# Patient Record
Sex: Male | Born: 1950 | Race: Black or African American | Hispanic: No | Smoking: Former smoker
Health system: Southern US, Community
[De-identification: ages and names within clinical notes are randomized; demographics above are authoritative.]

## PROBLEM LIST (undated history)

## (undated) DIAGNOSIS — D649 Anemia, unspecified: Secondary | ICD-10-CM

## (undated) DIAGNOSIS — C801 Malignant (primary) neoplasm, unspecified: Secondary | ICD-10-CM

## (undated) DIAGNOSIS — C44529 Squamous cell carcinoma of skin of other part of trunk: Principal | ICD-10-CM

## (undated) DIAGNOSIS — J449 Chronic obstructive pulmonary disease, unspecified: Secondary | ICD-10-CM

## (undated) DIAGNOSIS — IMO0001 Reserved for inherently not codable concepts without codable children: Secondary | ICD-10-CM

## (undated) DIAGNOSIS — K0889 Other specified disorders of teeth and supporting structures: Secondary | ICD-10-CM

## (undated) DIAGNOSIS — K219 Gastro-esophageal reflux disease without esophagitis: Secondary | ICD-10-CM

## (undated) DIAGNOSIS — I10 Essential (primary) hypertension: Secondary | ICD-10-CM

## (undated) DIAGNOSIS — N189 Chronic kidney disease, unspecified: Secondary | ICD-10-CM

## (undated) DIAGNOSIS — I509 Heart failure, unspecified: Secondary | ICD-10-CM

## (undated) DIAGNOSIS — G709 Myoneural disorder, unspecified: Secondary | ICD-10-CM

## (undated) DIAGNOSIS — K7460 Unspecified cirrhosis of liver: Secondary | ICD-10-CM

## (undated) DIAGNOSIS — M7989 Other specified soft tissue disorders: Secondary | ICD-10-CM

## (undated) HISTORY — PX: APPENDECTOMY: SHX54

## (undated) HISTORY — DX: Gastro-esophageal reflux disease without esophagitis: K21.9

## (undated) HISTORY — DX: Squamous cell carcinoma of skin of other part of trunk: C44.529

## (undated) HISTORY — PX: TONSILLECTOMY: SUR1361

## (undated) HISTORY — DX: Malignant (primary) neoplasm, unspecified: C80.1

---

## 2005-04-21 ENCOUNTER — Emergency Department: Payer: Self-pay | Admitting: Emergency Medicine

## 2005-04-21 ENCOUNTER — Other Ambulatory Visit: Payer: Self-pay

## 2006-03-26 ENCOUNTER — Emergency Department: Payer: Self-pay | Admitting: General Practice

## 2006-04-24 ENCOUNTER — Other Ambulatory Visit: Payer: Self-pay

## 2006-04-24 ENCOUNTER — Inpatient Hospital Stay: Payer: Self-pay | Admitting: Internal Medicine

## 2006-04-25 ENCOUNTER — Other Ambulatory Visit: Payer: Self-pay

## 2006-10-09 ENCOUNTER — Emergency Department: Payer: Self-pay | Admitting: Emergency Medicine

## 2007-05-27 ENCOUNTER — Emergency Department: Payer: Self-pay | Admitting: Emergency Medicine

## 2007-06-18 ENCOUNTER — Other Ambulatory Visit: Payer: Self-pay

## 2007-06-18 ENCOUNTER — Emergency Department: Payer: Self-pay | Admitting: Internal Medicine

## 2008-07-05 ENCOUNTER — Emergency Department: Payer: Self-pay | Admitting: Emergency Medicine

## 2008-12-22 ENCOUNTER — Emergency Department: Payer: Self-pay | Admitting: Emergency Medicine

## 2009-03-11 ENCOUNTER — Emergency Department: Payer: Self-pay | Admitting: Emergency Medicine

## 2010-11-12 ENCOUNTER — Emergency Department: Payer: Self-pay | Admitting: Emergency Medicine

## 2010-11-25 ENCOUNTER — Emergency Department: Payer: Self-pay | Admitting: Emergency Medicine

## 2011-07-03 ENCOUNTER — Encounter (HOSPITAL_COMMUNITY): Payer: Self-pay

## 2011-07-03 ENCOUNTER — Other Ambulatory Visit: Payer: Self-pay

## 2011-07-03 ENCOUNTER — Emergency Department (HOSPITAL_COMMUNITY): Payer: Self-pay

## 2011-07-03 ENCOUNTER — Emergency Department (HOSPITAL_COMMUNITY)
Admission: EM | Admit: 2011-07-03 | Discharge: 2011-07-03 | Disposition: A | Discharge status: Home / Self Care | Payer: Self-pay | Attending: Emergency Medicine | Admitting: Emergency Medicine

## 2011-07-03 DIAGNOSIS — I10 Essential (primary) hypertension: Secondary | ICD-10-CM | POA: Insufficient documentation

## 2011-07-03 DIAGNOSIS — E119 Type 2 diabetes mellitus without complications: Secondary | ICD-10-CM | POA: Insufficient documentation

## 2011-07-03 DIAGNOSIS — Z7982 Long term (current) use of aspirin: Secondary | ICD-10-CM | POA: Insufficient documentation

## 2011-07-03 DIAGNOSIS — M5380 Other specified dorsopathies, site unspecified: Secondary | ICD-10-CM | POA: Insufficient documentation

## 2011-07-03 DIAGNOSIS — M546 Pain in thoracic spine: Secondary | ICD-10-CM | POA: Insufficient documentation

## 2011-07-03 DIAGNOSIS — Z79899 Other long term (current) drug therapy: Secondary | ICD-10-CM | POA: Insufficient documentation

## 2011-07-03 DIAGNOSIS — R079 Chest pain, unspecified: Secondary | ICD-10-CM | POA: Insufficient documentation

## 2011-07-03 HISTORY — DX: Essential (primary) hypertension: I10

## 2011-07-03 LAB — URINALYSIS, ROUTINE W REFLEX MICROSCOPIC
Ketones, ur: NEGATIVE mg/dL
Leukocytes, UA: NEGATIVE
Nitrite: NEGATIVE
Protein, ur: 100 mg/dL — AB

## 2011-07-03 LAB — DIFFERENTIAL
Basophils Absolute: 0 10*3/uL (ref 0.0–0.1)
Eosinophils Absolute: 0.2 10*3/uL (ref 0.0–0.7)
Eosinophils Relative: 3 % (ref 0–5)
Lymphocytes Relative: 24 % (ref 12–46)
Neutrophils Relative %: 59 % (ref 43–77)

## 2011-07-03 LAB — BASIC METABOLIC PANEL
BUN: 14 mg/dL (ref 6–23)
Calcium: 9.7 mg/dL (ref 8.4–10.5)
Creatinine, Ser: 0.98 mg/dL (ref 0.50–1.35)
GFR calc non Af Amer: 88 mL/min — ABNORMAL LOW (ref >90)
Glucose, Bld: 156 mg/dL — ABNORMAL HIGH (ref 70–99)

## 2011-07-03 LAB — CBC
MCV: 91.7 fL (ref 78.0–100.0)
Platelets: 156 10*3/uL (ref 150–400)
RBC: 4.47 MIL/uL (ref 4.22–5.81)
RDW: 12.3 % (ref 11.5–15.5)
WBC: 7.2 10*3/uL (ref 4.0–10.5)

## 2011-07-03 LAB — PROTIME-INR: Prothrombin Time: 13.9 seconds (ref 11.6–15.2)

## 2011-07-03 MED ORDER — MORPHINE SULFATE 4 MG/ML IJ SOLN
4.0000 mg | Freq: Once | INTRAMUSCULAR | Status: AC
  Administered 2011-07-03: 4 mg via INTRAVENOUS
  Filled 2011-07-03: qty 1

## 2011-07-03 MED ORDER — ASPIRIN 81 MG PO CHEW
324.0000 mg | CHEWABLE_TABLET | Freq: Once | ORAL | Status: AC
  Administered 2011-07-03: 324 mg via ORAL
  Filled 2011-07-03: qty 4

## 2011-07-03 MED ORDER — IOHEXOL 350 MG/ML SOLN
100.0000 mL | Freq: Once | INTRAVENOUS | Status: AC | PRN
  Administered 2011-07-03: 100 mL via INTRAVENOUS

## 2011-07-03 MED ORDER — OXYCODONE-ACETAMINOPHEN 5-325 MG PO TABS: 1.0000 | ORAL_TABLET | Freq: Four times a day (QID) | ORAL | Status: AC | PRN

## 2011-07-03 MED ORDER — NAPROXEN 500 MG PO TABS: 500.0000 mg | ORAL_TABLET | Freq: Two times a day (BID) | ORAL | Status: AC

## 2011-07-03 NOTE — ED Notes (Signed)
Patient states that he had sudden onset of CP today while bending over to pick up a large, heavy bag.  Pt states that CP is worse with movement and deep inspiration.  PT denies any n/v, SOB, Oppenheimer stools or any other complaints at this time.  Pt is alert and oriented.  Lung sounds are clear, BS are present x 4 quadrants.  No distress noted at this time.

## 2011-07-03 NOTE — ED Provider Notes (Signed)
History     CSN: [id number]  Arrival date & time 07/03/11  1057   First MD Initiated Contact with Patient 07/03/11 1059      Chief Complaint  Patient presents with  . Chest Pain    (Consider location/radiation/quality/duration/timing/severity/associated sxs/prior treatment) HPI Comments: Noncompliant with hypertensive and diabetic medications. 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 Cleaned up against the wall and developed chest pain radiating to the back. Improves with rest.  Patient is a 61 y.o. male presenting with chest pain. The history is provided by the patient. No language interpreter was used.  Chest Pain The chest pain began 1 - 2 hours ago. Chest pain occurs constantly. The chest pain is worsening. The pain is associated with breathing and exertion. The severity of the pain is moderate. The quality of the pain is described as sharp and aching. The pain radiates to the upper back. Chest pain is worsened by deep breathing and certain positions. Pertinent negatives for primary symptoms include no fever, no fatigue, no shortness of breath, no cough, no palpitations, no abdominal pain, no nausea, no vomiting and no dizziness.  Pertinent negatives for associated symptoms include no diaphoresis, no numbness, no orthopnea, no paroxysmal nocturnal dyspnea and no weakness. He tried nitroglycerin and aspirin for the symptoms. Risk factors include male gender.  His past medical history is significant for diabetes and hypertension.     Past Medical History  Diagnosis Date  . Hypertension   . Diabetes mellitus     History reviewed. No pertinent past surgical history.  History reviewed. No pertinent family history.  History  Substance Use Topics  . Smoking status: Current Everyday Smoker  . Smokeless tobacco: Not on file  . Alcohol Use: Yes      Review of Systems  Constitutional: Negative for fever, diaphoresis, activity change, appetite change and  fatigue.  HENT: Negative for congestion, sore throat, rhinorrhea, neck pain and neck stiffness.   Respiratory: Negative for cough and shortness of breath.   Cardiovascular: Positive for chest pain. Negative for palpitations and orthopnea.  Gastrointestinal: Negative for nausea, vomiting and abdominal pain.  Genitourinary: Negative for dysuria, urgency, frequency and flank pain.  Musculoskeletal: Positive for back pain. Negative for myalgias and arthralgias.  Neurological: Negative for dizziness, weakness, light-headedness, numbness and headaches.  All other systems reviewed and are negative.    Allergies  Review of patient's allergies indicates no known allergies.  Home Medications   Current Outpatient Rx  Name Route Sig Dispense Refill  . ASPIRIN EC 81 MG PO TBEC Oral Take 81 mg by mouth every other day.    Marland Kitchen NAPROXEN 500 MG PO TABS Oral Take 1 tablet (500 mg total) by mouth 2 (two) times daily. 30 tablet 0  . OXYCODONE-ACETAMINOPHEN 5-325 MG PO TABS Oral Take 1-2 tablets by mouth every 6 (six) hours as needed for pain. 20 tablet 0    BP 185/104  Pulse 60  Temp(Src) 98.4 F (36.9 C) (Oral)  Resp 22  SpO2 95%  Physical Exam  Nursing note and vitals reviewed. Constitutional: He is oriented to person, place, and time. He appears well-developed and well-nourished.  HENT:  Head: Normocephalic and atraumatic.  Mouth/Throat: Oropharynx is clear and moist.  Eyes: Conjunctivae and EOM are normal. Pupils are equal, round, and reactive to light.  Neck: Normal range of motion. Neck supple.  Cardiovascular: Normal rate, regular rhythm, normal heart sounds and intact distal pulses.  Exam reveals no gallop and  no friction rub.   No murmur heard. Pulmonary/Chest: Effort normal and breath sounds normal. No respiratory distress. He exhibits tenderness (R chest wall over pectoral muscles).  Abdominal: Soft. Bowel sounds are normal. There is no tenderness.  Musculoskeletal: Normal range of  motion.       Thoracic back: He exhibits tenderness, pain and spasm. He exhibits no bony tenderness.  Neurological: He is alert and oriented to person, place, and time. No cranial nerve deficit.  Skin: Skin is warm and dry.    ED Course  Procedures (including critical care time)   Date: 07/03/2011  Rate: 73  Rhythm: normal sinus rhythm  QRS Axis: normal  Intervals: normal  ST/T Wave abnormalities: normal  Conduction Disutrbances:none  Narrative Interpretation:   Old EKG Reviewed: none available  Labs Reviewed  DIFFERENTIAL - Abnormal; Notable for the following:    Monocytes Relative 14 (*)    All other components within normal limits  URINALYSIS, ROUTINE W REFLEX MICROSCOPIC - Abnormal; Notable for the following:    Color, Urine AMBER (*) BIOCHEMICALS MAY BE AFFECTED BY COLOR   Glucose, UA 100 (*)    Bilirubin Urine SMALL (*)    Protein, ur 100 (*)    Urobilinogen, UA 2.0 (*)    All other components within normal limits  BASIC METABOLIC PANEL - Abnormal; Notable for the following:    Sodium 134 (*)    Glucose, Bld 156 (*)    GFR calc non Af Amer 88 (*)    All other components within normal limits  CBC  PROTIME-INR  TROPONIN I  URINE MICROSCOPIC-ADD ON  TROPONIN I   Dg Chest 2 View  07/03/2011  *RADIOLOGY REPORT*  Clinical Data: Chest pain, back pain, cough.  CHEST - 2 VIEW  Comparison: None.  Findings: Slight hyperinflation of the lungs. Heart and mediastinal contours are within normal limits.  No focal opacities or effusions.  No acute bony abnormality.  IMPRESSION: No active cardiopulmonary disease.  Original Report Authenticated By: Raelyn Number, M.D.   Ct Angio Chest W/cm &/or Wo Cm  07/03/2011  *RADIOLOGY REPORT*  Clinical Data: Chest pain  CT ANGIOGRAPHY CHEST  Technique:  Multidetector CT imaging of the chest using the standard protocol during bolus administration of intravenous contrast. Multiplanar reconstructed images including MIPs were obtained and reviewed  to evaluate the vascular anatomy.  Contrast: 149mL OMNIPAQUE IOHEXOL 350 MG/ML IV SOLN  Comparison: None.  Findings: There is good contrast opacification of the pulmonary artery branches.  No discrete filling defect to suggest acute PE.Adequate contrast opacification of the thoracic aorta with no evidence of dissection, aneurysm, or stenosis. No proximal brachiocephalic arterial occlusive disease.  There is a common origin of the right innominate and left common carotid arteries, an anatomic variant.  No pleural or pericardial effusion.  No hilar or mediastinal adenopathy.  Visualized portions of upper abdomen unremarkable. Minimal linear scarring or subsegmental atelectasis in the inferior lingula. 8 mm nonspecific cavitary or cystic lesion in the lateral basilar segment right lower lobe.  Lungs are otherwise     clear.  Minimal spondylitic changes in the mid thoracic spine.  IMPRESSION:  1. No PE or thoracic aortic dissection. 2.  8 mm cavitary or cystic lesion in the lateral right lower lobe. If the patient is at high risk for bronchogenic carcinoma, follow- up chest CT at 3-6 months is recommended.  If the patient is at low risk for bronchogenic carcinoma, follow-up chest CT at 6-12 months is recommended.  This recommendation follows the consensus statement: Guidelines for Management of Small Pulmonary Nodules Detected on CT Scans: A Statement from the [employer] as published in Radiology 2005; 237:395-400.  Original Report Authenticated By: Dillard Cannon III, M.D.     1. Chest pain       MDM  Chest pain likely musculoskeletal in origin. Workup was unremarkable. Troponin was performed. The second troponin is pending at the time of this dictation. If it is negative he'll be discharged home to all primary care physician. My colleague Dr. Lita Mains assumed care. No PE or dissection. A low concern about ACS given if the delta troponin is negative        Trisha Mangle, MD 07/03/11 [phone]

## 2011-07-03 NOTE — Discharge Instructions (Signed)
Chest Pain (Nonspecific) It is often hard to give a specific diagnosis for the cause of chest pain. There is always a chance that your pain could be related to something serious, such as a heart attack or a blood clot in the lungs. You need to follow up with your caregiver for further evaluation. CAUSES   Heartburn.   Pneumonia or bronchitis.   Anxiety or stress.   Inflammation around your heart (pericarditis) or lung (pleuritis or pleurisy).   A blood clot in the lung.   A collapsed lung (pneumothorax). It can develop suddenly on its own (spontaneous pneumothorax) or from injury (trauma) to the chest.   Shingles infection (herpes zoster virus).  The chest wall is composed of bones, muscles, and cartilage. Any of these can be the source of the pain.  The bones can be bruised by injury.   The muscles or cartilage can be strained by coughing or overwork.   The cartilage can be affected by inflammation and become sore (costochondritis).  DIAGNOSIS  Lab tests or other studies, such as X-rays, electrocardiography, stress testing, or cardiac imaging, may be needed to find the cause of your pain.  TREATMENT   Treatment depends on what may be causing your chest pain. Treatment may include:   Acid blockers for heartburn.   Anti-inflammatory medicine.   Pain medicine for inflammatory conditions.   Antibiotics if an infection is present.   You may be advised to change lifestyle habits. This includes stopping smoking and avoiding alcohol, caffeine, and chocolate.   You may be advised to keep your head raised (elevated) when sleeping. This reduces the chance of acid going backward from your stomach into your esophagus.   Most of the time, nonspecific chest pain will improve within 2 to 3 days with rest and mild pain medicine.  HOME CARE INSTRUCTIONS   If antibiotics were prescribed, take your antibiotics as directed. Finish them even if you start to feel better.   For the next few  days, avoid physical activities that bring on chest pain. Continue physical activities as directed.   Do not smoke.   Avoid drinking alcohol.   Only take over-the-counter or prescription medicine for pain, discomfort, or fever as directed by your caregiver.   Follow your caregiver's suggestions for further testing if your chest pain does not go away.   Keep any follow-up appointments you made. If you do not go to an appointment, you could develop lasting (chronic) problems with pain. If there is any problem keeping an appointment, you must call to reschedule.  SEEK MEDICAL CARE IF:   You think you are having problems from the medicine you are taking. Read your medicine instructions carefully.   Your chest pain does not go away, even after treatment.   You develop a rash with blisters on your chest.  SEEK IMMEDIATE MEDICAL CARE IF:   You have increased chest pain or pain that spreads to your arm, neck, jaw, back, or abdomen.   You develop shortness of breath, an increasing cough, or you are coughing up blood.   You have severe back or abdominal pain, feel nauseous, or vomit.   You develop severe weakness, fainting, or chills.   You have a fever.  THIS IS AN EMERGENCY. Do not wait to see if the pain will go away. Get medical help at once. Call your local emergency services (911 in [place] Do not drive yourself to the hospital. MAKE SURE YOU:   Understand these instructions.     Will watch your condition.   Will get help right away if you are not doing well or get worse.  Document Released: 01/03/2005 Document Revised: 03/15/2011 Document Reviewed: 10/30/2007 [employer] Patient Information 2012 [employer]. 

## 2011-07-03 NOTE — ED Notes (Signed)
Patient transported to X-ray 

## 2011-07-03 NOTE — ED Notes (Signed)
Patient transported to CT 

## 2011-07-03 NOTE — ED Notes (Signed)
Returned from CT.

## 2011-07-03 NOTE — ED Notes (Signed)
Given sl ntg x 1. And 324 mg asa only thing that helped was rest

## 2011-07-03 NOTE — ED Notes (Signed)
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

## 2011-07-03 NOTE — ED Notes (Signed)
MD at bedside. 

## 2011-07-03 NOTE — ED Notes (Signed)
Patient has returned from radiology.  

## 2012-12-18 ENCOUNTER — Emergency Department: Payer: Self-pay | Admitting: Emergency Medicine

## 2012-12-18 LAB — URINALYSIS, COMPLETE
Blood: NEGATIVE
Glucose,UR: >=500 mg/dL (ref 0–75)
Ketone: NEGATIVE
Nitrite: NEGATIVE
RBC,UR: 15 /HPF (ref 0–5)
Specific Gravity: 1.027 (ref 1.003–1.030)

## 2012-12-18 LAB — BASIC METABOLIC PANEL
Anion Gap: 5 — ABNORMAL LOW (ref 7–16)
Calcium, Total: 9.7 mg/dL (ref 8.5–10.1)
Chloride: 95 mmol/L — ABNORMAL LOW (ref 98–107)
EGFR (Non-African Amer.): >60
Potassium: 4.2 mmol/L (ref 3.5–5.1)
Sodium: 127 mmol/L — ABNORMAL LOW (ref 136–145)

## 2013-02-08 ENCOUNTER — Inpatient Hospital Stay: Payer: Self-pay | Admitting: Internal Medicine

## 2013-02-08 LAB — TROPONIN I: Troponin-I: <0.02 ng/mL

## 2013-02-08 LAB — CBC
MCV: 94 fL (ref 80–100)
RBC: 4.28 10*6/uL — ABNORMAL LOW (ref 4.40–5.90)
RDW: 12.4 % (ref 11.5–14.5)
WBC: 9 10*3/uL (ref 3.8–10.6)

## 2013-02-08 LAB — URINALYSIS, COMPLETE
Bacteria: NONE SEEN
Blood: NEGATIVE
Glucose,UR: >=500 mg/dL (ref 0–75)
Ketone: NEGATIVE
Nitrite: NEGATIVE
Protein: 30
Squamous Epithelial: 2
WBC UR: 2 /HPF (ref 0–5)

## 2013-02-08 LAB — CK TOTAL AND CKMB (NOT AT ARMC)
CK, Total: 153 U/L (ref 35–232)
CK, Total: 102 U/L (ref 35–232)
CK-MB: 3.9 ng/mL — ABNORMAL HIGH (ref 0.5–3.6)

## 2013-02-08 LAB — DRUG SCREEN, URINE
Amphetamines, Ur Screen: NEGATIVE (ref ?–1000)
Cannabinoid 50 Ng, Ur ~~LOC~~: NEGATIVE (ref ?–50)
Cocaine Metabolite,Ur ~~LOC~~: POSITIVE (ref ?–300)
Methadone, Ur Screen: NEGATIVE (ref ?–300)
Opiate, Ur Screen: NEGATIVE (ref ?–300)

## 2013-02-08 LAB — COMPREHENSIVE METABOLIC PANEL
SGOT(AST): 95 U/L — ABNORMAL HIGH (ref 15–37)
SGPT (ALT): 79 U/L — ABNORMAL HIGH (ref 12–78)
Total Protein: 8.9 g/dL — ABNORMAL HIGH (ref 6.4–8.2)

## 2013-02-08 LAB — PROTIME-INR: INR: 1.1

## 2013-02-08 LAB — ETHANOL: Ethanol: 230 mg/dL

## 2013-02-09 LAB — BASIC METABOLIC PANEL
Anion Gap: 3 — ABNORMAL LOW (ref 7–16)
Chloride: 99 mmol/L (ref 98–107)
Co2: 27 mmol/L (ref 21–32)
Potassium: 4.2 mmol/L (ref 3.5–5.1)

## 2013-02-09 LAB — CBC WITH DIFFERENTIAL/PLATELET
Basophil #: 0 10*3/uL (ref 0.0–0.1)
Eosinophil #: 0 10*3/uL (ref 0.0–0.7)
Eosinophil %: 0 %
HCT: 34.1 % — ABNORMAL LOW (ref 40.0–52.0)
HGB: 11.9 g/dL — ABNORMAL LOW (ref 13.0–18.0)
Lymphocyte #: 0.9 10*3/uL — ABNORMAL LOW (ref 1.0–3.6)
Lymphocyte %: 9.4 %
MCHC: 35 g/dL (ref 32.0–36.0)
MCV: 92 fL (ref 80–100)
Monocyte #: 1.1 x10 3/mm — ABNORMAL HIGH (ref 0.2–1.0)
Monocyte %: 12 %
Neutrophil #: 7.1 10*3/uL — ABNORMAL HIGH (ref 1.4–6.5)
Platelet: 184 10*3/uL (ref 150–440)
RDW: 12.4 % (ref 11.5–14.5)

## 2013-02-09 LAB — CK TOTAL AND CKMB (NOT AT ARMC)
CK, Total: 80 U/L (ref 35–232)
CK-MB: 1.7 ng/mL (ref 0.5–3.6)

## 2013-02-09 LAB — TROPONIN I: Troponin-I: <0.02 ng/mL

## 2013-04-22 ENCOUNTER — Ambulatory Visit: Payer: Self-pay

## 2013-05-05 ENCOUNTER — Other Ambulatory Visit: Payer: Self-pay

## 2013-05-05 LAB — CBC WITH DIFFERENTIAL/PLATELET
Basophil #: 0.1 10*3/uL (ref 0.0–0.1)
Basophil %: 1.1 %
Eosinophil #: 0.3 10*3/uL (ref 0.0–0.7)
Eosinophil %: 4.1 %
HCT: 42.2 % (ref 40.0–52.0)
HGB: 14.3 g/dL (ref 13.0–18.0)
LYMPHS ABS: 1.6 10*3/uL (ref 1.0–3.6)
LYMPHS PCT: 24.8 %
MCH: 31.3 pg (ref 26.0–34.0)
MCHC: 33.9 g/dL (ref 32.0–36.0)
MCV: 92 fL (ref 80–100)
MONO ABS: 1 x10 3/mm (ref 0.2–1.0)
Monocyte %: 15.2 %
Neutrophil #: 3.6 10*3/uL (ref 1.4–6.5)
Neutrophil %: 54.8 %
Platelet: 139 10*3/uL — ABNORMAL LOW (ref 150–440)
RBC: 4.58 10*6/uL (ref 4.40–5.90)
RDW: 13.4 % (ref 11.5–14.5)
WBC: 6.5 10*3/uL (ref 3.8–10.6)

## 2013-05-05 LAB — COMPREHENSIVE METABOLIC PANEL
Albumin: 2.9 g/dL — ABNORMAL LOW (ref 3.4–5.0)
Alkaline Phosphatase: 138 U/L — ABNORMAL HIGH
Anion Gap: 5 — ABNORMAL LOW (ref 7–16)
BUN: 13 mg/dL (ref 7–18)
Bilirubin,Total: 0.8 mg/dL (ref 0.2–1.0)
Calcium, Total: 9 mg/dL (ref 8.5–10.1)
Chloride: 96 mmol/L — ABNORMAL LOW (ref 98–107)
Co2: 26 mmol/L (ref 21–32)
Creatinine: 1.29 mg/dL (ref 0.60–1.30)
EGFR (African American): >60
EGFR (Non-African Amer.): 60 — ABNORMAL LOW
Glucose: 338 mg/dL — ABNORMAL HIGH (ref 65–99)
Osmolality: 269 (ref 275–301)
Potassium: 4 mmol/L (ref 3.5–5.1)
SGOT(AST): 116 U/L — ABNORMAL HIGH (ref 15–37)
SGPT (ALT): 118 U/L — ABNORMAL HIGH (ref 12–78)
Sodium: 127 mmol/L — ABNORMAL LOW (ref 136–145)
Total Protein: 8.8 g/dL — ABNORMAL HIGH (ref 6.4–8.2)

## 2013-05-05 LAB — PROTIME-INR
INR: 1.1
Prothrombin Time: 13.9 secs (ref 11.5–14.7)

## 2013-09-08 ENCOUNTER — Observation Stay: Payer: Self-pay | Admitting: Internal Medicine

## 2013-09-08 DIAGNOSIS — R079 Chest pain, unspecified: Secondary | ICD-10-CM

## 2013-09-08 LAB — APTT: Activated PTT: 30.5 secs (ref 23.6–35.9)

## 2013-09-08 LAB — CBC
HCT: 37 % — ABNORMAL LOW (ref 40.0–52.0)
HGB: 12.4 g/dL — ABNORMAL LOW (ref 13.0–18.0)
MCH: 31.8 pg (ref 26.0–34.0)
MCHC: 33.6 g/dL (ref 32.0–36.0)
MCV: 95 fL (ref 80–100)
Platelet: 158 10*3/uL (ref 150–440)
RBC: 3.91 10*6/uL — ABNORMAL LOW (ref 4.40–5.90)
RDW: 12.4 % (ref 11.5–14.5)
WBC: 8.4 10*3/uL (ref 3.8–10.6)

## 2013-09-08 LAB — URINALYSIS, COMPLETE
Bilirubin,UR: NEGATIVE
Blood: NEGATIVE
Glucose,UR: >=500 mg/dL (ref 0–75)
Ketone: NEGATIVE
NITRITE: NEGATIVE
Ph: 5 (ref 4.5–8.0)
Protein: NEGATIVE
RBC,UR: 40 /HPF (ref 0–5)
Specific Gravity: 1.027 (ref 1.003–1.030)
Squamous Epithelial: NONE SEEN

## 2013-09-08 LAB — TROPONIN I: Troponin-I: <0.02 ng/mL

## 2013-09-08 LAB — COMPREHENSIVE METABOLIC PANEL
ALBUMIN: 2.9 g/dL — AB (ref 3.4–5.0)
Alkaline Phosphatase: 115 U/L
Anion Gap: 9 (ref 7–16)
BILIRUBIN TOTAL: 0.8 mg/dL (ref 0.2–1.0)
BUN: 22 mg/dL — ABNORMAL HIGH (ref 7–18)
CALCIUM: 9 mg/dL (ref 8.5–10.1)
CREATININE: 1.2 mg/dL (ref 0.60–1.30)
Chloride: 92 mmol/L — ABNORMAL LOW (ref 98–107)
Co2: 26 mmol/L (ref 21–32)
EGFR (Non-African Amer.): >60
Glucose: 606 mg/dL (ref 65–99)
OSMOLALITY: 287 (ref 275–301)
POTASSIUM: 4.4 mmol/L (ref 3.5–5.1)
SGOT(AST): 77 U/L — ABNORMAL HIGH (ref 15–37)
SGPT (ALT): 110 U/L — ABNORMAL HIGH (ref 12–78)
SODIUM: 127 mmol/L — AB (ref 136–145)
Total Protein: 8.5 g/dL — ABNORMAL HIGH (ref 6.4–8.2)

## 2013-09-08 LAB — DRUG SCREEN, URINE
Amphetamines, Ur Screen: NEGATIVE (ref ?–1000)
Barbiturates, Ur Screen: NEGATIVE (ref ?–200)
Benzodiazepine, Ur Scrn: NEGATIVE (ref ?–200)
CANNABINOID 50 NG, UR ~~LOC~~: NEGATIVE (ref ?–50)
COCAINE METABOLITE, UR ~~LOC~~: NEGATIVE (ref ?–300)
MDMA (ECSTASY) UR SCREEN: NEGATIVE (ref ?–500)
METHADONE, UR SCREEN: NEGATIVE (ref ?–300)
OPIATE, UR SCREEN: NEGATIVE (ref ?–300)
Phencyclidine (PCP) Ur S: NEGATIVE (ref ?–25)
Tricyclic, Ur Screen: NEGATIVE (ref ?–1000)

## 2013-09-08 LAB — PROTIME-INR
INR: 1.1
Prothrombin Time: 14.2 secs (ref 11.5–14.7)

## 2013-09-08 LAB — CK TOTAL AND CKMB (NOT AT ARMC)
CK, TOTAL: 112 U/L
CK, Total: 135 U/L
CK, Total: 85 U/L
CK-MB: 3.9 ng/mL — ABNORMAL HIGH (ref 0.5–3.6)
CK-MB: 3 ng/mL (ref 0.5–3.6)
CK-MB: 2.7 ng/mL (ref 0.5–3.6)

## 2013-09-08 LAB — LIPASE, BLOOD
LIPASE: 486 U/L — AB (ref 73–393)
LIPASE: 243 U/L (ref 73–393)

## 2013-09-08 LAB — SEDIMENTATION RATE: Erythrocyte Sed Rate: 29 mm/hr — ABNORMAL HIGH (ref 0–20)

## 2013-09-09 LAB — CBC WITH DIFFERENTIAL/PLATELET
Basophil #: 0.1 10*3/uL (ref 0.0–0.1)
Basophil %: 0.6 %
EOS PCT: 1.8 %
Eosinophil #: 0.2 10*3/uL (ref 0.0–0.7)
HCT: 28.4 % — AB (ref 40.0–52.0)
HGB: 9.7 g/dL — AB (ref 13.0–18.0)
Lymphocyte #: 2.9 10*3/uL (ref 1.0–3.6)
Lymphocyte %: 32.7 %
MCH: 31.8 pg (ref 26.0–34.0)
MCHC: 34 g/dL (ref 32.0–36.0)
MCV: 94 fL (ref 80–100)
MONO ABS: 1.2 x10 3/mm — AB (ref 0.2–1.0)
MONOS PCT: 13 %
Neutrophil #: 4.6 10*3/uL (ref 1.4–6.5)
Neutrophil %: 51.9 %
PLATELETS: 136 10*3/uL — AB (ref 150–440)
RBC: 3.04 10*6/uL — AB (ref 4.40–5.90)
RDW: 12.6 % (ref 11.5–14.5)
WBC: 8.8 10*3/uL (ref 3.8–10.6)

## 2013-09-09 LAB — LIPID PANEL
CHOLESTEROL: 88 mg/dL (ref 0–200)
HDL Cholesterol: 17 mg/dL — ABNORMAL LOW (ref 40–60)
LDL CHOLESTEROL, CALC: 37 mg/dL (ref 0–100)
TRIGLYCERIDES: 168 mg/dL (ref 0–200)
VLDL Cholesterol, Calc: 34 mg/dL (ref 5–40)

## 2013-09-09 LAB — BASIC METABOLIC PANEL
ANION GAP: 5 — AB (ref 7–16)
BUN: 37 mg/dL — ABNORMAL HIGH (ref 7–18)
CHLORIDE: 101 mmol/L (ref 98–107)
CREATININE: 1.12 mg/dL (ref 0.60–1.30)
Calcium, Total: 8.6 mg/dL (ref 8.5–10.1)
Co2: 26 mmol/L (ref 21–32)
Glucose: 222 mg/dL — ABNORMAL HIGH (ref 65–99)
Osmolality: 280 (ref 275–301)
POTASSIUM: 3.5 mmol/L (ref 3.5–5.1)
Sodium: 132 mmol/L — ABNORMAL LOW (ref 136–145)

## 2013-09-09 LAB — URINE CULTURE

## 2013-09-09 LAB — LIPASE, BLOOD: Lipase: 203 U/L (ref 73–393)

## 2013-09-09 LAB — HEMOGLOBIN A1C: Hemoglobin A1C: 13.2 % — ABNORMAL HIGH (ref 4.2–6.3)

## 2014-01-29 ENCOUNTER — Emergency Department: Payer: Self-pay | Admitting: Emergency Medicine

## 2014-05-04 ENCOUNTER — Emergency Department: Payer: Self-pay | Admitting: Emergency Medicine

## 2014-05-22 ENCOUNTER — Emergency Department: Payer: Self-pay | Admitting: Internal Medicine

## 2014-05-27 ENCOUNTER — Emergency Department: Payer: Self-pay | Admitting: Emergency Medicine

## 2014-07-30 NOTE — Discharge Summary (Signed)
PATIENT NAME:  [name], [name] MR#:  [id number] DATE OF BIRTH:  01/27/1951  DATE OF ADMISSION:  02/08/2013 DATE OF DISCHARGE:  02/09/2013  ADMISSION DIAGNOSES:   1.  Chest pain.  2.  Community-acquired pneumonia.    DISCHARGE DIAGNOSES: 1.  Chest pain secondary to community-acquired pneumonia.  2.  Community-acquired pneumonia.  3.  Polysubstance abuse including cocaine.  4.  Alcohol abuse.  5.  Tobacco dependence.  6.  Diabetes.  7.  Elevated blood pressure with no diagnosis of hypertension.   CONSULTATIONS: None.   LABORATORY DATA: Troponin were negative x 3. White blood cells 9.1, hemoglobin 12, hematocrit 35, platelets 184. Sodium 129, potassium 4.2, chloride 99, bicarbonate 27, BUN 11, creatinine 0.95, glucose 288. Blood cultures were negative to date.   Chest x-ray was consistent with a left upper lobe and lingular pneumonia.   HOSPITAL COURSE: A 64 year old male who presented with chest pain, found to have community-acquired pneumonia. For further details please refer to the H and P.  1.  Chest pain. The patient's chest pain was secondary to pneumonia. His chest pain was on the left side. Telemetry was negative as well as cardiac enzymes.  2.  Community-acquired pneumonia. The patient will be discharged on Levaquin. It is absolutely imperative that the patient follow up with [facility] and have a repeat CT scan to evaluate for underlying lung mass after resolved left lower lobe pneumonia due to the patient's history of smoking. On the initial CT scan performed during admission, there question of pleural based density in the left upper lobe, which could represent pneumonia without air bronchogram, but underlying malignancy was no excluded.  3.  Alcohol abuse. The patient was on CIWA protocol,  uneventful detox.   4. Tobacco abuse.  The patient was counseled for 4 minutes to stop smoking.  The patient seems reluctant at this time to stop smoking, but we did discharge him with  nicotine patches.  5.  Diabetes. I started glipizide. I do not think that metformin is a good drug for this patient. He was still drinking alcohol.  6.  Elevated blood pressure with no diagnosis of hypertension. The patient was started on and Norvasc. No beta blocker due to his history of cocaine use.   DISCHARGE MEDICATIONS: 1.  Glipizide 10 mg b.i.d.  2.  Nicotine patch 21 mg per 24 hours.  3.  Lisinopril 20 mg daily.  4.  Levaquin 750 mg x 7 days.   DISCHARGE DIET: Low sodium, ADA diet.   DISCHARGE ACTIVITY: As tolerated.   DISCHARGE FOLLOW-UP: [facility]. Once again, it is imperative the patient had a repeat CT scan in about six weeks to look for underlying lung mass. The patient was medically stable for discharge.   TIME SPENT: Approximately 40   ____________________________ Chucky Homes P. Benjie Karvonen, MD spm:cc D: 02/09/2013 19:28:01 ET T: 02/10/2013 01:31:51 ET JOB#: [id number]  cc: Ahmari Garton P. Benjie Karvonen, MD, <Dictator> [facility] Jadira Nierman P Araly Kaas MD ELECTRONICALLY SIGNED 02/10/2013 15:10

## 2014-07-30 NOTE — H&P (Signed)
PATIENT NAME:  [name], [name] MR#:  [id number] DATE OF BIRTH:  10-26-1950  DATE OF ADMISSION:  02/08/2013  REFERRING PHYSICIAN: Dr. Jasmine December.   FAMILY PHYSICIAN: None.   REASON FOR ADMISSION: Chest pain with pneumonia.   HISTORY OF PRESENT ILLNESS: The patient is a 64 year old male with a history of polysubstance abuse including tobacco, alcohol and cocaine. Has a history of COPD, diabetes, hypertension, has had MRSA within the past year. He presents to the Emergency Room with left-sided chest pain after abusing alcohol and cocaine over the weekend. In the Emergency Room, the patient was somnolent with an alcohol level of 0.2. Chest x-ray revealed pneumonia. He continued to complain of left-sided chest pain. Has had no previous cardiac workup and is now admitted for further evaluation.   PAST MEDICAL HISTORY: 1.  Polysubstance abuse with alcohol, cocaine and tobacco.  2.  COPD.   3.  Type 2 diabetes.  4.  Benign hypertension.  5.  History of MRSA.    6.  History of GI bleed requiring surgical repair.   MEDICATIONS:  Metformin 500 mg p.o. b.i.d.   ALLERGIES: No known drug allergies.   SOCIAL HISTORY:  The patient has a long-standing history of polysubstance abuse. Last had alcohol this morning. Last cocaine he states was Friday.   FAMILY HISTORY: Positive for diabetes and coronary artery disease.   REVIEW OF SYSTEMS: CONSTITUTIONAL: No fever or change in weight.  EYES: No blurred or double vision. No glaucoma.  ENT: No tinnitus or hearing loss. No nasal discharge or bleeding. No difficulty swallowing.  RESPIRATORY: The patient has had cough but denies wheezing or hemoptysis. No painful respiration.  CARDIOVASCULAR: No orthopnea or palpitations. No syncope.  GASTROINTESTINAL: No nausea, vomiting or diarrhea. No abdominal pain. No change in bowel habits.  GENITOURINARY: No dysuria or hematuria. No incontinence.  ENDOCRINE: No polyuria or polydipsia. No heat or cold intolerance.   HEMATOLOGIC: The patient denies anemia, easy bruising or bleeding.  LYMPHATIC: No swollen glands.  MUSCULOSKELETAL: The patient denies pain in his neck, back, shoulders, knees or hips. No gout.  NEUROLOGIC: No numbness or migraines. Denies stroke or seizures.  PSYCHIATRIC: The patient denies anxiety, insomnia or depression.   PHYSICAL EXAMINATION: GENERAL: The patient is in no acute distress.  VITAL SIGNS: Remarkable for a blood pressure of 135/83, heart rate of 95, respiratory rate of 18, temperature of 97.9, sats 99% on 2 liters.  HEENT: Normocephalic, atraumatic. Pupils equally round and reactive to light and accommodation. Extraocular movements are intact. Sclerae are anicteric. Conjunctivae are clear. Oropharynx clear.  NECK: Supple without JVD or bruits. No adenopathy or thyromegaly is noted.  LUNGS: Reveal scattered rhonchi, more notable left greater than right. No wheezes or rales. No dullness. Respiratory effort is normal.  CARDIAC: Regular rate and rhythm with normal S1, S2. No significant rubs or gallops. There is a 2/6 systolic murmur noted throughout the precordium. PMI is nondisplaced. Chest wall is nontender.  ABDOMEN: Soft, nontender, with normoactive bowel sounds. No organomegaly or masses were appreciated. No hernias or bruits were noted.  EXTREMITIES: Without clubbing, cyanosis or edema. Pulses were 2+ bilaterally.  SKIN: Warm and dry without rash or lesions.  NEUROLOGIC: Cranial nerves II through XII grossly intact. Deep tendon reflexes were symmetric. Motor and sensory exams nonfocal.  PSYCHIATRIC: Revealed a patient who was alert and oriented to person, place and time. He was cooperative and used good judgment.   LABORATORY AND RADIOLOGICAL DATA: EKG revealed sinus rhythm with peaked Ts.  Chest x-ray revealed a left-sided pneumonia. Urine drug screen was positive for cocaine. Alcohol level was 0.23. Urine was negative. Troponin was less than 0.02. White count 9.0, with a  hemoglobin of 13.8. Glucose 331 with a BUN of 10, creatinine of 0.99 with a sodium of 131 and a potassium of 4.1.   ASSESSMENT: 1.  Chest pain worrisome for unstable angina.  2.  Pneumonia.  3.  Poorly controlled diabetes.  4.  Medical noncompliance.  5.  Polysubstance abuse.  6.  Acute alcohol intoxication.   PLAN: The patient will be admitted to the floor with telemetry. He will be started on aspirin, beta blockers, topical nitrates and Lovenox. We will follow serial cardiac enzymes and obtain an echocardiogram. For his pneumonia, he will be maintained on oxygen, Mucinex, DuoNeb SVNs with IV antibiotics. We will follow up a chest x-ray in the morning and wean oxygen as tolerated. We will begin CIWA protocol for his polysubstance abuse. NicoDerm patch because of his persistent smoking. Follow up routine labs in the morning. Clear liquid diet for now. Further treatment and evaluation will depend upon the patient's progress.   TOTAL TIME SPENT ON THIS PATIENT: 50 minutes.    ____________________________ Leonie Douglas Doy Hutching, MD jds:cs D: 02/08/2013 14:49:00 ET T: 02/08/2013 15:10:01 ET JOB#: [id number]  cc: Leonie Douglas. Doy Hutching, MD, <Dictator> Arles Rumbold Lennice Sites MD ELECTRONICALLY SIGNED 02/08/2013 15:35

## 2014-07-31 NOTE — H&P (Signed)
PATIENT NAME:  [name], [name] MR#:  [id number] DATE OF BIRTH:  [birth year]  DATE OF ADMISSION:  09/08/2013  PRIMARY CARE PHYSICIAN: Does not have a primary care physician and goes to the ER to get his medications.  REFERRING PHYSICIAN: Doren Custard A. Joni Fears, MD  REASON FOR ADMISSION: Chest pain.   HISTORY OF PRESENT ILLNESS: A 64 year old gentleman with polysubstance abuse history, tobacco abuse, cocaine, COPD, diabetes, hypertension, who presented to the Emergency Department complaining of chest pain that has been going on for 1 week. The patient states that the chest pain happens usually after he eats. It is epigastric and in the middle of the chest. The patient states that tonight it was worse, going to intensity of 9 out of 10. Right now, the patient states that the intensity is 6 out of 10, but it goes continuously. The patient does not have any radiation of the pain. No alleviating factors. No worsening factors other than food. Exercise does not alter the pain, but he is not very active anyway. He associates some mild shortness of breath, and the pain is described as sharp. The patient has not been taking any blood pressure medications or diabetes medication pills that he used to take for over a month. The patient again does not have a primary care physician. His first set of troponins is negative. His EKG did not show any significant ST depression or elevation, but he has very peaked T wave on lateral leads.    REVIEW OF SYSTEMS: A 12-system review of systems is done.  CONSTITUTIONAL: No fever, fatigue or weakness.  EYES: No blurry vision or double vision.  ENT: No difficulty swallowing or tinnitus.  RESPIRATORY: Positive occasional cough. The patient smokes 1 pack a day. He states he has not smoked in a week. No COPD exacerbation. No increased secretions. No significant wheezing at this moment.  CARDIOVASCULAR: Positive chest pain as mentioned above. Negative orthopnea. Negative edema. Negative  palpitations.  GASTROINTESTINAL: No nausea, vomiting, abdominal pain, constipation, diarrhea.  GENITOURINARY: No dysuria or hematuria.  HEMATOLOGIC AND LYMPHATIC: No anemia, easy bruising.  SKIN: No rashes, petechiae.  MUSCULOSKELETAL: No neck pain or back pain.  NEUROLOGIC: No numbness, tingling or CVA.  PSYCHIATRIC: No insomnia or depression.   PAST MEDICAL HISTORY:  1. Polysubstance abuse, alcohol abuse, cocaine abuse, tobacco abuse.  2. COPD.  3. Type 2 diabetes.  4. Noncompliance.  5. Hypertension.  6. History of GI bleeding.  7. History of MRSA infection.   MEDICATIONS: Glucotrol and lisinopril, unknown current doses. The patient has not taken any medications in over a month.   ALLERGIES: No known drug allergies.   SOCIAL HISTORY: The patient has a longstanding history of polysubstance abuse, including cocaine. The patient states cocaine was used last 6 months ago. He had alcohol in last couple of days, and he states that the last he smoked was a week ago.   FAMILY HISTORY: Positive for diabetes. Negative for MI. Negative for cancer.   PAST SURGICAL HISTORY: None.   PHYSICAL EXAMINATION:  VITAL SIGNS: Blood pressure 145/85, pulse 99, respirations 20, temperature 98.  GENERAL: The patient is alert and oriented x3, in no acute distress at this moment. No respiratory distress. Hemodynamically stable.  HEENT: Pupils are equal, round and reactive. Extraocular movements intact. Mucosa is moist. Anicteric sclerae. Pink conjunctivae. No oral lesions. No oropharyngeal exudates.  NECK: Supple. No JVD. No thyromegaly. No adenopathy. No carotid bruits.  CARDIOVASCULAR: Regular rate and rhythm. Heart tones are hyperdynamic with  good apical impulse, hyperdynamic, tachycardic. No murmurs, rubs or gallops. Negative S3, S4.  Mild tenderness to palpation of anterior chest wall.  ABDOMEN: Soft, nontender, nondistended. No hepatosplenomegaly. No masses. Bowel sounds are positive.  GENITAL:  Deferred.  EXTREMITIES: No edema, cyanosis or clubbing. Pulses +2. Capillary refill less than 3.  SKIN: No rashes or petechiae.  MUSCULOSKELETAL: No joint effusions or joint swelling.  LYMPHATIC: Negative for lymphadenopathy in the neck or supraclavicular areas.  NEUROLOGIC: Cranial nerves II through XII intact. Strength is 5 out of 5 all 4 extremities.  PSYCHIATRIC: No agitation or depression.   EKG: Normal sinus rhythm. No significant ST depression or elevation. Hyperacute T waves that, compared with previous EKG, are known to be present before.  LABORATORY DATA: Blood sugars of 600s, BUN 22, creatinine 1.20, sodium 127 due to increased blood sugars. Lipase is slightly elevated at 486. LFTs slightly elevated, but they have been chronically elevated in the past due to alcohol abuse. AST 77, ALT 110, bilirubin is 0.08, albumin 2.9. White count 8.7, hemoglobin 12, platelet count 158. White count on urine is 12, red blood cells in urine are 40, leukocyte esterase +2.   ASSESSMENT AND PLAN: A 64 year old gentleman with chest pain.   1. Chest pain. The patient has mostly atypical characteristics. At this moment, we are going to give him a GI cocktail and see if this improves his symptoms. We are going to go to the route of acute coronary syndrome and give him aspirin, morphine, nitroglycerin, give him Lovenox not at therapeutic dose, just a prophylactic dose. The patient has history of cocaine abuse, for which we are going to avoid beta blockers for now until we get a urine drug screen. The patient is very tachycardic, for which we are going to do a calcium channel blocker for now. If chest pain persists, consider the use of benzodiazepines in case there is any vasospasm. There are some changes on EKG that are chronic, including T wave elevation. The patient has also history of diabetes, and his blood sugars are really high, and this could be playing a role in his chest pain.  2. Severe hyperglycemia.  Blood sugars are coming down slowly, in the 400s. The patient is going to get Lantus tonight and insulin sliding scale. Continue IV fluids with NS to improve his hyperglycemia. At this moment, the patient is going to be n.p.o. due to a stress test in the morning. If the cocaine is positive or the troponin is positive, we are going to hold off on the stress test and consult cardiology.  3. Chronic obstructive pulmonary disease. At this moment, no signs of exacerbation. We are going to avoid any steroids at this moment and just give him nebulizers. I am going to use Xopenex as the patient is tachycardic with potential acute coronary syndrome.  4. Hypertension. Continue lisinopril. Add Cardizem.  5. Smoking cessation. Counseling given to the patient for over 4 minutes. Nicotine patches prescribed to the patient. The patient accepts trying to quit smoking.  6. Other medical problems are stable.  7. Gastrointestinal prophylaxis with Protonix.  8. Deep vein thrombosis prophylaxis with Lovenox.   TIME SPENT: I spent about 45 minutes with this patient.    ____________________________ Gilboa Sink, MD rsg:lb D: 09/08/2013 05:27:43 ET T: 09/08/2013 06:06:21 ET JOB#: [id number]  cc: [place] Sink, MD, <Dictator> Nykiah Ma America Brown MD ELECTRONICALLY SIGNED 09/12/2013 20:12

## 2014-07-31 NOTE — Discharge Summary (Signed)
Dates of Admission and Diagnosis:  Date of Admission 08-Sep-2013   Date of Discharge 09-Sep-2013   Admitting Diagnosis chest pain   Final Diagnosis chest pain- atypical- Stress test negative. htn DM hyponatremia.    Chief Complaint/History of Present Illness A 64 year old gentleman with polysubstance abuse history, tobacco abuse, cocaine, COPD, diabetes, hypertension, who presented to the Emergency Department complaining of chest pain that has been going on for 1 week. The patient states that the chest pain happens usually after he eats. It is epigastric and in the middle of the chest. The patient states that tonight it was worse, going to intensity of 9 out of 10. Right now, the patient states that the intensity is 6 out of 10, but it goes continuously. The patient does not have any radiation of the pain. No alleviating factors. No worsening factors other than food. Exercise does not alter the pain, but he is not very active anyway. He associates some mild shortness of breath, and the pain is described as sharp. The patient has not been taking any blood pressure medications or diabetes medication pills that he used to take for over a month. The patient again does not have a primary care physician. His first set of troponins is negative. His EKG did not show any significant ST depression or elevation, but he has very peaked T wave on lateral leads.   Allergies:  No Known Allergies:   Hepatic:  02-Jun-15 01:58   Bilirubin, Total 0.8  Alkaline Phosphatase 115 (45-117 NOTE: New Reference Range 02/27/13)  SGPT (ALT)  110  SGOT (AST)  77  Total Protein, Serum  8.5  Albumin, Serum  2.9  Routine Micro:  02-Jun-15 01:58   Micro Text Report URINE CULTURE   COMMENT                   MIXED BACTERIAL ORGANISMS   COMMENT                   RESULTS SUGGESTIVE OF CONTAMINATION   ANTIBIOTIC                       Specimen Source CLEAN CATCH  Culture Comment MIXED BACTERIAL ORGANISMS  Culture  Comment . RESULTS SUGGESTIVE OF CONTAMINATION  Result(s) reported on 09 Sep 2013 at 10:02AM.  Routine Chem:  02-Jun-15 01:58   Lipase  486 (Result(s) reported on 08 Sep 2013 at 02:42AM.)  Glucose, Serum  606  BUN  22  Creatinine (comp) 1.20  Sodium, Serum  127  Potassium, Serum 4.4  Chloride, Serum  92  CO2, Serum 26  Calcium (Total), Serum 9.0  Anion Gap 9  Osmolality (calc) 287  eGFR (African American) >60  eGFR (Non-African American) >60 (eGFR values <62m/min/1.73 m2 may be an indication of chronic kidney disease (CKD). Calculated eGFR is useful in patients with stable renal function. The eGFR calculation will not be reliable in acutely ill patients when serum creatinine is changing rapidly. It is not useful in  patients on dialysis. The eGFR calculation may not be applicable to patients at the low and high extremes of body sizes, pregnant women, and vegetarians.)  Result Comment GLUCOSE - RESULTS VERIFIED BY REPEAT TESTING.  - NOTIFIED OF CRITICAL VALUE  - AMANDA NORTHRUP _0  09-08-13 BY [facility]  - READ-BACK PROCESS PERFORMED.  Result(s) reported on 08 Sep 2013 at 02:59AM.  03-Jun-15 04:51   Lipase 203 (Result(s) reported on 09 Sep 2013 at 05:34AM.)  Cholesterol, Serum  88  Triglycerides, Serum 168  HDL (INHOUSE)  17  VLDL Cholesterol Calculated 34  LDL Cholesterol Calculated 37 (Result(s) reported on 09 Sep 2013 at 05:29AM.)  Glucose, Serum  222  BUN  37  Creatinine (comp) 1.12  Sodium, Serum  132  Potassium, Serum 3.5  Chloride, Serum 101  CO2, Serum 26  Calcium (Total), Serum 8.6  Anion Gap  5  Osmolality (calc) 280  eGFR (African American) >60  eGFR (Non-African American) >60 (eGFR values <60mL/min/1.73 m2 may be an indication of chronic kidney disease (CKD). Calculated eGFR is useful in patients with stable renal function. The eGFR calculation will not be reliable in acutely ill patients when serum creatinine is changing rapidly. It is not useful in  patients  on dialysis. The eGFR calculation may not be applicable to patients at the low and high extremes of body sizes, pregnant women, and vegetarians.)  Hemoglobin A1c ([facility])  13.2 (The American Diabetes Association recommends that a primary goal of therapy should be <7% and that physicians should reevaluate the treatment regimen in patients with HbA1c values consistently >8%.)  Urine Drugs:  42-LTR-32 02:33   Tricyclic Antidepressant, Ur Qual (comp) NEGATIVE (Result(s) reported on 08 Sep 2013 at 06:47AM.)  Amphetamines, Urine Qual. NEGATIVE  MDMA, Urine Qual. NEGATIVE  Cocaine Metabolite, Urine Qual. NEGATIVE  Opiate, Urine qual NEGATIVE  Phencyclidine, Urine Qual. NEGATIVE  Cannabinoid, Urine Qual. NEGATIVE  Barbiturates, Urine Qual. NEGATIVE  Benzodiazepine, Urine Qual. NEGATIVE (----------------- The URINE DRUG SCREEN provides only a preliminary, unconfirmed analytical test result and should not be used for non-medical  purposes.  Clinical consideration and professional judgment should be  applied to any positive drug screen result due to possible interfering substances.  A more specific alternate chemical method must be used in order to obtain a confirmed analytical result.  Gas chromatography/mass spectrometry (GC/MS) is the preferred confirmatory method.)  Methadone, Urine Qual. NEGATIVE  Cardiac:  02-Jun-15 01:58   CK, Total 135 (39-308 NOTE: NEW REFERENCE RANGE  05/11/2013)  CPK-MB, Serum  3.9 (Result(s) reported on 08 Sep 2013 at 02:54AM.)  Troponin I < 0.02 (0.00-0.05 0.05 ng/mL or less: NEGATIVE  Repeat testing in 3-6 hrs  if clinically indicated. >0.05 ng/mL: POTENTIAL  MYOCARDIAL INJURY. Repeat  testing in 3-6 hrs if  clinically indicated. NOTE: An increase or decrease  of 30% or more on serial  testing suggests a  clinically important change)  Routine UA:  02-Jun-15 01:58   Color (UA) Straw  Clarity (UA) Clear  Glucose (UA) >=500  Bilirubin (UA) Negative   Ketones (UA) Negative  Specific Gravity (UA) 1.027  Blood (UA) Negative  pH (UA) 5.0  Protein (UA) Negative  Nitrite (UA) Negative  Leukocyte Esterase (UA) 2+ (Result(s) reported on 08 Sep 2013 at 03:23AM.)  RBC (UA) 40 /HPF  WBC (UA) 12 /HPF  Bacteria (UA) TRACE  Epithelial Cells (UA) NONE SEEN  Budding Yeast (UA) PRESENT (Result(s) reported on 08 Sep 2013 at 03:23AM.)  Routine Coag:  02-Jun-15 01:58   Prothrombin 14.2  INR 1.1 (INR reference interval applies to patients on anticoagulant therapy. A single INR therapeutic range for coumarins is not optimal for all indications; however, the suggested range for most indications is 2.0 - 3.0. Exceptions to the INR Reference Range may include: Prosthetic heart valves, acute myocardial infarction, prevention of myocardial infarction, and combinations of aspirin and anticoagulant. The need for a higher or lower target INR must be assessed individually. Reference: The Pharmacology and Management of the  Vitamin K  antagonists: the seventh ACCP Conference on Antithrombotic and Thrombolytic Therapy. LEXNT.7001 Sept:126 (3suppl): [id number]. A HCT value >55% may artifactually increase the PT.  In one study,  the increase was an average of 25%. Reference:  "Effect on Routine and Special Coagulation Testing Values of Citrate Anticoagulant Adjustment in Patients with High HCT Values." American Journal of Clinical Pathology 2006;126:400-405.)  Activated PTT (APTT) 30.5 (A HCT value >55% may artifactually increase the APTT. In one study, the increase was an average of 19%. Reference: "Effect on Routine and Special Coagulation Testing Values of Citrate Anticoagulant Adjustment in Patients with High HCT Values." American Journal of Clinical Pathology 2006;126:400-405.)  Routine Hem:  02-Jun-15 01:58   WBC (CBC) 8.4  RBC (CBC)  3.91  Hemoglobin (CBC)  12.4  Hematocrit (CBC)  37.0  Platelet Count (CBC) 158 (Result(s) reported on 08 Sep 2013 at  02:26AM.)  MCV 95  MCH 31.8  MCHC 33.6  RDW 12.4  03-Jun-15 04:51   WBC (CBC) 8.8  RBC (CBC)  3.04  Hemoglobin (CBC)  9.7  Hematocrit (CBC)  28.4  Platelet Count (CBC)  136  MCV 94  MCH 31.8  MCHC 34.0  RDW 12.6  Neutrophil % 51.9  Lymphocyte % 32.7  Monocyte % 13.0  Eosinophil % 1.8  Basophil % 0.6  Neutrophil # 4.6  Lymphocyte # 2.9  Monocyte #  1.2  Eosinophil # 0.2  Basophil # 0.1 (Result(s) reported on 09 Sep 2013 at 05:21AM.)   PERTINENT RADIOLOGY STUDIES: LabUnknown:    02-Jun-15 14:02, NM MYOCARDIAL SCAN  PACS Image    Pertinent Past History:  Pertinent Past History 1. Polysubstance abuse, alcohol abuse, cocaine abuse, tobacco abuse.  2. COPD.  3. Type 2 diabetes.  4. Noncompliance.  5. Hypertension.  6. History of GI bleeding.  7. History of MRSA infection.   Hospital Course:  Hospital Course * Chest pain- likley atypical.    Crdio consult appreciated- stress test- negative. Monitor on tele, Troponin negative.   stop  ASA, Statin.  * Hyperglycemia- DM- non compliant   Started on metformin, GLipizide    Insulin sliding scale coverage.    Long active insulin.    Councelled for medication compliance, and spoke to CM to help.  * hyponatremia- Due to Hyperglycemia. resolving now.  * Htn- Lisinopril, Cardizem.    * Tachycardia- started on Cardizem.  * Smoking- councelling done for 4 min.   Condition on Discharge Stable   Code Status:  Code Status Full Code   DISCHARGE INSTRUCTIONS HOME MEDS:  Medication Reconciliation: Patient's Home Medications at Discharge:     Medication Instructions  lisinopril 20 mg oral tablet  1 tab(s) orally once a day   glipizide 10 mg oral tablet  1 tab(s) orally 2 times a day (before meals)   metformin 500 mg oral tablet  1 tab(s) orally 2 times a day (with meals)   ranitidine 150 mg oral capsule  1 cap(s) orally 2 times a day     Physician's Instructions:  Diet Low Sodium  Carbohydrate Controlled (ADA)  Diet   Activity Limitations As tolerated   Return to Work Not Applicable   Time frame for Follow Up Appointment 2-4 weeks  [facility]   Electronic Signatures: Vaughan Basta (MD)  (Signed 04-Jun-15 18:42)  Authored: ADMISSION DATE AND DIAGNOSIS, CHIEF COMPLAINT/HPI, Allergies, PERTINENT LABS, PERTINENT RADIOLOGY STUDIES, PERTINENT PAST HISTORY, HOSPITAL COURSE, DISCHARGE INSTRUCTIONS HOME MEDS, PATIENT INSTRUCTIONS   Last Updated: 04-Jun-15 18:42 by Anselm Jungling,  Rosalio Macadamia (MD)

## 2014-08-07 ENCOUNTER — Emergency Department
Admit: 2014-08-07 | Discharge: 2014-08-08 | Disposition: A | Discharge status: Home / Self Care | Payer: Medicaid Other | Source: Ambulatory Visit | Attending: Emergency Medicine | Admitting: Emergency Medicine

## 2014-08-07 DIAGNOSIS — Z720 Tobacco use: Secondary | ICD-10-CM | POA: Insufficient documentation

## 2014-08-07 DIAGNOSIS — L989 Disorder of the skin and subcutaneous tissue, unspecified: Secondary | ICD-10-CM | POA: Diagnosis present

## 2014-08-07 DIAGNOSIS — E1165 Type 2 diabetes mellitus with hyperglycemia: Secondary | ICD-10-CM | POA: Insufficient documentation

## 2014-08-07 DIAGNOSIS — I10 Essential (primary) hypertension: Secondary | ICD-10-CM | POA: Diagnosis not present

## 2014-08-07 LAB — CBC
HCT: 39.2 % — AB (ref 40.0–52.0)
HGB: 13.1 g/dL (ref 13.0–18.0)
MCH: 30.7 pg (ref 26.0–34.0)
MCHC: 33.6 g/dL (ref 32.0–36.0)
MCV: 92 fL (ref 80–100)
Platelet: 181 10*3/uL (ref 150–440)
RBC: 4.28 10*6/uL — ABNORMAL LOW (ref 4.40–5.90)
RDW: 13.2 % (ref 11.5–14.5)
WBC: 8.1 10*3/uL (ref 3.8–10.6)

## 2014-08-07 LAB — COMPREHENSIVE METABOLIC PANEL
Albumin: 3.5 g/dL
Alkaline Phosphatase: 104 U/L
Anion Gap: 7 (ref 7–16)
BUN: 19 mg/dL
Bilirubin,Total: 0.8 mg/dL
CO2: 27 mmol/L
CREATININE: 1.31 mg/dL — AB
Calcium, Total: 9 mg/dL
Chloride: 88 mmol/L — ABNORMAL LOW
GFR CALC NON AF AMER: 58 — AB
Glucose: 765 mg/dL
Potassium: 4.9 mmol/L
SGOT(AST): 120 U/L — ABNORMAL HIGH
SGPT (ALT): 103 U/L — ABNORMAL HIGH
Sodium: 122 mmol/L — ABNORMAL LOW
Total Protein: 8.9 g/dL — ABNORMAL HIGH

## 2014-08-08 LAB — GLUCOSE, CAPILLARY
Glucose-Capillary: 407 mg/dL — ABNORMAL HIGH (ref 70–99)
Glucose-Capillary: 318 mg/dL — ABNORMAL HIGH (ref 70–99)

## 2014-08-08 NOTE — ED Notes (Signed)
CBG value of 318 taken at this time.

## 2014-08-08 NOTE — ED Notes (Signed)
Pt left ER ambulatory and NAD noted. Pt given discharge instructions given. Pt understood and had no further questions.

## 2014-08-20 ENCOUNTER — Emergency Department
Admission: EM | Admit: 2014-08-20 | Discharge: 2014-08-20 | Disposition: A | Discharge status: Home / Self Care | Payer: Medicaid Other | Attending: Emergency Medicine | Admitting: Emergency Medicine

## 2014-08-20 DIAGNOSIS — Z720 Tobacco use: Secondary | ICD-10-CM | POA: Insufficient documentation

## 2014-08-20 DIAGNOSIS — L0232 Furuncle of buttock: Secondary | ICD-10-CM | POA: Insufficient documentation

## 2014-08-20 DIAGNOSIS — Z7982 Long term (current) use of aspirin: Secondary | ICD-10-CM | POA: Insufficient documentation

## 2014-08-20 DIAGNOSIS — L089 Local infection of the skin and subcutaneous tissue, unspecified: Secondary | ICD-10-CM | POA: Diagnosis present

## 2014-08-20 DIAGNOSIS — I10 Essential (primary) hypertension: Secondary | ICD-10-CM | POA: Diagnosis not present

## 2014-08-20 DIAGNOSIS — E119 Type 2 diabetes mellitus without complications: Secondary | ICD-10-CM | POA: Diagnosis not present

## 2014-08-20 DIAGNOSIS — S31819D Unspecified open wound of right buttock, subsequent encounter: Secondary | ICD-10-CM

## 2014-08-20 NOTE — ED Notes (Signed)
Pt states he as had abscess to rectum for about 2 weeks, seen in ED and stated his BS was elevated when he was here, has an appointment on 5/18 with Dr. Felton Clinton but states he can not wait until then

## 2014-08-20 NOTE — Discharge Instructions (Signed)
Follow-up with Dr. Felton Clinton at [facility] surgical as planned on the 18th of this month  Return to the ER right away she develops severe pain, fever, redness, severe bleeding, or other new concerns arise.  As I have previously discussed with the it is imperative that she get follow-up on this to make sure it is not cancerous. In addition you should set up a primary care physician albumin at her blood sugars and other chronic medical conditions.

## 2014-08-20 NOTE — ED Provider Notes (Signed)
[facility] Emergency Department Provider Note  ____________________________________________  Time seen: Approximately 8:59 AM  I have reviewed the triage vital signs and the nursing notes.   HISTORY  Chief Complaint Recurrent Skin Infections    HPI [name] is a 64 y.o. male who states he has a boil on his butt. He states he tried to get an appointment, but couldn't make it to see Dr. Felton Clinton. However he now has an appointment with surgery on May 18. He states he is here because it is ongoing and he missed his doctor's appointment.  He states that he occasionally has some bleeding, it causes pain and irritation, he denies any fevers chills, abdominal pain, rectal bleeding or other concerns.  The patient is over the right buttock. Pain is rated as mild to moderate worse with sitting upon. Denies fevers. Has been seen previously for same.  Past Medical History  Diagnosis Date  . Hypertension   . Diabetes mellitus     There are no active problems to display for this patient.   No past surgical history on file.  Current Outpatient Rx  Name  Route  Sig  Dispense  Refill  . aspirin EC 81 MG tablet   Oral   Take 81 mg by mouth every other day.           Allergies Review of patient's allergies indicates no known allergies.  No family history on file.  Social History History  Substance Use Topics  . Smoking status: Current Every Day Smoker  . Smokeless tobacco: Not on file  . Alcohol Use: Yes    Review of Systems Constitutional: No fever/chills Eyes: No visual changes. ENT: No sore throat. Cardiovascular: Denies chest pain. Respiratory: Denies shortness of breath. Gastrointestinal: No abdominal pain.  No nausea, no vomiting.  No diarrhea.  No constipation. Genitourinary: Negative for dysuria. Musculoskeletal: Negative for back pain. Skin: Negative for rash. States there is a boil over his right buttock, it continues to occasionally  bleed slightly. It is irritated with sitting on. An causes pain mostly with movement and walking. Neurological: Negative for headaches, focal weakness or numbness.  10-point ROS otherwise negative.  ____________________________________________   PHYSICAL EXAM:  VITAL SIGNS: ED Triage Vitals  Enc Vitals Group     BP 08/20/14 0813 97/58 mmHg     Pulse Rate 08/20/14 0813 102     Resp 08/20/14 0813 18     Temp 08/20/14 0813 97.9 F (36.6 C)     Temp Source 08/20/14 0813 Oral     SpO2 08/20/14 0813 100 %     Weight 08/20/14 0813 150 lb (68.04 kg)     Height 08/20/14 0813 5\' 9"  (1.753 m)     Head Cir --      Peak Flow --      Pain Score 08/20/14 0814 0     Pain Loc --      Pain Edu? --      Excl. in [place]? --     Constitutional: Alert and oriented. Well appearing and in no acute distress. Eyes: Conjunctivae are normal. PERRL. EOMI. Head: Atraumatic. Nose: No congestion/rhinnorhea. Mouth/Throat: Mucous membranes are moist.  Oropharynx non-erythematous. Neck: No stridor.   Cardiovascular: Normal rate, regular rhythm. Grossly normal heart sounds.  Good peripheral circulation. Respiratory: Normal respiratory effort.  No retractions. Lungs CTAB. Gastrointestinal: Soft and nontender. No distention. No abdominal bruits. No CVA tenderness. Rectal: The patient has a proximally 2 cm erythematous, somewhat fungating  appearing mass over the right gluteal cleft. It is currently not bleeding, but there is evidence of scabbing. There is also a small external hemorrhoid which is nontender. There is no evidence of rectal bleeding. Musculoskeletal: No lower extremity tenderness nor edema.  No joint effusions. Neurologic:  Normal speech and language. No gross focal neurologic deficits are appreciated. Speech is normal. No gait instability. Skin:  Skin is warm, dry and intact. No rash noted. Psychiatric: Mood and affect are normal. Speech and behavior are  normal.  ____________________________________________   LABS (all labs ordered are listed, but only abnormal results are displayed)  Labs Reviewed - No data to display ____________________________________________  EKG   ____________________________________________  RADIOLOGY   ____________________________________________   PROCEDURES  Procedure(s) performed: None  Critical Care performed: No  ____________________________________________   INITIAL IMPRESSION / ASSESSMENT AND PLAN / ED COURSE  Pertinent labs & imaging results that were available during my care of the patient were reviewed by me and considered in my medical decision making (see chart for details).  Patient repair presents for evaluation of abscess over the left leg. I've previously seen this patient for same, he's been instructed previously that he needs to follow-up with the surgical clinic and that my concern is this could represent a possible sign of cancer. The patient has now made an appointment on May 18 with really surgical.  At this point, I see no evidence of cellulitis, no evidence of abscess, but suspect this is some type of a skin lesion or mass which is going to require surgical evaluation to rule out malignancy.  I discussed with the patient that he is to follow up with surgery as he is very scheduled. He should return to the ER right away if he develops fevers, redness, severe pain, bleeding from her rectum, or other new concerns arise.   ____________________________________________   FINAL CLINICAL IMPRESSION(S) / ED DIAGNOSES  Final diagnoses:  Buttock wound, right, subsequent encounter      Delman Kitten, MD 08/20/14 [phone]

## 2014-08-20 NOTE — ED Notes (Signed)
Has had abcess/boli on buttocks for several months, past 2 weks has been draining, seen here recently was referred, , has appointment next week, more bleeding, noted sores around rectum, some blood on skin

## 2014-08-25 ENCOUNTER — Other Ambulatory Visit: Payer: Self-pay | Admitting: Surgery

## 2014-08-25 DIAGNOSIS — K629 Disease of anus and rectum, unspecified: Secondary | ICD-10-CM

## 2014-08-25 DIAGNOSIS — J449 Chronic obstructive pulmonary disease, unspecified: Secondary | ICD-10-CM

## 2014-08-30 ENCOUNTER — Encounter
Admission: RE | Admit: 2014-08-30 | Discharge: 2014-08-30 | Disposition: A | Discharge status: Home / Self Care | Payer: Medicaid Other | Source: Ambulatory Visit | Attending: Surgery | Admitting: Surgery

## 2014-08-30 DIAGNOSIS — Z01812 Encounter for preprocedural laboratory examination: Secondary | ICD-10-CM | POA: Diagnosis present

## 2014-08-30 DIAGNOSIS — K629 Disease of anus and rectum, unspecified: Secondary | ICD-10-CM | POA: Diagnosis not present

## 2014-08-30 DIAGNOSIS — Z01810 Encounter for preprocedural cardiovascular examination: Secondary | ICD-10-CM | POA: Insufficient documentation

## 2014-08-30 DIAGNOSIS — I10 Essential (primary) hypertension: Secondary | ICD-10-CM

## 2014-08-30 LAB — BASIC METABOLIC PANEL
ANION GAP: 8 (ref 5–15)
BUN: 12 mg/dL (ref 6–20)
CALCIUM: 8.9 mg/dL (ref 8.9–10.3)
CO2: 26 mmol/L (ref 22–32)
CREATININE: 0.83 mg/dL (ref 0.61–1.24)
Chloride: 89 mmol/L — ABNORMAL LOW (ref 101–111)
GFR calc Af Amer: >60 mL/min (ref >60)
GFR calc non Af Amer: >60 mL/min (ref >60)
GLUCOSE: 335 mg/dL — AB (ref 65–99)
Potassium: 4.1 mmol/L (ref 3.5–5.1)
Sodium: 123 mmol/L — ABNORMAL LOW (ref 135–145)

## 2014-08-30 LAB — DIFFERENTIAL
BASOS ABS: 0 10*3/uL (ref 0–0.1)
Basophils Relative: 1 %
EOS ABS: 0.5 10*3/uL (ref 0–0.7)
Eosinophils Relative: 7 %
LYMPHS ABS: 1.3 10*3/uL (ref 1.0–3.6)
Lymphocytes Relative: 18 %
MONOS PCT: 17 %
Monocytes Absolute: 1.2 10*3/uL — ABNORMAL HIGH (ref 0.2–1.0)
NEUTROS ABS: 4.3 10*3/uL (ref 1.4–6.5)
Neutrophils Relative %: 57 %

## 2014-08-30 LAB — CBC
HCT: 27.9 % — ABNORMAL LOW (ref 40.0–52.0)
HEMOGLOBIN: 9 g/dL — AB (ref 13.0–18.0)
MCH: 29 pg (ref 26.0–34.0)
MCHC: 32.3 g/dL (ref 32.0–36.0)
MCV: 89.8 fL (ref 80.0–100.0)
Platelets: 187 10*3/uL (ref 150–440)
RBC: 3.1 MIL/uL — ABNORMAL LOW (ref 4.40–5.90)
RDW: 13.7 % (ref 11.5–14.5)
WBC: 7.3 10*3/uL (ref 3.8–10.6)

## 2014-08-30 LAB — HEPATIC FUNCTION PANEL
ALBUMIN: 3.3 g/dL — AB (ref 3.5–5.0)
ALT: 50 U/L (ref 17–63)
AST: 74 U/L — ABNORMAL HIGH (ref 15–41)
Alkaline Phosphatase: 86 U/L (ref 38–126)
Bilirubin, Direct: 0.4 mg/dL (ref 0.1–0.5)
Indirect Bilirubin: 0.6 mg/dL (ref 0.3–0.9)
Total Bilirubin: 1 mg/dL (ref 0.3–1.2)
Total Protein: 8 g/dL (ref 6.5–8.1)

## 2014-08-30 MED ORDER — ACETAMINOPHEN 10 MG/ML IV SOLN
INTRAVENOUS | Status: AC
  Filled 2014-08-30: qty 100

## 2014-08-30 NOTE — Patient Instructions (Signed)
  Your procedure is scheduled on:Sep 02, 2014 Report to Same  Day Surgery. To find out your arrival time please call [phone] between 1PM - 3PM on Sep 01, 2014.  Remember: Instructions that are not followed completely may result in serious medical risk, up to and including death, or upon the discretion of your surgeon and anesthesiologist your surgery may need to be rescheduled.    __x_ 1. Do not eat food or drink liquids after midnight. No gum chewing or hard candies.     __x__ 2. No Alcohol for 24 hours before or after surgery.   ____ 3. Bring all medications with you on the day of surgery if instructed.    __x__ 4. Notify your doctor if there is any change in your medical condition     (cold, fever, infections).     Do not wear jewelry, make-up, hairpins, clips or nail polish.  Do not wear lotions, powders, or perfumes. You may wear deodorant.  Do not shave 48 hours prior to surgery. Men may shave face and neck.  Do not bring valuables to the hospital.    [facility] is not responsible for any belongings or valuables.               Contacts, dentures or bridgework may not be worn into surgery.  Leave your suitcase in the car. After surgery it may be brought to your room.  For patients admitted to the hospital, discharge time is determined by your                treatment team.   Patients discharged the day of surgery will not be allowed to drive home.   Please read over the following fact sheets that you were given:   Surgical Site Infection Prevention     ____ Take these medicines the morning of surgery with A SIP OF WATER:    1.         ____ Fleet Enema (as directed)   ____ Use CHG Soap as directed  ____ Use inhalers on the day of surgery  ____ Stop metformin 2 days prior to surgery    ____ Take 1/2 of usual insulin dose the night before surgery and none on the morning of surgery.   ____ Stop Coumadin/Plavix/aspirin on   ____ Stop Anti-inflammatories on    ____ Stop supplements until after surgery.    ____ Bring C-Pap to the hospital.

## 2014-08-31 ENCOUNTER — Ambulatory Visit
Admission: RE | Admit: 2014-08-31 | Discharge: 2014-08-31 | Disposition: A | Discharge status: Home / Self Care | Payer: Medicaid Other | Source: Ambulatory Visit | Attending: Surgery | Admitting: Surgery

## 2014-08-31 DIAGNOSIS — R911 Solitary pulmonary nodule: Secondary | ICD-10-CM | POA: Insufficient documentation

## 2014-08-31 DIAGNOSIS — K629 Disease of anus and rectum, unspecified: Secondary | ICD-10-CM | POA: Insufficient documentation

## 2014-08-31 DIAGNOSIS — J449 Chronic obstructive pulmonary disease, unspecified: Secondary | ICD-10-CM | POA: Diagnosis present

## 2014-08-31 LAB — MISC LABCORP TEST (SEND OUT)

## 2014-08-31 MED ORDER — IOHEXOL 300 MG/ML  SOLN
100.0000 mL | Freq: Once | INTRAMUSCULAR | Status: AC | PRN
  Administered 2014-08-31: 100 mL via INTRAVENOUS

## 2014-09-02 ENCOUNTER — Encounter: Admission: RE | Payer: Self-pay | Source: Ambulatory Visit

## 2014-09-02 ENCOUNTER — Ambulatory Visit: Admission: RE | Admit: 2014-09-02 | Payer: Medicaid Other | Source: Ambulatory Visit | Admitting: Surgery

## 2014-09-02 SURGERY — SIGMOIDOSCOPY, FLEXIBLE
Anesthesia: General

## 2014-11-01 ENCOUNTER — Emergency Department: Payer: Medicaid Other

## 2014-11-01 ENCOUNTER — Inpatient Hospital Stay
Admission: EM | Admit: 2014-11-01 | Discharge: 2014-11-04 | DRG: 394 | Disposition: A | Discharge status: Home / Self Care | Payer: Medicaid Other | Attending: Surgery | Admitting: Surgery

## 2014-11-01 ENCOUNTER — Encounter: Payer: Self-pay | Admitting: Emergency Medicine

## 2014-11-01 DIAGNOSIS — Z716 Tobacco abuse counseling: Secondary | ICD-10-CM | POA: Diagnosis present

## 2014-11-01 DIAGNOSIS — K6289 Other specified diseases of anus and rectum: Secondary | ICD-10-CM | POA: Diagnosis present

## 2014-11-01 DIAGNOSIS — D649 Anemia, unspecified: Secondary | ICD-10-CM | POA: Diagnosis present

## 2014-11-01 DIAGNOSIS — R739 Hyperglycemia, unspecified: Secondary | ICD-10-CM

## 2014-11-01 DIAGNOSIS — T148XXA Other injury of unspecified body region, initial encounter: Secondary | ICD-10-CM

## 2014-11-01 DIAGNOSIS — F17210 Nicotine dependence, cigarettes, uncomplicated: Secondary | ICD-10-CM | POA: Diagnosis present

## 2014-11-01 DIAGNOSIS — L0100 Impetigo, unspecified: Secondary | ICD-10-CM | POA: Diagnosis present

## 2014-11-01 DIAGNOSIS — K625 Hemorrhage of anus and rectum: Secondary | ICD-10-CM | POA: Diagnosis present

## 2014-11-01 DIAGNOSIS — E1165 Type 2 diabetes mellitus with hyperglycemia: Secondary | ICD-10-CM | POA: Diagnosis present

## 2014-11-01 DIAGNOSIS — L97511 Non-pressure chronic ulcer of other part of right foot limited to breakdown of skin: Secondary | ICD-10-CM | POA: Diagnosis present

## 2014-11-01 DIAGNOSIS — I959 Hypotension, unspecified: Secondary | ICD-10-CM | POA: Diagnosis present

## 2014-11-01 DIAGNOSIS — Z9114 Patient's other noncompliance with medication regimen: Secondary | ICD-10-CM | POA: Diagnosis present

## 2014-11-01 DIAGNOSIS — D129 Benign neoplasm of anus and anal canal: Principal | ICD-10-CM | POA: Diagnosis present

## 2014-11-01 DIAGNOSIS — I10 Essential (primary) hypertension: Secondary | ICD-10-CM | POA: Diagnosis present

## 2014-11-01 DIAGNOSIS — M7989 Other specified soft tissue disorders: Secondary | ICD-10-CM

## 2014-11-01 DIAGNOSIS — C44590 Other specified malignant neoplasm of anal skin: Secondary | ICD-10-CM | POA: Diagnosis present

## 2014-11-01 DIAGNOSIS — E11621 Type 2 diabetes mellitus with foot ulcer: Secondary | ICD-10-CM | POA: Diagnosis present

## 2014-11-01 DIAGNOSIS — I9589 Other hypotension: Secondary | ICD-10-CM | POA: Diagnosis present

## 2014-11-01 LAB — COMPREHENSIVE METABOLIC PANEL
ALBUMIN: 2.5 g/dL — AB (ref 3.5–5.0)
ALT: 29 U/L (ref 17–63)
AST: 43 U/L — ABNORMAL HIGH (ref 15–41)
Alkaline Phosphatase: 77 U/L (ref 38–126)
Anion gap: 7 (ref 5–15)
BUN: 13 mg/dL (ref 6–20)
CALCIUM: 8.1 mg/dL — AB (ref 8.9–10.3)
CO2: 24 mmol/L (ref 22–32)
Chloride: 93 mmol/L — ABNORMAL LOW (ref 101–111)
Creatinine, Ser: 1.25 mg/dL — ABNORMAL HIGH (ref 0.61–1.24)
GLUCOSE: 459 mg/dL — AB (ref 65–99)
Potassium: 4 mmol/L (ref 3.5–5.1)
Sodium: 124 mmol/L — ABNORMAL LOW (ref 135–145)
TOTAL PROTEIN: 7.3 g/dL (ref 6.5–8.1)
Total Bilirubin: 0.8 mg/dL (ref 0.3–1.2)

## 2014-11-01 LAB — GLUCOSE, CAPILLARY
GLUCOSE-CAPILLARY: 321 mg/dL — AB (ref 65–99)
GLUCOSE-CAPILLARY: 307 mg/dL — AB (ref 65–99)
Glucose-Capillary: 261 mg/dL — ABNORMAL HIGH (ref 65–99)

## 2014-11-01 LAB — PROTIME-INR
INR: 1.15
PROTHROMBIN TIME: 14.9 s (ref 11.4–15.0)

## 2014-11-01 LAB — CBC WITH DIFFERENTIAL/PLATELET
BASOS PCT: 1 %
Basophils Absolute: 0.1 10*3/uL (ref 0–0.1)
EOS PCT: 6 %
Eosinophils Absolute: 0.7 10*3/uL (ref 0–0.7)
HEMATOCRIT: 25.5 % — AB (ref 40.0–52.0)
Hemoglobin: 7.9 g/dL — ABNORMAL LOW (ref 13.0–18.0)
Lymphocytes Relative: 16 %
Lymphs Abs: 1.8 10*3/uL (ref 1.0–3.6)
MCH: 24.3 pg — ABNORMAL LOW (ref 26.0–34.0)
MCHC: 31 g/dL — ABNORMAL LOW (ref 32.0–36.0)
MCV: 78.5 fL — ABNORMAL LOW (ref 80.0–100.0)
MONO ABS: 1.7 10*3/uL — AB (ref 0.2–1.0)
Monocytes Relative: 16 %
NEUTROS ABS: 7 10*3/uL — AB (ref 1.4–6.5)
NEUTROS PCT: 61 %
Platelets: 302 10*3/uL (ref 150–440)
RBC: 3.24 MIL/uL — ABNORMAL LOW (ref 4.40–5.90)
RDW: 16.1 % — ABNORMAL HIGH (ref 11.5–14.5)
WBC: 11.3 10*3/uL — ABNORMAL HIGH (ref 3.8–10.6)

## 2014-11-01 LAB — HEMOGLOBIN AND HEMATOCRIT, BLOOD
HEMATOCRIT: 17.8 % — AB (ref 40.0–52.0)
Hemoglobin: 5.6 g/dL — ABNORMAL LOW (ref 13.0–18.0)

## 2014-11-01 LAB — LIPASE, BLOOD: Lipase: 26 U/L (ref 22–51)

## 2014-11-01 LAB — APTT: aPTT: 30 seconds (ref 24–36)

## 2014-11-01 LAB — PREPARE RBC (CROSSMATCH)

## 2014-11-01 LAB — ABO/RH: ABO/RH(D): A POS

## 2014-11-01 LAB — HEMOGLOBIN: HEMOGLOBIN: 5.6 g/dL — AB (ref 13.0–18.0)

## 2014-11-01 MED ORDER — ACETAMINOPHEN 650 MG RE SUPP: 650.0000 mg | Freq: Four times a day (QID) | RECTAL | Status: DC | PRN

## 2014-11-01 MED ORDER — SODIUM CHLORIDE 0.9 % IV BOLUS (SEPSIS)
1000.0000 mL | Freq: Once | INTRAVENOUS | Status: AC
  Administered 2014-11-01: 1000 mL via INTRAVENOUS

## 2014-11-01 MED ORDER — SODIUM CHLORIDE 0.9 % IV SOLN
Freq: Once | INTRAVENOUS | Status: AC
  Administered 2014-11-01: 21:00:00 via INTRAVENOUS

## 2014-11-01 MED ORDER — ACETAMINOPHEN 325 MG PO TABS
650.0000 mg | ORAL_TABLET | Freq: Four times a day (QID) | ORAL | Status: DC | PRN
  Administered 2014-11-01 – 2014-11-03 (×2): 650 mg via ORAL
  Filled 2014-11-01 (×3): qty 2

## 2014-11-01 MED ORDER — IOHEXOL 300 MG/ML  SOLN
100.0000 mL | Freq: Once | INTRAMUSCULAR | Status: AC | PRN
  Administered 2014-11-01: 100 mL via INTRAVENOUS

## 2014-11-01 MED ORDER — SODIUM CHLORIDE 0.9 % IV BOLUS (SEPSIS)
2000.0000 mL | Freq: Once | INTRAVENOUS | Status: AC
  Administered 2014-11-01: 2000 mL via INTRAVENOUS

## 2014-11-01 MED ORDER — INSULIN ASPART 100 UNIT/ML ~~LOC~~ SOLN
0.0000 [IU] | SUBCUTANEOUS | Status: DC
  Administered 2014-11-01: 5 [IU] via SUBCUTANEOUS
  Administered 2014-11-01 – 2014-11-02 (×2): 7 [IU] via SUBCUTANEOUS
  Administered 2014-11-02 (×2): 3 [IU] via SUBCUTANEOUS
  Administered 2014-11-02: 5 [IU] via SUBCUTANEOUS
  Administered 2014-11-02: 7 [IU] via SUBCUTANEOUS
  Administered 2014-11-03: 9 [IU] via SUBCUTANEOUS
  Administered 2014-11-03: 5 [IU] via SUBCUTANEOUS
  Administered 2014-11-03: 2 [IU] via SUBCUTANEOUS
  Administered 2014-11-03: 5 [IU] via SUBCUTANEOUS
  Administered 2014-11-03: 7 [IU] via SUBCUTANEOUS
  Administered 2014-11-03: 2 [IU] via SUBCUTANEOUS
  Administered 2014-11-04: 5 [IU] via SUBCUTANEOUS
  Administered 2014-11-04: 3 [IU] via SUBCUTANEOUS
  Administered 2014-11-04: 1 [IU] via SUBCUTANEOUS
  Administered 2014-11-04: 2 [IU] via SUBCUTANEOUS
  Administered 2014-11-04: 3 [IU] via SUBCUTANEOUS
  Filled 2014-11-01 (×2): qty 5
  Filled 2014-11-01 (×2): qty 1
  Filled 2014-11-01: qty 5
  Filled 2014-11-01: qty 7
  Filled 2014-11-01 (×2): qty 5
  Filled 2014-11-01 (×2): qty 3
  Filled 2014-11-01: qty 9
  Filled 2014-11-01: qty 2
  Filled 2014-11-01: qty 7
  Filled 2014-11-01: qty 2
  Filled 2014-11-01: qty 7
  Filled 2014-11-01 (×2): qty 3
  Filled 2014-11-01: qty 2
  Filled 2014-11-01: qty 7

## 2014-11-01 MED ORDER — ONDANSETRON HCL 4 MG/2ML IJ SOLN: 4.0000 mg | Freq: Four times a day (QID) | INTRAMUSCULAR | Status: DC | PRN

## 2014-11-01 MED ORDER — HYDROMORPHONE HCL 1 MG/ML IJ SOLN
0.5000 mg | INTRAMUSCULAR | Status: DC | PRN
  Administered 2014-11-02: 0.5 mg via INTRAVENOUS
  Filled 2014-11-01: qty 1

## 2014-11-01 MED ORDER — HEPARIN SODIUM (PORCINE) 5000 UNIT/ML IJ SOLN
5000.0000 [IU] | Freq: Three times a day (TID) | INTRAMUSCULAR | Status: DC
  Administered 2014-11-01 – 2014-11-04 (×7): 5000 [IU] via SUBCUTANEOUS
  Filled 2014-11-01 (×7): qty 1

## 2014-11-01 MED ORDER — IOHEXOL 240 MG/ML SOLN
25.0000 mL | Freq: Once | INTRAMUSCULAR | Status: AC | PRN
  Administered 2014-11-01: 25 mL via INTRAVENOUS

## 2014-11-01 MED ORDER — SODIUM CHLORIDE 0.9 % IV SOLN
INTRAVENOUS | Status: DC
  Administered 2014-11-01 – 2014-11-04 (×4): via INTRAVENOUS

## 2014-11-01 MED ORDER — SODIUM CHLORIDE 0.9 % IV SOLN: Freq: Once | INTRAVENOUS | Status: DC

## 2014-11-01 MED ORDER — PANTOPRAZOLE SODIUM 40 MG IV SOLR
40.0000 mg | Freq: Every day | INTRAVENOUS | Status: DC
  Administered 2014-11-02 – 2014-11-03 (×3): 40 mg via INTRAVENOUS
  Filled 2014-11-01 (×3): qty 40

## 2014-11-01 NOTE — Progress Notes (Signed)
  Revisited with patient after return to critical labs Discussed with patient the risks, benefits and alternatives of blood transfusion. He voiced understanding and elected for transfusion.  Will transfuse 2 units PRBCs for symptomatic anemia Will f/u AM labs   Clayburn Pert, MD [facility]

## 2014-11-01 NOTE — H&P (Signed)
CC: Perianal bleeding  HPI: [name] is a pleasant 64 yo M with a history of ? Colon surgery who presents with 1 day of significant bleeding from his anal area.  Has been seen with Dr. Burt Knack in our office before for a sessile perianal mass which appears to come from the anus as well as a polypoid pedunculated mass.  Nothing currently is bleeding.  Has had CT scan in May which showed concerning area around anus which has appeared to have enlarged since then.  He says that he hasn't seen any physician in follow up since seeing Dr. Burt Knack. C/o perianal pruritis.  Otherwise doing well.  No fevers/chills, night sweats, shortness of breath, cough, chest pain, abdominal pain, nausea/vomiting, diarrhea/constipation, dysuria/hematuria.  Active Ambulatory Problems    Diagnosis Date Noted  . No Active Ambulatory Problems   Resolved Ambulatory Problems    Diagnosis Date Noted  . No Resolved Ambulatory Problems   Past Medical History  Diagnosis Date  . Hypertension   . Diabetes mellitus    No family history on file.   History   Social History  . Marital Status: Single    Spouse Name: N/A  . Number of Children: N/A  . Years of Education: N/A   Occupational History  . Not on file.   Social History Main Topics  . Smoking status: Current Every Day Smoker -- 1.00 packs/day for 20 years    Types: Cigarettes  . Smokeless tobacco: Never Used  . Alcohol Use: Yes  . Drug Use: No  . Sexual Activity: Not on file   Other Topics Concern  . Not on file   Social History Narrative     Medication List    ASK your doctor about these medications        lisinopril 20 MG tablet  Commonly known as:  PRINIVIL,ZESTRIL  Take 20 mg by mouth daily.     metFORMIN 500 MG tablet  Commonly known as:  GLUCOPHAGE  Take 500 mg by mouth 2 (two) times daily.        ROS: Full ROS obtained, pertinent positives and negatives as above  Blood pressure 117/82, pulse 103, temperature 98.7 F (37.1 C),  temperature source Oral, resp. rate 14, height 5\' 9"  (1.753 m), weight 70.308 kg (155 lb), SpO2 100 %. GEN: NAD/A&Ox3 FACE: no obvious facial trauma, normal external nose, normal external ears EYES: no scleral icterus, no conjunctivitis HEAD: normocephalic atraumatic CV: RRR, no MRG RESP: moving air well, lungs clear ABD: soft, nontender, nondistended ANUS: Tender to palpation left gluteal pedunculated mass, flat approx 3 x 3 cm lesion on right near gluteal cleft EXT: moving all ext well, strength 5/5 NEURO: cnII-XII grossly intact, sensation intact all 4 ext  Labs: reviewed, significant for WBC 11.3, Hgb 7.9  CT: personally reviewed Scattered intraabdominal lymphadenopathy, small RUL pulmonary nodules Increasing intergluteal cleft soft tissue mass  A/P 64 yo admit with multiple perianal lesions.  Will plan on rectal EUA with biopsy in am.  Have discussed this procedure, its risks and benefits and he would like to proceed.

## 2014-11-01 NOTE — ED Notes (Signed)
Growth recta area noted with bright red blood

## 2014-11-01 NOTE — Progress Notes (Addendum)
   11/01/14 1600  Clinical Encounter Type  Visited With Patient  Visit Type Spiritual support  Spiritual Encounters  Spiritual Needs Prayer  Stress Factors  Patient Stress Factors Health changes   Faith tradition: [employer] Status: hypertension/alert and oriented, bloody clot looking ankles, boyle on rear he says Family: none present, he says he has children but they are not supportive. However, his 1/2 sister and male cousin are supportive, he says Age/Sex: 65yrs/male Visit Assessment: The patient says he does not have much family. He says he joined a [employer] 25 yrs ago but has not been in a long time. He says he prays that is how he copes with sickness. 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   Chaplains can be reached via pager [phone] or by submitting an online request

## 2014-11-01 NOTE — ED Provider Notes (Signed)
[facility] Emergency Department Provider Note  ____________________________________________  Time seen: 12:30 PM on arrival by EMS  I have reviewed the triage vital signs and the nursing notes.   HISTORY  Chief Complaint Rectal Bleeding    HPI [name] is a 64 y.o. male with a history of a chronic soft tissue mass on the right buttock and buttock wounds. Return today for profuse bleeding from the soft tissue mass. He states is been bleeding for the past 2 days and normally bleeds off and on over the past 3-4 weeks. He previously been referred to surgery but had not yet followed up, as also intended to have a sigmoidoscopy as well as CT scan of the chest abdomen pelvis by surgery but had not yet followed up for these tests. He denies chest pain shortness of breath, but does report some dizziness on standing and walking. No syncope or falls or injuries. Denies abdominal pain vomiting or diarrhea.  According to EMS, there was a large pool of blood on the patient's bed and a tripping pattern of blood all around the patient's residence where he had been walking.  Patient reports he has not taken his diabetes medications for the past few days. No cough or anus or fever. No dysuria frequency urgency. He does note that he has a large rash on his left arm that makes the arm feel "tight" that has developed over the last 24 hours.   Past Medical History  Diagnosis Date  . Hypertension   . Diabetes mellitus     Patient take Metformin.    There are no active problems to display for this patient.   No past surgical history on file.  Current Outpatient Rx  Name  Route  Sig  Dispense  Refill  . lisinopril (PRINIVIL,ZESTRIL) 20 MG tablet   Oral   Take 20 mg by mouth daily.         . metFORMIN (GLUCOPHAGE) 500 MG tablet   Oral   Take 500 mg by mouth 2 (two) times daily.           Allergies Review of patient's allergies indicates no known  allergies.  No family history on file.  Social History History  Substance Use Topics  . Smoking status: Current Every Day Smoker -- 1.00 packs/day for 20 years    Types: Cigarettes  . Smokeless tobacco: Never Used  . Alcohol Use: Yes    Review of Systems  Constitutional: No fever or chills. No weight changes Eyes:No blurry vision or double vision.  ENT: No sore throat. Cardiovascular: No chest pain. Respiratory: No dyspnea or cough. Gastrointestinal: Negative for abdominal pain, vomiting and diarrhea.  Gluteal bleeding as above. Genitourinary: Negative for dysuria, urinary retention, bloody urine, or difficulty urinating. Musculoskeletal: Negative for back pain. No joint swelling or pain. Skin: Negative for rash. Neurological: Negative for headaches, focal weakness or numbness. Psychiatric:No anxiety or depression.   Endocrine:No hot/cold intolerance, changes in energy, or sleep difficulty.  10-point ROS otherwise negative.  ____________________________________________   PHYSICAL EXAM:  VITAL SIGNS: ED Triage Vitals  Enc Vitals Group     BP 11/01/14 1237 115/89 mmHg     Pulse Rate 11/01/14 1237 96     Resp 11/01/14 1237 20     Temp 11/01/14 1237 98.7 F (37.1 C)     Temp Source 11/01/14 1237 Oral     SpO2 11/01/14 1237 99 %     Weight 11/01/14 1237 155 lb (70.308 kg)  Height 11/01/14 1237 5\' 9"  (1.753 m)     Head Cir --      Peak Flow --      Pain Score 11/01/14 1239 0     Pain Loc --      Pain Edu? --      Excl. in [place]? --      Constitutional: Alert and oriented. Well appearing and in no distress. Eyes: No scleral icterus. Moderate conjunctival pallor. PERRL. EOMI ENT   Head: Normocephalic and atraumatic.   Nose: No congestion/rhinnorhea. No septal hematoma   Mouth/Throat: Dry mucous membranes, no pharyngeal erythema. No peritonsillar mass. No uvula shift.   Neck: No stridor. No SubQ emphysema. No  meningismus. Hematological/Lymphatic/Immunilogical: No cervical lymphadenopathy. Cardiovascular: Tachycardia heart rate 110. Normal and symmetric distal pulses are present in all extremities. No murmurs, rubs, or gallops. Respiratory: Normal respiratory effort without tachypnea nor retractions. Breath sounds are clear and equal bilaterally. No wheezes/rales/rhonchi. Gastrointestinal: Soft and nontender. No distention. There is no CVA tenderness.  No rebound, rigidity, or guarding. External anal exam reveals a large pedunculated looking soft tissue mass protruding through this skin of the left medial gluteus. This mass is briskly bleeding. There is no visible bleeding from the anus. There is also a soft tissue wound on the medial right gluteus that is hemostatic with a clean base and does not appear to be infected Genitourinary: deferred Musculoskeletal: Nontender with normal range of motion in all extremities. No joint effusions.  No lower extremity tenderness.  No edema. Neurologic:   Normal speech and language.  CN 2-10 normal. Motor grossly intact. No pronator drift.  Normal gait. No gross focal neurologic deficits are appreciated.  Skin:  Diffuse rash over the left upper extremity most pronounced on the left upper arm on the lateral edges with erythema and honey crusting. No active drainage. No crepitus or fluctuance. It is minimally tender. Psychiatric: Mood and affect are normal. Speech and behavior are normal. Patient exhibits appropriate insight and judgment.  ____________________________________________    LABS (pertinent positives/negatives) (all labs ordered are listed, but only abnormal results are displayed) Labs Reviewed  COMPREHENSIVE METABOLIC PANEL - Abnormal; Notable for the following:    Sodium 124 (*)    Chloride 93 (*)    Glucose, Bld 459 (*)    Creatinine, Ser 1.25 (*)    Calcium 8.1 (*)    Albumin 2.5 (*)    AST 43 (*)    All other components within normal limits   CBC WITH DIFFERENTIAL/PLATELET - Abnormal; Notable for the following:    WBC 11.3 (*)    RBC 3.24 (*)    Hemoglobin 7.9 (*)    HCT 25.5 (*)    MCV 78.5 (*)    MCH 24.3 (*)    MCHC 31.0 (*)    RDW 16.1 (*)    Neutro Abs 7.0 (*)    Monocytes Absolute 1.7 (*)    All other components within normal limits  LIPASE, BLOOD  PROTIME-INR  APTT  TYPE AND SCREEN   ____________________________________________   EKG    ____________________________________________    RADIOLOGY  CT chest abdomen pelvis reveals a lung nodule as well as a soft tissue mass in the intergluteal cleft which is increased from size with associated abdominal lymph nodes concerning for malignancy.  ____________________________________________   PROCEDURES  ____________________________________________   INITIAL IMPRESSION / ASSESSMENT AND PLAN / ED COURSE  Pertinent labs & imaging results that were available during my care of the patient were  reviewed by me and considered in my medical decision making (see chart for details).  Evaluation reveals a briskly bleeding soft tissue mass protruding from the gluteal area. There is evidence of significant acute anemia by symptoms and exam with tachycardia and pallor. I will discuss this with surgery for further evaluation in the ED.  He also has hyperglycemia due to noncompliance with his medications and is likely dehydrated as well. We'll give him 2 L of saline bolus.  He also appears to have impetigo of the left upper extremity which is likely due to his hyperglycemia putting him at greater risk for infection. If the patient is being discharged we'll start him on Keflex. ----------------------------------------- 4:50 PM on 11/01/2014 ----------------------------------------- Vital stable. Follow-up with surgery on-call who plans to admit the patient for further examiner anesthesia and biopsy a soft tissue mass which is highly suspicious for malignancy. Bleeding  appears to have stabilized though does at high risk for recurrent bleeding.  ____________________________________________   FINAL CLINICAL IMPRESSION(S) / ED DIAGNOSES  Final diagnoses:  Soft tissue mass  Bleeding from wound  Acute hyperglycemia   impetigo of the left upper extremity Symptomatic anemia   Carrie Mew, MD 11/01/14 [id number]

## 2014-11-01 NOTE — Progress Notes (Signed)
  Went to see patient with Dr. Rexene Edison just prior to our shift change due to calls of decreased BP and new blood loss. Patient was found to have an active area of bleeding from pedunculated mass near rectum. Bleeding was controlled with pressure and then a simple ligation with 2-O silk suture.  Gave order for saline bolus. Will check H/H and type and cross. If his BP and mentation stabilize will keep on ward, if not will transfer to ICU If H/H is significantly lower than his admission values will transfuse if he continues to be symptomatic Continue with plan for trip to OR in the AM for a rectal exam under anesthesia with biopsy and excision of areas of concern.  Will follow closely  Clayburn Pert, MD [facility]

## 2014-11-01 NOTE — Progress Notes (Signed)
Dr. Adonis Huguenin notified of critical Hgb 5.6; Barbaraann Faster, RN; [phone]; 2009

## 2014-11-01 NOTE — Progress Notes (Signed)
Notified MD regarding blood loss, BP of 56/33, and pt decreasd LOC/fatigue.

## 2014-11-01 NOTE — Progress Notes (Signed)
Pt had large amount rectal bleeding on arrival to floor. MD acknowledged. Will continue to assess. No new orders.

## 2014-11-01 NOTE — ED Notes (Signed)
Report received from Kim. G, RN. 

## 2014-11-02 ENCOUNTER — Encounter
Admission: EM | Disposition: A | Discharge status: Home / Self Care | Payer: Self-pay | Source: Home / Self Care | Attending: Surgery

## 2014-11-02 ENCOUNTER — Observation Stay: Payer: Medicaid Other | Admitting: Certified Registered Nurse Anesthetist

## 2014-11-02 ENCOUNTER — Encounter: Payer: Self-pay | Admitting: Anesthesiology

## 2014-11-02 DIAGNOSIS — L97511 Non-pressure chronic ulcer of other part of right foot limited to breakdown of skin: Secondary | ICD-10-CM | POA: Diagnosis present

## 2014-11-02 DIAGNOSIS — K625 Hemorrhage of anus and rectum: Secondary | ICD-10-CM | POA: Diagnosis present

## 2014-11-02 DIAGNOSIS — L0100 Impetigo, unspecified: Secondary | ICD-10-CM | POA: Diagnosis present

## 2014-11-02 DIAGNOSIS — C801 Malignant (primary) neoplasm, unspecified: Secondary | ICD-10-CM

## 2014-11-02 DIAGNOSIS — I9589 Other hypotension: Secondary | ICD-10-CM | POA: Diagnosis present

## 2014-11-02 DIAGNOSIS — F17210 Nicotine dependence, cigarettes, uncomplicated: Secondary | ICD-10-CM | POA: Diagnosis present

## 2014-11-02 DIAGNOSIS — D649 Anemia, unspecified: Secondary | ICD-10-CM | POA: Diagnosis present

## 2014-11-02 DIAGNOSIS — R198 Other specified symptoms and signs involving the digestive system and abdomen: Secondary | ICD-10-CM | POA: Diagnosis not present

## 2014-11-02 DIAGNOSIS — D129 Benign neoplasm of anus and anal canal: Secondary | ICD-10-CM | POA: Diagnosis present

## 2014-11-02 DIAGNOSIS — Z716 Tobacco abuse counseling: Secondary | ICD-10-CM | POA: Diagnosis present

## 2014-11-02 DIAGNOSIS — R58 Hemorrhage, not elsewhere classified: Secondary | ICD-10-CM | POA: Diagnosis not present

## 2014-11-02 DIAGNOSIS — E11621 Type 2 diabetes mellitus with foot ulcer: Secondary | ICD-10-CM | POA: Diagnosis present

## 2014-11-02 DIAGNOSIS — I10 Essential (primary) hypertension: Secondary | ICD-10-CM | POA: Diagnosis present

## 2014-11-02 DIAGNOSIS — C44590 Other specified malignant neoplasm of anal skin: Secondary | ICD-10-CM | POA: Diagnosis present

## 2014-11-02 DIAGNOSIS — E1165 Type 2 diabetes mellitus with hyperglycemia: Secondary | ICD-10-CM | POA: Diagnosis present

## 2014-11-02 DIAGNOSIS — Z9114 Patient's other noncompliance with medication regimen: Secondary | ICD-10-CM | POA: Diagnosis present

## 2014-11-02 DIAGNOSIS — I959 Hypotension, unspecified: Secondary | ICD-10-CM | POA: Diagnosis present

## 2014-11-02 HISTORY — DX: Malignant (primary) neoplasm, unspecified: C80.1

## 2014-11-02 HISTORY — PX: RECTAL EXAM UNDER ANESTHESIA: SHX6399

## 2014-11-02 HISTORY — PX: RECTAL BIOPSY: SHX2303

## 2014-11-02 LAB — BASIC METABOLIC PANEL
Anion gap: 2 — ABNORMAL LOW (ref 5–15)
BUN: 14 mg/dL (ref 6–20)
CO2: 24 mmol/L (ref 22–32)
Calcium: 7.5 mg/dL — ABNORMAL LOW (ref 8.9–10.3)
Chloride: 107 mmol/L (ref 101–111)
Creatinine, Ser: 1 mg/dL (ref 0.61–1.24)
GFR calc Af Amer: >60 mL/min (ref >60)
GLUCOSE: 267 mg/dL — AB (ref 65–99)
Potassium: 3.8 mmol/L (ref 3.5–5.1)
Sodium: 133 mmol/L — ABNORMAL LOW (ref 135–145)

## 2014-11-02 LAB — PREPARE RBC (CROSSMATCH)

## 2014-11-02 LAB — GLUCOSE, CAPILLARY
GLUCOSE-CAPILLARY: 271 mg/dL — AB (ref 65–99)
GLUCOSE-CAPILLARY: 344 mg/dL — AB (ref 65–99)
Glucose-Capillary: 211 mg/dL — ABNORMAL HIGH (ref 65–99)
Glucose-Capillary: 230 mg/dL — ABNORMAL HIGH (ref 65–99)
Glucose-Capillary: 326 mg/dL — ABNORMAL HIGH (ref 65–99)

## 2014-11-02 LAB — CBC
HEMATOCRIT: 22.8 % — AB (ref 40.0–52.0)
Hemoglobin: 7.4 g/dL — ABNORMAL LOW (ref 13.0–18.0)
MCH: 26.6 pg (ref 26.0–34.0)
MCHC: 32.3 g/dL (ref 32.0–36.0)
MCV: 82.5 fL (ref 80.0–100.0)
PLATELETS: 219 10*3/uL (ref 150–440)
RBC: 2.77 MIL/uL — ABNORMAL LOW (ref 4.40–5.90)
RDW: 15.9 % — AB (ref 11.5–14.5)
WBC: 8.4 10*3/uL (ref 3.8–10.6)

## 2014-11-02 LAB — HEMOGLOBIN AND HEMATOCRIT, BLOOD
HEMATOCRIT: 22.3 % — AB (ref 40.0–52.0)
HEMOGLOBIN: 7.2 g/dL — AB (ref 13.0–18.0)

## 2014-11-02 LAB — IRON AND TIBC
Iron: 225 ug/dL — ABNORMAL HIGH (ref 45–182)
SATURATION RATIOS: 74 % — AB (ref 17.9–39.5)
TIBC: 306 ug/dL (ref 250–450)
UIBC: 81 ug/dL

## 2014-11-02 SURGERY — EXAM UNDER ANESTHESIA, RECTUM
Anesthesia: General

## 2014-11-02 MED ORDER — MIDAZOLAM HCL 5 MG/5ML IJ SOLN
INTRAMUSCULAR | Status: DC | PRN
  Administered 2014-11-02: 2 mg via INTRAVENOUS

## 2014-11-02 MED ORDER — FENTANYL CITRATE (PF) 250 MCG/5ML IJ SOLN
INTRAMUSCULAR | Status: DC | PRN
  Administered 2014-11-02: 50 ug via INTRAVENOUS

## 2014-11-02 MED ORDER — SODIUM CHLORIDE 0.9 % IV SOLN
Freq: Once | INTRAVENOUS | Status: AC
  Administered 2014-11-02: 02:00:00 via INTRAVENOUS

## 2014-11-02 MED ORDER — ONDANSETRON HCL 4 MG/2ML IJ SOLN
INTRAMUSCULAR | Status: DC | PRN
  Administered 2014-11-02: 4 mg via INTRAVENOUS

## 2014-11-02 MED ORDER — ONDANSETRON HCL 4 MG/2ML IJ SOLN: 4.0000 mg | Freq: Once | INTRAMUSCULAR | Status: AC | PRN

## 2014-11-02 MED ORDER — ROCURONIUM BROMIDE 100 MG/10ML IV SOLN
INTRAVENOUS | Status: DC | PRN
  Administered 2014-11-02: 10 mg via INTRAVENOUS

## 2014-11-02 MED ORDER — SODIUM CHLORIDE 0.9 % IV SOLN
Freq: Once | INTRAVENOUS | Status: AC
  Administered 2014-11-02: 18:00:00 via INTRAVENOUS

## 2014-11-02 MED ORDER — FENTANYL CITRATE (PF) 100 MCG/2ML IJ SOLN
25.0000 ug | INTRAMUSCULAR | Status: AC | PRN
  Administered 2014-11-02 (×6): 25 ug via INTRAVENOUS

## 2014-11-02 MED ORDER — LIDOCAINE HCL (CARDIAC) 20 MG/ML IV SOLN
INTRAVENOUS | Status: DC | PRN
Start: 2014-11-02 — End: 2014-11-02
  Administered 2014-11-02: 60 mg via INTRAVENOUS

## 2014-11-02 MED ORDER — SUCCINYLCHOLINE CHLORIDE 20 MG/ML IJ SOLN
INTRAMUSCULAR | Status: DC | PRN
  Administered 2014-11-02: 100 mg via INTRAVENOUS

## 2014-11-02 MED ORDER — INSULIN GLARGINE 100 UNIT/ML ~~LOC~~ SOLN
15.0000 [IU] | Freq: Every day | SUBCUTANEOUS | Status: DC
  Administered 2014-11-03 – 2014-11-04 (×2): 15 [IU] via SUBCUTANEOUS
  Filled 2014-11-02 (×5): qty 0.15

## 2014-11-02 MED ORDER — SODIUM CHLORIDE 0.9 % IV SOLN: Freq: Once | INTRAVENOUS | Status: DC

## 2014-11-02 MED ORDER — PROPOFOL 10 MG/ML IV BOLUS
INTRAVENOUS | Status: DC | PRN
  Administered 2014-11-02: 150 mg via INTRAVENOUS

## 2014-11-02 SURGICAL SUPPLY — 32 items
BRIEF STRETCH MATERNITY 2XLG (MISCELLANEOUS) ×3 IMPLANT
CANISTER SUCT 1200ML W/VALVE (MISCELLANEOUS) ×3 IMPLANT
DRAPE LAPAROTOMY 100X77 ABD (DRAPES) ×3 IMPLANT
DRAPE LEGGINS SURG 28X43 STRL (DRAPES) IMPLANT
DRAPE TABLE BACK 80X90 (DRAPES) ×3 IMPLANT
ELECT CAUTERY NEEDLE TIP 1.0 (MISCELLANEOUS) ×3
ELECTRODE CAUTERY NEDL TIP 1.0 (MISCELLANEOUS) ×1 IMPLANT
GAUZE PACKING IODOFORM 1X5 (MISCELLANEOUS) IMPLANT
GLOVE BIO SURGEON STRL SZ7.5 (GLOVE) ×15 IMPLANT
GLOVE INDICATOR 8.0 STRL GRN (GLOVE) IMPLANT
GOWN STRL REUS W/ TWL LRG LVL3 (GOWN DISPOSABLE) ×3 IMPLANT
GOWN STRL REUS W/TWL LRG LVL3 (GOWN DISPOSABLE) ×6
HANDLE YANKAUER SUCT BULB TIP (MISCELLANEOUS) ×3 IMPLANT
JELLY LUB 2OZ STRL (MISCELLANEOUS) ×2
JELLY LUBE 2OZ STRL (MISCELLANEOUS) ×1 IMPLANT
KIT RM TURNOVER CYSTO AR (KITS) ×3 IMPLANT
LABEL OR SOLS (LABEL) ×3 IMPLANT
NS IRRIG 500ML POUR BTL (IV SOLUTION) ×3 IMPLANT
PACK BASIN MINOR ARMC (MISCELLANEOUS) ×3 IMPLANT
PAD GROUND ADULT SPLIT (MISCELLANEOUS) ×3 IMPLANT
PAD OB MATERNITY 4.3X12.25 (PERSONAL CARE ITEMS) ×3 IMPLANT
PAD PREP 24X41 OB/GYN DISP (PERSONAL CARE ITEMS) ×3 IMPLANT
SOL PREP PVP 2OZ (MISCELLANEOUS) ×3
SOLUTION PREP PVP 2OZ (MISCELLANEOUS) ×1 IMPLANT
SPONGE XRAY 4X4 16PLY STRL (MISCELLANEOUS) ×6 IMPLANT
SUT CHROMIC 3 0 SH 27 (SUTURE) ×6 IMPLANT
SUT VIC AB 2-0 SH 27 (SUTURE)
SUT VIC AB 2-0 SH 27XBRD (SUTURE) IMPLANT
SUT VIC AB 3-0 SH 27 (SUTURE)
SUT VIC AB 3-0 SH 27X BRD (SUTURE) IMPLANT
TUBING CONNECTING 10 (TUBING) ×2 IMPLANT
TUBING CONNECTING 10' (TUBING) ×1

## 2014-11-02 NOTE — Anesthesia Postprocedure Evaluation (Signed)
  Anesthesia Post-op Note  Patient: [name]  Procedure(s) Performed: Procedure(s): RECTAL EXAM UNDER ANESTHESIA (N/A) BIOPSY RECTAL (N/A)  Anesthesia type:General  Patient location: PACU  Post pain: Pain level controlled  Post assessment: Post-op Vital signs reviewed, Patient's Cardiovascular Status Stable, Respiratory Function Stable, Patent Airway and No signs of Nausea or vomiting  Post vital signs: Reviewed and stable  Last Vitals:  Filed Vitals:   11/02/14 1457  BP: 114/71  Pulse: 87  Temp: 36.8 C  Resp: 18    Level of consciousness: awake, alert  and patient cooperative  Complications: No apparent anesthesia complications

## 2014-11-02 NOTE — Brief Op Note (Signed)
11/01/2014 - 11/02/2014  1:11 PM  PATIENT:  [name]  64 y.o. male  PRE-OPERATIVE DIAGNOSIS:  Perianal mass x 2  POST-OPERATIVE DIAGNOSIS:  Perianal mass x 2  PROCEDURE:   Rectal exam under anesthesia Excision of pedunculated perianal mass Biopsy x 2 of sessile perianal mass  SURGEON:  Surgeon(s) and Role:    * Marlyce Huge, MD - Primary  PHYSICIAN ASSISTANT:   ASSISTANTS: none   ANESTHESIA:   general  EBL:  Total I/O In: 1130 [I.V.:1130] Out: - 10 ml  BLOOD ADMINISTERED:none  DRAINS: none   LOCAL MEDICATIONS USED:  NONE  SPECIMEN:  Excision  DISPOSITION OF SPECIMEN:  PATHOLOGY  COUNTS:  YES  TOURNIQUET:  * No tourniquets in log *  DICTATION: .Note written in EPIC  PLAN OF CARE: Admit to inpatient   PATIENT DISPOSITION:  PACU - hemodynamically stable.   Delay start of Pharmacological VTE agent (>24hrs) due to surgical blood loss or risk of bleeding: not applicable

## 2014-11-02 NOTE — Anesthesia Procedure Notes (Signed)
Procedure Name: Intubation Date/Time: 11/02/2014 12:30 PM Performed by: Delaney Meigs Pre-anesthesia Checklist: Patient identified, Emergency Drugs available, Suction available, Patient being monitored and Timeout performed Patient Re-evaluated:Patient Re-evaluated prior to inductionOxygen Delivery Method: Circle system utilized Preoxygenation: Pre-oxygenation with 100% oxygen Intubation Type: IV induction Ventilation: Mask ventilation without difficulty Laryngoscope Size: Mac and 3 Grade View: Grade II Tube type: Reinforced Tube size: 7.5 mm Number of attempts: 1 Airway Equipment and Method: Stylet Placement Confirmation: ETT inserted through vocal cords under direct vision,  positive ETCO2 and breath sounds checked- equal and bilateral Secured at: 22 cm Tube secured with: Tape Dental Injury: Teeth and Oropharynx as per pre-operative assessment

## 2014-11-02 NOTE — Progress Notes (Signed)
Inpatient Diabetes Program Recommendations  AACE/ADA: New Consensus Statement on Inpatient Glycemic Control (2013)  Target Ranges:  Prepandial:   less than 140 mg/dL      Peak postprandial:   less than 180 mg/dL (1-2 hours)      Critically ill patients:  140 - 180 mg/dL   Results for TRAMARION, MCPHEETERS (MRN [id number]) as of 11/02/2014 11:06  Ref. Range 11/01/2014 17:10 11/01/2014 18:27 11/01/2014 20:48 11/02/2014 00:37 11/02/2014 04:22 11/02/2014 07:55  Glucose-Capillary Latest Ref Range: 65-99 mg/dL 321 (H) 307 (H) 261 (H) 344 (H) 271 (H) 211 (H)  Results for LEVIN, PULLING (MRN [id number]) as of 11/02/2014 11:06  Ref. Range 11/01/2014 12:46  Glucose Latest Ref Range: 65-99 mg/dL 459 (H)    Diabetes history: DM2 Outpatient Diabetes medications: Metformin 500 mg BID Current orders for Inpatient glycemic control: Novolog 0-9 units Q4H  Inpatient Diabetes Program Recommendations Insulin - Basal: Initial glucose was 459 mg/dl and patient has received a total of Novolog 27 units over the past 18 hours for correction and glucose has ranged from 211-344 mg/dl since being given insulin. Please consider ordering Lantus 15 units Q24H starting now (based on 70 kg x 0.2 units).  HgbA1C: Would not recommend ordering an A1C at this point since it is not likely to be correct with low hemoglobin and patient has received blood transfusions.  Thanks, Barnie Alderman, RN, MSN, CCRN, CDE Diabetes Coordinator Inpatient Diabetes Program [phone] (Team Pager from [place] to [place]) [phone] ([facility] office) [phone] [facility] office) [phone] [facility] office)

## 2014-11-02 NOTE — Transfer of Care (Signed)
Immediate Anesthesia Transfer of Care Note  Patient: [name]  Procedure(s) Performed: Procedure(s): RECTAL EXAM UNDER ANESTHESIA (N/A) BIOPSY RECTAL (N/A)  Patient Location: PACU  Anesthesia Type:General  Level of Consciousness: awake, alert  and oriented  Airway & Oxygen Therapy: Patient Spontanous Breathing and Patient connected to face mask oxygen  Post-op Assessment: Report given to RN and Post -op Vital signs reviewed and stable  Post vital signs: Reviewed and stable  Last Vitals: 100% sat 92 hr 18resp 98.1 temp, 147/99  Filed Vitals:   11/02/14 0755  BP: 120/68  Pulse: 91  Temp: 36.9 C  Resp: 18    Complications: No apparent anesthesia complications

## 2014-11-02 NOTE — Progress Notes (Signed)
Dr. Adonis Huguenin notified of Hgb of 7.4; requested to hold Heparin SQ; new orders written; Braelynne Garinger K, RN;6:52 AM; 11/02/2014

## 2014-11-02 NOTE — Op Note (Signed)
Preoperative diagnosis: Perianal mass x 2 Postoperative diagnosis: Same Procedures performed: Anorectal exam under anesthesia Excision of pedunculated perianal mass, left Incisional biopsy of sessile perianal mass, right  Anesthesia: GETA EBL: 10 ml Specimen: As above, to pathology Complications: None  Indication for surgery: Mr. Fuhrmann is a pleasant 64 yo M who presented with perianal bleeding.  He was noted to have two perianal masses on exam.  He was brought to the OR for rectal exam under anesthesia and biopsy of these lesions.  Details of procedure.  Mr. Schram was brought to the operating room suite.  He was induced, ETT was placed and general anesthesia was administered.  He was laid prone on the OR table.  His buttocks and anus were prepped and draped.  A timeout was performed.  I then inserted a lube finger in his anus and performed anoscopic exam which showed no abnormalities.  I then examined the left sided lesion which was pedunculated.  It was excised using an ellipse to the soft tissue and the defect was closed using a 3-0 chromic running suture.  I then examined the right sided sessile lesion and two incisional biopsies were performed on it.  I then obtained hemostasis and a sterile dressing was placed. The patient was then made supine, ETT was removed and he was brought to the PACU.  There were no immediate complications.  Needle, sponge and instrument counts were correct at the end of the procedure.

## 2014-11-02 NOTE — Anesthesia Preprocedure Evaluation (Signed)
Anesthesia Evaluation  Patient identified by MRN, date of birth, ID band Patient awake    Reviewed: Allergy & Precautions, NPO status , Patient's Chart, lab work & pertinent test results, reviewed documented beta blocker date and time   Airway Mallampati: II  TM Distance: >3 FB     Dental  (+) Chipped, Edentulous Lower, Edentulous Upper   Pulmonary Current Smoker,          Cardiovascular hypertension,     Neuro/Psych    GI/Hepatic   Endo/Other  diabetes  Renal/GU      Musculoskeletal   Abdominal   Peds  Hematology   Anesthesia Other Findings Has only a few teeth.  Reproductive/Obstetrics                             Anesthesia Physical Anesthesia Plan  ASA: III  Anesthesia Plan: General   Post-op Pain Management:    Induction: Intravenous  Airway Management Planned: Oral ETT  Additional Equipment:   Intra-op Plan:   Post-operative Plan:   Informed Consent: I have reviewed the patients History and Physical, chart, labs and discussed the procedure including the risks, benefits and alternatives for the proposed anesthesia with the patient or authorized representative who has indicated his/her understanding and acceptance.     Plan Discussed with: CRNA  Anesthesia Plan Comments:         Anesthesia Quick Evaluation

## 2014-11-02 NOTE — Progress Notes (Signed)
Surgery Progress Note  S: No acute issues.  No obvious bleeding. O: Blood pressure 120/68, pulse 91, temperature 98.4 F (36.9 C), temperature source Oral, resp. rate 18, height 5\' 9"  (1.753 m), weight 70.308 kg (155 lb), SpO2 100 %. GEN: NAD/A&Ox3 BUTTOCK: Pedunculated nodule on buttocks, firm flat lesion around anus  A/P 64 yo admit with bright red blood per buttocks, requiring transfusion, now resolved - to OR for rectal EUA, biopsy

## 2014-11-02 NOTE — Consult Note (Addendum)
Medical Consultation  TANUJ TENZER [id number] DOB: Sep 06, 1950 DOA: 11/01/2014 PCP: No PCP Per Patient   Requesting physician: Dr. Kevan Rosebush Date of consultation: 11/02/2014 Reason for consultation: medical management  Impression/Recommendations 1. Uncontrolled type 2 diabetes without complication: Patient is on metformin 500 mg by mouth twice a day as an outpatient. Diabetes coordinator has seen the patient in consultation. I will continue Lantus 15 units daily at bedtime as per their recommendations. She did not recommend an A1c at this point since it is not likely to be correct with low hemoglobin and patient has received blood transfusion. Hemoglobin A1c can be ordered as outpatient. Continue sliding scale insulin and ADA diet when able to take in by mouth medications.  2. Anemia: Patient may have underlying chronic anemia as well. I will order iron studies. Patient had bright red blood per by tox requiring transfusion. Continue to monitor hemoglobin.  3. Rectal mass: Patient is scheduled for OR for rectal UA and biopsy as per surgery.  4. Essential Hypertension: Patient's blood pressure is controlled currently. He is supposed to take lisinopril 20 mg daily. I can start this tomorrow.   5. Tobacco dependence: Patient is encouraged to stop smoking. Patient does want to quit. Patient is counseled for 3 minutes. Chief Complaint: Perianal bleeding.   HPI:  This is a very pleasant [age]-year-old male with a history of hypertension and diabetes who stopped taking his medications over a month ago who presented with perianal bleeding. Hospitalist was consulted for diabetes management. Patient says he stopped taking medications because he did not have any refills. He does not have a primary care physician and does not recall who prescribed these medications for him. He was taking metformin as an outpatient.  Review of Systems  Constitutional: Negative for fever, chills weight loss HENT: Negative  for ear pain, nosebleeds, congestion, facial swelling, rhinorrhea, neck pain, neck stiffness and ear discharge.   Respiratory: Negative for cough, shortness of breath, wheezing  Cardiovascular: Negative for chest pain, palpitations and leg swelling.  Gastrointestinal: Negative for heartburn, abdominal pain, vomiting, diarrhea or consitpation Genitourinary: Negative for dysuria, urgency, frequency, hematuria Musculoskeletal: Negative for back pain or joint pain Neurological: Negative for dizziness, seizures, syncope, focal weakness,  numbness and headaches.  Hematological: Does not bruise/bleed easily.  Psychiatric/Behavioral: Negative for hallucinations, confusion, dysphoric mood   Past Medical History  Diagnosis Date  . Hypertension   . Diabetes mellitus     Patient take Metformin.   Social History:  reports that he has been smoking Cigarettes.  He has a 20 pack-year smoking history. He has never used smokeless tobacco. He reports that he drinks alcohol. He reports that he does not use illicit drugs.   Surgical history: None  Family history: No history of CAD or diabetes No Known Allergies   Prior to Admission medications   Medication Sig Start Date End Date Taking? Authorizing Provider  lisinopril (PRINIVIL,ZESTRIL) 20 MG tablet Take 20 mg by mouth daily.   Yes Historical Provider, MD  metFORMIN (GLUCOPHAGE) 500 MG tablet Take 500 mg by mouth 2 (two) times daily.   Yes Historical Provider, MD    Physical Exam: Blood pressure 120/68, pulse 91, temperature 98.4 F (36.9 C), temperature source Oral, resp. rate 18, height 5\' 9"  (1.753 m), weight 70.308 kg (155 lb), SpO2 100 %. @VITALS2 @ [employer]   11/01/14 1237  Weight: 70.308 kg (155 lb)    Intake/Output Summary (Last 24 hours) at 11/02/14 1136 Last data filed at 11/02/14 0800  Gross per 24 hour  Intake   2630 ml  Output      0 ml  Net   2630 ml     Constitutional: Appears well-developed and well-nourished. No  distress. HENT: Normocephalic. Marland Kitchen Oropharynx is clear and moist.  Eyes: Conjunctivae and EOM are normal. PERRLA, no scleral icterus.  Neck: Normal ROM. Neck supple. No JVD. No tracheal deviation. CVS: RRR, S1/S2 +, no murmurs, no gallops, no carotid bruit.  Pulmonary: Effort and breath sounds normal, no stridor, rhonchi, wheezes, rales.  Abdominal: Soft. BS +,  no distension, tenderness, rebound or guarding.  Musculoskeletal: Normal range of motion. No edema and no tenderness.  Neuro: Alert. CN 2-12 grossly intact. No focal deficits. Skin: Skin is warm and dry. I did not examine anus  Psychiatric: Normal mood and affect.    Labs  Basic Metabolic Panel:  Recent Labs Lab 11/02/14 0457  NA 133*  K 3.8  CL 107  CO2 24  GLUCOSE 267*  BUN 14  CREATININE 1.00  CALCIUM 7.5*   Liver Function Tests:  Recent Labs Lab 11/01/14 1246  AST 43*  ALT 29  ALKPHOS 77  BILITOT 0.8  PROT 7.3  ALBUMIN 2.5*    Recent Labs Lab 11/01/14 1246  LIPASE 26    CBC:  Recent Labs Lab 11/01/14 1246  11/02/14 0457  WBC 11.3*  --  8.4  NEUTROABS 7.0*  --   --   HGB 7.9*  < > 7.4*  HCT 25.5*  < > 22.8*  MCV 78.5*  --  82.5  PLT 302  --  219  < > = values in this interval not displayed. Cardiac Enzymes: No results for input(s): CKTOTAL, CKMB, CKMBINDEX, TROPONINI in the last 168 hours. BNP: Invalid input(s): POCBNP CBG:  Recent Labs Lab 11/02/14 0755  GLUCAP 211*    Radiological Exams: Ct Chest W Contrast  11/01/2014   IMPRESSION: Enhancing soft tissue mass in the intergluteal cleft which has increased in size from the prior exam. Surgical consultation is recommended.  Stable retroperitoneal up para celiac and gastric pack lymph nodes which may be related to the lesion within the gluteal cleft.  Tiny right upper lobe pulmonary nodules stable in appearance from the prior exam measuring 2-3 mm.  No other acute abnormality is noted   Electronically Signed   By: Inez Catalina M.D.    On: 11/01/2014 15:54   Ct Abdomen Pelvis W Contrast  11/01/2014     IMPRESSION: Enhancing soft tissue mass in the intergluteal cleft which has increased in size from the prior exam. Surgical consultation is recommended.  Stable retroperitoneal up para celiac and gastric pack lymph nodes which may be related to the lesion within the gluteal cleft.  Tiny right upper lobe pulmonary nodules stable in appearance from the prior exam measuring 2-3 mm.  No other acute abnormality is noted   Electronically Signed   By: Inez Catalina M.D.   On: 11/01/2014 15:54       Thank you for allowing me to participate in the care of your patient. We will continue to follow.   Time spent: 75  Glorya Bartley, MD

## 2014-11-03 LAB — PREPARE RBC (CROSSMATCH)

## 2014-11-03 LAB — GLUCOSE, CAPILLARY
GLUCOSE-CAPILLARY: 252 mg/dL — AB (ref 65–99)
GLUCOSE-CAPILLARY: 287 mg/dL — AB (ref 65–99)
GLUCOSE-CAPILLARY: 382 mg/dL — AB (ref 65–99)
Glucose-Capillary: 184 mg/dL — ABNORMAL HIGH (ref 65–99)
Glucose-Capillary: 181 mg/dL — ABNORMAL HIGH (ref 65–99)
Glucose-Capillary: 319 mg/dL — ABNORMAL HIGH (ref 65–99)

## 2014-11-03 MED ORDER — HYDROCODONE-ACETAMINOPHEN 5-325 MG PO TABS: 1.0000 | ORAL_TABLET | Freq: Four times a day (QID) | ORAL | Status: DC | PRN

## 2014-11-03 MED ORDER — METFORMIN HCL 500 MG PO TABS: 500.0000 mg | ORAL_TABLET | Freq: Two times a day (BID) | ORAL | Status: DC

## 2014-11-03 MED ORDER — LISINOPRIL 20 MG PO TABS: 20.0000 mg | ORAL_TABLET | Freq: Every day | ORAL | Status: DC

## 2014-11-03 MED ORDER — HYDROCORTISONE 1 % EX CREA
TOPICAL_CREAM | Freq: Two times a day (BID) | CUTANEOUS | Status: DC
  Administered 2014-11-03 – 2014-11-04 (×3): via TOPICAL
  Filled 2014-11-03: qty 28

## 2014-11-03 NOTE — Progress Notes (Signed)
Surgery Progress Note  S: Doing well.  No acute issues O: Blood pressure 129/72, pulse 108, temperature 99.4 F (37.4 C), temperature source Oral, resp. rate 16, height 5\' 9"  (1.753 m), weight 70.308 kg (155 lb), SpO2 98 %. GEN: NAD/A&Ox3 BUTTOCK: No obvious bleeding  A/P 64 yo s/p rectal EUA, biopsy - possible home today

## 2014-11-03 NOTE — Progress Notes (Signed)
[place] [facility] [place] NAME: [name]    MR#:  [id number]  DATE OF BIRTH:  October 21, 1950  SUBJECTIVE:  Patient doing well this am no issues overnight POD #1  REVIEW OF SYSTEMS:    Review of Systems  Constitutional: Negative for fever, chills and malaise/fatigue.  HENT: Negative for sore throat.   Eyes: Negative for blurred vision.  Respiratory: Negative for cough, hemoptysis, shortness of breath and wheezing.   Cardiovascular: Negative for chest pain, palpitations and leg swelling.  Gastrointestinal: Negative for nausea, vomiting, abdominal pain, diarrhea and blood in stool.  Genitourinary: Negative for dysuria.  Musculoskeletal: Negative for back pain.  Neurological: Negative for dizziness, tremors and headaches.  Endo/Heme/Allergies: Does not bruise/bleed easily.    Tolerating Diet:yes      DRUG ALLERGIES:  No Known Allergies  VITALS:  Blood pressure 132/86, pulse 100, temperature 97.8 F (36.6 C), temperature source Oral, resp. rate 16, height 5\' 9"  (1.753 m), weight 70.308 kg (155 lb), SpO2 99 %.  PHYSICAL EXAMINATION:   Physical Exam  Constitutional: He is oriented to person, place, and time and well-developed, well-nourished, and in no distress. No distress.  HENT:  Head: Normocephalic.  Eyes: No scleral icterus.  Neck: Normal range of motion. Neck supple. No JVD present. No tracheal deviation present.  Cardiovascular: Normal rate, regular rhythm and normal heart sounds.  Exam reveals no gallop and no friction rub.   No murmur heard. Pulmonary/Chest: Effort normal and breath sounds normal. No respiratory distress. He has no wheezes. He has no rales. He exhibits no tenderness.  Abdominal: Soft. Bowel sounds are normal. He exhibits no distension and no mass. There is no tenderness. There is no rebound and no guarding.  Musculoskeletal: Normal range of motion. He exhibits no edema.  Neurological: He is alert and  oriented to person, place, and time.  Skin: Skin is warm. No rash noted. No erythema.  Psychiatric: Affect and judgment normal.      LABORATORY PANEL:   CBC  Recent Labs Lab 11/02/14 0457 11/02/14 1139  WBC 8.4  --   HGB 7.4* 7.2*  HCT 22.8* 22.3*  PLT 219  --    ------------------------------------------------------------------------------------------------------------------  Chemistries   Recent Labs Lab 11/01/14 1246 11/02/14 0457  NA 124* 133*  K 4.0 3.8  CL 93* 107  CO2 24 24  GLUCOSE 459* 267*  BUN 13 14  CREATININE 1.25* 1.00  CALCIUM 8.1* 7.5*  AST 43*  --   ALT 29  --   ALKPHOS 77  --   BILITOT 0.8  --    ------------------------------------------------------------------------------------------------------------------  Cardiac Enzymes No results for input(s): TROPONINI in the last 168 hours. ------------------------------------------------------------------------------------------------------------------  RADIOLOGY:  Ct Chest W Contrast  11/01/2014   CLINICAL DATA:  Growth in the gluteal cleft.  Marland Kitchen  EXAM: CT CHEST, ABDOMEN, AND PELVIS WITH CONTRAST  TECHNIQUE: Multidetector CT imaging of the chest, abdomen and pelvis was performed following the standard protocol during bolus administration of intravenous contrast.  CONTRAST:  123mL OMNIPAQUE IOHEXOL 300 MG/ML  SOLN  COMPARISON:  08/31/2014  FINDINGS: CT CHEST FINDINGS  The lungs are well aerated bilaterally. The previously seen right upper lobe nodule is again visualized best on the sagittal reconstructions (image 62 of series 8). No other nodules are seen. No focal infiltrate or sizable effusion is noted.  The thoracic inlet is unremarkable. Calcifications of the thoracic aorta are seen without aneurysmal dilatation or dissection. No hilar or mediastinal adenopathy  is noted. Some nonspecific lymph nodes are noted in the axilla bilaterally. These have increased in size when compare with the prior exam. The  largest of these on the right measures 9 mm. The largest of these on the left measures 8 mm. Majority of these demonstrate normal fatty hila and are likely reactive in nature.  CT ABDOMEN AND PELVIS FINDINGS  The liver, spleen, gallbladder, adrenal glands and pancreas are within normal limits. The kidneys are well visualized bilaterally and reveal no renal calculi or obstructive change. Scattered lymph nodes are noted in the region of the celiac axis and gastrohepatic ligament as well as in the retroperitoneum. Largest node in the retroperitoneum measures almost 16 mm in short axis lying in the intra aortocaval region best seen on image number 76 of series 3. The largest of the nodes near the celiac axis measures 14 mm in short axis best seen on image number 62 of series 3. 10 mm gastrohepatic ligament node is noted best seen on image 58 of series 3. These appear to be unchanged from the prior exam.  Aortoiliac calcifications are noted without aneurysmal dilatation. The appendix is well visualized and within normal limits. No pelvic mass lesion is seen. The bladder is well distended and within normal limits. No acute bony abnormality is noted.  In the intergluteal cleft there is a soft tissue lesion identified which demonstrates enhancement in corresponds with that seen on the prior CT examination. This lesion now measures 29 mm increased in size from 19 mm on the prior exam. Surgical consultation is recommended.  IMPRESSION: Enhancing soft tissue mass in the intergluteal cleft which has increased in size from the prior exam. Surgical consultation is recommended.  Stable retroperitoneal up para celiac and gastric pack lymph nodes which may be related to the lesion within the gluteal cleft.  Tiny right upper lobe pulmonary nodules stable in appearance from the prior exam measuring 2-3 mm.  No other acute abnormality is noted   Electronically Signed   By: Inez Catalina M.D.   On: 11/01/2014 15:54   Ct Abdomen Pelvis  W Contrast  11/01/2014   CLINICAL DATA:  Growth in the gluteal cleft.  Marland Kitchen  EXAM: CT CHEST, ABDOMEN, AND PELVIS WITH CONTRAST  TECHNIQUE: Multidetector CT imaging of the chest, abdomen and pelvis was performed following the standard protocol during bolus administration of intravenous contrast.  CONTRAST:  181mL OMNIPAQUE IOHEXOL 300 MG/ML  SOLN  COMPARISON:  08/31/2014  FINDINGS: CT CHEST FINDINGS  The lungs are well aerated bilaterally. The previously seen right upper lobe nodule is again visualized best on the sagittal reconstructions (image 62 of series 8). No other nodules are seen. No focal infiltrate or sizable effusion is noted.  The thoracic inlet is unremarkable. Calcifications of the thoracic aorta are seen without aneurysmal dilatation or dissection. No hilar or mediastinal adenopathy is noted. Some nonspecific lymph nodes are noted in the axilla bilaterally. These have increased in size when compare with the prior exam. The largest of these on the right measures 9 mm. The largest of these on the left measures 8 mm. Majority of these demonstrate normal fatty hila and are likely reactive in nature.  CT ABDOMEN AND PELVIS FINDINGS  The liver, spleen, gallbladder, adrenal glands and pancreas are within normal limits. The kidneys are well visualized bilaterally and reveal no renal calculi or obstructive change. Scattered lymph nodes are noted in the region of the celiac axis and gastrohepatic ligament as well as in the retroperitoneum.  Largest node in the retroperitoneum measures almost 16 mm in short axis lying in the intra aortocaval region best seen on image number 76 of series 3. The largest of the nodes near the celiac axis measures 14 mm in short axis best seen on image number 62 of series 3. 10 mm gastrohepatic ligament node is noted best seen on image 58 of series 3. These appear to be unchanged from the prior exam.  Aortoiliac calcifications are noted without aneurysmal dilatation. The appendix is  well visualized and within normal limits. No pelvic mass lesion is seen. The bladder is well distended and within normal limits. No acute bony abnormality is noted.  In the intergluteal cleft there is a soft tissue lesion identified which demonstrates enhancement in corresponds with that seen on the prior CT examination. This lesion now measures 29 mm increased in size from 19 mm on the prior exam. Surgical consultation is recommended.  IMPRESSION: Enhancing soft tissue mass in the intergluteal cleft which has increased in size from the prior exam. Surgical consultation is recommended.  Stable retroperitoneal up para celiac and gastric pack lymph nodes which may be related to the lesion within the gluteal cleft.  Tiny right upper lobe pulmonary nodules stable in appearance from the prior exam measuring 2-3 mm.  No other acute abnormality is noted   Electronically Signed   By: Inez Catalina M.D.   On: 11/01/2014 15:54     ASSESSMENT AND PLAN:  64 y/o m with HTN and DM presented with rectal bleeding found to have perianal mass s/p excision and bx.   1.  Type 2 diabetes without complication: Patient is on metformin 500 mg by mouth twice a day as an outpatient.He should continue on this medication at discharge and follow up with his PCP for diabetes and A1c.   2. Anemia: Hgb 7/2 this am. He received 2 units of PRBC. His iron studies are c/w chronic disease. He will need outpatient follow up and needs outpatient c/s if he has not had one as per preventitive care.  3. Rectal mass: POD #1 f/u sirgery  4. Essential Hypertension: Patient's blood pressure is controlled currently. Continue lisinopril 20 mg daily as outpatient dose.   5. Tobacco dependence: Patient is encouraged to stop smoking. Patient does want to quit. Patient is counseled for 3 minutes. Can be discharged with patch.  Thank you for allowing me to participate inc are of your patient. Plan for likely d/c today as per notes. Call if you have  further questions, I will sign off.      Management plans discussed with the patient and he is in agreement.  CODE STATUS: FULL  TOTAL TIME TAKING CARE OF THIS PATIENT: 31 minutes.   Greater than 50% counseling and coordination of care  POSSIBLE D/C TODAY , DEPENDING ON CLINICAL CONDITION.   Aneri Slagel M.D on 11/03/2014 at 9:08 AM  Between 7am to 6pm - Pager - [phone] After 6pm go to [email or web address] - password [email or web address]  [facility]  Office  [phone]  CC: Primary care physician; No PCP Per Patient

## 2014-11-03 NOTE — Consult Note (Signed)
WOC wound consult note Reason for Consult: Mass to perirectal area, followed by surgery.  Bedside nurse indicated that NS moist gauze dressing ordered by MD.  Dry skin with scabbed lesions to left upper thigh.  Will cleanse with soap and water and apply barrier cream.  Right great toe, plantar surface neuropathic ulcer, present on admission.  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  Pressure Ulcer POA: Yes Right great toe Measurement: 2 cm x 1.5 cm x 0.3 cm Wound bed:50% ruddy red and 50% adherent slough.   Drainage (amount, consistency, odor) Moderate serosanguinous drainage to right toe.  Foul odor. Recommend podiatry consult for evaluation.  Periwound:Maceration Dressing procedure/placement/frequency:Cleanse right great toe with NS and pat gently dry.  Apply Iodoform packing strip to wound bed.  Cover with 4x4 gauze, wrap gauze and tape.  Change daily. Recommend podiatry consult.  Will not follow at this time.  Please re-consult if needed.  Domenic Moras RN BSN [place] Pager [phone]

## 2014-11-04 ENCOUNTER — Telehealth: Payer: Self-pay

## 2014-11-04 LAB — GLUCOSE, CAPILLARY
GLUCOSE-CAPILLARY: 175 mg/dL — AB (ref 65–99)
GLUCOSE-CAPILLARY: 244 mg/dL — AB (ref 65–99)
Glucose-Capillary: 127 mg/dL — ABNORMAL HIGH (ref 65–99)
Glucose-Capillary: 172 mg/dL — ABNORMAL HIGH (ref 65–99)
Glucose-Capillary: 235 mg/dL — ABNORMAL HIGH (ref 65–99)
Glucose-Capillary: 284 mg/dL — ABNORMAL HIGH (ref 65–99)

## 2014-11-04 LAB — SURGICAL PATHOLOGY

## 2014-11-04 MED ORDER — HYDROCODONE-ACETAMINOPHEN 5-325 MG PO TABS: 1.0000 | ORAL_TABLET | Freq: Four times a day (QID) | ORAL | Status: DC | PRN

## 2014-11-04 MED ORDER — HYDROCORTISONE 1 % EX CREA: TOPICAL_CREAM | Freq: Two times a day (BID) | CUTANEOUS | Status: DC

## 2014-11-04 NOTE — Telephone Encounter (Signed)
Spoke with Dr. Reuel Derby at this time.  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  Readback was completed on the phone with Dr. Reuel Derby  Call was made to Dr. Rexene Edison at this time and pathology results were reviewed with him. No further orders given at this time. Patient to follow-up in office on 11/11/14.

## 2014-11-04 NOTE — Consult Note (Signed)
ORTHOPAEDIC CONSULTATION  REQUESTING PHYSICIAN: Marlyce Huge, MD  Chief Complaint: Diabetic foot ulcer  HPI: [name] is a 64 y.o. male who complains of  sore to his right great toe. He states he had a callus on his right great toe when he cut this off recently. Was seen by the wound care nurse and there was noted to be drainage and odor and this ulcer. Orders were placed for wound care and I was consult to for my opinion. He states he has a lot of numbness to the lower extremities. He was admitted blood sugars in the 400 range.  Past Medical History  Diagnosis Date  . Hypertension   . Diabetes mellitus     Patient take Metformin.   Past Surgical History  Procedure Laterality Date  . Rectal exam under anesthesia N/A 11/02/2014    Procedure: RECTAL EXAM UNDER ANESTHESIA;  Surgeon: Marlyce Huge, MD;  Location: [facility] ORS;  Service: General;  Laterality: N/A;  . Rectal biopsy N/A 11/02/2014    Procedure: BIOPSY RECTAL;  Surgeon: Marlyce Huge, MD;  Location: [facility] ORS;  Service: General;  Laterality: N/A;   History   Social History  . Marital Status: Single    Spouse Name: N/A  . Number of Children: N/A  . Years of Education: N/A   Social History Main Topics  . Smoking status: Current Every Day Smoker -- 1.00 packs/day for 20 years    Types: Cigarettes  . Smokeless tobacco: Never Used  . Alcohol Use: Yes  . Drug Use: No  . Sexual Activity: Not on file   Other Topics Concern  . None   Social History Narrative   History reviewed. No pertinent family history. No Known Allergies Prior to Admission medications   Medication Sig Start Date End Date Taking? Authorizing Provider  HYDROcodone-acetaminophen (NORCO/VICODIN) 5-325 MG per tablet Take 1 tablet by mouth every 6 (six) hours as needed for moderate pain. 11/03/14   Marlyce Huge, MD  lisinopril (PRINIVIL,ZESTRIL) 20 MG tablet Take 1 tablet (20 mg total) by mouth daily. 11/03/14   Bettey Costa, MD  metFORMIN (GLUCOPHAGE) 500 MG tablet Take 1 tablet (500 mg total) by mouth 2 (two) times daily. 11/03/14   Bettey Costa, MD   No results found.  Positive ROS: All other systems have been reviewed and were otherwise negative with the exception of those mentioned in the HPI and as above.  12 point ROS was performed.  Physical Exam: General: Alert and oriented.  No apparent distress.  Vascular:  Left foot:Dorsalis Pedis:  present Posterior Tibial:  present  Right foot: Dorsalis Pedis:  present Posterior Tibial:  present  Neuro: Protective sensation:absent  Derm: His right great toe at the interphalangeal joint has a fascial 2 cm x 1.5 cm ulceration. A nice healthy wound bed at this time. Granular tissue is noted. No foul odor is noted at this time. No areas of probing. There is no proximal lymphangitis or cellulitis.  Ortho/MS: He has mild diffuse bilateral lower extremity edema. Focal edema to the right great toe. No concern for osteomyelitis to this right foot.   Assessment: Diabetic foot ulcer right great toe  Plan: The wound has been cleaned and most of the drainage has decided at this point. Today I covered this with a padded absorbent dressing. He will leave this intact for approximately 2 days. He can then begin home dressing changes with antibiotics ointment and a bandage. I instructed him on performing this and he states he  does have bandage changes at home. Like to see him back in the office in 1 week.    Elesa Hacker, DPM Cell [phone]   11/04/2014 7:49 AM

## 2014-11-04 NOTE — Progress Notes (Signed)
Inpatient Diabetes Program Recommendations  AACE/ADA: New Consensus Statement on Inpatient Glycemic Control (2013)  Target Ranges:  Prepandial:   less than 140 mg/dL      Peak postprandial:   less than 180 mg/dL (1-2 hours)      Critically ill patients:  140 - 180 mg/dL   Results for [name], [name] (MRN [id number]) as of 11/04/2014 10:53  Ref. Range 11/03/2014 00:23 11/03/2014 04:56 11/03/2014 07:28 11/03/2014 11:14 11/03/2014 16:22 11/03/2014 20:09 11/04/2014 00:21 11/04/2014 04:30 11/04/2014 07:34  Glucose-Capillary Latest Ref Range: 65-99 mg/dL 252 (H) 184 (H) 181 (H) 382 (H) 319 (H) 287 (H) 235 (H) 175 (H) 127 (H)   Diabetes history: DM2 Outpatient Diabetes medications: Metformin 500 mg BID Current orders for Inpatient glycemic control: Lantus 15 units daily, Novolog 0-9 units Q4H  Inpatient Diabetes Program Recommendations Insulin - Basal: Lantus 15 units was started yesterday and given at 11:26 am on 7/27. Patient has received a total of Novolog 18 units for correction since patient was given Lantus on 7/27. Glucose has ranged from 127-382 mg/dl over the past 24 hours. Please consider increasing Lantus to 20 units daily. Since patient has already received Lantus 15 units this morning at 9:33 am would recommend ordering an additional Lantus 5 units x 1 now (for total of 20 units for today). Outpatient DM medications: Patient is only taking Metformin 500 mg BID as an outpatient for glycemic control. Anticipate patient needs additional diabetes medications to improve glycemic control. May want to consider discharging on insulin. If plan will be to discharge patient on insulin, please inform patient, nursing staff, and consult inpatient diabetes coordinator.  Thanks, Barnie Alderman, RN, MSN, CCRN, CDE Diabetes Coordinator Inpatient Diabetes Program [phone] (Team Pager from [place] to [place]) [phone] ([facility] office) [phone] [facility] office) [phone] [facility] office)

## 2014-11-04 NOTE — Discharge Instructions (Signed)
Do not drive on pain medications Call or return to ER if you develop fever greater than 101.5, nausea/vomiting, increased pain, redness/drainage/bleeding from incisions

## 2014-11-05 LAB — TYPE AND SCREEN
ABO/RH(D): A POS
Antibody Screen: NEGATIVE
UNIT DIVISION: 0
UNIT DIVISION: 0
Unit division: 0
Unit division: 0

## 2014-11-06 NOTE — Discharge Summary (Signed)
Discharge diagnosis: Bleeding pedunculated perianal mass.  Sessile perianal mass. Procedure performed: Rectal exam under anesthesia Excision of pedunculated perianal mass Incisional biopsy of sessile perianal mass    Medication List    TAKE these medications        HYDROcodone-acetaminophen 5-325 MG per tablet  Commonly known as:  NORCO/VICODIN  Take 1 tablet by mouth every 6 (six) hours as needed for moderate pain.     hydrocortisone cream 1 %  Apply topically 2 (two) times daily.     lisinopril 20 MG tablet  Commonly known as:  PRINIVIL,ZESTRIL  Take 1 tablet (20 mg total) by mouth daily.     metFORMIN 500 MG tablet  Commonly known as:  GLUCOPHAGE  Take 1 tablet (500 mg total) by mouth 2 (two) times daily.       Hospital Course: Mr. Bessett was admitted for bleeding mass on his buttock.  It had stopped bleeding at admission, then resumed bleeding.  At one time he became hypotensive and required transfusion.  The mass was suture ligated.  On HD 2 he went to the OR for rectal exam, excision of bleeding mass and biopsy of another, sessile perianal mass.  Postoperatively he was given PO pain meds, which adequately addressed his pain.  He also had investigation of a ventral diabetic toe ulcer by Dr. Vickki Muff, who recommended no operative intervention and local wound control.  At time of discharge, Mr. Lord was hemodynamically stable, tolerating a regular diet and having good PO pain control.

## 2014-11-11 ENCOUNTER — Ambulatory Visit (INDEPENDENT_AMBULATORY_CARE_PROVIDER_SITE_OTHER): Payer: Medicaid Other | Admitting: Surgery

## 2014-11-11 ENCOUNTER — Other Ambulatory Visit: Payer: Self-pay

## 2014-11-11 ENCOUNTER — Inpatient Hospital Stay
Admission: EM | Admit: 2014-11-11 | Discharge: 2014-11-12 | DRG: 812 | Disposition: A | Discharge status: Home / Self Care | Payer: Medicaid Other | Attending: Internal Medicine | Admitting: Internal Medicine

## 2014-11-11 ENCOUNTER — Encounter: Payer: Self-pay | Admitting: Surgery

## 2014-11-11 ENCOUNTER — Emergency Department: Payer: Medicaid Other

## 2014-11-11 ENCOUNTER — Ambulatory Visit: Payer: Self-pay | Admitting: General Surgery

## 2014-11-11 ENCOUNTER — Telehealth: Payer: Self-pay

## 2014-11-11 ENCOUNTER — Encounter: Payer: Self-pay | Admitting: Emergency Medicine

## 2014-11-11 VITALS — BP 151/74 | HR 109 | Temp 98.7°F | Ht 69.0 in | Wt 187.0 lb

## 2014-11-11 DIAGNOSIS — R609 Edema, unspecified: Secondary | ICD-10-CM

## 2014-11-11 DIAGNOSIS — F17210 Nicotine dependence, cigarettes, uncomplicated: Secondary | ICD-10-CM | POA: Diagnosis present

## 2014-11-11 DIAGNOSIS — Z825 Family history of asthma and other chronic lower respiratory diseases: Secondary | ICD-10-CM

## 2014-11-11 DIAGNOSIS — K625 Hemorrhage of anus and rectum: Secondary | ICD-10-CM

## 2014-11-11 DIAGNOSIS — L989 Disorder of the skin and subcutaneous tissue, unspecified: Secondary | ICD-10-CM | POA: Diagnosis present

## 2014-11-11 DIAGNOSIS — E119 Type 2 diabetes mellitus without complications: Secondary | ICD-10-CM | POA: Diagnosis present

## 2014-11-11 DIAGNOSIS — R0602 Shortness of breath: Secondary | ICD-10-CM

## 2014-11-11 DIAGNOSIS — D649 Anemia, unspecified: Secondary | ICD-10-CM

## 2014-11-11 DIAGNOSIS — R600 Localized edema: Secondary | ICD-10-CM | POA: Diagnosis present

## 2014-11-11 DIAGNOSIS — C4490 Unspecified malignant neoplasm of skin, unspecified: Secondary | ICD-10-CM

## 2014-11-11 DIAGNOSIS — I10 Essential (primary) hypertension: Secondary | ICD-10-CM | POA: Diagnosis present

## 2014-11-11 DIAGNOSIS — M7989 Other specified soft tissue disorders: Secondary | ICD-10-CM

## 2014-11-11 DIAGNOSIS — D62 Acute posthemorrhagic anemia: Principal | ICD-10-CM | POA: Diagnosis present

## 2014-11-11 DIAGNOSIS — I509 Heart failure, unspecified: Secondary | ICD-10-CM

## 2014-11-11 DIAGNOSIS — R062 Wheezing: Secondary | ICD-10-CM

## 2014-11-11 HISTORY — DX: Heart failure, unspecified: I50.9

## 2014-11-11 LAB — COMPREHENSIVE METABOLIC PANEL
ALBUMIN: 2.2 g/dL — AB (ref 3.5–5.0)
ALT: 22 U/L (ref 17–63)
AST: 45 U/L — AB (ref 15–41)
Alkaline Phosphatase: 68 U/L (ref 38–126)
Anion gap: 6 (ref 5–15)
BILIRUBIN TOTAL: 0.5 mg/dL (ref 0.3–1.2)
BUN: 11 mg/dL (ref 6–20)
CALCIUM: 7.6 mg/dL — AB (ref 8.9–10.3)
CO2: 26 mmol/L (ref 22–32)
Chloride: 98 mmol/L — ABNORMAL LOW (ref 101–111)
Creatinine, Ser: 0.82 mg/dL (ref 0.61–1.24)
GFR calc Af Amer: >60 mL/min (ref >60)
GFR calc non Af Amer: >60 mL/min (ref >60)
Glucose, Bld: 211 mg/dL — ABNORMAL HIGH (ref 65–99)
Potassium: 4 mmol/L (ref 3.5–5.1)
Sodium: 130 mmol/L — ABNORMAL LOW (ref 135–145)
Total Protein: 7 g/dL (ref 6.5–8.1)

## 2014-11-11 LAB — CBC WITH DIFFERENTIAL/PLATELET
BASOS ABS: 0.1 10*3/uL (ref 0–0.1)
BASOS PCT: 1 %
Eosinophils Absolute: 0.6 10*3/uL (ref 0–0.7)
Eosinophils Relative: 8 %
HEMATOCRIT: 21 % — AB (ref 40.0–52.0)
HEMOGLOBIN: 6.7 g/dL — AB (ref 13.0–18.0)
Lymphocytes Relative: 13 %
Lymphs Abs: 1.1 10*3/uL (ref 1.0–3.6)
MCH: 25.9 pg — ABNORMAL LOW (ref 26.0–34.0)
MCHC: 31.8 g/dL — ABNORMAL LOW (ref 32.0–36.0)
MCV: 81.5 fL (ref 80.0–100.0)
MONO ABS: 1.1 10*3/uL — AB (ref 0.2–1.0)
MONOS PCT: 13 %
NEUTROS ABS: 5.6 10*3/uL (ref 1.4–6.5)
NEUTROS PCT: 65 %
PLATELETS: 277 10*3/uL (ref 150–440)
RBC: 2.57 MIL/uL — AB (ref 4.40–5.90)
RDW: 16.8 % — ABNORMAL HIGH (ref 11.5–14.5)
WBC: 8.5 10*3/uL (ref 3.8–10.6)

## 2014-11-11 LAB — GLUCOSE, CAPILLARY: GLUCOSE-CAPILLARY: 215 mg/dL — AB (ref 65–99)

## 2014-11-11 LAB — TROPONIN I: Troponin I: <0.03 ng/mL (ref <0.031)

## 2014-11-11 LAB — BRAIN NATRIURETIC PEPTIDE: B Natriuretic Peptide: 531 pg/mL — ABNORMAL HIGH (ref 0.0–100.0)

## 2014-11-11 LAB — PREPARE RBC (CROSSMATCH)

## 2014-11-11 MED ORDER — INSULIN ASPART 100 UNIT/ML ~~LOC~~ SOLN
0.0000 [IU] | Freq: Every day | SUBCUTANEOUS | Status: DC
  Administered 2014-11-11: 23:00:00 2 [IU] via SUBCUTANEOUS
  Filled 2014-11-11: qty 2

## 2014-11-11 MED ORDER — METFORMIN HCL 500 MG PO TABS
500.0000 mg | ORAL_TABLET | Freq: Two times a day (BID) | ORAL | Status: DC
  Administered 2014-11-12 (×2): 500 mg via ORAL
  Filled 2014-11-11 (×3): qty 1

## 2014-11-11 MED ORDER — FUROSEMIDE 10 MG/ML IJ SOLN
20.0000 mg | Freq: Once | INTRAMUSCULAR | Status: AC
  Administered 2014-11-11: 20 mg via INTRAVENOUS
  Filled 2014-11-11: qty 4

## 2014-11-11 MED ORDER — ONDANSETRON HCL 4 MG/2ML IJ SOLN: 4.0000 mg | Freq: Four times a day (QID) | INTRAMUSCULAR | Status: DC | PRN

## 2014-11-11 MED ORDER — INSULIN ASPART 100 UNIT/ML ~~LOC~~ SOLN
0.0000 [IU] | Freq: Three times a day (TID) | SUBCUTANEOUS | Status: DC
  Administered 2014-11-12: 2 [IU] via SUBCUTANEOUS
  Administered 2014-11-12 (×2): 3 [IU] via SUBCUTANEOUS
  Filled 2014-11-11: qty 2
  Filled 2014-11-11 (×2): qty 3

## 2014-11-11 MED ORDER — SODIUM CHLORIDE 0.9 % IV SOLN
10.0000 mL/h | Freq: Once | INTRAVENOUS | Status: AC
  Administered 2014-11-11: 10 mL/h via INTRAVENOUS

## 2014-11-11 MED ORDER — ACETAMINOPHEN 325 MG PO TABS: 650.0000 mg | ORAL_TABLET | Freq: Four times a day (QID) | ORAL | Status: DC | PRN

## 2014-11-11 MED ORDER — ASPIRIN EC 81 MG PO TBEC
81.0000 mg | DELAYED_RELEASE_TABLET | Freq: Every day | ORAL | Status: DC
  Administered 2014-11-11 – 2014-11-12 (×2): 81 mg via ORAL
  Filled 2014-11-11 (×2): qty 1

## 2014-11-11 MED ORDER — HYDROCORTISONE 1 % EX CREA
TOPICAL_CREAM | Freq: Two times a day (BID) | CUTANEOUS | Status: DC
  Administered 2014-11-12: 09:00:00 via TOPICAL
  Filled 2014-11-11: qty 28

## 2014-11-11 MED ORDER — ONDANSETRON HCL 4 MG PO TABS
4.0000 mg | ORAL_TABLET | Freq: Four times a day (QID) | ORAL | Status: DC | PRN
Start: 2014-11-11 — End: 2014-11-12

## 2014-11-11 MED ORDER — HYDROCODONE-ACETAMINOPHEN 5-325 MG PO TABS: 1.0000 | ORAL_TABLET | Freq: Four times a day (QID) | ORAL | Status: DC | PRN

## 2014-11-11 MED ORDER — DIPHENHYDRAMINE HCL 25 MG PO CAPS
25.0000 mg | ORAL_CAPSULE | Freq: Four times a day (QID) | ORAL | Status: DC | PRN
  Administered 2014-11-11 – 2014-11-12 (×3): 25 mg via ORAL
  Filled 2014-11-11 (×3): qty 1

## 2014-11-11 MED ORDER — LISINOPRIL 20 MG PO TABS
20.0000 mg | ORAL_TABLET | Freq: Every day | ORAL | Status: DC
  Administered 2014-11-11 – 2014-11-12 (×2): 20 mg via ORAL
  Filled 2014-11-11 (×2): qty 1

## 2014-11-11 MED ORDER — FUROSEMIDE 10 MG/ML IJ SOLN
20.0000 mg | Freq: Two times a day (BID) | INTRAMUSCULAR | Status: DC
  Administered 2014-11-11 – 2014-11-12 (×3): 20 mg via INTRAVENOUS
  Filled 2014-11-11 (×3): qty 2

## 2014-11-11 MED ORDER — ACETAMINOPHEN 650 MG RE SUPP: 650.0000 mg | Freq: Four times a day (QID) | RECTAL | Status: DC | PRN

## 2014-11-11 MED ORDER — NA SULFATE-K SULFATE-MG SULF 17.5-3.13-1.6 GM/177ML PO SOLN: 1.0000 | ORAL | Status: DC

## 2014-11-11 NOTE — Patient Instructions (Addendum)
Give [place] a call if you have any questions or concerns. We will see you in two weeks.  Please go to your Colonoscopy appt. Once it has been scheduled.

## 2014-11-11 NOTE — ED Provider Notes (Signed)
[facility] Emergency Department Provider Note  ____________________________________________  Time seen:  11:45 AM  I have reviewed the triage vital signs and the nursing notes.   HISTORY  Chief Complaint Shortness of Breath; Wheezing; and Leg Swelling     HPI [name] is a 64 y.o. male presents to the emergency department with some shortness of breath and bilateral leg swelling. He reports this is been going on for 2-3 days, but then reports that he noticed the swelling in his legs this past Friday, about 5 days ago. He reports he's never had swelling of this sort before. He does have chronic skin changes in both legs, but he also has significant other skin issues and lesions elsewhere on his body. He does have a history of hypertension and diabetes, but is currently out of all his medications and is not taking anything. He was seen in the emergency department approximately a week and a half ago for bleeding from a wound on his buttocks. There was a concern for impetigo in his upper extremity as well and he was supposed to receive Keflex, but the patient reports he is not taking that currently either. He has no primary physician.  Apparently, the patient has gained weight since his last visit.    Past Medical History  Diagnosis Date  . Hypertension   . Diabetes mellitus     Patient take Metformin.    Patient Active Problem List   Diagnosis Date Noted  . Anemia 11/02/2014  . Mass of anus 11/01/2014    Past Surgical History  Procedure Laterality Date  . Rectal exam under anesthesia N/A 11/02/2014    Procedure: RECTAL EXAM UNDER ANESTHESIA;  Surgeon: Marlyce Huge, MD;  Location: [facility] ORS;  Service: General;  Laterality: N/A;  . Rectal biopsy N/A 11/02/2014    Procedure: BIOPSY RECTAL;  Surgeon: Marlyce Huge, MD;  Location: [facility] ORS;  Service: General;  Laterality: N/A;    Current Outpatient Rx  Name  Route  Sig  Dispense  Refill   . HYDROcodone-acetaminophen (NORCO/VICODIN) 5-325 MG per tablet   Oral   Take 1 tablet by mouth every 6 (six) hours as needed for moderate pain. Patient not taking: Reported on 11/11/2014   30 tablet   0   . hydrocortisone cream 1 %   Topical   Apply topically 2 (two) times daily. Patient not taking: Reported on 11/11/2014   30 g   3   . lisinopril (PRINIVIL,ZESTRIL) 20 MG tablet   Oral   Take 1 tablet (20 mg total) by mouth daily. Patient not taking: Reported on 11/11/2014   30 tablet   0   . metFORMIN (GLUCOPHAGE) 500 MG tablet   Oral   Take 1 tablet (500 mg total) by mouth 2 (two) times daily. Patient not taking: Reported on 11/11/2014   60 tablet   0   . Na Sulfate-K Sulfate-Mg Sulf (SUPREP BOWEL PREP) SOLN   Oral   Take 1 kit by mouth as directed. Please start your first bottle at 5:00PM and your second bottle four hours prior to your procedure.   1 Bottle   0     Allergies Review of patient's allergies indicates no known allergies.  No family history on file.  Social History History  Substance Use Topics  . Smoking status: Current Every Day Smoker -- 1.00 packs/day for 20 years    Types: Cigarettes  . Smokeless tobacco: Never Used  . Alcohol Use: Yes  Review of Systems  Constitutional: Negative for fever. ENT: Negative for sore throat. Cardiovascular: Negative for chest pain. Respiratory: Notable for shortness of breath. Gastrointestinal: Negative for abdominal pain, vomiting and diarrhea. Genitourinary: Negative for dysuria. Musculoskeletal: No myalgias or injuries. Positive for edema in both legs. Skin: Skin cancers noted in his medical record. He has extensive skin lesions. Neurological: Negative for headaches   10-point ROS otherwise negative.  ____________________________________________   PHYSICAL EXAM:  VITAL SIGNS: ED Triage Vitals  Enc Vitals Group     BP 11/11/14 1102 138/78 mmHg     Pulse Rate 11/11/14 1102 93     Resp 11/11/14  1102 20     Temp 11/11/14 1102 98.4 F (36.9 C)     Temp Source 11/11/14 1102 Oral     SpO2 11/11/14 1102 98 %     Weight 11/11/14 1102 190 lb (86.183 kg)     Height 11/11/14 1102 '5\' 9"'  (1.753 m)     Head Cir --      Peak Flow --      Pain Score 11/11/14 1104 3     Pain Loc --      Pain Edu? --      Excl. in [place]? --     Constitutional:  Alert and oriented. Well appearing and in no distress. ENT   Head: Normocephalic and atraumatic.   Nose: No congestion/rhinnorhea.   Mouth/Throat: Mucous membranes are moist. Cardiovascular: Normal rate, regular rhythm, no murmur noted Respiratory:  Normal respiratory effort, no tachypnea.    No wheezing, but patient does have mildly coarse breath sounds at the bases..  Gastrointestinal: Soft and nontender. No distention.  Back: No muscle spasm, no tenderness, no CVA tenderness. Musculoskeletal: No deformity noted. Nontender with normal range of motion in all extremities. Patient has  2+ edema in both legs. Neurologic:  Normal speech and language. No gross focal neurologic deficits are appreciated.  Skin: Extensive breaks in skin in various locations. This is in notable in both lower extremities but also present on his arms. There is no erythema or discharge. Psychiatric: Mood and affect are normal overall. The patient is pleasant, nearly gregarious, and is forthright about his noncompliance regarding his medications.  ____________________________________________    LABS (pertinent positives/negatives)  Labs Reviewed  CBC WITH DIFFERENTIAL/PLATELET - Abnormal; Notable for the following:    RBC 2.57 (*)    Hemoglobin 6.7 (*)    HCT 21.0 (*)    MCH 25.9 (*)    MCHC 31.8 (*)    RDW 16.8 (*)    Monocytes Absolute 1.1 (*)    All other components within normal limits  COMPREHENSIVE METABOLIC PANEL - Abnormal; Notable for the following:    Sodium 130 (*)    Chloride 98 (*)    Glucose, Bld 211 (*)    Calcium 7.6 (*)    Albumin 2.2 (*)     AST 45 (*)    All other components within normal limits  BRAIN NATRIURETIC PEPTIDE - Abnormal; Notable for the following:    B Natriuretic Peptide 531.0 (*)    All other components within normal limits  TROPONIN I  TYPE AND SCREEN  PREPARE RBC (CROSSMATCH)     ____________________________________________   EKG  ED ECG REPORT I, Tyeson Tanimoto W, the attending physician, personally viewed and interpreted this ECG.   Date: 11/11/2014  EKG Time: 11:37 AM  Rate: 92  Rhythm: Normal sinus rhythm  Axis: Normal  Intervals: Normal  ST&T Change: None noted  ____________________________________________    RADIOLOGY  Chest x-ray: IMPRESSION: Evidence of a degree of congestive heart failure. No airspace consolidation or adenopathy. This x-ray was personally reviewed by me, Francene Castle, M.D.  ____________________________________________   PROCEDURES  CRITICAL CARE Performed by: Ahmed Prima   Total critical care time: 35 minutes due to critical care of the patient's new onset congestive heart failure, anemia, discussion and ordering of transfusion  Critical care time was exclusive of separately billable procedures and treating other patients.  Critical care was necessary to treat or prevent imminent or life-threatening deterioration.  Critical care was time spent personally by me on the following activities: development of treatment plan with patient and/or surrogate as well as nursing, discussions with consultants, evaluation of patient's response to treatment, examination of patient, obtaining history from patient or surrogate, ordering and performing treatments and interventions, ordering and review of laboratory studies, ordering and review of radiographic studies, pulse oximetry and re-evaluation of patient's condition.  ____________________________________________   INITIAL IMPRESSION / ASSESSMENT AND PLAN / ED COURSE  Pertinent labs & imaging results  that were available during my care of the patient were reviewed by me and considered in my medical decision making (see chart for details).  Patient with signs and symptoms of mild congestive heart failure. This would be a new diagnosis per the patient. He has notable peripheral edema, weight gain, and shortness of breath.   ----------------------------------------- 2:00 PM on 11/11/2014 -----------------------------------------  Patient's hemoglobin is notably low at 6.7. It is unclear where the loss is from. We will type and cross and plan a transfusion of 1 unit currently. I will treat him with Lasix now for both congestive heart failure and the upcoming transfusion. We will seek admission to the hospital.   ____________________________________________   FINAL CLINICAL IMPRESSION(S) / ED DIAGNOSES  Final diagnoses:  Shortness of breath  Acute congestive heart failure, unspecified congestive heart failure type  Anemia requiring transfusions  Skin disorder      Ahmed Prima, MD 11/11/14 1550

## 2014-11-11 NOTE — ED Notes (Signed)
Pt here with c/o sob for the past 2 days, BLE edema, states hes had a 30lb weight gain in 9 days.

## 2014-11-11 NOTE — Telephone Encounter (Signed)
Please let pt know I have scheduled him for Friday, August 12th at [facility]. Let him know he will receive a call from [facility] to go over medications, get him preregistered etc... Then a call the day before his procedure after 2:00pm for his exact arrival time. Thanks!!

## 2014-11-11 NOTE — Telephone Encounter (Signed)
Ginger, can you please schedule a colonoscopy at the [facility] for me. Patient has had hematochezia. I gave him his instructions, so I told him that I would be calling him with the procedure date. I also sent his Rx for Suprep. Patient is a diabetic (taking Metformin) and hypertensive( taking Lisinopril). Thank you so much!  Please let me know when you schedule it so I could call him.

## 2014-11-11 NOTE — Progress Notes (Signed)
Surgery Progress Note  [name] returns for discussion about his perianal mass.  Although I do believe that this is likely a skin cancer which needs resected with adequate margins and I do feel that my exam did not indicate that this is an anal cancer, I would prefer him to get a colonoscopy prior to any resection.  He does present with auditory wheezing and has had a 30 lb weight gain over the past week.  Lungs sound good and sats are good but I will send him to ER to have him evaluated for possible CHF/fluid overload.  Will arrange for colonoscopy and to follow up in 2 weeks when his condition has improved.  Blood pressure 151/74, pulse 109, temperature 98.7 F (37.1 C), temperature source Oral, height 5\' 9"  (1.753 m), weight 84.823 kg (187 lb). GEN: NAD, appears short of breath RESP: appears uncomfortable, auditory wheezings from across the room. BUTTOCKS: Deferred due to patient breathing issues LEGS: + 2 pitting LE edema, may be due to vascular insufficency  A/P 64 yo s/p biopsy of perianal mass.  Currently with what appears to be volume overload.  Will send to ER for evaluation/treatment.

## 2014-11-11 NOTE — ED Notes (Signed)
Patient has multiple open areas on bilateral arms, legs and upper back that are from scratching per the patient.  He states when his sugar is high he starts itching and scratching. Scant amount of bloody drainage noted.

## 2014-11-11 NOTE — Progress Notes (Signed)
Patient assessed in clinic by Dr. Rexene Edison and determined that he would need to be seen in the emergency department.  Taken to ED secondary to SOB, 30 pound weight gain in 9 days, Tachycardia- 109, elevated BP, +3 pitting bilateral lower extremity edema, audible wheezing, and diminished BLL and rhonchi throughout upon listening to lungs. Pt denies having a PCP.  Pt taken via wheelchair. Report given to triage nurse, Collyn at time of arrival. Pt was left in stable condition with medical personnel present.

## 2014-11-11 NOTE — Progress Notes (Signed)
Pt c/o itchiness, MD notified. 25 mg Bendaryl  q 6 PRN ordered.

## 2014-11-11 NOTE — ED Notes (Signed)
Wheezing upon initial assessment.

## 2014-11-11 NOTE — H&P (Signed)
[name] at [place] NAME: [name]    MR#:  [id number]  DATE OF BIRTH:  1950/11/16  DATE OF ADMISSION:  11/11/2014  PRIMARY CARE PHYSICIAN: No PCP Per Patient   REQUESTING/REFERRING PHYSICIAN: Dr. Francene Castle  CHIEF COMPLAINT:   Chief Complaint  Patient presents with  . Shortness of Breath  . Wheezing  . Leg Swelling    HISTORY OF PRESENT ILLNESS:  [name]  is a 64 y.o. 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 Patient says that he's had worsening lower some edema now for the past week or so. He denies any shortness of breath, paroxysmal nocturnal dyspnea, orthopnea. He denies any chest pain. He says he has not had any rectal bleeding since she was discharged last week from the surgical service. Patient presented with a hemoglobin of 6.7 and therefore hospitalist services were contacted for further treatment and evaluation.  PAST MEDICAL HISTORY:   Past Medical History  Diagnosis Date  . Hypertension   . Diabetes mellitus     Patient take Metformin.    PAST SURGICAL HISTORY:   Past Surgical History  Procedure Laterality Date  . Rectal exam under anesthesia N/A 11/02/2014    Procedure: RECTAL EXAM UNDER ANESTHESIA;  Surgeon: Marlyce Huge, MD;  Location: [facility] ORS;  Service: General;  Laterality: N/A;  . Rectal biopsy N/A 11/02/2014    Procedure: BIOPSY RECTAL;  Surgeon: Marlyce Huge, MD;  Location: [facility] ORS;  Service: General;  Laterality: N/A;    SOCIAL HISTORY:   History  Substance Use Topics  . Smoking status: Current Every Day Smoker -- 1.00 packs/day for 20 years    Types: Cigarettes  . Smokeless tobacco: Never Used  . Alcohol Use: Yes    FAMILY HISTORY:   Family History  Problem Relation  Age of Onset  . Asthma Mother     DRUG ALLERGIES:  No Known Allergies  REVIEW OF SYSTEMS:   Review of Systems  Constitutional: Negative for fever and weight loss.  HENT: Negative for congestion, nosebleeds and tinnitus.   Eyes: Negative for blurred vision, double vision and redness.  Respiratory: Negative for cough, hemoptysis and shortness of breath.   Cardiovascular: Positive for leg swelling. Negative for chest pain, orthopnea and PND.  Gastrointestinal: Negative for nausea, vomiting, abdominal pain, diarrhea and melena.  Genitourinary: Negative for dysuria, urgency and hematuria.  Musculoskeletal: Negative for joint pain and falls.  Neurological: Positive for weakness. Negative for dizziness, tingling, sensory change, focal weakness, seizures and headaches.  Endo/Heme/Allergies: Negative for polydipsia. Does not bruise/bleed easily.  Psychiatric/Behavioral: Negative for depression and memory loss. The patient is not nervous/anxious.     MEDICATIONS AT HOME:   Prior to Admission medications   Medication Sig Start Date End Date Taking? Authorizing Provider  aspirin EC 81 MG tablet Take 81 mg by mouth daily.   Yes Historical Provider, MD  diphenhydrAMINE (BENADRYL) 25 MG tablet Take 75 mg by mouth every 6 (six) hours as needed for itching.   Yes Historical Provider, MD  hydrocortisone cream 1 % Apply topically 2 (two) times daily. Patient taking differently: Apply 1 application topically 2 (two) times daily as needed for itching.  11/04/14  Yes Marlyce Huge, MD  HYDROcodone-acetaminophen (NORCO/VICODIN) 5-325 MG per tablet Take 1 tablet by  mouth every 6 (six) hours as needed for moderate pain. Patient not taking: Reported on 11/11/2014 11/04/14   Marlyce Huge, MD  lisinopril (PRINIVIL,ZESTRIL) 20 MG tablet Take 1 tablet (20 mg total) by mouth daily. Patient not taking: Reported on 11/11/2014 11/03/14   Bettey Costa, MD  metFORMIN (GLUCOPHAGE) 500 MG tablet Take 1 tablet  (500 mg total) by mouth 2 (two) times daily. Patient not taking: Reported on 11/11/2014 11/03/14   Bettey Costa, MD  Na Sulfate-K Sulfate-Mg Sulf (SUPREP BOWEL PREP) SOLN Take 1 kit by mouth as directed. Please start your first bottle at 5:00PM and your second bottle four hours prior to your procedure. Patient not taking: Reported on 11/11/2014 11/11/14   Marlyce Huge, MD      VITAL SIGNS:  Blood pressure 157/100, pulse 95, temperature 98.4 F (36.9 C), temperature source Oral, resp. rate 24, height '5\' 9"'  (1.753 m), weight 86.183 kg (190 lb), SpO2 97 %.  PHYSICAL EXAMINATION:  Physical Exam  GENERAL:  64 y.o.-year-old patient lying in the bed with no acute distress.  EYES: Pupils equal, round, reactive to light and accommodation. No scleral icterus. Extraocular muscles intact.  HEENT: Head atraumatic, normocephalic. Oropharynx and nasopharynx clear. No oropharyngeal erythema, moist oral mucosa  NECK:  Supple, no jugular venous distention. No thyroid enlargement, no tenderness.  LUNGS: Normal breath sounds bilaterally, no wheezing, rales, rhonchi. No use of accessory muscles of respiration.  CARDIOVASCULAR: S1, S2 RRR. No murmurs, rubs, gallops, clicks.  ABDOMEN: Soft, nontender, nondistended. Bowel sounds present. No organomegaly or mass.  EXTREMITIES: +2 edema b/l, No cyanosis, or clubbing. + 2 pedal & radial pulses b/l.   NEUROLOGIC: Cranial nerves II through XII are intact. No focal Motor or sensory deficits appreciated b/l PSYCHIATRIC: The patient is alert and oriented x 3. Good affect.  SKIN: patches of scratches and excoriating rash on LE and upper ext b/l,  No lesions, or ulcers.   LABORATORY PANEL:   CBC  Recent Labs Lab 11/11/14 1156  WBC 8.5  HGB 6.7*  HCT 21.0*  PLT 277   ------------------------------------------------------------------------------------------------------------------  Chemistries   Recent Labs Lab 11/11/14 1156  NA 130*  K 4.0  CL 98*  CO2  26  GLUCOSE 211*  BUN 11  CREATININE 0.82  CALCIUM 7.6*  AST 45*  ALT 22  ALKPHOS 68  BILITOT 0.5   ------------------------------------------------------------------------------------------------------------------  Cardiac Enzymes  Recent Labs Lab 11/11/14 1156  TROPONINI <0.03   ------------------------------------------------------------------------------------------------------------------  RADIOLOGY:  Dg Chest 2 View  11/11/2014   CLINICAL DATA:  Two day history of shortness of breath and bilateral lower extremity edema. 30 lb weight gain in 9 days  EXAM: CHEST  2 VIEW  COMPARISON:  Chest radiograph September 08, 2013; chest CT November 01, 2014  FINDINGS: There are bilateral pleural effusions with bibasilar atelectasis. There is patchy interstitial edema, primarily in a perihilar distribution. Heart is upper normal in size with slight pulmonary venous hypertension. No airspace consolidation. No adenopathy. No bone lesions.  IMPRESSION: Evidence of a degree of congestive heart failure. No airspace consolidation or adenopathy.   Electronically Signed   By: Lowella Grip III M.D.   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  #1 Lower extremity edema-the exact etiology of this is unclear. Questionable if this is  dependent edema versus underlying CHF. -I will get Dopplers of the lower extremities to rule out DVT. I will diurese the patient with IV Lasix and follow. I will get a two-dimensional echocardiogram. And follow him clinically.  #2 anemia-this is acute on chronic anemia. Hemoglobin on discharge about 10 days ago was around 7.2  and this today it's down to 6.7. -We'll transfuse the patient 1 unit packed red blood cells and follow hemoglobin.  #3 diabetes type  2-continue metformin, sliding scale insulin. Check a hemoglobin A1c follow blood sugars.  #4 hypertension-continue lisinopril.  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    All the records are reviewed and case discussed with ED provider. Management plans discussed with the patient, family and they are in agreement.  CODE STATUS: Full  TOTAL TIME TAKING CARE OF THIS PATIENT: 50 minutes.    Henreitta Leber M.D on 11/11/2014 at 3:26 PM  Between 7am to 6pm - Pager - [phone]  After 6pm go to [email or web address] - password [email or web address] [facility]  Office  [phone]  CC: Primary care physician; No PCP Per Patient

## 2014-11-12 ENCOUNTER — Telehealth: Payer: Self-pay

## 2014-11-12 ENCOUNTER — Inpatient Hospital Stay: Payer: Medicaid Other

## 2014-11-12 ENCOUNTER — Inpatient Hospital Stay
Admit: 2014-11-12 | Discharge: 2014-11-12 | Disposition: A | Discharge status: Home / Self Care | Payer: Medicaid Other | Attending: Internal Medicine | Admitting: Internal Medicine

## 2014-11-12 LAB — BASIC METABOLIC PANEL
Anion gap: 6 (ref 5–15)
BUN: 11 mg/dL (ref 6–20)
CO2: 28 mmol/L (ref 22–32)
CREATININE: 0.83 mg/dL (ref 0.61–1.24)
Calcium: 7.9 mg/dL — ABNORMAL LOW (ref 8.9–10.3)
Chloride: 100 mmol/L — ABNORMAL LOW (ref 101–111)
GFR calc Af Amer: >60 mL/min (ref >60)
GFR calc non Af Amer: >60 mL/min (ref >60)
GLUCOSE: 178 mg/dL — AB (ref 65–99)
POTASSIUM: 3.6 mmol/L (ref 3.5–5.1)
Sodium: 134 mmol/L — ABNORMAL LOW (ref 135–145)

## 2014-11-12 LAB — CBC
HCT: 23 % — ABNORMAL LOW (ref 40.0–52.0)
HEMOGLOBIN: 7.5 g/dL — AB (ref 13.0–18.0)
MCH: 26.3 pg (ref 26.0–34.0)
MCHC: 32.5 g/dL (ref 32.0–36.0)
MCV: 80.8 fL (ref 80.0–100.0)
PLATELETS: 280 10*3/uL (ref 150–440)
RBC: 2.84 MIL/uL — ABNORMAL LOW (ref 4.40–5.90)
RDW: 16.7 % — AB (ref 11.5–14.5)
WBC: 7.5 10*3/uL (ref 3.8–10.6)

## 2014-11-12 LAB — GLUCOSE, CAPILLARY
GLUCOSE-CAPILLARY: 208 mg/dL — AB (ref 65–99)
GLUCOSE-CAPILLARY: 217 mg/dL — AB (ref 65–99)
Glucose-Capillary: 169 mg/dL — ABNORMAL HIGH (ref 65–99)

## 2014-11-12 MED ORDER — FUROSEMIDE 40 MG PO TABS: 40.0000 mg | ORAL_TABLET | Freq: Two times a day (BID) | ORAL | Status: DC

## 2014-11-12 MED ORDER — FERROUS SULFATE 325 (65 FE) MG PO TABS: 325.0000 mg | ORAL_TABLET | Freq: Every day | ORAL | Status: DC

## 2014-11-12 NOTE — Progress Notes (Signed)
Pt is being discharged home with nephew. IV removed and gauze applied. Pt verbalize understanding of discharge instruction and was able to do teach back. No signs of distress noted at discharge. Pt is currently dressed and waiting on nephew in the room.

## 2014-11-12 NOTE — Telephone Encounter (Signed)
Thank you. I will call him and send him a letter.  Patient was notified.

## 2014-11-12 NOTE — Care Management (Signed)
Admitted to this facility with the diagnosis of anemia. Discharged from this 11/04/14.  Lives alone, but nephew is in the home sometimes.  No home health in the past. No skilled facility. No home oxygen. States he uses no aids for ambulation and still drives. States he hasn't followed up with  a physician since his last discharge from this facility. Dr. Ripley Fraise progress notes indicate that Mr. Tumlinson has been seen in Dr. Antionette Char office.  States a facility member will transport. Shelbie Ammons RN MSN Care Management [phone]

## 2014-11-12 NOTE — Progress Notes (Signed)
*  PRELIMINARY RESULTS* Echocardiogram 2D Echocardiogram has been performed.  [name] 11/12/2014, 10:18 AM

## 2014-11-12 NOTE — Progress Notes (Signed)
I have seen and evaluated patient.  [name] is a pleasant 64 yo who I know well from his recent admission. Presented with significant blood loss from pedunculated, emacerated buttock mass.  Also had a flat lesion on his opposite buttock which was suspicious for neoplasm.  He went to the OR for EUA and biopsy of masses.  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  He had presented to my office yesterday with difficulty breathing and significant weight gain.  He was admitted for likely CHF exacerbation and anemia.  I feel that anemia was due to prior blood loss and likely worsened due to patient chronic debilitation and volume overload.   On examination, he does have wound breakdown at his prior excision site.  Wound with good granulation.  Sessile lesion without issues.  No bleeding, no acute wound issues.  Plan is for outpatient management of this lesion and adjacent wound.  No acute surgical issues.  Will sign off.  Call with questions.

## 2014-11-12 NOTE — Progress Notes (Signed)
*  PRELIMINARY RESULTS* Echocardiogram 2D Echocardiogram has been performed.  Laqueta Jean Hege 11/12/2014, 10:19 AM

## 2014-11-12 NOTE — Progress Notes (Signed)
[facility] at [place] NAME: [name]    MR#:  [id number]  DATE OF BIRTH:  Sep 11, 1950  SUBJECTIVE:  Patient doing well this morning. Patient reports that his lower extremity edema is much improved. Patient denies any shortness of breath.  REVIEW OF SYSTEMS:    Review of Systems  Constitutional: Negative for fever, chills and malaise/fatigue.  HENT: Negative for sore throat.   Eyes: Negative for blurred vision.  Respiratory: Negative for cough, hemoptysis, shortness of breath and wheezing.   Cardiovascular: Positive for leg swelling. Negative for chest pain and palpitations.  Gastrointestinal: Negative for nausea, vomiting, abdominal pain, diarrhea and blood in stool.  Genitourinary: Negative for dysuria.  Musculoskeletal: Negative for back pain.  Neurological: Negative for dizziness, tremors and headaches.  Endo/Heme/Allergies: Does not bruise/bleed easily.    Tolerating Diet: Yes      DRUG ALLERGIES:  No Known Allergies  VITALS:  Blood pressure 128/75, pulse 88, temperature 99.4 F (37.4 C), temperature source Oral, resp. rate 18, height 5\' 9"  (1.753 m), weight 79.425 kg (175 lb 1.6 oz), SpO2 98 %.  PHYSICAL EXAMINATION:   Physical Exam  Constitutional: He is oriented to person, place, and time and well-developed, well-nourished, and in no distress. No distress.  HENT:  Head: Normocephalic.  Eyes: No scleral icterus.  Neck: Normal range of motion. Neck supple. No JVD present. No tracheal deviation present.  Cardiovascular: Normal rate, regular rhythm and normal heart sounds.  Exam reveals no gallop and no friction rub.   No murmur heard. Pulmonary/Chest: Effort normal and breath sounds normal. No respiratory distress. He has no wheezes. He has no rales. He exhibits no tenderness.  Abdominal: Soft. Bowel sounds are normal. He exhibits no distension and no mass. There is no tenderness. There is no rebound and no  guarding.  Musculoskeletal: Normal range of motion. He exhibits edema.  Neurological: He is alert and oriented to person, place, and time.  Skin: Skin is warm. No rash noted. No erythema.  Psychiatric: Affect and judgment normal.      LABORATORY PANEL:   CBC  Recent Labs Lab 11/12/14 0351  WBC 7.5  HGB 7.5*  HCT 23.0*  PLT 280   ------------------------------------------------------------------------------------------------------------------  Chemistries   Recent Labs Lab 11/11/14 1156 11/12/14 0351  NA 130* 134*  K 4.0 3.6  CL 98* 100*  CO2 26 28  GLUCOSE 211* 178*  BUN 11 11  CREATININE 0.82 0.83  CALCIUM 7.6* 7.9*  AST 45*  --   ALT 22  --   ALKPHOS 68  --   BILITOT 0.5  --    ------------------------------------------------------------------------------------------------------------------  Cardiac Enzymes  Recent Labs Lab 11/11/14 1156  TROPONINI <0.03   ------------------------------------------------------------------------------------------------------------------  RADIOLOGY:  Dg Chest 2 View  11/11/2014   CLINICAL DATA:  Two day history of shortness of breath and bilateral lower extremity edema. 30 lb weight gain in 9 days  EXAM: CHEST  2 VIEW  COMPARISON:  Chest radiograph September 08, 2013; chest CT November 01, 2014  FINDINGS: There are bilateral pleural effusions with bibasilar atelectasis. There is patchy interstitial edema, primarily in a perihilar distribution. Heart is upper normal in size with slight pulmonary venous hypertension. No airspace consolidation. No adenopathy. No bone lesions.  IMPRESSION: Evidence of a degree of congestive heart failure. No airspace consolidation or adenopathy.   Electronically Signed   By: Lowella Grip III M.D.   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  1. Lower extremity edema: This may be due to underlying CHF. Echocardiogram is pending. Lotion edema has improved. Dopplers are pending to rule out a DVT.  2. Acute blood loss anemia on chronic: Patient's discharge hemoglobin was 7.2. He presented with hemoglobin of 6.7. He is status post 1 unit of PRBCs. Patient will need further workup as an outpatient. Patient denies hematochezia, melanoma or hematemesis.  3. Type 2 diabetes: Patient continue metformin and signs: Son.  4. Essential hypertension: Continue lisinopril   5. Perianal mass: Patient will continue with surgery recommendations.    Management plans discussed with the patient and he is in agreement.  CODE STATUS: Full  TOTAL TIME TAKING CARE OF THIS PATIENT: 30  minutes.     POSSIBLE D/C today, DEPENDING ON CLINICAL CONDITION and echo results.   Dessa Ledee M.D on 11/12/2014 at 12:01 PM  Between 7am to 6pm - Pager - [phone] After 6pm go to [email or web address] - password [email or web address] [facility]  [facility]  Office  [phone]  CC: Primary care physician; No PCP Per Patient

## 2014-11-12 NOTE — Plan of Care (Signed)
Problem: Discharge Progression Outcomes Goal: Other Discharge Outcomes/Goals Outcome: Progressing Patient is alert and oriented, c/o generalized itchiness. Benadryl given with relief. Patient has generalized abrasions and open areas due to itching. Continue with Lasix q12 hours.

## 2014-11-12 NOTE — Discharge Summary (Signed)
[facility] at [place] NAME: [name]    MR#:  [id number]  DATE OF BIRTH:  May 22, 1950  DATE OF ADMISSION:  11/11/2014 ADMITTING PHYSICIAN: Henreitta Leber, MD  DATE OF DISCHARGE: 11/12/2014 PRIMARY CARE PHYSICIAN: No PCP Per Patient    ADMISSION DIAGNOSIS:  Shortness of breath [R06.02] Skin disorder [L98.9] Anemia requiring transfusions [D64.9] Acute congestive heart failure, unspecified congestive heart failure type [I50.9]  DISCHARGE DIAGNOSIS:  Active Problems:   Anemia   SECONDARY DIAGNOSIS:   Past Medical History  Diagnosis Date  . Hypertension   . Diabetes mellitus     Patient take Metformin.  . 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  1. Lower extremity edema: 2-D echocardiogram showed normal ejection fraction without any valvular abnormalities. Lower external he Dopplers were also negative for DVT. Lower extremity edema did improve with Lasix. He will be on Lasix for 5 more days. I encouraged the patient to use the compression stockings.   2. Acute blood loss anemia on chronic: Patient's discharge hemoglobin was 7.2. He presented with hemoglobin of 6.7. He is status post 1 unit of PRBCs. Patient will need further workup as an outpatient. Patient denies hematochezia, melanoma or hematemesis.  3. Type 2 diabetes: Patient continue metformin and signs: Son.  4. Essential hypertension: Continue lisinopril   5. Perianal mass: Patient will continue with surgery recommendations.   DISCHARGE CONDITIONS AND DIET:  Heart healthy  Discharge in stable condition  CONSULTS OBTAINED:    Surgery DRUG ALLERGIES:  No Known Allergies  DISCHARGE MEDICATIONS:   Current Discharge Medication List    CONTINUE these medications which have NOT  CHANGED   Details  aspirin EC 81 MG tablet Take 81 mg by mouth daily.    diphenhydrAMINE (BENADRYL) 25 MG tablet Take 75 mg by mouth every 6 (six) hours as needed for itching.    hydrocortisone cream 1 % Apply topically 2 (two) times daily. Qty: 30 g, Refills: 3    HYDROcodone-acetaminophen (NORCO/VICODIN) 5-325 MG per tablet Take 1 tablet by mouth every 6 (six) hours as needed for moderate pain. Qty: 30 tablet, Refills: 0    lisinopril (PRINIVIL,ZESTRIL) 20 MG tablet Take 1 tablet (20 mg total) by mouth daily. Qty: 30 tablet, Refills: 0    metFORMIN (GLUCOPHAGE) 500 MG tablet Take 1 tablet (500 mg total) by mouth 2 (two) times daily. Qty: 60 tablet, Refills: 0    Na Sulfate-K Sulfate-Mg Sulf (SUPREP BOWEL PREP) SOLN Take 1 kit by mouth as directed. Please start your first bottle at 5:00PM and your second bottle four hours prior to your procedure. Qty: 1 Bottle, Refills: 0   Associated Diagnoses: Rectal bleeding        Lasix 20 mg daily Ferrous sulfate 325 twice a day      Today   CHIEF COMPLAINT:  No acute issues feels his lower extremity edema is improved   VITAL SIGNS:  Blood pressure 128/75, pulse 88, temperature 99.4 F (37.4 C), temperature source Oral, resp. rate 18, height '5\' 9"'  (1.753 m), weight 79.425 kg (175 lb 1.6 oz), SpO2 98 %.   REVIEW OF SYSTEMS:  Review of Systems  Constitutional: Negative for fever, chills and malaise/fatigue.  HENT: Negative for sore throat.   Eyes: Negative for blurred vision.  Respiratory: Negative  for cough, hemoptysis, shortness of breath and wheezing.   Cardiovascular: Positive for leg swelling (improved). Negative for chest pain and palpitations.  Gastrointestinal: Negative for nausea, vomiting, abdominal pain, diarrhea and blood in stool.  Genitourinary: Negative for dysuria.  Musculoskeletal: Negative for back pain.  Neurological: Negative for dizziness, tremors and headaches.  Endo/Heme/Allergies: Does not  bruise/bleed easily.     PHYSICAL EXAMINATION:  GENERAL:  64 y.o.-year-old patient lying in the bed with no acute distress.  NECK:  Supple, no jugular venous distention. No thyroid enlargement, no tenderness.  LUNGS: Normal breath sounds bilaterally, no wheezing, rales,rhonchi  No use of accessory muscles of respiration.  CARDIOVASCULAR: S1, S2 normal. No murmurs, rubs, or gallops.  ABDOMEN: Soft, non-tender, non-distended. Bowel sounds present. No organomegaly or mass.  EXTREMITIES: Positive lower extremity edema without clubbing or cyanosis PSYCHIATRIC: The patient is alert and oriented x 3.  SKIN: No obvious rash, lesion, or ulcer.  Multiple abrasions on his arms DATA REVIEW:   CBC  Recent Labs Lab 11/12/14 0351  WBC 7.5  HGB 7.5*  HCT 23.0*  PLT 280    Chemistries   Recent Labs Lab 11/11/14 1156 11/12/14 0351  NA 130* 134*  K 4.0 3.6  CL 98* 100*  CO2 26 28  GLUCOSE 211* 178*  BUN 11 11  CREATININE 0.82 0.83  CALCIUM 7.6* 7.9*  AST 45*  --   ALT 22  --   ALKPHOS 68  --   BILITOT 0.5  --     Cardiac Enzymes  Recent Labs Lab 11/11/14 1156  TROPONINI <0.03    Microbiology Results  '@[id number]' @  RADIOLOGY:  Dg Chest 2 View  11/11/2014   CLINICAL DATA:  Two day history of shortness of breath and bilateral lower extremity edema. 30 lb weight gain in 9 days  EXAM: CHEST  2 VIEW  COMPARISON:  Chest radiograph September 08, 2013; chest CT November 01, 2014  FINDINGS: There are bilateral pleural effusions with bibasilar atelectasis. There is patchy interstitial edema, primarily in a perihilar distribution. Heart is upper normal in size with slight pulmonary venous hypertension. No airspace consolidation. No adenopathy. No bone lesions.  IMPRESSION: Evidence of a degree of congestive heart failure. No airspace consolidation or adenopathy.   Electronically Signed   By: Lowella Grip III M.D.   On: 11/11/2014 12:29      Management plans discussed with the patient and  he is in agreement. Stable for discharge home  Patient should follow up with surgery in one week  CODE STATUS:     Code Status Orders        Start     Ordered   11/11/14 1730  Full code   Continuous     11/11/14 1729      TOTAL TIME TAKING CARE OF THIS PATIENT: 35 minutes.    Lilliona Blakeney M.D on 11/12/2014 at 12:04 PM  Between 7am to 6pm - Pager - [phone] After 6pm go to [email or web address] - password [email or web address] [facility]  [place] [facility]  Office  [phone]  CC: Primary care physician; No PCP Per Patient

## 2014-11-12 NOTE — Progress Notes (Signed)
   11/12/14 0920  Clinical Encounter Type  Visited With Patient  Visit Type Initial  Referral From Nurse  Consult/Referral To Chaplain  Spiritual Encounters  Spiritual Needs Emotional  Stress Factors  Patient Stress Factors Family relationships  Provided pastoral presence and support to patient.  Patient hoping to be released today.  Michela Pitcher he was doing on.  Patient thanked me for my visit.  [facility] [phone]

## 2014-11-16 ENCOUNTER — Telehealth: Payer: Self-pay | Admitting: Gastroenterology

## 2014-11-16 NOTE — Telephone Encounter (Signed)
Mr. Suppes came by to pick up prep. He said for you to call him Wednesday morning please.

## 2014-11-16 NOTE — Telephone Encounter (Signed)
Called patient to let him know that his appointment for his colonoscopy will be on 11/19/2014. He stated that he went to the [facility] office and spoke with Ginger and was told to come back to the office on Thursday to pick up a Suprep sample. He then confirmed that he was getting his colonoscopy on Friday.  I also gave patient [facility]'s and [facility]'s phone number for him to call and see who was accepting new patient to establish care with a doctor. I told him that he needed to be seen by a primary doctor as soon as possible to get his diabetes and other medical issues checked. Patient understood and stated that he would give them a call.

## 2014-11-18 NOTE — Discharge Instructions (Signed)
General Anesthesia, Care After °Refer to this sheet in the next few weeks. These instructions provide you with information on caring for yourself after your procedure. Your health care provider may also give you more specific instructions. Your treatment has been planned according to current medical practices, but problems sometimes occur. Call your health care provider if you have any problems or questions after your procedure. °WHAT TO EXPECT AFTER THE PROCEDURE °After the procedure, it is typical to experience: °· Sleepiness. °· Nausea and vomiting. °HOME CARE INSTRUCTIONS °· For the first 24 hours after general anesthesia: °¨ Have a responsible person with you. °¨ Do not drive a car. If you are alone, do not take public transportation. °¨ Do not drink alcohol. °¨ Do not take medicine that has not been prescribed by your health care provider. °¨ Do not sign important papers or make important decisions. °¨ You may resume a normal diet and activities as directed by your health care provider. °· Change bandages (dressings) as directed. °· If you have questions or problems that seem related to general anesthesia, call the hospital and ask for the anesthetist or anesthesiologist on call. °SEEK MEDICAL CARE IF: °· You have nausea and vomiting that continue the day after anesthesia. °· You develop a rash. °SEEK IMMEDIATE MEDICAL CARE IF:  °· You have difficulty breathing. °· You have chest pain. °· You have any allergic problems. °Document Released: 07/02/2000 Document Revised: 03/31/2013 Document Reviewed: 10/09/2012 °ExitCare® Patient Information ©2015 [employer]. This information is not intended to replace advice given to you by your health care provider. Make sure you discuss any questions you have with your health care provider. ° °

## 2014-11-19 ENCOUNTER — Encounter
Admission: RE | Disposition: A | Discharge status: Home / Self Care | Payer: Self-pay | Source: Ambulatory Visit | Attending: Gastroenterology

## 2014-11-19 ENCOUNTER — Ambulatory Visit: Payer: Medicaid Other | Admitting: Anesthesiology

## 2014-11-19 ENCOUNTER — Ambulatory Visit
Admission: RE | Admit: 2014-11-19 | Discharge: 2014-11-19 | Disposition: A | Discharge status: Home / Self Care | Payer: Medicaid Other | Source: Ambulatory Visit | Attending: Gastroenterology | Admitting: Gastroenterology

## 2014-11-19 DIAGNOSIS — Z825 Family history of asthma and other chronic lower respiratory diseases: Secondary | ICD-10-CM | POA: Insufficient documentation

## 2014-11-19 DIAGNOSIS — K921 Melena: Secondary | ICD-10-CM | POA: Diagnosis not present

## 2014-11-19 DIAGNOSIS — Z7982 Long term (current) use of aspirin: Secondary | ICD-10-CM | POA: Insufficient documentation

## 2014-11-19 DIAGNOSIS — F17210 Nicotine dependence, cigarettes, uncomplicated: Secondary | ICD-10-CM | POA: Diagnosis not present

## 2014-11-19 DIAGNOSIS — Z79899 Other long term (current) drug therapy: Secondary | ICD-10-CM | POA: Insufficient documentation

## 2014-11-19 DIAGNOSIS — K640 First degree hemorrhoids: Secondary | ICD-10-CM | POA: Diagnosis not present

## 2014-11-19 DIAGNOSIS — E119 Type 2 diabetes mellitus without complications: Secondary | ICD-10-CM | POA: Insufficient documentation

## 2014-11-19 DIAGNOSIS — G709 Myoneural disorder, unspecified: Secondary | ICD-10-CM | POA: Diagnosis not present

## 2014-11-19 DIAGNOSIS — Z9889 Other specified postprocedural states: Secondary | ICD-10-CM | POA: Insufficient documentation

## 2014-11-19 DIAGNOSIS — I10 Essential (primary) hypertension: Secondary | ICD-10-CM | POA: Diagnosis not present

## 2014-11-19 HISTORY — DX: Other specified soft tissue disorders: M79.89

## 2014-11-19 HISTORY — DX: Myoneural disorder, unspecified: G70.9

## 2014-11-19 HISTORY — DX: Other specified disorders of teeth and supporting structures: K08.89

## 2014-11-19 HISTORY — DX: Reserved for inherently not codable concepts without codable children: IMO0001

## 2014-11-19 HISTORY — PX: COLONOSCOPY WITH PROPOFOL: SHX5780

## 2014-11-19 LAB — GLUCOSE, CAPILLARY
GLUCOSE-CAPILLARY: 181 mg/dL — AB (ref 65–99)
GLUCOSE-CAPILLARY: 183 mg/dL — AB (ref 65–99)

## 2014-11-19 SURGERY — COLONOSCOPY WITH PROPOFOL
Anesthesia: Monitor Anesthesia Care | Wound class: Contaminated

## 2014-11-19 MED ORDER — LIDOCAINE HCL (CARDIAC) 20 MG/ML IV SOLN
INTRAVENOUS | Status: DC | PRN
  Administered 2014-11-19: 30 mg via INTRAVENOUS

## 2014-11-19 MED ORDER — PROPOFOL 10 MG/ML IV BOLUS
INTRAVENOUS | Status: DC | PRN
  Administered 2014-11-19 (×4): 20 mg via INTRAVENOUS

## 2014-11-19 MED ORDER — DIPHENHYDRAMINE HCL 50 MG/ML IJ SOLN
INTRAMUSCULAR | Status: DC | PRN
  Administered 2014-11-19: 6.25 mg via INTRAVENOUS

## 2014-11-19 MED ORDER — HEPARIN SODIUM (PORCINE) 5000 UNIT/ML IJ SOLN: 5000.0000 [IU] | Freq: Once | INTRAMUSCULAR | Status: DC

## 2014-11-19 MED ORDER — OXYCODONE HCL 5 MG PO TABS: 5.0000 mg | ORAL_TABLET | Freq: Once | ORAL | Status: DC | PRN

## 2014-11-19 MED ORDER — FLEET ENEMA 7-19 GM/118ML RE ENEM: 2.0000 | ENEMA | Freq: Once | RECTAL | Status: DC

## 2014-11-19 MED ORDER — LACTATED RINGERS IV SOLN
INTRAVENOUS | Status: DC | PRN
  Administered 2014-11-19: 10:00:00 via INTRAVENOUS

## 2014-11-19 MED ORDER — SIMETHICONE 40 MG/0.6ML PO SUSP
ORAL | Status: DC | PRN
  Administered 2014-11-19: 10:00:00

## 2014-11-19 MED ORDER — SODIUM CHLORIDE 0.9 % IV SOLN: INTRAVENOUS | Status: DC

## 2014-11-19 MED ORDER — OXYCODONE HCL 5 MG/5ML PO SOLN: 5.0000 mg | Freq: Once | ORAL | Status: DC | PRN

## 2014-11-19 MED ORDER — FAMOTIDINE 20 MG PO TABS: 20.0000 mg | ORAL_TABLET | Freq: Once | ORAL | Status: DC

## 2014-11-19 SURGICAL SUPPLY — 28 items
CANISTER SUCT 1200ML W/VALVE (MISCELLANEOUS) ×3 IMPLANT
FCP ESCP3.2XJMB 240X2.8X (MISCELLANEOUS)
FORCEPS BIOP RAD 4 LRG CAP 4 (CUTTING FORCEPS) IMPLANT
FORCEPS BIOP RJ4 240 W/NDL (MISCELLANEOUS)
FORCEPS ESCP3.2XJMB 240X2.8X (MISCELLANEOUS) IMPLANT
GOWN CVR UNV OPN BCK APRN NK (MISCELLANEOUS) ×2 IMPLANT
GOWN ISOL THUMB LOOP REG UNIV (MISCELLANEOUS) ×4
HEMOCLIP INSTINCT (CLIP) IMPLANT
INJECTOR VARIJECT VIN23 (MISCELLANEOUS) IMPLANT
KIT CO2 TUBING (TUBING) IMPLANT
KIT DEFENDO VALVE AND CONN (KITS) IMPLANT
KIT ENDO PROCEDURE OLY (KITS) ×3 IMPLANT
LIGATOR MULTIBAND 6SHOOTER MBL (MISCELLANEOUS) IMPLANT
MARKER SPOT ENDO TATTOO 5ML (MISCELLANEOUS) IMPLANT
PAD GROUND ADULT SPLIT (MISCELLANEOUS) IMPLANT
SNARE SHORT THROW 13M SML OVAL (MISCELLANEOUS) IMPLANT
SNARE SHORT THROW 30M LRG OVAL (MISCELLANEOUS) IMPLANT
SPOT EX ENDOSCOPIC TATTOO (MISCELLANEOUS)
SUCTION POLY TRAP 4CHAMBER (MISCELLANEOUS) IMPLANT
TRAP SUCTION POLY (MISCELLANEOUS) IMPLANT
TUBING CONN 6MMX3.1M (TUBING)
TUBING SUCTION CONN 0.25 STRL (TUBING) IMPLANT
UNDERPAD 30X60 958B10 (PK) (MISCELLANEOUS) IMPLANT
VALVE BIOPSY ENDO (VALVE) IMPLANT
VARIJECT INJECTOR VIN23 (MISCELLANEOUS)
WATER AUXILLARY (MISCELLANEOUS) IMPLANT
WATER STERILE IRR 250ML POUR (IV SOLUTION) ×3 IMPLANT
WATER STERILE IRR 500ML POUR (IV SOLUTION) IMPLANT

## 2014-11-19 NOTE — Op Note (Signed)
[facility] Patient Name: [name] Procedure Date: 11/19/2014 9:39 AM MRN: [id number] Account #: [id number] Date of Birth: 1950/08/26 Admit Type: Outpatient Age: 64 Room: [facility] OR ROOM 01 Gender: Male Note Status: Finalized Procedure:         Colonoscopy Indications:       Hematochezia Providers:         Lucilla Lame, MD Medicines:         Propofol per Anesthesia Complications:     No immediate complications. Procedure:         Pre-Anesthesia Assessment:                    - Prior to the procedure, a History and Physical was                     performed, and patient medications and allergies were                     reviewed. The patient's tolerance of previous anesthesia                     was also reviewed. The risks and benefits of the procedure                     and the sedation options and risks were discussed with the                     patient. All questions were answered, and informed consent                     was obtained. Prior Anticoagulants: The patient has taken                     no previous anticoagulant or antiplatelet agents. ASA                     Grade Assessment: II - A patient with mild systemic                     disease. After reviewing the risks and benefits, the                     patient was deemed in satisfactory condition to undergo                     the procedure.                    After obtaining informed consent, the colonoscope was                     passed under direct vision. Throughout the procedure, the                     patient's blood pressure, pulse, and oxygen saturations                     were monitored continuously. The Olympus CF H180AL                     colonoscope (S#: [id number]) was introduced through the anus                     and advanced to the the cecum, identified by appendiceal  orifice and ileocecal valve. The colonoscopy was performed    without difficulty. The patient tolerated the procedure                     well. The quality of the bowel preparation was fair. Findings:      The perianal and digital rectal examinations were normal.      Non-bleeding internal hemorrhoids were found during retroflexion. The       hemorrhoids were Grade I (internal hemorrhoids that do not prolapse). Impression:        - Non-bleeding internal hemorrhoids.                    - No specimens collected. Recommendation:    - High fiber diet. Procedure Code(s): --- Professional ---                    [phone], Colonoscopy, flexible; diagnostic, including                     collection of specimen(s) by brushing or washing, when                     performed (separate procedure) Diagnosis Code(s): --- Professional ---                    K92.1, Melena                    K64.0, First degree hemorrhoids CPT copyright 2014 American Medical Association. All rights reserved. The codes documented in this report are preliminary and upon coder review may  be revised to meet current compliance requirements. Lucilla Lame, MD 11/19/2014 9:54:20 AM This report has been signed electronically. Number of Addenda: 0 Note Initiated On: 11/19/2014 9:39 AM Scope Withdrawal Time: 0 hours 5 minutes 41 seconds  Total Procedure Duration: 0 hours 8 minutes 46 seconds       [facility]

## 2014-11-19 NOTE — Transfer of Care (Signed)
Immediate Anesthesia Transfer of Care Note  Patient: [name]  Procedure(s) Performed: Procedure(s) with comments: COLONOSCOPY WITH PROPOFOL (N/A) - DIABETIC-ORAL MEDS  Patient Location: PACU  Anesthesia Type: MAC  Level of Consciousness: awake, alert  and patient cooperative  Airway and Oxygen Therapy: Patient Spontanous Breathing and Patient connected to supplemental oxygen  Post-op Assessment: Post-op Vital signs reviewed, Patient's Cardiovascular Status Stable, Respiratory Function Stable, Patent Airway and No signs of Nausea or vomiting  Post-op Vital Signs: Reviewed and stable  Complications: No apparent anesthesia complications

## 2014-11-19 NOTE — H&P (Signed)
[facility]  [address], [place], [place] [address] Phone: [phone] Fax : [phone]  Primary Care Physician:  No PCP Per Patient Primary Gastroenterologist:  Dr. Allen Norris  Pre-Procedure History & Physical: HPI:  [name] is a 64 y.o. male is here for an colonoscopy.   Past Medical History  Diagnosis Date  . Hypertension   . CHF (congestive heart failure)   . Shortness of breath dyspnea   . Diabetes mellitus     Patient take Metformin.  . Leg swelling   . Neuromuscular disorder     RIGHT HAND AND FINGERS NUMB  . Loose, teeth     PATIENT STATES "HAS 6 TEETH, SOME ARE LOOSE"    Past Surgical History  Procedure Laterality Date  . Rectal exam under anesthesia N/A 11/02/2014    Procedure: RECTAL EXAM UNDER ANESTHESIA;  Surgeon: Marlyce Huge, MD;  Location: [facility] ORS;  Service: General;  Laterality: N/A;  . Rectal biopsy N/A 11/02/2014    Procedure: BIOPSY RECTAL;  Surgeon: Marlyce Huge, MD;  Location: [facility] ORS;  Service: General;  Laterality: N/A;    Prior to Admission medications   Medication Sig Start Date End Date Taking? Authorizing Provider  aspirin EC 81 MG tablet Take 81 mg by mouth daily.   Yes Historical Provider, MD  furosemide (LASIX) 40 MG tablet Take 1 tablet (40 mg total) by mouth 2 (two) times daily. 11/12/14  Yes Bettey Costa, MD  Na Sulfate-K Sulfate-Mg Sulf (SUPREP BOWEL PREP) SOLN Take 1 kit by mouth as directed. Please start your first bottle at 5:00PM and your second bottle four hours prior to your procedure. 11/11/14  Yes Marlyce Huge, MD  diphenhydrAMINE (BENADRYL) 25 MG tablet Take 75 mg by mouth every 6 (six) hours as needed for itching.    Historical Provider, MD  ferrous sulfate (FERROUSUL) 325 (65 FE) MG tablet Take 1 tablet (325 mg total) by mouth daily with breakfast. 11/12/14   Bettey Costa, MD  HYDROcodone-acetaminophen (NORCO/VICODIN) 5-325 MG per tablet Take 1 tablet by mouth every 6 (six) hours as  needed for moderate pain. Patient not taking: Reported on 11/11/2014 11/04/14   Marlyce Huge, MD  hydrocortisone cream 1 % Apply topically 2 (two) times daily. Patient taking differently: Apply 1 application topically 2 (two) times daily as needed for itching.  11/04/14   Marlyce Huge, MD  lisinopril (PRINIVIL,ZESTRIL) 20 MG tablet Take 1 tablet (20 mg total) by mouth daily. Patient not taking: Reported on 11/11/2014 11/03/14   Bettey Costa, MD  metFORMIN (GLUCOPHAGE) 500 MG tablet Take 1 tablet (500 mg total) by mouth 2 (two) times daily. Patient not taking: Reported on 11/11/2014 11/03/14   Bettey Costa, MD    Allergies as of 11/11/2014  . (No Known Allergies)    Family History  Problem Relation Age of Onset  . Asthma Mother     Social History   Social History  . Marital Status: Divorced    Spouse Name: N/A  . Number of Children: N/A  . Years of Education: N/A   Occupational History  . Not on file.   Social History Main Topics  . Smoking status: Current Every Day Smoker -- 0.25 packs/day for 20 years    Types: Cigarettes  . Smokeless tobacco: Never Used  . Alcohol Use: Yes     Comment: "STATED 2 QUARTS BEER PER WEEK" NONE FOR 3 WEEKS  . Drug Use: No  . Sexual Activity: Not on file   Other Topics  Concern  . Not on file   Social History Narrative    Review of Systems: See HPI, otherwise negative ROS  Physical Exam: BP 170/100 mmHg  Pulse 78  Temp(Src) 97.9 F (36.6 C) (Temporal)  Resp 20  Ht '5\' 9"'  (1.753 m)  Wt 175 lb (79.379 kg)  BMI 25.83 kg/m2  SpO2 97% General:   Alert,  pleasant and cooperative in NAD Head:  Normocephalic and atraumatic. Neck:  Supple; no masses or thyromegaly. Lungs:  Clear throughout to auscultation.    Heart:  Regular rate and rhythm. Abdomen:  Soft, nontender and nondistended. Normal bowel sounds, without guarding, and without rebound.   Skin: Broken skin from scratching.  Impression/Plan: [name] is here for an  colonoscopy to be performed for hematochezia  Risks, benefits, limitations, and alternatives regarding  colonoscopy have been reviewed with the patient.  Questions have been answered.  All parties agreeable.   [name], MD  11/19/2014, 7:18 AM

## 2014-11-19 NOTE — Anesthesia Preprocedure Evaluation (Signed)
Anesthesia Evaluation  Patient identified by MRN, date of birth, ID band Patient awake    Reviewed: Allergy & Precautions, H&P , NPO status , Patient's Chart, lab work & pertinent test results  Airway Mallampati: II  TM Distance: >3 FB     Dental   Pulmonary shortness of breath and with exertion, Current Smoker,  Long term smoker- no recent changes.   Pulmonary exam normal       Cardiovascular hypertension, Normal cardiovascular exam    Neuro/Psych    GI/Hepatic negative GI ROS, Neg liver ROS,   Endo/Other  diabetes  Renal/GU   negative genitourinary   Musculoskeletal   Abdominal   Peds  Hematology negative hematology ROS (+)   Anesthesia Other Findings   Reproductive/Obstetrics                             Anesthesia Physical Anesthesia Plan  ASA: III  Anesthesia Plan: MAC   Post-op Pain Management:    Induction:   Airway Management Planned:   Additional Equipment:   Intra-op Plan:   Post-operative Plan:   Informed Consent: I have reviewed the patients History and Physical, chart, labs and discussed the procedure including the risks, benefits and alternatives for the proposed anesthesia with the patient or authorized representative who has indicated his/her understanding and acceptance.     Plan Discussed with: CRNA  Anesthesia Plan Comments:         Anesthesia Quick Evaluation

## 2014-11-19 NOTE — Progress Notes (Signed)
Pt friend informed that patient would need supervision for the day, pt friend stated he could not stay with patient.

## 2014-11-19 NOTE — Anesthesia Postprocedure Evaluation (Signed)
  Anesthesia Post-op Note  Patient: [name]  Procedure(s) Performed: Procedure(s) with comments: COLONOSCOPY WITH PROPOFOL (N/A) - DIABETIC-ORAL MEDS  Anesthesia type:MAC  Patient location: PACU  Post pain: Pain level controlled  Post assessment: Post-op Vital signs reviewed, Patient's Cardiovascular Status Stable, Respiratory Function Stable, Patent Airway and No signs of Nausea or vomiting  Post vital signs: Reviewed and stable  Last Vitals:  Filed Vitals:   11/19/14 1015  BP: 136/78  Pulse: 87  Temp:   Resp:     Level of consciousness: awake, alert  and patient cooperative  Complications: No apparent anesthesia complications

## 2014-11-22 ENCOUNTER — Encounter: Payer: Self-pay | Admitting: Gastroenterology

## 2014-11-25 ENCOUNTER — Ambulatory Visit (INDEPENDENT_AMBULATORY_CARE_PROVIDER_SITE_OTHER): Payer: Medicaid Other | Admitting: Surgery

## 2014-11-25 ENCOUNTER — Encounter: Payer: Self-pay | Admitting: Surgery

## 2014-11-25 VITALS — BP 147/92 | HR 82 | Temp 99.0°F | Ht 69.0 in | Wt 173.0 lb

## 2014-11-25 DIAGNOSIS — R198 Other specified symptoms and signs involving the digestive system and abdomen: Secondary | ICD-10-CM | POA: Diagnosis not present

## 2014-11-25 DIAGNOSIS — K6289 Other specified diseases of anus and rectum: Secondary | ICD-10-CM

## 2014-11-25 NOTE — Patient Instructions (Signed)
We have made you an appointment to establish a PCP for Tomorrow (11/26/14) at 2pm with Dr. Vicente Masson at [facility]. The phone number is: [phone] and their address is: [address]. (This office is in this building on the 2nd floor.) They will address your rash, your Congestive Heart Failure and Blood Pressure, as well as take care of prescribing your medications from now on so that you do not have to go to the ER to get the prescriptions.  Call your Case Worker at the [facility] office and have them change your PCP on your card to Dr. Adline Potter. This needs to be done prior to your appointment tomorrow.    We have discussed taking this area off of your buttocks during a surgery. We will first need Dr. Vicente Masson to clear you to have this done. When clearance has been received we will more forward with scheduling your surgery.  Our office will call you with the details of the surgery and office pre-op appointment needed after we have gotten Dr. Chinita Greenland approval.  If you have any questions, please call our office.

## 2014-11-25 NOTE — Progress Notes (Signed)
CC: Mass of right buttock  HPI: [name] is a pleasant 64 yo who I know quite well who presented initially with a bleeding mass on left buttock and a right sessile buttock mass.  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  Last time in clinic was transferred to ER for CHF exacerbation.  Was admitted and began on lasix, which he has not been taking.  Has follow up with new PCP tomorrow.  No acute issues.  No fevers/chills, night sweats, mild shortness of breath, no chest pain, abdominal pain, nausea/vomiting, diarrhea/constipation, dysuria/hematuria.  Active Ambulatory Problems    Diagnosis Date Noted  . Mass of anus 11/01/2014  . Anemia 11/02/2014  . Blood in stool   . First degree hemorrhoids    Resolved Ambulatory Problems    Diagnosis Date Noted  . No Resolved Ambulatory Problems   Past Medical History  Diagnosis Date  . Hypertension   . CHF (congestive heart failure)   . Shortness of breath dyspnea   . Diabetes mellitus   . Leg swelling   . Neuromuscular disorder   . Loose, teeth    Past Surgical History  Procedure Laterality Date  . Rectal exam under anesthesia N/A 11/02/2014    Procedure: RECTAL EXAM UNDER ANESTHESIA;  Surgeon: Marlyce Huge, MD;  Location: [facility] ORS;  Service: General;  Laterality: N/A;  . Rectal biopsy N/A 11/02/2014    Procedure: BIOPSY RECTAL;  Surgeon: Marlyce Huge, MD;  Location: [facility] ORS;  Service: General;  Laterality: N/A;  . Colonoscopy with propofol N/A 11/19/2014    Procedure: COLONOSCOPY WITH PROPOFOL;  Surgeon: Lucilla Lame, MD;  Location: [place];  Service: Endoscopy;  Laterality: N/A;  DIABETIC-ORAL MEDS     Medication List       This list is accurate as of: 11/25/14 12:17 PM.  Always use your most recent med list.               aspirin EC 81 MG tablet  Take 81 mg by mouth daily.     diphenhydrAMINE 25 MG tablet  Commonly known as:  BENADRYL  Take 75 mg by mouth every 6  (six) hours as needed for itching.     ferrous sulfate 325 (65 FE) MG tablet  Commonly known as:  FERROUSUL  Take 1 tablet (325 mg total) by mouth daily with breakfast.     furosemide 40 MG tablet  Commonly known as:  LASIX  Take 1 tablet (40 mg total) by mouth 2 (two) times daily.     HYDROcodone-acetaminophen 5-325 MG per tablet  Commonly known as:  NORCO/VICODIN  Take 1 tablet by mouth every 6 (six) hours as needed for moderate pain.     hydrocortisone cream 1 %  Apply topically 2 (two) times daily.     lisinopril 20 MG tablet  Commonly known as:  PRINIVIL,ZESTRIL  Take 1 tablet (20 mg total) by mouth daily.     metFORMIN 500 MG tablet  Commonly known as:  GLUCOPHAGE  Take 1 tablet (500 mg total) by mouth 2 (two) times daily.       No Known Allergies   Social History   Social History  . Marital Status: Divorced    Spouse Name: N/A  . Number of Children: N/A  . Years of Education: N/A   Occupational History  . Not on file.   Social History Main Topics  . Smoking status: Current Every Day Smoker -- 0.25 packs/day  for 20 years    Types: Cigarettes  . Smokeless tobacco: Never Used  . Alcohol Use: Yes     Comment: "STATED 2 QUARTS BEER PER WEEK" NONE FOR 3 WEEKS  . Drug Use: No  . Sexual Activity: Not on file   Other Topics Concern  . Not on file   Social History Narrative   Family History  Problem Relation Age of Onset  . Asthma Mother    ROS: Full ROS obtained, pertinent positives and negatives as above.  Blood pressure 147/92, pulse 82, temperature 99 F (37.2 C), temperature source Oral, height 5\' 9"  (1.753 m), weight 173 lb (78.472 kg). GEN: NAD/A&Ox3 FACE: no obvious facial trauma, normal external nose, normal external ears EYES: no scleral icterus, no conjunctivitis HEAD: normocephalic atraumatic CV: RRR, no MRG RESP: moving air well ABD: soft, nontender, nondistended BUTTOCKS/ANUS: Approx 2x2 x 2 cm nodule right buttocks, approx 2 cm from  anus EXT: moving all ext well, strength 5/5  Labs: no new labs Imaging: no new imaging  A/P 63 yo with BCC right gluteal fold,  Have recommended excision.  Have described procedure and risks including wound infection and dehiscence.  I will have him optimized by new PCP prior to any excision.   NEURO: cnII-XII grossly intact, sensation intact all 4 ext

## 2014-11-26 ENCOUNTER — Ambulatory Visit: Payer: Medicaid Other | Admitting: Family Medicine

## 2014-11-29 ENCOUNTER — Telehealth: Payer: Self-pay

## 2014-11-29 NOTE — Telephone Encounter (Signed)
Called to check with patient, as Dr. Chinita Greenland office states that patient cancelled his appointment.  Called patient at this time, he states that he did not call the medicaid office to get his card changed to state that his PCP is now Dr. Vicente Masson. So his appointment was cancelled.  Encouraged patient to get this taken care of as soon as possible, so that he can go to his initial appointment and establish PCP so that we can move forward with surgery plan.  Patient verbalizes understanding. Will call for update from patient in 1-2 weeks to determine when to get him back into the office.

## 2014-12-15 ENCOUNTER — Ambulatory Visit: Payer: Medicaid Other | Admitting: Family Medicine

## 2014-12-15 ENCOUNTER — Telehealth: Payer: Self-pay

## 2014-12-15 NOTE — Telephone Encounter (Signed)
Patient was supposed to see Dr. Sanda Klein this morning to establish new PCP but he cancelled because he did not have Co-pay.  According to patient, due to his medicaid he has to see [employer] . He is scheduled to see them on 9/13 at 1415.

## 2014-12-22 ENCOUNTER — Telehealth: Payer: Self-pay

## 2014-12-22 NOTE — Telephone Encounter (Signed)
Call made to [place] at this time to get records sent over from 9/13 visit with PCP. No answer. Left voicemail for [facility] requesting records from visit to be faxed over as soon as they become available.

## 2014-12-23 NOTE — Telephone Encounter (Signed)
Called back to [place] at this time. Spoke with [employer]. States that [name] came for appointment and Medicaid when checking in was reading inactive. He did not have the money to pay up front to see the physician so he left and told them that he would get his medicaid straightened out and then would call back and reschedule.

## 2014-12-30 DIAGNOSIS — K648 Other hemorrhoids: Secondary | ICD-10-CM | POA: Insufficient documentation

## 2015-01-18 ENCOUNTER — Ambulatory Visit: Payer: Medicare Other | Admitting: Sports Medicine

## 2015-05-06 LAB — TYPE AND SCREEN
ABO/RH(D): A POS
ANTIBODY SCREEN: NEGATIVE
UNIT DIVISION: 0
UNIT DIVISION: 0
UNIT DIVISION: 0

## 2015-07-26 ENCOUNTER — Ambulatory Visit: Payer: Medicare Other | Admitting: Podiatry

## 2015-07-28 ENCOUNTER — Ambulatory Visit: Payer: Medicare Other | Admitting: Podiatry

## 2015-09-13 ENCOUNTER — Observation Stay
Admission: EM | Admit: 2015-09-13 | Discharge: 2015-09-14 | Disposition: A | Discharge status: Home / Self Care | Payer: Medicare HMO | Attending: Internal Medicine | Admitting: Internal Medicine

## 2015-09-13 ENCOUNTER — Emergency Department: Payer: Medicare HMO

## 2015-09-13 ENCOUNTER — Encounter: Payer: Self-pay | Admitting: Emergency Medicine

## 2015-09-13 DIAGNOSIS — I110 Hypertensive heart disease with heart failure: Secondary | ICD-10-CM | POA: Diagnosis not present

## 2015-09-13 DIAGNOSIS — E11649 Type 2 diabetes mellitus with hypoglycemia without coma: Secondary | ICD-10-CM | POA: Diagnosis not present

## 2015-09-13 DIAGNOSIS — G709 Myoneural disorder, unspecified: Secondary | ICD-10-CM | POA: Diagnosis not present

## 2015-09-13 DIAGNOSIS — E785 Hyperlipidemia, unspecified: Secondary | ICD-10-CM | POA: Insufficient documentation

## 2015-09-13 DIAGNOSIS — F17210 Nicotine dependence, cigarettes, uncomplicated: Secondary | ICD-10-CM | POA: Insufficient documentation

## 2015-09-13 DIAGNOSIS — Z7984 Long term (current) use of oral hypoglycemic drugs: Secondary | ICD-10-CM | POA: Diagnosis not present

## 2015-09-13 DIAGNOSIS — E162 Hypoglycemia, unspecified: Secondary | ICD-10-CM | POA: Diagnosis present

## 2015-09-13 DIAGNOSIS — I509 Heart failure, unspecified: Secondary | ICD-10-CM | POA: Insufficient documentation

## 2015-09-13 DIAGNOSIS — Z7982 Long term (current) use of aspirin: Secondary | ICD-10-CM | POA: Diagnosis not present

## 2015-09-13 DIAGNOSIS — R569 Unspecified convulsions: Secondary | ICD-10-CM | POA: Insufficient documentation

## 2015-09-13 DIAGNOSIS — E1165 Type 2 diabetes mellitus with hyperglycemia: Secondary | ICD-10-CM | POA: Insufficient documentation

## 2015-09-13 DIAGNOSIS — Z79899 Other long term (current) drug therapy: Secondary | ICD-10-CM | POA: Diagnosis not present

## 2015-09-13 LAB — COMPREHENSIVE METABOLIC PANEL
ALT: 43 U/L (ref 17–63)
AST: 59 U/L — AB (ref 15–41)
Albumin: 2.8 g/dL — ABNORMAL LOW (ref 3.5–5.0)
Alkaline Phosphatase: 81 U/L (ref 38–126)
Anion gap: 7 (ref 5–15)
BILIRUBIN TOTAL: 0.6 mg/dL (ref 0.3–1.2)
BUN: 27 mg/dL — AB (ref 6–20)
CHLORIDE: 105 mmol/L (ref 101–111)
CO2: 20 mmol/L — ABNORMAL LOW (ref 22–32)
CREATININE: 1.2 mg/dL (ref 0.61–1.24)
Calcium: 8.5 mg/dL — ABNORMAL LOW (ref 8.9–10.3)
Glucose, Bld: 101 mg/dL — ABNORMAL HIGH (ref 65–99)
Potassium: 3.7 mmol/L (ref 3.5–5.1)
Sodium: 132 mmol/L — ABNORMAL LOW (ref 135–145)
TOTAL PROTEIN: 7.6 g/dL (ref 6.5–8.1)

## 2015-09-13 LAB — CBC
HEMATOCRIT: 28.2 % — AB (ref 40.0–52.0)
Hemoglobin: 9.2 g/dL — ABNORMAL LOW (ref 13.0–18.0)
MCH: 24.9 pg — AB (ref 26.0–34.0)
MCHC: 32.5 g/dL (ref 32.0–36.0)
MCV: 76.7 fL — AB (ref 80.0–100.0)
PLATELETS: 208 10*3/uL (ref 150–440)
RBC: 3.67 MIL/uL — ABNORMAL LOW (ref 4.40–5.90)
RDW: 17 % — AB (ref 11.5–14.5)
WBC: 7.1 10*3/uL (ref 3.8–10.6)

## 2015-09-13 LAB — GLUCOSE, CAPILLARY
GLUCOSE-CAPILLARY: 60 mg/dL — AB (ref 65–99)
GLUCOSE-CAPILLARY: 184 mg/dL — AB (ref 65–99)
GLUCOSE-CAPILLARY: 164 mg/dL — AB (ref 65–99)
GLUCOSE-CAPILLARY: 269 mg/dL — AB (ref 65–99)
GLUCOSE-CAPILLARY: 304 mg/dL — AB (ref 65–99)
Glucose-Capillary: 106 mg/dL — ABNORMAL HIGH (ref 65–99)
Glucose-Capillary: 53 mg/dL — ABNORMAL LOW (ref 65–99)
Glucose-Capillary: 77 mg/dL (ref 65–99)
Glucose-Capillary: 158 mg/dL — ABNORMAL HIGH (ref 65–99)
Glucose-Capillary: 257 mg/dL — ABNORMAL HIGH (ref 65–99)
Glucose-Capillary: 232 mg/dL — ABNORMAL HIGH (ref 65–99)

## 2015-09-13 LAB — TROPONIN I

## 2015-09-13 MED ORDER — ONDANSETRON HCL 4 MG/2ML IJ SOLN
4.0000 mg | Freq: Four times a day (QID) | INTRAMUSCULAR | Status: DC | PRN
Start: 2015-09-13 — End: 2015-09-14

## 2015-09-13 MED ORDER — LISINOPRIL 20 MG PO TABS
20.0000 mg | ORAL_TABLET | Freq: Every day | ORAL | Status: DC
  Administered 2015-09-13 – 2015-09-14 (×2): 20 mg via ORAL
  Filled 2015-09-13 (×2): qty 1

## 2015-09-13 MED ORDER — OXYCODONE HCL 5 MG PO TABS: 5.0000 mg | ORAL_TABLET | ORAL | Status: DC | PRN

## 2015-09-13 MED ORDER — ACETAMINOPHEN 650 MG RE SUPP: 650.0000 mg | Freq: Four times a day (QID) | RECTAL | Status: DC | PRN

## 2015-09-13 MED ORDER — FERROUS SULFATE 325 (65 FE) MG PO TABS
325.0000 mg | ORAL_TABLET | Freq: Every day | ORAL | Status: DC
  Administered 2015-09-14: 325 mg via ORAL
  Filled 2015-09-13: qty 1

## 2015-09-13 MED ORDER — MORPHINE SULFATE (PF) 2 MG/ML IV SOLN: 2.0000 mg | INTRAVENOUS | Status: DC | PRN

## 2015-09-13 MED ORDER — LIVING WELL WITH DIABETES BOOK
Freq: Once | Status: AC
  Administered 2015-09-13: 16:00:00
  Filled 2015-09-13: qty 1

## 2015-09-13 MED ORDER — DIPHENHYDRAMINE HCL 25 MG PO TABS
25.0000 mg | ORAL_TABLET | Freq: Four times a day (QID) | ORAL | Status: DC | PRN
  Filled 2015-09-13: qty 1

## 2015-09-13 MED ORDER — ATORVASTATIN CALCIUM 20 MG PO TABS
40.0000 mg | ORAL_TABLET | Freq: Every day | ORAL | Status: DC
  Administered 2015-09-13: 40 mg via ORAL
  Filled 2015-09-13: qty 2

## 2015-09-13 MED ORDER — FUROSEMIDE 40 MG PO TABS
40.0000 mg | ORAL_TABLET | Freq: Two times a day (BID) | ORAL | Status: DC
  Administered 2015-09-13 – 2015-09-14 (×2): 40 mg via ORAL
  Filled 2015-09-13 (×2): qty 1

## 2015-09-13 MED ORDER — LISINOPRIL 10 MG PO TABS
40.0000 mg | ORAL_TABLET | Freq: Once | ORAL | Status: AC
  Administered 2015-09-13: 40 mg via ORAL

## 2015-09-13 MED ORDER — DEXTROSE 50 % IV SOLN
1.0000 | Freq: Once | INTRAVENOUS | Status: AC
  Administered 2015-09-13: 50 mL via INTRAVENOUS
  Filled 2015-09-13: qty 50

## 2015-09-13 MED ORDER — DEXTROSE-NACL 5-0.45 % IV SOLN
INTRAVENOUS | Status: DC
  Administered 2015-09-13 – 2015-09-14 (×2): via INTRAVENOUS

## 2015-09-13 MED ORDER — LISINOPRIL 10 MG PO TABS
ORAL_TABLET | ORAL | Status: AC
  Administered 2015-09-13: 40 mg via ORAL
  Filled 2015-09-13: qty 4

## 2015-09-13 MED ORDER — HYDROCORTISONE 1 % EX CREA: 1.0000 "application " | TOPICAL_CREAM | Freq: Two times a day (BID) | CUTANEOUS | Status: DC | PRN

## 2015-09-13 MED ORDER — ENOXAPARIN SODIUM 40 MG/0.4ML ~~LOC~~ SOLN
40.0000 mg | SUBCUTANEOUS | Status: DC
  Administered 2015-09-13 – 2015-09-14 (×2): 40 mg via SUBCUTANEOUS
  Filled 2015-09-13 (×2): qty 0.4

## 2015-09-13 MED ORDER — ASPIRIN EC 81 MG PO TBEC
81.0000 mg | DELAYED_RELEASE_TABLET | Freq: Every day | ORAL | Status: DC
  Administered 2015-09-13 – 2015-09-14 (×2): 81 mg via ORAL
  Filled 2015-09-13 (×2): qty 1

## 2015-09-13 MED ORDER — SODIUM CHLORIDE 0.9 % IV BOLUS (SEPSIS)
1000.0000 mL | Freq: Once | INTRAVENOUS | Status: AC
  Administered 2015-09-13: 1000 mL via INTRAVENOUS

## 2015-09-13 MED ORDER — ACETAMINOPHEN 325 MG PO TABS
650.0000 mg | ORAL_TABLET | Freq: Four times a day (QID) | ORAL | Status: DC | PRN
  Administered 2015-09-14: 650 mg via ORAL
  Filled 2015-09-13: qty 2

## 2015-09-13 MED ORDER — ONDANSETRON HCL 4 MG PO TABS: 4.0000 mg | ORAL_TABLET | Freq: Four times a day (QID) | ORAL | Status: DC | PRN

## 2015-09-13 NOTE — ED Provider Notes (Signed)
[facility] Emergency Department Provider Note   ____________________________________________  Time seen: Approximately 415 AM  I have reviewed the triage vital signs and the nursing notes.   HISTORY  Chief Complaint Seizures    HPI [name] is a 65 y.o. male comes into the hospital today with some low blood sugar. The patient reports that his sugar dropped. The patient reports that he has had diabetes for a while but his doctor changed his medicine 2 months ago. He reports that he's been taking the medicine like he's been told. He reports that he has been eating without any difficulty today. The patient denies any illnesses, abdominal pain or vomiting. He reports that he feels fine now. He is unsure what happened. According to EMS there was a concern that the patient had seizure-like activity but he was unresponsive when EMS arrived. They report that the CBG read low and they did give him an amp of D50. His CBG went up to 227 and he was responsive alert and oriented after the D50. The patient also reports that he has not had an alcoholic drink in 2 days. He reports that he drinks beer or wine daily. He was brought in for evaluation.   Past Medical History  Diagnosis Date  . Hypertension   . CHF (congestive heart failure) ([place])   . Shortness of breath dyspnea   . Diabetes mellitus     Patient take Metformin.  . Leg swelling   . Neuromuscular disorder ([place])     RIGHT HAND AND FINGERS NUMB  . Loose, teeth     PATIENT STATES "HAS 6 TEETH, SOME ARE LOOSE"    Patient Active Problem List   Diagnosis Date Noted  . Blood in stool   . First degree hemorrhoids   . Anemia 11/02/2014  . Mass of anus 11/01/2014    Past Surgical History  Procedure Laterality Date  . Rectal exam under anesthesia N/A 11/02/2014    Procedure: RECTAL EXAM UNDER ANESTHESIA;  Surgeon: Marlyce Huge, MD;  Location: [facility] ORS;  Service: General;  Laterality: N/A;  .  Rectal biopsy N/A 11/02/2014    Procedure: BIOPSY RECTAL;  Surgeon: Marlyce Huge, MD;  Location: [facility] OR[facility];  Service: General;  Laterality: N/A;  . Colonoscopy with propofol N/A 11/19/2014    Procedure: COLONOSCOPY WITH PROPOFOL;  Surgeon: Lucilla Lame, MD;  Location: [place];  Service: Endoscopy;  Laterality: N/A;  DIABETIC-ORAL MEDS    Current Outpatient Rx  Name  Route  Sig  Dispense  Refill  . aspirin EC 81 MG tablet   Oral   Take 81 mg by mouth daily.         . diphenhydrAMINE (BENADRYL) 25 MG tablet   Oral   Take 75 mg by mouth every 6 (six) hours as needed for itching.         . ferrous sulfate (FERROUSUL) 325 (65 FE) MG tablet   Oral   Take 1 tablet (325 mg total) by mouth daily with breakfast.   30 tablet   3   . furosemide (LASIX) 40 MG tablet   Oral   Take 1 tablet (40 mg total) by mouth 2 (two) times daily.   30 tablet   0   . HYDROcodone-acetaminophen (NORCO/VICODIN) 5-325 MG per tablet   Oral   Take 1 tablet by mouth every 6 (six) hours as needed for moderate pain. Patient not taking: Reported on 11/11/2014   30 tablet   0   .  hydrocortisone cream 1 %   Topical   Apply topically 2 (two) times daily. Patient taking differently: Apply 1 application topically 2 (two) times daily as needed for itching.    30 g   3   . lisinopril (PRINIVIL,ZESTRIL) 20 MG tablet   Oral   Take 1 tablet (20 mg total) by mouth daily. Patient not taking: Reported on 11/11/2014   30 tablet   0   . metFORMIN (GLUCOPHAGE) 500 MG tablet   Oral   Take 1 tablet (500 mg total) by mouth 2 (two) times daily. Patient not taking: Reported on 11/11/2014   60 tablet   0     Allergies Review of patient's allergies indicates no known allergies.  Family History  Problem Relation Age of Onset  . Asthma Mother     Social History Social History  Substance Use Topics  . Smoking status: Current Every Day Smoker -- 0.25 packs/day for 20 years    Types: Cigarettes  .  Smokeless tobacco: Never Used  . Alcohol Use: Yes     Comment: "STATED 2 QUARTS BEER PER WEEK" NONE FOR 3 WEEKS    Review of Systems Constitutional: Hypoglycemia, No fever/chills Eyes: No visual changes. ENT: No sore throat. Cardiovascular: Denies chest pain. Respiratory: Denies shortness of breath. Gastrointestinal: No abdominal pain.  No nausea, no vomiting.  No diarrhea.  No constipation. Genitourinary: Negative for dysuria. Musculoskeletal: Negative for back pain. Skin: Negative for rash. Neurological: Seizure-like activity and unresponsiveness  10-point ROS otherwise negative.  ____________________________________________   PHYSICAL EXAM:  VITAL SIGNS: ED Triage Vitals  Enc Vitals Group     BP 09/13/15 0352 188/96 mmHg     Pulse Rate 09/13/15 0352 81     Resp 09/13/15 0352 15     Temp 09/13/15 0352 97.7 F (36.5 C)     Temp Source 09/13/15 0352 Oral     SpO2 09/13/15 0348 98 %     Weight 09/13/15 0352 165 lb (74.844 kg)     Height 09/13/15 0352 5\' 9"  (1.753 m)     Head Cir --      Peak Flow --      Pain Score --      Pain Loc --      Pain Edu? --      Excl. in [place]? --     Constitutional: Alert and oriented. Well appearing and in no acute distress. Eyes: Conjunctivae are normal. PERRL. EOMI. Head: Atraumatic. Nose: No congestion/rhinnorhea. Mouth/Throat: Mucous membranes are moist.  Oropharynx non-erythematous. Cardiovascular: Normal rate, regular rhythm. Grossly normal heart sounds.  Good peripheral circulation. Respiratory: Normal respiratory effort.  No retractions. Lungs CTAB. Gastrointestinal: Soft and nontender. No distention.Positive bowel sounds Musculoskeletal: No lower extremity tenderness nor edema.   Neurologic:  Normal speech and language. Cranial nerves II through XII are grossly intact with no focal motor or neuro deficits Skin:  Skin is warm, dry and intact.  Psychiatric: Mood and affect are normal.  .  ____________________________________________   LABS (all labs ordered are listed, but only abnormal results are displayed)  Labs Reviewed  GLUCOSE, CAPILLARY - Abnormal; Notable for the following:    Glucose-Capillary 106 (*)    All other components within normal limits  CBC - Abnormal; Notable for the following:    RBC 3.67 (*)    Hemoglobin 9.2 (*)    HCT 28.2 (*)    MCV 76.7 (*)    MCH 24.9 (*)    RDW 17.0 (*)  All other components within normal limits  COMPREHENSIVE METABOLIC PANEL - Abnormal; Notable for the following:    Sodium 132 (*)    CO2 20 (*)    Glucose, Bld 101 (*)    BUN 27 (*)    Calcium 8.5 (*)    Albumin 2.8 (*)    AST 59 (*)    All other components within normal limits  GLUCOSE, CAPILLARY - Abnormal; Notable for the following:    Glucose-Capillary 60 (*)    All other components within normal limits  GLUCOSE, CAPILLARY - Abnormal; Notable for the following:    Glucose-Capillary 53 (*)    All other components within normal limits  TROPONIN I  URINALYSIS COMPLETEWITH MICROSCOPIC ([facility] ONLY)  CBG MONITORING, ED   ____________________________________________  EKG  ED ECG REPORT I, Loney Hering, the attending physician, personally viewed and interpreted this ECG.   Date: 09/13/2015  EKG Time: 357  Rate: 83  Rhythm: normal sinus rhythm  Axis: normal  Intervals:none  ST&T Change: normal  ____________________________________________  RADIOLOGY  CT head: No acute finding, no cortical finding to explain seizure, generalized cortical atrophy with mild progression from 2010. ____________________________________________   PROCEDURES  Procedure(s) performed: None  Critical Care performed: No  ____________________________________________   INITIAL IMPRESSION / ASSESSMENT AND PLAN / ED COURSE  Pertinent labs & imaging results that were available during my care of the patient were reviewed by me and considered in my medical  decision making (see chart for details).  This is a 65 year old male who comes into the hospital today with low blood sugar. The patient did receive some D50 but his blood sugar did decrease quickly. He had a CBG of 60. I gave him another amp of D50 and we encouraged him to eat and drink. The patient would not eat but did drink some R juice. His repeat CBG was 53. We are can have him eat some more and then recheck his blood sugar. He will receive a liter of normal saline. If we are unable to keep the patient's blood sugar elevated we will admit him to the hospitalist service. The patient's care will be signed out to Dr. Reita Cliche who will continue to monitor the patient. We did give him some more food at this time. ____________________________________________   FINAL CLINICAL IMPRESSION(S) / ED DIAGNOSES  Final diagnoses:  Hypoglycemia      NEW MEDICATIONS STARTED DURING THIS VISIT:  New Prescriptions   No medications on file     Note:  This document was prepared using Dragon voice recognition software and may include unintentional dictation errors.    Loney Hering, MD 09/13/15 [phone]

## 2015-09-13 NOTE — H&P (Signed)
[place] at [place] NAME: [name]    MR#:  [id number]  DATE OF BIRTH:  1950-06-19   DATE OF ADMISSION:  09/13/2015  PRIMARY CARE PHYSICIAN: [name], MD   REQUESTING/REFERRING PHYSICIAN: webster  CHIEF COMPLAINT:   Chief Complaint  Patient presents with  . Seizures    HISTORY OF PRESENT ILLNESS:  [name]  is a 65 y.o. male with a known history of type 2 diabetes non insulin requiring presenting With unresponsive state, possible seizure activity witnessed by friends. Patient states he is unaware of the events surrounding this. He is now back to baseline mental status. On arrival he was noted to be hypoglycemic (reading "low") He states that he has not had any recent illnesses has been eating and drinking okay, when asked about his medications he states that he did take an extra pill he thought it was for his blood pressure. However, he seems uncertain about what medications he was actually taking prior to admission. He receives and the D50 as been eating originally his sugar increased to 227 despite these measures his decreased once again to 53.  PAST MEDICAL HISTORY:   Past Medical History  Diagnosis Date  . Hypertension   . CHF (congestive heart failure) ([place])   . Shortness of breath dyspnea   . Diabetes mellitus     Patient take Metformin.  . Leg swelling   . Neuromuscular disorder ([place])     RIGHT HAND AND FINGERS NUMB  . Loose, teeth     PATIENT STATES "HAS 6 TEETH, SOME ARE LOOSE"    PAST SURGICAL HISTORY:   Past Surgical History  Procedure Laterality Date  . Rectal exam under anesthesia N/A 11/02/2014    Procedure: RECTAL EXAM UNDER ANESTHESIA;  Surgeon: Marlyce Huge, MD;  Location: [facility] ORS;  Service: General;  Laterality: N/A;  . Rectal biopsy N/A 11/02/2014    Procedure: BIOPSY RECTAL;  Surgeon: Marlyce Huge, MD;  Location: [facility];  Service: General;  Laterality: N/A;  . Colonoscopy with  propofol N/A 11/19/2014    Procedure: COLONOSCOPY WITH PROPOFOL;  Surgeon: Lucilla Lame, MD;  Location: [facility];  Service: Endoscopy;  Laterality: N/A;  DIABETIC-ORAL MEDS    SOCIAL HISTORY:   Social History  Substance Use Topics  . Smoking status: Current Every Day Smoker -- 0.25 packs/day for 20 years    Types: Cigarettes  . Smokeless tobacco: Never Used  . Alcohol Use: Yes     Comment: "STATED 2 QUARTS BEER PER WEEK" NONE FOR 3 WEEKS    FAMILY HISTORY:   Family History  Problem Relation Age of Onset  . Asthma Mother     DRUG ALLERGIES:  No Known Allergies  REVIEW OF SYSTEMS:  REVIEW OF SYSTEMS:  CONSTITUTIONAL: Denies fevers, chills, fatigue, weakness.  EYES: Denies blurred vision, double vision, or eye pain.  EARS, NOSE, THROAT: Denies tinnitus, ear pain, hearing loss.  RESPIRATORY: denies cough, shortness of breath, wheezing  CARDIOVASCULAR: Denies chest pain, palpitations, edema.  GASTROINTESTINAL: Denies nausea, vomiting, diarrhea, abdominal pain.  GENITOURINARY: Denies dysuria, hematuria.  ENDOCRINE: Denies nocturia or thyroid problems. HEMATOLOGIC AND LYMPHATIC: Denies easy bruising or bleeding.  SKIN: Denies rash or lesions.  MUSCULOSKELETAL: Denies pain in neck, back, shoulder, knees, hips, or further arthritic symptoms.  NEUROLOGIC: Denies paralysis, paresthesias.  PSYCHIATRIC: Denies anxiety or depressive symptoms. Otherwise full review of systems performed by me is negative.   MEDICATIONS AT HOME:   Prior to  Admission medications   Medication Sig Start Date End Date Taking? Authorizing Provider  aspirin EC 81 MG tablet Take 81 mg by mouth daily.    Historical Provider, MD  diphenhydrAMINE (BENADRYL) 25 MG tablet Take 75 mg by mouth every 6 (six) hours as needed for itching.    Historical Provider, MD  ferrous sulfate (FERROUSUL) 325 (65 FE) MG tablet Take 1 tablet (325 mg total) by mouth daily with breakfast. 11/12/14   Bettey Costa, MD    furosemide (LASIX) 40 MG tablet Take 1 tablet (40 mg total) by mouth 2 (two) times daily. 11/12/14   Bettey Costa, MD  HYDROcodone-acetaminophen (NORCO/VICODIN) 5-325 MG per tablet Take 1 tablet by mouth every 6 (six) hours as needed for moderate pain. Patient not taking: Reported on 11/11/2014 11/04/14   Marlyce Huge, MD  hydrocortisone cream 1 % Apply topically 2 (two) times daily. Patient taking differently: Apply 1 application topically 2 (two) times daily as needed for itching.  11/04/14   Marlyce Huge, MD  lisinopril (PRINIVIL,ZESTRIL) 20 MG tablet Take 1 tablet (20 mg total) by mouth daily. Patient not taking: Reported on 11/11/2014 11/03/14   Bettey Costa, MD  metFORMIN (GLUCOPHAGE) 500 MG tablet Take 1 tablet (500 mg total) by mouth 2 (two) times daily. Patient not taking: Reported on 11/11/2014 11/03/14   Bettey Costa, MD      VITAL SIGNS:  Blood pressure 180/94, pulse 87, temperature 97.7 F (36.5 C), temperature source Oral, resp. rate 17, height 5\' 9"  (1.753 m), weight 165 lb (74.844 kg), SpO2 93 %.  PHYSICAL EXAMINATION:  VITAL SIGNS: Filed Vitals:   09/13/15 0615 09/13/15 0700  BP: 193/110 180/94  Pulse: 66 87  Temp:    Resp: 13 90   GENERAL:65 y.o.male currently in no acute distress.  HEAD: Normocephalic, atraumatic.  EYES: Pupils equal, round, reactive to light. Extraocular muscles intact. No scleral icterus.  MOUTH: Moist mucosal membrane. Dentition Poor. No abscess noted.  EAR, NOSE, THROAT: Clear without exudates. No external lesions.  NECK: Supple. No thyromegaly. No nodules. No JVD.  PULMONARY: Clear to ascultation, without wheeze rails or rhonci. No use of accessory muscles, Good respiratory effort. good air entry bilaterally CHEST: Nontender to palpation.  CARDIOVASCULAR: S1 and S2. Regular rate and rhythm. No murmurs, rubs, or gallops. No edema. Pedal pulses 2+ bilaterally.  GASTROINTESTINAL: Soft, nontender, nondistended. No masses. Positive bowel sounds.  No hepatosplenomegaly.  MUSCULOSKELETAL: No swelling, clubbing, or edema. Range of motion full in all extremities.  NEUROLOGIC: Cranial nerves II through XII are intact. No gross focal neurological deficits. Sensation intact. Reflexes intact.  SKIN: No ulceration, lesions, rashes, or cyanosis. Skin warm and dry. Turgor intact.  PSYCHIATRIC: Mood, affect within normal limits. The patient is awake, alert and oriented x 3. Insight, judgment intact.    LABORATORY PANEL:   CBC  Recent Labs Lab 09/13/15 0400  WBC 7.1  HGB 9.2*  HCT 28.2*  PLT 208   ------------------------------------------------------------------------------------------------------------------  Chemistries   Recent Labs Lab 09/13/15 0400  NA 132*  K 3.7  CL 105  CO2 20*  GLUCOSE 101*  BUN 27*  CREATININE 1.20  CALCIUM 8.5*  AST 59*  ALT 43  ALKPHOS 81  BILITOT 0.6   ------------------------------------------------------------------------------------------------------------------  Cardiac Enzymes  Recent Labs Lab 09/13/15 0400  TROPONINI <0.03   ------------------------------------------------------------------------------------------------------------------  RADIOLOGY:  Ct Head Wo Contrast  09/13/2015  CLINICAL DATA:  Possible seizure-like activity. EXAM: CT HEAD WITHOUT CONTRAST TECHNIQUE: Contiguous axial images were obtained from the base  of the skull through the vertex without intravenous contrast. COMPARISON:  03/11/2009 FINDINGS: Skull and Sinuses:Negative for fracture or destructive process. The visualized mastoids, middle ears, and imaged paranasal sinuses are clear. Visualized orbits: Negative. Brain: No evidence of acute infarction, hemorrhage, hydrocephalus, or mass lesion/mass effect. Generalized cortical atrophy with mild progression from 2010. Mild patchy cerebral white matter low density, greatest in the right frontal region, attributed to chronic microvascular disease. IMPRESSION: 1. No  acute finding.  No cortical finding to explain seizure. 2. Generalized cortical atrophy with mild progression from 2010. Electronically Signed   By: Monte Fantasia M.D.   On: 09/13/2015 05:04    EKG:   Orders placed or performed during the hospital encounter of 09/13/15  . ED EKG  . ED EKG    IMPRESSION AND PLAN:   65 year old African-American gentleman history of type 2 diabetes non-insulin-requiring presenting with seizure-like activity found to be hypoglycemic  1. Hyperglycemia associated with type 2 diabetes: Hold diabetic medications, initiate D5 infusion, Accu-Cheks every 2 hours until stabilized, allow patient to have regular diet 2. Tobacco abuse: Time spent 4-6 minutes on counseling, patient has desire and readiness to quit does not want any nicotine supplementation at this time 3. Essential hypertension: Lisinopril 4. Hyperlipidemia unspecified: Statin therapy    All the records are reviewed and case discussed with ED provider. Management plans discussed with the patient, family and they are in agreement.  CODE STATUS: Full  TOTAL TIME TAKING CARE OF THIS PATIENT: 36 minutes.    Wyndi Northrup,  Karenann Cai.D on 09/13/2015 at 8:03 AM  Between 7am to 6pm - Pager - [phone]  After 6pm: House Pager: - [phone]  [facility]  Office  [phone]  CC: Primary care physician; [name], MD

## 2015-09-13 NOTE — ED Notes (Signed)
Pt arrived by EMS from home with c/o possible seizure like activity reported by friends at the home. EMS reports pt was unresponsive upon arrival, check CBG, read low, gave AMP D50, post CBG= 227. Pt responsive after D50. Pt A&O x4 upon arrival to ED, unable to recall events.

## 2015-09-13 NOTE — Care Management Obs Status (Signed)
[place] NOTIFICATION   Patient Details  Name: [name] MRN: [id number] Date of Birth: April 22, 1950   Medicare Observation Status Notification Given:  Yes    Marshell Garfinkel, RN 09/13/2015, 2:10 PM

## 2015-09-13 NOTE — Progress Notes (Signed)
Spoke with patient regarding low blood sugars.  He had pill bottles at bedside and I confirmed that he was on Metformin and Glipizide 5 mg bid.  He states that last evening he felt swimmy headed and had loss of balance.  He thought it was his blood pressure so he took extra BP meds? And then went to bed.  He was found this morning with low blood sugar and EMS had to come.  Patient says he remembers waking up in the ambulance.    Discussed signs and symptoms of low blood sugars including shaky, sweaty, and silly.  Patient states "yes that's how I felt last night".   He states that he has not drank any alcohol in 2 days.  Explained that alcohol can also cause low blood sugars and if consumed, should be with food.  Patient had brand new meter in his bag and states "I've never used it".  Took meter out and showed patient how to put in strip and put blood on strip.  Discussed normal blood sugar levels of 80-120 and that low blood sugar is less than 70 mg/dl.  We also discussed the importance of monitoring and treating low blood sugars.  Patient was able to teach back information that I taught him.  He also showed me his right great toe which has an open area on the bottom that he states needs to be wrapped.  Will alert RN too.  Also ordered patient Living Well with Diabetes Booklet.  Please order A1C also to assess home glycemic control.  Thanks, Adah Perl, RN, BC-ADM Inpatient Diabetes Coordinator Pager [phone] (8a-5p)

## 2015-09-13 NOTE — ED Notes (Signed)
Pt provided with meal tray, peanut butter and OJ

## 2015-09-14 DIAGNOSIS — E11649 Type 2 diabetes mellitus with hypoglycemia without coma: Secondary | ICD-10-CM | POA: Diagnosis not present

## 2015-09-14 LAB — URINALYSIS COMPLETE WITH MICROSCOPIC (ARMC ONLY)
BILIRUBIN URINE: NEGATIVE
Glucose, UA: 50 mg/dL — AB
KETONES UR: NEGATIVE mg/dL
NITRITE: POSITIVE — AB
PH: 5 (ref 5.0–8.0)
Protein, ur: 100 mg/dL — AB
SPECIFIC GRAVITY, URINE: 1.005 (ref 1.005–1.030)
Squamous Epithelial / LPF: NONE SEEN

## 2015-09-14 LAB — GLUCOSE, CAPILLARY
GLUCOSE-CAPILLARY: 152 mg/dL — AB (ref 65–99)
GLUCOSE-CAPILLARY: 181 mg/dL — AB (ref 65–99)
GLUCOSE-CAPILLARY: 222 mg/dL — AB (ref 65–99)
GLUCOSE-CAPILLARY: 222 mg/dL — AB (ref 65–99)
Glucose-Capillary: 202 mg/dL — ABNORMAL HIGH (ref 65–99)

## 2015-09-14 LAB — HEMOGLOBIN A1C: Hgb A1c MFr Bld: 5.5 % (ref 4.0–6.0)

## 2015-09-14 NOTE — Discharge Summary (Signed)
[place] at [place] NAME: [name]    MR#:  [id number]  DATE OF BIRTH:  April 28, 1950  DATE OF ADMISSION:  09/13/2015 ADMITTING PHYSICIAN: Lytle Butte, MD  DATE OF DISCHARGE: 09/14/2015  PRIMARY CARE PHYSICIAN: [name], MD    ADMISSION DIAGNOSIS:  Hypoglycemia [E16.2]  DISCHARGE DIAGNOSIS:  Active Problems:   Hypoglycemia related to type 2 diabetes  SECONDARY DIAGNOSIS:   Past Medical History  Diagnosis Date  . Hypertension   . CHF (congestive heart failure) ([place])   . Shortness of breath dyspnea   . Diabetes mellitus     Patient take Metformin.  . Leg swelling   . Neuromuscular disorder ([place])     RIGHT HAND AND FINGERS NUMB  . Loose, teeth     PATIENT STATES "HAS 6 TEETH, SOME ARE LOOSE"    HOSPITAL COURSE:  [name]  is a 65 y.o. male admitted 09/13/2015 with chief complaint Seizures . Please see H&P performed by Lytle Butte, MD for further information. Patient presented with the above symptoms found to be hypoglycemia. It is possible that he miss took some home medications in combination with alcohol. Both of these are capable of lowering blood glucose. His glucose has stabilized since admission.  DISCHARGE CONDITIONS:   Stable/improved  CONSULTS OBTAINED:     DRUG ALLERGIES:  No Known Allergies  DISCHARGE MEDICATIONS:  Continue all home medications EXCEPT glipizide     Current Discharge Medication List    CONTINUE these medications which have NOT CHANGED   Details  aspirin EC 81 MG tablet Take 81 mg by mouth daily.    atorvastatin (LIPITOR) 40 MG tablet Take 40 mg by mouth at bedtime.    ferrous sulfate 325 (65 FE) MG tablet Take 325 mg by mouth at bedtime.    hydrOXYzine (ATARAX/VISTARIL) 25 MG tablet Take 25 mg by mouth 3 (three) times daily as needed.    metFORMIN (GLUCOPHAGE) 1000 MG tablet Take 1,000 mg by mouth 2 (two) times daily with a meal.    triamcinolone ointment (KENALOG) 0.1  % Apply 1 application topically 3 (three) times daily as needed.      STOP taking these medications     glipiZIDE (GLUCOTROL) 5 MG tablet      diphenhydrAMINE (BENADRYL) 25 MG tablet      furosemide (LASIX) 40 MG tablet      hydrocortisone cream 1 %          DISCHARGE INSTRUCTIONS:    DIET:  Diabetic diet  DISCHARGE CONDITION:  Good  ACTIVITY:  Activity as tolerated  OXYGEN:  Home Oxygen: No.   Oxygen Delivery: room air  DISCHARGE LOCATION:  home   If you experience worsening of your admission symptoms, develop shortness of breath, life threatening emergency, suicidal or homicidal thoughts you must seek medical attention immediately by calling 911 or calling your MD immediately  if symptoms less severe.  You Must read complete instructions/literature along with all the possible adverse reactions/side effects for all the Medicines you take and that have been prescribed to you. Take any new Medicines after you have completely understood and accpet all the possible adverse reactions/side effects.   Please note  You were cared for by a hospitalist during your hospital stay. If you have any questions about your discharge medications or the care you received while you were in the hospital after you are discharged, you can call the unit and asked to speak with  the hospitalist on call if the hospitalist that took care of you is not available. Once you are discharged, your primary care physician will handle any further medical issues. Please note that NO REFILLS for any discharge medications will be authorized once you are discharged, as it is imperative that you return to your primary care physician (or establish a relationship with a primary care physician if you do not have one) for your aftercare needs so that they can reassess your need for medications and monitor your lab values.    On the day of Discharge:   VITAL SIGNS:  Blood pressure 168/91, pulse 70, temperature  98.5 F (36.9 C), temperature source Oral, resp. rate 18, height 5\' 9"  (1.753 m), weight 165 lb (74.844 kg), SpO2 100 %.  I/O:   Intake/Output Summary (Last 24 hours) at 09/14/15 0833 Last data filed at 09/14/15 0829  Gross per 24 hour  Intake 1465.83 ml  Output   3450 ml  Net -1984.17 ml    PHYSICAL EXAMINATION:  GENERAL:  65 y.o.-year-old patient lying in the bed with no acute distress.  EYES: Pupils equal, round, reactive to light and accommodation. No scleral icterus. Extraocular muscles intact.  HEENT: Head atraumatic, normocephalic. Oropharynx and nasopharynx clear.  NECK:  Supple, no jugular venous distention. No thyroid enlargement, no tenderness.  LUNGS: Normal breath sounds bilaterally, no wheezing, rales,rhonchi or crepitation. No use of accessory muscles of respiration.  CARDIOVASCULAR: S1, S2 normal. No murmurs, rubs, or gallops.  ABDOMEN: Soft, non-tender, non-distended. Bowel sounds present. No organomegaly or mass.  EXTREMITIES: No pedal edema, cyanosis, or clubbing.  NEUROLOGIC: Cranial nerves II through XII are intact. Muscle strength 5/5 in all extremities. Sensation intact. Gait not checked.  PSYCHIATRIC: The patient is alert and oriented x 3.  SKIN: No obvious rash, lesion, or ulcer.   DATA REVIEW:   CBC  Recent Labs Lab 09/13/15 0400  WBC 7.1  HGB 9.2*  HCT 28.2*  PLT 208    Chemistries   Recent Labs Lab 09/13/15 0400  NA 132*  K 3.7  CL 105  CO2 20*  GLUCOSE 101*  BUN 27*  CREATININE 1.20  CALCIUM 8.5*  AST 59*  ALT 43  ALKPHOS 81  BILITOT 0.6    Cardiac Enzymes  Recent Labs Lab 09/13/15 0400  TROPONINI <0.03    Microbiology Results  Results for orders placed or performed in visit on 09/08/13  Urine culture     Status: None   Collection Time: 09/08/13  1:58 AM  Result Value Ref Range Status   Micro Text Report   Final       SOURCE: CLEAN CATCH    COMMENT                   MIXED BACTERIAL ORGANISMS   COMMENT                    RESULTS SUGGESTIVE OF CONTAMINATION   ANTIBIOTIC                                                        RADIOLOGY:  Ct Head Wo Contrast  09/13/2015  CLINICAL DATA:  Possible seizure-like activity. EXAM: CT HEAD WITHOUT CONTRAST TECHNIQUE: Contiguous axial images were obtained from the base of the skull through the vertex without  intravenous contrast. COMPARISON:  03/11/2009 FINDINGS: Skull and Sinuses:Negative for fracture or destructive process. The visualized mastoids, middle ears, and imaged paranasal sinuses are clear. Visualized orbits: Negative. Brain: No evidence of acute infarction, hemorrhage, hydrocephalus, or mass lesion/mass effect. Generalized cortical atrophy with mild progression from 2010. Mild patchy cerebral white matter low density, greatest in the right frontal region, attributed to chronic microvascular disease. IMPRESSION: 1. No acute finding.  No cortical finding to explain seizure. 2. Generalized cortical atrophy with mild progression from 2010. Electronically Signed   By: Monte Fantasia M.D.   On: 09/13/2015 05:04     Management plans discussed with the patient, family and they are in agreement.  CODE STATUS:     Code Status Orders        Start     Ordered   09/13/15 0748  Full code   Continuous     09/13/15 0748    Code Status History    Date Active Date Inactive Code Status Order ID Comments User Context   11/11/2014  5:29 PM 11/12/2014 11:25 PM Full Code [id number]  Henreitta Leber, MD Inpatient   11/01/2014  5:01 PM 11/04/2014  8:27 PM Full Code [id number]  Marlyce Huge, MD ED      TOTAL TIME TAKING CARE OF THIS PATIENT: 28 minutes.    Hower,  Karenann Cai.D on 09/14/2015 at 8:33 AM  Between 7am to 6pm - Pager - [phone]  After 6pm go to [email or web address] - Proofreader  [facility]  Office  [phone]  CC: Primary care physician; [name], MD

## 2015-09-14 NOTE — Progress Notes (Signed)
Received order to discharge patient to home, reviewed discharge instructions and home meds with patient, reminded patient to see primary care doctor, reviewed discharge educations on low blood sugar and checking sugars

## 2015-10-05 ENCOUNTER — Telehealth: Payer: Self-pay | Admitting: Gastroenterology

## 2015-10-05 NOTE — Telephone Encounter (Signed)
Received referral from [facility] for hepatitis C. Attempted to call patient to schedule an appointment with Dr Allen Norris. No answer. Left message for patient to call back and schedule appt.

## 2015-12-01 ENCOUNTER — Telehealth: Payer: Self-pay

## 2015-12-01 ENCOUNTER — Ambulatory Visit: Payer: Self-pay | Admitting: General Surgery

## 2015-12-01 NOTE — Telephone Encounter (Signed)
Patient calls in and states that he has a new "area" on his buttocks that he is concerned about. I explained to patient that he needs to see his Primary Care Physician [facility]) prior to being seen in our office once again as we have seen him and need Medical Clearance to take him to surgery.  Spoke with Jinny Blossom at [facility] at this time whom explains patient will definitely need to be seen as the last time he has seen his PCP was in 08/2015 and he has cancelled or No Show the last 5 visits.   Returned phone call to patient and he was given the number to [facility] to call and make an appointment. I will place patient on schedule once a referral and clearance has been obtained from PCP.

## 2015-12-05 ENCOUNTER — Telehealth: Payer: Self-pay

## 2015-12-05 NOTE — Telephone Encounter (Signed)
Spoke with Dr. Lamonte Sakai whom explains that they have seen the patient in the office but patient is in need of Echo and Carotid dopplers prior to being cleared by PCP. These tests have been scheduled within the next week and then update on clearance will be given.

## 2015-12-06 ENCOUNTER — Telehealth: Payer: Self-pay

## 2015-12-06 NOTE — Telephone Encounter (Signed)
Medical Clearance obtained  from [facility].

## 2016-01-02 ENCOUNTER — Ambulatory Visit (INDEPENDENT_AMBULATORY_CARE_PROVIDER_SITE_OTHER): Payer: Medicare HMO | Admitting: General Surgery

## 2016-01-02 ENCOUNTER — Encounter: Payer: Self-pay | Admitting: General Surgery

## 2016-01-02 ENCOUNTER — Telehealth: Payer: Self-pay

## 2016-01-02 VITALS — BP 196/100 | HR 84 | Temp 98.6°F | Ht 69.0 in | Wt 184.0 lb

## 2016-01-02 DIAGNOSIS — R198 Other specified symptoms and signs involving the digestive system and abdomen: Secondary | ICD-10-CM | POA: Diagnosis not present

## 2016-01-02 DIAGNOSIS — K6289 Other specified diseases of anus and rectum: Secondary | ICD-10-CM

## 2016-01-02 NOTE — Telephone Encounter (Signed)
MRI of Pelvis scheduled for 01/09/16 at [facility]. Arrive at 1130am. No prep necessary.

## 2016-01-02 NOTE — Patient Instructions (Signed)
We will order an MRI of your Pelvis and we will talk with our collegues at the [facility] about what our next plan needs to be.  I will call you to let you know when your MRI is scheduled and when your follow-up has been scheduled.

## 2016-01-02 NOTE — Progress Notes (Signed)
Outpatient Surgical Follow Up  01/02/2016  [name] is an 65 y.o. male.   Chief Complaint  Patient presents with  . Mass    Right Buttocks    HPI: 65 year old male returns to clinic for follow-up of a perirectal mass. Patient had the area biopsied last July. At that point was diagnosed with postop to be a basal cell carcinoma. He was sent for preoperative, medical clearance. He did not follow up again until today, 14 months later. Patient reports an interval the area has grown in size. It is now uncomfortable to sit on and he has been keeping a bandage on the area because it is "raw". Patient admits that he has not been taking his blood pressure medication as prescribed and has significantly elevated blood pressure today clinic. Patient also reports a heavy use of alcohol which he decided to quit abruptly over the weekend. He currently denies any fevers, chills, nausea, vomiting, chest pain, shortness of breath, diarrhea, constipation. He is concerned about the area of his rectum.  Past Medical History:  Diagnosis Date  . CHF (congestive heart failure) ([place])   . Diabetes mellitus    Patient take Metformin.  Marland Kitchen Hypertension   . Leg swelling   . Loose, teeth    PATIENT STATES "HAS 6 TEETH, SOME ARE LOOSE"  . Neuromuscular disorder ([place])    RIGHT HAND AND FINGERS NUMB  . Shortness of breath dyspnea     Past Surgical History:  Procedure Laterality Date  . COLONOSCOPY WITH PROPOFOL N/A 11/19/2014   Procedure: COLONOSCOPY WITH PROPOFOL;  Surgeon: Lucilla Lame, MD;  Location: [place];  Service: Endoscopy;  Laterality: N/A;  DIABETIC-ORAL MEDS  . RECTAL BIOPSY N/A 11/02/2014   Procedure: BIOPSY RECTAL;  Surgeon: Marlyce Huge, MD;  Location: [facility];  Service: General;  Laterality: N/A;  . RECTAL EXAM UNDER ANESTHESIA N/A 11/02/2014   Procedure: RECTAL EXAM UNDER ANESTHESIA;  Surgeon: Marlyce Huge, MD;  Location: [facility];  Service: General;  Laterality: N/A;     Family History  Problem Relation Age of Onset  . Asthma Mother     Social History:  reports that he has been smoking Cigarettes.  He has a 5.00 pack-year smoking history. He has never used smokeless tobacco. He reports that he drinks alcohol. He reports that he uses drugs, including Marijuana.  Allergies: No Known Allergies  Medications reviewed.    ROS A multipoint review of systems was completed. All pertinent positives and negatives are documented within the history of present illness and remainder are negative.   BP (!) 196/100   Pulse 84   Temp 98.6 F (37 C) (Oral)   Ht 5\' 9"  (1.753 m)   Wt 83.5 kg (184 lb)   BMI 27.17 kg/m   Physical Exam Gen.: No acute distress Neck: Supple and nontender. A palpable 1 cm superficial abnormality below the left mandible. Chest: Clear to auscultation Heart: Regular rate and rhythm Abdomen: Soft and nontender Rectum: Difficult to obtain a full exam because patient will not relax. There appears to be a 4 x 6 cm area of raised, hard, discolored skin going down to the anus on the right side. There is an area of ulceration in the center of this the measures approximately 2 cm in diameter. Extremities: Right lower extremity in a walking boot with evidence of a bleeding ulcer. Pedal edema noted bilaterally.    No results found for this or any previous visit (from the past 48 hour(s)). No  results found.  Assessment/Plan:  1. Mass of perianal area 65 year old male with a mass of his perianal area on the right side. Very concerning for malignancy. Previous biopsy of basal cell carcinoma is a possibility however this appears to be more squamous in nature on my exam. Given the large size and obvious discrepancy with the described previous exam discussed that we will obtain imaging of the pelvis to further evaluate for nodal spread. MRI of the pelvis we ordered. We will also put him on the list for tumor board to be discussed in  multidisciplinary setting. Patient we made an appointment to follow-up the week after his MRI to discuss the findings and recommendations from tumor board. Patient voiced understanding of his instructions and that he will make his follow-up appointment. Also had a long conversation with the patient about his uncontrolled blood pressure and the need for him to take his medication as prescribed. He voiced understanding and states that he well. - MR Pelvis W Wo Contrast; Future  A total 25 minutes was used for this encounter with greater than 50% of it used for counseling and coordination of care.   Clayburn Pert, MD FACS General Surgeon  01/02/2016,12:58 PM

## 2016-01-03 NOTE — Telephone Encounter (Signed)
Call made to patient at this time to inform of MRI appointment. He read back appointment information.  I also let him know that I would call him with results as soon as I have them available and let him know what next step in the plan for him will be. He verbalizes understanding of this.

## 2016-01-09 ENCOUNTER — Ambulatory Visit
Admission: RE | Admit: 2016-01-09 | Discharge: 2016-01-09 | Disposition: A | Discharge status: Home / Self Care | Payer: Medicare HMO | Source: Ambulatory Visit | Attending: General Surgery | Admitting: General Surgery

## 2016-01-09 DIAGNOSIS — M1288 Other specific arthropathies, not elsewhere classified, other specified site: Secondary | ICD-10-CM | POA: Insufficient documentation

## 2016-01-09 DIAGNOSIS — K6289 Other specified diseases of anus and rectum: Secondary | ICD-10-CM

## 2016-01-09 DIAGNOSIS — R591 Generalized enlarged lymph nodes: Secondary | ICD-10-CM | POA: Insufficient documentation

## 2016-01-09 DIAGNOSIS — R198 Other specified symptoms and signs involving the digestive system and abdomen: Secondary | ICD-10-CM | POA: Diagnosis present

## 2016-01-09 LAB — POCT I-STAT CREATININE: Creatinine, Ser: 1.1 mg/dL (ref 0.61–1.24)

## 2016-01-09 MED ORDER — GADOBENATE DIMEGLUMINE 529 MG/ML IV SOLN
20.0000 mL | Freq: Once | INTRAVENOUS | Status: AC | PRN
  Administered 2016-01-09: 16 mL via INTRAVENOUS

## 2016-01-16 ENCOUNTER — Telehealth: Payer: Self-pay

## 2016-01-16 NOTE — Telephone Encounter (Signed)
Spoke with Dr. Adonis Huguenin in regards to patient's MRI result. Patient has been discussed at case conference and will need to see Dr. Adonis Huguenin this week. Scheduled at 3pm in the [place] office on Thursday (01/19/16).  Call made to patient and both Emergency Contacts without success. Penny (EC) I left a message. I was unable to leave message on patient or other contacts voicemail.

## 2016-01-16 NOTE — Telephone Encounter (Signed)
Spoke with patient's sister, Kieth Brightly at this time. Appointment information given. She will assure that patient makes it to his appointment on Thursday.

## 2016-01-19 ENCOUNTER — Encounter: Payer: Self-pay | Admitting: General Surgery

## 2016-01-19 ENCOUNTER — Ambulatory Visit (INDEPENDENT_AMBULATORY_CARE_PROVIDER_SITE_OTHER): Payer: Medicare HMO | Admitting: General Surgery

## 2016-01-19 VITALS — BP 170/85 | HR 90 | Temp 98.5°F | Wt 169.0 lb

## 2016-01-19 DIAGNOSIS — C44529 Squamous cell carcinoma of skin of other part of trunk: Secondary | ICD-10-CM | POA: Diagnosis not present

## 2016-01-19 DIAGNOSIS — R591 Generalized enlarged lymph nodes: Secondary | ICD-10-CM | POA: Diagnosis not present

## 2016-01-19 NOTE — Progress Notes (Signed)
Outpatient Surgical Follow Up  01/19/2016  [name] is an 66 y.o. male.   Chief Complaint  Patient presents with  . Follow-up    MRI Buttocks- Discuss Plan    HPI: 65 year old male returns to clinic to discuss MRI results as well as the results of the tumor board discussion. He reports that the area to his buttocks continues to be uncomfortable when he sits on it but has learned a method of keeping a bandage to decrease the discomfort. He denies any other symptoms of fevers, chills, nausea, vomiting, chest pain, shortness of breath, diarrhea, constipation. His only active complaint is that of pain in his buttocks. He is ready to do something about this and whatever is required to safely take care of him.  Past Medical History:  Diagnosis Date  . CHF (congestive heart failure) ([place])   . Diabetes mellitus    Patient take Metformin.  Marland Kitchen Hypertension   . Leg swelling   . Loose, teeth    PATIENT STATES "HAS 6 TEETH, SOME ARE LOOSE"  . Neuromuscular disorder ([place])    RIGHT HAND AND FINGERS NUMB  . Shortness of breath dyspnea     Past Surgical History:  Procedure Laterality Date  . COLONOSCOPY WITH PROPOFOL N/A 11/19/2014   Procedure: COLONOSCOPY WITH PROPOFOL;  Surgeon: Lucilla Lame, MD;  Location: [place];  Service: Endoscopy;  Laterality: N/A;  DIABETIC-ORAL MEDS  . RECTAL BIOPSY N/A 11/02/2014   Procedure: BIOPSY RECTAL;  Surgeon: Marlyce Huge, MD;  Location: [facility];  Service: General;  Laterality: N/A;  . RECTAL EXAM UNDER ANESTHESIA N/A 11/02/2014   Procedure: RECTAL EXAM UNDER ANESTHESIA;  Surgeon: Marlyce Huge, MD;  Location: [facility];  Service: General;  Laterality: N/A;    Family History  Problem Relation Age of Onset  . Asthma Mother     Social History:  reports that he has been smoking Cigarettes.  He has a 5.00 pack-year smoking history. He has never used smokeless tobacco. He reports that he drinks alcohol. He reports that he uses  drugs, including Marijuana.  Allergies: No Known Allergies  Medications reviewed.    ROS A multipoint review of systems was completed, all pertinent positives and negatives are documented within the history of present illness and the remainder are negative   BP (!) 170/85   Pulse 90   Temp 98.5 F (36.9 C) (Oral)   Wt 76.7 kg (169 lb)   BMI 24.96 kg/m   Physical Exam  Physical exam: Gen.: No acute distress Neck: Supple and nontender Chest: Clear to auscultation Heart: Rate and rhythm Abdomen: Soft and nontender. Bilateral groins examined with palpable lymphadenopathy to bilateral groins Rectum: 4 x 6 cm area of raised, hard, discolored skin going to within 1 cm of the anus on the right side. Central ulceration again visualized but currently not bleeding. Extremities: Right lower extremity and walking boot and bilateral pedal edema.    No results found for this or any previous visit (from the past 48 hour(s)). No results found. MRI of the pelvis: IMPRESSION: 1. Bilateral inguinal and right external iliac adenopathy with mild retroperitoneal adenopathy associated with a cutaneous and immediately subcutaneous thickening/mass along the right inferior margin of the intergluteal cleft, just above the gluteal sulcus as shown on image 30/5 and image 8/6. Cutaneous infection or tumor could give this appearance, and if the appearance is verrucous, a condyloma might be considered. Cutaneous biopsy likely warranted.  Assessment/Plan:  1. 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 Patient was discussed in tumor board last week and felt due to its location he would require a colostomy in order for the wound to heal. This is a tumor that is very amenable to radiation therapy per the radiologist and oncologist. If there is no evidence of metastatic spread this could be treated primarily with radiation.  2.  Lymphadenopathy Per the MRI and exam patient has bilateral inguinal lymphadenopathy in addition to external iliac and retroperitoneal adenopathy seen on imaging. This is highly suspicious for metastatic disease. However, there was evidence of some lymphadenopathy a year ago on CT scan. Given these findings this may represent some other problem then metastatic cancer. Plan for an excisional biopsy in the operating room of the right inguinal lymphadenopathy that is palpable. We'll plan to do this next week on October 19. The procedure was described in detail the patient. He described understanding and desired to proceed.     Clayburn Pert, MD FACS General Surgeon  01/19/2016,4:03 PM

## 2016-01-20 ENCOUNTER — Telehealth: Payer: Self-pay | Admitting: General Surgery

## 2016-01-20 NOTE — Telephone Encounter (Signed)
Pt advised of pre op date/time and sx date. Sx: 01/26/16 with Dr Adonis Huguenin for right inguinal lymph node biopsy.  Pre op: 01/23/16 @ 1:00pm--Office.   Patient made aware to call [phone], between 1-3:00pm the day before surgery, to find out what time to arrive.

## 2016-01-23 ENCOUNTER — Ambulatory Visit
Admission: RE | Admit: 2016-01-23 | Discharge: 2016-01-23 | Disposition: A | Discharge status: Home / Self Care | Payer: Medicare Other | Source: Ambulatory Visit | Attending: Anesthesiology | Admitting: Anesthesiology

## 2016-01-23 ENCOUNTER — Encounter
Admission: RE | Admit: 2016-01-23 | Discharge: 2016-01-23 | Disposition: A | Discharge status: Home / Self Care | Payer: Medicare Other | Source: Ambulatory Visit | Attending: General Surgery | Admitting: General Surgery

## 2016-01-23 DIAGNOSIS — Z7984 Long term (current) use of oral hypoglycemic drugs: Secondary | ICD-10-CM | POA: Diagnosis not present

## 2016-01-23 DIAGNOSIS — R918 Other nonspecific abnormal finding of lung field: Secondary | ICD-10-CM | POA: Insufficient documentation

## 2016-01-23 DIAGNOSIS — D500 Iron deficiency anemia secondary to blood loss (chronic): Secondary | ICD-10-CM | POA: Diagnosis present

## 2016-01-23 DIAGNOSIS — I10 Essential (primary) hypertension: Secondary | ICD-10-CM | POA: Insufficient documentation

## 2016-01-23 DIAGNOSIS — F17210 Nicotine dependence, cigarettes, uncomplicated: Secondary | ICD-10-CM | POA: Diagnosis present

## 2016-01-23 DIAGNOSIS — E119 Type 2 diabetes mellitus without complications: Secondary | ICD-10-CM | POA: Diagnosis present

## 2016-01-23 DIAGNOSIS — Z01818 Encounter for other preprocedural examination: Secondary | ICD-10-CM | POA: Insufficient documentation

## 2016-01-23 DIAGNOSIS — I509 Heart failure, unspecified: Secondary | ICD-10-CM

## 2016-01-23 DIAGNOSIS — Z01812 Encounter for preprocedural laboratory examination: Secondary | ICD-10-CM | POA: Insufficient documentation

## 2016-01-23 DIAGNOSIS — D649 Anemia, unspecified: Secondary | ICD-10-CM | POA: Diagnosis present

## 2016-01-23 DIAGNOSIS — I110 Hypertensive heart disease with heart failure: Secondary | ICD-10-CM | POA: Diagnosis present

## 2016-01-23 DIAGNOSIS — R590 Localized enlarged lymph nodes: Secondary | ICD-10-CM | POA: Diagnosis present

## 2016-01-23 DIAGNOSIS — C20 Malignant neoplasm of rectum: Secondary | ICD-10-CM | POA: Diagnosis present

## 2016-01-23 DIAGNOSIS — R05 Cough: Secondary | ICD-10-CM | POA: Insufficient documentation

## 2016-01-23 DIAGNOSIS — R059 Cough, unspecified: Secondary | ICD-10-CM

## 2016-01-23 DIAGNOSIS — K5900 Constipation, unspecified: Secondary | ICD-10-CM | POA: Diagnosis present

## 2016-01-23 LAB — CBC
HCT: 21.1 % — ABNORMAL LOW (ref 40.0–52.0)
HEMOGLOBIN: 6.4 g/dL — AB (ref 13.0–18.0)
MCH: 19.1 pg — AB (ref 26.0–34.0)
MCHC: 30.4 g/dL — ABNORMAL LOW (ref 32.0–36.0)
MCV: 62.7 fL — ABNORMAL LOW (ref 80.0–100.0)
PLATELETS: 239 10*3/uL (ref 150–440)
RBC: 3.37 MIL/uL — AB (ref 4.40–5.90)
RDW: 17.2 % — ABNORMAL HIGH (ref 11.5–14.5)
WBC: 6.2 10*3/uL (ref 3.8–10.6)

## 2016-01-23 LAB — BASIC METABOLIC PANEL
ANION GAP: 4 — AB (ref 5–15)
BUN: 19 mg/dL (ref 6–20)
CALCIUM: 8 mg/dL — AB (ref 8.9–10.3)
CO2: 25 mmol/L (ref 22–32)
CREATININE: 1.25 mg/dL — AB (ref 0.61–1.24)
Chloride: 105 mmol/L (ref 101–111)
GFR, EST NON AFRICAN AMERICAN: 60 mL/min — AB (ref >60)
Glucose, Bld: 167 mg/dL — ABNORMAL HIGH (ref 65–99)
Potassium: 4 mmol/L (ref 3.5–5.1)
SODIUM: 134 mmol/L — AB (ref 135–145)

## 2016-01-23 NOTE — Pre-Procedure Instructions (Signed)
Abnormal labs faxed to Dr. Reginal Lutes office.  RBC 3.37  HGB 6.4  Hct 21.1

## 2016-01-23 NOTE — Pre-Procedure Instructions (Signed)
Dr. Theodore Demark consulted after patient interview complete and stated does not need to see anesthesiologist until AM of surgery.

## 2016-01-23 NOTE — Pre-Procedure Instructions (Signed)
Dr. Reginal Lutes office notified that a request for medical clearance was sent to Dr. [name] office due to abnormal labs.

## 2016-01-23 NOTE — Patient Instructions (Signed)
  Your procedure is scheduled [place]. 01/26/16 Report to Day Surgery. [facility]) To find out your arrival time please call [phone] between 1PM - 3PM on Wed. 01/25/16.  Remember: Instructions that are not followed completely may result in serious medical risk, up to and including death, or upon the discretion of your surgeon and anesthesiologist your surgery may need to be rescheduled.    __x__ 1. Do not eat food or drink liquids after midnight. No gum chewing or hard candies.     __x__ 2. No Alcohol for 24 hours before or after surgery.   __x__ 3. Do Not Smoke For 24 Hours Prior to Your Surgery.   ____ 4. Bring all medications with you on the day of surgery if instructed.    __x__ 5. Notify your doctor if there is any change in your medical condition     (cold, fever, infections).       Do not wear jewelry, make-up, hairpins, clips or nail polish.  Do not wear lotions, powders, or perfumes. You may wear deodorant.  Do not shave 48 hours prior to surgery. Men may shave face and neck.  Do not bring valuables to the hospital.    [facility] is not responsible for any belongings or valuables.               Contacts, dentures or bridgework may not be worn into surgery.  Leave your suitcase in the car. After surgery it may be brought to your room.  For patients admitted to the hospital, discharge time is determined by your                treatment team.   Patients discharged the day of surgery will not be allowed to drive home.   Please read over the following fact sheets that you were given:      __x__ Take these medicines the morning of surgery with A SIP OF WATER:  hydrOXYzine (ATARAX/VISTARIL) 25 MG tablet (if needed)  1. lisinopril (PRINIVIL,ZESTRIL) 40 MG tablet  2.   3.   4.  5.  6.  ____ Fleet Enema (as directed)   ____ Use CHG Soap as directed  ____ Use inhalers on the day of surgery  ___x_ Stop metformin 2 days prior to surgery    ____ Take 1/2 of  usual insulin dose the night before surgery and none on the morning of surgery.   __x__ Stop aspirin on today  ___x_ Stop Anti-inflammatories today  Tylenol is OK to take   ____ Stop supplements until after surgery.    ____ Bring C-Pap to the hospital.

## 2016-01-24 ENCOUNTER — Telehealth: Payer: Self-pay

## 2016-01-24 ENCOUNTER — Encounter: Payer: Self-pay | Admitting: Emergency Medicine

## 2016-01-24 ENCOUNTER — Inpatient Hospital Stay
Admission: EM | Admit: 2016-01-24 | Discharge: 2016-01-26 | DRG: 803 | Disposition: A | Discharge status: Home / Self Care | Payer: Medicare Other | Attending: Internal Medicine | Admitting: Internal Medicine

## 2016-01-24 DIAGNOSIS — Z7984 Long term (current) use of oral hypoglycemic drugs: Secondary | ICD-10-CM | POA: Diagnosis not present

## 2016-01-24 DIAGNOSIS — C20 Malignant neoplasm of rectum: Secondary | ICD-10-CM | POA: Diagnosis present

## 2016-01-24 DIAGNOSIS — D500 Iron deficiency anemia secondary to blood loss (chronic): Secondary | ICD-10-CM | POA: Diagnosis present

## 2016-01-24 DIAGNOSIS — F17210 Nicotine dependence, cigarettes, uncomplicated: Secondary | ICD-10-CM | POA: Diagnosis present

## 2016-01-24 DIAGNOSIS — I509 Heart failure, unspecified: Secondary | ICD-10-CM | POA: Diagnosis present

## 2016-01-24 DIAGNOSIS — R590 Localized enlarged lymph nodes: Secondary | ICD-10-CM

## 2016-01-24 DIAGNOSIS — R229 Localized swelling, mass and lump, unspecified: Secondary | ICD-10-CM

## 2016-01-24 DIAGNOSIS — D649 Anemia, unspecified: Secondary | ICD-10-CM | POA: Diagnosis present

## 2016-01-24 DIAGNOSIS — K5900 Constipation, unspecified: Secondary | ICD-10-CM | POA: Diagnosis present

## 2016-01-24 DIAGNOSIS — I110 Hypertensive heart disease with heart failure: Secondary | ICD-10-CM | POA: Diagnosis present

## 2016-01-24 DIAGNOSIS — E119 Type 2 diabetes mellitus without complications: Secondary | ICD-10-CM | POA: Diagnosis present

## 2016-01-24 LAB — IRON AND TIBC
Iron: 10 ug/dL — ABNORMAL LOW (ref 45–182)
Saturation Ratios: 3 % — ABNORMAL LOW (ref 17.9–39.5)
TIBC: 361 ug/dL (ref 250–450)
UIBC: 351 ug/dL

## 2016-01-24 LAB — CBC WITH DIFFERENTIAL/PLATELET
Basophils Absolute: 0 10*3/uL (ref 0–0.1)
Basophils Relative: 1 %
EOS ABS: 0.2 10*3/uL (ref 0–0.7)
EOS PCT: 3 %
HCT: 23 % — ABNORMAL LOW (ref 40.0–52.0)
Hemoglobin: 6.9 g/dL — ABNORMAL LOW (ref 13.0–18.0)
LYMPHS PCT: 19 %
Lymphs Abs: 1.2 10*3/uL (ref 1.0–3.6)
MCH: 18.9 pg — AB (ref 26.0–34.0)
MCHC: 29.9 g/dL — AB (ref 32.0–36.0)
MCV: 63.3 fL — ABNORMAL LOW (ref 80.0–100.0)
Monocytes Absolute: 0.8 10*3/uL (ref 0.2–1.0)
Monocytes Relative: 13 %
NEUTROS ABS: 4.3 10*3/uL (ref 1.4–6.5)
NEUTROS PCT: 64 %
PLATELETS: 269 10*3/uL (ref 150–440)
RBC: 3.64 MIL/uL — ABNORMAL LOW (ref 4.40–5.90)
RDW: 17.3 % — ABNORMAL HIGH (ref 11.5–14.5)
WBC: 6.6 10*3/uL (ref 3.8–10.6)

## 2016-01-24 LAB — GLUCOSE, CAPILLARY
GLUCOSE-CAPILLARY: 78 mg/dL (ref 65–99)
GLUCOSE-CAPILLARY: 147 mg/dL — AB (ref 65–99)

## 2016-01-24 LAB — PREPARE RBC (CROSSMATCH)

## 2016-01-24 LAB — BASIC METABOLIC PANEL
Anion gap: 4 — ABNORMAL LOW (ref 5–15)
BUN: 17 mg/dL (ref 6–20)
CHLORIDE: 101 mmol/L (ref 101–111)
CO2: 25 mmol/L (ref 22–32)
CREATININE: 1.31 mg/dL — AB (ref 0.61–1.24)
Calcium: 8.1 mg/dL — ABNORMAL LOW (ref 8.9–10.3)
GFR calc Af Amer: >60 mL/min (ref >60)
GFR, EST NON AFRICAN AMERICAN: 56 mL/min — AB (ref >60)
Glucose, Bld: 204 mg/dL — ABNORMAL HIGH (ref 65–99)
Potassium: 4.4 mmol/L (ref 3.5–5.1)
SODIUM: 130 mmol/L — AB (ref 135–145)

## 2016-01-24 LAB — FERRITIN: Ferritin: 8 ng/mL — ABNORMAL LOW (ref 24–336)

## 2016-01-24 LAB — HEMOGLOBIN AND HEMATOCRIT, BLOOD
HEMATOCRIT: 21.2 % — AB (ref 40.0–52.0)
HEMOGLOBIN: 6.4 g/dL — AB (ref 13.0–18.0)

## 2016-01-24 MED ORDER — SODIUM CHLORIDE 0.9 % IV SOLN
Freq: Once | INTRAVENOUS | Status: AC
  Administered 2016-01-24: 17:00:00 via INTRAVENOUS

## 2016-01-24 MED ORDER — HYDRALAZINE HCL 20 MG/ML IJ SOLN
20.0000 mg | Freq: Once | INTRAMUSCULAR | Status: AC
  Administered 2016-01-24: 20 mg via INTRAVENOUS
  Filled 2016-01-24: qty 1

## 2016-01-24 MED ORDER — ONDANSETRON HCL 4 MG/2ML IJ SOLN: 4.0000 mg | Freq: Four times a day (QID) | INTRAMUSCULAR | Status: DC | PRN

## 2016-01-24 MED ORDER — IPRATROPIUM-ALBUTEROL 0.5-2.5 (3) MG/3ML IN SOLN
3.0000 mL | Freq: Four times a day (QID) | RESPIRATORY_TRACT | Status: DC
Start: 2016-01-24 — End: 2016-01-24

## 2016-01-24 MED ORDER — ACETAMINOPHEN 650 MG RE SUPP
650.0000 mg | Freq: Four times a day (QID) | RECTAL | Status: DC | PRN
Start: 2016-01-24 — End: 2016-01-26

## 2016-01-24 MED ORDER — BISACODYL 5 MG PO TBEC: 5.0000 mg | DELAYED_RELEASE_TABLET | Freq: Every day | ORAL | Status: DC | PRN

## 2016-01-24 MED ORDER — ONDANSETRON HCL 4 MG PO TABS: 4.0000 mg | ORAL_TABLET | Freq: Four times a day (QID) | ORAL | Status: DC | PRN

## 2016-01-24 MED ORDER — HYDROCODONE-ACETAMINOPHEN 5-325 MG PO TABS: 1.0000 | ORAL_TABLET | ORAL | Status: DC | PRN

## 2016-01-24 MED ORDER — DOCUSATE SODIUM 100 MG PO CAPS
100.0000 mg | ORAL_CAPSULE | Freq: Two times a day (BID) | ORAL | Status: DC
  Administered 2016-01-24 – 2016-01-25 (×3): 100 mg via ORAL
  Filled 2016-01-24 (×3): qty 1

## 2016-01-24 MED ORDER — FERROUS SULFATE 325 (65 FE) MG PO TABS
325.0000 mg | ORAL_TABLET | Freq: Every day | ORAL | Status: DC
  Administered 2016-01-24 – 2016-01-25 (×2): 325 mg via ORAL
  Filled 2016-01-24 (×2): qty 1

## 2016-01-24 MED ORDER — METFORMIN HCL 500 MG PO TABS
1000.0000 mg | ORAL_TABLET | Freq: Two times a day (BID) | ORAL | Status: DC
  Administered 2016-01-25: 1000 mg via ORAL
  Filled 2016-01-24: qty 2

## 2016-01-24 MED ORDER — LISINOPRIL 20 MG PO TABS
40.0000 mg | ORAL_TABLET | Freq: Every day | ORAL | Status: DC
  Administered 2016-01-25 – 2016-01-26 (×2): 40 mg via ORAL
  Filled 2016-01-24 (×2): qty 2

## 2016-01-24 MED ORDER — ACETAMINOPHEN 325 MG PO TABS
650.0000 mg | ORAL_TABLET | Freq: Four times a day (QID) | ORAL | Status: DC | PRN
Start: 2016-01-24 — End: 2016-01-26

## 2016-01-24 MED ORDER — HYDRALAZINE HCL 25 MG PO TABS
25.0000 mg | ORAL_TABLET | Freq: Three times a day (TID) | ORAL | Status: DC
  Administered 2016-01-24 – 2016-01-26 (×6): 25 mg via ORAL
  Filled 2016-01-24 (×6): qty 1

## 2016-01-24 MED ORDER — IPRATROPIUM-ALBUTEROL 0.5-2.5 (3) MG/3ML IN SOLN: 3.0000 mL | Freq: Four times a day (QID) | RESPIRATORY_TRACT | Status: DC | PRN

## 2016-01-24 MED ORDER — ATORVASTATIN CALCIUM 20 MG PO TABS
40.0000 mg | ORAL_TABLET | Freq: Every day | ORAL | Status: DC
  Administered 2016-01-24 – 2016-01-25 (×2): 40 mg via ORAL
  Filled 2016-01-24 (×2): qty 2

## 2016-01-24 NOTE — Consult Note (Signed)
Full consult to follow. Patient well-known to our service and to Dr. Adonis Huguenin. Patient is scheduled for surgery by Dr. Adonis Huguenin on Thursday morning. I will discuss with him tomorrow morning his plans as the patient is currently being transfused. Patient understands this plan and I will discuss it more with him tomorrow

## 2016-01-24 NOTE — Pre-Procedure Instructions (Signed)
Received a phone call from Dr. [name] office regarding medical clearance faxed to their office for upcoming surgery.  Pt has not been seen in their office recently, pt reports he is in the ED at this time related to abnormal labs.

## 2016-01-24 NOTE — ED Provider Notes (Signed)
[facility] Emergency Department Provider Note  ____________________________________________   First MD Initiated Contact with Patient 01/24/16 1055     (approximate)  I have reviewed the triage vital signs and the nursing notes.   HISTORY  Chief Complaint Other   HPI [name] is a 65 y.o. male with a history of CHF as well as diabetes who is presenting to the emergency department today after having a hemoglobin drawn outpatient that was 6.4. The patient also states that he has had several days of weakness with shortness of breath with exertion. Denies any chest pain. Denies any fever. Says that he was being preoperatively evaluated by Dr. Adonis Huguenin for a mass excision on his buttocks. He denies any blood in the stool. Says that he has needed a transfusion in the past.   Past Medical History:  Diagnosis Date  . CHF (congestive heart failure) ([place])   . Diabetes mellitus    Patient take Metformin.  Marland Kitchen Hypertension   . Leg swelling   . Loose, teeth    PATIENT STATES "HAS 6 TEETH, SOME ARE LOOSE"  . Neuromuscular disorder ([place])    RIGHT HAND AND FINGERS NUMB  . Shortness of breath dyspnea     Patient Active Problem List   Diagnosis Date Noted  . Hypoglycemia 09/13/2015  . Mass of anus 11/01/2014    Past Surgical History:  Procedure Laterality Date  . COLONOSCOPY WITH PROPOFOL N/A 11/19/2014   Procedure: COLONOSCOPY WITH PROPOFOL;  Surgeon: Lucilla Lame, MD;  Location: [place];  Service: Endoscopy;  Laterality: N/A;  DIABETIC-ORAL MEDS  . RECTAL BIOPSY N/A 11/02/2014   Procedure: BIOPSY RECTAL;  Surgeon: Marlyce Huge, MD;  Location: [facility];  Service: General;  Laterality: N/A;  . RECTAL EXAM UNDER ANESTHESIA N/A 11/02/2014   Procedure: RECTAL EXAM UNDER ANESTHESIA;  Surgeon: Marlyce Huge, MD;  Location: [facility];  Service: General;  Laterality: N/A;  . TONSILLECTOMY      Prior to Admission medications   Medication  Sig Start Date End Date Taking? Authorizing Provider  aspirin EC 81 MG tablet Take 81 mg by mouth daily.    Historical Provider, MD  atorvastatin (LIPITOR) 40 MG tablet Take 40 mg by mouth at bedtime.    Historical Provider, MD  ferrous sulfate 325 (65 FE) MG tablet Take 325 mg by mouth at bedtime.    Historical Provider, MD  hydrOXYzine (ATARAX/VISTARIL) 25 MG tablet Take 25 mg by mouth 3 (three) times daily as needed.    Historical Provider, MD  lisinopril (PRINIVIL,ZESTRIL) 40 MG tablet Take 40 mg by mouth daily.    Historical Provider, MD  metFORMIN (GLUCOPHAGE) 1000 MG tablet Take 1,000 mg by mouth 2 (two) times daily with a meal.    Historical Provider, MD  triamcinolone ointment (KENALOG) 0.1 % Apply 1 application topically 3 (three) times daily as needed.    Historical Provider, MD    Allergies Review of patient's allergies indicates no known allergies.  Family History  Problem Relation Age of Onset  . Asthma Mother     Social History Social History  Substance Use Topics  . Smoking status: Current Every Day Smoker    Packs/day: 0.25    Years: 20.00    Types: Cigarettes  . Smokeless tobacco: Never Used  . Alcohol use Yes     Comment: States that he drinks 1 quart beer. daily (No ETOH in last 3 days)    Review of Systems Constitutional: No fever/chills Eyes:  No visual changes. ENT: No sore throat. Cardiovascular: Denies chest pain. Respiratory: as above Gastrointestinal: No abdominal pain.  No nausea, no vomiting.  No diarrhea.  No constipation. Genitourinary: Negative for dysuria. Musculoskeletal: Negative for back pain. Skin: Negative for rash. Neurological: Negative for headaches, focal weakness or numbness.  10-point ROS otherwise negative.  ____________________________________________   PHYSICAL EXAM:  VITAL SIGNS: ED Triage Vitals  Enc Vitals Group     BP 01/24/16 1023 (!) 206/97     Pulse Rate 01/24/16 1023 92     Resp 01/24/16 1023 18     Temp  01/24/16 1023 (!) 95.1 F (35.1 C)     Temp Source 01/24/16 1023 Axillary     SpO2 01/24/16 1023 97 %     Weight 01/24/16 1024 169 lb (76.7 kg)     Height --      Head Circumference --      Peak Flow --      Pain Score 01/24/16 1024 0     Pain Loc --      Pain Edu? --      Excl. in [place]? --     Constitutional: Alert and oriented. Well appearing and in no acute distress. Eyes: Pale sclera. PERRL. EOMI. Head: Atraumatic. Nose: No congestion/rhinnorhea. Mouth/Throat: Mucous membranes are moist.  Neck: No stridor.   Cardiovascular: Normal rate, regular rhythm. Grossly normal heart sounds.   Respiratory: Normal respiratory effort.  No retractions. Lungs CTAB. Gastrointestinal: Soft and nontender. No distention. No CVA tenderness.  Brown stool which is heme-negative. Musculoskeletal: No lower extremity tenderness nor edema.  No joint effusions. Neurologic:  Normal speech and language. No gross focal neurologic deficits are appreciated.  Skin:  Skin is warm, dry and intact. No rash noted.  Mass to the right buttock inside the gluteal fold which is about 3 x 4 cm with mucosal tissue exposed. It has a regular borders. There is no bleeding. No surrounding erythema or induration.  Psychiatric: Mood and affect are normal. Speech and behavior are normal.  ____________________________________________   LABS (all labs ordered are listed, but only abnormal results are displayed)  Labs Reviewed  CBC WITH DIFFERENTIAL/PLATELET - Abnormal; Notable for the following:       Result Value   RBC 3.64 (*)    Hemoglobin 6.9 (*)    HCT 23.0 (*)    MCV 63.3 (*)    MCH 18.9 (*)    MCHC 29.9 (*)    RDW 17.3 (*)    All other components within normal limits  BASIC METABOLIC PANEL - Abnormal; Notable for the following:    Sodium 130 (*)    Glucose, Bld 204 (*)    Creatinine, Ser 1.31 (*)    Calcium 8.1 (*)    GFR calc non Af Amer 56 (*)    Anion gap 4 (*)    All other components within normal limits    TYPE AND SCREEN   ____________________________________________  EKG   ____________________________________________  RADIOLOGY   ____________________________________________   PROCEDURES  Procedure(s) performed:  Procedures  Critical Care performed:   ____________________________________________   INITIAL IMPRESSION / ASSESSMENT AND PLAN / ED COURSE  Pertinent labs & imaging results that were available during my care of the patient were reviewed by me and considered in my medical decision making (see chart for details).  Patient with hemoglobin of 6.4 as an outpatient and 6.9 here. Before yesterday, his last hemoglobin was 9.2. Plan is to admit for symptomatic anemia. Will  also relay the information about the growth on his buttocks to the admitting doctor. I'm suspecting malignant growth as the cause of this lesion.  Clinical Course   ----------------------------------------- 1:01 PM on 01/24/2016 -----------------------------------------  Patient to be admitted for symptomatic anemia. Signed out to Dr. Bridgett Larsson. I updated the patient as well as his family member as to the lab results as well as the care plan and they are understanding and willingness to comply.  ____________________________________________   FINAL CLINICAL IMPRESSION(S) / ED DIAGNOSES  Mass on the buttock. Symptomatic anemia.    NEW MEDICATIONS STARTED DURING THIS VISIT:  New Prescriptions   No medications on file     Note:  This document was prepared using Dragon voice recognition software and may include unintentional dictation errors.    Orbie Pyo, MD 01/24/16 [id number]

## 2016-01-24 NOTE — H&P (Signed)
[facility] [place] NAME: [name]    MR#:  [id number]  DATE OF BIRTH:  04-Jan-1951  DATE OF ADMISSION:  01/24/2016  PRIMARY CARE PHYSICIAN: [name], MD   REQUESTING/REFERRING PHYSICIAN: dr.  CHIEF COMPLAINT: Anemia    Chief Complaint  Patient presents with  . Other    HISTORY OF PRESENT ILLNESS:  [name]  is a 65 y.o. 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 Patient hemoglobin drawn an outpatient was 6.4. Patient has been having dizziness, weakness for several days. No chest pain. Because of low hemoglobin patient is sent here. Patient denies any black stools, vomiting of blood.  scheduled to have lymph node biopsy by Dr. Adonis Huguenin 19th of October. She has a history of iron deficiency anemia, history of multiple transfusions before. Never had EGD or colonoscopy last year showed internal hemorrhoids. And did not see any oncologist  Before, PAST MEDICAL. Now, diabetes mellitus type 2, HISTORY:   Past Medical History:  Diagnosis Date  . CHF (congestive heart failure) ([place])   . Diabetes mellitus    Patient take Metformin.  Marland Kitchen Hypertension   . Leg swelling   . Loose, teeth    PATIENT STATES "HAS 6 TEETH, SOME ARE LOOSE"  . Neuromuscular disorder ([place])    RIGHT HAND AND FINGERS NUMB  . Shortness of breath dyspnea     PAST SURGICAL HISTOIRY:   Past Surgical History:  Procedure Laterality Date  . COLONOSCOPY WITH PROPOFOL N/A 11/19/2014   Procedure: COLONOSCOPY WITH PROPOFOL;  Surgeon: Lucilla Lame, MD;  Location: [facility];  Service: Endoscopy;  Laterality: N/A;  DIABETIC-ORAL MEDS  . RECTAL BIOPSY N/A 11/02/2014   Procedure: BIOPSY RECTAL;  Surgeon: Marlyce Huge, MD;  Location: [facility];  Service: General;  Laterality: N/A;  . RECTAL EXAM UNDER ANESTHESIA N/A  11/02/2014   Procedure: RECTAL EXAM UNDER ANESTHESIA;  Surgeon: Marlyce Huge, MD;  Location: [facility];  Service: General;  Laterality: N/A;  . TONSILLECTOMY      SOCIAL HISTORY:   Social History  Substance Use Topics  . Smoking status: Current Every Day Smoker    Packs/day: 0.25    Years: 20.00    Types: Cigarettes  . Smokeless tobacco: Never Used  . Alcohol use Yes     Comment: States that he drinks 1 quart beer. daily (No ETOH in last 3 days)    FAMILY HISTORY:   Family History  Problem Relation Age of Onset  . Asthma Mother     DRUG ALLERGIES:  No Known Allergies  REVIEW OF SYSTEMS:  CONSTITUTIONAL; some fatigue. EYES: No blurred or double vision.  EARS, NOSE, AND THROAT: No tinnitus or ear pain.  RESPIRATORY: No cough, shortness of breath, wheezing or hemoptysis.  CARDIOVASCULAR: No chest pain, orthopnea, edema.  GASTROINTESTINAL: No nausea, vomiting, diarrhea or abdominal pain.  GENITOURINARY: No dysuria, hematuria.  ENDOCRINE: No polyuria, nocturia,  HEMATOLOGY:  Anemia sKIN: No rash or lesion. MUSCULOSKELETAL: No joint pain or arthritis.   NEUROLOGIC: No tingling, numbness, weakness.  PSYCHIATRY: No anxiety or depression.   MEDICATIONS AT HOME:   Prior to Admission medications   Medication Sig Start Date End Date Taking? Authorizing Provider  aspirin EC 81 MG tablet Take 81 mg by mouth daily.   Yes Historical Provider, MD  atorvastatin (LIPITOR) 40 MG tablet Take  40 mg by mouth at bedtime.   Yes Historical Provider, MD  ferrous sulfate 325 (65 FE) MG tablet Take 325 mg by mouth at bedtime.   Yes Historical Provider, MD  hydrOXYzine (ATARAX/VISTARIL) 25 MG tablet Take 25 mg by mouth 3 (three) times daily as needed.   Yes Historical Provider, MD  lisinopril (PRINIVIL,ZESTRIL) 40 MG tablet Take 40 mg by mouth daily.   Yes Historical Provider, MD  metFORMIN (GLUCOPHAGE) 1000 MG tablet Take 1,000 mg by mouth 2 (two) times daily with a meal.   Yes  Historical Provider, MD  triamcinolone ointment (KENALOG) 0.1 % Apply 1 application topically 3 (three) times daily as needed.   Yes Historical Provider, MD      VITAL SIGNS:  Blood pressure (!) 168/103, pulse 86, temperature (!) 95.1 F (35.1 C), temperature source Axillary, resp. rate 18, weight 76.7 kg (169 lb), SpO2 100 %.  PHYSICAL EXAMINATION:  GENERAL:  65 y.o.-year-old patient lying in the bed with no acute distress.  EYES: Pupils equal, round, reactive to light and accommodation. No scleral icterus. Extraocular muscles intact.  HEENT: Head atraumatic, normocephalic. Oropharynx and nasopharynx clear.  NECK:  Supple, no jugular venous distention. No thyroid enlargement, no tenderness.  LUNGS: Normal breath sounds bilaterally, no wheezing, rales,rhonchi or crepitation. No use of accessory muscles of respiration.  CARDIOVASCULAR: S1, S2 normal. No murmurs, rubs, or gallops.  ABDOMEN: Soft, nontender, nondistended. Bowel sounds present. No organomegaly or mass.  noted to have had discolored skin within 1 cm of anus on the right side. stool guaiacs are negative.  EXTREMITIES: No pedal edema, cyanosis, or clubbing.  noted to have bilateral palpable lymphadenopathy in bilateral groins. NEUROLOGIC: Cranial nerves II through XII are intact. Muscle strength 5/5 in all extremities. Sensation intact. Gait not checked.  PSYCHIATRIC: The patient is alert and oriented x 3.  SKIN: No obvious rash, lesion, or ulcer.   LABORATORY PANEL:   CBC  Recent Labs Lab 01/24/16 1047  WBC 6.6  HGB 6.9*  HCT 23.0*  PLT 269   ------------------------------------------------------------------------------------------------------------------  Chemistries   Recent Labs Lab 01/24/16 1047  NA 130*  K 4.4  CL 101  CO2 25  GLUCOSE 204*  BUN 17  CREATININE 1.31*  CALCIUM 8.1*    ------------------------------------------------------------------------------------------------------------------  Cardiac Enzymes No results for input(s): TROPONINI in the last 168 hours. ------------------------------------------------------------------------------------------------------------------  RADIOLOGY:  Dg Chest 2 View  Result Date: 01/23/2016 CLINICAL DATA:  Acute onset of mild shortness of breath. Initial encounter. EXAM: CHEST  2 VIEW COMPARISON:  Chest radiograph performed 11/11/2014 FINDINGS: The lungs are well-aerated. Vascular congestion is noted. Increased interstitial markings may reflect mild interstitial edema. Mild left basilar atelectasis is noted. Trace fluid is suggested tracking along the minor fissures. No pneumothorax is seen. The heart is borderline normal in size. No acute osseous abnormalities are seen. IMPRESSION: Vascular congestion noted. Trace fluid tracking along the minor fissures. Increased interstitial markings may reflect mild interstitial edema. Mild left basilar atelectasis noted. Electronically Signed   By: Garald Balding M.D.   On: 01/23/2016 16:40    EKG:   Orders placed or performed during the hospital encounter of 01/23/16  . EKG 12 lead  . EKG 12 lead    IMPRESSION AND PLAN:   #1 acute symptomatic anemia: Likely secondary to biopsy-proven, sqaumous cell cancer of the buttocks: Admitted to hospitalist service for blood transfusion, consult surgery, transfer the patient to surgical service after the anemia is corrected. #2 bilateral inguinal lymphadenopathy patient supposed to  have excisional biopsy in the operating room on 19th of October but because he is admitted for anemia patient will be seen by surgery today.   #3. Diabetes mellitus type 2: Patient is on metformin: Continue that, add SSIs with coverage #4 history of essential hypertension: At present is elevated today because patient did not take morning BP medication. Added  hydralazine in addition to lisinopril. #5 history of tobacco abuse smokes about 1 pack of cigarettes every 3 to 4 days. Advised to quit smoking. No wheezing today. Use of Combivent inhalers as needed. Smoking cessation counseling done for 10 minutes.    All the records are reviewed and case discussed with ED provider. Management plans discussed with the patient, family and they are in agreement.  CODE STATUS: full  TOTAL TIME TAKING CARE OF THIS PATIENT: 5minutes.    Epifanio Lesches M.D on 01/24/2016 at 3:14 PM  Between 7am to 6pm - Pager - [phone]  After 6pm go to [email or web address] - password [email or web address] [facility]  Office  [phone]  CC: Primary care physician; [name], MD  Note: This dictation was prepared with Dragon dictation along with smaller phrase technology. Any transcriptional errors that result from this process are unintentional.

## 2016-01-24 NOTE — Telephone Encounter (Signed)
Spoke with Dr. Adonis Huguenin. He would like to leave the patient on the OR schedule for tomorrow as this may be source of patient's bleeding. Angie (surgery scheduler), pre-admit Tomi Bamberger)  and OR made aware.

## 2016-01-24 NOTE — Telephone Encounter (Signed)
Received Pre-op labs for patient. Hemoglobin is 6.4. Reviewed labs with Dr. Dahlia Byes and he advised that patient needs to go to the Emergency Room and admitted to receive a transfusion. I will keep the patient on the OR schedule until I speak with Dr. Adonis Huguenin as Cancer on buttocks may be source of bleeding.  Patient denies blood in his stools, heartburn, or excessive bleeding from buttocks cancer.

## 2016-01-24 NOTE — Pre-Procedure Instructions (Signed)
cxr faxed to dr n Humphrey Rolls as part of medical clearance requested 01/23/16 for abn labs. Also notrified maritza at dr [employer]

## 2016-01-24 NOTE — Telephone Encounter (Signed)
Notification sent to Dr. Adonis Huguenin at this time.

## 2016-01-24 NOTE — ED Triage Notes (Signed)
Pt to ed with c/o abnormal labs. Pt states he had labs drawn yesterday and found out blood count was low.  Pt reports weakness.

## 2016-01-24 NOTE — Pre-Procedure Instructions (Signed)
Spoke with [name] at Dr. Reginal Lutes office regarding pt being in hospital.  Dr. Adonis Huguenin is aware, pt is to receive blood prior to surgery tomorrow.

## 2016-01-24 NOTE — Progress Notes (Signed)
Notified attending Md about patient elevated blood pressure, not decreased with oral blood pressure medication. Received new one time order for IV hydralazine 20mg . Given to patient. Continue to assess.

## 2016-01-25 DIAGNOSIS — D649 Anemia, unspecified: Secondary | ICD-10-CM | POA: Diagnosis not present

## 2016-01-25 DIAGNOSIS — R229 Localized swelling, mass and lump, unspecified: Secondary | ICD-10-CM

## 2016-01-25 DIAGNOSIS — R590 Localized enlarged lymph nodes: Secondary | ICD-10-CM | POA: Diagnosis not present

## 2016-01-25 DIAGNOSIS — D500 Iron deficiency anemia secondary to blood loss (chronic): Secondary | ICD-10-CM | POA: Diagnosis not present

## 2016-01-25 LAB — CBC
HEMATOCRIT: 21.8 % — AB (ref 40.0–52.0)
HEMOGLOBIN: 6.7 g/dL — AB (ref 13.0–18.0)
MCH: 19.8 pg — ABNORMAL LOW (ref 26.0–34.0)
MCHC: 30.8 g/dL — ABNORMAL LOW (ref 32.0–36.0)
MCV: 64.2 fL — ABNORMAL LOW (ref 80.0–100.0)
Platelets: 229 10*3/uL (ref 150–440)
RBC: 3.4 MIL/uL — AB (ref 4.40–5.90)
RDW: 19.3 % — ABNORMAL HIGH (ref 11.5–14.5)
WBC: 6.2 10*3/uL (ref 3.8–10.6)

## 2016-01-25 LAB — GLUCOSE, CAPILLARY
GLUCOSE-CAPILLARY: 116 mg/dL — AB (ref 65–99)
GLUCOSE-CAPILLARY: 116 mg/dL — AB (ref 65–99)
Glucose-Capillary: 130 mg/dL — ABNORMAL HIGH (ref 65–99)
Glucose-Capillary: 112 mg/dL — ABNORMAL HIGH (ref 65–99)

## 2016-01-25 LAB — BASIC METABOLIC PANEL
ANION GAP: 4 — AB (ref 5–15)
BUN: 16 mg/dL (ref 6–20)
CALCIUM: 7.9 mg/dL — AB (ref 8.9–10.3)
CO2: 23 mmol/L (ref 22–32)
Chloride: 104 mmol/L (ref 101–111)
Creatinine, Ser: 1.12 mg/dL (ref 0.61–1.24)
Glucose, Bld: 119 mg/dL — ABNORMAL HIGH (ref 65–99)
POTASSIUM: 4 mmol/L (ref 3.5–5.1)
Sodium: 131 mmol/L — ABNORMAL LOW (ref 135–145)

## 2016-01-25 LAB — IRON AND TIBC
Iron: 193 ug/dL — ABNORMAL HIGH (ref 45–182)
SATURATION RATIOS: 58 % — AB (ref 17.9–39.5)
TIBC: 331 ug/dL (ref 250–450)
UIBC: 138 ug/dL

## 2016-01-25 LAB — FERRITIN: FERRITIN: 13 ng/mL — AB (ref 24–336)

## 2016-01-25 LAB — RETICULOCYTES
RBC.: 3.91 MIL/uL — ABNORMAL LOW (ref 4.40–5.90)
RETIC CT PCT: 1.8 % (ref 0.4–3.1)
Retic Count, Absolute: 70.4 10*3/uL (ref 19.0–183.0)

## 2016-01-25 LAB — HEMOGLOBIN AND HEMATOCRIT, BLOOD
HCT: 25.2 % — ABNORMAL LOW (ref 40.0–52.0)
Hemoglobin: 8 g/dL — ABNORMAL LOW (ref 13.0–18.0)

## 2016-01-25 MED ORDER — POLYETHYLENE GLYCOL 3350 17 G PO PACK
17.0000 g | PACK | Freq: Every day | ORAL | Status: DC
  Administered 2016-01-25: 17 g via ORAL
  Filled 2016-01-25: qty 1

## 2016-01-25 MED ORDER — INSULIN ASPART 100 UNIT/ML ~~LOC~~ SOLN: 0.0000 [IU] | Freq: Every day | SUBCUTANEOUS | Status: DC

## 2016-01-25 MED ORDER — CHLORHEXIDINE GLUCONATE CLOTH 2 % EX PADS: 6.0000 | MEDICATED_PAD | Freq: Once | CUTANEOUS | Status: DC

## 2016-01-25 MED ORDER — INSULIN ASPART 100 UNIT/ML ~~LOC~~ SOLN
0.0000 [IU] | Freq: Three times a day (TID) | SUBCUTANEOUS | Status: DC
Start: 2016-01-25 — End: 2016-01-26
  Administered 2016-01-26: 1 [IU] via SUBCUTANEOUS
  Filled 2016-01-25: qty 1

## 2016-01-25 MED ORDER — SODIUM CHLORIDE 0.9 % IV SOLN
Freq: Once | INTRAVENOUS | Status: AC
  Administered 2016-01-25: 07:00:00 via INTRAVENOUS

## 2016-01-25 NOTE — Progress Notes (Signed)
Nutrition Brief Note  Patient identified on the Malnutrition Screening Tool (MST) Report  Wt Readings from Last 15 Encounters:  01/25/16 169 lb 11.2 oz (77 kg)  01/23/16 169 lb (76.7 kg)  01/19/16 169 lb (76.7 kg)  01/02/16 184 lb (83.5 kg)  09/13/15 165 lb (74.8 kg)  11/25/14 173 lb (78.5 kg)  11/19/14 175 lb (79.4 kg)  11/12/14 175 lb 1.6 oz (79.4 kg)  11/11/14 187 lb (84.8 kg)  11/01/14 155 lb (70.3 kg)  08/30/14 150 lb (68 kg)  08/20/14 150 lb (68 kg)   Patient reports his appetite is good and has been PTA. He eats at least 3 meals daily and always finishes 100%. Denies N/V, abdominal pain, or difficulty chewing swallowing. Reports UBW is 167 lbs and that he may have been gaining some weight recently. Encouraged patient to continue adequate intake of calories and protein with meals, snacks, and beverages.   Body mass index is 24.7 kg/m. Patient meets criteria for Normal Weight based on current BMI.   Current diet order is Heart Healthy/CHO Modified, patient is consuming approximately 100% of meals at this time. Labs and medications reviewed.   No nutrition interventions warranted at this time. If nutrition issues arise, please consult RD.   Willey Blade, MS, RD, LDN Pager: [phone] After Hours Pager: [phone]

## 2016-01-25 NOTE — Progress Notes (Signed)
This a patient with biopsy-proven squamous cell carcinoma the perianal area who has multiple groin nodes area and he has been scheduled for elective surgery by Dr. Adonis Huguenin for tomorrow. He was transfused yesterday. He has no complaints at this time is not dizzy nor short of breath.  Vital signs are reviewed  Patient is been transfused last night his labs of been reviewed. And I have discussed the patient with Dr. Adonis Huguenin who is planning right inguinal node biopsy tomorrow. I have discussed with the patient the rationale for this surgery the options of observation the risks of bleeding infection recurrence negative biopsy and cosmetic deformity he understood and agreed to proceed. Dr. Adonis Huguenin is planning this for tomorrow.

## 2016-01-25 NOTE — Progress Notes (Addendum)
Okay per Dr. Lavetta Nielsen to place order for wound care consult for wounds on toes

## 2016-01-25 NOTE — Progress Notes (Signed)
Notified Dr. Burt Knack that patient did receive a dose of Metformin this morning as it had been ordered back as a home meds. On his outpatient procedure information it stated not to take metformin 2 days prior to the procedure. RN was unaware of this at the time that she gave the medication. MD stated that this should be fine

## 2016-01-25 NOTE — Progress Notes (Signed)
[facility] [place] NAME: [name]    MRN#:  [id number]  DATE OF BIRTH:  Apr 17, 1950  SUBJECTIVE:  Hospital Day: 1 day [name] is a 65 y.o. male presenting with Weakness.   Overnight events: No acute overnight events received blood transfusion overnight Interval Events: No complaints this morning denies any GI bleeding or further symptoms  REVIEW OF SYSTEMS:  CONSTITUTIONAL: No fever, fatigue or weakness.  EYES: No blurred or double vision.  EARS, NOSE, AND THROAT: No tinnitus or ear pain.  RESPIRATORY: No cough, shortness of breath, wheezing or hemoptysis.  CARDIOVASCULAR: No chest pain, orthopnea, edema.  GASTROINTESTINAL: No nausea, vomiting, diarrhea or abdominal pain.  GENITOURINARY: No dysuria, hematuria.  ENDOCRINE: No polyuria, nocturia,  HEMATOLOGY: No anemia, easy bruising or bleeding SKIN: No rash or lesion. MUSCULOSKELETAL: No joint pain or arthritis.   NEUROLOGIC: No tingling, numbness, weakness.  PSYCHIATRY: No anxiety or depression.   DRUG ALLERGIES:  No Known Allergies  VITALS:  Blood pressure (!) 148/80, pulse 84, temperature 98.4 F (36.9 C), temperature source Oral, resp. rate 18, height 5' 9.5" (1.765 m), weight 77 kg (169 lb 11.2 oz), SpO2 100 %.  PHYSICAL EXAMINATION:  VITAL SIGNS: Vitals:   01/25/16 1047 01/25/16 1207  BP: (!) 160/86 (!) 148/80  Pulse: 86 84  Resp: 18 18  Temp: 98.3 F (36.8 C) 98.4 F (36.9 C)   GENERAL:65 y.o.male currently in no acute distress.  HEAD: Normocephalic, atraumatic.  EYES: Pupils equal, round, reactive to light. Extraocular muscles intact. No scleral icterus.  MOUTH: Moist mucosal membrane. Dentition intact. No abscess noted.  EAR, NOSE, THROAT: Clear without exudates. No external lesions.  NECK: Supple. No thyromegaly. No nodules. No JVD.  PULMONARY: Clear to ascultation, without wheeze rails or rhonci. No use of accessory muscles, Good respiratory  effort. good air entry bilaterally CHEST: Nontender to palpation.  CARDIOVASCULAR: S1 and S2. Regular rate and rhythm. No murmurs, rubs, or gallops. No edema. Pedal pulses 2+ bilaterally.  GASTROINTESTINAL: Soft, nontender, nondistended. No masses. Positive bowel sounds. No hepatosplenomegaly.  MUSCULOSKELETAL: No swelling, clubbing, or edema. Range of motion full in all extremities.  NEUROLOGIC: Cranial nerves II through XII are intact. No gross focal neurological deficits. Sensation intact. Reflexes intact.  SKIN: Purple/pink lesion with evidence of oozing blood near rectum otherwise No ulceration, lesions, rashes, or cyanosis. Skin warm and dry. Turgor intact.  PSYCHIATRIC: Mood, affect within normal limits. The patient is awake, alert and oriented x 3. Insight, judgment intact.      LABORATORY PANEL:   CBC  Recent Labs Lab 01/25/16 0445 01/25/16 1154  WBC 6.2  --   HGB 6.7* 8.0*  HCT 21.8* 25.2*  PLT 229  --    ------------------------------------------------------------------------------------------------------------------  Chemistries   Recent Labs Lab 01/25/16 0445  NA 131*  K 4.0  CL 104  CO2 23  GLUCOSE 119*  BUN 16  CREATININE 1.12  CALCIUM 7.9*   ------------------------------------------------------------------------------------------------------------------  Cardiac Enzymes No results for input(s): TROPONINI in the last 168 hours. ------------------------------------------------------------------------------------------------------------------  RADIOLOGY:  Dg Chest 2 View  Result Date: 01/23/2016 CLINICAL DATA:  Acute onset of mild shortness of breath. Initial encounter. EXAM: CHEST  2 VIEW COMPARISON:  Chest radiograph performed 11/11/2014 FINDINGS: The lungs are well-aerated. Vascular congestion is noted. Increased interstitial markings may reflect mild interstitial edema. Mild left basilar atelectasis is noted. Trace fluid is suggested tracking along  the minor fissures. No pneumothorax is seen. The heart is  borderline normal in size. No acute osseous abnormalities are seen. IMPRESSION: Vascular congestion noted. Trace fluid tracking along the minor fissures. Increased interstitial markings may reflect mild interstitial edema. Mild left basilar atelectasis noted. Electronically Signed   By: Garald Balding M.D.   On: 01/23/2016 16:40    EKG:   Orders placed or performed during the hospital encounter of 01/23/16  . EKG 12 lead  . EKG 12 lead    ASSESSMENT AND PLAN:   [name] is a 65 y.o. male presenting with Other . Admitted 01/24/2016 : Day #: 1 day 1. Symptomatic anemia status post 1 unit packed red blood cell transfusion, receiving 1 further units currently Microcytic anemia likely secondary to chronic blood loss will add iron studies to previously drawn blood however, I am uncertain if we can add this to blood work performed prior to transfusion 2. Squamous cell carcinoma rectum: Care per surgery 3. Constipation: Initiate bowel regimen 4. Type 2 diabetes non-insulin-requiring hold oral agents add sliding scale coverage 5. Essential hypertension lisinopril  All the records are reviewed and case discussed with Care Management/Social Workerr. Management plans discussed with the patient, family and they are in agreement.  CODE STATUS: full TOTAL TIME TAKING CARE OF THIS PATIENT: 33 minutes.   POSSIBLE D/C IN 2-3DAYS, DEPENDING ON CLINICAL CONDITION.   Charda Janis,  Karenann Cai.D on 01/25/2016 at 1:43 PM  Between 7am to 6pm - Pager - [phone]  After 6pm: House Pager: - [facility]  Office  [phone]  CC: Primary care physician; [name], MD

## 2016-01-25 NOTE — Progress Notes (Signed)
Called Dr. Estanislado Pandy regarding patient's Hgb of 6.7.  Appropriate orders were placed.  Christene Slates  01/25/2016  5:43 AM

## 2016-01-25 NOTE — Progress Notes (Signed)
Notified Dr. Lavetta Nielsen that the patient had a 20 point increase in BP by the end of his transfusion. The patient has a history of hypertension and has already been given his hypertension medication this morning

## 2016-01-26 ENCOUNTER — Inpatient Hospital Stay: Payer: Medicare Other | Admitting: Registered Nurse

## 2016-01-26 ENCOUNTER — Encounter
Admission: EM | Disposition: A | Discharge status: Home / Self Care | Payer: Self-pay | Source: Home / Self Care | Attending: Internal Medicine

## 2016-01-26 ENCOUNTER — Encounter: Payer: Self-pay | Admitting: *Deleted

## 2016-01-26 ENCOUNTER — Ambulatory Visit: Admission: RE | Admit: 2016-01-26 | Payer: Medicare HMO | Source: Ambulatory Visit | Admitting: General Surgery

## 2016-01-26 DIAGNOSIS — D500 Iron deficiency anemia secondary to blood loss (chronic): Secondary | ICD-10-CM | POA: Diagnosis not present

## 2016-01-26 DIAGNOSIS — R590 Localized enlarged lymph nodes: Secondary | ICD-10-CM | POA: Diagnosis not present

## 2016-01-26 DIAGNOSIS — D649 Anemia, unspecified: Secondary | ICD-10-CM | POA: Diagnosis not present

## 2016-01-26 HISTORY — PX: INGUINAL LYMPH NODE BIOPSY: SHX5865

## 2016-01-26 LAB — CBC WITH DIFFERENTIAL/PLATELET
BASOS PCT: 1 %
Basophils Absolute: 0.1 10*3/uL (ref 0–0.1)
EOS ABS: 0.4 10*3/uL (ref 0–0.7)
EOS PCT: 6 %
HCT: 26.4 % — ABNORMAL LOW (ref 40.0–52.0)
HEMOGLOBIN: 8.1 g/dL — AB (ref 13.0–18.0)
Lymphocytes Relative: 21 %
Lymphs Abs: 1.6 10*3/uL (ref 1.0–3.6)
MCH: 20.3 pg — ABNORMAL LOW (ref 26.0–34.0)
MCHC: 30.7 g/dL — AB (ref 32.0–36.0)
MCV: 66.3 fL — ABNORMAL LOW (ref 80.0–100.0)
MONO ABS: 1 10*3/uL (ref 0.2–1.0)
MONOS PCT: 14 %
NEUTROS PCT: 58 %
Neutro Abs: 4.3 10*3/uL (ref 1.4–6.5)
PLATELETS: 248 10*3/uL (ref 150–440)
RBC: 3.98 MIL/uL — ABNORMAL LOW (ref 4.40–5.90)
RDW: 20.6 % — AB (ref 11.5–14.5)
WBC: 7.4 10*3/uL (ref 3.8–10.6)

## 2016-01-26 LAB — GLUCOSE, CAPILLARY
GLUCOSE-CAPILLARY: 124 mg/dL — AB (ref 65–99)
GLUCOSE-CAPILLARY: 111 mg/dL — AB (ref 65–99)
Glucose-Capillary: 108 mg/dL — ABNORMAL HIGH (ref 65–99)

## 2016-01-26 LAB — BASIC METABOLIC PANEL
Anion gap: 4 — ABNORMAL LOW (ref 5–15)
BUN: 16 mg/dL (ref 6–20)
CO2: 24 mmol/L (ref 22–32)
CREATININE: 1.08 mg/dL (ref 0.61–1.24)
Calcium: 8 mg/dL — ABNORMAL LOW (ref 8.9–10.3)
Chloride: 105 mmol/L (ref 101–111)
Glucose, Bld: 127 mg/dL — ABNORMAL HIGH (ref 65–99)
Potassium: 4.1 mmol/L (ref 3.5–5.1)
SODIUM: 133 mmol/L — AB (ref 135–145)

## 2016-01-26 LAB — HEMOGLOBIN A1C
Hgb A1c MFr Bld: 6.1 % — ABNORMAL HIGH (ref 4.8–5.6)
MEAN PLASMA GLUCOSE: 128 mg/dL

## 2016-01-26 SURGERY — BIOPSY, LYMPH NODE, INGUINAL, OPEN
Anesthesia: Monitor Anesthesia Care | Site: Inguinal | Laterality: Right | Wound class: Clean Contaminated

## 2016-01-26 MED ORDER — FERROUS SULFATE 325 (65 FE) MG PO TABS: 325.0000 mg | ORAL_TABLET | Freq: Two times a day (BID) | ORAL | 0 refills | Status: DC

## 2016-01-26 MED ORDER — MIDAZOLAM HCL 2 MG/2ML IJ SOLN
INTRAMUSCULAR | Status: DC | PRN
  Administered 2016-01-26: 2 mg via INTRAVENOUS

## 2016-01-26 MED ORDER — HYDRALAZINE HCL 20 MG/ML IJ SOLN
10.0000 mg | Freq: Four times a day (QID) | INTRAMUSCULAR | Status: DC | PRN
  Administered 2016-01-26: 10 mg via INTRAVENOUS
  Filled 2016-01-26: qty 1

## 2016-01-26 MED ORDER — ONDANSETRON HCL 4 MG/2ML IJ SOLN: 4.0000 mg | Freq: Once | INTRAMUSCULAR | Status: DC | PRN

## 2016-01-26 MED ORDER — LIDOCAINE HCL (CARDIAC) 20 MG/ML IV SOLN
INTRAVENOUS | Status: DC | PRN
  Administered 2016-01-26: 30 mg via INTRAVENOUS

## 2016-01-26 MED ORDER — PHENYLEPHRINE HCL 10 MG/ML IJ SOLN
INTRAMUSCULAR | Status: DC | PRN
  Administered 2016-01-26 (×3): 50 ug via INTRAVENOUS

## 2016-01-26 MED ORDER — PROPOFOL 10 MG/ML IV BOLUS
INTRAVENOUS | Status: DC | PRN
  Administered 2016-01-26: 20 mg via INTRAVENOUS

## 2016-01-26 MED ORDER — CEFAZOLIN SODIUM-DEXTROSE 2-4 GM/100ML-% IV SOLN
2.0000 g | INTRAVENOUS | Status: AC
  Administered 2016-01-26: 2 g via INTRAVENOUS
  Filled 2016-01-26: qty 100

## 2016-01-26 MED ORDER — HYDROCODONE-ACETAMINOPHEN 5-325 MG PO TABS: 1.0000 | ORAL_TABLET | Freq: Four times a day (QID) | ORAL | 0 refills | Status: DC | PRN

## 2016-01-26 MED ORDER — FENTANYL CITRATE (PF) 100 MCG/2ML IJ SOLN: 25.0000 ug | INTRAMUSCULAR | Status: DC | PRN

## 2016-01-26 MED ORDER — FENTANYL CITRATE (PF) 100 MCG/2ML IJ SOLN
INTRAMUSCULAR | Status: DC | PRN
  Administered 2016-01-26 (×2): 50 ug via INTRAVENOUS

## 2016-01-26 MED ORDER — BUPIVACAINE HCL (PF) 0.5 % IJ SOLN
INTRAMUSCULAR | Status: DC | PRN
  Administered 2016-01-26: 15 mL

## 2016-01-26 MED ORDER — LACTATED RINGERS IV SOLN
INTRAVENOUS | Status: DC
  Administered 2016-01-26: 08:00:00 via INTRAVENOUS

## 2016-01-26 MED ORDER — LIDOCAINE HCL (PF) 1 % IJ SOLN
INTRAMUSCULAR | Status: DC | PRN
  Administered 2016-01-26: 15 mL

## 2016-01-26 MED ORDER — PROPOFOL 500 MG/50ML IV EMUL
INTRAVENOUS | Status: DC | PRN
  Administered 2016-01-26: 40 ug/kg/min via INTRAVENOUS

## 2016-01-26 MED ORDER — BUPIVACAINE HCL (PF) 0.5 % IJ SOLN
INTRAMUSCULAR | Status: AC
  Filled 2016-01-26: qty 30

## 2016-01-26 MED ORDER — AMLODIPINE BESYLATE 10 MG PO TABS: 10.0000 mg | ORAL_TABLET | Freq: Every day | ORAL | 0 refills | Status: DC

## 2016-01-26 MED ORDER — LIDOCAINE HCL (PF) 1 % IJ SOLN
INTRAMUSCULAR | Status: AC
  Filled 2016-01-26: qty 30

## 2016-01-26 SURGICAL SUPPLY — 28 items
BLADE SURG 15 STRL LF DISP TIS (BLADE) ×1 IMPLANT
BLADE SURG 15 STRL SS (BLADE) ×2
CHLORAPREP W/TINT 26ML (MISCELLANEOUS) ×3 IMPLANT
CNTNR SPEC 2.5X3XGRAD LEK (MISCELLANEOUS) ×1
CONT SPEC 4OZ STER OR WHT (MISCELLANEOUS) ×2
CONTAINER SPEC 2.5X3XGRAD LEK (MISCELLANEOUS) ×1 IMPLANT
DERMABOND ADVANCED (GAUZE/BANDAGES/DRESSINGS) ×2
DERMABOND ADVANCED .7 DNX12 (GAUZE/BANDAGES/DRESSINGS) ×1 IMPLANT
DRAPE LAPAROTOMY 100X77 ABD (DRAPES) ×3 IMPLANT
ELECT CAUTERY BLADE 6.4 (BLADE) ×3 IMPLANT
ELECT REM PT RETURN 9FT ADLT (ELECTROSURGICAL) ×3
ELECTRODE REM PT RTRN 9FT ADLT (ELECTROSURGICAL) ×1 IMPLANT
GLOVE BIO SURGEON STRL SZ7.5 (GLOVE) ×9 IMPLANT
GLOVE INDICATOR 8.0 STRL GRN (GLOVE) ×9 IMPLANT
GOWN STRL REUS W/ TWL LRG LVL3 (GOWN DISPOSABLE) ×3 IMPLANT
GOWN STRL REUS W/TWL LRG LVL3 (GOWN DISPOSABLE) ×6
KIT RM TURNOVER STRD PROC AR (KITS) ×3 IMPLANT
LABEL OR SOLS (LABEL) IMPLANT
NDL SAFETY 25GX1.5 (NEEDLE) ×3 IMPLANT
NS IRRIG 500ML POUR BTL (IV SOLUTION) ×3 IMPLANT
PACK BASIN MINOR ARMC (MISCELLANEOUS) ×3 IMPLANT
SUT MNCRL 4-0 (SUTURE) ×2
SUT MNCRL 4-0 27XMFL (SUTURE) ×1
SUT VIC AB 2-0 CT2 27 (SUTURE) ×6 IMPLANT
SUT VIC AB 3-0 SH 27 (SUTURE) ×2
SUT VIC AB 3-0 SH 27X BRD (SUTURE) ×1 IMPLANT
SUTURE MNCRL 4-0 27XMF (SUTURE) ×1 IMPLANT
SYRINGE 10CC LL (SYRINGE) ×3 IMPLANT

## 2016-01-26 NOTE — H&P (Signed)
Per telephone conversation with MD, Give IV hydralazine, advance diet order, and discharge pt home. IV hydralazine was given with a decrease in BP. PT to be D/C'd home with Norvasc prescription.

## 2016-01-26 NOTE — H&P (View-Only) (Signed)
Outpatient Surgical Follow Up  01/19/2016  [name] is an 65 y.o. male.   Chief Complaint  Patient presents with  . Follow-up    MRI Buttocks- Discuss Plan    HPI: 65 year old male returns to clinic to discuss MRI results as well as the results of the tumor board discussion. He reports that the area to his buttocks continues to be uncomfortable when he sits on it but has learned a method of keeping a bandage to decrease the discomfort. He denies any other symptoms of fevers, chills, nausea, vomiting, chest pain, shortness of breath, diarrhea, constipation. His only active complaint is that of pain in his buttocks. He is ready to do something about this and whatever is required to safely take care of him.  Past Medical History:  Diagnosis Date  . CHF (congestive heart failure) ([place])   . Diabetes mellitus    Patient take Metformin.  Marland Kitchen Hypertension   . Leg swelling   . Loose, teeth    PATIENT STATES "HAS 6 TEETH, SOME ARE LOOSE"  . Neuromuscular disorder ([place])    RIGHT HAND AND FINGERS NUMB  . Shortness of breath dyspnea     Past Surgical History:  Procedure Laterality Date  . COLONOSCOPY WITH PROPOFOL N/A 11/19/2014   Procedure: COLONOSCOPY WITH PROPOFOL;  Surgeon: Lucilla Lame, MD;  Location: [facility];  Service: Endoscopy;  Laterality: N/A;  DIABETIC-ORAL MEDS  . RECTAL BIOPSY N/A 11/02/2014   Procedure: BIOPSY RECTAL;  Surgeon: Marlyce Huge, MD;  Location: [facility];  Service: General;  Laterality: N/A;  . RECTAL EXAM UNDER ANESTHESIA N/A 11/02/2014   Procedure: RECTAL EXAM UNDER ANESTHESIA;  Surgeon: Marlyce Huge, MD;  Location: [facility];  Service: General;  Laterality: N/A;    Family History  Problem Relation Age of Onset  . Asthma Mother     Social History:  reports that he has been smoking Cigarettes.  He has a 5.00 pack-year smoking history. He has never used smokeless tobacco. He reports that he drinks alcohol. He reports that he uses  drugs, including Marijuana.  Allergies: No Known Allergies  Medications reviewed.    ROS A multipoint review of systems was completed, all pertinent positives and negatives are documented within the history of present illness and the remainder are negative   BP (!) 170/85   Pulse 90   Temp 98.5 F (36.9 C) (Oral)   Wt 76.7 kg (169 lb)   BMI 24.96 kg/m   Physical Exam  Physical exam: Gen.: No acute distress Neck: Supple and nontender Chest: Clear to auscultation Heart: Rate and rhythm Abdomen: Soft and nontender. Bilateral groins examined with palpable lymphadenopathy to bilateral groins Rectum: 4 x 6 cm area of raised, hard, discolored skin going to within 1 cm of the anus on the right side. Central ulceration again visualized but currently not bleeding. Extremities: Right lower extremity and walking boot and bilateral pedal edema.    No results found for this or any previous visit (from the past 48 hour(s)). No results found. MRI of the pelvis: IMPRESSION: 1. Bilateral inguinal and right external iliac adenopathy with mild retroperitoneal adenopathy associated with a cutaneous and immediately subcutaneous thickening/mass along the right inferior margin of the intergluteal cleft, just above the gluteal sulcus as shown on image 30/5 and image 8/6. Cutaneous infection or tumor could give this appearance, and if the appearance is verrucous, a condyloma might be considered. Cutaneous biopsy likely warranted.  Assessment/Plan:  1. 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 Patient was discussed in tumor board last week and felt due to its location he would require a colostomy in order for the wound to heal. This is a tumor that is very amenable to radiation therapy per the radiologist and oncologist. If there is no evidence of metastatic spread this could be treated primarily with radiation.  2.  Lymphadenopathy Per the MRI and exam patient has bilateral inguinal lymphadenopathy in addition to external iliac and retroperitoneal adenopathy seen on imaging. This is highly suspicious for metastatic disease. However, there was evidence of some lymphadenopathy a year ago on CT scan. Given these findings this may represent some other problem then metastatic cancer. Plan for an excisional biopsy in the operating room of the right inguinal lymphadenopathy that is palpable. We'll plan to do this next week on October 19. The procedure was described in detail the patient. He described understanding and desired to proceed.     Clayburn Pert, MD FACS General Surgeon  01/19/2016,4:03 PM

## 2016-01-26 NOTE — Progress Notes (Signed)
Visit patient this morning. He has no complaints and is ready for surgery questions were answered for him Dr. Adonis Huguenin to perform right inguinal node biopsy later today. Labs reviewed patient is nothing by mouth

## 2016-01-26 NOTE — Op Note (Signed)
   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 with endotracheal tube  Assistant: None  Procedure Details  The patient was seen again in the Holding Room. The benefits, complications, treatment options, and expected outcomes were discussed with the patient. The risks of bleeding, infection, failure to make diagnosis,  any of which could require further surgery were reviewed with the patient.   The patient was taken to Operating Room, identified as [name] and the procedure verified.  A Time Out was held and the above information confirmed.  Prior to the induction of monitored anesthesia, antibiotic prophylaxis was administered. VTE prophylaxis was in place. General endotracheal anesthesia was then administered and tolerated well. After the induction, the right lower abdomen was prepped with Chloraprep and draped in the sterile fashion. The patient was positioned in the supine position.  The palpable lymphadenopathy was again marked and then localized around this with a 50-50 mixture 1% lidocaine 0.5% Marcaine plain. Skin was incised with 15 blade scalpel and using both electrocautery was taken down to the inguinal ointment. Which point the palpable lymphadenopathy was able to fully identified. The most superficial lymph node was grasped with a 2-0 silk suture in a figure-of-eight fashion. This allowed to be elevated and completely excised with Bovie electrocautery. This lymph node was passed off the field as specimen. The area was again visually and manually inspected. An additional pathologic-appearing lymph node was able be identified grasped with a 2-0 silk suture again  and excised in the identical fashion.  Entire area was irrigated and meticulous hemostasis was ensured with direct elect cautery. Although there was additional palpable lymphadenopathy the decision was made to complete the operation at this point. The field was re-localized the aforementioned local anesthetic The deep dermal tissues were approximated with interrupted 3-0 Vicryl suture. The dermal tissues were reapproximated as well with 3-0 Vicryl suture. And a subcuticular layer of running 4-0 Monocryl was used to close the skin that was then sealed with Dermabond.  The patient tolerated the procedure well. There were no immediate, complications. All counts are correct at the end of the procedure. He was transferred to the PACU in good condition.  Findings: Right inguinal lymphadenopathy   Estimated Blood Loss: 5 mL         Drains: None         Specimens: Right inguinal lymph node 2          Complications: None                  Condition: Good   Clayburn Pert, MD, FACS

## 2016-01-26 NOTE — Anesthesia Preprocedure Evaluation (Addendum)
Anesthesia Evaluation  Patient identified by MRN, date of birth, ID band Patient awake    Reviewed: Allergy & Precautions, H&P , NPO status , Patient's Chart, lab work & pertinent test results  Airway Mallampati: II  TM Distance: >3 FB     Dental  (+) Poor Dentition   Pulmonary shortness of breath and with exertion, Current Smoker,  Long term smoker- no recent changes.   Pulmonary exam normal        Cardiovascular hypertension, Pt. on medications +CHF  Normal cardiovascular exam     Neuro/Psych  Neuromuscular disease negative psych ROS   GI/Hepatic negative GI ROS, Neg liver ROS,   Endo/Other  diabetes, Well Controlled, Type 2, Oral Hypoglycemic Agents  Renal/GU negative Renal ROS  negative genitourinary   Musculoskeletal negative musculoskeletal ROS (+)   Abdominal   Peds negative pediatric ROS (+)  Hematology negative hematology ROS (+) anemia ,   Anesthesia Other Findings   Reproductive/Obstetrics                             Anesthesia Physical  Anesthesia Plan  ASA: III  Anesthesia Plan: General   Post-op Pain Management:    Induction: Intravenous  Airway Management Planned: Nasal Cannula  Additional Equipment:   Intra-op Plan:   Post-operative Plan:   Informed Consent: I have reviewed the patients History and Physical, chart, labs and discussed the procedure including the risks, benefits and alternatives for the proposed anesthesia with the patient or authorized representative who has indicated his/her understanding and acceptance.     Plan Discussed with: CRNA  Anesthesia Plan Comments:        Anesthesia Quick Evaluation

## 2016-01-26 NOTE — Anesthesia Postprocedure Evaluation (Signed)
Anesthesia Post Note  Patient: [name]  Procedure(s) Performed: Procedure(s) (LRB): INGUINAL LYMPH NODE BIOPSY (Right)  Patient location during evaluation: PACU Anesthesia Type: MAC Level of consciousness: awake and alert and oriented Pain management: pain level controlled Vital Signs Assessment: post-procedure vital signs reviewed and stable Respiratory status: spontaneous breathing Cardiovascular status: blood pressure returned to baseline Anesthetic complications: no    Last Vitals:  Vitals:   01/26/16 1358 01/26/16 1413  BP: (!) 159/87 (!) 174/99  Pulse: 79 79  Resp: 19 18  Temp:  36.4 C    Last Pain:  Vitals:   01/26/16 1328  TempSrc:   PainSc: Asleep                 Manson Luckadoo

## 2016-01-26 NOTE — Progress Notes (Signed)
IV was removed. Discharge instructions, follow-up appointments, and prescriptions were provided to the pt. All questions answered. The pt was taken downstairs via wheelchair by staff.

## 2016-01-26 NOTE — Discharge Instructions (Addendum)
Resume diet and activity as before   Lymph Node Biopsy, Care After Refer to this sheet in the next few weeks. These instructions provide you with information on caring for yourself after your procedure. Your health care provider may also give you more specific instructions. Your treatment has been planned according to current medical practices, but problems sometimes occur. Call your health care provider if you have any problems or questions after your procedure. WHAT TO EXPECT AFTER THE PROCEDURE After your procedure, it is typical to have the following:   You may feel numbness, tingling, or pain near your surgical incision.  You may have swelling or bruising near your incision. HOME CARE INSTRUCTIONS  Avoid vigorous exercise. Ask your health care provider when you can return to your normal activities.  You may shower 24 hours after your procedure. It is okay to get your incision wet. Pat the area dry with a clean towel. Do not rub the incision because this may cause bleeding.  There are many different ways to close and cover an incision, including stitches, skin glue, and adhesive strips. Follow your health care provider's instructions on:  Incision care.  Bandage (dressing) changes and removal.  Incision closure removal.  (incision is closed with glue, do not remove glue. It will fall off in 10-14 days)  Take medicines only as directed by your health care provider.  You may resume your regular diet.  Do not have your blood pressure taken in the arm on the side of the biopsy until your health care provider says it is okay.  Keep all follow-up visits as directed by your health care provider. This is important. SEEK MEDICAL CARE IF:  Your pain medicine is not helping.  You have nausea and vomiting.  You have any new swelling, bruising, or redness.  You have chills or a fever. SEEK IMMEDIATE MEDICAL CARE IF:   You have pain that is getting worse, and your medicine is not  helping.  You have redness, swelling, or tenderness that is getting worse.  You have bleeding or drainage from your incision site.  You have vomiting that will not stop.  You have chest pain or trouble breathing.   This information is not intended to replace advice given to you by your health care provider. Make sure you discuss any questions you have with your health care provider.   Document Released: 11/08/2003 Document Revised: 04/16/2014 Document Reviewed: 05/15/2013 Elsevier Interactive Patient Education [employer].

## 2016-01-26 NOTE — Interval H&P Note (Signed)
History and Physical Interval Note:  01/26/2016 12:00 PM  [name]  has presented today for surgery, with the diagnosis of perianal mass  The various methods of treatment have been discussed with the patient and family. After consideration of risks, benefits and other options for treatment, the patient has consented to  Procedure(s): INGUINAL LYMPH NODE BIOPSY (Right) as a surgical intervention .  The patient's history has been reviewed, patient examined, he was admitted due to anemia two days before surgery but is now stable for surgery.  I have reviewed the patient's chart and labs.  Questions were answered to the patient's satisfaction.     Clayburn Pert

## 2016-01-26 NOTE — Brief Op Note (Signed)
01/24/2016 - 01/26/2016  1:20 PM  PATIENT:  [name]  65 y.o. male  PRE-OPERATIVE DIAGNOSIS:  perianal mass  POST-OPERATIVE DIAGNOSIS:  perianal mass  PROCEDURE:  Procedure(s): INGUINAL LYMPH NODE BIOPSY (Right)  SURGEON:  Surgeon(s) and Role:    * Clayburn Pert, MD - Primary  PHYSICIAN ASSISTANT:   ASSISTANTS: none   ANESTHESIA:   IV sedation  EBL:  Total I/O In: 450 [I.V.:450] Out: 300 [Urine:300]  BLOOD ADMINISTERED:none  DRAINS: none   LOCAL MEDICATIONS USED:  MARCAINE   , XYLOCAINE  and Amount: 30 ml  SPECIMEN:  Source of Specimen:  right inguinal lymph nodes x 2  DISPOSITION OF SPECIMEN:  PATHOLOGY  COUNTS:  YES  TOURNIQUET:  * No tourniquets in log *  DICTATION: .Dragon Dictation  PLAN OF CARE: return to inpatient status  PATIENT DISPOSITION:  PACU - hemodynamically stable.   Delay start of Pharmacological VTE agent (>24hrs) due to surgical blood loss or risk of bleeding: no

## 2016-01-26 NOTE — Transfer of Care (Signed)
Immediate Anesthesia Transfer of Care Note  Patient: [name]  Procedure(s) Performed: Procedure(s): INGUINAL LYMPH NODE BIOPSY (Right)  Patient Location: PACU  Anesthesia Type:MAC  Level of Consciousness: awake  Airway & Oxygen Therapy: Patient Spontanous Breathing  Post-op Assessment: Report given to RN  Post vital signs: stable  Last Vitals:  Vitals:   01/26/16 1136 01/26/16 1328  BP: (!) 172/99 130/86  Pulse:  69  Resp:  16  Temp:  36.1 C    Last Pain:  Vitals:   01/26/16 1132  TempSrc: Tympanic  PainSc: 0-No pain         Complications: No apparent anesthesia complications

## 2016-01-27 ENCOUNTER — Encounter: Payer: Self-pay | Admitting: General Surgery

## 2016-01-27 LAB — SURGICAL PATHOLOGY

## 2016-01-27 LAB — TYPE AND SCREEN
ABO/RH(D): A POS
ANTIBODY SCREEN: NEGATIVE
UNIT DIVISION: 0
UNIT DIVISION: 0
Unit division: 0

## 2016-01-27 LAB — PREPARE RBC (CROSSMATCH)

## 2016-01-27 NOTE — Discharge Summary (Signed)
[facility] [place] NAME: [name]    MR#:  [id number]  DATE OF BIRTH:  1951-02-14  DATE OF ADMISSION:  01/24/2016 ADMITTING PHYSICIAN: Epifanio Lesches, MD  DATE OF DISCHARGE: 01/26/2016  6:38 PM  PRIMARY CARE PHYSICIAN: [name], MD   ADMISSION DIAGNOSIS:  Skin mass [R22.9] Symptomatic anemia [D64.9]  DISCHARGE DIAGNOSIS:  Active Problems:   Symptomatic anemia   Skin mass   Lymphadenopathy, inguinal   Anemia   SECONDARY DIAGNOSIS:   Past Medical History:  Diagnosis Date  . CHF (congestive heart failure) ([place])   . Diabetes mellitus    Patient take Metformin.  Marland Kitchen Hypertension   . Leg swelling   . Loose, teeth    PATIENT STATES "HAS 6 TEETH, SOME ARE LOOSE"  . Neuromuscular disorder ([place])    RIGHT HAND AND FINGERS NUMB  . Shortness of breath dyspnea      ADMITTING HISTORY  [name]  is a 65 y.o. 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 Patient hemoglobin drawn an outpatient was 6.4. Patient has been having dizziness, weakness for several days. No chest pain. Because of low hemoglobin patient is sent here. Patient denies any black stools, vomiting of blood.  scheduled to have lymph node biopsy by Dr. Adonis Huguenin 19th of October. She has a history of iron deficiency anemia, history of multiple transfusions before. Never had EGD or colonoscopy last year showed internal hemorrhoids. 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 Patient felt better. He was scheduled for inguinal node biopsy which has been done by Dr. Adonis Huguenin. Patient has tolerated his food well and is being discharged back home in a stable condition.  All his other  medications for diabetes, hypertension remain the same. He will follow-up with surgery regarding his biopsy results.  CONSULTS OBTAINED:  Treatment Team:  Florene Glen, MD  DRUG ALLERGIES:  No Known Allergies  DISCHARGE MEDICATIONS:   Discharge Medication List as of 01/26/2016  3:39 PM    START taking these medications   Details  amLODipine (NORVASC) 10 MG tablet Take 1 tablet (10 mg total) by mouth daily., Starting Thu 01/26/2016, Print    !! ferrous sulfate 325 (65 FE) MG tablet Take 1 tablet (325 mg total) by mouth 2 (two) times daily with a meal., Starting Thu 01/26/2016, Print    HYDROcodone-acetaminophen (NORCO/VICODIN) 5-325 MG tablet Take 1 tablet by mouth every 6 (six) hours as needed for severe pain., Starting Thu 01/26/2016, Print     !! - Potential duplicate medications found. Please discuss with provider.    CONTINUE these medications which have NOT CHANGED   Details  aspirin EC 81 MG tablet Take 81 mg by mouth daily., Until Discontinued, Historical Med    atorvastatin (LIPITOR) 40 MG tablet Take 40 mg by mouth at bedtime., Until Discontinued, Historical Med    !! ferrous sulfate 325 (65 FE) MG tablet Take 325 mg by mouth at bedtime., Until Discontinued, Historical Med    hydrOXYzine (ATARAX/VISTARIL) 25 MG tablet Take 25 mg by mouth 3 (three) times daily as needed., Until Discontinued, Historical Med    lisinopril (PRINIVIL,ZESTRIL) 40 MG tablet Take 40 mg by mouth daily., Historical Med  metFORMIN (GLUCOPHAGE) 1000 MG tablet Take 1,000 mg by mouth 2 (two) times daily with a meal., Until Discontinued, Historical Med    triamcinolone ointment (KENALOG) 0.1 % Apply 1 application topically 3 (three) times daily as needed., Until Discontinued, Historical Med     !! - Potential duplicate medications found. Please discuss with provider.      Today   VITAL SIGNS:  Blood pressure (!) 171/93, pulse 79, temperature 97.5 F (36.4 C), resp. rate 18, height 5'  9.5" (1.765 m), weight 76.2 kg (168 lb), SpO2 98 %.  I/O:   Intake/Output Summary (Last 24 hours) at 01/27/16 1421 Last data filed at 01/26/16 1446  Gross per 24 hour  Intake                0 ml  Output              300 ml  Net             -300 ml    PHYSICAL EXAMINATION:  Physical Exam  GENERAL:  65 y.o.-year-old patient lying in the bed with no acute distress.  LUNGS: Normal breath sounds bilaterally, no wheezing, rales,rhonchi or crepitation. No use of accessory muscles of respiration.  CARDIOVASCULAR: S1, S2 normal. No murmurs, rubs, or gallops.  ABDOMEN: Soft, non-tender, non-distended. Bowel sounds present. No organomegaly or mass.  NEUROLOGIC: Moves all 4 extremities. PSYCHIATRIC: The patient is alert and oriented x 3.  SKIN: No obvious rash, lesion, or ulcer.   DATA REVIEW:   CBC  Recent Labs Lab 01/26/16 0427  WBC 7.4  HGB 8.1*  HCT 26.4*  PLT 248    Chemistries   Recent Labs Lab 01/26/16 0427  NA 133*  K 4.1  CL 105  CO2 24  GLUCOSE 127*  BUN 16  CREATININE 1.08  CALCIUM 8.0*    Cardiac Enzymes No results for input(s): TROPONINI in the last 168 hours.  Microbiology Results  Results for orders placed or performed in visit on 09/08/13  Urine culture     Status: None   Collection Time: 09/08/13  1:58 AM  Result Value Ref Range Status   Micro Text Report   Final       SOURCE: CLEAN CATCH    COMMENT                   MIXED BACTERIAL ORGANISMS   COMMENT                   RESULTS SUGGESTIVE OF CONTAMINATION   ANTIBIOTIC                                                        RADIOLOGY:  No results found.  Follow up with PCP in 1 week.  Management plans discussed with the patient, family and they are in agreement.  CODE STATUS:  Code Status History    Date Active Date Inactive Code Status Order ID Comments User Context   01/24/2016  2:33 PM 01/26/2016  9:43 PM Full Code [id number]  Epifanio Lesches, MD ED   09/13/2015  7:48 AM  09/14/2015  4:11 PM Full Code [id number]  Lytle Butte, MD ED   11/11/2014  5:29 PM 11/12/2014 11:25 PM Full Code [id number]  Henreitta Leber, MD Inpatient   11/01/2014  5:01 PM 11/04/2014  8:27 PM Full Code [id number]  Marlyce Huge, MD ED      TOTAL TIME TAKING CARE OF THIS PATIENT ON DAY OF DISCHARGE: more than 30 minutes.   [name] Bow R M.D on 01/27/2016 at 2:21 PM  Between 7am to 6pm - Pager - [phone]  After 6pm go to [email or web address] - password [email or web address] [facility]  Office  [phone]  CC: Primary care physician; [name], MD  Note: This dictation was prepared with Dragon dictation along with smaller phrase technology. Any transcriptional errors that result from this process are unintentional.

## 2016-02-02 ENCOUNTER — Encounter: Payer: Self-pay | Admitting: General Surgery

## 2016-02-03 ENCOUNTER — Encounter: Payer: Self-pay | Admitting: General Surgery

## 2016-02-03 ENCOUNTER — Ambulatory Visit (INDEPENDENT_AMBULATORY_CARE_PROVIDER_SITE_OTHER): Payer: Medicare HMO | Admitting: General Surgery

## 2016-02-03 VITALS — BP 158/92 | HR 75 | Temp 98.7°F | Wt 181.0 lb

## 2016-02-03 DIAGNOSIS — Z4889 Encounter for other specified surgical aftercare: Secondary | ICD-10-CM

## 2016-02-03 NOTE — Progress Notes (Signed)
Outpatient Surgical Follow Up  02/03/2016  [name] is an 65 y.o. male.   Chief Complaint  Patient presents with  . Routine Post Op    Post-op: Right inguinal lymph node excisional biopsy (01/26/16)- Dr. Adonis Huguenin    HPI: 66 year old male returns to clinic for follow up 1 week status post right inguinal Node biopsy. Patient reports she's had some pain to the area but this is mostly resolved. He had some constipation postoperatively requiring the use of laxatives. He states the pain to his rectal mass is much improved with a new cream. He denies any fevers, chills, nausea, vomiting, chest pain, shortness of breath. Here today for pathology review and a wound check.  Past Medical History:  Diagnosis Date  . CHF (congestive heart failure) ([place])   . Diabetes mellitus    Patient take Metformin.  Marland Kitchen Hypertension   . Leg swelling   . Loose, teeth    PATIENT STATES "HAS 6 TEETH, SOME ARE LOOSE"  . Neuromuscular disorder ([place])    RIGHT HAND AND FINGERS NUMB  . Shortness of breath dyspnea     Past Surgical History:  Procedure Laterality Date  . COLONOSCOPY WITH PROPOFOL N/A 11/19/2014   Procedure: COLONOSCOPY WITH PROPOFOL;  Surgeon: Lucilla Lame, MD;  Location: [facility];  Service: Endoscopy;  Laterality: N/A;  DIABETIC-ORAL MEDS  . INGUINAL LYMPH NODE BIOPSY Right 01/26/2016   Procedure: INGUINAL LYMPH NODE BIOPSY;  Surgeon: Clayburn Pert, MD;  Location: [facility];  Service: General;  Laterality: Right;  . RECTAL BIOPSY N/A 11/02/2014   Procedure: BIOPSY RECTAL;  Surgeon: Marlyce Huge, MD;  Location: [facility];  Service: General;  Laterality: N/A;  . RECTAL EXAM UNDER ANESTHESIA N/A 11/02/2014   Procedure: RECTAL EXAM UNDER ANESTHESIA;  Surgeon: Marlyce Huge, MD;  Location: [facility];  Service: General;  Laterality: N/A;  . TONSILLECTOMY      Family History  Problem Relation Age of Onset  . Asthma Mother     Social History:  reports that he has been  smoking Cigarettes.  He has a 5.00 pack-year smoking history. He has never used smokeless tobacco. He reports that he drinks alcohol. He reports that he uses drugs, including Marijuana.  Allergies: No Known Allergies  Medications reviewed.    ROS A multipoint review of systems was completed, all pertinent positives and negatives are documented within the history of present illness remainder are negative.   BP (!) 158/92   Pulse 75   Temp 98.7 F (37.1 C) (Oral)   Wt 82.1 kg (181 lb)   BMI 26.35 kg/m   Physical Exam Gen.: No acute distress Chest: Clear to auscultation Heart: Regular rate and rhythm Abdomen: Soft, nontender, nondistended. Right inguinal incision well approximated with surgical glue still in place. There is a palpable healing ridge but no evidence of erythema or drainage.    No results found for this or any previous visit (from the past 48 hour(s)). No results found.  Assessment/Plan:  1. Aftercare following surgery 65 year old male status post right inguinal lymph node biopsy. The lymph nodes were negative for metastatic cancer. Discussed this in detail with the patient. Given the size of the location of the primary tumor discussed that the next step in treatment would be radiation to shrink the tumor so it could be surgically excised without violating his rectum. Patient voiced understanding. Clinical place the outpatient referral to radiation oncology today and patient will follow-up in clinic after radiation for additional check  and potential excision.     Clayburn Pert, MD FACS General Surgeon  02/03/2016,8:21 AM

## 2016-02-03 NOTE — Patient Instructions (Signed)
We will send your referral to Radiation Oncology so they could see you soon.

## 2016-02-14 ENCOUNTER — Ambulatory Visit
Admission: RE | Admit: 2016-02-14 | Discharge: 2016-02-14 | Disposition: A | Discharge status: Home / Self Care | Payer: Medicare HMO | Source: Ambulatory Visit | Attending: Radiation Oncology | Admitting: Radiation Oncology

## 2016-02-14 ENCOUNTER — Encounter: Payer: Self-pay | Admitting: Radiation Oncology

## 2016-02-14 VITALS — BP 193/98 | HR 80 | Temp 97.0°F | Ht 69.0 in | Wt 182.7 lb

## 2016-02-14 DIAGNOSIS — I10 Essential (primary) hypertension: Secondary | ICD-10-CM | POA: Insufficient documentation

## 2016-02-14 DIAGNOSIS — Z7982 Long term (current) use of aspirin: Secondary | ICD-10-CM | POA: Diagnosis not present

## 2016-02-14 DIAGNOSIS — Z7984 Long term (current) use of oral hypoglycemic drugs: Secondary | ICD-10-CM | POA: Diagnosis not present

## 2016-02-14 DIAGNOSIS — R0602 Shortness of breath: Secondary | ICD-10-CM | POA: Diagnosis not present

## 2016-02-14 DIAGNOSIS — G709 Myoneural disorder, unspecified: Secondary | ICD-10-CM | POA: Diagnosis not present

## 2016-02-14 DIAGNOSIS — F17210 Nicotine dependence, cigarettes, uncomplicated: Secondary | ICD-10-CM | POA: Diagnosis not present

## 2016-02-14 DIAGNOSIS — C44529 Squamous cell carcinoma of skin of other part of trunk: Secondary | ICD-10-CM | POA: Insufficient documentation

## 2016-02-14 DIAGNOSIS — E119 Type 2 diabetes mellitus without complications: Secondary | ICD-10-CM | POA: Insufficient documentation

## 2016-02-14 DIAGNOSIS — I509 Heart failure, unspecified: Secondary | ICD-10-CM | POA: Diagnosis not present

## 2016-02-14 DIAGNOSIS — R609 Edema, unspecified: Secondary | ICD-10-CM | POA: Diagnosis not present

## 2016-02-14 DIAGNOSIS — Z79899 Other long term (current) drug therapy: Secondary | ICD-10-CM | POA: Insufficient documentation

## 2016-02-14 NOTE — Consult Note (Signed)
NEW PATIENT EVALUATION  Name: [name]  MRN: [id number]  Date:   02/14/2016     DOB: [birth year]   This 65 y.o. 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  REFERRING PHYSICIAN: [name], MD  CHIEF COMPLAINT:  Chief Complaint  Patient presents with  . 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   PREVIOUS INVESTIGATIONS:  CT scans and MRI scan of the pelvis reviewed Pathology reports reviewed Clinical notes reviewed  HPI: Patient is a 65 year old male seen back in July 2016 42 perianal lesions which were causing significant bleeding. They were resected with the perianal polyp on the left side showing polypoid vascular peripheralization with superficial necrosis. Right-sided renal mass showed invasive carcinoma with basaloid features. He underwent colonoscopy although I do not see a formal report of that encounter. Patient was lost to follow-up recently has surfaced he did have on MRI scan bilateral inguinal lymphadenopathy underwent biopsy of those nodes which were negative for malignancy. n. This appears to be a skin cancer separate from the anal canal. He continues to have pain on sitting. He is now referred to radiation oncology for consideration of treatment. Patient does have a significant alcohol history. Aside from pain and occasional bleeding in this area no other complaints.  PLANNED TREATMENT REGIMEN: External beam radiation therapy with curative intent  PAST MEDICAL HISTORY:  has a past medical history of CHF (congestive heart failure) ([place]); Diabetes mellitus; Hypertension; Leg swelling; Loose, teeth; Neuromuscular disorder ([place]); and Shortness of breath dyspnea.    PAST SURGICAL HISTORY:  Past Surgical History:  Procedure Laterality Date  . COLONOSCOPY WITH PROPOFOL N/A 11/19/2014   Procedure:  COLONOSCOPY WITH PROPOFOL;  Surgeon: Lucilla Lame, MD;  Location: [place];  Service: Endoscopy;  Laterality: N/A;  DIABETIC-ORAL MEDS  . INGUINAL LYMPH NODE BIOPSY Right 01/26/2016   Procedure: INGUINAL LYMPH NODE BIOPSY;  Surgeon: Clayburn Pert, MD;  Location: [facility];  Service: General;  Laterality: Right;  . RECTAL BIOPSY N/A 11/02/2014   Procedure: BIOPSY RECTAL;  Surgeon: Marlyce Huge, MD;  Location: [facility];  Service: General;  Laterality: N/A;  . RECTAL EXAM UNDER ANESTHESIA N/A 11/02/2014   Procedure: RECTAL EXAM UNDER ANESTHESIA;  Surgeon: Marlyce Huge, MD;  Location: [facility];  Service: General;  Laterality: N/A;  . TONSILLECTOMY      FAMILY HISTORY: family history includes Asthma in his mother.  SOCIAL HISTORY:  reports that he has been smoking Cigarettes.  He has a 5.00 pack-year smoking history. He has never used smokeless tobacco. He reports that he drinks alcohol. He reports that he uses drugs, including Marijuana.  ALLERGIES: Patient has no known allergies.  MEDICATIONS:  Current Outpatient Prescriptions  Medication Sig Dispense Refill  . amLODipine (NORVASC) 10 MG tablet Take 1 tablet (10 mg total) by mouth daily. 30 tablet 0  . aspirin EC 81 MG tablet Take 81 mg by mouth daily.    Marland Kitchen atorvastatin (LIPITOR) 40 MG tablet Take 40 mg by mouth at bedtime.    . ferrous sulfate 325 (65 FE) MG tablet Take 325 mg by mouth at bedtime.    Marland Kitchen HYDROcodone-acetaminophen (NORCO/VICODIN) 5-325 MG tablet Take 1 tablet by mouth every 6 (six) hours as needed for severe pain. 15 tablet 0  . metFORMIN (GLUCOPHAGE) 1000 MG tablet Take 1,000 mg by mouth 2 (two) times daily  with a meal.     No current facility-administered medications for this encounter.     ECOG PERFORMANCE STATUS:  1 - Symptomatic but completely ambulatory  REVIEW OF SYSTEMS:  Patient denies any weight loss, fatigue, weakness, fever, chills or night sweats. Patient denies any loss of vision,  blurred vision. Patient denies any ringing  of the ears or hearing loss. No irregular heartbeat. Patient denies heart murmur or history of fainting. Patient denies any chest pain or pain radiating to her upper extremities. Patient denies any shortness of breath, difficulty breathing at night, cough or hemoptysis. Patient denies any swelling in the lower legs. Patient denies any nausea vomiting, vomiting of blood, or coffee ground material in the vomitus. Patient denies any stomach pain. Patient states has had normal bowel movements no significant constipation or diarrhea. Patient denies any dysuria, hematuria or significant nocturia. Patient denies any problems walking, swelling in the joints or loss of balance. Patient denies any skin changes, loss of hair or loss of weight. Patient denies any excessive worrying or anxiety or significant depression. Patient denies any problems with insomnia. Patient denies excessive thirst, polyuria, polydipsia. Patient denies any swollen glands, patient denies easy bruising or easy bleeding. Patient denies any recent infections, allergies or URI. Patient "s visual fields have not changed significantly in recent time.    PHYSICAL EXAM: BP (!) 193/98   Pulse 80   Temp 97 F (36.1 C)   Ht 5\' 9"  (1.753 m)   Wt 182 lb 10.4 oz (82.9 kg)   BMI 26.97 kg/m  In the right sided area of the buttocks near the gluteal fold is a bandaged area consistent with known area of tumor involvement. He does have some shoddy lymph nodes in the inguinal region. No evidence of mass or nodularity in the anal canal is noted. Well-developed well-nourished patient in NAD. HEENT reveals PERLA, EOMI, discs not visualized.  Oral cavity is clear. No oral mucosal lesions are identified. Neck is clear without evidence of cervical or supraclavicular adenopathy. Lungs are clear to A&P. Cardiac examination is essentially unremarkable with regular rate and rhythm without murmur rub or thrill. Abdomen is  benign with no organomegaly or masses noted. Motor sensory and DTR levels are equal and symmetric in the upper and lower extremities. Cranial nerves II through XII are grossly intact. Proprioception is intact. No peripheral adenopathy or edema is identified. No motor or sensory levels are noted. Crude visual fields are within normal range.  LABORATORY DATA: Pathology reports reviewed    RADIOLOGY RESULTS: CT scans and MRI scan reviewed   IMPRESSION: Right-sided carcinoma of the buttocks with basaloid features in 65 year old male  PLAN: At this time I to go ahead with initial course of 5000 cGy over 5 weeks to this area using external beam radiation therapy. At that time would make further recommendations on continuing with radiation therapy or refer back to surgeon for possible wide local excision. Risks and benefits of treatment including skin reaction fatigue pain were all discussed. I have her set up and ordered CT simulation for later this week.  I would like to take this opportunity to thank you for allowing me to participate in the care of your patient.Armstead Peaks., MD

## 2016-02-16 ENCOUNTER — Ambulatory Visit
Admission: RE | Admit: 2016-02-16 | Discharge: 2016-02-16 | Disposition: A | Discharge status: Home / Self Care | Payer: Medicare HMO | Source: Ambulatory Visit | Attending: Radiation Oncology | Admitting: Radiation Oncology

## 2016-02-16 DIAGNOSIS — C44529 Squamous cell carcinoma of skin of other part of trunk: Secondary | ICD-10-CM | POA: Diagnosis not present

## 2016-02-21 DIAGNOSIS — C44529 Squamous cell carcinoma of skin of other part of trunk: Secondary | ICD-10-CM | POA: Diagnosis not present

## 2016-02-24 ENCOUNTER — Other Ambulatory Visit: Payer: Self-pay | Admitting: *Deleted

## 2016-02-24 ENCOUNTER — Encounter: Payer: Self-pay | Admitting: *Deleted

## 2016-02-24 DIAGNOSIS — C44529 Squamous cell carcinoma of skin of other part of trunk: Secondary | ICD-10-CM

## 2016-02-24 HISTORY — DX: Squamous cell carcinoma of skin of other part of trunk: C44.529

## 2016-02-27 ENCOUNTER — Ambulatory Visit
Admission: RE | Admit: 2016-02-27 | Discharge: 2016-02-27 | Disposition: A | Discharge status: Home / Self Care | Payer: Medicare HMO | Source: Ambulatory Visit | Attending: Radiation Oncology | Admitting: Radiation Oncology

## 2016-02-27 DIAGNOSIS — C44529 Squamous cell carcinoma of skin of other part of trunk: Secondary | ICD-10-CM | POA: Diagnosis not present

## 2016-02-28 ENCOUNTER — Ambulatory Visit
Admission: RE | Admit: 2016-02-28 | Discharge: 2016-02-28 | Disposition: A | Discharge status: Home / Self Care | Payer: Medicare HMO | Source: Ambulatory Visit | Attending: Radiation Oncology | Admitting: Radiation Oncology

## 2016-02-28 DIAGNOSIS — C44529 Squamous cell carcinoma of skin of other part of trunk: Secondary | ICD-10-CM | POA: Diagnosis not present

## 2016-02-29 ENCOUNTER — Ambulatory Visit
Admission: RE | Admit: 2016-02-29 | Discharge: 2016-02-29 | Disposition: A | Discharge status: Home / Self Care | Payer: Medicare HMO | Source: Ambulatory Visit | Attending: Radiation Oncology | Admitting: Radiation Oncology

## 2016-02-29 DIAGNOSIS — C44529 Squamous cell carcinoma of skin of other part of trunk: Secondary | ICD-10-CM | POA: Diagnosis not present

## 2016-03-05 ENCOUNTER — Ambulatory Visit
Admission: RE | Admit: 2016-03-05 | Discharge: 2016-03-05 | Disposition: A | Discharge status: Home / Self Care | Payer: Medicare HMO | Source: Ambulatory Visit | Attending: Radiation Oncology | Admitting: Radiation Oncology

## 2016-03-05 DIAGNOSIS — C44529 Squamous cell carcinoma of skin of other part of trunk: Secondary | ICD-10-CM | POA: Diagnosis not present

## 2016-03-06 ENCOUNTER — Ambulatory Visit
Admission: RE | Admit: 2016-03-06 | Discharge: 2016-03-06 | Disposition: A | Discharge status: Home / Self Care | Payer: Medicare HMO | Source: Ambulatory Visit | Attending: Radiation Oncology | Admitting: Radiation Oncology

## 2016-03-06 DIAGNOSIS — C44529 Squamous cell carcinoma of skin of other part of trunk: Secondary | ICD-10-CM | POA: Diagnosis not present

## 2016-03-07 ENCOUNTER — Ambulatory Visit
Admission: RE | Admit: 2016-03-07 | Discharge: 2016-03-07 | Disposition: A | Discharge status: Home / Self Care | Payer: Medicare HMO | Source: Ambulatory Visit | Attending: Radiation Oncology | Admitting: Radiation Oncology

## 2016-03-07 ENCOUNTER — Other Ambulatory Visit: Payer: Medicare HMO

## 2016-03-07 DIAGNOSIS — C44529 Squamous cell carcinoma of skin of other part of trunk: Secondary | ICD-10-CM | POA: Diagnosis not present

## 2016-03-08 ENCOUNTER — Ambulatory Visit
Admission: RE | Admit: 2016-03-08 | Discharge: 2016-03-08 | Disposition: A | Discharge status: Home / Self Care | Payer: Medicare HMO | Source: Ambulatory Visit | Attending: Radiation Oncology | Admitting: Radiation Oncology

## 2016-03-08 DIAGNOSIS — C44529 Squamous cell carcinoma of skin of other part of trunk: Secondary | ICD-10-CM | POA: Diagnosis not present

## 2016-03-09 ENCOUNTER — Ambulatory Visit
Admission: RE | Admit: 2016-03-09 | Discharge: 2016-03-09 | Disposition: A | Discharge status: Home / Self Care | Payer: Medicare HMO | Source: Ambulatory Visit | Attending: Radiation Oncology | Admitting: Radiation Oncology

## 2016-03-09 ENCOUNTER — Ambulatory Visit: Payer: Medicare Other | Admitting: Podiatry

## 2016-03-09 ENCOUNTER — Inpatient Hospital Stay: Payer: Medicare HMO | Attending: Radiation Oncology

## 2016-03-09 DIAGNOSIS — C44529 Squamous cell carcinoma of skin of other part of trunk: Secondary | ICD-10-CM | POA: Diagnosis not present

## 2016-03-12 ENCOUNTER — Ambulatory Visit
Admission: RE | Admit: 2016-03-12 | Discharge: 2016-03-12 | Disposition: A | Discharge status: Home / Self Care | Payer: Medicare HMO | Source: Ambulatory Visit | Attending: Radiation Oncology | Admitting: Radiation Oncology

## 2016-03-12 DIAGNOSIS — C44529 Squamous cell carcinoma of skin of other part of trunk: Secondary | ICD-10-CM | POA: Diagnosis not present

## 2016-03-13 ENCOUNTER — Ambulatory Visit
Admission: RE | Admit: 2016-03-13 | Discharge: 2016-03-13 | Disposition: A | Discharge status: Home / Self Care | Payer: Medicare HMO | Source: Ambulatory Visit | Attending: Radiation Oncology | Admitting: Radiation Oncology

## 2016-03-13 DIAGNOSIS — C44529 Squamous cell carcinoma of skin of other part of trunk: Secondary | ICD-10-CM | POA: Diagnosis not present

## 2016-03-14 ENCOUNTER — Ambulatory Visit
Admission: RE | Admit: 2016-03-14 | Discharge: 2016-03-14 | Disposition: A | Discharge status: Home / Self Care | Payer: Medicare HMO | Source: Ambulatory Visit | Attending: Radiation Oncology | Admitting: Radiation Oncology

## 2016-03-14 DIAGNOSIS — C44529 Squamous cell carcinoma of skin of other part of trunk: Secondary | ICD-10-CM | POA: Diagnosis not present

## 2016-03-15 ENCOUNTER — Ambulatory Visit
Admission: RE | Admit: 2016-03-15 | Discharge: 2016-03-15 | Disposition: A | Discharge status: Home / Self Care | Payer: Medicare HMO | Source: Ambulatory Visit | Attending: Radiation Oncology | Admitting: Radiation Oncology

## 2016-03-15 DIAGNOSIS — C44529 Squamous cell carcinoma of skin of other part of trunk: Secondary | ICD-10-CM | POA: Diagnosis not present

## 2016-03-16 ENCOUNTER — Inpatient Hospital Stay: Payer: Medicare HMO

## 2016-03-16 ENCOUNTER — Ambulatory Visit: Payer: Medicare HMO

## 2016-03-19 ENCOUNTER — Ambulatory Visit
Admission: RE | Admit: 2016-03-19 | Discharge: 2016-03-19 | Disposition: A | Discharge status: Home / Self Care | Payer: Medicare HMO | Source: Ambulatory Visit | Attending: Radiation Oncology | Admitting: Radiation Oncology

## 2016-03-19 DIAGNOSIS — C44529 Squamous cell carcinoma of skin of other part of trunk: Secondary | ICD-10-CM | POA: Diagnosis not present

## 2016-03-20 ENCOUNTER — Ambulatory Visit
Admission: RE | Admit: 2016-03-20 | Discharge: 2016-03-20 | Disposition: A | Discharge status: Home / Self Care | Payer: Medicare HMO | Source: Ambulatory Visit | Attending: Radiation Oncology | Admitting: Radiation Oncology

## 2016-03-20 DIAGNOSIS — C44529 Squamous cell carcinoma of skin of other part of trunk: Secondary | ICD-10-CM | POA: Diagnosis not present

## 2016-03-21 ENCOUNTER — Ambulatory Visit
Admission: RE | Admit: 2016-03-21 | Discharge: 2016-03-21 | Disposition: A | Discharge status: Home / Self Care | Payer: Medicare HMO | Source: Ambulatory Visit | Attending: Radiation Oncology | Admitting: Radiation Oncology

## 2016-03-21 DIAGNOSIS — C44529 Squamous cell carcinoma of skin of other part of trunk: Secondary | ICD-10-CM | POA: Diagnosis not present

## 2016-03-22 ENCOUNTER — Ambulatory Visit
Admission: RE | Admit: 2016-03-22 | Discharge: 2016-03-22 | Disposition: A | Discharge status: Home / Self Care | Payer: Medicare HMO | Source: Ambulatory Visit | Attending: Radiation Oncology | Admitting: Radiation Oncology

## 2016-03-22 DIAGNOSIS — C44529 Squamous cell carcinoma of skin of other part of trunk: Secondary | ICD-10-CM | POA: Diagnosis not present

## 2016-03-23 ENCOUNTER — Inpatient Hospital Stay: Payer: Medicare HMO

## 2016-03-23 ENCOUNTER — Ambulatory Visit
Admission: RE | Admit: 2016-03-23 | Discharge: 2016-03-23 | Disposition: A | Discharge status: Home / Self Care | Payer: Medicare HMO | Source: Ambulatory Visit | Attending: Radiation Oncology | Admitting: Radiation Oncology

## 2016-03-23 DIAGNOSIS — C44529 Squamous cell carcinoma of skin of other part of trunk: Secondary | ICD-10-CM | POA: Diagnosis not present

## 2016-03-26 ENCOUNTER — Ambulatory Visit
Admission: RE | Admit: 2016-03-26 | Discharge: 2016-03-26 | Disposition: A | Discharge status: Home / Self Care | Payer: Medicare HMO | Source: Ambulatory Visit | Attending: Radiation Oncology | Admitting: Radiation Oncology

## 2016-03-26 DIAGNOSIS — C44529 Squamous cell carcinoma of skin of other part of trunk: Secondary | ICD-10-CM | POA: Diagnosis not present

## 2016-03-27 ENCOUNTER — Ambulatory Visit
Admission: RE | Admit: 2016-03-27 | Discharge: 2016-03-27 | Disposition: A | Discharge status: Home / Self Care | Payer: Medicare HMO | Source: Ambulatory Visit | Attending: Radiation Oncology | Admitting: Radiation Oncology

## 2016-03-27 DIAGNOSIS — C44529 Squamous cell carcinoma of skin of other part of trunk: Secondary | ICD-10-CM | POA: Diagnosis not present

## 2016-03-28 ENCOUNTER — Ambulatory Visit
Admission: RE | Admit: 2016-03-28 | Discharge: 2016-03-28 | Disposition: A | Discharge status: Home / Self Care | Payer: Medicare HMO | Source: Ambulatory Visit | Attending: Radiation Oncology | Admitting: Radiation Oncology

## 2016-03-28 DIAGNOSIS — C44529 Squamous cell carcinoma of skin of other part of trunk: Secondary | ICD-10-CM | POA: Diagnosis not present

## 2016-03-29 ENCOUNTER — Ambulatory Visit: Admission: RE | Admit: 2016-03-29 | Payer: Medicare HMO | Source: Ambulatory Visit

## 2016-03-29 ENCOUNTER — Other Ambulatory Visit: Payer: Self-pay | Admitting: *Deleted

## 2016-03-29 MED ORDER — HYDROCODONE-ACETAMINOPHEN 5-325 MG PO TABS: 1.0000 | ORAL_TABLET | Freq: Four times a day (QID) | ORAL | 0 refills | Status: DC | PRN

## 2016-03-29 MED ORDER — SILVER SULFADIAZINE 1 % EX CREA: 1.0000 "application " | TOPICAL_CREAM | Freq: Two times a day (BID) | CUTANEOUS | 2 refills | Status: DC

## 2016-03-30 ENCOUNTER — Ambulatory Visit: Payer: Medicare HMO

## 2016-03-30 ENCOUNTER — Inpatient Hospital Stay: Payer: Medicare HMO

## 2016-04-03 ENCOUNTER — Ambulatory Visit: Payer: Medicare HMO

## 2016-04-04 ENCOUNTER — Ambulatory Visit: Payer: Medicare HMO

## 2016-04-05 ENCOUNTER — Ambulatory Visit: Payer: Medicare HMO

## 2016-04-06 ENCOUNTER — Ambulatory Visit: Payer: Medicare HMO

## 2016-04-10 ENCOUNTER — Ambulatory Visit
Admission: RE | Admit: 2016-04-10 | Discharge: 2016-04-10 | Disposition: A | Discharge status: Home / Self Care | Payer: Medicare HMO | Source: Ambulatory Visit | Attending: Radiation Oncology | Admitting: Radiation Oncology

## 2016-04-10 DIAGNOSIS — C44529 Squamous cell carcinoma of skin of other part of trunk: Secondary | ICD-10-CM | POA: Diagnosis not present

## 2016-04-11 ENCOUNTER — Ambulatory Visit: Payer: Medicare HMO

## 2016-04-11 ENCOUNTER — Ambulatory Visit
Admission: RE | Admit: 2016-04-11 | Discharge: 2016-04-11 | Disposition: A | Discharge status: Home / Self Care | Payer: Medicare HMO | Source: Ambulatory Visit | Attending: Radiation Oncology | Admitting: Radiation Oncology

## 2016-04-11 DIAGNOSIS — C44529 Squamous cell carcinoma of skin of other part of trunk: Secondary | ICD-10-CM | POA: Diagnosis not present

## 2016-04-12 ENCOUNTER — Ambulatory Visit (INDEPENDENT_AMBULATORY_CARE_PROVIDER_SITE_OTHER): Payer: Medicare HMO | Admitting: Vascular Surgery

## 2016-04-12 ENCOUNTER — Encounter (INDEPENDENT_AMBULATORY_CARE_PROVIDER_SITE_OTHER): Payer: Self-pay | Admitting: Vascular Surgery

## 2016-04-12 ENCOUNTER — Ambulatory Visit: Payer: Medicare HMO

## 2016-04-12 ENCOUNTER — Encounter (INDEPENDENT_AMBULATORY_CARE_PROVIDER_SITE_OTHER): Payer: Medicare HMO

## 2016-04-12 ENCOUNTER — Other Ambulatory Visit (INDEPENDENT_AMBULATORY_CARE_PROVIDER_SITE_OTHER): Payer: Self-pay | Admitting: Vascular Surgery

## 2016-04-12 VITALS — BP 189/100 | HR 87 | Resp 16 | Ht 66.0 in | Wt 175.0 lb

## 2016-04-12 DIAGNOSIS — C44529 Squamous cell carcinoma of skin of other part of trunk: Secondary | ICD-10-CM

## 2016-04-12 DIAGNOSIS — I872 Venous insufficiency (chronic) (peripheral): Secondary | ICD-10-CM

## 2016-04-12 DIAGNOSIS — L97529 Non-pressure chronic ulcer of other part of left foot with unspecified severity: Secondary | ICD-10-CM | POA: Diagnosis not present

## 2016-04-12 DIAGNOSIS — L97519 Non-pressure chronic ulcer of other part of right foot with unspecified severity: Secondary | ICD-10-CM

## 2016-04-12 DIAGNOSIS — L97509 Non-pressure chronic ulcer of other part of unspecified foot with unspecified severity: Secondary | ICD-10-CM | POA: Insufficient documentation

## 2016-04-12 DIAGNOSIS — L97512 Non-pressure chronic ulcer of other part of right foot with fat layer exposed: Secondary | ICD-10-CM

## 2016-04-12 DIAGNOSIS — I890 Lymphedema, not elsewhere classified: Secondary | ICD-10-CM

## 2016-04-12 NOTE — Progress Notes (Signed)
MRN : [id number]  [name] is a 66 y.o. ([birth year]) male who presents with chief complaint of  Chief Complaint  Patient presents with  . New Evaluation    ABI-Mitzel colored feet  .  History of Present Illness: Patient is seen at the request of Dr Cleda Mccreedy for evaluation of leg pain and leg swelling. Dr Cleda Mccreedy has been treating him for an ulceration of the right great toe.  The patient first noticed the swelling remotely. The swelling is associated with pain and discoloration. The pain and swelling worsens with prolonged dependency and improves with elevation. The pain is unrelated to activity.  The patient notes that in the morning the legs are significantly improved but they steadily worsened throughout the course of the day. The patient also notes a steady worsening of the discoloration in the ankle and shin area.   The patient denies claudication symptoms.  The patient denies symptoms consistent with rest pain.  The patient denies and extensive history of DJD and LS spine disease.  The patient has no had any past angiography, interventions or vascular surgery.  Elevation makes the leg symptoms better, dependency makes them much worse. There is no history of ulcerations. The patient denies any recent changes in medications.  The patient has not been wearing graduated compression.  The patient denies a history of DVT or PE. There is no prior history of phlebitis. There is no history of primary lymphedema.  No history of malignancies. No history of trauma or groin or pelvic surgery. There is no history of radiation treatment to the groin or pelvis  The patient denies amaurosis fugax or recent TIA symptoms. There are no recent neurological changes noted. The patient denies recent episodes of angina or shortness of breath  Current Meds  Medication Sig  . amLODipine (NORVASC) 10 MG tablet Take 1 tablet (10 mg total) by mouth daily.  Marland Kitchen aspirin EC 81 MG tablet Take 81 mg by mouth daily.   Marland Kitchen atorvastatin (LIPITOR) 40 MG tablet Take 40 mg by mouth at bedtime.  Marland Kitchen atorvastatin (LIPITOR) 40 MG tablet Lipitor 40MG  Oral Tablet QTY: 90 tablet Days: 90 Refills: 1  Written: 02/24/16 Patient Instructions: 1 every bedtime  . ferrous sulfate 325 (65 FE) MG tablet Take 325 mg by mouth at bedtime.  Marland Kitchen HYDROcodone-acetaminophen (NORCO) 5-325 MG tablet Take 1 tablet by mouth every 6 (six) hours as needed for moderate pain.  . hydrOXYzine (ATARAX/VISTARIL) 25 MG tablet HydrOXYzine HCl 25MG  Oral Tablet QTY: 90 tablet Days: 30 Refills: 1  Written: 02/24/16 Patient Instructions: three times a day as needed  . lisinopril (PRINIVIL,ZESTRIL) 40 MG tablet Lisinopril 40MG  Oral Tablet QTY: 90 tablet Days: 90 Refills: 1  Written: 02/24/16 Patient Instructions: once a day  . metFORMIN (GLUCOPHAGE) 1000 MG tablet Take 1,000 mg by mouth 2 (two) times daily with a meal.  . silver sulfADIAZINE (SILVADENE) 1 % cream Apply 1 application topically 2 (two) times daily.    Past Medical History:  Diagnosis Date  . CHF (congestive heart failure) ([place])   . Diabetes mellitus    Patient take Metformin.  Marland Kitchen Hypertension   . Leg swelling   . Loose, teeth    PATIENT STATES "HAS 6 TEETH, SOME ARE LOOSE"  . Neuromuscular disorder ([place])    RIGHT HAND AND FINGERS NUMB  . Shortness of breath dyspnea   . 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  COLONOSCOPY WITH PROPOFOL N/A 11/19/2014   Procedure: COLONOSCOPY WITH PROPOFOL;  Surgeon: Lucilla Lame, MD;  Location: [place];  Service: Endoscopy;  Laterality: N/A;  DIABETIC-ORAL MEDS  . INGUINAL LYMPH NODE BIOPSY Right 01/26/2016   Procedure: INGUINAL LYMPH NODE BIOPSY;  Surgeon: Clayburn Pert, MD;  Location: [facility];  Service: General;  Laterality: Right;  . RECTAL BIOPSY N/A 11/02/2014   Procedure: BIOPSY RECTAL;  Surgeon: Marlyce Huge, MD;  Location: [facility];  Service: General;   Laterality: N/A;  . RECTAL EXAM UNDER ANESTHESIA N/A 11/02/2014   Procedure: RECTAL EXAM UNDER ANESTHESIA;  Surgeon: Marlyce Huge, MD;  Location: [facility];  Service: General;  Laterality: N/A;  . TONSILLECTOMY      Social History Social History  Substance Use Topics  . Smoking status: Current Every Day Smoker    Packs/day: 0.25    Years: 20.00    Types: Cigarettes  . Smokeless tobacco: Never Used  . Alcohol use Yes     Comment: States that he drinks 1 quart beer. daily (No ETOH in last 3 days)    Family History Family History  Problem Relation Age of Onset  . Asthma Mother   No family history of bleeding/clotting disorders, porphyria or autoimmune disease   No Known Allergies   REVIEW OF SYSTEMS (Negative unless checked)  Constitutional: [] Weight loss  [] Fever  [] Chills Cardiac: [] Chest pain   [] Chest pressure   [] Palpitations   [] Shortness of breath when laying flat   [] Shortness of breath with exertion. Vascular:  [] Pain in legs with walking   [] Pain in legs at rest  [] History of DVT   [] Phlebitis   [x] Swelling in legs   [] Varicose veins   [x] Non-healing ulcers Pulmonary:   [] Uses home oxygen   [] Productive cough   [] Hemoptysis   [] Wheeze  [] COPD   [] Asthma Neurologic:  [] Dizziness   [] Seizures   [] History of stroke   [] History of TIA  [] Aphasia   [] Vissual changes   [] Weakness or numbness in arm   [] Weakness or numbness in leg Musculoskeletal:   [] Joint swelling   [x] Joint pain   [] Low back pain Hematologic:  [] Easy bruising  [] Easy bleeding   [] Hypercoagulable state   [] Anemic Gastrointestinal:  [] Diarrhea   [] Vomiting  [] Gastroesophageal reflux/heartburn   [] Difficulty swallowing. Genitourinary:  [] Chronic kidney disease   [] Difficult urination  [] Frequent urination   [] Blood in urine Skin:  [] Rashes   [] Ulcers  Psychological:  [] History of anxiety   []  History of major depression.  Physical Examination  Vitals:   04/12/16 1557  BP: (!) 189/100  Pulse: 87    Resp: 16  Weight: 175 lb (79.4 kg)  Height: 5\' 6"  (1.676 m)   Body mass index is 28.25 kg/m. Gen: WD/WN, NAD Head: [place]/AT, No temporalis wasting.  Ear/Nose/Throat: Hearing grossly intact, nares w/o erythema or drainage, poor dentition Eyes: PER, EOMI, sclera nonicteric.  Neck: Supple, no masses.  No bruit or JVD.  Pulmonary:  Good air movement, clear to auscultation bilaterally, no use of accessory muscles.  Cardiac: RRR, normal S1, S2, no Murmurs. Vascular:  4+ edema bilateral lower extremities; ulceration of the right great toe noninfected.  Severe discoloration of both legs and ankles as well as the dorsum of the feet. Vessel Right Left  Radial Palpable Palpable  Ulnar Palpable Palpable  Brachial Palpable Palpable  Carotid Palpable Palpable  Femoral Palpable Palpable  Popliteal Palpable Palpable  PT Not Palpable Not Palpable  DP Not Palpable Not Palpable   Gastrointestinal:  soft, non-distended. No guarding/no peritoneal signs.  Musculoskeletal: M/S 5/5 throughout.  No deformity or atrophy.  Neurologic: CN 2-12 intact. Pain and light touch intact in extremities.  Symmetrical.  Speech is fluent. Motor exam as listed above. Psychiatric: Judgment intact, Mood & affect appropriate for pt's clinical situation. Dermatologic: No rashes or ulcers noted.  No changes consistent with cellulitis. Lymph : No Cervical lymphadenopathy, no lichenification or skin changes of chronic lymphedema.  CBC Lab Results  Component Value Date   WBC 7.4 01/26/2016   HGB 8.1 (L) 01/26/2016   HCT 26.4 (L) 01/26/2016   MCV 66.3 (L) 01/26/2016   PLT 248 01/26/2016    BMET    Component Value Date/Time   NA 133 (L) 01/26/2016 0427   NA 122 (L) 08/07/2014 1958   K 4.1 01/26/2016 0427   K 4.9 08/07/2014 1958   CL 105 01/26/2016 0427   CL 88 (L) 08/07/2014 1958   CO2 24 01/26/2016 0427   CO2 27 08/07/2014 1958   GLUCOSE 127 (H) 01/26/2016 0427   GLUCOSE 765 (HH) 08/07/2014 1958   BUN 16  01/26/2016 0427   BUN 19 08/07/2014 1958   CREATININE 1.08 01/26/2016 0427   CREATININE 1.31 (H) 08/07/2014 1958   CALCIUM 8.0 (L) 01/26/2016 0427   CALCIUM 9.0 08/07/2014 1958   GFRNONAA >60 01/26/2016 0427   GFRNONAA 58 (L) 08/07/2014 1958   GFRAA >60 01/26/2016 0427   GFRAA >60 08/07/2014 1958   CrCl cannot be calculated (Patient's most recent lab result is older than the maximum 21 days allowed.).  COAG Lab Results  Component Value Date   INR 1.15 11/01/2014   INR 1.1 09/08/2013   INR 1.1 05/05/2013    Radiology No results found.  Assessment/Plan 1. Chronic venous insufficiency No surgery or intervention at this point in time.    I have had a long discussion with the patient regarding venous insufficiency and why it  causes symptoms. I have discussed with the patient the chronic skin changes that accompany venous insufficiency and the long term sequela such as infection and ulceration.  Patient will begin wearing graduated compression stockings class 1 (20-30 mmHg) or compression wraps on a daily basis a prescription was given. The patient will put the stockings on first thing in the morning and removing them in the evening. The patient is instructed specifically not to sleep in the stockings.    In addition, behavioral modification including several periods of elevation of the lower extremities during the day will be continued. I have demonstrated that proper elevation is a position with the ankles at heart level.  The patient is instructed to begin routine exercise, especially walking on a daily basis  Patient should undergo duplex ultrasound of the venous system to ensure that DVT or reflux is not present.  Following the review of the ultrasound the patient will follow up in 2-3 months to reassess the degree of swelling and the control that graduated compression stockings or compression wraps  is offering.   The patient can be assessed for a Lymph Pump at that time  - VAS  [place] LOWER EXTREMITY VENOUS REFLUX; Future  2. Lymphedema See Plan #1  3. Skin ulcer of right great toe with fat layer exposed ([place]) Continue follow up with Dr Cleda Mccreedy  4. 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 This too will aggravate the lymphedema and venous insufficiency of his  lower extremities as the lymphatics within the groin regions will likely be within the field.   Hortencia Pilar, MD  04/12/2016 4:01 PM

## 2016-04-13 ENCOUNTER — Ambulatory Visit
Admission: RE | Admit: 2016-04-13 | Discharge: 2016-04-13 | Disposition: A | Discharge status: Home / Self Care | Payer: Medicare HMO | Source: Ambulatory Visit | Attending: Radiation Oncology | Admitting: Radiation Oncology

## 2016-04-13 DIAGNOSIS — C44529 Squamous cell carcinoma of skin of other part of trunk: Secondary | ICD-10-CM | POA: Diagnosis not present

## 2016-04-16 ENCOUNTER — Ambulatory Visit
Admission: RE | Admit: 2016-04-16 | Discharge: 2016-04-16 | Disposition: A | Discharge status: Home / Self Care | Payer: Medicare HMO | Source: Ambulatory Visit | Attending: Radiation Oncology | Admitting: Radiation Oncology

## 2016-04-16 DIAGNOSIS — C44529 Squamous cell carcinoma of skin of other part of trunk: Secondary | ICD-10-CM | POA: Diagnosis not present

## 2016-04-17 ENCOUNTER — Ambulatory Visit
Admission: RE | Admit: 2016-04-17 | Discharge: 2016-04-17 | Disposition: A | Discharge status: Home / Self Care | Payer: Medicare HMO | Source: Ambulatory Visit | Attending: Radiation Oncology | Admitting: Radiation Oncology

## 2016-04-17 ENCOUNTER — Ambulatory Visit: Payer: Medicare HMO

## 2016-04-17 DIAGNOSIS — C44529 Squamous cell carcinoma of skin of other part of trunk: Secondary | ICD-10-CM | POA: Diagnosis not present

## 2016-04-18 ENCOUNTER — Ambulatory Visit: Payer: Medicare HMO

## 2016-05-07 ENCOUNTER — Ambulatory Visit: Payer: Medicare HMO | Admitting: Radiation Oncology

## 2016-05-08 ENCOUNTER — Encounter (INDEPENDENT_AMBULATORY_CARE_PROVIDER_SITE_OTHER): Payer: Self-pay | Admitting: Vascular Surgery

## 2016-05-08 ENCOUNTER — Ambulatory Visit (INDEPENDENT_AMBULATORY_CARE_PROVIDER_SITE_OTHER): Payer: Medicare HMO

## 2016-05-08 ENCOUNTER — Ambulatory Visit (INDEPENDENT_AMBULATORY_CARE_PROVIDER_SITE_OTHER): Payer: Medicare HMO | Admitting: Vascular Surgery

## 2016-05-08 VITALS — BP 164/93 | HR 83 | Resp 16 | Ht 69.5 in | Wt 193.4 lb

## 2016-05-08 DIAGNOSIS — L97509 Non-pressure chronic ulcer of other part of unspecified foot with unspecified severity: Secondary | ICD-10-CM

## 2016-05-08 DIAGNOSIS — E118 Type 2 diabetes mellitus with unspecified complications: Secondary | ICD-10-CM

## 2016-05-08 DIAGNOSIS — I872 Venous insufficiency (chronic) (peripheral): Secondary | ICD-10-CM

## 2016-05-08 DIAGNOSIS — I890 Lymphedema, not elsewhere classified: Secondary | ICD-10-CM

## 2016-05-08 DIAGNOSIS — E11621 Type 2 diabetes mellitus with foot ulcer: Secondary | ICD-10-CM | POA: Insufficient documentation

## 2016-05-08 DIAGNOSIS — M869 Osteomyelitis, unspecified: Secondary | ICD-10-CM

## 2016-05-08 DIAGNOSIS — E1169 Type 2 diabetes mellitus with other specified complication: Secondary | ICD-10-CM

## 2016-05-08 NOTE — Progress Notes (Signed)
Subjective:    Patient ID: [name], male    DOB: 1950/08/23, 66 y.o.   MRN: [id number] Chief Complaint  Patient presents with  . Follow-up   Patient last seen on 04/12/16 at the request of Dr Cleda Mccreedy for evaluation of leg pain and leg swelling. Dr Cleda Mccreedy has been treating him for an ulceration of the right great toe. The patient first noticed the swelling remotely. The swelling is associated with pain and discoloration. The pain and swelling worsens with prolonged dependency and improves with elevation. The pain is unrelated to activity.  The patient notes that in the morning the legs are significantly improved but they steadily worsened throughout the course of the day. The patient also notes a steady worsening of the discoloration in the ankle and shin area. The patient has bought compression stockings but has not worn them yet. He has been elevating his legs. Today, the patient underwent a bilateral venous duplex which was notable for No SVT, No DVT and reflux in both GSV.    Review of Systems  Constitutional: Negative.   HENT: Negative.   Eyes: Negative.   Respiratory: Negative.   Cardiovascular: Negative.   Gastrointestinal: Negative.   Endocrine: Negative.   Genitourinary: Negative.   Musculoskeletal: Negative.   Skin: Negative.   Allergic/Immunologic: Negative.   Neurological: Negative.   Hematological: Negative.   Psychiatric/Behavioral: Negative.       Objective:   Physical Exam Gen: WD/WN, NAD Head: [place]/AT, No temporalis wasting.  Ear/Nose/Throat: Hearing grossly intact, nares w/o erythema or drainage, poor dentition Eyes: PER, EOMI, sclera nonicteric.  Neck: Supple, no masses.  No bruit or JVD.  Pulmonary:  Good air movement, clear to auscultation bilaterally, no use of accessory muscles.  Cardiac: RRR, normal S1, S2, no Murmurs. Vascular:  4+ edema bilateral lower extremities; ulceration of the right great toe noninfected.  Severe discoloration of both legs and  ankles as well as the dorsum of the feet. Vessel Right Left  Radial Palpable Palpable  Ulnar Palpable Palpable  Brachial Palpable Palpable  Carotid Palpable Palpable  Femoral Palpable Palpable  Popliteal Palpable Palpable  PT Not Palpable Not Palpable  DP Not Palpable Not Palpable   Gastrointestinal: soft, non-distended. No guarding/no peritoneal signs.  Musculoskeletal: M/S 5/5 throughout.  No deformity or atrophy.  Neurologic: CN 2-12 intact. Pain and light touch intact in extremities.  Symmetrical.  Speech is fluent. Motor exam as listed above. Psychiatric: Judgment intact, Mood & affect appropriate for pt's clinical situation. Dermatologic: No rashes or ulcers noted.  No changes consistent with cellulitis. Lymph : No Cervical lymphadenopathy, no lichenification or skin changes of chronic lymphedema.  BP (!) 164/93   Pulse 83   Resp 16   Ht 5' 9.5" (1.765 m)   Wt 193 lb 6.4 oz (87.7 kg)   BMI 28.15 kg/m   Past Medical History:  Diagnosis Date  . CHF (congestive heart failure) ([place])   . Diabetes mellitus    Patient take Metformin.  Marland Kitchen Hypertension   . Leg swelling   . Loose, teeth    PATIENT STATES "HAS 6 TEETH, SOME ARE LOOSE"  . Neuromuscular disorder ([place])    RIGHT HAND AND FINGERS NUMB  . Shortness of breath dyspnea   . 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 Marital status: Divorced    Spouse name: N/A  . Number of children: N/A  . Years of education: N/A  Occupational History  . Not on file.   Social History Main Topics  . Smoking status: Current Every Day Smoker    Packs/day: 0.25    Years: 20.00    Types: Cigarettes  . Smokeless tobacco: Never Used  . Alcohol use Yes     Comment: States that he drinks 1 quart beer. daily (No ETOH in last 3 days)  . Drug use: Yes    Types: Marijuana     Comment: Last Use 2016 per patient  . Sexual activity: Not on file   Other Topics Concern  . Not on file    Social History Narrative  . No narrative on file   Past Surgical History:  Procedure Laterality Date  . COLONOSCOPY WITH PROPOFOL N/A 11/19/2014   Procedure: COLONOSCOPY WITH PROPOFOL;  Surgeon: Lucilla Lame, MD;  Location: [facility];  Service: Endoscopy;  Laterality: N/A;  DIABETIC-ORAL MEDS  . INGUINAL LYMPH NODE BIOPSY Right 01/26/2016   Procedure: INGUINAL LYMPH NODE BIOPSY;  Surgeon: Clayburn Pert, MD;  Location: [facility];  Service: General;  Laterality: Right;  . RECTAL BIOPSY N/A 11/02/2014   Procedure: BIOPSY RECTAL;  Surgeon: Marlyce Huge, MD;  Location: [facility];  Service: General;  Laterality: N/A;  . RECTAL EXAM UNDER ANESTHESIA N/A 11/02/2014   Procedure: RECTAL EXAM UNDER ANESTHESIA;  Surgeon: Marlyce Huge, MD;  Location: [facility];  Service: General;  Laterality: N/A;  . TONSILLECTOMY     Family History  Problem Relation Age of Onset  . Asthma Mother    No Known Allergies     Assessment & Plan:  Patient last seen on 04/12/16 at the request of Dr Cleda Mccreedy for evaluation of leg pain and leg swelling. Dr Cleda Mccreedy has been treating him for an ulceration of the right great toe. The patient first noticed the swelling remotely. The swelling is associated with pain and discoloration. The pain and swelling worsens with prolonged dependency and improves with elevation. The pain is unrelated to activity.  The patient notes that in the morning the legs are significantly improved but they steadily worsened throughout the course of the day. The patient also notes a steady worsening of the discoloration in the ankle and shin area. The patient has bought compression stockings but has not worn them yet. He has been elevating his legs. Today, the patient underwent a bilateral venous duplex which was notable for No SVT, No DVT and reflux in both GSV.  1. Lymphedema - New I discussed lymphedema and what symptoms it causes and how to manage them. The patient was encouraged  to wear graduated compression stockings (20-30 mmHg) on a daily basis. The patient was instructed to begin wearing the stockings first thing in the morning and removing them in the evening. The patient was instructed specifically not to sleep in the stockings. . In addition, behavioral modification including elevation during the day will be initiated. We discussed a lymphedema pump if conventional therapy goes not work. The patient was advised to follow up in two months after wearing her compression stockings daily with elevation. Information on compression stockings, lymphedema and the lymphedema pump was given to the patient. The patient was instructed to call the office in the interim if any worsening edema or ulcerations to the legs, feet or toes occurs. The patient expresses their understanding.  2. Chronic venous insufficiency - New The patient was encouraged to wear graduated compression stockings (20-30 mmHg) on a daily basis. The patient was instructed to begin wearing the stockings  first thing in the morning and removing them in the evening. The patient was instructed specifically not to sleep in the stockings.  In addition, behavioral modification including elevation during the day will be initiated. Anti-inflammatories for pain. The patient is likely to benefit from endovenous laser ablation. I have discussed the risks and benefits of the procedure. The risks primarily include DVT, recanalization, bleeding, infection, and inability to gain access. The patient will follow up in two months to asses conservative management.  Information on chronic venous insufficiency and compression stockings was given to the patient. The patient was instructed to call the office in the interim if any worsening edema or ulcerations to the legs, feet or toes occurs. The patient expresses their understanding.  3. Type 2 diabetes mellitus with complication, without long-term current use of insulin ([facility]CC) -  Stable Encouraged good control as its slows the progression of atherosclerotic disease  Current Outpatient Prescriptions on File Prior to Visit  Medication Sig Dispense Refill  . amLODipine (NORVASC) 10 MG tablet Take 1 tablet (10 mg total) by mouth daily. 30 tablet 0  . aspirin EC 81 MG tablet Take 81 mg by mouth daily.    Marland Kitchen atorvastatin (LIPITOR) 40 MG tablet Take 40 mg by mouth at bedtime.    Marland Kitchen atorvastatin (LIPITOR) 40 MG tablet Lipitor 40MG  Oral Tablet QTY: 90 tablet Days: 90 Refills: 1  Written: 02/24/16 Patient Instructions: 1 every bedtime    . ferrous sulfate 325 (65 FE) MG tablet Take 325 mg by mouth at bedtime.    Marland Kitchen HYDROcodone-acetaminophen (NORCO) 5-325 MG tablet Take 1 tablet by mouth every 6 (six) hours as needed for moderate pain. 30 tablet 0  . hydrOXYzine (ATARAX/VISTARIL) 25 MG tablet HydrOXYzine HCl 25MG  Oral Tablet QTY: 90 tablet Days: 30 Refills: 1  Written: 02/24/16 Patient Instructions: three times a day as needed    . lisinopril (PRINIVIL,ZESTRIL) 40 MG tablet Lisinopril 40MG  Oral Tablet QTY: 90 tablet Days: 90 Refills: 1  Written: 02/24/16 Patient Instructions: once a day    . metFORMIN (GLUCOPHAGE) 1000 MG tablet Take 1,000 mg by mouth 2 (two) times daily with a meal.    . silver sulfADIAZINE (SILVADENE) 1 % cream Apply 1 application topically 2 (two) times daily. 50 g 2   No current facility-administered medications on file prior to visit.     There are no Patient Instructions on file for this visit. No Follow-up on file.   Jolean Madariaga A Seichi Kaufhold, PA-C

## 2016-05-24 ENCOUNTER — Encounter: Payer: Self-pay | Admitting: Radiation Oncology

## 2016-05-24 ENCOUNTER — Ambulatory Visit
Admission: RE | Admit: 2016-05-24 | Discharge: 2016-05-24 | Disposition: A | Discharge status: Home / Self Care | Payer: Medicare Other | Source: Ambulatory Visit | Attending: Radiation Oncology | Admitting: Radiation Oncology

## 2016-05-24 ENCOUNTER — Other Ambulatory Visit: Payer: Self-pay | Admitting: *Deleted

## 2016-05-24 VITALS — BP 191/90 | HR 80 | Temp 97.9°F | Wt 198.2 lb

## 2016-05-24 DIAGNOSIS — R1084 Generalized abdominal pain: Secondary | ICD-10-CM

## 2016-05-24 DIAGNOSIS — Z923 Personal history of irradiation: Secondary | ICD-10-CM | POA: Insufficient documentation

## 2016-05-24 DIAGNOSIS — C44520 Squamous cell carcinoma of anal skin: Secondary | ICD-10-CM | POA: Insufficient documentation

## 2016-05-24 DIAGNOSIS — I890 Lymphedema, not elsewhere classified: Secondary | ICD-10-CM | POA: Diagnosis not present

## 2016-05-24 DIAGNOSIS — K7460 Unspecified cirrhosis of liver: Secondary | ICD-10-CM | POA: Insufficient documentation

## 2016-05-24 DIAGNOSIS — R188 Other ascites: Secondary | ICD-10-CM | POA: Diagnosis not present

## 2016-05-24 DIAGNOSIS — C44529 Squamous cell carcinoma of skin of other part of trunk: Secondary | ICD-10-CM

## 2016-05-24 NOTE — Progress Notes (Signed)
Radiation Oncology Follow up Note  Name: [name]   Date:   05/24/2016 MRN:  [id number] DOB: [birth year]    This 66 y.o. 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  REFERRING PROVIDER: [name], MD  HPI: patient is a 66 year old male now one month out having completed palliative radiation therapy to left perianal squamous cell carcinoma. He has developed some significant lymphedema of both lower extremities as had seen vascular surgeon.Marland Kitchen He is wearing some compression stockings as try to keep his lower extremities elevated.deep vein thrombosis has been ruled out with incompetence of bilateral great saphenous veins.the area of squamous cell carcinoma in the perianal left buttocks is not draining no longer painful.  COMPLICATIONS OF TREATMENT: none  FOLLOW UP COMPLIANCE: keeps appointments   PHYSICAL EXAM:  BP (!) 191/90   Pulse 80   Temp 97.9 F (36.6 C)   Wt 198 lb 3.1 oz (89.9 kg)   BMI 28.85 kg/m  Patient has significant lymphedema of bilateral lower extremities. Perianal region in the left buttocks is markedly contracted in size no longer draining and granulating in. No inguinal adenopathy is identified. Well-developed well-nourished patient in NAD. HEENT reveals PERLA, EOMI, discs not visualized.  Oral cavity is clear. No oral mucosal lesions are identified. Neck is clear without evidence of cervical or supraclavicular adenopathy. Lungs are clear to A&P. Cardiac examination is essentially unremarkable with regular rate and rhythm without murmur rub or thrill. Abdomen is benign with no organomegaly or masses noted. Motor sensory and DTR levels are equal and symmetric in the upper and lower extremities. Cranial nerves II through XII are grossly intact. Proprioception is intact. No peripheral adenopathy or edema is identified. No motor or sensory levels are noted. Crude visual fields are within  normal range.  RADIOLOGY RESULTS: CT scan of abdomen and pelvis has been ordered with contrast  PLAN: at this time of ordered an abdominal pelvic CT scan with contrast to rule out any possibility some of his lower extremity edema may be related to metastatic disease or other etiologies. I've asked to see the patient back one week after his scan. The area in the left perianal region looks much improved with no pain at this time I believe we can continue to observe this. Also we did not include any inguinal lymph nodes in his treatment plan his treatment was Kennedy [name] the rectum at the left buttocks. I do not believe radiation plays any role in his lymphedema. Will make further recommendations after review his CT scan.  I would like to take this opportunity to thank you for allowing me to participate in the care of your patient.Armstead Peaks., MD

## 2016-05-30 ENCOUNTER — Ambulatory Visit
Admission: RE | Admit: 2016-05-30 | Discharge: 2016-05-30 | Disposition: A | Discharge status: Home / Self Care | Payer: Medicare Other | Source: Ambulatory Visit | Attending: Radiation Oncology | Admitting: Radiation Oncology

## 2016-05-30 DIAGNOSIS — R1084 Generalized abdominal pain: Secondary | ICD-10-CM | POA: Diagnosis present

## 2016-05-30 DIAGNOSIS — R609 Edema, unspecified: Secondary | ICD-10-CM | POA: Diagnosis not present

## 2016-05-30 DIAGNOSIS — J90 Pleural effusion, not elsewhere classified: Secondary | ICD-10-CM | POA: Insufficient documentation

## 2016-05-30 DIAGNOSIS — R161 Splenomegaly, not elsewhere classified: Secondary | ICD-10-CM | POA: Insufficient documentation

## 2016-05-30 DIAGNOSIS — R590 Localized enlarged lymph nodes: Secondary | ICD-10-CM | POA: Diagnosis not present

## 2016-05-30 DIAGNOSIS — R188 Other ascites: Secondary | ICD-10-CM | POA: Diagnosis not present

## 2016-05-30 DIAGNOSIS — K7460 Unspecified cirrhosis of liver: Secondary | ICD-10-CM | POA: Diagnosis not present

## 2016-05-30 LAB — POCT I-STAT CREATININE: Creatinine, Ser: 1.3 mg/dL — ABNORMAL HIGH (ref 0.61–1.24)

## 2016-05-30 MED ORDER — IOPAMIDOL (ISOVUE-300) INJECTION 61%
100.0000 mL | Freq: Once | INTRAVENOUS | Status: AC | PRN
  Administered 2016-05-30: 100 mL via INTRAVENOUS

## 2016-06-04 ENCOUNTER — Ambulatory Visit
Admission: RE | Admit: 2016-06-04 | Discharge: 2016-06-04 | Disposition: A | Discharge status: Home / Self Care | Payer: Medicare Other | Source: Ambulatory Visit | Attending: Radiation Oncology | Admitting: Radiation Oncology

## 2016-06-04 ENCOUNTER — Encounter: Payer: Self-pay | Admitting: Radiation Oncology

## 2016-06-04 VITALS — BP 175/98 | HR 86 | Temp 97.8°F | Wt 194.0 lb

## 2016-06-04 DIAGNOSIS — C44520 Squamous cell carcinoma of anal skin: Secondary | ICD-10-CM | POA: Diagnosis not present

## 2016-06-04 DIAGNOSIS — C44529 Squamous cell carcinoma of skin of other part of trunk: Secondary | ICD-10-CM

## 2016-06-04 NOTE — Progress Notes (Signed)
Radiation Oncology Follow up Note  Name: [name]   Date:   06/04/2016 MRN:  [id number] DOB: [birth year]    This 66 y.o. male presents to the clinic today for follow-up for left perianal squamous cell carcinoma.  REFERRING PROVIDER: [name], MD  HPI: Patient is a 66 year old male now out approximately 2 months having completed radiation therapy to a left perianal squamous cell carcinoma. That has responded nicely with no further pain or discharge. He has had lymphedema of both lower extremities I ordered a CT scan. Showing progressive hepatic cirrhosis and new moderate ascites with findings consistent with portal venous hypertension. Do not's really visualize the perianal squamous cell carcinoma on this scan. He states that area is doing well with no pain or discharge. He currently is on diuretic therapy.  COMPLICATIONS OF TREATMENT: none  FOLLOW UP COMPLIANCE: keeps appointments   PHYSICAL EXAM:  BP (!) 175/98   Pulse 86   Temp 97.8 F (36.6 C)   Wt 194 lb 0.1 oz (88 kg)   BMI 28.24 kg/m  Well-developed well-nourished patient in NAD. HEENT reveals PERLA, EOMI, discs not visualized.  Oral cavity is clear. No oral mucosal lesions are identified. Neck is clear without evidence of cervical or supraclavicular adenopathy. Lungs are clear to A&P. Cardiac examination is essentially unremarkable with regular rate and rhythm without murmur rub or thrill. Abdomen is benign with no organomegaly or masses noted. Motor sensory and DTR levels are equal and symmetric in the upper and lower extremities. Cranial nerves II through XII are grossly intact. Proprioception is intact. No peripheral adenopathy or edema is identified. No motor or sensory levels are noted. Crude visual fields are within normal range.  RADIOLOGY RESULTS: CT scan is reviewed and compatible above-stated findings  PLAN: Present time patient is done well from radiation focally to his perianal region. I do not include any  pelvic nodes or inguinal knows my radiation field and pinpointed the area of involvement in the perianal region. Radiation action have no effect on his ascites or lymphadenopathy. I've asked to see him back in 4-5 months for follow-up. He continues close follow-up care with his other medical team. Patient knows to call with any concerns.  I would like to take this opportunity to thank you for allowing me to participate in the care of your patient.Armstead Peaks., MD

## 2016-06-06 ENCOUNTER — Ambulatory Visit: Payer: Medicare Other | Admitting: Radiation Oncology

## 2016-06-11 ENCOUNTER — Ambulatory Visit (INDEPENDENT_AMBULATORY_CARE_PROVIDER_SITE_OTHER): Payer: Medicare HMO | Admitting: Vascular Surgery

## 2016-06-11 ENCOUNTER — Encounter (INDEPENDENT_AMBULATORY_CARE_PROVIDER_SITE_OTHER): Payer: Self-pay | Admitting: Vascular Surgery

## 2016-06-11 VITALS — BP 186/100 | HR 88 | Resp 16 | Ht 69.5 in | Wt 187.0 lb

## 2016-06-11 DIAGNOSIS — I10 Essential (primary) hypertension: Secondary | ICD-10-CM | POA: Insufficient documentation

## 2016-06-11 DIAGNOSIS — I8311 Varicose veins of right lower extremity with inflammation: Secondary | ICD-10-CM | POA: Diagnosis not present

## 2016-06-11 DIAGNOSIS — E118 Type 2 diabetes mellitus with unspecified complications: Secondary | ICD-10-CM | POA: Diagnosis not present

## 2016-06-11 DIAGNOSIS — I872 Venous insufficiency (chronic) (peripheral): Secondary | ICD-10-CM | POA: Diagnosis not present

## 2016-06-11 DIAGNOSIS — I8312 Varicose veins of left lower extremity with inflammation: Secondary | ICD-10-CM

## 2016-06-11 DIAGNOSIS — I890 Lymphedema, not elsewhere classified: Secondary | ICD-10-CM

## 2016-06-11 DIAGNOSIS — E1159 Type 2 diabetes mellitus with other circulatory complications: Secondary | ICD-10-CM | POA: Insufficient documentation

## 2016-06-12 NOTE — Progress Notes (Signed)
MRN : [id number]  [name] is a 66 y.o. (04-05-1951) male who presents with chief complaint of  Chief Complaint  Patient presents with  . Re-evaluation    3 month no studies  .  History of Present Illness: The patient returns for followup evaluation 3 months after the initial visit. The patient continues to have pain in the lower extremities with dependency. The pain is lessened with elevation. Graduated compression stockings, Class I (20-30 mmHg), have been worn but the stockings do not eliminate the leg pain or the swelling.   Over-the-counter analgesics do not improve the symptoms. The degree of discomfort continues to interfere with daily activities. The patient notes the pain in the legs is causing problems with daily exercise, at the workplace and even with household activities and maintenance such as standing in the kitchen preparing meals and doing dishes.   Venous ultrasound shows normal deep venous system, no evidence of acute or chronic DVT.  Superficial reflux is present in the bilateral great saphenous veins  No outpatient prescriptions have been marked as taking for the 06/11/16 encounter (Office Visit) with Katha Cabal, MD.    Past Medical History:  Diagnosis Date  . CHF (congestive heart failure) ([place])   . Diabetes mellitus    Patient take Metformin.  Marland Kitchen Hypertension   . Leg swelling   . Loose, teeth    PATIENT STATES "HAS 6 TEETH, SOME ARE LOOSE"  . Neuromuscular disorder ([place])    RIGHT HAND AND FINGERS NUMB  . Shortness of breath dyspnea   . 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 COLONOSCOPY WITH PROPOFOL N/A 11/19/2014   Procedure: COLONOSCOPY WITH PROPOFOL;  Surgeon: Lucilla Lame, MD;  Location: [place];  Service: Endoscopy;  Laterality: N/A;  DIABETIC-ORAL MEDS  . INGUINAL LYMPH NODE BIOPSY Right 01/26/2016   Procedure: INGUINAL LYMPH NODE BIOPSY;  Surgeon: Clayburn Pert, MD;  Location: [facility];  Service: General;  Laterality: Right;  . RECTAL BIOPSY N/A 11/02/2014   Procedure: BIOPSY RECTAL;  Surgeon: Marlyce Huge, MD;  Location: [facility];  Service: General;  Laterality: N/A;  . RECTAL EXAM UNDER ANESTHESIA N/A 11/02/2014   Procedure: RECTAL EXAM UNDER ANESTHESIA;  Surgeon: Marlyce Huge, MD;  Location: [facility];  Service: General;  Laterality: N/A;  . TONSILLECTOMY      Social History Social History  Substance Use Topics  . Smoking status: Current Every Day Smoker    Packs/day: 0.25    Years: 20.00    Types: Cigarettes  . Smokeless tobacco: Never Used  . Alcohol use Yes     Comment: States that he drinks 1 quart beer. daily (No ETOH in last 3 days)    Family History Family History  Problem Relation Age of Onset  . Asthma Mother   No family history of bleeding/clotting disorders, porphyria or autoimmune disease   No Known Allergies   REVIEW OF SYSTEMS (Negative unless checked)  Constitutional: [] Weight loss  [] Fever  [] Chills Cardiac: [] Chest pain   [] Chest pressure   [] Palpitations   [] Shortness of breath when laying flat   [] Shortness of breath with exertion. Vascular:  [] Pain in legs with walking   [x] Pain in legs with standing  [] History of DVT   [] Phlebitis   [x] Swelling in legs   [x] Varicose veins   [] Non-healing ulcers Pulmonary:   [] Uses home oxygen   [] Productive cough   [] Hemoptysis   []   Wheeze  [] COPD   [] Asthma Neurologic:  [] Dizziness   [] Seizures   [] History of stroke   [] History of TIA  [] Aphasia   [] Vissual changes   [] Weakness or numbness in arm   [] Weakness or numbness in leg Musculoskeletal:   [] Joint swelling   [x] Joint pain   [] Low back pain Hematologic:  [] Easy bruising  [] Easy bleeding   [] Hypercoagulable state   [] Anemic Gastrointestinal:  [] Diarrhea   [] Vomiting  [] Gastroesophageal reflux/heartburn   [] Difficulty swallowing. Genitourinary:  [] Chronic kidney disease   [] Difficult urination   [] Frequent urination   [] Blood in urine Skin:  [] Rashes   [] Ulcers  Psychological:  [] History of anxiety   []  History of major depression.  Physical Examination  Vitals:   06/11/16 0936  BP: (!) 186/100  Pulse: 88  Resp: 16  Weight: 84.8 kg (187 lb)  Height: 5' 9.5" (1.765 m)   Body mass index is 27.22 kg/m. Gen: WD/WN, NAD Head: [place]/AT, No temporalis wasting.  Ear/Nose/Throat: Hearing grossly intact, nares w/o erythema or drainage, poor dentition Eyes: PER, EOMI, sclera nonicteric.  Neck: Supple, no masses.  No bruit or JVD.  Pulmonary:  Good air movement, clear to auscultation bilaterally, no use of accessory muscles.  Cardiac: RRR, normal S1, S2, no Murmurs. Vascular: 2-3+ edema bilaterally with moderate venous changes bilaterally; right greater than left.   Vessel Right Left  Radial Palpable Palpable  Ulnar Palpable Palpable  Brachial Palpable Palpable  Carotid Palpable Palpable  Femoral Palpable Palpable  Popliteal Palpable Palpable  PT Palpable Palpable  DP Palpable Palpable   Gastrointestinal: soft, non-distended. No guarding/no peritoneal signs.  Musculoskeletal: M/S 5/5 throughout.  No deformity or atrophy.  Neurologic: CN 2-12 intact. Pain and light touch intact in extremities.  Symmetrical.  Speech is fluent. Motor exam as listed above. Psychiatric: Judgment intact, Mood & affect appropriate for pt's clinical situation. Dermatologic: Venous stasis dermatitis with no ulcers present.  No changes consistent with cellulitis. Lymph : No Cervical lymphadenopathy, no lichenification or skin changes of chronic lymphedema.  CBC Lab Results  Component Value Date   WBC 7.4 01/26/2016   HGB 8.1 (L) 01/26/2016   HCT 26.4 (L) 01/26/2016   MCV 66.3 (L) 01/26/2016   PLT 248 01/26/2016    BMET    Component Value Date/Time   NA 133 (L) 01/26/2016 0427   NA 122 (L) 08/07/2014 1958   K 4.1 01/26/2016 0427   K 4.9 08/07/2014 1958   CL 105 01/26/2016 0427   CL 88 (L)  08/07/2014 1958   CO2 24 01/26/2016 0427   CO2 27 08/07/2014 1958   GLUCOSE 127 (H) 01/26/2016 0427   GLUCOSE 765 (HH) 08/07/2014 1958   BUN 16 01/26/2016 0427   BUN 19 08/07/2014 1958   CREATININE 1.30 (H) 05/30/2016 1408   CREATININE 1.31 (H) 08/07/2014 1958   CALCIUM 8.0 (L) 01/26/2016 0427   CALCIUM 9.0 08/07/2014 1958   GFRNONAA >60 01/26/2016 0427   GFRNONAA 58 (L) 08/07/2014 1958   GFRAA >60 01/26/2016 0427   GFRAA >60 08/07/2014 1958   Estimated Creatinine Clearance: 59.1 mL/min (by C-G formula based on SCr of 1.3 mg/dL (H)).  COAG Lab Results  Component Value Date   INR 1.15 11/01/2014   INR 1.1 09/08/2013   INR 1.1 05/05/2013    Radiology Ct Abdomen Pelvis W Contrast  Result Date: 05/30/2016 CLINICAL DATA:  Worsening generalized abdominal pain, bloating, and 10 pound weight gain over past 2 weeks. Increased lower extremity edema. Personal history of  squamous cell carcinoma of skin of buttock. EXAM: CT ABDOMEN AND PELVIS WITH CONTRAST TECHNIQUE: Multidetector CT imaging of the abdomen and pelvis was performed using the standard protocol following bolus administration of intravenous contrast. CONTRAST:  142mL ISOVUE-300 IOPAMIDOL (ISOVUE-300) INJECTION 61% COMPARISON:  11/01/2014 FINDINGS: Lower Chest: New small bilateral pleural effusions and mild bibasilar atelectasis. Hepatobiliary: Progressive hepatic cirrhosis since prior study. No liver mass identified. Portal veins are patent. Mild diffuse gallbladder wall thickening, attributable to cirrhosis. No evidence of biliary ductal dilatation. Mild venous collaterals seen within gastrohepatic and gastrosplenic ligaments, consistent with portal venous hypertension. New moderate ascites, as well as diffuse mesenteric and body wall edema. Pancreas:  No mass or inflammatory changes. Spleen: Mild splenomegaly measuring approximately 14 cm in length. No splenic masses identified. Adrenals/Urinary Tract: No masses identified. No evidence  of hydronephrosis. Stomach/Bowel: No evidence of bowel obstruction. Mild diffuse small bowel wall thickening, consistent with hypoalbuminemia. Vascular/Lymphatic: Mild abdominal lymphadenopathy in the gastrohepatic ligament, porta hepatis, and retroperitoneum shows no significant change. A 1.5 cm lymph node in the right cardiophrenic angle has increased in size from 8 mm previously. Mild pelvic lymphadenopathy is seen in the iliac chains and inguinal regions bilaterally, which is mildly increased since previous study. Index lymph node in right iliac chain measures 2.1 cm on image 74/2 compared to 1.8 cm previously. Index right inguinal lymph node measures 1.8 cm on image 92/2 compared to 1.3 cm previously. No abdominal aortic aneurysm.  Aortic atherosclerosis. Reproductive:  No mass identified. Other:  None. Musculoskeletal:  No suspicious bone lesions identified. IMPRESSION: Progressive hepatic cirrhosis since prior study. No evidence of hepatic neoplasm. New moderate ascites, diffuse mesenteric and body wall edema, and small bilateral pleural effusions. Mild diffuse small bowel wall thickening, consistent with hypoalbuminemia. Findings consistent with portal venous hypertension, including mild splenomegaly and upper abdominal venous collaterals. Stable mild abdominal lymphadenopathy. Mild increase in bilateral iliac and inguinal lymphadenopathy in the pelvis, which may be reactive in etiology, with metastatic disease considered less likely. Electronically Signed   By: Earle Gell M.D.   On: 05/30/2016 17:05     Assessment/Plan 1. Chronic venous insufficiency Recommend  I have reviewed my previous  discussion with the patient regarding  varicose veins and why they cause symptoms. Patient will continue  wearing graduated compression stockings class 1 on a daily basis, beginning first thing in the morning and removing them in the evening.    In addition, behavioral modification including elevation during  the day was again discussed and this will continue.  The patient has utilized over the counter pain medications and has been exercising.  However, at this time conservative therapy has not alleviated the patient's symptoms of leg pain and swelling  Recommend: laser ablation of the right and  left great saphenous veins to eliminate the symptoms of pain and swelling of the lower extremities caused by the severe superficial venous reflux disease.   2. Lymphedema Recommend:  No surgery or intervention at this point in time.    I have reviewed my previous discussion with the patient regarding swelling and why it causes symptoms.  Patient will continue wearing graduated compression stockings class 1 (20-30 mmHg) on a daily basis. The patient will  beginning wearing the stockings first thing in the morning and removing them in the evening. The patient is instructed specifically not to sleep in the stockings.    In addition, behavioral modification including several periods of elevation of the lower extremities during the day will be  continued.  This was reviewed with the patient during the initial visit.  The patient will also continue routine exercise, especially walking on a daily basis as was discussed during the initial visit.    Despite conservative treatments including graduated compression therapy class 1 and behavioral modification including exercise and elevation the patient  has not obtained adequate control of the lymphedema.  The patient still has stage 3 lymphedema and therefore, I believe that a lymph pump should be added to improve the control of the patient's lymphedema.  Additionally, a lymph pump is warranted because it will reduce the risk of cellulitis and ulceration in the future.  Patient should follow-up in six months    3. Varicose veins of both lower extremities with inflammation See #1  4. Type 2 diabetes mellitus with complication, without long-term current use of  insulin (HCC) Continue hypoglycemic medications as already ordered, these medications have been reviewed and there are no changes at this time.  Hgb A1C to be monitored as already arranged by primary service   5. Essential hypertension Continue antihypertensive medications as already ordered, these medications have been reviewed and there are no changes at this time.   Hortencia Pilar, MD  06/12/2016 8:26 AM

## 2016-06-14 ENCOUNTER — Telehealth: Payer: Self-pay | Admitting: Gastroenterology

## 2016-06-14 ENCOUNTER — Encounter: Payer: Self-pay | Admitting: Gastroenterology

## 2016-06-14 NOTE — Telephone Encounter (Signed)
Attempted to call patient to schedule. No voice mail sent letter

## 2016-06-14 NOTE — Telephone Encounter (Signed)
Patient needs to be schedule for Hep C, cirrhosis, moderate ascites, just completed tx for anal cancer

## 2016-07-05 ENCOUNTER — Other Ambulatory Visit (INDEPENDENT_AMBULATORY_CARE_PROVIDER_SITE_OTHER): Payer: Medicare HMO | Admitting: Vascular Surgery

## 2016-07-09 ENCOUNTER — Encounter (INDEPENDENT_AMBULATORY_CARE_PROVIDER_SITE_OTHER): Payer: Medicare HMO

## 2016-07-11 ENCOUNTER — Ambulatory Visit (INDEPENDENT_AMBULATORY_CARE_PROVIDER_SITE_OTHER): Payer: Medicare HMO | Admitting: Vascular Surgery

## 2016-07-24 ENCOUNTER — Ambulatory Visit: Payer: Medicare Other | Admitting: Gastroenterology

## 2016-08-13 DIAGNOSIS — R0602 Shortness of breath: Secondary | ICD-10-CM | POA: Insufficient documentation

## 2016-08-13 DIAGNOSIS — R079 Chest pain, unspecified: Secondary | ICD-10-CM | POA: Insufficient documentation

## 2016-08-23 ENCOUNTER — Encounter (INDEPENDENT_AMBULATORY_CARE_PROVIDER_SITE_OTHER): Payer: Self-pay | Admitting: Vascular Surgery

## 2016-08-23 ENCOUNTER — Ambulatory Visit (INDEPENDENT_AMBULATORY_CARE_PROVIDER_SITE_OTHER): Payer: Medicare HMO | Admitting: Vascular Surgery

## 2016-08-23 ENCOUNTER — Ambulatory Visit: Payer: Medicare Other | Admitting: Gastroenterology

## 2016-08-23 VITALS — BP 133/80 | HR 68 | Resp 16 | Wt 172.0 lb

## 2016-08-23 DIAGNOSIS — I8312 Varicose veins of left lower extremity with inflammation: Secondary | ICD-10-CM

## 2016-08-23 DIAGNOSIS — I8311 Varicose veins of right lower extremity with inflammation: Secondary | ICD-10-CM | POA: Diagnosis not present

## 2016-08-26 NOTE — Progress Notes (Signed)
    MRN : [id number]  [name] is a 66 y.o. (Oct 18, 1950) male who presents with chief complaint of painful varicose veins.    The patient's right lower extremity was sterilely prepped and draped.  The ultrasound machine was used to visualize the right great saphenous vein throughout its course.  A segment below the knee was selected for access.  The saphenous vein was accessed without difficulty using ultrasound guidance with a micropuncture needle.   An 0.018  wire was placed beyond the saphenofemoral junction through the sheath and the microneedle was removed.  The 65 cm sheath was then placed over the wire and the wire and dilator were removed.  The laser fiber was placed through the sheath and its tip was placed approximately 2 cm below the saphenofemoral junction.  Tumescent anesthesia was then created with a dilute lidocaine solution.  Laser energy was then delivered with constant withdrawal of the sheath and laser fiber.  Approximately 1746 Joules of energy were delivered over a length of 51 cm.  Sterile dressings were placed.  The patient tolerated the procedure well without complications.

## 2016-08-27 ENCOUNTER — Ambulatory Visit (INDEPENDENT_AMBULATORY_CARE_PROVIDER_SITE_OTHER): Payer: Medicare HMO | Admitting: Gastroenterology

## 2016-08-27 ENCOUNTER — Other Ambulatory Visit: Payer: Self-pay

## 2016-08-27 ENCOUNTER — Other Ambulatory Visit
Admission: RE | Admit: 2016-08-27 | Discharge: 2016-08-27 | Disposition: A | Discharge status: Home / Self Care | Payer: Medicare HMO | Source: Ambulatory Visit | Attending: Gastroenterology | Admitting: Gastroenterology

## 2016-08-27 ENCOUNTER — Encounter: Payer: Self-pay | Admitting: Gastroenterology

## 2016-08-27 ENCOUNTER — Encounter (INDEPENDENT_AMBULATORY_CARE_PROVIDER_SITE_OTHER): Payer: Medicare HMO

## 2016-08-27 DIAGNOSIS — D509 Iron deficiency anemia, unspecified: Secondary | ICD-10-CM

## 2016-08-27 DIAGNOSIS — R590 Localized enlarged lymph nodes: Secondary | ICD-10-CM | POA: Insufficient documentation

## 2016-08-27 DIAGNOSIS — K7031 Alcoholic cirrhosis of liver with ascites: Secondary | ICD-10-CM | POA: Diagnosis present

## 2016-08-27 LAB — COMPREHENSIVE METABOLIC PANEL
ALK PHOS: 100 U/L (ref 38–126)
ALT: 21 U/L (ref 17–63)
ANION GAP: 3 — AB (ref 5–15)
AST: 39 U/L (ref 15–41)
Albumin: 1.7 g/dL — ABNORMAL LOW (ref 3.5–5.0)
BUN: 31 mg/dL — ABNORMAL HIGH (ref 6–20)
CALCIUM: 7.7 mg/dL — AB (ref 8.9–10.3)
CO2: 24 mmol/L (ref 22–32)
Chloride: 104 mmol/L (ref 101–111)
Creatinine, Ser: 1.93 mg/dL — ABNORMAL HIGH (ref 0.61–1.24)
GFR calc non Af Amer: 35 mL/min — ABNORMAL LOW (ref >60)
GFR, EST AFRICAN AMERICAN: 41 mL/min — AB (ref >60)
Glucose, Bld: 114 mg/dL — ABNORMAL HIGH (ref 65–99)
Potassium: 3.7 mmol/L (ref 3.5–5.1)
Sodium: 131 mmol/L — ABNORMAL LOW (ref 135–145)
Total Bilirubin: 0.4 mg/dL (ref 0.3–1.2)
Total Protein: 7.2 g/dL (ref 6.5–8.1)

## 2016-08-27 LAB — IRON AND TIBC
IRON: 25 ug/dL — AB (ref 45–182)
Saturation Ratios: 10 % — ABNORMAL LOW (ref 17.9–39.5)
TIBC: 247 ug/dL — AB (ref 250–450)
UIBC: 222 ug/dL

## 2016-08-27 LAB — CBC
HCT: 25.6 % — ABNORMAL LOW (ref 40.0–52.0)
HEMOGLOBIN: 8.5 g/dL — AB (ref 13.0–18.0)
MCH: 26.4 pg (ref 26.0–34.0)
MCHC: 33.2 g/dL (ref 32.0–36.0)
MCV: 79.6 fL — ABNORMAL LOW (ref 80.0–100.0)
Platelets: 260 10*3/uL (ref 150–440)
RBC: 3.21 MIL/uL — AB (ref 4.40–5.90)
RDW: 15.4 % — ABNORMAL HIGH (ref 11.5–14.5)
WBC: 7.3 10*3/uL (ref 3.8–10.6)

## 2016-08-27 LAB — PROTIME-INR
INR: 0.98
PROTHROMBIN TIME: 13 s (ref 11.4–15.2)

## 2016-08-27 LAB — URINALYSIS, COMPLETE (UACMP) WITH MICROSCOPIC
Bacteria, UA: NONE SEEN
Bilirubin Urine: NEGATIVE
Glucose, UA: 50 mg/dL — AB
KETONES UR: NEGATIVE mg/dL
Leukocytes, UA: NEGATIVE
NITRITE: NEGATIVE
PROTEIN: 100 mg/dL — AB
Specific Gravity, Urine: 1.009 (ref 1.005–1.030)
Squamous Epithelial / LPF: NONE SEEN
pH: 5 (ref 5.0–8.0)

## 2016-08-27 LAB — FERRITIN: Ferritin: 67 ng/mL (ref 24–336)

## 2016-08-27 LAB — SEDIMENTATION RATE: SED RATE: 106 mm/h — AB (ref 0–20)

## 2016-08-27 NOTE — Progress Notes (Signed)
Gastroenterology Consultation  Referring Provider:   Dr Olivia Mackie Primary Care Physician:  System, Provider Not In Primary Gastroenterologist:  Dr. Jonathon Bellows  Reason for Consultation:     Cirrhosis of the liver.         HPI:   [name] is a 66 y.o. y/o male referred.   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 CT abdomen in 05/2016 showed liver cirrhosis, ascites , bowel wall edema , small b/l pleural effusions , mild splenomegaly. Mild abdominal lymphadenopathy .  CT abdomen in 08/2014 showed abdominal lymphadenopathy . Prior evaluation of the lymphadenopathy was negative for neoplasm.   No recent labs. Labs from 01/2016 shows a microcytic anemia with MCV of 66 and HB of 8.1 ,platelet count of 248. Ferritin was 13 in 01/2016 . CMP in 09/2015 shows albumin of 2.8   He says he has been known to have hepatitis C in the past , never been told he has liver disease in the past. He says he used to drink a lot of alcohol in the past , consumed 2 pints of bourbon a week , for 15 years, stopped drinking 2 years back but since has been drinking on and off some beer. Denies any illegal drug use. Denies any blood transfusion. . Did not serve in the [employer] .  Some form of cancer in his anus he says and had "laser " treatment done and says he does not have any "cancer right now".   Never had a colonoscopy or an EGD in the past /. Denies any family history of colon cancer. He has seen some blood in his stool some years back. Lost some weight recently , Does complain of abdominal distension .        Past Medical History:  Diagnosis Date  . CHF (congestive heart failure) ([place])   . Diabetes mellitus    Patient take Metformin.  Marland Kitchen Hypertension   . Leg swelling   . Loose, teeth    PATIENT STATES "HAS 6 TEETH, SOME ARE LOOSE"  . Neuromuscular disorder ([place])    RIGHT HAND AND FINGERS NUMB  . Shortness of breath dyspnea   . 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 COLONOSCOPY WITH PROPOFOL N/A 11/19/2014   Procedure: COLONOSCOPY WITH PROPOFOL;  Surgeon: Lucilla Lame, MD;  Location: [place];  Service: Endoscopy;  Laterality: N/A;  DIABETIC-ORAL MEDS  . INGUINAL LYMPH NODE BIOPSY Right 01/26/2016   Procedure: INGUINAL LYMPH NODE BIOPSY;  Surgeon: Clayburn Pert, MD;  Location: [facility];  Service: General;  Laterality: Right;  . RECTAL BIOPSY N/A 11/02/2014   Procedure: BIOPSY RECTAL;  Surgeon: Marlyce Huge, MD;  Location: [facility];  Service: General;  Laterality: N/A;  . RECTAL EXAM UNDER ANESTHESIA N/A 11/02/2014   Procedure: RECTAL EXAM UNDER ANESTHESIA;  Surgeon: Marlyce Huge, MD;  Location: [facility];  Service: General;  Laterality: N/A;  . TONSILLECTOMY      Prior to Admission medications   Medication Sig Start Date End Date Taking? Authorizing Provider  amLODipine (NORVASC) 10 MG tablet Take 1 tablet (10 mg total) by mouth daily. 01/26/16   [name] Bow, MD  aspirin EC 81 MG tablet Take 81 mg by mouth daily.    [provider]  atorvastatin (LIPITOR) 40 MG tablet Take 40 mg by mouth at bedtime.    [provider]  ferrous sulfate 325 (  65 FE) MG tablet Take 325 mg by mouth at bedtime.    [provider]  HYDROcodone-acetaminophen (NORCO) 5-325 MG tablet Take 1 tablet by mouth every 6 (six) hours as needed for moderate pain. Patient not taking: Reported on 08/23/2016 03/29/16   Noreene Filbert, MD  hydrOXYzine (ATARAX/VISTARIL) 25 MG tablet HydrOXYzine HCl 25MG  Oral Tablet QTY: 90 tablet Days: 30 Refills: 1  Written: 02/24/16 Patient Instructions: three times a day as needed 02/24/16   [provider]  lisinopril (PRINIVIL,ZESTRIL) 40 MG tablet Lisinopril 40MG  Oral Tablet QTY: 90 tablet Days: 90 Refills: 1  Written: 02/24/16 Patient Instructions: once a day 02/24/16   [provider]  metFORMIN (GLUCOPHAGE) 1000  MG tablet Take 1,000 mg by mouth 2 (two) times daily with a meal.    [provider]  silver sulfADIAZINE (SILVADENE) 1 % cream Apply 1 application topically 2 (two) times daily. 03/29/16   Noreene Filbert, MD    Family History  Problem Relation Age of Onset  . Asthma Mother      Social History  Substance Use Topics  . Smoking status: Current Every Day Smoker    Packs/day: 0.25    Years: 20.00    Types: Cigarettes  . Smokeless tobacco: Never Used  . Alcohol use Yes     Comment: States that he drinks 1 quart beer. daily (No ETOH in last 3 days)    Allergies as of 08/27/2016  . (No Known Allergies)    Review of Systems:    All systems reviewed and negative except where noted in HPI.   Physical Exam:  There were no vitals taken for this visit. No LMP for male patient. Psych:  Alert and cooperative. Normal mood and affect. General:   Alert,  Very thin pleasant and cooperative in NAD Head:  Normocephalic and atraumatic. Eyes:  Sclera clear, no icterus.   Conjunctiva pink. Ears:  Normal auditory acuity. Nose:  No deformity, discharge, or lesions. Mouth:  No deformity or lesions,oropharynx pink & moist. Neck:  Supple; no masses or thyromegaly. Lungs:  Respirations even and unlabored.  Clear throughout to auscultation.   No wheezes, crackles, or rhonchi. No acute distress. Heart:  Regular rate and rhythm; no murmurs, clicks, rubs, or gallops. Abdomen: distended , free fluid + Normal bowel sounds.  No bruits.  Soft, non-tender  without masses, hepatosplenomegaly or hernias noted.  No guarding or rebound tenderness.    Msk:  Symmetrical without gross deformities. Good, equal movement & strength bilaterally. Pulses:  Normal pulses noted. Extremities:  No clubbing or edema.  No cyanosis. Neurologic:  Alert and oriented x3;  grossly normal neurologically. Psych:  Alert and cooperative. Normal mood and affect.  Imaging Studies: No results found.  Assessment and Plan:    [name] is a 66 y.o. y/o male has been referred for hepatitis c related cirrhosis . Review of his records shows he has had some abdominal lymphadenopathy going on for a while , unclear of the etiology.   Active problems  1. Liver cirrhosis - likely secondary to alcohol , no encephelopathy   2. Iron deficiency anemia- unclear etiology- not been evaluated so far 3. Abdominal lymphadenopathy- unclear etiology- do not have any records to suggest the actual etiology 4. Ascites 5. Smoker  6. H/o alcohol abuse.   Plan  1. No hepatitis serology available- will order in addition to hepatitis B, autoimmune liver disease panel  2. Diagnostic paracentesis with LDH,albumin , total protein , cell count, culture,  lipase , cultures  3. Doppler USG of the liver 4. Abdominal lymphadenopathy- ?cause with microcytic iron deficiency anemia- wide differentials ranging from neoplasm to connective tissue disorders- will need EGD+colonoscopy +/- capsule study of the small bowel . I will refer to oncology for the  Lymphadenopathy to help determine if further work up is needed such as a PET scan/ flow cytometry/ open biopsy of the lymph node.  5. Urine analysis 6. EGD to screen for esophageal varices and colonoscopy to evaluate iron deficiency anemia and if negative will need capsule study .  7. Check Hep A/B vaccine status and immunize as needed 8. Stop al alcohol and smoking    Follow up in 6 weeks   Dr Jonathon Bellows MD

## 2016-08-28 ENCOUNTER — Other Ambulatory Visit: Payer: Self-pay

## 2016-08-28 ENCOUNTER — Emergency Department
Admission: EM | Admit: 2016-08-28 | Discharge: 2016-08-28 | Disposition: A | Discharge status: Home / Self Care | Payer: Medicare HMO | Attending: Emergency Medicine | Admitting: Emergency Medicine

## 2016-08-28 DIAGNOSIS — Z7984 Long term (current) use of oral hypoglycemic drugs: Secondary | ICD-10-CM | POA: Diagnosis not present

## 2016-08-28 DIAGNOSIS — E860 Dehydration: Secondary | ICD-10-CM | POA: Insufficient documentation

## 2016-08-28 DIAGNOSIS — F17210 Nicotine dependence, cigarettes, uncomplicated: Secondary | ICD-10-CM | POA: Insufficient documentation

## 2016-08-28 DIAGNOSIS — E119 Type 2 diabetes mellitus without complications: Secondary | ICD-10-CM | POA: Diagnosis not present

## 2016-08-28 DIAGNOSIS — Z79899 Other long term (current) drug therapy: Secondary | ICD-10-CM | POA: Insufficient documentation

## 2016-08-28 DIAGNOSIS — I509 Heart failure, unspecified: Secondary | ICD-10-CM | POA: Diagnosis not present

## 2016-08-28 DIAGNOSIS — I110 Hypertensive heart disease with heart failure: Secondary | ICD-10-CM | POA: Diagnosis not present

## 2016-08-28 DIAGNOSIS — Z7982 Long term (current) use of aspirin: Secondary | ICD-10-CM | POA: Diagnosis not present

## 2016-08-28 DIAGNOSIS — R799 Abnormal finding of blood chemistry, unspecified: Secondary | ICD-10-CM | POA: Diagnosis present

## 2016-08-28 LAB — BASIC METABOLIC PANEL
Anion gap: 2 — ABNORMAL LOW (ref 5–15)
BUN: 23 mg/dL — AB (ref 6–20)
CO2: 22 mmol/L (ref 22–32)
CREATININE: 1.62 mg/dL — AB (ref 0.61–1.24)
Calcium: 7.6 mg/dL — ABNORMAL LOW (ref 8.9–10.3)
Chloride: 108 mmol/L (ref 101–111)
GFR calc Af Amer: 50 mL/min — ABNORMAL LOW (ref >60)
GFR calc non Af Amer: 43 mL/min — ABNORMAL LOW (ref >60)
GLUCOSE: 118 mg/dL — AB (ref 65–99)
POTASSIUM: 3.2 mmol/L — AB (ref 3.5–5.1)
SODIUM: 132 mmol/L — AB (ref 135–145)

## 2016-08-28 LAB — BRAIN NATRIURETIC PEPTIDE: B NATRIURETIC PEPTIDE 5: 775 pg/mL — AB (ref 0.0–100.0)

## 2016-08-28 LAB — HCV RNA QUANT
HCV QUANT LOG: 6.464 {Log_IU}/mL (ref >1.70)
HCV Quantitative: 2910000 IU/mL (ref >50)

## 2016-08-28 LAB — ANA W/REFLEX: Anti Nuclear Antibody(ANA): NEGATIVE

## 2016-08-28 LAB — CBC
HEMATOCRIT: 26.3 % — AB (ref 40.0–52.0)
HEMOGLOBIN: 8.8 g/dL — AB (ref 13.0–18.0)
MCH: 26.6 pg (ref 26.0–34.0)
MCHC: 33.4 g/dL (ref 32.0–36.0)
MCV: 79.5 fL — ABNORMAL LOW (ref 80.0–100.0)
Platelets: 296 10*3/uL (ref 150–440)
RBC: 3.31 MIL/uL — AB (ref 4.40–5.90)
RDW: 15.2 % — ABNORMAL HIGH (ref 11.5–14.5)
WBC: 8 10*3/uL (ref 3.8–10.6)

## 2016-08-28 LAB — CBC WITH DIFFERENTIAL/PLATELET
Basophils Absolute: 0 10*3/uL (ref 0–0.1)
Basophils Relative: 1 %
EOS ABS: 0.3 10*3/uL (ref 0–0.7)
EOS PCT: 4 %
HCT: 25 % — ABNORMAL LOW (ref 40.0–52.0)
Hemoglobin: 8.3 g/dL — ABNORMAL LOW (ref 13.0–18.0)
LYMPHS ABS: 1.4 10*3/uL (ref 1.0–3.6)
LYMPHS PCT: 19 %
MCH: 26.5 pg (ref 26.0–34.0)
MCHC: 33.1 g/dL (ref 32.0–36.0)
MCV: 79.8 fL — AB (ref 80.0–100.0)
MONO ABS: 0.9 10*3/uL (ref 0.2–1.0)
MONOS PCT: 13 %
Neutro Abs: 4.4 10*3/uL (ref 1.4–6.5)
Neutrophils Relative %: 63 %
PLATELETS: 259 10*3/uL (ref 150–440)
RBC: 3.13 MIL/uL — ABNORMAL LOW (ref 4.40–5.90)
RDW: 15.3 % — ABNORMAL HIGH (ref 11.5–14.5)
WBC: 7 10*3/uL (ref 3.8–10.6)

## 2016-08-28 LAB — HEPATITIS C ANTIBODY: HCV Ab: >11 s/co ratio — ABNORMAL HIGH (ref 0.0–0.9)

## 2016-08-28 LAB — COMPREHENSIVE METABOLIC PANEL
ALBUMIN: 1.8 g/dL — AB (ref 3.5–5.0)
ALT: 20 U/L (ref 17–63)
ANION GAP: 3 — AB (ref 5–15)
AST: 36 U/L (ref 15–41)
Alkaline Phosphatase: 100 U/L (ref 38–126)
BUN: 27 mg/dL — ABNORMAL HIGH (ref 6–20)
CO2: 21 mmol/L — AB (ref 22–32)
Calcium: 7.8 mg/dL — ABNORMAL LOW (ref 8.9–10.3)
Chloride: 107 mmol/L (ref 101–111)
Creatinine, Ser: 1.6 mg/dL — ABNORMAL HIGH (ref 0.61–1.24)
GFR calc non Af Amer: 44 mL/min — ABNORMAL LOW (ref >60)
GFR, EST AFRICAN AMERICAN: 51 mL/min — AB (ref >60)
GLUCOSE: 111 mg/dL — AB (ref 65–99)
POTASSIUM: 3.4 mmol/L — AB (ref 3.5–5.1)
SODIUM: 131 mmol/L — AB (ref 135–145)
Total Bilirubin: 0.3 mg/dL (ref 0.3–1.2)
Total Protein: 7.4 g/dL (ref 6.5–8.1)

## 2016-08-28 LAB — TYPE AND SCREEN
ABO/RH(D): A POS
ANTIBODY SCREEN: NEGATIVE

## 2016-08-28 LAB — HEPATITIS A ANTIBODY, TOTAL: HEP A TOTAL AB: POSITIVE — AB

## 2016-08-28 LAB — HEPATITIS B SURFACE ANTIGEN: HEP B S AG: NEGATIVE

## 2016-08-28 LAB — TROPONIN I

## 2016-08-28 LAB — CERULOPLASMIN: CERULOPLASMIN: 33.8 mg/dL — AB (ref 16.0–31.0)

## 2016-08-28 LAB — HIV ANTIBODY (ROUTINE TESTING W REFLEX): HIV SCREEN 4TH GENERATION: NONREACTIVE

## 2016-08-28 LAB — HEPATITIS B CORE ANTIBODY, IGM: HEP B C IGM: NEGATIVE

## 2016-08-28 MED ORDER — SODIUM CHLORIDE 0.9 % IV SOLN
Freq: Once | INTRAVENOUS | Status: AC
  Administered 2016-08-28: 500 mL via INTRAVENOUS

## 2016-08-28 MED ORDER — SODIUM CHLORIDE 0.9 % IV BOLUS (SEPSIS)
500.0000 mL | Freq: Once | INTRAVENOUS | Status: AC
  Administered 2016-08-28: 500 mL via INTRAVENOUS

## 2016-08-28 MED ORDER — ENALAPRILAT 1.25 MG/ML IV SOLN
0.6250 mg | Freq: Once | INTRAVENOUS | Status: AC
  Administered 2016-08-28: 0.625 mg via INTRAVENOUS
  Filled 2016-08-28: qty 2

## 2016-08-28 MED ORDER — SODIUM CHLORIDE 0.9 % IV SOLN
Freq: Once | INTRAVENOUS | Status: AC
  Administered 2016-08-28: 17:00:00 via INTRAVENOUS

## 2016-08-28 NOTE — ED Triage Notes (Signed)
Pt states he was called by his primary today and told his hemoglobin was 9. Pt states that he is not bleeding from anywhere that he knows of. PT of blood transfusions Pt alert and oriented X4, active, cooperative, pt in NAD. RR even and unlabored, color WNL.

## 2016-08-28 NOTE — Discharge Instructions (Signed)
Please follow-up with your regular doctor. I would have him check you sometime in the next couple days. Please return if you feel weak, run a fever vomiting more than once or have any other problems.

## 2016-08-28 NOTE — ED Provider Notes (Signed)
[facility] Emergency Department Provider Note   ____________________________________________   First MD Initiated Contact with Patient 08/28/16 1209     (approximate)  I have reviewed the triage vital signs and the nursing notes.   HISTORY  Chief Complaint Abnormal Lab    HPI HARSHAAN [name] is a 66 y.o. male who reports he had blood drawn yesterday and then his doctor called him and told him his hemoglobin was 9. We called his doctor as well or the nurse did the doctor said his creatinine had gone up rather rapidly and wanted him to get some fluids. Patient himself reports he feels well when I speak to him denies any chest or abdominal pain at present although he's had chest pain in the past. He says he feels fine. He just came in because he was told his lab work was abnormal   Past Medical History:  Diagnosis Date  . CHF (congestive heart failure) ([place])   . Diabetes mellitus    Patient take Metformin.  Marland Kitchen Hypertension   . Leg swelling   . Loose, teeth    PATIENT STATES "HAS 6 TEETH, SOME ARE LOOSE"  . Neuromuscular disorder ([place])    RIGHT HAND AND FINGERS NUMB  . Shortness of breath dyspnea   . 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 Chest pain with high risk for cardiac etiology 08/13/2016  . SOB (shortness of breath) on exertion 08/13/2016  . Varicose veins of both lower extremities with inflammation 06/11/2016  . Essential hypertension 06/11/2016  . Chronic venous insufficiency 05/08/2016  . Type 2 diabetes mellitus with complication, without long-term current use of insulin ([place]) 05/08/2016  . Lymphedema 04/12/2016  . Ulcer of great toe ([place]) 04/12/2016  . 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 Anemia 01/26/2016  . Skin mass   . Lymphadenopathy, inguinal   . Symptomatic anemia 01/24/2016  . Hypoglycemia 09/13/2015  . Internal hemorrhoids 12/30/2014  .  Mass of anus 11/01/2014    Past Surgical History:  Procedure Laterality Date  . COLONOSCOPY WITH PROPOFOL N/A 11/19/2014   Procedure: COLONOSCOPY WITH PROPOFOL;  Surgeon: Lucilla Lame, MD;  Location: [facility];  Service: Endoscopy;  Laterality: N/A;  DIABETIC-ORAL MEDS  . INGUINAL LYMPH NODE BIOPSY Right 01/26/2016   Procedure: INGUINAL LYMPH NODE BIOPSY;  Surgeon: Clayburn Pert, MD;  Location: [facility];  Service: General;  Laterality: Right;  . RECTAL BIOPSY N/A 11/02/2014   Procedure: BIOPSY RECTAL;  Surgeon: Marlyce Huge, MD;  Location: [facility];  Service: General;  Laterality: N/A;  . RECTAL EXAM UNDER ANESTHESIA N/A 11/02/2014   Procedure: RECTAL EXAM UNDER ANESTHESIA;  Surgeon: Marlyce Huge, MD;  Location: [facility];  Service: General;  Laterality: N/A;  . TONSILLECTOMY      Prior to Admission medications   Medication Sig Start Date End Date Taking? Authorizing Provider  albuterol (PROVENTIL HFA;VENTOLIN HFA) 108 (90 Base) MCG/ACT inhaler Inhale into the lungs.   Yes [provider]  amLODipine (NORVASC) 10 MG tablet Take 1 tablet (10 mg total) by mouth daily. 01/26/16  Yes Sudini, Alveta Heimlich, MD  atorvastatin (LIPITOR) 40 MG tablet Take 40 mg by mouth at bedtime.   Yes [provider]  hydrOXYzine (ATARAX/VISTARIL) 25 MG tablet Take 25 mg by mouth 3 times daily as needed. 02/24/16  Yes [provider]  isosorbide mononitrate (IMDUR) 30 MG 24 hr tablet  Take 30 mg by mouth daily.  08/13/16 08/13/17 Yes [provider]  lisinopril (PRINIVIL,ZESTRIL) 40 MG tablet Take 40 mg by mouth once daily. 02/24/16  Yes [provider]  metFORMIN (GLUCOPHAGE) 1000 MG tablet Take 1,000 mg by mouth 2 (two) times daily with a meal.   Yes [provider]  spironolactone (ALDACTONE) 25 MG tablet Take 25 mg by mouth daily.    Yes [provider]  torsemide (DEMADEX) 20 MG tablet Take 20 mg by mouth daily.    Yes [provider]  aspirin EC 81 MG tablet Take 81 mg by mouth daily.    [provider]  HYDROcodone-acetaminophen (NORCO) 5-325 MG tablet Take 1 tablet by mouth every 6 (six) hours as needed for moderate pain. Patient not taking: Reported on 08/23/2016 03/29/16   Noreene Filbert, MD  silver sulfADIAZINE (SILVADENE) 1 % cream Apply 1 application topically 2 (two) times daily. Patient not taking: Reported on 08/28/2016 03/29/16   Noreene Filbert, MD    Allergies Patient has no known allergies.  Family History  Problem Relation Age of Onset  . Asthma Mother     Social History Social History  Substance Use Topics  . Smoking status: Current Every Day Smoker    Packs/day: 0.25    Years: 20.00    Types: Cigarettes  . Smokeless tobacco: Never Used  . Alcohol use Yes     Comment: States that he drinks 1 quart beer. daily (No ETOH in last 3 days)    Review of Systems  Constitutional: No fever/chills Eyes: No visual changes. ENT: No sore throat. Cardiovascular: Denies chest pain. Respiratory: Denies shortness of breath. Gastrointestinal: No abdominal pain.  No nausea, no vomiting.  No diarrhea.  No constipation. Genitourinary: Negative for dysuria. Musculoskeletal: Negative for back pain. Skin: Negative for rash. Neurological: Negative for headaches, focal weakness or numbness.   ____________________________________________   PHYSICAL EXAM:  VITAL SIGNS: ED Triage Vitals  Enc Vitals Group     BP 08/28/16 1139 (!) 172/95     Pulse Rate 08/28/16 1139 88     Resp 08/28/16 1139 18     Temp 08/28/16 1143 98.6 F (37 C)     Temp Source 08/28/16 1139 Oral     SpO2 08/28/16 1139 100 %     Weight 08/28/16 1140 170 lb (77.1 kg)     Height 08/28/16 1140 5\' 9"  (1.753 m)     Head Circumference --      Peak Flow --      Pain Score --      Pain Loc --      Pain Edu? --      Excl. in [place]? --     Constitutional: Alert and oriented. Well appearing and in no acute  distress. Eyes: Conjunctivae are normal. PERRL. EOMI. Head: Atraumatic. Nose: No congestion/rhinnorhea. Mouth/Throat: Mucous membranes are moist.  Oropharynx non-erythematous. Neck: No stridor.  Cardiovascular: Normal rate, regular rhythm. Grossly normal heart sounds.  Good peripheral circulation. Respiratory: Normal respiratory effort.  No retractions. Lungs CTAB. Gastrointestinal: Soft and nontender. No distention. No abdominal bruits. No CVA tenderness Musculoskeletal: No lower extremity tenderness,  +  edema.  No joint effusions. Neurologic:  Normal speech and language. No gross focal neurologic deficits are appreciated. No gait instability. Skin:  Skin is warm, dry and intact. No rash noted. Psychiatric: Mood and affect are normal. Speech and behavior are normal.  ____________________________________________   LABS (all labs ordered are listed, but only  abnormal results are displayed)  Labs Reviewed  COMPREHENSIVE METABOLIC PANEL - Abnormal; Notable for the following:       Result Value   Sodium 131 (*)    Potassium 3.4 (*)    CO2 21 (*)    Glucose, Bld 111 (*)    BUN 27 (*)    Creatinine, Ser 1.60 (*)    Calcium 7.8 (*)    Albumin 1.8 (*)    GFR calc non Af Amer 44 (*)    GFR calc Af Amer 51 (*)    Anion gap 3 (*)    All other components within normal limits  CBC - Abnormal; Notable for the following:    RBC 3.31 (*)    Hemoglobin 8.8 (*)    HCT 26.3 (*)    MCV 79.5 (*)    RDW 15.2 (*)    All other components within normal limits  BRAIN NATRIURETIC PEPTIDE - Abnormal; Notable for the following:    B Natriuretic Peptide 775.0 (*)    All other components within normal limits  BASIC METABOLIC PANEL - Abnormal; Notable for the following:    Sodium 132 (*)    Potassium 3.2 (*)    Glucose, Bld 118 (*)    BUN 23 (*)    Creatinine, Ser 1.62 (*)    Calcium 7.6 (*)    GFR calc non Af Amer 43 (*)    GFR calc Af Amer 50 (*)    Anion gap 2 (*)    All other components  within normal limits  CBC WITH DIFFERENTIAL/PLATELET - Abnormal; Notable for the following:    RBC 3.13 (*)    Hemoglobin 8.3 (*)    HCT 25.0 (*)    MCV 79.8 (*)    RDW 15.3 (*)    All other components within normal limits  TROPONIN I  TYPE AND SCREEN   ____________________________________________  EKG   ____________________________________________  RADIOLOGY   ____________________________________________   PROCEDURES  Procedure(s) performed:  Procedures  Critical Care performed:   ____________________________________________   INITIAL IMPRESSION / ASSESSMENT AND PLAN / ED COURSE  Pertinent labs & imaging results that were available during my care of the patient were reviewed by me and considered in my medical decision making (see chart for details).   On discharge patient feels well creatinine has decreased although not back to baseline patient will see his doctor tomorrow.     ____________________________________________   FINAL CLINICAL IMPRESSION(S) / ED DIAGNOSES  Final diagnoses:  Dehydration      NEW MEDICATIONS STARTED DURING THIS VISIT:  Discharge Medication List as of 08/28/2016  5:34 PM       Note:  This document was prepared using Dragon voice recognition software and may include unintentional dictation errors.    Nena Polio, MD 08/28/16 2036

## 2016-08-28 NOTE — ED Notes (Signed)
Patient given ice cream per Dr. Cinda Quest.

## 2016-08-29 ENCOUNTER — Other Ambulatory Visit: Payer: Self-pay

## 2016-08-29 ENCOUNTER — Other Ambulatory Visit (INDEPENDENT_AMBULATORY_CARE_PROVIDER_SITE_OTHER): Payer: Self-pay | Admitting: Vascular Surgery

## 2016-08-29 DIAGNOSIS — I83893 Varicose veins of bilateral lower extremities with other complications: Secondary | ICD-10-CM

## 2016-08-29 DIAGNOSIS — K7030 Alcoholic cirrhosis of liver without ascites: Secondary | ICD-10-CM

## 2016-08-29 DIAGNOSIS — K7031 Alcoholic cirrhosis of liver with ascites: Secondary | ICD-10-CM

## 2016-08-29 LAB — IMMUNOGLOBULINS A/E/G/M, SERUM
IGE (IMMUNOGLOBULIN E), SERUM: 293 [IU]/mL — AB (ref 0–100)
IGG (IMMUNOGLOBIN G), SERUM: 2861 mg/dL — AB (ref 700–1600)
IGM, SERUM: 150 mg/dL (ref 20–172)
IgA: 424 mg/dL (ref 61–437)

## 2016-08-29 LAB — HEPATITIS B E ANTIBODY: HEP B E AB: NEGATIVE

## 2016-08-29 MED ORDER — ALBUMIN HUMAN 25 % IV SOLN: 50.0000 g | Freq: Once | INTRAVENOUS | Status: DC

## 2016-08-30 ENCOUNTER — Ambulatory Visit (INDEPENDENT_AMBULATORY_CARE_PROVIDER_SITE_OTHER): Payer: Medicare HMO

## 2016-08-30 ENCOUNTER — Telehealth: Payer: Self-pay

## 2016-08-30 ENCOUNTER — Other Ambulatory Visit: Payer: Self-pay

## 2016-08-30 DIAGNOSIS — I83893 Varicose veins of bilateral lower extremities with other complications: Secondary | ICD-10-CM | POA: Diagnosis not present

## 2016-08-30 DIAGNOSIS — K7031 Alcoholic cirrhosis of liver with ascites: Secondary | ICD-10-CM

## 2016-08-30 NOTE — Telephone Encounter (Signed)
-----   Message from Jonathon Bellows, MD sent at 08/29/2016 11:22 AM EDT ----- He has hepatitis C- advise to stop all alcohol consumption and when he has done so will plan to treat him

## 2016-08-30 NOTE — Telephone Encounter (Signed)
Unable to lvm to advise patient of results and Dr. Georgeann Oppenheim recommendation. Will callback.  He has hepatitis C- advise to stop all alcohol consumption and when he has done so will plan to treat him

## 2016-08-30 NOTE — Telephone Encounter (Signed)
Advised [name] of paracentesis scheduling.   He advised me to contact [name] with the information.   [name] states that [name] has an ECHO on the same date at 9am.   Paracentesis Scheduled for: Friday, June 1st @ 1230pm @ [facility].

## 2016-09-07 ENCOUNTER — Ambulatory Visit: Admission: RE | Admit: 2016-09-07 | Payer: Medicare HMO | Source: Ambulatory Visit

## 2016-09-12 ENCOUNTER — Telehealth: Payer: Self-pay

## 2016-09-12 ENCOUNTER — Other Ambulatory Visit: Payer: Self-pay

## 2016-09-12 DIAGNOSIS — K7031 Alcoholic cirrhosis of liver with ascites: Secondary | ICD-10-CM

## 2016-09-12 DIAGNOSIS — D509 Iron deficiency anemia, unspecified: Secondary | ICD-10-CM

## 2016-09-12 DIAGNOSIS — R590 Localized enlarged lymph nodes: Secondary | ICD-10-CM

## 2016-09-12 NOTE — Telephone Encounter (Signed)
-----   Message from Jonathon Bellows, MD sent at 09/12/2016  1:21 PM EDT ----- Dr Deatra Robinson  I saw the patient on 5/21- I Ordered a lot of tests , he would need to have those tests to be completed and once he has an EGD , will plan for treatment as I would not wish for him to miss even a single dose of Hepatitis C treatment once started.  I will have my secretary check on the status of his tests  Regards  Kiran  ----- Message ----- From: McLean-Scocuzza, Nino Glow, MD Sent: 09/12/2016  12:55 PM To: Lucilla Lame, MD, Jonathon Bellows, MD  Hi   This is our mutual patient. I just want to know when hep C treatment will be started?  Thanks [employer]

## 2016-09-12 NOTE — Telephone Encounter (Signed)
Spoke to patient who advised that all appointments should be scheduled with Mr. Leotis Shames.   Pt was unaware that he had missed the paracentesis appt.   Contacted Mr. Arvella Merles and rescheduled paracentesis and EGD/colonoscopy.   Gastroenterology Pre-Procedure Review  Request Date: 6/28 Requesting Physician: [name]  PATIENT REVIEW QUESTIONS: The patient responded to the following health history questions as indicated:    1. Are you having any GI issues? yes (IDA, Cirrhosis, Abd Lymphadenopathy) 2. Do you have a personal history of Polyps? no 3. Do you have a family history of Colon Cancer or Polyps? no 4. Diabetes Mellitus? no 5. Joint replacements in the past 12 months?no 6. Major health problems in the past 3 months?yes (Chest Pain) 7. Any artificial heart valves, MVP, or defibrillator?no    MEDICATIONS & ALLERGIES:    Patient reports the following regarding taking any anticoagulation/antiplatelet therapy:   Plavix, Coumadin, Eliquis, Xarelto, Lovenox, Pradaxa, Brilinta, or Effient? no Aspirin? yes (81mg )  Patient confirms/reports the following medications:  Current Outpatient Prescriptions  Medication Sig Dispense Refill  . albuterol (PROVENTIL HFA;VENTOLIN HFA) 108 (90 Base) MCG/ACT inhaler Inhale into the lungs.    Marland Kitchen amLODipine (NORVASC) 10 MG tablet Take 1 tablet (10 mg total) by mouth daily. 30 tablet 0  . aspirin EC 81 MG tablet Take 81 mg by mouth daily.    Marland Kitchen atorvastatin (LIPITOR) 40 MG tablet Take 40 mg by mouth at bedtime.    Marland Kitchen HYDROcodone-acetaminophen (NORCO) 5-325 MG tablet Take 1 tablet by mouth every 6 (six) hours as needed for moderate pain. (Patient not taking: Reported on 08/23/2016) 30 tablet 0  . hydrOXYzine (ATARAX/VISTARIL) 25 MG tablet Take 25 mg by mouth 3 times daily as needed.    . isosorbide mononitrate (IMDUR) 30 MG 24 hr tablet Take 30 mg by mouth daily.     Marland Kitchen lisinopril (PRINIVIL,ZESTRIL) 40 MG tablet Take 40 mg by mouth once daily.    . metFORMIN  (GLUCOPHAGE) 1000 MG tablet Take 1,000 mg by mouth 2 (two) times daily with a meal.    . silver sulfADIAZINE (SILVADENE) 1 % cream Apply 1 application topically 2 (two) times daily. (Patient not taking: Reported on 08/28/2016) 50 g 2  . spironolactone (ALDACTONE) 25 MG tablet Take 25 mg by mouth daily.     Marland Kitchen torsemide (DEMADEX) 20 MG tablet Take 20 mg by mouth daily.      Current Facility-Administered Medications  Medication Dose Route Frequency Provider Last Rate Last Dose  . albumin human 25 % solution 50 g  50 g Intravenous Once Jonathon Bellows, MD        Patient confirms/reports the following allergies:  No Known Allergies  No orders of the defined types were placed in this encounter.   AUTHORIZATION INFORMATION Primary Insurance: 1D#: Group #:  Secondary Insurance: 1D#: Group #:  SCHEDULE INFORMATION: Date: 6/28 Time: Location: [place]

## 2016-09-17 ENCOUNTER — Ambulatory Visit
Admission: RE | Admit: 2016-09-17 | Discharge: 2016-09-17 | Disposition: A | Discharge status: Home / Self Care | Payer: Medicare HMO | Source: Ambulatory Visit | Attending: Gastroenterology | Admitting: Gastroenterology

## 2016-09-17 DIAGNOSIS — K7031 Alcoholic cirrhosis of liver with ascites: Secondary | ICD-10-CM | POA: Diagnosis present

## 2016-09-17 LAB — LACTATE DEHYDROGENASE, PLEURAL OR PERITONEAL FLUID: LD FL: 56 U/L — AB (ref 3–23)

## 2016-09-17 LAB — ALBUMIN, PLEURAL OR PERITONEAL FLUID

## 2016-09-17 LAB — BODY FLUID CELL COUNT WITH DIFFERENTIAL
EOS FL: 0 %
Lymphs, Fluid: 32 %
Monocyte-Macrophage-Serous Fluid: 33 %
NEUTROPHIL FLUID: 35 %
OTHER CELLS FL: 0 %
WBC FLUID: 525 uL

## 2016-09-17 LAB — PROTEIN, PLEURAL OR PERITONEAL FLUID: Total protein, fluid: <3 g/dL

## 2016-09-17 NOTE — Procedures (Signed)
Ultrasound-guided diagnostic and therapeutic paracentesis performed yielding 2.2 liters of serous colored fluid. No immediate complications.  Karalyne Nusser E 1:28 PM 09/17/2016

## 2016-09-18 ENCOUNTER — Other Ambulatory Visit: Payer: Self-pay

## 2016-09-18 ENCOUNTER — Telehealth: Payer: Self-pay | Admitting: *Deleted

## 2016-09-18 ENCOUNTER — Inpatient Hospital Stay: Payer: Medicare HMO | Attending: Oncology | Admitting: Oncology

## 2016-09-18 ENCOUNTER — Encounter: Payer: Self-pay | Admitting: Oncology

## 2016-09-18 ENCOUNTER — Inpatient Hospital Stay: Payer: Medicare HMO

## 2016-09-18 VITALS — BP 162/81 | HR 72 | Temp 94.4°F | Resp 18 | Ht 71.65 in | Wt 167.6 lb

## 2016-09-18 DIAGNOSIS — E11649 Type 2 diabetes mellitus with hypoglycemia without coma: Secondary | ICD-10-CM | POA: Diagnosis not present

## 2016-09-18 DIAGNOSIS — Z85828 Personal history of other malignant neoplasm of skin: Secondary | ICD-10-CM | POA: Diagnosis not present

## 2016-09-18 DIAGNOSIS — Z7982 Long term (current) use of aspirin: Secondary | ICD-10-CM | POA: Diagnosis not present

## 2016-09-18 DIAGNOSIS — I872 Venous insufficiency (chronic) (peripheral): Secondary | ICD-10-CM | POA: Diagnosis not present

## 2016-09-18 DIAGNOSIS — K7031 Alcoholic cirrhosis of liver with ascites: Secondary | ICD-10-CM | POA: Diagnosis not present

## 2016-09-18 DIAGNOSIS — D509 Iron deficiency anemia, unspecified: Secondary | ICD-10-CM | POA: Diagnosis not present

## 2016-09-18 DIAGNOSIS — Z7984 Long term (current) use of oral hypoglycemic drugs: Secondary | ICD-10-CM | POA: Insufficient documentation

## 2016-09-18 DIAGNOSIS — F17210 Nicotine dependence, cigarettes, uncomplicated: Secondary | ICD-10-CM | POA: Diagnosis not present

## 2016-09-18 DIAGNOSIS — R590 Localized enlarged lymph nodes: Secondary | ICD-10-CM | POA: Diagnosis not present

## 2016-09-18 DIAGNOSIS — I110 Hypertensive heart disease with heart failure: Secondary | ICD-10-CM | POA: Insufficient documentation

## 2016-09-18 DIAGNOSIS — K219 Gastro-esophageal reflux disease without esophagitis: Secondary | ICD-10-CM | POA: Diagnosis not present

## 2016-09-18 DIAGNOSIS — Z79899 Other long term (current) drug therapy: Secondary | ICD-10-CM | POA: Diagnosis not present

## 2016-09-18 DIAGNOSIS — I509 Heart failure, unspecified: Secondary | ICD-10-CM | POA: Diagnosis not present

## 2016-09-18 DIAGNOSIS — K648 Other hemorrhoids: Secondary | ICD-10-CM | POA: Insufficient documentation

## 2016-09-18 DIAGNOSIS — I890 Lymphedema, not elsewhere classified: Secondary | ICD-10-CM | POA: Insufficient documentation

## 2016-09-18 DIAGNOSIS — Z923 Personal history of irradiation: Secondary | ICD-10-CM | POA: Insufficient documentation

## 2016-09-18 LAB — CBC WITH DIFFERENTIAL/PLATELET
Basophils Absolute: 0.1 10*3/uL (ref 0–0.1)
Basophils Relative: 1 %
Eosinophils Absolute: 0.3 10*3/uL (ref 0–0.7)
Eosinophils Relative: 4 %
HEMATOCRIT: 19.8 % — AB (ref 40.0–52.0)
Hemoglobin: 6.5 g/dL — ABNORMAL LOW (ref 13.0–18.0)
LYMPHS ABS: 1.3 10*3/uL (ref 1.0–3.6)
LYMPHS PCT: 20 %
MCH: 28.7 pg (ref 26.0–34.0)
MCHC: 33.1 g/dL (ref 32.0–36.0)
MCV: 86.7 fL (ref 80.0–100.0)
MONO ABS: 0.7 10*3/uL (ref 0.2–1.0)
MONOS PCT: 10 %
NEUTROS ABS: 4.4 10*3/uL (ref 1.4–6.5)
Neutrophils Relative %: 65 %
Platelets: 358 10*3/uL (ref 150–440)
RBC: 2.28 MIL/uL — ABNORMAL LOW (ref 4.40–5.90)
RDW: 18.7 % — AB (ref 11.5–14.5)
WBC: 6.8 10*3/uL (ref 3.8–10.6)

## 2016-09-18 LAB — COMPREHENSIVE METABOLIC PANEL
ALT: 19 U/L (ref 17–63)
AST: 38 U/L (ref 15–41)
Albumin: 1.9 g/dL — ABNORMAL LOW (ref 3.5–5.0)
Alkaline Phosphatase: 65 U/L (ref 38–126)
Anion gap: 6 (ref 5–15)
BILIRUBIN TOTAL: 0.4 mg/dL (ref 0.3–1.2)
BUN: 21 mg/dL — AB (ref 6–20)
CALCIUM: 8.3 mg/dL — AB (ref 8.9–10.3)
CO2: 25 mmol/L (ref 22–32)
Chloride: 98 mmol/L — ABNORMAL LOW (ref 101–111)
Creatinine, Ser: 1.63 mg/dL — ABNORMAL HIGH (ref 0.61–1.24)
GFR calc Af Amer: 50 mL/min — ABNORMAL LOW (ref >60)
GFR calc non Af Amer: 43 mL/min — ABNORMAL LOW (ref >60)
Glucose, Bld: 109 mg/dL — ABNORMAL HIGH (ref 65–99)
POTASSIUM: 4.3 mmol/L (ref 3.5–5.1)
Sodium: 129 mmol/L — ABNORMAL LOW (ref 135–145)
TOTAL PROTEIN: 7.4 g/dL (ref 6.5–8.1)

## 2016-09-18 LAB — VITAMIN B12: Vitamin B-12: 342 pg/mL (ref 180–914)

## 2016-09-18 LAB — FOLATE: Folate: 10.2 ng/mL (ref >5.9)

## 2016-09-18 LAB — TSH: TSH: 5.766 u[IU]/mL — AB (ref 0.350–4.500)

## 2016-09-18 LAB — RETICULOCYTES
RBC.: 2.31 MIL/uL — ABNORMAL LOW (ref 4.40–5.90)
RETIC COUNT ABSOLUTE: 182.5 10*3/uL (ref 19.0–183.0)
RETIC CT PCT: 7.9 % — AB (ref 0.4–3.1)

## 2016-09-18 LAB — IRON AND TIBC
IRON: 25 ug/dL — AB (ref 45–182)
Saturation Ratios: 9 % — ABNORMAL LOW (ref 17.9–39.5)
TIBC: 294 ug/dL (ref 250–450)
UIBC: 269 ug/dL

## 2016-09-18 LAB — MISC LABCORP TEST (SEND OUT): Labcorp test code: 19588

## 2016-09-18 LAB — FERRITIN: FERRITIN: 40 ng/mL (ref 24–336)

## 2016-09-18 NOTE — Telephone Encounter (Signed)
-----   Message from Sindy Guadeloupe, MD sent at 09/18/2016 11:32 AM EDT ----- Pt needs blood transfusion today or tomorrow. Please let him know

## 2016-09-18 NOTE — Progress Notes (Signed)
Pt here as new patient evaluation. Last saw Dr Donella Stade Feb/2018 -had 25 radiation tx per pt

## 2016-09-18 NOTE — Telephone Encounter (Signed)
Pt contacted and I spoke to him about the need for blood transfusion. He is ok with it but he wants me to call his family member. He gave me his cell phone earlier today and the phone had no answer and the voicemail was full.  I then called the home phone for family and wilbur answered and he will bring pt over for labs and 1 unit of blood tom . Be here at 9 am

## 2016-09-18 NOTE — Progress Notes (Signed)
Pt needs blood transfusion today or tomorrow. Please let him know

## 2016-09-19 ENCOUNTER — Inpatient Hospital Stay: Payer: Medicare HMO

## 2016-09-19 ENCOUNTER — Other Ambulatory Visit: Payer: Self-pay | Admitting: *Deleted

## 2016-09-19 ENCOUNTER — Encounter: Payer: Self-pay | Admitting: Oncology

## 2016-09-19 ENCOUNTER — Telehealth: Payer: Self-pay

## 2016-09-19 ENCOUNTER — Other Ambulatory Visit: Payer: Self-pay

## 2016-09-19 DIAGNOSIS — D509 Iron deficiency anemia, unspecified: Secondary | ICD-10-CM | POA: Insufficient documentation

## 2016-09-19 DIAGNOSIS — K7031 Alcoholic cirrhosis of liver with ascites: Secondary | ICD-10-CM

## 2016-09-19 LAB — MULTIPLE MYELOMA PANEL, SERUM
ALBUMIN SERPL ELPH-MCNC: 2 g/dL — AB (ref 2.9–4.4)
Albumin/Glob SerPl: 0.5 — ABNORMAL LOW (ref 0.7–1.7)
Alpha 1: 0.3 g/dL (ref 0.0–0.4)
Alpha2 Glob SerPl Elph-Mcnc: 0.8 g/dL (ref 0.4–1.0)
B-Globulin SerPl Elph-Mcnc: 1.1 g/dL (ref 0.7–1.3)
GAMMA GLOB SERPL ELPH-MCNC: 2.7 g/dL — AB (ref 0.4–1.8)
GLOBULIN, TOTAL: 5 g/dL — AB (ref 2.2–3.9)
IGA: 416 mg/dL (ref 61–437)
IgG (Immunoglobin G), Serum: 3156 mg/dL — ABNORMAL HIGH (ref 700–1600)
IgM, Serum: 105 mg/dL (ref 20–172)
M Protein SerPl Elph-Mcnc: 0.9 g/dL — ABNORMAL HIGH
TOTAL PROTEIN ELP: 7 g/dL (ref 6.0–8.5)

## 2016-09-19 LAB — PREPARE RBC (CROSSMATCH)

## 2016-09-19 LAB — HAPTOGLOBIN: Haptoglobin: 178 mg/dL (ref 34–200)

## 2016-09-19 MED ORDER — SODIUM CHLORIDE 0.9 % IV SOLN
250.0000 mL | Freq: Once | INTRAVENOUS | Status: AC
  Administered 2016-09-19: 250 mL via INTRAVENOUS
  Filled 2016-09-19: qty 250

## 2016-09-19 MED ORDER — ACETAMINOPHEN 325 MG PO TABS
650.0000 mg | ORAL_TABLET | Freq: Once | ORAL | Status: AC
  Administered 2016-09-19: 650 mg via ORAL
  Filled 2016-09-19: qty 2

## 2016-09-19 MED ORDER — ALBUMIN HUMAN 25 % IV SOLN: 50.0000 g | Freq: Once | INTRAVENOUS | Status: DC

## 2016-09-19 NOTE — Telephone Encounter (Signed)
Faxed documentation to [facility] @ [place] to schedule pre-op paracentesis per [name].

## 2016-09-19 NOTE — Progress Notes (Signed)
Hematology/Oncology Consult note [facility] Telephone:([phone] [phone]  Patient Care Team: [place] as PCP - General   Name of the patient: [name]  [id number]  [birth year]    Reason for referral- intra abdominal adenopathy   Referring physician- Dr. Jonathon Bellows  Date of visit: 09/19/16   History of presenting illness- 1. 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. 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 Baruch Gouty. Patient had CT abdomen in July 2016 which showed retroperitoneal, celiac and gastric adenopathy. MR pelvis in Oct 2017 showed b/l inguinal and external iliac adenopathy as well. Inguinal LN biopsy was negative for malignancy.   2. Biopsy of the skin lesion showed: DIAGNOSIS:  A. PERIANAL POLYP, LEFT; EXCISION:  - SKIN WITH POLYPOID VASCULAR PROLIFERATION WITH SUPERFICIAL NECROSIS,  SEE COMMENT.   B. PERIANAL MASS, RIGHT; INCISIONAL BIOPSY:  - INVASIVE CARCINOMA WITH BASALOID FEATURES.  - SEE COMMENT.   Comment #1:  The differential diagnosis for the perianal polyp includes pyogenic  granuloma and ulcerated hemangioma.   Comment #2:  Immunohistochemical stains were performed. The carcinoma is positive for  p16, BerEp4, and BCL-2. Stain controls worked appropriately.  Unfortunately the pattern of staining does not assist with further  classification of the carcinoma. Clinical correlation is required to  determine if the lesion involves perianal skin primarily (BCC) or is  arising in the anal canal (squamous cell carcinoma with basaloid  features).    This was followed by RT to the lesion.  3. He had repeat CT abdomen in feb 2018 which showed: IMPRESSION: Progressive hepatic cirrhosis since prior study. No evidence of hepatic neoplasm.  New moderate ascites, diffuse mesenteric and body wall  edema, and small bilateral pleural effusions. Mild diffuse small bowel wall thickening, consistent with hypoalbuminemia.  Findings consistent with portal venous hypertension, including mild splenomegaly and upper abdominal venous collaterals.  Stable mild abdominal lymphadenopathy. Mild increase in bilateral iliac and inguinal lymphadenopathy in the pelvis, which may be reactive in etiology, with metastatic disease considered less Likely.  4. Ascites recently drained. He is get egd and colonoscopy for his microcytic anemia  ECOG PS- 2  Pain scale- 0   Review of systems- Review of Systems  Constitutional: Positive for malaise/fatigue. Negative for chills, fever and weight loss.  HENT: Negative for congestion, ear discharge and nosebleeds.   Eyes: Negative for blurred vision.  Respiratory: Negative for cough, hemoptysis, sputum production, shortness of breath and wheezing.   Cardiovascular: Negative for chest pain, palpitations, orthopnea and claudication.  Gastrointestinal: Positive for abdominal pain. Negative for blood in stool, constipation, diarrhea, heartburn, melena, nausea and vomiting.  Genitourinary: Negative for dysuria, flank pain, frequency, hematuria and urgency.  Musculoskeletal: Negative for back pain, joint pain and myalgias.  Skin: Negative for rash.  Neurological: Negative for dizziness, tingling, focal weakness, seizures, weakness and headaches.  Endo/Heme/Allergies: Does not bruise/bleed easily.  Psychiatric/Behavioral: Negative for depression and suicidal ideas. The patient does not have insomnia.     No Known Allergies  Patient Active Problem List   Diagnosis Date Noted  . Chest pain with high risk for cardiac etiology 08/13/2016  . SOB (shortness of breath) on exertion 08/13/2016  . Varicose veins of both lower extremities with inflammation 06/11/2016  . Essential hypertension 06/11/2016  . Chronic venous insufficiency 05/08/2016  . Type 2 diabetes  mellitus with complication, without long-term current use of insulin ([place]) 05/08/2016  . Lymphedema 04/12/2016  . Ulcer of great toe ([place]) 04/12/2016  . 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 Anemia 01/26/2016  . Skin mass   . Lymphadenopathy, inguinal   . Symptomatic anemia 01/24/2016  . Hypoglycemia 09/13/2015  . Internal hemorrhoids 12/30/2014  . Mass of anus 11/01/2014     Past Medical History:  Diagnosis Date  . CHF (congestive heart failure) ([place])   . Diabetes mellitus    Patient take Metformin.  Marland Kitchen GERD (gastroesophageal reflux disease)   . Hypertension   . Leg swelling   . Loose, teeth    PATIENT STATES "HAS 6 TEETH, SOME ARE LOOSE"  . Neuromuscular disorder ([place])    RIGHT HAND AND FINGERS NUMB  . Shortness of breath dyspnea   . 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 02/24/2016     Past Surgical History:  Procedure Laterality Date  . APPENDECTOMY    . COLONOSCOPY WITH PROPOFOL N/A 11/19/2014   Procedure: COLONOSCOPY WITH PROPOFOL;  Surgeon: Lucilla Lame, MD;  Location: [facility];  Service: Endoscopy;  Laterality: N/A;  DIABETIC-ORAL MEDS  . INGUINAL LYMPH NODE BIOPSY Right 01/26/2016   Procedure: INGUINAL LYMPH NODE BIOPSY;  Surgeon: Clayburn Pert, MD;  Location: [facility];  Service: General;  Laterality: Right;  . RECTAL BIOPSY N/A 11/02/2014   Procedure: BIOPSY RECTAL;  Surgeon: Marlyce Huge, MD;  Location: [facility];  Service: General;  Laterality: N/A;  . RECTAL EXAM UNDER ANESTHESIA N/A 11/02/2014   Procedure: RECTAL EXAM UNDER ANESTHESIA;  Surgeon: Marlyce Huge, MD;  Location: [facility];  Service: General;  Laterality: N/A;  . TONSILLECTOMY      Social History   Social History  . Marital status: Divorced    Spouse name: N/A  . Number of children: N/A  . Years of education: N/A   Occupational History  . Not on file.   Social History Main Topics  . Smoking status: Current Every Day Smoker    Packs/day: 0.25     Years: 20.00    Types: Cigarettes  . Smokeless tobacco: Never Used     Comment: pt claims no smoking x 3 d  . Alcohol use No     Comment: per pt used to drink beer daily- quit may/2018  . Drug use: Yes    Types: Marijuana     Comment: Last Use 2016 per patient  . Sexual activity: Not on file   Other Topics Concern  . Not on file   Social History Narrative  . No narrative on file     Family History  Problem Relation Age of Onset  . Asthma Mother      Current Outpatient Prescriptions:  .  albuterol (PROVENTIL HFA;VENTOLIN HFA) 108 (90 Base) MCG/ACT inhaler, Inhale into the lungs., Disp: , Rfl:  .  amLODipine (NORVASC) 10 MG tablet, Take 1 tablet (10 mg total) by mouth daily., Disp: 30 tablet, Rfl: 0 .  aspirin EC 81 MG tablet, Take 81 mg by mouth daily., Disp: , Rfl:  .  atorvastatin (LIPITOR) 40 MG tablet, Take 40 mg by mouth at bedtime., Disp: , Rfl:  .  HYDROcodone-acetaminophen (NORCO) 5-325 MG tablet, Take 1 tablet by mouth every 6 (six) hours as needed for moderate pain., Disp: 30 tablet, Rfl: 0 .  hydrOXYzine (ATARAX/VISTARIL) 25 MG tablet, Take 25 mg by mouth 3 times daily as needed., Disp: , Rfl:  .  isosorbide mononitrate (IMDUR) 30 MG 24 hr tablet, Take 30 mg by mouth daily. , Disp: , Rfl:  .  lisinopril (PRINIVIL,ZESTRIL) 40 MG tablet, Take 40 mg by mouth once daily., Disp: , Rfl:  .  metFORMIN (GLUCOPHAGE) 1000 MG tablet, Take 1,000 mg by mouth 2 (two) times daily with a meal., Disp: , Rfl:  .  silver sulfADIAZINE (SILVADENE) 1 % cream, Apply 1 application topically 2 (two) times daily. (Patient not taking: Reported on 08/28/2016), Disp: 50 g, Rfl: 2 .  spironolactone (ALDACTONE) 25 MG tablet, Take 25 mg by mouth daily. , Disp: , Rfl:  .  torsemide (DEMADEX) 20 MG tablet, Take 20 mg by mouth daily. , Disp: , Rfl:   Current Facility-Administered Medications:  .  albumin human 25 % solution 50 g, 50 g, Intravenous, Once, Jonathon Bellows, MD .  albumin human 25 % solution  50 g, 50 g, Intravenous, Once, Jonathon Bellows, MD   Physical exam:  Vitals:   09/18/16 1029  BP: (!) 162/81  Pulse: 72  Resp: 18  Temp: (!) 94.4 F (34.7 C)  TempSrc: Tympanic  Weight: 167 lb 9.6 oz (76 kg)  Height: 5' 11.65" (1.82 m)   Physical Exam  Constitutional: He is oriented to person, place, and time and well-developed, well-nourished, and in no distress.  HENT:  Head: Normocephalic and atraumatic.  Eyes: EOM are normal. Pupils are equal, round, and reactive to light.  Neck: Normal range of motion.  Cardiovascular: Normal rate, regular rhythm and normal heart sounds.   Pulmonary/Chest: Effort normal and breath sounds normal.  Abdominal:  Distended, ascites positive  Musculoskeletal: He exhibits edema.  Neurological: He is alert and oriented to person, place, and time.  Skin:  Skin over b/l extermities is thickened and Whan.        CMP Latest Ref Rng & Units 09/18/2016  Glucose 65 - 99 mg/dL 109(H)  BUN 6 - 20 mg/dL 21(H)  Creatinine 0.61 - 1.24 mg/dL 1.63(H)  Sodium 135 - 145 mmol/L 129(L)  Potassium 3.5 - 5.1 mmol/L 4.3  Chloride 101 - 111 mmol/L 98(L)  CO2 22 - 32 mmol/L 25  Calcium 8.9 - 10.3 mg/dL 8.3(L)  Total Protein 6.5 - 8.1 g/dL 7.4  Total Bilirubin 0.3 - 1.2 mg/dL 0.4  Alkaline Phos 38 - 126 U/L 65  AST 15 - 41 U/L 38  ALT 17 - 63 U/L 19   CBC Latest Ref Rng & Units 09/18/2016  WBC 3.8 - 10.6 K/uL 6.8  Hemoglobin 13.0 - 18.0 g/dL 6.5(L)  Hematocrit 40.0 - 52.0 % 19.8(L)  Platelets 150 - 440 K/uL 358    No images are attached to the encounter.  US Paracentesis  Result Date: 09/17/2016 INDICATION: Patient with history of alcoholic cirrhosis and ascites. Request is made for diagnostic and therapeutic paracentesis. EXAM: ULTRASOUND GUIDED DIAGNOSTIC AND THERAPEUTIC PARACENTESIS MEDICATIONS: 1% lidocaine COMPLICATIONS: None immediate. PROCEDURE: Informed written consent was obtained from the patient after a discussion of the risks, benefits and  alternatives to treatment. A timeout was performed prior to the initiation of the procedure. Initial ultrasound scanning demonstrates a small amount of ascites within the right lower abdominal quadrant. The right upper abdomen was prepped and draped in the usual sterile fashion. 1% lidocaine was used for local anesthesia. Following this, a 19 gauge, 7-cm, Yueh catheter was introduced. An ultrasound image was saved for documentation purposes. The paracentesis was performed. The catheter was removed and a dressing was applied. The patient tolerated the procedure  well without immediate post procedural complication. FINDINGS: A total of approximately 2.2 L of serous fluid was removed. Samples were sent to the laboratory as requested by the clinical team. IMPRESSION: Successful ultrasound-guided paracentesis yielding 2.2 liters of peritoneal fluid. Read by: Saverio Danker, PA-C Electronically Signed   By: Inez Catalina M.D.   On: 09/17/2016 13:30    Assessment and plan- Patient is a 66 y.o. male referred to [place] for following medical issues:  1. Microcytic anemia- I will get complete anemia work up including cbc, cmp, iron studies, b12, folate, tsh, retic count, copper and myeloma panel. If his hb <7, we will arrange for blood transfusion tomorrow. GI evaluation ongoing. I will also plan to give him 2 doses of feraheme 510 mg IV weekly X2. Risks and benefits of IV iron discussed with the patient and he agrees to proceed  2. Cirrhosis/ ascites being managed by GI  3. Inguinal and iliac adenopathy- I will discuss at tumor board this week and decide about further management. Prior biopsy has been negative for malignancy.   I will arrange f/u based on tumor board discussion   Thank you for this kind referral and the opportunity to participate in the care of this  Patient   Visit Diagnosis 1. Lymphadenopathy, inguinal   2. Iron deficiency anemia, unspecified iron deficiency anemia type     Dr. Randa Evens,  MD, MPH [place] [facility] Pager- [phone] 09/19/2016

## 2016-09-19 NOTE — Progress Notes (Signed)
Please have him see me in 3 weeks time. No labs

## 2016-09-20 ENCOUNTER — Telehealth: Payer: Self-pay | Admitting: *Deleted

## 2016-09-20 LAB — TYPE AND SCREEN
ABO/RH(D): A POS
ANTIBODY SCREEN: NEGATIVE
Unit division: 0

## 2016-09-20 LAB — BODY FLUID CULTURE: CULTURE: NO GROWTH

## 2016-09-20 LAB — BPAM RBC
Blood Product Expiration Date: 201806182359
ISSUE DATE / TIME: 201806131018
Unit Type and Rh: 600

## 2016-09-20 LAB — LIPASE, FLUID: Lipase-Fluid: 12 U/L

## 2016-09-20 LAB — COPPER, SERUM: COPPER: 113 ug/dL (ref 72–166)

## 2016-09-20 NOTE — Telephone Encounter (Signed)
Called Theodoro Doing the family member that brings him to appts and his wife answered the phone  And said that Theodoro Doing is at work right now but she will take the message and have wilbur call me back tom am. Dr. Janese Banks wants pt to come in and get 2 doses of IV iron and then make appt to see md in 2 months with labs: cbc, ferritin, IIBC.

## 2016-09-20 NOTE — Telephone Encounter (Signed)
-----   Message from Sindy Guadeloupe, MD sent at 09/19/2016  6:53 PM EDT ----- Please have him see me in 3 weeks time. No labs

## 2016-09-21 ENCOUNTER — Ambulatory Visit
Admission: RE | Admit: 2016-09-21 | Discharge: 2016-09-21 | Disposition: A | Discharge status: Home / Self Care | Payer: Medicare HMO | Source: Ambulatory Visit | Attending: Surgery | Admitting: Surgery

## 2016-09-21 ENCOUNTER — Encounter: Payer: Medicare HMO | Attending: Surgery | Admitting: Surgery

## 2016-09-21 ENCOUNTER — Other Ambulatory Visit: Payer: Self-pay | Admitting: Surgery

## 2016-09-21 DIAGNOSIS — Z923 Personal history of irradiation: Secondary | ICD-10-CM | POA: Insufficient documentation

## 2016-09-21 DIAGNOSIS — F17218 Nicotine dependence, cigarettes, with other nicotine-induced disorders: Secondary | ICD-10-CM | POA: Diagnosis not present

## 2016-09-21 DIAGNOSIS — Z86718 Personal history of other venous thrombosis and embolism: Secondary | ICD-10-CM | POA: Diagnosis not present

## 2016-09-21 DIAGNOSIS — I110 Hypertensive heart disease with heart failure: Secondary | ICD-10-CM | POA: Diagnosis not present

## 2016-09-21 DIAGNOSIS — D649 Anemia, unspecified: Secondary | ICD-10-CM | POA: Insufficient documentation

## 2016-09-21 DIAGNOSIS — B999 Unspecified infectious disease: Secondary | ICD-10-CM | POA: Insufficient documentation

## 2016-09-21 DIAGNOSIS — Z9119 Patient's noncompliance with other medical treatment and regimen: Secondary | ICD-10-CM | POA: Insufficient documentation

## 2016-09-21 DIAGNOSIS — Z85828 Personal history of other malignant neoplasm of skin: Secondary | ICD-10-CM | POA: Insufficient documentation

## 2016-09-21 DIAGNOSIS — L97522 Non-pressure chronic ulcer of other part of left foot with fat layer exposed: Secondary | ICD-10-CM | POA: Diagnosis not present

## 2016-09-21 DIAGNOSIS — E11621 Type 2 diabetes mellitus with foot ulcer: Secondary | ICD-10-CM | POA: Diagnosis not present

## 2016-09-21 DIAGNOSIS — I87313 Chronic venous hypertension (idiopathic) with ulcer of bilateral lower extremity: Secondary | ICD-10-CM | POA: Diagnosis not present

## 2016-09-21 DIAGNOSIS — I890 Lymphedema, not elsewhere classified: Secondary | ICD-10-CM | POA: Diagnosis not present

## 2016-09-21 DIAGNOSIS — I509 Heart failure, unspecified: Secondary | ICD-10-CM | POA: Diagnosis not present

## 2016-09-21 DIAGNOSIS — Z7984 Long term (current) use of oral hypoglycemic drugs: Secondary | ICD-10-CM | POA: Diagnosis not present

## 2016-09-21 DIAGNOSIS — M7731 Calcaneal spur, right foot: Secondary | ICD-10-CM | POA: Diagnosis not present

## 2016-09-21 DIAGNOSIS — F1019 Alcohol abuse with unspecified alcohol-induced disorder: Secondary | ICD-10-CM | POA: Diagnosis not present

## 2016-09-21 DIAGNOSIS — L97512 Non-pressure chronic ulcer of other part of right foot with fat layer exposed: Secondary | ICD-10-CM | POA: Insufficient documentation

## 2016-09-21 DIAGNOSIS — J449 Chronic obstructive pulmonary disease, unspecified: Secondary | ICD-10-CM | POA: Insufficient documentation

## 2016-09-21 DIAGNOSIS — E1140 Type 2 diabetes mellitus with diabetic neuropathy, unspecified: Secondary | ICD-10-CM | POA: Insufficient documentation

## 2016-09-21 NOTE — Telephone Encounter (Signed)
Theodoro Doing called back today and he can bring pt 6/20 and 6/27.  I called pt and he is agreeable to above to get 2 doses of feraheme 1 week apart.

## 2016-09-23 NOTE — Progress Notes (Signed)
[name], [name] ([id number]) Visit Report for 09/21/2016 Abuse/Suicide Risk Screen Details Patient Name: [name], [name] 09/21/2016 8:00 Date of Service: AM Medical Record [id number] Number: Patient Account Number: [id number] Date of Birth/Sex: 02-18-1951 (66 y.o. Male) Treating RN: Elyn Peers, Other Clinician: Primary Care Bart [name]: Aundria Rud, Errol Referring Abanoub Hanken: CLINE, TODD Calvin Jablonowski/Extender: [place]ella [place] in Treatment: 0 Abuse/Suicide Risk Screen Items Answer ABUSE/SUICIDE RISK SCREEN: Has anyone close to you tried to hurt or harm you recentlyo No Do you feel uncomfortable with anyone in your familyo No Has anyone forced you do things that you didnot want to doo No Do you have any thoughts of harming yourselfo No Patient displays signs or symptoms of abuse and/or neglect. No Electronic Signature(s) Signed: 09/21/2016 4:38:01 PM By: Montey Hora Entered By: Montey Hora on 09/21/2016 08:27:07 Winiarski, [name] ([id number]) -------------------------------------------------------------------------------- Activities of Daily Living Details Patient Name: [name], [name] 09/21/2016 8:00 Date of Service: AM Medical Record [id number] Number: Patient Account Number: [id number] Date of Birth/Sex: Jul 14, 1950 (66 y.o. Male) Treating RN: Elyn Peers, Other Clinician: Primary Care Myrian Botello: Anette Guarneri Referring Saintclair Schroader: CLINE, TODD Eura Mccauslin/Extender: Suella [place] in Treatment: 0 Activities of Daily Living Items Answer Activities of Daily Living (Please select one for each item) Drive Automobile Not Able Take Medications Completely Able Use Telephone Completely Able Care for Appearance Completely Able Use Toilet Completely Able Bath / Shower Completely Able Dress Self Completely Able Feed Self Completely Able Walk Completely Able Get In / Out Bed Completely Able Housework Need Assistance Prepare Meals Completely  [place] for Self Need Assistance Electronic Signature(s) Signed: 09/21/2016 4:38:01 PM By: Montey Hora Entered By: Montey Hora on 09/21/2016 08:27:39 Cogburn, [name] ([id number]) -------------------------------------------------------------------------------- Education Assessment Details Patient Name: [name] Brittle E. 09/21/2016 8:00 Date of Service: AM Medical Record [id number] Number: Patient Account Number: [id number] Date of Birth/Sex: 10/11/1950 (66 y.o. Male) Treating RN: Elyn Peers, Other Clinician: Primary Care Candra Wegner: Anette Guarneri Referring Garlon Tuggle: CLINE, TODD Jackqulyn Mendel/Extender: Suella [place] in Treatment: 0 Primary Learner Assessed: Caregiver Reason Patient is not Primary Learner: wound location Learning Preferences/Education Level/Primary Language Learning Preference: Explanation, Demonstration Highest Education Level: High School Preferred Language: English Cognitive Barrier Assessment/Beliefs Language Barrier: No Translator Needed: No Memory Deficit: No Emotional Barrier: No Cultural/Religious Beliefs Affecting Medical No Care: Physical Barrier Assessment Impaired Vision: No Impaired Hearing: No Decreased Hand dexterity: No Knowledge/Comprehension Assessment Knowledge Level: Medium Comprehension Level: Medium Ability to understand written Medium instructions: Ability to understand verbal Medium instructions: Motivation Assessment Anxiety Level: Calm Cooperation: Cooperative Education Importance: Acknowledges Need Interest in Health Problems: Asks Questions Perception: Coherent Willingness to Engage in Self- Medium Management Activities: [employer] ([id number]) Readiness to Engage in Self- Management Activities: Electronic Signature(s) Signed: 09/21/2016 4:38:01 PM By: Montey Hora Entered By: Montey Hora on 09/21/2016 08:28:10 Trimble, [name]  ([id number]) -------------------------------------------------------------------------------- Fall Risk Assessment Details Patient Name: [name] Brittle E. 09/21/2016 8:00 Date of Service: AM Medical Record [id number] Number: Patient Account Number: [id number] Date of Birth/Sex: Aug 08, 1950 (66 y.o. Male) Treating RN: Elyn Peers, Other Clinician: Primary Care Kairi Tufo: Anette Guarneri Referring Andrika Peraza: CLINE, TODD Brandee Markin/Extender: Suella [place] in Treatment: 0 Fall Risk Assessment Items Have you had 2 or more falls in the last 12 monthso 0 No Have you had any fall that resulted in injury in the last 12 monthso 0 No FALL RISK ASSESSMENT: History of falling - immediate or within 3 months 0 No Secondary diagnosis 0 No Ambulatory aid None/bed  rest/wheelchair/nurse 0 No Crutches/cane/walker 0 No Furniture 0 No IV Access/Saline Lock 0 No Gait/Training Normal/bed rest/immobile 0 No Weak 10 Yes Impaired 0 No Mental Status Oriented to own ability 0 Yes Electronic Signature(s) Signed: 09/21/2016 4:38:01 PM By: Montey Hora Entered By: Montey Hora on 09/21/2016 08:28:20 Amadi, [name] ([id number]) -------------------------------------------------------------------------------- Foot Assessment Details Patient Name: [name] Brittle E. 09/21/2016 8:00 Date of Service: AM Medical Record [id number] Number: Patient Account Number: [id number] Date of Birth/Sex: 07-Jan-1951 (66 y.o. Male) Treating RN: Elyn Peers, Other Clinician: Primary Care Dashanae Longfield: Anette Guarneri Referring Kalanie Fewell: CLINE, TODD Rosali Augello/Extender: Weeks in Treatment: 0 Foot Assessment Items Site Locations + = Sensation present, - = Sensation absent, C = Callus, U = Ulcer R = Redness, W = Warmth, M = Maceration, PU = Pre-ulcerative lesion F = Fissure, S = Swelling, D = Dryness Assessment Right: Left: Other Deformity: No No Prior Foot Ulcer: No  No Prior Amputation: No No Charcot Joint: No No Ambulatory Status: Ambulatory Without Help Gait: Steady Electronic Signature(s) Signed: 09/21/2016 4:38:01 PM By: Montey Hora Entered By: Montey Hora on 09/21/2016 09:00:55 Golladay, [name] ([id number]) -------------------------------------------------------------------------------- Nutrition Risk Assessment Details Patient Name: [name] Brittle E. 09/21/2016 8:00 Date of Service: AM Medical Record [id number] Number: Patient Account Number: [id number] Date of Birth/Sex: Dec 19, 1950 (66 y.o. Male) Treating RN: Elyn Peers, Other Clinician: Primary Care Trinette Vera: Aundria Rud, Errol Referring Imelda Dandridge: CLINE, TODD Kaloni Bisaillon/Extender: Weeks in Treatment: 0 Height (in): 69 Weight (lbs): 168 Body Mass Index (BMI): 24.8 Nutrition Risk Assessment Items NUTRITION RISK SCREEN: I have an illness or condition that made me change the kind and/or 0 No amount of food I eat I eat fewer than two meals per day 0 No I eat few fruits and vegetables, or milk products 0 No I have three or more drinks of beer, liquor or wine almost every day 0 No I have tooth or mouth problems that make it hard for me to eat 0 No I don't always have enough money to buy the food I need 0 No I eat alone most of the time 0 No I take three or more different prescribed or over-the-counter drugs a 1 Yes day Without wanting to, I have lost or gained 10 pounds in the last six 0 No months I am not always physically able to shop, cook and/or feed myself 0 No Nutrition Protocols Good Risk Protocol 0 No interventions needed Moderate Risk Protocol Electronic Signature(s) Signed: 09/21/2016 4:38:01 PM By: Montey Hora Entered By: Montey Hora on 09/21/2016 99:83:38

## 2016-09-23 NOTE — Progress Notes (Addendum)
[name], [name] ([id number]) Visit Report for 09/21/2016 Chief Complaint Document Details Patient Name: [name], [name] 09/21/2016 8:00 Date of Service: AM Medical Record [id number] Number: Patient Account Number: [id number] Date of Birth/Sex: [birth year] (66 y.o. Male) Treating RN: [name], Other Clinician: Primary Care Provider: Olivia [name] Treating [name], [name], Provider/Extender: Referring Provider: Harless [name] in Treatment: 0 Information Obtained from: Patient Chief Complaint Patients presents for treatment of an open diabetic ulcer to both feet for about 4 months Electronic Signature(s) Signed: 09/21/2016 9:26:52 AM By: [name] Fudge MD, FACS Entered By: [name] on 09/21/2016 09:26:52 [name], [name] ([id number]) -------------------------------------------------------------------------------- HPI Details Patient Name: [name] 09/21/2016 8:00 Date of Service: AM Medical Record [id number] Number: Patient Account Number: [id number] Date of Birth/Sex: July 05, 1950 (66 y.o. Male) Treating RN: [name], Other Clinician: Primary Care Provider: Olivia [name] Treating Aleni Andrus, [name], Provider/Extender: Referring Provider: Harless [name] in Treatment: 0 History of Present Illness Location: bilateral feet ulceration on the toes Quality: Patient reports experiencing a dull pain to affected area(s). Severity: Patient states wound are getting worse. Duration: Patient has had the wound for > 3 months prior to seeking treatment at the wound center Timing: Pain in wound is constant (hurts [name] the time) Context: The wound would happen gradually Modifying Factors: Other treatment(s) tried include:treatment for lymphedema and is seen by the podiatrist Dr. Caryl [name] Associated Signs and Symptoms: Patient reports having increase swelling. HPI Description: 66 year old patient here to see as for bilateral feet ulceration to  on his left first and second toe and 2 on his right first and second toe, which she's had for about 4 months. 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 The patient is also undergoing workup by medical oncology for a intra-abdominal lymphadenopathy. 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 He is also status post appendectomy, inguinal lymph node biopsy, rectal biopsy and rectal examination under anesthesia. he currently smokes cigarettes about half packet a day. In March of this year he was seen by Dr. Hortencia [name], for evaluation of bilateral varicose veins and besides wearing compression stockings he had recommended laser ablation of the right and left great saphenous veins to eleviate the symptoms and complications of severe superficial venous reflux disease. He also recommended lymphedema pumps for better control of his lymphedema. The patient recently has had on 08/23/2016, right greater saphenous vein ablation with the laser energy Earlier lower extremity venous reflux examination done on 05/08/2016 showed no DVT or SVT both lower legs but incompetence of bilateral great saphenous veins was present. A lower arterial study was also done and there was no significant right lower and left lower extremity problems based on a normal toe brachial index bilaterally and the ABI was 1.221 the left and 1.23 on the right. His post ablation venous duplex examination showed successful ablation of the right GS vein with thrombus formation 2 below the right saphenofemoral junction. The deep system was patent without evidence of thrombosis and this was done on 08/30/2016. the patient also has a squamous cell cancer of the skin of the buttock and  is recently undergone radiation therapy for this prior to excisional surgery. Addendum: regarding his x-rays done today and x-ray of the left foot -- IMPRESSION: No objective [name], [name] (  [id number]) evidence of osteomyelitis. There are soft tissue changes which may reflect cellulitis. X-ray of the right foot -- IMPRESSION:Findings compatible with cellulitis of the toes. No objective evidence of osteomyelitis is observed. Electronic Signature(s) Signed: 10/08/2016 11:03:45 AM By: [name] Fudge MD, FACS Previous Signature: 09/21/2016 2:33:39 PM Version By: [name] Fudge MD, FACS Previous Signature: 09/21/2016 9:26:59 AM Version By: [name] Fudge MD, FACS Previous Signature: 09/21/2016 9:24:14 AM Version By: [name] Fudge MD, FACS Previous Signature: 09/21/2016 8:54:20 AM Version By: [name] Fudge MD, FACS Previous Signature: 09/21/2016 8:52:31 AM Version By: [name] Fudge MD, FACS Entered By: [name] on 10/08/2016 11:03:45 [name], [name] ([id number]) -------------------------------------------------------------------------------- Physical Exam Details Patient Name: [name] 09/21/2016 8:00 Date of Service: AM Medical Record [id number] Number: Patient Account Number: [id number] Date of Birth/Sex: 06-26-1950 (66 y.o. Male) Treating RN: [name], Other Clinician: Primary Care Provider: TRACY Treating [name], [name], Provider/Extender: Referring Provider: Harless [name] in Treatment: 0 Constitutional . Pulse regular. Respirations normal and unlabored. Afebrile. . Eyes Nonicteric. Reactive to light. Ears, Nose, Mouth, and Throat Lips, teeth, and gums WNL.Marland Kitchen Moist mucosa without lesions. Neck supple and nontender. No palpable supraclavicular or cervical adenopathy. Normal sized without goiter. Respiratory WNL. No retractions.. Cardiovascular Pedal Pulses WNL. ABI done recently at the vascular lab noted. he has bilateral stage II  lymphedema. Chest Breasts symmetical and no nipple discharge.. Breast tissue WNL, no masses, lumps, or tenderness.. Gastrointestinal (GI) Abdomen without masses or tenderness.. No liver or spleen enlargement or tenderness.. Lymphatic No adneopathy. No adenopathy. No adenopathy. Musculoskeletal Adexa without tenderness or enlargement.. Digits and nails w/o clubbing, cyanosis, infection, petechiae, ischemia, or inflammatory conditions.. Integumentary (Hair, Skin) No suspicious lesions. No crepitus or fluctuance. No peri-wound warmth or erythema. No masses.Marland Kitchen Psychiatric Judgement and insight Intact.. No evidence of depression, anxiety, or agitation.. Notes the patient has bilateral stage II lymphedema and has got ulceration both feet around the toes first and second and the interdigital space. The wound is clean but has a lot of order and probably needs better hygiene. Does not probe down to bone. Electronic Signature(s) Signed: 09/21/2016 9:30:26 AM By: [name] Fudge MD, FACS [name], [name] ([id number]) Entered By: [name] on 09/21/2016 09:30:25 [name], [name] ([id number]) -------------------------------------------------------------------------------- Physician Orders Details Patient Name: [name], [name] 09/21/2016 8:00 Date of Service: AM Medical Record [id number] Number: Patient Account Number: [id number] Date of Birth/Sex: 03-Dec-1950 (66 y.o. Male) Treating RN: [name], Other Clinician: Primary Care Provider: Olivia [name] Treating Keliyah Spillman, [name], Provider/Extender: Referring Provider: Harless [name] in Treatment: 0 Verbal / Phone Orders: No Diagnosis Coding Wound Cleansing Wound #1 Right Toe Great o Cleanse wound with mild soap and water o May Shower, gently pat wound dry prior to applying new dressing. Wound #2 Right Toe Second o Cleanse wound with mild soap and water o May Shower, gently pat wound dry prior to applying new  dressing. Wound #3 Right Toe Third o Cleanse wound with mild soap and water o May Shower, gently pat wound dry prior to applying new dressing. Wound #4 Right,Dorsal Foot o Cleanse wound with mild soap and water o May Shower, gently pat wound dry prior to applying new dressing. Wound #5 Left Toe Great o Cleanse wound with mild soap and water o May Shower, gently pat wound dry prior to applying new dressing. Wound #6 Left Toe Second o Cleanse wound with mild soap and water o May Shower, gently pat wound dry prior to applying new dressing. Anesthetic Wound #1 Right Toe  Great o Topical Lidocaine 4% cream applied to wound bed prior to debridement Wound #2 Right Toe Second o Topical Lidocaine 4% cream applied to wound bed prior to debridement Wound #3 Right Toe Third o Topical Lidocaine 4% cream applied to wound bed prior to debridement [name], [name] ([id number]) Wound #4 Right,Dorsal Foot o Topical Lidocaine 4% cream applied to wound bed prior to debridement Wound #5 Left Toe Great o Topical Lidocaine 4% cream applied to wound bed prior to debridement Wound #6 Left Toe Second o Topical Lidocaine 4% cream applied to wound bed prior to debridement Primary Wound Dressing Wound #1 Right Toe Great o Aquacel Ag Wound #2 Right Toe Second o Aquacel Ag Wound #3 Right Toe Third o Aquacel Ag Wound #4 Right,Dorsal Foot o Aquacel Ag Wound #5 Left Toe Great o Aquacel Ag Wound #6 Left Toe Second o Aquacel Ag Secondary Dressing Wound #1 Right Toe Great o Gauze and Kerlix/Conform Wound #2 Right Toe Second o Gauze and Kerlix/Conform Wound #3 Right Toe Third o Gauze and Kerlix/Conform Wound #4 Right,Dorsal Foot o Gauze and Kerlix/Conform Wound #5 Left Toe Great o Gauze and Kerlix/Conform Wound #6 Left Toe Second o Gauze and Kerlix/Conform [name], [name] ([id number]) Dressing Change Frequency Wound #1 Right Toe Great o Change  dressing every day. Wound #2 Right Toe Second o Change dressing every day. Wound #3 Right Toe Third o Change dressing every day. Wound #4 Right,Dorsal Foot o Change dressing every day. Wound #5 Left Toe Great o Change dressing every day. Wound #6 Left Toe Second o Change dressing every day. Follow-up Appointments Wound #1 Right Toe Great o Return Appointment in 1 week. Wound #2 Right Toe Second o Return Appointment in 1 week. Wound #3 Right Toe Third o Return Appointment in 1 week. Wound #4 Right,Dorsal Foot o Return Appointment in 1 week. Wound #5 Left Toe Great o Return Appointment in 1 week. Wound #6 Left Toe Second o Return Appointment in 1 week. Edema Control Wound #1 Right Toe Great o Patient to wear own compression stockings o Elevate legs to the level of the heart and pump ankles as often as possible Wound #2 Right Toe Second o Patient to wear own compression stockings o Elevate legs to the level of the heart and pump ankles as often as possible Wound #3 Right Toe Third o Patient to wear own compression stockings [name], [name] ([id number]) o Elevate legs to the level of the heart and pump ankles as often as possible Wound #4 Right,Dorsal Foot o Patient to wear own compression stockings o Elevate legs to the level of the heart and pump ankles as often as possible Wound #5 Left Toe Great o Patient to wear own compression stockings o Elevate legs to the level of the heart and pump ankles as often as possible Wound #6 Left Toe Second o Patient to wear own compression stockings o Elevate legs to the level of the heart and pump ankles as often as possible Additional Orders / Instructions Wound #1 Right Toe Great o Stop Smoking - Please continue to stay away from alcohol o Other: - Please add vitamin A, vitamin C, and Zinc supplements to your diet Wound #2 Right Toe Second o Stop Smoking - Please continue to stay  away from alcohol o Other: - Please add vitamin A, vitamin C, and Zinc supplements to your diet Wound #3 Right Toe Third o Stop Smoking - Please continue to stay away from alcohol o Other: - Please  add vitamin A, vitamin C, and Zinc supplements to your diet Wound #4 Right,Dorsal Foot o Stop Smoking - Please continue to stay away from alcohol o Other: - Please add vitamin A, vitamin C, and Zinc supplements to your diet Wound #5 Left Toe Great o Stop Smoking - Please continue to stay away from alcohol o Other: - Please add vitamin A, vitamin C, and Zinc supplements to your diet Wound #6 Left Toe Second o Stop Smoking - Please continue to stay away from alcohol o Other: - Please add vitamin A, vitamin C, and Zinc supplements to your diet Home Health Wound #1 Right Toe [place] for [place] Nurse may visit PRN to address patientos wound care needs. o FACE TO FACE ENCOUNTER: MEDICARE and MEDICAID PATIENTS: I certify that this patient is under my care and that I had a face-to-face encounter that meets the physician face-to-face encounter requirements with this patient on this date. The encounter with the patient was in whole or in part for the following MEDICAL CONDITION: (primary reason for [place]) MEDICAL NECESSITY: I certify, that based on my findings, NURSING services are a medically [name], [name] CZAJA. ([id number]) necessary home health service. HOME BOUND STATUS: I certify that my clinical findings support that this patient is homebound (i.e., Due to illness or injury, pt requires aid of supportive devices such as crutches, cane, wheelchairs, walkers, the use of special transportation or the assistance of another person to leave their place of residence. There is a normal inability to leave the home and doing so requires considerable and taxing effort. Other absences are for medical reasons / religious services and are  infrequent or of short duration when for other reasons). o If current dressing causes regression in wound condition, may D/C ordered dressing product/s and apply Normal Saline Moist Dressing daily until next [place] / Other MD appointment. [place] of regression in wound condition at [phone]. o Please direct any NON-WOUND related issues/requests for orders to patient's Primary Care Physician Wound #2 Right [place] for [place] Nurse may visit PRN to address patientos wound care needs. o FACE TO FACE ENCOUNTER: MEDICARE and MEDICAID PATIENTS: I certify that this patient is under my care and that I had a face-to-face encounter that meets the physician face-to-face encounter requirements with this patient on this date. The encounter with the patient was in whole or in part for the following MEDICAL CONDITION: (primary reason for [place]) MEDICAL NECESSITY: I certify, that based on my findings, NURSING services are a medically necessary home health service. HOME BOUND STATUS: I certify that my clinical findings support that this patient is homebound (i.e., Due to illness or injury, pt requires aid of supportive devices such as crutches, cane, wheelchairs, walkers, the use of special transportation or the assistance of another person to leave their place of residence. There is a normal inability to leave the home and doing so requires considerable and taxing effort. Other absences are for medical reasons / religious services and are infrequent or of short duration when for other reasons). o If current dressing causes regression in wound condition, may D/C ordered dressing product/s and apply Normal Saline Moist Dressing daily until next [place] / Other MD appointment. [place] of regression in wound condition at [phone]. o Please direct any NON-WOUND  related issues/requests for orders to patient's Primary Care Physician Wound #  3 Right Toe [place] for [place] Nurse may visit PRN to address patientos wound care needs. o FACE TO FACE ENCOUNTER: MEDICARE and MEDICAID PATIENTS: I certify that this patient is under my care and that I had a face-to-face encounter that meets the physician face-to-face encounter requirements with this patient on this date. The encounter with the patient was in whole or in part for the following MEDICAL CONDITION: (primary reason for [place]) MEDICAL NECESSITY: I certify, that based on my findings, NURSING services are a medically necessary home health service. HOME BOUND STATUS: I certify that my clinical findings support that this patient is homebound (i.e., Due to illness or injury, pt requires aid of supportive devices such as crutches, cane, wheelchairs, walkers, the use of special transportation or the assistance of another person to leave their place of residence. There is a normal inability to leave the home and doing so requires considerable and taxing effort. Other absences are for medical reasons / religious services and are infrequent or of short duration when for other reasons). [name], ALLAH REASON ([id number]) o If current dressing causes regression in wound condition, may D/C ordered dressing product/s and apply Normal Saline Moist Dressing daily until next [place] / Other MD appointment. [place] of regression in wound condition at [phone]. o Please direct any NON-WOUND related issues/requests for orders to patient's Primary Care Physician Wound #4 [place] for [place] Nurse may visit PRN to address patientos wound care needs. o FACE TO FACE ENCOUNTER: MEDICARE and MEDICAID PATIENTS: I certify that this patient is under my care and that I had a  face-to-face encounter that meets the physician face-to-face encounter requirements with this patient on this date. The encounter with the patient was in whole or in part for the following MEDICAL CONDITION: (primary reason for [place]) MEDICAL NECESSITY: I certify, that based on my findings, NURSING services are a medically necessary home health service. HOME BOUND STATUS: I certify that my clinical findings support that this patient is homebound (i.e., Due to illness or injury, pt requires aid of supportive devices such as crutches, cane, wheelchairs, walkers, the use of special transportation or the assistance of another person to leave their place of residence. There is a normal inability to leave the home and doing so requires considerable and taxing effort. Other absences are for medical reasons / religious services and are infrequent or of short duration when for other reasons). o If current dressing causes regression in wound condition, may D/C ordered dressing product/s and apply Normal Saline Moist Dressing daily until next [place] / Other MD appointment. [place] of regression in wound condition at [phone]. o Please direct any NON-WOUND related issues/requests for orders to patient's Primary Care Physician Wound #5 Left Toe [place] for [place] Nurse may visit PRN to address patientos wound care needs. o FACE TO FACE ENCOUNTER: MEDICARE and MEDICAID PATIENTS: I certify that this patient is under my care and that I had a face-to-face encounter that meets the physician face-to-face encounter requirements with this patient on this date. The encounter with the patient was in whole or in part for the following MEDICAL CONDITION: (primary reason for [place]) MEDICAL NECESSITY: I certify, that based on my findings, NURSING services are a medically necessary home health service. HOME  BOUND STATUS: I certify that  my clinical findings support that this patient is homebound (i.e., Due to illness or injury, pt requires aid of supportive devices such as crutches, cane, wheelchairs, walkers, the use of special transportation or the assistance of another person to leave their place of residence. There is a normal inability to leave the home and doing so requires considerable and taxing effort. Other absences are for medical reasons / religious services and are infrequent or of short duration when for other reasons). o If current dressing causes regression in wound condition, may D/C ordered dressing product/s and apply Normal Saline Moist Dressing daily until next [place] / Other MD appointment. [place] of regression in wound condition at [phone]. o Please direct any NON-WOUND related issues/requests for orders to patient's Primary Care Physician Wound #6 Left [place] for [place] ([id number]) o [place] Nurse may visit PRN to address patientos wound care needs. o FACE TO FACE ENCOUNTER: MEDICARE and MEDICAID PATIENTS: I certify that this patient is under my care and that I had a face-to-face encounter that meets the physician face-to-face encounter requirements with this patient on this date. The encounter with the patient was in whole or in part for the following MEDICAL CONDITION: (primary reason for [place]) MEDICAL NECESSITY: I certify, that based on my findings, NURSING services are a medically necessary home health service. HOME BOUND STATUS: I certify that my clinical findings support that this patient is homebound (i.e., Due to illness or injury, pt requires aid of supportive devices such as crutches, cane, wheelchairs, walkers, the use of special transportation or the assistance of another person to leave their place of residence. There is a normal inability to  leave the home and doing so requires considerable and taxing effort. Other absences are for medical reasons / religious services and are infrequent or of short duration when for other reasons). o If current dressing causes regression in wound condition, may D/C ordered dressing product/s and apply Normal Saline Moist Dressing daily until next [place] / Other MD appointment. [place] of regression in wound condition at [phone]. o Please direct any NON-WOUND related issues/requests for orders to patient's Primary Care Physician Radiology o X-ray, foot - bilateral feet Electronic Signature(s) Signed: 09/21/2016 4:13:16 PM By: [name] Fudge MD, FACS Signed: 09/21/2016 4:38:01 PM By: Montey Hora Entered By: Montey Hora on 09/21/2016 09:20:14 Head, [name] ([id number]) -------------------------------------------------------------------------------- Problem List Details Patient Name: MIKLE, STERNBERG 09/21/2016 8:00 Date of Service: AM Medical Record [id number] Number: Patient Account Number: [id number] Date of Birth/Sex: 11/04/1950 (66 y.o. Male) Treating RN: [name], Other Clinician: Primary Care Provider: Olivia [name] Treating Aanya Haynes, [name], Provider/Extender: Referring Provider: Harless [name] in Treatment: 0 Active Problems ICD-10 Encounter Code Description Active Date Diagnosis E11.621 Type 2 diabetes mellitus with foot ulcer 09/21/2016 Yes I87.313 Chronic venous hypertension (idiopathic) with ulcer of 09/21/2016 Yes bilateral lower extremity I89.0 Lymphedema, not elsewhere classified 09/21/2016 Yes L97.522 Non-pressure chronic ulcer of other part of left foot with fat 09/21/2016 Yes layer exposed L97.512 Non-pressure chronic ulcer of other part of right foot with 09/21/2016 Yes fat layer exposed F17.218 Nicotine dependence, cigarettes, 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 ([id number]) -------------------------------------------------------------------------------- Progress Note Details  Patient Name: BERTEL, [name] 09/21/2016 8:00 Date of Service: AM Medical Record [id number] Number: Patient Account Number: [id number] Date of Birth/Sex: 1950-05-05 (66 y.o. Male) Treating RN: [name], Other Clinician: Primary Care Provider: Olivia [name] Treating Jayse Hodkinson, [name], Provider/Extender: Referring Provider: Harless [name] in Treatment: 0 Subjective Chief Complaint Information obtained from Patient Patients presents for treatment of an open diabetic ulcer to both feet for about 4 months History of Present Illness (HPI) The following HPI elements were documented for the patient's wound: Location: bilateral feet ulceration on the toes Quality: Patient reports experiencing a dull pain to affected area(s). Severity: Patient states wound are getting worse. Duration: Patient has had the wound for > 3 months prior to seeking treatment at the wound center Timing: Pain in wound is constant (hurts [name] the time) Context: The wound would happen gradually Modifying Factors: Other treatment(s) tried include:treatment for lymphedema and is seen by the podiatrist Dr. Caryl [name] Associated Signs and Symptoms: Patient reports having increase swelling. 66 year old patient here to see as for bilateral feet ulceration to on his left first and second toe and 2 on his right first and second toe, which she's had for about 4 months. 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 The patient is also undergoing workup by medical oncology for a intra- abdominal lymphadenopathy. 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 He is also status post appendectomy, inguinal lymph node biopsy, rectal biopsy and rectal examination under anesthesia. he currently smokes cigarettes about half packet a day. In March of this year he was seen by Dr. Hortencia [name], for evaluation of bilateral varicose veins and besides wearing compression stockings he had recommended laser ablation of the right and left great saphenous veins to eleviate the symptoms and complications of severe superficial venous reflux disease. He also recommended lymphedema pumps for better control of his lymphedema. The patient recently has had on 08/23/2016, right greater saphenous vein ablation with the laser energy Earlier lower extremity venous reflux examination done on 05/08/2016 showed no DVT or SVT both lower legs but incompetence of bilateral great saphenous veins was present. A lower arterial study was also done and there was no significant right lower and left lower extremity problems based on a normal toe brachial index bilaterally and the ABI was 1.221 the left and 1.23 on the Lomas, Tyjae E. ([id number]) right. His post ablation venous duplex examination showed successful ablation of the right GS vein with thrombus formation 2 below the right saphenofemoral junction. The deep system was patent without evidence of thrombosis and this was done on 08/30/2016. 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 Addendum: regarding his x-rays done today and x-ray of the left foot -- IMPRESSION: No objective evidence of osteomyelitis. There are soft tissue changes which may  reflect cellulitis. X-ray of the right foot -- IMPRESSION:Findings compatible with cellulitis of the toes. No objective evidence of osteomyelitis is observed. Wound History Patient presents with 6 open wounds that have been present for approximately 3 months. Patient has been treating wounds in the following manner: ointment and bandage. Laboratory tests have not been performed in the last month. Patient reportedly has not  tested positive for an antibiotic resistant organism. Patient reportedly has not tested positive for osteomyelitis. Patient reportedly has had testing performed to evaluate circulation in the legs. Patient History Information obtained from Patient. Allergies No Known Drug Allergies Social History Current every day smoker, Marital Status - Widowed, Alcohol Use - Daily - quit drinking about a week ago, Drug Use - No History, Caffeine Use - Moderate. Medical History Eyes Denies history of Cataracts, Glaucoma, Optic Neuritis Ear/Nose/Mouth/Throat Denies history of Chronic sinus problems/congestion, Middle ear problems Hematologic/Lymphatic Patient has history of Anemia, Lymphedema Denies history of Hemophilia, Human Immunodeficiency Virus, Sickle Cell Disease Respiratory Denies history of Aspiration, Asthma, Chronic Obstructive Pulmonary Disease (COPD), Pneumothorax, Sleep Apnea, Tuberculosis Cardiovascular Patient has history of Congestive Heart Failure, Hypertension, Peripheral Venous Disease Denies history of Angina, Arrhythmia, Coronary Artery Disease, Deep Vein Thrombosis, Hypotension, Myocardial Infarction, Peripheral Arterial Disease, Phlebitis, Vasculitis Gastrointestinal Denies history of Cirrhosis , Colitis, Crohn s, Hepatitis A, Hepatitis B, Hepatitis C Endocrine Patient has history of Type II Diabetes Genitourinary Bivins, Ikechukwu E. ([id number]) Denies history of End Stage Renal Disease Immunological Denies history of Lupus Erythematosus, Raynaud s,  Scleroderma Integumentary (Skin) Denies history of History of Burn, History of pressure wounds Musculoskeletal Denies history of Gout, Rheumatoid Arthritis, Osteoarthritis, Osteomyelitis Neurologic Patient has history of Neuropathy Denies history of Dementia, Quadriplegia, Paraplegia, Seizure Disorder Patient is treated with Oral Agents. Blood sugar is tested. Medical And Surgical History Notes Oncologic 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 Review of Systems (ROS) Constitutional Symptoms (General Health) The patient has no complaints or symptoms. Eyes The patient has no complaints or symptoms. Ear/Nose/Mouth/Throat The patient has no complaints or symptoms. Hematologic/Lymphatic The patient has no complaints or symptoms. Respiratory The patient has no complaints or symptoms. Cardiovascular Complains or has symptoms of LE edema. Denies complaints or symptoms of Chest pain. Gastrointestinal The patient has no complaints or symptoms. Endocrine The patient has no complaints or symptoms. Genitourinary The patient has no complaints or symptoms. Immunological The patient has no complaints or symptoms. Integumentary (Skin) The patient has no complaints or symptoms. Musculoskeletal The patient has no complaints or symptoms. Neurologic The patient has no complaints or symptoms. Psychiatric The patient has no complaints or symptoms. Harner, Jayshun E. ([id number]) Objective Constitutional Pulse regular. Respirations normal and unlabored. Afebrile. Vitals Time Taken: 8:24 AM, Height: 69 in, Source: Measured, Weight: 168 lbs, Source: Measured, BMI: 24.8, Temperature: 98.2 F, Pulse: 72 bpm, Respiratory Rate: 18 breaths/min, Blood Pressure: 135/79 mmHg. Eyes Nonicteric. Reactive to light. Ears, Nose, Mouth, and Throat Lips, teeth, and gums WNL.Marland Kitchen Moist mucosa without lesions. Neck supple and nontender. No palpable supraclavicular or cervical adenopathy.  Normal sized without goiter. Respiratory WNL. No retractions.. Cardiovascular Pedal Pulses WNL. ABI done recently at the vascular lab noted. he has bilateral stage II lymphedema. Chest Breasts symmetical and no nipple discharge.. Breast tissue WNL, no masses, lumps, or tenderness.. Gastrointestinal (GI) Abdomen without masses or tenderness.. No liver or spleen enlargement or tenderness.. Lymphatic No adneopathy. No adenopathy. No adenopathy. Musculoskeletal Adexa without tenderness or enlargement.. Digits and nails w/o clubbing, cyanosis, infection, petechiae, ischemia, or inflammatory conditions.Marland Kitchen Psychiatric Judgement and insight Intact.. No evidence of depression, anxiety, or agitation.. General Notes: the patient has bilateral stage II lymphedema and has got ulceration both feet around the toes first and second and the interdigital space. The wound is clean but has a lot of order and probably needs better hygiene. Does not probe down to bone. Integumentary (Hair, Skin) Wilcoxson,  Burle E. ([id number]) No suspicious lesions. No crepitus or fluctuance. No peri-wound warmth or erythema. No masses.. Wound #1 status is Open. Original cause of wound was Gradually Appeared. The wound is located on the Right Toe Great. The wound measures 8cm length x 7.5cm width x 0.1cm depth; 47.124cm^2 area and 4.712cm^3 volume. There is no tunneling or undermining noted. There is a large amount of serous drainage noted. The wound margin is flat and intact. There is medium (34-66%) pink granulation within the wound bed. There is a medium (34-66%) amount of necrotic tissue within the wound bed including Adherent Slough. The periwound skin appearance exhibited: Excoriation, Maceration. The periwound skin appearance did not exhibit: Callus, Crepitus, Induration, Rash, Scarring, Dry/Scaly, Atrophie Blanche, Cyanosis, Ecchymosis, Hemosiderin Staining, Mottled, Pallor, Rubor, Erythema. Periwound temperature was  noted as No Abnormality. Wound #2 status is Open. Original cause of wound was Gradually Appeared. The wound is located on the Right Toe Second. The wound measures 2.3cm length x 1.7cm width x 0.1cm depth; 3.071cm^2 area and 0.307cm^3 volume. There is no tunneling or undermining noted. There is a large amount of serous drainage noted. The wound margin is flat and intact. There is medium (34-66%) pink granulation within the wound bed. There is a medium (34-66%) amount of necrotic tissue within the wound bed including Adherent Slough. The periwound skin appearance exhibited: Excoriation, Maceration. The periwound skin appearance did not exhibit: Callus, Crepitus, Induration, Rash, Scarring, Dry/Scaly, Atrophie Blanche, Cyanosis, Ecchymosis, Hemosiderin Staining, Mottled, Pallor, Rubor, Erythema. Periwound temperature was noted as No Abnormality. Wound #3 status is Open. Original cause of wound was Gradually Appeared. The wound is located on the Right Toe Third. The wound measures 0.8cm length x 0.8cm width x 0.1cm depth; 0.503cm^2 area and 0.05cm^3 volume. There is no tunneling or undermining noted. There is a large amount of serous drainage noted. The wound margin is flat and intact. There is medium (34-66%) pink granulation within the wound bed. There is a medium (34-66%) amount of necrotic tissue within the wound bed including Adherent Slough. The periwound skin appearance exhibited: Excoriation, Maceration. The periwound skin appearance did not exhibit: Callus, Crepitus, Induration, Rash, Scarring, Dry/Scaly, Atrophie Blanche, Cyanosis, Ecchymosis, Hemosiderin Staining, Mottled, Pallor, Rubor, Erythema. Periwound temperature was noted as No Abnormality. Wound #4 status is Open. Original cause of wound was Gradually Appeared. The wound is located on the Right,Dorsal Foot. The wound measures 2.1cm length x 2.7cm width x 0.1cm depth; 4.453cm^2 area and 0.445cm^3 volume. There is no tunneling or  undermining noted. There is a large amount of serous drainage noted. The wound margin is flat and intact. There is medium (34-66%) pink granulation within the wound bed. There is a medium (34-66%) amount of necrotic tissue within the wound bed including Adherent Slough. The periwound skin appearance exhibited: Excoriation, Maceration. The periwound skin appearance did not exhibit: Callus, Crepitus, Induration, Rash, Scarring, Dry/Scaly, Atrophie Blanche, Cyanosis, Ecchymosis, Hemosiderin Staining, Mottled, Pallor, Rubor, Erythema. Periwound temperature was noted as No Abnormality. Wound #5 status is Open. Original cause of wound was Gradually Appeared. The wound is located on the Left Toe Great. The wound measures 3.5cm length x 5.2cm width x 0.1cm depth; 14.294cm^2 area and 1.429cm^3 volume. There is no tunneling or undermining noted. There is a large amount of serous drainage noted. The wound margin is flat and intact. There is medium (34-66%) pink granulation within the wound bed. There is a medium (34-66%) amount of necrotic tissue within the wound bed including Adherent Slough. The periwound skin appearance  exhibited: Excoriation, Maceration. The periwound skin appearance did not exhibit: Callus, Crepitus, Induration, Rash, Scarring, Dry/Scaly, Atrophie Blanche, Cyanosis, Ecchymosis, Hemosiderin Staining, Mottled, Pallor, Rubor, Erythema. Periwound temperature was noted as No Abnormality. ISSACC, MERLO ([id number]) Wound #6 status is Open. Original cause of wound was Gradually Appeared. The wound is located on the Left Toe Second. The wound measures 1.6cm length x 1.7cm width x 0.1cm depth; 2.136cm^2 area and 0.214cm^3 volume. There is no tunneling or undermining noted. There is a large amount of serous drainage noted. The wound margin is flat and intact. There is medium (34-66%) pink granulation within the wound bed. There is a medium (34-66%) amount of necrotic tissue within the wound  bed including Adherent Slough. The periwound skin appearance exhibited: Excoriation, Maceration. The periwound skin appearance did not exhibit: Callus, Crepitus, Induration, Rash, Scarring, Dry/Scaly, Atrophie Blanche, Cyanosis, Ecchymosis, Hemosiderin Staining, Mottled, Pallor, Rubor, Erythema. Periwound temperature was noted as No Abnormality. Assessment Active Problems ICD-10 E11.621 - Type 2 diabetes mellitus with foot ulcer I87.313 - Chronic venous hypertension (idiopathic) with ulcer of bilateral lower extremity I89.0 - Lymphedema, not elsewhere classified L97.522 - Non-pressure chronic ulcer of other part of left foot with fat layer exposed L97.512 - Non-pressure chronic ulcer of other part of right foot with fat layer exposed F17.218 - Nicotine dependence, cigarettes, with other nicotine-induced disorders F10.19 - Alcohol abuse with unspecified alcohol-induced disorder This 66 year old diabetic is rather noncompliant and does not look after his health very well. 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 He is also a heavy smoker and an alcoholic. After review today I have recommended: 1. Silver alginate to be applied over his wounds, and change daily after washing with soap and water 2. x-rays of both feet 3. Good control of his diabetes mellitus 4. I have spent over 3 minutes discussing with him the need to completely give up smoking and I discussed the risks benefits and alternatives in great detail and he says he would be compliant 5. To stop drinking alcohol 6. regular visits the wound center Plan Wound Cleansing: Gugel, Camil E. ([id number]) Wound #1 Right Toe Great: Cleanse wound with mild soap and water May Shower, gently pat wound dry prior to applying new dressing. Wound #2 Right  Toe Second: Cleanse wound with mild soap and water May Shower, gently pat wound dry prior to applying new dressing. Wound #3 Right Toe Third: Cleanse wound with mild soap and water May Shower, gently pat wound dry prior to applying new dressing. Wound #4 Right,Dorsal Foot: Cleanse wound with mild soap and water May Shower, gently pat wound dry prior to applying new dressing. Wound #5 Left Toe Great: Cleanse wound with mild soap and water May Shower, gently pat wound dry prior to applying new dressing. Wound #6 Left Toe Second: Cleanse wound with mild soap and water May Shower, gently pat wound dry prior to applying new dressing. Anesthetic: Wound #1 Right Toe Great: Topical Lidocaine 4% cream applied to wound bed prior to debridement Wound #2 Right Toe Second: Topical Lidocaine 4% cream applied to wound bed prior to debridement Wound #3 Right Toe Third: Topical Lidocaine 4% cream applied to wound bed prior to debridement Wound #4 Right,Dorsal Foot: Topical Lidocaine 4% cream applied to wound bed prior to debridement Wound #5 Left Toe Great: Topical Lidocaine 4% cream applied to wound bed  prior to debridement Wound #6 Left Toe Second: Topical Lidocaine 4% cream applied to wound bed prior to debridement Primary Wound Dressing: Wound #1 Right Toe Great: Aquacel Ag Wound #2 Right Toe Second: Aquacel Ag Wound #3 Right Toe Third: Aquacel Ag Wound #4 Right,Dorsal Foot: Aquacel Ag Wound #5 Left Toe Great: Aquacel Ag Wound #6 Left Toe Second: Aquacel Ag Secondary Dressing: Wound #1 Right Toe Great: Gauze and Kerlix/Conform Wound #2 Right Toe Second: Gauze and Kerlix/Conform Wound #3 Right Toe Third: Gauze and Kerlix/Conform Rorke, Milen E. ([id number]) Wound #4 Right,Dorsal Foot: Gauze and Kerlix/Conform Wound #5 Left Toe Great: Gauze and Kerlix/Conform Wound #6 Left Toe Second: Gauze and Kerlix/Conform Dressing Change Frequency: Wound #1 Right Toe Great: Change  dressing every day. Wound #2 Right Toe Second: Change dressing every day. Wound #3 Right Toe Third: Change dressing every day. Wound #4 Right,Dorsal Foot: Change dressing every day. Wound #5 Left Toe Great: Change dressing every day. Wound #6 Left Toe Second: Change dressing every day. Follow-up Appointments: Wound #1 Right Toe Great: Return Appointment in 1 week. Wound #2 Right Toe Second: Return Appointment in 1 week. Wound #3 Right Toe Third: Return Appointment in 1 week. Wound #4 Right,Dorsal Foot: Return Appointment in 1 week. Wound #5 Left Toe Great: Return Appointment in 1 week. Wound #6 Left Toe Second: Return Appointment in 1 week. Edema Control: Wound #1 Right Toe Great: Patient to wear own compression stockings Elevate legs to the level of the heart and pump ankles as often as possible Wound #2 Right Toe Second: Patient to wear own compression stockings Elevate legs to the level of the heart and pump ankles as often as possible Wound #3 Right Toe Third: Patient to wear own compression stockings Elevate legs to the level of the heart and pump ankles as often as possible Wound #4 Right,Dorsal Foot: Patient to wear own compression stockings Elevate legs to the level of the heart and pump ankles as often as possible Wound #5 Left Toe Great: Patient to wear own compression stockings Elevate legs to the level of the heart and pump ankles as often as possible Wound #6 Left Toe Second: Patient to wear own compression stockings Elevate legs to the level of the heart and pump ankles as often as possible Smithers, Deveon E. ([id number]) Additional Orders / Instructions: Wound #1 Right Toe Great: Stop Smoking - Please continue to stay away from alcohol Other: - Please add vitamin A, vitamin C, and Zinc supplements to your diet Wound #2 Right Toe Second: Stop Smoking - Please continue to stay away from alcohol Other: - Please add vitamin A, vitamin C, and Zinc supplements  to your diet Wound #3 Right Toe Third: Stop Smoking - Please continue to stay away from alcohol Other: - Please add vitamin A, vitamin C, and Zinc supplements to your diet Wound #4 Right,Dorsal Foot: Stop Smoking - Please continue to stay away from alcohol Other: - Please add vitamin A, vitamin C, and Zinc supplements to your diet Wound #5 Left Toe Great: Stop Smoking - Please continue to stay away from alcohol Other: - Please add vitamin A, vitamin C, and Zinc supplements to your diet Wound #6 Left Toe Second: Stop Smoking - Please continue to stay away from alcohol Other: - Please add vitamin A, vitamin C, and Zinc supplements to your diet Home Health: Wound #1 Right Toe Great: [place] for [place] Nurse may visit PRN to address patient s wound care needs. FACE  TO FACE ENCOUNTER: MEDICARE and MEDICAID PATIENTS: I certify that this patient is under my care and that I had a face-to-face encounter that meets the physician face-to-face encounter requirements with this patient on this date. The encounter with the patient was in whole or in part for the following MEDICAL CONDITION: (primary reason for [place]) MEDICAL NECESSITY: I certify, that based on my findings, NURSING services are a medically necessary home health service. HOME BOUND STATUS: I certify that my clinical findings support that this patient is homebound (i.e., Due to illness or injury, pt requires aid of supportive devices such as crutches, cane, wheelchairs, walkers, the use of special transportation or the assistance of another person to leave their place of residence. There is a normal inability to leave the home and doing so requires considerable and taxing effort. Other absences are for medical reasons / religious services and are infrequent or of short duration when for other reasons). If current dressing causes regression in wound condition, may D/C ordered dressing product/s and  apply Normal Saline Moist Dressing daily until next [place] / Other MD appointment. [place] of regression in wound condition at [phone]. Please direct any NON-WOUND related issues/requests for orders to patient's Primary Care Physician Wound #2 Right Toe Second: [place] for [place] Nurse may visit PRN to address patient s wound care needs. FACE TO FACE ENCOUNTER: MEDICARE and MEDICAID PATIENTS: I certify that this patient is under my care and that I had a face-to-face encounter that meets the physician face-to-face encounter requirements with this patient on this date. The encounter with the patient was in whole or in part for the following MEDICAL CONDITION: (primary reason for [place]) MEDICAL NECESSITY: I certify, that based on my findings, NURSING services are a medically necessary home health service. HOME BOUND STATUS: I certify that my clinical findings support that this patient is homebound (i.e., Due to illness or injury, pt requires aid of supportive devices such as crutches, cane, wheelchairs, walkers, the use of special transportation or the assistance of another person to leave their place of residence. There is a normal inability to leave the home and doing so requires considerable and taxing effort. Other absences are for medical reasons / religious services and are infrequent or of short duration when for other reasons). If current dressing causes regression in wound condition, may D/C ordered dressing product/s and apply Hogrefe, Jaquez E. ([id number]) Normal Saline Moist Dressing daily until next [place] / Other MD appointment. [place] of regression in wound condition at [phone]. Please direct any NON-WOUND related issues/requests for orders to patient's Primary Care Physician Wound #3 Right Toe Third: [place] for [place] Nurse may  visit PRN to address patient s wound care needs. FACE TO FACE ENCOUNTER: MEDICARE and MEDICAID PATIENTS: I certify that this patient is under my care and that I had a face-to-face encounter that meets the physician face-to-face encounter requirements with this patient on this date. The encounter with the patient was in whole or in part for the following MEDICAL CONDITION: (primary reason for [place]) MEDICAL NECESSITY: I certify, that based on my findings, NURSING services are a medically necessary home health service. HOME BOUND STATUS: I certify that my clinical findings support that this patient is homebound (i.e., Due to illness or injury, pt requires aid of supportive devices such as crutches, cane, wheelchairs, walkers, the use of special transportation or  the assistance of another person to leave their place of residence. There is a normal inability to leave the home and doing so requires considerable and taxing effort. Other absences are for medical reasons / religious services and are infrequent or of short duration when for other reasons). If current dressing causes regression in wound condition, may D/C ordered dressing product/s and apply Normal Saline Moist Dressing daily until next [place] / Other MD appointment. [place] of regression in wound condition at [phone]. Please direct any NON-WOUND related issues/requests for orders to patient's Primary Care Physician Wound #4 Right,Dorsal Foot: [place] for [place] Nurse may visit PRN to address patient s wound care needs. FACE TO FACE ENCOUNTER: MEDICARE and MEDICAID PATIENTS: I certify that this patient is under my care and that I had a face-to-face encounter that meets the physician face-to-face encounter requirements with this patient on this date. The encounter with the patient was in whole or in part for the following MEDICAL CONDITION: (primary reason  for [place]) MEDICAL NECESSITY: I certify, that based on my findings, NURSING services are a medically necessary home health service. HOME BOUND STATUS: I certify that my clinical findings support that this patient is homebound (i.e., Due to illness or injury, pt requires aid of supportive devices such as crutches, cane, wheelchairs, walkers, the use of special transportation or the assistance of another person to leave their place of residence. There is a normal inability to leave the home and doing so requires considerable and taxing effort. Other absences are for medical reasons / religious services and are infrequent or of short duration when for other reasons). If current dressing causes regression in wound condition, may D/C ordered dressing product/s and apply Normal Saline Moist Dressing daily until next [place] / Other MD appointment. [place] of regression in wound condition at [phone]. Please direct any NON-WOUND related issues/requests for orders to patient's Primary Care Physician Wound #5 Left Toe Great: [place] for [place] Nurse may visit PRN to address patient s wound care needs. FACE TO FACE ENCOUNTER: MEDICARE and MEDICAID PATIENTS: I certify that this patient is under my care and that I had a face-to-face encounter that meets the physician face-to-face encounter requirements with this patient on this date. The encounter with the patient was in whole or in part for the following MEDICAL CONDITION: (primary reason for [place]) MEDICAL NECESSITY: I certify, that based on my findings, NURSING services are a medically necessary home health service. HOME BOUND STATUS: I certify that my clinical findings support that this patient is homebound (i.e., Due to illness or injury, pt requires aid of supportive devices such as crutches, cane, wheelchairs, walkers, the use of special transportation or the  assistance of another person to leave their place of residence. There is a normal inability to leave the home and doing so requires considerable and taxing effort. Other absences are for medical reasons / religious services and are infrequent or of short duration when for other reasons). If current dressing causes regression in wound condition, may D/C ordered dressing product/s and apply Tramel, Breylan E. ([id number]) Normal Saline Moist Dressing daily until next [place] / Other MD appointment. [place] of regression in wound condition at [phone]. Please direct any NON-WOUND related issues/requests for orders to patient's Primary Care Physician Wound #6 Left Toe Second: [place] for [place] Nurse  may visit PRN to address patient s wound care needs. FACE TO FACE ENCOUNTER: MEDICARE and MEDICAID PATIENTS: I certify that this patient is under my care and that I had a face-to-face encounter that meets the physician face-to-face encounter requirements with this patient on this date. The encounter with the patient was in whole or in part for the following MEDICAL CONDITION: (primary reason for [place]) MEDICAL NECESSITY: I certify, that based on my findings, NURSING services are a medically necessary home health service. HOME BOUND STATUS: I certify that my clinical findings support that this patient is homebound (i.e., Due to illness or injury, pt requires aid of supportive devices such as crutches, cane, wheelchairs, walkers, the use of special transportation or the assistance of another person to leave their place of residence. There is a normal inability to leave the home and doing so requires considerable and taxing effort. Other absences are for medical reasons / religious services and are infrequent or of short duration when for other reasons). If current dressing causes regression in wound condition, may D/C ordered  dressing product/s and apply Normal Saline Moist Dressing daily until next [place] / Other MD appointment. [place] of regression in wound condition at [phone]. Please direct any NON-WOUND related issues/requests for orders to patient's Primary Care Physician Radiology ordered were: X-ray, foot - bilateral feet This 66 year old diabetic is rather noncompliant and does not look after his health very well. 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 He is also a heavy smoker and an alcoholic. After review today I have recommended: 1. Silver alginate to be applied over his wounds, and change daily after washing with soap and water 2. x-rays of both feet 3. Good control of his diabetes mellitus 4. I have spent over 3 minutes discussing with him the need to completely give up smoking and I discussed the risks benefits and alternatives in great detail and he says he would be compliant 5. To stop drinking alcohol 6. regular visits the wound center Notes Addendum: regarding his x-rays done today and x-ray of the left foot -- IMPRESSION: No objective evidence of osteomyelitis. There are soft tissue changes which may reflect cellulitis. X-ray of the right foot -- IMPRESSION:Findings compatible with cellulitis of the toes. No objective evidence of osteomyelitis is observed. Electronic Signature(s) ASEEL, UHDE ([id number]) Signed: 09/21/2016 2:33:55 PM By: [name] Fudge MD, FACS Previous Signature: 09/21/2016 9:52:20 AM Version By: [name] Fudge MD, FACS Entered By: [name] on 09/21/2016 14:33:55 F[place], [name] ([id number]) -------------------------------------------------------------------------------- ROS/PFSH Details Patient Name: VICTORIOUS, KUNDINGER 09/21/2016 8:00 Date  of Service: AM Medical Record [id number] Number: Patient Account Number: [id number] Date of Birth/Sex: [birth year] (66 y.o. Male) Treating RN: [name], Other Clinician: Primary Care Provider: Olivia [name] Treating Artemus Romanoff, [name], Provider/Extender: Referring Provider: Harless [name] in Treatment: 0 Information Obtained From Patient Wound History Do you currently have one or more open woundso Yes How many open wounds do you currently haveo 6 Approximately how long have you had your woundso 3 months How have you been treating your wound(s) until nowo ointment and bandage Has your wound(s) ever healed and then re-openedo No Have you had any lab work done in the past montho No Have you tested positive for an antibiotic resistant organism (MRSA, VRE)o No Have you tested positive for osteomyelitis (bone  infection)o No Have you had any tests for circulation on your legso Yes Who ordered the testo PCP Where was the test doneo [facility] Cardiovascular Complaints and Symptoms: Positive for: LE edema Negative for: Chest pain Medical History: Positive for: Congestive Heart Failure; Hypertension; Peripheral Venous Disease Negative for: Angina; Arrhythmia; Coronary Artery Disease; Deep Vein Thrombosis; Hypotension; Myocardial Infarction; Peripheral Arterial Disease; Phlebitis; Vasculitis Constitutional Symptoms (General Health) Complaints and Symptoms: No Complaints or Symptoms Eyes Complaints and Symptoms: No Complaints or Symptoms Medical History: Negative for: Cataracts; Glaucoma; Optic Neuritis Whatley, VIAAN KNIPPENBERG. ([id number]) Ear/Nose/Mouth/Throat Complaints and Symptoms: No Complaints or Symptoms Medical History: Negative for: Chronic sinus problems/congestion; Middle ear problems Hematologic/Lymphatic Complaints and Symptoms: No Complaints or Symptoms Medical History: Positive for: Anemia; Lymphedema Negative for: Hemophilia; Human Immunodeficiency  Virus; Sickle Cell Disease Respiratory Complaints and Symptoms: No Complaints or Symptoms Medical History: Negative for: Aspiration; Asthma; Chronic Obstructive Pulmonary Disease (COPD); Pneumothorax; Sleep Apnea; Tuberculosis Gastrointestinal Complaints and Symptoms: No Complaints or Symptoms Medical History: Negative for: Cirrhosis ; Colitis; Crohnos; Hepatitis A; Hepatitis B; Hepatitis C Endocrine Complaints and Symptoms: No Complaints or Symptoms Medical History: Positive for: Type II Diabetes Treated with: Oral agents Blood sugar tested every day: Yes Tested : QD Genitourinary Complaints and Symptoms: No Complaints or Symptoms Medical History: Hiraldo, Trusten E. ([id number]) Negative for: End Stage Renal Disease Immunological Complaints and Symptoms: No Complaints or Symptoms Medical History: Negative for: Lupus Erythematosus; Raynaudos; Scleroderma Integumentary (Skin) Complaints and Symptoms: No Complaints or Symptoms Medical History: Negative for: History of Burn; History of pressure wounds Musculoskeletal Complaints and Symptoms: No Complaints or Symptoms Medical History: Negative for: Gout; Rheumatoid Arthritis; Osteoarthritis; Osteomyelitis Neurologic Complaints and Symptoms: No Complaints or Symptoms Medical History: Positive for: Neuropathy Negative for: Dementia; Quadriplegia; Paraplegia; Seizure Disorder Oncologic Medical History: Past Medical History Notes: 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 Psychiatric Complaints and Symptoms: No Complaints or Symptoms Immunizations Pneumococcal Vaccine: Received Pneumococcal Vaccination: No Immunization Notes: up to date KENYAN, KARNES ([id number]) Family and Social History Current every day smoker; Marital Status - Widowed; Alcohol Use: Daily - quit drinking about a week ago; Drug Use: No History; Caffeine Use: Moderate; Financial Concerns: No; Food, Clothing or Shelter  Needs: No; Support System Lacking: No; Transportation Concerns: No; Advanced Directives: No; Patient does not want information on Advanced Directives Physician Affirmation I have reviewed and agree with the above information. Electronic Signature(s) Signed: 09/21/2016 4:13:16 PM By: [name] Fudge MD, FACS Signed: 09/21/2016 4:38:01 PM By: Montey Hora Entered By: [name] on 09/21/2016 09:24:45 Genna, [name] ([id number]) -------------------------------------------------------------------------------- SuperBill Details Patient Name: Fjeld, Kendra E. Date of Service: 09/21/2016 Medical Record Number: [id number] Patient Account Number: [id number] Date of Birth/Sex: 11-09-1950 (66 y.o. Male) Treating RN: Montey Hora Primary Care Provider: Myrtie Hawk Other Clinician: Referring Provider: Myrtie Hawk Treating Provider/Extender: Frann Rider in Treatment: 0 Diagnosis Coding ICD-10 Codes Code Description E11.621 Type 2 diabetes mellitus with foot ulcer I87.313 Chronic venous hypertension (idiopathic) with ulcer of bilateral lower extremity I89.0 Lymphedema, not elsewhere classified L97.522 Non-pressure chronic ulcer of other part of left foot with fat layer exposed L97.512 Non-pressure chronic ulcer of other part of right foot with fat layer exposed F17.218 Nicotine dependence, cigarettes, 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 abuse with unspecified alcohol-induced disorder Facility Procedures CPT4 Code Description: 53299242 99215 - WOUND CARE VISIT-LEV 5 EST PT Modifier: Quantity: 1 CPT4 Code Description: [id number] 99406-SMOKING CESSATION 3-10MINS ICD-10 Description Diagnosis E11.621 Type 2 diabetes mellitus with foot ulcer  F17.218 Nicotine dependence, cigarettes, with other nicotine- L97.522 Non-pressure chronic ulcer of other part  of left foot L97.512 Non-pressure chronic ulcer of other part of right foo Modifier: induced disorde with  fat layer t with fat laye Quantity: 1 rs exposed r exposed Physician Procedures CPT4: Description Modifier Quantity Code [id number] 99213 - WC PHYS LEVEL 3 - EST PT 1 ICD-10 Description Diagnosis E11.621 Type 2 diabetes mellitus with foot ulcer I87.313 Chronic venous hypertension (idiopathic) with ulcer of bilateral lower extremity  I89.0 Lymphedema, not elsewhere classified L97.522 Non-pressure chronic ulcer of other part of left foot with fat layer exposed Nakagawa, Raymond E. ([id number]) Electronic Signature(s) Signed: 09/21/2016 10:13:01 AM By: Montey Hora Signed: 09/21/2016 4:13:16 PM By: [name] Fudge MD, FACS Previous Signature: 09/21/2016 10:00:35 AM Version By: [name] Fudge MD, FACS Previous Signature: 09/21/2016 10:00:15 AM Version By: [name] Fudge MD, FACS Previous Signature: 09/21/2016 9:52:36 AM Version By: [name] Fudge MD, FACS Entered By: Montey Hora on 09/21/2016 10:13:01

## 2016-09-24 ENCOUNTER — Ambulatory Visit (INDEPENDENT_AMBULATORY_CARE_PROVIDER_SITE_OTHER): Payer: Medicare HMO | Admitting: Vascular Surgery

## 2016-09-24 ENCOUNTER — Other Ambulatory Visit: Payer: Self-pay

## 2016-09-24 DIAGNOSIS — K7031 Alcoholic cirrhosis of liver with ascites: Secondary | ICD-10-CM

## 2016-09-24 NOTE — Discharge Instructions (Signed)
Paracentesis, Care After °Refer to this sheet in the next few weeks. These instructions provide you with information about caring for yourself after your procedure. Your health care provider may also give you more specific instructions. Your treatment has been planned according to current medical practices, but problems sometimes occur. Call your health care provider if you have any problems or questions after your procedure. °What can I expect after the procedure? °After your procedure, it is common to have a small amount of clear fluid coming from the puncture site. °Follow these instructions at home: °· Return to your normal activities as told by your health care provider. Ask your health care provider what activities are safe for you. °· Take over-the-counter and prescription medicines only as told by your health care provider. °· Do not take baths, swim, or use a hot tub until your health care provider approves. °· Follow instructions from your health care provider about: °? How to take care of your puncture site. °? When and how you should change your bandage (dressing). °? When you should remove your dressing. °· Check your puncture area every day signs of infection. Watch for: °? Redness, swelling, or pain. °? Fluid, blood, or pus. °· Keep all follow-up visits as told by your health care provider. This is important. °Contact a health care provider if: °· You have redness, swelling, or pain at your puncture site. °· You start to have more clear fluid coming from your puncture site. °· You have blood or pus coming from your puncture site. °· You have chills. °· You have a fever. °Get help right away if: °· You develop chest pain or shortness of breath. °· You develop increasing pain, discomfort, or swelling in your abdomen. °· You feel dizzy or light-headed or you pass out. °This information is not intended to replace advice given to you by your health care provider. Make sure you discuss any questions you  have with your health care provider. °Document Released: 08/10/2014 Document Revised: 09/01/2015 Document Reviewed: 06/08/2014 °[employer] Interactive Patient Education © 2018 [employer] ° °

## 2016-09-24 NOTE — Progress Notes (Signed)
[name], [name] ([id number]) Visit Report for 09/21/2016 Allergy List Details Patient Name: [name], [name] 09/21/2016 8:00 Date of Service: AM Medical Record [id number] Number: Patient Account Number: [id number] Date of Birth/Sex: 07/14/1950 (66 y.o. Male) Treating RN: [name], Other Clinician: Primary Care [name]: [name] Referring [name]: [name] [name]/Extender: [name] in Treatment: 0 Allergies Active Allergies No Known Drug Allergies Allergy Notes Electronic Signature(s) Signed: 09/21/2016 4:38:01 PM By: [name] Entered By: [name] on 09/21/2016 08:26:58 [name] ([id number]) -------------------------------------------------------------------------------- Arrival Information Details Patient Name: [name], [name] 09/21/2016 8:00 Date of Service: AM Medical Record [id number] Number: Patient Account Number: [id number] Date of Birth/Sex: [birth year] (66 y.o. Male) Treating RN: [name], Other Clinician: Primary Care Tyarra Nolton: [name] Referring Shardae Kleinman: [name] Asra Gambrel/Extender: [name] in Treatment: 0 Visit Information Patient Arrived: Ambulatory Arrival Time: 08:23 Accompanied By: son Transfer Assistance: None Patient Identification Verified: Yes Secondary Verification Process Yes Completed: Patient Has Alerts: Yes Patient Alerts: DMII Electronic Signature(s) Signed: 09/21/2016 4:38:01 PM By: [name] Entered By: [name] on 09/21/2016 08:24:05 [name] ([id number]) -------------------------------------------------------------------------------- Clinic Level of Care Assessment Details Patient Name: [name], [name] 09/21/2016 8:00 Date of Service: AM Medical Record [id number] Number: Patient Account Number: [id number] Date of Birth/Sex: 1950/05/07 (66 y.o. Male) Treating RN: [name], Other  Clinician: Primary Care Winter Trefz: [name] Referring Adamaris King: [name] Kaly Mcquary/Extender: [name] in Treatment: 0 Clinic Level of Care Assessment Items TOOL 2 Quantity Score []  - Use when only an EandM is performed on the INITIAL visit 0 ASSESSMENTS - Nursing Assessment / Reassessment X - General Physical Exam (combine w/ comprehensive assessment (listed just 1 20 below) when performed on new pt. evals) X - Comprehensive Assessment (HX, ROS, Risk Assessments, Wounds Hx, etc.) 1 25 ASSESSMENTS - Wound and Skin Assessment / Reassessment []  - Simple Wound Assessment / Reassessment - one wound 0 X - Complex Wound Assessment / Reassessment - multiple wounds 6 5 []  - Dermatologic / Skin Assessment (not related to wound area) 0 ASSESSMENTS - Ostomy and/or Continence Assessment and Care []  - Incontinence Assessment and Management 0 []  - Ostomy Care Assessment and Management (repouching, etc.) 0 PROCESS - Coordination of Care X - Simple Patient / Family Education for ongoing care 1 15 []  - Complex (extensive) Patient / Family Education for ongoing care 0 []  - Staff obtains Programmer, systems, Records, Test Results / Process Orders 0 []  - Staff telephones HHA, Nursing Homes / Clarify orders / etc 0 []  - Routine Transfer to another Facility (non-emergent condition) 0 []  - Routine Hospital Admission (non-emergent condition) 0 X - New Admissions / Biomedical engineer / Ordering NPWT, Apligraf, etc. 1 15 []  - Emergency Hospital Admission (emergent condition) 0 [name], [name] ([id number]) X - Simple Discharge Coordination 1 10 []  - Complex (extensive) Discharge Coordination 0 PROCESS - Special Needs []  - Pediatric / Minor Patient Management 0 []  - Isolation Patient Management 0 []  - Hearing / Language / Visual special needs 0 []  - Assessment of Community assistance (transportation, D/C planning, etc.) 0 []  - Additional assistance / Altered mentation 0 []  - Support Surface(s)  Assessment (bed, cushion, seat, etc.) 0 INTERVENTIONS - Wound Cleansing / Measurement X - Wound Imaging (photographs - any number of wounds) 1 5 []  - Wound Tracing (instead of photographs) 0 []  - Simple Wound Measurement - one wound 0 X - Complex Wound Measurement - multiple wounds 6 5 []  - Simple Wound Cleansing -  one wound 0 X - Complex Wound Cleansing - multiple wounds 6 5 INTERVENTIONS - Wound Dressings X - Small Wound Dressing one or multiple wounds 6 10 []  - Medium Wound Dressing one or multiple wounds 0 []  - Large Wound Dressing one or multiple wounds 0 []  - Application of Medications - injection 0 INTERVENTIONS - Miscellaneous []  - External ear exam 0 []  - Specimen Collection (cultures, biopsies, blood, body fluids, etc.) 0 []  - Specimen(s) / Culture(s) sent or taken to Lab for analysis 0 []  - Patient Transfer (multiple staff / Harrel Lemon Lift / Similar devices) 0 []  - Simple Staple / Suture removal (25 or less) 0 [name], [name] ([id number]) []  - Complex Staple / Suture removal (26 or more) 0 []  - Hypo / Hyperglycemic Management (close monitor of Blood Glucose) 0 []  - Ankle / Brachial Index (ABI) - do not check if billed separately 0 Has the patient been seen at the hospital within the last three years: Yes Total Score: 240 Level Of Care: New/Established - Level 5 Electronic Signature(s) Signed: 09/21/2016 4:38:01 PM By: [name] Entered By: [name] on 09/21/2016 10:12:40 [name] ([id number]) -------------------------------------------------------------------------------- Encounter Discharge Information Details Patient Name: [name] Brittle E. 09/21/2016 8:00 Date of Service: AM Medical Record [id number] Number: Patient Account Number: [id number] Date of Birth/Sex: 10-Jan-1951 (66 y.o. Male) Treating RN: [name], Other Clinician: Primary Care [name]: [name] Referring [name]: CLINE,  [name] Zayde Stroupe/Extender: [name] in Treatment: 0 Encounter Discharge Information Items Discharge Pain Level: 0 Discharge Condition: Stable Ambulatory Status: Ambulatory Discharge Destination: Home Transportation: Private Auto Accompanied By: son in law Schedule Follow-up Appointment: Yes Medication Reconciliation completed and provided to Patient/Care No Mirko Tailor: Provided on Clinical Summary of Care: 09/21/2016 Form Type Recipient Paper Patient ED Electronic Signature(s) Signed: 09/21/2016 10:21:23 AM By: [name] Previous Signature: 09/21/2016 9:42:58 AM Version By: Ruthine Dose Entered By: [name] on 09/21/2016 10:21:22 Corso, [name] ([id number]) -------------------------------------------------------------------------------- Lower Extremity Assessment Details Patient Name: KORTNEY, SCHOENFELDER 09/21/2016 8:00 Date of Service: AM Medical Record [id number] Number: Patient Account Number: [id number] Date of Birth/Sex: 1950-11-18 (66 y.o. Male) Treating RN: Anastasio Champion, Other Clinician: Primary Care Neri Samek: [name] Referring Khala Tarte: [name] Deena Shaub/Extender: [name] in Treatment: 0 Edema Assessment Assessed: [Left: No] [Right: No] Edema: [Left: Yes] [Right: Yes] Calf Left: Right: Point of Measurement: 36 cm From Medial Instep 38.3 cm 38.8 cm Ankle Left: Right: Point of Measurement: 11 cm From Medial Instep 28 cm 29.2 cm Vascular Assessment Pulses: Dorsalis Pedis Palpable: [Left:No] [Right:No] Doppler Audible: [Left:Yes] [Right:Yes] Posterior Tibial Extremity colors, hair growth, and conditions: Extremity Color: [Left:Dusky] [Right:Dusky] Temperature of Extremity: [Left:Warm] [Right:Warm] Capillary Refill: [Left:> 3 seconds] [Right:> 3 seconds] Toe Nail Assessment Left: Right: Thick: Yes Yes Discolored: Yes Yes Deformed: Yes Yes Improper Length and Hygiene: Yes Yes Electronic Signature(s) Signed:  09/24/2016 3:47:26 PM By: Alric Quan Entered By: Alric Quan on 09/21/2016 08:59:23 [name], [name] PODGORSKI ([id number]) Cordle, Engelbert EMarland Kitchen ([id number]) -------------------------------------------------------------------------------- Multi Wound Chart Details Patient Name: [name] Brittle E. 09/21/2016 8:00 Date of Service: AM Medical Record [id number] Number: Patient Account Number: [id number] Date of Birth/Sex: 05-May-1950 (66 y.o. 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] Comorbid History: [1:Anemia, Lymphedema, Congestive Heart Failure, Congestive Heart Failure, Congestive Heart Failure, Hypertension, Peripheral Hypertension, Peripheral Hypertension, Peripheral Venous Disease, Type II Venous Disease, Type II Venous  Disease, Type II Diabetes, Neuropathy] [2:Anemia, Lymphedema, Diabetes, Neuropathy] [3:Anemia, Lymphedema, Diabetes, Neuropathy] Date Acquired: [1:06/11/2016] [2:06/11/2016] [3:06/11/2016] [name] of Treatment: [1:0] [2:0] [3:0] Wound Status: [1:Open] [2:Open] [3:Open] Pending Amputation on Yes [2:Yes] [3:Yes] Presentation: Measurements L x W x D 8x7.5x0.1 [2:2.3x1.7x0.1] [3:0.8x0.8x0.1] (cm) Area (cm) : [1:47.124] [2:3.071] [3:0.503] Volume (cm) : [1:4.712] [2:0.307] [3:0.05] Classification: [1:Full Thickness Without Exposed Support  Structures] [2:Full Thickness Without Exposed Support Structures] [3:Full Thickness Without Exposed Support Structures] HBO Classification: [1:Grade 1] [2:Grade 1] [3:Grade 1] Exudate Amount: [1:Large] [2:Large] [3:Large] Exudate Type: [1:Serous] [2:Serous] [3:Serous] Exudate Color: [1:amber] [2:amber] [3:amber] Foul Odor After [1:Yes] [2:Yes] [3:Yes] Cleansing: [1:No] [2:No] [3:No] Odor Anticipated Due to Product Use: Wound Margin: Flat and Intact Flat and Intact Flat and Intact Granulation Amount: Medium (34-66%) Medium (34-66%) Medium (34-66%) Granulation Quality: Pink Pink Pink Necrotic Amount: Medium (34-66%) Medium (34-66%) Medium (34-66%) Exposed Structures: Fascia: No Fascia: No Fascia: No Fat Layer (Subcutaneous Fat Layer (Subcutaneous Fat Layer (Subcutaneous Tissue) Exposed: No Tissue) Exposed: No Tissue) Exposed: No Tendon: No Tendon: No Tendon: No Muscle: No Muscle: No Muscle: No Joint: No Joint: No Joint: No Bone: No Bone: No Bone: No Epithelialization: Small (1-33%) Small (1-33%) Small (1-33%) Periwound Skin Texture: Excoriation: Yes Excoriation: Yes Excoriation: Yes Induration: No Induration: No Induration: No Callus: No Callus: No Callus: No Crepitus: No Crepitus: No Crepitus: No Rash: No Rash: No Rash: No Scarring: No Scarring: No Scarring: No Periwound Skin Maceration: Yes Maceration: Yes Maceration: Yes Moisture: Dry/Scaly: No Dry/Scaly: No Dry/Scaly: No Periwound Skin Color: Atrophie Blanche: No Atrophie Blanche: No Atrophie Blanche: No Cyanosis: No Cyanosis: No Cyanosis: No Ecchymosis: No Ecchymosis: No Ecchymosis: No Erythema: No Erythema: No Erythema: No Hemosiderin Staining: No Hemosiderin Staining: No Hemosiderin Staining: No Mottled: No Mottled: No Mottled: No Pallor: No Pallor: No Pallor: No Rubor: No Rubor: No Rubor: No Temperature: No Abnormality No Abnormality No Abnormality Tenderness on No No  No Palpation: Wound Preparation: Ulcer Cleansing: Ulcer Cleansing: Ulcer Cleansing: Rinsed/Irrigated with Rinsed/Irrigated with Rinsed/Irrigated with Saline Saline Saline Topical Anesthetic Topical Anesthetic Topical Anesthetic Applied: Other: lidocaine Applied: Other: lidocaine Applied: Other: lidocaine 4% 4% 4% Wound Number: 4 [phone] 6  Photos: No Photos No Photos No Photos Wound Location: Right Foot - Dorsal Left Toe Great Left Toe Second Wounding Event: Gradually Appeared Gradually Appeared Gradually Appeared 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 ([id number]) Venous Disease, Type II Venous Disease, Type II Venous Disease, Type II Diabetes, Neuropathy Diabetes, Neuropathy Diabetes, Neuropathy Date Acquired: 06/11/2016 06/11/2016 06/11/2016 [name] of Treatment: 0 0 0 Wound Status: Open Open Open Pending Amputation on Yes Yes Yes Presentation: Measurements L x W x D 2.1x2.7x0.1 3.5x5.2x0.1 1.6x1.7x0.1 (cm) Area (cm) : 4.453 14.294 2.136 Volume (cm) : 0.445 1.429 0.214 Classification: Full Thickness Without Full Thickness Without Full Thickness Without Exposed Support Exposed Support Exposed Support Structures Structures Structures HBO Classification: Grade 1 Grade 1 Grade 1 Exudate Amount: Large Large Large Exudate Type: Serous Serous Serous Exudate Color: amber Physiological scientist Foul Odor After Yes Yes  Yes Cleansing: Odor Anticipated Due to No No No Product Use: Wound Margin: Flat and Intact Flat and Intact Flat and Intact Granulation Amount: Medium (34-66%) Medium (34-66%) Medium (34-66%) Granulation Quality: Pink Pink Pink Necrotic Amount: Medium (34-66%) Medium (34-66%)  Medium (34-66%) Exposed Structures: Fascia: No Fascia: No Fascia: No Fat Layer (Subcutaneous Fat Layer (Subcutaneous Fat Layer (Subcutaneous Tissue) Exposed: No Tissue) Exposed: No Tissue) Exposed: No Tendon: No Tendon: No Tendon: No Muscle: No Muscle: No Muscle: No Joint: No Joint: No Joint: No Bone: No Bone: No Bone: No Epithelialization: Small (1-33%) Small (1-33%) Small (1-33%) Periwound Skin Texture: Excoriation: Yes Excoriation: Yes Excoriation: Yes Induration: No Induration: No Induration: No Callus: No Callus: No Callus: No Crepitus: No Crepitus: No Crepitus: No Rash: No Rash: No Rash: No Scarring: No Scarring: No Scarring: No Periwound Skin Maceration: Yes Maceration: Yes Maceration: Yes Moisture: Dry/Scaly: No Dry/Scaly: No Dry/Scaly: No Periwound Skin Color: Atrophie Blanche: No Atrophie Blanche: No Atrophie Blanche: No Cyanosis: No Cyanosis: No Cyanosis: No Ecchymosis: No Ecchymosis: No Ecchymosis: No Erythema: No Erythema: No Erythema: No Hemosiderin Staining: No Hemosiderin Staining: No Hemosiderin Staining: No Mottled: No Mottled: No Mottled: No Pallor: No Pallor: No Pallor: No Rubor: No Rubor: No Rubor: No Ahrendt, Willson E. ([id number]) Temperature: No Abnormality No Abnormality No Abnormality Tenderness on No No No Palpation: Wound Preparation: Ulcer Cleansing: Ulcer Cleansing: Ulcer Cleansing: Rinsed/Irrigated with Rinsed/Irrigated with Rinsed/Irrigated with Saline Saline Saline Topical Anesthetic Topical Anesthetic Topical Anesthetic Applied: Other: lidocaine Applied: Other: lidocaine Applied: Other: lidocaine 4% 4% 4% Treatment Notes Electronic Signature(s) Signed: 09/21/2016 9:26:35 AM By: Christin Fudge MD, FACS Entered By: Christin Fudge on 09/21/2016 09:26:35 Wonnacott, [name] ([id number]) -------------------------------------------------------------------------------- [place] Details Patient  Name: [name], [name] 09/21/2016 8:00 Date of Service: AM Medical Record [id number] Number: Patient Account Number: [id number] Date of Birth/Sex: 11-26-1950 (66 y.o. Male) Treating RN: [name], Other Clinician: Primary Care Yoshiharu Brassell: [name] Referring Shannell Mikkelsen: [name] Jonae Renshaw/Extender: [name] in Treatment: 0 Active Inactive ` Abuse / Safety / Falls / Self Care Management Nursing Diagnoses: Potential for falls Goals: Patient will remain injury free related to falls Date Initiated: 09/21/2016 Target Resolution Date: 11/30/2016 Goal Status: Active Interventions: Assess fall risk on admission and as needed Notes: ` Nutrition Nursing Diagnoses: Potential for alteratiion in Nutrition/Potential for imbalanced nutrition Goals: Patient/caregiver agrees to and verbalizes understanding of need to use nutritional supplements and/or vitamins as prescribed Date Initiated: 09/21/2016 Target Resolution Date: 11/30/2016 Goal Status: Active Interventions: Assess patient nutrition upon admission and as needed per policy Notes: ` Orientation to the [facility]. ([id number]) Nursing Diagnoses: Knowledge deficit related to the wound healing center program Goals: Patient/caregiver will verbalize understanding of the [place] Date Initiated: 09/21/2016 Target Resolution Date: 11/30/2016 Goal Status: Active Interventions: Provide education on orientation to the wound center Notes: ` Wound/Skin Impairment Nursing Diagnoses: Knowledge deficit related to smoking impact on wound healing Goals: Ulcer/skin breakdown will have a volume reduction of 30% by week 4 Date Initiated: 09/21/2016 Target Resolution Date: 11/30/2016 Goal Status: Active Ulcer/skin breakdown will have a volume reduction of 50% by week 8 Date Initiated: 09/21/2016 Target Resolution Date: 11/30/2016 Goal Status: Active Ulcer/skin  breakdown will have a volume reduction of 80% by week 12 Date Initiated: 09/21/2016 Target Resolution Date: 11/30/2016 Goal Status: Active Ulcer/skin breakdown will heal within 14 [name] Date Initiated: 09/21/2016 Target Resolution Date: 11/30/2016 Goal Status: Active Interventions: Assess patient/caregiver ability to obtain necessary supplies  Assess patient/caregiver ability to perform ulcer/skin care regimen upon admission and as needed Assess ulceration(s) every visit Notes: Electronic Signature(s) Signed: 09/21/2016 4:38:01 PM By: [name] Entered By: [name] on 09/21/2016 09:14:50 Lariviere, [name] ([id number]) -------------------------------------------------------------------------------- Pain Assessment Details Patient Name: [name], [name] 09/21/2016 8:00 Date of Service: AM Medical Record [id number] Number: Patient Account Number: [id number] Date of Birth/Sex: [birth year] (66 y.o. Male) Treating RN: [name], Other Clinician: Primary Care Niko Jakel: [name] Referring Janea Schwenn: [name] Geanna Divirgilio/Extender: [name] in Treatment: 0 Active Problems Location of Pain Severity and Description of Pain Patient Has Paino No Site Locations Pain Management and Medication Current Pain Management: Notes Topical or injectable lidocaine is offered to patient for acute pain when surgical debridement is performed. If needed, Patient is instructed to use over the counter pain medication for the following 24-48 hours after debridement. Wound care MDs do not prescribed pain medications. Patient has chronic pain or uncontrolled pain. Patient has been instructed to make an appointment with their Primary Care Physician for pain management. Electronic Signature(s) Signed: 09/21/2016 4:38:01 PM By: [name] Entered By: [name] on 09/21/2016 08:24:21 Thrall, [name]  ([id number]) -------------------------------------------------------------------------------- Patient/Caregiver Education Details Patient Name: [name], [name] 09/21/2016 8:00 Date of Service: AM Medical Record [id number] Number: Patient Account Number: [id number] Date of Birth/Gender: [birth year] (66 y.o. Male) Treating RN: [name], Other Clinician: Primary Care Physician: [name] Referring Physician: CLINE, [name] Physician/Extender: [name] in Treatment: 0 Education Assessment Education Provided To: Patient Education Topics Provided Nutrition: Handouts: Other: alcohol and wound healing and blood vessels Methods: Explain/Verbal Responses: State content correctly Smoking and Wound Healing: Handouts: Smoking and Wound Healing Methods: Explain/Verbal Responses: State content correctly Wound/Skin Impairment: Handouts: Other: wound care as ordered Methods: Demonstration, Explain/Verbal Responses: State content correctly Electronic Signature(s) Signed: 09/21/2016 4:38:01 PM By: [name] Entered By: [name] on 09/21/2016 10:22:28 Diosdado, [name] ([id number]) -------------------------------------------------------------------------------- Wound Assessment Details Patient Name: [name] Brittle E. 09/21/2016 8:00 Date of Service: AM Medical Record [id number] Number: Patient Account Number: [id number] Date of Birth/Sex: [birth year] (67 y.o. Male) Treating RN: [name], Other Clinician: Primary Care Yehya Brendle: Aundria Rud, Errol Referring Shonta Bourque: [name] Jodine Muchmore/Extender: [name] in Treatment: 0 Wound Status Wound Number: 1 Primary Arterial Insufficiency Ulcer Etiology: Wound Location: Right Toe Great Wound Open Wounding Event: Gradually Appeared Status: Date Acquired: 06/11/2016 Comorbid Anemia, Lymphedema, Congestive [name] Of Treatment: 0 History: Heart Failure, Hypertension,  Peripheral Clustered Wound: No Venous Disease, Type II Diabetes, Pending Amputation On Presentation Neuropathy Photos Photo Uploaded By: [name] on 09/21/2016 14:36:04 Wound Measurements Length: (cm) 8 Width: (cm) 7.5 Depth: (cm) 0.1 Area: (cm) 47.124 Volume: (cm) 4.712 % Reduction in Area: % Reduction in Volume: Epithelialization: Small (1-33%) Tunneling: No Undermining: No Wound Description Full Thickness Without Foul Odor After Classification: Exposed Support Structures Due to Product Diabetic Severity Slough/Fibrino Grade 1 (Wagner): Wound Margin: Flat and Intact Exudate Amount: Large Exudate Type: Serous Exudate Color: amber Sinquefield, Dorean E. ([id number]) Cleansing: Yes Use: No Yes Wound Bed Granulation Amount: Medium (34-66%) Exposed Structure Granulation Quality: Pink Fascia Exposed: No Necrotic Amount: Medium (34-66%) Fat Layer (Subcutaneous Tissue) Exposed: No Necrotic Quality: Adherent Slough Tendon Exposed: No Muscle Exposed: No Joint Exposed: No Bone Exposed: No Periwound Skin Texture Texture Color No Abnormalities Noted: No No Abnormalities Noted: No Callus: No Atrophie Blanche: No Crepitus: No Cyanosis: No Excoriation: Yes Ecchymosis: No Induration: No Erythema: No Rash: No Hemosiderin Staining: No Scarring: No Mottled: No Pallor:  No Moisture Rubor: No No Abnormalities Noted: No Dry / Scaly: No Temperature / Pain Maceration: Yes Temperature: No Abnormality Wound Preparation Ulcer Cleansing: Rinsed/Irrigated with Saline Topical Anesthetic Applied: Other: lidocaine 4%, Electronic Signature(s) Signed: 09/21/2016 4:38:01 PM By: [name] Entered By: [name] on 09/21/2016 08:53:36 Coletti, [name] ([id number]) -------------------------------------------------------------------------------- Wound Assessment Details Patient Name: [name], [name] 09/21/2016 8:00 Date of Service: AM Medical  Record [id number] Number: Patient Account Number: [id number] Date of Birth/Sex: Mar 12, 1951 (66 y.o. Male) Treating RN: [name], Other Clinician: Primary Care Yandiel Bergum: [name] Referring Edrees Valent: [name] Narjis Mira/Extender: [name] in Treatment: 0 Wound Status Wound Number: 2 Primary Arterial Insufficiency Ulcer Etiology: Wound Location: Right Toe Second Wound Open Wounding Event: Gradually Appeared Status: Date Acquired: 06/11/2016 Comorbid Anemia, Lymphedema, Congestive [name] Of Treatment: 0 History: Heart Failure, Hypertension, Peripheral Clustered Wound: No Venous Disease, Type II Diabetes, Pending Amputation On Presentation Neuropathy Photos Photo Uploaded By: [name] on 09/21/2016 14:36:36 Wound Measurements Length: (cm) 2.3 Width: (cm) 1.7 Depth: (cm) 0.1 Area: (cm) 3.071 Volume: (cm) 0.307 % Reduction in Area: % Reduction in Volume: Epithelialization: Small (1-33%) Tunneling: No Undermining: No Wound Description Full Thickness Without Foul Odor After Classification: Exposed Support Structures Due to Product Diabetic Severity Slough/Fibrino Grade 1 (Wagner): Wound Margin: Flat and Intact Exudate Amount: Large Exudate Type: Serous Exudate Color: amber [name], Tailor E. ([id number]) Cleansing: Yes Use: No Yes Wound Bed Granulation Amount: Medium (34-66%) Exposed Structure Granulation Quality: Pink Fascia Exposed: No Necrotic Amount: Medium (34-66%) Fat Layer (Subcutaneous Tissue) Exposed: No Necrotic Quality: Adherent Slough Tendon Exposed: No Muscle Exposed: No Joint Exposed: No Bone Exposed: No Periwound Skin Texture Texture Color No Abnormalities Noted: No No Abnormalities Noted: No Callus: No Atrophie Blanche: No Crepitus: No Cyanosis: No Excoriation: Yes Ecchymosis: No Induration: No Erythema: No Rash: No Hemosiderin Staining: No Scarring: No Mottled: No Pallor:  No Moisture Rubor: No No Abnormalities Noted: No Dry / Scaly: No Temperature / Pain Maceration: Yes Temperature: No Abnormality Wound Preparation Ulcer Cleansing: Rinsed/Irrigated with Saline Topical Anesthetic Applied: Other: lidocaine 4%, Electronic Signature(s) Signed: 09/21/2016 4:38:01 PM By: [name] Entered By: [name] on 09/21/2016 08:54:47 Sayler, [name] ([id number]) -------------------------------------------------------------------------------- Wound Assessment Details Patient Name: KAIKOA, [name] 09/21/2016 8:00 Date of Service: AM Medical Record [id number] Number: Patient Account Number: [id number] Date of Birth/Sex: 1950-06-07 (66 y.o. Male) Treating RN: [name], Other Clinician: Primary Care Daurice Ovando: [name] Referring Soraida Vickers: [name] Belvie Iribe/Extender: [name] in Treatment: 0 Wound Status Wound Number: 3 Primary Arterial Insufficiency Ulcer Etiology: Wound Location: Right Toe Third Wound Open Wounding Event: Gradually Appeared Status: Date Acquired: 06/11/2016 Comorbid Anemia, Lymphedema, Congestive [name] Of Treatment: 0 History: Heart Failure, Hypertension, Peripheral Clustered Wound: No Venous Disease, Type II Diabetes, Pending Amputation On Presentation Neuropathy Photos Photo Uploaded By: [name] on 09/21/2016 14:36:36 Wound Measurements Length: (cm) 0.8 Width: (cm) 0.8 Depth: (cm) 0.1 Area: (cm) 0.503 Volume: (cm) 0.05 % Reduction in Area: % Reduction in Volume: Epithelialization: Small (1-33%) Tunneling: No Undermining: No Wound Description Full Thickness Without Foul Odor After Classification: Exposed Support Structures Due to Product Diabetic Severity Slough/Fibrino Grade 1 (Wagner): Wound Margin: Flat and Intact Exudate Amount: Large Exudate Type: Serous Exudate Color: amber Pasch, Nyxon E. ([id number]) Cleansing: Yes Use: No Yes Wound Bed Granulation  Amount: Medium (34-66%) Exposed Structure Granulation Quality: Pink Fascia Exposed: No Necrotic Amount: Medium (34-66%) Fat Layer (Subcutaneous Tissue) Exposed: No Necrotic Quality: Adherent Slough Tendon Exposed: No Muscle Exposed:  No Joint Exposed: No Bone Exposed: No Periwound Skin Texture Texture Color No Abnormalities Noted: No No Abnormalities Noted: No Callus: No Atrophie Blanche: No Crepitus: No Cyanosis: No Excoriation: Yes Ecchymosis: No Induration: No Erythema: No Rash: No Hemosiderin Staining: No Scarring: No Mottled: No Pallor: No Moisture Rubor: No No Abnormalities Noted: No Dry / Scaly: No Temperature / Pain Maceration: Yes Temperature: No Abnormality Wound Preparation Ulcer Cleansing: Rinsed/Irrigated with Saline Topical Anesthetic Applied: Other: lidocaine 4%, Electronic Signature(s) Signed: 09/21/2016 4:38:01 PM By: [name] Entered By: [name] on 09/21/2016 08:55:57 Huizenga, [name] ([id number]) -------------------------------------------------------------------------------- Wound Assessment Details Patient Name: FABRIZIO, FILIP 09/21/2016 8:00 Date of Service: AM Medical Record [id number] Number: Patient Account Number: [id number] Date of Birth/Sex: [birth year] (66 y.o. Male) Treating RN: [name], Other Clinician: Primary Care Camri Molloy: [name] Referring Jashay Roddy: [name] Finesse Fielder/Extender: [name] in Treatment: 0 Wound Status Wound Number: 4 Primary Arterial Insufficiency Ulcer Etiology: Wound Location: Right Foot - Dorsal Wound Open Wounding Event: Gradually Appeared Status: Date Acquired: 06/11/2016 Comorbid Anemia, Lymphedema, Congestive [name] Of Treatment: 0 History: Heart Failure, Hypertension, Peripheral Clustered Wound: No Venous Disease, Type II Diabetes, Pending Amputation On Presentation Neuropathy Photos Photo Uploaded By: [name] on 09/21/2016  14:37:05 Wound Measurements Length: (cm) 2.1 Width: (cm) 2.7 Depth: (cm) 0.1 Area: (cm) 4.453 Volume: (cm) 0.445 % Reduction in Area: % Reduction in Volume: Epithelialization: Small (1-33%) Tunneling: No Undermining: No Wound Description Full Thickness Without Foul Odor After Classification: Exposed Support Structures Due to Product Diabetic Severity Slough/Fibrino Grade 1 (Wagner): Wound Margin: Flat and Intact Exudate Amount: Large Exudate Type: Serous Exudate Color: amber Gajda, Magnum E. ([id number]) Cleansing: Yes Use: No Yes Wound Bed Granulation Amount: Medium (34-66%) Exposed Structure Granulation Quality: Pink Fascia Exposed: No Necrotic Amount: Medium (34-66%) Fat Layer (Subcutaneous Tissue) Exposed: No Necrotic Quality: Adherent Slough Tendon Exposed: No Muscle Exposed: No Joint Exposed: No Bone Exposed: No Periwound Skin Texture Texture Color No Abnormalities Noted: No No Abnormalities Noted: No Callus: No Atrophie Blanche: No Crepitus: No Cyanosis: No Excoriation: Yes Ecchymosis: No Induration: No Erythema: No Rash: No Hemosiderin Staining: No Scarring: No Mottled: No Pallor: No Moisture Rubor: No No Abnormalities Noted: No Dry / Scaly: No Temperature / Pain Maceration: Yes Temperature: No Abnormality Wound Preparation Ulcer Cleansing: Rinsed/Irrigated with Saline Topical Anesthetic Applied: Other: lidocaine 4%, Electronic Signature(s) Signed: 09/21/2016 4:38:01 PM By: [name] Entered By: [name] on 09/21/2016 08:57:02 Mcmichael, [name] ([id number]) -------------------------------------------------------------------------------- Wound Assessment Details Patient Name: BODI, PALMERI 09/21/2016 8:00 Date of Service: AM Medical Record [id number] Number: Patient Account Number: [id number] Date of Birth/Sex: 01-17-1951 (66 y.o. Male) Treating RN: [name], Other Clinician: Primary Care  Mikela Senn: [name] Referring Aydn Ferrara: [name] Brendalyn Vallely/Extender: [name] in Treatment: 0 Wound Status Wound Number: 5 Primary Arterial Insufficiency Ulcer Etiology: Wound Location: Left Toe Great Wound Open Wounding Event: Gradually Appeared Status: Date Acquired: 06/11/2016 Comorbid Anemia, Lymphedema, Congestive [name] Of Treatment: 0 History: Heart Failure, Hypertension, Peripheral Clustered Wound: No Venous Disease, Type II Diabetes, Pending Amputation On Presentation Neuropathy Photos Photo Uploaded By: [name] on 09/21/2016 14:38:00 Wound Measurements Length: (cm) 3.5 Width: (cm) 5.2 Depth: (cm) 0.1 Area: (cm) 14.294 Volume: (cm) 1.429 % Reduction in Area: % Reduction in Volume: Epithelialization: Small (1-33%) Tunneling: No Undermining: No Wound Description Full Thickness Without Foul Odor After Classification: Exposed Support Structures Due to Product Diabetic Severity Slough/Fibrino Grade 1 (Wagner): Wound Margin: Flat and Intact Exudate Amount: Large Exudate  Type: Serous Exudate Color: amber Priego, Ediberto E. ([id number]) Cleansing: Yes Use: No Yes Wound Bed Granulation Amount: Medium (34-66%) Exposed Structure Granulation Quality: Pink Fascia Exposed: No Necrotic Amount: Medium (34-66%) Fat Layer (Subcutaneous Tissue) Exposed: No Necrotic Quality: Adherent Slough Tendon Exposed: No Muscle Exposed: No Joint Exposed: No Bone Exposed: No Periwound Skin Texture Texture Color No Abnormalities Noted: No No Abnormalities Noted: No Callus: No Atrophie Blanche: No Crepitus: No Cyanosis: No Excoriation: Yes Ecchymosis: No Induration: No Erythema: No Rash: No Hemosiderin Staining: No Scarring: No Mottled: No Pallor: No Moisture Rubor: No No Abnormalities Noted: No Dry / Scaly: No Temperature / Pain Maceration: Yes Temperature: No Abnormality Wound Preparation Ulcer Cleansing: Rinsed/Irrigated with  Saline Topical Anesthetic Applied: Other: lidocaine 4%, Electronic Signature(s) Signed: 09/21/2016 4:38:01 PM By: [name] Entered By: [name] on 09/21/2016 08:58:08 Gaspar, [name] ([id number]) -------------------------------------------------------------------------------- Wound Assessment Details Patient Name: OLIE, DIBERT 09/21/2016 8:00 Date of Service: AM Medical Record [id number] Number: Patient Account Number: [id number] Date of Birth/Sex: 1950/11/29 (66 y.o. Male) Treating RN: [name], Other Clinician: Primary Care Lisel Siegrist: [name] Referring Kristinia Leavy: [name] Sephira Zellman/Extender: [name] in Treatment: 0 Wound Status Wound Number: 6 Primary Arterial Insufficiency Ulcer Etiology: Wound Location: Left Toe Second Wound Open Wounding Event: Gradually Appeared Status: Date Acquired: 06/11/2016 Comorbid Anemia, Lymphedema, Congestive [name] Of Treatment: 0 History: Heart Failure, Hypertension, Peripheral Clustered Wound: No Venous Disease, Type II Diabetes, Pending Amputation On Presentation Neuropathy Photos Photo Uploaded By: [name] on 09/21/2016 14:38:19 Wound Measurements Length: (cm) 1.6 Width: (cm) 1.7 Depth: (cm) 0.1 Area: (cm) 2.136 Volume: (cm) 0.214 % Reduction in Area: % Reduction in Volume: Epithelialization: Small (1-33%) Tunneling: No Undermining: No Wound Description Full Thickness Without Foul Odor After Classification: Exposed Support Structures Due to Product Diabetic Severity Slough/Fibrino Grade 1 (Wagner): Wound Margin: Flat and Intact Exudate Amount: Large Exudate Type: Serous Exudate Color: amber Ripberger, Toshiyuki E. ([id number]) Cleansing: Yes Use: No Yes Wound Bed Granulation Amount: Medium (34-66%) Exposed Structure Granulation Quality: Pink Fascia Exposed: No Necrotic Amount: Medium (34-66%) Fat Layer (Subcutaneous Tissue) Exposed: No Necrotic Quality:  Adherent Slough Tendon Exposed: No Muscle Exposed: No Joint Exposed: No Bone Exposed: No Periwound Skin Texture Texture Color No Abnormalities Noted: No No Abnormalities Noted: No Callus: No Atrophie Blanche: No Crepitus: No Cyanosis: No Excoriation: Yes Ecchymosis: No Induration: No Erythema: No Rash: No Hemosiderin Staining: No Scarring: No Mottled: No Pallor: No Moisture Rubor: No No Abnormalities Noted: No Dry / Scaly: No Temperature / Pain Maceration: Yes Temperature: No Abnormality Wound Preparation Ulcer Cleansing: Rinsed/Irrigated with Saline Topical Anesthetic Applied: Other: lidocaine 4%, Electronic Signature(s) Signed: 09/21/2016 4:38:01 PM By: [name] Entered By: [name] on 09/21/2016 08:59:17 Hupfer, [name] ([id number]) -------------------------------------------------------------------------------- Vitals Details Patient Name: TRAMAINE, SAULS 09/21/2016 8:00 Date of Service: AM Medical Record [id number] Number: Patient Account Number: [id number] Date of Birth/Sex: 02-10-1951 (66 y.o. Male) Treating RN: [name], Other Clinician: Primary Care Kytzia Gienger: Aundria Rud, Errol Referring Rowen Hur: [name] Zayd Bonet/Extender: [name] in Treatment: 0 Vital Signs Time Taken: 08:24 Temperature (F): 98.2 Height (in): 69 Pulse (bpm): 72 Source: Measured Respiratory Rate (breaths/min): 18 Weight (lbs): 168 Blood Pressure (mmHg): 135/79 Source: Measured Reference Range: 80 - 120 mg / dl Body Mass Index (BMI): 24.8 Electronic Signature(s) Signed: 09/21/2016 4:38:01 PM By: [name] Entered By: [name] on 09/21/2016 60:63:01

## 2016-09-25 ENCOUNTER — Telehealth: Payer: Self-pay

## 2016-09-25 NOTE — Telephone Encounter (Signed)
Attempted to LVM for Mr. [name] (Mr. [name] guardian) about Paracentesis following Wound care appointment.  Contacted Mr. [name] directly and advised of paracentesis appt. He stated that he will call Mr. [name]

## 2016-09-26 ENCOUNTER — Inpatient Hospital Stay: Payer: Medicare HMO

## 2016-09-26 VITALS — BP 143/75 | HR 66 | Resp 20

## 2016-09-26 DIAGNOSIS — D509 Iron deficiency anemia, unspecified: Secondary | ICD-10-CM | POA: Diagnosis not present

## 2016-09-26 MED ORDER — SODIUM CHLORIDE 0.9 % IV SOLN
510.0000 mg | Freq: Once | INTRAVENOUS | Status: AC
  Administered 2016-09-26: 510 mg via INTRAVENOUS
  Filled 2016-09-26: qty 17

## 2016-09-26 MED ORDER — SODIUM CHLORIDE 0.9 % IV SOLN
Freq: Once | INTRAVENOUS | Status: AC
  Administered 2016-09-26: 14:00:00 via INTRAVENOUS
  Filled 2016-09-26: qty 1000

## 2016-10-01 ENCOUNTER — Telehealth: Payer: Self-pay | Admitting: Gastroenterology

## 2016-10-01 ENCOUNTER — Other Ambulatory Visit: Payer: Self-pay | Admitting: Gastroenterology

## 2016-10-01 ENCOUNTER — Ambulatory Visit
Admission: RE | Admit: 2016-10-01 | Discharge: 2016-10-01 | Disposition: A | Discharge status: Home / Self Care | Payer: Medicare HMO | Source: Ambulatory Visit | Attending: Gastroenterology | Admitting: Gastroenterology

## 2016-10-01 ENCOUNTER — Encounter: Payer: Medicare HMO | Admitting: Physician Assistant

## 2016-10-01 DIAGNOSIS — K7031 Alcoholic cirrhosis of liver with ascites: Secondary | ICD-10-CM | POA: Diagnosis present

## 2016-10-01 DIAGNOSIS — E11621 Type 2 diabetes mellitus with foot ulcer: Secondary | ICD-10-CM | POA: Diagnosis not present

## 2016-10-01 NOTE — Telephone Encounter (Signed)
Please call Lauren in Ultrasound at [facility]. Lenon did not have enough fluid for parasenthesis today. Should she r/s for two weeks or just when patient is symptomatic?  Please call her back  # [phone]

## 2016-10-02 NOTE — Progress Notes (Signed)
[name] ([id number]) Visit Report for 10/01/2016 Arrival Information Details Patient Name: [name], [name] 10/01/2016 9:15 Date of Service: AM Medical Record [id number] Number: Patient Account Number: [id number] Date of Birth/Sex: 1950/07/11 (66 y.o. Male) Treating RN: Elyn Peers, Other Clinician: Primary Care Yaira Bernardi: Olivia Mackie Treating STONE III, Charlynn Grimes, Jodean Valade/Extender: Referring Hero Mccathern: Harless Nakayama in Treatment: 1 Visit Information History Since Last Visit Added or deleted any medications: No Patient Arrived: Cane Any new allergies or adverse reactions: No Arrival Time: 09:29 Had a fall or experienced change in No Accompanied By: son activities of daily living that may affect Transfer Assistance: None risk of falls: Patient Identification Verified: Yes Signs or symptoms of abuse/neglect since last No Secondary Verification Process Completed: Yes visito Patient Has Alerts: Yes Hospitalized since last visit: No Patient Alerts: DMII Has Dressing in Place as Prescribed: Yes Pain Present Now: No Electronic Signature(s) Signed: 10/01/2016 5:16:03 PM By: Montey Hora Entered By: Montey Hora on 10/01/2016 09:29:58 Dible, [name] ([id number]) -------------------------------------------------------------------------------- Clinic Level of Care Assessment Details Patient Name: [name] 10/01/2016 9:15 Date of Service: AM Medical Record [id number] Number: Patient Account Number: [id number] Date of Birth/Sex: 1950-06-20 (66 y.o. Male) Treating RN: Elyn Peers, Other Clinician: Primary Care Eileen Kangas: Olivia Mackie Treating STONE III, HOYT MCLEAN-SCOCOZZA, Maicol Bowland/Extender: Referring Gissele Narducci: Harless Nakayama in Treatment: 1 Clinic Level of Care Assessment Items TOOL 4 Quantity Score []  - Use when only an EandM is performed on FOLLOW-UP visit 0 ASSESSMENTS - Nursing Assessment / Reassessment X - Reassessment of  Co-morbidities (includes updates in patient status) 1 10 X - Reassessment of Adherence to Treatment Plan 1 5 ASSESSMENTS - Wound and Skin Assessment / Reassessment []  - Simple Wound Assessment / Reassessment - one wound 0 X - Complex Wound Assessment / Reassessment - multiple wounds 6 5 []  - Dermatologic / Skin Assessment (not related to wound area) 0 ASSESSMENTS - Focused Assessment []  - Circumferential Edema Measurements - multi extremities 0 []  - Nutritional Assessment / Counseling / Intervention 0 []  - Lower Extremity Assessment (monofilament, tuning fork, pulses) 0 []  - Peripheral Arterial Disease Assessment (using hand held doppler) 0 ASSESSMENTS - Ostomy and/or Continence Assessment and Care []  - Incontinence Assessment and Management 0 []  - Ostomy Care Assessment and Management (repouching, etc.) 0 PROCESS - Coordination of Care X - Simple Patient / Family Education for ongoing care 1 15 []  - Complex (extensive) Patient / Family Education for ongoing care 0 []  - Staff obtains Consents, Records, Test Results / Process Orders 0 Pavlovich, Kiril E. ([id number]) []  - Staff telephones HHA, Nursing Homes / Clarify orders / etc 0 []  - Routine Transfer to another Facility (non-emergent condition) 0 []  - Routine Hospital Admission (non-emergent condition) 0 []  - New Admissions / Biomedical engineer / Ordering NPWT, Apligraf, etc. 0 []  - Emergency Hospital Admission (emergent condition) 0 X - Simple Discharge Coordination 1 10 []  - Complex (extensive) Discharge Coordination 0 PROCESS - Special Needs []  - Pediatric / Minor Patient Management 0 []  - Isolation Patient Management 0 []  - Hearing / Language / Visual special needs 0 []  - Assessment of Community assistance (transportation, D/C planning, etc.) 0 []  - Additional assistance / Altered mentation 0 []  - Support Surface(s) Assessment (bed, cushion, seat, etc.) 0 INTERVENTIONS - Wound Cleansing / Measurement []  - Simple Wound  Cleansing - one wound 0 X - Complex Wound Cleansing - multiple wounds 6 5 X - Wound Imaging (photographs - any number of wounds) 1 5 []  - Wound  Tracing (instead of photographs) 0 []  - Simple Wound Measurement - one wound 0 X - Complex Wound Measurement - multiple wounds 6 5 INTERVENTIONS - Wound Dressings X - Small Wound Dressing one or multiple wounds 6 10 []  - Medium Wound Dressing one or multiple wounds 0 []  - Large Wound Dressing one or multiple wounds 0 []  - Application of Medications - topical 0 []  - Application of Medications - injection 0 Mccraw, Cyruss E. ([id number]) INTERVENTIONS - Miscellaneous []  - External ear exam 0 []  - Specimen Collection (cultures, biopsies, blood, body fluids, etc.) 0 []  - Specimen(s) / Culture(s) sent or taken to Lab for analysis 0 []  - Patient Transfer (multiple staff / Harrel Lemon Lift / Similar devices) 0 []  - Simple Staple / Suture removal (25 or less) 0 []  - Complex Staple / Suture removal (26 or more) 0 []  - Hypo / Hyperglycemic Management (close monitor of Blood Glucose) 0 []  - Ankle / Brachial Index (ABI) - do not check if billed separately 0 X - Vital Signs 1 5 Has the patient been seen at the hospital within the last three years: Yes Total Score: 200 Level Of Care: New/Established - Level 5 Electronic Signature(s) Signed: 10/01/2016 5:16:03 PM By: Montey Hora Entered By: Montey Hora on 10/01/2016 10:20:45 Pennock, [name] ([id number]) -------------------------------------------------------------------------------- Encounter Discharge Information Details Patient Name: [name] 10/01/2016 9:15 Date of Service: AM Medical Record [id number] Number: Patient Account Number: [id number] Date of Birth/Sex: 04-May-1950 (66 y.o. Male) Treating RN: Elyn Peers, Other Clinician: Primary Care Daneisha Surges: TRACY Treating STONE III, Charlynn Grimes, Mahogony Gilchrest/Extender: Referring Jessah Danser: Harless Nakayama in Treatment:  1 Encounter Discharge Information Items Discharge Pain Level: 0 Discharge Condition: Stable Ambulatory Status: Ambulatory Discharge Destination: Home Transportation: Private Auto Accompanied By: son Schedule Follow-up Appointment: Yes Medication Reconciliation completed and provided to Patient/Care No Dwayne Begay: Provided on Clinical Summary of Care: 10/01/2016 Form Type Recipient Paper Patient ED Electronic Signature(s) Signed: 10/01/2016 10:21:48 AM By: Montey Hora Previous Signature: 10/01/2016 10:06:40 AM Version By: Ruthine Dose Entered By: Montey Hora on 10/01/2016 10:21:48 Esteve, [name] ([id number]) -------------------------------------------------------------------------------- Lower Extremity Assessment Details Patient Name: KUNIO, CUMMISKEY 10/01/2016 9:15 Date of Service: AM Medical Record [id number] Number: Patient Account Number: [id number] Date of Birth/Sex: 04-23-1950 (66 y.o. Male) Treating RN: Elyn Peers, Other Clinician: Primary Care Jorryn Casagrande: Olivia Mackie Treating STONE III, Charlynn Grimes, Mystic Labo/Extender: Referring Feleica Fulmore: Harless Nakayama in Treatment: 1 Vascular Assessment Pulses: Dorsalis Pedis Palpable: [Left:Yes] [Right:Yes] Posterior Tibial Extremity colors, hair growth, and conditions: Extremity Color: [Left:Hyperpigmented] [Right:Hyperpigmented] Hair Growth on Extremity: [Left:No] [Right:No] Temperature of Extremity: [Left:Warm] [Right:Warm] Capillary Refill: [Left:< 3 seconds] [Right:< 3 seconds] Electronic Signature(s) Signed: 10/01/2016 9:42:42 AM By: Montey Hora Entered By: Montey Hora on 10/01/2016 09:42:41 Balthazor, [name] ([id number]) -------------------------------------------------------------------------------- Multi Wound Chart Details Patient Name: [name] 10/01/2016 9:15 Date of Service: AM Medical Record [id number] Number: Patient Account Number: [id number] Date of Birth/Sex: 1951/03/22  (66 y.o. Male) Treating RN: Elyn Peers, Other Clinician: Primary Care Erienne Spelman: TRACY Treating STONE III, HOYT MCLEAN-SCOCOZZA, Mattingly Fountaine/Extender: Referring Jontavia Leatherbury: Harless Nakayama in Treatment: 1 Vital Signs Height(in): 69 Pulse(bpm): 74 Weight(lbs): 168 Blood Pressure 138/61 (mmHg): Body Mass Index(BMI): 25 Temperature(F): 98.3 Respiratory Rate 18 (breaths/min): Photos: [1:No Photos] [2:No Photos] [3:No Photos] Wound Location: [1:Right Toe Great] [2:Right Toe Second] [3:Right Toe Third] Wounding Event: [1:Gradually Appeared] [2:Gradually Appeared] [3:Gradually Appeared] Primary Etiology: [1:Diabetic Wound/Ulcer of the Lower Extremity] [2:Diabetic Wound/Ulcer of the Lower Extremity] [3:Diabetic Wound/Ulcer of the Lower Extremity] Comorbid History: [1:Anemia, Lymphedema,  Congestive Heart Failure, Hypertension, Peripheral Venous Disease, Type II Diabetes, Neuropathy] [2:Anemia, Lymphedema, Congestive Heart Failure, Hypertension, Peripheral Venous Disease, Type II Diabetes,  Neuropathy] [3:Anemia, Lymphedema, Congestive Heart Failure, Hypertension, Peripheral Venous Disease, Type II Diabetes, Neuropathy] Date Acquired: [1:06/11/2016] [2:06/11/2016] [3:06/11/2016] Weeks of Treatment: [1:1] [2:1] [3:1] Wound Status: [1:Open] [2:Open] [3:Open] Pending Amputation on Yes [2:Yes] [3:Yes] Presentation: Measurements L x W x D 7.6x6.9x0.1 [2:1.3x2.1x0.1] [3:0.8x0.9x0.1] (cm) Area (cm) : [1:41.186] [2:2.144] [3:0.565] Volume (cm) : [1:4.119] [2:0.214] [3:0.057] % Reduction in Area: [1:12.60%] [2:30.20%] [3:-12.30%] % Reduction in Volume: 12.60% [2:30.30%] [3:-14.00%] Classification: [1:Grade 1] [2:Grade 1] [3:Grade 1] Exudate Amount: [1:Large] [2:Large] [3:Large] Exudate Type: [1:Serous] [2:Serous] [3:Serous] Exudate Color: [1:amber] [2:amber] [3:amber] Foul Odor After [1:Yes] [2:Yes] [3:Yes] Cleansing: Tobon, Askari E. ([id number]) Odor Anticipated Due to No No  No Product Use: Wound Margin: Flat and Intact Flat and Intact Flat and Intact Granulation Amount: Large (67-100%) Large (67-100%) Large (67-100%) Granulation Quality: Pink Pink Pink Necrotic Amount: Small (1-33%) Small (1-33%) Small (1-33%) Exposed Structures: Fascia: No Fascia: No Fascia: No Fat Layer (Subcutaneous Fat Layer (Subcutaneous Fat Layer (Subcutaneous Tissue) Exposed: No Tissue) Exposed: No Tissue) Exposed: No Tendon: No Tendon: No Tendon: No Muscle: No Muscle: No Muscle: No Joint: No Joint: No Joint: No Bone: No Bone: No Bone: No Epithelialization: Small (1-33%) Small (1-33%) Small (1-33%) Periwound Skin Texture: Excoriation: Yes Excoriation: Yes Excoriation: Yes Induration: No Induration: No Induration: No Callus: No Callus: No Callus: No Crepitus: No Crepitus: No Crepitus: No Rash: No Rash: No Rash: No Scarring: No Scarring: No Scarring: No Periwound Skin Maceration: Yes Maceration: Yes Maceration: Yes Moisture: Dry/Scaly: No Dry/Scaly: No Dry/Scaly: No Periwound Skin Color: Atrophie Blanche: No Atrophie Blanche: No Atrophie Blanche: No Cyanosis: No Cyanosis: No Cyanosis: No Ecchymosis: No Ecchymosis: No Ecchymosis: No Erythema: No Erythema: No Erythema: No Hemosiderin Staining: No Hemosiderin Staining: No Hemosiderin Staining: No Mottled: No Mottled: No Mottled: No Pallor: No Pallor: No Pallor: No Rubor: No Rubor: No Rubor: No Temperature: No Abnormality No Abnormality No Abnormality Tenderness on No No No Palpation: Wound Preparation: Ulcer Cleansing: Ulcer Cleansing: Ulcer Cleansing: Rinsed/Irrigated with Rinsed/Irrigated with Rinsed/Irrigated with Saline Saline Saline Topical Anesthetic Topical Anesthetic Topical Anesthetic Applied: Other: lidocaine Applied: Other: lidocaine Applied: Other: lidocaine 4% 4% 4% Wound Number: 4 [phone]  Photos: No Photos No Photos No Photos Wound Location: Right Foot - Dorsal Left Toe  Great Left Toe Second Wounding Event: Gradually Appeared Gradually Appeared Gradually Appeared Primary 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 the Lower Extremity the Lower Extremity Comorbid History: Anemia, Lymphedema, Anemia, Lymphedema, Anemia, Lymphedema, Congestive Heart Failure, Congestive Heart Failure, Congestive Heart Failure, Hypertension, Peripheral Hypertension, Peripheral Hypertension, Peripheral Vana, Usbaldo E. ([id number]) Venous Disease, Type II Venous Disease, Type II Venous Disease, Type II Diabetes, Neuropathy Diabetes, Neuropathy Diabetes, Neuropathy Date Acquired: 06/11/2016 06/11/2016 06/11/2016 Weeks of Treatment: 1 1 1  Wound Status: Open Open Open Pending Amputation on Yes Yes Yes Presentation: Measurements L x W x D 0.5x0.7x0.1 2x3.6x0.1 1x0.7x0.1 (cm) Area (cm) : 0.275 5.655 0.55 Volume (cm) : 0.027 0.565 0.055 % Reduction in Area: 93.80% 60.40% 74.30% % Reduction in Volume: 93.90% 60.50% 74.30% Classification: Grade 1 Grade 1 Grade 1 Exudate Amount: Large Large Large Exudate Type: Serous Serous Serous Exudate Color: amber amber amber Foul Odor After Yes Yes Yes Cleansing: Odor Anticipated Due to No No No Product Use: Wound Margin: Flat and Intact Flat and Intact Flat and Intact Granulation Amount: Large (67-100%) Large (67-100%)  Large (67-100%) Granulation Quality: Pink Pink Pink Necrotic Amount: Small (1-33%) Small (1-33%) Small (1-33%) Exposed Structures: Fascia: No Fascia: No Fascia: No Fat Layer (Subcutaneous Fat Layer (Subcutaneous Fat Layer (Subcutaneous Tissue) Exposed: No Tissue) Exposed: No Tissue) Exposed: No Tendon: No Tendon: No Tendon: No Muscle: No Muscle: No Muscle: No Joint: No Joint: No Joint: No Bone: No Bone: No Bone: No Epithelialization: Small (1-33%) Small (1-33%) Small (1-33%) Periwound Skin Texture: Excoriation: Yes Excoriation: Yes Excoriation:  Yes Induration: No Induration: No Induration: No Callus: No Callus: No Callus: No Crepitus: No Crepitus: No Crepitus: No Rash: No Rash: No Rash: No Scarring: No Scarring: No Scarring: No Periwound Skin Maceration: Yes Maceration: Yes Maceration: Yes Moisture: Dry/Scaly: No Dry/Scaly: No Dry/Scaly: No Periwound Skin Color: Atrophie Blanche: No Atrophie Blanche: No Atrophie Blanche: No Cyanosis: No Cyanosis: No Cyanosis: No Ecchymosis: No Ecchymosis: No Ecchymosis: No Erythema: No Erythema: No Erythema: No Hemosiderin Staining: No Hemosiderin Staining: No Hemosiderin Staining: No Mottled: No Mottled: No Mottled: No Pallor: No Pallor: No Pallor: No Rubor: No Rubor: No Rubor: No Temperature: No Abnormality No Abnormality No Abnormality Pietsch, Tamario E. ([id number]) Tenderness on No No No Palpation: Wound Preparation: Ulcer Cleansing: Ulcer Cleansing: Ulcer Cleansing: Rinsed/Irrigated with Rinsed/Irrigated with Rinsed/Irrigated with Saline Saline Saline Topical Anesthetic Topical Anesthetic Topical Anesthetic Applied: Other: lidocaine Applied: Other: lidocaine Applied: Other: lidocaine 4% 4% 4% Treatment Notes Electronic Signature(s) Signed: 10/01/2016 9:43:14 AM By: Montey Hora Entered By: Montey Hora on 10/01/2016 09:43:14 Bodin, [name] ([id number]) -------------------------------------------------------------------------------- Multi-Disciplinary Care Plan Details Patient Name: GAELEN, BRAGER 10/01/2016 9:15 Date of Service: AM Medical Record [id number] Number: Patient Account Number: [id number] Date of Birth/Sex: December 18, 1950 (66 y.o. Male) Treating RN: Elyn Peers, Other Clinician: Primary Care Truong Delcastillo: TRACY Treating STONE III, Charlynn Grimes, Catelin Manthe/Extender: Referring Izel Hochberg: Harless Nakayama in Treatment: 1 Active Inactive ` Abuse / Safety / Falls / Self Care Management Nursing Diagnoses: Potential for  falls Goals: Patient will remain injury free related to falls Date Initiated: 09/21/2016 Target Resolution Date: 11/30/2016 Goal Status: Active Interventions: Assess fall risk on admission and as needed Notes: ` Nutrition Nursing Diagnoses: Potential for alteratiion in Nutrition/Potential for imbalanced nutrition Goals: Patient/caregiver agrees to and verbalizes understanding of need to use nutritional supplements and/or vitamins as prescribed Date Initiated: 09/21/2016 Target Resolution Date: 11/30/2016 Goal Status: Active Interventions: Assess patient nutrition upon admission and as needed per policy Notes: ` Orientation to the [facility]. ([id number]) Nursing Diagnoses: Knowledge deficit related to the wound healing center program Goals: Patient/caregiver will verbalize understanding of the [facility] Date Initiated: 09/21/2016 Target Resolution Date: 11/30/2016 Goal Status: Active Interventions: Provide education on orientation to the wound center Notes: ` Wound/Skin Impairment Nursing Diagnoses: Knowledge deficit related to smoking impact on wound healing Goals: Ulcer/skin breakdown will have a volume reduction of 30% by week 4 Date Initiated: 09/21/2016 Target Resolution Date: 11/30/2016 Goal Status: Active Ulcer/skin breakdown will have a volume reduction of 50% by week 8 Date Initiated: 09/21/2016 Target Resolution Date: 11/30/2016 Goal Status: Active Ulcer/skin breakdown will have a volume reduction of 80% by week 12 Date Initiated: 09/21/2016 Target Resolution Date: 11/30/2016 Goal Status: Active Ulcer/skin breakdown will heal within 14 weeks Date Initiated: 09/21/2016 Target Resolution Date: 11/30/2016 Goal Status: Active Interventions: Assess patient/caregiver ability to obtain necessary supplies Assess patient/caregiver ability to perform ulcer/skin care regimen upon admission and as needed Assess ulceration(s) every  visit Notes: Electronic Signature(s) Signed: 10/01/2016 9:42:50 AM By: Montey Hora Entered By:  Montey Hora on 10/01/2016 09:42:49 Rump, ZIAIRE HAGOS ([id number]) -------------------------------------------------------------------------------- Pain Assessment Details Patient Name: BRYDEN, DARDEN 10/01/2016 9:15 Date of Service: AM Medical Record [id number] Number: Patient Account Number: [id number] Date of Birth/Sex: 03-27-1951 (66 y.o. Male) Treating RN: Elyn Peers, Other Clinician: Primary Care Rosene Pilling: Olivia Mackie Treating STONE III, HOYT MCLEAN-SCOCOZZA, Janessa Mickle/Extender: Referring Alvino Lechuga: Harless Nakayama in Treatment: 1 Active Problems Location of Pain Severity and Description of Pain Patient Has Paino No Site Locations Pain Management and Medication Current Pain Management: Notes Topical or injectable lidocaine is offered to patient for acute pain when surgical debridement is performed. If needed, Patient is instructed to use over the counter pain medication for the following 24-48 hours after debridement. Wound care MDs do not prescribed pain medications. Patient has chronic pain or uncontrolled pain. Patient has been instructed to make an appointment with their Primary Care Physician for pain management. Electronic Signature(s) Signed: 10/01/2016 5:16:03 PM By: Montey Hora Entered By: Montey Hora on 10/01/2016 09:30:06 Prestage, [name] ([id number]) -------------------------------------------------------------------------------- Patient/Caregiver Education Details Patient Name: WENDEL, HOMEYER 10/01/2016 9:15 Date of Service: AM Medical Record [id number] Number: Patient Account Number: [id number] Date of Birth/Gender: July 25, 1950 (66 y.o. Male) Treating RN: Elyn Peers, Other Clinician: Primary Care Physician: Olivia Mackie Treating STONE III, Charlynn Grimes, Physician/Extender: Referring Physician: Harless Nakayama in Treatment:  1 Education Assessment Education Provided To: Patient and Caregiver Education Topics Provided Wound/Skin Impairment: Handouts: Other: wound care as ordered Methods: Demonstration, Explain/Verbal Responses: State content correctly Electronic Signature(s) Signed: 10/01/2016 5:16:03 PM By: Montey Hora Entered By: Montey Hora on 10/01/2016 10:22:09 Wenzler, [name] ([id number]) -------------------------------------------------------------------------------- Wound Assessment Details Patient Name: [name]. 10/01/2016 9:15 Date of Service: AM Medical Record [id number] Number: Patient Account Number: [id number] Date of Birth/Sex: 29-Dec-1950 (66 y.o. Male) Treating RN: Elyn Peers, Other Clinician: Primary Care Garyn Waguespack: TRACY Treating STONE III, HOYT MCLEAN-SCOCOZZA, Rosmery Duggin/Extender: Referring Leshea Jaggers: Harless Nakayama in Treatment: 1 Wound Status Wound Number: 1 Primary Diabetic Wound/Ulcer of the Lower Etiology: Extremity Wound Location: Right Toe Great Wound Open Wounding Event: Gradually Appeared Status: Date Acquired: 06/11/2016 Comorbid Anemia, Lymphedema, Congestive Weeks Of Treatment: 1 History: Heart Failure, Hypertension, Peripheral Clustered Wound: No Venous Disease, Type II Diabetes, Pending Amputation On Presentation Neuropathy Photos Photo Uploaded By: Montey Hora on 10/01/2016 11:27:21 Wound Measurements Length: (cm) 7.6 Width: (cm) 6.9 Depth: (cm) 0.1 Area: (cm) 41.186 Volume: (cm) 4.119 % Reduction in Area: 12.6% % Reduction in Volume: 12.6% Epithelialization: Small (1-33%) Tunneling: No Undermining: No Wound Description Classification: Grade 1 Foul Odor After Wound Margin: Flat and Intact Due to Product Exudate Amount: Large Slough/Fibrino Exudate Type: Serous Exudate Color: amber Cleansing: Yes Use: No Yes Wound Bed Lile, Wenceslao E. ([id number]) Granulation Amount: Large (67-100%) Exposed  Structure Granulation Quality: Pink Fascia Exposed: No Necrotic Amount: Small (1-33%) Fat Layer (Subcutaneous Tissue) Exposed: No Necrotic Quality: Adherent Slough Tendon Exposed: No Muscle Exposed: No Joint Exposed: No Bone Exposed: No Periwound Skin Texture Texture Color No Abnormalities Noted: No No Abnormalities Noted: No Callus: No Atrophie Blanche: No Crepitus: No Cyanosis: No Excoriation: Yes Ecchymosis: No Induration: No Erythema: No Rash: No Hemosiderin Staining: No Scarring: No Mottled: No Pallor: No Moisture Rubor: No No Abnormalities Noted: No Dry / Scaly: No Temperature / Pain Maceration: Yes Temperature: No Abnormality Wound Preparation Ulcer Cleansing: Rinsed/Irrigated with Saline Topical Anesthetic Applied: Other: lidocaine 4%, Treatment Notes Wound #1 (Right Toe Great) 1. Cleansed with: Clean wound with Normal Saline 4. Dressing Applied: Aquacel Ag 5. Secondary Dressing Applied Gauze and  Kerlix/Conform 7. Secured with Tape Notes netting Electronic Signature(s) Signed: 10/01/2016 9:39:18 AM By: Montey Hora Entered By: Montey Hora on 10/01/2016 09:39:18 Heinlen, [name] ([id number]) -------------------------------------------------------------------------------- Wound Assessment Details Patient Name: ALEJO, BEAMER 10/01/2016 9:15 Date of Service: AM Medical Record [id number] Number: Patient Account Number: [id number] Date of Birth/Sex: 28-Oct-1950 (66 y.o. Male) Treating RN: Elyn Peers, Other Clinician: Primary Care Erving Sassano: TRACY Treating STONE III, HOYT MCLEAN-SCOCOZZA, Ivanna [name]/Extender: Referring Alexes Menchaca: Harless Nakayama in Treatment: 1 Wound Status Wound Number: 2 Primary Diabetic Wound/Ulcer of the Lower Etiology: Extremity Wound Location: Right Toe Second Wound Open Wounding Event: Gradually Appeared Status: Date Acquired: 06/11/2016 Comorbid Anemia, Lymphedema, Congestive Weeks Of Treatment:  1 History: Heart Failure, Hypertension, Peripheral Clustered Wound: No Venous Disease, Type II Diabetes, Pending Amputation On Presentation Neuropathy Photos Photo Uploaded By: Montey Hora on 10/01/2016 11:27:57 Wound Measurements Length: (cm) 1.3 Width: (cm) 2.1 Depth: (cm) 0.1 Area: (cm) 2.144 Volume: (cm) 0.214 % Reduction in Area: 30.2% % Reduction in Volume: 30.3% Epithelialization: Small (1-33%) Tunneling: No Undermining: No Wound Description Classification: Grade 1 Foul Odor After Wound Margin: Flat and Intact Due to Product Exudate Amount: Large Slough/Fibrino Exudate Type: Serous Exudate Color: amber Cleansing: Yes Use: No Yes Wound Bed Ollinger, Tyrek E. ([id number]) Granulation Amount: Large (67-100%) Exposed Structure Granulation Quality: Pink Fascia Exposed: No Necrotic Amount: Small (1-33%) Fat Layer (Subcutaneous Tissue) Exposed: No Necrotic Quality: Adherent Slough Tendon Exposed: No Muscle Exposed: No Joint Exposed: No Bone Exposed: No Periwound Skin Texture Texture Color No Abnormalities Noted: No No Abnormalities Noted: No Callus: No Atrophie Blanche: No Crepitus: No Cyanosis: No Excoriation: Yes Ecchymosis: No Induration: No Erythema: No Rash: No Hemosiderin Staining: No Scarring: No Mottled: No Pallor: No Moisture Rubor: No No Abnormalities Noted: No Dry / Scaly: No Temperature / Pain Maceration: Yes Temperature: No Abnormality Wound Preparation Ulcer Cleansing: Rinsed/Irrigated with Saline Topical Anesthetic Applied: Other: lidocaine 4%, Treatment Notes Wound #2 (Right Toe Second) 1. Cleansed with: Clean wound with Normal Saline 4. Dressing Applied: Aquacel Ag 5. Secondary Dressing Applied Gauze and Kerlix/Conform 7. Secured with Tape Notes netting Electronic Signature(s) Signed: 10/01/2016 9:39:35 AM By: Montey Hora Entered By: Montey Hora on 10/01/2016 09:39:34 Gullion, [name]  ([id number]) -------------------------------------------------------------------------------- Wound Assessment Details Patient Name: TERRIE, GRAJALES 10/01/2016 9:15 Date of Service: AM Medical Record [id number] Number: Patient Account Number: [id number] Date of Birth/Sex: 1950-08-05 (66 y.o. Male) Treating RN: Elyn Peers, Other Clinician: Primary Care Eivin Mascio: TRACY Treating STONE III, HOYT MCLEAN-SCOCOZZA, Velita Quirk/Extender: Referring Dallis Darden: Harless Nakayama in Treatment: 1 Wound Status Wound Number: 3 Primary Diabetic Wound/Ulcer of the Lower Etiology: Extremity Wound Location: Right Toe Third Wound Open Wounding Event: Gradually Appeared Status: Date Acquired: 06/11/2016 Comorbid Anemia, Lymphedema, Congestive Weeks Of Treatment: 1 History: Heart Failure, Hypertension, Peripheral Clustered Wound: No Venous Disease, Type II Diabetes, Pending Amputation On Presentation Neuropathy Photos Photo Uploaded By: Montey Hora on 10/01/2016 11:27:58 Wound Measurements Length: (cm) 0.8 Width: (cm) 0.9 Depth: (cm) 0.1 Area: (cm) 0.565 Volume: (cm) 0.057 % Reduction in Area: -12.3% % Reduction in Volume: -14% Epithelialization: Small (1-33%) Tunneling: No Undermining: No Wound Description Classification: Grade 1 Foul Odor After Wound Margin: Flat and Intact Due to Product Exudate Amount: Large Slough/Fibrino Exudate Type: Serous Exudate Color: amber Cleansing: Yes Use: No Yes Wound Bed Bertoni, Jacques E. ([id number]) Granulation Amount: Large (67-100%) Exposed Structure Granulation Quality: Pink Fascia Exposed: No Necrotic Amount: Small (1-33%) Fat Layer (Subcutaneous Tissue) Exposed: No Necrotic Quality: Adherent Slough Tendon Exposed: No Muscle Exposed:  No Joint Exposed: No Bone Exposed: No Periwound Skin Texture Texture Color No Abnormalities Noted: No No Abnormalities Noted: No Callus: No Atrophie Blanche: No Crepitus: No Cyanosis:  No Excoriation: Yes Ecchymosis: No Induration: No Erythema: No Rash: No Hemosiderin Staining: No Scarring: No Mottled: No Pallor: No Moisture Rubor: No No Abnormalities Noted: No Dry / Scaly: No Temperature / Pain Maceration: Yes Temperature: No Abnormality Wound Preparation Ulcer Cleansing: Rinsed/Irrigated with Saline Topical Anesthetic Applied: Other: lidocaine 4%, Treatment Notes Wound #3 (Right Toe Third) 1. Cleansed with: Clean wound with Normal Saline 4. Dressing Applied: Aquacel Ag 5. Secondary Dressing Applied Gauze and Kerlix/Conform 7. Secured with Tape Notes netting Electronic Signature(s) Signed: 10/01/2016 9:39:51 AM By: Montey Hora Entered By: Montey Hora on 10/01/2016 09:39:50 Mcinerney, [name] ([id number]) -------------------------------------------------------------------------------- Wound Assessment Details Patient Name: DEVANSH, RIESE 10/01/2016 9:15 Date of Service: AM Medical Record [id number] Number: Patient Account Number: [id number] Date of Birth/Sex: 27-Jan-1951 (66 y.o. Male) Treating RN: Elyn Peers, Other Clinician: Primary Care Chanon Loney: TRACY Treating STONE III, HOYT MCLEAN-SCOCOZZA, Cayle Cordoba/Extender: Referring Jo Booze: Harless Nakayama in Treatment: 1 Wound Status Wound Number: 4 Primary Diabetic Wound/Ulcer of the Lower Etiology: Extremity Wound Location: Right Foot - Dorsal Wound Open Wounding Event: Gradually Appeared Status: Date Acquired: 06/11/2016 Comorbid Anemia, Lymphedema, Congestive Weeks Of Treatment: 1 History: Heart Failure, Hypertension, Peripheral Clustered Wound: No Venous Disease, Type II Diabetes, Pending Amputation On Presentation Neuropathy Photos Photo Uploaded By: Montey Hora on 10/01/2016 11:28:33 Wound Measurements Length: (cm) 0.5 Width: (cm) 0.7 Depth: (cm) 0.1 Area: (cm) 0.275 Volume: (cm) 0.027 % Reduction in Area: 93.8% % Reduction in Volume:  93.9% Epithelialization: Small (1-33%) Tunneling: No Undermining: No Wound Description Classification: Grade 1 Foul Odor After Wound Margin: Flat and Intact Due to Product Exudate Amount: Large Slough/Fibrino Exudate Type: Serous Exudate Color: amber Cleansing: Yes Use: No Yes Wound Bed Josey, Simuel E. ([id number]) Granulation Amount: Large (67-100%) Exposed Structure Granulation Quality: Pink Fascia Exposed: No Necrotic Amount: Small (1-33%) Fat Layer (Subcutaneous Tissue) Exposed: No Necrotic Quality: Adherent Slough Tendon Exposed: No Muscle Exposed: No Joint Exposed: No Bone Exposed: No Periwound Skin Texture Texture Color No Abnormalities Noted: No No Abnormalities Noted: No Callus: No Atrophie Blanche: No Crepitus: No Cyanosis: No Excoriation: Yes Ecchymosis: No Induration: No Erythema: No Rash: No Hemosiderin Staining: No Scarring: No Mottled: No Pallor: No Moisture Rubor: No No Abnormalities Noted: No Dry / Scaly: No Temperature / Pain Maceration: Yes Temperature: No Abnormality Wound Preparation Ulcer Cleansing: Rinsed/Irrigated with Saline Topical Anesthetic Applied: Other: lidocaine 4%, Treatment Notes Wound #4 (Right, Dorsal Foot) 1. Cleansed with: Clean wound with Normal Saline 4. Dressing Applied: Aquacel Ag 5. Secondary Dressing Applied Gauze and Kerlix/Conform 7. Secured with Tape Notes netting Electronic Signature(s) Signed: 10/01/2016 9:41:38 AM By: Montey Hora Entered By: Montey Hora on 10/01/2016 09:41:37 Caulfield, [name] ([id number]) -------------------------------------------------------------------------------- Wound Assessment Details Patient Name: DEMERE, DOTZLER 10/01/2016 9:15 Date of Service: AM Medical Record [id number] Number: Patient Account Number: [id number] Date of Birth/Sex: 02-28-1951 (66 y.o. Male) Treating RN: Elyn Peers, Other Clinician: Primary Care Zaden Sako: Olivia Mackie  Treating STONE III, Charlynn Grimes, Abisai Deer/Extender: Referring Babak Lucus: Harless Nakayama in Treatment: 1 Wound Status Wound Number: 5 Primary Diabetic Wound/Ulcer of the Lower Etiology: Extremity Wound Location: Left Toe Great Wound Open Wounding Event: Gradually Appeared Status: Date Acquired: 06/11/2016 Comorbid Anemia, Lymphedema, Congestive Weeks Of Treatment: 1 History: Heart Failure, Hypertension, Peripheral Clustered Wound: No Venous Disease, Type II Diabetes, Pending Amputation On Presentation Neuropathy Photos Photo Uploaded  By: Montey Hora on 10/01/2016 11:29:21 Wound Measurements Length: (cm) 2 Width: (cm) 3.6 Depth: (cm) 0.1 Area: (cm) 5.655 Volume: (cm) 0.565 % Reduction in Area: 60.4% % Reduction in Volume: 60.5% Epithelialization: Small (1-33%) Tunneling: No Undermining: No Wound Description Classification: Grade 1 Foul Odor After Wound Margin: Flat and Intact Due to Product Exudate Amount: Large Slough/Fibrino Exudate Type: Serous Exudate Color: amber Cleansing: Yes Use: No Yes Wound Bed Swayne, Amad E. ([id number]) Granulation Amount: Large (67-100%) Exposed Structure Granulation Quality: Pink Fascia Exposed: No Necrotic Amount: Small (1-33%) Fat Layer (Subcutaneous Tissue) Exposed: No Necrotic Quality: Adherent Slough Tendon Exposed: No Muscle Exposed: No Joint Exposed: No Bone Exposed: No Periwound Skin Texture Texture Color No Abnormalities Noted: No No Abnormalities Noted: No Callus: No Atrophie Blanche: No Crepitus: No Cyanosis: No Excoriation: Yes Ecchymosis: No Induration: No Erythema: No Rash: No Hemosiderin Staining: No Scarring: No Mottled: No Pallor: No Moisture Rubor: No No Abnormalities Noted: No Dry / Scaly: No Temperature / Pain Maceration: Yes Temperature: No Abnormality Wound Preparation Ulcer Cleansing: Rinsed/Irrigated with Saline Topical Anesthetic Applied: Other: lidocaine 4%, Treatment  Notes Wound #5 (Left Toe Great) 1. Cleansed with: Clean wound with Normal Saline 4. Dressing Applied: Aquacel Ag 5. Secondary Dressing Applied Gauze and Kerlix/Conform 7. Secured with Tape Notes netting Electronic Signature(s) Signed: 10/01/2016 9:41:54 AM By: Montey Hora Entered By: Montey Hora on 10/01/2016 09:41:54 Gellerman, [name] ([id number]) -------------------------------------------------------------------------------- Wound Assessment Details Patient Name: JACLYN, CAREW 10/01/2016 9:15 Date of Service: AM Medical Record [id number] Number: Patient Account Number: [id number] Date of Birth/Sex: Sep 04, 1950 (66 y.o. Male) Treating RN: Elyn Peers, Other Clinician: Primary Care Alainna Stawicki: TRACY Treating STONE III, HOYT MCLEAN-SCOCOZZA, Thayne Cindric/Extender: Referring Anna Beaird: Harless Nakayama in Treatment: 1 Wound Status Wound Number: 6 Primary Diabetic Wound/Ulcer of the Lower Etiology: Extremity Wound Location: Left Toe Second Wound Open Wounding Event: Gradually Appeared Status: Date Acquired: 06/11/2016 Comorbid Anemia, Lymphedema, Congestive Weeks Of Treatment: 1 History: Heart Failure, Hypertension, Peripheral Clustered Wound: No Venous Disease, Type II Diabetes, Pending Amputation On Presentation Neuropathy Photos Photo Uploaded By: Montey Hora on 10/01/2016 11:28:34 Wound Measurements Length: (cm) 1 Width: (cm) 0.7 Depth: (cm) 0.1 Area: (cm) 0.55 Volume: (cm) 0.055 % Reduction in Area: 74.3% % Reduction in Volume: 74.3% Epithelialization: Small (1-33%) Tunneling: No Undermining: No Wound Description Classification: Grade 1 Foul Odor After Wound Margin: Flat and Intact Due to Product Exudate Amount: Large Slough/Fibrino Exudate Type: Serous Exudate Color: amber Cleansing: Yes Use: No Yes Wound Bed Yerian, Ignatz E. ([id number]) Granulation Amount: Large (67-100%) Exposed Structure Granulation Quality: Pink Fascia  Exposed: No Necrotic Amount: Small (1-33%) Fat Layer (Subcutaneous Tissue) Exposed: No Necrotic Quality: Adherent Slough Tendon Exposed: No Muscle Exposed: No Joint Exposed: No Bone Exposed: No Periwound Skin Texture Texture Color No Abnormalities Noted: No No Abnormalities Noted: No Callus: No Atrophie Blanche: No Crepitus: No Cyanosis: No Excoriation: Yes Ecchymosis: No Induration: No Erythema: No Rash: No Hemosiderin Staining: No Scarring: No Mottled: No Pallor: No Moisture Rubor: No No Abnormalities Noted: No Dry / Scaly: No Temperature / Pain Maceration: Yes Temperature: No Abnormality Wound Preparation Ulcer Cleansing: Rinsed/Irrigated with Saline Topical Anesthetic Applied: Other: lidocaine 4%, Treatment Notes Wound #6 (Left Toe Second) 1. Cleansed with: Clean wound with Normal Saline 4. Dressing Applied: Aquacel Ag 5. Secondary Dressing Applied Gauze and Kerlix/Conform 7. Secured with Tape Notes netting Electronic Signature(s) Signed: 10/01/2016 9:42:10 AM By: Montey Hora Entered By: Montey Hora on 10/01/2016 09:42:10 Barringer, [name] ([id number]) -------------------------------------------------------------------------------- Vitals Details Patient Name:  Kohl, Woodie E. 10/01/2016 9:15 Date of Service: AM Medical Record [id number] Number: Patient Account Number: [id number] Date of Birth/Sex: [birth year] (66 y.o. Male) Treating RN: Elyn Peers, Other Clinician: Primary Care Lillyanne Bradburn: Olivia Mackie Treating STONE III, [place], Olivea Sonnen/Extender: Referring Othell Diluzio: Harless Nakayama in Treatment: 1 Vital Signs Time Taken: 09:30 Temperature (F): 98.3 Height (in): 69 Pulse (bpm): 74 Weight (lbs): 168 Respiratory Rate (breaths/min): 18 Body Mass Index (BMI): 24.8 Blood Pressure (mmHg): 138/61 Reference Range: 80 - 120 mg / dl Electronic Signature(s) Signed: 10/01/2016 5:16:03 PM By: Montey Hora Entered By:  Montey Hora on 10/01/2016 09:31:34

## 2016-10-02 NOTE — Progress Notes (Signed)
[name], [name] ([id number]) Visit Report for 10/01/2016 Chief Complaint Document Details Patient Name: [name], [name] 10/01/2016 9:15 Date of Service: AM Medical Record [id number] Number: Patient Account Number: [id number] Date of Birth/Sex: 1950/12/15 (66 y.o. Male) Treating RN: [name], Other Clinician: Primary Care Provider: Olivia [name] Treating STONE III, [name], Provider/Extender: Referring Provider: Harless [name] in Treatment: 1 Information Obtained from: Patient Chief Complaint Patients presents for treatment of an open diabetic ulcer to both feet Electronic Signature(s) Signed: 10/01/2016 5:09:57 PM By: [name] Keeler PA-C Entered By: [name] on 10/01/2016 09:43:15 [place]angorden, [name] ([id number]) -------------------------------------------------------------------------------- HPI Details Patient Name: [name], [name] 10/01/2016 9:15 Date of Service: AM Medical Record [id number] Number: Patient Account Number: [id number] Date of Birth/Sex: 1950/11/19 (66 y.o. Male) Treating RN: [name], Other Clinician: Primary Care Provider: Olivia [name] Treating STONE III, [name], Provider/Extender: Referring Provider: Harless [name] in Treatment: 1 History of Present Illness Location: bilateral feet ulceration on the toes Quality: Patient reports experiencing a dull pain to affected area(s). Severity: Patient states wound are getting better Duration: Patient has had the wound for > 3 months prior to seeking treatment at the wound center Timing: Pain in wound is constant (hurts all the time) Context: The wound would happen gradually Modifying Factors: Other treatment(s) tried include:treatment for lymphedema and is seen by the podiatrist Dr. Caryl [name] Associated Signs and Symptoms: Patient reports having increase swelling. HPI Description: 66 year old patient here to see as for bilateral feet ulceration to on his left  first and second toe and 2 on his right first and second toe, which she's had for about 4 months. 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 Donella [name]. The patient is also undergoing workup by medical oncology for a intra-abdominal lymphadenopathy. 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 He is also status post appendectomy, inguinal lymph node biopsy, rectal biopsy and rectal examination under anesthesia. he currently smokes cigarettes about half packet a day. In March of this year he was seen by Dr. Hortencia [name], for evaluation of bilateral varicose veins and besides wearing compression stockings he had recommended laser ablation of the right and left great saphenous veins to eleviate the symptoms and complications of severe superficial venous reflux disease. He also recommended lymphedema pumps for better control of his lymphedema. The patient recently has had on 08/23/2016, right greater saphenous vein ablation with the laser energy Earlier lower extremity venous reflux examination done on 05/08/2016 showed no DVT or SVT both lower legs but incompetence of bilateral great saphenous veins was present. A lower arterial study was also done and there was no significant right lower and left lower extremity problems based on a normal toe brachial index bilaterally and the ABI was 1.221 the left and 1.23 on the right. His post ablation venous duplex examination showed successful ablation of the right GS vein with thrombus formation 2 below the right saphenofemoral junction. The deep system was patent without evidence of thrombosis and this was done on 08/30/2016. the patient also has a squamous cell cancer of the skin of the buttock and is recently  undergone radiation therapy for this prior to excisional surgery. Addendum: regarding his x-rays done today and x-ray of the left foot -- IMPRESSION: No objective [name], [name] ([id number])  evidence of osteomyelitis. There are soft tissue changes which may reflect cellulitis. X-ray of the right foot -- IMPRESSION:Findings compatible with cellulitis of the toes. No objective evidence of osteomyelitis is observed. 10/01/16 on evaluation today patient's wounds appeared to be doing some better. I did review the x-rays as well which showed no evidence of osteomyelitis although there was evidence on x-ray of cellulitis. He fortunately is not having any discomfort although he continues to have some swelling. He does not remember being on any antibiotics recently. Electronic Signature(s) Signed: 10/01/2016 5:09:57 PM By: [name] Keeler PA-C Entered By: [name] on 10/01/2016 09:54:38 [name] ([id number]) -------------------------------------------------------------------------------- Physical Exam Details Patient Name: [name], [name] 10/01/2016 9:15 Date of Service: AM Medical Record [id number] Number: Patient Account Number: [id number] Date of Birth/Sex: 1951/01/01 (66 y.o. Male) Treating RN: [name], Other Clinician: Primary Care Provider: TRACY Treating STONE III, [name], Provider/Extender: Referring Provider: Harless [name] in Treatment: 1 Constitutional Well-nourished and well-hydrated in no acute distress. Respiratory normal breathing without difficulty. clear to auscultation bilaterally. Cardiovascular regular rate and rhythm with normal S1, S2. 1+ dorsalis pedis/posterior tibialis pulses. no clubbing, cyanosis, significant edema, <3 sec cap refill. Psychiatric this patient is able to make decisions and demonstrates good insight into disease process. Alert and Oriented x 3. pleasant and cooperative. Notes Since bilateral total  ulcers appear to be doing well with a good granulation bed. There is some swelling and mild erythema surrounding the wound bed. Fortunately he does not appear to be tender on palpation. Electronic Signature(s) Signed: 10/01/2016 5:09:57 PM By: [name] Keeler PA-C Entered By: [name] on 10/01/2016 09:55:21 Emanuele, [name] ([id number]) -------------------------------------------------------------------------------- Physician Orders Details Patient Name: [name], [name] 10/01/2016 9:15 Date of Service: AM Medical Record [id number] Number: Patient Account Number: [id number] Date of Birth/Sex: 08-Jun-1950 (66 y.o. Male) Treating RN: [name], Other Clinician: Primary Care Provider: Olivia [name] Treating STONE III, [name], Provider/Extender: Referring Provider: Harless [name] in Treatment: 1 Verbal / Phone Orders: No Diagnosis Coding ICD-10 Coding Code Description E11.621 Type 2 diabetes mellitus with foot ulcer I87.313 Chronic venous hypertension (idiopathic) with ulcer of bilateral lower extremity I89.0 Lymphedema, not elsewhere classified L97.522 Non-pressure chronic ulcer of other part of left foot with fat layer exposed L97.512 Non-pressure chronic ulcer of other part of right foot with fat layer exposed F17.218 Nicotine dependence, cigarettes, 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 abuse with unspecified alcohol-induced disorder Wound Cleansing Wound #1 Right Toe Great o Cleanse wound with mild soap and water o May Shower, gently pat wound dry prior to applying new dressing. Wound #2 Right Toe Second o Cleanse wound with mild soap and water o May Shower, gently pat wound dry prior to applying new dressing. Wound #3 Right Toe Third o Cleanse wound with mild soap and water o May Shower, gently pat wound dry prior to applying new dressing. Wound #4 Right,Dorsal Foot o Cleanse wound with mild soap and water o May  Shower, gently pat wound dry prior to applying new dressing. Wound #5 Left Toe Great o Cleanse wound with mild soap and water o May Shower, gently pat wound dry prior to applying new dressing. Wound #6 Left Toe Second o Cleanse wound with mild soap and water Molinaro, Shaan E. ([id number]) o May Shower, gently pat wound dry prior to applying new dressing. Anesthetic Wound #1 Right Toe Great o Topical Lidocaine 4% cream applied to wound bed prior to debridement Wound #2 Right Toe Second o  Topical Lidocaine 4% cream applied to wound bed prior to debridement Wound #3 Right Toe Third o Topical Lidocaine 4% cream applied to wound bed prior to debridement Wound #4 Right,Dorsal Foot o Topical Lidocaine 4% cream applied to wound bed prior to debridement Wound #5 Left Toe Great o Topical Lidocaine 4% cream applied to wound bed prior to debridement Wound #6 Left Toe Second o Topical Lidocaine 4% cream applied to wound bed prior to debridement Primary Wound Dressing Wound #1 Right Toe Great o Aquacel Ag Wound #2 Right Toe Second o Aquacel Ag Wound #3 Right Toe Third o Aquacel Ag Wound #4 Right,Dorsal Foot o Aquacel Ag Wound #5 Left Toe Great o Aquacel Ag Wound #6 Left Toe Second o Aquacel Ag Secondary Dressing Wound #1 Right Toe Great o Gauze and Kerlix/Conform Wound #2 Right Toe Second o Gauze and Kerlix/Conform Wound #3 Right Toe Third Barthel, [name] ([id number]) o Gauze and Kerlix/Conform Wound #4 Right,Dorsal Foot o Gauze and Kerlix/Conform Wound #5 Left Toe Great o Gauze and Kerlix/Conform Wound #6 Left Toe Second o Gauze and Kerlix/Conform Dressing Change Frequency Wound #1 Right Toe Great o Change dressing every day. Wound #2 Right Toe Second o Change dressing every day. Wound #3 Right Toe Third o Change dressing every day. Wound #4 Right,Dorsal Foot o Change dressing every day. Wound #5 Left Toe Great o Change  dressing every day. Wound #6 Left Toe Second o Change dressing every day. Follow-up Appointments Wound #1 Right Toe Great o Return Appointment in 1 week. Wound #2 Right Toe Second o Return Appointment in 1 week. Wound #3 Right Toe Third o Return Appointment in 1 week. Wound #4 Right,Dorsal Foot o Return Appointment in 1 week. Wound #5 Left Toe Great o Return Appointment in 1 week. Wound #6 Left Toe Second o Return Appointment in 1 week. [name], [name] ([id number]) Edema Control Wound #1 Right Toe Great o Patient to wear own compression stockings o Elevate legs to the level of the heart and pump ankles as often as possible Wound #2 Right Toe Second o Patient to wear own compression stockings o Elevate legs to the level of the heart and pump ankles as often as possible Wound #3 Right Toe Third o Patient to wear own compression stockings o Elevate legs to the level of the heart and pump ankles as often as possible Wound #4 Right,Dorsal Foot o Patient to wear own compression stockings o Elevate legs to the level of the heart and pump ankles as often as possible Wound #5 Left Toe Great o Patient to wear own compression stockings o Elevate legs to the level of the heart and pump ankles as often as possible Wound #6 Left Toe Second o Patient to wear own compression stockings o Elevate legs to the level of the heart and pump ankles as often as possible Additional Orders / Instructions Wound #1 Right Toe Great o Stop Smoking - Please continue to stay away from alcohol o Other: - Please add vitamin A, vitamin C, and Zinc supplements to your diet Wound #2 Right Toe Second o Stop Smoking - Please continue to stay away from alcohol o Other: - Please add vitamin A, vitamin C, and Zinc supplements to your diet Wound #3 Right Toe Third o Stop Smoking - Please continue to stay away from alcohol o Other: - Please add vitamin A, vitamin C,  and Zinc supplements to your diet Wound #4 Right,Dorsal Foot o Stop Smoking - Please continue to stay  away from alcohol o Other: - Please add vitamin A, vitamin C, and Zinc supplements to your diet Wound #5 Left Toe Great o Stop Smoking - Please continue to stay away from alcohol o Other: - Please add vitamin A, vitamin C, and Zinc supplements to your diet Wound #6 Left Toe Second o Stop Smoking - Please continue to stay away from alcohol [name], [name] ([id number]) o Other: - Please add vitamin A, vitamin C, and Zinc supplements to your diet Home Health Wound #1 Right Toe [place] Visits o Home Health Nurse may visit PRN to address patientos wound care needs. o FACE TO FACE ENCOUNTER: MEDICARE and MEDICAID PATIENTS: I certify that this patient is under my care and that I had a face-to-face encounter that meets the physician face-to-face encounter requirements with this patient on this date. The encounter with the patient was in whole or in part for the following MEDICAL CONDITION: (primary reason for [place]) MEDICAL NECESSITY: I certify, that based on my findings, NURSING services are a medically necessary home health service. HOME BOUND STATUS: I certify that my clinical findings support that this patient is homebound (i.e., Due to illness or injury, pt requires aid of supportive devices such as crutches, cane, wheelchairs, walkers, the use of special transportation or the assistance of another person to leave their place of residence. There is a normal inability to leave the home and doing so requires considerable and taxing effort. Other absences are for medical reasons / religious services and are infrequent or of short duration when for other reasons). o If current dressing causes regression in wound condition, may D/C ordered dressing product/s and apply Normal Saline Moist Dressing daily until next [place] / Other  MD appointment. [place] of regression in wound condition at [phone]. o Please direct any NON-WOUND related issues/requests for orders to patient's Primary Care Physician Wound #2 Right Toe Second o [place] Nurse may visit PRN to address patientos wound care needs. o FACE TO FACE ENCOUNTER: MEDICARE and MEDICAID PATIENTS: I certify that this patient is under my care and that I had a face-to-face encounter that meets the physician face-to-face encounter requirements with this patient on this date. The encounter with the patient was in whole or in part for the following MEDICAL CONDITION: (primary reason for [place]) MEDICAL NECESSITY: I certify, that based on my findings, NURSING services are a medically necessary home health service. HOME BOUND STATUS: I certify that my clinical findings support that this patient is homebound (i.e., Due to illness or injury, pt requires aid of supportive devices such as crutches, cane, wheelchairs, walkers, the use of special transportation or the assistance of another person to leave their place of residence. There is a normal inability to leave the home and doing so requires considerable and taxing effort. Other absences are for medical reasons / religious services and are infrequent or of short duration when for other reasons). o If current dressing causes regression in wound condition, may D/C ordered dressing product/s and apply Normal Saline Moist Dressing daily until next [place] / Other MD appointment. [place] of regression in wound condition at [phone]. o Please direct any NON-WOUND related issues/requests for orders to patient's Primary Care Physician Wound #3 Right Toe [place] Nurse may visit PRN to address patientos wound care needs. [name], CHALKER ([id number]) o FACE  TO FACE  ENCOUNTER: MEDICARE and MEDICAID PATIENTS: I certify that this patient is under my care and that I had a face-to-face encounter that meets the physician face-to-face encounter requirements with this patient on this date. The encounter with the patient was in whole or in part for the following MEDICAL CONDITION: (primary reason for [place]) MEDICAL NECESSITY: I certify, that based on my findings, NURSING services are a medically necessary home health service. HOME BOUND STATUS: I certify that my clinical findings support that this patient is homebound (i.e., Due to illness or injury, pt requires aid of supportive devices such as crutches, cane, wheelchairs, walkers, the use of special transportation or the assistance of another person to leave their place of residence. There is a normal inability to leave the home and doing so requires considerable and taxing effort. Other absences are for medical reasons / religious services and are infrequent or of short duration when for other reasons). o If current dressing causes regression in wound condition, may D/C ordered dressing product/s and apply Normal Saline Moist Dressing daily until next [place] / Other MD appointment. [place] of regression in wound condition at [phone]. o Please direct any NON-WOUND related issues/requests for orders to patient's Primary Care Physician Wound #4 [place] Nurse may visit PRN to address patientos wound care needs. o FACE TO FACE ENCOUNTER: MEDICARE and MEDICAID PATIENTS: I certify that this patient is under my care and that I had a face-to-face encounter that meets the physician face-to-face encounter requirements with this patient on this date. The encounter with the patient was in whole or in part for the following MEDICAL CONDITION: (primary reason for [place]) MEDICAL NECESSITY: I certify,  that based on my findings, NURSING services are a medically necessary home health service. HOME BOUND STATUS: I certify that my clinical findings support that this patient is homebound (i.e., Due to illness or injury, pt requires aid of supportive devices such as crutches, cane, wheelchairs, walkers, the use of special transportation or the assistance of another person to leave their place of residence. There is a normal inability to leave the home and doing so requires considerable and taxing effort. Other absences are for medical reasons / religious services and are infrequent or of short duration when for other reasons). o If current dressing causes regression in wound condition, may D/C ordered dressing product/s and apply Normal Saline Moist Dressing daily until next [place] / Other MD appointment. [place] of regression in wound condition at [phone]. o Please direct any NON-WOUND related issues/requests for orders to patient's Primary Care Physician Wound #5 Left Toe [place] Nurse may visit PRN to address patientos wound care needs. o FACE TO FACE ENCOUNTER: MEDICARE and MEDICAID PATIENTS: I certify that this patient is under my care and that I had a face-to-face encounter that meets the physician face-to-face encounter requirements with this patient on this date. The encounter with the patient was in whole or in part for the following MEDICAL CONDITION: (primary reason for [place]) MEDICAL NECESSITY: I certify, that based on my findings, NURSING services are a medically necessary home health service. HOME BOUND STATUS: I certify that my clinical findings support that this patient is homebound (i.e., Due to illness or injury, pt requires aid of supportive devices such as crutches, cane, wheelchairs, walkers, the use of special [place], Linkon E. (  [id number]) transportation or the assistance of  another person to leave their place of residence. There is a normal inability to leave the home and doing so requires considerable and taxing effort. Other absences are for medical reasons / religious services and are infrequent or of short duration when for other reasons). o If current dressing causes regression in wound condition, may D/C ordered dressing product/s and apply Normal Saline Moist Dressing daily until next [place] / Other MD appointment. [place] of regression in wound condition at [phone]. o Please direct any NON-WOUND related issues/requests for orders to patient's Primary Care Physician Wound #6 Left Toe Second o [place] Nurse may visit PRN to address patientos wound care needs. o FACE TO FACE ENCOUNTER: MEDICARE and MEDICAID PATIENTS: I certify that this patient is under my care and that I had a face-to-face encounter that meets the physician face-to-face encounter requirements with this patient on this date. The encounter with the patient was in whole or in part for the following MEDICAL CONDITION: (primary reason for [place]) MEDICAL NECESSITY: I certify, that based on my findings, NURSING services are a medically necessary home health service. HOME BOUND STATUS: I certify that my clinical findings support that this patient is homebound (i.e., Due to illness or injury, pt requires aid of supportive devices such as crutches, cane, wheelchairs, walkers, the use of special transportation or the assistance of another person to leave their place of residence. There is a normal inability to leave the home and doing so requires considerable and taxing effort. Other absences are for medical reasons / religious services and are infrequent or of short duration when for other reasons). o If current dressing causes regression in wound condition, may D/C ordered dressing product/s and apply  Normal Saline Moist Dressing daily until next [place] / Other MD appointment. [place] of regression in wound condition at [phone]. o Please direct any NON-WOUND related issues/requests for orders to patient's Primary Care Physician Patient Medications Allergies: No Known Drug Allergies Notifications Medication Indication Start End doxycycline hyclate 10/01/2016 DOSE 1 - oral 100 mg capsule - 1 capsule oral taken every 12 hour for 10 days Notes Gonna recommend that we continue with the Current wound care measures although I will send them the prescription doxycycline for him as well. We will reevaluate in one week and see were things stand at that time. Anything worsens in the interim he will contact the office for additional recommendations. Electronic Signature(s) Signed: 10/01/2016 9:57:15 AM By: [name] Keeler PA-C Entered By: [name] on 10/01/2016 09:57:12 Moree, DILLIN LOFGREN ([id number]) RONDO, SPITTLER ([id number]) -------------------------------------------------------------------------------- Problem List Details Patient Name: ANSELMO, REIHL 10/01/2016 9:15 Date of Service: AM Medical Record [id number] Number: Patient Account Number: [id number] Date of Birth/Sex: 12/07/1950 (66 y.o. Male) Treating RN: [name], Other Clinician: Primary Care Provider: Olivia [name] Treating STONE III, [name], Provider/Extender: Referring Provider: Harless [name] in Treatment: 1 Active Problems ICD-10 Encounter Code Description Active Date Diagnosis E11.621 Type 2 diabetes mellitus with foot ulcer 09/21/2016 Yes I87.313 Chronic venous hypertension (idiopathic) with ulcer of 09/21/2016 Yes bilateral lower extremity I89.0 Lymphedema, not elsewhere classified 09/21/2016 Yes L97.522 Non-pressure chronic ulcer of other part of left foot with fat 09/21/2016 Yes layer exposed L97.512 Non-pressure chronic ulcer of other part of  right foot with 09/21/2016 Yes fat layer exposed F17.218 Nicotine dependence, cigarettes, 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/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 F10.19 Alcohol  abuse with unspecified alcohol-induced disorder 09/21/2016 Yes Inactive Problems Resolved Problems Electronic Signature(s) FOUAD, TAUL ([id number]) Signed: 10/01/2016 5:09:57 PM By: [name] Keeler PA-C Entered By: [name] on 10/01/2016 09:42:30 [place], [name] ([id number]) -------------------------------------------------------------------------------- Progress Note Details Patient Name: [name], [name] 10/01/2016 9:15 Date of Service: AM Medical Record [id number] Number: Patient Account Number: [id number] Date of Birth/Sex: 1950-04-22 (66 y.o. Male) Treating RN: [name], Other Clinician: Primary Care Provider: Olivia [name] Treating STONE III, HOYT MCLEAN-SCOCOZZA, Provider/Extender: Referring Provider: Harless [name] in Treatment: 1 Subjective Chief Complaint Information obtained from Patient Patients presents for treatment of an open diabetic ulcer to both feet History of Present Illness (HPI) The following HPI elements were documented for the patient's wound: Location: bilateral feet ulceration on the toes Quality: Patient reports experiencing a dull pain to affected area(s). Severity: Patient states wound are getting better Duration: Patient has had the wound for > 3 months prior to seeking treatment at the wound center Timing: Pain in wound is constant (hurts all the time) Context: The wound would happen gradually Modifying Factors: Other treatment(s) tried include:treatment for lymphedema and is seen by the podiatrist Dr. Caryl [name] Associated Signs and Symptoms: Patient reports having increase swelling. 66 year old patient here to see as for bilateral feet ulceration to on his left first and second toe and 2 on his right first and second toe, which she's had for about 4 months. 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 Donella [name]. The patient is also undergoing workup by medical oncology for a intra- abdominal lymphadenopathy. 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 He is also status post appendectomy, inguinal lymph node biopsy, rectal biopsy and rectal examination under anesthesia. he currently smokes cigarettes about half packet a day. In March of this year he was seen by Dr. Hortencia [name], for evaluation of bilateral varicose veins and besides wearing compression stockings he had recommended laser ablation of the right and left great saphenous veins to eleviate the symptoms and complications of severe superficial venous reflux disease. He also recommended lymphedema pumps for better control of his lymphedema. The patient recently has had on 08/23/2016, right greater saphenous vein ablation with the laser energy Earlier lower extremity venous reflux examination done on 05/08/2016 showed no DVT or SVT both lower legs but incompetence of bilateral great saphenous veins was present. A lower arterial study was also done and there was no significant right lower and left lower extremity problems based on a normal toe brachial index bilaterally and the ABI was 1.221 the left and 1.23 on the Enriquez, Raza E. ([id number]) right. His post ablation venous duplex examination showed successful ablation of the right GS vein with thrombus formation 2 below the right saphenofemoral junction. The deep system was patent without evidence of thrombosis and this was done on 08/30/2016. 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 Addendum:  regarding his x-rays done today and x-ray of the left foot -- IMPRESSION: No objective evidence of osteomyelitis. There are soft tissue changes which may reflect cellulitis. X-ray of the right foot -- IMPRESSION:Findings compatible with cellulitis of the toes. No objective evidence of osteomyelitis is observed. 10/01/16 on evaluation  today patient's wounds appeared to be doing some better. I did review the x-rays as well which showed no evidence of osteomyelitis although there was evidence on x-ray of cellulitis. He fortunately is not having any discomfort although he continues to have some swelling. He does not remember being on any antibiotics recently. Objective Constitutional Well-nourished and well-hydrated in no acute distress. Vitals Time Taken: 9:30 AM, Height: 69 in, Weight: 168 lbs, BMI: 24.8, Temperature: 98.3 F, Pulse: 74 bpm, Respiratory Rate: 18 breaths/min, Blood Pressure: 138/61 mmHg. Respiratory normal breathing without difficulty. clear to auscultation bilaterally. Cardiovascular regular rate and rhythm with normal S1, S2. 1+ dorsalis pedis/posterior tibialis pulses. no clubbing, cyanosis, significant edema, Psychiatric this patient is able to make decisions and demonstrates good insight into disease process. Alert and Oriented x 3. pleasant and cooperative. General Notes: Since bilateral total ulcers appear to be doing well with a good granulation bed. There is some swelling and mild erythema surrounding the wound bed. Fortunately he does not appear to be tender on palpation. Integumentary (Hair, Skin) Wound #1 status is Open. Original cause of wound was Gradually Appeared. The wound is located on the Right Toe Great. The wound measures 7.6cm length x 6.9cm width x 0.1cm depth; 41.186cm^2 area and 4.119cm^3 volume. There is no tunneling or undermining noted. There is a large amount of serous drainage Ravert, Murel E. ([id number]) noted. The wound margin is flat and  intact. There is large (67-100%) pink granulation within the wound bed. There is a small (1-33%) amount of necrotic tissue within the wound bed including Adherent Slough. The periwound skin appearance exhibited: Excoriation, Maceration. The periwound skin appearance did not exhibit: Callus, Crepitus, Induration, Rash, Scarring, Dry/Scaly, Atrophie Blanche, Cyanosis, Ecchymosis, Hemosiderin Staining, Mottled, Pallor, Rubor, Erythema. Periwound temperature was noted as No Abnormality. Wound #2 status is Open. Original cause of wound was Gradually Appeared. The wound is located on the Right Toe Second. The wound measures 1.3cm length x 2.1cm width x 0.1cm depth; 2.144cm^2 area and 0.214cm^3 volume. There is no tunneling or undermining noted. There is a large amount of serous drainage noted. The wound margin is flat and intact. There is large (67-100%) pink granulation within the wound bed. There is a small (1-33%) amount of necrotic tissue within the wound bed including Adherent Slough. The periwound skin appearance exhibited: Excoriation, Maceration. The periwound skin appearance did not exhibit: Callus, Crepitus, Induration, Rash, Scarring, Dry/Scaly, Atrophie Blanche, Cyanosis, Ecchymosis, Hemosiderin Staining, Mottled, Pallor, Rubor, Erythema. Periwound temperature was noted as No Abnormality. Wound #3 status is Open. Original cause of wound was Gradually Appeared. The wound is located on the Right Toe Third. The wound measures 0.8cm length x 0.9cm width x 0.1cm depth; 0.565cm^2 area and 0.057cm^3 volume. There is no tunneling or undermining noted. There is a large amount of serous drainage noted. The wound margin is flat and intact. There is large (67-100%) pink granulation within the wound bed. There is a small (1-33%) amount of necrotic tissue within the wound bed including Adherent Slough. The periwound skin appearance exhibited: Excoriation, Maceration. The periwound skin appearance did  not exhibit: Callus, Crepitus, Induration, Rash, Scarring, Dry/Scaly, Atrophie Blanche, Cyanosis, Ecchymosis, Hemosiderin Staining, Mottled, Pallor, Rubor, Erythema. Periwound temperature was noted as No Abnormality. Wound #4 status is Open. Original cause of wound was Gradually Appeared. The wound is located on the Right,Dorsal Foot. The wound measures 0.5cm length x 0.7cm width x 0.1cm depth; 0.275cm^2 area and 0.027cm^3 volume. There is no tunneling or undermining noted. There is  a large amount of serous drainage noted. The wound margin is flat and intact. There is large (67-100%) pink granulation within the wound bed. There is a small (1-33%) amount of necrotic tissue within the wound bed including Adherent Slough. The periwound skin appearance exhibited: Excoriation, Maceration. The periwound skin appearance did not exhibit: Callus, Crepitus, Induration, Rash, Scarring, Dry/Scaly, Atrophie Blanche, Cyanosis, Ecchymosis, Hemosiderin Staining, Mottled, Pallor, Rubor, Erythema. Periwound temperature was noted as No Abnormality. Wound #5 status is Open. Original cause of wound was Gradually Appeared. The wound is located on the Left Toe Great. The wound measures 2cm length x 3.6cm width x 0.1cm depth; 5.655cm^2 area and 0.565cm^3 volume. There is no tunneling or undermining noted. There is a large amount of serous drainage noted. The wound margin is flat and intact. There is large (67-100%) pink granulation within the wound bed. There is a small (1-33%) amount of necrotic tissue within the wound bed including Adherent Slough. The periwound skin appearance exhibited: Excoriation, Maceration. The periwound skin appearance did not exhibit: Callus, Crepitus, Induration, Rash, Scarring, Dry/Scaly, Atrophie Blanche, Cyanosis, Ecchymosis, Hemosiderin Staining, Mottled, Pallor, Rubor, Erythema. Periwound temperature was noted as No Abnormality. Wound #6 status is Open. Original cause of wound was  Gradually Appeared. The wound is located on the Left Toe Second. The wound measures 1cm length x 0.7cm width x 0.1cm depth; 0.55cm^2 area and 0.055cm^3 volume. There is no tunneling or undermining noted. There is a large amount of serous drainage noted. The wound margin is flat and intact. There is large (67-100%) pink granulation within the wound bed. Johnsey, Franklyn E. ([id number]) There is a small (1-33%) amount of necrotic tissue within the wound bed including Adherent Slough. The periwound skin appearance exhibited: Excoriation, Maceration. The periwound skin appearance did not exhibit: Callus, Crepitus, Induration, Rash, Scarring, Dry/Scaly, Atrophie Blanche, Cyanosis, Ecchymosis, Hemosiderin Staining, Mottled, Pallor, Rubor, Erythema. Periwound temperature was noted as No Abnormality. Assessment Active Problems ICD-10 E11.621 - Type 2 diabetes mellitus with foot ulcer I87.313 - Chronic venous hypertension (idiopathic) with ulcer of bilateral lower extremity I89.0 - Lymphedema, not elsewhere classified L97.522 - Non-pressure chronic ulcer of other part of left foot with fat layer exposed L97.512 - Non-pressure chronic ulcer of other part of right foot with fat layer exposed F17.218 - Nicotine dependence, cigarettes, with other nicotine-induced disorders F10.19 - Alcohol abuse with unspecified alcohol-induced disorder Plan Wound Cleansing: Wound #1 Right Toe Great: Cleanse wound with mild soap and water May Shower, gently pat wound dry prior to applying new dressing. Wound #2 Right Toe Second: Cleanse wound with mild soap and water May Shower, gently pat wound dry prior to applying new dressing. Wound #3 Right Toe Third: Cleanse wound with mild soap and water May Shower, gently pat wound dry prior to applying new dressing. Wound #4 Right,Dorsal Foot: Cleanse wound with mild soap and water May Shower, gently pat wound dry prior to applying new dressing. Wound #5 Left Toe  Great: Cleanse wound with mild soap and water May Shower, gently pat wound dry prior to applying new dressing. Wound #6 Left Toe Second: Cleanse wound with mild soap and water May Shower, gently pat wound dry prior to applying new dressing. Anesthetic: Wound #1 Right Toe Great: [name], KYDD. ([id number]) Topical Lidocaine 4% cream applied to wound bed prior to debridement Wound #2 Right Toe Second: Topical Lidocaine 4% cream applied to wound bed prior to debridement Wound #3 Right Toe Third: Topical Lidocaine 4% cream applied to wound bed prior to debridement  Wound #4 Right,Dorsal Foot: Topical Lidocaine 4% cream applied to wound bed prior to debridement Wound #5 Left Toe Great: Topical Lidocaine 4% cream applied to wound bed prior to debridement Wound #6 Left Toe Second: Topical Lidocaine 4% cream applied to wound bed prior to debridement Primary Wound Dressing: Wound #1 Right Toe Great: Aquacel Ag Wound #2 Right Toe Second: Aquacel Ag Wound #3 Right Toe Third: Aquacel Ag Wound #4 Right,Dorsal Foot: Aquacel Ag Wound #5 Left Toe Great: Aquacel Ag Wound #6 Left Toe Second: Aquacel Ag Secondary Dressing: Wound #1 Right Toe Great: Gauze and Kerlix/Conform Wound #2 Right Toe Second: Gauze and Kerlix/Conform Wound #3 Right Toe Third: Gauze and Kerlix/Conform Wound #4 Right,Dorsal Foot: Gauze and Kerlix/Conform Wound #5 Left Toe Great: Gauze and Kerlix/Conform Wound #6 Left Toe Second: Gauze and Kerlix/Conform Dressing Change Frequency: Wound #1 Right Toe Great: Change dressing every day. Wound #2 Right Toe Second: Change dressing every day. Wound #3 Right Toe Third: Change dressing every day. Wound #4 Right,Dorsal Foot: Change dressing every day. Wound #5 Left Toe Great: Change dressing every day. Wound #6 Left Toe Second: Change dressing every day. Follow-up Appointments: LORI, LIEW ([id number]) Wound #1 Right Toe Great: Return Appointment in 1  week. Wound #2 Right Toe Second: Return Appointment in 1 week. Wound #3 Right Toe Third: Return Appointment in 1 week. Wound #4 Right,Dorsal Foot: Return Appointment in 1 week. Wound #5 Left Toe Great: Return Appointment in 1 week. Wound #6 Left Toe Second: Return Appointment in 1 week. Edema Control: Wound #1 Right Toe Great: Patient to wear own compression stockings Elevate legs to the level of the heart and pump ankles as often as possible Wound #2 Right Toe Second: Patient to wear own compression stockings Elevate legs to the level of the heart and pump ankles as often as possible Wound #3 Right Toe Third: Patient to wear own compression stockings Elevate legs to the level of the heart and pump ankles as often as possible Wound #4 Right,Dorsal Foot: Patient to wear own compression stockings Elevate legs to the level of the heart and pump ankles as often as possible Wound #5 Left Toe Great: Patient to wear own compression stockings Elevate legs to the level of the heart and pump ankles as often as possible Wound #6 Left Toe Second: Patient to wear own compression stockings Elevate legs to the level of the heart and pump ankles as often as possible Additional Orders / Instructions: Wound #1 Right Toe Great: Stop Smoking - Please continue to stay away from alcohol Other: - Please add vitamin A, vitamin C, and Zinc supplements to your diet Wound #2 Right Toe Second: Stop Smoking - Please continue to stay away from alcohol Other: - Please add vitamin A, vitamin C, and Zinc supplements to your diet Wound #3 Right Toe Third: Stop Smoking - Please continue to stay away from alcohol Other: - Please add vitamin A, vitamin C, and Zinc supplements to your diet Wound #4 Right,Dorsal Foot: Stop Smoking - Please continue to stay away from alcohol Other: - Please add vitamin A, vitamin C, and Zinc supplements to your diet Wound #5 Left Toe Great: Stop Smoking - Please continue to stay  away from alcohol Other: - Please add vitamin A, vitamin C, and Zinc supplements to your diet Wound #6 Left Toe Second: Stop Smoking - Please continue to stay away from alcohol Other: - Please add vitamin A, vitamin C, and Zinc supplements to your diet Home Health: [name],  [name] ([id number]) Wound #1 Right Toe Great: [place] Nurse may visit PRN to address patient s wound care needs. FACE TO FACE ENCOUNTER: MEDICARE and MEDICAID PATIENTS: I certify that this patient is under my care and that I had a face-to-face encounter that meets the physician face-to-face encounter requirements with this patient on this date. The encounter with the patient was in whole or in part for the following MEDICAL CONDITION: (primary reason for [place]) MEDICAL NECESSITY: I certify, that based on my findings, NURSING services are a medically necessary home health service. HOME BOUND STATUS: I certify that my clinical findings support that this patient is homebound (i.e., Due to illness or injury, pt requires aid of supportive devices such as crutches, cane, wheelchairs, walkers, the use of special transportation or the assistance of another person to leave their place of residence. There is a normal inability to leave the home and doing so requires considerable and taxing effort. Other absences are for medical reasons / religious services and are infrequent or of short duration when for other reasons). If current dressing causes regression in wound condition, may D/C ordered dressing product/s and apply Normal Saline Moist Dressing daily until next [place] / Other MD appointment. [place] of regression in wound condition at [phone]. Please direct any NON-WOUND related issues/requests for orders to patient's Primary Care Physician Wound #2 Right Toe Second: [place] Nurse may visit PRN to address patient s  wound care needs. FACE TO FACE ENCOUNTER: MEDICARE and MEDICAID PATIENTS: I certify that this patient is under my care and that I had a face-to-face encounter that meets the physician face-to-face encounter requirements with this patient on this date. The encounter with the patient was in whole or in part for the following MEDICAL CONDITION: (primary reason for [place]) MEDICAL NECESSITY: I certify, that based on my findings, NURSING services are a medically necessary home health service. HOME BOUND STATUS: I certify that my clinical findings support that this patient is homebound (i.e., Due to illness or injury, pt requires aid of supportive devices such as crutches, cane, wheelchairs, walkers, the use of special transportation or the assistance of another person to leave their place of residence. There is a normal inability to leave the home and doing so requires considerable and taxing effort. Other absences are for medical reasons / religious services and are infrequent or of short duration when for other reasons). If current dressing causes regression in wound condition, may D/C ordered dressing product/s and apply Normal Saline Moist Dressing daily until next [place] / Other MD appointment. [place] of regression in wound condition at [phone]. Please direct any NON-WOUND related issues/requests for orders to patient's Primary Care Physician Wound #3 Right Toe Third: [place] Nurse may visit PRN to address patient s wound care needs. FACE TO FACE ENCOUNTER: MEDICARE and MEDICAID PATIENTS: I certify that this patient is under my care and that I had a face-to-face encounter that meets the physician face-to-face encounter requirements with this patient on this date. The encounter with the patient was in whole or in part for the following MEDICAL CONDITION: (primary reason for [place]) MEDICAL NECESSITY: I  certify, that based on my findings, NURSING services are a medically necessary home health service. HOME BOUND STATUS: I certify that my clinical findings support that this patient is homebound (i.e., Due to illness or injury,  pt requires aid of supportive devices such as crutches, cane, wheelchairs, walkers, the use of special transportation or the assistance of another person to leave their place of residence. There is a normal inability to leave the home and doing so requires considerable and taxing effort. Other absences are for medical reasons / religious services and are infrequent or of short duration when for other reasons). If current dressing causes regression in wound condition, may D/C ordered dressing product/s and apply Normal Saline Moist Dressing daily until next [place] / Other MD appointment. [place] of regression in wound condition at [phone]. Please direct any NON-WOUND related issues/requests for orders to patient's Primary Care Physician NYCERE, PRESLEY ([id number]) Wound #4 Right,Dorsal Foot: [place] Nurse may visit PRN to address patient s wound care needs. FACE TO FACE ENCOUNTER: MEDICARE and MEDICAID PATIENTS: I certify that this patient is under my care and that I had a face-to-face encounter that meets the physician face-to-face encounter requirements with this patient on this date. The encounter with the patient was in whole or in part for the following MEDICAL CONDITION: (primary reason for [place]) MEDICAL NECESSITY: I certify, that based on my findings, NURSING services are a medically necessary home health service. HOME BOUND STATUS: I certify that my clinical findings support that this patient is homebound (i.e., Due to illness or injury, pt requires aid of supportive devices such as crutches, cane, wheelchairs, walkers, the use of special transportation or the assistance of another  person to leave their place of residence. There is a normal inability to leave the home and doing so requires considerable and taxing effort. Other absences are for medical reasons / religious services and are infrequent or of short duration when for other reasons). If current dressing causes regression in wound condition, may D/C ordered dressing product/s and apply Normal Saline Moist Dressing daily until next [place] / Other MD appointment. [place] of regression in wound condition at [phone]. Please direct any NON-WOUND related issues/requests for orders to patient's Primary Care Physician Wound #5 Left Toe Great: [place] Nurse may visit PRN to address patient s wound care needs. FACE TO FACE ENCOUNTER: MEDICARE and MEDICAID PATIENTS: I certify that this patient is under my care and that I had a face-to-face encounter that meets the physician face-to-face encounter requirements with this patient on this date. The encounter with the patient was in whole or in part for the following MEDICAL CONDITION: (primary reason for [place]) MEDICAL NECESSITY: I certify, that based on my findings, NURSING services are a medically necessary home health service. HOME BOUND STATUS: I certify that my clinical findings support that this patient is homebound (i.e., Due to illness or injury, pt requires aid of supportive devices such as crutches, cane, wheelchairs, walkers, the use of special transportation or the assistance of another person to leave their place of residence. There is a normal inability to leave the home and doing so requires considerable and taxing effort. Other absences are for medical reasons / religious services and are infrequent or of short duration when for other reasons). If current dressing causes regression in wound condition, may D/C ordered dressing product/s and apply Normal Saline Moist Dressing daily  until next [place] / Other MD appointment. [place] of regression in wound condition at [phone]. Please direct any NON-WOUND related issues/requests for orders to patient's Primary Care Physician  Wound #6 Left Toe Second: [place] Nurse may visit PRN to address patient s wound care needs. FACE TO FACE ENCOUNTER: MEDICARE and MEDICAID PATIENTS: I certify that this patient is under my care and that I had a face-to-face encounter that meets the physician face-to-face encounter requirements with this patient on this date. The encounter with the patient was in whole or in part for the following MEDICAL CONDITION: (primary reason for [place]) MEDICAL NECESSITY: I certify, that based on my findings, NURSING services are a medically necessary home health service. HOME BOUND STATUS: I certify that my clinical findings support that this patient is homebound (i.e., Due to illness or injury, pt requires aid of supportive devices such as crutches, cane, wheelchairs, walkers, the use of special transportation or the assistance of another person to leave their place of residence. There is a normal inability to leave the home and doing so requires considerable and taxing effort. Other absences are for medical reasons / religious services and are infrequent or of short duration when for other reasons). If current dressing causes regression in wound condition, may D/C ordered dressing product/s and apply Normal Saline Moist Dressing daily until next [place] / Other MD appointment. [place] of regression in wound condition at [phone]. Please direct any NON-WOUND related issues/requests for orders to patient's Primary Care Physician [place], [name] ([id number]) The following medication(s) was prescribed: doxycycline hyclate oral 100 mg capsule 1 1 capsule oral taken every 12 hour for 10 days starting  10/01/2016 General Notes: Gonna recommend that we continue with the Current wound care measures although I will send them the prescription doxycycline for him as well. We will reevaluate in one week and see were things stand at that time. Anything worsens in the interim he will contact the office for additional recommendations. Electronic Signature(s) Signed: 10/01/2016 5:09:57 PM By: [name] Keeler PA-C Entered By: [name] on 10/01/2016 09:57:28 Haith, [name] ([id number]) -------------------------------------------------------------------------------- SuperBill Details Patient Name: Kunert, Bonner E. Date of Service: 10/01/2016 Medical Record Patient Account Number: [id number] Number: Treating RN: Montey Hora Date of Birth/Sex: November 14, 1950 (66 y.o. 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.512 Non-pressure chronic ulcer of other part of right foot with fat layer exposed F17.218 Nicotine dependence, cigarettes, 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 abuse with unspecified alcohol-induced disorder Facility Procedures CPT4 Code: 38882800 Description: 34917 - WOUND CARE VISIT-LEV 5 EST PT Modifier: Quantity: 1 Physician Procedures CPT4: Description Modifier Quantity Code [id number] - WC PHYS LEVEL 3 - EST PT 1 ICD-10 Description Diagnosis E11.621 Type 2 diabetes mellitus with foot ulcer I87.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

## 2016-10-03 ENCOUNTER — Inpatient Hospital Stay: Payer: Medicare HMO

## 2016-10-03 VITALS — BP 149/85 | HR 75 | Resp 18

## 2016-10-03 DIAGNOSIS — D509 Iron deficiency anemia, unspecified: Secondary | ICD-10-CM

## 2016-10-03 MED ORDER — SODIUM CHLORIDE 0.9 % IV SOLN
510.0000 mg | Freq: Once | INTRAVENOUS | Status: AC
  Administered 2016-10-03: 510 mg via INTRAVENOUS
  Filled 2016-10-03: qty 17

## 2016-10-03 MED ORDER — SODIUM CHLORIDE 0.9 % IV SOLN
Freq: Once | INTRAVENOUS | Status: AC
  Administered 2016-10-03: 13:00:00 via INTRAVENOUS
  Filled 2016-10-03: qty 1000

## 2016-10-04 ENCOUNTER — Encounter: Payer: Self-pay | Admitting: *Deleted

## 2016-10-04 ENCOUNTER — Ambulatory Visit
Admission: RE | Admit: 2016-10-04 | Discharge: 2016-10-04 | Disposition: A | Discharge status: Home / Self Care | Payer: Medicare HMO | Source: Ambulatory Visit | Attending: Gastroenterology | Admitting: Gastroenterology

## 2016-10-04 ENCOUNTER — Encounter
Admission: RE | Disposition: A | Discharge status: Home / Self Care | Payer: Self-pay | Source: Ambulatory Visit | Attending: Gastroenterology

## 2016-10-04 ENCOUNTER — Other Ambulatory Visit: Payer: Self-pay | Admitting: *Deleted

## 2016-10-04 ENCOUNTER — Other Ambulatory Visit: Payer: Self-pay

## 2016-10-04 ENCOUNTER — Ambulatory Visit: Payer: Medicare HMO | Admitting: Registered Nurse

## 2016-10-04 DIAGNOSIS — D509 Iron deficiency anemia, unspecified: Secondary | ICD-10-CM | POA: Diagnosis not present

## 2016-10-04 DIAGNOSIS — I509 Heart failure, unspecified: Secondary | ICD-10-CM | POA: Diagnosis not present

## 2016-10-04 DIAGNOSIS — R590 Localized enlarged lymph nodes: Secondary | ICD-10-CM

## 2016-10-04 DIAGNOSIS — Z7984 Long term (current) use of oral hypoglycemic drugs: Secondary | ICD-10-CM | POA: Diagnosis not present

## 2016-10-04 DIAGNOSIS — D120 Benign neoplasm of cecum: Secondary | ICD-10-CM | POA: Diagnosis not present

## 2016-10-04 DIAGNOSIS — K7031 Alcoholic cirrhosis of liver with ascites: Secondary | ICD-10-CM

## 2016-10-04 DIAGNOSIS — K2970 Gastritis, unspecified, without bleeding: Secondary | ICD-10-CM | POA: Diagnosis not present

## 2016-10-04 DIAGNOSIS — J392 Other diseases of pharynx: Secondary | ICD-10-CM

## 2016-10-04 DIAGNOSIS — K219 Gastro-esophageal reflux disease without esophagitis: Secondary | ICD-10-CM | POA: Insufficient documentation

## 2016-10-04 DIAGNOSIS — Z79899 Other long term (current) drug therapy: Secondary | ICD-10-CM | POA: Diagnosis not present

## 2016-10-04 DIAGNOSIS — D649 Anemia, unspecified: Secondary | ICD-10-CM

## 2016-10-04 DIAGNOSIS — K635 Polyp of colon: Secondary | ICD-10-CM

## 2016-10-04 DIAGNOSIS — I110 Hypertensive heart disease with heart failure: Secondary | ICD-10-CM | POA: Diagnosis not present

## 2016-10-04 DIAGNOSIS — R221 Localized swelling, mass and lump, neck: Secondary | ICD-10-CM

## 2016-10-04 DIAGNOSIS — F17210 Nicotine dependence, cigarettes, uncomplicated: Secondary | ICD-10-CM | POA: Diagnosis not present

## 2016-10-04 DIAGNOSIS — Z7982 Long term (current) use of aspirin: Secondary | ICD-10-CM | POA: Diagnosis not present

## 2016-10-04 DIAGNOSIS — E119 Type 2 diabetes mellitus without complications: Secondary | ICD-10-CM | POA: Insufficient documentation

## 2016-10-04 DIAGNOSIS — C44529 Squamous cell carcinoma of skin of other part of trunk: Secondary | ICD-10-CM

## 2016-10-04 HISTORY — PX: ESOPHAGOGASTRODUODENOSCOPY (EGD) WITH PROPOFOL: SHX5813

## 2016-10-04 HISTORY — PX: COLONOSCOPY WITH PROPOFOL: SHX5780

## 2016-10-04 LAB — GLUCOSE, CAPILLARY: Glucose-Capillary: 126 mg/dL — ABNORMAL HIGH (ref 65–99)

## 2016-10-04 SURGERY — ESOPHAGOGASTRODUODENOSCOPY (EGD) WITH PROPOFOL
Anesthesia: General

## 2016-10-04 MED ORDER — LIDOCAINE HCL (PF) 2 % IJ SOLN
INTRAMUSCULAR | Status: AC
  Filled 2016-10-04: qty 2

## 2016-10-04 MED ORDER — MIDAZOLAM HCL 2 MG/2ML IJ SOLN
INTRAMUSCULAR | Status: AC
  Filled 2016-10-04: qty 2

## 2016-10-04 MED ORDER — GLYCOPYRROLATE 0.2 MG/ML IJ SOLN
INTRAMUSCULAR | Status: AC
Start: 2016-10-04 — End: ?
  Filled 2016-10-04: qty 1

## 2016-10-04 MED ORDER — LIDOCAINE HCL (CARDIAC) 20 MG/ML IV SOLN
INTRAVENOUS | Status: DC | PRN
  Administered 2016-10-04: 40 mg via INTRAVENOUS

## 2016-10-04 MED ORDER — SODIUM CHLORIDE 0.9 % IV SOLN
INTRAVENOUS | Status: DC
Start: 2016-10-04 — End: 2016-10-04
  Administered 2016-10-04: 08:00:00 via INTRAVENOUS

## 2016-10-04 MED ORDER — PROPOFOL 500 MG/50ML IV EMUL
INTRAVENOUS | Status: AC
  Filled 2016-10-04: qty 50

## 2016-10-04 MED ORDER — SUCCINYLCHOLINE CHLORIDE 20 MG/ML IJ SOLN
INTRAMUSCULAR | Status: AC
  Filled 2016-10-04: qty 1

## 2016-10-04 MED ORDER — PROPOFOL 500 MG/50ML IV EMUL
INTRAVENOUS | Status: DC | PRN
  Administered 2016-10-04: 150 ug/kg/min via INTRAVENOUS

## 2016-10-04 MED ORDER — PROPOFOL 10 MG/ML IV BOLUS
INTRAVENOUS | Status: DC | PRN
  Administered 2016-10-04: 60 mg via INTRAVENOUS
  Administered 2016-10-04 (×2): 20 mg via INTRAVENOUS

## 2016-10-04 NOTE — Op Note (Signed)
[facility] Gastroenterology Patient Name: [name] Procedure Date: 10/04/2016 7:52 AM MRN: [id number] Account #: [id number] Date of Birth: [birth year] Admit Type: Outpatient Age: 66 Room: [facility] ENDO ROOM 4 Gender: Male Note Status: Finalized Procedure:            Colonoscopy Indications:          Iron deficiency anemia Providers:            Jonathon Bellows MD, MD Referring MD:         No Local Md, MD (Referring MD) Medicines:            Monitored Anesthesia Care Complications:        No immediate complications. Procedure:            Pre-Anesthesia Assessment:                       - Prior to the procedure, a History and Physical was                        performed, and patient medications, allergies and                        sensitivities were reviewed. The patient's tolerance of                        previous anesthesia was reviewed.                       - The risks and benefits of the procedure and the                        sedation options and risks were discussed with the                        patient. All questions were answered and informed                        consent was obtained.                       - ASA Grade Assessment: III - A patient with severe                        systemic disease.                       After obtaining informed consent, the colonoscope was                        passed under direct vision. Throughout the procedure,                        the patient's blood pressure, pulse, and oxygen                        saturations were monitored continuously. The                        Colonoscope was introduced through the anus and  advanced to the the cecum, identified by the                        appendiceal orifice, IC valve and transillumination.                        The colonoscopy was performed with ease. The patient                        tolerated the procedure well. The quality of the bowel                  preparation was adequate. Findings:      The appendix appeared protruding , normal overlying mucosa, orifice       plugged with stool. No mucus seen in the vicinity . Appendix are was       about 15 mm in size protruding into the lumen . Marland Kitchen      A 6 mm polyp was found in the cecum. The polyp was sessile. The polyp       was removed with a cold snare. Resection and retrieval were complete.      The exam was otherwise without abnormality on direct and retroflexion       views. Impression:           - Rule out malignancy, polypoid lesion at the                        appendiceal orifice.                       - One 6 mm polyp in the cecum, removed with a cold                        snare. Resected and retrieved.                       - The examination was otherwise normal on direct and                        retroflexion views. Recommendation:       - Discharge patient to home (with escort).                       - Resume previous diet.                       - Continue present medications.                       - Await pathology results.                       - Return to my office in 3 weeks. Procedure Code(s):    --- Professional ---                       [phone], Colonoscopy, flexible; with removal of tumor(s),                        polyp(s), or other lesion(s) by snare technique Diagnosis Code(s):    --- Professional ---  D49.0, Neoplasm of unspecified behavior of digestive                        system                       D12.0, Benign neoplasm of cecum                       D50.9, Iron deficiency anemia, unspecified CPT copyright 2016 American Medical Association. All rights reserved. The codes documented in this report are preliminary and upon coder review may  be revised to meet current compliance requirements. Jonathon Bellows, MD Jonathon Bellows MD, MD 10/04/2016 8:43:48 AM This report has been signed electronically. Number of Addenda: 0 Note  Initiated On: 10/04/2016 7:52 AM Scope Withdrawal Time: 0 hours 10 minutes 16 seconds  Total Procedure Duration: 0 hours 23 minutes 51 seconds       [facility]

## 2016-10-04 NOTE — Transfer of Care (Signed)
Immediate Anesthesia Transfer of Care Note  Patient: [name]  Procedure(s) Performed: Procedure(s): ESOPHAGOGASTRODUODENOSCOPY (EGD) WITH PROPOFOL (N/A) COLONOSCOPY WITH PROPOFOL (N/A)  Patient Location: PACU and Endoscopy Unit  Anesthesia Type:General  Level of Consciousness: sedated  Airway & Oxygen Therapy: Patient Spontanous Breathing and Patient connected to nasal cannula oxygen  Post-op Assessment: Report given to RN and Post -op Vital signs reviewed and stable  Post vital signs: Reviewed and stable  Last Vitals:  Vitals:   10/04/16 0724 10/04/16 0843  BP: 129/71 96/64  Pulse: 75 (!) 58  Resp: 16 14  Temp: 36.1 C (!) 88.3 C    Complications: No apparent anesthesia complications

## 2016-10-04 NOTE — Anesthesia Preprocedure Evaluation (Signed)
Anesthesia Evaluation  Patient identified by MRN, date of birth, ID band Patient awake    Reviewed: Allergy & Precautions, H&P , NPO status , Patient's Chart, lab work & pertinent test results, reviewed documented beta blocker date and time   Airway Mallampati: II   Neck ROM: full    Dental  (+) Poor Dentition   Pulmonary neg pulmonary ROS, shortness of breath and with exertion, Current Smoker,    Pulmonary exam normal        Cardiovascular Exercise Tolerance: Poor hypertension, On Medications + Peripheral Vascular Disease and +CHF  (-) CAD, (-) Past MI and (-) Orthopnea negative cardio ROS Normal cardiovascular exam Rhythm:regular Rate:Normal     Neuro/Psych  Neuromuscular disease negative neurological ROS  negative psych ROS   GI/Hepatic negative GI ROS, Neg liver ROS, GERD  Medicated,  Endo/Other  negative endocrine ROSdiabetes  Renal/GU negative Renal ROS  negative genitourinary   Musculoskeletal   Abdominal   Peds  Hematology negative hematology ROS (+) anemia ,   Anesthesia Other Findings Past Medical History: No date: CHF (congestive heart failure) (HCC) No date: Diabetes mellitus     Comment: Patient take Metformin. 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 Past Surgical History: No date: APPENDECTOMY 11/19/2014: COLONOSCOPY WITH PROPOFOL N/A     Comment: Procedure: COLONOSCOPY WITH PROPOFOL;                Surgeon: Lucilla Lame, MD;  Location: [place];  Service: Endoscopy;  Laterality:              N/A;  DIABETIC-ORAL MEDS 01/26/2016: INGUINAL LYMPH NODE BIOPSY Right  Comment: Procedure: INGUINAL LYMPH NODE BIOPSY;                Surgeon: Clayburn Pert, MD;  Location: [facility]               ORS;  Service: General;  Laterality: Right; 11/02/2014: RECTAL BIOPSY N/A     Comment: Procedure: BIOPSY RECTAL;  Surgeon:               Marlyce Huge, MD;  Location: [facility] ORS;              Service: General;  Laterality: N/A; 11/02/2014: RECTAL EXAM UNDER ANESTHESIA N/A     Comment: Procedure: RECTAL EXAM UNDER ANESTHESIA;                Surgeon: Marlyce Huge, MD;  Location:               [facility] ORS;  Service: General;  Laterality: N/A; No date: TONSILLECTOMY BMI    Body Mass Index:  24.37 kg/m     Reproductive/Obstetrics negative OB ROS                             Anesthesia Physical Anesthesia Plan  ASA: III  Anesthesia Plan: General   Post-op Pain Management:    Induction:   PONV Risk Score and Plan:   Airway Management Planned:   Additional Equipment:   Intra-op Plan:   Post-operative  Plan:   Informed Consent: I have reviewed the patients History and Physical, chart, labs and discussed the procedure including the risks, benefits and alternatives for the proposed anesthesia with the patient or authorized representative who has indicated his/her understanding and acceptance.   Dental Advisory Given  Plan Discussed with: CRNA  Anesthesia Plan Comments:         Anesthesia Quick Evaluation

## 2016-10-04 NOTE — Anesthesia Procedure Notes (Signed)
Date/Time: 10/04/2016 7:58 AM Performed by: Doreen Salvage Pre-anesthesia Checklist: Patient identified, Emergency Drugs available, Suction available and Patient being monitored Patient Re-evaluated:Patient Re-evaluated prior to inductionOxygen Delivery Method: Nasal cannula Intubation Type: IV induction Dental Injury: Teeth and Oropharynx as per pre-operative assessment  Comments: Nasal cannula with etCO2 monitoring

## 2016-10-04 NOTE — H&P (Signed)
Jonathon Bellows MD [address], [place], [place] [address] Phone: [phone] Fax : [phone]  Primary Care Physician:  McLean-Scocuzza, Nino Glow, MD Primary Gastroenterologist:  Dr. Jonathon Bellows   Pre-Procedure History & Physical: HPI:  [name] is a 66 y.o. male is here for an endoscopy and colonoscopy.   Past Medical History:  Diagnosis Date  . CHF (congestive heart failure) ([place])   . Diabetes mellitus    Patient take Metformin.  Marland Kitchen GERD (gastroesophageal reflux disease)   . Hypertension   . Leg swelling   . Loose, teeth    PATIENT STATES "HAS 6 TEETH, SOME ARE LOOSE"  . Neuromuscular disorder ([place])    RIGHT HAND AND FINGERS NUMB  . Shortness of breath dyspnea   . 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 APPENDECTOMY    . COLONOSCOPY WITH PROPOFOL N/A 11/19/2014   Procedure: COLONOSCOPY WITH PROPOFOL;  Surgeon: Lucilla Lame, MD;  Location: [place];  Service: Endoscopy;  Laterality: N/A;  DIABETIC-ORAL MEDS  . INGUINAL LYMPH NODE BIOPSY Right 01/26/2016   Procedure: INGUINAL LYMPH NODE BIOPSY;  Surgeon: Clayburn Pert, MD;  Location: [facility] ORS;  Service: General;  Laterality: Right;  . RECTAL BIOPSY N/A 11/02/2014   Procedure: BIOPSY RECTAL;  Surgeon: Marlyce Huge, MD;  Location: [facility] ORS;  Service: General;  Laterality: N/A;  . RECTAL EXAM UNDER ANESTHESIA N/A 11/02/2014   Procedure: RECTAL EXAM UNDER ANESTHESIA;  Surgeon: Marlyce Huge, MD;  Location: [facility] ORS;  Service: General;  Laterality: N/A;  . TONSILLECTOMY      Prior to Admission medications   Medication Sig Start Date End Date Taking? Authorizing Provider  amLODipine (NORVASC) 10 MG tablet Take 1 tablet (10 mg total) by mouth daily. 01/26/16  Yes [name] Bow, MD  aspirin EC 81 MG tablet Take 81 mg by mouth daily.   Yes [provider]  atorvastatin (LIPITOR) 40 MG tablet Take 40 mg by mouth at  bedtime.   Yes [provider]  isosorbide mononitrate (IMDUR) 30 MG 24 hr tablet Take 30 mg by mouth daily.  08/13/16 08/13/17 Yes [provider]  lisinopril (PRINIVIL,ZESTRIL) 40 MG tablet Take 40 mg by mouth once daily. 02/24/16  Yes [provider]  metFORMIN (GLUCOPHAGE) 1000 MG tablet Take 1,000 mg by mouth 2 (two) times daily with a meal.   Yes [provider]  spironolactone (ALDACTONE) 25 MG tablet Take 25 mg by mouth daily.    Yes [provider]  torsemide (DEMADEX) 20 MG tablet Take 20 mg by mouth daily.    Yes [provider]  albuterol (PROVENTIL HFA;VENTOLIN HFA) 108 (90 Base) MCG/ACT inhaler Inhale into the lungs.    [provider]  HYDROcodone-acetaminophen (NORCO) 5-325 MG tablet Take 1 tablet by mouth every 6 (six) hours as needed for moderate pain. 03/29/16   Noreene Filbert, MD  hydrOXYzine (ATARAX/VISTARIL) 25 MG tablet Take 25 mg by mouth 3 times daily as needed. 02/24/16   [provider]  silver sulfADIAZINE (SILVADENE) 1 % cream Apply 1 application topically 2 (two) times daily. Patient not taking: Reported on 08/28/2016 03/29/16   Noreene Filbert, MD    Allergies as of 09/12/2016  . (No Known Allergies)    Family History  Problem Relation Age of Onset  . Asthma Mother     Social History   Social History  . Marital status: Divorced    Spouse  name: N/A  . Number of children: N/A  . Years of education: N/A   Occupational History  . Not on file.   Social History Main Topics  . Smoking status: Current Every Day Smoker    Packs/day: 0.25    Years: 20.00    Types: Cigarettes  . Smokeless tobacco: Never Used     Comment: pt claims no smoking x 3 d  . Alcohol use No     Comment: per pt used to drink beer daily- quit may/2018  . Drug use: Yes    Types: Marijuana     Comment: Last Use 2016 per patient  . Sexual activity: Not on file   Other Topics Concern  . Not on file   Social  History Narrative  . No narrative on file    Review of Systems: See HPI, otherwise negative ROS  Physical Exam: BP 129/71   Pulse 75   Temp 97 F (36.1 C) (Tympanic)   Resp 16   Ht 5\' 9"  (1.753 m)   Wt 165 lb (74.8 kg)   SpO2 100%   BMI 24.37 kg/m  General:   Alert,  pleasant and cooperative in NAD Head:  Normocephalic and atraumatic. Neck:  Supple; no masses or thyromegaly. Lungs:  Clear throughout to auscultation.    Heart:  Regular rate and rhythm. Abdomen:  Soft, nontender and nondistended. Normal bowel sounds, without guarding, and without rebound.   Neurologic:  Alert and  oriented x4;  grossly normal neurologically.  Impression/Plan: [name] is here for an endoscopy and colonoscopy to be performed for esophageal variceal screening and iron deficiency anemia   Risks, benefits, limitations, and alternatives regarding  endoscopy and colonoscopy have been reviewed with the patient.  Questions have been answered.  All parties agreeable.   Jonathon Bellows, MD  10/04/2016, 7:52 AM

## 2016-10-04 NOTE — Anesthesia Post-op Follow-up Note (Cosign Needed)
Anesthesia QCDR form completed.        

## 2016-10-04 NOTE — Anesthesia Postprocedure Evaluation (Signed)
Anesthesia Post Note  Patient: MARKEESE BOYAJIAN  Procedure(s) Performed: Procedure(s) (LRB): ESOPHAGOGASTRODUODENOSCOPY (EGD) WITH PROPOFOL (N/A) COLONOSCOPY WITH PROPOFOL (N/A)  Patient location during evaluation: PACU Anesthesia Type: General Level of consciousness: awake and alert Pain management: pain level controlled Vital Signs Assessment: post-procedure vital signs reviewed and stable Respiratory status: spontaneous breathing, nonlabored ventilation, respiratory function stable and patient connected to nasal cannula oxygen Cardiovascular status: blood pressure returned to baseline and stable Postop Assessment: no signs of nausea or vomiting Anesthetic complications: no     Last Vitals:  Vitals:   10/04/16 0724 10/04/16 0843  BP: 129/71 96/64  Pulse: 75 (!) 58  Resp: 16 14  Temp: 36.1 C (!) 35.8 C    Last Pain:  Vitals:   10/04/16 0843  TempSrc: Axillary                 Molli Barrows

## 2016-10-04 NOTE — Op Note (Signed)
[facility] Gastroenterology Patient Name: [name] Procedure Date: 10/04/2016 7:53 AM MRN: [id number] Account #: [id number] Date of Birth: Sep 06, 1950 Admit Type: Outpatient Age: 66 Room: [facility] ENDO ROOM 4 Gender: Male Note Status: Finalized Procedure:            Upper GI endoscopy Indications:          Iron deficiency anemia Providers:            Jonathon Bellows MD, MD Referring MD:         No Local Md, MD (Referring MD) Medicines:            Monitored Anesthesia Care Complications:        No immediate complications. Procedure:            Pre-Anesthesia Assessment:                       - Prior to the procedure, a History and Physical was                        performed, and patient medications, allergies and                        sensitivities were reviewed. The patient's tolerance of                        previous anesthesia was reviewed.                       - The risks and benefits of the procedure and the                        sedation options and risks were discussed with the                        patient. All questions were answered and informed                        consent was obtained.                       - ASA Grade Assessment: III - A patient with severe                        systemic disease.                       After obtaining informed consent, the endoscope was                        passed under direct vision. Throughout the procedure,                        the patient's blood pressure, pulse, and oxygen                        saturations were monitored continuously. The Endoscope                        was introduced through the mouth, and advanced to the  third part of duodenum. The upper GI endoscopy was                        accomplished with ease. The patient tolerated the                        procedure well. Findings:      The examined duodenum was normal.      Diffuse moderate inflammation characterized  by congestion (edema),       erosions and erythema was found in the gastric antrum and in the       prepyloric region of the stomach. Biopsies were taken with a cold       forceps for histology.      The esophagus was normal.      A large polypoid mass was found in the left pyriform sinus. The mass is       not obstructing the airway. Impression:           - Normal examined duodenum.                       - Gastritis. Biopsied.                       - Normal esophagus.                       - Large polypoid mass found in the left pyriform sinus,                        not obstructing the airway. This lesion is suspicious                        for malignancy. Recommendation:       - Await pathology results.                       - Referral to ENT ASAP                       - Perform a colonoscopy today. Procedure Code(s):    --- Professional ---                       [phone], Esophagogastroduodenoscopy, flexible, transoral;                        with biopsy, single or multiple Diagnosis Code(s):    --- Professional ---                       K29.70, Gastritis, unspecified, without bleeding                       J39.2, Other diseases of pharynx                       D50.9, Iron deficiency anemia, unspecified CPT copyright 2016 American Medical Association. All rights reserved. The codes documented in this report are preliminary and upon coder review may  be revised to meet current compliance requirements. Jonathon Bellows, MD Jonathon Bellows MD, MD 10/04/2016 8:12:03 AM This report has been signed electronically. Number of Addenda: 0 Note Initiated On: 10/04/2016 7:53 AM  [facility]

## 2016-10-05 ENCOUNTER — Encounter: Payer: Self-pay | Admitting: Gastroenterology

## 2016-10-08 ENCOUNTER — Encounter: Payer: Medicare HMO | Attending: Surgery | Admitting: Surgery

## 2016-10-08 DIAGNOSIS — J449 Chronic obstructive pulmonary disease, unspecified: Secondary | ICD-10-CM | POA: Diagnosis not present

## 2016-10-08 DIAGNOSIS — I110 Hypertensive heart disease with heart failure: Secondary | ICD-10-CM | POA: Diagnosis not present

## 2016-10-08 DIAGNOSIS — E1140 Type 2 diabetes mellitus with diabetic neuropathy, unspecified: Secondary | ICD-10-CM | POA: Diagnosis not present

## 2016-10-08 DIAGNOSIS — L97512 Non-pressure chronic ulcer of other part of right foot with fat layer exposed: Secondary | ICD-10-CM | POA: Insufficient documentation

## 2016-10-08 DIAGNOSIS — F1019 Alcohol abuse with unspecified alcohol-induced disorder: Secondary | ICD-10-CM | POA: Insufficient documentation

## 2016-10-08 DIAGNOSIS — I87313 Chronic venous hypertension (idiopathic) with ulcer of bilateral lower extremity: Secondary | ICD-10-CM | POA: Insufficient documentation

## 2016-10-08 DIAGNOSIS — Z86718 Personal history of other venous thrombosis and embolism: Secondary | ICD-10-CM | POA: Insufficient documentation

## 2016-10-08 DIAGNOSIS — Z85828 Personal history of other malignant neoplasm of skin: Secondary | ICD-10-CM | POA: Insufficient documentation

## 2016-10-08 DIAGNOSIS — E11621 Type 2 diabetes mellitus with foot ulcer: Secondary | ICD-10-CM | POA: Insufficient documentation

## 2016-10-08 DIAGNOSIS — Z7984 Long term (current) use of oral hypoglycemic drugs: Secondary | ICD-10-CM | POA: Diagnosis not present

## 2016-10-08 DIAGNOSIS — F17218 Nicotine dependence, cigarettes, with other nicotine-induced disorders: Secondary | ICD-10-CM | POA: Insufficient documentation

## 2016-10-08 DIAGNOSIS — Z9119 Patient's noncompliance with other medical treatment and regimen: Secondary | ICD-10-CM | POA: Diagnosis not present

## 2016-10-08 DIAGNOSIS — L97522 Non-pressure chronic ulcer of other part of left foot with fat layer exposed: Secondary | ICD-10-CM | POA: Insufficient documentation

## 2016-10-08 DIAGNOSIS — D649 Anemia, unspecified: Secondary | ICD-10-CM | POA: Insufficient documentation

## 2016-10-08 DIAGNOSIS — I890 Lymphedema, not elsewhere classified: Secondary | ICD-10-CM | POA: Diagnosis not present

## 2016-10-08 DIAGNOSIS — Z923 Personal history of irradiation: Secondary | ICD-10-CM | POA: Diagnosis not present

## 2016-10-08 DIAGNOSIS — I509 Heart failure, unspecified: Secondary | ICD-10-CM | POA: Diagnosis not present

## 2016-10-08 LAB — SURGICAL PATHOLOGY

## 2016-10-09 ENCOUNTER — Telehealth: Payer: Self-pay

## 2016-10-09 NOTE — Progress Notes (Signed)
MARKAS, ALDREDGE ([id number]) Visit Report for 10/08/2016 Arrival Information Details Patient Name: [name], [name] 10/08/2016 11:00 Date of Service: AM Medical Record [id number] Number: Patient Account Number: [id number] Date of Birth/Sex: [birth year] (65 y.o. Male) Treating RN: Elyn Peers, Other Clinician: Primary Care Abhiram Criado: Olivia Mackie Treating Britto, Cherie Ouch, Eydie Wormley/Extender: Referring Holland Nickson: Harless Nakayama in Treatment: 2 Visit Information History Since Last Visit Added or deleted any medications: No Patient Arrived: Ambulatory Any new allergies or adverse reactions: No Arrival Time: 10:57 Had a fall or experienced change in No Accompanied By: son activities of daily living that may affect Transfer Assistance: None risk of falls: Patient Identification Verified: Yes Signs or symptoms of abuse/neglect since last No Secondary Verification Process Yes visito Completed: Hospitalized since last visit: No Patient Has Alerts: Yes Has Dressing in Place as Prescribed: Yes Patient Alerts: DMII Pain Present Now: No Electronic Signature(s) Signed: 10/08/2016 4:56:49 PM By: Montey Hora Entered By: Montey Hora on 10/08/2016 11:06:17 [place], [name] ([id number]) -------------------------------------------------------------------------------- Clinic Level of Care Assessment Details Patient Name: [name], [name] 10/08/2016 11:00 Date of Service: AM Medical Record [id number] Number: Patient Account Number: [id number] Date of Birth/Sex: [birth year] (66 y.o. Male) Treating RN: Elyn Peers, Other Clinician: Primary Care Beyla Loney: Olivia Mackie Treating Britto, Cherie Ouch, Fread Kottke/Extender: Referring Mita Vallo: Harless Nakayama in Treatment: 2 Clinic Level of Care Assessment Items TOOL 4 Quantity Score []  - Use when only an EandM is performed on FOLLOW-UP visit 0 ASSESSMENTS - Nursing Assessment / Reassessment X - Reassessment  of Co-morbidities (includes updates in patient status) 1 10 X - Reassessment of Adherence to Treatment Plan 1 5 ASSESSMENTS - Wound and Skin Assessment / Reassessment []  - Simple Wound Assessment / Reassessment - one wound 0 X - Complex Wound Assessment / Reassessment - multiple wounds 6 5 []  - Dermatologic / Skin Assessment (not related to wound area) 0 ASSESSMENTS - Focused Assessment []  - Circumferential Edema Measurements - multi extremities 0 []  - Nutritional Assessment / Counseling / Intervention 0 X - Lower Extremity Assessment (monofilament, tuning fork, pulses) 1 5 []  - Peripheral Arterial Disease Assessment (using hand held doppler) 0 ASSESSMENTS - Ostomy and/or Continence Assessment and Care []  - Incontinence Assessment and Management 0 []  - Ostomy Care Assessment and Management (repouching, etc.) 0 PROCESS - Coordination of Care X - Simple Patient / Family Education for ongoing care 1 15 []  - Complex (extensive) Patient / Family Education for ongoing care 0 []  - Staff obtains Consents, Records, Test Results / Process Orders 0 Carton, Doyce E. ([id number]) []  - Staff telephones HHA, Nursing Homes / Clarify orders / etc 0 []  - Routine Transfer to another Facility (non-emergent condition) 0 []  - Routine Hospital Admission (non-emergent condition) 0 []  - New Admissions / Biomedical engineer / Ordering NPWT, Apligraf, etc. 0 []  - Emergency Hospital Admission (emergent condition) 0 X - Simple Discharge Coordination 1 10 []  - Complex (extensive) Discharge Coordination 0 PROCESS - Special Needs []  - Pediatric / Minor Patient Management 0 []  - Isolation Patient Management 0 []  - Hearing / Language / Visual special needs 0 []  - Assessment of Community assistance (transportation, D/C planning, etc.) 0 []  - Additional assistance / Altered mentation 0 []  - Support Surface(s) Assessment (bed, cushion, seat, etc.) 0 INTERVENTIONS - Wound Cleansing / Measurement []  - Simple Wound  Cleansing - one wound 0 X - Complex Wound Cleansing - multiple wounds 6 5 X - Wound Imaging (photographs - any number of wounds) 1 5 []  - Wound Tracing (  instead of photographs) 0 []  - Simple Wound Measurement - one wound 0 X - Complex Wound Measurement - multiple wounds 6 5 INTERVENTIONS - Wound Dressings X - Small Wound Dressing one or multiple wounds 6 10 []  - Medium Wound Dressing one or multiple wounds 0 []  - Large Wound Dressing one or multiple wounds 0 []  - Application of Medications - topical 0 []  - Application of Medications - injection 0 Haugen, Haron E. ([id number]) INTERVENTIONS - Miscellaneous []  - External ear exam 0 []  - Specimen Collection (cultures, biopsies, blood, body fluids, etc.) 0 []  - Specimen(s) / Culture(s) sent or taken to Lab for analysis 0 []  - Patient Transfer (multiple staff / Harrel Lemon Lift / Similar devices) 0 []  - Simple Staple / Suture removal (25 or less) 0 []  - Complex Staple / Suture removal (26 or more) 0 []  - Hypo / Hyperglycemic Management (close monitor of Blood Glucose) 0 []  - Ankle / Brachial Index (ABI) - do not check if billed separately 0 X - Vital Signs 1 5 Has the patient been seen at the hospital within the last three years: Yes Total Score: 205 Level Of Care: New/Established - Level 5 Electronic Signature(s) Signed: 10/08/2016 4:56:49 PM By: Montey Hora Entered By: Montey Hora on 10/08/2016 12:11:49 [place], [name] ([id number]) -------------------------------------------------------------------------------- Encounter Discharge Information Details Patient Name: [name], [name] 10/08/2016 11:00 Date of Service: AM Medical Record [id number] Number: Patient Account Number: [id number] Date of Birth/Sex: Mar 10, 1951 (66 y.o. Male) Treating RN: Elyn Peers, Other Clinician: Primary Care Ines Rebel: Olivia Mackie Treating Britto, Cherie Ouch, Kaelen Caughlin/Extender: Referring Genesis Novosad: Harless Nakayama in Treatment:  2 Encounter Discharge Information Items Discharge Pain Level: 0 Discharge Condition: Stable Ambulatory Status: Ambulatory Discharge Destination: Home Transportation: Private Auto Accompanied By: son Schedule Follow-up Appointment: Yes Medication Reconciliation completed and provided to Patient/Care No Lilton Pare: Provided on Clinical Summary of Care: 10/08/2016 Form Type Recipient Paper Patient ED Electronic Signature(s) Signed: 10/08/2016 11:32:03 AM By: Ruthine Dose Entered By: Ruthine Dose on 10/08/2016 11:32:03 [place], [name] ([id number]) -------------------------------------------------------------------------------- Lower Extremity Assessment Details Patient Name: [name], [name] 10/08/2016 11:00 Date of Service: AM Medical Record [id number] Number: Patient Account Number: [id number] Date of Birth/Sex: 06-25-1950 (66 y.o. Male) Treating RN: Elyn Peers, Other Clinician: Primary Care Mizael Sagar: Olivia Mackie Treating Britto, Cherie Ouch, Ryken Paschal/Extender: Referring Tiffine Henigan: Harless Nakayama in Treatment: 2 Vascular Assessment Pulses: Dorsalis Pedis Palpable: [Left:Yes] [Right:Yes] Posterior Tibial Extremity colors, hair growth, and conditions: Extremity Color: [Left:Hyperpigmented] [Right:Hyperpigmented] Hair Growth on Extremity: [Left:No] [Right:No] Temperature of Extremity: [Left:Warm] [Right:Warm] Capillary Refill: [Left:< 3 seconds] [Right:< 3 seconds] Electronic Signature(s) Signed: 10/08/2016 11:14:16 AM By: Montey Hora Entered By: Montey Hora on 10/08/2016 11:14:16 Ciotti, [name] ([id number]) -------------------------------------------------------------------------------- Multi Wound Chart Details Patient Name: [name] Brittle E. 10/08/2016 11:00 Date of Service: AM Medical Record [id number] Number: Patient Account Number: [id number] Date of Birth/Sex: May 19, 1950 (66 y.o. Male) Treating RN: Elyn Peers, Other  Clinician: Primary Care Xian Alves: Olivia Mackie Treating Britto, Cherie Ouch, Gaynor Ferreras/Extender: Referring Sandrika Schwinn: Harless Nakayama in Treatment: 2 Vital Signs Height(in): 69 Pulse(bpm): 68 Weight(lbs): 168 Blood Pressure 127/74 (mmHg): Body Mass Index(BMI): 25 Temperature(F): 98.3 Respiratory Rate 18 (breaths/min): Photos: [1:No Photos] [2:No Photos] [3:No Photos] Wound Location: [1:Right Toe Great] [2:Right Toe Second] [3:Right Toe Third] Wounding Event: [1:Gradually Appeared] [2:Gradually Appeared] [3:Gradually Appeared] Primary Etiology: [1:Diabetic Wound/Ulcer of the Lower Extremity] [2:Diabetic Wound/Ulcer of the Lower Extremity] [3:Diabetic Wound/Ulcer of the Lower Extremity] Comorbid History: [1:Anemia, Lymphedema, Congestive Heart Failure, Hypertension, Peripheral Venous Disease, Type II Diabetes, Neuropathy] [2:Anemia, Lymphedema,  Congestive Heart Failure, Hypertension, Peripheral Venous Disease, Type II Diabetes,  Neuropathy] [3:Anemia, Lymphedema, Congestive Heart Failure, Hypertension, Peripheral Venous Disease, Type II Diabetes, Neuropathy] Date Acquired: [1:06/11/2016] [2:06/11/2016] [3:06/11/2016] Weeks of Treatment: [1:2] [2:2] [3:2] Wound Status: [1:Open] [2:Open] [3:Open] Pending Amputation on Yes [2:Yes] [3:Yes] Presentation: Measurements L x W x D 8.3x7x0.1 [2:1.1x2x0.1] [3:0.1x0.1x0.1] (cm) Area (cm) : [1:45.632] [2:1.728] [3:0.008] Volume (cm) : [1:4.563] [2:0.173] [3:0.001] % Reduction in Area: [1:3.20%] [2:43.70%] [3:98.40%] % Reduction in Volume: 3.20% [2:43.60%] [3:98.00%] Classification: [1:Grade 1] [2:Grade 1] [3:Grade 1] Exudate Amount: [1:Large] [2:Large] [3:Large] Exudate Type: [1:Serous] [2:Serous] [3:Serous] Exudate Color: [1:amber] [2:amber] [3:amber] Foul Odor After [1:Yes] [2:Yes] [3:Yes] Cleansing: Roselli, Tyreon E. ([id number]) Odor Anticipated Due to No No No Product Use: Wound Margin: Flat and Intact Flat and Intact Flat and  Intact Granulation Amount: Large (67-100%) Large (67-100%) Large (67-100%) Granulation Quality: Pink Pink Pink Necrotic Amount: Small (1-33%) Small (1-33%) Small (1-33%) Exposed Structures: Fascia: No Fascia: No Fascia: No Fat Layer (Subcutaneous Fat Layer (Subcutaneous Fat Layer (Subcutaneous Tissue) Exposed: No Tissue) Exposed: No Tissue) Exposed: No Tendon: No Tendon: No Tendon: No Muscle: No Muscle: No Muscle: No Joint: No Joint: No Joint: No Bone: No Bone: No Bone: No Epithelialization: Small (1-33%) Small (1-33%) Small (1-33%) Periwound Skin Texture: Excoriation: Yes Excoriation: Yes Excoriation: Yes Induration: No Induration: No Induration: No Callus: No Callus: No Callus: No Crepitus: No Crepitus: No Crepitus: No Rash: No Rash: No Rash: No Scarring: No Scarring: No Scarring: No Periwound Skin Maceration: Yes Maceration: Yes Maceration: Yes Moisture: Dry/Scaly: No Dry/Scaly: No Dry/Scaly: No Periwound Skin Color: Atrophie Blanche: No Atrophie Blanche: No Atrophie Blanche: No Cyanosis: No Cyanosis: No Cyanosis: No Ecchymosis: No Ecchymosis: No Ecchymosis: No Erythema: No Erythema: No Erythema: No Hemosiderin Staining: No Hemosiderin Staining: No Hemosiderin Staining: No Mottled: No Mottled: No Mottled: No Pallor: No Pallor: No Pallor: No Rubor: No Rubor: No Rubor: No Temperature: No Abnormality No Abnormality No Abnormality Tenderness on No No No Palpation: Wound Preparation: Ulcer Cleansing: Ulcer Cleansing: Ulcer Cleansing: Rinsed/Irrigated with Rinsed/Irrigated with Rinsed/Irrigated with Saline Saline Saline Topical Anesthetic Topical Anesthetic Topical Anesthetic Applied: Other: lidocaine Applied: Other: lidocaine Applied: Other: lidocaine 4% 4% 4% Wound Number: [id number] [phone]  Photos: No Photos No Photos No Photos Wound Location: Right Foot - Dorsal Left Toe Great Left Toe Second Wounding Event: Gradually Appeared Gradually Appeared  Gradually Appeared Primary 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 the Lower Extremity the Lower Extremity Comorbid History: Anemia, Lymphedema, Anemia, Lymphedema, Anemia, Lymphedema, Congestive Heart Failure, Congestive Heart Failure, Congestive Heart Failure, Hypertension, Peripheral Hypertension, Peripheral Hypertension, Peripheral Frieden, Jayston E. ([id number]) Venous Disease, Type II Venous Disease, Type II Venous Disease, Type II Diabetes, Neuropathy Diabetes, Neuropathy Diabetes, Neuropathy Date Acquired: 06/11/2016 06/11/2016 06/11/2016 Weeks of Treatment: 2 2 2  Wound Status: Open Open Open Pending Amputation on Yes Yes Yes Presentation: Measurements L x W x D 3x2.4x0.1 2.5x5x0.1 1.2x1.5x0.1 (cm) Area (cm) : 5.655 9.817 1.414 Volume (cm) : 0.565 0.982 0.141 % Reduction in Area: -27.00% 31.30% 33.80% % Reduction in Volume: -27.00% 31.30% 34.10% Classification: Grade 1 Grade 1 Grade 1 Exudate Amount: Large Large Large Exudate Type: Serous Serous Serous Exudate Color: amber amber amber Foul Odor After Yes Yes Yes Cleansing: Odor Anticipated Due to No No No Product Use: Wound Margin: Flat and Intact Flat and Intact Flat and Intact Granulation Amount: Large (67-100%) Large (67-100%) Large (67-100%) Granulation Quality: Pink Pink Pink Necrotic Amount: Small (1-33%) Small (1-33%)  Small (1-33%) Exposed Structures: Fascia: No Fascia: No Fascia: No Fat Layer (Subcutaneous Fat Layer (Subcutaneous Fat Layer (Subcutaneous Tissue) Exposed: No Tissue) Exposed: No Tissue) Exposed: No Tendon: No Tendon: No Tendon: No Muscle: No Muscle: No Muscle: No Joint: No Joint: No Joint: No Bone: No Bone: No Bone: No Epithelialization: Small (1-33%) Small (1-33%) Small (1-33%) Periwound Skin Texture: Excoriation: Yes Excoriation: Yes Excoriation: Yes Induration: No Induration: No Induration: No Callus: No Callus:  No Callus: No Crepitus: No Crepitus: No Crepitus: No Rash: No Rash: No Rash: No Scarring: No Scarring: No Scarring: No Periwound Skin Maceration: Yes Maceration: Yes Maceration: Yes Moisture: Dry/Scaly: No Dry/Scaly: No Dry/Scaly: No Periwound Skin Color: Atrophie Blanche: No Atrophie Blanche: No Atrophie Blanche: No Cyanosis: No Cyanosis: No Cyanosis: No Ecchymosis: No Ecchymosis: No Ecchymosis: No Erythema: No Erythema: No Erythema: No Hemosiderin Staining: No Hemosiderin Staining: No Hemosiderin Staining: No Mottled: No Mottled: No Mottled: No Pallor: No Pallor: No Pallor: No Rubor: No Rubor: No Rubor: No Temperature: No Abnormality No Abnormality No Abnormality Gentle, Bernard E. ([id number]) Tenderness on No No No Palpation: Wound Preparation: Ulcer Cleansing: Ulcer Cleansing: Ulcer Cleansing: Rinsed/Irrigated with Rinsed/Irrigated with Rinsed/Irrigated with Saline Saline Saline Topical Anesthetic Topical Anesthetic Topical Anesthetic Applied: Other: lidocaine Applied: Other: lidocaine Applied: Other: lidocaine 4% 4% 4% Treatment Notes Electronic Signature(s) Signed: 10/08/2016 11:22:07 AM By: Christin Fudge MD, FACS Entered By: Christin Fudge on 10/08/2016 11:22:07 [place], [name] ([id number]) -------------------------------------------------------------------------------- [place] Details Patient Name: [name], [name] 10/08/2016 11:00 Date of Service: AM Medical Record [id number] Number: Patient Account Number: [id number] Date of Birth/Sex: Sep 03, 1950 (66 y.o. Male) Treating RN: Elyn Peers, Other Clinician: Primary Care Drayke Grabel: Olivia Mackie Treating Britto, Cherie Ouch, Falicity Sheets/Extender: Referring Arlin Savona: Harless Nakayama in Treatment: 2 Active Inactive ` Abuse / Safety / Falls / Self Care Management Nursing Diagnoses: Potential for falls Goals: Patient will remain injury free related to falls Date  Initiated: 09/21/2016 Target Resolution Date: 11/30/2016 Goal Status: Active Interventions: Assess fall risk on admission and as needed Notes: ` Nutrition Nursing Diagnoses: Potential for alteratiion in Nutrition/Potential for imbalanced nutrition Goals: Patient/caregiver agrees to and verbalizes understanding of need to use nutritional supplements and/or vitamins as prescribed Date Initiated: 09/21/2016 Target Resolution Date: 11/30/2016 Goal Status: Active Interventions: Assess patient nutrition upon admission and as needed per policy Notes: ` Orientation to the [place]. ([id number]) Nursing Diagnoses: Knowledge deficit related to the wound healing center program Goals: Patient/caregiver will verbalize understanding of the [place] Date Initiated: 09/21/2016 Target Resolution Date: 11/30/2016 Goal Status: Active Interventions: Provide education on orientation to the wound center Notes: ` Wound/Skin Impairment Nursing Diagnoses: Knowledge deficit related to smoking impact on wound healing Goals: Ulcer/skin breakdown will have a volume reduction of 30% by week 4 Date Initiated: 09/21/2016 Target Resolution Date: 11/30/2016 Goal Status: Active Ulcer/skin breakdown will have a volume reduction of 50% by week 8 Date Initiated: 09/21/2016 Target Resolution Date: 11/30/2016 Goal Status: Active Ulcer/skin breakdown will have a volume reduction of 80% by week 12 Date Initiated: 09/21/2016 Target Resolution Date: 11/30/2016 Goal Status: Active Ulcer/skin breakdown will heal within 14 weeks Date Initiated: 09/21/2016 Target Resolution Date: 11/30/2016 Goal Status: Active Interventions: Assess patient/caregiver ability to obtain necessary supplies Assess patient/caregiver ability to perform ulcer/skin care regimen upon admission and as needed Assess ulceration(s) every visit Notes: Electronic Signature(s) Signed: 10/08/2016 4:56:49 PM By:  Montey Hora Entered By: Montey Hora on 10/08/2016 11:15:52 Bluestein, Andrey E. ([id number]) -------------------------------------------------------------------------------- Pain Assessment  Details Patient Name: [name], [name] 10/08/2016 11:00 Date of Service: AM Medical Record [id number] Number: Patient Account Number: [id number] Date of Birth/Sex: 1950-05-01 (66 y.o. Male) Treating RN: Elyn Peers, Other Clinician: Primary Care Hawraa Stambaugh: Olivia Mackie Treating Britto, Cherie Ouch, Yuriy Cui/Extender: Referring Anastasios Melander: Harless Nakayama in Treatment: 2 Active Problems Location of Pain Severity and Description of Pain Patient Has Paino No Site Locations Pain Management and Medication Current Pain Management: Notes Topical or injectable lidocaine is offered to patient for acute pain when surgical debridement is performed. If needed, Patient is instructed to use over the counter pain medication for the following 24-48 hours after debridement. Wound care MDs do not prescribed pain medications. Patient has chronic pain or uncontrolled pain. Patient has been instructed to make an appointment with their Primary Care Physician for pain management. Electronic Signature(s) Signed: 10/08/2016 4:56:49 PM By: Montey Hora Entered By: Montey Hora on 10/08/2016 11:06:24 Hickel, [name] ([id number]) -------------------------------------------------------------------------------- Patient/Caregiver Education Details Patient Name: [name], [name] 10/08/2016 11:00 Date of Service: AM Medical Record [id number] Number: Patient Account Number: [id number] Date of Birth/Gender: 20-Nov-1950 (66 y.o. Male) Treating RN: Elyn Peers, Other Clinician: Primary Care Physician: Olivia Mackie Treating Britto, Cherie Ouch, Physician/Extender: Referring Physician: Harless Nakayama in Treatment: 2 Education Assessment Education Provided To: Patient and  Caregiver Education Topics Provided Wound/Skin Impairment: Handouts: Other: wound care as ordered Methods: Demonstration, Explain/Verbal Responses: State content correctly Electronic Signature(s) Signed: 10/08/2016 4:56:49 PM By: Montey Hora Entered By: Montey Hora on 10/08/2016 11:17:20 [place], [name] ([id number]) -------------------------------------------------------------------------------- Wound Assessment Details Patient Name: [name] Brittle E. 10/08/2016 11:00 Date of Service: AM Medical Record [id number] Number: Patient Account Number: [id number] Date of Birth/Sex: 07/20/1950 (66 y.o. Male) Treating RN: Elyn Peers, Other Clinician: Primary Care Renzo Vincelette: Olivia Mackie Treating Britto, Cherie Ouch, Bryston Colocho/Extender: Referring Rosalyn Archambault: Harless Nakayama in Treatment: 2 Wound Status Wound Number: 1 Primary Diabetic Wound/Ulcer of the Lower Etiology: Extremity Wound Location: Right Toe Great Wound Open Wounding Event: Gradually Appeared Status: Date Acquired: 06/11/2016 Comorbid Anemia, Lymphedema, Congestive Weeks Of Treatment: 2 History: Heart Failure, Hypertension, Peripheral Clustered Wound: No Venous Disease, Type II Diabetes, Pending Amputation On Presentation Neuropathy Photos Photo Uploaded By: Montey Hora on 10/08/2016 12:02:03 Wound Measurements Length: (cm) 8.3 Width: (cm) 7 Depth: (cm) 0.1 Area: (cm) 45.632 Volume: (cm) 4.563 % Reduction in Area: 3.2% % Reduction in Volume: 3.2% Epithelialization: Small (1-33%) Tunneling: No Undermining: No Wound Description Classification: Grade 1 Foul Odor After Wound Margin: Flat and Intact Due to Product Exudate Amount: Large Slough/Fibrino Exudate Type: Serous Exudate Color: amber Cleansing: Yes Use: No Yes Wound Bed Sharpe, Trasean E. ([id number]) Granulation Amount: Large (67-100%) Exposed Structure Granulation Quality: Pink Fascia Exposed: No Necrotic Amount: Small  (1-33%) Fat Layer (Subcutaneous Tissue) Exposed: No Necrotic Quality: Adherent Slough Tendon Exposed: No Muscle Exposed: No Joint Exposed: No Bone Exposed: No Periwound Skin Texture Texture Color No Abnormalities Noted: No No Abnormalities Noted: No Callus: No Atrophie Blanche: No Crepitus: No Cyanosis: No Excoriation: Yes Ecchymosis: No Induration: No Erythema: No Rash: No Hemosiderin Staining: No Scarring: No Mottled: No Pallor: No Moisture Rubor: No No Abnormalities Noted: No Dry / Scaly: No Temperature / Pain Maceration: Yes Temperature: No Abnormality Wound Preparation Ulcer Cleansing: Rinsed/Irrigated with Saline Topical Anesthetic Applied: Other: lidocaine 4%, Treatment Notes Wound #1 (Right Toe Great) 1. Cleansed with: Clean wound with Normal Saline 4. Dressing Applied: Aquacel Ag 5. Secondary Dressing Applied Gauze and Kerlix/Conform 7. Secured with Tape Notes netting Electronic Signature(s) Signed: 10/08/2016 11:12:17 AM By: Marjory Lies,  Di Kindle Entered By: Montey Hora on 10/08/2016 11:12:16 Hara, [name] ([id number]) -------------------------------------------------------------------------------- Wound Assessment Details Patient Name: [name], [name] 10/08/2016 11:00 Date of Service: AM Medical Record [id number] Number: Patient Account Number: [id number] Date of Birth/Sex: [birth year] (66 y.o. Male) Treating RN: Elyn Peers, Other Clinician: Primary Care Lloyd Cullinan: Olivia Mackie Treating Britto, Cherie Ouch, Camey Edell/Extender: Referring Wise Fees: Harless Nakayama in Treatment: 2 Wound Status Wound Number: 2 Primary Diabetic Wound/Ulcer of the Lower Etiology: Extremity Wound Location: Right Toe Second Wound Open Wounding Event: Gradually Appeared Status: Date Acquired: 06/11/2016 Comorbid Anemia, Lymphedema, Congestive Weeks Of Treatment: 2 History: Heart Failure, Hypertension, Peripheral Clustered Wound: No Venous Disease,  Type II Diabetes, Pending Amputation On Presentation Neuropathy Photos Photo Uploaded By: Montey Hora on 10/08/2016 12:02:57 Wound Measurements Length: (cm) 1.1 Width: (cm) 2 Depth: (cm) 0.1 Area: (cm) 1.728 Volume: (cm) 0.173 % Reduction in Area: 43.7% % Reduction in Volume: 43.6% Epithelialization: Small (1-33%) Tunneling: No Undermining: No Wound Description Classification: Grade 1 Foul Odor After Wound Margin: Flat and Intact Due to Product Exudate Amount: Large Slough/Fibrino Exudate Type: Serous Exudate Color: amber Cleansing: Yes Use: No Yes Wound Bed Coston, Dillen E. ([id number]) Granulation Amount: Large (67-100%) Exposed Structure Granulation Quality: Pink Fascia Exposed: No Necrotic Amount: Small (1-33%) Fat Layer (Subcutaneous Tissue) Exposed: No Necrotic Quality: Adherent Slough Tendon Exposed: No Muscle Exposed: No Joint Exposed: No Bone Exposed: No Periwound Skin Texture Texture Color No Abnormalities Noted: No No Abnormalities Noted: No Callus: No Atrophie Blanche: No Crepitus: No Cyanosis: No Excoriation: Yes Ecchymosis: No Induration: No Erythema: No Rash: No Hemosiderin Staining: No Scarring: No Mottled: No Pallor: No Moisture Rubor: No No Abnormalities Noted: No Dry / Scaly: No Temperature / Pain Maceration: Yes Temperature: No Abnormality Wound Preparation Ulcer Cleansing: Rinsed/Irrigated with Saline Topical Anesthetic Applied: Other: lidocaine 4%, Treatment Notes Wound #2 (Right Toe Second) 1. Cleansed with: Clean wound with Normal Saline 4. Dressing Applied: Aquacel Ag 5. Secondary Dressing Applied Gauze and Kerlix/Conform 7. Secured with Tape Notes netting Electronic Signature(s) Signed: 10/08/2016 11:12:32 AM By: Montey Hora Entered By: Montey Hora on 10/08/2016 11:12:32 Biehler, [name] ([id number]) -------------------------------------------------------------------------------- Wound Assessment  Details Patient Name: [name], [name] 10/08/2016 11:00 Date of Service: AM Medical Record [id number] Number: Patient Account Number: [id number] Date of Birth/Sex: [birth year] (66 y.o. Male) Treating RN: Elyn Peers, Other Clinician: Primary Care Javana Schey: Olivia Mackie Treating Britto, Cherie Ouch, Shaniyah Wix/Extender: Referring Agapito Hanway: Harless Nakayama in Treatment: 2 Wound Status Wound Number: 3 Primary Diabetic Wound/Ulcer of the Lower Etiology: Extremity Wound Location: Right Toe Third Wound Open Wounding Event: Gradually Appeared Status: Date Acquired: 06/11/2016 Comorbid Anemia, Lymphedema, Congestive Weeks Of Treatment: 2 History: Heart Failure, Hypertension, Peripheral Clustered Wound: No Venous Disease, Type II Diabetes, Pending Amputation On Presentation Neuropathy Photos Photo Uploaded By: Montey Hora on 10/08/2016 12:02:57 Wound Measurements Length: (cm) 0.1 Width: (cm) 0.1 Depth: (cm) 0.1 Area: (cm) 0.008 Volume: (cm) 0.001 % Reduction in Area: 98.4% % Reduction in Volume: 98% Epithelialization: Small (1-33%) Tunneling: No Undermining: No Wound Description Classification: Grade 1 Foul Odor After Wound Margin: Flat and Intact Due to Product Exudate Amount: Large Slough/Fibrino Exudate Type: Serous Exudate Color: amber Cleansing: Yes Use: No Yes Wound Bed Rathmann, Yu E. ([id number]) Granulation Amount: Large (67-100%) Exposed Structure Granulation Quality: Pink Fascia Exposed: No Necrotic Amount: Small (1-33%) Fat Layer (Subcutaneous Tissue) Exposed: No Necrotic Quality: Adherent Slough Tendon Exposed: No Muscle Exposed: No Joint Exposed: No Bone Exposed: No Periwound Skin Texture Texture Color No Abnormalities Noted: No No  Abnormalities Noted: No Callus: No Atrophie Blanche: No Crepitus: No Cyanosis: No Excoriation: Yes Ecchymosis: No Induration: No Erythema: No Rash: No Hemosiderin Staining: No Scarring:  No Mottled: No Pallor: No Moisture Rubor: No No Abnormalities Noted: No Dry / Scaly: No Temperature / Pain Maceration: Yes Temperature: No Abnormality Wound Preparation Ulcer Cleansing: Rinsed/Irrigated with Saline Topical Anesthetic Applied: Other: lidocaine 4%, Treatment Notes Wound #3 (Right Toe Third) 1. Cleansed with: Clean wound with Normal Saline 4. Dressing Applied: Aquacel Ag 5. Secondary Dressing Applied Gauze and Kerlix/Conform 7. Secured with Tape Notes netting Electronic Signature(s) Signed: 10/08/2016 11:12:52 AM By: Montey Hora Entered By: Montey Hora on 10/08/2016 11:12:50 Paprocki, [name] ([id number]) -------------------------------------------------------------------------------- Wound Assessment Details Patient Name: [name], [name] 10/08/2016 11:00 Date of Service: AM Medical Record [id number] Number: Patient Account Number: [id number] Date of Birth/Sex: 1950-08-25 (66 y.o. Male) Treating RN: Elyn Peers, Other Clinician: Primary Care Shali Vesey: Olivia Mackie Treating Britto, Cherie Ouch, Jazyiah Yiu/Extender: Referring Sakeena Teall: Harless Nakayama in Treatment: 2 Wound Status Wound Number: 4 Primary Diabetic Wound/Ulcer of the Lower Etiology: Extremity Wound Location: Right Foot - Dorsal Wound Open Wounding Event: Gradually Appeared Status: Date Acquired: 06/11/2016 Comorbid Anemia, Lymphedema, Congestive Weeks Of Treatment: 2 History: Heart Failure, Hypertension, Peripheral Clustered Wound: No Venous Disease, Type II Diabetes, Pending Amputation On Presentation Neuropathy Photos Photo Uploaded By: Montey Hora on 10/08/2016 12:04:12 Wound Measurements Length: (cm) 3 Width: (cm) 2.4 Depth: (cm) 0.1 Area: (cm) 5.655 Volume: (cm) 0.565 % Reduction in Area: -27% % Reduction in Volume: -27% Epithelialization: Small (1-33%) Tunneling: No Undermining: No Wound Description Classification: Grade 1 Foul Odor  After Wound Margin: Flat and Intact Due to Product Exudate Amount: Large Slough/Fibrino Exudate Type: Serous Exudate Color: amber Cleansing: Yes Use: No Yes Wound Bed Giambalvo, Benjaman E. ([id number]) Granulation Amount: Large (67-100%) Exposed Structure Granulation Quality: Pink Fascia Exposed: No Necrotic Amount: Small (1-33%) Fat Layer (Subcutaneous Tissue) Exposed: No Necrotic Quality: Adherent Slough Tendon Exposed: No Muscle Exposed: No Joint Exposed: No Bone Exposed: No Periwound Skin Texture Texture Color No Abnormalities Noted: No No Abnormalities Noted: No Callus: No Atrophie Blanche: No Crepitus: No Cyanosis: No Excoriation: Yes Ecchymosis: No Induration: No Erythema: No Rash: No Hemosiderin Staining: No Scarring: No Mottled: No Pallor: No Moisture Rubor: No No Abnormalities Noted: No Dry / Scaly: No Temperature / Pain Maceration: Yes Temperature: No Abnormality Wound Preparation Ulcer Cleansing: Rinsed/Irrigated with Saline Topical Anesthetic Applied: Other: lidocaine 4%, Treatment Notes Wound #4 (Right, Dorsal Foot) 1. Cleansed with: Clean wound with Normal Saline 4. Dressing Applied: Aquacel Ag 5. Secondary Dressing Applied Gauze and Kerlix/Conform 7. Secured with Tape Notes netting Electronic Signature(s) Signed: 10/08/2016 11:13:16 AM By: Montey Hora Entered By: Montey Hora on 10/08/2016 11:13:16 Gilkeson, [name] ([id number]) -------------------------------------------------------------------------------- Wound Assessment Details Patient Name: [name], [name] 10/08/2016 11:00 Date of Service: AM Medical Record [id number] Number: Patient Account Number: [id number] Date of Birth/Sex: 15-Aug-1950 (66 y.o. Male) Treating RN: Elyn Peers, Other Clinician: Primary Care Solei Wubben: Olivia Mackie Treating Britto, Cherie Ouch, Cherril Hett/Extender: Referring Gumaro Brightbill: Harless Nakayama in Treatment: 2 Wound Status Wound Number:  5 Primary Diabetic Wound/Ulcer of the Lower Etiology: Extremity Wound Location: Left Toe Great Wound Open Wounding Event: Gradually Appeared Status: Date Acquired: 06/11/2016 Comorbid Anemia, Lymphedema, Congestive Weeks Of Treatment: 2 History: Heart Failure, Hypertension, Peripheral Clustered Wound: No Venous Disease, Type II Diabetes, Pending Amputation On Presentation Neuropathy Photos Photo Uploaded By: Montey Hora on 10/08/2016 12:04:47 Wound Measurements Length: (cm) 2.5 Width: (cm) 5 Depth: (cm) 0.1 Area: (cm)  9.817 Volume: (cm) 0.982 % Reduction in Area: 31.3% % Reduction in Volume: 31.3% Epithelialization: Small (1-33%) Tunneling: No Undermining: No Wound Description Classification: Grade 1 Foul Odor After Wound Margin: Flat and Intact Due to Product Exudate Amount: Large Slough/Fibrino Exudate Type: Serous Exudate Color: amber Cleansing: Yes Use: No Yes Wound Bed Gullion, Shiheem E. ([id number]) Granulation Amount: Large (67-100%) Exposed Structure Granulation Quality: Pink Fascia Exposed: No Necrotic Amount: Small (1-33%) Fat Layer (Subcutaneous Tissue) Exposed: No Necrotic Quality: Adherent Slough Tendon Exposed: No Muscle Exposed: No Joint Exposed: No Bone Exposed: No Periwound Skin Texture Texture Color No Abnormalities Noted: No No Abnormalities Noted: No Callus: No Atrophie Blanche: No Crepitus: No Cyanosis: No Excoriation: Yes Ecchymosis: No Induration: No Erythema: No Rash: No Hemosiderin Staining: No Scarring: No Mottled: No Pallor: No Moisture Rubor: No No Abnormalities Noted: No Dry / Scaly: No Temperature / Pain Maceration: Yes Temperature: No Abnormality Wound Preparation Ulcer Cleansing: Rinsed/Irrigated with Saline Topical Anesthetic Applied: Other: lidocaine 4%, Treatment Notes Wound #5 (Left Toe Great) 1. Cleansed with: Clean wound with Normal Saline 4. Dressing Applied: Aquacel Ag 5. Secondary Dressing  Applied Gauze and Kerlix/Conform 7. Secured with Tape Notes netting Electronic Signature(s) Signed: 10/08/2016 11:13:33 AM By: Montey Hora Entered By: Montey Hora on 10/08/2016 11:13:32 Nanni, [name] ([id number]) -------------------------------------------------------------------------------- Wound Assessment Details Patient Name: [name], [name] 10/08/2016 11:00 Date of Service: AM Medical Record [id number] Number: Patient Account Number: [id number] Date of Birth/Sex: 10-05-1950 (66 y.o. Male) Treating RN: Elyn Peers, Other Clinician: Primary Care Jalal Rauch: Olivia Mackie Treating Britto, Cherie Ouch, Suleima Ohlendorf/Extender: Referring Camron Monday: Harless Nakayama in Treatment: 2 Wound Status Wound Number: 6 Primary Diabetic Wound/Ulcer of the Lower Etiology: Extremity Wound Location: Left Toe Second Wound Open Wounding Event: Gradually Appeared Status: Date Acquired: 06/11/2016 Comorbid Anemia, Lymphedema, Congestive Weeks Of Treatment: 2 History: Heart Failure, Hypertension, Peripheral Clustered Wound: No Venous Disease, Type II Diabetes, Pending Amputation On Presentation Neuropathy Photos Photo Uploaded By: Montey Hora on 10/08/2016 12:05:40 Wound Measurements Length: (cm) 1.2 Width: (cm) 1.5 Depth: (cm) 0.1 Area: (cm) 1.414 Volume: (cm) 0.141 % Reduction in Area: 33.8% % Reduction in Volume: 34.1% Epithelialization: Small (1-33%) Tunneling: No Undermining: No Wound Description Classification: Grade 1 Foul Odor After Wound Margin: Flat and Intact Due to Product Exudate Amount: Large Slough/Fibrino Exudate Type: Serous Exudate Color: amber Cleansing: Yes Use: No Yes Wound Bed Verner, Havish E. ([id number]) Granulation Amount: Large (67-100%) Exposed Structure Granulation Quality: Pink Fascia Exposed: No Necrotic Amount: Small (1-33%) Fat Layer (Subcutaneous Tissue) Exposed: No Necrotic Quality: Adherent Slough Tendon Exposed:  No Muscle Exposed: No Joint Exposed: No Bone Exposed: No Periwound Skin Texture Texture Color No Abnormalities Noted: No No Abnormalities Noted: No Callus: No Atrophie Blanche: No Crepitus: No Cyanosis: No Excoriation: Yes Ecchymosis: No Induration: No Erythema: No Rash: No Hemosiderin Staining: No Scarring: No Mottled: No Pallor: No Moisture Rubor: No No Abnormalities Noted: No Dry / Scaly: No Temperature / Pain Maceration: Yes Temperature: No Abnormality Wound Preparation Ulcer Cleansing: Rinsed/Irrigated with Saline Topical Anesthetic Applied: Other: lidocaine 4%, Treatment Notes Wound #6 (Left Toe Second) 1. Cleansed with: Clean wound with Normal Saline 4. Dressing Applied: Aquacel Ag 5. Secondary Dressing Applied Gauze and Kerlix/Conform 7. Secured with Tape Notes netting Electronic Signature(s) Signed: 10/08/2016 11:13:48 AM By: Montey Hora Entered By: Montey Hora on 10/08/2016 11:13:48 Pouliot, [name] ([id number]) -------------------------------------------------------------------------------- Vitals Details Patient Name: BLADE, SCHEFF 10/08/2016 11:00 Date of Service: AM Medical Record [id number] Number: Patient Account Number: [id number] Date of Birth/Sex:  1950-12-28 (67 y.o. Male) Treating RN: Elyn Peers, Other Clinician: Primary Care Hisham Provence: TRACY Treating Britto, Cherie Ouch, Durenda Pechacek/Extender: Referring Kelilah Hebard: Harless Nakayama in Treatment: 2 Vital Signs Time Taken: 11:04 Temperature (F): 98.3 Height (in): 69 Pulse (bpm): 68 Weight (lbs): 168 Respiratory Rate (breaths/min): 18 Body Mass Index (BMI): 24.8 Blood Pressure (mmHg): 127/74 Reference Range: 80 - 120 mg / dl Electronic Signature(s) Signed: 10/08/2016 4:56:49 PM By: Montey Hora Entered By: Montey Hora on 10/08/2016 11:06:41

## 2016-10-09 NOTE — Progress Notes (Signed)
[name], [name] ([id number]) Visit Report for 10/08/2016 Chief Complaint Document Details [name] Name: [name], [name] 10/08/2016 11:00 Date of Service: AM Medical Record [id number] Number: [name] Account Number: [id number] Date of Birth/Sex: 10-Jul-1950 (66 y.o. Male) Treating RN: [name], Other Clinician: Primary Care Provider: Olivia [name] Treating [name], [name], Provider/Extender: Referring Provider: Harless [name] in Treatment: 2 Information Obtained from: [name] Chief Complaint Patients presents for treatment of an open diabetic ulcer to both feet Electronic Signature(s) Signed: 10/08/2016 11:22:17 AM By: [name] Fudge MD, [name] Entered By: [name] on 10/08/2016 11:22:17 [name], [name] ([id number]) -------------------------------------------------------------------------------- HPI Details [name] Name: [name], [name] 10/08/2016 11:00 Date of Service: AM Medical Record [id number] Number: [name] Account Number: [id number] Date of Birth/Sex: February 20, 1951 (66 y.o. Male) Treating RN: [name], Other Clinician: Primary Care Provider: Olivia [name] Treating Verlie Hellenbrand, [name], Provider/Extender: Referring Provider: Harless [name] in Treatment: 2 History of Present Illness Location: bilateral feet ulceration on [name] toes Quality: [name] reports experiencing a dull pain to affected area(s). Severity: [name] states wound are getting better Duration: [name] has had [name] wound for > 3 months prior to seeking treatment at [name] wound center Timing: Pain in wound is constant (hurts all [name] time) Context: [name] wound would happen gradually Modifying Factors: Other treatment(s) tried include:treatment for lymphedema and is seen by [name] podiatrist [name] Associated Signs and Symptoms: [name] reports having increase swelling. HPI Description: 66 year old [name] here to see as for bilateral feet ulceration to on his left first  and second toe and 2 on his right first and second toe, which she's had for about 4 months. 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 [name] is also undergoing workup by medical oncology for a intra-abdominal lymphadenopathy. Past medical history significant for CHF, diabetes mellitus, hypertension, varicose veins with lymphedema and squamous cell cancer of [name] skin of [name] buttocks. He is also status post appendectomy, inguinal lymph node biopsy, rectal biopsy and rectal examination under anesthesia. he currently smokes cigarettes about half packet a day. In March of this year he was seen by [name], for evaluation of bilateral varicose veins and besides wearing compression stockings he had recommended laser ablation of [name] right and left great saphenous veins to eleviate [name] symptoms and complications of severe superficial venous reflux disease. He also recommended lymphedema pumps for better control of his lymphedema. [name] recently has had on 08/23/2016, right greater saphenous vein ablation with [name] laser energy Earlier lower extremity venous reflux examination done on 05/08/2016 showed no DVT or SVT both lower legs but incompetence of bilateral great saphenous veins was present. A lower arterial study was also done and there was no significant right lower and left lower extremity problems based on a normal toe brachial index bilaterally and [name] ABI was 1.221 [name] left and 1.23 on [name] right. His post ablation venous duplex examination showed successful ablation of [name] right GS vein with thrombus formation 2 below [name] right saphenofemoral junction. [name] deep system was patent without evidence of thrombosis and this was done on 08/30/2016. [name] also has a squamous cell cancer of [name] skin of [name] buttock and is recently  undergone radiation therapy for this prior to excisional surgery. Addendum: regarding his x-rays done today and x-ray of [name] left foot -- IMPRESSION: No objective [name], [name] ([id number]) evidence of osteomyelitis.  There are soft tissue changes which may reflect cellulitis. X-ray of [name] right foot -- IMPRESSION:Findings compatible with cellulitis of [name] toes. No objective evidence of osteomyelitis is observed. 10/01/16 on evaluation today [name]'s wounds appeared to be doing some better. I did review [name] x-rays as well which showed no evidence of osteomyelitis although there was evidence on x-ray of cellulitis. He fortunately is not having any discomfort although he continues to have some swelling. He does not remember being on any antibiotics recently. Electronic Signature(s) Signed: 10/08/2016 11:22:35 AM By: [name] Fudge MD, [name] Entered By: [name] on 10/08/2016 11:22:35 [name], [name] ([id number]) -------------------------------------------------------------------------------- Physical Exam Details [name] Name: [name], [name] 10/08/2016 11:00 Date of Service: AM Medical Record [id number] Number: [name] Account Number: [id number] Date of Birth/Sex: 1950-07-20 (66 y.o. Male) Treating RN: [name], Other Clinician: Primary Care Provider: Olivia [name] Treating Shone Leventhal, [name], Provider/Extender: Referring Provider: Harless [name] in Treatment: 2 Constitutional . Pulse regular. Respirations normal and unlabored. Afebrile. . Eyes Nonicteric. Reactive to light. Ears, Nose, Mouth, and Throat Lips, teeth, and gums WNL.Marland Kitchen Moist mucosa without lesions. Neck supple and nontender. No palpable supraclavicular or cervical adenopathy. Normal sized without goiter. Respiratory WNL. No retractions.. Breath sounds WNL, No rubs, rales, rhonchi, or wheeze.. Cardiovascular Heart rhythm and rate regular, no murmur or gallop.. Pedal Pulses WNL. No clubbing,  cyanosis or edema. Lymphatic No adneopathy. No adenopathy. No adenopathy. Musculoskeletal Adexa without tenderness or enlargement.. Digits and nails w/o clubbing, cyanosis, infection, petechiae, ischemia, or inflammatory conditions.. Integumentary (Hair, Skin) No suspicious lesions. No crepitus or fluctuance. No peri-wound warmth or erythema. No masses.Marland Kitchen Psychiatric Judgement and insight Intact.. No evidence of depression, anxiety, or agitation.. Notes wounds on both forefoot and toes look clean with no sharp debridement required today. There is some healthy granulation tissue. Electronic Signature(s) Signed: 10/08/2016 11:23:09 AM By: [name] Fudge MD, [name] Entered By: [name] on 10/08/2016 11:23:08 [name], [name] ([id number]) -------------------------------------------------------------------------------- Physician Orders Details [name] Name: [name], [name] 10/08/2016 11:00 Date of Service: AM Medical Record [id number] Number: [name] Account Number: [id number] Date of Birth/Sex: 1950/10/23 (66 y.o. Male) Treating RN: [name], Other Clinician: Primary Care Provider: Olivia [name] Treating Tekeyah Santiago, [name], Provider/Extender: Referring Provider: Harless [name] in Treatment: 2 Verbal / Phone Orders: No Diagnosis Coding Wound Cleansing Wound #1 Right Toe Great o Cleanse wound with mild soap and water o May Shower, gently pat wound dry prior to applying new dressing. Wound #2 Right Toe Second o Cleanse wound with mild soap and water o May Shower, gently pat wound dry prior to applying new dressing. Wound #3 Right Toe Third o Cleanse wound with mild soap and water o May Shower, gently pat wound dry prior to applying new dressing. Wound #4 Right,Dorsal Foot o Cleanse wound with mild soap and water o May Shower, gently pat wound dry prior to applying new dressing. Wound #5 Left Toe Great o Cleanse wound with mild soap and  water o May Shower, gently pat wound dry prior to applying new dressing. Wound #6 Left Toe Second o Cleanse wound with mild soap and water o May Shower, gently pat wound dry prior to applying new dressing. Anesthetic Wound #1 Right Toe Great o Topical Lidocaine 4% cream applied to wound bed prior to debridement Wound #2 Right Toe Second o Topical Lidocaine 4% cream applied to wound bed prior to debridement Wound #3 Right Toe Third o Topical Lidocaine 4% cream applied to wound bed prior to debridement [name], [name] ([id number])  Wound #4 Right,Dorsal Foot o Topical Lidocaine 4% cream applied to wound bed prior to debridement Wound #5 Left Toe Great o Topical Lidocaine 4% cream applied to wound bed prior to debridement Wound #6 Left Toe Second o Topical Lidocaine 4% cream applied to wound bed prior to debridement Primary Wound Dressing Wound #1 Right Toe Great o Aquacel Ag Wound #2 Right Toe Second o Aquacel Ag Wound #3 Right Toe Third o Aquacel Ag Wound #4 Right,Dorsal Foot o Aquacel Ag Wound #5 Left Toe Great o Aquacel Ag Wound #6 Left Toe Second o Aquacel Ag Secondary Dressing Wound #1 Right Toe Great o Gauze and Kerlix/Conform Wound #2 Right Toe Second o Gauze and Kerlix/Conform Wound #3 Right Toe Third o Gauze and Kerlix/Conform Wound #4 Right,Dorsal Foot o Gauze and Kerlix/Conform Wound #5 Left Toe Great o Gauze and Kerlix/Conform Wound #6 Left Toe Second o Gauze and Kerlix/Conform [name], [name] ([id number]) Dressing Change Frequency Wound #1 Right Toe Great o Change dressing every day. Wound #2 Right Toe Second o Change dressing every day. Wound #3 Right Toe Third o Change dressing every day. Wound #4 Right,Dorsal Foot o Change dressing every day. Wound #5 Left Toe Great o Change dressing every day. Wound #6 Left Toe Second o Change dressing every day. Follow-up Appointments Wound #1 Right Toe  Great o Return Appointment in 1 week. Wound #2 Right Toe Second o Return Appointment in 1 week. Wound #3 Right Toe Third o Return Appointment in 1 week. Wound #4 Right,Dorsal Foot o Return Appointment in 1 week. Wound #5 Left Toe Great o Return Appointment in 1 week. Wound #6 Left Toe Second o Return Appointment in 1 week. Edema Control Wound #1 Right Toe Great o [name] to wear own compression stockings o Elevate legs to [name] level of [name] heart and pump ankles as often as possible Wound #2 Right Toe Second o [name] to wear own compression stockings o Elevate legs to [name] level of [name] heart and pump ankles as often as possible Wound #3 Right Toe Third o [name] to wear own compression stockings [name], [name] ([id number]) o Elevate legs to [name] level of [name] heart and pump ankles as often as possible Wound #4 Right,Dorsal Foot o [name] to wear own compression stockings o Elevate legs to [name] level of [name] heart and pump ankles as often as possible Wound #5 Left Toe Great o [name] to wear own compression stockings o Elevate legs to [name] level of [name] heart and pump ankles as often as possible Wound #6 Left Toe Second o [name] to wear own compression stockings o Elevate legs to [name] level of [name] heart and pump ankles as often as possible Additional Orders / Instructions Wound #1 Right Toe Great o Stop Smoking - Please continue to stay away from alcohol o Other: - Please add vitamin A, vitamin C, and Zinc supplements to your diet Wound #2 Right Toe Second o Stop Smoking - Please continue to stay away from alcohol o Other: - Please add vitamin A, vitamin C, and Zinc supplements to your diet Wound #3 Right Toe Third o Stop Smoking - Please continue to stay away from alcohol o Other: - Please add vitamin A, vitamin C, and Zinc supplements to your diet Wound #4 Right,Dorsal Foot o Stop Smoking - Please continue to stay away from  alcohol o Other: - Please add vitamin A, vitamin C, and Zinc supplements to your diet Wound #5 Left Toe Great o Stop Smoking - Please  continue to stay away from alcohol o Other: - Please add vitamin A, vitamin C, and Zinc supplements to your diet Wound #6 Left Toe Second o Stop Smoking - Please continue to stay away from alcohol o Other: - Please add vitamin A, vitamin C, and Zinc supplements to your diet Home Health Wound #1 Right Toe [place] Nurse may visit PRN to address patientos wound care needs. o FACE TO FACE ENCOUNTER: MEDICARE and MEDICAID PATIENTS: I certify that this [name] is under my care and that I had a face-to-face encounter that meets [name] physician face-to-face encounter requirements with this [name] on this date. [name] encounter with [name] was in whole or in part for [name] following MEDICAL CONDITION: (primary reason for [place]) MEDICAL NECESSITY: I certify, that based on my findings, NURSING services are a medically Estey, KAILAND SEDA. ([id number]) necessary home health service. HOME BOUND STATUS: I certify that my clinical findings support that this [name] is homebound (i.e., Due to illness or injury, pt requires aid of supportive devices such as crutches, cane, wheelchairs, walkers, [name] use of special transportation or [name] assistance of another person to leave their place of residence. There is a normal inability to leave [name] home and doing so requires considerable and taxing effort. Other absences are for medical reasons / religious services and are infrequent or of short duration when for other reasons). o If current dressing causes regression in wound condition, may D/C ordered dressing product/s and apply Normal Saline Moist Dressing daily until next [place] / Other MD appointment. [place] of regression in wound condition at [phone]. o Please direct any  NON-WOUND related issues/requests for orders to [name]'s Primary Care Physician Wound #2 Right Toe Second o [place] Nurse may visit PRN to address patientos wound care needs. o FACE TO FACE ENCOUNTER: MEDICARE and MEDICAID PATIENTS: I certify that this [name] is under my care and that I had a face-to-face encounter that meets [name] physician face-to-face encounter requirements with this [name] on this date. [name] encounter with [name] was in whole or in part for [name] following MEDICAL CONDITION: (primary reason for [place]) MEDICAL NECESSITY: I certify, that based on my findings, NURSING services are a medically necessary home health service. HOME BOUND STATUS: I certify that my clinical findings support that this [name] is homebound (i.e., Due to illness or injury, pt requires aid of supportive devices such as crutches, cane, wheelchairs, walkers, [name] use of special transportation or [name] assistance of another person to leave their place of residence. There is a normal inability to leave [name] home and doing so requires considerable and taxing effort. Other absences are for medical reasons / religious services and are infrequent or of short duration when for other reasons). o If current dressing causes regression in wound condition, may D/C ordered dressing product/s and apply Normal Saline Moist Dressing daily until next [place] / Other MD appointment. [place] of regression in wound condition at [phone]. o Please direct any NON-WOUND related issues/requests for orders to [name]'s Primary Care Physician Wound #3 Right Toe [place] Nurse may visit PRN to address patientos wound care needs. o FACE TO FACE ENCOUNTER: MEDICARE and MEDICAID PATIENTS: I certify that this [name] is under my care and that I had a face-to-face encounter that meets [name] physician  face-to-face encounter requirements with  this [name] on this date. [name] encounter with [name] was in whole or in part for [name] following MEDICAL CONDITION: (primary reason for [place]) MEDICAL NECESSITY: I certify, that based on my findings, NURSING services are a medically necessary home health service. HOME BOUND STATUS: I certify that my clinical findings support that this [name] is homebound (i.e., Due to illness or injury, pt requires aid of supportive devices such as crutches, cane, wheelchairs, walkers, [name] use of special transportation or [name] assistance of another person to leave their place of residence. There is a normal inability to leave [name] home and doing so requires considerable and taxing effort. Other absences are for medical reasons / religious services and are infrequent or of short duration when for other reasons). [name], [name] ([id number]) o If current dressing causes regression in wound condition, may D/C ordered dressing product/s and apply Normal Saline Moist Dressing daily until next [place] / Other MD appointment. [place] of regression in wound condition at [phone]. o Please direct any NON-WOUND related issues/requests for orders to [name]'s Primary Care Physician Wound #4 [place] Nurse may visit PRN to address patientos wound care needs. o FACE TO FACE ENCOUNTER: MEDICARE and MEDICAID PATIENTS: I certify that this [name] is under my care and that I had a face-to-face encounter that meets [name] physician face-to-face encounter requirements with this [name] on this date. [name] encounter with [name] was in whole or in part for [name] following MEDICAL CONDITION: (primary reason for [place]) MEDICAL NECESSITY: I certify, that based on my findings, NURSING services are a medically necessary home health service. HOME BOUND STATUS: I certify that my  clinical findings support that this [name] is homebound (i.e., Due to illness or injury, pt requires aid of supportive devices such as crutches, cane, wheelchairs, walkers, [name] use of special transportation or [name] assistance of another person to leave their place of residence. There is a normal inability to leave [name] home and doing so requires considerable and taxing effort. Other absences are for medical reasons / religious services and are infrequent or of short duration when for other reasons). o If current dressing causes regression in wound condition, may D/C ordered dressing product/s and apply Normal Saline Moist Dressing daily until next [place] / Other MD appointment. [place] of regression in wound condition at [phone]. o Please direct any NON-WOUND related issues/requests for orders to [name]'s Primary Care Physician Wound #5 Left Toe [place] Nurse may visit PRN to address patientos wound care needs. o FACE TO FACE ENCOUNTER: MEDICARE and MEDICAID PATIENTS: I certify that this [name] is under my care and that I had a face-to-face encounter that meets [name] physician face-to-face encounter requirements with this [name] on this date. [name] encounter with [name] was in whole or in part for [name] following MEDICAL CONDITION: (primary reason for [place]) MEDICAL NECESSITY: I certify, that based on my findings, NURSING services are a medically necessary home health service. HOME BOUND STATUS: I certify that my clinical findings support that this [name] is homebound (i.e., Due to illness or injury, pt requires aid of supportive devices such as crutches, cane, wheelchairs, walkers, [name] use of special transportation or [name] assistance of another person to leave their place of residence. There is a normal inability to leave [name] home and doing so requires considerable and taxing effort.  Other absences are for medical reasons / religious services and are infrequent or of short duration when for other reasons). o If current dressing causes regression in wound condition, may D/C ordered dressing product/s and apply Normal Saline Moist Dressing daily until next [place] / Other MD appointment. [place] of regression in wound condition at [phone]. o Please direct any NON-WOUND related issues/requests for orders to [name]'s Primary Care Physician Wound #6 Left Toe Second o [place] Visits [name], [name] ([id number]) o [place] Nurse may visit PRN to address patientos wound care needs. o FACE TO FACE ENCOUNTER: MEDICARE and MEDICAID PATIENTS: I certify that this [name] is under my care and that I had a face-to-face encounter that meets [name] physician face-to-face encounter requirements with this [name] on this date. [name] encounter with [name] was in whole or in part for [name] following MEDICAL CONDITION: (primary reason for [place]) MEDICAL NECESSITY: I certify, that based on my findings, NURSING services are a medically necessary home health service. HOME BOUND STATUS: I certify that my clinical findings support that this [name] is homebound (i.e., Due to illness or injury, pt requires aid of supportive devices such as crutches, cane, wheelchairs, walkers, [name] use of special transportation or [name] assistance of another person to leave their place of residence. There is a normal inability to leave [name] home and doing so requires considerable and taxing effort. Other absences are for medical reasons / religious services and are infrequent or of short duration when for other reasons). o If current dressing causes regression in wound condition, may D/C ordered dressing product/s and apply Normal Saline Moist Dressing daily until next [place] / Other MD appointment. [place] of  regression in wound condition at [phone]. o Please direct any NON-WOUND related issues/requests for orders to [name]'s Primary Care Physician Electronic Signature(s) Signed: 10/08/2016 11:32:28 AM By: [name] Fudge MD, [name] Signed: 10/08/2016 4:56:49 PM By: Montey Hora Entered By: Montey Hora on 10/08/2016 11:18:19 Helbert, [name] ([id number]) -------------------------------------------------------------------------------- Problem List Details [name] Name: RAYLAND, HAMED 10/08/2016 11:00 Date of Service: AM Medical Record [id number] Number: [name] Account Number: [id number] Date of Birth/Sex: 1951-03-13 (66 y.o. Male) Treating RN: [name], Other Clinician: Primary Care Provider: Olivia [name] Treating Timaya Bojarski, [name], Provider/Extender: Referring Provider: Harless [name] in Treatment: 2 Active Problems ICD-10 Encounter Code Description Active Date Diagnosis E11.621 Type 2 diabetes mellitus with foot ulcer 09/21/2016 Yes I87.313 Chronic venous hypertension (idiopathic) with ulcer of 09/21/2016 Yes bilateral lower extremity I89.0 Lymphedema, not elsewhere classified 09/21/2016 Yes L97.522 Non-pressure chronic ulcer of other part of left foot with fat 09/21/2016 Yes layer exposed L97.512 Non-pressure chronic ulcer of other part of right foot with 09/21/2016 Yes fat layer exposed F17.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 10/08/2016 11:00 Date of Service: AM Medical  Record [id number] Number: [name] Account Number: [id number] Date of Birth/Sex: September 28, 1950 (66 y.o. Male) Treating RN: [name], Other Clinician: Primary Care Provider: Olivia [name] Treating Kawehi Hostetter, [name], Provider/Extender: Referring Provider: Harless [name] in Treatment: 2 Subjective Chief Complaint Information obtained from [name] Patients presents for treatment of an open diabetic ulcer to both feet History of  Present Illness (HPI) [name] following HPI elements were documented for [name]'s wound: Location: bilateral feet ulceration on [name] toes Quality: [name] reports experiencing a dull pain to affected area(s). Severity: [name] states wound are getting better Duration: [name] has had [name] wound for > 3 months prior to seeking treatment at [name] wound center Timing: Pain in wound is constant (hurts all [name] time) Context: [name] wound would happen gradually Modifying Factors: Other treatment(s) tried include:treatment for lymphedema and is seen by [name] podiatrist [name] Associated Signs and Symptoms: [name] reports having increase swelling. 66 year old [name] here to see as for bilateral feet ulceration to on his left first and second toe and 2 on his right first and second toe, which she's had for about 4 months. 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 [name] is also undergoing workup by medical oncology for a intra- abdominal lymphadenopathy. Past medical history significant for CHF, diabetes mellitus, hypertension, varicose veins with lymphedema and squamous cell cancer of [name] skin of [name] buttocks. He is also status post appendectomy, inguinal lymph node biopsy, rectal biopsy and rectal examination under anesthesia. he currently smokes cigarettes about half packet a day. In March of this year he  was seen by [name], for evaluation of bilateral varicose veins and besides wearing compression stockings he had recommended laser ablation of [name] right and left great saphenous veins to eleviate [name] symptoms and complications of severe superficial venous reflux disease. He also recommended lymphedema pumps for better control of his lymphedema. [name] recently has had on 08/23/2016, right greater saphenous vein ablation with [name] laser energy Earlier lower extremity venous reflux examination done on 05/08/2016 showed no DVT or SVT both lower legs but incompetence of bilateral great saphenous veins was present. A lower arterial study was also done and there was no significant right lower and left lower extremity problems based on a normal toe brachial index bilaterally and [name] ABI was 1.221 [name] left and 1.23 on [name] Bowell, Tomi E. ([id number]) right. His post ablation venous duplex examination showed successful ablation of [name] right GS vein with thrombus formation 2 below [name] right saphenofemoral junction. [name] deep system was patent without evidence of thrombosis and this was done on 08/30/2016. [name] also has a squamous cell cancer of [name] skin of [name] buttock and is recently undergone radiation therapy for this prior to excisional surgery. Addendum: regarding his x-rays done today and x-ray of [name] left foot -- IMPRESSION: No objective evidence of osteomyelitis. There are soft tissue changes which may reflect cellulitis. X-ray of [name] right foot -- IMPRESSION:Findings compatible with cellulitis of [name] toes. No objective evidence of osteomyelitis is observed. 10/01/16 on evaluation today [name]'s wounds appeared to be doing some better. I did review [name] x-rays as well which showed no evidence of osteomyelitis although there was evidence on x-ray of cellulitis. He fortunately is not having any discomfort although he continues to have some swelling. He does not remember being on  any antibiotics recently. Objective Constitutional Pulse regular. Respirations normal and unlabored. Afebrile. Vitals Time Taken: 11:04 AM, Height: 69 in, Weight: 168 lbs, BMI: 24.8, Temperature: 98.3 F, Pulse: 68 bpm, Respiratory Rate: 18 breaths/min, Blood Pressure: 127/74 mmHg. Eyes Nonicteric. Reactive to light. Ears, Nose, Mouth, and Throat Lips, teeth, and gums WNL.Marland Kitchen Moist mucosa without lesions. Neck supple and nontender. No  palpable supraclavicular or cervical adenopathy. Normal sized without goiter. Respiratory WNL. No retractions.. Breath sounds WNL, No rubs, rales, rhonchi, or wheeze.. Cardiovascular Heart rhythm and rate regular, no murmur or gallop.. Pedal Pulses WNL. No clubbing, cyanosis or edema. Lymphatic Demchak, Chon E. ([id number]) No adneopathy. No adenopathy. No adenopathy. Musculoskeletal Adexa without tenderness or enlargement.. Digits and nails w/o clubbing, cyanosis, infection, petechiae, ischemia, or inflammatory conditions.Marland Kitchen Psychiatric Judgement and insight Intact.. No evidence of depression, anxiety, or agitation.. General Notes: wounds on both forefoot and toes look clean with no sharp debridement required today. There is some healthy granulation tissue. Integumentary (Hair, Skin) No suspicious lesions. No crepitus or fluctuance. No peri-wound warmth or erythema. No masses.. Wound #1 status is Open. Original cause of wound was Gradually Appeared. [name] wound is located on [name] Right Toe Great. [name] wound measures 8.3cm length x 7cm width x 0.1cm depth; 45.632cm^2 area and 4.563cm^3 volume. There is no tunneling or undermining noted. There is a large amount of serous drainage noted. [name] wound margin is flat and intact. There is large (67-100%) pink granulation within [name] wound bed. There is a small (1-33%) amount of necrotic tissue within [name] wound bed including Adherent Slough. [name] periwound skin appearance exhibited: Excoriation, Maceration. [name] periwound  skin appearance did not exhibit: Callus, Crepitus, Induration, Rash, Scarring, Dry/Scaly, Atrophie Blanche, Cyanosis, Ecchymosis, Hemosiderin Staining, Mottled, Pallor, Rubor, Erythema. Periwound temperature was noted as No Abnormality. Wound #2 status is Open. Original cause of wound was Gradually Appeared. [name] wound is located on [name] Right Toe Second. [name] wound measures 1.1cm length x 2cm width x 0.1cm depth; 1.728cm^2 area and 0.173cm^3 volume. There is no tunneling or undermining noted. There is a large amount of serous drainage noted. [name] wound margin is flat and intact. There is large (67-100%) pink granulation within [name] wound bed. There is a small (1-33%) amount of necrotic tissue within [name] wound bed including Adherent Slough. [name] periwound skin appearance exhibited: Excoriation, Maceration. [name] periwound skin appearance did not exhibit: Callus, Crepitus, Induration, Rash, Scarring, Dry/Scaly, Atrophie Blanche, Cyanosis, Ecchymosis, Hemosiderin Staining, Mottled, Pallor, Rubor, Erythema. Periwound temperature was noted as No Abnormality. Wound #3 status is Open. Original cause of wound was Gradually Appeared. [name] wound is located on [name] Right Toe Third. [name] wound measures 0.1cm length x 0.1cm width x 0.1cm depth; 0.008cm^2 area and 0.001cm^3 volume. There is no tunneling or undermining noted. There is a large amount of serous drainage noted. [name] wound margin is flat and intact. There is large (67-100%) pink granulation within [name] wound bed. There is a small (1-33%) amount of necrotic tissue within [name] wound bed including Adherent Slough. [name] periwound skin appearance exhibited: Excoriation, Maceration. [name] periwound skin appearance did not exhibit: Callus, Crepitus, Induration, Rash, Scarring, Dry/Scaly, Atrophie Blanche, Cyanosis, Ecchymosis, Hemosiderin Staining, Mottled, Pallor, Rubor, Erythema. Periwound temperature was noted as No Abnormality. Wound #4 status is Open. Original  cause of wound was Gradually Appeared. [name] wound is located on [name] Right,Dorsal Foot. [name] wound measures 3cm length x 2.4cm width x 0.1cm depth; 5.655cm^2 area and 0.565cm^3 volume. There is no tunneling or undermining noted. There is a large amount of serous drainage noted. [name] wound margin is flat and intact. There is large (67-100%) pink granulation within [name] wound bed. There is a small (1-33%) amount of necrotic tissue within [name] wound bed including Adherent Slough. [name] Minckler, Jyaire E. ([id number]) periwound skin appearance exhibited: Excoriation, Maceration. [name] periwound skin appearance did not exhibit: Callus, Crepitus, Induration, Rash, Scarring, Dry/Scaly,  Atrophie Blanche, Cyanosis, Ecchymosis, Hemosiderin Staining, Mottled, Pallor, Rubor, Erythema. Periwound temperature was noted as No Abnormality. Wound #5 status is Open. Original cause of wound was Gradually Appeared. [name] wound is located on [name] Left Toe Great. [name] wound measures 2.5cm length x 5cm width x 0.1cm depth; 9.817cm^2 area and 0.982cm^3 volume. There is no tunneling or undermining noted. There is a large amount of serous drainage noted. [name] wound margin is flat and intact. There is large (67-100%) pink granulation within [name] wound bed. There is a small (1-33%) amount of necrotic tissue within [name] wound bed including Adherent Slough. [name] periwound skin appearance exhibited: Excoriation, Maceration. [name] periwound skin appearance did not exhibit: Callus, Crepitus, Induration, Rash, Scarring, Dry/Scaly, Atrophie Blanche, Cyanosis, Ecchymosis, Hemosiderin Staining, Mottled, Pallor, Rubor, Erythema. Periwound temperature was noted as No Abnormality. Wound #6 status is Open. Original cause of wound was Gradually Appeared. [name] wound is located on [name] Left Toe Second. [name] wound measures 1.2cm length x 1.5cm width x 0.1cm depth; 1.414cm^2 area and 0.141cm^3 volume. There is no tunneling or undermining noted. There is a large amount  of serous drainage noted. [name] wound margin is flat and intact. There is large (67-100%) pink granulation within [name] wound bed. There is a small (1-33%) amount of necrotic tissue within [name] wound bed including Adherent Slough. [name] periwound skin appearance exhibited: Excoriation, Maceration. [name] periwound skin appearance did not exhibit: Callus, Crepitus, Induration, Rash, Scarring, Dry/Scaly, Atrophie Blanche, Cyanosis, Ecchymosis, Hemosiderin Staining, Mottled, Pallor, Rubor, Erythema. Periwound temperature was noted as No Abnormality. 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 ([id number]) Cleanse wound with mild soap and water May Shower, gently pat wound dry prior to applying new dressing. Wound #2 Right Toe Second: Cleanse wound with mild soap and water May Shower, gently pat wound dry prior to applying new dressing. Wound #3 Right Toe Third: Cleanse wound with mild soap and water May Shower, gently pat wound dry prior to applying new dressing. Wound #4 Right,Dorsal Foot: Cleanse wound with mild soap and water May Shower, gently pat wound dry prior to applying new dressing. Wound #5 Left Toe Great: Cleanse wound with mild soap and water May Shower, gently pat wound dry prior to applying new dressing. Wound #6 Left Toe Second: Cleanse wound with mild soap and water May Shower, gently pat wound dry prior to applying new  dressing. Anesthetic: Wound #1 Right Toe Great: Topical Lidocaine 4% cream applied to wound bed prior to debridement Wound #2 Right Toe Second: Topical Lidocaine 4% cream applied to wound bed prior to debridement Wound #3 Right Toe Third: Topical Lidocaine 4% cream applied to wound bed prior to debridement Wound #4 Right,Dorsal Foot: Topical Lidocaine 4% cream applied to wound bed prior to debridement Wound #5 Left Toe Great: Topical Lidocaine 4% cream applied to wound bed prior to debridement Wound #6 Left Toe Second: Topical Lidocaine 4% cream applied to wound bed prior to debridement Primary Wound Dressing: Wound #1 Right Toe Great: Aquacel Ag Wound #2 Right Toe Second: Aquacel Ag  Wound #3 Right Toe Third: Aquacel Ag Wound #4 Right,Dorsal Foot: Aquacel Ag Wound #5 Left Toe Great: Aquacel Ag Wound #6 Left Toe Second: Aquacel Ag Secondary Dressing: Wound #1 Right Toe Great: Gauze and Kerlix/Conform Wound #2 Right Toe Second: Gauze and Kerlix/Conform Wound #3 Right Toe Third: Gauze and Kerlix/Conform Wound #4 Right,Dorsal Foot: Nicholl, Alverto E. ([id number]) Gauze and Kerlix/Conform Wound #5 Left Toe Great: Gauze and Kerlix/Conform Wound #6 Left Toe Second: Gauze and Kerlix/Conform Dressing Change Frequency: Wound #1 Right Toe Great: Change dressing every day. Wound #2 Right Toe Second: Change dressing every day. Wound #3 Right Toe Third: Change dressing every day. Wound #4 Right,Dorsal Foot: Change dressing every day. Wound #5 Left Toe Great: Change dressing every day. Wound #6 Left Toe Second: Change dressing every day. Follow-up Appointments: Wound #1 Right Toe Great: Return Appointment in 1 week. Wound #2 Right Toe Second: Return Appointment in 1 week. Wound #3 Right Toe Third: Return Appointment in 1 week. Wound #4 Right,Dorsal Foot: Return Appointment in 1 week. Wound #5 Left Toe Great: Return Appointment in 1 week. Wound #6 Left Toe Second: Return  Appointment in 1 week. Edema Control: Wound #1 Right Toe Great: [name] to wear own compression stockings Elevate legs to [name] level of [name] heart and pump ankles as often as possible Wound #2 Right Toe Second: [name] to wear own compression stockings Elevate legs to [name] level of [name] heart and pump ankles as often as possible Wound #3 Right Toe Third: [name] to wear own compression stockings Elevate legs to [name] level of [name] heart and pump ankles as often as possible Wound #4 Right,Dorsal Foot: [name] to wear own compression stockings Elevate legs to [name] level of [name] heart and pump ankles as often as possible Wound #5 Left Toe Great: [name] to wear own compression stockings Elevate legs to [name] level of [name] heart and pump ankles as often as possible Wound #6 Left Toe Second: [name] to wear own compression stockings Elevate legs to [name] level of [name] heart and pump ankles as often as possible Additional Orders / Instructions: MASE, DHONDT ([id number]) Wound #1 Right Toe Great: Stop Smoking - Please continue to stay away from alcohol Other: - Please add vitamin A, vitamin C, and Zinc supplements to your diet Wound #2 Right Toe Second: Stop Smoking - Please continue to stay away from alcohol Other: - Please add vitamin A, vitamin C, and Zinc supplements to your diet Wound #3 Right Toe Third: Stop Smoking - Please continue to stay away from alcohol Other: - Please add vitamin A, vitamin C, and Zinc supplements to your diet Wound #4 Right,Dorsal Foot: Stop Smoking - Please continue to stay away from alcohol Other: - Please add vitamin A, vitamin C, and Zinc supplements to your diet Wound #5 Left Toe Great: Stop Smoking - Please continue to stay away from alcohol Other: - Please add vitamin A, vitamin C, and Zinc supplements to your diet Wound #6 Left Toe Second: Stop Smoking - Please continue to stay away from alcohol Other: - Please add vitamin A, vitamin C, and Zinc supplements  to your diet Home Health: Wound #1 Right Toe Great: [place] Nurse may visit PRN to address [name] s wound care needs. FACE TO FACE ENCOUNTER: MEDICARE and MEDICAID PATIENTS: I certify that this [name] is under my care and that I had a face-to-face encounter that meets [name] physician face-to-face encounter requirements with this [name] on this date. [name] encounter  with [name] was in whole or in part for [name] following MEDICAL CONDITION: (primary reason for [place]) MEDICAL NECESSITY: I certify, that based on my findings, NURSING services are a medically necessary home health service. HOME BOUND STATUS: I certify that my clinical findings support that this [name] is homebound (i.e., Due to illness or injury, pt requires aid of supportive devices such as crutches, cane, wheelchairs, walkers, [name] use of special transportation or [name] assistance of another person to leave their place of residence. There is a normal inability to leave [name] home and doing so requires considerable and taxing effort. Other absences are for medical reasons / religious services and are infrequent or of short duration when for other reasons). If current dressing causes regression in wound condition, may D/C ordered dressing product/s and apply Normal Saline Moist Dressing daily until next [place] / Other MD appointment. [place] of regression in wound condition at [phone]. Please direct any NON-WOUND related issues/requests for orders to [name]'s Primary Care Physician Wound #2 Right Toe Second: [place] Nurse may visit PRN to address [name] s wound care needs. FACE TO FACE ENCOUNTER: MEDICARE and MEDICAID PATIENTS: I certify that this [name] is under my care and that I had a face-to-face encounter that meets [name] physician face-to-face encounter requirements with this [name] on this date. [name] encounter with  [name] was in whole or in part for [name] following MEDICAL CONDITION: (primary reason for [place]) MEDICAL NECESSITY: I certify, that based on my findings, NURSING services are a medically necessary home health service. HOME BOUND STATUS: I certify that my clinical findings support that this [name] is homebound (i.e., Due to illness or injury, pt requires aid of supportive devices such as crutches, cane, wheelchairs, walkers, [name] use of special transportation or [name] assistance of another person to leave their place of residence. There is a normal inability to leave [name] home and doing so requires considerable and taxing effort. Other absences are for medical reasons / religious services and are infrequent or of short duration when for other reasons). If current dressing causes regression in wound condition, may D/C ordered dressing product/s and apply Normal Saline Moist Dressing daily until next [place] / Other MD appointment. Notify Wound Hansen, Rubert E. ([id number]) [place] of regression in wound condition at [phone]. Please direct any NON-WOUND related issues/requests for orders to [name]'s Primary Care Physician Wound #3 Right Toe Third: [place] Nurse may visit PRN to address [name] s wound care needs. FACE TO FACE ENCOUNTER: MEDICARE and MEDICAID PATIENTS: I certify that this [name] is under my care and that I had a face-to-face encounter that meets [name] physician face-to-face encounter requirements with this [name] on this date. [name] encounter with [name] was in whole or in part for [name] following MEDICAL CONDITION: (primary reason for [place]) MEDICAL NECESSITY: I certify, that based on my findings, NURSING services are a medically necessary home health service. HOME BOUND STATUS: I certify that my clinical findings support that this [name] is homebound (i.e., Due to illness or injury, pt requires aid of  supportive devices such as crutches, cane, wheelchairs, walkers, [name] use of special transportation or [name] assistance of another person to leave their place of residence. There is a normal inability to leave [name] home and doing so requires considerable and taxing effort. Other absences are for medical reasons / religious services and are infrequent or of  short duration when for other reasons). If current dressing causes regression in wound condition, may D/C ordered dressing product/s and apply Normal Saline Moist Dressing daily until next [place] / Other MD appointment. [place] of regression in wound condition at [phone]. Please direct any NON-WOUND related issues/requests for orders to [name]'s Primary Care Physician Wound #4 Right,Dorsal Foot: [place] Nurse may visit PRN to address [name] s wound care needs. FACE TO FACE ENCOUNTER: MEDICARE and MEDICAID PATIENTS: I certify that this [name] is under my care and that I had a face-to-face encounter that meets [name] physician face-to-face encounter requirements with this [name] on this date. [name] encounter with [name] was in whole or in part for [name] following MEDICAL CONDITION: (primary reason for [place]) MEDICAL NECESSITY: I certify, that based on my findings, NURSING services are a medically necessary home health service. HOME BOUND STATUS: I certify that my clinical findings support that this [name] is homebound (i.e., Due to illness or injury, pt requires aid of supportive devices such as crutches, cane, wheelchairs, walkers, [name] use of special transportation or [name] assistance of another person to leave their place of residence. There is a normal inability to leave [name] home and doing so requires considerable and taxing effort. Other absences are for medical reasons / religious services and are infrequent or of short duration when for other reasons). If current  dressing causes regression in wound condition, may D/C ordered dressing product/s and apply Normal Saline Moist Dressing daily until next [place] / Other MD appointment. [place] of regression in wound condition at [phone]. Please direct any NON-WOUND related issues/requests for orders to [name]'s Primary Care Physician Wound #5 Left Toe Great: [place] Nurse may visit PRN to address [name] s wound care needs. FACE TO FACE ENCOUNTER: MEDICARE and MEDICAID PATIENTS: I certify that this [name] is under my care and that I had a face-to-face encounter that meets [name] physician face-to-face encounter requirements with this [name] on this date. [name] encounter with [name] was in whole or in part for [name] following MEDICAL CONDITION: (primary reason for [place]) MEDICAL NECESSITY: I certify, that based on my findings, NURSING services are a medically necessary home health service. HOME BOUND STATUS: I certify that my clinical findings support that this [name] is homebound (i.e., Due to illness or injury, pt requires aid of supportive devices such as crutches, cane, wheelchairs, walkers, [name] use of special transportation or [name] assistance of another person to leave their place of residence. There is a normal inability to leave [name] home and doing so requires considerable and taxing effort. Other absences are for medical reasons / religious services and are infrequent or of short duration when for other reasons). If current dressing causes regression in wound condition, may D/C ordered dressing product/s and apply Normal Saline Moist Dressing daily until next [place] / Other MD appointment. Notify Wound Frediani, Momen E. ([id number]) [place] of regression in wound condition at [phone]. Please direct any NON-WOUND related issues/requests for orders to [name]'s Primary Care Physician Wound #6 Left Toe  Second: [place] Nurse may visit PRN to address [name] s wound care needs. FACE TO FACE ENCOUNTER: MEDICARE and MEDICAID PATIENTS: I certify that this [name] is under my care and that I had a face-to-face encounter that meets [name] physician face-to-face encounter requirements with this [name] on this date. [name]  encounter with [name] was in whole or in part for [name] following MEDICAL CONDITION: (primary reason for [place]) MEDICAL NECESSITY: I certify, that based on my findings, NURSING services are a medically necessary home health service. HOME BOUND STATUS: I certify that my clinical findings support that this [name] is homebound (i.e., Due to illness or injury, pt requires aid of supportive devices such as crutches, cane, wheelchairs, walkers, [name] use of special transportation or [name] assistance of another person to leave their place of residence. There is a normal inability to leave [name] home and doing so requires considerable and taxing effort. Other absences are for medical reasons / religious services and are infrequent or of short duration when for other reasons). If current dressing causes regression in wound condition, may D/C ordered dressing product/s and apply Normal Saline Moist Dressing daily until next [place] / Other MD appointment. [place] of regression in wound condition at [phone]. Please direct any NON-WOUND related issues/requests for orders to [name]'s Primary Care Physician After review today I have recommended: 1. Silver alginate to be applied over his wounds, and change daily after washing with soap and water 2. Good control of his diabetes mellitus 3. [name] need to completely give up smoking. 4. regular visits [name] wound center Electronic Signature(s) Signed: 10/08/2016 11:24:36 AM By: [name] Fudge MD, [name] Entered By: [name] on 10/08/2016 11:24:35 Jou, [name]  ([id number]) -------------------------------------------------------------------------------- SuperBill Details [name] Name: Uptain, Maze E. Date of Service: 10/08/2016 Medical Record Number: [id number] [name] Account Number: [id number] Date of Birth/Sex: 05-15-1950 (66 y.o. 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 F17.218 Nicotine dependence, cigarettes, 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 abuse with unspecified alcohol-induced disorder Facility Procedures CPT4 Code: 01410301 Description: 31438 - WOUND CARE VISIT-LEV 5 EST PT Modifier: Quantity: 1 Physician Procedures CPT4: Description Modifier Quantity Code [id number] - WC PHYS LEVEL 3 - EST PT 1 ICD-10 Description Diagnosis E11.621 Type 2 diabetes mellitus with foot ulcer L97.512 Non-pressure chronic ulcer of other part of right foot with fat layer exposed  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

## 2016-10-09 NOTE — Telephone Encounter (Signed)
Faxed pathology to [facility] @ [facility] concerning EUS of appendicular orifice which appeared abnormal on colonoscopy. Mucocele of the appendix.  Contacted [facility] to verify if [facility] received the fax.

## 2016-10-11 ENCOUNTER — Ambulatory Visit (INDEPENDENT_AMBULATORY_CARE_PROVIDER_SITE_OTHER): Payer: Medicare HMO | Admitting: Vascular Surgery

## 2016-10-15 ENCOUNTER — Encounter: Payer: Medicare HMO | Admitting: Physician Assistant

## 2016-10-15 DIAGNOSIS — E11621 Type 2 diabetes mellitus with foot ulcer: Secondary | ICD-10-CM | POA: Diagnosis not present

## 2016-10-16 NOTE — Progress Notes (Signed)
SHAQUELLE, HERNON ([id number]) Visit Report for 10/15/2016 Chief Complaint Document Details Patient Name: [name], [name] [name] 10/15/2016 12:30 Date of Service: PM Medical Record [id number] Number: Patient Account Number: [id number] Date of Birth/Sex: 1951-03-07 (66 y.o. Male) Treating RN: Elyn Peers, Other Clinician: Primary Care Provider: Olivia Mackie Treating STONE III, Charlynn Grimes, Provider/Extender: Referring Provider: Harless Nakayama in Treatment: 3 Information Obtained from: Patient Chief Complaint Patients presents for treatment of an open diabetic ulcer to both feet Electronic Signature(s) Signed: 10/15/2016 4:54:08 PM By: Worthy Keeler PA-C Entered By: Worthy Keeler on 10/15/2016 13:07:50 Bosler, [name] [name] ([id number]) -------------------------------------------------------------------------------- HPI Details Patient Name: [name], [name] [name] 10/15/2016 12:30 Date of Service: PM Medical Record [id number] Number: Patient Account Number: [id number] Date of Birth/Sex: 1950-07-31 (66 y.o. Male) Treating RN: Elyn Peers, Other Clinician: Primary Care Provider: Olivia Mackie Treating STONE III, Charlynn Grimes, Provider/Extender: Referring Provider: Harless Nakayama in Treatment: 3 History of Present Illness Location: bilateral feet ulceration on the toes Quality: Patient reports experiencing a dull pain to affected area(s). Severity: Patient states wound are getting better Duration: Patient has had the wound for > 3 months prior to seeking treatment at the wound center Timing: Pain in wound is constant (hurts all the time) Context: The wound would happen gradually Modifying Factors: Other treatment(s) tried include:treatment for lymphedema and is seen by the podiatrist Dr. Caryl Comes Associated Signs and Symptoms: Patient reports having increase swelling. HPI Description: 66 year old patient here to see as for bilateral feet ulceration to on his left first  and second toe and 2 on his right first and second toe, which she's had for about 4 months. 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 Donella Stade. The patient is also undergoing workup by medical oncology for a intra-abdominal lymphadenopathy. 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 He is also status post appendectomy, inguinal lymph node biopsy, rectal biopsy and rectal examination under anesthesia. he currently smokes cigarettes about half packet a day. In March of this year he was seen by Dr. Hortencia Pilar, for evaluation of bilateral varicose veins and besides wearing compression stockings he had recommended laser ablation of the right and left great saphenous veins to eleviate the symptoms and complications of severe superficial venous reflux disease. He also recommended lymphedema pumps for better control of his lymphedema. The patient recently has had on 08/23/2016, right greater saphenous vein ablation with the laser energy Earlier lower extremity venous reflux examination done on 05/08/2016 showed no DVT or SVT both lower legs but incompetence of bilateral great saphenous veins was present. A lower arterial study was also done and there was no significant right lower and left lower extremity problems based on a normal toe brachial index bilaterally and the ABI was 1.221 the left and 1.23 on the right. His post ablation venous duplex examination showed successful ablation of the right GS vein with thrombus formation 2 below the right saphenofemoral junction. The deep system was patent without evidence of thrombosis and this was done on 08/30/2016. the patient also has a squamous cell cancer of the skin of the buttock and is recently  undergone radiation therapy for this prior to excisional surgery. Addendum: regarding his x-rays done today and x-ray of the left foot -- IMPRESSION: No objective Liotta, Youcef E. ([id number])  evidence of osteomyelitis. There are soft tissue changes which may reflect cellulitis. X-ray of the right foot -- IMPRESSION:Findings compatible with cellulitis of the toes. No objective evidence of osteomyelitis is observed. 10/01/16 on evaluation today patient's wounds appeared to be doing some better. I did review the x-rays as well which showed no evidence of osteomyelitis although there was evidence on x-ray of cellulitis. He fortunately is not having any discomfort although he continues to have some swelling. He does not remember being on any antibiotics recently. 10/15/16 on evaluation today patient's wounds overall appear to be doing better although he does have a new location noted on the left foot. Fortunately he is not having significant pain. It almost has the appearance that something is rubbing on the end of his toes but he wears the open toe shoes and according to what he is telling me never wears anything that would rub on his foot. There is no evidence of infection and specifically no evidence of a fungal infection Electronic Signature(s) Signed: 10/15/2016 4:54:08 PM By: Worthy Keeler PA-C Entered By: Worthy Keeler on 10/15/2016 13:13:22 [name] [name], [name] [name] ([id number]) -------------------------------------------------------------------------------- Physical Exam Details Patient Name: [name] [name], [name] [name] 10/15/2016 12:30 Date of Service: PM Medical Record [id number] Number: Patient Account Number: [id number] Date of Birth/Sex: December 13, 1950 (66 y.o. Male) Treating RN: Elyn Peers, Other Clinician: Primary Care Provider: TRACY Treating STONE III, Charlynn Grimes, Provider/Extender: Referring Provider: Harless Nakayama in Treatment: 3 Constitutional Thin and well-hydrated  in no acute distress. Respiratory normal breathing without difficulty. clear to auscultation bilaterally. Cardiovascular regular rate and rhythm with normal S1, S2. Psychiatric this patient is able to make decisions and demonstrates good insight into disease process. Alert and Oriented x 3. pleasant and cooperative. Notes Patient's wounds appeared to be doing fairly well and in fact are feeling in several locations. The silver alginate dressing appears to be doing his job. Electronic Signature(s) Signed: 10/15/2016 4:54:08 PM By: Worthy Keeler PA-C Entered By: Worthy Keeler on 10/15/2016 13:14:43 [place], [name] [name] ([id number]) -------------------------------------------------------------------------------- Physician Orders Details Patient Name: [name] [name], [name] [name] 10/15/2016 12:30 Date of Service: PM Medical Record [id number] Number: Patient Account Number: [id number] Date of Birth/Sex: 05/15/1950 (66 y.o. Male) Treating RN: Elyn Peers, Other Clinician: Primary Care Provider: Olivia Mackie Treating STONE III, Charlynn Grimes, Provider/Extender: Referring Provider: Harless Nakayama in Treatment: 3 Verbal / Phone Orders: No Diagnosis Coding ICD-10 Coding Code Description E11.621 Type 2 diabetes mellitus with foot ulcer I87.313 Chronic venous hypertension (idiopathic) with ulcer of bilateral lower extremity I89.0 Lymphedema, not elsewhere classified L97.522 Non-pressure chronic ulcer of other part of left foot with fat layer exposed L97.512 Non-pressure chronic ulcer of other part of right foot with fat layer exposed F17.218 Nicotine dependence, cigarettes, 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 abuse with unspecified alcohol-induced disorder Wound Cleansing Wound #1 Right Toe Great o Cleanse wound with mild soap and water o May Shower, gently pat wound dry prior to applying new dressing. Wound #2 Right Toe Second o Cleanse wound with mild soap and  water o May Shower, gently pat wound dry prior to applying new dressing. Wound #3 Right Toe Third o Cleanse wound with mild soap and water o May Shower, gently pat wound dry prior to applying new dressing. Wound #4 Right,Dorsal Foot o Cleanse wound with mild soap and water o May Shower, gently pat wound dry prior to applying new dressing. Wound #5 Left Toe Great o Cleanse wound with mild soap and water o May Shower, gently  pat wound dry prior to applying new dressing. Wound #6 Left Toe Second o Cleanse wound with mild soap and water Nilsson, Luz E. ([id number]) o May Shower, gently pat wound dry prior to applying new dressing. Wound #7 Left Toe Third o Cleanse wound with mild soap and water o May Shower, gently pat wound dry prior to applying new dressing. Anesthetic Wound #1 Right Toe Great o Topical Lidocaine 4% cream applied to wound bed prior to debridement Wound #2 Right Toe Second o Topical Lidocaine 4% cream applied to wound bed prior to debridement Wound #3 Right Toe Third o Topical Lidocaine 4% cream applied to wound bed prior to debridement Wound #4 Right,Dorsal Foot o Topical Lidocaine 4% cream applied to wound bed prior to debridement Wound #5 Left Toe Great o Topical Lidocaine 4% cream applied to wound bed prior to debridement Wound #6 Left Toe Second o Topical Lidocaine 4% cream applied to wound bed prior to debridement Wound #7 Left Toe Third o Topical Lidocaine 4% cream applied to wound bed prior to debridement Primary Wound Dressing Wound #1 Right Toe Great o Aquacel Ag Wound #2 Right Toe Second o Aquacel Ag Wound #3 Right Toe Third o Aquacel Ag Wound #4 Right,Dorsal Foot o Aquacel Ag Wound #5 Left Toe Great o Aquacel Ag Wound #6 Left Toe Second o Aquacel Ag Wound #7 Left Toe Third o Aquacel Ag Jokerst, Oseias E. ([id number]) Secondary Dressing Wound #1 Right Toe Great o Gauze and Kerlix/Conform Wound  #2 Right Toe Second o Gauze and Kerlix/Conform Wound #3 Right Toe Third o Gauze and Kerlix/Conform Wound #4 Right,Dorsal Foot o Gauze and Kerlix/Conform Wound #5 Left Toe Great o Gauze and Kerlix/Conform Wound #6 Left Toe Second o Gauze and Kerlix/Conform Wound #7 Left Toe Third o Gauze and Kerlix/Conform Dressing Change Frequency Wound #1 Right Toe Great o Change dressing every day. Wound #2 Right Toe Second o Change dressing every day. Wound #3 Right Toe Third o Change dressing every day. Wound #4 Right,Dorsal Foot o Change dressing every day. Wound #5 Left Toe Great o Change dressing every day. Wound #6 Left Toe Second o Change dressing every day. Wound #7 Left Toe Third o Change dressing every day. Follow-up Appointments Wound #1 Right Toe Great o Return Appointment in 1 week. [name] [name], [name] ([id number]) Wound #2 Right Toe Second o Return Appointment in 1 week. Wound #3 Right Toe Third o Return Appointment in 1 week. Wound #4 Right,Dorsal Foot o Return Appointment in 1 week. Wound #5 Left Toe Great o Return Appointment in 1 week. Wound #6 Left Toe Second o Return Appointment in 1 week. Wound #7 Left Toe Third o Return Appointment in 1 week. Edema Control Wound #1 Right Toe Great o Patient to wear own compression stockings o Elevate legs to the level of the heart and pump ankles as often as possible Wound #2 Right Toe Second o Patient to wear own compression stockings o Elevate legs to the level of the heart and pump ankles as often as possible Wound #3 Right Toe Third o Patient to wear own compression stockings o Elevate legs to the level of the heart and pump ankles as often as possible Wound #4 Right,Dorsal Foot o Patient to wear own compression stockings o Elevate legs to the level of the heart and pump ankles as often as possible Wound #5 Left Toe Great o Patient to wear own compression  stockings o Elevate legs to the level of the heart and pump ankles  as often as possible Wound #6 Left Toe Second o Patient to wear own compression stockings o Elevate legs to the level of the heart and pump ankles as often as possible Wound #7 Left Toe Third o Patient to wear own compression stockings o Elevate legs to the level of the heart and pump ankles as often as possible Additional Orders / Instructions Koestner, Caedan E. ([id number]) Wound #1 Right Toe Great o Stop Smoking - Please continue to stay away from alcohol o Other: - Please add vitamin A, vitamin C, and Zinc supplements to your diet Wound #2 Right Toe Second o Stop Smoking - Please continue to stay away from alcohol o Other: - Please add vitamin A, vitamin C, and Zinc supplements to your diet Wound #3 Right Toe Third o Stop Smoking - Please continue to stay away from alcohol o Other: - Please add vitamin A, vitamin C, and Zinc supplements to your diet Wound #4 Right,Dorsal Foot o Stop Smoking - Please continue to stay away from alcohol o Other: - Please add vitamin A, vitamin C, and Zinc supplements to your diet Wound #5 Left Toe Great o Stop Smoking - Please continue to stay away from alcohol o Other: - Please add vitamin A, vitamin C, and Zinc supplements to your diet Wound #6 Left Toe Second o Stop Smoking - Please continue to stay away from alcohol o Other: - Please add vitamin A, vitamin C, and Zinc supplements to your diet Wound #7 Left Toe Third o Stop Smoking - Please continue to stay away from alcohol o Other: - Please add vitamin A, vitamin C, and Zinc supplements to your diet Home Health Wound #1 Right Toe Great o [name] TO [name] ENCOUNTER: MEDICARE and MEDICAID PATIENTS: I certify that this patient is under my care and that I had a [name]-to-[name] encounter that meets the physician [name]-to-[name] encounter requirements with this patient on this date. The encounter with the  patient was in whole or in part for the following MEDICAL CONDITION: (primary reason for [place]) MEDICAL NECESSITY: I certify, that based on my findings, NURSING services are a medically necessary home health service. HOME BOUND STATUS: I certify that my clinical findings support that this patient is homebound (i.e., Due to illness or injury, pt requires aid of supportive devices such as crutches, cane, wheelchairs, walkers, the use of special transportation or the assistance of another person to leave their place of residence. There is a normal inability to leave the home and doing so requires considerable and taxing effort. Other absences are for medical reasons / religious services and are infrequent or of short duration when for other reasons). o D/C Home Health Services Wound #2 Right Toe Second o [name] TO [name] ENCOUNTER: MEDICARE and MEDICAID PATIENTS: I certify that this patient is under my care and that I had a [name]-to-[name] encounter that meets the physician [name]-to-[name] encounter requirements with this patient on this date. The encounter with the patient was in whole or in part for the following MEDICAL CONDITION: (primary reason for [place]) Cashin, HERSHALL BENKERT ([id number]) MEDICAL NECESSITY: I certify, that based on my findings, NURSING services are a medically necessary home health service. HOME BOUND STATUS: I certify that my clinical findings support that this patient is homebound (i.e., Due to illness or injury, pt requires aid of supportive devices such as crutches, cane, wheelchairs, walkers, the use of special transportation or the assistance of another person to leave their place of residence. There is a  normal inability to leave the home and doing so requires considerable and taxing effort. Other absences are for medical reasons / religious services and are infrequent or of short duration when for other reasons). o D/C Home Health Services Wound #3 Right  Toe Third o [name] TO [name] ENCOUNTER: MEDICARE and MEDICAID PATIENTS: I certify that this patient is under my care and that I had a [name]-to-[name] encounter that meets the physician [name]-to-[name] encounter requirements with this patient on this date. The encounter with the patient was in whole or in part for the following MEDICAL CONDITION: (primary reason for [place]) MEDICAL NECESSITY: I certify, that based on my findings, NURSING services are a medically necessary home health service. HOME BOUND STATUS: I certify that my clinical findings support that this patient is homebound (i.e., Due to illness or injury, pt requires aid of supportive devices such as crutches, cane, wheelchairs, walkers, the use of special transportation or the assistance of another person to leave their place of residence. There is a normal inability to leave the home and doing so requires considerable and taxing effort. Other absences are for medical reasons / religious services and are infrequent or of short duration when for other reasons). o D/C Home Health Services Wound #4 Right,Dorsal Foot o [name] TO [name] ENCOUNTER: MEDICARE and MEDICAID PATIENTS: I certify that this patient is under my care and that I had a [name]-to-[name] encounter that meets the physician [name]-to-[name] encounter requirements with this patient on this date. The encounter with the patient was in whole or in part for the following MEDICAL CONDITION: (primary reason for [place]) MEDICAL NECESSITY: I certify, that based on my findings, NURSING services are a medically necessary home health service. HOME BOUND STATUS: I certify that my clinical findings support that this patient is homebound (i.e., Due to illness or injury, pt requires aid of supportive devices such as crutches, cane, wheelchairs, walkers, the use of special transportation or the assistance of another person to leave their place of residence. There is a normal  inability to leave the home and doing so requires considerable and taxing effort. Other absences are for medical reasons / religious services and are infrequent or of short duration when for other reasons). o D/C Home Health Services Wound #5 Left Toe Great o [name] TO [name] ENCOUNTER: MEDICARE and MEDICAID PATIENTS: I certify that this patient is under my care and that I had a [name]-to-[name] encounter that meets the physician [name]-to-[name] encounter requirements with this patient on this date. The encounter with the patient was in whole or in part for the following MEDICAL CONDITION: (primary reason for [place]) MEDICAL NECESSITY: I certify, that based on my findings, NURSING services are a medically necessary home health service. HOME BOUND STATUS: I certify that my clinical findings support that this patient is homebound (i.e., Due to illness or injury, pt requires aid of supportive devices such as crutches, cane, wheelchairs, walkers, the use of special transportation or the assistance of another person to leave their place of residence. There is a normal inability to leave the home and doing so requires considerable and taxing effort. Other Babler, Luciano E. ([id number]) absences are for medical reasons / religious services and are infrequent or of short duration when for other reasons). o D/C Home Health Services Wound #6 Left Toe Second o [name] TO [name] ENCOUNTER: MEDICARE and MEDICAID PATIENTS: I certify that this patient is under my care and that I had a [name]-to-[name] encounter that meets the physician  [name]-to-[name] encounter requirements with this patient on this date. The encounter with the patient was in whole or in part for the following MEDICAL CONDITION: (primary reason for [place]) MEDICAL NECESSITY: I certify, that based on my findings, NURSING services are a medically necessary home health service. HOME BOUND STATUS: I certify that my clinical findings support  that this patient is homebound (i.e., Due to illness or injury, pt requires aid of supportive devices such as crutches, cane, wheelchairs, walkers, the use of special transportation or the assistance of another person to leave their place of residence. There is a normal inability to leave the home and doing so requires considerable and taxing effort. Other absences are for medical reasons / religious services and are infrequent or of short duration when for other reasons). o D/C Home Health Services Wound #7 Left Toe Third o [name] TO [name] ENCOUNTER: MEDICARE and MEDICAID PATIENTS: I certify that this patient is under my care and that I had a [name]-to-[name] encounter that meets the physician [name]-to-[name] encounter requirements with this patient on this date. The encounter with the patient was in whole or in part for the following MEDICAL CONDITION: (primary reason for [place]) MEDICAL NECESSITY: I certify, that based on my findings, NURSING services are a medically necessary home health service. HOME BOUND STATUS: I certify that my clinical findings support that this patient is homebound (i.e., Due to illness or injury, pt requires aid of supportive devices such as crutches, cane, wheelchairs, walkers, the use of special transportation or the assistance of another person to leave their place of residence. There is a normal inability to leave the home and doing so requires considerable and taxing effort. Other absences are for medical reasons / religious services and are infrequent or of short duration when for other reasons). o D/C Home Health Services Notes I'm going to recommend that we continue with the Current wound care measures for the next week. We will see him for reevaluation at that point. Overall his wounds do appear to be improving although he has one new location this does not appear to be a severe wound site. If anything worsens in the interim he will contact the  office for additional recommendations. Electronic Signature(s) Signed: 10/15/2016 4:54:08 PM By: Worthy Keeler PA-C Entered By: Worthy Keeler on 10/15/2016 13:15:29 [place], [name] [name] ([id number]) -------------------------------------------------------------------------------- Problem List Details Patient Name: [name] [name], [name] [name] 10/15/2016 12:30 Date of Service: PM Medical Record [id number] Number: Patient Account Number: [id number] Date of Birth/Sex: April 28, 1950 (66 y.o. Male) Treating RN: Elyn Peers, Other Clinician: Primary Care Provider: Olivia Mackie Treating STONE III, Charlynn Grimes, Provider/Extender: Referring Provider: Harless Nakayama in Treatment: 3 Active Problems ICD-10 Encounter Code Description Active Date Diagnosis E11.621 Type 2 diabetes mellitus with foot ulcer 09/21/2016 Yes I87.313 Chronic venous hypertension (idiopathic) with ulcer of 09/21/2016 Yes bilateral lower extremity I89.0 Lymphedema, not elsewhere classified 09/21/2016 Yes L97.522 Non-pressure chronic ulcer of other part of left foot with fat 09/21/2016 Yes layer exposed L97.512 Non-pressure chronic ulcer of other part of right foot with 09/21/2016 Yes fat layer exposed F17.218 Nicotine dependence, cigarettes, 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/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 F10.19 Alcohol abuse with unspecified alcohol-induced disorder 09/21/2016 Yes Inactive Problems Resolved Problems Electronic Signature(s) [name] [name], [name] [name] ([id number]) Signed: 10/15/2016 4:54:08 PM By: Worthy Keeler PA-C Entered By: Worthy Keeler on 10/15/2016 12:58:35 [name] [name], [name] [name] ([id number]) -------------------------------------------------------------------------------- Progress Note Details Patient Name: [name] [name] 10/15/2016 12:30 Date of Service: PM Medical Record [id number] Number: Patient  Account Number: [id number] Date of Birth/Sex: 1950-07-31 (66 y.o. Male) Treating RN: Dorthy, Carney Living, Other  Clinician: Primary Care Provider: TRACY Treating STONE III, Woodfin Kiss MCLEAN-SCOCOZZA, Provider/Extender: Referring Provider: Harless Nakayama in Treatment: 3 Subjective Chief Complaint Information obtained from Patient Patients presents for treatment of an open diabetic ulcer to both feet History of Present Illness (HPI) The following HPI elements were documented for the patient's wound: Location: bilateral feet ulceration on the toes Quality: Patient reports experiencing a dull pain to affected area(s). Severity: Patient states wound are getting better Duration: Patient has had the wound for > 3 months prior to seeking treatment at the wound center Timing: Pain in wound is constant (hurts all the time) Context: The wound would happen gradually Modifying Factors: Other treatment(s) tried include:treatment for lymphedema and is seen by the podiatrist Dr. Caryl Comes Associated Signs and Symptoms: Patient reports having increase swelling. 66 year old patient here to see as for bilateral feet ulceration to on his left first and second toe and 2 on his right first and second toe, which she's had for about 4 months. 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 Donella Stade. The patient is also undergoing workup by medical oncology for a intra- abdominal lymphadenopathy. 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 He is also status post appendectomy, inguinal lymph node biopsy, rectal biopsy and rectal examination under anesthesia. he currently smokes cigarettes about half packet a day. In March of this year he was seen by Dr. Hortencia Pilar, for evaluation of bilateral varicose veins and besides wearing compression stockings he had recommended laser ablation of the  right and left great saphenous veins to eleviate the symptoms and complications of severe superficial venous reflux disease. He also recommended lymphedema pumps for better control of his lymphedema. The patient recently has had on 08/23/2016, right greater saphenous vein ablation with the laser energy Earlier lower extremity venous reflux examination done on 05/08/2016 showed no DVT or SVT both lower legs but incompetence of bilateral great saphenous veins was present. A lower arterial study was also done and there was no significant right lower and left lower extremity problems based on a normal toe brachial index bilaterally and the ABI was 1.221 the left and 1.23 on the Decelles, Jeptha E. ([id number]) right. His post ablation venous duplex examination showed successful ablation of the right GS vein with thrombus formation 2 below the right saphenofemoral junction. The deep system was patent without evidence of thrombosis and this was done on 08/30/2016. 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 Addendum: regarding his x-rays done today and x-ray of the left foot -- IMPRESSION: No objective evidence of osteomyelitis. There are soft tissue changes which may reflect cellulitis. X-ray of the right foot -- IMPRESSION:Findings compatible with cellulitis of the toes. No objective evidence of osteomyelitis is observed. 10/01/16 on evaluation today patient's wounds appeared to be doing some better. I did review the x-rays as well which showed no evidence of osteomyelitis although there was evidence on x-ray of cellulitis. He fortunately is not having any discomfort although he continues to have some swelling. He does not remember being on any antibiotics recently. 10/15/16 on evaluation today patient's  wounds overall appear to be doing better although he does have a new location noted on the  left foot. Fortunately he is not having significant pain. It almost has the appearance that something is rubbing on the end of his toes but he wears the open toe shoes and according to what he is telling me never wears anything that would rub on his foot. There is no evidence of infection and specifically no evidence of a fungal infection Objective Constitutional Thin and well-hydrated in no acute distress. Vitals Time Taken: 12:44 PM, Height: 69 in, Weight: 168 lbs, BMI: 24.8, Temperature: 98.0 F, Pulse: 63 bpm, Respiratory Rate: 16 breaths/min, Blood Pressure: 141/73 mmHg. Respiratory normal breathing without difficulty. clear to auscultation bilaterally. Cardiovascular regular rate and rhythm with normal S1, S2. Psychiatric this patient is able to make decisions and demonstrates good insight into disease process. Alert and Oriented x 3. pleasant and cooperative. [name], [name] [name] ([id number]) General Notes: Patient's wounds appeared to be doing fairly well and in fact are feeling in several locations. The silver alginate dressing appears to be doing his job. Integumentary (Hair, Skin) Wound #1 status is Open. Original cause of wound was Gradually Appeared. The wound is located on the Right Toe Great. The wound measures 7.5cm length x 6.5cm width x 0.1cm depth; 38.288cm^2 area and 3.829cm^3 volume. There is Fat Layer (Subcutaneous Tissue) Exposed exposed. There is no tunneling or undermining noted. There is a large amount of serous drainage noted. The wound margin is flat and intact. There is large (67-100%) pink granulation within the wound bed. There is a small (1-33%) amount of necrotic tissue within the wound bed including Adherent Slough. The periwound skin appearance exhibited: Excoriation, Maceration. The periwound skin appearance did not exhibit: Callus, Crepitus, Induration, Rash, Scarring, Dry/Scaly, Atrophie Blanche, Cyanosis, Ecchymosis, Hemosiderin Staining, Mottled,  Pallor, Rubor, Erythema. Periwound temperature was noted as No Abnormality. Wound #2 status is Open. Original cause of wound was Gradually Appeared. The wound is located on the Right Toe Second. The wound measures 0.9cm length x 1.1cm width x 0.1cm depth; 0.778cm^2 area and 0.078cm^3 volume. There is Fat Layer (Subcutaneous Tissue) Exposed exposed. There is no tunneling or undermining noted. There is a large amount of serous drainage noted. The wound margin is flat and intact. There is medium (34-66%) pink granulation within the wound bed. There is a medium (34-66%) amount of necrotic tissue within the wound bed including Adherent Slough. The periwound skin appearance exhibited: Excoriation, Maceration. The periwound skin appearance did not exhibit: Callus, Crepitus, Induration, Rash, Scarring, Dry/Scaly, Atrophie Blanche, Cyanosis, Ecchymosis, Hemosiderin Staining, Mottled, Pallor, Rubor, Erythema. Periwound temperature was noted as No Abnormality. Wound #3 status is Open. Original cause of wound was Gradually Appeared. The wound is located on the Right Toe Third. The wound measures 0.5cm length x 0.7cm width x 0.1cm depth; 0.275cm^2 area and 0.027cm^3 volume. There is Fat Layer (Subcutaneous Tissue) Exposed exposed. There is no tunneling or undermining noted. There is a large amount of serous drainage noted. The wound margin is flat and intact. There is large (67-100%) pink granulation within the wound bed. There is a small (1-33%) amount of necrotic tissue within the wound bed including Adherent Slough. The periwound skin appearance exhibited: Excoriation, Maceration. The periwound skin appearance did not exhibit: Callus, Crepitus, Induration, Rash, Scarring, Dry/Scaly, Atrophie Blanche, Cyanosis, Ecchymosis, Hemosiderin Staining, Mottled, Pallor, Rubor, Erythema. Periwound temperature was noted as No Abnormality. Wound #4 status is Open. Original cause of wound was Gradually Appeared. The  wound is located on the Right,Dorsal Foot. The wound measures 2.3cm length x 2.5cm width x 0.1cm depth; 4.516cm^2 area and 0.452cm^3 volume. There is Fat Layer (Subcutaneous Tissue) Exposed exposed. There is no tunneling or undermining noted. There is a large amount of serous drainage noted. The wound margin is flat and intact. There is medium (34-66%) pink granulation within the wound bed. There is a medium (34-66%) amount of necrotic tissue within the wound bed including Adherent Slough. The periwound skin appearance exhibited: Excoriation, Maceration. The periwound skin appearance did not exhibit: Callus, Crepitus, Induration, Rash, Scarring, Dry/Scaly, Atrophie Blanche, Cyanosis, Ecchymosis, Hemosiderin Staining, Mottled, Pallor, Rubor, Erythema. Periwound temperature was noted as No Abnormality. Wound #5 status is Open. Original cause of wound was Gradually Appeared. The wound is located on the Left Toe Great. The wound measures 2cm length x 3.2cm width x 0.1cm depth; 5.027cm^2 area and 0.503cm^3 volume. There is Fat Layer (Subcutaneous Tissue) Exposed exposed. There is no tunneling or undermining noted. There is a large amount of serous drainage noted. The wound margin is flat and intact. There is medium (34-66%) pink granulation within the wound bed. There is a medium (34-66%) amount of necrotic tissue within the wound bed including Adherent Slough. The periwound skin appearance exhibited: [name] [name], [name] ([id number]) Excoriation, Maceration. The periwound skin appearance did not exhibit: Callus, Crepitus, Induration, Rash, Scarring, Dry/Scaly, Atrophie Blanche, Cyanosis, Ecchymosis, Hemosiderin Staining, Mottled, Pallor, Rubor, Erythema. Periwound temperature was noted as No Abnormality. Wound #6 status is Open. Original cause of wound was Gradually Appeared. The wound is located on the Left Toe Second. The wound measures 0.9cm length x 1.1cm width x 0.1cm depth; 0.778cm^2 area  and 0.078cm^3 volume. There is Fat Layer (Subcutaneous Tissue) Exposed exposed. There is no tunneling or undermining noted. There is a large amount of serous drainage noted. The wound margin is flat and intact. There is medium (34-66%) pink granulation within the wound bed. There is a medium (34-66%) amount of necrotic tissue within the wound bed including Adherent Slough. The periwound skin appearance exhibited: Excoriation, Maceration. The periwound skin appearance did not exhibit: Callus, Crepitus, Induration, Rash, Scarring, Dry/Scaly, Atrophie Blanche, Cyanosis, Ecchymosis, Hemosiderin Staining, Mottled, Pallor, Rubor, Erythema. Periwound temperature was noted as No Abnormality. Wound #7 status is Open. Original cause of wound was Gradually Appeared. The wound is located on the Left Toe Third. The wound measures 1.3cm length x 1.5cm width x 0.1cm depth; 1.532cm^2 area and 0.153cm^3 volume. There is Fat Layer (Subcutaneous Tissue) Exposed exposed. There is no tunneling or undermining noted. There is a large amount of serous drainage noted. The wound margin is flat and intact. There is no granulation within the wound bed. There is a large (67-100%) amount of necrotic tissue within the wound bed including Adherent Slough. The periwound skin appearance exhibited: Excoriation. The periwound skin appearance did not exhibit: Callus, Crepitus, Induration, Rash, Scarring, Dry/Scaly, Maceration, Atrophie Blanche, Cyanosis, Ecchymosis, Hemosiderin Staining, Mottled, Pallor, Rubor, Erythema. 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 Wound #2 Right Toe Second: Cleanse wound with mild soap and water May Shower, gently pat wound dry prior to applying new dressing. Wound #3 Right Toe Third: Cleanse wound with mild soap and water May Shower, gently pat wound dry prior to applying new dressing. Wound #4 Right,Dorsal Foot: Cleanse wound with mild soap and water May Shower, gently pat wound dry prior to applying new dressing. Wound #5 Left Toe Great: Cleanse wound with mild soap and water May Shower, gently pat wound dry prior to applying new dressing. Wound #6 Left Toe Second: Cleanse wound with mild soap and water May Shower, gently pat wound dry prior to applying new dressing. Wound #7 Left Toe Third: Cleanse wound with mild soap and water May Shower, gently pat wound dry prior to applying new dressing. Anesthetic: Wound #1 Right Toe Great: Topical Lidocaine 4% cream applied to wound bed prior to debridement Wound #2 Right Toe Second: Topical Lidocaine 4% cream applied to wound bed prior to debridement Wound #3 Right Toe Third: Topical Lidocaine 4% cream applied to wound bed prior to debridement Wound #4 Right,Dorsal Foot: Topical Lidocaine 4% cream applied to wound bed prior to debridement Wound #5 Left Toe Great: Topical Lidocaine 4% cream applied to wound bed prior to debridement Wound #6 Left Toe Second: Topical Lidocaine 4% cream applied to wound bed prior to debridement Wound #7 Left Toe Third: Topical Lidocaine 4% cream applied to wound bed prior to debridement Primary Wound Dressing: Wound #1 Right Toe Great: Aquacel Ag Wound #2 Right Toe Second: Aquacel Ag Wound #3 Right Toe Third: Aquacel  Ag Wound #4 Right,Dorsal Foot: Aquacel Ag Wound #5 Left Toe Great: Aquacel Ag Wound #6 Left Toe Second: Aquacel Ag Wound #7 Left Toe Third: Aquacel Ag Secondary Dressing: Wound #1 Right Toe GreatDAMONDRE, PFEIFLE ([id number]) Gauze and Kerlix/Conform Wound #2 Right Toe Second: Gauze and Kerlix/Conform Wound #3 Right Toe Third: Gauze and Kerlix/Conform Wound #4 Right,Dorsal Foot: Gauze and Kerlix/Conform Wound #5 Left Toe Great: Gauze and Kerlix/Conform Wound #6 Left Toe Second: Gauze and Kerlix/Conform Wound #7 Left Toe Third: Gauze and Kerlix/Conform Dressing Change Frequency: Wound #1 Right Toe Great: Change dressing every day. Wound #2 Right Toe Second: Change dressing every day. Wound #3 Right Toe Third: Change dressing every day. Wound #4 Right,Dorsal Foot: Change dressing every day. Wound #5 Left Toe Great: Change dressing every day. Wound #6 Left Toe Second: Change dressing every day. Wound #7 Left Toe Third: Change dressing every day. Follow-up Appointments: Wound #1 Right Toe Great: Return Appointment in 1 week. Wound #2 Right Toe Second: Return Appointment in 1 week. Wound #3 Right Toe Third: Return Appointment in 1 week. Wound #4 Right,Dorsal Foot: Return Appointment in 1 week. Wound #5 Left Toe Great: Return Appointment in 1 week. Wound #6 Left Toe Second: Return Appointment in 1 week. Wound #7 Left Toe Third: Return Appointment in 1 week. Edema Control: Wound #1 Right Toe Great: Patient to wear own compression stockings Elevate legs to the level of the heart and pump ankles as often as possible Wound #2 Right Toe Second: Patient to wear own compression stockings Elevate legs to the level of the heart and pump ankles as often as possible Wound #3 Right Toe Third: [name], [place] E. ([id number]) Patient to wear own compression stockings Elevate legs to the level of the heart and pump ankles as often as possible Wound #4 Right,Dorsal  Foot: Patient to wear own compression stockings Elevate legs to the level of the heart and pump ankles as often as possible Wound #5 Left Toe Great: Patient to wear own compression stockings Elevate legs to the level of the heart and pump ankles as often as possible Wound #6 Left Toe Second: Patient to wear own compression stockings Elevate legs to the level of the heart and pump ankles as often as possible Wound #7 Left Toe Third: Patient to wear own compression stockings Elevate legs to the level of the heart and pump ankles as often as possible Additional Orders / Instructions: Wound #1 Right Toe Great: Stop Smoking - Please continue to stay away from alcohol Other: - Please add vitamin A, vitamin C, and Zinc supplements to your diet Wound #2 Right Toe Second: Stop Smoking - Please continue to stay away from alcohol Other: - Please add vitamin A, vitamin C, and Zinc supplements to your diet Wound #3 Right Toe Third: Stop Smoking - Please continue to stay away from alcohol Other: - Please add vitamin A, vitamin C, and Zinc supplements to your diet Wound #4 Right,Dorsal Foot: Stop Smoking - Please continue to stay away from alcohol Other: - Please add vitamin A, vitamin C, and Zinc supplements to your diet Wound #5 Left Toe Great: Stop Smoking - Please continue to stay away from alcohol Other: - Please add vitamin A, vitamin C, and Zinc supplements to your diet Wound #6 Left Toe Second: Stop Smoking - Please continue to stay away from alcohol Other: - Please add vitamin A, vitamin C, and Zinc supplements to your diet Wound #7 Left Toe Third: Stop Smoking - Please continue to stay away from alcohol Other: - Please add vitamin A, vitamin C, and Zinc supplements to your diet Home Health: Wound #1 Right Toe Great: [name] TO [name] ENCOUNTER: MEDICARE and MEDICAID PATIENTS: I certify that this patient is under my care and that I had a [name]-to-[name] encounter that meets the physician  [name]-to-[name] encounter requirements with this patient on this date. The encounter with the patient was in whole or in part for the following MEDICAL CONDITION: (primary reason for [place]) MEDICAL NECESSITY: I certify, that based on my findings, NURSING services are a medically necessary home health service. HOME BOUND STATUS: I certify that my clinical findings support that this patient is homebound (i.e., Due to illness or injury, pt requires aid of supportive devices such as crutches, cane, wheelchairs, walkers, the use of special transportation or the assistance of another person to leave their place of residence. There is a normal inability to leave the home and doing so requires considerable and taxing effort. Other absences are for medical reasons / religious services and are infrequent or of short duration when for other reasons). D/C Home Health Services Wound #2 Right Toe Second: [name] TO [name] ENCOUNTER: MEDICARE and MEDICAID PATIENTS: I certify that this patient is under [place], Darek E. ([id number]) my care and that I had a [name]-to-[name] encounter that meets the physician [name]-to-[name] encounter requirements with this patient on this date. The encounter with the patient was in whole or in part for the following MEDICAL CONDITION: (primary reason for [place]) MEDICAL NECESSITY: I certify, that based on my findings, NURSING services are a medically necessary home health service. HOME BOUND STATUS: I certify that my clinical findings support that this patient is homebound (i.e., Due to illness or injury, pt requires aid of supportive devices such as crutches, cane, wheelchairs, walkers, the  use of special transportation or the assistance of another person to leave their place of residence. There is a normal inability to leave the home and doing so requires considerable and taxing effort. Other absences are for medical reasons / religious services and are infrequent or of  short duration when for other reasons). D/C Home Health Services Wound #3 Right Toe Third: [name] TO [name] ENCOUNTER: MEDICARE and MEDICAID PATIENTS: I certify that this patient is under my care and that I had a [name]-to-[name] encounter that meets the physician [name]-to-[name] encounter requirements with this patient on this date. The encounter with the patient was in whole or in part for the following MEDICAL CONDITION: (primary reason for [place]) MEDICAL NECESSITY: I certify, that based on my findings, NURSING services are a medically necessary home health service. HOME BOUND STATUS: I certify that my clinical findings support that this patient is homebound (i.e., Due to illness or injury, pt requires aid of supportive devices such as crutches, cane, wheelchairs, walkers, the use of special transportation or the assistance of another person to leave their place of residence. There is a normal inability to leave the home and doing so requires considerable and taxing effort. Other absences are for medical reasons / religious services and are infrequent or of short duration when for other reasons). D/C Home Health Services Wound #4 Right,Dorsal Foot: [name] TO [name] ENCOUNTER: MEDICARE and MEDICAID PATIENTS: I certify that this patient is under my care and that I had a [name]-to-[name] encounter that meets the physician [name]-to-[name] encounter requirements with this patient on this date. The encounter with the patient was in whole or in part for the following MEDICAL CONDITION: (primary reason for [place]) MEDICAL NECESSITY: I certify, that based on my findings, NURSING services are a medically necessary home health service. HOME BOUND STATUS: I certify that my clinical findings support that this patient is homebound (i.e., Due to illness or injury, pt requires aid of supportive devices such as crutches, cane, wheelchairs, walkers, the use of special transportation or the assistance of another  person to leave their place of residence. There is a normal inability to leave the home and doing so requires considerable and taxing effort. Other absences are for medical reasons / religious services and are infrequent or of short duration when for other reasons). D/C Home Health Services Wound #5 Left Toe Great: [name] TO [name] ENCOUNTER: MEDICARE and MEDICAID PATIENTS: I certify that this patient is under my care and that I had a [name]-to-[name] encounter that meets the physician [name]-to-[name] encounter requirements with this patient on this date. The encounter with the patient was in whole or in part for the following MEDICAL CONDITION: (primary reason for [place]) MEDICAL NECESSITY: I certify, that based on my findings, NURSING services are a medically necessary home health service. HOME BOUND STATUS: I certify that my clinical findings support that this patient is homebound (i.e., Due to illness or injury, pt requires aid of supportive devices such as crutches, cane, wheelchairs, walkers, the use of special transportation or the assistance of another person to leave their place of residence. There is a normal inability to leave the home and doing so requires considerable and taxing effort. Other absences are for medical reasons / religious services and are infrequent or of short duration when for other reasons). D/C Home Health Services Wound #6 Left Toe Second: [name] TO [name] ENCOUNTER: MEDICARE and MEDICAID PATIENTS: I certify that this patient is under my care and that I had  a [name]-to-[name] encounter that meets the physician [name]-to-[name] encounter requirements with this patient on this date. The encounter with the patient was in whole or in part for the following MEDICAL CONDITION: (primary reason for [place]) MEDICAL NECESSITY: I certify, Dilorenzo, VIGGO PERKO. ([id number]) that based on my findings, NURSING services are a medically necessary home health service. HOME BOUND STATUS: I  certify that my clinical findings support that this patient is homebound (i.e., Due to illness or injury, pt requires aid of supportive devices such as crutches, cane, wheelchairs, walkers, the use of special transportation or the assistance of another person to leave their place of residence. There is a normal inability to leave the home and doing so requires considerable and taxing effort. Other absences are for medical reasons / religious services and are infrequent or of short duration when for other reasons). D/C Home Health Services Wound #7 Left Toe Third: [name] TO [name] ENCOUNTER: MEDICARE and MEDICAID PATIENTS: I certify that this patient is under my care and that I had a [name]-to-[name] encounter that meets the physician [name]-to-[name] encounter requirements with this patient on this date. The encounter with the patient was in whole or in part for the following MEDICAL CONDITION: (primary reason for [place]) MEDICAL NECESSITY: I certify, that based on my findings, NURSING services are a medically necessary home health service. HOME BOUND STATUS: I certify that my clinical findings support that this patient is homebound (i.e., Due to illness or injury, pt requires aid of supportive devices such as crutches, cane, wheelchairs, walkers, the use of special transportation or the assistance of another person to leave their place of residence. There is a normal inability to leave the home and doing so requires considerable and taxing effort. Other absences are for medical reasons / religious services and are infrequent or of short duration when for other reasons). [facility] General Notes: I'm going to recommend that we continue with the Current wound care measures for the next week. We will see him for reevaluation at that point. Overall his wounds do appear to be improving although he has one new location this does not appear to be a severe wound site. If anything worsens  in the interim he will contact the office for additional recommendations. Electronic Signature(s) Signed: 10/15/2016 4:54:08 PM By: Worthy Keeler PA-C Entered By: Worthy Keeler on 10/15/2016 13:15:48 Bitterman, [name] [name] ([id number]) -------------------------------------------------------------------------------- SuperBill Details Patient Name: Mothershead, Juriel E. Date of Service: 10/15/2016 Medical Record Patient Account Number: [id number] Number: Treating RN: Montey Hora Date of Birth/Sex: Aug 13, 1950 (66 y.o. 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 F17.218 Nicotine dependence, cigarettes, 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 abuse with unspecified alcohol-induced disorder Facility Procedures CPT4 Code: 53976734 Description: 19379 - WOUND CARE VISIT-LEV 5 EST PT Modifier: Quantity: 1 Physician Procedures CPT4: Description Modifier Quantity Code [id number] - WC PHYS LEVEL 3 - EST PT 1 ICD-10 Description Diagnosis E11.621 Type 2 diabetes mellitus with foot ulcer 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

## 2016-10-16 NOTE — Progress Notes (Signed)
SAM, OVERBECK ([id number]) Visit Report for 10/15/2016 Arrival Information Details Patient Name: [name], [name] 10/15/2016 12:30 Date of Service: PM Medical Record [id number] Number: Patient Account Number: [id number] Date of Birth/Sex: [birth year] (66 y.o. Male) Treating RN: Elyn Peers, Other Clinician: Primary Care Casen Pryor: Olivia Mackie Treating STONE III, HOYT MCLEAN-SCOCOZZA, Kaithlyn Teagle/Extender: Referring Deepti Gunawan: Harless Nakayama in Treatment: 3 Visit Information History Since Last Visit Added or deleted any medications: No Patient Arrived: Ambulatory Any new allergies or adverse reactions: No Arrival Time: 12:45 Had a fall or experienced change in No Accompanied By: brother activities of daily living that may affect Transfer Assistance: None risk of falls: Patient Identification Verified: Yes Signs or symptoms of abuse/neglect since last No Secondary Verification Process Yes visito Completed: Hospitalized since last visit: No Patient Has Alerts: Yes Has Dressing in Place as Prescribed: Yes Patient Alerts: DMII Pain Present Now: No Electronic Signature(s) Signed: 10/15/2016 5:19:36 PM By: Montey Hora Entered By: Montey Hora on 10/15/2016 12:46:02 Grissinger, [name] ([id number]) -------------------------------------------------------------------------------- Clinic Level of Care Assessment Details Patient Name: [name], [name] 10/15/2016 12:30 Date of Service: PM Medical Record [id number] Number: Patient Account Number: [id number] Date of Birth/Sex: [birth year] (66 y.o. Male) Treating RN: Elyn Peers, Other Clinician: Primary Care Harriet Sutphen: Olivia Mackie Treating STONE III, HOYT MCLEAN-SCOCOZZA, Windle Huebert/Extender: Referring [employer]: Harless Nakayama in Treatment: 3 Clinic Level of Care Assessment Items TOOL 4 Quantity Score []  - Use when only an EandM is performed on FOLLOW-UP visit 0 ASSESSMENTS - Nursing Assessment / Reassessment X -  Reassessment of Co-morbidities (includes updates in patient status) 1 10 X - Reassessment of Adherence to Treatment Plan 1 5 ASSESSMENTS - Wound and Skin Assessment / Reassessment []  - Simple Wound Assessment / Reassessment - one wound 0 X - Complex Wound Assessment / Reassessment - multiple wounds 7 5 []  - Dermatologic / Skin Assessment (not related to wound area) 0 ASSESSMENTS - Focused Assessment []  - Circumferential Edema Measurements - multi extremities 0 []  - Nutritional Assessment / Counseling / Intervention 0 X - Lower Extremity Assessment (monofilament, tuning fork, pulses) 1 5 []  - Peripheral Arterial Disease Assessment (using hand held doppler) 0 ASSESSMENTS - Ostomy and/or Continence Assessment and Care []  - Incontinence Assessment and Management 0 []  - Ostomy Care Assessment and Management (repouching, etc.) 0 PROCESS - Coordination of Care X - Simple Patient / Family Education for ongoing care 1 15 []  - Complex (extensive) Patient / Family Education for ongoing care 0 []  - Staff obtains Consents, Records, Test Results / Process Orders 0 Sidell, Cheryl E. ([id number]) []  - Staff telephones HHA, Nursing Homes / Clarify orders / etc 0 []  - Routine Transfer to another Facility (non-emergent condition) 0 []  - Routine Hospital Admission (non-emergent condition) 0 []  - New Admissions / Biomedical engineer / Ordering NPWT, Apligraf, etc. 0 []  - Emergency Hospital Admission (emergent condition) 0 X - Simple Discharge Coordination 1 10 []  - Complex (extensive) Discharge Coordination 0 PROCESS - Special Needs []  - Pediatric / Minor Patient Management 0 []  - Isolation Patient Management 0 []  - Hearing / Language / Visual special needs 0 []  - Assessment of Community assistance (transportation, D/C planning, etc.) 0 []  - Additional assistance / Altered mentation 0 []  - Support Surface(s) Assessment (bed, cushion, seat, etc.) 0 INTERVENTIONS - Wound Cleansing / Measurement []  -  Simple Wound Cleansing - one wound 0 X - Complex Wound Cleansing - multiple wounds 7 5 X - Wound Imaging (photographs - any number of wounds) 1 5 []  -  Wound Tracing (instead of photographs) 0 []  - Simple Wound Measurement - one wound 0 X - Complex Wound Measurement - multiple wounds 7 5 INTERVENTIONS - Wound Dressings X - Small Wound Dressing one or multiple wounds 7 10 []  - Medium Wound Dressing one or multiple wounds 0 []  - Large Wound Dressing one or multiple wounds 0 []  - Application of Medications - topical 0 []  - Application of Medications - injection 0 Hartshorne, Caisen E. ([id number]) INTERVENTIONS - Miscellaneous []  - External ear exam 0 []  - Specimen Collection (cultures, biopsies, blood, body fluids, etc.) 0 []  - Specimen(s) / Culture(s) sent or taken to Lab for analysis 0 []  - Patient Transfer (multiple staff / Harrel Lemon Lift / Similar devices) 0 []  - Simple Staple / Suture removal (25 or less) 0 []  - Complex Staple / Suture removal (26 or more) 0 []  - Hypo / Hyperglycemic Management (close monitor of Blood Glucose) 0 []  - Ankle / Brachial Index (ABI) - do not check if billed separately 0 X - Vital Signs 1 5 Has the patient been seen at the hospital within the last three years: Yes Total Score: 230 Level Of Care: New/Established - Level 5 Electronic Signature(s) Signed: 10/15/2016 5:19:36 PM By: Montey Hora Entered By: Montey Hora on 10/15/2016 14:32:52 Kaestner, [name] ([id number]) -------------------------------------------------------------------------------- Encounter Discharge Information Details Patient Name: [name], [name] 10/15/2016 12:30 Date of Service: PM Medical Record [id number] Number: Patient Account Number: [id number] Date of Birth/Sex: 02/11/1951 (66 y.o. Male) Treating RN: Elyn Peers, Other Clinician: Primary Care Kearie Mennen: TRACY Treating STONE III, Charlynn Grimes, Angeliah Wisdom/Extender: Referring Jovi Zavadil: Harless Nakayama in  Treatment: 3 Encounter Discharge Information Items Discharge Pain Level: 0 Discharge Condition: Stable Ambulatory Status: Ambulatory Discharge Destination: Home Transportation: Private Auto Accompanied By: brother Schedule Follow-up Appointment: Yes Medication Reconciliation completed and provided to Patient/Care No Franklin Clapsaddle: Provided on Clinical Summary of Care: 10/15/2016 Form Type Recipient Paper Patient ED Electronic Signature(s) Signed: 10/15/2016 1:22:21 PM By: Ruthine Dose Entered By: Ruthine Dose on 10/15/2016 13:22:20 Facundo, [name] ([id number]) -------------------------------------------------------------------------------- Lower Extremity Assessment Details Patient Name: SAMIK, BALKCOM 10/15/2016 12:30 Date of Service: PM Medical Record [id number] Number: Patient Account Number: [id number] Date of Birth/Sex: 1950/05/31 (66 y.o. Male) Treating RN: Elyn Peers, Other Clinician: Primary Care Maddix Kliewer: Olivia Mackie Treating STONE III, Charlynn Grimes, Andrell Bergeson/Extender: Referring Peityn Payton: Harless Nakayama in Treatment: 3 Vascular Assessment Pulses: Dorsalis Pedis Palpable: [Left:Yes] [Right:Yes] Posterior Tibial Extremity colors, hair growth, and conditions: Extremity Color: [Left:Hyperpigmented] [Right:Hyperpigmented] Hair Growth on Extremity: [Left:No] [Right:No] Temperature of Extremity: [Left:Warm] [Right:Warm] Capillary Refill: [Left:< 3 seconds] [Right:< 3 seconds] Electronic Signature(s) Signed: 10/15/2016 5:19:36 PM By: Montey Hora Entered By: Montey Hora on 10/15/2016 12:54:13 [place], [name] ([id number]) -------------------------------------------------------------------------------- Multi Wound Chart Details Patient Name: [name] 10/15/2016 12:30 Date of Service: PM Medical Record [id number] Number: Patient Account Number: [id number] Date of Birth/Sex: Jun 15, 1950 (66 y.o. Male) Treating RN: Elyn Peers,  Other Clinician: Primary Care Angelly Spearing: TRACY Treating STONE III, HOYT MCLEAN-SCOCOZZA, Cally Nygard/Extender: Referring Titianna Loomis: Harless Nakayama in Treatment: 3 Vital Signs Height(in): 69 Pulse(bpm): 63 Weight(lbs): 168 Blood Pressure 141/73 (mmHg): Body Mass Index(BMI): 25 Temperature(F): 98.0 Respiratory Rate 16 (breaths/min): Photos: [1:No Photos] [2:No Photos] [3:No Photos] Wound Location: [1:Right Toe Great] [2:Right Toe Second] [3:Right Toe Third] Wounding Event: [1:Gradually Appeared] [2:Gradually Appeared] [3:Gradually Appeared] Primary Etiology: [1:Diabetic Wound/Ulcer of the Lower Extremity] [2:Diabetic Wound/Ulcer of the Lower Extremity] [3:Diabetic Wound/Ulcer of the Lower Extremity] Secondary Etiology: [1:N/A] [2:N/A] [3:N/A] Comorbid History: [1:Anemia, Lymphedema, Congestive Heart Failure,  Hypertension, Peripheral Venous Disease, Type II Diabetes, Neuropathy] [2:Anemia, Lymphedema, Congestive Heart Failure, Hypertension, Peripheral Venous Disease, Type II Diabetes,  Neuropathy] [3:Anemia, Lymphedema, Congestive Heart Failure, Hypertension, Peripheral Venous Disease, Type II Diabetes, Neuropathy] Date Acquired: [1:06/11/2016] [2:06/11/2016] [3:06/11/2016] Weeks of Treatment: [1:3] [2:3] [3:3] Wound Status: [1:Open] [2:Open] [3:Open] Pending Amputation on Yes [2:Yes] [3:Yes] Presentation: Measurements L x W x D 7.5x6.5x0.1 [2:0.9x1.1x0.1] [3:0.5x0.7x0.1] (cm) Area (cm) : [1:38.288] [2:0.778] [3:0.275] Volume (cm) : [1:3.829] [2:0.078] [3:0.027] % Reduction in Area: [1:18.80%] [2:74.70%] [3:45.30%] % Reduction in Volume: 18.70% [2:74.60%] [3:46.00%] Classification: [1:Grade 1] [2:Grade 1] [3:Grade 1] Exudate Amount: [1:Large] [2:Large] [3:Large] Exudate Type: [1:Serous] [2:Serous] [3:Serous] Exudate Color: [1:amber Yes] [2:amber Yes] [3:amber Yes] Foul Odor After Cleansing: Odor Anticipated Due to No No No Product Use: Wound Margin: Flat and Intact Flat and Intact  Flat and Intact Granulation Amount: Large (67-100%) Medium (34-66%) Large (67-100%) Granulation Quality: Pink Pink Pink Necrotic Amount: Small (1-33%) Medium (34-66%) Small (1-33%) Exposed Structures: Fat Layer (Subcutaneous Fat Layer (Subcutaneous Fat Layer (Subcutaneous Tissue) Exposed: Yes Tissue) Exposed: Yes Tissue) Exposed: Yes Fascia: No Fascia: No Fascia: No Tendon: No Tendon: No Tendon: No Muscle: No Muscle: No Muscle: No Joint: No Joint: No Joint: No Bone: No Bone: No Bone: No Epithelialization: Large (67-100%) Small (1-33%) Small (1-33%) Periwound Skin Texture: Excoriation: Yes Excoriation: Yes Excoriation: Yes Induration: No Induration: No Induration: No Callus: No Callus: No Callus: No Crepitus: No Crepitus: No Crepitus: No Rash: No Rash: No Rash: No Scarring: No Scarring: No Scarring: No Periwound Skin Maceration: Yes Maceration: Yes Maceration: Yes Moisture: Dry/Scaly: No Dry/Scaly: No Dry/Scaly: No Periwound Skin Color: Atrophie Blanche: No Atrophie Blanche: No Atrophie Blanche: No Cyanosis: No Cyanosis: No Cyanosis: No Ecchymosis: No Ecchymosis: No Ecchymosis: No Erythema: No Erythema: No Erythema: No Hemosiderin Staining: No Hemosiderin Staining: No Hemosiderin Staining: No Mottled: No Mottled: No Mottled: No Pallor: No Pallor: No Pallor: No Rubor: No Rubor: No Rubor: No Temperature: No Abnormality No Abnormality No Abnormality Tenderness on No No No Palpation: Wound Preparation: Ulcer Cleansing: Ulcer Cleansing: Ulcer Cleansing: Rinsed/Irrigated with Rinsed/Irrigated with Rinsed/Irrigated with Saline Saline Saline Topical Anesthetic Topical Anesthetic Topical Anesthetic Applied: Other: lidocaine Applied: Other: lidocaine Applied: Other: lidocaine 4% 4% 4% Wound Number: [id number] [phone]  Photos: No Photos No Photos No Photos Wound Location: Right Foot - Dorsal Left Toe Great Left Toe Second Wounding Event: Gradually Appeared  Gradually Appeared Gradually Appeared Primary 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 the Lower Extremity the Lower Extremity Secondary Etiology: N/A N/A N/A QUNICY, HIGINBOTHAM ([id number]) Comorbid History: Anemia, Lymphedema, Anemia, Lymphedema, Anemia, Lymphedema, Congestive Heart Failure, Congestive Heart Failure, Congestive Heart Failure, Hypertension, Peripheral Hypertension, Peripheral Hypertension, Peripheral Venous Disease, Type II Venous Disease, Type II Venous Disease, Type II Diabetes, Neuropathy Diabetes, Neuropathy Diabetes, Neuropathy Date Acquired: 06/11/2016 06/11/2016 06/11/2016 Weeks of Treatment: 3 3 3  Wound Status: Open Open Open Pending Amputation on Yes Yes Yes Presentation: Measurements L x W x D 2.3x2.5x0.1 2x3.2x0.1 0.9x1.1x0.1 (cm) Area (cm) : 4.516 5.027 0.778 Volume (cm) : 0.452 0.503 0.078 % Reduction in Area: -1.40% 64.80% 63.60% % Reduction in Volume: -1.60% 64.80% 63.60% Classification: Grade 1 Grade 1 Grade 1 Exudate Amount: Large Large Large Exudate Type: Serous Serous Serous Exudate Color: amber amber amber Foul Odor After Yes Yes Yes Cleansing: Odor Anticipated Due to No No No Product Use: Wound Margin: Flat and Intact Flat and Intact Flat and Intact Granulation Amount: Medium (34-66%) Medium (34-66%) Medium (34-66%)  Granulation Quality: Pink Pink Pink Necrotic Amount: Medium (34-66%) Medium (34-66%) Medium (34-66%) Exposed Structures: Fat Layer (Subcutaneous Fat Layer (Subcutaneous Fat Layer (Subcutaneous Tissue) Exposed: Yes Tissue) Exposed: Yes Tissue) Exposed: Yes Fascia: No Fascia: No Fascia: No Tendon: No Tendon: No Tendon: No Muscle: No Muscle: No Muscle: No Joint: No Joint: No Joint: No Bone: No Bone: No Bone: No Epithelialization: Small (1-33%) Small (1-33%) Small (1-33%) Periwound Skin Texture: Excoriation: Yes Excoriation: Yes Excoriation: Yes Induration:  No Induration: No Induration: No Callus: No Callus: No Callus: No Crepitus: No Crepitus: No Crepitus: No Rash: No Rash: No Rash: No Scarring: No Scarring: No Scarring: No Periwound Skin Maceration: Yes Maceration: Yes Maceration: Yes Moisture: Dry/Scaly: No Dry/Scaly: No Dry/Scaly: No Periwound Skin Color: Atrophie Blanche: No Atrophie Blanche: No Atrophie Blanche: No Cyanosis: No Cyanosis: No Cyanosis: No Ecchymosis: No Ecchymosis: No Ecchymosis: No Erythema: No Erythema: No Erythema: No Hemosiderin Staining: No Hemosiderin Staining: No Hemosiderin Staining: No Mottled: No Mottled: No Mottled: No Krakow, Kjell E. ([id number]) Pallor: No Pallor: No Pallor: No Rubor: No Rubor: No Rubor: No Temperature: No Abnormality No Abnormality No Abnormality Tenderness on No No No Palpation: Wound Preparation: Ulcer Cleansing: Ulcer Cleansing: Ulcer Cleansing: Rinsed/Irrigated with Rinsed/Irrigated with Rinsed/Irrigated with Saline Saline Saline Topical Anesthetic Topical Anesthetic Topical Anesthetic Applied: Other: lidocaine Applied: Other: lidocaine Applied: Other: lidocaine 4% 4% 4% Wound Number: 7 N/A N/A Photos: No Photos N/A N/A Wound Location: Left Toe Third N/A N/A Wounding Event: Gradually Appeared N/A N/A Primary Etiology: Diabetic Wound/Ulcer of N/A N/A the Lower Extremity Secondary Etiology: Pressure Ulcer N/A N/A Comorbid History: Anemia, Lymphedema, N/A N/A Congestive Heart Failure, Hypertension, Peripheral Venous Disease, Type II Diabetes, Neuropathy Date Acquired: 10/14/2016 N/A N/A Weeks of Treatment: 0 N/A N/A Wound Status: Open N/A N/A Pending Amputation on No N/A N/A Presentation: Measurements L x W x D 1.3x1.5x0.1 N/A N/A (cm) Area (cm) : 1.532 N/A N/A Volume (cm) : 0.153 N/A N/A % Reduction in Area: N/A N/A N/A % Reduction in Volume: N/A N/A N/A Classification: Grade 1 N/A N/A Exudate Amount: Large N/A N/A Exudate Type: Serous  N/A N/A Exudate Color: amber N/A N/A Foul Odor After No N/A N/A Cleansing: Odor Anticipated Due to N/A N/A N/A Product Use: Wound Margin: Flat and Intact N/A N/A Granulation Amount: None Present (0%) N/A N/A Granulation Quality: N/A N/A N/A Necrotic Amount: Large (67-100%) N/A N/A Exposed Structures: N/A N/A Kenan, Finnley E. ([id number]) Fat Layer (Subcutaneous Tissue) Exposed: Yes Fascia: No Tendon: No Muscle: No Joint: No Bone: No Epithelialization: None N/A N/A Periwound Skin Texture: Excoriation: Yes N/A N/A Induration: No Callus: No Crepitus: No Rash: No Scarring: No Periwound Skin Maceration: No N/A N/A Moisture: Dry/Scaly: No Periwound Skin Color: Atrophie Blanche: No N/A N/A Cyanosis: No Ecchymosis: No Erythema: No Hemosiderin Staining: No Mottled: No Pallor: No Rubor: No Temperature: N/A N/A N/A Tenderness on No N/A N/A Palpation: Wound Preparation: Ulcer Cleansing: N/A N/A Rinsed/Irrigated with Saline Topical Anesthetic Applied: Other: lidocaine 4% Treatment Notes Electronic Signature(s) Signed: 10/15/2016 5:19:36 PM By: Montey Hora Entered By: Montey Hora on 10/15/2016 13:01:12 Korell, CONLIN BRAHM ([id number]) -------------------------------------------------------------------------------- [place] Details Patient Name: RAYLEN, TANGONAN 10/15/2016 12:30 Date of Service: PM Medical Record [id number] Number: Patient Account Number: [id number] Date of Birth/Sex: 1950/05/20 (66 y.o. Male) Treating RN: Elyn Peers, Other Clinician: Primary Care Lavanda Nevels: TRACY Treating STONE III, Charlynn Grimes, Raidon Swanner/Extender: Referring Jeanmarie Mccowen: Harless Nakayama in Treatment: 3 Active Inactive ` Abuse / Safety / Falls / Self  Care Management Nursing Diagnoses: Potential for falls Goals: Patient will remain injury free related to falls Date Initiated: 09/21/2016 Target Resolution Date: 11/30/2016 Goal Status:  Active Interventions: Assess fall risk on admission and as needed Notes: ` Nutrition Nursing Diagnoses: Potential for alteratiion in Nutrition/Potential for imbalanced nutrition Goals: Patient/caregiver agrees to and verbalizes understanding of need to use nutritional supplements and/or vitamins as prescribed Date Initiated: 09/21/2016 Target Resolution Date: 11/30/2016 Goal Status: Active Interventions: Assess patient nutrition upon admission and as needed per policy Notes: ` Orientation to the [facility]. ([id number]) Nursing Diagnoses: Knowledge deficit related to the wound healing center program Goals: Patient/caregiver will verbalize understanding of the [facility] Date Initiated: 09/21/2016 Target Resolution Date: 11/30/2016 Goal Status: Active Interventions: Provide education on orientation to the wound center Notes: ` Wound/Skin Impairment Nursing Diagnoses: Knowledge deficit related to smoking impact on wound healing Goals: Ulcer/skin breakdown will have a volume reduction of 30% by week 4 Date Initiated: 09/21/2016 Target Resolution Date: 11/30/2016 Goal Status: Active Ulcer/skin breakdown will have a volume reduction of 50% by week 8 Date Initiated: 09/21/2016 Target Resolution Date: 11/30/2016 Goal Status: Active Ulcer/skin breakdown will have a volume reduction of 80% by week 12 Date Initiated: 09/21/2016 Target Resolution Date: 11/30/2016 Goal Status: Active Ulcer/skin breakdown will heal within 14 weeks Date Initiated: 09/21/2016 Target Resolution Date: 11/30/2016 Goal Status: Active Interventions: Assess patient/caregiver ability to obtain necessary supplies Assess patient/caregiver ability to perform ulcer/skin care regimen upon admission and as needed Assess ulceration(s) every visit Notes: Electronic Signature(s) Signed: 10/15/2016 5:19:36 PM By: Montey Hora Entered By: Montey Hora on 10/15/2016  13:01:01 Loeffelholz, [name] ([id number]) -------------------------------------------------------------------------------- Pain Assessment Details Patient Name: JOVANIE, VERGE 10/15/2016 12:30 Date of Service: PM Medical Record [id number] Number: Patient Account Number: [id number] Date of Birth/Sex: [birth year] (66 y.o. Male) Treating RN: Elyn Peers, Other Clinician: Primary Care Mahati Vajda: Olivia Mackie Treating STONE III, Charlynn Grimes, Breyon Sigg/Extender: Referring [place]: Harless Nakayama in Treatment: 3 Active Problems Location of Pain Severity and Description of Pain Patient Has Paino No Site Locations Pain Management and Medication Current Pain Management: Notes Topical or injectable lidocaine is offered to patient for acute pain when surgical debridement is performed. If needed, Patient is instructed to use over the counter pain medication for the following 24-48 hours after debridement. Wound care MDs do not prescribed pain medications. Patient has chronic pain or uncontrolled pain. Patient has been instructed to make an appointment with their Primary Care Physician for pain management. Electronic Signature(s) Signed: 10/15/2016 5:19:36 PM By: Montey Hora Entered By: Montey Hora on 10/15/2016 12:46:16 [place]lock, [name] ([id number]) -------------------------------------------------------------------------------- Patient/Caregiver Education Details Patient Name: NARCISO, STOUTENBURG 10/15/2016 12:30 Date of Service: PM Medical Record [id number] Number: Patient Account Number: [id number] Date of Birth/Gender: 1950-04-12 (66 y.o. Male) Treating RN: Elyn Peers, Other Clinician: Primary Care Physician: Olivia Mackie Treating STONE III, Charlynn Grimes, Physician/Extender: Referring Physician: Harless Nakayama in Treatment: 3 Education Assessment Education Provided To: Patient and Caregiver Education Topics Provided Wound/Skin  Impairment: Handouts: Other: wound care as ordered Methods: Demonstration, Explain/Verbal Responses: State content correctly Electronic Signature(s) Signed: 10/15/2016 5:19:36 PM By: Montey Hora Entered By: Montey Hora on 10/15/2016 13:06:06 Caplin, [name] ([id number]) -------------------------------------------------------------------------------- Wound Assessment Details Patient Name: KENDRE, JACINTO 10/15/2016 12:30 Date of Service: PM Medical Record [id number] Number: Patient Account Number: [id number] Date of Birth/Sex: [birth year] (66 y.o. Male) Treating RN: Elyn Peers, Other Clinician: Primary Care Jesse Nosbisch: TRACY Treating STONE III, Charlynn Grimes, Asucena Galer/Extender: Referring Daelin Haste:  TRACY Weeks in Treatment: 3 Wound Status Wound Number: 1 Primary Diabetic Wound/Ulcer of the Lower Etiology: Extremity Wound Location: Right Toe Great Wound Open Wounding Event: Gradually Appeared Status: Date Acquired: 06/11/2016 Comorbid Anemia, Lymphedema, Congestive Weeks Of Treatment: 3 History: Heart Failure, Hypertension, Peripheral Clustered Wound: No Venous Disease, Type II Diabetes, Pending Amputation On Presentation Neuropathy Photos Photo Uploaded By: Montey Hora on 10/15/2016 13:50:40 Wound Measurements Length: (cm) 7.5 Width: (cm) 6.5 Depth: (cm) 0.1 Area: (cm) 38.288 Volume: (cm) 3.829 % Reduction in Area: 18.8% % Reduction in Volume: 18.7% Epithelialization: Large (67-100%) Tunneling: No Undermining: No Wound Description Classification: Grade 1 Foul Odor After Wound Margin: Flat and Intact Due to Product Exudate Amount: Large Slough/Fibrino Exudate Type: Serous Exudate Color: amber Cleansing: Yes Use: No Yes Wound Bed Dassow, Dona E. ([id number]) Granulation Amount: Large (67-100%) Exposed Structure Granulation Quality: Pink Fascia Exposed: No Necrotic Amount: Small (1-33%) Fat Layer (Subcutaneous Tissue)  Exposed: Yes Necrotic Quality: Adherent Slough Tendon Exposed: No Muscle Exposed: No Joint Exposed: No Bone Exposed: No Periwound Skin Texture Texture Color No Abnormalities Noted: No No Abnormalities Noted: No Callus: No Atrophie Blanche: No Crepitus: No Cyanosis: No Excoriation: Yes Ecchymosis: No Induration: No Erythema: No Rash: No Hemosiderin Staining: No Scarring: No Mottled: No Pallor: No Moisture Rubor: No No Abnormalities Noted: No Dry / Scaly: No Temperature / Pain Maceration: Yes Temperature: No Abnormality Wound Preparation Ulcer Cleansing: Rinsed/Irrigated with Saline Topical Anesthetic Applied: Other: lidocaine 4%, Treatment Notes Wound #1 (Right Toe Great) 1. Cleansed with: Clean wound with Normal Saline 4. Dressing Applied: Aquacel Ag 5. Secondary Dressing Applied Gauze and Kerlix/Conform 7. Secured with Tape Notes netting Electronic Signature(s) Signed: 10/15/2016 5:19:36 PM By: Montey Hora Entered By: Montey Hora on 10/15/2016 12:47:39 Naas, [name] ([id number]) -------------------------------------------------------------------------------- Wound Assessment Details Patient Name: CHARLEY, MISKE 10/15/2016 12:30 Date of Service: PM Medical Record [id number] Number: Patient Account Number: [id number] Date of Birth/Sex: [birth year] (66 y.o. Male) Treating RN: Elyn Peers, Other Clinician: Primary Care Vianne Grieshop: TRACY Treating STONE III, HOYT MCLEAN-SCOCOZZA, Keiron Iodice/Extender: Referring Dmarion Perfect: Harless Nakayama in Treatment: 3 Wound Status Wound Number: 2 Primary Diabetic Wound/Ulcer of the Lower Etiology: Extremity Wound Location: Right Toe Second Wound Open Wounding Event: Gradually Appeared Status: Date Acquired: 06/11/2016 Comorbid Anemia, Lymphedema, Congestive Weeks Of Treatment: 3 History: Heart Failure, Hypertension, Peripheral Clustered Wound: No Venous Disease, Type II Diabetes, Pending Amputation  On Presentation Neuropathy Photos Photo Uploaded By: Montey Hora on 10/15/2016 13:50:09 Wound Measurements Length: (cm) 0.9 Width: (cm) 1.1 Depth: (cm) 0.1 Area: (cm) 0.778 Volume: (cm) 0.078 % Reduction in Area: 74.7% % Reduction in Volume: 74.6% Epithelialization: Small (1-33%) Tunneling: No Undermining: No Wound Description Classification: Grade 1 Foul Odor After Wound Margin: Flat and Intact Due to Product Exudate Amount: Large Slough/Fibrino Exudate Type: Serous Exudate Color: amber Cleansing: Yes Use: No Yes Wound Bed Merritts, Carolos E. ([id number]) Granulation Amount: Medium (34-66%) Exposed Structure Granulation Quality: Pink Fascia Exposed: No Necrotic Amount: Medium (34-66%) Fat Layer (Subcutaneous Tissue) Exposed: Yes Necrotic Quality: Adherent Slough Tendon Exposed: No Muscle Exposed: No Joint Exposed: No Bone Exposed: No Periwound Skin Texture Texture Color No Abnormalities Noted: No No Abnormalities Noted: No Callus: No Atrophie Blanche: No Crepitus: No Cyanosis: No Excoriation: Yes Ecchymosis: No Induration: No Erythema: No Rash: No Hemosiderin Staining: No Scarring: No Mottled: No Pallor: No Moisture Rubor: No No Abnormalities Noted: No Dry / Scaly: No Temperature / Pain Maceration: Yes Temperature: No Abnormality Wound Preparation Ulcer Cleansing: Rinsed/Irrigated with Saline Topical Anesthetic  Applied: Other: lidocaine 4%, Treatment Notes Wound #2 (Right Toe Second) 1. Cleansed with: Clean wound with Normal Saline 4. Dressing Applied: Aquacel Ag 5. Secondary Dressing Applied Gauze and Kerlix/Conform 7. Secured with Tape Notes netting Electronic Signature(s) Signed: 10/15/2016 5:19:36 PM By: Montey Hora Entered By: Montey Hora on 10/15/2016 12:48:19 Wheless, [name] ([id number]) -------------------------------------------------------------------------------- Wound Assessment Details Patient Name: DASEAN, BROW  10/15/2016 12:30 Date of Service: PM Medical Record [id number] Number: Patient Account Number: [id number] Date of Birth/Sex: May 10, 1950 (66 y.o. Male) Treating RN: Elyn Peers, Other Clinician: Primary Care Shellye Zandi: TRACY Treating STONE III, HOYT MCLEAN-SCOCOZZA, Trinton Prewitt/Extender: Referring Arionna Hoggard: Harless Nakayama in Treatment: 3 Wound Status Wound Number: 3 Primary Diabetic Wound/Ulcer of the Lower Etiology: Extremity Wound Location: Right Toe Third Wound Open Wounding Event: Gradually Appeared Status: Date Acquired: 06/11/2016 Comorbid Anemia, Lymphedema, Congestive Weeks Of Treatment: 3 History: Heart Failure, Hypertension, Peripheral Clustered Wound: No Venous Disease, Type II Diabetes, Pending Amputation On Presentation Neuropathy Photos Photo Uploaded By: Montey Hora on 10/15/2016 13:51:12 Wound Measurements Length: (cm) 0.5 Width: (cm) 0.7 Depth: (cm) 0.1 Area: (cm) 0.275 Volume: (cm) 0.027 % Reduction in Area: 45.3% % Reduction in Volume: 46% Epithelialization: Small (1-33%) Tunneling: No Undermining: No Wound Description Classification: Grade 1 Foul Odor After Wound Margin: Flat and Intact Due to Product Exudate Amount: Large Slough/Fibrino Exudate Type: Serous Exudate Color: amber Cleansing: Yes Use: No Yes Wound Bed Voyles, Daxen E. ([id number]) Granulation Amount: Large (67-100%) Exposed Structure Granulation Quality: Pink Fascia Exposed: No Necrotic Amount: Small (1-33%) Fat Layer (Subcutaneous Tissue) Exposed: Yes Necrotic Quality: Adherent Slough Tendon Exposed: No Muscle Exposed: No Joint Exposed: No Bone Exposed: No Periwound Skin Texture Texture Color No Abnormalities Noted: No No Abnormalities Noted: No Callus: No Atrophie Blanche: No Crepitus: No Cyanosis: No Excoriation: Yes Ecchymosis: No Induration: No Erythema: No Rash: No Hemosiderin Staining: No Scarring: No Mottled: No Pallor:  No Moisture Rubor: No No Abnormalities Noted: No Dry / Scaly: No Temperature / Pain Maceration: Yes Temperature: No Abnormality Wound Preparation Ulcer Cleansing: Rinsed/Irrigated with Saline Topical Anesthetic Applied: Other: lidocaine 4%, Treatment Notes Wound #3 (Right Toe Third) 1. Cleansed with: Clean wound with Normal Saline 4. Dressing Applied: Aquacel Ag 5. Secondary Dressing Applied Gauze and Kerlix/Conform 7. Secured with Tape Notes netting Electronic Signature(s) Signed: 10/15/2016 5:19:36 PM By: Montey Hora Entered By: Montey Hora on 10/15/2016 12:49:09 Krock, [name] ([id number]) -------------------------------------------------------------------------------- Wound Assessment Details Patient Name: EMIGDIO, WILDEMAN 10/15/2016 12:30 Date of Service: PM Medical Record [id number] Number: Patient Account Number: [id number] Date of Birth/Sex: [birth year] (66 y.o. Male) Treating RN: Elyn Peers, Other Clinician: Primary Care Konya Fauble: TRACY Treating STONE III, HOYT MCLEAN-SCOCOZZA, Hamed Debella/Extender: Referring Raechel Marcos: Harless Nakayama in Treatment: 3 Wound Status Wound Number: 4 Primary Diabetic Wound/Ulcer of the Lower Etiology: Extremity Wound Location: Right Foot - Dorsal Wound Open Wounding Event: Gradually Appeared Status: Date Acquired: 06/11/2016 Comorbid Anemia, Lymphedema, Congestive Weeks Of Treatment: 3 History: Heart Failure, Hypertension, Peripheral Clustered Wound: No Venous Disease, Type II Diabetes, Pending Amputation On Presentation Neuropathy Photos Photo Uploaded By: Montey Hora on 10/15/2016 13:51:12 Wound Measurements Length: (cm) 2.3 Width: (cm) 2.5 Depth: (cm) 0.1 Area: (cm) 4.516 Volume: (cm) 0.452 % Reduction in Area: -1.4% % Reduction in Volume: -1.6% Epithelialization: Small (1-33%) Tunneling: No Undermining: No Wound Description Classification: Grade 1 Foul Odor After Wound Margin: Flat and  Intact Due to Product Exudate Amount: Large Slough/Fibrino Exudate Type: Serous Exudate Color: amber Cleansing: Yes Use: No Yes Wound Bed Hollinger, Imari E. (  [id number]) Granulation Amount: Medium (34-66%) Exposed Structure Granulation Quality: Pink Fascia Exposed: No Necrotic Amount: Medium (34-66%) Fat Layer (Subcutaneous Tissue) Exposed: Yes Necrotic Quality: Adherent Slough Tendon Exposed: No Muscle Exposed: No Joint Exposed: No Bone Exposed: No Periwound Skin Texture Texture Color No Abnormalities Noted: No No Abnormalities Noted: No Callus: No Atrophie Blanche: No Crepitus: No Cyanosis: No Excoriation: Yes Ecchymosis: No Induration: No Erythema: No Rash: No Hemosiderin Staining: No Scarring: No Mottled: No Pallor: No Moisture Rubor: No No Abnormalities Noted: No Dry / Scaly: No Temperature / Pain Maceration: Yes Temperature: No Abnormality Wound Preparation Ulcer Cleansing: Rinsed/Irrigated with Saline Topical Anesthetic Applied: Other: lidocaine 4%, Treatment Notes Wound #4 (Right, Dorsal Foot) 1. Cleansed with: Clean wound with Normal Saline 4. Dressing Applied: Aquacel Ag 5. Secondary Dressing Applied Gauze and Kerlix/Conform 7. Secured with Tape Notes netting Electronic Signature(s) Signed: 10/15/2016 5:19:36 PM By: Montey Hora Entered By: Montey Hora on 10/15/2016 12:49:59 Buttery, [name] ([id number]) -------------------------------------------------------------------------------- Wound Assessment Details Patient Name: HULEN, MANDLER 10/15/2016 12:30 Date of Service: PM Medical Record [id number] Number: Patient Account Number: [id number] Date of Birth/Sex: [birth year] (66 y.o. Male) Treating RN: Elyn Peers, Other Clinician: Primary Care Judy Goodenow: TRACY Treating STONE III, HOYT MCLEAN-SCOCOZZA, Velora Horstman/Extender: Referring Daksha Koone: Harless Nakayama in Treatment: 3 Wound Status Wound Number: 5 Primary Diabetic  Wound/Ulcer of the Lower Etiology: Extremity Wound Location: Left Toe Great Wound Open Wounding Event: Gradually Appeared Status: Date Acquired: 06/11/2016 Comorbid Anemia, Lymphedema, Congestive Weeks Of Treatment: 3 History: Heart Failure, Hypertension, Peripheral Clustered Wound: No Venous Disease, Type II Diabetes, Pending Amputation On Presentation Neuropathy Photos Photo Uploaded By: Montey Hora on 10/15/2016 13:51:54 Wound Measurements Length: (cm) 2 Width: (cm) 3.2 Depth: (cm) 0.1 Area: (cm) 5.027 Volume: (cm) 0.503 % Reduction in Area: 64.8% % Reduction in Volume: 64.8% Epithelialization: Small (1-33%) Tunneling: No Undermining: No Wound Description Classification: Grade 1 Foul Odor After Wound Margin: Flat and Intact Due to Product Exudate Amount: Large Slough/Fibrino Exudate Type: Serous Exudate Color: amber Cleansing: Yes Use: No Yes Wound Bed Chunn, Ezana E. ([id number]) Granulation Amount: Medium (34-66%) Exposed Structure Granulation Quality: Pink Fascia Exposed: No Necrotic Amount: Medium (34-66%) Fat Layer (Subcutaneous Tissue) Exposed: Yes Necrotic Quality: Adherent Slough Tendon Exposed: No Muscle Exposed: No Joint Exposed: No Bone Exposed: No Periwound Skin Texture Texture Color No Abnormalities Noted: No No Abnormalities Noted: No Callus: No Atrophie Blanche: No Crepitus: No Cyanosis: No Excoriation: Yes Ecchymosis: No Induration: No Erythema: No Rash: No Hemosiderin Staining: No Scarring: No Mottled: No Pallor: No Moisture Rubor: No No Abnormalities Noted: No Dry / Scaly: No Temperature / Pain Maceration: Yes Temperature: No Abnormality Wound Preparation Ulcer Cleansing: Rinsed/Irrigated with Saline Topical Anesthetic Applied: Other: lidocaine 4%, Treatment Notes Wound #5 (Left Toe Great) 1. Cleansed with: Clean wound with Normal Saline 4. Dressing Applied: Aquacel Ag 5. Secondary Dressing Applied Gauze and  Kerlix/Conform 7. Secured with Tape Notes netting Electronic Signature(s) Signed: 10/15/2016 5:19:36 PM By: Montey Hora Entered By: Montey Hora on 10/15/2016 12:50:58 Lauter, ARIF AMENDOLA ([id number]) -------------------------------------------------------------------------------- Wound Assessment Details Patient Name: COMMODORE, BELLEW 10/15/2016 12:30 Date of Service: PM Medical Record [id number] Number: Patient Account Number: [id number] Date of Birth/Sex: [birth year] (66 y.o. Male) Treating RN: Elyn Peers, Other Clinician: Primary Care Gianny Killman: Olivia Mackie Treating STONE III, Charlynn Grimes, Amour Cutrone/Extender: Referring Sadako Cegielski: Harless Nakayama in Treatment: 3 Wound Status Wound Number: 6 Primary Diabetic Wound/Ulcer of the Lower Etiology: Extremity Wound Location: Left Toe Second Wound Open Wounding Event: Gradually Appeared Status: Date Acquired:  06/11/2016 Comorbid Anemia, Lymphedema, Congestive Weeks Of Treatment: 3 History: Heart Failure, Hypertension, Peripheral Clustered Wound: No Venous Disease, Type II Diabetes, Pending Amputation On Presentation Neuropathy Photos Photo Uploaded By: Montey Hora on 10/15/2016 13:52:27 Wound Measurements Length: (cm) 0.9 Width: (cm) 1.1 Depth: (cm) 0.1 Area: (cm) 0.778 Volume: (cm) 0.078 % Reduction in Area: 63.6% % Reduction in Volume: 63.6% Epithelialization: Small (1-33%) Tunneling: No Undermining: No Wound Description Classification: Grade 1 Foul Odor After Wound Margin: Flat and Intact Due to Product Exudate Amount: Large Slough/Fibrino Exudate Type: Serous Exudate Color: amber Cleansing: Yes Use: No Yes Wound Bed Maravilla, Asir E. ([id number]) Granulation Amount: Medium (34-66%) Exposed Structure Granulation Quality: Pink Fascia Exposed: No Necrotic Amount: Medium (34-66%) Fat Layer (Subcutaneous Tissue) Exposed: Yes Necrotic Quality: Adherent Slough Tendon Exposed: No Muscle  Exposed: No Joint Exposed: No Bone Exposed: No Periwound Skin Texture Texture Color No Abnormalities Noted: No No Abnormalities Noted: No Callus: No Atrophie Blanche: No Crepitus: No Cyanosis: No Excoriation: Yes Ecchymosis: No Induration: No Erythema: No Rash: No Hemosiderin Staining: No Scarring: No Mottled: No Pallor: No Moisture Rubor: No No Abnormalities Noted: No Dry / Scaly: No Temperature / Pain Maceration: Yes Temperature: No Abnormality Wound Preparation Ulcer Cleansing: Rinsed/Irrigated with Saline Topical Anesthetic Applied: Other: lidocaine 4%, Treatment Notes Wound #6 (Left Toe Second) 1. Cleansed with: Clean wound with Normal Saline 4. Dressing Applied: Aquacel Ag 5. Secondary Dressing Applied Gauze and Kerlix/Conform 7. Secured with Tape Notes netting Electronic Signature(s) Signed: 10/15/2016 5:19:36 PM By: Montey Hora Entered By: Montey Hora on 10/15/2016 12:51:39 Auguste, [name] ([id number]) -------------------------------------------------------------------------------- Wound Assessment Details Patient Name: PHARAOH, PIO 10/15/2016 12:30 Date of Service: PM Medical Record [id number] Number: Patient Account Number: [id number] Date of Birth/Sex: 06-30-1950 (66 y.o. Male) Treating RN: Elyn Peers, Other Clinician: Primary Care Mareta Chesnut: TRACY Treating STONE III, HOYT MCLEAN-SCOCOZZA, Richelle Glick/Extender: Referring Duyen Beckom: Harless Nakayama in Treatment: 3 Wound Status Wound Number: 7 Primary Diabetic Wound/Ulcer of the Lower Etiology: Extremity Wound Location: Left Toe Third Secondary Pressure Ulcer Wounding Event: Gradually Appeared Etiology: Date Acquired: 10/14/2016 Wound Open Weeks Of Treatment: 0 Status: Clustered Wound: No Comorbid Anemia, Lymphedema, Congestive History: Heart Failure, Hypertension, Peripheral Venous Disease, Type II Diabetes, Neuropathy Photos Photo Uploaded By: Montey Hora on  10/15/2016 13:52:28 Wound Measurements Length: (cm) 1.3 Width: (cm) 1.5 Depth: (cm) 0.1 Area: (cm) 1.532 Volume: (cm) 0.153 % Reduction in Area: % Reduction in Volume: Epithelialization: None Tunneling: No Undermining: No Wound Description Classification: Grade 1 Foul Odor After Wound Margin: Flat and Intact Slough/Fibrino Exudate Amount: Large Exudate Type: Serous Exudate Color: amber Brim, Armando E. ([id number]) Cleansing: No Yes Wound Bed Granulation Amount: None Present (0%) Exposed Structure Necrotic Amount: Large (67-100%) Fascia Exposed: No Necrotic Quality: Adherent Slough Fat Layer (Subcutaneous Tissue) Exposed: Yes Tendon Exposed: No Muscle Exposed: No Joint Exposed: No Bone Exposed: No Periwound Skin Texture Texture Color No Abnormalities Noted: No No Abnormalities Noted: No Callus: No Atrophie Blanche: No Crepitus: No Cyanosis: No Excoriation: Yes Ecchymosis: No Induration: No Erythema: No Rash: No Hemosiderin Staining: No Scarring: No Mottled: No Pallor: No Moisture Rubor: No No Abnormalities Noted: No Dry / Scaly: No Maceration: No Wound Preparation Ulcer Cleansing: Rinsed/Irrigated with Saline Topical Anesthetic Applied: Other: lidocaine 4%, Treatment Notes Wound #7 (Left Toe Third) 1. Cleansed with: Clean wound with Normal Saline 4. Dressing Applied: Aquacel Ag 5. Secondary Dressing Applied Gauze and Kerlix/Conform 7. Secured with Tape Notes netting Electronic Signature(s) Signed: 10/15/2016 5:19:36 PM By: Montey Hora Entered By:  Montey Hora on 10/15/2016 12:53:12 Perri, CORRAN LALONE ([id number]) -------------------------------------------------------------------------------- Vitals Details Patient Name: GARRON, ELINE 10/15/2016 12:30 Date of Service: PM Medical Record [id number] Number: Patient Account Number: [id number] Date of Birth/Sex: May 27, 1950 (66 y.o. Male) Treating RN: Elyn Peers, Other  Clinician: Primary Care Clement Deneault: Olivia Mackie Treating STONE III, HOYT MCLEAN-SCOCOZZA, Dontrell Stuck/Extender: Referring Izabel Chim: Harless Nakayama in Treatment: 3 Vital Signs Time Taken: 12:44 Temperature (F): 98.0 Height (in): 69 Pulse (bpm): 63 Weight (lbs): 168 Respiratory Rate (breaths/min): 16 Body Mass Index (BMI): 24.8 Blood Pressure (mmHg): 141/73 Reference Range: 80 - 120 mg / dl Electronic Signature(s) Signed: 10/15/2016 5:19:36 PM By: Montey Hora Entered By: Montey Hora on 10/15/2016 12:46:39

## 2016-10-17 ENCOUNTER — Telehealth: Payer: Self-pay

## 2016-10-17 NOTE — Telephone Encounter (Signed)
Reminded Mr. [name] that Mr. [name] has an appointment at [facility] on 7/12 @ 8am

## 2016-10-22 ENCOUNTER — Encounter: Payer: Medicare HMO | Admitting: Physician Assistant

## 2016-10-22 DIAGNOSIS — E11621 Type 2 diabetes mellitus with foot ulcer: Secondary | ICD-10-CM | POA: Diagnosis not present

## 2016-10-22 NOTE — Progress Notes (Signed)
TRENTIN, KNAPPENBERGER ([id number]) Visit Report for 10/22/2016 Chief Complaint Document Details Patient Name: [name], [name] 10/22/2016 12:30 Date of Service: PM Medical Record [id number] Number: Patient Account Number: [id number] Date of Birth/Sex: 01-01-1951 (66 y.o. Male) Treating RN: Elyn Peers, Other Clinician: Primary Care Provider: Olivia Mackie Treating STONE III, Charlynn Grimes, Provider/Extender: Referring Provider: Harless Nakayama in Treatment: 4 Information Obtained from: Patient Chief Complaint Patients presents for treatment of an open diabetic ulcer to both feet Electronic Signature(s) Signed: 10/22/2016 3:36:45 PM By: Worthy Keeler PA-C Entered By: Worthy Keeler on 10/22/2016 13:08:02 Gropp, [name] ([id number]) -------------------------------------------------------------------------------- HPI Details Patient Name: [name] 10/22/2016 12:30 Date of Service: PM Medical Record [id number] Number: Patient Account Number: [id number] Date of Birth/Sex: March 26, 1951 (66 y.o. Male) Treating RN: Elyn Peers, Other Clinician: Primary Care Provider: Olivia Mackie Treating STONE III, Charlynn Grimes, Provider/Extender: Referring Provider: Harless Nakayama in Treatment: 4 History of Present Illness Location: bilateral feet ulceration on the toes Quality: Patient reports experiencing a dull pain to affected area(s). Severity: Patient states wound are getting better Duration: Patient has had the wound for > 3 months prior to seeking treatment at the wound center Timing: Pain in wound is constant (hurts all the time) Context: The wound would happen gradually Modifying Factors: Other treatment(s) tried include:treatment for lymphedema and is seen by the podiatrist Dr. Caryl Comes Associated Signs and Symptoms: Patient reports having increase swelling. HPI Description: 66 year old patient here to see as for bilateral feet ulceration to on his left  first and second toe and 2 on his right first and second toe, which she's had for about 4 months. 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 Donella Stade. The patient is also undergoing workup by medical oncology for a intra-abdominal lymphadenopathy. 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 He is also status post appendectomy, inguinal lymph node biopsy, rectal biopsy and rectal examination under anesthesia. he currently smokes cigarettes about half packet a day. In March of this year he was seen by Dr. Hortencia Pilar, for evaluation of bilateral varicose veins and besides wearing compression stockings he had recommended laser ablation of the right and left great saphenous veins to eleviate the symptoms and complications of severe superficial venous reflux disease. He also recommended lymphedema pumps for better control of his lymphedema. The patient recently has had on 08/23/2016, right greater saphenous vein ablation with the laser energy Earlier lower extremity venous reflux examination done on 05/08/2016 showed no DVT or SVT both lower legs but incompetence of bilateral great saphenous veins was present. A lower arterial study was also done and there was no significant right lower and left lower extremity problems based on a normal toe brachial index bilaterally and the ABI was 1.221 the left and 1.23 on the right. His post ablation venous duplex examination showed successful ablation of the right GS vein with thrombus formation 2 below the right saphenofemoral junction. The deep system was patent without evidence of thrombosis and this was done on 08/30/2016. the patient also has a squamous cell cancer of the skin of the buttock and is recently  undergone radiation therapy for this prior to excisional surgery. Addendum: regarding his x-rays done today and x-ray of the left foot -- IMPRESSION: No objective Radke, Keylen E. ([id number])  evidence of osteomyelitis. There are soft tissue changes which may reflect cellulitis. X-ray of the right foot -- IMPRESSION:Findings compatible with cellulitis of the toes. No objective evidence of osteomyelitis is observed. 10/01/16 on evaluation today patient's wounds appeared to be doing some better. I did review the x-rays as well which showed no evidence of osteomyelitis although there was evidence on x-ray of cellulitis. He fortunately is not having any discomfort although he continues to have some swelling. He does not remember being on any antibiotics recently. 10/15/16 on evaluation today patient's wounds overall appear to be doing better although he does have a new location noted on the left foot. Fortunately he is not having significant pain. It almost has the appearance that something is rubbing on the end of his toes but he wears the open toe shoes and according to what he is telling me never wears anything that would rub on his foot. There is no evidence of infection and specifically no evidence of a fungal infection 10/22/16 On evaluation today patient's wounds appeared to be doing better compared to last week in regard to his bilateral lower extremities. Fortunately I happy with how things are progressing although he still has ulcers I feel like that he is improving and appropriate manner. Electronic Signature(s) Signed: 10/22/2016 3:36:45 PM By: Worthy Keeler PA-C Entered By: Worthy Keeler on 10/22/2016 13:09:32 Holzworth, [name] ([id number]) -------------------------------------------------------------------------------- Physical Exam Details Patient Name: [name] 10/22/2016 12:30 Date of Service: PM Medical Record [id number] Number: Patient Account Number: [id number] Date of  Birth/Sex: 08/09/1950 (66 y.o. Male) Treating RN: Elyn Peers, Other Clinician: Primary Care Provider: TRACY Treating STONE III, Charlynn Grimes, Provider/Extender: Referring Provider: Harless Nakayama in Treatment: 4 Constitutional Well-nourished and well-hydrated in no acute distress. Respiratory normal breathing without difficulty. clear to auscultation bilaterally. Cardiovascular regular rate and rhythm with normal S1, S2. Psychiatric this patient is able to make decisions and demonstrates good insight into disease process. Alert and Oriented x 3. pleasant and cooperative. Notes Patient's wounds appeared to be mostly dry and healing at this point. There's no evidence of infection and patient does not appear to have any severe or significant pain at this point. Electronic Signature(s) Signed: 10/22/2016 3:36:45 PM By: Worthy Keeler PA-C Entered By: Worthy Keeler on 10/22/2016 13:14:44 Halfhill, [name] ([id number]) -------------------------------------------------------------------------------- Physician Orders Details Patient Name: KAYD, LAUNER 10/22/2016 12:30 Date of Service: PM Medical Record [id number] Number: Patient Account Number: [id number] Date of Birth/Sex: 08-13-1950 (66 y.o. Male) Treating RN: Elyn Peers, Other Clinician: Primary Care Provider: Olivia Mackie Treating STONE III, Charlynn Grimes, Provider/Extender: Referring Provider: Harless Nakayama in Treatment: 4 Verbal / Phone Orders: No Diagnosis Coding ICD-10 Coding Code Description E11.621 Type 2 diabetes mellitus with foot ulcer I87.313 Chronic venous hypertension (idiopathic) with ulcer of bilateral lower extremity I89.0 Lymphedema, not elsewhere classified L97.522 Non-pressure chronic ulcer of other part of left foot with fat layer exposed L97.512 Non-pressure chronic ulcer of other part of right foot with fat layer exposed F17.218 Nicotine dependence,  cigarettes, 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 abuse with unspecified alcohol-induced disorder Wound Cleansing Wound #1 Right Toe Great o Cleanse wound with mild soap and water o May Shower, gently pat wound dry prior to applying new dressing. Wound #2 Right Toe Second o Cleanse wound with mild soap and water o May Shower, gently pat wound dry prior to applying new dressing. Wound #3 Right Toe Third o Cleanse wound with mild soap and water o May  Shower, gently pat wound dry prior to applying new dressing. Wound #4 Right,Dorsal Foot o Cleanse wound with mild soap and water o May Shower, gently pat wound dry prior to applying new dressing. Wound #5 Left Toe Great o Cleanse wound with mild soap and water o May Shower, gently pat wound dry prior to applying new dressing. Wound #6 Left Toe Second o Cleanse wound with mild soap and water Mcglothlin, Teejay E. ([id number]) o May Shower, gently pat wound dry prior to applying new dressing. Wound #7 Left Toe Third o Cleanse wound with mild soap and water o May Shower, gently pat wound dry prior to applying new dressing. Anesthetic Wound #1 Right Toe Great o Topical Lidocaine 4% cream applied to wound bed prior to debridement Wound #2 Right Toe Second o Topical Lidocaine 4% cream applied to wound bed prior to debridement Wound #3 Right Toe Third o Topical Lidocaine 4% cream applied to wound bed prior to debridement Wound #4 Right,Dorsal Foot o Topical Lidocaine 4% cream applied to wound bed prior to debridement Wound #5 Left Toe Great o Topical Lidocaine 4% cream applied to wound bed prior to debridement Wound #6 Left Toe Second o Topical Lidocaine 4% cream applied to wound bed prior to debridement Wound #7 Left Toe Third o Topical Lidocaine 4% cream applied to wound bed prior to debridement Primary Wound Dressing Wound #1 Right Toe Great o Aquacel Ag Wound #2 Right Toe  Second o Aquacel Ag Wound #3 Right Toe Third o Aquacel Ag Wound #4 Right,Dorsal Foot o Aquacel Ag Wound #5 Left Toe Great o Aquacel Ag Wound #6 Left Toe Second o Aquacel Ag Wound #7 Left Toe Third o Aquacel Ag Noyce, Kaylan E. ([id number]) Secondary Dressing Wound #1 Right Toe Great o Gauze and Kerlix/Conform Wound #2 Right Toe Second o Gauze and Kerlix/Conform Wound #3 Right Toe Third o Gauze and Kerlix/Conform Wound #4 Right,Dorsal Foot o Gauze and Kerlix/Conform Wound #5 Left Toe Great o Gauze and Kerlix/Conform Wound #6 Left Toe Second o Gauze and Kerlix/Conform Wound #7 Left Toe Third o Gauze and Kerlix/Conform Dressing Change Frequency Wound #1 Right Toe Great o Change dressing every day. Wound #2 Right Toe Second o Change dressing every day. Wound #3 Right Toe Third o Change dressing every day. Wound #4 Right,Dorsal Foot o Change dressing every day. Wound #5 Left Toe Great o Change dressing every day. Wound #6 Left Toe Second o Change dressing every day. Wound #7 Left Toe Third o Change dressing every day. Follow-up Appointments Wound #1 Right Toe Great o Return Appointment in 1 week. MALIKI, GIGNAC ([id number]) Wound #2 Right Toe Second o Return Appointment in 1 week. Wound #3 Right Toe Third o Return Appointment in 1 week. Wound #4 Right,Dorsal Foot o Return Appointment in 1 week. Wound #5 Left Toe Great o Return Appointment in 1 week. Wound #6 Left Toe Second o Return Appointment in 1 week. Wound #7 Left Toe Third o Return Appointment in 1 week. Edema Control Wound #1 Right Toe Great o Patient to wear own compression stockings o Elevate legs to the level of the heart and pump ankles as often as possible Wound #2 Right Toe Second o Patient to wear own compression stockings o Elevate legs to the level of the heart and pump ankles as often as possible Wound #3 Right Toe Third o  Patient to wear own compression stockings o Elevate legs to the level of the heart and pump ankles as often as possible  Wound #4 Right,Dorsal Foot o Patient to wear own compression stockings o Elevate legs to the level of the heart and pump ankles as often as possible Wound #5 Left Toe Great o Patient to wear own compression stockings o Elevate legs to the level of the heart and pump ankles as often as possible Wound #6 Left Toe Second o Patient to wear own compression stockings o Elevate legs to the level of the heart and pump ankles as often as possible Wound #7 Left Toe Third o Patient to wear own compression stockings o Elevate legs to the level of the heart and pump ankles as often as possible Additional Orders / Instructions Gillette, Ori E. ([id number]) Wound #1 Right Toe Great o Stop Smoking - Please continue to stay away from alcohol o Other: - Please add vitamin A, vitamin C, and Zinc supplements to your diet Wound #2 Right Toe Second o Stop Smoking - Please continue to stay away from alcohol o Other: - Please add vitamin A, vitamin C, and Zinc supplements to your diet Wound #3 Right Toe Third o Stop Smoking - Please continue to stay away from alcohol o Other: - Please add vitamin A, vitamin C, and Zinc supplements to your diet Wound #4 Right,Dorsal Foot o Stop Smoking - Please continue to stay away from alcohol o Other: - Please add vitamin A, vitamin C, and Zinc supplements to your diet Wound #5 Left Toe Great o Stop Smoking - Please continue to stay away from alcohol o Other: - Please add vitamin A, vitamin C, and Zinc supplements to your diet Wound #6 Left Toe Second o Stop Smoking - Please continue to stay away from alcohol o Other: - Please add vitamin A, vitamin C, and Zinc supplements to your diet Wound #7 Left Toe Third o Stop Smoking - Please continue to stay away from alcohol o Other: - Please add vitamin A, vitamin  C, and Zinc supplements to your diet Notes I'm going to recommend that we continue with the Current wound care measures for the next week. If anything worsens in the interim patient will contact our office for additional recommendations. Electronic Signature(s) Signed: 10/22/2016 3:36:45 PM By: Worthy Keeler PA-C Entered By: Worthy Keeler on 10/22/2016 13:17:30 Shreiner, [name] ([id number]) -------------------------------------------------------------------------------- Problem List Details Patient Name: ANUAR, WALGREN 10/22/2016 12:30 Date of Service: PM Medical Record [id number] Number: Patient Account Number: [id number] Date of Birth/Sex: 01/26/1951 (66 y.o. Male) Treating RN: Elyn Peers, Other Clinician: Primary Care Provider: Olivia Mackie Treating STONE III, Charlynn Grimes, Provider/Extender: Referring Provider: Harless Nakayama in Treatment: 4 Active Problems ICD-10 Encounter Code Description Active Date Diagnosis E11.621 Type 2 diabetes mellitus with foot ulcer 09/21/2016 Yes I87.313 Chronic venous hypertension (idiopathic) with ulcer of 09/21/2016 Yes bilateral lower extremity I89.0 Lymphedema, not elsewhere classified 09/21/2016 Yes L97.522 Non-pressure chronic ulcer of other part of left foot with fat 09/21/2016 Yes layer exposed L97.512 Non-pressure chronic ulcer of other part of right foot with 09/21/2016 Yes fat layer exposed F17.218 Nicotine dependence, cigarettes, 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/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 F10.19 Alcohol abuse with unspecified alcohol-induced disorder 09/21/2016 Yes Inactive Problems Resolved Problems Electronic Signature(s) BURLEY, KOPKA ([id number]) Signed: 10/22/2016 3:36:45 PM By: Worthy Keeler PA-C Entered By: Worthy Keeler on 10/22/2016 13:02:56 Pollick, [name] ([id number]) -------------------------------------------------------------------------------- Progress Note Details Patient Name: DONIE, LEMELIN  10/22/2016 12:30 Date of Service: PM Medical Record [id number] Number: Patient Account Number: [id number] Date of Birth/Sex: 06-03-1950 (66 y.o. Male) Treating RN:  Dorthy, Carney Living, Other Clinician: Primary Care Provider: Olivia Mackie Treating STONE III, HOYT MCLEAN-SCOCOZZA, Provider/Extender: Referring Provider: Harless Nakayama in Treatment: 4 Subjective Chief Complaint Information obtained from Patient Patients presents for treatment of an open diabetic ulcer to both feet History of Present Illness (HPI) The following HPI elements were documented for the patient's wound: Location: bilateral feet ulceration on the toes Quality: Patient reports experiencing a dull pain to affected area(s). Severity: Patient states wound are getting better Duration: Patient has had the wound for > 3 months prior to seeking treatment at the wound center Timing: Pain in wound is constant (hurts all the time) Context: The wound would happen gradually Modifying Factors: Other treatment(s) tried include:treatment for lymphedema and is seen by the podiatrist Dr. Caryl Comes Associated Signs and Symptoms: Patient reports having increase swelling. 66 year old patient here to see as for bilateral feet ulceration to on his left first and second toe and 2 on his right first and second toe, which she's had for about 4 months. 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 Donella Stade. The patient is also undergoing workup by medical oncology for a intra- abdominal lymphadenopathy. 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 He is also status post appendectomy, inguinal lymph node biopsy, rectal biopsy and rectal examination under anesthesia. he currently smokes cigarettes  about half packet a day. In March of this year he was seen by Dr. Hortencia Pilar, for evaluation of bilateral varicose veins and besides wearing compression stockings he had recommended laser ablation of the right and left great saphenous veins to eleviate the symptoms and complications of severe superficial venous reflux disease. He also recommended lymphedema pumps for better control of his lymphedema. The patient recently has had on 08/23/2016, right greater saphenous vein ablation with the laser energy Earlier lower extremity venous reflux examination done on 05/08/2016 showed no DVT or SVT both lower legs but incompetence of bilateral great saphenous veins was present. A lower arterial study was also done and there was no significant right lower and left lower extremity problems based on a normal toe brachial index bilaterally and the ABI was 1.221 the left and 1.23 on the Manheim, Nahum E. ([id number]) right. His post ablation venous duplex examination showed successful ablation of the right GS vein with thrombus formation 2 below the right saphenofemoral junction. The deep system was patent without evidence of thrombosis and this was done on 08/30/2016. 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 Addendum: regarding his x-rays done today and x-ray of the left foot -- IMPRESSION: No objective evidence of osteomyelitis. There are soft tissue changes which may reflect cellulitis. X-ray of the right foot -- IMPRESSION:Findings compatible with cellulitis of the toes. No objective evidence of osteomyelitis is observed. 10/01/16 on evaluation today patient's wounds appeared to be doing some better. I did review the x-rays as well which showed no evidence of osteomyelitis although there was evidence on x-ray of cellulitis. He fortunately is not having any discomfort although he continues to  have some swelling. He does not remember being on any antibiotics recently. 10/15/16 on evaluation today patient's wounds overall appear to be doing better although he does have a  new location noted on the left foot. Fortunately he is not having significant pain. It almost has the appearance that something is rubbing on the end of his toes but he wears the open toe shoes and according to what he is telling me never wears anything that would rub on his foot. There is no evidence of infection and specifically no evidence of a fungal infection 10/22/16 On evaluation today patient's wounds appeared to be doing better compared to last week in regard to his bilateral lower extremities. Fortunately I happy with how things are progressing although he still has ulcers I feel like that he is improving and appropriate manner. Objective Constitutional Well-nourished and well-hydrated in no acute distress. Vitals Time Taken: 12:36 PM, Height: 69 in, Weight: 168 lbs, BMI: 24.8, Temperature: 98.1 F, Pulse: 66 bpm, Respiratory Rate: 16 breaths/min, Blood Pressure: 154/69 mmHg. Respiratory normal breathing without difficulty. clear to auscultation bilaterally. Cardiovascular regular rate and rhythm with normal S1, S2. Molner, Muaaz E. ([id number]) Psychiatric this patient is able to make decisions and demonstrates good insight into disease process. Alert and Oriented x 3. pleasant and cooperative. General Notes: Patient's wounds appeared to be mostly dry and healing at this point. There's no evidence of infection and patient does not appear to have any severe or significant pain at this point. Integumentary (Hair, Skin) Wound #1 status is Open. Original cause of wound was Gradually Appeared. The wound is located on the Right Toe Great. The wound measures 4cm length x 5.5cm width x 0.1cm depth; 17.279cm^2 area and 1.728cm^3 volume. There is Fat Layer (Subcutaneous Tissue) Exposed exposed. There is no  tunneling or undermining noted. There is a large amount of serous drainage noted. The wound margin is flat and intact. There is large (67-100%) pink granulation within the wound bed. There is a small (1-33%) amount of necrotic tissue within the wound bed including Adherent Slough. The periwound skin appearance exhibited: Excoriation, Maceration. The periwound skin appearance did not exhibit: Callus, Crepitus, Induration, Rash, Scarring, Dry/Scaly, Atrophie Blanche, Cyanosis, Ecchymosis, Hemosiderin Staining, Mottled, Pallor, Rubor, Erythema. Periwound temperature was noted as No Abnormality. Wound #2 status is Open. Original cause of wound was Gradually Appeared. The wound is located on the Right Toe Second. The wound measures 0.5cm length x 0.6cm width x 0.1cm depth; 0.236cm^2 area and 0.024cm^3 volume. There is Fat Layer (Subcutaneous Tissue) Exposed exposed. There is no tunneling or undermining noted. There is a large amount of serous drainage noted. The wound margin is flat and intact. There is medium (34-66%) pink granulation within the wound bed. There is a medium (34-66%) amount of necrotic tissue within the wound bed including Adherent Slough. The periwound skin appearance exhibited: Excoriation. The periwound skin appearance did not exhibit: Callus, Crepitus, Induration, Rash, Scarring, Dry/Scaly, Maceration, Atrophie Blanche, Cyanosis, Ecchymosis, Hemosiderin Staining, Mottled, Pallor, Rubor, Erythema. Periwound temperature was noted as No Abnormality. Wound #3 status is Open. Original cause of wound was Gradually Appeared. The wound is located on the Right Toe Third. The wound measures 0.1cm length x 0.1cm width x 0.1cm depth; 0.008cm^2 area and 0.001cm^3 volume. There is Fat Layer (Subcutaneous Tissue) Exposed exposed. There is no tunneling or undermining noted. There is a large amount of serous drainage noted. The wound margin is flat and intact. There is large (67-100%) pink  granulation within the wound bed. There is a small (1-33%) amount of necrotic tissue within the wound bed including Adherent Slough. The periwound skin appearance exhibited: Excoriation. The periwound skin appearance did not  exhibit: Callus, Crepitus, Induration, Rash, Scarring, Dry/Scaly, Maceration, Atrophie Blanche, Cyanosis, Ecchymosis, Hemosiderin Staining, Mottled, Pallor, Rubor, Erythema. Periwound temperature was noted as No Abnormality. Wound #4 status is Open. Original cause of wound was Gradually Appeared. The wound is located on the Right,Dorsal Foot. The wound measures 0.5cm length x 2.5cm width x 0.1cm depth; 0.982cm^2 area and 0.098cm^3 volume. Wound #5 status is Open. Original cause of wound was Gradually Appeared. The wound is located on the Left Toe Great. The wound measures 1cm length x 4cm width x 0.1cm depth; 3.142cm^2 area and 0.314cm^3 volume. There is Fat Layer (Subcutaneous Tissue) Exposed exposed. There is no tunneling or undermining noted. There is a large amount of serous drainage noted. The wound margin is flat and intact. There is medium (34-66%) pink granulation within the wound bed. There is a medium (34-66%) amount of necrotic tissue within the wound bed including Adherent Slough. The periwound skin appearance exhibited: Excoriation. The periwound skin appearance did not exhibit: Callus, Crepitus, Induration, Rash, Scarring, Dry/Scaly, Maceration, Atrophie Blanche, Cyanosis, Ecchymosis, Hemosiderin Staining, Mottled, Pallor, Kalil, Nabil E. ([id number]) Rubor, Erythema. Periwound temperature was noted as No Abnormality. Wound #6 status is Open. Original cause of wound was Gradually Appeared. The wound is located on the Left Toe Second. The wound measures 0.8cm length x 0.8cm width x 0.1cm depth; 0.503cm^2 area and 0.05cm^3 volume. There is Fat Layer (Subcutaneous Tissue) Exposed exposed. There is no tunneling or undermining noted. There is a large amount of serous  drainage noted. The wound margin is flat and intact. There is medium (34-66%) pink granulation within the wound bed. There is a medium (34-66%) amount of necrotic tissue within the wound bed including Adherent Slough. The periwound skin appearance exhibited: Excoriation. The periwound skin appearance did not exhibit: Callus, Crepitus, Induration, Rash, Scarring, Dry/Scaly, Maceration, Atrophie Blanche, Cyanosis, Ecchymosis, Hemosiderin Staining, Mottled, Pallor, Rubor, Erythema. Periwound temperature was noted as No Abnormality. Wound #7 status is Open. Original cause of wound was Gradually Appeared. The wound is located on the Left Toe Third. The wound measures 0.7cm length x 0.7cm width x 0.1cm depth; 0.385cm^2 area and 0.038cm^3 volume. There is Fat Layer (Subcutaneous Tissue) Exposed exposed. There is no tunneling or undermining noted. There is a large amount of serous drainage noted. The wound margin is flat and intact. There is no granulation within the wound bed. There is a large (67-100%) amount of necrotic tissue within the wound bed including Adherent Slough. The periwound skin appearance exhibited: Excoriation. The periwound skin appearance did not exhibit: Callus, Crepitus, Induration, Rash, Scarring, Dry/Scaly, Maceration, Atrophie Blanche, Cyanosis, Ecchymosis, Hemosiderin Staining, Mottled, Pallor, Rubor, Erythema. 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 Wound #2 Right Toe Second: Mapp, Odes E. ([id number]) Cleanse wound with mild soap and water May Shower, gently pat wound dry prior to applying new dressing. Wound #3 Right Toe Third: Cleanse wound with mild soap and water May Shower, gently pat wound dry  prior to applying new dressing. Wound #4 Right,Dorsal Foot: Cleanse wound with mild soap and water May Shower, gently pat wound dry prior to applying new dressing. Wound #5 Left Toe Great: Cleanse wound with mild soap and water May Shower, gently pat wound dry prior to applying new dressing. Wound #6 Left Toe Second: Cleanse wound with mild soap and water May Shower, gently pat wound dry prior to applying new dressing. Wound #7 Left Toe Third: Cleanse wound with mild soap and water May Shower, gently pat wound dry prior to applying new dressing. Anesthetic: Wound #1 Right Toe Great: Topical Lidocaine 4% cream applied to wound bed prior to debridement Wound #2 Right Toe Second: Topical Lidocaine 4% cream applied to wound bed prior to debridement Wound #3 Right Toe Third: Topical Lidocaine 4% cream applied to wound bed prior to debridement Wound #4 Right,Dorsal Foot: Topical Lidocaine 4% cream applied to wound bed prior to debridement Wound #5 Left Toe Great: Topical Lidocaine 4% cream applied to wound bed prior to debridement Wound #6 Left Toe Second: Topical Lidocaine 4% cream applied to wound bed prior to debridement Wound #7 Left Toe Third: Topical Lidocaine 4% cream applied to wound bed prior to debridement Primary Wound Dressing: Wound #1 Right Toe Great: Aquacel Ag Wound #2 Right Toe Second: Aquacel Ag Wound #3 Right Toe Third: Aquacel Ag Wound #4 Right,Dorsal Foot: Aquacel Ag Wound #5 Left Toe Great: Aquacel Ag Wound #6 Left Toe Second: Aquacel Ag Wound #7 Left Toe Third: Aquacel  Ag Secondary Dressing: Wound #1 Right Toe Great: Gauze and Kerlix/Conform Wound #2 Right Toe Second: Lagunes, Ezekeil E. ([id number]) Gauze and Kerlix/Conform Wound #3 Right Toe Third: Gauze and Kerlix/Conform Wound #4 Right,Dorsal Foot: Gauze and Kerlix/Conform Wound #5 Left Toe Great: Gauze and Kerlix/Conform Wound #6 Left Toe Second: Gauze and Kerlix/Conform Wound #7 Left Toe Third: Gauze and Kerlix/Conform Dressing Change Frequency: Wound #1 Right Toe Great: Change dressing every day. Wound #2 Right Toe Second: Change dressing every day. Wound #3 Right Toe Third: Change dressing every day. Wound #4 Right,Dorsal Foot: Change dressing every day. Wound #5 Left Toe Great: Change dressing every day. Wound #6 Left Toe Second: Change dressing every day. Wound #7 Left Toe Third: Change dressing every day. Follow-up Appointments: Wound #1 Right Toe Great: Return Appointment in 1 week. Wound #2 Right Toe Second: Return Appointment in 1 week. Wound #3 Right Toe Third: Return Appointment in 1 week. Wound #4 Right,Dorsal Foot: Return Appointment in 1 week. Wound #5 Left Toe Great: Return Appointment in 1 week. Wound #6 Left Toe Second: Return Appointment in 1 week. Wound #7 Left Toe Third: Return Appointment in 1 week. Edema Control: Wound #1 Right Toe Great: Patient to wear own compression stockings Elevate legs to the level of the heart and pump ankles as often as possible Wound #2 Right Toe Second: Patient to wear own compression stockings Elevate legs to the level of the heart and pump ankles as often as possible Wound #3 Right Toe Third: Patient to wear own compression stockings Elevate legs to the level of the heart and pump ankles as often as possible Noffsinger, Abubakr E. ([id number]) Wound #4 Right,Dorsal Foot: Patient to wear own compression stockings Elevate legs to the level of the heart and pump ankles as often as possible Wound #5 Left Toe Great: Patient to wear  own compression stockings Elevate legs to the level of the heart and pump ankles as often as possible Wound #6 Left Toe Second: Patient to  wear own compression stockings Elevate legs to the level of the heart and pump ankles as often as possible Wound #7 Left Toe Third: Patient to wear own compression stockings Elevate legs to the level of the heart and pump ankles as often as possible Additional Orders / Instructions: Wound #1 Right Toe Great: Stop Smoking - Please continue to stay away from alcohol Other: - Please add vitamin A, vitamin C, and Zinc supplements to your diet Wound #2 Right Toe Second: Stop Smoking - Please continue to stay away from alcohol Other: - Please add vitamin A, vitamin C, and Zinc supplements to your diet Wound #3 Right Toe Third: Stop Smoking - Please continue to stay away from alcohol Other: - Please add vitamin A, vitamin C, and Zinc supplements to your diet Wound #4 Right,Dorsal Foot: Stop Smoking - Please continue to stay away from alcohol Other: - Please add vitamin A, vitamin C, and Zinc supplements to your diet Wound #5 Left Toe Great: Stop Smoking - Please continue to stay away from alcohol Other: - Please add vitamin A, vitamin C, and Zinc supplements to your diet Wound #6 Left Toe Second: Stop Smoking - Please continue to stay away from alcohol Other: - Please add vitamin A, vitamin C, and Zinc supplements to your diet Wound #7 Left Toe Third: Stop Smoking - Please continue to stay away from alcohol Other: - Please add vitamin A, vitamin C, and Zinc supplements to your diet General Notes: I'm going to recommend that we continue with the Current wound care measures for the next week. If anything worsens in the interim patient will contact our office for additional recommendations. Electronic Signature(s) Signed: 10/22/2016 3:36:45 PM By: Worthy Keeler PA-C Entered By: Worthy Keeler on 10/22/2016 13:20:16 Akram, [name]  ([id number]) -------------------------------------------------------------------------------- SuperBill Details Patient Name: Hauger, Felton E. Date of Service: 10/22/2016 Medical Record Patient Account Number: [id number] Number: Treating RN: Montey Hora Date of Birth/Sex: Jul 04, 1950 (66 y.o. 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 F17.218 Nicotine dependence, cigarettes, 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 abuse with unspecified alcohol-induced disorder Physician Procedures CPT4: Description Modifier Quantity Code [id number] - WC PHYS LEVEL 3 - EST PT 1 ICD-10 Description Diagnosis E11.621 Type 2 diabetes mellitus with foot ulcer I87.313 Chronic venous hypertension (idiopathic) with ulcer of bilateral lower extremity  I89.0 Lymphedema, not elsewhere classified L97.522 Non-pressure chronic ulcer of other part of left foot with fat layer exposed Electronic Signature(s) Signed: 10/22/2016 3:36:45 PM By: Worthy Keeler PA-C Entered By: Worthy Keeler on 10/22/2016 13:24:18

## 2016-10-23 NOTE — Progress Notes (Signed)
[name], [name] ([id number]) Visit Report for 10/22/2016 Arrival Information Details Patient Name: [name], [name] 10/22/2016 12:30 Date of Service: PM Medical Record [id number] Number: Patient Account Number: [id number] Date of Birth/Sex: [birth year] (67 y.o. Male) Treating RN: Elyn Peers, Other Clinician: Primary Care Jennamarie Goings: Olivia Mackie Treating STONE III, HOYT MCLEAN-SCOCOZZA, Cristol Engdahl/Extender: Referring My Madariaga: Harless Nakayama in Treatment: 4 Visit Information History Since Last Visit Added or deleted any medications: No Patient Arrived: Ambulatory Any new allergies or adverse reactions: No Arrival Time: 12:34 Had a fall or experienced change in No Accompanied By: brother activities of daily living that may affect Transfer Assistance: None risk of falls: Patient Identification Verified: Yes Signs or symptoms of abuse/neglect since last No Secondary Verification Process Yes visito Completed: Hospitalized since last visit: No Patient Has Alerts: Yes Has Dressing in Place as Prescribed: Yes Patient Alerts: DMII Pain Present Now: No Electronic Signature(s) Signed: 10/22/2016 5:00:03 PM By: Montey Hora Entered By: Montey Hora on 10/22/2016 12:35:11 [name], [name] ([id number]) -------------------------------------------------------------------------------- Encounter Discharge Information Details Patient Name: [name], [name] 10/22/2016 12:30 Date of Service: PM Medical Record [id number] Number: Patient Account Number: [id number] Date of Birth/Sex: 08-20-1950 (66 y.o. Male) Treating RN: Elyn Peers, Other Clinician: Primary Care [place]: TRACY Treating STONE III, Charlynn Grimes, Makenli Derstine/Extender: Referring Deveon Kisiel: Harless Nakayama in Treatment: 4 Encounter Discharge Information Items Discharge Pain Level: 0 Discharge Condition: Stable Ambulatory Status: Ambulatory Discharge Destination: Home Transportation: Private  Auto Accompanied By: brother Schedule Follow-up Appointment: Yes Medication Reconciliation completed and provided to Patient/Care No Sid Greener: Provided on Clinical Summary of Care: 10/22/2016 Form Type Recipient Paper Patient ED Electronic Signature(s) Signed: 10/22/2016 1:21:20 PM By: Ruthine Dose Previous Signature: 10/22/2016 12:50:00 PM Version By: Montey Hora Entered By: Ruthine Dose on 10/22/2016 13:21:20 [name], [name] ([id number]) -------------------------------------------------------------------------------- Lower Extremity Assessment Details Patient Name: [name], [name] 10/22/2016 12:30 Date of Service: PM Medical Record [id number] Number: Patient Account Number: [id number] Date of Birth/Sex: 05-Apr-1951 (66 y.o. Male) Treating RN: Elyn Peers, Other Clinician: Primary Care Craigory Toste: Olivia Mackie Treating STONE III, Charlynn Grimes, Tanga Gloor/Extender: Referring Andelyn Spade: Harless Nakayama in Treatment: 4 Vascular Assessment Pulses: Dorsalis Pedis Palpable: [Left:Yes] [Right:Yes] Posterior Tibial Extremity colors, hair growth, and conditions: Extremity Color: [Left:Hyperpigmented] [Right:Hyperpigmented] Hair Growth on Extremity: [Left:No] [Right:No] Temperature of Extremity: [Left:Warm] [Right:Warm] Capillary Refill: [Left:< 3 seconds] [Right:< 3 seconds] Electronic Signature(s) Signed: 10/22/2016 12:49:02 PM By: Montey Hora Entered By: Montey Hora on 10/22/2016 12:49:02 [name], [name] ([id number]) -------------------------------------------------------------------------------- Multi Wound Chart Details Patient Name: [name] 10/22/2016 12:30 Date of Service: PM Medical Record [id number] Number: Patient Account Number: [id number] Date of Birth/Sex: 1950-10-10 (66 y.o. Male) Treating RN: Elyn Peers, Other Clinician: Primary Care Tracy Kinner: TRACY Treating STONE III, HOYT MCLEAN-SCOCOZZA,  Tamberlyn Midgley/Extender: Referring Frederika Hukill: Harless Nakayama in Treatment: 4 Vital Signs Height(in): 69 Pulse(bpm): 66 Weight(lbs): 168 Blood Pressure 154/69 (mmHg): Body Mass Index(BMI): 25 Temperature(F): 98.1 Respiratory Rate 16 (breaths/min): Photos: [1:No Photos] [2:No Photos] [3:No Photos] Wound Location: [1:Right Toe Great] [2:Right Toe Second] [3:Right Toe Third] Wounding Event: [1:Gradually Appeared] [2:Gradually Appeared] [3:Gradually Appeared] Primary Etiology: [1:Diabetic Wound/Ulcer of the Lower Extremity] [2:Diabetic Wound/Ulcer of the Lower Extremity] [3:Diabetic Wound/Ulcer of the Lower Extremity] Secondary Etiology: [1:N/A] [2:N/A] [3:N/A] Comorbid History: [1:Anemia, Lymphedema, Congestive Heart Failure, Hypertension, Peripheral Venous Disease, Type II Diabetes, Neuropathy] [2:Anemia, Lymphedema, Congestive Heart Failure, Hypertension, Peripheral Venous Disease, Type II Diabetes,  Neuropathy] [3:Anemia, Lymphedema, Congestive Heart Failure, Hypertension, Peripheral Venous Disease, Type II Diabetes, Neuropathy] Date Acquired: [1:06/11/2016] [2:06/11/2016] [3:06/11/2016] Weeks of Treatment: [1:4] [2:4] [  3:4] Wound Status: [1:Open] [2:Open] [3:Open] Pending Amputation on Yes [2:Yes] [3:Yes] Presentation: Measurements L x W x D 4x5.5x0.1 [2:0.5x0.6x0.1] [3:0.1x0.1x0.1] (cm) Area (cm) : [1:17.279] [2:0.236] [3:0.008] Volume (cm) : [1:1.728] [2:0.024] [3:0.001] % Reduction in Area: [1:63.30%] [2:92.30%] [3:98.40%] % Reduction in Volume: 63.30% [2:92.20%] [3:98.00%] Classification: [1:Grade 1] [2:Grade 1] [3:Grade 1] Exudate Amount: [1:Large] [2:Large] [3:Large] Exudate Type: [1:Serous] [2:Serous] [3:Serous] Exudate Color: [1:amber Yes] [2:amber Yes] [3:amber Yes] Foul Odor After Cleansing: Odor Anticipated Due to No No No Product Use: Wound Margin: Flat and Intact Flat and Intact Flat and Intact Granulation Amount: Large (67-100%) Medium (34-66%) Large  (67-100%) Granulation Quality: Pink Pink Pink Necrotic Amount: Small (1-33%) Medium (34-66%) Small (1-33%) Exposed Structures: Fat Layer (Subcutaneous Fat Layer (Subcutaneous Fat Layer (Subcutaneous Tissue) Exposed: Yes Tissue) Exposed: Yes Tissue) Exposed: Yes Fascia: No Fascia: No Fascia: No Tendon: No Tendon: No Tendon: No Muscle: No Muscle: No Muscle: No Joint: No Joint: No Joint: No Bone: No Bone: No Bone: No Epithelialization: Large (67-100%) Small (1-33%) Small (1-33%) Periwound Skin Texture: Excoriation: Yes Excoriation: Yes Excoriation: Yes Induration: No Induration: No Induration: No Callus: No Callus: No Callus: No Crepitus: No Crepitus: No Crepitus: No Rash: No Rash: No Rash: No Scarring: No Scarring: No Scarring: No Periwound Skin Maceration: Yes Maceration: No Maceration: No Moisture: Dry/Scaly: No Dry/Scaly: No Dry/Scaly: No Periwound Skin Color: Atrophie Blanche: No Atrophie Blanche: No Atrophie Blanche: No Cyanosis: No Cyanosis: No Cyanosis: No Ecchymosis: No Ecchymosis: No Ecchymosis: No Erythema: No Erythema: No Erythema: No Hemosiderin Staining: No Hemosiderin Staining: No Hemosiderin Staining: No Mottled: No Mottled: No Mottled: No Pallor: No Pallor: No Pallor: No Rubor: No Rubor: No Rubor: No Temperature: No Abnormality No Abnormality No Abnormality Tenderness on No No No Palpation: Wound Preparation: Ulcer Cleansing: Ulcer Cleansing: Ulcer Cleansing: Rinsed/Irrigated with Rinsed/Irrigated with Rinsed/Irrigated with Saline Saline Saline Topical Anesthetic Topical Anesthetic Topical Anesthetic Applied: None Applied: None Applied: None Wound Number: [id number] 6  Photos: No Photos No Photos No Photos Wound Location: Right, Dorsal Foot Left Toe Great Left Toe Second Wounding Event: Gradually Appeared Gradually Appeared Gradually Appeared Primary Etiology: 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 ([id number]) Anemia, Lymphedema, Anemia, Lymphedema, Congestive Heart Failure, Congestive Heart Failure, Hypertension, Peripheral Hypertension, Peripheral Venous Disease, Type II Venous Disease, Type II Diabetes, Neuropathy Diabetes, Neuropathy Date Acquired: 06/11/2016 06/11/2016 06/11/2016 Weeks of Treatment: 4 4 4  Wound Status: Open Open Open Pending Amputation on Yes Yes Yes Presentation: Measurements L x W x D 0.5x2.5x0.1 1x4x0.1 0.8x0.8x0.1 (cm) Area (cm) : 0.982 3.142 0.503 Volume (cm) : 0.098 0.314 0.05 % Reduction in Area: 77.90% 78.00% 76.50% % Reduction in Volume: 78.00% 78.00% 76.60% Classification: Grade 1 Grade 1 Grade 1 Exudate Amount: N/A Large Large Exudate Type: N/A Serous Serous Exudate Color: N/A amber amber Foul Odor After N/A Yes Yes Cleansing: Odor Anticipated Due to N/A No No Product Use: Wound Margin: N/A Flat and Intact Flat and Intact Granulation Amount: N/A Medium (34-66%) Medium (34-66%) Granulation Quality: N/A Pink Pink Necrotic Amount: N/A Medium (34-66%) Medium (34-66%) Exposed Structures: N/A Fat Layer (Subcutaneous Fat Layer (Subcutaneous Tissue) Exposed: Yes Tissue) Exposed: Yes Fascia: No Fascia: No Tendon: No Tendon: No Muscle: No Muscle: No Joint: No Joint: No Bone: No Bone: No Epithelialization: N/A Small (1-33%) Small (1-33%) Periwound Skin Texture: No Abnormalities Noted Excoriation: Yes Excoriation: Yes Induration: No Induration: No  Callus: No Callus: No Crepitus: No Crepitus: No Rash: No Rash: No Scarring: No Scarring: No Periwound Skin No Abnormalities Noted Maceration: No Maceration: No Moisture: Dry/Scaly: No Dry/Scaly: No Periwound Skin Color: No Abnormalities Noted Atrophie Blanche: No Atrophie Blanche: No Cyanosis: No Cyanosis: No Ecchymosis: No Ecchymosis:  No Erythema: No Erythema: No Hemosiderin Staining: No Hemosiderin Staining: No Mottled: No Mottled: No [name], [name] ([id number]) Pallor: No Pallor: No Rubor: No Rubor: No Temperature: N/A No Abnormality No Abnormality Tenderness on No No No Palpation: Wound Preparation: N/A Ulcer Cleansing: Ulcer Cleansing: Rinsed/Irrigated with Rinsed/Irrigated with Saline Saline Topical Anesthetic Topical Anesthetic Applied: None Applied: None Wound Number: 7 N/A N/A Photos: No Photos N/A N/A Wound Location: Left Toe Third N/A N/A Wounding Event: Gradually Appeared N/A N/A Primary Etiology: Diabetic Wound/Ulcer of N/A N/A the Lower Extremity Secondary Etiology: Pressure Ulcer N/A N/A Comorbid History: Anemia, Lymphedema, N/A N/A Congestive Heart Failure, Hypertension, Peripheral Venous Disease, Type II Diabetes, Neuropathy Date Acquired: 10/14/2016 N/A N/A Weeks of Treatment: 1 N/A N/A Wound Status: Open N/A N/A Pending Amputation on No N/A N/A Presentation: Measurements L x W x D 0.7x0.7x0.1 N/A N/A (cm) Area (cm) : 0.385 N/A N/A Volume (cm) : 0.038 N/A N/A % Reduction in Area: 74.90% N/A N/A % Reduction in Volume: 75.20% N/A N/A Classification: Grade 1 N/A N/A Exudate Amount: Large N/A N/A Exudate Type: Serous N/A N/A Exudate Color: amber N/A N/A Foul Odor After No N/A N/A Cleansing: Odor Anticipated Due to N/A N/A N/A Product Use: Wound Margin: Flat and Intact N/A N/A Granulation Amount: None Present (0%) N/A N/A Granulation Quality: N/A N/A N/A Necrotic Amount: Large (67-100%) N/A N/A Exposed Structures: Fat Layer (Subcutaneous N/A N/A Tissue) Exposed: Yes [name], [name] ([id number]) Fascia: No Tendon: No Muscle: No Joint: No Bone: No Epithelialization: None N/A N/A Periwound Skin Texture: Excoriation: Yes N/A N/A Induration: No Callus: No Crepitus: No Rash: No Scarring: No Periwound Skin Maceration: No N/A N/A Moisture: Dry/Scaly: No Periwound Skin  Color: Atrophie Blanche: No N/A N/A Cyanosis: No Ecchymosis: No Erythema: No Hemosiderin Staining: No Mottled: No Pallor: No Rubor: No Temperature: N/A N/A N/A Tenderness on No N/A N/A Palpation: Wound Preparation: Ulcer Cleansing: N/A N/A Rinsed/Irrigated with Saline Topical Anesthetic Applied: None Treatment Notes Electronic Signature(s) Signed: 10/22/2016 12:49:37 PM By: Montey Hora Entered By: Montey Hora on 10/22/2016 12:49:37 [name], [name] ([id number]) -------------------------------------------------------------------------------- Multi-Disciplinary Care Plan Details Patient Name: [name], [name] 10/22/2016 12:30 Date of Service: PM Medical Record [id number] Number: Patient Account Number: [id number] Date of Birth/Sex: 23-Aug-1950 (66 y.o. Male) Treating RN: Elyn Peers, Other Clinician: Primary Care Angella Montas: TRACY Treating STONE III, Charlynn Grimes, Sayda Grable/Extender: Referring Berish Bohman: Harless Nakayama in Treatment: 4 Active Inactive ` Abuse / Safety / Falls / Self Care Management Nursing Diagnoses: Potential for falls Goals: Patient will remain injury free related to falls Date Initiated: 09/21/2016 Target Resolution Date: 11/30/2016 Goal Status: Active Interventions: Assess fall risk on admission and as needed Notes: ` Nutrition Nursing Diagnoses: Potential for alteratiion in Nutrition/Potential for imbalanced nutrition Goals: Patient/caregiver agrees to and verbalizes understanding of need to use nutritional supplements and/or vitamins as prescribed Date Initiated: 09/21/2016 Target Resolution Date: 11/30/2016 Goal Status: Active Interventions: Assess patient nutrition upon admission and as needed per policy Notes: ` Orientation to the [facility]. ([id number]) Nursing Diagnoses: Knowledge deficit related to the wound healing center program Goals: Patient/caregiver will verbalize understanding  of the [facility] Program Date Initiated: 09/21/2016 Target Resolution  Date: 11/30/2016 Goal Status: Active Interventions: Provide education on orientation to the wound center Notes: ` Wound/Skin Impairment Nursing Diagnoses: Knowledge deficit related to smoking impact on wound healing Goals: Ulcer/skin breakdown will have a volume reduction of 30% by week 4 Date Initiated: 09/21/2016 Target Resolution Date: 11/30/2016 Goal Status: Active Ulcer/skin breakdown will have a volume reduction of 50% by week 8 Date Initiated: 09/21/2016 Target Resolution Date: 11/30/2016 Goal Status: Active Ulcer/skin breakdown will have a volume reduction of 80% by week 12 Date Initiated: 09/21/2016 Target Resolution Date: 11/30/2016 Goal Status: Active Ulcer/skin breakdown will heal within 14 weeks Date Initiated: 09/21/2016 Target Resolution Date: 11/30/2016 Goal Status: Active Interventions: Assess patient/caregiver ability to obtain necessary supplies Assess patient/caregiver ability to perform ulcer/skin care regimen upon admission and as needed Assess ulceration(s) every visit Notes: Electronic Signature(s) Signed: 10/22/2016 12:49:25 PM By: Montey Hora Entered By: Montey Hora on 10/22/2016 12:49:24 [name], [name] ([id number]) -------------------------------------------------------------------------------- Pain Assessment Details Patient Name: [name] 10/22/2016 12:30 Date of Service: PM Medical Record [id number] Number: Patient Account Number: [id number] Date of Birth/Sex: 09/13/1950 (66 y.o. Male) Treating RN: Elyn Peers, Other Clinician: Primary Care Trayven Lumadue: Olivia Mackie Treating STONE III, HOYT MCLEAN-SCOCOZZA, Kayde Atkerson/Extender: Referring Charlynn Salih: Harless Nakayama in Treatment: 4 Active Problems Location of Pain Severity and Description of Pain Patient Has Paino No Site Locations Pain Management and Medication Current Pain  Management: Notes Topical or injectable lidocaine is offered to patient for acute pain when surgical debridement is performed. If needed, Patient is instructed to use over the counter pain medication for the following 24-48 hours after debridement. Wound care MDs do not prescribed pain medications. Patient has chronic pain or uncontrolled pain. Patient has been instructed to make an appointment with their Primary Care Physician for pain management. Electronic Signature(s) Signed: 10/22/2016 5:00:03 PM By: Montey Hora Entered By: Montey Hora on 10/22/2016 12:35:18 [name], [name] ([id number]) -------------------------------------------------------------------------------- Patient/Caregiver Education Details Patient Name: ADYEN, BIFULCO 10/22/2016 12:30 Date of Service: PM Medical Record [id number] Number: Patient Account Number: [id number] Date of Birth/Gender: 20-Mar-1951 (66 y.o. Male) Treating RN: Montey Hora Primary Care MCLEAN-SCOCOZZA, Other Clinician: Physician: Olivia Mackie Treating STONE III, Charlynn Grimes, Physician/Extender: Referring Physician: Harless Nakayama in Treatment: 4 Education Assessment Education Provided To: Patient Education Topics Provided Wound/Skin Impairment: Handouts: Other: wound care as ordered Methods: Demonstration, Explain/Verbal Responses: State content correctly Electronic Signature(s) Signed: 10/22/2016 5:00:03 PM By: Montey Hora Entered By: Montey Hora on 10/22/2016 12:50:21 Steib, [name] ([id number]) -------------------------------------------------------------------------------- Wound Assessment Details Patient Name: [name] 10/22/2016 12:30 Date of Service: PM Medical Record [id number] Number: Patient Account Number: [id number] Date of Birth/Sex: 1950-10-10 (66 y.o. Male) Treating RN: Elyn Peers, Other Clinician: Primary Care Abshir Paolini: TRACY Treating STONE III, HOYT MCLEAN-SCOCOZZA,  Gaia Gullikson/Extender: Referring Debie Ashline: Harless Nakayama in Treatment: 4 Wound Status Wound Number: 1 Primary Diabetic Wound/Ulcer of the Lower Etiology: Extremity Wound Location: Right Toe Great Wound Open Wounding Event: Gradually Appeared Status: Date Acquired: 06/11/2016 Comorbid Anemia, Lymphedema, Congestive Weeks Of Treatment: 4 History: Heart Failure, Hypertension, Peripheral Clustered Wound: No Venous Disease, Type II Diabetes, Pending Amputation On Presentation Neuropathy Photos Photo Uploaded By: Montey Hora on 10/22/2016 12:56:04 Wound Measurements Length: (cm) 4 Width: (cm) 5.5 Depth: (cm) 0.1 Area: (cm) 17.279 Volume: (cm) 1.728 % Reduction in Area: 63.3% % Reduction in Volume: 63.3% Epithelialization: Large (67-100%) Tunneling: No Undermining: No Wound Description Classification: Grade 1 Foul Odor After Wound Margin: Flat and Intact Due to Product Exudate Amount: Large Slough/Fibrino Exudate Type: Serous Exudate Color: amber  Cleansing: Yes Use: No Yes Wound Bed Baller, Jaylend [name]. ([id number]) Granulation Amount: Large (67-100%) Exposed Structure Granulation Quality: Pink Fascia Exposed: No Necrotic Amount: Small (1-33%) Fat Layer (Subcutaneous Tissue) Exposed: Yes Necrotic Quality: Adherent Slough Tendon Exposed: No Muscle Exposed: No Joint Exposed: No Bone Exposed: No Periwound Skin Texture Texture Color No Abnormalities Noted: No No Abnormalities Noted: No Callus: No Atrophie Blanche: No Crepitus: No Cyanosis: No Excoriation: Yes Ecchymosis: No Induration: No Erythema: No Rash: No Hemosiderin Staining: No Scarring: No Mottled: No Pallor: No Moisture Rubor: No No Abnormalities Noted: No Dry / Scaly: No Temperature / Pain Maceration: Yes Temperature: No Abnormality Wound Preparation Ulcer Cleansing: Rinsed/Irrigated with Saline Topical Anesthetic Applied: None Electronic Signature(s) Signed: 10/22/2016 5:00:03 PM By: Montey Hora Entered By: Montey Hora on 10/22/2016 12:45:47 [name], [name] ([id number]) -------------------------------------------------------------------------------- Wound Assessment Details Patient Name: [name] 10/22/2016 12:30 Date of Service: PM Medical Record [id number] Number: Patient Account Number: [id number] Date of Birth/Sex: Mar 25, 1951 (66 y.o. Male) Treating RN: Elyn Peers, Other Clinician: Primary Care Jaxyn Rout: TRACY Treating STONE III, HOYT MCLEAN-SCOCOZZA, Naarah Borgerding/Extender: Referring Zoanne Newill: Harless Nakayama in Treatment: 4 Wound Status Wound Number: 2 Primary Diabetic Wound/Ulcer of the Lower Etiology: Extremity Wound Location: Right Toe Second Wound Open Wounding Event: Gradually Appeared Status: Date Acquired: 06/11/2016 Comorbid Anemia, Lymphedema, Congestive Weeks Of Treatment: 4 History: Heart Failure, Hypertension, Peripheral Clustered Wound: No Venous Disease, Type II Diabetes, Pending Amputation On Presentation Neuropathy Photos Photo Uploaded By: Montey Hora on 10/22/2016 12:51:36 Wound Measurements Length: (cm) 0.5 Width: (cm) 0.6 Depth: (cm) 0.1 Area: (cm) 0.236 Volume: (cm) 0.024 % Reduction in Area: 92.3% % Reduction in Volume: 92.2% Epithelialization: Small (1-33%) Tunneling: No Undermining: No Wound Description Classification: Grade 1 Foul Odor After Wound Margin: Flat and Intact Due to Product Exudate Amount: Large Slough/Fibrino Exudate Type: Serous Exudate Color: amber Cleansing: Yes Use: No Yes Wound Bed Cawood, Fredrico [name]. ([id number]) Granulation Amount: Medium (34-66%) Exposed Structure Granulation Quality: Pink Fascia Exposed: No Necrotic Amount: Medium (34-66%) Fat Layer (Subcutaneous Tissue) Exposed: Yes Necrotic Quality: Adherent Slough Tendon Exposed: No Muscle Exposed: No Joint Exposed: No Bone Exposed: No Periwound Skin Texture Texture Color No Abnormalities Noted: No No  Abnormalities Noted: No Callus: No Atrophie Blanche: No Crepitus: No Cyanosis: No Excoriation: Yes Ecchymosis: No Induration: No Erythema: No Rash: No Hemosiderin Staining: No Scarring: No Mottled: No Pallor: No Moisture Rubor: No No Abnormalities Noted: No Dry / Scaly: No Temperature / Pain Maceration: No Temperature: No Abnormality Wound Preparation Ulcer Cleansing: Rinsed/Irrigated with Saline Topical Anesthetic Applied: None Electronic Signature(s) Signed: 10/22/2016 5:00:03 PM By: Montey Hora Entered By: Montey Hora on 10/22/2016 12:46:06 Rozar, [name] ([id number]) -------------------------------------------------------------------------------- Wound Assessment Details Patient Name: [name] 10/22/2016 12:30 Date of Service: PM Medical Record [id number] Number: Patient Account Number: [id number] Date of Birth/Sex: 1950-04-23 (66 y.o. Male) Treating RN: Elyn Peers, Other Clinician: Primary Care Dozier Berkovich: TRACY Treating STONE III, HOYT MCLEAN-SCOCOZZA, Nikolis Berent/Extender: Referring Katharina Jehle: Harless Nakayama in Treatment: 4 Wound Status Wound Number: 3 Primary Diabetic Wound/Ulcer of the Lower Etiology: Extremity Wound Location: Right Toe Third Wound Open Wounding Event: Gradually Appeared Status: Date Acquired: 06/11/2016 Comorbid Anemia, Lymphedema, Congestive Weeks Of Treatment: 4 History: Heart Failure, Hypertension, Peripheral Clustered Wound: No Venous Disease, Type II Diabetes, Pending Amputation On Presentation Neuropathy Photos Photo Uploaded By: Montey Hora on 10/22/2016 12:51:37 Wound Measurements Length: (cm) 0.1 Width: (cm) 0.1 Depth: (cm) 0.1 Area: (cm) 0.008 Volume: (cm) 0.001 % Reduction in Area: 98.4% % Reduction  in Volume: 98% Epithelialization: Small (1-33%) Tunneling: No Undermining: No Wound Description Classification: Grade 1 Foul Odor After Wound Margin: Flat and Intact Due to  Product Exudate Amount: Large Slough/Fibrino Exudate Type: Serous Exudate Color: amber Cleansing: Yes Use: No Yes Wound Bed [name], Cyruss [name]. ([id number]) Granulation Amount: Large (67-100%) Exposed Structure Granulation Quality: Pink Fascia Exposed: No Necrotic Amount: Small (1-33%) Fat Layer (Subcutaneous Tissue) Exposed: Yes Necrotic Quality: Adherent Slough Tendon Exposed: No Muscle Exposed: No Joint Exposed: No Bone Exposed: No Periwound Skin Texture Texture Color No Abnormalities Noted: No No Abnormalities Noted: No Callus: No Atrophie Blanche: No Crepitus: No Cyanosis: No Excoriation: Yes Ecchymosis: No Induration: No Erythema: No Rash: No Hemosiderin Staining: No Scarring: No Mottled: No Pallor: No Moisture Rubor: No No Abnormalities Noted: No Dry / Scaly: No Temperature / Pain Maceration: No Temperature: No Abnormality Wound Preparation Ulcer Cleansing: Rinsed/Irrigated with Saline Topical Anesthetic Applied: None Electronic Signature(s) Signed: 10/22/2016 5:00:03 PM By: Montey Hora Entered By: Montey Hora on 10/22/2016 12:46:23 Barre, [name] ([id number]) -------------------------------------------------------------------------------- Wound Assessment Details Patient Name: [name] 10/22/2016 12:30 Date of Service: PM Medical Record [id number] Number: Patient Account Number: [id number] Date of Birth/Sex: 01/23/1951 (66 y.o. Male) Treating RN: Elyn Peers, Other Clinician: Primary Care Serah Nicoletti: TRACY Treating STONE III, HOYT MCLEAN-SCOCOZZA, Geni Skorupski/Extender: Referring Hansen Carino: Harless Nakayama in Treatment: 4 Wound Status Wound Number: 4 Primary Diabetic Wound/Ulcer of the Lower Etiology: Extremity Wound Location: Right, Dorsal Foot Wound Status: Open Wounding Event: Gradually Appeared Date Acquired: 06/11/2016 Weeks Of Treatment: 4 Clustered Wound: No Pending Amputation On Presentation Wound  Measurements Length: (cm) 0.5 Width: (cm) 2.5 Depth: (cm) 0.1 Area: (cm) 0.982 Volume: (cm) 0.098 % Reduction in Area: 77.9% % Reduction in Volume: 78% Wound Description Classification: Grade 1 Periwound Skin Texture Texture Color No Abnormalities Noted: No No Abnormalities Noted: No Moisture No Abnormalities Noted: No Electronic Signature(s) Signed: 10/22/2016 5:00:03 PM By: Montey Hora Entered By: Montey Hora on 10/22/2016 12:45:30 Pendergrass, [name] ([id number]) -------------------------------------------------------------------------------- Wound Assessment Details Patient Name: [name] 10/22/2016 12:30 Date of Service: PM Medical Record [id number] Number: Patient Account Number: [id number] Date of Birth/Sex: 1950/11/09 (66 y.o. Male) Treating RN: Elyn Peers, Other Clinician: Primary Care Shikara Mcauliffe: TRACY Treating STONE III, HOYT MCLEAN-SCOCOZZA, Mohammed Mcandrew/Extender: Referring Fernando Torry: Harless Nakayama in Treatment: 4 Wound Status Wound Number: 5 Primary Diabetic Wound/Ulcer of the Lower Etiology: Extremity Wound Location: Left Toe Great Wound Open Wounding Event: Gradually Appeared Status: Date Acquired: 06/11/2016 Comorbid Anemia, Lymphedema, Congestive Weeks Of Treatment: 4 History: Heart Failure, Hypertension, Peripheral Clustered Wound: No Venous Disease, Type II Diabetes, Pending Amputation On Presentation Neuropathy Photos Photo Uploaded By: Montey Hora on 10/22/2016 12:52:59 Wound Measurements Length: (cm) 1 Width: (cm) 4 Depth: (cm) 0.1 Area: (cm) 3.142 Volume: (cm) 0.314 % Reduction in Area: 78% % Reduction in Volume: 78% Epithelialization: Small (1-33%) Tunneling: No Undermining: No Wound Description Classification: Grade 1 Foul Odor After Wound Margin: Flat and Intact Due to Product Exudate Amount: Large Slough/Fibrino Exudate Type: Serous Exudate Color: amber Cleansing: Yes Use: No Yes Wound  Bed Moncada, [place] [name]. ([id number]) Granulation Amount: Medium (34-66%) Exposed Structure Granulation Quality: Pink Fascia Exposed: No Necrotic Amount: Medium (34-66%) Fat Layer (Subcutaneous Tissue) Exposed: Yes Necrotic Quality: Adherent Slough Tendon Exposed: No Muscle Exposed: No Joint Exposed: No Bone Exposed: No Periwound Skin Texture Texture Color No Abnormalities Noted: No No Abnormalities Noted: No Callus: No Atrophie Blanche: No Crepitus: No Cyanosis: No Excoriation: Yes Ecchymosis: No Induration: No Erythema: No Rash: No  Hemosiderin Staining: No Scarring: No Mottled: No Pallor: No Moisture Rubor: No No Abnormalities Noted: No Dry / Scaly: No Temperature / Pain Maceration: No Temperature: No Abnormality Wound Preparation Ulcer Cleansing: Rinsed/Irrigated with Saline Topical Anesthetic Applied: None Electronic Signature(s) Signed: 10/22/2016 5:00:03 PM By: Montey Hora Entered By: Montey Hora on 10/22/2016 12:46:40 Kendrix, [name] ([id number]) -------------------------------------------------------------------------------- Wound Assessment Details Patient Name: [name] 10/22/2016 12:30 Date of Service: PM Medical Record [id number] Number: Patient Account Number: [id number] Date of Birth/Sex: [birth year] (66 y.o. Male) Treating RN: Elyn Peers, Other Clinician: Primary Care Roya Gieselman: TRACY Treating STONE III, HOYT MCLEAN-SCOCOZZA, Shanoah Asbill/Extender: Referring Geordan Xu: Harless Nakayama in Treatment: 4 Wound Status Wound Number: 6 Primary Diabetic Wound/Ulcer of the Lower Etiology: Extremity Wound Location: Left Toe Second Wound Open Wounding Event: Gradually Appeared Status: Date Acquired: 06/11/2016 Comorbid Anemia, Lymphedema, Congestive Weeks Of Treatment: 4 History: Heart Failure, Hypertension, Peripheral Clustered Wound: No Venous Disease, Type II Diabetes, Pending Amputation On  Presentation Neuropathy Photos Photo Uploaded By: Montey Hora on 10/22/2016 12:54:44 Wound Measurements Length: (cm) 0.8 Width: (cm) 0.8 Depth: (cm) 0.1 Area: (cm) 0.503 Volume: (cm) 0.05 % Reduction in Area: 76.5% % Reduction in Volume: 76.6% Epithelialization: Small (1-33%) Tunneling: No Undermining: No Wound Description Classification: Grade 1 Foul Odor After Wound Margin: Flat and Intact Due to Product Exudate Amount: Large Slough/Fibrino Exudate Type: Serous Exudate Color: amber Cleansing: Yes Use: No Yes Wound Bed Zavaleta, Rastus [name]. ([id number]) Granulation Amount: Medium (34-66%) Exposed Structure Granulation Quality: Pink Fascia Exposed: No Necrotic Amount: Medium (34-66%) Fat Layer (Subcutaneous Tissue) Exposed: Yes Necrotic Quality: Adherent Slough Tendon Exposed: No Muscle Exposed: No Joint Exposed: No Bone Exposed: No Periwound Skin Texture Texture Color No Abnormalities Noted: No No Abnormalities Noted: No Callus: No Atrophie Blanche: No Crepitus: No Cyanosis: No Excoriation: Yes Ecchymosis: No Induration: No Erythema: No Rash: No Hemosiderin Staining: No Scarring: No Mottled: No Pallor: No Moisture Rubor: No No Abnormalities Noted: No Dry / Scaly: No Temperature / Pain Maceration: No Temperature: No Abnormality Wound Preparation Ulcer Cleansing: Rinsed/Irrigated with Saline Topical Anesthetic Applied: None Electronic Signature(s) Signed: 10/22/2016 5:00:03 PM By: Montey Hora Entered By: Montey Hora on 10/22/2016 12:46:56 Rape, [name] ([id number]) -------------------------------------------------------------------------------- Wound Assessment Details Patient Name: [name] 10/22/2016 12:30 Date of Service: PM Medical Record [id number] Number: Patient Account Number: [id number] Date of Birth/Sex: [birth year] (66 y.o. Male) Treating RN: Elyn Peers, Other Clinician: Primary Care Roselynne Lortz: TRACY  Treating STONE III, HOYT MCLEAN-SCOCOZZA, Kenyana Husak/Extender: Referring Vandy Tsuchiya: Harless Nakayama in Treatment: 4 Wound Status Wound Number: 7 Primary Diabetic Wound/Ulcer of the Lower Etiology: Extremity Wound Location: Left Toe Third Secondary Pressure Ulcer Wounding Event: Gradually Appeared Etiology: Date Acquired: 10/14/2016 Wound Open Weeks Of Treatment: 1 Status: Clustered Wound: No Comorbid Anemia, Lymphedema, Congestive History: Heart Failure, Hypertension, Peripheral Venous Disease, Type II Diabetes, Neuropathy Photos Photo Uploaded By: Montey Hora on 10/22/2016 12:54:44 Wound Measurements Length: (cm) 0.7 Width: (cm) 0.7 Depth: (cm) 0.1 Area: (cm) 0.385 Volume: (cm) 0.038 % Reduction in Area: 74.9% % Reduction in Volume: 75.2% Epithelialization: None Tunneling: No Undermining: No Wound Description Classification: Grade 1 Foul Odor After Wound Margin: Flat and Intact Slough/Fibrino Exudate Amount: Large Exudate Type: Serous Exudate Color: amber Benedict, Demontay [name]. ([id number]) Cleansing: No Yes Wound Bed Granulation Amount: None Present (0%) Exposed Structure Necrotic Amount: Large (67-100%) Fascia Exposed: No Necrotic Quality: Adherent Slough Fat Layer (Subcutaneous Tissue) Exposed: Yes Tendon Exposed: No Muscle Exposed: No Joint Exposed: No Bone Exposed: No Periwound Skin Texture Texture  Color No Abnormalities Noted: No No Abnormalities Noted: No Callus: No Atrophie Blanche: No Crepitus: No Cyanosis: No Excoriation: Yes Ecchymosis: No Induration: No Erythema: No Rash: No Hemosiderin Staining: No Scarring: No Mottled: No Pallor: No Moisture Rubor: No No Abnormalities Noted: No Dry / Scaly: No Maceration: No Wound Preparation Ulcer Cleansing: Rinsed/Irrigated with Saline Topical Anesthetic Applied: None Electronic Signature(s) Signed: 10/22/2016 5:00:03 PM By: Montey Hora Entered By: Montey Hora on 10/22/2016 12:47:13 Schlereth,  [name] ([id number]) -------------------------------------------------------------------------------- Vitals Details Patient Name: [name] 10/22/2016 12:30 Date of Service: PM Medical Record [id number] Number: Patient Account Number: [id number] Date of Birth/Sex: September 30, 1950 (66 y.o. Male) Treating RN: Elyn Peers, Other Clinician: Primary Care Gearldene Fiorenza: TRACY Treating STONE III, HOYT Burns, Trishelle Devora/Extender: Referring Montarius Kitagawa: Harless Nakayama in Treatment: 4 Vital Signs Time Taken: 12:36 Temperature (F): 98.1 Height (in): 69 Pulse (bpm): 66 Weight (lbs): 168 Respiratory Rate (breaths/min): 16 Body Mass Index (BMI): 24.8 Blood Pressure (mmHg): 154/69 Reference Range: 80 - 120 mg / dl Electronic Signature(s) Signed: 10/22/2016 5:00:03 PM By: Montey Hora Entered By: Montey Hora on 10/22/2016 12:36:59

## 2016-10-25 ENCOUNTER — Encounter: Payer: Self-pay | Admitting: Oncology

## 2016-10-25 ENCOUNTER — Inpatient Hospital Stay: Payer: Medicare HMO | Attending: Oncology

## 2016-10-25 ENCOUNTER — Inpatient Hospital Stay (HOSPITAL_BASED_OUTPATIENT_CLINIC_OR_DEPARTMENT_OTHER): Payer: Medicare HMO | Admitting: Oncology

## 2016-10-25 VITALS — BP 169/81 | HR 64 | Temp 97.4°F | Resp 20 | Ht 69.5 in | Wt 162.9 lb

## 2016-10-25 DIAGNOSIS — K219 Gastro-esophageal reflux disease without esophagitis: Secondary | ICD-10-CM | POA: Diagnosis not present

## 2016-10-25 DIAGNOSIS — Z85820 Personal history of malignant melanoma of skin: Secondary | ICD-10-CM | POA: Diagnosis not present

## 2016-10-25 DIAGNOSIS — R599 Enlarged lymph nodes, unspecified: Secondary | ICD-10-CM | POA: Insufficient documentation

## 2016-10-25 DIAGNOSIS — I110 Hypertensive heart disease with heart failure: Secondary | ICD-10-CM | POA: Diagnosis not present

## 2016-10-25 DIAGNOSIS — D509 Iron deficiency anemia, unspecified: Secondary | ICD-10-CM | POA: Insufficient documentation

## 2016-10-25 DIAGNOSIS — D472 Monoclonal gammopathy: Secondary | ICD-10-CM | POA: Diagnosis not present

## 2016-10-25 DIAGNOSIS — Z923 Personal history of irradiation: Secondary | ICD-10-CM

## 2016-10-25 DIAGNOSIS — F17210 Nicotine dependence, cigarettes, uncomplicated: Secondary | ICD-10-CM

## 2016-10-25 DIAGNOSIS — I509 Heart failure, unspecified: Secondary | ICD-10-CM | POA: Diagnosis not present

## 2016-10-25 DIAGNOSIS — C44529 Squamous cell carcinoma of skin of other part of trunk: Secondary | ICD-10-CM

## 2016-10-25 DIAGNOSIS — Z7982 Long term (current) use of aspirin: Secondary | ICD-10-CM | POA: Diagnosis not present

## 2016-10-25 DIAGNOSIS — Z7984 Long term (current) use of oral hypoglycemic drugs: Secondary | ICD-10-CM | POA: Insufficient documentation

## 2016-10-25 DIAGNOSIS — E119 Type 2 diabetes mellitus without complications: Secondary | ICD-10-CM | POA: Diagnosis not present

## 2016-10-25 DIAGNOSIS — R590 Localized enlarged lymph nodes: Secondary | ICD-10-CM

## 2016-10-25 DIAGNOSIS — Z79899 Other long term (current) drug therapy: Secondary | ICD-10-CM

## 2016-10-25 DIAGNOSIS — D649 Anemia, unspecified: Secondary | ICD-10-CM

## 2016-10-25 DIAGNOSIS — R591 Generalized enlarged lymph nodes: Secondary | ICD-10-CM

## 2016-10-25 DIAGNOSIS — R188 Other ascites: Secondary | ICD-10-CM | POA: Diagnosis not present

## 2016-10-25 LAB — IRON AND TIBC
Iron: 146 ug/dL (ref 45–182)
SATURATION RATIOS: 57 % — AB (ref 17.9–39.5)
TIBC: 256 ug/dL (ref 250–450)
UIBC: 111 ug/dL

## 2016-10-25 LAB — CBC
HEMATOCRIT: 23.3 % — AB (ref 40.0–52.0)
Hemoglobin: 8 g/dL — ABNORMAL LOW (ref 13.0–18.0)
MCH: 30.8 pg (ref 26.0–34.0)
MCHC: 34.5 g/dL (ref 32.0–36.0)
MCV: 89.3 fL (ref 80.0–100.0)
Platelets: 214 10*3/uL (ref 150–440)
RBC: 2.61 MIL/uL — AB (ref 4.40–5.90)
RDW: 14.7 % — ABNORMAL HIGH (ref 11.5–14.5)
WBC: 6.9 10*3/uL (ref 3.8–10.6)

## 2016-10-25 LAB — FERRITIN: Ferritin: 131 ng/mL (ref 24–336)

## 2016-10-25 NOTE — Progress Notes (Signed)
Hematology/Oncology Consult note [facility]  [phone] [phone]  Patient Care Team: McLean-Scocuzza, [name] Glow, MD as PCP - General (Internal Medicine)   Name of the patient: [name]  [id number]  01/17/1951   Date of visit: 10/25/16  Diagnosis- 1. Intraabdominal adenopathy 2. 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 Vicente [name]. 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 Baruch [name]. Patient had CT abdomen in July 2016 which showed retroperitoneal, celiac and gastric adenopathy. MR pelvis in Oct 2017 showed b/l inguinal and external iliac adenopathy as well. Inguinal LN biopsy was negative for malignancy.   2. Biopsy of the skin lesion showed: DIAGNOSIS:  A. PERIANAL POLYP, LEFT; EXCISION:  - SKIN WITH POLYPOID VASCULAR PROLIFERATION WITH SUPERFICIAL NECROSIS,  SEE COMMENT.   B. PERIANAL MASS, RIGHT; INCISIONAL BIOPSY:  - INVASIVE CARCINOMA WITH BASALOID FEATURES.  - SEE COMMENT.   Comment #1:  The differential diagnosis for the perianal polyp includes pyogenic  granuloma and ulcerated hemangioma.   Comment #2:  Immunohistochemical stains were performed. The carcinoma is positive for  p16, BerEp4, and BCL-2. Stain controls worked appropriately.  Unfortunately the pattern of staining does not assist with further  classification of the carcinoma. Clinical correlation is required to  determine if the lesion involves perianal skin primarily (BCC) or is  arising in the anal canal (squamous cell carcinoma with basaloid  features).   This was followed by RT to the lesion.  3. He had repeat CT abdomen in feb 2018 which showed: IMPRESSION: Progressive hepatic cirrhosis since prior study. No evidence of hepatic neoplasm.  New moderate  ascites, diffuse mesenteric and body wall edema, and small bilateral pleural effusions. Mild diffuse small bowel wall thickening, consistent with hypoalbuminemia.  Findings consistent with portal venous hypertension, including mild splenomegaly and upper abdominal venous collaterals.  Stable mild abdominal lymphadenopathy. Mild increase in bilateral iliac and inguinal lymphadenopathy in the pelvis, which may be reactive in etiology, with metastatic disease considered less Likely.  4. Ascites recently drained. He is get egd and colonoscopy for his microcytic anemia  5. CBC on 09/18/2016 showed H&H of 6.5/19.8 and MCV of 86.7. Labs suggestive of iron deficiency anemia. B12 and folate was within normal limits. Multiple myeloma panel showed IGG monoclonal protein of 0.9 gm. Polyclonal gammopathy Haptoglobin and no copper was normal. Reticulocyte count was mildly elevated at 7.9. Patient received 2 doses of ferriheme on 620 and 10/03/2016. He also received 1 unit of blood transfusion  6. EGD showed a normal duodenum and normal esophagus. Found to have gastritis which was biopsied. Also found to have a large polypoid mass in the left piriform sinus and nonobstructing the airway. This was subsequently evaluated by ENT which did not find any evidence of mass. The colonoscopy showed appendix that was appearing protruding and ordered this blood with stool. No mucus seen in the vicinity. He was evaluated by a [facility] and has been considered for a repeat colonoscopy versus endoscopy mucosal resection of the mucocele  Interval history- leg swelling is better. Reports chronic fatigue. Denies other complaints  ECOG PS- 2 Pain scale- 0   Review of systems- Review of Systems  Constitutional: Positive for malaise/fatigue. Negative for chills, fever and weight loss.  HENT: Negative for congestion,  ear discharge and nosebleeds.   Eyes: Negative for blurred vision.  Respiratory: Negative for cough,  hemoptysis, sputum production, shortness of breath and wheezing.   Cardiovascular: Positive for leg swelling. Negative for chest pain, palpitations, orthopnea and claudication.  Gastrointestinal: Negative for abdominal pain, blood in stool, constipation, diarrhea, heartburn, melena, nausea and vomiting.  Genitourinary: Negative for dysuria, flank pain, frequency, hematuria and urgency.  Musculoskeletal: Negative for back pain, joint pain and myalgias.  Skin: Negative for rash.  Neurological: Negative for dizziness, tingling, focal weakness, seizures, weakness and headaches.  Endo/Heme/Allergies: Does not bruise/bleed easily.  Psychiatric/Behavioral: Negative for depression and suicidal ideas. The patient does not have insomnia.        No Known Allergies   Past Medical History:  Diagnosis Date  . CHF (congestive heart failure) ([place])   . Diabetes mellitus    Patient take Metformin.  Marland Kitchen GERD (gastroesophageal reflux disease)   . Hypertension   . Leg swelling   . Loose, teeth    PATIENT STATES "HAS 6 TEETH, SOME ARE LOOSE"  . Neuromuscular disorder ([place])    RIGHT HAND AND FINGERS NUMB  . Shortness of breath dyspnea   . 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 02/24/2016     Past Surgical History:  Procedure Laterality Date  . APPENDECTOMY    . COLONOSCOPY WITH PROPOFOL N/A 11/19/2014   Procedure: COLONOSCOPY WITH PROPOFOL;  Surgeon: Lucilla Lame, MD;  Location: [facility];  Service: Endoscopy;  Laterality: N/A;  DIABETIC-ORAL MEDS  . COLONOSCOPY WITH PROPOFOL N/A 10/04/2016   Procedure: COLONOSCOPY WITH PROPOFOL;  Surgeon: Jonathon Bellows, MD;  Location[facility] ENDOSCOPY;  Service: Endoscopy;  Laterality: N/A;  . ESOPHAGOGASTRODUODENOSCOPY (EGD) WITH PROPOFOL N/A 10/04/2016   Procedure: ESOPHAGOGASTRODUODENOSCOPY (EGD) WITH PROPOFOL;  Surgeon: Jonathon Bellows, MD;  Location: [facility] ENDOSCOPY;  Service: Endoscopy;  Laterality: N/A;  . INGUINAL LYMPH NODE BIOPSY Right 01/26/2016    Procedure: INGUINAL LYMPH NODE BIOPSY;  Surgeon: Clayburn Pert, MD;  Location: [facility] ORS;  Service: General;  Laterality: Right;  . RECTAL BIOPSY N/A 11/02/2014   Procedure: BIOPSY RECTAL;  Surgeon: Marlyce Huge, MD;  Location: [facility] ORS;  Service: General;  Laterality: N/A;  . RECTAL EXAM UNDER ANESTHESIA N/A 11/02/2014   Procedure: RECTAL EXAM UNDER ANESTHESIA;  Surgeon: Marlyce Huge, MD;  Location: [facility] ORS;  Service: General;  Laterality: N/A;  . TONSILLECTOMY      Social History   Social History  . Marital status: Divorced    Spouse name: N/A  . Number of children: N/A  . Years of education: N/A   Occupational History  . Not on file.   Social History Main Topics  . Smoking status: Current Every Day Smoker    Packs/day: 0.25    Years: 20.00    Types: Cigarettes  . Smokeless tobacco: Never Used     Comment: pt claims no smoking x 3 d  . Alcohol use No     Comment: per pt used to drink beer daily- quit may/2018  . Drug use: Yes    Types: Marijuana     Comment: Last Use 2016 per patient  . Sexual activity: Not on file   Other Topics Concern  . Not on file   Social History Narrative  . No narrative on file    Family History  Problem Relation Age of Onset  . Asthma Mother      Current Outpatient Prescriptions:  .  albuterol (PROVENTIL HFA;VENTOLIN HFA) 108 (90 Base) MCG/ACT inhaler, Inhale  into the lungs., Disp: , Rfl:  .  amLODipine (NORVASC) 10 MG tablet, Take 1 tablet (10 mg total) by mouth daily., Disp: 30 tablet, Rfl: 0 .  aspirin EC 81 MG tablet, Take 81 mg by mouth daily., Disp: , Rfl:  .  atorvastatin (LIPITOR) 40 MG tablet, Take 40 mg by mouth at bedtime., Disp: , Rfl:  .  HYDROcodone-acetaminophen (NORCO) 5-325 MG tablet, Take 1 tablet by mouth every 6 (six) hours as needed for moderate pain., Disp: 30 tablet, Rfl: 0 .  hydrOXYzine (ATARAX/VISTARIL) 25 MG tablet, Take 25 mg by mouth 3 times daily as needed., Disp: , Rfl:  .  isosorbide  mononitrate (IMDUR) 30 MG 24 hr tablet, Take 30 mg by mouth daily. , Disp: , Rfl:  .  lisinopril (PRINIVIL,ZESTRIL) 40 MG tablet, Take 40 mg by mouth once daily., Disp: , Rfl:  .  metFORMIN (GLUCOPHAGE) 1000 MG tablet, Take 1,000 mg by mouth 2 (two) times daily with a meal., Disp: , Rfl:  .  silver sulfADIAZINE (SILVADENE) 1 % cream, Apply 1 application topically 2 (two) times daily. (Patient not taking: Reported on 08/28/2016), Disp: 50 g, Rfl: 2 .  spironolactone (ALDACTONE) 25 MG tablet, Take 25 mg by mouth daily. , Disp: , Rfl:  .  torsemide (DEMADEX) 20 MG tablet, Take 20 mg by mouth daily. , Disp: , Rfl:   Current Facility-Administered Medications:  .  albumin human 25 % solution 50 g, 50 g, Intravenous, Once, Jonathon Bellows, MD .  albumin human 25 % solution 50 g, 50 g, Intravenous, Once, Jonathon Bellows, MD  Physical exam:  Vitals:   10/25/16 1535  BP: (!) 169/81  Pulse: 64  Resp: 20  Temp: (!) 97.4 F (36.3 C)  TempSrc: Tympanic  Weight: 162 lb 14.4 oz (73.9 kg)  Height: 5' 9.5" (1.765 m)   Physical Exam  Constitutional: He is oriented to person, place, and time and well-developed, well-nourished, and in no distress.  HENT:  Head: Normocephalic and atraumatic.  Eyes: Pupils are equal, round, and reactive to light. EOM are normal.  Neck: Normal range of motion.  Cardiovascular: Normal rate, regular rhythm and normal heart sounds.   Pulmonary/Chest: Effort normal and breath sounds normal.  Abdominal: Soft. Bowel sounds are normal. He exhibits distension.  Musculoskeletal: He exhibits edema.  LE is dry and hyperpigmented  Neurological: He is alert and oriented to person, place, and time.  Skin: Skin is warm and dry.     CMP Latest Ref Rng & Units 09/18/2016  Glucose 65 - 99 mg/dL 109(H)  BUN 6 - 20 mg/dL 21(H)  Creatinine 0.61 - 1.24 mg/dL 1.63(H)  Sodium 135 - 145 mmol/L 129(L)  Potassium 3.5 - 5.1 mmol/L 4.3  Chloride 101 - 111 mmol/L 98(L)  CO2 22 - 32 mmol/L 25  Calcium  8.9 - 10.3 mg/dL 8.3(L)  Total Protein 6.5 - 8.1 g/dL 7.4  Total Bilirubin 0.3 - 1.2 mg/dL 0.4  Alkaline Phos 38 - 126 U/L 65  AST 15 - 41 U/L 38  ALT 17 - 63 U/L 19   CBC Latest Ref Rng & Units 09/18/2016  WBC 3.8 - 10.6 K/uL 6.8  Hemoglobin 13.0 - 18.0 g/dL 6.5(L)  Hematocrit 40.0 - 52.0 % 19.8(L)  Platelets 150 - 440 K/uL 358    No images are attached to the encounter.  US Abdomen Limited  Result Date: 10/01/2016 CLINICAL DATA:  Ascites EXAM: LIMITED ABDOMEN ULTRASOUND FOR ASCITES TECHNIQUE: Limited ultrasound survey for ascites was  performed in all four abdominal quadrants. COMPARISON:  None. FINDINGS: There is very minimal ascites in the abdomen, seen best in the right upper quadrant adjacent liver. Paracentesis was not performed. IMPRESSION: Minimal ascites.  Paracentesis was not performed. Electronically Signed   By: Marybelle Killings M.D.   On: 10/01/2016 13:21     Assessment and plan- Patient is a 66 y.o. male who sees me for following issues:  1. Iron deficiency anemia- cause unclear. He may need capsule endoscopy to complete his work up. Check cbc today. rtc in 1 month with cbc, ferritin and iron studies. His anemia is likely multifactorial secondary to IDA and chronic disease due to underlying comorbidities. If anemia doesn't improve in 1-2 months time after iron deficiency is corrected, I will consider bone marrow biopsy given significant anemia  2. Intra abdominal adenopathy- Scans were reviewed at tumor board and overall his adenopathy seems stable as compared to scans in 2016. I will repeat CT abdomen 3 months from now  3.  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 Will obtain those records from dr. Con Memos  4. MGUS- I do not think this is contributing to his anemia. Will repeat labs in 2 months  Visit Diagnosis 1. Iron deficiency anemia, unspecified iron deficiency anemia type   2. Adenopathy   3. MGUS (monoclonal gammopathy of unknown  significance)      Dr. Randa Evens, MD, MPH [facility] Pager- [phone] 10/25/2016 2:12 PM

## 2016-10-25 NOTE — Progress Notes (Signed)
Patient here for follow up he states there are no changes since last appoinmt=ent.

## 2016-10-29 ENCOUNTER — Encounter: Payer: Medicare HMO | Admitting: Surgery

## 2016-10-29 DIAGNOSIS — E11621 Type 2 diabetes mellitus with foot ulcer: Secondary | ICD-10-CM | POA: Diagnosis not present

## 2016-10-30 ENCOUNTER — Other Ambulatory Visit: Payer: Self-pay

## 2016-10-30 ENCOUNTER — Encounter: Payer: Self-pay | Admitting: Gastroenterology

## 2016-10-30 ENCOUNTER — Ambulatory Visit (INDEPENDENT_AMBULATORY_CARE_PROVIDER_SITE_OTHER): Payer: Medicare HMO | Admitting: Gastroenterology

## 2016-10-30 VITALS — BP 145/79 | HR 64 | Temp 98.2°F | Ht 69.5 in | Wt 161.2 lb

## 2016-10-30 DIAGNOSIS — D509 Iron deficiency anemia, unspecified: Secondary | ICD-10-CM

## 2016-10-30 DIAGNOSIS — B182 Chronic viral hepatitis C: Secondary | ICD-10-CM | POA: Diagnosis not present

## 2016-10-30 DIAGNOSIS — K7030 Alcoholic cirrhosis of liver without ascites: Secondary | ICD-10-CM

## 2016-10-30 NOTE — Progress Notes (Signed)
[name], [name] ([id number]) Visit Report for 10/29/2016 Chief Complaint Document Details Patient Name: [name], [name] 10/29/2016 8:00 Date of Service: AM Medical Record [id number] Number: Patient Account Number: [id number] Date of Birth/Sex: 24-Mar-1951 (66 y.o. Male) Treating RN: Anastasio Champion, Other Clinician: Primary Care Provider: Olivia Mackie Treating Zoye Chandra, Cherie Ouch, Provider/Extender: Referring Provider: Harless Nakayama in Treatment: 5 Information Obtained from: Patient Chief Complaint Patients presents for treatment of an open diabetic ulcer to both feet Electronic Signature(s) Signed: 10/29/2016 8:29:16 AM By: Christin Fudge MD, FACS Entered By: Christin Fudge on 10/29/2016 08:29:16 Sherfield, [name] ([id number]) -------------------------------------------------------------------------------- Debridement Details Patient Name: [name] Brittle E. 10/29/2016 8:00 Date of Service: AM Medical Record [id number] Number: Patient Account Number: [id number] Date of Birth/Sex: 1950-09-23 (66 y.o. Male) Treating RN: Anastasio Champion, Other Clinician: Primary Care Provider: Olivia Mackie Treating Uchenna Seufert, Cherie Ouch, Provider/Extender: Referring Provider: Harless Nakayama in Treatment: 5 Debridement Performed for Wound #1 Right Toe Great Assessment: Performed By: Physician Christin Fudge, MD Debridement: Debridement Severity of Tissue Pre Fat layer exposed Debridement: Pre-procedure Verification/Time Out Yes - 08:20 Taken: Start Time: 08:21 Pain Control: Lidocaine 4% Topical Solution Level: Skin/Subcutaneous Tissue Total Area Debrided (L x 2.5 (cm) x 2.5 (cm) = 6.25 (cm) W): Tissue and other Viable, Non-Viable, Exudate, Fibrin/Slough, Subcutaneous material debrided: Instrument: Curette Bleeding: Minimum Hemostasis Achieved: Pressure End Time: 08:22 Procedural Pain: 0 Post Procedural Pain: 0 Response to Treatment: Procedure was  tolerated well Post Debridement Measurements of Total Wound Length: (cm) 2.5 Width: (cm) 2.5 Depth: (cm) 0.1 Volume: (cm) 0.491 Character of Wound/Ulcer Post Requires Further Debridement Debridement: Severity of Tissue Post Debridement: Fat layer exposed Post Procedure Diagnosis Same as Pre-procedure INGVALD, THEISEN ([id number]) Electronic Signature(s) Signed: 10/29/2016 8:28:53 AM By: Christin Fudge MD, FACS Signed: 10/29/2016 4:27:45 PM By: Alric Quan Entered By: Christin Fudge on 10/29/2016 08:28:53 Lahm, [name] ([id number]) -------------------------------------------------------------------------------- Debridement Details Patient Name: [name] Brittle E. 10/29/2016 8:00 Date of Service: AM Medical Record [id number] Number: Patient Account Number: [id number] Date of Birth/Sex: [birth year] (66 y.o. Male) Treating RN: Anastasio Champion, Other Clinician: Primary Care Provider: Olivia Mackie Treating Shamanda Len, Cherie Ouch, Provider/Extender: Referring Provider: Harless Nakayama in Treatment: 5 Debridement Performed for Wound #2 Right Toe Second Assessment: Performed By: Physician Christin Fudge, MD Debridement: Debridement Severity of Tissue Pre Fat layer exposed Debridement: Pre-procedure Verification/Time Out Yes - 08:20 Taken: Start Time: 08:22 Pain Control: Lidocaine 4% Topical Solution Level: Skin/Subcutaneous Tissue Total Area Debrided (L x 1.5 (cm) x 1.9 (cm) = 2.85 (cm) W): Tissue and other Viable, Non-Viable, Exudate, Fibrin/Slough, Subcutaneous material debrided: Instrument: Curette Bleeding: Minimum Hemostasis Achieved: Pressure End Time: 08:23 Procedural Pain: 0 Post Procedural Pain: 0 Response to Treatment: Procedure was tolerated well Post Debridement Measurements of Total Wound Length: (cm) 1.5 Width: (cm) 1.9 Depth: (cm) 0.1 Volume: (cm) 0.224 Character of Wound/Ulcer Post Requires Further  Debridement Debridement: Severity of Tissue Post Debridement: Fat layer exposed Post Procedure Diagnosis Same as Pre-procedure Dukeman, [name] ([id number]) Electronic Signature(s) Signed: 10/29/2016 8:29:00 AM By: Christin Fudge MD, FACS Signed: 10/29/2016 4:27:45 PM By: Alric Quan Entered By: Christin Fudge on 10/29/2016 08:29:00 Mallette, [name] ([id number]) -------------------------------------------------------------------------------- Debridement Details Patient Name: [name] Brittle E. 10/29/2016 8:00 Date of Service: AM Medical Record [id number] Number: Patient Account Number: [id number] Date of Birth/Sex: 15-Nov-1950 (66 y.o. Male) Treating RN: Anastasio Champion, Other Clinician: Primary Care Provider: Olivia Mackie Treating Haruto Demaria, Cherie Ouch, Provider/Extender: Referring Provider: Harless Nakayama in Treatment: 5 Debridement Performed for Wound #6 Left Toe Second Assessment: Performed By:  Physician Christin Fudge, MD Debridement: Debridement Severity of Tissue Pre Fat layer exposed Debridement: Pre-procedure Verification/Time Out Yes - 08:20 Taken: Start Time: 08:24 Pain Control: Lidocaine 4% Topical Solution Level: Skin/Subcutaneous Tissue Total Area Debrided (L x 0.8 (cm) x 0.9 (cm) = 0.72 (cm) W): Tissue and other Viable, Non-Viable, Exudate, Fibrin/Slough, Subcutaneous material debrided: Instrument: Curette Bleeding: Minimum Hemostasis Achieved: Pressure End Time: 08:25 Procedural Pain: 0 Post Procedural Pain: 0 Response to Treatment: Procedure was tolerated well Post Debridement Measurements of Total Wound Length: (cm) 0.8 Width: (cm) 0.9 Depth: (cm) 0.1 Volume: (cm) 0.057 Character of Wound/Ulcer Post Requires Further Debridement Debridement: Severity of Tissue Post Debridement: Fat layer exposed Post Procedure Diagnosis Same as Pre-procedure [name], [name] ([id number]) Electronic Signature(s) Signed: 10/29/2016 8:29:09 AM By:  Christin Fudge MD, FACS Signed: 10/29/2016 4:27:45 PM By: Alric Quan Entered By: Christin Fudge on 10/29/2016 08:29:08 Rodriges, [name] ([id number]) -------------------------------------------------------------------------------- HPI Details Patient Name: [name], [name] 10/29/2016 8:00 Date of Service: AM Medical Record [id number] Number: Patient Account Number: [id number] Date of Birth/Sex: 1950/12/19 (66 y.o. Male) Treating RN: Anastasio Champion, Other Clinician: Primary Care Provider: Olivia Mackie Treating Temesgen Weightman, Cherie Ouch, Provider/Extender: Referring Provider: Harless Nakayama in Treatment: 5 History of Present Illness Location: bilateral feet ulceration on the toes Quality: Patient reports experiencing a dull pain to affected area(s). Severity: Patient states wound are getting better Duration: Patient has had the wound for > 3 months prior to seeking treatment at the wound center Timing: Pain in wound is constant (hurts all the time) Context: The wound would happen gradually Modifying Factors: Other treatment(s) tried include:treatment for lymphedema and is seen by the podiatrist Dr. Caryl Comes Associated Signs and Symptoms: Patient reports having increase swelling. HPI Description: 66 year old patient here to see as for bilateral feet ulceration to on his left first and second toe and 2 on his right first and second toe, which she's had for about 4 months. 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 Donella Stade. The patient is also undergoing workup by medical oncology for a intra-abdominal lymphadenopathy. 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 He is also status post appendectomy, inguinal lymph node biopsy, rectal  biopsy and rectal examination under anesthesia. he currently smokes cigarettes about half packet a day. In March of this year he was seen by Dr. Hortencia Pilar, for evaluation of bilateral varicose veins and besides wearing compression stockings he had recommended laser ablation of the right and left great saphenous veins to eleviate the symptoms and complications of severe superficial venous reflux disease. He also recommended lymphedema pumps for better control of his lymphedema. The patient recently has had on 08/23/2016, right greater saphenous vein ablation with the laser energy Earlier lower extremity venous reflux examination done on 05/08/2016 showed no DVT or SVT both lower legs but incompetence of bilateral great saphenous veins was present. A lower arterial study was also done and there was no significant right lower and left lower extremity problems based on a normal toe brachial index bilaterally and the ABI was 1.221 the left and 1.23 on the right. His post ablation venous duplex examination showed successful ablation of the right GS vein with thrombus formation 2 below the right saphenofemoral junction. The deep system was patent without evidence of thrombosis and this  was done on 08/30/2016. 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 Addendum: regarding his x-rays done today and x-ray of the left foot -- IMPRESSION: No objective Belt, Jaishaun E. ([id number]) evidence of osteomyelitis. There are soft tissue changes which may reflect cellulitis. X-ray of the right foot -- IMPRESSION:Findings compatible with cellulitis of the toes. No objective evidence of osteomyelitis is observed. 10/01/16 on evaluation today patient's wounds appeared to be doing some better. I did review the x-rays as well which showed no evidence of osteomyelitis although there was evidence on x-ray of  cellulitis. He fortunately is not having any discomfort although he continues to have some swelling. He does not remember being on any antibiotics recently. 10/15/16 on evaluation today patient's wounds overall appear to be doing better although he does have a new location noted on the left foot. Fortunately he is not having significant pain. It almost has the appearance that something is rubbing on the end of his toes but he wears the open toe shoes and according to what he is telling me never wears anything that would rub on his foot. There is no evidence of infection and specifically no evidence of a fungal infection 10/22/16 On evaluation today patient's wounds appeared to be doing better compared to last week in regard to his bilateral lower extremities. Fortunately I happy with how things are progressing although he still has ulcers I feel like that he is improving and appropriate manner. Electronic Signature(s) Signed: 10/29/2016 8:29:54 AM By: Christin Fudge MD, FACS Entered By: Christin Fudge on 10/29/2016 08:29:54 Bozman, JESSIAH STEINHART ([id number]) -------------------------------------------------------------------------------- Physical Exam Details Patient Name: [name], [name] 10/29/2016 8:00 Date of Service: AM Medical Record [id number] Number: Patient Account Number: [id number] Date of Birth/Sex: [birth year] (66 y.o. Male) Treating RN: Anastasio Champion, Other Clinician: Primary Care Provider: Olivia Mackie Treating Welcome Fults, Cherie Ouch, Provider/Extender: Referring Provider: Harless Nakayama in Treatment: 5 Constitutional . Pulse regular. Respirations normal and unlabored. Afebrile. . Eyes Nonicteric. Reactive to light. Ears, Nose, Mouth, and Throat Lips, teeth, and gums WNL.Marland [name] Moist mucosa without lesions. Neck supple and nontender. No palpable supraclavicular or cervical adenopathy. Normal sized without goiter. Respiratory WNL. No retractions.. Breath sounds WNL, No  rubs, rales, rhonchi, or wheeze.. Cardiovascular Heart rhythm and rate regular, no murmur or gallop.. Pedal Pulses WNL. No clubbing, cyanosis or edema. Chest Breasts symmetical and no nipple discharge.. Breast tissue WNL, no masses, lumps, or tenderness.. Lymphatic No adneopathy. No adenopathy. No adenopathy. Musculoskeletal Adexa without tenderness or enlargement.. Digits and nails w/o clubbing, cyanosis, infection, petechiae, ischemia, or inflammatory conditions.. Integumentary (Hair, Skin) No suspicious lesions. No crepitus or fluctuance. No peri-wound warmth or erythema. No masses.Marland [name] Psychiatric Judgement and insight Intact.. No evidence of depression, anxiety, or agitation.. Notes there is a lot of maceration around his wounds and his hygiene being poor I believe he's developed a fungal infection in the webspaces and interdigital areas both feet. Necrotic debris was removed with a #3 curet and there was no bleeding. Electronic Signature(s) Signed: 10/29/2016 8:30:32 AM By: Christin Fudge MD, FACS Entered By: Christin Fudge on 10/29/2016 08:30:31 Phimmasone, MASSON NALEPA ([id number]) ADYEN, BIFULCO ([id number]) -------------------------------------------------------------------------------- Physician Orders Details Patient Name: [name], [name] 10/29/2016 8:00 Date of Service: AM Medical Record [id number] Number: Patient Account Number: [id number] Date of Birth/Sex: 01/20/1951 (66 y.o. Male) Treating RN: Anastasio Champion, Other Clinician: Primary Care Provider: Olivia Mackie Treating Florenda Watt, Roderick Pee  MCLEAN-SCOCOZZA, Provider/Extender: Referring Provider: Harless Nakayama in Treatment: 5 Verbal / Phone Orders: Yes Clinician: Carolyne Fiscal, Debi Read Back and Verified: Yes Diagnosis Coding Wound Cleansing Wound #1 Right Toe Great o Cleanse wound with mild soap and water o May Shower, gently pat wound dry prior to applying new dressing. Wound #2 Right Toe Second o Cleanse wound  with mild soap and water o May Shower, gently pat wound dry prior to applying new dressing. Wound #3 Right Toe Third o Cleanse wound with mild soap and water o May Shower, gently pat wound dry prior to applying new dressing. Wound #4 Right,Dorsal Foot o Cleanse wound with mild soap and water o May Shower, gently pat wound dry prior to applying new dressing. Wound #5 Left Toe Great o Cleanse wound with mild soap and water o May Shower, gently pat wound dry prior to applying new dressing. Wound #6 Left Toe Second o Cleanse wound with mild soap and water o May Shower, gently pat wound dry prior to applying new dressing. Wound #7 Left Toe Third o Cleanse wound with mild soap and water o May Shower, gently pat wound dry prior to applying new dressing. Anesthetic Wound #1 Right Toe Great o Topical Lidocaine 4% cream applied to wound bed prior to debridement Wound #2 Right Toe Second o Topical Lidocaine 4% cream applied to wound bed prior to debridement Emry, Damoney E. ([id number]) Wound #3 Right Toe Third o Topical Lidocaine 4% cream applied to wound bed prior to debridement Wound #4 Right,Dorsal Foot o Topical Lidocaine 4% cream applied to wound bed prior to debridement Wound #5 Left Toe Great o Topical Lidocaine 4% cream applied to wound bed prior to debridement Wound #6 Left Toe Second o Topical Lidocaine 4% cream applied to wound bed prior to debridement Wound #7 Left Toe Third o Topical Lidocaine 4% cream applied to wound bed prior to debridement Skin Barriers/Peri-Wound Care Wound #1 Right Toe Great o Antifungal cream - on and between toes then AqAg over areas Wound #2 Right Toe Second o Antifungal cream - on and between toes then AqAg over areas Wound #3 Right Toe Third o Antifungal cream - on and between toes then AqAg over areas Wound #4 Right,Dorsal Foot o Antifungal cream - on and between toes then AqAg over areas Wound #5 Left  Toe Great o Antifungal cream - on and between toes then AqAg over areas Wound #6 Left Toe Second o Antifungal cream - on and between toes then AqAg over areas Wound #7 Left Toe Third o Antifungal cream - on and between toes then AqAg over areas Primary Wound Dressing Wound #1 Right Toe Great o Aquacel Ag Wound #2 Right Toe Second o Aquacel Ag Wound #3 Right Toe Third o Aquacel Ag Wound #4 Right,Dorsal Foot Dulak, Emannuel E. ([id number]) o Aquacel Ag Wound #5 Left Toe Great o Aquacel Ag Wound #6 Left Toe Second o Aquacel Ag Wound #7 Left Toe Third o Aquacel Ag Secondary Dressing Wound #1 Right Toe Great o ABD pad o Gauze and Kerlix/Conform Wound #2 Right Toe Second o ABD pad o Gauze and Kerlix/Conform Wound #3 Right Toe Third o ABD pad o Gauze and Kerlix/Conform Wound #4 Right,Dorsal Foot o ABD pad o Gauze and Kerlix/Conform Wound #5 Left Toe Great o ABD pad o Gauze and Kerlix/Conform Wound #6 Left Toe Second o ABD pad o Gauze and Kerlix/Conform Wound #7 Left Toe Third o ABD pad o Gauze and Kerlix/Conform Dressing Change Frequency Wound #1 Right Toe  Great o Change dressing every day. Wound #2 Right Toe Second o Change dressing every day. Wound #3 Right Toe Third Frazzini, Aldo E. ([id number]) o Change dressing every day. Wound #4 Right,Dorsal Foot o Change dressing every day. Wound #5 Left Toe Great o Change dressing every day. Wound #6 Left Toe Second o Change dressing every day. Wound #7 Left Toe Third o Change dressing every day. Follow-up Appointments Wound #1 Right Toe Great o Return Appointment in 1 week. Wound #2 Right Toe Second o Return Appointment in 1 week. Wound #3 Right Toe Third o Return Appointment in 1 week. Wound #4 Right,Dorsal Foot o Return Appointment in 1 week. Wound #5 Left Toe Great o Return Appointment in 1 week. Wound #6 Left Toe Second o Return  Appointment in 1 week. Wound #7 Left Toe Third o Return Appointment in 1 week. Edema Control Wound #1 Right Toe Great o Patient to wear own compression stockings o Elevate legs to the level of the heart and pump ankles as often as possible Wound #2 Right Toe Second o Patient to wear own compression stockings o Elevate legs to the level of the heart and pump ankles as often as possible Wound #3 Right Toe Third o Patient to wear own compression stockings o Elevate legs to the level of the heart and pump ankles as often as possible Fromer, Aswad E. ([id number]) Wound #4 Right,Dorsal Foot o Patient to wear own compression stockings o Elevate legs to the level of the heart and pump ankles as often as possible Wound #5 Left Toe Great o Patient to wear own compression stockings o Elevate legs to the level of the heart and pump ankles as often as possible Wound #6 Left Toe Second o Patient to wear own compression stockings o Elevate legs to the level of the heart and pump ankles as often as possible Wound #7 Left Toe Third o Patient to wear own compression stockings o Elevate legs to the level of the heart and pump ankles as often as possible Additional Orders / Instructions Wound #1 Right Toe Great o Stop Smoking - Please continue to stay away from alcohol o Other: - Please add vitamin A, vitamin C, and Zinc supplements to your diet Wound #2 Right Toe Second o Stop Smoking - Please continue to stay away from alcohol o Other: - Please add vitamin A, vitamin C, and Zinc supplements to your diet Wound #3 Right Toe Third o Stop Smoking - Please continue to stay away from alcohol o Other: - Please add vitamin A, vitamin C, and Zinc supplements to your diet Wound #4 Right,Dorsal Foot o Stop Smoking - Please continue to stay away from alcohol o Other: - Please add vitamin A, vitamin C, and Zinc supplements to your diet Wound #5 Left Toe Great o  Stop Smoking - Please continue to stay away from alcohol o Other: - Please add vitamin A, vitamin C, and Zinc supplements to your diet Wound #6 Left Toe Second o Stop Smoking - Please continue to stay away from alcohol o Other: - Please add vitamin A, vitamin C, and Zinc supplements to your diet Wound #7 Left Toe Third o Stop Smoking - Please continue to stay away from alcohol o Other: - Please add vitamin A, vitamin C, and Zinc supplements to your diet Patient Medications Allergies: No Known Drug Allergies Schum, Lelynd E. ([id number]) Notifications Medication Indication Start End clotrimazole-betamethasone 10/29/2016 DOSE topical 1 %-0.05 % cream - cream topical as directed Electronic  Signature(s) Signed: 10/29/2016 4:15:42 PM By: Christin Fudge MD, FACS Signed: 10/29/2016 4:27:45 PM By: Alric Quan Previous Signature: 10/29/2016 8:28:12 AM Version By: Christin Fudge MD, FACS Entered By: Alric Quan on 10/29/2016 08:38:10 Wilbourne, CALEM COCOZZA ([id number]) -------------------------------------------------------------------------------- Problem List Details Patient Name: [name], [name] 10/29/2016 8:00 Date of Service: AM Medical Record [id number] Number: Patient Account Number: [id number] Date of Birth/Sex: Jun 08, 1950 (66 y.o. Male) Treating RN: Anastasio Champion, Other Clinician: Primary Care Provider: Olivia Mackie Treating Eduar Kumpf, Cherie Ouch, Provider/Extender: Referring Provider: Harless Nakayama in Treatment: 5 Active Problems ICD-10 Encounter Code Description Active Date Diagnosis E11.621 Type 2 diabetes mellitus with foot ulcer 09/21/2016 Yes I87.313 Chronic venous hypertension (idiopathic) with ulcer of 09/21/2016 Yes bilateral lower extremity I89.0 Lymphedema, not elsewhere classified 09/21/2016 Yes L97.522 Non-pressure chronic ulcer of other part of left foot with fat 09/21/2016 Yes layer exposed L97.512 Non-pressure chronic ulcer of other part  of right foot with 09/21/2016 Yes fat layer exposed F17.218 Nicotine dependence, cigarettes, 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 10/29/2016 8:00 Date of Service: AM Medical Record [id number] Number: Patient Account Number: [id number] Date of Birth/Sex: 01-25-1951 (66 y.o. Male) Treating RN: Anastasio Champion, Other Clinician: Primary Care Provider: Olivia Mackie Treating Stephon Weathers, Cherie Ouch, Provider/Extender: Referring Provider: Harless Nakayama in Treatment: 5 Subjective Chief Complaint Information obtained from Patient Patients presents for treatment of an open diabetic ulcer to both feet History of Present Illness (HPI) The following HPI elements were documented for the patient's wound: Location: bilateral feet ulceration on the toes Quality: Patient reports experiencing a dull pain to affected area(s). Severity: Patient states wound are getting better Duration: Patient has had the wound for > 3 months prior to seeking treatment at the wound center Timing: Pain in wound is constant (hurts all the time) Context: The wound would happen gradually Modifying Factors: Other treatment(s) tried include:treatment for lymphedema and is seen by the podiatrist Dr. Caryl Comes Associated Signs and Symptoms: Patient reports having increase swelling. 66 year old patient here to see as for bilateral feet ulceration to on his left first and second toe and 2 on his right first and second toe, which she's had for about 4 months. 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 Donella Stade. The patient is also undergoing workup by medical oncology for a intra- abdominal lymphadenopathy. 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 He is also status post appendectomy, inguinal lymph node biopsy, rectal biopsy and rectal examination under anesthesia. he currently smokes cigarettes about half packet a day. In March of this year he was seen by Dr. Hortencia Pilar, for evaluation of bilateral varicose veins and besides wearing compression stockings he had recommended laser ablation of the right and left great saphenous veins to eleviate the symptoms and complications of severe superficial venous reflux disease. He also recommended lymphedema pumps for better control of his lymphedema. The patient recently has had on 08/23/2016, right greater saphenous vein ablation with the laser energy Earlier lower extremity venous reflux examination done on 05/08/2016 showed  no DVT or SVT both lower legs but incompetence of bilateral great saphenous veins was present. A lower arterial study was also done and there was no significant right lower and left lower extremity problems based on a normal toe brachial index bilaterally and the ABI was 1.221 the left and 1.23 on the Pell, Damontae E. ([id number]) right. His post ablation venous duplex examination showed successful ablation of the right GS vein with thrombus formation 2 below the right saphenofemoral junction. The deep system was patent without evidence of thrombosis and this was done on 08/30/2016. 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 Addendum:  regarding his x-rays done today and x-ray of the left foot -- IMPRESSION: No objective evidence of osteomyelitis. There are soft tissue changes which may reflect cellulitis. X-ray of the right foot -- IMPRESSION:Findings compatible with cellulitis of the toes. No objective evidence of osteomyelitis is observed. 10/01/16 on evaluation today patient's wounds appeared to be doing some better. I did review the x-rays as well which showed no evidence of osteomyelitis although there was evidence on x-ray of cellulitis. He fortunately is not having any discomfort although he continues to have some swelling. He does not remember being on any antibiotics recently. 10/15/16 on evaluation today patient's wounds overall appear to be doing better although he does have a new location noted on the left foot. Fortunately he is not having significant pain. It almost has the appearance that something is rubbing on the end of his toes but he wears the open toe shoes and according to what he is telling me never wears anything that would rub on his foot. There is no evidence of infection and specifically no evidence of a fungal infection 10/22/16 On evaluation today patient's wounds appeared to be doing better compared to last week in regard to his bilateral lower extremities. Fortunately I happy with how things are progressing although he still has ulcers I feel like that he is improving and appropriate manner. Objective Constitutional Pulse regular. Respirations normal and unlabored. Afebrile. Vitals Time Taken: 8:00 AM, Height: 69 in, Weight: 168 lbs, BMI: 24.8, Temperature: 98.2 F, Pulse: 69 bpm, Respiratory Rate: 16 breaths/min, Blood Pressure: 137/69 mmHg. Eyes Nonicteric. Reactive to light. Ears, Nose, Mouth, and Throat Lips, teeth, and gums WNL.Marland [name] Moist mucosa without lesions. Neck Manders, Alfonzo E. ([id number]) supple and nontender. No palpable supraclavicular or cervical adenopathy. Normal sized without  goiter. Respiratory WNL. No retractions.. Breath sounds WNL, No rubs, rales, rhonchi, or wheeze.. Cardiovascular Heart rhythm and rate regular, no murmur or gallop.. Pedal Pulses WNL. No clubbing, cyanosis or edema. Chest Breasts symmetical and no nipple discharge.. Breast tissue WNL, no masses, lumps, or tenderness.. Lymphatic No adneopathy. No adenopathy. No adenopathy. Musculoskeletal Adexa without tenderness or enlargement.. Digits and nails w/o clubbing, cyanosis, infection, petechiae, ischemia, or inflammatory conditions.Marland [name] Psychiatric Judgement and insight Intact.. No evidence of depression, anxiety, or agitation.. General Notes: there is a lot of maceration around his wounds and his hygiene being poor I believe he's developed a fungal infection in the webspaces and interdigital areas both feet. Necrotic debris was removed with a #3 curet and there was no bleeding. Integumentary (Hair, Skin) No suspicious lesions. No crepitus or fluctuance. No peri-wound warmth or erythema. No masses.. Wound #1 status is Open. Original cause of wound was Gradually Appeared. The wound is located on the Right Toe Great. The wound measures 2.5cm  length x 2.5cm width x 0.1cm depth; 4.909cm^2 area and 0.491cm^3 volume. There is Fat Layer (Subcutaneous Tissue) Exposed exposed. There is no tunneling noted. There is a large amount of serosanguineous drainage noted. The wound margin is flat and intact. There is large (67-100%) pink granulation within the wound bed. There is a small (1-33%) amount of necrotic tissue within the wound bed including Adherent Slough. The periwound skin appearance exhibited: Excoriation, Maceration. The periwound skin appearance did not exhibit: Callus, Crepitus, Induration, Rash, Scarring, Dry/Scaly, Atrophie Blanche, Cyanosis, Ecchymosis, Hemosiderin Staining, Mottled, Pallor, Rubor, Erythema. Periwound temperature was noted as No Abnormality. Wound #2 status is Open. Original  cause of wound was Gradually Appeared. The wound is located on the Right Toe Second. The wound measures 1.5cm length x 1.9cm width x 0.1cm depth; 2.238cm^2 area and 0.224cm^3 volume. There is Fat Layer (Subcutaneous Tissue) Exposed exposed. There is no tunneling or undermining noted. There is a large amount of serosanguineous drainage noted. The wound margin is flat and intact. There is medium (34-66%) pink granulation within the wound bed. There is a medium (34-66%) amount of necrotic tissue within the wound bed including Adherent Slough. The periwound skin appearance exhibited: Excoriation, Maceration. The periwound skin appearance did not exhibit: Callus, Crepitus, Induration, Rash, Scarring, Dry/Scaly, Atrophie Blanche, Cyanosis, Ecchymosis, Hemosiderin Staining, Mottled, Pallor, Rubor, Erythema. Periwound temperature was noted as No Abnormality. Wound #3 status is Open. Original cause of wound was Gradually Appeared. The wound is located on the Right Toe Third. The wound measures 0.1cm length x 0.1cm width x 0.1cm depth; 0.008cm^2 area and Distler, Faiz E. ([id number]) 0.001cm^3 volume. There is Fat Layer (Subcutaneous Tissue) Exposed exposed. There is no tunneling or undermining noted. There is a large amount of serous drainage noted. The wound margin is flat and intact. There is large (67-100%) pink granulation within the wound bed. There is a small (1-33%) amount of necrotic tissue within the wound bed including Adherent Slough. The periwound skin appearance exhibited: Excoriation. The periwound skin appearance did not exhibit: Callus, Crepitus, Induration, Rash, Scarring, Dry/Scaly, Maceration, Atrophie Blanche, Cyanosis, Ecchymosis, Hemosiderin Staining, Mottled, Pallor, Rubor, Erythema. Periwound temperature was noted as No Abnormality. Wound #4 status is Open. Original cause of wound was Gradually Appeared. The wound is located on the Right,Dorsal Foot. The wound measures 3.2cm length x  3cm width x 0.1cm depth; 7.54cm^2 area and 0.754cm^3 volume. There is no tunneling noted. There is a large amount of serous drainage noted. The wound margin is distinct with the outline attached to the wound base. There is no granulation within the wound bed. There is a large (67-100%) amount of necrotic tissue within the wound bed including Adherent Slough. The periwound skin appearance exhibited: Maceration. Periwound temperature was noted as No Abnormality. Wound #5 status is Open. Original cause of wound was Gradually Appeared. The wound is located on the Left Toe Great. The wound measures 0.1cm length x 0.1cm width x 0.1cm depth; 0.008cm^2 area and 0.001cm^3 volume. There is Fat Layer (Subcutaneous Tissue) Exposed exposed. There is no tunneling or undermining noted. There is a large amount of serous drainage noted. The wound margin is flat and intact. There is large (67-100%) pink granulation within the wound bed. There is a small (1-33%) amount of necrotic tissue within the wound bed including Adherent Slough. The periwound skin appearance exhibited: Excoriation. The periwound skin appearance did not exhibit: Callus, Crepitus, Induration, Rash, Scarring, Dry/Scaly, Maceration, Atrophie Blanche, Cyanosis, Ecchymosis, Hemosiderin Staining, Mottled, Pallor, Rubor, Erythema. Periwound temperature was noted  as No Abnormality. Wound #6 status is Open. Original cause of wound was Gradually Appeared. The wound is located on the Left Toe Second. The wound measures 0.8cm length x 0.9cm width x 0.1cm depth; 0.565cm^2 area and 0.057cm^3 volume. There is Fat Layer (Subcutaneous Tissue) Exposed exposed. There is no tunneling or undermining noted. There is a large amount of serous drainage noted. The wound margin is flat and intact. There is large (67-100%) pink granulation within the wound bed. There is a small (1-33%) amount of necrotic tissue within the wound bed including Adherent Slough. The  periwound skin appearance exhibited: Excoriation, Maceration. The periwound skin appearance did not exhibit: Callus, Crepitus, Induration, Rash, Scarring, Dry/Scaly, Atrophie Blanche, Cyanosis, Ecchymosis, Hemosiderin Staining, Mottled, Pallor, Rubor, Erythema. Periwound temperature was noted as No Abnormality. Wound #7 status is Open. Original cause of wound was Gradually Appeared. The wound is located on the Left Toe Third. The wound measures 0.5cm length x 0.3cm width x 0.1cm depth; 0.118cm^2 area and 0.012cm^3 volume. There is Fat Layer (Subcutaneous Tissue) Exposed exposed. There is no tunneling or undermining noted. There is a large amount of serous drainage noted. The wound margin is flat and intact. There is large (67-100%) red granulation within the wound bed. There is a small (1-33%) amount of necrotic tissue within the wound bed including Adherent Slough. The periwound skin appearance exhibited: Excoriation. The periwound skin appearance did not exhibit: Callus, Crepitus, Induration, Rash, Scarring, Dry/Scaly, Maceration, Atrophie Blanche, Cyanosis, Ecchymosis, Hemosiderin Staining, Mottled, Pallor, Rubor, Erythema. 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 There was  a Skin/Subcutaneous Tissue Debridement ([id number]) debridement with total area of 6.25 sq cm performed by Christin Fudge, MD. with the following instrument(s): Curette to remove Viable and Non-Viable tissue/material including Exudate, Fibrin/Slough, and Subcutaneous after achieving pain control using Lidocaine 4% Topical Solution. A time out was conducted at 08:20, prior to the start of the procedure. A Minimum amount of bleeding was controlled with Pressure. The procedure was tolerated well with a pain level of 0 throughout and a pain level of 0 following the procedure. Post Debridement Measurements: 2.5cm length x 2.5cm width x 0.1cm depth; 0.491cm^3 volume. Character of Wound/Ulcer Post Debridement requires further debridement. Severity of Tissue Post Debridement is: Fat layer exposed. Post procedure Diagnosis Wound #1: Same as Pre-Procedure Wound #2 Pre-procedure diagnosis of Wound #2 is a Diabetic Wound/Ulcer of the Lower Extremity located on the Right Toe Second .Severity of Tissue Pre Debridement is: Fat layer exposed. There was a Skin/Subcutaneous Tissue Debridement ([id number]) debridement with total area of 2.85 sq cm performed by Christin Fudge, MD. with the following instrument(s): Curette to remove Viable and Non-Viable tissue/material including Exudate, Fibrin/Slough, and Subcutaneous after achieving pain control using Lidocaine 4% Topical Solution. A time out was conducted at 08:20, prior to the start of the procedure. A Minimum amount of bleeding was controlled with Pressure. The procedure  was tolerated well with a pain level of 0 throughout and a pain level of 0 following the procedure. Post Debridement Measurements: 1.5cm length x 1.9cm width x 0.1cm depth; 0.224cm^3 volume. Character of Wound/Ulcer Post Debridement requires further debridement. Severity of Tissue Post Debridement is: Fat layer exposed. Post procedure Diagnosis Wound #2: Same as Pre-Procedure Rennaker, Milik  E. ([id number]) Wound #6 Pre-procedure diagnosis of Wound #6 is a Diabetic Wound/Ulcer of the Lower Extremity located on the Left Toe Second .Severity of Tissue Pre Debridement is: Fat layer exposed. There was a Skin/Subcutaneous Tissue Debridement ([id number]) debridement with total area of 0.72 sq cm performed by Christin Fudge, MD. with the following instrument(s): Curette to remove Viable and Non-Viable tissue/material including Exudate, Fibrin/Slough, and Subcutaneous after achieving pain control using Lidocaine 4% Topical Solution. A time out was conducted at 08:20, prior to the start of the procedure. A Minimum amount of bleeding was controlled with Pressure. The procedure was tolerated well with a pain level of 0 throughout and a pain level of 0 following the procedure. Post Debridement Measurements: 0.8cm length x 0.9cm width x 0.1cm depth; 0.057cm^3 volume. Character of Wound/Ulcer Post Debridement requires further debridement. Severity of Tissue Post Debridement is: Fat layer exposed. Post procedure Diagnosis Wound #6: Same as Pre-Procedure Plan Wound Cleansing: Wound #1 Right Toe Great: Cleanse wound with mild soap and water May Shower, gently pat wound dry prior to applying new dressing. Wound #2 Right Toe Second: Cleanse wound with mild soap and water May Shower, gently pat wound dry prior to applying new dressing. Wound #3 Right Toe Third: Cleanse wound with mild soap and water May Shower, gently pat wound dry prior to applying new dressing. Wound #4 Right,Dorsal Foot: Cleanse wound with mild soap and water May Shower, gently pat wound dry prior to applying new dressing. Wound #5 Left Toe Great: Cleanse wound with mild soap and water May Shower, gently pat wound dry prior to applying new dressing. Wound #6 Left Toe Second: Cleanse wound with mild soap and water May Shower, gently pat wound dry prior to applying new dressing. Wound #7 Left Toe Third: Cleanse wound with  mild soap and water May Shower, gently pat wound dry prior to applying new dressing. Anesthetic: Wound #1 Right Toe Great: Topical Lidocaine 4% cream applied to wound bed prior to debridement Wound #2 Right Toe Second: Topical Lidocaine 4% cream applied to wound bed prior to debridement Wound #3 Right Toe Third: Topical Lidocaine 4% cream applied to wound bed prior to debridement Currier, Janos E. ([id number]) Wound #4 Right,Dorsal Foot: Topical Lidocaine 4% cream applied to wound bed prior to debridement Wound #5 Left Toe Great: Topical Lidocaine 4% cream applied to wound bed prior to debridement Wound #6 Left Toe Second: Topical Lidocaine 4% cream applied to wound bed prior to debridement Wound #7 Left Toe Third: Topical Lidocaine 4% cream applied to wound bed prior to debridement Skin Barriers/Peri-Wound Care: Wound #1 Right Toe Great: Antifungal cream - on and between toes then AqAg over areas Wound #2 Right Toe Second: Antifungal cream - on and between toes then AqAg over areas Wound #3 Right Toe Third: Antifungal cream - on and between toes then AqAg over areas Wound #4 Right,Dorsal Foot: Antifungal cream - on and between toes then AqAg over areas Wound #5 Left Toe Great: Antifungal cream - on and between toes then AqAg over areas Wound #6 Left Toe Second: Antifungal cream - on and between toes then AqAg over areas Wound #7 Left  Toe Third: Antifungal cream - on and between toes then AqAg over areas Primary Wound Dressing: Wound #1 Right Toe Great: Aquacel Ag Wound #2 Right Toe Second: Aquacel Ag Wound #3 Right Toe Third: Aquacel Ag Wound #4 Right,Dorsal Foot: Aquacel Ag Wound #5 Left Toe Great: Aquacel Ag Wound #6 Left Toe Second: Aquacel Ag Wound #7 Left Toe Third: Aquacel Ag Secondary Dressing: Wound #1 Right Toe Great: ABD pad Gauze and Kerlix/Conform Wound #2 Right Toe Second: ABD pad Gauze and Kerlix/Conform Wound #3 Right Toe Third: ABD pad Gauze and  Kerlix/Conform Wound #4 Right,Dorsal Foot: ABD pad Gauze and Kerlix/Conform Chenevert, Pierson E. ([id number]) Wound #5 Left Toe Great: ABD pad Gauze and Kerlix/Conform Wound #6 Left Toe Second: ABD pad Gauze and Kerlix/Conform Wound #7 Left Toe Third: ABD pad Gauze and Kerlix/Conform Dressing Change Frequency: Wound #1 Right Toe Great: Change dressing every day. Wound #2 Right Toe Second: Change dressing every day. Wound #3 Right Toe Third: Change dressing every day. Wound #4 Right,Dorsal Foot: Change dressing every day. Wound #5 Left Toe Great: Change dressing every day. Wound #6 Left Toe Second: Change dressing every day. Wound #7 Left Toe Third: Change dressing every day. Follow-up Appointments: Wound #1 Right Toe Great: Return Appointment in 1 week. Wound #2 Right Toe Second: Return Appointment in 1 week. Wound #3 Right Toe Third: Return Appointment in 1 week. Wound #4 Right,Dorsal Foot: Return Appointment in 1 week. Wound #5 Left Toe Great: Return Appointment in 1 week. Wound #6 Left Toe Second: Return Appointment in 1 week. Wound #7 Left Toe Third: Return Appointment in 1 week. Edema Control: Wound #1 Right Toe Great: Patient to wear own compression stockings Elevate legs to the level of the heart and pump ankles as often as possible Wound #2 Right Toe Second: Patient to wear own compression stockings Elevate legs to the level of the heart and pump ankles as often as possible Wound #3 Right Toe Third: Patient to wear own compression stockings Elevate legs to the level of the heart and pump ankles as often as possible Wound #4 Right,Dorsal Foot: Patient to wear own compression stockings Thorman, Sujay E. ([id number]) Elevate legs to the level of the heart and pump ankles as often as possible Wound #5 Left Toe Great: Patient to wear own compression stockings Elevate legs to the level of the heart and pump ankles as often as possible Wound #6 Left Toe  Second: Patient to wear own compression stockings Elevate legs to the level of the heart and pump ankles as often as possible Wound #7 Left Toe Third: Patient to wear own compression stockings Elevate legs to the level of the heart and pump ankles as often as possible Additional Orders / Instructions: Wound #1 Right Toe Great: Stop Smoking - Please continue to stay away from alcohol Other: - Please add vitamin A, vitamin C, and Zinc supplements to your diet Wound #2 Right Toe Second: Stop Smoking - Please continue to stay away from alcohol Other: - Please add vitamin A, vitamin C, and Zinc supplements to your diet Wound #3 Right Toe Third: Stop Smoking - Please continue to stay away from alcohol Other: - Please add vitamin A, vitamin C, and Zinc supplements to your diet Wound #4 Right,Dorsal Foot: Stop Smoking - Please continue to stay away from alcohol Other: - Please add vitamin A, vitamin C, and Zinc supplements to your diet Wound #5 Left Toe Great: Stop Smoking - Please continue to stay away from alcohol Other: -  Please add vitamin A, vitamin C, and Zinc supplements to your diet Wound #6 Left Toe Second: Stop Smoking - Please continue to stay away from alcohol Other: - Please add vitamin A, vitamin C, and Zinc supplements to your diet Wound #7 Left Toe Third: Stop Smoking - Please continue to stay away from alcohol Other: - Please add vitamin A, vitamin C, and Zinc supplements to your diet The following medication(s) was prescribed: clotrimazole-betamethasone topical 1 %-0.05 % cream cream topical as directed starting 10/29/2016 After top debridement and review today I have recommended: 1. Silver alginate to be applied over his wounds, and change daily after washing with soap and water. 2. I have also prescribed Lotrisone ointment to be applied liberally to his toes and interdigital areas 3. Good control of his diabetes mellitus 4. the need to completely give up smoking discussed  with him in great detail 5. regular visits the wound center TREYCE, SPILLERS ([id number]) Electronic Signature(s) Signed: 10/29/2016 4:17:38 PM By: Christin Fudge MD, FACS Previous Signature: 10/29/2016 8:31:42 AM Version By: Christin Fudge MD, FACS Entered By: Christin Fudge on 10/29/2016 16:17:38 Malczewski, [name] ([id number]) -------------------------------------------------------------------------------- SuperBill Details Patient Name: Rzepka, Tamaj E. Date of Service: 10/29/2016 Medical Record Number: [id number] Patient Account Number: [id number] Date of Birth/Sex: 23-May-1950 (66 y.o. Male) Treating RN: Ahmed Prima Primary Care Provider: Myrtie Hawk Other Clinician: Referring Provider: Myrtie Hawk Treating Provider/Extender: Frann Rider in Treatment: 5 Diagnosis Coding ICD-10 Codes Code Description E11.621 Type 2 diabetes mellitus with foot ulcer I87.313 Chronic venous hypertension (idiopathic) with ulcer of bilateral lower extremity I89.0 Lymphedema, not elsewhere classified L97.522 Non-pressure chronic ulcer of other part of left foot with fat layer exposed L97.512 Non-pressure chronic ulcer of other part of right foot with fat layer exposed F17.218 Nicotine dependence, cigarettes, 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 abuse with unspecified alcohol-induced disorder Facility Procedures CPT4 Code Description: [id number] - DEB SUBQ TISSUE 20 SQ CM/< ICD-10 Description Diagnosis E11.621 Type 2 diabetes mellitus with foot ulcer L97.522 Non-pressure chronic ulcer of other part of left foot L97.512 Non-pressure chronic ulcer of other  part of right foo Modifier: with fat layer t with fat laye Quantity: 1 exposed r exposed Physician Procedures CPT4 Code Description: [id number] - WC PHYS SUBQ TISS 20 SQ CM ICD-10 Description Diagnosis E11.621 Type 2 diabetes mellitus with foot ulcer L97.522 Non-pressure chronic ulcer of other part of left  foot L97.512 Non-pressure chronic ulcer of other part  of right foo Modifier: with fat layer t with fat laye Quantity: 1 exposed r exposed Electronic Signature(s) Signed: 10/29/2016 8:31:59 AM By: Christin Fudge MD, FACS Entered By: Christin Fudge on 10/29/2016 08:31:59

## 2016-10-30 NOTE — Progress Notes (Signed)
Jonathon Bellows MD, MRCP(U.K) [address]  [place]  [place], [place] [address]  Main: [phone]  Fax: [phone]   Primary Care Physician: [place]-Scocuzza, Nino Glow, MD  Primary Gastroenterologist:  Dr. Jonathon Bellows   Chief Complaint  Patient presents with  . Follow-up    HPI: [name] is a 66 y.o. 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 CT abdomen in 05/2016 showed liver cirrhosis, ascites , bowel wall edema , small b/l pleural effusions , mild splenomegaly. Mild abdominal lymphadenopathy . CT abdomen in 08/2014 showed abdominal lymphadenopathy . Prior evaluation of the lymphadenopathy was negative for neoplasm.   Labs from 01/2016 shows a microcytic anemia with MCV of 66 and HB of 8.1 ,platelet count of 248. Ferritin was 13 in 01/2016 . CMP in 09/2015 shows albumin of 2.8 .  He used to  consume 2 pints of bourbon a week , for 15 years, stopped drinking 2 years back but since has been drinking on and off some beer. Denies any illegal drug use. Denies any blood transfusion.    Interval history   08/27/2016-  10/30/2016   Abdominal lymphadenopathy and iron deficiency anemia : Seen oncology 09/18/16 -Was evaluated for microcytic anemia , inguinal lymphadenopathy , subsequently received a blood transfusion , given IV iron. Plan per last oncology note is to repeat Ct abdomen in 3 months   Ascites : USG showed no further ascites on 10/01/16  .   Endoscopic evaluation : I performed on 10/04/16: EGD - stomach , duodenum were normal. I felt I noted a mass in the back of his throat and subsequently referred him to ENT , was evaluated -underwent laryngoscopy and no mass seen , possible that it was just the base of the tongue that I noted and he has a follow up in 3 months . I also performed a colonoscopy and found a small sessile polyp 6 mm in size at the appendix which I removed, I also did note that the appendix was  protruding and had some stool plugged in the orifice, no mucus was seen coming out. I referred him to [facility] to evaluate further with possible EUS to r/o a mucocele. He was seen at [facility] and the plan is to repeat his colonoscopy.   Labs 10/25/16- Ferritin 131 improved, Hb 8.0   Ascitic fluid 09/17/16- Lipase 12,protein < 3, albumin < 1.0 , no sbp , INR 0.98, Hep A ab - positive , Hbsag, Hbeab - negative.  HCV 6.464 iu/ml , HIV negative. Ceruloplasmin normal  Urine showed small Hb with WBC  Denies any confusion , no constipation. Having a bowel movement twice daily. Watching the salt in his diet.  Stopped smoking , no alcohol.    Current Outpatient Prescriptions  Medication Sig Dispense Refill  . aspirin EC 81 MG tablet Take 81 mg by mouth daily.    Marland Kitchen atorvastatin (LIPITOR) 40 MG tablet Take 40 mg by mouth at bedtime.    . chlorthalidone (HYGROTON) 25 MG tablet Take by mouth.    . doxycycline (VIBRA-TABS) 100 MG tablet Take by mouth.    . ferrous sulfate 325 (65 FE) MG tablet Take by mouth.    Marland Kitchen HYDROcodone-acetaminophen (NORCO) 5-325 MG tablet Take 1 tablet by mouth every 6 (six) hours as needed for moderate pain. 30 tablet 0  . hydrOXYzine (ATARAX/VISTARIL) 25 MG tablet Take 25 mg by mouth 3 times  daily as needed.    . isosorbide mononitrate (IMDUR) 30 MG 24 hr tablet Take 30 mg by mouth daily.     Marland Kitchen lisinopril (PRINIVIL,ZESTRIL) 40 MG tablet Take 40 mg by mouth once daily.    Marland Kitchen omeprazole (PRILOSEC) 40 MG capsule Take by mouth.    . polyethylene glycol (MIRALAX / GLYCOLAX) packet Take by mouth.    . silver sulfADIAZINE (SILVADENE) 1 % cream Apply 1 application topically 2 (two) times daily. 50 g 2  . spironolactone (ALDACTONE) 25 MG tablet Take 25 mg by mouth daily.     Marland Kitchen torsemide (DEMADEX) 20 MG tablet Take 20 mg by mouth daily.      Current Facility-Administered Medications  Medication Dose Route Frequency Provider Last Rate Last Dose  . albumin human 25 % solution 50 g  50 g  Intravenous Once Jonathon Bellows, MD      . albumin human 25 % solution 50 g  50 g Intravenous Once Jonathon Bellows, MD        Allergies as of 10/30/2016  . (No Known Allergies)    ROS:  General: Negative for anorexia, weight loss, fever, chills, fatigue, weakness. ENT: Negative for hoarseness, difficulty swallowing , nasal congestion. CV: Negative for chest pain, angina, palpitations, dyspnea on exertion, peripheral edema.  Respiratory: Negative for dyspnea at rest, dyspnea on exertion, cough, sputum, wheezing.  GI: See history of present illness. GU:  Negative for dysuria, hematuria, urinary incontinence, urinary frequency, nocturnal urination.  Endo: Negative for unusual weight change.    Physical Examination:   BP (!) 145/79 (BP Location: Right Arm, Patient Position: Sitting, Cuff Size: Normal)   Pulse 64   Temp 98.2 F (36.8 C) (Oral)   Ht 5' 9.5" (1.765 m)   Wt 161 lb 3.2 oz (73.1 kg)   BMI 23.46 kg/m   General: Well-nourished, well-developed in no acute distress.  Eyes: No icterus. Conjunctivae pink. Mouth: Oropharyngeal mucosa moist and pink , no lesions erythema or exudate. Lungs: Clear to auscultation bilaterally. Non-labored. Heart: Regular rate and rhythm, no murmurs rubs or gallops.  Abdomen: Bowel sounds are normal, nontender, nondistended, no hepatosplenomegaly or masses, no abdominal bruits or hernia , no rebound or guarding.   Extremities: No lower extremity edema. No clubbing or deformities. Neuro: Alert and oriented x 3.  Grossly intact. Skin: Warm and dry, no jaundice.   Psych: Alert and cooperative, normal mood and affect.   Imaging Studies: US Abdomen Limited  Result Date: 10/01/2016 CLINICAL DATA:  Ascites EXAM: LIMITED ABDOMEN ULTRASOUND FOR ASCITES TECHNIQUE: Limited ultrasound survey for ascites was performed in all four abdominal quadrants. COMPARISON:  None. FINDINGS: There is very minimal ascites in the abdomen, seen best in the right upper quadrant  adjacent liver. Paracentesis was not performed. IMPRESSION: Minimal ascites.  Paracentesis was not performed. Electronically Signed   By: Marybelle Killings M.D.   On: 10/01/2016 13:21    Assessment and Plan:   [name] is a 66 y.o. y/o male  here to follow up for hepatitis c related cirrhosis .H/o abdominal lymphadenopathy going on for a while , unclear of the etiology.   Active problems  1. Liver cirrhosis - likely secondary to alcohol/Hep C, no encephelopathy  , no varices , no ascites presently and no hepatic encephelopathy-compensated. 2. Iron deficiency anemia- unclear etiology- EGD+colonoscopy did not reveal a source for blood loss.  3. Abdominal lymphadenopathy- unclear etiology- follows with oncology  4. Ascites- none per last USG 09/2016  5. Smoker  6. H/o alcohol abuse.  7. Hepatitis C  Plan  1. Check AMA, f actin  2. Capsule study of the small bowel  4. Abdominal lymphadenopathy- follows with oncology - repeat CT abdomen in 3 months per oncology   5. Urine analysis 6. EGD to screen for esophageal varices in 3 years 7. Check Hep A/B vaccine status and immunize as needed 8. Stop al alcohol and smoking  9. RUQ USG in 11/2016 to screen for [place] 10. Hep C genotype. - will plan for Hepatitis C treatment after he has the capsule study. I will start the order process for the meds once I get his genotype.   Risks, benefits, alternatives of Givens capsule discussed with patient to include but not limited to the rare risk of Given's capsule becoming lodged in the GI tract requiring surgical removal.  The patient agrees with this plan & consent will be obtained.   Dr Jonathon Bellows  MD,MRCP [facility]) Follow up in 6 weeks

## 2016-10-30 NOTE — Progress Notes (Signed)
TERI, DILTZ ([id number]) Visit Report for 10/29/2016 Arrival Information Details Patient Name: [name], [name] 10/29/2016 8:00 Date of Service: AM Medical Record [id number] Number: Patient Account Number: [id number] Date of Birth/Sex: [birth year] (66 y.o. Male) Treating RN: Anastasio Champion, Other Clinician: Primary Care Sanna Porcaro: Olivia Mackie Treating Britto, Cherie Ouch, Dakotah [name]/Extender: Referring Annely Sliva: Harless Nakayama in Treatment: 5 Visit Information History Since Last Visit All ordered tests and consults were completed: No Patient Arrived: Ambulatory Added or deleted any medications: No Arrival Time: 07:57 Any new allergies or adverse reactions: No Accompanied By: brother in law Had a fall or experienced change in No activities of daily living that may affect Transfer Assistance: None risk of falls: Patient Identification Verified: Yes Signs or symptoms of abuse/neglect since last No Secondary Verification Process Yes visito Completed: Hospitalized since last visit: No Patient Requires Transmission-Based No Has Dressing in Place as Prescribed: Yes Precautions: Pain Present Now: No Patient Has Alerts: Yes Patient Alerts: DMII Electronic Signature(s) Signed: 10/29/2016 4:27:45 PM By: Alric Quan Entered By: Alric Quan on 10/29/2016 08:00:14 Gaza, [name] ([id number]) -------------------------------------------------------------------------------- Encounter Discharge Information Details Patient Name: [name] Brittle E. 10/29/2016 8:00 Date of Service: AM Medical Record [id number] Number: Patient Account Number: [id number] Date of Birth/Sex: 1950/12/15 (66 y.o. Male) Treating RN: Anastasio Champion, Other Clinician: Primary Care Akili Corsetti: Olivia Mackie Treating Britto, Cherie Ouch, Amardeep Beckers/Extender: Referring Ellin Fitzgibbons: Harless Nakayama in Treatment: 5 Encounter Discharge Information Items Discharge Pain Level:  0 Discharge Condition: Stable Ambulatory Status: Ambulatory Discharge Destination: Home Transportation: Private Auto Accompanied By: brother in law Schedule Follow-up Appointment: Yes Medication Reconciliation completed and provided to Patient/Care No Selvin Yun: Provided on Clinical Summary of Care: 10/29/2016 Form Type Recipient Paper Patient ED Electronic Signature(s) Signed: 10/29/2016 8:36:44 AM By: Ruthine Dose Entered By: Ruthine Dose on 10/29/2016 08:36:44 Samano, [name] ([id number]) -------------------------------------------------------------------------------- Lower Extremity Assessment Details Patient Name: [name], [name] 10/29/2016 8:00 Date of Service: AM Medical Record [id number] Number: Patient Account Number: [id number] Date of Birth/Sex: 03/01/1951 (66 y.o. Male) Treating RN: Anastasio Champion, Other Clinician: Primary Care Tresten Pantoja: Olivia Mackie Treating Britto, Cherie Ouch, Patrina Andreas/Extender: Referring Eugene Zeiders: Harless Nakayama in Treatment: 5 Vascular Assessment Pulses: Dorsalis Pedis Palpable: [Left:Yes] [Right:Yes] Posterior Tibial Extremity colors, hair growth, and conditions: Extremity Color: [Left:Hyperpigmented] [Right:Hyperpigmented] Temperature of Extremity: [Left:Warm] [Right:Warm] Capillary Refill: [Left:< 3 seconds] [Right:< 3 seconds] Electronic Signature(s) Signed: 10/29/2016 4:27:45 PM By: Alric Quan Entered By: Alric Quan on 10/29/2016 08:17:22 Appling, Ajdin Johnette Abraham ([id number]) -------------------------------------------------------------------------------- Multi Wound Chart Details Patient Name: [name] Brittle E. 10/29/2016 8:00 Date of Service: AM Medical Record [id number] Number: Patient Account Number: [id number] Date of Birth/Sex: Feb 28, 1951 (66 y.o. Male) Treating RN: Anastasio Champion, Other Clinician: Primary Care Tullio Chausse: Olivia Mackie Treating Britto, Cherie Ouch,  Carolyna Yerian/Extender: Referring Othman Masur: Harless Nakayama in Treatment: 5 Vital Signs Height(in): 69 Pulse(bpm): 69 Weight(lbs): 168 Blood Pressure 137/69 (mmHg): Body Mass Index(BMI): 25 Temperature(F): 98.2 Respiratory Rate 16 (breaths/min): Photos: [1:No Photos] [2:No Photos] [3:No Photos] Wound Location: [1:Right Toe Great] [2:Right Toe Second] [3:Right Toe Third] Wounding Event: [1:Gradually Appeared] [2:Gradually Appeared] [3:Gradually Appeared] Primary Etiology: [1:Diabetic Wound/Ulcer of the Lower Extremity] [2:Diabetic Wound/Ulcer of the Lower Extremity] [3:Diabetic Wound/Ulcer of the Lower Extremity] Secondary Etiology: [1:N/A] [2:N/A] [3:N/A] Comorbid History: [1:Anemia, Lymphedema, Congestive Heart Failure, Hypertension, Peripheral Venous Disease, Type II Diabetes, Neuropathy] [2:Anemia, Lymphedema, Congestive Heart Failure, Hypertension, Peripheral Venous Disease, Type II Diabetes,  Neuropathy] [3:Anemia, Lymphedema, Congestive Heart Failure, Hypertension, Peripheral Venous Disease, Type II Diabetes, Neuropathy] Date Acquired: [1:06/11/2016] [2:06/11/2016] [3:06/11/2016] Weeks of Treatment: [1:5] [2:5] [3:5] Wound  Status: [1:Open] [2:Open] [3:Open] Pending Amputation on Yes [2:Yes] [3:Yes] Presentation: Measurements L x W x D 2.5x2.5x0.1 [2:1.5x1.9x0.1] [3:0.1x0.1x0.1] (cm) Area (cm) : [1:4.909] [2:2.238] [3:0.008] Volume (cm) : [1:0.491] [2:0.224] [3:0.001] % Reduction in Area: [1:89.60%] [2:27.10%] [3:98.40%] % Reduction in Volume: 89.60% [2:27.00%] [3:98.00%] Classification: [1:Grade 1] [2:Grade 1] [3:Grade 1] Exudate Amount: [1:Large] [2:Large] [3:Large] Exudate Type: [1:Serosanguineous] [2:Serosanguineous] [3:Serous] Exudate Color: [1:red, brown Yes] [2:red, brown Yes] [3:amber Yes] Foul Odor After Cleansing: Odor Anticipated Due to No No No Product Use: Wound Margin: Flat and Intact Flat and Intact Flat and Intact Granulation Amount: Large (67-100%) Medium  (34-66%) Large (67-100%) Granulation Quality: Pink Pink Pink Necrotic Amount: Small (1-33%) Medium (34-66%) Small (1-33%) Exposed Structures: Fat Layer (Subcutaneous Fat Layer (Subcutaneous Fat Layer (Subcutaneous Tissue) Exposed: Yes Tissue) Exposed: Yes Tissue) Exposed: Yes Fascia: No Fascia: No Fascia: No Tendon: No Tendon: No Tendon: No Muscle: No Muscle: No Muscle: No Joint: No Joint: No Joint: No Bone: No Bone: No Bone: No Epithelialization: Large (67-100%) Small (1-33%) Small (1-33%) Debridement: Debridement ([id number] Debridement ([id number]- N/A 11047) 11047) Pre-procedure 08:20 08:20 N/A Verification/Time Out Taken: Pain Control: Lidocaine 4% Topical Lidocaine 4% Topical N/A Solution Solution Tissue Debrided: Fibrin/Slough, Exudates, Fibrin/Slough, Exudates, N/A Subcutaneous Subcutaneous Level: Skin/Subcutaneous Skin/Subcutaneous N/A Tissue Tissue Debridement Area (sq 6.25 2.85 N/A cm): Instrument: Curette Curette N/A Bleeding: Minimum Minimum N/A Hemostasis Achieved: Pressure Pressure N/A Procedural Pain: 0 0 N/A Post Procedural Pain: 0 0 N/A Debridement Treatment Procedure was tolerated Procedure was tolerated N/A Response: well well Post Debridement 2.5x2.5x0.1 1.5x1.9x0.1 N/A Measurements L x W x D (cm) Post Debridement 0.491 0.224 N/A Volume: (cm) Periwound Skin Texture: Excoriation: Yes Excoriation: Yes Excoriation: Yes Induration: No Induration: No Induration: No Callus: No Callus: No Callus: No Crepitus: No Crepitus: No Crepitus: No Rash: No Rash: No Rash: No Scarring: No Scarring: No Scarring: No Periwound Skin Maceration: Yes Maceration: Yes Maceration: No Moisture: Dry/Scaly: No Dry/Scaly: No Dry/Scaly: No Periwound Skin Color: [name], [name] Kitchen ([id number]) Atrophie Blanche: No Atrophie Blanche: No Atrophie Blanche: No Cyanosis: No Cyanosis: No Cyanosis: No Ecchymosis: No Ecchymosis: No Ecchymosis: No Erythema:  No Erythema: No Erythema: No Hemosiderin Staining: No Hemosiderin Staining: No Hemosiderin Staining: No Mottled: No Mottled: No Mottled: No Pallor: No Pallor: No Pallor: No Rubor: No Rubor: No Rubor: No Temperature: No Abnormality No Abnormality No Abnormality Tenderness on No No No Palpation: Wound Preparation: Ulcer Cleansing: Ulcer Cleansing: Ulcer Cleansing: Rinsed/Irrigated with Rinsed/Irrigated with Rinsed/Irrigated with Saline Saline Saline Topical Anesthetic Topical Anesthetic Topical Anesthetic Applied: Other: lidocaine Applied: Other: lidocaine Applied: Other: lidocaine 4% 4% 4% Procedures Performed: Debridement Debridement N/A Wound Number: [id number]  Photos: No Photos No Photos No Photos Wound Location: Right Foot - Dorsal Left Toe Great Left Toe Second Wounding Event: Gradually Appeared Gradually Appeared Gradually Appeared Primary 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 the Lower Extremity the Lower Extremity Secondary Etiology: N/A N/A N/A Comorbid History: Anemia, Lymphedema, Anemia, Lymphedema, Anemia, Lymphedema, Congestive Heart Failure, Congestive Heart Failure, Congestive Heart Failure, Hypertension, Peripheral Hypertension, Peripheral Hypertension, Peripheral Venous Disease, Type II Venous Disease, Type II Venous Disease, Type II Diabetes, Neuropathy Diabetes, Neuropathy Diabetes, Neuropathy Date Acquired: 06/11/2016 06/11/2016 06/11/2016 Weeks of Treatment: 5 5 5  Wound Status: Open Open Open Pending Amputation on Yes Yes Yes Presentation: Measurements L x W x D 3.2x3x0.1 0.1x0.1x0.1 0.8x0.9x0.1 (cm) Area (cm) : 7.54 0.008 0.565 Volume (cm) : 0.754 0.001 0.057 % Reduction in Area: -69.30% 99.90% 73.50% %  Reduction in Volume: -69.40% 99.90% 73.40% Classification: Grade 1 Grade 1 Grade 1 Exudate Amount: Large Large Large Exudate Type: Serous Serous Serous Exudate Color: amber amber  amber Foul Odor After Yes Yes Yes Cleansing: No No No [name], [name] ([id number]) Odor Anticipated Due to Product Use: Wound Margin: Distinct, outline attached Flat and Intact Flat and Intact Granulation Amount: None Present (0%) Large (67-100%) Large (67-100%) Granulation Quality: N/A Pink Pink Necrotic Amount: Large (67-100%) Small (1-33%) Small (1-33%) Exposed Structures: N/A Fat Layer (Subcutaneous Fat Layer (Subcutaneous Tissue) Exposed: Yes Tissue) Exposed: Yes Fascia: No Fascia: No Tendon: No Tendon: No Muscle: No Muscle: No Joint: No Joint: No Bone: No Bone: No Epithelialization: None Small (1-33%) Small (1-33%) Debridement: N/A N/A Debridement ([id number]) Pre-procedure N/A N/A 08:20 Verification/Time Out Taken: Pain Control: N/A N/A Lidocaine 4% Topical Solution Tissue Debrided: N/A N/A Fibrin/Slough, Exudates, Subcutaneous Level: N/A N/A Skin/Subcutaneous Tissue Debridement Area (sq N/A N/A 0.72 cm): Instrument: N/A N/A Curette Bleeding: N/A N/A Minimum Hemostasis Achieved: N/A N/A Pressure Procedural Pain: N/A N/A 0 Post Procedural Pain: N/A N/A 0 Debridement Treatment N/A N/A Procedure was tolerated Response: well Post Debridement N/A N/A 0.8x0.9x0.1 Measurements L x W x D (cm) Post Debridement N/A N/A 0.057 Volume: (cm) Periwound Skin Texture: No Abnormalities Noted Excoriation: Yes Excoriation: Yes Induration: No Induration: No Callus: No Callus: No Crepitus: No Crepitus: No Rash: No Rash: No Scarring: No Scarring: No Periwound Skin Maceration: Yes Maceration: No Maceration: Yes Moisture: Dry/Scaly: No Dry/Scaly: No Periwound Skin Color: No Abnormalities Noted Atrophie Blanche: No Atrophie Blanche: No Cyanosis: No Cyanosis: No Ecchymosis: No Ecchymosis: No [name], [name] ([id number]) Erythema: No Erythema: No Hemosiderin Staining: No Hemosiderin Staining: No Mottled: No Mottled: No Pallor: No Pallor: No Rubor:  No Rubor: No Temperature: No Abnormality No Abnormality No Abnormality Tenderness on No No No Palpation: Wound Preparation: Ulcer Cleansing: Ulcer Cleansing: Ulcer Cleansing: Rinsed/Irrigated with Rinsed/Irrigated with Rinsed/Irrigated with Saline Saline Saline Topical Anesthetic Topical Anesthetic Topical Anesthetic Applied: Other: lidocaine Applied: Other: lidocaine Applied: Other: lidocaine 4% 4% 4% Procedures Performed: N/A N/A Debridement Wound Number: 7 N/A N/A Photos: No Photos N/A N/A Wound Location: Left Toe Third N/A N/A Wounding Event: Gradually Appeared N/A N/A Primary Etiology: Diabetic Wound/Ulcer of N/A N/A the Lower Extremity Secondary Etiology: Pressure Ulcer N/A N/A Comorbid History: Anemia, Lymphedema, N/A N/A Congestive Heart Failure, Hypertension, Peripheral Venous Disease, Type II Diabetes, Neuropathy Date Acquired: 10/14/2016 N/A N/A Weeks of Treatment: 2 N/A N/A Wound Status: Open N/A N/A Pending Amputation on No N/A N/A Presentation: Measurements L x W x D 0.5x0.3x0.1 N/A N/A (cm) Area (cm) : 0.118 N/A N/A Volume (cm) : 0.012 N/A N/A % Reduction in Area: 92.30% N/A N/A % Reduction in Volume: 92.20% N/A N/A Classification: Grade 1 N/A N/A Exudate Amount: Large N/A N/A Exudate Type: Serous N/A N/A Exudate Color: amber N/A N/A Foul Odor After Yes N/A N/A Cleansing: Odor Anticipated Due to No N/A N/A Product Use: Wound Margin: Flat and Intact N/A N/A Frison, Yee E. ([id number]) Granulation Amount: Large (67-100%) N/A N/A Granulation Quality: Red N/A N/A Necrotic Amount: Small (1-33%) N/A N/A Exposed Structures: Fat Layer (Subcutaneous N/A N/A Tissue) Exposed: Yes Fascia: No Tendon: No Muscle: No Joint: No Bone: No Epithelialization: None N/A N/A Debridement: N/A N/A N/A Pain Control: N/A N/A N/A Tissue Debrided: N/A N/A N/A Level: N/A N/A N/A Debridement Area (sq N/A N/A N/A cm): Instrument: N/A N/A N/A Bleeding: N/A N/A  N/A Hemostasis Achieved: N/A N/A N/A  Procedural Pain: N/A N/A N/A Post Procedural Pain: N/A N/A N/A Debridement Treatment N/A N/A N/A Response: Post Debridement N/A N/A N/A Measurements L x W x D (cm) Post Debridement N/A N/A N/A Volume: (cm) Periwound Skin Texture: Excoriation: Yes N/A N/A Induration: No Callus: No Crepitus: No Rash: No Scarring: No Periwound Skin Maceration: No N/A N/A Moisture: Dry/Scaly: No Periwound Skin Color: Atrophie Blanche: No N/A N/A Cyanosis: No Ecchymosis: No Erythema: No Hemosiderin Staining: No Mottled: No Pallor: No Rubor: No Temperature: N/A N/A N/A Tenderness on No N/A N/A Palpation: Wound Preparation: Ulcer Cleansing: N/A N/A Rinsed/Irrigated with Koskinen, Gerson E. ([id number]) Saline Topical Anesthetic Applied: Other: lidocaine 4% Procedures Performed: N/A N/A N/A Treatment Notes Wound #1 (Right Toe Great) 1. Cleansed with: Clean wound with Normal Saline Cleanse wound with antibacterial soap and water 2. Anesthetic Topical Lidocaine 4% cream to wound bed prior to debridement 3. Peri-wound Care: Antifungal cream 4. Dressing Applied: Aquacel Ag 5. Secondary Dressing Applied Dry Gauze Kerlix/Conform 7. Secured with Tape Wound #2 (Right Toe Second) 1. Cleansed with: Clean wound with Normal Saline Cleanse wound with antibacterial soap and water 2. Anesthetic Topical Lidocaine 4% cream to wound bed prior to debridement 3. Peri-wound Care: Antifungal cream 4. Dressing Applied: Aquacel Ag 5. Secondary Dressing Applied Dry Gauze Kerlix/Conform 7. Secured with Tape Wound #3 (Right Toe Third) 1. Cleansed with: Clean wound with Normal Saline Cleanse wound with antibacterial soap and water 2. Anesthetic Topical Lidocaine 4% cream to wound bed prior to debridement 3. Peri-wound Care: JEDD, [place]. ([id number]) Antifungal cream 4. Dressing Applied: Aquacel Ag 5. Secondary Dressing Applied Dry  Gauze Kerlix/Conform 7. Secured with Tape Wound #4 (Right, Dorsal Foot) 1. Cleansed with: Clean wound with Normal Saline Cleanse wound with antibacterial soap and water 2. Anesthetic Topical Lidocaine 4% cream to wound bed prior to debridement 3. Peri-wound Care: Antifungal cream 4. Dressing Applied: Aquacel Ag 5. Secondary Dressing Applied Dry Gauze Kerlix/Conform 7. Secured with Tape Wound #5 (Left Toe Great) 1. Cleansed with: Clean wound with Normal Saline Cleanse wound with antibacterial soap and water 2. Anesthetic Topical Lidocaine 4% cream to wound bed prior to debridement 3. Peri-wound Care: Antifungal cream 4. Dressing Applied: Aquacel Ag 5. Secondary Dressing Applied Dry Gauze Kerlix/Conform 7. Secured with Tape Wound #6 (Left Toe Second) 1. Cleansed with: Clean wound with Normal Saline Cleanse wound with antibacterial soap and water 2. Anesthetic Topical Lidocaine 4% cream to wound bed prior to debridement Dise, [name] ([id number]) 3. Peri-wound Care: Antifungal cream 4. Dressing Applied: Aquacel Ag 5. Secondary Dressing Applied Dry Gauze Kerlix/Conform 7. Secured with Tape Wound #7 (Left Toe Third) 1. Cleansed with: Clean wound with Normal Saline Cleanse wound with antibacterial soap and water 2. Anesthetic Topical Lidocaine 4% cream to wound bed prior to debridement 3. Peri-wound Care: Antifungal cream 4. Dressing Applied: Aquacel Ag 5. Secondary Dressing Applied Dry Gauze Kerlix/Conform 7. Secured with Recruitment consultant) Signed: 10/29/2016 8:28:44 AM By: Christin Fudge MD, FACS Entered By: Christin Fudge on 10/29/2016 08:28:44 JAVION, HOLMER ([id number]) -------------------------------------------------------------------------------- [place] Details Patient Name: MEARL, OLVER 10/29/2016 8:00 Date of Service: AM Medical Record [id number] Number: Patient Account Number: [id number] Date of  Birth/Sex: [birth year] (66 y.o. Male) Treating RN: Anastasio Champion, Other Clinician: Primary Care Callie Bunyard: Olivia Mackie Treating Britto, Cherie Ouch, Breona Cherubin/Extender: Referring Deylan Canterbury: Harless Nakayama in Treatment: 5 Active Inactive ` Abuse / Safety / Falls / Self Care Management Nursing Diagnoses: Potential for falls Goals: Patient will remain injury  free related to falls Date Initiated: 09/21/2016 Target Resolution Date: 11/30/2016 Goal Status: Active Interventions: Assess fall risk on admission and as needed Notes: ` Nutrition Nursing Diagnoses: Potential for alteratiion in Nutrition/Potential for imbalanced nutrition Goals: Patient/caregiver agrees to and verbalizes understanding of need to use nutritional supplements and/or vitamins as prescribed Date Initiated: 09/21/2016 Target Resolution Date: 11/30/2016 Goal Status: Active Interventions: Assess patient nutrition upon admission and as needed per policy Notes: ` Orientation to the [place], [place]. ([id number]) Nursing Diagnoses: Knowledge deficit related to the wound healing center program Goals: Patient/caregiver will verbalize understanding of the [place] Date Initiated: 09/21/2016 Target Resolution Date: 11/30/2016 Goal Status: Active Interventions: Provide education on orientation to the wound center Notes: ` Wound/Skin Impairment Nursing Diagnoses: Knowledge deficit related to smoking impact on wound healing Goals: Ulcer/skin breakdown will have a volume reduction of 30% by week 4 Date Initiated: 09/21/2016 Target Resolution Date: 11/30/2016 Goal Status: Active Ulcer/skin breakdown will have a volume reduction of 50% by week 8 Date Initiated: 09/21/2016 Target Resolution Date: 11/30/2016 Goal Status: Active Ulcer/skin breakdown will have a volume reduction of 80% by week 12 Date Initiated: 09/21/2016 Target Resolution Date: 11/30/2016 Goal Status:  Active Ulcer/skin breakdown will heal within 14 weeks Date Initiated: 09/21/2016 Target Resolution Date: 11/30/2016 Goal Status: Active Interventions: Assess patient/caregiver ability to obtain necessary supplies Assess patient/caregiver ability to perform ulcer/skin care regimen upon admission and as needed Assess ulceration(s) every visit Notes: Electronic Signature(s) Signed: 10/29/2016 4:27:45 PM By: Alric Quan Entered By: Alric Quan on 10/29/2016 08:17:26 Mcmartin, [name] ([id number]) -------------------------------------------------------------------------------- Pain Assessment Details Patient Name: [name] Brittle E. 10/29/2016 8:00 Date of Service: AM Medical Record [id number] Number: Patient Account Number: [id number] Date of Birth/Sex: 1950/08/07 (66 y.o. Male) Treating RN: Anastasio Champion, Other Clinician: Primary Care Shawan Corella: Olivia Mackie Treating Britto, Cherie Ouch, Ata Pecha/Extender: Referring Artasia Thang: Harless Nakayama in Treatment: 5 Active Problems Location of Pain Severity and Description of Pain Patient Has Paino No Site Locations With Dressing Change: No Pain Management and Medication Current Pain Management: Electronic Signature(s) Signed: 10/29/2016 4:27:45 PM By: Alric Quan Entered By: Alric Quan on 10/29/2016 08:00:21 Uber, [name] ([id number]) -------------------------------------------------------------------------------- Patient/Caregiver Education Details Patient Name: [name] 10/29/2016 8:00 Date of Service: AM Medical Record [id number] Number: Patient Account Number: [id number] Date of Birth/Gender: 26-Oct-1950 (66 y.o. Male) Treating RN: Anastasio Champion, Other Clinician: Primary Care Physician: Olivia Mackie Treating Britto, Cherie Ouch, Physician/Extender: Referring Physician: Harless Nakayama in Treatment: 5 Education Assessment Education Provided To: Patient Education  Topics Provided Wound/Skin Impairment: Handouts: Other: change dressing as ordered Methods: Demonstration, Explain/Verbal Responses: State content correctly Electronic Signature(s) Signed: 10/29/2016 4:27:45 PM By: Alric Quan Entered By: Alric Quan on 10/29/2016 08:28:48 Paletta, [name] ([id number]) -------------------------------------------------------------------------------- Wound Assessment Details Patient Name: [name] Brittle E. 10/29/2016 8:00 Date of Service: AM Medical Record [id number] Number: Patient Account Number: [id number] Date of Birth/Sex: [birth year] (66 y.o. Male) Treating RN: Anastasio Champion, Other Clinician: Primary Care Amandalynn Pitz: Olivia Mackie Treating Britto, Cherie Ouch, Leven Hoel/Extender: Referring Thurl Boen: Harless Nakayama in Treatment: 5 Wound Status Wound Number: 1 Primary Diabetic Wound/Ulcer of the Lower Etiology: Extremity Wound Location: Right Toe Great Wound Open Wounding Event: Gradually Appeared Status: Date Acquired: 06/11/2016 Comorbid Anemia, Lymphedema, Congestive Weeks Of Treatment: 5 History: Heart Failure, Hypertension, Peripheral Clustered Wound: No Venous Disease, Type II Diabetes, Pending Amputation On Presentation Neuropathy Photos Photo Uploaded By: Alric Quan on 10/29/2016 11:39:32 Wound Measurements Length: (cm) 2.5 Width: (cm) 2.5 Depth: (cm) 0.1 Area: (cm)  4.909 Volume: (cm) 0.491 % Reduction in Area: 89.6% % Reduction in Volume: 89.6% Epithelialization: Large (67-100%) Tunneling: No Wound Description Classification: Grade 1 Foul Odor Afte Wound Margin: Flat and Intact Due to Product Exudate Amount: Large Slough/Fibrino Exudate Type: Serosanguineous Exudate Color: red, brown r Cleansing: Yes Use: No Yes Wound Bed Stellmach, Jaymien E. ([id number]) Granulation Amount: Large (67-100%) Exposed Structure Granulation Quality: Pink Fascia Exposed: No Necrotic Amount: Small (1-33%) Fat  Layer (Subcutaneous Tissue) Exposed: Yes Necrotic Quality: Adherent Slough Tendon Exposed: No Muscle Exposed: No Joint Exposed: No Bone Exposed: No Periwound Skin Texture Texture Color No Abnormalities Noted: No No Abnormalities Noted: No Callus: No Atrophie Blanche: No Crepitus: No Cyanosis: No Excoriation: Yes Ecchymosis: No Induration: No Erythema: No Rash: No Hemosiderin Staining: No Scarring: No Mottled: No Pallor: No Moisture Rubor: No No Abnormalities Noted: No Dry / Scaly: No Temperature / Pain Maceration: Yes Temperature: No Abnormality Wound Preparation Ulcer Cleansing: Rinsed/Irrigated with Saline Topical Anesthetic Applied: Other: lidocaine 4%, Treatment Notes Wound #1 (Right Toe Great) 1. Cleansed with: Clean wound with Normal Saline Cleanse wound with antibacterial soap and water 2. Anesthetic Topical Lidocaine 4% cream to wound bed prior to debridement 3. Peri-wound Care: Antifungal cream 4. Dressing Applied: Aquacel Ag 5. Secondary Dressing Applied Dry Gauze Kerlix/Conform 7. Secured with Recruitment consultant) Signed: 10/29/2016 4:27:45 PM By: Alric Quan Entered By: Alric Quan on 10/29/2016 08:10:45 Sisley, AARONJAMES KELSAY ([id number]) Reynoso, TIFFANY TALARICO ([id number]) -------------------------------------------------------------------------------- Wound Assessment Details Patient Name: BRADELY, RUDIN 10/29/2016 8:00 Date of Service: AM Medical Record [id number] Number: Patient Account Number: [id number] Date of Birth/Sex: 25-Jan-1951 (66 y.o. Male) Treating RN: Anastasio Champion, Other Clinician: Primary Care Myles Tavella: Olivia Mackie Treating Britto, Cherie Ouch, Nels Munn/Extender: Referring Darrius Montano: Harless Nakayama in Treatment: 5 Wound Status Wound Number: 2 Primary Diabetic Wound/Ulcer of the Lower Etiology: Extremity Wound Location: Right Toe Second Wound Open Wounding Event: Gradually  Appeared Status: Date Acquired: 06/11/2016 Comorbid Anemia, Lymphedema, Congestive Weeks Of Treatment: 5 History: Heart Failure, Hypertension, Peripheral Clustered Wound: No Venous Disease, Type II Diabetes, Pending Amputation On Presentation Neuropathy Photos Photo Uploaded By: Alric Quan on 10/29/2016 11:39:33 Wound Measurements Length: (cm) 1.5 Width: (cm) 1.9 Depth: (cm) 0.1 Area: (cm) 2.238 Volume: (cm) 0.224 % Reduction in Area: 27.1% % Reduction in Volume: 27% Epithelialization: Small (1-33%) Tunneling: No Undermining: No Wound Description Classification: Grade 1 Foul Odor Afte Wound Margin: Flat and Intact Due to Product Exudate Amount: Large Slough/Fibrino Exudate Type: Serosanguineous Exudate Color: red, brown r Cleansing: Yes Use: No Yes Wound Bed Guadamuz, Harvey E. ([id number]) Granulation Amount: Medium (34-66%) Exposed Structure Granulation Quality: Pink Fascia Exposed: No Necrotic Amount: Medium (34-66%) Fat Layer (Subcutaneous Tissue) Exposed: Yes Necrotic Quality: Adherent Slough Tendon Exposed: No Muscle Exposed: No Joint Exposed: No Bone Exposed: No Periwound Skin Texture Texture Color No Abnormalities Noted: No No Abnormalities Noted: No Callus: No Atrophie Blanche: No Crepitus: No Cyanosis: No Excoriation: Yes Ecchymosis: No Induration: No Erythema: No Rash: No Hemosiderin Staining: No Scarring: No Mottled: No Pallor: No Moisture Rubor: No No Abnormalities Noted: No Dry / Scaly: No Temperature / Pain Maceration: Yes Temperature: No Abnormality Wound Preparation Ulcer Cleansing: Rinsed/Irrigated with Saline Topical Anesthetic Applied: Other: lidocaine 4%, Treatment Notes Wound #2 (Right Toe Second) 1. Cleansed with: Clean wound with Normal Saline Cleanse wound with antibacterial soap and water 2. Anesthetic Topical Lidocaine 4% cream to wound bed prior to debridement 3. Peri-wound Care: Antifungal cream 4.  Dressing Applied: Aquacel Ag 5. Secondary Dressing Applied Dry  Gauze Kerlix/Conform 7. Secured with Recruitment consultant) Signed: 10/29/2016 4:27:45 PM By: Alric Quan Entered By: Alric Quan on 10/29/2016 08:10:33 Pattillo, NELVIN TOMB ([id number]) Selmon, RIKER COLLIER ([id number]) -------------------------------------------------------------------------------- Wound Assessment Details Patient Name: DRAYTON, TIEU 10/29/2016 8:00 Date of Service: AM Medical Record [id number] Number: Patient Account Number: [id number] Date of Birth/Sex: 09/05/1950 (66 y.o. Male) Treating RN: Anastasio Champion, Other Clinician: Primary Care Maurita Havener: Olivia Mackie Treating Britto, Cherie Ouch, Liliana Dang/Extender: Referring Brinly Maietta: Harless Nakayama in Treatment: 5 Wound Status Wound Number: 3 Primary Diabetic Wound/Ulcer of the Lower Etiology: Extremity Wound Location: Right Toe Third Wound Open Wounding Event: Gradually Appeared Status: Date Acquired: 06/11/2016 Comorbid Anemia, Lymphedema, Congestive Weeks Of Treatment: 5 History: Heart Failure, Hypertension, Peripheral Clustered Wound: No Venous Disease, Type II Diabetes, Pending Amputation On Presentation Neuropathy Photos Photo Uploaded By: Alric Quan on 10/29/2016 11:40:11 Wound Measurements Length: (cm) 0.1 Width: (cm) 0.1 Depth: (cm) 0.1 Area: (cm) 0.008 Volume: (cm) 0.001 % Reduction in Area: 98.4% % Reduction in Volume: 98% Epithelialization: Small (1-33%) Tunneling: No Undermining: No Wound Description Classification: Grade 1 Foul Odor After Wound Margin: Flat and Intact Due to Product Exudate Amount: Large Slough/Fibrino Exudate Type: Serous Exudate Color: amber Cleansing: Yes Use: No Yes Wound Bed [name], [name] ([id number]) Granulation Amount: Large (67-100%) Exposed Structure Granulation Quality: Pink Fascia Exposed: No Necrotic Amount: Small (1-33%) Fat Layer (Subcutaneous  Tissue) Exposed: Yes Necrotic Quality: Adherent Slough Tendon Exposed: No Muscle Exposed: No Joint Exposed: No Bone Exposed: No Periwound Skin Texture Texture Color No Abnormalities Noted: No No Abnormalities Noted: No Callus: No Atrophie Blanche: No Crepitus: No Cyanosis: No Excoriation: Yes Ecchymosis: No Induration: No Erythema: No Rash: No Hemosiderin Staining: No Scarring: No Mottled: No Pallor: No Moisture Rubor: No No Abnormalities Noted: No Dry / Scaly: No Temperature / Pain Maceration: No Temperature: No Abnormality Wound Preparation Ulcer Cleansing: Rinsed/Irrigated with Saline Topical Anesthetic Applied: Other: lidocaine 4%, Treatment Notes Wound #3 (Right Toe Third) 1. Cleansed with: Clean wound with Normal Saline Cleanse wound with antibacterial soap and water 2. Anesthetic Topical Lidocaine 4% cream to wound bed prior to debridement 3. Peri-wound Care: Antifungal cream 4. Dressing Applied: Aquacel Ag 5. Secondary Dressing Applied Dry Gauze Kerlix/Conform 7. Secured with Recruitment consultant) Signed: 10/29/2016 4:27:45 PM By: Alric Quan Entered By: Alric Quan on 10/29/2016 08:11:37 Cypert, JERARD BAYS ([id number]) Collings, KEWAN MCNEASE ([id number]) -------------------------------------------------------------------------------- Wound Assessment Details Patient Name: JADIAN, KARMAN 10/29/2016 8:00 Date of Service: AM Medical Record [id number] Number: Patient Account Number: [id number] Date of Birth/Sex: 23-Feb-1951 (66 y.o. Male) Treating RN: Anastasio Champion, Other Clinician: Primary Care Clayburn Weekly: Olivia Mackie Treating Britto, Cherie Ouch, Shantaya Bluestone/Extender: Referring Faryal Marxen: Harless Nakayama in Treatment: 5 Wound Status Wound Number: 4 Primary Diabetic Wound/Ulcer of the Lower Etiology: Extremity Wound Location: Right Foot - Dorsal Wound Open Wounding Event: Gradually Appeared Status: Date Acquired:  06/11/2016 Comorbid Anemia, Lymphedema, Congestive Weeks Of Treatment: 5 History: Heart Failure, Hypertension, Peripheral Clustered Wound: No Venous Disease, Type II Diabetes, Pending Amputation On Presentation Neuropathy Photos Photo Uploaded By: Alric Quan on 10/29/2016 11:40:12 Wound Measurements Length: (cm) 3.2 Width: (cm) 3 Depth: (cm) 0.1 Area: (cm) 7.54 Volume: (cm) 0.754 % Reduction in Area: -69.3% % Reduction in Volume: -69.4% Epithelialization: None Tunneling: No Wound Description Classification: Grade 1 Foul Odor After Wound Margin: Distinct, outline attached Due to Product Exudate Amount: Large Exudate Type: Serous Exudate Color: amber Cleansing: Yes Use: No Wound Bed [name], [name] ([id number]) Granulation Amount: None Present (0%) Necrotic Amount: Large (  67-100%) Necrotic Quality: Adherent Slough Periwound Skin Texture Texture Color No Abnormalities Noted: No No Abnormalities Noted: No Moisture Temperature / Pain No Abnormalities Noted: No Temperature: No Abnormality Maceration: Yes Wound Preparation Ulcer Cleansing: Rinsed/Irrigated with Saline Topical Anesthetic Applied: Other: lidocaine 4%, Treatment Notes Wound #4 (Right, Dorsal Foot) 1. Cleansed with: Clean wound with Normal Saline Cleanse wound with antibacterial soap and water 2. Anesthetic Topical Lidocaine 4% cream to wound bed prior to debridement 3. Peri-wound Care: Antifungal cream 4. Dressing Applied: Aquacel Ag 5. Secondary Dressing Applied Dry Gauze Kerlix/Conform 7. Secured with Recruitment consultant) Signed: 10/29/2016 4:27:45 PM By: Alric Quan Entered By: Alric Quan on 10/29/2016 08:12:40 Brotherton, [name] ([id number]) -------------------------------------------------------------------------------- Wound Assessment Details Patient Name: [name], MAFFEO 10/29/2016 8:00 Date of Service: AM Medical Record [id number] Number: Patient Account  Number: [id number] Date of Birth/Sex: [birth year] (66 y.o. Male) Treating RN: Anastasio Champion, Other Clinician: Primary Care Amato Sevillano: Olivia Mackie Treating Britto, Cherie Ouch, Mya Suell/Extender: Referring Kaylynne Andres: Harless Nakayama in Treatment: 5 Wound Status Wound Number: 5 Primary Diabetic Wound/Ulcer of the Lower Etiology: Extremity Wound Location: Left Toe Great Wound Open Wounding Event: Gradually Appeared Status: Date Acquired: 06/11/2016 Comorbid Anemia, Lymphedema, Congestive Weeks Of Treatment: 5 History: Heart Failure, Hypertension, Peripheral Clustered Wound: No Venous Disease, Type II Diabetes, Pending Amputation On Presentation Neuropathy Photos Photo Uploaded By: Alric Quan on 10/29/2016 11:41:22 Wound Measurements Length: (cm) 0.1 Width: (cm) 0.1 Depth: (cm) 0.1 Area: (cm) 0.008 Volume: (cm) 0.001 % Reduction in Area: 99.9% % Reduction in Volume: 99.9% Epithelialization: Small (1-33%) Tunneling: No Undermining: No Wound Description Classification: Grade 1 Foul Odor After Wound Margin: Flat and Intact Due to Product Exudate Amount: Large Slough/Fibrino Exudate Type: Serous Exudate Color: amber Cleansing: Yes Use: No Yes Wound Bed [name], [name] ([id number]) Granulation Amount: Large (67-100%) Exposed Structure Granulation Quality: Pink Fascia Exposed: No Necrotic Amount: Small (1-33%) Fat Layer (Subcutaneous Tissue) Exposed: Yes Necrotic Quality: Adherent Slough Tendon Exposed: No Muscle Exposed: No Joint Exposed: No Bone Exposed: No Periwound Skin Texture Texture Color No Abnormalities Noted: No No Abnormalities Noted: No Callus: No Atrophie Blanche: No Crepitus: No Cyanosis: No Excoriation: Yes Ecchymosis: No Induration: No Erythema: No Rash: No Hemosiderin Staining: No Scarring: No Mottled: No Pallor: No Moisture Rubor: No No Abnormalities Noted: No Dry / Scaly: No Temperature / Pain Maceration:  No Temperature: No Abnormality Wound Preparation Ulcer Cleansing: Rinsed/Irrigated with Saline Topical Anesthetic Applied: Other: lidocaine 4%, Treatment Notes Wound #5 (Left Toe Great) 1. Cleansed with: Clean wound with Normal Saline Cleanse wound with antibacterial soap and water 2. Anesthetic Topical Lidocaine 4% cream to wound bed prior to debridement 3. Peri-wound Care: Antifungal cream 4. Dressing Applied: Aquacel Ag 5. Secondary Dressing Applied Dry Gauze Kerlix/Conform 7. Secured with Recruitment consultant) Signed: 10/29/2016 4:27:45 PM By: Alric Quan Entered By: Alric Quan on 10/29/2016 08:13:36 Wolfinger, YUEPHENG SCHALLER ([id number]) Pucci, ZAI CHMIEL ([id number]) -------------------------------------------------------------------------------- Wound Assessment Details Patient Name: MARQUAL, MI 10/29/2016 8:00 Date of Service: AM Medical Record [id number] Number: Patient Account Number: [id number] Date of Birth/Sex: Sep 05, 1950 (66 y.o. Male) Treating RN: Anastasio Champion, Other Clinician: Primary Care Eliud Polo: Olivia Mackie Treating Britto, Cherie Ouch, Benna Arno/Extender: Referring Tirso Laws: Harless Nakayama in Treatment: 5 Wound Status Wound Number: 6 Primary Diabetic Wound/Ulcer of the Lower Etiology: Extremity Wound Location: Left Toe Second Wound Open Wounding Event: Gradually Appeared Status: Date Acquired: 06/11/2016 Comorbid Anemia, Lymphedema, Congestive Weeks Of Treatment: 5 History: Heart Failure, Hypertension, Peripheral Clustered Wound: No Venous Disease, Type II Diabetes,  Pending Amputation On Presentation Neuropathy Photos Photo Uploaded By: Alric Quan on 10/29/2016 11:41:23 Wound Measurements Length: (cm) 0.8 Width: (cm) 0.9 Depth: (cm) 0.1 Area: (cm) 0.565 Volume: (cm) 0.057 % Reduction in Area: 73.5% % Reduction in Volume: 73.4% Epithelialization: Small (1-33%) Tunneling: No Undermining:  No Wound Description Classification: Grade 1 Foul Odor After Wound Margin: Flat and Intact Due to Product Exudate Amount: Large Slough/Fibrino Exudate Type: Serous Exudate Color: amber Cleansing: Yes Use: No Yes Wound Bed [name], [name] ([id number]) Granulation Amount: Large (67-100%) Exposed Structure Granulation Quality: Pink Fascia Exposed: No Necrotic Amount: Small (1-33%) Fat Layer (Subcutaneous Tissue) Exposed: Yes Necrotic Quality: Adherent Slough Tendon Exposed: No Muscle Exposed: No Joint Exposed: No Bone Exposed: No Periwound Skin Texture Texture Color No Abnormalities Noted: No No Abnormalities Noted: No Callus: No Atrophie Blanche: No Crepitus: No Cyanosis: No Excoriation: Yes Ecchymosis: No Induration: No Erythema: No Rash: No Hemosiderin Staining: No Scarring: No Mottled: No Pallor: No Moisture Rubor: No No Abnormalities Noted: No Dry / Scaly: No Temperature / Pain Maceration: Yes Temperature: No Abnormality Wound Preparation Ulcer Cleansing: Rinsed/Irrigated with Saline Topical Anesthetic Applied: Other: lidocaine 4%, Treatment Notes Wound #6 (Left Toe Second) 1. Cleansed with: Clean wound with Normal Saline Cleanse wound with antibacterial soap and water 2. Anesthetic Topical Lidocaine 4% cream to wound bed prior to debridement 3. Peri-wound Care: Antifungal cream 4. Dressing Applied: Aquacel Ag 5. Secondary Dressing Applied Dry Gauze Kerlix/Conform 7. Secured with Recruitment consultant) Signed: 10/29/2016 4:27:45 PM By: Alric Quan Entered By: Alric Quan on 10/29/2016 08:14:47 Farra, ELJAY LAVE ([id number]) Parrack, AMIER HOYT ([id number]) -------------------------------------------------------------------------------- Wound Assessment Details Patient Name: [name], [name] 10/29/2016 8:00 Date of Service: AM Medical Record [id number] Number: Patient Account Number: [id number] Date of Birth/Sex: February 15, 1951 (66 y.o.  Male) Treating RN: Anastasio Champion, Other Clinician: Primary Care Froylan Hobby: Olivia Mackie Treating Britto, Cherie Ouch, Margarette Vannatter/Extender: Referring Silvanna Ohmer: Harless Nakayama in Treatment: 5 Wound Status Wound Number: 7 Primary Diabetic Wound/Ulcer of the Lower Etiology: Extremity Wound Location: Left Toe Third Secondary Pressure Ulcer Wounding Event: Gradually Appeared Etiology: Date Acquired: 10/14/2016 Wound Open Weeks Of Treatment: 2 Status: Clustered Wound: No Comorbid Anemia, Lymphedema, Congestive History: Heart Failure, Hypertension, Peripheral Venous Disease, Type II Diabetes, Neuropathy Photos Photo Uploaded By: Alric Quan on 10/29/2016 11:42:10 Wound Measurements Length: (cm) 0.5 Width: (cm) 0.3 Depth: (cm) 0.1 Area: (cm) 0.118 Volume: (cm) 0.012 % Reduction in Area: 92.3% % Reduction in Volume: 92.2% Epithelialization: None Tunneling: No Undermining: No Wound Description Classification: Grade 1 Foul Odor After Wound Margin: Flat and Intact Due to Product Exudate Amount: Large Slough/Fibrino Exudate Type: Serous Exudate Color: amber Muellner, Harshal E. ([id number]) Cleansing: Yes Use: No Yes Wound Bed Granulation Amount: Large (67-100%) Exposed Structure Granulation Quality: Red Fascia Exposed: No Necrotic Amount: Small (1-33%) Fat Layer (Subcutaneous Tissue) Exposed: Yes Necrotic Quality: Adherent Slough Tendon Exposed: No Muscle Exposed: No Joint Exposed: No Bone Exposed: No Periwound Skin Texture Texture Color No Abnormalities Noted: No No Abnormalities Noted: No Callus: No Atrophie Blanche: No Crepitus: No Cyanosis: No Excoriation: Yes Ecchymosis: No Induration: No Erythema: No Rash: No Hemosiderin Staining: No Scarring: No Mottled: No Pallor: No Moisture Rubor: No No Abnormalities Noted: No Dry / Scaly: No Maceration: No Wound Preparation Ulcer Cleansing: Rinsed/Irrigated with Saline Topical  Anesthetic Applied: Other: lidocaine 4%, Treatment Notes Wound #7 (Left Toe Third) 1. Cleansed with: Clean wound with Normal Saline Cleanse wound with antibacterial soap and water 2. Anesthetic Topical Lidocaine 4% cream to wound bed  prior to debridement 3. Peri-wound Care: Antifungal cream 4. Dressing Applied: Aquacel Ag 5. Secondary Dressing Applied Dry Gauze Kerlix/Conform 7. Secured with Recruitment consultant) Signed: 10/29/2016 4:27:45 PM By: Warnell Bureau ([id number]) Entered By: Alric Quan on 10/29/2016 08:15:32 Hainline, ORRIN YURKOVICH ([id number]) -------------------------------------------------------------------------------- Vitals Details Patient Name: SAMAN, UMSTEAD 10/29/2016 8:00 Date of Service: AM Medical Record [id number] Number: Patient Account Number: [id number] Date of Birth/Sex: 1951/03/10 (66 y.o. Male) Treating RN: Anastasio Champion, Other Clinician: Primary Care Varie Machamer: Olivia Mackie Treating Britto, Cherie Ouch, Latonya Knight/Extender: Referring Anacleto Batterman: Harless Nakayama in Treatment: 5 Vital Signs Time Taken: 08:00 Temperature (F): 98.2 Height (in): 69 Pulse (bpm): 69 Weight (lbs): 168 Respiratory Rate (breaths/min): 16 Body Mass Index (BMI): 24.8 Blood Pressure (mmHg): 137/69 Reference Range: 80 - 120 mg / dl Electronic Signature(s) Signed: 10/29/2016 4:27:45 PM By: Alric Quan Entered By: Alric Quan on 10/29/2016 08:00:38

## 2016-11-02 ENCOUNTER — Telehealth: Payer: Self-pay

## 2016-11-02 NOTE — Telephone Encounter (Signed)
  Mr. [name] has been informed of pts date change for Capsule study to 24/81/85 due to precert process.

## 2016-11-05 ENCOUNTER — Encounter: Payer: Medicare HMO | Admitting: Surgery

## 2016-11-05 DIAGNOSIS — E11621 Type 2 diabetes mellitus with foot ulcer: Secondary | ICD-10-CM | POA: Diagnosis not present

## 2016-11-06 NOTE — Progress Notes (Signed)
ZAY, YEARGAN ([id number]) Visit Report for 11/05/2016 Chief Complaint Document Details Patient Name: [name], [name] 11/05/2016 11:00 Date of Service: AM Medical Record [id number] Number: Patient Account Number: [id number] Date of Birth/Sex: [birth year] (66 y.o. Male) Treating RN: Elyn Peers, Other Clinician: Primary Care Provider: Olivia Mackie Treating Glendora Clouatre, Cherie Ouch, Provider/Extender: Referring Provider: Harless Nakayama in Treatment: 6 Information Obtained from: Patient Chief Complaint Patients presents for treatment of an open diabetic ulcer to both feet Electronic Signature(s) Signed: 11/05/2016 11:56:51 AM By: Christin Fudge MD, FACS Entered By: Christin Fudge on 11/05/2016 11:56:51 [name], [name] ([id number]) -------------------------------------------------------------------------------- HPI Details Patient Name: [name], [name] 11/05/2016 11:00 Date of Service: AM Medical Record [id number] Number: Patient Account Number: [id number] Date of Birth/Sex: 12/30/1950 (67 y.o. Male) Treating RN: Elyn Peers, Other Clinician: Primary Care Provider: Olivia Mackie Treating Jamiya Nims, Cherie Ouch, Provider/Extender: Referring Provider: Harless Nakayama in Treatment: 6 History of Present Illness Location: bilateral feet ulceration on the toes Quality: Patient reports experiencing a dull pain to affected area(s). Severity: Patient states wound are getting better Duration: Patient has had the wound for > 3 months prior to seeking treatment at the wound center Timing: Pain in wound is constant (hurts all the time) Context: The wound would happen gradually Modifying Factors: Other treatment(s) tried include:treatment for lymphedema and is seen by the podiatrist Dr. Caryl Comes Associated Signs and Symptoms: Patient reports having increase swelling. HPI Description: 66 year old patient here to see as for bilateral feet ulceration to on his left  first and second toe and 2 on his right first and second toe, which she's had for about 4 months. 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 Donella Stade. The patient is also undergoing workup by medical oncology for a intra-abdominal lymphadenopathy. 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 He is also status post appendectomy, inguinal lymph node biopsy, rectal biopsy and rectal examination under anesthesia. he currently smokes cigarettes about half packet a day. In [name] of this year he was seen by Dr. Hortencia Pilar, for evaluation of bilateral varicose veins and besides wearing compression stockings he had recommended laser ablation of the right and left great saphenous veins to eleviate the symptoms and complications of severe superficial venous reflux disease. He also recommended lymphedema pumps for better control of his lymphedema. The patient recently has had on 08/23/2016, right greater saphenous vein ablation with the laser energy Earlier lower extremity venous reflux examination done on 05/08/2016 showed no DVT or SVT both lower legs but incompetence of bilateral great saphenous veins was present. A lower arterial study was also done and there was no significant right lower and left lower extremity problems based on a normal toe brachial index bilaterally and the ABI was 1.221 the left and 1.23 on the right. His post ablation venous duplex examination showed successful ablation of the right GS vein with thrombus formation 2 below the right saphenofemoral junction. The deep system was patent without evidence of thrombosis and this was done on 08/30/2016. the patient also has a squamous cell cancer of the skin of the buttock and is recently  undergone radiation therapy for this prior to excisional surgery. Addendum: regarding his x-rays done today and x-ray of the left foot -- IMPRESSION: No objective Wolfson, Urban E. ([id number]) evidence of osteomyelitis.  There are soft tissue changes which may reflect cellulitis. X-ray of the right foot -- IMPRESSION:Findings compatible with cellulitis of the toes. No objective evidence of osteomyelitis is observed. 10/01/16 on evaluation today patient's wounds appeared to be doing some better. I did review the x-rays as well which showed no evidence of osteomyelitis although there was evidence on x-ray of cellulitis. He fortunately is not having any discomfort although he continues to have some swelling. He does not remember being on any antibiotics recently. 10/15/16 on evaluation today patient's wounds overall appear to be doing better although he does have a new location noted on the left foot. Fortunately he is not having significant pain. It almost has the appearance that something is rubbing on the end of his toes but he wears the open toe shoes and according to what he is telling me never wears anything that would rub on his foot. There is no evidence of infection and specifically no evidence of a fungal infection 10/22/16 On evaluation today patient's wounds appeared to be doing better compared to last week in regard to his bilateral lower extremities. Fortunately I happy with how things are progressing although he still has ulcers I feel like that he is improving and appropriate manner. Electronic Signature(s) Signed: 11/05/2016 11:56:55 AM By: Christin Fudge MD, FACS Entered By: Christin Fudge on 11/05/2016 11:56:55 [name], [name] ([id number]) -------------------------------------------------------------------------------- Physical Exam Details Patient Name: [name], [name] 11/05/2016 11:00 Date of Service: AM Medical Record [id number] Number: Patient Account Number: [id number] Date of  Birth/Sex: [birth year] (66 y.o. Male) Treating RN: Elyn Peers, Other Clinician: Primary Care Provider: Olivia Mackie Treating Maurie Musco, Cherie Ouch, Provider/Extender: Referring Provider: Harless Nakayama in Treatment: 6 Constitutional . Pulse regular. Respirations normal and unlabored. Afebrile. . Eyes Nonicteric. Reactive to light. Ears, Nose, Mouth, and Throat Lips, teeth, and gums WNL.Marland Kitchen Moist mucosa without lesions. Neck supple and nontender. No palpable supraclavicular or cervical adenopathy. Normal sized without goiter. Respiratory WNL. No retractions.. Cardiovascular Pedal Pulses WNL. No clubbing, cyanosis or edema. Lymphatic No adneopathy. No adenopathy. No adenopathy. Musculoskeletal Adexa without tenderness or enlargement.. Digits and nails w/o clubbing, cyanosis, infection, petechiae, ischemia, or inflammatory conditions.. Integumentary (Hair, Skin) No suspicious lesions. No crepitus or fluctuance. No peri-wound warmth or erythema. No masses.Marland Kitchen Psychiatric Judgement and insight Intact.. No evidence of depression, anxiety, or agitation.. Notes the maceration is much less today and the overall infection with fungal issues is also better. No sharp debridement was required today. Electronic Signature(s) Signed: 11/05/2016 11:57:38 AM By: Christin Fudge MD, FACS Entered By: Christin Fudge on 11/05/2016 11:57:37 Myint, ELIGA ARVIE ([id number]) -------------------------------------------------------------------------------- Physician Orders Details Patient Name: VERONICA, GUERRANT 11/05/2016 11:00 Date of Service: AM Medical Record [id number] Number: Patient Account Number: [id number] Date of Birth/Sex: 10/30/1950 (66 y.o. Male) Treating RN: Elyn Peers, Other Clinician: Primary Care Provider: Olivia Mackie Treating Anastasija Anfinson, Cherie Ouch, Provider/Extender: Referring Provider: Harless Nakayama in Treatment: 6 Verbal / Phone Orders:  No Diagnosis Coding Wound Cleansing Wound #1 Right Toe Great o Cleanse wound with mild soap and water o May Shower, gently pat wound dry prior to applying new dressing. Wound #2 Right Toe Second o Cleanse wound with mild soap and water o May Shower, gently pat wound dry prior to applying new dressing. Wound #3 Right Toe Third o Cleanse wound with mild soap and water o May Shower, gently pat wound dry prior to applying new dressing. Wound #4 Right,Dorsal Foot o Cleanse wound with mild soap and water o May Shower, gently pat  wound dry prior to applying new dressing. Wound #5 Left Toe Great o Cleanse wound with mild soap and water o May Shower, gently pat wound dry prior to applying new dressing. Wound #6 Left Toe Second o Cleanse wound with mild soap and water o May Shower, gently pat wound dry prior to applying new dressing. Wound #7 Left Toe Third o Cleanse wound with mild soap and water o May Shower, gently pat wound dry prior to applying new dressing. Anesthetic Wound #1 Right Toe Great o Topical Lidocaine 4% cream applied to wound bed prior to debridement Wound #2 Right Toe Second o Topical Lidocaine 4% cream applied to wound bed prior to debridement [name], [name] ([id number]) Wound #3 Right Toe Third o Topical Lidocaine 4% cream applied to wound bed prior to debridement Wound #4 Right,Dorsal Foot o Topical Lidocaine 4% cream applied to wound bed prior to debridement Wound #5 Left Toe Great o Topical Lidocaine 4% cream applied to wound bed prior to debridement Wound #6 Left Toe Second o Topical Lidocaine 4% cream applied to wound bed prior to debridement Wound #7 Left Toe Third o Topical Lidocaine 4% cream applied to wound bed prior to debridement Skin Barriers/Peri-Wound Care Wound #1 Right Toe Great o Antifungal cream - on and between toes then AqAg over areas Wound #2 Right Toe Second o Antifungal cream - on and between  toes then AqAg over areas Wound #3 Right Toe Third o Antifungal cream - on and between toes then AqAg over areas Wound #4 Right,Dorsal Foot o Antifungal cream - on and between toes then AqAg over areas Wound #5 Left Toe Great o Antifungal cream - on and between toes then AqAg over areas Wound #6 Left Toe Second o Antifungal cream - on and between toes then AqAg over areas Wound #7 Left Toe Third o Antifungal cream - on and between toes then AqAg over areas Primary Wound Dressing Wound #1 Right Toe Great o Aquacel Ag Wound #2 Right Toe Second o Aquacel Ag Wound #3 Right Toe Third o Aquacel Ag Wound #4 Right,Dorsal Foot [name], [name] ([id number]) o Aquacel Ag Wound #5 Left Toe Great o Aquacel Ag Wound #6 Left Toe Second o Aquacel Ag Wound #7 Left Toe Third o Aquacel Ag Secondary Dressing Wound #1 Right Toe Great o ABD pad o Gauze and Kerlix/Conform Wound #2 Right Toe Second o ABD pad o Gauze and Kerlix/Conform Wound #3 Right Toe Third o ABD pad o Gauze and Kerlix/Conform Wound #4 Right,Dorsal Foot o ABD pad o Gauze and Kerlix/Conform Wound #5 Left Toe Great o ABD pad o Gauze and Kerlix/Conform Wound #6 Left Toe Second o ABD pad o Gauze and Kerlix/Conform Wound #7 Left Toe Third o ABD pad o Gauze and Kerlix/Conform Dressing Change Frequency Wound #1 Right Toe Great o Change dressing every day. Wound #2 Right Toe Second o Change dressing every day. Wound #3 Right Toe Third Ahn, Layten E. ([id number]) o Change dressing every day. Wound #4 Right,Dorsal Foot o Change dressing every day. Wound #5 Left Toe Great o Change dressing every day. Wound #6 Left Toe Second o Change dressing every day. Wound #7 Left Toe Third o Change dressing every day. Follow-up Appointments Wound #1 Right Toe Great o Return Appointment in 1 week. Wound #2 Right Toe Second o Return Appointment in 1 week. Wound  #3 Right Toe Third o Return Appointment in 1 week. Wound #4 Right,Dorsal Foot o Return Appointment in 1 week. Wound #5 Left Toe  Great o Return Appointment in 1 week. Wound #6 Left Toe Second o Return Appointment in 1 week. Wound #7 Left Toe Third o Return Appointment in 1 week. Edema Control Wound #1 Right Toe Great o Patient to wear own compression stockings o Elevate legs to the level of the heart and pump ankles as often as possible Wound #2 Right Toe Second o Patient to wear own compression stockings o Elevate legs to the level of the heart and pump ankles as often as possible Wound #3 Right Toe Third o Patient to wear own compression stockings o Elevate legs to the level of the heart and pump ankles as often as possible Nowling, Ilijah E. ([id number]) Wound #4 Right,Dorsal Foot o Patient to wear own compression stockings o Elevate legs to the level of the heart and pump ankles as often as possible Wound #5 Left Toe Great o Patient to wear own compression stockings o Elevate legs to the level of the heart and pump ankles as often as possible Wound #6 Left Toe Second o Patient to wear own compression stockings o Elevate legs to the level of the heart and pump ankles as often as possible Wound #7 Left Toe Third o Patient to wear own compression stockings o Elevate legs to the level of the heart and pump ankles as often as possible Additional Orders / Instructions Wound #1 Right Toe Great o Stop Smoking - Please continue to stay away from alcohol o Other: - Please add vitamin A, vitamin C, and Zinc supplements to your diet Wound #2 Right Toe Second o Stop Smoking - Please continue to stay away from alcohol o Other: - Please add vitamin A, vitamin C, and Zinc supplements to your diet Wound #3 Right Toe Third o Stop Smoking - Please continue to stay away from alcohol o Other: - Please add vitamin A, vitamin C, and Zinc supplements  to your diet Wound #4 Right,Dorsal Foot o Stop Smoking - Please continue to stay away from alcohol o Other: - Please add vitamin A, vitamin C, and Zinc supplements to your diet Wound #5 Left Toe Great o Stop Smoking - Please continue to stay away from alcohol o Other: - Please add vitamin A, vitamin C, and Zinc supplements to your diet Wound #6 Left Toe Second o Stop Smoking - Please continue to stay away from alcohol o Other: - Please add vitamin A, vitamin C, and Zinc supplements to your diet Wound #7 Left Toe Third o Stop Smoking - Please continue to stay away from alcohol o Other: - Please add vitamin A, vitamin C, and Zinc supplements to your diet Home Health Wound #1 Right Toe AVIR, DERUITER ([id number]) o D/C Home Health Services - per patient request Wound #2 Right Toe Second o D/C Home Health Services - per patient request Wound #3 Right Toe Third o D/C Home Health Services - per patient request Wound #4 Right,Dorsal Foot o D/C Home Health Services - per patient request Wound #5 Left Toe Doristine Devoid o D/C Home Health Services - per patient request Wound #6 Left Toe Second o D/C Home Health Services - per patient request Wound #7 Left Toe Third o D/C Home Health Services - per patient request Electronic Signature(s) Signed: 11/05/2016 4:12:07 PM By: Christin Fudge MD, FACS Signed: 11/05/2016 4:55:34 PM By: Montey Hora Entered By: Montey Hora on 11/05/2016 11:38:43 [name], [name] ([id number]) -------------------------------------------------------------------------------- Problem List Details Patient Name: EZEKEIL, BETHEL E. 11/05/2016 11:00 Date of Service: AM Medical Record  [id number] Number: Patient Account Number: [id number] Date of Birth/Sex: 03-12-1951 (66 y.o. Male) Treating RN: Elyn Peers, Other Clinician: Primary Care Provider: Olivia Mackie Treating Daleiza Bacchi, Cherie Ouch, Provider/Extender: Referring  Provider: Harless Nakayama in Treatment: 6 Active Problems ICD-10 Encounter Code Description Active Date Diagnosis E11.621 Type 2 diabetes mellitus with foot ulcer 09/21/2016 Yes I87.313 Chronic venous hypertension (idiopathic) with ulcer of 09/21/2016 Yes bilateral lower extremity I89.0 Lymphedema, not elsewhere classified 09/21/2016 Yes L97.522 Non-pressure chronic ulcer of other part of left foot with fat 09/21/2016 Yes layer exposed L97.512 Non-pressure chronic ulcer of other part of right foot with 09/21/2016 Yes fat layer exposed F17.218 Nicotine dependence, cigarettes, 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/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 F10.19 Alcohol abuse with unspecified alcohol-induced disorder 09/21/2016 Yes Inactive Problems Resolved Problems Electronic Signature(s) JOVONNI, BORQUEZ ([id number]) Signed: 11/05/2016 11:56:27 AM By: Christin Fudge MD, FACS Entered By: Christin Fudge on 11/05/2016 11:56:27 Kondracki, [name] ([id number]) -------------------------------------------------------------------------------- Progress Note Details Patient Name: [name], [name] 11/05/2016 11:00 Date of Service: AM Medical Record [id number] Number: Patient Account Number: [id number] Date of Birth/Sex: Sep 14, 1950 (66 y.o. Male) Treating RN: Elyn Peers, Other Clinician: Primary Care Provider: Olivia Mackie Treating Fredrica Capano, Cherie Ouch, Provider/Extender: Referring Provider: Harless Nakayama in Treatment: 6 Subjective Chief Complaint Information obtained from Patient Patients presents for treatment of an open diabetic ulcer to both feet History of Present Illness (HPI) The following HPI elements were documented for the patient's wound: Location: bilateral feet ulceration on the toes Quality: Patient reports experiencing a dull pain to affected area(s). Severity: Patient states wound are getting better Duration: Patient has had the wound for > 3 months prior to seeking treatment at the wound  center Timing: Pain in wound is constant (hurts all the time) Context: The wound would happen gradually Modifying Factors: Other treatment(s) tried include:treatment for lymphedema and is seen by the podiatrist Dr. Caryl Comes Associated Signs and Symptoms: Patient reports having increase swelling. 66 year old patient here to see as for bilateral feet ulceration to on his left first and second toe and 2 on his right first and second toe, which she's had for about 4 months. 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 Donella Stade. The patient is also undergoing workup by medical oncology for a intra- abdominal lymphadenopathy. 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 He is also status post appendectomy, inguinal lymph node biopsy, rectal biopsy and rectal examination under anesthesia. he currently smokes cigarettes about half packet a day. In [name] of this year he was seen by Dr. Hortencia Pilar, for evaluation of bilateral varicose veins and besides wearing compression stockings he had recommended laser ablation of the right and left great saphenous veins to eleviate the symptoms and complications of severe superficial venous reflux disease. He also recommended lymphedema pumps for better control of his lymphedema. The patient recently has had on 08/23/2016, right greater saphenous vein ablation with the laser energy Earlier lower extremity venous reflux examination done on 05/08/2016 showed no DVT or SVT both lower legs but incompetence of bilateral great saphenous veins was present. A lower arterial study was also done and there was no significant right lower and left lower extremity problems based on a normal toe brachial index bilaterally and the ABI was  1.221 the left and 1.23 on the [place]ingsworth,  Chaka E. ([id number]) right. His post ablation venous duplex examination showed successful ablation of the right GS vein with thrombus formation 2 below the right saphenofemoral junction. The deep system was patent without evidence of thrombosis and this was done on 08/30/2016. 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 Addendum: regarding his x-rays done today and x-ray of the left foot -- IMPRESSION: No objective evidence of osteomyelitis. There are soft tissue changes which may reflect cellulitis. X-ray of the right foot -- IMPRESSION:Findings compatible with cellulitis of the toes. No objective evidence of osteomyelitis is observed. 10/01/16 on evaluation today patient's wounds appeared to be doing some better. I did review the x-rays as well which showed no evidence of osteomyelitis although there was evidence on x-ray of cellulitis. He fortunately is not having any discomfort although he continues to have some swelling. He does not remember being on any antibiotics recently. 10/15/16 on evaluation today patient's wounds overall appear to be doing better although he does have a new location noted on the left foot. Fortunately he is not having significant pain. It almost has the appearance that something is rubbing on the end of his toes but he wears the open toe shoes and according to what he is telling me never wears anything that would rub on his foot. There is no evidence of infection and specifically no evidence of a fungal infection 10/22/16 On evaluation today patient's wounds appeared to be doing better compared to last week in regard to his bilateral lower extremities. Fortunately I happy with how things are progressing although he still has ulcers I feel like that he is improving and appropriate manner. Objective Constitutional Pulse  regular. Respirations normal and unlabored. Afebrile. Vitals Time Taken: 11:18 AM, Height: 69 in, Weight: 168 lbs, BMI: 24.8, Temperature: 98.4 F, Pulse: 78 bpm, Respiratory Rate: 16 breaths/min, Blood Pressure: 152/73 mmHg. Eyes Nonicteric. Reactive to light. Ears, Nose, Mouth, and Throat Lips, teeth, and gums WNL.Marland Kitchen Moist mucosa without lesions. [name], [name] ([id number]) Neck supple and nontender. No palpable supraclavicular or cervical adenopathy. Normal sized without goiter. Respiratory WNL. No retractions.. Cardiovascular Pedal Pulses WNL. No clubbing, cyanosis or edema. Lymphatic No adneopathy. No adenopathy. No adenopathy. Musculoskeletal Adexa without tenderness or enlargement.. Digits and nails w/o clubbing, cyanosis, infection, petechiae, ischemia, or inflammatory conditions.Marland Kitchen Psychiatric Judgement and insight Intact.. No evidence of depression, anxiety, or agitation.. General Notes: the maceration is much less today and the overall infection with fungal issues is also better. No sharp debridement was required today. Integumentary (Hair, Skin) No suspicious lesions. No crepitus or fluctuance. No peri-wound warmth or erythema. No masses.. Wound #1 status is Open. Original cause of wound was Gradually Appeared. The wound is located on the Right Toe Great. The wound measures 0.9cm length x 3.1cm width x 0.1cm depth; 2.191cm^2 area and 0.219cm^3 volume. There is Fat Layer (Subcutaneous Tissue) Exposed exposed. There is no tunneling or undermining noted. There is a large amount of serosanguineous drainage noted. The wound margin is flat and intact. There is large (67-100%) pink granulation within the wound bed. There is a small (1-33%) amount of necrotic tissue within the wound bed including Adherent Slough. The periwound skin appearance exhibited: Excoriation, Maceration. The periwound skin appearance did not exhibit: Callus, Crepitus, Induration, Rash, Scarring, Dry/Scaly,  Atrophie Blanche, Cyanosis, Ecchymosis, Hemosiderin Staining, Mottled, Pallor, Rubor, Erythema. Periwound temperature was noted as No Abnormality. Wound #2  status is Open. Original cause of wound was Gradually Appeared. The wound is located on the Right Toe Second. The wound measures 0.6cm length x 0.9cm width x 0.1cm depth; 0.424cm^2 area and 0.042cm^3 volume. There is Fat Layer (Subcutaneous Tissue) Exposed exposed. There is no tunneling or undermining noted. There is a large amount of serosanguineous drainage noted. The wound margin is flat and intact. There is medium (34-66%) pink granulation within the wound bed. There is a medium (34-66%) amount of necrotic tissue within the wound bed including Adherent Slough. The periwound skin appearance exhibited: Excoriation, Maceration. The periwound skin appearance did not exhibit: Callus, Crepitus, Induration, Rash, Scarring, Dry/Scaly, Atrophie Blanche, Cyanosis, Ecchymosis, Hemosiderin Staining, Mottled, Pallor, Rubor, Erythema. Periwound temperature was noted as No Abnormality. Wound #3 status is Open. Original cause of wound was Gradually Appeared. The wound is located on the Right Toe Third. The wound measures 0.3cm length x 0.5cm width x 0.1cm depth; 0.118cm^2 area and 0.012cm^3 volume. There is Fat Layer (Subcutaneous Tissue) Exposed exposed. There is no tunneling or undermining noted. There is a large amount of serous drainage noted. The wound margin is flat and intact. There is large (67-100%) pink granulation within the wound bed. There is a small (1-33%) amount of [name], [name] ([id number]) necrotic tissue within the wound bed including Adherent Slough. The periwound skin appearance exhibited: Excoriation. The periwound skin appearance did not exhibit: Callus, Crepitus, Induration, Rash, Scarring, Dry/Scaly, Maceration, Atrophie Blanche, Cyanosis, Ecchymosis, Hemosiderin Staining, Mottled, Pallor, Rubor, Erythema. Periwound temperature  was noted as No Abnormality. Wound #4 status is Open. Original cause of wound was Gradually Appeared. The wound is located on the Right,Dorsal Foot. The wound measures 0.3cm length x 0.4cm width x 0.1cm depth; 0.094cm^2 area and 0.009cm^3 volume. There is no tunneling or undermining noted. There is a large amount of serous drainage noted. The wound margin is distinct with the outline attached to the wound base. There is no granulation within the wound bed. There is a large (67-100%) amount of necrotic tissue within the wound bed including Adherent Slough. The periwound skin appearance exhibited: Maceration. Periwound temperature was noted as No Abnormality. Wound #5 status is Open. Original cause of wound was Gradually Appeared. The wound is located on the Left Toe Great. The wound measures 3.1cm length x 0.6cm width x 0.1cm depth; 1.461cm^2 area and 0.146cm^3 volume. There is Fat Layer (Subcutaneous Tissue) Exposed exposed. There is no tunneling or undermining noted. There is a large amount of serous drainage noted. The wound margin is flat and intact. There is large (67-100%) pink granulation within the wound bed. There is a small (1-33%) amount of necrotic tissue within the wound bed including Adherent Slough. The periwound skin appearance exhibited: Excoriation. The periwound skin appearance did not exhibit: Callus, Crepitus, Induration, Rash, Scarring, Dry/Scaly, Maceration, Atrophie Blanche, Cyanosis, Ecchymosis, Hemosiderin Staining, Mottled, Pallor, Rubor, Erythema. Periwound temperature was noted as No Abnormality. Wound #6 status is Open. Original cause of wound was Gradually Appeared. The wound is located on the Left Toe Second. The wound measures 0.7cm length x 0.6cm width x 0.1cm depth; 0.33cm^2 area and 0.033cm^3 volume. There is Fat Layer (Subcutaneous Tissue) Exposed exposed. There is no tunneling or undermining noted. There is a large amount of serous drainage noted. The wound  margin is flat and intact. There is large (67-100%) pink granulation within the wound bed. There is a small (1-33%) amount of necrotic tissue within the wound bed including Adherent Slough. The periwound skin appearance exhibited: Excoriation, Maceration.  The periwound skin appearance did not exhibit: Callus, Crepitus, Induration, Rash, Scarring, Dry/Scaly, Atrophie Blanche, Cyanosis, Ecchymosis, Hemosiderin Staining, Mottled, Pallor, Rubor, Erythema. Periwound temperature was noted as No Abnormality. Wound #7 status is Open. Original cause of wound was Gradually Appeared. The wound is located on the Left Toe Third. The wound measures 0.4cm length x 0.3cm width x 0.1cm depth; 0.094cm^2 area and 0.009cm^3 volume. There is Fat Layer (Subcutaneous Tissue) Exposed exposed. There is no tunneling or undermining noted. There is a large amount of serous drainage noted. The wound margin is flat and intact. There is large (67-100%) red granulation within the wound bed. There is a small (1-33%) amount of necrotic tissue within the wound bed including Adherent Slough. The periwound skin appearance exhibited: Excoriation. The periwound skin appearance did not exhibit: Callus, Crepitus, Induration, Rash, Scarring, Dry/Scaly, Maceration, Atrophie Blanche, Cyanosis, Ecchymosis, Hemosiderin Staining, Mottled, Pallor, Rubor, Erythema. 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 Wound #2 Right Toe Second: Cleanse wound with mild soap and water May Shower, gently pat wound dry prior to applying new dressing. Wound #3 Right Toe Third: Cleanse wound with mild soap and water May Shower, gently pat wound dry prior to applying new dressing. Wound #4 Right,Dorsal Foot: Cleanse wound with mild soap and water May Shower, gently pat wound dry prior to applying new dressing. Wound #5 Left Toe Great: Cleanse wound with mild soap and water May Shower, gently pat wound dry prior to applying new dressing. Wound #6 Left Toe Second: Cleanse wound with mild soap and water May Shower, gently pat wound dry prior to applying new dressing. Wound #7 Left Toe Third: Cleanse wound with mild soap and water May Shower, gently pat wound dry prior to applying new dressing. Anesthetic: Wound #1 Right Toe Great: Topical Lidocaine 4% cream applied to wound bed prior to debridement Wound #2 Right Toe Second: Topical Lidocaine 4% cream applied to wound bed prior to debridement Wound #3 Right Toe Third: Topical Lidocaine 4% cream applied to wound bed prior to debridement Wound #4 Right,Dorsal Foot: Topical Lidocaine 4% cream applied to wound bed prior to debridement Wound #5 Left Toe Great: Topical Lidocaine 4% cream applied to wound bed prior to debridement Wound #6 Left Toe Second: RUFORD, DUDZINSKI ([id number]) Topical Lidocaine 4% cream applied to wound bed prior to debridement Wound #7 Left Toe Third: Topical Lidocaine 4% cream applied to wound bed prior to debridement Skin Barriers/Peri-Wound Care: Wound #1 Right Toe Great: Antifungal cream - on and between toes then AqAg over areas Wound #2 Right Toe Second: Antifungal cream - on and between toes then AqAg over areas Wound #3 Right Toe Third: Antifungal cream - on and between toes then  AqAg over areas Wound #4 Right,Dorsal Foot: Antifungal cream - on and between toes then AqAg over areas Wound #5 Left Toe Great: Antifungal cream - on and between toes then AqAg over areas Wound #  6 Left Toe Second: Antifungal cream - on and between toes then AqAg over areas Wound #7 Left Toe Third: Antifungal cream - on and between toes then AqAg over areas Primary Wound Dressing: Wound #1 Right Toe Great: Aquacel Ag Wound #2 Right Toe Second: Aquacel Ag Wound #3 Right Toe Third: Aquacel Ag Wound #4 Right,Dorsal Foot: Aquacel Ag Wound #5 Left Toe Great: Aquacel Ag Wound #6 Left Toe Second: Aquacel Ag Wound #7 Left Toe Third: Aquacel Ag Secondary Dressing: Wound #1 Right Toe Great: ABD pad Gauze and Kerlix/Conform Wound #2 Right Toe Second: ABD pad Gauze and Kerlix/Conform Wound #3 Right Toe Third: ABD pad Gauze and Kerlix/Conform Wound #4 Right,Dorsal Foot: ABD pad Gauze and Kerlix/Conform Wound #5 Left Toe Great: ABD pad Gauze and Kerlix/Conform Wound #6 Left Toe Second: ABD pad Devereux, Pamela E. ([id number]) Gauze and Kerlix/Conform Wound #7 Left Toe Third: ABD pad Gauze and Kerlix/Conform Dressing Change Frequency: Wound #1 Right Toe Great: Change dressing every day. Wound #2 Right Toe Second: Change dressing every day. Wound #3 Right Toe Third: Change dressing every day. Wound #4 Right,Dorsal Foot: Change dressing every day. Wound #5 Left Toe Great: Change dressing every day. Wound #6 Left Toe Second: Change dressing every day. Wound #7 Left Toe Third: Change dressing every day. Follow-up Appointments: Wound #1 Right Toe Great: Return Appointment in 1 week. Wound #2 Right Toe Second: Return Appointment in 1 week. Wound #3 Right Toe Third: Return Appointment in 1 week. Wound #4 Right,Dorsal Foot: Return Appointment in 1 week. Wound #5 Left Toe Great: Return Appointment in 1 week. Wound #6 Left Toe Second: Return Appointment in 1 week. Wound  #7 Left Toe Third: Return Appointment in 1 week. Edema Control: Wound #1 Right Toe Great: Patient to wear own compression stockings Elevate legs to the level of the heart and pump ankles as often as possible Wound #2 Right Toe Second: Patient to wear own compression stockings Elevate legs to the level of the heart and pump ankles as often as possible Wound #3 Right Toe Third: Patient to wear own compression stockings Elevate legs to the level of the heart and pump ankles as often as possible Wound #4 Right,Dorsal Foot: Patient to wear own compression stockings Elevate legs to the level of the heart and pump ankles as often as possible Wound #5 Left Toe Great: Patient to wear own compression stockings Elevate legs to the level of the heart and pump ankles as often as possible Wound #6 Left Toe Second: Mifsud, Jonel E. ([id number]) Patient to wear own compression stockings Elevate legs to the level of the heart and pump ankles as often as possible Wound #7 Left Toe Third: Patient to wear own compression stockings Elevate legs to the level of the heart and pump ankles as often as possible Additional Orders / Instructions: Wound #1 Right Toe Great: Stop Smoking - Please continue to stay away from alcohol Other: - Please add vitamin A, vitamin C, and Zinc supplements to your diet Wound #2 Right Toe Second: Stop Smoking - Please continue to stay away from alcohol Other: - Please add vitamin A, vitamin C, and Zinc supplements to your diet Wound #3 Right Toe Third: Stop Smoking - Please continue to stay away from alcohol Other: - Please add vitamin A, vitamin C, and Zinc supplements to your diet Wound #4 Right,Dorsal Foot: Stop Smoking - Please continue to stay away from alcohol Other: - Please add vitamin A, vitamin C, and Zinc supplements to your diet  Wound #5 Left Toe Great: Stop Smoking - Please continue to stay away from alcohol Other: - Please add vitamin A, vitamin C, and Zinc  supplements to your diet Wound #6 Left Toe Second: Stop Smoking - Please continue to stay away from alcohol Other: - Please add vitamin A, vitamin C, and Zinc supplements to your diet Wound #7 Left Toe Third: Stop Smoking - Please continue to stay away from alcohol Other: - Please add vitamin A, vitamin C, and Zinc supplements to your diet Home Health: Wound #1 Right Toe Great: D[facility] Services - per patient request Wound #2 Right Toe Second: D/[facility] - per patient request Wound #3 Right Toe Third: [facility] - per patient request Wound #4 Right,Dorsal Foot: D/[facility] - per patient request Wound #5 Left Toe Great: [facility] - per patient request Wound #6 Left Toe Second: D/[facility] - per patient request Wound #7 Left Toe Third: D/[facility] - per patient request the overall picture has improved since last week and after review today I have recommended: Youngblood, Stanislav E. ([id number]) 1. Silver alginate to be applied over his wounds, and change daily after washing with soap and water. 2. Lotrisone ointment to be applied liberally to his toes and interdigital areas 3. Good control of his diabetes mellitus 4. the need to completely give up smoking discussed with him in great detail 5. regular visits the wound center Electronic Signature(s) Signed: 11/05/2016 11:59:02 AM By: Christin Fudge MD, FACS Entered By: Christin Fudge on 11/05/2016 11:59:02 Klepper, [name] ([id number]) -------------------------------------------------------------------------------- SuperBill Details Patient Name: Spychalski, Marquan E. Date of Service: 11/05/2016 Medical Record Number: [id number] Patient Account Number: [id number] Date of Birth/Sex: Feb 11, 1951 (66 y.o. 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.512 Non-pressure chronic ulcer of other part of right foot with fat layer exposed F17.218 Nicotine dependence, cigarettes, 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 abuse with unspecified alcohol-induced disorder Facility Procedures CPT4 Code: [id number] Description: 84665 - WOUND CARE VISIT-LEV 5 EST PT Modifier: Quantity: 1 Physician Procedures CPT4 Code Description: [id number] - WC PHYS LEVEL 3 - EST PT ICD-10 Description Diagnosis E11.621 Type 2 diabetes mellitus with foot ulcer L97.522 Non-pressure chronic ulcer of other part of left foo L97.512 Non-pressure chronic ulcer of other part of  right fo I89.0 Lymphedema, not elsewhere classified Modifier: t with fat layer ot with fat laye Quantity: 1 exposed [name] exposed Electronic Signature(s) Signed: 11/05/2016 12:01:40 PM By: Montey Hora Signed: 11/05/2016 4:12:07 PM By: Christin Fudge MD, FACS Previous Signature: 11/05/2016 11:59:37 AM Version By: Christin Fudge MD, FACS Entered By: Montey Hora on 11/05/2016 12:01:40

## 2016-11-06 NOTE — Progress Notes (Signed)
[name], [name] ([id number]) Visit Report for 11/05/2016 Arrival Information Details Patient Name: [name], [name] 11/05/2016 11:00 Date of Service: AM Medical Record [id number] Number: Patient Account Number: [id number] Date of Birth/Sex: 04/08/1951 (66 y.o. Male) Treating RN: Elyn Peers, Other Clinician: Primary Care Harshika Mago: Olivia Mackie Treating Britto, Cherie Ouch, Arland Usery/Extender: Referring Wrangler Penning: Harless Nakayama in Treatment: 6 Visit Information History Since Last Visit Added or deleted any medications: No Patient Arrived: Ambulatory Any new allergies or adverse reactions: No Arrival Time: 11:15 Had a fall or experienced change in No Accompanied By: brother activities of daily living that may affect Transfer Assistance: None risk of falls: Patient Identification Verified: Yes Signs or symptoms of abuse/neglect since last No Secondary Verification Process Yes visito Completed: Hospitalized since last visit: No Patient Requires Transmission-Based No Has Dressing in Place as Prescribed: Yes Precautions: Pain Present Now: No Patient Has Alerts: Yes Patient Alerts: DMII Electronic Signature(s) Signed: 11/05/2016 4:55:34 PM By: Montey Hora Entered By: Montey Hora on 11/05/2016 11:16:00 [name], [name] ([id number]) -------------------------------------------------------------------------------- Clinic Level of Care Assessment Details Patient Name: [name], [name] 11/05/2016 11:00 Date of Service: AM Medical Record [id number] Number: Patient Account Number: [id number] Date of Birth/Sex: [birth year] (66 y.o. Male) Treating RN: Elyn Peers, Other Clinician: Primary Care Leeloo Silverthorne: Olivia Mackie Treating Britto, Cherie Ouch, Annsley Akkerman/Extender: Referring Gar Glance: Harless Nakayama in Treatment: 6 Clinic Level of Care Assessment Items TOOL 4 Quantity Score []  - Use when only an EandM is performed on FOLLOW-UP visit  0 ASSESSMENTS - Nursing Assessment / Reassessment X - Reassessment of Co-morbidities (includes updates in patient status) 1 10 X - Reassessment of Adherence to Treatment Plan 1 5 ASSESSMENTS - Wound and Skin Assessment / Reassessment []  - Simple Wound Assessment / Reassessment - one wound 0 X - Complex Wound Assessment / Reassessment - multiple wounds 7 5 []  - Dermatologic / Skin Assessment (not related to wound area) 0 ASSESSMENTS - Focused Assessment []  - Circumferential Edema Measurements - multi extremities 0 []  - Nutritional Assessment / Counseling / Intervention 0 X - Lower Extremity Assessment (monofilament, tuning fork, pulses) 1 5 []  - Peripheral Arterial Disease Assessment (using hand held doppler) 0 ASSESSMENTS - Ostomy and/or Continence Assessment and Care []  - Incontinence Assessment and Management 0 []  - Ostomy Care Assessment and Management (repouching, etc.) 0 PROCESS - Coordination of Care X - Simple Patient / Family Education for ongoing care 1 15 []  - Complex (extensive) Patient / Family Education for ongoing care 0 []  - Staff obtains Consents, Records, Test Results / Process Orders 0 [name], [name] GARFIELD ([id number]) []  - Staff telephones HHA, Nursing Homes / Clarify orders / etc 0 []  - Routine Transfer to another Facility (non-emergent condition) 0 []  - Routine Hospital Admission (non-emergent condition) 0 []  - New Admissions / Biomedical engineer / Ordering NPWT, Apligraf, etc. 0 []  - Emergency Hospital Admission (emergent condition) 0 X - Simple Discharge Coordination 1 10 []  - Complex (extensive) Discharge Coordination 0 PROCESS - Special Needs []  - Pediatric / Minor Patient Management 0 []  - Isolation Patient Management 0 []  - Hearing / Language / Visual special needs 0 []  - Assessment of Community assistance (transportation, D/C planning, etc.) 0 []  - Additional assistance / Altered mentation 0 []  - Support Surface(s) Assessment (bed, cushion, seat, etc.)  0 INTERVENTIONS - Wound Cleansing / Measurement []  - Simple Wound Cleansing - one wound 0 X - Complex Wound Cleansing - multiple wounds 7 5 X - Wound Imaging (photographs - any number of wounds) 1  5 []  - Wound Tracing (instead of photographs) 0 []  - Simple Wound Measurement - one wound 0 X - Complex Wound Measurement - multiple wounds 7 5 INTERVENTIONS - Wound Dressings X - Small Wound Dressing one or multiple wounds 7 10 []  - Medium Wound Dressing one or multiple wounds 0 []  - Large Wound Dressing one or multiple wounds 0 X - Application of Medications - topical 1 5 []  - Application of Medications - injection 0 [name], [name] ([id number]) INTERVENTIONS - Miscellaneous []  - External ear exam 0 []  - Specimen Collection (cultures, biopsies, blood, body fluids, etc.) 0 []  - Specimen(s) / Culture(s) sent or taken to Lab for analysis 0 []  - Patient Transfer (multiple staff / Harrel Lemon Lift / Similar devices) 0 []  - Simple Staple / Suture removal (25 or less) 0 []  - Complex Staple / Suture removal (26 or more) 0 []  - Hypo / Hyperglycemic Management (close monitor of Blood Glucose) 0 []  - Ankle / Brachial Index (ABI) - do not check if billed separately 0 X - Vital Signs 1 5 Has the patient been seen at the hospital within the last three years: Yes Total Score: 235 Level Of Care: New/Established - Level 5 Electronic Signature(s) Signed: 11/05/2016 4:55:34 PM By: Montey Hora Entered By: Montey Hora on 11/05/2016 12:01:24 [name], [name] ([id number]) -------------------------------------------------------------------------------- Encounter Discharge Information Details Patient Name: [name], [name] 11/05/2016 11:00 Date of Service: AM Medical Record [id number] Number: Patient Account Number: [id number] Date of Birth/Sex: November 11, 1950 (66 y.o. Male) Treating RN: Elyn Peers, Other Clinician: Primary Care Jonisha Kindig: Olivia Mackie Treating Britto, Cherie Ouch,  Graylon Amory/Extender: Referring Riah Kehoe: Harless Nakayama in Treatment: 6 Encounter Discharge Information Items Discharge Pain Level: 0 Discharge Condition: Stable Ambulatory Status: Ambulatory Discharge Destination: Home Transportation: Private Auto Accompanied By: brother Schedule Follow-up Appointment: Yes Medication Reconciliation completed and provided to Patient/Care No Mike Hamre: Provided on Clinical Summary of Care: 11/05/2016 Form Type Recipient Paper Patient ED Electronic Signature(s) Signed: 11/05/2016 11:55:30 AM By: Ruthine Dose Entered By: Ruthine Dose on 11/05/2016 11:55:30 Cragle, [name] ([id number]) -------------------------------------------------------------------------------- Lower Extremity Assessment Details Patient Name: [name], [name] 11/05/2016 11:00 Date of Service: AM Medical Record [id number] Number: Patient Account Number: [id number] Date of Birth/Sex: [birth year] (66 y.o. Male) Treating RN: Elyn Peers, Other Clinician: Primary Care Leonette Tischer: Olivia Mackie Treating Britto, Cherie Ouch, Alyas Creary/Extender: Referring Trana Ressler: Harless Nakayama in Treatment: 6 Vascular Assessment Pulses: Dorsalis Pedis Palpable: [Left:Yes] [Right:Yes] Posterior Tibial Extremity colors, hair growth, and conditions: Extremity Color: [Left:Hyperpigmented] [Right:Hyperpigmented] Hair Growth on Extremity: [Left:No] [Right:No] Temperature of Extremity: [Left:Warm] [Right:Warm] Capillary Refill: [Left:< 3 seconds] [Right:< 3 seconds] Electronic Signature(s) Signed: 11/05/2016 4:55:34 PM By: Montey Hora Entered By: Montey Hora on 11/05/2016 11:35:54 Tippin, [name] ([id number]) -------------------------------------------------------------------------------- Multi Wound Chart Details Patient Name: [name] Brittle E. 11/05/2016 11:00 Date of Service: AM Medical Record [id number] Number: Patient Account Number: [id number] Date of Birth/Sex: 1950/04/12  (66 y.o. Male) Treating RN: Elyn Peers, Other Clinician: Primary Care Muriel Wilber: Olivia Mackie Treating Britto, Cherie Ouch, Marcena Dias/Extender: Referring Marnesha Gagen: Harless Nakayama in Treatment: 6 Vital Signs Height(in): 69 Pulse(bpm): 78 Weight(lbs): 168 Blood Pressure 152/73 (mmHg): Body Mass Index(BMI): 25 Temperature(F): 98.4 Respiratory Rate 16 (breaths/min): Photos: [1:No Photos] [2:No Photos] [3:No Photos] Wound Location: [1:Right Toe Great] [2:Right Toe Second] [3:Right Toe Third] Wounding Event: [1:Gradually Appeared] [2:Gradually Appeared] [3:Gradually Appeared] Primary Etiology: [1:Diabetic Wound/Ulcer of the Lower Extremity] [2:Diabetic Wound/Ulcer of the Lower Extremity] [3:Diabetic Wound/Ulcer of the Lower Extremity] Secondary Etiology: [1:N/A] [2:N/A] [3:N/A] Comorbid History: [1:Anemia, Lymphedema, Congestive Heart  Failure, Hypertension, Peripheral Venous Disease, Type II Diabetes, Neuropathy] [2:Anemia, Lymphedema, Congestive Heart Failure, Hypertension, Peripheral Venous Disease, Type II Diabetes,  Neuropathy] [3:Anemia, Lymphedema, Congestive Heart Failure, Hypertension, Peripheral Venous Disease, Type II Diabetes, Neuropathy] Date Acquired: [1:06/11/2016] [2:06/11/2016] [3:06/11/2016] Weeks of Treatment: [1:6] [2:6] [3:6] Wound Status: [1:Open] [2:Open] [3:Open] Pending Amputation on Yes [2:Yes] [3:Yes] Presentation: Measurements L x W x D 0.9x3.1x0.1 [2:0.6x0.9x0.1] [3:0.3x0.5x0.1] (cm) Area (cm) : [1:2.191] [2:0.424] [3:0.118] Volume (cm) : [1:0.219] [2:0.042] [3:0.012] % Reduction in Area: [1:95.40%] [2:86.20%] [3:76.50%] % Reduction in Volume: 95.40% [2:86.30%] [3:76.00%] Classification: [1:Grade 1] [2:Grade 1] [3:Grade 1] Exudate Amount: [1:Large] [2:Large] [3:Large] Exudate Type: [1:Serosanguineous] [2:Serosanguineous] [3:Serous] Exudate Color: [1:red, brown Yes] [2:red, brown Yes] [3:amber Yes] Foul Odor After Cleansing: Odor  Anticipated Due to No No No Product Use: Wound Margin: Flat and Intact Flat and Intact Flat and Intact Granulation Amount: Large (67-100%) Medium (34-66%) Large (67-100%) Granulation Quality: Pink Pink Pink Necrotic Amount: Small (1-33%) Medium (34-66%) Small (1-33%) Exposed Structures: Fat Layer (Subcutaneous Fat Layer (Subcutaneous Fat Layer (Subcutaneous Tissue) Exposed: Yes Tissue) Exposed: Yes Tissue) Exposed: Yes Fascia: No Fascia: No Fascia: No Tendon: No Tendon: No Tendon: No Muscle: No Muscle: No Muscle: No Joint: No Joint: No Joint: No Bone: No Bone: No Bone: No Epithelialization: Large (67-100%) Small (1-33%) Small (1-33%) Periwound Skin Texture: Excoriation: Yes Excoriation: Yes Excoriation: Yes Induration: No Induration: No Induration: No Callus: No Callus: No Callus: No Crepitus: No Crepitus: No Crepitus: No Rash: No Rash: No Rash: No Scarring: No Scarring: No Scarring: No Periwound Skin Maceration: Yes Maceration: Yes Maceration: No Moisture: Dry/Scaly: No Dry/Scaly: No Dry/Scaly: No Periwound Skin Color: Atrophie Blanche: No Atrophie Blanche: No Atrophie Blanche: No Cyanosis: No Cyanosis: No Cyanosis: No Ecchymosis: No Ecchymosis: No Ecchymosis: No Erythema: No Erythema: No Erythema: No Hemosiderin Staining: No Hemosiderin Staining: No Hemosiderin Staining: No Mottled: No Mottled: No Mottled: No Pallor: No Pallor: No Pallor: No Rubor: No Rubor: No Rubor: No Temperature: No Abnormality No Abnormality No Abnormality Tenderness on No No No Palpation: Wound Preparation: Ulcer Cleansing: Ulcer Cleansing: Ulcer Cleansing: Rinsed/Irrigated with Rinsed/Irrigated with Rinsed/Irrigated with Saline Saline Saline Topical Anesthetic Topical Anesthetic Topical Anesthetic Applied: Other: lidocaine Applied: Other: lidocaine Applied: Other: lidocaine 4% 4% 4% Wound Number: 4 [id number]  Photos: No Photos No Photos No Photos Wound Location:  Right Foot - Dorsal Left Toe Great Left Toe Second Wounding Event: Gradually Appeared Gradually Appeared Gradually Appeared Primary 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 the Lower Extremity the Lower Extremity Secondary Etiology: N/A N/A N/A [name], [name] ([id number]) Comorbid History: Anemia, Lymphedema, Anemia, Lymphedema, Anemia, Lymphedema, Congestive Heart Failure, Congestive Heart Failure, Congestive Heart Failure, Hypertension, Peripheral Hypertension, Peripheral Hypertension, Peripheral Venous Disease, Type II Venous Disease, Type II Venous Disease, Type II Diabetes, Neuropathy Diabetes, Neuropathy Diabetes, Neuropathy Date Acquired: 06/11/2016 06/11/2016 06/11/2016 Weeks of Treatment: 6 6 6  Wound Status: Open Open Open Pending Amputation on Yes Yes Yes Presentation: Measurements L x W x D 0.3x0.4x0.1 3.1x0.6x0.1 0.7x0.6x0.1 (cm) Area (cm) : 0.094 1.461 0.33 Volume (cm) : 0.009 0.146 0.033 % Reduction in Area: 97.90% 89.80% 84.60% % Reduction in Volume: 98.00% 89.80% 84.60% Classification: Grade 1 Grade 1 Grade 1 Exudate Amount: Large Large Large Exudate Type: Serous Serous Serous Exudate Color: amber amber amber Foul Odor After Yes Yes Yes Cleansing: Odor Anticipated Due to No No No Product Use: Wound Margin: Distinct, outline attached Flat and Intact Flat and Intact Granulation Amount: None Present (0%)  Large (67-100%) Large (67-100%) Granulation Quality: N/A Pink Pink Necrotic Amount: Large (67-100%) Small (1-33%) Small (1-33%) Exposed Structures: N/A Fat Layer (Subcutaneous Fat Layer (Subcutaneous Tissue) Exposed: Yes Tissue) Exposed: Yes Fascia: No Fascia: No Tendon: No Tendon: No Muscle: No Muscle: No Joint: No Joint: No Bone: No Bone: No Epithelialization: None Small (1-33%) Small (1-33%) Periwound Skin Texture: No Abnormalities Noted Excoriation: Yes Excoriation: Yes Induration:  No Induration: No Callus: No Callus: No Crepitus: No Crepitus: No Rash: No Rash: No Scarring: No Scarring: No Periwound Skin Maceration: Yes Maceration: No Maceration: Yes Moisture: Dry/Scaly: No Dry/Scaly: No Periwound Skin Color: No Abnormalities Noted Atrophie Blanche: No Atrophie Blanche: No Cyanosis: No Cyanosis: No Ecchymosis: No Ecchymosis: No Erythema: No Erythema: No Hemosiderin Staining: No Hemosiderin Staining: No Mottled: No Mottled: No Charo, Jarrett E. ([id number]) Pallor: No Pallor: No Rubor: No Rubor: No Temperature: No Abnormality No Abnormality No Abnormality Tenderness on No No No Palpation: Wound Preparation: Ulcer Cleansing: Ulcer Cleansing: Ulcer Cleansing: Rinsed/Irrigated with Rinsed/Irrigated with Rinsed/Irrigated with Saline Saline Saline Topical Anesthetic Topical Anesthetic Topical Anesthetic Applied: Other: lidocaine Applied: Other: lidocaine Applied: Other: lidocaine 4% 4% 4% Wound Number: 7 N/A N/A Photos: No Photos N/A N/A Wound Location: Left Toe Third N/A N/A Wounding Event: Gradually Appeared N/A N/A Primary Etiology: Diabetic Wound/Ulcer of N/A N/A the Lower Extremity Secondary Etiology: Pressure Ulcer N/A N/A Comorbid History: Anemia, Lymphedema, N/A N/A Congestive Heart Failure, Hypertension, Peripheral Venous Disease, Type II Diabetes, Neuropathy Date Acquired: 10/14/2016 N/A N/A Weeks of Treatment: 3 N/A N/A Wound Status: Open N/A N/A Pending Amputation on No N/A N/A Presentation: Measurements L x W x D 0.4x0.3x0.1 N/A N/A (cm) Area (cm) : 0.094 N/A N/A Volume (cm) : 0.009 N/A N/A % Reduction in Area: 93.90% N/A N/A % Reduction in Volume: 94.10% N/A N/A Classification: Grade 1 N/A N/A Exudate Amount: Large N/A N/A Exudate Type: Serous N/A N/A Exudate Color: amber N/A N/A Foul Odor After Yes N/A N/A Cleansing: Odor Anticipated Due to No N/A N/A Product Use: Wound Margin: Flat and Intact N/A  N/A Granulation Amount: Large (67-100%) N/A N/A Granulation Quality: Red N/A N/A Necrotic Amount: Small (1-33%) N/A N/A Exposed Structures: N/A N/A Calvi, Aycen E. ([id number]) Fat Layer (Subcutaneous Tissue) Exposed: Yes Fascia: No Tendon: No Muscle: No Joint: No Bone: No Epithelialization: None N/A N/A Periwound Skin Texture: Excoriation: Yes N/A N/A Induration: No Callus: No Crepitus: No Rash: No Scarring: No Periwound Skin Maceration: No N/A N/A Moisture: Dry/Scaly: No Periwound Skin Color: Atrophie Blanche: No N/A N/A Cyanosis: No Ecchymosis: No Erythema: No Hemosiderin Staining: No Mottled: No Pallor: No Rubor: No Temperature: N/A N/A N/A Tenderness on No N/A N/A Palpation: Wound Preparation: Ulcer Cleansing: N/A N/A Rinsed/Irrigated with Saline Topical Anesthetic Applied: Other: lidocaine 4% Treatment Notes Electronic Signature(s) Signed: 11/05/2016 11:56:43 AM By: Christin Fudge MD, FACS Entered By: Christin Fudge on 11/05/2016 11:56:42 Geno, EZRAH PANNING ([id number]) -------------------------------------------------------------------------------- [place] Details Patient Name: [name], [name] 11/05/2016 11:00 Date of Service: AM Medical Record [id number] Number: Patient Account Number: [id number] Date of Birth/Sex: 1950/05/20 (66 y.o. Male) Treating RN: Elyn Peers, Other Clinician: Primary Care Minka Knight: Olivia Mackie Treating Britto, Cherie Ouch, Ariv Penrod/Extender: Referring Pama Roskos: Harless Nakayama in Treatment: 6 Active Inactive ` Abuse / Safety / Falls / Self Care Management Nursing Diagnoses: Potential for falls Goals: Patient will remain injury free related to falls Date Initiated: 09/21/2016 Target Resolution Date: 11/30/2016 Goal Status: Active Interventions: Assess fall risk on admission and as needed Notes: ` Nutrition  Nursing Diagnoses: Potential for alteratiion in Nutrition/Potential for  imbalanced nutrition Goals: Patient/caregiver agrees to and verbalizes understanding of need to use nutritional supplements and/or vitamins as prescribed Date Initiated: 09/21/2016 Target Resolution Date: 11/30/2016 Goal Status: Active Interventions: Assess patient nutrition upon admission and as needed per policy Notes: ` Orientation to the [facility]. ([id number]) Nursing Diagnoses: Knowledge deficit related to the wound healing center program Goals: Patient/caregiver will verbalize understanding of the [facility] [place] Date Initiated: 09/21/2016 Target Resolution Date: 11/30/2016 Goal Status: Active Interventions: Provide education on orientation to the wound center Notes: ` Wound/Skin Impairment Nursing Diagnoses: Knowledge deficit related to smoking impact on wound healing Goals: Ulcer/skin breakdown will have a volume reduction of 30% by week 4 Date Initiated: 09/21/2016 Target Resolution Date: 11/30/2016 Goal Status: Active Ulcer/skin breakdown will have a volume reduction of 50% by week 8 Date Initiated: 09/21/2016 Target Resolution Date: 11/30/2016 Goal Status: Active Ulcer/skin breakdown will have a volume reduction of 80% by week 12 Date Initiated: 09/21/2016 Target Resolution Date: 11/30/2016 Goal Status: Active Ulcer/skin breakdown will heal within 14 weeks Date Initiated: 09/21/2016 Target Resolution Date: 11/30/2016 Goal Status: Active Interventions: Assess patient/caregiver ability to obtain necessary supplies Assess patient/caregiver ability to perform ulcer/skin care regimen upon admission and as needed Assess ulceration(s) every visit Notes: Electronic Signature(s) Signed: 11/05/2016 4:55:34 PM By: Montey Hora Entered By: Montey Hora on 11/05/2016 11:36:00 Culton, [name] ([id number]) -------------------------------------------------------------------------------- Pain Assessment Details Patient Name: [name], [name]  11/05/2016 11:00 Date of Service: AM Medical Record [id number] Number: Patient Account Number: [id number] Date of Birth/Sex: June 20, 1950 (66 y.o. Male) Treating RN: Elyn Peers, Other Clinician: Primary Care Dayana Dalporto: Olivia Mackie Treating Britto, Cherie Ouch, Skai Lickteig/Extender: Referring Tomeko Scoville: Harless Nakayama in Treatment: 6 Active Problems Location of Pain Severity and Description of Pain Patient Has Paino No Site Locations Pain Management and Medication Current Pain Management: Notes Topical or injectable lidocaine is offered to patient for acute pain when surgical debridement is performed. If needed, Patient is instructed to use over the counter pain medication for the following 24-48 hours after debridement. Wound care MDs do not prescribed pain medications. Patient has chronic pain or uncontrolled pain. Patient has been instructed to make an appointment with their Primary Care Physician for pain management. Electronic Signature(s) Signed: 11/05/2016 4:55:34 PM By: Montey Hora Entered By: Montey Hora on 11/05/2016 11:16:13 Sirianni, [name] ([id number]) -------------------------------------------------------------------------------- Patient/Caregiver Education Details Patient Name: [name], [name] 11/05/2016 11:00 Date of Service: AM Medical Record [id number] Number: Patient Account Number: [id number] Date of Birth/Gender: February 19, 1951 (66 y.o. Male) Treating RN: Montey Hora Primary Care MCLEAN-SCOCOZZA, Other Clinician: Physician: Olivia Mackie Treating Britto, Cherie Ouch, Physician/Extender: Referring Physician: Harless Nakayama in Treatment: 6 Education Assessment Education Provided To: Patient and Caregiver Education Topics Provided Wound/Skin Impairment: Handouts: Other: wound care as ordered Methods: Demonstration, Explain/Verbal Responses: State content correctly Electronic Signature(s) Signed: 11/05/2016 4:55:34 PM By: Montey Hora Entered By: Montey Hora on 11/05/2016 11:16:49 Carter, [name] ([id number]) -------------------------------------------------------------------------------- Wound Assessment Details Patient Name: [name], [name] 11/05/2016 11:00 Date of Service: AM Medical Record [id number] Number: Patient Account Number: [id number] Date of Birth/Sex: 1951-01-10 (66 y.o. Male) Treating RN: Elyn Peers, Other Clinician: Primary Care Macaiah Mangal: Olivia Mackie Treating Britto, Cherie Ouch, Dvontae Ruan/Extender: Referring Heraclio Seidman: Harless Nakayama in Treatment: 6 Wound Status Wound Number: 1 Primary Diabetic Wound/Ulcer of the Lower Etiology: Extremity Wound Location: Right Toe Great Wound Open Wounding Event: Gradually Appeared Status: Date Acquired: 06/11/2016 Comorbid Anemia, Lymphedema, Congestive Weeks Of Treatment: 6  History: Heart Failure, Hypertension, Peripheral Clustered Wound: No Venous Disease, Type II Diabetes, Pending Amputation On Presentation Neuropathy Photos Photo Uploaded By: Montey Hora on 11/05/2016 12:03:07 Wound Measurements Length: (cm) 0.9 Width: (cm) 3.1 Depth: (cm) 0.1 Area: (cm) 2.191 Volume: (cm) 0.219 % Reduction in Area: 95.4% % Reduction in Volume: 95.4% Epithelialization: Large (67-100%) Tunneling: No Undermining: No Wound Description Classification: Grade 1 Foul Odor After Wound Margin: Flat and Intact Due to Product Exudate Amount: Large Slough/Fibrino Exudate Type: Serosanguineous Exudate Color: red, brown Cleansing: Yes Use: No Yes Wound Bed Cregger, Vergil E. ([id number]) Granulation Amount: Large (67-100%) Exposed Structure Granulation Quality: Pink Fascia Exposed: No Necrotic Amount: Small (1-33%) Fat Layer (Subcutaneous Tissue) Exposed: Yes Necrotic Quality: Adherent Slough Tendon Exposed: No Muscle Exposed: No Joint Exposed: No Bone Exposed: No Periwound Skin Texture Texture Color No Abnormalities Noted:  No No Abnormalities Noted: No Callus: No Atrophie Blanche: No Crepitus: No Cyanosis: No Excoriation: Yes Ecchymosis: No Induration: No Erythema: No Rash: No Hemosiderin Staining: No Scarring: No Mottled: No Pallor: No Moisture Rubor: No No Abnormalities Noted: No Dry / Scaly: No Temperature / Pain Maceration: Yes Temperature: No Abnormality Wound Preparation Ulcer Cleansing: Rinsed/Irrigated with Saline Topical Anesthetic Applied: Other: lidocaine 4%, Treatment Notes Wound #1 (Right Toe Great) 1. Cleansed with: Clean wound with Normal Saline Cleanse wound with antibacterial soap and water 2. Anesthetic Topical Lidocaine 4% cream to wound bed prior to debridement 3. Peri-wound Care: Antifungal cream 4. Dressing Applied: Aquacel Ag 5. Secondary Dressing Applied Dry Gauze Kerlix/Conform 7. Secured with Tape Notes netting Electronic Signature(s) RONTE, PARKER ([id number]) Signed: 11/05/2016 11:33:04 AM By: Montey Hora Entered By: Montey Hora on 11/05/2016 11:33:03 Easterday, [name] ([id number]) -------------------------------------------------------------------------------- Wound Assessment Details Patient Name: [name], [name] 11/05/2016 11:00 Date of Service: AM Medical Record [id number] Number: Patient Account Number: [id number] Date of Birth/Sex: 1950-09-21 (66 y.o. Male) Treating RN: Elyn Peers, Other Clinician: Primary Care Praneel Haisley: Olivia Mackie Treating Britto, Cherie Ouch, Laurin Paulo/Extender: Referring Pama Roskos: Harless Nakayama in Treatment: 6 Wound Status Wound Number: 2 Primary Diabetic Wound/Ulcer of the Lower Etiology: Extremity Wound Location: Right Toe Second Wound Open Wounding Event: Gradually Appeared Status: Date Acquired: 06/11/2016 Comorbid Anemia, Lymphedema, Congestive Weeks Of Treatment: 6 History: Heart Failure, Hypertension, Peripheral Clustered Wound: No Venous Disease, Type II Diabetes, Pending  Amputation On Presentation Neuropathy Photos Photo Uploaded By: Montey Hora on 11/05/2016 12:03:08 Wound Measurements Length: (cm) 0.6 Width: (cm) 0.9 Depth: (cm) 0.1 Area: (cm) 0.424 Volume: (cm) 0.042 % Reduction in Area: 86.2% % Reduction in Volume: 86.3% Epithelialization: Small (1-33%) Tunneling: No Undermining: No Wound Description Classification: Grade 1 Foul Odor After Wound Margin: Flat and Intact Due to Product Exudate Amount: Large Slough/Fibrino Exudate Type: Serosanguineous Exudate Color: red, brown Cleansing: Yes Use: No Yes Wound Bed Littler, Troyce E. ([id number]) Granulation Amount: Medium (34-66%) Exposed Structure Granulation Quality: Pink Fascia Exposed: No Necrotic Amount: Medium (34-66%) Fat Layer (Subcutaneous Tissue) Exposed: Yes Necrotic Quality: Adherent Slough Tendon Exposed: No Muscle Exposed: No Joint Exposed: No Bone Exposed: No Periwound Skin Texture Texture Color No Abnormalities Noted: No No Abnormalities Noted: No Callus: No Atrophie Blanche: No Crepitus: No Cyanosis: No Excoriation: Yes Ecchymosis: No Induration: No Erythema: No Rash: No Hemosiderin Staining: No Scarring: No Mottled: No Pallor: No Moisture Rubor: No No Abnormalities Noted: No Dry / Scaly: No Temperature / Pain Maceration: Yes Temperature: No Abnormality Wound Preparation Ulcer Cleansing: Rinsed/Irrigated with Saline Topical Anesthetic Applied: Other: lidocaine 4%, Treatment Notes Wound #2 (Right Toe Second) 1.  Cleansed with: Clean wound with Normal Saline Cleanse wound with antibacterial soap and water 2. Anesthetic Topical Lidocaine 4% cream to wound bed prior to debridement 3. Peri-wound Care: Antifungal cream 4. Dressing Applied: Aquacel Ag 5. Secondary Dressing Applied Dry Gauze Kerlix/Conform 7. Secured with Tape Notes netting Electronic Signature(s) IMRI, LOR ([id number]) Signed: 11/05/2016 11:33:33 AM By: Montey Hora Entered By: Montey Hora on 11/05/2016 11:33:32 Brostrom, [name] ([id number]) -------------------------------------------------------------------------------- Wound Assessment Details Patient Name: [name], CABELLO 11/05/2016 11:00 Date of Service: AM Medical Record [id number] Number: Patient Account Number: [id number] Date of Birth/Sex: 17-Oct-1950 (66 y.o. Male) Treating RN: Elyn Peers, Other Clinician: Primary Care Lucely Leard: Olivia Mackie Treating Britto, Cherie Ouch, Ladaisha Portillo/Extender: Referring Lakendra Helling: Harless Nakayama in Treatment: 6 Wound Status Wound Number: 3 Primary Diabetic Wound/Ulcer of the Lower Etiology: Extremity Wound Location: Right Toe Third Wound Open Wounding Event: Gradually Appeared Status: Date Acquired: 06/11/2016 Comorbid Anemia, Lymphedema, Congestive Weeks Of Treatment: 6 History: Heart Failure, Hypertension, Peripheral Clustered Wound: No Venous Disease, Type II Diabetes, Pending Amputation On Presentation Neuropathy Photos Photo Uploaded By: Montey Hora on 11/05/2016 12:03:41 Wound Measurements Length: (cm) 0.3 Width: (cm) 0.5 Depth: (cm) 0.1 Area: (cm) 0.118 Volume: (cm) 0.012 % Reduction in Area: 76.5% % Reduction in Volume: 76% Epithelialization: Small (1-33%) Tunneling: No Undermining: No Wound Description Classification: Grade 1 Foul Odor After Wound Margin: Flat and Intact Due to Product Exudate Amount: Large Slough/Fibrino Exudate Type: Serous Exudate Color: amber Cleansing: Yes Use: No Yes Wound Bed Beidleman, Lajuane E. ([id number]) Granulation Amount: Large (67-100%) Exposed Structure Granulation Quality: Pink Fascia Exposed: No Necrotic Amount: Small (1-33%) Fat Layer (Subcutaneous Tissue) Exposed: Yes Necrotic Quality: Adherent Slough Tendon Exposed: No Muscle Exposed: No Joint Exposed: No Bone Exposed: No Periwound Skin Texture Texture Color No Abnormalities Noted: No No  Abnormalities Noted: No Callus: No Atrophie Blanche: No Crepitus: No Cyanosis: No Excoriation: Yes Ecchymosis: No Induration: No Erythema: No Rash: No Hemosiderin Staining: No Scarring: No Mottled: No Pallor: No Moisture Rubor: No No Abnormalities Noted: No Dry / Scaly: No Temperature / Pain Maceration: No Temperature: No Abnormality Wound Preparation Ulcer Cleansing: Rinsed/Irrigated with Saline Topical Anesthetic Applied: Other: lidocaine 4%, Treatment Notes Wound #3 (Right Toe Third) 1. Cleansed with: Clean wound with Normal Saline Cleanse wound with antibacterial soap and water 2. Anesthetic Topical Lidocaine 4% cream to wound bed prior to debridement 3. Peri-wound Care: Antifungal cream 4. Dressing Applied: Aquacel Ag 5. Secondary Dressing Applied Dry Gauze Kerlix/Conform 7. Secured with Tape Notes netting Electronic Signature(s) SUBHAN, HOOPES ([id number]) Signed: 11/05/2016 11:33:51 AM By: Montey Hora Entered By: Montey Hora on 11/05/2016 11:33:50 Main, [name] ([id number]) -------------------------------------------------------------------------------- Wound Assessment Details Patient Name: SEKOU, ZUCKERMAN 11/05/2016 11:00 Date of Service: AM Medical Record [id number] Number: Patient Account Number: [id number] Date of Birth/Sex: [birth year] (66 y.o. Male) Treating RN: Elyn Peers, Other Clinician: Primary Care Kammi Hechler: Olivia Mackie Treating Britto, Cherie Ouch, Laelle Bridgett/Extender: Referring Aland Chestnutt: Harless Nakayama in Treatment: 6 Wound Status Wound Number: 4 Primary Diabetic Wound/Ulcer of the Lower Etiology: Extremity Wound Location: Right Foot - Dorsal Wound Open Wounding Event: Gradually Appeared Status: Date Acquired: 06/11/2016 Comorbid Anemia, Lymphedema, Congestive Weeks Of Treatment: 6 History: Heart Failure, Hypertension, Peripheral Clustered Wound: No Venous Disease, Type II Diabetes, Pending  Amputation On Presentation Neuropathy Photos Photo Uploaded By: Montey Hora on 11/05/2016 12:03:42 Wound Measurements Length: (cm) 0.3 Width: (cm) 0.4 Depth: (cm) 0.1 Area: (cm) 0.094 Volume: (cm) 0.009 % Reduction in Area: 97.9% % Reduction in Volume: 98% Epithelialization:  None Tunneling: No Undermining: No Wound Description Classification: Grade 1 Foul Odor After Wound Margin: Distinct, outline attached Due to Product Exudate Amount: Large Exudate Type: Serous Exudate Color: amber Cleansing: Yes Use: No Wound Bed Beutler, Churchill E. ([id number]) Granulation Amount: None Present (0%) Necrotic Amount: Large (67-100%) Necrotic Quality: Adherent Slough Periwound Skin Texture Texture Color No Abnormalities Noted: No No Abnormalities Noted: No Moisture Temperature / Pain No Abnormalities Noted: No Temperature: No Abnormality Maceration: Yes Wound Preparation Ulcer Cleansing: Rinsed/Irrigated with Saline Topical Anesthetic Applied: Other: lidocaine 4%, Treatment Notes Wound #4 (Right, Dorsal Foot) 1. Cleansed with: Clean wound with Normal Saline Cleanse wound with antibacterial soap and water 2. Anesthetic Topical Lidocaine 4% cream to wound bed prior to debridement 3. Peri-wound Care: Antifungal cream 4. Dressing Applied: Aquacel Ag 5. Secondary Dressing Applied Dry Gauze Kerlix/Conform 7. Secured with Tape Notes netting Electronic Signature(s) Signed: 11/05/2016 4:55:34 PM By: Montey Hora Entered By: Montey Hora on 11/05/2016 11:35:27 Pollan, [name] ([id number]) -------------------------------------------------------------------------------- Wound Assessment Details Patient Name: YVETTE, ROARK 11/05/2016 11:00 Date of Service: AM Medical Record [id number] Number: Patient Account Number: [id number] Date of Birth/Sex: 08/10/1950 (67 y.o. Male) Treating RN: Elyn Peers, Other Clinician: Primary Care Claretha Townshend: Olivia Mackie  Treating Britto, Cherie Ouch, Adelaido Nicklaus/Extender: Referring Caitlynn Ju: Harless Nakayama in Treatment: 6 Wound Status Wound Number: 5 Primary Diabetic Wound/Ulcer of the Lower Etiology: Extremity Wound Location: Left Toe Great Wound Open Wounding Event: Gradually Appeared Status: Date Acquired: 06/11/2016 Comorbid Anemia, Lymphedema, Congestive Weeks Of Treatment: 6 History: Heart Failure, Hypertension, Peripheral Clustered Wound: No Venous Disease, Type II Diabetes, Pending Amputation On Presentation Neuropathy Photos Photo Uploaded By: Montey Hora on 11/05/2016 12:04:15 Wound Measurements Length: (cm) 3.1 Width: (cm) 0.6 Depth: (cm) 0.1 Area: (cm) 1.461 Volume: (cm) 0.146 % Reduction in Area: 89.8% % Reduction in Volume: 89.8% Epithelialization: Small (1-33%) Tunneling: No Undermining: No Wound Description Classification: Grade 1 Foul Odor After Wound Margin: Flat and Intact Due to Product Exudate Amount: Large Slough/Fibrino Exudate Type: Serous Exudate Color: amber Cleansing: Yes Use: No Yes Wound Bed Jaycox, Arben E. ([id number]) Granulation Amount: Large (67-100%) Exposed Structure Granulation Quality: Pink Fascia Exposed: No Necrotic Amount: Small (1-33%) Fat Layer (Subcutaneous Tissue) Exposed: Yes Necrotic Quality: Adherent Slough Tendon Exposed: No Muscle Exposed: No Joint Exposed: No Bone Exposed: No Periwound Skin Texture Texture Color No Abnormalities Noted: No No Abnormalities Noted: No Callus: No Atrophie Blanche: No Crepitus: No Cyanosis: No Excoriation: Yes Ecchymosis: No Induration: No Erythema: No Rash: No Hemosiderin Staining: No Scarring: No Mottled: No Pallor: No Moisture Rubor: No No Abnormalities Noted: No Dry / Scaly: No Temperature / Pain Maceration: No Temperature: No Abnormality Wound Preparation Ulcer Cleansing: Rinsed/Irrigated with Saline Topical Anesthetic Applied: Other: lidocaine 4%, Treatment  Notes Wound #5 (Left Toe Great) 1. Cleansed with: Clean wound with Normal Saline Cleanse wound with antibacterial soap and water 2. Anesthetic Topical Lidocaine 4% cream to wound bed prior to debridement 3. Peri-wound Care: Antifungal cream 4. Dressing Applied: Aquacel Ag 5. Secondary Dressing Applied Dry Gauze Kerlix/Conform 7. Secured with Tape Notes netting Electronic Signature(s) CARTRELL, BENTSEN ([id number]) Signed: 11/05/2016 11:34:08 AM By: Montey Hora Entered By: Montey Hora on 11/05/2016 11:34:07 Brazell, [name] ([id number]) -------------------------------------------------------------------------------- Wound Assessment Details Patient Name: DELSIN, COPEN 11/05/2016 11:00 Date of Service: AM Medical Record [id number] Number: Patient Account Number: [id number] Date of Birth/Sex: [birth year] (66 y.o. Male) Treating RN: Elyn Peers, Other Clinician: Primary Care Denell Cothern: Olivia Mackie Treating Britto, Cherie Ouch, Kristian Hazzard/Extender: Referring Jazzma Neidhardt: Harless Nakayama  in Treatment: 6 Wound Status Wound Number: 6 Primary Diabetic Wound/Ulcer of the Lower Etiology: Extremity Wound Location: Left Toe Second Wound Open Wounding Event: Gradually Appeared Status: Date Acquired: 06/11/2016 Comorbid Anemia, Lymphedema, Congestive Weeks Of Treatment: 6 History: Heart Failure, Hypertension, Peripheral Clustered Wound: No Venous Disease, Type II Diabetes, Pending Amputation On Presentation Neuropathy Photos Photo Uploaded By: Montey Hora on 11/05/2016 12:04:15 Wound Measurements Length: (cm) 0.7 Width: (cm) 0.6 Depth: (cm) 0.1 Area: (cm) 0.33 Volume: (cm) 0.033 % Reduction in Area: 84.6% % Reduction in Volume: 84.6% Epithelialization: Small (1-33%) Tunneling: No Undermining: No Wound Description Classification: Grade 1 Foul Odor After Wound Margin: Flat and Intact Due to Product Exudate Amount: Large Slough/Fibrino Exudate  Type: Serous Exudate Color: amber Cleansing: Yes Use: No Yes Wound Bed Bulthuis, Clee E. ([id number]) Granulation Amount: Large (67-100%) Exposed Structure Granulation Quality: Pink Fascia Exposed: No Necrotic Amount: Small (1-33%) Fat Layer (Subcutaneous Tissue) Exposed: Yes Necrotic Quality: Adherent Slough Tendon Exposed: No Muscle Exposed: No Joint Exposed: No Bone Exposed: No Periwound Skin Texture Texture Color No Abnormalities Noted: No No Abnormalities Noted: No Callus: No Atrophie Blanche: No Crepitus: No Cyanosis: No Excoriation: Yes Ecchymosis: No Induration: No Erythema: No Rash: No Hemosiderin Staining: No Scarring: No Mottled: No Pallor: No Moisture Rubor: No No Abnormalities Noted: No Dry / Scaly: No Temperature / Pain Maceration: Yes Temperature: No Abnormality Wound Preparation Ulcer Cleansing: Rinsed/Irrigated with Saline Topical Anesthetic Applied: Other: lidocaine 4%, Treatment Notes Wound #6 (Left Toe Second) 1. Cleansed with: Clean wound with Normal Saline Cleanse wound with antibacterial soap and water 2. Anesthetic Topical Lidocaine 4% cream to wound bed prior to debridement 3. Peri-wound Care: Antifungal cream 4. Dressing Applied: Aquacel Ag 5. Secondary Dressing Applied Dry Gauze Kerlix/Conform 7. Secured with Tape Notes netting Electronic Signature(s) TILMON, WISEHART ([id number]) Signed: 11/05/2016 11:34:25 AM By: Montey Hora Entered By: Montey Hora on 11/05/2016 11:34:24 Carrithers, [name] ([id number]) -------------------------------------------------------------------------------- Wound Assessment Details Patient Name: ISAIR, INABINET 11/05/2016 11:00 Date of Service: AM Medical Record [id number] Number: Patient Account Number: [id number] Date of Birth/Sex: 02/10/1951 (66 y.o. Male) Treating RN: Elyn Peers, Other Clinician: Primary Care Bali Lyn: Olivia Mackie Treating Britto, Cherie Ouch,  Daanish Copes/Extender: Referring Eryc Bodey: Harless Nakayama in Treatment: 6 Wound Status Wound Number: 7 Primary Diabetic Wound/Ulcer of the Lower Etiology: Extremity Wound Location: Left Toe Third Secondary Pressure Ulcer Wounding Event: Gradually Appeared Etiology: Date Acquired: 10/14/2016 Wound Open Weeks Of Treatment: 3 Status: Clustered Wound: No Comorbid Anemia, Lymphedema, Congestive History: Heart Failure, Hypertension, Peripheral Venous Disease, Type II Diabetes, Neuropathy Photos Photo Uploaded By: Montey Hora on 11/05/2016 12:04:47 Wound Measurements Length: (cm) 0.4 Width: (cm) 0.3 Depth: (cm) 0.1 Area: (cm) 0.094 Volume: (cm) 0.009 % Reduction in Area: 93.9% % Reduction in Volume: 94.1% Epithelialization: None Tunneling: No Undermining: No Wound Description Classification: Grade 1 Foul Odor After Wound Margin: Flat and Intact Due to Product Exudate Amount: Large Slough/Fibrino Exudate Type: Serous Exudate Color: amber Grizzell, Pratt E. ([id number]) Cleansing: Yes Use: No Yes Wound Bed Granulation Amount: Large (67-100%) Exposed Structure Granulation Quality: Red Fascia Exposed: No Necrotic Amount: Small (1-33%) Fat Layer (Subcutaneous Tissue) Exposed: Yes Necrotic Quality: Adherent Slough Tendon Exposed: No Muscle Exposed: No Joint Exposed: No Bone Exposed: No Periwound Skin Texture Texture Color No Abnormalities Noted: No No Abnormalities Noted: No Callus: No Atrophie Blanche: No Crepitus: No Cyanosis: No Excoriation: Yes Ecchymosis: No Induration: No Erythema: No Rash: No Hemosiderin Staining: No Scarring: No Mottled: No Pallor: No Moisture Rubor: No No  Abnormalities Noted: No Dry / Scaly: No Maceration: No Wound Preparation Ulcer Cleansing: Rinsed/Irrigated with Saline Topical Anesthetic Applied: Other: lidocaine 4%, Treatment Notes Wound #7 (Left Toe Third) 1. Cleansed with: Clean wound with Normal Saline Cleanse wound  with antibacterial soap and water 2. Anesthetic Topical Lidocaine 4% cream to wound bed prior to debridement 3. Peri-wound Care: Antifungal cream 4. Dressing Applied: Aquacel Ag 5. Secondary Dressing Applied Dry Gauze Kerlix/Conform 7. Secured with Tape Notes netting KIERAN, ARREGUIN ([id number]) Electronic Signature(s) Signed: 11/05/2016 4:55:34 PM By: Montey Hora Entered By: Montey Hora on 11/05/2016 11:35:02 Footman, [name] ([id number]) -------------------------------------------------------------------------------- Vitals Details Patient Name: JAMIERE, GULAS 11/05/2016 11:00 Date of Service: AM Medical Record [id number] Number: Patient Account Number: [id number] Date of Birth/Sex: 1951/03/06 (66 y.o. Male) Treating RN: Elyn Peers, Other Clinician: Primary Care Shanon Becvar: Olivia Mackie Treating Britto, Cherie Ouch, Shawntina Diffee/Extender: Referring Alyiah Ulloa: Harless Nakayama in Treatment: 6 Vital Signs Time Taken: 11:18 Temperature (F): 98.4 Height (in): 69 Pulse (bpm): 78 Weight (lbs): 168 Respiratory Rate (breaths/min): 16 Body Mass Index (BMI): 24.8 Blood Pressure (mmHg): 152/73 Reference Range: 80 - 120 mg / dl Electronic Signature(s) Signed: 11/05/2016 4:55:34 PM By: Montey Hora Entered By: Montey Hora on 11/05/2016 11:18:48

## 2016-11-09 ENCOUNTER — Other Ambulatory Visit: Payer: Self-pay | Admitting: Internal Medicine

## 2016-11-09 DIAGNOSIS — J392 Other diseases of pharynx: Secondary | ICD-10-CM

## 2016-11-09 DIAGNOSIS — N183 Chronic kidney disease, stage 3 unspecified: Secondary | ICD-10-CM

## 2016-11-09 DIAGNOSIS — B182 Chronic viral hepatitis C: Secondary | ICD-10-CM | POA: Insufficient documentation

## 2016-11-09 DIAGNOSIS — D120 Benign neoplasm of cecum: Secondary | ICD-10-CM

## 2016-11-09 DIAGNOSIS — K635 Polyp of colon: Secondary | ICD-10-CM

## 2016-11-09 DIAGNOSIS — K7460 Unspecified cirrhosis of liver: Secondary | ICD-10-CM | POA: Insufficient documentation

## 2016-11-12 ENCOUNTER — Encounter (INDEPENDENT_AMBULATORY_CARE_PROVIDER_SITE_OTHER): Payer: Self-pay | Admitting: Vascular Surgery

## 2016-11-12 ENCOUNTER — Encounter: Payer: Medicare HMO | Attending: Physician Assistant | Admitting: Physician Assistant

## 2016-11-12 ENCOUNTER — Encounter: Payer: Self-pay | Admitting: *Deleted

## 2016-11-12 ENCOUNTER — Ambulatory Visit (INDEPENDENT_AMBULATORY_CARE_PROVIDER_SITE_OTHER): Payer: Medicare HMO | Admitting: Vascular Surgery

## 2016-11-12 VITALS — BP 143/84 | HR 75 | Resp 16 | Wt 151.0 lb

## 2016-11-12 DIAGNOSIS — I8311 Varicose veins of right lower extremity with inflammation: Secondary | ICD-10-CM

## 2016-11-12 DIAGNOSIS — I509 Heart failure, unspecified: Secondary | ICD-10-CM | POA: Diagnosis not present

## 2016-11-12 DIAGNOSIS — F17218 Nicotine dependence, cigarettes, with other nicotine-induced disorders: Secondary | ICD-10-CM | POA: Insufficient documentation

## 2016-11-12 DIAGNOSIS — I110 Hypertensive heart disease with heart failure: Secondary | ICD-10-CM | POA: Diagnosis not present

## 2016-11-12 DIAGNOSIS — L97512 Non-pressure chronic ulcer of other part of right foot with fat layer exposed: Secondary | ICD-10-CM | POA: Insufficient documentation

## 2016-11-12 DIAGNOSIS — Z9119 Patient's noncompliance with other medical treatment and regimen: Secondary | ICD-10-CM | POA: Diagnosis not present

## 2016-11-12 DIAGNOSIS — I10 Essential (primary) hypertension: Secondary | ICD-10-CM

## 2016-11-12 DIAGNOSIS — Z85828 Personal history of other malignant neoplasm of skin: Secondary | ICD-10-CM | POA: Insufficient documentation

## 2016-11-12 DIAGNOSIS — D649 Anemia, unspecified: Secondary | ICD-10-CM | POA: Insufficient documentation

## 2016-11-12 DIAGNOSIS — J449 Chronic obstructive pulmonary disease, unspecified: Secondary | ICD-10-CM | POA: Insufficient documentation

## 2016-11-12 DIAGNOSIS — E11621 Type 2 diabetes mellitus with foot ulcer: Secondary | ICD-10-CM | POA: Diagnosis not present

## 2016-11-12 DIAGNOSIS — Z7984 Long term (current) use of oral hypoglycemic drugs: Secondary | ICD-10-CM | POA: Diagnosis not present

## 2016-11-12 DIAGNOSIS — I890 Lymphedema, not elsewhere classified: Secondary | ICD-10-CM | POA: Diagnosis not present

## 2016-11-12 DIAGNOSIS — I872 Venous insufficiency (chronic) (peripheral): Secondary | ICD-10-CM | POA: Diagnosis not present

## 2016-11-12 DIAGNOSIS — E1140 Type 2 diabetes mellitus with diabetic neuropathy, unspecified: Secondary | ICD-10-CM | POA: Diagnosis not present

## 2016-11-12 DIAGNOSIS — I8312 Varicose veins of left lower extremity with inflammation: Secondary | ICD-10-CM | POA: Diagnosis not present

## 2016-11-12 DIAGNOSIS — I87313 Chronic venous hypertension (idiopathic) with ulcer of bilateral lower extremity: Secondary | ICD-10-CM | POA: Insufficient documentation

## 2016-11-12 DIAGNOSIS — Z86718 Personal history of other venous thrombosis and embolism: Secondary | ICD-10-CM | POA: Insufficient documentation

## 2016-11-12 DIAGNOSIS — E118 Type 2 diabetes mellitus with unspecified complications: Secondary | ICD-10-CM | POA: Diagnosis not present

## 2016-11-12 DIAGNOSIS — L97522 Non-pressure chronic ulcer of other part of left foot with fat layer exposed: Secondary | ICD-10-CM | POA: Insufficient documentation

## 2016-11-12 DIAGNOSIS — Z923 Personal history of irradiation: Secondary | ICD-10-CM | POA: Diagnosis not present

## 2016-11-12 DIAGNOSIS — F1019 Alcohol abuse with unspecified alcohol-induced disorder: Secondary | ICD-10-CM | POA: Insufficient documentation

## 2016-11-12 NOTE — Progress Notes (Signed)
MRN : [id number]  [name] is a 66 y.o. (Jun 16, 1950) male who presents with chief complaint of  Chief Complaint  Patient presents with  . Contact Lens Follow-up    3-4 week post laser  .  History of Present Illness: The patient returns to the office for followup evaluation regarding leg swelling.  The swelling has improved quite a bit and the pain associated with swelling has decreased substantially. There have not been any interval development of a ulcerations or wounds.  Since the previous visit the patient has been wearing graduated compression stockings and has noted little significant improvement in the lymphedema. The patient has been using compression routinely morning until night.  The patient also states elevation during the day and exercise is being done too.     Current Meds  Medication Sig  . amLODipine (NORVASC) 10 MG tablet Take by mouth.  Marland Kitchen aspirin EC 81 MG tablet Take 81 mg by mouth daily.  Marland Kitchen atorvastatin (LIPITOR) 40 MG tablet Take 40 mg by mouth at bedtime.  . chlorthalidone (HYGROTON) 25 MG tablet Take by mouth.  . doxycycline (VIBRA-TABS) 100 MG tablet Take by mouth.  . ferrous sulfate 325 (65 FE) MG tablet Take by mouth.  Marland Kitchen HYDROcodone-acetaminophen (NORCO) 5-325 MG tablet Take 1 tablet by mouth every 6 (six) hours as needed for moderate pain.  . hydrOXYzine (ATARAX/VISTARIL) 25 MG tablet Take 25 mg by mouth 3 times daily as needed.  . isosorbide mononitrate (IMDUR) 30 MG 24 hr tablet Take 30 mg by mouth daily.   Marland Kitchen lisinopril (PRINIVIL,ZESTRIL) 40 MG tablet Take 40 mg by mouth once daily.  Marland Kitchen omeprazole (PRILOSEC) 40 MG capsule Take by mouth.  . polyethylene glycol (MIRALAX / GLYCOLAX) packet Take by mouth.  . silver sulfADIAZINE (SILVADENE) 1 % cream Apply 1 application topically 2 (two) times daily.  Marland Kitchen spironolactone (ALDACTONE) 25 MG tablet Take 25 mg by mouth daily.   Marland Kitchen torsemide (DEMADEX) 20 MG tablet Take 20 mg by mouth daily.    Current  Facility-Administered Medications for the 11/12/16 encounter (Office Visit) with Delana Meyer, Dolores Lory, MD  Medication  . albumin human 25 % solution 50 g  . albumin human 25 % solution 50 g    Past Medical History:  Diagnosis Date  . CHF (congestive heart failure) ([place])   . Diabetes mellitus    Patient take Metformin.  Marland Kitchen GERD (gastroesophageal reflux disease)   . Hypertension   . Leg swelling   . Loose, teeth    PATIENT STATES "HAS 6 TEETH, SOME ARE LOOSE"  . Neuromuscular disorder ([place])    RIGHT HAND AND FINGERS NUMB  . Shortness of breath dyspnea   . 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 APPENDECTOMY    . COLONOSCOPY WITH PROPOFOL N/A 11/19/2014   Procedure: COLONOSCOPY WITH PROPOFOL;  Surgeon: Lucilla Lame, MD;  Location: [place];  Service: Endoscopy;  Laterality: N/A;  DIABETIC-ORAL MEDS  . COLONOSCOPY WITH PROPOFOL N/A 10/04/2016   Procedure: COLONOSCOPY WITH PROPOFOL;  Surgeon: Jonathon Bellows, MD;  Location[facility] ENDOSCOPY;  Service: Endoscopy;  Laterality: N/A;  . ESOPHAGOGASTRODUODENOSCOPY (EGD) WITH PROPOFOL N/A 10/04/2016   Procedure: ESOPHAGOGASTRODUODENOSCOPY (EGD) WITH PROPOFOL;  Surgeon: Jonathon Bellows, MD;  Location[facility] ENDOSCOPY;  Service: Endoscopy;  Laterality: N/A;  . INGUINAL LYMPH NODE BIOPSY Right 01/26/2016   Procedure: INGUINAL LYMPH NODE BIOPSY;  Surgeon: Clayburn Pert, MD;  Location: [facility]  ORS;  Service: General;  Laterality: Right;  . RECTAL BIOPSY N/A 11/02/2014   Procedure: BIOPSY RECTAL;  Surgeon: Marlyce Huge, MD;  Location: [facility] ORS;  Service: General;  Laterality: N/A;  . RECTAL EXAM UNDER ANESTHESIA N/A 11/02/2014   Procedure: RECTAL EXAM UNDER ANESTHESIA;  Surgeon: Marlyce Huge, MD;  Location: [facility] ORS;  Service: General;  Laterality: N/A;  . TONSILLECTOMY      Social History Social History  Substance Use Topics  . Smoking status: Current Every Day Smoker     Packs/day: 0.25    Years: 20.00    Types: Cigarettes  . Smokeless tobacco: Never Used     Comment: pt claims no smoking x 3 d  . Alcohol use No     Comment: per pt used to drink beer daily- quit may/2018    Family History Family History  Problem Relation Age of Onset  . Asthma Mother     No Known Allergies   REVIEW OF SYSTEMS (Negative unless checked)  Constitutional: [] Weight loss  [] Fever  [] Chills Cardiac: [] Chest pain   [] Chest pressure   [] Palpitations   [] Shortness of breath when laying flat   [] Shortness of breath with exertion. Vascular:  [] Pain in legs with walking   [x] Pain in legs with standing  [] History of DVT   [] Phlebitis   [x] Swelling in legs   [] Varicose veins   [] Non-healing ulcers Pulmonary:   [] Uses home oxygen   [] Productive cough   [] Hemoptysis   [] Wheeze  [x] COPD   [] Asthma Neurologic:  [] Dizziness   [] Seizures   [] History of stroke   [] History of TIA  [] Aphasia   [] Vissual changes   [] Weakness or numbness in arm   [] Weakness or numbness in leg Musculoskeletal:   [] Joint swelling   [] Joint pain   [x] Low back pain Hematologic:  [] Easy bruising  [] Easy bleeding   [] Hypercoagulable state   [] Anemic Gastrointestinal:  [] Diarrhea   [] Vomiting  [] Gastroesophageal reflux/heartburn   [] Difficulty swallowing. Genitourinary:  [] Chronic kidney disease   [] Difficult urination  [] Frequent urination   [] Blood in urine Skin:  [] Rashes   [] Ulcers  Psychological:  [] History of anxiety   []  History of major depression.  Physical Examination  Vitals:   11/12/16 1049  BP: (!) 143/84  Pulse: 75  Resp: 16  Weight: 151 lb (68.5 kg)   Body mass index is 21.98 kg/m. Gen: WD/WN, NAD Head: [place]/AT, No temporalis wasting.  Ear/Nose/Throat: Hearing grossly intact, nares w/o erythema or drainage Eyes: PER, EOMI, sclera nonicteric.  Neck: Supple, no large masses.   Pulmonary:  Good air movement, no audible wheezing bilaterally, no use of accessory muscles.  Cardiac: RRR, no  JVD Vascular:  Severe venous stasis changes to the legs bilaterally.  3+ soft pitting edema Vessel Right Left  Radial Palpable Palpable  PT Trace Palpable Trace Palpable  DP 1+ Palpable 1+ Palpable  Gastrointestinal: Non-distended. No guarding/no peritoneal signs.  Musculoskeletal: M/S 5/5 throughout.  No deformity or atrophy.  Neurologic: CN 2-12 intact. Symmetrical.  Speech is fluent. Motor exam as listed above. Psychiatric: Judgment intact, Mood & affect appropriate for pt's clinical situation. Dermatologic: severe venous rashes no ulcers noted.  No changes consistent with cellulitis. Lymph : No lichenification or skin changes of chronic lymphedema.  CBC Lab Results  Component Value Date   WBC 6.9 10/25/2016   HGB 8.0 (L) 10/25/2016   HCT 23.3 (L) 10/25/2016   MCV 89.3 10/25/2016   PLT 214 10/25/2016    BMET    Component  Value Date/Time   NA 129 (L) 09/18/2016 1100   NA 122 (L) 08/07/2014 1958   K 4.3 09/18/2016 1100   K 4.9 08/07/2014 1958   CL 98 (L) 09/18/2016 1100   CL 88 (L) 08/07/2014 1958   CO2 25 09/18/2016 1100   CO2 27 08/07/2014 1958   GLUCOSE 109 (H) 09/18/2016 1100   GLUCOSE 765 (HH) 08/07/2014 1958   BUN 21 (H) 09/18/2016 1100   BUN 19 08/07/2014 1958   CREATININE 1.63 (H) 09/18/2016 1100   CREATININE 1.31 (H) 08/07/2014 1958   CALCIUM 8.3 (L) 09/18/2016 1100   CALCIUM 9.0 08/07/2014 1958   GFRNONAA 43 (L) 09/18/2016 1100   GFRNONAA 58 (L) 08/07/2014 1958   GFRAA 50 (L) 09/18/2016 1100   GFRAA >60 08/07/2014 1958   CrCl cannot be calculated (Patient's most recent lab result is older than the maximum 21 days allowed.).  COAG Lab Results  Component Value Date   INR 0.98 08/27/2016   INR 1.15 11/01/2014   INR 1.1 09/08/2013    Radiology No results found.  Assessment/Plan 1. Chronic venous insufficiency  No surgery or intervention at this point in time.    I have reviewed my discussion with the patient regarding lymphedema and why it   causes symptoms.  Patient will continue wearing graduated compression stockings class 1 (20-30 mmHg) on a daily basis a prescription was given. The patient is reminded to put the stockings on first thing in the morning and removing them in the evening. The patient is instructed specifically not to sleep in the stockings.   In addition, behavioral modification throughout the day will be continued.  This will include frequent elevation (such as in a recliner), use of over the counter pain medications as needed and exercise such as walking.  I have reviewed systemic causes for chronic edema such as liver, kidney and cardiac etiologies and there does not appear to be any significant changes in these organ systems over the past year.  The patient is under the impression that these organ systems are all stable and unchanged.    The patient will continue aggressive use of the  lymph pump.  This will continue to improve the edema control and prevent sequela such as ulcers and infections.   The patient will follow-up with me on an annual basis.    2. Varicose veins of both lower extremities with inflammation No surgery or intervention at this point in time.    I have had a long discussion with the patient regarding venous insufficiency and why it  causes symptoms. I have discussed with the patient the chronic skin changes that accompany venous insufficiency and the long term sequela such as infection and ulceration.  Patient will begin wearing graduated compression stockings class 1 (20-30 mmHg) or compression wraps on a daily basis a prescription was given. The patient will put the stockings on first thing in the morning and removing them in the evening. The patient is instructed specifically not to sleep in the stockings.    In addition, behavioral modification including several periods of elevation of the lower extremities during the day will be continued. I have demonstrated that proper elevation is a  position with the ankles at heart level.  The patient is instructed to begin routine exercise, especially walking on a daily basis  Following the review of the ultrasound the patient will follow up in 2-3 months to reassess the degree of swelling and the control that graduated compression stockings  or compression wraps  is offering.   The patient can be assessed for a Lymph Pump at that time  3. Essential hypertension Continue antihypertensive medications as already ordered, these medications have been reviewed and there are no changes at this time.   4. Type 2 diabetes mellitus with complication, without long-term current use of insulin (HCC) Continue hypoglycemic medications as already ordered, these medications have been reviewed and there are no changes at this time.  Hgb A1C to be monitored as already arranged by primary service   Hortencia Pilar, MD  11/12/2016 8:13 PM

## 2016-11-13 ENCOUNTER — Ambulatory Visit
Admission: RE | Admit: 2016-11-13 | Discharge: 2016-11-13 | Disposition: A | Discharge status: Home / Self Care | Payer: Medicare HMO | Source: Ambulatory Visit | Attending: Gastroenterology | Admitting: Gastroenterology

## 2016-11-13 ENCOUNTER — Encounter
Admission: RE | Disposition: A | Discharge status: Home / Self Care | Payer: Self-pay | Source: Ambulatory Visit | Attending: Gastroenterology

## 2016-11-13 ENCOUNTER — Ambulatory Visit: Payer: Medicare HMO

## 2016-11-13 ENCOUNTER — Ambulatory Visit: Payer: Medicare HMO | Admitting: Radiation Oncology

## 2016-11-13 DIAGNOSIS — D509 Iron deficiency anemia, unspecified: Secondary | ICD-10-CM | POA: Diagnosis present

## 2016-11-13 DIAGNOSIS — K31819 Angiodysplasia of stomach and duodenum without bleeding: Secondary | ICD-10-CM | POA: Insufficient documentation

## 2016-11-13 HISTORY — PX: GIVENS CAPSULE STUDY: SHX5432

## 2016-11-13 SURGERY — IMAGING PROCEDURE, GI TRACT, INTRALUMINAL, VIA CAPSULE

## 2016-11-13 NOTE — Progress Notes (Signed)
CROY, DRUMWRIGHT ([id number]) Visit Report for 11/12/2016 Chief Complaint Document Details Patient Name: [name], [name] 11/12/2016 3:30 Date of Service: PM Medical Record [id number] Number: Patient Account Number: [id number] Date of Birth/Sex: 09-04-1950 (66 y.o. Male) Treating RN: Elyn Peers, Other Clinician: Primary Care Provider: Olivia Mackie Treating STONE III, MCLEAN-SCOCOZZA, Provider/Extender: Margarita Grizzle Referring Provider: Harless Nakayama in Treatment: 7 Information Obtained from: Patient Chief Complaint Patients presents for treatment of an open diabetic ulcer to both feet Electronic Signature(s) Signed: 11/12/2016 4:13:08 PM By: Worthy Keeler PA-C Entered By: Worthy Keeler on 11/12/2016 16:00:16 [name], [name] ([id number]) -------------------------------------------------------------------------------- HPI Details Patient Name: [name], [name] 11/12/2016 3:30 Date of Service: PM Medical Record [id number] Number: Patient Account Number: [id number] Date of Birth/Sex: 25-Apr-1950 (66 y.o. Male) Treating RN: Elyn Peers, Other Clinician: Primary Care Provider: Olivia Mackie Treating STONE III, MCLEAN-SCOCOZZA, Provider/Extender: HOYT Referring Provider: Harless Nakayama in Treatment: 7 History of Present Illness Location: bilateral feet ulceration on the toes Quality: Patient reports experiencing a dull pain to affected area(s). Severity: Patient states wound are getting better Duration: Patient has had the wound for > 3 months prior to seeking treatment at the wound center Timing: Pain in wound is constant (hurts all the time) Context: The wound would happen gradually Modifying Factors: Other treatment(s) tried include:treatment for lymphedema and is seen by the podiatrist Dr. Caryl Comes Associated Signs and Symptoms: Patient reports having increase swelling. HPI Description: 66 year old patient here to see as for bilateral feet ulceration to on his left first  and second toe and 2 on his right first and second toe, which she's had for about 4 months. 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 Donella Stade. The patient is also undergoing workup by medical oncology for a intra-abdominal lymphadenopathy. 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 He is also status post appendectomy, inguinal lymph node biopsy, rectal biopsy and rectal examination under anesthesia. he currently smokes cigarettes about half packet a day. In March of this year he was seen by Dr. Hortencia Pilar, for evaluation of bilateral varicose veins and besides wearing compression stockings he had recommended laser ablation of the right and left great saphenous veins to eleviate the symptoms and complications of severe superficial venous reflux disease. He also recommended lymphedema pumps for better control of his lymphedema. The patient recently has had on 08/23/2016, right greater saphenous vein ablation with the laser energy Earlier lower extremity venous reflux examination done on 05/08/2016 showed no DVT or SVT both lower legs but incompetence of bilateral great saphenous veins was present. A lower arterial study was also done and there was no significant right lower and left lower extremity problems based on a normal toe brachial index bilaterally and the ABI was 1.221 the left and 1.23 on the right. His post ablation venous duplex examination showed successful ablation of the right GS vein with thrombus formation 2 below the right saphenofemoral junction. The deep system was patent without evidence of thrombosis and this was done on 08/30/2016. the patient also has a squamous cell cancer of the skin of the buttock and is recently  undergone radiation therapy for this prior to excisional surgery. Addendum: regarding his x-rays done today and x-ray of the left foot -- IMPRESSION: No objective Lichty, Shaarav E. ([id number])  evidence of osteomyelitis. There are soft tissue changes which may reflect cellulitis. X-ray of the right foot -- IMPRESSION:Findings compatible with cellulitis of the toes. No objective evidence of osteomyelitis is observed. 10/01/16 on evaluation today patient's wounds appeared to be doing some better. I did review the x-rays as well which showed no evidence of osteomyelitis although there was evidence on x-ray of cellulitis. He fortunately is not having any discomfort although he continues to have some swelling. He does not remember being on any antibiotics recently. 10/15/16 on evaluation today patient's wounds overall appear to be doing better although he does have a new location noted on the left foot. Fortunately he is not having significant pain. It almost has the appearance that something is rubbing on the end of his toes but he wears the open toe shoes and according to what he is telling me never wears anything that would rub on his foot. There is no evidence of infection and specifically no evidence of a fungal infection 10/22/16 On evaluation today patient's wounds appeared to be doing better compared to last week in regard to his bilateral lower extremities. Fortunately I happy with how things are progressing although he still has ulcers I feel like that he is improving and appropriate manner. 11/12/16 on evaluation today patient appears to be doing well in regard to his bilateral feet and the respective wounds. We have been using surrounding her dressings along with an antifungal cream which seems to be doing very well. He has no bilateral dysfunction noticed that the rituals are weight loss at this point. He also has no nausea or vomiting a note purulent discharge. He did see Vein and vascular  today and he tells me that they told him he could have surgery for his venous stasis but they did not feel like it was worth it in his words. Fortunately patient's wounds do appear to be getting sneakily better. Electronic Signature(s) Signed: 11/12/2016 4:13:08 PM By: Worthy Keeler PA-C Entered By: Worthy Keeler on 11/12/2016 16:08:02 [name], [name] ([id number]) -------------------------------------------------------------------------------- Physical Exam Details Patient Name: [name], [name] 11/12/2016 3:30 Date of Service: PM Medical Record [id number] Number: Patient Account Number: [id number] Date of Birth/Sex: [birth year] (66 y.o. Male) Treating RN: Elyn Peers, Other Clinician: Primary Care Provider: TRACY Treating STONE III, MCLEAN-SCOCOZZA, Provider/Extender: HOYT Referring Provider: Harless Nakayama in Treatment: 7 Constitutional Well-nourished and well-hydrated in no acute distress. Respiratory normal breathing without difficulty. clear to auscultation bilaterally. Cardiovascular regular rate and rhythm with normal S1, S2. 2+ dorsalis pedis/posterior tibialis pulses. 1+ pitting edema of the bilateral lower extremities. Psychiatric this patient is able to make decisions and demonstrates good insight into disease process. Alert and Oriented x 3. pleasant and cooperative. Notes Patient's wound beds appear to be granular in regard to the left second toe wound slightly hyper granular but overall are doing well with no evidence of infection. No debridement required today. Electronic Signature(s) Signed: 11/12/2016 4:13:08 PM By: Worthy Keeler PA-C Entered By: Worthy Keeler on 11/12/2016 16:09:03 [name], [name] ([id number]) -------------------------------------------------------------------------------- Physician Orders Details Patient Name: [name], [name] 11/12/2016 3:30 Date of Service: PM Medical Record [id number] Number: Patient Account Number:  [id number] Date of Birth/Sex: October 12, 1950 (66 y.o. 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 Wound #2 Right Toe Second o Cleanse wound with mild soap and water o May Shower, gently pat wound dry prior to applying new dressing. Wound #3 Right Toe Third o Cleanse wound with mild soap and water o May Shower, gently pat wound dry prior to applying new dressing. Wound #4 Right,Dorsal Foot o Cleanse wound with mild soap and water o May Shower, gently pat wound dry prior to applying new dressing. Wound #5 Left Toe Great o Cleanse wound with mild soap and water o May Shower, gently pat wound dry prior to applying new dressing. Wound #6 Left Toe Second o Cleanse wound with mild soap and water [name], [name] ([id number]) o May Shower, gently pat wound dry prior to applying new dressing. Wound #7 Left Toe Third o Cleanse wound with mild soap and water o May Shower, gently pat wound dry prior to applying new dressing. Anesthetic Wound #1 Right Toe Great o Topical Lidocaine 4% cream applied to  wound bed prior to debridement Wound #2 Right Toe Second o Topical Lidocaine 4% cream applied to wound bed prior to debridement Wound #3 Right Toe Third o Topical Lidocaine 4% cream applied to wound bed prior to debridement Wound #4 Right,Dorsal Foot o Topical Lidocaine 4% cream applied to wound bed prior to debridement Wound #5 Left Toe Great o Topical Lidocaine 4% cream applied to wound bed prior to debridement Wound #6 Left Toe Second o Topical Lidocaine 4% cream applied to wound bed prior to debridement Wound #7 Left Toe Third o Topical Lidocaine 4% cream applied to wound bed prior to debridement Skin Barriers/Peri-Wound Care Wound #1 Right Toe Great o Antifungal cream - on and between toes then AqAg over areas Wound #2 Right Toe Second o Antifungal cream - on and between toes then AqAg over areas Wound #3 Right Toe Third o Antifungal cream - on and between toes then AqAg over areas Wound #4 Right,Dorsal Foot o Antifungal cream - on and between toes then AqAg over areas Wound #5 Left Toe Great o Antifungal cream - on and between toes then AqAg over areas Wound #6 Left Toe Second o Antifungal cream - on and between toes then AqAg over areas Wound #7 Left Toe Third o Antifungal cream - on and between toes then AqAg over areas [name], [name] ([id number]) Primary Wound Dressing Wound #1 Right Toe Great o Aquacel Ag Wound #2 Right Toe Second o Aquacel Ag Wound #3 Right Toe Third o Aquacel Ag Wound #4 Right,Dorsal Foot o Aquacel Ag Wound #5 Left Toe Great o Aquacel Ag Wound #6 Left Toe Second o Aquacel Ag Wound #7 Left Toe Third o Aquacel Ag Secondary Dressing Wound #1 Right Toe Great o ABD pad o Gauze and Kerlix/Conform Wound #2 Right Toe Second o ABD pad o Gauze and Kerlix/Conform Wound #3 Right Toe Third o ABD pad o Gauze and Kerlix/Conform Wound #4 Right,Dorsal Foot o ABD pad o Gauze and  Kerlix/Conform Wound #5 Left Toe Great o ABD pad o Gauze  and Kerlix/Conform Wound #6 Left Toe Second o ABD pad o Gauze and Kerlix/Conform Wound #7 Left Toe Third [name], [name] ([id number]) o ABD pad o Gauze and Kerlix/Conform Dressing Change Frequency Wound #1 Right Toe Great o Change dressing every day. Wound #2 Right Toe Second o Change dressing every day. Wound #3 Right Toe Third o Change dressing every day. Wound #4 Right,Dorsal Foot o Change dressing every day. Wound #5 Left Toe Great o Change dressing every day. Wound #6 Left Toe Second o Change dressing every day. Wound #7 Left Toe Third o Change dressing every day. Follow-up Appointments Wound #1 Right Toe Great o Return Appointment in 1 week. Wound #2 Right Toe Second o Return Appointment in 1 week. Wound #3 Right Toe Third o Return Appointment in 1 week. Wound #4 Right,Dorsal Foot o Return Appointment in 1 week. Wound #5 Left Toe Great o Return Appointment in 1 week. Wound #6 Left Toe Second o Return Appointment in 1 week. Wound #7 Left Toe Third o Return Appointment in 1 week. Edema Control [name], [name] ([id number]) Wound #1 Right Toe Great o Patient to wear own compression stockings o Elevate legs to the level of the heart and pump ankles as often as possible Wound #2 Right Toe Second o Patient to wear own compression stockings o Elevate legs to the level of the heart and pump ankles as often as possible Wound #3 Right Toe Third o Patient to wear own compression stockings o Elevate legs to the level of the heart and pump ankles as often as possible Wound #4 Right,Dorsal Foot o Patient to wear own compression stockings o Elevate legs to the level of the heart and pump ankles as often as possible Wound #5 Left Toe Great o Patient to wear own compression stockings o Elevate legs to the level of the heart and pump ankles as often as  possible Wound #6 Left Toe Second o Patient to wear own compression stockings o Elevate legs to the level of the heart and pump ankles as often as possible Wound #7 Left Toe Third o Patient to wear own compression stockings o Elevate legs to the level of the heart and pump ankles as often as possible Additional Orders / Instructions Wound #1 Right Toe Great o Stop Smoking - Please continue to stay away from alcohol o Other: - Please add vitamin A, vitamin C, and Zinc supplements to your diet Wound #2 Right Toe Second o Stop Smoking - Please continue to stay away from alcohol o Other: - Please add vitamin A, vitamin C, and Zinc supplements to your diet Wound #3 Right Toe Third o Stop Smoking - Please continue to stay away from alcohol o Other: - Please add vitamin A, vitamin C, and Zinc supplements to your diet Wound #4 Right,Dorsal Foot o Stop Smoking - Please continue to stay away from alcohol o Other: - Please add vitamin A, vitamin C, and Zinc supplements to your diet Wound #5 Left Toe Great o Stop Smoking - Please continue to stay away from alcohol o Other: - Please add vitamin A, vitamin C, and Zinc supplements to your diet [name], [name] ([id number]) Wound #6 Left Toe Second o Stop Smoking - Please continue to stay away from alcohol o Other: - Please add vitamin A, vitamin C, and Zinc supplements to your diet Wound #7 Left Toe Third o Stop Smoking - Please continue to stay away from alcohol o Other: - Please add vitamin A, vitamin C,  and Zinc supplements to your diet Home Health Wound #1 Right Toe Great o D/C [facility] Services - per patient request Wound #2 Right Toe Second o D/C [facility] - per patient request Wound #3 Right Toe Third o D/C [facility] - per patient request Wound #4 Right,Dorsal Foot o D/C [place] - per patient request Wound #5 Left Toe Great o D/C [facility] -  per patient request Wound #6 Left Toe Second o D/C [facility] - per patient request Wound #7 Left Toe Third o D/C [facility] - per patient request Notes I'm going to recommend that we continue with the Current wound care measures for the next week. If anything worsened significantly patient will contact our office for additional recommendations. Otherwise hopefully he will continue to note improvement in regard to his wounds which do seem to be making great progress. Please see above for the current wound care orders. Electronic Signature(s) Signed: 11/12/2016 4:13:08 PM By: Worthy Keeler PA-C Entered By: Worthy Keeler on 11/12/2016 16:09:41 [name], [name] ([id number]) -------------------------------------------------------------------------------- Problem List Details Patient Name: KJ, IMBERT 11/12/2016 3:30 Date of Service: PM Medical Record [id number] Number: Patient Account Number: [id number] Date of Birth/Sex: 10/05/1950 (66 y.o. Male) Treating RN: Elyn Peers, Other Clinician: Primary Care Provider: Olivia Mackie Treating STONE III, MCLEAN-SCOCOZZA, Provider/Extender: Margarita Grizzle Referring Provider: Harless Nakayama in Treatment: 7 Active Problems ICD-10 Encounter Code Description Active Date Diagnosis E11.621 Type 2 diabetes mellitus with foot ulcer 09/21/2016 Yes I87.313 Chronic venous hypertension (idiopathic) with ulcer of 09/21/2016 Yes bilateral lower extremity I89.0 Lymphedema, not elsewhere classified 09/21/2016 Yes L97.522 Non-pressure chronic ulcer of other part of left foot with fat 09/21/2016 Yes layer exposed L97.512 Non-pressure chronic ulcer of other part of right foot with 09/21/2016 Yes fat layer exposed F17.218 Nicotine dependence, cigarettes, 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/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 F10.19 Alcohol abuse with unspecified alcohol-induced disorder 09/21/2016 Yes Inactive Problems Resolved Problems Electronic  Signature(s) KHYLE, GOODELL ([id number]) Signed: 11/12/2016 4:13:08 PM By: Worthy Keeler PA-C Entered By: Worthy Keeler on 11/12/2016 16:00:05 Grizzell, [name] ([id number]) -------------------------------------------------------------------------------- Progress Note Details Patient Name: [name] 11/12/2016 3:30 Date of Service: PM Medical Record [id number] Number: Patient Account Number: [id number] Date of Birth/Sex: [birth year] (67 y.o. Male) Treating RN: Elyn Peers, Other Clinician: Primary Care Provider: Olivia Mackie Treating STONE III, MCLEAN-SCOCOZZA, Provider/Extender: HOYT Referring Provider: Harless Nakayama in Treatment: 7 Subjective Chief Complaint Information obtained from Patient Patients presents for treatment of an open diabetic ulcer to both feet History of Present Illness (HPI) The following HPI elements were documented for the patient's wound: Location: bilateral feet ulceration on the toes Quality: Patient reports experiencing a dull pain to affected area(s). Severity: Patient states wound are getting better Duration: Patient has had the wound for > 3 months prior to seeking treatment at the wound center Timing: Pain in wound is constant (hurts all the time) Context: The wound would happen gradually Modifying Factors: Other treatment(s) tried include:treatment for lymphedema and is seen by the podiatrist Dr. Caryl Comes Associated Signs and Symptoms: Patient reports having increase swelling. 66 year old patient here to see as for bilateral feet ulceration to on his left first and second toe and 2 on his right first and second toe, which she's had for about 4 months. 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 Donella Stade. The patient is also undergoing workup by medical oncology for a intra- abdominal lymphadenopathy. Past  medical history significant for CHF, diabetes mellitus, hypertension, varicose veins with lymphedema and squamous cell cancer of the skin of the buttocks. He is also status post appendectomy, inguinal lymph node biopsy, rectal biopsy and rectal examination under anesthesia. he currently smokes cigarettes about half packet a day. In March of this year he was seen by Dr. Hortencia Pilar, for evaluation of bilateral varicose veins and besides wearing compression stockings he had recommended laser ablation of the right and left great saphenous veins to eleviate the symptoms and complications of severe superficial venous reflux disease. He also recommended lymphedema pumps for better control of his lymphedema. The patient recently has had on 08/23/2016, right greater saphenous vein ablation with the laser energy Earlier lower extremity venous reflux examination done on 05/08/2016 showed no DVT or SVT both lower legs but incompetence of bilateral great saphenous veins was present. A lower arterial study was also done and there was no significant right lower and left lower extremity problems based on a normal toe brachial index bilaterally and the ABI was 1.221 the left and 1.23 on the Colan, Marqual E. ([id number]) right. His post ablation venous duplex examination showed successful ablation of the right GS vein with thrombus formation 2 below the right saphenofemoral junction. The deep system was patent without evidence of thrombosis and this was done on 08/30/2016. 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 Addendum: regarding his x-rays done today and x-ray of the left foot -- IMPRESSION: No objective evidence of osteomyelitis. There are soft tissue changes which may reflect cellulitis. X-ray of the right foot -- IMPRESSION:Findings compatible with cellulitis of the toes. No  objective evidence of osteomyelitis is observed. 10/01/16 on evaluation today patient's wounds appeared to be doing some better. I did review the x-rays as well which showed no evidence of osteomyelitis although there was evidence on x-ray of cellulitis. He fortunately is not having any discomfort although he continues to have some swelling. He does not remember being on any antibiotics recently. 10/15/16 on evaluation today patient's wounds overall appear to be doing better although he does have a new location noted on the left foot. Fortunately he is not having significant pain. It almost has the appearance that something is rubbing on the end of his toes but he wears the open toe shoes and according to what he is telling me never wears anything that would rub on his foot. There is no evidence of infection and specifically no evidence of a fungal infection 10/22/16 On evaluation today patient's wounds appeared to be doing better compared to last week in regard to his bilateral lower extremities. Fortunately I happy with how things are progressing although he still has ulcers I feel like that he is improving and appropriate manner. 11/12/16 on evaluation today patient appears to be doing well in regard to his bilateral feet and the respective wounds. We have been using surrounding her dressings along with an antifungal cream which seems to be doing very well. He has no bilateral dysfunction noticed that the rituals are weight loss at this point. He also has no nausea or vomiting a note purulent discharge. He did see Vein and vascular today and he tells me that they told him he could have surgery for his venous stasis but they  did not feel like it was worth it in his words. Fortunately patient's wounds do appear to be getting sneakily better. Objective Constitutional Well-nourished and well-hydrated in no acute distress. Vitals Time Taken: 3:38 PM, Height: 69 in, Weight: 168 lbs, BMI: 24.8,  Temperature: 98.2 F, Pulse: 75 bpm, Respiratory Rate: 16 breaths/min, Blood Pressure: 142/91 mmHg. Manthe, [place] E. ([id number]) Respiratory normal breathing without difficulty. clear to auscultation bilaterally. Cardiovascular regular rate and rhythm with normal S1, S2. 2+ dorsalis pedis/posterior tibialis pulses. 1+ pitting edema of the bilateral lower extremities. Psychiatric this patient is able to make decisions and demonstrates good insight into disease process. Alert and Oriented x 3. pleasant and cooperative. General Notes: Patient's wound beds appear to be granular in regard to the left second toe wound slightly hyper granular but overall are doing well with no evidence of infection. No debridement required today. Integumentary (Hair, Skin) Wound #1 status is Open. Original cause of wound was Gradually Appeared. The wound is located on the Right Toe Great. The wound measures 2.5cm length x 6cm width x 0.2cm depth; 11.781cm^2 area and 2.356cm^3 volume. There is Fat Layer (Subcutaneous Tissue) Exposed exposed. There is no tunneling or undermining noted. There is a large amount of serosanguineous drainage noted. The wound margin is flat and intact. There is large (67-100%) pink granulation within the wound bed. There is no necrotic tissue within the wound bed. The periwound skin appearance exhibited: Excoriation, Maceration. The periwound skin appearance did not exhibit: Callus, Crepitus, Induration, Rash, Scarring, Dry/Scaly, Atrophie Blanche, Cyanosis, Ecchymosis, Hemosiderin Staining, Mottled, Pallor, Rubor, Erythema. Periwound temperature was noted as No Abnormality. Wound #2 status is Open. Original cause of wound was Gradually Appeared. The wound is located on the Right Toe Second. The wound measures 0.6cm length x 0.9cm width x 0.1cm depth; 0.424cm^2 area and 0.042cm^3 volume. There is Fat Layer (Subcutaneous Tissue) Exposed exposed. There is no tunneling or undermining noted.  There is a large amount of serosanguineous drainage noted. The wound margin is flat and intact. There is large (67-100%) pink granulation within the wound bed. There is no necrotic tissue within the wound bed. The periwound skin appearance exhibited: Excoriation, Maceration. The periwound skin appearance did not exhibit: Callus, Crepitus, Induration, Rash, Scarring, Dry/Scaly, Atrophie Blanche, Cyanosis, Ecchymosis, Hemosiderin Staining, Mottled, Pallor, Rubor, Erythema. Periwound temperature was noted as No Abnormality. Wound #3 status is Open. Original cause of wound was Gradually Appeared. The wound is located on the Right Toe Third. The wound measures 0.1cm length x 0.1cm width x 0.1cm depth; 0.008cm^2 area and 0.001cm^3 volume. There is Fat Layer (Subcutaneous Tissue) Exposed exposed. There is no tunneling or undermining noted. There is a large amount of serous drainage noted. The wound margin is flat and intact. There is large (67-100%) pink granulation within the wound bed. There is no necrotic tissue within the wound bed. The periwound skin appearance exhibited: Excoriation. The periwound skin appearance did not exhibit: Callus, Crepitus, Induration, Rash, Scarring, Dry/Scaly, Maceration, Atrophie Blanche, Cyanosis, Ecchymosis, Hemosiderin Staining, Mottled, Pallor, Rubor, Erythema. Periwound temperature was noted as No Abnormality. Wound #4 status is Open. Original cause of wound was Gradually Appeared. The wound is located on the Right,Dorsal Foot. The wound measures 3cm length x 0.5cm width x 0.1cm depth; 1.178cm^2 area and 0.118cm^3 volume. There is no tunneling or undermining noted. There is a large amount of serous drainage noted. The wound margin is distinct with the outline attached to the wound base. There is large (67-100%) Stauffer, Jaimon E. ([id number]) red  granulation within the wound bed. There is no necrotic tissue within the wound bed. The periwound skin appearance exhibited:  Maceration. Periwound temperature was noted as No Abnormality. Wound #5 status is Open. Original cause of wound was Gradually Appeared. The wound is located on the Left Toe Great. The wound measures 0.5cm length x 0.8cm width x 0.1cm depth; 0.314cm^2 area and 0.031cm^3 volume. There is Fat Layer (Subcutaneous Tissue) Exposed exposed. There is no tunneling or undermining noted. There is a large amount of serous drainage noted. The wound margin is flat and intact. There is large (67-100%) pink granulation within the wound bed. There is no necrotic tissue within the wound bed. The periwound skin appearance exhibited: Excoriation. The periwound skin appearance did not exhibit: Callus, Crepitus, Induration, Rash, Scarring, Dry/Scaly, Maceration, Atrophie Blanche, Cyanosis, Ecchymosis, Hemosiderin Staining, Mottled, Pallor, Rubor, Erythema. Periwound temperature was noted as No Abnormality. Wound #6 status is Open. Original cause of wound was Gradually Appeared. The wound is located on the Left Toe Second. The wound measures 0.5cm length x 0.5cm width x 0.1cm depth; 0.196cm^2 area and 0.02cm^3 volume. There is Fat Layer (Subcutaneous Tissue) Exposed exposed. There is no tunneling or undermining noted. There is a large amount of serous drainage noted. The wound margin is flat and intact. There is large (67-100%) pink granulation within the wound bed. There is no necrotic tissue within the wound bed. The periwound skin appearance exhibited: Excoriation, Maceration. The periwound skin appearance did not exhibit: Callus, Crepitus, Induration, Rash, Scarring, Dry/Scaly, Atrophie Blanche, Cyanosis, Ecchymosis, Hemosiderin Staining, Mottled, Pallor, Rubor, Erythema. Periwound temperature was noted as No Abnormality. Wound #7 status is Open. Original cause of wound was Gradually Appeared. The wound is located on the Left Toe Third. The wound measures 0.1cm length x 0.1cm width x 0.1cm depth; 0.008cm^2 area  and 0.001cm^3 volume. There is Fat Layer (Subcutaneous Tissue) Exposed exposed. There is no tunneling or undermining noted. There is a large amount of serous drainage noted. The wound margin is flat and intact. There is large (67-100%) red granulation within the wound bed. There is no necrotic tissue within the wound bed. The periwound skin appearance exhibited: Excoriation. The periwound skin appearance did not exhibit: Callus, Crepitus, Induration, Rash, Scarring, Dry/Scaly, Maceration, Atrophie Blanche, Cyanosis, Ecchymosis, Hemosiderin Staining, Mottled, Pallor, Rubor, Erythema. 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 ([id number]) Plan Wound Cleansing: Wound #1 Right Toe Great: Cleanse wound with mild soap and water May Shower, gently pat wound dry prior to applying new dressing. Wound #2 Right Toe Second: Cleanse wound with mild soap and water May Shower, gently pat wound dry prior to applying new dressing. Wound #3 Right Toe Third: Cleanse wound with mild soap and water May Shower, gently pat wound dry prior to applying new dressing. Wound #4 Right,Dorsal Foot: Cleanse wound with mild soap and water May Shower, gently pat wound dry prior to applying new dressing. Wound #5 Left Toe Great: Cleanse wound with mild soap and water May Shower, gently pat wound dry prior to applying new dressing. Wound #6 Left Toe Second: Cleanse wound  with mild soap and water May Shower,  gently pat wound dry prior to applying new dressing. Wound #7 Left Toe Third: Cleanse wound with mild soap and water May Shower, gently pat wound dry prior to applying new dressing. Anesthetic: Wound #1 Right Toe Great: Topical Lidocaine 4% cream applied to wound bed prior to debridement Wound #2 Right Toe Second: Topical Lidocaine 4% cream applied to wound bed prior to debridement Wound #3 Right Toe Third: Topical Lidocaine 4% cream applied to wound bed prior to debridement Wound #4 Right,Dorsal Foot: Topical Lidocaine 4% cream applied to wound bed prior to debridement Wound #5 Left Toe Great: Topical Lidocaine 4% cream applied to wound bed prior to debridement Wound #6 Left Toe Second: Topical Lidocaine 4% cream applied to wound bed prior to debridement Wound #7 Left Toe Third: Topical Lidocaine 4% cream applied to wound bed prior to debridement Skin Barriers/Peri-Wound Care: Wound #1 Right Toe Great: Antifungal cream - on and between toes then AqAg over areas Wound #2 Right Toe Second: Antifungal cream - on and between toes then AqAg over areas Wound #3 Right Toe Third: Antifungal cream - on and between toes then AqAg over areas Wound #4 Right,Dorsal Foot: Foxworthy, Adis E. ([id number]) Antifungal cream - on and between toes then AqAg over areas Wound #5 Left Toe Great: Antifungal cream - on and between toes then AqAg over areas Wound #6 Left Toe Second: Antifungal cream - on and between toes then AqAg over areas Wound #7 Left Toe Third: Antifungal cream - on and between toes then AqAg over areas Primary Wound Dressing: Wound #1 Right Toe Great: Aquacel Ag Wound #2 Right Toe Second: Aquacel Ag Wound #3 Right Toe Third: Aquacel Ag Wound #4 Right,Dorsal Foot: Aquacel Ag Wound #5 Left Toe Great: Aquacel Ag Wound #6 Left Toe Second: Aquacel Ag Wound #7 Left Toe Third: Aquacel Ag Secondary Dressing: Wound #1 Right Toe Great: ABD pad Gauze and Kerlix/Conform Wound #2  Right Toe Second: ABD pad Gauze and Kerlix/Conform Wound #3 Right Toe Third: ABD pad Gauze and Kerlix/Conform Wound #4 Right,Dorsal Foot: ABD pad Gauze and Kerlix/Conform Wound #5 Left Toe Great: ABD pad Gauze and Kerlix/Conform Wound #6 Left Toe Second: ABD pad Gauze and Kerlix/Conform Wound #7 Left Toe Third: ABD pad Gauze and Kerlix/Conform Dressing Change Frequency: Wound #1 Right Toe Great: Change dressing every day. Wound #2 Right Toe Second: Change dressing every day. Wound #3 Right Toe Third: Change dressing every day. Barthelemy, Promise E. ([id number]) Wound #4 Right,Dorsal Foot: Change dressing every day. Wound #5 Left Toe Great: Change dressing every day. Wound #6 Left Toe Second: Change dressing every day. Wound #7 Left Toe Third: Change dressing every day. Follow-up Appointments: Wound #1 Right Toe Great: Return Appointment in 1 week. Wound #2 Right Toe Second: Return Appointment in 1 week. Wound #3 Right Toe Third: Return Appointment in 1 week. Wound #4 Right,Dorsal Foot: Return Appointment in 1 week. Wound #5 Left Toe Great: Return Appointment in 1 week. Wound #6 Left Toe Second: Return Appointment in 1 week. Wound #7 Left Toe Third: Return Appointment in 1 week. Edema Control: Wound #1 Right Toe Great: Patient to wear own compression stockings Elevate legs to the level of the heart and pump ankles as often as possible Wound #2 Right Toe Second: Patient to wear own compression stockings Elevate legs to the level of the heart and pump ankles as often as possible Wound #3 Right Toe Third: Patient to wear own compression stockings Elevate legs  to the level of the heart and pump ankles as often as possible Wound #4 Right,Dorsal Foot: Patient to wear own compression stockings Elevate legs to the level of the heart and pump ankles as often as possible Wound #5 Left Toe Great: Patient to wear own compression stockings Elevate legs to the level of the  heart and pump ankles as often as possible Wound #6 Left Toe Second: Patient to wear own compression stockings Elevate legs to the level of the heart and pump ankles as often as possible Wound #7 Left Toe Third: Patient to wear own compression stockings Elevate legs to the level of the heart and pump ankles as often as possible Additional Orders / Instructions: Wound #1 Right Toe Great: Stop Smoking - Please continue to stay away from alcohol Other: - Please add vitamin A, vitamin C, and Zinc supplements to your diet Wound #2 Right Toe Second: Stop Smoking - Please continue to stay away from alcohol Shelnutt, Zamere E. ([id number]) Other: - Please add vitamin A, vitamin C, and Zinc supplements to your diet Wound #3 Right Toe Third: Stop Smoking - Please continue to stay away from alcohol Other: - Please add vitamin A, vitamin C, and Zinc supplements to your diet Wound #4 Right,Dorsal Foot: Stop Smoking - Please continue to stay away from alcohol Other: - Please add vitamin A, vitamin C, and Zinc supplements to your diet Wound #5 Left Toe Great: Stop Smoking - Please continue to stay away from alcohol Other: - Please add vitamin A, vitamin C, and Zinc supplements to your diet Wound #6 Left Toe Second: Stop Smoking - Please continue to stay away from alcohol Other: - Please add vitamin A, vitamin C, and Zinc supplements to your diet Wound #7 Left Toe Third: Stop Smoking - Please continue to stay away from alcohol Other: - Please add vitamin A, vitamin C, and Zinc supplements to your diet Home Health: Wound #1 Right Toe Great: D/C Home Health Services - per patient request Wound #2 Right Toe Second: D/C Home [facility] Services - per patient request Wound #3 Right Toe Third: D/[facility] - per patient request Wound #4 Right,Dorsal Foot: D/[facility] - per patient request Wound #5 Left Toe Great: D/[facility] - per patient request Wound #6 Left Toe  Second: D/[facility] - per patient request Wound #7 Left Toe Third: D/C Home [facility] Services - per patient request General Notes: I'm going to recommend that we continue with the Current wound care measures for the next week. If anything worsened significantly patient will contact our office for additional recommendations. Otherwise hopefully he will continue to note improvement in regard to his wounds which do seem to be making great progress. Please see above for the current wound care orders. Electronic Signature(s) Signed: 11/12/2016 4:13:08 PM By: Worthy Keeler PA-C Entered By: Worthy Keeler on 11/12/2016 16:09:56 Plantz, [name] ([id number]) -------------------------------------------------------------------------------- SuperBill Details Patient Name: Schnoor, Kiyaan E. Date of Service: 11/12/2016 Medical Record Patient Account Number: [id number] Number: Treating RN: Montey Hora Date of Birth/Sex: [birth year] (66 y.o. Male) Other Clinician: Delos Haring, Treating STONE III, Primary Care Provider: Olivia Mackie Provider/Extender: Charlynn Grimes, Weeks in Treatment: 7 Referring Provider: TRACY Diagnosis Coding ICD-10 Codes Code Description E11.621 Type 2 diabetes mellitus with foot ulcer I87.313 Chronic venous hypertension (idiopathic) with ulcer of bilateral lower extremity I89.0 Lymphedema, not elsewhere classified L97.522 Non-pressure chronic ulcer of other part of left foot with fat layer exposed L97.512  Non-pressure chronic ulcer of other part of right foot with fat layer exposed F17.218 Nicotine dependence, cigarettes, 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 abuse with unspecified alcohol-induced disorder Facility Procedures CPT4 Code: [id number] Description: [phone] - WOUND CARE VISIT-LEV 5 EST PT Modifier: Quantity: 1 Physician Procedures CPT4: Description Modifier Quantity Code [id number] - WC PHYS LEVEL 3 - EST PT 1 ICD-10  Description Diagnosis E11.621 Type 2 diabetes mellitus with foot ulcer I87.313 Chronic venous hypertension (idiopathic) with ulcer of bilateral lower extremity  I89.0 Lymphedema, not elsewhere classified L97.522 Non-pressure chronic ulcer of other part of left foot with fat layer exposed Electronic Signature(s) Signed: 11/12/2016 4:28:47 PM By: Montey Hora Previous Signature: 11/12/2016 4:13:08 PM Version By: Worthy Keeler PA-C Entered By: Montey Hora on 11/12/2016 38:37:79

## 2016-11-13 NOTE — Progress Notes (Signed)
[name], [name] ([id number]) Visit Report for 11/12/2016 Arrival Information Details Patient Name: [name], [name] Date of Service: 11/12/2016 3:30 PM Medical Record Number: [id number] Patient Account Number: [id number] Date of Birth/Sex: 1950/05/13 (66 y.o. [name]) Treating RN: [name] Primary Care [name]: [name] Other Clinician: Referring [name]: [name] Treating [name]/Extender: [name], [name]: 7 Visit Information History Since Last Visit Added or deleted any medications: No Patient Arrived: Ambulatory Any new allergies or adverse reactions: No Arrival Time: 15:37 Had a fall or experienced change in No Accompanied By: brother activities of daily living that may affect Transfer Assistance: None risk of falls: Patient Identification Verified: Yes Signs or symptoms of abuse/neglect since last No Secondary Verification Process Yes visito Completed: Hospitalized since last visit: No Patient Requires Transmission-Based No Has Dressing in Place as Prescribed: Yes Precautions: Pain Present Now: No Patient Has Alerts: Yes Patient Alerts: DMII Electronic Signature(s) Signed: 11/12/2016 4:48:06 PM By: [name] Entered By: [name] on 11/12/2016 15:37:54 [name], [name] ([id number]) -------------------------------------------------------------------------------- Clinic Level of Care Assessment Details Patient Name: [name], [name] Date of Service: 11/12/2016 3:30 PM Medical Record Number: [id number] Patient Account Number: [id number] Date of Birth/Sex: 1951-01-15 (67 y.o. [name]) Treating RN: [name] Primary Care Adarryl Goldammer: [name] Other Clinician: Referring Mehreen Azizi: [name] Treating Vickii Volland/Extender: [name], [name]: 7 Clinic Level of Care Assessment Items TOOL 4 Quantity Score []  - Use when only an EandM is performed on FOLLOW-UP visit 0 ASSESSMENTS -  Nursing Assessment / Reassessment X - Reassessment of Co-morbidities (includes updates in patient status) 1 10 X - Reassessment of Adherence to [name] Plan 1 5 ASSESSMENTS - Wound and Skin Assessment / Reassessment []  - Simple Wound Assessment / Reassessment - one wound 0 X - Complex Wound Assessment / Reassessment - multiple wounds 7 5 []  - Dermatologic / Skin Assessment (not related to wound area) 0 ASSESSMENTS - Focused Assessment []  - Circumferential Edema Measurements - multi extremities 0 []  - Nutritional Assessment / Counseling / Intervention 0 X - Lower Extremity Assessment (monofilament, tuning fork, pulses) 1 5 []  - Peripheral Arterial Disease Assessment (using hand held doppler) 0 ASSESSMENTS - Ostomy and/or Continence Assessment and Care []  - Incontinence Assessment and Management 0 []  - Ostomy Care Assessment and Management (repouching, etc.) 0 PROCESS - Coordination of Care X - Simple Patient / Family Education for ongoing care 1 15 []  - Complex (extensive) Patient / Family Education for ongoing care 0 []  - Staff obtains Programmer, systems, Records, Test Results / Process Orders 0 []  - Staff telephones HHA, Nursing Homes / Clarify orders / etc 0 []  - Routine Transfer to another Facility (non-emergent condition) 0 [name], [name] ([id number]) []  - Routine Hospital Admission (non-emergent condition) 0 []  - New Admissions / Biomedical engineer / Ordering NPWT, Apligraf, etc. 0 []  - Emergency Hospital Admission (emergent condition) 0 X - Simple Discharge Coordination 1 10 []  - Complex (extensive) Discharge Coordination 0 PROCESS - Special Needs []  - Pediatric / Minor Patient Management 0 []  - Isolation Patient Management 0 []  - Hearing / Language / Visual special needs 0 []  - Assessment of Community assistance (transportation, D/C planning, etc.) 0 []  - Additional assistance / Altered mentation 0 []  - Support Surface(s) Assessment (bed, cushion, seat, etc.) 0 INTERVENTIONS -  Wound Cleansing / Measurement []  - Simple Wound Cleansing - one wound 0 X - Complex Wound Cleansing - multiple wounds 7 5 X - Wound Imaging (photographs - any number of  wounds) 1 5 []  - Wound Tracing (instead of photographs) 0 []  - Simple Wound Measurement - one wound 0 X - Complex Wound Measurement - multiple wounds 7 5 INTERVENTIONS - Wound Dressings X - Small Wound Dressing one or multiple wounds 7 10 []  - Medium Wound Dressing one or multiple wounds 0 []  - Large Wound Dressing one or multiple wounds 0 X - Application of Medications - topical 1 5 []  - Application of Medications - injection 0 INTERVENTIONS - Miscellaneous []  - External ear exam 0 [name], [name] ([id number]) []  - Specimen Collection (cultures, biopsies, blood, body fluids, etc.) 0 []  - Specimen(s) / Culture(s) sent or taken to Lab for analysis 0 []  - Patient Transfer (multiple staff / Harrel Lemon Lift / Similar devices) 0 []  - Simple Staple / Suture removal (25 or less) 0 []  - Complex Staple / Suture removal (26 or more) 0 []  - Hypo / Hyperglycemic Management (close monitor of Blood Glucose) 0 []  - Ankle / Brachial Index (ABI) - do not check if billed separately 0 X - Vital Signs 1 5 Has the patient been seen at the hospital within the last three years: Yes Total Score: 235 Level Of Care: New/Established - Level 5 Electronic Signature(s) Signed: 11/12/2016 4:48:06 PM By: [name] Entered By: [name] on 11/12/2016 16:28:31 [name], [name] ([id number]) -------------------------------------------------------------------------------- Encounter Discharge Information Details Patient Name: [name], [name] Date of Service: 11/12/2016 3:30 PM Medical Record Number: [id number] Patient Account Number: [id number] Date of Birth/Sex: 06-01-1950 (66 y.o. [name]) Treating RN: [name] Primary Care [name]: [name] Other Clinician: Referring Jaquil Todt: [name] Treating Avanti Jetter/Extender:  [name], [name]: 7 Encounter Discharge Information Items Discharge Pain Level: 0 Discharge Condition: Stable Ambulatory Status: Ambulatory Discharge Destination: Home Transportation: Private Auto Accompanied By: brother Schedule Follow-up Appointment: Yes Medication Reconciliation completed and provided to Patient/Care No Jamariya Davidoff: Provided on Clinical Summary of Care: 11/12/2016 Form Type Recipient Paper Patient ED Electronic Signature(s) Signed: 11/12/2016 4:15:16 PM By: Ruthine Dose Entered By: Ruthine Dose on 11/12/2016 16:15:15 [name], [name] ([id number]) -------------------------------------------------------------------------------- Lower Extremity Assessment Details Patient Name: Cosgriff, Sriyan E. Date of Service: 11/12/2016 3:30 PM Medical Record Number: [id number] Patient Account Number: [id number] Date of Birth/Sex: 20-Apr-1950 (66 y.o. [name]) Treating RN: [name] Primary Care Perseus Westall: [name] Other Clinician: Referring Aoife Bold: [name] Treating Avary Pitsenbarger/Extender: [name], [name]: 7 Vascular Assessment Pulses: Dorsalis Pedis Palpable: [Left:Yes] [Right:Yes] Posterior Tibial Extremity colors, hair growth, and conditions: Extremity Color: [Left:Hyperpigmented] [Right:Hyperpigmented] Hair Growth on Extremity: [Left:No] [Right:No] Temperature of Extremity: [Left:Warm] [Right:Warm] Capillary Refill: [Left:< 3 seconds] [Right:< 3 seconds] Electronic Signature(s) Signed: 11/12/2016 4:48:06 PM By: [name] Entered By: [name] on 11/12/2016 16:01:10 [name], [name] ([id number]) -------------------------------------------------------------------------------- Multi Wound Chart Details Patient Name: Devan, Octavis E. Date of Service: 11/12/2016 3:30 PM Medical Record Number: [id number] Patient Account Number: [id number] Date of Birth/Sex: [birth year] (66 y.o. [name]) Treating RN: [name] Primary Care Matei Magnone: [name] Other Clinician: Referring Andrian Urbach: [name] Treating Cereniti Curb/Extender: [name], [name]: 7 Vital Signs Height(in): 69 Pulse(bpm): 75 Weight(lbs): 168 Blood Pressure 142/91 (mmHg): Body Mass Index(BMI): 25 Temperature(F): 98.2 Respiratory Rate 16 (breaths/min): Photos: [1:No Photos] [2:No Photos] [3:No Photos] Wound Location: [1:Right Toe Great] [2:Right Toe Second] [3:Right Toe Third] Wounding Event: [1:Gradually Appeared] [2:Gradually Appeared] [3:Gradually Appeared] Primary Etiology: [1:Diabetic Wound/Ulcer of the Lower Extremity] [2:Diabetic Wound/Ulcer of the Lower Extremity] [3:Diabetic Wound/Ulcer of the Lower Extremity] Secondary Etiology: [1:N/A] [2:N/A] [3:N/A] Comorbid  History: [1:Anemia, Lymphedema, Congestive Heart Failure, Hypertension, Peripheral Venous Disease, Type II Diabetes, Neuropathy] [2:Anemia, Lymphedema, Congestive Heart Failure, Hypertension, Peripheral Venous Disease, Type II Diabetes,  Neuropathy] [3:Anemia, Lymphedema, Congestive Heart Failure, Hypertension, Peripheral Venous Disease, Type II Diabetes, Neuropathy] Date Acquired: [1:06/11/2016] [2:06/11/2016] [3:06/11/2016] Weeks of [name]: [1:7] [2:7] [3:7] Wound Status: [1:Open] [2:Open] [3:Open] Pending Amputation on Yes [2:Yes] [3:Yes] Presentation: Measurements L x W x D 2.5x6x0.2 [2:0.6x0.9x0.1] [3:0.1x0.1x0.1] (cm) Area (cm) : [1:11.781] [2:0.424] [3:0.008] Volume (cm) : [1:2.356] [2:0.042] [3:0.001] % Reduction in Area: [1:75.00%] [2:86.20%] [3:98.40%] % Reduction in Volume: 50.00% [2:86.30%] [3:98.00%] Classification: [1:Grade 1] [2:Grade 1] [3:Grade 1] Exudate Amount: [1:Large] [2:Large] [3:Large] Exudate Type: [1:Serosanguineous] [2:Serosanguineous] [3:Serous] Exudate Color: [1:red, brown] [2:red, brown] [3:amber] Foul Odor After [1:Yes] [2:Yes] [3:Yes] Cleansing: Odor Anticipated Due to No [2:No]  [3:No] Product Use: [name], [name] ([id number]) Wound Margin: Flat and Intact Flat and Intact Flat and Intact Granulation Amount: Large (67-100%) Large (67-100%) Large (67-100%) Granulation Quality: Pink Pink Pink Necrotic Amount: None Present (0%) None Present (0%) None Present (0%) Exposed Structures: Fat Layer (Subcutaneous Fat Layer (Subcutaneous Fat Layer (Subcutaneous Tissue) Exposed: Yes Tissue) Exposed: Yes Tissue) Exposed: Yes Fascia: No Fascia: No Fascia: No Tendon: No Tendon: No Tendon: No Muscle: No Muscle: No Muscle: No Joint: No Joint: No Joint: No Bone: No Bone: No Bone: No Epithelialization: Large (67-100%) Small (1-33%) Small (1-33%) Periwound Skin Texture: Excoriation: Yes Excoriation: Yes Excoriation: Yes Induration: No Induration: No Induration: No Callus: No Callus: No Callus: No Crepitus: No Crepitus: No Crepitus: No Rash: No Rash: No Rash: No Scarring: No Scarring: No Scarring: No Periwound Skin Maceration: Yes Maceration: Yes Maceration: No Moisture: Dry/Scaly: No Dry/Scaly: No Dry/Scaly: No Periwound Skin Color: Atrophie Blanche: No Atrophie Blanche: No Atrophie Blanche: No Cyanosis: No Cyanosis: No Cyanosis: No Ecchymosis: No Ecchymosis: No Ecchymosis: No Erythema: No Erythema: No Erythema: No Hemosiderin Staining: No Hemosiderin Staining: No Hemosiderin Staining: No Mottled: No Mottled: No Mottled: No Pallor: No Pallor: No Pallor: No Rubor: No Rubor: No Rubor: No Temperature: No Abnormality No Abnormality No Abnormality Tenderness on No No No Palpation: Wound Preparation: Ulcer Cleansing: Ulcer Cleansing: Ulcer Cleansing: Rinsed/Irrigated with Rinsed/Irrigated with Rinsed/Irrigated with Saline Saline Saline Topical Anesthetic Topical Anesthetic Topical Anesthetic Applied: None Applied: None Applied: None Wound Number: [id number] 5 6  Photos: No Photos No Photos No Photos Wound Location: Right Foot - Dorsal Left Toe  Great Left Toe Second Wounding Event: Gradually Appeared Gradually Appeared Gradually Appeared Primary 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 ([id number]) Date Acquired: 06/11/2016 06/11/2016 06/11/2016 Weeks of [name]: 7 7 7  Wound Status: Open Open Open Pending Amputation on Yes Yes Yes Presentation: Measurements L x W x D 3x0.5x0.1 0.5x0.8x0.1 0.5x0.5x0.1 (cm) Area (cm) : 1.178 0.314 0.196 Volume (cm) : 0.118 0.031 0.02 % Reduction in Area: 73.50% 97.80% 90.80% % Reduction in Volume: 73.50% 97.80% 90.70% Classification: Grade 1 Grade 1 Grade 1 Exudate Amount: Large Large Large Exudate Type: Serous Serous Serous Exudate Color: amber amber amber Foul Odor After Yes Yes Yes Cleansing: Odor Anticipated Due to No No No Product Use: Wound Margin: Distinct, outline attached Flat and Intact Flat and  Intact Granulation Amount: Large (67-100%) Large (67-100%) Large (67-100%) Granulation Quality: Red Pink Pink Necrotic Amount: None Present (0%) None Present (0%) None Present (0%) Exposed Structures: N/A Fat Layer (Subcutaneous Fat Layer (Subcutaneous Tissue) Exposed: Yes Tissue) Exposed: Yes Fascia: No Fascia: No Tendon: No Tendon: No Muscle: No Muscle: No Joint: No Joint: No Bone: No Bone: No Epithelialization: None Small (1-33%) Small (1-33%) Periwound Skin Texture: No Abnormalities Noted Excoriation: Yes Excoriation: Yes Induration: No Induration: No Callus:  No Callus: No Crepitus: No Crepitus: No Rash: No Rash: No Scarring: No Scarring: No Periwound Skin Maceration: Yes Maceration: No Maceration: Yes Moisture: Dry/Scaly: No Dry/Scaly: No Periwound Skin Color: No Abnormalities Noted Atrophie Blanche: No Atrophie Blanche: No Cyanosis: No Cyanosis: No Ecchymosis: No Ecchymosis: No Erythema: No Erythema: No Hemosiderin Staining: No Hemosiderin Staining: No Mottled: No Mottled: No Pallor: No Pallor: No Rubor: No Rubor: No Temperature: No Abnormality No Abnormality No Abnormality Tenderness on No No No Palpation: Alderfer, Bardia E. ([id number]) Wound Preparation: Ulcer Cleansing: Ulcer Cleansing: Ulcer Cleansing: Rinsed/Irrigated with Rinsed/Irrigated with Rinsed/Irrigated with Saline Saline Saline Topical Anesthetic Topical Anesthetic Topical Anesthetic Applied: None Applied: None Applied: None Wound Number: 7 N/A N/A Photos: No Photos N/A N/A Wound Location: Left Toe Third N/A N/A Wounding Event: Gradually Appeared N/A N/A Primary Etiology: Diabetic Wound/Ulcer of N/A N/A the Lower Extremity Secondary Etiology: Pressure Ulcer N/A N/A Comorbid History: Anemia, Lymphedema, N/A N/A Congestive Heart Failure, Hypertension, Peripheral Venous Disease, Type II Diabetes, Neuropathy Date Acquired: 10/14/2016 N/A N/A Weeks of [name]: 4 N/A N/A Wound Status: Open N/A N/A Pending Amputation on No N/A N/A Presentation: Measurements L x W x D 0.1x0.1x0.1 N/A N/A (cm) Area (cm) : 0.008 N/A N/A Volume (cm) : 0.001 N/A N/A % Reduction in Area: 99.50% N/A N/A % Reduction in Volume: 99.30% N/A N/A Classification: Grade 1 N/A N/A Exudate Amount: Large N/A N/A Exudate Type: Serous N/A N/A Exudate Color: amber N/A N/A Foul Odor After Yes N/A N/A Cleansing: Odor Anticipated Due to No N/A N/A Product Use: Wound Margin: Flat and Intact N/A N/A Granulation Amount: Large (67-100%) N/A N/A Granulation Quality: Red N/A  N/A Necrotic Amount: None Present (0%) N/A N/A Exposed Structures: Fat Layer (Subcutaneous N/A N/A Tissue) Exposed: Yes Fascia: No Tendon: No Muscle: No Joint: No Bone: No [name], [name] ([id number]) Epithelialization: None N/A N/A Periwound Skin Texture: Excoriation: Yes N/A N/A Induration: No Callus: No Crepitus: No Rash: No Scarring: No Periwound Skin Maceration: No N/A N/A Moisture: Dry/Scaly: No Periwound Skin Color: Atrophie Blanche: No N/A N/A Cyanosis: No Ecchymosis: No Erythema: No Hemosiderin Staining: No Mottled: No Pallor: No Rubor: No Temperature: N/A N/A N/A Tenderness on No N/A N/A Palpation: Wound Preparation: Ulcer Cleansing: N/A N/A Rinsed/Irrigated with Saline Topical Anesthetic Applied: None [name] Notes Electronic Signature(s) Signed: 11/12/2016 4:48:06 PM By: [name] Entered By: [name] on 11/12/2016 16:01:49 [name], [name] ([id number]) -------------------------------------------------------------------------------- [place] Details Patient Name: Duquette, Criag E. Date of Service: 11/12/2016 3:30 PM Medical Record Number: [id number] Patient Account Number: [id number] Date of Birth/Sex: 02-03-1951 (66 y.o. [name]) Treating RN: [name] Primary Care Miki Blank: [name] Other Clinician: Referring Yamile Roedl: [name] Treating Holliday Sheaffer/Extender: [name], [name]: 7 Active Inactive ` Abuse / Safety / Falls / Self Care Management Nursing Diagnoses: Potential for falls Goals: Patient will remain injury free related to falls Date Initiated: 09/21/2016 Target Resolution Date: 11/30/2016 Goal Status: Active Interventions: Assess fall risk on admission and as needed Notes: ` Nutrition  Nursing Diagnoses: Potential for alteratiion in Nutrition/Potential for imbalanced nutrition Goals: Patient/caregiver agrees to and verbalizes understanding of need to use  nutritional supplements and/or vitamins as prescribed Date Initiated: 09/21/2016 Target Resolution Date: 11/30/2016 Goal Status: Active Interventions: Assess patient nutrition upon admission and as needed per policy Notes: ` Orientation to the Wound Care Program Nursing Diagnoses: Knowledge deficit related to the wound healing center program Beaulac, CARSYN TAUBMAN ([id number]) Goals: Patient/caregiver will verbalize understanding of the [place] Date Initiated: 09/21/2016 Target Resolution Date: 11/30/2016 Goal Status: Active Interventions: Provide education on orientation to the wound center Notes: ` Wound/Skin Impairment Nursing Diagnoses: Knowledge deficit related to smoking impact on wound healing Goals: Ulcer/skin breakdown will have a volume reduction of 30% by week 4 Date Initiated: 09/21/2016 Target Resolution Date: 11/30/2016 Goal Status: Active Ulcer/skin breakdown will have a volume reduction of 50% by week 8 Date Initiated: 09/21/2016 Target Resolution Date: 11/30/2016 Goal Status: Active Ulcer/skin breakdown will have a volume reduction of 80% by week 12 Date Initiated: 09/21/2016 Target Resolution Date: 11/30/2016 Goal Status: Active Ulcer/skin breakdown will heal within 14 weeks Date Initiated: 09/21/2016 Target Resolution Date: 11/30/2016 Goal Status: Active Interventions: Assess patient/caregiver ability to obtain necessary supplies Assess patient/caregiver ability to perform ulcer/skin care regimen upon admission and as needed Assess ulceration(s) every visit Notes: Electronic Signature(s) Signed: 11/12/2016 4:48:06 PM By: [name] Entered By: [name] on 11/12/2016 16:01:38 [name], [name] ([id number]) -------------------------------------------------------------------------------- Pain Assessment Details Patient Name: Prien, Toryn E. Date of Service: 11/12/2016 3:30 PM Medical Record Number: [id number] Patient Account Number:  [id number] Date of Birth/Sex: April 21, 1950 (66 y.o. [name]) Treating RN: [name] Primary Care Kasir Hallenbeck: [name] Other Clinician: Referring Floyce Bujak: [name] Treating Kieryn Burtis/Extender: [name], [name]: 7 Active Problems Location of Pain Severity and Description of Pain Patient Has Paino No Site Locations Pain Management and Medication Current Pain Management: Notes Topical or injectable lidocaine is offered to patient for acute pain when surgical debridement is performed. If needed, Patient is instructed to use over the counter pain medication for the following 24-48 hours after debridement. Wound care MDs do not prescribed pain medications. Patient has chronic pain or uncontrolled pain. Patient has been instructed to make an appointment with their Primary Care Physician for pain management. Electronic Signature(s) Signed: 11/12/2016 4:48:06 PM By: [name] Entered By: [name] on 11/12/2016 15:38:56 [name], [name] ([id number]) -------------------------------------------------------------------------------- Patient/Caregiver Education Details Patient Name: TOBI, LEINWEBER 11/12/2016 3:30 Date of Service: PM Medical Record [id number] Number: Patient Account Number: [id number] Date of Birth/Gender: 1950/10/23 (66 y.o. [name]) Treating RN: Elyn Peers, Other Clinician: Primary Care Physician: Olivia Mackie Treating STONE III, Hol[place]: Margarita Grizzle Referring Physician: Harless Nakayama in [name]: 7 Education Assessment Education Provided To: Patient and Caregiver Education Topics Provided Wound/Skin Impairment: Handouts: Other: wound care as ordered Methods: Demonstration, Explain/Verbal Responses: State content correctly Electronic Signature(s) Signed: 11/12/2016 4:48:06 PM By: [name] Entered By: [name] on 11/12/2016 16:02:33 [name], Howard EMarland Kitchen  ([id number]) -------------------------------------------------------------------------------- Wound Assessment Details Patient Name: Clinkscale, Silverio E. Date of Service: 11/12/2016 3:30 PM Medical Record Number: [id number] Patient Account Number: [id number] Date of Birth/Sex: 08/29/1950 (66 y.o. [name]) Treating RN: [name] Primary Care Jayveion Stalling: [name] Other Clinician: Referring Felipe Cabell: [name] Treating Quest Tavenner/Extender: [name], [name]: 7 Wound Status Wound Number: 1 Primary Diabetic Wound/Ulcer of the Lower Etiology: Extremity Wound Location: Right Toe Great Wound Open Wounding Event: Gradually Appeared Status: Date Acquired: 06/11/2016 Comorbid Anemia, Lymphedema, Congestive Weeks  Of [name]: 7 History: Heart Failure, Hypertension, Peripheral Clustered Wound: No Venous Disease, Type II Diabetes, Pending Amputation On Presentation Neuropathy Photos Photo Uploaded By: [name] on 11/12/2016 16:30:35 Wound Measurements Length: (cm) 2.5 Width: (cm) 6 Depth: (cm) 0.2 Area: (cm) 11.781 Volume: (cm) 2.356 % Reduction in Area: 75% % Reduction in Volume: 50% Epithelialization: Large (67-100%) Tunneling: No Undermining: No Wound Description Classification: Grade 1 Foul Odor After Wound Margin: Flat and Intact Due to Product Exudate Amount: Large Slough/Fibrino Exudate Type: Serosanguineous Exudate Color: red, brown Cleansing: Yes Use: No Yes Wound Bed Granulation Amount: Large (67-100%) Exposed Structure Granulation Quality: Pink Fascia Exposed: No Necrotic Amount: None Present (0%) Fat Layer (Subcutaneous Tissue) Exposed: Yes Diver, Lorenzo E. ([id number]) Tendon Exposed: No Muscle Exposed: No Joint Exposed: No Bone Exposed: No Periwound Skin Texture Texture Color No Abnormalities Noted: No No Abnormalities Noted: No Callus: No Atrophie Blanche: No Crepitus: No Cyanosis: No Excoriation:  Yes Ecchymosis: No Induration: No Erythema: No Rash: No Hemosiderin Staining: No Scarring: No Mottled: No Pallor: No Moisture Rubor: No No Abnormalities Noted: No Dry / Scaly: No Temperature / Pain Maceration: Yes Temperature: No Abnormality Wound Preparation Ulcer Cleansing: Rinsed/Irrigated with Saline Topical Anesthetic Applied: None [name] Notes Wound #1 (Right Toe Great) 1. Cleansed with: Clean wound with Normal Saline 3. Peri-wound Care: Antifungal cream 4. Dressing Applied: Aquacel Ag 5. Secondary Dressing Applied Dry Gauze Kerlix/Conform 7. Secured with Tape Notes netting Electronic Signature(s) Signed: 11/12/2016 3:49:58 PM By: [name] Entered By: [name] on 11/12/2016 15:49:57 Hunley, [name] ([id number]) -------------------------------------------------------------------------------- Wound Assessment Details Patient Name: Bezanson, Fabrizio E. Date of Service: 11/12/2016 3:30 PM Medical Record Number: [id number] Patient Account Number: [id number] Date of Birth/Sex: [birth year] (66 y.o. [name]) Treating RN: [name] Primary Care Dickie Labarre: [name] Other Clinician: Referring Chidera Dearcos: [name] Treating Kee Drudge/Extender: [name], [name]: 7 Wound Status Wound Number: 2 Primary Diabetic Wound/Ulcer of the Lower Etiology: Extremity Wound Location: Right Toe Second Wound Open Wounding Event: Gradually Appeared Status: Date Acquired: 06/11/2016 Comorbid Anemia, Lymphedema, Congestive Weeks Of [name]: 7 History: Heart Failure, Hypertension, Peripheral Clustered Wound: No Venous Disease, Type II Diabetes, Pending Amputation On Presentation Neuropathy Photos Photo Uploaded By: [name] on 11/12/2016 16:31:05 Wound Measurements Length: (cm) 0.6 Width: (cm) 0.9 Depth: (cm) 0.1 Area: (cm) 0.424 Volume: (cm) 0.042 % Reduction in Area: 86.2% % Reduction in Volume:  86.3% Epithelialization: Small (1-33%) Tunneling: No Undermining: No Wound Description Classification: Grade 1 Foul Odor After Wound Margin: Flat and Intact Due to Product Exudate Amount: Large Slough/Fibrino Exudate Type: Serosanguineous Exudate Color: red, brown Cleansing: Yes Use: No Yes Wound Bed Granulation Amount: Large (67-100%) Exposed Structure Granulation Quality: Pink Fascia Exposed: No Necrotic Amount: None Present (0%) Fat Layer (Subcutaneous Tissue) Exposed: Yes Swango, Harel E. ([id number]) Tendon Exposed: No Muscle Exposed: No Joint Exposed: No Bone Exposed: No Periwound Skin Texture Texture Color No Abnormalities Noted: No No Abnormalities Noted: No Callus: No Atrophie Blanche: No Crepitus: No Cyanosis: No Excoriation: Yes Ecchymosis: No Induration: No Erythema: No Rash: No Hemosiderin Staining: No Scarring: No Mottled: No Pallor: No Moisture Rubor: No No Abnormalities Noted: No Dry / Scaly: No Temperature / Pain Maceration: Yes Temperature: No Abnormality Wound Preparation Ulcer Cleansing: Rinsed/Irrigated with Saline Topical Anesthetic Applied: None [name] Notes Wound #2 (Right Toe Second) 1. Cleansed with: Clean wound with Normal Saline 3. Peri-wound Care: Antifungal cream 4. Dressing Applied: Aquacel Ag 5. Secondary Dressing Applied Dry Gauze Kerlix/Conform 7. Secured with Tape Notes  netting Electronic Signature(s) Signed: 11/12/2016 3:50:19 PM By: [name] Entered By: [name] on 11/12/2016 15:50:19 Linebaugh, [name] ([id number]) -------------------------------------------------------------------------------- Wound Assessment Details Patient Name: Lamarche, Kassidy E. Date of Service: 11/12/2016 3:30 PM Medical Record Number: [id number] Patient Account Number: [id number] Date of Birth/Sex: [birth year] (66 y.o. [name]) Treating RN: [name] Primary Care Nick Armel: [name] Other Clinician: Referring  Bernis Schreur: [name] Treating Sophya Vanblarcom/Extender: [name], [name]: 7 Wound Status Wound Number: 3 Primary Diabetic Wound/Ulcer of the Lower Etiology: Extremity Wound Location: Right Toe Third Wound Open Wounding Event: Gradually Appeared Status: Date Acquired: 06/11/2016 Comorbid Anemia, Lymphedema, Congestive Weeks Of [name]: 7 History: Heart Failure, Hypertension, Peripheral Clustered Wound: No Venous Disease, Type II Diabetes, Pending Amputation On Presentation Neuropathy Photos Photo Uploaded By: [name] on 11/12/2016 16:31:06 Wound Measurements Length: (cm) 0.1 Width: (cm) 0.1 Depth: (cm) 0.1 Area: (cm) 0.008 Volume: (cm) 0.001 % Reduction in Area: 98.4% % Reduction in Volume: 98% Epithelialization: Small (1-33%) Tunneling: No Undermining: No Wound Description Classification: Grade 1 Foul Odor After Wound Margin: Flat and Intact Due to Product Exudate Amount: Large Slough/Fibrino Exudate Type: Serous Exudate Color: amber Cleansing: Yes Use: No Yes Wound Bed Granulation Amount: Large (67-100%) Exposed Structure Granulation Quality: Pink Fascia Exposed: No Necrotic Amount: None Present (0%) Fat Layer (Subcutaneous Tissue) Exposed: Yes Riggenbach, Thaddeaus E. ([id number]) Tendon Exposed: No Muscle Exposed: No Joint Exposed: No Bone Exposed: No Periwound Skin Texture Texture Color No Abnormalities Noted: No No Abnormalities Noted: No Callus: No Atrophie Blanche: No Crepitus: No Cyanosis: No Excoriation: Yes Ecchymosis: No Induration: No Erythema: No Rash: No Hemosiderin Staining: No Scarring: No Mottled: No Pallor: No Moisture Rubor: No No Abnormalities Noted: No Dry / Scaly: No Temperature / Pain Maceration: No Temperature: No Abnormality Wound Preparation Ulcer Cleansing: Rinsed/Irrigated with Saline Topical Anesthetic Applied: None [name] Notes Wound #3 (Right Toe Third) 1. Cleansed with: Clean  wound with Normal Saline 3. Peri-wound Care: Antifungal cream 4. Dressing Applied: Aquacel Ag 5. Secondary Dressing Applied Dry Gauze Kerlix/Conform 7. Secured with Tape Notes netting Electronic Signature(s) Signed: 11/12/2016 3:50:40 PM By: [name] Entered By: [name] on 11/12/2016 15:50:39 Kostick, [name] ([id number]) -------------------------------------------------------------------------------- Wound Assessment Details Patient Name: Borghi, Cypress E. Date of Service: 11/12/2016 3:30 PM Medical Record Number: [id number] Patient Account Number: [id number] Date of Birth/Sex: January 18, 1951 (66 y.o. [name]) Treating RN: [name] Primary Care Avish Torry: [name] Other Clinician: Referring Delita Chiquito: [name] Treating Jacquees Gongora/Extender: [name], [name]: 7 Wound Status Wound Number: 4 Primary Diabetic Wound/Ulcer of the Lower Etiology: Extremity Wound Location: Right Foot - Dorsal Wound Open Wounding Event: Gradually Appeared Status: Date Acquired: 06/11/2016 Comorbid Anemia, Lymphedema, Congestive Weeks Of [name]: 7 History: Heart Failure, Hypertension, Peripheral Clustered Wound: No Venous Disease, Type II Diabetes, Pending Amputation On Presentation Neuropathy Photos Photo Uploaded By: [name] on 11/12/2016 16:31:42 Wound Measurements Length: (cm) 3 Width: (cm) 0.5 Depth: (cm) 0.1 Area: (cm) 1.178 Volume: (cm) 0.118 % Reduction in Area: 73.5% % Reduction in Volume: 73.5% Epithelialization: None Tunneling: No Undermining: No Wound Description Classification: Grade 1 Foul Odor Aft Wound Margin: Distinct, outline attached Due to Produc Exudate Amount: Large Exudate Type: Serous Exudate Color: amber er Cleansing: Yes t Use: No Wound Bed Granulation Amount: Large (67-100%) Granulation Quality: Red Necrotic Amount: None Present (0%) Villaflor, Ripken E. ([id number]) Periwound Skin Texture Texture  Color No Abnormalities Noted: No No Abnormalities Noted: No Moisture Temperature / Pain No Abnormalities Noted: No Temperature: No Abnormality  Maceration: Yes Wound Preparation Ulcer Cleansing: Rinsed/Irrigated with Saline Topical Anesthetic Applied: None [name] Notes Wound #4 (Right, Dorsal Foot) 1. Cleansed with: Clean wound with Normal Saline 3. Peri-wound Care: Antifungal cream 4. Dressing Applied: Aquacel Ag 5. Secondary Dressing Applied Dry Gauze Kerlix/Conform 7. Secured with Tape Notes netting Electronic Signature(s) Signed: 11/12/2016 3:51:04 PM By: [name] Entered By: [name] on 11/12/2016 15:51:03 Diamond, [name] ([id number]) -------------------------------------------------------------------------------- Wound Assessment Details Patient Name: Lieser, Lorraine E. Date of Service: 11/12/2016 3:30 PM Medical Record Number: [id number] Patient Account Number: [id number] Date of Birth/Sex: [birth year] (66 y.o. [name]) Treating RN: [name] Primary Care Mitcheal Sweetin: [name] Other Clinician: Referring Aquila Menzie: [name] Treating Manda Holstad/Extender: [name], [name]: 7 Wound Status Wound Number: 5 Primary Diabetic Wound/Ulcer of the Lower Etiology: Extremity Wound Location: Left Toe Great Wound Open Wounding Event: Gradually Appeared Status: Date Acquired: 06/11/2016 Comorbid Anemia, Lymphedema, Congestive Weeks Of [name]: 7 History: Heart Failure, Hypertension, Peripheral Clustered Wound: No Venous Disease, Type II Diabetes, Pending Amputation On Presentation Neuropathy Photos Photo Uploaded By: [name] on 11/12/2016 16:31:43 Wound Measurements Length: (cm) 0.5 Width: (cm) 0.8 Depth: (cm) 0.1 Area: (cm) 0.314 Volume: (cm) 0.031 % Reduction in Area: 97.8% % Reduction in Volume: 97.8% Epithelialization: Small (1-33%) Tunneling: No Undermining: No Wound Description Classification:  Grade 1 Foul Odor After Wound Margin: Flat and Intact Due to Product Exudate Amount: Large Slough/Fibrino Exudate Type: Serous Exudate Color: amber Cleansing: Yes Use: No Yes Wound Bed Granulation Amount: Large (67-100%) Exposed Structure Granulation Quality: Pink Fascia Exposed: No Necrotic Amount: None Present (0%) Fat Layer (Subcutaneous Tissue) Exposed: Yes Krzyzanowski, Latroy E. ([id number]) Tendon Exposed: No Muscle Exposed: No Joint Exposed: No Bone Exposed: No Periwound Skin Texture Texture Color No Abnormalities Noted: No No Abnormalities Noted: No Callus: No Atrophie Blanche: No Crepitus: No Cyanosis: No Excoriation: Yes Ecchymosis: No Induration: No Erythema: No Rash: No Hemosiderin Staining: No Scarring: No Mottled: No Pallor: No Moisture Rubor: No No Abnormalities Noted: No Dry / Scaly: No Temperature / Pain Maceration: No Temperature: No Abnormality Wound Preparation Ulcer Cleansing: Rinsed/Irrigated with Saline Topical Anesthetic Applied: None [name] Notes Wound #5 (Left Toe Great) 1. Cleansed with: Clean wound with Normal Saline 3. Peri-wound Care: Antifungal cream 4. Dressing Applied: Aquacel Ag 5. Secondary Dressing Applied Dry Gauze Kerlix/Conform 7. Secured with Tape Notes netting Electronic Signature(s) Signed: 11/12/2016 3:51:27 PM By: [name] Entered By: [name] on 11/12/2016 15:51:26 Heffington, [name] ([id number]) -------------------------------------------------------------------------------- Wound Assessment Details Patient Name: Olivera, Manford E. Date of Service: 11/12/2016 3:30 PM Medical Record Number: [id number] Patient Account Number: [id number] Date of Birth/Sex: 1950/08/01 (66 y.o. [name]) Treating RN: [name] Primary Care Melecio Cueto: [name] Other Clinician: Referring Storm Sovine: [name] Treating Matricia Begnaud/Extender: [name], [name]: 7 Wound Status Wound Number:  6 Primary Diabetic Wound/Ulcer of the Lower Etiology: Extremity Wound Location: Left Toe Second Wound Open Wounding Event: Gradually Appeared Status: Date Acquired: 06/11/2016 Comorbid Anemia, Lymphedema, Congestive Weeks Of [name]: 7 History: Heart Failure, Hypertension, Peripheral Clustered Wound: No Venous Disease, Type II Diabetes, Pending Amputation On Presentation Neuropathy Photos Photo Uploaded By: [name] on 11/12/2016 16:32:14 Wound Measurements Length: (cm) 0.5 Width: (cm) 0.5 Depth: (cm) 0.1 Area: (cm) 0.196 Volume: (cm) 0.02 % Reduction in Area: 90.8% % Reduction in Volume: 90.7% Epithelialization: Small (1-33%) Tunneling: No Undermining: No Wound Description Classification: Grade 1 Foul Odor After Wound Margin: Flat and Intact Due to Product Exudate Amount: Large Slough/Fibrino Exudate Type: Serous Exudate  Color: amber Cleansing: Yes Use: No Yes Wound Bed Granulation Amount: Large (67-100%) Exposed Structure Granulation Quality: Pink Fascia Exposed: No Necrotic Amount: None Present (0%) Fat Layer (Subcutaneous Tissue) Exposed: Yes Kincer, Levy E. ([id number]) Tendon Exposed: No Muscle Exposed: No Joint Exposed: No Bone Exposed: No Periwound Skin Texture Texture Color No Abnormalities Noted: No No Abnormalities Noted: No Callus: No Atrophie Blanche: No Crepitus: No Cyanosis: No Excoriation: Yes Ecchymosis: No Induration: No Erythema: No Rash: No Hemosiderin Staining: No Scarring: No Mottled: No Pallor: No Moisture Rubor: No No Abnormalities Noted: No Dry / Scaly: No Temperature / Pain Maceration: Yes Temperature: No Abnormality Wound Preparation Ulcer Cleansing: Rinsed/Irrigated with Saline Topical Anesthetic Applied: None [name] Notes Wound #6 (Left Toe Second) 1. Cleansed with: Clean wound with Normal Saline 3. Peri-wound Care: Antifungal cream 4. Dressing Applied: Aquacel Ag 5. Secondary Dressing  Applied Dry Gauze Kerlix/Conform 7. Secured with Tape Notes netting Electronic Signature(s) Signed: 11/12/2016 3:51:48 PM By: [name] Entered By: [name] on 11/12/2016 15:51:48 Maker, [name] ([id number]) -------------------------------------------------------------------------------- Wound Assessment Details Patient Name: Fahr, Donnavan E. Date of Service: 11/12/2016 3:30 PM Medical Record Number: [id number] Patient Account Number: [id number] Date of Birth/Sex: 07/23/1950 (66 y.o. [name]) Treating RN: [name] Primary Care Audria Takeshita: [name] Other Clinician: Referring Camryn Lampson: [name] Treating Bertel [name]/Extender: [name], [name]: 7 Wound Status Wound Number: 7 Primary Diabetic Wound/Ulcer of the Lower Etiology: Extremity Wound Location: Left Toe Third Secondary Pressure Ulcer Wounding Event: Gradually Appeared Etiology: Date Acquired: 10/14/2016 Wound Open Weeks Of [name]: 4 Status: Clustered Wound: No Comorbid Anemia, Lymphedema, Congestive History: Heart Failure, Hypertension, Peripheral Venous Disease, Type II Diabetes, Neuropathy Photos Photo Uploaded By: [name] on 11/12/2016 16:32:14 Wound Measurements Length: (cm) 0.1 % Reduction in A Width: (cm) 0.1 % Reduction in V Depth: (cm) 0.1 Epithelializatio Area: (cm) 0.008 Tunneling: Volume: (cm) 0.001 Undermining: rea: 99.5% olume: 99.3% n: None No No Wound Description Classification: Grade 1 Foul Odor After Wound Margin: Flat and Intact Due to Product Exudate Amount: Large Slough/Fibrino Exudate Type: Serous Exudate Color: amber Cleansing: Yes Use: No Yes Wound Bed Granulation Amount: Large (67-100%) Exposed Structure Malecha, Decarlos E. ([id number]) Granulation Quality: Red Fascia Exposed: No Necrotic Amount: None Present (0%) Fat Layer (Subcutaneous Tissue) Exposed: Yes Tendon Exposed: No Muscle Exposed: No Joint Exposed:  No Bone Exposed: No Periwound Skin Texture Texture Color No Abnormalities Noted: No No Abnormalities Noted: No Callus: No Atrophie Blanche: No Crepitus: No Cyanosis: No Excoriation: Yes Ecchymosis: No Induration: No Erythema: No Rash: No Hemosiderin Staining: No Scarring: No Mottled: No Pallor: No Moisture Rubor: No No Abnormalities Noted: No Dry / Scaly: No Maceration: No Wound Preparation Ulcer Cleansing: Rinsed/Irrigated with Saline Topical Anesthetic Applied: None [name] Notes Wound #7 (Left Toe Third) 1. Cleansed with: Clean wound with Normal Saline 3. Peri-wound Care: Antifungal cream 4. Dressing Applied: Aquacel Ag 5. Secondary Dressing Applied Dry Gauze Kerlix/Conform 7. Secured with Tape Notes netting Electronic Signature(s) Signed: 11/12/2016 3:52:10 PM By: [name] Entered By: [name] on 11/12/2016 15:52:09 Axon, [name] ([id number]) -------------------------------------------------------------------------------- Vitals Details Patient Name: Ohm, Adriann E. Date of Service: 11/12/2016 3:30 PM Medical Record Number: [id number] Patient Account Number: [id number] Date of Birth/Sex: June 22, 1950 (66 y.o. [name]) Treating RN: [name] Primary Care Brace Welte: [name] Other Clinician: Referring Kelsa Jaworowski: [name] Treating Maddux First/Extender: [name], [name]: 7 Vital Signs Time Taken: 15:38 Temperature (F): 98.2 Height (in): 69 Pulse (bpm): 75 Weight (lbs): 168 Respiratory Rate (breaths/min):  16 Body Mass Index (BMI): 24.8 Blood Pressure (mmHg): 142/91 Reference Range: 80 - 120 mg / dl Electronic Signature(s) Signed: 11/12/2016 4:48:06 PM By: [name] Entered By: [name] on 11/12/2016 15:39:17

## 2016-11-14 ENCOUNTER — Encounter: Payer: Self-pay | Admitting: Gastroenterology

## 2016-11-14 ENCOUNTER — Ambulatory Visit
Admission: RE | Admit: 2016-11-14 | Discharge: 2016-11-14 | Disposition: A | Discharge status: Home / Self Care | Payer: Medicare HMO | Source: Ambulatory Visit | Attending: Internal Medicine | Admitting: Internal Medicine

## 2016-11-14 DIAGNOSIS — N183 Chronic kidney disease, stage 3 unspecified: Secondary | ICD-10-CM

## 2016-11-26 ENCOUNTER — Encounter: Payer: Medicare HMO | Admitting: Surgery

## 2016-11-26 DIAGNOSIS — E11621 Type 2 diabetes mellitus with foot ulcer: Secondary | ICD-10-CM | POA: Diagnosis not present

## 2016-11-27 ENCOUNTER — Ambulatory Visit
Admission: RE | Admit: 2016-11-27 | Discharge: 2016-11-27 | Disposition: A | Discharge status: Home / Self Care | Payer: Medicare HMO | Source: Ambulatory Visit | Attending: Radiation Oncology | Admitting: Radiation Oncology

## 2016-11-27 ENCOUNTER — Encounter: Payer: Self-pay | Admitting: Radiation Oncology

## 2016-11-27 VITALS — BP 134/80 | HR 62 | Temp 96.9°F | Wt 155.9 lb

## 2016-11-27 DIAGNOSIS — R600 Localized edema: Secondary | ICD-10-CM | POA: Insufficient documentation

## 2016-11-27 DIAGNOSIS — Z85828 Personal history of other malignant neoplasm of skin: Secondary | ICD-10-CM | POA: Diagnosis not present

## 2016-11-27 DIAGNOSIS — K7460 Unspecified cirrhosis of liver: Secondary | ICD-10-CM | POA: Diagnosis not present

## 2016-11-27 DIAGNOSIS — C44529 Squamous cell carcinoma of skin of other part of trunk: Secondary | ICD-10-CM

## 2016-11-27 DIAGNOSIS — L97519 Non-pressure chronic ulcer of other part of right foot with unspecified severity: Secondary | ICD-10-CM | POA: Diagnosis not present

## 2016-11-27 DIAGNOSIS — Z923 Personal history of irradiation: Secondary | ICD-10-CM | POA: Diagnosis not present

## 2016-11-27 DIAGNOSIS — L97529 Non-pressure chronic ulcer of other part of left foot with unspecified severity: Secondary | ICD-10-CM | POA: Insufficient documentation

## 2016-11-27 NOTE — Progress Notes (Addendum)
[name] ([id number]) Visit Report for 11/26/2016 Chief Complaint Document Details Patient Name: [name], [name] 11/26/2016 1:30 Date of Service: PM Medical Record [id number] Number: Patient Account Number: [id number] Date of Birth/Sex: 1950-10-06 (66 y.o. Male) Treating RN: Elyn Peers, Other Clinician: Primary Care Provider: Olivia Mackie Treating [name], Cherie Ouch, Provider/Extender: Referring Provider: Harless Nakayama in Treatment: 9 Information Obtained from: Patient Chief Complaint Patients presents for treatment of an open diabetic ulcer to both feet Electronic Signature(s) Signed: 11/26/2016 3:16:27 PM By: Christin Fudge MD, FACS Entered By: Christin Fudge on 11/26/2016 13:59:27 Novick, [name] ([id number]) -------------------------------------------------------------------------------- HPI Details Patient Name: [name], [name] 11/26/2016 1:30 Date of Service: PM Medical Record [id number] Number: Patient Account Number: [id number] Date of Birth/Sex: 1950-12-15 (66 y.o. Male) Treating RN: Elyn Peers, Other Clinician: Primary Care Provider: Olivia Mackie Treating [name], Cherie Ouch, Provider/Extender: Referring Provider: Harless Nakayama in Treatment: 9 History of Present Illness Location: bilateral feet ulceration on the toes Quality: Patient reports experiencing a dull pain to affected area(s). Severity: Patient states wound are getting better Duration: Patient has had the wound for > 3 months prior to seeking treatment at the wound center Timing: Pain in wound is constant (hurts all the time) Context: The wound would happen gradually Modifying Factors: Other treatment(s) tried include:treatment for lymphedema and is seen by the podiatrist Dr. Caryl Comes Associated Signs and Symptoms: Patient reports having increase swelling. HPI Description: 66 year old patient here to see as for bilateral feet ulceration to on his left first  and second toe and 2 on his right first and second toe, which she's had for about 4 months. 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 Donella Stade. The patient is also undergoing workup by medical oncology for a intra-abdominal lymphadenopathy. 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 He is also status post appendectomy, inguinal lymph node biopsy, rectal biopsy and rectal examination under anesthesia. he currently smokes cigarettes about half packet a day. In March of this year he was seen by Dr. Hortencia Pilar, for evaluation of bilateral varicose veins and besides wearing compression stockings he had recommended laser ablation of the right and left great saphenous veins to eleviate the symptoms and complications of severe superficial venous reflux disease. He also recommended lymphedema pumps for better control of his lymphedema. The patient recently has had on 08/23/2016, right greater saphenous vein ablation with the laser energy Earlier lower extremity venous reflux examination done on 05/08/2016 showed no DVT or SVT both lower legs but incompetence of bilateral great saphenous veins was present. A lower arterial study was also done and there was no significant right lower and left lower extremity problems based on a normal toe brachial index bilaterally and the ABI was 1.221 the left and 1.23 on the right. His post ablation venous duplex examination showed successful ablation of the right GS vein with thrombus formation 2 below the right saphenofemoral junction. The deep system was patent without evidence of thrombosis and this was done on 08/30/2016. the patient also has a squamous cell cancer of the skin of the buttock and is recently  undergone radiation therapy for this prior to excisional surgery. Addendum: regarding his x-rays done today and x-ray of the left foot -- IMPRESSION: No objective [name], [name] ([id number]) evidence of osteomyelitis.  There are soft tissue changes which may reflect cellulitis. X-ray of the right foot -- IMPRESSION:Findings compatible with cellulitis of the toes. No objective evidence of osteomyelitis is observed. 10/01/16 on evaluation today patient's wounds appeared to be doing some better. I did review the x-rays as well which showed no evidence of osteomyelitis although there was evidence on x-ray of cellulitis. He fortunately is not having any discomfort although he continues to have some swelling. He does not remember being on any antibiotics recently. 10/15/16 on evaluation today patient's wounds overall appear to be doing better although he does have a new location noted on the left foot. Fortunately he is not having significant pain. It almost has the appearance that something is rubbing on the end of his toes but he wears the open toe shoes and according to what he is telling me never wears anything that would rub on his foot. There is no evidence of infection and specifically no evidence of a fungal infection 10/22/16 On evaluation today patient's wounds appeared to be doing better compared to last week in regard to his bilateral lower extremities. Fortunately I happy with how things are progressing although he still has ulcers I feel like that he is improving and appropriate manner. 11/12/16 on evaluation today patient appears to be doing well in regard to his bilateral feet and the respective wounds. We have been using surrounding her dressings along with an antifungal cream which seems to be doing very well. He has no bilateral dysfunction noticed that the rituals are weight loss at this point. He also has no nausea or vomiting a note purulent discharge. He did see Vein and vascular  today and he tells me that they told him he could have surgery for his venous stasis but they did not feel like it was worth it in his words. Fortunately patient's wounds do appear to be getting sneakily better. 11/26/2016 -- he says he is going to have some surgery during this week at [facility] for possibly a colon resection Electronic Signature(s) Signed: 11/26/2016 3:16:27 PM By: Christin Fudge MD, FACS Entered By: Christin Fudge on 11/26/2016 14:00:22 [name], [name] ([id number]) -------------------------------------------------------------------------------- Otelia Sergeant TISS Details Patient Name: [name], [name] 11/26/2016 1:30 Date of Service: PM Medical Record [id number] Number: Patient Account Number: [id number] Date of Birth/Sex: 12-09-1950 (66 y.o. Male) Treating RN: Elyn Peers, Other Clinician: Primary Care Provider: Olivia Mackie Treating Melika Reder, Cherie Ouch, Provider/Extender: Referring Provider: Harless Nakayama in Treatment: 9 Procedure Performed for: Wound #6 Left Toe Second Performed By: Physician Christin Fudge, MD Post Procedure Diagnosis Same as Pre-procedure Electronic Signature(s) Signed: 11/26/2016 2:16:41 PM By: Montey Hora Entered By: Montey Hora on 11/26/2016 14:16:41 [name], [name] ([id number]) -------------------------------------------------------------------------------- Physical Exam Details Patient Name: [name], [name] 11/26/2016 1:30 Date of Service: PM Medical Record [id number] Number: Patient Account Number: [id number] Date of Birth/Sex: 10-20-1950 (66 y.o. Male) Treating RN: Elyn Peers, Other Clinician: Primary Care Provider: Olivia Mackie Treating Mirca Yale, Cherie Ouch, Provider/Extender: Referring Provider: Harless Nakayama in Treatment: 9 Constitutional . Pulse regular. Respirations normal and unlabored. Afebrile. . Eyes Nonicteric. Reactive to light. Ears, Nose, Mouth, and  Throat Lips, teeth, and gums WNL.Marland Kitchen Moist mucosa without lesions. Neck supple and nontender. No palpable supraclavicular or cervical adenopathy. Normal sized without goiter. Respiratory WNL. No retractions.. Cardiovascular Pedal Pulses WNL. No clubbing, cyanosis or edema. Chest Breasts symmetical and no nipple discharge.. Breast tissue WNL, no masses, lumps, or tenderness.. Lymphatic No adneopathy. No adenopathy. No adenopathy. Musculoskeletal Adexa without  tenderness or enlargement.. Digits and nails w/o clubbing, cyanosis, infection, petechiae, ischemia, or inflammatory conditions.. Integumentary (Hair, Skin) No suspicious lesions. No crepitus or fluctuance. No peri-wound warmth or erythema. No masses.Marland Kitchen Psychiatric Judgement and insight Intact.. No evidence of depression, anxiety, or agitation.. Notes the wounds are looking better with healthy granulation tissue and some areas some hyper granulation. Fungal infection is also looking better. No sharp debridement was required today. Electronic Signature(s) Signed: 11/26/2016 3:16:27 PM By: Christin Fudge MD, FACS Entered By: Christin Fudge on 11/26/2016 14:01:03 [name], [name] ([id number]) -------------------------------------------------------------------------------- Physician Orders Details Patient Name: [name], [name] 11/26/2016 1:30 Date of Service: PM Medical Record [id number] Number: Patient Account Number: [id number] Date of Birth/Sex: 16-Mar-1951 (66 y.o. Male) Treating RN: Elyn Peers, Other Clinician: Primary Care Provider: Olivia Mackie Treating Angelino Rumery, Cherie Ouch, Provider/Extender: Referring Provider: Harless Nakayama in Treatment: 9 Verbal / Phone Orders: No Diagnosis Coding Wound Cleansing Wound #1 Right Toe Great o Cleanse wound with mild soap and water o May Shower, gently pat wound dry prior to applying new dressing. Wound #2 Right Toe Second o Cleanse wound with mild soap and  water o May Shower, gently pat wound dry prior to applying new dressing. Wound #3 Right Toe Third o Cleanse wound with mild soap and water o May Shower, gently pat wound dry prior to applying new dressing. Wound #4 Right,Dorsal Foot o Cleanse wound with mild soap and water o May Shower, gently pat wound dry prior to applying new dressing. Wound #5 Left Toe Great o Cleanse wound with mild soap and water o May Shower, gently pat wound dry prior to applying new dressing. Wound #6 Left Toe Second o Cleanse wound with mild soap and water o May Shower, gently pat wound dry prior to applying new dressing. Wound #7 Left Toe Third o Cleanse wound with mild soap and water o May Shower, gently pat wound dry prior to applying new dressing. Anesthetic Wound #1 Right Toe Great o Topical Lidocaine 4% cream applied to wound bed prior to debridement Wound #2 Right Toe Second o Topical Lidocaine 4% cream applied to wound bed prior to debridement Quadros, Aldean E. ([id number]) Wound #3 Right Toe Third o Topical Lidocaine 4% cream applied to wound bed prior to debridement Wound #4 Right,Dorsal Foot o Topical Lidocaine 4% cream applied to wound bed prior to debridement Wound #5 Left Toe Great o Topical Lidocaine 4% cream applied to wound bed prior to debridement Wound #6 Left Toe Second o Topical Lidocaine 4% cream applied to wound bed prior to debridement Wound #7 Left Toe Third o Topical Lidocaine 4% cream applied to wound bed prior to debridement Skin Barriers/Peri-Wound Care Wound #1 Right Toe Great o Antifungal cream - on and between toes then AqAg over areas Wound #2 Right Toe Second o Antifungal cream - on and between toes then AqAg over areas Wound #3 Right Toe Third o Antifungal cream - on and between toes then AqAg over areas Wound #4 Right,Dorsal Foot o Antifungal cream - on and between toes then AqAg over areas Wound #5 Left Toe Great o  Antifungal cream - on and between toes then AqAg over areas Wound #6 Left Toe Second o Antifungal cream - on and between toes then AqAg over areas Wound #7 Left Toe Third o Antifungal cream - on and between toes then AqAg over areas Primary Wound Dressing Wound #1 Right Toe Great o Aquacel Ag Wound #2 Right Toe Second o Aquacel Ag Wound #3 Right Toe Third   o Aquacel Ag Wound #4 Right,Dorsal Foot Marzette, Salvatore E. ([id number]) o Aquacel Ag Wound #5 Left Toe Great o Aquacel Ag Wound #6 Left Toe Second o Aquacel Ag Wound #7 Left Toe Third o Aquacel Ag Secondary Dressing Wound #1 Right Toe Great o ABD pad o Gauze and Kerlix/Conform Wound #2 Right Toe Second o ABD pad o Gauze and Kerlix/Conform Wound #3 Right Toe Third o ABD pad o Gauze and Kerlix/Conform Wound #4 Right,Dorsal Foot o ABD pad o Gauze and Kerlix/Conform Wound #5 Left Toe Great o ABD pad o Gauze and Kerlix/Conform Wound #6 Left Toe Second o ABD pad o Gauze and Kerlix/Conform Wound #7 Left Toe Third o ABD pad o Gauze and Kerlix/Conform Dressing Change Frequency Wound #1 Right Toe Great o Change dressing every day. Wound #2 Right Toe Second o Change dressing every day. Wound #3 Right Toe Third Myer, Salvadore E. ([id number]) o Change dressing every day. Wound #4 Right,Dorsal Foot o Change dressing every day. Wound #5 Left Toe Great o Change dressing every day. Wound #6 Left Toe Second o Change dressing every day. Wound #7 Left Toe Third o Change dressing every day. Follow-up Appointments Wound #1 Right Toe Great o Return Appointment in 1 week. Wound #2 Right Toe Second o Return Appointment in 1 week. Wound #3 Right Toe Third o Return Appointment in 1 week. Wound #4 Right,Dorsal Foot o Return Appointment in 1 week. Wound #5 Left Toe Great o Return Appointment in 1 week. Wound #6 Left Toe Second o Return Appointment in 1  week. Wound #7 Left Toe Third o Return Appointment in 1 week. Edema Control Wound #1 Right Toe Great o Patient to wear own compression stockings o Elevate legs to the level of the heart and pump ankles as often as possible Wound #2 Right Toe Second o Patient to wear own compression stockings o Elevate legs to the level of the heart and pump ankles as often as possible Wound #3 Right Toe Third o Patient to wear own compression stockings o Elevate legs to the level of the heart and pump ankles as often as possible Leffel, Taiven E. ([id number]) Wound #4 Right,Dorsal Foot o Patient to wear own compression stockings o Elevate legs to the level of the heart and pump ankles as often as possible Wound #5 Left Toe Great o Patient to wear own compression stockings o Elevate legs to the level of the heart and pump ankles as often as possible Wound #6 Left Toe Second o Patient to wear own compression stockings o Elevate legs to the level of the heart and pump ankles as often as possible Wound #7 Left Toe Third o Patient to wear own compression stockings o Elevate legs to the level of the heart and pump ankles as often as possible Additional Orders / Instructions Wound #1 Right Toe Great o Stop Smoking - Please continue to stay away from alcohol o Other: - Please add vitamin A, vitamin C, and Zinc supplements to your diet Wound #2 Right Toe Second o Stop Smoking - Please continue to stay away from alcohol o Other: - Please add vitamin A, vitamin C, and Zinc supplements to your diet Wound #3 Right Toe Third o Stop Smoking - Please continue to stay away from alcohol o Other: - Please add vitamin A, vitamin C, and Zinc supplements to your diet Wound #4 Right,Dorsal Foot o Stop Smoking - Please continue to stay away from alcohol o Other: - Please add vitamin A, vitamin C,  and Zinc supplements to your diet Wound #5 Left Toe Great o Stop Smoking -  Please continue to stay away from alcohol o Other: - Please add vitamin A, vitamin C, and Zinc supplements to your diet Wound #6 Left Toe Second o Stop Smoking - Please continue to stay away from alcohol o Other: - Please add vitamin A, vitamin C, and Zinc supplements to your diet Wound #7 Left Toe Third o Stop Smoking - Please continue to stay away from alcohol o Other: - Please add vitamin A, vitamin C, and Zinc supplements to your diet Home Health Wound #1 Right Toe TRAIVON, MORRICAL ([id number]) o D/C Home Health Services - per patient request Wound #2 Right Toe Second o D/C Home Health Services - per patient request Wound #3 Right Toe Third o D/C Home Health Services - per patient request Wound #4 Right,Dorsal Foot o D/C Home Health Services - per patient request Wound #5 Left Toe Great o D/C Home Health Services - per patient request Wound #6 Left Toe Second o D/C Home Health Services - per patient request Wound #7 Left Toe Third o D/C Home Health Services - per patient request Patient Medications Allergies: No Known Drug Allergies Notifications Medication Indication Start End clotrimazole-betamethasone 11/26/2016 DOSE topical 1 %-0.05 % cream - cream topical as directed Electronic Signature(s) Signed: 11/26/2016 1:58:16 PM By: Christin Fudge MD, FACS Entered By: Christin Fudge on 11/26/2016 13:58:10 Yaeger, [name] ([id number]) -------------------------------------------------------------------------------- Problem List Details Patient Name: [name], [name] 11/26/2016 1:30 Date of Service: PM Medical Record [id number] Number: Patient Account Number: [id number] Date of Birth/Sex: 11/22/1950 (66 y.o. Male) Treating RN: Elyn Peers, Other Clinician: Primary Care Provider: Olivia Mackie Treating Syretta Kochel, Cherie Ouch, Provider/Extender: Referring Provider: Harless Nakayama in Treatment: 9 Active Problems ICD-10 Encounter Code  Description Active Date Diagnosis E11.621 Type 2 diabetes mellitus with foot ulcer 09/21/2016 Yes I87.313 Chronic venous hypertension (idiopathic) with ulcer of 09/21/2016 Yes bilateral lower extremity I89.0 Lymphedema, not elsewhere classified 09/21/2016 Yes L97.522 Non-pressure chronic ulcer of other part of left foot with fat 09/21/2016 Yes layer exposed L97.512 Non-pressure chronic ulcer of other part of right foot with 09/21/2016 Yes fat layer exposed F17.218 Nicotine dependence, cigarettes, 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 11/26/2016 1:30 Date of Service: PM Medical Record [id number] Number: Patient Account Number: [id number] Date of Birth/Sex: 1950/09/21 (66 y.o. Male) Treating RN: Elyn Peers, Other Clinician: Primary Care Provider: Olivia Mackie Treating Jairus Tonne, Cherie Ouch, Provider/Extender: Referring Provider: Harless Nakayama in Treatment: 9 Subjective Chief Complaint Information obtained from Patient Patients presents for treatment of an open diabetic ulcer to both feet History of Present Illness (HPI) The following HPI elements were documented for the patient's wound: Location: bilateral feet ulceration on the toes Quality: Patient reports experiencing a dull pain to affected area(s). Severity: Patient states wound are getting better Duration: Patient has had the wound for > 3 months prior to seeking treatment at the wound center Timing: Pain in wound is constant (hurts all the time) Context: The wound  would happen gradually Modifying Factors: Other treatment(s) tried include:treatment for lymphedema and is seen by the podiatrist Dr. Caryl Comes Associated Signs and Symptoms: Patient reports having increase swelling. 66 year old patient here to  see as for bilateral feet ulceration to on his left first and second toe and 2 on his right first and second toe, which she's had for about 4 months. 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 Donella Stade. The patient is also undergoing workup by medical oncology for a intra- abdominal lymphadenopathy. 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 He is also status post appendectomy, inguinal lymph node biopsy, rectal biopsy and rectal examination under anesthesia. he currently smokes cigarettes about half packet a day. In March of this year he was seen by Dr. Hortencia Pilar, for evaluation of bilateral varicose veins and besides wearing compression stockings he had recommended laser ablation of the right and left great saphenous veins to eleviate the symptoms and complications of severe superficial venous reflux disease. He also recommended lymphedema pumps for better control of his lymphedema. The patient recently has had on 08/23/2016, right greater saphenous vein ablation with the laser energy Earlier lower extremity venous reflux examination done on 05/08/2016 showed no DVT or SVT both lower legs but incompetence of bilateral great saphenous veins was present. A lower arterial study was also done and there was no significant right lower and left lower extremity problems based on a normal toe brachial index bilaterally and the ABI was 1.221 the left and 1.23 on the Langbehn, Antwuan E. ([id number]) right. His post  ablation venous duplex examination showed successful ablation of the right GS vein with thrombus formation 2 below the right saphenofemoral junction. The deep system was patent without evidence of thrombosis and this was done on 08/30/2016. 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 Addendum: regarding his x-rays done today and x-ray of the left foot -- IMPRESSION: No objective evidence of osteomyelitis. There are soft tissue changes which may reflect cellulitis. X-ray of the right foot -- IMPRESSION:Findings compatible with cellulitis of the toes. No objective evidence of osteomyelitis is observed. 10/01/16 on evaluation today patient's wounds appeared to be doing some better. I did review the x-rays as well which showed no evidence of osteomyelitis although there was evidence on x-ray of cellulitis. He fortunately is not having any discomfort although he continues to have some swelling. He does not remember being on any antibiotics recently. 10/15/16 on evaluation today patient's wounds overall appear to be doing better although he does have a new location noted on the left foot. Fortunately he is not having significant pain. It almost has the appearance that something is rubbing on the end of his toes but he wears the open toe shoes and according to what he is telling me never wears anything that would rub on his foot. There is no evidence of infection and specifically no evidence of a fungal infection 10/22/16 On evaluation today patient's wounds appeared to be doing better compared to last week in regard to his bilateral lower extremities. Fortunately I happy with how things are progressing although he still has ulcers I feel like that he is improving and appropriate manner. 11/12/16 on evaluation today patient appears to be doing well in regard to his bilateral feet and the  respective wounds. We have been using surrounding her dressings along with an antifungal  cream which seems to be doing very well. He has no bilateral dysfunction noticed that the rituals are weight loss at this point. He also has no nausea or vomiting a note purulent discharge. He did see Vein and vascular today and he tells me that they told him he could have surgery for his venous stasis but they did not feel like it was worth it in his words. Fortunately patient's wounds do appear to be getting sneakily better. 11/26/2016 -- he says he is going to have some surgery during this week at [facility] for possibly a colon resection Objective Constitutional Pulse regular. Respirations normal and unlabored. Afebrile. Vitals Time Taken: 1:35 PM, Height: 69 in, Weight: 168 lbs, BMI: 24.8, Temperature: 98.4 F, Pulse: 61 [name], [name] ([id number]) bpm, Respiratory Rate: 16 breaths/min, Blood Pressure: 154/70 mmHg. Eyes Nonicteric. Reactive to light. Ears, Nose, Mouth, and Throat Lips, teeth, and gums WNL.Marland Kitchen Moist mucosa without lesions. Neck supple and nontender. No palpable supraclavicular or cervical adenopathy. Normal sized without goiter. Respiratory WNL. No retractions.. Cardiovascular Pedal Pulses WNL. No clubbing, cyanosis or edema. Chest Breasts symmetical and no nipple discharge.. Breast tissue WNL, no masses, lumps, or tenderness.. Lymphatic No adneopathy. No adenopathy. No adenopathy. Musculoskeletal Adexa without tenderness or enlargement.. Digits and nails w/o clubbing, cyanosis, infection, petechiae, ischemia, or inflammatory conditions.Marland Kitchen Psychiatric Judgement and insight Intact.. No evidence of depression, anxiety, or agitation.. General Notes: the wounds are looking better with healthy granulation tissue and some areas some hyper granulation. Fungal infection is also looking better. No sharp debridement was required today. Integumentary (Hair, Skin) No suspicious  lesions. No crepitus or fluctuance. No peri-wound warmth or erythema. No masses.. Wound #1 status is Open. Original cause of wound was Gradually Appeared. The wound is located on the Right Toe Great. The wound measures 2.5cm length x 4.5cm width x 0.2cm depth; 8.836cm^2 area and 1.767cm^3 volume. There is Fat Layer (Subcutaneous Tissue) Exposed exposed. There is no tunneling or undermining noted. There is a large amount of serosanguineous drainage noted. The wound margin is flat and intact. There is large (67-100%) pink granulation within the wound bed. There is no necrotic tissue within the wound bed. The periwound skin appearance exhibited: Excoriation, Maceration. The periwound skin appearance did not exhibit: Callus, Crepitus, Induration, Rash, Scarring, Dry/Scaly, Atrophie Blanche, Cyanosis, Ecchymosis, Hemosiderin Staining, Mottled, Pallor, Rubor, Erythema. Periwound temperature was noted as No Abnormality. Wound #2 status is Open. Original cause of wound was Gradually Appeared. The wound is located on the Right Toe Second. The wound measures 0.6cm length x 0.5cm width x 0.1cm depth; 0.236cm^2 area and 0.024cm^3 volume. There is Fat Layer (Subcutaneous Tissue) Exposed exposed. There is no tunneling or Lover, Alika E. ([id number]) undermining noted. There is a large amount of serosanguineous drainage noted. The wound margin is flat and intact. There is large (67-100%) pink granulation within the wound bed. There is no necrotic tissue within the wound bed. The periwound skin appearance exhibited: Excoriation, Maceration. The periwound skin appearance did not exhibit: Callus, Crepitus, Induration, Rash, Scarring, Dry/Scaly, Atrophie Blanche, Cyanosis, Ecchymosis, Hemosiderin Staining, Mottled, Pallor, Rubor, Erythema. Periwound temperature was noted as No Abnormality. Wound #3 status is Open. Original cause of wound was Gradually Appeared. The wound is located on the Right Toe Third. The wound  measures 0.1cm length x 0.1cm width x 0.1cm depth; 0.008cm^2 area and 0.001cm^3 volume. There is Fat Layer (Subcutaneous Tissue) Exposed exposed. There is no tunneling or undermining noted. There is a large amount of serous drainage  noted. The wound margin is flat and intact. There is large (67-100%) pink granulation within the wound bed. There is no necrotic tissue within the wound bed. The periwound skin appearance exhibited: Excoriation. The periwound skin appearance did not exhibit: Callus, Crepitus, Induration, Rash, Scarring, Dry/Scaly, Maceration, Atrophie Blanche, Cyanosis, Ecchymosis, Hemosiderin Staining, Mottled, Pallor, Rubor, Erythema. Periwound temperature was noted as No Abnormality. Wound #4 status is Open. Original cause of wound was Gradually Appeared. The wound is located on the Right,Dorsal Foot. The wound measures 0.6cm length x 0.7cm width x 0.1cm depth; 0.33cm^2 area and 0.033cm^3 volume. There is no tunneling or undermining noted. There is a large amount of serous drainage noted. The wound margin is distinct with the outline attached to the wound base. There is large (67-100%) red granulation within the wound bed. There is no necrotic tissue within the wound bed. The periwound skin appearance exhibited: Maceration. Periwound temperature was noted as No Abnormality. Wound #5 status is Open. Original cause of wound was Gradually Appeared. The wound is located on the Left Toe Great. The wound measures 1.3cm length x 1.4cm width x 0.1cm depth; 1.429cm^2 area and 0.143cm^3 volume. There is Fat Layer (Subcutaneous Tissue) Exposed exposed. There is no tunneling or undermining noted. There is a large amount of serous drainage noted. The wound margin is flat and intact. There is large (67-100%) pink granulation within the wound bed. There is no necrotic tissue within the wound bed. The periwound skin appearance exhibited: Excoriation. The periwound skin appearance did not exhibit:  Callus, Crepitus, Induration, Rash, Scarring, Dry/Scaly, Maceration, Atrophie Blanche, Cyanosis, Ecchymosis, Hemosiderin Staining, Mottled, Pallor, Rubor, Erythema. Periwound temperature was noted as No Abnormality. Wound #6 status is Open. Original cause of wound was Gradually Appeared. The wound is located on the Left Toe Second. The wound measures 0.6cm length x 0.7cm width x 0.1cm depth; 0.33cm^2 area and 0.033cm^3 volume. There is Fat Layer (Subcutaneous Tissue) Exposed exposed. There is no tunneling or undermining noted. There is a large amount of serous drainage noted. The wound margin is flat and intact. There is large (67-100%) pink granulation within the wound bed. There is no necrotic tissue within the wound bed. The periwound skin appearance exhibited: Excoriation, Maceration. The periwound skin appearance did not exhibit: Callus, Crepitus, Induration, Rash, Scarring, Dry/Scaly, Atrophie Blanche, Cyanosis, Ecchymosis, Hemosiderin Staining, Mottled, Pallor, Rubor, Erythema. Periwound temperature was noted as No Abnormality. Wound #7 status is Open. Original cause of wound was Gradually Appeared. The wound is located on the Left Toe Third. The wound measures 0.1cm length x 0.1cm width x 0.1cm depth; 0.008cm^2 area and 0.001cm^3 volume. There is Fat Layer (Subcutaneous Tissue) Exposed exposed. There is no tunneling or undermining noted. There is a large amount of serous drainage noted. The wound margin is flat and intact. There is large (67-100%) red granulation within the wound bed. There is no necrotic tissue within the wound bed. The periwound skin appearance exhibited: Excoriation. The periwound skin appearance did not exhibit: Callus, Crepitus, Induration, Rash, Scarring, Dry/Scaly, Maceration, Atrophie Blanche, Cyanosis, Igoe, Zylan E. ([id number]) Ecchymosis, Hemosiderin Staining, Mottled, Pallor, Rubor, Erythema. Periwound temperature was noted as No  Abnormality. Assessment Active Problems ICD-10 E11.621 - Type 2 diabetes mellitus with foot ulcer I87.313 - Chronic venous hypertension (idiopathic) with ulcer of bilateral lower extremity I89.0 - Lymphedema, not elsewhere classified L97.522 - Non-pressure chronic ulcer of other part of left foot with fat layer exposed L97.512 - Non-pressure chronic ulcer of other part of right foot with fat layer exposed  F17.218 - Nicotine dependence, cigarettes, with other nicotine-induced disorders F10.19 - Alcohol abuse with unspecified alcohol-induced disorder Procedures Wound #6 Pre-procedure diagnosis of Wound #6 is a Diabetic Wound/Ulcer of the Lower Extremity located on the Left Toe Second . An CHEM CAUT GRANULATION TISS procedure was performed by Christin Fudge, MD. Post procedure Diagnosis Wound #6: Same as Pre-Procedure Plan Wound Cleansing: Wound #1 Right Toe Great: Cleanse wound with mild soap and water May Shower, gently pat wound dry prior to applying new dressing. Wound #2 Right Toe Second: Cleanse wound with mild soap and water May Shower, gently pat wound dry prior to applying new dressing. Wound #3 Right Toe Third: Cleanse wound with mild soap and water May Shower, gently pat wound dry prior to applying new dressing. Wound #4 Right,Dorsal Foot: Woollard, Sina E. ([id number]) Cleanse wound with mild soap and water May Shower, gently pat wound dry prior to applying new dressing. Wound #5 Left Toe Great: Cleanse wound with mild soap and water May Shower, gently pat wound dry prior to applying new dressing. Wound #6 Left Toe Second: Cleanse wound with mild soap and water May Shower, gently pat wound dry prior to applying new dressing. Wound #7 Left Toe Third: Cleanse wound with mild soap and water May Shower, gently pat wound dry prior to applying new dressing. Anesthetic: Wound #1 Right Toe Great: Topical Lidocaine 4% cream applied to wound bed prior to debridement Wound #2  Right Toe Second: Topical Lidocaine 4% cream applied to wound bed prior to debridement Wound #3 Right Toe Third: Topical Lidocaine 4% cream applied to wound bed prior to debridement Wound #4 Right,Dorsal Foot: Topical Lidocaine 4% cream applied to wound bed prior to debridement Wound #5 Left Toe Great: Topical Lidocaine 4% cream applied to wound bed prior to debridement Wound #6 Left Toe Second: Topical Lidocaine 4% cream applied to wound bed prior to debridement Wound #7 Left Toe Third: Topical Lidocaine 4% cream applied to wound bed prior to debridement Skin Barriers/Peri-Wound Care: Wound #1 Right Toe Great: Antifungal cream - on and between toes then AqAg over areas Wound #2 Right Toe Second: Antifungal cream - on and between toes then AqAg over areas Wound #3 Right Toe Third: Antifungal cream - on and between toes then AqAg over areas Wound #4 Right,Dorsal Foot: Antifungal cream - on and between toes then AqAg over areas Wound #5 Left Toe Great: Antifungal cream - on and between toes then AqAg over areas Wound #6 Left Toe Second: Antifungal cream - on and between toes then AqAg over areas Wound #7 Left Toe Third: Antifungal cream - on and between toes then AqAg over areas Primary Wound Dressing: Wound #1 Right Toe Great: Aquacel Ag Wound #2 Right Toe Second: Aquacel Ag Wound #3 Right Toe Third: Aquacel Ag Wound #4 Right,Dorsal Foot: Aquacel Ag Wound #5 Left Toe GreatDEJOUR, VOS ([id number]) Aquacel Ag Wound #6 Left Toe Second: Aquacel Ag Wound #7 Left Toe Third: Aquacel Ag Secondary Dressing: Wound #1 Right Toe Great: ABD pad Gauze and Kerlix/Conform Wound #2 Right Toe Second: ABD pad Gauze and Kerlix/Conform Wound #3 Right Toe Third: ABD pad Gauze and Kerlix/Conform Wound #4 Right,Dorsal Foot: ABD pad Gauze and Kerlix/Conform Wound #5 Left Toe Great: ABD pad Gauze and Kerlix/Conform Wound #6 Left Toe Second: ABD pad Gauze and  Kerlix/Conform Wound #7 Left Toe Third: ABD pad Gauze and Kerlix/Conform Dressing Change Frequency: Wound #1 Right Toe Great: Change dressing every day. Wound #2 Right Toe Second: Change dressing  every day. Wound #3 Right Toe Third: Change dressing every day. Wound #4 Right,Dorsal Foot: Change dressing every day. Wound #5 Left Toe Great: Change dressing every day. Wound #6 Left Toe Second: Change dressing every day. Wound #7 Left Toe Third: Change dressing every day. Follow-up Appointments: Wound #1 Right Toe Great: Return Appointment in 1 week. Wound #2 Right Toe Second: Return Appointment in 1 week. Wound #3 Right Toe Third: Return Appointment in 1 week. Wound #4 Right,Dorsal Foot: Return Appointment in 1 week. MUHSIN, DORIS ([id number]) Wound #5 Left Toe Great: Return Appointment in 1 week. Wound #6 Left Toe Second: Return Appointment in 1 week. Wound #7 Left Toe Third: Return Appointment in 1 week. Edema Control: Wound #1 Right Toe Great: Patient to wear own compression stockings Elevate legs to the level of the heart and pump ankles as often as possible Wound #2 Right Toe Second: Patient to wear own compression stockings Elevate legs to the level of the heart and pump ankles as often as possible Wound #3 Right Toe Third: Patient to wear own compression stockings Elevate legs to the level of the heart and pump ankles as often as possible Wound #4 Right,Dorsal Foot: Patient to wear own compression stockings Elevate legs to the level of the heart and pump ankles as often as possible Wound #5 Left Toe Great: Patient to wear own compression stockings Elevate legs to the level of the heart and pump ankles as often as possible Wound #6 Left Toe Second: Patient to wear own compression stockings Elevate legs to the level of the heart and pump ankles as often as possible Wound #7 Left Toe Third: Patient to wear own compression stockings Elevate legs to the level of  the heart and pump ankles as often as possible Additional Orders / Instructions: Wound #1 Right Toe Great: Stop Smoking - Please continue to stay away from alcohol Other: - Please add vitamin A, vitamin C, and Zinc supplements to your diet Wound #2 Right Toe Second: Stop Smoking - Please continue to stay away from alcohol Other: - Please add vitamin A, vitamin C, and Zinc supplements to your diet Wound #3 Right Toe Third: Stop Smoking - Please continue to stay away from alcohol Other: - Please add vitamin A, vitamin C, and Zinc supplements to your diet Wound #4 Right,Dorsal Foot: Stop Smoking - Please continue to stay away from alcohol Other: - Please add vitamin A, vitamin C, and Zinc supplements to your diet Wound #5 Left Toe Great: Stop Smoking - Please continue to stay away from alcohol Other: - Please add vitamin A, vitamin C, and Zinc supplements to your diet Wound #6 Left Toe Second: Stop Smoking - Please continue to stay away from alcohol Other: - Please add vitamin A, vitamin C, and Zinc supplements to your diet Wound #7 Left Toe Third: Stop Smoking - Please continue to stay away from alcohol Other: - Please add vitamin A, vitamin C, and Zinc supplements to your diet Home Health: JAMARRION, BUDAI ([id number]) Wound #1 Right Toe Great: D/C [place] - per patient request Wound #2 Right Toe Second: D/C [facility] - per patient request Wound #3 Right Toe Third: D/C [place] - per patient request Wound #4 Right,Dorsal Foot: D/C [place] - per patient request Wound #5 Left Toe Great: D/C [place] - per patient request Wound #6 Left Toe Second: D/C [place] - per patient request Wound #7 Left Toe Third: D/C  Home Health Services - per patient request The following medication(s) was prescribed: clotrimazole-betamethasone topical 1 %-0.05 % cream cream topical as directed starting 11/26/2016 after review today  I have recommended: 1. Silver alginate to be applied over his wounds, and change daily after washing with soap and water. 2. Lotrisone ointment to be applied liberally to his toes and interdigital areas 3. Good control of his diabetes mellitus 4. the need to completely give up smoking discussed with him in great detail 5. regular visits the wound center Electronic Signature(s) Signed: 11/26/2016 4:20:11 PM By: Christin Fudge MD, FACS Previous Signature: 11/26/2016 3:16:27 PM Version By: Christin Fudge MD, FACS Entered By: Christin Fudge on 11/26/2016 15:17:37 [place], [name] ([id number]) -------------------------------------------------------------------------------- SuperBill Details Patient Name: Stai, Eldo E. Date of Service: 11/26/2016 Medical Record Number: [id number] Patient Account Number: [id number] Date of Birth/Sex: 1951/02/24 (66 y.o. Male) Treating RN: Montey Hora Primary Care Provider: Myrtie Hawk Other Clinician: Referring Provider: Myrtie Hawk Treating Provider/Extender: Frann Rider in Treatment: 9 Diagnosis Coding ICD-10 Codes Code Description E11.621 Type 2 diabetes mellitus with foot ulcer I87.313 Chronic venous hypertension (idiopathic) with ulcer of bilateral lower extremity I89.0 Lymphedema, not elsewhere classified L97.522 Non-pressure chronic ulcer of other part of left foot with fat layer exposed L97.512 Non-pressure chronic ulcer of other part of right foot with fat layer exposed F17.218 Nicotine dependence, cigarettes, 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 abuse with unspecified alcohol-induced disorder Physician Procedures CPT4: Description Modifier Quantity Code [id number] [id number] - WC PHYS LEVEL 3 - EST PT 1 ICD-10 Description Diagnosis E11.621 Type 2 diabetes mellitus with foot ulcer I87.313 Chronic venous hypertension (idiopathic) with ulcer of bilateral lower extremity  I89.0 Lymphedema, not elsewhere classified  L97.522 Non-pressure chronic ulcer of other part of left foot with fat layer exposed Electronic Signature(s) Signed: 11/26/2016 3:16:27 PM By: Christin Fudge MD, FACS Entered By: Christin Fudge on 11/26/2016 14:02:32

## 2016-11-27 NOTE — Progress Notes (Signed)
Radiation Oncology Follow up Note  Name: [name]   Date:   11/27/2016 MRN:  [id number] DOB: [birth year]    This 66 y.o. 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 He does have noted hepatic cirrhosis causing some lower extremity edema he is being treated at the pain clinic for bilateral foot ulcers. He states his buttock region is doing well he specifically denies pain drainage or discomfort..  COMPLICATIONS OF TREATMENT: none  FOLLOW UP COMPLIANCE: keeps appointments   PHYSICAL EXAM:  BP 134/80   Pulse 62   Temp (!) 96.9 F (36.1 C)   Wt 155 lb 13.8 oz (70.7 kg)   BMI 22.69 kg/m  Examination the perianal region shows no evidence of mass or nodularity the lesion is resolved. No other evidence of mass in the buttocks or perianal region is noted. No inguinal adenopathy is identified. Well-developed well-nourished patient in NAD. HEENT reveals PERLA, EOMI, discs not visualized.  Oral cavity is clear. No oral mucosal lesions are identified. Neck is clear without evidence of cervical or supraclavicular adenopathy. Lungs are clear to A&P. Cardiac examination is essentially unremarkable with regular rate and rhythm without murmur rub or thrill. Abdomen is benign with no organomegaly or masses noted. Motor sensory and DTR levels are equal and symmetric in the upper and lower extremities. Cranial nerves II through XII are grossly intact. Proprioception is intact. No peripheral adenopathy or edema is identified. No motor or sensory levels are noted. Crude visual fields are within normal range.  RADIOLOGY RESULTS: No current films for review  PLAN: At the present time patient has done well. I see  no evidence residual disease in this area. I've asked to see him back in 1 year for follow-up. Patient is to call sooner with any concerns.  I would like to take this opportunity to thank you for allowing me to participate in the care of your patient.Armstead Peaks., MD

## 2016-11-28 NOTE — Progress Notes (Signed)
CAIDEN, ARTEAGA ([id number]) Visit Report for 11/26/2016 Arrival Information Details Patient Name: [name], [name] 11/26/2016 1:30 Date of Service: PM Medical Record [id number] Number: Patient Account Number: [id number] Date of Birth/Sex: 11-02-1950 (66 y.o. Male) Treating RN: Elyn Peers, Other Clinician: Primary Care Riki Berninger: Olivia Mackie Treating Britto, Cherie Ouch, Nikitta Sobiech/Extender: Referring Khayden Herzberg: Harless Nakayama in Treatment: 9 Visit Information History Since Last Visit Added or deleted any medications: No Patient Arrived: Ambulatory Any new allergies or adverse reactions: No Arrival Time: 13:34 Had a fall or experienced change in No Accompanied By: brother activities of daily living that may affect Transfer Assistance: None risk of falls: Patient Identification Verified: Yes Signs or symptoms of abuse/neglect since last No Secondary Verification Process Yes visito Completed: Hospitalized since last visit: No Patient Requires Transmission-Based No Has Dressing in Place as Prescribed: Yes Precautions: Pain Present Now: No Patient Has Alerts: Yes Patient Alerts: DMII Electronic Signature(s) Signed: 11/26/2016 4:32:30 PM By: Montey Hora Entered By: Montey Hora on 11/26/2016 13:34:24 [place]mare, [name] ([id number]) -------------------------------------------------------------------------------- Clinic Level of Care Assessment Details Patient Name: [name] 11/26/2016 1:30 Date of Service: PM Medical Record [id number] Number: Patient Account Number: [id number] Date of Birth/Sex: [birth year] (66 y.o. Male) Treating RN: Elyn Peers, Other Clinician: Primary Care Johnell Bas: Olivia Mackie Treating Britto, Cherie Ouch, Tejay Hubert/Extender: Referring Britain Anagnos: Harless Nakayama in Treatment: 9 Clinic Level of Care Assessment Items TOOL 4 Quantity Score []  - Use when only an EandM is performed on FOLLOW-UP visit  0 ASSESSMENTS - Nursing Assessment / Reassessment X - Reassessment of Co-morbidities (includes updates in patient status) 1 10 X - Reassessment of Adherence to Treatment Plan 1 5 ASSESSMENTS - Wound and Skin Assessment / Reassessment []  - Simple Wound Assessment / Reassessment - one wound 0 X - Complex Wound Assessment / Reassessment - multiple wounds 7 5 []  - Dermatologic / Skin Assessment (not related to wound area) 0 ASSESSMENTS - Focused Assessment []  - Circumferential Edema Measurements - multi extremities 0 []  - Nutritional Assessment / Counseling / Intervention 0 X - Lower Extremity Assessment (monofilament, tuning fork, pulses) 1 5 []  - Peripheral Arterial Disease Assessment (using hand held doppler) 0 ASSESSMENTS - Ostomy and/or Continence Assessment and Care []  - Incontinence Assessment and Management 0 []  - Ostomy Care Assessment and Management (repouching, etc.) 0 PROCESS - Coordination of Care X - Simple Patient / Family Education for ongoing care 1 15 []  - Complex (extensive) Patient / Family Education for ongoing care 0 []  - Staff obtains Consents, Records, Test Results / Process Orders 0 Carriker, Starsky E. ([id number]) []  - Staff telephones HHA, Nursing Homes / Clarify orders / etc 0 []  - Routine Transfer to another Facility (non-emergent condition) 0 []  - Routine Hospital Admission (non-emergent condition) 0 []  - New Admissions / Biomedical engineer / Ordering NPWT, Apligraf, etc. 0 []  - Emergency Hospital Admission (emergent condition) 0 X - Simple Discharge Coordination 1 10 []  - Complex (extensive) Discharge Coordination 0 PROCESS - Special Needs []  - Pediatric / Minor Patient Management 0 []  - Isolation Patient Management 0 []  - Hearing / Language / Visual special needs 0 []  - Assessment of Community assistance (transportation, D/C planning, etc.) 0 []  - Additional assistance / Altered mentation 0 []  - Support Surface(s) Assessment (bed, cushion, seat, etc.)  0 INTERVENTIONS - Wound Cleansing / Measurement []  - Simple Wound Cleansing - one wound 0 X - Complex Wound Cleansing - multiple wounds 7 5 X - Wound Imaging (photographs - any number of wounds) 1  5 []  - Wound Tracing (instead of photographs) 0 []  - Simple Wound Measurement - one wound 0 X - Complex Wound Measurement - multiple wounds 7 5 INTERVENTIONS - Wound Dressings X - Small Wound Dressing one or multiple wounds 7 10 []  - Medium Wound Dressing one or multiple wounds 0 []  - Large Wound Dressing one or multiple wounds 0 []  - Application of Medications - topical 0 []  - Application of Medications - injection 0 Mcfarlane, Elery E. ([id number]) INTERVENTIONS - Miscellaneous []  - External ear exam 0 []  - Specimen Collection (cultures, biopsies, blood, body fluids, etc.) 0 []  - Specimen(s) / Culture(s) sent or taken to Lab for analysis 0 []  - Patient Transfer (multiple staff / Harrel Lemon Lift / Similar devices) 0 []  - Simple Staple / Suture removal (25 or less) 0 []  - Complex Staple / Suture removal (26 or more) 0 []  - Hypo / Hyperglycemic Management (close monitor of Blood Glucose) 0 []  - Ankle / Brachial Index (ABI) - do not check if billed separately 0 X - Vital Signs 1 5 Has the patient been seen at the hospital within the last three years: Yes Total Score: 230 Level Of Care: New/Established - Level 5 Electronic Signature(s) Signed: 11/27/2016 5:04:14 PM By: Montey Hora Entered By: Montey Hora on 11/27/2016 13:14:37 Harries, [name] ([id number]) -------------------------------------------------------------------------------- Encounter Discharge Information Details Patient Name: [name], [name] 11/26/2016 1:30 Date of Service: PM Medical Record [id number] Number: Patient Account Number: [id number] Date of Birth/Sex: [birth year] (66 y.o. Male) Treating RN: Elyn Peers, Other Clinician: Primary Care Kashon Kraynak: Olivia Mackie Treating Britto, Cherie Ouch,  Evony Rezek/Extender: Referring Tyliek Timberman: Harless Nakayama in Treatment: 9 Encounter Discharge Information Items Discharge Pain Level: 0 Discharge Condition: Stable Ambulatory Status: Ambulatory Discharge Destination: Home Transportation: Private Auto Accompanied By: brother Schedule Follow-up Appointment: Yes Medication Reconciliation completed and provided to Patient/Care No Lajuane Leatham: Provided on Clinical Summary of Care: 11/26/2016 Form Type Recipient Paper Patient ED Electronic Signature(s) Signed: 11/27/2016 2:51:10 PM By: Ruthine Dose Entered By: Ruthine Dose on 11/26/2016 14:13:27 Gray, [name] ([id number]) -------------------------------------------------------------------------------- Lower Extremity Assessment Details Patient Name: [name], [name] 11/26/2016 1:30 Date of Service: PM Medical Record [id number] Number: Patient Account Number: [id number] Date of Birth/Sex: November 14, 1950 (66 y.o. Male) Treating RN: Elyn Peers, Other Clinician: Primary Care Jamari Moten: Olivia Mackie Treating Britto, Cherie Ouch, Ace Bergfeld/Extender: Referring Lowell Mcgurk: Harless Nakayama in Treatment: 9 Vascular Assessment Pulses: Dorsalis Pedis Palpable: [Left:Yes] [Right:Yes] Posterior Tibial Extremity colors, hair growth, and conditions: Extremity Color: [Left:Hyperpigmented] [Right:Hyperpigmented] Hair Growth on Extremity: [Left:No] [Right:No] Temperature of Extremity: [Left:Warm] [Right:Warm] Capillary Refill: [Left:< 3 seconds] [Right:< 3 seconds] Electronic Signature(s) Signed: 11/26/2016 4:32:30 PM By: Montey Hora Entered By: Montey Hora on 11/26/2016 13:47:26 Win, [name] ([id number]) -------------------------------------------------------------------------------- Multi Wound Chart Details Patient Name: [name] Brittle E. 11/26/2016 1:30 Date of Service: PM Medical Record [id number] Number: Patient Account Number: [id number] Date of Birth/Sex: Feb 02, 1951 (66  y.o. Male) Treating RN: Elyn Peers, Other Clinician: Primary Care Arilla Hice: Olivia Mackie Treating Britto, Cherie Ouch, Artisha Capri/Extender: Referring Elhadji Pecore: Harless Nakayama in Treatment: 9 Vital Signs Height(in): 69 Pulse(bpm): 61 Weight(lbs): 168 Blood Pressure 154/70 (mmHg): Body Mass Index(BMI): 25 Temperature(F): 98.4 Respiratory Rate 16 (breaths/min): Photos: [1:No Photos] [2:No Photos] [3:No Photos] Wound Location: [1:Right Toe Great] [2:Right Toe Second] [3:Right Toe Third] Wounding Event: [1:Gradually Appeared] [2:Gradually Appeared] [3:Gradually Appeared] Primary Etiology: [1:Diabetic Wound/Ulcer of the Lower Extremity] [2:Diabetic Wound/Ulcer of the Lower Extremity] [3:Diabetic Wound/Ulcer of the Lower Extremity] Secondary Etiology: [1:N/A] [2:N/A] [3:N/A] Comorbid History: [1:Anemia, Lymphedema, Congestive Heart Failure,  Hypertension, Peripheral Venous Disease, Type II Diabetes, Neuropathy] [2:Anemia, Lymphedema, Congestive Heart Failure, Hypertension, Peripheral Venous Disease, Type II Diabetes,  Neuropathy] [3:Anemia, Lymphedema, Congestive Heart Failure, Hypertension, Peripheral Venous Disease, Type II Diabetes, Neuropathy] Date Acquired: [1:06/11/2016] [2:06/11/2016] [3:06/11/2016] Weeks of Treatment: [1:9] [2:9] [3:9] Wound Status: [1:Open] [2:Open] [3:Open] Pending Amputation on Yes [2:Yes] [3:Yes] Presentation: Measurements L x W x D 2.5x4.5x0.2 [2:0.6x0.5x0.1] [3:0.1x0.1x0.1] (cm) Area (cm) : [1:8.836] [2:0.236] [3:0.008] Volume (cm) : [1:1.767] [2:0.024] [3:0.001] % Reduction in Area: [1:81.20%] [2:92.30%] [3:98.40%] % Reduction in Volume: 62.50% [2:92.20%] [3:98.00%] Classification: [1:Grade 1] [2:Grade 1] [3:Grade 1] Exudate Amount: [1:Large] [2:Large] [3:Large] Exudate Type: [1:Serosanguineous] [2:Serosanguineous] [3:Serous] Exudate Color: [1:red, brown Yes] [2:red, brown Yes] [3:amber Yes] Foul Odor After Cleansing: Odor  Anticipated Due to No No No Product Use: Wound Margin: Flat and Intact Flat and Intact Flat and Intact Granulation Amount: Large (67-100%) Large (67-100%) Large (67-100%) Granulation Quality: Pink Pink Pink Necrotic Amount: None Present (0%) None Present (0%) None Present (0%) Exposed Structures: Fat Layer (Subcutaneous Fat Layer (Subcutaneous Fat Layer (Subcutaneous Tissue) Exposed: Yes Tissue) Exposed: Yes Tissue) Exposed: Yes Fascia: No Fascia: No Fascia: No Tendon: No Tendon: No Tendon: No Muscle: No Muscle: No Muscle: No Joint: No Joint: No Joint: No Bone: No Bone: No Bone: No Epithelialization: Large (67-100%) Small (1-33%) Small (1-33%) Periwound Skin Texture: Excoriation: Yes Excoriation: Yes Excoriation: Yes Induration: No Induration: No Induration: No Callus: No Callus: No Callus: No Crepitus: No Crepitus: No Crepitus: No Rash: No Rash: No Rash: No Scarring: No Scarring: No Scarring: No Periwound Skin Maceration: Yes Maceration: Yes Maceration: No Moisture: Dry/Scaly: No Dry/Scaly: No Dry/Scaly: No Periwound Skin Color: Atrophie Blanche: No Atrophie Blanche: No Atrophie Blanche: No Cyanosis: No Cyanosis: No Cyanosis: No Ecchymosis: No Ecchymosis: No Ecchymosis: No Erythema: No Erythema: No Erythema: No Hemosiderin Staining: No Hemosiderin Staining: No Hemosiderin Staining: No Mottled: No Mottled: No Mottled: No Pallor: No Pallor: No Pallor: No Rubor: No Rubor: No Rubor: No Temperature: No Abnormality No Abnormality No Abnormality Tenderness on No No No Palpation: Wound Preparation: Ulcer Cleansing: Ulcer Cleansing: Ulcer Cleansing: Rinsed/Irrigated with Rinsed/Irrigated with Rinsed/Irrigated with Saline Saline Saline Topical Anesthetic Topical Anesthetic Topical Anesthetic Applied: None Applied: None Applied: None Wound Number: [id number]  Photos: No Photos No Photos No Photos Wound Location: Right Foot - Dorsal Left Toe Great  Left Toe Second Wounding Event: Gradually Appeared Gradually Appeared Gradually Appeared Primary 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 ([id number]) Anemia, Lymphedema, Anemia, Lymphedema, Anemia, Lymphedema, Congestive Heart Failure, Congestive Heart Failure, Congestive Heart Failure, Hypertension, Peripheral Hypertension, Peripheral Hypertension, Peripheral Venous Disease, Type II Venous Disease, Type II Venous Disease, Type II Diabetes, Neuropathy Diabetes, Neuropathy Diabetes, Neuropathy Date Acquired: 06/11/2016 06/11/2016 06/11/2016 Weeks of Treatment: 9 9 9  Wound Status: Open Open Open Pending Amputation on Yes Yes Yes Presentation: Measurements L x W x D 0.6x0.7x0.1 1.3x1.4x0.1 0.6x0.7x0.1 (cm) Area (cm) : 0.33 1.429 0.33 Volume (cm) : 0.033 0.143 0.033 % Reduction in Area: 92.60% 90.00% 84.60% % Reduction in Volume: 92.60% 90.00% 84.60% Classification: Grade 1 Grade 1 Grade 1 Exudate Amount: Large Large Large Exudate Type: Serous Serous Serous Exudate Color: amber amber amber Foul Odor After Yes Yes Yes Cleansing: Odor Anticipated Due to No No No Product Use: Wound Margin: Distinct, outline attached Flat and Intact Flat and Intact Granulation Amount: Large (67-100%) Large (67-100%) Large (67-100%) Granulation  Quality: Red Pink Pink Necrotic Amount: None Present (0%) None Present (0%) None Present (0%) Exposed Structures: N/A Fat Layer (Subcutaneous Fat Layer (Subcutaneous Tissue) Exposed: Yes Tissue) Exposed: Yes Fascia: No Fascia: No Tendon: No Tendon: No Muscle: No Muscle: No Joint: No Joint: No Bone: No Bone: No Epithelialization: None Small (1-33%) Small (1-33%) Periwound Skin Texture: No Abnormalities Noted Excoriation: Yes Excoriation: Yes Induration: No Induration: No Callus:  No Callus: No Crepitus: No Crepitus: No Rash: No Rash: No Scarring: No Scarring: No Periwound Skin Maceration: Yes Maceration: No Maceration: Yes Moisture: Dry/Scaly: No Dry/Scaly: No Periwound Skin Color: No Abnormalities Noted Atrophie Blanche: No Atrophie Blanche: No Cyanosis: No Cyanosis: No Ecchymosis: No Ecchymosis: No Erythema: No Erythema: No Hemosiderin Staining: No Hemosiderin Staining: No Mottled: No Mottled: No Hoare, Kendric E. ([id number]) Pallor: No Pallor: No Rubor: No Rubor: No Temperature: No Abnormality No Abnormality No Abnormality Tenderness on No No No Palpation: Wound Preparation: Ulcer Cleansing: Ulcer Cleansing: Ulcer Cleansing: Rinsed/Irrigated with Rinsed/Irrigated with Rinsed/Irrigated with Saline Saline Saline Topical Anesthetic Topical Anesthetic Topical Anesthetic Applied: None Applied: None Applied: None Wound Number: 7 N/A N/A Photos: No Photos N/A N/A Wound Location: Left Toe Third N/A N/A Wounding Event: Gradually Appeared N/A N/A Primary Etiology: Diabetic Wound/Ulcer of N/A N/A the Lower Extremity Secondary Etiology: Pressure Ulcer N/A N/A Comorbid History: Anemia, Lymphedema, N/A N/A Congestive Heart Failure, Hypertension, Peripheral Venous Disease, Type II Diabetes, Neuropathy Date Acquired: 10/14/2016 N/A N/A Weeks of Treatment: 6 N/A N/A Wound Status: Open N/A N/A Pending Amputation on No N/A N/A Presentation: Measurements L x W x D 0.1x0.1x0.1 N/A N/A (cm) Area (cm) : 0.008 N/A N/A Volume (cm) : 0.001 N/A N/A % Reduction in Area: 99.50% N/A N/A % Reduction in Volume: 99.30% N/A N/A Classification: Grade 1 N/A N/A Exudate Amount: Large N/A N/A Exudate Type: Serous N/A N/A Exudate Color: amber N/A N/A Foul Odor After Yes N/A N/A Cleansing: Odor Anticipated Due to No N/A N/A Product Use: Wound Margin: Flat and Intact N/A N/A Granulation Amount: Large (67-100%) N/A N/A Granulation Quality: Red N/A  N/A Necrotic Amount: None Present (0%) N/A N/A Exposed Structures: Fat Layer (Subcutaneous N/A N/A Tissue) Exposed: Yes Morre, Morell E. ([id number]) Fascia: No Tendon: No Muscle: No Joint: No Bone: No Epithelialization: None N/A N/A Periwound Skin Texture: Excoriation: Yes N/A N/A Induration: No Callus: No Crepitus: No Rash: No Scarring: No Periwound Skin Maceration: No N/A N/A Moisture: Dry/Scaly: No Periwound Skin Color: Atrophie Blanche: No N/A N/A Cyanosis: No Ecchymosis: No Erythema: No Hemosiderin Staining: No Mottled: No Pallor: No Rubor: No Temperature: No Abnormality N/A N/A Tenderness on No N/A N/A Palpation: Wound Preparation: Ulcer Cleansing: N/A N/A Rinsed/Irrigated with Saline Topical Anesthetic Applied: None Treatment Notes Electronic Signature(s) Signed: 11/26/2016 3:16:27 PM By: Christin Fudge MD, FACS Entered By: Christin Fudge on 11/26/2016 13:59:15 Liddy, [name] ([id number]) -------------------------------------------------------------------------------- [place] Details Patient Name: [name], [name] 11/26/2016 1:30 Date of Service: PM Medical Record [id number] Number: Patient Account Number: [id number] Date of Birth/Sex: 17-Jun-1950 (66 y.o. Male) Treating RN: Elyn Peers, Other Clinician: Primary Care Krislyn Donnan: Olivia Mackie Treating Britto, Cherie Ouch, Thierry Dobosz/Extender: Referring Jimesha Rising: Harless Nakayama in Treatment: 9 Active Inactive ` Abuse / Safety / Falls / Self Care Management Nursing Diagnoses: Potential for falls Goals: Patient will remain injury free related to falls Date Initiated: 09/21/2016 Target Resolution Date: 11/30/2016 Goal Status: Active Interventions: Assess fall risk on admission and as needed Notes: ` Nutrition Nursing Diagnoses: Potential for alteratiion in Nutrition/Potential for  imbalanced nutrition Goals: Patient/caregiver agrees to and verbalizes understanding  of need to use nutritional supplements and/or vitamins as prescribed Date Initiated: 09/21/2016 Target Resolution Date: 11/30/2016 Goal Status: Active Interventions: Assess patient nutrition upon admission and as needed per policy Notes: ` Orientation to the [facility]. ([id number]) Nursing Diagnoses: Knowledge deficit related to the wound healing center program Goals: Patient/caregiver will verbalize understanding of the [place] Date Initiated: 09/21/2016 Target Resolution Date: 11/30/2016 Goal Status: Active Interventions: Provide education on orientation to the wound center Notes: ` Wound/Skin Impairment Nursing Diagnoses: Knowledge deficit related to smoking impact on wound healing Goals: Ulcer/skin breakdown will have a volume reduction of 30% by week 4 Date Initiated: 09/21/2016 Target Resolution Date: 11/30/2016 Goal Status: Active Ulcer/skin breakdown will have a volume reduction of 50% by week 8 Date Initiated: 09/21/2016 Target Resolution Date: 11/30/2016 Goal Status: Active Ulcer/skin breakdown will have a volume reduction of 80% by week 12 Date Initiated: 09/21/2016 Target Resolution Date: 11/30/2016 Goal Status: Active Ulcer/skin breakdown will heal within 14 weeks Date Initiated: 09/21/2016 Target Resolution Date: 11/30/2016 Goal Status: Active Interventions: Assess patient/caregiver ability to obtain necessary supplies Assess patient/caregiver ability to perform ulcer/skin care regimen upon admission and as needed Assess ulceration(s) every visit Notes: Electronic Signature(s) Signed: 11/26/2016 4:32:30 PM By: Montey Hora Entered By: Montey Hora on 11/26/2016 13:51:38 Asmar, [name] ([id number]) -------------------------------------------------------------------------------- Pain Assessment Details Patient Name: [name] 11/26/2016 1:30 Date of Service: PM Medical Record [id number] Number: Patient  Account Number: [id number] Date of Birth/Sex: [birth year] (66 y.o. Male) Treating RN: Elyn Peers, Other Clinician: Primary Care [name] Ceja: Olivia Mackie Treating Britto, Cherie Ouch, Bedie Dominey/Extender: Referring Nikalas [name]: Harless Nakayama in Treatment: 9 Active Problems Location of Pain Severity and Description of Pain Patient Has Paino No Site Locations Pain Management and Medication Current Pain Management: Notes Topical or injectable lidocaine is offered to patient for acute pain when surgical debridement is performed. If needed, Patient is instructed to use over the counter pain medication for the following 24-48 hours after debridement. Wound care MDs do not prescribed pain medications. Patient has chronic pain or uncontrolled pain. Patient has been instructed to make an appointment with their Primary Care Physician for pain management. Electronic Signature(s) Signed: 11/26/2016 4:32:30 PM By: Montey Hora Entered By: Montey Hora on 11/26/2016 13:34:33 Benko, [name] ([id number]) -------------------------------------------------------------------------------- Patient/Caregiver Education Details Patient Name: DONAVON, KIMREY 11/26/2016 1:30 Date of Service: PM Medical Record [id number] Number: Patient Account Number: [id number] Date of Birth/Gender: Jan 03, 1951 (66 y.o. Male) Treating RN: Elyn Peers, Other Clinician: Primary Care Physician: Olivia Mackie Treating Britto, Cherie Ouch, Physician/Extender: Referring Physician: Harless Nakayama in Treatment: 9 Education Assessment Education Provided To: Patient and Caregiver Education Topics Provided Wound/Skin Impairment: Handouts: Other: wound care as ordered Methods: Demonstration, Explain/Verbal Responses: State content correctly Electronic Signature(s) Signed: 11/26/2016 4:32:30 PM By: Montey Hora Entered By: Montey Hora on 11/26/2016 13:52:42 Binford, [name]  ([id number]) -------------------------------------------------------------------------------- Wound Assessment Details Patient Name: [name] Brittle E. 11/26/2016 1:30 Date of Service: PM Medical Record [id number] Number: Patient Account Number: [id number] Date of Birth/Sex: 1951-03-29 (66 y.o. Male) Treating RN: Elyn Peers, Other Clinician: Primary Care Anani Gu: Olivia Mackie Treating Britto, Cherie Ouch, Paitlyn Mcclatchey/Extender: Referring Karsten Vaughn: Harless Nakayama in Treatment: 9 Wound Status Wound Number: 1 Primary Diabetic Wound/Ulcer of the Lower Etiology: Extremity Wound Location: Right Toe Great Wound Open Wounding Event: Gradually Appeared Status: Date Acquired: 06/11/2016 Comorbid Anemia, Lymphedema, Congestive Weeks Of Treatment: 9 History: Heart Failure, Hypertension, Peripheral Clustered Wound: No  Venous Disease, Type II Diabetes, Pending Amputation On Presentation Neuropathy Photos Photo Uploaded By: Montey Hora on 11/26/2016 15:07:40 Wound Measurements Length: (cm) 2.5 Width: (cm) 4.5 Depth: (cm) 0.2 Area: (cm) 8.836 Volume: (cm) 1.767 % Reduction in Area: 81.2% % Reduction in Volume: 62.5% Epithelialization: Large (67-100%) Tunneling: No Undermining: No Wound Description Classification: Grade 1 Foul Odor After Wound Margin: Flat and Intact Due to Product Exudate Amount: Large Slough/Fibrino Exudate Type: Serosanguineous Exudate Color: red, brown Cleansing: Yes Use: No Yes Wound Bed Siddoway, Ajax E. ([id number]) Granulation Amount: Large (67-100%) Exposed Structure Granulation Quality: Pink Fascia Exposed: No Necrotic Amount: None Present (0%) Fat Layer (Subcutaneous Tissue) Exposed: Yes Tendon Exposed: No Muscle Exposed: No Joint Exposed: No Bone Exposed: No Periwound Skin Texture Texture Color No Abnormalities Noted: No No Abnormalities Noted: No Callus: No Atrophie Blanche: No Crepitus: No Cyanosis: No Excoriation:  Yes Ecchymosis: No Induration: No Erythema: No Rash: No Hemosiderin Staining: No Scarring: No Mottled: No Pallor: No Moisture Rubor: No No Abnormalities Noted: No Dry / Scaly: No Temperature / Pain Maceration: Yes Temperature: No Abnormality Wound Preparation Ulcer Cleansing: Rinsed/Irrigated with Saline Topical Anesthetic Applied: None Treatment Notes Wound #1 (Right Toe Great) 1. Cleansed with: Clean wound with Normal Saline 3. Peri-wound Care: Antifungal cream 4. Dressing Applied: Aquacel Ag 5. Secondary Dressing Applied Dry Gauze Kerlix/Conform 7. Secured with Tape Notes netting Electronic Signature(s) Signed: 11/26/2016 4:32:30 PM By: Montey Hora Entered By: Montey Hora on 11/26/2016 13:45:06 Gunther, BRYKER FLETCHALL ([id number]) Dettman, QUINTO TIPPY ([id number]) -------------------------------------------------------------------------------- Wound Assessment Details Patient Name: [name], [name] 11/26/2016 1:30 Date of Service: PM Medical Record [id number] Number: Patient Account Number: [id number] Date of Birth/Sex: 1950/09/01 (66 y.o. Male) Treating RN: Elyn Peers, Other Clinician: Primary Care Myisha Pickerel: Olivia Mackie Treating Britto, Cherie Ouch, Indyah Saulnier/Extender: Referring Tyce Delcid: Harless Nakayama in Treatment: 9 Wound Status Wound Number: 2 Primary Diabetic Wound/Ulcer of the Lower Etiology: Extremity Wound Location: Right Toe Second Wound Open Wounding Event: Gradually Appeared Status: Date Acquired: 06/11/2016 Comorbid Anemia, Lymphedema, Congestive Weeks Of Treatment: 9 History: Heart Failure, Hypertension, Peripheral Clustered Wound: No Venous Disease, Type II Diabetes, Pending Amputation On Presentation Neuropathy Photos Photo Uploaded By: Montey Hora on 11/26/2016 15:07:42 Wound Measurements Length: (cm) 0.6 Width: (cm) 0.5 Depth: (cm) 0.1 Area: (cm) 0.236 Volume: (cm) 0.024 % Reduction in Area: 92.3% %  Reduction in Volume: 92.2% Epithelialization: Small (1-33%) Tunneling: No Undermining: No Wound Description Classification: Grade 1 Foul Odor After Wound Margin: Flat and Intact Due to Product Exudate Amount: Large Slough/Fibrino Exudate Type: Serosanguineous Exudate Color: red, brown Cleansing: Yes Use: No Yes Wound Bed Faubert, Jevin E. ([id number]) Granulation Amount: Large (67-100%) Exposed Structure Granulation Quality: Pink Fascia Exposed: No Necrotic Amount: None Present (0%) Fat Layer (Subcutaneous Tissue) Exposed: Yes Tendon Exposed: No Muscle Exposed: No Joint Exposed: No Bone Exposed: No Periwound Skin Texture Texture Color No Abnormalities Noted: No No Abnormalities Noted: No Callus: No Atrophie Blanche: No Crepitus: No Cyanosis: No Excoriation: Yes Ecchymosis: No Induration: No Erythema: No Rash: No Hemosiderin Staining: No Scarring: No Mottled: No Pallor: No Moisture Rubor: No No Abnormalities Noted: No Dry / Scaly: No Temperature / Pain Maceration: Yes Temperature: No Abnormality Wound Preparation Ulcer Cleansing: Rinsed/Irrigated with Saline Topical Anesthetic Applied: None Treatment Notes Wound #2 (Right Toe Second) 1. Cleansed with: Clean wound with Normal Saline 3. Peri-wound Care: Antifungal cream 4. Dressing Applied: Aquacel Ag 5. Secondary Dressing Applied Dry Gauze Kerlix/Conform 7. Secured with Tape Notes netting Electronic Signature(s) Signed: 11/26/2016 4:32:30 PM By:  Dorthy, Joanna Entered By: Montey Hora on 11/26/2016 13:45:29 Radell, [name] ([id number]) Maryjo Rochester, BELDON NOWLING ([id number]) -------------------------------------------------------------------------------- Wound Assessment Details Patient Name: JALEEN, FINCH 11/26/2016 1:30 Date of Service: PM Medical Record [id number] Number: Patient Account Number: [id number] Date of Birth/Sex: January 09, 1951 (66 y.o. Male) Treating RN: Elyn Peers, Other  Clinician: Primary Care Monseratt Ledin: Olivia Mackie Treating Britto, Cherie Ouch, Aaryn Parrilla/Extender: Referring Kaysey Berndt: Harless Nakayama in Treatment: 9 Wound Status Wound Number: 3 Primary Diabetic Wound/Ulcer of the Lower Etiology: Extremity Wound Location: Right Toe Third Wound Open Wounding Event: Gradually Appeared Status: Date Acquired: 06/11/2016 Comorbid Anemia, Lymphedema, Congestive Weeks Of Treatment: 9 History: Heart Failure, Hypertension, Peripheral Clustered Wound: No Venous Disease, Type II Diabetes, Pending Amputation On Presentation Neuropathy Photos Photo Uploaded By: Montey Hora on 11/26/2016 15:08:20 Wound Measurements Length: (cm) 0.1 Width: (cm) 0.1 Depth: (cm) 0.1 Area: (cm) 0.008 Volume: (cm) 0.001 % Reduction in Area: 98.4% % Reduction in Volume: 98% Epithelialization: Small (1-33%) Tunneling: No Undermining: No Wound Description Classification: Grade 1 Foul Odor After Wound Margin: Flat and Intact Due to Product Exudate Amount: Large Slough/Fibrino Exudate Type: Serous Exudate Color: amber Cleansing: Yes Use: No Yes Wound Bed Tschirhart, Ladon E. ([id number]) Granulation Amount: Large (67-100%) Exposed Structure Granulation Quality: Pink Fascia Exposed: No Necrotic Amount: None Present (0%) Fat Layer (Subcutaneous Tissue) Exposed: Yes Tendon Exposed: No Muscle Exposed: No Joint Exposed: No Bone Exposed: No Periwound Skin Texture Texture Color No Abnormalities Noted: No No Abnormalities Noted: No Callus: No Atrophie Blanche: No Crepitus: No Cyanosis: No Excoriation: Yes Ecchymosis: No Induration: No Erythema: No Rash: No Hemosiderin Staining: No Scarring: No Mottled: No Pallor: No Moisture Rubor: No No Abnormalities Noted: No Dry / Scaly: No Temperature / Pain Maceration: No Temperature: No Abnormality Wound Preparation Ulcer Cleansing: Rinsed/Irrigated with Saline Topical Anesthetic Applied: None Treatment  Notes Wound #3 (Right Toe Third) 1. Cleansed with: Clean wound with Normal Saline 3. Peri-wound Care: Antifungal cream 4. Dressing Applied: Aquacel Ag 5. Secondary Dressing Applied Dry Gauze Kerlix/Conform 7. Secured with Tape Notes netting Electronic Signature(s) Signed: 11/26/2016 4:32:30 PM By: Montey Hora Entered By: Montey Hora on 11/26/2016 13:45:50 Righter, JACQUEL MCCAMISH ([id number]) Sonnen, OLSEN MCCUTCHAN ([id number]) -------------------------------------------------------------------------------- Wound Assessment Details Patient Name: [name], [name] 11/26/2016 1:30 Date of Service: PM Medical Record [id number] Number: Patient Account Number: [id number] Date of Birth/Sex: [birth year] (66 y.o. Male) Treating RN: Elyn Peers, Other Clinician: Primary Care Jumanah Hynson: Olivia Mackie Treating Britto, Cherie Ouch, Saabir Blyth/Extender: Referring Saranne Crislip: Harless Nakayama in Treatment: 9 Wound Status Wound Number: 4 Primary Diabetic Wound/Ulcer of the Lower Etiology: Extremity Wound Location: Right Foot - Dorsal Wound Open Wounding Event: Gradually Appeared Status: Date Acquired: 06/11/2016 Comorbid Anemia, Lymphedema, Congestive Weeks Of Treatment: 9 History: Heart Failure, Hypertension, Peripheral Clustered Wound: No Venous Disease, Type II Diabetes, Pending Amputation On Presentation Neuropathy Photos Photo Uploaded By: Montey Hora on 11/26/2016 15:08:21 Wound Measurements Length: (cm) 0.6 Width: (cm) 0.7 Depth: (cm) 0.1 Area: (cm) 0.33 Volume: (cm) 0.033 % Reduction in Area: 92.6% % Reduction in Volume: 92.6% Epithelialization: None Tunneling: No Undermining: No Wound Description Classification: Grade 1 Foul Odor After Wound Margin: Distinct, outline attached Due to Product Exudate Amount: Large Exudate Type: Serous Exudate Color: amber Cleansing: Yes Use: No Wound Bed Farler, Darvis E. ([id number]) Granulation Amount: Large  (67-100%) Granulation Quality: Red Necrotic Amount: None Present (0%) Periwound Skin Texture Texture Color No Abnormalities Noted: No No Abnormalities Noted: No Moisture Temperature / Pain No Abnormalities Noted: No Temperature: No Abnormality Maceration: Yes Wound Preparation  Ulcer Cleansing: Rinsed/Irrigated with Saline Topical Anesthetic Applied: None Treatment Notes Wound #4 (Right, Dorsal Foot) 1. Cleansed with: Clean wound with Normal Saline 3. Peri-wound Care: Antifungal cream 4. Dressing Applied: Aquacel Ag 5. Secondary Dressing Applied Dry Gauze Kerlix/Conform 7. Secured with Tape Notes netting Electronic Signature(s) Signed: 11/26/2016 4:32:30 PM By: Montey Hora Entered By: Montey Hora on 11/26/2016 13:46:08 Tierno, [name] ([id number]) -------------------------------------------------------------------------------- Wound Assessment Details Patient Name: [name], [name] 11/26/2016 1:30 Date of Service: PM Medical Record [id number] Number: Patient Account Number: [id number] Date of Birth/Sex: [birth year] (66 y.o. Male) Treating RN: Elyn Peers, Other Clinician: Primary Care Allea Kassner: Olivia Mackie Treating Britto, Cherie Ouch, Sharetha Newson/Extender: Referring Ravin Denardo: Harless Nakayama in Treatment: 9 Wound Status Wound Number: 5 Primary Diabetic Wound/Ulcer of the Lower Etiology: Extremity Wound Location: Left Toe Great Wound Open Wounding Event: Gradually Appeared Status: Date Acquired: 06/11/2016 Comorbid Anemia, Lymphedema, Congestive Weeks Of Treatment: 9 History: Heart Failure, Hypertension, Peripheral Clustered Wound: No Venous Disease, Type II Diabetes, Pending Amputation On Presentation Neuropathy Photos Photo Uploaded By: Montey Hora on 11/26/2016 15:09:02 Wound Measurements Length: (cm) 1.3 Width: (cm) 1.4 Depth: (cm) 0.1 Area: (cm) 1.429 Volume: (cm) 0.143 % Reduction in Area: 90% % Reduction in Volume:  90% Epithelialization: Small (1-33%) Tunneling: No Undermining: No Wound Description Classification: Grade 1 Foul Odor After Wound Margin: Flat and Intact Due to Product Exudate Amount: Large Slough/Fibrino Exudate Type: Serous Exudate Color: amber Cleansing: Yes Use: No Yes Wound Bed Janicki, Jaidyn E. ([id number]) Granulation Amount: Large (67-100%) Exposed Structure Granulation Quality: Pink Fascia Exposed: No Necrotic Amount: None Present (0%) Fat Layer (Subcutaneous Tissue) Exposed: Yes Tendon Exposed: No Muscle Exposed: No Joint Exposed: No Bone Exposed: No Periwound Skin Texture Texture Color No Abnormalities Noted: No No Abnormalities Noted: No Callus: No Atrophie Blanche: No Crepitus: No Cyanosis: No Excoriation: Yes Ecchymosis: No Induration: No Erythema: No Rash: No Hemosiderin Staining: No Scarring: No Mottled: No Pallor: No Moisture Rubor: No No Abnormalities Noted: No Dry / Scaly: No Temperature / Pain Maceration: No Temperature: No Abnormality Wound Preparation Ulcer Cleansing: Rinsed/Irrigated with Saline Topical Anesthetic Applied: None Treatment Notes Wound #5 (Left Toe Great) 1. Cleansed with: Clean wound with Normal Saline 3. Peri-wound Care: Antifungal cream 4. Dressing Applied: Aquacel Ag 5. Secondary Dressing Applied Dry Gauze Kerlix/Conform 7. Secured with Tape Notes netting Electronic Signature(s) Signed: 11/26/2016 4:32:30 PM By: Montey Hora Entered By: Montey Hora on 11/26/2016 13:46:25 Linville, DHANVIN SZETO ([id number]) Paszkiewicz, JESTEN CAPPUCCIO ([id number]) -------------------------------------------------------------------------------- Wound Assessment Details Patient Name: [name], [name] 11/26/2016 1:30 Date of Service: PM Medical Record [id number] Number: Patient Account Number: [id number] Date of Birth/Sex: Dec 26, 1950 (66 y.o. Male) Treating RN: Elyn Peers, Other Clinician: Primary Care Ciarra Braddy: Olivia Mackie  Treating Britto, Cherie Ouch, Amaranta Mehl/Extender: Referring Ginevra Tacker: Harless Nakayama in Treatment: 9 Wound Status Wound Number: 6 Primary Diabetic Wound/Ulcer of the Lower Etiology: Extremity Wound Location: Left Toe Second Wound Open Wounding Event: Gradually Appeared Status: Date Acquired: 06/11/2016 Comorbid Anemia, Lymphedema, Congestive Weeks Of Treatment: 9 History: Heart Failure, Hypertension, Peripheral Clustered Wound: No Venous Disease, Type II Diabetes, Pending Amputation On Presentation Neuropathy Photos Photo Uploaded By: Montey Hora on 11/26/2016 15:09:03 Wound Measurements Length: (cm) 0.6 Width: (cm) 0.7 Depth: (cm) 0.1 Area: (cm) 0.33 Volume: (cm) 0.033 % Reduction in Area: 84.6% % Reduction in Volume: 84.6% Epithelialization: Small (1-33%) Tunneling: No Undermining: No Wound Description Classification: Grade 1 Foul Odor After Wound Margin: Flat and Intact Due to Product Exudate Amount: Large Slough/Fibrino Exudate Type: Serous Exudate Color: amber  Cleansing: Yes Use: No Yes Wound Bed Wilmeth, Julian E. ([id number]) Granulation Amount: Large (67-100%) Exposed Structure Granulation Quality: Pink Fascia Exposed: No Necrotic Amount: None Present (0%) Fat Layer (Subcutaneous Tissue) Exposed: Yes Tendon Exposed: No Muscle Exposed: No Joint Exposed: No Bone Exposed: No Periwound Skin Texture Texture Color No Abnormalities Noted: No No Abnormalities Noted: No Callus: No Atrophie Blanche: No Crepitus: No Cyanosis: No Excoriation: Yes Ecchymosis: No Induration: No Erythema: No Rash: No Hemosiderin Staining: No Scarring: No Mottled: No Pallor: No Moisture Rubor: No No Abnormalities Noted: No Dry / Scaly: No Temperature / Pain Maceration: Yes Temperature: No Abnormality Wound Preparation Ulcer Cleansing: Rinsed/Irrigated with Saline Topical Anesthetic Applied: None Treatment Notes Wound #6 (Left Toe Second) 1. Cleansed  with: Clean wound with Normal Saline 3. Peri-wound Care: Antifungal cream 4. Dressing Applied: Aquacel Ag 5. Secondary Dressing Applied Dry Gauze Kerlix/Conform 7. Secured with Tape Notes netting Electronic Signature(s) Signed: 11/26/2016 4:32:30 PM By: Montey Hora Entered By: Montey Hora on 11/26/2016 13:46:44 Nigh, JOANATHAN AFFELDT ([id number]) Eckerman, MONTEL VANDERHOOF ([id number]) -------------------------------------------------------------------------------- Wound Assessment Details Patient Name: [name], [name] 11/26/2016 1:30 Date of Service: PM Medical Record [id number] Number: Patient Account Number: [id number] Date of Birth/Sex: 10-14-1950 (66 y.o. Male) Treating RN: Elyn Peers, Other Clinician: Primary Care Saanvi Hakala: Olivia Mackie Treating Britto, Cherie Ouch, Aleda Madl/Extender: Referring Liberta Gimpel: Harless Nakayama in Treatment: 9 Wound Status Wound Number: 7 Primary Diabetic Wound/Ulcer of the Lower Etiology: Extremity Wound Location: Left Toe Third Secondary Pressure Ulcer Wounding Event: Gradually Appeared Etiology: Date Acquired: 10/14/2016 Wound Open Weeks Of Treatment: 6 Status: Clustered Wound: No Comorbid Anemia, Lymphedema, Congestive History: Heart Failure, Hypertension, Peripheral Venous Disease, Type II Diabetes, Neuropathy Photos Photo Uploaded By: Montey Hora on 11/26/2016 15:10:11 Wound Measurements Length: (cm) 0.1 Width: (cm) 0.1 Depth: (cm) 0.1 Area: (cm) 0.008 Volume: (cm) 0.001 % Reduction in Area: 99.5% % Reduction in Volume: 99.3% Epithelialization: None Tunneling: No Undermining: No Wound Description Classification: Grade 1 Foul Odor After Wound Margin: Flat and Intact Due to Product Exudate Amount: Large Slough/Fibrino Exudate Type: Serous Exudate Color: amber Fusselman, Knoxx E. ([id number]) Cleansing: Yes Use: No Yes Wound Bed Granulation Amount: Large (67-100%) Exposed Structure Granulation Quality:  Red Fascia Exposed: No Necrotic Amount: None Present (0%) Fat Layer (Subcutaneous Tissue) Exposed: Yes Tendon Exposed: No Muscle Exposed: No Joint Exposed: No Bone Exposed: No Periwound Skin Texture Texture Color No Abnormalities Noted: No No Abnormalities Noted: No Callus: No Atrophie Blanche: No Crepitus: No Cyanosis: No Excoriation: Yes Ecchymosis: No Induration: No Erythema: No Rash: No Hemosiderin Staining: No Scarring: No Mottled: No Pallor: No Moisture Rubor: No No Abnormalities Noted: No Dry / Scaly: No Temperature / Pain Maceration: No Temperature: No Abnormality Wound Preparation Ulcer Cleansing: Rinsed/Irrigated with Saline Topical Anesthetic Applied: None Treatment Notes Wound #7 (Left Toe Third) 1. Cleansed with: Clean wound with Normal Saline 3. Peri-wound Care: Antifungal cream 4. Dressing Applied: Aquacel Ag 5. Secondary Dressing Applied Dry Gauze Kerlix/Conform 7. Secured with Tape Notes netting Electronic Signature(s) Signed: 11/26/2016 4:32:30 PM By: Enid Derry ([id number]) Entered By: Montey Hora on 11/26/2016 13:47:04 Simmer, LAMOUNT BANKSON ([id number]) -------------------------------------------------------------------------------- Vitals Details Patient Name: CERVANDO, DURNIN 11/26/2016 1:30 Date of Service: PM Medical Record [id number] Number: Patient Account Number: [id number] Date of Birth/Sex: 02-16-1951 (66 y.o. Male) Treating RN: Elyn Peers, Other Clinician: Primary Care Nani Ingram: Olivia Mackie Treating Britto, Cherie Ouch, Lita Flynn/Extender: Referring Raeanna Soberanes: Harless Nakayama in Treatment: 9 Vital Signs Time Taken: 13:35 Temperature (F): 98.4 Height (in): 69 Pulse (bpm):  61 Weight (lbs): 168 Respiratory Rate (breaths/min): 16 Body Mass Index (BMI): 24.8 Blood Pressure (mmHg): 154/70 Reference Range: 80 - 120 mg / dl Electronic Signature(s) Signed: 11/26/2016 4:32:30 PM By:  Montey Hora Entered By: Montey Hora on 11/26/2016 13:37:41

## 2016-12-03 ENCOUNTER — Encounter: Payer: Medicare HMO | Admitting: Surgery

## 2016-12-03 DIAGNOSIS — E11621 Type 2 diabetes mellitus with foot ulcer: Secondary | ICD-10-CM | POA: Diagnosis not present

## 2016-12-04 NOTE — Progress Notes (Signed)
IZAK, ANDING ([id number]) Visit Report for 12/03/2016 Arrival Information Details Patient Name: [name], [name] 12/03/2016 1:30 Date of Service: PM Medical Record [id number] Number: Patient Account Number: [id number] Date of Birth/Sex: April 12, 1950 (66 y.o. Male) Treating RN: [name], Other Clinician: Primary Care [name]: [name] Treating [name], [name], [name]/Extender: Referring [name]: [name] in Treatment: 10 Visit Information History Since Last Visit Added or deleted any medications: No Patient Arrived: Ambulatory Any new allergies or adverse reactions: No Arrival Time: 13:35 Had a fall or experienced change in No Accompanied By: brother activities of daily living that may affect Transfer Assistance: None risk of falls: Patient Identification Verified: Yes Signs or symptoms of abuse/neglect since last No Secondary Verification Process Yes visito Completed: Hospitalized since last visit: No Patient Requires Transmission-Based No Has Dressing in Place as Prescribed: Yes Precautions: Pain Present Now: No Patient Has Alerts: Yes Patient Alerts: DMII Electronic Signature(s) Signed: 12/03/2016 4:25:47 PM By: [name] Entered By: [name] on 12/03/2016 13:35:46 [name] ([id number]) -------------------------------------------------------------------------------- Clinic Level of Care Assessment Details Patient Name: [name], [name] 12/03/2016 1:30 Date of Service: PM Medical Record [id number] Number: Patient Account Number: [id number] Date of Birth/Sex: Mar 09, 1951 (66 y.o. Male) Treating RN: [name], Other Clinician: Primary Care Avrey Flanagin: [name] Treating [name], [name], [name]/Extender: Referring Amrom Ore: [name] in Treatment: 10 Clinic Level of Care Assessment Items TOOL 4 Quantity Score []  - Use when only an EandM is performed on FOLLOW-UP visit  0 ASSESSMENTS - Nursing Assessment / Reassessment X - Reassessment of Co-morbidities (includes updates in patient status) 1 10 X - Reassessment of Adherence to Treatment Plan 1 5 ASSESSMENTS - Wound and Skin Assessment / Reassessment []  - Simple Wound Assessment / Reassessment - one wound 0 X - Complex Wound Assessment / Reassessment - multiple wounds 7 5 []  - Dermatologic / Skin Assessment (not related to wound area) 0 ASSESSMENTS - Focused Assessment []  - Circumferential Edema Measurements - multi extremities 0 []  - Nutritional Assessment / Counseling / Intervention 0 X - Lower Extremity Assessment (monofilament, tuning fork, pulses) 1 5 []  - Peripheral Arterial Disease Assessment (using hand held doppler) 0 ASSESSMENTS - Ostomy and/or Continence Assessment and Care []  - Incontinence Assessment and Management 0 []  - Ostomy Care Assessment and Management (repouching, etc.) 0 PROCESS - Coordination of Care X - Simple Patient / Family Education for ongoing care 1 15 []  - Complex (extensive) Patient / Family Education for ongoing care 0 []  - Staff obtains Consents, Records, Test Results / Process Orders 0 [name], [name] BARTOLI ([id number]) []  - Staff telephones HHA, Nursing Homes / Clarify orders / etc 0 []  - Routine Transfer to another Facility (non-emergent condition) 0 []  - Routine Hospital Admission (non-emergent condition) 0 []  - New Admissions / Biomedical engineer / Ordering NPWT, Apligraf, etc. 0 []  - Emergency Hospital Admission (emergent condition) 0 X - Simple Discharge Coordination 1 10 []  - Complex (extensive) Discharge Coordination 0 PROCESS - Special Needs []  - Pediatric / Minor Patient Management 0 []  - Isolation Patient Management 0 []  - Hearing / Language / Visual special needs 0 []  - Assessment of Community assistance (transportation, D/C planning, etc.) 0 []  - Additional assistance / Altered mentation 0 []  - Support Surface(s) Assessment (bed, cushion, seat, etc.)  0 INTERVENTIONS - Wound Cleansing / Measurement []  - Simple Wound Cleansing - one wound 0 X - Complex Wound Cleansing - multiple wounds 7 5 X - Wound Imaging (photographs - any number of wounds) 1  5 []  - Wound Tracing (instead of photographs) 0 []  - Simple Wound Measurement - one wound 0 X - Complex Wound Measurement - multiple wounds 7 5 INTERVENTIONS - Wound Dressings X - Small Wound Dressing one or multiple wounds 6 10 []  - Medium Wound Dressing one or multiple wounds 0 []  - Large Wound Dressing one or multiple wounds 0 []  - Application of Medications - topical 0 []  - Application of Medications - injection 0 [name], [name] ([id number]) INTERVENTIONS - Miscellaneous []  - External ear exam 0 []  - Specimen Collection (cultures, biopsies, blood, body fluids, etc.) 0 []  - Specimen(s) / Culture(s) sent or taken to Lab for analysis 0 []  - Patient Transfer (multiple staff / Harrel Lemon Lift / Similar devices) 0 []  - Simple Staple / Suture removal (25 or less) 0 []  - Complex Staple / Suture removal (26 or more) 0 []  - Hypo / Hyperglycemic Management (close monitor of Blood Glucose) 0 []  - Ankle / Brachial Index (ABI) - do not check if billed separately 0 X - Vital Signs 1 5 Has the patient been seen at the hospital within the last three years: Yes Total Score: 220 Level Of Care: New/Established - Level 5 Electronic Signature(s) Signed: 12/03/2016 4:25:47 PM By: [name] Entered By: [name] on 12/03/2016 15:10:06 [name] ([id number]) -------------------------------------------------------------------------------- Encounter Discharge Information Details Patient Name: [name], [name] 12/03/2016 1:30 Date of Service: PM Medical Record [id number] Number: Patient Account Number: [id number] Date of Birth/Sex: 1950/06/19 (66 y.o. Male) Treating RN: [name], Other Clinician: Primary Care [name]: [name] Treating [name], [name],  [name]/Extender: Referring [name]: [name] in Treatment: 10 Encounter Discharge Information Items Discharge Pain Level: 0 Discharge Condition: Stable Ambulatory Status: Ambulatory Discharge Destination: Home Transportation: Private Auto Accompanied By: brother Schedule Follow-up Appointment: Yes Medication Reconciliation completed and provided to Patient/Care No Kristl Morioka: Provided on Clinical Summary of Care: 12/03/2016 Form Type Recipient Paper Patient ed Electronic Signature(s) Signed: 12/03/2016 3:11:17 PM By: [name] Previous Signature: 12/03/2016 2:22:29 PM Version By: Lorine Bears RCP, RRT, CHT Entered By: [name] on 12/03/2016 15:11:17 Sherry, [name] ([id number]) -------------------------------------------------------------------------------- Lower Extremity Assessment Details Patient Name: [name], [name] 12/03/2016 1:30 Date of Service: PM Medical Record [id number] Number: Patient Account Number: [id number] Date of Birth/Sex: 1950/09/23 (66 y.o. Male) Treating RN: [name], Other Clinician: Primary Care Westin Knotts: [name] Treating [name], [name], Lige Lakeman/Extender: Referring Blayke Cordrey: [name] in Treatment: 10 Vascular Assessment Pulses: Dorsalis Pedis Palpable: [Left:Yes] [Right:Yes] Posterior Tibial Extremity colors, hair growth, and conditions: Extremity Color: [Left:Hyperpigmented] [Right:Hyperpigmented] Hair Growth on Extremity: [Left:No] [Right:No] Temperature of Extremity: [Left:Warm] [Right:Warm] Capillary Refill: [Left:< 3 seconds] [Right:< 3 seconds] Electronic Signature(s) Signed: 12/03/2016 4:25:47 PM By: [name] Entered By: [name] on 12/03/2016 13:46:02 Podolski, [name] ([id number]) -------------------------------------------------------------------------------- Multi Wound Chart Details Patient Name: [name] Brittle E. 12/03/2016 1:30 Date of  Service: PM Medical Record [id number] Number: Patient Account Number: [id number] Date of Birth/Sex: 1950-10-13 (66 y.o. Male) Treating RN: [name], Other Clinician: Primary Care Tyshana Nishida: [name] Treating [name], [name], Youcef Klas/Extender: Referring Bethanne Mule: [name] in Treatment: 10 Vital Signs Height(in): 69 Pulse(bpm): 66 Weight(lbs): 168 Blood Pressure 158/75 (mmHg): Body Mass Index(BMI): 25 Temperature(F): 97.8 Respiratory Rate 16 (breaths/min): Photos: [1:No Photos] [2:No Photos] [3:No Photos] Wound Location: [1:Right Toe Great] [2:Right Toe Second] [3:Right Toe Third] Wounding Event: [1:Gradually Appeared] [2:Gradually Appeared] [3:Gradually Appeared] Primary Etiology: [1:Diabetic Wound/Ulcer of the Lower Extremity] [2:Diabetic Wound/Ulcer of the Lower Extremity] [3:Diabetic Wound/Ulcer of the Lower  Extremity] Secondary Etiology: [1:N/A] [2:N/A] [3:N/A] Comorbid History: [1:Anemia, Lymphedema, Congestive Heart Failure, Hypertension, Peripheral Venous Disease, Type II Diabetes, Neuropathy] [2:Anemia, Lymphedema, Congestive Heart Failure, Hypertension, Peripheral Venous Disease, Type II Diabetes,  Neuropathy] [3:N/A] Date Acquired: [1:06/11/2016] [2:06/11/2016] [3:06/11/2016] Weeks of Treatment: [1:10] [2:10] [3:10] Wound Status: [1:Open] [2:Open] [3:Healed - Epithelialized] Pending Amputation on Yes [2:Yes] [3:Yes] Presentation: Measurements L x W x D 3x6x0.2 [2:1.2x1.5x0.1] [3:0x0x0] (cm) Area (cm) : [1:14.137] [2:1.414] [3:0] Volume (cm) : [1:2.827] [2:0.141] [3:0] % Reduction in Area: [1:70.00%] [2:54.00%] [3:100.00%] % Reduction in Volume: 40.00% [2:54.10%] [3:100.00%] Classification: [1:Grade 1] [2:Grade 1] [3:Grade 1] Exudate Amount: [1:Large] [2:Large] [3:N/A] Exudate Type: [1:Serosanguineous] [2:Serosanguineous] [3:N/A] Exudate Color: [1:red, brown Yes] [2:red, brown Yes] [3:N/A N/A] Foul Odor After Cleansing: Odor  Anticipated Due to No No N/A Product Use: Wound Margin: Flat and Intact Flat and Intact N/A Granulation Amount: Large (67-100%) Large (67-100%) N/A Granulation Quality: Pink Pink N/A Necrotic Amount: None Present (0%) None Present (0%) N/A Exposed Structures: Fat Layer (Subcutaneous Fat Layer (Subcutaneous N/A Tissue) Exposed: Yes Tissue) Exposed: Yes Fascia: No Fascia: No Tendon: No Tendon: No Muscle: No Muscle: No Joint: No Joint: No Bone: No Bone: No Epithelialization: Large (67-100%) Small (1-33%) N/A Periwound Skin Texture: Excoriation: Yes Excoriation: Yes No Abnormalities Noted Induration: No Induration: No Callus: No Callus: No Crepitus: No Crepitus: No Rash: No Rash: No Scarring: No Scarring: No Periwound Skin Maceration: Yes Maceration: Yes No Abnormalities Noted Moisture: Dry/Scaly: No Dry/Scaly: No Periwound Skin Color: Atrophie Blanche: No Atrophie Blanche: No No Abnormalities Noted Cyanosis: No Cyanosis: No Ecchymosis: No Ecchymosis: No Erythema: No Erythema: No Hemosiderin Staining: No Hemosiderin Staining: No Mottled: No Mottled: No Pallor: No Pallor: No Rubor: No Rubor: No Temperature: No Abnormality No Abnormality N/A Tenderness on No No No Palpation: Wound Preparation: Ulcer Cleansing: Ulcer Cleansing: N/A Rinsed/Irrigated with Rinsed/Irrigated with Saline Saline Topical Anesthetic Topical Anesthetic Applied: None Applied: None Wound Number: [id number] 5 6  Photos: No Photos No Photos No Photos Wound Location: Right Foot - Dorsal Left Toe Great Left Toe Second Wounding Event: Gradually Appeared Gradually Appeared Gradually Appeared Primary 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 ([id number]) Anemia, Lymphedema, Anemia, Lymphedema, Anemia, Lymphedema, Congestive Heart  Failure, Congestive Heart Failure, Congestive Heart Failure, Hypertension, Peripheral Hypertension, Peripheral Hypertension, Peripheral Venous Disease, Type II Venous Disease, Type II Venous Disease, Type II Diabetes, Neuropathy Diabetes, Neuropathy Diabetes, Neuropathy Date Acquired: 06/11/2016 06/11/2016 06/11/2016 Weeks of Treatment: 10 10 10  Wound Status: Open Open Open Pending Amputation on Yes Yes Yes Presentation: Measurements L x W x D 0.7x0.5x0.1 1x0.9x0.1 0.5x0.5x0.1 (cm) Area (cm) : 0.275 0.707 0.196 Volume (cm) : 0.027 0.071 0.02 % Reduction in Area: 93.80% 95.10% 90.80% % Reduction in Volume: 93.90% 95.00% 90.70% Classification: Grade 1 Grade 1 Grade 1 Exudate Amount: Large Large Large Exudate Type: Serous Serous Serous Exudate Color: amber amber amber Foul Odor After Yes Yes Yes Cleansing: Odor Anticipated Due to No No No Product Use: Wound Margin: Distinct, outline attached Flat and Intact Flat and Intact Granulation Amount: Large (67-100%) Large (67-100%) Large (67-100%) Granulation Quality: Red Pink Pink Necrotic Amount: None Present (0%) None Present (0%) None Present (0%) Exposed Structures: Fascia: No Fat Layer (Subcutaneous Fat Layer (Subcutaneous Fat Layer (Subcutaneous Tissue) Exposed: Yes Tissue) Exposed: Yes Tissue) Exposed: No Fascia: No Fascia: No Tendon: No Tendon: No Tendon: No Muscle: No Muscle: No Muscle:  No Joint: No Joint: No Joint: No Bone: No Bone: No Bone: No Epithelialization: None Small (1-33%) Small (1-33%) Periwound Skin Texture: Excoriation: Yes Excoriation: Yes Excoriation: Yes Induration: No Induration: No Induration: No Callus: No Callus: No Callus: No Crepitus: No Crepitus: No Crepitus: No Rash: No Rash: No Rash: No Scarring: No Scarring: No Scarring: No Periwound Skin Maceration: No Maceration: No Maceration: Yes Moisture: Dry/Scaly: No Dry/Scaly: No Dry/Scaly: No Periwound Skin Color: Atrophie Blanche:  No Atrophie Blanche: No Atrophie Blanche: No Cyanosis: No Cyanosis: No Cyanosis: No Ecchymosis: No Ecchymosis: No Ecchymosis: No Erythema: No Erythema: No Erythema: No Hemosiderin Staining: No Hemosiderin Staining: No Hemosiderin Staining: No Mottled: No Mottled: No Mottled: No Nurse, Deunte E. ([id number]) Pallor: No Pallor: No Pallor: No Rubor: No Rubor: No Rubor: No Temperature: No Abnormality No Abnormality No Abnormality Tenderness on No No No Palpation: Wound Preparation: Ulcer Cleansing: Ulcer Cleansing: Ulcer Cleansing: Rinsed/Irrigated with Rinsed/Irrigated with Rinsed/Irrigated with Saline Saline Saline Topical Anesthetic Topical Anesthetic Topical Anesthetic Applied: None Applied: None Applied: None Wound Number: 7 N/A N/A Photos: No Photos N/A N/A Wound Location: Left Toe Third N/A N/A Wounding Event: Gradually Appeared N/A N/A Primary Etiology: Diabetic Wound/Ulcer of N/A N/A the Lower Extremity Secondary Etiology: Pressure Ulcer N/A N/A Comorbid History: Anemia, Lymphedema, N/A N/A Congestive Heart Failure, Hypertension, Peripheral Venous Disease, Type II Diabetes, Neuropathy Date Acquired: 10/14/2016 N/A N/A Weeks of Treatment: 7 N/A N/A Wound Status: Open N/A N/A Pending Amputation on No N/A N/A Presentation: Measurements L x W x D 0.5x0.4x0.1 N/A N/A (cm) Area (cm) : 0.157 N/A N/A Volume (cm) : 0.016 N/A N/A % Reduction in Area: 89.80% N/A N/A % Reduction in Volume: 89.50% N/A N/A Classification: Grade 1 N/A N/A Exudate Amount: Large N/A N/A Exudate Type: Serous N/A N/A Exudate Color: amber N/A N/A Foul Odor After Yes N/A N/A Cleansing: Odor Anticipated Due to No N/A N/A Product Use: Wound Margin: Flat and Intact N/A N/A Granulation Amount: Large (67-100%) N/A N/A Granulation Quality: Red N/A N/A Necrotic Amount: None Present (0%) N/A N/A Exposed Structures: Fat Layer (Subcutaneous N/A N/A Tissue) Exposed: Yes Neuroth, Wael E.  ([id number]) Fascia: No Tendon: No Muscle: No Joint: No Bone: No Epithelialization: None N/A N/A Periwound Skin Texture: Excoriation: Yes N/A N/A Induration: No Callus: No Crepitus: No Rash: No Scarring: No Periwound Skin Maceration: No N/A N/A Moisture: Dry/Scaly: No Periwound Skin Color: Atrophie Blanche: No N/A N/A Cyanosis: No Ecchymosis: No Erythema: No Hemosiderin Staining: No Mottled: No Pallor: No Rubor: No Temperature: No Abnormality N/A N/A Tenderness on No N/A N/A Palpation: Wound Preparation: Ulcer Cleansing: N/A N/A Rinsed/Irrigated with Saline Topical Anesthetic Applied: None Treatment Notes Electronic Signature(s) Signed: 12/03/2016 2:50:55 PM By: Christin Fudge MD, FACS Entered By: Christin Fudge on 12/03/2016 14:16:19 Dorval, [name] ([id number]) -------------------------------------------------------------------------------- Multi-Disciplinary Care Plan Details Patient Name: [name], [name] 12/03/2016 1:30 Date of Service: PM Medical Record [id number] Number: Patient Account Number: [id number] Date of Birth/Sex: March 13, 1951 (66 y.o. Male) Treating RN: [name], Other Clinician: Primary Care Fatimata Talsma: [name] Treating [name], [name], Jessly Lebeck/Extender: Referring Konnor Vondrasek: [name] in Treatment: 10 Active Inactive ` Abuse / Safety / Falls / Self Care Management Nursing Diagnoses: Potential for falls Goals: Patient will remain injury free related to falls Date Initiated: 09/21/2016 Target Resolution Date: 11/30/2016 Goal Status: Active Interventions: Assess fall risk on admission and as needed Notes: ` Nutrition Nursing Diagnoses: Potential for alteratiion in Nutrition/Potential for imbalanced nutrition Goals: Patient/caregiver agrees to and verbalizes understanding of need  to use nutritional supplements and/or vitamins as prescribed Date Initiated: 09/21/2016 Target Resolution Date: 11/30/2016 Goal  Status: Active Interventions: Assess patient nutrition upon admission and as needed per policy Notes: ` Orientation to the [facility] [place]. ([id number]) Nursing Diagnoses: Knowledge deficit related to the wound healing center program Goals: Patient/caregiver will verbalize understanding of the [place] Date Initiated: 09/21/2016 Target Resolution Date: 11/30/2016 Goal Status: Active Interventions: Provide education on orientation to the wound center Notes: ` Wound/Skin Impairment Nursing Diagnoses: Knowledge deficit related to smoking impact on wound healing Goals: Ulcer/skin breakdown will have a volume reduction of 30% by week 4 Date Initiated: 09/21/2016 Target Resolution Date: 11/30/2016 Goal Status: Active Ulcer/skin breakdown will have a volume reduction of 50% by week 8 Date Initiated: 09/21/2016 Target Resolution Date: 11/30/2016 Goal Status: Active Ulcer/skin breakdown will have a volume reduction of 80% by week 12 Date Initiated: 09/21/2016 Target Resolution Date: 11/30/2016 Goal Status: Active Ulcer/skin breakdown will heal within 14 weeks Date Initiated: 09/21/2016 Target Resolution Date: 11/30/2016 Goal Status: Active Interventions: Assess patient/caregiver ability to obtain necessary supplies Assess patient/caregiver ability to perform ulcer/skin care regimen upon admission and as needed Assess ulceration(s) every visit Notes: Electronic Signature(s) Signed: 12/03/2016 4:25:47 PM By: [name] Entered By: [name] on 12/03/2016 14:03:03 Daponte, [name] ([id number]) -------------------------------------------------------------------------------- Pain Assessment Details Patient Name: [name] Brittle E. 12/03/2016 1:30 Date of Service: PM Medical Record [id number] Number: Patient Account Number: [id number] Date of Birth/Sex: [birth year] (66 y.o. Male) Treating RN: [name], Other Clinician: Primary  Care Jaskarn Schweer: [name] Treating [name], [name], Juel Bellerose/Extender: Referring Jarrid Lienhard: [name] in Treatment: 10 Active Problems Location of Pain Severity and Description of Pain Patient Has Paino No Site Locations Pain Management and Medication Current Pain Management: Notes Topical or injectable lidocaine is offered to patient for acute pain when surgical debridement is performed. If needed, Patient is instructed to use over the counter pain medication for the following 24-48 hours after debridement. Wound care MDs do not prescribed pain medications. Patient has chronic pain or uncontrolled pain. Patient has been instructed to make an appointment with their Primary Care Physician for pain management. Electronic Signature(s) Signed: 12/03/2016 4:25:47 PM By: [name] Entered By: [name] on 12/03/2016 13:35:55 Wentland, [name] ([id number]) -------------------------------------------------------------------------------- Patient/Caregiver Education Details Patient Name: [name], [name] 12/03/2016 1:30 Date of Service: PM Medical Record [id number] Number: Patient Account Number: [id number] Date of Birth/Gender: May 06, 1950 (66 y.o. Male) Treating RN: [name], Other Clinician: Primary Care Physician: [name] Treating [name], [name], Physician/Extender: Referring Physician: Harless [name] in Treatment: 10 Education Assessment Education Provided To: Patient Education Topics Provided Wound/Skin Impairment: Handouts: Other: wound care as ordered Methods: Explain/Verbal Responses: State content correctly Electronic Signature(s) Signed: 12/03/2016 4:25:47 PM By: [name] Entered By: [name] on 12/03/2016 15:11:39 Vanhise, [name] ([id number]) -------------------------------------------------------------------------------- Wound Assessment Details Patient Name: [name] Brittle E. 12/03/2016 1:30 Date of  Service: PM Medical Record [id number] Number: Patient Account Number: [id number] Date of Birth/Sex: [birth year] (66 y.o. Male) Treating RN: [name], Other Clinician: Primary Care Tanishia Lemaster: [name] Treating [name], [name], Lian Pounds/Extender: Referring Trason Shifflet: [name] in Treatment: 10 Wound Status Wound Number: 1 Primary Diabetic Wound/Ulcer of the Lower Etiology: Extremity Wound Location: Right Toe Great Wound Open Wounding Event: Gradually Appeared Status: Date Acquired: 06/11/2016 Comorbid Anemia, Lymphedema, Congestive Weeks Of Treatment: 10 History: Heart Failure, Hypertension, Peripheral Clustered Wound: No Venous Disease, Type II Diabetes, Pending Amputation On Presentation Neuropathy Photos Photo Uploaded By:  Gretta Cool, BSN, RN, CWS, Kim on 12/03/2016 14:30:40 Wound Measurements Length: (cm) 3 Width: (cm) 6 Depth: (cm) 0.2 Area: (cm) 14.137 Volume: (cm) 2.827 % Reduction in Area: 70% % Reduction in Volume: 40% Epithelialization: Large (67-100%) Tunneling: No Undermining: No Wound Description Classification: Grade 1 Foul Odor After Wound Margin: Flat and Intact Due to Product Exudate Amount: Large Slough/Fibrino Exudate Type: Serosanguineous Exudate Color: red, brown Cleansing: Yes Use: No Yes Wound Bed Ragin, Abdoulie E. ([id number]) Granulation Amount: Large (67-100%) Exposed Structure Granulation Quality: Pink Fascia Exposed: No Necrotic Amount: None Present (0%) Fat Layer (Subcutaneous Tissue) Exposed: Yes Tendon Exposed: No Muscle Exposed: No Joint Exposed: No Bone Exposed: No Periwound Skin Texture Texture Color No Abnormalities Noted: No No Abnormalities Noted: No Callus: No Atrophie Blanche: No Crepitus: No Cyanosis: No Excoriation: Yes Ecchymosis: No Induration: No Erythema: No Rash: No Hemosiderin Staining: No Scarring: No Mottled: No Pallor: No Moisture Rubor: No No Abnormalities Noted:  No Dry / Scaly: No Temperature / Pain Maceration: Yes Temperature: No Abnormality Wound Preparation Ulcer Cleansing: Rinsed/Irrigated with Saline Topical Anesthetic Applied: None Treatment Notes Wound #1 (Right Toe Great) 1. Cleansed with: Clean wound with Normal Saline 3. Peri-wound Care: Antifungal cream 4. Dressing Applied: Aquacel Ag 5. Secondary Dressing Applied Dry Gauze Kerlix/Conform 7. Secured with Tape Notes netting Electronic Signature(s) Signed: 12/03/2016 1:47:34 PM By: [name] Entered By: [name] on 12/03/2016 13:47:33 Kist, [name] ([id number]) Hannon, TAUREAN JU ([id number]) -------------------------------------------------------------------------------- Wound Assessment Details Patient Name: [name], [name] 12/03/2016 1:30 Date of Service: PM Medical Record [id number] Number: Patient Account Number: [id number] Date of Birth/Sex: 08-Mar-1951 (66 y.o. Male) Treating RN: [name], Other Clinician: Primary Care Sharron Petruska: [name] Treating [name], [name], Leyan Branden/Extender: Referring Spiro Ausborn: [name] in Treatment: 10 Wound Status Wound Number: 2 Primary Diabetic Wound/Ulcer of the Lower Etiology: Extremity Wound Location: Right Toe Second Wound Open Wounding Event: Gradually Appeared Status: Date Acquired: 06/11/2016 Comorbid Anemia, Lymphedema, Congestive Weeks Of Treatment: 10 History: Heart Failure, Hypertension, Peripheral Clustered Wound: No Venous Disease, Type II Diabetes, Pending Amputation On Presentation Neuropathy Photos Photo Uploaded By: Gretta Cool, BSN, RN, CWS, Kim on 12/03/2016 14:31:34 Wound Measurements Length: (cm) 1.2 Width: (cm) 1.5 Depth: (cm) 0.1 Area: (cm) 1.414 Volume: (cm) 0.141 % Reduction in Area: 54% % Reduction in Volume: 54.1% Epithelialization: Small (1-33%) Tunneling: No Undermining: No Wound Description Classification: Grade 1 Foul Odor After Wound Margin:  Flat and Intact Due to Product Exudate Amount: Large Slough/Fibrino Exudate Type: Serosanguineous Exudate Color: red, brown Cleansing: Yes Use: No Yes Wound Bed Zapanta, Cornie E. ([id number]) Granulation Amount: Large (67-100%) Exposed Structure Granulation Quality: Pink Fascia Exposed: No Necrotic Amount: None Present (0%) Fat Layer (Subcutaneous Tissue) Exposed: Yes Tendon Exposed: No Muscle Exposed: No Joint Exposed: No Bone Exposed: No Periwound Skin Texture Texture Color No Abnormalities Noted: No No Abnormalities Noted: No Callus: No Atrophie Blanche: No Crepitus: No Cyanosis: No Excoriation: Yes Ecchymosis: No Induration: No Erythema: No Rash: No Hemosiderin Staining: No Scarring: No Mottled: No Pallor: No Moisture Rubor: No No Abnormalities Noted: No Dry / Scaly: No Temperature / Pain Maceration: Yes Temperature: No Abnormality Wound Preparation Ulcer Cleansing: Rinsed/Irrigated with Saline Topical Anesthetic Applied: None Treatment Notes Wound #2 (Right Toe Second) 1. Cleansed with: Clean wound with Normal Saline 3. Peri-wound Care: Antifungal cream 4. Dressing Applied: Aquacel Ag 5. Secondary Dressing Applied Dry Gauze Kerlix/Conform 7. Secured with Tape Notes netting Electronic Signature(s) Signed: 12/03/2016 1:47:56 PM By: [name] Entered By: [name] on 12/03/2016  13:47:54 Dowis, JEREMIAS BROYHILL ([id number]) ZACKARI, RUANE ([id number]) -------------------------------------------------------------------------------- Wound Assessment Details Patient Name: [name], [name] 12/03/2016 1:30 Date of Service: PM Medical Record [id number] Number: Patient Account Number: [id number] Date of Birth/Sex: 22-May-1950 (65 y.o. Male) Treating RN: [name], Other Clinician: Primary Care Takoya Jonas: [name] Treating [name], [name], Makeila Yamaguchi/Extender: Referring Langdon Crosson: [name] in Treatment: 10 Wound  Status Wound Number: 3 Primary Diabetic Wound/Ulcer of the Lower Etiology: Extremity Wound Location: Right Toe Third Wound Status: Healed - Epithelialized Wounding Event: Gradually Appeared Date Acquired: 06/11/2016 Weeks Of Treatment: 10 Clustered Wound: No Pending Amputation On Presentation Photos Photo Uploaded By: Gretta Cool, BSN, RN, CWS, Kim on 12/03/2016 14:31:36 Wound Measurements Length: (cm) 0 Width: (cm) 0 Depth: (cm) 0 Area: (cm) 0 Volume: (cm) 0 % Reduction in Area: 100% % Reduction in Volume: 100% Wound Description Classification: Grade 1 Periwound Skin Texture Texture Color No Abnormalities Noted: No No Abnormalities Noted: No Moisture No Abnormalities Noted: No Longley, MAKALE PINDELL ([id number]) Electronic Signature(s) Signed: 12/03/2016 4:25:47 PM By: [name] Entered By: [name] on 12/03/2016 13:43:25 Sanluis, [name] ([id number]) -------------------------------------------------------------------------------- Wound Assessment Details Patient Name: [name] Brittle E. 12/03/2016 1:30 Date of Service: PM Medical Record [id number] Number: Patient Account Number: [id number] Date of Birth/Sex: Sep 08, 1950 (66 y.o. Male) Treating RN: [name], Other Clinician: Primary Care Jerald Hennington: [name] Treating [name], [name], Ines Rebel/Extender: Referring Jolee Critcher: [name] in Treatment: 10 Wound Status Wound Number: 4 Primary Diabetic Wound/Ulcer of the Lower Etiology: Extremity Wound Location: Right Foot - Dorsal Wound Open Wounding Event: Gradually Appeared Status: Date Acquired: 06/11/2016 Comorbid Anemia, Lymphedema, Congestive Weeks Of Treatment: 10 History: Heart Failure, Hypertension, Peripheral Clustered Wound: No Venous Disease, Type II Diabetes, Pending Amputation On Presentation Neuropathy Photos Photo Uploaded By: Gretta Cool, BSN, RN, CWS, Kim on 12/03/2016 14:32:25 Wound Measurements Length: (cm) 0.7 Width:  (cm) 0.5 Depth: (cm) 0.1 Area: (cm) 0.275 Volume: (cm) 0.027 % Reduction in Area: 93.8% % Reduction in Volume: 93.9% Epithelialization: None Tunneling: No Undermining: No Wound Description Classification: Grade 1 Foul Odor After Wound Margin: Distinct, outline attached Due to Product Exudate Amount: Large Exudate Type: Serous Exudate Color: amber Cleansing: Yes Use: No Wound Bed Loveless, Derreck E. ([id number]) Granulation Amount: Large (67-100%) Exposed Structure Granulation Quality: Red Fascia Exposed: No Necrotic Amount: None Present (0%) Fat Layer (Subcutaneous Tissue) Exposed: No Tendon Exposed: No Muscle Exposed: No Joint Exposed: No Bone Exposed: No Periwound Skin Texture Texture Color No Abnormalities Noted: No No Abnormalities Noted: No Callus: No Atrophie Blanche: No Crepitus: No Cyanosis: No Excoriation: Yes Ecchymosis: No Induration: No Erythema: No Rash: No Hemosiderin Staining: No Scarring: No Mottled: No Pallor: No Moisture Rubor: No No Abnormalities Noted: No Dry / Scaly: No Temperature / Pain Maceration: No Temperature: No Abnormality Wound Preparation Ulcer Cleansing: Rinsed/Irrigated with Saline Topical Anesthetic Applied: None Treatment Notes Wound #4 (Right, Dorsal Foot) 1. Cleansed with: Clean wound with Normal Saline 3. Peri-wound Care: Antifungal cream 4. Dressing Applied: Aquacel Ag 5. Secondary Dressing Applied Dry Gauze Kerlix/Conform 7. Secured with Tape Notes netting Electronic Signature(s) Signed: 12/03/2016 1:48:32 PM By: [name] Entered By: [name] on 12/03/2016 13:48:31 [name], [name] ([id number]) Lacaze, JD MCCASTER ([id number]) -------------------------------------------------------------------------------- Wound Assessment Details Patient Name: [name], [name] 12/03/2016 1:30 Date of Service: PM Medical Record [id number] Number: Patient Account Number: [id number] Date of Birth/Sex: August 02, 1950 (66  y.o. Male) Treating RN: [name], Other Clinician: Primary Care Javonna Balli: [name] Treating [name], [name], Oralia Criger/Extender: Referring Toshua Honsinger: [name] in Treatment:  10 Wound Status Wound Number: 5 Primary Diabetic Wound/Ulcer of the Lower Etiology: Extremity Wound Location: Left Toe Great Wound Open Wounding Event: Gradually Appeared Status: Date Acquired: 06/11/2016 Comorbid Anemia, Lymphedema, Congestive Weeks Of Treatment: 10 History: Heart Failure, Hypertension, Peripheral Clustered Wound: No Venous Disease, Type II Diabetes, Pending Amputation On Presentation Neuropathy Photos Photo Uploaded By: Gretta Cool, BSN, RN, CWS, Kim on 12/03/2016 14:32:26 Wound Measurements Length: (cm) 1 Width: (cm) 0.9 Depth: (cm) 0.1 Area: (cm) 0.707 Volume: (cm) 0.071 % Reduction in Area: 95.1% % Reduction in Volume: 95% Epithelialization: Small (1-33%) Tunneling: No Undermining: No Wound Description Classification: Grade 1 Foul Odor After Wound Margin: Flat and Intact Due to Product Exudate Amount: Large Slough/Fibrino Exudate Type: Serous Exudate Color: amber Cleansing: Yes Use: No Yes Wound Bed Blasius, Maleke E. ([id number]) Granulation Amount: Large (67-100%) Exposed Structure Granulation Quality: Pink Fascia Exposed: No Necrotic Amount: None Present (0%) Fat Layer (Subcutaneous Tissue) Exposed: Yes Tendon Exposed: No Muscle Exposed: No Joint Exposed: No Bone Exposed: No Periwound Skin Texture Texture Color No Abnormalities Noted: No No Abnormalities Noted: No Callus: No Atrophie Blanche: No Crepitus: No Cyanosis: No Excoriation: Yes Ecchymosis: No Induration: No Erythema: No Rash: No Hemosiderin Staining: No Scarring: No Mottled: No Pallor: No Moisture Rubor: No No Abnormalities Noted: No Dry / Scaly: No Temperature / Pain Maceration: No Temperature: No Abnormality Wound Preparation Ulcer Cleansing:  Rinsed/Irrigated with Saline Topical Anesthetic Applied: None Treatment Notes Wound #5 (Left Toe Great) 1. Cleansed with: Clean wound with Normal Saline 3. Peri-wound Care: Antifungal cream 4. Dressing Applied: Aquacel Ag 5. Secondary Dressing Applied Dry Gauze Kerlix/Conform 7. Secured with Tape Notes netting Electronic Signature(s) Signed: 12/03/2016 1:49:01 PM By: [name] Entered By: [name] on 12/03/2016 13:49:00 Stefanko, JOSIAH NIETO ([id number]) Reedy, LEVERETT CAMPLIN ([id number]) -------------------------------------------------------------------------------- Wound Assessment Details Patient Name: [name], [name] 12/03/2016 1:30 Date of Service: PM Medical Record [id number] Number: Patient Account Number: [id number] Date of Birth/Sex: 10-Apr-1950 (66 y.o. Male) Treating RN: [name], Other Clinician: Primary Care Zanita Millman: [name] Treating [name], [name], Shaundrea Carrigg/Extender: Referring Shaunice Levitan: [name] in Treatment: 10 Wound Status Wound Number: 6 Primary Diabetic Wound/Ulcer of the Lower Etiology: Extremity Wound Location: Left Toe Second Wound Open Wounding Event: Gradually Appeared Status: Date Acquired: 06/11/2016 Comorbid Anemia, Lymphedema, Congestive Weeks Of Treatment: 10 History: Heart Failure, Hypertension, Peripheral Clustered Wound: No Venous Disease, Type II Diabetes, Pending Amputation On Presentation Neuropathy Photos Photo Uploaded By: Gretta Cool, BSN, RN, CWS, Kim on 12/03/2016 14:33:14 Wound Measurements Length: (cm) 0.5 Width: (cm) 0.5 Depth: (cm) 0.1 Area: (cm) 0.196 Volume: (cm) 0.02 % Reduction in Area: 90.8% % Reduction in Volume: 90.7% Epithelialization: Small (1-33%) Tunneling: No Undermining: No Wound Description Classification: Grade 1 Foul Odor After Wound Margin: Flat and Intact Due to Product Exudate Amount: Large Slough/Fibrino Exudate Type: Serous Exudate Color:  amber Cleansing: Yes Use: No Yes Wound Bed Schwartzman, Robb E. ([id number]) Granulation Amount: Large (67-100%) Exposed Structure Granulation Quality: Pink Fascia Exposed: No Necrotic Amount: None Present (0%) Fat Layer (Subcutaneous Tissue) Exposed: Yes Tendon Exposed: No Muscle Exposed: No Joint Exposed: No Bone Exposed: No Periwound Skin Texture Texture Color No Abnormalities Noted: No No Abnormalities Noted: No Callus: No Atrophie Blanche: No Crepitus: No Cyanosis: No Excoriation: Yes Ecchymosis: No Induration: No Erythema: No Rash: No Hemosiderin Staining: No Scarring: No Mottled: No Pallor: No Moisture Rubor: No No Abnormalities Noted: No Dry / Scaly: No Temperature / Pain Maceration: Yes Temperature: No Abnormality Wound Preparation Ulcer Cleansing: Rinsed/Irrigated with Saline  Topical Anesthetic Applied: None Treatment Notes Wound #6 (Left Toe Second) 1. Cleansed with: Clean wound with Normal Saline 3. Peri-wound Care: Antifungal cream 4. Dressing Applied: Aquacel Ag 5. Secondary Dressing Applied Dry Gauze Kerlix/Conform 7. Secured with Tape Notes netting Electronic Signature(s) Signed: 12/03/2016 1:49:20 PM By: [name] Entered By: [name] on 12/03/2016 13:49:18 Jeter, MATHEU PLOEGER ([id number]) Donate, TROY KANOUSE ([id number]) -------------------------------------------------------------------------------- Wound Assessment Details Patient Name: [name], [name] 12/03/2016 1:30 Date of Service: PM Medical Record [id number] Number: Patient Account Number: [id number] Date of Birth/Sex: 05-Feb-1951 (66 y.o. Male) Treating RN: [name], Other Clinician: Primary Care Lyle Leisner: [name] Treating [name], [name], Raivyn Kabler/Extender: Referring Xiomara Sevillano: [name] in Treatment: 10 Wound Status Wound Number: 7 Primary Diabetic Wound/Ulcer of the Lower Etiology: Extremity Wound Location: Left Toe Third Secondary  Pressure Ulcer Wounding Event: Gradually Appeared Etiology: Date Acquired: 10/14/2016 Wound Open Weeks Of Treatment: 7 Status: Clustered Wound: No Comorbid Anemia, Lymphedema, Congestive History: Heart Failure, Hypertension, Peripheral Venous Disease, Type II Diabetes, Neuropathy Photos Photo Uploaded By: Gretta Cool, BSN, RN, CWS, Kim on 12/03/2016 14:33:15 Wound Measurements Length: (cm) 0.5 Width: (cm) 0.4 Depth: (cm) 0.1 Area: (cm) 0.157 Volume: (cm) 0.016 % Reduction in Area: 89.8% % Reduction in Volume: 89.5% Epithelialization: None Tunneling: No Undermining: No Wound Description Classification: Grade 1 Foul Odor After Wound Margin: Flat and Intact Due to Product Exudate Amount: Large Slough/Fibrino Exudate Type: Serous Exudate Color: amber Termini, Sheffield E. ([id number]) Cleansing: Yes Use: No Yes Wound Bed Granulation Amount: Large (67-100%) Exposed Structure Granulation Quality: Red Fascia Exposed: No Necrotic Amount: None Present (0%) Fat Layer (Subcutaneous Tissue) Exposed: Yes Tendon Exposed: No Muscle Exposed: No Joint Exposed: No Bone Exposed: No Periwound Skin Texture Texture Color No Abnormalities Noted: No No Abnormalities Noted: No Callus: No Atrophie Blanche: No Crepitus: No Cyanosis: No Excoriation: Yes Ecchymosis: No Induration: No Erythema: No Rash: No Hemosiderin Staining: No Scarring: No Mottled: No Pallor: No Moisture Rubor: No No Abnormalities Noted: No Dry / Scaly: No Temperature / Pain Maceration: No Temperature: No Abnormality Wound Preparation Ulcer Cleansing: Rinsed/Irrigated with Saline Topical Anesthetic Applied: None Treatment Notes Wound #7 (Left Toe Third) 1. Cleansed with: Clean wound with Normal Saline 3. Peri-wound Care: Antifungal cream 4. Dressing Applied: Aquacel Ag 5. Secondary Dressing Applied Dry Gauze Kerlix/Conform 7. Secured with Tape Notes netting Electronic Signature(s) Signed: 12/03/2016  1:49:44 PM By: Deniece Ree, [name] ([id number]) Entered By: [name] on 12/03/2016 13:49:43 Wirtz, CHERYL STABENOW ([id number]) -------------------------------------------------------------------------------- Vitals Details Patient Name: ISAUL, LANDI 12/03/2016 1:30 Date of Service: PM Medical Record [id number] Number: Patient Account Number: [id number] Date of Birth/Sex: June 28, 1950 (66 y.o. Male) Treating RN: [name], Other Clinician: Primary Care Alston Berrie: [name] Treating [name], [name], Danuel Felicetti/Extender: Referring Carrah Eppolito: [name] in Treatment: 10 Vital Signs Time Taken: 13:39 Temperature (F): 97.8 Height (in): 69 Pulse (bpm): 66 Weight (lbs): 168 Respiratory Rate (breaths/min): 16 Body Mass Index (BMI): 24.8 Blood Pressure (mmHg): 158/75 Reference Range: 80 - 120 mg / dl Electronic Signature(s) Signed: 12/03/2016 4:25:47 PM By: [name] Entered By: [name] on 12/03/2016 13:39:44

## 2016-12-04 NOTE — Progress Notes (Addendum)
TODDRICK, SANNA ([id number]) Visit Report for 12/03/2016 Chief Complaint Document Details Patient Name: [name], [name] 12/03/2016 1:30 Date of Service: PM Medical Record [id number] Number: Patient Account Number: [id number] Date of Birth/Sex: [birth year] (66 y.o. Male) Treating RN: Elyn Peers, Other Clinician: Primary Care Provider: Olivia Mackie Treating Kelon Easom, Cherie Ouch, Provider/Extender: Referring Provider: Harless Nakayama in Treatment: 10 Information Obtained from: Patient Chief Complaint Patients presents for treatment of an open diabetic ulcer to both feet Electronic Signature(s) Signed: 12/03/2016 2:50:55 PM By: Christin Fudge MD, FACS Entered By: Christin Fudge on 12/03/2016 14:16:26 Roll, [name] ([id number]) -------------------------------------------------------------------------------- HPI Details Patient Name: [name], [name] 12/03/2016 1:30 Date of Service: PM Medical Record [id number] Number: Patient Account Number: [id number] Date of Birth/Sex: Jan 24, 1951 (66 y.o. Male) Treating RN: Elyn Peers, Other Clinician: Primary Care Provider: Olivia Mackie Treating Skarlet Lyons, Cherie Ouch, Provider/Extender: Referring Provider: Harless Nakayama in Treatment: 10 History of Present Illness Location: bilateral feet ulceration on the toes Quality: Patient reports experiencing a dull pain to affected area(s). Severity: Patient states wound are getting better Duration: Patient has had the wound for > 3 months prior to seeking treatment at the wound center Timing: Pain in wound is constant (hurts all the time) Context: The wound would happen gradually Modifying Factors: Other treatment(s) tried include:treatment for lymphedema and is seen by the podiatrist Dr. Caryl Comes Associated Signs and Symptoms: Patient reports having increase swelling. HPI Description: 66 year old patient here to see as for bilateral feet ulceration to on his left first  and second toe and 2 on his right first and second toe, which she's had for about 4 months. 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 Donella Stade. The patient is also undergoing workup by medical oncology for a intra-abdominal lymphadenopathy. 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 He is also status post appendectomy, inguinal lymph node biopsy, rectal biopsy and rectal examination under anesthesia. he currently smokes cigarettes about half packet a day. In March of this year he was seen by Dr. Hortencia Pilar, for evaluation of bilateral varicose veins and besides wearing compression stockings he had recommended laser ablation of the right and left great saphenous veins to eleviate the symptoms and complications of severe superficial venous reflux disease. He also recommended lymphedema pumps for better control of his lymphedema. The patient recently has had on 08/23/2016, right greater saphenous vein ablation with the laser energy Earlier lower extremity venous reflux examination done on 05/08/2016 showed no DVT or SVT both lower legs but incompetence of bilateral great saphenous veins was present. A lower arterial study was also done and there was no significant right lower and left lower extremity problems based on a normal toe brachial index bilaterally and the ABI was 1.221 the left and 1.23 on the right. His post ablation venous duplex examination showed successful ablation of the right GS vein with thrombus formation 2 below the right saphenofemoral junction. The deep system was patent without evidence of thrombosis and this was done on 08/30/2016. the patient also has a squamous cell cancer of the skin of the buttock and is recently  undergone radiation therapy for this prior to excisional surgery. Addendum: regarding his x-rays done today and x-ray of the left foot -- IMPRESSION: No objective Ostermiller, Meshilem E. ([id number]) evidence of osteomyelitis.  There are soft tissue changes which may reflect cellulitis. X-ray of the right foot -- IMPRESSION:Findings compatible with cellulitis of the toes. No objective evidence of osteomyelitis is observed. 10/01/16 on evaluation today patient's wounds appeared to be doing some better. I did review the x-rays as well which showed no evidence of osteomyelitis although there was evidence on x-ray of cellulitis. He fortunately is not having any discomfort although he continues to have some swelling. He does not remember being on any antibiotics recently. 10/15/16 on evaluation today patient's wounds overall appear to be doing better although he does have a new location noted on the left foot. Fortunately he is not having significant pain. It almost has the appearance that something is rubbing on the end of his toes but he wears the open toe shoes and according to what he is telling me never wears anything that would rub on his foot. There is no evidence of infection and specifically no evidence of a fungal infection 10/22/16 On evaluation today patient's wounds appeared to be doing better compared to last week in regard to his bilateral lower extremities. Fortunately I happy with how things are progressing although he still has ulcers I feel like that he is improving and appropriate manner. 11/12/16 on evaluation today patient appears to be doing well in regard to his bilateral feet and the respective wounds. We have been using surrounding her dressings along with an antifungal cream which seems to be doing very well. He has no bilateral dysfunction noticed that the rituals are weight loss at this point. He also has no nausea or vomiting a note purulent discharge. He did see Vein and vascular  today and he tells me that they told him he could have surgery for his venous stasis but they did not feel like it was worth it in his words. Fortunately patient's wounds do appear to be getting sneakily better. 11/26/2016 -- he says he is going to have some surgery during this week at [facility] for possibly a colon resection Electronic Signature(s) Signed: 12/03/2016 2:50:55 PM By: Christin Fudge MD, FACS Entered By: Christin Fudge on 12/03/2016 14:16:37 Colson, [name] ([id number]) -------------------------------------------------------------------------------- Physical Exam Details Patient Name: NICKSON, MIDDLESWORTH 12/03/2016 1:30 Date of Service: PM Medical Record [id number] Number: Patient Account Number: [id number] Date of Birth/Sex: Apr 20, 1950 (66 y.o. Male) Treating RN: Elyn Peers, Other Clinician: Primary Care Provider: Olivia Mackie Treating Neveah Bang, Cherie Ouch, Provider/Extender: Referring Provider: Harless Nakayama in Treatment: 10 Constitutional . Pulse regular. Respirations normal and unlabored. Afebrile. . Eyes Nonicteric. Reactive to light. Ears, Nose, Mouth, and Throat Lips, teeth, and gums WNL.Marland Kitchen Moist mucosa without lesions. Neck supple and nontender. No palpable supraclavicular or cervical adenopathy. Normal sized without goiter. Respiratory WNL. No retractions.. Cardiovascular Pedal Pulses WNL. No clubbing, cyanosis or edema. Lymphatic No adneopathy. No adenopathy. No adenopathy. Musculoskeletal Adexa without tenderness or enlargement.. Digits and nails w/o clubbing, cyanosis, infection, petechiae, ischemia, or inflammatory conditions.. Integumentary (Hair, Skin) No suspicious lesions. No crepitus or fluctuance. No peri-wound warmth or erythema. No masses.Marland Kitchen Psychiatric Judgement and insight Intact.. No evidence of depression, anxiety, or agitation.. Notes the wounds are looking very good today and some of them have completely healed. The  fungal infection, is also much better and no sharp debridement was required today. Electronic Signature(s) Signed: 12/03/2016 2:50:55 PM By: Christin Fudge MD, FACS Entered By: Christin Fudge on 12/03/2016 14:17:09 Rendell, [name] ([id number]) -------------------------------------------------------------------------------- Physician Orders Details Patient Name: [name] Brittle E. 12/03/2016 1:30 Date of Service: PM  Medical Record [id number] Number: Patient Account Number: [id number] Date of Birth/Sex: 11-Jun-1950 (66 y.o. Male) Treating RN: Elyn Peers, Other Clinician: Primary Care Provider: Olivia Mackie Treating Tennyson Wacha, Cherie Ouch, Provider/Extender: Referring Provider: Harless Nakayama in Treatment: 10 Verbal / Phone Orders: No Diagnosis Coding Wound Cleansing Wound #1 Right Toe Great o Cleanse wound with mild soap and water o May Shower, gently pat wound dry prior to applying new dressing. Wound #2 Right Toe Second o Cleanse wound with mild soap and water o May Shower, gently pat wound dry prior to applying new dressing. Wound #4 Right,Dorsal Foot o Cleanse wound with mild soap and water o May Shower, gently pat wound dry prior to applying new dressing. Wound #5 Left Toe Great o Cleanse wound with mild soap and water o May Shower, gently pat wound dry prior to applying new dressing. Wound #6 Left Toe Second o Cleanse wound with mild soap and water o May Shower, gently pat wound dry prior to applying new dressing. Wound #7 Left Toe Third o Cleanse wound with mild soap and water o May Shower, gently pat wound dry prior to applying new dressing. Anesthetic Wound #1 Right Toe Great o Topical Lidocaine 4% cream applied to wound bed prior to debridement Wound #2 Right Toe Second o Topical Lidocaine 4% cream applied to wound bed prior to debridement Wound #4 Right,Dorsal Foot o Topical Lidocaine 4% cream applied to wound bed prior  to debridement Lybeck, Amman E. ([id number]) Wound #5 Left Toe Great o Topical Lidocaine 4% cream applied to wound bed prior to debridement Wound #6 Left Toe Second o Topical Lidocaine 4% cream applied to wound bed prior to debridement Wound #7 Left Toe Third o Topical Lidocaine 4% cream applied to wound bed prior to debridement Skin Barriers/Peri-Wound Care Wound #1 Right Toe Great o Antifungal cream - on and between toes then AqAg over areas Wound #2 Right Toe Second o Antifungal cream - on and between toes then AqAg over areas Wound #4 Right,Dorsal Foot o Antifungal cream - on and between toes then AqAg over areas Wound #5 Left Toe Great o Antifungal cream - on and between toes then AqAg over areas Wound #6 Left Toe Second o Antifungal cream - on and between toes then AqAg over areas Wound #7 Left Toe Third o Antifungal cream - on and between toes then AqAg over areas Primary Wound Dressing Wound #1 Right Toe Great o Aquacel Ag Wound #2 Right Toe Second o Aquacel Ag Wound #4 Right,Dorsal Foot o Aquacel Ag Wound #5 Left Toe Great o Aquacel Ag Wound #6 Left Toe Second o Aquacel Ag Wound #7 Left Toe Third o Aquacel Ag Ernster, Ramone E. ([id number]) Secondary Dressing Wound #1 Right Toe Great o ABD pad o Gauze and Kerlix/Conform Wound #2 Right Toe Second o ABD pad o Gauze and Kerlix/Conform Wound #4 Right,Dorsal Foot o ABD pad o Gauze and Kerlix/Conform Wound #5 Left Toe Great o ABD pad o Gauze and Kerlix/Conform Wound #6 Left Toe Second o ABD pad o Gauze and Kerlix/Conform Wound #7 Left Toe Third o ABD pad o Gauze and Kerlix/Conform Dressing Change Frequency Wound #1 Right Toe Great o Change dressing every day. Wound #2 Right Toe Second o Change dressing every day. Wound #4 Right,Dorsal Foot o Change dressing every day. Wound #5 Left Toe Great o Change dressing every day. Wound #6 Left Toe  Second o Change dressing every day. Wound #7 Left Toe Third o Change dressing every day. Follow-up Appointments Wound #  1 Right Toe Great o Return Appointment in 1 week. Wound #2 Right Toe Second Gamm, Reinhard E. ([id number]) o Return Appointment in 1 week. Wound #4 Right,Dorsal Foot o Return Appointment in 1 week. Wound #5 Left Toe Great o Return Appointment in 1 week. Wound #6 Left Toe Second o Return Appointment in 1 week. Wound #7 Left Toe Third o Return Appointment in 1 week. Edema Control Wound #1 Right Toe Great o Patient to wear own compression stockings o Elevate legs to the level of the heart and pump ankles as often as possible Wound #2 Right Toe Second o Patient to wear own compression stockings o Elevate legs to the level of the heart and pump ankles as often as possible Wound #4 Right,Dorsal Foot o Patient to wear own compression stockings o Elevate legs to the level of the heart and pump ankles as often as possible Wound #5 Left Toe Great o Patient to wear own compression stockings o Elevate legs to the level of the heart and pump ankles as often as possible Wound #6 Left Toe Second o Patient to wear own compression stockings o Elevate legs to the level of the heart and pump ankles as often as possible Wound #7 Left Toe Third o Patient to wear own compression stockings o Elevate legs to the level of the heart and pump ankles as often as possible Additional Orders / Instructions Wound #1 Right Toe Great o Stop Smoking - Please continue to stay away from alcohol o Other: - Please add vitamin A, vitamin C, and Zinc supplements to your diet Wound #2 Right Toe Second o Stop Smoking - Please continue to stay away from alcohol o Other: - Please add vitamin A, vitamin C, and Zinc supplements to your diet Meetze, Stevie E. ([id number]) Wound #4 Right,Dorsal Foot o Stop Smoking - Please continue to stay away from  alcohol o Other: - Please add vitamin A, vitamin C, and Zinc supplements to your diet Wound #5 Left Toe Great o Stop Smoking - Please continue to stay away from alcohol o Other: - Please add vitamin A, vitamin C, and Zinc supplements to your diet Wound #6 Left Toe Second o Stop Smoking - Please continue to stay away from alcohol o Other: - Please add vitamin A, vitamin C, and Zinc supplements to your diet Wound #7 Left Toe Third o Stop Smoking - Please continue to stay away from alcohol o Other: - Please add vitamin A, vitamin C, and Zinc supplements to your diet Home Health Wound #1 Right Toe Great o D/C Home Health Services - per patient request Wound #2 Right Toe Second o D/C Home Health Services - per patient request Wound #4 Right,Dorsal Foot o D/C Home Health Services - per patient request Wound #5 Left Toe Great o D/C Home Health Services - per patient request Wound #6 Left Toe Second o D/C Home Health Services - per patient request Wound #7 Left Toe Third o D/C Home Health Services - per patient request Electronic Signature(s) Signed: 12/13/2016 8:03:09 AM By: Christin Fudge MD, FACS Signed: 12/18/2016 8:11:33 AM By: Gretta Cool, BSN, RN, CWS, Kim RN, BSN Previous Signature: 12/03/2016 2:50:55 PM Version By: Christin Fudge MD, FACS Previous Signature: 12/03/2016 4:25:47 PM Version By: Montey Hora Entered By: Gretta Cool, BSN, RN, CWS, Kim on 12/12/2016 09:53:42 Traber, [name] ([id number]) -------------------------------------------------------------------------------- Problem List Details Patient Name: [name], [name] 12/03/2016 1:30 Date of Service: PM Medical Record [id number] Number: Patient Account Number: [id number] Date of Birth/Sex: November 09, 1950 (  66 y.o. Male) Treating RN: Dorthy, Carney Living, Other Clinician: Primary Care Provider: TRACY Treating Krisandra Bueno, Cherie Ouch, Provider/Extender: Referring Provider: Harless Nakayama in  Treatment: 10 Active Problems ICD-10 Encounter Code Description Active Date Diagnosis E11.621 Type 2 diabetes mellitus with foot ulcer 09/21/2016 Yes I87.313 Chronic venous hypertension (idiopathic) with ulcer of 09/21/2016 Yes bilateral lower extremity I89.0 Lymphedema, not elsewhere classified 09/21/2016 Yes L97.522 Non-pressure chronic ulcer of other part of left foot with fat 09/21/2016 Yes layer exposed L97.512 Non-pressure chronic ulcer of other part of right foot with 09/21/2016 Yes fat layer exposed F17.218 Nicotine dependence, cigarettes, 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 12/03/2016 1:30 Date of Service: PM Medical Record [id number] Number: Patient Account Number: [id number] Date of Birth/Sex: [birth year] (66 y.o. Male) Treating RN: Elyn Peers, Other Clinician: Primary Care Provider: Olivia Mackie Treating Terrez Ander, Cherie Ouch, Provider/Extender: Referring Provider: Harless Nakayama in Treatment: 10 Subjective Chief Complaint Information obtained from Patient Patients presents for treatment of an open diabetic ulcer to both feet History of Present Illness (HPI) The following HPI elements were documented for the patient's wound: Location: bilateral feet ulceration on the toes Quality: Patient reports experiencing a dull pain to affected area(s). Severity: Patient states wound are getting better Duration: Patient has had the wound for > 3 months prior to seeking treatment at the wound center Timing: Pain in  wound is constant (hurts all the time) Context: The wound would happen gradually Modifying Factors: Other treatment(s) tried include:treatment for lymphedema and is seen by the podiatrist Dr. Caryl Comes Associated Signs and Symptoms: Patient reports having increase swelling. 66 year old patient here to see as for bilateral feet ulceration to on his left first and second toe and 2 on his right first and second toe, which she's had for about 4 months. 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 Donella Stade. The patient is also undergoing workup by medical oncology for a intra- abdominal lymphadenopathy. 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 He is also status post appendectomy, inguinal lymph node biopsy, rectal biopsy and rectal examination under anesthesia. he currently smokes cigarettes about half packet a day. In March of this year he was seen by Dr. Hortencia Pilar, for evaluation of bilateral varicose veins and besides wearing compression stockings he had recommended laser ablation of the right and left great saphenous veins to eleviate the symptoms and complications of severe superficial venous reflux disease. He also recommended lymphedema pumps for better control of his lymphedema. The patient recently has had on 08/23/2016, right greater saphenous vein ablation with the laser energy Earlier lower extremity venous reflux examination done on 05/08/2016 showed no DVT or SVT both lower legs but incompetence of bilateral great saphenous veins was present. A lower arterial study was also done and there was no significant right lower and left lower extremity problems based on a normal toe brachial index bilaterally and the ABI was 1.221 the left and 1.23  on the Benett, Nedim E. ([id number]) right. His post ablation venous duplex examination  showed successful ablation of the right GS vein with thrombus formation 2 below the right saphenofemoral junction. The deep system was patent without evidence of thrombosis and this was done on 08/30/2016. 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 Addendum: regarding his x-rays done today and x-ray of the left foot -- IMPRESSION: No objective evidence of osteomyelitis. There are soft tissue changes which may reflect cellulitis. X-ray of the right foot -- IMPRESSION:Findings compatible with cellulitis of the toes. No objective evidence of osteomyelitis is observed. 10/01/16 on evaluation today patient's wounds appeared to be doing some better. I did review the x-rays as well which showed no evidence of osteomyelitis although there was evidence on x-ray of cellulitis. He fortunately is not having any discomfort although he continues to have some swelling. He does not remember being on any antibiotics recently. 10/15/16 on evaluation today patient's wounds overall appear to be doing better although he does have a new location noted on the left foot. Fortunately he is not having significant pain. It almost has the appearance that something is rubbing on the end of his toes but he wears the open toe shoes and according to what he is telling me never wears anything that would rub on his foot. There is no evidence of infection and specifically no evidence of a fungal infection 10/22/16 On evaluation today patient's wounds appeared to be doing better compared to last week in regard to his bilateral lower extremities. Fortunately I happy with how things are progressing although he still has ulcers I feel like that he is improving and appropriate manner. 11/12/16 on evaluation today patient appears to be doing well  in regard to his bilateral feet and the respective wounds. We have been using surrounding her dressings along with an antifungal cream which seems to be doing very well. He has no bilateral dysfunction noticed that the rituals are weight loss at this point. He also has no nausea or vomiting a note purulent discharge. He did see Vein and vascular today and he tells me that they told him he could have surgery for his venous stasis but they did not feel like it was worth it in his words. Fortunately patient's wounds do appear to be getting sneakily better. 11/26/2016 -- he says he is going to have some surgery during this week at [facility] for possibly a colon resection Objective Constitutional Pulse regular. Respirations normal and unlabored. Afebrile. MENASHE, KAFER ([id number]) Vitals Time Taken: 1:39 PM, Height: 69 in, Weight: 168 lbs, BMI: 24.8, Temperature: 97.8 F, Pulse: 66 bpm, Respiratory Rate: 16 breaths/min, Blood Pressure: 158/75 mmHg. Eyes Nonicteric. Reactive to light. Ears, Nose, Mouth, and Throat Lips, teeth, and gums WNL.Marland Kitchen Moist mucosa without lesions. Neck supple and nontender. No palpable supraclavicular or cervical adenopathy. Normal sized without goiter. Respiratory WNL. No retractions.. Cardiovascular Pedal Pulses WNL. No clubbing, cyanosis or edema. Lymphatic No adneopathy. No adenopathy. No adenopathy. Musculoskeletal Adexa without tenderness or enlargement.. Digits and nails w/o clubbing, cyanosis, infection, petechiae, ischemia, or inflammatory conditions.Marland Kitchen Psychiatric Judgement and insight Intact.. No evidence of depression, anxiety, or agitation.. General Notes: the wounds are looking very good today and some of them have completely healed. The fungal infection, is also much better and no sharp debridement was required today. Integumentary (Hair, Skin) No suspicious lesions. No crepitus or fluctuance. No peri-wound warmth or erythema. No  masses.. Wound #1 status is  Open. Original cause of wound was Gradually Appeared. The wound is located on the Right Toe Great. The wound measures 3cm length x 6cm width x 0.2cm depth; 14.137cm^2 area and 2.827cm^3 volume. There is Fat Layer (Subcutaneous Tissue) Exposed exposed. There is no tunneling or undermining noted. There is a large amount of serosanguineous drainage noted. Foul odor after cleansing was noted. The wound margin is flat and intact. There is large (67-100%) pink granulation within the wound bed. There is no necrotic tissue within the wound bed. The periwound skin appearance exhibited: Excoriation, Maceration. The periwound skin appearance did not exhibit: Callus, Crepitus, Induration, Rash, Scarring, Dry/Scaly, Atrophie Blanche, Cyanosis, Ecchymosis, Hemosiderin Staining, Mottled, Pallor, Rubor, Erythema. Periwound temperature was noted as No Abnormality. Wound #2 status is Open. Original cause of wound was Gradually Appeared. The wound is located on the Right Toe Second. The wound measures 1.2cm length x 1.5cm width x 0.1cm depth; 1.414cm^2 area and 0.141cm^3 volume. There is Fat Layer (Subcutaneous Tissue) Exposed exposed. There is no tunneling or undermining noted. There is a large amount of serosanguineous drainage noted. Foul odor after cleansing was noted. The wound margin is flat and intact. There is large (67-100%) pink granulation within the wound [place], Chaitanya E. ([id number]) bed. There is no necrotic tissue within the wound bed. The periwound skin appearance exhibited: Excoriation, Maceration. The periwound skin appearance did not exhibit: Callus, Crepitus, Induration, Rash, Scarring, Dry/Scaly, Atrophie Blanche, Cyanosis, Ecchymosis, Hemosiderin Staining, Mottled, Pallor, Rubor, Erythema. Periwound temperature was noted as No Abnormality. Wound #3 status is Healed - Epithelialized. Original cause of wound was Gradually Appeared. The wound is located on the Right Toe  Third. The wound measures 0cm length x 0cm width x 0cm depth; 0cm^2 area and 0cm^3 volume. Wound #4 status is Open. Original cause of wound was Gradually Appeared. The wound is located on the Right,Dorsal Foot. The wound measures 0.7cm length x 0.5cm width x 0.1cm depth; 0.275cm^2 area and 0.027cm^3 volume. There is no tunneling or undermining noted. There is a large amount of serous drainage noted. Foul odor after cleansing was noted. The wound margin is distinct with the outline attached to the wound base. There is large (67-100%) red granulation within the wound bed. There is no necrotic tissue within the wound bed. The periwound skin appearance exhibited: Excoriation. The periwound skin appearance did not exhibit: Callus, Crepitus, Induration, Rash, Scarring, Dry/Scaly, Maceration, Atrophie Blanche, Cyanosis, Ecchymosis, Hemosiderin Staining, Mottled, Pallor, Rubor, Erythema. Periwound temperature was noted as No Abnormality. Wound #5 status is Open. Original cause of wound was Gradually Appeared. The wound is located on the Left Toe Great. The wound measures 1cm length x 0.9cm width x 0.1cm depth; 0.707cm^2 area and 0.071cm^3 volume. There is Fat Layer (Subcutaneous Tissue) Exposed exposed. There is no tunneling or undermining noted. There is a large amount of serous drainage noted. Foul odor after cleansing was noted. The wound margin is flat and intact. There is large (67-100%) pink granulation within the wound bed. There is no necrotic tissue within the wound bed. The periwound skin appearance exhibited: Excoriation. The periwound skin appearance did not exhibit: Callus, Crepitus, Induration, Rash, Scarring, Dry/Scaly, Maceration, Atrophie Blanche, Cyanosis, Ecchymosis, Hemosiderin Staining, Mottled, Pallor, Rubor, Erythema. Periwound temperature was noted as No Abnormality. Wound #6 status is Open. Original cause of wound was Gradually Appeared. The wound is located on the Left Toe  Second. The wound measures 0.5cm length x 0.5cm width x 0.1cm depth; 0.196cm^2 area and 0.02cm^3 volume. There is Fat Layer (Subcutaneous  Tissue) Exposed exposed. There is no tunneling or undermining noted. There is a large amount of serous drainage noted. Foul odor after cleansing was noted. The wound margin is flat and intact. There is large (67-100%) pink granulation within the wound bed. There is no necrotic tissue within the wound bed. The periwound skin appearance exhibited: Excoriation, Maceration. The periwound skin appearance did not exhibit: Callus, Crepitus, Induration, Rash, Scarring, Dry/Scaly, Atrophie Blanche, Cyanosis, Ecchymosis, Hemosiderin Staining, Mottled, Pallor, Rubor, Erythema. Periwound temperature was noted as No Abnormality. Wound #7 status is Open. Original cause of wound was Gradually Appeared. The wound is located on the Left Toe Third. The wound measures 0.5cm length x 0.4cm width x 0.1cm depth; 0.157cm^2 area and 0.016cm^3 volume. There is Fat Layer (Subcutaneous Tissue) Exposed exposed. There is no tunneling or undermining noted. There is a large amount of serous drainage noted. Foul odor after cleansing was noted. The wound margin is flat and intact. There is large (67-100%) red granulation within the wound bed. There is no necrotic tissue within the wound bed. The periwound skin appearance exhibited: Excoriation. The periwound skin appearance did not exhibit: Callus, Crepitus, Induration, Rash, Scarring, Dry/Scaly, Maceration, Atrophie Blanche, Cyanosis, Ecchymosis, Hemosiderin Staining, Mottled, Pallor, Rubor, Erythema. Periwound temperature was noted as No Abnormality. 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 Wound #2 Right Toe Second: Cleanse wound with mild soap and water May Shower, gently pat wound dry prior to applying new dressing. Wound #4 Right,Dorsal Foot: Cleanse wound with mild soap and water May Shower, gently pat wound dry prior to applying new dressing. Wound #5 Left Toe Great: Cleanse wound with mild soap and water May Shower, gently pat wound dry prior to applying new dressing. Wound #6 Left Toe Second: Cleanse wound with mild soap and water May Shower, gently pat wound dry prior to applying new dressing. Wound #7 Left Toe Third: Cleanse wound with mild soap and water May Shower, gently pat wound dry prior to applying new dressing. Anesthetic: Wound #1 Right Toe Great: Topical Lidocaine 4% cream applied to wound bed prior to debridement Wound #2 Right Toe Second: Topical Lidocaine 4% cream applied to wound bed prior to debridement Wound #4 Right,Dorsal Foot: Topical Lidocaine 4% cream applied to wound bed prior to debridement Wound #5 Left Toe Great: Topical Lidocaine 4% cream applied to wound bed prior to debridement Cedotal, Doctor E. ([id number]) Wound #6 Left Toe Second: Topical Lidocaine 4% cream applied to wound bed prior to debridement Wound #7 Left Toe Third: Topical Lidocaine 4% cream applied to wound bed prior to debridement Skin Barriers/Peri-Wound Care: Wound #1 Right Toe Great: Antifungal cream - on and between toes then AqAg over areas Wound #2 Right Toe Second: Antifungal cream - on and between toes then AqAg over areas Wound  #4 Right,Dorsal Foot: Antifungal cream - on and between toes then AqAg over areas Wound #5 Left Toe Great: Antifungal cream -  on and between toes then AqAg over areas Wound #6 Left Toe Second: Antifungal cream - on and between toes then AqAg over areas Wound #7 Left Toe Third: Antifungal cream - on and between toes then AqAg over areas Primary Wound Dressing: Wound #1 Right Toe Great: Aquacel Ag Wound #2 Right Toe Second: Aquacel Ag Wound #4 Right,Dorsal Foot: Aquacel Ag Wound #5 Left Toe Great: Aquacel Ag Wound #6 Left Toe Second: Aquacel Ag Wound #7 Left Toe Third: Aquacel Ag Secondary Dressing: Wound #1 Right Toe Great: ABD pad Gauze and Kerlix/Conform Wound #2 Right Toe Second: ABD pad Gauze and Kerlix/Conform Wound #4 Right,Dorsal Foot: ABD pad Gauze and Kerlix/Conform Wound #5 Left Toe Great: ABD pad Gauze and Kerlix/Conform Wound #6 Left Toe Second: ABD pad Gauze and Kerlix/Conform Wound #7 Left Toe Third: ABD pad Gauze and Kerlix/Conform Dressing Change Frequency: Wound #1 Right Toe Great: Marini, Aarush E. ([id number]) Change dressing every day. Wound #2 Right Toe Second: Change dressing every day. Wound #4 Right,Dorsal Foot: Change dressing every day. Wound #5 Left Toe Great: Change dressing every day. Wound #6 Left Toe Second: Change dressing every day. Wound #7 Left Toe Third: Change dressing every day. Follow-up Appointments: Wound #1 Right Toe Great: Return Appointment in 1 week. Wound #2 Right Toe Second: Return Appointment in 1 week. Wound #4 Right,Dorsal Foot: Return Appointment in 1 week. Wound #5 Left Toe Great: Return Appointment in 1 week. Wound #6 Left Toe Second: Return Appointment in 1 week. Wound #7 Left Toe Third: Return Appointment in 1 week. Edema Control: Wound #1 Right Toe Great: Patient to wear own compression stockings Elevate legs to the level of the heart and pump ankles as often as possible Wound #2 Right Toe  Second: Patient to wear own compression stockings Elevate legs to the level of the heart and pump ankles as often as possible Wound #4 Right,Dorsal Foot: Patient to wear own compression stockings Elevate legs to the level of the heart and pump ankles as often as possible Wound #5 Left Toe Great: Patient to wear own compression stockings Elevate legs to the level of the heart and pump ankles as often as possible Wound #6 Left Toe Second: Patient to wear own compression stockings Elevate legs to the level of the heart and pump ankles as often as possible Wound #7 Left Toe Third: Patient to wear own compression stockings Elevate legs to the level of the heart and pump ankles as often as possible Additional Orders / Instructions: Wound #1 Right Toe Great: Stop Smoking - Please continue to stay away from alcohol Other: - Please add vitamin A, vitamin C, and Zinc supplements to your diet Wound #2 Right Toe Second: Stop Smoking - Please continue to stay away from alcohol Other: - Please add vitamin A, vitamin C, and Zinc supplements to your diet Wound #4 Right,Dorsal Foot: Shuford, Harrington E. ([id number]) Stop Smoking - Please continue to stay away from alcohol Other: - Please add vitamin A, vitamin C, and Zinc supplements to your diet Wound #5 Left Toe Great: Stop Smoking - Please continue to stay away from alcohol Other: - Please add vitamin A, vitamin C, and Zinc supplements to your diet Wound #6 Left Toe Second: Stop Smoking - Please continue to stay away from alcohol Other: - Please add vitamin A, vitamin C, and Zinc supplements to your diet Wound #7 Left Toe Third: Stop Smoking - Please continue to stay away from alcohol Other: - Please add vitamin A, vitamin C, and  Zinc supplements to your diet Home Health: Wound #1 Right Toe Great: [facility] - per patient request Wound #2 Right Toe Second: D/[facility] - per patient request Wound #4 Right,Dorsal  Foot: [facility] - per patient request Wound #5 Left Toe Great: D/[facility] - per patient request Wound #6 Left Toe Second: [facility] - per patient request Wound #7 Left Toe Third: D/[facility] - per patient request There has been much improvement in his wounds overall and after review today, I have recommended: 1. Silver alginate to be applied over his wounds, and change daily after washing with soap and water. 2. Lotrisone ointment to be applied liberally to his toes and interdigital areas 3. Good control of his diabetes mellitus 4. the need to completely give up smoking discussed with him in great detail 5. regular visits the wound center Electronic Signature(s) Signed: 12/03/2016 2:50:55 PM By: Christin Fudge MD, FACS Entered By: Christin Fudge on 12/03/2016 14:18:27 Veltri, [name] ([id number]) -------------------------------------------------------------------------------- SuperBill Details Patient Name: [name], [name] Date of Service: 12/03/2016 Medical Record Number: [id number] Patient Account Number: [id number] Date of Birth/Sex: Jan 24, 1951 (66 y.o. 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 F17.218 Nicotine dependence, cigarettes, 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 abuse with unspecified alcohol-induced disorder Facility Procedures CPT4 Code:  [id number] Description: 52080 - WOUND CARE VISIT-LEV 5 EST PT Modifier: Quantity: 1 Physician Procedures CPT4: Description Modifier Quantity Code [id number] 24497 - WC PHYS LEVEL 3 - EST PT 1 ICD-10 Description Diagnosis E11.621 Type 2 diabetes mellitus with foot ulcer I87.313 Chronic venous hypertension (idiopathic) with ulcer of bilateral lower extremity  I89.0 Lymphedema, not elsewhere classified L97.522 Non-pressure chronic ulcer of other part of left foot with fat layer exposed Electronic Signature(s) Signed: 12/03/2016 3:10:18 PM By: Montey Hora Signed: 12/03/2016 3:26:40 PM By: Christin Fudge MD, FACS Previous Signature: 12/03/2016 2:50:55 PM Version By: Christin Fudge MD, FACS Entered By: Montey Hora on 12/03/2016 15:10:17

## 2016-12-06 ENCOUNTER — Inpatient Hospital Stay: Payer: Medicare HMO | Attending: Oncology | Admitting: Oncology

## 2016-12-06 ENCOUNTER — Inpatient Hospital Stay: Payer: Medicare HMO

## 2016-12-06 ENCOUNTER — Encounter: Payer: Self-pay | Admitting: Oncology

## 2016-12-06 VITALS — BP 169/78 | HR 68 | Temp 96.8°F | Resp 18 | Wt 166.4 lb

## 2016-12-06 DIAGNOSIS — Z79899 Other long term (current) drug therapy: Secondary | ICD-10-CM | POA: Insufficient documentation

## 2016-12-06 DIAGNOSIS — D472 Monoclonal gammopathy: Secondary | ICD-10-CM

## 2016-12-06 DIAGNOSIS — E1122 Type 2 diabetes mellitus with diabetic chronic kidney disease: Secondary | ICD-10-CM | POA: Diagnosis not present

## 2016-12-06 DIAGNOSIS — R188 Other ascites: Secondary | ICD-10-CM | POA: Insufficient documentation

## 2016-12-06 DIAGNOSIS — R599 Enlarged lymph nodes, unspecified: Secondary | ICD-10-CM

## 2016-12-06 DIAGNOSIS — K219 Gastro-esophageal reflux disease without esophagitis: Secondary | ICD-10-CM | POA: Diagnosis not present

## 2016-12-06 DIAGNOSIS — R591 Generalized enlarged lymph nodes: Secondary | ICD-10-CM

## 2016-12-06 DIAGNOSIS — N189 Chronic kidney disease, unspecified: Secondary | ICD-10-CM | POA: Insufficient documentation

## 2016-12-06 DIAGNOSIS — I509 Heart failure, unspecified: Secondary | ICD-10-CM | POA: Diagnosis not present

## 2016-12-06 DIAGNOSIS — Z85820 Personal history of malignant melanoma of skin: Secondary | ICD-10-CM | POA: Insufficient documentation

## 2016-12-06 DIAGNOSIS — D649 Anemia, unspecified: Secondary | ICD-10-CM

## 2016-12-06 DIAGNOSIS — R590 Localized enlarged lymph nodes: Secondary | ICD-10-CM | POA: Diagnosis not present

## 2016-12-06 DIAGNOSIS — Z7982 Long term (current) use of aspirin: Secondary | ICD-10-CM | POA: Diagnosis not present

## 2016-12-06 DIAGNOSIS — D509 Iron deficiency anemia, unspecified: Secondary | ICD-10-CM

## 2016-12-06 DIAGNOSIS — F17210 Nicotine dependence, cigarettes, uncomplicated: Secondary | ICD-10-CM | POA: Insufficient documentation

## 2016-12-06 DIAGNOSIS — I130 Hypertensive heart and chronic kidney disease with heart failure and stage 1 through stage 4 chronic kidney disease, or unspecified chronic kidney disease: Secondary | ICD-10-CM | POA: Insufficient documentation

## 2016-12-06 LAB — IRON AND TIBC
IRON: 220 ug/dL — AB (ref 45–182)
Saturation Ratios: 65 % — ABNORMAL HIGH (ref 17.9–39.5)
TIBC: 337 ug/dL (ref 250–450)
UIBC: 117 ug/dL

## 2016-12-06 LAB — VITAMIN B12: Vitamin B-12: 420 pg/mL (ref 180–914)

## 2016-12-06 LAB — CBC WITH DIFFERENTIAL/PLATELET
BASOS PCT: 1 %
Basophils Absolute: 0 10*3/uL (ref 0–0.1)
EOS ABS: 0.3 10*3/uL (ref 0–0.7)
Eosinophils Relative: 4 %
HCT: 20.6 % — ABNORMAL LOW (ref 40.0–52.0)
HEMOGLOBIN: 7.1 g/dL — AB (ref 13.0–18.0)
Lymphocytes Relative: 17 %
Lymphs Abs: 1 10*3/uL (ref 1.0–3.6)
MCH: 30.6 pg (ref 26.0–34.0)
MCHC: 34.4 g/dL (ref 32.0–36.0)
MCV: 88.8 fL (ref 80.0–100.0)
MONOS PCT: 13 %
Monocytes Absolute: 0.7 10*3/uL (ref 0.2–1.0)
NEUTROS ABS: 3.9 10*3/uL (ref 1.4–6.5)
NEUTROS PCT: 65 %
Platelets: 235 10*3/uL (ref 150–440)
RBC: 2.32 MIL/uL — AB (ref 4.40–5.90)
RDW: 13.8 % (ref 11.5–14.5)
WBC: 5.9 10*3/uL (ref 3.8–10.6)

## 2016-12-06 LAB — FERRITIN: FERRITIN: 56 ng/mL (ref 24–336)

## 2016-12-06 LAB — PREPARE RBC (CROSSMATCH)

## 2016-12-06 MED ORDER — ACETAMINOPHEN 325 MG PO TABS
650.0000 mg | ORAL_TABLET | Freq: Once | ORAL | Status: AC
  Administered 2016-12-06: 650 mg via ORAL

## 2016-12-06 MED ORDER — SODIUM CHLORIDE 0.9 % IV SOLN
250.0000 mL | Freq: Once | INTRAVENOUS | Status: AC
  Administered 2016-12-06: 250 mL via INTRAVENOUS
  Filled 2016-12-06: qty 250

## 2016-12-06 MED ORDER — ACETAMINOPHEN 325 MG PO TABS
ORAL_TABLET | ORAL | Status: AC
  Filled 2016-12-06: qty 2

## 2016-12-06 NOTE — Progress Notes (Signed)
Here for follow up

## 2016-12-06 NOTE — Progress Notes (Signed)
Hematology/Oncology Consult note [facility]  [phone] [phone]  Patient Care Team: McLean-Scocuzza, Nino Glow, MD as PCP - General (Internal Medicine)   Name of the patient: [name]  [id number]  1950-09-02   Date of visit: 12/06/16  Diagnosis- 1. Intraabdominal adenopathy 2. 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. 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 Baruch Gouty. Patient had CT abdomen in July 2016 which showed retroperitoneal, celiac and gastric adenopathy. MR pelvis in Oct 2017 showed b/l inguinal and external iliac adenopathy as well. Inguinal LN biopsy was negative for malignancy.   2. Biopsy of the skin lesion showed: DIAGNOSIS:  A. PERIANAL POLYP, LEFT; EXCISION:  - SKIN WITH POLYPOID VASCULAR PROLIFERATION WITH SUPERFICIAL NECROSIS,  SEE COMMENT.   B. PERIANAL MASS, RIGHT; INCISIONAL BIOPSY:  - INVASIVE CARCINOMA WITH BASALOID FEATURES.  - SEE COMMENT.   Comment #1:  The differential diagnosis for the perianal polyp includes pyogenic  granuloma and ulcerated hemangioma.   Comment #2:  Immunohistochemical stains were performed. The carcinoma is positive for  p16, BerEp4, and BCL-2. Stain controls worked appropriately.  Unfortunately the pattern of staining does not assist with further  classification of the carcinoma. Clinical correlation is required to  determine if the lesion involves perianal skin primarily (BCC) or is  arising in the anal canal (squamous cell carcinoma with basaloid  features).   This was followed by RT to the lesion.  3. He had repeat CT abdomen in feb 2018 which showed: IMPRESSION: Progressive hepatic cirrhosis since prior study. No evidence of hepatic neoplasm.  New  moderate ascites, diffuse mesenteric and body wall edema, and small bilateral pleural effusions. Mild diffuse small bowel wall thickening, consistent with hypoalbuminemia.  Findings consistent with portal venous hypertension, including mild splenomegaly and upper abdominal venous collaterals.  Stable mild abdominal lymphadenopathy. Mild increase in bilateral iliac and inguinal lymphadenopathy in the pelvis, which may be reactive in etiology, with metastatic disease considered less Likely.  4. Ascites recently drained. He is get egd and colonoscopy for his microcytic anemia  5. CBC on 09/18/2016 showed H&H of 6.5/19.8 and MCV of 86.7. Labs suggestive of iron deficiency anemia. B12 and folate was within normal limits. Multiple myeloma panel showed IGG monoclonal protein of 0.9 gm. Polyclonal gammopathy Haptoglobin and no copper was normal. Reticulocyte count was mildly elevated at 7.9. Patient received 2 doses of ferriheme on 620 and 10/03/2016. He also received 1 unit of blood transfusion  6. EGD showed a normal duodenum and normal esophagus. Found to have gastritis which was biopsied. Also found to have a large polypoid mass in the left piriform sinus and nonobstructing the airway. This was subsequently evaluated by ENT which did not find any evidence of mass. The colonoscopy showed appendix that was appearing protruding and ordered this blood with stool. No mucus seen in the vicinity. He was evaluated by a [facility] and has been considered for a repeat colonoscopy versus endoscopy mucosal resection of the mucocele   Interval history- leg swelling is better. Fees fatigued. Was diagnosed with Hepatitis C and will be starting treatment soon for the same  ECOG PS- 2  Pain scale- 0   Review of systems- Review of Systems  Constitutional: Positive for malaise/fatigue.  Negative for chills, fever and weight loss.  HENT: Negative for congestion, ear discharge and nosebleeds.   Eyes:  Negative for blurred vision.  Respiratory: Negative for cough, hemoptysis, sputum production, shortness of breath and wheezing.   Cardiovascular: Positive for leg swelling. Negative for chest pain, palpitations, orthopnea and claudication.  Gastrointestinal: Negative for abdominal pain, blood in stool, constipation, diarrhea, heartburn, melena, nausea and vomiting.  Genitourinary: Negative for dysuria, flank pain, frequency, hematuria and urgency.  Musculoskeletal: Negative for back pain, joint pain and myalgias.  Skin: Negative for rash.  Neurological: Negative for dizziness, tingling, focal weakness, seizures, weakness and headaches.  Endo/Heme/Allergies: Does not bruise/bleed easily.  Psychiatric/Behavioral: Negative for depression and suicidal ideas. The patient does not have insomnia.       No Known Allergies   Past Medical History:  Diagnosis Date  . CHF (congestive heart failure) ([place])   . Diabetes mellitus    Patient take Metformin.  Marland Kitchen GERD (gastroesophageal reflux disease)   . Hypertension   . Leg swelling   . Loose, teeth    PATIENT STATES "HAS 6 TEETH, SOME ARE LOOSE"  . Neuromuscular disorder ([place])    RIGHT HAND AND FINGERS NUMB  . Shortness of breath dyspnea   . 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 02/24/2016     Past Surgical History:  Procedure Laterality Date  . APPENDECTOMY    . COLONOSCOPY WITH PROPOFOL N/A 11/19/2014   Procedure: COLONOSCOPY WITH PROPOFOL;  Surgeon: Lucilla Lame, MD;  Location: [facility];  Service: Endoscopy;  Laterality: N/A;  DIABETIC-ORAL MEDS  . COLONOSCOPY WITH PROPOFOL N/A 10/04/2016   Procedure: COLONOSCOPY WITH PROPOFOL;  Surgeon: Jonathon Bellows, MD;  Location: [facility] ENDOSCOPY;  Service: Endoscopy;  Laterality: N/A;  . ESOPHAGOGASTRODUODENOSCOPY (EGD) WITH PROPOFOL N/A 10/04/2016   Procedure: ESOPHAGOGASTRODUODENOSCOPY (EGD) WITH PROPOFOL;  Surgeon: Jonathon Bellows, MD;  Location: [facility] ENDOSCOPY;  Service: Endoscopy;  Laterality:  N/A;  . GIVENS CAPSULE STUDY N/A 11/13/2016   Procedure: GIVENS CAPSULE STUDY;  Surgeon: Jonathon Bellows, MD;  Location: [facility] ENDOSCOPY;  Service: Gastroenterology;  Laterality: N/A;  . INGUINAL LYMPH NODE BIOPSY Right 01/26/2016   Procedure: INGUINAL LYMPH NODE BIOPSY;  Surgeon: Clayburn Pert, MD;  Location: [facility] ORS;  Service: General;  Laterality: Right;  . RECTAL BIOPSY N/A 11/02/2014   Procedure: BIOPSY RECTAL;  Surgeon: Marlyce Huge, MD;  Location: [facility] ORS;  Service: General;  Laterality: N/A;  . RECTAL EXAM UNDER ANESTHESIA N/A 11/02/2014   Procedure: RECTAL EXAM UNDER ANESTHESIA;  Surgeon: Marlyce Huge, MD;  Location: [facility] ORS;  Service: General;  Laterality: N/A;  . TONSILLECTOMY      Social History   Social History  . Marital status: Divorced    Spouse name: N/A  . Number of children: N/A  . Years of education: N/A   Occupational History  . Not on file.   Social History Main Topics  . Smoking status: Current Every Day Smoker    Packs/day: 0.25    Years: 20.00    Types: Cigarettes  . Smokeless tobacco: Never Used     Comment: pt claims no smoking x 3 d  . Alcohol use No     Comment: per pt used to drink beer daily- quit may/2018  . Drug use: Yes    Types: Marijuana     Comment: Last Use 2016 per patient  . Sexual activity: No   Other Topics Concern  . Not on file   Social History Narrative  . No narrative on  file    Family History  Problem Relation Age of Onset  . Asthma Mother      Current Outpatient Prescriptions:  .  amLODipine (NORVASC) 10 MG tablet, Take by mouth., Disp: , Rfl:  .  aspirin EC 81 MG tablet, Take 81 mg by mouth daily., Disp: , Rfl:  .  atorvastatin (LIPITOR) 40 MG tablet, Take 40 mg by mouth at bedtime., Disp: , Rfl:  .  chlorthalidone (HYGROTON) 25 MG tablet, Take by mouth., Disp: , Rfl:  .  doxycycline (VIBRA-TABS) 100 MG tablet, Take by mouth., Disp: , Rfl:  .  ferrous sulfate 325 (65 FE) MG tablet, Take by mouth.,  Disp: , Rfl:  .  HYDROcodone-acetaminophen (NORCO) 5-325 MG tablet, Take 1 tablet by mouth every 6 (six) hours as needed for moderate pain., Disp: 30 tablet, Rfl: 0 .  hydrOXYzine (ATARAX/VISTARIL) 25 MG tablet, Take 25 mg by mouth 3 times daily as needed., Disp: , Rfl:  .  isosorbide mononitrate (IMDUR) 30 MG 24 hr tablet, Take 30 mg by mouth daily. , Disp: , Rfl:  .  lisinopril (PRINIVIL,ZESTRIL) 40 MG tablet, Take 40 mg by mouth once daily., Disp: , Rfl:  .  omeprazole (PRILOSEC) 40 MG capsule, Take by mouth., Disp: , Rfl:  .  polyethylene glycol (MIRALAX / GLYCOLAX) packet, Take by mouth., Disp: , Rfl:  .  silver sulfADIAZINE (SILVADENE) 1 % cream, Apply 1 application topically 2 (two) times daily., Disp: 50 g, Rfl: 2 .  spironolactone (ALDACTONE) 25 MG tablet, Take 25 mg by mouth daily. , Disp: , Rfl:  .  torsemide (DEMADEX) 20 MG tablet, Take 20 mg by mouth daily. , Disp: , Rfl:   Current Facility-Administered Medications:  .  albumin human 25 % solution 50 g, 50 g, Intravenous, Once, Jonathon Bellows, MD .  albumin human 25 % solution 50 g, 50 g, Intravenous, Once, Jonathon Bellows, MD  Physical exam:  Vitals:   12/06/16 1121  BP: (!) 169/78  Pulse: 68  Resp: 18  Temp: (!) 96.8 F (36 C)  TempSrc: Tympanic  Weight: 166 lb 6.4 oz (75.5 kg)   Physical Exam  Constitutional: He is oriented to person, place, and time and well-developed, well-nourished, and in no distress.  HENT:  Head: Normocephalic and atraumatic.  Eyes: Pupils are equal, round, and reactive to light. EOM are normal.  Neck: Normal range of motion.  Cardiovascular: Normal rate, regular rhythm and normal heart sounds.   Pulmonary/Chest: Effort normal and breath sounds normal.  Abdominal: Soft. Bowel sounds are normal. He exhibits no distension. There is no tenderness.  Musculoskeletal: He exhibits edema.  Neurological: He is alert and oriented to person, place, and time.  Skin: Skin is warm and dry.     CMP Latest Ref  Rng & Units 09/18/2016  Glucose 65 - 99 mg/dL 109(H)  BUN 6 - 20 mg/dL 21(H)  Creatinine 0.61 - 1.24 mg/dL 1.63(H)  Sodium 135 - 145 mmol/L 129(L)  Potassium 3.5 - 5.1 mmol/L 4.3  Chloride 101 - 111 mmol/L 98(L)  CO2 22 - 32 mmol/L 25  Calcium 8.9 - 10.3 mg/dL 8.3(L)  Total Protein 6.5 - 8.1 g/dL 7.4  Total Bilirubin 0.3 - 1.2 mg/dL 0.4  Alkaline Phos 38 - 126 U/L 65  AST 15 - 41 U/L 38  ALT 17 - 63 U/L 19   CBC Latest Ref Rng & Units 12/06/2016  WBC 3.8 - 10.6 K/uL 5.9  Hemoglobin 13.0 - 18.0 g/dL 7.1(L)  Hematocrit 40.0 - 52.0 % 20.6(L)  Platelets 150 - 440 K/uL 235    No images are attached to the encounter.  US Renal  Result Date: 11/14/2016 CLINICAL DATA:  Chronic renal disease. EXAM: RENAL / URINARY TRACT ULTRASOUND COMPLETE COMPARISON:  CT 05/30/2016 FINDINGS: Right Kidney: Length: 12.0 cm. increased renal echogenicity with normal cortical thickness. Left Kidney: Length: 12.4 cm. Increased renal echogenicity with normal cortical thickness. Bladder: Thickened and irregular bladder wall. IMPRESSION: 1.  No hydronephrosis. 2. Increased renal echogenicity, suggesting medical renal disease. 3. Bladder wall thickening may represent cystitis or a component of outlet obstruction. Electronically Signed   By: Abigail Miyamoto M.D.   On: 11/14/2016 17:12     Assessment and plan- Patient is a 66 y.o. male who sees me for following issues:  1. Iron deficiency anemia- awaiting capsule study. Iron studies from today pending. He has receievd 2 doses of feraheme in June but no improvement in hb. His hct is <21 and I will transfuse 1 unit of PRBC today. If iron studies from today do not reveal iron deficiency, I will proceed with bone marrow biopsy to ascertain the cause of his anemia. Discussed risks and benefits of bone marrow biopsy including all but not limited to risk of bleeding. Patient understands and agrees to proceed as planned  2. Intra abdominal adenopathy- Scans were reviewed at tumor  board and overall his adenopathy seems stable as compared to scans in 2016. Repeat ct abdomen in 2 months ordered  3.  MGUS- I do not think this is contributing to his anemia. Myeloma panel, free light chain and random UPEP pending from today  rtc in 3-4 weeks to discuss bone marrow biopsy results    Visit Diagnosis 1. Lymphadenopathy, inguinal   2. Normocytic anemia   3. MGUS (monoclonal gammopathy of unknown significance)   4. Iron deficiency anemia, unspecified iron deficiency anemia type      Dr. Randa Evens, MD, MPH [place] [facility] Pager- [phone] 12/06/2016 10:59 AM

## 2016-12-07 ENCOUNTER — Other Ambulatory Visit: Payer: Self-pay | Admitting: General Surgery

## 2016-12-07 LAB — MULTIPLE MYELOMA PANEL, SERUM
ALBUMIN SERPL ELPH-MCNC: 3 g/dL (ref 2.9–4.4)
ALBUMIN/GLOB SERPL: 0.7 (ref 0.7–1.7)
Alpha 1: 0.2 g/dL (ref 0.0–0.4)
Alpha2 Glob SerPl Elph-Mcnc: 0.7 g/dL (ref 0.4–1.0)
B-GLOBULIN SERPL ELPH-MCNC: 0.9 g/dL (ref 0.7–1.3)
GAMMA GLOB SERPL ELPH-MCNC: 2.5 g/dL — AB (ref 0.4–1.8)
GLOBULIN, TOTAL: 4.4 g/dL — AB (ref 2.2–3.9)
IGA: 273 mg/dL (ref 61–437)
IgG (Immunoglobin G), Serum: 2720 mg/dL — ABNORMAL HIGH (ref 700–1600)
IgM (Immunoglobulin M), Srm: 97 mg/dL (ref 20–172)
M PROTEIN SERPL ELPH-MCNC: 1 g/dL — AB
Total Protein ELP: 7.4 g/dL (ref 6.0–8.5)

## 2016-12-07 LAB — TYPE AND SCREEN
ABO/RH(D): A POS
Antibody Screen: NEGATIVE
UNIT DIVISION: 0

## 2016-12-07 LAB — KAPPA/LAMBDA LIGHT CHAINS
KAPPA FREE LGHT CHN: 133.5 mg/L — AB (ref 3.3–19.4)
KAPPA, LAMDA LIGHT CHAIN RATIO: 1.62 (ref 0.26–1.65)
LAMDA FREE LIGHT CHAINS: 82.2 mg/L — AB (ref 5.7–26.3)

## 2016-12-07 LAB — BPAM RBC
BLOOD PRODUCT EXPIRATION DATE: 201809152359
ISSUE DATE / TIME: 201808301330
Unit Type and Rh: 6200

## 2016-12-11 ENCOUNTER — Other Ambulatory Visit (HOSPITAL_COMMUNITY)
Admission: RE | Admit: 2016-12-11 | Discharge: 2016-12-11 | Disposition: A | Discharge status: Home / Self Care | Payer: Medicare HMO | Source: Ambulatory Visit | Attending: Oncology | Admitting: Oncology

## 2016-12-11 ENCOUNTER — Ambulatory Visit
Admission: RE | Admit: 2016-12-11 | Discharge: 2016-12-11 | Disposition: A | Discharge status: Home / Self Care | Payer: Medicare HMO | Source: Ambulatory Visit | Attending: Oncology | Admitting: Oncology

## 2016-12-11 DIAGNOSIS — I509 Heart failure, unspecified: Secondary | ICD-10-CM | POA: Diagnosis not present

## 2016-12-11 DIAGNOSIS — Z7982 Long term (current) use of aspirin: Secondary | ICD-10-CM | POA: Insufficient documentation

## 2016-12-11 DIAGNOSIS — E1122 Type 2 diabetes mellitus with diabetic chronic kidney disease: Secondary | ICD-10-CM | POA: Diagnosis not present

## 2016-12-11 DIAGNOSIS — D472 Monoclonal gammopathy: Secondary | ICD-10-CM | POA: Diagnosis not present

## 2016-12-11 DIAGNOSIS — I130 Hypertensive heart and chronic kidney disease with heart failure and stage 1 through stage 4 chronic kidney disease, or unspecified chronic kidney disease: Secondary | ICD-10-CM | POA: Insufficient documentation

## 2016-12-11 DIAGNOSIS — R531 Weakness: Secondary | ICD-10-CM | POA: Insufficient documentation

## 2016-12-11 DIAGNOSIS — D649 Anemia, unspecified: Secondary | ICD-10-CM

## 2016-12-11 DIAGNOSIS — K7460 Unspecified cirrhosis of liver: Secondary | ICD-10-CM | POA: Insufficient documentation

## 2016-12-11 DIAGNOSIS — F17210 Nicotine dependence, cigarettes, uncomplicated: Secondary | ICD-10-CM | POA: Insufficient documentation

## 2016-12-11 DIAGNOSIS — Z79899 Other long term (current) drug therapy: Secondary | ICD-10-CM | POA: Diagnosis not present

## 2016-12-11 DIAGNOSIS — D72822 Plasmacytosis: Secondary | ICD-10-CM | POA: Diagnosis not present

## 2016-12-11 DIAGNOSIS — K219 Gastro-esophageal reflux disease without esophagitis: Secondary | ICD-10-CM | POA: Insufficient documentation

## 2016-12-11 HISTORY — DX: Anemia, unspecified: D64.9

## 2016-12-11 LAB — CBC WITH DIFFERENTIAL/PLATELET
BASOS ABS: 0 10*3/uL (ref 0–0.1)
BASOS PCT: 1 %
Eosinophils Absolute: 0.2 10*3/uL (ref 0–0.7)
Eosinophils Relative: 4 %
HEMATOCRIT: 23.9 % — AB (ref 40.0–52.0)
HEMOGLOBIN: 8 g/dL — AB (ref 13.0–18.0)
LYMPHS PCT: 14 %
Lymphs Abs: 0.9 10*3/uL — ABNORMAL LOW (ref 1.0–3.6)
MCH: 30 pg (ref 26.0–34.0)
MCHC: 33.6 g/dL (ref 32.0–36.0)
MCV: 89.5 fL (ref 80.0–100.0)
Monocytes Absolute: 0.7 10*3/uL (ref 0.2–1.0)
Monocytes Relative: 12 %
NEUTROS ABS: 4.2 10*3/uL (ref 1.4–6.5)
NEUTROS PCT: 69 %
Platelets: 202 10*3/uL (ref 150–440)
RBC: 2.67 MIL/uL — AB (ref 4.40–5.90)
RDW: 13.5 % (ref 11.5–14.5)
WBC: 6.1 10*3/uL (ref 3.8–10.6)

## 2016-12-11 LAB — PROTIME-INR
INR: 0.99
PROTHROMBIN TIME: 13 s (ref 11.4–15.2)

## 2016-12-11 LAB — IFE AND PE, RANDOM URINE
% BETA, Urine: 10 %
ALPHA 1 URINE: 2 %
Albumin, U: 64.6 %
Alpha 2, Urine: 4.1 %
GAMMA GLOBULIN URINE: 19.3 %
M-SPIKE %, URINE: 6.7 % — AB
Total Protein, Urine: 327.2 mg/dL

## 2016-12-11 LAB — GLUCOSE, CAPILLARY: Glucose-Capillary: 105 mg/dL — ABNORMAL HIGH (ref 65–99)

## 2016-12-11 MED ORDER — MIDAZOLAM HCL 2 MG/2ML IJ SOLN
INTRAMUSCULAR | Status: AC | PRN
  Administered 2016-12-11 (×2): 1 mg via INTRAVENOUS

## 2016-12-11 MED ORDER — HYDROCODONE-ACETAMINOPHEN 5-325 MG PO TABS: 1.0000 | ORAL_TABLET | ORAL | Status: DC | PRN

## 2016-12-11 MED ORDER — HEPARIN SOD (PORK) LOCK FLUSH 100 UNIT/ML IV SOLN
INTRAVENOUS | Status: AC
  Filled 2016-12-11: qty 5

## 2016-12-11 MED ORDER — FENTANYL CITRATE (PF) 100 MCG/2ML IJ SOLN
INTRAMUSCULAR | Status: AC | PRN
  Administered 2016-12-11 (×2): 50 ug via INTRAVENOUS

## 2016-12-11 MED ORDER — SODIUM CHLORIDE 0.9 % IV SOLN
INTRAVENOUS | Status: DC
  Administered 2016-12-11: 09:00:00 via INTRAVENOUS

## 2016-12-11 MED ORDER — MIDAZOLAM HCL 2 MG/2ML IJ SOLN
INTRAMUSCULAR | Status: AC
  Filled 2016-12-11: qty 4

## 2016-12-11 MED ORDER — FENTANYL CITRATE (PF) 100 MCG/2ML IJ SOLN
INTRAMUSCULAR | Status: AC
  Filled 2016-12-11: qty 4

## 2016-12-11 NOTE — Procedures (Signed)
CT guided bone marrow biopsy.  2 aspirates and 2 cores obtained.  Minimal blood loss and no immediate complication.   Bedrest for 1 hour.

## 2016-12-11 NOTE — Discharge Instructions (Signed)
Moderate Conscious Sedation, Adult, Care After These instructions provide you with information about caring for yourself after your procedure. Your health care provider may also give you more specific instructions. Your treatment has been planned according to current medical practices, but problems sometimes occur. Call your health care provider if you have any problems or questions after your procedure. What can I expect after the procedure? After your procedure, it is common: To feel sleepy for several hours. To feel clumsy and have poor balance for several hours. To have poor judgment for several hours. To vomit if you eat too soon.  Follow these instructions at home: For at least 24 hours after the procedure:  Do not: Participate in activities where you could fall or become injured. Drive. Use heavy machinery. Drink alcohol. Take sleeping pills or medicines that cause drowsiness. Make important decisions or sign legal documents. Take care of children on your own. Rest. Eating and drinking Follow the diet recommended by your health care provider. If you vomit: Drink water, juice, or soup when you can drink without vomiting. Make sure you have little or no nausea before eating solid foods. General instructions Have a responsible adult stay with you until you are awake and alert. Take over-the-counter and prescription medicines only as told by your health care provider. If you smoke, do not smoke without supervision. Keep all follow-up visits as told by your health care provider. This is important. Contact a health care provider if: You keep feeling nauseous or you keep vomiting. You feel light-headed. You develop a rash. You have a fever. Get help right away if: You have trouble breathing. This information is not intended to replace advice given to you by your health care provider. Make sure you discuss any questions you have with your health care provider. Document Released:  01/14/2013 Document Revised: 08/29/2015 Document Reviewed: 07/16/2015 Elsevier Interactive Patient Education  2018 [employer]. Needle Biopsy of the Bone A bone biopsy is a procedure in which a small sample of bone is removed. The sample is taken with a needle. Then, the bone sample is looked at under a microscope to check for abnormalities. The sample is usually taken from a bone that is close to the skin. This procedure may be done to check for various problems with the bone. You may need this procedure if imaging tests or blood tests have indicated a possible problem. This procedure may be done to help determine if a bone tumor is cancerous (malignant). A bone biopsy can help to diagnose problems such as:  Tumors of the bone (sarcomas) and bone marrow (multiple myeloma).  Bone that forms abnormally (Paget disease).  Noncancerous (benign) bone cysts.  Bony growths.  Infections in the bone.  Tell a health care provider about:  Any allergies you have.  All medicines you are taking, including vitamins, herbs, eye drops, creams, and over-the-counter medicines.  Any problems you or family members have had with anesthetic medicines.  Any blood disorders you have.  Any surgeries you have had.  Any medical conditions you have. What are the risks? Generally, this is a safe procedure. However, problems may occur, including:  Excessive bleeding.  Infection.  Injury to surrounding tissue.  What happens before the procedure?  Ask your health care provider about: ? Changing or stopping your regular medicines. This is especially important if you are taking diabetes medicines or blood thinners. ? Taking medicines such as aspirin and ibuprofen. These medicines can thin your blood. Do not take these  medicines before your procedure if your health care provider instructs you not to.  Follow instructions from your health care provider about eating or drinking restrictions.  Plan to have  someone take you home after the procedure.  If you go home right after the procedure, plan to have someone with you for 24 hours. What happens during the procedure?  An IV tube may be inserted into one of your veins.  The injection site will be cleaned with a germ-killing solution (antiseptic).  You will be given one or more of the following: ? A medicine to help you relax (sedative). ? A medicine to numb the area (local anesthetic).  The sample of bone will be removed by putting a large needle through the skin and into the bone.  The needle will be removed.  A bandage (dressing) will be placed over the insertion site and taped in place. The procedure may vary among health care providers and hospitals. What happens after the procedure?  Your blood pressure, heart rate, breathing rate, and blood oxygen level will be monitored often until the medicines you were given have worn off.  Return to your normal activities as told by your health care provider. This information is not intended to replace advice given to you by your health care provider. Make sure you discuss any questions you have with your health care provider. Document Released: 02/02/2004 Document Revised: 09/01/2015 Document Reviewed: 05/03/2014 Elsevier Interactive Patient Education  [employer].

## 2016-12-11 NOTE — Consult Note (Signed)
Chief Complaint: Patient was seen in consultation today for a bone marrow biopsy at the request of Rao,Archana C  Referring Physician(s): Rao,Archana C   Patient Status: [facility] - Out-pt  History of Present Illness: [name] is a 66 y.o. male with normocytic anemia.  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  SCC was treated with radiation and patient has stable abdominal lymphadenopathy by imaging.  Patient is also being treated for sores on his toes. Patient has no new complaints today.  Only complaint is occasional weakness when he is getting up.  He denies dizziness or falling.   Past Medical History:  Diagnosis Date  . Anemia   . CHF (congestive heart failure) ([place])   . Diabetes mellitus    Patient take Metformin.  Marland Kitchen GERD (gastroesophageal reflux disease)   . Hypertension   . Leg swelling   . Loose, teeth    PATIENT STATES "HAS 6 TEETH, SOME ARE LOOSE"  . Neuromuscular disorder ([place])    RIGHT HAND AND FINGERS NUMB  . Shortness of breath dyspnea   . 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 APPENDECTOMY    . COLONOSCOPY WITH PROPOFOL N/A 11/19/2014   Procedure: COLONOSCOPY WITH PROPOFOL;  Surgeon: Lucilla Lame, MD;  Location: [place];  Service: Endoscopy;  Laterality: N/A;  DIABETIC-ORAL MEDS  . COLONOSCOPY WITH PROPOFOL N/A 10/04/2016   Procedure: COLONOSCOPY WITH PROPOFOL;  Surgeon: Jonathon Bellows, MD;  Location: [facility] ENDOSCOPY;  Service: Endoscopy;  Laterality: N/A;  . ESOPHAGOGASTRODUODENOSCOPY (EGD) WITH PROPOFOL N/A 10/04/2016   Procedure: ESOPHAGOGASTRODUODENOSCOPY (EGD) WITH PROPOFOL;  Surgeon: Jonathon Bellows, MD;  Location: [facility] ENDOSCOPY;  Service: Endoscopy;  Laterality: N/A;  . GIVENS CAPSULE STUDY N/A 11/13/2016   Procedure: GIVENS CAPSULE STUDY;  Surgeon: Jonathon Bellows, MD;  Location: [facility] ENDOSCOPY;  Service: Gastroenterology;  Laterality: N/A;  . INGUINAL  LYMPH NODE BIOPSY Right 01/26/2016   Procedure: INGUINAL LYMPH NODE BIOPSY;  Surgeon: Clayburn Pert, MD;  Location: [facility] ORS;  Service: General;  Laterality: Right;  . RECTAL BIOPSY N/A 11/02/2014   Procedure: BIOPSY RECTAL;  Surgeon: Marlyce Huge, MD;  Location: [facility] ORS;  Service: General;  Laterality: N/A;  . RECTAL EXAM UNDER ANESTHESIA N/A 11/02/2014   Procedure: RECTAL EXAM UNDER ANESTHESIA;  Surgeon: Marlyce Huge, MD;  Location: [facility] ORS;  Service: General;  Laterality: N/A;  . TONSILLECTOMY      Allergies: Patient has no known allergies.  Medications: Prior to Admission medications   Medication Sig Start Date End Date Taking? Authorizing Provider  amLODipine (NORVASC) 10 MG tablet Take by mouth. 01/26/16  Yes [provider]  aspirin EC 81 MG tablet Take 81 mg by mouth daily.   Yes [provider]  chlorthalidone (HYGROTON) 25 MG tablet Take by mouth.   Yes [provider]  ferrous sulfate 325 (65 FE) MG tablet Take by mouth.   Yes [provider]  isosorbide mononitrate (IMDUR) 30 MG 24 hr tablet Take 30 mg by mouth daily.  08/13/16 08/13/17 Yes [provider]  lisinopril (PRINIVIL,ZESTRIL) 40 MG tablet Take 40 mg by mouth once daily. 02/24/16  Yes [provider]  polyethylene glycol (MIRALAX / GLYCOLAX) packet Take by mouth.   Yes [provider]  silver sulfADIAZINE (SILVADENE) 1 % cream Apply 1 application topically 2 (two) times daily. 03/29/16  Yes Chrystal, Eulas Post, MD  spironolactone (ALDACTONE) 25 MG tablet  Take 25 mg by mouth daily.    Yes [provider]  atorvastatin (LIPITOR) 40 MG tablet Take 40 mg by mouth at bedtime.    [provider]  doxycycline (VIBRA-TABS) 100 MG tablet Take by mouth.    [provider]  HYDROcodone-acetaminophen (NORCO) 5-325 MG tablet Take 1 tablet by mouth every 6 (six) hours as needed for moderate pain. Patient not taking: Reported on  12/06/2016 03/29/16   Noreene Filbert, MD  hydrOXYzine (ATARAX/VISTARIL) 25 MG tablet Take 25 mg by mouth 3 times daily as needed. 02/24/16   [provider]  omeprazole (PRILOSEC) 40 MG capsule Take by mouth.    [provider]  torsemide (DEMADEX) 20 MG tablet Take 20 mg by mouth daily.     [provider]     Family History  Problem Relation Age of Onset  . Asthma Mother     Social History   Social History  . Marital status: Divorced    Spouse name: N/A  . Number of children: N/A  . Years of education: N/A   Social History Main Topics  . Smoking status: Current Every Day Smoker    Packs/day: 0.50    Years: 20.00    Types: Cigarettes  . Smokeless tobacco: Never Used     Comment: pt claims no smoking x 3 d  . Alcohol use No     Comment: per pt used to drink beer daily- quit may/2018  . Drug use: Yes    Types: Marijuana     Comment: Last Use 2016 per patient  . Sexual activity: No   Other Topics Concern  . None   Social History Narrative  . None    Review of Systems: A 12 point ROS discussed and pertinent positives are indicated in the HPI above.  All other systems are negative.  Review of Systems  Respiratory: Negative.   Cardiovascular: Negative.   Gastrointestinal: Negative.   Genitourinary: Negative.   Neurological: Positive for weakness.    Vital Signs: BP 118/78   Pulse 84   Temp 98.2 F (36.8 C) (Oral)   Resp 20   SpO2 98%   Physical Exam  Constitutional: No distress.  Cardiovascular: Normal rate and regular rhythm.   Murmur heard. Pulmonary/Chest: Effort normal and breath sounds normal.  Abdominal: Soft. Bowel sounds are normal. He exhibits no distension.    Mallampati Score: 1     Imaging: US Renal  Result Date: 11/14/2016 CLINICAL DATA:  Chronic renal disease. EXAM: RENAL / URINARY TRACT ULTRASOUND COMPLETE COMPARISON:  CT 05/30/2016 FINDINGS: Right Kidney: Length: 12.0 cm. increased renal echogenicity with  normal cortical thickness. Left Kidney: Length: 12.4 cm. Increased renal echogenicity with normal cortical thickness. Bladder: Thickened and irregular bladder wall. IMPRESSION: 1.  No hydronephrosis. 2. Increased renal echogenicity, suggesting medical renal disease. 3. Bladder wall thickening may represent cystitis or a component of outlet obstruction. Electronically Signed   By: Abigail Miyamoto M.D.   On: 11/14/2016 17:12    Labs:  CBC:  Recent Labs  09/18/16 1100 10/25/16 1414 12/06/16 1101 12/11/16 0836  WBC 6.8 6.9 5.9 6.1  HGB 6.5* 8.0* 7.1* 8.0*  HCT 19.8* 23.3* 20.6* 23.9*  PLT 358 214 235 202    COAGS:  Recent Labs  08/27/16 1413  INR 0.98    BMP:  Recent Labs  08/27/16 1413 08/28/16 1139 08/28/16 1642 09/18/16 1100  NA 131* 131* 132* 129*  K 3.7 3.4* 3.2* 4.3  CL 104 107  108 98*  CO2 24 21* 22 25  GLUCOSE 114* 111* 118* 109*  BUN 31* 27* 23* 21*  CALCIUM 7.7* 7.8* 7.6* 8.3*  CREATININE 1.93* 1.60* 1.62* 1.63*  GFRNONAA 35* 44* 43* 43*  GFRAA 41* 51* 50* 50*    LIVER FUNCTION TESTS:  Recent Labs  08/27/16 1413 08/28/16 1139 09/18/16 1100  BILITOT 0.4 0.3 0.4  AST 39 36 38  ALT '21 20 19  ' ALKPHOS 100 100 65  PROT 7.2 7.4 7.4  ALBUMIN 1.7* 1.8* 1.9*    TUMOR MARKERS: No results for input(s): AFPTM, CEA, CA199, CHROMGRNA in the last 8760 hours.  Assessment and Plan:  66 yo with normocytic anemia and request for bone marrow biopsy as part of the anemia workup.  Biopsy discussed with patient.  Patient understands risks and benefits and informed consent obtained.  Plan for CT-guided bone marrow biopsy with moderate sedation.   Thank you for this interesting consult.  I greatly enjoyed meeting [name] and look forward to participating in their care.  A copy of this report was sent to the requesting provider on this date.  Electronically Signed: Carylon Perches, MD 12/11/2016, 9:13 AM   I spent a total of  15 Minutes   in face to face in  clinical consultation, greater than 50% of which was counseling/coordinating care for a bone marrow biopsy.

## 2016-12-17 ENCOUNTER — Encounter: Payer: Medicare HMO | Attending: Surgery | Admitting: Surgery

## 2016-12-17 DIAGNOSIS — L97522 Non-pressure chronic ulcer of other part of left foot with fat layer exposed: Secondary | ICD-10-CM | POA: Diagnosis not present

## 2016-12-17 DIAGNOSIS — I87313 Chronic venous hypertension (idiopathic) with ulcer of bilateral lower extremity: Secondary | ICD-10-CM | POA: Insufficient documentation

## 2016-12-17 DIAGNOSIS — L97512 Non-pressure chronic ulcer of other part of right foot with fat layer exposed: Secondary | ICD-10-CM | POA: Insufficient documentation

## 2016-12-17 DIAGNOSIS — F1019 Alcohol abuse with unspecified alcohol-induced disorder: Secondary | ICD-10-CM | POA: Diagnosis not present

## 2016-12-17 DIAGNOSIS — F17218 Nicotine dependence, cigarettes, with other nicotine-induced disorders: Secondary | ICD-10-CM | POA: Diagnosis not present

## 2016-12-17 DIAGNOSIS — I509 Heart failure, unspecified: Secondary | ICD-10-CM | POA: Insufficient documentation

## 2016-12-17 DIAGNOSIS — I110 Hypertensive heart disease with heart failure: Secondary | ICD-10-CM | POA: Insufficient documentation

## 2016-12-17 DIAGNOSIS — E11621 Type 2 diabetes mellitus with foot ulcer: Secondary | ICD-10-CM | POA: Diagnosis not present

## 2016-12-17 DIAGNOSIS — I890 Lymphedema, not elsewhere classified: Secondary | ICD-10-CM | POA: Insufficient documentation

## 2016-12-18 NOTE — Progress Notes (Signed)
[name], [name] ([id number]) Visit Report for 12/17/2016 Arrival Information Details Patient Name: [name], [name] 12/17/2016 9:15 Date of Service: AM Medical Record [id number] Number: Patient Account Number: [id number] Date of Birth/Sex: 1950/05/20 (66 y.o. Male) Treating RN: Elyn Peers, Other Clinician: Primary Care Garry Nicolini: Olivia Mackie Treating Britto, Cherie Ouch, Rhesa Forsberg/Extender: Referring Haylee Mcanany: Harless Nakayama in Treatment: 12 Visit Information History Since Last Visit Added or deleted any medications: No Patient Arrived: Ambulatory Any new allergies or adverse reactions: No Arrival Time: 09:14 Had a fall or experienced change in No Accompanied By: brother activities of daily living that may affect Transfer Assistance: None risk of falls: Patient Identification Verified: Yes Signs or symptoms of abuse/neglect since last No Secondary Verification Process Yes visito Completed: Hospitalized since last visit: No Patient Requires Transmission-Based No Has Dressing in Place as Prescribed: Yes Precautions: Pain Present Now: No Patient Has Alerts: Yes Patient Alerts: DMII Electronic Signature(s) Signed: 12/17/2016 4:57:35 PM By: Montey Hora Entered By: Montey Hora on 12/17/2016 09:15:40 Dimmitt, [name] ([id number]) -------------------------------------------------------------------------------- Clinic Level of Care Assessment Details Patient Name: [name], [name] 12/17/2016 9:15 Date of Service: AM Medical Record [id number] Number: Patient Account Number: [id number] Date of Birth/Sex: March 14, 1951 (66 y.o. Male) Treating RN: Elyn Peers, Other Clinician: Primary Care Drexel Ivey: Olivia Mackie Treating Britto, Cherie Ouch, Krisandra Bueno/Extender: Referring Abri Vacca: Harless Nakayama in Treatment: 12 Clinic Level of Care Assessment Items TOOL 4 Quantity Score []  - Use when only an EandM is performed on FOLLOW-UP visit  0 ASSESSMENTS - Nursing Assessment / Reassessment X - Reassessment of Co-morbidities (includes updates in patient status) 1 10 X - Reassessment of Adherence to Treatment Plan 1 5 ASSESSMENTS - Wound and Skin Assessment / Reassessment []  - Simple Wound Assessment / Reassessment - one wound 0 X - Complex Wound Assessment / Reassessment - multiple wounds 6 5 []  - Dermatologic / Skin Assessment (not related to wound area) 0 ASSESSMENTS - Focused Assessment []  - Circumferential Edema Measurements - multi extremities 0 []  - Nutritional Assessment / Counseling / Intervention 0 X - Lower Extremity Assessment (monofilament, tuning fork, pulses) 1 5 []  - Peripheral Arterial Disease Assessment (using hand held doppler) 0 ASSESSMENTS - Ostomy and/or Continence Assessment and Care []  - Incontinence Assessment and Management 0 []  - Ostomy Care Assessment and Management (repouching, etc.) 0 PROCESS - Coordination of Care X - Simple Patient / Family Education for ongoing care 1 15 []  - Complex (extensive) Patient / Family Education for ongoing care 0 []  - Staff obtains Consents, Records, Test Results / Process Orders 0 Sarsfield, Darcy E. ([id number]) []  - Staff telephones HHA, Nursing Homes / Clarify orders / etc 0 []  - Routine Transfer to another Facility (non-emergent condition) 0 []  - Routine Hospital Admission (non-emergent condition) 0 []  - New Admissions / Biomedical engineer / Ordering NPWT, Apligraf, etc. 0 []  - Emergency Hospital Admission (emergent condition) 0 X - Simple Discharge Coordination 1 10 []  - Complex (extensive) Discharge Coordination 0 PROCESS - Special Needs []  - Pediatric / Minor Patient Management 0 []  - Isolation Patient Management 0 []  - Hearing / Language / Visual special needs 0 []  - Assessment of Community assistance (transportation, D/C planning, etc.) 0 []  - Additional assistance / Altered mentation 0 []  - Support Surface(s) Assessment (bed, cushion, seat, etc.)  0 INTERVENTIONS - Wound Cleansing / Measurement []  - Simple Wound Cleansing - one wound 0 X - Complex Wound Cleansing - multiple wounds 6 5 X - Wound Imaging (photographs - any number of wounds) 1  5 []  - Wound Tracing (instead of photographs) 0 []  - Simple Wound Measurement - one wound 0 X - Complex Wound Measurement - multiple wounds 6 5 INTERVENTIONS - Wound Dressings X - Small Wound Dressing one or multiple wounds 4 10 []  - Medium Wound Dressing one or multiple wounds 0 []  - Large Wound Dressing one or multiple wounds 0 X - Application of Medications - topical 1 5 []  - Application of Medications - injection 0 Bordenave, Keishon E. ([id number]) INTERVENTIONS - Miscellaneous []  - External ear exam 0 []  - Specimen Collection (cultures, biopsies, blood, body fluids, etc.) 0 []  - Specimen(s) / Culture(s) sent or taken to Lab for analysis 0 []  - Patient Transfer (multiple staff / Harrel Lemon Lift / Similar devices) 0 []  - Simple Staple / Suture removal (25 or less) 0 []  - Complex Staple / Suture removal (26 or more) 0 []  - Hypo / Hyperglycemic Management (close monitor of Blood Glucose) 0 []  - Ankle / Brachial Index (ABI) - do not check if billed separately 0 X - Vital Signs 1 5 Has the patient been seen at the hospital within the last three years: Yes Total Score: 190 Level Of Care: New/Established - Level 5 Electronic Signature(s) Signed: 12/17/2016 4:57:35 PM By: Montey Hora Entered By: Montey Hora on 12/17/2016 11:57:43 Garlow, [name] ([id number]) -------------------------------------------------------------------------------- Encounter Discharge Information Details Patient Name: [name], [name] 12/17/2016 9:15 Date of Service: AM Medical Record [id number] Number: Patient Account Number: [id number] Date of Birth/Sex: [birth year] (66 y.o. Male) Treating RN: Elyn Peers, Other Clinician: Primary Care Jaritza Duignan: Olivia Mackie Treating Britto, Cherie Ouch,  Trine Fread/Extender: Referring Viraat Vanpatten: Harless Nakayama in Treatment: 12 Encounter Discharge Information Items Discharge Pain Level: 0 Discharge Condition: Stable Ambulatory Status: Ambulatory Discharge Destination: Home Transportation: Private Auto Accompanied By: brother Schedule Follow-up Appointment: Yes Medication Reconciliation completed and provided to Patient/Care No Sabryna Lahm: Provided on Clinical Summary of Care: 12/17/2016 Form Type Recipient Paper Patient ED Electronic Signature(s) Signed: 12/18/2016 8:13:46 AM By: Ruthine Dose Entered By: Ruthine Dose on 12/17/2016 09:54:07 Falcon, [name] ([id number]) -------------------------------------------------------------------------------- Lower Extremity Assessment Details Patient Name: BERYLE, ZEITZ 12/17/2016 9:15 Date of Service: AM Medical Record [id number] Number: Patient Account Number: [id number] Date of Birth/Sex: July 28, 1950 (66 y.o. Male) Treating RN: Elyn Peers, Other Clinician: Primary Care Juliona Vales: Olivia Mackie Treating Britto, Cherie Ouch, Sharnetta Gielow/Extender: Referring Lamichael Youkhana: Harless Nakayama in Treatment: 12 Vascular Assessment Pulses: Dorsalis Pedis Palpable: [Left:Yes] [Right:Yes] Posterior Tibial Extremity colors, hair growth, and conditions: Extremity Color: [Left:Hyperpigmented] [Right:Hyperpigmented] Hair Growth on Extremity: [Left:No] [Right:No] Temperature of Extremity: [Left:Warm] [Right:Warm] Capillary Refill: [Left:< 3 seconds] [Right:< 3 seconds] Electronic Signature(s) Signed: 12/17/2016 4:57:35 PM By: Montey Hora Entered By: Montey Hora on 12/17/2016 09:27:55 Brathwaite, [name] ([id number]) -------------------------------------------------------------------------------- Multi Wound Chart Details Patient Name: [name]. 12/17/2016 9:15 Date of Service: AM Medical Record [id number] Number: Patient Account Number: [id number] Date of Birth/Sex: 09-May-1950  (66 y.o. 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 ([id number]) Pending Amputation on Yes Yes Yes Presentation: Measurements L x W x D 1.5x5.8x0.2 1.3x1.2x0.1 0x0x0 (cm) Area (cm) : 6.833 1.225 0 Volume (cm) : 1.367 0.123 0 % Reduction in Area: 85.50% 60.10% 100.00% % Reduction in Volume: 71.00% 59.90% 100.00% Classification: Grade 1 Grade 1 Grade 1 Exudate Amount: Large Large N/A Exudate Type: Serosanguineous Serosanguineous N/A Exudate Color: red, brown red, brown N/A Foul Odor After Yes Yes N/A Cleansing: Odor Anticipated Due to No No N/A Product Use: Wound Margin: Flat and Intact Flat and Intact N/A Granulation Amount: Large (67-100%) Large (67-100%) N/A Granulation Quality: Pink Pink N/A Necrotic Amount: None Present (0%) None Present (0%) N/A Exposed Structures: Fat Layer (Subcutaneous Fat  Layer (Subcutaneous N/A Tissue) Exposed: Yes Tissue) Exposed: Yes Fascia: No Fascia: No Tendon: No Tendon: No Muscle: No Muscle: No Joint: No Joint: No Bone: No Bone: No Epithelialization: Large (67-100%) Small (1-33%) N/A Periwound Skin Texture: Excoriation: Yes Excoriation: Yes No Abnormalities Noted Induration: No Induration: No Callus: No Callus: No Crepitus: No Crepitus: No Rash: No Rash: No Scarring: No Scarring: No Periwound Skin Maceration: Yes Maceration: Yes No Abnormalities Noted Moisture: Dry/Scaly: No Dry/Scaly: No Periwound Skin Color: Atrophie Blanche: No Atrophie Blanche: No No Abnormalities Noted Cyanosis: No Cyanosis: No Ecchymosis: No Ecchymosis: No Erythema: No Erythema: No Hemosiderin Staining: No Hemosiderin Staining: No Mottled: No Mottled: No Pallor: No Pallor: No Rubor: No Rubor: No Temperature: No Abnormality No Abnormality N/A Tenderness on No No No Palpation: Wound Preparation: Ulcer Cleansing: Ulcer Cleansing: N/A Rinsed/Irrigated with Rinsed/Irrigated with Saline Saline Bensen, Demareon E. 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 the Lower Extremity the Lower Extremity Secondary Etiology: N/A N/A Pressure Ulcer Comorbid History: Anemia, Lymphedema, Anemia, Lymphedema, N/A Congestive Heart Failure, Congestive Heart Failure, Hypertension, Peripheral Hypertension, Peripheral Venous Disease, Type II Venous Disease, Type II Diabetes, Neuropathy Diabetes, Neuropathy Date Acquired: 06/11/2016 06/11/2016 10/14/2016 Weeks of Treatment: 12 12 9  Wound Status: Open Open Healed - Epithelialized Pending  Amputation on Yes Yes No Presentation: Measurements L x W x D 0.5x0.7x0.1 0.1x0.1x0.1 0x0x0 (cm) Area (cm) : 0.275 0.008 0 Volume (cm) : 0.027 0.001 0 % Reduction in Area: 98.10% 99.60% 100.00% % Reduction in Volume: 98.10% 99.50% 100.00% Classification: Grade 1 Grade 1 Grade 1 Exudate Amount: Large Large N/A Exudate Type: Serous Serous N/A Exudate Color: amber amber N/A Foul Odor After Yes Yes N/A Cleansing: Odor Anticipated Due to No No N/A Product Use: Wound Margin: Flat and Intact Flat and Intact N/A Granulation Amount: Large (67-100%) Large (67-100%) N/A Granulation Quality: Pink Pink N/A Necrotic Amount: None Present (0%) None Present (0%) N/A Exposed Structures: Fat Layer (Subcutaneous Fat Layer (Subcutaneous N/A Tissue) Exposed: Yes Tissue) Exposed: Yes Fascia: No Fascia: No Tendon: No Tendon: No Muscle: No Muscle: No Joint: No Joint: No Bone: No Bone: No Epithelialization: Small (1-33%) Small (1-33%) N/A Periwound Skin Texture: Excoriation: Yes Excoriation: Yes No Abnormalities Noted Induration: No Induration: No Hinton, Nole E. ([id number]) Callus: No Callus: No  Crepitus: No Crepitus: No Rash: No Rash: No Scarring: No Scarring: No Periwound Skin Maceration: No Maceration: Yes No Abnormalities Noted Moisture: Dry/Scaly: No Dry/Scaly: No Periwound Skin Color: Atrophie Blanche: No Atrophie Blanche: No No Abnormalities Noted Cyanosis: No Cyanosis: No Ecchymosis: No Ecchymosis: No Erythema: No Erythema: No Hemosiderin Staining: No Hemosiderin Staining: No Mottled: No Mottled: No Pallor: No Pallor: No Rubor: No Rubor: No Temperature: No Abnormality No Abnormality N/A Tenderness on No No No Palpation: Wound Preparation: Ulcer Cleansing: Ulcer Cleansing: N/A Rinsed/Irrigated with Rinsed/Irrigated with Saline Saline Topical Anesthetic Topical Anesthetic Applied: None Applied: None Treatment Notes Electronic Signature(s) Signed:  12/17/2016 4:10:50 PM By: Christin Fudge MD, FACS Entered By: Christin Fudge on 12/17/2016 09:38:03 Kellett, [name] ([id number]) -------------------------------------------------------------------------------- Multi-Disciplinary Care Plan Details Patient Name: WAEL, MAESTAS 12/17/2016 9:15 Date of Service: AM Medical Record [id number] Number: Patient Account Number: [id number] Date of Birth/Sex: 05-10-1950 (66 y.o. Male) Treating RN: Elyn Peers, Other Clinician: Primary Care Kenyatta Gloeckner: Olivia Mackie Treating Britto, Cherie Ouch, Keyon Liller/Extender: Referring Saadiya Wilfong: Harless Nakayama in Treatment: 12 Active Inactive ` Abuse / Safety / Falls / Self Care Management Nursing Diagnoses: Potential for falls Goals: Patient will remain injury free related to falls Date Initiated: 09/21/2016 Target Resolution Date: 11/30/2016 Goal Status: Active Interventions: Assess fall risk on admission and as needed Notes: ` Nutrition Nursing Diagnoses: Potential for alteratiion in Nutrition/Potential for imbalanced nutrition Goals: Patient/caregiver agrees to and verbalizes understanding of need to use nutritional supplements and/or vitamins as prescribed Date Initiated: 09/21/2016 Target Resolution Date: 11/30/2016 Goal Status: Active Interventions: Assess patient nutrition upon admission and as needed per policy Notes: ` Orientation to the [facility]. ([id number]) Nursing Diagnoses: Knowledge deficit related to the wound healing center program Goals: Patient/caregiver will verbalize understanding of the [facility] Program Date Initiated: 09/21/2016 Target Resolution Date: 11/30/2016 Goal Status: Active Interventions: Provide education on orientation to the wound center Notes: ` Wound/Skin Impairment Nursing Diagnoses: Knowledge deficit related to smoking impact on wound healing Goals: Ulcer/skin breakdown will have a volume reduction  of 30% by week 4 Date Initiated: 09/21/2016 Target Resolution Date: 11/30/2016 Goal Status: Active Ulcer/skin breakdown will have a volume reduction of 50% by week 8 Date Initiated: 09/21/2016 Target Resolution Date: 11/30/2016 Goal Status: Active Ulcer/skin breakdown will have a volume reduction of 80% by week 12 Date Initiated: 09/21/2016 Target Resolution Date: 11/30/2016 Goal Status: Active Ulcer/skin breakdown will heal within 14 weeks Date Initiated: 09/21/2016 Target Resolution Date: 11/30/2016 Goal Status: Active Interventions: Assess patient/caregiver ability to obtain necessary supplies Assess patient/caregiver ability to perform ulcer/skin care regimen upon admission and as needed Assess ulceration(s) every visit Notes: Electronic Signature(s) Signed: 12/17/2016 4:57:35 PM By: Montey Hora Entered By: Montey Hora on 12/17/2016 09:32:48 Fowers, [name] ([id number]) -------------------------------------------------------------------------------- Pain Assessment Details Patient Name: [name]. 12/17/2016 9:15 Date of Service: AM Medical Record [id number] Number: Patient Account Number: [id number] Date of Birth/Sex: [birth year] (66 y.o. Male) Treating RN: Elyn Peers, Other Clinician: Primary Care Gerturde Kuba: Olivia Mackie Treating Britto, Cherie Ouch, Murial Beam/Extender: Referring Tanashia Ciesla: Harless Nakayama in Treatment: 12 Active Problems Location of Pain Severity and Description of Pain Patient Has Paino No Site Locations Pain Management and Medication Current Pain Management: Notes Topical or injectable lidocaine is offered to patient for acute pain when surgical debridement is performed. If needed, Patient is instructed to use over the counter pain medication for the following 24-48 hours after debridement. Wound care MDs do not prescribed pain medications. Patient has chronic pain or  uncontrolled pain. Patient has been instructed to make an  appointment with their Primary Care Physician for pain management. Electronic Signature(s) Signed: 12/17/2016 4:57:35 PM By: Montey Hora Entered By: Montey Hora on 12/17/2016 09:15:49 Libman, [name] ([id number]) -------------------------------------------------------------------------------- Patient/Caregiver Education Details Patient Name: HOSTEEN, KIENAST 12/17/2016 9:15 Date of Service: AM Medical Record [id number] Number: Patient Account Number: [id number] Date of Birth/Gender: 06-17-1950 (66 y.o. Male) Treating RN: Elyn Peers, Other Clinician: Primary Care Physician: Olivia Mackie Treating Britto, Cherie Ouch, Physician/Extender: Referring Physician: Harless Nakayama in Treatment: 12 Education Assessment Education Provided To: Patient Education Topics Provided Wound/Skin Impairment: Handouts: Other: wound care as ordered and please wash daily with soap and water Methods: Demonstration, Explain/Verbal Responses: State content correctly Electronic Signature(s) Signed: 12/17/2016 4:57:35 PM By: Montey Hora Entered By: Montey Hora on 12/17/2016 09:34:08 Trinka, [name] ([id number]) -------------------------------------------------------------------------------- Wound Assessment Details Patient Name: [name]. 12/17/2016 9:15 Date of Service: AM Medical Record [id number] Number: Patient Account Number: [id number] Date of Birth/Sex: January 20, 1951 (66 y.o. Male) Treating RN: Elyn Peers, Other Clinician: Primary Care Tylon Kemmerling: Olivia Mackie Treating Britto, Cherie Ouch, Yurani Fettes/Extender: Referring Consepcion Utt: Harless Nakayama in Treatment: 12 Wound Status Wound Number: 1 Primary Diabetic Wound/Ulcer of the Lower Etiology: Extremity Wound Location: Right Toe Great Wound Open Wounding Event: Gradually Appeared Status: Date Acquired: 06/11/2016 Comorbid Anemia, Lymphedema, Congestive Weeks Of Treatment: 12 History: Heart  Failure, Hypertension, Peripheral Clustered Wound: No Venous Disease, Type II Diabetes, Pending Amputation On Presentation Neuropathy Photos Photo Uploaded By: Montey Hora on 12/17/2016 09:31:08 Wound Measurements Length: (cm) 1.5 Width: (cm) 5.8 Depth: (cm) 0.2 Area: (cm) 6.833 Volume: (cm) 1.367 % Reduction in Area: 85.5% % Reduction in Volume: 71% Epithelialization: Large (67-100%) Tunneling: No Undermining: No Wound Description Classification: Grade 1 Foul Odor After Wound Margin: Flat and Intact Due to Product Exudate Amount: Large Slough/Fibrino Exudate Type: Serosanguineous Exudate Color: red, brown Cleansing: Yes Use: No Yes Wound Bed Sahagun, Glennis E. ([id number]) Granulation Amount: Large (67-100%) Exposed Structure Granulation Quality: Pink Fascia Exposed: No Necrotic Amount: None Present (0%) Fat Layer (Subcutaneous Tissue) Exposed: Yes Tendon Exposed: No Muscle Exposed: No Joint Exposed: No Bone Exposed: No Periwound Skin Texture Texture Color No Abnormalities Noted: No No Abnormalities Noted: No Callus: No Atrophie Blanche: No Crepitus: No Cyanosis: No Excoriation: Yes Ecchymosis: No Induration: No Erythema: No Rash: No Hemosiderin Staining: No Scarring: No Mottled: No Pallor: No Moisture Rubor: No No Abnormalities Noted: No Dry / Scaly: No Temperature / Pain Maceration: Yes Temperature: No Abnormality Wound Preparation Ulcer Cleansing: Rinsed/Irrigated with Saline Topical Anesthetic Applied: None Treatment Notes Wound #1 (Right Toe Great) 1. Cleansed with: Clean wound with Normal Saline 3. Peri-wound Care: Antifungal cream 4. Dressing Applied: Aquacel Ag 5. Secondary Dressing Applied Dry Gauze Kerlix/Conform 7. Secured with Tape Notes netting Electronic Signature(s) Signed: 12/17/2016 4:57:35 PM By: Montey Hora Entered By: Montey Hora on 12/17/2016 09:26:35 Pelzel, SAKET HELLSTROM ([id number]) Conger, CHASTON BRADBURN  ([id number]) -------------------------------------------------------------------------------- Wound Assessment Details Patient Name: DARRIOUS, YOUMAN 12/17/2016 9:15 Date of Service: AM Medical Record [id number] Number: Patient Account Number: [id number] Date of Birth/Sex: 14-May-1950 (66 y.o. Male) Treating RN: Elyn Peers, Other Clinician: Primary Care Cheray Pardi: Olivia Mackie Treating Britto, Cherie Ouch, Ladona Rosten/Extender: Referring Lashonna Rieke: Harless Nakayama in Treatment: 12 Wound Status Wound Number: 2 Primary Diabetic Wound/Ulcer of the Lower Etiology: Extremity Wound Location: Right Toe Second Wound Open Wounding Event: Gradually Appeared Status: Date Acquired: 06/11/2016 Comorbid Anemia, Lymphedema, Congestive Weeks Of Treatment: 12 History: Heart Failure, Hypertension, Peripheral Clustered Wound: No Venous  Disease, Type II Diabetes, Pending Amputation On Presentation Neuropathy Photos Photo Uploaded By: Montey Hora on 12/17/2016 10:11:34 Wound Measurements Length: (cm) 1.3 Width: (cm) 1.2 Depth: (cm) 0.1 Area: (cm) 1.225 Volume: (cm) 0.123 % Reduction in Area: 60.1% % Reduction in Volume: 59.9% Epithelialization: Small (1-33%) Tunneling: No Undermining: No Wound Description Classification: Grade 1 Foul Odor After Wound Margin: Flat and Intact Due to Product Exudate Amount: Large Slough/Fibrino Exudate Type: Serosanguineous Exudate Color: red, brown Cleansing: Yes Use: No Yes Wound Bed Mcvay, Refoel E. ([id number]) Granulation Amount: Large (67-100%) Exposed Structure Granulation Quality: Pink Fascia Exposed: No Necrotic Amount: None Present (0%) Fat Layer (Subcutaneous Tissue) Exposed: Yes Tendon Exposed: No Muscle Exposed: No Joint Exposed: No Bone Exposed: No Periwound Skin Texture Texture Color No Abnormalities Noted: No No Abnormalities Noted: No Callus: No Atrophie Blanche: No Crepitus: No Cyanosis: No Excoriation:  Yes Ecchymosis: No Induration: No Erythema: No Rash: No Hemosiderin Staining: No Scarring: No Mottled: No Pallor: No Moisture Rubor: No No Abnormalities Noted: No Dry / Scaly: No Temperature / Pain Maceration: Yes Temperature: No Abnormality Wound Preparation Ulcer Cleansing: Rinsed/Irrigated with Saline Topical Anesthetic Applied: None Treatment Notes Wound #2 (Right Toe Second) 1. Cleansed with: Clean wound with Normal Saline 3. Peri-wound Care: Antifungal cream 4. Dressing Applied: Aquacel Ag 5. Secondary Dressing Applied Dry Gauze Kerlix/Conform 7. Secured with Tape Notes netting Electronic Signature(s) Signed: 12/17/2016 4:57:35 PM By: Montey Hora Entered By: Montey Hora on 12/17/2016 09:26:53 Wubben, NEZIAH BRALEY ([id number]) Escorcia, ASHVIK GRUNDMAN ([id number]) -------------------------------------------------------------------------------- Wound Assessment Details Patient Name: DELAND, SLOCUMB 12/17/2016 9:15 Date of Service: AM Medical Record [id number] Number: Patient Account Number: [id number] Date of Birth/Sex: 1950/04/20 (66 y.o. Male) Treating RN: Elyn Peers, Other Clinician: Primary Care Ryllie Nieland: Olivia Mackie Treating Britto, Cherie Ouch, Kyla Duffy/Extender: Referring Amarilys Lyles: Harless Nakay[place] in Treatment: 12 Wound Status Wound Number: 4 Primary Diabetic Wound/Ulcer of the Lower Etiology: Extremity Wound Location: Right, Dorsal Foot Wound Status: Healed - Epithelialized Wounding Event: Gradually Appeared Date Acquired: 06/11/2016 Weeks Of Treatment: 12 Clustered Wound: No Pending Amputation On Presentation Photos Photo Uploaded By: Montey Hora on 12/17/2016 10:11:35 Wound Measurements Length: (cm) 0 Width: (cm) 0 Depth: (cm) 0 Area: (cm) 0 Volume: (cm) 0 % Reduction in Area: 100% % Reduction in Volume: 100% Wound Description Classification: Grade 1 Periwound Skin Texture Texture Color No Abnormalities Noted:  No No Abnormalities Noted: No Moisture No Abnormalities Noted: No Henrie, JAKAIDEN FILL ([id number]) Electronic Signature(s) Signed: 12/17/2016 4:57:35 PM By: Montey Hora Entered By: Montey Hora on 12/17/2016 09:23:56 Faughnan, [name] ([id number]) -------------------------------------------------------------------------------- Wound Assessment Details Patient Name: [name]. 12/17/2016 9:15 Date of Service: AM Medical Record [id number] Number: Patient Account Number: [id number] Date of Birth/Sex: 1950-04-12 (66 y.o. Male) Treating RN: Elyn Peers, Other Clinician: Primary Care Yassmine Tamm: Olivia Mackie Treating Britto, Cherie Ouch, Illene Sweeting/Extender: Referring Hugh Garrow: Harless Nakayama in Treatment: 12 Wound Status Wound Number: 5 Primary Diabetic Wound/Ulcer of the Lower Etiology: Extremity Wound Location: Left Toe Great Wound Open Wounding Event: Gradually Appeared Status: Date Acquired: 06/11/2016 Comorbid Anemia, Lymphedema, Congestive Weeks Of Treatment: 12 History: Heart Failure, Hypertension, Peripheral Clustered Wound: No Venous Disease, Type II Diabetes, Pending Amputation On Presentation Neuropathy Photos Photo Uploaded By: Montey Hora on 12/17/2016 10:12:21 Wound Measurements Length: (cm) 0.5 Width: (cm) 0.7 Depth: (cm) 0.1 Area: (cm) 0.275 Volume: (cm) 0.027 % Reduction in Area: 98.1% % Reduction in Volume: 98.1% Epithelialization: Small (1-33%) Tunneling: No Undermining: No Wound Description Classification: Grade 1 Foul Odor After Wound Margin: Flat and Intact  Due to Product Exudate Amount: Large Slough/Fibrino Exudate Type: Serous Exudate Color: amber Cleansing: Yes Use: No Yes Wound Bed Rebert, Juliano E. ([id number]) Granulation Amount: Large (67-100%) Exposed Structure Granulation Quality: Pink Fascia Exposed: No Necrotic Amount: None Present (0%) Fat Layer (Subcutaneous Tissue) Exposed: Yes Tendon Exposed:  No Muscle Exposed: No Joint Exposed: No Bone Exposed: No Periwound Skin Texture Texture Color No Abnormalities Noted: No No Abnormalities Noted: No Callus: No Atrophie Blanche: No Crepitus: No Cyanosis: No Excoriation: Yes Ecchymosis: No Induration: No Erythema: No Rash: No Hemosiderin Staining: No Scarring: No Mottled: No Pallor: No Moisture Rubor: No No Abnormalities Noted: No Dry / Scaly: No Temperature / Pain Maceration: No Temperature: No Abnormality Wound Preparation Ulcer Cleansing: Rinsed/Irrigated with Saline Topical Anesthetic Applied: None Treatment Notes Wound #5 (Left Toe Great) 1. Cleansed with: Clean wound with Normal Saline 3. Peri-wound Care: Antifungal cream 4. Dressing Applied: Aquacel Ag 5. Secondary Dressing Applied Dry Gauze Kerlix/Conform 7. Secured with Tape Notes netting Electronic Signature(s) Signed: 12/17/2016 4:57:35 PM By: Montey Hora Entered By: Montey Hora on 12/17/2016 09:27:12 Delrosario, MAYA SCHOLER ([id number]) Noga, JOSHAWA DUBIN ([id number]) -------------------------------------------------------------------------------- Wound Assessment Details Patient Name: BRAELYN, BORDONARO 12/17/2016 9:15 Date of Service: AM Medical Record [id number] Number: Patient Account Number: [id number] Date of Birth/Sex: 07-13-1950 (66 y.o. Male) Treating RN: Elyn Peers, Other Clinician: Primary Care Kyanne Rials: Olivia Mackie Treating Britto, Cherie Ouch, Orian Figueira/Extender: Referring Jameon Deller: Harless Nakayama in Treatment: 12 Wound Status Wound Number: 6 Primary Diabetic Wound/Ulcer of the Lower Etiology: Extremity Wound Location: Left Toe Second Wound Open Wounding Event: Gradually Appeared Status: Date Acquired: 06/11/2016 Comorbid Anemia, Lymphedema, Congestive Weeks Of Treatment: 12 History: Heart Failure, Hypertension, Peripheral Clustered Wound: No Venous Disease, Type II Diabetes, Pending Amputation On  Presentation Neuropathy Photos Photo Uploaded By: Montey Hora on 12/17/2016 10:12:22 Wound Measurements Length: (cm) 0.1 Width: (cm) 0.1 Depth: (cm) 0.1 Area: (cm) 0.008 Volume: (cm) 0.001 % Reduction in Area: 99.6% % Reduction in Volume: 99.5% Epithelialization: Small (1-33%) Tunneling: No Undermining: No Wound Description Classification: Grade 1 Foul Odor After Wound Margin: Flat and Intact Due to Product Exudate Amount: Large Slough/Fibrino Exudate Type: Serous Exudate Color: amber Cleansing: Yes Use: No Yes Wound Bed Kita, Aniceto E. ([id number]) Granulation Amount: Large (67-100%) Exposed Structure Granulation Quality: Pink Fascia Exposed: No Necrotic Amount: None Present (0%) Fat Layer (Subcutaneous Tissue) Exposed: Yes Tendon Exposed: No Muscle Exposed: No Joint Exposed: No Bone Exposed: No Periwound Skin Texture Texture Color No Abnormalities Noted: No No Abnormalities Noted: No Callus: No Atrophie Blanche: No Crepitus: No Cyanosis: No Excoriation: Yes Ecchymosis: No Induration: No Erythema: No Rash: No Hemosiderin Staining: No Scarring: No Mottled: No Pallor: No Moisture Rubor: No No Abnormalities Noted: No Dry / Scaly: No Temperature / Pain Maceration: Yes Temperature: No Abnormality Wound Preparation Ulcer Cleansing: Rinsed/Irrigated with Saline Topical Anesthetic Applied: None Treatment Notes Wound #6 (Left Toe Second) 1. Cleansed with: Clean wound with Normal Saline 3. Peri-wound Care: Antifungal cream 4. Dressing Applied: Aquacel Ag 5. Secondary Dressing Applied Dry Gauze Kerlix/Conform 7. Secured with Tape Notes netting Electronic Signature(s) Signed: 12/17/2016 4:57:35 PM By: Montey Hora Entered By: Montey Hora on 12/17/2016 09:27:31 Neubauer, BOOKER BHATNAGAR ([id number]) LEVIN, DAGOSTINO ([id number]) -------------------------------------------------------------------------------- Wound Assessment Details Patient Name:  NICHOLAI, WILLETTE 12/17/2016 9:15 Date of Service: AM Medical Record [id number] Number: Patient Account Number: [id number] Date of Birth/Sex: February 19, 1951 (66 y.o. Male) Treating RN: Elyn Peers, Other Clinician: Primary Care Shemar Plemmons: TRACY Treating Britto, Cherie Ouch, Kaliyan Osbourn/Extender: Referring Bricia Taher: Harless Nakayama  in Treatment: 12 Wound Status Wound Number: 7 Primary Etiology: Diabetic Wound/Ulcer of the Lower Extremity Wound Location: Left Toe Third Secondary Pressure Ulcer Wounding Event: Gradually Appeared Etiology: Date Acquired: 10/14/2016 Wound Status: Healed - Epithelialized Weeks Of Treatment: 9 Clustered Wound: No Photos Photo Uploaded By: Montey Hora on 12/17/2016 10:12:40 Wound Measurements Length: (cm) 0 Width: (cm) 0 Depth: (cm) 0 Area: (cm) 0 Volume: (cm) 0 % Reduction in Area: 100% % Reduction in Volume: 100% Wound Description Classification: Grade 1 Periwound Skin Texture Texture Color No Abnormalities Noted: No No Abnormalities Noted: No Moisture No Abnormalities Noted: No Oriordan, Dalessandro E. ([id number]) Electronic Signature(s) Signed: 12/17/2016 4:57:35 PM By: Montey Hora Entered By: Montey Hora on 12/17/2016 09:24:37 Balcerzak, [name] ([id number]) -------------------------------------------------------------------------------- Vitals Details Patient Name: [name]. 12/17/2016 9:15 Date of Service: AM Medical Record [id number] Number: Patient Account Number: [id number] Date of Birth/Sex: [birth year] (66 y.o. Male) Treating RN: Elyn Peers, Other Clinician: Primary Care Lizandro Spellman: Olivia Mackie Treating Britto, Cherie Ouch, Marcel Sorter/Extender: Referring Tinsley Everman: Harless Nakayama in Treatment: 12 Vital Signs Time Taken: 09:15 Temperature (F): 98.2 Height (in): 69 Pulse (bpm): 58 Weight (lbs): 168 Respiratory Rate (breaths/min): 16 Body Mass Index (BMI): 24.8 Blood Pressure  (mmHg): 163/79 Reference Range: 80 - 120 mg / dl Electronic Signature(s) Signed: 12/17/2016 4:57:35 PM By: Montey Hora Entered By: Montey Hora on 12/17/2016 09:17:53

## 2016-12-18 NOTE — Progress Notes (Signed)
JAQUIS, PICKLESIMER ([id number]) Visit Report for 12/17/2016 Chief Complaint Document Details Patient Name: [name], [name] 12/17/2016 9:15 Date of Service: AM Medical Record [id number] Number: Patient Account Number: [id number] Date of Birth/Sex: 1950-12-25 (66 y.o. Male) Treating RN: Elyn Peers, Other Clinician: Primary Care Provider: Olivia Mackie Treating Paizleigh Wilds, Cherie Ouch, Provider/Extender: Referring Provider: Harless Nakayama in Treatment: 12 Information Obtained from: Patient Chief Complaint Patients presents for treatment of an open diabetic ulcer to both feet Electronic Signature(s) Signed: 12/17/2016 4:10:50 PM By: Christin Fudge MD, FACS Entered By: Christin Fudge on 12/17/2016 09:38:12 Payette, HAWTHORNE DAY ([id number]) -------------------------------------------------------------------------------- HPI Details Patient Name: [name], [name] 12/17/2016 9:15 Date of Service: AM Medical Record [id number] Number: Patient Account Number: [id number] Date of Birth/Sex: 06/23/1950 (66 y.o. Male) Treating RN: Elyn Peers, Other Clinician: Primary Care Provider: Olivia Mackie Treating Chrishonda Hesch, Cherie Ouch, Provider/Extender: Referring Provider: Harless Nakayama in Treatment: 12 History of Present Illness Location: bilateral feet ulceration on the toes Quality: Patient reports experiencing a dull pain to affected area(s). Severity: Patient states wound are getting better Duration: Patient has had the wound for > 3 months prior to seeking treatment at the wound center Timing: Pain in wound is constant (hurts all the time) Context: The wound would happen gradually Modifying Factors: Other treatment(s) tried include:treatment for lymphedema and is seen by the podiatrist Dr. Caryl Comes Associated Signs and Symptoms: Patient reports having increase swelling. HPI Description: 66 year old patient here to see as for bilateral feet ulceration to on his left first  and second toe and 2 on his right first and second toe, which she's had for about 4 months. 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 Donella Stade. The patient is also undergoing workup by medical oncology for a intra-abdominal lymphadenopathy. 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 He is also status post appendectomy, inguinal lymph node biopsy, rectal biopsy and rectal examination under anesthesia. he currently smokes cigarettes about half packet a day. In March of this year he was seen by Dr. Hortencia Pilar, for evaluation of bilateral varicose veins and besides wearing compression stockings he had recommended laser ablation of the right and left great saphenous veins to eleviate the symptoms and complications of severe superficial venous reflux disease. He also recommended lymphedema pumps for better control of his lymphedema. The patient recently has had on 08/23/2016, right greater saphenous vein ablation with the laser energy Earlier lower extremity venous reflux examination done on 05/08/2016 showed no DVT or SVT both lower legs but incompetence of bilateral great saphenous veins was present. A lower arterial study was also done and there was no significant right lower and left lower extremity problems based on a normal toe brachial index bilaterally and the ABI was 1.21 the left and 1.23 on the right. His post ablation venous duplex examination showed successful ablation of the right GS vein with thrombus formation 2 below the right saphenofemoral junction. The deep system was patent without evidence of thrombosis and this was done on 08/30/2016. the patient also has a squamous cell cancer of the skin of the buttock and is recently  undergone radiation therapy for this prior to excisional surgery. Addendum: regarding his x-rays done today and x-ray of the left foot -- IMPRESSION: No objective Custard, [name] ([id number]) evidence of osteomyelitis.  There are soft tissue changes which may reflect cellulitis. X-ray of the right foot -- IMPRESSION:Findings compatible with cellulitis of the toes. No objective evidence of osteomyelitis is observed. 10/01/16 on evaluation today patient's wounds appeared to be doing some better. I did review the x-rays as well which showed no evidence of osteomyelitis although there was evidence on x-ray of cellulitis. He fortunately is not having any discomfort although he continues to have some swelling. He does not remember being on any antibiotics recently. 10/15/16 on evaluation today patient's wounds overall appear to be doing better although he does have a new location noted on the left foot. Fortunately he is not having significant pain. It almost has the appearance that something is rubbing on the end of his toes but he wears the open toe shoes and according to what he is telling me never wears anything that would rub on his foot. There is no evidence of infection and specifically no evidence of a fungal infection 10/22/16 On evaluation today patient's wounds appeared to be doing better compared to last week in regard to his bilateral lower extremities. Fortunately I happy with how things are progressing although he still has ulcers I feel like that he is improving and appropriate manner. 11/12/16 on evaluation today patient appears to be doing well in regard to his bilateral feet and the respective wounds. We have been using surrounding her dressings along with an antifungal cream which seems to be doing very well. He has no bilateral dysfunction noticed that the rituals are weight loss at this point. He also has no nausea or vomiting a note purulent discharge. He did see Vein [facility] vascular  today and he tells me that they told him he could have surgery for his venous stasis but they did not feel like it was worth it in his words. Fortunately patient's wounds do appear to be getting sneakily better. 11/26/2016 -- he says he is going to have some surgery during this week at [facility] for possibly a colon resection. Electronic Signature(s) Signed: 12/17/2016 4:10:50 PM By: Christin Fudge MD, FACS Entered By: Christin Fudge on 12/17/2016 09:39:05 Devincent, DERREN SUYDAM ([id number]) -------------------------------------------------------------------------------- Physical Exam Details Patient Name: LEIGH, KAEDING 12/17/2016 9:15 Date of Service: AM Medical Record [id number] Number: Patient Account Number: [id number] Date of Birth/Sex: 1950/07/16 (66 y.o. Male) Treating RN: Elyn Peers, Other Clinician: Primary Care Provider: Olivia Mackie Treating Khalessi Blough, Cherie Ouch, Provider/Extender: Referring Provider: Harless Nakayama in Treatment: 12 Constitutional . Pulse regular. Respirations normal and unlabored. Afebrile. . Eyes Nonicteric. Reactive to light. Ears, Nose, Mouth, and Throat Lips, teeth, and gums WNL.Marland Kitchen Moist mucosa without lesions. Neck supple and nontender. No palpable supraclavicular or cervical adenopathy. Normal sized without goiter. Respiratory WNL. No retractions.. Cardiovascular Pedal Pulses WNL. No clubbing, cyanosis or edema. Lymphatic No adneopathy. No adenopathy. No adenopathy. Musculoskeletal Adexa without tenderness or enlargement.. Digits and nails w/o clubbing, cyanosis, infection, petechiae, ischemia, or inflammatory conditions.. Integumentary (Hair, Skin) No suspicious lesions. No crepitus or fluctuance. No peri-wound warmth or erythema. No masses.Marland Kitchen Psychiatric Judgement and insight Intact.. No evidence of depression, anxiety, or agitation.. Notes he still has an element of fungal infection but other than that his wounds are  looking very clean and there is no evidence of any superficial or deep or debris which need sharp debridement Electronic Signature(s) Signed: 12/17/2016 4:10:50 PM By: Christin Fudge MD, FACS Entered By: Christin Fudge on 12/17/2016 09:39:37 Schwarzkopf, [name] ([id number]) -------------------------------------------------------------------------------- Physician Orders Details Patient Name: Raven, Justine E.  12/17/2016 9:15 Date of Service: AM Medical Record [id number] Number: Patient Account Number: [id number] Date of Birth/Sex: 31-Mar-1951 (66 y.o. Male) Treating RN: Elyn Peers, Other Clinician: Primary Care Provider: Olivia Mackie Treating Kaile Bixler, Cherie Ouch, Provider/Extender: Referring Provider: Harless Nakayama in Treatment: 12 Verbal / Phone Orders: No Diagnosis Coding Wound Cleansing Wound #1 Right Toe Great o Cleanse wound with mild soap and water o May Shower, gently pat wound dry prior to applying new dressing. Wound #2 Right Toe Second o Cleanse wound with mild soap and water o May Shower, gently pat wound dry prior to applying new dressing. Wound #5 Left Toe Great o Cleanse wound with mild soap and water o May Shower, gently pat wound dry prior to applying new dressing. Wound #6 Left Toe Second o Cleanse wound with mild soap and water o May Shower, gently pat wound dry prior to applying new dressing. Anesthetic Wound #1 Right Toe Great o Topical Lidocaine 4% cream applied to wound bed prior to debridement Wound #2 Right Toe Second o Topical Lidocaine 4% cream applied to wound bed prior to debridement Wound #5 Left Toe Great o Topical Lidocaine 4% cream applied to wound bed prior to debridement Wound #6 Left Toe Second o Topical Lidocaine 4% cream applied to wound bed prior to debridement Skin Barriers/Peri-Wound Care Wound #1 Right Toe Great o Antifungal cream - on and between toes then AqAg over areas Brian, Josearmando E.  ([id number]) Wound #2 Right Toe Second o Antifungal cream - on and between toes then AqAg over areas Wound #5 Left Toe Great o Antifungal cream - on and between toes then AqAg over areas Wound #6 Left Toe Second o Antifungal cream - on and between toes then AqAg over areas Primary Wound Dressing Wound #1 Right Toe Great o Aquacel Ag Wound #2 Right Toe Second o Aquacel Ag Wound #5 Left Toe Great o Aquacel Ag Wound #6 Left Toe Second o Aquacel Ag Secondary Dressing Wound #1 Right Toe Great o ABD pad o Gauze and Kerlix/Conform Wound #2 Right Toe Second o ABD pad o Gauze and Kerlix/Conform Wound #5 Left Toe Great o ABD pad o Gauze and Kerlix/Conform Wound #6 Left Toe Second o ABD pad o Gauze and Kerlix/Conform Dressing Change Frequency Wound #1 Right Toe Great o Change dressing every day. Wound #2 Right Toe Second o Change dressing every day. Wound #5 Left [address] Elan E. ([id number]) o Change dressing every day. Wound #6 Left Toe Second o Change dressing every day. Follow-up Appointments Wound #1 Right Toe Great o Return Appointment in 1 week. Wound #2 Right Toe Second o Return Appointment in 1 week. Wound #5 Left Toe Great o Return Appointment in 1 week. Wound #6 Left Toe Second o Return Appointment in 1 week. Edema Control Wound #1 Right Toe Great o Patient to wear own compression stockings o Elevate legs to the level of the heart and pump ankles as often as possible Wound #2 Right Toe Second o Patient to wear own compression stockings o Elevate legs to the level of the heart and pump ankles as often as possible Wound #5 Left Toe Great o Patient to wear own compression stockings o Elevate legs to the level of the heart and pump ankles as often as possible Wound #6 Left Toe Second o Patient to wear own compression stockings o Elevate legs to the level of the heart and pump ankles as often  as possible Additional Orders / Instructions Wound #1 Right Toe Epifania Gore  o Stop Smoking - Please continue to stay away from alcohol o Other: - Please add vitamin A, vitamin C, and Zinc supplements to your diet Wound #2 Right Toe Second o Stop Smoking - Please continue to stay away from alcohol o Other: - Please add vitamin A, vitamin C, and Zinc supplements to your diet Wound #5 Left Toe Great o Stop Smoking - Please continue to stay away from alcohol o Other: - Please add vitamin A, vitamin C, and Zinc supplements to your diet Schonberg, Jonatan E. ([id number]) Wound #6 Left Toe Second o Stop Smoking - Please continue to stay away from alcohol o Other: - Please add vitamin A, vitamin C, and Zinc supplements to your diet Electronic Signature(s) Signed: 12/17/2016 4:10:50 PM By: Christin Fudge MD, FACS Signed: 12/17/2016 4:57:35 PM By: Montey Hora Entered By: Montey Hora on 12/17/2016 09:35:10 Tatum, [name] ([id number]) -------------------------------------------------------------------------------- Problem List Details Patient Name: JARAE, PANAS 12/17/2016 9:15 Date of Service: AM Medical Record [id number] Number: Patient Account Number: [id number] Date of Birth/Sex: [birth year] (66 y.o. Male) Treating RN: Elyn Peers, Other Clinician: Primary Care Provider: Olivia Mackie Treating [place]ad [place], Cherie Ouch, Provider/Extender: Referring Provider: Harless Nakayama in Treatment: 12 Active Problems ICD-10 Encounter Code Description Active Date Diagnosis E11.621 Type 2 diabetes mellitus with foot ulcer 09/21/2016 Yes I87.313 Chronic venous hypertension (idiopathic) with ulcer of 09/21/2016 Yes bilateral lower extremity I89.0 Lymphedema, not elsewhere classified 09/21/2016 Yes L97.522 Non-pressure chronic ulcer of other part of left foot with fat 09/21/2016 Yes layer exposed L97.512 Non-pressure chronic ulcer of other part of right foot with 09/21/2016  Yes fat layer exposed F17.218 Nicotine dependence, cigarettes, 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 12/17/2016 9:15 Date of Service: AM Medical Record [id number] Number: Patient Account Number: [id number] Date of Birth/Sex: October 27, 1950 (66 y.o. Male) Treating RN: Elyn Peers, Other Clinician: Primary Care Provider: Olivia Mackie Treating Joel Cowin, Cherie Ouch, Provider/Extender: Referring Provider: Harless Nakayama in Treatment: 12 Subjective Chief Complaint Information obtained from Patient Patients presents for treatment of an open diabetic ulcer to both feet History of Present Illness (HPI) The following HPI elements were documented for the patient's wound: Location: bilateral feet ulceration on the toes Quality: Patient reports experiencing a dull pain to affected area(s). Severity: Patient states wound are getting better Duration: Patient has had the wound for > 3 months prior to seeking treatment at the wound center Timing: Pain in wound is constant (hurts all the time) Context: The wound would happen gradually Modifying Factors: Other treatment(s) tried include:treatment for lymphedema and is seen by the podiatrist Dr. Caryl Comes Associated Signs and Symptoms: Patient reports having increase swelling. 66 year old patient here to see as for bilateral feet ulceration to on his left first and second toe and 2 on his right first and second toe, which she's had for about 4 months. 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 Donella Stade. The patient is also undergoing workup by medical oncology for a intra- abdominal lymphadenopathy. Past medical history significant for CHF, diabetes mellitus, hypertension, varicose veins with lymphedema and squamous cell cancer of the skin of  the buttocks. He is also status post appendectomy, inguinal lymph node biopsy, rectal biopsy and rectal examination under anesthesia. he currently smokes cigarettes about half packet a day. In March of this year he was seen by Dr. Hortencia Pilar, for evaluation of bilateral varicose veins and besides wearing compression stockings he had recommended laser ablation of the right and left great saphenous veins to eleviate the symptoms and complications of severe superficial venous reflux disease. He also recommended lymphedema pumps for better control of his lymphedema. The patient recently has had on 08/23/2016, right greater saphenous vein ablation with the laser energy Earlier lower extremity venous reflux examination done on 05/08/2016 showed no DVT or SVT both lower legs but incompetence of bilateral great saphenous veins was present. A lower arterial study was also done and there was no significant right lower and left lower extremity problems based on a normal toe brachial index bilaterally and the ABI was 1.21 the left and 1.23 on the Rozelle, Kamori E. ([id number]) right. His post ablation venous duplex examination showed successful ablation of the right GS vein with thrombus formation 2 below the right saphenofemoral junction. The deep system was patent without evidence of thrombosis and this was done on 08/30/2016. 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 Addendum: regarding his x-rays done  today and x-ray of the left foot -- IMPRESSION: No objective evidence of osteomyelitis. There are soft tissue changes which may reflect cellulitis. X-ray of the right foot -- IMPRESSION:Findings compatible with cellulitis of the toes. No objective evidence of osteomyelitis is observed. 10/01/16 on evaluation today patient's wounds appeared to be doing some better. I did review the x-rays as well which showed no evidence of osteomyelitis although there was evidence on x-ray of cellulitis. He fortunately is not having any discomfort although he continues to have some swelling. He does not remember being on any antibiotics recently. 10/15/16 on evaluation today patient's wounds overall appear to be doing better although he does have a new location noted on the left foot. Fortunately he is not having significant pain. It almost has the appearance that something is rubbing on the end of his toes but he wears the open toe shoes and according to what he is telling me never wears anything that would rub on his foot. There is no evidence of infection and specifically no evidence of a fungal infection 10/22/16 On evaluation today patient's wounds appeared to be doing better compared to last week in regard to his bilateral lower extremities. Fortunately I happy with how things are progressing although he still has ulcers I feel like that he is improving and appropriate manner. 11/12/16 on evaluation today patient appears to be doing well in regard to his bilateral feet and the respective wounds. We have been using surrounding her dressings along with an antifungal cream which seems to be doing very well. He has no bilateral dysfunction noticed that the rituals are weight loss at this point. He also has no nausea or vomiting a note purulent discharge. He did see Vein and vascular today and he tells me that they told him he could have surgery for his venous stasis but they did not feel like it was worth it in  his words. Fortunately patient's wounds do appear to be getting sneakily better. 11/26/2016 -- he says he is going to have some surgery during this week at [facility] for  possibly a colon resection. Objective Constitutional Pulse regular. Respirations normal and unlabored. Afebrile. Panico, Doyt E. ([id number]) Vitals Time Taken: 9:15 AM, Height: 69 in, Weight: 168 lbs, BMI: 24.8, Temperature: 98.2 F, Pulse: 58 bpm, Respiratory Rate: 16 breaths/min, Blood Pressure: 163/79 mmHg. Eyes Nonicteric. Reactive to light. Ears, Nose, Mouth, and Throat Lips, teeth, and gums WNL.Marland Kitchen Moist mucosa without lesions. Neck supple and nontender. No palpable supraclavicular or cervical adenopathy. Normal sized without goiter. Respiratory WNL. No retractions.. Cardiovascular Pedal Pulses WNL. No clubbing, cyanosis or edema. Lymphatic No adneopathy. No adenopathy. No adenopathy. Musculoskeletal Adexa without tenderness or enlargement.. Digits and nails w/o clubbing, cyanosis, infection, petechiae, ischemia, or inflammatory conditions.Marland Kitchen Psychiatric Judgement and insight Intact.. No evidence of depression, anxiety, or agitation.. General Notes: he still has an element of fungal infection but other than that his wounds are looking very clean and there is no evidence of any superficial or deep or debris which need sharp debridement Integumentary (Hair, Skin) No suspicious lesions. No crepitus or fluctuance. No peri-wound warmth or erythema. No masses.. Wound #1 status is Open. Original cause of wound was Gradually Appeared. The wound is located on the Right Toe Great. The wound measures 1.5cm length x 5.8cm width x 0.2cm depth; 6.833cm^2 area and 1.367cm^3 volume. There is Fat Layer (Subcutaneous Tissue) Exposed exposed. There is no tunneling or undermining noted. There is a large amount of serosanguineous drainage noted. Foul odor after cleansing was noted. The wound margin is flat and intact. There is  large (67-100%) pink granulation within the wound bed. There is no necrotic tissue within the wound bed. The periwound skin appearance exhibited: Excoriation, Maceration. The periwound skin appearance did not exhibit: Callus, Crepitus, Induration, Rash, Scarring, Dry/Scaly, Atrophie Blanche, Cyanosis, Ecchymosis, Hemosiderin Staining, Mottled, Pallor, Rubor, Erythema. Periwound temperature was noted as No Abnormality. Wound #2 status is Open. Original cause of wound was Gradually Appeared. The wound is located on the Right Toe Second. The wound measures 1.3cm length x 1.2cm width x 0.1cm depth; 1.225cm^2 area and 0.123cm^3 volume. There is Fat Layer (Subcutaneous Tissue) Exposed exposed. There is no tunneling or undermining noted. There is a large amount of serosanguineous drainage noted. Foul odor after cleansing was noted. The wound margin is flat and intact. There is large (67-100%) pink granulation within the wound Glazebrook, Inaki E. ([id number]) bed. There is no necrotic tissue within the wound bed. The periwound skin appearance exhibited: Excoriation, Maceration. The periwound skin appearance did not exhibit: Callus, Crepitus, Induration, Rash, Scarring, Dry/Scaly, Atrophie Blanche, Cyanosis, Ecchymosis, Hemosiderin Staining, Mottled, Pallor, Rubor, Erythema. Periwound temperature was noted as No Abnormality. Wound #4 status is Healed - Epithelialized. Original cause of wound was Gradually Appeared. The wound is located on the Right,Dorsal Foot. The wound measures 0cm length x 0cm width x 0cm depth; 0cm^2 area and 0cm^3 volume. Wound #5 status is Open. Original cause of wound was Gradually Appeared. The wound is located on the Left Toe Great. The wound measures 0.5cm length x 0.7cm width x 0.1cm depth; 0.275cm^2 area and 0.027cm^3 volume. There is Fat Layer (Subcutaneous Tissue) Exposed exposed. There is no tunneling or undermining noted. There is a large amount of serous drainage noted. Foul  odor after cleansing was noted. The wound margin is flat and intact. There is large (67-100%) pink granulation within the wound bed. There is no necrotic tissue within the wound bed. The periwound skin appearance exhibited: Excoriation. The periwound skin appearance did not exhibit: Callus, Crepitus, Induration, Rash, Scarring, Dry/Scaly, Maceration, Atrophie Blanche, Cyanosis,  Ecchymosis, Hemosiderin Staining, Mottled, Pallor, Rubor, Erythema. Periwound temperature was noted as No Abnormality. Wound #6 status is Open. Original cause of wound was Gradually Appeared. The wound is located on the Left Toe Second. The wound measures 0.1cm length x 0.1cm width x 0.1cm depth; 0.008cm^2 area and 0.001cm^3 volume. There is Fat Layer (Subcutaneous Tissue) Exposed exposed. There is no tunneling or undermining noted. There is a large amount of serous drainage noted. Foul odor after cleansing was noted. The wound margin is flat and intact. There is large (67-100%) pink granulation within the wound bed. There is no necrotic tissue within the wound bed. The periwound skin appearance exhibited: Excoriation, Maceration. The periwound skin appearance did not exhibit: Callus, Crepitus, Induration, Rash, Scarring, Dry/Scaly, Atrophie Blanche, Cyanosis, Ecchymosis, Hemosiderin Staining, Mottled, Pallor, Rubor, Erythema. Periwound temperature was noted as No Abnormality. Wound #7 status is Healed - Epithelialized. Original cause of wound was Gradually Appeared. The wound is located on the Left Toe Third. The wound measures 0cm length x 0cm width x 0cm depth; 0cm^2 area and 0cm^3 volume. 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 ([id number]) Plan Wound Cleansing: Wound #1 Right Toe Great: Cleanse wound with mild soap and water May Shower, gently pat wound dry prior to applying new dressing. Wound #2 Right Toe Second: Cleanse wound with mild soap and water May Shower, gently pat wound dry prior to applying new dressing. Wound #5 Left Toe Great: Cleanse wound with mild soap and water May Shower, gently pat wound dry prior to applying new dressing. Wound #6 Left Toe Second: Cleanse wound with mild soap and water May Shower, gently pat wound dry prior to applying new dressing. Anesthetic: Wound #1 Right Toe Great: Topical Lidocaine 4% cream applied to wound bed prior to debridement Wound #2 Right Toe Second: Topical Lidocaine 4% cream applied to wound bed prior to debridement Wound #5 Left Toe Great: Topical Lidocaine 4% cream applied to wound bed prior to debridement Wound #6 Left Toe Second: Topical Lidocaine 4% cream applied to wound bed prior to debridement Skin Barriers/Peri-Wound Care: Wound #1 Right Toe Great: Antifungal cream - on and between toes then AqAg over areas Wound #2 Right Toe Second: Antifungal cream - on and between toes then AqAg over areas Wound #5 Left Toe Great: Antifungal cream - on and between toes then AqAg over areas Wound #6 Left Toe Second: Antifungal cream - on and between toes then AqAg over areas Primary Wound Dressing: Wound #1 Right Toe Great: Aquacel Ag Wound #2 Right Toe Second: Aquacel Ag Wound #5 Left Toe Great: Aquacel Ag Wound #6 Left Toe Second: Aquacel Ag Secondary Dressing: Wound #1 Right Toe Great: ABD pad Gauze and Kerlix/Conform Wound #2 Right Toe Second: Lemaire, Keeghan E. ([id number]) ABD pad Gauze and Kerlix/Conform Wound #5  Left Toe Great: ABD pad Gauze and Kerlix/Conform Wound #6 Left Toe Second: ABD pad Gauze and Kerlix/Conform Dressing Change Frequency: Wound #1 Right Toe Great: Change dressing every day. Wound #2 Right Toe Second: Change dressing every day. Wound #5 Left Toe Great: Change dressing every  day. Wound #6 Left Toe Second: Change dressing every day. Follow-up Appointments: Wound #1 Right Toe Great: Return Appointment in 1 week. Wound #2 Right Toe Second: Return Appointment in 1 week. Wound #5 Left Toe Great: Return Appointment in 1 week. Wound #6 Left Toe Second: Return Appointment in 1 week. Edema Control: Wound #1 Right Toe Great: Patient to wear own compression stockings Elevate legs to the level of the heart and pump ankles as often as possible Wound #2 Right Toe Second: Patient to wear own compression stockings Elevate legs to the level of the heart and pump ankles as often as possible Wound #5 Left Toe Great: Patient to wear own compression stockings Elevate legs to the level of the heart and pump ankles as often as possible Wound #6 Left Toe Second: Patient to wear own compression stockings Elevate legs to the level of the heart and pump ankles as often as possible Additional Orders / Instructions: Wound #1 Right Toe Great: Stop Smoking - Please continue to stay away from alcohol Other: - Please add vitamin A, vitamin C, and Zinc supplements to your diet Wound #2 Right Toe Second: Stop Smoking - Please continue to stay away from alcohol Other: - Please add vitamin A, vitamin C, and Zinc supplements to your diet Wound #5 Left Toe Great: Stop Smoking - Please continue to stay away from alcohol Other: - Please add vitamin A, vitamin C, and Zinc supplements to your diet Wound #6 Left Toe Second: Stop Smoking - Please continue to stay away from alcohol [place], Brody E. ([id number]) Other: - Please add vitamin A, vitamin C, and Zinc supplements to your diet after review  today, I have recommended: 1. Silver alginate to be applied over his wounds, and change daily after washing with soap and water. 2. Lotrisone ointment to be applied liberally to his toes and interdigital areas 3. Good control of his diabetes mellitus 4. the need to completely give up smoking discussed with him in great detail 5. regular visits the wound center Electronic Signature(s) Signed: 12/17/2016 4:10:50 PM By: Christin Fudge MD, FACS Entered By: Christin Fudge on 12/17/2016 09:40:42 [place]co[place], [name] ([id number]) -------------------------------------------------------------------------------- SuperBill Details Patient Name: Bistline, Sherif E. Date of Service: 12/17/2016 Medical Record Number: [id number] Patient Account Number: [id number] Date of Birth/Sex: 11/09/1950 (66 y.o. 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 F17.218 Nicotine dependence, cigarettes, 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 abuse with unspecified alcohol-induced disorder Facility Procedures CPT4 Code: [id number] Description: 37628 - WOUND CARE VISIT-LEV 5 EST PT Modifier: Quantity: 1 Physician Procedures CPT4: Description Modifier Quantity Code [id number] - WC PHYS LEVEL 3 - EST PT 1 ICD-10 Description Diagnosis E11.621 Type 2 diabetes mellitus with foot ulcer I87.313 Chronic venous hypertension (idiopathic) with ulcer of bilateral lower  extremity  L97.522 Non-pressure chronic ulcer of other part of left foot with fat layer exposed L97.512 Non-pressure chronic ulcer of other part of right foot with fat layer exposed Electronic Signature(s) Signed: 12/17/2016 4:10:50 PM By: Christin Fudge MD, FACS Signed: 12/17/2016 4:57:35 PM By: Montey Hora Entered By: Marjory Lies,  Joanna on 12/17/2016 11:57:54

## 2016-12-24 ENCOUNTER — Encounter: Payer: Medicare HMO | Admitting: Surgery

## 2016-12-24 DIAGNOSIS — E11621 Type 2 diabetes mellitus with foot ulcer: Secondary | ICD-10-CM | POA: Diagnosis not present

## 2016-12-24 NOTE — Progress Notes (Signed)
[name] ([id number]) Visit Report for 12/24/2016 Arrival Information Details Patient Name: [name], [name] 12/24/2016 1:15 Date of Service: PM Medical Record [id number] Number: Patient Account Number: [id number] Date of Birth/Sex: February 27, 1951 (66 y.o. Male) Treating RN: Anastasio Champion, Other Clinician: Primary Care Thurlow Gallaga: Olivia Mackie Treating Britto, Cherie Ouch, Elyn Krogh/Extender: Referring Phillis Thackeray: Harless Nakayama in Treatment: 13 Visit Information History Since Last Visit All ordered tests and consults were completed: No Patient Arrived: Ambulatory Added or deleted any medications: No Arrival Time: 13:19 Any new allergies or adverse reactions: No Accompanied By: brother in law Had a fall or experienced change in No activities of daily living that may affect Transfer Assistance: None risk of falls: Patient Identification Verified: Yes Signs or symptoms of abuse/neglect since last No Secondary Verification Process Yes visito Completed: Hospitalized since last visit: No Patient Requires Transmission-Based No Has Dressing in Place as Prescribed: Yes Precautions: Pain Present Now: No Patient Has Alerts: Yes Patient Alerts: DMII Electronic Signature(s) Signed: 12/24/2016 3:36:38 PM By: Alric Quan Entered By: Alric Quan on 12/24/2016 13:21:50 Ruggles, [name] ([id number]) -------------------------------------------------------------------------------- Clinic Level of Care Assessment Details Patient Name: [name] 12/24/2016 1:15 Date of Service: PM Medical Record [id number] Number: Patient Account Number: [id number] Date of Birth/Sex: 1950-05-29 (66 y.o. Male) Treating RN: Anastasio Champion, Other Clinician: Primary Care Wyley Hack: Olivia Mackie Treating Britto, Cherie Ouch, Devrin Monforte/Extender: Referring Thomos Domine: Harless Nakayama in Treatment: 13 Clinic Level of Care Assessment Items TOOL 4 Quantity Score X -  Use when only an EandM is performed on FOLLOW-UP visit 1 0 ASSESSMENTS - Nursing Assessment / Reassessment X - Reassessment of Co-morbidities (includes updates in patient status) 1 10 X - Reassessment of Adherence to Treatment Plan 1 5 ASSESSMENTS - Wound and Skin Assessment / Reassessment []  - Simple Wound Assessment / Reassessment - one wound 0 X - Complex Wound Assessment / Reassessment - multiple wounds 4 5 []  - Dermatologic / Skin Assessment (not related to wound area) 0 ASSESSMENTS - Focused Assessment []  - Circumferential Edema Measurements - multi extremities 0 []  - Nutritional Assessment / Counseling / Intervention 0 []  - Lower Extremity Assessment (monofilament, tuning fork, pulses) 0 []  - Peripheral Arterial Disease Assessment (using hand held doppler) 0 ASSESSMENTS - Ostomy and/or Continence Assessment and Care []  - Incontinence Assessment and Management 0 []  - Ostomy Care Assessment and Management (repouching, etc.) 0 PROCESS - Coordination of Care X - Simple Patient / Family Education for ongoing care 1 15 []  - Complex (extensive) Patient / Family Education for ongoing care 0 []  - Staff obtains Consents, Records, Test Results / Process Orders 0 Surber, Ancil E. ([id number]) []  - Staff telephones HHA, Nursing Homes / Clarify orders / etc 0 []  - Routine Transfer to another Facility (non-emergent condition) 0 []  - Routine Hospital Admission (non-emergent condition) 0 []  - New Admissions / Biomedical engineer / Ordering NPWT, Apligraf, etc. 0 []  - Emergency Hospital Admission (emergent condition) 0 X - Simple Discharge Coordination 1 10 []  - Complex (extensive) Discharge Coordination 0 PROCESS - Special Needs []  - Pediatric / Minor Patient Management 0 []  - Isolation Patient Management 0 []  - Hearing / Language / Visual special needs 0 []  - Assessment of Community assistance (transportation, D/C planning, etc.) 0 []  - Additional assistance / Altered mentation 0 []  -  Support Surface(s) Assessment (bed, cushion, seat, etc.) 0 INTERVENTIONS - Wound Cleansing / Measurement []  - Simple Wound Cleansing - one wound 0 X - Complex Wound Cleansing - multiple wounds 4 5 X -  Wound Imaging (photographs - any number of wounds) 1 5 []  - Wound Tracing (instead of photographs) 0 []  - Simple Wound Measurement - one wound 0 X - Complex Wound Measurement - multiple wounds 4 5 INTERVENTIONS - Wound Dressings []  - Small Wound Dressing one or multiple wounds 0 X - Medium Wound Dressing one or multiple wounds 2 15 []  - Large Wound Dressing one or multiple wounds 0 X - Application of Medications - topical 1 5 []  - Application of Medications - injection 0 Meiner, Artemus E. ([id number]) INTERVENTIONS - Miscellaneous []  - External ear exam 0 []  - Specimen Collection (cultures, biopsies, blood, body fluids, etc.) 0 []  - Specimen(s) / Culture(s) sent or taken to Lab for analysis 0 []  - Patient Transfer (multiple staff / Harrel Lemon Lift / Similar devices) 0 []  - Simple Staple / Suture removal (25 or less) 0 []  - Complex Staple / Suture removal (26 or more) 0 []  - Hypo / Hyperglycemic Management (close monitor of Blood Glucose) 0 []  - Ankle / Brachial Index (ABI) - do not check if billed separately 0 X - Vital Signs 1 5 Has the patient been seen at the hospital within the last three years: Yes Total Score: 145 Level Of Care: New/Established - Level 4 Electronic Signature(s) Signed: 12/24/2016 3:36:38 PM By: Alric Quan Entered By: Alric Quan on 12/24/2016 15:12:51 [name], [name] ([id number]) -------------------------------------------------------------------------------- Encounter Discharge Information Details Patient Name: [name] 12/24/2016 1:15 Date of Service: PM Medical Record [id number] Number: Patient Account Number: [id number] Date of Birth/Sex: 10-05-1950 (66 y.o. Male) Treating RN: Anastasio Champion, Other Clinician: Primary Care  Dorien Bessent: Olivia Mackie Treating Britto, Cherie Ouch, Babette Stum/Extender: Referring Juwann Sherk: Harless Nakayama in Treatment: 13 Encounter Discharge Information Items Discharge Pain Level: 0 Discharge Condition: Stable Ambulatory Status: Ambulatory Discharge Destination: Home Transportation: Private Auto Accompanied By: brother in law Schedule Follow-up Appointment: Yes Medication Reconciliation completed and provided to Patient/Care No Peggie Hornak: Provided on Clinical Summary of Care: 12/24/2016 Form Type Recipient Paper Patient ED Electronic Signature(s) Signed: 12/24/2016 3:36:38 PM By: Alric Quan Entered By: Alric Quan on 12/24/2016 13:49:54 Rought, [name] ([id number]) -------------------------------------------------------------------------------- Lower Extremity Assessment Details Patient Name: [name], STAMPER 12/24/2016 1:15 Date of Service: PM Medical Record [id number] Number: Patient Account Number: [id number] Date of Birth/Sex: 1950-11-28 (66 y.o. Male) Treating RN: Anastasio Champion, Other Clinician: Primary Care Naquisha Whitehair: Olivia Mackie Treating Britto, Cherie Ouch, Precilla Purnell/Extender: Referring Jarek Longton: Harless Nakayama in Treatment: 13 Vascular Assessment Pulses: Dorsalis Pedis Palpable: [Left:Yes] [Right:Yes] Posterior Tibial Extremity colors, hair growth, and conditions: Extremity Color: [Left:Hyperpigmented] [Right:Hyperpigmented] Temperature of Extremity: [Left:Warm] [Right:Warm] Capillary Refill: [Left:< 3 seconds] [Right:< 3 seconds] Toe Nail Assessment Left: Right: Thick: Yes Yes Discolored: Yes Yes Deformed: Yes Yes Improper Length and Hygiene: Yes Yes Electronic Signature(s) Signed: 12/24/2016 3:36:38 PM By: Alric Quan Entered By: Alric Quan on 12/24/2016 13:29:34 Legaspi, [name] ([id number]) -------------------------------------------------------------------------------- Multi Wound Chart Details Patient  Name: [name] 12/24/2016 1:15 Date of Service: PM Medical Record [id number] Number: Patient Account Number: [id number] Date of Birth/Sex: 11-04-1950 (66 y.o. Male) Treating RN: Anastasio Champion, Other Clinician: Primary Care Truly Stankiewicz: Olivia Mackie Treating Britto, Cherie Ouch, Tomoki Lucken/Extender: Referring Shanon Becvar: Harless Nakayama in Treatment: 13 Vital Signs Height(in): 69 Pulse(bpm): 69 Weight(lbs): 168 Blood Pressure 145/79 (mmHg): Body Mass Index(BMI): 25 Temperature(F): 98.2 Respiratory Rate 16 (breaths/min): Photos: [1:No Photos] [2:No Photos] [5:No Photos] Wound Location: [1:Right Toe Great] [2:Right Toe Second] [5:Left Toe Great] Wounding Event: [1:Gradually Appeared] [2:Gradually Appeared] [5:Gradually Appeared] Primary Etiology: [1:Diabetic Wound/Ulcer of the  Lower Extremity] [2:Diabetic Wound/Ulcer of the Lower Extremity] [5:Diabetic Wound/Ulcer of the Lower Extremity] Comorbid History: [1:Anemia, Lymphedema, Congestive Heart Failure, Hypertension, Peripheral Venous Disease, Type II Diabetes, Neuropathy] [2:Anemia, Lymphedema, Congestive Heart Failure, Hypertension, Peripheral Venous Disease, Type II Diabetes,  Neuropathy] [5:Anemia, Lymphedema, Congestive Heart Failure, Hypertension, Peripheral Venous Disease, Type II Diabetes, Neuropathy] Date Acquired: [1:06/11/2016] [2:06/11/2016] [5:06/11/2016] Weeks of Treatment: [1:13] [2:13] [5:13] Wound Status: [1:Open] [2:Open] [5:Open] Pending Amputation on Yes [2:Yes] [5:Yes] Presentation: Measurements L x W x D 1x6x0.2 [2:1.2x1.3x0.1] [5:0.5x0.7x0.1] (cm) Area (cm) : [1:4.712] [2:1.225] [5:0.275] Volume (cm) : [1:0.942] [2:0.123] [5:0.027] % Reduction in Area: [1:90.00%] [2:60.10%] [5:98.10%] % Reduction in Volume: 80.00% [2:59.90%] [5:98.10%] Classification: [1:Grade 1] [2:Grade 1] [5:Grade 1] Exudate Amount: [1:Large] [2:Large] [5:Large] Exudate Type: [1:Serosanguineous] [2:Serosanguineous]  [5:Serous] Exudate Color: [1:red, brown] [2:red, brown] [5:amber] Foul Odor After [1:Yes] [2:Yes] [5:Yes] Cleansing: Petrich, Makel E. ([id number]) Odor Anticipated Due to No No No Product Use: Wound Margin: Flat and Intact Flat and Intact Flat and Intact Granulation Amount: Medium (34-66%) Large (67-100%) Medium (34-66%) Granulation Quality: Pink Pink Pink Necrotic Amount: Medium (34-66%) None Present (0%) Medium (34-66%) Exposed Structures: Fat Layer (Subcutaneous Fat Layer (Subcutaneous Fat Layer (Subcutaneous Tissue) Exposed: Yes Tissue) Exposed: Yes Tissue) Exposed: Yes Fascia: No Fascia: No Fascia: No Tendon: No Tendon: No Tendon: No Muscle: No Muscle: No Muscle: No Joint: No Joint: No Joint: No Bone: No Bone: No Bone: No Epithelialization: Large (67-100%) Small (1-33%) Small (1-33%) Periwound Skin Texture: Excoriation: Yes Excoriation: Yes Excoriation: Yes Induration: No Induration: No Induration: No Callus: No Callus: No Callus: No Crepitus: No Crepitus: No Crepitus: No Rash: No Rash: No Rash: No Scarring: No Scarring: No Scarring: No Periwound Skin Maceration: Yes Maceration: Yes Maceration: No Moisture: Dry/Scaly: No Dry/Scaly: No Dry/Scaly: No Periwound Skin Color: Atrophie Blanche: No Atrophie Blanche: No Atrophie Blanche: No Cyanosis: No Cyanosis: No Cyanosis: No Ecchymosis: No Ecchymosis: No Ecchymosis: No Erythema: No Erythema: No Erythema: No Hemosiderin Staining: No Hemosiderin Staining: No Hemosiderin Staining: No Mottled: No Mottled: No Mottled: No Pallor: No Pallor: No Pallor: No Rubor: No Rubor: No Rubor: No Temperature: No Abnormality No Abnormality No Abnormality Tenderness on No No No Palpation: Wound Preparation: Ulcer Cleansing: Ulcer Cleansing: Ulcer Cleansing: Rinsed/Irrigated with Rinsed/Irrigated with Rinsed/Irrigated with Saline Saline Saline Topical Anesthetic Topical Anesthetic Topical  Anesthetic Applied: None Applied: None Applied: None Wound Number: 6 N/A N/A Photos: No Photos N/A N/A Wound Location: Left Toe Second N/A N/A Wounding Event: Gradually Appeared N/A N/A Primary Etiology: Diabetic Wound/Ulcer of N/A N/A the Lower Extremity Comorbid History: Anemia, Lymphedema, N/A N/A Congestive Heart Failure, Hypertension, Peripheral Kempner, Mekhi E. ([id number]) Venous Disease, Type II Diabetes, Neuropathy Date Acquired: 06/11/2016 N/A N/A Weeks of Treatment: 13 N/A N/A Wound Status: Open N/A N/A Pending Amputation on Yes N/A N/A Presentation: Measurements L x W x D 0.5x0.6x0.1 N/A N/A (cm) Area (cm) : 0.236 N/A N/A Volume (cm) : 0.024 N/A N/A % Reduction in Area: 89.00% N/A N/A % Reduction in Volume: 88.80% N/A N/A Classification: Grade 1 N/A N/A Exudate Amount: Large N/A N/A Exudate Type: Serous N/A N/A Exudate Color: amber N/A N/A Foul Odor After Yes N/A N/A Cleansing: Odor Anticipated Due to No N/A N/A Product Use: Wound Margin: Flat and Intact N/A N/A Granulation Amount: Medium (34-66%) N/A N/A Granulation Quality: Pink N/A N/A Necrotic Amount: Medium (34-66%) N/A N/A Exposed Structures: Fat Layer (Subcutaneous N/A N/A Tissue) Exposed: Yes Fascia: No Tendon: No Muscle: No Joint: No Bone: No Epithelialization: Small (1-33%)  N/A N/A Periwound Skin Texture: Excoriation: Yes N/A N/A Induration: No Callus: No Crepitus: No Rash: No Scarring: No Periwound Skin Maceration: Yes N/A N/A Moisture: Dry/Scaly: No Periwound Skin Color: Atrophie Blanche: No N/A N/A Cyanosis: No Ecchymosis: No Erythema: No Hemosiderin Staining: No Mottled: No Pallor: No Rubor: No Temperature: No Abnormality N/A N/A Najarro, Guadalupe E. ([id number]) Tenderness on No N/A N/A Palpation: Wound Preparation: Ulcer Cleansing: N/A N/A Rinsed/Irrigated with Saline Topical Anesthetic Applied: None Treatment Notes Wound #1 (Right Toe Great) 1. Cleansed with: Clean  wound with Normal Saline 2. Anesthetic Topical Lidocaine 4% cream to wound bed prior to debridement 3. Peri-wound Care: Antifungal cream 4. Dressing Applied: Aquacel Ag 5. Secondary Dressing Applied Dry Gauze Kerlix/Conform 7. Secured with Tape Notes netting Wound #2 (Right Toe Second) 1. Cleansed with: Clean wound with Normal Saline 2. Anesthetic Topical Lidocaine 4% cream to wound bed prior to debridement 3. Peri-wound Care: Antifungal cream 4. Dressing Applied: Aquacel Ag 5. Secondary Dressing Applied Dry Gauze Kerlix/Conform 7. Secured with Tape Notes netting Wound #5 (Left Toe Great) 1. Cleansed with: Bun, Rhys E. ([id number]) Clean wound with Normal Saline 2. Anesthetic Topical Lidocaine 4% cream to wound bed prior to debridement 3. Peri-wound Care: Antifungal cream 4. Dressing Applied: Aquacel Ag 5. Secondary Dressing Applied Dry Gauze Kerlix/Conform 7. Secured with Tape Notes netting Wound #6 (Left Toe Second) 1. Cleansed with: Clean wound with Normal Saline 2. Anesthetic Topical Lidocaine 4% cream to wound bed prior to debridement 3. Peri-wound Care: Antifungal cream 4. Dressing Applied: Aquacel Ag 5. Secondary Dressing Applied Dry Gauze Kerlix/Conform 7. Secured with Tape Notes netting Electronic Signature(s) Signed: 12/24/2016 3:43:44 PM By: Christin Fudge MD, FACS Entered By: Christin Fudge on 12/24/2016 14:11:08 Fenoglio, [name] ([id number]) -------------------------------------------------------------------------------- Multi-Disciplinary Care Plan Details Patient Name: DAJION, BICKFORD 12/24/2016 1:15 Date of Service: PM Medical Record [id number] Number: Patient Account Number: [id number] Date of Birth/Sex: [birth year] (66 y.o. Male) Treating RN: Anastasio Champion, Other Clinician: Primary Care Montana Fassnacht: Olivia Mackie Treating Britto, Cherie Ouch, Elbony Mcclimans/Extender: Referring Twisha Vanpelt: Harless Nakayama in Treatment:  13 Active Inactive ` Abuse / Safety / Falls / Self Care Management Nursing Diagnoses: Potential for falls Goals: Patient will remain injury free related to falls Date Initiated: 09/21/2016 Target Resolution Date: 11/30/2016 Goal Status: Active Interventions: Assess fall risk on admission and as needed Notes: ` Nutrition Nursing Diagnoses: Potential for alteratiion in Nutrition/Potential for imbalanced nutrition Goals: Patient/caregiver agrees to and verbalizes understanding of need to use nutritional supplements and/or vitamins as prescribed Date Initiated: 09/21/2016 Target Resolution Date: 11/30/2016 Goal Status: Active Interventions: Assess patient nutrition upon admission and as needed per policy Notes: ` Orientation to the [facility]. ([id number]) Nursing Diagnoses: Knowledge deficit related to the wound healing center program Goals: Patient/caregiver will verbalize understanding of the [facility] Date Initiated: 09/21/2016 Target Resolution Date: 11/30/2016 Goal Status: Active Interventions: Provide education on orientation to the wound center Notes: ` Wound/Skin Impairment Nursing Diagnoses: Knowledge deficit related to smoking impact on wound healing Goals: Ulcer/skin breakdown will have a volume reduction of 30% by week 4 Date Initiated: 09/21/2016 Target Resolution Date: 11/30/2016 Goal Status: Active Ulcer/skin breakdown will have a volume reduction of 50% by week 8 Date Initiated: 09/21/2016 Target Resolution Date: 11/30/2016 Goal Status: Active Ulcer/skin breakdown will have a volume reduction of 80% by week 12 Date Initiated: 09/21/2016 Target Resolution Date: 11/30/2016 Goal Status: Active Ulcer/skin breakdown will heal within 14 weeks Date Initiated: 09/21/2016 Target Resolution Date: 11/30/2016  Goal Status: Active Interventions: Assess patient/caregiver ability to obtain necessary supplies Assess  patient/caregiver ability to perform ulcer/skin care regimen upon admission and as needed Assess ulceration(s) every visit Notes: Electronic Signature(s) Signed: 12/24/2016 3:36:38 PM By: Alric Quan Entered By: Alric Quan on 12/24/2016 13:29:40 Millar, [name] ([id number]) -------------------------------------------------------------------------------- Pain Assessment Details Patient Name: BRETTON, TANDY 12/24/2016 1:15 Date of Service: PM Medical Record [id number] Number: Patient Account Number: [id number] Date of Birth/Sex: July 22, 1950 (66 y.o. Male) Treating RN: Anastasio Champion, Other Clinician: Primary Care Shearon Clonch: Olivia Mackie Treating Britto, Cherie Ouch, Jereld Presti/Extender: Referring Alvar Malinoski: Harless Nakayama in Treatment: 13 Active Problems Location of Pain Severity and Description of Pain Patient Has Paino No Site Locations Pain Management and Medication Current Pain Management: Electronic Signature(s) Signed: 12/24/2016 3:36:38 PM By: Alric Quan Entered By: Alric Quan on 12/24/2016 13:21:56 Lanter, [name] ([id number]) -------------------------------------------------------------------------------- Patient/Caregiver Education Details Patient Name: [name] 12/24/2016 1:15 Date of Service: PM Medical Record [id number] Number: Patient Account Number: [id number] Date of Birth/Gender: November 19, 1950 (65 y.o. Male) Treating RN: Anastasio Champion, Other Clinician: Primary Care Physician: Olivia Mackie Treating Britto, Cherie Ouch, Physician/Extender: Referring Physician: Harless Nakayama in Treatment: 13 Education Assessment Education Provided To: Patient Education Topics Provided Wound/Skin Impairment: Handouts: Other: change dressing as ordered Methods: Demonstration, Explain/Verbal Responses: State content correctly Electronic Signature(s) Signed: 12/24/2016 3:36:38 PM By: Alric Quan Entered By:  Alric Quan on 12/24/2016 13:33:22 Hersch, [name] ([id number]) -------------------------------------------------------------------------------- Wound Assessment Details Patient Name: [name]. 12/24/2016 1:15 Date of Service: PM Medical Record [id number] Number: Patient Account Number: [id number] Date of Birth/Sex: September 08, 1950 (66 y.o. Male) Treating RN: Anastasio Champion, Other Clinician: Primary Care Aurea Aronov: Olivia Mackie Treating Britto, Cherie Ouch, Reeder Brisby/Extender: Referring Jolan Mealor: Harless Nakayama in Treatment: 13 Wound Status Wound Number: 1 Primary Diabetic Wound/Ulcer of the Lower Etiology: Extremity Wound Location: Right Toe Great Wound Open Wounding Event: Gradually Appeared Status: Date Acquired: 06/11/2016 Comorbid Anemia, Lymphedema, Congestive Weeks Of Treatment: 13 History: Heart Failure, Hypertension, Peripheral Clustered Wound: No Venous Disease, Type II Diabetes, Pending Amputation On Presentation Neuropathy Photos Photo Uploaded By: Alric Quan on 12/24/2016 15:26:11 Wound Measurements Length: (cm) 1 Width: (cm) 6 Depth: (cm) 0.2 Area: (cm) 4.712 Volume: (cm) 0.942 % Reduction in Area: 90% % Reduction in Volume: 80% Epithelialization: Large (67-100%) Tunneling: No Undermining: No Wound Description Classification: Grade 1 Foul Odor Afte Wound Margin: Flat and Intact Due to Product Exudate Amount: Large Slough/Fibrino Exudate Type: Serosanguineous Exudate Color: red, brown r Cleansing: Yes Use: No Yes Wound Bed Ottley, Hoby E. ([id number]) Granulation Amount: Medium (34-66%) Exposed Structure Granulation Quality: Pink Fascia Exposed: No Necrotic Amount: Medium (34-66%) Fat Layer (Subcutaneous Tissue) Exposed: Yes Necrotic Quality: Adherent Slough Tendon Exposed: No Muscle Exposed: No Joint Exposed: No Bone Exposed: No Periwound Skin Texture Texture Color No Abnormalities Noted: No No Abnormalities  Noted: No Callus: No Atrophie Blanche: No Crepitus: No Cyanosis: No Excoriation: Yes Ecchymosis: No Induration: No Erythema: No Rash: No Hemosiderin Staining: No Scarring: No Mottled: No Pallor: No Moisture Rubor: No No Abnormalities Noted: No Dry / Scaly: No Temperature / Pain Maceration: Yes Temperature: No Abnormality Wound Preparation Ulcer Cleansing: Rinsed/Irrigated with Saline Topical Anesthetic Applied: None Treatment Notes Wound #1 (Right Toe Great) 1. Cleansed with: Clean wound with Normal Saline 2. Anesthetic Topical Lidocaine 4% cream to wound bed prior to debridement 3. Peri-wound Care: Antifungal cream 4. Dressing Applied: Aquacel Ag 5. Secondary Dressing Applied Dry Gauze Kerlix/Conform 7. Secured with Tape Notes netting Electronic Signature(s) Signed: 12/24/2016 3:36:38 PM By:  Santhiago, [place] ([id number]) Entered By: Alric Quan on 12/24/2016 13:27:30 Rester, HOA DERISO ([id number]) -------------------------------------------------------------------------------- Wound Assessment Details Patient Name: DARRIO, BADE 12/24/2016 1:15 Date of Service: PM Medical Record [id number] Number: Patient Account Number: [id number] Date of Birth/Sex: Feb 08, 1951 (66 y.o. Male) Treating RN: Anastasio Champion, Other Clinician: Primary Care Toussaint Golson: Olivia Mackie Treating Britto, Cherie Ouch, Felice Deem/Extender: Referring Carolin Quang: Harless Nakayama in Treatment: 13 Wound Status Wound Number: 2 Primary Diabetic Wound/Ulcer of the Lower Etiology: Extremity Wound Location: Right Toe Second Wound Open Wounding Event: Gradually Appeared Status: Date Acquired: 06/11/2016 Comorbid Anemia, Lymphedema, Congestive Weeks Of Treatment: 13 History: Heart Failure, Hypertension, Peripheral Clustered Wound: No Venous Disease, Type II Diabetes, Pending Amputation On Presentation Neuropathy Photos Photo Uploaded By: Alric Quan on  12/24/2016 15:26:11 Wound Measurements Length: (cm) 1.2 Width: (cm) 1.3 Depth: (cm) 0.1 Area: (cm) 1.225 Volume: (cm) 0.123 % Reduction in Area: 60.1% % Reduction in Volume: 59.9% Epithelialization: Small (1-33%) Tunneling: No Undermining: No Wound Description Classification: Grade 1 Foul Odor Afte Wound Margin: Flat and Intact Due to Product Exudate Amount: Large Slough/Fibrino Exudate Type: Serosanguineous Exudate Color: red, brown r Cleansing: Yes Use: No Yes Wound Bed Eberlin, Cederick E. ([id number]) Granulation Amount: Large (67-100%) Exposed Structure Granulation Quality: Pink Fascia Exposed: No Necrotic Amount: None Present (0%) Fat Layer (Subcutaneous Tissue) Exposed: Yes Tendon Exposed: No Muscle Exposed: No Joint Exposed: No Bone Exposed: No Periwound Skin Texture Texture Color No Abnormalities Noted: No No Abnormalities Noted: No Callus: No Atrophie Blanche: No Crepitus: No Cyanosis: No Excoriation: Yes Ecchymosis: No Induration: No Erythema: No Rash: No Hemosiderin Staining: No Scarring: No Mottled: No Pallor: No Moisture Rubor: No No Abnormalities Noted: No Dry / Scaly: No Temperature / Pain Maceration: Yes Temperature: No Abnormality Wound Preparation Ulcer Cleansing: Rinsed/Irrigated with Saline Topical Anesthetic Applied: None Treatment Notes Wound #2 (Right Toe Second) 1. Cleansed with: Clean wound with Normal Saline 2. Anesthetic Topical Lidocaine 4% cream to wound bed prior to debridement 3. Peri-wound Care: Antifungal cream 4. Dressing Applied: Aquacel Ag 5. Secondary Dressing Applied Dry Gauze Kerlix/Conform 7. Secured with Tape Notes netting Electronic Signature(s) Signed: 12/24/2016 3:36:38 PM By: Elson Clan, [name] ([id number]) Entered By: Alric Quan on 12/24/2016 13:28:02 Dominique, [name] ([id number]) -------------------------------------------------------------------------------- Wound Assessment  Details Patient Name: ZACKAREY, HOLLEMAN 12/24/2016 1:15 Date of Service: PM Medical Record [id number] Number: Patient Account Number: [id number] Date of Birth/Sex: 29-Jun-1950 (66 y.o. Male) Treating RN: Anastasio Champion, Other Clinician: Primary Care Demitria Hay: Olivia Mackie Treating Britto, Cherie Ouch, Azura Tufaro/Extender: Referring Yahaira Bruski: Harless Nakayama in Treatment: 13 Wound Status Wound Number: 5 Primary Diabetic Wound/Ulcer of the Lower Etiology: Extremity Wound Location: Left Toe Great Wound Open Wounding Event: Gradually Appeared Status: Date Acquired: 06/11/2016 Comorbid Anemia, Lymphedema, Congestive Weeks Of Treatment: 13 History: Heart Failure, Hypertension, Peripheral Clustered Wound: No Venous Disease, Type II Diabetes, Pending Amputation On Presentation Neuropathy Photos Photo Uploaded By: Alric Quan on 12/24/2016 15:35:34 Wound Measurements Length: (cm) 0.5 Width: (cm) 0.7 Depth: (cm) 0.1 Area: (cm) 0.275 Volume: (cm) 0.027 % Reduction in Area: 98.1% % Reduction in Volume: 98.1% Epithelialization: Small (1-33%) Tunneling: No Undermining: No Wound Description Classification: Grade 1 Foul Odor After Wound Margin: Flat and Intact Due to Product Exudate Amount: Large Slough/Fibrino Exudate Type: Serous Exudate Color: amber Cleansing: Yes Use: No Yes Wound Bed Dingledine, Priyansh E. ([id number]) Granulation Amount: Medium (34-66%) Exposed Structure Granulation Quality: Pink Fascia Exposed: No Necrotic Amount: Medium (34-66%) Fat Layer (Subcutaneous Tissue) Exposed: Yes Necrotic Quality: Adherent  Slough Tendon Exposed: No Muscle Exposed: No Joint Exposed: No Bone Exposed: No Periwound Skin Texture Texture Color No Abnormalities Noted: No No Abnormalities Noted: No Callus: No Atrophie Blanche: No Crepitus: No Cyanosis: No Excoriation: Yes Ecchymosis: No Induration: No Erythema: No Rash: No Hemosiderin Staining:  No Scarring: No Mottled: No Pallor: No Moisture Rubor: No No Abnormalities Noted: No Dry / Scaly: No Temperature / Pain Maceration: No Temperature: No Abnormality Wound Preparation Ulcer Cleansing: Rinsed/Irrigated with Saline Topical Anesthetic Applied: None Treatment Notes Wound #5 (Left Toe Great) 1. Cleansed with: Clean wound with Normal Saline 2. Anesthetic Topical Lidocaine 4% cream to wound bed prior to debridement 3. Peri-wound Care: Antifungal cream 4. Dressing Applied: Aquacel Ag 5. Secondary Dressing Applied Dry Gauze Kerlix/Conform 7. Secured with Tape Notes netting Electronic Signature(s) Signed: 12/24/2016 3:36:38 PM By: Elson Clan, [name] ([id number]) Entered By: Alric Quan on 12/24/2016 13:28:26 Mungin, JULIES CARMICKLE ([id number]) -------------------------------------------------------------------------------- Wound Assessment Details Patient Name: MARSHEL, GOLUBSKI 12/24/2016 1:15 Date of Service: PM Medical Record [id number] Number: Patient Account Number: [id number] Date of Birth/Sex: Jun 12, 1950 (66 y.o. Male) Treating RN: Anastasio Champion, Other Clinician: Primary Care Hildur Bayer: Olivia Mackie Treating Britto, Cherie Ouch, Chandrika Sandles/Extender: Referring Dreanna Kyllo: Harless Nakayama in Treatment: 13 Wound Status Wound Number: 6 Primary Diabetic Wound/Ulcer of the Lower Etiology: Extremity Wound Location: Left Toe Second Wound Open Wounding Event: Gradually Appeared Status: Date Acquired: 06/11/2016 Comorbid Anemia, Lymphedema, Congestive Weeks Of Treatment: 13 History: Heart Failure, Hypertension, Peripheral Clustered Wound: No Venous Disease, Type II Diabetes, Pending Amputation On Presentation Neuropathy Photos Photo Uploaded By: Alric Quan on 12/24/2016 15:35:35 Wound Measurements Length: (cm) 0.5 Width: (cm) 0.6 Depth: (cm) 0.1 Area: (cm) 0.236 Volume: (cm) 0.024 % Reduction in Area: 89% %  Reduction in Volume: 88.8% Epithelialization: Small (1-33%) Tunneling: No Undermining: No Wound Description Classification: Grade 1 Foul Odor After Wound Margin: Flat and Intact Due to Product Exudate Amount: Large Slough/Fibrino Exudate Type: Serous Exudate Color: amber Cleansing: Yes Use: No Yes Wound Bed Trinidad, Jehad E. ([id number]) Granulation Amount: Medium (34-66%) Exposed Structure Granulation Quality: Pink Fascia Exposed: No Necrotic Amount: Medium (34-66%) Fat Layer (Subcutaneous Tissue) Exposed: Yes Necrotic Quality: Adherent Slough Tendon Exposed: No Muscle Exposed: No Joint Exposed: No Bone Exposed: No Periwound Skin Texture Texture Color No Abnormalities Noted: No No Abnormalities Noted: No Callus: No Atrophie Blanche: No Crepitus: No Cyanosis: No Excoriation: Yes Ecchymosis: No Induration: No Erythema: No Rash: No Hemosiderin Staining: No Scarring: No Mottled: No Pallor: No Moisture Rubor: No No Abnormalities Noted: No Dry / Scaly: No Temperature / Pain Maceration: Yes Temperature: No Abnormality Wound Preparation Ulcer Cleansing: Rinsed/Irrigated with Saline Topical Anesthetic Applied: None Treatment Notes Wound #6 (Left Toe Second) 1. Cleansed with: Clean wound with Normal Saline 2. Anesthetic Topical Lidocaine 4% cream to wound bed prior to debridement 3. Peri-wound Care: Antifungal cream 4. Dressing Applied: Aquacel Ag 5. Secondary Dressing Applied Dry Gauze Kerlix/Conform 7. Secured with Tape Notes netting Electronic Signature(s) Signed: 12/24/2016 3:36:38 PM By: Warnell Bureau ([id number]) Entered By: Alric Quan on 12/24/2016 13:28:54 DESHANNON, HINCHLIFFE ([id number]) -------------------------------------------------------------------------------- Vitals Details Patient Name: MATTHEW, CINA 12/24/2016 1:15 Date of Service: PM Medical Record [id number] Number: Patient Account Number: [id number] Date of  Birth/Sex: 17-May-1950 (66 y.o. Male) Treating RN: Anastasio Champion, Other Clinician: Primary Care Jodeci Roarty: Olivia Mackie Treating Britto, Cherie Ouch, Latif Nazareno/Extender: Referring Javaris Wigington: Harless Nakayama in Treatment: 13 Vital Signs Time Taken: 13:21 Temperature (F): 98.2 Height (in): 69 Pulse (bpm): 69 Weight (lbs): 168  Respiratory Rate (breaths/min): 16 Body Mass Index (BMI): 24.8 Blood Pressure (mmHg): 145/79 Reference Range: 80 - 120 mg / dl Electronic Signature(s) Signed: 12/24/2016 3:36:38 PM By: Alric Quan Entered By: Alric Quan on 12/24/2016 13:22:16

## 2016-12-24 NOTE — Progress Notes (Signed)
DILLEN, BELMONTES ([id number]) Visit Report for 12/24/2016 Chief Complaint Document Details Patient Name: [name], [name] 12/24/2016 1:15 Date of Service: PM Medical Record [id number] Number: Patient Account Number: [id number] Date of Birth/Sex: 1950/07/11 (66 y.o. Male) Treating RN: [name], Other Clinician: Primary Care Provider: Olivia [name] Treating [name], [name], Provider/Extender: Referring Provider: Harless [name] in Treatment: 13 Information Obtained from: Patient Chief Complaint Patients presents for treatment of an open diabetic ulcer to both feet Electronic Signature(s) Signed: 12/24/2016 3:43:44 PM By: [name] Entered By: [name] on 12/24/2016 14:11:27 [name] ([id number]) -------------------------------------------------------------------------------- HPI Details Patient Name: [name], [name] 12/24/2016 1:15 Date of Service: PM Medical Record [id number] Number: Patient Account Number: [id number] Date of Birth/Sex: [birth year] (66 y.o. Male) Treating RN: [name], Other Clinician: Primary Care Provider: Olivia [name] Treating [name], [name], Provider/Extender: Referring Provider: Harless [name] in Treatment: 13 History of Present Illness Location: bilateral feet ulceration on the toes Quality: Patient reports experiencing a dull pain to affected area(s). Severity: Patient states wound are getting better Duration: Patient has had the wound for > 3 months prior to seeking treatment at the wound center Timing: Pain in wound is constant (hurts all the time) Context: The wound would happen gradually Modifying Factors: Other treatment(s) tried include:treatment for lymphedema and is seen by the podiatrist [name] Associated Signs and Symptoms: Patient reports having increase swelling. HPI Description: 66 year old patient here to see as for bilateral feet ulceration to on his left first and second toe and 2 on his  right first and second toe, which she's had for about 4 months. 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 The patient is also undergoing workup by medical oncology for a intra-abdominal lymphadenopathy. 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 He is also status post appendectomy, inguinal lymph node biopsy, rectal biopsy and rectal examination under anesthesia. he currently smokes cigarettes about half packet a day. In March of this year he was seen by [name], for evaluation of bilateral varicose veins and besides wearing compression stockings he had recommended laser ablation of the right and left great saphenous veins to eleviate the symptoms and complications of severe superficial venous reflux disease. He also recommended lymphedema pumps for better control of his lymphedema. The patient recently has had on 08/23/2016, right greater saphenous vein ablation with the laser energy Earlier lower extremity venous reflux examination done on 05/08/2016 showed no DVT or SVT both lower legs but incompetence of bilateral great saphenous veins was present. A lower arterial study was also done and there was no significant right lower and left lower extremity problems based on a normal toe brachial index bilaterally and the ABI was 1.21 the left and 1.23 on the right. His post ablation venous duplex examination showed successful ablation of the right GS vein with thrombus formation 2 below the right saphenofemoral junction. The deep system was patent without evidence of thrombosis and this was done on 08/30/2016. the patient also has a squamous cell cancer of the skin of the buttock and is recently undergone radiation therapy for  this prior to excisional surgery. Addendum: regarding his x-rays done today and x-ray of the left foot -- IMPRESSION: No objective [name], [name] ([id number]) evidence of osteomyelitis. There are soft tissue  changes which may reflect cellulitis. X-ray of the right foot -- IMPRESSION:Findings compatible with cellulitis of the toes. No objective evidence of osteomyelitis is observed. 10/01/16 on evaluation today patient's wounds appeared to be doing some better. I did review the x-rays as well which showed no evidence of osteomyelitis although there was evidence on x-ray of cellulitis. He fortunately is not having any discomfort although he continues to have some swelling. He does not remember being on any antibiotics recently. 10/15/16 on evaluation today patient's wounds overall appear to be doing better although he does have a new location noted on the left foot. Fortunately he is not having significant pain. It almost has the appearance that something is rubbing on the end of his toes but he wears the open toe shoes and according to what he is telling me never wears anything that would rub on his foot. There is no evidence of infection and specifically no evidence of a fungal infection 10/22/16 On evaluation today patient's wounds appeared to be doing better compared to last week in regard to his bilateral lower extremities. Fortunately I happy with how things are progressing although he still has ulcers I feel like that he is improving and appropriate manner. 11/12/16 on evaluation today patient appears to be doing well in regard to his bilateral feet and the respective wounds. We have been using surrounding her dressings along with an antifungal cream which seems to be doing very well. He has no bilateral dysfunction noticed that the rituals are weight loss at this point. He also has no nausea or vomiting a note purulent discharge. He did see Vein and vascular today and he tells me that they told  him he could have surgery for his venous stasis but they did not feel like it was worth it in his words. Fortunately patient's wounds do appear to be getting sneakily better. 11/26/2016 -- he says he is going to have some surgery during this week at [facility] for possibly a colon resection. Electronic Signature(s) Signed: 12/24/2016 3:43:44 PM By: [name] Entered By: [name] on 12/24/2016 14:11:39 [name], [name] ([id number]) -------------------------------------------------------------------------------- Physical Exam Details Patient Name: [name], [name] 12/24/2016 1:15 Date of Service: PM Medical Record [id number] Number: Patient Account Number: [id number] Date of Birth/Sex: [birth year] (66 y.o. Male) Treating RN: [name], Other Clinician: Primary Care Provider: Olivia [name] Treating Chasitty Hehl, [name], Provider/Extender: Referring Provider: Harless [name] in Treatment: 13 Constitutional . Pulse regular. Respirations normal and unlabored. Afebrile. . Eyes Nonicteric. Reactive to light. Ears, Nose, Mouth, and Throat Lips, teeth, and gums WNL.Marland Kitchen Moist mucosa without lesions. Neck supple and nontender. No palpable supraclavicular or cervical adenopathy. Normal sized without goiter. Respiratory WNL. No retractions.. Cardiovascular Pedal Pulses WNL. No clubbing, cyanosis or edema. Lymphatic No adneopathy. No adenopathy. No adenopathy. Musculoskeletal Adexa without tenderness or enlargement.. Digits and nails w/o clubbing, cyanosis, infection, petechiae, ischemia, or inflammatory conditions.. Integumentary (Hair, Skin) No suspicious lesions. No crepitus or fluctuance. No peri-wound warmth or erythema. No masses.Marland Kitchen Psychiatric Judgement and insight Intact.. No evidence of depression, anxiety, or agitation.. Notes the wounds are looking much better overall and the fungal infection is well controlled. No debridement was required today. Electronic  Signature(s) Signed: 12/24/2016 3:43:44 PM By: [name] Entered By: [name] on 12/24/2016 14:12:26 Roddey, CORRIN HINGLE ([id number]) -------------------------------------------------------------------------------- Physician Orders Details Patient Name: [name], [name] 12/24/2016 1:15 Date of Service: PM Medical Record [id number] Number: Patient Account Number: [id number] Date of Birth/Sex: [birth year] (66 y.o. Male)  Treating RN: Anastasio Champion, Other Clinician: Primary Care Provider: Olivia [name] Treating Glynnis Gavel, [name], Provider/Extender: Referring Provider: Harless [name] in Treatment: 52 Verbal / Phone Orders: Yes Clinician: Carolyne Fiscal, Debi Read Back and Verified: Yes Diagnosis Coding Wound Cleansing Wound #1 Right Toe Great o Cleanse wound with mild soap and water o May Shower, gently pat wound dry prior to applying new dressing. Wound #2 Right Toe Second o Cleanse wound with mild soap and water o May Shower, gently pat wound dry prior to applying new dressing. Wound #5 Left Toe Great o Cleanse wound with mild soap and water o May Shower, gently pat wound dry prior to applying new dressing. Wound #6 Left Toe Second o Cleanse wound with mild soap and water o May Shower, gently pat wound dry prior to applying new dressing. Anesthetic Wound #1 Right Toe Great o Topical Lidocaine 4% cream applied to wound bed prior to debridement Wound #2 Right Toe Second o Topical Lidocaine 4% cream applied to wound bed prior to debridement Wound #5 Left Toe Great o Topical Lidocaine 4% cream applied to wound bed prior to debridement Wound #6 Left Toe Second o Topical Lidocaine 4% cream applied to wound bed prior to debridement Skin Barriers/Peri-Wound Care Wound #1 Right Toe Great o Antifungal cream - on and between toes then AqAg over areas [name], Quincey E. ([id number]) Wound #2 Right Toe Second o Antifungal cream - on  and between toes then AqAg over areas Wound #5 Left Toe Great o Antifungal cream - on and between toes then AqAg over areas Wound #6 Left Toe Second o Antifungal cream - on and between toes then AqAg over areas Primary Wound Dressing Wound #1 Right Toe Great o Aquacel Ag Wound #2 Right Toe Second o Aquacel Ag Wound #5 Left Toe Great o Aquacel Ag Wound #6 Left Toe Second o Aquacel Ag Secondary Dressing Wound #1 Right Toe Great o ABD pad o Gauze and Kerlix/Conform Wound #2 Right Toe Second o ABD pad o Gauze and Kerlix/Conform Wound #5 Left Toe Great o ABD pad o Gauze and Kerlix/Conform Wound #6 Left Toe Second o ABD pad o Gauze and Kerlix/Conform Dressing Change Frequency Wound #1 Right Toe Great o Change dressing every day. Wound #2 Right Toe Second o Change dressing every day. Wound #5 Left [address] Dustin E. ([id number]) o Change dressing every day. Wound #6 Left Toe Second o Change dressing every day. Follow-up Appointments Wound #1 Right Toe Great o Return Appointment in 1 week. Wound #2 Right Toe Second o Return Appointment in 1 week. Wound #5 Left Toe Great o Return Appointment in 1 week. Wound #6 Left Toe Second o Return Appointment in 1 week. Edema Control Wound #1 Right Toe Great o Patient to wear own compression stockings o Elevate legs to the level of the heart and pump ankles as often as possible Wound #2 Right Toe Second o Patient to wear own compression stockings o Elevate legs to the level of the heart and pump ankles as often as possible Wound #5 Left Toe Great o Patient to wear own compression stockings o Elevate legs to the level of the heart and pump ankles as often as possible Wound #6 Left Toe Second o Patient to wear own compression stockings o Elevate legs to the level of the heart and pump ankles as often as possible Additional Orders / Instructions Wound #1 Right Toe  Great o Stop Smoking - Please continue to stay away from alcohol o Other: -  Please add vitamin A, vitamin C, and Zinc supplements to your diet Wound #2 Right Toe Second o Stop Smoking - Please continue to stay away from alcohol o Other: - Please add vitamin A, vitamin C, and Zinc supplements to your diet Wound #5 Left Toe Great o Stop Smoking - Please continue to stay away from alcohol o Other: - Please add vitamin A, vitamin C, and Zinc supplements to your diet [name], [name] ([id number]) Wound #6 Left Toe Second o Stop Smoking - Please continue to stay away from alcohol o Other: - Please add vitamin A, vitamin C, and Zinc supplements to your diet Electronic Signature(s) Signed: 12/24/2016 3:36:38 PM By: Alric Quan Signed: 12/24/2016 3:43:44 PM By: [name] Entered By: Alric Quan on 12/24/2016 13:35:37 [name], [name] ([id number]) -------------------------------------------------------------------------------- Problem List Details Patient Name: [name], [name] 12/24/2016 1:15 Date of Service: PM Medical Record [id number] Number: Patient Account Number: [id number] Date of Birth/Sex: Oct 14, 1950 (66 y.o. Male) Treating RN: [name], Other Clinician: Primary Care Provider: Olivia [name] Treating Avett Reineck, [name], Provider/Extender: Referring Provider: Harless [name] in Treatment: 13 Active Problems ICD-10 Encounter Code Description Active Date Diagnosis E11.621 Type 2 diabetes mellitus with foot ulcer 09/21/2016 Yes I87.313 Chronic venous hypertension (idiopathic) with ulcer of 09/21/2016 Yes bilateral lower extremity I89.0 Lymphedema, not elsewhere classified 09/21/2016 Yes L97.522 Non-pressure chronic ulcer of other part of left foot with fat 09/21/2016 Yes layer exposed L97.512 Non-pressure chronic ulcer of other part of right foot with 09/21/2016 Yes fat layer exposed F17.218 Nicotine dependence, cigarettes, 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 Male) Treating RN: [name], Other Clinician: Primary Care Provider: Olivia [name] Treating Juniel Groene, [name], Provider/Extender: Referring Provider: Harless [name] in Treatment: 13 Subjective Chief Complaint Information obtained from Patient Patients presents for treatment of an open diabetic ulcer to both feet History of Present Illness (HPI) The following HPI elements were documented for the patient's wound: Location: bilateral feet ulceration on the toes Quality: Patient reports experiencing a dull pain to affected area(s). Severity: Patient states wound are getting better Duration: Patient has had the wound for > 3 months prior to seeking treatment at the wound center Timing: Pain in wound is constant (hurts all the time) Context: The wound would happen gradually Modifying Factors: Other treatment(s) tried include:treatment for lymphedema and is seen by the podiatrist [name] Associated Signs and Symptoms: Patient reports having increase swelling. 66 year old patient here to see as for bilateral feet ulceration to on his left first and second toe and 2 on his right first and second toe, which she's had for about 4 months. 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 The patient is also undergoing workup by medical oncology for a intra- abdominal lymphadenopathy. 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 He is also status post appendectomy, inguinal lymph node biopsy, rectal biopsy and rectal examination under  anesthesia. he currently smokes cigarettes about half packet a day. In March of this year he was seen by [name], for evaluation of bilateral varicose veins and besides wearing compression stockings he had recommended laser ablation of the right and left great saphenous veins to eleviate the symptoms and complications of severe superficial venous reflux disease. He also recommended lymphedema pumps for better control of his lymphedema. The patient recently has had on 08/23/2016, right greater saphenous vein ablation with the laser energy Earlier lower extremity venous reflux examination done on 05/08/2016 showed no DVT or SVT both lower legs but incompetence of bilateral great saphenous veins was present. A lower arterial study was also done and there was no significant right lower and left lower extremity problems based on a normal toe brachial index bilaterally and the ABI was 1.21 the left and 1.23 on the Tissue, Tashon E. ([id number]) right. His post ablation venous duplex examination showed successful ablation of the right GS vein with thrombus formation 2 below the right saphenofemoral junction. The deep system was patent without evidence of thrombosis and this was done on 08/30/2016. 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 Addendum: regarding his x-rays done today and x-ray of the left foot -- IMPRESSION: No objective evidence of osteomyelitis. There  are soft tissue changes which may reflect cellulitis. X-ray of the right foot -- IMPRESSION:Findings compatible with cellulitis of the toes. No objective evidence of osteomyelitis is observed. 10/01/16 on evaluation today patient's wounds appeared to be doing some better. I did review the x-rays as well which showed no evidence of osteomyelitis although there was evidence on x-ray of cellulitis. He fortunately is not having any discomfort although he continues to have some swelling. He does not remember being on any antibiotics recently. 10/15/16 on evaluation today patient's wounds overall appear to be doing better although he does have a new location noted on the left foot. Fortunately he is not having significant pain. It almost has the appearance that something is rubbing on the end of his toes but he wears the open toe shoes and according to what he is telling me never wears anything that would rub on his foot. There is no evidence of infection and specifically no evidence of a fungal infection 10/22/16 On evaluation today patient's wounds appeared to be doing better compared to last week in regard to his bilateral lower extremities. Fortunately I happy with how things are progressing although he still has ulcers I feel like that he is improving and appropriate manner. 11/12/16 on evaluation today patient appears to be doing well in regard to his bilateral feet and the respective wounds. We have been using surrounding her dressings along with an antifungal cream which seems to be doing very well. He has no bilateral dysfunction noticed that the rituals are weight loss at this point. He also has no nausea or vomiting a note purulent discharge. He did see Vein and vascular today and he tells me that they told him he could have surgery for his venous stasis but they did not feel like it was worth it in his words. Fortunately patient's wounds do appear to be getting sneakily better. 11/26/2016 -- he  says he is going to have some surgery during this week at [facility] for possibly a colon resection. Objective Constitutional Pulse regular. Respirations normal and unlabored. Afebrile. ZAYDRIAN, BATTA ([id number]) Vitals  Time Taken: 1:21 PM, Height: 69 in, Weight: 168 lbs, BMI: 24.8, Temperature: 98.2 F, Pulse: 69 bpm, Respiratory Rate: 16 breaths/min, Blood Pressure: 145/79 mmHg. Eyes Nonicteric. Reactive to light. Ears, Nose, Mouth, and Throat Lips, teeth, and gums WNL.Marland Kitchen Moist mucosa without lesions. Neck supple and nontender. No palpable supraclavicular or cervical adenopathy. Normal sized without goiter. Respiratory WNL. No retractions.. Cardiovascular Pedal Pulses WNL. No clubbing, cyanosis or edema. Lymphatic No adneopathy. No adenopathy. No adenopathy. Musculoskeletal Adexa without tenderness or enlargement.. Digits and nails w/o clubbing, cyanosis, infection, petechiae, ischemia, or inflammatory conditions.Marland Kitchen Psychiatric Judgement and insight Intact.. No evidence of depression, anxiety, or agitation.. General Notes: the wounds are looking much better overall and the fungal infection is well controlled. No debridement was required today. Integumentary (Hair, Skin) No suspicious lesions. No crepitus or fluctuance. No peri-wound warmth or erythema. No masses.. Wound #1 status is Open. Original cause of wound was Gradually Appeared. The wound is located on the Right Toe Great. The wound measures 1cm length x 6cm width x 0.2cm depth; 4.712cm^2 area and 0.942cm^3 volume. There is Fat Layer (Subcutaneous Tissue) Exposed exposed. There is no tunneling or undermining noted. There is a large amount of serosanguineous drainage noted. Foul odor after cleansing was noted. The wound margin is flat and intact. There is medium (34-66%) pink granulation within the wound bed. There is a medium (34-66%) amount of necrotic tissue within the wound bed including Adherent Slough. The  periwound skin appearance exhibited: Excoriation, Maceration. The periwound skin appearance did not exhibit: Callus, Crepitus, Induration, Rash, Scarring, Dry/Scaly, Atrophie Blanche, Cyanosis, Ecchymosis, Hemosiderin Staining, Mottled, Pallor, Rubor, Erythema. Periwound temperature was noted as No Abnormality. Wound #2 status is Open. Original cause of wound was Gradually Appeared. The wound is located on the Right Toe Second. The wound measures 1.2cm length x 1.3cm width x 0.1cm depth; 1.225cm^2 area and 0.123cm^3 volume. There is Fat Layer (Subcutaneous Tissue) Exposed exposed. There is no tunneling or undermining noted. There is a large amount of serosanguineous drainage noted. Foul odor after cleansing Mundis, Jeancarlos E. ([id number]) was noted. The wound margin is flat and intact. There is large (67-100%) pink granulation within the wound bed. There is no necrotic tissue within the wound bed. The periwound skin appearance exhibited: Excoriation, Maceration. The periwound skin appearance did not exhibit: Callus, Crepitus, Induration, Rash, Scarring, Dry/Scaly, Atrophie Blanche, Cyanosis, Ecchymosis, Hemosiderin Staining, Mottled, Pallor, Rubor, Erythema. Periwound temperature was noted as No Abnormality. Wound #5 status is Open. Original cause of wound was Gradually Appeared. The wound is located on the Left Toe Great. The wound measures 0.5cm length x 0.7cm width x 0.1cm depth; 0.275cm^2 area and 0.027cm^3 volume. There is Fat Layer (Subcutaneous Tissue) Exposed exposed. There is no tunneling or undermining noted. There is a large amount of serous drainage noted. Foul odor after cleansing was noted. The wound margin is flat and intact. There is medium (34-66%) pink granulation within the wound bed. There is a medium (34-66%) amount of necrotic tissue within the wound bed including Adherent Slough. The periwound skin appearance exhibited: Excoriation. The periwound skin appearance did not  exhibit: Callus, Crepitus, Induration, Rash, Scarring, Dry/Scaly, Maceration, Atrophie Blanche, Cyanosis, Ecchymosis, Hemosiderin Staining, Mottled, Pallor, Rubor, Erythema. Periwound temperature was noted as No Abnormality. Wound #6 status is Open. Original cause of wound was Gradually Appeared. The wound is located on the Left Toe Second. The wound measures 0.5cm length x 0.6cm width x 0.1cm depth; 0.236cm^2 area and 0.024cm^3 volume. There is Fat Layer (Subcutaneous Tissue)  Exposed exposed. There is no tunneling or undermining noted. There is a large amount of serous drainage noted. Foul odor after cleansing was noted. The wound margin is flat and intact. There is medium (34-66%) pink granulation within the wound bed. There is a medium (34-66%) amount of necrotic tissue within the wound bed including Adherent Slough. The periwound skin appearance exhibited: Excoriation, Maceration. The periwound skin appearance did not exhibit: Callus, Crepitus, Induration, Rash, Scarring, Dry/Scaly, Atrophie Blanche, Cyanosis, Ecchymosis, Hemosiderin Staining, Mottled, Pallor, Rubor, Erythema. Periwound temperature was noted as No Abnormality. 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 ([id number]) Wound Cleansing: Wound #1 Right Toe Great: Cleanse wound with mild soap and water May Shower, gently pat wound dry prior to applying new dressing. Wound #2 Right Toe Second: Cleanse wound with  mild soap and water May Shower, gently pat wound dry prior to applying new dressing. Wound #5 Left Toe Great: Cleanse wound with mild soap and water May Shower, gently pat wound dry prior to applying new dressing. Wound #6 Left Toe Second: Cleanse wound with mild soap and water May Shower, gently pat wound dry prior to applying new dressing. Anesthetic: Wound #1 Right Toe Great: Topical Lidocaine 4% cream applied to wound bed prior to debridement Wound #2 Right Toe Second: Topical Lidocaine 4% cream applied to wound bed prior to debridement Wound #5 Left Toe Great: Topical Lidocaine 4% cream applied to wound bed prior to debridement Wound #6 Left Toe Second: Topical Lidocaine 4% cream applied to wound bed prior to debridement Skin Barriers/Peri-Wound Care: Wound #1 Right Toe Great: Antifungal cream - on and between toes then AqAg over areas Wound #2 Right Toe Second: Antifungal cream - on and between toes then AqAg over areas Wound #5 Left Toe Great: Antifungal cream - on and between toes then AqAg over areas Wound #6 Left Toe Second: Antifungal cream - on and between toes then AqAg over areas Primary Wound Dressing: Wound #1 Right Toe Great: Aquacel Ag Wound #2 Right Toe Second: Aquacel Ag Wound #5 Left Toe Great: Aquacel Ag Wound #6 Left Toe Second: Aquacel Ag Secondary Dressing: Wound #1 Right Toe Great: ABD pad Gauze and Kerlix/Conform Wound #2 Right Toe Second: ABD pad Gauze and Kerlix/Conform Wound #5 Left Toe Great: ABD pad Gauze and Kerlix/Conform [name], [name] ([id number]) Wound #6 Left Toe Second: ABD pad Gauze and Kerlix/Conform Dressing Change Frequency: Wound #1 Right Toe Great: Change dressing every day. Wound #2 Right Toe Second: Change dressing every day. Wound #5 Left Toe Great: Change dressing every day. Wound #6 Left Toe Second: Change dressing every day. Follow-up Appointments: Wound #1 Right Toe Great: Return Appointment in 1  week. Wound #2 Right Toe Second: Return Appointment in 1 week. Wound #5 Left Toe Great: Return Appointment in 1 week. Wound #6 Left Toe Second: Return Appointment in 1 week. Edema Control: Wound #1 Right Toe Great: Patient to wear own compression stockings Elevate legs to the level of the heart and pump ankles as often as possible Wound #2 Right Toe Second: Patient to wear own  compression stockings Elevate legs to the level of the heart and pump ankles as often as possible Wound #5 Left Toe Great: Patient to wear own compression stockings Elevate legs to the level of the heart and pump ankles as often as possible Wound #6 Left Toe Second: Patient to wear own compression stockings Elevate legs to the level of the heart and pump ankles as often as possible Additional Orders / Instructions: Wound #1 Right Toe Great: Stop Smoking - Please continue to stay away from alcohol Other: - Please add vitamin A, vitamin C, and Zinc supplements to your diet Wound #2 Right Toe Second: Stop Smoking - Please continue to stay away from alcohol Other: - Please add vitamin A, vitamin C, and Zinc supplements to your diet Wound #5 Left Toe Great: Stop Smoking - Please continue to stay away from alcohol Other: - Please add vitamin A, vitamin C, and Zinc supplements to your diet Wound #6 Left Toe Second: Stop Smoking - Please continue to stay away from alcohol Other: - Please add vitamin A, vitamin C, and Zinc supplements to your diet [name], [name] ([id number]) there has been good improvement over the last few weeks and after review today, I have recommended: 1. Silver alginate to be applied over his wounds, and change daily after washing with soap and water. 2. Lotrisone ointment to be applied liberally to his toes and interdigital areas 3. Good control of his diabetes mellitus 4. the need to completely give up smoking discussed with him in great detail 5. regular visits the wound center Electronic  Signature(s) Signed: 12/24/2016 3:43:44 PM By: [name] Entered By: [name] on 12/24/2016 14:13:37 Stotz, [name] ([id number]) -------------------------------------------------------------------------------- SuperBill Details Patient Name: Flagg, Montavious E. Date of Service: 12/24/2016 Medical Record Number: [id number] Patient Account Number: [id number] Date of Birth/Sex: [birth year] (66 y.o. 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 F17.218 Nicotine dependence, cigarettes, 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 abuse with unspecified alcohol-induced disorder Facility Procedures CPT4 Code: [id number] Description: 99214 - WOUND CARE VISIT-LEV 4 EST PT Modifier: Quantity: 1 Physician Procedures CPT4: Description Modifier Quantity Code [id number] - WC PHYS LEVEL 3 - EST PT 1 ICD-10 Description Diagnosis E11.621 Type 2 diabetes mellitus with foot ulcer I87.313 Chronic venous hypertension (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

## 2016-12-31 ENCOUNTER — Encounter: Payer: Medicare HMO | Admitting: Surgery

## 2016-12-31 DIAGNOSIS — E11621 Type 2 diabetes mellitus with foot ulcer: Secondary | ICD-10-CM | POA: Diagnosis not present

## 2017-01-01 ENCOUNTER — Encounter (HOSPITAL_COMMUNITY): Payer: Self-pay

## 2017-01-01 NOTE — Progress Notes (Signed)
AZAAN, LEASK ([id number]) Visit Report for 12/31/2016 Arrival Information Details Patient Name: [name], [name] 12/31/2016 8:00 Date of Service: AM Medical Record [id number] Number: Patient Account Number: [id number] Date of Birth/Sex: 11/11/1950 (66 y.o. Male) Treating RN: [name], Other Clinician: Primary Care [name]: [name] Treating [name], [name] Ouch, [name]/Extender: Referring [name]: [name] in Treatment: 14 Visit Information History Since Last Visit All ordered tests and consults were completed: No Patient Arrived: Ambulatory Added or deleted any medications: No Arrival Time: 08:06 Any new allergies or adverse reactions: No Accompanied By: family Had a fall or experienced change in No Transfer Assistance: None activities of daily living that may affect Patient Identification Verified: Yes risk of falls: Secondary Verification Process Yes Signs or symptoms of abuse/neglect since last No Completed: visito Patient Requires Transmission-Based No Hospitalized since last visit: No Precautions: Has Dressing in Place as Prescribed: Yes Patient Has Alerts: Yes Pain Present Now: No Patient Alerts: DMII Electronic Signature(s) Signed: 12/31/2016 4:26:24 PM By: [name] Entered By: [name] on 12/31/2016 08:15:18 [name] Johnette Abraham ([id number]) -------------------------------------------------------------------------------- Clinic Level of Care Assessment Details Patient Name: [name] 12/31/2016 8:00 Date of Service: AM Medical Record [id number] Number: Patient Account Number: [id number] Date of Birth/Sex: 12-Dec-1950 (66 y.o. Male) Treating RN: [name], Other Clinician: Primary Care [name]: [name] Treating [name], [name] Ouch, [name]/Extender: Referring [name]: [name] in Treatment: 14 Clinic Level of Care Assessment Items TOOL 4 Quantity Score X - Use when  only an EandM is performed on FOLLOW-UP visit 1 0 ASSESSMENTS - Nursing Assessment / Reassessment X - Reassessment of Co-morbidities (includes updates in patient status) 1 10 X - Reassessment of Adherence to Treatment Plan 1 5 ASSESSMENTS - Wound and Skin Assessment / Reassessment []  - Simple Wound Assessment / Reassessment - one wound 0 X - Complex Wound Assessment / Reassessment - multiple wounds 6 5 []  - Dermatologic / Skin Assessment (not related to wound area) 0 ASSESSMENTS - Focused Assessment []  - Circumferential Edema Measurements - multi extremities 0 []  - Nutritional Assessment / Counseling / Intervention 0 []  - Lower Extremity Assessment (monofilament, tuning fork, pulses) 0 []  - Peripheral Arterial Disease Assessment (using hand held doppler) 0 ASSESSMENTS - Ostomy and/or Continence Assessment and Care []  - Incontinence Assessment and Management 0 []  - Ostomy Care Assessment and Management (repouching, etc.) 0 PROCESS - Coordination of Care X - Simple Patient / Family Education for ongoing care 1 15 []  - Complex (extensive) Patient / Family Education for ongoing care 0 []  - Staff obtains Consents, Records, Test Results / Process Orders 0 [name], [name] ([id number]) []  - Staff telephones HHA, Nursing Homes / Clarify orders / etc 0 []  - Routine Transfer to another Facility (non-emergent condition) 0 []  - Routine Hospital Admission (non-emergent condition) 0 []  - New Admissions / Biomedical engineer / Ordering NPWT, Apligraf, etc. 0 []  - Emergency Hospital Admission (emergent condition) 0 X - Simple Discharge Coordination 1 10 []  - Complex (extensive) Discharge Coordination 0 PROCESS - Special Needs []  - Pediatric / Minor Patient Management 0 []  - Isolation Patient Management 0 []  - Hearing / Language / Visual special needs 0 []  - Assessment of Community assistance (transportation, D/C planning, etc.) 0 []  - Additional assistance / Altered mentation 0 []  - Support  Surface(s) Assessment (bed, cushion, seat, etc.) 0 INTERVENTIONS - Wound Cleansing / Measurement []  - Simple Wound Cleansing - one wound 0 X - Complex Wound Cleansing - multiple wounds 6 5 X - Wound  Imaging (photographs - any number of wounds) 1 5 []  - Wound Tracing (instead of photographs) 0 []  - Simple Wound Measurement - one wound 0 X - Complex Wound Measurement - multiple wounds 6 5 INTERVENTIONS - Wound Dressings X - Small Wound Dressing one or multiple wounds 6 10 []  - Medium Wound Dressing one or multiple wounds 0 []  - Large Wound Dressing one or multiple wounds 0 X - Application of Medications - topical 1 5 []  - Application of Medications - injection 0 [name], [name] ([id number]) INTERVENTIONS - Miscellaneous []  - External ear exam 0 []  - Specimen Collection (cultures, biopsies, blood, body fluids, etc.) 0 []  - Specimen(s) / Culture(s) sent or taken to Lab for analysis 0 []  - Patient Transfer (multiple staff / Harrel Lemon Lift / Similar devices) 0 []  - Simple Staple / Suture removal (25 or less) 0 []  - Complex Staple / Suture removal (26 or more) 0 []  - Hypo / Hyperglycemic Management (close monitor of Blood Glucose) 0 []  - Ankle / Brachial Index (ABI) - do not check if billed separately 0 X - Vital Signs 1 5 Has the patient been seen at the hospital within the last three years: Yes Total Score: 205 Level Of Care: New/Established - Level 5 Electronic Signature(s) Signed: 12/31/2016 4:26:24 PM By: [name] Entered By: [name] on 12/31/2016 09:09:48 [name], [name] ([id number]) -------------------------------------------------------------------------------- Encounter Discharge Information Details Patient Name: [name] 12/31/2016 8:00 Date of Service: AM Medical Record [id number] Number: Patient Account Number: [id number] Date of Birth/Sex: 1950-07-09 (67 y.o. Male) Treating RN: [name], Other Clinician: Primary Care  [name]: [name] Treating [name], [name] Ouch, [name]/Extender: Referring Yoon Barca: [name] in Treatment: 14 Encounter Discharge Information Items Discharge Pain Level: 0 Discharge Condition: Stable Ambulatory Status: Ambulatory Discharge Destination: Home Transportation: Private Auto Accompanied By: family Schedule Follow-up Appointment: Yes Medication Reconciliation completed and provided to Patient/Care No Jaedin Regina: Provided on Clinical Summary of Care: 12/31/2016 Form Type Recipient Paper Patient ED Electronic Signature(s) Signed: 12/31/2016 4:24:40 PM By: Ruthine Dose Entered By: Ruthine Dose on 12/31/2016 08:44:57 Scherr, [name] ([id number]) -------------------------------------------------------------------------------- Lower Extremity Assessment Details Patient Name: YIGIT, NORKUS 12/31/2016 8:00 Date of Service: AM Medical Record [id number] Number: Patient Account Number: [id number] Date of Birth/Sex: March 12, 1951 (66 y.o. Male) Treating RN: [name], Other Clinician: Primary Care Derryl Uher: [name] Treating [name], [name] Ouch, Raywood Wailes/Extender: Referring Jordana Dugue: [name] in Treatment: 14 Vascular Assessment Pulses: Dorsalis Pedis Palpable: [Left:Yes] [Right:Yes] Posterior Tibial Extremity colors, hair growth, and conditions: Extremity Color: [Left:Hyperpigmented] [Right:Hyperpigmented] Temperature of Extremity: [Left:Warm] [Right:Warm] Capillary Refill: [Left:< 3 seconds] [Right:< 3 seconds] Toe Nail Assessment Left: Right: Thick: Yes Yes Discolored: Yes Yes Deformed: Yes Yes Improper Length and Hygiene: Yes Yes Electronic Signature(s) Signed: 12/31/2016 4:26:24 PM By: [name] Entered By: [name] on 12/31/2016 08:29:31 [place]ston, [name] ([id number]) -------------------------------------------------------------------------------- Multi Wound Chart Details Patient Name: [name] Brittle E. 12/31/2016 8:00 Date of Service: AM Medical Record [id number] Number: Patient Account Number: [id number] Date of Birth/Sex: 1950-12-08 (66 y.o. Male) Treating RN: [name], Other Clinician: Primary Care Sheralee Qazi: [name] Treating [name], [name] Ouch, Tank Difiore/Extender: Referring Gerrell Tabet: [name] in Treatment: 14 Vital Signs Height(in): 69 Pulse(bpm): 67 Weight(lbs): 168 Blood Pressure 160/78 (mmHg): Body Mass Index(BMI): 25 Temperature(F): 98.2 Respiratory Rate 16 (breaths/min): Photos: [1:No Photos] [2:No Photos] [5:No Photos] Wound Location: [1:Right Toe Great] [2:Right Toe Second] [5:Left Toe Great] Wounding Event: [1:Gradually Appeared] [2:Gradually Appeared] [5:Gradually Appeared] Primary Etiology: [1:Diabetic Wound/Ulcer of the Lower Extremity] [2:Diabetic  Wound/Ulcer of the Lower Extremity] [5:Diabetic Wound/Ulcer of the Lower Extremity] Comorbid History: [1:Anemia, Lymphedema, Congestive Heart Failure, Hypertension, Peripheral Venous Disease, Type II Diabetes, Neuropathy] [2:Anemia, Lymphedema, Congestive Heart Failure, Hypertension, Peripheral Venous Disease, Type II Diabetes,  Neuropathy] [5:Anemia, Lymphedema, Congestive Heart Failure, Hypertension, Peripheral Venous Disease, Type II Diabetes, Neuropathy] Date Acquired: [1:06/11/2016] [2:06/11/2016] [5:06/11/2016] Weeks of Treatment: [1:14] [2:14] [5:14] Wound Status: [1:Open] [2:Open] [5:Open] Pending Amputation on Yes [2:Yes] [5:Yes] Presentation: Measurements L x W x D 1.8x6x0.2 [2:1.4x1.8x0.1] [5:0.8x1.2x0.1] (cm) Area (cm) : [1:8.482] [2:1.979] [5:0.754] Volume (cm) : [1:1.696] [2:0.198] [5:0.075] % Reduction in Area: [1:82.00%] [2:35.60%] [5:94.70%] % Reduction in Volume: 64.00% [2:35.50%] [5:94.80%] Classification: [1:Grade 1] [2:Grade 1] [5:Grade 1] Exudate Amount: [1:Large] [2:Large] [5:Large] Exudate Type: [1:Serosanguineous] [2:Serosanguineous]  [5:Serous] Exudate Color: [1:red, brown] [2:red, brown] [5:amber] Foul Odor After [1:Yes] [2:Yes] [5:Yes] Cleansing: Bellina, Geo E. ([id number]) Odor Anticipated Due to No No No Product Use: Wound Margin: Flat and Intact Flat and Intact Flat and Intact Granulation Amount: Medium (34-66%) Large (67-100%) Large (67-100%) Granulation Quality: Pink Pink Pink Necrotic Amount: Medium (34-66%) None Present (0%) Small (1-33%) Exposed Structures: Fat Layer (Subcutaneous Fat Layer (Subcutaneous Fat Layer (Subcutaneous Tissue) Exposed: Yes Tissue) Exposed: Yes Tissue) Exposed: Yes Fascia: No Fascia: No Fascia: No Tendon: No Tendon: No Tendon: No Muscle: No Muscle: No Muscle: No Joint: No Joint: No Joint: No Bone: No Bone: No Bone: No Epithelialization: Large (67-100%) Small (1-33%) Small (1-33%) Periwound Skin Texture: Excoriation: Yes Excoriation: Yes Excoriation: Yes Induration: No Induration: No Induration: No Callus: No Callus: No Callus: No Crepitus: No Crepitus: No Crepitus: No Rash: No Rash: No Rash: No Scarring: No Scarring: No Scarring: No Periwound Skin Maceration: Yes Maceration: Yes Maceration: No Moisture: Dry/Scaly: No Dry/Scaly: No Dry/Scaly: No Periwound Skin Color: Atrophie Blanche: No Atrophie Blanche: No Atrophie Blanche: No Cyanosis: No Cyanosis: No Cyanosis: No Ecchymosis: No Ecchymosis: No Ecchymosis: No Erythema: No Erythema: No Erythema: No Hemosiderin Staining: No Hemosiderin Staining: No Hemosiderin Staining: No Mottled: No Mottled: No Mottled: No Pallor: No Pallor: No Pallor: No Rubor: No Rubor: No Rubor: No Temperature: No Abnormality No Abnormality No Abnormality Tenderness on No No No Palpation: Wound Preparation: Ulcer Cleansing: Ulcer Cleansing: Ulcer Cleansing: Rinsed/Irrigated with Rinsed/Irrigated with Rinsed/Irrigated with Saline Saline Saline Topical Anesthetic Topical Anesthetic Topical Anesthetic Applied:  None Applied: None Applied: None Wound Number: [id number] [phone]  Photos: No Photos No Photos No Photos Wound Location: Left Toe Second Left Toe Third Right Toe Third Wounding Event: Gradually Appeared Gradually Appeared Gradually Appeared Primary 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 the Lower Extremity the Lower Extremity Comorbid History: Anemia, Lymphedema, Anemia, Lymphedema, Anemia, Lymphedema, Congestive Heart Failure, Congestive Heart Failure, Congestive Heart Failure, Hypertension, Peripheral Hypertension, Peripheral Hypertension, Peripheral [name], [name] ([id number]) Venous Disease, Type II Venous Disease, Type II Venous Disease, Type II Diabetes, Neuropathy Diabetes, Neuropathy Diabetes, Neuropathy Date Acquired: 06/11/2016 12/30/2016 12/31/2016 Weeks of Treatment: 14 0 0 Wound Status: Open Open Open Pending Amputation on Yes No No Presentation: Measurements L x W x D 1x1.4x0.1 0.6x1.7x0.1 0.5x1x0.1 (cm) Area (cm) : 1.1 0.801 0.393 Volume (cm) : 0.11 0.08 0.039 % Reduction in Area: 48.50% N/A 0.00% % Reduction in Volume: 48.60% N/A 0.00% Classification: Grade 1 Grade 1 Grade 1 Exudate Amount: Large Large Large Exudate Type: Serous Serosanguineous Serosanguineous Exudate Color: amber red, brown red, brown Foul Odor After Yes No No Cleansing: Odor Anticipated Due to No N/A N/A Product Use: Wound Margin: Flat and  Intact Distinct, outline attached Flat and Intact Granulation Amount: Medium (34-66%) Large (67-100%) Large (67-100%) Granulation Quality: Pink Red Red Necrotic Amount: Medium (34-66%) None Present (0%) None Present (0%) Exposed Structures: Fat Layer (Subcutaneous N/A N/A Tissue) Exposed: Yes Fascia: No Tendon: No Muscle: No Joint: No Bone: No Epithelialization: Small (1-33%) None None Periwound Skin Texture: Excoriation: Yes No Abnormalities Noted No Abnormalities Noted Induration: No Callus:  No Crepitus: No Rash: No Scarring: No Periwound Skin Maceration: Yes Maceration: Yes Maceration: Yes Moisture: Dry/Scaly: No Periwound Skin Color: Atrophie Blanche: No No Abnormalities Noted No Abnormalities Noted Cyanosis: No Ecchymosis: No Erythema: No Hemosiderin Staining: No Mottled: No Pallor: No Rubor: No Temperature: No Abnormality No Abnormality No Abnormality Mould, [name] ([id number]) Tenderness on No Yes Yes Palpation: Wound Preparation: Ulcer Cleansing: Ulcer Cleansing: Ulcer Cleansing: Rinsed/Irrigated with Rinsed/Irrigated with Rinsed/Irrigated with Saline Saline Saline Topical Anesthetic Topical Anesthetic Applied: None Applied: None Treatment Notes Wound #1 (Right Toe Great) 1. Cleansed with: Clean wound with Normal Saline 2. Anesthetic Topical Lidocaine 4% cream to wound bed prior to debridement 3. Peri-wound Care: Antifungal cream 4. Dressing Applied: Aquacel Ag 5. Secondary Dressing Applied Dry Gauze Kerlix/Conform 7. Secured with Tape Notes netting Wound #2 (Right Toe Second) 1. Cleansed with: Clean wound with Normal Saline 2. Anesthetic Topical Lidocaine 4% cream to wound bed prior to debridement 3. Peri-wound Care: Antifungal cream 4. Dressing Applied: Aquacel Ag 5. Secondary Dressing Applied Dry Gauze Kerlix/Conform 7. Secured with Tape Notes netting Wound #5 (Left Toe Great) 1. Cleansed with: Goodness, Lazarus E. ([id number]) Clean wound with Normal Saline 2. Anesthetic Topical Lidocaine 4% cream to wound bed prior to debridement 3. Peri-wound Care: Antifungal cream 4. Dressing Applied: Aquacel Ag 5. Secondary Dressing Applied Dry Gauze Kerlix/Conform 7. Secured with Tape Notes netting Wound #6 (Left Toe Second) 1. Cleansed with: Clean wound with Normal Saline 2. Anesthetic Topical Lidocaine 4% cream to wound bed prior to debridement 3. Peri-wound Care: Antifungal cream 4. Dressing Applied: Aquacel Ag 5.  Secondary Dressing Applied Dry Gauze Kerlix/Conform 7. Secured with Tape Notes netting Wound #8 (Left Toe Third) 1. Cleansed with: Clean wound with Normal Saline 2. Anesthetic Topical Lidocaine 4% cream to wound bed prior to debridement 3. Peri-wound Care: Antifungal cream 4. Dressing Applied: Aquacel Ag 5. Secondary Dressing Applied Dry Gauze Kerlix/Conform 7. Secured with Tape [name], [name] ([id number]) Notes netting Wound #9 (Right Toe Third) 1. Cleansed with: Clean wound with Normal Saline 2. Anesthetic Topical Lidocaine 4% cream to wound bed prior to debridement 3. Peri-wound Care: Antifungal cream 4. Dressing Applied: Aquacel Ag 5. Secondary Dressing Applied Dry Gauze Kerlix/Conform 7. Secured with Tape Notes netting Electronic Signature(s) Signed: 12/31/2016 4:01:09 PM By: Christin Fudge MD, FACS Entered By: Christin Fudge on 12/31/2016 08:41:45 Mccants, JOHNATON SONNEBORN ([id number]) -------------------------------------------------------------------------------- [place] Details Patient Name: LEVEON, PELZER 12/31/2016 8:00 Date of Service: AM Medical Record [id number] Number: Patient Account Number: [id number] Date of Birth/Sex: Jul 25, 1950 (66 y.o. Male) Treating RN: [name], Other Clinician: Primary Care Khadejah Son: [name] Treating [name], [name] Ouch, Addysin Porco/Extender: Referring Lennyn Bellanca: [name] in Treatment: 14 Active Inactive ` Abuse / Safety / Falls / Self Care Management Nursing Diagnoses: Potential for falls Goals: Patient will remain injury free related to falls Date Initiated: 09/21/2016 Target Resolution Date: 11/30/2016 Goal Status: Active Interventions: Assess fall risk on admission and as needed Notes: ` Nutrition Nursing Diagnoses: Potential for alteratiion in Nutrition/Potential for imbalanced nutrition Goals: Patient/caregiver agrees to and verbalizes understanding of need  to use nutritional supplements and/or vitamins as prescribed Date Initiated: 09/21/2016 Target Resolution Date: 11/30/2016 Goal Status: Active Interventions: Assess patient nutrition upon admission and as needed per policy Notes: ` Orientation to the [place]. ([id number]) Nursing Diagnoses: Knowledge deficit related to the wound healing center program Goals: Patient/caregiver will verbalize understanding of the [place] Date Initiated: 09/21/2016 Target Resolution Date: 11/30/2016 Goal Status: Active Interventions: Provide education on orientation to the wound center Notes: ` Wound/Skin Impairment Nursing Diagnoses: Knowledge deficit related to smoking impact on wound healing Goals: Ulcer/skin breakdown will have a volume reduction of 30% by week 4 Date Initiated: 09/21/2016 Target Resolution Date: 11/30/2016 Goal Status: Active Ulcer/skin breakdown will have a volume reduction of 50% by week 8 Date Initiated: 09/21/2016 Target Resolution Date: 11/30/2016 Goal Status: Active Ulcer/skin breakdown will have a volume reduction of 80% by week 12 Date Initiated: 09/21/2016 Target Resolution Date: 11/30/2016 Goal Status: Active Ulcer/skin breakdown will heal within 14 weeks Date Initiated: 09/21/2016 Target Resolution Date: 11/30/2016 Goal Status: Active Interventions: Assess patient/caregiver ability to obtain necessary supplies Assess patient/caregiver ability to perform ulcer/skin care regimen upon admission and as needed Assess ulceration(s) every visit Notes: Electronic Signature(s) Signed: 12/31/2016 4:26:24 PM By: [name] Entered By: [name] on 12/31/2016 08:29:39 Credit, Josearmando Johnette Abraham ([id number]) -------------------------------------------------------------------------------- Pain Assessment Details Patient Name: [name] Brittle E. 12/31/2016 8:00 Date of Service: AM Medical Record [id number] Number: Patient Account  Number: [id number] Date of Birth/Sex: [birth year] (66 y.o. Male) Treating RN: [name], Other Clinician: Primary Care Demira Gwynne: [name] Treating [name], [name] Ouch, Lennette Fader/Extender: Referring Euna Armon: [name] in Treatment: 14 Active Problems Location of Pain Severity and Description of Pain Patient Has Paino No Site Locations Pain Management and Medication Current Pain Management: Electronic Signature(s) Signed: 12/31/2016 4:26:24 PM By: [name] Entered By: [name] on 12/31/2016 08:15:26 Cephas, [name] ([id number]) -------------------------------------------------------------------------------- Patient/Caregiver Education Details Patient Name: [name] 12/31/2016 8:00 Date of Service: AM Medical Record [id number] Number: Patient Account Number: [id number] Date of Birth/Gender: 01/16/1951 (66 y.o. Male) Treating RN: [name], Other Clinician: Primary Care Physician: [name] Treating [name], [name] Ouch, Physician/Extender: Referring Physician: Harless [name] in Treatment: 14 Education Assessment Education Provided To: Patient Education Topics Provided Wound/Skin Impairment: Handouts: Other: change dressing as ordered Methods: Demonstration, Explain/Verbal Responses: State content correctly Electronic Signature(s) Signed: 12/31/2016 4:26:24 PM By: [name] Entered By: [name] on 12/31/2016 08:30:21 [place]en, [name] ([id number]) -------------------------------------------------------------------------------- Wound Assessment Details Patient Name: [name] Brittle E. 12/31/2016 8:00 Date of Service: AM Medical Record [id number] Number: Patient Account Number: [id number] Date of Birth/Sex: 10/10/1950 (66 y.o. Male) Treating RN: [name], Other Clinician: Primary Care Raymond [name]: [name] Treating [name], [name] Ouch,  Brek Reece/Extender: Referring Tikia Skilton: [name] in Treatment: 14 Wound Status Wound Number: 1 Primary Diabetic Wound/Ulcer of the Lower Etiology: Extremity Wound Location: Right Toe Great Wound Open Wounding Event: Gradually Appeared Status: Date Acquired: 06/11/2016 Comorbid Anemia, Lymphedema, Congestive Weeks Of Treatment: 14 History: Heart Failure, Hypertension, Peripheral Clustered Wound: No Venous Disease, Type II Diabetes, Pending Amputation On Presentation Neuropathy Photos Photo Uploaded By: [name] on 12/31/2016 09:11:48 Wound Measurements Length: (cm) 1.8 Width: (cm) 6 Depth: (cm) 0.2 Area: (cm) 8.482 Volume: (cm) 1.696 % Reduction in Area: 82% % Reduction in Volume: 64% Epithelialization: Large (67-100%) Tunneling: No Undermining: No Wound Description Classification: Grade 1 Foul Odor Afte Wound Margin: Flat and Intact Due to Product Exudate Amount: Large Slough/Fibrino Exudate Type: Serosanguineous Exudate Color: red, brown r  Cleansing: Yes Use: No Yes Wound Bed Mittleman, Blanton E. ([id number]) Granulation Amount: Medium (34-66%) Exposed Structure Granulation Quality: Pink Fascia Exposed: No Necrotic Amount: Medium (34-66%) Fat Layer (Subcutaneous Tissue) Exposed: Yes Necrotic Quality: Adherent Slough Tendon Exposed: No Muscle Exposed: No Joint Exposed: No Bone Exposed: No Periwound Skin Texture Texture Color No Abnormalities Noted: No No Abnormalities Noted: No Callus: No Atrophie Blanche: No Crepitus: No Cyanosis: No Excoriation: Yes Ecchymosis: No Induration: No Erythema: No Rash: No Hemosiderin Staining: No Scarring: No Mottled: No Pallor: No Moisture Rubor: No No Abnormalities Noted: No Dry / Scaly: No Temperature / Pain Maceration: Yes Temperature: No Abnormality Wound Preparation Ulcer Cleansing: Rinsed/Irrigated with Saline Topical Anesthetic Applied: None Treatment Notes Wound #1 (Right Toe Great) 1.  Cleansed with: Clean wound with Normal Saline 2. Anesthetic Topical Lidocaine 4% cream to wound bed prior to debridement 3. Peri-wound Care: Antifungal cream 4. Dressing Applied: Aquacel Ag 5. Secondary Dressing Applied Dry Gauze Kerlix/Conform 7. Secured with Tape Notes netting Electronic Signature(s) Signed: 12/31/2016 4:26:24 PM By: Warnell Bureau ([id number]) Entered By: [name] on 12/31/2016 08:24:03 [name], [name] ([id number]) -------------------------------------------------------------------------------- Wound Assessment Details Patient Name: FREDERICK, MARRO 12/31/2016 8:00 Date of Service: AM Medical Record [id number] Number: Patient Account Number: [id number] Date of Birth/Sex: June 17, 1950 (66 y.o. Male) Treating RN: [name], Other Clinician: Primary Care Sebasthian Stailey: [name] Treating [name], [name] Ouch, Zehava Turski/Extender: Referring Altie Savard: [name] in Treatment: 14 Wound Status Wound Number: 2 Primary Diabetic Wound/Ulcer of the Lower Etiology: Extremity Wound Location: Right Toe Second Wound Open Wounding Event: Gradually Appeared Status: Date Acquired: 06/11/2016 Comorbid Anemia, Lymphedema, Congestive Weeks Of Treatment: 14 History: Heart Failure, Hypertension, Peripheral Clustered Wound: No Venous Disease, Type II Diabetes, Pending Amputation On Presentation Neuropathy Photos Photo Uploaded By: [name] on 12/31/2016 09:12:24 Wound Measurements Length: (cm) 1.4 Width: (cm) 1.8 Depth: (cm) 0.1 Area: (cm) 1.979 Volume: (cm) 0.198 % Reduction in Area: 35.6% % Reduction in Volume: 35.5% Epithelialization: Small (1-33%) Tunneling: No Undermining: No Wound Description Classification: Grade 1 Foul Odor Afte Wound Margin: Flat and Intact Due to Product Exudate Amount: Large Slough/Fibrino Exudate Type: Serosanguineous Exudate Color: red, brown r Cleansing: Yes Use:  No Yes Wound Bed Jallow, Alverto E. ([id number]) Granulation Amount: Large (67-100%) Exposed Structure Granulation Quality: Pink Fascia Exposed: No Necrotic Amount: None Present (0%) Fat Layer (Subcutaneous Tissue) Exposed: Yes Tendon Exposed: No Muscle Exposed: No Joint Exposed: No Bone Exposed: No Periwound Skin Texture Texture Color No Abnormalities Noted: No No Abnormalities Noted: No Callus: No Atrophie Blanche: No Crepitus: No Cyanosis: No Excoriation: Yes Ecchymosis: No Induration: No Erythema: No Rash: No Hemosiderin Staining: No Scarring: No Mottled: No Pallor: No Moisture Rubor: No No Abnormalities Noted: No Dry / Scaly: No Temperature / Pain Maceration: Yes Temperature: No Abnormality Wound Preparation Ulcer Cleansing: Rinsed/Irrigated with Saline Topical Anesthetic Applied: None Treatment Notes Wound #2 (Right Toe Second) 1. Cleansed with: Clean wound with Normal Saline 2. Anesthetic Topical Lidocaine 4% cream to wound bed prior to debridement 3. Peri-wound Care: Antifungal cream 4. Dressing Applied: Aquacel Ag 5. Secondary Dressing Applied Dry Gauze Kerlix/Conform 7. Secured with Tape Notes netting Electronic Signature(s) Signed: 12/31/2016 4:26:24 PM By: Warnell Bureau ([id number]) Entered By: [name] on 12/31/2016 08:24:43 [name], [name] ([id number]) -------------------------------------------------------------------------------- Wound Assessment Details Patient Name: WHITTEN, ANDREONI 12/31/2016 8:00 Date of Service: AM Medical Record [id number] Number: Patient Account Number: [id number] Date of Birth/Sex: 04-09-1951 (66 y.o. Male) Treating RN: [name],  Other Clinician: Primary Care Chayanne Filippi: [name] Treating [name], [name] Ouch, Aaric Dolph/Extender: Referring Lendell Gallick: [name] in Treatment: 14 Wound Status Wound Number: 5 Primary Diabetic Wound/Ulcer of the  Lower Etiology: Extremity Wound Location: Left Toe Great Wound Open Wounding Event: Gradually Appeared Status: Date Acquired: 06/11/2016 Comorbid Anemia, Lymphedema, Congestive Weeks Of Treatment: 14 History: Heart Failure, Hypertension, Peripheral Clustered Wound: No Venous Disease, Type II Diabetes, Pending Amputation On Presentation Neuropathy Photos Photo Uploaded By: [name] on 12/31/2016 09:12:24 Wound Measurements Length: (cm) 0.8 Width: (cm) 1.2 Depth: (cm) 0.1 Area: (cm) 0.754 Volume: (cm) 0.075 % Reduction in Area: 94.7% % Reduction in Volume: 94.8% Epithelialization: Small (1-33%) Tunneling: No Undermining: No Wound Description Classification: Grade 1 Foul Odor After Wound Margin: Flat and Intact Due to Product Exudate Amount: Large Slough/Fibrino Exudate Type: Serous Exudate Color: amber Cleansing: Yes Use: No Yes Wound Bed [name], [name] ([id number]) Granulation Amount: Large (67-100%) Exposed Structure Granulation Quality: Pink Fascia Exposed: No Necrotic Amount: Small (1-33%) Fat Layer (Subcutaneous Tissue) Exposed: Yes Necrotic Quality: Adherent Slough Tendon Exposed: No Muscle Exposed: No Joint Exposed: No Bone Exposed: No Periwound Skin Texture Texture Color No Abnormalities Noted: No No Abnormalities Noted: No Callus: No Atrophie Blanche: No Crepitus: No Cyanosis: No Excoriation: Yes Ecchymosis: No Induration: No Erythema: No Rash: No Hemosiderin Staining: No Scarring: No Mottled: No Pallor: No Moisture Rubor: No No Abnormalities Noted: No Dry / Scaly: No Temperature / Pain Maceration: No Temperature: No Abnormality Wound Preparation Ulcer Cleansing: Rinsed/Irrigated with Saline Topical Anesthetic Applied: None Treatment Notes Wound #5 (Left Toe Great) 1. Cleansed with: Clean wound with Normal Saline 2. Anesthetic Topical Lidocaine 4% cream to wound bed prior to debridement 3. Peri-wound Care: Antifungal  cream 4. Dressing Applied: Aquacel Ag 5. Secondary Dressing Applied Dry Gauze Kerlix/Conform 7. Secured with Tape Notes netting Electronic Signature(s) Signed: 12/31/2016 4:26:24 PM By: Warnell Bureau ([id number]) Entered By: [name] on 12/31/2016 08:25:26 [name], [name] ([id number]) -------------------------------------------------------------------------------- Wound Assessment Details Patient Name: AABAN, GRIEP 12/31/2016 8:00 Date of Service: AM Medical Record [id number] Number: Patient Account Number: [id number] Date of Birth/Sex: 06/23/1950 (66 y.o. Male) Treating RN: [name], Other Clinician: Primary Care Laylah Riga: [name] Treating [name], [name] Ouch, Michaelann Gunnoe/Extender: Referring Laura Caldas: [name] in Treatment: 14 Wound Status Wound Number: 6 Primary Diabetic Wound/Ulcer of the Lower Etiology: Extremity Wound Location: Left Toe Second Wound Open Wounding Event: Gradually Appeared Status: Date Acquired: 06/11/2016 Comorbid Anemia, Lymphedema, Congestive Weeks Of Treatment: 14 History: Heart Failure, Hypertension, Peripheral Clustered Wound: No Venous Disease, Type II Diabetes, Pending Amputation On Presentation Neuropathy Photos Photo Uploaded By: [name] on 12/31/2016 09:13:36 Wound Measurements Length: (cm) 1 Width: (cm) 1.4 Depth: (cm) 0.1 Area: (cm) 1.1 Volume: (cm) 0.11 % Reduction in Area: 48.5% % Reduction in Volume: 48.6% Epithelialization: Small (1-33%) Tunneling: No Undermining: No Wound Description Classification: Grade 1 Foul Odor After Wound Margin: Flat and Intact Due to Product Exudate Amount: Large Slough/Fibrino Exudate Type: Serous Exudate Color: amber Cleansing: Yes Use: No Yes Wound Bed Osmanovic, Braeton E. ([id number]) Granulation Amount: Medium (34-66%) Exposed Structure Granulation Quality: Pink Fascia Exposed: No Necrotic Amount: Medium  (34-66%) Fat Layer (Subcutaneous Tissue) Exposed: Yes Necrotic Quality: Adherent Slough Tendon Exposed: No Muscle Exposed: No Joint Exposed: No Bone Exposed: No Periwound Skin Texture Texture Color No Abnormalities Noted: No No Abnormalities Noted: No Callus: No Atrophie Blanche: No Crepitus: No Cyanosis: No Excoriation: Yes Ecchymosis: No Induration: No Erythema: No Rash: No Hemosiderin Staining: No Scarring: No  Mottled: No Pallor: No Moisture Rubor: No No Abnormalities Noted: No Dry / Scaly: No Temperature / Pain Maceration: Yes Temperature: No Abnormality Wound Preparation Ulcer Cleansing: Rinsed/Irrigated with Saline Topical Anesthetic Applied: None Treatment Notes Wound #6 (Left Toe Second) 1. Cleansed with: Clean wound with Normal Saline 2. Anesthetic Topical Lidocaine 4% cream to wound bed prior to debridement 3. Peri-wound Care: Antifungal cream 4. Dressing Applied: Aquacel Ag 5. Secondary Dressing Applied Dry Gauze Kerlix/Conform 7. Secured with Tape Notes netting Electronic Signature(s) Signed: 12/31/2016 4:26:24 PM By: Warnell Bureau ([id number]) Entered By: [name] on 12/31/2016 08:25:50 Ion, [name] ([id number]) -------------------------------------------------------------------------------- Wound Assessment Details Patient Name: COBIN, CADAVID 12/31/2016 8:00 Date of Service: AM Medical Record [id number] Number: Patient Account Number: [id number] Date of Birth/Sex: 06-24-1950 (66 y.o. Male) Treating RN: [name], Other Clinician: Primary Care Karmine Kauer: [name] Treating [name], [name] Ouch, Jalaysia Lobb/Extender: Referring Lorain Fettes: [name] in Treatment: 14 Wound Status Wound Number: 8 Primary Diabetic Wound/Ulcer of the Lower Etiology: Extremity Wound Location: Left Toe Third Wound Open Wounding Event: Gradually Appeared Status: Date Acquired: 12/30/2016 Comorbid  Anemia, Lymphedema, Congestive Weeks Of Treatment: 0 History: Heart Failure, Hypertension, Peripheral Clustered Wound: No Venous Disease, Type II Diabetes, Neuropathy Photos Photo Uploaded By: [name] on 12/31/2016 09:13:37 Wound Measurements Length: (cm) 0.6 % Reduction in Width: (cm) 1.7 % Reduction in Depth: (cm) 0.1 Epithelializati Area: (cm) 0.801 Tunneling: Volume: (cm) 0.08 Undermining: Area: Volume: on: None No No Wound Description Classification: Grade 1 Foul Odor Afte Wound Margin: Distinct, outline attached Slough/Fibrino Exudate Amount: Large Exudate Type: Serosanguineous Exudate Color: red, brown r Cleansing: No Yes Wound Bed Pickert, Jaxtyn E. ([id number]) Granulation Amount: Large (67-100%) Granulation Quality: Red Necrotic Amount: None Present (0%) Periwound Skin Texture Texture Color No Abnormalities Noted: No No Abnormalities Noted: No Moisture Temperature / Pain No Abnormalities Noted: No Temperature: No Abnormality Maceration: Yes Tenderness on Palpation: Yes Wound Preparation Ulcer Cleansing: Rinsed/Irrigated with Saline Treatment Notes Wound #8 (Left Toe Third) 1. Cleansed with: Clean wound with Normal Saline 2. Anesthetic Topical Lidocaine 4% cream to wound bed prior to debridement 3. Peri-wound Care: Antifungal cream 4. Dressing Applied: Aquacel Ag 5. Secondary Dressing Applied Dry Gauze Kerlix/Conform 7. Secured with Tape Notes netting Electronic Signature(s) Signed: 12/31/2016 4:26:24 PM By: [name] Entered By: [name] on 12/31/2016 08:26:16 [name], [name] ([id number]) -------------------------------------------------------------------------------- Wound Assessment Details Patient Name: VIR, WHETSTINE 12/31/2016 8:00 Date of Service: AM Medical Record [id number] Number: Patient Account Number: [id number] Date of Birth/Sex: 1951/03/26 (66 y.o. Male) Treating RN: [name], Other Clinician: Primary Care Labresha Mellor: [name] Treating [name], [name] Ouch, Jacson Rapaport/Extender: Referring Dabid Godown: [name] in Treatment: 14 Wound Status Wound Number: 9 Primary Diabetic Wound/Ulcer of the Lower Etiology: Extremity Wound Location: Right Toe Third Wound Open Wounding Event: Gradually Appeared Status: Date Acquired: 12/31/2016 Comorbid Anemia, Lymphedema, Congestive Weeks Of Treatment: 0 History: Heart Failure, Hypertension, Peripheral Clustered Wound: No Venous Disease, Type II Diabetes, Neuropathy Photos Photo Uploaded By: [name] on 12/31/2016 09:14:00 Wound Measurements Length: (cm) 0.5 % Reduction in Width: (cm) 1 % Reduction in Depth: (cm) 0.1 Epithelializati Area: (cm) 0.393 Tunneling: Volume: (cm) 0.039 Undermining: Area: 0% Volume: 0% on: None No No Wound Description Classification: Grade 1 Foul Odor Afte Wound Margin: Flat and Intact Slough/Fibrino Exudate Amount: Large Exudate Type: Serosanguineous Exudate Color: red, brown r Cleansing: No Yes Wound Bed Kiger, Nassir E. ([id number]) Granulation Amount: Large (67-100%) Granulation Quality: Red Necrotic Amount: None Present (0%) Periwound  Skin Texture Texture Color No Abnormalities Noted: No No Abnormalities Noted: No Moisture Temperature / Pain No Abnormalities Noted: No Temperature: No Abnormality Maceration: Yes Tenderness on Palpation: Yes Wound Preparation Ulcer Cleansing: Rinsed/Irrigated with Saline Topical Anesthetic Applied: None Treatment Notes Wound #9 (Right Toe Third) 1. Cleansed with: Clean wound with Normal Saline 2. Anesthetic Topical Lidocaine 4% cream to wound bed prior to debridement 3. Peri-wound Care: Antifungal cream 4. Dressing Applied: Aquacel Ag 5. Secondary Dressing Applied Dry Gauze Kerlix/Conform 7. Secured with Tape Notes netting Electronic Signature(s) Signed: 12/31/2016 4:26:24 PM By:  [name] Entered By: [name] on 12/31/2016 08:26:41 Klosinski, ENRIQUE MANGANARO ([id number]) -------------------------------------------------------------------------------- Vitals Details Patient Name: CORBIN, FALCK 12/31/2016 8:00 Date of Service: AM Medical Record [id number] Number: Patient Account Number: [id number] Date of Birth/Sex: [birth year] (66 y.o. Male) Treating RN: [name], Other Clinician: Primary Care Winnifred Dufford: [name] Treating [name], [name] Ouch, Si Jachim/Extender: Referring Rihanna Marseille: [name] in Treatment: 14 Vital Signs Time Taken: 08:15 Temperature (F): 98.2 Height (in): 69 Pulse (bpm): 67 Weight (lbs): 168 Respiratory Rate (breaths/min): 16 Body Mass Index (BMI): 24.8 Blood Pressure (mmHg): 160/78 Reference Range: 80 - 120 mg / dl Electronic Signature(s) Signed: 12/31/2016 4:26:24 PM By: [name] Entered By: [name] on 12/31/2016 08:17:38

## 2017-01-01 NOTE — Progress Notes (Signed)
[name], [name] ([id number]) Visit Report for 12/31/2016 Chief Complaint Document Details Patient Name: [name], [name] 12/31/2016 8:00 Date of Service: AM Medical Record [id number] Number: Patient Account Number: [id number] Date of Birth/Sex: July 01, 1950 (66 y.o. Male) Treating RN: Anastasio Champion, Other Clinician: Primary Care Provider: Olivia Mackie Treating Khaden Gater, Cherie Ouch, Provider/Extender: Referring Provider: Harless Nakayama in Treatment: 14 Information Obtained from: Patient Chief Complaint Patients presents for treatment of an open diabetic ulcer to both feet Electronic Signature(s) Signed: 12/31/2016 4:01:09 PM By: Christin Fudge MD, FACS Entered By: Christin Fudge on 12/31/2016 08:41:52 Bayliss, SHAHIEM BEDWELL ([id number]) -------------------------------------------------------------------------------- HPI Details Patient Name: [name], [name] 12/31/2016 8:00 Date of Service: AM Medical Record [id number] Number: Patient Account Number: [id number] Date of Birth/Sex: 1950-04-28 (66 y.o. Male) Treating RN: Anastasio Champion, Other Clinician: Primary Care Provider: Olivia Mackie Treating Dorrien Grunder, Cherie Ouch, Provider/Extender: Referring Provider: Harless Nakayama in Treatment: 14 History of Present Illness Location: bilateral feet ulceration on the toes Quality: Patient reports experiencing a dull pain to affected area(s). Severity: Patient states wound are getting better Duration: Patient has had the wound for > 3 months prior to seeking treatment at the wound center Timing: Pain in wound is constant (hurts all the time) Context: The wound would happen gradually Modifying Factors: Other treatment(s) tried include:treatment for lymphedema and is seen by the podiatrist Dr. Caryl Comes Associated Signs and Symptoms: Patient reports having increase swelling. HPI Description: 66 year old patient here to see as for bilateral feet ulceration to on his left  first and second toe and 2 on his right first and second toe, which she's had for about 4 months. 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 Donella Stade. The patient is also undergoing workup by medical oncology for a intra-abdominal lymphadenopathy. 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 He is also status post appendectomy, inguinal lymph node biopsy, rectal biopsy and rectal examination under anesthesia. he currently smokes cigarettes about half packet a day. In March of this year he was seen by Dr. Hortencia Pilar, for evaluation of bilateral varicose veins and besides wearing compression stockings he had recommended laser ablation of the right and left great saphenous veins to eleviate the symptoms and complications of severe superficial venous reflux disease. He also recommended lymphedema pumps for better control of his lymphedema. The patient recently has had on 08/23/2016, right greater saphenous vein ablation with the laser energy Earlier lower extremity venous reflux examination done on 05/08/2016 showed no DVT or SVT both lower legs but incompetence of bilateral great saphenous veins was present. A lower arterial study was also done and there was no significant right lower and left lower extremity problems based on a normal toe brachial index bilaterally and the ABI was 1.21 the left and 1.23 on the right. His post ablation venous duplex examination showed successful ablation of the right GS vein with thrombus formation 2 below the right saphenofemoral junction. The deep system was patent without evidence of thrombosis and this was done on 08/30/2016. the patient also has a squamous cell cancer of the skin of the buttock and is recently  undergone radiation therapy for this prior to excisional surgery. Addendum: regarding his x-rays done today and x-ray of the left foot -- IMPRESSION: No objective Mitten, [name] ([id number]) evidence of osteomyelitis.  There are soft tissue changes which may reflect cellulitis. X-ray of the right foot -- IMPRESSION:Findings compatible with cellulitis of the toes. No objective evidence of osteomyelitis is observed. 10/01/16 on evaluation today patient's wounds appeared to be doing some better. I did review the x-rays as well which showed no evidence of osteomyelitis although there was evidence on x-ray of cellulitis. He fortunately is not having any discomfort although he continues to have some swelling. He does not remember being on any antibiotics recently. 10/15/16 on evaluation today patient's wounds overall appear to be doing better although he does have a new location noted on the left foot. Fortunately he is not having significant pain. It almost has the appearance that something is rubbing on the end of his toes but he wears the open toe shoes and according to what he is telling me never wears anything that would rub on his foot. There is no evidence of infection and specifically no evidence of a fungal infection 10/22/16 On evaluation today patient's wounds appeared to be doing better compared to last week in regard to his bilateral lower extremities. Fortunately I happy with how things are progressing although he still has ulcers I feel like that he is improving and appropriate manner. 11/12/16 on evaluation today patient appears to be doing well in regard to his bilateral feet and the respective wounds. We have been using surrounding her dressings along with an antifungal cream which seems to be doing very well. He has no bilateral dysfunction noticed that the rituals are weight loss at this point. He also has no nausea or vomiting a note purulent discharge. He did see Vein and vascular  today and he tells me that they told him he could have surgery for his venous stasis but they did not feel like it was worth it in his words. Fortunately patient's wounds do appear to be getting sneakily better. 11/26/2016 -- he says he is going to have some surgery during this week at [facility] for possibly a colon resection. 12/31/2016 -- the patient has been noncompliant with his smoking and I'm not sure whether he is also started drinking again. He continues to be very nonchalant about his care Electronic Signature(s) Signed: 12/31/2016 4:01:09 PM By: Christin Fudge MD, FACS Entered By: Christin Fudge on 12/31/2016 08:43:20 Payton, [name] ([id number]) -------------------------------------------------------------------------------- Physical Exam Details Patient Name: ARLAND, USERY 12/31/2016 8:00 Date of Service: AM Medical Record [id number] Number: Patient Account Number: [id number] Date of Birth/Sex: [birth year] (66 y.o. Male) Treating RN: Anastasio Champion, Other Clinician: Primary Care Provider: Olivia Mackie Treating Sylver Vantassell, Cherie Ouch, Provider/Extender: Referring Provider: Harless Nakayama in Treatment: 14 Constitutional . Pulse regular. Respirations normal and unlabored. Afebrile. . Eyes Nonicteric. Reactive to light. Ears, Nose, Mouth, and Throat Lips, teeth, and gums WNL.Marland Kitchen Moist mucosa without lesions. Neck supple and nontender. No palpable supraclavicular or cervical adenopathy. Normal sized without goiter. Respiratory WNL. No retractions.. Cardiovascular Pedal Pulses WNL. No clubbing, cyanosis or edema. Lymphatic No adneopathy. No adenopathy. No adenopathy. Musculoskeletal Adexa without tenderness or enlargement.. Digits and nails w/o clubbing, cyanosis, infection, petechiae, ischemia, or inflammatory conditions.. Integumentary (Hair, Skin) No suspicious lesions. No crepitus or fluctuance. No peri-wound warmth or erythema. No  masses.Marland Kitchen Psychiatric Judgement and insight Intact.. No evidence of depression, anxiety, or agitation.. Notes there had been some improvement of his wounds but now these have been persisting for a while and the sharp debridement was not required they're quite moist and there is fungal infection again. Electronic Signature(s)  Signed: 12/31/2016 4:01:09 PM By: Christin Fudge MD, FACS Entered By: Christin Fudge on 12/31/2016 08:43:55 Konicek, [name] ([id number]) -------------------------------------------------------------------------------- Physician Orders Details Patient Name: DEONTRAY, HUNNICUTT 12/31/2016 8:00 Date of Service: AM Medical Record [id number] Number: Patient Account Number: [id number] Date of Birth/Sex: [birth year] (66 y.o. Male) Treating RN: Anastasio Champion, Other Clinician: Primary Care Provider: Olivia Mackie Treating Irvin Lizama, Cherie Ouch, Provider/Extender: Referring Provider: Harless Nakayama in Treatment: 72 Verbal / Phone Orders: Yes Clinician: Carolyne Fiscal, Debi Read Back and Verified: Yes Diagnosis Coding Wound Cleansing Wound #1 Right Toe Great o Cleanse wound with mild soap and water o May Shower, gently pat wound dry prior to applying new dressing. Wound #2 Right Toe Second o Cleanse wound with mild soap and water o May Shower, gently pat wound dry prior to applying new dressing. Wound #5 Left Toe Great o Cleanse wound with mild soap and water o May Shower, gently pat wound dry prior to applying new dressing. Wound #6 Left Toe Second o Cleanse wound with mild soap and water o May Shower, gently pat wound dry prior to applying new dressing. Anesthetic Wound #1 Right Toe Great o Topical Lidocaine 4% cream applied to wound bed prior to debridement Wound #2 Right Toe Second o Topical Lidocaine 4% cream applied to wound bed prior to debridement Wound #5 Left Toe Great o Topical Lidocaine 4% cream applied to wound bed prior to  debridement Wound #6 Left Toe Second o Topical Lidocaine 4% cream applied to wound bed prior to debridement Skin Barriers/Peri-Wound Care Wound #1 Right Toe Great o Antifungal cream - on and between toes then AqAg over areas Hotard, Tye E. ([id number]) Wound #2 Right Toe Second o Antifungal cream - on and between toes then AqAg over areas Wound #5 Left Toe Great o Antifungal cream - on and between toes then AqAg over areas Wound #6 Left Toe Second o Antifungal cream - on and between toes then AqAg over areas Primary Wound Dressing Wound #1 Right Toe Great o Aquacel Ag Wound #2 Right Toe Second o Aquacel Ag Wound #5 Left Toe Great o Aquacel Ag Wound #6 Left Toe Second o Aquacel Ag Secondary Dressing Wound #1 Right Toe Great o ABD pad o Gauze and Kerlix/Conform Wound #2 Right Toe Second o ABD pad o Gauze and Kerlix/Conform Wound #5 Left Toe Great o ABD pad o Gauze and Kerlix/Conform Wound #6 Left Toe Second o ABD pad o Gauze and Kerlix/Conform Dressing Change Frequency Wound #1 Right Toe Great o Change dressing every day. Wound #2 Right Toe Second o Change dressing every day. Wound #5 Left [address] Kross E. ([id number]) o Change dressing every day. Wound #6 Left Toe Second o Change dressing every day. Follow-up Appointments Wound #1 Right Toe Great o Return Appointment in 1 week. Wound #2 Right Toe Second o Return Appointment in 1 week. Wound #5 Left Toe Great o Return Appointment in 1 week. Wound #6 Left Toe Second o Return Appointment in 1 week. Edema Control Wound #1 Right Toe Great o Patient to wear own compression stockings o Elevate legs to the level of the heart and pump ankles as often as possible Wound #2 Right Toe Second o Patient to wear own compression stockings o Elevate legs to the level of the heart and pump ankles as often as possible Wound #5 Left Toe Great o Patient to wear  own compression stockings o Elevate legs to the level of the heart and pump ankles as often as possible  Wound #6 Left Toe Second o Patient to wear own compression stockings o Elevate legs to the level of the heart and pump ankles as often as possible Additional Orders / Instructions Wound #1 Right Toe Great o Stop Smoking - Please continue to stay away from alcohol o Other: - Please add vitamin A, vitamin C, and Zinc supplements to your diet Wound #2 Right Toe Second o Stop Smoking - Please continue to stay away from alcohol o Other: - Please add vitamin A, vitamin C, and Zinc supplements to your diet Wound #5 Left Toe Great o Stop Smoking - Please continue to stay away from alcohol o Other: - Please add vitamin A, vitamin C, and Zinc supplements to your diet Snee, Azrael E. ([id number]) Wound #6 Left Toe Second o Stop Smoking - Please continue to stay away from alcohol o Other: - Please add vitamin A, vitamin C, and Zinc supplements to your diet Electronic Signature(s) Signed: 12/31/2016 4:01:09 PM By: Christin Fudge MD, FACS Signed: 12/31/2016 4:26:24 PM By: Alric Quan Entered By: Alric Quan on 12/31/2016 08:30:57 Casto, [name] ([id number]) -------------------------------------------------------------------------------- Problem List Details Patient Name: TOBE, KERVIN 12/31/2016 8:00 Date of Service: AM Medical Record [id number] Number: Patient Account Number: [id number] Date of Birth/Sex: 05-22-1950 (66 y.o. Male) Treating RN: Anastasio Champion, Other Clinician: Primary Care Provider: Olivia Mackie Treating Dashanique Brownstein, Cherie Ouch, Provider/Extender: Referring Provider: Harless Nakayama in Treatment: 14 Active Problems ICD-10 Encounter Code Description Active Date Diagnosis E11.621 Type 2 diabetes mellitus with foot ulcer 09/21/2016 Yes I87.313 Chronic venous hypertension (idiopathic) with ulcer of 09/21/2016 Yes bilateral lower  extremity I89.0 Lymphedema, not elsewhere classified 09/21/2016 Yes L97.522 Non-pressure chronic ulcer of other part of left foot with fat 09/21/2016 Yes layer exposed L97.512 Non-pressure chronic ulcer of other part of right foot with 09/21/2016 Yes fat layer exposed F17.218 Nicotine dependence, cigarettes, 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 12/31/2016 8:00 Date of Service: AM Medical Record [id number] Number: Patient Account Number: [id number] Date of Birth/Sex: Sep 11, 1950 (66 y.o. Male) Treating RN: Anastasio Champion, Other Clinician: Primary Care Provider: Olivia Mackie Treating Javonta Gronau, Cherie Ouch, Provider/Extender: Referring Provider: Harless Nakayama in Treatment: 14 Subjective Chief Complaint Information obtained from Patient Patients presents for treatment of an open diabetic ulcer to both feet History of Present Illness (HPI) The following HPI elements were documented for the patient's wound: Location: bilateral feet ulceration on the toes Quality: Patient reports experiencing a dull pain to affected area(s). Severity: Patient states wound are getting better Duration: Patient has had the wound for > 3 months prior to seeking treatment at the wound center Timing: Pain in wound is constant (hurts all the time) Context: The wound would happen gradually Modifying Factors: Other treatment(s) tried include:treatment for lymphedema and is seen by the podiatrist Dr. Caryl Comes Associated Signs and Symptoms: Patient reports  having increase swelling. 66 year old patient here to see as for bilateral feet ulceration to on his left first and second toe and 2 on his right first and second toe, which she's had for about 4 months. 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 Donella Stade. The patient is also undergoing workup by medical oncology for a intra- abdominal lymphadenopathy. 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 He is also status post appendectomy, inguinal lymph node biopsy, rectal biopsy and rectal examination under anesthesia. he currently smokes cigarettes about half packet a day. In March of this year he was seen by Dr. Hortencia Pilar, for evaluation of bilateral varicose veins and besides wearing compression stockings he had recommended laser ablation of the right and left great saphenous veins to eleviate the symptoms and complications of severe superficial venous reflux disease. He also recommended lymphedema pumps for better control of his lymphedema. The patient recently has had on 08/23/2016, right greater saphenous vein ablation with the laser energy Earlier lower extremity venous reflux examination done on 05/08/2016 showed no DVT or SVT both lower legs but incompetence of bilateral great saphenous veins was present. A lower arterial study was also done and there was no significant right lower and left lower extremity problems based on a normal toe brachial index bilaterally and the ABI was 1.21 the left and 1.23 on the Rhinehart, Jkwon E. ([id number]) right. His post ablation venous duplex examination showed successful ablation of the right GS vein with thrombus formation 2 below the right saphenofemoral junction. The deep system was patent without evidence  of thrombosis and this was done on 08/30/2016. 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 Addendum: regarding his x-rays done today and x-ray of the left foot -- IMPRESSION: No objective evidence of osteomyelitis. There are soft tissue changes which may reflect cellulitis. X-ray of the right foot -- IMPRESSION:Findings compatible with cellulitis of the toes. No objective evidence of osteomyelitis is observed. 10/01/16 on evaluation today patient's wounds appeared to be doing some better. I did review the x-rays as well which showed no evidence of osteomyelitis although there was evidence on x-ray of cellulitis. He fortunately is not having any discomfort although he continues to have some swelling. He does not remember being on any antibiotics recently. 10/15/16 on evaluation today patient's wounds overall appear to be doing better although he does have a new location noted on the left foot. Fortunately he is not having significant pain. It almost has the appearance that something is rubbing on the end of his toes but he wears the open toe shoes and according to what he is telling me never wears anything that would rub on his foot. There is no evidence of infection and specifically no evidence of a fungal infection 10/22/16 On evaluation today patient's wounds appeared to be doing better compared to last week in regard to his bilateral lower extremities. Fortunately I happy with how things are progressing although he still has ulcers I feel like that he is improving and appropriate manner. 11/12/16 on evaluation today patient appears to be doing well in regard to his bilateral feet and the respective wounds. We have been using surrounding her dressings along with an antifungal cream which seems to be doing very well. He has no bilateral dysfunction noticed that the rituals are weight loss at  this point. He also has no nausea or vomiting a note purulent discharge. He did see Vein and vascular today and he tells me that they told him he could have surgery for his venous stasis but they did not  feel like it was worth it in his words. Fortunately patient's wounds do appear to be getting sneakily better. 11/26/2016 -- he says he is going to have some surgery during this week at [facility] for possibly a colon resection. 12/31/2016 -- the patient has been noncompliant with his smoking and I'm not sure whether he is also started drinking again. He continues to be very nonchalant about his care Objective Bierlein, Yamin E. ([id number]) Constitutional Pulse regular. Respirations normal and unlabored. Afebrile. Vitals Time Taken: 8:15 AM, Height: 69 in, Weight: 168 lbs, BMI: 24.8, Temperature: 98.2 F, Pulse: 67 bpm, Respiratory Rate: 16 breaths/min, Blood Pressure: 160/78 mmHg. Eyes Nonicteric. Reactive to light. Ears, Nose, Mouth, and Throat Lips, teeth, and gums WNL.Marland Kitchen Moist mucosa without lesions. Neck supple and nontender. No palpable supraclavicular or cervical adenopathy. Normal sized without goiter. Respiratory WNL. No retractions.. Cardiovascular Pedal Pulses WNL. No clubbing, cyanosis or edema. Lymphatic No adneopathy. No adenopathy. No adenopathy. Musculoskeletal Adexa without tenderness or enlargement.. Digits and nails w/o clubbing, cyanosis, infection, petechiae, ischemia, or inflammatory conditions.Marland Kitchen Psychiatric Judgement and insight Intact.. No evidence of depression, anxiety, or agitation.. General Notes: there had been some improvement of his wounds but now these have been persisting for a while and the sharp debridement was not required they're quite moist and there is fungal infection again. Integumentary (Hair, Skin) No suspicious lesions. No crepitus or fluctuance. No peri-wound warmth or erythema. No masses.. Wound #1 status is Open. Original cause of wound  was Gradually Appeared. The wound is located on the Right Toe Great. The wound measures 1.8cm length x 6cm width x 0.2cm depth; 8.482cm^2 area and 1.696cm^3 volume. There is Fat Layer (Subcutaneous Tissue) Exposed exposed. There is no tunneling or undermining noted. There is a large amount of serosanguineous drainage noted. Foul odor after cleansing was noted. The wound margin is flat and intact. There is medium (34-66%) pink granulation within the wound bed. There is a medium (34-66%) amount of necrotic tissue within the wound bed including Adherent Slough. The periwound skin appearance exhibited: Excoriation, Maceration. The periwound skin appearance did not exhibit: Callus, Crepitus, Induration, Rash, Scarring, Dry/Scaly, Atrophie Blanche, Cyanosis, Ecchymosis, Hemosiderin Staining, Mottled, Pallor, Rubor, Erythema. Periwound temperature was noted as No Abnormality. Pollett, Oluwafemi E. ([id number]) Wound #2 status is Open. Original cause of wound was Gradually Appeared. The wound is located on the Right Toe Second. The wound measures 1.4cm length x 1.8cm width x 0.1cm depth; 1.979cm^2 area and 0.198cm^3 volume. There is Fat Layer (Subcutaneous Tissue) Exposed exposed. There is no tunneling or undermining noted. There is a large amount of serosanguineous drainage noted. Foul odor after cleansing was noted. The wound margin is flat and intact. There is large (67-100%) pink granulation within the wound bed. There is no necrotic tissue within the wound bed. The periwound skin appearance exhibited: Excoriation, Maceration. The periwound skin appearance did not exhibit: Callus, Crepitus, Induration, Rash, Scarring, Dry/Scaly, Atrophie Blanche, Cyanosis, Ecchymosis, Hemosiderin Staining, Mottled, Pallor, Rubor, Erythema. Periwound temperature was noted as No Abnormality. Wound #5 status is Open. Original cause of wound was Gradually Appeared. The wound is located on the Left Toe Great. The wound measures  0.8cm length x 1.2cm width x 0.1cm depth; 0.754cm^2 area and 0.075cm^3 volume. There is Fat Layer (Subcutaneous Tissue) Exposed exposed. There is no tunneling or undermining noted. There is a large amount of serous drainage noted. Foul odor after cleansing was noted. The wound margin is flat and intact. There is large (67-100%) pink granulation within  the wound bed. There is a small (1-33%) amount of necrotic tissue within the wound bed including Adherent Slough. The periwound skin appearance exhibited: Excoriation. The periwound skin appearance did not exhibit: Callus, Crepitus, Induration, Rash, Scarring, Dry/Scaly, Maceration, Atrophie Blanche, Cyanosis, Ecchymosis, Hemosiderin Staining, Mottled, Pallor, Rubor, Erythema. Periwound temperature was noted as No Abnormality. Wound #6 status is Open. Original cause of wound was Gradually Appeared. The wound is located on the Left Toe Second. The wound measures 1cm length x 1.4cm width x 0.1cm depth; 1.1cm^2 area and 0.11cm^3 volume. There is Fat Layer (Subcutaneous Tissue) Exposed exposed. There is no tunneling or undermining noted. There is a large amount of serous drainage noted. Foul odor after cleansing was noted. The wound margin is flat and intact. There is medium (34-66%) pink granulation within the wound bed. There is a medium (34-66%) amount of necrotic tissue within the wound bed including Adherent Slough. The periwound skin appearance exhibited: Excoriation, Maceration. The periwound skin appearance did not exhibit: Callus, Crepitus, Induration, Rash, Scarring, Dry/Scaly, Atrophie Blanche, Cyanosis, Ecchymosis, Hemosiderin Staining, Mottled, Pallor, Rubor, Erythema. Periwound temperature was noted as No Abnormality. Wound #8 status is Open. Original cause of wound was Gradually Appeared. The wound is located on the Left Toe Third. The wound measures 0.6cm length x 1.7cm width x 0.1cm depth; 0.801cm^2 area and 0.08cm^3 volume. There is  no tunneling or undermining noted. There is a large amount of serosanguineous drainage noted. The wound margin is distinct with the outline attached to the wound base. There is large (67-100%) red granulation within the wound bed. There is no necrotic tissue within the wound bed. The periwound skin appearance exhibited: Maceration. Periwound temperature was noted as No Abnormality. The periwound has tenderness on palpation. Wound #9 status is Open. Original cause of wound was Gradually Appeared. The wound is located on the Right Toe Third. The wound measures 0.5cm length x 1cm width x 0.1cm depth; 0.393cm^2 area and 0.039cm^3 volume. There is no tunneling or undermining noted. There is a large amount of serosanguineous drainage noted. The wound margin is flat and intact. There is large (67-100%) red granulation within the wound bed. There is no necrotic tissue within the wound bed. The periwound skin appearance exhibited: Maceration. Periwound temperature was noted as No Abnormality. The periwound has tenderness on palpation. 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 Wound #2 Right Toe Second: Cleanse wound with mild soap and water May Shower, gently pat wound dry prior to applying new dressing. Wound #5 Left Toe  Great: Cleanse wound with mild soap and water May Shower, gently pat wound dry prior to applying new dressing. Wound #6 Left Toe Second: Cleanse wound with mild soap and water May Shower, gently pat wound dry prior to applying new dressing. Anesthetic: Wound #1 Right Toe Great: Topical Lidocaine 4% cream applied to wound bed prior to debridement Wound #2 Right Toe Second: Topical Lidocaine 4% cream  applied to wound bed prior to debridement Wound #5 Left Toe Great: Topical Lidocaine 4% cream applied to wound bed prior to debridement Wound #6 Left Toe Second: Topical Lidocaine 4% cream applied to wound bed prior to debridement Skin Barriers/Peri-Wound Care: Wound #1 Right Toe Great: Antifungal cream - on and between toes then AqAg over areas Wound #2 Right Toe Second: Antifungal cream - on and between toes then AqAg over areas Wound #5 Left Toe Great: Antifungal cream - on and between toes then AqAg over areas Wound #6 Left Toe Second: Jeziorski, Travanti E. ([id number]) Antifungal cream - on and between toes then AqAg over areas Primary Wound Dressing: Wound #1 Right Toe Great: Aquacel Ag Wound #2 Right Toe Second: Aquacel Ag Wound #5 Left Toe Great: Aquacel Ag Wound #6 Left Toe Second: Aquacel Ag Secondary Dressing: Wound #1 Right Toe Great: ABD pad Gauze and Kerlix/Conform Wound #2 Right Toe Second: ABD pad Gauze and Kerlix/Conform Wound #5 Left Toe Great: ABD pad Gauze and Kerlix/Conform Wound #6 Left Toe Second: ABD pad Gauze and Kerlix/Conform Dressing Change Frequency: Wound #1 Right Toe Great: Change dressing every day. Wound #2 Right Toe Second: Change dressing every day. Wound #5 Left Toe Great: Change dressing every day. Wound #6 Left Toe Second: Change dressing every day. Follow-up Appointments: Wound #1 Right Toe Great: Return Appointment in 1 week. Wound #2 Right Toe Second: Return Appointment in 1 week. Wound #5 Left Toe Great: Return Appointment in  1 week. Wound #6 Left Toe Second: Return Appointment in 1 week. Edema Control: Wound #1 Right Toe Great: Patient to wear own compression stockings Elevate legs to the level of the heart and pump ankles as often as possible Wound #2 Right Toe Second: Patient to wear own compression stockings Elevate legs to the level of the heart and pump ankles as often as possible Wound #5 Left Toe Great: Patient to wear own compression stockings Elevate legs to the level of the heart and pump ankles as often as possible Muto, [name] E. ([id number]) Wound #6 Left Toe Second: Patient to wear own compression stockings Elevate legs to the level of the heart and pump ankles as often as possible Additional Orders / Instructions: Wound #1 Right Toe Great: Stop Smoking - Please continue to stay away from alcohol Other: - Please add vitamin A, vitamin C, and Zinc supplements to your diet Wound #2 Right Toe Second: Stop Smoking - Please continue to stay away from alcohol Other: - Please add vitamin A, vitamin C, and Zinc supplements to your diet Wound #5 Left Toe Great: Stop Smoking - Please continue to stay away from alcohol Other: - Please add vitamin A, vitamin C, and Zinc supplements to your diet Wound #6 Left Toe Second: Stop Smoking - Please continue to stay away from alcohol Other: - Please add vitamin A, vitamin C, and Zinc supplements to your diet the patient has been noncompliant with his smoking and also possibly may have started drinking again. I have asked him to be very compliant with recommendations which have been: 1. Silver alginate to be applied daily after washing with soap and water and application of antifungal ointment. 2. Lotrisone ointment to be applied to both feet especially between the toes, daily. 3. Completely give up smoking and I have again reiterated this very strongly to him 4. regular visits to the wound center Electronic Signature(s) Signed: 12/31/2016 4:01:09 PM By:  Christin Fudge MD, FACS Entered By: Christin Fudge on 12/31/2016 08:46:02 Pinder, Emre E. (  [id number]) -------------------------------------------------------------------------------- SuperBill Details Patient Name: Hogrefe, Saquan E. Date of Service: 12/31/2016 Medical Record Number: [id number] Patient Account Number: [id number] Date of Birth/Sex: Nov 10, 1950 (66 y.o. 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 F17.218 Nicotine dependence, cigarettes, 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 abuse with unspecified alcohol-induced disorder Facility Procedures CPT4 Code: [id number] Description: 74944 - WOUND CARE VISIT-LEV 5 EST PT Modifier: Quantity: 1 Physician Procedures CPT4: Description Modifier Quantity Code [id number] - WC PHYS LEVEL 3 - EST PT 1 ICD-10 Description Diagnosis E11.621 Type 2 diabetes mellitus with foot ulcer I87.313 Chronic venous hypertension (idiopathic) with ulcer of bilateral lower extremity  I89.0 Lymphedema, not elsewhere classified F17.218 Nicotine dependence, cigarettes, with other nicotine-induced disorders Electronic Signature(s) Signed: 12/31/2016 4:01:09 PM By: Christin Fudge MD, FACS Signed: 12/31/2016 4:26:24 PM By: Alric Quan Entered By: Alric Quan on 12/31/2016 [place]

## 2017-01-04 LAB — CHROMOSOME ANALYSIS, BONE MARROW

## 2017-01-04 LAB — TISSUE HYBRIDIZATION (BONE MARROW)-NCBH

## 2017-01-07 ENCOUNTER — Encounter: Payer: Medicare HMO | Attending: Surgery | Admitting: Surgery

## 2017-01-07 DIAGNOSIS — F1019 Alcohol abuse with unspecified alcohol-induced disorder: Secondary | ICD-10-CM | POA: Diagnosis not present

## 2017-01-07 DIAGNOSIS — L97522 Non-pressure chronic ulcer of other part of left foot with fat layer exposed: Secondary | ICD-10-CM | POA: Diagnosis not present

## 2017-01-07 DIAGNOSIS — I87313 Chronic venous hypertension (idiopathic) with ulcer of bilateral lower extremity: Secondary | ICD-10-CM | POA: Insufficient documentation

## 2017-01-07 DIAGNOSIS — E11621 Type 2 diabetes mellitus with foot ulcer: Secondary | ICD-10-CM | POA: Insufficient documentation

## 2017-01-07 DIAGNOSIS — I890 Lymphedema, not elsewhere classified: Secondary | ICD-10-CM | POA: Diagnosis not present

## 2017-01-07 DIAGNOSIS — I509 Heart failure, unspecified: Secondary | ICD-10-CM | POA: Insufficient documentation

## 2017-01-07 DIAGNOSIS — L97512 Non-pressure chronic ulcer of other part of right foot with fat layer exposed: Secondary | ICD-10-CM | POA: Diagnosis not present

## 2017-01-07 DIAGNOSIS — F17218 Nicotine dependence, cigarettes, with other nicotine-induced disorders: Secondary | ICD-10-CM | POA: Diagnosis not present

## 2017-01-07 DIAGNOSIS — I110 Hypertensive heart disease with heart failure: Secondary | ICD-10-CM | POA: Insufficient documentation

## 2017-01-08 NOTE — Progress Notes (Signed)
RUSHTON, EARLY ([id number]) Visit Report for 01/07/2017 Chief Complaint Document Details Patient Name: [name], [name] 01/07/2017 9:15 Date of Service: AM Medical Record [id number] Number: Patient Account Number: [id number] Date of Birth/Sex: Jun 06, 1950 (66 y.o. Male) Treating RN: Elyn Peers, Other Clinician: Primary Care Provider: Olivia Mackie Treating Zanasia Hickson, Cherie Ouch, Provider/Extender: Referring Provider: Harless Nakayama in Treatment: 15 Information Obtained from: Patient Chief Complaint Patients presents for treatment of an open diabetic ulcer to both feet Electronic Signature(s) Signed: 01/07/2017 9:53:32 AM By: Christin Fudge MD, FACS Entered By: Christin Fudge on 01/07/2017 09:53:32 Shafer, LISA BLAKEMAN ([id number]) -------------------------------------------------------------------------------- HPI Details Patient Name: [name], [name] 01/07/2017 9:15 Date of Service: AM Medical Record [id number] Number: Patient Account Number: [id number] Date of Birth/Sex: 1950-09-24 (66 y.o. Male) Treating RN: Elyn Peers, Other Clinician: Primary Care Provider: Olivia Mackie Treating Tatjana Turcott, Cherie Ouch, Provider/Extender: Referring Provider: Harless Nakayama in Treatment: 15 History of Present Illness Location: bilateral feet ulceration on the toes Quality: Patient reports experiencing a dull pain to affected area(s). Severity: Patient states wound are getting better Duration: Patient has had the wound for > 3 months prior to seeking treatment at the wound center Timing: Pain in wound is constant (hurts all the time) Context: The wound would happen gradually Modifying Factors: Other treatment(s) tried include:treatment for lymphedema and is seen by the podiatrist Dr. Caryl Comes Associated Signs and Symptoms: Patient reports having increase swelling. HPI Description: 66 year old patient here to see as for bilateral feet ulceration to on his left first  and second toe and 2 on his right first and second toe, which she's had for about 4 months. 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 Donella Stade. The patient is also undergoing workup by medical oncology for a intra-abdominal lymphadenopathy. 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 He is also status post appendectomy, inguinal lymph node biopsy, rectal biopsy and rectal examination under anesthesia. he currently smokes cigarettes about half packet a day. In March of this year he was seen by Dr. Hortencia Pilar, for evaluation of bilateral varicose veins and besides wearing compression stockings he had recommended laser ablation of the right and left great saphenous veins to eleviate the symptoms and complications of severe superficial venous reflux disease. He also recommended lymphedema pumps for better control of his lymphedema. The patient recently has had on 08/23/2016, right greater saphenous vein ablation with the laser energy Earlier lower extremity venous reflux examination done on 05/08/2016 showed no DVT or SVT both lower legs but incompetence of bilateral great saphenous veins was present. A lower arterial study was also done and there was no significant right lower and left lower extremity problems based on a normal toe brachial index bilaterally and the ABI was 1.21 the left and 1.23 on the right. His post ablation venous duplex examination showed successful ablation of the right GS vein with thrombus formation 2 below the right saphenofemoral junction. The deep system was patent without evidence of thrombosis and this was done on 08/30/2016. the patient also has a squamous cell cancer of the skin of the buttock and is recently  undergone radiation therapy for this prior to excisional surgery. Addendum: regarding his x-rays done today and x-ray of the left foot -- IMPRESSION: No objective Kopera, Hardeep E. ([id number]) evidence of osteomyelitis.  There are soft tissue changes which may reflect cellulitis. X-ray of the right foot -- IMPRESSION:Findings compatible with cellulitis of the toes. No objective evidence of osteomyelitis is observed. 10/01/16 on evaluation today patient's wounds appeared to be doing some better. I did review the x-rays as well which showed no evidence of osteomyelitis although there was evidence on x-ray of cellulitis. He fortunately is not having any discomfort although he continues to have some swelling. He does not remember being on any antibiotics recently. 10/15/16 on evaluation today patient's wounds overall appear to be doing better although he does have a new location noted on the left foot. Fortunately he is not having significant pain. It almost has the appearance that something is rubbing on the end of his toes but he wears the open toe shoes and according to what he is telling me never wears anything that would rub on his foot. There is no evidence of infection and specifically no evidence of a fungal infection 10/22/16 On evaluation today patient's wounds appeared to be doing better compared to last week in regard to his bilateral lower extremities. Fortunately I happy with how things are progressing although he still has ulcers I feel like that he is improving and appropriate manner. 11/12/16 on evaluation today patient appears to be doing well in regard to his bilateral feet and the respective wounds. We have been using surrounding her dressings along with an antifungal cream which seems to be doing very well. He has no bilateral dysfunction noticed that the rituals are weight loss at this point. He also has no nausea or vomiting a note purulent discharge. He did see Vein and vascular  today and he tells me that they told him he could have surgery for his venous stasis but they did not feel like it was worth it in his words. Fortunately patient's wounds do appear to be getting sneakily better. 11/26/2016 -- he says he is going to have some surgery during this week at [facility] for possibly a colon resection. 12/31/2016 -- the patient has been noncompliant with his smoking and I'm not sure whether he is also started drinking again. He continues to be very nonchalant about his care Electronic Signature(s) Signed: 01/07/2017 9:53:37 AM By: Christin Fudge MD, FACS Entered By: Christin Fudge on 01/07/2017 09:53:37 Yaw, RIDDICK NUON ([id number]) -------------------------------------------------------------------------------- Physical Exam Details Patient Name: [name], [name] 01/07/2017 9:15 Date of Service: AM Medical Record [id number] Number: Patient Account Number: [id number] Date of Birth/Sex: 1950/11/06 (66 y.o. Male) Treating RN: Elyn Peers, Other Clinician: Primary Care Provider: Olivia Mackie Treating Anyelin Mogle, Cherie Ouch, Provider/Extender: Referring Provider: Harless Nakayama in Treatment: 15 Constitutional . Pulse regular. Respirations normal and unlabored. Afebrile. . Eyes Nonicteric. Reactive to light. Ears, Nose, Mouth, and Throat Lips, teeth, and gums WNL.Marland Kitchen Moist mucosa without lesions. Neck supple and nontender. No palpable supraclavicular or cervical adenopathy. Normal sized without goiter. Respiratory WNL. No retractions.. Cardiovascular Pedal Pulses WNL. No clubbing, cyanosis or edema. Lymphatic No adneopathy. No adenopathy. No adenopathy. Musculoskeletal Adexa without tenderness or enlargement.. Digits and nails w/o clubbing, cyanosis, infection, petechiae, ischemia, or inflammatory conditions.. Integumentary (Hair, Skin) No suspicious lesions. No crepitus or fluctuance. No peri-wound warmth or erythema. No  masses.Marland Kitchen Psychiatric Judgement and insight Intact.. No evidence of depression, anxiety, or agitation.. Notes the wounds are overall looking much better today with no evidence of significant fungal infection and some of the areas have closed down nicely. No sharp debridement was required today. Electronic Signature(s) Signed: 01/07/2017 9:54:18  AM By: Christin Fudge MD, FACS Entered By: Christin Fudge on 01/07/2017 09:54:17 Borcherding, ARAMIS WEIL ([id number]) -------------------------------------------------------------------------------- Physician Orders Details Patient Name: [name], [name] 01/07/2017 9:15 Date of Service: AM Medical Record [id number] Number: Patient Account Number: [id number] Date of Birth/Sex: 07/29/1950 (66 y.o. Male) Treating RN: Elyn Peers, Other Clinician: Primary Care Provider: Olivia Mackie Treating Omar Gayden, Cherie Ouch, Provider/Extender: Referring Provider: Harless Nakayama in Treatment: 15 Verbal / Phone Orders: No Diagnosis Coding Wound Cleansing Wound #1 Right Toe Great o Cleanse wound with mild soap and water o May Shower, gently pat wound dry prior to applying new dressing. Wound #2 Right Toe Second o Cleanse wound with mild soap and water o May Shower, gently pat wound dry prior to applying new dressing. Wound #5 Left Toe Great o Cleanse wound with mild soap and water o May Shower, gently pat wound dry prior to applying new dressing. Wound #6 Left Toe Second o Cleanse wound with mild soap and water o May Shower, gently pat wound dry prior to applying new dressing. Anesthetic Wound #1 Right Toe Great o Topical Lidocaine 4% cream applied to wound bed prior to debridement Wound #2 Right Toe Second o Topical Lidocaine 4% cream applied to wound bed prior to debridement Wound #5 Left Toe Great o Topical Lidocaine 4% cream applied to wound bed prior to debridement Wound #6 Left Toe Second o Topical Lidocaine 4%  cream applied to wound bed prior to debridement Skin Barriers/Peri-Wound Care Wound #1 Right Toe Great o Antifungal cream - on and between toes then AqAg over areas Sturgess, Langdon E. ([id number]) Wound #2 Right Toe Second o Antifungal cream - on and between toes then AqAg over areas Wound #5 Left Toe Great o Antifungal cream - on and between toes then AqAg over areas Wound #6 Left Toe Second o Antifungal cream - on and between toes then AqAg over areas Primary Wound Dressing Wound #1 Right Toe Great o Aquacel Ag Wound #2 Right Toe Second o Aquacel Ag Wound #5 Left Toe Great o Aquacel Ag Wound #6 Left Toe Second o Aquacel Ag Secondary Dressing Wound #1 Right Toe Great o ABD pad o Gauze and Kerlix/Conform Wound #2 Right Toe Second o ABD pad o Gauze and Kerlix/Conform Wound #5 Left Toe Great o ABD pad o Gauze and Kerlix/Conform Wound #6 Left Toe Second o ABD pad o Gauze and Kerlix/Conform Dressing Change Frequency Wound #1 Right Toe Great o Change dressing every day. Wound #2 Right Toe Second o Change dressing every day. Wound #5 Left [address], Virat E. ([id number]) o Change dressing every day. Wound #6 Left Toe Second o Change dressing every day. Follow-up Appointments Wound #1 Right Toe Great o Return Appointment in 1 week. Wound #2 Right Toe Second o Return Appointment in 1 week. Wound #5 Left Toe Great o Return Appointment in 1 week. Wound #6 Left Toe Second o Return Appointment in 1 week. Edema Control Wound #1 Right Toe Great o Patient to wear own compression stockings o Elevate legs to the level of the heart and pump ankles as often as possible Wound #2 Right Toe Second o Patient to wear own compression stockings o Elevate legs to the level of the heart and pump ankles as often as possible Wound #5 Left Toe Great o Patient to wear own compression stockings o Elevate legs to the level of the  heart and pump ankles as often as possible Wound #6 Left Toe Second o Patient to wear own compression  stockings o Elevate legs to the level of the heart and pump ankles as often as possible Additional Orders / Instructions Wound #1 Right Toe Great o Stop Smoking - Please continue to stay away from alcohol o Other: - Please add vitamin A, vitamin C, and Zinc supplements to your diet Wound #2 Right Toe Second o Stop Smoking - Please continue to stay away from alcohol o Other: - Please add vitamin A, vitamin C, and Zinc supplements to your diet Wound #5 Left Toe Great o Stop Smoking - Please continue to stay away from alcohol o Other: - Please add vitamin A, vitamin C, and Zinc supplements to your diet Miranda, Caison E. ([id number]) Wound #6 Left Toe Second o Stop Smoking - Please continue to stay away from alcohol o Other: - Please add vitamin A, vitamin C, and Zinc supplements to your diet Electronic Signature(s) Signed: 01/07/2017 4:37:28 PM By: Christin Fudge MD, FACS Signed: 01/07/2017 5:21:31 PM By: Montey Hora Entered By: Montey Hora on 01/07/2017 09:46:45 Mol, [name] ([id number]) -------------------------------------------------------------------------------- Problem List Details Patient Name: [name], [name] 01/07/2017 9:15 Date of Service: AM Medical Record [id number] Number: Patient Account Number: [id number] Date of Birth/Sex: [birth year] (66 y.o. Male) Treating RN: Elyn Peers, Other Clinician: Primary Care Provider: Olivia Mackie Treating Misty Foutz, Cherie Ouch, Provider/Extender: Referring Provider: Harless Nakayama in Treatment: 15 Active Problems ICD-10 Encounter Code Description Active Date Diagnosis E11.621 Type 2 diabetes mellitus with foot ulcer 09/21/2016 Yes I87.313 Chronic venous hypertension (idiopathic) with ulcer of 09/21/2016 Yes bilateral lower extremity I89.0 Lymphedema, not elsewhere classified 09/21/2016  Yes L97.522 Non-pressure chronic ulcer of other part of left foot with fat 09/21/2016 Yes layer exposed L97.512 Non-pressure chronic ulcer of other part of right foot with 09/21/2016 Yes fat layer exposed F17.218 Nicotine dependence, cigarettes, 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 01/07/2017 9:15 Date of Service: AM Medical Record [id number] Number: Patient Account Number: [id number] Date of Birth/Sex: February 18, 1951 (66 y.o. Male) Treating RN: Elyn Peers, Other Clinician: Primary Care Provider: Olivia Mackie Treating Corby Villasenor, Cherie Ouch, Provider/Extender: Referring Provider: Harless Nakayama in Treatment: 15 Subjective Chief Complaint Information obtained from Patient Patients presents for treatment of an open diabetic ulcer to both feet History of Present Illness (HPI) The following HPI elements were documented for the patient's wound: Location: bilateral feet ulceration on the toes Quality: Patient reports experiencing a dull pain to affected area(s). Severity: Patient states wound are getting better Duration: Patient has had the wound for > 3 months prior to seeking treatment at the wound center Timing: Pain in wound is constant (hurts all the time) Context: The wound would happen gradually Modifying Factors: Other treatment(s) tried include:treatment for lymphedema and is seen by the podiatrist Dr. Caryl Comes Associated Signs and Symptoms: Patient reports having increase swelling. 66 year old patient here to see as for  bilateral feet ulceration to on his left first and second toe and 2 on his right first and second toe, which she's had for about 4 months. 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 Donella Stade. The patient is also undergoing workup by medical oncology  for a intra- abdominal lymphadenopathy. 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 He is also status post appendectomy, inguinal lymph node biopsy, rectal biopsy and rectal examination under anesthesia. he currently smokes cigarettes about half packet a day. In March of this year he was seen by Dr. Hortencia Pilar, for evaluation of bilateral varicose veins and besides wearing compression stockings he had recommended laser ablation of the right and left great saphenous veins to eleviate the symptoms and complications of severe superficial venous reflux disease. He also recommended lymphedema pumps for better control of his lymphedema. The patient recently has had on 08/23/2016, right greater saphenous vein ablation with the laser energy Earlier lower extremity venous reflux examination done on 05/08/2016 showed no DVT or SVT both lower legs but incompetence of bilateral great saphenous veins was present. A lower arterial study was also done and there was no significant right lower and left lower extremity problems based on a normal toe brachial index bilaterally and the ABI was 1.21 the left and 1.23 on the Fanelli, Trayquan E. ([id number]) right. His post ablation venous duplex examination showed successful ablation of the right GS vein with thrombus formation 2 below the right saphenofemoral junction. The deep system was patent without evidence of thrombosis and this was done on 08/30/2016. the patient also  has a squamous cell cancer of the skin of the buttock and is recently undergone radiation therapy for this prior to excisional surgery. Addendum: regarding his x-rays done today and x-ray of the left foot -- IMPRESSION: No objective evidence of osteomyelitis. There are soft tissue changes which may reflect cellulitis. X-ray of the right foot -- IMPRESSION:Findings compatible with cellulitis of the toes. No objective evidence of osteomyelitis is observed. 10/01/16 on evaluation today patient's wounds appeared to be doing some better. I did review the x-rays as well which showed no evidence of osteomyelitis although there was evidence on x-ray of cellulitis. He fortunately is not having any discomfort although he continues to have some swelling. He does not remember being on any antibiotics recently. 10/15/16 on evaluation today patient's wounds overall appear to be doing better although he does have a new location noted on the left foot. Fortunately he is not having significant pain. It almost has the appearance that something is rubbing on the end of his toes but he wears the open toe shoes and according to what he is telling me never wears anything that would rub on his foot. There is no evidence of infection and specifically no evidence of a fungal infection 10/22/16 On evaluation today patient's wounds appeared to be doing better compared to last week in regard to his bilateral lower extremities. Fortunately I happy with how things are progressing although he still has ulcers I feel like that he is improving and appropriate manner. 11/12/16 on evaluation today patient appears to be doing well in regard to his bilateral feet and the respective wounds. We have been using surrounding her dressings along with an antifungal cream which seems to be doing very well. He has no bilateral dysfunction noticed that the rituals are weight loss at this point. He also has no nausea or vomiting a note purulent  discharge. He did see Vein and vascular today and he tells me that they told him he could have surgery for his venous stasis but they did not feel like it was worth it in his words. Fortunately patient's  wounds do appear to be getting sneakily better. 11/26/2016 -- he says he is going to have some surgery during this week at [facility] for possibly a colon resection. 12/31/2016 -- the patient has been noncompliant with his smoking and I'm not sure whether he is also started drinking again. He continues to be very nonchalant about his care Objective [name], [name] ([id number]) Constitutional Pulse regular. Respirations normal and unlabored. Afebrile. Vitals Time Taken: 9:27 AM, Height: 69 in, Weight: 168 lbs, BMI: 24.8, Temperature: 98.4 F, Pulse: 62 bpm, Respiratory Rate: 16 breaths/min, Blood Pressure: 151/81 mmHg. Eyes Nonicteric. Reactive to light. Ears, Nose, Mouth, and Throat Lips, teeth, and gums WNL.Marland Kitchen Moist mucosa without lesions. Neck supple and nontender. No palpable supraclavicular or cervical adenopathy. Normal sized without goiter. Respiratory WNL. No retractions.. Cardiovascular Pedal Pulses WNL. No clubbing, cyanosis or edema. Lymphatic No adneopathy. No adenopathy. No adenopathy. Musculoskeletal Adexa without tenderness or enlargement.. Digits and nails w/o clubbing, cyanosis, infection, petechiae, ischemia, or inflammatory conditions.Marland Kitchen Psychiatric Judgement and insight Intact.. No evidence of depression, anxiety, or agitation.. General Notes: the wounds are overall looking much better today with no evidence of significant fungal infection and some of the areas have closed down nicely. No sharp debridement was required today. Integumentary (Hair, Skin) No suspicious lesions. No crepitus or fluctuance. No peri-wound warmth or erythema. No masses.. Wound #1 status is Open. Original cause of wound was Gradually Appeared. The wound is located on the Right Toe Great.  The wound measures 0.9cm length x 5.2cm width x 0.2cm depth; 3.676cm^2 area and 0.735cm^3 volume. There is Fat Layer (Subcutaneous Tissue) Exposed exposed. There is no tunneling or undermining noted. There is a large amount of serosanguineous drainage noted. Foul odor after cleansing was noted. The wound margin is flat and intact. There is medium (34-66%) pink granulation within the wound bed. There is a medium (34-66%) amount of necrotic tissue within the wound bed including Adherent Slough. The periwound skin appearance exhibited: Excoriation, Maceration. The periwound skin appearance did not exhibit: Callus, Crepitus, Induration, Rash, Scarring, Dry/Scaly, Atrophie Blanche, Cyanosis, Ecchymosis, Hemosiderin Staining, Mottled, Pallor, Rubor, Erythema. Periwound temperature was noted as No Abnormality. [name], [name] ([id number]) Wound #2 status is Open. Original cause of wound was Gradually Appeared. The wound is located on the Right Toe Second. The wound measures 1cm length x 1.5cm width x 0.1cm depth; 1.178cm^2 area and 0.118cm^3 volume. There is Fat Layer (Subcutaneous Tissue) Exposed exposed. There is no tunneling or undermining noted. There is a large amount of serosanguineous drainage noted. Foul odor after cleansing was noted. The wound margin is flat and intact. There is large (67-100%) pink granulation within the wound bed. There is no necrotic tissue within the wound bed. The periwound skin appearance exhibited: Excoriation, Maceration. The periwound skin appearance did not exhibit: Callus, Crepitus, Induration, Rash, Scarring, Dry/Scaly, Atrophie Blanche, Cyanosis, Ecchymosis, Hemosiderin Staining, Mottled, Pallor, Rubor, Erythema. Periwound temperature was noted as No Abnormality. Wound #5 status is Open. Original cause of wound was Gradually Appeared. The wound is located on the Left Toe Great. The wound measures 0.6cm length x 0.7cm width x 0.1cm depth; 0.33cm^2 area  and 0.033cm^3 volume. There is Fat Layer (Subcutaneous Tissue) Exposed exposed. There is no tunneling or undermining noted. There is a large amount of serous drainage noted. Foul odor after cleansing was noted. The wound margin is flat and intact. There is large (67-100%) pink granulation within the wound bed. There is a small (1-33%) amount of necrotic tissue within the  wound bed including Adherent Slough. The periwound skin appearance exhibited: Excoriation. The periwound skin appearance did not exhibit: Callus, Crepitus, Induration, Rash, Scarring, Dry/Scaly, Maceration, Atrophie Blanche, Cyanosis, Ecchymosis, Hemosiderin Staining, Mottled, Pallor, Rubor, Erythema. Periwound temperature was noted as No Abnormality. Wound #6 status is Open. Original cause of wound was Gradually Appeared. The wound is located on the Left Toe Second. The wound measures 0.5cm length x 0.7cm width x 0.1cm depth; 0.275cm^2 area and 0.027cm^3 volume. There is Fat Layer (Subcutaneous Tissue) Exposed exposed. There is no tunneling or undermining noted. There is a large amount of serous drainage noted. Foul odor after cleansing was noted. The wound margin is flat and intact. There is medium (34-66%) pink granulation within the wound bed. There is a medium (34-66%) amount of necrotic tissue within the wound bed including Adherent Slough. The periwound skin appearance exhibited: Excoriation, Maceration. The periwound skin appearance did not exhibit: Callus, Crepitus, Induration, Rash, Scarring, Dry/Scaly, Atrophie Blanche, Cyanosis, Ecchymosis, Hemosiderin Staining, Mottled, Pallor, Rubor, Erythema. Periwound temperature was noted as No Abnormality. Wound #8 status is Open. Original cause of wound was Gradually Appeared. The wound is located on the Left Toe Third. The wound measures 0.5cm length x 0.4cm width x 0.1cm depth; 0.157cm^2 area and 0.016cm^3 volume. There is no tunneling or undermining noted. There is a large  amount of serosanguineous drainage noted. The wound margin is distinct with the outline attached to the wound base. There is large (67-100%) red granulation within the wound bed. There is no necrotic tissue within the wound bed. The periwound skin appearance exhibited: Maceration. Periwound temperature was noted as No Abnormality. The periwound has tenderness on palpation. Wound #9 status is Open. Original cause of wound was Gradually Appeared. The wound is located on the Right Toe Third. The wound measures 0.6cm length x 0.4cm width x 0.1cm depth; 0.188cm^2 area and 0.019cm^3 volume. There is no tunneling or undermining noted. There is a large amount of serosanguineous drainage noted. The wound margin is flat and intact. There is large (67-100%) red granulation within the wound bed. There is no necrotic tissue within the wound bed. The periwound skin appearance exhibited: Maceration. Periwound temperature was noted as No Abnormality. The periwound has tenderness on palpation. 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 Wound #2 Right Toe Second: Cleanse wound with mild soap and water May Shower, gently pat wound dry prior to applying new dressing. Wound #5 Left Toe Great: Cleanse wound with mild soap and water May  Shower, gently pat wound dry prior to applying new dressing. Wound #6 Left Toe Second: Cleanse wound with mild soap and water May Shower, gently pat wound dry prior to applying new dressing. Anesthetic: Wound #1 Right Toe Great: Topical Lidocaine 4% cream applied to wound bed prior to debridement Wound #2 Right Toe Second: Topical Lidocaine 4% cream applied to wound bed prior to debridement Wound #5 Left Toe Great: Topical Lidocaine  4% cream applied to wound bed prior to debridement Wound #6 Left Toe Second: Topical Lidocaine 4% cream applied to wound bed prior to debridement Skin Barriers/Peri-Wound Care: Wound #1 Right Toe Great: Antifungal cream - on and between toes then AqAg over areas Wound #2 Right Toe Second: Antifungal cream - on and between toes then AqAg over areas Wound #5 Left Toe Great: Antifungal cream - on and between toes then AqAg over areas Wound #6 Left Toe Second: [name], [name] ([id number]) Antifungal cream - on and between toes then AqAg over areas Primary Wound Dressing: Wound #1 Right Toe Great: Aquacel Ag Wound #2 Right Toe Second: Aquacel Ag Wound #5 Left Toe Great: Aquacel Ag Wound #6 Left Toe Second: Aquacel Ag Secondary Dressing: Wound #1 Right Toe Great: ABD pad Gauze and Kerlix/Conform Wound #2 Right Toe Second: ABD pad Gauze and Kerlix/Conform Wound #5 Left Toe Great: ABD pad Gauze and Kerlix/Conform Wound #6 Left Toe Second: ABD pad Gauze and Kerlix/Conform Dressing Change Frequency: Wound #1 Right Toe Great: Change dressing every day. Wound #2 Right Toe Second: Change dressing every day. Wound #5 Left Toe Great: Change dressing every day. Wound #6 Left Toe Second: Change dressing every day. Follow-up Appointments: Wound #1 Right Toe Great: Return Appointment in 1 week. Wound #2 Right Toe Second: Return Appointment in 1 week. Wound #5 Left Toe Great: Return Appointment in 1 week. Wound #6 Left Toe Second: Return  Appointment in 1 week. Edema Control: Wound #1 Right Toe Great: Patient to wear own compression stockings Elevate legs to the level of the heart and pump ankles as often as possible Wound #2 Right Toe Second: Patient to wear own compression stockings Elevate legs to the level of the heart and pump ankles as often as possible Wound #5 Left Toe Great: Patient to wear own compression stockings Elevate legs to the level of the heart and pump ankles as often as possible [name], [name] ([id number]) Wound #6 Left Toe Second: Patient to wear own compression stockings Elevate legs to the level of the heart and pump ankles as often as possible Additional Orders / Instructions: Wound #1 Right Toe Great: Stop Smoking - Please continue to stay away from alcohol Other: - Please add vitamin A, vitamin C, and Zinc supplements to your diet Wound #2 Right Toe Second: Stop Smoking - Please continue to stay away from alcohol Other: - Please add vitamin A, vitamin C, and Zinc supplements to your diet Wound #5 Left Toe Great: Stop Smoking - Please continue to stay away from alcohol Other: - Please add vitamin A, vitamin C, and Zinc supplements to your diet Wound #6 Left Toe Second: Stop Smoking - Please continue to stay away from alcohol Other: - Please add vitamin A, vitamin C, and Zinc supplements to your diet he is doing better and my recommendations which have been: 1. Silver alginate to be applied daily after washing with soap and water and application of antifungal ointment. 2. Lotrisone ointment to be applied to both feet especially between the toes, daily. 3. Completely give up smoking and I have again reiterated this very strongly to him 4. regular visits to the wound center Electronic Signature(s) Signed: 01/07/2017 9:55:38 AM By: Christin Fudge MD, FACS Entered By: Christin Fudge on 01/07/2017 09:55:38 [name], [name]  ([id number]) -------------------------------------------------------------------------------- SuperBill Details Patient Name: [name], [name] Date of Service: 01/07/2017 Medical Record Number: [id number] Patient Account Number: [id number] Date of Birth/Sex: [birth year] (66 y.o. 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 F17.218 Nicotine dependence, cigarettes, 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 abuse with unspecified alcohol-induced disorder Facility Procedures CPT4 Code: [id number] Description: 57903 - WOUND CARE VISIT-LEV 5 EST PT Modifier: Quantity: 1 Physician Procedures CPT4: Description Modifier Quantity Code [id number] - WC PHYS LEVEL 3 - EST PT 1 ICD-10 Description Diagnosis E11.621 Type 2 diabetes mellitus with foot ulcer L97.522 Non-pressure chronic ulcer of other part of left foot with fat layer exposed I87.313  Chronic venous hypertension (idiopathic) with ulcer of bilateral lower extremity L97.512 Non-pressure chronic ulcer of other part of right foot with fat layer exposed Electronic Signature(s) Signed: 01/07/2017 4:37:28 PM By: Christin Fudge MD, FACS Signed: 01/07/2017 5:21:31 PM By: Montey Hora Previous Signature: 01/07/2017 9:57:51 AM Version By: Christin Fudge MD, FACS Previous Signature: 01/07/2017 9:56:00 AM Version By: Christin Fudge MD, FACS Entered By: Montey Hora on 01/07/2017 10:07:43

## 2017-01-08 NOTE — Progress Notes (Signed)
[name], [name] ([id number]) Visit Report for 01/07/2017 Arrival Information Details Patient Name: [name], [name] 01/07/2017 9:15 Date of Service: AM Medical Record [id number] Number: Patient Account Number: [id number] Date of Birth/Sex: 10/07/1950 (66 y.o. Male) Treating RN: [name], Other Clinician: Primary Care [name]: [name] Treating Britto, [name], [name]/Extender: Referring [name]: [name] in Treatment: 15 Visit Information History Since Last Visit Added or deleted any medications: No Patient Arrived: Ambulatory Any new allergies or adverse reactions: No Arrival Time: 09:27 Had a fall or experienced change in No Accompanied By: brother activities of daily living that may affect Transfer Assistance: None risk of falls: Patient Identification Verified: Yes Signs or symptoms of abuse/neglect since last No Secondary Verification Process Yes visito Completed: Hospitalized since last visit: No Patient Requires Transmission-Based No Has Dressing in Place as Prescribed: Yes Precautions: Pain Present Now: No Patient Has Alerts: Yes Patient Alerts: DMII Electronic Signature(s) Signed: 01/07/2017 5:21:31 PM By: [name] Entered By: [name] on 01/07/2017 09:27:21 [name], [name] ([id number]) -------------------------------------------------------------------------------- Clinic Level of Care Assessment Details Patient Name: [name] 01/07/2017 9:15 Date of Service: AM Medical Record [id number] Number: Patient Account Number: [id number] Date of Birth/Sex: Dec 02, 1950 (66 y.o. Male) Treating RN: [name], Other Clinician: Primary Care [name]: [name] Treating Britto, [name], [name]/Extender: Referring [name]: [name] in Treatment: 15 Clinic Level of Care Assessment Items TOOL 4 Quantity Score []  - Use when only an EandM is performed on FOLLOW-UP visit  0 ASSESSMENTS - Nursing Assessment / Reassessment X - Reassessment of Co-morbidities (includes updates in patient status) 1 10 X - Reassessment of Adherence to Treatment Plan 1 5 ASSESSMENTS - Wound and Skin Assessment / Reassessment []  - Simple Wound Assessment / Reassessment - one wound 0 X - Complex Wound Assessment / Reassessment - multiple wounds 6 5 []  - Dermatologic / Skin Assessment (not related to wound area) 0 ASSESSMENTS - Focused Assessment []  - Circumferential Edema Measurements - multi extremities 0 []  - Nutritional Assessment / Counseling / Intervention 0 X - Lower Extremity Assessment (monofilament, tuning fork, pulses) 1 5 []  - Peripheral Arterial Disease Assessment (using hand held doppler) 0 ASSESSMENTS - Ostomy and/or Continence Assessment and Care []  - Incontinence Assessment and Management 0 []  - Ostomy Care Assessment and Management (repouching, etc.) 0 PROCESS - Coordination of Care X - Simple Patient / Family Education for ongoing care 1 15 []  - Complex (extensive) Patient / Family Education for ongoing care 0 []  - Staff obtains Consents, Records, Test Results / Process Orders 0 [name], [name] ([id number]) []  - Staff telephones HHA, Nursing Homes / Clarify orders / etc 0 []  - Routine Transfer to another Facility (non-emergent condition) 0 []  - Routine Hospital Admission (non-emergent condition) 0 []  - New Admissions / Biomedical engineer / Ordering NPWT, Apligraf, etc. 0 []  - Emergency Hospital Admission (emergent condition) 0 X - Simple Discharge Coordination 1 10 []  - Complex (extensive) Discharge Coordination 0 PROCESS - Special Needs []  - Pediatric / Minor Patient Management 0 []  - Isolation Patient Management 0 []  - Hearing / Language / Visual special needs 0 []  - Assessment of Community assistance (transportation, D/C planning, etc.) 0 []  - Additional assistance / Altered mentation 0 []  - Support Surface(s) Assessment (bed, cushion, seat, etc.)  0 INTERVENTIONS - Wound Cleansing / Measurement []  - Simple Wound Cleansing - one wound 0 X - Complex Wound Cleansing - multiple wounds 6 5 X - Wound Imaging (photographs - any number of wounds) 1  5 []  - Wound Tracing (instead of photographs) 0 []  - Simple Wound Measurement - one wound 0 X - Complex Wound Measurement - multiple wounds 6 5 INTERVENTIONS - Wound Dressings X - Small Wound Dressing one or multiple wounds 6 10 []  - Medium Wound Dressing one or multiple wounds 0 []  - Large Wound Dressing one or multiple wounds 0 X - Application of Medications - topical 1 5 []  - Application of Medications - injection 0 [name], [name] ([id number]) INTERVENTIONS - Miscellaneous []  - External ear exam 0 []  - Specimen Collection (cultures, biopsies, blood, body fluids, etc.) 0 []  - Specimen(s) / Culture(s) sent or taken to Lab for analysis 0 []  - Patient Transfer (multiple staff / [name] Lift / Similar devices) 0 []  - Simple Staple / Suture removal (25 or less) 0 []  - Complex Staple / Suture removal (26 or more) 0 []  - Hypo / Hyperglycemic Management (close monitor of Blood Glucose) 0 []  - Ankle / Brachial Index (ABI) - do not check if billed separately 0 X - Vital Signs 1 5 Has the patient been seen at the hospital within the last three years: Yes Total Score: 210 Level Of Care: New/Established - Level 5 Electronic Signature(s) Signed: 01/07/2017 5:21:31 PM By: [name] Entered By: [name] on 01/07/2017 10:07:33 [name], [name] ([id number]) -------------------------------------------------------------------------------- Encounter Discharge Information Details Patient Name: [name], [name] 01/07/2017 9:15 Date of Service: AM Medical Record [id number] Number: Patient Account Number: [id number] Date of Birth/Sex: 1951-02-18 (66 y.o. Male) Treating RN: [name], Other Clinician: Primary Care [name]: [name] Treating Britto, [name],  [name]/Extender: Referring Dawon Troop: [name] in Treatment: 15 Encounter Discharge Information Items Discharge Pain Level: 0 Discharge Condition: Stable Ambulatory Status: Ambulatory Discharge Destination: Home Transportation: Private Auto Accompanied By: brother Schedule Follow-up Appointment: Yes Medication Reconciliation completed and provided to Patient/Care No Kebra Lowrimore: Provided on Clinical Summary of Care: 01/07/2017 Form Type Recipient Paper Patient ED Electronic Signature(s) Signed: 01/07/2017 5:21:31 PM By: [name] Entered By: [name] on 01/07/2017 10:08:48 Nevares, [name] ([id number]) -------------------------------------------------------------------------------- Lower Extremity Assessment Details Patient Name: [name], [name] 01/07/2017 9:15 Date of Service: AM Medical Record [id number] Number: Patient Account Number: [id number] Date of Birth/Sex: Jan 28, 1951 (66 y.o. Male) Treating RN: [name], Other Clinician: Primary Care Lailah Marcelli: [name] Treating Britto, [name], Tylan Kinn/Extender: Referring Marranda Arakelian: [name] in Treatment: 15 Vascular Assessment Pulses: Dorsalis Pedis Palpable: [Left:Yes] [Right:Yes] Posterior Tibial Extremity colors, hair growth, and conditions: Extremity Color: [Left:Hyperpigmented] [Right:Hyperpigmented] Hair Growth on Extremity: [Left:No] [Right:No] Temperature of Extremity: [Left:Warm] [Right:Warm] Capillary Refill: [Left:< 3 seconds] [Right:< 3 seconds] Electronic Signature(s) Signed: 01/07/2017 5:21:31 PM By: [name] Entered By: [name] on 01/07/2017 09:37:58 Vanscyoc, [name] -------------------------------------------------------------------------------- Multi Wound Chart Details Patient Name: [name] 01/07/2017 9:15 Date of Service: AM Medical Record [id number] Number: Patient Account Number: [id number] Date of Birth/Sex: 07-Apr-1951  (66 y.o. Male) Treating RN: [name], Other Clinician: Primary Care Digna Countess: [name] Treating Britto, [name], Raeshawn Tafolla/Extender: Referring Nyzir Dubois: [name] in Treatment: 15 Vital Signs Height(in): 69 Pulse(bpm): 62 Weight(lbs): 168 Blood Pressure 151/81 (mmHg): Body Mass Index(BMI): 25 Temperature(F): 98.4 Respiratory Rate 16 (breaths/min): Photos: [1:No Photos] [2:No Photos] [5:No Photos] Wound Location: [1:Right Toe Great] [2:Right Toe Second] [5:Left Toe Great] Wounding Event: [1:Gradually Appeared] [2:Gradually Appeared] [5:Gradually Appeared] Primary Etiology: [1:Diabetic Wound/Ulcer of the Lower Extremity] [2:Diabetic Wound/Ulcer of the Lower Extremity] [5:Diabetic Wound/Ulcer of the Lower Extremity] Comorbid History: [1:Anemia, Lymphedema, Congestive Heart Failure, Hypertension, Peripheral Venous Disease,  Type II Diabetes, Neuropathy] [2:Anemia, Lymphedema, Congestive Heart Failure, Hypertension, Peripheral Venous Disease, Type II Diabetes,  Neuropathy] [5:Anemia, Lymphedema, Congestive Heart Failure, Hypertension, Peripheral Venous Disease, Type II Diabetes, Neuropathy] Date Acquired: [1:06/11/2016] [2:06/11/2016] [5:06/11/2016] Weeks of Treatment: [1:15] [2:15] [5:15] Wound Status: [1:Open] [2:Open] [5:Open] Pending Amputation on Yes [2:Yes] [5:Yes] Presentation: Measurements L x W x D 0.9x5.2x0.2 [2:1x1.5x0.1] [5:0.6x0.7x0.1] (cm) Area (cm) : [1:3.676] [2:1.178] [5:0.33] Volume (cm) : [1:0.735] [2:0.118] [5:0.033] % Reduction in Area: [1:92.20%] [2:61.60%] [5:97.70%] % Reduction in Volume: 84.40% [2:61.60%] [5:97.70%] Classification: [1:Grade 1] [2:Grade 1] [5:Grade 1] Exudate Amount: [1:Large] [2:Large] [5:Large] Exudate Type: [1:Serosanguineous] [2:Serosanguineous] [5:Serous] Exudate Color: [1:red, brown] [2:red, brown] [5:amber] Foul Odor After [1:Yes] [2:Yes] [5:Yes] Cleansing: Shank, Davidmichael E. ([id number]) Odor  Anticipated Due to No No No Product Use: Wound Margin: Flat and Intact Flat and Intact Flat and Intact Granulation Amount: Medium (34-66%) Large (67-100%) Large (67-100%) Granulation Quality: Pink Pink Pink Necrotic Amount: Medium (34-66%) None Present (0%) Small (1-33%) Exposed Structures: Fat Layer (Subcutaneous Fat Layer (Subcutaneous Fat Layer (Subcutaneous Tissue) Exposed: Yes Tissue) Exposed: Yes Tissue) Exposed: Yes Fascia: No Fascia: No Fascia: No Tendon: No Tendon: No Tendon: No Muscle: No Muscle: No Muscle: No Joint: No Joint: No Joint: No Bone: No Bone: No Bone: No Epithelialization: Large (67-100%) Small (1-33%) Small (1-33%) Periwound Skin Texture: Excoriation: Yes Excoriation: Yes Excoriation: Yes Induration: No Induration: No Induration: No Callus: No Callus: No Callus: No Crepitus: No Crepitus: No Crepitus: No Rash: No Rash: No Rash: No Scarring: No Scarring: No Scarring: No Periwound Skin Maceration: Yes Maceration: Yes Maceration: No Moisture: Dry/Scaly: No Dry/Scaly: No Dry/Scaly: No Periwound Skin Color: Atrophie Blanche: No Atrophie Blanche: No Atrophie Blanche: No Cyanosis: No Cyanosis: No Cyanosis: No Ecchymosis: No Ecchymosis: No Ecchymosis: No Erythema: No Erythema: No Erythema: No Hemosiderin Staining: No Hemosiderin Staining: No Hemosiderin Staining: No Mottled: No Mottled: No Mottled: No Pallor: No Pallor: No Pallor: No Rubor: No Rubor: No Rubor: No Temperature: No Abnormality No Abnormality No Abnormality Tenderness on No No No Palpation: Wound Preparation: Ulcer Cleansing: Ulcer Cleansing: Ulcer Cleansing: Rinsed/Irrigated with Rinsed/Irrigated with Rinsed/Irrigated with Saline Saline Saline Topical Anesthetic Topical Anesthetic Topical Anesthetic Applied: None Applied: None Applied: None Wound Number: [id number]  Photos: No Photos No Photos No Photos Wound Location: Left Toe Second Left Toe Third Right Toe  Third Wounding Event: Gradually Appeared Gradually Appeared Gradually Appeared Primary 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 the Lower Extremity the Lower Extremity Comorbid History: Anemia, Lymphedema, Anemia, Lymphedema, Anemia, Lymphedema, Congestive Heart Failure, Congestive Heart Failure, Congestive Heart Failure, Hypertension, Peripheral Hypertension, Peripheral Hypertension, Peripheral Nickolson, Gregery E. ([id number]) Venous Disease, Type II Venous Disease, Type II Venous Disease, Type II Diabetes, Neuropathy Diabetes, Neuropathy Diabetes, Neuropathy Date Acquired: 06/11/2016 12/30/2016 12/31/2016 Weeks of Treatment: 15 1 1  Wound Status: Open Open Open Pending Amputation on Yes No No Presentation: Measurements L x W x D 0.5x0.7x0.1 0.5x0.4x0.1 0.6x0.4x0.1 (cm) Area (cm) : 0.275 0.157 0.188 Volume (cm) : 0.027 0.016 0.019 % Reduction in Area: 87.10% 80.40% 52.20% % Reduction in Volume: 87.40% 80.00% 51.30% Classification: Grade 1 Grade 1 Grade 1 Exudate Amount: Large Large Large Exudate Type: Serous Serosanguineous Serosanguineous Exudate Color: amber red, brown red, brown Foul Odor After Yes No No Cleansing: Odor Anticipated Due to No N/A N/A Product Use: Wound Margin: Flat and Intact Distinct, outline attached Flat and Intact Granulation Amount: Medium (34-66%) Large (67-100%) Large (67-100%) Granulation Quality: Pink Red Red Necrotic Amount:  Medium (34-66%) None Present (0%) None Present (0%) Exposed Structures: Fat Layer (Subcutaneous N/A N/A Tissue) Exposed: Yes Fascia: No Tendon: No Muscle: No Joint: No Bone: No Epithelialization: Small (1-33%) None None Periwound Skin Texture: Excoriation: Yes No Abnormalities Noted No Abnormalities Noted Induration: No Callus: No Crepitus: No Rash: No Scarring: No Periwound Skin Maceration: Yes Maceration: Yes Maceration: Yes Moisture: Dry/Scaly: No Periwound  Skin Color: Atrophie Blanche: No No Abnormalities Noted No Abnormalities Noted Cyanosis: No Ecchymosis: No Erythema: No Hemosiderin Staining: No Mottled: No Pallor: No Rubor: No Temperature: No Abnormality No Abnormality No Abnormality Giarratano, Oskar E. ([id number]) Tenderness on No Yes Yes Palpation: Wound Preparation: Ulcer Cleansing: Ulcer Cleansing: Ulcer Cleansing: Rinsed/Irrigated with Rinsed/Irrigated with Rinsed/Irrigated with Saline Saline Saline Topical Anesthetic Topical Anesthetic Applied: None Applied: None Treatment Notes Electronic Signature(s) Signed: 01/07/2017 9:53:23 AM By: Christin Fudge MD, FACS Entered By: Christin Fudge on 01/07/2017 09:53:22 [place], [name] ([id number]) -------------------------------------------------------------------------------- Multi-Disciplinary Care Plan Details Patient Name: [name], [name] 01/07/2017 9:15 Date of Service: AM Medical Record [id number] Number: Patient Account Number: [id number] Date of Birth/Sex: 1951/04/02 (66 y.o. Male) Treating RN: [name], Other Clinician: Primary Care Reymundo Winship: [name] Treating Britto, [name], Akio Hudnall/Extender: Referring Karter Hellmer: [name] in Treatment: 15 Active Inactive ` Abuse / Safety / Falls / Self Care Management Nursing Diagnoses: Potential for falls Goals: Patient will remain injury free related to falls Date Initiated: 09/21/2016 Target Resolution Date: 11/30/2016 Goal Status: Active Interventions: Assess fall risk on admission and as needed Notes: ` Nutrition Nursing Diagnoses: Potential for alteratiion in Nutrition/Potential for imbalanced nutrition Goals: Patient/caregiver agrees to and verbalizes understanding of need to use nutritional supplements and/or vitamins as prescribed Date Initiated: 09/21/2016 Target Resolution Date: 11/30/2016 Goal Status: Active Interventions: Assess patient nutrition upon admission and as needed  per policy Notes: ` Orientation to the [facility]. ([id number]) Nursing Diagnoses: Knowledge deficit related to the wound healing center program Goals: Patient/caregiver will verbalize understanding of the [facility] Program Date Initiated: 09/21/2016 Target Resolution Date: 11/30/2016 Goal Status: Active Interventions: Provide education on orientation to the wound center Notes: ` Wound/Skin Impairment Nursing Diagnoses: Knowledge deficit related to smoking impact on wound healing Goals: Ulcer/skin breakdown will have a volume reduction of 30% by week 4 Date Initiated: 09/21/2016 Target Resolution Date: 11/30/2016 Goal Status: Active Ulcer/skin breakdown will have a volume reduction of 50% by week 8 Date Initiated: 09/21/2016 Target Resolution Date: 11/30/2016 Goal Status: Active Ulcer/skin breakdown will have a volume reduction of 80% by week 12 Date Initiated: 09/21/2016 Target Resolution Date: 11/30/2016 Goal Status: Active Ulcer/skin breakdown will heal within 14 weeks Date Initiated: 09/21/2016 Target Resolution Date: 11/30/2016 Goal Status: Active Interventions: Assess patient/caregiver ability to obtain necessary supplies Assess patient/caregiver ability to perform ulcer/skin care regimen upon admission and as needed Assess ulceration(s) every visit Notes: Electronic Signature(s) Signed: 01/07/2017 5:21:31 PM By: [name] Entered By: [name] on 01/07/2017 09:46:16 Mikes, [name] ([id number]) -------------------------------------------------------------------------------- Pain Assessment Details Patient Name: [name] 01/07/2017 9:15 Date of Service: AM Medical Record [id number] Number: Patient Account Number: [id number] Date of Birth/Sex: June 16, 1950 (66 y.o. Male) Treating RN: [name], Other Clinician: Primary Care Kahli Fitzgerald: [name] Treating Britto, [name],  Tc Kapusta/Extender: Referring Delesa Kawa: [name] in Treatment: 15 Active Problems Location of Pain Severity and Description of Pain Patient Has Paino No Site Locations Pain Management and Medication Current Pain Management: Electronic Signature(s) Signed: 01/07/2017 5:21:31 PM By: [name] Entered By: [name] on 01/07/2017  19:50:93 [name], [name] ([id number]) -------------------------------------------------------------------------------- Patient/Caregiver Education Details Patient Name: [name], [name] 01/07/2017 9:15 Date of Service: AM Medical Record [id number] Number: Patient Account Number: [id number] Date of Birth/Gender: January 07, 1951 (66 y.o. Male) Treating RN: [name], Other Clinician: Primary Care Physician: [name] Treating Britto, [name], Physician/Extender: Referring Physician: Harless [name] in Treatment: 15 Education Assessment Education Provided To: Patient Education Topics Provided Wound/Skin Impairment: Handouts: Other: wound care as ordered Methods: Demonstration, Explain/Verbal Responses: State content correctly Electronic Signature(s) Signed: 01/07/2017 5:21:31 PM By: [name] Entered By: [name] on 01/07/2017 10:09:14 Badgett, [name] ([id number]) -------------------------------------------------------------------------------- Wound Assessment Details Patient Name: [name] 01/07/2017 9:15 Date of Service: AM Medical Record [id number] Number: Patient Account Number: [id number] Date of Birth/Sex: 1950/07/10 (66 y.o. Male) Treating RN: [name], Other Clinician: Primary Care Jazz Biddy: [name] Treating Britto, [name], Shakeeta Godette/Extender: Referring Cyris Maalouf: [name] in Treatment: 15 Wound Status Wound Number: 1 Primary Diabetic Wound/Ulcer of the Lower Etiology: Extremity Wound Location: Right Toe Great Wound Open Wounding Event: Gradually  Appeared Status: Date Acquired: 06/11/2016 Comorbid Anemia, Lymphedema, Congestive Weeks Of Treatment: 15 History: Heart Failure, Hypertension, Peripheral Clustered Wound: No Venous Disease, Type II Diabetes, Pending Amputation On Presentation Neuropathy Photos Photo Uploaded By: [name] on 01/07/2017 11:39:06 Wound Measurements Length: (cm) 0.9 Width: (cm) 5.2 Depth: (cm) 0.2 Area: (cm) 3.676 Volume: (cm) 0.735 % Reduction in Area: 92.2% % Reduction in Volume: 84.4% Epithelialization: Large (67-100%) Tunneling: No Undermining: No Wound Description Classification: Grade 1 Foul Odor After Wound Margin: Flat and Intact Due to Product Exudate Amount: Large Slough/Fibrino Exudate Type: Serosanguineous Exudate Color: red, brown Cleansing: Yes Use: No Yes Wound Bed Hynson, Audy E. ([id number]) Granulation Amount: Medium (34-66%) Exposed Structure Granulation Quality: Pink Fascia Exposed: No Necrotic Amount: Medium (34-66%) Fat Layer (Subcutaneous Tissue) Exposed: Yes Necrotic Quality: Adherent Slough Tendon Exposed: No Muscle Exposed: No Joint Exposed: No Bone Exposed: No Periwound Skin Texture Texture Color No Abnormalities Noted: No No Abnormalities Noted: No Callus: No Atrophie Blanche: No Crepitus: No Cyanosis: No Excoriation: Yes Ecchymosis: No Induration: No Erythema: No Rash: No Hemosiderin Staining: No Scarring: No Mottled: No Pallor: No Moisture Rubor: No No Abnormalities Noted: No Dry / Scaly: No Temperature / Pain Maceration: Yes Temperature: No Abnormality Wound Preparation Ulcer Cleansing: Rinsed/Irrigated with Saline Topical Anesthetic Applied: None Treatment Notes Wound #1 (Right Toe Great) 1. Cleansed with: Clean wound with Normal Saline 3. Peri-wound Care: Antifungal cream 4. Dressing Applied: Aquacel Ag 5. Secondary Dressing Applied Dry Gauze Kerlix/Conform 7. Secured with Tape Notes netting Electronic  Signature(s) Signed: 01/07/2017 5:21:31 PM By: [name] Entered By: [name] on 01/07/2017 09:36:31 Ferrebee, AKSHAJ BESANCON ([id number]) Nance, JADAVION SPOELSTRA ([id number]) -------------------------------------------------------------------------------- Wound Assessment Details Patient Name: [name], [name] 01/07/2017 9:15 Date of Service: AM Medical Record [id number] Number: Patient Account Number: [id number] Date of Birth/Sex: Jun 03, 1950 (66 y.o. Male) Treating RN: [name], Other Clinician: Primary Care Tevita Gomer: [name] Treating Britto, [name], Davonda Ausley/Extender: Referring Alec Jaros: [name] in Treatment: 15 Wound Status Wound Number: 2 Primary Diabetic Wound/Ulcer of the Lower Etiology: Extremity Wound Location: Right Toe Second Wound Open Wounding Event: Gradually Appeared Status: Date Acquired: 06/11/2016 Comorbid Anemia, Lymphedema, Congestive Weeks Of Treatment: 15 History: Heart Failure, Hypertension, Peripheral Clustered Wound: No Venous Disease, Type II Diabetes, Pending Amputation On Presentation Neuropathy Photos Photo Uploaded By: [name] on 01/07/2017 11:39:42 Wound Measurements Length: (cm) 1 Width: (cm) 1.5 Depth: (cm) 0.1 Area: (cm) 1.178 Volume: (cm) 0.118 % Reduction in Area:  61.6% % Reduction in Volume: 61.6% Epithelialization: Small (1-33%) Tunneling: No Undermining: No Wound Description Classification: Grade 1 Foul Odor After Wound Margin: Flat and Intact Due to Product Exudate Amount: Large Slough/Fibrino Exudate Type: Serosanguineous Exudate Color: red, brown Cleansing: Yes Use: No Yes Wound Bed Goldenstein, Equan E. ([id number]) Granulation Amount: Large (67-100%) Exposed Structure Granulation Quality: Pink Fascia Exposed: No Necrotic Amount: None Present (0%) Fat Layer (Subcutaneous Tissue) Exposed: Yes Tendon Exposed: No Muscle Exposed: No Joint Exposed: No Bone Exposed: No Periwound  Skin Texture Texture Color No Abnormalities Noted: No No Abnormalities Noted: No Callus: No Atrophie Blanche: No Crepitus: No Cyanosis: No Excoriation: Yes Ecchymosis: No Induration: No Erythema: No Rash: No Hemosiderin Staining: No Scarring: No Mottled: No Pallor: No Moisture Rubor: No No Abnormalities Noted: No Dry / Scaly: No Temperature / Pain Maceration: Yes Temperature: No Abnormality Wound Preparation Ulcer Cleansing: Rinsed/Irrigated with Saline Topical Anesthetic Applied: None Treatment Notes Wound #2 (Right Toe Second) 1. Cleansed with: Clean wound with Normal Saline 3. Peri-wound Care: Antifungal cream 4. Dressing Applied: Aquacel Ag 5. Secondary Dressing Applied Dry Gauze Kerlix/Conform 7. Secured with Tape Notes netting Electronic Signature(s) Signed: 01/07/2017 5:21:31 PM By: [name] Entered By: [name] on 01/07/2017 09:36:46 [place], EWIN REHBERG ([id number]) [place], ROMELLO HOEHN ([id number]) -------------------------------------------------------------------------------- Wound Assessment Details Patient Name: [name], [name] 01/07/2017 9:15 Date of Service: AM Medical Record [id number] Number: Patient Account Number: [id number] Date of Birth/Sex: March 18, 1951 (66 y.o. Male) Treating RN: [name], Other Clinician: Primary Care Omega Durante: [name] Treating Britto, [name], Tylyn Stankovich/Extender: Referring Nalin Mazzocco: [name] in Treatment: 15 Wound Status Wound Number: 5 Primary Diabetic Wound/Ulcer of the Lower Etiology: Extremity Wound Location: Left Toe Great Wound Open Wounding Event: Gradually Appeared Status: Date Acquired: 06/11/2016 Comorbid Anemia, Lymphedema, Congestive Weeks Of Treatment: 15 History: Heart Failure, Hypertension, Peripheral Clustered Wound: No Venous Disease, Type II Diabetes, Pending Amputation On Presentation Neuropathy Photos Photo Uploaded By: [name] on 01/07/2017  11:39:43 Wound Measurements Length: (cm) 0.6 Width: (cm) 0.7 Depth: (cm) 0.1 Area: (cm) 0.33 Volume: (cm) 0.033 % Reduction in Area: 97.7% % Reduction in Volume: 97.7% Epithelialization: Small (1-33%) Tunneling: No Undermining: No Wound Description Classification: Grade 1 Foul Odor After Wound Margin: Flat and Intact Due to Product Exudate Amount: Large Slough/Fibrino Exudate Type: Serous Exudate Color: amber Cleansing: Yes Use: No Yes Wound Bed Zachery, Huntington E. ([id number]) Granulation Amount: Large (67-100%) Exposed Structure Granulation Quality: Pink Fascia Exposed: No Necrotic Amount: Small (1-33%) Fat Layer (Subcutaneous Tissue) Exposed: Yes Necrotic Quality: Adherent Slough Tendon Exposed: No Muscle Exposed: No Joint Exposed: No Bone Exposed: No Periwound Skin Texture Texture Color No Abnormalities Noted: No No Abnormalities Noted: No Callus: No Atrophie Blanche: No Crepitus: No Cyanosis: No Excoriation: Yes Ecchymosis: No Induration: No Erythema: No Rash: No Hemosiderin Staining: No Scarring: No Mottled: No Pallor: No Moisture Rubor: No No Abnormalities Noted: No Dry / Scaly: No Temperature / Pain Maceration: No Temperature: No Abnormality Wound Preparation Ulcer Cleansing: Rinsed/Irrigated with Saline Topical Anesthetic Applied: None Treatment Notes Wound #5 (Left Toe Great) 1. Cleansed with: Clean wound with Normal Saline 3. Peri-wound Care: Antifungal cream 4. Dressing Applied: Aquacel Ag 5. Secondary Dressing Applied Dry Gauze Kerlix/Conform 7. Secured with Tape Notes netting Electronic Signature(s) Signed: 01/07/2017 5:21:31 PM By: [name] Entered By: [name] on 01/07/2017 09:36:58 Liberman, LEOCADIO HEAL ([id number]) CHASTIN, RIESGO ([id number]) -------------------------------------------------------------------------------- Wound Assessment Details Patient Name: CYNCERE, SONTAG 01/07/2017 9:15 Date of  Service: AM Medical Record [id number] Number: Patient  Account Number: [id number] Date of Birth/Sex: 11-06-1950 (66 y.o. Male) Treating RN: [name], Other Clinician: Primary Care Eriq Hufford: [name] Treating Britto, [name], Harish Bram/Extender: Referring Chavonne Sforza: [name] in Treatment: 15 Wound Status Wound Number: 6 Primary Diabetic Wound/Ulcer of the Lower Etiology: Extremity Wound Location: Left Toe Second Wound Open Wounding Event: Gradually Appeared Status: Date Acquired: 06/11/2016 Comorbid Anemia, Lymphedema, Congestive Weeks Of Treatment: 15 History: Heart Failure, Hypertension, Peripheral Clustered Wound: No Venous Disease, Type II Diabetes, Pending Amputation On Presentation Neuropathy Photos Photo Uploaded By: [name] on 01/07/2017 11:41:03 Wound Measurements Length: (cm) 0.5 Width: (cm) 0.7 Depth: (cm) 0.1 Area: (cm) 0.275 Volume: (cm) 0.027 % Reduction in Area: 87.1% % Reduction in Volume: 87.4% Epithelialization: Small (1-33%) Tunneling: No Undermining: No Wound Description Classification: Grade 1 Foul Odor After Wound Margin: Flat and Intact Due to Product Exudate Amount: Large Slough/Fibrino Exudate Type: Serous Exudate Color: amber Cleansing: Yes Use: No Yes Wound Bed Ivy, Izear E. ([id number]) Granulation Amount: Medium (34-66%) Exposed Structure Granulation Quality: Pink Fascia Exposed: No Necrotic Amount: Medium (34-66%) Fat Layer (Subcutaneous Tissue) Exposed: Yes Necrotic Quality: Adherent Slough Tendon Exposed: No Muscle Exposed: No Joint Exposed: No Bone Exposed: No Periwound Skin Texture Texture Color No Abnormalities Noted: No No Abnormalities Noted: No Callus: No Atrophie Blanche: No Crepitus: No Cyanosis: No Excoriation: Yes Ecchymosis: No Induration: No Erythema: No Rash: No Hemosiderin Staining: No Scarring: No Mottled: No Pallor: No Moisture Rubor: No No  Abnormalities Noted: No Dry / Scaly: No Temperature / Pain Maceration: Yes Temperature: No Abnormality Wound Preparation Ulcer Cleansing: Rinsed/Irrigated with Saline Topical Anesthetic Applied: None Treatment Notes Wound #6 (Left Toe Second) 1. Cleansed with: Clean wound with Normal Saline 3. Peri-wound Care: Antifungal cream 4. Dressing Applied: Aquacel Ag 5. Secondary Dressing Applied Dry Gauze Kerlix/Conform 7. Secured with Tape Notes netting Electronic Signature(s) Signed: 01/07/2017 5:21:31 PM By: [name] Entered By: [name] on 01/07/2017 09:37:15 Reddick, CAESAR MANNELLA ([id number]) Loos, KYRE JEFFRIES ([id number]) -------------------------------------------------------------------------------- Wound Assessment Details Patient Name: [name], [name] 01/07/2017 9:15 Date of Service: AM Medical Record [id number] Number: Patient Account Number: [id number] Date of Birth/Sex: November 02, 1950 (66 y.o. Male) Treating RN: [name], Other Clinician: Primary Care Mabel Unrein: [name] Treating Britto, [name], Promyse Ardito/Extender: Referring Shivaan Tierno: [name] in Treatment: 15 Wound Status Wound Number: 8 Primary Diabetic Wound/Ulcer of the Lower Etiology: Extremity Wound Location: Left Toe Third Wound Open Wounding Event: Gradually Appeared Status: Date Acquired: 12/30/2016 Comorbid Anemia, Lymphedema, Congestive Weeks Of Treatment: 1 History: Heart Failure, Hypertension, Peripheral Clustered Wound: No Venous Disease, Type II Diabetes, Neuropathy Photos Photo Uploaded By: [name] on 01/07/2017 11:41:04 Wound Measurements Length: (cm) 0.5 Width: (cm) 0.4 Depth: (cm) 0.1 Area: (cm) 0.157 Volume: (cm) 0.016 % Reduction in Area: 80.4% % Reduction in Volume: 80% Epithelialization: None Tunneling: No Undermining: No Wound Description Classification: Grade 1 Foul Odor After Wound Margin: Distinct, outline attached  Slough/Fibrino Exudate Amount: Large Exudate Type: Serosanguineous Exudate Color: red, brown Cleansing: No Yes Wound Bed Eastman, Stclair E. ([id number]) Granulation Amount: Large (67-100%) Granulation Quality: Red Necrotic Amount: None Present (0%) Periwound Skin Texture Texture Color No Abnormalities Noted: No No Abnormalities Noted: No Moisture Temperature / Pain No Abnormalities Noted: No Temperature: No Abnormality Maceration: Yes Tenderness on Palpation: Yes Wound Preparation Ulcer Cleansing: Rinsed/Irrigated with Saline Treatment Notes Wound #8 (Left Toe Third) 1. Cleansed with: Clean wound with Normal Saline 3. Peri-wound Care: Antifungal cream 4. Dressing Applied: Aquacel Ag 5. Secondary Dressing Applied Dry Gauze Kerlix/Conform 7.  Secured with Tape Notes netting Electronic Signature(s) Signed: 01/07/2017 5:21:31 PM By: [name] Entered By: [name] on 01/07/2017 09:37:29 Gillen, [name] ([id number]) -------------------------------------------------------------------------------- Wound Assessment Details Patient Name: [name], [name] 01/07/2017 9:15 Date of Service: AM Medical Record [id number] Number: Patient Account Number: [id number] Date of Birth/Sex: [birth year] (66 y.o. Male) Treating RN: [name], Other Clinician: Primary Care Donny Heffern: [name] Treating Britto, [name], Donathan Buller/Extender: Referring Kindall Swaby: [name] in Treatment: 15 Wound Status Wound Number: 9 Primary Diabetic Wound/Ulcer of the Lower Etiology: Extremity Wound Location: Right Toe Third Wound Open Wounding Event: Gradually Appeared Status: Date Acquired: 12/31/2016 Comorbid Anemia, Lymphedema, Congestive Weeks Of Treatment: 1 History: Heart Failure, Hypertension, Peripheral Clustered Wound: No Venous Disease, Type II Diabetes, Neuropathy Photos Photo Uploaded By: [name] on 01/07/2017 11:42:09 Wound  Measurements Length: (cm) 0.6 Width: (cm) 0.4 Depth: (cm) 0.1 Area: (cm) 0.188 Volume: (cm) 0.019 % Reduction in Area: 52.2% % Reduction in Volume: 51.3% Epithelialization: None Tunneling: No Undermining: No Wound Description Classification: Grade 1 Foul Odor After Wound Margin: Flat and Intact Slough/Fibrino Exudate Amount: Large Exudate Type: Serosanguineous Exudate Color: red, brown Cleansing: No Yes Wound Bed Bieser, Liam E. ([id number]) Granulation Amount: Large (67-100%) Granulation Quality: Red Necrotic Amount: None Present (0%) Periwound Skin Texture Texture Color No Abnormalities Noted: No No Abnormalities Noted: No Moisture Temperature / Pain No Abnormalities Noted: No Temperature: No Abnormality Maceration: Yes Tenderness on Palpation: Yes Wound Preparation Ulcer Cleansing: Rinsed/Irrigated with Saline Topical Anesthetic Applied: None Treatment Notes Wound #9 (Right Toe Third) 1. Cleansed with: Clean wound with Normal Saline 3. Peri-wound Care: Antifungal cream 4. Dressing Applied: Aquacel Ag 5. Secondary Dressing Applied Dry Gauze Kerlix/Conform 7. Secured with Tape Notes netting Electronic Signature(s) Signed: 01/07/2017 5:21:31 PM By: [name] Entered By: [name] on 01/07/2017 09:37:41 Obriant, [name] ([id number]) -------------------------------------------------------------------------------- Vitals Details Patient Name: [name], [name] 01/07/2017 9:15 Date of Service: AM Medical Record [id number] Number: Patient Account Number: [id number] Date of Birth/Sex: 1950-04-11 (66 y.o. Male) Treating RN: [name], Other Clinician: Primary Care Ilean Spradlin: [name] Treating Britto, [name], Tamatha Gadbois/Extender: Referring Tavius Turgeon: [name] in Treatment: 15 Vital Signs Time Taken: 09:27 Temperature (F): 98.4 Height (in): 69 Pulse (bpm): 62 Weight (lbs): 168 Respiratory Rate (breaths/min):  16 Body Mass Index (BMI): 24.8 Blood Pressure (mmHg): 151/81 Reference Range: 80 - 120 mg / dl Electronic Signature(s) Signed: 01/07/2017 5:21:31 PM By: [name] Entered By: [name] on 01/07/2017 09:29:50

## 2017-01-14 ENCOUNTER — Encounter: Payer: Medicare HMO | Admitting: Surgery

## 2017-01-14 DIAGNOSIS — E11621 Type 2 diabetes mellitus with foot ulcer: Secondary | ICD-10-CM | POA: Diagnosis not present

## 2017-01-15 NOTE — Progress Notes (Addendum)
CHAVIS, TESSLER ([id number]) Visit Report for 01/14/2017 Arrival Information Details Patient Name: [name], [name] 01/14/2017 11:00 Date of Service: AM Medical Record [id number] Number: Patient Account Number: [id number] Date of Birth/Sex: 01/19/1951 (66 y.o. Male) Treating RN: Elyn Peers, Other Clinician: Primary Care Javad Salva: Olivia Mackie Treating Britto, Cherie Ouch, Lorre Opdahl/Extender: Referring Duan Scharnhorst: Harless Nakayama in Treatment: 16 Visit Information History Since Last Visit Added or deleted any medications: No Patient Arrived: Ambulatory Any new allergies or adverse reactions: No Arrival Time: 11:12 Had a fall or experienced change in No Accompanied By: brother activities of daily living that may affect Transfer Assistance: None risk of falls: Patient Identification Verified: Yes Signs or symptoms of abuse/neglect since last No Secondary Verification Process Yes visito Completed: Hospitalized since last visit: No Patient Requires Transmission-Based No Has Dressing in Place as Prescribed: Yes Precautions: Pain Present Now: No Patient Has Alerts: Yes Patient Alerts: DMII Electronic Signature(s) Signed: 01/14/2017 4:58:11 PM By: Montey Hora Entered By: Montey Hora on 01/14/2017 11:12:33 Feger, [name] ([id number]) -------------------------------------------------------------------------------- Clinic Level of Care Assessment Details Patient Name: [name], [name] 01/14/2017 11:00 Date of Service: AM Medical Record [id number] Number: Patient Account Number: [id number] Date of Birth/Sex: 1950-12-28 (66 y.o. Male) Treating RN: Josefa Half, Other Clinician: Primary Care Carmello Cabiness: Olivia Mackie Treating Britto, Cherie Ouch, Avriana Joo/Extender: Referring Michaelia Beilfuss: Harless Nakayama in Treatment: 16 Clinic Level of Care Assessment Items TOOL 4 Quantity Score []  - Use when only an EandM is performed on FOLLOW-UP visit  0 ASSESSMENTS - Nursing Assessment / Reassessment []  - Reassessment of Co-morbidities (includes updates in patient status) 0 X - Reassessment of Adherence to Treatment Plan 1 5 ASSESSMENTS - Wound and Skin Assessment / Reassessment []  - Simple Wound Assessment / Reassessment - one wound 0 X - Complex Wound Assessment / Reassessment - multiple wounds 6 5 []  - Dermatologic / Skin Assessment (not related to wound area) 0 ASSESSMENTS - Focused Assessment []  - Circumferential Edema Measurements - multi extremities 0 []  - Nutritional Assessment / Counseling / Intervention 0 []  - Lower Extremity Assessment (monofilament, tuning fork, pulses) 0 []  - Peripheral Arterial Disease Assessment (using hand held doppler) 0 ASSESSMENTS - Ostomy and/or Continence Assessment and Care []  - Incontinence Assessment and Management 0 []  - Ostomy Care Assessment and Management (repouching, etc.) 0 PROCESS - Coordination of Care X - Simple Patient / Family Education for ongoing care 1 15 []  - Complex (extensive) Patient / Family Education for ongoing care 0 []  - Staff obtains Consents, Records, Test Results / Process Orders 0 Dockter, Ebony E. ([id number]) []  - Staff telephones HHA, Nursing Homes / Clarify orders / etc 0 []  - Routine Transfer to another Facility (non-emergent condition) 0 []  - Routine Hospital Admission (non-emergent condition) 0 []  - New Admissions / Biomedical engineer / Ordering NPWT, Apligraf, etc. 0 []  - Emergency Hospital Admission (emergent condition) 0 X - Simple Discharge Coordination 1 10 []  - Complex (extensive) Discharge Coordination 0 PROCESS - Special Needs []  - Pediatric / Minor Patient Management 0 []  - Isolation Patient Management 0 []  - Hearing / Language / Visual special needs 0 []  - Assessment of Community assistance (transportation, D/C planning, etc.) 0 []  - Additional assistance / Altered mentation 0 []  - Support Surface(s) Assessment (bed, cushion, seat, etc.)  0 INTERVENTIONS - Wound Cleansing / Measurement []  - Simple Wound Cleansing - one wound 0 X - Complex Wound Cleansing - multiple wounds 6 5 X - Wound Imaging (photographs - any number of wounds) 1 5 []  -  Wound Tracing (instead of photographs) 0 []  - Simple Wound Measurement - one wound 0 X - Complex Wound Measurement - multiple wounds 6 5 INTERVENTIONS - Wound Dressings []  - Small Wound Dressing one or multiple wounds 0 []  - Medium Wound Dressing one or multiple wounds 0 X - Large Wound Dressing one or multiple wounds 2 20 []  - Application of Medications - topical 0 []  - Application of Medications - injection 0 Ghanem, Jadyn E. ([id number]) INTERVENTIONS - Miscellaneous []  - External ear exam 0 []  - Specimen Collection (cultures, biopsies, blood, body fluids, etc.) 0 []  - Specimen(s) / Culture(s) sent or taken to Lab for analysis 0 []  - Patient Transfer (multiple staff / Harrel Lemon Lift / Similar devices) 0 []  - Simple Staple / Suture removal (25 or less) 0 []  - Complex Staple / Suture removal (26 or more) 0 []  - Hypo / Hyperglycemic Management (close monitor of Blood Glucose) 0 []  - Ankle / Brachial Index (ABI) - do not check if billed separately 0 X - Vital Signs 1 5 Has the patient been seen at the hospital within the last three years: Yes Total Score: 170 Level Of Care: New/Established - Level 5 Electronic Signature(s) Signed: 01/15/2017 5:44:12 PM By: Gretta Cool, BSN, RN, CWS, Kim RN, BSN Entered By: Gretta Cool, BSN, RN, CWS, Kim on 01/14/2017 11:42:57 Curb, [name] ([id number]) -------------------------------------------------------------------------------- Complex / Palliative Patient Assessment Details Patient Name: [name], [name] 01/14/2017 11:00 Date of Service: AM Medical Record [id number] Number: Patient Account Number: [id number] Date of Birth/Sex: 11/24/1950 (66 y.o. Male) Treating RN: Josefa Half, Other Clinician: Primary Care Jennett Tarbell: Olivia Mackie Treating Britto,  Cherie Ouch, Detrell Umscheid/Extender: Referring Declan Mier: Harless Nakayama in Treatment: 16 Palliative Management Criteria Complex Wound Management Criteria Evaluation by a vascular surgeon has determined that the patient is not a revascularization candidate due to: All procedures have been completed Care Approach Wound Care Plan: Complex Wound Management Electronic Signature(s) Signed: 01/15/2017 4:55:42 PM By: Gretta Cool, BSN, RN, CWS, Kim RN, BSN Signed: 01/17/2017 8:53:04 AM By: Christin Fudge MD, FACS Entered By: Gretta Cool, BSN, RN, CWS, Kim on 01/15/2017 16:55:42 Izard, [name] ([id number]) -------------------------------------------------------------------------------- Encounter Discharge Information Details Patient Name: [name], [name] 01/14/2017 11:00 Date of Service: AM Medical Record [id number] Number: Patient Account Number: [id number] Date of Birth/Sex: 1950-09-01 (67 y.o. Male) Treating RN: Anastasio Champion, Other Clinician: Primary Care Brenyn Petrey: TRACY Treating Britto, Cherie Ouch, Karrisa Didio/Extender: Referring Ranetta Armacost: Harless Nakayama in Treatment: 16 Encounter Discharge Information Items Discharge Pain Level: 0 Discharge Condition: Stable Ambulatory Status: Ambulatory Discharge Destination: Home Transportation: Private Auto Accompanied By: family Schedule Follow-up Appointment: Yes Medication Reconciliation completed and provided to Patient/Care No Brynlynn Walko: Provided on Clinical Summary of Care: 01/14/2017 Form Type Recipient Paper Patient ED Electronic Signature(s) Signed: 01/15/2017 11:04:46 AM By: Ruthine Dose Entered By: Ruthine Dose on 01/14/2017 11:45:35 Ledwith, [name] ([id number]) -------------------------------------------------------------------------------- Lower Extremity Assessment Details Patient Name: [name], [name] 01/14/2017 11:00 Date of Service: AM Medical Record [id number] Number: Patient Account Number:  [id number] Date of Birth/Sex: 14-May-1950 (66 y.o. Male) Treating RN: Elyn Peers, Other Clinician: Primary Care Torien Ramroop: Olivia Mackie Treating Britto, Cherie Ouch, Irie Fiorello/Extender: Referring Nitzia Perren: Harless Nakayama in Treatment: 16 Vascular Assessment Pulses: Dorsalis Pedis Palpable: [Left:Yes] [Right:Yes] Posterior Tibial Extremity colors, hair growth, and conditions: Extremity Color: [Left:Hyperpigmented] [Right:Hyperpigmented] Temperature of Extremity: [Left:Warm] [Right:Warm] Capillary Refill: [Left:> 3 seconds] [Right:> 3 seconds] Toe Nail Assessment Left: Right: Thick: Yes Yes Discolored: Yes Yes Deformed: Yes Yes Improper Length and Hygiene: Yes Yes Electronic Signature(s) Signed: 01/14/2017 4:58:11  PM By: Montey Hora Entered By: Montey Hora on 01/14/2017 11:20:08 Kornegay, [name] ([id number]) -------------------------------------------------------------------------------- Multi Wound Chart Details Patient Name: [name], [name] 01/14/2017 11:00 Date of Service: AM Medical Record [id number] Number: Patient Account Number: [id number] Date of Birth/Sex: 1951-03-22 (66 y.o. Male) Treating RN: Anastasio Champion, Other Clinician: Primary Care Rooney Swails: Olivia Mackie Treating Britto, Cherie Ouch, Mikia Delaluz/Extender: Referring Shamina Etheridge: Harless Nakayama in Treatment: 16 Vital Signs Height(in): 69 Pulse(bpm): 67 Weight(lbs): 168 Blood Pressure 145/72 (mmHg): Body Mass Index(BMI): 25 Temperature(F): 98.2 Respiratory Rate 18 (breaths/min): Photos: [1:No Photos] [2:No Photos] [5:No Photos] Wound Location: [1:Right Toe Great] [2:Right Toe Second] [5:Left Toe Great] Wounding Event: [1:Gradually Appeared] [2:Gradually Appeared] [5:Gradually Appeared] Primary Etiology: [1:Diabetic Wound/Ulcer of the Lower Extremity] [2:Diabetic Wound/Ulcer of the Lower Extremity] [5:Diabetic Wound/Ulcer of the Lower Extremity] Comorbid History:  [1:Anemia, Lymphedema, Congestive Heart Failure, Hypertension, Peripheral Venous Disease, Type II Diabetes, Neuropathy] [2:Anemia, Lymphedema, Congestive Heart Failure, Hypertension, Peripheral Venous Disease, Type II Diabetes,  Neuropathy] [5:Anemia, Lymphedema, Congestive Heart Failure, Hypertension, Peripheral Venous Disease, Type II Diabetes, Neuropathy] Date Acquired: [1:06/11/2016] [2:06/11/2016] [5:06/11/2016] Weeks of Treatment: [1:16] [2:16] [5:16] Wound Status: [1:Open] [2:Open] [5:Open] Pending Amputation on Yes [2:Yes] [5:Yes] Presentation: Measurements L x W x D 1x5.5x0.2 [2:0.9x1.3x0.1] [5:0.6x0.5x0.1] (cm) Area (cm) : [1:4.32] [2:0.919] [5:0.236] Volume (cm) : [1:0.864] [2:0.092] [5:0.024] % Reduction in Area: [1:90.80%] [2:70.10%] [5:98.30%] % Reduction in Volume: 81.70% [2:70.00%] [5:98.30%] Classification: [1:Grade 1] [2:Grade 1] [5:Grade 1] Exudate Amount: [1:Large] [2:Large] [5:Large] Exudate Type: [1:Serosanguineous] [2:Serosanguineous] [5:Serous] Exudate Color: [1:red, brown] [2:red, brown] [5:amber] Foul Odor After [1:Yes] [2:Yes] [5:Yes] Cleansing: Biswas, Leman E. ([id number]) Odor Anticipated Due to No No No Product Use: Wound Margin: Flat and Intact Flat and Intact Flat and Intact Granulation Amount: Medium (34-66%) Large (67-100%) Large (67-100%) Granulation Quality: Pink Pink Pink Necrotic Amount: Medium (34-66%) None Present (0%) Small (1-33%) Exposed Structures: Fat Layer (Subcutaneous Fat Layer (Subcutaneous Fat Layer (Subcutaneous Tissue) Exposed: Yes Tissue) Exposed: Yes Tissue) Exposed: Yes Fascia: No Fascia: No Fascia: No Tendon: No Tendon: No Tendon: No Muscle: No Muscle: No Muscle: No Joint: No Joint: No Joint: No Bone: No Bone: No Bone: No Epithelialization: Large (67-100%) Small (1-33%) Small (1-33%) Periwound Skin Texture: Excoriation: Yes Excoriation: Yes Excoriation: Yes Induration: No Induration: No Induration: No Callus:  No Callus: No Callus: No Crepitus: No Crepitus: No Crepitus: No Rash: No Rash: No Rash: No Scarring: No Scarring: No Scarring: No Periwound Skin Maceration: Yes Maceration: Yes Maceration: No Moisture: Dry/Scaly: No Dry/Scaly: No Dry/Scaly: No Periwound Skin Color: Atrophie Blanche: No Atrophie Blanche: No Atrophie Blanche: No Cyanosis: No Cyanosis: No Cyanosis: No Ecchymosis: No Ecchymosis: No Ecchymosis: No Erythema: No Erythema: No Erythema: No Hemosiderin Staining: No Hemosiderin Staining: No Hemosiderin Staining: No Mottled: No Mottled: No Mottled: No Pallor: No Pallor: No Pallor: No Rubor: No Rubor: No Rubor: No Temperature: No Abnormality No Abnormality No Abnormality Tenderness on No No No Palpation: Wound Preparation: Ulcer Cleansing: Ulcer Cleansing: Ulcer Cleansing: Rinsed/Irrigated with Rinsed/Irrigated with Rinsed/Irrigated with Saline Saline Saline Topical Anesthetic Topical Anesthetic Topical Anesthetic Applied: None Applied: None Applied: None Wound Number: [id number] [phone]  Photos: No Photos No Photos No Photos Wound Location: Left Toe Second Left Toe Third Right Toe Third Wounding Event: Gradually Appeared Gradually Appeared Gradually Appeared Primary 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 the Lower Extremity the Lower Extremity Comorbid History: Anemia, Lymphedema, Anemia, Lymphedema, N/A Congestive Heart Failure, Congestive Heart Failure, Hypertension, Peripheral Hypertension, Peripheral Masley, Zaccheaus E. ([id number]) Venous  Disease, Type II Venous Disease, Type II Diabetes, Neuropathy Diabetes, Neuropathy Date Acquired: 06/11/2016 12/30/2016 12/31/2016 Weeks of Treatment: 16 2 2  Wound Status: Open Open Open Pending Amputation on Yes No No Presentation: Measurements L x W x D 1.5x1.5x0.1 0.5x0.6x0.1 0.4x0.5x0.1 (cm) Area (cm) : 1.767 0.236 0.157 Volume (cm) : 0.177 0.024  0.016 % Reduction in Area: 17.30% 70.50% 60.10% % Reduction in Volume: 17.30% 70.00% 59.00% Classification: Grade 1 Grade 1 Grade 1 Exudate Amount: Large Large N/A Exudate Type: Serous Serosanguineous N/A Exudate Color: amber red, brown N/A Foul Odor After Yes No N/A Cleansing: Odor Anticipated Due to No N/A N/A Product Use: Wound Margin: Flat and Intact Distinct, outline attached N/A Granulation Amount: Medium (34-66%) Large (67-100%) N/A Granulation Quality: Pink Red N/A Necrotic Amount: Medium (34-66%) None Present (0%) N/A Exposed Structures: Fat Layer (Subcutaneous N/A N/A Tissue) Exposed: Yes Fascia: No Tendon: No Muscle: No Joint: No Bone: No Epithelialization: Small (1-33%) None N/A Periwound Skin Texture: Excoriation: Yes No Abnormalities Noted No Abnormalities Noted Induration: No Callus: No Crepitus: No Rash: No Scarring: No Periwound Skin Maceration: Yes Maceration: Yes No Abnormalities Noted Moisture: Dry/Scaly: No Periwound Skin Color: Atrophie Blanche: No No Abnormalities Noted No Abnormalities Noted Cyanosis: No Ecchymosis: No Erythema: No Hemosiderin Staining: No Mottled: No Pallor: No Rubor: No Temperature: No Abnormality No Abnormality N/A Vaca, Mackenzie E. ([id number]) Tenderness on No Yes No Palpation: Wound Preparation: Ulcer Cleansing: Ulcer Cleansing: N/A Rinsed/Irrigated with Rinsed/Irrigated with Saline Saline Topical Anesthetic Topical Anesthetic Applied: None Applied: None Treatment Notes Wound #1 (Right Toe Great) 1. Cleansed with: Clean wound with Normal Saline 4. Dressing Applied: Aquacel Ag 5. Secondary Dressing Applied ABD Pad Kerlix/Conform Notes Nystatin cream, Sivercell, ABD, conform and stretch net Wound #2 (Right Toe Second) 1. Cleansed with: Clean wound with Normal Saline 4. Dressing Applied: Aquacel Ag 5. Secondary Dressing Applied ABD Pad Kerlix/Conform Notes Nystatin cream, Sivercell, ABD, conform and  stretch net Wound #5 (Left Toe Great) 1. Cleansed with: Clean wound with Normal Saline 4. Dressing Applied: Aquacel Ag 5. Secondary Dressing Applied ABD Pad Kerlix/Conform Notes Nystatin cream, Sivercell, ABD, conform and stretch net Wound #6 (Left Toe Second) 1. Cleansed with: Clean wound with Normal Saline Rotter, [place] E. ([id number]) 4. Dressing Applied: Aquacel Ag 5. Secondary Dressing Applied ABD Pad Kerlix/Conform Notes Nystatin cream, Sivercell, ABD, conform and stretch net Wound #8 (Left Toe Third) 1. Cleansed with: Clean wound with Normal Saline 4. Dressing Applied: Aquacel Ag 5. Secondary Dressing Applied ABD Pad Kerlix/Conform Notes Nystatin cream, Sivercell, ABD, conform and stretch net Wound #9 (Right Toe Third) 1. Cleansed with: Clean wound with Normal Saline 4. Dressing Applied: Aquacel Ag 5. Secondary Dressing Applied ABD Pad Kerlix/Conform Notes Nystatin cream, Sivercell, ABD, conform and stretch net Electronic Signature(s) Signed: 01/14/2017 11:53:31 AM By: Christin Fudge MD, FACS Entered By: Christin Fudge on 01/14/2017 11:53:31 Hebel, [name] ([id number]) -------------------------------------------------------------------------------- [place] Details Patient Name: [name], [name] 01/14/2017 11:00 Date of Service: AM Medical Record [id number] Number: Patient Account Number: [id number] Date of Birth/Sex: Mar 13, 1951 (66 y.o. Male) Treating RN: Anastasio Champion, Other Clinician: Primary Care Caroljean Monsivais: Olivia Mackie Treating Britto, Cherie Ouch, Haydin Dunn/Extender: Referring Kaydince Towles: Harless Nakayama in Treatment: 16 Active Inactive ` Abuse / Safety / Falls / Self Care Management Nursing Diagnoses: Potential for falls Goals: Patient will remain injury free related to falls Date Initiated: 09/21/2016 Target Resolution Date: 11/30/2016 Goal Status: Active Interventions: Assess fall risk on admission and as  needed Notes: ` Nutrition Nursing Diagnoses: Potential  for alteratiion in Nutrition/Potential for imbalanced nutrition Goals: Patient/caregiver agrees to and verbalizes understanding of need to use nutritional supplements and/or vitamins as prescribed Date Initiated: 09/21/2016 Target Resolution Date: 11/30/2016 Goal Status: Active Interventions: Assess patient nutrition upon admission and as needed per policy Notes: ` Orientation to the Joyce, KOLBY SCHARA ([id number]) Nursing Diagnoses: Knowledge deficit related to the wound healing center program Goals: Patient/caregiver will verbalize understanding of the [place] Date Initiated: 09/21/2016 Target Resolution Date: 11/30/2016 Goal Status: Active Interventions: Provide education on orientation to the wound center Notes: ` Wound/Skin Impairment Nursing Diagnoses: Knowledge deficit related to smoking impact on wound healing Goals: Ulcer/skin breakdown will have a volume reduction of 30% by week 4 Date Initiated: 09/21/2016 Target Resolution Date: 11/30/2016 Goal Status: Active Ulcer/skin breakdown will have a volume reduction of 50% by week 8 Date Initiated: 09/21/2016 Target Resolution Date: 11/30/2016 Goal Status: Active Ulcer/skin breakdown will have a volume reduction of 80% by week 12 Date Initiated: 09/21/2016 Target Resolution Date: 11/30/2016 Goal Status: Active Ulcer/skin breakdown will heal within 14 weeks Date Initiated: 09/21/2016 Target Resolution Date: 11/30/2016 Goal Status: Active Interventions: Assess patient/caregiver ability to obtain necessary supplies Assess patient/caregiver ability to perform ulcer/skin care regimen upon admission and as needed Assess ulceration(s) every visit Notes: Electronic Signature(s) Signed: 01/14/2017 5:09:28 PM By: Alric Quan Entered By: Alric Quan on 01/14/2017 11:22:50 Epp, [name]  ([id number]) -------------------------------------------------------------------------------- Pain Assessment Details Patient Name: [name] Brittle E. 01/14/2017 11:00 Date of Service: AM Medical Record [id number] Number: Patient Account Number: [id number] Date of Birth/Sex: Aug 19, 1950 (66 y.o. Male) Treating RN: Elyn Peers, Other Clinician: Primary Care Kissie Ziolkowski: Olivia Mackie Treating Britto, Cherie Ouch, Aybree Lanyon/Extender: Referring Zackory Pudlo: Harless Nakayama in Treatment: 16 Active Problems Location of Pain Severity and Description of Pain Patient Has Paino No Site Locations Pain Management and Medication Current Pain Management: Notes Topical or injectable lidocaine is offered to patient for acute pain when surgical debridement is performed. If needed, Patient is instructed to use over the counter pain medication for the following 24-48 hours after debridement. Wound care MDs do not prescribed pain medications. Patient has chronic pain or uncontrolled pain. Patient has been instructed to make an appointment with their Primary Care Physician for pain management. Electronic Signature(s) Signed: 01/14/2017 4:58:11 PM By: Montey Hora Entered By: Montey Hora on 01/14/2017 11:12:42 Baldus, [name] ([id number]) -------------------------------------------------------------------------------- Patient/Caregiver Education Details Patient Name: [name], [name] 01/14/2017 11:00 Date of Service: AM Medical Record [id number] Number: Patient Account Number: [id number] Date of Birth/Gender: [birth year] (66 y.o. Male) Treating RN: Carolyne Fiscal, Debi Primary Care MCLEAN-SCOCOZZA, Other Clinician: Physician: Olivia Mackie Treating Britto, Cherie Ouch, Physician/Extender: Referring Physician: Harless Nakayama in Treatment: 16 Education Assessment Education Provided To: Patient Education Topics Provided Wound/Skin Impairment: Handouts: Other: change dressing as  ordered Methods: Demonstration, Explain/Verbal Responses: State content correctly Electronic Signature(s) Signed: 01/14/2017 5:09:28 PM By: Alric Quan Entered By: Alric Quan on 01/14/2017 11:23:37 Baumgarner, Yuvraj Johnette Abraham ([id number]) -------------------------------------------------------------------------------- Wound Assessment Details Patient Name: [name] Brittle E. 01/14/2017 11:00 Date of Service: AM Medical Record [id number] Number: Patient Account Number: [id number] Date of Birth/Sex: [birth year] (66 y.o. Male) Treating RN: Elyn Peers, Other Clinician: Primary Care Adaley Kiene: Olivia Mackie Treating Britto, Cherie Ouch, Farrel Guimond/Extender: Referring Vassie Kugel: Harless Nakayama in Treatment: 16 Wound Status Wound Number: 1 Primary Diabetic Wound/Ulcer of the Lower Etiology: Extremity Wound Location: Right Toe Great Wound Open Wounding Event: Gradually Appeared Status: Date Acquired: 06/11/2016 Comorbid Anemia, Lymphedema, Congestive Weeks Of Treatment: 16 History: Heart Failure, Hypertension, Peripheral Clustered  Wound: No Venous Disease, Type II Diabetes, Pending Amputation On Presentation Neuropathy Photos Photo Uploaded By: Montey Hora on 01/14/2017 16:54:25 Wound Measurements Length: (cm) 1 Width: (cm) 5.5 Depth: (cm) 0.2 Area: (cm) 4.32 Volume: (cm) 0.864 % Reduction in Area: 90.8% % Reduction in Volume: 81.7% Epithelialization: Large (67-100%) Tunneling: No Undermining: No Wound Description Classification: Grade 1 Foul Odor After Wound Margin: Flat and Intact Due to Product Exudate Amount: Large Slough/Fibrino Exudate Type: Serosanguineous Exudate Color: red, brown Cleansing: Yes Use: No Yes Wound Bed [place], Jshaun E. ([id number]) Granulation Amount: Medium (34-66%) Exposed Structure Granulation Quality: Pink Fascia Exposed: No Necrotic Amount: Medium (34-66%) Fat Layer (Subcutaneous Tissue) Exposed: Yes Necrotic Quality:  Adherent Slough Tendon Exposed: No Muscle Exposed: No Joint Exposed: No Bone Exposed: No Periwound Skin Texture Texture Color No Abnormalities Noted: No No Abnormalities Noted: No Callus: No Atrophie Blanche: No Crepitus: No Cyanosis: No Excoriation: Yes Ecchymosis: No Induration: No Erythema: No Rash: No Hemosiderin Staining: No Scarring: No Mottled: No Pallor: No Moisture Rubor: No No Abnormalities Noted: No Dry / Scaly: No Temperature / Pain Maceration: Yes Temperature: No Abnormality Wound Preparation Ulcer Cleansing: Rinsed/Irrigated with Saline Topical Anesthetic Applied: None Treatment Notes Wound #1 (Right Toe Great) 1. Cleansed with: Clean wound with Normal Saline 4. Dressing Applied: Aquacel Ag 5. Secondary Dressing Applied ABD Pad Kerlix/Conform Notes Nystatin cream, Sivercell, ABD, conform and stretch net Electronic Signature(s) Signed: 01/14/2017 4:58:11 PM By: Montey Hora Signed: 01/14/2017 5:09:28 PM By: Alric Quan Entered By: Alric Quan on 01/14/2017 11:21:02 [name], [name] Kitchen ([id number]) -------------------------------------------------------------------------------- Wound Assessment Details Patient Name: BUD, KAESER 01/14/2017 11:00 Date of Service: AM Medical Record [id number] Number: Patient Account Number: [id number] Date of Birth/Sex: Mar 08, 1951 (66 y.o. Male) Treating RN: Elyn Peers, Other Clinician: Primary Care Riggin Cuttino: Olivia Mackie Treating Britto, Cherie Ouch, Erin Obando/Extender: Referring Trudy Kory: Harless Nakayama in Treatment: 16 Wound Status Wound Number: 2 Primary Diabetic Wound/Ulcer of the Lower Etiology: Extremity Wound Location: Right Toe Second Wound Open Wounding Event: Gradually Appeared Status: Date Acquired: 06/11/2016 Comorbid Anemia, Lymphedema, Congestive Weeks Of Treatment: 16 History: Heart Failure, Hypertension, Peripheral Clustered Wound: No Venous Disease, Type II  Diabetes, Pending Amputation On Presentation Neuropathy Photos Photo Uploaded By: Montey Hora on 01/14/2017 16:54:26 Wound Measurements Length: (cm) 0.9 Width: (cm) 1.3 Depth: (cm) 0.1 Area: (cm) 0.919 Volume: (cm) 0.092 % Reduction in Area: 70.1% % Reduction in Volume: 70% Epithelialization: Small (1-33%) Tunneling: No Undermining: No Wound Description Classification: Grade 1 Foul Odor After Wound Margin: Flat and Intact Due to Product Exudate Amount: Large Slough/Fibrino Exudate Type: Serosanguineous Exudate Color: red, brown Cleansing: Yes Use: No Yes Wound Bed Gaudin, Quasean E. ([id number]) Granulation Amount: Large (67-100%) Exposed Structure Granulation Quality: Pink Fascia Exposed: No Necrotic Amount: None Present (0%) Fat Layer (Subcutaneous Tissue) Exposed: Yes Tendon Exposed: No Muscle Exposed: No Joint Exposed: No Bone Exposed: No Periwound Skin Texture Texture Color No Abnormalities Noted: No No Abnormalities Noted: No Callus: No Atrophie Blanche: No Crepitus: No Cyanosis: No Excoriation: Yes Ecchymosis: No Induration: No Erythema: No Rash: No Hemosiderin Staining: No Scarring: No Mottled: No Pallor: No Moisture Rubor: No No Abnormalities Noted: No Dry / Scaly: No Temperature / Pain Maceration: Yes Temperature: No Abnormality Wound Preparation Ulcer Cleansing: Rinsed/Irrigated with Saline Topical Anesthetic Applied: None Treatment Notes Wound #2 (Right Toe Second) 1. Cleansed with: Clean wound with Normal Saline 4. Dressing Applied: Aquacel Ag 5. Secondary Dressing Applied ABD Pad Kerlix/Conform Notes Nystatin cream, Sivercell, ABD, conform and stretch net Electronic Signature(s) Signed:  01/14/2017 4:58:11 PM By: Montey Hora Signed: 01/14/2017 5:09:28 PM By: Alric Quan Entered By: Alric Quan on 01/14/2017 11:21:16 Thissen, [name]  ([id number]) -------------------------------------------------------------------------------- Wound Assessment Details Patient Name: ROHAIL, KLEES 01/14/2017 11:00 Date of Service: AM Medical Record [id number] Number: Patient Account Number: [id number] Date of Birth/Sex: May 27, 1950 (66 y.o. Male) Treating RN: Elyn Peers, Other Clinician: Primary Care Denica Web: Olivia Mackie Treating Britto, Cherie Ouch, Jerra Huckeby/Extender: Referring Fancy Dunkley: Harless Nakayama in Treatment: 16 Wound Status Wound Number: 5 Primary Diabetic Wound/Ulcer of the Lower Etiology: Extremity Wound Location: Left Toe Great Wound Open Wounding Event: Gradually Appeared Status: Date Acquired: 06/11/2016 Comorbid Anemia, Lymphedema, Congestive Weeks Of Treatment: 16 History: Heart Failure, Hypertension, Peripheral Clustered Wound: No Venous Disease, Type II Diabetes, Pending Amputation On Presentation Neuropathy Photos Photo Uploaded By: Montey Hora on 01/14/2017 16:55:11 Wound Measurements Length: (cm) 0.6 Width: (cm) 0.5 Depth: (cm) 0.1 Area: (cm) 0.236 Volume: (cm) 0.024 % Reduction in Area: 98.3% % Reduction in Volume: 98.3% Epithelialization: Small (1-33%) Tunneling: No Undermining: No Wound Description Classification: Grade 1 Foul Odor After Wound Margin: Flat and Intact Due to Product Exudate Amount: Large Slough/Fibrino Exudate Type: Serous Exudate Color: amber Cleansing: Yes Use: No Yes Wound Bed Wanek, Tonya E. ([id number]) Granulation Amount: Large (67-100%) Exposed Structure Granulation Quality: Pink Fascia Exposed: No Necrotic Amount: Small (1-33%) Fat Layer (Subcutaneous Tissue) Exposed: Yes Necrotic Quality: Adherent Slough Tendon Exposed: No Muscle Exposed: No Joint Exposed: No Bone Exposed: No Periwound Skin Texture Texture Color No Abnormalities Noted: No No Abnormalities Noted: No Callus: No Atrophie Blanche: No Crepitus: No Cyanosis:  No Excoriation: Yes Ecchymosis: No Induration: No Erythema: No Rash: No Hemosiderin Staining: No Scarring: No Mottled: No Pallor: No Moisture Rubor: No No Abnormalities Noted: No Dry / Scaly: No Temperature / Pain Maceration: No Temperature: No Abnormality Wound Preparation Ulcer Cleansing: Rinsed/Irrigated with Saline Topical Anesthetic Applied: None Treatment Notes Wound #5 (Left Toe Great) 1. Cleansed with: Clean wound with Normal Saline 4. Dressing Applied: Aquacel Ag 5. Secondary Dressing Applied ABD Pad Kerlix/Conform Notes Nystatin cream, Sivercell, ABD, conform and stretch net Electronic Signature(s) Signed: 01/14/2017 4:58:11 PM By: Montey Hora Signed: 01/14/2017 5:09:28 PM By: Alric Quan Entered By: Alric Quan on 01/14/2017 11:21:32 Passow, Medard [name] Kitchen ([id number]) -------------------------------------------------------------------------------- Wound Assessment Details Patient Name: ANDRES, BANTZ 01/14/2017 11:00 Date of Service: AM Medical Record [id number] Number: Patient Account Number: [id number] Date of Birth/Sex: 27-Nov-1950 (66 y.o. Male) Treating RN: Elyn Peers, Other Clinician: Primary Care Quetzally Callas: Olivia Mackie Treating Britto, Cherie Ouch, Trayshawn Durkin/Extender: Referring Laurisa Sahakian: Harless Nakayama in Treatment: 16 Wound Status Wound Number: 6 Primary Diabetic Wound/Ulcer of the Lower Etiology: Extremity Wound Location: Left Toe Second Wound Open Wounding Event: Gradually Appeared Status: Date Acquired: 06/11/2016 Comorbid Anemia, Lymphedema, Congestive Weeks Of Treatment: 16 History: Heart Failure, Hypertension, Peripheral Clustered Wound: No Venous Disease, Type II Diabetes, Pending Amputation On Presentation Neuropathy Photos Photo Uploaded By: Montey Hora on 01/14/2017 16:55:11 Wound Measurements Length: (cm) 1.5 Width: (cm) 1.5 Depth: (cm) 0.1 Area: (cm) 1.767 Volume: (cm) 0.177 % Reduction  in Area: 17.3% % Reduction in Volume: 17.3% Epithelialization: Small (1-33%) Tunneling: No Undermining: No Wound Description Classification: Grade 1 Foul Odor After Wound Margin: Flat and Intact Due to Product Exudate Amount: Large Slough/Fibrino Exudate Type: Serous Exudate Color: amber Cleansing: Yes Use: No Yes Wound Bed Berlinger, Januel E. ([id number]) Granulation Amount: Medium (34-66%) Exposed Structure Granulation Quality: Pink Fascia Exposed: No Necrotic Amount: Medium (34-66%) Fat Layer (Subcutaneous Tissue) Exposed: Yes Necrotic Quality: Adherent Slough Tendon  Exposed: No Muscle Exposed: No Joint Exposed: No Bone Exposed: No Periwound Skin Texture Texture Color No Abnormalities Noted: No No Abnormalities Noted: No Callus: No Atrophie Blanche: No Crepitus: No Cyanosis: No Excoriation: Yes Ecchymosis: No Induration: No Erythema: No Rash: No Hemosiderin Staining: No Scarring: No Mottled: No Pallor: No Moisture Rubor: No No Abnormalities Noted: No Dry / Scaly: No Temperature / Pain Maceration: Yes Temperature: No Abnormality Wound Preparation Ulcer Cleansing: Rinsed/Irrigated with Saline Topical Anesthetic Applied: None Treatment Notes Wound #6 (Left Toe Second) 1. Cleansed with: Clean wound with Normal Saline 4. Dressing Applied: Aquacel Ag 5. Secondary Dressing Applied ABD Pad Kerlix/Conform Notes Nystatin cream, Sivercell, ABD, conform and stretch net Electronic Signature(s) Signed: 01/14/2017 4:58:11 PM By: Montey Hora Signed: 01/14/2017 5:09:28 PM By: Alric Quan Entered By: Alric Quan on 01/14/2017 11:21:56 Escoto, [name] ([id number]) -------------------------------------------------------------------------------- Wound Assessment Details Patient Name: SEVERINO, PAOLO 01/14/2017 11:00 Date of Service: AM Medical Record [id number] Number: Patient Account Number: [id number] Date of Birth/Sex: [birth year] (66 y.o. Male) Treating  RN: Elyn Peers, Other Clinician: Primary Care Shamona Wirtz: Olivia Mackie Treating Britto, Cherie Ouch, Edrei Norgaard/Extender: Referring Daeja Helderman: Harless Nakayama in Treatment: 16 Wound Status Wound Number: 8 Primary Diabetic Wound/Ulcer of the Lower Etiology: Extremity Wound Location: Left Toe Third Wound Open Wounding Event: Gradually Appeared Status: Date Acquired: 12/30/2016 Comorbid Anemia, Lymphedema, Congestive Weeks Of Treatment: 2 History: Heart Failure, Hypertension, Peripheral Clustered Wound: No Venous Disease, Type II Diabetes, Neuropathy Photos Photo Uploaded By: Montey Hora on 01/14/2017 16:55:58 Wound Measurements Length: (cm) 0.5 Width: (cm) 0.6 Depth: (cm) 0.1 Area: (cm) 0.236 Volume: (cm) 0.024 % Reduction in Area: 70.5% % Reduction in Volume: 70% Epithelialization: None Tunneling: No Undermining: No Wound Description Classification: Grade 1 Foul Odor After Wound Margin: Distinct, outline attached Slough/Fibrino Exudate Amount: Large Exudate Type: Serosanguineous Exudate Color: red, brown Cleansing: No Yes Wound Bed Tokarczyk, Trew E. ([id number]) Granulation Amount: Large (67-100%) Granulation Quality: Red Necrotic Amount: None Present (0%) Periwound Skin Texture Texture Color No Abnormalities Noted: No No Abnormalities Noted: No Moisture Temperature / Pain No Abnormalities Noted: No Temperature: No Abnormality Maceration: Yes Tenderness on Palpation: Yes Wound Preparation Ulcer Cleansing: Rinsed/Irrigated with Saline Topical Anesthetic Applied: None Treatment Notes Wound #8 (Left Toe Third) 1. Cleansed with: Clean wound with Normal Saline 4. Dressing Applied: Aquacel Ag 5. Secondary Dressing Applied ABD Pad Kerlix/Conform Notes Nystatin cream, Sivercell, ABD, conform and stretch net Electronic Signature(s) Signed: 01/14/2017 4:58:11 PM By: Montey Hora Signed: 01/14/2017 5:09:28 PM By: Alric Quan Entered  By: Alric Quan on 01/14/2017 11:22:37 Oren, Skylur Johnette Abraham ([id number]) -------------------------------------------------------------------------------- Wound Assessment Details Patient Name: JOSEFF, LUCKMAN 01/14/2017 11:00 Date of Service: AM Medical Record [id number] Number: Patient Account Number: [id number] Date of Birth/Sex: [birth year] (66 y.o. Male) Treating RN: Elyn Peers, Other Clinician: Primary Care Jasmane Brockway: Olivia Mackie Treating Britto, Cherie Ouch, Srihith Aquilino/Extender: Referring Elizzie Westergard: Harless Nakayama in Treatment: 16 Wound Status Wound Number: 9 Primary Diabetic Wound/Ulcer of the Lower Etiology: Extremity Wound Location: Right Toe Third Wound Status: Open Wounding Event: Gradually Appeared Date Acquired: 12/31/2016 Weeks Of Treatment: 2 Clustered Wound: No Photos Photo Uploaded By: Montey Hora on 01/14/2017 16:55:59 Wound Measurements Length: (cm) 0.4 Width: (cm) 0.5 Depth: (cm) 0.1 Area: (cm) 0.157 Volume: (cm) 0.016 % Reduction in Area: 60.1% % Reduction in Volume: 59% Wound Description Classification: Grade 1 Periwound Skin Texture Texture Color No Abnormalities Noted: No No Abnormalities Noted: No Moisture No Abnormalities Noted: No Barnhill, Adith E. ([id number]) Treatment Notes Wound #9 (Right Toe Third) 1.  Cleansed with: Clean wound with Normal Saline 4. Dressing Applied: Aquacel Ag 5. Secondary Dressing Applied ABD Pad Kerlix/Conform Notes Nystatin cream, Sivercell, ABD, conform and stretch net Electronic Signature(s) Signed: 01/14/2017 4:58:11 PM By: Montey Hora Entered By: Montey Hora on 01/14/2017 11:19:32 Kosik, [name] ([id number]) -------------------------------------------------------------------------------- Vitals Details Patient Name: [name] Brittle E. 01/14/2017 11:00 Date of Service: AM Medical Record [id number] Number: Patient Account Number: [id number] Date of Birth/Sex: June 02, 1950 (66 y.o.  Male) Treating RN: Elyn Peers, Other Clinician: Primary Care Arda Keadle: Olivia Mackie Treating Britto, Cherie Ouch, Jamielynn Wigley/Extender: Referring Tarl Cephas: Harless Nakayama in Treatment: 16 Vital Signs Time Taken: 11:13 Temperature (F): 98.2 Height (in): 69 Pulse (bpm): 67 Weight (lbs): 168 Respiratory Rate (breaths/min): 18 Body Mass Index (BMI): 24.8 Blood Pressure (mmHg): 145/72 Reference Range: 80 - 120 mg / dl Electronic Signature(s) Signed: 01/14/2017 4:58:11 PM By: Montey Hora Entered By: Montey Hora on 01/14/2017 11:15:30

## 2017-01-16 NOTE — Progress Notes (Signed)
[name], [name] ([id number]) Visit Report for 01/14/2017 Chief Complaint Document Details Patient Name: [name], [name] 01/14/2017 11:00 Date of Service: AM Medical Record [id number] Number: Patient Account Number: [id number] Date of Birth/Sex: 1950/04/13 (66 y.o. Male) Treating RN: [name], Other Clinician: Primary Care Provider: Olivia [name] Treating [name], [name], Provider/Extender: Referring Provider: Harless [name] in Treatment: 16 Information Obtained from: Patient Chief Complaint Patients presents for treatment of an open diabetic ulcer to both feet Electronic Signature(s) Signed: 01/14/2017 11:53:41 AM By: [name] Fudge MD, FACS Entered By: [name] on 01/14/2017 11:53:40 [name], [name] ([id number]) -------------------------------------------------------------------------------- HPI Details Patient Name: [name], [name] 01/14/2017 11:00 Date of Service: AM Medical Record [id number] Number: Patient Account Number: [id number] Date of Birth/Sex: 1951-01-18 (66 y.o. Male) Treating RN: [name], Other Clinician: Primary Care Provider: Olivia [name] Treating [name], [name], Provider/Extender: Referring Provider: Harless [name] in Treatment: 16 History of Present Illness Location: bilateral feet ulceration on the toes Quality: Patient reports experiencing a dull pain to affected area(s). Severity: Patient states wound are getting better Duration: Patient has had the wound for > 3 months prior to seeking treatment at the wound center Timing: Pain in wound is constant (hurts all the time) Context: The wound would happen gradually Modifying Factors: Other treatment(s) tried include:treatment for lymphedema and is seen by the podiatrist Dr. Caryl [name] Associated Signs and Symptoms: Patient reports having increase swelling. HPI Description: 66 year old patient here to see as for bilateral feet ulceration to on his left  first and second toe and 2 on his right first and second toe, which she's had for about 4 months. 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 Donella [name]. The patient is also undergoing workup by medical oncology for a intra-abdominal lymphadenopathy. 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 He is also status post appendectomy, inguinal lymph node biopsy, rectal biopsy and rectal examination under anesthesia. he currently smokes cigarettes about half packet a day. In March of this year he was seen by [name], for evaluation of bilateral varicose veins and besides wearing compression stockings he had recommended laser ablation of the right and left great saphenous veins to eleviate the symptoms and complications of severe superficial venous reflux disease. He also recommended lymphedema pumps for better control of his lymphedema. The patient recently has had on 08/23/2016, right greater saphenous vein ablation with the laser energy Earlier lower extremity venous reflux examination done on 05/08/2016 showed no DVT or SVT both lower legs but incompetence of bilateral great saphenous veins was present. A lower arterial study was also done and there was no significant right lower and left lower extremity problems based on a normal toe brachial index bilaterally and the ABI was 1.21 the left and 1.23 on the right. His post ablation venous duplex examination showed successful ablation of the right GS vein with thrombus formation 2 below the right saphenofemoral junction. The deep system was patent without evidence of thrombosis and this was done on 08/30/2016. the patient also has a squamous cell cancer of the skin of the buttock and is recently  undergone radiation therapy for this prior to excisional surgery. Addendum: regarding his x-rays done today and x-ray of the left foot -- IMPRESSION: No objective [name], [name] ([id number]) evidence of osteomyelitis.  There are soft tissue changes which may reflect cellulitis. X-ray of the right foot -- IMPRESSION:Findings compatible with cellulitis of the toes. No objective evidence of osteomyelitis is observed. 10/01/16 on evaluation today patient's wounds appeared to be doing some better. I did review the x-rays as well which showed no evidence of osteomyelitis although there was evidence on x-ray of cellulitis. He fortunately is not having any discomfort although he continues to have some swelling. He does not remember being on any antibiotics recently. 10/15/16 on evaluation today patient's wounds overall appear to be doing better although he does have a new location noted on the left foot. Fortunately he is not having significant pain. It almost has the appearance that something is rubbing on the end of his toes but he wears the open toe shoes and according to what he is telling me never wears anything that would rub on his foot. There is no evidence of infection and specifically no evidence of a fungal infection 10/22/16 On evaluation today patient's wounds appeared to be doing better compared to last week in regard to his bilateral lower extremities. Fortunately I happy with how things are progressing although he still has ulcers I feel like that he is improving and appropriate manner. 11/12/16 on evaluation today patient appears to be doing well in regard to his bilateral feet and the respective wounds. We have been using surrounding her dressings along with an antifungal cream which seems to be doing very well. He has no bilateral dysfunction noticed that the rituals are weight loss at this point. He also has no nausea or vomiting a note purulent discharge. He did see Vein and vascular  today and he tells me that they told him he could have surgery for his venous stasis but they did not feel like it was worth it in his words. Fortunately patient's wounds do appear to be getting sneakily better. 11/26/2016 -- he says he is going to have some surgery during this week at [facility] for possibly a colon resection. 12/31/2016 -- the patient has been noncompliant with his smoking and I'm not sure whether he is also started drinking again. He continues to be very nonchalant about his care 01/14/2017 -- the patient's HandP has been reviewed well and I understand he is being compliant with trying to give up smoking and his local dressing changes. He does not have any surgical options of 4 to him by his vascular surgeons.he was last seen in early August by [name] who recommended compression stockings,and possibly lymph pumps in 2-3 months after doing a review ultrasound. Electronic Signature(s) Signed: 01/14/2017 11:59:10 AM By: [name] Fudge MD, FACS Entered By: [name] on 01/14/2017 11:59:10 [name], [name] ([id number]) -------------------------------------------------------------------------------- Physical Exam Details Patient Name: BEAR, OSTEN 01/14/2017 11:00 Date of Service: AM Medical Record [id number] Number: Patient Account Number: [id number] Date of Birth/Sex: 05-24-1950 (66 y.o. Male) Treating RN: [name], Other Clinician: Primary Care Provider: Olivia [name] Treating Petar Mucci, [name], Provider/Extender: Referring Provider: Harless [name] in Treatment: 16 Constitutional . Pulse regular. Respirations normal and unlabored. Afebrile. . Eyes Nonicteric. Reactive to light. Ears, Nose, Mouth, and Throat Lips, teeth, and gums WNL.Marland Kitchen Moist mucosa without lesions. Neck supple and nontender. No palpable supraclavicular or cervical adenopathy. Normal sized without goiter. Respiratory WNL. No  retractions.. Cardiovascular Pedal Pulses WNL. No clubbing, cyanosis or edema. Lymphatic No adneopathy. No adenopathy. No adenopathy. Musculoskeletal Adexa without tenderness or enlargement.. Digits and nails w/o clubbing, cyanosis, infection, petechiae, ischemia, or  inflammatory conditions.. Integumentary (Hair, Skin) No suspicious lesions. No crepitus or fluctuance. No peri-wound warmth or erythema. No masses.Marland Kitchen Psychiatric Judgement and insight Intact.. No evidence of depression, anxiety, or agitation.. Notes the right leg is looking much better than the left and the few areas on the left which are open fairly superficial and no sharp debridement was required today. I also cautioned him to use the antifungal ointment in his web spaces between his toes. Electronic Signature(s) Signed: 01/14/2017 11:59:54 AM By: [name] Fudge MD, FACS Entered By: [name] on 01/14/2017 11:59:54 [name], [name] ([id number]) -------------------------------------------------------------------------------- Physician Orders Details Patient Name: [name], [name] 01/14/2017 11:00 Date of Service: AM Medical Record [id number] Number: Patient Account Number: [id number] Date of Birth/Sex: Dec 25, 1950 (66 y.o. Male) Treating RN: Josefa Half, Other Clinician: Primary Care Provider: Olivia [name] Treating Bravery Ketcham, [name], Provider/Extender: Referring Provider: Harless [name] in Treatment: 71 Verbal / Phone Orders: No Diagnosis Coding Wound Cleansing Wound #1 Right Toe Great o Cleanse wound with mild soap and water o May Shower, gently pat wound dry prior to applying new dressing. Wound #2 Right Toe Second o Cleanse wound with mild soap and water o May Shower, gently pat wound dry prior to applying new dressing. Wound #5 Left Toe Great o Cleanse wound with mild soap and water o May Shower, gently pat wound dry prior to applying new dressing. Wound #6 Left Toe  Second o Cleanse wound with mild soap and water o May Shower, gently pat wound dry prior to applying new dressing. Wound #8 Left Toe Third o Cleanse wound with mild soap and water o May Shower, gently pat wound dry prior to applying new dressing. Wound #9 Right Toe Third o Cleanse wound with mild soap and water o May Shower, gently pat wound dry prior to applying new dressing. Skin Barriers/Peri-Wound Care Wound #1 Right Toe Great o Antifungal cream - Nystatin on and between toes then AqAg over areas Wound #2 Right Toe Second o Antifungal cream - Nystatin on and between toes then AqAg over areas Wound #5 Left Toe Great o Antifungal cream - Nystatin on and between toes then AqAg over areas [name], [name] ([id number]) Wound #6 Left Toe Second o Antifungal cream - Nystatin on and between toes then AqAg over areas Wound #8 Left Toe Third o Antifungal cream - Nystatin on and between toes then AqAg over areas Wound #9 Right Toe Third o Antifungal cream - Nystatin on and between toes then AqAg over areas Primary Wound Dressing Wound #1 Right Toe Great o Aquacel Ag Wound #2 Right Toe Second o Aquacel Ag Wound #5 Left Toe Great o Aquacel Ag Wound #6 Left Toe Second o Aquacel Ag Wound #8 Left Toe Third o Aquacel Ag Wound #9 Right Toe Third o Aquacel Ag Secondary Dressing Wound #1 Right Toe Great o ABD pad o Gauze and Kerlix/Conform Wound #2 Right Toe Second o ABD pad o Gauze and Kerlix/Conform Wound #5 Left Toe Great o ABD pad o Gauze and Kerlix/Conform Wound #6 Left Toe Second o ABD pad o Gauze and Kerlix/Conform Wound #8 Left Toe Third o ABD pad o Gauze and Kerlix/Conform [name], [name] ([id number]) Wound #9 Right Toe Third o ABD pad o Gauze and Kerlix/Conform Dressing Change Frequency Wound #1 Right Toe Great o Change dressing every day. Wound #2 Right Toe Second o Change dressing every day. Wound #5  Left Toe Great o Change dressing every day. Wound #6 Left Toe Second o Change dressing every day.  Wound #8 Left Toe Third o Change dressing every day. Wound #9 Right Toe Third o Change dressing every day. Follow-up Appointments Wound #1 Right Toe Great o Return Appointment in 1 week. Wound #2 Right Toe Second o Return Appointment in 1 week. Wound #5 Left Toe Great o Return Appointment in 1 week. Wound #6 Left Toe Second o Return Appointment in 1 week. Wound #8 Left Toe Third o Return Appointment in 1 week. Wound #9 Right Toe Third o Return Appointment in 1 week. Additional Orders / Instructions Wound #1 Right Toe Great o Stop Smoking - Please continue to stay away from alcohol o Other: - Please add vitamin A, vitamin C, and Zinc supplements to your diet [name], [name] ([id number]) Wound #2 Right Toe Second o Stop Smoking - Please continue to stay away from alcohol o Other: - Please add vitamin A, vitamin C, and Zinc supplements to your diet Wound #5 Left Toe Great o Stop Smoking - Please continue to stay away from alcohol o Other: - Please add vitamin A, vitamin C, and Zinc supplements to your diet Wound #6 Left Toe Second o Stop Smoking - Please continue to stay away from alcohol o Other: - Please add vitamin A, vitamin C, and Zinc supplements to your diet Wound #8 Left Toe Third o Stop Smoking - Please continue to stay away from alcohol o Other: - Please add vitamin A, vitamin C, and Zinc supplements to your diet Wound #9 Right Toe Third o Stop Smoking - Please continue to stay away from alcohol o Other: - Please add vitamin A, vitamin C, and Zinc supplements to your diet Electronic Signature(s) Signed: 01/14/2017 4:27:05 PM By: [name] Fudge MD, FACS Signed: 01/15/2017 5:44:12 PM By: Gretta Cool, BSN, RN, CWS, Kim RN, BSN Entered By: Gretta Cool, BSN, RN, CWS, Kim on 01/14/2017 11:40:44 Goupil, [name]  ([id number]) -------------------------------------------------------------------------------- Problem List Details Patient Name: DEVON, PRETTY 01/14/2017 11:00 Date of Service: AM Medical Record [id number] Number: Patient Account Number: [id number] Date of Birth/Sex: October 31, 1950 (66 y.o. Male) Treating RN: [name], Other Clinician: Primary Care Provider: Olivia [name] Treating Loza Prell, [name], Provider/Extender: Referring Provider: Harless [name] in Treatment: 16 Active Problems ICD-10 Encounter Code Description Active Date Diagnosis E11.621 Type 2 diabetes mellitus with foot ulcer 09/21/2016 Yes I87.313 Chronic venous hypertension (idiopathic) with ulcer of 09/21/2016 Yes bilateral lower extremity I89.0 Lymphedema, not elsewhere classified 09/21/2016 Yes L97.522 Non-pressure chronic ulcer of other part of left foot with fat 09/21/2016 Yes layer exposed L97.512 Non-pressure chronic ulcer of other part of right foot with 09/21/2016 Yes fat layer exposed F17.218 Nicotine dependence, cigarettes, 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 01/14/2017 11:00 Date of Service: AM Medical Record [id number] Number: Patient Account Number: [id number] Date of Birth/Sex: Jul 08, 1950 (66 y.o. Male) Treating RN: [name], Other Clinician: Primary Care Provider: Olivia [name] Treating Franchot Pollitt, [name], Provider/Extender: Referring Provider: Harless [name] in Treatment: 16 Subjective Chief  Complaint Information obtained from Patient Patients presents for treatment of an open diabetic ulcer to both feet History of Present Illness (HPI) The following HPI elements were documented for the patient's wound: Location: bilateral feet ulceration on the toes Quality: Patient reports experiencing a dull pain to affected area(s). Severity:  Patient states wound are getting better Duration: Patient has had the wound for > 3 months prior to seeking treatment at the wound center Timing: Pain in wound is constant (hurts all the time) Context: The wound would happen gradually Modifying Factors: Other treatment(s) tried include:treatment for lymphedema and is seen by the podiatrist Dr. Caryl [name] Associated Signs and Symptoms: Patient reports having increase swelling. 66 year old patient here to see as for bilateral feet ulceration to on his left first and second toe and 2 on his right first and second toe, which she's had for about 4 months. 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 Donella [name]. The patient is also undergoing workup by medical oncology for a intra- abdominal lymphadenopathy. 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 He is also status post appendectomy, inguinal lymph node biopsy, rectal biopsy and rectal examination under anesthesia. he currently smokes cigarettes about half packet a day. In March of this year he was seen by [name], for evaluation of bilateral varicose veins and besides wearing compression stockings he had recommended laser ablation of the right and left great saphenous veins to eleviate the symptoms and complications of severe superficial venous reflux disease. He also recommended lymphedema pumps for better  control of his lymphedema. The patient recently has had on 08/23/2016, right greater saphenous vein ablation with the laser energy Earlier lower extremity venous reflux examination done on 05/08/2016 showed no DVT or SVT both lower legs but incompetence of bilateral great saphenous veins was present. A lower arterial study was also done and there was no significant right lower and left lower extremity problems based on a normal toe brachial index bilaterally and the ABI was 1.21 the left and 1.23 on the [name], Elishua E. ([id number]) right. His post ablation venous duplex examination showed successful ablation of the right GS vein with thrombus formation 2 below the right saphenofemoral junction. The deep system was patent without evidence of thrombosis and this was done on 08/30/2016. 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 Addendum: regarding his x-rays done today and x-ray of the left foot -- IMPRESSION: No objective evidence of osteomyelitis. There are soft tissue changes which may reflect cellulitis. X-ray of the right foot -- IMPRESSION:Findings compatible with cellulitis of the toes. No objective evidence of osteomyelitis is observed. 10/01/16 on evaluation today patient's wounds appeared to be doing some better. I did review the x-rays as well which showed no evidence of osteomyelitis although there was evidence on x-ray of cellulitis. He fortunately is not having any discomfort although he continues to have some swelling. He does not remember being on any antibiotics recently. 10/15/16 on evaluation today patient's wounds overall appear to be doing better although he does have a new location noted on the left foot. Fortunately he is not having significant pain. It almost has the appearance that something is rubbing on the end of his toes but he wears the open toe shoes and  according to what he is telling me never wears anything that would rub on his foot. There is no evidence of infection and specifically no evidence of a fungal infection 10/22/16 On evaluation today patient's wounds appeared  to be doing better compared to last week in regard to his bilateral lower extremities. Fortunately I happy with how things are progressing although he still has ulcers I feel like that he is improving and appropriate manner. 11/12/16 on evaluation today patient appears to be doing well in regard to his bilateral feet and the respective wounds. We have been using surrounding her dressings along with an antifungal cream which seems to be doing very well. He has no bilateral dysfunction noticed that the rituals are weight loss at this point. He also has no nausea or vomiting a note purulent discharge. He did see Vein and vascular today and he tells me that they told him he could have surgery for his venous stasis but they did not feel like it was worth it in his words. Fortunately patient's wounds do appear to be getting sneakily better. 11/26/2016 -- he says he is going to have some surgery during this week at [facility] for possibly a colon resection. 12/31/2016 -- the patient has been noncompliant with his smoking and I'm not sure whether he is also started drinking again. He continues to be very nonchalant about his care 01/14/2017 -- the patient's HandP has been reviewed well and I understand he is being compliant with trying to give up smoking and his local dressing changes. He does not have any surgical options of 4 to him by his vascular surgeons.he was last seen in early August by [name] who recommended compression stockings,and possibly lymph pumps in 2-3 months after doing a review ultrasound. Platte, Johnney E. ([id number]) Objective Constitutional Pulse regular. Respirations normal and unlabored. Afebrile. Vitals Time Taken: 11:13 AM, Height: 69 in,  Weight: 168 lbs, BMI: 24.8, Temperature: 98.2 F, Pulse: 67 bpm, Respiratory Rate: 18 breaths/min, Blood Pressure: 145/72 mmHg. Eyes Nonicteric. Reactive to light. Ears, Nose, Mouth, and Throat Lips, teeth, and gums WNL.Marland Kitchen Moist mucosa without lesions. Neck supple and nontender. No palpable supraclavicular or cervical adenopathy. Normal sized without goiter. Respiratory WNL. No retractions.. Cardiovascular Pedal Pulses WNL. No clubbing, cyanosis or edema. Lymphatic No adneopathy. No adenopathy. No adenopathy. Musculoskeletal Adexa without tenderness or enlargement.. Digits and nails w/o clubbing, cyanosis, infection, petechiae, ischemia, or inflammatory conditions.Marland Kitchen Psychiatric Judgement and insight Intact.. No evidence of depression, anxiety, or agitation.. General Notes: the right leg is looking much better than the left and the few areas on the left which are open fairly superficial and no sharp debridement was required today. I also cautioned him to use the antifungal ointment in his web spaces between his toes. Integumentary (Hair, Skin) No suspicious lesions. No crepitus or fluctuance. No peri-wound warmth or erythema. No masses.. Wound #1 status is Open. Original cause of wound was Gradually Appeared. The wound is located on the Right Toe Great. The wound measures 1cm length x 5.5cm width x 0.2cm depth; 4.32cm^2 area and 0.864cm^3 volume. There is Fat Layer (Subcutaneous Tissue) Exposed exposed. There is no tunneling or undermining noted. There is a large amount of serosanguineous drainage noted. Foul odor after cleansing was noted. The wound margin is flat and intact. There is medium (34-66%) pink granulation within the wound bed. There is a medium (34-66%) amount of necrotic tissue within the wound bed including Adherent Slough. The periwound skin appearance exhibited: Excoriation, Maceration. The periwound skin Dinh, Diamante E. ([id number]) appearance did not exhibit: Callus,  Crepitus, Induration, Rash, Scarring, Dry/Scaly, Atrophie Blanche, Cyanosis, Ecchymosis, Hemosiderin Staining, Mottled, Pallor, Rubor, Erythema. Periwound temperature was noted as No Abnormality. Wound #2  status is Open. Original cause of wound was Gradually Appeared. The wound is located on the Right Toe Second. The wound measures 0.9cm length x 1.3cm width x 0.1cm depth; 0.919cm^2 area and 0.092cm^3 volume. There is Fat Layer (Subcutaneous Tissue) Exposed exposed. There is no tunneling or undermining noted. There is a large amount of serosanguineous drainage noted. Foul odor after cleansing was noted. The wound margin is flat and intact. There is large (67-100%) pink granulation within the wound bed. There is no necrotic tissue within the wound bed. The periwound skin appearance exhibited: Excoriation, Maceration. The periwound skin appearance did not exhibit: Callus, Crepitus, Induration, Rash, Scarring, Dry/Scaly, Atrophie Blanche, Cyanosis, Ecchymosis, Hemosiderin Staining, Mottled, Pallor, Rubor, Erythema. Periwound temperature was noted as No Abnormality. Wound #5 status is Open. Original cause of wound was Gradually Appeared. The wound is located on the Left Toe Great. The wound measures 0.6cm length x 0.5cm width x 0.1cm depth; 0.236cm^2 area and 0.024cm^3 volume. There is Fat Layer (Subcutaneous Tissue) Exposed exposed. There is no tunneling or undermining noted. There is a large amount of serous drainage noted. Foul odor after cleansing was noted. The wound margin is flat and intact. There is large (67-100%) pink granulation within the wound bed. There is a small (1-33%) amount of necrotic tissue within the wound bed including Adherent Slough. The periwound skin appearance exhibited: Excoriation. The periwound skin appearance did not exhibit: Callus, Crepitus, Induration, Rash, Scarring, Dry/Scaly, Maceration, Atrophie Blanche, Cyanosis, Ecchymosis, Hemosiderin Staining, Mottled,  Pallor, Rubor, Erythema. Periwound temperature was noted as No Abnormality. Wound #6 status is Open. Original cause of wound was Gradually Appeared. The wound is located on the Left Toe Second. The wound measures 1.5cm length x 1.5cm width x 0.1cm depth; 1.767cm^2 area and 0.177cm^3 volume. There is Fat Layer (Subcutaneous Tissue) Exposed exposed. There is no tunneling or undermining noted. There is a large amount of serous drainage noted. Foul odor after cleansing was noted. The wound margin is flat and intact. There is medium (34-66%) pink granulation within the wound bed. There is a medium (34-66%) amount of necrotic tissue within the wound bed including Adherent Slough. The periwound skin appearance exhibited: Excoriation, Maceration. The periwound skin appearance did not exhibit: Callus, Crepitus, Induration, Rash, Scarring, Dry/Scaly, Atrophie Blanche, Cyanosis, Ecchymosis, Hemosiderin Staining, Mottled, Pallor, Rubor, Erythema. Periwound temperature was noted as No Abnormality. Wound #8 status is Open. Original cause of wound was Gradually Appeared. The wound is located on the Left Toe Third. The wound measures 0.5cm length x 0.6cm width x 0.1cm depth; 0.236cm^2 area and 0.024cm^3 volume. There is no tunneling or undermining noted. There is a large amount of serosanguineous drainage noted. The wound margin is distinct with the outline attached to the wound base. There is large (67-100%) red granulation within the wound bed. There is no necrotic tissue within the wound bed. The periwound skin appearance exhibited: Maceration. Periwound temperature was noted as No Abnormality. The periwound has tenderness on palpation. Wound #9 status is Open. Original cause of wound was Gradually Appeared. The wound is located on the Right Toe Third. The wound measures 0.4cm length x 0.5cm width x 0.1cm depth; 0.157cm^2 area and 0.016cm^3 volume. 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 Wound #2 Right Toe Second: Cleanse wound with mild soap and water May Shower, gently pat wound dry prior to applying new dressing. Wound #5 Left Toe Great: Cleanse wound with mild soap and water May Shower, gently pat wound dry prior to applying new dressing. Wound #6 Left Toe Second: Cleanse wound with mild soap and water May Shower, gently pat wound dry prior to applying new dressing. Wound #8 Left Toe Third: Cleanse wound with mild soap and water May Shower, gently pat wound dry prior to applying new dressing. Wound #9 Right Toe Third: Cleanse wound with mild soap and water May Shower, gently pat wound dry prior to applying new dressing. Skin Barriers/Peri-Wound Care: Wound #1 Right Toe Great: Antifungal cream - Nystatin on and between toes then AqAg over areas Wound #2 Right Toe Second: Antifungal cream - Nystatin on and between toes then AqAg over areas Wound #5 Left Toe Great: Antifungal cream - Nystatin on and between toes then AqAg over areas Wound #6 Left Toe Second: Antifungal cream - Nystatin on and between toes then AqAg over areas Wound #8 Left Toe Third: Antifungal cream - Nystatin on and between toes then AqAg over areas Salvo, Tyrie E. ([id number]) Wound #9 Right Toe Third: Antifungal cream -  Nystatin on and between toes then AqAg over areas Primary Wound Dressing: Wound #1 Right Toe Great: Aquacel Ag Wound #2 Right Toe Second: Aquacel Ag Wound #5 Left Toe Great: Aquacel Ag Wound #6 Left Toe Second: Aquacel Ag Wound #8 Left Toe Third: Aquacel Ag Wound #9 Right Toe Third: Aquacel Ag Secondary Dressing: Wound #1 Right Toe Great: ABD pad Gauze and Kerlix/Conform Wound #2 Right Toe Second: ABD pad Gauze and Kerlix/Conform Wound #5 Left Toe Great: ABD pad Gauze and Kerlix/Conform Wound #6 Left Toe Second: ABD pad Gauze and Kerlix/Conform Wound #8 Left Toe Third: ABD pad Gauze and Kerlix/Conform Wound #9 Right Toe Third: ABD pad Gauze and Kerlix/Conform Dressing Change Frequency: Wound #1 Right Toe Great: Change dressing every day. Wound #2 Right Toe Second: Change dressing every day. Wound #5 Left Toe Great: Change dressing every day. Wound #6 Left Toe Second: Change dressing every day. Wound #8 Left Toe Third: Change dressing every day. Wound #9 Right Toe Third: Change dressing every day. Follow-up Appointments: Wound #1 Right Toe Great: Return Appointment in 1 week. Wound #2 Right Toe Second: Berghuis, Xayvier E. ([id number]) Return Appointment in 1 week. Wound #5 Left Toe Great: Return Appointment in 1 week. Wound #6 Left Toe Second: Return Appointment in 1 week. Wound #8 Left Toe Third: Return Appointment in 1 week. Wound #9 Right Toe Third: Return Appointment in 1 week. Additional Orders / Instructions: Wound #1 Right Toe Great: Stop Smoking - Please continue to stay away from alcohol Other: - Please add vitamin A, vitamin C, and Zinc supplements to your diet Wound #2 Right Toe Second: Stop Smoking - Please continue to stay away from alcohol Other: - Please add vitamin A, vitamin C, and Zinc supplements to your diet Wound #5 Left Toe Great: Stop Smoking - Please continue to stay away from alcohol Other: - Please add vitamin A, vitamin C, and  Zinc supplements to your diet  Wound #6 Left Toe Second: Stop Smoking - Please continue to stay away from alcohol Other: - Please add vitamin A, vitamin C, and Zinc supplements to your diet Wound #8 Left Toe Third: Stop Smoking - Please continue to stay away from alcohol Other: - Please add vitamin A, vitamin C, and Zinc supplements to your diet Wound #9 Right Toe Third: Stop Smoking - Please continue to stay away from alcohol Other: - Please add vitamin A, vitamin C, and Zinc supplements to your diet the patient is due to have a vascular opinion soon regarding his studies status post endovenous ablation. He may also benefit from a lymph pump at that time. We will await his vascular opinion soon. I have also asked him to continue local care with antifungal ointment between his toes and silver alginate dressing to the open areas on the left foot more than right foot. 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 Date of Service: 01/14/2017 Medical Record Number: [id number] Patient Account Number: [id number] Date of Birth/Sex: April 20, 1950 (66 y.o. 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.512 Non-pressure  chronic ulcer of other part of right foot with fat layer exposed F17.218 Nicotine dependence, cigarettes, 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 abuse with unspecified alcohol-induced disorder Facility Procedures CPT4 Code: [id number] Description: 47096 - WOUND CARE VISIT-LEV 5 EST PT Modifier: Quantity: 1 Physician Procedures CPT4: Description Modifier Quantity Code [id number] - WC PHYS LEVEL 3 - EST PT 1 ICD-10 Description Diagnosis E11.621 Type 2 diabetes mellitus with foot ulcer L97.522 Non-pressure chronic ulcer of other part of left foot with fat layer exposed L97.512  Non-pressure chronic ulcer of other part of right foot with fat layer exposed I87.313 Chronic venous hypertension (idiopathic) with ulcer of bilateral lower extremity Electronic Signature(s) Signed: 01/14/2017 12:03:28 PM By: [name] Fudge MD, FACS Entered By: [name] on 01/14/2017 12:03:27

## 2017-01-21 ENCOUNTER — Ambulatory Visit: Payer: Medicare HMO | Admitting: Surgery

## 2017-01-24 ENCOUNTER — Ambulatory Visit: Payer: Medicare HMO | Admitting: Surgery

## 2017-01-28 ENCOUNTER — Ambulatory Visit
Admission: RE | Admit: 2017-01-28 | Discharge: 2017-01-28 | Disposition: A | Discharge status: Home / Self Care | Payer: Medicare HMO | Source: Ambulatory Visit | Attending: Surgery | Admitting: Surgery

## 2017-01-28 ENCOUNTER — Encounter: Payer: Medicare HMO | Admitting: Surgery

## 2017-01-28 ENCOUNTER — Other Ambulatory Visit: Payer: Self-pay | Admitting: Surgery

## 2017-01-28 DIAGNOSIS — S91301A Unspecified open wound, right foot, initial encounter: Secondary | ICD-10-CM | POA: Diagnosis not present

## 2017-01-28 DIAGNOSIS — S81801A Unspecified open wound, right lower leg, initial encounter: Secondary | ICD-10-CM

## 2017-01-28 DIAGNOSIS — E11621 Type 2 diabetes mellitus with foot ulcer: Secondary | ICD-10-CM | POA: Diagnosis not present

## 2017-01-28 DIAGNOSIS — M7989 Other specified soft tissue disorders: Secondary | ICD-10-CM | POA: Insufficient documentation

## 2017-01-28 DIAGNOSIS — X58XXXA Exposure to other specified factors, initial encounter: Secondary | ICD-10-CM | POA: Insufficient documentation

## 2017-01-28 NOTE — Progress Notes (Addendum)
[name], [name] ([id number]) Visit Report for 01/28/2017 Chief Complaint Document Details Patient Name: [name], [name]. Date of Service: 01/28/2017 9:15 AM Medical Record Number: [id number] Patient Account Number: [id number] Date of Birth/Sex: 1950/06/10 (66 y.o. Male) Treating RN: Montey Hora Primary Care Provider: Myrtie Hawk Other Clinician: Referring Provider: Myrtie Hawk Treating Provider/Extender: Frann Rider in Treatment: 18 Information Obtained from: Patient Chief Complaint Patients presents for treatment of an open diabetic ulcer to both feet Electronic Signature(s) Signed: 01/28/2017 10:05:57 AM By: Christin Fudge MD, FACS Entered By: Christin Fudge on 01/28/2017 10:05:57 [name], [name] ([id number]) -------------------------------------------------------------------------------- Debridement Details Patient Name: [name], [name] Date of Service: 01/28/2017 9:15 AM Medical Record Number: [id number] Patient Account Number: [id number] Date of Birth/Sex: Mar 24, 1951 (66 y.o. Male) Treating RN: Montey Hora Primary Care Provider: Myrtie Hawk Other Clinician: Referring Provider: Myrtie Hawk Treating Provider/Extender: Frann Rider in Treatment: 18 Debridement Performed for Wound #10 Right Toe Fourth Assessment: Performed By: Physician Christin Fudge, MD Debridement: Debridement Pre-procedure Verification/Time Yes - 09:41 Out Taken: Start Time: 09:41 Pain Control: Lidocaine 4% Topical Solution Level: Skin/Subcutaneous Tissue Total Area Debrided (L x W): 0.7 (cm) x 2 (cm) = 1.4 (cm) Tissue and other material Viable, Non-Viable, Eschar, Fibrin/Slough, Subcutaneous debrided: Instrument: Forceps, Scissors Bleeding: None End Time: 09:43 Procedural Pain: 0 Post Procedural Pain: 0 Response to Treatment: Procedure was tolerated well Post Debridement Measurements of Total Wound Length: (cm) 0.7 Width: (cm)  2 Depth: (cm) 1 Volume: (cm) 1.1 Character of Wound/Ulcer Post Debridement: Improved Post Procedure Diagnosis Same as Pre-procedure Electronic Signature(s) Signed: 01/28/2017 10:05:33 AM By: Christin Fudge MD, FACS Signed: 01/28/2017 4:16:02 PM By: Montey Hora Entered By: Christin Fudge on 01/28/2017 10:05:33 [name], [name] ([id number]) -------------------------------------------------------------------------------- Debridement Details Patient Name: [name], [name] Date of Service: 01/28/2017 9:15 AM Medical Record Number: [id number] Patient Account Number: [id number] Date of Birth/Sex: Sep 02, 1950 (66 y.o. Male) Treating RN: Montey Hora Primary Care Provider: Myrtie Hawk Other Clinician: Referring Provider: Myrtie Hawk Treating Provider/Extender: Frann Rider in Treatment: 18 Debridement Performed for Wound #5 Left Toe Great Assessment: Performed By: Physician Christin Fudge, MD Debridement: Debridement Severity of Tissue Pre Fat layer exposed Debridement: Pre-procedure Verification/Time Yes - 09:43 Out Taken: Start Time: 09:43 Pain Control: Lidocaine 4% Topical Solution Level: Skin/Subcutaneous Tissue Total Area Debrided (L x W): 1 (cm) x 1 (cm) = 1 (cm) Tissue and other material Viable, Non-Viable, Eschar, Fibrin/Slough, Subcutaneous debrided: Instrument: Forceps, Scissors Bleeding: None End Time: 09:44 Procedural Pain: 0 Post Procedural Pain: 0 Response to Treatment: Procedure was tolerated well Post Debridement Measurements of Total Wound Length: (cm) 3 Width: (cm) 6.5 Depth: (cm) 0.2 Volume: (cm) 3.063 Character of Wound/Ulcer Post Debridement: Improved Severity of Tissue Post Debridement: Fat layer exposed Post Procedure Diagnosis Same as Pre-procedure Electronic Signature(s) Signed: 01/28/2017 10:05:42 AM By: Christin Fudge MD, FACS Signed: 01/28/2017 4:16:02 PM By: Montey Hora Entered By: Christin Fudge on  01/28/2017 10:05:41 [name], [name] ([id number]) -------------------------------------------------------------------------------- HPI Details Patient Name: [name], [name] Date of Service: 01/28/2017 9:15 AM Medical Record Number: [id number] Patient Account Number: [id number] Date of Birth/Sex: 09/03/1950 (66 y.o. Male) Treating RN: Montey Hora Primary Care Provider: Myrtie Hawk Other Clinician: Referring Provider: Myrtie Hawk Treating Provider/Extender: Frann Rider in Treatment: 18 History of Present Illness Location: bilateral feet ulceration on the toes Quality: Patient reports experiencing a dull pain to affected area(s). Severity: Patient states wound are getting better Duration: Patient has had the wound for > 3 months prior to seeking treatment at  the wound center Timing: Pain in wound is constant (hurts all the time) Context: The wound would happen gradually Modifying Factors: Other treatment(s) tried include:treatment for lymphedema and is seen by the podiatrist Dr. Caryl Comes Associated Signs and Symptoms: Patient reports having increase swelling. HPI Description: 66 year old patient here to see as for bilateral feet ulceration to on his left first and second toe and 2 on his right first and second toe, which she's had for about 4 months. 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 Donella Stade. The patient is also undergoing workup by medical oncology for a intra-abdominal lymphadenopathy. 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 He is also status post appendectomy, inguinal lymph node biopsy, rectal biopsy and rectal examination under anesthesia. he currently smokes cigarettes about half packet a day. In  March of this year he was seen by Dr. Hortencia Pilar, for evaluation of bilateral varicose veins and besides wearing compression stockings he had recommended laser ablation of the right and left great saphenous veins to eleviate the symptoms and complications of severe superficial venous reflux disease. He also recommended lymphedema pumps for better control of his lymphedema. The patient recently has had on 08/23/2016, right greater saphenous vein ablation with the laser energy Earlier lower extremity venous reflux examination done on 05/08/2016 showed no DVT or SVT both lower legs but incompetence of bilateral great saphenous veins was present. A lower arterial study was also done and there was no significant right lower and left lower extremity problems based on a normal toe brachial index bilaterally and the ABI was 1.21 the left and 1.23 on the right. His post ablation venous duplex examination showed successful ablation of the right GS vein with thrombus formation 2 below the right saphenofemoral junction. The deep system was patent without evidence of thrombosis and this was done on 08/30/2016. 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 Addendum: regarding his x-rays done today and x-ray of the left foot -- IMPRESSION: No objective evidence of osteomyelitis. There are soft tissue changes which may reflect cellulitis. X-ray of the right foot -- IMPRESSION:Findings compatible with cellulitis of the toes. No objective evidence of osteomyelitis is observed. 10/01/16 on evaluation today patient's wounds appeared to be doing some better. I did review the x-rays as well which showed no evidence of osteomyelitis although there was evidence on x-ray of cellulitis. He fortunately is not having any discomfort although he continues to have some swelling. He does not remember being on any  antibiotics recently. 10/15/16 on evaluation today patient's wounds overall appear to be doing better although he does have a new location noted on the left foot. Fortunately he is not having significant pain. It almost has the appearance that something is rubbing on the end of his toes but he wears the open toe shoes and according to what he is telling me never wears anything that would rub on his foot. There is no evidence of infection and specifically no evidence of a fungal infection 10/22/16 On evaluation today patient's wounds appeared to be doing better compared to last week in regard to his bilateral [name], [name] ([id number]) lower extremities. Fortunately I happy with how  things are progressing although he still has ulcers I feel like that he is improving and appropriate manner. 11/12/16 on evaluation today patient appears to be doing well in regard to his bilateral feet and the respective wounds. We have been using surrounding her dressings along with an antifungal cream which seems to be doing very well. He has no bilateral dysfunction noticed that the rituals are weight loss at this point. He also has no nausea or vomiting a note purulent discharge. He did see Vein and vascular today and he tells me that they told him he could have surgery for his venous stasis but they did not feel like it was worth it in his words. Fortunately patient's wounds do appear to be getting sneakily better. 11/26/2016 -- he says he is going to have some surgery during this week at [facility] for possibly a colon resection. 12/31/2016 -- the patient has been noncompliant with his smoking and I'm not sure whether he is also started drinking again. He continues to be very nonchalant about his care 01/14/2017 -- the patient's HandP has been reviewed well and I understand he is being compliant with trying to give up smoking and his local dressing changes. He does not have any surgical options of 4 to him by his  vascular surgeons.he was last seen in early August by Dr. Hortencia Pilar who recommended compression stockings,and possibly lymph pumps in 2-3 months after doing a review ultrasound. 01/28/2017 - the patient did not have any fresh complaints but on examination I noted a large lacerated wound on the plantar aspect of his right fourth toe which had a lot of necrotic debris and it probes down to bone Electronic Signature(s) Signed: 01/28/2017 10:06:41 AM By: Christin Fudge MD, FACS Entered By: Christin Fudge on 01/28/2017 10:06:41 Smyth, [name] ([id number]) -------------------------------------------------------------------------------- Physical Exam Details Patient Name: Blizard, Keiran E. Date of Service: 01/28/2017 9:15 AM Medical Record Number: [id number] Patient Account Number: [id number] Date of Birth/Sex: 05-12-1950 (66 y.o. Male) Treating RN: Montey Hora Primary Care Provider: Myrtie Hawk Other Clinician: Referring Provider: Myrtie Hawk Treating Provider/Extender: Frann Rider in Treatment: 18 Constitutional . Pulse regular. Respirations normal and unlabored. Afebrile. . Eyes Nonicteric. Reactive to light. Ears, Nose, Mouth, and Throat Lips, teeth, and gums WNL.Marland Kitchen Moist mucosa without lesions. Neck supple and nontender. No palpable supraclavicular or cervical adenopathy. Normal sized without goiter. Respiratory WNL. No retractions.. Cardiovascular Pedal Pulses WNL. No clubbing, cyanosis or edema. Chest Breasts symmetical and no nipple discharge.. Breast tissue WNL, no masses, lumps, or tenderness.. Lymphatic No adneopathy. No adenopathy. No adenopathy. Musculoskeletal Adexa without tenderness or enlargement.. Digits and nails w/o clubbing, cyanosis, infection, petechiae, ischemia, or inflammatory conditions.. Integumentary (Hair, Skin) No suspicious lesions. No crepitus or fluctuance. No peri-wound warmth or erythema. No  masses.Marland Kitchen Psychiatric Judgement and insight Intact.. No evidence of depression, anxiety, or agitation.. Notes on the right fourth toe plantar aspect he had a wound with necrotic debris and almost looks like a laceration which she has not noted or has explanation for. Debridement was done with a forcep and scissors and necrotic debris removed. No proximal cellulitis. The left big toe also has some necrotic debris on the dorsal aspect which were sharply removed with a forcep and scissors. Electronic Signature(s) Signed: 01/28/2017 10:07:39 AM By: Christin Fudge MD, FACS Entered By: Christin Fudge on 01/28/2017 10:07:38 Gandolfi, [name] ([id number]) -------------------------------------------------------------------------------- Physician Orders Details Patient Name: Rhoda, Kayveon E. Date of Service: 01/28/2017 9:15 AM Medical Record Number: [id number] Patient  Account Number: [id number] Date of Birth/Sex: 02/01/1951 (66 y.o. Male) Treating RN: Montey Hora Primary Care Provider: Myrtie Hawk Other Clinician: Referring Provider: Myrtie Hawk Treating Provider/Extender: Frann Rider in Treatment: 60 Verbal / Phone Orders: No Diagnosis Coding Wound Cleansing Wound #1 Right Toe Great o Clean wound with Normal Saline. o May Shower, gently pat wound dry prior to applying new dressing. Wound #10 Right Toe Fourth o Clean wound with Normal Saline. o May Shower, gently pat wound dry prior to applying new dressing. Wound #2 Right Toe Second o Clean wound with Normal Saline. o May Shower, gently pat wound dry prior to applying new dressing. Wound #5 Left Toe Great o Clean wound with Normal Saline. o May Shower, gently pat wound dry prior to applying new dressing. Wound #6 Left Toe Second o Clean wound with Normal Saline. o May Shower, gently pat wound dry prior to applying new dressing. Wound #8 Left Toe Third o Clean wound with Normal  Saline. o May Shower, gently pat wound dry prior to applying new dressing. Wound #9 Right Toe Third o Clean wound with Normal Saline. o May Shower, gently pat wound dry prior to applying new dressing. Anesthetic Wound #1 Right Toe Great o Topical Lidocaine 4% cream applied to wound bed prior to debridement Wound #10 Right Toe Fourth o Topical Lidocaine 4% cream applied to wound bed prior to debridement Wound #2 Right Toe Second o Topical Lidocaine 4% cream applied to wound bed prior to debridement Wound #5 Left Toe Great o Topical Lidocaine 4% cream applied to wound bed prior to debridement Wound #6 Left Toe Second o Topical Lidocaine 4% cream applied to wound bed prior to debridement [name], [name] ([id number]) Wound #8 Left Toe Third o Topical Lidocaine 4% cream applied to wound bed prior to debridement Wound #9 Right Toe Third o Topical Lidocaine 4% cream applied to wound bed prior to debridement Primary Wound Dressing Wound #1 Right Toe Great o Silvercel Non-Adherent Wound #10 Right Toe Fourth o [place]cel Non-Adherent Wound #2 Right Toe Second o [place]cel Non-Adherent Wound #5 Left Toe Great o [place]cel Non-Adherent Wound #6 Left Toe Second o [place]cel Non-Adherent Wound #8 Left Toe Third o [place]cel Non-Adherent Wound #9 Right Toe Third o Silvercel Non-Adherent Secondary Dressing Wound #1 Right Toe Great o Gauze and Kerlix/Conform Wound #10 Right Toe Fourth o Gauze and Kerlix/Conform Wound #2 Right Toe Second o Gauze and Kerlix/Conform Wound #5 Left Toe Great o Gauze and Kerlix/Conform Wound #6 Left Toe Second o Gauze and Kerlix/Conform Wound #8 Left Toe Third o Gauze and Kerlix/Conform Wound #9 Right Toe Third o Gauze and Kerlix/Conform Dressing Change Frequency Wound #1 Right Toe Great o Change dressing every day. Wound #10 Right Toe Fourth [name], [name] ([id number]) o Change dressing every day. Wound  #2 Right Toe Second o Change dressing every day. Wound #5 Left Toe Great o Change dressing every day. Wound #6 Left Toe Second o Change dressing every day. Wound #8 Left Toe Third o Change dressing every day. Wound #9 Right Toe Third o Change dressing every day. Follow-up Appointments Wound #1 Right Toe Great o Return Appointment in 1 week. Wound #10 Right Toe Fourth o Return Appointment in 1 week. Wound #2 Right Toe Second o Return Appointment in 1 week. Wound #5 Left Toe Great o Return Appointment in 1 week. Wound #6 Left Toe Second o Return Appointment in 1 week. Wound #8 Left Toe Third o Return Appointment in 1 week. Wound #9  Right Toe Third o Return Appointment in 1 week. Additional Orders / Instructions Wound #1 Right Toe Great o Stop Smoking o Increase protein intake. o Other: - Please add vitamin A, vitamin C and zinc supplements to your diet Wound #10 Right Toe Fourth o Stop Smoking o Increase protein intake. o Other: - Please add vitamin A, vitamin C and zinc supplements to your diet Wound #2 Right Toe Second o Stop Smoking o Increase protein intake. o Other: - Please add vitamin A, vitamin C and zinc supplements to your diet Wound #5 Left Toe Great o Stop Smoking [name], [name] ([id number]) o Increase protein intake. o Other: - Please add vitamin A, vitamin C and zinc supplements to your diet Wound #6 Left Toe Second o Stop Smoking o Increase protein intake. o Other: - Please add vitamin A, vitamin C and zinc supplements to your diet Wound #8 Left Toe Third o Stop Smoking o Increase protein intake. o Other: - Please add vitamin A, vitamin C and zinc supplements to your diet Wound #9 Right Toe Third o Stop Smoking o Increase protein intake. o Other: - Please add vitamin A, vitamin C and zinc supplements to your diet Medications-please add to medication list. Wound #10 Right Toe  Fourth o P.O. Antibiotics - doxycycline Radiology o X-ray, foot - right foot Patient Medications Allergies: No Known Drug Allergies Notifications Medication Indication Start End doxycycline hyclate 01/28/2017 DOSE 1 - oral 100 mg capsule - 1 capsule oral bid Electronic Signature(s) Signed: 01/28/2017 12:49:35 PM By: Christin Fudge MD, FACS Signed: 01/28/2017 4:16:02 PM By: Montey Hora Previous Signature: 01/28/2017 9:47:58 AM Version By: Christin Fudge MD, FACS Entered By: Montey Hora on 01/28/2017 09:51:29 Forinash, [name] ([id number]) -------------------------------------------------------------------------------- Problem List Details Patient Name: [name], [name] Date of Service: 01/28/2017 9:15 AM Medical Record Number: [id number] Patient Account Number: [id number] Date of Birth/Sex: 1951-01-29 (66 y.o. Male) Treating RN: Montey Hora Primary Care Provider: Myrtie Hawk Other Clinician: Referring Provider: Myrtie Hawk Treating Provider/Extender: Frann Rider in Treatment: 70 Active Problems ICD-10 Encounter Code Description Active Date Diagnosis E11.621 Type 2 diabetes mellitus with foot ulcer 09/21/2016 Yes I87.313 Chronic venous hypertension (idiopathic) with ulcer of bilateral 09/21/2016 Yes lower extremity I89.0 Lymphedema, not elsewhere classified 09/21/2016 Yes L97.522 Non-pressure chronic ulcer of other part of left foot with fat layer 09/21/2016 Yes exposed L97.512 Non-pressure chronic ulcer of other part of right foot with fat layer 09/21/2016 Yes exposed F17.218 Nicotine dependence, cigarettes, with other nicotine-induced 09/21/2016 Yes disorders F10.19 Alcohol abuse with unspecified alcohol-induced disorder 09/21/2016 Yes Inactive Problems Resolved Problems Electronic Signature(s) Signed: 01/28/2017 10:05:08 AM By: Christin Fudge MD, FACS Entered By: Christin Fudge on 01/28/2017 10:05:08 Doucet, Jagar E.  ([id number]) -------------------------------------------------------------------------------- Progress Note Details Patient Name: [name], Daeveon E. Date of Service: 01/28/2017 9:15 AM Medical Record Number: [id number] Patient Account Number: [id number] Date of Birth/Sex: 1950-06-09 (66 y.o. Male) Treating RN: Montey Hora Primary Care Provider: Myrtie Hawk Other Clinician: Referring Provider: Myrtie Hawk Treating Provider/Extender: Frann Rider in Treatment: 18 Subjective Chief Complaint Information obtained from Patient Patients presents for treatment of an open diabetic ulcer to both feet History of Present Illness (HPI) The following HPI elements were documented for the patient's wound: Location: bilateral feet ulceration on the toes Quality: Patient reports experiencing a dull pain to affected area(s). Severity: Patient states wound are getting better Duration: Patient has had the wound for > 3 months prior to seeking treatment at the wound center Timing: Pain in wound is  constant (hurts all the time) Context: The wound would happen gradually Modifying Factors: Other treatment(s) tried include:treatment for lymphedema and is seen by the podiatrist Dr. Caryl Comes Associated Signs and Symptoms: Patient reports having increase swelling. 66 year old patient here to see as for bilateral feet ulceration to on his left first and second toe and 2 on his right first and second toe, which she's had for about 4 months. 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 Donella Stade. The patient is also undergoing workup by medical oncology for a intra-abdominal lymphadenopathy. 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 He is also status  post appendectomy, inguinal lymph node biopsy, rectal biopsy and rectal examination under anesthesia. he currently smokes cigarettes about half packet a day. In March of this year he was seen by Dr. Hortencia Pilar, for evaluation of bilateral varicose veins and besides wearing compression stockings he had recommended laser ablation of the right and left great saphenous veins to eleviate the symptoms and complications of severe superficial venous reflux disease. He also recommended lymphedema pumps for better control of his lymphedema. The patient recently has had on 08/23/2016, right greater saphenous vein ablation with the laser energy Earlier lower extremity venous reflux examination done on 05/08/2016 showed no DVT or SVT both lower legs but incompetence of bilateral great saphenous veins was present. A lower arterial study was also done and there was no significant right lower and left lower extremity problems based on a normal toe brachial index bilaterally and the ABI was 1.21 the left and 1.23 on the right. His post ablation venous duplex examination showed successful ablation of the right GS vein with thrombus formation 2 below the right saphenofemoral junction. The deep system was patent without evidence of thrombosis and this was done on 08/30/2016. 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 Addendum: regarding his x-rays done today and x-ray of the left foot -- IMPRESSION: No objective evidence of osteomyelitis. There are soft tissue changes which may reflect cellulitis. X-ray of the right foot -- IMPRESSION:Findings compatible with cellulitis of the toes. No objective evidence of osteomyelitis is observed. 10/01/16 on evaluation today patient's wounds appeared to be doing some better. I did review the x-rays as well which showed no evidence of osteomyelitis although there  was evidence on x-ray of cellulitis. He fortunately is not having any discomfort [name], [name] ([id number]) although he continues to have some swelling. He does not remember being on any antibiotics recently. 10/15/16 on evaluation today patient's wounds overall appear to be doing better although he does have a new location noted on the left foot. Fortunately he is not having significant pain. It almost has the appearance that something is rubbing on the end of his toes but he wears the open toe shoes and according to what he is telling me never wears anything that would rub on his foot. There is no evidence of infection and specifically no evidence of a fungal infection 10/22/16 On evaluation today patient's wounds appeared to be doing better compared to last week in regard to his bilateral lower extremities. Fortunately I happy with how things are progressing although he still has ulcers I feel  like that he is improving and appropriate manner. 11/12/16 on evaluation today patient appears to be doing well in regard to his bilateral feet and the respective wounds. We have been using surrounding her dressings along with an antifungal cream which seems to be doing very well. He has no bilateral dysfunction noticed that the rituals are weight loss at this point. He also has no nausea or vomiting a note purulent discharge. He did see Vein and vascular today and he tells me that they told him he could have surgery for his venous stasis but they did not feel like it was worth it in his words. Fortunately patient's wounds do appear to be getting sneakily better. 11/26/2016 -- he says he is going to have some surgery during this week at [facility] for possibly a colon resection. 12/31/2016 -- the patient has been noncompliant with his smoking and I'm not sure whether he is also started drinking again. He continues to be very nonchalant about his care 01/14/2017 -- the patient's HandP has been reviewed well  and I understand he is being compliant with trying to give up smoking and his local dressing changes. He does not have any surgical options of 4 to him by his vascular surgeons.he was last seen in early August by Dr. Hortencia Pilar who recommended compression stockings,and possibly lymph pumps in 2-3 months after doing a review ultrasound. 01/28/2017 - the patient did not have any fresh complaints but on examination I noted a large lacerated wound on the plantar aspect of his right fourth toe which had a lot of necrotic debris and it probes down to bone Objective Constitutional Pulse regular. Respirations normal and unlabored. Afebrile. Vitals Time Taken: 9:25 AM, Height: 69 in, Weight: 168 lbs, BMI: 24.8, Temperature: 97.8 F, Pulse: 66 bpm, Respiratory Rate: 18 breaths/min, Blood Pressure: 163/96 mmHg. Eyes Nonicteric. Reactive to light. Ears, Nose, Mouth, and Throat Lips, teeth, and gums WNL.Marland Kitchen Moist mucosa without lesions. Neck supple and nontender. No palpable supraclavicular or cervical adenopathy. Normal sized without goiter. Respiratory WNL. No retractions.. Cardiovascular Pedal Pulses WNL. No clubbing, cyanosis or edema. JANZIEL, HOCKETT ([id number]) Chest Breasts symmetical and no nipple discharge.. Breast tissue WNL, no masses, lumps, or tenderness.. Lymphatic No adneopathy. No adenopathy. No adenopathy. Musculoskeletal Adexa without tenderness or enlargement.. Digits and nails w/o clubbing, cyanosis, infection, petechiae, ischemia, or inflammatory conditions.Marland Kitchen Psychiatric Judgement and insight Intact.. No evidence of depression, anxiety, or agitation.. General Notes: on the right fourth toe plantar aspect he had a wound with necrotic debris and almost looks like a laceration which she has not noted or has explanation for. Debridement was done with a forcep and scissors and necrotic debris removed. No proximal cellulitis. The left big toe also has some necrotic debris on  the dorsal aspect which were sharply removed with a forcep and scissors. Integumentary (Hair, Skin) No suspicious lesions. No crepitus or fluctuance. No peri-wound warmth or erythema. No masses.. Wound #1 status is Open. Original cause of wound was Gradually Appeared. The wound is located on the Right Toe Great. The wound measures 1.6cm length x 1.8cm width x 0.2cm depth; 2.262cm^2 area and 0.452cm^3 volume. There is Fat Layer (Subcutaneous Tissue) Exposed exposed. There is no tunneling or undermining noted. There is a large amount of serosanguineous drainage noted. Foul odor after cleansing was noted. The wound margin is flat and intact. There is medium (34-66%) pink granulation within the wound bed. There is a medium (34-66%) amount of necrotic tissue within the wound  bed including Adherent Slough. The periwound skin appearance exhibited: Excoriation, Maceration. The periwound skin appearance did not exhibit: Callus, Crepitus, Induration, Rash, Scarring, Dry/Scaly, Atrophie Blanche, Cyanosis, Ecchymosis, Hemosiderin Staining, Mottled, Pallor, Rubor, Erythema. Periwound temperature was noted as No Abnormality. Wound #10 status is Open. Original cause of wound was Not Known. The wound is located on the Right Toe Fourth. The wound measures 0.7cm length x 2cm width x 0.9cm depth; 1.1cm^2 area and 0.99cm^3 volume. There is Fat Layer (Subcutaneous Tissue) Exposed exposed. There is no tunneling or undermining noted. There is a large amount of serous drainage noted. Foul odor after cleansing was noted. The wound margin is flat and intact. There is small (1-33%) red granulation within the wound bed. There is a large (67-100%) amount of necrotic tissue within the wound bed including Eschar and Adherent Slough. The periwound skin appearance did not exhibit: Callus, Crepitus, Excoriation, Induration, Rash, Scarring, Dry/Scaly, Maceration, Atrophie Blanche, Cyanosis, Ecchymosis, Hemosiderin Staining, Mottled,  Pallor, Rubor, Erythema. Periwound temperature was noted as No Abnormality. Wound #2 status is Open. Original cause of wound was Gradually Appeared. The wound is located on the Right Toe Second. The wound measures 0.6cm length x 0.7cm width x 0.1cm depth; 0.33cm^2 area and 0.033cm^3 volume. There is Fat Layer (Subcutaneous Tissue) Exposed exposed. There is no tunneling or undermining noted. There is a large amount of serosanguineous drainage noted. Foul odor after cleansing was noted. The wound margin is flat and intact. There is large (67-100%) pink granulation within the wound bed. There is no necrotic tissue within the wound bed. The periwound skin appearance exhibited: Excoriation, Maceration. The periwound skin appearance did not exhibit: Callus, Crepitus, Induration, Rash, Scarring, Dry/Scaly, Atrophie Blanche, Cyanosis, Ecchymosis, Hemosiderin Staining, Mottled, Pallor, Rubor, Erythema. Periwound temperature was noted as No Abnormality. Wound #5 status is Open. Original cause of wound was Gradually Appeared. The wound is located on the Left Toe Great. The wound measures 3cm length x 6.5cm width x 0.1cm depth; 15.315cm^2 area and 1.532cm^3 volume. There is Fat Layer (Subcutaneous Tissue) Exposed exposed. There is no tunneling or undermining noted. There is a large amount of serous drainage noted. Foul odor after cleansing was noted. The wound margin is flat and intact. There is large (67-100%) pink granulation within the wound bed. There is a small (1-33%) amount of necrotic tissue within the wound bed including Adherent Slough. The periwound skin appearance exhibited: Excoriation. The periwound skin appearance did not exhibit: Callus, Crepitus, Induration, Rash, Scarring, Dry/Scaly, Maceration, Atrophie Blanche, Cyanosis, Ecchymosis, Hemosiderin Staining, Mottled, Pallor, Rubor, Erythema. Periwound temperature was noted as No Abnormality. Wound #6 status is Open. Original cause of wound was  Gradually Appeared. The wound is located on the Left Toe Second. [name], [name] ([id number]) The wound measures 0.2cm length x 0.2cm width x 0.1cm depth; 0.031cm^2 area and 0.003cm^3 volume. There is Fat Layer (Subcutaneous Tissue) Exposed exposed. There is no tunneling or undermining noted. There is a large amount of serous drainage noted. Foul odor after cleansing was noted. The wound margin is flat and intact. There is medium (34-66%) pink granulation within the wound bed. There is a medium (34-66%) amount of necrotic tissue within the wound bed including Adherent Slough. The periwound skin appearance exhibited: Excoriation, Maceration. The periwound skin appearance did not exhibit: Callus, Crepitus, Induration, Rash, Scarring, Dry/Scaly, Atrophie Blanche, Cyanosis, Ecchymosis, Hemosiderin Staining, Mottled, Pallor, Rubor, Erythema. Periwound temperature was noted as No Abnormality. Wound #8 status is Open. Original cause of wound was Gradually Appeared. The wound is  located on the Left Toe Third. The wound measures 0.2cm length x 0.2cm width x 0.1cm depth; 0.031cm^2 area and 0.003cm^3 volume. There is no tunneling or undermining noted. There is a large amount of serosanguineous drainage noted. The wound margin is distinct with the outline attached to the wound base. There is large (67-100%) red granulation within the wound bed. There is no necrotic tissue within the wound bed. The periwound skin appearance exhibited: Maceration. Periwound temperature was noted as No Abnormality. The periwound has tenderness on palpation. Wound #9 status is Open. Original cause of wound was Gradually Appeared. The wound is located on the Right Toe Third. The wound measures 0.6cm length x 0.5cm width x 0.1cm depth; 0.236cm^2 area and 0.024cm^3 volume. There is no tunneling or undermining noted. There is a large amount of serous drainage noted. Foul odor after cleansing was noted. The wound margin is flat and  intact. There is medium (34-66%) pink granulation within the wound bed. There is a medium (34- 66%) amount of necrotic tissue within the wound bed including Adherent Slough. The periwound skin appearance did not exhibit: Callus, Crepitus, Excoriation, Induration, Rash, Scarring, Dry/Scaly, Maceration, Atrophie Blanche, Cyanosis, Ecchymosis, Hemosiderin Staining, Mottled, Pallor, Rubor, Erythema. Periwound temperature was noted as No Abnormality. 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 There was a Skin/Subcutaneous Tissue Debridement ([id number]) debridement with total area of 1.4 sq cm performed by Christin Fudge, MD. with the following instrument(s): Forceps and Scissors to remove Viable and Non-Viable tissue/material including Fibrin/Slough, Eschar, and Subcutaneous after achieving pain control using Lidocaine 4% Topical Solution. A time out was conducted at 09:41, prior to the start of the procedure. There was no bleeding. The procedure was tolerated well with a pain level of 0 throughout and a pain level of 0 following the procedure. Post Debridement Measurements: 0.7cm length x 2cm width x 1cm depth; 1.1cm^3 volume. Character of Wound/Ulcer Post Debridement is improved. Post procedure Diagnosis Wound #10: Same as Pre-Procedure [name], [name] ([id number]) Wound #5 Pre-procedure  diagnosis of Wound #5 is a Diabetic Wound/Ulcer of the Lower Extremity located on the Left Toe Great .Severity of Tissue Pre Debridement is: Fat layer exposed. There was a Skin/Subcutaneous Tissue Debridement ([id number]) debridement with total area of 1 sq cm performed by Christin Fudge, MD. with the following instrument(s): Forceps and Scissors to remove Viable and Non-Viable tissue/material including Fibrin/Slough, Eschar, and Subcutaneous after achieving pain control using Lidocaine 4% Topical Solution. A time out was conducted at 09:43, prior to the start of the procedure. There was no bleeding. The procedure was tolerated well with a pain level of 0 throughout and a pain level of 0 following the procedure. Post Debridement Measurements: 3cm length x 6.5cm width x 0.2cm depth; 3.063cm^3 volume. Character of Wound/Ulcer Post Debridement is improved. Severity of Tissue Post Debridement is: Fat layer exposed. Post procedure Diagnosis Wound #5: Same as Pre-Procedure Plan Wound Cleansing: Wound #1 Right Toe Great: Clean wound with Normal Saline. May Shower,  gently pat wound dry prior to applying new dressing. Wound #10 Right Toe Fourth: Clean wound with Normal Saline. May Shower, gently pat wound dry prior to applying new dressing. Wound #2 Right Toe Second: Clean wound with Normal Saline. May Shower, gently pat wound dry prior to applying new dressing. Wound #5 Left Toe Great: Clean wound with Normal Saline. May Shower, gently pat wound dry prior to applying new dressing. Wound #6 Left Toe Second: Clean wound with Normal Saline. May Shower, gently pat wound dry prior to applying new dressing. Wound #8 Left Toe Third: Clean wound with Normal Saline. May Shower, gently pat wound dry prior to applying new dressing. Wound #9 Right Toe Third: Clean wound with Normal Saline. May Shower, gently pat wound dry prior to applying new dressing. Anesthetic: Wound #1 Right Toe Great: Topical  Lidocaine 4% cream applied to wound bed prior to debridement Wound #10 Right Toe Fourth: Topical Lidocaine 4% cream applied to wound bed prior to debridement Wound #2 Right Toe Second: Topical Lidocaine 4% cream applied to wound bed prior to debridement Wound #5 Left Toe Great: Topical Lidocaine 4% cream applied to wound bed prior to debridement Wound #6 Left Toe Second: Topical Lidocaine 4% cream applied to wound bed prior to debridement Wound #8 Left Toe Third: Topical Lidocaine 4% cream applied to wound bed prior to debridement Wound #9 Right Toe Third: Topical Lidocaine 4% cream applied to wound bed prior to debridement Primary Wound Dressing: Wound #1 Right Toe Great: Silvercel Non-Adherent Wound #10 Right Toe Fourth: STORY, VANVRANKEN ([id number]) Silvercel Non-Adherent Wound #2 Right Toe Second: Silvercel Non-Adherent Wound #5 Left Toe Great: Silvercel Non-Adherent Wound #6 Left Toe Second: Silvercel Non-Adherent Wound #8 Left Toe Third: Silvercel Non-Adherent Wound #9 Right Toe Third: Silvercel Non-Adherent Secondary Dressing: Wound #1 Right Toe Great: Gauze and Kerlix/Conform Wound #10 Right Toe Fourth: Gauze and Kerlix/Conform Wound #2 Right Toe Second: Gauze and Kerlix/Conform Wound #5 Left Toe Great: Gauze and Kerlix/Conform Wound #6 Left Toe Second: Gauze and Kerlix/Conform Wound #8 Left Toe Third: Gauze and Kerlix/Conform Wound #9 Right Toe Third: Gauze and Kerlix/Conform Dressing Change Frequency: Wound #1 Right Toe Great: Change dressing every day. Wound #10 Right Toe Fourth: Change dressing every day. Wound #2 Right Toe Second: Change dressing every day. Wound #5 Left Toe Great: Change dressing every day. Wound #6 Left Toe Second: Change dressing every day. Wound #8 Left Toe Third: Change dressing every day. Wound #9 Right Toe Third: Change dressing every day. Follow-up Appointments: Wound #1 Right Toe Great: Return Appointment in 1  week. Wound #10 Right Toe Fourth: Return Appointment in 1 week. Wound #2 Right Toe Second: Return Appointment in 1 week. Wound #5 Left Toe Great: Return Appointment in 1 week. Wound #6 Left Toe Second: Return Appointment in 1 week. Wound #8 Left Toe Third: Return Appointment in 1 week. Wound #9 Right Toe Third: Return Appointment in 1 week. Additional Orders / Instructions: Wound #1 Right Toe Great: Stop Smoking Increase protein intake. STEPHON, WEATHERS ([id number]) Other: - Please add vitamin A, vitamin C and zinc supplements to your diet Wound #10 Right Toe Fourth: Stop Smoking Increase protein intake. Other: - Please add vitamin A, vitamin C and zinc supplements to your diet Wound #2 Right Toe Second: Stop Smoking Increase protein intake. Other: - Please add vitamin A, vitamin C and zinc supplements to your diet Wound #5 Left Toe Great: Stop Smoking Increase protein intake. Other: - Please add vitamin A, vitamin C  and zinc supplements to your diet Wound #6 Left Toe Second: Stop Smoking Increase protein intake. Other: - Please add vitamin A, vitamin C and zinc supplements to your diet Wound #8 Left Toe Third: Stop Smoking Increase protein intake. Other: - Please add vitamin A, vitamin C and zinc supplements to your diet Wound #9 Right Toe Third: Stop Smoking Increase protein intake. Other: - Please add vitamin A, vitamin C and zinc supplements to your diet Medications-please add to medication list.: Wound #10 Right Toe Fourth: P.O. Antibiotics - doxycycline Radiology ordered were: X-ray, foot - right foot The following medication(s) was prescribed: doxycycline hyclate oral 100 mg capsule 1 1 capsule oral bid starting 01/28/2017 after review of his new wound today on the plantar aspect of his right fourth toe I have recommended 1. packing this with Silver alginate and an appropriate foam off loading. 2. x-ray of his right foot with emphasis on the right fourth  toe. 3. the patient is due to have a vascular opinion soon regarding his studies status post endovenous ablation. He may also benefit from a lymph pump at that time. We will await his vascular opinion soon. I have also asked him to continue local care with antifungal ointment between his toes and silver alginate dressing to the open areas on the left foot more than right foot. 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 Date of Service: 01/28/2017 Medical Record Number: [id number] Patient Account Number: [id number] Date of Birth/Sex: [birth year] (66 y.o. 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 F17.218 Nicotine dependence, cigarettes, 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 abuse with unspecified alcohol-induced disorder Facility Procedures CPT4 Code Description: [id number] - DEB SUBQ TISSUE 20 SQ CM/< ICD-10 Diagnosis Description E11.621 Type 2 diabetes mellitus with foot ulcer L97.512 Non-pressure chronic ulcer of other part  of right foot with fat L97.522 Non-pressure chronic ulcer of  other part of left foot with fat l I87.313 Chronic venous hypertension (idiopathic) with ulcer of bilateral Modifier: layer exposed ayer exposed lower extremi Quantity: 1 ty Physician Procedures CPT4 Code Description: [id number] [id number] - WC PHYS LEVEL 3 - EST PT ICD-10 Diagnosis Description E11.621 Type 2 diabetes mellitus with foot ulcer I87.313 Chronic venous hypertension (idiopathic) with ulcer of bilateral L97.512 Non-pressure chronic ulcer of  other part of right foot with fat L97.522 Non-pressure chronic ulcer of other part of left foot with fat l Modifier: 25 lower extremi layer exposed ayer exposed Quantity: 1 ty CPT4 Code Description: [id number] - WC PHYS SUBQ TISS 20 SQ CM ICD-10 Diagnosis Description E11.621 Type 2 diabetes mellitus with foot ulcer L97.512 Non-pressure chronic ulcer of other part of right foot with fat L97.522 Non-pressure chronic ulcer of  other part of left foot with fat l I87.313 Chronic venous hypertension (idiopathic) with ulcer  of bilateral Modifier: layer exposed ayer exposed lower extremi Quantity: 1 ty Electronic Signature(s) Signed: 01/28/2017 10:10:36 AM By: Christin Fudge MD, FACS Kleppe, ANTRON SETH ([id number]) Entered By: Christin Fudge on 01/28/2017 10:10:36

## 2017-01-31 NOTE — Progress Notes (Signed)
[name], [name] ([id number]) Visit Report for 01/28/2017 Arrival Information Details Patient Name: [name], [name] Date of Service: 01/28/2017 9:15 AM Medical Record Number: [id number] Patient Account Number: [id number] Date of Birth/Sex: 24-Nov-1950 (66 y.o. Male) Treating RN: Montey Hora Primary Care Glenette Bookwalter: Myrtie Hawk Other Clinician: Referring Minerva Bluett: Myrtie Hawk Treating Richad Ramsay/Extender: Frann Rider in Treatment: 18 Visit Information History Since Last Visit Added or deleted any medications: No Patient Arrived: Ambulatory Any new allergies or adverse reactions: No Arrival Time: 09:23 Had a fall or experienced change in No Accompanied By: brother activities of daily living that may affect Transfer Assistance: None risk of falls: Patient Identification Verified: Yes Signs or symptoms of abuse/neglect since last visito No Secondary Verification Process Completed: Yes Hospitalized since last visit: No Patient Requires Transmission-Based No Has Dressing in Place as Prescribed: Yes Precautions: Pain Present Now: No Patient Has Alerts: Yes Patient Alerts: DMII Electronic Signature(s) Signed: 01/28/2017 4:16:02 PM By: Montey Hora Entered By: Montey Hora on 01/28/2017 09:24:02 [name], [name] ([id number]) -------------------------------------------------------------------------------- Encounter Discharge Information Details Patient Name: [name], [name] Date of Service: 01/28/2017 9:15 AM Medical Record Number: [id number] Patient Account Number: [id number] Date of Birth/Sex: [birth year] (66 y.o. Male) Treating RN: Montey Hora Primary Care Cary Wilford: Myrtie Hawk Other Clinician: Referring Heavenlee Maiorana: Myrtie Hawk Treating Janel Beane/Extender: Frann Rider in Treatment: 71 Encounter Discharge Information Items Discharge Pain Level: 0 Discharge Condition: Stable Ambulatory Status: Ambulatory Discharge  Destination: Home Transportation: Private Auto Accompanied By: brother Schedule Follow-up Appointment: Yes Medication Reconciliation completed and No provided to Patient/Care Adyan Palau: Provided on Clinical Summary of Care: 01/28/2017 Form Type Recipient Paper Patient ED Electronic Signature(s) Signed: 01/29/2017 12:59:36 PM By: Ruthine Dose Entered By: Ruthine Dose on 01/28/2017 10:10:09 Hofmeister, [name] ([id number]) -------------------------------------------------------------------------------- Lower Extremity Assessment Details Patient Name: [name], [name] Date of Service: 01/28/2017 9:15 AM Medical Record Number: [id number] Patient Account Number: [id number] Date of Birth/Sex: 06-03-1950 (66 y.o. Male) Treating RN: Montey Hora Primary Care Amyr Sluder: Myrtie Hawk Other Clinician: Referring Anayeli [name]: Myrtie Hawk Treating Baylyn Sickles/Extender: Frann Rider in Treatment: 18 Vascular Assessment Pulses: Dorsalis Pedis Palpable: [Left:Yes] [Right:Yes] Posterior Tibial Extremity colors, hair growth, and conditions: Extremity Color: [Left:Hyperpigmented] [Right:Hyperpigmented] Hair Growth on Extremity: [Left:No] [Right:No] Temperature of Extremity: [Left:Warm] [Right:Warm] Capillary Refill: [Left:< 3 seconds] [Right:< 3 seconds] Electronic Signature(s) Signed: 01/28/2017 4:16:02 PM By: Montey Hora Entered By: Montey Hora on 01/28/2017 09:34:48 [name], [name] ([id number]) -------------------------------------------------------------------------------- Multi Wound Chart Details Patient Name: [name], [name] Date of Service: 01/28/2017 9:15 AM Medical Record Number: [id number] Patient Account Number: [id number] Date of Birth/Sex: 1950/07/30 (66 y.o. Male) Treating RN: Montey Hora Primary Care Titania Gault: Myrtie Hawk Other Clinician: Referring Bryanna Yim: Myrtie Hawk Treating Sascha Baugher/Extender: Frann Rider in  Treatment: 18 Vital Signs Height(in): 34 Pulse(bpm): 75 Weight(lbs): 168 Blood Pressure(mmHg): 163/96 Body Mass Index(BMI): 25 Temperature(F): 97.8 Respiratory Rate 18 (breaths/min): Photos: [1:No Photos] [10:No Photos] [2:No Photos] Wound Location: [1:Right Toe Great] [10:Right Toe Fourth] [2:Right Toe Second] Wounding Event: [1:Gradually Appeared] [10:Not Known] [2:Gradually Appeared] Primary Etiology: [1:Diabetic Wound/Ulcer of the Lower Extremity] [10:To be determined] [2:Diabetic Wound/Ulcer of the Lower Extremity] Comorbid History: [1:Anemia, Lymphedema, Congestive Heart Failure, Hypertension, Peripheral Venous Disease, Type II Diabetes, Neuropathy] [10:Anemia, Lymphedema, Congestive Heart Failure, Hypertension, Peripheral Venous Disease, Type II Diabetes,  Neuropathy] [2:Anemia, Lymphedema, Congestive Heart Failure, Hypertension, Peripheral Venous Disease, Type II Diabetes, Neuropathy] Date Acquired: [1:06/11/2016] [10:01/28/2017] [2:06/11/2016] Weeks of Treatment: [1:18] [10:0] [2:18] Wound Status: [1:Open] [10:Open] [2:Open] Pending Amputation on [1:Yes] [10:No] [2:Yes] Presentation: Measurements L x W  x D [1:1.6x1.8x0.2] [10:0.7x2x0.9] [2:0.6x0.7x0.1] (cm) Area (cm) : [1:2.262] [10:1.1] [2:0.33] Volume (cm) : [1:0.452] [10:0.99] [2:0.033] % Reduction in Area: [1:95.20%] [10:N/A] [2:89.30%] % Reduction in Volume: [1:90.40%] [10:N/A] [2:89.30%] Classification: [1:Grade 1] [10:Full Thickness With Exposed Support Structures] [2:Grade 1] Exudate Amount: [1:Large] [10:Large] [2:Large] Exudate Type: [1:Serosanguineous] [10:Serous] [2:Serosanguineous] Exudate Color: [1:red, brown] [10:amber] [2:red, brown] Foul Odor After Cleansing: [1:Yes] [10:Yes] [2:Yes] Odor Anticipated Due to [1:No] [10:No] [2:No] Product Use: Wound Margin: [1:Flat and Intact] [10:Flat and Intact] [2:Flat and Intact] Granulation Amount: [1:Medium (34-66%)] [10:Small (1-33%)] [2:Large  (67-100%)] Granulation Quality: [1:Pink] [10:Red] [2:Pink] Necrotic Amount: [1:Medium (34-66%)] [10:Large (67-100%)] [2:None Present (0%)] Necrotic Tissue: [1:Adherent Slough] [10:Eschar, Adherent Slough] [2:N/A] Exposed Structures: [1:Fat Layer (Subcutaneous Tissue) Exposed: Yes Fascia: No] [10:Fat Layer (Subcutaneous Tissue) Exposed: Yes Fascia: No] [2:Fat Layer (Subcutaneous Tissue) Exposed: Yes Fascia: No] Tendon: No Tendon: No Tendon: No Muscle: No Muscle: No Muscle: No Joint: No Joint: No Joint: No Bone: No Bone: No Bone: No Epithelialization: Large (67-100%) None Small (1-33%) Debridement: N/A Debridement ([id number]) N/A Pre-procedure N/A 09:41 N/A Verification/Time Out Taken: Pain Control: N/A Lidocaine 4% Topical Solution N/A Tissue Debrided: N/A Necrotic/Eschar, N/A Fibrin/Slough, Subcutaneous Level: N/A Skin/Subcutaneous Tissue N/A Debridement Area (sq cm): N/A 1.4 N/A Instrument: N/A Forceps, Scissors N/A Bleeding: N/A None N/A Procedural Pain: N/A 0 N/A Post Procedural Pain: N/A 0 N/A Debridement Treatment N/A Procedure was tolerated well N/A Response: Post Debridement N/A 0.7x2x1 N/A Measurements L x W x D (cm) Post Debridement Volume: N/A 1.1 N/A (cm) Periwound Skin Texture: Excoriation: Yes Excoriation: No Excoriation: Yes Induration: No Induration: No Induration: No Callus: No Callus: No Callus: No Crepitus: No Crepitus: No Crepitus: No Rash: No Rash: No Rash: No Scarring: No Scarring: No Scarring: No Periwound Skin Moisture: Maceration: Yes Maceration: No Maceration: Yes Dry/Scaly: No Dry/Scaly: No Dry/Scaly: No Periwound Skin Color: Atrophie Blanche: No Atrophie Blanche: No Atrophie Blanche: No Cyanosis: No Cyanosis: No Cyanosis: No Ecchymosis: No Ecchymosis: No Ecchymosis: No Erythema: No Erythema: No Erythema: No Hemosiderin Staining: No Hemosiderin Staining: No Hemosiderin Staining: No Mottled: No Mottled:  No Mottled: No Pallor: No Pallor: No Pallor: No Rubor: No Rubor: No Rubor: No Temperature: No Abnormality No Abnormality No Abnormality Tenderness on Palpation: No No No Wound Preparation: Ulcer Cleansing: Ulcer Cleansing: Ulcer Cleansing: Rinsed/Irrigated with Saline Rinsed/Irrigated with Saline Rinsed/Irrigated with Saline Topical Anesthetic Applied: Topical Anesthetic Applied: Topical Anesthetic Applied: None None None Procedures Performed: N/A Debridement N/A Wound Number: [id number]  Photos: No Photos No Photos No Photos Wound Location: Left Toe Great Left Toe Second Left Toe Third Wounding Event: Gradually Appeared Gradually Appeared Gradually Appeared Primary Etiology: Diabetic Wound/Ulcer of 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 ([id number]) Venous Disease, Type II Venous Disease, Type II Venous Disease, Type II Diabetes, Neuropathy Diabetes, Neuropathy Diabetes, Neuropathy Date Acquired: 06/11/2016 06/11/2016 12/30/2016 Weeks of Treatment: 18 18 4  Wound Status: Open Open Open Pending Amputation on Yes Yes No Presentation: Measurements L x W x D 3x6.5x0.1 0.2x0.2x0.1 0.2x0.2x0.1 (cm) Area (cm) : 15.315 0.031 0.031 Volume (cm) : 1.532 0.003 0.003 % Reduction in Area: -7.10% 98.50% 96.10% % Reduction in Volume: -7.20% 98.60% 96.30% Classification: Grade 1 Grade 1 Grade 1 Exudate Amount: Large Large Large Exudate Type: Serous Serous Serosanguineous Exudate Color: amber amber red, brown Foul Odor  After Cleansing: Yes Yes No Odor Anticipated Due to No No N/A Product Use: Wound Margin: Flat and Intact Flat and Intact Distinct, outline attached Granulation Amount:  Large (67-100%) Medium (34-66%) Large (67-100%) Granulation Quality: Pink Pink Red Necrotic Amount: Small (1-33%) Medium (34-66%) None Present (0%) Necrotic Tissue: Adherent [place] N/A Exposed Structures: Fat Layer (Subcutaneous Fat Layer (Subcutaneous N/A Tissue) Exposed: Yes Tissue) Exposed: Yes Fascia: No Fascia: No Tendon: No Tendon: No Muscle: No Muscle: No Joint: No Joint: No Bone: No Bone: No Epithelialization: Small (1-33%) Small (1-33%) None Debridement: Debridement ([id number]) N/A N/A Pre-procedure 09:43 N/A N/A Verification/Time Out Taken: Pain Control: Lidocaine 4% Topical Solution N/A N/A Tissue Debrided: Necrotic/Eschar, N/A N/A Fibrin/Slough, Subcutaneous Level: Skin/Subcutaneous Tissue N/A N/A Debridement Area (sq cm): 1 N/A N/A Instrument: Forceps, Scissors N/A N/A Bleeding: None N/A N/A Procedural Pain: 0 N/A N/A Post Procedural Pain: 0 N/A N/A Debridement Treatment Procedure was tolerated well N/A N/A Response: Post Debridement 3x6.5x0.2 N/A N/A Measurements L x W x D (cm) Post Debridement Volume: 3.063 N/A N/A (cm) Periwound Skin Texture: Excoriation: Yes Excoriation: Yes No Abnormalities Noted Induration: No Induration: No Callus: No Callus: No Crepitus: No Crepitus: No Lusk, Clevester E. ([id number]) Rash: No Rash: No Scarring: No Scarring: No Periwound Skin Moisture: Maceration: No Maceration: Yes Maceration: Yes Dry/Scaly: No Dry/Scaly: No Periwound Skin Color: Atrophie Blanche: No Atrophie Blanche: No No Abnormalities Noted Cyanosis: No Cyanosis: No Ecchymosis: No Ecchymosis: No Erythema: No Erythema: No Hemosiderin Staining: No Hemosiderin Staining: No Mottled: No Mottled: No Pallor: No Pallor: No Rubor: No Rubor: No Temperature: No Abnormality No Abnormality No Abnormality Tenderness on Palpation: No No Yes Wound Preparation: Ulcer Cleansing: Ulcer Cleansing: Ulcer Cleansing: Rinsed/Irrigated  with Saline Rinsed/Irrigated with Saline Rinsed/Irrigated with Saline Topical Anesthetic Applied: Topical Anesthetic Applied: Topical Anesthetic Applied: None None None Procedures Performed: Debridement N/A N/A Wound Number: 9 N/A N/A Photos: No Photos N/A N/A Wound Location: Right Toe Third N/A N/A Wounding Event: Gradually Appeared N/A N/A Primary Etiology: Diabetic Wound/Ulcer of the N/A N/A Lower Extremity Comorbid History: Anemia, Lymphedema, N/A N/A Congestive Heart Failure, Hypertension, Peripheral Venous Disease, Type II Diabetes, Neuropathy Date Acquired: 12/31/2016 N/A N/A Weeks of Treatment: 4 N/A N/A Wound Status: Open N/A N/A Pending Amputation on No N/A N/A Presentation: Measurements L x W x D 0.6x0.5x0.1 N/A N/A (cm) Area (cm) : 0.236 N/A N/A Volume (cm) : 0.024 N/A N/A % Reduction in Area: 39.90% N/A N/A % Reduction in Volume: 38.50% N/A N/A Classification: Grade 1 N/A N/A Exudate Amount: Large N/A N/A Exudate Type: Serous N/A N/A Exudate Color: amber N/A N/A Foul Odor After Cleansing: Yes N/A N/A Odor Anticipated Due to No N/A N/A Product Use: Wound Margin: Flat and Intact N/A N/A Granulation Amount: Medium (34-66%) N/A N/A Granulation Quality: Pink N/A N/A Necrotic Amount: Medium (34-66%) N/A N/A Necrotic Tissue: Adherent Slough N/A N/A Exposed Structures: Fascia: No N/A N/A Fat Layer (Subcutaneous [place], Caston E. ([id number]) Tissue) Exposed: No Tendon: No Muscle: No Joint: No Bone: No Epithelialization: Large (67-100%) N/A N/A Debridement: N/A N/A N/A Pain Control: N/A N/A N/A Tissue Debrided: N/A N/A N/A Level: N/A N/A N/A Debridement Area (sq cm): N/A N/A N/A Instrument: N/A N/A N/A Bleeding: N/A N/A N/A Procedural Pain: N/A N/A N/A Post Procedural Pain: N/A N/A N/A Debridement Treatment N/A N/A N/A Response: Post Debridement N/A N/A N/A Measurements L x W x D (cm) Post Debridement Volume: N/A N/A N/A (cm) Periwound Skin  Texture: Excoriation: No N/A  N/A Induration: No Callus: No Crepitus: No Rash: No Scarring: No Periwound Skin Moisture: Maceration: No N/A N/A Dry/Scaly: No Periwound Skin Color: Atrophie Blanche: No N/A N/A Cyanosis: No Ecchymosis: No Erythema: No Hemosiderin Staining: No Mottled: No Pallor: No Rubor: No Temperature: No Abnormality N/A N/A Tenderness on Palpation: No N/A N/A Wound Preparation: Ulcer Cleansing: N/A N/A Rinsed/Irrigated with Saline Topical Anesthetic Applied: None Procedures Performed: N/A N/A N/A Treatment Notes Electronic Signature(s) Signed: 01/28/2017 10:05:16 AM By: Christin Fudge MD, FACS Entered By: Christin Fudge on 01/28/2017 10:05:15 Omlor, [name] ([id number]) -------------------------------------------------------------------------------- [place] Details Patient Name: Enfield, Jhoel E. Date of Service: 01/28/2017 9:15 AM Medical Record Number: [id number] Patient Account Number: [id number] Date of Birth/Sex: 1950-05-02 (66 y.o. Male) Treating RN: Montey Hora Primary Care Treena Cosman: Myrtie Hawk Other Clinician: Referring Howard Bunte: Myrtie Hawk Treating Yesica Kemler/Extender: Frann Rider in Treatment: 3 Active Inactive ` Abuse / Safety / Falls / Self Care Management Nursing Diagnoses: Potential for falls Goals: Patient will remain injury free related to falls Date Initiated: 09/21/2016 Target Resolution Date: 11/30/2016 Goal Status: Active Interventions: Assess fall risk on admission and as needed Notes: ` Nutrition Nursing Diagnoses: Potential for alteratiion in Nutrition/Potential for imbalanced nutrition Goals: Patient/caregiver agrees to and verbalizes understanding of need to use nutritional supplements and/or vitamins as prescribed Date Initiated: 09/21/2016 Target Resolution Date: 11/30/2016 Goal Status: Active Interventions: Assess patient nutrition upon admission and as needed per  policy Notes: ` Orientation to the Wound Care Program Nursing Diagnoses: Knowledge deficit related to the wound healing center program Goals: Patient/caregiver will verbalize understanding of the [facility] Date Initiated: 09/21/2016 Target Resolution Date: 11/30/2016 Goal Status: Active Interventions: DARKMykle, [place] ([id number]) Provide education on orientation to the wound center Notes: ` Wound/Skin Impairment Nursing Diagnoses: Knowledge deficit related to smoking impact on wound healing Goals: Ulcer/skin breakdown will have a volume reduction of 30% by week 4 Date Initiated: 09/21/2016 Target Resolution Date: 11/30/2016 Goal Status: Active Ulcer/skin breakdown will have a volume reduction of 50% by week 8 Date Initiated: 09/21/2016 Target Resolution Date: 11/30/2016 Goal Status: Active Ulcer/skin breakdown will have a volume reduction of 80% by week 12 Date Initiated: 09/21/2016 Target Resolution Date: 11/30/2016 Goal Status: Active Ulcer/skin breakdown will heal within 14 weeks Date Initiated: 09/21/2016 Target Resolution Date: 11/30/2016 Goal Status: Active Interventions: Assess patient/caregiver ability to obtain necessary supplies Assess patient/caregiver ability to perform ulcer/skin care regimen upon admission and as needed Assess ulceration(s) every visit Notes: Electronic Signature(s) Signed: 01/28/2017 4:16:02 PM By: Montey Hora Entered By: Montey Hora on 01/28/2017 09:47:52 Deakins, [name] ([id number]) -------------------------------------------------------------------------------- Pain Assessment Details Patient Name: [name], [name] Date of Service: 01/28/2017 9:15 AM Medical Record Number: [id number] Patient Account Number: [id number] Date of Birth/Sex: December 04, 1950 (66 y.o. Male) Treating RN: Montey Hora Primary Care Sela Falk: Myrtie Hawk Other Clinician: Referring Laquesha Holcomb: Myrtie Hawk Treating  Roselee Tayloe/Extender: Frann Rider in Treatment: 13 Active Problems Location of Pain Severity and Description of Pain Patient Has Paino No Site Locations Pain Management and Medication Current Pain Management: Notes Topical or injectable lidocaine is offered to patient for acute pain when surgical debridement is performed. If needed, Patient is instructed to use over the counter pain medication for the following 24-48 hours after debridement. Wound care MDs do not prescribed pain medications. Patient has chronic pain or uncontrolled pain. Patient has been instructed to make an appointment with their Primary Care Physician for pain management. Electronic Signature(s) Signed: 01/28/2017 4:16:02 PM By: Montey Hora Entered By:  Montey Hora on 01/28/2017 09:24:11 [name], [name] ([id number]) -------------------------------------------------------------------------------- Patient/Caregiver Education Details Patient Name: JHONNIE, ALIANO. Date of Service: 01/28/2017 9:15 AM Medical Record Number: [id number] Patient Account Number: [id number] Date of Birth/Gender: 1950/04/26 (66 y.o. Male) Treating RN: Montey Hora Primary Care Physician: Myrtie Hawk Other Clinician: Referring Physician: Myrtie Hawk Treating Physician/Extender: Frann Rider in Treatment: 45 Education Assessment Education Provided To: Patient Education Topics Provided Wound/Skin Impairment: Handouts: Other: wound care as ordered Methods: Demonstration, Explain/Verbal Responses: State content correctly Electronic Signature(s) Signed: 01/28/2017 4:16:02 PM By: Montey Hora Entered By: Montey Hora on 01/28/2017 09:43:30 [name], [name] ([id number]) -------------------------------------------------------------------------------- Wound Assessment Details Patient Name: [name], [name] Date of Service: 01/28/2017 9:15 AM Medical Record Number: [id number] Patient Account Number:  [id number] Date of Birth/Sex: 20-Apr-1950 (66 y.o. Male) Treating RN: Montey Hora Primary Care Sevanna Ballengee: Myrtie Hawk Other Clinician: Referring Welton Bord: Myrtie Hawk Treating Allanna Bresee/Extender: Frann Rider in Treatment: 18 Wound Status Wound Number: 1 Primary Diabetic Wound/Ulcer of the Lower Extremity Etiology: Wound Location: Right Toe Great Wound Open Wounding Event: Gradually Appeared Status: Date Acquired: 06/11/2016 Comorbid Anemia, Lymphedema, Congestive Heart Weeks Of Treatment: 18 History: Failure, Hypertension, Peripheral Venous Clustered Wound: No Disease, Type II Diabetes, Neuropathy Pending Amputation On Presentation Photos Photo Uploaded By: Montey Hora on 01/28/2017 14:11:02 Wound Measurements Length: (cm) 1.6 Width: (cm) 1.8 Depth: (cm) 0.2 Area: (cm) 2.262 Volume: (cm) 0.452 % Reduction in Area: 95.2% % Reduction in Volume: 90.4% Epithelialization: Large (67-100%) Tunneling: No Undermining: No Wound Description Classification: Grade 1 Wound Margin: Flat and Intact Exudate Amount: Large Exudate Type: Serosanguineous Exudate Color: red, brown Foul Odor After Cleansing: Yes Due to Product Use: No Slough/Fibrino Yes Wound Bed Granulation Amount: Medium (34-66%) Exposed Structure Granulation Quality: Pink Fascia Exposed: No Necrotic Amount: Medium (34-66%) Fat Layer (Subcutaneous Tissue) Exposed: Yes Necrotic Quality: Adherent Slough Tendon Exposed: No Muscle Exposed: No Joint Exposed: No Bone Exposed: No Periwound Skin Texture Sturtevant, Jourdan E. ([id number]) Texture Color No Abnormalities Noted: No No Abnormalities Noted: No Callus: No Atrophie Blanche: No Crepitus: No Cyanosis: No Excoriation: Yes Ecchymosis: No Induration: No Erythema: No Rash: No Hemosiderin Staining: No Scarring: No Mottled: No Pallor: No Moisture Rubor: No No Abnormalities Noted: No Dry / Scaly: No Temperature /  Pain Maceration: Yes Temperature: No Abnormality Wound Preparation Ulcer Cleansing: Rinsed/Irrigated with Saline Topical Anesthetic Applied: None Treatment Notes Wound #1 (Right Toe Great) 1. Cleansed with: Clean wound with Normal Saline 4. Dressing Applied: Aquacel Ag 5. Secondary Dressing Applied Dry Gauze Kerlix/Conform Notes Nystatin cream, Sivercell, ABD, conform and stretch net Electronic Signature(s) Signed: 01/28/2017 4:16:02 PM By: Montey Hora Entered By: Montey Hora on 01/28/2017 09:32:41 Vold, [name] ([id number]) -------------------------------------------------------------------------------- Wound Assessment Details Patient Name: [name], [name] Date of Service: 01/28/2017 9:15 AM Medical Record Number: [id number] Patient Account Number: [id number] Date of Birth/Sex: 12/19/1950 (66 y.o. Male) Treating RN: Montey Hora Primary Care Shantoria Ellwood: Myrtie Hawk Other Clinician: Referring Kaspian Muccio: Myrtie Hawk Treating Elmus Mathes/Extender: Frann Rider in Treatment: 18 Wound Status Wound Number: 10 Primary To be determined Etiology: Wound Location: Right Toe Fourth Wound Open Wounding Event: Not Known Status: Date Acquired: 01/28/2017 Comorbid Anemia, Lymphedema, Congestive Heart Weeks Of Treatment: 0 History: Failure, Hypertension, Peripheral Venous Clustered Wound: No Disease, Type II Diabetes, Neuropathy Photos Wound Measurements Length: (cm) 0.7 % Reduction Width: (cm) 2 % Reduction Depth: (cm) 0.9 Epitheliali Area: (cm) 1.1 Tunneling: Volume: (cm) 0.99 Underminin in Area: 0% in Volume: 0% zation: None No g: No Wound Description Full  Thickness With Exposed Support Foul Odor A Classification: Structures Due to Prod Wound Margin: Flat and Intact Slough/Fibr Exudate Large Amount: Exudate Type: Serous Exudate Color: amber fter Cleansing: Yes uct Use: No ino Yes Wound Bed Granulation Amount: Small (1-33%)  Exposed Structure Granulation Quality: Red Fascia Exposed: No Necrotic Amount: Large (67-100%) Fat Layer (Subcutaneous Tissue) Exposed: Yes Necrotic Quality: Eschar, Adherent Slough Tendon Exposed: No Muscle Exposed: No Joint Exposed: No Bone Exposed: No Pollak, Zeddie E. ([id number]) Periwound Skin Texture Texture Color No Abnormalities Noted: No No Abnormalities Noted: No Callus: No Atrophie Blanche: No Crepitus: No Cyanosis: No Excoriation: No Ecchymosis: No Induration: No Erythema: No Rash: No Hemosiderin Staining: No Scarring: No Mottled: No Pallor: No Moisture Rubor: No No Abnormalities Noted: No Dry / Scaly: No Temperature / Pain Maceration: No Temperature: No Abnormality Wound Preparation Ulcer Cleansing: Rinsed/Irrigated with Saline Topical Anesthetic Applied: None Treatment Notes Wound #10 (Right Toe Fourth) 1. Cleansed with: Clean wound with Normal Saline 4. Dressing Applied: Aquacel Ag 5. Secondary Dressing Applied Dry Gauze Kerlix/Conform Notes Nystatin cream, Sivercell, ABD, conform and stretch net Electronic Signature(s) Signed: 01/28/2017 2:35:32 PM By: Montey Hora Entered By: Montey Hora on 01/28/2017 14:35:32 Hellums, [name] ([id number]) -------------------------------------------------------------------------------- Wound Assessment Details Patient Name: Rozeboom, Jencarlo E. Date of Service: 01/28/2017 9:15 AM Medical Record Number: [id number] Patient Account Number: [id number] Date of Birth/Sex: 02-May-1950 (66 y.o. Male) Treating RN: Montey Hora Primary Care Breslin Burklow: Myrtie Hawk Other Clinician: Referring Analis Distler: Myrtie Hawk Treating Danalee Flath/Extender: Frann Rider in Treatment: 18 Wound Status Wound Number: 2 Primary Diabetic Wound/Ulcer of the Lower Extremity Etiology: Wound Location: Right Toe Second Wound Open Wounding Event: Gradually Appeared Status: Date Acquired: 06/11/2016 Comorbid Anemia,  Lymphedema, Congestive Heart Weeks Of Treatment: 18 History: Failure, Hypertension, Peripheral Venous Clustered Wound: No Disease, Type II Diabetes, Neuropathy Pending Amputation On Presentation Photos Photo Uploaded By: Montey Hora on 01/28/2017 14:11:46 Wound Measurements Length: (cm) 0.6 Width: (cm) 0.7 Depth: (cm) 0.1 Area: (cm) 0.33 Volume: (cm) 0.033 % Reduction in Area: 89.3% % Reduction in Volume: 89.3% Epithelialization: Small (1-33%) Tunneling: No Undermining: No Wound Description Classification: Grade 1 Wound Margin: Flat and Intact Exudate Amount: Large Exudate Type: Serosanguineous Exudate Color: red, brown Foul Odor After Cleansing: Yes Due to Product Use: No Slough/Fibrino Yes Wound Bed Granulation Amount: Large (67-100%) Exposed Structure Granulation Quality: Pink Fascia Exposed: No Necrotic Amount: None Present (0%) Fat Layer (Subcutaneous Tissue) Exposed: Yes Tendon Exposed: No Muscle Exposed: No Joint Exposed: No Bone Exposed: No Periwound Skin Texture Ferryman, Erland E. ([id number]) Texture Color No Abnormalities Noted: No No Abnormalities Noted: No Callus: No Atrophie Blanche: No Crepitus: No Cyanosis: No Excoriation: Yes Ecchymosis: No Induration: No Erythema: No Rash: No Hemosiderin Staining: No Scarring: No Mottled: No Pallor: No Moisture Rubor: No No Abnormalities Noted: No Dry / Scaly: No Temperature / Pain Maceration: Yes Temperature: No Abnormality Wound Preparation Ulcer Cleansing: Rinsed/Irrigated with Saline Topical Anesthetic Applied: None Treatment Notes Wound #2 (Right Toe Second) 1. Cleansed with: Clean wound with Normal Saline 4. Dressing Applied: Aquacel Ag 5. Secondary Dressing Applied Dry Gauze Kerlix/Conform Notes Nystatin cream, Sivercell, ABD, conform and stretch net Electronic Signature(s) Signed: 01/28/2017 4:16:02 PM By: Montey Hora Entered By: Montey Hora on 01/28/2017 09:32:57 Grassel,  [name] ([id number]) -------------------------------------------------------------------------------- Wound Assessment Details Patient Name: [name], [name] Date of Service: 01/28/2017 9:15 AM Medical Record Number: [id number] Patient Account Number: [id number] Date of Birth/Sex: 03-31-1951 (66 y.o. Male) Treating RN: Montey Hora Primary Care Geriann Lafont: Myrtie Hawk Other  Clinician: Referring Duaine Radin: Myrtie Hawk Treating Zalyn Amend/Extender: Frann Rider in Treatment: 18 Wound Status Wound Number: 5 Primary Diabetic Wound/Ulcer of the Lower Extremity Etiology: Wound Location: Left Toe Great Wound Open Wounding Event: Gradually Appeared Status: Date Acquired: 06/11/2016 Comorbid Anemia, Lymphedema, Congestive Heart Weeks Of Treatment: 18 History: Failure, Hypertension, Peripheral Venous Clustered Wound: No Disease, Type II Diabetes, Neuropathy Pending Amputation On Presentation Photos Photo Uploaded By: Montey Hora on 01/28/2017 14:12:24 Wound Measurements Length: (cm) 3 Width: (cm) 6.5 Depth: (cm) 0.1 Area: (cm) 15.315 Volume: (cm) 1.532 % Reduction in Area: -7.1% % Reduction in Volume: -7.2% Epithelialization: Small (1-33%) Tunneling: No Undermining: No Wound Description Classification: Grade 1 Wound Margin: Flat and Intact Exudate Amount: Large Exudate Type: Serous Exudate Color: amber Foul Odor After Cleansing: Yes Due to Product Use: No Slough/Fibrino Yes Wound Bed Granulation Amount: Large (67-100%) Exposed Structure Granulation Quality: Pink Fascia Exposed: No Necrotic Amount: Small (1-33%) Fat Layer (Subcutaneous Tissue) Exposed: Yes Necrotic Quality: Adherent Slough Tendon Exposed: No Muscle Exposed: No Joint Exposed: No Bone Exposed: No Periwound Skin Texture Aldava, Arya E. ([id number]) Texture Color No Abnormalities Noted: No No Abnormalities Noted: No Callus: No Atrophie Blanche: No Crepitus: No Cyanosis:  No Excoriation: Yes Ecchymosis: No Induration: No Erythema: No Rash: No Hemosiderin Staining: No Scarring: No Mottled: No Pallor: No Moisture Rubor: No No Abnormalities Noted: No Dry / Scaly: No Temperature / Pain Maceration: No Temperature: No Abnormality Wound Preparation Ulcer Cleansing: Rinsed/Irrigated with Saline Topical Anesthetic Applied: None Treatment Notes Wound #5 (Left Toe Great) 1. Cleansed with: Clean wound with Normal Saline 4. Dressing Applied: Aquacel Ag 5. Secondary Dressing Applied Dry Gauze Kerlix/Conform Notes Nystatin cream, Sivercell, ABD, conform and stretch net Electronic Signature(s) Signed: 01/28/2017 4:16:02 PM By: Montey Hora Entered By: Montey Hora on 01/28/2017 09:33:12 Egley, [name] ([id number]) -------------------------------------------------------------------------------- Wound Assessment Details Patient Name: Vandegrift, Mitcheal E. Date of Service: 01/28/2017 9:15 AM Medical Record Number: [id number] Patient Account Number: [id number] Date of Birth/Sex: 1950-08-02 (66 y.o. Male) Treating RN: Montey Hora Primary Care Kebra Lowrimore: Myrtie Hawk Other Clinician: Referring Tirrell Buchberger: Myrtie Hawk Treating Daun Rens/Extender: Frann Rider in Treatment: 18 Wound Status Wound Number: 6 Primary Diabetic Wound/Ulcer of the Lower Extremity Etiology: Wound Location: Left Toe Second Wound Open Wounding Event: Gradually Appeared Status: Date Acquired: 06/11/2016 Comorbid Anemia, Lymphedema, Congestive Heart Weeks Of Treatment: 18 History: Failure, Hypertension, Peripheral Venous Clustered Wound: No Disease, Type II Diabetes, Neuropathy Pending Amputation On Presentation Photos Photo Uploaded By: Montey Hora on 01/28/2017 14:11:47 Wound Measurements Length: (cm) 0.2 Width: (cm) 0.2 Depth: (cm) 0.1 Area: (cm) 0.031 Volume: (cm) 0.003 % Reduction in Area: 98.5% % Reduction in Volume:  98.6% Epithelialization: Small (1-33%) Tunneling: No Undermining: No Wound Description Classification: Grade 1 Wound Margin: Flat and Intact Exudate Amount: Large Exudate Type: Serous Exudate Color: amber Foul Odor After Cleansing: Yes Due to Product Use: No Slough/Fibrino Yes Wound Bed Granulation Amount: Medium (34-66%) Exposed Structure Granulation Quality: Pink Fascia Exposed: No Necrotic Amount: Medium (34-66%) Fat Layer (Subcutaneous Tissue) Exposed: Yes Necrotic Quality: Adherent Slough Tendon Exposed: No Muscle Exposed: No Joint Exposed: No Bone Exposed: No Periwound Skin Texture Franek, Keiji E. ([id number]) Texture Color No Abnormalities Noted: No No Abnormalities Noted: No Callus: No Atrophie Blanche: No Crepitus: No Cyanosis: No Excoriation: Yes Ecchymosis: No Induration: No Erythema: No Rash: No Hemosiderin Staining: No Scarring: No Mottled: No Pallor: No Moisture Rubor: No No Abnormalities Noted: No Dry / Scaly: No Temperature / Pain Maceration: Yes Temperature: No Abnormality Wound  Preparation Ulcer Cleansing: Rinsed/Irrigated with Saline Topical Anesthetic Applied: None Treatment Notes Wound #6 (Left Toe Second) 1. Cleansed with: Clean wound with Normal Saline 4. Dressing Applied: Aquacel Ag 5. Secondary Dressing Applied Dry Gauze Kerlix/Conform Notes Nystatin cream, Sivercell, ABD, conform and stretch net Electronic Signature(s) Signed: 01/28/2017 4:16:02 PM By: Montey Hora Entered By: Montey Hora on 01/28/2017 09:33:27 Goers, Arvid E. ([id number]) -------------------------------------------------------------------------------- Wound Assessment Details Patient Name: Sherburn, Zackari E. Date of Service: 01/28/2017 9:15 AM Medical Record Number: [id number] Patient Account Number: [id number] Date of Birth/Sex: 1950/10/18 (66 y.o. Male) Treating RN: Montey Hora Primary Care Adisa Vigeant: Myrtie Hawk Other  Clinician: Referring Yakelin Grenier: Myrtie Hawk Treating Dagoberto Nealy/Extender: Frann Rider in Treatment: 18 Wound Status Wound Number: 8 Primary Diabetic Wound/Ulcer of the Lower Extremity Etiology: Wound Location: Left Toe Third Wound Open Wounding Event: Gradually Appeared Status: Date Acquired: 12/30/2016 Comorbid Anemia, Lymphedema, Congestive Heart Weeks Of Treatment: 4 History: Failure, Hypertension, Peripheral Venous Clustered Wound: No Disease, Type II Diabetes, Neuropathy Photos Photo Uploaded By: Montey Hora on 01/28/2017 14:13:02 Wound Measurements Length: (cm) 0.2 Width: (cm) 0.2 Depth: (cm) 0.1 Area: (cm) 0.031 Volume: (cm) 0.003 % Reduction in Area: 96.1% % Reduction in Volume: 96.3% Epithelialization: None Tunneling: No Undermining: No Wound Description Classification: Grade 1 Wound Margin: Distinct, outline attached Exudate Amount: Large Exudate Type: Serosanguineous Exudate Color: red, brown Foul Odor After Cleansing: No Slough/Fibrino Yes Wound Bed Granulation Amount: Large (67-100%) Granulation Quality: Red Necrotic Amount: None Present (0%) Periwound Skin Texture Texture Color No Abnormalities Noted: No No Abnormalities Noted: No Moisture Temperature / Pain No Abnormalities Noted: No Temperature: No Abnormality Bocchino, Conal E. ([id number]) Maceration: Yes Tenderness on Palpation: Yes Wound Preparation Ulcer Cleansing: Rinsed/Irrigated with Saline Topical Anesthetic Applied: None Treatment Notes Wound #8 (Left Toe Third) 1. Cleansed with: Clean wound with Normal Saline 4. Dressing Applied: Aquacel Ag 5. Secondary Dressing Applied Dry Gauze Kerlix/Conform Notes Nystatin cream, Sivercell, ABD, conform and stretch net Electronic Signature(s) Signed: 01/28/2017 4:16:02 PM By: Montey Hora Entered By: Montey Hora on 01/28/2017 09:33:44 Furgerson, [name]  ([id number]) -------------------------------------------------------------------------------- Wound Assessment Details Patient Name: Barcia, Vikram E. Date of Service: 01/28/2017 9:15 AM Medical Record Number: [id number] Patient Account Number: [id number] Date of Birth/Sex: [birth year] (66 y.o. Male) Treating RN: Montey Hora Primary Care Arrielle Mcginn: Myrtie Hawk Other Clinician: Referring Maanasa Aderhold: Myrtie Hawk Treating Aquanetta Schwarz/Extender: Frann Rider in Treatment: 18 Wound Status Wound Number: 9 Primary Diabetic Wound/Ulcer of the Lower Extremity Etiology: Wound Location: Right Toe Third Wound Open Wounding Event: Gradually Appeared Status: Date Acquired: 12/31/2016 Comorbid Anemia, Lymphedema, Congestive Heart Weeks Of Treatment: 4 History: Failure, Hypertension, Peripheral Venous Clustered Wound: No Disease, Type II Diabetes, Neuropathy Photos Photo Uploaded By: Montey Hora on 01/28/2017 14:13:03 Wound Measurements Length: (cm) 0.6 Width: (cm) 0.5 Depth: (cm) 0.1 Area: (cm) 0.236 Volume: (cm) 0.024 % Reduction in Area: 39.9% % Reduction in Volume: 38.5% Epithelialization: Large (67-100%) Tunneling: No Undermining: No Wound Description Classification: Grade 1 Wound Margin: Flat and Intact Exudate Amount: Large Exudate Type: Serous Exudate Color: amber Foul Odor After Cleansing: Yes Due to Product Use: No Slough/Fibrino Yes Wound Bed Granulation Amount: Medium (34-66%) Exposed Structure Granulation Quality: Pink Fascia Exposed: No Necrotic Amount: Medium (34-66%) Fat Layer (Subcutaneous Tissue) Exposed: No Necrotic Quality: Adherent Slough Tendon Exposed: No Muscle Exposed: No Joint Exposed: No Bone Exposed: No Periwound Skin Texture Salman, Kennard E. ([id number]) Texture Color No Abnormalities Noted: No No Abnormalities Noted: No Callus: No Atrophie Blanche: No Crepitus: No Cyanosis: No Excoriation: No  Ecchymosis:  No Induration: No Erythema: No Rash: No Hemosiderin Staining: No Scarring: No Mottled: No Pallor: No Moisture Rubor: No No Abnormalities Noted: No Dry / Scaly: No Temperature / Pain Maceration: No Temperature: No Abnormality Wound Preparation Ulcer Cleansing: Rinsed/Irrigated with Saline Topical Anesthetic Applied: None Treatment Notes Wound #9 (Right Toe Third) 1. Cleansed with: Clean wound with Normal Saline 4. Dressing Applied: Aquacel Ag 5. Secondary Dressing Applied Dry Gauze Kerlix/Conform Notes Nystatin cream, Sivercell, ABD, conform and stretch net Electronic Signature(s) Signed: 01/28/2017 4:16:02 PM By: Montey Hora Entered By: Montey Hora on 01/28/2017 09:34:14 Feehan, [name] ([id number]) -------------------------------------------------------------------------------- Vitals Details Patient Name: Mozingo, Rocio E. Date of Service: 01/28/2017 9:15 AM Medical Record Number: [id number] Patient Account Number: [id number] Date of Birth/Sex: 25-Mar-1951 (66 y.o. Male) Treating RN: Montey Hora Primary Care Ashonte Angelucci: Myrtie Hawk Other Clinician: Referring Skeeter Sheard: Myrtie Hawk Treating Lavonia Eager/Extender: Frann Rider in Treatment: 18 Vital Signs Time Taken: 09:25 Temperature (F): 97.8 Height (in): 69 Pulse (bpm): 66 Weight (lbs): 168 Respiratory Rate (breaths/min): 18 Body Mass Index (BMI): 24.8 Blood Pressure (mmHg): 163/96 Reference Range: 80 - 120 mg / dl Electronic Signature(s) Signed: 01/28/2017 4:16:02 PM By: Montey Hora Entered By: Montey Hora on 01/28/2017 53:91:22

## 2017-02-01 ENCOUNTER — Other Ambulatory Visit: Payer: Self-pay | Admitting: *Deleted

## 2017-02-04 ENCOUNTER — Ambulatory Visit
Admission: RE | Admit: 2017-02-04 | Discharge: 2017-02-04 | Disposition: A | Discharge status: Home / Self Care | Payer: Medicare HMO | Source: Ambulatory Visit | Attending: Oncology | Admitting: Oncology

## 2017-02-04 ENCOUNTER — Inpatient Hospital Stay: Payer: Medicare HMO | Attending: Oncology

## 2017-02-04 ENCOUNTER — Other Ambulatory Visit: Payer: Self-pay | Admitting: *Deleted

## 2017-02-04 DIAGNOSIS — N189 Chronic kidney disease, unspecified: Secondary | ICD-10-CM | POA: Insufficient documentation

## 2017-02-04 DIAGNOSIS — D472 Monoclonal gammopathy: Secondary | ICD-10-CM | POA: Insufficient documentation

## 2017-02-04 DIAGNOSIS — R590 Localized enlarged lymph nodes: Secondary | ICD-10-CM | POA: Insufficient documentation

## 2017-02-04 DIAGNOSIS — R935 Abnormal findings on diagnostic imaging of other abdominal regions, including retroperitoneum: Secondary | ICD-10-CM

## 2017-02-04 DIAGNOSIS — I509 Heart failure, unspecified: Secondary | ICD-10-CM | POA: Insufficient documentation

## 2017-02-04 DIAGNOSIS — K219 Gastro-esophageal reflux disease without esophagitis: Secondary | ICD-10-CM | POA: Insufficient documentation

## 2017-02-04 DIAGNOSIS — Z79899 Other long term (current) drug therapy: Secondary | ICD-10-CM | POA: Diagnosis not present

## 2017-02-04 DIAGNOSIS — D649 Anemia, unspecified: Secondary | ICD-10-CM

## 2017-02-04 DIAGNOSIS — M47816 Spondylosis without myelopathy or radiculopathy, lumbar region: Secondary | ICD-10-CM | POA: Insufficient documentation

## 2017-02-04 DIAGNOSIS — E1122 Type 2 diabetes mellitus with diabetic chronic kidney disease: Secondary | ICD-10-CM | POA: Diagnosis not present

## 2017-02-04 DIAGNOSIS — I130 Hypertensive heart and chronic kidney disease with heart failure and stage 1 through stage 4 chronic kidney disease, or unspecified chronic kidney disease: Secondary | ICD-10-CM | POA: Insufficient documentation

## 2017-02-04 DIAGNOSIS — M5137 Other intervertebral disc degeneration, lumbosacral region: Secondary | ICD-10-CM | POA: Insufficient documentation

## 2017-02-04 DIAGNOSIS — Z7982 Long term (current) use of aspirin: Secondary | ICD-10-CM | POA: Diagnosis not present

## 2017-02-04 DIAGNOSIS — Z85820 Personal history of malignant melanoma of skin: Secondary | ICD-10-CM | POA: Diagnosis not present

## 2017-02-04 DIAGNOSIS — D509 Iron deficiency anemia, unspecified: Secondary | ICD-10-CM | POA: Insufficient documentation

## 2017-02-04 DIAGNOSIS — F17210 Nicotine dependence, cigarettes, uncomplicated: Secondary | ICD-10-CM | POA: Insufficient documentation

## 2017-02-04 LAB — CBC WITH DIFFERENTIAL/PLATELET
Basophils Absolute: 0.1 10*3/uL (ref 0–0.1)
Basophils Relative: 1 %
EOS PCT: 7 %
Eosinophils Absolute: 0.5 10*3/uL (ref 0–0.7)
HCT: 23.8 % — ABNORMAL LOW (ref 40.0–52.0)
Hemoglobin: 8 g/dL — ABNORMAL LOW (ref 13.0–18.0)
LYMPHS ABS: 1 10*3/uL (ref 1.0–3.6)
Lymphocytes Relative: 13 %
MCH: 29.8 pg (ref 26.0–34.0)
MCHC: 33.4 g/dL (ref 32.0–36.0)
MCV: 89.2 fL (ref 80.0–100.0)
Monocytes Absolute: 0.8 10*3/uL (ref 0.2–1.0)
Monocytes Relative: 12 %
Neutro Abs: 4.8 10*3/uL (ref 1.4–6.5)
Neutrophils Relative %: 67 %
PLATELETS: 233 10*3/uL (ref 150–440)
RBC: 2.67 MIL/uL — AB (ref 4.40–5.90)
RDW: 13 % (ref 11.5–14.5)
WBC: 7.1 10*3/uL (ref 3.8–10.6)

## 2017-02-04 LAB — COMPREHENSIVE METABOLIC PANEL
ALT: 23 U/L (ref 17–63)
ANION GAP: 7 (ref 5–15)
AST: 34 U/L (ref 15–41)
Albumin: 2.9 g/dL — ABNORMAL LOW (ref 3.5–5.0)
Alkaline Phosphatase: 102 U/L (ref 38–126)
BUN: 40 mg/dL — ABNORMAL HIGH (ref 6–20)
CHLORIDE: 103 mmol/L (ref 101–111)
CO2: 20 mmol/L — AB (ref 22–32)
Calcium: 8.4 mg/dL — ABNORMAL LOW (ref 8.9–10.3)
Creatinine, Ser: 2.43 mg/dL — ABNORMAL HIGH (ref 0.61–1.24)
GFR calc non Af Amer: 27 mL/min — ABNORMAL LOW (ref >60)
GFR, EST AFRICAN AMERICAN: 31 mL/min — AB (ref >60)
Glucose, Bld: 126 mg/dL — ABNORMAL HIGH (ref 65–99)
POTASSIUM: 5 mmol/L (ref 3.5–5.1)
SODIUM: 130 mmol/L — AB (ref 135–145)
Total Bilirubin: 0.6 mg/dL (ref 0.3–1.2)
Total Protein: 7.9 g/dL (ref 6.5–8.1)

## 2017-02-05 ENCOUNTER — Telehealth: Payer: Self-pay | Admitting: *Deleted

## 2017-02-05 NOTE — Telephone Encounter (Signed)
I got message and I called the nephrology office and his levels has been more elevated than in out office this past week. I gave values to Dr. Janese Banks and pt has an appt with nephrology in dec.

## 2017-02-05 NOTE — Telephone Encounter (Signed)
-----   Message from Reeves Dam sent at 02/05/2017  9:39 AM EDT ----- His kidney doctor is Murlean Iba so you can get results

## 2017-02-06 ENCOUNTER — Telehealth: Payer: Self-pay | Admitting: Oncology

## 2017-02-06 NOTE — Telephone Encounter (Signed)
Lab & Lupron Inj to 02/19/17 appt, per 02/06/17 schd msg/Sherry. MF

## 2017-02-07 ENCOUNTER — Encounter: Payer: Medicare HMO | Admitting: Surgery

## 2017-02-07 DIAGNOSIS — L97512 Non-pressure chronic ulcer of other part of right foot with fat layer exposed: Secondary | ICD-10-CM | POA: Insufficient documentation

## 2017-02-07 DIAGNOSIS — L97522 Non-pressure chronic ulcer of other part of left foot with fat layer exposed: Secondary | ICD-10-CM | POA: Diagnosis not present

## 2017-02-07 DIAGNOSIS — F17218 Nicotine dependence, cigarettes, with other nicotine-induced disorders: Secondary | ICD-10-CM | POA: Insufficient documentation

## 2017-02-07 DIAGNOSIS — I87313 Chronic venous hypertension (idiopathic) with ulcer of bilateral lower extremity: Secondary | ICD-10-CM | POA: Diagnosis not present

## 2017-02-07 DIAGNOSIS — Z7984 Long term (current) use of oral hypoglycemic drugs: Secondary | ICD-10-CM | POA: Diagnosis not present

## 2017-02-07 DIAGNOSIS — E1140 Type 2 diabetes mellitus with diabetic neuropathy, unspecified: Secondary | ICD-10-CM | POA: Diagnosis not present

## 2017-02-07 DIAGNOSIS — I890 Lymphedema, not elsewhere classified: Secondary | ICD-10-CM | POA: Diagnosis not present

## 2017-02-07 DIAGNOSIS — K7460 Unspecified cirrhosis of liver: Secondary | ICD-10-CM | POA: Insufficient documentation

## 2017-02-07 DIAGNOSIS — E11621 Type 2 diabetes mellitus with foot ulcer: Secondary | ICD-10-CM | POA: Diagnosis present

## 2017-02-07 DIAGNOSIS — Z923 Personal history of irradiation: Secondary | ICD-10-CM | POA: Insufficient documentation

## 2017-02-07 DIAGNOSIS — E1151 Type 2 diabetes mellitus with diabetic peripheral angiopathy without gangrene: Secondary | ICD-10-CM | POA: Insufficient documentation

## 2017-02-07 DIAGNOSIS — F1019 Alcohol abuse with unspecified alcohol-induced disorder: Secondary | ICD-10-CM | POA: Diagnosis not present

## 2017-02-07 DIAGNOSIS — C44529 Squamous cell carcinoma of skin of other part of trunk: Secondary | ICD-10-CM | POA: Insufficient documentation

## 2017-02-07 DIAGNOSIS — I110 Hypertensive heart disease with heart failure: Secondary | ICD-10-CM | POA: Diagnosis not present

## 2017-02-07 DIAGNOSIS — I509 Heart failure, unspecified: Secondary | ICD-10-CM | POA: Diagnosis not present

## 2017-02-08 ENCOUNTER — Other Ambulatory Visit: Payer: Self-pay | Admitting: Surgery

## 2017-02-08 ENCOUNTER — Other Ambulatory Visit: Payer: Self-pay | Admitting: Oncology

## 2017-02-08 DIAGNOSIS — L97919 Non-pressure chronic ulcer of unspecified part of right lower leg with unspecified severity: Principal | ICD-10-CM

## 2017-02-08 DIAGNOSIS — L97929 Non-pressure chronic ulcer of unspecified part of left lower leg with unspecified severity: Principal | ICD-10-CM

## 2017-02-08 DIAGNOSIS — IMO0001 Reserved for inherently not codable concepts without codable children: Secondary | ICD-10-CM

## 2017-02-08 DIAGNOSIS — I87313 Chronic venous hypertension (idiopathic) with ulcer of bilateral lower extremity: Principal | ICD-10-CM

## 2017-02-08 DIAGNOSIS — L97512 Non-pressure chronic ulcer of other part of right foot with fat layer exposed: Secondary | ICD-10-CM

## 2017-02-08 NOTE — Progress Notes (Addendum)
DELONTE, MUSICH ([id number]) Visit Report for 02/07/2017 Chief Complaint Document Details Patient Name: [name], [name] Date of Service: 02/07/2017 1:30 PM Medical Record Number: [id number] Patient Account Number: [id number] Date of Birth/Sex: 15-Jun-1950 (66 y.o. Male) Treating RN: Montey Hora Primary Care Provider: Myrtie Hawk Other Clinician: Referring Provider: Myrtie Hawk Treating Provider/Extender: Frann Rider in Treatment: 39 Information Obtained from: Patient Chief Complaint Patients presents for treatment of an open diabetic ulcer to both feet Electronic Signature(s) Signed: 02/07/2017 3:02:09 PM By: Christin Fudge MD, FACS Entered By: Christin Fudge on 02/07/2017 15:02:08 Mault, [name] ([id number]) -------------------------------------------------------------------------------- HPI Details Patient Name: [name], [name] Date of Service: 02/07/2017 1:30 PM Medical Record Number: [id number] Patient Account Number: [id number] Date of Birth/Sex: 08-24-1950 (66 y.o. Male) Treating RN: Montey Hora Primary Care Provider: Myrtie Hawk Other Clinician: Referring Provider: Myrtie Hawk Treating Provider/Extender: Frann Rider in Treatment: 19 History of Present Illness Location: bilateral feet ulceration on the toes Quality: Patient reports experiencing a dull pain to affected area(s). Severity: Patient states wound are getting better Duration: Patient has had the wound for > 3 months prior to seeking treatment at the wound center Timing: Pain in wound is constant (hurts all the time) Context: The wound would happen gradually Modifying Factors: Other treatment(s) tried include:treatment for lymphedema and is seen by the podiatrist Dr. Caryl Comes Associated Signs and Symptoms: Patient reports having increase swelling. HPI Description: 66 year old patient here to see as for bilateral feet ulceration to on his left first and second toe  and 2 on his right first and second toe, which she's had for about 4 months. 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 Donella Stade. The patient is also undergoing workup by medical oncology for a intra-abdominal lymphadenopathy. 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 He is also status post appendectomy, inguinal lymph node biopsy, rectal biopsy and rectal examination under anesthesia. he currently smokes cigarettes about half packet a day. In March of this year he was seen by Dr. Hortencia Pilar, for evaluation of bilateral varicose veins and besides wearing compression stockings he had recommended laser ablation of the right and left great saphenous veins to eleviate the symptoms and complications of severe superficial venous reflux disease. He also recommended lymphedema pumps for better control of his lymphedema. The patient recently has had on 08/23/2016, right greater saphenous vein ablation with the laser energy Earlier lower extremity venous reflux examination done on 05/08/2016 showed no DVT or SVT both lower legs but incompetence of bilateral great saphenous veins was present. A lower arterial study was also done and there was no significant right lower and left lower extremity problems based on a normal toe brachial index bilaterally and the ABI was 1.21 the left and 1.23 on the right. His post ablation venous duplex examination showed successful ablation of the right GS vein with thrombus formation 2 below the right saphenofemoral junction. The deep system was patent without evidence of thrombosis and this was done on 08/30/2016. the patient also has a squamous cell cancer of the skin of the buttock and is recently undergone radiation  therapy for this prior to excisional surgery. Addendum: regarding his x-rays done today and x-ray of the left foot -- IMPRESSION: No objective evidence of osteomyelitis. There are soft tissue  changes which may reflect cellulitis. X-ray of the right foot -- IMPRESSION:Findings compatible with cellulitis of the toes. No objective evidence of osteomyelitis is observed. 10/01/16 on evaluation today patient's wounds appeared to be doing some better. I did review the x-rays as well which showed no evidence of osteomyelitis although there was evidence on x-ray of cellulitis. He fortunately is not having any discomfort although he continues to have some swelling. He does not remember being on any antibiotics recently. 10/15/16 on evaluation today patient's wounds overall appear to be doing better although he does have a new location noted on the left foot. Fortunately he is not having significant pain. It almost has the appearance that something is rubbing on the end of his toes but he wears the open toe shoes and according to what he is telling me never wears anything that would rub on his foot. There is no evidence of infection and specifically no evidence of a fungal infection 10/22/16 On evaluation today patient's wounds appeared to be doing better compared to last week in regard to his bilateral [name], [name] ([id number]) lower extremities. Fortunately I happy with how things are progressing although he still has ulcers I feel like that he is improving and appropriate manner. 11/12/16 on evaluation today patient appears to be doing well in regard to his bilateral feet and the respective wounds. We have been using surrounding her dressings along with an antifungal cream which seems to be doing very well. He has no bilateral dysfunction noticed that the rituals are weight loss at this point. He also has no nausea or vomiting a note purulent discharge. He did see Vein and vascular today and he tells me that  they told him he could have surgery for his venous stasis but they did not feel like it was worth it in his words. Fortunately patient's wounds do appear to be getting sneakily better. 11/26/2016 -- he says he is going to have some surgery during this week at [facility] for possibly a colon resection. 12/31/2016 -- the patient has been noncompliant with his smoking and I'm not sure whether he is also started drinking again. He continues to be very nonchalant about his care 01/14/2017 -- the patient's HandP has been reviewed well and I understand he is being compliant with trying to give up smoking and his local dressing changes. He does not have any surgical options of 4 to him by his vascular surgeons.he was last seen in early August by Dr. Hortencia Pilar who recommended compression stockings,and possibly lymph pumps in 2-3 months after doing a review ultrasound. 01/28/2017 - the patient did not have any fresh complaints but on examination I noted a large lacerated wound on the plantar aspect of his right fourth toe which had a lot of necrotic debris and it probes down to bone. 02/07/2017 -- x-ray of the right foot -- IMPRESSION: Soft tissue swelling about the first through fourth toes consistent with cellulitis. New destructive change in the tuft of the distal phalanx of the great toe is consistent with osteomyelitis. Electronic Signature(s) Signed: 02/07/2017 3:02:23 PM By: Christin Fudge MD, FACS Previous Signature: 02/07/2017 2:23:56 PM Version By: Christin Fudge MD, FACS Entered By: Christin Fudge on 02/07/2017 15:02:22 [name], [name] ([id number]) -------------------------------------------------------------------------------- Physical Exam Details Patient Name: Balbi, Keelyn [name]. Date of Service: 02/07/2017 1:30 PM Medical Record Number: [id number] Patient Account Number: [id number] Date of Birth/Sex: [birth year] (66 y.o. Male) Treating RN: Montey Hora Primary Care Provider:  Myrtie Hawk Other Clinician:  Referring Provider: Myrtie Hawk Treating Provider/Extender: Frann Rider in Treatment: 19 Constitutional . Pulse regular. Respirations normal and unlabored. Afebrile. . Eyes Nonicteric. Reactive to light. Ears, Nose, Mouth, and Throat Lips, teeth, and gums WNL.Marland Kitchen Moist mucosa without lesions. Neck supple and nontender. No palpable supraclavicular or cervical adenopathy. Normal sized without goiter. Respiratory WNL. No retractions.. Cardiovascular Pedal Pulses WNL. No clubbing, cyanosis or edema. Lymphatic No adneopathy. No adenopathy. No adenopathy. Musculoskeletal Adexa without tenderness or enlargement.. Digits and nails w/o clubbing, cyanosis, infection, petechiae, ischemia, or inflammatory conditions.. Integumentary (Hair, Skin) No suspicious lesions. No crepitus or fluctuance. No peri-wound warmth or erythema. No masses.Marland Kitchen Psychiatric Judgement and insight Intact.. No evidence of depression, anxiety, or agitation.. Notes right fourth toe continues to have minimal debris and I washed this out with moist saline gauze and it does probe down to bone. After doing the x-ray the right big toe is of concern regarding osteomyelitis but this does not have any wound opening to the bone. The wounds on the left leg are looking about the same Electronic Signature(s) Signed: 02/07/2017 3:03:47 PM By: Christin Fudge MD, FACS Entered By: Christin Fudge on 02/07/2017 15:03:46 [name], [name] ([id number]) -------------------------------------------------------------------------------- Physician Orders Details Patient Name: Muriel, Ruslan [name]. Date of Service: 02/07/2017 1:30 PM Medical Record Number: [id number] Patient Account Number: [id number] Date of Birth/Sex: [birth year] (66 y.o. Male) Treating RN: Montey Hora Primary Care Provider: Myrtie Hawk Other Clinician: Referring Provider: Myrtie Hawk Treating  Provider/Extender: Frann Rider in Treatment: 43 Verbal / Phone Orders: No Diagnosis Coding Wound Cleansing Wound #1 Right Toe Great o Clean wound with Normal Saline. o May Shower, gently pat wound dry prior to applying new dressing. Wound #10 Right Toe Fourth o Clean wound with Normal Saline. o May Shower, gently pat wound dry prior to applying new dressing. Wound #2 Right Toe Second o Clean wound with Normal Saline. o May Shower, gently pat wound dry prior to applying new dressing. Wound #5 Left Toe Great o Clean wound with Normal Saline. o May Shower, gently pat wound dry prior to applying new dressing. Wound #6 Left Toe Second o Clean wound with Normal Saline. o May Shower, gently pat wound dry prior to applying new dressing. Wound #8 Left Toe Third o Clean wound with Normal Saline. o May Shower, gently pat wound dry prior to applying new dressing. Wound #9 Right Toe Third o Clean wound with Normal Saline. o May Shower, gently pat wound dry prior to applying new dressing. Anesthetic Wound #1 Right Toe Great o Topical Lidocaine 4% cream applied to wound bed prior to debridement Wound #10 Right Toe Fourth o Topical Lidocaine 4% cream applied to wound bed prior to debridement Wound #2 Right Toe Second o Topical Lidocaine 4% cream applied to wound bed prior to debridement Wound #5 Left Toe Great o Topical Lidocaine 4% cream applied to wound bed prior to debridement Wound #6 Left Toe Second o Topical Lidocaine 4% cream applied to wound bed prior to debridement Fifita, Draken [name]. ([id number]) Wound #8 Left Toe Third o Topical Lidocaine 4% cream applied to wound bed prior to debridement Wound #9 Right Toe Third o Topical Lidocaine 4% cream applied to wound bed prior to debridement Primary Wound Dressing Wound #1 Right Toe Great o Silvercel Non-Adherent o Other: - nystatin Wound #10 Right Toe Fourth o Silvercel  Non-Adherent o Other: - nystatin Wound #2 Right Toe Second o Silvercel Non-Adherent o Other: - nystatin Wound #5 Left Toe Great o Silvercel Non-Adherent   o Other: - nystatin Wound #6 Left Toe Second o Silvercel Non-Adherent o Other: - nystatin Wound #8 Left Toe Third o Silvercel Non-Adherent o Other: - nystatin Wound #9 Right Toe Third o Silvercel Non-Adherent o Other: - nystatin Secondary Dressing Wound #1 Right Toe Great o Gauze and Kerlix/Conform Wound #10 Right Toe Fourth o Gauze and Kerlix/Conform Wound #2 Right Toe Second o Gauze and Kerlix/Conform Wound #5 Left Toe Great o Gauze and Kerlix/Conform Wound #6 Left Toe Second o Gauze and Kerlix/Conform Wound #8 Left Toe Third o Gauze and Kerlix/Conform Wound #9 Right Toe Third o Gauze and Kerlix/Conform [name], [name] ([id number]) Dressing Change Frequency Wound #1 Right Toe Great o Change dressing every day. Wound #10 Right Toe Fourth o Change dressing every day. Wound #2 Right Toe Second o Change dressing every day. Wound #5 Left Toe Great o Change dressing every day. Wound #6 Left Toe Second o Change dressing every day. Wound #8 Left Toe Third o Change dressing every day. Wound #9 Right Toe Third o Change dressing every day. Follow-up Appointments Wound #1 Right Toe Great o Return Appointment in 1 week. Wound #10 Right Toe Fourth o Return Appointment in 1 week. Wound #2 Right Toe Second o Return Appointment in 1 week. Wound #5 Left Toe Great o Return Appointment in 1 week. Wound #6 Left Toe Second o Return Appointment in 1 week. Wound #8 Left Toe Third o Return Appointment in 1 week. Wound #9 Right Toe Third o Return Appointment in 1 week. Additional Orders / Instructions Wound #1 Right Toe Great o Stop Smoking o Increase protein intake. o Other: - Please add vitamin A, vitamin C and zinc supplements to your diet Wound #10  Right Toe Fourth o Stop Smoking o Increase protein intake. o Other: - Please add vitamin A, vitamin C and zinc supplements to your diet [name], [name] ([id number]) Wound #2 Right Toe Second o Stop Smoking o Increase protein intake. o Other: - Please add vitamin A, vitamin C and zinc supplements to your diet Wound #5 Left Toe Great o Stop Smoking o Increase protein intake. o Other: - Please add vitamin A, vitamin C and zinc supplements to your diet Wound #6 Left Toe Second o Stop Smoking o Increase protein intake. o Other: - Please add vitamin A, vitamin C and zinc supplements to your diet Wound #8 Left Toe Third o Stop Smoking o Increase protein intake. o Other: - Please add vitamin A, vitamin C and zinc supplements to your diet Wound #9 Right Toe Third o Stop Smoking o Increase protein intake. o Other: - Please add vitamin A, vitamin C and zinc supplements to your diet Medications-please add to medication list. Wound #10 Right Toe Fourth o P.O. Antibiotics - doxycycline Radiology o Magnetic Resonance Imaging (MRI) - right foot Electronic Signature(s) Signed: 02/07/2017 4:34:23 PM By: Christin Fudge MD, FACS Signed: 02/07/2017 4:49:35 PM By: Montey Hora Entered By: Montey Hora on 02/07/2017 14:33:01 Sherwood, [name] ([id number]) -------------------------------------------------------------------------------- Problem List Details Patient Name: Hartig, Devondre [name]. Date of Service: 02/07/2017 1:30 PM Medical Record Number: [id number] Patient Account Number: [id number] Date of Birth/Sex: 1950/12/07 (66 y.o. 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 09/21/2016 Yes exposed L97.512 Non-pressure chronic ulcer of other part of right foot with fat layer 09/21/2016 Yes exposed F17.218 Nicotine dependence, cigarettes, with other nicotine-induced 09/21/2016 Yes disorders F10.19 Alcohol abuse with unspecified alcohol-induced disorder 09/21/2016 Yes Inactive Problems Resolved Problems Electronic Signature(s) Signed: 02/07/2017 3:01:50 PM By: Christin Fudge MD, FACS Entered By: Christin Fudge on 02/07/2017 15:01:48 [name], [name] ([id number]) -------------------------------------------------------------------------------- Progress Note Details Patient Name: [name], [name] Date of Service: 02/07/2017 1:30 PM Medical Record Number: [id number] Patient Account Number: [id number] Date of Birth/Sex: Jul 17, 1950 (66 y.o. Male) Treating RN: Montey Hora Primary Care Provider: Myrtie Hawk Other Clinician: Referring Provider: Myrtie Hawk Treating Provider/Extender: Frann Rider in Treatment: 37 Subjective Chief Complaint Information obtained from Patient Patients presents for treatment of an open diabetic ulcer to both feet History of Present Illness (HPI) The following HPI elements were documented for the patient's wound: Location: bilateral feet ulceration on the toes Quality: Patient reports experiencing a dull pain to affected area(s). Severity: Patient states wound are getting better Duration: Patient has had the wound for > 3 months prior to seeking treatment at the wound center Timing: Pain in wound is constant (hurts all the time) Context: The wound would happen gradually Modifying Factors: Other treatment(s) tried include:treatment for lymphedema and is seen by the podiatrist  Dr. Caryl Comes Associated Signs and Symptoms: Patient reports having increase swelling. 66 year old patient here to see as for bilateral feet ulceration to on his left first and second toe and 2 on his right first and second toe, which she's had for about 4 months. 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 Donella Stade. The patient is also undergoing workup by medical oncology for a intra-abdominal lymphadenopathy. 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 He is also status post appendectomy, inguinal lymph node biopsy, rectal biopsy and rectal examination under anesthesia. he currently smokes cigarettes about half packet a day. In March of this year he was seen by Dr. Hortencia Pilar, for evaluation of bilateral varicose veins and besides wearing compression stockings he had recommended laser ablation of the right and left great saphenous veins to eleviate the symptoms and complications of severe superficial venous reflux disease. He also recommended lymphedema pumps for better control of his lymphedema. The patient recently has had on 08/23/2016, right greater saphenous vein ablation with the laser energy Earlier lower extremity venous reflux examination done on 05/08/2016 showed no DVT or SVT both lower legs but incompetence of bilateral great saphenous veins was present. A lower arterial study was also done and there was no significant right lower and left lower extremity problems based on a normal toe brachial index bilaterally and the ABI was 1.21 the left and 1.23 on the right. His post ablation venous duplex examination showed successful ablation of the right GS vein with thrombus formation 2 below the right saphenofemoral junction. The deep system  was patent without evidence of thrombosis and this was done on 08/30/2016. 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 Addendum: regarding his x-rays done today and x-ray of the left foot --  IMPRESSION: No objective evidence of osteomyelitis. There are soft tissue changes which may reflect cellulitis. X-ray of the right foot -- IMPRESSION:Findings compatible with cellulitis of the toes. No objective evidence of osteomyelitis is observed. 10/01/16 on evaluation today patient's wounds appeared to be doing some better. I did review the x-rays as well which showed no evidence of osteomyelitis although there was evidence on x-ray of cellulitis. He fortunately is not having any discomfort Lieser, Braylan [name]. ([id number]) although he continues to have some swelling. He does not remember being on any antibiotics recently. 10/15/16 on evaluation today patient's wounds overall appear to be doing better although he does have a new location noted on the left foot. Fortunately he is not having significant pain. It almost has the appearance that something is rubbing on the end of his toes but he wears the open toe shoes and according to what he is telling me never wears anything that would rub on his foot. There is no evidence of infection and specifically no evidence of a fungal infection 10/22/16 On evaluation today patient's wounds appeared to be doing better compared to last week in regard to his bilateral lower extremities. Fortunately I happy with how things are progressing although he still has ulcers I feel like that he is improving and appropriate manner. 11/12/16 on evaluation today patient appears to be doing well in regard to his bilateral feet and the respective wounds. We have been using surrounding her dressings along with an antifungal cream which seems to be doing very well. He has no  bilateral dysfunction noticed that the rituals are weight loss at this point. He also has no nausea or vomiting a note purulent discharge. He did see Vein and vascular today and he tells me that they told him he could have surgery for his venous stasis but they did not feel like it was worth it in his words. Fortunately patient's wounds do appear to be getting sneakily better. 11/26/2016 -- he says he is going to have some surgery during this week at [facility] for possibly a colon resection. 12/31/2016 -- the patient has been noncompliant with his smoking and I'm not sure whether he is also started drinking again. He continues to be very nonchalant about his care 01/14/2017 -- the patient's HandP has been reviewed well and I understand he is being compliant with trying to give up smoking and his local dressing changes. He does not have any surgical options of 4 to him by his vascular surgeons.he was last seen in early August by Dr. Hortencia Pilar who recommended compression stockings,and possibly lymph pumps in 2-3 months after doing a review ultrasound. 01/28/2017 - the patient did not have any fresh complaints but on examination I noted a large lacerated wound on the plantar aspect of his right fourth toe which had a lot of necrotic debris and it probes down to bone. 02/07/2017 -- x-ray of the right foot -- IMPRESSION: Soft tissue swelling about the first through fourth toes consistent with cellulitis. New destructive change in the tuft of the distal phalanx of the great toe is consistent with osteomyelitis. Patient History Information obtained from Patient. Social History Current every day smoker, Marital Status - Widowed, Alcohol Use - Daily - quit drinking about a week ago, Drug Use - No History, Caffeine Use - Moderate. Medical And Surgical History Notes Oncologic 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 Objective Constitutional Pulse regular. Respirations  normal and unlabored. Afebrile. Adamson, Jerell [name]. (  [id number]) Vitals Time Taken: 2:09 PM, Height: 69 in, Weight: 168 lbs, BMI: 24.8, Temperature: 98.0 F, Pulse: 66 bpm, Respiratory Rate: 18 breaths/min, Blood Pressure: 142/83 mmHg. Eyes Nonicteric. Reactive to light. Ears, Nose, Mouth, and Throat Lips, teeth, and gums WNL.Marland Kitchen Moist mucosa without lesions. Neck supple and nontender. No palpable supraclavicular or cervical adenopathy. Normal sized without goiter. Respiratory WNL. No retractions.. Cardiovascular Pedal Pulses WNL. No clubbing, cyanosis or edema. Lymphatic No adneopathy. No adenopathy. No adenopathy. Musculoskeletal Adexa without tenderness or enlargement.. Digits and nails w/o clubbing, cyanosis, infection, petechiae, ischemia, or inflammatory conditions.Marland Kitchen Psychiatric Judgement and insight Intact.. No evidence of depression, anxiety, or agitation.. General Notes: right fourth toe continues to have minimal debris and I washed this out with moist saline gauze and it does probe down to bone. After doing the x-ray the right big toe is of concern regarding osteomyelitis but this does not have any wound opening to the bone. The wounds on the left leg are looking about the same Integumentary (Hair, Skin) No suspicious lesions. No crepitus or fluctuance. No peri-wound warmth or erythema. No masses.. Wound #1 status is Open. Original cause of wound was Gradually Appeared. The wound is located on the Right Toe Great. The wound measures 1.8cm length x 1.9cm width x 0.2cm depth; 2.686cm^2 area and 0.537cm^3 volume. There is Fat Layer (Subcutaneous Tissue) Exposed exposed. There is no tunneling or undermining noted. There is a large amount of serosanguineous drainage noted. Foul odor after cleansing was noted. The wound margin is flat and intact. There is medium (34-66%) pink granulation within the wound bed. There is a medium (34-66%) amount of necrotic tissue within the wound  bed including Adherent Slough. The periwound skin appearance exhibited: Excoriation, Maceration. The periwound skin appearance did not exhibit: Callus, Crepitus, Induration, Rash, Scarring, Dry/Scaly, Atrophie Blanche, Cyanosis, Ecchymosis, Hemosiderin Staining, Mottled, Pallor, Rubor, Erythema. Periwound temperature was noted as No Abnormality. Wound #10 status is Open. Original cause of wound was Not Known. The wound is located on the Right Toe Fourth. The wound measures 0.4cm length x 1.5cm width x 0.8cm depth; 0.471cm^2 area and 0.377cm^3 volume. There is Fat Layer (Subcutaneous Tissue) Exposed exposed. There is no tunneling or undermining noted. There is a large amount of serous drainage noted. Foul odor after cleansing was noted. The wound margin is flat and intact. There is small (1-33%) red granulation within the wound bed. There is a large (67-100%) amount of necrotic tissue within the wound bed including Eschar and Adherent Slough. The periwound skin appearance did not exhibit: Callus, Crepitus, Excoriation, Induration, Rash, Scarring, Dry/Scaly, Maceration, Atrophie Blanche, Cyanosis, Ecchymosis, Hemosiderin Staining, Mottled, Pallor, Rubor, Erythema. Periwound temperature was noted as No Abnormality. Wound #2 status is Open. Original cause of wound was Gradually Appeared. The wound is located on the Right Toe Second. The wound measures 0.8cm length x 0.7cm width x 0.1cm depth; 0.44cm^2 area and 0.044cm^3 volume. There is Fat Layer (Subcutaneous Tissue) Exposed exposed. There is no tunneling or undermining noted. There is a large amount of serosanguineous drainage noted. Foul odor after cleansing was noted. The wound margin is flat and intact. There is medium (34-66%) pink granulation within the wound bed. There is a medium (34-66%) amount of necrotic tissue within the wound bed [name], [name] ([id number]) including Adherent Slough. The periwound skin appearance exhibited: Excoriation,  Maceration. The periwound skin appearance did not exhibit: Callus, Crepitus, Induration, Rash, Scarring, Dry/Scaly, Atrophie Blanche, Cyanosis, Ecchymosis, Hemosiderin Staining, Mottled, Pallor, Rubor, Erythema. Periwound temperature was noted  as No Abnormality. Wound #5 status is Open. Original cause of wound was Gradually Appeared. The wound is located on the Left Toe Great. The wound measures 1.5cm length x 5.8cm width x 0.1cm depth; 6.833cm^2 area and 0.683cm^3 volume. There is Fat Layer (Subcutaneous Tissue) Exposed exposed. There is no tunneling or undermining noted. There is a large amount of serous drainage noted. Foul odor after cleansing was noted. The wound margin is flat and intact. There is medium (34-66%) pink granulation within the wound bed. There is a medium (34-66%) amount of necrotic tissue within the wound bed including Adherent Slough. The periwound skin appearance exhibited: Excoriation. The periwound skin appearance did not exhibit: Callus, Crepitus, Induration, Rash, Scarring, Dry/Scaly, Maceration, Atrophie Blanche, Cyanosis, Ecchymosis, Hemosiderin Staining, Mottled, Pallor, Rubor, Erythema. Periwound temperature was noted as No Abnormality. Wound #6 status is Open. Original cause of wound was Gradually Appeared. The wound is located on the Left Toe Second. The wound measures 1cm length x 0.7cm width x 0.1cm depth; 0.55cm^2 area and 0.055cm^3 volume. There is Fat Layer (Subcutaneous Tissue) Exposed exposed. There is no tunneling or undermining noted. There is a large amount of serous drainage noted. Foul odor after cleansing was noted. The wound margin is flat and intact. There is medium (34-66%) pink granulation within the wound bed. There is a medium (34-66%) amount of necrotic tissue within the wound bed including Adherent Slough. The periwound skin appearance exhibited: Excoriation, Maceration. The periwound skin appearance did not exhibit: Callus, Crepitus, Induration,  Rash, Scarring, Dry/Scaly, Atrophie Blanche, Cyanosis, Ecchymosis, Hemosiderin Staining, Mottled, Pallor, Rubor, Erythema. Periwound temperature was noted as No Abnormality. Wound #8 status is Open. Original cause of wound was Gradually Appeared. The wound is located on the Left Toe Third. The wound measures 0.7cm length x 0.6cm width x 0.1cm depth; 0.33cm^2 area and 0.033cm^3 volume. There is no tunneling or undermining noted. There is a large amount of serosanguineous drainage noted. The wound margin is distinct with the outline attached to the wound base. There is medium (34-66%) red granulation within the wound bed. There is a medium (34-66%) amount of necrotic tissue within the wound bed including Adherent Slough. The periwound skin appearance exhibited: Maceration. Periwound temperature was noted as No Abnormality. The periwound has tenderness on palpation. Wound #9 status is Open. Original cause of wound was Gradually Appeared. The wound is located on the Right Toe Third. The wound measures 0.6cm length x 0.6cm width x 0.1cm depth; 0.283cm^2 area and 0.028cm^3 volume. There is no tunneling or undermining noted. There is a large amount of serous drainage noted. Foul odor after cleansing was noted. The wound margin is flat and intact. There is medium (34-66%) pink granulation within the wound bed. There is a medium (34- 66%) amount of necrotic tissue within the wound bed including Adherent Slough. The periwound skin appearance did not exhibit: Callus, Crepitus, Excoriation, Induration, Rash, Scarring, Dry/Scaly, Maceration, Atrophie Blanche, Cyanosis, Ecchymosis, Hemosiderin Staining, Mottled, Pallor, Rubor, Erythema. Periwound temperature was noted as No Abnormality. 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 ([id number]) Plan Wound Cleansing:  Wound #1 Right Toe Great: Clean wound with Normal Saline. May Shower, gently pat wound dry prior to applying new dressing. Wound #10 Right Toe Fourth: Clean wound with Normal Saline. May Shower, gently pat wound dry prior to applying new dressing. Wound #2 Right Toe Second: Clean wound with Normal Saline. May Shower, gently pat wound dry prior to applying new dressing. Wound #5 Left Toe Great: Clean wound with Normal Saline. May Shower, gently pat wound dry prior to applying new dressing. Wound #6 Left Toe Second: Clean wound with Normal Saline. May Shower, gently pat wound dry prior to applying new dressing. Wound #8 Left Toe Third: Clean wound with Normal Saline. May Shower, gently pat wound dry prior to applying new dressing. Wound #9 Right Toe Third: Clean wound with Normal Saline. May Shower, gently pat wound dry prior to applying new dressing. Anesthetic: Wound #1 Right Toe Great: Topical Lidocaine 4% cream applied to wound bed prior to debridement Wound #10 Right Toe Fourth: Topical Lidocaine 4% cream applied to wound bed prior to debridement Wound #2 Right Toe Second: Topical Lidocaine 4% cream applied to wound bed prior to debridement Wound #5 Left Toe Great: Topical Lidocaine 4% cream applied to wound bed prior to debridement Wound #6 Left Toe Second: Topical Lidocaine 4% cream applied to wound bed prior to debridement Wound #8 Left Toe Third: Topical Lidocaine 4% cream applied to wound bed prior to debridement Wound #9 Right Toe Third: Topical Lidocaine 4% cream applied to wound bed prior  to debridement Primary Wound Dressing: Wound #1 Right Toe Great: Silvercel Non-Adherent Other: - nystatin Wound #10 Right Toe Fourth: Silvercel Non-Adherent Other: - nystatin Wound #2 Right Toe Second: Silvercel Non-Adherent Other: - nystatin Wound #5 Left Toe Great: Silvercel Non-Adherent Other: - nystatin Wound #6 Left Toe Second: Silvercel Non-Adherent Other: - nystatin Wound #8 Left Toe Third: Silvercel Non-Adherent Other: - nystatin [name], [name] ([id number]) Wound #9 Right Toe Third: Silvercel Non-Adherent Other: - nystatin Secondary Dressing: Wound #1 Right Toe Great: Gauze and Kerlix/Conform Wound #10 Right Toe Fourth: Gauze and Kerlix/Conform Wound #2 Right Toe Second: Gauze and Kerlix/Conform Wound #5 Left Toe Great: Gauze and Kerlix/Conform Wound #6 Left Toe Second: Gauze and Kerlix/Conform Wound #8 Left Toe Third: Gauze and Kerlix/Conform Wound #9 Right Toe Third: Gauze and Kerlix/Conform Dressing Change Frequency: Wound #1 Right Toe Great: Change dressing every day. Wound #10 Right Toe Fourth: Change dressing every day. Wound #2 Right Toe Second: Change dressing every day. Wound #5 Left Toe Great: Change dressing every day. Wound #6 Left Toe Second: Change dressing every day. Wound #8 Left Toe Third: Change dressing every day. Wound #9 Right Toe Third: Change dressing every day. Follow-up Appointments: Wound #1 Right Toe Great: Return Appointment in 1 week. Wound #10 Right Toe Fourth: Return Appointment in 1 week. Wound #2 Right Toe Second: Return Appointment in 1 week. Wound #5 Left Toe Great: Return Appointment in 1 week. Wound #6 Left Toe Second: Return Appointment in 1 week. Wound #8 Left Toe Third: Return Appointment in 1 week. Wound #9 Right Toe Third: Return Appointment in 1 week. Additional Orders / Instructions: Wound #1 Right Toe Great: Stop Smoking Increase protein intake. Other: - Please add vitamin A, vitamin C and  zinc supplements to your diet Wound #10 Right Toe Fourth: Stop Smoking Increase protein intake. Other: - Please add vitamin A, vitamin C and zinc supplements to your diet Wound #2 Right Toe Second: Stop Smoking Increase protein intake. [name], [name] (  [id number]) Other: - Please add vitamin A, vitamin C and zinc supplements to your diet Wound #5 Left Toe Great: Stop Smoking Increase protein intake. Other: - Please add vitamin A, vitamin C and zinc supplements to your diet Wound #6 Left Toe Second: Stop Smoking Increase protein intake. Other: - Please add vitamin A, vitamin C and zinc supplements to your diet Wound #8 Left Toe Third: Stop Smoking Increase protein intake. Other: - Please add vitamin A, vitamin C and zinc supplements to your diet Wound #9 Right Toe Third: Stop Smoking Increase protein intake. Other: - Please add vitamin A, vitamin C and zinc supplements to your diet Medications-please add to medication list.: Wound #10 Right Toe Fourth: P.O. Antibiotics - doxycycline Radiology ordered were: Magnetic Resonance Imaging (MRI) - right foot I was concerned about his right fourth toe but on plain x-ray the right big toe is showing some evidence of possible osteomyelitis. In view of this we will do an MRI of the right foot. I have recommended 1. packing this with Silver alginate and an appropriate foam off loading. 2. x-ray of his right foot with emphasis on the right fourth toe Is suspicious of the first toe having osteomyelitis and hence we will do an MRI 3. the patient is due to have a vascular opinion soon regarding his studies status post endovenous ablation. He may also benefit from a lymph pump at that time. We will await his vascular opinion soon. I have also asked him to continue local care with antifungal ointment between his toes and silver alginate dressing to the open areas on the left foot more than right foot. 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 Date of Service: 02/07/2017 1:30 PM Medical Record Number: [id number] Patient Account Number: [id number] Date of Birth/Sex: [birth year] (66 y.o. Male) Treating RN: Montey Hora Primary Care Provider: Myrtie Hawk Other Clinician: Referring Provider: Myrtie Hawk Treating Provider/Extender: Frann Rider in Treatment: 55 Information Obtained From Patient Wound History Do you currently have one or more open woundso Yes How many open wounds do you currently haveo 6 Approximately how long have you had your woundso 3 months How have you been treating your wound(s) until nowo ointment and bandage Has your wound(s) ever healed and then re-openedo No Have you had any lab work done in the past montho No Have you tested positive for an antibiotic resistant organism (MRSA, VRE)o No Have you tested positive for osteomyelitis (bone infection)o No Have you had any tests for circulation on your legso Yes Who ordered the testo PCP Where was the test doneo [facility] Medical History: Negative for: Cataracts; Glaucoma; Optic Neuritis Ear/Nose/Mouth/Throat Medical History: Negative for: Chronic sinus problems/congestion; Middle ear problems Hematologic/Lymphatic Medical History: Positive for: Anemia; Lymphedema Negative for: Hemophilia; Human Immunodeficiency Virus; Sickle Cell Disease Respiratory Medical History: Negative for: Aspiration; Asthma; Chronic Obstructive Pulmonary Disease (COPD); Pneumothorax; Sleep Apnea; Tuberculosis Cardiovascular Medical History: Positive for: Congestive Heart Failure; Hypertension; Peripheral Venous Disease Negative  for: Angina; Arrhythmia; Coronary Artery Disease; Deep Vein Thrombosis; Hypotension; Myocardial Infarction; Peripheral Arterial Disease; Phlebitis; Vasculitis Gastrointestinal Medical History: Negative for: Cirrhosis ; Colitis; Crohnos; Hepatitis A; Hepatitis B; Hepatitis C Blundell, Daiquan [name]. ([id number]) Endocrine Medical History: Positive for: Type II Diabetes Treated with: Oral agents Blood sugar tested every day:  Yes Tested : QD Genitourinary Medical History: Negative for: End Stage Renal Disease Immunological Medical History: Negative for: Lupus Erythematosus; Raynaudos; Scleroderma Integumentary (Skin) Medical History: Negative for: History of Burn; History of pressure wounds Musculoskeletal Medical History: Negative for: Gout; Rheumatoid Arthritis; Osteoarthritis; Osteomyelitis Neurologic Medical History: Positive for: Neuropathy Negative for: Dementia; Quadriplegia; Paraplegia; Seizure Disorder Oncologic Medical History: Past Medical History Notes: 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 Electronic Signature(s) Signed: 02/07/2017 4:34:23 PM By: Christin Fudge MD, FACS Signed: 02/07/2017 4:49:35 PM By: Deniece Ree, [name] ([id number]) Entered By: Christin Fudge on 02/07/2017 15:02:32 Drollinger, MARQUAVIS HANNEN  ([id number]) -------------------------------------------------------------------------------- SuperBill Details Patient Name: Ohanesian, Maykel [name]. Date of Service: 02/07/2017 Medical Record Number: [id number] Patient Account Number: [id number] Date of Birth/Sex: 11-12-1950 (66 y.o. Male) Treating RN: Montey Hora Primary Care Provider: Myrtie Hawk Other Clinician: Referring Provider: Myrtie Hawk Treating Provider/Extender: Frann Rider in Treatment: 19 Diagnosis Coding ICD-10 Codes Code Description E11.621 Type 2 diabetes mellitus with foot ulcer I87.313 Chronic venous hypertension (idiopathic) with ulcer of bilateral lower extremity I89.0 Lymphedema, not elsewhere classified L97.522 Non-pressure chronic ulcer of other part of left foot with fat layer exposed L97.512 Non-pressure chronic ulcer of other part of right foot with fat layer exposed F17.218 Nicotine dependence, cigarettes, 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 abuse with unspecified alcohol-induced disorder Physician Procedures CPT4 Code Description: [id number] - WC PHYS LEVEL 3 - EST PT ICD-10 Diagnosis Description E11.621 Type 2 diabetes mellitus with foot ulcer I87.313 Chronic venous hypertension (idiopathic) with ulcer of bilateral L97.522 Non-pressure chronic ulcer of  other part of left foot with fat l L97.512 Non-pressure chronic ulcer of other part of right foot with fat Modifier: lower extremi ayer exposed layer exposed Quantity: 1 ty Electronic Signature(s) Signed: 02/07/2017 3:06:13 PM By: Christin Fudge MD, FACS Entered By: Christin Fudge on 02/07/2017 15:06:12

## 2017-02-11 ENCOUNTER — Telehealth: Payer: Self-pay | Admitting: *Deleted

## 2017-02-11 ENCOUNTER — Other Ambulatory Visit: Payer: Self-pay | Admitting: *Deleted

## 2017-02-11 DIAGNOSIS — D509 Iron deficiency anemia, unspecified: Secondary | ICD-10-CM

## 2017-02-11 NOTE — Telephone Encounter (Signed)
Called patients cousin named Theodoro Doing who transports pt to the cancer center and ask if it is ok for them to come earlier for blood work first before seeing md.  With patient kidney function we may need to start him on shots for his anemia.  Theodoro Doing agreeable and will let pt know

## 2017-02-11 NOTE — Progress Notes (Signed)
THEADORE, BLUNCK ([id number]) Visit Report for 02/07/2017 Arrival Information Details Patient Name: Hinely, [name] Date of Service: 02/07/2017 1:30 PM Medical Record Number: [id number] Patient Account Number: [id number] Date of Birth/Sex: [birth year] (66 y.o. Male) Treating RN: Montey Hora Primary Care Adisson Deak: Myrtie Hawk Other Clinician: Referring Brentin Shin: Myrtie Hawk Treating Orvell Careaga/Extender: Frann Rider in Treatment: 38 Visit Information History Since Last Visit Added or deleted any medications: No Patient Arrived: Ambulatory Any new allergies or adverse reactions: No Arrival Time: 14:07 Had a fall or experienced change in No Accompanied By: brother activities of daily living that may affect Transfer Assistance: None risk of falls: Patient Identification Verified: Yes Signs or symptoms of abuse/neglect since last visito No Secondary Verification Process Completed: Yes Hospitalized since last visit: No Patient Requires Transmission-Based No Has Dressing in Place as Prescribed: Yes Precautions: Pain Present Now: No Patient Has Alerts: Yes Patient Alerts: DMII Electronic Signature(s) Signed: 02/07/2017 4:49:35 PM By: Montey Hora Entered By: Montey Hora on 02/07/2017 14:08:36 Kirkman, [name] ([id number]) -------------------------------------------------------------------------------- Encounter Discharge Information Details Patient Name: Buckholtz, [place] E. Date of Service: 02/07/2017 1:30 PM Medical Record Number: [id number] Patient Account Number: [id number] Date of Birth/Sex: 10/15/1950 (66 y.o. Male) Treating RN: Montey Hora Primary Care Payton Moder: Myrtie Hawk Other Clinician: Referring Michayla Mcneil: Myrtie Hawk Treating Aldena Worm/Extender: Frann Rider in Treatment: 19 Encounter Discharge Information Items Schedule Follow-up Appointment: No Medication Reconciliation completed and No provided to Patient/Care  Katrell Milhorn: Provided on Clinical Summary of Care: 02/07/2017 Form Type Recipient Paper Patient ED Electronic Signature(s) Signed: 02/11/2017 10:06:49 AM By: Ruthine Dose Entered By: Ruthine Dose on 02/07/2017 14:50:09 Vallance, [name] ([id number]) -------------------------------------------------------------------------------- Lower Extremity Assessment Details Patient Name: Mccaslin, Fayette E. Date of Service: 02/07/2017 1:30 PM Medical Record Number: [id number] Patient Account Number: [id number] Date of Birth/Sex: 1950-05-25 (66 y.o. Male) Treating RN: Montey Hora Primary Care Frimy Uffelman: Myrtie Hawk Other Clinician: Referring Tsuyako Jolley: Myrtie Hawk Treating Starlette Thurow/Extender: Frann Rider in Treatment: 19 Vascular Assessment Pulses: Dorsalis Pedis Palpable: [Left:Yes] [Right:Yes] Posterior Tibial Extremity colors, hair growth, and conditions: Extremity Color: [Left:Hyperpigmented] [Right:Hyperpigmented] Hair Growth on Extremity: [Left:No] [Right:No] Temperature of Extremity: [Left:Warm] [Right:Warm] Capillary Refill: [Left:< 3 seconds] [Right:< 3 seconds] Electronic Signature(s) Signed: 02/07/2017 4:49:35 PM By: Montey Hora Entered By: Montey Hora on 02/07/2017 14:19:26 [place]combe, Faysal E. ([id number]) -------------------------------------------------------------------------------- Multi Wound Chart Details Patient Name: Boateng, Torrence E. Date of Service: 02/07/2017 1:30 PM Medical Record Number: [id number] Patient Account Number: [id number] Date of Birth/Sex: 21-Aug-1950 (66 y.o. Male) Treating RN: Montey Hora Primary Care Nickia Boesen: Myrtie Hawk Other Clinician: Referring Antwaine Boomhower: Myrtie Hawk Treating Nyra Anspaugh/Extender: Frann Rider in Treatment: 19 Vital Signs Height(in): 68 Pulse(bpm): 10 Weight(lbs): 168 Blood Pressure(mmHg): 142/83 Body Mass Index(BMI): 25 Temperature(F): 98.0 Respiratory  Rate 18 (breaths/min): Photos: [1:No Photos] [10:No Photos] [2:No Photos] Wound Location: [1:Right Toe Great] [10:Right Toe Fourth] [2:Right Toe Second] Wounding Event: [1:Gradually Appeared] [10:Not Known] [2:Gradually Appeared] Primary Etiology: [1:Diabetic Wound/Ulcer of the Lower Extremity] [10:To be determined] [2:Diabetic Wound/Ulcer of the Lower Extremity] Comorbid History: [1:Anemia, Lymphedema, Congestive Heart Failure, Hypertension, Peripheral Venous Disease, Type II Diabetes, Neuropathy] [10:Anemia, Lymphedema, Congestive Heart Failure, Hypertension, Peripheral Venous Disease, Type II Diabetes,  Neuropathy] [2:Anemia, Lymphedema, Congestive Heart Failure, Hypertension, Peripheral Venous Disease, Type II Diabetes, Neuropathy] Date Acquired: [1:06/11/2016] [10:01/28/2017] [2:06/11/2016] Weeks of Treatment: [1:19] [10:1] [2:19] Wound Status: [1:Open] [10:Open] [2:Open] Pending Amputation on [1:Yes] [10:No] [2:Yes] Presentation: Measurements L x W x D [1:1.8x1.9x0.2] [10:0.4x1.5x0.8] [2:0.8x0.7x0.1] (cm) Area (cm) : [1:2.686] [10:0.471] [2:0.44] Volume (cm) : [1:0.537] [10:0.377] [2:0.044] %  Reduction in Area: [1:94.30%] [10:57.20%] [2:85.70%] % Reduction in Volume: [1:88.60%] [10:61.90%] [2:85.70%] Classification: [1:Grade 1] [10:Full Thickness With Exposed Support Structures] [2:Grade 1] Exudate Amount: [1:Large] [10:Large] [2:Large] Exudate Type: [1:Serosanguineous] [10:Serous] [2:Serosanguineous] Exudate Color: [1:red, brown] [10:amber] [2:red, brown] Foul Odor After Cleansing: [1:Yes] [10:Yes] [2:Yes] Odor Anticipated Due to [1:No] [10:No] [2:No] Product Use: Wound Margin: [1:Flat and Intact] [10:Flat and Intact] [2:Flat and Intact] Granulation Amount: [1:Medium (34-66%)] [10:Small (1-33%)] [2:Medium (34-66%)] Granulation Quality: [1:Pink] [10:Red] [2:Pink] Necrotic Amount: [1:Medium (34-66%)] [10:Large (67-100%)] [2:Medium (34-66%)] Necrotic Tissue: [1:Adherent Slough]  [10:Eschar, Adherent Slough] [2:Adherent Slough] Exposed Structures: [1:Fat Layer (Subcutaneous Tissue) Exposed: Yes Fascia: No] [10:Fat Layer (Subcutaneous Tissue) Exposed: Yes Fascia: No] [2:Fat Layer (Subcutaneous Tissue) Exposed: Yes Fascia: No] Tendon: No Tendon: No Tendon: No Muscle: No Muscle: No Muscle: No Joint: No Joint: No Joint: No Bone: No Bone: No Bone: No Epithelialization: Small (1-33%) None Small (1-33%) Periwound Skin Texture: Excoriation: Yes Excoriation: No Excoriation: Yes Induration: No Induration: No Induration: No Callus: No Callus: No Callus: No Crepitus: No Crepitus: No Crepitus: No Rash: No Rash: No Rash: No Scarring: No Scarring: No Scarring: No Periwound Skin Moisture: Maceration: Yes Maceration: No Maceration: Yes Dry/Scaly: No Dry/Scaly: No Dry/Scaly: No Periwound Skin Color: Atrophie Blanche: No Atrophie Blanche: No Atrophie Blanche: No Cyanosis: No Cyanosis: No Cyanosis: No Ecchymosis: No Ecchymosis: No Ecchymosis: No Erythema: No Erythema: No Erythema: No Hemosiderin Staining: No Hemosiderin Staining: No Hemosiderin Staining: No Mottled: No Mottled: No Mottled: No Pallor: No Pallor: No Pallor: No Rubor: No Rubor: No Rubor: No Temperature: No Abnormality No Abnormality No Abnormality Tenderness on Palpation: No No No Wound Preparation: Ulcer Cleansing: Ulcer Cleansing: Ulcer Cleansing: Rinsed/Irrigated with Saline Rinsed/Irrigated with Saline Rinsed/Irrigated with Saline Topical Anesthetic Applied: Topical Anesthetic Applied: Topical Anesthetic Applied: Other: lidocaine 4% Other: lidocaine 4% Other: lidocaine 4% Wound Number: [id number]  Photos: No Photos No Photos No Photos Wound Location: Left Toe Great Left Toe Second Left Toe Third Wounding Event: Gradually Appeared Gradually Appeared Gradually Appeared Primary Etiology: Diabetic Wound/Ulcer of the Diabetic Wound/Ulcer of the Diabetic Wound/Ulcer of  the Lower Extremity Lower Extremity Lower Extremity Comorbid History: Anemia, Lymphedema, Anemia, Lymphedema, Anemia, Lymphedema, Congestive Heart Failure, Congestive Heart Failure, Congestive Heart Failure, Hypertension, Peripheral Hypertension, Peripheral Hypertension, Peripheral Venous Disease, Type II Venous Disease, Type II Venous Disease, Type II Diabetes, Neuropathy Diabetes, Neuropathy Diabetes, Neuropathy Date Acquired: 06/11/2016 06/11/2016 12/30/2016 Weeks of Treatment: 19 19 5  Wound Status: Open Open Open Pending Amputation on Yes Yes No Presentation: Measurements L x W x D 1.5x5.8x0.1 1x0.7x0.1 0.7x0.6x0.1 (cm) Area (cm) : 6.833 0.55 0.33 Volume (cm) : 0.683 0.055 0.033 % Reduction in Area: 52.20% 74.30% 58.80% % Reduction in Volume: 52.20% 74.30% 58.80% Classification: Grade 1 Grade 1 Grade 1 Exudate Amount: Large Large Large Exudate Type: Serous Serous Serosanguineous Exudate Color: amber amber red, brown Foul Odor After Cleansing: Yes Yes No Odor Anticipated Due to No No N/A Product Use: Huckeba, Alazar E. ([id number]) Wound Margin: Flat and Intact Flat and Intact Distinct, outline attached Granulation Amount: Medium (34-66%) Medium (34-66%) Medium (34-66%) Granulation Quality: Pink Pink Red Necrotic Amount: Medium (34-66%) Medium (34-66%) Medium (34-66%) Necrotic Tissue: Adherent [place] Exposed Structures: Fat Layer (Subcutaneous Fat Layer (Subcutaneous N/A Tissue) Exposed: Yes Tissue) Exposed: Yes Fascia: No Fascia: No Tendon: No Tendon: No Muscle: No Muscle: No Joint: No Joint: No Bone: No Bone: No Epithelialization: Small (1-33%) Small (1-33%) None Periwound Skin Texture: Excoriation: Yes Excoriation: Yes No Abnormalities Noted  Induration: No Induration: No Callus: No Callus: No Crepitus: No Crepitus: No Rash: No Rash: No Scarring: No Scarring: No Periwound Skin Moisture: Maceration: No Maceration:  Yes Maceration: Yes Dry/Scaly: No Dry/Scaly: No Periwound Skin Color: Atrophie Blanche: No Atrophie Blanche: No No Abnormalities Noted Cyanosis: No Cyanosis: No Ecchymosis: No Ecchymosis: No Erythema: No Erythema: No Hemosiderin Staining: No Hemosiderin Staining: No Mottled: No Mottled: No Pallor: No Pallor: No Rubor: No Rubor: No Temperature: No Abnormality No Abnormality No Abnormality Tenderness on Palpation: No No Yes Wound Preparation: Ulcer Cleansing: Ulcer Cleansing: Ulcer Cleansing: Rinsed/Irrigated with Saline Rinsed/Irrigated with Saline Rinsed/Irrigated with Saline Topical Anesthetic Applied: Topical Anesthetic Applied: Topical Anesthetic Applied: Other: lidocaine 4% Other: lidocaine 4% Other: lidocaine 4% Wound Number: 9 N/A N/A Photos: No Photos N/A N/A Wound Location: Right Toe Third N/A N/A Wounding Event: Gradually Appeared N/A N/A Primary Etiology: Diabetic Wound/Ulcer of the N/A N/A Lower Extremity Comorbid History: Anemia, Lymphedema, N/A N/A Congestive Heart Failure, Hypertension, Peripheral Venous Disease, Type II Diabetes, Neuropathy Date Acquired: 12/31/2016 N/A N/A Weeks of Treatment: 5 N/A N/A Wound Status: Open N/A N/A Pending Amputation on No N/A N/A Presentation: Measurements L x W x D 0.6x0.6x0.1 N/A N/A (cm) Area (cm) : 0.283 N/A N/A Volume (cm) : 0.028 N/A N/A % Reduction in Area: 28.00% N/A N/A Clutter, Renaldo E. ([id number]) % Reduction in Volume: 28.20% N/A N/A Classification: Grade 1 N/A N/A Exudate Amount: Large N/A N/A Exudate Type: Serous N/A N/A Exudate Color: amber N/A N/A Foul Odor After Cleansing: Yes N/A N/A Odor Anticipated Due to No N/A N/A Product Use: Wound Margin: Flat and Intact N/A N/A Granulation Amount: Medium (34-66%) N/A N/A Granulation Quality: Pink N/A N/A Necrotic Amount: Medium (34-66%) N/A N/A Necrotic Tissue: Adherent Slough N/A N/A Exposed Structures: Fascia: No N/A N/A Fat Layer  (Subcutaneous Tissue) Exposed: No Tendon: No Muscle: No Joint: No Bone: No Epithelialization: Medium (34-66%) N/A N/A Periwound Skin Texture: Excoriation: No N/A N/A Induration: No Callus: No Crepitus: No Rash: No Scarring: No Periwound Skin Moisture: Maceration: No N/A N/A Dry/Scaly: No Periwound Skin Color: Atrophie Blanche: No N/A N/A Cyanosis: No Ecchymosis: No Erythema: No Hemosiderin Staining: No Mottled: No Pallor: No Rubor: No Temperature: No Abnormality N/A N/A Tenderness on Palpation: No N/A N/A Wound Preparation: Ulcer Cleansing: N/A N/A Rinsed/Irrigated with Saline Topical Anesthetic Applied: Other: lidocaine 4% Treatment Notes Electronic Signature(s) Signed: 02/07/2017 3:01:56 PM By: Christin Fudge MD, FACS Entered By: Christin Fudge on 02/07/2017 15:01:56 Rogus, [name] ([id number]) -------------------------------------------------------------------------------- [place] Details Patient Name: Garling, Mehkai E. Date of Service: 02/07/2017 1:30 PM Medical Record Number: [id number] Patient Account Number: [id number] Date of Birth/Sex: May 05, 1950 (66 y.o. Male) Treating RN: Montey Hora Primary Care Adrie Picking: Myrtie Hawk Other Clinician: Referring Tene Gato: Myrtie Hawk Treating Sohail Capraro/Extender: Frann Rider in Treatment: 74 Active Inactive ` Abuse / Safety / Falls / Self Care Management Nursing Diagnoses: Potential for falls Goals: Patient will remain injury free related to falls Date Initiated: 09/21/2016 Target Resolution Date: 11/30/2016 Goal Status: Active Interventions: Assess fall risk on admission and as needed Notes: ` Nutrition Nursing Diagnoses: Potential for alteratiion in Nutrition/Potential for imbalanced nutrition Goals: Patient/caregiver agrees to and verbalizes understanding of need to use nutritional supplements and/or vitamins as prescribed Date Initiated: 09/21/2016 Target  Resolution Date: 11/30/2016 Goal Status: Active Interventions: Assess patient nutrition upon admission and as needed per policy Notes: ` Orientation to the Wound Care Program Nursing Diagnoses: Knowledge deficit related to the wound healing center program Goals: Patient/caregiver  will verbalize understanding of the [place] Date Initiated: 09/21/2016 Target Resolution Date: 11/30/2016 Goal Status: Active Interventions: Lease, JAXTIN RAIMONDO ([id number]) Provide education on orientation to the wound center Notes: ` Wound/Skin Impairment Nursing Diagnoses: Knowledge deficit related to smoking impact on wound healing Goals: Ulcer/skin breakdown will have a volume reduction of 30% by week 4 Date Initiated: 09/21/2016 Target Resolution Date: 11/30/2016 Goal Status: Active Ulcer/skin breakdown will have a volume reduction of 50% by week 8 Date Initiated: 09/21/2016 Target Resolution Date: 11/30/2016 Goal Status: Active Ulcer/skin breakdown will have a volume reduction of 80% by week 12 Date Initiated: 09/21/2016 Target Resolution Date: 11/30/2016 Goal Status: Active Ulcer/skin breakdown will heal within 14 weeks Date Initiated: 09/21/2016 Target Resolution Date: 11/30/2016 Goal Status: Active Interventions: Assess patient/caregiver ability to obtain necessary supplies Assess patient/caregiver ability to perform ulcer/skin care regimen upon admission and as needed Assess ulceration(s) every visit Notes: Electronic Signature(s) Signed: 02/07/2017 4:49:35 PM By: Montey Hora Entered By: Montey Hora on 02/07/2017 14:27:11 Klee, [name] ([id number]) -------------------------------------------------------------------------------- Pain Assessment Details Patient Name: Grothaus, Daley E. Date of Service: 02/07/2017 1:30 PM Medical Record Number: [id number] Patient Account Number: [id number] Date of Birth/Sex: [birth year] (66 y.o. Male) Treating RN: Montey Hora Primary Care  Danikah Budzik: Myrtie Hawk Other Clinician: Referring Jasaiah Karwowski: Myrtie Hawk Treating Arnette Driggs/Extender: Frann Rider in Treatment: 59 Active Problems Location of Pain Severity and Description of Pain Patient Has Paino No Site Locations Pain Management and Medication Current Pain Management: Notes Topical or injectable lidocaine is offered to patient for acute pain when surgical debridement is performed. If needed, Patient is instructed to use over the counter pain medication for the following 24-48 hours after debridement. Wound care MDs do not prescribed pain medications. Patient has chronic pain or uncontrolled pain. Patient has been instructed to make an appointment with their Primary Care Physician for pain management. Electronic Signature(s) Signed: 02/07/2017 4:49:35 PM By: Montey Hora Entered By: Montey Hora on 02/07/2017 14:08:45 Necaise, [name] ([id number]) -------------------------------------------------------------------------------- Wound Assessment Details Patient Name: Blanton, Montey E. Date of Service: 02/07/2017 1:30 PM Medical Record Number: [id number] Patient Account Number: [id number] Date of Birth/Sex: 1950/07/23 (66 y.o. Male) Treating RN: Montey Hora Primary Care Kechia Yahnke: Myrtie Hawk Other Clinician: Referring Shaheim Mahar: Myrtie Hawk Treating Ajah Vanhoose/Extender: Frann Rider in Treatment: 19 Wound Status Wound Number: 1 Primary Diabetic Wound/Ulcer of the Lower Extremity Etiology: Wound Location: Right Toe Great Wound Open Wounding Event: Gradually Appeared Status: Date Acquired: 06/11/2016 Comorbid Anemia, Lymphedema, Congestive Heart Weeks Of Treatment: 19 History: Failure, Hypertension, Peripheral Venous Clustered Wound: No Disease, Type II Diabetes, Neuropathy Pending Amputation On Presentation Photos Photo Uploaded By: Montey Hora on 02/07/2017 15:26:27 Wound Measurements Length: (cm)  1.8 Width: (cm) 1.9 Depth: (cm) 0.2 Area: (cm) 2.686 Volume: (cm) 0.537 % Reduction in Area: 94.3% % Reduction in Volume: 88.6% Epithelialization: Small (1-33%) Tunneling: No Undermining: No Wound Description Classification: Grade 1 Wound Margin: Flat and Intact Exudate Amount: Large Exudate Type: Serosanguineous Exudate Color: red, brown Foul Odor After Cleansing: Yes Due to Product Use: No Slough/Fibrino Yes Wound Bed Granulation Amount: Medium (34-66%) Exposed Structure Granulation Quality: Pink Fascia Exposed: No Necrotic Amount: Medium (34-66%) Fat Layer (Subcutaneous Tissue) Exposed: Yes Necrotic Quality: Adherent Slough Tendon Exposed: No Muscle Exposed: No Joint Exposed: No Bone Exposed: No Periwound Skin Texture Bourgoin, Gilles E. ([id number]) Texture Color No Abnormalities Noted: No No Abnormalities Noted: No Callus: No Atrophie Blanche: No Crepitus: No Cyanosis: No Excoriation: Yes Ecchymosis: No Induration: No Erythema: No Rash: No  Hemosiderin Staining: No Scarring: No Mottled: No Pallor: No Moisture Rubor: No No Abnormalities Noted: No Dry / Scaly: No Temperature / Pain Maceration: Yes Temperature: No Abnormality Wound Preparation Ulcer Cleansing: Rinsed/Irrigated with Saline Topical Anesthetic Applied: Other: lidocaine 4%, Electronic Signature(s) Signed: 02/07/2017 2:21:29 PM By: Montey Hora Entered By: Montey Hora on 02/07/2017 14:21:28 Thain, [name] ([id number]) -------------------------------------------------------------------------------- Wound Assessment Details Patient Name: Manso, Willoughby E. Date of Service: 02/07/2017 1:30 PM Medical Record Number: [id number] Patient Account Number: [id number] Date of Birth/Sex: Apr 24, 1950 (66 y.o. Male) Treating RN: Montey Hora Primary Care Alcide Memoli: Myrtie Hawk Other Clinician: Referring Aristeo Hankerson: Myrtie Hawk Treating Julion Gatt/Extender: Frann Rider in  Treatment: 19 Wound Status Wound Number: 10 Primary To be determined Etiology: Wound Location: Right Toe Fourth Wound Open Wounding Event: Not Known Status: Date Acquired: 01/28/2017 Comorbid Anemia, Lymphedema, Congestive Heart Weeks Of Treatment: 1 History: Failure, Hypertension, Peripheral Venous Clustered Wound: No Disease, Type II Diabetes, Neuropathy Photos Photo Uploaded By: Montey Hora on 02/07/2017 15:26:28 Wound Measurements Length: (cm) 0.4 Width: (cm) 1.5 Depth: (cm) 0.8 Area: (cm) 0.471 Volume: (cm) 0.377 % Reduction in Area: 57.2% % Reduction in Volume: 61.9% Epithelialization: None Tunneling: No Undermining: No Wound Description Full Thickness With Exposed Support Classification: Structures Wound Margin: Flat and Intact Exudate Large Amount: Exudate Type: Serous Exudate Color: amber Foul Odor After Cleansing: Yes Due to Product Use: No Slough/Fibrino Yes Wound Bed Granulation Amount: Small (1-33%) Exposed Structure Granulation Quality: Red Fascia Exposed: No Necrotic Amount: Large (67-100%) Fat Layer (Subcutaneous Tissue) Exposed: Yes Necrotic Quality: Eschar, Adherent Slough Tendon Exposed: No Muscle Exposed: No Joint Exposed: No Bone Exposed: No Arroyave, Bailey E. ([id number]) Periwound Skin Texture Texture Color No Abnormalities Noted: No No Abnormalities Noted: No Callus: No Atrophie Blanche: No Crepitus: No Cyanosis: No Excoriation: No Ecchymosis: No Induration: No Erythema: No Rash: No Hemosiderin Staining: No Scarring: No Mottled: No Pallor: No Moisture Rubor: No No Abnormalities Noted: No Dry / Scaly: No Temperature / Pain Maceration: No Temperature: No Abnormality Wound Preparation Ulcer Cleansing: Rinsed/Irrigated with Saline Topical Anesthetic Applied: Other: lidocaine 4%, Electronic Signature(s) Signed: 02/07/2017 2:21:53 PM By: Montey Hora Entered By: Montey Hora on 02/07/2017 14:21:53 Dutil, [name] ([id number]) -------------------------------------------------------------------------------- Wound Assessment Details Patient Name: Leduc, Neils E. Date of Service: 02/07/2017 1:30 PM Medical Record Number: [id number] Patient Account Number: [id number] Date of Birth/Sex: 1950/06/01 (66 y.o. Male) Treating RN: Montey Hora Primary Care Alyssamae Klinck: Myrtie Hawk Other Clinician: Referring Jernie Schutt: Myrtie Hawk Treating Jasia Hiltunen/Extender: Frann Rider in Treatment: 19 Wound Status Wound Number: 2 Primary Diabetic Wound/Ulcer of the Lower Extremity Etiology: Wound Location: Right Toe Second Wound Open Wounding Event: Gradually Appeared Status: Date Acquired: 06/11/2016 Comorbid Anemia, Lymphedema, Congestive Heart Weeks Of Treatment: 19 History: Failure, Hypertension, Peripheral Venous Clustered Wound: No Disease, Type II Diabetes, Neuropathy Pending Amputation On Presentation Photos Photo Uploaded By: Montey Hora on 02/07/2017 15:27:26 Wound Measurements Length: (cm) 0.8 Width: (cm) 0.7 Depth: (cm) 0.1 Area: (cm) 0.44 Volume: (cm) 0.044 % Reduction in Area: 85.7% % Reduction in Volume: 85.7% Epithelialization: Small (1-33%) Tunneling: No Undermining: No Wound Description Classification: Grade 1 Wound Margin: Flat and Intact Exudate Amount: Large Exudate Type: Serosanguineous Exudate Color: red, brown Foul Odor After Cleansing: Yes Due to Product Use: No Slough/Fibrino Yes Wound Bed Granulation Amount: Medium (34-66%) Exposed Structure Granulation Quality: Pink Fascia Exposed: No Necrotic Amount: Medium (34-66%) Fat Layer (Subcutaneous Tissue) Exposed: Yes Necrotic Quality: Adherent Slough Tendon Exposed: No Muscle Exposed: No Joint Exposed: No Bone Exposed: No  Periwound Skin Texture [place], Blayden E. ([id number]) Texture Color No Abnormalities Noted: No No Abnormalities Noted: No Callus: No Atrophie Blanche: No Crepitus:  No Cyanosis: No Excoriation: Yes Ecchymosis: No Induration: No Erythema: No Rash: No Hemosiderin Staining: No Scarring: No Mottled: No Pallor: No Moisture Rubor: No No Abnormalities Noted: No Dry / Scaly: No Temperature / Pain Maceration: Yes Temperature: No Abnormality Wound Preparation Ulcer Cleansing: Rinsed/Irrigated with Saline Topical Anesthetic Applied: Other: lidocaine 4%, Electronic Signature(s) Signed: 02/07/2017 2:22:21 PM By: Montey Hora Entered By: Montey Hora on 02/07/2017 14:22:20 Farrier, [name] ([id number]) -------------------------------------------------------------------------------- Wound Assessment Details Patient Name: Wolin, Cutberto E. Date of Service: 02/07/2017 1:30 PM Medical Record Number: [id number] Patient Account Number: [id number] Date of Birth/Sex: February 28, 1951 (66 y.o. Male) Treating RN: Montey Hora Primary Care Kelia Gibbon: Myrtie Hawk Other Clinician: Referring Samul Mcinroy: Myrtie Hawk Treating Lemon Whitacre/Extender: Frann Rider in Treatment: 19 Wound Status Wound Number: 5 Primary Diabetic Wound/Ulcer of the Lower Extremity Etiology: Wound Location: Left Toe Great Wound Open Wounding Event: Gradually Appeared Status: Date Acquired: 06/11/2016 Comorbid Anemia, Lymphedema, Congestive Heart Weeks Of Treatment: 19 History: Failure, Hypertension, Peripheral Venous Clustered Wound: No Disease, Type II Diabetes, Neuropathy Pending Amputation On Presentation Photos Photo Uploaded By: Montey Hora on 02/07/2017 15:27:58 Wound Measurements Length: (cm) 1.5 Width: (cm) 5.8 Depth: (cm) 0.1 Area: (cm) 6.833 Volume: (cm) 0.683 % Reduction in Area: 52.2% % Reduction in Volume: 52.2% Epithelialization: Small (1-33%) Tunneling: No Undermining: No Wound Description Classification: Grade 1 Wound Margin: Flat and Intact Exudate Amount: Large Exudate Type: Serous Exudate Color: amber Foul Odor After  Cleansing: Yes Due to Product Use: No Slough/Fibrino Yes Wound Bed Granulation Amount: Medium (34-66%) Exposed Structure Granulation Quality: Pink Fascia Exposed: No Necrotic Amount: Medium (34-66%) Fat Layer (Subcutaneous Tissue) Exposed: Yes Necrotic Quality: Adherent Slough Tendon Exposed: No Muscle Exposed: No Joint Exposed: No Bone Exposed: No Periwound Skin Texture Hannen, Zachery E. ([id number]) Texture Color No Abnormalities Noted: No No Abnormalities Noted: No Callus: No Atrophie Blanche: No Crepitus: No Cyanosis: No Excoriation: Yes Ecchymosis: No Induration: No Erythema: No Rash: No Hemosiderin Staining: No Scarring: No Mottled: No Pallor: No Moisture Rubor: No No Abnormalities Noted: No Dry / Scaly: No Temperature / Pain Maceration: No Temperature: No Abnormality Wound Preparation Ulcer Cleansing: Rinsed/Irrigated with Saline Topical Anesthetic Applied: Other: lidocaine 4%, Electronic Signature(s) Signed: 02/07/2017 2:22:44 PM By: Montey Hora Entered By: Montey Hora on 02/07/2017 14:22:44 Dechaine, [name] ([id number]) -------------------------------------------------------------------------------- Wound Assessment Details Patient Name: Laramie, Dillan E. Date of Service: 02/07/2017 1:30 PM Medical Record Number: [id number] Patient Account Number: [id number] Date of Birth/Sex: 1950/11/12 (66 y.o. Male) Treating RN: Montey Hora Primary Care Deaven Urwin: Myrtie Hawk Other Clinician: Referring Devanie Galanti: Myrtie Hawk Treating Isaic Syler/Extender: Frann Rider in Treatment: 19 Wound Status Wound Number: 6 Primary Diabetic Wound/Ulcer of the Lower Extremity Etiology: Wound Location: Left Toe Second Wound Open Wounding Event: Gradually Appeared Status: Date Acquired: 06/11/2016 Comorbid Anemia, Lymphedema, Congestive Heart Weeks Of Treatment: 19 History: Failure, Hypertension, Peripheral Venous Clustered Wound: No Disease, Type  II Diabetes, Neuropathy Pending Amputation On Presentation Photos Photo Uploaded By: Montey Hora on 02/07/2017 15:27:27 Wound Measurements Length: (cm) 1 Width: (cm) 0.7 Depth: (cm) 0.1 Area: (cm) 0.55 Volume: (cm) 0.055 % Reduction in Area: 74.3% % Reduction in Volume: 74.3% Epithelialization: Small (1-33%) Tunneling: No Undermining: No Wound Description Classification: Grade 1 Wound Margin: Flat and Intact Exudate Amount: Large Exudate Type: Serous Exudate Color: amber Foul Odor After Cleansing: Yes Due to Product Use: No Slough/Fibrino Yes Wound  Bed Granulation Amount: Medium (34-66%) Exposed Structure Granulation Quality: Pink Fascia Exposed: No Necrotic Amount: Medium (34-66%) Fat Layer (Subcutaneous Tissue) Exposed: Yes Necrotic Quality: Adherent Slough Tendon Exposed: No Muscle Exposed: No Joint Exposed: No Bone Exposed: No Periwound Skin Texture Uram, Dariusz E. ([id number]) Texture Color No Abnormalities Noted: No No Abnormalities Noted: No Callus: No Atrophie Blanche: No Crepitus: No Cyanosis: No Excoriation: Yes Ecchymosis: No Induration: No Erythema: No Rash: No Hemosiderin Staining: No Scarring: No Mottled: No Pallor: No Moisture Rubor: No No Abnormalities Noted: No Dry / Scaly: No Temperature / Pain Maceration: Yes Temperature: No Abnormality Wound Preparation Ulcer Cleansing: Rinsed/Irrigated with Saline Topical Anesthetic Applied: Other: lidocaine 4%, Electronic Signature(s) Signed: 02/07/2017 2:23:06 PM By: Montey Hora Entered By: Montey Hora on 02/07/2017 14:23:05 Buehler, [name] ([id number]) -------------------------------------------------------------------------------- Wound Assessment Details Patient Name: Birchler, Nikolay E. Date of Service: 02/07/2017 1:30 PM Medical Record Number: [id number] Patient Account Number: [id number] Date of Birth/Sex: 1950/06/17 (66 y.o. Male) Treating RN: Montey Hora Primary Care  Lejuan Botto: Myrtie Hawk Other Clinician: Referring Matthew Cina: Myrtie Hawk Treating Nyeli Holtmeyer/Extender: Frann Rider in Treatment: 19 Wound Status Wound Number: 8 Primary Diabetic Wound/Ulcer of the Lower Extremity Etiology: Wound Location: Left Toe Third Wound Open Wounding Event: Gradually Appeared Status: Date Acquired: 12/30/2016 Comorbid Anemia, Lymphedema, Congestive Heart Weeks Of Treatment: 5 History: Failure, Hypertension, Peripheral Venous Clustered Wound: No Disease, Type II Diabetes, Neuropathy Photos Photo Uploaded By: Montey Hora on 02/07/2017 15:28:26 Wound Measurements Length: (cm) 0.7 Width: (cm) 0.6 Depth: (cm) 0.1 Area: (cm) 0.33 Volume: (cm) 0.033 % Reduction in Area: 58.8% % Reduction in Volume: 58.8% Epithelialization: None Tunneling: No Undermining: No Wound Description Classification: Grade 1 Wound Margin: Distinct, outline attached Exudate Amount: Large Exudate Type: Serosanguineous Exudate Color: red, brown Foul Odor After Cleansing: No Slough/Fibrino Yes Wound Bed Granulation Amount: Medium (34-66%) Granulation Quality: Red Necrotic Amount: Medium (34-66%) Necrotic Quality: Adherent Slough Periwound Skin Texture Texture Color No Abnormalities Noted: No No Abnormalities Noted: No Moisture Temperature / Pain Brosseau, Donnelle E. ([id number]) No Abnormalities Noted: No Temperature: No Abnormality Maceration: Yes Tenderness on Palpation: Yes Wound Preparation Ulcer Cleansing: Rinsed/Irrigated with Saline Topical Anesthetic Applied: Other: lidocaine 4%, Electronic Signature(s) Signed: 02/07/2017 2:23:32 PM By: Montey Hora Entered By: Montey Hora on 02/07/2017 14:23:32 Persing, [name] ([id number]) -------------------------------------------------------------------------------- Wound Assessment Details Patient Name: Lokey, Syris E. Date of Service: 02/07/2017 1:30 PM Medical Record Number:  [id number] Patient Account Number: [id number] Date of Birth/Sex: Feb 04, 1951 (66 y.o. Male) Treating RN: Montey Hora Primary Care Lissandra Keil: Myrtie Hawk Other Clinician: Referring Eesha Schmaltz: Myrtie Hawk Treating Nawaal Alling/Extender: Frann Rider in Treatment: 19 Wound Status Wound Number: 9 Primary Diabetic Wound/Ulcer of the Lower Extremity Etiology: Wound Location: Right Toe Third Wound Open Wounding Event: Gradually Appeared Status: Date Acquired: 12/31/2016 Comorbid Anemia, Lymphedema, Congestive Heart Weeks Of Treatment: 5 History: Failure, Hypertension, Peripheral Venous Clustered Wound: No Disease, Type II Diabetes, Neuropathy Photos Photo Uploaded By: Montey Hora on 02/07/2017 15:28:27 Wound Measurements Length: (cm) 0.6 Width: (cm) 0.6 Depth: (cm) 0.1 Area: (cm) 0.283 Volume: (cm) 0.028 % Reduction in Area: 28% % Reduction in Volume: 28.2% Epithelialization: Medium (34-66%) Tunneling: No Undermining: No Wound Description Classification: Grade 1 Wound Margin: Flat and Intact Exudate Amount: Large Exudate Type: Serous Exudate Color: amber Foul Odor After Cleansing: Yes Due to Product Use: No Slough/Fibrino Yes Wound Bed Granulation Amount: Medium (34-66%) Exposed Structure Granulation Quality: Pink Fascia Exposed: No Necrotic Amount: Medium (34-66%) Fat Layer (Subcutaneous Tissue) Exposed: No Necrotic Quality: Adherent Slough Tendon  Exposed: No Muscle Exposed: No Joint Exposed: No Bone Exposed: No Periwound Skin Texture Straker, Brance E. ([id number]) Texture Color No Abnormalities Noted: No No Abnormalities Noted: No Callus: No Atrophie Blanche: No Crepitus: No Cyanosis: No Excoriation: No Ecchymosis: No Induration: No Erythema: No Rash: No Hemosiderin Staining: No Scarring: No Mottled: No Pallor: No Moisture Rubor: No No Abnormalities Noted: No Dry / Scaly: No Temperature / Pain Maceration: No Temperature:  No Abnormality Wound Preparation Ulcer Cleansing: Rinsed/Irrigated with Saline Topical Anesthetic Applied: Other: lidocaine 4%, Electronic Signature(s) Signed: 02/07/2017 2:24:02 PM By: Montey Hora Entered By: Montey Hora on 02/07/2017 14:24:02 Baranek, [name] ([id number]) -------------------------------------------------------------------------------- Vitals Details Patient Name: Haslip, Mujtaba E. Date of Service: 02/07/2017 1:30 PM Medical Record Number: [id number] Patient Account Number: [id number] Date of Birth/Sex: [birth year] (66 y.o. Male) Treating RN: Montey Hora Primary Care Joshual Terrio: Myrtie Hawk Other Clinician: Referring Boris Engelmann: Myrtie Hawk Treating Shaun Runyon/Extender: Frann Rider in Treatment: 19 Vital Signs Time Taken: 14:09 Temperature (F): 98.0 Height (in): 69 Pulse (bpm): 66 Weight (lbs): 168 Respiratory Rate (breaths/min): 18 Body Mass Index (BMI): 24.8 Blood Pressure (mmHg): 142/83 Reference Range: 80 - 120 mg / dl Electronic Signature(s) Signed: 02/07/2017 4:49:35 PM By: Montey Hora Entered By: Montey Hora on 02/07/2017 14:09:49

## 2017-02-11 NOTE — Telephone Encounter (Signed)
-----   Message from Sindy Guadel[place], MD sent at 02/04/2017  9:27 AM EDT ----- Please inform PCP creatinine is getting worse. He may need nephrology referral. I am seeing him on 11/13. I will consder procrit at that time

## 2017-02-14 ENCOUNTER — Encounter: Payer: Medicare HMO | Admitting: Surgery

## 2017-02-14 ENCOUNTER — Encounter (INDEPENDENT_AMBULATORY_CARE_PROVIDER_SITE_OTHER): Payer: Self-pay

## 2017-02-14 DIAGNOSIS — E11621 Type 2 diabetes mellitus with foot ulcer: Secondary | ICD-10-CM | POA: Diagnosis not present

## 2017-02-16 NOTE — Progress Notes (Signed)
MAICO, MULVEHILL ([id number]) Visit Report for 02/14/2017 Chief Complaint Document Details Patient Name: [name], [name] Date of Service: 02/14/2017 9:15 AM Medical Record Number: [id number] Patient Account Number: [id number] Date of Birth/Sex: Mar 23, 1951 (66 y.o. Male) Treating RN: Montey Hora Primary Care Provider: Myrtie Hawk Other Clinician: Referring Provider: Myrtie Hawk Treating Provider/Extender: Frann Rider in Treatment: 20 Information Obtained from: Patient Chief Complaint Patients presents for treatment of an open diabetic ulcer to both feet Electronic Signature(s) Signed: 02/14/2017 10:20:39 AM By: Christin Fudge MD, FACS Entered By: Christin Fudge on 02/14/2017 10:20:39 Hisaw, [name] ([id number]) -------------------------------------------------------------------------------- HPI Details Patient Name: [name], [name] Date of Service: 02/14/2017 9:15 AM Medical Record Number: [id number] Patient Account Number: [id number] Date of Birth/Sex: 1950-04-10 (66 y.o. Male) Treating RN: Montey Hora Primary Care Provider: Myrtie Hawk Other Clinician: Referring Provider: Myrtie Hawk Treating Provider/Extender: Frann Rider in Treatment: 20 History of Present Illness Location: bilateral feet ulceration on the toes Quality: Patient reports experiencing a dull pain to affected area(s). Severity: Patient states wound are getting better Duration: Patient has had the wound for > 3 months prior to seeking treatment at the wound center Timing: Pain in wound is constant (hurts all the time) Context: The wound would happen gradually Modifying Factors: Other treatment(s) tried include:treatment for lymphedema and is seen by the podiatrist Dr. Caryl Comes Associated Signs and Symptoms: Patient reports having increase swelling. HPI Description: 66 year old patient here to see as for bilateral feet ulceration to on his left first and second  toe and 2 on his right first and second toe, which she's had for about 4 months. 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 Donella Stade. The patient is also undergoing workup by medical oncology for a intra-abdominal lymphadenopathy. 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 He is also status post appendectomy, inguinal lymph node biopsy, rectal biopsy and rectal examination under anesthesia. he currently smokes cigarettes about half packet a day. In March of this year he was seen by Dr. Hortencia Pilar, for evaluation of bilateral varicose veins and besides wearing compression stockings he had recommended laser ablation of the right and left great saphenous veins to eleviate the symptoms and complications of severe superficial venous reflux disease. He also recommended lymphedema pumps for better control of his lymphedema. The patient recently has had on 08/23/2016, right greater saphenous vein ablation with the laser energy Earlier lower extremity venous reflux examination done on 05/08/2016 showed no DVT or SVT both lower legs but incompetence of bilateral great saphenous veins was present. A lower arterial study was also done and there was no significant right lower and left lower extremity problems based on a normal toe brachial index bilaterally and the ABI was 1.21 the left and 1.23 on the right. His post ablation venous duplex examination showed successful ablation of the right GS vein with thrombus formation 2 below the right saphenofemoral junction. The deep system was patent without evidence of thrombosis and this was done on 08/30/2016. the patient also has a squamous cell cancer of the skin of the buttock and is recently undergone radiation  therapy for this prior to excisional surgery. Addendum: regarding his x-rays done today and x-ray of the left foot -- IMPRESSION: No objective evidence of osteomyelitis. There are soft tissue  changes which may reflect cellulitis. X-ray of the right foot -- IMPRESSION:Findings compatible with cellulitis of the toes. No objective evidence of osteomyelitis is observed. 10/01/16 on evaluation today patient's wounds appeared to be doing some better. I did review the x-rays as well which showed no evidence of osteomyelitis although there was evidence on x-ray of cellulitis. He fortunately is not having any discomfort although he continues to have some swelling. He does not remember being on any antibiotics recently. 10/15/16 on evaluation today patient's wounds overall appear to be doing better although he does have a new location noted on the left foot. Fortunately he is not having significant pain. It almost has the appearance that something is rubbing on the end of his toes but he wears the open toe shoes and according to what he is telling me never wears anything that would rub on his foot. There is no evidence of infection and specifically no evidence of a fungal infection 10/22/16 On evaluation today patient's wounds appeared to be doing better compared to last week in regard to his bilateral Aston, [name] ([id number]) lower extremities. Fortunately I happy with how things are progressing although he still has ulcers I feel like that he is improving and appropriate manner. 11/12/16 on evaluation today patient appears to be doing well in regard to his bilateral feet and the respective wounds. We have been using surrounding her dressings along with an antifungal cream which seems to be doing very well. He has no bilateral dysfunction noticed that the rituals are weight loss at this point. He also has no nausea or vomiting a note purulent discharge. He did see Vein and vascular today and he tells me that  they told him he could have surgery for his venous stasis but they did not feel like it was worth it in his words. Fortunately patient's wounds do appear to be getting sneakily better. 11/26/2016 -- he says he is going to have some surgery during this week at [facility] for possibly a colon resection. 12/31/2016 -- the patient has been noncompliant with his smoking and I'm not sure whether he is also started drinking again. He continues to be very nonchalant about his care 01/14/2017 -- the patient's HandP has been reviewed well and I understand he is being compliant with trying to give up smoking and his local dressing changes. He does not have any surgical options of 4 to him by his vascular surgeons.he was last seen in early August by Dr. Hortencia Pilar who recommended compression stockings,and possibly lymph pumps in 2-3 months after doing a review ultrasound. 01/28/2017 - the patient did not have any fresh complaints but on examination I noted a large lacerated wound on the plantar aspect of his right fourth toe which had a lot of necrotic debris and it probes down to bone. 02/07/2017 -- x-ray of the right foot -- IMPRESSION: Soft tissue swelling about the first through fourth toes consistent with cellulitis. New destructive change in the tuft of the distal phalanx of the great toe is consistent with osteomyelitis. 02/14/2017 -- the patient's MRI is pending this coming Monday and he still continues to smoke. We have again gone over off loading of his wounds in great detail and he says he's been compliant Electronic Signature(s) Signed: 02/14/2017 10:21:10 AM By: Christin Fudge MD, FACS Entered By: Christin Fudge on 02/14/2017 10:21:09 [name], [name] ([id number]) -------------------------------------------------------------------------------- Physical Exam Details Patient Name: [name], [name] Date of Service: 02/14/2017 9:15 AM Medical Record Number:  [id number] Patient Account Number:  [id number] Date of Birth/Sex: October 25, 1950 (66 y.o. Male) Treating RN: Montey Hora Primary Care Provider: Myrtie Hawk Other Clinician: Referring Provider: Myrtie Hawk Treating Provider/Extender: Frann Rider in Treatment: 20 Constitutional . Pulse regular. Respirations normal and unlabored. Afebrile. . Eyes Nonicteric. Reactive to light. Ears, Nose, Mouth, and Throat Lips, teeth, and gums WNL.Marland Kitchen Moist mucosa without lesions. Neck supple and nontender. No palpable supraclavicular or cervical adenopathy. Normal sized without goiter. Respiratory WNL. No retractions.. Cardiovascular Pedal Pulses WNL. No clubbing, cyanosis or edema. Lymphatic No adneopathy. No adenopathy. No adenopathy. Musculoskeletal Adexa without tenderness or enlargement.. Digits and nails w/o clubbing, cyanosis, infection, petechiae, ischemia, or inflammatory conditions.. Integumentary (Hair, Skin) No suspicious lesions. No crepitus or fluctuance. No peri-wound warmth or erythema. No masses.Marland Kitchen Psychiatric Judgement and insight Intact.. No evidence of depression, anxiety, or agitation.. Notes the right fourth toe looks better today and there was no debris to remove. The rest of the wounds are looking fine and seemed to be in various stages of healing Electronic Signature(s) Signed: 02/14/2017 10:21:56 AM By: Christin Fudge MD, FACS Entered By: Christin Fudge on 02/14/2017 10:21:56 [name], [name] ([id number]) -------------------------------------------------------------------------------- Physician Orders Details Patient Name: [name], [name] Date of Service: 02/14/2017 9:15 AM Medical Record Number: [id number] Patient Account Number: [id number] Date of Birth/Sex: 1950-10-17 (66 y.o. Male) Treating RN: Montey Hora Primary Care Provider: Myrtie Hawk Other Clinician: Referring Provider: Myrtie Hawk Treating Provider/Extender: Frann Rider in Treatment:  20 Verbal / Phone Orders: No Diagnosis Coding Wound Cleansing Wound #1 Right Toe Great o Clean wound with Normal Saline. o May Shower, gently pat wound dry prior to applying new dressing. Wound #10 Right Toe Fourth o Clean wound with Normal Saline. o May Shower, gently pat wound dry prior to applying new dressing. Wound #2 Right Toe Second o Clean wound with Normal Saline. o May Shower, gently pat wound dry prior to applying new dressing. Wound #5 Left Toe Great o Clean wound with Normal Saline. o May Shower, gently pat wound dry prior to applying new dressing. Wound #6 Left Toe Second o Clean wound with Normal Saline. o May Shower, gently pat wound dry prior to applying new dressing. Wound #8 Left Toe Third o Clean wound with Normal Saline. o May Shower, gently pat wound dry prior to applying new dressing. Wound #9 Right Toe Third o Clean wound with Normal Saline. o May Shower, gently pat wound dry prior to applying new dressing. Anesthetic Wound #1 Right Toe Great o Topical Lidocaine 4% cream applied to wound bed prior to debridement Wound #10 Right Toe Fourth o Topical Lidocaine 4% cream applied to wound bed prior to debridement Wound #2 Right Toe Second o Topical Lidocaine 4% cream applied to wound bed prior to debridement Wound #5 Left Toe Great o Topical Lidocaine 4% cream applied to wound bed prior to debridement Wound #6 Left Toe Second o Topical Lidocaine 4% cream applied to wound bed prior to debridement [name], [name] ([id number]) Wound #8 Left Toe Third o Topical Lidocaine 4% cream applied to wound bed prior to debridement Wound #9 Right Toe Third o Topical Lidocaine 4% cream applied to wound bed prior to debridement Primary Wound Dressing Wound #1 Right Toe Great o Silvercel Non-Adherent o Other: - nystatin Wound #10 Right Toe Fourth o [place] Non-Adherent o Other: - nystatin Wound #2 Right Toe  Second o [place]cel Non-Adherent o Other: - nystatin Wound #5 Left Toe Great o [place] Non-Adherent o Other: - nystatin  Wound #6 Left Toe Second o [place]cel Non-Adherent o Other: - nystatin Wound #8 Left Toe Third o [place]cel Non-Adherent o Other: - nystatin Wound #9 Right Toe Third o [place]cel Non-Adherent o Other: - nystatin Secondary Dressing Wound #1 Right Toe Great o Gauze and Kerlix/Conform Wound #10 Right Toe Fourth o Gauze and Kerlix/Conform Wound #2 Right Toe Second o Gauze and Kerlix/Conform Wound #5 Left Toe Great o Gauze and Kerlix/Conform Wound #6 Left Toe Second o Gauze and Kerlix/Conform Wound #8 Left Toe Third o Gauze and Kerlix/Conform Wound #9 Right Toe Third o Gauze and Kerlix/Conform [name], [name] ([id number]) Dressing Change Frequency Wound #1 Right Toe Great o Change dressing every day. Wound #10 Right Toe Fourth o Change dressing every day. Wound #2 Right Toe Second o Change dressing every day. Wound #5 Left Toe Great o Change dressing every day. Wound #6 Left Toe Second o Change dressing every day. Wound #8 Left Toe Third o Change dressing every day. Wound #9 Right Toe Third o Change dressing every day. Follow-up Appointments Wound #1 Right Toe Great o Return Appointment in 1 week. Wound #10 Right Toe Fourth o Return Appointment in 1 week. Wound #2 Right Toe Second o Return Appointment in 1 week. Wound #5 Left Toe Great o Return Appointment in 1 week. Wound #6 Left Toe Second o Return Appointment in 1 week. Wound #8 Left Toe Third o Return Appointment in 1 week. Wound #9 Right Toe Third o Return Appointment in 1 week. Additional Orders / Instructions Wound #1 Right Toe Great o Stop Smoking o Increase protein intake. o Other: - Please add vitamin A, vitamin C and zinc supplements to your diet Wound #10 Right Toe Fourth o Stop Smoking o Increase protein  intake. o Other: - Please add vitamin A, vitamin C and zinc supplements to your diet [name], [name] ([id number]) Wound #2 Right Toe Second o Stop Smoking o Increase protein intake. o Other: - Please add vitamin A, vitamin C and zinc supplements to your diet Wound #5 Left Toe Great o Stop Smoking o Increase protein intake. o Other: - Please add vitamin A, vitamin C and zinc supplements to your diet Wound #6 Left Toe Second o Stop Smoking o Increase protein intake. o Other: - Please add vitamin A, vitamin C and zinc supplements to your diet Wound #8 Left Toe Third o Stop Smoking o Increase protein intake. o Other: - Please add vitamin A, vitamin C and zinc supplements to your diet Wound #9 Right Toe Third o Stop Smoking o Increase protein intake. o Other: - Please add vitamin A, vitamin C and zinc supplements to your diet Electronic Signature(s) Signed: 02/14/2017 12:03:11 PM By: Christin Fudge MD, FACS Signed: 02/14/2017 5:11:26 PM By: Montey Hora Entered By: Montey Hora on 02/14/2017 10:07:56 [name], [name] ([id number]) -------------------------------------------------------------------------------- Problem List Details Patient Name: Ohlinger, Skyy E. Date of Service: 02/14/2017 9:15 AM Medical Record Number: [id number] Patient Account Number: [id number] Date of Birth/Sex: 17-Jul-1950 (66 y.o. Male) Treating RN: Montey Hora Primary Care Provider: Myrtie Hawk Other Clinician: Referring Provider: Myrtie Hawk Treating Provider/Extender: Frann Rider in Treatment: 20 Active Problems ICD-10 Encounter Code Description Active Date Diagnosis E11.621 Type 2 diabetes mellitus with foot ulcer 09/21/2016 Yes I87.313 Chronic venous hypertension (idiopathic) with ulcer of bilateral 09/21/2016 Yes lower extremity I89.0 Lymphedema, not elsewhere classified 09/21/2016 Yes L97.522 Non-pressure chronic ulcer of other part of left  foot with fat layer 09/21/2016 Yes exposed L97.512 Non-pressure chronic ulcer of other part  of right foot with fat layer 09/21/2016 Yes exposed F17.218 Nicotine dependence, cigarettes, with other nicotine-induced 09/21/2016 Yes disorders F10.19 Alcohol abuse with unspecified alcohol-induced disorder 09/21/2016 Yes Inactive Problems Resolved Problems Electronic Signature(s) Signed: 02/14/2017 10:20:26 AM By: Christin Fudge MD, FACS Entered By: Christin Fudge on 02/14/2017 10:20:26 [name], [name] ([id number]) -------------------------------------------------------------------------------- Progress Note Details Patient Name: [name], [name] Date of Service: 02/14/2017 9:15 AM Medical Record Number: [id number] Patient Account Number: [id number] Date of Birth/Sex: 1950-04-30 (66 y.o. Male) Treating RN: Montey Hora Primary Care Provider: Myrtie Hawk Other Clinician: Referring Provider: Myrtie Hawk Treating Provider/Extender: Frann Rider in Treatment: 20 Subjective Chief Complaint Information obtained from Patient Patients presents for treatment of an open diabetic ulcer to both feet History of Present Illness (HPI) The following HPI elements were documented for the patient's wound: Location: bilateral feet ulceration on the toes Quality: Patient reports experiencing a dull pain to affected area(s). Severity: Patient states wound are getting better Duration: Patient has had the wound for > 3 months prior to seeking treatment at the wound center Timing: Pain in wound is constant (hurts all the time) Context: The wound would happen gradually Modifying Factors: Other treatment(s) tried include:treatment for lymphedema and is seen by the podiatrist Dr. Caryl Comes Associated Signs and Symptoms: Patient reports having increase swelling. 66 year old patient here to see as for bilateral feet ulceration to on his left first and second toe and 2 on his right first and second  toe, which she's had for about 4 months. 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 Donella Stade. The patient is also undergoing workup by medical oncology for a intra-abdominal lymphadenopathy. 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 He is also status post appendectomy, inguinal lymph node biopsy, rectal biopsy and rectal examination under anesthesia. he currently smokes cigarettes about half packet a day. In March of this year he was seen by Dr. Hortencia Pilar, for evaluation of bilateral varicose veins and besides wearing compression stockings he had recommended laser ablation of the right and left great saphenous veins to eleviate the symptoms and complications of severe superficial venous reflux disease. He also recommended lymphedema pumps for better control of his lymphedema. The patient recently has had on 08/23/2016, right greater saphenous vein ablation with the laser energy Earlier lower extremity venous reflux examination done on 05/08/2016 showed no DVT or SVT both lower legs but incompetence of bilateral great saphenous veins was present. A lower arterial study was also done and there was no significant right lower and left lower extremity problems based on a normal toe brachial index bilaterally and the ABI was 1.21 the left and 1.23 on the right. His post ablation venous duplex examination showed successful ablation of the right GS vein with thrombus formation 2 below the right saphenofemoral junction. The deep system was patent without evidence of thrombosis and this was done on 08/30/2016. the patient also has a squamous cell cancer of the skin of the buttock and is recently undergone radiation therapy for this prior to excisional  surgery. Addendum: regarding his x-rays done today and x-ray of the left foot -- IMPRESSION: No objective evidence of osteomyelitis. There are soft tissue changes which may reflect cellulitis. X-ray of the right foot -- IMPRESSION:Findings compatible with cellulitis of the toes.  No objective evidence of osteomyelitis is observed. 10/01/16 on evaluation today patient's wounds appeared to be doing some better. I did review the x-rays as well which showed no evidence of osteomyelitis although there was evidence on x-ray of cellulitis. He fortunately is not having any discomfort Boquet, Camdyn E. ([id number]) although he continues to have some swelling. He does not remember being on any antibiotics recently. 10/15/16 on evaluation today patient's wounds overall appear to be doing better although he does have a new location noted on the left foot. Fortunately he is not having significant pain. It almost has the appearance that something is rubbing on the end of his toes but he wears the open toe shoes and according to what he is telling me never wears anything that would rub on his foot. There is no evidence of infection and specifically no evidence of a fungal infection 10/22/16 On evaluation today patient's wounds appeared to be doing better compared to last week in regard to his bilateral lower extremities. Fortunately I happy with how things are progressing although he still has ulcers I feel like that he is improving and appropriate manner. 11/12/16 on evaluation today patient appears to be doing well in regard to his bilateral feet and the respective wounds. We have been using surrounding her dressings along with an antifungal cream which seems to be doing very well. He has no bilateral dysfunction noticed that the rituals are weight loss at this point. He also has no nausea or vomiting a note purulent discharge. He did see Vein and vascular today and he tells me that they told him he could have surgery  for his venous stasis but they did not feel like it was worth it in his words. Fortunately patient's wounds do appear to be getting sneakily better. 11/26/2016 -- he says he is going to have some surgery during this week at [facility] for possibly a colon resection. 12/31/2016 -- the patient has been noncompliant with his smoking and I'm not sure whether he is also started drinking again. He continues to be very nonchalant about his care 01/14/2017 -- the patient's HandP has been reviewed well and I understand he is being compliant with trying to give up smoking and his local dressing changes. He does not have any surgical options of 4 to him by his vascular surgeons.he was last seen in early August by Dr. Hortencia Pilar who recommended compression stockings,and possibly lymph pumps in 2-3 months after doing a review ultrasound. 01/28/2017 - the patient did not have any fresh complaints but on examination I noted a large lacerated wound on the plantar aspect of his right fourth toe which had a lot of necrotic debris and it probes down to bone. 02/07/2017 -- x-ray of the right foot -- IMPRESSION: Soft tissue swelling about the first through fourth toes consistent with cellulitis. New destructive change in the tuft of the distal phalanx of the great toe is consistent with osteomyelitis. 02/14/2017 -- the patient's MRI is pending this coming Monday and he still continues to smoke. We have again gone over off loading of his wounds in great detail and he says he's been compliant Patient History Information obtained from Patient. Social History Current every day smoker, Marital Status - Widowed, Alcohol Use - Daily - quit drinking about a week ago, Drug Use - No History, Caffeine Use - Moderate. Medical And Surgical History Notes Oncologic 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 Objective [name], Linas E. ([id number]) Constitutional  Pulse regular. Respirations normal and  unlabored. Afebrile. Vitals Time Taken: 9:28 AM, Height: 69 in, Weight: 168 lbs, BMI: 24.8, Temperature: 98.1 F, Pulse: 67 bpm, Respiratory Rate: 18 breaths/min, Blood Pressure: 194/82 mmHg. General Notes: Retook BP manually 178/82. Made Dr. Con Memos aware. Eyes Nonicteric. Reactive to light. Ears, Nose, Mouth, and Throat Lips, teeth, and gums WNL.Marland Kitchen Moist mucosa without lesions. Neck supple and nontender. No palpable supraclavicular or cervical adenopathy. Normal sized without goiter. Respiratory WNL. No retractions.. Cardiovascular Pedal Pulses WNL. No clubbing, cyanosis or edema. Lymphatic No adneopathy. No adenopathy. No adenopathy. Musculoskeletal Adexa without tenderness or enlargement.. Digits and nails w/o clubbing, cyanosis, infection, petechiae, ischemia, or inflammatory conditions.Marland Kitchen Psychiatric Judgement and insight Intact.. No evidence of depression, anxiety, or agitation.. General Notes: the right fourth toe looks better today and there was no debris to remove. The rest of the wounds are looking fine and seemed to be in various stages of healing Integumentary (Hair, Skin) No suspicious lesions. No crepitus or fluctuance. No peri-wound warmth or erythema. No masses.. Wound #1 status is Open. Original cause of wound was Gradually Appeared. The wound is located on the Right Toe Great. The wound measures 1.8cm length x 1.6cm width x 0.1cm depth; 2.262cm^2 area and 0.226cm^3 volume. There is Fat Layer (Subcutaneous Tissue) Exposed exposed. There is no tunneling or undermining noted. There is a large amount of serosanguineous drainage noted. Foul odor after cleansing was noted. The wound margin is flat and intact. There is medium (34-66%) pink granulation within the wound bed. There is a medium (34-66%) amount of necrotic tissue within the wound bed including Adherent Slough. The periwound skin appearance exhibited: Excoriation, Maceration. The periwound skin appearance did not  exhibit: Callus, Crepitus, Induration, Rash, Scarring, Dry/Scaly, Atrophie Blanche, Cyanosis, Ecchymosis, Hemosiderin Staining, Mottled, Pallor, Rubor, Erythema. Periwound temperature was noted as No Abnormality. Wound #10 status is Open. Original cause of wound was Not Known. The wound is located on the Right Toe Fourth. The wound measures 0.2cm length x 1.3cm width x 0.7cm depth; 0.204cm^2 area and 0.143cm^3 volume. There is Fat Layer (Subcutaneous Tissue) Exposed exposed. There is no tunneling or undermining noted. There is a large amount of serous drainage noted. Foul odor after cleansing was noted. The wound margin is flat and intact. There is small (1-33%) red granulation within the wound bed. There is a large (67-100%) amount of necrotic tissue within the wound bed including Eschar and Adherent Slough. The periwound skin appearance exhibited: Maceration. The periwound skin appearance did not exhibit: Callus, Crepitus, Excoriation, Induration, Rash, Scarring, Dry/Scaly, Atrophie Blanche, Cyanosis, Ecchymosis, Hemosiderin Staining, Mottled, Pallor, Rubor, Erythema. Periwound temperature was noted as No Abnormality. Wound #2 status is Open. Original cause of wound was Gradually Appeared. The wound is located on the Right Toe Second. Pruss, Lavonte E. ([id number]) The wound measures 0.2cm length x 0.2cm width x 0.1cm depth; 0.031cm^2 area and 0.003cm^3 volume. There is Fat Layer (Subcutaneous Tissue) Exposed exposed. There is no tunneling or undermining noted. There is a large amount of serosanguineous drainage noted. Foul odor after cleansing was noted. The wound margin is flat and intact. There is medium (34-66%) pink granulation within the wound bed. There is a medium (34-66%) amount of necrotic tissue within the wound bed including Adherent Slough. The periwound skin appearance exhibited: Excoriation, Maceration. The periwound skin appearance did not exhibit: Callus, Crepitus, Induration, Rash,  Scarring, Dry/Scaly, Atrophie Blanche, Cyanosis, Ecchymosis, Hemosiderin Staining, Mottled, Pallor, Rubor, Erythema. Periwound temperature was noted as No Abnormality. Wound #5 status  is Open. Original cause of wound was Gradually Appeared. The wound is located on the Left Toe Great. The wound measures 1.5cm length x 2cm width x 0.1cm depth; 2.356cm^2 area and 0.236cm^3 volume. There is Fat Layer (Subcutaneous Tissue) Exposed exposed. There is no tunneling or undermining noted. There is a large amount of serous drainage noted. Foul odor after cleansing was noted. The wound margin is flat and intact. There is medium (34-66%) pink granulation within the wound bed. There is a medium (34-66%) amount of necrotic tissue within the wound bed including Adherent Slough. The periwound skin appearance exhibited: Excoriation, Maceration. The periwound skin appearance did not exhibit: Callus, Crepitus, Induration, Rash, Scarring, Dry/Scaly, Atrophie Blanche, Cyanosis, Ecchymosis, Hemosiderin Staining, Mottled, Pallor, Rubor, Erythema. Periwound temperature was noted as No Abnormality. Wound #6 status is Open. Original cause of wound was Gradually Appeared. The wound is located on the Left Toe Second. The wound measures 1.6cm length x 0.6cm width x 0.1cm depth; 0.754cm^2 area and 0.075cm^3 volume. There is Fat Layer (Subcutaneous Tissue) Exposed exposed. There is no tunneling or undermining noted. There is a large amount of serous drainage noted. Foul odor after cleansing was noted. The wound margin is flat and intact. There is medium (34-66%) pink granulation within the wound bed. There is a medium (34-66%) amount of necrotic tissue within the wound bed including Adherent Slough. The periwound skin appearance exhibited: Excoriation, Maceration. The periwound skin appearance did not exhibit: Callus, Crepitus, Induration, Rash, Scarring, Dry/Scaly, Atrophie Blanche, Cyanosis, Ecchymosis, Hemosiderin Staining,  Mottled, Pallor, Rubor, Erythema. Periwound temperature was noted as No Abnormality. Wound #8 status is Open. Original cause of wound was Gradually Appeared. The wound is located on the Left Toe Third. The wound measures 0.5cm length x 0.4cm width x 0.1cm depth; 0.157cm^2 area and 0.016cm^3 volume. There is no tunneling or undermining noted. There is a large amount of serosanguineous drainage noted. The wound margin is distinct with the outline attached to the wound base. There is medium (34-66%) red granulation within the wound bed. There is a medium (34- 66%) amount of necrotic tissue within the wound bed including Adherent Slough. The periwound skin appearance exhibited: Maceration. Periwound temperature was noted as No Abnormality. The periwound has tenderness on palpation. Wound #9 status is Open. Original cause of wound was Gradually Appeared. The wound is located on the Right Toe Third. The wound measures 0.6cm length x 0.5cm width x 0.1cm depth; 0.236cm^2 area and 0.024cm^3 volume. There is no tunneling or undermining noted. There is a large amount of serous drainage noted. Foul odor after cleansing was noted. The wound margin is flat and intact. There is medium (34-66%) pink granulation within the wound bed. There is a medium (34- 66%) amount of necrotic tissue within the wound bed including Adherent Slough. The periwound skin appearance exhibited: Maceration. The periwound skin appearance did not exhibit: Callus, Crepitus, Excoriation, Induration, Rash, Scarring, Dry/Scaly, Atrophie Blanche, Cyanosis, Ecchymosis, Hemosiderin Staining, Mottled, Pallor, Rubor, Erythema. Periwound temperature was noted as No Abnormality. 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 ([id number]) Plan Wound Cleansing:  Wound #1 Right Toe Great: Clean wound with Normal Saline. May Shower, gently pat wound dry prior to applying new dressing. Wound #10 Right Toe Fourth: Clean wound with Normal Saline. May Shower, gently pat wound dry prior to applying new dressing. Wound #2 Right Toe Second: Clean wound with Normal Saline. May Shower, gently pat wound dry prior to applying new dressing. Wound #5 Left Toe Great: Clean wound with Normal Saline. May Shower, gently pat wound dry prior to applying new dressing. Wound #6 Left Toe Second: Clean wound with Normal Saline. May Shower, gently pat wound dry prior to applying new dressing. Wound #8 Left Toe Third: Clean wound with Normal Saline. May Shower, gently pat wound dry prior to applying new dressing. Wound #9 Right Toe Third: Clean wound with Normal Saline. May Shower, gently pat wound dry prior to applying new dressing. Anesthetic: Wound #1 Right Toe Great: Topical Lidocaine 4% cream applied to wound bed prior to debridement Wound #10 Right Toe Fourth: Topical Lidocaine 4% cream applied to wound bed prior to debridement Wound #2 Right Toe Second: Topical Lidocaine 4% cream applied to wound bed prior to debridement Wound #5 Left Toe Great: Topical Lidocaine 4% cream applied to wound bed prior to debridement Wound #6 Left Toe Second: Topical Lidocaine 4% cream applied to wound bed prior to debridement Wound #8 Left Toe Third: Topical Lidocaine 4% cream applied to wound bed prior to debridement Wound #9 Right Toe Third: Topical Lidocaine 4% cream applied to wound bed prior to debridement Primary Wound Dressing: Wound #1 Right Toe  Great: Silvercel Non-Adherent Other: - nystatin Wound #10 Right Toe Fourth: Silvercel Non-Adherent Other: - nystatin Wound #2 Right Toe Second: Silvercel Non-Adherent Other: - nystatin Wound #5 Left Toe Great: Silvercel Non-Adherent Other: - nystatin Wound #6 Left Toe Second: Silvercel Non-Adherent Other: - nystatin Wound #8 Left Toe Third: PAOLA, FLYNT ([id number]) Silvercel Non-Adherent Other: - nystatin Wound #9 Right Toe Third: Silvercel Non-Adherent Other: - nystatin Secondary Dressing: Wound #1 Right Toe Great: Gauze and Kerlix/Conform Wound #10 Right Toe Fourth: Gauze and Kerlix/Conform Wound #2 Right Toe Second: Gauze and Kerlix/Conform Wound #5 Left Toe Great: Gauze and Kerlix/Conform Wound #6 Left Toe Second: Gauze and Kerlix/Conform Wound #8 Left Toe Third: Gauze and Kerlix/Conform Wound #9 Right Toe Third: Gauze and Kerlix/Conform Dressing Change Frequency: Wound #1 Right Toe Great: Change dressing every day. Wound #10 Right Toe Fourth: Change dressing every day. Wound #2 Right Toe Second: Change dressing every day. Wound #5 Left Toe Great: Change dressing every day. Wound #6 Left Toe Second: Change dressing every day. Wound #8 Left Toe Third: Change dressing every day. Wound #9 Right Toe Third: Change dressing every day. Follow-up Appointments: Wound #1 Right Toe Great: Return Appointment in 1 week. Wound #10 Right Toe Fourth: Return Appointment in 1 week. Wound #2 Right Toe Second: Return Appointment in 1 week. Wound #5 Left Toe Great: Return Appointment in 1 week. Wound #6 Left Toe Second: Return Appointment in 1 week. Wound #8 Left Toe Third: Return Appointment in 1 week. Wound #9 Right Toe Third: Return Appointment in 1 week. Additional Orders / Instructions: Wound #1 Right Toe Great: Stop Smoking Increase protein intake. Other: - Please add vitamin A, vitamin C and zinc supplements to your diet Wound #10 Right Toe  Fourth: Stop Smoking Increase protein intake. Other: - Please add vitamin A, vitamin C and zinc supplements to your diet Wound #2 Right Toe SecondQUANELL, LOUGHNEY E. ([id number]) Stop Smoking Increase protein  intake. Other: - Please add vitamin A, vitamin C and zinc supplements to your diet Wound #5 Left Toe Great: Stop Smoking Increase protein intake. Other: - Please add vitamin A, vitamin C and zinc supplements to your diet Wound #6 Left Toe Second: Stop Smoking Increase protein intake. Other: - Please add vitamin A, vitamin C and zinc supplements to your diet Wound #8 Left Toe Third: Stop Smoking Increase protein intake. Other: - Please add vitamin A, vitamin C and zinc supplements to your diet Wound #9 Right Toe Third: Stop Smoking Increase protein intake. Other: - Please add vitamin A, vitamin C and zinc supplements to your diet I have recommended 1. packing this with Silver alginate and an appropriate foam off loading. 2. x-ray of his right foot with emphasis on the right fourth toe Is suspicious of the first toe having osteomyelitis and hence we will do an MRI -- pending this coming Monday 3. the patient is due to have a vascular opinion soon regarding his studies status post endovenous ablation. He may also benefit from a lymph pump at that time. We will await his vascular opinion soon. I have also asked him to continue local care with antifungal ointment between his toes and silver alginate dressing to the open areas on the left foot more than right foot. 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 Date of Service: 02/14/2017 9:15 AM Medical Record Number:  [id number] Patient Account Number: [id number] Date of Birth/Sex: 1950/08/14 (66 y.o. Male) Treating RN: Montey Hora Primary Care Provider: Myrtie Hawk Other Clinician: Referring Provider: Myrtie Hawk Treating Provider/Extender: Frann Rider in Treatment: 20 Information Obtained From Patient Wound History Do you currently have one or more open woundso Yes How many open wounds do you currently haveo 6 Approximately how long have you had your woundso 3 months How have you been treating your wound(s) until nowo ointment and bandage Has your wound(s) ever healed and then re-openedo No Have you had any lab work done in the past montho No Have you tested positive for an antibiotic resistant organism (MRSA, VRE)o No Have you tested positive for osteomyelitis (bone infection)o No Have you had any tests for circulation on your legso Yes Who ordered the testo PCP Where was the test doneo [facility] Medical History: Negative for: Cataracts; Glaucoma; Optic Neuritis Ear/Nose/Mouth/Throat Medical History: Negative for: Chronic sinus problems/congestion; Middle ear problems Hematologic/Lymphatic Medical History: Positive for: Anemia; Lymphedema Negative for: Hemophilia; Human Immunodeficiency Virus; Sickle Cell Disease Respiratory Medical History: Negative for: Aspiration; Asthma; Chronic Obstructive Pulmonary Disease (COPD); Pneumothorax; Sleep Apnea; Tuberculosis Cardiovascular Medical History: Positive for: Congestive Heart Failure; Hypertension; Peripheral Venous Disease Negative for: Angina; Arrhythmia; Coronary Artery Disease; Deep Vein Thrombosis; Hypotension; Myocardial Infarction; Peripheral Arterial Disease; Phlebitis; Vasculitis Gastrointestinal Medical History: Negative for: Cirrhosis ; Colitis; Crohnos; Hepatitis A; Hepatitis B; Hepatitis C Puckett, Caydon E. ([id number]) Endocrine Medical History: Positive for: Type II Diabetes Treated with: Oral  agents Blood sugar tested every day: Yes Tested : QD Genitourinary Medical History: Negative for: End Stage Renal Disease Immunological Medical History: Negative for: Lupus Erythematosus; Raynaudos; Scleroderma Integumentary (Skin) Medical History: Negative for: History of Burn; History of pressure wounds Musculoskeletal Medical History: Negative for: Gout; Rheumatoid Arthritis; Osteoarthritis; Osteomyelitis Neurologic Medical History: Positive for: Neuropathy Negative for: Dementia; Quadriplegia;  Paraplegia; Seizure Disorder Oncologic Medical History: Past Medical History Notes: 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 Electronic Signature(s) Signed: 02/14/2017 12:03:11 PM By: Christin Fudge MD, FACS Signed: 02/14/2017 5:11:26 PM By: Deniece Ree, [name] ([id number]) Entered By: Christin Fudge on 02/14/2017 10:21:21 Charney, [name] ([id number]) -------------------------------------------------------------------------------- SuperBill Details Patient Name: Pacitti, Fin E. Date of Service: 02/14/2017 Medical Record Number: [id number] Patient Account Number: [id number] Date of Birth/Sex: January 22, 1951 (66 y.o. Male) Treating RN: Montey Hora Primary Care Provider: Myrtie Hawk Other Clinician: Referring Provider: Myrtie Hawk Treating Provider/Extender: Frann Rider in Treatment: 20 Diagnosis Coding ICD-10 Codes Code Description E11.621 Type 2 diabetes mellitus with foot ulcer I87.313 Chronic venous hypertension (idiopathic) with ulcer of bilateral lower extremity I89.0 Lymphedema, not elsewhere classified L97.522 Non-pressure chronic ulcer of other part of left foot with fat layer exposed L97.512 Non-pressure chronic ulcer of other part of right foot with fat layer exposed F17.218 Nicotine dependence, cigarettes, 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 abuse with unspecified alcohol-induced disorder Facility Procedures CPT4 Code: [id number] Description: 96295 - WOUND CARE VISIT-LEV 5 EST PT Modifier: Quantity: 1 Physician Procedures CPT4 Code Description: [id number] 40102 - WC PHYS LEVEL 3 - EST PT ICD-10 Diagnosis Description E11.621 Type 2 diabetes mellitus with foot ulcer I87.313 Chronic venous hypertension (idiopathic) with ulcer of bilateral I89.0 Lymphedema, not elsewhere  classified L97.512 Non-pressure chronic ulcer of other part of right foot with fat Modifier: lower extremi layer exposed Quantity: 1 ty Electronic Signature(s) Signed: 02/14/2017 12:03:11 PM By: Christin Fudge MD, FACS Signed: 02/14/2017 5:11:26 PM By: Montey Hora Previous Signature: 02/14/2017 10:27:33 AM Version By: Christin Fudge MD, FACS Entered By: Montey Hora on 02/14/2017 11:31:42

## 2017-02-16 NOTE — Progress Notes (Signed)
[name], [name] ([id number]) Visit Report for 02/14/2017 Arrival Information Details Patient Name: [name], [name] Date of Service: 02/14/2017 9:15 AM Medical Record Number: [id number] Patient Account Number: [id number] Date of Birth/Sex: [birth year] (66 y.o. Male) Treating RN: Montey Hora Primary Care Inioluwa Boulay: Myrtie Hawk Other Clinician: Referring Antawn Sison: Myrtie Hawk Treating Shaylynn Nulty/Extender: Frann Rider in Treatment: 20 Visit Information History Since Last Visit All ordered tests and consults were completed: No Patient Arrived: Ambulatory Added or deleted any medications: No Arrival Time: 09:27 Any new allergies or adverse reactions: No Accompanied By: family Had a fall or experienced change in No Transfer Assistance: None activities of daily living that may affect Patient Identification Verified: Yes risk of falls: Secondary Verification Process Completed: Yes Signs or symptoms of abuse/neglect since last visito No Patient Requires Transmission-Based No Hospitalized since last visit: No Precautions: Has Dressing in Place as Prescribed: Yes Patient Has Alerts: Yes Pain Present Now: No Patient Alerts: DMII Electronic Signature(s) Signed: 02/14/2017 5:11:26 PM By: Montey Hora Entered By: Montey Hora on 02/14/2017 09:28:18 Goshert, [name] ([id number]) -------------------------------------------------------------------------------- Clinic Level of Care Assessment Details Patient Name: [name], Najae E. Date of Service: 02/14/2017 9:15 AM Medical Record Number: [id number] Patient Account Number: [id number] Date of Birth/Sex: [birth year] (66 y.o. Male) Treating RN: Montey Hora Primary Care Kedarius Aloisi: Myrtie Hawk Other Clinician: Referring Mattheu Brodersen: Myrtie Hawk Treating Sahib Pella/Extender: Frann Rider in Treatment: 20 Clinic Level of Care Assessment Items TOOL 4 Quantity Score []  - Use when only an EandM is  performed on FOLLOW-UP visit 0 ASSESSMENTS - Nursing Assessment / Reassessment X - Reassessment of Co-morbidities (includes updates in patient status) 1 10 X- 1 5 Reassessment of Adherence to Treatment Plan ASSESSMENTS - Wound and Skin Assessment / Reassessment []  - Simple Wound Assessment / Reassessment - one wound 0 X- 7 5 Complex Wound Assessment / Reassessment - multiple wounds []  - 0 Dermatologic / Skin Assessment (not related to wound area) ASSESSMENTS - Focused Assessment []  - Circumferential Edema Measurements - multi extremities 0 []  - 0 Nutritional Assessment / Counseling / Intervention X- 1 5 Lower Extremity Assessment (monofilament, tuning fork, pulses) []  - 0 Peripheral Arterial Disease Assessment (using hand held doppler) ASSESSMENTS - Ostomy and/or Continence Assessment and Care []  - Incontinence Assessment and Management 0 []  - 0 Ostomy Care Assessment and Management (repouching, etc.) PROCESS - Coordination of Care X - Simple Patient / Family Education for ongoing care 1 15 []  - 0 Complex (extensive) Patient / Family Education for ongoing care []  - 0 Staff obtains Programmer, systems, Records, Test Results / Process Orders []  - 0 Staff telephones HHA, Nursing Homes / Clarify orders / etc []  - 0 Routine Transfer to another Facility (non-emergent condition) []  - 0 Routine Hospital Admission (non-emergent condition) []  - 0 New Admissions / Biomedical engineer / Ordering NPWT, Apligraf, etc. []  - 0 Emergency Hospital Admission (emergent condition) X- 1 10 Simple Discharge Coordination Ferdig, Desiderio E. ([id number]) []  - 0 Complex (extensive) Discharge Coordination PROCESS - Special Needs []  - Pediatric / Minor Patient Management 0 []  - 0 Isolation Patient Management []  - 0 Hearing / Language / Visual special needs []  - 0 Assessment of Community assistance (transportation, D/C planning, etc.) []  - 0 Additional assistance / Altered mentation []  - 0 Support  Surface(s) Assessment (bed, cushion, seat, etc.) INTERVENTIONS - Wound Cleansing / Measurement []  - Simple Wound Cleansing - one wound 0 X- 7 5 Complex Wound Cleansing - multiple wounds X- 1 5 Wound Imaging (photographs - any  number of wounds) []  - 0 Wound Tracing (instead of photographs) []  - 0 Simple Wound Measurement - one wound X- 7 5 Complex Wound Measurement - multiple wounds INTERVENTIONS - Wound Dressings X - Small Wound Dressing one or multiple wounds 7 10 []  - 0 Medium Wound Dressing one or multiple wounds []  - 0 Large Wound Dressing one or multiple wounds []  - 0 Application of Medications - topical []  - 0 Application of Medications - injection INTERVENTIONS - Miscellaneous []  - External ear exam 0 []  - 0 Specimen Collection (cultures, biopsies, blood, body fluids, etc.) []  - 0 Specimen(s) / Culture(s) sent or taken to Lab for analysis []  - 0 Patient Transfer (multiple staff / Civil Service fast streamer / Similar devices) []  - 0 Simple Staple / Suture removal (25 or less) []  - 0 Complex Staple / Suture removal (26 or more) []  - 0 Hypo / Hyperglycemic Management (close monitor of Blood Glucose) []  - 0 Ankle / Brachial Index (ABI) - do not check if billed separately X- 1 5 Vital Signs Mandell, Gael E. ([id number]) Has the patient been seen at the hospital within the last three years: Yes Total Score: 230 Level Of Care: New/Established - Level 5 Electronic Signature(s) Signed: 02/14/2017 5:11:26 PM By: Montey Hora Entered By: Montey Hora on 02/14/2017 11:31:29 Coaxum, [name] ([id number]) -------------------------------------------------------------------------------- Encounter Discharge Information Details Patient Name: [name], Orien E. Date of Service: 02/14/2017 9:15 AM Medical Record Number: [id number] Patient Account Number: [id number] Date of Birth/Sex: 06-08-1950 (66 y.o. Male) Treating RN: Montey Hora Primary Care Liboria Putnam: Myrtie Hawk Other  Clinician: Referring Zayonna Ayuso: Myrtie Hawk Treating Jora Galluzzo/Extender: Frann Rider in Treatment: 20 Encounter Discharge Information Items Discharge Pain Level: 0 Discharge Condition: Stable Ambulatory Status: Ambulatory Discharge Destination: Home Transportation: Private Auto Accompanied By: brother Schedule Follow-up Appointment: Yes Medication Reconciliation completed and No provided to Patient/Care Climmie Cronce: Provided on Clinical Summary of Care: 02/14/2017 Form Type Recipient Paper Patient ED Electronic Signature(s) Signed: 02/14/2017 5:11:26 PM By: Montey Hora Entered By: Montey Hora on 02/14/2017 11:33:27 Kutch, [name] ([id number]) -------------------------------------------------------------------------------- Lower Extremity Assessment Details Patient Name: Poudrier, Liam E. Date of Service: 02/14/2017 9:15 AM Medical Record Number: [id number] Patient Account Number: [id number] Date of Birth/Sex: 31-Aug-1950 (66 y.o. Male) Treating RN: Montey Hora Primary Care Yajayra Feldt: Myrtie Hawk Other Clinician: Referring Antolin Belsito: Myrtie Hawk Treating Aviyana Sonntag/Extender: Frann Rider in Treatment: 20 Vascular Assessment Pulses: Dorsalis Pedis Palpable: [Left:Yes] [Right:Yes] Posterior Tibial Extremity colors, hair growth, and conditions: Extremity Color: [Left:Normal] [Right:Normal] Temperature of Extremity: [Left:Warm] [Right:Warm] Capillary Refill: [Left:< 3 seconds] [Right:< 3 seconds] Electronic Signature(s) Signed: 02/14/2017 5:11:26 PM By: Montey Hora Entered By: Montey Hora on 02/14/2017 09:39:36 Sear, [name] ([id number]) -------------------------------------------------------------------------------- Multi Wound Chart Details Patient Name: Muldrow, Paulino E. Date of Service: 02/14/2017 9:15 AM Medical Record Number: [id number] Patient Account Number: [id number] Date of Birth/Sex: 1950-08-20 (66 y.o.  Male) Treating RN: Montey Hora Primary Care Laren Whaling: Myrtie Hawk Other Clinician: Referring Erich Kochan: Myrtie Hawk Treating Malisa Ruggiero/Extender: Frann Rider in Treatment: 20 Vital Signs Height(in): 72 Pulse(bpm): 54 Weight(lbs): 168 Blood Pressure(mmHg): 194/82 Body Mass Index(BMI): 25 Temperature(F): 98.1 Respiratory Rate 18 (breaths/min): Photos: [1:No Photos] [10:No Photos] [2:No Photos] Wound Location: [1:Right Toe Great] [10:Right Toe Fourth] [2:Right Toe Second] Wounding Event: [1:Gradually Appeared] [10:Not Known] [2:Gradually Appeared] Primary Etiology: [1:Diabetic Wound/Ulcer of the Lower Extremity] [10:Diabetic Wound/Ulcer of the Lower Extremity] [2:Diabetic Wound/Ulcer of the Lower Extremity] Comorbid History: [1:Anemia, Lymphedema, Congestive Heart Failure, Hypertension, Peripheral Venous Disease, Type II Diabetes, Neuropathy] [10:Anemia, Lymphedema, Congestive Heart  Failure, Hypertension, Peripheral Venous Disease, Type II Diabetes,  Neuropathy] [2:Anemia, Lymphedema, Congestive Heart Failure, Hypertension, Peripheral Venous Disease, Type II Diabetes, Neuropathy] Date Acquired: [1:06/11/2016] [10:01/28/2017] [2:06/11/2016] Weeks of Treatment: [1:20] [10:2] [2:20] Wound Status: [1:Open] [10:Open] [2:Open] Pending Amputation on [1:Yes] [10:No] [2:Yes] Presentation: Measurements L x W x D [1:1.8x1.6x0.1] [10:0.2x1.3x0.7] [2:0.2x0.2x0.1] (cm) Area (cm) : [1:2.262] [10:0.204] [2:0.031] Volume (cm) : [1:0.226] [10:0.143] [2:0.003] % Reduction in Area: [1:95.20%] [10:81.50%] [2:99.00%] % Reduction in Volume: [1:95.20%] [10:85.60%] [2:99.00%] Classification: [1:Grade 1] [10:Grade 2] [2:Grade 1] Exudate Amount: [1:Large] [10:Large] [2:Large] Exudate Type: [1:Serosanguineous] [10:Serous] [2:Serosanguineous] Exudate Color: [1:red, brown] [10:amber] [2:red, brown] Foul Odor After Cleansing: [1:Yes] [10:Yes] [2:Yes] Odor Anticipated Due to [1:No]  [10:No] [2:No] Product Use: Wound Margin: [1:Flat and Intact] [10:Flat and Intact] [2:Flat and Intact] Granulation Amount: [1:Medium (34-66%)] [10:Small (1-33%)] [2:Medium (34-66%)] Granulation Quality: [1:Pink] [10:Red] [2:Pink] Necrotic Amount: [1:Medium (34-66%)] [10:Large (67-100%)] [2:Medium (34-66%)] Necrotic Tissue: [1:Adherent Slough] [10:Eschar, Adherent Slough] [2:Adherent Slough] Exposed Structures: [1:Fat Layer (Subcutaneous Tissue) Exposed: Yes Fascia: No Tendon: No] [10:Fat Layer (Subcutaneous Tissue) Exposed: Yes Fascia: No Tendon: No] [2:Fat Layer (Subcutaneous Tissue) Exposed: Yes Fascia: No Tendon: No] Muscle: No Muscle: No Muscle: No Joint: No Joint: No Joint: No Bone: No Bone: No Bone: No Epithelialization: Small (1-33%) None Small (1-33%) Periwound Skin Texture: Excoriation: Yes Excoriation: No Excoriation: Yes Induration: No Induration: No Induration: No Callus: No Callus: No Callus: No Crepitus: No Crepitus: No Crepitus: No Rash: No Rash: No Rash: No Scarring: No Scarring: No Scarring: No Periwound Skin Moisture: Maceration: Yes Maceration: Yes Maceration: Yes Dry/Scaly: No Dry/Scaly: No Dry/Scaly: No Periwound Skin Color: Atrophie Blanche: No Atrophie Blanche: No Atrophie Blanche: No Cyanosis: No Cyanosis: No Cyanosis: No Ecchymosis: No Ecchymosis: No Ecchymosis: No Erythema: No Erythema: No Erythema: No Hemosiderin Staining: No Hemosiderin Staining: No Hemosiderin Staining: No Mottled: No Mottled: No Mottled: No Pallor: No Pallor: No Pallor: No Rubor: No Rubor: No Rubor: No Temperature: No Abnormality No Abnormality No Abnormality Tenderness on Palpation: No No No Wound Preparation: Ulcer Cleansing: Ulcer Cleansing: Ulcer Cleansing: Rinsed/Irrigated with Saline Rinsed/Irrigated with Saline Rinsed/Irrigated with Saline Topical Anesthetic Applied: Topical Anesthetic Applied: Topical Anesthetic Applied: Other:  lidocaine 4% Other: lidocaine 4% Other: lidocaine 4% Wound Number: [id number]  Photos: No Photos No Photos No Photos Wound Location: Left Toe Great Left Toe Second Left Toe Third Wounding Event: Gradually Appeared Gradually Appeared Gradually Appeared Primary Etiology: Diabetic Wound/Ulcer of 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 Lower Extremity Lower Extremity Comorbid History: Anemia, Lymphedema, Anemia, Lymphedema, Anemia, Lymphedema, Congestive Heart Failure, Congestive Heart Failure, Congestive Heart Failure, Hypertension, Peripheral Hypertension, Peripheral Hypertension, Peripheral Venous Disease, Type II Venous Disease, Type II Venous Disease, Type II Diabetes, Neuropathy Diabetes, Neuropathy Diabetes, Neuropathy Date Acquired: 06/11/2016 06/11/2016 12/30/2016 Weeks of Treatment: 20 20 6  Wound Status: Open Open Open Pending Amputation on Yes Yes No Presentation: Measurements L x W x D 1.5x2x0.1 1.6x0.6x0.1 0.5x0.4x0.1 (cm) Area (cm) : 2.356 0.754 0.157 Volume (cm) : 0.236 0.075 0.016 % Reduction in Area: 83.50% 64.70% 80.40% % Reduction in Volume: 83.50% 65.00% 80.00% Classification: Grade 1 Grade 1 Grade 1 Exudate Amount: Large Large Large Exudate Type: Serous Serous Serosanguineous Exudate Color: amber amber red, brown Foul Odor After Cleansing: Yes Yes No Odor Anticipated Due to No No N/A Product Use: Wound Margin: Flat and Intact Flat and Intact Distinct, outline attached Timpone, Dougles E. ([id number]) Granulation Amount: Medium (34-66%) Medium (34-66%) Medium (34-66%) Granulation Quality: Pink Pink Red Necrotic Amount:  Medium (34-66%) Medium (34-66%) Medium (34-66%) Necrotic Tissue: Adherent [place] Exposed Structures: Fat Layer (Subcutaneous Fat Layer (Subcutaneous N/A Tissue) Exposed: Yes Tissue) Exposed: Yes Fascia: No Fascia: No Tendon: No Tendon: No Muscle: No Muscle: No Joint: No Joint:  No Bone: No Bone: No Epithelialization: Small (1-33%) Small (1-33%) None Periwound Skin Texture: Excoriation: Yes Excoriation: Yes No Abnormalities Noted Induration: No Induration: No Callus: No Callus: No Crepitus: No Crepitus: No Rash: No Rash: No Scarring: No Scarring: No Periwound Skin Moisture: Maceration: Yes Maceration: Yes Maceration: Yes Dry/Scaly: No Dry/Scaly: No Periwound Skin Color: Atrophie Blanche: No Atrophie Blanche: No No Abnormalities Noted Cyanosis: No Cyanosis: No Ecchymosis: No Ecchymosis: No Erythema: No Erythema: No Hemosiderin Staining: No Hemosiderin Staining: No Mottled: No Mottled: No Pallor: No Pallor: No Rubor: No Rubor: No Temperature: No Abnormality No Abnormality No Abnormality Tenderness on Palpation: No No Yes Wound Preparation: Ulcer Cleansing: Ulcer Cleansing: Ulcer Cleansing: Rinsed/Irrigated with Saline Rinsed/Irrigated with Saline Rinsed/Irrigated with Saline Topical Anesthetic Applied: Topical Anesthetic Applied: Topical Anesthetic Applied: Other: lidocaine 4% Other: lidocaine 4% Other: lidocaine 4% Wound Number: 9 N/A N/A Photos: No Photos N/A N/A Wound Location: Right Toe Third N/A N/A Wounding Event: Gradually Appeared N/A N/A Primary Etiology: Diabetic Wound/Ulcer of the N/A N/A Lower Extremity Comorbid History: Anemia, Lymphedema, N/A N/A Congestive Heart Failure, Hypertension, Peripheral Venous Disease, Type II Diabetes, Neuropathy Date Acquired: 12/31/2016 N/A N/A Weeks of Treatment: 6 N/A N/A Wound Status: Open N/A N/A Pending Amputation on No N/A N/A Presentation: Measurements L x W x D 0.6x0.5x0.1 N/A N/A (cm) Area (cm) : 0.236 N/A N/A Volume (cm) : 0.024 N/A N/A % Reduction in Area: 39.90% N/A N/A % Reduction in Volume: 38.50% N/A N/A Chiusano, Antonio E. ([id number]) Classification: Grade 1 N/A N/A Exudate Amount: Large N/A N/A Exudate Type: Serous N/A N/A Exudate Color: amber N/A N/A Foul  Odor After Cleansing: Yes N/A N/A Odor Anticipated Due to No N/A N/A Product Use: Wound Margin: Flat and Intact N/A N/A Granulation Amount: Medium (34-66%) N/A N/A Granulation Quality: Pink N/A N/A Necrotic Amount: Medium (34-66%) N/A N/A Necrotic Tissue: Adherent Slough N/A N/A Exposed Structures: Fascia: No N/A N/A Fat Layer (Subcutaneous Tissue) Exposed: No Tendon: No Muscle: No Joint: No Bone: No Epithelialization: Medium (34-66%) N/A N/A Periwound Skin Texture: Excoriation: No N/A N/A Induration: No Callus: No Crepitus: No Rash: No Scarring: No Periwound Skin Moisture: Maceration: Yes N/A N/A Dry/Scaly: No Periwound Skin Color: Atrophie Blanche: No N/A N/A Cyanosis: No Ecchymosis: No Erythema: No Hemosiderin Staining: No Mottled: No Pallor: No Rubor: No Temperature: No Abnormality N/A N/A Tenderness on Palpation: No N/A N/A Wound Preparation: Ulcer Cleansing: N/A N/A Rinsed/Irrigated with Saline Topical Anesthetic Applied: Other: lidocaine 4% Treatment Notes Electronic Signature(s) Signed: 02/14/2017 10:20:32 AM By: Christin Fudge MD, FACS Entered By: Christin Fudge on 02/14/2017 10:20:32 Hussey, [name] ([id number]) -------------------------------------------------------------------------------- [place] Details Patient Name: Captain, Nester E. Date of Service: 02/14/2017 9:15 AM Medical Record Number: [id number] Patient Account Number: [id number] Date of Birth/Sex: 29-Jul-1950 (66 y.o. Male) Treating RN: Montey Hora Primary Care Lititia Sen: Myrtie Hawk Other Clinician: Referring Joram Venson: Myrtie Hawk Treating Connelly Spruell/Extender: Frann Rider in Treatment: 20 Active Inactive ` Abuse / Safety / Falls / Self Care Management Nursing Diagnoses: Potential for falls Goals: Patient will remain injury free related to falls Date Initiated: 09/21/2016 Target Resolution Date: 11/30/2016 Goal Status:  Active Interventions: Assess fall risk on admission and as needed Notes: ` Nutrition Nursing Diagnoses: Potential for  alteratiion in Nutrition/Potential for imbalanced nutrition Goals: Patient/caregiver agrees to and verbalizes understanding of need to use nutritional supplements and/or vitamins as prescribed Date Initiated: 09/21/2016 Target Resolution Date: 11/30/2016 Goal Status: Active Interventions: Assess patient nutrition upon admission and as needed per policy Notes: ` Orientation to the Wound Care Program Nursing Diagnoses: Knowledge deficit related to the wound healing center program Goals: Patient/caregiver will verbalize understanding of the [place] Date Initiated: 09/21/2016 Target Resolution Date: 11/30/2016 Goal Status: Active Interventions: DARKJamarques, Pinedo ([id number]) Provide education on orientation to the wound center Notes: ` Wound/Skin Impairment Nursing Diagnoses: Knowledge deficit related to smoking impact on wound healing Goals: Ulcer/skin breakdown will have a volume reduction of 30% by week 4 Date Initiated: 09/21/2016 Target Resolution Date: 11/30/2016 Goal Status: Active Ulcer/skin breakdown will have a volume reduction of 50% by week 8 Date Initiated: 09/21/2016 Target Resolution Date: 11/30/2016 Goal Status: Active Ulcer/skin breakdown will have a volume reduction of 80% by week 12 Date Initiated: 09/21/2016 Target Resolution Date: 11/30/2016 Goal Status: Active Ulcer/skin breakdown will heal within 14 weeks Date Initiated: 09/21/2016 Target Resolution Date: 11/30/2016 Goal Status: Active Interventions: Assess patient/caregiver ability to obtain necessary supplies Assess patient/caregiver ability to perform ulcer/skin care regimen upon admission and as needed Assess ulceration(s) every visit Notes: Electronic Signature(s) Signed: 02/14/2017 5:11:26 PM By: Montey Hora Entered By: Montey Hora on 02/14/2017  09:39:42 Pickel, Karen E. ([id number]) -------------------------------------------------------------------------------- Pain Assessment Details Patient Name: Benyo, Tommy E. Date of Service: 02/14/2017 9:15 AM Medical Record Number: [id number] Patient Account Number: [id number] Date of Birth/Sex: Oct 04, 1950 (65 y.o. Male) Treating RN: Montey Hora Primary Care Flavio Lindroth: Myrtie Hawk Other Clinician: Referring Kurstyn Larios: Myrtie Hawk Treating Kanyah Matsushima/Extender: Frann Rider in Treatment: 20 Active Problems Location of Pain Severity and Description of Pain Patient Has Paino No Site Locations Pain Management and Medication Current Pain Management: Electronic Signature(s) Signed: 02/14/2017 5:11:26 PM By: Montey Hora Entered By: Montey Hora on 02/14/2017 95:28:41 Bucholz, [name] ([id number]) -------------------------------------------------------------------------------- Patient/Caregiver Education Details Patient Name: Meader, Gagan E. Date of Service: 02/14/2017 9:15 AM Medical Record Number: [id number] Patient Account Number: [id number] Date of Birth/Gender: 1951/02/28 (66 y.o. Male) Treating RN: Montey Hora Primary Care Physician: Myrtie Hawk Other Clinician: Referring Physician: Myrtie Hawk Treating Physician/Extender: Frann Rider in Treatment: 20 Education Assessment Education Provided To: Patient Education Topics Provided Wound/Skin Impairment: Handouts: Other: wound care as ordered Methods: Demonstration, Explain/Verbal Responses: State content correctly Electronic Signature(s) Signed: 02/14/2017 5:11:26 PM By: Montey Hora Entered By: Montey Hora on 02/14/2017 11:33:45 Mckenzie, [name] ([id number]) -------------------------------------------------------------------------------- Wound Assessment Details Patient Name: Candelaria, Bosten E. Date of Service: 02/14/2017 9:15 AM Medical Record Number:  [id number] Patient Account Number: [id number] Date of Birth/Sex: 10-25-1950 (66 y.o. Male) Treating RN: Montey Hora Primary Care Pearlie Nies: Myrtie Hawk Other Clinician: Referring Tationa Stech: Myrtie Hawk Treating Shamina Etheridge/Extender: Frann Rider in Treatment: 20 Wound Status Wound Number: 1 Primary Diabetic Wound/Ulcer of the Lower Extremity Etiology: Wound Location: Right Toe Great Wound Open Wounding Event: Gradually Appeared Status: Date Acquired: 06/11/2016 Comorbid Anemia, Lymphedema, Congestive Heart Weeks Of Treatment: 20 History: Failure, Hypertension, Peripheral Venous Clustered Wound: No Disease, Type II Diabetes, Neuropathy Pending Amputation On Presentation Photos Photo Uploaded By: Montey Hora on 02/15/2017 09:15:41 Wound Measurements Length: (cm) 1.8 Width: (cm) 1.6 Depth: (cm) 0.1 Area: (cm) 2.262 Volume: (cm) 0.226 % Reduction in Area: 95.2% % Reduction in Volume: 95.2% Epithelialization: Small (1-33%) Tunneling: No Undermining: No Wound Description Classification: Grade 1 Wound Margin: Flat and Intact Exudate Amount: Large  Exudate Type: Serosanguineous Exudate Color: red, brown Foul Odor After Cleansing: Yes Due to Product Use: No Slough/Fibrino Yes Wound Bed Granulation Amount: Medium (34-66%) Exposed Structure Granulation Quality: Pink Fascia Exposed: No Necrotic Amount: Medium (34-66%) Fat Layer (Subcutaneous Tissue) Exposed: Yes Necrotic Quality: Adherent Slough Tendon Exposed: No Muscle Exposed: No Joint Exposed: No Bone Exposed: No Periwound Skin Texture Spruiell, Keelan E. ([id number]) Texture Color No Abnormalities Noted: No No Abnormalities Noted: No Callus: No Atrophie Blanche: No Crepitus: No Cyanosis: No Excoriation: Yes Ecchymosis: No Induration: No Erythema: No Rash: No Hemosiderin Staining: No Scarring: No Mottled: No Pallor: No Moisture Rubor: No No Abnormalities Noted: No Dry / Scaly:  No Temperature / Pain Maceration: Yes Temperature: No Abnormality Wound Preparation Ulcer Cleansing: Rinsed/Irrigated with Saline Topical Anesthetic Applied: Other: lidocaine 4%, Treatment Notes Wound #1 (Right Toe Great) 1. Cleansed with: Clean wound with Normal Saline 4. Dressing Applied: Aquacel Ag 5. Secondary Dressing Applied Dry Gauze Kerlix/Conform Notes Nystatin cream, Sivercell, ABD, conform and stretch net Electronic Signature(s) Signed: 02/14/2017 5:11:26 PM By: Montey Hora Entered By: Montey Hora on 02/14/2017 09:35:22 Crossan, [name] ([id number]) -------------------------------------------------------------------------------- Wound Assessment Details Patient Name: Nierenberg, Phong E. Date of Service: 02/14/2017 9:15 AM Medical Record Number: [id number] Patient Account Number: [id number] Date of Birth/Sex: 1950/09/10 (66 y.o. Male) Treating RN: Montey Hora Primary Care Sherrod Toothman: Myrtie Hawk Other Clinician: Referring Kawika Bischoff: Myrtie Hawk Treating Emmalin Jaquess/Extender: Frann Rider in Treatment: 20 Wound Status Wound Number: 10 Primary Diabetic Wound/Ulcer of the Lower Extremity Etiology: Wound Location: Right Toe Fourth Wound Open Wounding Event: Not Known Status: Date Acquired: 01/28/2017 Comorbid Anemia, Lymphedema, Congestive Heart Weeks Of Treatment: 2 History: Failure, Hypertension, Peripheral Venous Clustered Wound: No Disease, Type II Diabetes, Neuropathy Photos Photo Uploaded By: Montey Hora on 02/15/2017 09:15:43 Wound Measurements Length: (cm) 0.2 Width: (cm) 1.3 Depth: (cm) 0.7 Area: (cm) 0.204 Volume: (cm) 0.143 % Reduction in Area: 81.5% % Reduction in Volume: 85.6% Epithelialization: None Tunneling: No Undermining: No Wound Description Classification: Grade 2 Wound Margin: Flat and Intact Exudate Amount: Large Exudate Type: Serous Exudate Color: amber Foul Odor After Cleansing: Yes Due to  Product Use: No Slough/Fibrino Yes Wound Bed Granulation Amount: Small (1-33%) Exposed Structure Granulation Quality: Red Fascia Exposed: No Necrotic Amount: Large (67-100%) Fat Layer (Subcutaneous Tissue) Exposed: Yes Necrotic Quality: Eschar, Adherent Slough Tendon Exposed: No Muscle Exposed: No Joint Exposed: No Bone Exposed: No Periwound Skin Texture Longanecker, Haziel E. ([id number]) Texture Color No Abnormalities Noted: No No Abnormalities Noted: No Callus: No Atrophie Blanche: No Crepitus: No Cyanosis: No Excoriation: No Ecchymosis: No Induration: No Erythema: No Rash: No Hemosiderin Staining: No Scarring: No Mottled: No Pallor: No Moisture Rubor: No No Abnormalities Noted: No Dry / Scaly: No Temperature / Pain Maceration: Yes Temperature: No Abnormality Wound Preparation Ulcer Cleansing: Rinsed/Irrigated with Saline Topical Anesthetic Applied: Other: lidocaine 4%, Treatment Notes Wound #10 (Right Toe Fourth) 1. Cleansed with: Clean wound with Normal Saline 4. Dressing Applied: Aquacel Ag 5. Secondary Dressing Applied Dry Gauze Kerlix/Conform Notes Nystatin cream, Sivercell, ABD, conform and stretch net Electronic Signature(s) Signed: 02/14/2017 5:11:26 PM By: Montey Hora Entered By: Montey Hora on 02/14/2017 09:36:52 Auten, [name] ([id number]) -------------------------------------------------------------------------------- Wound Assessment Details Patient Name: Furnish, Blandon E. Date of Service: 02/14/2017 9:15 AM Medical Record Number: [id number] Patient Account Number: [id number] Date of Birth/Sex: [birth year] (66 y.o. Male) Treating RN: Montey Hora Primary Care Machel Violante: Myrtie Hawk Other Clinician: Referring Jakayden Cancio: Myrtie Hawk Treating Tyreik Delahoussaye/Extender: Frann Rider in Treatment: 20 Wound Status Wound  Number: 2 Primary Diabetic Wound/Ulcer of the Lower Extremity Etiology: Wound Location: Right Toe  Second Wound Open Wounding Event: Gradually Appeared Status: Date Acquired: 06/11/2016 Comorbid Anemia, Lymphedema, Congestive Heart Weeks Of Treatment: 20 History: Failure, Hypertension, Peripheral Venous Clustered Wound: No Disease, Type II Diabetes, Neuropathy Pending Amputation On Presentation Photos Photo Uploaded By: Montey Hora on 02/15/2017 09:17:06 Wound Measurements Length: (cm) 0.2 Width: (cm) 0.2 Depth: (cm) 0.1 Area: (cm) 0.031 Volume: (cm) 0.003 % Reduction in Area: 99% % Reduction in Volume: 99% Epithelialization: Small (1-33%) Tunneling: No Undermining: No Wound Description Classification: Grade 1 Wound Margin: Flat and Intact Exudate Amount: Large Exudate Type: Serosanguineous Exudate Color: red, brown Foul Odor After Cleansing: Yes Due to Product Use: No Slough/Fibrino Yes Wound Bed Granulation Amount: Medium (34-66%) Exposed Structure Granulation Quality: Pink Fascia Exposed: No Necrotic Amount: Medium (34-66%) Fat Layer (Subcutaneous Tissue) Exposed: Yes Necrotic Quality: Adherent Slough Tendon Exposed: No Muscle Exposed: No Joint Exposed: No Bone Exposed: No Periwound Skin Texture Brunke, Jyquan E. ([id number]) Texture Color No Abnormalities Noted: No No Abnormalities Noted: No Callus: No Atrophie Blanche: No Crepitus: No Cyanosis: No Excoriation: Yes Ecchymosis: No Induration: No Erythema: No Rash: No Hemosiderin Staining: No Scarring: No Mottled: No Pallor: No Moisture Rubor: No No Abnormalities Noted: No Dry / Scaly: No Temperature / Pain Maceration: Yes Temperature: No Abnormality Wound Preparation Ulcer Cleansing: Rinsed/Irrigated with Saline Topical Anesthetic Applied: Other: lidocaine 4%, Treatment Notes Wound #2 (Right Toe Second) 1. Cleansed with: Clean wound with Normal Saline 4. Dressing Applied: Aquacel Ag 5. Secondary Dressing Applied Dry Gauze Kerlix/Conform Notes Nystatin cream, Sivercell, ABD,  conform and stretch net Electronic Signature(s) Signed: 02/14/2017 5:11:26 PM By: Montey Hora Entered By: Montey Hora on 02/14/2017 09:37:17 Coard, [name] ([id number]) -------------------------------------------------------------------------------- Wound Assessment Details Patient Name: Arnett, Sabastion E. Date of Service: 02/14/2017 9:15 AM Medical Record Number: [id number] Patient Account Number: [id number] Date of Birth/Sex: 1950/10/08 (66 y.o. Male) Treating RN: Montey Hora Primary Care [name] Struss: Myrtie Hawk Other Clinician: Referring Zamariya Neal: Myrtie Hawk Treating Akima Slaugh/Extender: Frann Rider in Treatment: 20 Wound Status Wound Number: 5 Primary Diabetic Wound/Ulcer of the Lower Extremity Etiology: Wound Location: Left Toe Great Wound Open Wounding Event: Gradually Appeared Status: Date Acquired: 06/11/2016 Comorbid Anemia, Lymphedema, Congestive Heart Weeks Of Treatment: 20 History: Failure, Hypertension, Peripheral Venous Clustered Wound: No Disease, Type II Diabetes, Neuropathy Pending Amputation On Presentation Photos Photo Uploaded By: Montey Hora on 02/15/2017 09:18:21 Wound Measurements Length: (cm) 1.5 Width: (cm) 2 Depth: (cm) 0.1 Area: (cm) 2.356 Volume: (cm) 0.236 % Reduction in Area: 83.5% % Reduction in Volume: 83.5% Epithelialization: Small (1-33%) Tunneling: No Undermining: No Wound Description Classification: Grade 1 Wound Margin: Flat and Intact Exudate Amount: Large Exudate Type: Serous Exudate Color: amber Foul Odor After Cleansing: Yes Due to Product Use: No Slough/Fibrino Yes Wound Bed Granulation Amount: Medium (34-66%) Exposed Structure Granulation Quality: Pink Fascia Exposed: No Necrotic Amount: Medium (34-66%) Fat Layer (Subcutaneous Tissue) Exposed: Yes Necrotic Quality: Adherent Slough Tendon Exposed: No Muscle Exposed: No Joint Exposed: No Bone Exposed: No Periwound Skin  Texture Daniely, Mieczyslaw E. ([id number]) Texture Color No Abnormalities Noted: No No Abnormalities Noted: No Callus: No Atrophie Blanche: No Crepitus: No Cyanosis: No Excoriation: Yes Ecchymosis: No Induration: No Erythema: No Rash: No Hemosiderin Staining: No Scarring: No Mottled: No Pallor: No Moisture Rubor: No No Abnormalities Noted: No Dry / Scaly: No Temperature / Pain Maceration: Yes Temperature: No Abnormality Wound Preparation Ulcer Cleansing: Rinsed/Irrigated with Saline Topical Anesthetic Applied: Other: lidocaine 4%,  Treatment Notes Wound #5 (Left Toe Great) 1. Cleansed with: Clean wound with Normal Saline 4. Dressing Applied: Aquacel Ag 5. Secondary Dressing Applied Dry Gauze Kerlix/Conform Notes Nystatin cream, Sivercell, ABD, conform and stretch net Electronic Signature(s) Signed: 02/14/2017 5:11:26 PM By: Montey Hora Entered By: Montey Hora on 02/14/2017 09:37:41 Ciriello, [name] ([id number]) -------------------------------------------------------------------------------- Wound Assessment Details Patient Name: Zheng, Trase E. Date of Service: 02/14/2017 9:15 AM Medical Record Number: [id number] Patient Account Number: [id number] Date of Birth/Sex: 12/11/1950 (66 y.o. Male) Treating RN: Montey Hora Primary Care [employer] Null: Myrtie Hawk Other Clinician: Referring Samarrah Tranchina: Myrtie Hawk Treating Dallana Mavity/Extender: Frann Rider in Treatment: 20 Wound Status Wound Number: 6 Primary Diabetic Wound/Ulcer of the Lower Extremity Etiology: Wound Location: Left Toe Second Wound Open Wounding Event: Gradually Appeared Status: Date Acquired: 06/11/2016 Comorbid Anemia, Lymphedema, Congestive Heart Weeks Of Treatment: 20 History: Failure, Hypertension, Peripheral Venous Clustered Wound: No Disease, Type II Diabetes, Neuropathy Pending Amputation On Presentation Photos Photo Uploaded By: Montey Hora on 02/15/2017  09:17:07 Wound Measurements Length: (cm) 1.6 Width: (cm) 0.6 Depth: (cm) 0.1 Area: (cm) 0.754 Volume: (cm) 0.075 % Reduction in Area: 64.7% % Reduction in Volume: 65% Epithelialization: Small (1-33%) Tunneling: No Undermining: No Wound Description Classification: Grade 1 Wound Margin: Flat and Intact Exudate Amount: Large Exudate Type: Serous Exudate Color: amber Foul Odor After Cleansing: Yes Due to Product Use: No Slough/Fibrino Yes Wound Bed Granulation Amount: Medium (34-66%) Exposed Structure Granulation Quality: Pink Fascia Exposed: No Necrotic Amount: Medium (34-66%) Fat Layer (Subcutaneous Tissue) Exposed: Yes Necrotic Quality: Adherent Slough Tendon Exposed: No Muscle Exposed: No Joint Exposed: No Bone Exposed: No Periwound Skin Texture Routh, Levis E. ([id number]) Texture Color No Abnormalities Noted: No No Abnormalities Noted: No Callus: No Atrophie Blanche: No Crepitus: No Cyanosis: No Excoriation: Yes Ecchymosis: No Induration: No Erythema: No Rash: No Hemosiderin Staining: No Scarring: No Mottled: No Pallor: No Moisture Rubor: No No Abnormalities Noted: No Dry / Scaly: No Temperature / Pain Maceration: Yes Temperature: No Abnormality Wound Preparation Ulcer Cleansing: Rinsed/Irrigated with Saline Topical Anesthetic Applied: Other: lidocaine 4%, Treatment Notes Wound #6 (Left Toe Second) 1. Cleansed with: Clean wound with Normal Saline 4. Dressing Applied: Aquacel Ag 5. Secondary Dressing Applied Dry Gauze Kerlix/Conform Notes Nystatin cream, Sivercell, ABD, conform and stretch net Electronic Signature(s) Signed: 02/14/2017 5:11:26 PM By: Montey Hora Entered By: Montey Hora on 02/14/2017 09:38:15 Pacitti, [name] ([id number]) -------------------------------------------------------------------------------- Wound Assessment Details Patient Name: Melin, Joren E. Date of Service: 02/14/2017 9:15 AM Medical Record Number:  [id number] Patient Account Number: [id number] Date of Birth/Sex: 04/12/1950 (66 y.o. Male) Treating RN: Montey Hora Primary Care Marvette Schamp: Myrtie Hawk Other Clinician: Referring Neythan Kozlov: Myrtie Hawk Treating Verma Grothaus/Extender: Frann Rider in Treatment: 20 Wound Status Wound Number: 8 Primary Diabetic Wound/Ulcer of the Lower Extremity Etiology: Wound Location: Left Toe Third Wound Open Wounding Event: Gradually Appeared Status: Date Acquired: 12/30/2016 Comorbid Anemia, Lymphedema, Congestive Heart Weeks Of Treatment: 6 History: Failure, Hypertension, Peripheral Venous Clustered Wound: No Disease, Type II Diabetes, Neuropathy Photos Photo Uploaded By: Montey Hora on 02/15/2017 09:18:56 Wound Measurements Length: (cm) 0.5 Width: (cm) 0.4 Depth: (cm) 0.1 Area: (cm) 0.157 Volume: (cm) 0.016 % Reduction in Area: 80.4% % Reduction in Volume: 80% Epithelialization: None Tunneling: No Undermining: No Wound Description Classification: Grade 1 Wound Margin: Distinct, outline attached Exudate Amount: Large Exudate Type: Serosanguineous Exudate Color: red, brown Foul Odor After Cleansing: No Slough/Fibrino Yes Wound Bed Granulation Amount: Medium (34-66%) Granulation Quality: Red Necrotic Amount: Medium (34-66%) Necrotic Quality: Adherent  Slough Periwound Skin Texture Texture Color No Abnormalities Noted: No No Abnormalities Noted: No Moisture Temperature / Pain Hogue, Tyshun E. ([id number]) No Abnormalities Noted: No Temperature: No Abnormality Maceration: Yes Tenderness on Palpation: Yes Wound Preparation Ulcer Cleansing: Rinsed/Irrigated with Saline Topical Anesthetic Applied: Other: lidocaine 4%, Treatment Notes Wound #8 (Left Toe Third) 1. Cleansed with: Clean wound with Normal Saline 4. Dressing Applied: Aquacel Ag 5. Secondary Dressing Applied Dry Gauze Kerlix/Conform Notes Nystatin cream, Sivercell, ABD, conform  and stretch net Electronic Signature(s) Signed: 02/14/2017 5:11:26 PM By: Montey Hora Entered By: Montey Hora on 02/14/2017 09:38:40 Wessinger, [name] ([id number]) -------------------------------------------------------------------------------- Wound Assessment Details Patient Name: Stiner, Orpheus E. Date of Service: 02/14/2017 9:15 AM Medical Record Number: [id number] Patient Account Number: [id number] Date of Birth/Sex: [birth year] (66 y.o. Male) Treating RN: Montey Hora Primary Care Icesis Renn: Myrtie Hawk Other Clinician: Referring Burt Piatek: Myrtie Hawk Treating Arriel Victor/Extender: Frann Rider in Treatment: 20 Wound Status Wound Number: 9 Primary Diabetic Wound/Ulcer of the Lower Extremity Etiology: Wound Location: Right Toe Third Wound Open Wounding Event: Gradually Appeared Status: Date Acquired: 12/31/2016 Comorbid Anemia, Lymphedema, Congestive Heart Weeks Of Treatment: 6 History: Failure, Hypertension, Peripheral Venous Clustered Wound: No Disease, Type II Diabetes, Neuropathy Photos Photo Uploaded By: Montey Hora on 02/15/2017 09:18:57 Wound Measurements Length: (cm) 0.6 Width: (cm) 0.5 Depth: (cm) 0.1 Area: (cm) 0.236 Volume: (cm) 0.024 % Reduction in Area: 39.9% % Reduction in Volume: 38.5% Epithelialization: Medium (34-66%) Tunneling: No Undermining: No Wound Description Classification: Grade 1 Wound Margin: Flat and Intact Exudate Amount: Large Exudate Type: Serous Exudate Color: amber Foul Odor After Cleansing: Yes Due to Product Use: No Slough/Fibrino Yes Wound Bed Granulation Amount: Medium (34-66%) Exposed Structure Granulation Quality: Pink Fascia Exposed: No Necrotic Amount: Medium (34-66%) Fat Layer (Subcutaneous Tissue) Exposed: No Necrotic Quality: Adherent Slough Tendon Exposed: No Muscle Exposed: No Joint Exposed: No Bone Exposed: No Periwound Skin Texture Allis, Deegan E. ([id number]) Texture  Color No Abnormalities Noted: No No Abnormalities Noted: No Callus: No Atrophie Blanche: No Crepitus: No Cyanosis: No Excoriation: No Ecchymosis: No Induration: No Erythema: No Rash: No Hemosiderin Staining: No Scarring: No Mottled: No Pallor: No Moisture Rubor: No No Abnormalities Noted: No Dry / Scaly: No Temperature / Pain Maceration: Yes Temperature: No Abnormality Wound Preparation Ulcer Cleansing: Rinsed/Irrigated with Saline Topical Anesthetic Applied: Other: lidocaine 4%, Treatment Notes Wound #9 (Right Toe Third) 1. Cleansed with: Clean wound with Normal Saline 4. Dressing Applied: Aquacel Ag 5. Secondary Dressing Applied Dry Gauze Kerlix/Conform Notes Nystatin cream, Sivercell, ABD, conform and stretch net Electronic Signature(s) Signed: 02/14/2017 5:11:26 PM By: Montey Hora Entered By: Montey Hora on 02/14/2017 09:39:10 Levels, [name] ([id number]) -------------------------------------------------------------------------------- [place] Details Patient Name: Dement, Burnham E. Date of Service: 02/14/2017 9:15 AM Medical Record Number: [id number] Patient Account Number: [id number] Date of Birth/Sex: Jul 18, 1950 (66 y.o. Male) Treating RN: Montey Hora Primary Care Matsue Strom: Myrtie Hawk Other Clinician: Referring Paytan Recine: Myrtie Hawk Treating Gabrielle Wakeland/Extender: Frann Rider in Treatment: 20 Vital Signs Time Taken: 09:28 Temperature (F): 98.1 Height (in): 69 Pulse (bpm): 67 Weight (lbs): 168 Respiratory Rate (breaths/min): 18 Body Mass Index (BMI): 24.8 Blood Pressure (mmHg): 194/82 Reference Range: 80 - 120 mg / dl Notes Retook BP manually 178/82. Made Dr. Con Memos aware. Electronic Signature(s) Signed: 02/14/2017 5:11:26 PM By: Montey Hora Entered By: Montey Hora on 02/14/2017 09:34:42

## 2017-02-18 ENCOUNTER — Ambulatory Visit
Admission: RE | Admit: 2017-02-18 | Discharge: 2017-02-18 | Disposition: A | Discharge status: Home / Self Care | Payer: Medicare HMO | Source: Ambulatory Visit | Attending: Surgery | Admitting: Surgery

## 2017-02-18 ENCOUNTER — Other Ambulatory Visit: Payer: Self-pay | Admitting: Oncology

## 2017-02-18 DIAGNOSIS — L97929 Non-pressure chronic ulcer of unspecified part of left lower leg with unspecified severity: Secondary | ICD-10-CM | POA: Insufficient documentation

## 2017-02-18 DIAGNOSIS — M7989 Other specified soft tissue disorders: Secondary | ICD-10-CM | POA: Insufficient documentation

## 2017-02-18 DIAGNOSIS — L97919 Non-pressure chronic ulcer of unspecified part of right lower leg with unspecified severity: Secondary | ICD-10-CM | POA: Diagnosis not present

## 2017-02-18 DIAGNOSIS — IMO0001 Reserved for inherently not codable concepts without codable children: Secondary | ICD-10-CM

## 2017-02-18 DIAGNOSIS — I87313 Chronic venous hypertension (idiopathic) with ulcer of bilateral lower extremity: Secondary | ICD-10-CM | POA: Diagnosis not present

## 2017-02-18 DIAGNOSIS — L97512 Non-pressure chronic ulcer of other part of right foot with fat layer exposed: Secondary | ICD-10-CM | POA: Insufficient documentation

## 2017-02-18 NOTE — Progress Notes (Signed)
Hematology/Oncology Consult note [facility]  [phone] [phone]  Patient Care Team: McLean-Scocuzza, Nino Glow, MD as PCP - General (Internal Medicine)   Name of the patient: [name]  [id number]  1951/01/22   Date of visit: 02/18/17   Diagnosis- 1. Intraabdominal adenopathy 2. 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. 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 Baruch Gouty. Patient had CT abdomen in July 2016 which showed retroperitoneal, celiac and gastric adenopathy. MR pelvis in Oct 2017 showed b/l inguinal and external iliac adenopathy as well. Inguinal LN biopsy was negative for malignancy.   2. Biopsy of the skin lesion showed: DIAGNOSIS:  A. PERIANAL POLYP, LEFT; EXCISION:  - SKIN WITH POLYPOID VASCULAR PROLIFERATION WITH SUPERFICIAL NECROSIS,  SEE COMMENT.   B. PERIANAL MASS, RIGHT; INCISIONAL BIOPSY:  - INVASIVE CARCINOMA WITH BASALOID FEATURES.  - SEE COMMENT.   Comment #1:  The differential diagnosis for the perianal polyp includes pyogenic  granuloma and ulcerated hemangioma.   Comment #2:  Immunohistochemical stains were performed. The carcinoma is positive for  p16, BerEp4, and BCL-2. Stain controls worked appropriately.  Unfortunately the pattern of staining does not assist with further  classification of the carcinoma. Clinical correlation is required to  determine if the lesion involves perianal skin primarily (BCC) or is  arising in the anal canal (squamous cell carcinoma with basaloid  features).   This was followed by RT to the lesion.  3. He had repeat CT abdomen in feb 2018 which showed: IMPRESSION: Progressive hepatic cirrhosis since prior study. No evidence of hepatic  neoplasm.  New moderate ascites, diffuse mesenteric and body wall edema, and small bilateral pleural effusions. Mild diffuse small bowel wall thickening, consistent with hypoalbuminemia.  Findings consistent with portal venous hypertension, including mild splenomegaly and upper abdominal venous collaterals.  Stable mild abdominal lymphadenopathy. Mild increase in bilateral iliac and inguinal lymphadenopathy in the pelvis, which may be reactive in etiology, with metastatic disease considered less Likely.  4. Ascites recently drained. He is get egd and colonoscopy for his microcytic anemia  5. CBC on 09/18/2016 showed H&H of 6.5/19.8 and MCV of 86.7. Labs suggestive of iron deficiency anemia. B12 and folate was within normal limits. Multiple myeloma panel showed IGG monoclonal protein of 0.9 gm. Polyclonal gammopathyHaptoglobin and no copper was normal. Reticulocyte count was mildly elevated at 7.9. Patient received 2 doses of ferriheme on 620 and 10/03/2016. He also received 1 unit of blood transfusion  6. EGD showed a normal duodenum and normal esophagus. Found to have gastritis which was biopsied. Also found to have a large polypoid mass in the left piriform sinus and nonobstructing the airway. This was subsequently evaluated by ENT which did not find any evidence of mass. The colonoscopy showed appendixthat was appearing protruding and ordered this blood with stool. No mucus seen in the vicinity. He was evaluated by a [facility] and has been considered for a repeat colonoscopy versus endoscopy mucosal resection of the mucocele  7.  Bone biopsy showed  hypercellular marrow for age.  9% plasma cells.  Clonality of cells could not be evaluated because of lack of clot sections available for staining.  Cytogenetics were normal.  FISH studies were negative for any  chromosomal abnormalities as well.   Interval history- feels well. He has not required ascites tap recently. Leg swelling has  improved  ECOG PS- 1 Pain scale- 0   Review of systems- Review of Systems  Constitutional: Positive for malaise/fatigue. Negative for chills, fever and weight loss.  HENT: Negative for congestion, ear discharge and nosebleeds.   Eyes: Negative for blurred vision.  Respiratory: Negative for cough, hemoptysis, sputum production, shortness of breath and wheezing.   Cardiovascular: Positive for leg swelling. Negative for chest pain, palpitations, orthopnea and claudication.  Gastrointestinal: Negative for abdominal pain, blood in stool, constipation, diarrhea, heartburn, melena, nausea and vomiting.  Genitourinary: Negative for dysuria, flank pain, frequency, hematuria and urgency.  Musculoskeletal: Negative for back pain, joint pain and myalgias.  Skin: Negative for rash.  Neurological: Negative for dizziness, tingling, focal weakness, seizures, weakness and headaches.  Endo/Heme/Allergies: Does not bruise/bleed easily.  Psychiatric/Behavioral: Negative for depression and suicidal ideas. The patient does not have insomnia.       No Known Allergies   Past Medical History:  Diagnosis Date  . Anemia   . CHF (congestive heart failure) ([place])   . Diabetes mellitus    Patient take Metformin.  Marland Kitchen GERD (gastroesophageal reflux disease)   . Hypertension   . Leg swelling   . Loose, teeth    PATIENT STATES "HAS 6 TEETH, SOME ARE LOOSE"  . Neuromuscular disorder ([place])    RIGHT HAND AND FINGERS NUMB  . Shortness of breath dyspnea   . 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 02/24/2016     Past Surgical History:  Procedure Laterality Date  . APPENDECTOMY    . TONSILLECTOMY      Social History   Socioeconomic History  . Marital status: Divorced    Spouse name: Not on file  . Number of children: Not on file  . Years of education: Not on file  . Highest education level: Not on file  Social Needs  . Financial resource strain: Not on file  . Food insecurity - worry: Not on file  .  Food insecurity - inability: Not on file  . Transportation needs - medical: Not on file  . Transportation needs - non-medical: Not on file  Occupational History  . Not on file  Tobacco Use  . Smoking status: Current Every Day Smoker    Packs/day: 0.50    Years: 20.00    Pack years: 10.00    Types: Cigarettes  . Smokeless tobacco: Never Used  . Tobacco comment: pt claims no smoking x 3 d  Substance and Sexual Activity  . Alcohol use: No    Comment: per pt used to drink beer daily- quit may/2018  . Drug use: Yes    Types: Marijuana    Comment: Last Use 2016 per patient  . Sexual activity: No  Other Topics Concern  . Not on file  Social History Narrative  . Not on file    Family History  Problem Relation Age of Onset  . Asthma Mother      Current Outpatient Medications:  .  amLODipine (NORVASC) 10 MG tablet, Take by mouth., Disp: , Rfl:  .  aspirin EC 81 MG tablet, Take 81 mg by mouth daily., Disp: , Rfl:  .  atorvastatin (LIPITOR) 40 MG tablet, Take 40 mg by mouth at bedtime., Disp: , Rfl:  .  chlorthalidone (HYGROTON) 25 MG tablet, Take by mouth., Disp: , Rfl:  .  doxycycline (VIBRA-TABS) 100 MG tablet, Take by  mouth., Disp: , Rfl:  .  ferrous sulfate 325 (65 FE) MG tablet, Take by mouth., Disp: , Rfl:  .  HYDROcodone-acetaminophen (NORCO) 5-325 MG tablet, Take 1 tablet by mouth every 6 (six) hours as needed for moderate pain. (Patient not taking: Reported on 12/06/2016), Disp: 30 tablet, Rfl: 0 .  hydrOXYzine (ATARAX/VISTARIL) 25 MG tablet, Take 25 mg by mouth 3 times daily as needed., Disp: , Rfl:  .  isosorbide mononitrate (IMDUR) 30 MG 24 hr tablet, Take 30 mg by mouth daily. , Disp: , Rfl:  .  lisinopril (PRINIVIL,ZESTRIL) 40 MG tablet, Take 40 mg by mouth once daily., Disp: , Rfl:  .  omeprazole (PRILOSEC) 40 MG capsule, Take by mouth., Disp: , Rfl:  .  polyethylene glycol (MIRALAX / GLYCOLAX) packet, Take by mouth., Disp: , Rfl:  .  silver sulfADIAZINE (SILVADENE) 1  % cream, Apply 1 application topically 2 (two) times daily., Disp: 50 g, Rfl: 2 .  spironolactone (ALDACTONE) 25 MG tablet, Take 25 mg by mouth daily. , Disp: , Rfl:  .  torsemide (DEMADEX) 20 MG tablet, Take 20 mg by mouth daily. , Disp: , Rfl:   Current Facility-Administered Medications:  .  albumin human 25 % solution 50 g, 50 g, Intravenous, Once, Jonathon Bellows, MD .  albumin human 25 % solution 50 g, 50 g, Intravenous, Once, Jonathon Bellows, MD  Physical exam: There were no vitals filed for this visit. Physical Exam  Constitutional: He is oriented to person, place, and time and well-developed, well-nourished, and in no distress.  HENT:  Head: Normocephalic and atraumatic.  Eyes: EOM are normal. Pupils are equal, round, and reactive to light.  Neck: Normal range of motion.  Cardiovascular: Normal rate and regular rhythm.  Systolic murmur +  Pulmonary/Chest: Effort normal and breath sounds normal.  Abdominal: Soft. Bowel sounds are normal.  Musculoskeletal: He exhibits edema.  Neurological: He is alert and oriented to person, place, and time.  Skin: Skin is warm and dry.     CMP Latest Ref Rng & Units 02/04/2017  Glucose 65 - 99 mg/dL 126(H)  BUN 6 - 20 mg/dL 40(H)  Creatinine 0.61 - 1.24 mg/dL 2.43(H)  Sodium 135 - 145 mmol/L 130(L)  Potassium 3.5 - 5.1 mmol/L 5.0  Chloride 101 - 111 mmol/L 103  CO2 22 - 32 mmol/L 20(L)  Calcium 8.9 - 10.3 mg/dL 8.4(L)  Total Protein 6.5 - 8.1 g/dL 7.9  Total Bilirubin 0.3 - 1.2 mg/dL 0.6  Alkaline Phos 38 - 126 U/L 102  AST 15 - 41 U/L 34  ALT 17 - 63 U/L 23   CBC Latest Ref Rng & Units 02/04/2017  WBC 3.8 - 10.6 K/uL 7.1  Hemoglobin 13.0 - 18.0 g/dL 8.0(L)  Hematocrit 40.0 - 52.0 % 23.8(L)  Platelets 150 - 440 K/uL 233    No images are attached to the encounter.  Ct Abdomen Pelvis Wo Contrast  Result Date: 02/04/2017 CLINICAL DATA:  Intra-abdominal adenopathy. Hepatic cirrhosis. Invasive carcinoma upon biopsy of a perianal mass.  EXAM: CT ABDOMEN AND PELVIS WITHOUT CONTRAST TECHNIQUE: Multidetector CT imaging of the abdomen and pelvis was performed following the standard protocol without IV contrast. COMPARISON:  05/30/2016 CT FINDINGS: Lower chest: Ground-glass density 0.7 by 0.6 cm nodule in the right lower lobe on image 5/3 comment newly apparent compared to the prior exam. Right lateral pericardial lymph node 1.3 cm in short axis on image 9/2, previously 1.5 cm. Prior pleural effusions have resolved. contrast medium in  the esophagus suggesting dysmotility or reflux. Calcifications of the mitral and aortic valve. Low-density blood pool suggests anemia. Hepatobiliary: Hepatic contour favoring cirrhosis. Contracted gallbladder. Pancreas: Unremarkable Spleen: Upper normal splenic size. Adrenals/Urinary Tract: Unremarkable Stomach/Bowel: Unremarkable Vascular/Lymphatic: Aortoiliac atherosclerotic vascular disease. Scattered adenopathy in the abdomen and pelvis including a 1.2 cm right gastric node on image 21/2 (formerly 1.4 cm) ; a 1.6 cm portacaval node on image 27/2 (formerly 1.8 cm) ; a 1.5 cm aortocaval node on image 34/2 (Formerly 1.8 cm) ; a 1.3 cm right external iliac node on image 74/2 (formerly 1.7 cm and a 1.1 cm left external iliac lymph node on image 72/2 (formerly 1.3 cm). Other enlarged lymph nodes are present including inguinal lymph nodes bilaterally, and follow up a similar pattern of mild reduction in size compared to the prior exam. No definite worsening adenopathy is identified. Reproductive: Unremarkable Other: No supplemental non-categorized findings. Musculoskeletal: Mild erosions along both sacroiliac joints. Central disc protrusion at L2-3 shown on image 60/6. Degenerative disc disease and spondylosis at L4-5 and especially L5-S1 potentially causing foraminal impingement. Mild chondrocalcinosis is observed in the left acetabular labrum. IMPRESSION: 1. The adenopathy in the abdomen and pelvis is improved but not  resolved. The patient does have signs of cirrhosis and there can be significant reactive adenopathy in the setting of cirrhosis that is not malignant. It does appear that the patient has had significant chronic adenopathy, for example since 2016 and the even back as far as 2008 ; low-grade lymphoproliferative disorder is not entirely excluded. 2. New ground-glass density in the right lower lobe measuring 7 mm in long axis, probably local alveolitis but low-grade neoplastic process cannot be excluded. Initial follow-up with CT at 6-12 months is recommended to confirm persistence. If persistent, repeat CT is recommended every 2 years until 5 years of stability has been established. This recommendation follows the consensus statement: Guidelines for Management of Incidental Pulmonary Nodules Detected on CT Images: From the Fleischner Society 2017; Radiology 2017; 284:228-243. 3. Other imaging findings of potential clinical significance: Contrast medium in the esophagus suggesting dysmotility or reflux. Mild calcifications of the mitral and aortic valves. Low-density blood pool suggests anemia. Mild erosions along both sacroiliac joints compatible with mild bilateral sacroiliitis. Lumbar spondylosis and degenerative disc disease causing impingement at L4-5 and L5-S1. Electronically Signed   By: Van Clines M.D.   On: 02/04/2017 10:48   Dg Foot Complete Right  Result Date: 01/28/2017 CLINICAL DATA:  Right foot infection in a diabetic patient for over 1 week. EXAM: RIGHT FOOT COMPLETE - 3+ VIEW COMPARISON:  Plain films the right foot 09/21/2016. FINDINGS: Soft tissues of the first through fourth toes appear markedly swollen with skin wounds present. No soft tissue gas collection or radiopaque foreign body is identified. On the lateral view, new bony destructive change is seen in the tuft of the distal phalanx of the great toe. No other focal bony abnormality is identified. IMPRESSION: Soft tissue swelling  about the first through fourth toes consistent with cellulitis. New destructive change in the tuft of the distal phalanx of the great toe is consistent with osteomyelitis. Electronically Signed   By: Inge Rise M.D.   On: 01/28/2017 11:25     Assessment and plan- Patient is a 66 y.o. male who sees me for following issues  1. Anemia- likely due to iron deficiency as well as anemia of chronic disease and anemia of chronic kidney disease. His hb has been between 8-9 for last 2 years and  has remained around that range. No significant improvement despite getting IV iron in June 2018. He does have chronic kidney disease since may 2018 and he sees nephrology for the same which may be contributing to his anemia as well. Bone marrow biopsy showed 9% plasma cells (although clonality could not be determined). FISH and cytogenetics were normal. 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 Serum free light chains showed both kappa and lambda light chain elevation with preserved ratio. spep showed 1gm monoclonal IgG lambda protein  I will repeat cbc with diff, CMP, free light chain, myeloma panel and 24 hour urine UPEP and IFE in 3 months and see him back in 3 months (labs done a week prior)  I will proceed with weekly procrit to see if it helps his anemia. Discussed risks and benefits of procrit including all but not limited to increased risk of thrombosis if hb increased to >11. We will plan to keep a goal hb of 10-11 with procrit shots which he will continue weekly. I will plan to stop it if no improvement after 2 months  2. Intra abdominal adenopathy- chronic since 2008. Improved as compared to feb 2018- chronic reactive secondary to cirrhosis versus low grade lymphoproliferative disorder. Given the chronicity of these findings, I will hold off on further CT abdomen  3. RLL lung nodule- repeat ct thorax without contrast in 6 months   Visit Diagnosis 1.  Normocytic anemia   2. History of anemia due to chronic kidney disease   3. Anemia of chronic disease   4. Adenopathy   5. Lung nodule      Dr. Randa Evens, MD, MPH [facility] Pager- [phone] 02/18/2017 12:13 PM

## 2017-02-19 ENCOUNTER — Inpatient Hospital Stay: Payer: Medicare HMO

## 2017-02-19 ENCOUNTER — Inpatient Hospital Stay: Payer: Medicare HMO | Attending: Oncology | Admitting: Oncology

## 2017-02-19 ENCOUNTER — Encounter: Payer: Self-pay | Admitting: Oncology

## 2017-02-19 VITALS — BP 175/89 | HR 63 | Temp 96.0°F | Wt 171.0 lb

## 2017-02-19 DIAGNOSIS — R188 Other ascites: Secondary | ICD-10-CM | POA: Insufficient documentation

## 2017-02-19 DIAGNOSIS — D631 Anemia in chronic kidney disease: Secondary | ICD-10-CM | POA: Diagnosis not present

## 2017-02-19 DIAGNOSIS — Z79899 Other long term (current) drug therapy: Secondary | ICD-10-CM | POA: Diagnosis not present

## 2017-02-19 DIAGNOSIS — N189 Chronic kidney disease, unspecified: Secondary | ICD-10-CM | POA: Diagnosis not present

## 2017-02-19 DIAGNOSIS — M5126 Other intervertebral disc displacement, lumbar region: Secondary | ICD-10-CM | POA: Diagnosis not present

## 2017-02-19 DIAGNOSIS — D638 Anemia in other chronic diseases classified elsewhere: Secondary | ICD-10-CM

## 2017-02-19 DIAGNOSIS — F17210 Nicotine dependence, cigarettes, uncomplicated: Secondary | ICD-10-CM | POA: Insufficient documentation

## 2017-02-19 DIAGNOSIS — R591 Generalized enlarged lymph nodes: Secondary | ICD-10-CM | POA: Diagnosis not present

## 2017-02-19 DIAGNOSIS — I130 Hypertensive heart and chronic kidney disease with heart failure and stage 1 through stage 4 chronic kidney disease, or unspecified chronic kidney disease: Secondary | ICD-10-CM | POA: Diagnosis not present

## 2017-02-19 DIAGNOSIS — M5137 Other intervertebral disc degeneration, lumbosacral region: Secondary | ICD-10-CM | POA: Diagnosis not present

## 2017-02-19 DIAGNOSIS — R911 Solitary pulmonary nodule: Secondary | ICD-10-CM

## 2017-02-19 DIAGNOSIS — K219 Gastro-esophageal reflux disease without esophagitis: Secondary | ICD-10-CM | POA: Insufficient documentation

## 2017-02-19 DIAGNOSIS — D649 Anemia, unspecified: Secondary | ICD-10-CM

## 2017-02-19 DIAGNOSIS — E1122 Type 2 diabetes mellitus with diabetic chronic kidney disease: Secondary | ICD-10-CM | POA: Insufficient documentation

## 2017-02-19 DIAGNOSIS — I509 Heart failure, unspecified: Secondary | ICD-10-CM | POA: Insufficient documentation

## 2017-02-19 DIAGNOSIS — Z862 Personal history of diseases of the blood and blood-forming organs and certain disorders involving the immune mechanism: Secondary | ICD-10-CM

## 2017-02-19 DIAGNOSIS — R599 Enlarged lymph nodes, unspecified: Secondary | ICD-10-CM

## 2017-02-19 DIAGNOSIS — R918 Other nonspecific abnormal finding of lung field: Secondary | ICD-10-CM

## 2017-02-19 DIAGNOSIS — D509 Iron deficiency anemia, unspecified: Secondary | ICD-10-CM

## 2017-02-19 LAB — CBC
HEMATOCRIT: 25.8 % — AB (ref 40.0–52.0)
HEMOGLOBIN: 8.5 g/dL — AB (ref 13.0–18.0)
MCH: 29.1 pg (ref 26.0–34.0)
MCHC: 32.8 g/dL (ref 32.0–36.0)
MCV: 88.7 fL (ref 80.0–100.0)
Platelets: 250 10*3/uL (ref 150–440)
RBC: 2.91 MIL/uL — ABNORMAL LOW (ref 4.40–5.90)
RDW: 13.4 % (ref 11.5–14.5)
WBC: 5.8 10*3/uL (ref 3.8–10.6)

## 2017-02-19 NOTE — Progress Notes (Signed)
Patient here for follow up with labs. He states that he is feeling well today and denies having any pain. He also reports that his kidney function has improved since his last visit. He is scheduled to follow up with his kidney doctor tomorrow. His energy level is "pretty good". His blood pressure is elevated today, and was confirmed by re-check. Patient was recently taken off of Lisinopril and Imdur and started on Hydralazine by his PMD, Dr. Lamonte Sakai, MD

## 2017-02-19 NOTE — Progress Notes (Signed)
Patient not able to receive Procrit injection today due to elevated bp and he reports that he did not take his bp meds this morning.  Lanetta Inch, Dr. Elroy Channel nurse, notified.  Patient advised to keep his next scheduled appt and make sure to take his medication before.

## 2017-02-21 ENCOUNTER — Encounter: Payer: Medicare HMO | Admitting: Surgery

## 2017-02-21 DIAGNOSIS — E11621 Type 2 diabetes mellitus with foot ulcer: Secondary | ICD-10-CM | POA: Diagnosis not present

## 2017-02-22 NOTE — Progress Notes (Addendum)
NYHEEM, BINETTE ([id number]) Visit Report for 02/21/2017 Chief Complaint Document Details Patient Name: [name], [name] Date of Service: 02/21/2017 8:45 AM Medical Record Number: [id number] Patient Account Number: [id number] Date of Birth/Sex: November 25, 1950 (66 y.o. Male) Treating RN: Montey Hora Primary Care Provider: Myrtie Hawk Other Clinician: Referring Provider: Myrtie Hawk Treating Provider/Extender: Frann Rider in Treatment: 21 Information Obtained from: Patient Chief Complaint Patients presents for treatment of an open diabetic ulcer to both feet Electronic Signature(s) Signed: 02/21/2017 9:24:36 AM By: Christin Fudge MD, FACS Entered By: Christin Fudge on 02/21/2017 09:24:36 Rodell, [name] ([id number]) -------------------------------------------------------------------------------- HPI Details Patient Name: Swails, [name] Date of Service: 02/21/2017 8:45 AM Medical Record Number: [id number] Patient Account Number: [id number] Date of Birth/Sex: [birth year] (66 y.o. Male) Treating RN: Montey Hora Primary Care Provider: Myrtie Hawk Other Clinician: Referring Provider: Myrtie Hawk Treating Provider/Extender: Frann Rider in Treatment: 21 History of Present Illness Location: bilateral feet ulceration on the toes Quality: Patient reports experiencing a dull pain to affected area(s). Severity: Patient states wound are getting better Duration: Patient has had the wound for > 3 months prior to seeking treatment at the wound center Timing: Pain in wound is constant (hurts all the time) Context: The wound would happen gradually Modifying Factors: Other treatment(s) tried include:treatment for lymphedema and is seen by the podiatrist Dr. Caryl Comes Associated Signs and Symptoms: Patient reports having increase swelling. HPI Description: 66 year old patient here to see as for bilateral feet ulceration to on his left first and second  toe and 2 on his right first and second toe, which she's had for about 4 months. 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 Donella Stade. The patient is also undergoing workup by medical oncology for a intra-abdominal lymphadenopathy. 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 He is also status post appendectomy, inguinal lymph node biopsy, rectal biopsy and rectal examination under anesthesia. he currently smokes cigarettes about half packet a day. In March of this year he was seen by Dr. Hortencia Pilar, for evaluation of bilateral varicose veins and besides wearing compression stockings he had recommended laser ablation of the right and left great saphenous veins to eleviate the symptoms and complications of severe superficial venous reflux disease. He also recommended lymphedema pumps for better control of his lymphedema. The patient recently has had on 08/23/2016, right greater saphenous vein ablation with the laser energy Earlier lower extremity venous reflux examination done on 05/08/2016 showed no DVT or SVT both lower legs but incompetence of bilateral great saphenous veins was present. A lower arterial study was also done and there was no significant right lower and left lower extremity problems based on a normal toe brachial index bilaterally and the ABI was 1.21 the left and 1.23 on the right. His post ablation venous duplex examination showed successful ablation of the right GS vein with thrombus formation 2 below the right saphenofemoral junction. The deep system was patent without evidence of thrombosis and this was done on 08/30/2016. the patient also has a squamous cell cancer of the skin of the buttock and is recently undergone radiation  therapy for this prior to excisional surgery. Addendum: regarding his x-rays done today and x-ray of the left foot -- IMPRESSION: No objective evidence of osteomyelitis. There are soft tissue  changes which may reflect cellulitis. X-ray of the right foot -- IMPRESSION:Findings compatible with cellulitis of the toes. No objective evidence of osteomyelitis is observed. 10/01/16 on evaluation today patient's wounds appeared to be doing some better. I did review the x-rays as well which showed no evidence of osteomyelitis although there was evidence on x-ray of cellulitis. He fortunately is not having any discomfort although he continues to have some swelling. He does not remember being on any antibiotics recently. 10/15/16 on evaluation today patient's wounds overall appear to be doing better although he does have a new location noted on the left foot. Fortunately he is not having significant pain. It almost has the appearance that something is rubbing on the end of his toes but he wears the open toe shoes and according to what he is telling me never wears anything that would rub on his foot. There is no evidence of infection and specifically no evidence of a fungal infection 10/22/16 On evaluation today patient's wounds appeared to be doing better compared to last week in regard to his bilateral Reliford, Jabbar E. ([id number]) lower extremities. Fortunately I happy with how things are progressing although he still has ulcers I feel like that he is improving and appropriate manner. 11/12/16 on evaluation today patient appears to be doing well in regard to his bilateral feet and the respective wounds. We have been using surrounding her dressings along with an antifungal cream which seems to be doing very well. He has no bilateral dysfunction noticed that the rituals are weight loss at this point. He also has no nausea or vomiting a note purulent discharge. He did see Vein and vascular today and he tells me that  they told him he could have surgery for his venous stasis but they did not feel like it was worth it in his words. Fortunately patient's wounds do appear to be getting sneakily better. 11/26/2016 -- he says he is going to have some surgery during this week at [facility] for possibly a colon resection. 12/31/2016 -- the patient has been noncompliant with his smoking and I'm not sure whether he is also started drinking again. He continues to be very nonchalant about his care 01/14/2017 -- the patient's HandP has been reviewed well and I understand he is being compliant with trying to give up smoking and his local dressing changes. He does not have any surgical options of 4 to him by his vascular surgeons.he was last seen in early August by Dr. Hortencia Pilar who recommended compression stockings,and possibly lymph pumps in 2-3 months after doing a review ultrasound. 01/28/2017 - the patient did not have any fresh complaints but on examination I noted a large lacerated wound on the plantar aspect of his right fourth toe which had a lot of necrotic debris and it probes down to bone. 02/07/2017 -- x-ray of the right foot -- IMPRESSION: Soft tissue swelling about the first through fourth toes consistent with cellulitis. New destructive change in the tuft of the distal phalanx of the great toe is consistent with osteomyelitis. 02/14/2017 -- the patient's MRI is pending this coming Monday and he still continues to smoke. We have again gone over off loading of his wounds in great detail and he says he's been compliant. 02/21/2017 -- MR of the right foot -- IMPRESSION: 1. Soft tissue ulcer at the tip of the first, second and third toe knows. Cortical irregularity and bone marrow edema in the first distal phalanx most concerning for osteomyelitis. Mild  marrow edema in the second and third distal phalanx without definite cortical destruction which may reflect early osteomyelitis versus reactive marrow  edema. 2. Soft tissue edema surrounding the first phalanx most consistent with cellulitis. the patient was also recently evaluated by his medical oncologist Dr. 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 She is treating him with weekly Procrit. She is also keeping intra-abdominal lymphadenopathy and right lower lobe lung nodule under observation. Electronic Signature(s) Signed: 02/21/2017 9:24:44 AM By: Christin Fudge MD, FACS Previous Signature: 02/21/2017 8:57:41 AM Version By: Christin Fudge MD, FACS Previous Signature: 02/21/2017 8:52:24 AM Version By: Christin Fudge MD, FACS Previous Signature: 02/21/2017 8:48:10 AM Version By: Christin Fudge MD, FACS Previous Signature: 02/21/2017 8:46:44 AM Version By: Christin Fudge MD, FACS Entered By: Christin Fudge on 02/21/2017 09:24:44 Rubens, [name] ([id number]) -------------------------------------------------------------------------------- Physical Exam Details Patient Name: Kerkman, Rael E. Date of Service: 02/21/2017 8:45 AM Medical Record Number: [id number] Patient Account Number: [id number] Date of Birth/Sex: June 17, 1950 (66 y.o. Male) Treating RN: Montey Hora Primary Care Provider: Myrtie Hawk Other Clinician: Referring Provider: Myrtie Hawk Treating Provider/Extender: Frann Rider in Treatment: 21 Constitutional . Pulse regular. Respirations normal and unlabored. Afebrile. . Eyes Nonicteric. Reactive to light. Ears, Nose, Mouth, and Throat Lips, teeth, and gums WNL.Marland Kitchen Moist mucosa without lesions. Neck supple and nontender. No palpable supraclavicular or cervical adenopathy. Normal sized without goiter. Respiratory WNL. No retractions.. Cardiovascular Pedal Pulses WNL. No clubbing, cyanosis or edema. Lymphatic No adneopathy. No adenopathy. No adenopathy. Musculoskeletal Adexa without tenderness or enlargement.. Digits and nails w/o  clubbing, cyanosis, infection, petechiae, ischemia, or inflammatory conditions.. Integumentary (Hair, Skin) No suspicious lesions. No crepitus or fluctuance. No peri-wound warmth or erythema. No masses.Marland Kitchen Psychiatric Judgement and insight Intact.. No evidence of depression, anxiety, or agitation.. Notes all his wounds look much better and on careful examination there is no probing down to bone at any of these areas. No sharp debridement was required today. Electronic Signature(s) Signed: 02/21/2017 9:25:24 AM By: Christin Fudge MD, FACS Entered By: Christin Fudge on 02/21/2017 09:25:24 Maclay, [name] ([id number]) -------------------------------------------------------------------------------- Physician Orders Details Patient Name: Stiner, Jaymere E. Date of Service: 02/21/2017 8:45 AM Medical Record Number: [id number] Patient Account Number: [id number] Date of Birth/Sex: 09/06/1950 (65 y.o. Male) Treating RN: Montey Hora Primary Care Provider: Myrtie Hawk Other Clinician: Referring Provider: Myrtie Hawk Treating Provider/Extender: Frann Rider in Treatment: 23 Verbal / Phone Orders: No Diagnosis Coding Wound Cleansing Wound #1 Right Toe Great o Clean wound with Normal Saline. o May Shower, gently pat wound dry prior to applying new dressing. Wound #10 Right Toe Fourth o Clean wound with Normal Saline. o May Shower, gently pat wound dry prior to applying new dressing. Wound #2 Right Toe Second o Clean wound with Normal Saline. o May Shower, gently pat wound dry prior to applying new dressing. Wound #5 Left Toe Great o Clean wound with Normal Saline. o May Shower, gently pat wound dry prior to applying new dressing. Wound #6 Left Toe Second o Clean wound with Normal Saline. o May Shower, gently pat wound dry prior to applying new dressing. Wound #8 Left Toe Third o Clean wound with Normal Saline. o May Shower, gently pat wound  dry prior to applying new dressing. Wound #9 Right Toe Third o Clean wound with Normal Saline. o May Shower, gently pat wound dry prior to applying new dressing. Anesthetic Wound #1 Right Toe Great o Topical Lidocaine  4% cream applied to wound bed prior to debridement Wound #10 Right Toe Fourth o Topical Lidocaine 4% cream applied to wound bed prior to debridement Wound #2 Right Toe Second o Topical Lidocaine 4% cream applied to wound bed prior to debridement Wound #5 Left Toe Great o Topical Lidocaine 4% cream applied to wound bed prior to debridement Wound #6 Left Toe Second o Topical Lidocaine 4% cream applied to wound bed prior to debridement Trego, Vera E. ([id number]) Wound #8 Left Toe Third o Topical Lidocaine 4% cream applied to wound bed prior to debridement Wound #9 Right Toe Third o Topical Lidocaine 4% cream applied to wound bed prior to debridement Primary Wound Dressing Wound #1 Right Toe Great o Silvercel Non-Adherent o Other: - nystatin Wound #10 Right Toe Fourth o Silvercel Non-Adherent o Other: - nystatin Wound #2 Right Toe Second o Silvercel Non-Adherent o Other: - nystatin Wound #5 Left Toe Great o Silvercel Non-Adherent o Other: - nystatin Wound #6 Left Toe Second o Silvercel Non-Adherent o Other: - nystatin Wound #8 Left Toe Third o Silvercel Non-Adherent o Other: - nystatin Wound #9 Right Toe Third o Silvercel Non-Adherent o Other: - nystatin Secondary Dressing Wound #1 Right Toe Great o Gauze and Kerlix/Conform Wound #10 Right Toe Fourth o Gauze and Kerlix/Conform Wound #2 Right Toe Second o Gauze and Kerlix/Conform Wound #5 Left Toe Great o Gauze and Kerlix/Conform Wound #6 Left Toe Second o Gauze and Kerlix/Conform Wound #8 Left Toe Third o Gauze and Kerlix/Conform Wound #9 Right Toe Third o Gauze and Kerlix/Conform Swails, Gregory E. ([id number]) Dressing Change  Frequency Wound #1 Right Toe Great o Change dressing every day. Wound #10 Right Toe Fourth o Change dressing every day. Wound #2 Right Toe Second o Change dressing every day. Wound #5 Left Toe Great o Change dressing every day. Wound #6 Left Toe Second o Change dressing every day. Wound #8 Left Toe Third o Change dressing every day. Wound #9 Right Toe Third o Change dressing every day. Follow-up Appointments Wound #1 Right Toe Great o Return Appointment in 1 week. Wound #10 Right Toe Fourth o Return Appointment in 1 week. Wound #2 Right Toe Second o Return Appointment in 1 week. Wound #5 Left Toe Great o Return Appointment in 1 week. Wound #6 Left Toe Second o Return Appointment in 1 week. Wound #8 Left Toe Third o Return Appointment in 1 week. Wound #9 Right Toe Third o Return Appointment in 1 week. Additional Orders / Instructions Wound #1 Right Toe Great o Stop Smoking o Increase protein intake. o Other: - Please add vitamin A, vitamin C and zinc supplements to your diet Wound #10 Right Toe Fourth o Stop Smoking o Increase protein intake. o Other: - Please add vitamin A, vitamin C and zinc supplements to your diet Marcom, Jadavion E. ([id number]) Wound #2 Right Toe Second o Stop Smoking o Increase protein intake. o Other: - Please add vitamin A, vitamin C and zinc supplements to your diet Wound #5 Left Toe Great o Stop Smoking o Increase protein intake. o Other: - Please add vitamin A, vitamin C and zinc supplements to your diet Wound #6 Left Toe Second o Stop Smoking o Increase protein intake. o Other: - Please add vitamin A, vitamin C and zinc supplements to your diet Wound #8 Left Toe Third o Stop Smoking o Increase protein intake. o Other: - Please add vitamin A, vitamin C and zinc supplements to your diet Wound #9 Right Toe Third o  Stop Smoking o Increase protein intake. o Other: -  Please add vitamin A, vitamin C and zinc supplements to your diet Consults o Infectious Disease Electronic Signature(s) Signed: 02/21/2017 11:55:06 AM By: Christin Fudge MD, FACS Signed: 02/22/2017 5:02:13 PM By: Montey Hora Entered By: Montey Hora on 02/21/2017 09:06:59 Filley, [name] ([id number]) -------------------------------------------------------------------------------- Problem List Details Patient Name: Strother, Rollie E. Date of Service: 02/21/2017 8:45 AM Medical Record Number: [id number] Patient Account Number: [id number] Date of Birth/Sex: 1950-12-08 (66 y.o. Male) Treating RN: Montey Hora Primary Care Provider: Myrtie Hawk Other Clinician: Referring Provider: Myrtie Hawk Treating Provider/Extender: Frann Rider in Treatment: 21 Active Problems ICD-10 Encounter Code Description Active Date Diagnosis E11.621 Type 2 diabetes mellitus with foot ulcer 09/21/2016 Yes I87.313 Chronic venous hypertension (idiopathic) with ulcer of bilateral 09/21/2016 Yes lower extremity I89.0 Lymphedema, not elsewhere classified 09/21/2016 Yes L97.522 Non-pressure chronic ulcer of other part of left foot with fat layer 09/21/2016 Yes exposed L97.512 Non-pressure chronic ulcer of other part of right foot with fat layer 09/21/2016 Yes exposed F17.218 Nicotine dependence, cigarettes, with other nicotine-induced 09/21/2016 Yes disorders F10.19 Alcohol abuse with unspecified alcohol-induced disorder 09/21/2016 Yes M86.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, right ankle and foot 02/21/2017 Yes Inactive Problems Resolved Problems Electronic Signature(s) Signed: 02/21/2017 9:26:43 AM By: Christin Fudge MD, FACS Previous Signature: 02/21/2017 9:24:24 AM Version By: Christin Fudge MD, FACS Entered By: Christin Fudge on 02/21/2017 09:26:43 Soltys, Augusten E. ([id number]) -------------------------------------------------------------------------------- Progress Note  Details Patient Name: Fedorko, Stephane E. Date of Service: 02/21/2017 8:45 AM Medical Record Number: [id number] Patient Account Number: [id number] Date of Birth/Sex: 01/11/1951 (66 y.o. Male) Treating RN: Montey Hora Primary Care Provider: Myrtie Hawk Other Clinician: Referring Provider: Myrtie Hawk Treating Provider/Extender: Frann Rider in Treatment: 21 Subjective Chief Complaint Information obtained from Patient Patients presents for treatment of an open diabetic ulcer to both feet History of Present Illness (HPI) The following HPI elements were documented for the patient's wound: Location: bilateral feet ulceration on the toes Quality: Patient reports experiencing a dull pain to affected area(s). Severity: Patient states wound are getting better Duration: Patient has had the wound for > 3 months prior to seeking treatment at the wound center Timing: Pain in wound is constant (hurts all the time) Context: The wound would happen gradually Modifying Factors: Other treatment(s) tried include:treatment for lymphedema and is seen by the podiatrist Dr. Caryl Comes Associated Signs and Symptoms: Patient reports having increase swelling. 66 year old patient here to see as for bilateral feet ulceration to on his left first and second toe and 2 on his right first and second toe, which she's had for about 4 months. 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 Donella Stade. The patient is also undergoing workup by medical oncology for a intra-abdominal lymphadenopathy. 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 He is also status post appendectomy, inguinal lymph node biopsy, rectal biopsy and rectal examination under anesthesia. he  currently smokes cigarettes about half packet a day. In March of this year he was seen by Dr. Hortencia Pilar, for evaluation of bilateral varicose veins and besides wearing compression stockings he had recommended laser ablation of the right and left great saphenous veins to eleviate the symptoms and complications of severe superficial venous reflux disease. He also recommended lymphedema pumps for  better control of his lymphedema. The patient recently has had on 08/23/2016, right greater saphenous vein ablation with the laser energy Earlier lower extremity venous reflux examination done on 05/08/2016 showed no DVT or SVT both lower legs but incompetence of bilateral great saphenous veins was present. A lower arterial study was also done and there was no significant right lower and left lower extremity problems based on a normal toe brachial index bilaterally and the ABI was 1.21 the left and 1.23 on the right. His post ablation venous duplex examination showed successful ablation of the right GS vein with thrombus formation 2 below the right saphenofemoral junction. The deep system was patent without evidence of thrombosis and this was done on 08/30/2016. 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 Addendum: regarding his x-rays done today and x-ray of the left foot -- IMPRESSION: No objective evidence of osteomyelitis. There are soft tissue changes which may reflect cellulitis. X-ray of the right foot -- IMPRESSION:Findings compatible with cellulitis of the toes. No objective evidence of osteomyelitis is observed. 10/01/16 on evaluation today patient's wounds appeared to be doing some better. I did review the x-rays as well which showed no evidence of osteomyelitis although there was evidence on x-ray of cellulitis. He fortunately is not having any discomfort Knies, Jermayne E.  ([id number]) although he continues to have some swelling. He does not remember being on any antibiotics recently. 10/15/16 on evaluation today patient's wounds overall appear to be doing better although he does have a new location noted on the left foot. Fortunately he is not having significant pain. It almost has the appearance that something is rubbing on the end of his toes but he wears the open toe shoes and according to what he is telling me never wears anything that would rub on his foot. There is no evidence of infection and specifically no evidence of a fungal infection 10/22/16 On evaluation today patient's wounds appeared to be doing better compared to last week in regard to his bilateral lower extremities. Fortunately I happy with how things are progressing although he still has ulcers I feel like that he is improving and appropriate manner. 11/12/16 on evaluation today patient appears to be doing well in regard to his bilateral feet and the respective wounds. We have been using surrounding her dressings along with an antifungal cream which seems to be doing very well. He has no bilateral dysfunction noticed that the rituals are weight loss at this point. He also has no nausea or vomiting a note purulent discharge. He did see Vein and vascular today and he tells me that they told him he could have surgery for his venous stasis but they did not feel like it was worth it in his words. Fortunately patient's wounds do appear to be getting sneakily better. 11/26/2016 -- he says he is going to have some surgery during this week at [facility] for possibly a colon resection. 12/31/2016 -- the patient has been noncompliant with his smoking and I'm not sure whether he is also started drinking again. He continues to be very nonchalant about his care 01/14/2017 -- the patient's HandP has been reviewed well and I understand he is being compliant with trying to give up smoking and his local dressing  changes. He does not have any surgical options of 4 to him by his vascular surgeons.he was last seen in early August  by Dr. Hortencia Pilar who recommended compression stockings,and possibly lymph pumps in 2-3 months after doing a review ultrasound. 01/28/2017 - the patient did not have any fresh complaints but on examination I noted a large lacerated wound on the plantar aspect of his right fourth toe which had a lot of necrotic debris and it probes down to bone. 02/07/2017 -- x-ray of the right foot -- IMPRESSION: Soft tissue swelling about the first through fourth toes consistent with cellulitis. New destructive change in the tuft of the distal phalanx of the great toe is consistent with osteomyelitis. 02/14/2017 -- the patient's MRI is pending this coming Monday and he still continues to smoke. We have again gone over off loading of his wounds in great detail and he says he's been compliant. 02/21/2017 -- MR of the right foot -- IMPRESSION: 1. Soft tissue ulcer at the tip of the first, second and third toe knows. Cortical irregularity and bone marrow edema in the first distal phalanx most concerning for osteomyelitis. Mild marrow edema in the second and third distal phalanx without definite cortical destruction which may reflect early osteomyelitis versus reactive marrow edema. 2. Soft tissue edema surrounding the first phalanx most consistent with cellulitis. the patient was also recently evaluated by his medical oncologist Dr. 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 She is treating him with weekly Procrit. She is also keeping intra-abdominal lymphadenopathy and right lower lobe lung nodule under observation. Patient History Information obtained from Patient. Social History Current every day smoker, Marital Status - Widowed, Alcohol Use - Daily - quit drinking about a week ago, Drug Use - No History, Caffeine Use -  Moderate. 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 ([id number]) Objective Constitutional Pulse regular. Respirations normal and unlabored. Afebrile. Vitals Time Taken: 8:47 AM, Height: 69 in, Weight: 168 lbs, BMI: 24.8, Temperature: 98.0 F, Pulse: 68 bpm, Respiratory Rate: 18 breaths/min, Blood Pressure: 155/74 mmHg. Eyes Nonicteric. Reactive to light. Ears, Nose, Mouth, and Throat Lips, teeth, and gums WNL.Marland Kitchen Moist mucosa without lesions. Neck supple and nontender. No palpable supraclavicular or cervical adenopathy. Normal sized without goiter. Respiratory WNL. No retractions.. Cardiovascular Pedal Pulses WNL. No clubbing, cyanosis or edema. Lymphatic No adneopathy. No adenopathy. No adenopathy. Musculoskeletal Adexa without tenderness or enlargement.. Digits and nails w/o clubbing, cyanosis, infection, petechiae, ischemia, or inflammatory conditions.Marland Kitchen Psychiatric Judgement and insight Intact.. No evidence of depression, anxiety, or agitation.. General Notes: all his wounds look much better and on careful examination there is no probing down to bone at any of these areas. No sharp debridement was required today. Integumentary (Hair, Skin) No suspicious lesions. No crepitus or fluctuance. No peri-wound warmth or erythema. No masses.. Wound #1 status is Open. Original cause of wound was Gradually Appeared. The wound is located on the Right Toe Great. The wound measures 1.5cm length x 1.6cm width x 0.1cm depth; 1.885cm^2 area and 0.188cm^3 volume. There is Fat Layer (Subcutaneous Tissue) Exposed exposed. There is a large amount of serosanguineous drainage noted. The wound margin is flat and intact. There is medium (34-66%) pink granulation within the wound bed. There is a medium (34-66%) amount of necrotic tissue within the wound bed including Adherent Slough. The periwound skin appearance exhibited:  Excoriation, Maceration. The periwound skin appearance did not exhibit: Callus, Crepitus, Induration, Rash, Scarring, Dry/Scaly, Atrophie Blanche, Cyanosis, Ecchymosis, Hemosiderin Staining, Mottled, Pallor, Rubor, Erythema.  Periwound temperature was noted as No Abnormality. Wound #10 status is Open. Original cause of wound was Not Known. The wound is located on the Right Toe Fourth. The wound measures 0.5cm length x 1.6cm width x 0.4cm depth; 0.628cm^2 area and 0.251cm^3 volume. There is Fat Layer Brandner, Jaylen E. ([id number]) (Subcutaneous Tissue) Exposed exposed. There is a large amount of serous drainage noted. The wound margin is flat and intact. There is small (1-33%) red granulation within the wound bed. There is a large (67-100%) amount of necrotic tissue within the wound bed including Eschar and Adherent Slough. The periwound skin appearance exhibited: Maceration. The periwound skin appearance did not exhibit: Callus, Crepitus, Excoriation, Induration, Rash, Scarring, Dry/Scaly, Atrophie Blanche, Cyanosis, Ecchymosis, Hemosiderin Staining, Mottled, Pallor, Rubor, Erythema. Periwound temperature was noted as No Abnormality. Wound #2 status is Open. Original cause of wound was Gradually Appeared. The wound is located on the Right Toe Second. The wound measures 0.6cm length x 0.7cm width x 0.1cm depth; 0.33cm^2 area and 0.033cm^3 volume. There is Fat Layer (Subcutaneous Tissue) Exposed exposed. There is a large amount of serosanguineous drainage noted. The wound margin is flat and intact. There is medium (34-66%) pink granulation within the wound bed. There is a medium (34-66%) amount of necrotic tissue within the wound bed including Adherent Slough. The periwound skin appearance exhibited: Excoriation, Maceration. The periwound skin appearance did not exhibit: Callus, Crepitus, Induration, Rash, Scarring, Dry/Scaly, Atrophie Blanche, Cyanosis, Ecchymosis, Hemosiderin Staining, Mottled,  Pallor, Rubor, Erythema. Periwound temperature was noted as No Abnormality. Wound #5 status is Open. Original cause of wound was Gradually Appeared. The wound is located on the Left Toe Great. The wound measures 0.4cm length x 0.2cm width x 0.1cm depth; 0.063cm^2 area and 0.006cm^3 volume. There is Fat Layer (Subcutaneous Tissue) Exposed exposed. There is a large amount of serous drainage noted. Foul odor after cleansing was noted. The wound margin is flat and intact. There is medium (34-66%) pink granulation within the wound bed. There is a medium (34-66%) amount of necrotic tissue within the wound bed including Adherent Slough. The periwound skin appearance exhibited: Excoriation, Maceration. The periwound skin appearance did not exhibit: Callus, Crepitus, Induration, Rash, Scarring, Dry/Scaly, Atrophie Blanche, Cyanosis, Ecchymosis, Hemosiderin Staining, Mottled, Pallor, Rubor, Erythema. Periwound temperature was noted as No Abnormality. Wound #6 status is Open. Original cause of wound was Gradually Appeared. The wound is located on the Left Toe Second. The wound measures 0.7cm length x 0.7cm width x 0.1cm depth; 0.385cm^2 area and 0.038cm^3 volume. There is Fat Layer (Subcutaneous Tissue) Exposed exposed. There is a large amount of serous drainage noted. The wound margin is flat and intact. There is medium (34-66%) pink granulation within the wound bed. There is a medium (34-66%) amount of necrotic tissue within the wound bed including Adherent Slough. The periwound skin appearance exhibited: Excoriation, Maceration. The periwound skin appearance did not exhibit: Callus, Crepitus, Induration, Rash, Scarring, Dry/Scaly, Atrophie Blanche, Cyanosis, Ecchymosis, Hemosiderin Staining, Mottled, Pallor, Rubor, Erythema. Periwound temperature was noted as No Abnormality. Wound #8 status is Open. Original cause of wound was Gradually Appeared. The wound is located on the Left Toe Third. The wound  measures 0.1cm length x 0.1cm width x 0.1cm depth; 0.008cm^2 area and 0.001cm^3 volume. There is a large amount of serosanguineous drainage noted. The wound margin is distinct with the outline attached to the wound base. There is medium (34-66%) red granulation within the wound bed. There is a medium (34-66%) amount of necrotic tissue within the wound bed including  Adherent Slough. The periwound skin appearance exhibited: Maceration. Periwound temperature was noted as No Abnormality. The periwound has tenderness on palpation. Wound #9 status is Open. Original cause of wound was Gradually Appeared. The wound is located on the Right Toe Third. The wound measures 0.4cm length x 0.3cm width x 0.1cm depth; 0.094cm^2 area and 0.009cm^3 volume. There is a large amount of serous drainage noted. Foul odor after cleansing was noted. The wound margin is flat and intact. There is medium (34-66%) pink granulation within the wound bed. There is a medium (34-66%) amount of necrotic tissue within the wound bed including Adherent Slough. The periwound skin appearance exhibited: Maceration. The periwound skin appearance did not exhibit: Callus, Crepitus, Excoriation, Induration, Rash, Scarring, Dry/Scaly, Atrophie Blanche, Cyanosis, Ecchymosis, Hemosiderin Staining, Mottled, Pallor, Rubor, Erythema. Periwound temperature was noted as No Abnormality. Assessment Active Problems ICD-10 E11.621 - Type 2 diabetes mellitus with foot ulcer I87.313 - Chronic venous hypertension (idiopathic) with ulcer of bilateral lower extremity Wedel, Sofia E. ([id number]) I89.0 - Lymphedema, not elsewhere classified L97.522 - Non-pressure chronic ulcer of other part of left foot with fat layer exposed L97.512 - Non-pressure chronic ulcer of other part of right foot with fat layer exposed F17.218 - Nicotine dependence, cigarettes, with other nicotine-induced disorders F10.19 - Alcohol abuse with unspecified alcohol-induced  disorder M86.371 - Chronic multifocal osteomyelitis, right ankle and foot this 66 year old gentleman with multiple comorbidities including diabetes mellitus, chronic venous hypertension, nicotine abuse and chronic anemia now has had all appropriate workup, including arterial and venous studies over the last year. His arterial workup was within normal limits and his venous insufficiency has been treated with laser ablation. The wounds on both feet were improving significantly and a recent plain x-ray and MRI now confirm findings of chronic osteomyelitis in 3 toes on his right foot. There is no bone easily accessible for biopsy and at this stage I have recommended an opinion from Dr. Adrian Prows of infectious disease. An option would be to do a interventional radiology guided biopsy of the bone to establish the culture and sensitivity of the organisms. I have explained this to the patient and his caregiver and they are agreeable to be compliant. in the meanwhile I have recommended: 1. packing this with Silver alginate and an appropriate foam off loading. 2. the patient is due to have a vascular opinion soon regarding his studies status post endovenous ablation. He may also benefit from a lymph pump at that time. We will await his vascular opinion soon. 3. I have encouraged him to wear his compression stockings previously advised 4. Completely give up smoking Plan Wound Cleansing: Wound #1 Right Toe Great: Clean wound with Normal Saline. May Shower, gently pat wound dry prior to applying new dressing. Wound #10 Right Toe Fourth: Clean wound with Normal Saline. May Shower, gently pat wound dry prior to applying new dressing. Wound #2 Right Toe Second: Clean wound with Normal Saline. May Shower, gently pat wound dry prior to applying new dressing. Wound #5 Left Toe Great: Clean wound with Normal Saline. May Shower, gently pat wound dry prior to applying new dressing. Wound #6 Left Toe  Second: Clean wound with Normal Saline. May Shower, gently pat wound dry prior to applying new dressing. Wound #8 Left Toe Third: Clean wound with Normal Saline. May Shower, gently pat wound dry prior to applying new dressing. Wound #9 Right Toe Third: Clean wound with Normal Saline. May Shower, gently pat wound dry prior to applying new dressing. Anesthetic: Wound #  1 Right Toe GreatKEYMARI, SATO. ([id number]) Topical Lidocaine 4% cream applied to wound bed prior to debridement Wound #10 Right Toe Fourth: Topical Lidocaine 4% cream applied to wound bed prior to debridement Wound #2 Right Toe Second: Topical Lidocaine 4% cream applied to wound bed prior to debridement Wound #5 Left Toe Great: Topical Lidocaine 4% cream applied to wound bed prior to debridement Wound #6 Left Toe Second: Topical Lidocaine 4% cream applied to wound bed prior to debridement Wound #8 Left Toe Third: Topical Lidocaine 4% cream applied to wound bed prior to debridement Wound #9 Right Toe Third: Topical Lidocaine 4% cream applied to wound bed prior to debridement Primary Wound Dressing: Wound #1 Right Toe Great: Silvercel Non-Adherent Other: - nystatin Wound #10 Right Toe Fourth: Silvercel Non-Adherent Other: - nystatin Wound #2 Right Toe Second: Silvercel Non-Adherent Other: - nystatin Wound #5 Left Toe Great: Silvercel Non-Adherent Other: - nystatin Wound #6 Left Toe Second: Silvercel Non-Adherent Other: - nystatin Wound #8 Left Toe Third: Silvercel Non-Adherent Other: - nystatin Wound #9 Right Toe Third: Silvercel Non-Adherent Other: - nystatin Secondary Dressing: Wound #1 Right Toe Great: Gauze and Kerlix/Conform Wound #10 Right Toe Fourth: Gauze and Kerlix/Conform Wound #2 Right Toe Second: Gauze and Kerlix/Conform Wound #5 Left Toe Great: Gauze and Kerlix/Conform Wound #6 Left Toe Second: Gauze and Kerlix/Conform Wound #8 Left Toe Third: Gauze and Kerlix/Conform Wound #9  Right Toe Third: Gauze and Kerlix/Conform Dressing Change Frequency: Wound #1 Right Toe Great: Change dressing every day. Wound #10 Right Toe Fourth: Change dressing every day. Wound #2 Right Toe Second: Change dressing every day. Wound #5 Left Toe Great: Change dressing every day. Wound #6 Left Toe Second: [place]oto, Edrick E. ([id number]) Change dressing every day. Wound #8 Left Toe Third: Change dressing every day. Wound #9 Right Toe Third: Change dressing every day. Follow-up Appointments: Wound #1 Right Toe Great: Return Appointment in 1 week. Wound #10 Right Toe Fourth: Return Appointment in 1 week. Wound #2 Right Toe Second: Return Appointment in 1 week. Wound #5 Left Toe Great: Return Appointment in 1 week. Wound #6 Left Toe Second: Return Appointment in 1 week. Wound #8 Left Toe Third: Return Appointment in 1 week. Wound #9 Right Toe Third: Return Appointment in 1 week. Additional Orders / Instructions: Wound #1 Right Toe Great: Stop Smoking Increase protein intake. Other: - Please add vitamin A, vitamin C and zinc supplements to your diet Wound #10 Right Toe Fourth: Stop Smoking Increase protein intake. Other: - Please add vitamin A, vitamin C and zinc supplements to your diet Wound #2 Right Toe Second: Stop Smoking Increase protein intake. Other: - Please add vitamin A, vitamin C and zinc supplements to your diet Wound #5 Left Toe Great: Stop Smoking Increase protein intake. Other: - Please add vitamin A, vitamin C and zinc supplements to your diet Wound #6 Left Toe Second: Stop Smoking Increase protein intake. Other: - Please add vitamin A, vitamin C and zinc supplements to your diet Wound #8 Left Toe Third: Stop Smoking Increase protein intake. Other: - Please add vitamin A, vitamin C and zinc supplements to your diet Wound #9 Right Toe Third: Stop Smoking Increase protein intake. Other: - Please add vitamin A, vitamin C and zinc supplements to your  diet Consults ordered were: Infectious Disease this 66 year old gentleman with multiple comorbidities including diabetes mellitus, chronic venous hypertension, nicotine abuse and chronic anemia now has had all appropriate workup, including arterial and venous studies over the last year. His  Gladden, Yusef E. ([id number]) arterial workup was within normal limits and his venous insufficiency has been treated with laser ablation. The wounds on both feet were improving significantly and a recent plain x-ray and MRI now confirm findings of chronic osteomyelitis in 3 toes on his right foot. There is no bone easily accessible for biopsy and at this stage I have recommended an opinion from Dr. Adrian Prows of infectious disease. An option would be to do a interventional radiology guided biopsy of the bone to establish the culture and sensitivity of the organisms. I have explained this to the patient and his caregiver and they are agreeable to be compliant. in the meanwhile I have recommended: 1. packing this with Silver alginate and an appropriate foam off loading. 2. the patient is due to have a vascular opinion soon regarding his studies status post endovenous ablation. He may also benefit from a lymph pump at that time. We will await his vascular opinion soon. 3. I have encouraged him to wear his compression stockings previously advised 4. Completely give up smoking 5. Electronic Signature(s) Signed: 02/21/2017 9:31:49 AM By: Christin Fudge MD, FACS Entered By: Christin Fudge on 02/21/2017 09:31:49 Zody, [name] ([id number]) -------------------------------------------------------------------------------- ROS/PFSH Details Patient Name: Simer, Plumer E. Date of Service: 02/21/2017 8:45 AM Medical Record Number: [id number] Patient Account Number: [id number] Date of Birth/Sex: [birth year] (66 y.o. Male) Treating RN: Montey Hora Primary Care Provider: Myrtie Hawk Other  Clinician: Referring Provider: Myrtie Hawk Treating Provider/Extender: Frann Rider in Treatment: 21 Information Obtained From Patient Wound History Do you currently have one or more open woundso Yes How many open wounds do you currently haveo 6 Approximately how long have you had your woundso 3 months How have you been treating your wound(s) until nowo ointment and bandage Has your wound(s) ever healed and then re-openedo No Have you had any lab work done in the past montho No Have you tested positive for an antibiotic resistant organism (MRSA, VRE)o No Have you tested positive for osteomyelitis (bone infection)o No Have you had any tests for circulation on your legso Yes Who ordered the testo PCP Where was the test doneo [facility] Medical History: Negative for: Cataracts; Glaucoma; Optic Neuritis Ear/Nose/Mouth/Throat Medical History: Negative for: Chronic sinus problems/congestion; Middle ear problems Hematologic/Lymphatic Medical History: Positive for: Anemia; Lymphedema Negative for: Hemophilia; Human Immunodeficiency Virus; Sickle Cell Disease Respiratory Medical History: Negative for: Aspiration; Asthma; Chronic Obstructive Pulmonary Disease (COPD); Pneumothorax; Sleep Apnea; Tuberculosis Cardiovascular Medical History: Positive for: Congestive Heart Failure; Hypertension; Peripheral Venous Disease Negative for: Angina; Arrhythmia; Coronary Artery Disease; Deep Vein Thrombosis; Hypotension; Myocardial Infarction; Peripheral Arterial Disease; Phlebitis; Vasculitis Gastrointestinal Medical History: Negative for: Cirrhosis ; Colitis; Crohnos; Hepatitis A; Hepatitis B; Hepatitis C Disanti, Taeshaun E. ([id number]) Endocrine Medical History: Positive for: Type II Diabetes Treated with: Oral agents Blood sugar tested every day: Yes Tested : QD Genitourinary Medical History: Negative for: End Stage Renal Disease Immunological Medical History: Negative  for: Lupus Erythematosus; Raynaudos; Scleroderma Integumentary (Skin) Medical History: Negative for: History of Burn; History of pressure wounds Musculoskeletal Medical History: Negative for: Gout; Rheumatoid Arthritis; Osteoarthritis; Osteomyelitis Neurologic Medical History: Positive for: Neuropathy Negative for: Dementia; Quadriplegia; Paraplegia; Seizure Disorder Oncologic Medical History: Past Medical History Notes: 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 Electronic Signature(s) Signed: 02/21/2017 11:55:06 AM By: Christin Fudge MD, FACS Signed: 02/22/2017 5:02:13 PM By: Deniece Ree, [name] ([id number]) Entered By: Christin Fudge on 02/21/2017 09:24:51 Raburn, [name] ([id number]) -------------------------------------------------------------------------------- SuperBill Details Patient Name: Kimmet, Cuthbert E. Date of Service: 02/21/2017 Medical Record Number: [id number] Patient Account Number: [id number] Date of Birth/Sex: 07/08/1950 (66 y.o. Male) Treating RN: Montey Hora Primary Care Provider: Myrtie Hawk Other Clinician: Referring Provider: Myrtie Hawk Treating Provider/Extender: Frann Rider in Treatment: 21 Diagnosis Coding ICD-10 Codes Code Description E11.621 Type 2 diabetes mellitus with foot ulcer I87.313 Chronic venous hypertension  (idiopathic) with ulcer of bilateral lower extremity I89.0 Lymphedema, not elsewhere classified L97.522 Non-pressure chronic ulcer of other part of left foot with fat layer exposed L97.512 Non-pressure chronic ulcer of other part of right foot with fat layer exposed F17.218 Nicotine dependence, cigarettes, 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 abuse 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, right ankle and foot Facility Procedures CPT4 Code: [id number] Description: 03833 - WOUND CARE VISIT-LEV 5 EST PT Modifier: Quantity: 1 Physician Procedures CPT4 Code Description: [id number] - WC PHYS LEVEL 4 - EST PT ICD-10 Diagnosis Description E11.621 Type 2 diabetes mellitus with foot ulcer I87.313 Chronic venous hypertension (idiopathic) with ulcer of bilateral I89.0 Lymphedema, not elsewhere  classified M86.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, right ankle and foot Modifier: lower extremi Quantity: 1 ty Electronic Signature(s) Signed: 02/21/2017 11:55:06 AM By: Christin Fudge MD, FACS Signed: 02/22/2017 5:02:13 PM By: Montey Hora Previous Signature: 02/21/2017 9:35:15 AM Version By: Christin Fudge MD, FACS Entered By: Montey Hora on 02/21/2017 10:04:11

## 2017-02-24 NOTE — Progress Notes (Signed)
OLUWATOBI, VISSER ([id number]) Visit Report for 02/21/2017 Arrival Information Details Patient Name: [name], [name] Date of Service: 02/21/2017 8:45 AM Medical Record Number: [id number] Patient Account Number: [id number] Date of Birth/Sex: 1950-04-20 (66 y.o. Male) Treating RN: Montey Hora Primary Care Yomaira Solar: Myrtie Hawk Other Clinician: Referring Meelah Tallo: Myrtie Hawk Treating Opha Mcghee/Extender: Frann Rider in Treatment: 21 Visit Information History Since Last Visit Added or deleted any medications: No Patient Arrived: Ambulatory Any new allergies or adverse reactions: No Arrival Time: 08:45 Had a fall or experienced change in No Accompanied By: brother activities of daily living that may affect Transfer Assistance: None risk of falls: Patient Identification Verified: Yes Signs or symptoms of abuse/neglect since last visito No Secondary Verification Process Completed: Yes Hospitalized since last visit: No Patient Requires Transmission-Based No Has Dressing in Place as Prescribed: Yes Precautions: Pain Present Now: No Patient Has Alerts: Yes Patient Alerts: DMII Electronic Signature(s) Signed: 02/22/2017 5:02:13 PM By: Montey Hora Entered By: Montey Hora on 02/21/2017 08:46:16 [name], [name] ([id number]) -------------------------------------------------------------------------------- Clinic Level of Care Assessment Details Patient Name: [name], [name] Date of Service: 02/21/2017 8:45 AM Medical Record Number: [id number] Patient Account Number: [id number] Date of Birth/Sex: [birth year] (66 y.o. Male) Treating RN: Montey Hora Primary Care Jovita Persing: Myrtie Hawk Other Clinician: Referring Treshun Wold: Myrtie Hawk Treating Damieon Armendariz/Extender: Frann Rider in Treatment: 21 Clinic Level of Care Assessment Items TOOL 4 Quantity Score []  - Use when only an EandM is performed on FOLLOW-UP visit 0 ASSESSMENTS -  Nursing Assessment / Reassessment X - Reassessment of Co-morbidities (includes updates in patient status) 1 10 X- 1 5 Reassessment of Adherence to Treatment Plan ASSESSMENTS - Wound and Skin Assessment / Reassessment []  - Simple Wound Assessment / Reassessment - one wound 0 X- 7 5 Complex Wound Assessment / Reassessment - multiple wounds []  - 0 Dermatologic / Skin Assessment (not related to wound area) ASSESSMENTS - Focused Assessment []  - Circumferential Edema Measurements - multi extremities 0 []  - 0 Nutritional Assessment / Counseling / Intervention X- 1 5 Lower Extremity Assessment (monofilament, tuning fork, pulses) []  - 0 Peripheral Arterial Disease Assessment (using hand held doppler) ASSESSMENTS - Ostomy and/or Continence Assessment and Care []  - Incontinence Assessment and Management 0 []  - 0 Ostomy Care Assessment and Management (repouching, etc.) PROCESS - Coordination of Care X - Simple Patient / Family Education for ongoing care 1 15 []  - 0 Complex (extensive) Patient / Family Education for ongoing care []  - 0 Staff obtains Programmer, systems, Records, Test Results / Process Orders []  - 0 Staff telephones HHA, Nursing Homes / Clarify orders / etc []  - 0 Routine Transfer to another Facility (non-emergent condition) []  - 0 Routine Hospital Admission (non-emergent condition) []  - 0 New Admissions / Biomedical engineer / Ordering NPWT, Apligraf, etc. []  - 0 Emergency Hospital Admission (emergent condition) X- 1 10 Simple Discharge Coordination Garverick, Kiley E. ([id number]) []  - 0 Complex (extensive) Discharge Coordination PROCESS - Special Needs []  - Pediatric / Minor Patient Management 0 []  - 0 Isolation Patient Management []  - 0 Hearing / Language / Visual special needs []  - 0 Assessment of Community assistance (transportation, D/C planning, etc.) []  - 0 Additional assistance / Altered mentation []  - 0 Support Surface(s) Assessment (bed, cushion, seat,  etc.) INTERVENTIONS - Wound Cleansing / Measurement []  - Simple Wound Cleansing - one wound 0 X- 7 5 Complex Wound Cleansing - multiple wounds X- 1 5 Wound Imaging (photographs - any number of wounds) []  - 0 Wound Tracing (  instead of photographs) []  - 0 Simple Wound Measurement - one wound X- 7 5 Complex Wound Measurement - multiple wounds INTERVENTIONS - Wound Dressings X - Small Wound Dressing one or multiple wounds 7 10 []  - 0 Medium Wound Dressing one or multiple wounds []  - 0 Large Wound Dressing one or multiple wounds []  - 0 Application of Medications - topical []  - 0 Application of Medications - injection INTERVENTIONS - Miscellaneous []  - External ear exam 0 []  - 0 Specimen Collection (cultures, biopsies, blood, body fluids, etc.) []  - 0 Specimen(s) / Culture(s) sent or taken to Lab for analysis []  - 0 Patient Transfer (multiple staff / Civil Service fast streamer / Similar devices) []  - 0 Simple Staple / Suture removal (25 or less) []  - 0 Complex Staple / Suture removal (26 or more) []  - 0 Hypo / Hyperglycemic Management (close monitor of Blood Glucose) []  - 0 Ankle / Brachial Index (ABI) - do not check if billed separately X- 1 5 Vital Signs Ranney, Ryshawn E. ([id number]) Has the patient been seen at the hospital within the last three years: Yes Total Score: 230 Level Of Care: New/Established - Level 5 Electronic Signature(s) Signed: 02/22/2017 5:02:13 PM By: Montey Hora Entered By: Montey Hora on 02/21/2017 10:04:02 [name], [name] ([id number]) -------------------------------------------------------------------------------- Encounter Discharge Information Details Patient Name: [name], [name] Date of Service: 02/21/2017 8:45 AM Medical Record Number: [id number] Patient Account Number: [id number] Date of Birth/Sex: [birth year] (66 y.o. Male) Treating RN: Montey Hora Primary Care Gaylene Moylan: Myrtie Hawk Other Clinician: Referring Gentle Hoge: Myrtie Hawk Treating Cassey Hurrell/Extender: Frann Rider in Treatment: 21 Encounter Discharge Information Items Discharge Pain Level: 0 Discharge Condition: Stable Ambulatory Status: Ambulatory Discharge Destination: Home Transportation: Private Auto Accompanied By: brother Schedule Follow-up Appointment: Yes Medication Reconciliation completed and No provided to Patient/Care Ovadia Lopp: Provided on Clinical Summary of Care: 02/21/2017 Form Type Recipient Paper Patient ED Electronic Signature(s) Signed: 02/22/2017 5:02:13 PM By: Montey Hora Entered By: Montey Hora on 02/21/2017 10:04:58 Gwinner, [name] ([id number]) -------------------------------------------------------------------------------- Lower Extremity Assessment Details Patient Name: [name], [name] Date of Service: 02/21/2017 8:45 AM Medical Record Number: [id number] Patient Account Number: [id number] Date of Birth/Sex: [birth year] (66 y.o. Male) Treating RN: Montey Hora Primary Care Siearra Amberg: Myrtie Hawk Other Clinician: Referring Taurus Willis: Myrtie Hawk Treating Kahlyn Shippey/Extender: Frann Rider in Treatment: 21 Edema Assessment Assessed: [Left: No] [Right: No] Edema: [Left: No] [Right: No] Vascular Assessment Claudication: Claudication Assessment [Left:None] [Right:None] Pulses: Dorsalis Pedis Palpable: [Left:Yes] [Right:Yes] Posterior Tibial Extremity colors, hair growth, and conditions: Extremity Color: [Left:Normal] [Right:Normal] Hair Growth on Extremity: [Left:No] [Right:No] Temperature of Extremity: [Left:Warm] [Right:Warm] Capillary Refill: [Left:< 3 seconds] [Right:< 3 seconds] Electronic Signature(s) Signed: 02/22/2017 5:02:13 PM By: Montey Hora Entered By: Montey Hora on 02/21/2017 08:58:33 [name], [name] ([id number]) -------------------------------------------------------------------------------- Multi Wound Chart Details Patient Name: [name], [name] Date  of Service: 02/21/2017 8:45 AM Medical Record Number: [id number] Patient Account Number: [id number] Date of Birth/Sex: 1950/11/11 (66 y.o. Male) Treating RN: Montey Hora Primary Care Shunna Mikaelian: Myrtie Hawk Other Clinician: Referring Amali Uhls: Myrtie Hawk Treating Malik Paar/Extender: Frann Rider in Treatment: 21 Vital Signs Height(in): 69 Pulse(bpm): 95 Weight(lbs): 168 Blood Pressure(mmHg): 155/74 Body Mass Index(BMI): 25 Temperature(F): 98.0 Respiratory Rate 18 (breaths/min): Photos: [1:No Photos] [10:No Photos] [2:No Photos] Wound Location: [1:Right Toe Great] [10:Right Toe Fourth] [2:Right Toe Second] Wounding Event: [1:Gradually Appeared] [10:Not Known] [2:Gradually Appeared] Primary Etiology: [1:Diabetic Wound/Ulcer of the Lower Extremity] [10:Diabetic Wound/Ulcer of the Lower Extremity] [2:Diabetic Wound/Ulcer of the Lower Extremity] Comorbid History: [1:Anemia, Lymphedema,  Congestive Heart Failure, Hypertension, Peripheral Venous Disease, Type II Diabetes, Neuropathy] [10:Anemia, Lymphedema, Congestive Heart Failure, Hypertension, Peripheral Venous Disease, Type II Diabetes,  Neuropathy] [2:Anemia, Lymphedema, Congestive Heart Failure, Hypertension, Peripheral Venous Disease, Type II Diabetes, Neuropathy] Date Acquired: [1:06/11/2016] [10:01/28/2017] [2:06/11/2016] Weeks of Treatment: [1:21] [10:3] [2:21] Wound Status: [1:Open] [10:Open] [2:Open] Pending Amputation on [1:Yes] [10:No] [2:Yes] Presentation: Measurements L x W x D [1:1.5x1.6x0.1] [10:0.5x1.6x0.4] [2:0.6x0.7x0.1] (cm) Area (cm) : [1:1.885] [10:0.628] [2:0.33] Volume (cm) : [1:0.188] [10:0.251] [2:0.033] % Reduction in Area: [1:96.00%] [10:42.90%] [2:89.30%] % Reduction in Volume: [1:96.00%] [10:74.60%] [2:89.30%] Classification: [1:Grade 1] [10:Grade 2] [2:Grade 1] Exudate Amount: [1:Large] [10:Large] [2:Large] Exudate Type: [1:Serosanguineous] [10:Serous]  [2:Serosanguineous] Exudate Color: [1:red, brown] [10:amber] [2:red, brown] Foul Odor After Cleansing: [1:No] [10:No] [2:No] Odor Anticipated Due to [1:N/A] [10:N/A] [2:N/A] Product Use: Wound Margin: [1:Flat and Intact] [10:Flat and Intact] [2:Flat and Intact] Granulation Amount: [1:Medium (34-66%)] [10:Small (1-33%)] [2:Medium (34-66%)] Granulation Quality: [1:Pink] [10:Red] [2:Pink] Necrotic Amount: [1:Medium (34-66%)] [10:Large (67-100%)] [2:Medium (34-66%)] Necrotic Tissue: [1:Adherent Slough] [10:Eschar, Adherent Slough] [2:Adherent Slough] Exposed Structures: [1:Fat Layer (Subcutaneous Tissue) Exposed: Yes Fascia: No Tendon: No] [10:Fat Layer (Subcutaneous Tissue) Exposed: Yes Fascia: No Tendon: No] [2:Fat Layer (Subcutaneous Tissue) Exposed: Yes Fascia: No Tendon: No] Muscle: No Muscle: No Muscle: No Joint: No Joint: No Joint: No Bone: No Bone: No Bone: No Epithelialization: Small (1-33%) None Small (1-33%) Periwound Skin Texture: Excoriation: Yes Excoriation: No Excoriation: Yes Induration: No Induration: No Induration: No Callus: No Callus: No Callus: No Crepitus: No Crepitus: No Crepitus: No Rash: No Rash: No Rash: No Scarring: No Scarring: No Scarring: No Periwound Skin Moisture: Maceration: Yes Maceration: Yes Maceration: Yes Dry/Scaly: No Dry/Scaly: No Dry/Scaly: No Periwound Skin Color: Atrophie Blanche: No Atrophie Blanche: No Atrophie Blanche: No Cyanosis: No Cyanosis: No Cyanosis: No Ecchymosis: No Ecchymosis: No Ecchymosis: No Erythema: No Erythema: No Erythema: No Hemosiderin Staining: No Hemosiderin Staining: No Hemosiderin Staining: No Mottled: No Mottled: No Mottled: No Pallor: No Pallor: No Pallor: No Rubor: No Rubor: No Rubor: No Temperature: No Abnormality No Abnormality No Abnormality Tenderness on Palpation: No No No Wound Preparation: Ulcer Cleansing: Ulcer Cleansing: Ulcer Cleansing: Rinsed/Irrigated with  Saline Rinsed/Irrigated with Saline Rinsed/Irrigated with Saline Topical Anesthetic Applied: None Wound Number: [id number]  Photos: No Photos No Photos No Photos Wound Location: Left Toe Great Left Toe Second Left Toe Third Wounding Event: Gradually Appeared Gradually Appeared Gradually Appeared Primary Etiology: Diabetic Wound/Ulcer of 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 Lower Extremity Lower Extremity Comorbid History: Anemia, Lymphedema, Anemia, Lymphedema, Anemia, Lymphedema, Congestive Heart Failure, Congestive Heart Failure, Congestive Heart Failure, Hypertension, Peripheral Hypertension, Peripheral Hypertension, Peripheral Venous Disease, Type II Venous Disease, Type II Venous Disease, Type II Diabetes, Neuropathy Diabetes, Neuropathy Diabetes, Neuropathy Date Acquired: 06/11/2016 06/11/2016 12/30/2016 Weeks of Treatment: 21 21 7  Wound Status: Open Open Open Pending Amputation on Yes Yes No Presentation: Measurements L x W x D 0.4x0.2x0.1 0.7x0.7x0.1 0.1x0.1x0.1 (cm) Area (cm) : 0.063 0.385 0.008 Volume (cm) : 0.006 0.038 0.001 % Reduction in Area: 99.60% 82.00% 99.00% % Reduction in Volume: 99.60% 82.20% 98.80% Classification: Grade 1 Grade 1 Grade 1 Exudate Amount: Large Large Large Exudate Type: Serous Serous Serosanguineous Exudate Color: amber amber red, brown Foul Odor After Cleansing: Yes No No Odor Anticipated Due to No N/A N/A Product Use: Wound Margin: Flat and Intact Flat and Intact Distinct, outline attached Rueth, Menno E. ([id number]) Granulation Amount: Medium (34-66%) Medium (34-66%) Medium (34-66%) Granulation Quality: Pink Pink Red Necrotic  Amount: Medium (34-66%) Medium (34-66%) Medium (34-66%) Necrotic Tissue: Adherent [place] Exposed Structures: Fat Layer (Subcutaneous Fat Layer (Subcutaneous N/A Tissue) Exposed: Yes Tissue) Exposed: Yes Fascia: No Fascia: No Tendon: No Tendon:  No Muscle: No Muscle: No Joint: No Joint: No Bone: No Bone: No Epithelialization: Small (1-33%) Small (1-33%) None Periwound Skin Texture: Excoriation: Yes Excoriation: Yes No Abnormalities Noted Induration: No Induration: No Callus: No Callus: No Crepitus: No Crepitus: No Rash: No Rash: No Scarring: No Scarring: No Periwound Skin Moisture: Maceration: Yes Maceration: Yes Maceration: Yes Dry/Scaly: No Dry/Scaly: No Periwound Skin Color: Atrophie Blanche: No Atrophie Blanche: No No Abnormalities Noted Cyanosis: No Cyanosis: No Ecchymosis: No Ecchymosis: No Erythema: No Erythema: No Hemosiderin Staining: No Hemosiderin Staining: No Mottled: No Mottled: No Pallor: No Pallor: No Rubor: No Rubor: No Temperature: No Abnormality No Abnormality No Abnormality Tenderness on Palpation: No No Yes Wound Preparation: Ulcer Cleansing: Ulcer Cleansing: Ulcer Cleansing: Rinsed/Irrigated with Saline Rinsed/Irrigated with Saline Rinsed/Irrigated with Saline Wound Number: 9 N/A N/A Photos: No Photos N/A N/A Wound Location: Right Toe Third N/A N/A Wounding Event: Gradually Appeared N/A N/A Primary Etiology: Diabetic Wound/Ulcer of the N/A N/A Lower Extremity Comorbid History: Anemia, Lymphedema, N/A N/A Congestive Heart Failure, Hypertension, Peripheral Venous Disease, Type II Diabetes, Neuropathy Date Acquired: 12/31/2016 N/A N/A Weeks of Treatment: 7 N/A N/A Wound Status: Open N/A N/A Pending Amputation on No N/A N/A Presentation: Measurements L x W x D 0.4x0.3x0.1 N/A N/A (cm) Area (cm) : 0.094 N/A N/A Volume (cm) : 0.009 N/A N/A % Reduction in Area: 76.10% N/A N/A % Reduction in Volume: 76.90% N/A N/A Classification: Grade 1 N/A N/A Exudate Amount: Large N/A N/A Exudate Type: Serous N/A N/A Faxon, Lily E. ([id number]) Exudate Color: amber N/A N/A Foul Odor After Cleansing: Yes N/A N/A Odor Anticipated Due to No N/A N/A Product Use: Wound Margin: Flat  and Intact N/A N/A Granulation Amount: Medium (34-66%) N/A N/A Granulation Quality: Pink N/A N/A Necrotic Amount: Medium (34-66%) N/A N/A Necrotic Tissue: Adherent Slough N/A N/A Exposed Structures: Fascia: No N/A N/A Fat Layer (Subcutaneous Tissue) Exposed: No Tendon: No Muscle: No Joint: No Bone: No Epithelialization: Medium (34-66%) N/A N/A Periwound Skin Texture: Excoriation: No N/A N/A Induration: No Callus: No Crepitus: No Rash: No Scarring: No Periwound Skin Moisture: Maceration: Yes N/A N/A Dry/Scaly: No Periwound Skin Color: Atrophie Blanche: No N/A N/A Cyanosis: No Ecchymosis: No Erythema: No Hemosiderin Staining: No Mottled: No Pallor: No Rubor: No Temperature: No Abnormality N/A N/A Tenderness on Palpation: No N/A N/A Wound Preparation: Ulcer Cleansing: N/A N/A Rinsed/Irrigated with Saline Treatment Notes Electronic Signature(s) Signed: 02/21/2017 9:24:28 AM By: Christin Fudge MD, FACS Entered By: Christin Fudge on 02/21/2017 09:24:28 [name], [name] ([id number]) -------------------------------------------------------------------------------- [place] Details Patient Name: Gaza, Kaleo E. Date of Service: 02/21/2017 8:45 AM Medical Record Number: [id number] Patient Account Number: [id number] Date of Birth/Sex: 1951-03-04 (66 y.o. Male) Treating RN: Montey Hora Primary Care Fateh Kindle: Myrtie Hawk Other Clinician: Referring Alberto Schoch: Myrtie Hawk Treating Aryah Doering/Extender: Frann Rider in Treatment: 21 Active Inactive ` Abuse / Safety / Falls / Self Care Management Nursing Diagnoses: Potential for falls Goals: Patient will remain injury free related to falls Date Initiated: 09/21/2016 Target Resolution Date: 11/30/2016 Goal Status: Active Interventions: Assess fall risk on admission and as needed Notes: ` Nutrition Nursing Diagnoses: Potential for alteratiion in Nutrition/Potential for  imbalanced nutrition Goals: Patient/caregiver agrees to and verbalizes understanding of need to use nutritional supplements and/or vitamins as prescribed  Date Initiated: 09/21/2016 Target Resolution Date: 11/30/2016 Goal Status: Active Interventions: Assess patient nutrition upon admission and as needed per policy Notes: ` Orientation to the Wound Care Program Nursing Diagnoses: Knowledge deficit related to the wound healing center program Goals: Patient/caregiver will verbalize understanding of the [place] Date Initiated: 09/21/2016 Target Resolution Date: 11/30/2016 Goal Status: Active Interventions: DARKMitchelle, Goerner ([id number]) Provide education on orientation to the wound center Notes: ` Wound/Skin Impairment Nursing Diagnoses: Knowledge deficit related to smoking impact on wound healing Goals: Ulcer/skin breakdown will have a volume reduction of 30% by week 4 Date Initiated: 09/21/2016 Target Resolution Date: 11/30/2016 Goal Status: Active Ulcer/skin breakdown will have a volume reduction of 50% by week 8 Date Initiated: 09/21/2016 Target Resolution Date: 11/30/2016 Goal Status: Active Ulcer/skin breakdown will have a volume reduction of 80% by week 12 Date Initiated: 09/21/2016 Target Resolution Date: 11/30/2016 Goal Status: Active Ulcer/skin breakdown will heal within 14 weeks Date Initiated: 09/21/2016 Target Resolution Date: 11/30/2016 Goal Status: Active Interventions: Assess patient/caregiver ability to obtain necessary supplies Assess patient/caregiver ability to perform ulcer/skin care regimen upon admission and as needed Assess ulceration(s) every visit Notes: Electronic Signature(s) Signed: 02/22/2017 5:02:13 PM By: Montey Hora Entered By: Montey Hora on 02/21/2017 09:03:32 [name], [name] ([id number]) -------------------------------------------------------------------------------- Pain Assessment Details Patient Name: [name], [name]  E. Date of Service: 02/21/2017 8:45 AM Medical Record Number: [id number] Patient Account Number: [id number] Date of Birth/Sex: 03-Jun-1950 (66 y.o. Male) Treating RN: Montey Hora Primary Care Otillia Cordone: Myrtie Hawk Other Clinician: Referring Jamaya Sleeth: Myrtie Hawk Treating Jonanthony Nahar/Extender: Frann Rider in Treatment: 21 Active Problems Location of Pain Severity and Description of Pain Patient Has Paino No Site Locations Pain Management and Medication Current Pain Management: Electronic Signature(s) Signed: 02/22/2017 5:02:13 PM By: Montey Hora Entered By: Montey Hora on 02/21/2017 08:46:27 [name], [name] ([id number]) -------------------------------------------------------------------------------- Patient/Caregiver Education Details Patient Name: Mcdanel, Aceton E. Date of Service: 02/21/2017 8:45 AM Medical Record Number: [id number] Patient Account Number: [id number] Date of Birth/Gender: Dec 06, 1950 (66 y.o. Male) Treating RN: Montey Hora Primary Care Physician: Myrtie Hawk Other Clinician: Referring Physician: Myrtie Hawk Treating Physician/Extender: Frann Rider in Treatment: 21 Education Assessment Education Provided To: Patient and Caregiver Education Topics Provided Wound/Skin Impairment: Handouts: Other: wound care as ordered Methods: Demonstration, Explain/Verbal Responses: State content correctly Electronic Signature(s) Signed: 02/22/2017 5:02:13 PM By: Montey Hora Entered By: Montey Hora on 02/21/2017 10:05:13 [name], [name] ([id number]) -------------------------------------------------------------------------------- Wound Assessment Details Patient Name: Hillebrand, Lundon E. Date of Service: 02/21/2017 8:45 AM Medical Record Number: [id number] Patient Account Number: [id number] Date of Birth/Sex: 12/20/1950 (66 y.o. Male) Treating RN: Montey Hora Primary Care Lillyonna Armstead: Myrtie Hawk  Other Clinician: Referring Osa Campoli: Myrtie Hawk Treating Yevette Knust/Extender: Frann Rider in Treatment: 21 Wound Status Wound Number: 1 Primary Diabetic Wound/Ulcer of the Lower Extremity Etiology: Wound Location: Right Toe Great Wound Open Wounding Event: Gradually Appeared Status: Date Acquired: 06/11/2016 Comorbid Anemia, Lymphedema, Congestive Heart Weeks Of Treatment: 21 History: Failure, Hypertension, Peripheral Venous Clustered Wound: No Disease, Type II Diabetes, Neuropathy Pending Amputation On Presentation Photos Photo Uploaded By: Montey Hora on 02/22/2017 14:29:40 Wound Measurements Length: (cm) 1.5 Width: (cm) 1.6 Depth: (cm) 0.1 Area: (cm) 1.885 Volume: (cm) 0.188 % Reduction in Area: 96% % Reduction in Volume: 96% Epithelialization: Small (1-33%) Wound Description Classification: Grade 1 Wound Margin: Flat and Intact Exudate Amount: Large Exudate Type: Serosanguineous Exudate Color: red, brown Foul Odor After Cleansing: No Slough/Fibrino Yes Wound Bed Granulation Amount: Medium (34-66%) Exposed Structure Granulation Quality: Pink  Fascia Exposed: No Necrotic Amount: Medium (34-66%) Fat Layer (Subcutaneous Tissue) Exposed: Yes Necrotic Quality: Adherent Slough Tendon Exposed: No Muscle Exposed: No Joint Exposed: No Bone Exposed: No Periwound Skin Texture Dimascio, Markham E. ([id number]) Texture Color No Abnormalities Noted: No No Abnormalities Noted: No Callus: No Atrophie Blanche: No Crepitus: No Cyanosis: No Excoriation: Yes Ecchymosis: No Induration: No Erythema: No Rash: No Hemosiderin Staining: No Scarring: No Mottled: No Pallor: No Moisture Rubor: No No Abnormalities Noted: No Dry / Scaly: No Temperature / Pain Maceration: Yes Temperature: No Abnormality Wound Preparation Ulcer Cleansing: Rinsed/Irrigated with Saline Topical Anesthetic Applied: None Treatment Notes Wound #1 (Right Toe Great) 1. Cleansed  with: Clean wound with Normal Saline 4. Dressing Applied: Aquacel Ag 5. Secondary Dressing Applied Dry Gauze Kerlix/Conform Notes Nystatin cream, Sivercell, ABD, conform and stretch net Electronic Signature(s) Signed: 02/22/2017 5:02:13 PM By: Montey Hora Entered By: Montey Hora on 02/21/2017 08:59:10 [name], [name] ([id number]) -------------------------------------------------------------------------------- Wound Assessment Details Patient Name: Camerer, Dewain E. Date of Service: 02/21/2017 8:45 AM Medical Record Number: [id number] Patient Account Number: [id number] Date of Birth/Sex: February 14, 1951 (66 y.o. Male) Treating RN: Montey Hora Primary Care Lamir Racca: Myrtie Hawk Other Clinician: Referring Omya Winfield: Myrtie Hawk Treating Sokhna Christoph/Extender: Frann Rider in Treatment: 21 Wound Status Wound Number: 10 Primary Diabetic Wound/Ulcer of the Lower Extremity Etiology: Wound Location: Right Toe Fourth Wound Open Wounding Event: Not Known Status: Date Acquired: 01/28/2017 Comorbid Anemia, Lymphedema, Congestive Heart Weeks Of Treatment: 3 History: Failure, Hypertension, Peripheral Venous Clustered Wound: No Disease, Type II Diabetes, Neuropathy Photos Photo Uploaded By: Montey Hora on 02/22/2017 14:29:41 Wound Measurements Length: (cm) 0.5 Width: (cm) 1.6 Depth: (cm) 0.4 Area: (cm) 0.628 Volume: (cm) 0.251 % Reduction in Area: 42.9% % Reduction in Volume: 74.6% Epithelialization: None Wound Description Classification: Grade 2 Wound Margin: Flat and Intact Exudate Amount: Large Exudate Type: Serous Exudate Color: amber Foul Odor After Cleansing: No Slough/Fibrino Yes Wound Bed Granulation Amount: Small (1-33%) Exposed Structure Granulation Quality: Red Fascia Exposed: No Necrotic Amount: Large (67-100%) Fat Layer (Subcutaneous Tissue) Exposed: Yes Necrotic Quality: Eschar, Adherent Slough Tendon Exposed: No Muscle  Exposed: No Joint Exposed: No Bone Exposed: No Periwound Skin Texture Oman, [name] E. ([id number]) Texture Color No Abnormalities Noted: No No Abnormalities Noted: No Callus: No Atrophie Blanche: No Crepitus: No Cyanosis: No Excoriation: No Ecchymosis: No Induration: No Erythema: No Rash: No Hemosiderin Staining: No Scarring: No Mottled: No Pallor: No Moisture Rubor: No No Abnormalities Noted: No Dry / Scaly: No Temperature / Pain Maceration: Yes Temperature: No Abnormality Wound Preparation Ulcer Cleansing: Rinsed/Irrigated with Saline Treatment Notes Wound #10 (Right Toe Fourth) 1. Cleansed with: Clean wound with Normal Saline 4. Dressing Applied: Aquacel Ag 5. Secondary Dressing Applied Dry Gauze Kerlix/Conform Notes Nystatin cream, Sivercell, ABD, conform and stretch net Electronic Signature(s) Signed: 02/22/2017 5:02:13 PM By: Montey Hora Entered By: Montey Hora on 02/21/2017 08:59:35 Hilbert, [name] -------------------------------------------------------------------------------- Wound Assessment Details Patient Name: Bronaugh, Eliakim E. Date of Service: 02/21/2017 8:45 AM Medical Record Number: [id number] Patient Account Number: [id number] Date of Birth/Sex: 04-02-1951 (66 y.o. Male) Treating RN: Montey Hora Primary Care Liticia Gasior: Myrtie Hawk Other Clinician: Referring Alira Fretwell: Myrtie Hawk Treating Rahiem Schellinger/Extender: Frann Rider in Treatment: 21 Wound Status Wound Number: 2 Primary Diabetic Wound/Ulcer of the Lower Extremity Etiology: Wound Location: Right Toe Second Wound Open Wounding Event: Gradually Appeared Status: Date Acquired: 06/11/2016 Comorbid Anemia, Lymphedema, Congestive Heart Weeks Of Treatment: 21 History: Failure, Hypertension, Peripheral Venous Clustered Wound: No Disease, Type II Diabetes, Neuropathy  Pending Amputation On Presentation Photos Photo Uploaded By: Montey Hora on  02/22/2017 14:30:34 Wound Measurements Length: (cm) 0.6 Width: (cm) 0.7 Depth: (cm) 0.1 Area: (cm) 0.33 Volume: (cm) 0.033 % Reduction in Area: 89.3% % Reduction in Volume: 89.3% Epithelialization: Small (1-33%) Wound Description Classification: Grade 1 Wound Margin: Flat and Intact Exudate Amount: Large Exudate Type: Serosanguineous Exudate Color: red, brown Foul Odor After Cleansing: No Slough/Fibrino Yes Wound Bed Granulation Amount: Medium (34-66%) Exposed Structure Granulation Quality: Pink Fascia Exposed: No Necrotic Amount: Medium (34-66%) Fat Layer (Subcutaneous Tissue) Exposed: Yes Necrotic Quality: Adherent Slough Tendon Exposed: No Muscle Exposed: No Joint Exposed: No Bone Exposed: No Periwound Skin Texture Raybon, Johntae E. ([id number]) Texture Color No Abnormalities Noted: No No Abnormalities Noted: No Callus: No Atrophie Blanche: No Crepitus: No Cyanosis: No Excoriation: Yes Ecchymosis: No Induration: No Erythema: No Rash: No Hemosiderin Staining: No Scarring: No Mottled: No Pallor: No Moisture Rubor: No No Abnormalities Noted: No Dry / Scaly: No Temperature / Pain Maceration: Yes Temperature: No Abnormality Wound Preparation Ulcer Cleansing: Rinsed/Irrigated with Saline Treatment Notes Wound #2 (Right Toe Second) 1. Cleansed with: Clean wound with Normal Saline 4. Dressing Applied: Aquacel Ag 5. Secondary Dressing Applied Dry Gauze Kerlix/Conform Notes Nystatin cream, Sivercell, ABD, conform and stretch net Electronic Signature(s) Signed: 02/22/2017 5:02:13 PM By: Montey Hora Entered By: Montey Hora on 02/21/2017 09:00:25 Degrazia, [name] ([id number]) -------------------------------------------------------------------------------- Wound Assessment Details Patient Name: Delatorre, Kirtis E. Date of Service: 02/21/2017 8:45 AM Medical Record Number: [id number] Patient Account Number: [id number] Date of Birth/Sex: [birth year] (66  y.o. Male) Treating RN: Montey Hora Primary Care Lenyx Boody: Myrtie Hawk Other Clinician: Referring Quincey Nored: Myrtie Hawk Treating Ovella Manygoats/Extender: Frann Rider in Treatment: 21 Wound Status Wound Number: 5 Primary Diabetic Wound/Ulcer of the Lower Extremity Etiology: Wound Location: Left Toe Great Wound Open Wounding Event: Gradually Appeared Status: Date Acquired: 06/11/2016 Comorbid Anemia, Lymphedema, Congestive Heart Weeks Of Treatment: 21 History: Failure, Hypertension, Peripheral Venous Clustered Wound: No Disease, Type II Diabetes, Neuropathy Pending Amputation On Presentation Photos Photo Uploaded By: Montey Hora on 02/22/2017 14:30:34 Wound Measurements Length: (cm) 0.4 Width: (cm) 0.2 Depth: (cm) 0.1 Area: (cm) 0.063 Volume: (cm) 0.006 % Reduction in Area: 99.6% % Reduction in Volume: 99.6% Epithelialization: Small (1-33%) Wound Description Classification: Grade 1 Wound Margin: Flat and Intact Exudate Amount: Large Exudate Type: Serous Exudate Color: amber Foul Odor After Cleansing: Yes Due to Product Use: No Slough/Fibrino Yes Wound Bed Granulation Amount: Medium (34-66%) Exposed Structure Granulation Quality: Pink Fascia Exposed: No Necrotic Amount: Medium (34-66%) Fat Layer (Subcutaneous Tissue) Exposed: Yes Necrotic Quality: Adherent Slough Tendon Exposed: No Muscle Exposed: No Joint Exposed: No Bone Exposed: No Periwound Skin Texture Keating, Truett E. ([id number]) Texture Color No Abnormalities Noted: No No Abnormalities Noted: No Callus: No Atrophie Blanche: No Crepitus: No Cyanosis: No Excoriation: Yes Ecchymosis: No Induration: No Erythema: No Rash: No Hemosiderin Staining: No Scarring: No Mottled: No Pallor: No Moisture Rubor: No No Abnormalities Noted: No Dry / Scaly: No Temperature / Pain Maceration: Yes Temperature: No Abnormality Wound Preparation Ulcer Cleansing: Rinsed/Irrigated  with Saline Treatment Notes Wound #5 (Left Toe Great) 1. Cleansed with: Clean wound with Normal Saline 4. Dressing Applied: Aquacel Ag 5. Secondary Dressing Applied Dry Gauze Kerlix/Conform Notes Nystatin cream, Sivercell, ABD, conform and stretch net Electronic Signature(s) Signed: 02/22/2017 5:02:13 PM By: Montey Hora Entered By: Montey Hora on 02/21/2017 09:01:25 Pequignot, Jerusalem E. ([id number]) -------------------------------------------------------------------------------- Wound Assessment Details Patient Name: Babiarz, Bud E. Date of Service: 02/21/2017 8:45 AM Medical  Record Number: [id number] Patient Account Number: [id number] Date of Birth/Sex: Jan 29, 1951 (66 y.o. Male) Treating RN: Montey Hora Primary Care Zadie Deemer: Myrtie Hawk Other Clinician: Referring Jagjit Riner: Myrtie Hawk Treating Mariel Gaudin/Extender: Frann Rider in Treatment: 21 Wound Status Wound Number: 6 Primary Diabetic Wound/Ulcer of the Lower Extremity Etiology: Wound Location: Left Toe Second Wound Open Wounding Event: Gradually Appeared Status: Date Acquired: 06/11/2016 Comorbid Anemia, Lymphedema, Congestive Heart Weeks Of Treatment: 21 History: Failure, Hypertension, Peripheral Venous Clustered Wound: No Disease, Type II Diabetes, Neuropathy Pending Amputation On Presentation Photos Photo Uploaded By: Montey Hora on 02/22/2017 14:32:24 Wound Measurements Length: (cm) 0.7 Width: (cm) 0.7 Depth: (cm) 0.1 Area: (cm) 0.385 Volume: (cm) 0.038 % Reduction in Area: 82% % Reduction in Volume: 82.2% Epithelialization: Small (1-33%) Wound Description Classification: Grade 1 Wound Margin: Flat and Intact Exudate Amount: Large Exudate Type: Serous Exudate Color: amber Foul Odor After Cleansing: No Slough/Fibrino Yes Wound Bed Granulation Amount: Medium (34-66%) Exposed Structure Granulation Quality: Pink Fascia Exposed: No Necrotic Amount: Medium  (34-66%) Fat Layer (Subcutaneous Tissue) Exposed: Yes Necrotic Quality: Adherent Slough Tendon Exposed: No Muscle Exposed: No Joint Exposed: No Bone Exposed: No Periwound Skin Texture Dennington, Huston E. ([id number]) Texture Color No Abnormalities Noted: No No Abnormalities Noted: No Callus: No Atrophie Blanche: No Crepitus: No Cyanosis: No Excoriation: Yes Ecchymosis: No Induration: No Erythema: No Rash: No Hemosiderin Staining: No Scarring: No Mottled: No Pallor: No Moisture Rubor: No No Abnormalities Noted: No Dry / Scaly: No Temperature / Pain Maceration: Yes Temperature: No Abnormality Wound Preparation Ulcer Cleansing: Rinsed/Irrigated with Saline Treatment Notes Wound #6 (Left Toe Second) 1. Cleansed with: Clean wound with Normal Saline 4. Dressing Applied: Aquacel Ag 5. Secondary Dressing Applied Dry Gauze Kerlix/Conform Notes Nystatin cream, Sivercell, ABD, conform and stretch net Electronic Signature(s) Signed: 02/22/2017 5:02:13 PM By: Montey Hora Entered By: Montey Hora on 02/21/2017 09:01:50 Sanfilippo, [name] ([id number]) -------------------------------------------------------------------------------- Wound Assessment Details Patient Name: Castronova, Talvin E. Date of Service: 02/21/2017 8:45 AM Medical Record Number: [id number] Patient Account Number: [id number] Date of Birth/Sex: 12/29/1950 (66 y.o. Male) Treating RN: Montey Hora Primary Care Kathee Tumlin: Myrtie Hawk Other Clinician: Referring Gearline Spilman: Myrtie Hawk Treating Eila Runyan/Extender: Frann Rider in Treatment: 21 Wound Status Wound Number: 8 Primary Diabetic Wound/Ulcer of the Lower Extremity Etiology: Wound Location: Left Toe Third Wound Open Wounding Event: Gradually Appeared Status: Date Acquired: 12/30/2016 Comorbid Anemia, Lymphedema, Congestive Heart Weeks Of Treatment: 7 History: Failure, Hypertension, Peripheral Venous Clustered Wound:  No Disease, Type II Diabetes, Neuropathy Photos Photo Uploaded By: Montey Hora on 02/22/2017 14:32:24 Wound Measurements Length: (cm) 0.1 Width: (cm) 0.1 Depth: (cm) 0.1 Area: (cm) 0.008 Volume: (cm) 0.001 % Reduction in Area: 99% % Reduction in Volume: 98.8% Epithelialization: None Wound Description Classification: Grade 1 Wound Margin: Distinct, outline attached Exudate Amount: Large Exudate Type: Serosanguineous Exudate Color: red, brown Foul Odor After Cleansing: No Slough/Fibrino Yes Wound Bed Granulation Amount: Medium (34-66%) Granulation Quality: Red Necrotic Amount: Medium (34-66%) Necrotic Quality: Adherent Slough Periwound Skin Texture Texture Color No Abnormalities Noted: No No Abnormalities Noted: No Moisture Temperature / Pain Bjorn, Bron E. ([id number]) No Abnormalities Noted: No Temperature: No Abnormality Maceration: Yes Tenderness on Palpation: Yes Wound Preparation Ulcer Cleansing: Rinsed/Irrigated with Saline Treatment Notes Wound #8 (Left Toe Third) 1. Cleansed with: Clean wound with Normal Saline 4. Dressing Applied: Aquacel Ag 5. Secondary Dressing Applied Dry Gauze Kerlix/Conform Notes Nystatin cream, Sivercell, ABD, conform and stretch net Electronic Signature(s) Signed: 02/22/2017 5:02:13 PM By: Montey Hora Entered By: Montey Hora  on 02/21/2017 09:02:53 Nienow, Kele E. ([id number]) -------------------------------------------------------------------------------- Wound Assessment Details Patient Name: Gaultney, KHAIDYN STAEBELL. Date of Service: 02/21/2017 8:45 AM Medical Record Number: [id number] Patient Account Number: [id number] Date of Birth/Sex: October 07, 1950 (66 y.o. Male) Treating RN: Montey Hora Primary Care Hussain Maimone: Myrtie Hawk Other Clinician: Referring Micha Dosanjh: Myrtie Hawk Treating Deshante Cassell/Extender: Frann Rider in Treatment: 21 Wound Status Wound Number: 9 Primary Diabetic Wound/Ulcer of  the Lower Extremity Etiology: Wound Location: Right Toe Third Wound Open Wounding Event: Gradually Appeared Status: Date Acquired: 12/31/2016 Comorbid Anemia, Lymphedema, Congestive Heart Weeks Of Treatment: 7 History: Failure, Hypertension, Peripheral Venous Clustered Wound: No Disease, Type II Diabetes, Neuropathy Photos Photo Uploaded By: Montey Hora on 02/22/2017 14:32:59 Wound Measurements Length: (cm) 0.4 Width: (cm) 0.3 Depth: (cm) 0.1 Area: (cm) 0.094 Volume: (cm) 0.009 % Reduction in Area: 76.1% % Reduction in Volume: 76.9% Epithelialization: Medium (34-66%) Wound Description Classification: Grade 1 Wound Margin: Flat and Intact Exudate Amount: Large Exudate Type: Serous Exudate Color: amber Foul Odor After Cleansing: Yes Due to Product Use: No Slough/Fibrino Yes Wound Bed Granulation Amount: Medium (34-66%) Exposed Structure Granulation Quality: Pink Fascia Exposed: No Necrotic Amount: Medium (34-66%) Fat Layer (Subcutaneous Tissue) Exposed: No Necrotic Quality: Adherent Slough Tendon Exposed: No Muscle Exposed: No Joint Exposed: No Bone Exposed: No Periwound Skin Texture Lansberry, Adonnis E. ([id number]) Texture Color No Abnormalities Noted: No No Abnormalities Noted: No Callus: No Atrophie Blanche: No Crepitus: No Cyanosis: No Excoriation: No Ecchymosis: No Induration: No Erythema: No Rash: No Hemosiderin Staining: No Scarring: No Mottled: No Pallor: No Moisture Rubor: No No Abnormalities Noted: No Dry / Scaly: No Temperature / Pain Maceration: Yes Temperature: No Abnormality Wound Preparation Ulcer Cleansing: Rinsed/Irrigated with Saline Treatment Notes Wound #9 (Right Toe Third) 1. Cleansed with: Clean wound with Normal Saline 4. Dressing Applied: Aquacel Ag 5. Secondary Dressing Applied Dry Gauze Kerlix/Conform Notes Nystatin cream, Sivercell, ABD, conform and stretch net Electronic Signature(s) Signed: 02/22/2017 5:02:13  PM By: Montey Hora Entered By: Montey Hora on 02/21/2017 09:03:12 Helmer, [name] ([id number]) -------------------------------------------------------------------------------- Vitals Details Patient Name: Casalino, Early E. Date of Service: 02/21/2017 8:45 AM Medical Record Number: [id number] Patient Account Number: [id number] Date of Birth/Sex: 03/23/1951 (66 y.o. Male) Treating RN: Montey Hora Primary Care Bernardo Brayman: Myrtie Hawk Other Clinician: Referring Price Lachapelle: Myrtie Hawk Treating Azam Gervasi/Extender: Frann Rider in Treatment: 21 Vital Signs Time Taken: 08:47 Temperature (F): 98.0 Height (in): 69 Pulse (bpm): 68 Weight (lbs): 168 Respiratory Rate (breaths/min): 18 Body Mass Index (BMI): 24.8 Blood Pressure (mmHg): 155/74 Reference Range: 80 - 120 mg / dl Electronic Signature(s) Signed: 02/22/2017 5:02:13 PM By: Montey Hora Entered By: Montey Hora on 02/21/2017 08:48:39

## 2017-02-26 ENCOUNTER — Other Ambulatory Visit: Payer: Self-pay | Admitting: Oncology

## 2017-02-26 ENCOUNTER — Inpatient Hospital Stay: Payer: Medicare HMO

## 2017-02-26 ENCOUNTER — Other Ambulatory Visit: Payer: Self-pay | Admitting: *Deleted

## 2017-02-26 DIAGNOSIS — D649 Anemia, unspecified: Secondary | ICD-10-CM

## 2017-02-26 DIAGNOSIS — Z862 Personal history of diseases of the blood and blood-forming organs and certain disorders involving the immune mechanism: Secondary | ICD-10-CM

## 2017-02-26 DIAGNOSIS — I130 Hypertensive heart and chronic kidney disease with heart failure and stage 1 through stage 4 chronic kidney disease, or unspecified chronic kidney disease: Secondary | ICD-10-CM | POA: Diagnosis not present

## 2017-02-26 DIAGNOSIS — N189 Chronic kidney disease, unspecified: Secondary | ICD-10-CM

## 2017-02-26 DIAGNOSIS — D638 Anemia in other chronic diseases classified elsewhere: Secondary | ICD-10-CM

## 2017-02-26 LAB — HEMOGLOBIN: Hemoglobin: 6.6 g/dL — ABNORMAL LOW (ref 13.0–18.0)

## 2017-02-26 LAB — PREPARE RBC (CROSSMATCH)

## 2017-02-26 NOTE — Progress Notes (Unsigned)
2 units of prbc tomorrow. Procrit next week weekly.

## 2017-02-27 ENCOUNTER — Encounter
Admission: RE | Admit: 2017-02-27 | Discharge: 2017-02-27 | Disposition: A | Discharge status: Home / Self Care | Payer: Medicare HMO | Source: Ambulatory Visit | Attending: Oncology | Admitting: Oncology

## 2017-02-27 DIAGNOSIS — D649 Anemia, unspecified: Secondary | ICD-10-CM

## 2017-02-27 DIAGNOSIS — E11621 Type 2 diabetes mellitus with foot ulcer: Secondary | ICD-10-CM | POA: Diagnosis not present

## 2017-02-27 LAB — PREPARE RBC (CROSSMATCH)

## 2017-02-27 MED ORDER — SODIUM CHLORIDE 0.9% FLUSH: 3.0000 mL | INTRAVENOUS | Status: DC | PRN

## 2017-02-27 MED ORDER — METHYLPREDNISOLONE SODIUM SUCC 125 MG IJ SOLR: 40.0000 mg | Freq: Once | INTRAMUSCULAR | Status: DC

## 2017-02-27 MED ORDER — HEPARIN SOD (PORK) LOCK FLUSH 100 UNIT/ML IV SOLN: 500.0000 [IU] | Freq: Every day | INTRAVENOUS | Status: DC | PRN

## 2017-02-27 MED ORDER — ACETAMINOPHEN 325 MG PO TABS
ORAL_TABLET | ORAL | Status: AC
  Administered 2017-02-27: 650 mg via ORAL
  Filled 2017-02-27: qty 2

## 2017-02-27 MED ORDER — SODIUM CHLORIDE 0.9% FLUSH: 10.0000 mL | INTRAVENOUS | Status: DC | PRN

## 2017-02-27 MED ORDER — HEPARIN SOD (PORK) LOCK FLUSH 100 UNIT/ML IV SOLN: 250.0000 [IU] | INTRAVENOUS | Status: DC | PRN

## 2017-02-27 MED ORDER — SODIUM CHLORIDE 0.9 % IV SOLN
250.0000 mL | Freq: Once | INTRAVENOUS | Status: AC
  Administered 2017-02-27: 500 mL via INTRAVENOUS

## 2017-02-27 MED ORDER — ACETAMINOPHEN 325 MG PO TABS
650.0000 mg | ORAL_TABLET | Freq: Once | ORAL | Status: AC
  Administered 2017-02-27: 650 mg via ORAL

## 2017-02-27 NOTE — OR Nursing (Signed)
Spoke with Dr Janese Banks via phone to clarify orders.

## 2017-02-28 LAB — TYPE AND SCREEN
ABO/RH(D): A POS
Antibody Screen: NEGATIVE
UNIT DIVISION: 0
UNIT DIVISION: 0

## 2017-02-28 LAB — BPAM RBC
Blood Product Expiration Date: 201811302359
Blood Product Expiration Date: 201812132359
ISSUE DATE / TIME: 201811210858
ISSUE DATE / TIME: 201811211133
UNIT TYPE AND RH: 6200
UNIT TYPE AND RH: 6200

## 2017-03-05 ENCOUNTER — Inpatient Hospital Stay: Payer: Medicare HMO

## 2017-03-05 ENCOUNTER — Other Ambulatory Visit: Payer: Self-pay

## 2017-03-05 VITALS — BP 172/87

## 2017-03-05 DIAGNOSIS — D649 Anemia, unspecified: Secondary | ICD-10-CM

## 2017-03-05 DIAGNOSIS — D638 Anemia in other chronic diseases classified elsewhere: Secondary | ICD-10-CM

## 2017-03-05 DIAGNOSIS — N189 Chronic kidney disease, unspecified: Secondary | ICD-10-CM

## 2017-03-05 DIAGNOSIS — D509 Iron deficiency anemia, unspecified: Secondary | ICD-10-CM

## 2017-03-05 DIAGNOSIS — Z862 Personal history of diseases of the blood and blood-forming organs and certain disorders involving the immune mechanism: Secondary | ICD-10-CM

## 2017-03-05 DIAGNOSIS — I130 Hypertensive heart and chronic kidney disease with heart failure and stage 1 through stage 4 chronic kidney disease, or unspecified chronic kidney disease: Secondary | ICD-10-CM | POA: Diagnosis not present

## 2017-03-05 LAB — HEMOGLOBIN: HEMOGLOBIN: 8.8 g/dL — AB (ref 13.0–18.0)

## 2017-03-05 MED ORDER — EPOETIN ALFA 10000 UNIT/ML IJ SOLN
10000.0000 [IU] | INTRAMUSCULAR | Status: DC
  Administered 2017-03-05: 10000 [IU] via SUBCUTANEOUS

## 2017-03-05 NOTE — Progress Notes (Signed)
Per nurse per MD, ok to proceed with treatment despite BP elevated.

## 2017-03-05 NOTE — Progress Notes (Signed)
Patient's blood pressure is elevated at 173/87 (1st attempt), 172/87 (2nd attempt), 172/87 (3rd attempt).  Informed Dr. Janese Banks about bp readings and she would like patient to receive procrit injection today.  Will inform patient's PCP of his elevated BP readings in our office today.   Primary care provider:  [facility]  Dr. Lamonte Sakai P: [phone] F: [phone]

## 2017-03-06 ENCOUNTER — Telehealth: Payer: Self-pay | Admitting: Oncology

## 2017-03-06 NOTE — Telephone Encounter (Signed)
Lab/MD/Procrit Inj appst rschd, per Provider on PAL.  Updated appt schd mailed to patient. Appt conf with patient. MF

## 2017-03-07 ENCOUNTER — Encounter: Payer: Medicare HMO | Admitting: Surgery

## 2017-03-07 DIAGNOSIS — E11621 Type 2 diabetes mellitus with foot ulcer: Secondary | ICD-10-CM | POA: Diagnosis not present

## 2017-03-10 NOTE — Progress Notes (Signed)
[name], [name] ([id number]) Visit Report for 03/07/2017 Chief Complaint Document Details Patient Name: [name], [name] Date of Service: 03/07/2017 11:00 AM Medical Record Number: [id number] Patient Account Number: [id number] Date of Birth/Sex: 1951/01/06 (66 y.o. Male) Treating RN: Montey Hora Primary Care Provider: Myrtie Hawk Other Clinician: Referring Provider: Myrtie Hawk Treating Provider/Extender: Frann Rider in Treatment: 23 Information Obtained from: Patient Chief Complaint Patients presents for treatment of an open diabetic ulcer to both feet Electronic Signature(s) Signed: 03/07/2017 11:31:39 AM By: Christin Fudge MD, FACS Entered By: Christin Fudge on 03/07/2017 11:31:38 Curington, [name] ([id number]) -------------------------------------------------------------------------------- HPI Details Patient Name: [name], [name] Date of Service: 03/07/2017 11:00 AM Medical Record Number: [id number] Patient Account Number: [id number] Date of Birth/Sex: 04-24-1950 (66 y.o. Male) Treating RN: Montey Hora Primary Care Provider: Myrtie Hawk Other Clinician: Referring Provider: Myrtie Hawk Treating Provider/Extender: Frann Rider in Treatment: 23 History of Present Illness Location: bilateral feet ulceration on the toes Quality: Patient reports experiencing a dull pain to affected area(s). Severity: Patient states wound are getting better Duration: Patient has had the wound for > 3 months prior to seeking treatment at the wound center Timing: Pain in wound is constant (hurts all the time) Context: The wound would happen gradually Modifying Factors: Other treatment(s) tried include:treatment for lymphedema and is seen by the podiatrist Dr. Caryl Comes Associated Signs and Symptoms: Patient reports having increase swelling. HPI Description: 66 year old patient here to see as for bilateral feet ulceration to on his left first and  second toe and 2 on his right first and second toe, which she's had for about 4 months. 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 Donella Stade. The patient is also undergoing workup by medical oncology for a intra-abdominal lymphadenopathy. 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 He is also status post appendectomy, inguinal lymph node biopsy, rectal biopsy and rectal examination under anesthesia. he currently smokes cigarettes about half packet a day. In March of this year he was seen by Dr. Hortencia Pilar, for evaluation of bilateral varicose veins and besides wearing compression stockings he had recommended laser ablation of the right and left great saphenous veins to eleviate the symptoms and complications of severe superficial venous reflux disease. He also recommended lymphedema pumps for better control of his lymphedema. The patient recently has had on 08/23/2016, right greater saphenous vein ablation with the laser energy Earlier lower extremity venous reflux examination done on 05/08/2016 showed no DVT or SVT both lower legs but incompetence of bilateral great saphenous veins was present. A lower arterial study was also done and there was no significant right lower and left lower extremity problems based on a normal toe brachial index bilaterally and the ABI was 1.21 the left and 1.23 on the right. His post ablation venous duplex examination showed successful ablation of the right GS vein with thrombus formation 2 below the right saphenofemoral junction. The deep system was patent without evidence of thrombosis and this was done on 08/30/2016. the patient also has a squamous cell cancer of the skin of the buttock and is recently undergone  radiation therapy for this prior to excisional surgery. Addendum: regarding his x-rays done today and x-ray of the left foot -- IMPRESSION: No objective evidence of osteomyelitis. There are soft tissue  changes which may reflect cellulitis. X-ray of the right foot -- IMPRESSION:Findings compatible with cellulitis of the toes. No objective evidence of osteomyelitis is observed. 10/01/16 on evaluation today patient's wounds appeared to be doing some better. I did review the x-rays as well which showed no evidence of osteomyelitis although there was evidence on x-ray of cellulitis. He fortunately is not having any discomfort although he continues to have some swelling. He does not remember being on any antibiotics recently. 10/15/16 on evaluation today patient's wounds overall appear to be doing better although he does have a new location noted on the left foot. Fortunately he is not having significant pain. It almost has the appearance that something is rubbing on the end of his toes but he wears the open toe shoes and according to what he is telling me never wears anything that would rub on his foot. There is no evidence of infection and specifically no evidence of a fungal infection 10/22/16 On evaluation today patient's wounds appeared to be doing better compared to last week in regard to his bilateral [name], Jalal E. ([id number]) lower extremities. Fortunately I happy with how things are progressing although he still has ulcers I feel like that he is improving and appropriate manner. 11/12/16 on evaluation today patient appears to be doing well in regard to his bilateral feet and the respective wounds. We have been using surrounding her dressings along with an antifungal cream which seems to be doing very well. He has no bilateral dysfunction noticed that the rituals are weight loss at this point. He also has no nausea or vomiting a note purulent discharge. He did see Vein and vascular today and he  tells me that they told him he could have surgery for his venous stasis but they did not feel like it was worth it in his words. Fortunately patient's wounds do appear to be getting sneakily better. 11/26/2016 -- he says he is going to have some surgery during this week at [facility] for possibly a colon resection. 12/31/2016 -- the patient has been noncompliant with his smoking and I'm not sure whether he is also started drinking again. He continues to be very nonchalant about his care 01/14/2017 -- the patient's HandP has been reviewed well and I understand he is being compliant with trying to give up smoking and his local dressing changes. He does not have any surgical options of 4 to him by his vascular surgeons.he was last seen in early August by Dr. Hortencia Pilar who recommended compression stockings,and possibly lymph pumps in 2-3 months after doing a review ultrasound. 01/28/2017 - the patient did not have any fresh complaints but on examination I noted a large lacerated wound on the plantar aspect of his right fourth toe which had a lot of necrotic debris and it probes down to bone. 02/07/2017 -- x-ray of the right foot -- IMPRESSION: Soft tissue swelling about the first through fourth toes consistent with cellulitis. New destructive change in the tuft of the distal phalanx of the great toe is consistent with osteomyelitis. 02/14/2017 -- the patient's MRI is pending this coming Monday and he still continues to smoke. We have again gone over off loading of his wounds in great detail and he says he's been compliant. 02/21/2017 -- MR of the right foot -- IMPRESSION: 1. Soft tissue ulcer at the tip of the first, second and third toe knows. Cortical irregularity and bone marrow edema in the first distal phalanx most concerning for osteomyelitis. Mild  marrow edema in the second and third distal phalanx without definite cortical destruction which may reflect early osteomyelitis versus  reactive marrow edema. 2. Soft tissue edema surrounding the first phalanx most consistent with cellulitis. the patient was also recently evaluated by his medical oncologist Dr. 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 She is treating him with weekly Procrit. She is also keeping intra-abdominal lymphadenopathy and right lower lobe lung nodule under observation. Electronic Signature(s) Signed: 03/07/2017 11:31:46 AM By: Christin Fudge MD, FACS Entered By: Christin Fudge on 03/07/2017 11:31:46 Simmon, [name] ([id number]) -------------------------------------------------------------------------------- Physical Exam Details Patient Name: [name], [name] Date of Service: 03/07/2017 11:00 AM Medical Record Number: [id number] Patient Account Number: [id number] Date of Birth/Sex: 08-27-1950 (66 y.o. Male) Treating RN: Montey Hora Primary Care Provider: Myrtie Hawk Other Clinician: Referring Provider: Myrtie Hawk Treating Provider/Extender: Frann Rider in Treatment: 23 Constitutional . Pulse regular. Respirations normal and unlabored. Afebrile. . Eyes Nonicteric. Reactive to light. Ears, Nose, Mouth, and Throat Lips, teeth, and gums WNL.Marland Kitchen Moist mucosa without lesions. Neck supple and nontender. No palpable supraclavicular or cervical adenopathy. Normal sized without goiter. Respiratory WNL. No retractions.. Cardiovascular Pedal Pulses WNL. No clubbing, cyanosis or edema. Lymphatic No adneopathy. No adenopathy. No adenopathy. Musculoskeletal Adexa without tenderness or enlargement.. Digits and nails w/o clubbing, cyanosis, infection, petechiae, ischemia, or inflammatory conditions.. Integumentary (Hair, Skin) No suspicious lesions. No crepitus or fluctuance. No peri-wound warmth or erythema. No masses.Marland Kitchen Psychiatric Judgement and insight Intact.. No evidence of depression, anxiety, or  agitation.. Notes the wound on the right fourth toe plantar aspect is almost completely healed. The rest of his wounds are looking very clean and we will continue local care with silver alginate. Lymphedema as very significant and this is because he has not been compliant wearing his compression stockings Electronic Signature(s) Signed: 03/07/2017 11:32:40 AM By: Christin Fudge MD, FACS Entered By: Christin Fudge on 03/07/2017 11:32:40 Zeller, [name] ([id number]) -------------------------------------------------------------------------------- Physician Orders Details Patient Name: Minetti, Mcihael E. Date of Service: 03/07/2017 11:00 AM Medical Record Number: [id number] Patient Account Number: [id number] Date of Birth/Sex: [birth year] (66 y.o. Male) Treating RN: Montey Hora Primary Care Provider: Myrtie Hawk Other Clinician: Referring Provider: Myrtie Hawk Treating Provider/Extender: Frann Rider in Treatment: 67 Verbal / Phone Orders: No Diagnosis Coding Wound Cleansing Wound #1 Right Toe Great o Clean wound with Normal Saline. o May Shower, gently pat wound dry prior to applying new dressing. Wound #10 Right Toe Fourth o Clean wound with Normal Saline. o May Shower, gently pat wound dry prior to applying new dressing. Wound #2 Right Toe Second o Clean wound with Normal Saline. o May Shower, gently pat wound dry prior to applying new dressing. Wound #5 Left Toe Great o Clean wound with Normal Saline. o May Shower, gently pat wound dry prior to applying new dressing. Wound #6 Left Toe Second o Clean wound with Normal Saline. o May Shower, gently pat wound dry prior to applying new dressing. Wound #8 Left Toe Third o Clean wound with Normal Saline. o May Shower, gently pat wound dry prior to applying new dressing. Wound #9 Right Toe Third o Clean wound with Normal Saline. o May Shower, gently pat wound dry prior to applying  new dressing. Anesthetic Wound #1 Right Toe Great o Topical Lidocaine 4% cream applied to wound bed prior to debridement Wound #10 Right Toe Fourth o Topical Lidocaine 4% cream applied to wound bed prior  to debridement Wound #2 Right Toe Second o Topical Lidocaine 4% cream applied to wound bed prior to debridement Wound #5 Left Toe Great o Topical Lidocaine 4% cream applied to wound bed prior to debridement Wound #6 Left Toe Second o Topical Lidocaine 4% cream applied to wound bed prior to debridement Brook, Ammaar E. ([id number]) Wound #8 Left Toe Third o Topical Lidocaine 4% cream applied to wound bed prior to debridement Wound #9 Right Toe Third o Topical Lidocaine 4% cream applied to wound bed prior to debridement Primary Wound Dressing Wound #1 Right Toe Great o Silvercel Non-Adherent o Other: - nystatin Wound #10 Right Toe Fourth o Silvercel Non-Adherent o Other: - nystatin Wound #2 Right Toe Second o Silvercel Non-Adherent o Other: - nystatin Wound #5 Left Toe Great o Silvercel Non-Adherent o Other: - nystatin Wound #6 Left Toe Second o Silvercel Non-Adherent o Other: - nystatin Wound #8 Left Toe Third o Silvercel Non-Adherent o Other: - nystatin Wound #9 Right Toe Third o Silvercel Non-Adherent o Other: - nystatin Secondary Dressing Wound #1 Right Toe Great o Gauze and Kerlix/Conform Wound #10 Right Toe Fourth o Gauze and Kerlix/Conform Wound #2 Right Toe Second o Gauze and Kerlix/Conform Wound #5 Left Toe Great o Gauze and Kerlix/Conform Wound #6 Left Toe Second o Gauze and Kerlix/Conform Wound #8 Left Toe Third o Gauze and Kerlix/Conform Wound #9 Right Toe Third o Gauze and Kerlix/Conform Borchardt, Zander E. ([id number]) Dressing Change Frequency Wound #1 Right Toe Great o Change dressing every day. Wound #10 Right Toe Fourth o Change dressing every day. Wound #2 Right Toe Second o Change  dressing every day. Wound #5 Left Toe Great o Change dressing every day. Wound #6 Left Toe Second o Change dressing every day. Wound #8 Left Toe Third o Change dressing every day. Wound #9 Right Toe Third o Change dressing every day. Follow-up Appointments Wound #1 Right Toe Great o Return Appointment in 1 week. Wound #10 Right Toe Fourth o Return Appointment in 1 week. Wound #2 Right Toe Second o Return Appointment in 1 week. Wound #5 Left Toe Great o Return Appointment in 1 week. Wound #6 Left Toe Second o Return Appointment in 1 week. Wound #8 Left Toe Third o Return Appointment in 1 week. Wound #9 Right Toe Third o Return Appointment in 1 week. Additional Orders / Instructions Wound #1 Right Toe Great o Stop Smoking o Increase protein intake. o Other: - Please add vitamin A, vitamin C and zinc supplements to your diet Wound #10 Right Toe Fourth o Stop Smoking o Increase protein intake. o Other: - Please add vitamin A, vitamin C and zinc supplements to your diet Brannock, Rod E. ([id number]) Wound #2 Right Toe Second o Stop Smoking o Increase protein intake. o Other: - Please add vitamin A, vitamin C and zinc supplements to your diet Wound #5 Left Toe Great o Stop Smoking o Increase protein intake. o Other: - Please add vitamin A, vitamin C and zinc supplements to your diet Wound #6 Left Toe Second o Stop Smoking o Increase protein intake. o Other: - Please add vitamin A, vitamin C and zinc supplements to your diet Wound #8 Left Toe Third o Stop Smoking o Increase protein intake. o Other: - Please add vitamin A, vitamin C and zinc supplements to your diet Wound #9 Right Toe Third o Stop Smoking o Increase protein intake. o Other: - Please add vitamin A, vitamin C and zinc supplements to your diet Electronic Signature(s) Signed:  03/07/2017 4:36:05 PM By: Christin Fudge MD, FACS Signed: 03/08/2017  4:46:33 PM By: Montey Hora Entered By: Montey Hora on 03/07/2017 11:26:20 Nepomuceno, [name] ([id number]) -------------------------------------------------------------------------------- Problem List Details Patient Name: [name], [name] Date of Service: 03/07/2017 11:00 AM Medical Record Number: [id number] Patient Account Number: [id number] Date of Birth/Sex: 1950-08-23 (66 y.o. Male) Treating RN: Montey Hora Primary Care Provider: Myrtie Hawk Other Clinician: Referring Provider: Myrtie Hawk Treating Provider/Extender: Frann Rider in Treatment: 23 Active Problems ICD-10 Encounter Code Description Active Date Diagnosis E11.621 Type 2 diabetes mellitus with foot ulcer 09/21/2016 Yes I87.313 Chronic venous hypertension (idiopathic) with ulcer of bilateral 09/21/2016 Yes lower extremity I89.0 Lymphedema, not elsewhere classified 09/21/2016 Yes L97.522 Non-pressure chronic ulcer of other part of left foot with fat layer 09/21/2016 Yes exposed L97.512 Non-pressure chronic ulcer of other part of right foot with fat layer 09/21/2016 Yes exposed F17.218 Nicotine dependence, cigarettes, with other nicotine-induced 09/21/2016 Yes disorders F10.19 Alcohol abuse with unspecified alcohol-induced disorder 09/21/2016 Yes M86.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, right ankle and foot 02/21/2017 Yes Inactive Problems Resolved Problems Electronic Signature(s) Signed: 03/07/2017 11:31:24 AM By: Christin Fudge MD, FACS Entered By: Christin Fudge on 03/07/2017 11:31:24 Sweeten, Britney EMarland Kitchen ([id number]) -------------------------------------------------------------------------------- Progress Note Details Patient Name: [name], [name] Date of Service: 03/07/2017 11:00 AM Medical Record Number: [id number] Patient Account Number: [id number] Date of Birth/Sex: 1950/11/04 (66 y.o. Male) Treating RN: Montey Hora Primary Care Provider: Myrtie Hawk Other  Clinician: Referring Provider: Myrtie Hawk Treating Provider/Extender: Frann Rider in Treatment: 23 Subjective Chief Complaint Information obtained from Patient Patients presents for treatment of an open diabetic ulcer to both feet History of Present Illness (HPI) The following HPI elements were documented for the patient's wound: Location: bilateral feet ulceration on the toes Quality: Patient reports experiencing a dull pain to affected area(s). Severity: Patient states wound are getting better Duration: Patient has had the wound for > 3 months prior to seeking treatment at the wound center Timing: Pain in wound is constant (hurts all the time) Context: The wound would happen gradually Modifying Factors: Other treatment(s) tried include:treatment for lymphedema and is seen by the podiatrist Dr. Caryl Comes Associated Signs and Symptoms: Patient reports having increase swelling. 66 year old patient here to see as for bilateral feet ulceration to on his left first and second toe and 2 on his right first and second toe, which she's had for about 4 months. 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 Donella Stade. The patient is also undergoing workup by medical oncology for a intra-abdominal lymphadenopathy. 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 He is also status post appendectomy, inguinal lymph node biopsy, rectal biopsy and rectal examination under anesthesia. he currently smokes cigarettes about half packet a day. In March of this year he was seen by Dr. Hortencia Pilar, for evaluation of bilateral varicose veins and besides wearing compression stockings he had recommended laser ablation of the right and left great saphenous veins  to eleviate the symptoms and complications of severe superficial venous reflux disease. He also recommended lymphedema pumps for better control of his lymphedema. The patient recently has had on 08/23/2016, right greater saphenous vein ablation with the laser energy Earlier lower extremity venous reflux examination done on 05/08/2016 showed no DVT or SVT both lower legs but  incompetence of bilateral great saphenous veins was present. A lower arterial study was also done and there was no significant right lower and left lower extremity problems based on a normal toe brachial index bilaterally and the ABI was 1.21 the left and 1.23 on the right. His post ablation venous duplex examination showed successful ablation of the right GS vein with thrombus formation 2 below the right saphenofemoral junction. The deep system was patent without evidence of thrombosis and this was done on 08/30/2016. 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 Addendum: regarding his x-rays done today and x-ray of the left foot -- IMPRESSION: No objective evidence of osteomyelitis. There are soft tissue changes which may reflect cellulitis. X-ray of the right foot -- IMPRESSION:Findings compatible with cellulitis of the toes. No objective evidence of osteomyelitis is observed. 10/01/16 on evaluation today patient's wounds appeared to be doing some better. I did review the x-rays as well which showed no evidence of osteomyelitis although there was evidence on x-ray of cellulitis. He fortunately is not having any discomfort Petsch, Glover E. ([id number]) although he continues to have some swelling. He does not remember being on any antibiotics recently. 10/15/16 on evaluation today patient's wounds overall appear to be doing better although he does have a new location noted on the left foot. Fortunately he is not having  significant pain. It almost has the appearance that something is rubbing on the end of his toes but he wears the open toe shoes and according to what he is telling me never wears anything that would rub on his foot. There is no evidence of infection and specifically no evidence of a fungal infection 10/22/16 On evaluation today patient's wounds appeared to be doing better compared to last week in regard to his bilateral lower extremities. Fortunately I happy with how things are progressing although he still has ulcers I feel like that he is improving and appropriate manner. 11/12/16 on evaluation today patient appears to be doing well in regard to his bilateral feet and the respective wounds. We have been using surrounding her dressings along with an antifungal cream which seems to be doing very well. He has no bilateral dysfunction noticed that the rituals are weight loss at this point. He also has no nausea or vomiting a note purulent discharge. He did see Vein and vascular today and he tells me that they told him he could have surgery for his venous stasis but they did not feel like it was worth it in his words. Fortunately patient's wounds do appear to be getting sneakily better. 11/26/2016 -- he says he is going to have some surgery during this week at [facility] for possibly a colon resection. 12/31/2016 -- the patient has been noncompliant with his smoking and I'm not sure whether he is also started drinking again. He continues to be very nonchalant about his care 01/14/2017 -- the patient's HandP has been reviewed well and I understand he is being compliant with trying to give up smoking and his local dressing changes. He does not have any surgical options of 4 to him by his vascular surgeons.he was last seen in early August by Dr. Hortencia Pilar who recommended compression stockings,and possibly lymph pumps in 2-3 months after doing a review ultrasound. 01/28/2017 - the patient did not  have any fresh complaints but on examination I noted a large lacerated wound on  the plantar aspect of his right fourth toe which had a lot of necrotic debris and it probes down to bone. 02/07/2017 -- x-ray of the right foot -- IMPRESSION: Soft tissue swelling about the first through fourth toes consistent with cellulitis. New destructive change in the tuft of the distal phalanx of the great toe is consistent with osteomyelitis. 02/14/2017 -- the patient's MRI is pending this coming Monday and he still continues to smoke. We have again gone over off loading of his wounds in great detail and he says he's been compliant. 02/21/2017 -- MR of the right foot -- IMPRESSION: 1. Soft tissue ulcer at the tip of the first, second and third toe knows. Cortical irregularity and bone marrow edema in the first distal phalanx most concerning for osteomyelitis. Mild marrow edema in the second and third distal phalanx without definite cortical destruction which may reflect early osteomyelitis versus reactive marrow edema. 2. Soft tissue edema surrounding the first phalanx most consistent with cellulitis. the patient was also recently evaluated by his medical oncologist Dr. 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 She is treating him with weekly Procrit. She is also keeping intra-abdominal lymphadenopathy and right lower lobe lung nodule under observation. Patient History Information obtained from Patient. Social History Current every day smoker, Marital Status - Widowed, Alcohol Use - Daily - quit drinking about a week ago, Drug Use - No History, Caffeine Use - Moderate. 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 ([id number]) Objective Constitutional Pulse regular. Respirations normal and unlabored. Afebrile. Vitals Time Taken: 11:10 AM, Height: 69 in,  Weight: 168 lbs, BMI: 24.8, Temperature: 98.3 F, Pulse: 67 bpm, Respiratory Rate: 18 breaths/min, Blood Pressure: 166/79 mmHg. Eyes Nonicteric. Reactive to light. Ears, Nose, Mouth, and Throat Lips, teeth, and gums WNL.Marland Kitchen Moist mucosa without lesions. Neck supple and nontender. No palpable supraclavicular or cervical adenopathy. Normal sized without goiter. Respiratory WNL. No retractions.. Cardiovascular Pedal Pulses WNL. No clubbing, cyanosis or edema. Lymphatic No adneopathy. No adenopathy. No adenopathy. Musculoskeletal Adexa without tenderness or enlargement.. Digits and nails w/o clubbing, cyanosis, infection, petechiae, ischemia, or inflammatory conditions.Marland Kitchen Psychiatric Judgement and insight Intact.. No evidence of depression, anxiety, or agitation.. General Notes: the wound on the right fourth toe plantar aspect is almost completely healed. The rest of his wounds are looking very clean and we will continue local care with silver alginate. Lymphedema as very significant and this is because he has not been compliant wearing his compression stockings Integumentary (Hair, Skin) No suspicious lesions. No crepitus or fluctuance. No peri-wound warmth or erythema. No masses.. Wound #1 status is Open. Original cause of wound was Gradually Appeared. The wound is located on the Right Toe Great. The wound measures 1.6cm length x 1.8cm width x 0.1cm depth; 2.262cm^2 area and 0.226cm^3 volume. There is Fat Layer (Subcutaneous Tissue) Exposed exposed. There is no tunneling or undermining noted. There is a large amount of serosanguineous drainage noted. The wound margin is flat and intact. There is medium (34-66%) pink granulation within the wound bed. There is a medium (34-66%) amount of necrotic tissue within the wound bed including Adherent Slough. The periwound skin appearance exhibited: Excoriation, Maceration. The periwound skin appearance did not exhibit: Callus, Crepitus, Induration,  Rash, Scarring, Dry/Scaly, Atrophie Blanche, Cyanosis, Ecchymosis, Hemosiderin Staining, Mottled, Pallor, Rubor, Erythema. Periwound temperature was noted as No Abnormality. Wound #10 status is Open.  Original cause of wound was Not Known. The wound is located on the Right Toe Fourth. The Steffy, Tremain E. ([id number]) wound measures 0.1cm length x 0.1cm width x 0.1cm depth; 0.008cm^2 area and 0.001cm^3 volume. There is Fat Layer (Subcutaneous Tissue) Exposed exposed. There is no tunneling or undermining noted. There is a large amount of serous drainage noted. The wound margin is flat and intact. There is small (1-33%) red granulation within the wound bed. There is a large (67-100%) amount of necrotic tissue within the wound bed including Eschar and Adherent Slough. The periwound skin appearance exhibited: Maceration. The periwound skin appearance did not exhibit: Callus, Crepitus, Excoriation, Induration, Rash, Scarring, Dry/Scaly, Atrophie Blanche, Cyanosis, Ecchymosis, Hemosiderin Staining, Mottled, Pallor, Rubor, Erythema. Periwound temperature was noted as No Abnormality. Wound #2 status is Open. Original cause of wound was Gradually Appeared. The wound is located on the Right Toe Second. The wound measures 1cm length x 1.8cm width x 0.1cm depth; 1.414cm^2 area and 0.141cm^3 volume. There is Fat Layer (Subcutaneous Tissue) Exposed exposed. There is no tunneling or undermining noted. There is a large amount of serosanguineous drainage noted. The wound margin is flat and intact. There is medium (34-66%) pink granulation within the wound bed. There is a medium (34-66%) amount of necrotic tissue within the wound bed including Adherent Slough. The periwound skin appearance exhibited: Excoriation, Maceration. The periwound skin appearance did not exhibit: Callus, Crepitus, Induration, Rash, Scarring, Dry/Scaly, Atrophie Blanche, Cyanosis, Ecchymosis, Hemosiderin Staining, Mottled, Pallor, Rubor,  Erythema. Periwound temperature was noted as No Abnormality. Wound #5 status is Open. Original cause of wound was Gradually Appeared. The wound is located on the Left Toe Great. The wound measures 0.8cm length x 0.3cm width x 0.1cm depth; 0.188cm^2 area and 0.019cm^3 volume. There is Fat Layer (Subcutaneous Tissue) Exposed exposed. There is no tunneling or undermining noted. There is a large amount of serous drainage noted. Foul odor after cleansing was noted. The wound margin is flat and intact. There is medium (34-66%) pink granulation within the wound bed. There is a medium (34-66%) amount of necrotic tissue within the wound bed including Adherent Slough. The periwound skin appearance exhibited: Excoriation, Maceration. The periwound skin appearance did not exhibit: Callus, Crepitus, Induration, Rash, Scarring, Dry/Scaly, Atrophie Blanche, Cyanosis, Ecchymosis, Hemosiderin Staining, Mottled, Pallor, Rubor, Erythema. Periwound temperature was noted as No Abnormality. Wound #6 status is Open. Original cause of wound was Gradually Appeared. The wound is located on the Left Toe Second. The wound measures 2cm length x 1.4cm width x 0.1cm depth; 2.199cm^2 area and 0.22cm^3 volume. There is Fat Layer (Subcutaneous Tissue) Exposed exposed. There is no tunneling or undermining noted. There is a large amount of serous drainage noted. The wound margin is flat and intact. There is medium (34-66%) pink granulation within the wound bed. There is a medium (34-66%) amount of necrotic tissue within the wound bed including Adherent Slough. The periwound skin appearance exhibited: Excoriation, Maceration. The periwound skin appearance did not exhibit: Callus, Crepitus, Induration, Rash, Scarring, Dry/Scaly, Atrophie Blanche, Cyanosis, Ecchymosis, Hemosiderin Staining, Mottled, Pallor, Rubor, Erythema. Periwound temperature was noted as No Abnormality. Wound #8 status is Open. Original cause of wound was Gradually  Appeared. The wound is located on the Left Toe Third. The wound measures 0.1cm length x 0.1cm width x 0.1cm depth; 0.008cm^2 area and 0.001cm^3 volume. There is no tunneling or undermining noted. There is a large amount of serosanguineous drainage noted. The wound margin is distinct with the outline attached to the wound base. There  is medium (34-66%) red granulation within the wound bed. There is a medium (34- 66%) amount of necrotic tissue within the wound bed including Adherent Slough. The periwound skin appearance exhibited: Maceration. Periwound temperature was noted as No Abnormality. The periwound has tenderness on palpation. Wound #9 status is Open. Original cause of wound was Gradually Appeared. The wound is located on the Right Toe Third. The wound measures 0.7cm length x 0.6cm width x 0.1cm depth; 0.33cm^2 area and 0.033cm^3 volume. There is no tunneling or undermining noted. There is a large amount of serous drainage noted. Foul odor after cleansing was noted. The wound margin is flat and intact. There is medium (34-66%) pink granulation within the wound bed. There is a medium (34- 66%) amount of necrotic tissue within the wound bed including Adherent Slough. The periwound skin appearance exhibited: Maceration. The periwound skin appearance did not exhibit: Callus, Crepitus, Excoriation, Induration, Rash, Scarring, Dry/Scaly, Atrophie Blanche, Cyanosis, Ecchymosis, Hemosiderin Staining, Mottled, Pallor, Rubor, Erythema. Periwound temperature was noted as No Abnormality. Assessment Active Problems ICD-10 DEWAIN, PLATZ ([id number]) E11.621 - Type 2 diabetes mellitus with foot ulcer I87.313 - Chronic venous hypertension (idiopathic) with ulcer of bilateral lower extremity I89.0 - Lymphedema, not elsewhere classified L97.522 - Non-pressure chronic ulcer of other part of left foot with fat layer exposed L97.512 - Non-pressure chronic ulcer of other part of right foot with fat layer  exposed F17.218 - Nicotine dependence, cigarettes, with other nicotine-induced disorders F10.19 - Alcohol abuse 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 - Chronic multifocal osteomyelitis, right ankle and foot Plan Wound Cleansing: Wound #1 Right Toe Great: Clean wound with Normal Saline. May Shower, gently pat wound dry prior to applying new dressing. Wound #10 Right Toe Fourth: Clean wound with Normal Saline. May Shower, gently pat wound dry prior to applying new dressing. Wound #2 Right Toe Second: Clean wound with Normal Saline. May Shower, gently pat wound dry prior to applying new dressing. Wound #5 Left Toe Great: Clean wound with Normal Saline. May Shower, gently pat wound dry prior to applying new dressing. Wound #6 Left Toe Second: Clean wound with Normal Saline. May Shower, gently pat wound dry prior to applying new dressing. Wound #8 Left Toe Third: Clean wound with Normal Saline. May Shower, gently pat wound dry prior to applying new dressing. Wound #9 Right Toe Third: Clean wound with Normal Saline. May Shower, gently pat wound dry prior to applying new dressing. Anesthetic: Wound #1 Right Toe Great: Topical Lidocaine 4% cream applied to wound bed prior to debridement Wound #10 Right Toe Fourth: Topical Lidocaine 4% cream applied to wound bed prior to debridement Wound #2 Right Toe Second: Topical Lidocaine 4% cream applied to wound bed prior to debridement Wound #5 Left Toe Great: Topical Lidocaine 4% cream applied to wound bed prior to debridement Wound #6 Left Toe Second: Topical Lidocaine 4% cream applied to wound bed prior to debridement Wound #8 Left Toe Third: Topical Lidocaine 4% cream applied to wound bed prior to debridement Wound #9 Right Toe Third: Topical Lidocaine 4% cream applied to wound bed prior to debridement Primary Wound Dressing: Wound #1 Right Toe Great: Silvercel Non-Adherent Other: - nystatin Wound #10 Right Toe  Fourth: Silvercel Non-Adherent Mettler, Marckus E. ([id number]) Other: - nystatin Wound #2 Right Toe Second: Silvercel Non-Adherent Other: - nystatin Wound #5 Left Toe Great: Silvercel Non-Adherent Other: - nystatin Wound #6 Left Toe Second: Silvercel Non-Adherent Other: - nystatin Wound #8 Left Toe Third: Silvercel Non-Adherent Other: - nystatin Wound #9  Right Toe Third: Silvercel Non-Adherent Other: - nystatin Secondary Dressing: Wound #1 Right Toe Great: Gauze and Kerlix/Conform Wound #10 Right Toe Fourth: Gauze and Kerlix/Conform Wound #2 Right Toe Second: Gauze and Kerlix/Conform Wound #5 Left Toe Great: Gauze and Kerlix/Conform Wound #6 Left Toe Second: Gauze and Kerlix/Conform Wound #8 Left Toe Third: Gauze and Kerlix/Conform Wound #9 Right Toe Third: Gauze and Kerlix/Conform Dressing Change Frequency: Wound #1 Right Toe Great: Change dressing every day. Wound #10 Right Toe Fourth: Change dressing every day. Wound #2 Right Toe Second: Change dressing every day. Wound #5 Left Toe Great: Change dressing every day. Wound #6 Left Toe Second: Change dressing every day. Wound #8 Left Toe Third: Change dressing every day. Wound #9 Right Toe Third: Change dressing every day. Follow-up Appointments: Wound #1 Right Toe Great: Return Appointment in 1 week. Wound #10 Right Toe Fourth: Return Appointment in 1 week. Wound #2 Right Toe Second: Return Appointment in 1 week. Wound #5 Left Toe Great: Return Appointment in 1 week. Wound #6 Left Toe Second: Return Appointment in 1 week. Wound #8 Left Toe Third: Return Appointment in 1 week. Wound #9 Right Toe Third: EYOEL, THROGMORTON. ([id number]) Return Appointment in 1 week. Additional Orders / Instructions: Wound #1 Right Toe Great: Stop Smoking Increase protein intake. Other: - Please add vitamin A, vitamin C and zinc supplements to your diet Wound #10 Right Toe Fourth: Stop Smoking Increase protein  intake. Other: - Please add vitamin A, vitamin C and zinc supplements to your diet Wound #2 Right Toe Second: Stop Smoking Increase protein intake. Other: - Please add vitamin A, vitamin C and zinc supplements to your diet Wound #5 Left Toe Great: Stop Smoking Increase protein intake. Other: - Please add vitamin A, vitamin C and zinc supplements to your diet Wound #6 Left Toe Second: Stop Smoking Increase protein intake. Other: - Please add vitamin A, vitamin C and zinc supplements to your diet Wound #8 Left Toe Third: Stop Smoking Increase protein intake. Other: - Please add vitamin A, vitamin C and zinc supplements to your diet Wound #9 Right Toe Third: Stop Smoking Increase protein intake. Other: - Please add vitamin A, vitamin C and zinc supplements to your diet with his recent diagnosis of chronic osteomyelitis, we are awaiting Dr. Blane Ohara opinion which is on December 10. In the meanwhile, I have recommended: 1. packing this with Silver alginate and an appropriate foam off loading. 2. I have encouraged him to wear his compression stockings previously advised 3. adequate protein, vitamin A, vitamin C and zinc 4. Completely give up smoking Electronic Signature(s) Signed: 03/07/2017 11:34:13 AM By: Christin Fudge MD, FACS Entered By: Christin Fudge on 03/07/2017 11:34:13 [place], [name] ([id number]) -------------------------------------------------------------------------------- ROS/PFSH Details Patient Name: Alford, Malik E. Date of Service: 03/07/2017 11:00 AM Medical Record Number: [id number] Patient Account Number: [id number] Date of Birth/Sex: 03-Nov-1950 (66 y.o. Male) Treating RN: Montey Hora Primary Care Provider: Myrtie Hawk Other Clinician: Referring Provider: Myrtie Hawk Treating Provider/Extender: Frann Rider in Treatment: 23 Information Obtained From Patient Wound History Do you currently have one or more open woundso  Yes How many open wounds do you currently haveo 6 Approximately how long have you had your woundso 3 months How have you been treating your wound(s) until nowo ointment and bandage Has your wound(s) ever healed and then re-openedo No Have you had any lab work done in the past montho No Have you tested positive for an antibiotic resistant organism (MRSA, VRE)o No Have you  tested positive for osteomyelitis (bone infection)o No Have you had any tests for circulation on your legso Yes Who ordered the testo PCP Where was the test doneo [facility] Medical History: Negative for: Cataracts; Glaucoma; Optic Neuritis Ear/Nose/Mouth/Throat Medical History: Negative for: Chronic sinus problems/congestion; Middle ear problems Hematologic/Lymphatic Medical History: Positive for: Anemia; Lymphedema Negative for: Hemophilia; Human Immunodeficiency Virus; Sickle Cell Disease Respiratory Medical History: Negative for: Aspiration; Asthma; Chronic Obstructive Pulmonary Disease (COPD); Pneumothorax; Sleep Apnea; Tuberculosis Cardiovascular Medical History: Positive for: Congestive Heart Failure; Hypertension; Peripheral Venous Disease Negative for: Angina; Arrhythmia; Coronary Artery Disease; Deep Vein Thrombosis; Hypotension; Myocardial Infarction; Peripheral Arterial Disease; Phlebitis; Vasculitis Gastrointestinal Medical History: Negative for: Cirrhosis ; Colitis; Crohnos; Hepatitis A; Hepatitis B; Hepatitis C Quakenbush, Ngoc E. ([id number]) Endocrine Medical History: Positive for: Type II Diabetes Treated with: Oral agents Blood sugar tested every day: Yes Tested : QD Genitourinary Medical History: Negative for: End Stage Renal Disease Immunological Medical History: Negative for: Lupus Erythematosus; Raynaudos; Scleroderma Integumentary (Skin) Medical History: Negative for: History of Burn; History of pressure wounds Musculoskeletal Medical History: Negative for: Gout; Rheumatoid  Arthritis; Osteoarthritis; Osteomyelitis Neurologic Medical History: Positive for: Neuropathy Negative for: Dementia; Quadriplegia; Paraplegia; Seizure Disorder Oncologic Medical History: Past Medical History Notes: 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 Electronic Signature(s) Signed: 03/07/2017 4:36:05 PM By: Christin Fudge MD, FACS Signed: 03/08/2017 4:46:33 PM By: Deniece Ree, [name] ([id number]) Entered By: Christin Fudge on 03/07/2017 11:31:54 [place]ower, [name] ([id number]) -------------------------------------------------------------------------------- SuperBill Details Patient Name: [name], [name] Date of Service: 03/07/2017 Medical Record Number: [id number] Patient Account Number: [id number] Date of Birth/Sex: [birth year] (66 y.o. Male) Treating RN: Montey Hora Primary Care Provider: Myrtie Hawk Other Clinician: Referring Provider: Myrtie Hawk Treating Provider/Extender: Frann Rider in Treatment: 23 Diagnosis Coding ICD-10 Codes Code Description E11.621 Type 2 diabetes mellitus with foot ulcer I87.313 Chronic venous hypertension (idiopathic) with ulcer of bilateral lower extremity I89.0 Lymphedema, not elsewhere classified L97.522 Non-pressure chronic ulcer of other part of left foot with fat layer exposed L97.512 Non-pressure chronic  ulcer of other part of right foot with fat layer exposed F17.218 Nicotine dependence, cigarettes, 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 abuse 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, right ankle and foot Facility Procedures CPT4 Code: [id number] Description: 75643 - WOUND CARE VISIT-LEV 5 EST PT Modifier: Quantity: 1 Physician Procedures CPT4 Code Description: [id number] - WC PHYS LEVEL 3 - EST PT ICD-10 Diagnosis Description E11.621 Type 2 diabetes mellitus with foot ulcer I87.313 Chronic venous hypertension (idiopathic) with ulcer of bilateral M86.371 Chronic multifocal  osteomyelitis, right ankle and foot L97.522 Non-pressure chronic ulcer of other part of left foot with fat l Modifier: lower extremi ayer exposed Quantity: 1 ty Electronic Signature(s) Signed: 03/07/2017 2:50:56 PM By: Montey Hora Signed: 03/07/2017 4:36:05 PM By: Christin Fudge MD, FACS Previous Signature: 03/07/2017 11:34:32 AM Version By: Christin Fudge MD, FACS Entered By: Montey Hora on 03/07/2017 14:50:56

## 2017-03-10 NOTE — Progress Notes (Signed)
[name], [name] ([id number]) Visit Report for 03/07/2017 Arrival Information Details Patient Name: [name], [name] Date of Service: 03/07/2017 11:00 AM Medical Record Number: [id number] Patient Account Number: [id number] Date of Birth/Sex: Sep 09, 1950 (66 y.o. Male) Treating RN: Montey Hora Primary Care Tyshika Baldridge: Myrtie Hawk Other Clinician: Referring Kharee Lesesne: Myrtie Hawk Treating Amillion Macchia/Extender: Frann Rider in Treatment: 23 Visit Information History Since Last Visit Added or deleted any medications: No Patient Arrived: Ambulatory Any new allergies or adverse reactions: No Arrival Time: 11:08 Had a fall or experienced change in No Accompanied By: brother activities of daily living that may affect Transfer Assistance: None risk of falls: Patient Identification Verified: Yes Signs or symptoms of abuse/neglect since last visito No Secondary Verification Process Completed: Yes Hospitalized since last visit: No Patient Requires Transmission-Based No Has Dressing in Place as Prescribed: Yes Precautions: Pain Present Now: No Patient Has Alerts: Yes Patient Alerts: DMII Electronic Signature(s) Signed: 03/08/2017 4:46:33 PM By: Montey Hora Entered By: Montey Hora on 03/07/2017 11:09:57 Kemp, [name] ([id number]) -------------------------------------------------------------------------------- Clinic Level of Care Assessment Details Patient Name: [name], [name] Date of Service: 03/07/2017 11:00 AM Medical Record Number: [id number] Patient Account Number: [id number] Date of Birth/Sex: [birth year] (66 y.o. Male) Treating RN: Montey Hora Primary Care Noretta Frier: Myrtie Hawk Other Clinician: Referring Dandria Griego: Myrtie Hawk Treating Cadey Bazile/Extender: Frann Rider in Treatment: 23 Clinic Level of Care Assessment Items TOOL 4 Quantity Score []  - Use when only an EandM is performed on FOLLOW-UP visit 0 ASSESSMENTS -  Nursing Assessment / Reassessment X - Reassessment of Co-morbidities (includes updates in patient status) 1 10 X- 1 5 Reassessment of Adherence to Treatment Plan ASSESSMENTS - Wound and Skin Assessment / Reassessment []  - Simple Wound Assessment / Reassessment - one wound 0 X- 7 5 Complex Wound Assessment / Reassessment - multiple wounds []  - 0 Dermatologic / Skin Assessment (not related to wound area) ASSESSMENTS - Focused Assessment []  - Circumferential Edema Measurements - multi extremities 0 []  - 0 Nutritional Assessment / Counseling / Intervention X- 1 5 Lower Extremity Assessment (monofilament, tuning fork, pulses) []  - 0 Peripheral Arterial Disease Assessment (using hand held doppler) ASSESSMENTS - Ostomy and/or Continence Assessment and Care []  - Incontinence Assessment and Management 0 []  - 0 Ostomy Care Assessment and Management (repouching, etc.) PROCESS - Coordination of Care X - Simple Patient / Family Education for ongoing care 1 15 []  - 0 Complex (extensive) Patient / Family Education for ongoing care []  - 0 Staff obtains Programmer, systems, Records, Test Results / Process Orders []  - 0 Staff telephones HHA, Nursing Homes / Clarify orders / etc []  - 0 Routine Transfer to another Facility (non-emergent condition) []  - 0 Routine Hospital Admission (non-emergent condition) []  - 0 New Admissions / Biomedical engineer / Ordering NPWT, Apligraf, etc. []  - 0 Emergency Hospital Admission (emergent condition) X- 1 10 Simple Discharge Coordination Whitfill, Jessi E. ([id number]) []  - 0 Complex (extensive) Discharge Coordination PROCESS - Special Needs []  - Pediatric / Minor Patient Management 0 []  - 0 Isolation Patient Management []  - 0 Hearing / Language / Visual special needs []  - 0 Assessment of Community assistance (transportation, D/C planning, etc.) []  - 0 Additional assistance / Altered mentation []  - 0 Support Surface(s) Assessment (bed, cushion, seat,  etc.) INTERVENTIONS - Wound Cleansing / Measurement []  - Simple Wound Cleansing - one wound 0 X- 7 5 Complex Wound Cleansing - multiple wounds X- 1 5 Wound Imaging (photographs - any number of wounds) []  - 0 Wound Tracing (  instead of photographs) []  - 0 Simple Wound Measurement - one wound X- 7 5 Complex Wound Measurement - multiple wounds INTERVENTIONS - Wound Dressings X - Small Wound Dressing one or multiple wounds 7 10 []  - 0 Medium Wound Dressing one or multiple wounds []  - 0 Large Wound Dressing one or multiple wounds []  - 0 Application of Medications - topical []  - 0 Application of Medications - injection INTERVENTIONS - Miscellaneous []  - External ear exam 0 []  - 0 Specimen Collection (cultures, biopsies, blood, body fluids, etc.) []  - 0 Specimen(s) / Culture(s) sent or taken to Lab for analysis []  - 0 Patient Transfer (multiple staff / Civil Service fast streamer / Similar devices) []  - 0 Simple Staple / Suture removal (25 or less) []  - 0 Complex Staple / Suture removal (26 or more) []  - 0 Hypo / Hyperglycemic Management (close monitor of Blood Glucose) []  - 0 Ankle / Brachial Index (ABI) - do not check if billed separately X- 1 5 Vital Signs Shiplett, Nuh E. ([id number]) Has the patient been seen at the hospital within the last three years: Yes Total Score: 230 Level Of Care: New/Established - Level 5 Electronic Signature(s) Signed: 03/08/2017 4:46:33 PM By: Montey Hora Entered By: Montey Hora on 03/07/2017 14:50:47 Traeger, [name] ([id number]) -------------------------------------------------------------------------------- Encounter Discharge Information Details Patient Name: [name], [name] Date of Service: 03/07/2017 11:00 AM Medical Record Number: [id number] Patient Account Number: [id number] Date of Birth/Sex: [birth year] (66 y.o. Male) Treating RN: Montey Hora Primary Care Yasmen Cortner: Myrtie Hawk Other Clinician: Referring Aslan Montagna: Myrtie Hawk Treating Godson Pollan/Extender: Frann Rider in Treatment: 63 Encounter Discharge Information Items Discharge Pain Level: 0 Discharge Condition: Stable Ambulatory Status: Ambulatory Discharge Destination: Home Private Transportation: Auto Accompanied By: self Schedule Follow-up Appointment: Yes Medication Reconciliation completed and provided No to Patient/Care Jerone Cudmore: Clinical Summary of Care: Electronic Signature(s) Signed: 03/07/2017 2:52:36 PM By: Montey Hora Entered By: Montey Hora on 03/07/2017 14:52:35 Drahos, [name] ([id number]) -------------------------------------------------------------------------------- Lower Extremity Assessment Details Patient Name: Stretch, Wyat E. Date of Service: 03/07/2017 11:00 AM Medical Record Number: [id number] Patient Account Number: [id number] Date of Birth/Sex: March 18, 1951 (66 y.o. Male) Treating RN: Montey Hora Primary Care Quaran Kedzierski: Myrtie Hawk Other Clinician: Referring Bekki Tavenner: Myrtie Hawk Treating Madaleine Simmon/Extender: Frann Rider in Treatment: 23 Vascular Assessment Pulses: Dorsalis Pedis Palpable: [Left:Yes] [Right:Yes] Posterior Tibial Extremity colors, hair growth, and conditions: Extremity Color: [Left:Hyperpigmented] [Right:Hyperpigmented] Hair Growth on Extremity: [Left:No] [Right:No] Temperature of Extremity: [Left:Warm] [Right:Warm] Capillary Refill: [Left:< 3 seconds] [Right:< 3 seconds] Electronic Signature(s) Signed: 03/08/2017 4:46:33 PM By: Montey Hora Entered By: Montey Hora on 03/07/2017 11:18:12 Harman, Krue E. ([id number]) -------------------------------------------------------------------------------- Multi Wound Chart Details Patient Name: Macphail, Saeed E. Date of Service: 03/07/2017 11:00 AM Medical Record Number: [id number] Patient Account Number: [id number] Date of Birth/Sex: [birth year] (66 y.o. Male) Treating RN: Montey Hora Primary Care  Rishikesh Khachatryan: Myrtie Hawk Other Clinician: Referring Talen Poser: Myrtie Hawk Treating Darol Cush/Extender: Frann Rider in Treatment: 23 Vital Signs Height(in): 76 Pulse(bpm): 22 Weight(lbs): 168 Blood Pressure(mmHg): 166/79 Body Mass Index(BMI): 25 Temperature(F): 98.3 Respiratory Rate 18 (breaths/min): Photos: [1:No Photos] [10:No Photos] [2:No Photos] Wound Location: [1:Right Toe Great] [10:Right Toe Fourth] [2:Right Toe Second] Wounding Event: [1:Gradually Appeared] [10:Not Known] [2:Gradually Appeared] Primary Etiology: [1:Diabetic Wound/Ulcer of the Lower Extremity] [10:Diabetic Wound/Ulcer of the Lower Extremity] [2:Diabetic Wound/Ulcer of the Lower Extremity] Comorbid History: [1:Anemia, Lymphedema, Congestive Heart Failure, Hypertension, Peripheral Venous Disease, Type II Diabetes, Neuropathy] [10:Anemia, Lymphedema, Congestive Heart Failure, Hypertension, Peripheral Venous Disease, Type II Diabetes,  Neuropathy] [2:Anemia,  Lymphedema, Congestive Heart Failure, Hypertension, Peripheral Venous Disease, Type II Diabetes, Neuropathy] Date Acquired: [1:06/11/2016] [10:01/28/2017] [2:06/11/2016] Weeks of Treatment: [1:23] [10:5] [2:23] Wound Status: [1:Open] [10:Open] [2:Open] Pending Amputation on [1:Yes] [10:No] [2:Yes] Presentation: Measurements L x W x D [1:1.6x1.8x0.1] [10:0.1x0.1x0.1] [2:1x1.8x0.1] (cm) Area (cm) : [1:2.262] [10:0.008] [2:1.414] Volume (cm) : [1:0.226] [10:0.001] [2:0.141] % Reduction in Area: [1:95.20%] [10:99.30%] [2:54.00%] % Reduction in Volume: [1:95.20%] [10:99.90%] [2:54.10%] Classification: [1:Grade 1] [10:Grade 2] [2:Grade 1] Exudate Amount: [1:Large] [10:Large] [2:Large] Exudate Type: [1:Serosanguineous] [10:Serous] [2:Serosanguineous] Exudate Color: [1:red, brown] [10:amber] [2:red, brown] Foul Odor After Cleansing: [1:No] [10:No] [2:No] Odor Anticipated Due to [1:N/A] [10:N/A] [2:N/A] Product Use: Wound Margin:  [1:Flat and Intact] [10:Flat and Intact] [2:Flat and Intact] Granulation Amount: [1:Medium (34-66%)] [10:Small (1-33%)] [2:Medium (34-66%)] Granulation Quality: [1:Pink] [10:Red] [2:Pink] Necrotic Amount: [1:Medium (34-66%)] [10:Large (67-100%)] [2:Medium (34-66%)] Necrotic Tissue: [1:Adherent Slough] [10:Eschar, Adherent Slough] [2:Adherent Slough] Exposed Structures: [1:Fat Layer (Subcutaneous Tissue) Exposed: Yes Fascia: No Tendon: No] [10:Fat Layer (Subcutaneous Tissue) Exposed: Yes Fascia: No Tendon: No] [2:Fat Layer (Subcutaneous Tissue) Exposed: Yes Fascia: No Tendon: No] Muscle: No Muscle: No Muscle: No Joint: No Joint: No Joint: No Bone: No Bone: No Bone: No Epithelialization: Small (1-33%) None Small (1-33%) Periwound Skin Texture: Excoriation: Yes Excoriation: No Excoriation: Yes Induration: No Induration: No Induration: No Callus: No Callus: No Callus: No Crepitus: No Crepitus: No Crepitus: No Rash: No Rash: No Rash: No Scarring: No Scarring: No Scarring: No Periwound Skin Moisture: Maceration: Yes Maceration: Yes Maceration: Yes Dry/Scaly: No Dry/Scaly: No Dry/Scaly: No Periwound Skin Color: Atrophie Blanche: No Atrophie Blanche: No Atrophie Blanche: No Cyanosis: No Cyanosis: No Cyanosis: No Ecchymosis: No Ecchymosis: No Ecchymosis: No Erythema: No Erythema: No Erythema: No Hemosiderin Staining: No Hemosiderin Staining: No Hemosiderin Staining: No Mottled: No Mottled: No Mottled: No Pallor: No Pallor: No Pallor: No Rubor: No Rubor: No Rubor: No Temperature: No Abnormality No Abnormality No Abnormality Tenderness on Palpation: No No No Wound Preparation: Ulcer Cleansing: Ulcer Cleansing: Ulcer Cleansing: Rinsed/Irrigated with Saline Rinsed/Irrigated with Saline Rinsed/Irrigated with Saline Topical Anesthetic Applied: None Wound Number: [id number]  Photos: No Photos No Photos No Photos Wound Location: Left Toe Great Left Toe Second  Left Toe Third Wounding Event: Gradually Appeared Gradually Appeared Gradually Appeared Primary Etiology: Diabetic Wound/Ulcer of 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 Lower Extremity Lower Extremity Comorbid History: Anemia, Lymphedema, Anemia, Lymphedema, Anemia, Lymphedema, Congestive Heart Failure, Congestive Heart Failure, Congestive Heart Failure, Hypertension, Peripheral Hypertension, Peripheral Hypertension, Peripheral Venous Disease, Type II Venous Disease, Type II Venous Disease, Type II Diabetes, Neuropathy Diabetes, Neuropathy Diabetes, Neuropathy Date Acquired: 06/11/2016 06/11/2016 12/30/2016 Weeks of Treatment: 23 23 9  Wound Status: Open Open Open Pending Amputation on Yes Yes No Presentation: Measurements L x W x D 0.8x0.3x0.1 2x1.4x0.1 0.1x0.1x0.1 (cm) Area (cm) : 0.188 2.199 0.008 Volume (cm) : 0.019 0.22 0.001 % Reduction in Area: 98.70% -2.90% 99.00% % Reduction in Volume: 98.70% -2.80% 98.80% Classification: Grade 1 Grade 1 Grade 1 Exudate Amount: Large Large Large Exudate Type: Serous Serous Serosanguineous Exudate Color: amber amber red, brown Foul Odor After Cleansing: Yes No No Odor Anticipated Due to No N/A N/A Product Use: Wound Margin: Flat and Intact Flat and Intact Distinct, outline attached Dugger, Cruzito E. ([id number]) Granulation Amount: Medium (34-66%) Medium (34-66%) Medium (34-66%) Granulation Quality: Pink Pink Red Necrotic Amount: Medium (34-66%) Medium (34-66%) Medium (34-66%) Necrotic Tissue: Adherent [place] Exposed Structures: Fat Layer (Subcutaneous Fat Layer (Subcutaneous N/A Tissue) Exposed:  Yes Tissue) Exposed: Yes Fascia: No Fascia: No Tendon: No Tendon: No Muscle: No Muscle: No Joint: No Joint: No Bone: No Bone: No Epithelialization: Small (1-33%) Small (1-33%) None Periwound Skin Texture: Excoriation: Yes Excoriation: Yes No Abnormalities  Noted Induration: No Induration: No Callus: No Callus: No Crepitus: No Crepitus: No Rash: No Rash: No Scarring: No Scarring: No Periwound Skin Moisture: Maceration: Yes Maceration: Yes Maceration: Yes Dry/Scaly: No Dry/Scaly: No Periwound Skin Color: Atrophie Blanche: No Atrophie Blanche: No No Abnormalities Noted Cyanosis: No Cyanosis: No Ecchymosis: No Ecchymosis: No Erythema: No Erythema: No Hemosiderin Staining: No Hemosiderin Staining: No Mottled: No Mottled: No Pallor: No Pallor: No Rubor: No Rubor: No Temperature: No Abnormality No Abnormality No Abnormality Tenderness on Palpation: No No Yes Wound Preparation: Ulcer Cleansing: Ulcer Cleansing: Ulcer Cleansing: Rinsed/Irrigated with Saline Rinsed/Irrigated with Saline Rinsed/Irrigated with Saline Wound Number: 9 N/A N/A Photos: No Photos N/A N/A Wound Location: Right Toe Third N/A N/A Wounding Event: Gradually Appeared N/A N/A Primary Etiology: Diabetic Wound/Ulcer of the N/A N/A Lower Extremity Comorbid History: Anemia, Lymphedema, N/A N/A Congestive Heart Failure, Hypertension, Peripheral Venous Disease, Type II Diabetes, Neuropathy Date Acquired: 12/31/2016 N/A N/A Weeks of Treatment: 9 N/A N/A Wound Status: Open N/A N/A Pending Amputation on No N/A N/A Presentation: Measurements L x W x D 0.7x0.6x0.1 N/A N/A (cm) Area (cm) : 0.33 N/A N/A Volume (cm) : 0.033 N/A N/A % Reduction in Area: 16.00% N/A N/A % Reduction in Volume: 15.40% N/A N/A Classification: Grade 1 N/A N/A Exudate Amount: Large N/A N/A Exudate Type: Serous N/A N/A Hoelzel, Ion E. ([id number]) Exudate Color: amber N/A N/A Foul Odor After Cleansing: Yes N/A N/A Odor Anticipated Due to No N/A N/A Product Use: Wound Margin: Flat and Intact N/A N/A Granulation Amount: Medium (34-66%) N/A N/A Granulation Quality: Pink N/A N/A Necrotic Amount: Medium (34-66%) N/A N/A Necrotic Tissue: Adherent Slough N/A N/A Exposed  Structures: Fascia: No N/A N/A Fat Layer (Subcutaneous Tissue) Exposed: No Tendon: No Muscle: No Joint: No Bone: No Epithelialization: Medium (34-66%) N/A N/A Periwound Skin Texture: Excoriation: No N/A N/A Induration: No Callus: No Crepitus: No Rash: No Scarring: No Periwound Skin Moisture: Maceration: Yes N/A N/A Dry/Scaly: No Periwound Skin Color: Atrophie Blanche: No N/A N/A Cyanosis: No Ecchymosis: No Erythema: No Hemosiderin Staining: No Mottled: No Pallor: No Rubor: No Temperature: No Abnormality N/A N/A Tenderness on Palpation: No N/A N/A Wound Preparation: Ulcer Cleansing: N/A N/A Rinsed/Irrigated with Saline Treatment Notes Electronic Signature(s) Signed: 03/07/2017 11:31:30 AM By: Christin Fudge MD, FACS Entered By: Christin Fudge on 03/07/2017 11:31:30 Reineck, [name] ([id number]) -------------------------------------------------------------------------------- Cache Details Patient Name: Lotter, Secundino E. Date of Service: 03/07/2017 11:00 AM Medical Record Number: [id number] Patient Account Number: [id number] Date of Birth/Sex: 1951/03/28 (66 y.o. Male) Treating RN: Montey Hora Primary Care Yehya Brendle: Myrtie Hawk Other Clinician: Referring Yerania Chamorro: Myrtie Hawk Treating Harmoney Sienkiewicz/Extender: Frann Rider in Treatment: 94 Active Inactive ` Abuse / Safety / Falls / Self Care Management Nursing Diagnoses: Potential for falls Goals: Patient will remain injury free related to falls Date Initiated: 09/21/2016 Target Resolution Date: 11/30/2016 Goal Status: Active Interventions: Assess fall risk on admission and as needed Notes: ` Nutrition Nursing Diagnoses: Potential for alteratiion in Nutrition/Potential for imbalanced nutrition Goals: Patient/caregiver agrees to and verbalizes understanding of need to use nutritional supplements and/or vitamins as prescribed Date Initiated: 09/21/2016 Target  Resolution Date: 11/30/2016 Goal Status: Active Interventions: Assess patient nutrition upon admission and as needed per policy Notes: ` Orientation to the  Wound Care Program Nursing Diagnoses: Knowledge deficit related to the wound healing center program Goals: Patient/caregiver will verbalize understanding of the [facility] Date Initiated: 09/21/2016 Target Resolution Date: 11/30/2016 Goal Status: Active Interventions: DARKAlecxis, Baltzell ([id number]) Provide education on orientation to the wound center Notes: ` Wound/Skin Impairment Nursing Diagnoses: Knowledge deficit related to smoking impact on wound healing Goals: Ulcer/skin breakdown will have a volume reduction of 30% by week 4 Date Initiated: 09/21/2016 Target Resolution Date: 11/30/2016 Goal Status: Active Ulcer/skin breakdown will have a volume reduction of 50% by week 8 Date Initiated: 09/21/2016 Target Resolution Date: 11/30/2016 Goal Status: Active Ulcer/skin breakdown will have a volume reduction of 80% by week 12 Date Initiated: 09/21/2016 Target Resolution Date: 11/30/2016 Goal Status: Active Ulcer/skin breakdown will heal within 14 weeks Date Initiated: 09/21/2016 Target Resolution Date: 11/30/2016 Goal Status: Active Interventions: Assess patient/caregiver ability to obtain necessary supplies Assess patient/caregiver ability to perform ulcer/skin care regimen upon admission and as needed Assess ulceration(s) every visit Notes: Electronic Signature(s) Signed: 03/08/2017 4:46:33 PM By: Montey Hora Entered By: Montey Hora on 03/07/2017 11:25:12 Grace, [name] ([id number]) -------------------------------------------------------------------------------- Pain Assessment Details Patient Name: Storie, Quanta E. Date of Service: 03/07/2017 11:00 AM Medical Record Number: [id number] Patient Account Number: [id number] Date of Birth/Sex: Mar 17, 1951 (66 y.o. Male) Treating RN: Montey Hora Primary  Care Ladamien Rammel: Myrtie Hawk Other Clinician: Referring Coti Burd: Myrtie Hawk Treating Audrena Talaga/Extender: Frann Rider in Treatment: 23 Active Problems Location of Pain Severity and Description of Pain Patient Has Paino No Site Locations Pain Management and Medication Current Pain Management: Notes Topical or injectable lidocaine is offered to patient for acute pain when surgical debridement is performed. If needed, Patient is instructed to use over the counter pain medication for the following 24-48 hours after debridement. Wound care MDs do not prescribed pain medications. Patient has chronic pain or uncontrolled pain. Patient has been instructed to make an appointment with their Primary Care Physician for pain management. Electronic Signature(s) Signed: 03/08/2017 4:46:33 PM By: Montey Hora Entered By: Montey Hora on 03/07/2017 11:10:05 Hayduk, [name] ([id number]) -------------------------------------------------------------------------------- Patient/Caregiver Education Details Patient Name: Davern, Tyrus E. Date of Service: 03/07/2017 11:00 AM Medical Record Number: [id number] Patient Account Number: [id number] Date of Birth/Gender: 27-Oct-1950 (66 y.o. Male) Treating RN: Montey Hora Primary Care Physician: Myrtie Hawk Other Clinician: Referring Physician: Myrtie Hawk Treating Physician/Extender: Frann Rider in Treatment: 73 Education Assessment Education Provided To: Patient Education Topics Provided Wound/Skin Impairment: Handouts: Other: wound care as ordered Methods: Demonstration, Explain/Verbal Responses: State content correctly Electronic Signature(s) Signed: 03/08/2017 4:46:33 PM By: Montey Hora Entered By: Montey Hora on 03/07/2017 14:52:59 Simmering, [name] ([id number]) -------------------------------------------------------------------------------- Wound Assessment Details Patient Name:  Dimino, Verlie E. Date of Service: 03/07/2017 11:00 AM Medical Record Number: [id number] Patient Account Number: [id number] Date of Birth/Sex: Jan 16, 1951 (66 y.o. Male) Treating RN: Montey Hora Primary Care Sonnie Bias: Myrtie Hawk Other Clinician: Referring Tyjanae Bartek: Myrtie Hawk Treating Mariabella Nilsen/Extender: Frann Rider in Treatment: 23 Wound Status Wound Number: 1 Primary Diabetic Wound/Ulcer of the Lower Extremity Etiology: Wound Location: Right Toe Great Wound Open Wounding Event: Gradually Appeared Status: Date Acquired: 06/11/2016 Comorbid Anemia, Lymphedema, Congestive Heart Weeks Of Treatment: 23 History: Failure, Hypertension, Peripheral Venous Clustered Wound: No Disease, Type II Diabetes, Neuropathy Pending Amputation On Presentation Photos Photo Uploaded By: Montey Hora on 03/08/2017 12:05:39 Wound Measurements Length: (cm) 1.6 Width: (cm) 1.8 Depth: (cm) 0.1 Area: (cm) 2.262 Volume: (cm) 0.226 % Reduction in Area: 95.2% % Reduction in Volume: 95.2% Epithelialization: Small (  1-33%) Tunneling: No Undermining: No Wound Description Classification: Grade 1 Wound Margin: Flat and Intact Exudate Amount: Large Exudate Type: Serosanguineous Exudate Color: red, brown Foul Odor After Cleansing: No Slough/Fibrino Yes Wound Bed Granulation Amount: Medium (34-66%) Exposed Structure Granulation Quality: Pink Fascia Exposed: No Necrotic Amount: Medium (34-66%) Fat Layer (Subcutaneous Tissue) Exposed: Yes Necrotic Quality: Adherent Slough Tendon Exposed: No Muscle Exposed: No Joint Exposed: No Bone Exposed: No Periwound Skin Texture Derasmo, Maksym E. ([id number]) Texture Color No Abnormalities Noted: No No Abnormalities Noted: No Callus: No Atrophie Blanche: No Crepitus: No Cyanosis: No Excoriation: Yes Ecchymosis: No Induration: No Erythema: No Rash: No Hemosiderin Staining: No Scarring: No Mottled: No Pallor:  No Moisture Rubor: No No Abnormalities Noted: No Dry / Scaly: No Temperature / Pain Maceration: Yes Temperature: No Abnormality Wound Preparation Ulcer Cleansing: Rinsed/Irrigated with Saline Topical Anesthetic Applied: None Treatment Notes Wound #1 (Right Toe Great) 1. Cleansed with: Clean wound with Normal Saline 4. Dressing Applied: Aquacel Ag 5. Secondary Dressing Applied Dry Gauze Kerlix/Conform Notes Nystatin cream, Sivercell, ABD, conform and stretch net Electronic Signature(s) Signed: 03/07/2017 11:19:25 AM By: Montey Hora Entered By: Montey Hora on 03/07/2017 11:19:24 [place], [name] ([id number]) -------------------------------------------------------------------------------- Wound Assessment Details Patient Name: Crist, Quintus E. Date of Service: 03/07/2017 11:00 AM Medical Record Number: [id number] Patient Account Number: [id number] Date of Birth/Sex: 07-Nov-1950 (66 y.o. Male) Treating RN: Montey Hora Primary Care Alger Kerstein: Myrtie Hawk Other Clinician: Referring Mirtha Jain: Myrtie Hawk Treating Monie Shere/Extender: Frann Rider in Treatment: 23 Wound Status Wound Number: 10 Primary Diabetic Wound/Ulcer of the Lower Extremity Etiology: Wound Location: Right Toe Fourth Wound Open Wounding Event: Not Known Status: Date Acquired: 01/28/2017 Comorbid Anemia, Lymphedema, Congestive Heart Weeks Of Treatment: 5 History: Failure, Hypertension, Peripheral Venous Clustered Wound: No Disease, Type II Diabetes, Neuropathy Photos Photo Uploaded By: Montey Hora on 03/08/2017 12:06:16 Wound Measurements Length: (cm) 0.1 Width: (cm) 0.1 Depth: (cm) 0.1 Area: (cm) 0.008 Volume: (cm) 0.001 % Reduction in Area: 99.3% % Reduction in Volume: 99.9% Epithelialization: None Tunneling: No Undermining: No Wound Description Classification: Grade 2 Wound Margin: Flat and Intact Exudate Amount: Large Exudate Type: Serous Exudate  Color: amber Foul Odor After Cleansing: No Slough/Fibrino Yes Wound Bed Granulation Amount: Small (1-33%) Exposed Structure Granulation Quality: Red Fascia Exposed: No Necrotic Amount: Large (67-100%) Fat Layer (Subcutaneous Tissue) Exposed: Yes Necrotic Quality: Eschar, Adherent Slough Tendon Exposed: No Muscle Exposed: No Joint Exposed: No Bone Exposed: No Periwound Skin Texture Wootan, Jayzon E. ([id number]) Texture Color No Abnormalities Noted: No No Abnormalities Noted: No Callus: No Atrophie Blanche: No Crepitus: No Cyanosis: No Excoriation: No Ecchymosis: No Induration: No Erythema: No Rash: No Hemosiderin Staining: No Scarring: No Mottled: No Pallor: No Moisture Rubor: No No Abnormalities Noted: No Dry / Scaly: No Temperature / Pain Maceration: Yes Temperature: No Abnormality Wound Preparation Ulcer Cleansing: Rinsed/Irrigated with Saline Treatment Notes Wound #10 (Right Toe Fourth) 1. Cleansed with: Clean wound with Normal Saline 4. Dressing Applied: Aquacel Ag 5. Secondary Dressing Applied Dry Gauze Kerlix/Conform Notes Nystatin cream, Sivercell, ABD, conform and stretch net Electronic Signature(s) Signed: 03/07/2017 11:19:40 AM By: Montey Hora Entered By: Montey Hora on 03/07/2017 11:19:40 Sooy, [name] ([id number]) -------------------------------------------------------------------------------- Wound Assessment Details Patient Name: Scafidi, Landon E. Date of Service: 03/07/2017 11:00 AM Medical Record Number: [id number] Patient Account Number: [id number] Date of Birth/Sex: 1950-08-21 (66 y.o. Male) Treating RN: Montey Hora Primary Care Rojelio Uhrich: Myrtie Hawk Other Clinician: Referring Beaux Verne: Myrtie Hawk Treating Rosemae Mcquown/Extender: Frann Rider in Treatment: 23 Wound Status Wound  Number: 2 Primary Diabetic Wound/Ulcer of the Lower Extremity Etiology: Wound Location: Right Toe Second Wound Open Wounding  Event: Gradually Appeared Status: Date Acquired: 06/11/2016 Comorbid Anemia, Lymphedema, Congestive Heart Weeks Of Treatment: 23 History: Failure, Hypertension, Peripheral Venous Clustered Wound: No Disease, Type II Diabetes, Neuropathy Pending Amputation On Presentation Photos Photo Uploaded By: Montey Hora on 03/08/2017 12:06:16 Wound Measurements Length: (cm) 1 Width: (cm) 1.8 Depth: (cm) 0.1 Area: (cm) 1.414 Volume: (cm) 0.141 % Reduction in Area: 54% % Reduction in Volume: 54.1% Epithelialization: Small (1-33%) Tunneling: No Undermining: No Wound Description Classification: Grade 1 Wound Margin: Flat and Intact Exudate Amount: Large Exudate Type: Serosanguineous Exudate Color: red, brown Foul Odor After Cleansing: No Slough/Fibrino Yes Wound Bed Granulation Amount: Medium (34-66%) Exposed Structure Granulation Quality: Pink Fascia Exposed: No Necrotic Amount: Medium (34-66%) Fat Layer (Subcutaneous Tissue) Exposed: Yes Necrotic Quality: Adherent Slough Tendon Exposed: No Muscle Exposed: No Joint Exposed: No Bone Exposed: No Periwound Skin Texture Labriola, Bosco E. ([id number]) Texture Color No Abnormalities Noted: No No Abnormalities Noted: No Callus: No Atrophie Blanche: No Crepitus: No Cyanosis: No Excoriation: Yes Ecchymosis: No Induration: No Erythema: No Rash: No Hemosiderin Staining: No Scarring: No Mottled: No Pallor: No Moisture Rubor: No No Abnormalities Noted: No Dry / Scaly: No Temperature / Pain Maceration: Yes Temperature: No Abnormality Wound Preparation Ulcer Cleansing: Rinsed/Irrigated with Saline Treatment Notes Wound #2 (Right Toe Second) 1. Cleansed with: Clean wound with Normal Saline 4. Dressing Applied: Aquacel Ag 5. Secondary Dressing Applied Dry Gauze Kerlix/Conform Notes Nystatin cream, Sivercell, ABD, conform and stretch net Electronic Signature(s) Signed: 03/07/2017 11:19:55 AM By: Montey Hora Entered  By: Montey Hora on 03/07/2017 11:19:54 Strahan, [name] ([id number]) -------------------------------------------------------------------------------- Wound Assessment Details Patient Name: Morath, Escher E. Date of Service: 03/07/2017 11:00 AM Medical Record Number: [id number] Patient Account Number: [id number] Date of Birth/Sex: 26-Apr-1950 (66 y.o. Male) Treating RN: Montey Hora Primary Care Edy Mcbane: Myrtie Hawk Other Clinician: Referring Earleen Aoun: Myrtie Hawk Treating Brigette Hopfer/Extender: Frann Rider in Treatment: 23 Wound Status Wound Number: 5 Primary Diabetic Wound/Ulcer of the Lower Extremity Etiology: Wound Location: Left Toe Great Wound Open Wounding Event: Gradually Appeared Status: Date Acquired: 06/11/2016 Comorbid Anemia, Lymphedema, Congestive Heart Weeks Of Treatment: 23 History: Failure, Hypertension, Peripheral Venous Clustered Wound: No Disease, Type II Diabetes, Neuropathy Pending Amputation On Presentation Photos Photo Uploaded By: Montey Hora on 03/08/2017 12:06:53 Wound Measurements Length: (cm) 0.8 Width: (cm) 0.3 Depth: (cm) 0.1 Area: (cm) 0.188 Volume: (cm) 0.019 % Reduction in Area: 98.7% % Reduction in Volume: 98.7% Epithelialization: Small (1-33%) Tunneling: No Undermining: No Wound Description Classification: Grade 1 Wound Margin: Flat and Intact Exudate Amount: Large Exudate Type: Serous Exudate Color: amber Foul Odor After Cleansing: Yes Due to Product Use: No Slough/Fibrino Yes Wound Bed Granulation Amount: Medium (34-66%) Exposed Structure Granulation Quality: Pink Fascia Exposed: No Necrotic Amount: Medium (34-66%) Fat Layer (Subcutaneous Tissue) Exposed: Yes Necrotic Quality: Adherent Slough Tendon Exposed: No Muscle Exposed: No Joint Exposed: No Bone Exposed: No Periwound Skin Texture Knights, Kailo E. ([id number]) Texture Color No Abnormalities Noted: No No Abnormalities Noted:  No Callus: No Atrophie Blanche: No Crepitus: No Cyanosis: No Excoriation: Yes Ecchymosis: No Induration: No Erythema: No Rash: No Hemosiderin Staining: No Scarring: No Mottled: No Pallor: No Moisture Rubor: No No Abnormalities Noted: No Dry / Scaly: No Temperature / Pain Maceration: Yes Temperature: No Abnormality Wound Preparation Ulcer Cleansing: Rinsed/Irrigated with Saline Treatment Notes Wound #5 (Left Toe Great) 1. Cleansed with: Clean wound with Normal Saline 4. Dressing  Applied: Aquacel Ag 5. Secondary Dressing Applied Dry Gauze Kerlix/Conform Notes Nystatin cream, Sivercell, ABD, conform and stretch net Electronic Signature(s) Signed: 03/07/2017 11:20:10 AM By: Montey Hora Entered By: Montey Hora on 03/07/2017 11:20:09 Foskey, Golden E. ([id number]) -------------------------------------------------------------------------------- Wound Assessment Details Patient Name: Pendry, Taejon E. Date of Service: 03/07/2017 11:00 AM Medical Record Number: [id number] Patient Account Number: [id number] Date of Birth/Sex: [birth year] (66 y.o. Male) Treating RN: Montey Hora Primary Care Makaiah Terwilliger: Myrtie Hawk Other Clinician: Referring Dontarious Schaum: Myrtie Hawk Treating Christia Coaxum/Extender: Frann Rider in Treatment: 23 Wound Status Wound Number: 6 Primary Diabetic Wound/Ulcer of the Lower Extremity Etiology: Wound Location: Left Toe Second Wound Open Wounding Event: Gradually Appeared Status: Date Acquired: 06/11/2016 Comorbid Anemia, Lymphedema, Congestive Heart Weeks Of Treatment: 23 History: Failure, Hypertension, Peripheral Venous Clustered Wound: No Disease, Type II Diabetes, Neuropathy Pending Amputation On Presentation Photos Photo Uploaded By: Montey Hora on 03/08/2017 12:07:26 Wound Measurements Length: (cm) 2 Width: (cm) 1.4 Depth: (cm) 0.1 Area: (cm) 2.199 Volume: (cm) 0.22 % Reduction in Area: -2.9% % Reduction  in Volume: -2.8% Epithelialization: Small (1-33%) Tunneling: No Undermining: No Wound Description Classification: Grade 1 Wound Margin: Flat and Intact Exudate Amount: Large Exudate Type: Serous Exudate Color: amber Foul Odor After Cleansing: No Slough/Fibrino Yes Wound Bed Granulation Amount: Medium (34-66%) Exposed Structure Granulation Quality: Pink Fascia Exposed: No Necrotic Amount: Medium (34-66%) Fat Layer (Subcutaneous Tissue) Exposed: Yes Necrotic Quality: Adherent Slough Tendon Exposed: No Muscle Exposed: No Joint Exposed: No Bone Exposed: No Periwound Skin Texture Vreeland, [name] E. ([id number]) Texture Color No Abnormalities Noted: No No Abnormalities Noted: No Callus: No Atrophie Blanche: No Crepitus: No Cyanosis: No Excoriation: Yes Ecchymosis: No Induration: No Erythema: No Rash: No Hemosiderin Staining: No Scarring: No Mottled: No Pallor: No Moisture Rubor: No No Abnormalities Noted: No Dry / Scaly: No Temperature / Pain Maceration: Yes Temperature: No Abnormality Wound Preparation Ulcer Cleansing: Rinsed/Irrigated with Saline Treatment Notes Wound #6 (Left Toe Second) 1. Cleansed with: Clean wound with Normal Saline 4. Dressing Applied: Aquacel Ag 5. Secondary Dressing Applied Dry Gauze Kerlix/Conform Notes Nystatin cream, Sivercell, ABD, conform and stretch net Electronic Signature(s) Signed: 03/07/2017 11:20:27 AM By: Montey Hora Entered By: Montey Hora on 03/07/2017 11:20:26 Kucinski, Kadence E. ([id number]) -------------------------------------------------------------------------------- Wound Assessment Details Patient Name: Keach, Sami E. Date of Service: 03/07/2017 11:00 AM Medical Record Number: [id number] Patient Account Number: [id number] Date of Birth/Sex: 1951-01-10 (66 y.o. Male) Treating RN: Montey Hora Primary Care Ajamu Maxon: Myrtie Hawk Other Clinician: Referring Latice Waitman: Myrtie Hawk Treating  Nykerria Macconnell/Extender: Frann Rider in Treatment: 23 Wound Status Wound Number: 8 Primary Diabetic Wound/Ulcer of the Lower Extremity Etiology: Wound Location: Left Toe Third Wound Open Wounding Event: Gradually Appeared Status: Date Acquired: 12/30/2016 Comorbid Anemia, Lymphedema, Congestive Heart Weeks Of Treatment: 9 History: Failure, Hypertension, Peripheral Venous Clustered Wound: No Disease, Type II Diabetes, Neuropathy Photos Photo Uploaded By: Montey Hora on 03/08/2017 12:07:27 Wound Measurements Length: (cm) 0.1 Width: (cm) 0.1 Depth: (cm) 0.1 Area: (cm) 0.008 Volume: (cm) 0.001 % Reduction in Area: 99% % Reduction in Volume: 98.8% Epithelialization: None Tunneling: No Undermining: No Wound Description Classification: Grade 1 Wound Margin: Distinct, outline attached Exudate Amount: Large Exudate Type: Serosanguineous Exudate Color: red, brown Foul Odor After Cleansing: No Slough/Fibrino Yes Wound Bed Granulation Amount: Medium (34-66%) Granulation Quality: Red Necrotic Amount: Medium (34-66%) Necrotic Quality: Adherent Slough Periwound Skin Texture Texture Color No Abnormalities Noted: No No Abnormalities Noted: No Moisture Temperature / Pain Montag, Jerimiah E. ([id number]) No Abnormalities Noted: No Temperature: No  Abnormality Maceration: Yes Tenderness on Palpation: Yes Wound Preparation Ulcer Cleansing: Rinsed/Irrigated with Saline Treatment Notes Wound #8 (Left Toe Third) 1. Cleansed with: Clean wound with Normal Saline 4. Dressing Applied: Aquacel Ag 5. Secondary Dressing Applied Dry Gauze Kerlix/Conform Notes Nystatin cream, Sivercell, ABD, conform and stretch net Electronic Signature(s) Signed: 03/07/2017 11:20:43 AM By: Montey Hora Entered By: Montey Hora on 03/07/2017 11:20:42 Symonette, Krosby E. ([id number]) -------------------------------------------------------------------------------- Wound Assessment Details Patient  Name: Fuentes, Rommel E. Date of Service: 03/07/2017 11:00 AM Medical Record Number: [id number] Patient Account Number: [id number] Date of Birth/Sex: 08-Feb-1951 (66 y.o. Male) Treating RN: Montey Hora Primary Care Irasema Chalk: Myrtie Hawk Other Clinician: Referring Chaia Ikard: Myrtie Hawk Treating Karsen Fellows/Extender: Frann Rider in Treatment: 23 Wound Status Wound Number: 9 Primary Diabetic Wound/Ulcer of the Lower Extremity Etiology: Wound Location: Right Toe Third Wound Open Wounding Event: Gradually Appeared Status: Date Acquired: 12/31/2016 Comorbid Anemia, Lymphedema, Congestive Heart Weeks Of Treatment: 9 History: Failure, Hypertension, Peripheral Venous Clustered Wound: No Disease, Type II Diabetes, Neuropathy Photos Photo Uploaded By: Montey Hora on 03/08/2017 12:08:02 Wound Measurements Length: (cm) 0.7 Width: (cm) 0.6 Depth: (cm) 0.1 Area: (cm) 0.33 Volume: (cm) 0.033 % Reduction in Area: 16% % Reduction in Volume: 15.4% Epithelialization: Medium (34-66%) Tunneling: No Undermining: No Wound Description Classification: Grade 1 Wound Margin: Flat and Intact Exudate Amount: Large Exudate Type: Serous Exudate Color: amber Foul Odor After Cleansing: Yes Due to Product Use: No Slough/Fibrino Yes Wound Bed Granulation Amount: Medium (34-66%) Exposed Structure Granulation Quality: Pink Fascia Exposed: No Necrotic Amount: Medium (34-66%) Fat Layer (Subcutaneous Tissue) Exposed: No Necrotic Quality: Adherent Slough Tendon Exposed: No Muscle Exposed: No Joint Exposed: No Bone Exposed: No Periwound Skin Texture Sokolowski, Rollan E. ([id number]) Texture Color No Abnormalities Noted: No No Abnormalities Noted: No Callus: No Atrophie Blanche: No Crepitus: No Cyanosis: No Excoriation: No Ecchymosis: No Induration: No Erythema: No Rash: No Hemosiderin Staining: No Scarring: No Mottled: No Pallor: No Moisture Rubor: No No  Abnormalities Noted: No Dry / Scaly: No Temperature / Pain Maceration: Yes Temperature: No Abnormality Wound Preparation Ulcer Cleansing: Rinsed/Irrigated with Saline Treatment Notes Wound #9 (Right Toe Third) 1. Cleansed with: Clean wound with Normal Saline 4. Dressing Applied: Aquacel Ag 5. Secondary Dressing Applied Dry Gauze Kerlix/Conform Notes Nystatin cream, Sivercell, ABD, conform and stretch net Electronic Signature(s) Signed: 03/07/2017 11:21:14 AM By: Montey Hora Entered By: Montey Hora on 03/07/2017 11:21:13 Labrie, [name] ([id number]) -------------------------------------------------------------------------------- Vitals Details Patient Name: Kniskern, Romain E. Date of Service: 03/07/2017 11:00 AM Medical Record Number: [id number] Patient Account Number: [id number] Date of Birth/Sex: 1950/06/11 (66 y.o. Male) Treating RN: Montey Hora Primary Care Yaeli Hartung: Myrtie Hawk Other Clinician: Referring Ely Spragg: Myrtie Hawk Treating Xavia Kniskern/Extender: Frann Rider in Treatment: 23 Vital Signs Time Taken: 11:10 Temperature (F): 98.3 Height (in): 69 Pulse (bpm): 67 Weight (lbs): 168 Respiratory Rate (breaths/min): 18 Body Mass Index (BMI): 24.8 Blood Pressure (mmHg): 166/79 Reference Range: 80 - 120 mg / dl Electronic Signature(s) Signed: 03/08/2017 4:46:33 PM By: Montey Hora Entered By: Montey Hora on 03/07/2017 11:10:50

## 2017-03-12 ENCOUNTER — Inpatient Hospital Stay: Payer: Medicare HMO | Attending: Oncology

## 2017-03-12 ENCOUNTER — Inpatient Hospital Stay: Payer: Medicare HMO

## 2017-03-12 VITALS — BP 151/81 | HR 66

## 2017-03-12 DIAGNOSIS — F17210 Nicotine dependence, cigarettes, uncomplicated: Secondary | ICD-10-CM | POA: Insufficient documentation

## 2017-03-12 DIAGNOSIS — D509 Iron deficiency anemia, unspecified: Secondary | ICD-10-CM

## 2017-03-12 DIAGNOSIS — E1122 Type 2 diabetes mellitus with diabetic chronic kidney disease: Secondary | ICD-10-CM | POA: Diagnosis not present

## 2017-03-12 DIAGNOSIS — N189 Chronic kidney disease, unspecified: Secondary | ICD-10-CM | POA: Insufficient documentation

## 2017-03-12 DIAGNOSIS — R591 Generalized enlarged lymph nodes: Secondary | ICD-10-CM | POA: Diagnosis not present

## 2017-03-12 DIAGNOSIS — K219 Gastro-esophageal reflux disease without esophagitis: Secondary | ICD-10-CM | POA: Insufficient documentation

## 2017-03-12 DIAGNOSIS — I130 Hypertensive heart and chronic kidney disease with heart failure and stage 1 through stage 4 chronic kidney disease, or unspecified chronic kidney disease: Secondary | ICD-10-CM | POA: Diagnosis not present

## 2017-03-12 DIAGNOSIS — M5126 Other intervertebral disc displacement, lumbar region: Secondary | ICD-10-CM | POA: Insufficient documentation

## 2017-03-12 DIAGNOSIS — I509 Heart failure, unspecified: Secondary | ICD-10-CM | POA: Diagnosis not present

## 2017-03-12 DIAGNOSIS — D649 Anemia, unspecified: Secondary | ICD-10-CM

## 2017-03-12 DIAGNOSIS — M5137 Other intervertebral disc degeneration, lumbosacral region: Secondary | ICD-10-CM | POA: Diagnosis not present

## 2017-03-12 DIAGNOSIS — R188 Other ascites: Secondary | ICD-10-CM | POA: Insufficient documentation

## 2017-03-12 DIAGNOSIS — D631 Anemia in chronic kidney disease: Secondary | ICD-10-CM | POA: Insufficient documentation

## 2017-03-12 DIAGNOSIS — R911 Solitary pulmonary nodule: Secondary | ICD-10-CM | POA: Diagnosis not present

## 2017-03-12 DIAGNOSIS — D638 Anemia in other chronic diseases classified elsewhere: Secondary | ICD-10-CM

## 2017-03-12 DIAGNOSIS — Z862 Personal history of diseases of the blood and blood-forming organs and certain disorders involving the immune mechanism: Secondary | ICD-10-CM

## 2017-03-12 LAB — HEMOGLOBIN: HEMOGLOBIN: 8.5 g/dL — AB (ref 13.0–18.0)

## 2017-03-12 MED ORDER — EPOETIN ALFA 10000 UNIT/ML IJ SOLN
10000.0000 [IU] | INTRAMUSCULAR | Status: DC
  Administered 2017-03-12: 10000 [IU] via SUBCUTANEOUS

## 2017-03-19 ENCOUNTER — Encounter: Payer: Self-pay | Admitting: Oncology

## 2017-03-19 ENCOUNTER — Inpatient Hospital Stay: Payer: Medicare HMO

## 2017-03-19 DIAGNOSIS — I130 Hypertensive heart and chronic kidney disease with heart failure and stage 1 through stage 4 chronic kidney disease, or unspecified chronic kidney disease: Secondary | ICD-10-CM | POA: Diagnosis not present

## 2017-03-19 DIAGNOSIS — D638 Anemia in other chronic diseases classified elsewhere: Secondary | ICD-10-CM

## 2017-03-19 DIAGNOSIS — N189 Chronic kidney disease, unspecified: Secondary | ICD-10-CM

## 2017-03-19 DIAGNOSIS — Z862 Personal history of diseases of the blood and blood-forming organs and certain disorders involving the immune mechanism: Secondary | ICD-10-CM

## 2017-03-19 DIAGNOSIS — D649 Anemia, unspecified: Secondary | ICD-10-CM

## 2017-03-19 LAB — HEMOGLOBIN: Hemoglobin: 9 g/dL — ABNORMAL LOW (ref 13.0–18.0)

## 2017-03-19 NOTE — Progress Notes (Signed)
Blood pressure elevated today at 183/88 (1st attempt) 180/89 (2nd attempt) 186/91 (3rd attempt).  MD informed and patient not able to receive Procrit today due to hypertension.

## 2017-03-21 ENCOUNTER — Encounter: Payer: Medicare HMO | Attending: Surgery | Admitting: Surgery

## 2017-03-21 DIAGNOSIS — F1019 Alcohol abuse with unspecified alcohol-induced disorder: Secondary | ICD-10-CM | POA: Insufficient documentation

## 2017-03-21 DIAGNOSIS — L97522 Non-pressure chronic ulcer of other part of left foot with fat layer exposed: Secondary | ICD-10-CM | POA: Diagnosis not present

## 2017-03-21 DIAGNOSIS — I890 Lymphedema, not elsewhere classified: Secondary | ICD-10-CM | POA: Diagnosis not present

## 2017-03-21 DIAGNOSIS — E1140 Type 2 diabetes mellitus with diabetic neuropathy, unspecified: Secondary | ICD-10-CM | POA: Insufficient documentation

## 2017-03-21 DIAGNOSIS — Z7984 Long term (current) use of oral hypoglycemic drugs: Secondary | ICD-10-CM | POA: Insufficient documentation

## 2017-03-21 DIAGNOSIS — I87313 Chronic venous hypertension (idiopathic) with ulcer of bilateral lower extremity: Secondary | ICD-10-CM | POA: Diagnosis not present

## 2017-03-21 DIAGNOSIS — I509 Heart failure, unspecified: Secondary | ICD-10-CM | POA: Diagnosis not present

## 2017-03-21 DIAGNOSIS — M86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, right ankle and foot: Secondary | ICD-10-CM | POA: Diagnosis not present

## 2017-03-21 DIAGNOSIS — E11621 Type 2 diabetes mellitus with foot ulcer: Secondary | ICD-10-CM | POA: Insufficient documentation

## 2017-03-21 DIAGNOSIS — F17218 Nicotine dependence, cigarettes, with other nicotine-induced disorders: Secondary | ICD-10-CM | POA: Insufficient documentation

## 2017-03-21 DIAGNOSIS — I110 Hypertensive heart disease with heart failure: Secondary | ICD-10-CM | POA: Insufficient documentation

## 2017-03-21 DIAGNOSIS — D509 Iron deficiency anemia, unspecified: Secondary | ICD-10-CM | POA: Insufficient documentation

## 2017-03-21 DIAGNOSIS — L97512 Non-pressure chronic ulcer of other part of right foot with fat layer exposed: Secondary | ICD-10-CM | POA: Diagnosis not present

## 2017-03-23 NOTE — Progress Notes (Signed)
MATHAN, DARROCH ([id number]) Visit Report for 03/21/2017 Arrival Information Details Patient Name: [name], GARDY MONTANARI. Date of Service: 03/21/2017 11:00 AM Medical Record Number: [id number] Patient Account Number: [id number] Date of Birth/Sex: Jul 13, 1950 (66 y.o. Male) Treating RN: Montey Hora Primary Care Jlee Harkless: Myrtie Hawk Other Clinician: Referring Quade Ramirez: Myrtie Hawk Treating Stevie Charter/Extender: Frann Rider in Treatment: 25 Visit Information History Since Last Visit Added or deleted any medications: No Patient Arrived: Ambulatory Any new allergies or adverse reactions: No Arrival Time: 11:01 Had a fall or experienced change in No Accompanied By: brother activities of daily living that may affect Transfer Assistance: None risk of falls: Patient Identification Verified: Yes Signs or symptoms of abuse/neglect since last visito No Secondary Verification Process Completed: Yes Hospitalized since last visit: No Patient Requires Transmission-Based No Has Dressing in Place as Prescribed: Yes Precautions: Pain Present Now: No Patient Has Alerts: Yes Patient Alerts: DMII Electronic Signature(s) Signed: 03/21/2017 4:52:42 PM By: Montey Hora Entered By: Montey Hora on 03/21/2017 11:01:55 Gatta, [name] ([id number]) -------------------------------------------------------------------------------- Clinic Level of Care Assessment Details Patient Name: [name], HOPPING E. Date of Service: 03/21/2017 11:00 AM Medical Record Number: [id number] Patient Account Number: [id number] Date of Birth/Sex: [birth year] (66 y.o. Male) Treating RN: Montey Hora Primary Care Nahdia Doucet: Myrtie Hawk Other Clinician: Referring Padraic [name]: Myrtie Hawk Treating Tarin Johndrow/Extender: Frann Rider in Treatment: 25 Clinic Level of Care Assessment Items TOOL 4 Quantity Score []  - Use when only an EandM is performed on FOLLOW-UP visit 0 ASSESSMENTS -  Nursing Assessment / Reassessment X - Reassessment of Co-morbidities (includes updates in patient status) 1 10 X- 1 5 Reassessment of Adherence to Treatment Plan ASSESSMENTS - Wound and Skin Assessment / Reassessment []  - Simple Wound Assessment / Reassessment - one wound 0 X- 6 5 Complex Wound Assessment / Reassessment - multiple wounds []  - 0 Dermatologic / Skin Assessment (not related to wound area) ASSESSMENTS - Focused Assessment []  - Circumferential Edema Measurements - multi extremities 0 []  - 0 Nutritional Assessment / Counseling / Intervention X- 1 5 Lower Extremity Assessment (monofilament, tuning fork, pulses) []  - 0 Peripheral Arterial Disease Assessment (using hand held doppler) ASSESSMENTS - Ostomy and/or Continence Assessment and Care []  - Incontinence Assessment and Management 0 []  - 0 Ostomy Care Assessment and Management (repouching, etc.) PROCESS - Coordination of Care X - Simple Patient / Family Education for ongoing care 1 15 []  - 0 Complex (extensive) Patient / Family Education for ongoing care []  - 0 Staff obtains Programmer, systems, Records, Test Results / Process Orders []  - 0 Staff telephones HHA, Nursing Homes / Clarify orders / etc []  - 0 Routine Transfer to another Facility (non-emergent condition) []  - 0 Routine Hospital Admission (non-emergent condition) []  - 0 New Admissions / Biomedical engineer / Ordering NPWT, Apligraf, etc. []  - 0 Emergency Hospital Admission (emergent condition) X- 1 10 Simple Discharge Coordination Gil, Mukhtar E. ([id number]) []  - 0 Complex (extensive) Discharge Coordination PROCESS - Special Needs []  - Pediatric / Minor Patient Management 0 []  - 0 Isolation Patient Management []  - 0 Hearing / Language / Visual special needs []  - 0 Assessment of Community assistance (transportation, D/C planning, etc.) []  - 0 Additional assistance / Altered mentation []  - 0 Support Surface(s) Assessment (bed, cushion, seat,  etc.) INTERVENTIONS - Wound Cleansing / Measurement []  - Simple Wound Cleansing - one wound 0 X- 6 5 Complex Wound Cleansing - multiple wounds X- 1 5 Wound Imaging (photographs - any number of wounds) []  - 0 Wound Tracing (  instead of photographs) []  - 0 Simple Wound Measurement - one wound X- 6 5 Complex Wound Measurement - multiple wounds INTERVENTIONS - Wound Dressings X - Small Wound Dressing one or multiple wounds 6 10 []  - 0 Medium Wound Dressing one or multiple wounds []  - 0 Large Wound Dressing one or multiple wounds []  - 0 Application of Medications - topical []  - 0 Application of Medications - injection INTERVENTIONS - Miscellaneous []  - External ear exam 0 []  - 0 Specimen Collection (cultures, biopsies, blood, body fluids, etc.) []  - 0 Specimen(s) / Culture(s) sent or taken to Lab for analysis []  - 0 Patient Transfer (multiple staff / Civil Service fast streamer / Similar devices) []  - 0 Simple Staple / Suture removal (25 or less) []  - 0 Complex Staple / Suture removal (26 or more) []  - 0 Hypo / Hyperglycemic Management (close monitor of Blood Glucose) []  - 0 Ankle / Brachial Index (ABI) - do not check if billed separately X- 1 5 Vital Signs Branan, Harutyun E. ([id number]) Has the patient been seen at the hospital within the last three years: Yes Total Score: 205 Level Of Care: New/Established - Level 5 Electronic Signature(s) Signed: 03/21/2017 4:52:42 PM By: Montey Hora Entered By: Montey Hora on 03/21/2017 13:11:32 Saavedra, [name] ([id number]) -------------------------------------------------------------------------------- Encounter Discharge Information Details Patient Name: [name], [name] E. Date of Service: 03/21/2017 11:00 AM Medical Record Number: [id number] Patient Account Number: [id number] Date of Birth/Sex: 05/23/1950 (66 y.o. Male) Treating RN: Montey Hora Primary Care Lashaun Krapf: Myrtie Hawk Other Clinician: Referring Pachia Strum: Myrtie Hawk Treating Lanissa Cashen/Extender: Frann Rider in Treatment: 25 Encounter Discharge Information Items Discharge Pain Level: 0 Discharge Condition: Stable Ambulatory Status: Ambulatory Discharge Destination: Home Transportation: Private Auto Accompanied By: brother Schedule Follow-up Appointment: Yes Medication Reconciliation completed and No provided to Patient/Care Nachmen Mansel: Provided on Clinical Summary of Care: 03/21/2017 Form Type Recipient Paper Patient ED Electronic Signature(s) Signed: 03/21/2017 4:52:42 PM By: Montey Hora Entered By: Montey Hora on 03/21/2017 13:12:34 Krolak, [name] ([id number]) -------------------------------------------------------------------------------- Lower Extremity Assessment Details Patient Name: [name], [name] E. Date of Service: 03/21/2017 11:00 AM Medical Record Number: [id number] Patient Account Number: [id number] Date of Birth/Sex: [birth year] (66 y.o. Male) Treating RN: Montey Hora Primary Care Roseanna Koplin: Myrtie Hawk Other Clinician: Referring Richardine Peppers: Myrtie Hawk Treating Mafalda Mcginniss/Extender: Frann Rider in Treatment: 25 Vascular Assessment Pulses: Dorsalis Pedis Palpable: [Left:Yes] [Right:Yes] Posterior Tibial Extremity colors, hair growth, and conditions: Extremity Color: [Left:Hyperpigmented] [Right:Hyperpigmented] Hair Growth on Extremity: [Left:No] [Right:No] Temperature of Extremity: [Left:Warm] [Right:Warm] Capillary Refill: [Left:< 3 seconds] [Right:< 3 seconds] Electronic Signature(s) Signed: 03/21/2017 4:52:42 PM By: Montey Hora Entered By: Montey Hora on 03/21/2017 11:19:03 Catlin, Khari E. ([id number]) -------------------------------------------------------------------------------- Multi Wound Chart Details Patient Name: Bowens, Kurt E. Date of Service: 03/21/2017 11:00 AM Medical Record Number: [id number] Patient Account Number: [id number] Date of Birth/Sex: 1950/07/10  (66 y.o. Male) Treating RN: Montey Hora Primary Care Jabria Loos: Myrtie Hawk Other Clinician: Referring Dekayla Prestridge: Myrtie Hawk Treating Keah Lamba/Extender: Frann Rider in Treatment: 25 Vital Signs Height(in): 63 Pulse(bpm): 36 Weight(lbs): 168 Blood Pressure(mmHg): 165/67 Body Mass Index(BMI): 25 Temperature(F): 98.0 Respiratory Rate 18 (breaths/min): Photos: [1:No Photos] [10:No Photos] [2:No Photos] Wound Location: [1:Right Toe Great] [10:Right Toe Fourth] [2:Right Toe Second] Wounding Event: [1:Gradually Appeared] [10:Not Known] [2:Gradually Appeared] Primary Etiology: [1:Diabetic Wound/Ulcer of the Lower Extremity] [10:Diabetic Wound/Ulcer of the Lower Extremity] [2:Diabetic Wound/Ulcer of the Lower Extremity] Comorbid History: [1:Anemia, Lymphedema, Congestive Heart Failure, Hypertension, Peripheral Venous Disease, Type II Diabetes, Neuropathy] [10:N/A] [2:Anemia, Lymphedema, Congestive Heart Failure,  Hypertension, Peripheral Venous Disease, Type II Diabetes,  Neuropathy] Date Acquired: [1:06/11/2016] [10:01/28/2017] [2:06/11/2016] Weeks of Treatment: [1:25] [10:7] [2:25] Wound Status: [1:Open] [10:Healed - Epithelialized] [2:Open] Pending Amputation on [1:Yes] [10:No] [2:Yes] Presentation: Measurements L x W x D [1:1.7x1.8x0.1] [10:0x0x0] [2:1.3x1.6x0.1] (cm) Area (cm) : [1:2.403] [10:0] [2:1.634] Volume (cm) : [1:0.24] [10:0] [2:0.163] % Reduction in Area: [1:94.90%] [10:100.00%] [2:46.80%] % Reduction in Volume: [1:94.90%] [10:100.00%] [2:46.90%] Classification: [1:Grade 1] [10:Grade 2] [2:Grade 1] Exudate Amount: [1:Large] [10:N/A] [2:Large] Exudate Type: [1:Serosanguineous] [10:N/A] [2:Serosanguineous] Exudate Color: [1:red, brown] [10:N/A] [2:red, brown] Foul Odor After Cleansing: [1:No] [10:N/A] [2:No] Odor Anticipated Due to [1:N/A] [10:N/A] [2:N/A] Product Use: Wound Margin: [1:Flat and Intact] [10:N/A] [2:Flat and  Intact] Granulation Amount: [1:Medium (34-66%)] [10:N/A] [2:Medium (34-66%)] Granulation Quality: [1:Pink] [10:N/A] [2:Pink] Necrotic Amount: [1:Medium (34-66%)] [10:N/A] [2:Medium (34-66%)] Exposed Structures: [1:Fat Layer (Subcutaneous Tissue) Exposed: Yes Fascia: No Tendon: No Muscle: No] [10:N/A] [2:Fat Layer (Subcutaneous Tissue) Exposed: Yes Fascia: No Tendon: No Muscle: No] Joint: No Joint: No Bone: No Bone: No Epithelialization: Small (1-33%) N/A Small (1-33%) Periwound Skin Texture: Excoriation: Yes No Abnormalities Noted Excoriation: Yes Induration: No Induration: No Callus: No Callus: No Crepitus: No Crepitus: No Rash: No Rash: No Scarring: No Scarring: No Periwound Skin Moisture: Maceration: Yes No Abnormalities Noted Maceration: Yes Dry/Scaly: No Dry/Scaly: No Periwound Skin Color: Atrophie Blanche: No No Abnormalities Noted Atrophie Blanche: No Cyanosis: No Cyanosis: No Ecchymosis: No Ecchymosis: No Erythema: No Erythema: No Hemosiderin Staining: No Hemosiderin Staining: No Mottled: No Mottled: No Pallor: No Pallor: No Rubor: No Rubor: No Temperature: No Abnormality N/A No Abnormality Tenderness on Palpation: No No No Wound Preparation: Ulcer Cleansing: N/A Ulcer Cleansing: Rinsed/Irrigated with Saline Rinsed/Irrigated with Saline Topical Anesthetic Applied: None Wound Number: [id number]  Photos: No Photos No Photos No Photos Wound Location: Left Toe Great Left Toe Second Left Toe Third Wounding Event: Gradually Appeared Gradually Appeared Gradually Appeared Primary Etiology: Diabetic Wound/Ulcer of 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 Lower Extremity Lower Extremity Comorbid History: Anemia, Lymphedema, Anemia, Lymphedema, Anemia, Lymphedema, Congestive Heart Failure, Congestive Heart Failure, Congestive Heart Failure, Hypertension, Peripheral Hypertension, Peripheral Hypertension, Peripheral Venous Disease,  Type II Venous Disease, Type II Venous Disease, Type II Diabetes, Neuropathy Diabetes, Neuropathy Diabetes, Neuropathy Date Acquired: 06/11/2016 06/11/2016 12/30/2016 Weeks of Treatment: 25 25 11  Wound Status: Open Open Open Pending Amputation on Yes Yes No Presentation: Measurements L x W x D 0.1x0.1x0.1 2x1.5x0.1 0.1x0.1x0.1 (cm) Area (cm) : 0.008 2.356 0.008 Volume (cm) : 0.001 0.236 0.001 % Reduction in Area: 99.90% -10.30% 99.00% % Reduction in Volume: 99.90% -10.30% 98.80% Classification: Grade 1 Grade 1 Grade 1 Exudate Amount: Large Large Large Exudate Type: Serous Serous Serosanguineous Exudate Color: amber amber red, brown Foul Odor After Cleansing: Yes No No Odor Anticipated Due to No N/A N/A Product Use: Wound Margin: Flat and Intact Flat and Intact Distinct, outline attached Granulation Amount: Medium (34-66%) Medium (34-66%) Medium (34-66%) Kersten, Advit E. ([id number]) Granulation Quality: Pink Pink Red Necrotic Amount: Medium (34-66%) Medium (34-66%) Medium (34-66%) Exposed Structures: Fat Layer (Subcutaneous Fat Layer (Subcutaneous N/A Tissue) Exposed: Yes Tissue) Exposed: Yes Fascia: No Fascia: No Tendon: No Tendon: No Muscle: No Muscle: No Joint: No Joint: No Bone: No Bone: No Epithelialization: Small (1-33%) Small (1-33%) None Periwound Skin Texture: Excoriation: Yes Excoriation: Yes No Abnormalities Noted Induration: No Induration: No Callus: No Callus: No Crepitus: No Crepitus: No Rash: No Rash: No Scarring: No Scarring: No Periwound Skin Moisture: Maceration: Yes Maceration: Yes  Maceration: Yes Dry/Scaly: No Dry/Scaly: No Periwound Skin Color: Atrophie Blanche: No Atrophie Blanche: No No Abnormalities Noted Cyanosis: No Cyanosis: No Ecchymosis: No Ecchymosis: No Erythema: No Erythema: No Hemosiderin Staining: No Hemosiderin Staining: No Mottled: No Mottled: No Pallor: No Pallor: No Rubor: No Rubor: No Temperature: No  Abnormality No Abnormality No Abnormality Tenderness on Palpation: No No Yes Wound Preparation: Ulcer Cleansing: Ulcer Cleansing: Ulcer Cleansing: Rinsed/Irrigated with Saline Rinsed/Irrigated with Saline Rinsed/Irrigated with Saline Wound Number: 9 N/A N/A Photos: No Photos N/A N/A Wound Location: Right Toe Third N/A N/A Wounding Event: Gradually Appeared N/A N/A Primary Etiology: Diabetic Wound/Ulcer of the N/A N/A Lower Extremity Comorbid History: Anemia, Lymphedema, N/A N/A Congestive Heart Failure, Hypertension, Peripheral Venous Disease, Type II Diabetes, Neuropathy Date Acquired: 12/31/2016 N/A N/A Weeks of Treatment: 11 N/A N/A Wound Status: Open N/A N/A Pending Amputation on No N/A N/A Presentation: Measurements L x W x D 1.1x1x0.1 N/A N/A (cm) Area (cm) : 0.864 N/A N/A Volume (cm) : 0.086 N/A N/A % Reduction in Area: -119.80% N/A N/A % Reduction in Volume: -120.50% N/A N/A Classification: Grade 1 N/A N/A Exudate Amount: Large N/A N/A Exudate Type: Serous N/A N/A Exudate Color: amber N/A N/A Foul Odor After Cleansing: Yes N/A N/A Lagace, Kiven E. ([id number]) Odor Anticipated Due to No N/A N/A Product Use: Wound Margin: Flat and Intact N/A N/A Granulation Amount: Medium (34-66%) N/A N/A Granulation Quality: Pink N/A N/A Necrotic Amount: Medium (34-66%) N/A N/A Exposed Structures: Fascia: No N/A N/A Fat Layer (Subcutaneous Tissue) Exposed: No Tendon: No Muscle: No Joint: No Bone: No Epithelialization: Medium (34-66%) N/A N/A Periwound Skin Texture: Excoriation: No N/A N/A Induration: No Callus: No Crepitus: No Rash: No Scarring: No Periwound Skin Moisture: Maceration: Yes N/A N/A Dry/Scaly: No Periwound Skin Color: Atrophie Blanche: No N/A N/A Cyanosis: No Ecchymosis: No Erythema: No Hemosiderin Staining: No Mottled: No Pallor: No Rubor: No Temperature: No Abnormality N/A N/A Tenderness on Palpation: No N/A N/A Wound Preparation: Ulcer  Cleansing: N/A N/A Rinsed/Irrigated with Saline Treatment Notes Electronic Signature(s) Signed: 03/21/2017 11:24:04 AM By: Christin Fudge MD, FACS Entered By: Christin Fudge on 03/21/2017 11:24:04 Crouse, [name] ([id number]) -------------------------------------------------------------------------------- [place] Details Patient Name: Kollmann, Brittan E. Date of Service: 03/21/2017 11:00 AM Medical Record Number: [id number] Patient Account Number: [id number] Date of Birth/Sex: October 15, 1950 (66 y.o. Male) Treating RN: Montey Hora Primary Care Laramie Gelles: Myrtie Hawk Other Clinician: Referring Ayen Viviano: Myrtie Hawk Treating Holly Pring/Extender: Frann Rider in Treatment: 25 Active Inactive ` Abuse / Safety / Falls / Self Care Management Nursing Diagnoses: Potential for falls Goals: Patient will remain injury free related to falls Date Initiated: 09/21/2016 Target Resolution Date: 11/30/2016 Goal Status: Active Interventions: Assess fall risk on admission and as needed Notes: ` Nutrition Nursing Diagnoses: Potential for alteratiion in Nutrition/Potential for imbalanced nutrition Goals: Patient/caregiver agrees to and verbalizes understanding of need to use nutritional supplements and/or vitamins as prescribed Date Initiated: 09/21/2016 Target Resolution Date: 11/30/2016 Goal Status: Active Interventions: Assess patient nutrition upon admission and as needed per policy Notes: ` Orientation to the Wound Care Program Nursing Diagnoses: Knowledge deficit related to the wound healing center program Goals: Patient/caregiver will verbalize understanding of the [facility] Date Initiated: 09/21/2016 Target Resolution Date: 11/30/2016 Goal Status: Active Interventions: ISHAAN, VILLAMAR ([id number]) Provide education on orientation to the wound center Notes: ` Wound/Skin Impairment Nursing Diagnoses: Knowledge deficit  related to smoking impact on wound healing Goals: Ulcer/skin breakdown will have a volume reduction of 30%  by week 4 Date Initiated: 09/21/2016 Target Resolution Date: 11/30/2016 Goal Status: Active Ulcer/skin breakdown will have a volume reduction of 50% by week 8 Date Initiated: 09/21/2016 Target Resolution Date: 11/30/2016 Goal Status: Active Ulcer/skin breakdown will have a volume reduction of 80% by week 12 Date Initiated: 09/21/2016 Target Resolution Date: 11/30/2016 Goal Status: Active Ulcer/skin breakdown will heal within 14 weeks Date Initiated: 09/21/2016 Target Resolution Date: 11/30/2016 Goal Status: Active Interventions: Assess patient/caregiver ability to obtain necessary supplies Assess patient/caregiver ability to perform ulcer/skin care regimen upon admission and as needed Assess ulceration(s) every visit Notes: Electronic Signature(s) Signed: 03/21/2017 4:52:42 PM By: Montey Hora Entered By: Montey Hora on 03/21/2017 11:19:11 Taborn, [name] ([id number]) -------------------------------------------------------------------------------- Pain Assessment Details Patient Name: [name], [name] E. Date of Service: 03/21/2017 11:00 AM Medical Record Number: [id number] Patient Account Number: [id number] Date of Birth/Sex: [birth year] (66 y.o. Male) Treating RN: Montey Hora Primary Care Devere Brem: Myrtie Hawk Other Clinician: Referring Alonza Knisley: Myrtie Hawk Treating Lexia Vandevender/Extender: Frann Rider in Treatment: 25 Active Problems Location of Pain Severity and Description of Pain Patient Has Paino No Site Locations Pain Management and Medication Current Pain Management: Electronic Signature(s) Signed: 03/21/2017 4:52:42 PM By: Montey Hora Entered By: Montey Hora on 03/21/2017 11:02:04 Cubillos, [name] ([id number]) -------------------------------------------------------------------------------- Patient/Caregiver Education  Details Patient Name: Shiveley, Nachum E. Date of Service: 03/21/2017 11:00 AM Medical Record Number: [id number] Patient Account Number: [id number] Date of Birth/Gender: 1951-02-08 (66 y.o. Male) Treating RN: Montey Hora Primary Care Physician: Myrtie Hawk Other Clinician: Referring Physician: Myrtie Hawk Treating Physician/Extender: Frann Rider in Treatment: 25 Education Assessment Education Provided To: Patient Education Topics Provided Wound/Skin Impairment: Handouts: Other: wound care as ordered Methods: Demonstration, Explain/Verbal Responses: State content correctly Electronic Signature(s) Signed: 03/21/2017 4:52:42 PM By: Montey Hora Entered By: Montey Hora on 03/21/2017 13:13:06 Rivkin, [name] ([id number]) -------------------------------------------------------------------------------- Wound Assessment Details Patient Name: [name], [name] E. Date of Service: 03/21/2017 11:00 AM Medical Record Number: [id number] Patient Account Number: [id number] Date of Birth/Sex: 10/14/1950 (66 y.o. Male) Treating RN: Montey Hora Primary Care Lizette Pazos: Myrtie Hawk Other Clinician: Referring Margaurite Salido: Myrtie Hawk Treating Peja Allender/Extender: Frann Rider in Treatment: 25 Wound Status Wound Number: 1 Primary Diabetic Wound/Ulcer of the Lower Extremity Etiology: Wound Location: Right Toe Great Wound Open Wounding Event: Gradually Appeared Status: Date Acquired: 06/11/2016 Comorbid Anemia, Lymphedema, Congestive Heart Weeks Of Treatment: 25 History: Failure, Hypertension, Peripheral Venous Clustered Wound: No Disease, Type II Diabetes, Neuropathy Pending Amputation On Presentation Wound Measurements Length: (cm) 1.7 Width: (cm) 1.8 Depth: (cm) 0.1 Area: (cm) 2.403 Volume: (cm) 0.24 % Reduction in Area: 94.9% % Reduction in Volume: 94.9% Epithelialization: Small (1-33%) Tunneling: No Undermining: No Wound  Description Classification: Grade 1 Foul Odo Wound Margin: Flat and Intact Slough/F Exudate Amount: Large Exudate Type: Serosanguineous Exudate Color: red, brown r After Cleansing: No ibrino Yes Wound Bed Granulation Amount: Medium (34-66%) Exposed Structure Granulation Quality: Pink Fascia Exposed: No Necrotic Amount: Medium (34-66%) Fat Layer (Subcutaneous Tissue) Exposed: Yes Necrotic Quality: Adherent Slough Tendon Exposed: No Muscle Exposed: No Joint Exposed: No Bone Exposed: No Periwound Skin Texture Texture Color No Abnormalities Noted: No No Abnormalities Noted: No Callus: No Atrophie Blanche: No Crepitus: No Cyanosis: No Excoriation: Yes Ecchymosis: No Induration: No Erythema: No Rash: No Hemosiderin Staining: No Scarring: No Mottled: No Pallor: No Moisture Rubor: No No Abnormalities Noted: No Dry / Scaly: No Temperature / Pain Maceration: Yes Temperature: No Abnormality Slavick, Jaleen E. ([id number]) Wound Preparation Ulcer Cleansing: Rinsed/Irrigated with Saline Topical Anesthetic Applied: None  Treatment Notes Wound #1 (Right Toe Great) 1. Cleansed with: Clean wound with Normal Saline 4. Dressing Applied: Aquacel Ag 5. Secondary Dressing Applied Dry Gauze Kerlix/Conform Notes Nystatin cream, Sivercell, ABD, conform and stretch net Electronic Signature(s) Signed: 03/21/2017 11:13:01 AM By: Montey Hora Entered By: Montey Hora on 03/21/2017 11:13:00 Malcomb, [name] ([id number]) -------------------------------------------------------------------------------- Wound Assessment Details Patient Name: [name], [name] E. Date of Service: 03/21/2017 11:00 AM Medical Record Number: [id number] Patient Account Number: [id number] Date of Birth/Sex: 1950/10/31 (66 y.o. Male) Treating RN: Montey Hora Primary Care Jereld Presti: Myrtie Hawk Other Clinician: Referring Kianna Billet: Myrtie Hawk Treating Tinsleigh Slovacek/Extender: Frann Rider  in Treatment: 25 Wound Status Wound Number: 10 Primary Diabetic Wound/Ulcer of the Lower Etiology: Extremity Wound Location: Right Toe Fourth Wound Status: Healed - Epithelialized Wounding Event: Not Known Date Acquired: 01/28/2017 Weeks Of Treatment: 7 Clustered Wound: No Wound Measurements Length: (cm) 0 % Width: (cm) 0 % Depth: (cm) 0 Area: (cm) 0 Volume: (cm) 0 Reduction in Area: 100% Reduction in Volume: 100% Wound Description Classification: Grade 2 Periwound Skin Texture Texture Color No Abnormalities Noted: No No Abnormalities Noted: No Moisture No Abnormalities Noted: No Electronic Signature(s) Signed: 03/21/2017 4:52:42 PM By: Montey Hora Entered By: Montey Hora on 03/21/2017 11:11:33 Archambault, [name] ([id number]) -------------------------------------------------------------------------------- Wound Assessment Details Patient Name: [name], [name] E. Date of Service: 03/21/2017 11:00 AM Medical Record Number: [id number] Patient Account Number: [id number] Date of Birth/Sex: March 05, 1951 (66 y.o. Male) Treating RN: Montey Hora Primary Care Demetria Lightsey: Myrtie Hawk Other Clinician: Referring Yarenis Cerino: Myrtie Hawk Treating Jenine Krisher/Extender: Frann Rider in Treatment: 25 Wound Status Wound Number: 2 Primary Diabetic Wound/Ulcer of the Lower Extremity Etiology: Wound Location: Right Toe Second Wound Open Wounding Event: Gradually Appeared Status: Date Acquired: 06/11/2016 Comorbid Anemia, Lymphedema, Congestive Heart Weeks Of Treatment: 25 History: Failure, Hypertension, Peripheral Venous Clustered Wound: No Disease, Type II Diabetes, Neuropathy Pending Amputation On Presentation Wound Measurements Length: (cm) 1.3 Width: (cm) 1.6 Depth: (cm) 0.1 Area: (cm) 1.634 Volume: (cm) 0.163 % Reduction in Area: 46.8% % Reduction in Volume: 46.9% Epithelialization: Small (1-33%) Tunneling: No Undermining: No Wound  Description Classification: Grade 1 Foul Odo Wound Margin: Flat and Intact Slough/F Exudate Amount: Large Exudate Type: Serosanguineous Exudate Color: red, brown r After Cleansing: No ibrino Yes Wound Bed Granulation Amount: Medium (34-66%) Exposed Structure Granulation Quality: Pink Fascia Exposed: No Necrotic Amount: Medium (34-66%) Fat Layer (Subcutaneous Tissue) Exposed: Yes Necrotic Quality: Adherent Slough Tendon Exposed: No Muscle Exposed: No Joint Exposed: No Bone Exposed: No Periwound Skin Texture Texture Color No Abnormalities Noted: No No Abnormalities Noted: No Callus: No Atrophie Blanche: No Crepitus: No Cyanosis: No Excoriation: Yes Ecchymosis: No Induration: No Erythema: No Rash: No Hemosiderin Staining: No Scarring: No Mottled: No Pallor: No Moisture Rubor: No No Abnormalities Noted: No Dry / Scaly: No Temperature / Pain Maceration: Yes Temperature: No Abnormality [name], Deloyd E. ([id number]) Wound Preparation Ulcer Cleansing: Rinsed/Irrigated with Saline Treatment Notes Wound #2 (Right Toe Second) 1. Cleansed with: Clean wound with Normal Saline 4. Dressing Applied: Aquacel Ag 5. Secondary Dressing Applied Dry Gauze Kerlix/Conform Notes Nystatin cream, Sivercell, ABD, conform and stretch net Electronic Signature(s) Signed: 03/21/2017 11:13:19 AM By: Montey Hora Entered By: Montey Hora on 03/21/2017 11:13:19 Lanza, [name] ([id number]) -------------------------------------------------------------------------------- Wound Assessment Details Patient Name: Siebel, Challen E. Date of Service: 03/21/2017 11:00 AM Medical Record Number: [id number] Patient Account Number: [id number] Date of Birth/Sex: 01/23/1951 (66 y.o. Male) Treating RN: Montey Hora Primary Care Cherylann Hobday: Myrtie Hawk Other Clinician: Referring Taleen Prosser: Myrtie Hawk  Treating Yennifer Segovia/Extender: Frann Rider in Treatment: 25 Wound  Status Wound Number: 5 Primary Diabetic Wound/Ulcer of the Lower Extremity Etiology: Wound Location: Left Toe Great Wound Open Wounding Event: Gradually Appeared Status: Date Acquired: 06/11/2016 Comorbid Anemia, Lymphedema, Congestive Heart Weeks Of Treatment: 25 History: Failure, Hypertension, Peripheral Venous Clustered Wound: No Disease, Type II Diabetes, Neuropathy Pending Amputation On Presentation Wound Measurements Length: (cm) 0.1 Width: (cm) 0.1 Depth: (cm) 0.1 Area: (cm) 0.008 Volume: (cm) 0.001 % Reduction in Area: 99.9% % Reduction in Volume: 99.9% Epithelialization: Small (1-33%) Tunneling: No Undermining: No Wound Description Classification: Grade 1 Foul Odo Wound Margin: Flat and Intact Due to P Exudate Amount: Large Slough/F Exudate Type: Serous Exudate Color: amber r After Cleansing: Yes roduct Use: No ibrino Yes Wound Bed Granulation Amount: Medium (34-66%) Exposed Structure Granulation Quality: Pink Fascia Exposed: No Necrotic Amount: Medium (34-66%) Fat Layer (Subcutaneous Tissue) Exposed: Yes Necrotic Quality: Adherent Slough Tendon Exposed: No Muscle Exposed: No Joint Exposed: No Bone Exposed: No Periwound Skin Texture Texture Color No Abnormalities Noted: No No Abnormalities Noted: No Callus: No Atrophie Blanche: No Crepitus: No Cyanosis: No Excoriation: Yes Ecchymosis: No Induration: No Erythema: No Rash: No Hemosiderin Staining: No Scarring: No Mottled: No Pallor: No Moisture Rubor: No No Abnormalities Noted: No Dry / Scaly: No Temperature / Pain Maceration: Yes Temperature: No Abnormality Bekele, Sheikh E. ([id number]) Wound Preparation Ulcer Cleansing: Rinsed/Irrigated with Saline Treatment Notes Wound #5 (Left Toe Great) 1. Cleansed with: Clean wound with Normal Saline 4. Dressing Applied: Aquacel Ag 5. Secondary Dressing Applied Dry Gauze Kerlix/Conform Notes Nystatin cream, Sivercell, ABD, conform and  stretch net Electronic Signature(s) Signed: 03/21/2017 11:13:36 AM By: Montey Hora Entered By: Montey Hora on 03/21/2017 11:13:35 Gautier, [name] ([id number]) -------------------------------------------------------------------------------- Wound Assessment Details Patient Name: Ellerbrock, Zaidin E. Date of Service: 03/21/2017 11:00 AM Medical Record Number: [id number] Patient Account Number: [id number] Date of Birth/Sex: 08/14/1950 (66 y.o. Male) Treating RN: Montey Hora Primary Care Sister Carbone: Myrtie Hawk Other Clinician: Referring Raylee Adamec: Myrtie Hawk Treating Lesly Pontarelli/Extender: Frann Rider in Treatment: 25 Wound Status Wound Number: 6 Primary Diabetic Wound/Ulcer of the Lower Extremity Etiology: Wound Location: Left Toe Second Wound Open Wounding Event: Gradually Appeared Status: Date Acquired: 06/11/2016 Comorbid Anemia, Lymphedema, Congestive Heart Weeks Of Treatment: 25 History: Failure, Hypertension, Peripheral Venous Clustered Wound: No Disease, Type II Diabetes, Neuropathy Pending Amputation On Presentation Wound Measurements Length: (cm) 2 Width: (cm) 1.5 Depth: (cm) 0.1 Area: (cm) 2.356 Volume: (cm) 0.236 % Reduction in Area: -10.3% % Reduction in Volume: -10.3% Epithelialization: Small (1-33%) Tunneling: No Undermining: No Wound Description Classification: Grade 1 Foul Odo Wound Margin: Flat and Intact Slough/F Exudate Amount: Large Exudate Type: Serous Exudate Color: amber r After Cleansing: No ibrino Yes Wound Bed Granulation Amount: Medium (34-66%) Exposed Structure Granulation Quality: Pink Fascia Exposed: No Necrotic Amount: Medium (34-66%) Fat Layer (Subcutaneous Tissue) Exposed: Yes Necrotic Quality: Adherent Slough Tendon Exposed: No Muscle Exposed: No Joint Exposed: No Bone Exposed: No Periwound Skin Texture Texture Color No Abnormalities Noted: No No Abnormalities Noted: No Callus: No Atrophie  Blanche: No Crepitus: No Cyanosis: No Excoriation: Yes Ecchymosis: No Induration: No Erythema: No Rash: No Hemosiderin Staining: No Scarring: No Mottled: No Pallor: No Moisture Rubor: No No Abnormalities Noted: No Dry / Scaly: No Temperature / Pain Maceration: Yes Temperature: No Abnormality Deupree, Marquet E. ([id number]) Wound Preparation Ulcer Cleansing: Rinsed/Irrigated with Saline Treatment Notes Wound #6 (Left Toe Second) 1. Cleansed with: Clean wound with Normal Saline 4. Dressing Applied: Aquacel Ag  5. Secondary Dressing Applied Dry Gauze Kerlix/Conform Notes Nystatin cream, Sivercell, ABD, conform and stretch net Electronic Signature(s) Signed: 03/21/2017 11:13:52 AM By: Montey Hora Entered By: Montey Hora on 03/21/2017 11:13:51 Rolph, [name] ([id number]) -------------------------------------------------------------------------------- Wound Assessment Details Patient Name: Roettger, Marie E. Date of Service: 03/21/2017 11:00 AM Medical Record Number: [id number] Patient Account Number: [id number] Date of Birth/Sex: 23-Nov-1950 (66 y.o. Male) Treating RN: Montey Hora Primary Care Maximina Pirozzi: Myrtie Hawk Other Clinician: Referring Aslyn Cottman: Myrtie Hawk Treating Rosevelt Luu/Extender: Frann Rider in Treatment: 25 Wound Status Wound Number: 8 Primary Diabetic Wound/Ulcer of the Lower Extremity Etiology: Wound Location: Left Toe Third Wound Open Wounding Event: Gradually Appeared Status: Date Acquired: 12/30/2016 Comorbid Anemia, Lymphedema, Congestive Heart Weeks Of Treatment: 11 History: Failure, Hypertension, Peripheral Venous Clustered Wound: No Disease, Type II Diabetes, Neuropathy Wound Measurements Length: (cm) 0.1 Width: (cm) 0.1 Depth: (cm) 0.1 Area: (cm) 0.008 Volume: (cm) 0.001 % Reduction in Area: 99% % Reduction in Volume: 98.8% Epithelialization: None Tunneling: No Undermining: No Wound  Description Classification: Grade 1 Wound Margin: Distinct, outline attached Exudate Amount: Large Exudate Type: Serosanguineous Exudate Color: red, brown Foul Odor After Cleansing: No Slough/Fibrino Yes Wound Bed Granulation Amount: Medium (34-66%) Granulation Quality: Red Necrotic Amount: Medium (34-66%) Necrotic Quality: Adherent Slough Periwound Skin Texture Texture Color No Abnormalities Noted: No No Abnormalities Noted: No Moisture Temperature / Pain No Abnormalities Noted: No Temperature: No Abnormality Maceration: Yes Tenderness on Palpation: Yes Wound Preparation Ulcer Cleansing: Rinsed/Irrigated with Saline Treatment Notes Wound #8 (Left Toe Third) 1. Cleansed with: Clean wound with Normal Saline 4. Dressing Applied: Aquacel Ag Weedman, Jeanluc E. ([id number]) 5. Secondary Dressing Applied Dry Gauze Kerlix/Conform Notes Nystatin cream, Sivercell, ABD, conform and stretch net Electronic Signature(s) Signed: 03/21/2017 11:14:11 AM By: Montey Hora Entered By: Montey Hora on 03/21/2017 11:14:11 Conkle, [name] ([id number]) -------------------------------------------------------------------------------- Wound Assessment Details Patient Name: Alarie, Haile E. Date of Service: 03/21/2017 11:00 AM Medical Record Number: [id number] Patient Account Number: [id number] Date of Birth/Sex: [birth year] (66 y.o. Male) Treating RN: Montey Hora Primary Care Damond Borchers: Myrtie Hawk Other Clinician: Referring Deztinee Lohmeyer: Myrtie Hawk Treating Jeniece Hannis/Extender: Frann Rider in Treatment: 25 Wound Status Wound Number: 9 Primary Diabetic Wound/Ulcer of the Lower Extremity Etiology: Wound Location: Right Toe Third Wound Open Wounding Event: Gradually Appeared Status: Date Acquired: 12/31/2016 Comorbid Anemia, Lymphedema, Congestive Heart Weeks Of Treatment: 11 History: Failure, Hypertension, Peripheral Venous Clustered Wound: No Disease, Type II  Diabetes, Neuropathy Wound Measurements Length: (cm) 1.1 Width: (cm) 1 Depth: (cm) 0.1 Area: (cm) 0.864 Volume: (cm) 0.086 % Reduction in Area: -119.8% % Reduction in Volume: -120.5% Epithelialization: Medium (34-66%) Tunneling: No Undermining: No Wound Description Classification: Grade 1 Foul Odo Wound Margin: Flat and Intact Due to P Exudate Amount: Large Slough/F Exudate Type: Serous Exudate Color: amber r After Cleansing: Yes roduct Use: No ibrino Yes Wound Bed Granulation Amount: Medium (34-66%) Exposed Structure Granulation Quality: Pink Fascia Exposed: No Necrotic Amount: Medium (34-66%) Fat Layer (Subcutaneous Tissue) Exposed: No Necrotic Quality: Adherent Slough Tendon Exposed: No Muscle Exposed: No Joint Exposed: No Bone Exposed: No Periwound Skin Texture Texture Color No Abnormalities Noted: No No Abnormalities Noted: No Callus: No Atrophie Blanche: No Crepitus: No Cyanosis: No Excoriation: No Ecchymosis: No Induration: No Erythema: No Rash: No Hemosiderin Staining: No Scarring: No Mottled: No Pallor: No Moisture Rubor: No No Abnormalities Noted: No Dry / Scaly: No Temperature / Pain Maceration: Yes Temperature: No Abnormality Reynolds, Aadyn E. ([id number]) Wound Preparation Ulcer Cleansing: Rinsed/Irrigated with Saline Treatment Notes Wound #  9 (Right Toe Third) 1. Cleansed with: Clean wound with Normal Saline 4. Dressing Applied: Aquacel Ag 5. Secondary Dressing Applied Dry Gauze Kerlix/Conform Notes Nystatin cream, Sivercell, ABD, conform and stretch net Electronic Signature(s) Signed: 03/21/2017 11:14:27 AM By: Montey Hora Entered By: Montey Hora on 03/21/2017 11:14:26 Schack, [name] ([id number]) -------------------------------------------------------------------------------- Vitals Details Patient Name: Zamarron, [name] E. Date of Service: 03/21/2017 11:00 AM Medical Record Number: [id number] Patient Account Number:  [id number] Date of Birth/Sex: 04-Jun-1950 (66 y.o. Male) Treating RN: Montey Hora Primary Care Mariselda Badalamenti: Myrtie Hawk Other Clinician: Referring Ashvik Grundman: Myrtie Hawk Treating Velecia Ovitt/Extender: Frann Rider in Treatment: 25 Vital Signs Time Taken: 11:08 Temperature (F): 98.0 Height (in): 69 Pulse (bpm): 71 Weight (lbs): 168 Respiratory Rate (breaths/min): 18 Body Mass Index (BMI): 24.8 Blood Pressure (mmHg): 165/67 Reference Range: 80 - 120 mg / dl Electronic Signature(s) Signed: 03/21/2017 4:52:42 PM By: Montey Hora Entered By: Montey Hora on 03/21/2017 11:08:47

## 2017-03-23 NOTE — Progress Notes (Signed)
[name] ([id number]) Visit Report for 03/21/2017 Chief Complaint Document Details Patient Name: [name] Date of Service: 03/21/2017 11:00 AM Medical Record Number: [id number] Patient Account Number: [id number] Date of Birth/Sex: [birth year] (66 y.o. Male) Treating RN: Montey Hora Primary Care Provider: Myrtie Hawk Other Clinician: Referring Provider: Myrtie Hawk Treating Provider/Extender: Frann Rider in Treatment: 25 Information Obtained from: Patient Chief Complaint Patients presents for treatment of an open diabetic ulcer to both feet Electronic Signature(s) Signed: 03/21/2017 11:24:14 AM By: Christin Fudge MD, FACS Entered By: Christin Fudge on 03/21/2017 11:24:13 [name] ([id number]) -------------------------------------------------------------------------------- HPI Details Patient Name: [name], [name] Date of Service: 03/21/2017 11:00 AM Medical Record Number: [id number] Patient Account Number: [id number] Date of Birth/Sex: Dec 19, 1950 (66 y.o. Male) Treating RN: Montey Hora Primary Care Provider: Myrtie Hawk Other Clinician: Referring Provider: Myrtie Hawk Treating Provider/Extender: Frann Rider in Treatment: 25 History of Present Illness Location: bilateral feet ulceration on the toes Quality: Patient reports experiencing a dull pain to affected area(s). Severity: Patient states wound are getting better Duration: Patient has had the wound for > 3 months prior to seeking treatment at the wound center Timing: Pain in wound is constant (hurts all the time) Context: The wound would happen gradually Modifying Factors: Other treatment(s) tried include:treatment for lymphedema and is seen by the podiatrist Dr. Caryl Comes Associated Signs and Symptoms: Patient reports having increase swelling. HPI Description: 66 year old patient here to see as for bilateral feet ulceration to on his left first and  second toe and 2 on his right first and second toe, which she's had for about 4 months. 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 Donella Stade. The patient is also undergoing workup by medical oncology for a intra-abdominal lymphadenopathy. 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 He is also status post appendectomy, inguinal lymph node biopsy, rectal biopsy and rectal examination under anesthesia. he currently smokes cigarettes about half packet a day. In March of this year he was seen by Dr. Hortencia Pilar, for evaluation of bilateral varicose veins and besides wearing compression stockings he had recommended laser ablation of the right and left great saphenous veins to eleviate the symptoms and complications of severe superficial venous reflux disease. He also recommended lymphedema pumps for better control of his lymphedema. The patient recently has had on 08/23/2016, right greater saphenous vein ablation with the laser energy Earlier lower extremity venous reflux examination done on 05/08/2016 showed no DVT or SVT both lower legs but incompetence of bilateral great saphenous veins was present. A lower arterial study was also done and there was no significant right lower and left lower extremity problems based on a normal toe brachial index bilaterally and the ABI was 1.21 the left and 1.23 on the right. His post ablation venous duplex examination showed successful ablation of the right GS vein with thrombus formation 2 below the right saphenofemoral junction. The deep system was patent without evidence of thrombosis and this was done on 08/30/2016. the patient also has a squamous cell cancer of the skin of the buttock and is recently undergone  radiation therapy for this prior to excisional surgery. Addendum: regarding his x-rays done today and x-ray of the left foot -- IMPRESSION: No objective evidence of osteomyelitis. There are soft tissue  changes which may reflect cellulitis. X-ray of the right foot -- IMPRESSION:Findings compatible with cellulitis of the toes. No objective evidence of osteomyelitis is observed. 10/01/16 on evaluation today patient's wounds appeared to be doing some better. I did review the x-rays as well which showed no evidence of osteomyelitis although there was evidence on x-ray of cellulitis. He fortunately is not having any discomfort although he continues to have some swelling. He does not remember being on any antibiotics recently. 10/15/16 on evaluation today patient's wounds overall appear to be doing better although he does have a new location noted on the left foot. Fortunately he is not having significant pain. It almost has the appearance that something is rubbing on the end of his toes but he wears the open toe shoes and according to what he is telling me never wears anything that would rub on his foot. There is no evidence of infection and specifically no evidence of a fungal infection 10/22/16 On evaluation today patient's wounds appeared to be doing better compared to last week in regard to his bilateral [name], [name] ([id number]) lower extremities. Fortunately I happy with how things are progressing although he still has ulcers I feel like that he is improving and appropriate manner. 11/12/16 on evaluation today patient appears to be doing well in regard to his bilateral feet and the respective wounds. We have been using surrounding her dressings along with an antifungal cream which seems to be doing very well. He has no bilateral dysfunction noticed that the rituals are weight loss at this point. He also has no nausea or vomiting a note purulent discharge. He did see Vein and vascular today and he  tells me that they told him he could have surgery for his venous stasis but they did not feel like it was worth it in his words. Fortunately patient's wounds do appear to be getting sneakily better. 11/26/2016 -- he says he is going to have some surgery during this week at [facility] for possibly a colon resection. 12/31/2016 -- the patient has been noncompliant with his smoking and I'm not sure whether he is also started drinking again. He continues to be very nonchalant about his care 01/14/2017 -- the patient's HandP has been reviewed well and I understand he is being compliant with trying to give up smoking and his local dressing changes. He does not have any surgical options of 4 to him by his vascular surgeons.he was last seen in early August by Dr. Hortencia Pilar who recommended compression stockings,and possibly lymph pumps in 2-3 months after doing a review ultrasound. 01/28/2017 - the patient did not have any fresh complaints but on examination I noted a large lacerated wound on the plantar aspect of his right fourth toe which had a lot of necrotic debris and it probes down to bone. 02/07/2017 -- x-ray of the right foot -- IMPRESSION: Soft tissue swelling about the first through fourth toes consistent with cellulitis. New destructive change in the tuft of the distal phalanx of the great toe is consistent with osteomyelitis. 02/14/2017 -- the patient's MRI is pending this coming Monday and he still continues to smoke. We have again gone over off loading of his wounds in great detail and he says he's been compliant. 02/21/2017 -- MR of the right foot -- IMPRESSION: 1. Soft tissue ulcer at the tip of the first, second and third toe knows. Cortical irregularity and bone marrow edema in the first distal phalanx most concerning for osteomyelitis. Mild  marrow edema in the second and third distal phalanx without definite cortical destruction which may reflect early osteomyelitis versus  reactive marrow edema. 2. Soft tissue edema surrounding the first phalanx most consistent with cellulitis. the patient was also recently evaluated by his medical oncologist Dr. 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 She is treating him with weekly Procrit. She is also keeping intra-abdominal lymphadenopathy and right lower lobe lung nodule under observation. Electronic Signature(s) Signed: 03/21/2017 11:24:18 AM By: Christin Fudge MD, FACS Entered By: Christin Fudge on 03/21/2017 11:24:18 Borner, [name] ([id number]) -------------------------------------------------------------------------------- Physical Exam Details Patient Name: [name], [name] Date of Service: 03/21/2017 11:00 AM Medical Record Number: [id number] Patient Account Number: [id number] Date of Birth/Sex: 08-16-1950 (66 y.o. Male) Treating RN: Montey Hora Primary Care Provider: Myrtie Hawk Other Clinician: Referring Provider: Myrtie Hawk Treating Provider/Extender: Frann Rider in Treatment: 25 Constitutional . Pulse regular. Respirations normal and unlabored. Afebrile. . Eyes Nonicteric. Reactive to light. Ears, Nose, Mouth, and Throat Lips, teeth, and gums WNL.Marland Kitchen Moist mucosa without lesions. Neck supple and nontender. No palpable supraclavicular or cervical adenopathy. Normal sized without goiter. Respiratory WNL. No retractions.. Breath sounds WNL, No rubs, rales, rhonchi, or wheeze.. Cardiovascular Heart rhythm and rate regular, no murmur or gallop.. Pedal Pulses WNL. No clubbing, cyanosis or edema. Chest Breasts symmetical and no nipple discharge.. Breast tissue WNL, no masses, lumps, or tenderness.. Lymphatic No adneopathy. No adenopathy. No adenopathy. Musculoskeletal Adexa without tenderness or enlargement.. Digits and nails w/o clubbing, cyanosis, infection, petechiae, ischemia, or inflammatory  conditions.. Integumentary (Hair, Skin) No suspicious lesions. No crepitus or fluctuance. No peri-wound warmth or erythema. No masses.Marland Kitchen Psychiatric Judgement and insight Intact.. No evidence of depression, anxiety, or agitation.. Notes there is a new wound on the plantar aspect of the right big toe and this is fairly clean and no sharp debridement was required today. His fungal infection is better controlled. Electronic Signature(s) Signed: 03/21/2017 11:25:00 AM By: Christin Fudge MD, FACS Entered By: Christin Fudge on 03/21/2017 11:25:00 Lohmann, [name] ([id number]) -------------------------------------------------------------------------------- Physician Orders Details Patient Name: [name], [name] Date of Service: 03/21/2017 11:00 AM Medical Record Number: [id number] Patient Account Number: [id number] Date of Birth/Sex: 19-Dec-1950 (66 y.o. Male) Treating RN: Montey Hora Primary Care Provider: Myrtie Hawk Other Clinician: Referring Provider: Myrtie Hawk Treating Provider/Extender: Frann Rider in Treatment: 15 Verbal / Phone Orders: No Diagnosis Coding Wound Cleansing Wound #1 Right Toe Great o Clean wound with Normal Saline. o May Shower, gently pat wound dry prior to applying new dressing. Wound #2 Right Toe Second o Clean wound with Normal Saline. o May Shower, gently pat wound dry prior to applying new dressing. Wound #5 Left Toe Great o Clean wound with Normal Saline. o May Shower, gently pat wound dry prior to applying new dressing. Wound #6 Left Toe Second o Clean wound with Normal Saline. o May Shower, gently pat wound dry prior to applying new dressing. Wound #8 Left Toe Third o Clean wound with Normal Saline. o May Shower, gently pat wound dry prior to applying new dressing. Wound #9 Right Toe Third o Clean wound with Normal Saline. o May Shower, gently pat wound dry prior to applying new dressing. Primary  Wound Dressing Wound #1 Right Toe Great o [place] Non-Adherent o Other: - nystatin Wound #2 Right Toe Second o [place] Non-Adherent o Other: - nystatin Wound #5 Left Toe Great o [place] Non-Adherent o Other: - nystatin  Wound #6 Left Toe Second o [place] Non-Adherent o Other: - nystatin Wound #8 Left Toe Third o [place] Non-Adherent o Other: - nystatin [name], [name] ([id number]) Wound #9 Right Toe Third o [place]cel Non-Adherent o Other: - nystatin Secondary Dressing Wound #1 Right Toe Great o Gauze and Kerlix/Conform Wound #2 Right Toe Second o Gauze and Kerlix/Conform Wound #5 Left Toe Great o Gauze and Kerlix/Conform Wound #6 Left Toe Second o Gauze and Kerlix/Conform Wound #8 Left Toe Third o Gauze and Kerlix/Conform Wound #9 Right Toe Third o Gauze and Kerlix/Conform Dressing Change Frequency Wound #1 Right Toe Great o Change dressing every day. Wound #2 Right Toe Second o Change dressing every day. Wound #5 Left Toe Great o Change dressing every day. Wound #6 Left Toe Second o Change dressing every day. Wound #8 Left Toe Third o Change dressing every day. Wound #9 Right Toe Third o Change dressing every day. Additional Orders / Instructions Wound #1 Right Toe Great o Stop Smoking o Increase protein intake. o Other: - Please add vitamin A, vitamin C and zinc supplements to your diet Wound #2 Right Toe Second o Stop Smoking o Increase protein intake. o Other: - Please add vitamin A, vitamin C and zinc supplements to your diet Wound #5 Left Toe Great o Stop Smoking Glance, [name] E. ([id number]) o Increase protein intake. o Other: - Please add vitamin A, vitamin C and zinc supplements to your diet Wound #6 Left Toe Second o Stop Smoking o Increase protein intake. o Other: - Please add vitamin A, vitamin C and zinc supplements to your diet Wound #8 Left Toe Third o Stop  Smoking o Increase protein intake. o Other: - Please add vitamin A, vitamin C and zinc supplements to your diet Wound #9 Right Toe Third o Stop Smoking o Increase protein intake. o Other: - Please add vitamin A, vitamin C and zinc supplements to your diet Patient Medications Allergies: No Known Drug Allergies Notifications Medication Indication Start End clotrimazole 03/21/2017 DOSE topical 1 % cream - cream topical as directed Electronic Signature(s) Signed: 03/21/2017 11:26:48 AM By: Christin Fudge MD, FACS Entered By: Christin Fudge on 03/21/2017 11:26:48 [name] ([id number]) -------------------------------------------------------------------------------- Prescription 03/21/2017 Patient Name: [name] Rx Reference #: Date of Birth: 15-May-1950 Provider: Christin Fudge MD Sex: M NPI#: [id number] Phone #: [phone] DEA#: [id number] License #: Patient Address: [place] [address] [place], [place] [address] [facility] [address] [place], [place] [address] [phone] Allergies No Known Drug Allergies Medication Medication: Route: Strength: Form: clotrimazole topical 1% cream Class: TOPICAL ANTIFUNGALS Dose: Frequency / Time: Indication: cream topical as directed Number of Refills: Number of Units: 3 One (1) Tube(s) Generic Substitution: Start Date: End Date: Administered at Substitution Permitted 16/60/6301 Facility: No Note to Pharmacy: Signature(s): Date(s): Electronic Signature(s) Signed: 03/21/2017 3:15:43 PM By: Christin Fudge MD, FACS Entered By: Christin Fudge on 03/21/2017 11:26:48 [name], [name] EMarland Kitchen ([id number]) --------------------------------------------------------------------------------  Problem List Details Patient Name: [name], [name] Date of Service: 03/21/2017 11:00 AM Medical Record Number: [id number] Patient Account Number: [id number] Date of  Birth/Sex: 1951-01-17 (66 y.o. Male) Treating RN: Montey Hora Primary Care Provider: Myrtie Hawk Other Clinician: Referring Provider: Myrtie Hawk Treating Provider/Extender: Frann Rider in Treatment: 25 Active Problems ICD-10 Encounter Code Description Active Date Diagnosis E11.621 Type 2 diabetes mellitus with foot ulcer 09/21/2016 Yes I87.313 Chronic venous hypertension (idiopathic) with ulcer of bilateral 09/21/2016 Yes lower extremity I89.0 Lymphedema, not  elsewhere classified 09/21/2016 Yes L97.522 Non-pressure chronic ulcer of other part of left foot with fat layer 09/21/2016 Yes exposed L97.512 Non-pressure chronic ulcer of other part of right foot with fat layer 09/21/2016 Yes exposed F17.218 Nicotine dependence, cigarettes, with other nicotine-induced 09/21/2016 Yes disorders F10.19 Alcohol abuse with unspecified alcohol-induced disorder 09/21/2016 Yes M86.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, right ankle and foot 02/21/2017 Yes Inactive Problems Resolved Problems Electronic Signature(s) Signed: 03/21/2017 11:23:59 AM By: Christin Fudge MD, FACS Entered By: Christin Fudge on 03/21/2017 11:23:58 Yanni, Ahmani EMarland Kitchen ([id number]) -------------------------------------------------------------------------------- Progress Note Details Patient Name: Willert, Jakiah E. Date of Service: 03/21/2017 11:00 AM Medical Record Number: [id number] Patient Account Number: [id number] Date of Birth/Sex: Apr 03, 1951 (66 y.o. Male) Treating RN: Montey Hora Primary Care Provider: Myrtie Hawk Other Clinician: Referring Provider: Myrtie Hawk Treating Provider/Extender: Frann Rider in Treatment: 25 Subjective Chief Complaint Information obtained from Patient Patients presents for treatment of an open diabetic ulcer to both feet History of Present Illness (HPI) The following HPI elements were documented for the patient's wound: Location:  bilateral feet ulceration on the toes Quality: Patient reports experiencing a dull pain to affected area(s). Severity: Patient states wound are getting better Duration: Patient has had the wound for > 3 months prior to seeking treatment at the wound center Timing: Pain in wound is constant (hurts all the time) Context: The wound would happen gradually Modifying Factors: Other treatment(s) tried include:treatment for lymphedema and is seen by the podiatrist Dr. Caryl Comes Associated Signs and Symptoms: Patient reports having increase swelling. 66 year old patient here to see as for bilateral feet ulceration to on his left first and second toe and 2 on his right first and second toe, which she's had for about 4 months. 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 Donella Stade. The patient is also undergoing workup by medical oncology for a intra-abdominal lymphadenopathy. 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 He is also status post appendectomy, inguinal lymph node biopsy, rectal biopsy and rectal examination under anesthesia. he currently smokes cigarettes about half packet a day. In March of this year he was seen by Dr. Hortencia Pilar, for evaluation of bilateral varicose veins and besides wearing compression stockings he had recommended laser ablation of the right and left great saphenous veins to eleviate the symptoms and complications of severe superficial venous reflux disease. He also recommended lymphedema pumps for better control of his lymphedema. The patient recently has had on 08/23/2016, right greater saphenous vein ablation with the laser energy Earlier lower extremity venous reflux examination done on 05/08/2016 showed no DVT or SVT both lower  legs but incompetence of bilateral great saphenous veins was present. A lower arterial study was also done and there was no significant right lower and left lower extremity problems based on a normal toe brachial index bilaterally and the ABI was 1.21 the left and 1.23 on the right. His post ablation venous duplex examination showed successful ablation of the right GS vein with thrombus formation 2 below the right saphenofemoral junction. The deep system was patent without evidence of thrombosis and this was done on 08/30/2016. 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 Addendum: regarding his x-rays  done today and x-ray of the left foot -- IMPRESSION: No objective evidence of osteomyelitis. There are soft tissue changes which may reflect cellulitis. X-ray of the right foot -- IMPRESSION:Findings compatible with cellulitis of the toes. No objective evidence of osteomyelitis is observed. 10/01/16 on evaluation today patient's wounds appeared to be doing some better. I did review the x-rays as well which showed no evidence of osteomyelitis although there was evidence on x-ray of cellulitis. He fortunately is not having any discomfort Orzel, Manus E. ([id number]) although he continues to have some swelling. He does not remember being on any antibiotics recently. 10/15/16 on evaluation today patient's wounds overall appear to be doing better although he does have a new location noted on the left foot. Fortunately he is not having significant pain. It almost has the appearance that something is rubbing on the end of his toes but he wears the open toe shoes and according to what he is telling me never wears anything that would rub on his foot. There is no evidence of infection and specifically no evidence of a fungal infection 10/22/16 On evaluation today patient's wounds appeared to be doing  better compared to last week in regard to his bilateral lower extremities. Fortunately I happy with how things are progressing although he still has ulcers I feel like that he is improving and appropriate manner. 11/12/16 on evaluation today patient appears to be doing well in regard to his bilateral feet and the respective wounds. We have been using surrounding her dressings along with an antifungal cream which seems to be doing very well. He has no bilateral dysfunction noticed that the rituals are weight loss at this point. He also has no nausea or vomiting a note purulent discharge. He did see Vein and vascular today and he tells me that they told him he could have surgery for his venous stasis but they did not feel like it was worth it in his words. Fortunately patient's wounds do appear to be getting sneakily better. 11/26/2016 -- he says he is going to have some surgery during this week at [facility] for possibly a colon resection. 12/31/2016 -- the patient has been noncompliant with his smoking and I'm not sure whether he is also started drinking again. He continues to be very nonchalant about his care 01/14/2017 -- the patient's HandP has been reviewed well and I understand he is being compliant with trying to give up smoking and his local dressing changes. He does not have any surgical options of 4 to him by his vascular surgeons.he was last seen in early August by Dr. Hortencia Pilar who recommended compression stockings,and possibly lymph pumps in 2-3 months after doing a review ultrasound. 01/28/2017 - the patient did not have any fresh complaints but on examination I noted a large lacerated wound on the plantar aspect of his right fourth toe which had a lot of necrotic debris and it probes down to bone. 02/07/2017 -- x-ray of the right foot -- IMPRESSION: Soft tissue swelling about the first through fourth toes consistent with cellulitis. New destructive change in the tuft of the  distal phalanx of the great toe is consistent with osteomyelitis. 02/14/2017 -- the patient's MRI is pending this coming Monday and he still continues to smoke. We have again gone over off loading of his wounds in great detail and he says he's been compliant. 02/21/2017 -- MR of the right foot -- IMPRESSION: 1. Soft tissue ulcer at the tip of the first, second  and third toe knows. Cortical irregularity and bone marrow edema in the first distal phalanx most concerning for osteomyelitis. Mild marrow edema in the second and third distal phalanx without definite cortical destruction which may reflect early osteomyelitis versus reactive marrow edema. 2. Soft tissue edema surrounding the first phalanx most consistent with cellulitis. the patient was also recently evaluated by his medical oncologist Dr. 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 She is treating him with weekly Procrit. She is also keeping intra-abdominal lymphadenopathy and right lower lobe lung nodule under observation. Patient History Information obtained from Patient. Social History Current every day smoker, Marital Status - Widowed, Alcohol Use - Daily - quit drinking about a week ago, Drug Use - No History, Caffeine Use - Moderate. 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 ([id number]) Objective Constitutional Pulse regular. Respirations normal and unlabored. Afebrile. Vitals Time Taken: 11:08 AM, Height: 69 in, Weight: 168 lbs, BMI: 24.8, Temperature: 98.0 F, Pulse: 71 bpm, Respiratory Rate: 18 breaths/min, Blood Pressure: 165/67 mmHg. Eyes Nonicteric. Reactive to light. Ears, Nose, Mouth, and Throat Lips, teeth, and gums WNL.Marland Kitchen Moist mucosa without lesions. Neck supple and nontender. No palpable supraclavicular or cervical adenopathy. Normal sized without  goiter. Respiratory WNL. No retractions.. Breath sounds WNL, No rubs, rales, rhonchi, or wheeze.. Cardiovascular Heart rhythm and rate regular, no murmur or gallop.. Pedal Pulses WNL. No clubbing, cyanosis or edema. Chest Breasts symmetical and no nipple discharge.. Breast tissue WNL, no masses, lumps, or tenderness.. Lymphatic No adneopathy. No adenopathy. No adenopathy. Musculoskeletal Adexa without tenderness or enlargement.. Digits and nails w/o clubbing, cyanosis, infection, petechiae, ischemia, or inflammatory conditions.Marland Kitchen Psychiatric Judgement and insight Intact.. No evidence of depression, anxiety, or agitation.. General Notes: there is a new wound on the plantar aspect of the right big toe and this is fairly clean and no sharp debridement was required today. His fungal infection is better controlled. Integumentary (Hair, Skin) No suspicious lesions. No crepitus or fluctuance. No peri-wound warmth or erythema. No masses.. Wound #1 status is Open. Original cause of wound was Gradually Appeared. The wound is located on the Right Toe Great. The wound measures 1.7cm length x 1.8cm width x 0.1cm depth; 2.403cm^2 area and 0.24cm^3 volume. There is Fat Layer (Subcutaneous Tissue) Exposed exposed. There is no tunneling or undermining noted. There is a large amount of serosanguineous drainage noted. The wound margin is flat and intact. There is medium (34-66%) pink granulation within the wound bed. There is a medium (34-66%) amount of necrotic tissue within the wound bed including Adherent Slough. The periwound skin appearance exhibited: Excoriation, Maceration. The periwound skin appearance did not exhibit: Callus, Crepitus, Induration, Rash, Scarring, Dry/Scaly, Atrophie Blanche, Cyanosis, Ecchymosis, Hemosiderin Staining, Mottled, Pallor, Rubor, Erythema. Periwound temperature was noted as No Abnormality. FARRON, WATROUS ([id number]) Wound #10 status is Healed - Epithelialized. Original  cause of wound was Not Known. The wound is located on the Right Toe Fourth. The wound measures 0cm length x 0cm width x 0cm depth; 0cm^2 area and 0cm^3 volume. Wound #2 status is Open. Original cause of wound was Gradually Appeared. The wound is located on the Right Toe Second. The wound measures 1.3cm length x 1.6cm width x 0.1cm depth; 1.634cm^2 area and 0.163cm^3 volume. There is Fat Layer (Subcutaneous Tissue) Exposed exposed. There is no tunneling or undermining noted. There is a large amount of serosanguineous drainage  noted. The wound margin is flat and intact. There is medium (34-66%) pink granulation within the wound bed. There is a medium (34-66%) amount of necrotic tissue within the wound bed including Adherent Slough. The periwound skin appearance exhibited: Excoriation, Maceration. The periwound skin appearance did not exhibit: Callus, Crepitus, Induration, Rash, Scarring, Dry/Scaly, Atrophie Blanche, Cyanosis, Ecchymosis, Hemosiderin Staining, Mottled, Pallor, Rubor, Erythema. Periwound temperature was noted as No Abnormality. Wound #5 status is Open. Original cause of wound was Gradually Appeared. The wound is located on the Left Toe Great. The wound measures 0.1cm length x 0.1cm width x 0.1cm depth; 0.008cm^2 area and 0.001cm^3 volume. There is Fat Layer (Subcutaneous Tissue) Exposed exposed. There is no tunneling or undermining noted. There is a large amount of serous drainage noted. Foul odor after cleansing was noted. The wound margin is flat and intact. There is medium (34-66%) pink granulation within the wound bed. There is a medium (34-66%) amount of necrotic tissue within the wound bed including Adherent Slough. The periwound skin appearance exhibited: Excoriation, Maceration. The periwound skin appearance did not exhibit: Callus, Crepitus, Induration, Rash, Scarring, Dry/Scaly, Atrophie Blanche, Cyanosis, Ecchymosis, Hemosiderin Staining, Mottled, Pallor, Rubor, Erythema.  Periwound temperature was noted as No Abnormality. Wound #6 status is Open. Original cause of wound was Gradually Appeared. The wound is located on the Left Toe Second. The wound measures 2cm length x 1.5cm width x 0.1cm depth; 2.356cm^2 area and 0.236cm^3 volume. There is Fat Layer (Subcutaneous Tissue) Exposed exposed. There is no tunneling or undermining noted. There is a large amount of serous drainage noted. The wound margin is flat and intact. There is medium (34-66%) pink granulation within the wound bed. There is a medium (34-66%) amount of necrotic tissue within the wound bed including Adherent Slough. The periwound skin appearance exhibited: Excoriation, Maceration. The periwound skin appearance did not exhibit: Callus, Crepitus, Induration, Rash, Scarring, Dry/Scaly, Atrophie Blanche, Cyanosis, Ecchymosis, Hemosiderin Staining, Mottled, Pallor, Rubor, Erythema. Periwound temperature was noted as No Abnormality. Wound #8 status is Open. Original cause of wound was Gradually Appeared. The wound is located on the Left Toe Third. The wound measures 0.1cm length x 0.1cm width x 0.1cm depth; 0.008cm^2 area and 0.001cm^3 volume. There is no tunneling or undermining noted. There is a large amount of serosanguineous drainage noted. The wound margin is distinct with the outline attached to the wound base. There is medium (34-66%) red granulation within the wound bed. There is a medium (34- 66%) amount of necrotic tissue within the wound bed including Adherent Slough. The periwound skin appearance exhibited: Maceration. Periwound temperature was noted as No Abnormality. The periwound has tenderness on palpation. Wound #9 status is Open. Original cause of wound was Gradually Appeared. The wound is located on the Right Toe Third. The wound measures 1.1cm length x 1cm width x 0.1cm depth; 0.864cm^2 area and 0.086cm^3 volume. There is no tunneling or undermining noted. There is a large amount of serous  drainage noted. Foul odor after cleansing was noted. The wound margin is flat and intact. There is medium (34-66%) pink granulation within the wound bed. There is a medium (34- 66%) amount of necrotic tissue within the wound bed including Adherent Slough. The periwound skin appearance exhibited: Maceration. The periwound skin appearance did not exhibit: Callus, Crepitus, Excoriation, Induration, Rash, Scarring, Dry/Scaly, Atrophie Blanche, Cyanosis, Ecchymosis, Hemosiderin Staining, Mottled, Pallor, Rubor, Erythema. Periwound temperature was noted as No Abnormality. 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 ([id number]) L97.512 - Non-pressure chronic ulcer of other part of right foot with fat layer exposed F17.218 - Nicotine dependence, cigarettes, with other nicotine-induced disorders F10.19 - Alcohol abuse 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 - Chronic multifocal osteomyelitis, right ankle and foot Plan Wound Cleansing: Wound #1 Right Toe Great: Clean wound with Normal Saline. May Shower, gently pat wound dry prior to applying new dressing. Wound #2 Right Toe Second: Clean wound with Normal Saline. May Shower, gently pat wound dry prior to applying new dressing. Wound #5 Left Toe Great: Clean wound with Normal Saline. May Shower, gently pat wound dry prior to applying new dressing. Wound #6 Left Toe Second: Clean wound with Normal Saline. May Shower, gently pat wound dry prior to applying new dressing. Wound #8 Left Toe Third: Clean wound with Normal Saline. May Shower, gently pat wound dry prior to applying new dressing. Wound #9 Right Toe Third: Clean wound with Normal Saline. May  Shower, gently pat wound dry prior to applying new dressing. Primary Wound Dressing: Wound #1 Right Toe Great: Silvercel Non-Adherent Other: - nystatin Wound #2 Right Toe Second: Silvercel Non-Adherent Other: - nystatin Wound #5 Left Toe Great: Silvercel Non-Adherent Other: - nystatin Wound #6 Left Toe Second: Silvercel Non-Adherent Other: - nystatin Wound #8 Left Toe Third: Silvercel Non-Adherent Other: - nystatin Wound #9 Right Toe Third: Silvercel Non-Adherent Other: - nystatin Secondary Dressing: Wound #1 Right Toe Great: Gauze and Kerlix/Conform Wound #2 Right Toe Second: Gauze and Kerlix/Conform Wound #5 Left Toe Great: Gauze and Kerlix/Conform Wound #6 Left Toe Second: Gauze and Kerlix/Conform Colin, Lemarcus E. ([id number]) Wound #8 Left Toe Third: Gauze and Kerlix/Conform Wound #9 Right Toe Third: Gauze and Kerlix/Conform Dressing Change Frequency: Wound #1 Right Toe Great: Change dressing every day. Wound #2 Right Toe Second: Change dressing every day. Wound #5 Left Toe Great: Change dressing every day. Wound #6 Left Toe Second: Change dressing every day. Wound #8 Left Toe Third: Change dressing every day. Wound #9 Right Toe Third: Change dressing every day. Additional Orders / Instructions: Wound #1 Right Toe Great: Stop Smoking Increase protein intake. Other: - Please add vitamin A, vitamin C and zinc supplements to your diet Wound #2 Right Toe Second: Stop Smoking Increase protein intake. Other: - Please add vitamin A, vitamin C and zinc supplements to your diet Wound #5 Left Toe Great: Stop Smoking Increase protein intake. Other: - Please add vitamin A, vitamin C and zinc supplements to your diet Wound #6 Left Toe Second: Stop Smoking Increase protein intake. Other: - Please add vitamin A, vitamin C and zinc supplements to your diet Wound #8 Left Toe Third: Stop Smoking Increase protein intake. Other: - Please add vitamin A, vitamin C and  zinc supplements to your diet Wound #9 Right Toe Third: Stop Smoking Increase protein intake. Other: - Please add vitamin A, vitamin C and zinc supplements to your diet The following medication(s) was prescribed: clotrimazole topical 1 % cream cream topical as directed starting 03/21/2017 Due to the inclement weather he missed his appointment with Dr. Ola Spurr and this has been rescheduled for December 17. In the meanwhile, I have recommended: 1. packing this with Silver alginate and an appropriate foam off loading. 2. I have encouraged him to wear his compression stockings previously advised 3. adequate protein, vitamin A, vitamin C  and zinc 4. Completely give up smoking Dubow, MARUICE [name] ([id number]) Electronic Signature(s) Signed: 03/21/2017 11:27:48 AM By: Christin Fudge MD, FACS Entered By: Christin Fudge on 03/21/2017 11:27:47 [name] ([id number]) -------------------------------------------------------------------------------- ROS/PFSH Details Patient Name: Coplen, Kassem E. Date of Service: 03/21/2017 11:00 AM Medical Record Number: [id number] Patient Account Number: [id number] Date of Birth/Sex: Sep 06, 1950 (66 y.o. Male) Treating RN: Montey Hora Primary Care Provider: Myrtie Hawk Other Clinician: Referring Provider: Myrtie Hawk Treating Provider/Extender: Frann Rider in Treatment: 25 Information Obtained From Patient Wound History Do you currently have one or more open woundso Yes How many open wounds do you currently haveo 6 Approximately how long have you had your woundso 3 months How have you been treating your wound(s) until nowo ointment and bandage Has your wound(s) ever healed and then re-openedo No Have you had any lab work done in the past montho No Have you tested positive for an antibiotic resistant organism (MRSA, VRE)o No Have you tested positive for osteomyelitis (bone infection)o No Have you had any tests for circulation  on your legso Yes Who ordered the testo PCP Where was the test doneo [facility] Medical History: Negative for: Cataracts; Glaucoma; Optic Neuritis Ear/Nose/Mouth/Throat Medical History: Negative for: Chronic sinus problems/congestion; Middle ear problems Hematologic/Lymphatic Medical History: Positive for: Anemia; Lymphedema Negative for: Hemophilia; Human Immunodeficiency Virus; Sickle Cell Disease Respiratory Medical History: Negative for: Aspiration; Asthma; Chronic Obstructive Pulmonary Disease (COPD); Pneumothorax; Sleep Apnea; Tuberculosis Cardiovascular Medical History: Positive for: Congestive Heart Failure; Hypertension; Peripheral Venous Disease Negative for: Angina; Arrhythmia; Coronary Artery Disease; Deep Vein Thrombosis; Hypotension; Myocardial Infarction; Peripheral Arterial Disease; Phlebitis; Vasculitis Gastrointestinal Medical History: Negative for: Cirrhosis ; Colitis; Crohnos; Hepatitis A; Hepatitis B; Hepatitis C Krausz, Gussie E. ([id number]) Endocrine Medical History: Positive for: Type II Diabetes Treated with: Oral agents Blood sugar tested every day: Yes Tested : QD Genitourinary Medical History: Negative for: End Stage Renal Disease Immunological Medical History: Negative for: Lupus Erythematosus; Raynaudos; Scleroderma Integumentary (Skin) Medical History: Negative for: History of Burn; History of pressure wounds Musculoskeletal Medical History: Negative for: Gout; Rheumatoid Arthritis; Osteoarthritis; Osteomyelitis Neurologic Medical History: Positive for: Neuropathy Negative for: Dementia; Quadriplegia; Paraplegia; Seizure Disorder Oncologic Medical History: Past Medical History Notes: 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 Electronic Signature(s) Signed: 03/21/2017 3:15:43 PM By: Christin Fudge MD, FACS Signed: 03/21/2017 4:52:42 PM By: Deniece Ree, [name] ([id number]) Entered By: Christin Fudge on 03/21/2017 11:24:25 [name] ([id number]) -------------------------------------------------------------------------------- SuperBill Details Patient Name: Connell, Giovani E. Date of Service: 03/21/2017 Medical Record Number: [id number] Patient Account Number: [id number] Date of Birth/Sex: [birth year] (66 y.o. Male) Treating RN: Montey Hora Primary Care Provider: Myrtie Hawk Other Clinician: Referring Provider: Myrtie Hawk Treating Provider/Extender: Frann Rider in Treatment: 25 Diagnosis Coding ICD-10 Codes Code Description E11.621 Type 2 diabetes mellitus with foot ulcer I87.313 Chronic venous hypertension (idiopathic) with ulcer of bilateral lower extremity I89.0 Lymphedema, not elsewhere classified L97.522 Non-pressure chronic ulcer of other part of left foot with fat layer exposed L97.512  Non-pressure chronic ulcer of other part of right foot with fat layer exposed F17.218 Nicotine dependence, cigarettes, 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 abuse 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, right ankle and foot Facility Procedures CPT4 Code: [id number] Description: 80012 - WOUND CARE VISIT-LEV 5 EST PT Modifier: Quantity: 1 Physician Procedures CPT4 Code Description: [id number] - WC PHYS LEVEL 3 - EST PT ICD-10  Diagnosis Description E11.621 Type 2 diabetes mellitus with foot ulcer I87.313 Chronic venous hypertension (idiopathic) with ulcer of bilateral I89.0 Lymphedema, not elsewhere  classified L97.522 Non-pressure chronic ulcer of other part of left foot with fat l Modifier: lower extremi ayer exposed Quantity: 1 ty Electronic Signature(s) Signed: 03/21/2017 3:15:43 PM By: Christin Fudge MD, FACS Signed: 03/21/2017 4:52:42 PM By: Montey Hora Previous Signature: 03/21/2017 11:28:02 AM Version By: Christin Fudge MD, FACS Entered By: Montey Hora on 03/21/2017 13:11:45

## 2017-03-26 ENCOUNTER — Inpatient Hospital Stay: Payer: Medicare HMO

## 2017-03-26 VITALS — BP 165/82

## 2017-03-26 DIAGNOSIS — D638 Anemia in other chronic diseases classified elsewhere: Secondary | ICD-10-CM

## 2017-03-26 DIAGNOSIS — Z862 Personal history of diseases of the blood and blood-forming organs and certain disorders involving the immune mechanism: Secondary | ICD-10-CM

## 2017-03-26 DIAGNOSIS — D509 Iron deficiency anemia, unspecified: Secondary | ICD-10-CM

## 2017-03-26 DIAGNOSIS — D649 Anemia, unspecified: Secondary | ICD-10-CM

## 2017-03-26 DIAGNOSIS — N189 Chronic kidney disease, unspecified: Secondary | ICD-10-CM

## 2017-03-26 DIAGNOSIS — I130 Hypertensive heart and chronic kidney disease with heart failure and stage 1 through stage 4 chronic kidney disease, or unspecified chronic kidney disease: Secondary | ICD-10-CM | POA: Diagnosis not present

## 2017-03-26 LAB — HEMOGLOBIN: HEMOGLOBIN: 8.9 g/dL — AB (ref 13.0–18.0)

## 2017-03-26 MED ORDER — EPOETIN ALFA 10000 UNIT/ML IJ SOLN
10000.0000 [IU] | INTRAMUSCULAR | Status: DC
  Administered 2017-03-26: 10000 [IU] via SUBCUTANEOUS
  Filled 2017-03-26: qty 2

## 2017-03-26 NOTE — Progress Notes (Signed)
MD approves patient to receive Procrit today with bp of 165/82

## 2017-04-04 ENCOUNTER — Encounter: Payer: Medicare HMO | Admitting: Surgery

## 2017-04-04 DIAGNOSIS — E11621 Type 2 diabetes mellitus with foot ulcer: Secondary | ICD-10-CM | POA: Diagnosis not present

## 2017-04-04 NOTE — Progress Notes (Addendum)
KETAN, RENZ ([id number]) Visit Report for 04/04/2017 Chief Complaint Document Details Patient Name: [name] [name] [name]. Date of Service: 04/04/2017 11:00 AM Medical Record Number: [id number] Patient Account Number: [id number] Date of Birth/Sex: 1951-02-21 (66 y.o. Male) Treating RN: Montey Hora Primary Care Provider: Myrtie Hawk Other Clinician: Referring Provider: Myrtie Hawk Treating Provider/Extender: Frann Rider in Treatment: 64 Information Obtained from: Patient Chief Complaint Patients presents for treatment of an open diabetic ulcer to both feet Electronic Signature(s) Signed: 04/04/2017 11:52:56 AM By: Christin Fudge MD, FACS Entered By: Christin Fudge on 04/04/2017 11:52:56 [name] [name] [name] [name] ([id number]) -------------------------------------------------------------------------------- HPI Details Patient Name: [name], [name] E. Date of Service: 04/04/2017 11:00 AM Medical Record Number: [id number] Patient Account Number: [id number] Date of Birth/Sex: 06-29-1950 (66 y.o. Male) Treating RN: Montey Hora Primary Care Provider: Myrtie Hawk Other Clinician: Referring Provider: Myrtie Hawk Treating Provider/Extender: Frann Rider in Treatment: 64 History of Present Illness Location: bilateral feet ulceration on the toes Quality: Patient reports experiencing a dull pain to affected area(s). Severity: Patient states wound are getting better Duration: Patient has had the wound for > 3 months prior to seeking treatment at the wound center Timing: Pain in wound is constant (hurts all the time) Context: The wound would happen gradually Modifying Factors: Other treatment(s) tried include:treatment for lymphedema and is seen by the podiatrist Dr. Caryl Comes Associated Signs and Symptoms: Patient reports having increase swelling. HPI Description: 66 year old patient here to see as for bilateral feet ulceration to on his left first and  second toe and 2 on his right first and second toe, which she's had for about 4 months. 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 Donella Stade. The patient is also undergoing workup by medical oncology for a intra-abdominal lymphadenopathy. 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 He is also status post appendectomy, inguinal lymph node biopsy, rectal biopsy and rectal examination under anesthesia. he currently smokes cigarettes about half packet a day. In March of this year he was seen by Dr. Hortencia Pilar, for evaluation of bilateral varicose veins and besides wearing compression stockings he had recommended laser ablation of the right and left great saphenous veins to eleviate the symptoms and complications of severe superficial venous reflux disease. He also recommended lymphedema pumps for better control of his lymphedema. The patient recently has had on 08/23/2016, right greater saphenous vein ablation with the laser energy Earlier lower extremity venous reflux examination done on 05/08/2016 showed no DVT or SVT both lower legs but incompetence of bilateral great saphenous veins was present. A lower arterial study was also done and there was no significant right lower and left lower extremity problems based on a normal toe brachial index bilaterally and the ABI was 1.21 the left and 1.23 on the right. His post ablation venous duplex examination showed successful ablation of the right GS vein with thrombus formation 2 below the right saphenofemoral junction. The deep system was patent without evidence of thrombosis and this was done on 08/30/2016. the patient also has a squamous cell cancer of the skin of the buttock and is recently undergone  radiation therapy for this prior to excisional surgery. Addendum: regarding his x-rays done today and x-ray of the left foot -- IMPRESSION: No objective evidence of osteomyelitis. There are soft tissue  changes which may reflect cellulitis. X-ray of the right foot -- IMPRESSION:Findings compatible with cellulitis of the toes. No objective evidence of osteomyelitis is observed. 10/01/16 on evaluation today patient's wounds appeared to be doing some better. I did review the x-rays as well which showed no evidence of osteomyelitis although there was evidence on x-ray of cellulitis. He fortunately is not having any discomfort although he continues to have some swelling. He does not remember being on any antibiotics recently. 10/15/16 on evaluation today patient's wounds overall appear to be doing better although he does have a new location noted on the left foot. Fortunately he is not having significant pain. It almost has the appearance that something is rubbing on the end of his toes but he wears the open toe shoes and according to what he is telling me never wears anything that would rub on his foot. There is no evidence of infection and specifically no evidence of a fungal infection 10/22/16 On evaluation today patient's wounds appeared to be doing better compared to last week in regard to his bilateral [name] [name] [name] E. ([id number]) lower extremities. Fortunately I happy with how things are progressing although he still has ulcers I feel like that he is improving and appropriate manner. 11/12/16 on evaluation today patient appears to be doing well in regard to his bilateral feet and the respective wounds. We have been using surrounding her dressings along with an antifungal cream which seems to be doing very well. He has no bilateral dysfunction noticed that the rituals are weight loss at this point. He also has no nausea or vomiting a note purulent discharge. He did see Vein and vascular today and he  tells me that they told him he could have surgery for his venous stasis but they did not feel like it was worth it in his words. Fortunately patient's wounds do appear to be getting sneakily better. 11/26/2016 -- he says he is going to have some surgery during this week at [facility] for possibly a colon resection. 12/31/2016 -- the patient has been noncompliant with his smoking and I'm not sure whether he is also started drinking again. He continues to be very nonchalant about his care 01/14/2017 -- the patient's HandP has been reviewed well and I understand he is being compliant with trying to give up smoking and his local dressing changes. He does not have any surgical options of 4 to him by his vascular surgeons.he was last seen in early August by Dr. Hortencia Pilar who recommended compression stockings,and possibly lymph pumps in 2-3 months after doing a review ultrasound. 01/28/2017 - the patient did not have any fresh complaints but on examination I noted a large lacerated wound on the plantar aspect of his right fourth toe which had a lot of necrotic debris and it probes down to bone. 02/07/2017 -- x-ray of the right foot -- IMPRESSION: Soft tissue swelling about the first through fourth toes consistent with cellulitis. New destructive change in the tuft of the distal phalanx of the great toe is consistent with osteomyelitis. 02/14/2017 -- the patient's MRI is pending this coming Monday and he still continues to smoke. We have again gone over off loading of his wounds in great detail and he says he's been compliant. 02/21/2017 -- MR of the right foot -- IMPRESSION: 1. Soft tissue ulcer at the tip of the first, second and third toe knows. Cortical irregularity and bone marrow edema in the first distal phalanx most concerning for osteomyelitis. Mild  marrow edema in the second and third distal phalanx without definite cortical destruction which may reflect early osteomyelitis versus  reactive marrow edema. 2. Soft tissue edema surrounding the first phalanx most consistent with cellulitis. the patient was also recently evaluated by his medical oncologist Dr. 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 She is treating him with weekly Procrit. She is also keeping intra-abdominal lymphadenopathy and right lower lobe lung nodule under observation. 04/04/2017 -- he was seen by Dr. Adrian Prows on 03/25/2017 -- after review he empirically put him on ciprofloxacin and doxycycline as they have good bone penetration and good bioavailability and it will cover the usual pathogens and diabetic foot osteomyelitis. He will check inflammatory markers and plan a 74-32 week old records. C-reactive protein was 0.3 and the ESR was 72 Electronic Signature(s) Signed: 04/04/2017 11:53:05 AM By: Christin Fudge MD, FACS Previous Signature: 04/04/2017 11:45:00 AM Version By: Christin Fudge MD, FACS Previous Signature: 04/04/2017 11:39:26 AM Version By: Christin Fudge MD, FACS Entered By: Christin Fudge on 04/04/2017 11:53:04 Branca, [name] [name] ([id number]) -------------------------------------------------------------------------------- Physical Exam Details Patient Name: Navis, [name] E. Date of Service: 04/04/2017 11:00 AM Medical Record Number: [id number] Patient Account Number: [id number] Date of Birth/Sex: 1950-05-10 (66 y.o. Male) Treating RN: Montey Hora Primary Care Provider: Myrtie Hawk Other Clinician: Referring Provider: Myrtie Hawk Treating Provider/Extender: Frann Rider in Treatment: 27 Constitutional . Pulse regular. Respirations normal and unlabored. Afebrile. . Eyes Nonicteric. Reactive to light. Ears, Nose, Mouth, and Throat Lips, teeth, and gums WNL.Marland Kitchen Moist mucosa without lesions. Neck supple and nontender. No palpable supraclavicular or cervical adenopathy. Normal sized without  goiter. Respiratory WNL. No retractions.. Cardiovascular Pedal Pulses WNL. No clubbing, cyanosis or edema. Chest Breasts symmetical and no nipple discharge.. Breast tissue WNL, no masses, lumps, or tenderness.. Lymphatic No adneopathy. No adenopathy. No adenopathy. Musculoskeletal Adexa without tenderness or enlargement.. Digits and nails w/o clubbing, cyanosis, infection, petechiae, ischemia, or inflammatory conditions.. Integumentary (Hair, Skin) No suspicious lesions. No crepitus or fluctuance. No peri-wound warmth or erythema. No masses.Marland Kitchen Psychiatric Judgement and insight Intact.. No evidence of depression, anxiety, or agitation.. Notes the wounds are looking about the same and there are no fresh wounds nor is there any palpable or probable bone. His fungal infection is overall looking better Electronic Signature(s) Signed: 04/04/2017 11:53:52 AM By: Christin Fudge MD, FACS Entered By: Christin Fudge on 04/04/2017 11:53:51 Wimbush, [name] [name] ([id number]) -------------------------------------------------------------------------------- Physician Orders Details Patient Name: Bankert, [name] E. Date of Service: 04/04/2017 11:00 AM Medical Record Number: [id number] Patient Account Number: [id number] Date of Birth/Sex: 10/22/1950 (66 y.o. Male) Treating RN: Cornell Barman Primary Care Provider: Myrtie Hawk Other Clinician: Referring Provider: Myrtie Hawk Treating Provider/Extender: Frann Rider in Treatment: 46 Verbal / Phone Orders: Yes Clinician: Cornell Barman Read Back and Verified: Yes Diagnosis Coding Wound Cleansing Wound #1 Right Toe Great o Clean wound with Normal Saline. o May Shower, gently pat wound dry prior to applying new dressing. Wound #2 Right Toe Second o Clean wound with Normal Saline. o May Shower, gently pat wound dry prior to applying new dressing. Wound #5 Left Toe Great o Clean wound with Normal Saline. o May Shower, gently  pat wound dry prior to applying new dressing. Wound #6 Left Toe Second o Clean wound with Normal Saline. o May Shower, gently pat wound dry prior to applying new dressing. Wound #8 Left Toe Third o Clean wound with Normal Saline. o May Shower,  gently pat wound dry prior to applying new dressing. Wound #9 Right Toe Third o Clean wound with Normal Saline. o May Shower, gently pat wound dry prior to applying new dressing. Anesthetic (add to Medication List) Wound #1 Right Toe Great o Topical Lidocaine 4% cream applied to wound bed prior to debridement (In Clinic Only). Wound #5 Left Toe Great o Topical Lidocaine 4% cream applied to wound bed prior to debridement (In Clinic Only). Skin Barriers/Peri-Wound Care Wound #1 Right Toe Great o Antifungal cream Wound #2 Right Toe Second o Antifungal cream Wound #5 Left Toe Great o Antifungal cream Wound #6 Left Toe Second o Antifungal cream [name] [name] [name] E. ([id number]) Wound #8 Left Toe Third o Antifungal cream Wound #9 Right Toe Third o Antifungal cream Primary Wound Dressing Wound #1 Right Toe Great o [place] Non-Adherent o Other: - nystatin Wound #2 Right Toe Second o [place] Non-Adherent o Other: - nystatin Wound #5 Left Toe Great o [place] Non-Adherent o Other: - nystatin Wound #6 Left Toe Second o [place] Non-Adherent o Other: - nystatin Wound #8 Left Toe Third o [place] Non-Adherent o Other: - nystatin Wound #9 Right Toe Third o [place] Non-Adherent o Other: - nystatin Secondary Dressing Wound #1 Right Toe Great o Gauze and Kerlix/Conform Wound #2 Right Toe Second o Gauze and Kerlix/Conform Wound #5 Left Toe Great o Gauze and Kerlix/Conform Wound #6 Left Toe Second o Gauze and Kerlix/Conform Wound #8 Left Toe Third o Gauze and Kerlix/Conform Wound #9 Right Toe Third o Gauze and Kerlix/Conform Dressing Change Frequency Wound #1 Right Toe  Great o Change dressing every day. Wound #2 Right Toe Second Nicolaisen, Reyan E. ([id number]) o Change dressing every day. Wound #5 Left Toe Great o Change dressing every day. Wound #6 Left Toe Second o Change dressing every day. Wound #8 Left Toe Third o Change dressing every day. Wound #9 Right Toe Third o Change dressing every day. Additional Orders / Instructions Wound #1 Right Toe Great o Stop Smoking o Increase protein intake. o Other: - Please add vitamin A, vitamin C and zinc supplements to your diet Wound #2 Right Toe Second o Stop Smoking o Increase protein intake. o Other: - Please add vitamin A, vitamin C and zinc supplements to your diet Wound #5 Left Toe Great o Stop Smoking o Increase protein intake. o Other: - Please add vitamin A, vitamin C and zinc supplements to your diet Wound #6 Left Toe Second o Stop Smoking o Increase protein intake. o Other: - Please add vitamin A, vitamin C and zinc supplements to your diet Wound #8 Left Toe Third o Stop Smoking o Increase protein intake. o Other: - Please add vitamin A, vitamin C and zinc supplements to your diet Wound #9 Right Toe Third o Stop Smoking o Increase protein intake. o Other: - Please add vitamin A, vitamin C and zinc supplements to your diet Patient Medications Allergies: No Known Drug Allergies Notifications Medication Indication Start End lidocaine DOSE 1 - topical 4 % cream - 1 cream topical Electronic Signature(s) [name] [name] [name] [name] ([id number]) Signed: 04/04/2017 5:09:21 PM By: Christin Fudge MD, FACS Signed: 04/04/2017 5:53:42 PM By: Gretta Cool, BSN, RN, CWS, Kim RN, BSN Entered By: Gretta Cool, BSN, RN, CWS, Kim on 04/04/2017 11:42:50 Jacobowitz, [name] [name] ([id number]) -------------------------------------------------------------------------------- Prescription 04/04/2017 Patient Name: [name] Brittle E. Provider: Christin Fudge MD Date of Birth: 1950-10-05 NPI#:  [id number] Sex: Jerilynn Mages DEA#: [id number] Phone #: [phone] License #: Patient Address: [place]  [place], [place] [address] [address], [place], [place] [address] [phone] Allergies No Known Drug Allergies Medication Medication: Route: Strength: Form: lidocaine topical 4% cream Class: TOPICAL LOCAL ANESTHETICS Dose: Frequency / Time: Indication: 1 1 cream topical Number of Refills: Number of Units: 0 Generic Substitution: Start Date: End Date: Administered at [place]: No Note to Pharmacy: Signature(s): Date(s): Electronic Signature(s) Signed: 04/04/2017 5:09:21 PM By: Christin Fudge MD, FACS Signed: 04/04/2017 5:53:42 PM By: Gretta Cool, BSN, RN, CWS, Kim RN, BSN Entered By: Gretta Cool, BSN, RN, CWS, Kim on 04/04/2017 11:42:53 Bachmann, [name] [name] ([id number]) --------------------------------------------------------------------------------  Problem List Details Patient Name: Moorefield, Parry E. Date of Service: 04/04/2017 11:00 AM Medical Record Number: [id number] Patient Account Number: [id number] Date of Birth/Sex: [birth year] (66 y.o. Male) Treating RN: Montey Hora Primary Care Provider: Myrtie Hawk Other Clinician: Referring Provider: Myrtie Hawk Treating Provider/Extender: Frann Rider in Treatment: 44 Active Problems ICD-10 Encounter Code Description Active Date Diagnosis E11.621 Type 2 diabetes mellitus with foot ulcer 09/21/2016 Yes I87.313 Chronic venous hypertension (idiopathic) with ulcer of bilateral 09/21/2016 Yes lower extremity I89.0 Lymphedema, not elsewhere classified 09/21/2016 Yes L97.522 Non-pressure chronic ulcer of other part of left foot with fat layer 09/21/2016 Yes exposed L97.512 Non-pressure chronic ulcer of other part of right foot with fat layer 09/21/2016 Yes exposed F17.218 Nicotine dependence,  cigarettes, with other nicotine-induced 09/21/2016 Yes disorders F10.19 Alcohol abuse with unspecified alcohol-induced disorder 09/21/2016 Yes M86.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, right ankle and foot 02/21/2017 Yes Inactive Problems Resolved Problems Electronic Signature(s) Signed: 04/04/2017 11:52:37 AM By: Christin Fudge MD, FACS Entered By: Christin Fudge on 04/04/2017 11:52:37 Nie, [name] [name] ([id number]) -------------------------------------------------------------------------------- Progress Note Details Patient Name: Daw, Yahshua E. Date of Service: 04/04/2017 11:00 AM Medical Record Number: [id number] Patient Account Number: [id number] Date of Birth/Sex: 1950-04-14 (66 y.o. Male) Treating RN: Montey Hora Primary Care Provider: Myrtie Hawk Other Clinician: Referring Provider: Myrtie Hawk Treating Provider/Extender: Frann Rider in Treatment: 9 Subjective Chief Complaint Information obtained from Patient Patients presents for treatment of an open diabetic ulcer to both feet History of Present Illness (HPI) The following HPI elements were documented for the patient's wound: Location: bilateral feet ulceration on the toes Quality: Patient reports experiencing a dull pain to affected area(s). Severity: Patient states wound are getting better Duration: Patient has had the wound for > 3 months prior to seeking treatment at the wound center Timing: Pain in wound is constant (hurts all the time) Context: The wound would happen gradually Modifying Factors: Other treatment(s) tried include:treatment for lymphedema and is seen by the podiatrist Dr. Caryl Comes Associated Signs and Symptoms: Patient reports having increase swelling. 66 year old patient here to see as for bilateral feet ulceration to on his left first and second toe and 2 on his right first and second toe, which she's had for about 4 months. 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 Donella Stade. The patient is also undergoing workup by medical oncology for a intra-abdominal lymphadenopathy. 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 He is also status post appendectomy, inguinal lymph node biopsy, rectal biopsy and rectal examination under anesthesia. he currently smokes cigarettes about half packet a day. In March of this year he was seen by Dr. Hortencia Pilar, for  evaluation of bilateral varicose veins and besides wearing compression stockings he had recommended laser ablation of the right and left great saphenous veins to eleviate the symptoms and complications of severe superficial venous reflux disease. He also recommended lymphedema pumps for better control of his lymphedema. The patient recently has had on 08/23/2016, right greater saphenous vein ablation with the laser energy Earlier lower extremity venous reflux examination done on 05/08/2016 showed no DVT or SVT both lower legs but incompetence of bilateral great saphenous veins was present. A lower arterial study was also done and there was no significant right lower and left lower extremity problems based on a normal toe brachial index bilaterally and the ABI was 1.21 the left and 1.23 on the right. His post ablation venous duplex examination showed successful ablation of the right GS vein with thrombus formation 2 below the right saphenofemoral junction. The deep system was patent without evidence of thrombosis and this was done on 08/30/2016. 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 Addendum: regarding his x-rays done today and x-ray of the left foot -- IMPRESSION:  No objective evidence of osteomyelitis. There are soft tissue changes which may reflect cellulitis. X-ray of the right foot -- IMPRESSION:Findings compatible with cellulitis of the toes. No objective evidence of osteomyelitis is observed. 10/01/16 on evaluation today patient's wounds appeared to be doing some better. I did review the x-rays as well which showed no evidence of osteomyelitis although there was evidence on x-ray of cellulitis. He fortunately is not having any discomfort Stanczak, Dameer E. ([id number]) although he continues to have some swelling. He does not remember being on any antibiotics recently. 10/15/16 on evaluation today patient's wounds overall appear to be doing better although he does have a new location noted on the left foot. Fortunately he is not having significant pain. It almost has the appearance that something is rubbing on the end of his toes but he wears the open toe shoes and according to what he is telling me never wears anything that would rub on his foot. There is no evidence of infection and specifically no evidence of a fungal infection 10/22/16 On evaluation today patient's wounds appeared to be doing better compared to last week in regard to his bilateral lower extremities. Fortunately I happy with how things are progressing although he still has ulcers I feel like that he is improving and appropriate manner. 11/12/16 on evaluation today patient appears to be doing well in regard to his bilateral feet and the respective wounds. We have been using surrounding her dressings along with an antifungal cream which seems to be doing very well. He has no bilateral dysfunction noticed that the rituals are weight loss at this point. He also has no nausea or vomiting a note purulent discharge. He did see Vein and vascular today and he tells me that they told him he could have surgery for his venous stasis but they did not feel like it was worth it in his words. Fortunately  patient's wounds do appear to be getting sneakily better. 11/26/2016 -- he says he is going to have some surgery during this week at [facility] for possibly a colon resection. 12/31/2016 -- the patient has been noncompliant with his smoking and I'm not sure whether he is also started drinking again. He continues to be very nonchalant about his care 01/14/2017 -- the patient's HandP has been  reviewed well and I understand he is being compliant with trying to give up smoking and his local dressing changes. He does not have any surgical options of 4 to him by his vascular surgeons.he was last seen in early August by Dr. Hortencia Pilar who recommended compression stockings,and possibly lymph pumps in 2-3 months after doing a review ultrasound. 01/28/2017 - the patient did not have any fresh complaints but on examination I noted a large lacerated wound on the plantar aspect of his right fourth toe which had a lot of necrotic debris and it probes down to bone. 02/07/2017 -- x-ray of the right foot -- IMPRESSION: Soft tissue swelling about the first through fourth toes consistent with cellulitis. New destructive change in the tuft of the distal phalanx of the great toe is consistent with osteomyelitis. 02/14/2017 -- the patient's MRI is pending this coming Monday and he still continues to smoke. We have again gone over off loading of his wounds in great detail and he says he's been compliant. 02/21/2017 -- MR of the right foot -- IMPRESSION: 1. Soft tissue ulcer at the tip of the first, second and third toe knows. Cortical irregularity and bone marrow edema in the first distal phalanx most concerning for osteomyelitis. Mild marrow edema in the second and third distal phalanx without definite cortical destruction which may reflect early osteomyelitis versus reactive marrow edema. 2. Soft tissue edema surrounding the first phalanx most consistent with cellulitis. the patient was also recently evaluated  by his medical oncologist Dr. 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 She is treating him with weekly Procrit. She is also keeping intra-abdominal lymphadenopathy and right lower lobe lung nodule under observation. 04/04/2017 -- he was seen by Dr. Adrian Prows on 03/25/2017 -- after review he empirically put him on ciprofloxacin and doxycycline as they have good bone penetration and good bioavailability and it will cover the usual pathogens and diabetic foot osteomyelitis. He will check inflammatory markers and plan a 64-64 week old records. C-reactive protein was 0.3 and the ESR was 72 Patient History Information obtained from Patient. Social History Current every day smoker, Marital Status - Widowed, Alcohol Use - Daily - quit drinking about a week ago, Drug Use - No History, Caffeine Use - Moderate. 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 Respirations normal and unlabored. Afebrile. Vitals Time Taken: 11:10 AM, Height: 69 in, Weight: 168 lbs, BMI: 24.8, Temperature: 98.0 F, Pulse: 69 bpm, Respiratory Rate: 16 breaths/min, Blood Pressure: 150/83 mmHg. Eyes Nonicteric. Reactive to light. Ears, Nose, Mouth, and Throat Lips, teeth, and gums WNL.Marland Kitchen Moist mucosa without lesions. Neck supple and nontender. No palpable supraclavicular or cervical adenopathy. Normal sized without goiter. Respiratory WNL. No retractions.. Cardiovascular Pedal Pulses WNL. No clubbing, cyanosis or edema. Chest Breasts symmetical and no nipple discharge.. Breast tissue WNL, no masses, lumps, or tenderness.. Lymphatic No adneopathy. No adenopathy. No adenopathy. Musculoskeletal Adexa without tenderness or enlargement.. Digits and nails w/o clubbing, cyanosis, infection, petechiae, ischemia,  or inflammatory conditions.Marland Kitchen Psychiatric Judgement and insight Intact.. No evidence of depression, anxiety, or agitation.. General Notes: the wounds are looking about the same and there are no fresh wounds nor is there any palpable or probable bone. His fungal infection is overall looking better Integumentary (Hair, Skin) No suspicious lesions. No crepitus or fluctuance. No peri-wound warmth or erythema.  No masses.. Wound #1 status is Open. Original cause of wound was Gradually Appeared. The wound is located on the Right Toe Great. The wound measures 1.7cm length x 6.5cm width x 0.1cm depth; 8.679cm^2 area and 0.868cm^3 volume. There is Fat Layer (Subcutaneous Tissue) Exposed exposed. There is no tunneling or undermining noted. There is a large amount of Enzor, Pamela E. ([id number]) serosanguineous drainage noted. The wound margin is flat and intact. There is medium (34-66%) pink granulation within the wound bed. There is a medium (34-66%) amount of necrotic tissue within the wound bed including Adherent Slough. The periwound skin appearance exhibited: Excoriation, Maceration. The periwound skin appearance did not exhibit: Callus, Crepitus, Induration, Rash, Scarring, Dry/Scaly, Atrophie Blanche, Cyanosis, Ecchymosis, Hemosiderin Staining, Mottled, Pallor, Rubor, Erythema. Periwound temperature was noted as No Abnormality. Wound #2 status is Open. Original cause of wound was Gradually Appeared. The wound is located on the Right Toe Second. The wound measures 2.1cm length x 2.5cm width x 0.1cm depth; 4.123cm^2 area and 0.412cm^3 volume. Wound #5 status is Open. Original cause of wound was Gradually Appeared. The wound is located on the Left Toe Great. The wound measures 3cm length x 3.2cm width x 0.1cm depth; 7.54cm^2 area and 0.754cm^3 volume. There is Fat Layer (Subcutaneous Tissue) Exposed exposed. There is no tunneling or undermining noted. There is a large amount of serous drainage noted.  Foul odor after cleansing was noted. The wound margin is flat and intact. There is medium (34-66%) pink granulation within the wound bed. There is a medium (34-66%) amount of necrotic tissue within the wound bed including Adherent Slough. The periwound skin appearance exhibited: Excoriation, Maceration. The periwound skin appearance did not exhibit: Callus, Crepitus, Induration, Rash, Scarring, Dry/Scaly, Atrophie Blanche, Cyanosis, Ecchymosis, Hemosiderin Staining, Mottled, Pallor, Rubor, Erythema. Periwound temperature was noted as No Abnormality. Wound #6 status is Open. Original cause of wound was Gradually Appeared. The wound is located on the Left Toe Second. The wound measures 0.2cm length x 0.6cm width x 0.1cm depth; 0.094cm^2 area and 0.009cm^3 volume. There is Fat Layer (Subcutaneous Tissue) Exposed exposed. There is no tunneling or undermining noted. There is a large amount of serous drainage noted. The wound margin is flat and intact. There is medium (34-66%) pink granulation within the wound bed. There is a medium (34-66%) amount of necrotic tissue within the wound bed including Adherent Slough. The periwound skin appearance exhibited: Excoriation, Maceration. The periwound skin appearance did not exhibit: Callus, Crepitus, Induration, Rash, Scarring, Dry/Scaly, Atrophie Blanche, Cyanosis, Ecchymosis, Hemosiderin Staining, Mottled, Pallor, Rubor, Erythema. Periwound temperature was noted as No Abnormality. Wound #8 status is Open. Original cause of wound was Gradually Appeared. The wound is located on the Left Toe Third. The wound measures 0.4cm length x 0.7cm width x 0.1cm depth; 0.22cm^2 area and 0.022cm^3 volume. There is no tunneling or undermining noted. There is a large amount of serosanguineous drainage noted. The wound margin is distinct with the outline attached to the wound base. There is medium (34-66%) red granulation within the wound bed. There is a medium (34-66%) amount  of necrotic tissue within the wound bed including Adherent Slough. The periwound skin appearance exhibited: Maceration. Periwound temperature was noted as No Abnormality. The periwound has tenderness on palpation. Wound #9 status is Open. Original cause of wound was Gradually Appeared. The wound is located on the Right Toe Third. The wound measures 1cm length x 0.7cm width x 0.1cm depth; 0.55cm^2 area and 0.055cm^3 volume. There is no tunneling or undermining noted. There  is a large amount of serous drainage noted. Foul odor after cleansing was noted. The wound margin is flat and intact. There is medium (34-66%) pink granulation within the wound bed. There is a medium (34-66%) amount of necrotic tissue within the wound bed including Adherent Slough. The periwound skin appearance exhibited: Maceration. The periwound skin appearance did not exhibit: Callus, Crepitus, Excoriation, Induration, Rash, Scarring, Dry/Scaly, Atrophie Blanche, Cyanosis, Ecchymosis, Hemosiderin Staining, Mottled, Pallor, Rubor, Erythema. Periwound temperature was noted as No Abnormality. 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 ([id number]) Plan Wound Cleansing: Wound #1 Right Toe Great: Clean wound with Normal Saline. May Shower, gently pat wound dry prior to applying new dressing. Wound #2 Right Toe Second: Clean wound with Normal Saline. May  Shower, gently pat wound dry prior to applying new dressing. Wound #5 Left Toe Great: Clean wound with Normal Saline. May Shower, gently pat wound dry prior to applying new dressing. Wound #6 Left Toe Second: Clean wound with Normal Saline. May Shower, gently pat wound dry prior to applying new dressing. Wound #8 Left Toe Third: Clean wound with Normal Saline. May Shower, gently pat wound dry prior to applying new dressing. Wound #9 Right Toe Third: Clean wound with Normal Saline. May Shower, gently pat wound dry prior to applying new dressing. Anesthetic (add to Medication List): Wound #1 Right Toe Great: Topical Lidocaine 4% cream applied to wound bed prior to debridement (In Clinic Only). Wound #5 Left Toe Great: Topical Lidocaine 4% cream applied to wound bed prior to debridement (In Clinic Only). Skin Barriers/Peri-Wound Care: Wound #1 Right Toe Great: Antifungal cream Wound #2 Right Toe Second: Antifungal cream Wound #5 Left Toe Great: Antifungal cream Wound #6 Left Toe Second: Antifungal cream Wound #8 Left Toe Third: Antifungal cream Wound #9 Right Toe Third: Antifungal cream Primary Wound Dressing: Wound #1 Right Toe Great: Silvercel Non-Adherent Other: - nystatin Wound #2 Right Toe Second: Silvercel Non-Adherent Other: - nystatin Wound #5 Left Toe Great: Silvercel Non-Adherent Other: - nystatin Wound #6 Left Toe Second: Silvercel Non-Adherent Other: - nystatin Wound #8 Left Toe Third: ANDERS, HOHMANN ([id number]) Silvercel Non-Adherent Other: - nystatin Wound #9 Right Toe Third: Silvercel Non-Adherent Other: - nystatin Secondary Dressing: Wound #1 Right Toe Great: Gauze and Kerlix/Conform Wound #2 Right Toe Second: Gauze and Kerlix/Conform Wound #5 Left Toe Great: Gauze and Kerlix/Conform Wound #6 Left Toe Second: Gauze and Kerlix/Conform Wound #8 Left Toe Third: Gauze and Kerlix/Conform Wound #9 Right Toe Third: Gauze and  Kerlix/Conform Dressing Change Frequency: Wound #1 Right Toe Great: Change dressing every day. Wound #2 Right Toe Second: Change dressing every day. Wound #5 Left Toe Great: Change dressing every day. Wound #6 Left Toe Second: Change dressing every day. Wound #8 Left Toe Third: Change dressing every day. Wound #9 Right Toe Third: Change dressing every day. Additional Orders / Instructions: Wound #1 Right Toe Great: Stop Smoking Increase protein intake. Other: - Please add vitamin A, vitamin C and zinc supplements to your diet Wound #2 Right Toe  Second: Stop Smoking Increase protein intake. Other: - Please add vitamin A, vitamin C and zinc supplements to your diet Wound #5 Left Toe Great: Stop Smoking Increase protein intake. Other: - Please add vitamin A, vitamin C and zinc supplements to your diet Wound #6 Left Toe Second: Stop Smoking Increase protein intake. Other: - Please add vitamin A, vitamin C and zinc supplements to your diet Wound #8 Left Toe Third: Stop Smoking Increase protein intake. Other: - Please add vitamin A, vitamin C and zinc supplements to your diet Wound #9 Right Toe Third: Stop Smoking Increase protein intake. Other: - Please add vitamin A, vitamin C and zinc supplements to your diet The following medication(s) was prescribed: lidocaine topical 4 % cream 1 1 cream topical Corral, [name] E. ([id number]) he has had his appointment with Dr. Ola Spurr and this was on December 17. I have noted the details and notes that he is on oral antibiotics since December 17 In the meanwhile, I have recommended: 1. packing this with Silver alginate and an appropriate foam off loading. 2. I have encouraged him to wear his compression stockings previously advised 3. adequate protein, vitamin A, vitamin C and zinc 4. Completely give up smoking. 66 year old diabetic who has had progressive problems with nonhealing wounds of his feet, in spite of every good effort  and appropriate treatment. He now has developed chronic osteomyelitis of his right foot and this will need prolonged oral antibiotic treatment as per infectious disease. After multidisciplinary treatment, I believe he will strongly benefit from hyperbaric oxygen therapy and I have discussed this at great length with the patient and his brother-in-law who is at the bedside. Due to socio- economic reasons, they may not be able to come, but they are going to look into it and let us know if it is going to be possible to treat him with hyperbaric oxygen therapy over the course of 40 treatments. The risks, benefits, alternatives and all the possible benefits and complications have been discussed with him including hypoglycemia, vision changes, remote possibility of seizure activity and problems with his tympanic membrane. They have had all questions answered and will let us know if management with hyperbaric oxygen therapy is going to be an option they would like to pursue. Electronic Signature(s) Signed: 04/04/2017 11:59:42 AM By: Christin Fudge MD, FACS Entered By: Christin Fudge on 04/04/2017 11:59:42 Stolze, [name] [name] ([id number]) -------------------------------------------------------------------------------- ROS/PFSH Details Patient Name: Glasner, Jermey E. Date of Service: 04/04/2017 11:00 AM Medical Record Number: [id number] Patient Account Number: [id number] Date of Birth/Sex: 01-24-1951 (66 y.o. Male) Treating RN: Montey Hora Primary Care Provider: Myrtie Hawk Other Clinician: Referring Provider: Myrtie Hawk Treating Provider/Extender: Frann Rider in Treatment: 64 Information Obtained From Patient Wound History Do you currently have one or more open woundso Yes How many open wounds do you currently haveo 6 Approximately how long have you had your woundso 3 months How have you been treating your wound(s) until nowo ointment and bandage Has your wound(s) ever  healed and then re-openedo No Have you had any lab work done in the past montho No Have you tested positive for an antibiotic resistant organism (MRSA, VRE)o No Have you tested positive for osteomyelitis (bone infection)o No Have you had any tests for circulation on your legso Yes Who ordered the testo PCP Where was the test doneo [facility] Medical History: Negative for: Cataracts; Glaucoma; Optic Neuritis Ear/Nose/Mouth/Throat Medical History: Negative for: Chronic sinus problems/congestion; Middle ear problems Hematologic/Lymphatic Medical History: Positive for: Anemia;  Lymphedema Negative for: Hemophilia; Human Immunodeficiency Virus; Sickle Cell Disease Respiratory Medical History: Negative for: Aspiration; Asthma; Chronic Obstructive Pulmonary Disease (COPD); Pneumothorax; Sleep Apnea; Tuberculosis Cardiovascular Medical History: Positive for: Congestive Heart Failure; Hypertension; Peripheral Venous Disease Negative for: Angina; Arrhythmia; Coronary Artery Disease; Deep Vein Thrombosis; Hypotension; Myocardial Infarction; Peripheral Arterial Disease; Phlebitis; Vasculitis Gastrointestinal Medical History: Negative for: Cirrhosis ; Colitis; Crohnos; Hepatitis A; Hepatitis B; Hepatitis C Hopkinson, [name] E. ([id number]) Endocrine Medical History: Positive for: Type II Diabetes Treated with: Oral agents Blood sugar tested every day: Yes Tested : QD Genitourinary Medical History: Negative for: End Stage Renal Disease Immunological Medical History: Negative for: Lupus Erythematosus; Raynaudos; Scleroderma Integumentary (Skin) Medical History: Negative for: History of Burn; History of pressure wounds Musculoskeletal Medical History: Negative for: Gout; Rheumatoid Arthritis; Osteoarthritis; Osteomyelitis Neurologic Medical History: Positive for: Neuropathy Negative for: Dementia; Quadriplegia; Paraplegia; Seizure Disorder Oncologic Medical History: Past Medical History  Notes: 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 Electronic Signature(s) Signed: 04/04/2017 4:29:35 PM By: Montey Hora Signed: 04/04/2017 5:09:21 PM By: Christin Fudge MD, FACS NICHALOS, BRENTON ([id number]) Entered By: Christin Fudge on 04/04/2017 11:53:13 Pitter, [name] [name] ([id number]) -------------------------------------------------------------------------------- SuperBill Details Patient Name: Quilter, Levoy E. Date of Service: 04/04/2017 Medical Record Number: [id number] Patient Account Number: [id number] Date of Birth/Sex: [birth year] (66 y.o. Male) Treating RN: Montey Hora Primary Care Provider: Myrtie Hawk Other Clinician: Referring Provider: Myrtie Hawk Treating Provider/Extender: Frann Rider in Treatment: 27 Diagnosis Coding ICD-10 Codes Code Description E11.621 Type 2 diabetes mellitus with foot ulcer I87.313 Chronic venous hypertension (idiopathic) with ulcer of bilateral lower extremity I89.0 Lymphedema, not elsewhere classified L97.522 Non-pressure chronic ulcer of other part of left foot with fat layer exposed L97.512 Non-pressure chronic ulcer of other part of right foot with fat layer exposed F17.218 Nicotine dependence, cigarettes, 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 abuse 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 Chronic  multifocal osteomyelitis, right ankle and foot Facility Procedures CPT4 Code: [id number] Description: 27517 - WOUND CARE VISIT-LEV 5 EST PT Modifier: Quantity: 1 Physician Procedures CPT4 Code Description: [id number] - WC PHYS LEVEL 3 - EST PT ICD-10 Diagnosis Description E11.621 Type 2 diabetes mellitus with foot ulcer I87.313 Chronic venous hypertension (idiopathic) with ulcer of bilateral I89.0 Lymphedema, not elsewhere  classified M86.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, right ankle and foot Modifier: lower extremi Quantity: 1 ty Electronic Signature(s) Signed: 04/04/2017 1:28:33 PM By: Montey Hora Signed: 04/04/2017 5:09:21 PM By: Christin Fudge MD, FACS Previous Signature: 04/04/2017 12:00:00 PM Version By: Christin Fudge MD, FACS Entered By: Montey Hora on 04/04/2017 13:28:32

## 2017-04-06 NOTE — Progress Notes (Signed)
[name] ([id number]) Visit Report for 04/04/2017 Arrival Information Details Patient Name: [name] Date of Service: 04/04/2017 11:00 AM Medical Record Number: [id number] Patient Account Number: [id number] Date of Birth/Sex: [birth year] (66 y.o. Male) Treating RN: Cornell Barman Primary Care Tawan Corkern: Myrtie Hawk Other Clinician: Referring Judson Tsan: Myrtie Hawk Treating Kamila Broda/Extender: Frann Rider in Treatment: 106 Visit Information History Since Last Visit Added or deleted any medications: No Patient Arrived: Ambulatory Any new allergies or adverse reactions: No Arrival Time: 11:08 Had a fall or experienced change in No Accompanied By: brother in activities of daily living that may affect law risk of falls: Transfer Assistance: Manual Signs or symptoms of abuse/neglect since last visito No Patient Identification Verified: Yes Hospitalized since last visit: No Secondary Verification Process Completed: Yes Has Dressing in Place as Prescribed: Yes Patient Requires Transmission-Based No Pain Present Now: No Precautions: Patient Has Alerts: Yes Patient Alerts: DMII Electronic Signature(s) Signed: 04/04/2017 5:53:42 PM By: Gretta Cool, BSN, RN, CWS, Kim RN, BSN Entered By: Gretta Cool, BSN, RN, CWS, Kim on 04/04/2017 11:09:36 Wasko, [name] ([id number]) -------------------------------------------------------------------------------- Clinic Level of Care Assessment Details Patient Name: Mellin, Phong E. Date of Service: 04/04/2017 11:00 AM Medical Record Number: [id number] Patient Account Number: [id number] Date of Birth/Sex: [birth year] (66 y.o. Male) Treating RN: Montey Hora Primary Care Rexford Prevo: Myrtie Hawk Other Clinician: Referring Claudis Giovanelli: Myrtie Hawk Treating Mekaila Tarnow/Extender: Frann Rider in Treatment: 27 Clinic Level of Care Assessment Items TOOL 4 Quantity Score []  - Use when only an EandM is performed on  FOLLOW-UP visit 0 ASSESSMENTS - Nursing Assessment / Reassessment X - Reassessment of Co-morbidities (includes updates in patient status) 1 10 X- 1 5 Reassessment of Adherence to Treatment Plan ASSESSMENTS - Wound and Skin Assessment / Reassessment []  - Simple Wound Assessment / Reassessment - one wound 0 X- 6 5 Complex Wound Assessment / Reassessment - multiple wounds []  - 0 Dermatologic / Skin Assessment (not related to wound area) ASSESSMENTS - Focused Assessment []  - Circumferential Edema Measurements - multi extremities 0 []  - 0 Nutritional Assessment / Counseling / Intervention X- 1 5 Lower Extremity Assessment (monofilament, tuning fork, pulses) []  - 0 Peripheral Arterial Disease Assessment (using hand held doppler) ASSESSMENTS - Ostomy and/or Continence Assessment and Care []  - Incontinence Assessment and Management 0 []  - 0 Ostomy Care Assessment and Management (repouching, etc.) PROCESS - Coordination of Care X - Simple Patient / Family Education for ongoing care 1 15 []  - 0 Complex (extensive) Patient / Family Education for ongoing care []  - 0 Staff obtains Programmer, systems, Records, Test Results / Process Orders []  - 0 Staff telephones HHA, Nursing Homes / Clarify orders / etc []  - 0 Routine Transfer to another Facility (non-emergent condition) []  - 0 Routine Hospital Admission (non-emergent condition) []  - 0 New Admissions / Biomedical engineer / Ordering NPWT, Apligraf, etc. []  - 0 Emergency Hospital Admission (emergent condition) X- 1 10 Simple Discharge Coordination Politte, Gaspar E. ([id number]) []  - 0 Complex (extensive) Discharge Coordination PROCESS - Special Needs []  - Pediatric / Minor Patient Management 0 []  - 0 Isolation Patient Management []  - 0 Hearing / Language / Visual special needs []  - 0 Assessment of Community assistance (transportation, D/C planning, etc.) []  - 0 Additional assistance / Altered mentation []  - 0 Support Surface(s)  Assessment (bed, cushion, seat, etc.) INTERVENTIONS - Wound Cleansing / Measurement []  - Simple Wound Cleansing - one wound 0 X- 6 5 Complex Wound Cleansing - multiple wounds X- 1 5 Wound Imaging (photographs -  any number of wounds) []  - 0 Wound Tracing (instead of photographs) []  - 0 Simple Wound Measurement - one wound X- 6 5 Complex Wound Measurement - multiple wounds INTERVENTIONS - Wound Dressings X - Small Wound Dressing one or multiple wounds 6 10 []  - 0 Medium Wound Dressing one or multiple wounds []  - 0 Large Wound Dressing one or multiple wounds X- 1 5 Application of Medications - topical []  - 0 Application of Medications - injection INTERVENTIONS - Miscellaneous []  - External ear exam 0 []  - 0 Specimen Collection (cultures, biopsies, blood, body fluids, etc.) []  - 0 Specimen(s) / Culture(s) sent or taken to Lab for analysis []  - 0 Patient Transfer (multiple staff / Civil Service fast streamer / Similar devices) []  - 0 Simple Staple / Suture removal (25 or less) []  - 0 Complex Staple / Suture removal (26 or more) []  - 0 Hypo / Hyperglycemic Management (close monitor of Blood Glucose) []  - 0 Ankle / Brachial Index (ABI) - do not check if billed separately X- 1 5 Vital Signs Stock, Lynton E. ([id number]) Has the patient been seen at the hospital within the last three years: Yes Total Score: 210 Level Of Care: New/Established - Level 5 Electronic Signature(s) Signed: 04/04/2017 4:29:35 PM By: Montey Hora Entered By: Montey Hora on 04/04/2017 13:28:20 Pekar, [name] ([id number]) -------------------------------------------------------------------------------- Encounter Discharge Information Details Patient Name: Bogie, Eliel E. Date of Service: 04/04/2017 11:00 AM Medical Record Number: [id number] Patient Account Number: [id number] Date of Birth/Sex: [birth year] (66 y.o. Male) Treating RN: Cornell Barman Primary Care Kaicen Desena: Myrtie Hawk Other  Clinician: Referring Tyrel Lex: Myrtie Hawk Treating Venora Kautzman/Extender: Frann Rider in Treatment: 36 Encounter Discharge Information Items Discharge Pain Level: 0 Discharge Condition: Stable Ambulatory Status: Ambulatory Discharge Destination: Home Transportation: Private Auto Accompanied By: family Schedule Follow-up Appointment: Yes Medication Reconciliation completed and No provided to Patient/Care Eitan Doubleday: Provided on Clinical Summary of Care: 04/04/2017 Form Type Recipient Paper Patient ED Electronic Signature(s) Signed: 04/05/2017 2:37:55 PM By: Ruthine Dose Entered By: Ruthine Dose on 04/04/2017 11:54:21 Appelbaum, [name] ([id number]) -------------------------------------------------------------------------------- Lower Extremity Assessment Details Patient Name: Bartleson, Raymir E. Date of Service: 04/04/2017 11:00 AM Medical Record Number: [id number] Patient Account Number: [id number] Date of Birth/Sex: Sep 30, 1950 (66 y.o. Male) Treating RN: Cornell Barman Primary Care Jarvis Sawa: Myrtie Hawk Other Clinician: Referring Corgan Mormile: Myrtie Hawk Treating Jaunita Mikels/Extender: Frann Rider in Treatment: 27 Vascular Assessment Pulses: Dorsalis Pedis Palpable: [Left:Yes] [Right:Yes] Posterior Tibial Extremity colors, hair growth, and conditions: Extremity Color: [Left:Hyperpigmented] [Right:Hyperpigmented] Temperature of Extremity: [Left:Warm] [Right:Warm] Capillary Refill: [Left:> 3 seconds] [Right:> 3 seconds] Electronic Signature(s) Signed: 04/04/2017 5:53:42 PM By: Gretta Cool, BSN, RN, CWS, Kim RN, BSN Entered By: Gretta Cool, BSN, RN, CWS, Kim on 04/04/2017 11:31:48 Shawler, [name] ([id number]) -------------------------------------------------------------------------------- Multi Wound Chart Details Patient Name: Sherrill, Rakwon E. Date of Service: 04/04/2017 11:00 AM Medical Record Number: [id number] Patient Account Number: [id number] Date of  Birth/Sex: March 01, 1951 (66 y.o. Male) Treating RN: Cornell Barman Primary Care Linas Stepter: Myrtie Hawk Other Clinician: Referring Katherleen Folkes: Myrtie Hawk Treating Valeska Haislip/Extender: Frann Rider in Treatment: 27 Vital Signs Height(in): 2 Pulse(bpm): 67 Weight(lbs): 168 Blood Pressure(mmHg): 150/83 Body Mass Index(BMI): 25 Temperature(F): 98.0 Respiratory Rate 16 (breaths/min): Photos: [1:No Photos] [2:No Photos] [5:No Photos] Wound Location: [1:Right Toe Great] [2:Right Toe Second] [5:Left Toe Great] Wounding Event: [1:Gradually Appeared] [2:Gradually Appeared] [5:Gradually Appeared] Primary Etiology: [1:Diabetic Wound/Ulcer of the Lower Extremity] [2:Diabetic Wound/Ulcer of the Lower Extremity] [5:Diabetic Wound/Ulcer of the Lower Extremity] Comorbid History: [1:Anemia, Lymphedema, Congestive Heart Failure, Hypertension, Peripheral Venous  Disease, Type II Diabetes, Neuropathy] [2:N/A] [5:Anemia, Lymphedema, Congestive Heart Failure, Hypertension, Peripheral Venous Disease, Type II Diabetes,  Neuropathy] Date Acquired: [1:06/11/2016] [2:06/11/2016] [5:06/11/2016] Weeks of Treatment: [1:27] [2:27] [5:27] Wound Status: [1:Open] [2:Open] [5:Open] Pending Amputation on [1:Yes] [2:Yes] [5:Yes] Presentation: Measurements L x W x D [1:1.7x6.5x0.1] [2:2.1x2.5x0.1] [5:3x3.2x0.1] (cm) Area (cm) : [1:8.679] [2:4.123] [5:7.54] Volume (cm) : [1:0.868] [2:0.412] [5:0.754] % Reduction in Area: [1:81.60%] [2:-34.30%] [5:47.30%] % Reduction in Volume: [1:81.60%] [2:-34.20%] [5:47.20%] Classification: [1:Grade 1] [2:Grade 1] [5:Grade 1] Exudate Amount: [1:Large] [2:N/A] [5:Large] Exudate Type: [1:Serosanguineous] [2:N/A] [5:Serous] Exudate Color: [1:red, brown] [2:N/A] [5:amber] Foul Odor After Cleansing: [1:No] [2:N/A] [5:Yes] Odor Anticipated Due to [1:N/A] [2:N/A] [5:No] Product Use: Wound Margin: [1:Flat and Intact] [2:N/A] [5:Flat and Intact] Granulation Amount:  [1:Medium (34-66%)] [2:N/A] [5:Medium (34-66%)] Granulation Quality: [1:Pink] [2:N/A] [5:Pink] Necrotic Amount: [1:Medium (34-66%)] [2:N/A] [5:Medium (34-66%)] Exposed Structures: [1:Fat Layer (Subcutaneous Tissue) Exposed: Yes Fascia: No Tendon: No Muscle: No] [2:N/A] [5:Fat Layer (Subcutaneous Tissue) Exposed: Yes Fascia: No Tendon: No Muscle: No] Joint: No Joint: No Bone: No Bone: No Epithelialization: Small (1-33%) N/A Small (1-33%) Periwound Skin Texture: Excoriation: Yes No Abnormalities Noted Excoriation: Yes Induration: No Induration: No Callus: No Callus: No Crepitus: No Crepitus: No Rash: No Rash: No Scarring: No Scarring: No Periwound Skin Moisture: Maceration: Yes No Abnormalities Noted Maceration: Yes Dry/Scaly: No Dry/Scaly: No Periwound Skin Color: Atrophie Blanche: No No Abnormalities Noted Atrophie Blanche: No Cyanosis: No Cyanosis: No Ecchymosis: No Ecchymosis: No Erythema: No Erythema: No Hemosiderin Staining: No Hemosiderin Staining: No Mottled: No Mottled: No Pallor: No Pallor: No Rubor: No Rubor: No Temperature: No Abnormality N/A No Abnormality Tenderness on Palpation: No No No Wound Preparation: Ulcer Cleansing: N/A Ulcer Cleansing: Rinsed/Irrigated with Saline Rinsed/Irrigated with Saline Topical Anesthetic Applied: None Wound Number: [id number]  Photos: No Photos No Photos No Photos Wound Location: Left Toe Second Left Toe Third Right Toe Third Wounding Event: Gradually Appeared Gradually Appeared Gradually Appeared Primary Etiology: Diabetic Wound/Ulcer of 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 Lower Extremity Lower Extremity Comorbid History: Anemia, Lymphedema, Anemia, Lymphedema, Anemia, Lymphedema, Congestive Heart Failure, Congestive Heart Failure, Congestive Heart Failure, Hypertension, Peripheral Hypertension, Peripheral Hypertension, Peripheral Venous Disease, Type II Venous Disease, Type II  Venous Disease, Type II Diabetes, Neuropathy Diabetes, Neuropathy Diabetes, Neuropathy Date Acquired: 06/11/2016 12/30/2016 12/31/2016 Weeks of Treatment: 27 13 13  Wound Status: Open Open Open Pending Amputation on Yes No No Presentation: Measurements L x W x D 0.2x0.6x0.1 0.4x0.7x0.1 1x0.7x0.1 (cm) Area (cm) : 0.094 0.22 0.55 Volume (cm) : 0.009 0.022 0.055 % Reduction in Area: 95.60% 72.50% -39.90% % Reduction in Volume: 95.80% 72.50% -41.00% Classification: Grade 1 Grade 1 Grade 1 Exudate Amount: Large Large Large Exudate Type: Serous Serosanguineous Serous Exudate Color: amber red, brown amber Foul Odor After Cleansing: No No Yes Odor Anticipated Due to N/A N/A No Product Use: Wound Margin: Flat and Intact Distinct, outline attached Flat and Intact Granulation Amount: Medium (34-66%) Medium (34-66%) Medium (34-66%) Langenfeld, Dakarai E. ([id number]) Granulation Quality: Pink Red Pink Necrotic Amount: Medium (34-66%) Medium (34-66%) Medium (34-66%) Exposed Structures: Fat Layer (Subcutaneous N/A Fascia: No Tissue) Exposed: Yes Fat Layer (Subcutaneous Fascia: No Tissue) Exposed: No Tendon: No Tendon: No Muscle: No Muscle: No Joint: No Joint: No Bone: No Bone: No Epithelialization: Small (1-33%) None Medium (34-66%) Periwound Skin Texture: Excoriation: Yes No Abnormalities Noted Excoriation: No Induration: No Induration: No Callus: No Callus: No Crepitus: No Crepitus: No Rash: No Rash: No Scarring: No Scarring:  No Periwound Skin Moisture: Maceration: Yes Maceration: Yes Maceration: Yes Dry/Scaly: No Dry/Scaly: No Periwound Skin Color: Atrophie Blanche: No No Abnormalities Noted Atrophie Blanche: No Cyanosis: No Cyanosis: No Ecchymosis: No Ecchymosis: No Erythema: No Erythema: No Hemosiderin Staining: No Hemosiderin Staining: No Mottled: No Mottled: No Pallor: No Pallor: No Rubor: No Rubor: No Temperature: No Abnormality No Abnormality No  Abnormality Tenderness on Palpation: No Yes No Wound Preparation: Ulcer Cleansing: Ulcer Cleansing: Ulcer Cleansing: Rinsed/Irrigated with Saline Rinsed/Irrigated with Saline Rinsed/Irrigated with Saline Treatment Notes Wound #1 (Right Toe Great) 1. Cleansed with: Clean wound with Normal Saline 2. Anesthetic Topical Lidocaine 4% cream to wound bed prior to debridement 3. Peri-wound Care: Antifungal cream 4. Dressing Applied: Other dressing (specify in notes) 5. Secondary Dressing Applied Dry Gauze Kerlix/Conform 7. Secured with Tape Notes silvercel Wound #2 (Right Toe Second) 1. Cleansed with: Clean wound with Normal Saline 2. Anesthetic Topical Lidocaine 4% cream to wound bed prior to debridement 3. Peri-wound Care: EIVIN, MASCIO. ([id number]) Antifungal cream 4. Dressing Applied: Other dressing (specify in notes) 5. Secondary Dressing Applied Dry Gauze Kerlix/Conform 7. Secured with Tape Notes silvercel Wound #5 (Left Toe Great) 1. Cleansed with: Clean wound with Normal Saline 2. Anesthetic Topical Lidocaine 4% cream to wound bed prior to debridement 3. Peri-wound Care: Antifungal cream 4. Dressing Applied: Other dressing (specify in notes) 5. Secondary Dressing Applied Dry Gauze Kerlix/Conform 7. Secured with Tape Notes silvercel Wound #6 (Left Toe Second) 1. Cleansed with: Clean wound with Normal Saline 2. Anesthetic Topical Lidocaine 4% cream to wound bed prior to debridement 3. Peri-wound Care: Antifungal cream 4. Dressing Applied: Other dressing (specify in notes) 5. Secondary Dressing Applied Dry Gauze Kerlix/Conform 7. Secured with Tape Notes silvercel Wound #8 (Left Toe Third) 1. Cleansed with: Clean wound with Normal Saline 2. Anesthetic Topical Lidocaine 4% cream to wound bed prior to debridement 3. Peri-wound Care: Antifungal cream Avakian, Westlee E. ([id number]) 4. Dressing Applied: Other dressing (specify in notes) 5.  Secondary Dressing Applied Dry Gauze Kerlix/Conform 7. Secured with Tape Notes silvercel Wound #9 (Right Toe Third) 1. Cleansed with: Clean wound with Normal Saline 2. Anesthetic Topical Lidocaine 4% cream to wound bed prior to debridement 3. Peri-wound Care: Antifungal cream 4. Dressing Applied: Other dressing (specify in notes) 5. Secondary Dressing Applied Dry Gauze Kerlix/Conform 7. Secured with Tape Notes silvercel Electronic Signature(s) Signed: 04/04/2017 11:52:45 AM By: Christin Fudge MD, FACS Entered By: Christin Fudge on 04/04/2017 11:52:45 Hainsworth, [name] ([id number]) -------------------------------------------------------------------------------- Opp Details Patient Name: Marcantonio, Eyob E. Date of Service: 04/04/2017 11:00 AM Medical Record Number: [id number] Patient Account Number: [id number] Date of Birth/Sex: [birth year] (66 y.o. Male) Treating RN: Cornell Barman Primary Care Trust Crago: Myrtie Hawk Other Clinician: Referring Shaday Rayborn: Myrtie Hawk Treating Kimmarie Pascale/Extender: Frann Rider in Treatment: 48 Active Inactive ` Abuse / Safety / Falls / Self Care Management Nursing Diagnoses: Potential for falls Goals: Patient will remain injury free related to falls Date Initiated: 09/21/2016 Target Resolution Date: 11/30/2016 Goal Status: Active Interventions: Assess fall risk on admission and as needed Notes: ` Nutrition Nursing Diagnoses: Potential for alteratiion in Nutrition/Potential for imbalanced nutrition Goals: Patient/caregiver agrees to and verbalizes understanding of need to use nutritional supplements and/or vitamins as prescribed Date Initiated: 09/21/2016 Target Resolution Date: 11/30/2016 Goal Status: Active Interventions: Assess patient nutrition upon admission and as needed per policy Notes: ` Orientation to the Wound Care Program Nursing Diagnoses: Knowledge deficit related to the wound  healing center program Goals: Patient/caregiver will verbalize  understanding of the [place] Program Date Initiated: 09/21/2016 Target Resolution Date: 11/30/2016 Goal Status: Active Interventions: Blankenburg, IKE MARAGH ([id number]) Provide education on orientation to the wound center Notes: ` Wound/Skin Impairment Nursing Diagnoses: Knowledge deficit related to smoking impact on wound healing Goals: Ulcer/skin breakdown will have a volume reduction of 30% by week 4 Date Initiated: 09/21/2016 Target Resolution Date: 11/30/2016 Goal Status: Active Ulcer/skin breakdown will have a volume reduction of 50% by week 8 Date Initiated: 09/21/2016 Target Resolution Date: 11/30/2016 Goal Status: Active Ulcer/skin breakdown will have a volume reduction of 80% by week 12 Date Initiated: 09/21/2016 Target Resolution Date: 11/30/2016 Goal Status: Active Ulcer/skin breakdown will heal within 14 weeks Date Initiated: 09/21/2016 Target Resolution Date: 11/30/2016 Goal Status: Active Interventions: Assess patient/caregiver ability to obtain necessary supplies Assess patient/caregiver ability to perform ulcer/skin care regimen upon admission and as needed Assess ulceration(s) every visit Notes: Electronic Signature(s) Signed: 04/04/2017 5:53:42 PM By: Gretta Cool, BSN, RN, CWS, Kim RN, BSN Entered By: Gretta Cool, BSN, RN, CWS, Kim on 04/04/2017 11:31:54 Sayavong, [name] ([id number]) -------------------------------------------------------------------------------- Pain Assessment Details Patient Name: Susan, Hillel E. Date of Service: 04/04/2017 11:00 AM Medical Record Number: [id number] Patient Account Number: [id number] Date of Birth/Sex: [birth year] (66 y.o. Male) Treating RN: Cornell Barman Primary Care Mitul Hallowell: Myrtie Hawk Other Clinician: Referring Bryten Maher: Myrtie Hawk Treating Hardeep Reetz/Extender: Frann Rider in Treatment: 27 Active Problems Location of Pain Severity and  Description of Pain Patient Has Paino No Site Locations Pain Management and Medication Current Pain Management: Electronic Signature(s) Signed: 04/04/2017 5:53:42 PM By: Gretta Cool, BSN, RN, CWS, Kim RN, BSN Entered By: Gretta Cool, BSN, RN, CWS, Kim on 04/04/2017 11:09:46 Terrones, [name] ([id number]) -------------------------------------------------------------------------------- Patient/Caregiver Education Details Patient Name: [name]. Date of Service: 04/04/2017 11:00 AM Medical Record Number: [id number] Patient Account Number: [id number] Date of Birth/Gender: Jul 07, 1950 (66 y.o. Male) Treating RN: Cornell Barman Primary Care Physician: Myrtie Hawk Other Clinician: Referring Physician: Myrtie Hawk Treating Physician/Extender: Frann Rider in Treatment: 18 Education Assessment Education Provided To: Patient Education Topics Provided Wound/Skin Impairment: Handouts: Caring for Your Ulcer, Other: change dressings as ordered Methods: Demonstration, Explain/Verbal Responses: State content correctly Electronic Signature(s) Signed: 04/04/2017 5:53:42 PM By: Gretta Cool, BSN, RN, CWS, Kim RN, BSN Entered By: Gretta Cool, BSN, RN, CWS, Kim on 04/04/2017 11:45:50 Vogan, [name] ([id number]) -------------------------------------------------------------------------------- Wound Assessment Details Patient Name: Wolk, Tzvi E. Date of Service: 04/04/2017 11:00 AM Medical Record Number: [id number] Patient Account Number: [id number] Date of Birth/Sex: 1951-01-05 (66 y.o. Male) Treating RN: Cornell Barman Primary Care Chou Busler: Myrtie Hawk Other Clinician: Referring Ajiah Mcglinn: Myrtie Hawk Treating Kinsley Holderman/Extender: Frann Rider in Treatment: 27 Wound Status Wound Number: 1 Primary Diabetic Wound/Ulcer of the Lower Extremity Etiology: Wound Location: Right Toe Great Wound Open Wounding Event: Gradually Appeared Status: Date Acquired:  06/11/2016 Comorbid Anemia, Lymphedema, Congestive Heart Weeks Of Treatment: 27 History: Failure, Hypertension, Peripheral Venous Clustered Wound: No Disease, Type II Diabetes, Neuropathy Pending Amputation On Presentation Photos Photo Uploaded By: Alric Quan on 04/04/2017 16:24:47 Wound Measurements Length: (cm) 1.7 Width: (cm) 6.5 Depth: (cm) 0.1 Area: (cm) 8.679 Volume: (cm) 0.868 % Reduction in Area: 81.6% % Reduction in Volume: 81.6% Epithelialization: Small (1-33%) Tunneling: No Undermining: No Wound Description Classification: Grade 1 Wound Margin: Flat and Intact Exudate Amount: Large Exudate Type: Serosanguineous Exudate Color: red, brown Foul Odor After Cleansing: No Slough/Fibrino Yes Wound Bed Granulation Amount: Medium (34-66%) Exposed Structure Granulation Quality: Pink Fascia Exposed: No Necrotic Amount: Medium (34-66%) Fat Layer (Subcutaneous Tissue) Exposed: Yes Necrotic  Quality: Adherent Slough Tendon Exposed: No Muscle Exposed: No Joint Exposed: No Bone Exposed: No Periwound Skin Texture Serafin, Jery E. ([id number]) Texture Color No Abnormalities Noted: No No Abnormalities Noted: No Callus: No Atrophie Blanche: No Crepitus: No Cyanosis: No Excoriation: Yes Ecchymosis: No Induration: No Erythema: No Rash: No Hemosiderin Staining: No Scarring: No Mottled: No Pallor: No Moisture Rubor: No No Abnormalities Noted: No Dry / Scaly: No Temperature / Pain Maceration: Yes Temperature: No Abnormality Wound Preparation Ulcer Cleansing: Rinsed/Irrigated with Saline Topical Anesthetic Applied: None Treatment Notes Wound #1 (Right Toe Great) 1. Cleansed with: Clean wound with Normal Saline 2. Anesthetic Topical Lidocaine 4% cream to wound bed prior to debridement 3. Peri-wound Care: Antifungal cream 4. Dressing Applied: Other dressing (specify in notes) 5. Secondary Dressing Applied Dry Gauze Kerlix/Conform 7. Secured  with Tape Notes silvercel Electronic Signature(s) Signed: 04/04/2017 5:53:42 PM By: Gretta Cool, BSN, RN, CWS, Kim RN, BSN Entered By: Gretta Cool, BSN, RN, CWS, Kim on 04/04/2017 11:27:43 Clayborn, [name] ([id number]) -------------------------------------------------------------------------------- Wound Assessment Details Patient Name: Lindsley, Jericho E. Date of Service: 04/04/2017 11:00 AM Medical Record Number: [id number] Patient Account Number: [id number] Date of Birth/Sex: 05/17/1950 (66 y.o. Male) Treating RN: Cornell Barman Primary Care Kameshia Madruga: Myrtie Hawk Other Clinician: Referring Murlene Revell: Myrtie Hawk Treating Mandolin [name]/Extender: Frann Rider in Treatment: 27 Wound Status Wound Number: 2 Primary Diabetic Wound/Ulcer of the Lower Etiology: Extremity Wound Location: Right Toe Second Wound Status: Open Wounding Event: Gradually Appeared Date Acquired: 06/11/2016 Weeks Of Treatment: 27 Clustered Wound: No Pending Amputation On Presentation Photos Photo Uploaded By: Alric Quan on 04/04/2017 16:25:12 Wound Measurements Length: (cm) 2.1 Width: (cm) 2.5 Depth: (cm) 0.1 Area: (cm) 4.123 Volume: (cm) 0.412 % Reduction in Area: -34.3% % Reduction in Volume: -34.2% Wound Description Classification: Grade 1 Periwound Skin Texture Texture Color No Abnormalities Noted: No No Abnormalities Noted: No Moisture No Abnormalities Noted: No Treatment Notes Wound #2 (Right Toe Second) 1. Cleansed with: Clean wound with Normal Saline 2. Anesthetic Topical Lidocaine 4% cream to wound bed prior to debridement Bartnick, Daquon E. ([id number]) 3. Peri-wound Care: Antifungal cream 4. Dressing Applied: Other dressing (specify in notes) 5. Secondary Dressing Applied Dry Gauze Kerlix/Conform 7. Secured with Tape Notes silvercel Electronic Signature(s) Signed: 04/04/2017 5:53:42 PM By: Gretta Cool, BSN, RN, CWS, Kim RN, BSN Entered By: Gretta Cool, BSN, RN, CWS, Kim on  04/04/2017 11:29:51 Hurley, [name] ([id number]) -------------------------------------------------------------------------------- Wound Assessment Details Patient Name: Masterson, Geddy E. Date of Service: 04/04/2017 11:00 AM Medical Record Number: [id number] Patient Account Number: [id number] Date of Birth/Sex: 30-Sep-1950 (66 y.o. Male) Treating RN: Cornell Barman Primary Care Donn Zanetti: Myrtie Hawk Other Clinician: Referring Miabella Shannahan: Myrtie Hawk Treating Melisse Caetano/Extender: Frann Rider in Treatment: 27 Wound Status Wound Number: 5 Primary Diabetic Wound/Ulcer of the Lower Extremity Etiology: Wound Location: Left Toe Great Wound Open Wounding Event: Gradually Appeared Status: Date Acquired: 06/11/2016 Comorbid Anemia, Lymphedema, Congestive Heart Weeks Of Treatment: 27 History: Failure, Hypertension, Peripheral Venous Clustered Wound: No Disease, Type II Diabetes, Neuropathy Pending Amputation On Presentation Photos Photo Uploaded By: Alric Quan on 04/04/2017 16:25:31 Wound Measurements Length: (cm) 3 Width: (cm) 3.2 Depth: (cm) 0.1 Area: (cm) 7.54 Volume: (cm) 0.754 % Reduction in Area: 47.3% % Reduction in Volume: 47.2% Epithelialization: Small (1-33%) Tunneling: No Undermining: No Wound Description Classification: Grade 1 Wound Margin: Flat and Intact Exudate Amount: Large Exudate Type: Serous Exudate Color: amber Foul Odor After Cleansing: Yes Due to Product Use: No Slough/Fibrino Yes Wound Bed Granulation Amount: Medium (34-66%) Exposed Structure Granulation Quality:  Pink Fascia Exposed: No Necrotic Amount: Medium (34-66%) Fat Layer (Subcutaneous Tissue) Exposed: Yes Necrotic Quality: Adherent Slough Tendon Exposed: No Muscle Exposed: No Joint Exposed: No Bone Exposed: No Periwound Skin Texture Gawthrop, Gussie E. ([id number]) Texture Color No Abnormalities Noted: No No Abnormalities Noted: No Callus: No Atrophie Blanche:  No Crepitus: No Cyanosis: No Excoriation: Yes Ecchymosis: No Induration: No Erythema: No Rash: No Hemosiderin Staining: No Scarring: No Mottled: No Pallor: No Moisture Rubor: No No Abnormalities Noted: No Dry / Scaly: No Temperature / Pain Maceration: Yes Temperature: No Abnormality Wound Preparation Ulcer Cleansing: Rinsed/Irrigated with Saline Treatment Notes Wound #5 (Left Toe Great) 1. Cleansed with: Clean wound with Normal Saline 2. Anesthetic Topical Lidocaine 4% cream to wound bed prior to debridement 3. Peri-wound Care: Antifungal cream 4. Dressing Applied: Other dressing (specify in notes) 5. Secondary Dressing Applied Dry Gauze Kerlix/Conform 7. Secured with Tape Notes silvercel Electronic Signature(s) Signed: 04/04/2017 5:53:42 PM By: Gretta Cool, BSN, RN, CWS, Kim RN, BSN Entered By: Gretta Cool, BSN, RN, CWS, Kim on 04/04/2017 11:30:42 Jeanbaptiste, [name] ([id number]) -------------------------------------------------------------------------------- Wound Assessment Details Patient Name: Connell, Martese E. Date of Service: 04/04/2017 11:00 AM Medical Record Number: [id number] Patient Account Number: [id number] Date of Birth/Sex: March 10, 1951 (66 y.o. Male) Treating RN: Cornell Barman Primary Care Kaylani Fromme: Myrtie Hawk Other Clinician: Referring Brianni Manthe: Myrtie Hawk Treating Spenser Harren/Extender: Frann Rider in Treatment: 27 Wound Status Wound Number: 6 Primary Diabetic Wound/Ulcer of the Lower Extremity Etiology: Wound Location: Left Toe Second Wound Open Wounding Event: Gradually Appeared Status: Date Acquired: 06/11/2016 Comorbid Anemia, Lymphedema, Congestive Heart Weeks Of Treatment: 27 History: Failure, Hypertension, Peripheral Venous Clustered Wound: No Disease, Type II Diabetes, Neuropathy Pending Amputation On Presentation Photos Photo Uploaded By: Alric Quan on 04/04/2017 16:25:47 Wound Measurements Length: (cm) 0.2 Width:  (cm) 0.6 Depth: (cm) 0.1 Area: (cm) 0.094 Volume: (cm) 0.009 % Reduction in Area: 95.6% % Reduction in Volume: 95.8% Epithelialization: Small (1-33%) Tunneling: No Undermining: No Wound Description Classification: Grade 1 Wound Margin: Flat and Intact Exudate Amount: Large Exudate Type: Serous Exudate Color: amber Foul Odor After Cleansing: No Slough/Fibrino Yes Wound Bed Granulation Amount: Medium (34-66%) Exposed Structure Granulation Quality: Pink Fascia Exposed: No Necrotic Amount: Medium (34-66%) Fat Layer (Subcutaneous Tissue) Exposed: Yes Necrotic Quality: Adherent Slough Tendon Exposed: No Muscle Exposed: No Joint Exposed: No Bone Exposed: No Periwound Skin Texture Spiering, Akshay E. ([id number]) Texture Color No Abnormalities Noted: No No Abnormalities Noted: No Callus: No Atrophie Blanche: No Crepitus: No Cyanosis: No Excoriation: Yes Ecchymosis: No Induration: No Erythema: No Rash: No Hemosiderin Staining: No Scarring: No Mottled: No Pallor: No Moisture Rubor: No No Abnormalities Noted: No Dry / Scaly: No Temperature / Pain Maceration: Yes Temperature: No Abnormality Wound Preparation Ulcer Cleansing: Rinsed/Irrigated with Saline Treatment Notes Wound #6 (Left Toe Second) 1. Cleansed with: Clean wound with Normal Saline 2. Anesthetic Topical Lidocaine 4% cream to wound bed prior to debridement 3. Peri-wound Care: Antifungal cream 4. Dressing Applied: Other dressing (specify in notes) 5. Secondary Dressing Applied Dry Gauze Kerlix/Conform 7. Secured with Tape Notes silvercel Electronic Signature(s) Signed: 04/04/2017 5:53:42 PM By: Gretta Cool, BSN, RN, CWS, Kim RN, BSN Entered By: Gretta Cool, BSN, RN, CWS, Kim on 04/04/2017 11:30:57 Herman, [name] ([id number]) -------------------------------------------------------------------------------- Wound Assessment Details Patient Name: Riss, Raffi E. Date of Service: 04/04/2017 11:00 AM Medical  Record Number: [id number] Patient Account Number: [id number] Date of Birth/Sex: 1950/05/17 (66 y.o. Male) Treating RN: Cornell Barman Primary Care Julane Crock: Myrtie Hawk Other Clinician: Referring Kadden Osterhout: Myrtie Hawk Treating Varsha Knock/Extender:  Britto, Errol Weeks in Treatment: 27 Wound Status Wound Number: 8 Primary Diabetic Wound/Ulcer of the Lower Extremity Etiology: Wound Location: Left Toe Third Wound Open Wounding Event: Gradually Appeared Status: Date Acquired: 12/30/2016 Comorbid Anemia, Lymphedema, Congestive Heart Weeks Of Treatment: 13 History: Failure, Hypertension, Peripheral Venous Clustered Wound: No Disease, Type II Diabetes, Neuropathy Photos Photo Uploaded By: Alric Quan on 04/04/2017 16:26:03 Wound Measurements Length: (cm) 0.4 Width: (cm) 0.7 Depth: (cm) 0.1 Area: (cm) 0.22 Volume: (cm) 0.022 % Reduction in Area: 72.5% % Reduction in Volume: 72.5% Epithelialization: None Tunneling: No Undermining: No Wound Description Classification: Grade 1 Wound Margin: Distinct, outline attached Exudate Amount: Large Exudate Type: Serosanguineous Exudate Color: red, brown Foul Odor After Cleansing: No Slough/Fibrino Yes Wound Bed Granulation Amount: Medium (34-66%) Granulation Quality: Red Necrotic Amount: Medium (34-66%) Necrotic Quality: Adherent Slough Periwound Skin Texture Texture Color No Abnormalities Noted: No No Abnormalities Noted: No Moisture Temperature / Pain Maniaci, Charlee E. ([id number]) No Abnormalities Noted: No Temperature: No Abnormality Maceration: Yes Tenderness on Palpation: Yes Wound Preparation Ulcer Cleansing: Rinsed/Irrigated with Saline Treatment Notes Wound #8 (Left Toe Third) 1. Cleansed with: Clean wound with Normal Saline 2. Anesthetic Topical Lidocaine 4% cream to wound bed prior to debridement 3. Peri-wound Care: Antifungal cream 4. Dressing Applied: Other dressing (specify in notes) 5.  Secondary Dressing Applied Dry Gauze Kerlix/Conform 7. Secured with Tape Notes silvercel Electronic Signature(s) Signed: 04/04/2017 5:53:42 PM By: Gretta Cool, BSN, RN, CWS, Kim RN, BSN Entered By: Gretta Cool, BSN, RN, CWS, Kim on 04/04/2017 11:31:10 Bohle, [name] ([id number]) -------------------------------------------------------------------------------- Wound Assessment Details Patient Name: Mcwhirt, Jovanne E. Date of Service: 04/04/2017 11:00 AM Medical Record Number: [id number] Patient Account Number: [id number] Date of Birth/Sex: 02/10/1951 (66 y.o. Male) Treating RN: Cornell Barman Primary Care Denijah Karrer: Myrtie Hawk Other Clinician: Referring Tu Bayle: Myrtie Hawk Treating Nayeliz Hipp/Extender: Frann Rider in Treatment: 27 Wound Status Wound Number: 9 Primary Diabetic Wound/Ulcer of the Lower Extremity Etiology: Wound Location: Right Toe Third Wound Open Wounding Event: Gradually Appeared Status: Date Acquired: 12/31/2016 Comorbid Anemia, Lymphedema, Congestive Heart Weeks Of Treatment: 13 History: Failure, Hypertension, Peripheral Venous Clustered Wound: No Disease, Type II Diabetes, Neuropathy Photos Photo Uploaded By: Alric Quan on 04/04/2017 16:26:22 Wound Measurements Length: (cm) 1 Width: (cm) 0.7 Depth: (cm) 0.1 Area: (cm) 0.55 Volume: (cm) 0.055 % Reduction in Area: -39.9% % Reduction in Volume: -41% Epithelialization: Medium (34-66%) Tunneling: No Undermining: No Wound Description Classification: Grade 1 Wound Margin: Flat and Intact Exudate Amount: Large Exudate Type: Serous Exudate Color: amber Foul Odor After Cleansing: Yes Due to Product Use: No Slough/Fibrino Yes Wound Bed Granulation Amount: Medium (34-66%) Exposed Structure Granulation Quality: Pink Fascia Exposed: No Necrotic Amount: Medium (34-66%) Fat Layer (Subcutaneous Tissue) Exposed: No Necrotic Quality: Adherent Slough Tendon Exposed: No Muscle Exposed:  No Joint Exposed: No Bone Exposed: No Periwound Skin Texture Raver, Jones E. ([id number]) Texture Color No Abnormalities Noted: No No Abnormalities Noted: No Callus: No Atrophie Blanche: No Crepitus: No Cyanosis: No Excoriation: No Ecchymosis: No Induration: No Erythema: No Rash: No Hemosiderin Staining: No Scarring: No Mottled: No Pallor: No Moisture Rubor: No No Abnormalities Noted: No Dry / Scaly: No Temperature / Pain Maceration: Yes Temperature: No Abnormality Wound Preparation Ulcer Cleansing: Rinsed/Irrigated with Saline Treatment Notes Wound #9 (Right Toe Third) 1. Cleansed with: Clean wound with Normal Saline 2. Anesthetic Topical Lidocaine 4% cream to wound bed prior to debridement 3. Peri-wound Care: Antifungal cream 4. Dressing Applied: Other dressing (specify in notes) 5. Secondary Dressing Applied Dry Gauze  Kerlix/Conform 7. Secured with Tape Notes silvercel Electronic Signature(s) Signed: 04/04/2017 5:53:42 PM By: Gretta Cool, BSN, RN, CWS, Kim RN, BSN Entered By: Gretta Cool, BSN, RN, CWS, Kim on 04/04/2017 11:31:23 Shorty, [name] ([id number]) -------------------------------------------------------------------------------- Vitals Details Patient Name: Bendavid, Leona E. Date of Service: 04/04/2017 11:00 AM Medical Record Number: [id number] Patient Account Number: [id number] Date of Birth/Sex: 02/18/1951 (66 y.o. Male) Treating RN: Cornell Barman Primary Care Gazelle Towe: Myrtie Hawk Other Clinician: Referring Majorie Santee: Myrtie Hawk Treating Milisa Kimbell/Extender: Frann Rider in Treatment: 27 Vital Signs Time Taken: 11:10 Temperature (F): 98.0 Height (in): 69 Pulse (bpm): 69 Weight (lbs): 168 Respiratory Rate (breaths/min): 16 Body Mass Index (BMI): 24.8 Blood Pressure (mmHg): 150/83 Reference Range: 80 - 120 mg / dl Electronic Signature(s) Signed: 04/04/2017 5:53:42 PM By: Gretta Cool, BSN, RN, CWS, Kim RN, BSN Entered By: Gretta Cool,  BSN, RN, CWS, Kim on 04/04/2017 11:10:26

## 2017-04-16 ENCOUNTER — Inpatient Hospital Stay: Payer: Medicare HMO | Attending: Oncology

## 2017-04-16 ENCOUNTER — Other Ambulatory Visit: Payer: Self-pay | Admitting: Oncology

## 2017-04-16 ENCOUNTER — Inpatient Hospital Stay: Payer: Medicare HMO

## 2017-04-16 VITALS — BP 163/80 | HR 66

## 2017-04-16 DIAGNOSIS — R911 Solitary pulmonary nodule: Secondary | ICD-10-CM | POA: Diagnosis not present

## 2017-04-16 DIAGNOSIS — M5126 Other intervertebral disc displacement, lumbar region: Secondary | ICD-10-CM | POA: Insufficient documentation

## 2017-04-16 DIAGNOSIS — I130 Hypertensive heart and chronic kidney disease with heart failure and stage 1 through stage 4 chronic kidney disease, or unspecified chronic kidney disease: Secondary | ICD-10-CM | POA: Insufficient documentation

## 2017-04-16 DIAGNOSIS — F17210 Nicotine dependence, cigarettes, uncomplicated: Secondary | ICD-10-CM | POA: Diagnosis not present

## 2017-04-16 DIAGNOSIS — M5137 Other intervertebral disc degeneration, lumbosacral region: Secondary | ICD-10-CM | POA: Insufficient documentation

## 2017-04-16 DIAGNOSIS — I509 Heart failure, unspecified: Secondary | ICD-10-CM | POA: Diagnosis not present

## 2017-04-16 DIAGNOSIS — D631 Anemia in chronic kidney disease: Secondary | ICD-10-CM | POA: Diagnosis not present

## 2017-04-16 DIAGNOSIS — R188 Other ascites: Secondary | ICD-10-CM | POA: Insufficient documentation

## 2017-04-16 DIAGNOSIS — E1122 Type 2 diabetes mellitus with diabetic chronic kidney disease: Secondary | ICD-10-CM | POA: Diagnosis not present

## 2017-04-16 DIAGNOSIS — R591 Generalized enlarged lymph nodes: Secondary | ICD-10-CM | POA: Insufficient documentation

## 2017-04-16 DIAGNOSIS — D509 Iron deficiency anemia, unspecified: Secondary | ICD-10-CM

## 2017-04-16 DIAGNOSIS — D638 Anemia in other chronic diseases classified elsewhere: Secondary | ICD-10-CM

## 2017-04-16 DIAGNOSIS — Z862 Personal history of diseases of the blood and blood-forming organs and certain disorders involving the immune mechanism: Secondary | ICD-10-CM

## 2017-04-16 DIAGNOSIS — N189 Chronic kidney disease, unspecified: Secondary | ICD-10-CM | POA: Diagnosis not present

## 2017-04-16 DIAGNOSIS — K219 Gastro-esophageal reflux disease without esophagitis: Secondary | ICD-10-CM | POA: Insufficient documentation

## 2017-04-16 DIAGNOSIS — D649 Anemia, unspecified: Secondary | ICD-10-CM

## 2017-04-16 LAB — HEMOGLOBIN: Hemoglobin: 7.4 g/dL — ABNORMAL LOW (ref 13.0–18.0)

## 2017-04-16 MED ORDER — EPOETIN ALFA 40000 UNIT/ML IJ SOLN
40000.0000 [IU] | INTRAMUSCULAR | Status: DC
  Administered 2017-04-16: 40000 [IU] via SUBCUTANEOUS
  Filled 2017-04-16: qty 1

## 2017-04-16 NOTE — Progress Notes (Signed)
MD increased dose of Procrit to 40,000 and ok to treat with BP currently.

## 2017-04-16 NOTE — Progress Notes (Signed)
Notified MD of Hemoglobin result 7.4 today and BP 163/80.  Dr. Janese Banks is increasing Procrit to 40,000 and OK with to proceed with BP of 163/80.

## 2017-04-18 ENCOUNTER — Encounter: Payer: Medicare HMO | Attending: Physician Assistant | Admitting: Physician Assistant

## 2017-04-18 DIAGNOSIS — I87313 Chronic venous hypertension (idiopathic) with ulcer of bilateral lower extremity: Secondary | ICD-10-CM | POA: Diagnosis not present

## 2017-04-18 DIAGNOSIS — F1019 Alcohol abuse with unspecified alcohol-induced disorder: Secondary | ICD-10-CM | POA: Diagnosis not present

## 2017-04-18 DIAGNOSIS — I509 Heart failure, unspecified: Secondary | ICD-10-CM | POA: Diagnosis not present

## 2017-04-18 DIAGNOSIS — E11621 Type 2 diabetes mellitus with foot ulcer: Secondary | ICD-10-CM | POA: Diagnosis not present

## 2017-04-18 DIAGNOSIS — I890 Lymphedema, not elsewhere classified: Secondary | ICD-10-CM | POA: Diagnosis not present

## 2017-04-18 DIAGNOSIS — M86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, right ankle and foot: Secondary | ICD-10-CM | POA: Diagnosis not present

## 2017-04-18 DIAGNOSIS — E1140 Type 2 diabetes mellitus with diabetic neuropathy, unspecified: Secondary | ICD-10-CM | POA: Insufficient documentation

## 2017-04-18 DIAGNOSIS — Z7984 Long term (current) use of oral hypoglycemic drugs: Secondary | ICD-10-CM | POA: Insufficient documentation

## 2017-04-18 DIAGNOSIS — F17218 Nicotine dependence, cigarettes, with other nicotine-induced disorders: Secondary | ICD-10-CM | POA: Insufficient documentation

## 2017-04-18 DIAGNOSIS — L97522 Non-pressure chronic ulcer of other part of left foot with fat layer exposed: Secondary | ICD-10-CM | POA: Diagnosis not present

## 2017-04-18 DIAGNOSIS — D509 Iron deficiency anemia, unspecified: Secondary | ICD-10-CM | POA: Diagnosis not present

## 2017-04-18 DIAGNOSIS — L97512 Non-pressure chronic ulcer of other part of right foot with fat layer exposed: Secondary | ICD-10-CM | POA: Diagnosis not present

## 2017-04-18 DIAGNOSIS — I110 Hypertensive heart disease with heart failure: Secondary | ICD-10-CM | POA: Insufficient documentation

## 2017-04-20 NOTE — Progress Notes (Signed)
[name], [name] ([id number]) Visit Report for 04/18/2017 Arrival Information Details Patient Name: [name] Date of Service: 04/18/2017 9:15 AM Medical Record Number: [id number] Patient Account Number: [id number] Date of Birth/Sex: Mar 22, 1951 (67 y.o. Male) Treating RN: Cornell Barman Primary Care Aalayah Riles: Myrtie Hawk Other Clinician: Referring Isaiahs Chancy: Myrtie Hawk Treating Doniel Maiello/Extender: Melburn Hake, HOYT Weeks in Treatment: 79 Visit Information History Since Last Visit Added or deleted any medications: No Patient Arrived: Ambulatory Any new allergies or adverse reactions: No Arrival Time: 09:37 Had a fall or experienced change in No Accompanied By: friend activities of daily living that may affect Transfer Assistance: None risk of falls: Patient Identification Verified: Yes Signs or symptoms of abuse/neglect since last visito No Secondary Verification Process Completed: Yes Hospitalized since last visit: No Patient Requires Transmission-Based No Has Dressing in Place as Prescribed: Yes Precautions: Pain Present Now: No Patient Has Alerts: Yes Patient Alerts: DMII Electronic Signature(s) Signed: 04/18/2017 5:27:42 PM By: Gretta Cool, BSN, RN, CWS, Kim RN, BSN Entered By: Gretta Cool, BSN, RN, CWS, Kim on 04/18/2017 09:38:19 Rolf, [name] ([id number]) -------------------------------------------------------------------------------- Clinic Level of Care Assessment Details Patient Name: [name], [name] Date of Service: 04/18/2017 9:15 AM Medical Record Number: [id number] Patient Account Number: [id number] Date of Birth/Sex: 03-Nov-1950 (67 y.o. Male) Treating RN: Cornell Barman Primary Care Kleber Crean: Myrtie Hawk Other Clinician: Referring Naim Murtha: Myrtie Hawk Treating Grafton Warzecha/Extender: Melburn Hake, HOYT Weeks in Treatment: 29 Clinic Level of Care Assessment Items TOOL 4 Quantity Score []  - Use when only an EandM is performed on FOLLOW-UP visit  0 ASSESSMENTS - Nursing Assessment / Reassessment []  - Reassessment of Co-morbidities (includes updates in patient status) 0 X- 1 5 Reassessment of Adherence to Treatment Plan ASSESSMENTS - Wound and Skin Assessment / Reassessment []  - Simple Wound Assessment / Reassessment - one wound 0 X- 1 5 Complex Wound Assessment / Reassessment - multiple wounds []  - 0 Dermatologic / Skin Assessment (not related to wound area) ASSESSMENTS - Focused Assessment []  - Circumferential Edema Measurements - multi extremities 0 []  - 0 Nutritional Assessment / Counseling / Intervention []  - 0 Lower Extremity Assessment (monofilament, tuning fork, pulses) []  - 0 Peripheral Arterial Disease Assessment (using hand held doppler) ASSESSMENTS - Ostomy and/or Continence Assessment and Care []  - Incontinence Assessment and Management 0 []  - 0 Ostomy Care Assessment and Management (repouching, etc.) PROCESS - Coordination of Care X - Simple Patient / Family Education for ongoing care 1 15 []  - 0 Complex (extensive) Patient / Family Education for ongoing care X- 1 10 Staff obtains Programmer, systems, Records, Test Results / Process Orders []  - 0 Staff telephones HHA, Nursing Homes / Clarify orders / etc []  - 0 Routine Transfer to another Facility (non-emergent condition) []  - 0 Routine Hospital Admission (non-emergent condition) []  - 0 New Admissions / Biomedical engineer / Ordering NPWT, Apligraf, etc. []  - 0 Emergency Hospital Admission (emergent condition) X- 1 10 Simple Discharge Coordination Pore, Lot E. ([id number]) []  - 0 Complex (extensive) Discharge Coordination PROCESS - Special Needs []  - Pediatric / Minor Patient Management 0 []  - 0 Isolation Patient Management []  - 0 Hearing / Language / Visual special needs []  - 0 Assessment of Community assistance (transportation, D/C planning, etc.) []  - 0 Additional assistance / Altered mentation []  - 0 Support Surface(s) Assessment (bed,  cushion, seat, etc.) INTERVENTIONS - Wound Cleansing / Measurement []  - Simple Wound Cleansing - one wound 0 X- 5 5 Complex Wound Cleansing - multiple wounds []  - 0 Wound Imaging (photographs -  any number of wounds) X- 1 5 Wound Tracing (instead of photographs) []  - 0 Simple Wound Measurement - one wound X- 5 5 Complex Wound Measurement - multiple wounds INTERVENTIONS - Wound Dressings []  - Small Wound Dressing one or multiple wounds 0 X- 2 15 Medium Wound Dressing one or multiple wounds []  - 0 Large Wound Dressing one or multiple wounds X- 1 5 Application of Medications - topical []  - 0 Application of Medications - injection INTERVENTIONS - Miscellaneous []  - External ear exam 0 []  - 0 Specimen Collection (cultures, biopsies, blood, body fluids, etc.) []  - 0 Specimen(s) / Culture(s) sent or taken to Lab for analysis []  - 0 Patient Transfer (multiple staff / Civil Service fast streamer / Similar devices) []  - 0 Simple Staple / Suture removal (25 or less) []  - 0 Complex Staple / Suture removal (26 or more) []  - 0 Hypo / Hyperglycemic Management (close monitor of Blood Glucose) []  - 0 Ankle / Brachial Index (ABI) - do not check if billed separately X- 1 5 Vital Signs Raboin, Destyn E. ([id number]) Has the patient been seen at the hospital within the last three years: Yes Total Score: 140 Level Of Care: New/Established - Level 4 Electronic Signature(s) Signed: 04/18/2017 5:27:42 PM By: Gretta Cool, BSN, RN, CWS, Kim RN, BSN Entered By: Gretta Cool, BSN, RN, CWS, Kim on 04/18/2017 11:04:28 Mellinger, [name] ([id number]) -------------------------------------------------------------------------------- Encounter Discharge Information Details Patient Name: [name], [name] Date of Service: 04/18/2017 9:15 AM Medical Record Number: [id number] Patient Account Number: [id number] Date of Birth/Sex: January 03, 1951 (67 y.o. Male) Treating RN: Cornell Barman Primary Care Cassandra Harbold: Myrtie Hawk Other  Clinician: Referring Jalilah Wiltsie: Myrtie Hawk Treating Malyah Ohlrich/Extender: Melburn Hake, HOYT Weeks in Treatment: 76 Encounter Discharge Information Items Discharge Pain Level: 0 Discharge Condition: Stable Ambulatory Status: Ambulatory Discharge Destination: Home Transportation: Private Auto Accompanied By: friend Schedule Follow-up Appointment: Yes Medication Reconciliation completed and Yes provided to Patient/Care Alois Mincer: Patient Clinical Summary of Care: Declined Electronic Signature(s) Signed: 04/18/2017 11:04:53 AM By: Gretta Cool, BSN, RN, CWS, Kim RN, BSN Entered By: Gretta Cool, BSN, RN, CWS, Kim on 04/18/2017 11:04:52 Weisner, [name] ([id number]) -------------------------------------------------------------------------------- Lower Extremity Assessment Details Patient Name: Bordas, Fredie E. Date of Service: 04/18/2017 9:15 AM Medical Record Number: [id number] Patient Account Number: [id number] Date of Birth/Sex: 05/15/1950 (67 y.o. Male) Treating RN: Cornell Barman Primary Care Khadar Monger: Myrtie Hawk Other Clinician: Referring Shakeerah Gradel: Myrtie Hawk Treating Shamere Campas/Extender: Melburn Hake, HOYT Weeks in Treatment: 29 Vascular Assessment Pulses: Dorsalis Pedis Palpable: [Left:Yes] [Right:Yes] Posterior Tibial Extremity colors, hair growth, and conditions: Extremity Color: [Left:Hyperpigmented] [Right:Hyperpigmented] Hair Growth on Extremity: [Left:No] [Right:No] Temperature of Extremity: [Left:Warm] [Right:Warm] Capillary Refill: [Left:< 3 seconds] [Right:< 3 seconds] Toe Nail Assessment Left: Right: Thick: No No Discolored: No No Deformed: Yes Yes Improper Length and Hygiene: Yes Yes Electronic Signature(s) Signed: 04/18/2017 5:27:42 PM By: Gretta Cool, BSN, RN, CWS, Kim RN, BSN Entered By: Gretta Cool, BSN, RN, CWS, Kim on 04/18/2017 09:48:52 Resnik, [name] ([id number]) -------------------------------------------------------------------------------- Multi Wound  Chart Details Patient Name: Noyes, Jassen E. Date of Service: 04/18/2017 9:15 AM Medical Record Number: [id number] Patient Account Number: [id number] Date of Birth/Sex: 17-Oct-1950 (67 y.o. 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 Lower Extremity Date Acquired: 06/11/2016 06/11/2016 06/11/2016 Weeks of Treatment: 29 29 29  Wound Status: Open Open Open Pending Amputation on Yes Yes Yes Presentation: Measurements L x W x D 1x1.4x0.1 2x2.5x0.1 1x1.5x0.1 (cm) Area (cm) : 1.1 3.927 1.178 Volume (cm) : 0.11 0.393 0.118 % Reduction in Area: 97.70% -27.90% 91.80% % Reduction in Volume: 97.70% -28.00% 91.70% Classification: Grade 1 Grade 1 Grade 1 Periwound Skin Texture: No Abnormalities Noted No Abnormalities Noted No Abnormalities Noted Periwound Skin Moisture: No Abnormalities Noted No Abnormalities Noted No Abnormalities Noted Periwound Skin Color: No Abnormalities Noted No Abnormalities Noted No Abnormalities Noted Tenderness on Palpation: No No No Wound Number: 6 [id number] 9  Photos: Eskin, Perley EMarland Kitchen ([id number]) Wound Location: Left Toe Second Left Toe Third Right Toe Third Wounding Event: Gradually Appeared Gradually Appeared Gradually Appeared Primary Etiology: Diabetic Wound/Ulcer of 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 Lower Extremity Lower  Extremity Date Acquired: 06/11/2016 12/30/2016 12/31/2016 Weeks of Treatment: 29 15 15  Wound Status: Open Healed - Epithelialized Open Pending Amputation on Yes No No Presentation: Measurements L x W x D 1x1.2x0.1 0x0x0 1x0.8x0.1 (cm) Area (cm) : 0.942 0 0.628 Volume (cm) : 0.094 0 0.063 % Reduction in Area: 55.90% 100.00% -59.80% % Reduction in Volume: 56.10% 100.00% -61.50% Classification: Grade 1 Grade 1 Grade 1 Periwound Skin Texture: No Abnormalities Noted No Abnormalities Noted No Abnormalities Noted Periwound Skin Moisture: No Abnormalities Noted No Abnormalities Noted No Abnormalities Noted Periwound Skin Color: No Abnormalities Noted No Abnormalities Noted No Abnormalities Noted Tenderness on Palpation: No No No Treatment Notes Wound #1 (Right Toe Great) 1. Cleansed with: Clean wound with Normal Saline 2. Anesthetic Topical Lidocaine 4% cream to wound bed prior to debridement 3. Peri-wound Care: Antifungal cream 4. Dressing Applied: Other dressing (specify in notes) 5. Secondary Dressing Applied ABD Pad Kerlix/Conform 7. Secured with Tape Notes silvercel Wound #2 (Right Toe Second) 1. Cleansed with: Clean wound with Normal Saline 2. Anesthetic Topical Lidocaine 4% cream to wound bed prior to debridement 3. Peri-wound Care: Antifungal cream 4. Dressing Applied: Other dressing (specify in notes) 5. Secondary Dressing Applied ABD Pad Kerlix/Conform Raymundo, Acel E. ([id number]) 7. Secured with Tape Notes silvercel Wound #5 (Left Toe Great) 1. Cleansed with: Clean wound with Normal Saline 2. Anesthetic Topical Lidocaine 4% cream to wound bed prior to debridement 3. Peri-wound Care: Antifungal cream 4. Dressing Applied: Other dressing (specify in notes) 5. Secondary Dressing Applied ABD Pad Kerlix/Conform 7. Secured with Tape Notes silvercel Wound #6 (Left Toe Second) 1. Cleansed with: Clean wound with Normal Saline 2. Anesthetic Topical  Lidocaine 4% cream to wound bed prior to debridement 3. Peri-wound Care: Antifungal cream 4. Dressing Applied: Other dressing (specify in notes) 5. Secondary Dressing Applied ABD Pad Kerlix/Conform 7. Secured with Tape Notes silvercel Wound #9 (Right Toe Third) 1. Cleansed with: Clean wound with Normal Saline 2. Anesthetic Topical Lidocaine 4% cream to wound bed prior to debridement 3. Peri-wound Care: Antifungal cream 4. Dressing Applied: Other dressing (specify in notes) 5. Secondary Dressing Applied ABD Pad Kerlix/Conform 7. Secured with PRABHJOT, PISCITELLO ([id number]) Tape Notes silvercel Electronic Signature(s) Signed: 04/18/2017 11:02:10 AM By: Gretta Cool, BSN, RN, CWS, Kim RN, BSN Entered By: Gretta Cool, BSN, RN, CWS, Kim on 04/18/2017 11:02:10 Bouler, [name] ([id number]) -------------------------------------------------------------------------------- [place] Details Patient Name: Cabeza, Govanni E. Date of Service: 04/18/2017 9:15 AM Medical  Record Number: [id number] Patient Account Number: [id number] Date of Birth/Sex: 05-19-1950 (67 y.o. Male) Treating RN: Cornell Barman Primary Care Janeva Peaster: Myrtie Hawk Other Clinician: Referring Renika Shiflet: Myrtie Hawk Treating Lysle Yero/Extender: Melburn Hake, HOYT Weeks in Treatment: 29 Active Inactive ` Abuse / Safety / Falls / Self Care Management Nursing Diagnoses: Potential for falls Goals: Patient will remain injury free related to falls Date Initiated: 09/21/2016 Target Resolution Date: 11/30/2016 Goal Status: Active Interventions: Assess fall risk on admission and as needed Notes: ` Nutrition Nursing Diagnoses: Potential for alteratiion in Nutrition/Potential for imbalanced nutrition Goals: Patient/caregiver agrees to and verbalizes understanding of need to use nutritional supplements and/or vitamins as prescribed Date Initiated: 09/21/2016 Target Resolution Date: 11/30/2016 Goal Status:  Active Interventions: Assess patient nutrition upon admission and as needed per policy Notes: ` Orientation to the Wound Care Program Nursing Diagnoses: Knowledge deficit related to the wound healing center program Goals: Patient/caregiver will verbalize understanding of the [place] Program Date Initiated: 09/21/2016 Target Resolution Date: 11/30/2016 Goal Status: Active Interventions: DARKBrentt, Fread ([id number]) Provide education on orientation to the wound center Notes: ` Wound/Skin Impairment Nursing Diagnoses: Knowledge deficit related to smoking impact on wound healing Goals: Ulcer/skin breakdown will have a volume reduction of 30% by week 4 Date Initiated: 09/21/2016 Target Resolution Date: 11/30/2016 Goal Status: Active Ulcer/skin breakdown will have a volume reduction of 50% by week 8 Date Initiated: 09/21/2016 Target Resolution Date: 11/30/2016 Goal Status: Active Ulcer/skin breakdown will have a volume reduction of 80% by week 12 Date Initiated: 09/21/2016 Target Resolution Date: 11/30/2016 Goal Status: Active Ulcer/skin breakdown will heal within 14 weeks Date Initiated: 09/21/2016 Target Resolution Date: 11/30/2016 Goal Status: Active Interventions: Assess patient/caregiver ability to obtain necessary supplies Assess patient/caregiver ability to perform ulcer/skin care regimen upon admission and as needed Assess ulceration(s) every visit Notes: Electronic Signature(s) Signed: 04/18/2017 5:27:42 PM By: Gretta Cool, BSN, RN, CWS, Kim RN, BSN Entered By: Gretta Cool, BSN, RN, CWS, Kim on 04/18/2017 09:58:14 [place]uba, [name] ([id number]) -------------------------------------------------------------------------------- Pain Assessment Details Patient Name: Wolk, Ishaan E. Date of Service: 04/18/2017 9:15 AM Medical Record Number: [id number] Patient Account Number: [id number] Date of Birth/Sex: 1950-04-14 (67 y.o. Male) Treating RN: Cornell Barman Primary Care Jhalil Silvera:  Myrtie Hawk Other Clinician: Referring Melody Savidge: Myrtie Hawk Treating Malavika Lira/Extender: Melburn Hake, HOYT Weeks in Treatment: 29 Active Problems Location of Pain Severity and Description of Pain Patient Has Paino No Site Locations With Dressing Change: No Pain Management and Medication Current Pain Management: Goals for Pain Management Topical or injectable lidocaine is offered to patient for acute pain when surgical debridement is performed. If needed, Patient is instructed to use over the counter pain medication for the following 24-48 hours after debridement. Wound care MDs do not prescribed pain medications. Patient has chronic pain or uncontrolled pain. Patient has been instructed to make an appointment with their Primary Care Physician for pain management. Electronic Signature(s) Signed: 04/18/2017 5:27:42 PM By: Gretta Cool, BSN, RN, CWS, Kim RN, BSN Entered By: Gretta Cool, BSN, RN, CWS, Kim on 04/18/2017 09:38:34 Hagemeister, [name] ([id number]) -------------------------------------------------------------------------------- Patient/Caregiver Education Details Patient Name: SAYYID, HAREWOOD E. Date of Service: 04/18/2017 9:15 AM Medical Record Number: [id number] Patient Account Number: [id number] Date of Birth/Gender: 1951-01-26 (67 y.o. Male) Treating RN: Cornell Barman Primary Care Physician: Myrtie Hawk Other Clinician: Referring Physician: Myrtie Hawk Treating Physician/Extender: Sharalyn Ink in Treatment: 16 Education Assessment Education Provided To: Patient Education Topics Provided Wound/Skin Impairment: Handouts: Caring for Your Ulcer, Other: continue wound care as prescribed Methods: Demonstration, Explain/Verbal  Responses: State content correctly Electronic Signature(s) Signed: 04/18/2017 5:27:42 PM By: Gretta Cool, BSN, RN, CWS, Kim RN, BSN Entered By: Gretta Cool, BSN, RN, CWS, Kim on 04/18/2017 11:05:19 Jerez, [name]  ([id number]) -------------------------------------------------------------------------------- Wound Assessment Details Patient Name: Chagnon, Roni E. Date of Service: 04/18/2017 9:15 AM Medical Record Number: [id number] Patient Account Number: [id number] Date of Birth/Sex: 24-Apr-1950 (67 y.o. Male) Treating RN: Cornell Barman Primary Care Paighton Godette: Myrtie Hawk Other Clinician: Referring Keiarra Charon: Myrtie Hawk Treating Anberlyn Feimster/Extender: Melburn Hake, HOYT Weeks in Treatment: 29 Wound Status Wound Number: 1 Primary Diabetic Wound/Ulcer of the Lower Etiology: Extremity Wound Location: Right Toe Great Wound Status: Open Wounding Event: Gradually Appeared Date Acquired: 06/11/2016 Weeks Of Treatment: 29 Clustered Wound: No Pending Amputation On Presentation Photos Photo Uploaded By: Gretta Cool, BSN, RN, CWS, Kim on 04/18/2017 09:59:34 Wound Measurements Length: (cm) 1 Width: (cm) 1.4 Depth: (cm) 0.1 Area: (cm) 1.1 Volume: (cm) 0.11 % Reduction in Area: 97.7% % Reduction in Volume: 97.7% Wound Description Classification: Grade 1 Periwound Skin Texture Texture Color No Abnormalities Noted: No No Abnormalities Noted: No Moisture No Abnormalities Noted: No Treatment Notes Wound #1 (Right Toe Great) 1. Cleansed with: Clean wound with Normal Saline 2. Anesthetic Topical Lidocaine 4% cream to wound bed prior to debridement 3. Peri-wound Care: Antifungal cream 4. Dressing Applied: Trost, [place] ([id number]) Other dressing (specify in notes) 5. Secondary Dressing Applied ABD Pad Kerlix/Conform 7. Secured with Tape Notes silvercel Electronic Signature(s) Signed: 04/18/2017 5:27:42 PM By: Gretta Cool, BSN, RN, CWS, Kim RN, BSN Entered By: Gretta Cool, BSN, RN, CWS, Kim on 04/18/2017 09:48:03 Vanderzee, [name] ([id number]) -------------------------------------------------------------------------------- Wound Assessment Details Patient Name: Ploeger, Trice E. Date of Service:  04/18/2017 9:15 AM Medical Record Number: [id number] Patient Account Number: [id number] Date of Birth/Sex: 01-27-1951 (67 y.o. Male) Treating RN: Cornell Barman Primary Care Sherrye Puga: Myrtie Hawk Other Clinician: Referring Leslyn Monda: Myrtie Hawk Treating Candler Ginsberg/Extender: Melburn Hake, HOYT Weeks in Treatment: 29 Wound Status Wound Number: 2 Primary Diabetic Wound/Ulcer of the Lower Etiology: Extremity Wound Location: Right Toe Second Wound Status: Open Wounding Event: Gradually Appeared Date Acquired: 06/11/2016 Weeks Of Treatment: 29 Clustered Wound: No Pending Amputation On Presentation Photos Photo Uploaded By: Gretta Cool, BSN, RN, CWS, Kim on 04/18/2017 10:00:40 Wound Measurements Length: (cm) 2 Width: (cm) 2.5 Depth: (cm) 0.1 Area: (cm) 3.927 Volume: (cm) 0.393 % Reduction in Area: -27.9% % Reduction in Volume: -28% Wound Description Classification: Grade 1 Periwound Skin Texture Texture Color No Abnormalities Noted: No No Abnormalities Noted: No Moisture No Abnormalities Noted: No Treatment Notes Wound #2 (Right Toe Second) 1. Cleansed with: Clean wound with Normal Saline 2. Anesthetic Topical Lidocaine 4% cream to wound bed prior to debridement 3. Peri-wound Care: Antifungal cream 4. Dressing Applied: Milam, [place] ([id number]) Other dressing (specify in notes) 5. Secondary Dressing Applied ABD Pad Kerlix/Conform 7. Secured with Tape Notes silvercel Electronic Signature(s) Signed: 04/18/2017 5:27:42 PM By: Gretta Cool, BSN, RN, CWS, Kim RN, BSN Entered By: Gretta Cool, BSN, RN, CWS, Kim on 04/18/2017 09:48:03 Copelan, [name] ([id number]) -------------------------------------------------------------------------------- Wound Assessment Details Patient Name: Catoe, Saagar E. Date of Service: 04/18/2017 9:15 AM Medical Record Number: [id number] Patient Account Number: [id number] Date of Birth/Sex: [birth year] (67 y.o. Male) Treating RN: Cornell Barman Primary  Care Nazaire Cordial: Myrtie Hawk Other Clinician: Referring Keeyon Privitera: Myrtie Hawk Treating Nahomi Hegner/Extender: Melburn Hake, HOYT Weeks in Treatment: 29 Wound Status Wound Number: 5 Primary Diabetic Wound/Ulcer of the Lower Etiology: Extremity Wound Location: Left Toe Great Wound Status: Open Wounding Event: Gradually Appeared Date Acquired: 06/11/2016 Weeks Of Treatment:  29 Clustered Wound: No Pending Amputation On Presentation Photos Photo Uploaded By: Gretta Cool, BSN, RN, CWS, Kim on 04/18/2017 10:00:40 Wound Measurements Length: (cm) 1 Width: (cm) 1.5 Depth: (cm) 0.1 Area: (cm) 1.178 Volume: (cm) 0.118 % Reduction in Area: 91.8% % Reduction in Volume: 91.7% Wound Description Classification: Grade 1 Periwound Skin Texture Texture Color No Abnormalities Noted: No No Abnormalities Noted: No Moisture No Abnormalities Noted: No Treatment Notes Wound #5 (Left Toe Great) 1. Cleansed with: Clean wound with Normal Saline 2. Anesthetic Topical Lidocaine 4% cream to wound bed prior to debridement 3. Peri-wound Care: Antifungal cream 4. Dressing Applied: [place]cock, [place] ([id number]) Other dressing (specify in notes) 5. Secondary Dressing Applied ABD Pad Kerlix/Conform 7. Secured with Tape Notes silvercel Electronic Signature(s) Signed: 04/18/2017 5:27:42 PM By: Gretta Cool, BSN, RN, CWS, Kim RN, BSN Entered By: Gretta Cool, BSN, RN, CWS, Kim on 04/18/2017 09:48:04 Sauerwein, [name] ([id number]) -------------------------------------------------------------------------------- Wound Assessment Details Patient Name: Tesmer, Latravion E. Date of Service: 04/18/2017 9:15 AM Medical Record Number: [id number] Patient Account Number: [id number] Date of Birth/Sex: [birth year] (67 y.o. Male) Treating RN: Cornell Barman Primary Care Jirah Rider: Myrtie Hawk Other Clinician: Referring Deanglo Hissong: Myrtie Hawk Treating Ellianna Ruest/Extender: Melburn Hake, HOYT Weeks in Treatment:  29 Wound Status Wound Number: 6 Primary Diabetic Wound/Ulcer of the Lower Etiology: Extremity Wound Location: Left Toe Second Wound Status: Open Wounding Event: Gradually Appeared Date Acquired: 06/11/2016 Weeks Of Treatment: 29 Clustered Wound: No Pending Amputation On Presentation Photos Photo Uploaded By: Gretta Cool, BSN, RN, CWS, Kim on 04/18/2017 10:02:11 Wound Measurements Length: (cm) 1 Width: (cm) 1.2 Depth: (cm) 0.1 Area: (cm) 0.942 Volume: (cm) 0.094 % Reduction in Area: 55.9% % Reduction in Volume: 56.1% Wound Description Classification: Grade 1 Periwound Skin Texture Texture Color No Abnormalities Noted: No No Abnormalities Noted: No Moisture No Abnormalities Noted: No Treatment Notes Wound #6 (Left Toe Second) 1. Cleansed with: Clean wound with Normal Saline 2. Anesthetic Topical Lidocaine 4% cream to wound bed prior to debridement 3. Peri-wound Care: Antifungal cream 4. Dressing Applied: [place], Blair ([id number]) Other dressing (specify in notes) 5. Secondary Dressing Applied ABD Pad Kerlix/Conform 7. Secured with Tape Notes [employer]cel Electronic Signature(s) Signed: 04/18/2017 5:27:42 PM By: Gretta Cool, BSN, RN, CWS, Kim RN, BSN Entered By: Gretta Cool, BSN, RN, CWS, Kim on 04/18/2017 09:48:04 Gorney, [name] ([id number]) -------------------------------------------------------------------------------- Wound Assessment Details Patient Name: Layfield, Leaf E. Date of Service: 04/18/2017 9:15 AM Medical Record Number: [id number] Patient Account Number: [id number] Date of Birth/Sex: [birth year] (67 y.o. Male) Treating RN: Cornell Barman Primary Care Lottie Siska: Myrtie Hawk Other Clinician: Referring Ilanna Deihl: Myrtie Hawk Treating Lori Popowski/Extender: Melburn Hake, HOYT Weeks in Treatment: 29 Wound Status Wound Number: 8 Primary Diabetic Wound/Ulcer of the Lower Etiology: Extremity Wound Location: Left Toe Third Wound Status: Healed -  Epithelialized Wounding Event: Gradually Appeared Date Acquired: 12/30/2016 Weeks Of Treatment: 15 Clustered Wound: No Photos Photo Uploaded By: Gretta Cool, BSN, RN, CWS, Kim on 04/18/2017 10:02:11 Wound Measurements Length: (cm) 0 % Redu Width: (cm) 0 % Redu Depth: (cm) 0 Area: (cm) 0 Volume: (cm) 0 ction in Area: 100% ction in Volume: 100% Wound Description Classification: Grade 1 Periwound Skin Texture Texture Color No Abnormalities Noted: No No Abnormalities Noted: No Moisture No Abnormalities Noted: No Electronic Signature(s) Signed: 04/18/2017 5:27:42 PM By: Gretta Cool, BSN, RN, CWS, Kim RN, BSN Entered By: Gretta Cool, BSN, RN, CWS, Kim on 04/18/2017 09:48:05 Boyadjian, [name] ([id number]) -------------------------------------------------------------------------------- Wound Assessment Details Patient Name: Fuerstenberg, Lajuan E. Date of Service: 04/18/2017 9:15 AM Medical Record Number: [id number] Patient  Account Number: [id number] Date of Birth/Sex: 1950/07/03 (67 y.o. Male) Treating RN: Cornell Barman Primary Care Ashdon Gillson: Myrtie Hawk Other Clinician: Referring Alexzandria Massman: Myrtie Hawk Treating Korie Streat/Extender: Melburn Hake, HOYT Weeks in Treatment: 29 Wound Status Wound Number: 9 Primary Diabetic Wound/Ulcer of the Lower Etiology: Extremity Wound Location: Right Toe Third Wound Status: Open Wounding Event: Gradually Appeared Date Acquired: 12/31/2016 Weeks Of Treatment: 15 Clustered Wound: No Photos Photo Uploaded By: Gretta Cool, BSN, RN, CWS, Kim on 04/18/2017 10:02:29 Wound Measurements Length: (cm) 1 Width: (cm) 0.8 Depth: (cm) 0.1 Area: (cm) 0.628 Volume: (cm) 0.063 % Reduction in Area: -59.8% % Reduction in Volume: -61.5% Wound Description Classification: Grade 1 Periwound Skin Texture Texture Color No Abnormalities Noted: No No Abnormalities Noted: No Moisture No Abnormalities Noted: No Treatment Notes Wound #9 (Right Toe Third) 1. Cleansed  with: Clean wound with Normal Saline 2. Anesthetic Topical Lidocaine 4% cream to wound bed prior to debridement 3. Peri-wound Care: Antifungal cream 4. Dressing Applied: Other dressing (specify in notes) Maraj, Tedd E. ([id number]) 5. Secondary Dressing Applied ABD Pad Kerlix/Conform 7. Secured with Tape Notes [employer]cel Electronic Signature(s) Signed: 04/18/2017 5:27:42 PM By: Gretta Cool, BSN, RN, CWS, Kim RN, BSN Entered By: Gretta Cool, BSN, RN, CWS, Kim on 04/18/2017 09:48:06 Chamber, [name] ([id number]) -------------------------------------------------------------------------------- Vitals Details Patient Name: Leabo, Deano E. Date of Service: 04/18/2017 9:15 AM Medical Record Number: [id number] Patient Account Number: [id number] Date of Birth/Sex: June 09, 1950 (67 y.o. Male) Treating RN: Cornell Barman Primary Care Moses Odoherty: Myrtie Hawk Other Clinician: Referring Valoria Tamburri: Myrtie Hawk Treating Kristelle Cavallaro/Extender: Melburn Hake, HOYT Weeks in Treatment: 29 Vital Signs Time Taken: 09:38 Temperature (F): 97.6 Height (in): 69 Pulse (bpm): 64 Weight (lbs): 168 Respiratory Rate (breaths/min): 16 Body Mass Index (BMI): 24.8 Blood Pressure (mmHg): 160/76 Reference Range: 80 - 120 mg / dl Electronic Signature(s) Signed: 04/18/2017 5:27:42 PM By: Gretta Cool, BSN, RN, CWS, Kim RN, BSN Entered By: Gretta Cool, BSN, RN, CWS, Kim on 04/18/2017 09:38:53

## 2017-04-22 NOTE — Progress Notes (Signed)
SEANN, GENTHER ([id number]) Visit Report for 04/18/2017 Chief Complaint Document Details Patient Name: [name], [name] Date of Service: 04/18/2017 9:15 AM Medical Record Number: [id number] Patient Account Number: [id number] Date of Birth/Sex: [birth year] (67 y.o. Male) Treating RN: Cornell Barman Primary Care Provider: Myrtie Hawk Other Clinician: Referring Provider: Myrtie Hawk Treating Provider/Extender: Melburn Hake, Story Vanvranken Weeks in Treatment: 29 Information Obtained from: Patient Chief Complaint Patients presents for treatment of an open diabetic ulcer to both feet Electronic Signature(s) Signed: 04/19/2017 8:05:24 AM By: Worthy Keeler PA-C Entered By: Worthy Keeler on 04/18/2017 09:44:06 Korber, [name] ([id number]) -------------------------------------------------------------------------------- HPI Details Patient Name: [name], [name] Date of Service: 04/18/2017 9:15 AM Medical Record Number: [id number] Patient Account Number: [id number] Date of Birth/Sex: Oct 12, 1950 (67 y.o. Male) Treating RN: Cornell Barman Primary Care Provider: Myrtie Hawk Other Clinician: Referring Provider: Myrtie Hawk Treating Provider/Extender: Melburn Hake, Shigeko Manard Weeks in Treatment: 29 History of Present Illness Location: bilateral feet ulceration on the toes Quality: Patient reports experiencing a dull pain to affected area(s). Severity: Patient states wound are getting better Duration: Patient has had the wound for > 3 months prior to seeking treatment at the wound center Timing: Pain in wound is constant (hurts all the time) Context: The wound would happen gradually Modifying Factors: Other treatment(s) tried include:treatment for lymphedema and is seen by the podiatrist Dr. Caryl Comes Associated Signs and Symptoms: Patient reports having increase swelling. HPI Description: 67 year old patient here to see as for bilateral feet ulceration to on his left first and second toe and  2 on his right first and second toe, which she's had for about 4 months. 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 Donella Stade. The patient is also undergoing workup by medical oncology for a intra-abdominal lymphadenopathy. 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 He is also status post appendectomy, inguinal lymph node biopsy, rectal biopsy and rectal examination under anesthesia. he currently smokes cigarettes about half packet a day. In March of this year he was seen by Dr. Hortencia Pilar, for evaluation of bilateral varicose veins and besides wearing compression stockings he had recommended laser ablation of the right and left great saphenous veins to eleviate the symptoms and complications of severe superficial venous reflux disease. He also recommended lymphedema pumps for better control of his lymphedema. The patient recently has had on 08/23/2016, right greater saphenous vein ablation with the laser energy Earlier lower extremity venous reflux examination done on 05/08/2016 showed no DVT or SVT both lower legs but incompetence of bilateral great saphenous veins was present. A lower arterial study was also done and there was no significant right lower and left lower extremity problems based on a normal toe brachial index bilaterally and the ABI was 1.21 the left and 1.23 on the right. His post ablation venous duplex examination showed successful ablation of the right GS vein with thrombus formation 2 below the right saphenofemoral junction. The deep system was patent without evidence of thrombosis and this was done on 08/30/2016. the patient also has a squamous cell cancer of the skin of the buttock and is recently undergone radiation therapy  for this prior to excisional surgery. Addendum: regarding his x-rays done today and x-ray of the left foot -- IMPRESSION: No objective evidence of osteomyelitis. There  are soft tissue changes which may reflect cellulitis. X-ray of the right foot -- IMPRESSION:Findings compatible with cellulitis of the toes. No objective evidence of osteomyelitis is observed. 10/01/16 on evaluation today patient's wounds appeared to be doing some better. I did review the x-rays as well which showed no evidence of osteomyelitis although there was evidence on x-ray of cellulitis. He fortunately is not having any discomfort although he continues to have some swelling. He does not remember being on any antibiotics recently. 10/15/16 on evaluation today patient's wounds overall appear to be doing better although he does have a new location noted on the left foot. Fortunately he is not having significant pain. It almost has the appearance that something is rubbing on the end of his toes but he wears the open toe shoes and according to what he is telling me never wears anything that would rub on his foot. There is no evidence of infection and specifically no evidence of a fungal infection 10/22/16 On evaluation today patient's wounds appeared to be doing better compared to last week in regard to his bilateral [name], [name] ([id number]) lower extremities. Fortunately I happy with how things are progressing although he still has ulcers I feel like that he is improving and appropriate manner. 11/12/16 on evaluation today patient appears to be doing well in regard to his bilateral feet and the respective wounds. We have been using surrounding her dressings along with an antifungal cream which seems to be doing very well. He has no bilateral dysfunction noticed that the rituals are weight loss at this point. He also has no nausea or vomiting a note purulent discharge. He did see Vein and vascular today and he tells me that they  told him he could have surgery for his venous stasis but they did not feel like it was worth it in his words. Fortunately patient's wounds do appear to be getting sneakily better. 11/26/2016 -- he says he is going to have some surgery during this week at [facility] for possibly a colon resection. 12/31/2016 -- the patient has been noncompliant with his smoking and I'm not sure whether he is also started drinking again. He continues to be very nonchalant about his care 01/14/2017 -- the patient's HandP has been reviewed well and I understand he is being compliant with trying to give up smoking and his local dressing changes. He does not have any surgical options of 4 to him by his vascular surgeons.he was last seen in early August by Dr. Hortencia Pilar who recommended compression stockings,and possibly lymph pumps in 2-3 months after doing a review ultrasound. 01/28/2017 - the patient did not have any fresh complaints but on examination I noted a large lacerated wound on the plantar aspect of his right fourth toe which had a lot of necrotic debris and it probes down to bone. 02/07/2017 -- x-ray of the right foot -- IMPRESSION: Soft tissue swelling about the first through fourth toes consistent with cellulitis. New destructive change in the tuft of the distal phalanx of the great toe is consistent with osteomyelitis. 02/14/2017 -- the patient's MRI is pending this coming Monday and he still continues to smoke. We have again gone over off loading of his wounds in great detail and he says he's been compliant. 02/21/2017 -- MR of the right foot -- IMPRESSION: 1. Soft tissue ulcer at the tip of the first, second and third toe knows. Cortical irregularity and bone marrow edema in the first distal phalanx most concerning  for osteomyelitis. Mild marrow edema in the second and third distal phalanx without definite cortical destruction which may reflect early osteomyelitis versus reactive marrow  edema. 2. Soft tissue edema surrounding the first phalanx most consistent with cellulitis. the patient was also recently evaluated by his medical oncologist Dr. 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 She is treating him with weekly Procrit. She is also keeping intra-abdominal lymphadenopathy and right lower lobe lung nodule under observation. 04/04/2017 -- he was seen by Dr. Adrian Prows on 03/25/2017 -- after review he empirically put him on ciprofloxacin and doxycycline as they have good bone penetration and good bioavailability and it will cover the usual pathogens and diabetic foot osteomyelitis. He will check inflammatory markers and plan a 46-28 week old records. C-reactive protein was 0.3 and the ESR was 72 04/18/17 on evaluation today patient appears to be doing about the same in regard to his lower extremity wounds bilaterally. He has continued to use the antifungal cream which does seem to be beneficial. Nonetheless the ulcers do seem to in some areas be epithelial eyes over and in other areas are still open. He is having no significant discomfort. Electronic Signature(s) Signed: 04/19/2017 8:05:24 AM By: Worthy Keeler PA-C Entered By: Worthy Keeler on 04/18/2017 17:56:30 Sear, [name] ([id number]) -------------------------------------------------------------------------------- Physical Exam Details Patient Name: Kempker, Kline E. Date of Service: 04/18/2017 9:15 AM Medical Record Number: [id number] Patient Account Number: [id number] Date of Birth/Sex: October 19, 1950 (67 y.o. Male) Treating RN: Cornell Barman Primary Care Provider: Myrtie Hawk Other Clinician: Referring Provider: Myrtie Hawk Treating Provider/Extender: STONE III, Danilo Cappiello Weeks in Treatment: 52 Constitutional Well-nourished and well-hydrated in no acute distress. Respiratory normal breathing without difficulty. clear to  auscultation bilaterally. Cardiovascular regular rate and rhythm with normal S1, S2. 1+ pitting edema of the bilateral lower extremities. Psychiatric this patient is able to make decisions and demonstrates good insight into disease process. Alert and Oriented x 3. pleasant and cooperative. Notes At this point patient's wounds do not appear to be significantly deep which is good news. Seems to have tolerated the antibiotics well without complication. Overall I'm pleased with were things stand and how they appear at this time. Electronic Signature(s) Signed: 04/19/2017 8:05:24 AM By: Worthy Keeler PA-C Entered By: Worthy Keeler on 04/18/2017 17:57:20 Ihnen, [name] ([id number]) -------------------------------------------------------------------------------- Physician Orders Details Patient Name: [name], [name] Date of Service: 04/18/2017 9:15 AM Medical Record Number: [id number] Patient Account Number: [id number] Date of Birth/Sex: 06-22-1950 (67 y.o. Male) Treating RN: Cornell Barman Primary Care Provider: Myrtie Hawk Other Clinician: Referring Provider: Myrtie Hawk Treating Provider/Extender: Melburn Hake, Anthoney Sheppard Weeks in Treatment: 75 Verbal / Phone Orders: No Diagnosis Coding ICD-10 Coding Code Description E11.621 Type 2 diabetes mellitus with foot ulcer I87.313 Chronic venous hypertension (idiopathic) with ulcer of bilateral lower extremity I89.0 Lymphedema, not elsewhere classified L97.522 Non-pressure chronic ulcer of other part of left foot with fat layer exposed L97.512 Non-pressure chronic ulcer of other part of right foot with fat layer exposed F17.218 Nicotine dependence, cigarettes, 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 abuse 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, right ankle and foot Wound Cleansing Wound #1 Right Toe Great o Clean wound with Normal Saline. o May Shower, gently pat wound  dry prior to applying new dressing. Wound #2 Right Toe Second o Clean wound with Normal Saline. o May Shower, gently pat wound dry prior to applying new dressing. Wound #  5 Left Toe Great o Clean wound with Normal Saline. o May Shower, gently pat wound dry prior to applying new dressing. Wound #6 Left Toe Second o Clean wound with Normal Saline. o May Shower, gently pat wound dry prior to applying new dressing. Wound #9 Right Toe Third o Clean wound with Normal Saline. o May Shower, gently pat wound dry prior to applying new dressing. Skin Barriers/Peri-Wound Care Wound #1 Right Toe Great o Antifungal cream Wound #2 Right Toe Second o Antifungal cream Wound #5 Left Toe Great o Antifungal cream Guilmette, Braysen E. ([id number]) Wound #6 Left Toe Second o Antifungal cream Wound #9 Right Toe Third o Antifungal cream Primary Wound Dressing Wound #1 Right Toe Great o Silvercel Non-Adherent o Other: - nystatin Wound #2 Right Toe Second o [place]cel Non-Adherent o Other: - nystatin Wound #5 Left Toe Great o Silvercel Non-Adherent o Other: - nystatin Wound #6 Left Toe Second o Silvercel Non-Adherent o Other: - nystatin Wound #9 Right Toe Third o Silvercel Non-Adherent o Other: - nystatin Secondary Dressing Wound #1 Right Toe Great o ABD and Kerlix/Conform Wound #2 Right Toe Second o ABD and Kerlix/Conform Wound #5 Left Toe Great o ABD and Kerlix/Conform Wound #6 Left Toe Second o ABD and Kerlix/Conform Wound #9 Right Toe Third o ABD and Kerlix/Conform Dressing Change Frequency Wound #1 Right Toe Great o Change dressing every day. Wound #2 Right Toe Second o Change dressing every day. Wound #5 Left Toe Great o Change dressing every day. Wound #6 Left Toe Second o Change dressing every day. BRANDN, MCGATH. ([id number]) Wound #9 Right Toe Third o Change dressing every day. Follow-up Appointments Wound #1  Right Toe Great o Return Appointment in 1 week. Wound #2 Right Toe Second o Return Appointment in 1 week. Wound #5 Left Toe Great o Return Appointment in 1 week. Wound #6 Left Toe Second o Return Appointment in 1 week. Wound #9 Right Toe Third o Return Appointment in 1 week. Additional Orders / Instructions Wound #1 Right Toe Great o Stop Smoking o Increase protein intake. o Other: - Please add vitamin A, vitamin C and zinc supplements to your diet Wound #2 Right Toe Second o Stop Smoking o Increase protein intake. o Other: - Please add vitamin A, vitamin C and zinc supplements to your diet Wound #5 Left Toe Great o Stop Smoking o Increase protein intake. o Other: - Please add vitamin A, vitamin C and zinc supplements to your diet Wound #6 Left Toe Second o Stop Smoking o Increase protein intake. o Other: - Please add vitamin A, vitamin C and zinc supplements to your diet Wound #9 Right Toe Third o Stop Smoking o Increase protein intake. o Other: - Please add vitamin A, vitamin C and zinc supplements to your diet Electronic Signature(s) Signed: 04/18/2017 11:03:36 AM By: Gretta Cool, BSN, RN, CWS, Kim RN, BSN Signed: 04/19/2017 8:05:24 AM By: Worthy Keeler PA-C Entered By: Gretta Cool, BSN, RN, CWS, Kim on 04/18/2017 11:03:34 [place]bank, [name] ([id number]) -------------------------------------------------------------------------------- Problem List Details Patient Name: Jaquith, Jrake E. Date of Service: 04/18/2017 9:15 AM Medical Record Number: [id number] Patient Account Number: [id number] Date of Birth/Sex: Feb 01, 1951 (67 y.o. Male) Treating RN: Cornell Barman Primary Care Provider: Myrtie Hawk Other Clinician: Referring Provider: Myrtie Hawk Treating Provider/Extender: Melburn Hake, Siniya Lichty Weeks in Treatment: 29 Active Problems ICD-10 Encounter Code Description Active Date Diagnosis E11.621 Type 2 diabetes mellitus with foot  ulcer 09/21/2016 Yes I87.313 Chronic venous hypertension (idiopathic) with ulcer of bilateral 09/21/2016  Yes lower extremity I89.0 Lymphedema, not elsewhere classified 09/21/2016 Yes L97.522 Non-pressure chronic ulcer of other part of left foot with fat layer 09/21/2016 Yes exposed L97.512 Non-pressure chronic ulcer of other part of right foot with fat layer 09/21/2016 Yes exposed F17.218 Nicotine dependence, cigarettes, with other nicotine-induced 09/21/2016 Yes disorders F10.19 Alcohol abuse with unspecified alcohol-induced disorder 09/21/2016 Yes M86.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, right ankle and foot 02/21/2017 Yes Inactive Problems Resolved Problems Electronic Signature(s) Signed: 04/19/2017 8:05:24 AM By: Worthy Keeler PA-C Entered By: Worthy Keeler on 04/18/2017 09:43:57 Baney, [name] ([id number]) -------------------------------------------------------------------------------- Progress Note Details Patient Name: [name], [name] Date of Service: 04/18/2017 9:15 AM Medical Record Number: [id number] Patient Account Number: [id number] Date of Birth/Sex: January 04, 1951 (67 y.o. Male) Treating RN: Cornell Barman Primary Care Provider: Myrtie Hawk Other Clinician: Referring Provider: Myrtie Hawk Treating Provider/Extender: Melburn Hake, Jayel Scaduto Weeks in Treatment: 29 Subjective Chief Complaint Information obtained from Patient Patients presents for treatment of an open diabetic ulcer to both feet History of Present Illness (HPI) The following HPI elements were documented for the patient's wound: Location: bilateral feet ulceration on the toes Quality: Patient reports experiencing a dull pain to affected area(s). Severity: Patient states wound are getting better Duration: Patient has had the wound for > 3 months prior to seeking treatment at the wound center Timing: Pain in wound is constant (hurts all the time) Context: The wound would happen gradually Modifying  Factors: Other treatment(s) tried include:treatment for lymphedema and is seen by the podiatrist Dr. Caryl Comes Associated Signs and Symptoms: Patient reports having increase swelling. 67 year old patient here to see as for bilateral feet ulceration to on his left first and second toe and 2 on his right first and second toe, which she's had for about 4 months. 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 Donella Stade. The patient is also undergoing workup by medical oncology for a intra-abdominal lymphadenopathy. 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 He is also status post appendectomy, inguinal lymph node biopsy, rectal biopsy and rectal examination under anesthesia. he currently smokes cigarettes about half packet a day. In March of this year he was seen by Dr. Hortencia Pilar, for evaluation of bilateral varicose veins and besides wearing compression stockings he had recommended laser ablation of the right and left great saphenous veins to eleviate the symptoms and complications of severe superficial venous reflux disease. He also recommended lymphedema pumps for better control of his lymphedema. The patient recently has had on 08/23/2016, right greater saphenous vein ablation with the laser energy Earlier lower extremity venous reflux examination done on 05/08/2016 showed no DVT or SVT both lower legs but incompetence of bilateral great saphenous veins was present. A lower arterial study was also done and there was no significant right lower and left lower extremity problems based on a normal toe brachial index bilaterally and the ABI was 1.21 the left and 1.23 on the right. His post ablation venous duplex examination showed successful ablation of the  right GS vein with thrombus formation 2 below the right saphenofemoral junction. The deep system was patent without evidence of thrombosis and this was done on 08/30/2016. the patient also has a squamous cell cancer of the skin of the buttock and is recently undergone radiation therapy for this  prior to excisional surgery. Addendum: regarding his x-rays done today and x-ray of the left foot -- IMPRESSION: No objective evidence of osteomyelitis. There are soft tissue changes which may reflect cellulitis. X-ray of the right foot -- IMPRESSION:Findings compatible with cellulitis of the toes. No objective evidence of osteomyelitis is observed. 10/01/16 on evaluation today patient's wounds appeared to be doing some better. I did review the x-rays as well which showed no evidence of osteomyelitis although there was evidence on x-ray of cellulitis. He fortunately is not having any discomfort Bartek, Bram E. ([id number]) although he continues to have some swelling. He does not remember being on any antibiotics recently. 10/15/16 on evaluation today patient's wounds overall appear to be doing better although he does have a new location noted on the left foot. Fortunately he is not having significant pain. It almost has the appearance that something is rubbing on the end of his toes but he wears the open toe shoes and according to what he is telling me never wears anything that would rub on his foot. There is no evidence of infection and specifically no evidence of a fungal infection 10/22/16 On evaluation today patient's wounds appeared to be doing better compared to last week in regard to his bilateral lower extremities. Fortunately I happy with how things are progressing although he still has ulcers I feel like that he is improving and appropriate manner. 11/12/16 on evaluation today patient appears to be doing well in regard to his bilateral feet and the respective wounds. We have been using surrounding her  dressings along with an antifungal cream which seems to be doing very well. He has no bilateral dysfunction noticed that the rituals are weight loss at this point. He also has no nausea or vomiting a note purulent discharge. He did see Vein and vascular today and he tells me that they told him he could have surgery for his venous stasis but they did not feel like it was worth it in his words. Fortunately patient's wounds do appear to be getting sneakily better. 11/26/2016 -- he says he is going to have some surgery during this week at [facility] for possibly a colon resection. 12/31/2016 -- the patient has been noncompliant with his smoking and I'm not sure whether he is also started drinking again. He continues to be very nonchalant about his care 01/14/2017 -- the patient's HandP has been reviewed well and I understand he is being compliant with trying to give up smoking and his local dressing changes. He does not have any surgical options of 4 to him by his vascular surgeons.he was last seen in early August by Dr. Hortencia Pilar who recommended compression stockings,and possibly lymph pumps in 2-3 months after doing a review ultrasound. 01/28/2017 - the patient did not have any fresh complaints but on examination I noted a large lacerated wound on the plantar aspect of his right fourth toe which had a lot of necrotic debris and it probes down to bone. 02/07/2017 -- x-ray of the right foot -- IMPRESSION: Soft tissue swelling about the first through fourth toes consistent with cellulitis. New destructive change in the tuft of the distal phalanx of the great toe is consistent with osteomyelitis. 02/14/2017 -- the patient's MRI is pending this coming Monday and he still continues to smoke. We have again gone over off loading of his wounds in great detail and he says he's been compliant. 02/21/2017 -- MR of the right foot -- IMPRESSION: 1. Soft tissue ulcer  at the tip of the first, second and  third toe knows. Cortical irregularity and bone marrow edema in the first distal phalanx most concerning for osteomyelitis. Mild marrow edema in the second and third distal phalanx without definite cortical destruction which may reflect early osteomyelitis versus reactive marrow edema. 2. Soft tissue edema surrounding the first phalanx most consistent with cellulitis. the patient was also recently evaluated by his medical oncologist Dr. 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 She is treating him with weekly Procrit. She is also keeping intra-abdominal lymphadenopathy and right lower lobe lung nodule under observation. 04/04/2017 -- he was seen by Dr. Adrian Prows on 03/25/2017 -- after review he empirically put him on ciprofloxacin and doxycycline as they have good bone penetration and good bioavailability and it will cover the usual pathogens and diabetic foot osteomyelitis. He will check inflammatory markers and plan a 56-73 week old records. C-reactive protein was 0.3 and the ESR was 72 04/18/17 on evaluation today patient appears to be doing about the same in regard to his lower extremity wounds bilaterally. He has continued to use the antifungal cream which does seem to be beneficial. Nonetheless the ulcers do seem to in some areas be epithelial eyes over and in other areas are still open. He is having no significant discomfort. Patient History Information obtained from Patient. Social History Current every day smoker, Marital Status - Widowed, Alcohol Use - Daily - quit drinking about a week ago, Drug Use - No [name], [name] ([id number]) History, Caffeine Use - Moderate. 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 Respiratory The  patient has no complaints or symptoms. Cardiovascular Complains or has symptoms of LE edema. Objective Constitutional Well-nourished and well-hydrated in no acute distress. Vitals Time Taken: 9:38 AM, Height: 69 in, Weight: 168 lbs, BMI: 24.8, Temperature: 97.6 F, Pulse: 64 bpm, Respiratory Rate: 16 breaths/min, Blood Pressure: 160/76 mmHg. Respiratory normal breathing without difficulty. clear to auscultation bilaterally. Cardiovascular regular rate and rhythm with normal S1, S2. 1+ pitting edema of the bilateral lower extremities. Psychiatric this patient is able to make decisions and demonstrates good insight into disease process. Alert and Oriented x 3. pleasant and cooperative. General Notes: At this point patient's wounds do not appear to be significantly deep which is good news. Seems to have tolerated the antibiotics well without complication. Overall I'm pleased with were things stand and how they appear at this time. Integumentary (Hair, Skin) Wound #1 status is Open. Original cause of wound was Gradually Appeared. The wound is located on the Right Toe Great. The wound measures 1cm length x 1.4cm width x 0.1cm depth; 1.1cm^2 area and 0.11cm^3 volume. Wound #2 status is Open. Original cause of wound was Gradually Appeared. The wound is located on the Right Toe Second. The wound measures 2cm length x 2.5cm width x 0.1cm depth; 3.927cm^2 area and 0.393cm^3 volume. Wound #5 status is Open. Original cause of wound was Gradually Appeared. The wound is located on the Left Toe Great. The wound measures 1cm length x 1.5cm width x 0.1cm depth; 1.178cm^2 area and 0.118cm^3 volume. Wound #6 status is Open. Original cause of wound was Gradually Appeared. The wound is located on the Left Toe Second. The wound measures 1cm length x 1.2cm width x 0.1cm depth; 0.942cm^2 area and 0.094cm^3  volume. KYRUS, HYDE ([id number]) Wound #8 status is Healed - Epithelialized. Original cause of wound  was Gradually Appeared. The wound is located on the Left Toe Third. The wound measures 0cm length x 0cm width x 0cm depth; 0cm^2 area and 0cm^3 volume. Wound #9 status is Open. Original cause of wound was Gradually Appeared. The wound is located on the Right Toe Third. The wound measures 1cm length x 0.8cm width x 0.1cm depth; 0.628cm^2 area and 0.063cm^3 volume. 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 May Shower, gently pat wound dry prior to applying new dressing. Wound #2 Right Toe Second: Clean wound with Normal Saline. May Shower, gently pat wound dry prior to applying new dressing. Wound #5 Left Toe Great: Clean wound with Normal Saline. May Shower, gently pat wound dry prior to applying new dressing. Wound #6 Left Toe Second: Clean wound with Normal Saline. May Shower, gently pat wound dry prior to applying new dressing. Wound #9 Right Toe Third: Clean wound with Normal Saline. May Shower, gently pat wound dry prior to applying new dressing. Skin Barriers/Peri-Wound Care: Wound #1 Right Toe Great: Antifungal cream Wound #2 Right Toe Second: Antifungal cream Wound #5 Left Toe Great: Antifungal cream Wound #6 Left Toe Second: Antifungal cream Wound #9 Right Toe Third: Antifungal cream Primary Wound Dressing: Magri, Aydien E.  ([id number]) Wound #1 Right Toe Great: Silvercel Non-Adherent Other: - nystatin Wound #2 Right Toe Second: Silvercel Non-Adherent Other: - nystatin Wound #5 Left Toe Great: Silvercel Non-Adherent Other: - nystatin Wound #6 Left Toe Second: Silvercel Non-Adherent Other: - nystatin Wound #9 Right Toe Third: Silvercel Non-Adherent Other: - nystatin Secondary Dressing: Wound #1 Right Toe Great: ABD and Kerlix/Conform Wound #2 Right Toe Second: ABD and Kerlix/Conform Wound #5 Left Toe Great: ABD and Kerlix/Conform Wound #6 Left Toe Second: ABD and Kerlix/Conform Wound #9 Right Toe Third: ABD and Kerlix/Conform Dressing Change Frequency: Wound #1 Right Toe Great: Change dressing every day. Wound #2 Right Toe Second: Change dressing every day. Wound #5 Left Toe Great: Change dressing every day. Wound #6 Left Toe Second: Change dressing every day. Wound #9 Right Toe Third: Change dressing every day. Follow-up Appointments: Wound #1 Right Toe Great: Return Appointment in 1 week. Wound #2 Right Toe Second: Return Appointment in 1 week. Wound #5 Left Toe Great: Return Appointment in 1 week. Wound #6 Left Toe Second: Return Appointment in 1 week. Wound #9 Right Toe Third: Return Appointment in 1 week. Additional Orders / Instructions: Wound #1 Right Toe Great: Stop Smoking Increase protein intake. Other: - Please add vitamin A, vitamin C and zinc supplements to your diet Wound #2 Right Toe Second: Stop Smoking Increase protein intake. Other: - Please add vitamin A, vitamin C and zinc supplements to your diet Wound #5 Left Toe Great: Stop Smoking Increase protein intake. DONTAYE, [name] ([id number]) Other: - Please add vitamin A, vitamin C and zinc supplements to your diet Wound #6 Left Toe Second:  Stop Smoking Increase protein intake. Other: - Please add vitamin A, vitamin C and zinc supplements to your diet Wound #9 Right Toe Third: Stop Smoking Increase  protein intake. Other: - Please add vitamin A, vitamin C and zinc supplements to your diet I am going to recommend that we continue with the Current wound care measures for the next week will see him for follow-up evaluation following. If anything worsened significantly in the interim he will contact our office for additional recommendations. Please see above for specific wound care orders. We will see patient for re-evaluation in 1 week(s) here in the clinic. If anything worsens or changes patient will contact our office for additional recommendations. Electronic Signature(s) Signed: 04/19/2017 8:05:24 AM By: Worthy Keeler PA-C Entered By: Worthy Keeler on 04/18/2017 17:57:48 Sanzo, [name] ([id number]) -------------------------------------------------------------------------------- ROS/PFSH Details Patient Name: [name], [name] Date of Service: 04/18/2017 9:15 AM Medical Record Number: [id number] Patient Account Number: [id number] Date of Birth/Sex: [birth year] (67 y.o. Male) Treating RN: Cornell Barman Primary Care Provider: Myrtie Hawk Other Clinician: Referring Provider: Myrtie Hawk Treating Provider/Extender: Melburn Hake, Neko Mcgeehan Weeks in Treatment: 29 Information Obtained From Patient Wound History Do you currently have one or more open woundso Yes How many open wounds do you currently haveo 6 Approximately how long have you had your woundso 3 months How have you been treating your wound(s) until nowo ointment and bandage Has your wound(s) ever healed and then re-openedo No Have you had any lab work done in the past montho No Have you tested positive for an antibiotic resistant organism (MRSA, VRE)o No Have you tested positive for osteomyelitis (bone infection)o No Have you had any tests for circulation on your legso Yes Who ordered the testo PCP Where was the test doneo [facility] Constitutional Symptoms (General Health) Complaints and Symptoms: Negative for: Fever;  Chills Cardiovascular Complaints and Symptoms: Positive for: LE edema Medical History: Positive for: Congestive Heart Failure; Hypertension; Peripheral Venous Disease Negative for: Angina; Arrhythmia; Coronary Artery Disease; Deep Vein Thrombosis; Hypotension; Myocardial Infarction; Peripheral Arterial Disease; Phlebitis; Vasculitis Eyes Medical History: Negative for: Cataracts; Glaucoma; Optic Neuritis Ear/Nose/Mouth/Throat Medical History: Negative for: Chronic sinus problems/congestion; Middle ear problems Hematologic/Lymphatic Medical History: Positive for: Anemia; Lymphedema Negative for: Hemophilia; Human Immunodeficiency Virus; Sickle Cell Disease Respiratory Antwi, [name] ([id number]) Complaints and Symptoms: No Complaints or Symptoms Medical History: Negative for: Aspiration; Asthma; Chronic Obstructive Pulmonary Disease (COPD); Pneumothorax; Sleep Apnea; Tuberculosis Gastrointestinal Medical History: Negative for: Cirrhosis ; Colitis; Crohnos; Hepatitis A; Hepatitis B; Hepatitis C Endocrine Medical History: Positive for: Type II Diabetes Treated with: Oral agents Blood sugar tested every day: Yes Tested : QD Genitourinary Medical History: Negative for: End Stage Renal Disease Immunological Medical History: Negative for: Lupus Erythematosus; Raynaudos; Scleroderma Integumentary (Skin) Medical History: Negative for: History of Burn; History of pressure wounds Musculoskeletal Medical History: Negative for: Gout; Rheumatoid Arthritis; Osteoarthritis; Osteomyelitis Neurologic Medical History: Positive for: Neuropathy Negative for: Dementia; Quadriplegia; Paraplegia; Seizure Disorder Oncologic Medical History: Past Medical History Notes: 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  ([id number]) Family and Social History Current every day smoker; Marital Status - Widowed; Alcohol Use: Daily - quit drinking about a week ago; Drug Use: No History; Caffeine Use: Moderate; Financial Concerns: No; Food, Clothing or Shelter Needs: No; Support System Lacking: No; Transportation Concerns: No; Advanced Directives: No; Patient does not want information on Advanced Directives Physician  Affirmation I have reviewed and agree with the above information. Electronic Signature(s) Signed: 04/19/2017 8:05:24 AM By: Worthy Keeler PA-C Signed: 04/22/2017 10:28:27 AM By: Gretta Cool, BSN, RN, CWS, Kim RN, BSN Entered By: Worthy Keeler on 04/18/2017 17:56:53 Morin, [name] ([id number]) -------------------------------------------------------------------------------- SuperBill Details Patient Name: Seldon, Almir E. Date of Service: 04/18/2017 Medical Record Number: [id number] Patient Account Number: [id number] Date of Birth/Sex: Feb 07, 1951 (67 y.o. Male) Treating RN: Cornell Barman Primary Care Provider: Myrtie Hawk Other Clinician: Referring Provider: Myrtie Hawk Treating Provider/Extender: Melburn Hake, Yoandri Congrove Weeks in Treatment: 29 Diagnosis Coding ICD-10 Codes Code Description E11.621 Type 2 diabetes mellitus with foot ulcer I87.313 Chronic venous hypertension (idiopathic) with ulcer of bilateral lower extremity I89.0 Lymphedema, not elsewhere classified L97.522 Non-pressure chronic ulcer of other part of left foot with fat layer exposed L97.512 Non-pressure chronic ulcer of other part of right foot with fat layer exposed F17.218 Nicotine dependence, cigarettes, 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 abuse 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, right ankle and foot Facility Procedures CPT4 Code: [id number] Description: 99214 - WOUND CARE VISIT-LEV 4 EST PT Modifier: Quantity: 1 Physician Procedures CPT4 Code  Description: [id number] - WC PHYS LEVEL 3 - EST PT ICD-10 Diagnosis Description E11.621 Type 2 diabetes mellitus with foot ulcer I87.313 Chronic venous hypertension (idiopathic) with ulcer of bilateral I89.0 Lymphedema, not elsewhere  classified L97.522 Non-pressure chronic ulcer of other part of left foot with fat l Modifier: lower extremi ayer exposed Quantity: 1 ty Electronic Signature(s) Signed: 04/19/2017 8:05:24 AM By: Worthy Keeler PA-C Entered By: Worthy Keeler on 04/18/2017 17:58:19

## 2017-04-23 ENCOUNTER — Inpatient Hospital Stay: Payer: Medicare HMO

## 2017-04-23 VITALS — BP 154/76 | HR 71

## 2017-04-23 DIAGNOSIS — D638 Anemia in other chronic diseases classified elsewhere: Secondary | ICD-10-CM

## 2017-04-23 DIAGNOSIS — I130 Hypertensive heart and chronic kidney disease with heart failure and stage 1 through stage 4 chronic kidney disease, or unspecified chronic kidney disease: Secondary | ICD-10-CM | POA: Diagnosis not present

## 2017-04-23 DIAGNOSIS — D509 Iron deficiency anemia, unspecified: Secondary | ICD-10-CM

## 2017-04-23 DIAGNOSIS — Z862 Personal history of diseases of the blood and blood-forming organs and certain disorders involving the immune mechanism: Secondary | ICD-10-CM

## 2017-04-23 DIAGNOSIS — D649 Anemia, unspecified: Secondary | ICD-10-CM

## 2017-04-23 DIAGNOSIS — N189 Chronic kidney disease, unspecified: Secondary | ICD-10-CM

## 2017-04-23 LAB — HEMOGLOBIN: HEMOGLOBIN: 7.7 g/dL — AB (ref 13.0–18.0)

## 2017-04-23 MED ORDER — EPOETIN ALFA 40000 UNIT/ML IJ SOLN
40000.0000 [IU] | INTRAMUSCULAR | Status: DC
  Administered 2017-04-23: 40000 [IU] via SUBCUTANEOUS

## 2017-04-25 ENCOUNTER — Encounter: Payer: Medicare HMO | Admitting: Nurse Practitioner

## 2017-04-25 DIAGNOSIS — E11621 Type 2 diabetes mellitus with foot ulcer: Secondary | ICD-10-CM | POA: Diagnosis not present

## 2017-04-27 NOTE — Progress Notes (Signed)
DAILY, CRATE ([id number]) Visit Report for 04/25/2017 Chief Complaint Document Details Patient Name: [name], [name] Date of Service: 04/25/2017 9:15 AM Medical Record Number: [id number] Patient Account Number: [id number] Date of Birth/Sex: May 07, 1950 (67 y.o. Male) Treating RN: Montey Hora Primary Care Provider: Myrtie Hawk Other Clinician: Referring Provider: Myrtie Hawk Treating Provider/Extender: Cathie Olden in Treatment: 30 Information Obtained from: Patient Chief Complaint Patient presents for treatment of an open diabetic ulcer to both feet Electronic Signature(s) Signed: 04/26/2017 8:35:48 AM By: Lawanda Cousins Entered By: Lawanda Cousins on 04/25/2017 10:15:08 [name], [name] ([id number]) -------------------------------------------------------------------------------- Debridement Details Patient Name: [name], [name] Date of Service: 04/25/2017 9:15 AM Medical Record Number: [id number] Patient Account Number: [id number] Date of Birth/Sex: 1950/09/25 (67 y.o. Male) Treating RN: Montey Hora Primary Care Provider: Myrtie Hawk Other Clinician: Referring Provider: Myrtie Hawk Treating Provider/Extender: Cathie Olden in Treatment: 30 Debridement Performed for Wound #1 Right Toe Great Assessment: Performed By: Physician Lawanda Cousins, NP Debridement: Debridement Severity of Tissue Pre Fat layer exposed Debridement: Pre-procedure Verification/Time Yes - 10:07 Out Taken: Start Time: 10:07 Pain Control: Lidocaine 4% Topical Solution Level: Skin/Subcutaneous Tissue Total Area Debrided (L x W): 1.5 (cm) x 5 (cm) = 7.5 (cm) Tissue and other material Viable, Non-Viable, Fibrin/Slough, Subcutaneous debrided: Instrument: Curette Bleeding: Minimum Hemostasis Achieved: Pressure End Time: 10:08 Procedural Pain: 0 Post Procedural Pain: 0 Response to Treatment: Procedure was tolerated well Post Debridement  Measurements of Total Wound Length: (cm) 1.5 Width: (cm) 6.4 Depth: (cm) 0.2 Volume: (cm) 1.508 Character of Wound/Ulcer Post Debridement: Improved Severity of Tissue Post Debridement: Fat layer exposed Post Procedure Diagnosis Same as Pre-procedure Electronic Signature(s) Signed: 04/25/2017 5:15:39 PM By: Montey Hora Signed: 04/26/2017 8:35:48 AM By: Lawanda Cousins Entered By: Montey Hora on 04/25/2017 10:10:03 [name], [name] -------------------------------------------------------------------------------- Debridement Details Patient Name: [name], [name] Date of Service: 04/25/2017 9:15 AM Medical Record Number: [id number] Patient Account Number: [id number] Date of Birth/Sex: [birth year] (67 y.o. Male) Treating RN: Montey Hora Primary Care Provider: Myrtie Hawk Other Clinician: Referring Provider: Myrtie Hawk Treating Provider/Extender: Cathie Olden in Treatment: 30 Debridement Performed for Wound #2 Right Toe Second Assessment: Performed By: Physician Lawanda Cousins, NP Debridement: Debridement Severity of Tissue Pre Fat layer exposed Debridement: Pre-procedure Verification/Time Yes - 10:08 Out Taken: Start Time: 10:08 Pain Control: Lidocaine 4% Topical Solution Level: Skin/Subcutaneous Tissue Total Area Debrided (L x W): 0.3 (cm) x 0.5 (cm) = 0.15 (cm) Tissue and other material Viable, Non-Viable, Fibrin/Slough, Subcutaneous debrided: Instrument: Curette Bleeding: Minimum Hemostasis Achieved: Pressure End Time: 10:09 Procedural Pain: 0 Post Procedural Pain: 0 Response to Treatment: Procedure was tolerated well Post Debridement Measurements of Total Wound Length: (cm) 0.3 Width: (cm) 0.5 Depth: (cm) 0.2 Volume: (cm) 0.024 Character of Wound/Ulcer Post Debridement: Improved Severity of Tissue Post Debridement: Fat layer exposed Post Procedure Diagnosis Same as Pre-procedure Electronic Signature(s) Signed:  04/25/2017 5:15:39 PM By: Montey Hora Signed: 04/26/2017 8:35:48 AM By: Lawanda Cousins Entered By: Montey Hora on 04/25/2017 10:10:42 [name], [name] ([id number]) -------------------------------------------------------------------------------- Debridement Details Patient Name: [name], [name] Date of Service: 04/25/2017 9:15 AM Medical Record Number: [id number] Patient Account Number: [id number] Date of Birth/Sex: April 26, 1950 (67 y.o. Male) Treating RN: Montey Hora Primary Care Provider: Myrtie Hawk Other Clinician: Referring Provider: Myrtie Hawk Treating Provider/Extender: Cathie Olden in Treatment: 30 Debridement Performed for Wound #9 Right Toe Third Assessment: Performed By: Physician Lawanda Cousins, NP Debridement: Debridement Severity of Tissue Pre Fat layer exposed Debridement: Pre-procedure Verification/Time Yes - 10:09 Out  Taken: Start Time: 10:09 Pain Control: Lidocaine 4% Topical Solution Level: Skin/Subcutaneous Tissue Total Area Debrided (L x W): 0.6 (cm) x 0.3 (cm) = 0.18 (cm) Tissue and other material Viable, Non-Viable, Fibrin/Slough, Subcutaneous debrided: Instrument: Curette Bleeding: Minimum Hemostasis Achieved: Pressure End Time: 10:10 Procedural Pain: 0 Post Procedural Pain: 0 Response to Treatment: Procedure was tolerated well Post Debridement Measurements of Total Wound Length: (cm) 0.6 Width: (cm) 0.3 Depth: (cm) 0.2 Volume: (cm) 0.028 Character of Wound/Ulcer Post Debridement: Improved Severity of Tissue Post Debridement: Fat layer exposed Post Procedure Diagnosis Same as Pre-procedure Electronic Signature(s) Signed: 04/25/2017 5:15:39 PM By: Montey Hora Signed: 04/26/2017 8:35:48 AM By: Lawanda Cousins Entered By: Montey Hora on 04/25/2017 10:11:20 [name], [name] ([id number]) -------------------------------------------------------------------------------- Debridement Details Patient Name: [name],  [name] Date of Service: 04/25/2017 9:15 AM Medical Record Number: [id number] Patient Account Number: [id number] Date of Birth/Sex: 10/14/1950 (67 y.o. Male) Treating RN: Montey Hora Primary Care Provider: Myrtie Hawk Other Clinician: Referring Provider: Myrtie Hawk Treating Provider/Extender: Cathie Olden in Treatment: 30 Debridement Performed for Wound #5 Left Toe Great Assessment: Performed By: Physician Lawanda Cousins, NP Debridement: Debridement Severity of Tissue Pre Fat layer exposed Debridement: Pre-procedure Verification/Time Yes - 10:11 Out Taken: Start Time: 10:11 Pain Control: Lidocaine 4% Topical Solution Level: Skin/Subcutaneous Tissue Total Area Debrided (L x W): 2 (cm) x 2.3 (cm) = 4.6 (cm) Tissue and other material Viable, Non-Viable, Fibrin/Slough, Subcutaneous debrided: Instrument: Curette Bleeding: Minimum Hemostasis Achieved: Pressure End Time: 10:12 Procedural Pain: 0 Post Procedural Pain: 0 Response to Treatment: Procedure was tolerated well Post Debridement Measurements of Total Wound Length: (cm) 2 Width: (cm) 2.3 Depth: (cm) 0.2 Volume: (cm) 0.723 Character of Wound/Ulcer Post Debridement: Improved Severity of Tissue Post Debridement: Fat layer exposed Post Procedure Diagnosis Same as Pre-procedure Electronic Signature(s) Signed: 04/25/2017 5:15:39 PM By: Montey Hora Signed: 04/26/2017 8:35:48 AM By: Lawanda Cousins Entered By: Montey Hora on 04/25/2017 10:11:58 [name], [name] ([id number]) -------------------------------------------------------------------------------- Debridement Details Patient Name: [name], [name] Date of Service: 04/25/2017 9:15 AM Medical Record Number: [id number] Patient Account Number: [id number] Date of Birth/Sex: [birth year] (67 y.o. Male) Treating RN: Montey Hora Primary Care Provider: Myrtie Hawk Other Clinician: Referring Provider: Myrtie Hawk Treating Provider/Extender: Cathie Olden in Treatment: 30 Debridement Performed for Wound #6 Left Toe Second Assessment: Performed By: Physician Lawanda Cousins, NP Debridement: Debridement Severity of Tissue Pre Fat layer exposed Debridement: Pre-procedure Verification/Time Yes - 10:12 Out Taken: Start Time: 10:12 Pain Control: Lidocaine 4% Topical Solution Level: Skin/Subcutaneous Tissue Total Area Debrided (L x W): 0.5 (cm) x 0.5 (cm) = 0.25 (cm) Tissue and other material Viable, Non-Viable, Fibrin/Slough, Subcutaneous debrided: Instrument: Curette Bleeding: Minimum Hemostasis Achieved: Pressure End Time: 10:13 Procedural Pain: 0 Post Procedural Pain: 0 Response to Treatment: Procedure was tolerated well Post Debridement Measurements of Total Wound Length: (cm) 0.5 Width: (cm) 0.5 Depth: (cm) 0.2 Volume: (cm) 0.039 Character of Wound/Ulcer Post Debridement: Improved Severity of Tissue Post Debridement: Fat layer exposed Post Procedure Diagnosis Same as Pre-procedure Electronic Signature(s) Signed: 04/25/2017 5:15:39 PM By: Montey Hora Signed: 04/26/2017 8:35:48 AM By: Lawanda Cousins Entered By: Montey Hora on 04/25/2017 10:12:36 Gangi, [name] ([id number]) -------------------------------------------------------------------------------- HPI Details Patient Name: [name], [name] Date of Service: 04/25/2017 9:15 AM Medical Record Number: [id number] Patient Account Number: [id number] Date of Birth/Sex: 20-Oct-1950 (67 y.o. Male) Treating RN: Montey Hora Primary Care Provider: Myrtie Hawk Other Clinician: Referring Provider: Myrtie Hawk Treating Provider/Extender: Cathie Olden in Treatment: 30 History of Present Illness Location:  bilateral feet ulceration on the toes Quality: Patient reports experiencing a dull pain to affected area(s). Severity: Patient states wound are getting better Duration: Patient has had the  wound for > 3 months prior to seeking treatment at the wound center Timing: Pain in wound is constant (hurts all the time) Context: The wound would happen gradually Modifying Factors: Other treatment(s) tried include:treatment for lymphedema and is seen by the podiatrist Dr. Caryl Comes Associated Signs and Symptoms: Patient reports having increase swelling. HPI Description: 67 year old patient here to see as for bilateral feet ulceration to on his left first and second toe and 2 on his right first and second toe, which she's had for about 4 months. 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 Donella Stade. The patient is also undergoing workup by medical oncology for a intra-abdominal lymphadenopathy. 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 He is also status post appendectomy, inguinal lymph node biopsy, rectal biopsy and rectal examination under anesthesia. he currently smokes cigarettes about half packet a day. In March of this year he was seen by Dr. Hortencia Pilar, for evaluation of bilateral varicose veins and besides wearing compression stockings he had recommended laser ablation of the right and left great saphenous veins to eleviate the symptoms and complications of severe superficial venous reflux disease. He also recommended lymphedema pumps for better control of his lymphedema. The patient recently has had on 08/23/2016, right greater saphenous vein ablation with the laser energy Earlier lower extremity venous reflux examination done on 05/08/2016 showed no DVT or SVT both lower legs but incompetence of bilateral great saphenous veins was present. A lower arterial study was also done and there was no significant right lower and left lower extremity  problems based on a normal toe brachial index bilaterally and the ABI was 1.21 the left and 1.23 on the right. His post ablation venous duplex examination showed successful ablation of the right GS vein with thrombus formation 2 below the right saphenofemoral junction. The deep system was patent without evidence of thrombosis and this was done on 08/30/2016. 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 Addendum: regarding his x-rays done today and x-ray of the left foot -- IMPRESSION: No objective evidence of osteomyelitis. There are soft tissue changes which may reflect cellulitis. X-ray of the right foot -- IMPRESSION:Findings compatible with cellulitis of the toes. No objective evidence of osteomyelitis is observed. 10/01/16 on evaluation today patient's wounds appeared to be doing some better. I did review the x-rays as well which showed no evidence of osteomyelitis although there was evidence on x-ray of cellulitis. He fortunately is not having any discomfort although he continues to have some swelling. He does not remember being on any antibiotics recently. 10/15/16 on evaluation today patient's wounds overall appear to be doing better although he does have a new location noted on the left foot. Fortunately he is not having significant pain. It almost has the appearance that something is rubbing on the end of his toes but he wears the open toe shoes and according to what he is telling me never wears anything that would rub on his foot. There is no evidence of infection and  specifically no evidence of a fungal infection 10/22/16 On evaluation today patient's wounds appeared to be doing better compared to last week in regard to his bilateral Mcgahee, Dong E. ([id number]) lower extremities. Fortunately I happy with how things are progressing although he still has ulcers I feel like that he  is improving and appropriate manner. 11/12/16 on evaluation today patient appears to be doing well in regard to his bilateral feet and the respective wounds. We have been using surrounding her dressings along with an antifungal cream which seems to be doing very well. He has no bilateral dysfunction noticed that the rituals are weight loss at this point. He also has no nausea or vomiting a note purulent discharge. He did see Vein and vascular today and he tells me that they told him he could have surgery for his venous stasis but they did not feel like it was worth it in his words. Fortunately patient's wounds do appear to be getting sneakily better. 11/26/2016 -- he says he is going to have some surgery during this week at [facility] for possibly a colon resection. 12/31/2016 -- the patient has been noncompliant with his smoking and I'm not sure whether he is also started drinking again. He continues to be very nonchalant about his care 01/14/2017 -- the patient's HandP has been reviewed well and I understand he is being compliant with trying to give up smoking and his local dressing changes. He does not have any surgical options of 4 to him by his vascular surgeons.he was last seen in early August by Dr. Hortencia Pilar who recommended compression stockings,and possibly lymph pumps in 2-3 months after doing a review ultrasound. 01/28/2017 - the patient did not have any fresh complaints but on examination I noted a large lacerated wound on the plantar aspect of his right fourth toe which had a lot of necrotic debris and it probes down to bone. 02/07/2017 -- x-ray of the right foot -- IMPRESSION: Soft tissue swelling about the first through fourth toes consistent with cellulitis. New destructive change in the tuft of the distal phalanx of the great toe is consistent with osteomyelitis. 02/14/2017 -- the patient's MRI is pending this coming Monday and he still continues to smoke. We have again gone  over off loading of his wounds in great detail and he says he's been compliant. 02/21/2017 -- MR of the right foot -- IMPRESSION: 1. Soft tissue ulcer at the tip of the first, second and third toe knows. Cortical irregularity and bone marrow edema in the first distal phalanx most concerning for osteomyelitis. Mild marrow edema in the second and third distal phalanx without definite cortical destruction which may reflect early osteomyelitis versus reactive marrow edema. 2. Soft tissue edema surrounding the first phalanx most consistent with cellulitis. the patient was also recently evaluated by his medical oncologist Dr. 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 She is treating him with weekly Procrit. She is also keeping intra-abdominal lymphadenopathy and right lower lobe lung nodule under observation. 04/04/2017 -- he was seen by Dr. Adrian Prows on 03/25/2017 -- after review he empirically put him on ciprofloxacin and doxycycline as they have good bone penetration and good bioavailability and it will cover the usual pathogens and diabetic foot osteomyelitis. He will check inflammatory markers and plan a 61-8 week old records. C-reactive protein was 0.3 and the ESR was 72 04/18/17 on evaluation today patient appears to be doing  about the same in regard to his lower extremity wounds bilaterally. He has continued to use the antifungal cream which does seem to be beneficial. Nonetheless the ulcers do seem to in some areas be epithelial eyes over and in other areas are still open. He is having no significant discomfort. 04/25/17-he is here in follow-up evaluation for multiple ulcerations to multiple toes bilaterally. He states he did see Dr. Ola Spurr again last week and continues antibiotic therapy. He is voicing no complaints or concerns, will continue with current treatment plan will possibility of adding compression  therapy next week after an additional week of treatment/lotions to BLE prescribed by Dr Ola Spurr Electronic Signature(s) Signed: 04/26/2017 8:35:48 AM By: Lawanda Cousins Entered By: Lawanda Cousins on 04/25/2017 10:19:19 [name], [name] ([id number]) -------------------------------------------------------------------------------- Physician Orders Details Patient Name: Zmuda, Tina E. Date of Service: 04/25/2017 9:15 AM Medical Record Number: [id number] Patient Account Number: [id number] Date of Birth/Sex: 08-Jun-1950 (67 y.o. Male) Treating RN: Montey Hora Primary Care Provider: Myrtie Hawk Other Clinician: Referring Provider: Myrtie Hawk Treating Provider/Extender: Cathie Olden in Treatment: 30 Verbal / Phone Orders: No Diagnosis Coding Wound Cleansing Wound #1 Right Toe Great o Clean wound with Normal Saline. o May Shower, gently pat wound dry prior to applying new dressing. Wound #2 Right Toe Second o Clean wound with Normal Saline. o May Shower, gently pat wound dry prior to applying new dressing. Wound #5 Left Toe Great o Clean wound with Normal Saline. o May Shower, gently pat wound dry prior to applying new dressing. Wound #6 Left Toe Second o Clean wound with Normal Saline. o May Shower, gently pat wound dry prior to applying new dressing. Wound #9 Right Toe Third o Clean wound with Normal Saline. o May Shower, gently pat wound dry prior to applying new dressing. Skin Barriers/Peri-Wound Care Wound #1 Right Toe Great o Antifungal cream Wound #2 Right Toe Second o Antifungal cream Wound #5 Left Toe Great o Antifungal cream Wound #6 Left Toe Second o Antifungal cream Wound #9 Right Toe Third o Antifungal cream Primary Wound Dressing Wound #1 Right Toe Great o Silvercel Non-Adherent o Other: - nystatin Wound #2 Right Toe Second o [place]cel Non-Adherent o Other: - nystatin Holton, Harland E.  ([id number]) Wound #5 Left Toe Great o Silvercel Non-Adherent o Other: - nystatin Wound #6 Left Toe Second o Silvercel Non-Adherent o Other: - nystatin Wound #9 Right Toe Third o Silvercel Non-Adherent o Other: - nystatin Secondary Dressing Wound #1 Right Toe Great o ABD and Kerlix/Conform Wound #2 Right Toe Second o ABD and Kerlix/Conform Wound #5 Left Toe Great o ABD and Kerlix/Conform Wound #6 Left Toe Second o ABD and Kerlix/Conform Wound #9 Right Toe Third o ABD and Kerlix/Conform Dressing Change Frequency Wound #1 Right Toe Great o Change dressing every day. Wound #2 Right Toe Second o Change dressing every day. Wound #5 Left Toe Great o Change dressing every day. Wound #6 Left Toe Second o Change dressing every day. Wound #9 Right Toe Third o Change dressing every day. Follow-up Appointments Wound #1 Right Toe Great o Return Appointment in 1 week. Wound #2 Right Toe Second o Return Appointment in 1 week. Wound #5 Left Toe Great o Return Appointment in 1 week. Wound #6 Left Toe Second Pfost, [name] ([id number]) o Return Appointment in 1 week. Wound #9 Right Toe Third o Return Appointment in 1 week. Additional Orders / Instructions Wound #1 Right Toe Great o Stop Smoking o Increase protein intake. o Other: -  Please add vitamin A, vitamin C and zinc supplements to your diet Wound #2 Right Toe Second o Stop Smoking o Increase protein intake. o Other: - Please add vitamin A, vitamin C and zinc supplements to your diet Wound #5 Left Toe Great o Stop Smoking o Increase protein intake. o Other: - Please add vitamin A, vitamin C and zinc supplements to your diet Wound #6 Left Toe Second o Stop Smoking o Increase protein intake. o Other: - Please add vitamin A, vitamin C and zinc supplements to your diet Wound #9 Right Toe Third o Stop Smoking o Increase protein intake. o Other: -  Please add vitamin A, vitamin C and zinc supplements to your diet Electronic Signature(s) Signed: 04/26/2017 8:35:48 AM By: Lawanda Cousins Entered By: Lawanda Cousins on 04/25/2017 10:20:02 [name], [name] ([id number]) -------------------------------------------------------------------------------- Problem List Details Patient Name: Amor, Trevontae E. Date of Service: 04/25/2017 9:15 AM Medical Record Number: [id number] Patient Account Number: [id number] Date of Birth/Sex: [birth year] (67 y.o. Male) Treating RN: Montey Hora Primary Care Provider: Myrtie Hawk Other Clinician: Referring Provider: Myrtie Hawk Treating Provider/Extender: Cathie Olden in Treatment: 30 Active Problems ICD-10 Encounter Code Description Active Date Diagnosis E11.621 Type 2 diabetes mellitus with foot ulcer 09/21/2016 Yes I87.313 Chronic venous hypertension (idiopathic) with ulcer of bilateral 09/21/2016 Yes lower extremity I89.0 Lymphedema, not elsewhere classified 09/21/2016 Yes L97.522 Non-pressure chronic ulcer of other part of left foot with fat layer 09/21/2016 Yes exposed L97.512 Non-pressure chronic ulcer of other part of right foot with fat layer 09/21/2016 Yes exposed F17.218 Nicotine dependence, cigarettes, with other nicotine-induced 09/21/2016 Yes disorders F10.19 Alcohol abuse with unspecified alcohol-induced disorder 09/21/2016 Yes M86.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, right ankle and foot 02/21/2017 Yes Inactive Problems Resolved Problems Electronic Signature(s) Signed: 04/26/2017 8:35:48 AM By: Lawanda Cousins Entered By: Lawanda Cousins on 04/25/2017 10:14:32 [place], [name] ([id number]) -------------------------------------------------------------------------------- Progress Note Details Patient Name: Frank, Recardo E. Date of Service: 04/25/2017 9:15 AM Medical Record Number: [id number] Patient Account Number: [id number] Date of Birth/Sex: Jun 11, 1950 (66 y.o.  Male) Treating RN: Montey Hora Primary Care Provider: Myrtie Hawk Other Clinician: Referring Provider: Myrtie Hawk Treating Provider/Extender: Cathie Olden in Treatment: 30 Subjective Chief Complaint Information obtained from Patient Patient presents for treatment of an open diabetic ulcer to both feet History of Present Illness (HPI) The following HPI elements were documented for the patient's wound: Location: bilateral feet ulceration on the toes Quality: Patient reports experiencing a dull pain to affected area(s). Severity: Patient states wound are getting better Duration: Patient has had the wound for > 3 months prior to seeking treatment at the wound center Timing: Pain in wound is constant (hurts all the time) Context: The wound would happen gradually Modifying Factors: Other treatment(s) tried include:treatment for lymphedema and is seen by the podiatrist Dr. Caryl Comes Associated Signs and Symptoms: Patient reports having increase swelling. 67 year old patient here to see as for bilateral feet ulceration to on his left first and second toe and 2 on his right first and second toe, which she's had for about 4 months. 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 Donella Stade. The patient is also undergoing workup by medical oncology for a intra-abdominal lymphadenopathy. 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 He is also status post appendectomy, inguinal lymph  node biopsy, rectal biopsy and rectal examination under anesthesia. he currently smokes cigarettes about half packet a day. In March of this year he was seen by Dr. Hortencia Pilar, for evaluation of bilateral varicose veins and besides wearing compression  stockings he had recommended laser ablation of the right and left great saphenous veins to eleviate the symptoms and complications of severe superficial venous reflux disease. He also recommended lymphedema pumps for better control of his lymphedema. The patient recently has had on 08/23/2016, right greater saphenous vein ablation with the laser energy Earlier lower extremity venous reflux examination done on 05/08/2016 showed no DVT or SVT both lower legs but incompetence of bilateral great saphenous veins was present. A lower arterial study was also done and there was no significant right lower and left lower extremity problems based on a normal toe brachial index bilaterally and the ABI was 1.21 the left and 1.23 on the right. His post ablation venous duplex examination showed successful ablation of the right GS vein with thrombus formation 2 below the right saphenofemoral junction. The deep system was patent without evidence of thrombosis and this was done on 08/30/2016. 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 Addendum: regarding his x-rays done today and x-ray of the left foot -- IMPRESSION: No objective evidence of osteomyelitis. There are soft tissue changes which may reflect cellulitis. X-ray of the right foot -- IMPRESSION:Findings compatible with cellulitis of the toes. No objective evidence of osteomyelitis is observed. 10/01/16 on evaluation today patient's wounds appeared to be doing some better. I did review the x-rays as well which showed no evidence of osteomyelitis although there was evidence on x-ray of cellulitis. He fortunately is not having any discomfort [name], [name] ([id number]) although he continues to have some swelling. He does not remember being on any antibiotics recently. 10/15/16 on evaluation today patient's wounds overall appear to be doing better  although he does have a new location noted on the left foot. Fortunately he is not having significant pain. It almost has the appearance that something is rubbing on the end of his toes but he wears the open toe shoes and according to what he is telling me never wears anything that would rub on his foot. There is no evidence of infection and specifically no evidence of a fungal infection 10/22/16 On evaluation today patient's wounds appeared to be doing better compared to last week in regard to his bilateral lower extremities. Fortunately I happy with how things are progressing although he still has ulcers I feel like that he is improving and appropriate manner. 11/12/16 on evaluation today patient appears to be doing well in regard to his bilateral feet and the respective wounds. We have been using surrounding her dressings along with an antifungal cream which seems to be doing very well. He has no bilateral dysfunction noticed that the rituals are weight loss at this point. He also has no nausea or vomiting a note purulent discharge. He did see Vein and vascular today and he tells me that they told him he could have surgery for his venous stasis but they did not feel like it was worth it in his words. Fortunately patient's wounds do appear to be getting sneakily better. 11/26/2016 -- he says he is going to have some surgery during this week at [facility] for possibly a colon resection. 12/31/2016 -- the patient has been  noncompliant with his smoking and I'm not sure whether he is also started drinking again. He continues to be very nonchalant about his care 01/14/2017 -- the patient's HandP has been reviewed well and I understand he is being compliant with trying to give up smoking and his local dressing changes. He does not have any surgical options of 4 to him by his vascular surgeons.he was last seen in early August by Dr. Hortencia Pilar who recommended compression stockings,and possibly lymph  pumps in 2-3 months after doing a review ultrasound. 01/28/2017 - the patient did not have any fresh complaints but on examination I noted a large lacerated wound on the plantar aspect of his right fourth toe which had a lot of necrotic debris and it probes down to bone. 02/07/2017 -- x-ray of the right foot -- IMPRESSION: Soft tissue swelling about the first through fourth toes consistent with cellulitis. New destructive change in the tuft of the distal phalanx of the great toe is consistent with osteomyelitis. 02/14/2017 -- the patient's MRI is pending this coming Monday and he still continues to smoke. We have again gone over off loading of his wounds in great detail and he says he's been compliant. 02/21/2017 -- MR of the right foot -- IMPRESSION: 1. Soft tissue ulcer at the tip of the first, second and third toe knows. Cortical irregularity and bone marrow edema in the first distal phalanx most concerning for osteomyelitis. Mild marrow edema in the second and third distal phalanx without definite cortical destruction which may reflect early osteomyelitis versus reactive marrow edema. 2. Soft tissue edema surrounding the first phalanx most consistent with cellulitis. the patient was also recently evaluated by his medical oncologist Dr. 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 She is treating him with weekly Procrit. She is also keeping intra-abdominal lymphadenopathy and right lower lobe lung nodule under observation. 04/04/2017 -- he was seen by Dr. Adrian Prows on 03/25/2017 -- after review he empirically put him on ciprofloxacin and doxycycline as they have good bone penetration and good bioavailability and it will cover the usual pathogens and diabetic foot osteomyelitis. He will check inflammatory markers and plan a 60-35 week old records. C-reactive protein was 0.3 and the ESR was 72 04/18/17 on evaluation today  patient appears to be doing about the same in regard to his lower extremity wounds bilaterally. He has continued to use the antifungal cream which does seem to be beneficial. Nonetheless the ulcers do seem to in some areas be epithelial eyes over and in other areas are still open. He is having no significant discomfort. 04/25/17-he is here in follow-up evaluation for multiple ulcerations to multiple toes bilaterally. He states he did see Dr. Ola Spurr again last week and continues antibiotic therapy. He is voicing no complaints or concerns, will continue with current treatment plan will possibility of adding compression therapy next week after an additional week of treatment/lotions to BLE prescribed by Dr Blanche East, [name] ([id number]) Patient History Information obtained from Patient. Social History Current every day smoker, Marital Status - Widowed, Alcohol Use - Daily - quit drinking about a week ago, Drug Use - No History, Caffeine Use - Moderate. Medical And Surgical History Notes Oncologic 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 Objective Constitutional Vitals Time Taken: 9:55 AM, Height: 69 in, Weight: 168 lbs, BMI: 24.8, Temperature: 98.1 F, Pulse: 72 bpm, Respiratory Rate: 16 breaths/min, Blood Pressure: 161/76  mmHg. Integumentary (Hair, Skin) Wound #1 status is Open. Original cause of wound was Gradually Appeared. The wound is located on the Right Toe Great. The wound measures 1.5cm length x 6.4cm width x 0.1cm depth; 7.54cm^2 area and 0.754cm^3 volume. There is Fat Layer (Subcutaneous Tissue) Exposed exposed. There is no tunneling or undermining noted. There is a large amount of serous drainage noted. Foul odor after cleansing was noted. The wound margin is flat and intact. There is medium (34-66%) red granulation within the wound bed. There is a small (1-33%) amount of necrotic tissue within the wound bed including Adherent Slough. The periwound  skin appearance exhibited: Excoriation, Maceration. The periwound skin appearance did not exhibit: Callus, Crepitus, Induration, Rash, Scarring, Dry/Scaly, Atrophie Blanche, Cyanosis, Ecchymosis, Hemosiderin Staining, Mottled, Pallor, Rubor, Erythema. Periwound temperature was noted as No Abnormality. Wound #2 status is Open. Original cause of wound was Gradually Appeared. The wound is located on the Right Toe Second. The wound measures 0.3cm length x 0.5cm width x 0.1cm depth; 0.118cm^2 area and 0.012cm^3 volume. There is Fat Layer (Subcutaneous Tissue) Exposed exposed. There is no tunneling or undermining noted. There is a large amount of serous drainage noted. Foul odor after cleansing was noted. The wound margin is flat and intact. There is medium (34-66%) red granulation within the wound bed. There is a small (1-33%) amount of necrotic tissue within the wound bed including Adherent Slough. The periwound skin appearance exhibited: Maceration. The periwound skin appearance did not exhibit: Callus, Crepitus, Excoriation, Induration, Rash, Scarring, Dry/Scaly, Atrophie Blanche, Cyanosis, Ecchymosis, Hemosiderin Staining, Mottled, Pallor, Rubor, Erythema. Periwound temperature was noted as No Abnormality. Wound #5 status is Open. Original cause of wound was Gradually Appeared. The wound is located on the Left Toe Great. The wound measures 2cm length x 2.3cm width x 0.1cm depth; 3.613cm^2 area and 0.361cm^3 volume. There is Fat Layer (Subcutaneous Tissue) Exposed exposed. There is no tunneling or undermining noted. There is a large amount of serous drainage noted. Foul odor after cleansing was noted. The wound margin is flat and intact. There is medium (34-66%) red granulation within the wound bed. There is a small (1-33%) amount of necrotic tissue within the wound bed including Adherent Slough. The periwound skin appearance exhibited: Maceration. The periwound skin appearance did not  exhibit: Callus, Crepitus, Excoriation, Induration, Rash, Scarring, Dry/Scaly, Atrophie Blanche, Cyanosis, Ecchymosis, Hemosiderin Staining, Mottled, Pallor, Rubor, Erythema. Periwound temperature was noted as No Abnormality. Wound #6 status is Open. Original cause of wound was Gradually Appeared. The wound is located on the Left Toe Second. The wound measures 0.5cm length x 0.5cm width x 0.1cm depth; 0.196cm^2 area and 0.02cm^3 volume. There is Fat Layer (Subcutaneous Tissue) Exposed exposed. There is no tunneling or undermining noted. There is a medium amount of serous drainage noted. Foul odor after cleansing was noted. The wound margin is flat and intact. There is medium (34-66%) red Marte, Truman E. ([id number]) granulation within the wound bed. There is a small (1-33%) amount of necrotic tissue within the wound bed including Adherent Slough. The periwound skin appearance exhibited: Maceration. The periwound skin appearance did not exhibit: Callus, Crepitus, Excoriation, Induration, Rash, Scarring, Dry/Scaly, Atrophie Blanche, Cyanosis, Ecchymosis, Hemosiderin Staining, Mottled, Pallor, Rubor, Erythema. Periwound temperature was noted as No Abnormality. Wound #9 status is Open. Original cause of wound was Gradually Appeared. The wound is located on the Right Toe Third. The wound measures 0.6cm length x 0.3cm width x 0.1cm depth; 0.141cm^2 area and 0.014cm^3 volume. There is Fat Layer (Subcutaneous  Tissue) Exposed exposed. There is no tunneling or undermining noted. There is a large amount of serous drainage noted. Foul odor after cleansing was noted. The wound margin is flat and intact. There is medium (34-66%) red granulation within the wound bed. There is a small (1-33%) amount of necrotic tissue within the wound bed including Adherent Slough. The periwound skin appearance exhibited: Maceration. The periwound skin appearance did not exhibit: Callus, Crepitus, Excoriation, Induration, Rash,  Scarring, Dry/Scaly, Atrophie Blanche, Cyanosis, Ecchymosis, Hemosiderin Staining, Mottled, Pallor, Rubor, Erythema. Periwound temperature was noted as No Abnormality. Assessment Active Problems ICD-10 E11.621 - Type 2 diabetes mellitus with foot ulcer I87.313 - Chronic venous hypertension (idiopathic) with ulcer of bilateral lower extremity I89.0 - Lymphedema, not elsewhere classified L97.522 - Non-pressure chronic ulcer of other part of left foot with fat layer exposed L97.512 - Non-pressure chronic ulcer of other part of right foot with fat layer exposed F17.218 - Nicotine dependence, cigarettes, with other nicotine-induced disorders F10.19 - Alcohol abuse 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 - Chronic multifocal osteomyelitis, right ankle and foot Procedures Wound #1 Pre-procedure diagnosis of Wound #1 is a Diabetic Wound/Ulcer of the Lower Extremity located on the Right Toe Great .Severity of Tissue Pre Debridement is: Fat layer exposed. There was a Skin/Subcutaneous Tissue Debridement ([id number]) debridement with total area of 7.5 sq cm performed by Lawanda Cousins, NP. with the following instrument(s): Curette to remove Viable and Non-Viable tissue/material including Fibrin/Slough and Subcutaneous after achieving pain control using Lidocaine 4% Topical Solution. A time out was conducted at 10:07, prior to the start of the procedure. A Minimum amount of bleeding was controlled with Pressure. The procedure was tolerated well with a pain level of 0 throughout and a pain level of 0 following the procedure. Post Debridement Measurements: 1.5cm length x 6.4cm width x 0.2cm depth; 1.508cm^3 volume. Character of Wound/Ulcer Post Debridement is improved. Severity of Tissue Post Debridement is: Fat layer exposed. Post procedure Diagnosis Wound #1: Same as Pre-Procedure Wound #2 Pre-procedure diagnosis of Wound #2 is a Diabetic Wound/Ulcer of the Lower Extremity located on the  Right Toe Second .Severity of Tissue Pre Debridement is: Fat layer exposed. There was a Skin/Subcutaneous Tissue Debridement ([id number]) debridement with total area of 0.15 sq cm performed by Lawanda Cousins, NP. with the following instrument(s): Curette to remove Viable and Non-Viable tissue/material including Fibrin/Slough and Subcutaneous after achieving pain control using Lidocaine 4% Topical Solution. A time out was conducted at 10:08, prior to the start of the procedure. A Minimum amount of bleeding was controlled with Pressure. The procedure was tolerated well with a pain level of 0 throughout [name], [name] ([id number]) and a pain level of 0 following the procedure. Post Debridement Measurements: 0.3cm length x 0.5cm width x 0.2cm depth; 0.024cm^3 volume. Character of Wound/Ulcer Post Debridement is improved. Severity of Tissue Post Debridement is: Fat layer exposed. Post procedure Diagnosis Wound #2: Same as Pre-Procedure Wound #5 Pre-procedure diagnosis of Wound #5 is a Diabetic Wound/Ulcer of the Lower Extremity located on the Left Toe Great .Severity of Tissue Pre Debridement is: Fat layer exposed. There was a Skin/Subcutaneous Tissue Debridement ([id number]) debridement with total area of 4.6 sq cm performed by Lawanda Cousins, NP. with the following instrument(s): Curette to remove Viable and Non-Viable tissue/material including Fibrin/Slough and Subcutaneous after achieving pain control using Lidocaine 4% Topical Solution. A time out was conducted at 10:11, prior to the start of the procedure. A Minimum amount of bleeding was controlled with Pressure. The procedure was tolerated  well with a pain level of 0 throughout and a pain level of 0 following the procedure. Post Debridement Measurements: 2cm length x 2.3cm width x 0.2cm depth; 0.723cm^3 volume. Character of Wound/Ulcer Post Debridement is improved. Severity of Tissue Post Debridement is: Fat layer exposed. Post procedure  Diagnosis Wound #5: Same as Pre-Procedure Wound #6 Pre-procedure diagnosis of Wound #6 is a Diabetic Wound/Ulcer of the Lower Extremity located on the Left Toe Second .Severity of Tissue Pre Debridement is: Fat layer exposed. There was a Skin/Subcutaneous Tissue Debridement ([id number]) debridement with total area of 0.25 sq cm performed by Lawanda Cousins, NP. with the following instrument(s): Curette to remove Viable and Non-Viable tissue/material including Fibrin/Slough and Subcutaneous after achieving pain control using Lidocaine 4% Topical Solution. A time out was conducted at 10:12, prior to the start of the procedure. A Minimum amount of bleeding was controlled with Pressure. The procedure was tolerated well with a pain level of 0 throughout and a pain level of 0 following the procedure. Post Debridement Measurements: 0.5cm length x 0.5cm width x 0.2cm depth; 0.039cm^3 volume. Character of Wound/Ulcer Post Debridement is improved. Severity of Tissue Post Debridement is: Fat layer exposed. Post procedure Diagnosis Wound #6: Same as Pre-Procedure Wound #9 Pre-procedure diagnosis of Wound #9 is a Diabetic Wound/Ulcer of the Lower Extremity located on the Right Toe Third .Severity of Tissue Pre Debridement is: Fat layer exposed. There was a Skin/Subcutaneous Tissue Debridement ([id number]) debridement with total area of 0.18 sq cm performed by Lawanda Cousins, NP. with the following instrument(s): Curette to remove Viable and Non-Viable tissue/material including Fibrin/Slough and Subcutaneous after achieving pain control using Lidocaine 4% Topical Solution. A time out was conducted at 10:09, prior to the start of the procedure. A Minimum amount of bleeding was controlled with Pressure. The procedure was tolerated well with a pain level of 0 throughout and a pain level of 0 following the procedure. Post Debridement Measurements: 0.6cm length x 0.3cm width x 0.2cm depth; 0.028cm^3  volume. Character of Wound/Ulcer Post Debridement is improved. Severity of Tissue Post Debridement is: Fat layer exposed. Post procedure Diagnosis Wound #9: Same as Pre-Procedure Plan Wound Cleansing: Wound #1 Right Toe Great: Clean wound with Normal Saline. May Shower, gently pat wound dry prior to applying new dressing. Wound #2 Right Toe Second: Clean wound with Normal Saline. May Shower, gently pat wound dry prior to applying new dressing. Wound #5 Left Toe Great: [name], [name] E. ([id number]) Clean wound with Normal Saline. May Shower, gently pat wound dry prior to applying new dressing. Wound #6 Left Toe Second: Clean wound with Normal Saline. May Shower, gently pat wound dry prior to applying new dressing. Wound #9 Right Toe Third: Clean wound with Normal Saline. May Shower, gently pat wound dry prior to applying new dressing. Skin Barriers/Peri-Wound Care: Wound #1 Right Toe Great: Antifungal cream Wound #2 Right Toe Second: Antifungal cream Wound #5 Left Toe Great: Antifungal cream Wound #6 Left Toe Second: Antifungal cream Wound #9 Right Toe Third: Antifungal cream Primary Wound Dressing: Wound #1 Right Toe Great: Silvercel Non-Adherent Other: - nystatin Wound #2 Right Toe Second: Silvercel Non-Adherent Other: - nystatin Wound #5 Left Toe Great: Silvercel Non-Adherent Other: - nystatin Wound #6 Left Toe Second: Silvercel Non-Adherent Other: - nystatin Wound #9 Right Toe Third: Silvercel Non-Adherent Other: - nystatin Secondary Dressing: Wound #1 Right Toe Great: ABD and Kerlix/Conform Wound #2 Right Toe Second: ABD and Kerlix/Conform Wound #5 Left Toe Great: ABD and Kerlix/Conform Wound #6 Left Toe Second:  ABD and Kerlix/Conform Wound #9 Right Toe Third: ABD and Kerlix/Conform Dressing Change Frequency: Wound #1 Right Toe Great: Change dressing every day. Wound #2 Right Toe Second: Change dressing every day. Wound #5 Left Toe Great: Change  dressing every day. Wound #6 Left Toe Second: Change dressing every day. Wound #9 Right Toe Third: Change dressing every day. Follow-up Appointments: Wound #1 Right Toe Great: Return Appointment in 1 week. TIMOTHEY, DAHLSTROM ([id number]) Wound #2 Right Toe Second: Return Appointment in 1 week. Wound #5 Left Toe Great: Return Appointment in 1 week. Wound #6 Left Toe Second: Return Appointment in 1 week. Wound #9 Right Toe Third: Return Appointment in 1 week. Additional Orders / Instructions: Wound #1 Right Toe Great: Stop Smoking Increase protein intake. Other: - Please add vitamin A, vitamin C and zinc supplements to your diet Wound #2 Right Toe Second: Stop Smoking Increase protein intake. Other: - Please add vitamin A, vitamin C and zinc supplements to your diet Wound #5 Left Toe Great: Stop Smoking Increase protein intake. Other: - Please add vitamin A, vitamin C and zinc supplements to your diet Wound #6 Left Toe Second: Stop Smoking Increase protein intake. Other: - Please add vitamin A, vitamin C and zinc supplements to your diet Wound #9 Right Toe Third: Stop Smoking Increase protein intake. Other: - Please add vitamin A, vitamin C and zinc supplements to your diet 1. Continue with current treatment plan per [place] and ID 2. Follow-up next week Electronic Signature(s) Signed: 04/26/2017 8:35:48 AM By: Lawanda Cousins Entered By: Lawanda Cousins on 04/25/2017 16:03:59 Wurzel, [name] ([id number]) -------------------------------------------------------------------------------- ROS/PFSH Details Patient Name: Musick, Oluwatosin E. Date of Service: 04/25/2017 9:15 AM Medical Record Number: [id number] Patient Account Number: [id number] Date of Birth/Sex: 03/22/1951 (67 y.o. Male) Treating RN: Montey Hora Primary Care Provider: Myrtie Hawk Other Clinician: Referring Provider: Myrtie Hawk Treating Provider/Extender: Cathie Olden in Treatment:  30 Information Obtained From Patient Wound History Do you currently have one or more open woundso Yes How many open wounds do you currently haveo 6 Approximately how long have you had your woundso 3 months How have you been treating your wound(s) until nowo ointment and bandage Has your wound(s) ever healed and then re-openedo No Have you had any lab work done in the past montho No Have you tested positive for an antibiotic resistant organism (MRSA, VRE)o No Have you tested positive for osteomyelitis (bone infection)o No Have you had any tests for circulation on your legso Yes Who ordered the testo PCP Where was the test doneo [facility] Medical History: Negative for: Cataracts; Glaucoma; Optic Neuritis Ear/Nose/Mouth/Throat Medical History: Negative for: Chronic sinus problems/congestion; Middle ear problems Hematologic/Lymphatic Medical History: Positive for: Anemia; Lymphedema Negative for: Hemophilia; Human Immunodeficiency Virus; Sickle Cell Disease Respiratory Medical History: Negative for: Aspiration; Asthma; Chronic Obstructive Pulmonary Disease (COPD); Pneumothorax; Sleep Apnea; Tuberculosis Cardiovascular Medical History: Positive for: Congestive Heart Failure; Hypertension; Peripheral Venous Disease Negative for: Angina; Arrhythmia; Coronary Artery Disease; Deep Vein Thrombosis; Hypotension; Myocardial Infarction; Peripheral Arterial Disease; Phlebitis; Vasculitis Gastrointestinal Medical History: Negative for: Cirrhosis ; Colitis; Crohnos; Hepatitis A; Hepatitis B; Hepatitis C Lasser, Schon E. ([id number]) Endocrine Medical History: Positive for: Type II Diabetes Treated with: Oral agents Blood sugar tested every day: Yes Tested : QD Genitourinary Medical History: Negative for: End Stage Renal Disease Immunological Medical History: Negative for: Lupus Erythematosus; Raynaudos; Scleroderma Integumentary (Skin) Medical History: Negative for: History of Burn;  History of pressure wounds Musculoskeletal Medical History: Negative for: Gout; Rheumatoid Arthritis;  Osteoarthritis; Osteomyelitis Neurologic Medical History: Positive for: Neuropathy Negative for: Dementia; Quadriplegia; Paraplegia; Seizure Disorder Oncologic Medical History: Past Medical History Notes: 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 Electronic Signature(s) Signed: 04/25/2017 5:15:39 PM By: Montey Hora Signed: 04/26/2017 8:35:48 AM By: Stanton Kidney ([id number]) Entered By: Lawanda Cousins on 04/25/2017 10:19:46 Currey, [name] ([id number]) -------------------------------------------------------------------------------- SuperBill Details Patient Name: Kang, Montae E. Date of Service: 04/25/2017 Medical Record Number: [id number] Patient Account Number: [id number] Date of Birth/Sex: Aug 02, 1950 (67 y.o. 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 F17.218 Nicotine dependence, cigarettes, 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 abuse 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, right ankle and foot Facility Procedures CPT4 Code: [id number] Description: 70340 - DEB SUBQ TISSUE 20 SQ CM/< ICD-10 Diagnosis Description E11.621 Type 2 diabetes mellitus with foot ulcer L97.522 Non-pressure chronic ulcer of other part of left foot with fat L97.512 Non-pressure chronic ulcer of other part of right  foot with fat Modifier: layer exposed layer exposed Quantity: 1 Physician Procedures CPT4 Code: [id number] Description: 59093 - WC PHYS SUBQ TISS 20 SQ CM ICD-10 Diagnosis Description E11.621 Type 2 diabetes mellitus with foot ulcer L97.522 Non-pressure chronic ulcer of other part of left foot with fat L97.512 Non-pressure chronic ulcer of other part of right  foot with fat Modifier: layer exposed layer exposed Quantity: 1 Electronic Signature(s) Signed: 04/26/2017 8:35:48 AM By: Lawanda Cousins Entered By: Lawanda Cousins on 04/25/2017 10:21:11

## 2017-04-27 NOTE — Progress Notes (Signed)
MAXON, KRESSE ([id number]) Visit Report for 04/25/2017 Arrival Information Details Patient Name: [name], [name] Date of Service: 04/25/2017 9:15 AM Medical Record Number: [id number] Patient Account Number: [id number] Date of Birth/Sex: 1950-06-02 (67 y.o. Male) Treating RN: Montey Hora Primary Care Eldean Klatt: Myrtie Hawk Other Clinician: Referring Ranen Doolin: Myrtie Hawk Treating Bawi Lakins/Extender: Cathie Olden in Treatment: 38 Visit Information History Since Last Visit Added or deleted any medications: No Patient Arrived: Ambulatory Any new allergies or adverse reactions: No Arrival Time: 09:46 Had a fall or experienced change in No Accompanied By: brother activities of daily living that may affect Transfer Assistance: None risk of falls: Patient Identification Verified: Yes Signs or symptoms of abuse/neglect since last visito No Secondary Verification Process Completed: Yes Hospitalized since last visit: No Patient Requires Transmission-Based No Has Dressing in Place as Prescribed: Yes Precautions: Pain Present Now: No Patient Has Alerts: Yes Patient Alerts: DMII Electronic Signature(s) Signed: 04/25/2017 5:15:39 PM By: Montey Hora Entered By: Montey Hora on 04/25/2017 09:46:45 [name], [name] ([id number]) -------------------------------------------------------------------------------- Encounter Discharge Information Details Patient Name: [name], [name] Date of Service: 04/25/2017 9:15 AM Medical Record Number: [id number] Patient Account Number: [id number] Date of Birth/Sex: 01/21/1951 (67 y.o. Male) Treating RN: Montey Hora Primary Care Rollyn Scialdone: Myrtie Hawk Other Clinician: Referring Nathalia Wismer: Myrtie Hawk Treating Shadana Pry/Extender: Cathie Olden in Treatment: 30 Encounter Discharge Information Items Discharge Pain Level: 0 Discharge Condition: Stable Ambulatory Status: Ambulatory Discharge Destination:  Home Transportation: Private Auto Accompanied By: brother Schedule Follow-up Appointment: Yes Medication Reconciliation completed and No provided to Patient/Care Kelby Lotspeich: Provided on Clinical Summary of Care: 04/25/2017 Form Type Recipient Paper Patient ED Electronic Signature(s) Signed: 04/25/2017 11:04:47 AM By: Montey Hora Entered By: Montey Hora on 04/25/2017 11:04:46 [name], [name] -------------------------------------------------------------------------------- Lower Extremity Assessment Details Patient Name: [name], [name] Date of Service: 04/25/2017 9:15 AM Medical Record Number: [id number] Patient Account Number: [id number] Date of Birth/Sex: [birth year] (67 y.o. Male) Treating RN: Montey Hora Primary Care Frances Joynt: Myrtie Hawk Other Clinician: Referring Jentry Warnell: Myrtie Hawk Treating Sahithi Ordoyne/Extender: Cathie Olden in Treatment: 30 Vascular Assessment Pulses: Dorsalis Pedis Palpable: [Left:Yes] [Right:Yes] Posterior Tibial Extremity colors, hair growth, and conditions: Extremity Color: [Left:Hyperpigmented] [Right:Hyperpigmented] Hair Growth on Extremity: [Left:No] [Right:No] Temperature of Extremity: [Left:Warm] [Right:Warm] Capillary Refill: [Left:< 3 seconds] [Right:< 3 seconds] Electronic Signature(s) Signed: 04/25/2017 5:15:39 PM By: Montey Hora Entered By: Montey Hora on 04/25/2017 09:55:38 [name], [name] -------------------------------------------------------------------------------- Multi Wound Chart Details Patient Name: [name], [name] Date of Service: 04/25/2017 9:15 AM Medical Record Number: [id number] Patient Account Number: [id number] Date of Birth/Sex: 12-30-1950 (68 y.o. Male) Treating RN: Montey Hora Primary Care Quierra Silverio: Myrtie Hawk Other Clinician: Referring Henery Betzold: Myrtie Hawk Treating Kileen Lange/Extender: Cathie Olden in Treatment: 30 Vital  Signs Height(in): 69 Pulse(bpm): 72 Weight(lbs): 168 Blood Pressure(mmHg): 161/76 Body Mass Index(BMI): 25 Temperature(F): 98.1 Respiratory Rate 16 (breaths/min): Photos: [1:No Photos] [2:No Photos] [5:No Photos] Wound Location: [1:Right Toe Great] [2:Right Toe Second] [5:Left Toe Great] Wounding Event: [1:Gradually Appeared] [2:Gradually Appeared] [5:Gradually Appeared] Primary Etiology: [1:Diabetic Wound/Ulcer of the Lower Extremity] [2:Diabetic Wound/Ulcer of the Lower Extremity] [5:Diabetic Wound/Ulcer of the Lower Extremity] Date Acquired: [1:06/11/2016] [2:06/11/2016] [5:06/11/2016] Weeks of Treatment: [1:30] [2:30] [5:30] Wound Status: [1:Open] [2:Open] [5:Open] Pending Amputation on [1:Yes] [2:Yes] [5:Yes] Presentation: Measurements L x W x D [1:1.5x6.4x0.1] [2:0.3x0.5x0.1] [5:2x2.3x0.1] (cm) Area (cm) : [1:7.54] [2:0.118] [5:3.613] Volume (cm) : [1:0.754] [2:0.012] [5:0.361] % Reduction in Area: [1:84.00%] [2:96.20%] [5:74.70%] % Reduction in Volume: [1:84.00%] [2:96.10%] [5:74.70%] Classification: [1:Grade 1] [2:Grade 1] [5:Grade 1]  Debridement: [1:Debridement ([id number])] [2:Debridement ([id number])] [5:Debridement ([id number])] Pre-procedure [1:10:07] [2:10:08] [5:10:11] Verification/Time Out Taken: Pain Control: [1:Lidocaine 4% Topical Solution] [2:Lidocaine 4% Topical Solution] [5:Lidocaine 4% Topical Solution] Tissue Debrided: [1:Fibrin/Slough, Subcutaneous] [2:Fibrin/Slough, Subcutaneous] [5:Fibrin/Slough, Subcutaneous] Level: [1:Skin/Subcutaneous Tissue] [2:Skin/Subcutaneous Tissue] [5:Skin/Subcutaneous Tissue] Debridement Area (sq cm): [1:7.5] [2:0.15] [5:4.6] Instrument: [1:Curette] [2:Curette] [5:Curette] Bleeding: [1:Minimum] [2:Minimum] [5:Minimum] Hemostasis Achieved: [1:Pressure] [2:Pressure] [5:Pressure] Procedural Pain: [1:0] [2:0] [5:0] Post Procedural Pain: [1:0] [2:0] [5:0] Debridement Treatment [1:Procedure was tolerated well] [2:Procedure was  tolerated well] [5:Procedure was tolerated well] Response: Post Debridement [1:1.5x6.4x0.2] [2:0.3x0.5x0.2] [5:2x2.3x0.2] Measurements L x W x D (cm) Post Debridement Volume: [1:1.508] [2:0.024] [5:0.723] (cm) Periwound Skin Texture: [1:No Abnormalities Noted] [2:No Abnormalities Noted] [5:No Abnormalities Noted] Periwound Skin Moisture: No Abnormalities Noted No Abnormalities Noted No Abnormalities Noted Periwound Skin Color: No Abnormalities Noted No Abnormalities Noted No Abnormalities Noted Tenderness on Palpation: No No No Procedures Performed: Debridement Debridement Debridement Wound Number: 6 [id number] N/A Photos: No Photos No Photos N/A Wound Location: Left Toe Second Right Toe Third N/A Wounding Event: Gradually Appeared Gradually Appeared N/A Primary Etiology: Diabetic Wound/Ulcer of the Diabetic Wound/Ulcer of the N/A Lower Extremity Lower Extremity Date Acquired: 06/11/2016 12/31/2016 N/A Weeks of Treatment: 30 16 N/A Wound Status: Open Open N/A Pending Amputation on Yes No N/A Presentation: Measurements L x W x D 0.5x0.5x0.1 0.6x0.3x0.1 N/A (cm) Area (cm) : 0.196 0.141 N/A Volume (cm) : 0.02 0.014 N/A % Reduction in Area: 90.80% 64.10% N/A % Reduction in Volume: 90.70% 64.10% N/A Classification: Grade 1 Grade 1 N/A Debridement: Debridement ([id number]) Debridement ([id number]) N/A Pre-procedure 10:12 10:09 N/A Verification/Time Out Taken: Pain Control: Lidocaine 4% Topical Solution Lidocaine 4% Topical Solution N/A Tissue Debrided: Fibrin/Slough, Subcutaneous Fibrin/Slough, Subcutaneous N/A Level: Skin/Subcutaneous Tissue Skin/Subcutaneous Tissue N/A Debridement Area (sq cm): 0.25 0.18 N/A Instrument: Curette Curette N/A Bleeding: Minimum Minimum N/A Hemostasis Achieved: Pressure Pressure N/A Procedural Pain: 0 0 N/A Post Procedural Pain: 0 0 N/A Debridement Treatment Procedure was tolerated well Procedure was tolerated well N/A Response: Post Debridement  0.5x0.5x0.2 0.6x0.3x0.2 N/A Measurements L x W x D (cm) Post Debridement Volume: 0.039 0.028 N/A (cm) Periwound Skin Texture: No Abnormalities Noted No Abnormalities Noted N/A Periwound Skin Moisture: No Abnormalities Noted No Abnormalities Noted N/A Periwound Skin Color: No Abnormalities Noted No Abnormalities Noted N/A Tenderness on Palpation: No No N/A Procedures Performed: Debridement Debridement N/A Treatment Notes Electronic Signature(s) Signed: 04/26/2017 8:35:48 AM By: Lawanda Cousins Entered By: Lawanda Cousins on 04/25/2017 10:14:39 [name], [name] ([id number]) -------------------------------------------------------------------------------- [place] Details Patient Name: [name] Brittle E. Date of Service: 04/25/2017 9:15 AM Medical Record Number: [id number] Patient Account Number: [id number] Date of Birth/Sex: 04/08/1951 (67 y.o. Male) Treating RN: Montey Hora Primary Care Moriya Mitchell: Myrtie Hawk Other Clinician: Referring Francois Elk: Myrtie Hawk Treating Thunder Bridgewater/Extender: Cathie Olden in Treatment: 30 Active Inactive ` Abuse / Safety / Falls / Self Care Management Nursing Diagnoses: Potential for falls Goals: Patient will remain injury free related to falls Date Initiated: 09/21/2016 Target Resolution Date: 11/30/2016 Goal Status: Active Interventions: Assess fall risk on admission and as needed Notes: ` Nutrition Nursing Diagnoses: Potential for alteratiion in Nutrition/Potential for imbalanced nutrition Goals: Patient/caregiver agrees to and verbalizes understanding of need to use nutritional supplements and/or vitamins as prescribed Date Initiated: 09/21/2016 Target Resolution Date: 11/30/2016 Goal Status: Active Interventions: Assess patient nutrition upon admission and as needed per policy Notes: ` Orientation to the Wound Care Program Nursing Diagnoses: Knowledge deficit related to the wound healing center  program Goals: Patient/caregiver will verbalize  understanding of the [facility] Date Initiated: 09/21/2016 Target Resolution Date: 11/30/2016 Goal Status: Active Interventions: [place], AMORY SIMONETTI ([id number]) Provide education on orientation to the wound center Notes: ` Wound/Skin Impairment Nursing Diagnoses: Knowledge deficit related to smoking impact on wound healing Goals: Ulcer/skin breakdown will have a volume reduction of 30% by week 4 Date Initiated: 09/21/2016 Target Resolution Date: 11/30/2016 Goal Status: Active Ulcer/skin breakdown will have a volume reduction of 50% by week 8 Date Initiated: 09/21/2016 Target Resolution Date: 11/30/2016 Goal Status: Active Ulcer/skin breakdown will have a volume reduction of 80% by week 12 Date Initiated: 09/21/2016 Target Resolution Date: 11/30/2016 Goal Status: Active Ulcer/skin breakdown will heal within 14 weeks Date Initiated: 09/21/2016 Target Resolution Date: 11/30/2016 Goal Status: Active Interventions: Assess patient/caregiver ability to obtain necessary supplies Assess patient/caregiver ability to perform ulcer/skin care regimen upon admission and as needed Assess ulceration(s) every visit Notes: Electronic Signature(s) Signed: 04/25/2017 5:15:39 PM By: Montey Hora Entered By: Montey Hora on 04/25/2017 10:06:40 Bellisario, [name] ([id number]) -------------------------------------------------------------------------------- Pain Assessment Details Patient Name: Tesar, Shann E. Date of Service: 04/25/2017 9:15 AM Medical Record Number: [id number] Patient Account Number: [id number] Date of Birth/Sex: 1950/10/27 (67 y.o. Male) Treating RN: Montey Hora Primary Care Dylan Monforte: Myrtie Hawk Other Clinician: Referring Janari Yamada: Myrtie Hawk Treating Janyla Biscoe/Extender: Cathie Olden in Treatment: 30 Active Problems Location of Pain Severity and Description of Pain Patient Has Paino  Yes Site Locations Pain Location: Pain in Ulcers Pain Management and Medication Current Pain Management: Notes Topical or injectable lidocaine is offered to patient for acute pain when surgical debridement is performed. If needed, Patient is instructed to use over the counter pain medication for the following 24-48 hours after debridement. Wound care MDs do not prescribed pain medications. Patient has chronic pain or uncontrolled pain. Patient has been instructed to make an appointment with their Primary Care Physician for pain management. Electronic Signature(s) Signed: 04/25/2017 5:15:39 PM By: Montey Hora Entered By: Montey Hora on 04/25/2017 09:47:00 Masaki, [name] ([id number]) -------------------------------------------------------------------------------- Patient/Caregiver Education Details Patient Name: [name] Brittle E. Date of Service: 04/25/2017 9:15 AM Medical Record Number: [id number] Patient Account Number: [id number] Date of Birth/Gender: 1950-10-17 (67 y.o. Male) Treating RN: Montey Hora Primary Care Physician: Myrtie Hawk Other Clinician: Referring Physician: Myrtie Hawk Treating Physician/Extender: Cathie Olden in Treatment: 30 Education Assessment Education Provided To: Patient Education Topics Provided Wound/Skin Impairment: Handouts: Other: wound care to continue as ordered Methods: Demonstration, Explain/Verbal Responses: State content correctly Electronic Signature(s) Signed: 04/25/2017 5:15:39 PM By: Montey Hora Entered By: Montey Hora on 04/25/2017 11:05:15 [name], [name] ([id number]) -------------------------------------------------------------------------------- Wound Assessment Details Patient Name: [name], [name] Date of Service: 04/25/2017 9:15 AM Medical Record Number: [id number] Patient Account Number: [id number] Date of Birth/Sex: [birth year] (67 y.o. Male) Treating RN: Montey Hora Primary Care  Takara Sermons: Myrtie Hawk Other Clinician: Referring Felipe Paluch: Myrtie Hawk Treating Avonlea Sima/Extender: Cathie Olden in Treatment: 30 Wound Status Wound Number: 1 Primary Diabetic Wound/Ulcer of the Lower Extremity Etiology: Wound Location: Right Toe Great Wound Open Wounding Event: Gradually Appeared Status: Date Acquired: 06/11/2016 Comorbid Anemia, Lymphedema, Congestive Heart Weeks Of Treatment: 30 History: Failure, Hypertension, Peripheral Venous Clustered Wound: No Disease, Type II Diabetes, Neuropathy Pending Amputation On Presentation Photos Photo Uploaded By: Montey Hora on 04/25/2017 10:56:20 Wound Measurements Length: (cm) 1.5 Width: (cm) 6.4 Depth: (cm) 0.1 Area: (cm) 7.54 Volume: (cm) 0.754 % Reduction in Area: 84% % Reduction in Volume: 84% Epithelialization: None Tunneling: No Undermining: No Wound Description Classification: Grade 2 Wound Margin:  Flat and Intact Exudate Amount: Large Exudate Type: Serous Exudate Color: amber Foul Odor After Cleansing: Yes Due to Product Use: No Slough/Fibrino No Wound Bed Granulation Amount: Medium (34-66%) Exposed Structure Granulation Quality: Red Fascia Exposed: No Necrotic Amount: Small (1-33%) Fat Layer (Subcutaneous Tissue) Exposed: Yes Necrotic Quality: Adherent Slough Tendon Exposed: No Muscle Exposed: No Joint Exposed: No Bone Exposed: No Periwound Skin Texture Florence, Field E. ([id number]) Texture Color No Abnormalities Noted: No No Abnormalities Noted: No Callus: No Atrophie Blanche: No Crepitus: No Cyanosis: No Excoriation: Yes Ecchymosis: No Induration: No Erythema: No Rash: No Hemosiderin Staining: No Scarring: No Mottled: No Pallor: No Moisture Rubor: No No Abnormalities Noted: No Dry / Scaly: No Temperature / Pain Maceration: Yes Temperature: No Abnormality Wound Preparation Ulcer Cleansing: Rinsed/Irrigated with Saline Topical Anesthetic  Applied: Other: lidocaine 4%, Treatment Notes Wound #1 (Right Toe Great) 1. Cleansed with: Clean wound with Normal Saline 2. Anesthetic Topical Lidocaine 4% cream to wound bed prior to debridement 3. Peri-wound Care: Antifungal cream 4. Dressing Applied: Other dressing (specify in notes) 5. Secondary Dressing Applied Dry Gauze Kerlix/Conform 7. Secured with Tape Notes silvercel, netting Electronic Signature(s) Signed: 04/25/2017 10:51:07 AM By: Montey Hora Entered By: Montey Hora on 04/25/2017 10:51:06 Baglio, [name] ([id number]) -------------------------------------------------------------------------------- Wound Assessment Details Patient Name: [name], [name] Date of Service: 04/25/2017 9:15 AM Medical Record Number: [id number] Patient Account Number: [id number] Date of Birth/Sex: 03/22/1951 (67 y.o. Male) Treating RN: Montey Hora Primary Care Naidelin Gugliotta: Myrtie Hawk Other Clinician: Referring Lurline Caver: Myrtie Hawk Treating Ande Therrell/Extender: Cathie Olden in Treatment: 30 Wound Status Wound Number: 2 Primary Diabetic Wound/Ulcer of the Lower Extremity Etiology: Wound Location: Right Toe Second Wound Open Wounding Event: Gradually Appeared Status: Date Acquired: 06/11/2016 Comorbid Anemia, Lymphedema, Congestive Heart Weeks Of Treatment: 30 History: Failure, Hypertension, Peripheral Venous Clustered Wound: No Disease, Type II Diabetes, Neuropathy Pending Amputation On Presentation Photos Photo Uploaded By: Montey Hora on 04/25/2017 10:55:54 Wound Measurements Length: (cm) 0.3 Width: (cm) 0.5 Depth: (cm) 0.1 Area: (cm) 0.118 Volume: (cm) 0.012 % Reduction in Area: 96.2% % Reduction in Volume: 96.1% Epithelialization: None Tunneling: No Undermining: No Wound Description Classification: Grade 2 Wound Margin: Flat and Intact Exudate Amount: Large Exudate Type: Serous Exudate Color: amber Foul Odor After Cleansing:  Yes Due to Product Use: No Slough/Fibrino No Wound Bed Granulation Amount: Medium (34-66%) Exposed Structure Granulation Quality: Red Fascia Exposed: No Necrotic Amount: Small (1-33%) Fat Layer (Subcutaneous Tissue) Exposed: Yes Necrotic Quality: Adherent Slough Tendon Exposed: No Muscle Exposed: No Joint Exposed: No Bone Exposed: No Periwound Skin Texture Vasko, Tayvien E. ([id number]) Texture Color No Abnormalities Noted: No No Abnormalities Noted: No Callus: No Atrophie Blanche: No Crepitus: No Cyanosis: No Excoriation: No Ecchymosis: No Induration: No Erythema: No Rash: No Hemosiderin Staining: No Scarring: No Mottled: No Pallor: No Moisture Rubor: No No Abnormalities Noted: No Dry / Scaly: No Temperature / Pain Maceration: Yes Temperature: No Abnormality Wound Preparation Ulcer Cleansing: Rinsed/Irrigated with Saline Topical Anesthetic Applied: Other: lidocaine 4%, Treatment Notes Wound #2 (Right Toe Second) 1. Cleansed with: Clean wound with Normal Saline 2. Anesthetic Topical Lidocaine 4% cream to wound bed prior to debridement 3. Peri-wound Care: Antifungal cream 4. Dressing Applied: Other dressing (specify in notes) 5. Secondary Dressing Applied Dry Gauze Kerlix/Conform 7. Secured with Tape Notes silvercel, netting Electronic Signature(s) Signed: 04/25/2017 10:51:52 AM By: Montey Hora Entered By: Montey Hora on 04/25/2017 10:51:51 Fiallo, [name] ([id number]) -------------------------------------------------------------------------------- Wound Assessment Details Patient Name: [name], [name] Date of Service:  04/25/2017 9:15 AM Medical Record Number: [id number] Patient Account Number: [id number] Date of Birth/Sex: [birth year] (67 y.o. Male) Treating RN: Montey Hora Primary Care Karman Veney: Myrtie Hawk Other Clinician: Referring Emillia Weatherly: Myrtie Hawk Treating Yovany Clock/Extender: Cathie Olden in Treatment:  30 Wound Status Wound Number: 5 Primary Diabetic Wound/Ulcer of the Lower Extremity Etiology: Wound Location: Left Toe Great Wound Open Wounding Event: Gradually Appeared Status: Date Acquired: 06/11/2016 Comorbid Anemia, Lymphedema, Congestive Heart Weeks Of Treatment: 30 History: Failure, Hypertension, Peripheral Venous Clustered Wound: No Disease, Type II Diabetes, Neuropathy Pending Amputation On Presentation Photos Photo Uploaded By: Montey Hora on 04/25/2017 10:56:45 Wound Measurements Length: (cm) 2 Width: (cm) 2.3 Depth: (cm) 0.1 Area: (cm) 3.613 Volume: (cm) 0.361 % Reduction in Area: 74.7% % Reduction in Volume: 74.7% Epithelialization: None Tunneling: No Undermining: No Wound Description Classification: Grade 2 Wound Margin: Flat and Intact Exudate Amount: Large Exudate Type: Serous Exudate Color: amber Foul Odor After Cleansing: Yes Due to Product Use: No Slough/Fibrino No Wound Bed Granulation Amount: Medium (34-66%) Exposed Structure Granulation Quality: Red Fascia Exposed: No Necrotic Amount: Small (1-33%) Fat Layer (Subcutaneous Tissue) Exposed: Yes Necrotic Quality: Adherent Slough Tendon Exposed: No Muscle Exposed: No Joint Exposed: No Bone Exposed: No Periwound Skin Texture Scheu, Ashtan E. ([id number]) Texture Color No Abnormalities Noted: No No Abnormalities Noted: No Callus: No Atrophie Blanche: No Crepitus: No Cyanosis: No Excoriation: No Ecchymosis: No Induration: No Erythema: No Rash: No Hemosiderin Staining: No Scarring: No Mottled: No Pallor: No Moisture Rubor: No No Abnormalities Noted: No Dry / Scaly: No Temperature / Pain Maceration: Yes Temperature: No Abnormality Wound Preparation Ulcer Cleansing: Rinsed/Irrigated with Saline Topical Anesthetic Applied: Other: lidocaine 4%, Treatment Notes Wound #5 (Left Toe Great) 1. Cleansed with: Clean wound with Normal Saline 2. Anesthetic Topical Lidocaine 4%  cream to wound bed prior to debridement 3. Peri-wound Care: Antifungal cream 4. Dressing Applied: Other dressing (specify in notes) 5. Secondary Dressing Applied Dry Gauze Kerlix/Conform 7. Secured with Tape Notes silvercel, netting Electronic Signature(s) Signed: 04/25/2017 10:52:34 AM By: Montey Hora Entered By: Montey Hora on 04/25/2017 10:52:33 [name], [name] ([id number]) -------------------------------------------------------------------------------- Wound Assessment Details Patient Name: [name], [name] Date of Service: 04/25/2017 9:15 AM Medical Record Number: [id number] Patient Account Number: [id number] Date of Birth/Sex: [birth year] (67 y.o. Male) Treating RN: Montey Hora Primary Care Omare Bilotta: Myrtie Hawk Other Clinician: Referring Oleg Oleson: Myrtie Hawk Treating Malayja Freund/Extender: Cathie Olden in Treatment: 30 Wound Status Wound Number: 6 Primary Diabetic Wound/Ulcer of the Lower Extremity Etiology: Wound Location: Left Toe Second Wound Open Wounding Event: Gradually Appeared Status: Date Acquired: 06/11/2016 Comorbid Anemia, Lymphedema, Congestive Heart Weeks Of Treatment: 30 History: Failure, Hypertension, Peripheral Venous Clustered Wound: No Disease, Type II Diabetes, Neuropathy Pending Amputation On Presentation Photos Photo Uploaded By: Montey Hora on 04/25/2017 10:56:46 Wound Measurements Length: (cm) 0.5 Width: (cm) 0.5 Depth: (cm) 0.1 Area: (cm) 0.196 Volume: (cm) 0.02 % Reduction in Area: 90.8% % Reduction in Volume: 90.7% Epithelialization: None Tunneling: No Undermining: No Wound Description Classification: Grade 2 Wound Margin: Flat and Intact Exudate Amount: Medium Exudate Type: Serous Exudate Color: amber Foul Odor After Cleansing: Yes Due to Product Use: No Slough/Fibrino No Wound Bed Granulation Amount: Medium (34-66%) Exposed Structure Granulation Quality: Red Fascia Exposed:  No Necrotic Amount: Small (1-33%) Fat Layer (Subcutaneous Tissue) Exposed: Yes Necrotic Quality: Adherent Slough Tendon Exposed: No Muscle Exposed: No Joint Exposed: No Bone Exposed: No Periwound Skin Texture Faron, Zymiere E. ([id number]) Texture Color No Abnormalities Noted: No No Abnormalities Noted: No Callus:  No Atrophie Blanche: No Crepitus: No Cyanosis: No Excoriation: No Ecchymosis: No Induration: No Erythema: No Rash: No Hemosiderin Staining: No Scarring: No Mottled: No Pallor: No Moisture Rubor: No No Abnormalities Noted: No Dry / Scaly: No Temperature / Pain Maceration: Yes Temperature: No Abnormality Wound Preparation Ulcer Cleansing: Rinsed/Irrigated with Saline Topical Anesthetic Applied: Other: lidocaine 4%, Treatment Notes Wound #6 (Left Toe Second) 1. Cleansed with: Clean wound with Normal Saline 2. Anesthetic Topical Lidocaine 4% cream to wound bed prior to debridement 3. Peri-wound Care: Antifungal cream 4. Dressing Applied: Other dressing (specify in notes) 5. Secondary Dressing Applied Dry Gauze Kerlix/Conform 7. Secured with Tape Notes silvercel, netting Electronic Signature(s) Signed: 04/25/2017 10:53:14 AM By: Montey Hora Entered By: Montey Hora on 04/25/2017 10:53:13 [place], [name] ([id number]) -------------------------------------------------------------------------------- Wound Assessment Details Patient Name: Beaudoin, Keante E. Date of Service: 04/25/2017 9:15 AM Medical Record Number: [id number] Patient Account Number: [id number] Date of Birth/Sex: 02-Jan-1951 (67 y.o. Male) Treating RN: Montey Hora Primary Care Yaminah Clayborn: Myrtie Hawk Other Clinician: Referring Danely Bayliss: Myrtie Hawk Treating Candice Tobey/Extender: Cathie Olden in Treatment: 30 Wound Status Wound Number: 9 Primary Diabetic Wound/Ulcer of the Lower Extremity Etiology: Wound Location: Right Toe Third Wound Open Wounding Event:  Gradually Appeared Status: Date Acquired: 12/31/2016 Comorbid Anemia, Lymphedema, Congestive Heart Weeks Of Treatment: 16 History: Failure, Hypertension, Peripheral Venous Clustered Wound: No Disease, Type II Diabetes, Neuropathy Photos Photo Uploaded By: Montey Hora on 04/25/2017 10:57:09 Wound Measurements Length: (cm) 0.6 Width: (cm) 0.3 Depth: (cm) 0.1 Area: (cm) 0.141 Volume: (cm) 0.014 % Reduction in Area: 64.1% % Reduction in Volume: 64.1% Epithelialization: None Tunneling: No Undermining: No Wound Description Classification: Grade 2 Wound Margin: Flat and Intact Exudate Amount: Large Exudate Type: Serous Exudate Color: amber Foul Odor After Cleansing: Yes Due to Product Use: No Slough/Fibrino No Wound Bed Granulation Amount: Medium (34-66%) Exposed Structure Granulation Quality: Red Fascia Exposed: No Necrotic Amount: Small (1-33%) Fat Layer (Subcutaneous Tissue) Exposed: Yes Necrotic Quality: Adherent Slough Tendon Exposed: No Muscle Exposed: No Joint Exposed: No Bone Exposed: No Periwound Skin Texture Ricciuti, Sadarius E. ([id number]) Texture Color No Abnormalities Noted: No No Abnormalities Noted: No Callus: No Atrophie Blanche: No Crepitus: No Cyanosis: No Excoriation: No Ecchymosis: No Induration: No Erythema: No Rash: No Hemosiderin Staining: No Scarring: No Mottled: No Pallor: No Moisture Rubor: No No Abnormalities Noted: No Dry / Scaly: No Temperature / Pain Maceration: Yes Temperature: No Abnormality Wound Preparation Ulcer Cleansing: Rinsed/Irrigated with Saline Topical Anesthetic Applied: Other: lidocaine 4%, Treatment Notes Wound #9 (Right Toe Third) 1. Cleansed with: Clean wound with Normal Saline 2. Anesthetic Topical Lidocaine 4% cream to wound bed prior to debridement 3. Peri-wound Care: Antifungal cream 4. Dressing Applied: Other dressing (specify in notes) 5. Secondary Dressing Applied Dry  Gauze Kerlix/Conform 7. Secured with Tape Notes silvercel, netting Electronic Signature(s) Signed: 04/25/2017 10:53:51 AM By: Montey Hora Entered By: Montey Hora on 04/25/2017 10:53:50 Goddard, [name] ([id number]) -------------------------------------------------------------------------------- Vitals Details Patient Name: Brownlee, Emmons E. Date of Service: 04/25/2017 9:15 AM Medical Record Number: [id number] Patient Account Number: [id number] Date of Birth/Sex: Dec 15, 1950 (67 y.o. Male) Treating RN: Montey Hora Primary Care Nik Gorrell: Myrtie Hawk Other Clinician: Referring Caidon Foti: Myrtie Hawk Treating Jed Kutch/Extender: Cathie Olden in Treatment: 30 Vital Signs Time Taken: 09:55 Temperature (F): 98.1 Height (in): 69 Pulse (bpm): 72 Weight (lbs): 168 Respiratory Rate (breaths/min): 16 Body Mass Index (BMI): 24.8 Blood Pressure (mmHg): 161/76 Reference Range: 80 - 120 mg / dl Electronic Signature(s) Signed: 04/25/2017 5:15:39 PM By: Marjory Lies,  Di Kindle Entered By: Montey Hora on 04/25/2017 09:55:21

## 2017-04-30 ENCOUNTER — Inpatient Hospital Stay: Payer: Medicare HMO

## 2017-04-30 VITALS — BP 170/84 | HR 66

## 2017-04-30 DIAGNOSIS — Z862 Personal history of diseases of the blood and blood-forming organs and certain disorders involving the immune mechanism: Secondary | ICD-10-CM

## 2017-04-30 DIAGNOSIS — D638 Anemia in other chronic diseases classified elsewhere: Secondary | ICD-10-CM

## 2017-04-30 DIAGNOSIS — D509 Iron deficiency anemia, unspecified: Secondary | ICD-10-CM

## 2017-04-30 DIAGNOSIS — I130 Hypertensive heart and chronic kidney disease with heart failure and stage 1 through stage 4 chronic kidney disease, or unspecified chronic kidney disease: Secondary | ICD-10-CM | POA: Diagnosis not present

## 2017-04-30 DIAGNOSIS — N189 Chronic kidney disease, unspecified: Secondary | ICD-10-CM

## 2017-04-30 DIAGNOSIS — D649 Anemia, unspecified: Secondary | ICD-10-CM

## 2017-04-30 LAB — HEMOGLOBIN: HEMOGLOBIN: 8.2 g/dL — AB (ref 13.0–18.0)

## 2017-04-30 MED ORDER — EPOETIN ALFA 40000 UNIT/ML IJ SOLN
40000.0000 [IU] | INTRAMUSCULAR | Status: DC
  Administered 2017-04-30: 40000 [IU] via SUBCUTANEOUS
  Filled 2017-04-30: qty 1

## 2017-04-30 NOTE — Progress Notes (Signed)
Informed Dr. Janese Banks that BP is 169/85 (1st attempt) and 169/85 (2nd attemp).  MD approves to proceed with Procrit today.

## 2017-05-02 ENCOUNTER — Encounter: Payer: Medicare HMO | Admitting: Nurse Practitioner

## 2017-05-02 DIAGNOSIS — E11621 Type 2 diabetes mellitus with foot ulcer: Secondary | ICD-10-CM | POA: Diagnosis not present

## 2017-05-03 NOTE — Progress Notes (Signed)
PRISCILLA, FINKLEA ([id number]) Visit Report for 05/02/2017 Arrival Information Details Patient Name: [name] Date of Service: 05/02/2017 12:30 PM Medical Record Number: [id number] Patient Account Number: [id number] Date of Birth/Sex: 1950/07/05 (67 y.o. Male) Treating RN: Montey Hora Primary Care Miya Luviano: Myrtie Hawk Other Clinician: Referring Jakyren Fluegge: Myrtie Hawk Treating Tanner Vigna/Extender: Cathie Olden in Treatment: 51 Visit Information History Since Last Visit Added or deleted any medications: No Patient Arrived: Ambulatory Any new allergies or adverse reactions: No Arrival Time: 12:41 Had a fall or experienced change in No Accompanied By: brother activities of daily living that may affect Transfer Assistance: None risk of falls: Patient Identification Verified: Yes Signs or symptoms of abuse/neglect since last visito No Secondary Verification Process Completed: Yes Hospitalized since last visit: No Patient Requires Transmission-Based No Has Dressing in Place as Prescribed: Yes Precautions: Pain Present Now: No Patient Has Alerts: Yes Patient Alerts: DMII Electronic Signature(s) Signed: 05/02/2017 3:59:18 PM By: Montey Hora Entered By: Montey Hora on 05/02/2017 12:42:06 [name] ([id number]) -------------------------------------------------------------------------------- Clinic Level of Care Assessment Details Patient Name: [name], [name] Date of Service: 05/02/2017 12:30 PM Medical Record Number: [id number] Patient Account Number: [id number] Date of Birth/Sex: 02-21-1951 (67 y.o. Male) Treating RN: Montey Hora Primary Care Coretha Creswell: Myrtie Hawk Other Clinician: Referring Semaja Lymon: Myrtie Hawk Treating Kavari Parrillo/Extender: Cathie Olden in Treatment: 31 Clinic Level of Care Assessment Items TOOL 4 Quantity Score []  - Use when only an EandM is performed on FOLLOW-UP visit 0 ASSESSMENTS -  Nursing Assessment / Reassessment X - Reassessment of Co-morbidities (includes updates in patient status) 1 10 X- 1 5 Reassessment of Adherence to Treatment Plan ASSESSMENTS - Wound and Skin Assessment / Reassessment []  - Simple Wound Assessment / Reassessment - one wound 0 X- 5 5 Complex Wound Assessment / Reassessment - multiple wounds []  - 0 Dermatologic / Skin Assessment (not related to wound area) ASSESSMENTS - Focused Assessment []  - Circumferential Edema Measurements - multi extremities 0 []  - 0 Nutritional Assessment / Counseling / Intervention X- 1 5 Lower Extremity Assessment (monofilament, tuning fork, pulses) []  - 0 Peripheral Arterial Disease Assessment (using hand held doppler) ASSESSMENTS - Ostomy and/or Continence Assessment and Care []  - Incontinence Assessment and Management 0 []  - 0 Ostomy Care Assessment and Management (repouching, etc.) PROCESS - Coordination of Care X - Simple Patient / Family Education for ongoing care 1 15 []  - 0 Complex (extensive) Patient / Family Education for ongoing care []  - 0 Staff obtains Programmer, systems, Records, Test Results / Process Orders []  - 0 Staff telephones HHA, Nursing Homes / Clarify orders / etc []  - 0 Routine Transfer to another Facility (non-emergent condition) []  - 0 Routine Hospital Admission (non-emergent condition) []  - 0 New Admissions / Biomedical engineer / Ordering NPWT, Apligraf, etc. []  - 0 Emergency Hospital Admission (emergent condition) X- 1 10 Simple Discharge Coordination [name], [name] ([id number]) []  - 0 Complex (extensive) Discharge Coordination PROCESS - Special Needs []  - Pediatric / Minor Patient Management 0 []  - 0 Isolation Patient Management []  - 0 Hearing / Language / Visual special needs []  - 0 Assessment of Community assistance (transportation, D/C planning, etc.) []  - 0 Additional assistance / Altered mentation []  - 0 Support Surface(s) Assessment (bed, cushion, seat,  etc.) INTERVENTIONS - Wound Cleansing / Measurement []  - Simple Wound Cleansing - one wound 0 X- 5 5 Complex Wound Cleansing - multiple wounds X- 1 5 Wound Imaging (photographs - any number of wounds) []  - 0 Wound Tracing (  instead of photographs) []  - 0 Simple Wound Measurement - one wound X- 5 5 Complex Wound Measurement - multiple wounds INTERVENTIONS - Wound Dressings X - Small Wound Dressing one or multiple wounds 5 10 []  - 0 Medium Wound Dressing one or multiple wounds []  - 0 Large Wound Dressing one or multiple wounds []  - 0 Application of Medications - topical []  - 0 Application of Medications - injection INTERVENTIONS - Miscellaneous []  - External ear exam 0 []  - 0 Specimen Collection (cultures, biopsies, blood, body fluids, etc.) []  - 0 Specimen(s) / Culture(s) sent or taken to Lab for analysis []  - 0 Patient Transfer (multiple staff / Civil Service fast streamer / Similar devices) []  - 0 Simple Staple / Suture removal (25 or less) []  - 0 Complex Staple / Suture removal (26 or more) []  - 0 Hypo / Hyperglycemic Management (close monitor of Blood Glucose) []  - 0 Ankle / Brachial Index (ABI) - do not check if billed separately X- 1 5 Vital Signs [name], [name] ([id number]) Has the patient been seen at the hospital within the last three years: Yes Total Score: 180 Level Of Care: New/Established - Level 5 Electronic Signature(s) Signed: 05/02/2017 3:59:18 PM By: Montey Hora Entered By: Montey Hora on 05/02/2017 13:23:15 [name] ([id number]) -------------------------------------------------------------------------------- Encounter Discharge Information Details Patient Name: [name], [name] Date of Service: 05/02/2017 12:30 PM Medical Record Number: [id number] Patient Account Number: [id number] Date of Birth/Sex: 1950/07/06 (67 y.o. Male) Treating RN: Montey Hora Primary Care Jalonda Antigua: Myrtie Hawk Other Clinician: Referring Emmitt Matthews: Myrtie Hawk Treating Alsace Dowd/Extender: Cathie Olden in Treatment: 60 Encounter Discharge Information Items Discharge Pain Level: 0 Discharge Condition: Stable Ambulatory Status: Ambulatory Discharge Destination: Home Transportation: Private Auto Accompanied By: self Schedule Follow-up Appointment: Yes Medication Reconciliation completed and No provided to Patient/Care Jaimi Belle: Provided on Clinical Summary of Care: 05/02/2017 Form Type Recipient Paper Patient ED Electronic Signature(s) Signed: 05/02/2017 1:24:49 PM By: Montey Hora Entered By: Montey Hora on 05/02/2017 13:24:49 Jayson, [name] ([id number]) -------------------------------------------------------------------------------- Lower Extremity Assessment Details Patient Name: [name], [name] Date of Service: 05/02/2017 12:30 PM Medical Record Number: [id number] Patient Account Number: [id number] Date of Birth/Sex: [birth year] (67 y.o. Male) Treating RN: Montey Hora Primary Care Hermen Mario: Myrtie Hawk Other Clinician: Referring Tamas Suen: Myrtie Hawk Treating Sirius Woodford/Extender: Cathie Olden in Treatment: 31 Vascular Assessment Pulses: Dorsalis Pedis Palpable: [Left:Yes] [Right:Yes] Posterior Tibial Extremity colors, hair growth, and conditions: Extremity Color: [Left:Hyperpigmented] [Right:Hyperpigmented] Hair Growth on Extremity: [Left:No] [Right:No] Temperature of Extremity: [Left:Warm] [Right:Warm] Capillary Refill: [Left:< 3 seconds] [Right:< 3 seconds] Electronic Signature(s) Signed: 05/02/2017 3:59:18 PM By: Montey Hora Entered By: Montey Hora on 05/02/2017 12:50:57 Boeder, Xzavien E. ([id number]) -------------------------------------------------------------------------------- Multi Wound Chart Details Patient Name: [name], [name] Date of Service: 05/02/2017 12:30 PM Medical Record Number: [id number] Patient Account Number: [id number] Date of Birth/Sex: [birth year] (68 y.o.  Male) Treating RN: Montey Hora Primary Care Atticus Wedin: Myrtie Hawk Other Clinician: Referring Jozi Malachi: Myrtie Hawk Treating Lenoard Helbert/Extender: Cathie Olden in Treatment: 31 Vital Signs Height(in): 4 Pulse(bpm): 60 Weight(lbs): 168 Blood Pressure(mmHg): 164/77 Body Mass Index(BMI): 25 Temperature(F): 98.0 Respiratory Rate 16 (breaths/min): Photos: [1:No Photos] [2:No Photos] [5:No Photos] Wound Location: [1:Right Toe Great] [2:Right Toe Second] [5:Left Toe Great] Wounding Event: [1:Gradually Appeared] [2:Gradually Appeared] [5:Gradually Appeared] Primary Etiology: [1:Diabetic Wound/Ulcer of the Lower Extremity] [2:Diabetic Wound/Ulcer of the Lower Extremity] [5:Diabetic Wound/Ulcer of the Lower Extremity] Comorbid History: [1:Anemia, Lymphedema, Congestive Heart Failure, Hypertension, Peripheral Venous Disease, Type II Diabetes, Neuropathy] [2:Anemia, Lymphedema, Congestive Heart Failure, Hypertension,  Peripheral Venous Disease, Type II Diabetes,  Neuropathy] [5:Anemia, Lymphedema, Congestive Heart Failure, Hypertension, Peripheral Venous Disease, Type II Diabetes, Neuropathy] Date Acquired: [1:06/11/2016] [2:06/11/2016] [5:06/11/2016] Weeks of Treatment: [1:31] [2:31] [5:31] Wound Status: [1:Open] [2:Open] [5:Open] Pending Amputation on [1:Yes] [2:Yes] [5:Yes] Presentation: Measurements L x W x D [1:0.8x5.4x0.1] [2:1.3x1.9x0.1] [5:1.5x1.8x0.1] (cm) Area (cm) : [1:3.393] [2:1.94] [5:2.121] Volume (cm) : [1:0.339] [2:0.194] [5:0.212] % Reduction in Area: [1:92.80%] [2:36.80%] [5:85.20%] % Reduction in Volume: [1:92.80%] [2:36.80%] [5:85.20%] Classification: [1:Grade 2] [2:Grade 2] [5:Grade 2] Exudate Amount: [1:Large] [2:Large] [5:Large] Exudate Type: [1:Serous] [2:Serous] [5:Serous] Exudate Color: [1:amber] [2:amber] [5:amber] Foul Odor After Cleansing: [1:Yes] [2:Yes] [5:Yes] Odor Anticipated Due to [1:No] [2:No] [5:No] Product Use: Wound  Margin: [1:Flat and Intact] [2:Flat and Intact] [5:Flat and Intact] Granulation Amount: [1:Medium (34-66%)] [2:Medium (34-66%)] [5:Medium (34-66%)] Granulation Quality: [1:Red] [2:Red] [5:Red] Necrotic Amount: [1:Small (1-33%)] [2:Small (1-33%)] [5:Small (1-33%)] Exposed Structures: [1:Fat Layer (Subcutaneous Tissue) Exposed: Yes Fascia: No Tendon: No Muscle: No] [2:Fat Layer (Subcutaneous Tissue) Exposed: Yes Fascia: No Tendon: No Muscle: No] [5:Fat Layer (Subcutaneous Tissue) Exposed: Yes Fascia: No Tendon: No Muscle: No] Joint: No Joint: No Joint: No Bone: No Bone: No Bone: No Epithelialization: None None None Periwound Skin Texture: Excoriation: Yes Excoriation: No Excoriation: No Induration: No Induration: No Induration: No Callus: No Callus: No Callus: No Crepitus: No Crepitus: No Crepitus: No Rash: No Rash: No Rash: No Scarring: No Scarring: No Scarring: No Periwound Skin Moisture: Maceration: Yes Maceration: Yes Maceration: Yes Dry/Scaly: No Dry/Scaly: No Dry/Scaly: No Periwound Skin Color: Atrophie Blanche: No Atrophie Blanche: No Atrophie Blanche: No Cyanosis: No Cyanosis: No Cyanosis: No Ecchymosis: No Ecchymosis: No Ecchymosis: No Erythema: No Erythema: No Erythema: No Hemosiderin Staining: No Hemosiderin Staining: No Hemosiderin Staining: No Mottled: No Mottled: No Mottled: No Pallor: No Pallor: No Pallor: No Rubor: No Rubor: No Rubor: No Temperature: No Abnormality No Abnormality No Abnormality Tenderness on Palpation: No No No Wound Preparation: Ulcer Cleansing: Ulcer Cleansing: Ulcer Cleansing: Rinsed/Irrigated with Saline Rinsed/Irrigated with Saline Rinsed/Irrigated with Saline Topical Anesthetic Applied: Topical Anesthetic Applied: Topical Anesthetic Applied: Other: lidocaine 4% Other: lidocaine 4% Other: lidocaine 4% Wound Number: 6 9 N/A Photos: No Photos No Photos N/A Wound Location: Left Toe Second Right Toe Third  N/A Wounding Event: Gradually Appeared Gradually Appeared N/A Primary Etiology: Diabetic Wound/Ulcer of the Diabetic Wound/Ulcer of the N/A Lower Extremity Lower Extremity Comorbid History: Anemia, Lymphedema, Anemia, Lymphedema, N/A Congestive Heart Failure, Congestive Heart Failure, Hypertension, Peripheral Hypertension, Peripheral Venous Disease, Type II Venous Disease, Type II Diabetes, Neuropathy Diabetes, Neuropathy Date Acquired: 06/11/2016 12/31/2016 N/A Weeks of Treatment: 31 17 N/A Wound Status: Open Open N/A Pending Amputation on Yes No N/A Presentation: Measurements L x W x D 1x1.3x0.1 0.8x0.7x0.1 N/A (cm) Area (cm) : 1.021 0.44 N/A Volume (cm) : 0.102 0.044 N/A % Reduction in Area: 52.20% -12.00% N/A % Reduction in Volume: 52.30% -12.80% N/A Classification: Grade 2 Grade 2 N/A Exudate Amount: Medium Large N/A Exudate Type: Serous Serous N/A Exudate Color: amber amber N/A Foul Odor After Cleansing: Yes Yes N/A Odor Anticipated Due to No No N/A Product Use: Wound Margin: Flat and Intact Flat and Intact N/A Granulation Amount: Medium (34-66%) Medium (34-66%) N/A Harkin, Jerris E. ([id number]) Granulation Quality: Red Red N/A Necrotic Amount: Small (1-33%) Small (1-33%) N/A Exposed Structures: Fat Layer (Subcutaneous Fat Layer (Subcutaneous N/A Tissue) Exposed: Yes Tissue) Exposed: Yes Fascia: No Fascia: No Tendon: No Tendon: No Muscle: No Muscle: No Joint: No Joint: No Bone: No Bone: No Epithelialization:  None None N/A Periwound Skin Texture: Excoriation: No Excoriation: No N/A Induration: No Induration: No Callus: No Callus: No Crepitus: No Crepitus: No Rash: No Rash: No Scarring: No Scarring: No Periwound Skin Moisture: Maceration: Yes Maceration: Yes N/A Dry/Scaly: No Dry/Scaly: No Periwound Skin Color: Atrophie Blanche: No Atrophie Blanche: No N/A Cyanosis: No Cyanosis: No Ecchymosis: No Ecchymosis: No Erythema: No Erythema:  No Hemosiderin Staining: No Hemosiderin Staining: No Mottled: No Mottled: No Pallor: No Pallor: No Rubor: No Rubor: No Temperature: No Abnormality No Abnormality N/A Tenderness on Palpation: No No N/A Wound Preparation: Ulcer Cleansing: Ulcer Cleansing: N/A Rinsed/Irrigated with Saline Rinsed/Irrigated with Saline Topical Anesthetic Applied: Topical Anesthetic Applied: Other: lidocaine 4% Other: lidocaine 4% Treatment Notes Electronic Signature(s) Signed: 05/02/2017 4:11:02 PM By: Lawanda Cousins Entered By: Lawanda Cousins on 05/02/2017 13:05:13 [name], [name] ([id number]) -------------------------------------------------------------------------------- [place] Details Patient Name: [name], [name] Date of Service: 05/02/2017 12:30 PM Medical Record Number: [id number] Patient Account Number: [id number] Date of Birth/Sex: 1950/11/12 (67 y.o. Male) Treating RN: Montey Hora Primary Care Braya Habermehl: Myrtie Hawk Other Clinician: Referring Abdikadir Fohl: Myrtie Hawk Treating Lilyona Richner/Extender: Cathie Olden in Treatment: 31 Active Inactive ` Abuse / Safety / Falls / Self Care Management Nursing Diagnoses: Potential for falls Goals: Patient will remain injury free related to falls Date Initiated: 09/21/2016 Target Resolution Date: 11/30/2016 Goal Status: Active Interventions: Assess fall risk on admission and as needed Notes: ` Nutrition Nursing Diagnoses: Potential for alteratiion in Nutrition/Potential for imbalanced nutrition Goals: Patient/caregiver agrees to and verbalizes understanding of need to use nutritional supplements and/or vitamins as prescribed Date Initiated: 09/21/2016 Target Resolution Date: 11/30/2016 Goal Status: Active Interventions: Assess patient nutrition upon admission and as needed per policy Notes: ` Orientation to the Wound Care Program Nursing Diagnoses: Knowledge deficit related to the wound healing  center program Goals: Patient/caregiver will verbalize understanding of the [facility] Date Initiated: 09/21/2016 Target Resolution Date: 11/30/2016 Goal Status: Active Interventions: DARKKlinton, Candelas ([id number]) Provide education on orientation to the wound center Notes: ` Wound/Skin Impairment Nursing Diagnoses: Knowledge deficit related to smoking impact on wound healing Goals: Ulcer/skin breakdown will have a volume reduction of 30% by week 4 Date Initiated: 09/21/2016 Target Resolution Date: 11/30/2016 Goal Status: Active Ulcer/skin breakdown will have a volume reduction of 50% by week 8 Date Initiated: 09/21/2016 Target Resolution Date: 11/30/2016 Goal Status: Active Ulcer/skin breakdown will have a volume reduction of 80% by week 12 Date Initiated: 09/21/2016 Target Resolution Date: 11/30/2016 Goal Status: Active Ulcer/skin breakdown will heal within 14 weeks Date Initiated: 09/21/2016 Target Resolution Date: 11/30/2016 Goal Status: Active Interventions: Assess patient/caregiver ability to obtain necessary supplies Assess patient/caregiver ability to perform ulcer/skin care regimen upon admission and as needed Assess ulceration(s) every visit Notes: Electronic Signature(s) Signed: 05/02/2017 3:59:18 PM By: Montey Hora Entered By: Montey Hora on 05/02/2017 12:58:29 Wixom, [name] ([id number]) -------------------------------------------------------------------------------- Pain Assessment Details Patient Name: Emry, Donevin E. Date of Service: 05/02/2017 12:30 PM Medical Record Number: [id number] Patient Account Number: [id number] Date of Birth/Sex: 05-16-1950 (67 y.o. Male) Treating RN: Montey Hora Primary Care Kynnadi Dicenso: Myrtie Hawk Other Clinician: Referring Farzana Koci: Myrtie Hawk Treating Adolphus Hanf/Extender: Cathie Olden in Treatment: 31 Active Problems Location of Pain Severity and Description of Pain Patient Has  Paino No Site Locations Pain Management and Medication Current Pain Management: Notes Topical or injectable lidocaine is offered to patient for acute pain when surgical debridement is performed. If needed, Patient is instructed to use over the counter pain medication for the  following 24-48 hours after debridement. Wound care MDs do not prescribed pain medications. Patient has chronic pain or uncontrolled pain. Patient has been instructed to make an appointment with their Primary Care Physician for pain management. Electronic Signature(s) Signed: 05/02/2017 3:59:18 PM By: Montey Hora Entered By: Montey Hora on 05/02/2017 12:42:14 Gerads, [name] ([id number]) -------------------------------------------------------------------------------- Patient/Caregiver Education Details Patient Name: [name], [name] Date of Service: 05/02/2017 12:30 PM Medical Record Number: [id number] Patient Account Number: [id number] Date of Birth/Gender: 11-21-1950 (67 y.o. Male) Treating RN: Montey Hora Primary Care Physician: Myrtie Hawk Other Clinician: Referring Physician: Myrtie Hawk Treating Physician/Extender: Cathie Olden in Treatment: 65 Education Assessment Education Provided To: Patient Education Topics Provided Wound/Skin Impairment: Handouts: Other: stop using antifungal cream Methods: Demonstration, Explain/Verbal Responses: State content correctly Electronic Signature(s) Signed: 05/02/2017 3:59:18 PM By: Montey Hora Entered By: Montey Hora on 05/02/2017 13:25:20 Mccleary, [name] ([id number]) -------------------------------------------------------------------------------- Wound Assessment Details Patient Name: [name], [name] Date of Service: 05/02/2017 12:30 PM Medical Record Number: [id number] Patient Account Number: [id number] Date of Birth/Sex: Dec 01, 1950 (67 y.o. Male) Treating RN: Montey Hora Primary Care Deondrea Aguado: Myrtie Hawk Other  Clinician: Referring Danaysia Rader: Myrtie Hawk Treating Laine Giovanetti/Extender: Cathie Olden in Treatment: 31 Wound Status Wound Number: 1 Primary Diabetic Wound/Ulcer of the Lower Extremity Etiology: Wound Location: Right Toe Great Wound Open Wounding Event: Gradually Appeared Status: Date Acquired: 06/11/2016 Comorbid Anemia, Lymphedema, Congestive Heart Weeks Of Treatment: 31 History: Failure, Hypertension, Peripheral Venous Clustered Wound: No Disease, Type II Diabetes, Neuropathy Pending Amputation On Presentation Photos Photo Uploaded By: Montey Hora on 05/02/2017 14:52:01 Wound Measurements Length: (cm) 0.8 Width: (cm) 5.4 Depth: (cm) 0.1 Area: (cm) 3.393 Volume: (cm) 0.339 % Reduction in Area: 92.8% % Reduction in Volume: 92.8% Epithelialization: None Tunneling: No Undermining: No Wound Description Classification: Grade 2 Wound Margin: Flat and Intact Exudate Amount: Large Exudate Type: Serous Exudate Color: amber Foul Odor After Cleansing: Yes Due to Product Use: No Slough/Fibrino No Wound Bed Granulation Amount: Medium (34-66%) Exposed Structure Granulation Quality: Red Fascia Exposed: No Necrotic Amount: Small (1-33%) Fat Layer (Subcutaneous Tissue) Exposed: Yes Necrotic Quality: Adherent Slough Tendon Exposed: No Muscle Exposed: No Joint Exposed: No Bone Exposed: No Periwound Skin Texture Brymer, Domenik E. ([id number]) Texture Color No Abnormalities Noted: No No Abnormalities Noted: No Callus: No Atrophie Blanche: No Crepitus: No Cyanosis: No Excoriation: Yes Ecchymosis: No Induration: No Erythema: No Rash: No Hemosiderin Staining: No Scarring: No Mottled: No Pallor: No Moisture Rubor: No No Abnormalities Noted: No Dry / Scaly: No Temperature / Pain Maceration: Yes Temperature: No Abnormality Wound Preparation Ulcer Cleansing: Rinsed/Irrigated with Saline Topical Anesthetic Applied: Other: lidocaine 4%, Treatment  Notes Wound #1 (Right Toe Great) 1. Cleansed with: Clean wound with Normal Saline 2. Anesthetic Topical Lidocaine 4% cream to wound bed prior to debridement 4. Dressing Applied: Other dressing (specify in notes) 5. Secondary Dressing Applied Dry Gauze Kerlix/Conform 7. Secured with Tape Notes silvercel, netting Electronic Signature(s) Signed: 05/02/2017 3:59:18 PM By: Montey Hora Entered By: Montey Hora on 05/02/2017 12:47:36 Lyter, [name] ([id number]) -------------------------------------------------------------------------------- Wound Assessment Details Patient Name: [name], [name] Date of Service: 05/02/2017 12:30 PM Medical Record Number: [id number] Patient Account Number: [id number] Date of Birth/Sex: [birth year] (67 y.o. Male) Treating RN: Montey Hora Primary Care Anaisabel Pederson: Myrtie Hawk Other Clinician: Referring Lailanie Hasley: Myrtie Hawk Treating Anija Brickner/Extender: Cathie Olden in Treatment: 31 Wound Status Wound Number: 2 Primary Diabetic Wound/Ulcer of the Lower Extremity Etiology: Wound Location: Right Toe Second Wound Open Wounding Event: Gradually Appeared Status: Date  Acquired: 06/11/2016 Comorbid Anemia, Lymphedema, Congestive Heart Weeks Of Treatment: 31 History: Failure, Hypertension, Peripheral Venous Clustered Wound: No Disease, Type II Diabetes, Neuropathy Pending Amputation On Presentation Photos Photo Uploaded By: Montey Hora on 05/02/2017 14:51:35 Wound Measurements Length: (cm) 1.3 Width: (cm) 1.9 Depth: (cm) 0.1 Area: (cm) 1.94 Volume: (cm) 0.194 % Reduction in Area: 36.8% % Reduction in Volume: 36.8% Epithelialization: None Tunneling: No Undermining: No Wound Description Classification: Grade 2 Wound Margin: Flat and Intact Exudate Amount: Large Exudate Type: Serous Exudate Color: amber Foul Odor After Cleansing: Yes Due to Product Use: No Slough/Fibrino No Wound Bed Granulation Amount:  Medium (34-66%) Exposed Structure Granulation Quality: Red Fascia Exposed: No Necrotic Amount: Small (1-33%) Fat Layer (Subcutaneous Tissue) Exposed: Yes Necrotic Quality: Adherent Slough Tendon Exposed: No Muscle Exposed: No Joint Exposed: No Bone Exposed: No Periwound Skin Texture Dornbush, Viktor E. ([id number]) Texture Color No Abnormalities Noted: No No Abnormalities Noted: No Callus: No Atrophie Blanche: No Crepitus: No Cyanosis: No Excoriation: No Ecchymosis: No Induration: No Erythema: No Rash: No Hemosiderin Staining: No Scarring: No Mottled: No Pallor: No Moisture Rubor: No No Abnormalities Noted: No Dry / Scaly: No Temperature / Pain Maceration: Yes Temperature: No Abnormality Wound Preparation Ulcer Cleansing: Rinsed/Irrigated with Saline Topical Anesthetic Applied: Other: lidocaine 4%, Treatment Notes Wound #2 (Right Toe Second) 1. Cleansed with: Clean wound with Normal Saline 2. Anesthetic Topical Lidocaine 4% cream to wound bed prior to debridement 4. Dressing Applied: Other dressing (specify in notes) 5. Secondary Dressing Applied Dry Gauze Kerlix/Conform 7. Secured with Tape Notes silvercel, netting Electronic Signature(s) Signed: 05/02/2017 3:59:18 PM By: Montey Hora Entered By: Montey Hora on 05/02/2017 12:48:18 Limberg, [name] ([id number]) -------------------------------------------------------------------------------- Wound Assessment Details Patient Name: [name], [name] Date of Service: 05/02/2017 12:30 PM Medical Record Number: [id number] Patient Account Number: [id number] Date of Birth/Sex: 11/08/1950 (67 y.o. Male) Treating RN: Montey Hora Primary Care Marik Sedore: Myrtie Hawk Other Clinician: Referring Elyssia Strausser: Myrtie Hawk Treating Skylor Schnapp/Extender: Cathie Olden in Treatment: 31 Wound Status Wound Number: 5 Primary Diabetic Wound/Ulcer of the Lower Extremity Etiology: Wound Location: Left Toe  Great Wound Open Wounding Event: Gradually Appeared Status: Date Acquired: 06/11/2016 Comorbid Anemia, Lymphedema, Congestive Heart Weeks Of Treatment: 31 History: Failure, Hypertension, Peripheral Venous Clustered Wound: No Disease, Type II Diabetes, Neuropathy Pending Amputation On Presentation Photos Photo Uploaded By: Montey Hora on 05/02/2017 14:51:36 Wound Measurements Length: (cm) 1.5 Width: (cm) 1.8 Depth: (cm) 0.1 Area: (cm) 2.121 Volume: (cm) 0.212 % Reduction in Area: 85.2% % Reduction in Volume: 85.2% Epithelialization: None Tunneling: No Undermining: No Wound Description Classification: Grade 2 Wound Margin: Flat and Intact Exudate Amount: Large Exudate Type: Serous Exudate Color: amber Foul Odor After Cleansing: Yes Due to Product Use: No Slough/Fibrino No Wound Bed Granulation Amount: Medium (34-66%) Exposed Structure Granulation Quality: Red Fascia Exposed: No Necrotic Amount: Small (1-33%) Fat Layer (Subcutaneous Tissue) Exposed: Yes Necrotic Quality: Adherent Slough Tendon Exposed: No Muscle Exposed: No Joint Exposed: No Bone Exposed: No Periwound Skin Texture Cuadras, Melinda E. ([id number]) Texture Color No Abnormalities Noted: No No Abnormalities Noted: No Callus: No Atrophie Blanche: No Crepitus: No Cyanosis: No Excoriation: No Ecchymosis: No Induration: No Erythema: No Rash: No Hemosiderin Staining: No Scarring: No Mottled: No Pallor: No Moisture Rubor: No No Abnormalities Noted: No Dry / Scaly: No Temperature / Pain Maceration: Yes Temperature: No Abnormality Wound Preparation Ulcer Cleansing: Rinsed/Irrigated with Saline Topical Anesthetic Applied: Other: lidocaine 4%, Treatment Notes Wound #5 (Left Toe Great) 1. Cleansed with: Clean wound with Normal  Saline 2. Anesthetic Topical Lidocaine 4% cream to wound bed prior to debridement 4. Dressing Applied: Other dressing (specify in notes) 5. Secondary Dressing  Applied Dry Gauze Kerlix/Conform 7. Secured with Tape Notes silvercel, netting Electronic Signature(s) Signed: 05/02/2017 3:59:18 PM By: Montey Hora Entered By: Montey Hora on 05/02/2017 12:49:07 Kinnard, [name] ([id number]) -------------------------------------------------------------------------------- Wound Assessment Details Patient Name: [name], [name] Date of Service: 05/02/2017 12:30 PM Medical Record Number: [id number] Patient Account Number: [id number] Date of Birth/Sex: September 07, 1950 (67 y.o. Male) Treating RN: Montey Hora Primary Care Mabelle Mungin: Myrtie Hawk Other Clinician: Referring Shakirra Buehler: Myrtie Hawk Treating Clifton Kovacic/Extender: Cathie Olden in Treatment: 31 Wound Status Wound Number: 6 Primary Diabetic Wound/Ulcer of the Lower Extremity Etiology: Wound Location: Left Toe Second Wound Open Wounding Event: Gradually Appeared Status: Date Acquired: 06/11/2016 Comorbid Anemia, Lymphedema, Congestive Heart Weeks Of Treatment: 31 History: Failure, Hypertension, Peripheral Venous Clustered Wound: No Disease, Type II Diabetes, Neuropathy Pending Amputation On Presentation Photos Photo Uploaded By: Montey Hora on 05/02/2017 14:51:12 Wound Measurements Length: (cm) 1 Width: (cm) 1.3 Depth: (cm) 0.1 Area: (cm) 1.021 Volume: (cm) 0.102 % Reduction in Area: 52.2% % Reduction in Volume: 52.3% Epithelialization: None Tunneling: No Undermining: No Wound Description Classification: Grade 2 Wound Margin: Flat and Intact Exudate Amount: Medium Exudate Type: Serous Exudate Color: amber Foul Odor After Cleansing: Yes Due to Product Use: No Slough/Fibrino No Wound Bed Granulation Amount: Medium (34-66%) Exposed Structure Granulation Quality: Red Fascia Exposed: No Necrotic Amount: Small (1-33%) Fat Layer (Subcutaneous Tissue) Exposed: Yes Necrotic Quality: Adherent Slough Tendon Exposed: No Muscle Exposed: No Joint  Exposed: No Bone Exposed: No Periwound Skin Texture Headley, Lalo E. ([id number]) Texture Color No Abnormalities Noted: No No Abnormalities Noted: No Callus: No Atrophie Blanche: No Crepitus: No Cyanosis: No Excoriation: No Ecchymosis: No Induration: No Erythema: No Rash: No Hemosiderin Staining: No Scarring: No Mottled: No Pallor: No Moisture Rubor: No No Abnormalities Noted: No Dry / Scaly: No Temperature / Pain Maceration: Yes Temperature: No Abnormality Wound Preparation Ulcer Cleansing: Rinsed/Irrigated with Saline Topical Anesthetic Applied: Other: lidocaine 4%, Treatment Notes Wound #6 (Left Toe Second) 1. Cleansed with: Clean wound with Normal Saline 2. Anesthetic Topical Lidocaine 4% cream to wound bed prior to debridement 4. Dressing Applied: Other dressing (specify in notes) 5. Secondary Dressing Applied Dry Gauze Kerlix/Conform 7. Secured with Tape Notes silvercel, netting Electronic Signature(s) Signed: 05/02/2017 3:59:18 PM By: Montey Hora Entered By: Montey Hora on 05/02/2017 12:49:43 Nolley, [name] ([id number]) -------------------------------------------------------------------------------- Wound Assessment Details Patient Name: [name], [name] Date of Service: 05/02/2017 12:30 PM Medical Record Number: [id number] Patient Account Number: [id number] Date of Birth/Sex: 01/29/1951 (67 y.o. Male) Treating RN: Montey Hora Primary Care Khila Papp: Myrtie Hawk Other Clinician: Referring Nathan Moctezuma: Myrtie Hawk Treating Kessler Kopinski/Extender: Cathie Olden in Treatment: 31 Wound Status Wound Number: 9 Primary Diabetic Wound/Ulcer of the Lower Extremity Etiology: Wound Location: Right Toe Third Wound Open Wounding Event: Gradually Appeared Status: Date Acquired: 12/31/2016 Comorbid Anemia, Lymphedema, Congestive Heart Weeks Of Treatment: 17 History: Failure, Hypertension, Peripheral Venous Clustered Wound:  No Disease, Type II Diabetes, Neuropathy Photos Photo Uploaded By: Montey Hora on 05/02/2017 14:51:13 Wound Measurements Length: (cm) 0.8 Width: (cm) 0.7 Depth: (cm) 0.1 Area: (cm) 0.44 Volume: (cm) 0.044 % Reduction in Area: -12% % Reduction in Volume: -12.8% Epithelialization: None Tunneling: No Undermining: No Wound Description Classification: Grade 2 Wound Margin: Flat and Intact Exudate Amount: Large Exudate Type: Serous Exudate Color: amber Foul Odor After Cleansing: Yes Due to Product Use: No Slough/Fibrino No  Wound Bed Granulation Amount: Medium (34-66%) Exposed Structure Granulation Quality: Red Fascia Exposed: No Necrotic Amount: Small (1-33%) Fat Layer (Subcutaneous Tissue) Exposed: Yes Necrotic Quality: Adherent Slough Tendon Exposed: No Muscle Exposed: No Joint Exposed: No Bone Exposed: No Periwound Skin Texture Ginger, Kelven E. ([id number]) Texture Color No Abnormalities Noted: No No Abnormalities Noted: No Callus: No Atrophie Blanche: No Crepitus: No Cyanosis: No Excoriation: No Ecchymosis: No Induration: No Erythema: No Rash: No Hemosiderin Staining: No Scarring: No Mottled: No Pallor: No Moisture Rubor: No No Abnormalities Noted: No Dry / Scaly: No Temperature / Pain Maceration: Yes Temperature: No Abnormality Wound Preparation Ulcer Cleansing: Rinsed/Irrigated with Saline Topical Anesthetic Applied: Other: lidocaine 4%, Treatment Notes Wound #9 (Right Toe Third) 1. Cleansed with: Clean wound with Normal Saline 2. Anesthetic Topical Lidocaine 4% cream to wound bed prior to debridement 4. Dressing Applied: Other dressing (specify in notes) 5. Secondary Dressing Applied Dry Gauze Kerlix/Conform 7. Secured with Tape Notes silvercel, netting Electronic Signature(s) Signed: 05/02/2017 3:59:18 PM By: Montey Hora Entered By: Montey Hora on 05/02/2017 12:50:20 Vicencio, [name]  ([id number]) -------------------------------------------------------------------------------- Vitals Details Patient Name: Mcelhinny, Raimundo E. Date of Service: 05/02/2017 12:30 PM Medical Record Number: [id number] Patient Account Number: [id number] Date of Birth/Sex: Jul 04, 1950 (67 y.o. Male) Treating RN: Montey Hora Primary Care Jahnessa Vanduyn: Myrtie Hawk Other Clinician: Referring Charisma Charlot: Myrtie Hawk Treating Samul Mcinroy/Extender: Cathie Olden in Treatment: 31 Vital Signs Time Taken: 12:48 Temperature (F): 98.0 Height (in): 69 Pulse (bpm): 66 Weight (lbs): 168 Respiratory Rate (breaths/min): 16 Body Mass Index (BMI): 24.8 Blood Pressure (mmHg): 164/77 Reference Range: 80 - 120 mg / dl Electronic Signature(s) Signed: 05/02/2017 3:59:18 PM By: Montey Hora Entered By: Montey Hora on 05/02/2017 12:50:41

## 2017-05-03 NOTE — Progress Notes (Signed)
BOMANI, OOMMEN ([id number]) Visit Report for 05/02/2017 Chief Complaint Document Details Patient Name: [name], [name] Date of Service: 05/02/2017 12:30 PM Medical Record Number: [id number] Patient Account Number: [id number] Date of Birth/Sex: [birth year] (67 y.o. Male) Treating RN: Montey Hora Primary Care Provider: Myrtie Hawk Other Clinician: Referring Provider: Myrtie Hawk Treating Provider/Extender: Cathie Olden in Treatment: 31 Information Obtained from: Patient Chief Complaint Patient presents for treatment of an open diabetic ulcer to both feet Electronic Signature(s) Signed: 05/02/2017 4:11:02 PM By: Lawanda Cousins Entered By: Lawanda Cousins on 05/02/2017 13:05:25 Tegeler, [name] ([id number]) -------------------------------------------------------------------------------- HPI Details Patient Name: [name], [name] Date of Service: 05/02/2017 12:30 PM Medical Record Number: [id number] Patient Account Number: [id number] Date of Birth/Sex: 11-12-1950 (67 y.o. Male) Treating RN: Montey Hora Primary Care Provider: Myrtie Hawk Other Clinician: Referring Provider: Myrtie Hawk Treating Provider/Extender: Cathie Olden in Treatment: 31 History of Present Illness Location: bilateral feet ulceration on the toes Quality: Patient reports experiencing a dull pain to affected area(s). Severity: Patient states wound are getting better Duration: Patient has had the wound for > 3 months prior to seeking treatment at the wound center Timing: Pain in wound is constant (hurts all the time) Context: The wound would happen gradually Modifying Factors: Other treatment(s) tried include:treatment for lymphedema and is seen by the podiatrist Dr. Caryl Comes Associated Signs and Symptoms: Patient reports having increase swelling. HPI Description: 67 year old patient here to see as for bilateral feet ulceration to on his left first and second toe and 2  on his right first and second toe, which she's had for about 4 months. 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 Donella Stade. The patient is also undergoing workup by medical oncology for a intra-abdominal lymphadenopathy. 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 He is also status post appendectomy, inguinal lymph node biopsy, rectal biopsy and rectal examination under anesthesia. he currently smokes cigarettes about half packet a day. In March of this year he was seen by Dr. Hortencia Pilar, for evaluation of bilateral varicose veins and besides wearing compression stockings he had recommended laser ablation of the right and left great saphenous veins to eleviate the symptoms and complications of severe superficial venous reflux disease. He also recommended lymphedema pumps for better control of his lymphedema. The patient recently has had on 08/23/2016, right greater saphenous vein ablation with the laser energy Earlier lower extremity venous reflux examination done on 05/08/2016 showed no DVT or SVT both lower legs but incompetence of bilateral great saphenous veins was present. A lower arterial study was also done and there was no significant right lower and left lower extremity problems based on a normal toe brachial index bilaterally and the ABI was 1.21 the left and 1.23 on the right. His post ablation venous duplex examination showed successful ablation of the right GS vein with thrombus formation 2 below the right saphenofemoral junction. The deep system was patent without evidence of thrombosis and this was done on 08/30/2016. the patient also has a squamous cell cancer of the skin of the buttock and is recently undergone radiation therapy  for this prior to excisional surgery. Addendum: regarding his x-rays done today and x-ray of the left foot -- IMPRESSION: No objective evidence of osteomyelitis. There are soft tissue changes which  may reflect cellulitis. X-ray of the right foot -- IMPRESSION:Findings compatible with cellulitis of the toes. No objective evidence of osteomyelitis is observed. 10/01/16 on evaluation today patient's wounds appeared to be doing some better. I did review the x-rays as well which showed no evidence of osteomyelitis although there was evidence on x-ray of cellulitis. He fortunately is not having any discomfort although he continues to have some swelling. He does not remember being on any antibiotics recently. 10/15/16 on evaluation today patient's wounds overall appear to be doing better although he does have a new location noted on the left foot. Fortunately he is not having significant pain. It almost has the appearance that something is rubbing on the end of his toes but he wears the open toe shoes and according to what he is telling me never wears anything that would rub on his foot. There is no evidence of infection and specifically no evidence of a fungal infection 10/22/16 On evaluation today patient's wounds appeared to be doing better compared to last week in regard to his bilateral [name], [name] ([id number]) lower extremities. Fortunately I happy with how things are progressing although he still has ulcers I feel like that he is improving and appropriate manner. 11/12/16 on evaluation today patient appears to be doing well in regard to his bilateral feet and the respective wounds. We have been using surrounding her dressings along with an antifungal cream which seems to be doing very well. He has no bilateral dysfunction noticed that the rituals are weight loss at this point. He also has no nausea or vomiting a note purulent discharge. He did see Vein and vascular today and he tells me that they  told him he could have surgery for his venous stasis but they did not feel like it was worth it in his words. Fortunately patient's wounds do appear to be getting sneakily better. 11/26/2016 -- he says he is going to have some surgery during this week at [facility] for possibly a colon resection. 12/31/2016 -- the patient has been noncompliant with his smoking and I'm not sure whether he is also started drinking again. He continues to be very nonchalant about his care 01/14/2017 -- the patient's HandP has been reviewed well and I understand he is being compliant with trying to give up smoking and his local dressing changes. He does not have any surgical options of 4 to him by his vascular surgeons.he was last seen in early August by Dr. Hortencia Pilar who recommended compression stockings,and possibly lymph pumps in 2-3 months after doing a review ultrasound. 01/28/2017 - the patient did not have any fresh complaints but on examination I noted a large lacerated wound on the plantar aspect of his right fourth toe which had a lot of necrotic debris and it probes down to bone. 02/07/2017 -- x-ray of the right foot -- IMPRESSION: Soft tissue swelling about the first through fourth toes consistent with cellulitis. New destructive change in the tuft of the distal phalanx of the great toe is consistent with osteomyelitis. 02/14/2017 -- the patient's MRI is pending this coming Monday and he still continues to smoke. We have again gone over off loading of his wounds in great detail and he says he's been compliant. 02/21/2017 -- MR of the right foot -- IMPRESSION: 1. Soft tissue ulcer at the tip of the first, second and third toe knows. Cortical irregularity and bone marrow edema in the first distal phalanx most concerning for osteomyelitis. Mild marrow edema  in the second and third distal phalanx without definite cortical destruction which may reflect early osteomyelitis versus reactive marrow  edema. 2. Soft tissue edema surrounding the first phalanx most consistent with cellulitis. the patient was also recently evaluated by his medical oncologist Dr. 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 She is treating him with weekly Procrit. She is also keeping intra-abdominal lymphadenopathy and right lower lobe lung nodule under observation. 04/04/2017 -- he was seen by Dr. Adrian Prows on 03/25/2017 -- after review he empirically put him on ciprofloxacin and doxycycline as they have good bone penetration and good bioavailability and it will cover the usual pathogens and diabetic foot osteomyelitis. He will check inflammatory markers and plan a 22-87 week old records. C-reactive protein was 0.3 and the ESR was 72 04/18/17 on evaluation today patient appears to be doing about the same in regard to his lower extremity wounds bilaterally. He has continued to use the antifungal cream which does seem to be beneficial. Nonetheless the ulcers do seem to in some areas be epithelial eyes over and in other areas are still open. He is having no significant discomfort. 04/25/17-he is here in follow-up evaluation for multiple ulcerations to multiple toes bilaterally. He states he did see Dr. Ola Spurr again last week and continues antibiotic therapy. He is voicing no complaints or concerns, will continue with current treatment plan will possibility of adding compression therapy next week after an additional week of treatment/lotions to BLE prescribed by Dr Ola Spurr 05/02/17 he is here in follow up for for multiple ulcers to multiple toes bilaterally. we will stop using antifungal cream and will continue with silvercel and follow up next week Electronic Signature(s) Signed: 05/02/2017 4:11:02 PM By: Lawanda Cousins Entered By: Lawanda Cousins on 05/02/2017 13:16:55 [facility], [name]  ([id number]) -------------------------------------------------------------------------------- Physician Orders Details Patient Name: Prothero, Abdulai E. Date of Service: 05/02/2017 12:30 PM Medical Record Number: [id number] Patient Account Number: [id number] Date of Birth/Sex: 07-22-1950 (67 y.o. Male) Treating RN: Montey Hora Primary Care Provider: Myrtie Hawk Other Clinician: Referring Provider: Myrtie Hawk Treating Provider/Extender: Cathie Olden in Treatment: 31 Verbal / Phone Orders: No Diagnosis Coding Wound Cleansing Wound #1 Right Toe Great o Clean wound with Normal Saline. o May Shower, gently pat wound dry prior to applying new dressing. Wound #2 Right Toe Second o Clean wound with Normal Saline. o May Shower, gently pat wound dry prior to applying new dressing. Wound #5 Left Toe Great o Clean wound with Normal Saline. o May Shower, gently pat wound dry prior to applying new dressing. Wound #6 Left Toe Second o Clean wound with Normal Saline. o May Shower, gently pat wound dry prior to applying new dressing. Wound #9 Right Toe Third o Clean wound with Normal Saline. o May Shower, gently pat wound dry prior to applying new dressing. Primary Wound Dressing Wound #1 Right Toe Great o Silvercel Non-Adherent Wound #2 Right Toe Second o Silvercel Non-Adherent Wound #5 Left Toe Great o Silvercel Non-Adherent Wound #6 Left Toe Second o Silvercel Non-Adherent Wound #9 Right Toe Third o Silvercel Non-Adherent Secondary Dressing Wound #1 Right Toe Great o ABD and Kerlix/Conform Wound #2 Right Toe Second o ABD and Kerlix/Conform Wound #5 Left Toe Great [place], [place]ce[place] E. ([id number]) o ABD and Kerlix/Conform Wound #6 Left Toe Second o ABD and Kerlix/Conform Wound #9 Right Toe Third o ABD and Kerlix/Conform Dressing Change Frequency Wound #1 Right Toe Great o Change  dressing every day. Wound #2 Right  Toe Second o Change dressing every day. Wound #5 Left Toe Great o Change dressing every day. Wound #6 Left Toe Second o Change dressing every day. Wound #9 Right Toe Third o Change dressing every day. Follow-up Appointments Wound #1 Right Toe Great o Return Appointment in 1 week. Wound #2 Right Toe Second o Return Appointment in 1 week. Wound #5 Left Toe Great o Return Appointment in 1 week. Wound #6 Left Toe Second o Return Appointment in 1 week. Wound #9 Right Toe Third o Return Appointment in 1 week. Additional Orders / Instructions Wound #1 Right Toe Great o Stop Smoking o Increase protein intake. o Other: - Please add vitamin A, vitamin C and zinc supplements to your diet Wound #2 Right Toe Second o Stop Smoking o Increase protein intake. o Other: - Please add vitamin A, vitamin C and zinc supplements to your diet Wound #5 Left Toe Great o Stop Smoking o Increase protein intake. o Other: - Please add vitamin A, vitamin C and zinc supplements to your diet [place], Arkin E. ([id number]) Wound #6 Left Toe Second o Stop Smoking o Increase protein intake. o Other: - Please add vitamin A, vitamin C and zinc supplements to your diet Wound #9 Right Toe Third o Stop Smoking o Increase protein intake. o Other: - Please add vitamin A, vitamin C and zinc supplements to your diet Electronic Signature(s) Signed: 05/02/2017 4:11:02 PM By: Lawanda Cousins Entered By: Lawanda Cousins on 05/02/2017 13:29:47 Somoza, [name] ([id number]) -------------------------------------------------------------------------------- Problem List Details Patient Name: Mohammed, [name] Date of Service: 05/02/2017 12:30 PM Medical Record Number: [id number] Patient Account Number: [id number] Date of Birth/Sex: [birth year] (67 y.o. Male) Treating RN: Montey Hora Primary Care Provider: Myrtie Hawk Other Clinician: Referring Provider: Myrtie Hawk Treating Provider/Extender: Cathie Olden in Treatment: 31 Active Problems ICD-10 Encounter Code Description Active Date Diagnosis E11.621 Type 2 diabetes mellitus with foot ulcer 09/21/2016 Yes I87.313 Chronic venous hypertension (idiopathic) with ulcer of bilateral 09/21/2016 Yes lower extremity I89.0 Lymphedema, not elsewhere classified 09/21/2016 Yes L97.522 Non-pressure chronic ulcer of other part of left foot with fat layer 09/21/2016 Yes exposed L97.512 Non-pressure chronic ulcer of other part of right foot with fat layer 09/21/2016 Yes exposed F17.218 Nicotine dependence, cigarettes, with other nicotine-induced 09/21/2016 Yes disorders F10.19 Alcohol abuse with unspecified alcohol-induced disorder 09/21/2016 Yes M86.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, right ankle and foot 02/21/2017 Yes Inactive Problems Resolved Problems Electronic Signature(s) Signed: 05/02/2017 4:11:02 PM By: Lawanda Cousins Entered By: Lawanda Cousins on 05/02/2017 13:05:06 Hastings, [name] ([id number]) -------------------------------------------------------------------------------- Progress Note Details Patient Name: Corzine, Melissa E. Date of Service: 05/02/2017 12:30 PM Medical Record Number: [id number] Patient Account Number: [id number] Date of Birth/Sex: 17-Nov-1950 (67 y.o. Male) Treating RN: Montey Hora Primary Care Provider: Myrtie Hawk Other Clinician: Referring Provider: Myrtie Hawk Treating Provider/Extender: Cathie Olden in Treatment: 31 Subjective Chief Complaint Information obtained from Patient Patient presents for treatment of an open diabetic ulcer to both feet History of Present Illness (HPI) The following HPI elements were documented for the patient's wound: Location: bilateral feet ulceration on the toes Quality: Patient reports experiencing a dull pain to affected area(s). Severity: Patient states wound are getting better Duration: Patient has had  the wound for > 3 months prior to seeking treatment at the wound center Timing: Pain in wound is constant (hurts all the time) Context: The wound would happen gradually Modifying Factors: Other treatment(s) tried include:treatment for lymphedema and is seen by  the podiatrist Dr. Caryl Comes Associated Signs and Symptoms: Patient reports having increase swelling. 67 year old patient here to see as for bilateral feet ulceration to on his left first and second toe and 2 on his right first and second toe, which she's had for about 4 months. 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 Donella Stade. The patient is also undergoing workup by medical oncology for a intra-abdominal lymphadenopathy. 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 He is also status post appendectomy, inguinal lymph node biopsy, rectal biopsy and rectal examination under anesthesia. he currently smokes cigarettes about half packet a day. In March of this year he was seen by Dr. Hortencia Pilar, for evaluation of bilateral varicose veins and besides wearing compression stockings he had recommended laser ablation of the right and left great saphenous veins to eleviate the symptoms and complications of severe superficial venous reflux disease. He also recommended lymphedema pumps for better control of his lymphedema. The patient recently has had on 08/23/2016, right greater saphenous vein ablation with the laser energy Earlier lower extremity venous reflux examination done on 05/08/2016 showed no DVT or SVT both lower legs but incompetence of bilateral great saphenous veins was present. A lower arterial study was also done and there was no significant right lower and left lower extremity problems  based on a normal toe brachial index bilaterally and the ABI was 1.21 the left and 1.23 on the right. His post ablation venous duplex examination showed successful ablation of the right GS vein with thrombus formation 2 below the right saphenofemoral junction. The deep system was patent without evidence of thrombosis and this was done on 08/30/2016. 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 Addendum: regarding his x-rays done today and x-ray of the left foot -- IMPRESSION: No objective evidence of osteomyelitis. There are soft tissue changes which may reflect cellulitis. X-ray of the right foot -- IMPRESSION:Findings compatible with cellulitis of the toes. No objective evidence of osteomyelitis is observed. 10/01/16 on evaluation today patient's wounds appeared to be doing some better. I did review the x-rays as well which showed no evidence of osteomyelitis although there was evidence on x-ray of cellulitis. He fortunately is not having any discomfort [place]ord, Hays E. ([id number]) although he continues to have some swelling. He does not remember being on any antibiotics recently. 10/15/16 on evaluation today patient's wounds overall appear to be doing better although he does have a new location noted on the left foot. Fortunately he is not having significant pain. It almost has the appearance that something is rubbing on the end of his toes but he wears the open toe shoes and according to what he is telling me never wears anything that would rub on his foot. There is no evidence of infection and specifically no evidence of a fungal infection 10/22/16 On evaluation today patient's wounds appeared to be doing better compared to last week in regard to his bilateral lower extremities. Fortunately I happy with how things are progressing although he still has ulcers I feel like that he  is improving and appropriate manner. 11/12/16 on evaluation today patient appears to be doing well in regard to his bilateral  feet and the respective wounds. We have been using surrounding her dressings along with an antifungal cream which seems to be doing very well. He has no bilateral dysfunction noticed that the rituals are weight loss at this point. He also has no nausea or vomiting a note purulent discharge. He did see Vein and vascular today and he tells me that they told him he could have surgery for his venous stasis but they did not feel like it was worth it in his words. Fortunately patient's wounds do appear to be getting sneakily better. 11/26/2016 -- he says he is going to have some surgery during this week at [facility] for possibly a colon resection. 12/31/2016 -- the patient has been noncompliant with his smoking and I'm not sure whether he is also started drinking again. He continues to be very nonchalant about his care 01/14/2017 -- the patient's HandP has been reviewed well and I understand he is being compliant with trying to give up smoking and his local dressing changes. He does not have any surgical options of 4 to him by his vascular surgeons.he was last seen in early August by Dr. Hortencia Pilar who recommended compression stockings,and possibly lymph pumps in 2-3 months after doing a review ultrasound. 01/28/2017 - the patient did not have any fresh complaints but on examination I noted a large lacerated wound on the plantar aspect of his right fourth toe which had a lot of necrotic debris and it probes down to bone. 02/07/2017 -- x-ray of the right foot -- IMPRESSION: Soft tissue swelling about the first through fourth toes consistent with cellulitis. New destructive change in the tuft of the distal phalanx of the great toe is consistent with osteomyelitis. 02/14/2017 -- the patient's MRI is pending this coming Monday and he still continues to smoke. We have again gone  over off loading of his wounds in great detail and he says he's been compliant. 02/21/2017 -- MR of the right foot -- IMPRESSION: 1. Soft tissue ulcer at the tip of the first, second and third toe knows. Cortical irregularity and bone marrow edema in the first distal phalanx most concerning for osteomyelitis. Mild marrow edema in the second and third distal phalanx without definite cortical destruction which may reflect early osteomyelitis versus reactive marrow edema. 2. Soft tissue edema surrounding the first phalanx most consistent with cellulitis. the patient was also recently evaluated by his medical oncologist Dr. 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 She is treating him with weekly Procrit. She is also keeping intra-abdominal lymphadenopathy and right lower lobe lung nodule under observation. 04/04/2017 -- he was seen by Dr. Adrian Prows on 03/25/2017 -- after review he empirically put him on ciprofloxacin and doxycycline as they have good bone penetration and good bioavailability and it will cover the usual pathogens and diabetic foot osteomyelitis. He will check inflammatory markers and plan a 7-100 week old records. C-reactive protein was 0.3 and the ESR was 72 04/18/17 on evaluation today patient appears to be doing about the same in regard to his lower extremity wounds bilaterally. He has continued to use the antifungal cream which does seem to be beneficial. Nonetheless the ulcers do seem to in some areas be epithelial eyes over and in other areas are still open. He is having no significant discomfort. 04/25/17-he is here in follow-up evaluation for multiple ulcerations to multiple toes bilaterally. He states he did see Dr. Ola Spurr again  last week and continues antibiotic therapy. He is voicing no complaints or concerns, will continue with current treatment plan will possibility of adding compression  therapy next week after an additional week of treatment/lotions to BLE prescribed by Dr Ola Spurr 05/02/17 he is here in follow up for for multiple ulcers to multiple toes bilaterally. we will stop using antifungal cream and will continue with silvercel and follow up next week Skillin, Eytan E. ([id number]) Patient History Information obtained from Patient. Social History Current every day smoker, Marital Status - Widowed, Alcohol Use - Daily - quit drinking about a week ago, Drug Use - No History, Caffeine Use - Moderate. Medical And Surgical History Notes Oncologic 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 Objective Constitutional Vitals Time Taken: 12:48 PM, Height: 69 in, Weight: 168 lbs, BMI: 24.8, Temperature: 98.0 F, Pulse: 66 bpm, Respiratory Rate: 16 breaths/min, Blood Pressure: 164/77 mmHg. Integumentary (Hair, Skin) Wound #1 status is Open. Original cause of wound was Gradually Appeared. The wound is located on the Right Toe Great. The wound measures 0.8cm length x 5.4cm width x 0.1cm depth; 3.393cm^2 area and 0.339cm^3 volume. There is Fat Layer (Subcutaneous Tissue) Exposed exposed. There is no tunneling or undermining noted. There is a large amount of serous drainage noted. Foul odor after cleansing was noted. The wound margin is flat and intact. There is medium (34-66%) red granulation within the wound bed. There is a small (1-33%) amount of necrotic tissue within the wound bed including Adherent Slough. The periwound skin appearance exhibited: Excoriation, Maceration. The periwound skin appearance did not exhibit: Callus, Crepitus, Induration, Rash, Scarring, Dry/Scaly, Atrophie Blanche, Cyanosis, Ecchymosis, Hemosiderin Staining, Mottled, Pallor, Rubor, Erythema. Periwound temperature was noted as No Abnormality. Wound #2 status is Open. Original cause of wound was Gradually Appeared. The wound is located on the Right Toe Second. The wound measures  1.3cm length x 1.9cm width x 0.1cm depth; 1.94cm^2 area and 0.194cm^3 volume. There is Fat Layer (Subcutaneous Tissue) Exposed exposed. There is no tunneling or undermining noted. There is a large amount of serous drainage noted. Foul odor after cleansing was noted. The wound margin is flat and intact. There is medium (34-66%) red granulation within the wound bed. There is a small (1-33%) amount of necrotic tissue within the wound bed including Adherent Slough. The periwound skin appearance exhibited: Maceration. The periwound skin appearance did not exhibit: Callus, Crepitus, Excoriation, Induration, Rash, Scarring, Dry/Scaly, Atrophie Blanche, Cyanosis, Ecchymosis, Hemosiderin Staining, Mottled, Pallor, Rubor, Erythema. Periwound temperature was noted as No Abnormality. Wound #5 status is Open. Original cause of wound was Gradually Appeared. The wound is located on the Left Toe Great. The wound measures 1.5cm length x 1.8cm width x 0.1cm depth; 2.121cm^2 area and 0.212cm^3 volume. There is Fat Layer (Subcutaneous Tissue) Exposed exposed. There is no tunneling or undermining noted. There is a large amount of serous drainage noted. Foul odor after cleansing was noted. The wound margin is flat and intact. There is medium (34-66%) red granulation within the wound bed. There is a small (1-33%) amount of necrotic tissue within the wound bed including Adherent Slough. The periwound skin appearance exhibited: Maceration. The periwound skin appearance did not exhibit: Callus, Crepitus, Excoriation, Induration, Rash, Scarring, Dry/Scaly, Atrophie Blanche, Cyanosis, Ecchymosis, Hemosiderin Staining, Mottled, Pallor, Rubor, Erythema. Periwound temperature was noted as No Abnormality. Wound #6 status is Open. Original cause of wound was Gradually Appeared. The wound is located on the Left Toe Second. BERL, BONFANTI. ([id number]) The wound measures 1cm  length x 1.3cm width x 0.1cm depth; 1.021cm^2 area and  0.102cm^3 volume. There is Fat Layer (Subcutaneous Tissue) Exposed exposed. There is no tunneling or undermining noted. There is a medium amount of serous drainage noted. Foul odor after cleansing was noted. The wound margin is flat and intact. There is medium (34-66%) red granulation within the wound bed. There is a small (1-33%) amount of necrotic tissue within the wound bed including Adherent Slough. The periwound skin appearance exhibited: Maceration. The periwound skin appearance did not exhibit: Callus, Crepitus, Excoriation, Induration, Rash, Scarring, Dry/Scaly, Atrophie Blanche, Cyanosis, Ecchymosis, Hemosiderin Staining, Mottled, Pallor, Rubor, Erythema. Periwound temperature was noted as No Abnormality. Wound #9 status is Open. Original cause of wound was Gradually Appeared. The wound is located on the Right Toe Third. The wound measures 0.8cm length x 0.7cm width x 0.1cm depth; 0.44cm^2 area and 0.044cm^3 volume. There is Fat Layer (Subcutaneous Tissue) Exposed exposed. There is no tunneling or undermining noted. There is a large amount of serous drainage noted. Foul odor after cleansing was noted. The wound margin is flat and intact. There is medium (34-66%) red granulation within the wound bed. There is a small (1-33%) amount of necrotic tissue within the wound bed including Adherent Slough. The periwound skin appearance exhibited: Maceration. The periwound skin appearance did not exhibit: Callus, Crepitus, Excoriation, Induration, Rash, Scarring, Dry/Scaly, Atrophie Blanche, Cyanosis, Ecchymosis, Hemosiderin Staining, Mottled, Pallor, Rubor, Erythema. Periwound temperature was noted as No Abnormality. 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 May Shower, gently pat wound dry prior to applying new dressing. Wound #2 Right Toe Second: Clean wound with Normal Saline. May Shower, gently pat wound dry prior to applying new dressing. Wound #5 Left Toe Great: Clean wound with Normal Saline. May Shower, gently pat wound dry prior to applying new dressing. Wound #6 Left Toe Second: Clean wound with Normal Saline. May Shower, gently pat wound dry prior to applying new dressing. Wound #9 Right Toe Third: Clean wound with Normal Saline. May Shower, gently pat wound dry prior to applying new dressing. Primary Wound Dressing: PARDEEP, PAUTZ ([id number]) Wound #1 Right Toe Great: Silvercel Non-Adherent Wound #2 Right Toe Second: Silvercel Non-Adherent Wound #5 Left Toe Great: Silvercel Non-Adherent Wound #6 Left Toe Second: Silvercel Non-Adherent Wound #9 Right Toe Third: Silvercel Non-Adherent Secondary Dressing: Wound #1 Right Toe Great: ABD and Kerlix/Conform Wound #2 Right Toe Second: ABD and Kerlix/Conform Wound #5 Left Toe Great: ABD and Kerlix/Conform Wound #6 Left Toe Second: ABD and Kerlix/Conform Wound #9 Right Toe Third: ABD and Kerlix/Conform Dressing Change Frequency: Wound #1 Right Toe Great: Change dressing every day. Wound #2 Right Toe Second: Change dressing every day. Wound #5 Left Toe Great: Change dressing every day. Wound #6 Left Toe Second: Change dressing every day. Wound #9 Right Toe Third: Change dressing every day. Follow-up Appointments: Wound #1 Right Toe Great: Return Appointment in 1  week. Wound #2 Right Toe Second: Return Appointment in 1  week. Wound #5 Left Toe Great: Return Appointment in 1 week. Wound #6 Left Toe Second: Return Appointment in 1 week. Wound #9 Right Toe Third: Return Appointment in 1 week. Additional Orders / Instructions: Wound #1 Right Toe Great: Stop Smoking Increase protein intake. Other: - Please add vitamin A, vitamin C and zinc supplements to your diet Wound #2 Right Toe Second: Stop Smoking Increase protein intake. Other: - Please add vitamin A, vitamin C and zinc supplements to your diet Wound #5 Left Toe Great: Stop Smoking Increase protein intake. Other: - Please add vitamin A, vitamin C and zinc supplements to your diet Wound #6 Left Toe Second: Stop Smoking Increase protein intake. Other: - Please add vitamin A, vitamin C and zinc supplements to your diet Hannold, Dai E. ([id number]) Wound #9 Right Toe Third: Stop Smoking Increase protein intake. Other: - Please add vitamin A, vitamin C and zinc supplements to your diet 1. discontinue tca cream 2. gauze wicking between toes 3. continue with silvercell to toes 4. follow up next week 5. continue cipro and doxycycline per ID Electronic Signature(s) Signed: 05/02/2017 4:11:02 PM By: Lawanda Cousins Entered By: Lawanda Cousins on 05/02/2017 13:30:41 Gosselin, [name] ([id number]) -------------------------------------------------------------------------------- ROS/PFSH Details Patient Name: Tufte, Jagdeep E. Date of Service: 05/02/2017 12:30 PM Medical Record Number: [id number] Patient Account Number: [id number] Date of Birth/Sex: 03/20/1951 (68 y.o. Male) Treating RN: Montey Hora Primary Care Provider: Myrtie Hawk Other Clinician: Referring Provider: Myrtie Hawk Treating Provider/Extender: Cathie Olden in Treatment: 31 Information Obtained From Patient Wound History Do you currently have one or more open woundso Yes How many open wounds do you  currently haveo 6 Approximately how long have you had your woundso 3 months How have you been treating your wound(s) until nowo ointment and bandage Has your wound(s) ever healed and then re-openedo No Have you had any lab work done in the past montho No Have you tested positive for an antibiotic resistant organism (MRSA, VRE)o No Have you tested positive for osteomyelitis (bone infection)o No Have you had any tests for circulation on your legso Yes Who ordered the testo PCP Where was the test doneo [facility] Medical History: Negative for: Cataracts; Glaucoma; Optic Neuritis Ear/Nose/Mouth/Throat Medical History: Negative for: Chronic sinus problems/congestion; Middle ear problems Hematologic/Lymphatic Medical History: Positive for: Anemia; Lymphedema Negative for: Hemophilia; Human Immunodeficiency Virus; Sickle Cell Disease Respiratory Medical History: Negative for: Aspiration; Asthma; Chronic Obstructive Pulmonary Disease (COPD); Pneumothorax; Sleep Apnea; Tuberculosis Cardiovascular Medical History: Positive for: Congestive Heart Failure; Hypertension; Peripheral Venous Disease Negative for: Angina; Arrhythmia; Coronary Artery Disease; Deep Vein Thrombosis; Hypotension; Myocardial Infarction; Peripheral Arterial Disease; Phlebitis; Vasculitis Gastrointestinal Medical History: Negative for: Cirrhosis ; Colitis; Crohnos; Hepatitis A; Hepatitis B; Hepatitis C Allender, Hai E. ([id number]) Endocrine Medical History: Positive for: Type II Diabetes Treated with: Oral agents Blood sugar tested every day: Yes Tested : QD Genitourinary Medical History: Negative for: End Stage Renal Disease Immunological Medical History: Negative for: Lupus Erythematosus; Raynaudos; Scleroderma Integumentary (Skin) Medical History: Negative for: History of Burn; History of pressure wounds Musculoskeletal Medical History: Negative for: Gout; Rheumatoid Arthritis; Osteoarthritis;  Osteomyelitis Neurologic Medical History: Positive for: Neuropathy Negative for: Dementia; Quadriplegia; Paraplegia; Seizure Disorder Oncologic Medical History: Past Medical History Notes: 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 Electronic Signature(s) Signed: 05/02/2017 3:59:18 PM By: Montey Hora Signed: 05/02/2017 4:11:02 PM By: Stanton Kidney ([id number]) Entered By: Lawanda Cousins on 05/02/2017 13:29:07 Terwilliger, [name] ([id number]) -------------------------------------------------------------------------------- SuperBill Details Patient Name: Besse, Jayvier E. Date of Service: 05/02/2017 Medical Record Number: [id number] Patient Account Number: [id number] Date of Birth/Sex: 09/13/1950 (67 y.o. Male) Treating RN: Montey Hora Primary Care Provider: Myrtie Hawk Other Clinician: Referring Provider: Myrtie Hawk Treating Provider/Extender: Cathie Olden in Treatment: 31 Diagnosis Coding ICD-10 Codes Code Description E11.621 Type 2 diabetes mellitus with foot ulcer I87.313 Chronic venous hypertension (idiopathic) with ulcer of bilateral lower extremity I89.0 Lymphedema, not elsewhere classified L97.522 Non-pressure chronic ulcer of other part of left foot with fat layer exposed L97.512 Non-pressure chronic ulcer of other part of right foot with  fat layer exposed F17.218 Nicotine dependence, cigarettes, 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 abuse 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, right ankle and foot Facility Procedures CPT4 Code: [id number] Description: 18867 - WOUND CARE VISIT-LEV 5 EST PT Modifier: Quantity: 1 Physician Procedures CPT4 Code: [id number] Description: 99213 - WC PHYS LEVEL 3 - EST PT ICD-10 Diagnosis Description E11.621 Type 2 diabetes mellitus with foot ulcer I89.0 Lymphedema, not elsewhere classified L97.512 Non-pressure chronic ulcer of other part of right foot with fat L97.522  Non-pressure chronic ulcer of other part of left foot with fat Modifier: layer exposed layer exposed Quantity: 1 Electronic Signature(s) Signed: 05/02/2017 4:11:02 PM By: Lawanda Cousins Entered By: Lawanda Cousins on 05/02/2017 13:31:00

## 2017-05-07 ENCOUNTER — Inpatient Hospital Stay: Payer: Medicare HMO

## 2017-05-07 VITALS — BP 164/88 | HR 64

## 2017-05-07 DIAGNOSIS — D509 Iron deficiency anemia, unspecified: Secondary | ICD-10-CM

## 2017-05-07 DIAGNOSIS — D638 Anemia in other chronic diseases classified elsewhere: Secondary | ICD-10-CM

## 2017-05-07 DIAGNOSIS — D649 Anemia, unspecified: Secondary | ICD-10-CM

## 2017-05-07 DIAGNOSIS — N189 Chronic kidney disease, unspecified: Secondary | ICD-10-CM

## 2017-05-07 DIAGNOSIS — Z862 Personal history of diseases of the blood and blood-forming organs and certain disorders involving the immune mechanism: Secondary | ICD-10-CM

## 2017-05-07 DIAGNOSIS — I130 Hypertensive heart and chronic kidney disease with heart failure and stage 1 through stage 4 chronic kidney disease, or unspecified chronic kidney disease: Secondary | ICD-10-CM | POA: Diagnosis not present

## 2017-05-07 LAB — COMPREHENSIVE METABOLIC PANEL
ALBUMIN: 2.7 g/dL — AB (ref 3.5–5.0)
ALK PHOS: 80 U/L (ref 38–126)
ALT: 29 U/L (ref 17–63)
AST: 46 U/L — ABNORMAL HIGH (ref 15–41)
Anion gap: 5 (ref 5–15)
BILIRUBIN TOTAL: 0.6 mg/dL (ref 0.3–1.2)
BUN: 29 mg/dL — ABNORMAL HIGH (ref 6–20)
CALCIUM: 8.1 mg/dL — AB (ref 8.9–10.3)
CO2: 21 mmol/L — ABNORMAL LOW (ref 22–32)
Chloride: 104 mmol/L (ref 101–111)
Creatinine, Ser: 1.92 mg/dL — ABNORMAL HIGH (ref 0.61–1.24)
GFR calc non Af Amer: 35 mL/min — ABNORMAL LOW (ref >60)
GFR, EST AFRICAN AMERICAN: 41 mL/min — AB (ref >60)
GLUCOSE: 109 mg/dL — AB (ref 65–99)
POTASSIUM: 4.5 mmol/L (ref 3.5–5.1)
SODIUM: 130 mmol/L — AB (ref 135–145)
TOTAL PROTEIN: 7.4 g/dL (ref 6.5–8.1)

## 2017-05-07 LAB — CBC WITH DIFFERENTIAL/PLATELET
BASOS ABS: 0.1 10*3/uL (ref 0–0.1)
BASOS PCT: 1 %
Eosinophils Absolute: 0.6 10*3/uL (ref 0–0.7)
Eosinophils Relative: 11 %
HEMATOCRIT: 27.7 % — AB (ref 40.0–52.0)
Hemoglobin: 8.6 g/dL — ABNORMAL LOW (ref 13.0–18.0)
LYMPHS PCT: 19 %
Lymphs Abs: 1 10*3/uL (ref 1.0–3.6)
MCH: 27.2 pg (ref 26.0–34.0)
MCHC: 31.1 g/dL — ABNORMAL LOW (ref 32.0–36.0)
MCV: 87.4 fL (ref 80.0–100.0)
MONO ABS: 0.7 10*3/uL (ref 0.2–1.0)
Monocytes Relative: 14 %
NEUTROS ABS: 2.9 10*3/uL (ref 1.4–6.5)
Neutrophils Relative %: 55 %
Platelets: 190 10*3/uL (ref 150–440)
RBC: 3.17 MIL/uL — AB (ref 4.40–5.90)
RDW: 14.9 % — ABNORMAL HIGH (ref 11.5–14.5)
WBC: 5.2 10*3/uL (ref 3.8–10.6)

## 2017-05-07 MED ORDER — EPOETIN ALFA 40000 UNIT/ML IJ SOLN
40000.0000 [IU] | INTRAMUSCULAR | Status: DC
  Administered 2017-05-07: 40000 [IU] via SUBCUTANEOUS
  Filled 2017-05-07: qty 1

## 2017-05-07 NOTE — Progress Notes (Signed)
MD approves patient to receive Procrit with bp 164/88.

## 2017-05-08 LAB — KAPPA/LAMBDA LIGHT CHAINS
KAPPA FREE LGHT CHN: 176.3 mg/L — AB (ref 3.3–19.4)
Kappa, lambda light chain ratio: 2.07 — ABNORMAL HIGH (ref 0.26–1.65)
LAMDA FREE LIGHT CHAINS: 85.3 mg/L — AB (ref 5.7–26.3)

## 2017-05-09 ENCOUNTER — Encounter: Payer: Medicare HMO | Admitting: Nurse Practitioner

## 2017-05-09 DIAGNOSIS — E11621 Type 2 diabetes mellitus with foot ulcer: Secondary | ICD-10-CM | POA: Diagnosis not present

## 2017-05-09 LAB — MULTIPLE MYELOMA PANEL, SERUM
ALPHA2 GLOB SERPL ELPH-MCNC: 0.7 g/dL (ref 0.4–1.0)
Albumin SerPl Elph-Mcnc: 2.9 g/dL (ref 2.9–4.4)
Albumin/Glob SerPl: 0.8 (ref 0.7–1.7)
Alpha 1: 0.2 g/dL (ref 0.0–0.4)
B-Globulin SerPl Elph-Mcnc: 0.9 g/dL (ref 0.7–1.3)
Gamma Glob SerPl Elph-Mcnc: 2.4 g/dL — ABNORMAL HIGH (ref 0.4–1.8)
Globulin, Total: 4.1 g/dL — ABNORMAL HIGH (ref 2.2–3.9)
IGG (IMMUNOGLOBIN G), SERUM: 2667 mg/dL — AB (ref 700–1600)
IGM (IMMUNOGLOBULIN M), SRM: 97 mg/dL (ref 20–172)
IgA: 279 mg/dL (ref 61–437)
M Protein SerPl Elph-Mcnc: 0.9 g/dL — ABNORMAL HIGH
TOTAL PROTEIN ELP: 7 g/dL (ref 6.0–8.5)

## 2017-05-12 NOTE — Progress Notes (Signed)
NICHLOS, KUNZLER ([id number]) Visit Report for 05/09/2017 Arrival Information Details Patient Name: [name], [name] Date of Service: 05/09/2017 8:45 AM Medical Record Number: [id number] Patient Account Number: [id number] Date of Birth/Sex: [birth year] (67 y.o. Male) Treating RN: Ahmed Prima Primary Care Jawan Chavarria: Myrtie Hawk Other Clinician: Referring Leaf [employer]: Myrtie Hawk Treating Karmah Potocki/Extender: Cathie Olden in Treatment: 54 Visit Information History Since Last Visit All ordered tests and consults were completed: No Patient Arrived: Ambulatory Added or deleted any medications: No Arrival Time: 08:40 Any new allergies or adverse reactions: No Accompanied By: family Had a fall or experienced change in No Transfer Assistance: None activities of daily living that may affect Patient Identification Verified: Yes risk of falls: Secondary Verification Process Completed: Yes Signs or symptoms of abuse/neglect since last visito No Patient Requires Transmission-Based No Hospitalized since last visit: No Precautions: Has Dressing in Place as Prescribed: Yes Patient Has Alerts: Yes Pain Present Now: No Patient Alerts: DMII Electronic Signature(s) Signed: 05/10/2017 4:17:47 PM By: Alric Quan Entered By: Alric Quan on 05/09/2017 08:41:49 Hatfield, Adger E. ([id number]) -------------------------------------------------------------------------------- Clinic Level of Care Assessment Details Patient Name: Shanholtzer, Kiam E. Date of Service: 05/09/2017 8:45 AM Medical Record Number: [id number] Patient Account Number: [id number] Date of Birth/Sex: 07-Dec-1950 (67 y.o. Male) Treating RN: Ahmed Prima Primary Care Vonne Mcdanel: Myrtie Hawk Other Clinician: Referring Carlen Rebuck: Myrtie Hawk Treating Deyona Soza/Extender: Cathie Olden in Treatment: 32 Clinic Level of Care Assessment Items TOOL 4 Quantity Score X - Use when only an EandM is  performed on FOLLOW-UP visit 1 0 ASSESSMENTS - Nursing Assessment / Reassessment X - Reassessment of Co-morbidities (includes updates in patient status) 1 10 X- 1 5 Reassessment of Adherence to Treatment Plan ASSESSMENTS - Wound and Skin Assessment / Reassessment []  - Simple Wound Assessment / Reassessment - one wound 0 X- 5 5 Complex Wound Assessment / Reassessment - multiple wounds []  - 0 Dermatologic / Skin Assessment (not related to wound area) ASSESSMENTS - Focused Assessment []  - Circumferential Edema Measurements - multi extremities 0 []  - 0 Nutritional Assessment / Counseling / Intervention []  - 0 Lower Extremity Assessment (monofilament, tuning fork, pulses) []  - 0 Peripheral Arterial Disease Assessment (using hand held doppler) ASSESSMENTS - Ostomy and/or Continence Assessment and Care []  - Incontinence Assessment and Management 0 []  - 0 Ostomy Care Assessment and Management (repouching, etc.) PROCESS - Coordination of Care X - Simple Patient / Family Education for ongoing care 1 15 []  - 0 Complex (extensive) Patient / Family Education for ongoing care []  - 0 Staff obtains Programmer, systems, Records, Test Results / Process Orders []  - 0 Staff telephones HHA, Nursing Homes / Clarify orders / etc []  - 0 Routine Transfer to another Facility (non-emergent condition) []  - 0 Routine Hospital Admission (non-emergent condition) []  - 0 New Admissions / Biomedical engineer / Ordering NPWT, Apligraf, etc. []  - 0 Emergency Hospital Admission (emergent condition) X- 1 10 Simple Discharge Coordination Lippert, Kyree E. ([id number]) []  - 0 Complex (extensive) Discharge Coordination PROCESS - Special Needs []  - Pediatric / Minor Patient Management 0 []  - 0 Isolation Patient Management []  - 0 Hearing / Language / Visual special needs []  - 0 Assessment of Community assistance (transportation, D/C planning, etc.) []  - 0 Additional assistance / Altered mentation []  - 0 Support  Surface(s) Assessment (bed, cushion, seat, etc.) INTERVENTIONS - Wound Cleansing / Measurement []  - Simple Wound Cleansing - one wound 0 X- 5 5 Complex Wound Cleansing - multiple wounds X- 1 5 Wound Imaging (photographs -  any number of wounds) []  - 0 Wound Tracing (instead of photographs) []  - 0 Simple Wound Measurement - one wound X- 5 5 Complex Wound Measurement - multiple wounds INTERVENTIONS - Wound Dressings []  - Small Wound Dressing one or multiple wounds 0 X- 2 15 Medium Wound Dressing one or multiple wounds []  - 0 Large Wound Dressing one or multiple wounds X- 1 5 Application of Medications - topical []  - 0 Application of Medications - injection INTERVENTIONS - Miscellaneous []  - External ear exam 0 []  - 0 Specimen Collection (cultures, biopsies, blood, body fluids, etc.) []  - 0 Specimen(s) / Culture(s) sent or taken to Lab for analysis []  - 0 Patient Transfer (multiple staff / Civil Service fast streamer / Similar devices) []  - 0 Simple Staple / Suture removal (25 or less) []  - 0 Complex Staple / Suture removal (26 or more) []  - 0 Hypo / Hyperglycemic Management (close monitor of Blood Glucose) []  - 0 Ankle / Brachial Index (ABI) - do not check if billed separately X- 1 5 Vital Signs Darwish, Oshae E. ([id number]) Has the patient been seen at the hospital within the last three years: Yes Total Score: 160 Level Of Care: New/Established - Level 5 Electronic Signature(s) Signed: 05/10/2017 4:17:47 PM By: Alric Quan Entered By: Alric Quan on 05/09/2017 12:40:10 Reep, [name] ([id number]) -------------------------------------------------------------------------------- Encounter Discharge Information Details Patient Name: Mule, [name] E. Date of Service: 05/09/2017 8:45 AM Medical Record Number: [id number] Patient Account Number: [id number] Date of Birth/Sex: [birth year] (67 y.o. Male) Treating RN: Ahmed Prima Primary Care Zulma Court: Myrtie Hawk Other  Clinician: Referring Adaliah Hiegel: Myrtie Hawk Treating Tarus Briski/Extender: Cathie Olden in Treatment: 80 Encounter Discharge Information Items Discharge Pain Level: 0 Discharge Condition: Stable Ambulatory Status: Ambulatory Discharge Destination: Home Transportation: Private Auto Accompanied By: family Schedule Follow-up Appointment: Yes Medication Reconciliation completed and No provided to Patient/Care Joshuwa Vecchio: Provided on Clinical Summary of Care: 05/09/2017 Form Type Recipient Paper Patient ED Electronic Signature(s) Signed: 05/10/2017 4:27:15 PM By: Ruthine Dose Entered By: Ruthine Dose on 05/09/2017 09:06:32 Othman, [name] ([id number]) -------------------------------------------------------------------------------- Lower Extremity Assessment Details Patient Name: Vreeland, Kendra E. Date of Service: 05/09/2017 8:45 AM Medical Record Number: [id number] Patient Account Number: [id number] Date of Birth/Sex: 04/16/1950 (67 y.o. Male) Treating RN: Ahmed Prima Primary Care Azula Zappia: Myrtie Hawk Other Clinician: Referring Maie Kesinger: Myrtie Hawk Treating Kerin Kren/Extender: Cathie Olden in Treatment: 32 Vascular Assessment Pulses: Dorsalis Pedis Palpable: [Left:Yes] [Right:Yes] Posterior Tibial Extremity colors, hair growth, and conditions: Extremity Color: [Left:Hyperpigmented] [Right:Hyperpigmented] Temperature of Extremity: [Left:Warm] [Right:Warm] Capillary Refill: [Left:< 3 seconds] [Right:< 3 seconds] Electronic Signature(s) Signed: 05/10/2017 4:17:47 PM By: Alric Quan Entered By: Alric Quan on 05/09/2017 08:52:01 Cowin, Shuan E. ([id number]) -------------------------------------------------------------------------------- Multi Wound Chart Details Patient Name: Kushnir, Arnoldo E. Date of Service: 05/09/2017 8:45 AM Medical Record Number: [id number] Patient Account Number: [id number] Date of Birth/Sex: Jan 28, 1951 (67  y.o. Male) Treating RN: Ahmed Prima Primary Care Halee Glynn: Myrtie Hawk Other Clinician: Referring Briseyda Fehr: Myrtie Hawk Treating Raygan Skarda/Extender: Cathie Olden in Treatment: 32 Vital Signs Height(in): 9 Pulse(bpm): 32 Weight(lbs): 168 Blood Pressure(mmHg): 168/78 Body Mass Index(BMI): 25 Temperature(F): 97.7 Respiratory Rate 16 (breaths/min): Photos: [1:No Photos] [2:No Photos] [5:No Photos] Wound Location: [1:Right Toe Great] [2:Right Toe Second] [5:Left Toe Great] Wounding Event: [1:Gradually Appeared] [2:Gradually Appeared] [5:Gradually Appeared] Primary Etiology: [1:Diabetic Wound/Ulcer of the Lower Extremity] [2:Diabetic Wound/Ulcer of the Lower Extremity] [5:Diabetic Wound/Ulcer of the Lower Extremity] Comorbid History: [1:Anemia, Lymphedema, Congestive Heart Failure, Hypertension, Peripheral Venous Disease, Type II Diabetes, Neuropathy] [2:Anemia, Lymphedema, Congestive Heart  Failure, Hypertension, Peripheral Venous Disease, Type II Diabetes,  Neuropathy] [5:Anemia, Lymphedema, Congestive Heart Failure, Hypertension, Peripheral Venous Disease, Type II Diabetes, Neuropathy] Date Acquired: [1:06/11/2016] [2:06/11/2016] [5:06/11/2016] Weeks of Treatment: [1:32] [2:32] [5:32] Wound Status: [1:Open] [2:Open] [5:Open] Pending Amputation on [1:Yes] [2:Yes] [5:Yes] Presentation: Measurements L x W x D [1:0.8x5.4x0.1] [2:0.6x1.8x0.1] [5:1x2x0.2] (cm) Area (cm) : [1:3.393] [2:0.848] [5:1.571] Volume (cm) : [1:0.339] [2:0.085] [5:0.314] % Reduction in Area: [1:92.80%] [2:72.40%] [5:89.00%] % Reduction in Volume: [1:92.80%] [2:72.30%] [5:78.00%] Classification: [1:Grade 2] [2:Grade 2] [5:Grade 2] Exudate Amount: [1:Large] [2:Large] [5:Large] Exudate Type: [1:Serous] [2:Serosanguineous] [5:Serosanguineous] Exudate Color: [1:amber] [2:red, brown] [5:red, brown] Foul Odor After Cleansing: [1:Yes] [2:Yes] [5:Yes] Odor Anticipated Due to [1:No] [2:No]  [5:No] Product Use: Wound Margin: [1:Flat and Intact] [2:Flat and Intact] [5:Flat and Intact] Granulation Amount: [1:Medium (34-66%)] [2:Large (67-100%)] [5:Medium (34-66%)] Granulation Quality: [1:Red] [2:Red] [5:Red] Necrotic Amount: [1:Small (1-33%)] [2:Small (1-33%)] [5:Small (1-33%)] Exposed Structures: [1:Fat Layer (Subcutaneous Tissue) Exposed: Yes Fascia: No Tendon: No Muscle: No] [2:Fat Layer (Subcutaneous Tissue) Exposed: Yes Fascia: No Tendon: No Muscle: No] [5:Fat Layer (Subcutaneous Tissue) Exposed: Yes Fascia: No Tendon: No Muscle: No] Joint: No Joint: No Joint: No Bone: No Bone: No Bone: No Epithelialization: None None None Periwound Skin Texture: Excoriation: Yes Excoriation: No Excoriation: No Induration: No Induration: No Induration: No Callus: No Callus: No Callus: No Crepitus: No Crepitus: No Crepitus: No Rash: No Rash: No Rash: No Scarring: No Scarring: No Scarring: No Periwound Skin Moisture: Maceration: Yes Maceration: Yes Maceration: Yes Dry/Scaly: No Dry/Scaly: No Dry/Scaly: No Periwound Skin Color: Atrophie Blanche: No Atrophie Blanche: No Atrophie Blanche: No Cyanosis: No Cyanosis: No Cyanosis: No Ecchymosis: No Ecchymosis: No Ecchymosis: No Erythema: No Erythema: No Erythema: No Hemosiderin Staining: No Hemosiderin Staining: No Hemosiderin Staining: No Mottled: No Mottled: No Mottled: No Pallor: No Pallor: No Pallor: No Rubor: No Rubor: No Rubor: No Temperature: No Abnormality No Abnormality No Abnormality Tenderness on Palpation: No No No Wound Preparation: Ulcer Cleansing: Ulcer Cleansing: Ulcer Cleansing: Rinsed/Irrigated with Saline Rinsed/Irrigated with Saline Rinsed/Irrigated with Saline Topical Anesthetic Applied: Topical Anesthetic Applied: Topical Anesthetic Applied: Other: lidocaine 4% Other: lidocaine 4% Other: lidocaine 4% Wound Number: 6 9 N/A Photos: No Photos No Photos N/A Wound Location: Left  Toe Second Right Toe Third N/A Wounding Event: Gradually Appeared Gradually Appeared N/A Primary Etiology: Diabetic Wound/Ulcer of the Diabetic Wound/Ulcer of the N/A Lower Extremity Lower Extremity Comorbid History: Anemia, Lymphedema, Anemia, Lymphedema, N/A Congestive Heart Failure, Congestive Heart Failure, Hypertension, Peripheral Hypertension, Peripheral Venous Disease, Type II Venous Disease, Type II Diabetes, Neuropathy Diabetes, Neuropathy Date Acquired: 06/11/2016 12/31/2016 N/A Weeks of Treatment: 32 18 N/A Wound Status: Open Open N/A Pending Amputation on Yes No N/A Presentation: Measurements L x W x D 1.2x1.3x0.1 0.7x0.3x0.1 N/A (cm) Area (cm) : 1.225 0.165 N/A Volume (cm) : 0.123 0.016 N/A % Reduction in Area: 42.60% 58.00% N/A % Reduction in Volume: 42.50% 59.00% N/A Classification: Grade 2 Grade 2 N/A Exudate Amount: Large Large N/A Exudate Type: Serous Serous N/A Exudate Color: amber amber N/A Foul Odor After Cleansing: Yes Yes N/A Odor Anticipated Due to No No N/A Product Use: Wound Margin: Flat and Intact Flat and Intact N/A Granulation Amount: Large (67-100%) Medium (34-66%) N/A Capaldi, Davinci E. ([id number]) Granulation Quality: Red Red N/A Necrotic Amount: Small (1-33%) Medium (34-66%) N/A Exposed Structures: Fat Layer (Subcutaneous Fat Layer (Subcutaneous N/A Tissue) Exposed: Yes Tissue) Exposed: Yes Fascia: No Fascia: No Tendon: No Tendon: No Muscle: No Muscle: No Joint: No Joint: No Bone:  No Bone: No Epithelialization: None None N/A Periwound Skin Texture: Excoriation: No Excoriation: No N/A Induration: No Induration: No Callus: No Callus: No Crepitus: No Crepitus: No Rash: No Rash: No Scarring: No Scarring: No Periwound Skin Moisture: Maceration: Yes Maceration: Yes N/A Dry/Scaly: No Dry/Scaly: No Periwound Skin Color: Atrophie Blanche: No Atrophie Blanche: No N/A Cyanosis: No Cyanosis: No Ecchymosis: No Ecchymosis:  No Erythema: No Erythema: No Hemosiderin Staining: No Hemosiderin Staining: No Mottled: No Mottled: No Pallor: No Pallor: No Rubor: No Rubor: No Temperature: No Abnormality No Abnormality N/A Tenderness on Palpation: No No N/A Wound Preparation: Ulcer Cleansing: Ulcer Cleansing: N/A Rinsed/Irrigated with Saline Rinsed/Irrigated with Saline Topical Anesthetic Applied: Topical Anesthetic Applied: Other: lidocaine 4% Other: lidocaine 4% Treatment Notes Wound #1 (Right Toe Great) 1. Cleansed with: Clean wound with Normal Saline 2. Anesthetic Topical Lidocaine 4% cream to wound bed prior to debridement 4. Dressing Applied: Other dressing (specify in notes) 5. Secondary Dressing Applied ABD Pad Kerlix/Conform 7. Secured with Tape Notes silvercel Wound #2 (Right Toe Second) 1. Cleansed with: Clean wound with Normal Saline 2. Anesthetic Topical Lidocaine 4% cream to wound bed prior to debridement Saar, Ewen E. ([id number]) 4. Dressing Applied: Other dressing (specify in notes) 5. Secondary Dressing Applied ABD Pad Kerlix/Conform 7. Secured with Tape Notes silvercel Wound #5 (Left Toe Great) 1. Cleansed with: Clean wound with Normal Saline 2. Anesthetic Topical Lidocaine 4% cream to wound bed prior to debridement 4. Dressing Applied: Other dressing (specify in notes) 5. Secondary Dressing Applied ABD Pad Kerlix/Conform 7. Secured with Tape Notes silvercel Wound #6 (Left Toe Second) 1. Cleansed with: Clean wound with Normal Saline 2. Anesthetic Topical Lidocaine 4% cream to wound bed prior to debridement 4. Dressing Applied: Other dressing (specify in notes) 5. Secondary Dressing Applied ABD Pad Kerlix/Conform 7. Secured with Tape Notes silvercel Wound #9 (Right Toe Third) 1. Cleansed with: Clean wound with Normal Saline 2. Anesthetic Topical Lidocaine 4% cream to wound bed prior to debridement 4. Dressing Applied: Other dressing (specify in  notes) 5. Secondary Dressing Applied ABD Pad Kerlix/Conform 7. Secured with Tape Coulston, GRANITE GODMAN ([id number]) Notes silvercel Electronic Signature(s) Signed: 05/09/2017 5:46:58 PM By: Lawanda Cousins Entered By: Lawanda Cousins on 05/09/2017 10:49:36 Shrider, [name] ([id number]) -------------------------------------------------------------------------------- [place] Details Patient Name: Shane, Friend E. Date of Service: 05/09/2017 8:45 AM Medical Record Number: [id number] Patient Account Number: [id number] Date of Birth/Sex: 1951/03/07 (67 y.o. Male) Treating RN: Ahmed Prima Primary Care Saavi Mceachron: Myrtie Hawk Other Clinician: Referring Mikail Goostree: Myrtie Hawk Treating Elonzo Sopp/Extender: Cathie Olden in Treatment: 32 Active Inactive ` Abuse / Safety / Falls / Self Care Management Nursing Diagnoses: Potential for falls Goals: Patient will remain injury free related to falls Date Initiated: 09/21/2016 Target Resolution Date: 11/30/2016 Goal Status: Active Interventions: Assess fall risk on admission and as needed Notes: ` Nutrition Nursing Diagnoses: Potential for alteratiion in Nutrition/Potential for imbalanced nutrition Goals: Patient/caregiver agrees to and verbalizes understanding of need to use nutritional supplements and/or vitamins as prescribed Date Initiated: 09/21/2016 Target Resolution Date: 11/30/2016 Goal Status: Active Interventions: Assess patient nutrition upon admission and as needed per policy Notes: ` Orientation to the Wound Care Program Nursing Diagnoses: Knowledge deficit related to the wound healing center program Goals: Patient/caregiver will verbalize understanding of the [facility] Date Initiated: 09/21/2016 Target Resolution Date: 11/30/2016 Goal Status: Active Interventions: DARKEfren, Kross ([id number]) Provide education on orientation to the wound center Notes: ` Wound/Skin  Impairment Nursing Diagnoses: Knowledge deficit related to  smoking impact on wound healing Goals: Ulcer/skin breakdown will have a volume reduction of 30% by week 4 Date Initiated: 09/21/2016 Target Resolution Date: 11/30/2016 Goal Status: Active Ulcer/skin breakdown will have a volume reduction of 50% by week 8 Date Initiated: 09/21/2016 Target Resolution Date: 11/30/2016 Goal Status: Active Ulcer/skin breakdown will have a volume reduction of 80% by week 12 Date Initiated: 09/21/2016 Target Resolution Date: 11/30/2016 Goal Status: Active Ulcer/skin breakdown will heal within 14 weeks Date Initiated: 09/21/2016 Target Resolution Date: 11/30/2016 Goal Status: Active Interventions: Assess patient/caregiver ability to obtain necessary supplies Assess patient/caregiver ability to perform ulcer/skin care regimen upon admission and as needed Assess ulceration(s) every visit Notes: Electronic Signature(s) Signed: 05/10/2017 4:17:47 PM By: Alric Quan Entered By: Alric Quan on 05/09/2017 08:55:28 Dede, Shahmeer E. ([id number]) -------------------------------------------------------------------------------- Pain Assessment Details Patient Name: Derringer, Masami E. Date of Service: 05/09/2017 8:45 AM Medical Record Number: [id number] Patient Account Number: [id number] Date of Birth/Sex: 1950/12/09 (67 y.o. Male) Treating RN: Ahmed Prima Primary Care Colson Barco: Myrtie Hawk Other Clinician: Referring Lonney Revak: Myrtie Hawk Treating Luddie Boghosian/Extender: Cathie Olden in Treatment: 32 Active Problems Location of Pain Severity and Description of Pain Patient Has Paino No Site Locations Pain Management and Medication Current Pain Management: Electronic Signature(s) Signed: 05/10/2017 4:17:47 PM By: Alric Quan Entered By: Alric Quan on 05/09/2017 08:41:54 Ortloff, [name]  ([id number]) -------------------------------------------------------------------------------- Patient/Caregiver Education Details Patient Name: Nicoll, Salvator E. Date of Service: 05/09/2017 8:45 AM Medical Record Number: [id number] Patient Account Number: [id number] Date of Birth/Gender: [birth year] (67 y.o. Male) Treating RN: Ahmed Prima Primary Care Physician: Myrtie Hawk Other Clinician: Referring Physician: Myrtie Hawk Treating Physician/Extender: Cathie Olden in Treatment: 98 Education Assessment Education Provided To: Patient Education Topics Provided Wound/Skin Impairment: Handouts: Caring for Your Ulcer, Other: change dressing as ordered Methods: Demonstration, Explain/Verbal Responses: State content correctly Electronic Signature(s) Signed: 05/10/2017 4:17:47 PM By: Alric Quan Entered By: Alric Quan on 05/09/2017 08:57:43 Wente, Micheal E. ([id number]) -------------------------------------------------------------------------------- Wound Assessment Details Patient Name: Laible, Stuart E. Date of Service: 05/09/2017 8:45 AM Medical Record Number: [id number] Patient Account Number: [id number] Date of Birth/Sex: 10/13/1950 (67 y.o. Male) Treating RN: Ahmed Prima Primary Care Jeronda Don: Myrtie Hawk Other Clinician: Referring Serenity Fortner: Myrtie Hawk Treating Denishia Citro/Extender: Cathie Olden in Treatment: 32 Wound Status Wound Number: 1 Primary Diabetic Wound/Ulcer of the Lower Extremity Etiology: Wound Location: Right Toe Great Wound Open Wounding Event: Gradually Appeared Status: Date Acquired: 06/11/2016 Comorbid Anemia, Lymphedema, Congestive Heart Weeks Of Treatment: 32 History: Failure, Hypertension, Peripheral Venous Clustered Wound: No Disease, Type II Diabetes, Neuropathy Pending Amputation On Presentation Photos Photo Uploaded By: Alric Quan on 05/09/2017 16:29:11 Wound Measurements Length:  (cm) 0.8 Width: (cm) 5.4 Depth: (cm) 0.1 Area: (cm) 3.393 Volume: (cm) 0.339 % Reduction in Area: 92.8% % Reduction in Volume: 92.8% Epithelialization: None Tunneling: No Undermining: No Wound Description Classification: Grade 2 Wound Margin: Flat and Intact Exudate Amount: Large Exudate Type: Serous Exudate Color: amber Foul Odor After Cleansing: Yes Due to Product Use: No Slough/Fibrino No Wound Bed Granulation Amount: Medium (34-66%) Exposed Structure Granulation Quality: Red Fascia Exposed: No Necrotic Amount: Small (1-33%) Fat Layer (Subcutaneous Tissue) Exposed: Yes Necrotic Quality: Adherent Slough Tendon Exposed: No Muscle Exposed: No Joint Exposed: No Bone Exposed: No Periwound Skin Texture Faso, Kysean E. ([id number]) Texture Color No Abnormalities Noted: No No Abnormalities Noted: No Callus: No Atrophie Blanche: No Crepitus: No Cyanosis: No Excoriation: Yes Ecchymosis: No Induration: No Erythema: No Rash: No Hemosiderin Staining: No Scarring: No Mottled: No Pallor: No Moisture  Rubor: No No Abnormalities Noted: No Dry / Scaly: No Temperature / Pain Maceration: Yes Temperature: No Abnormality Wound Preparation Ulcer Cleansing: Rinsed/Irrigated with Saline Topical Anesthetic Applied: Other: lidocaine 4%, Treatment Notes Wound #1 (Right Toe Great) 1. Cleansed with: Clean wound with Normal Saline 2. Anesthetic Topical Lidocaine 4% cream to wound bed prior to debridement 4. Dressing Applied: Other dressing (specify in notes) 5. Secondary Dressing Applied ABD Pad Kerlix/Conform 7. Secured with Tape Notes silvercel Electronic Signature(s) Signed: 05/10/2017 4:17:47 PM By: Alric Quan Entered By: Alric Quan on 05/09/2017 08:47:21 Yearwood, [name] ([id number]) -------------------------------------------------------------------------------- Wound Assessment Details Patient Name: Doro, Murel E. Date of Service: 05/09/2017 8:45  AM Medical Record Number: [id number] Patient Account Number: [id number] Date of Birth/Sex: [birth year] (67 y.o. Male) Treating RN: Ahmed Prima Primary Care Meet Weathington: Myrtie Hawk Other Clinician: Referring Kalik Hoare: Myrtie Hawk Treating Allia Wiltsey/Extender: Cathie Olden in Treatment: 32 Wound Status Wound Number: 2 Primary Diabetic Wound/Ulcer of the Lower Extremity Etiology: Wound Location: Right Toe Second Wound Open Wounding Event: Gradually Appeared Status: Date Acquired: 06/11/2016 Comorbid Anemia, Lymphedema, Congestive Heart Weeks Of Treatment: 32 History: Failure, Hypertension, Peripheral Venous Clustered Wound: No Disease, Type II Diabetes, Neuropathy Pending Amputation On Presentation Photos Photo Uploaded By: Alric Quan on 05/09/2017 16:29:42 Wound Measurements Length: (cm) 0.6 Width: (cm) 1.8 Depth: (cm) 0.1 Area: (cm) 0.848 Volume: (cm) 0.085 % Reduction in Area: 72.4% % Reduction in Volume: 72.3% Epithelialization: None Tunneling: No Undermining: No Wound Description Classification: Grade 2 Wound Margin: Flat and Intact Exudate Amount: Large Exudate Type: Serosanguineous Exudate Color: red, brown Foul Odor After Cleansing: Yes Due to Product Use: No Slough/Fibrino No Wound Bed Granulation Amount: Large (67-100%) Exposed Structure Granulation Quality: Red Fascia Exposed: No Necrotic Amount: Small (1-33%) Fat Layer (Subcutaneous Tissue) Exposed: Yes Necrotic Quality: Adherent Slough Tendon Exposed: No Muscle Exposed: No Joint Exposed: No Bone Exposed: No Periwound Skin Texture Mclaurin, Galo E. ([id number]) Texture Color No Abnormalities Noted: No No Abnormalities Noted: No Callus: No Atrophie Blanche: No Crepitus: No Cyanosis: No Excoriation: No Ecchymosis: No Induration: No Erythema: No Rash: No Hemosiderin Staining: No Scarring: No Mottled: No Pallor: No Moisture Rubor: No No Abnormalities Noted:  No Dry / Scaly: No Temperature / Pain Maceration: Yes Temperature: No Abnormality Wound Preparation Ulcer Cleansing: Rinsed/Irrigated with Saline Topical Anesthetic Applied: Other: lidocaine 4%, Treatment Notes Wound #2 (Right Toe Second) 1. Cleansed with: Clean wound with Normal Saline 2. Anesthetic Topical Lidocaine 4% cream to wound bed prior to debridement 4. Dressing Applied: Other dressing (specify in notes) 5. Secondary Dressing Applied ABD Pad Kerlix/Conform 7. Secured with Tape Notes silvercel Electronic Signature(s) Signed: 05/10/2017 4:17:47 PM By: Alric Quan Entered By: Alric Quan on 05/09/2017 08:48:06 Stoecker, [name] ([id number]) -------------------------------------------------------------------------------- Wound Assessment Details Patient Name: [place], Pernell E. Date of Service: 05/09/2017 8:45 AM Medical Record Number: [id number] Patient Account Number: [id number] Date of Birth/Sex: September 25, 1950 (67 y.o. Male) Treating RN: Ahmed Prima Primary Care Kendrick Remigio: Myrtie Hawk Other Clinician: Referring Chuong Casebeer: Myrtie Hawk Treating Mercy Leppla/Extender: Cathie Olden in Treatment: 32 Wound Status Wound Number: 5 Primary Diabetic Wound/Ulcer of the Lower Extremity Etiology: Wound Location: Left Toe Great Wound Open Wounding Event: Gradually Appeared Status: Date Acquired: 06/11/2016 Comorbid Anemia, Lymphedema, Congestive Heart Weeks Of Treatment: 32 History: Failure, Hypertension, Peripheral Venous Clustered Wound: No Disease, Type II Diabetes, Neuropathy Pending Amputation On Presentation Photos Photo Uploaded By: Alric Quan on 05/09/2017 16:29:43 Wound Measurements Length: (cm) 1 Width: (cm) 2 Depth: (cm) 0.2 Area: (cm) 1.571 Volume: (cm) 0.314 %  Reduction in Area: 89% % Reduction in Volume: 78% Epithelialization: None Tunneling: No Undermining: No Wound Description Classification: Grade 2 Wound  Margin: Flat and Intact Exudate Amount: Large Exudate Type: Serosanguineous Exudate Color: red, brown Foul Odor After Cleansing: Yes Due to Product Use: No Slough/Fibrino No Wound Bed Granulation Amount: Medium (34-66%) Exposed Structure Granulation Quality: Red Fascia Exposed: No Necrotic Amount: Small (1-33%) Fat Layer (Subcutaneous Tissue) Exposed: Yes Necrotic Quality: Adherent Slough Tendon Exposed: No Muscle Exposed: No Joint Exposed: No Bone Exposed: No Periwound Skin Texture Straus, Zameer E. ([id number]) Texture Color No Abnormalities Noted: No No Abnormalities Noted: No Callus: No Atrophie Blanche: No Crepitus: No Cyanosis: No Excoriation: No Ecchymosis: No Induration: No Erythema: No Rash: No Hemosiderin Staining: No Scarring: No Mottled: No Pallor: No Moisture Rubor: No No Abnormalities Noted: No Dry / Scaly: No Temperature / Pain Maceration: Yes Temperature: No Abnormality Wound Preparation Ulcer Cleansing: Rinsed/Irrigated with Saline Topical Anesthetic Applied: Other: lidocaine 4%, Treatment Notes Wound #5 (Left Toe Great) 1. Cleansed with: Clean wound with Normal Saline 2. Anesthetic Topical Lidocaine 4% cream to wound bed prior to debridement 4. Dressing Applied: Other dressing (specify in notes) 5. Secondary Dressing Applied ABD Pad Kerlix/Conform 7. Secured with Tape Notes silvercel Electronic Signature(s) Signed: 05/10/2017 4:17:47 PM By: Alric Quan Entered By: Alric Quan on 05/09/2017 08:48:57 Kawamoto, [name] ([id number]) -------------------------------------------------------------------------------- Wound Assessment Details Patient Name: Bogus, Flynn E. Date of Service: 05/09/2017 8:45 AM Medical Record Number: [id number] Patient Account Number: [id number] Date of Birth/Sex: 18-Feb-1951 (67 y.o. Male) Treating RN: Ahmed Prima Primary Care Teran Knittle: Myrtie Hawk Other Clinician: Referring Pebble Botkin:  Myrtie Hawk Treating Arien Morine/Extender: Cathie Olden in Treatment: 32 Wound Status Wound Number: 6 Primary Diabetic Wound/Ulcer of the Lower Extremity Etiology: Wound Location: Left Toe Second Wound Open Wounding Event: Gradually Appeared Status: Date Acquired: 06/11/2016 Comorbid Anemia, Lymphedema, Congestive Heart Weeks Of Treatment: 32 History: Failure, Hypertension, Peripheral Venous Clustered Wound: No Disease, Type II Diabetes, Neuropathy Pending Amputation On Presentation Photos Photo Uploaded By: Alric Quan on 05/09/2017 16:30:19 Wound Measurements Length: (cm) 1.2 Width: (cm) 1.3 Depth: (cm) 0.1 Area: (cm) 1.225 Volume: (cm) 0.123 % Reduction in Area: 42.6% % Reduction in Volume: 42.5% Epithelialization: None Tunneling: No Undermining: No Wound Description Classification: Grade 2 Wound Margin: Flat and Intact Exudate Amount: Large Exudate Type: Serous Exudate Color: amber Foul Odor After Cleansing: Yes Due to Product Use: No Slough/Fibrino No Wound Bed Granulation Amount: Large (67-100%) Exposed Structure Granulation Quality: Red Fascia Exposed: No Necrotic Amount: Small (1-33%) Fat Layer (Subcutaneous Tissue) Exposed: Yes Necrotic Quality: Adherent Slough Tendon Exposed: No Muscle Exposed: No Joint Exposed: No Bone Exposed: No Periwound Skin Texture Hilario, Knolan E. ([id number]) Texture Color No Abnormalities Noted: No No Abnormalities Noted: No Callus: No Atrophie Blanche: No Crepitus: No Cyanosis: No Excoriation: No Ecchymosis: No Induration: No Erythema: No Rash: No Hemosiderin Staining: No Scarring: No Mottled: No Pallor: No Moisture Rubor: No No Abnormalities Noted: No Dry / Scaly: No Temperature / Pain Maceration: Yes Temperature: No Abnormality Wound Preparation Ulcer Cleansing: Rinsed/Irrigated with Saline Topical Anesthetic Applied: Other: lidocaine 4%, Treatment Notes Wound #6 (Left Toe  Second) 1. Cleansed with: Clean wound with Normal Saline 2. Anesthetic Topical Lidocaine 4% cream to wound bed prior to debridement 4. Dressing Applied: Other dressing (specify in notes) 5. Secondary Dressing Applied ABD Pad Kerlix/Conform 7. Secured with Tape Notes silvercel Electronic Signature(s) Signed: 05/10/2017 4:17:47 PM By: Alric Quan Entered By: Alric Quan on 05/09/2017 08:49:42 Lillibridge, Imari E. ([id number]) --------------------------------------------------------------------------------  Wound Assessment Details Patient Name: Bodiford, BROADUS COSTILLA. Date of Service: 05/09/2017 8:45 AM Medical Record Number: [id number] Patient Account Number: [id number] Date of Birth/Sex: 11-Jul-1950 (67 y.o. Male) Treating RN: Ahmed Prima Primary Care Nataliyah Packham: Myrtie Hawk Other Clinician: Referring Shahara Hartsfield: Myrtie Hawk Treating Suvi Archuletta/Extender: Cathie Olden in Treatment: 32 Wound Status Wound Number: 9 Primary Diabetic Wound/Ulcer of the Lower Extremity Etiology: Wound Location: Right Toe Third Wound Open Wounding Event: Gradually Appeared Status: Date Acquired: 12/31/2016 Comorbid Anemia, Lymphedema, Congestive Heart Weeks Of Treatment: 18 History: Failure, Hypertension, Peripheral Venous Clustered Wound: No Disease, Type II Diabetes, Neuropathy Photos Photo Uploaded By: Alric Quan on 05/09/2017 16:30:20 Wound Measurements Length: (cm) 0.7 Width: (cm) 0.3 Depth: (cm) 0.1 Area: (cm) 0.165 Volume: (cm) 0.016 % Reduction in Area: 58% % Reduction in Volume: 59% Epithelialization: None Tunneling: No Undermining: No Wound Description Classification: Grade 2 Wound Margin: Flat and Intact Exudate Amount: Large Exudate Type: Serous Exudate Color: amber Foul Odor After Cleansing: Yes Due to Product Use: No Slough/Fibrino No Wound Bed Granulation Amount: Medium (34-66%) Exposed Structure Granulation Quality: Red Fascia  Exposed: No Necrotic Amount: Medium (34-66%) Fat Layer (Subcutaneous Tissue) Exposed: Yes Necrotic Quality: Adherent Slough Tendon Exposed: No Muscle Exposed: No Joint Exposed: No Bone Exposed: No Periwound Skin Texture Batres, Jaren E. ([id number]) Texture Color No Abnormalities Noted: No No Abnormalities Noted: No Callus: No Atrophie Blanche: No Crepitus: No Cyanosis: No Excoriation: No Ecchymosis: No Induration: No Erythema: No Rash: No Hemosiderin Staining: No Scarring: No Mottled: No Pallor: No Moisture Rubor: No No Abnormalities Noted: No Dry / Scaly: No Temperature / Pain Maceration: Yes Temperature: No Abnormality Wound Preparation Ulcer Cleansing: Rinsed/Irrigated with Saline Topical Anesthetic Applied: Other: lidocaine 4%, Treatment Notes Wound #9 (Right Toe Third) 1. Cleansed with: Clean wound with Normal Saline 2. Anesthetic Topical Lidocaine 4% cream to wound bed prior to debridement 4. Dressing Applied: Other dressing (specify in notes) 5. Secondary Dressing Applied ABD Pad Kerlix/Conform 7. Secured with Tape Notes silvercel Electronic Signature(s) Signed: 05/10/2017 4:17:47 PM By: Alric Quan Entered By: Alric Quan on 05/09/2017 08:50:27 Bring, [name] ([id number]) -------------------------------------------------------------------------------- Vitals Details Patient Name: Jeansonne, Dudley E. Date of Service: 05/09/2017 8:45 AM Medical Record Number: [id number] Patient Account Number: [id number] Date of Birth/Sex: 1950-09-27 (67 y.o. Male) Treating RN: Ahmed Prima Primary Care Quynh Basso: Myrtie Hawk Other Clinician: Referring Lansing Sigmon: Myrtie Hawk Treating Savannah Morford/Extender: Cathie Olden in Treatment: 32 Vital Signs Time Taken: 08:41 Temperature (F): 97.7 Height (in): 69 Pulse (bpm): 64 Weight (lbs): 168 Respiratory Rate (breaths/min): 16 Body Mass Index (BMI): 24.8 Blood Pressure (mmHg):  168/78 Reference Range: 80 - 120 mg / dl Electronic Signature(s) Signed: 05/10/2017 4:17:47 PM By: Alric Quan Entered By: Alric Quan on 05/09/2017 08:44:54

## 2017-05-12 NOTE — Progress Notes (Signed)
DEJION, GRILLO ([id number]) Visit Report for 05/09/2017 Chief Complaint Document Details Patient Name: [name], [name] Date of Service: 05/09/2017 8:45 AM Medical Record Number: [id number] Patient Account Number: [id number] Date of Birth/Sex: 1950/11/28 (67 y.o. Male) Treating RN: Ahmed Prima Primary Care Provider: Myrtie Hawk Other Clinician: Referring Provider: Myrtie Hawk Treating Provider/Extender: Cathie Olden in Treatment: 21 Information Obtained from: Patient Chief Complaint Patient presents for treatment of an open diabetic ulcer to both feet Electronic Signature(s) Signed: 05/09/2017 5:46:58 PM By: Lawanda Cousins Entered By: Lawanda Cousins on 05/09/2017 10:50:40 [name], [name] ([id number]) -------------------------------------------------------------------------------- HPI Details Patient Name: [name], [name] Date of Service: 05/09/2017 8:45 AM Medical Record Number: [id number] Patient Account Number: [id number] Date of Birth/Sex: 1950/10/15 (67 y.o. Male) Treating RN: Ahmed Prima Primary Care Provider: Myrtie Hawk Other Clinician: Referring Provider: Myrtie Hawk Treating Provider/Extender: Cathie Olden in Treatment: 32 History of Present Illness Location: bilateral feet ulceration on the toes Quality: Patient reports experiencing a dull pain to affected area(s). Severity: Patient states wound are getting better Duration: Patient has had the wound for > 3 months prior to seeking treatment at the wound center Timing: Pain in wound is constant (hurts all the time) Context: The wound would happen gradually Modifying Factors: Other treatment(s) tried include:treatment for lymphedema and is seen by the podiatrist Dr. Caryl Comes Associated Signs and Symptoms: Patient reports having increase swelling. HPI Description: 67 year old patient here to see as for bilateral feet ulceration to on his left first and second toe and 2  on his right first and second toe, which she's had for about 4 months. 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 Donella [name]. The patient is also undergoing workup by medical oncology for a intra-abdominal lymphadenopathy. 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 He is also status post appendectomy, inguinal lymph node biopsy, rectal biopsy and rectal examination under anesthesia. he currently smokes cigarettes about half packet a day. In March of this year he was seen by Dr. Hortencia Pilar, for evaluation of bilateral varicose veins and besides wearing compression stockings he had recommended laser ablation of the right and left great saphenous veins to eleviate the symptoms and complications of severe superficial venous reflux disease. He also recommended lymphedema pumps for better control of his lymphedema. The patient recently has had on 08/23/2016, right greater saphenous vein ablation with the laser energy Earlier lower extremity venous reflux examination done on 05/08/2016 showed no DVT or SVT both lower legs but incompetence of bilateral great saphenous veins was present. A lower arterial study was also done and there was no significant right lower and left lower extremity problems based on a normal toe brachial index bilaterally and the ABI was 1.21 the left and 1.23 on the right. His post ablation venous duplex examination showed successful ablation of the right GS vein with thrombus formation 2 below the right saphenofemoral junction. The deep system was patent without evidence of thrombosis and this was done on 08/30/2016. the patient also has a squamous cell cancer of the skin of the buttock and is recently undergone radiation therapy  for this prior to excisional surgery. Addendum: regarding his x-rays done today and x-ray of the left foot -- IMPRESSION: No objective evidence of osteomyelitis. There are soft tissue changes which  may reflect cellulitis. X-ray of the right foot -- IMPRESSION:Findings compatible with cellulitis of the toes. No objective evidence of osteomyelitis is observed. 10/01/16 on evaluation today patient's wounds appeared to be doing some better. I did review the x-rays as well which showed no evidence of osteomyelitis although there was evidence on x-ray of cellulitis. He fortunately is not having any discomfort although he continues to have some swelling. He does not remember being on any antibiotics recently. 10/15/16 on evaluation today patient's wounds overall appear to be doing better although he does have a new location noted on the left foot. Fortunately he is not having significant pain. It almost has the appearance that something is rubbing on the end of his toes but he wears the open toe shoes and according to what he is telling me never wears anything that would rub on his foot. There is no evidence of infection and specifically no evidence of a fungal infection 10/22/16 On evaluation today patient's wounds appeared to be doing better compared to last week in regard to his bilateral [name], [name] ([id number]) lower extremities. Fortunately I happy with how things are progressing although he still has ulcers I feel like that he is improving and appropriate manner. 11/12/16 on evaluation today patient appears to be doing well in regard to his bilateral feet and the respective wounds. We have been using surrounding her dressings along with an antifungal cream which seems to be doing very well. He has no bilateral dysfunction noticed that the rituals are weight loss at this point. He also has no nausea or vomiting a note purulent discharge. He did see Vein and vascular today and he tells me that they  told him he could have surgery for his venous stasis but they did not feel like it was worth it in his words. Fortunately patient's wounds do appear to be getting sneakily better. 11/26/2016 -- he says he is going to have some surgery during this week at [facility] for possibly a colon resection. 12/31/2016 -- the patient has been noncompliant with his smoking and I'm not sure whether he is also started drinking again. He continues to be very nonchalant about his care 01/14/2017 -- the patient's HandP has been reviewed well and I understand he is being compliant with trying to give up smoking and his local dressing changes. He does not have any surgical options of 4 to him by his vascular surgeons.he was last seen in early August by Dr. Hortencia Pilar who recommended compression stockings,and possibly lymph pumps in 2-3 months after doing a review ultrasound. 01/28/2017 - the patient did not have any fresh complaints but on examination I noted a large lacerated wound on the plantar aspect of his right fourth toe which had a lot of necrotic debris and it probes down to bone. 02/07/2017 -- x-ray of the right foot -- IMPRESSION: Soft tissue swelling about the first through fourth toes consistent with cellulitis. New destructive change in the tuft of the distal phalanx of the great toe is consistent with osteomyelitis. 02/14/2017 -- the patient's MRI is pending this coming Monday and he still continues to smoke. We have again gone over off loading of his wounds in great detail and he says he's been compliant. 02/21/2017 -- MR of the right foot -- IMPRESSION: 1. Soft tissue ulcer at the tip of the first, second and third toe knows. Cortical irregularity and bone marrow edema in the first distal phalanx most concerning for osteomyelitis. Mild marrow edema  in the second and third distal phalanx without definite cortical destruction which may reflect early osteomyelitis versus reactive marrow  edema. 2. Soft tissue edema surrounding the first phalanx most consistent with cellulitis. the patient was also recently evaluated by his medical oncologist Dr. 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 She is treating him with weekly Procrit. She is also keeping intra-abdominal lymphadenopathy and right lower lobe lung nodule under observation. 04/04/2017 -- he was seen by Dr. Adrian Prows on 03/25/2017 -- after review he empirically put him on ciprofloxacin and doxycycline as they have good bone penetration and good bioavailability and it will cover the usual pathogens and diabetic foot osteomyelitis. He will check inflammatory markers and plan a 9-42 week old records. C-reactive protein was 0.3 and the ESR was 72 04/18/17 on evaluation today patient appears to be doing about the same in regard to his lower extremity wounds bilaterally. He has continued to use the antifungal cream which does seem to be beneficial. Nonetheless the ulcers do seem to in some areas be epithelial eyes over and in other areas are still open. He is having no significant discomfort. 04/25/17-he is here in follow-up evaluation for multiple ulcerations to multiple toes bilaterally. He states he did see Dr. Ola Spurr again last week and continues antibiotic therapy. He is voicing no complaints or concerns, will continue with current treatment plan will possibility of adding compression therapy next week after an additional week of treatment/lotions to BLE prescribed by Dr Ola Spurr 05/02/17 he is here in follow up for for multiple ulcers to multiple toes bilaterally. we will stop using antifungal cream and will continue with silvercel and follow up next week 05/09/17-he is here in follow-up for multiple ulcerations to multiple toes bilaterally. There is improvement in appearance. He has not completely stopped using antifungal cream, but admits he has  not using it between the toes. He has an appointment with Dr. Ola Spurr on 2/11, continues on doxycycline and Cipro. It has been 5 weeks of antibiotic therapy, we will order plain film xray to evaluate for osteomyelitis next week, prior to follow up with ID. Will continue with silvercel and follow up next week Electronic Signature(s) Signed: 05/09/2017 5:46:58 PM By: Stanton Kidney ([id number]) Entered By: Lawanda Cousins on 05/09/2017 11:05:07 [name], [name] ([id number]) -------------------------------------------------------------------------------- Physician Orders Details Patient Name: Cwynar, Mohammad E. Date of Service: 05/09/2017 8:45 AM Medical Record Number: [id number] Patient Account Number: [id number] Date of Birth/Sex: 03-20-1951 (67 y.o. Male) Treating RN: Ahmed Prima Primary Care Provider: Myrtie Hawk Other Clinician: Referring Provider: Myrtie Hawk Treating Provider/Extender: Cathie Olden in Treatment: 15 Verbal / Phone Orders: Yes Clinician: Carolyne Fiscal, Debi Read Back and Verified: Yes Diagnosis Coding Wound Cleansing Wound #1 Right Toe Great o Clean wound with Normal Saline. o May Shower, gently pat wound dry prior to applying new dressing. Wound #2 Right Toe Second o Clean wound with Normal Saline. o May Shower, gently pat wound dry prior to applying new dressing. Wound #5 Left Toe Great o Clean wound with Normal Saline. o May Shower, gently pat wound dry prior to applying new dressing. Wound #6 Left Toe Second o Clean wound with Normal Saline. o May Shower, gently pat wound dry prior to applying new dressing. Wound #9 Right Toe Third o Clean wound with Normal Saline. o May Shower, gently pat wound dry prior to applying new dressing. Anesthetic (add to Medication List) Wound #1 Right  Toe Great o Topical Lidocaine 4% cream applied to wound bed prior to debridement (In Clinic Only). Wound #2 Right Toe  Second o Topical Lidocaine 4% cream applied to wound bed prior to debridement (In Clinic Only). Wound #5 Left Toe Great o Topical Lidocaine 4% cream applied to wound bed prior to debridement (In Clinic Only). Wound #6 Left Toe Second o Topical Lidocaine 4% cream applied to wound bed prior to debridement (In Clinic Only). Wound #9 Right Toe Third o Topical Lidocaine 4% cream applied to wound bed prior to debridement (In Clinic Only). Primary Wound Dressing Wound #1 Right Toe Great o Silvercel Non-Adherent Wound #2 Right Toe Second o Silvercel Non-Adherent Wound #5 Left Toe Great [name], [name] ([id number]) o Silvercel Non-Adherent Wound #6 Left Toe Second o Silvercel Non-Adherent Wound #9 Right Toe Third o Silvercel Non-Adherent Secondary Dressing Wound #1 Right Toe Great o ABD and Kerlix/Conform Wound #2 Right Toe Second o ABD and Kerlix/Conform Wound #5 Left Toe Great o ABD and Kerlix/Conform Wound #6 Left Toe Second o ABD and Kerlix/Conform Wound #9 Right Toe Third o ABD and Kerlix/Conform Dressing Change Frequency Wound #1 Right Toe Great o Change dressing every day. Wound #2 Right Toe Second o Change dressing every day. Wound #5 Left Toe Great o Change dressing every day. Wound #6 Left Toe Second o Change dressing every day. Wound #9 Right Toe Third o Change dressing every day. Follow-up Appointments Wound #1 Right Toe Great o Return Appointment in 1 week. Wound #2 Right Toe Second o Return Appointment in 1 week. Wound #5 Left Toe Great o Return Appointment in 1 week. Wound #6 Left Toe Second o Return Appointment in 1 week. Wound #9 Right Toe Third o Return Appointment in 1 week. BRENNDEN, MASTEN ([id number]) Edema Control Wound #1 Right Toe Great o Patient to wear own Juxtalite/Juzo compression garment. Wound #2 Right Toe Second o Patient to wear own Juxtalite/Juzo compression garment. Wound #5 Left Toe  Great o Patient to wear own Juxtalite/Juzo compression garment. Wound #6 Left Toe Second o Patient to wear own Juxtalite/Juzo compression garment. Wound #9 Right Toe Third o Patient to wear own Juxtalite/Juzo compression garment. Additional Orders / Instructions Wound #1 Right Toe Great o Stop Smoking o Increase protein intake. o Other: - Please add vitamin A, vitamin C and zinc supplements to your diet Wound #2 Right Toe Second o Stop Smoking o Increase protein intake. o Other: - Please add vitamin A, vitamin C and zinc supplements to your diet Wound #5 Left Toe Great o Stop Smoking o Increase protein intake. o Other: - Please add vitamin A, vitamin C and zinc supplements to your diet Wound #6 Left Toe Second o Stop Smoking o Increase protein intake. o Other: - Please add vitamin A, vitamin C and zinc supplements to your diet Wound #9 Right Toe Third o Stop Smoking o Increase protein intake. o Other: - Please add vitamin A, vitamin C and zinc supplements to your diet Patient Medications Allergies: No Known Drug Allergies Notifications Medication Indication Start End lidocaine DOSE 1 - topical 4 % cream - 1 cream topical Electronic Signature(s) Signed: 05/09/2017 5:46:58 PM By: Lawanda Cousins Entered By: Lawanda Cousins on 05/09/2017 10:55:06 Garritano, [name] ([id number]) [name], [name] ([id number]) -------------------------------------------------------------------------------- Prescription 05/09/2017 Patient Name: [name] Provider: Lawanda Cousins NP Date of Birth: 21-Jul-1950 NPI#: [id number] Sex: Jerilynn Mages DEA#: [id number] Phone #: [phone] License #: Patient Address: [address]  [place], [place] [address] [address] [place], [place] [address] [phone] Allergies No Known Drug Allergies Medication Medication: Route: Strength:  Form: lidocaine 4 % topical cream topical 4% cream Class: TOPICAL LOCAL ANESTHETICS Dose: Frequency / Time: Indication: 1 1 cream topical Number of Refills: Number of Units: 0 Generic Substitution: Start Date: End Date: One Time Use: Substitution Permitted No Note to Pharmacy: Signature(s): Date(s): Electronic Signature(s) Signed: 05/09/2017 5:46:58 PM By: Lawanda Cousins Entered By: Lawanda Cousins on 05/09/2017 10:55:07 [name], [name] ([id number]) --------------------------------------------------------------------------------  Problem List Details Patient Name: Gearhart, Brook E. Date of Service: 05/09/2017 8:45 AM Medical Record Number: [id number] Patient Account Number: [id number] Date of Birth/Sex: 1951-02-07 (66 y.o. Male) Treating RN: Ahmed Prima Primary Care Provider: Myrtie Hawk Other Clinician: Referring Provider: Myrtie Hawk Treating Provider/Extender: Cathie Olden in Treatment: 32 Active Problems ICD-10 Encounter Code Description Active Date Diagnosis E11.621 Type 2 diabetes mellitus with foot ulcer 09/21/2016 Yes I87.313 Chronic venous hypertension (idiopathic) with ulcer of bilateral 09/21/2016 Yes lower extremity I89.0 Lymphedema, not elsewhere classified 09/21/2016 Yes L97.522 Non-pressure chronic ulcer of other part of left foot with fat layer 09/21/2016 Yes exposed L97.512 Non-pressure chronic ulcer of other part of right foot with fat layer 09/21/2016 Yes exposed F17.218 Nicotine dependence, cigarettes, with other nicotine-induced 09/21/2016 Yes disorders F10.19 Alcohol abuse with unspecified alcohol-induced disorder 09/21/2016 Yes M86.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, right ankle and foot 02/21/2017 Yes Inactive Problems Resolved Problems Electronic Signature(s) Signed: 05/09/2017 5:46:58 PM By: Lawanda Cousins Entered By: Lawanda Cousins on 05/09/2017 10:49:27 Dozier, [name]  ([id number]) -------------------------------------------------------------------------------- Progress Note Details Patient Name: Unrein, Landy E. Date of Service: 05/09/2017 8:45 AM Medical Record Number: [id number] Patient Account Number: [id number] Date of Birth/Sex: 06-Mar-1951 (67 y.o. Male) Treating RN: Ahmed Prima Primary Care Provider: Myrtie Hawk Other Clinician: Referring Provider: Myrtie Hawk Treating Provider/Extender: Cathie Olden in Treatment: 32 Subjective Chief Complaint Information obtained from Patient Patient presents for treatment of an open diabetic ulcer to both feet History of Present Illness (HPI) The following HPI elements were documented for the patient's wound: Location: bilateral feet ulceration on the toes Quality: Patient reports experiencing a dull pain to affected area(s). Severity: Patient states wound are getting better Duration: Patient has had the wound for > 3 months prior to seeking treatment at the wound center Timing: Pain in wound is constant (hurts all the time) Context: The wound would happen gradually Modifying Factors: Other treatment(s) tried include:treatment for lymphedema and is seen by the podiatrist Dr. Caryl Comes Associated Signs and Symptoms: Patient reports having increase swelling. 68 year old patient here to see as for bilateral feet ulceration to on his left first and second toe and 2 on his right first and second toe, which she's had for about 4 months. 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 Donella [name]. The patient is also undergoing workup by medical oncology for a intra-abdominal lymphadenopathy. 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 He is also status  post appendectomy, inguinal lymph node biopsy, rectal biopsy and rectal examination under anesthesia. he currently smokes cigarettes about half packet a day. In March of this year he was seen by Dr. Hortencia Pilar, for evaluation of bilateral varicose veins and besides wearing compression stockings he had recommended laser ablation of  the right and left great saphenous veins to eleviate the symptoms and complications of severe superficial venous reflux disease. He also recommended lymphedema pumps for better control of his lymphedema. The patient recently has had on 08/23/2016, right greater saphenous vein ablation with the laser energy Earlier lower extremity venous reflux examination done on 05/08/2016 showed no DVT or SVT both lower legs but incompetence of bilateral great saphenous veins was present. A lower arterial study was also done and there was no significant right lower and left lower extremity problems based on a normal toe brachial index bilaterally and the ABI was 1.21 the left and 1.23 on the right. His post ablation venous duplex examination showed successful ablation of the right GS vein with thrombus formation 2 below the right saphenofemoral junction. The deep system was patent without evidence of thrombosis and this was done on 08/30/2016. 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 Addendum: regarding his x-rays done today and x-ray of the left foot -- IMPRESSION: No objective evidence of osteomyelitis. There are soft tissue changes which may reflect cellulitis. X-ray of the right foot -- IMPRESSION:Findings compatible with cellulitis of the toes. No objective evidence of osteomyelitis is observed. 10/01/16 on evaluation today patient's wounds appeared to be doing some better. I did review the x-rays as well which showed no evidence of osteomyelitis although there  was evidence on x-ray of cellulitis. He fortunately is not having any discomfort [name], [name] ([id number]) although he continues to have some swelling. He does not remember being on any antibiotics recently. 10/15/16 on evaluation today patient's wounds overall appear to be doing better although he does have a new location noted on the left foot. Fortunately he is not having significant pain. It almost has the appearance that something is rubbing on the end of his toes but he wears the open toe shoes and according to what he is telling me never wears anything that would rub on his foot. There is no evidence of infection and specifically no evidence of a fungal infection 10/22/16 On evaluation today patient's wounds appeared to be doing better compared to last week in regard to his bilateral lower extremities. Fortunately I happy with how things are progressing although he still has ulcers I feel like that he is improving and appropriate manner. 11/12/16 on evaluation today patient appears to be doing well in regard to his bilateral feet and the respective wounds. We have been using surrounding her dressings along with an antifungal cream which seems to be doing very well. He has no bilateral dysfunction noticed that the rituals are weight loss at this point. He also has no nausea or vomiting a note purulent discharge. He did see Vein and vascular today and he tells me that they told him he could have surgery for his venous stasis but they did not feel like it was worth it in his words. Fortunately patient's wounds do appear to be getting sneakily better. 11/26/2016 -- he says he is going to have some surgery during this week at [facility] for possibly a colon resection. 12/31/2016 -- the patient has been noncompliant with his smoking and I'm not sure whether he is also started drinking again. He continues to be very nonchalant about his care 01/14/2017 -- the patient's HandP has been reviewed well  and I understand he is being compliant with trying to give up smoking and  his local dressing changes. He does not have any surgical options of 4 to him by his vascular surgeons.he was last seen in early August by Dr. Hortencia Pilar who recommended compression stockings,and possibly lymph pumps in 2-3 months after doing a review ultrasound. 01/28/2017 - the patient did not have any fresh complaints but on examination I noted a large lacerated wound on the plantar aspect of his right fourth toe which had a lot of necrotic debris and it probes down to bone. 02/07/2017 -- x-ray of the right foot -- IMPRESSION: Soft tissue swelling about the first through fourth toes consistent with cellulitis. New destructive change in the tuft of the distal phalanx of the great toe is consistent with osteomyelitis. 02/14/2017 -- the patient's MRI is pending this coming Monday and he still continues to smoke. We have again gone over off loading of his wounds in great detail and he says he's been compliant. 02/21/2017 -- MR of the right foot -- IMPRESSION: 1. Soft tissue ulcer at the tip of the first, second and third toe knows. Cortical irregularity and bone marrow edema in the first distal phalanx most concerning for osteomyelitis. Mild marrow edema in the second and third distal phalanx without definite cortical destruction which may reflect early osteomyelitis versus reactive marrow edema. 2. Soft tissue edema surrounding the first phalanx most consistent with cellulitis. the patient was also recently evaluated by his medical oncologist Dr. 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 She is treating him with weekly Procrit. She is also keeping intra-abdominal lymphadenopathy and right lower lobe lung nodule under observation. 04/04/2017 -- he was seen by Dr. Adrian Prows on 03/25/2017 -- after review he empirically put him on ciprofloxacin  and doxycycline as they have good bone penetration and good bioavailability and it will cover the usual pathogens and diabetic foot osteomyelitis. He will check inflammatory markers and plan a 45-69 week old records. C-reactive protein was 0.3 and the ESR was 72 04/18/17 on evaluation today patient appears to be doing about the same in regard to his lower extremity wounds bilaterally. He has continued to use the antifungal cream which does seem to be beneficial. Nonetheless the ulcers do seem to in some areas be epithelial eyes over and in other areas are still open. He is having no significant discomfort. 04/25/17-he is here in follow-up evaluation for multiple ulcerations to multiple toes bilaterally. He states he did see Dr. Ola Spurr again last week and continues antibiotic therapy. He is voicing no complaints or concerns, will continue with current treatment plan will possibility of adding compression therapy next week after an additional week of treatment/lotions to BLE prescribed by Dr Ola Spurr 05/02/17 he is here in follow up for for multiple ulcers to multiple toes bilaterally. we will stop using antifungal cream and will continue with silvercel and follow up next week [name], [name] ([id number]) 05/09/17-he is here in follow-up for multiple ulcerations to multiple toes bilaterally. There is improvement in appearance. He has not completely stopped using antifungal cream, but admits he has not using it between the toes. He has an appointment with Dr. Ola Spurr on 2/11, continues on doxycycline and Cipro. It has been 5 weeks of antibiotic therapy, we will order plain film xray to evaluate for osteomyelitis next week, prior to follow up with ID. Will continue with silvercel and follow up next week Patient History Information obtained from Patient. Social History Current every day smoker, Marital  Status - Widowed, Alcohol Use - Daily - quit drinking about a week ago, Drug Use - No History,  Caffeine Use - Moderate. 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 Integumentary (Hair, Skin) Wound #1 status is Open. Original cause of wound was Gradually Appeared. The wound is located on the Right Toe Great. The wound measures 0.8cm length x 5.4cm width x 0.1cm depth; 3.393cm^2 area and 0.339cm^3 volume. There is Fat Layer (Subcutaneous Tissue) Exposed exposed. There is no tunneling or undermining noted. There is a large amount of serous drainage noted. Foul odor after cleansing was noted. The wound margin is flat and intact. There is medium (34-66%) red granulation within the wound bed. There is a small (1-33%) amount of necrotic tissue within the wound bed including Adherent Slough. The periwound skin appearance exhibited: Excoriation, Maceration. The periwound skin appearance did not exhibit: Callus, Crepitus, Induration, Rash, Scarring, Dry/Scaly, Atrophie Blanche, Cyanosis, Ecchymosis, Hemosiderin Staining, Mottled, Pallor, Rubor, Erythema. Periwound temperature was noted as No Abnormality. Wound #2 status is Open. Original cause of wound was Gradually Appeared. The wound is located on the Right Toe Second. The wound measures 0.6cm length x 1.8cm width x 0.1cm depth; 0.848cm^2 area and 0.085cm^3 volume. There is Fat Layer (Subcutaneous Tissue) Exposed exposed. There is no tunneling or undermining noted. There is a large amount of serosanguineous drainage noted. Foul odor after cleansing was noted. The wound margin is flat and intact. There is large (67-100%) red granulation within the wound bed. There is a small (1-33%) amount of necrotic tissue within the wound bed including Adherent Slough. The periwound skin appearance  exhibited: Maceration. The periwound skin appearance did not exhibit: Callus, Crepitus, Excoriation, Induration, Rash, Scarring, Dry/Scaly, Atrophie Blanche, Cyanosis, Ecchymosis, Hemosiderin Staining, Mottled, Pallor, Rubor, Erythema. Periwound temperature was noted as No Abnormality. Wound #5 status is Open. Original cause of wound was Gradually Appeared. The wound is located on the Left Toe Great. The wound measures 1cm length x 2cm width x 0.2cm depth; 1.571cm^2 area and 0.314cm^3 volume. There is Fat Layer (Subcutaneous Tissue) Exposed exposed. There is no tunneling or undermining noted. There is a large amount of serosanguineous drainage noted. Foul odor after cleansing was noted. The wound margin is flat and intact. There is medium (34-66%) red granulation within the wound bed. There is a small (1-33%) amount of necrotic tissue within the wound bed including Adherent Slough. The periwound skin appearance exhibited: Maceration. The periwound skin appearance did not exhibit: Callus, Crepitus, Excoriation, Induration, Rash, Scarring, Dry/Scaly, Atrophie Blanche, Cyanosis, Ecchymosis, [place], Tyreque E. ([id number]) Hemosiderin Staining, Mottled, Pallor, Rubor, Erythema. Periwound temperature was noted as No Abnormality. Wound #6 status is Open. Original cause of wound was Gradually Appeared. The wound is located on the Left Toe Second. The wound measures 1.2cm length x 1.3cm width x 0.1cm depth; 1.225cm^2 area and 0.123cm^3 volume. There is Fat Layer (Subcutaneous Tissue) Exposed exposed. There is no tunneling or undermining noted. There is a large amount of serous drainage noted. Foul odor after cleansing was noted. The wound margin is flat and intact. There is large (67-100%) red granulation within the wound bed. There is a small (1-33%) amount of necrotic tissue within the wound bed including Adherent Slough. The periwound skin appearance exhibited: Maceration. The periwound skin appearance did  not exhibit: Callus, Crepitus, Excoriation, Induration, Rash, Scarring, Dry/Scaly, Atrophie Blanche, Cyanosis, Ecchymosis, Hemosiderin Staining, Mottled, Pallor, Rubor, Erythema. Periwound temperature was noted as No Abnormality. Wound #9 status is Open. Original cause of wound was Gradually Appeared. The wound is located on the Right Toe Third. The wound measures 0.7cm length x 0.3cm width x 0.1cm depth; 0.165cm^2 area and 0.016cm^3 volume. There is Fat Layer (Subcutaneous Tissue) Exposed exposed. There is no tunneling or undermining noted. There is a large amount of serous drainage noted. Foul odor after cleansing was noted. The wound margin is flat and intact. There is medium (34-66%) red granulation within the wound bed. There is a medium (34-66%) amount of necrotic tissue within the wound bed including Adherent Slough. The periwound skin appearance exhibited: Maceration. The periwound skin appearance did not exhibit: Callus, Crepitus, Excoriation, Induration, Rash, Scarring, Dry/Scaly, Atrophie Blanche, Cyanosis, Ecchymosis, Hemosiderin Staining, Mottled, Pallor, Rubor, Erythema. Periwound temperature was noted as No Abnormality. 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 May Shower, gently pat wound dry prior to applying new dressing. Wound #2 Right  Toe Second: Clean wound with Normal Saline. May Shower, gently pat wound dry prior to applying new dressing. Wound #5 Left Toe Great: Clean wound with Normal Saline. May Shower, gently pat wound dry prior to applying new dressing. Wound #6 Left Toe Second: Clean wound with Normal Saline. May Shower, gently pat wound dry prior to applying new dressing. Wound #9 Right Toe Third: [name], [name] ([id number]) Clean wound with Normal Saline. May Shower, gently pat wound dry prior to applying new dressing. Anesthetic (add to Medication List): Wound #1 Right Toe Great: Topical Lidocaine 4% cream applied to wound bed prior to debridement (In Clinic Only). Wound #2 Right Toe Second: Topical Lidocaine 4% cream applied to wound bed prior to debridement (In Clinic Only). Wound #5 Left Toe Great: Topical Lidocaine 4% cream applied to wound bed prior to debridement (In Clinic Only). Wound #6 Left Toe Second: Topical Lidocaine 4% cream applied to wound bed prior to debridement (In Clinic Only). Wound #9 Right Toe Third: Topical Lidocaine 4% cream applied to wound bed prior to debridement (In Clinic Only). Primary Wound Dressing: Wound #1 Right Toe Great: Silvercel Non-Adherent Wound #2 Right Toe Second: Silvercel Non-Adherent Wound #5 Left Toe Great: Silvercel Non-Adherent Wound #6 Left Toe Second: Silvercel Non-Adherent Wound #9 Right Toe Third: Silvercel Non-Adherent Secondary Dressing: Wound #1 Right Toe Great: ABD and Kerlix/Conform Wound #2 Right Toe Second: ABD and Kerlix/Conform Wound #5 Left Toe Great: ABD and Kerlix/Conform Wound #6 Left Toe Second: ABD and Kerlix/Conform Wound #9 Right Toe Third: ABD and Kerlix/Conform Dressing Change Frequency: Wound #1 Right Toe Great: Change dressing every day. Wound #2 Right Toe Second: Change dressing every day. Wound #5 Left Toe Great: Change dressing every day. Wound #6 Left Toe Second: Change dressing every day. Wound #9 Right  Toe Third: Change dressing every day. Follow-up Appointments: Wound #1 Right Toe Great: Return  Appointment in 1 week. Wound #2 Right Toe Second: Return Appointment in 1 week. Wound #5 Left Toe Great: Return Appointment in 1 week. Wound #6 Left Toe Second: Return Appointment in 1 week. Wound #9 Right Toe Third: Return Appointment in 1 week. Edema Control: Wound #1 Right Toe Great: Patient to wear own Juxtalite/Juzo compression garment. MAITLAND, LESIAK ([id number]) Wound #2 Right Toe Second: Patient to wear own Juxtalite/Juzo compression garment. Wound #5 Left Toe Great: Patient to wear own Juxtalite/Juzo compression garment. Wound #6 Left Toe Second: Patient to wear own Juxtalite/Juzo compression garment. Wound #9 Right Toe Third: Patient to wear own Juxtalite/Juzo compression garment. Additional Orders / Instructions: Wound #1 Right Toe Great: Stop Smoking Increase protein intake. Other: - Please add vitamin A, vitamin C and zinc supplements to your diet Wound #2 Right Toe Second: Stop Smoking Increase protein intake. Other: - Please add vitamin A, vitamin C and zinc supplements to your diet Wound #5 Left Toe Great: Stop Smoking Increase protein intake. Other: - Please add vitamin A, vitamin C and zinc supplements to your diet Wound #6 Left Toe Second: Stop Smoking Increase protein intake. Other: - Please add vitamin A, vitamin C and zinc supplements to your diet Wound #9 Right Toe Third: Stop Smoking Increase protein intake. Other: - Please add vitamin A, vitamin C and zinc supplements to your diet The following medication(s) was prescribed: lidocaine topical 4 % cream 1 1 cream topical was prescribed at facility 1. Continuous over self 2. Plain film x-ray of bilateral feet at/prior to next weeks appt 3. Continue with doxycycline and Cipro per Dr. Ola Spurr 4. Follow-up with Dr. Ola Spurr on 2/11 as scheduled 5. Follow-up next week Electronic  Signature(s) Signed: 05/09/2017 5:46:58 PM By: Lawanda Cousins Entered By: Lawanda Cousins on 05/09/2017 11:06:16 Desilva, [name] ([id number]) -------------------------------------------------------------------------------- ROS/PFSH Details Patient Name: Ambs, Firas E. Date of Service: 05/09/2017 8:45 AM Medical Record Number: [id number] Patient Account Number: [id number] Date of Birth/Sex: [birth year] (67 y.o. Male) Treating RN: Ahmed Prima Primary Care Provider: Myrtie Hawk Other Clinician: Referring Provider: Myrtie Hawk Treating Provider/Extender: Cathie Olden in Treatment: 32 Information Obtained From Patient Wound History Do you currently have one or more open woundso Yes How many open wounds do you currently haveo 6 Approximately how long have you had your woundso 3 months How have you been treating your wound(s) until nowo ointment and bandage Has your wound(s) ever healed and then re-openedo No Have you had any lab work done in the past montho No Have you tested positive for an antibiotic resistant organism (MRSA, VRE)o No Have you tested positive for osteomyelitis (bone infection)o No Have you had any tests for circulation on your legso Yes Who ordered the testo PCP Where was the test doneo [facility] Medical History: Negative for: Cataracts; Glaucoma; Optic Neuritis Ear/Nose/Mouth/Throat Medical History: Negative for: Chronic sinus problems/congestion; Middle ear problems Hematologic/Lymphatic Medical History: Positive for: Anemia; Lymphedema Negative for: Hemophilia; Human Immunodeficiency Virus; Sickle Cell Disease Respiratory Medical History: Negative for: Aspiration; Asthma; Chronic Obstructive Pulmonary Disease (COPD); Pneumothorax; Sleep Apnea; Tuberculosis Cardiovascular Medical History: Positive for: Congestive Heart Failure; Hypertension; Peripheral Venous Disease Negative for: Angina; Arrhythmia; Coronary Artery Disease; Deep  Vein Thrombosis; Hypotension; Myocardial Infarction; Peripheral Arterial Disease; Phlebitis; Vasculitis Gastrointestinal Medical History: Negative for: Cirrhosis ; Colitis; Crohnos; Hepatitis A; Hepatitis B; Hepatitis C Beougher, Brandom E. ([id number]) Endocrine Medical History: Positive for: Type II Diabetes Treated with: Oral agents Blood sugar tested every day: Yes Tested : QD Genitourinary Medical History:  Negative for: End Stage Renal Disease Immunological Medical History: Negative for: Lupus Erythematosus; Raynaudos; Scleroderma Integumentary (Skin) Medical History: Negative for: History of Burn; History of pressure wounds Musculoskeletal Medical History: Negative for: Gout; Rheumatoid Arthritis; Osteoarthritis; Osteomyelitis Neurologic Medical History: Positive for: Neuropathy Negative for: Dementia; Quadriplegia; Paraplegia; Seizure Disorder Oncologic Medical History: Past Medical History Notes: 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 Electronic Signature(s) Signed: 05/09/2017 5:46:58 PM By: Lawanda Cousins Signed: 05/10/2017 4:17:47 PM By: Warnell Bureau ([id number]) Entered By: Lawanda Cousins on 05/09/2017 10:54:42 Dara, [name] ([id number]) -------------------------------------------------------------------------------- SuperBill Details Patient Name: Aydelotte,  Wladyslaw E. Date of Service: 05/09/2017 Medical Record Number: [id number] Patient Account Number: [id number] Date of Birth/Sex: 1950/12/17 (67 y.o. Male) Treating RN: Ahmed Prima Primary Care Provider: Myrtie Hawk Other Clinician: Referring Provider: Myrtie Hawk Treating Provider/Extender: Cathie Olden in Treatment: 32 Diagnosis Coding ICD-10 Codes Code Description E11.621 Type 2 diabetes mellitus with foot ulcer I87.313 Chronic venous hypertension (idiopathic) with ulcer of bilateral lower extremity I89.0 Lymphedema, not elsewhere classified L97.522 Non-pressure chronic ulcer of other part of left foot with fat layer exposed L97.512 Non-pressure chronic ulcer of other part of right foot with fat layer exposed F17.218 Nicotine dependence, cigarettes, 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 abuse 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, right ankle and foot Facility Procedures CPT4 Code: [id number] Description: 11155 - WOUND CARE VISIT-LEV 5 EST PT Modifier: Quantity: 1 Physician Procedures CPT4 Code: [id number] Description: 99213 - WC PHYS LEVEL 3 - EST PT ICD-10 Diagnosis Description E11.621 Type 2 diabetes mellitus with foot ulcer L97.522 Non-pressure chronic ulcer of other part of left foot with fat L97.512 Non-pressure chronic ulcer of other part of right  foot with fat I89.0 Lymphedema, not elsewhere classified Modifier: layer exposed layer exposed Quantity: 1 Electronic Signature(s) Signed: 05/09/2017 12:40:17 PM By: Alric Quan Signed: 05/09/2017 5:46:58 PM By: Lawanda Cousins Entered By: Alric Quan on 05/09/2017 12:40:17

## 2017-05-14 ENCOUNTER — Ambulatory Visit: Payer: Medicare HMO | Admitting: Oncology

## 2017-05-14 ENCOUNTER — Ambulatory Visit: Payer: Medicare HMO

## 2017-05-14 ENCOUNTER — Other Ambulatory Visit: Payer: Medicare HMO

## 2017-05-16 ENCOUNTER — Inpatient Hospital Stay (HOSPITAL_BASED_OUTPATIENT_CLINIC_OR_DEPARTMENT_OTHER): Payer: Medicare HMO | Admitting: Oncology

## 2017-05-16 ENCOUNTER — Ambulatory Visit
Admission: RE | Admit: 2017-05-16 | Discharge: 2017-05-16 | Disposition: A | Discharge status: Home / Self Care | Payer: Medicare HMO | Source: Ambulatory Visit | Attending: Nurse Practitioner | Admitting: Nurse Practitioner

## 2017-05-16 ENCOUNTER — Encounter: Payer: Medicare HMO | Attending: Nurse Practitioner | Admitting: Nurse Practitioner

## 2017-05-16 ENCOUNTER — Encounter: Payer: Self-pay | Admitting: Oncology

## 2017-05-16 ENCOUNTER — Other Ambulatory Visit: Payer: Self-pay | Admitting: *Deleted

## 2017-05-16 ENCOUNTER — Inpatient Hospital Stay: Payer: Medicare HMO | Attending: Oncology

## 2017-05-16 ENCOUNTER — Inpatient Hospital Stay: Payer: Medicare HMO

## 2017-05-16 ENCOUNTER — Other Ambulatory Visit: Payer: Self-pay | Admitting: Nurse Practitioner

## 2017-05-16 VITALS — BP 180/90 | HR 68

## 2017-05-16 VITALS — BP 178/91 | HR 72 | Temp 97.3°F | Resp 18 | Wt 173.0 lb

## 2017-05-16 DIAGNOSIS — D472 Monoclonal gammopathy: Secondary | ICD-10-CM

## 2017-05-16 DIAGNOSIS — Z862 Personal history of diseases of the blood and blood-forming organs and certain disorders involving the immune mechanism: Secondary | ICD-10-CM

## 2017-05-16 DIAGNOSIS — E1122 Type 2 diabetes mellitus with diabetic chronic kidney disease: Secondary | ICD-10-CM | POA: Insufficient documentation

## 2017-05-16 DIAGNOSIS — Z923 Personal history of irradiation: Secondary | ICD-10-CM | POA: Insufficient documentation

## 2017-05-16 DIAGNOSIS — D631 Anemia in chronic kidney disease: Secondary | ICD-10-CM

## 2017-05-16 DIAGNOSIS — I129 Hypertensive chronic kidney disease with stage 1 through stage 4 chronic kidney disease, or unspecified chronic kidney disease: Secondary | ICD-10-CM

## 2017-05-16 DIAGNOSIS — S81801A Unspecified open wound, right lower leg, initial encounter: Secondary | ICD-10-CM

## 2017-05-16 DIAGNOSIS — E1151 Type 2 diabetes mellitus with diabetic peripheral angiopathy without gangrene: Secondary | ICD-10-CM | POA: Diagnosis not present

## 2017-05-16 DIAGNOSIS — I509 Heart failure, unspecified: Secondary | ICD-10-CM | POA: Insufficient documentation

## 2017-05-16 DIAGNOSIS — N189 Chronic kidney disease, unspecified: Secondary | ICD-10-CM | POA: Diagnosis not present

## 2017-05-16 DIAGNOSIS — M7731 Calcaneal spur, right foot: Secondary | ICD-10-CM | POA: Insufficient documentation

## 2017-05-16 DIAGNOSIS — Z79899 Other long term (current) drug therapy: Secondary | ICD-10-CM | POA: Diagnosis not present

## 2017-05-16 DIAGNOSIS — D638 Anemia in other chronic diseases classified elsewhere: Secondary | ICD-10-CM

## 2017-05-16 DIAGNOSIS — K219 Gastro-esophageal reflux disease without esophagitis: Secondary | ICD-10-CM | POA: Insufficient documentation

## 2017-05-16 DIAGNOSIS — M85871 Other specified disorders of bone density and structure, right ankle and foot: Secondary | ICD-10-CM | POA: Diagnosis not present

## 2017-05-16 DIAGNOSIS — E11621 Type 2 diabetes mellitus with foot ulcer: Secondary | ICD-10-CM | POA: Diagnosis present

## 2017-05-16 DIAGNOSIS — F17210 Nicotine dependence, cigarettes, uncomplicated: Secondary | ICD-10-CM | POA: Diagnosis not present

## 2017-05-16 DIAGNOSIS — D509 Iron deficiency anemia, unspecified: Secondary | ICD-10-CM

## 2017-05-16 DIAGNOSIS — L97522 Non-pressure chronic ulcer of other part of left foot with fat layer exposed: Secondary | ICD-10-CM | POA: Insufficient documentation

## 2017-05-16 DIAGNOSIS — Z85828 Personal history of other malignant neoplasm of skin: Secondary | ICD-10-CM | POA: Insufficient documentation

## 2017-05-16 DIAGNOSIS — D649 Anemia, unspecified: Secondary | ICD-10-CM

## 2017-05-16 DIAGNOSIS — L97419 Non-pressure chronic ulcer of right heel and midfoot with unspecified severity: Secondary | ICD-10-CM | POA: Insufficient documentation

## 2017-05-16 LAB — HEMOGLOBIN: HEMOGLOBIN: 9.7 g/dL — AB (ref 13.0–18.0)

## 2017-05-16 MED ORDER — EPOETIN ALFA 40000 UNIT/ML IJ SOLN
40000.0000 [IU] | INTRAMUSCULAR | Status: DC
  Administered 2017-05-16: 40000 [IU] via SUBCUTANEOUS
  Filled 2017-05-16: qty 1

## 2017-05-16 NOTE — Progress Notes (Signed)
Confirmed with Dr. Janese Banks to proceed with Procrit with bp 170/90.

## 2017-05-17 NOTE — Progress Notes (Signed)
[name], [name] ([id number]) Visit Report for 05/16/2017 Arrival Information Details Patient Name: [name], [name] Date of Service: 05/16/2017 8:45 AM Medical Record Number: [id number] Patient Account Number: [id number] Date of Birth/Sex: 03/21/1951 (67 y.o. Male) Treating RN: Ahmed Prima Primary Care Terius Jacuinde: Myrtie Hawk Other Clinician: Referring Lottie Siska: Myrtie Hawk Treating Rebeccah Ivins/Extender: Melburn Hake, HOYT Weeks in Treatment: 59 Visit Information History Since Last Visit All ordered tests and consults were completed: No Patient Arrived: Ambulatory Added or deleted any medications: No Arrival Time: 08:39 Any new allergies or adverse reactions: No Accompanied By: self Had a fall or experienced change in No Transfer Assistance: None activities of daily living that may affect Patient Identification Verified: Yes risk of falls: Secondary Verification Process Completed: Yes Signs or symptoms of abuse/neglect since last visito No Patient Requires Transmission-Based No Hospitalized since last visit: No Precautions: Has Dressing in Place as Prescribed: Yes Patient Has Alerts: Yes Pain Present Now: Yes Patient Alerts: DMII Electronic Signature(s) Signed: 05/16/2017 9:17:12 AM By: Alric Quan Entered By: Alric Quan on 05/16/2017 09:17:12 [place], [name] ([id number]) -------------------------------------------------------------------------------- Encounter Discharge Information Details Patient Name: [name], [name] Date of Service: 05/16/2017 8:45 AM Medical Record Number: [id number] Patient Account Number: [id number] Date of Birth/Sex: 10/15/1950 (67 y.o. Male) Treating RN: Ahmed Prima Primary Care Salvador Coupe: Myrtie Hawk Other Clinician: Referring Dayanna Pryce: Myrtie Hawk Treating Sharnese Heath/Extender: Cathie Olden in Treatment: 43 Encounter Discharge Information Items Discharge Pain Level: 0 Discharge Condition:  Stable Ambulatory Status: Ambulatory Discharge Destination: Home Private Transportation: Auto Accompanied By: family Schedule Follow-up Appointment: Yes Medication Reconciliation completed and provided No to Patient/Care Sharan Mcenaney: Clinical Summary of Care: Electronic Signature(s) Signed: 05/16/2017 9:19:14 AM By: Alric Quan Entered By: Alric Quan on 05/16/2017 09:19:14 [place], [name] ([id number]) -------------------------------------------------------------------------------- Lower Extremity Assessment Details Patient Name: [name], Mackenzy E. Date of Service: 05/16/2017 8:45 AM Medical Record Number: [id number] Patient Account Number: [id number] Date of Birth/Sex: 12-26-1950 (67 y.o. Male) Treating RN: Ahmed Prima Primary Care Jakeob Tullis: Myrtie Hawk Other Clinician: Referring Hali Balgobin: Myrtie Hawk Treating Irianna Gilday/Extender: Cathie Olden in Treatment: 33 Vascular Assessment Pulses: Dorsalis Pedis Palpable: [Left:Yes] [Right:Yes] Posterior Tibial Extremity colors, hair growth, and conditions: Extremity Color: [Left:Hyperpigmented] [Right:Hyperpigmented] Temperature of Extremity: [Left:Warm] [Right:Warm] Capillary Refill: [Left:< 3 seconds] [Right:< 3 seconds] Toe Nail Assessment Left: Right: Thick: No No Discolored: Yes Yes Deformed: Yes Yes Improper Length and Hygiene: No No Electronic Signature(s) Signed: 05/16/2017 9:17:47 AM By: Alric Quan Entered By: Alric Quan on 05/16/2017 09:17:47 Soffer, Margaret E. ([id number]) -------------------------------------------------------------------------------- Multi Wound Chart Details Patient Name: [name], [name] Date of Service: 05/16/2017 8:45 AM Medical Record Number: [id number] Patient Account Number: [id number] Date of Birth/Sex: [birth year] (67 y.o. Male) Treating RN: Ahmed Prima Primary Care [place] Urieta: Myrtie Hawk Other Clinician: Referring Neko Mcgeehan: Myrtie Hawk Treating Chandrea Zellman/Extender: Cathie Olden in Treatment: 33 Vital Signs Height(in): 44 Pulse(bpm): 14 Weight(lbs): 168 Blood Pressure(mmHg): 162/88 Body Mass Index(BMI): 25 Temperature(F): 98.1 Respiratory Rate 18 (breaths/min): Photos: [1:No Photos] [11:No Photos] [2:No Photos] Wound Location: [1:Right Toe Great] [11:Right Calcaneus] [2:Right Toe Second] Wounding Event: [1:Gradually Appeared] [11:Gradually Appeared] [2:Gradually Appeared] Primary Etiology: [1:Diabetic Wound/Ulcer of the Lower Extremity] [11:Diabetic Wound/Ulcer of the Lower Extremity] [2:Diabetic Wound/Ulcer of the Lower Extremity] Comorbid History: [1:Anemia, Lymphedema, Congestive Heart Failure, Hypertension, Peripheral Venous Disease, Type II Diabetes, Neuropathy] [11:Anemia, Lymphedema, Congestive Heart Failure, Hypertension, Peripheral Venous Disease, Type II Diabetes,  Neuropathy] [2:Anemia, Lymphedema, Congestive Heart Failure, Hypertension, Peripheral Venous Disease, Type II Diabetes, Neuropathy] Date Acquired: [1:06/11/2016] [11:05/09/2017] [2:06/11/2016] Weeks of Treatment: [1:33] [11:0] [2:33]  Wound Status: [1:Open] [11:Open] [2:Open] Pending Amputation on [1:Yes] [11:No] [2:Yes] Presentation: Measurements L x W x D [1:1x6.5x0.1] [11:1x0.5x0.1] [2:1.5x2x0.1] (cm) Area (cm) : [1:5.105] [11:0.393] [2:2.356] Volume (cm) : [1:0.511] [11:0.039] [2:0.236] % Reduction in Area: [1:89.20%] [11:N/A] [2:23.30%] % Reduction in Volume: [1:89.20%] [11:N/A] [2:23.10%] Classification: [1:Grade 2] [11:Grade 2] [2:Grade 2] Exudate Amount: [1:Large] [11:Large] [2:Large] Exudate Type: [1:Serous] [11:Serosanguineous] [2:Serosanguineous] Exudate Color: [1:amber] [11:red, brown] [2:red, brown] Foul Odor After Cleansing: [1:Yes] [11:No] [2:Yes] Odor Anticipated Due to [1:No] [11:N/A] [2:No] Product Use: Wound Margin: [1:Flat and Intact] [11:Distinct, outline attached] [2:Flat and Intact] Granulation Amount:  [1:Medium (34-66%)] [11:Large (67-100%)] [2:Medium (34-66%)] Granulation Quality: [1:Red] [11:Red] [2:Red] Necrotic Amount: [1:Small (1-33%)] [11:Small (1-33%)] [2:Medium (34-66%)] Exposed Structures: [1:Fat Layer (Subcutaneous Tissue) Exposed: Yes Fascia: No Tendon: No Muscle: No] [11:Fascia: No Fat Layer (Subcutaneous Tissue) Exposed: No Tendon: No Muscle: No] [2:Fat Layer (Subcutaneous Tissue) Exposed: Yes Fascia: No Tendon: No Muscle: No] Joint: No Joint: No Joint: No Bone: No Bone: No Bone: No Epithelialization: None None None Debridement: Debridement ([id number]) Debridement ([id number]) N/A Pre-procedure 09:06 09:06 N/A Verification/Time Out Taken: Pain Control: Lidocaine 4% Topical Solution Lidocaine 4% Topical Solution N/A Tissue Debrided: Fibrin/Slough, Exudates, Fibrin/Slough, Exudates, N/A Subcutaneous Subcutaneous Level: Skin/Subcutaneous Tissue Skin/Subcutaneous Tissue N/A Debridement Area (sq cm): 1 0.5 N/A Instrument: Curette Curette N/A Bleeding: Minimum Minimum N/A Hemostasis Achieved: Pressure Pressure N/A Procedural Pain: 0 0 N/A Post Procedural Pain: 0 0 N/A Debridement Treatment Procedure was tolerated well Procedure was tolerated well N/A Response: Post Debridement 1x6.5x0.2 1x0.5x0.2 N/A Measurements L x W x D (cm) Post Debridement Volume: 1.021 0.079 N/A (cm) Periwound Skin Texture: Excoriation: Yes No Abnormalities Noted Excoriation: No Induration: No Induration: No Callus: No Callus: No Crepitus: No Crepitus: No Rash: No Rash: No Scarring: No Scarring: No Periwound Skin Moisture: Maceration: Yes No Abnormalities Noted Maceration: Yes Dry/Scaly: No Dry/Scaly: No Periwound Skin Color: Atrophie Blanche: No No Abnormalities Noted Atrophie Blanche: No Cyanosis: No Cyanosis: No Ecchymosis: No Ecchymosis: No Erythema: No Erythema: No Hemosiderin Staining: No Hemosiderin Staining: No Mottled: No Mottled: No Pallor: No Pallor:  No Rubor: No Rubor: No Temperature: No Abnormality No Abnormality No Abnormality Tenderness on Palpation: No Yes No Wound Preparation: Ulcer Cleansing: Ulcer Cleansing: Ulcer Cleansing: Rinsed/Irrigated with Saline Rinsed/Irrigated with Saline Rinsed/Irrigated with Saline Topical Anesthetic Applied: Topical Anesthetic Applied: Topical Anesthetic Applied: Other: lidocaine 4% Other: lidocaine 4% Other: lidocaine 4% Procedures Performed: Debridement Debridement N/A Wound Number: [id number]  Photos: No Photos No Photos No Photos Wound Location: Left Toe Great Left Toe Second Right Toe Third Wounding Event: Gradually Appeared Gradually Appeared Gradually Appeared Primary Etiology: Diabetic Wound/Ulcer of 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 Lower Extremity Lower Extremity Comorbid History: Anemia, Lymphedema, Anemia, Lymphedema, Anemia, Lymphedema, Congestive Heart Failure, Congestive Heart Failure, Congestive Heart Failure, Hypertension, Peripheral Hypertension, Peripheral Hypertension, Peripheral Morin, Lovell E. ([id number]) Venous Disease, Type II Venous Disease, Type II Venous Disease, Type II Diabetes, Neuropathy Diabetes, Neuropathy Diabetes, Neuropathy Date Acquired: 06/11/2016 06/11/2016 12/31/2016 Weeks of Treatment: 33 33 19 Wound Status: Open Open Open Pending Amputation on Yes Yes No Presentation: Measurements L x W x D 0.6x2.7x0.1 1.3x1.2x0.2 1.2x1.2x0.1 (cm) Area (cm) : 1.272 1.225 1.131 Volume (cm) : 0.127 0.245 0.113 % Reduction in Area: 91.10% 42.60% -187.80% % Reduction in Volume: 91.10% -14.50% -189.70% Classification: Grade 2 Grade 2 Grade 2 Exudate Amount: Large Large Large Exudate Type: Serosanguineous Serous Purulent Exudate Color: red, brown amber yellow, brown, green Foul Odor After Cleansing:  Yes Yes Yes Odor Anticipated Due to No No No Product Use: Wound Margin: Flat and Intact Flat and Intact Flat and  Intact Granulation Amount: Medium (34-66%) Large (67-100%) Medium (34-66%) Granulation Quality: Red Red Red Necrotic Amount: Small (1-33%) Small (1-33%) Medium (34-66%) Exposed Structures: Fat Layer (Subcutaneous Fat Layer (Subcutaneous Fat Layer (Subcutaneous Tissue) Exposed: Yes Tissue) Exposed: Yes Tissue) Exposed: Yes Fascia: No Fascia: No Fascia: No Tendon: No Tendon: No Tendon: No Muscle: No Muscle: No Muscle: No Joint: No Joint: No Joint: No Bone: No Bone: No Bone: No Epithelialization: None None None Debridement: N/A Debridement ([id number]) N/A Pre-procedure N/A 09:06 N/A Verification/Time Out Taken: Pain Control: N/A Lidocaine 4% Topical Solution N/A Tissue Debrided: N/A Fibrin/Slough, Exudates, N/A Subcutaneous Level: N/A Skin/Subcutaneous Tissue N/A Debridement Area (sq cm): N/A 1.56 N/A Instrument: N/A Curette N/A Bleeding: N/A Minimum N/A Hemostasis Achieved: N/A Pressure N/A Procedural Pain: N/A 0 N/A Post Procedural Pain: N/A 0 N/A Debridement Treatment N/A Procedure was tolerated well N/A Response: Post Debridement N/A 1.3x1.2x0.2 N/A Measurements L x W x D (cm) Post Debridement Volume: N/A 0.245 N/A (cm) Periwound Skin Texture: Excoriation: No Excoriation: No Excoriation: No Induration: No Induration: No Induration: No Callus: No Callus: No Callus: No Crepitus: No Crepitus: No Crepitus: No Gadson, Gilman E. ([id number]) Rash: No Rash: No Rash: No Scarring: No Scarring: No Scarring: No Periwound Skin Moisture: Maceration: Yes Maceration: Yes Maceration: Yes Dry/Scaly: No Dry/Scaly: No Dry/Scaly: No Periwound Skin Color: Atrophie Blanche: No Atrophie Blanche: No Atrophie Blanche: No Cyanosis: No Cyanosis: No Cyanosis: No Ecchymosis: No Ecchymosis: No Ecchymosis: No Erythema: No Erythema: No Erythema: No Hemosiderin Staining: No Hemosiderin Staining: No Hemosiderin Staining: No Mottled: No Mottled: No Mottled:  No Pallor: No Pallor: No Pallor: No Rubor: No Rubor: No Rubor: No Temperature: No Abnormality No Abnormality No Abnormality Tenderness on Palpation: No No No Wound Preparation: Ulcer Cleansing: Ulcer Cleansing: Ulcer Cleansing: Rinsed/Irrigated with Saline Rinsed/Irrigated with Saline Rinsed/Irrigated with Saline Topical Anesthetic Applied: Topical Anesthetic Applied: Topical Anesthetic Applied: Other: lidocaine 4% Other: lidocaine 4% Other: lidocaine 4% Procedures Performed: N/A Debridement N/A Treatment Notes Wound #1 (Right Toe Great) 1. Cleansed with: Clean wound with Normal Saline 2. Anesthetic Topical Lidocaine 4% cream to wound bed prior to debridement 4. Dressing Applied: Other dressing (specify in notes) 5. Secondary Dressing Applied Kerlix/Conform 7. Secured with Tape Notes silvercel Wound #11 (Right Calcaneus) 1. Cleansed with: Clean wound with Normal Saline 2. Anesthetic Topical Lidocaine 4% cream to wound bed prior to debridement 4. Dressing Applied: Other dressing (specify in notes) 5. Secondary Dressing Applied Kerlix/Conform 7. Secured with Tape Notes silvercel, heel cup Wound #2 (Right Toe Second) 1. Cleansed with: Clean wound with Normal Saline Razon, Kyi E. ([id number]) 2. Anesthetic Topical Lidocaine 4% cream to wound bed prior to debridement 4. Dressing Applied: Other dressing (specify in notes) 5. Secondary Dressing Applied Kerlix/Conform 7. Secured with Tape Notes silvercel Wound #5 (Left Toe Great) 1. Cleansed with: Clean wound with Normal Saline 2. Anesthetic Topical Lidocaine 4% cream to wound bed prior to debridement 4. Dressing Applied: Other dressing (specify in notes) 5. Secondary Dressing Applied Kerlix/Conform 7. Secured with Tape Notes silvercel Wound #6 (Left Toe Second) 1. Cleansed with: Clean wound with Normal Saline 2. Anesthetic Topical Lidocaine 4% cream to wound bed prior to debridement 4. Dressing  Applied: Other dressing (specify in notes) 5. Secondary Dressing Applied Kerlix/Conform 7. Secured with Tape Notes silvercel Wound #9 (Right Toe Third) 1. Cleansed with: Clean wound with Normal Saline  2. Anesthetic Topical Lidocaine 4% cream to wound bed prior to debridement 4. Dressing Applied: Other dressing (specify in notes) 5. Secondary Dressing Applied Kerlix/Conform 7. Secured with Tape Notes Nicasio, HAMISH BANKS ([id number]) silvercel Electronic Signature(s) Signed: 05/16/2017 9:51:26 AM By: Lawanda Cousins Previous Signature: 05/16/2017 9:18:04 AM Version By: Alric Quan Entered By: Lawanda Cousins on 05/16/2017 09:51:26 [place], [name] ([id number]) -------------------------------------------------------------------------------- [place] Details Patient Name: Ortega, Jedaiah E. Date of Service: 05/16/2017 8:45 AM Medical Record Number: [id number] Patient Account Number: [id number] Date of Birth/Sex: [birth year] (67 y.o. Male) Treating RN: Ahmed Prima Primary Care Kaitlynd Phillips: Myrtie Hawk Other Clinician: Referring Jarmon Javid: Myrtie Hawk Treating Rylea Selway/Extender: Cathie Olden in Treatment: 45 Active Inactive ` Abuse / Safety / Falls / Self Care Management Nursing Diagnoses: Potential for falls Goals: Patient will remain injury free related to falls Date Initiated: 09/21/2016 Target Resolution Date: 11/30/2016 Goal Status: Active Interventions: Assess fall risk on admission and as needed Notes: ` Nutrition Nursing Diagnoses: Potential for alteratiion in Nutrition/Potential for imbalanced nutrition Goals: Patient/caregiver agrees to and verbalizes understanding of need to use nutritional supplements and/or vitamins as prescribed Date Initiated: 09/21/2016 Target Resolution Date: 11/30/2016 Goal Status: Active Interventions: Assess patient nutrition upon admission and as needed per policy Notes: ` Orientation to the  Wound Care Program Nursing Diagnoses: Knowledge deficit related to the wound healing center program Goals: Patient/caregiver will verbalize understanding of the [place] Date Initiated: 09/21/2016 Target Resolution Date: 11/30/2016 Goal Status: Active Interventions: DARKRyan, Ogborn ([id number]) Provide education on orientation to the wound center Notes: ` Wound/Skin Impairment Nursing Diagnoses: Knowledge deficit related to smoking impact on wound healing Goals: Ulcer/skin breakdown will have a volume reduction of 30% by week 4 Date Initiated: 09/21/2016 Target Resolution Date: 11/30/2016 Goal Status: Active Ulcer/skin breakdown will have a volume reduction of 50% by week 8 Date Initiated: 09/21/2016 Target Resolution Date: 11/30/2016 Goal Status: Active Ulcer/skin breakdown will have a volume reduction of 80% by week 12 Date Initiated: 09/21/2016 Target Resolution Date: 11/30/2016 Goal Status: Active Ulcer/skin breakdown will heal within 14 weeks Date Initiated: 09/21/2016 Target Resolution Date: 11/30/2016 Goal Status: Active Interventions: Assess patient/caregiver ability to obtain necessary supplies Assess patient/caregiver ability to perform ulcer/skin care regimen upon admission and as needed Assess ulceration(s) every visit Notes: Electronic Signature(s) Signed: 05/16/2017 9:17:55 AM By: Alric Quan Entered By: Alric Quan on 05/16/2017 09:17:54 Moorer, Tan E. ([id number]) -------------------------------------------------------------------------------- Pain Assessment Details Patient Name: Gomm, Obbie E. Date of Service: 05/16/2017 8:45 AM Medical Record Number: [id number] Patient Account Number: [id number] Date of Birth/Sex: 1951/02/02 (67 y.o. Male) Treating RN: Ahmed Prima Primary Care Dreyden Rohrman: Myrtie Hawk Other Clinician: Referring Rema Lievanos: Myrtie Hawk Treating Kaylum Shrum/Extender: Melburn Hake, HOYT Weeks in  Treatment: 33 Active Problems Location of Pain Severity and Description of Pain Patient Has Paino Yes Site Locations Pain Location: Pain in Ulcers Rate the pain. Current Pain Level: 6 Character of Pain Describe the Pain: Aching, Burning Pain Management and Medication Current Pain Management: Notes Topical or injectable lidocaine is offered to patient for acute pain when surgical debridement is performed. If needed, Patient is instructed to use over the counter pain medication for the following 24-48 hours after debridement. Wound care MDs do not prescribed pain medications. Patient has chronic pain or uncontrolled pain. Patient has been instructed to make an appointment with their Primary Care Physician for pain management. Electronic Signature(s) Signed: 05/16/2017 9:17:22 AM By: Alric Quan Entered By: Alric Quan on 05/16/2017 09:17:22 Hoecker, [name] ([id number]) -------------------------------------------------------------------------------- Patient/Caregiver  Education Details Patient Name: Willaims, SHARON STAPEL. Date of Service: 05/16/2017 8:45 AM Medical Record Number: [id number] Patient Account Number: [id number] Date of Birth/Gender: 15-Mar-1951 (67 y.o. Male) Treating RN: Ahmed Prima Primary Care Physician: Myrtie Hawk Other Clinician: Referring Physician: Myrtie Hawk Treating Physician/Extender: Cathie Olden in Treatment: 57 Education Assessment Education Provided To: Patient Education Topics Provided Wound/Skin Impairment: Handouts: Caring for Your Ulcer, Other: change dressing as ordered Methods: Demonstration, Explain/Verbal Responses: State content correctly Electronic Signature(s) Signed: 05/16/2017 3:16:44 PM By: Alric Quan Entered By: Alric Quan on 05/16/2017 09:19:30 Augusta, Keo E. ([id number]) -------------------------------------------------------------------------------- Wound Assessment Details Patient Name:  Pamer, Mozes E. Date of Service: 05/16/2017 8:45 AM Medical Record Number: [id number] Patient Account Number: [id number] Date of Birth/Sex: May 04, 1950 (67 y.o. Male) Treating RN: Ahmed Prima Primary Care Davion Flannery: Myrtie Hawk Other Clinician: Referring Willye Javier: Myrtie Hawk Treating Brendan Gadson/Extender: Melburn Hake, HOYT Weeks in Treatment: 33 Wound Status Wound Number: 1 Primary Diabetic Wound/Ulcer of the Lower Extremity Etiology: Wound Location: Right Toe Great Wound Open Wounding Event: Gradually Appeared Status: Date Acquired: 06/11/2016 Comorbid Anemia, Lymphedema, Congestive Heart Weeks Of Treatment: 33 History: Failure, Hypertension, Peripheral Venous Clustered Wound: No Disease, Type II Diabetes, Neuropathy Pending Amputation On Presentation Photos Photo Uploaded By: Alric Quan on 05/16/2017 09:54:59 Wound Measurements Length: (cm) 1 Width: (cm) 6.5 Depth: (cm) 0.1 Area: (cm) 5.105 Volume: (cm) 0.511 % Reduction in Area: 89.2% % Reduction in Volume: 89.2% Epithelialization: None Tunneling: No Undermining: No Wound Description Classification: Grade 2 Wound Margin: Flat and Intact Exudate Amount: Large Exudate Type: Serous Exudate Color: amber Foul Odor After Cleansing: Yes Due to Product Use: No Slough/Fibrino No Wound Bed Granulation Amount: Medium (34-66%) Exposed Structure Granulation Quality: Red Fascia Exposed: No Necrotic Amount: Small (1-33%) Fat Layer (Subcutaneous Tissue) Exposed: Yes Necrotic Quality: Adherent Slough Tendon Exposed: No Muscle Exposed: No Joint Exposed: No Bone Exposed: No Periwound Skin Texture Ferch, Ruben E. ([id number]) Texture Color No Abnormalities Noted: No No Abnormalities Noted: No Callus: No Atrophie Blanche: No Crepitus: No Cyanosis: No Excoriation: Yes Ecchymosis: No Induration: No Erythema: No Rash: No Hemosiderin Staining: No Scarring: No Mottled: No Pallor:  No Moisture Rubor: No No Abnormalities Noted: No Dry / Scaly: No Temperature / Pain Maceration: Yes Temperature: No Abnormality Wound Preparation Ulcer Cleansing: Rinsed/Irrigated with Saline Topical Anesthetic Applied: Other: lidocaine 4%, Treatment Notes Wound #1 (Right Toe Great) 1. Cleansed with: Clean wound with Normal Saline 2. Anesthetic Topical Lidocaine 4% cream to wound bed prior to debridement 4. Dressing Applied: Other dressing (specify in notes) 5. Secondary Dressing Applied Kerlix/Conform 7. Secured with Tape Notes silvercel Electronic Signature(s) Signed: 05/16/2017 3:16:44 PM By: Alric Quan Entered By: Alric Quan on 05/16/2017 08:48:08 Sandate, Adolfo Johnette Abraham ([id number]) -------------------------------------------------------------------------------- Wound Assessment Details Patient Name: Nish, Pawan E. Date of Service: 05/16/2017 8:45 AM Medical Record Number: [id number] Patient Account Number: [id number] Date of Birth/Sex: 05-24-1950 (67 y.o. Male) Treating RN: Ahmed Prima Primary Care Alistair Senft: Myrtie Hawk Other Clinician: Referring Trayson Stitely: Myrtie Hawk Treating Shaquasha Gerstel/Extender: Melburn Hake, HOYT Weeks in Treatment: 33 Wound Status Wound Number: 11 Primary Diabetic Wound/Ulcer of the Lower Extremity Etiology: Wound Location: Right Calcaneus Wound Open Wounding Event: Gradually Appeared Status: Date Acquired: 05/09/2017 Comorbid Anemia, Lymphedema, Congestive Heart Weeks Of Treatment: 0 History: Failure, Hypertension, Peripheral Venous Clustered Wound: No Disease, Type II Diabetes, Neuropathy Photos Photo Uploaded By: Alric Quan on 05/16/2017 09:55:27 Wound Measurements Length: (cm) 1 Width: (cm) 0.5 Depth: (cm) 0.1 Area: (cm) 0.393 Volume: (cm) 0.039 % Reduction in Area: % Reduction  in Volume: Epithelialization: None Undermining: No Wound Description Classification: Grade 2 Wound Margin: Distinct,  outline attached Exudate Amount: Large Exudate Type: Serosanguineous Exudate Color: red, brown Foul Odor After Cleansing: No Slough/Fibrino Yes Wound Bed Granulation Amount: Large (67-100%) Exposed Structure Granulation Quality: Red Fascia Exposed: No Necrotic Amount: Small (1-33%) Fat Layer (Subcutaneous Tissue) Exposed: No Necrotic Quality: Adherent Slough Tendon Exposed: No Muscle Exposed: No Joint Exposed: No Bone Exposed: No Periwound Skin Texture Greenwell, Issam E. ([id number]) Texture Color No Abnormalities Noted: No No Abnormalities Noted: No Moisture Temperature / Pain No Abnormalities Noted: No Temperature: No Abnormality Tenderness on Palpation: Yes Wound Preparation Ulcer Cleansing: Rinsed/Irrigated with Saline Topical Anesthetic Applied: Other: lidocaine 4%, Treatment Notes Wound #11 (Right Calcaneus) 1. Cleansed with: Clean wound with Normal Saline 2. Anesthetic Topical Lidocaine 4% cream to wound bed prior to debridement 4. Dressing Applied: Other dressing (specify in notes) 5. Secondary Dressing Applied Kerlix/Conform 7. Secured with Tape Notes silvercel, heel cup Electronic Signature(s) Signed: 05/16/2017 3:16:44 PM By: Alric Quan Entered By: Alric Quan on 05/16/2017 08:47:01 Wurster, [name] ([id number]) -------------------------------------------------------------------------------- Wound Assessment Details Patient Name: Shoe, Bruin E. Date of Service: 05/16/2017 8:45 AM Medical Record Number: [id number] Patient Account Number: [id number] Date of Birth/Sex: [birth year] (67 y.o. Male) Treating RN: Ahmed Prima Primary Care Richrd Kuzniar: Myrtie Hawk Other Clinician: Referring Itay Mella: Myrtie Hawk Treating Adjoa Althouse/Extender: STONE III, HOYT Weeks in Treatment: 33 Wound Status Wound Number: 2 Primary Diabetic Wound/Ulcer of the Lower Extremity Etiology: Wound Location: Right Toe Second Wound Open Wounding Event:  Gradually Appeared Status: Date Acquired: 06/11/2016 Comorbid Anemia, Lymphedema, Congestive Heart Weeks Of Treatment: 33 History: Failure, Hypertension, Peripheral Venous Clustered Wound: No Disease, Type II Diabetes, Neuropathy Pending Amputation On Presentation Photos Photo Uploaded By: Alric Quan on 05/16/2017 09:55:28 Wound Measurements Length: (cm) 1.5 Width: (cm) 2 Depth: (cm) 0.1 Area: (cm) 2.356 Volume: (cm) 0.236 % Reduction in Area: 23.3% % Reduction in Volume: 23.1% Epithelialization: None Tunneling: No Undermining: No Wound Description Classification: Grade 2 Wound Margin: Flat and Intact Exudate Amount: Large Exudate Type: Serosanguineous Exudate Color: red, brown Foul Odor After Cleansing: Yes Due to Product Use: No Slough/Fibrino No Wound Bed Granulation Amount: Medium (34-66%) Exposed Structure Granulation Quality: Red Fascia Exposed: No Necrotic Amount: Medium (34-66%) Fat Layer (Subcutaneous Tissue) Exposed: Yes Necrotic Quality: Adherent Slough Tendon Exposed: No Muscle Exposed: No Joint Exposed: No Bone Exposed: No Periwound Skin Texture Muench, Wendel E. ([id number]) Texture Color No Abnormalities Noted: No No Abnormalities Noted: No Callus: No Atrophie Blanche: No Crepitus: No Cyanosis: No Excoriation: No Ecchymosis: No Induration: No Erythema: No Rash: No Hemosiderin Staining: No Scarring: No Mottled: No Pallor: No Moisture Rubor: No No Abnormalities Noted: No Dry / Scaly: No Temperature / Pain Maceration: Yes Temperature: No Abnormality Wound Preparation Ulcer Cleansing: Rinsed/Irrigated with Saline Topical Anesthetic Applied: Other: lidocaine 4%, Treatment Notes Wound #2 (Right Toe Second) 1. Cleansed with: Clean wound with Normal Saline 2. Anesthetic Topical Lidocaine 4% cream to wound bed prior to debridement 4. Dressing Applied: Other dressing (specify in notes) 5. Secondary Dressing  Applied Kerlix/Conform 7. Secured with Tape Notes silvercel Electronic Signature(s) Signed: 05/16/2017 3:16:44 PM By: Alric Quan Entered By: Alric Quan on 05/16/2017 08:48:46 [place], [name] ([id number]) -------------------------------------------------------------------------------- Wound Assessment Details Patient Name: Mcarthy, Ad E. Date of Service: 05/16/2017 8:45 AM Medical Record Number: [id number] Patient Account Number: [id number] Date of Birth/Sex: [birth year] (67 y.o. Male) Treating RN: Ahmed Prima Primary Care Pema Thomure: Myrtie Hawk Other Clinician: Referring Hiroko Tregre: Myrtie Hawk Treating Vong Garringer/Extender:  STONE III, HOYT Weeks in Treatment: 33 Wound Status Wound Number: 5 Primary Diabetic Wound/Ulcer of the Lower Extremity Etiology: Wound Location: Left Toe Great Wound Open Wounding Event: Gradually Appeared Status: Date Acquired: 06/11/2016 Comorbid Anemia, Lymphedema, Congestive Heart Weeks Of Treatment: 33 History: Failure, Hypertension, Peripheral Venous Clustered Wound: No Disease, Type II Diabetes, Neuropathy Pending Amputation On Presentation Photos Photo Uploaded By: Alric Quan on 05/16/2017 09:56:15 Wound Measurements Length: (cm) 0.6 Width: (cm) 2.7 Depth: (cm) 0.1 Area: (cm) 1.272 Volume: (cm) 0.127 % Reduction in Area: 91.1% % Reduction in Volume: 91.1% Epithelialization: None Tunneling: No Undermining: No Wound Description Classification: Grade 2 Wound Margin: Flat and Intact Exudate Amount: Large Exudate Type: Serosanguineous Exudate Color: red, brown Foul Odor After Cleansing: Yes Due to Product Use: No Slough/Fibrino No Wound Bed Granulation Amount: Medium (34-66%) Exposed Structure Granulation Quality: Red Fascia Exposed: No Necrotic Amount: Small (1-33%) Fat Layer (Subcutaneous Tissue) Exposed: Yes Necrotic Quality: Adherent Slough Tendon Exposed: No Muscle Exposed: No Joint  Exposed: No Bone Exposed: No Periwound Skin Texture Granja, Kordel E. ([id number]) Texture Color No Abnormalities Noted: No No Abnormalities Noted: No Callus: No Atrophie Blanche: No Crepitus: No Cyanosis: No Excoriation: No Ecchymosis: No Induration: No Erythema: No Rash: No Hemosiderin Staining: No Scarring: No Mottled: No Pallor: No Moisture Rubor: No No Abnormalities Noted: No Dry / Scaly: No Temperature / Pain Maceration: Yes Temperature: No Abnormality Wound Preparation Ulcer Cleansing: Rinsed/Irrigated with Saline Topical Anesthetic Applied: Other: lidocaine 4%, Treatment Notes Wound #5 (Left Toe Great) 1. Cleansed with: Clean wound with Normal Saline 2. Anesthetic Topical Lidocaine 4% cream to wound bed prior to debridement 4. Dressing Applied: Other dressing (specify in notes) 5. Secondary Dressing Applied Kerlix/Conform 7. Secured with Tape Notes silvercel Electronic Signature(s) Signed: 05/16/2017 3:16:44 PM By: Alric Quan Entered By: Alric Quan on 05/16/2017 08:49:25 Lobdell, [name] ([id number]) -------------------------------------------------------------------------------- Wound Assessment Details Patient Name: Casady, Jesten E. Date of Service: 05/16/2017 8:45 AM Medical Record Number: [id number] Patient Account Number: [id number] Date of Birth/Sex: March 21, 1951 (67 y.o. Male) Treating RN: Ahmed Prima Primary Care Sherri Levenhagen: Myrtie Hawk Other Clinician: Referring Enes Wegener: Myrtie Hawk Treating Verl Whitmore/Extender: Melburn Hake, HOYT Weeks in Treatment: 33 Wound Status Wound Number: 6 Primary Diabetic Wound/Ulcer of the Lower Extremity Etiology: Wound Location: Left Toe Second Wound Open Wounding Event: Gradually Appeared Status: Date Acquired: 06/11/2016 Comorbid Anemia, Lymphedema, Congestive Heart Weeks Of Treatment: 33 History: Failure, Hypertension, Peripheral Venous Clustered Wound: No Disease, Type II  Diabetes, Neuropathy Pending Amputation On Presentation Photos Photo Uploaded By: Alric Quan on 05/16/2017 09:56:16 Wound Measurements Length: (cm) 1.3 Width: (cm) 1.2 Depth: (cm) 0.2 Area: (cm) 1.225 Volume: (cm) 0.245 % Reduction in Area: 42.6% % Reduction in Volume: -14.5% Epithelialization: None Tunneling: No Undermining: No Wound Description Classification: Grade 2 Wound Margin: Flat and Intact Exudate Amount: Large Exudate Type: Serous Exudate Color: amber Foul Odor After Cleansing: Yes Due to Product Use: No Slough/Fibrino No Wound Bed Granulation Amount: Large (67-100%) Exposed Structure Granulation Quality: Red Fascia Exposed: No Necrotic Amount: Small (1-33%) Fat Layer (Subcutaneous Tissue) Exposed: Yes Necrotic Quality: Adherent Slough Tendon Exposed: No Muscle Exposed: No Joint Exposed: No Bone Exposed: No Periwound Skin Texture Hreha, Yiannis E. ([id number]) Texture Color No Abnormalities Noted: No No Abnormalities Noted: No Callus: No Atrophie Blanche: No Crepitus: No Cyanosis: No Excoriation: No Ecchymosis: No Induration: No Erythema: No Rash: No Hemosiderin Staining: No Scarring: No Mottled: No Pallor: No Moisture Rubor: No No Abnormalities Noted: No Dry / Scaly: No Temperature / Pain  Maceration: Yes Temperature: No Abnormality Wound Preparation Ulcer Cleansing: Rinsed/Irrigated with Saline Topical Anesthetic Applied: Other: lidocaine 4%, Treatment Notes Wound #6 (Left Toe Second) 1. Cleansed with: Clean wound with Normal Saline 2. Anesthetic Topical Lidocaine 4% cream to wound bed prior to debridement 4. Dressing Applied: Other dressing (specify in notes) 5. Secondary Dressing Applied Kerlix/Conform 7. Secured with Tape Notes silvercel Electronic Signature(s) Signed: 05/16/2017 3:16:44 PM By: Alric Quan Entered By: Alric Quan on 05/16/2017 08:49:55 Flye, [name]  ([id number]) -------------------------------------------------------------------------------- Wound Assessment Details Patient Name: Madero, Josephine E. Date of Service: 05/16/2017 8:45 AM Medical Record Number: [id number] Patient Account Number: [id number] Date of Birth/Sex: 09-19-1950 (67 y.o. Male) Treating RN: Ahmed Prima Primary Care Porschea Borys: Myrtie Hawk Other Clinician: Referring Assyria Morreale: Myrtie Hawk Treating Marisal Swarey/Extender: Melburn Hake, HOYT Weeks in Treatment: 33 Wound Status Wound Number: 9 Primary Diabetic Wound/Ulcer of the Lower Extremity Etiology: Wound Location: Right Toe Third Wound Open Wounding Event: Gradually Appeared Status: Date Acquired: 12/31/2016 Comorbid Anemia, Lymphedema, Congestive Heart Weeks Of Treatment: 19 History: Failure, Hypertension, Peripheral Venous Clustered Wound: No Disease, Type II Diabetes, Neuropathy Photos Photo Uploaded By: Alric Quan on 05/16/2017 09:56:34 Wound Measurements Length: (cm) 1.2 Width: (cm) 1.2 Depth: (cm) 0.1 Area: (cm) 1.131 Volume: (cm) 0.113 % Reduction in Area: -187.8% % Reduction in Volume: -189.7% Epithelialization: None Wound Description Classification: Grade 2 Wound Margin: Flat and Intact Exudate Amount: Large Exudate Type: Purulent Exudate Color: yellow, brown, green Foul Odor After Cleansing: Yes Due to Product Use: No Slough/Fibrino Yes Wound Bed Granulation Amount: Medium (34-66%) Exposed Structure Granulation Quality: Red Fascia Exposed: No Necrotic Amount: Medium (34-66%) Fat Layer (Subcutaneous Tissue) Exposed: Yes Necrotic Quality: Adherent Slough Tendon Exposed: No Muscle Exposed: No Joint Exposed: No Bone Exposed: No Periwound Skin Texture Licausi, Evans E. ([id number]) Texture Color No Abnormalities Noted: No No Abnormalities Noted: No Callus: No Atrophie Blanche: No Crepitus: No Cyanosis: No Excoriation: No Ecchymosis: No Induration:  No Erythema: No Rash: No Hemosiderin Staining: No Scarring: No Mottled: No Pallor: No Moisture Rubor: No No Abnormalities Noted: No Dry / Scaly: No Temperature / Pain Maceration: Yes Temperature: No Abnormality Wound Preparation Ulcer Cleansing: Rinsed/Irrigated with Saline Topical Anesthetic Applied: Other: lidocaine 4%, Treatment Notes Wound #9 (Right Toe Third) 1. Cleansed with: Clean wound with Normal Saline 2. Anesthetic Topical Lidocaine 4% cream to wound bed prior to debridement 4. Dressing Applied: Other dressing (specify in notes) 5. Secondary Dressing Applied Kerlix/Conform 7. Secured with Tape Notes silvercel Electronic Signature(s) Signed: 05/16/2017 3:16:44 PM By: Alric Quan Entered By: Alric Quan on 05/16/2017 08:50:40 Macfadden, [name] ([id number]) -------------------------------------------------------------------------------- Vitals Details Patient Name: Monterosso, Montavious E. Date of Service: 05/16/2017 8:45 AM Medical Record Number: [id number] Patient Account Number: [id number] Date of Birth/Sex: [birth year] (67 y.o. Male) Treating RN: Ahmed Prima Primary Care Wendall Isabell: Myrtie Hawk Other Clinician: Referring Terrina Docter: Myrtie Hawk Treating Amauri Medellin/Extender: Melburn Hake, HOYT Weeks in Treatment: 33 Vital Signs Time Taken: 08:41 Temperature (F): 98.1 Height (in): 69 Pulse (bpm): 62 Weight (lbs): 168 Respiratory Rate (breaths/min): 18 Body Mass Index (BMI): 24.8 Blood Pressure (mmHg): 162/88 Reference Range: 80 - 120 mg / dl Electronic Signature(s) Signed: 05/16/2017 9:17:33 AM By: Alric Quan Entered By: Alric Quan on 05/16/2017 09:17:33

## 2017-05-20 NOTE — Progress Notes (Signed)
ZAFIR, SCHAUER ([id number]) Visit Report for 05/16/2017 Chief Complaint Document Details Patient Name: [name], [name] Date of Service: 05/16/2017 8:45 AM Medical Record Number: [id number] Patient Account Number: [id number] Date of Birth/Sex: [birth year] (67 y.o. Male) Treating RN: Ahmed Prima Primary Care Provider: Myrtie Hawk Other Clinician: Referring Provider: Myrtie Hawk Treating Provider/Extender: Cathie Olden in Treatment: 41 Information Obtained from: Patient Chief Complaint Patient presents for treatment of an open diabetic ulcer to both feet Electronic Signature(s) Signed: 05/16/2017 10:00:47 AM By: Lawanda Cousins Entered By: Lawanda Cousins on 05/16/2017 10:00:47 Leighton, [name] ([id number]) -------------------------------------------------------------------------------- Debridement Details Patient Name: [name], [name] Date of Service: 05/16/2017 8:45 AM Medical Record Number: [id number] Patient Account Number: [id number] Date of Birth/Sex: 12-10-1950 (67 y.o. Male) Treating RN: Ahmed Prima Primary Care Provider: Myrtie Hawk Other Clinician: Referring Provider: Myrtie Hawk Treating Provider/Extender: Melburn Hake, HOYT Weeks in Treatment: 33 Debridement Performed for Wound #1 Right Toe Great Assessment: Performed By: Physician STONE III, HOYT [name]., PA-C Debridement: Debridement Severity of Tissue Pre Fat layer exposed Debridement: Pre-procedure Verification/Time Yes - 09:06 Out Taken: Start Time: 09:07 Pain Control: Lidocaine 4% Topical Solution Level: Skin/Subcutaneous Tissue Total Area Debrided (L x W): 1 (cm) x 1 (cm) = 1 (cm) Tissue and other material Viable, Non-Viable, Exudate, Fibrin/Slough, Subcutaneous debrided: Instrument: Curette Bleeding: Minimum Hemostasis Achieved: Pressure End Time: 09:08 Procedural Pain: 0 Post Procedural Pain: 0 Response to Treatment: Procedure was tolerated well Post  Debridement Measurements of Total Wound Length: (cm) 1 Width: (cm) 6.5 Depth: (cm) 0.2 Volume: (cm) 1.021 Character of Wound/Ulcer Post Debridement: Requires Further Debridement Severity of Tissue Post Debridement: Fat layer exposed Post Procedure Diagnosis Same as Pre-procedure Electronic Signature(s) Signed: 05/16/2017 3:16:44 PM By: Alric Quan Signed: 05/20/2017 9:06:28 AM By: Worthy Keeler PA-C Entered By: Alric Quan on 05/16/2017 09:08:15 Glantz, Caige [name]. ([id number]) -------------------------------------------------------------------------------- Debridement Details Patient Name: [name], [name] Date of Service: 05/16/2017 8:45 AM Medical Record Number: [id number] Patient Account Number: [id number] Date of Birth/Sex: 04-27-1950 (67 y.o. Male) Treating RN: Ahmed Prima Primary Care Provider: Myrtie Hawk Other Clinician: Referring Provider: Myrtie Hawk Treating Provider/Extender: Melburn Hake, HOYT Weeks in Treatment: 33 Debridement Performed for Wound #6 Left Toe Second Assessment: Performed By: Physician STONE III, HOYT [name]., PA-C Debridement: Debridement Severity of Tissue Pre Fat layer exposed Debridement: Pre-procedure Verification/Time Yes - 09:06 Out Taken: Start Time: 09:08 Pain Control: Lidocaine 4% Topical Solution Level: Skin/Subcutaneous Tissue Total Area Debrided (L x W): 1.3 (cm) x 1.2 (cm) = 1.56 (cm) Tissue and other material Viable, Non-Viable, Exudate, Fibrin/Slough, Subcutaneous debrided: Instrument: Curette Bleeding: Minimum Hemostasis Achieved: Pressure End Time: 09:09 Procedural Pain: 0 Post Procedural Pain: 0 Response to Treatment: Procedure was tolerated well Post Debridement Measurements of Total Wound Length: (cm) 1.3 Width: (cm) 1.2 Depth: (cm) 0.2 Volume: (cm) 0.245 Character of Wound/Ulcer Post Debridement: Requires Further Debridement Severity of Tissue Post Debridement: Fat layer exposed Post  Procedure Diagnosis Same as Pre-procedure Electronic Signature(s) Signed: 05/16/2017 3:16:44 PM By: Alric Quan Signed: 05/20/2017 9:06:28 AM By: Worthy Keeler PA-C Entered By: Alric Quan on 05/16/2017 09:09:10 [name], [name] ([id number]) -------------------------------------------------------------------------------- Debridement Details Patient Name: [name], [name] Date of Service: 05/16/2017 8:45 AM Medical Record Number: [id number] Patient Account Number: [id number] Date of Birth/Sex: [birth year] (67 y.o. Male) Treating RN: Ahmed Prima Primary Care Provider: Myrtie Hawk Other Clinician: Referring Provider: Myrtie Hawk Treating Provider/Extender: Melburn Hake, HOYT Weeks in Treatment: 33 Debridement Performed for Wound #11 Right Calcaneus Assessment: Performed By: Physician STONE III, HOYT  [name]., PA-C Debridement: Debridement Severity of Tissue Pre Fat layer exposed Debridement: Pre-procedure Verification/Time Yes - 09:06 Out Taken: Start Time: 09:09 Pain Control: Lidocaine 4% Topical Solution Level: Skin/Subcutaneous Tissue Total Area Debrided (L x W): 1 (cm) x 0.5 (cm) = 0.5 (cm) Tissue and other material Viable, Non-Viable, Exudate, Fibrin/Slough, Subcutaneous debrided: Instrument: Curette Bleeding: Minimum Hemostasis Achieved: Pressure End Time: 09:10 Procedural Pain: 0 Post Procedural Pain: 0 Response to Treatment: Procedure was tolerated well Post Debridement Measurements of Total Wound Length: (cm) 1 Width: (cm) 0.5 Depth: (cm) 0.2 Volume: (cm) 0.079 Character of Wound/Ulcer Post Debridement: Requires Further Debridement Severity of Tissue Post Debridement: Fat layer exposed Post Procedure Diagnosis Same as Pre-procedure Electronic Signature(s) Signed: 05/16/2017 3:16:44 PM By: Alric Quan Signed: 05/20/2017 9:06:28 AM By: Worthy Keeler PA-C Entered By: Alric Quan on 05/16/2017 09:10:15 Starner, [name]  ([id number]) -------------------------------------------------------------------------------- HPI Details Patient Name: [name], [name] Date of Service: 05/16/2017 8:45 AM Medical Record Number: [id number] Patient Account Number: [id number] Date of Birth/Sex: [birth year] (67 y.o. Male) Treating RN: Ahmed Prima Primary Care Provider: Myrtie Hawk Other Clinician: Referring Provider: Myrtie Hawk Treating Provider/Extender: Cathie Olden in Treatment: 41 History of Present Illness Location: bilateral feet ulceration on the toes Quality: Patient reports experiencing a dull pain to affected area(s). Severity: Patient states wound are getting better Duration: Patient has had the wound for > 3 months prior to seeking treatment at the wound center Timing: Pain in wound is constant (hurts all the time) Context: The wound would happen gradually Modifying Factors: Other treatment(s) tried include:treatment for lymphedema and is seen by the podiatrist Dr. Caryl Comes Associated Signs and Symptoms: Patient reports having increase swelling. HPI Description: 67 year old patient here to see as for bilateral feet ulceration to on his left first and second toe and 2 on his right first and second toe, which she's had for about 4 months. 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 Donella Stade. The patient is also undergoing workup by medical oncology for a intra-abdominal lymphadenopathy. 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 He is also status post appendectomy, inguinal lymph node biopsy, rectal biopsy and rectal examination under anesthesia. he currently smokes cigarettes about half packet a day. In March of this year he was seen by Dr.  Hortencia Pilar, for evaluation of bilateral varicose veins and besides wearing compression stockings he had recommended laser ablation of the right and left great saphenous veins to eleviate the symptoms and complications of severe superficial venous reflux disease. He also recommended lymphedema pumps for better control of his lymphedema. The patient recently has had on 08/23/2016, right greater saphenous vein ablation with the laser energy Earlier lower extremity venous reflux examination done on 05/08/2016 showed no DVT or SVT both lower legs but incompetence of bilateral great saphenous veins was present. A lower arterial study was also done and there was no significant right lower and left lower extremity problems based on a normal toe brachial index bilaterally and the ABI was 1.21 the left and 1.23 on the right. His post ablation venous duplex examination showed successful ablation of the right GS vein with thrombus formation 2 below the right saphenofemoral junction. The deep system was patent without evidence of thrombosis and this was done on 08/30/2016. the patient  also has a squamous cell cancer of the skin of the buttock and is recently undergone radiation therapy for this prior to excisional surgery. Addendum: regarding his x-rays done today and x-ray of the left foot -- IMPRESSION: No objective evidence of osteomyelitis. There are soft tissue changes which may reflect cellulitis. X-ray of the right foot -- IMPRESSION:Findings compatible with cellulitis of the toes. No objective evidence of osteomyelitis is observed. 10/01/16 on evaluation today patient's wounds appeared to be doing some better. I did review the x-rays as well which showed no evidence of osteomyelitis although there was evidence on x-ray of cellulitis. He fortunately is not having any discomfort although he continues to have some swelling. He does not remember being on any antibiotics recently. 10/15/16 on evaluation  today patient's wounds overall appear to be doing better although he does have a new location noted on the left foot. Fortunately he is not having significant pain. It almost has the appearance that something is rubbing on the end of his toes but he wears the open toe shoes and according to what he is telling me never wears anything that would rub on his foot. There is no evidence of infection and specifically no evidence of a fungal infection 10/22/16 On evaluation today patient's wounds appeared to be doing better compared to last week in regard to his bilateral Penning, Ryker [name]. ([id number]) lower extremities. Fortunately I happy with how things are progressing although he still has ulcers I feel like that he is improving and appropriate manner. 11/12/16 on evaluation today patient appears to be doing well in regard to his bilateral feet and the respective wounds. We have been using surrounding her dressings along with an antifungal cream which seems to be doing very well. He has no bilateral dysfunction noticed that the rituals are weight loss at this point. He also has no nausea or vomiting a note purulent discharge. He did see Vein and vascular today and he tells me that they told him he could have surgery for his venous stasis but they did not feel like it was worth it in his words. Fortunately patient's wounds do appear to be getting sneakily better. 11/26/2016 -- he says he is going to have some surgery during this week at [facility] for possibly a colon resection. 12/31/2016 -- the patient has been noncompliant with his smoking and I'm not sure whether he is also started drinking again. He continues to be very nonchalant about his care 01/14/2017 -- the patient's HandP has been reviewed well and I understand he is being compliant with trying to give up smoking and his local dressing changes. He does not have any surgical options of 4 to him by his vascular surgeons.he was last seen in early  August by Dr. Hortencia Pilar who recommended compression stockings,and possibly lymph pumps in 2-3 months after doing a review ultrasound. 01/28/2017 - the patient did not have any fresh complaints but on examination I noted a large lacerated wound on the plantar aspect of his right fourth toe which had a lot of necrotic debris and it probes down to bone. 02/07/2017 -- x-ray of the right foot -- IMPRESSION: Soft tissue swelling about the first through fourth toes consistent with cellulitis. New destructive change in the tuft of the distal phalanx of the great toe is consistent with osteomyelitis. 02/14/2017 -- the patient's MRI is pending this coming Monday and he still continues to smoke. We have again gone over off loading of his wounds in  great detail and he says he's been compliant. 02/21/2017 -- MR of the right foot -- IMPRESSION: 1. Soft tissue ulcer at the tip of the first, second and third toe knows. Cortical irregularity and bone marrow edema in the first distal phalanx most concerning for osteomyelitis. Mild marrow edema in the second and third distal phalanx without definite cortical destruction which may reflect early osteomyelitis versus reactive marrow edema. 2. Soft tissue edema surrounding the first phalanx most consistent with cellulitis. the patient was also recently evaluated by his medical oncologist Dr. 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 She is treating him with weekly Procrit. She is also keeping intra-abdominal lymphadenopathy and right lower lobe lung nodule under observation. 04/04/2017 -- he was seen by Dr. Adrian Prows on 03/25/2017 -- after review he empirically put him on ciprofloxacin and doxycycline as they have good bone penetration and good bioavailability and it will cover the usual pathogens and diabetic foot osteomyelitis. He will check inflammatory markers and plan a 65-8 week old  records. C-reactive protein was 0.3 and the ESR was 72 04/18/17 on evaluation today patient appears to be doing about the same in regard to his lower extremity wounds bilaterally. He has continued to use the antifungal cream which does seem to be beneficial. Nonetheless the ulcers do seem to in some areas be epithelial eyes over and in other areas are still open. He is having no significant discomfort. 04/25/17-he is here in follow-up evaluation for multiple ulcerations to multiple toes bilaterally. He states he did see Dr. Ola Spurr again last week and continues antibiotic therapy. He is voicing no complaints or concerns, will continue with current treatment plan will possibility of adding compression therapy next week after an additional week of treatment/lotions to BLE prescribed by Dr Ola Spurr 05/02/17 he is here in follow up for for multiple ulcers to multiple toes bilaterally. we will stop using antifungal cream and will continue with silvercel and follow up next week 05/09/17-he is here in follow-up for multiple ulcerations to multiple toes bilaterally. There is improvement in appearance. He has not completely stopped using antifungal cream, but admits he has not using it between the toes. He has an appointment with Dr. Ola Spurr on 2/11, continues on doxycycline and Cipro. It has been 5 weeks of antibiotic therapy, we will order plain film xray to evaluate for osteomyelitis next week, prior to follow up with ID. Will continue with silvercel and follow up next week 05/16/17-he is here in pulmonary evaluation for multiple ulcerations to multiple toes bilaterally and new wound to the right posterior heel. There is essentially no change in appearance, deteriorating measurements; he has a history of waxing and waning measurements. He admits that he continues to apply moisturizer/cream/ointment to his toes despite weekly reminders to only apply silvercel to his toes. He states that he thinks the  surgical shoe contributed to the superficial ulcer to his posterior Botting, Tarek [name]. ([id number]) heel, he is unable to articulate if this was an area of dry cracked skin as he has a similar area to the left heel. He now is wearing open toed slippers. He has an appointment with Dr. Ola Spurr on 2/11. We have ordered x-rays for her bilateral feet; he was advised to obtain the x-rays today or tomorrow. He will follow-up next week Electronic Signature(s) Signed: 05/16/2017 10:09:15 AM By: Lawanda Cousins Entered By: Lawanda Cousins on 05/16/2017 10:09:15 Grattan, [name] ([id number]) -------------------------------------------------------------------------------- Physician Orders  Details Patient Name: [name], [name] BARTOLOMEI. Date of Service: 05/16/2017 8:45 AM Medical Record Number: [id number] Patient Account Number: [id number] Date of Birth/Sex: Aug 25, 1950 (67 y.o. Male) Treating RN: Ahmed Prima Primary Care Provider: Myrtie Hawk Other Clinician: Referring Provider: Myrtie Hawk Treating Provider/Extender: Cathie Olden in Treatment: 82 Verbal / Phone Orders: Yes Clinician: Carolyne Fiscal, Debi Read Back and Verified: Yes Diagnosis Coding Wound Cleansing Wound #1 Right Toe Great o Clean wound with Normal Saline. o May Shower, gently pat wound dry prior to applying new dressing. Wound #11 Right Calcaneus o Clean wound with Normal Saline. o May Shower, gently pat wound dry prior to applying new dressing. Wound #2 Right Toe Second o Clean wound with Normal Saline. o May Shower, gently pat wound dry prior to applying new dressing. Wound #5 Left Toe Great o Clean wound with Normal Saline. o May Shower, gently pat wound dry prior to applying new dressing. Wound #6 Left Toe Second o Clean wound with Normal Saline. o May Shower, gently pat wound dry prior to applying new dressing. Wound #9 Right Toe Third o Clean wound with Normal Saline. o May Shower,  gently pat wound dry prior to applying new dressing. Anesthetic (add to Medication List) Wound #1 Right Toe Great o Topical Lidocaine 4% cream applied to wound bed prior to debridement (In Clinic Only). Wound #11 Right Calcaneus o Topical Lidocaine 4% cream applied to wound bed prior to debridement (In Clinic Only). Wound #2 Right Toe Second o Topical Lidocaine 4% cream applied to wound bed prior to debridement (In Clinic Only). Wound #5 Left Toe Great o Topical Lidocaine 4% cream applied to wound bed prior to debridement (In Clinic Only). Wound #6 Left Toe Second o Topical Lidocaine 4% cream applied to wound bed prior to debridement (In Clinic Only). Wound #9 Right Toe Third o Topical Lidocaine 4% cream applied to wound bed prior to debridement (In Clinic Only). Primary Wound Dressing [place]osa, Dacian [name]. ([id number]) Wound #1 Right Toe Great o Silvercel Non-Adherent Wound #11 Right Calcaneus o Silvercel Non-Adherent Wound #2 Right Toe Second o Silvercel Non-Adherent Wound #5 Left Toe Great o Silvercel Non-Adherent Wound #6 Left Toe Second o Silvercel Non-Adherent Wound #9 Right Toe Third o Silvercel Non-Adherent Secondary Dressing Wound #1 Right Toe Great o ABD and Kerlix/Conform Wound #11 Right Calcaneus o Conform/Kerlix o Other - allevyn heel cup Wound #2 Right Toe Second o ABD and Kerlix/Conform Wound #5 Left Toe Great o ABD and Kerlix/Conform Wound #6 Left Toe Second o ABD and Kerlix/Conform Wound #9 Right Toe Third o ABD and Kerlix/Conform Dressing Change Frequency Wound #1 Right Toe Great o Change dressing every day. Wound #11 Right Calcaneus o Change dressing every day. Wound #2 Right Toe Second o Change dressing every day. Wound #5 Left Toe Great o Change dressing every day. Wound #6 Left Toe Second o Change dressing every day. Wound #9 Right Toe Third o Change dressing every day. KAIDAN, HARPSTER  ([id number]) Follow-up Appointments Wound #1 Right Toe Great o Return Appointment in 1 week. Wound #11 Right Calcaneus o Return Appointment in 1 week. Wound #2 Right Toe Second o Return Appointment in 1 week. Wound #5 Left Toe Great o Return Appointment in 1 week. Wound #6 Left Toe Second o Return Appointment in 1 week. Wound #9 Right Toe Third o Return Appointment in 1 week. Edema Control Wound #1 Right Toe Great o Patient to wear own Juxtalite/Juzo compression garment. Wound #11 Right Calcaneus o Patient to wear own  Juxtalite/Juzo compression garment. Wound #2 Right Toe Second o Patient to wear own Juxtalite/Juzo compression garment. Wound #5 Left Toe Great o Patient to wear own Juxtalite/Juzo compression garment. Wound #6 Left Toe Second o Patient to wear own Juxtalite/Juzo compression garment. Wound #9 Right Toe Third o Patient to wear own Juxtalite/Juzo compression garment. Additional Orders / Instructions Wound #1 Right Toe Great o Stop Smoking o Increase protein intake. o Other: - Please add vitamin A, vitamin C and zinc supplements to your diet Wound #11 Right Calcaneus o Stop Smoking o Increase protein intake. o Other: - Please add vitamin A, vitamin C and zinc supplements to your diet Wound #2 Right Toe Second o Stop Smoking o Increase protein intake. o Other: - Please add vitamin A, vitamin C and zinc supplements to your diet Wound #5 Left Toe Great o Stop Smoking Mowrey, Takuma [name]. ([id number]) o Increase protein intake. o Other: - Please add vitamin A, vitamin C and zinc supplements to your diet Wound #6 Left Toe Second o Stop Smoking o Increase protein intake. o Other: - Please add vitamin A, vitamin C and zinc supplements to your diet Wound #9 Right Toe Third o Stop Smoking o Increase protein intake. o Other: - Please add vitamin A, vitamin C and zinc supplements to your diet Radiology o  X-ray, foot - L foot, R Foot Patient Medications Allergies: No Known Drug Allergies Notifications Medication Indication Start End lidocaine DOSE 1 - topical 4 % cream - 1 cream topical ammonium lactate 05/16/2017 DOSE topical 12 % lotion - lotion topical to BLE Electronic Signature(s) Signed: 05/16/2017 3:49:02 PM By: Lawanda Cousins Previous Signature: 05/16/2017 9:33:43 AM Version By: Lawanda Cousins Entered By: Lawanda Cousins on 05/16/2017 10:10:09 Mottram, [name] ([id number]) -------------------------------------------------------------------------------- Prescription 05/16/2017 Patient Name: Merrily Brittle [name]. Provider: Lawanda Cousins NP Date of Birth: 1950-11-05 NPI#: [id number] Sex: Jerilynn Mages DEA#: [id number] Phone #: [phone] License #: Patient Address: [facility] [place], [place] [address] [address] [place], [place] [address] [phone] Allergies No Known Drug Allergies Medication Medication: Route: Strength: Form: lidocaine 4 % topical cream topical 4% cream Class: TOPICAL LOCAL ANESTHETICS Dose: Frequency / Time: Indication: 1 1 cream topical Number of Refills: Number of Units: 0 Generic Substitution: Start Date: End Date: One Time Use: Substitution Permitted No Note to Pharmacy: Signature(s): Date(s): Electronic Signature(s) Signed: 05/16/2017 3:49:02 PM By: Lawanda Cousins Entered By: Lawanda Cousins on 05/16/2017 10:10:12 [place]halter, [name] ([id number]) --------------------------------------------------------------------------------  Problem List Details Patient Name: Blizzard, Daishon [name]. Date of Service: 05/16/2017 8:45 AM Medical Record Number: [id number] Patient Account Number: [id number] Date of Birth/Sex: July 20, 1950 (67 y.o. Male) Treating RN: Ahmed Prima Primary Care Provider: Myrtie Hawk Other Clinician: Referring Provider: Myrtie Hawk Treating  Provider/Extender: Cathie Olden in Treatment: 71 Active Problems ICD-10 Encounter Code Description Active Date Diagnosis E11.621 Type 2 diabetes mellitus with foot ulcer 09/21/2016 Yes I87.313 Chronic venous hypertension (idiopathic) with ulcer of bilateral 09/21/2016 Yes lower extremity I89.0 Lymphedema, not elsewhere classified 09/21/2016 Yes L97.522 Non-pressure chronic ulcer of other part of left foot with fat layer 09/21/2016 Yes exposed L97.512 Non-pressure chronic ulcer of other part of right foot with fat layer 09/21/2016 Yes exposed F17.218 Nicotine dependence, cigarettes, with other nicotine-induced 09/21/2016 Yes disorders F10.19 Alcohol abuse with unspecified alcohol-induced disorder 09/21/2016 Yes M86.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, right ankle and foot 02/21/2017 Yes Inactive Problems Resolved Problems Electronic Signature(s) Signed: 05/16/2017 9:51:17 AM By: Lawanda Cousins Entered By:  Merina Behrendt on 05/16/2017 09:51:16 [name], [name] ([id number]) -------------------------------------------------------------------------------- Progress Note Details Patient Name: [name], [name] GRONAU. Date of Service: 05/16/2017 8:45 AM Medical Record Number: [id number] Patient Account Number: [id number] Date of Birth/Sex: 04-25-1950 (67 y.o. Male) Treating RN: Ahmed Prima Primary Care Provider: Myrtie Hawk Other Clinician: Referring Provider: Myrtie Hawk Treating Provider/Extender: Cathie Olden in Treatment: 12 Subjective Chief Complaint Information obtained from Patient Patient presents for treatment of an open diabetic ulcer to both feet History of Present Illness (HPI) The following HPI elements were documented for the patient's wound: Location: bilateral feet ulceration on the toes Quality: Patient reports experiencing a dull pain to affected area(s). Severity: Patient states wound are getting better Duration: Patient has had the wound for > 3  months prior to seeking treatment at the wound center Timing: Pain in wound is constant (hurts all the time) Context: The wound would happen gradually Modifying Factors: Other treatment(s) tried include:treatment for lymphedema and is seen by the podiatrist Dr. Caryl Comes Associated Signs and Symptoms: Patient reports having increase swelling. 67 year old patient here to see as for bilateral feet ulceration to on his left first and second toe and 2 on his right first and second toe, which she's had for about 4 months. 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 Donella Stade. The patient is also undergoing workup by medical oncology for a intra-abdominal lymphadenopathy. 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 He is also status post appendectomy, inguinal lymph node biopsy, rectal biopsy and rectal examination under anesthesia. he currently smokes cigarettes about half packet a day. In March of this year he was seen by Dr. Hortencia Pilar, for evaluation of bilateral varicose veins and besides wearing compression stockings he had recommended laser ablation of the right and left great saphenous veins to eleviate the symptoms and complications of severe superficial venous reflux disease. He also recommended lymphedema pumps for better control of his lymphedema. The patient recently has had on 08/23/2016, right greater saphenous vein ablation with the laser energy Earlier lower extremity venous reflux examination done on 05/08/2016 showed no DVT or SVT both lower legs but incompetence of bilateral great saphenous veins was present. A lower arterial study was also done and there was no significant right lower and left lower extremity problems based on a normal  toe brachial index bilaterally and the ABI was 1.21 the left and 1.23 on the right. His post ablation venous duplex examination showed successful ablation of the right GS vein with thrombus formation 2 below the right saphenofemoral junction. The deep system was patent without evidence of thrombosis and this was done on 08/30/2016. 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 Addendum: regarding his x-rays done today and x-ray of the left foot -- IMPRESSION: No objective evidence of osteomyelitis. There are soft tissue changes which may reflect cellulitis. X-ray of the right foot -- IMPRESSION:Findings compatible with cellulitis of the toes. No objective evidence of osteomyelitis is observed. 10/01/16 on evaluation today patient's wounds appeared to be doing some better. I did review the x-rays as well which showed no evidence of osteomyelitis although there was evidence on x-ray of cellulitis. He fortunately is not having  any discomfort [name], [name] ([id number]) although he continues to have some swelling. He does not remember being on any antibiotics recently. 10/15/16 on evaluation today patient's wounds overall appear to be doing better although he does have a new location noted on the left foot. Fortunately he is not having significant pain. It almost has the appearance that something is rubbing on the end of his toes but he wears the open toe shoes and according to what he is telling me never wears anything that would rub on his foot. There is no evidence of infection and specifically no evidence of a fungal infection 10/22/16 On evaluation today patient's wounds appeared to be doing better compared to last week in regard to his bilateral lower extremities. Fortunately I happy with how things are progressing although he still has ulcers I feel like that he is improving and appropriate  manner. 11/12/16 on evaluation today patient appears to be doing well in regard to his bilateral feet and the respective wounds. We have been using surrounding her dressings along with an antifungal cream which seems to be doing very well. He has no bilateral dysfunction noticed that the rituals are weight loss at this point. He also has no nausea or vomiting a note purulent discharge. He did see Vein and vascular today and he tells me that they told him he could have surgery for his venous stasis but they did not feel like it was worth it in his words. Fortunately patient's wounds do appear to be getting sneakily better. 11/26/2016 -- he says he is going to have some surgery during this week at [facility] for possibly a colon resection. 12/31/2016 -- the patient has been noncompliant with his smoking and I'm not sure whether he is also started drinking again. He continues to be very nonchalant about his care 01/14/2017 -- the patient's HandP has been reviewed well and I understand he is being compliant with trying to give up smoking and his local dressing changes. He does not have any surgical options of 4 to him by his vascular surgeons.he was last seen in early August by Dr. Hortencia Pilar who recommended compression stockings,and possibly lymph pumps in 2-3 months after doing a review ultrasound. 01/28/2017 - the patient did not have any fresh complaints but on examination I noted a large lacerated wound on the plantar aspect of his right fourth toe which had a lot of necrotic debris and it probes down to bone. 02/07/2017 -- x-ray of the right foot -- IMPRESSION: Soft tissue swelling about the first through fourth toes consistent with cellulitis. New destructive change in the tuft of the distal phalanx of the great toe is consistent with osteomyelitis. 02/14/2017 -- the patient's MRI is pending this coming Monday and he still continues to smoke. We have again gone over off loading of his  wounds in great detail and he says he's been compliant. 02/21/2017 -- MR of the right foot -- IMPRESSION: 1. Soft tissue ulcer at the tip of the first, second and third toe knows. Cortical irregularity and bone marrow edema in the first distal phalanx most concerning for osteomyelitis. Mild marrow edema in the second and third distal phalanx without definite cortical destruction which may reflect early osteomyelitis versus reactive marrow edema. 2. Soft tissue edema surrounding the first phalanx most consistent with cellulitis. the patient was also recently evaluated by his medical oncologist Dr. 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 She is treating him with weekly Procrit. She is also keeping intra-abdominal lymphadenopathy and right lower lobe lung nodule under observation. 04/04/2017 -- he was seen by Dr. Adrian Prows on 03/25/2017 -- after review he empirically put him on ciprofloxacin and doxycycline as they have good bone penetration and good bioavailability and it will cover the usual pathogens and diabetic foot osteomyelitis. He will check inflammatory markers and plan a 60-68 week old records. C-reactive protein was 0.3 and the ESR was 72 04/18/17 on evaluation today patient appears to be doing about the same in regard to his lower extremity wounds bilaterally. He has continued to use the antifungal cream which does seem to be beneficial. Nonetheless the ulcers do seem to in some areas be epithelial eyes over and in other areas are still open. He is having no significant discomfort. 04/25/17-he is here in follow-up evaluation for multiple ulcerations to multiple toes bilaterally. He states he did see Dr. Ola Spurr again last week and continues antibiotic therapy. He is voicing no complaints or concerns, will continue with current treatment plan will possibility of adding compression therapy next week after an  additional week of treatment/lotions to BLE prescribed by Dr Ola Spurr 05/02/17 he is here in follow up for for multiple ulcers to multiple toes bilaterally. we will stop using antifungal cream and will continue with silvercel and follow up next week [name], [name] ([id number]) 05/09/17-he is here in follow-up for multiple ulcerations to multiple toes bilaterally. There is improvement in appearance. He has not completely stopped using antifungal cream, but admits he has not using it between the toes. He has an appointment with Dr. Ola Spurr on 2/11, continues on doxycycline and Cipro. It has been 5 weeks of antibiotic therapy, we will order plain film xray to evaluate for osteomyelitis next week, prior to follow up with ID. Will continue with silvercel and follow up next week 05/16/17-he is here in pulmonary evaluation for multiple ulcerations to multiple toes bilaterally and new wound to the right posterior heel. There is essentially no change in appearance, deteriorating measurements; he has a history of waxing and waning measurements. He admits that he continues to apply moisturizer/cream/ointment to his toes despite weekly reminders to only apply silvercel to his toes. He states that he thinks the surgical shoe contributed to the superficial ulcer to his posterior heel, he is unable to articulate if this was an area of dry cracked skin as he has a similar area to the left heel. He now is wearing open toed slippers. He has an appointment with Dr. Ola Spurr on 2/11. We have ordered x-rays for her bilateral feet; he was advised to obtain the x-rays today or tomorrow. He will follow-up next week Patient History Information obtained from Patient. Social History Current every day smoker, Marital Status - Widowed, Alcohol Use - Daily - quit drinking about a week ago, Drug Use - No History, Caffeine Use - Moderate. Medical And Surgical History Notes Oncologic 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  with unknown treatment Objective Constitutional Vitals Time Taken: 8:41 AM, Height: 69 in, Weight: 168 lbs, BMI: 24.8, Temperature: 98.1 F, Pulse: 62 bpm, Respiratory Rate: 18 breaths/min, Blood Pressure: 162/88 mmHg. Integumentary (Hair, Skin) Wound #1 status is Open. Original cause of wound was Gradually Appeared. The wound is located on the Right Toe Great. The wound measures 1cm length x 6.5cm width x 0.1cm depth; 5.105cm^2 area and 0.511cm^3 volume. There is Fat Layer (Subcutaneous Tissue) Exposed exposed. There is no tunneling  or undermining noted. There is a large amount of serous drainage noted. Foul odor after cleansing was noted. The wound margin is flat and intact. There is medium (34-66%) red granulation within the wound bed. There is a small (1-33%) amount of necrotic tissue within the wound bed including Adherent Slough. The periwound skin appearance exhibited: Excoriation, Maceration. The periwound skin appearance did not exhibit: Callus, Crepitus, Induration, Rash, Scarring, Dry/Scaly, Atrophie Blanche, Cyanosis, Ecchymosis, Hemosiderin Staining, Mottled, Pallor, Rubor, Erythema. Periwound temperature was noted as No Abnormality. Wound #11 status is Open. Original cause of wound was Gradually Appeared. The wound is located on the Right Calcaneus. The wound measures 1cm length x 0.5cm width x 0.1cm depth; 0.393cm^2 area and 0.039cm^3 volume. There is no undermining noted. There is a large amount of serosanguineous drainage noted. The wound margin is distinct with the outline attached to the wound base. There is large (67-100%) red granulation within the wound bed. There is a small (1-33%) amount of necrotic tissue within the wound bed including Adherent Slough. Periwound temperature was noted as No Abnormality. The periwound has tenderness on palpation. Wound #2 status is Open. Original cause of wound was Gradually Appeared. The wound is located on the Right Toe Second. Prosser,  Laderius [name]. ([id number]) The wound measures 1.5cm length x 2cm width x 0.1cm depth; 2.356cm^2 area and 0.236cm^3 volume. There is Fat Layer (Subcutaneous Tissue) Exposed exposed. There is no tunneling or undermining noted. There is a large amount of serosanguineous drainage noted. Foul odor after cleansing was noted. The wound margin is flat and intact. There is medium (34-66%) red granulation within the wound bed. There is a medium (34-66%) amount of necrotic tissue within the wound bed including Adherent Slough. The periwound skin appearance exhibited: Maceration. The periwound skin appearance did not exhibit: Callus, Crepitus, Excoriation, Induration, Rash, Scarring, Dry/Scaly, Atrophie Blanche, Cyanosis, Ecchymosis, Hemosiderin Staining, Mottled, Pallor, Rubor, Erythema. Periwound temperature was noted as No Abnormality. Wound #5 status is Open. Original cause of wound was Gradually Appeared. The wound is located on the Left Toe Great. The wound measures 0.6cm length x 2.7cm width x 0.1cm depth; 1.272cm^2 area and 0.127cm^3 volume. There is Fat Layer (Subcutaneous Tissue) Exposed exposed. There is no tunneling or undermining noted. There is a large amount of serosanguineous drainage noted. Foul odor after cleansing was noted. The wound margin is flat and intact. There is medium (34-66%) red granulation within the wound bed. There is a small (1-33%) amount of necrotic tissue within the wound bed including Adherent Slough. The periwound skin appearance exhibited: Maceration. The periwound skin appearance did not exhibit: Callus, Crepitus, Excoriation, Induration, Rash, Scarring, Dry/Scaly, Atrophie Blanche, Cyanosis, Ecchymosis, Hemosiderin Staining, Mottled, Pallor, Rubor, Erythema. Periwound temperature was noted as No Abnormality. Wound #6 status is Open. Original cause of wound was Gradually Appeared. The wound is located on the Left Toe Second. The wound measures 1.3cm length x 1.2cm width x  0.2cm depth; 1.225cm^2 area and 0.245cm^3 volume. There is Fat Layer (Subcutaneous Tissue) Exposed exposed. There is no tunneling or undermining noted. There is a large amount of serous drainage noted. Foul odor after cleansing was noted. The wound margin is flat and intact. There is large (67-100%) red granulation within the wound bed. There is a small (1-33%) amount of necrotic tissue within the wound bed including Adherent Slough. The periwound skin appearance exhibited: Maceration. The periwound skin appearance did not exhibit: Callus, Crepitus, Excoriation, Induration, Rash, Scarring, Dry/Scaly, Atrophie Blanche, Cyanosis, Ecchymosis, Hemosiderin Staining, Mottled, Pallor, Rubor, Erythema.  Periwound temperature was noted as No Abnormality. Wound #9 status is Open. Original cause of wound was Gradually Appeared. The wound is located on the Right Toe Third. The wound measures 1.2cm length x 1.2cm width x 0.1cm depth; 1.131cm^2 area and 0.113cm^3 volume. There is Fat Layer (Subcutaneous Tissue) Exposed exposed. There is a large amount of purulent drainage noted. Foul odor after cleansing was noted. The wound margin is flat and intact. There is medium (34-66%) red granulation within the wound bed. There is a medium (34-66%) amount of necrotic tissue within the wound bed including Adherent Slough. The periwound skin appearance exhibited: Maceration. The periwound skin appearance did not exhibit: Callus, Crepitus, Excoriation, Induration, Rash, Scarring, Dry/Scaly, Atrophie Blanche, Cyanosis, Ecchymosis, Hemosiderin Staining, Mottled, Pallor, Rubor, Erythema. Periwound temperature was noted as No Abnormality. Assessment Active Problems ICD-10 E11.621 - Type 2 diabetes mellitus with foot ulcer I87.313 - Chronic venous hypertension (idiopathic) with ulcer of bilateral lower extremity I89.0 - Lymphedema, not elsewhere classified L97.522 - Non-pressure chronic ulcer of other part of left foot with  fat layer exposed L97.512 - Non-pressure chronic ulcer of other part of right foot with fat layer exposed F17.218 - Nicotine dependence, cigarettes, with other nicotine-induced disorders F10.19 - Alcohol abuse 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 - Chronic multifocal osteomyelitis, right ankle and foot Procedures Boudreau, Treylen [name]. ([id number]) Wound #1 Pre-procedure diagnosis of Wound #1 is a Diabetic Wound/Ulcer of the Lower Extremity located on the Right Toe Great .Severity of Tissue Pre Debridement is: Fat layer exposed. There was a Skin/Subcutaneous Tissue Debridement ([id number]) debridement with total area of 1 sq cm performed by STONE III, HOYT [name]., PA-C. with the following instrument (s): Curette to remove Viable and Non-Viable tissue/material including Exudate, Fibrin/Slough, and Subcutaneous after achieving pain control using Lidocaine 4% Topical Solution. A time out was conducted at 09:06, prior to the start of the procedure. A Minimum amount of bleeding was controlled with Pressure. The procedure was tolerated well with a pain level of 0 throughout and a pain level of 0 following the procedure. Post Debridement Measurements: 1cm length x 6.5cm width x 0.2cm depth; 1.021cm^3 volume. Character of Wound/Ulcer Post Debridement requires further debridement. Severity of Tissue Post Debridement is: Fat layer exposed. Post procedure Diagnosis Wound #1: Same as Pre-Procedure Wound #11 Pre-procedure diagnosis of Wound #11 is a Diabetic Wound/Ulcer of the Lower Extremity located on the Right Calcaneus .Severity of Tissue Pre Debridement is: Fat layer exposed. There was a Skin/Subcutaneous Tissue Debridement ([id number]) debridement with total area of 0.5 sq cm performed by STONE III, HOYT [name]., PA-C. with the following instrument(s): Curette to remove Viable and Non-Viable tissue/material including Exudate, Fibrin/Slough, and Subcutaneous after achieving pain control using Lidocaine  4% Topical Solution. A time out was conducted at 09:06, prior to the start of the procedure. A Minimum amount of bleeding was controlled with Pressure. The procedure was tolerated well with a pain level of 0 throughout and a pain level of 0 following the procedure. Post Debridement Measurements: 1cm length x 0.5cm width x 0.2cm depth; 0.079cm^3 volume. Character of Wound/Ulcer Post Debridement requires further debridement. Severity of Tissue Post Debridement is: Fat layer exposed. Post procedure Diagnosis Wound #11: Same as Pre-Procedure Wound #6 Pre-procedure diagnosis of Wound #6 is a Diabetic Wound/Ulcer of the Lower Extremity located on the Left Toe Second .Severity of Tissue Pre Debridement is: Fat layer exposed. There was a Skin/Subcutaneous Tissue Debridement ([id number]) debridement with total area of 1.56 sq cm performed by STONE III, HOYT [name].,  PA-C. with the following instrument(s): Curette to remove Viable and Non-Viable tissue/material including Exudate, Fibrin/Slough, and Subcutaneous after achieving pain control using Lidocaine 4% Topical Solution. A time out was conducted at 09:06, prior to the start of the procedure. A Minimum amount of bleeding was controlled with Pressure. The procedure was tolerated well with a pain level of 0 throughout and a pain level of 0 following the procedure. Post Debridement Measurements: 1.3cm length x 1.2cm width x 0.2cm depth; 0.245cm^3 volume. Character of Wound/Ulcer Post Debridement requires further debridement. Severity of Tissue Post Debridement is: Fat layer exposed. Post procedure Diagnosis Wound #6: Same as Pre-Procedure Plan Wound Cleansing: Wound #1 Right Toe Great: Clean wound with Normal Saline. May Shower, gently pat wound dry prior to applying new dressing. Wound #11 Right Calcaneus: Clean wound with Normal Saline. May Shower, gently pat wound dry prior to applying new dressing. Wound #2 Right Toe Second: Clean wound with  Normal Saline. May Shower, gently pat wound dry prior to applying new dressing. Difranco, Jaret [name]. ([id number]) Wound #5 Left Toe Great: Clean wound with Normal Saline. May Shower, gently pat wound dry prior to applying new dressing. Wound #6 Left Toe Second: Clean wound with Normal Saline. May Shower, gently pat wound dry prior to applying new dressing. Wound #9 Right Toe Third: Clean wound with Normal Saline. May Shower, gently pat wound dry prior to applying new dressing. Anesthetic (add to Medication List): Wound #1 Right Toe Great: Topical Lidocaine 4% cream applied to wound bed prior to debridement (In Clinic Only). Wound #11 Right Calcaneus: Topical Lidocaine 4% cream applied to wound bed prior to debridement (In Clinic Only). Wound #2 Right Toe Second: Topical Lidocaine 4% cream applied to wound bed prior to debridement (In Clinic Only). Wound #5 Left Toe Great: Topical Lidocaine 4% cream applied to wound bed prior to debridement (In Clinic Only). Wound #6 Left Toe Second: Topical Lidocaine 4% cream applied to wound bed prior to debridement (In Clinic Only). Wound #9 Right Toe Third: Topical Lidocaine 4% cream applied to wound bed prior to debridement (In Clinic Only). Primary Wound Dressing: Wound #1 Right Toe Great: Silvercel Non-Adherent Wound #11 Right Calcaneus: Silvercel Non-Adherent Wound #2 Right Toe Second: Silvercel Non-Adherent Wound #5 Left Toe Great: Silvercel Non-Adherent Wound #6 Left Toe Second: Silvercel Non-Adherent Wound #9 Right Toe Third: Silvercel Non-Adherent Secondary Dressing: Wound #1 Right Toe Great: ABD and Kerlix/Conform Wound #11 Right Calcaneus: Conform/Kerlix Other - allevyn heel cup Wound #2 Right Toe Second: ABD and Kerlix/Conform Wound #5 Left Toe Great: ABD and Kerlix/Conform Wound #6 Left Toe Second: ABD and Kerlix/Conform Wound #9 Right Toe Third: ABD and Kerlix/Conform Dressing Change Frequency: Wound #1 Right Toe  Great: Change dressing every day. Wound #11 Right Calcaneus: Change dressing every day. Wound #2 Right Toe Second: Change dressing every day. Wound #5 Left Toe Great: Change dressing every day. Wound #6 Left Toe Second: Change dressing every day. BRYCIN, KILLE ([id number]) Wound #9 Right Toe Third: Change dressing every day. Follow-up Appointments: Wound #1 Right Toe Great: Return Appointment in 1 week. Wound #11 Right Calcaneus: Return Appointment in 1 week. Wound #2 Right Toe Second: Return Appointment in 1 week. Wound #5 Left Toe Great: Return Appointment in 1 week. Wound #6 Left Toe Second: Return Appointment in 1 week. Wound #9 Right Toe Third: Return Appointment in 1 week. Edema Control: Wound #1 Right Toe Great: Patient to wear own Juxtalite/Juzo compression garment. Wound #11 Right Calcaneus: Patient to wear own Juxtalite/Juzo  compression garment. Wound #2 Right Toe Second: Patient to wear own Juxtalite/Juzo compression garment. Wound #5 Left Toe Great: Patient to wear own Juxtalite/Juzo compression garment. Wound #6 Left Toe Second: Patient to wear own Juxtalite/Juzo compression garment. Wound #9 Right Toe Third: Patient to wear own Juxtalite/Juzo compression garment. Additional Orders / Instructions: Wound #1 Right Toe Great: Stop Smoking Increase protein intake. Other: - Please add vitamin A, vitamin C and zinc supplements to your diet Wound #11 Right Calcaneus: Stop Smoking Increase protein intake. Other: - Please add vitamin A, vitamin C and zinc supplements to your diet Wound #2 Right Toe Second: Stop Smoking Increase protein intake. Other: - Please add vitamin A, vitamin C and zinc supplements to your diet Wound #5 Left Toe Great: Stop Smoking Increase protein intake. Other: - Please add vitamin A, vitamin C and zinc supplements to your diet Wound #6 Left Toe Second: Stop Smoking Increase protein intake. Other: - Please add vitamin A, vitamin  C and zinc supplements to your diet Wound #9 Right Toe Third: Stop Smoking Increase protein intake. Other: - Please add vitamin A, vitamin C and zinc supplements to your diet Radiology ordered were: X-ray, foot - L foot, R Foot The following medication(s) was prescribed: lidocaine topical 4 % cream 1 1 cream topical was prescribed at facility ammonium lactate topical 12 % lotion lotion topical to BLE starting 05/16/2017 Swier, Vern [name]. ([id number]) 1. X-rays bilateral feet 2. Maintain appointment with Dr. Ola Spurr on 2/11 3. Silvercel all wounds 4. Lac-Hydrin to bilateral lower extremities, excluding the toes 5. Follow-up next week Electronic Signature(s) Signed: 05/16/2017 10:11:03 AM By: Lawanda Cousins Entered By: Lawanda Cousins on 05/16/2017 10:11:03 Wessels, [name] ([id number]) -------------------------------------------------------------------------------- ROS/PFSH Details Patient Name: Massing, Jawad [name]. Date of Service: 05/16/2017 8:45 AM Medical Record Number: [id number] Patient Account Number: [id number] Date of Birth/Sex: April 21, 1950 (67 y.o. Male) Treating RN: Ahmed Prima Primary Care Provider: Myrtie Hawk Other Clinician: Referring Provider: Myrtie Hawk Treating Provider/Extender: Cathie Olden in Treatment: 48 Information Obtained From Patient Wound History Do you currently have one or more open woundso Yes How many open wounds do you currently haveo 6 Approximately how long have you had your woundso 3 months How have you been treating your wound(s) until nowo ointment and bandage Has your wound(s) ever healed and then re-openedo No Have you had any lab work done in the past montho No Have you tested positive for an antibiotic resistant organism (MRSA, VRE)o No Have you tested positive for osteomyelitis (bone infection)o No Have you had any tests for circulation on your legso Yes Who ordered the testo PCP Where was the test doneo  [facility] Medical History: Negative for: Cataracts; Glaucoma; Optic Neuritis Ear/Nose/Mouth/Throat Medical History: Negative for: Chronic sinus problems/congestion; Middle ear problems Hematologic/Lymphatic Medical History: Positive for: Anemia; Lymphedema Negative for: Hemophilia; Human Immunodeficiency Virus; Sickle Cell Disease Respiratory Medical History: Negative for: Aspiration; Asthma; Chronic Obstructive Pulmonary Disease (COPD); Pneumothorax; Sleep Apnea; Tuberculosis Cardiovascular Medical History: Positive for: Congestive Heart Failure; Hypertension; Peripheral Venous Disease Negative for: Angina; Arrhythmia; Coronary Artery Disease; Deep Vein Thrombosis; Hypotension; Myocardial Infarction; Peripheral Arterial Disease; Phlebitis; Vasculitis Gastrointestinal Medical History: Negative for: Cirrhosis ; Colitis; Crohnos; Hepatitis A; Hepatitis B; Hepatitis C Collar, Tryce [name]. ([id number]) Endocrine Medical History: Positive for: Type II Diabetes Treated with: Oral agents Blood sugar tested every day: Yes Tested : QD Genitourinary Medical History: Negative for: End Stage Renal Disease Immunological Medical History: Negative for: Lupus Erythematosus; Raynaudos; Scleroderma Integumentary (Skin) Medical History: Negative  for: History of Burn; History of pressure wounds Musculoskeletal Medical History: Negative for: Gout; Rheumatoid Arthritis; Osteoarthritis; Osteomyelitis Neurologic Medical History: Positive for: Neuropathy Negative for: Dementia; Quadriplegia; Paraplegia; Seizure Disorder Oncologic Medical History: Past Medical History Notes: 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 Electronic Signature(s) Signed: 05/16/2017 3:16:44 PM By: Alric Quan Signed: 05/16/2017 3:49:02 PM By: Stanton Kidney ([id number]) Entered By: Lawanda Cousins on 05/16/2017 10:09:30 Mapes, [name] ([id number]) -------------------------------------------------------------------------------- SuperBill Details Patient Name: Olesky, Coral [name]. Date of Service: 05/16/2017 Medical Record Number: [id number] Patient Account Number: [id number] Date of Birth/Sex: 1950-11-02 (67 y.o. 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.512 Non-pressure chronic ulcer of other part of right foot with fat layer exposed F17.218 Nicotine dependence, cigarettes, 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 abuse 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, right ankle and foot Facility Procedures CPT4 Code: [id number] Description: 69485 - DEB SUBQ TISSUE 20 SQ CM/< ICD-10 Diagnosis Description E11.621 Type 2 diabetes mellitus with foot ulcer L97.522 Non-pressure chronic ulcer of other part of left foot with fat L97.512 Non-pressure chronic ulcer of other part of right   foot with fat M86.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, right ankle and foot Modifier: layer exposed layer exposed Quantity: 1 Physician Procedures CPT4 Code: 4627035 Description: 00938 - WC PHYS SUBQ TISS 20 SQ CM ICD-10 Diagnosis Description E11.621 Type 2 diabetes mellitus with foot ulcer L97.522 Non-pressure chronic ulcer of other part of left foot with fat L97.512 Non-pressure chronic ulcer of other part of right  foot with fat M86.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, right ankle and foot Modifier: layer exposed layer exposed Quantity: 1 Electronic Signature(s) Signed: 05/16/2017 10:11:22 AM By: Lawanda Cousins Entered By: Lawanda Cousins on 05/16/2017 10:11:21

## 2017-05-23 ENCOUNTER — Encounter: Payer: Medicare HMO | Admitting: Nurse Practitioner

## 2017-05-23 ENCOUNTER — Inpatient Hospital Stay: Payer: Medicare HMO

## 2017-05-23 VITALS — BP 168/87 | HR 69

## 2017-05-23 DIAGNOSIS — D649 Anemia, unspecified: Secondary | ICD-10-CM

## 2017-05-23 DIAGNOSIS — D638 Anemia in other chronic diseases classified elsewhere: Secondary | ICD-10-CM

## 2017-05-23 DIAGNOSIS — D509 Iron deficiency anemia, unspecified: Secondary | ICD-10-CM

## 2017-05-23 DIAGNOSIS — Z862 Personal history of diseases of the blood and blood-forming organs and certain disorders involving the immune mechanism: Secondary | ICD-10-CM

## 2017-05-23 DIAGNOSIS — D472 Monoclonal gammopathy: Secondary | ICD-10-CM | POA: Diagnosis not present

## 2017-05-23 DIAGNOSIS — E11621 Type 2 diabetes mellitus with foot ulcer: Secondary | ICD-10-CM | POA: Diagnosis not present

## 2017-05-23 DIAGNOSIS — N189 Chronic kidney disease, unspecified: Secondary | ICD-10-CM

## 2017-05-23 LAB — CBC WITH DIFFERENTIAL/PLATELET
BASOS ABS: 0.1 10*3/uL (ref 0–0.1)
Basophils Relative: 1 %
Eosinophils Absolute: 0.5 10*3/uL (ref 0–0.7)
Eosinophils Relative: 8 %
HEMATOCRIT: 29.7 % — AB (ref 40.0–52.0)
Hemoglobin: 9.3 g/dL — ABNORMAL LOW (ref 13.0–18.0)
LYMPHS ABS: 1 10*3/uL (ref 1.0–3.6)
LYMPHS PCT: 16 %
MCH: 26.4 pg (ref 26.0–34.0)
MCHC: 31.4 g/dL — ABNORMAL LOW (ref 32.0–36.0)
MCV: 84.1 fL (ref 80.0–100.0)
MONOS PCT: 14 %
Monocytes Absolute: 0.8 10*3/uL (ref 0.2–1.0)
NEUTROS ABS: 3.6 10*3/uL (ref 1.4–6.5)
Neutrophils Relative %: 61 %
Platelets: 243 10*3/uL (ref 150–440)
RBC: 3.53 MIL/uL — ABNORMAL LOW (ref 4.40–5.90)
RDW: 14.6 % — AB (ref 11.5–14.5)
WBC: 6 10*3/uL (ref 3.8–10.6)

## 2017-05-23 LAB — IRON AND TIBC
IRON: 19 ug/dL — AB (ref 45–182)
Saturation Ratios: 6 % — ABNORMAL LOW (ref 17.9–39.5)
TIBC: 333 ug/dL (ref 250–450)
UIBC: 314 ug/dL

## 2017-05-23 LAB — VITAMIN B12: VITAMIN B 12: 309 pg/mL (ref 180–914)

## 2017-05-23 LAB — FERRITIN: FERRITIN: 48 ng/mL (ref 24–336)

## 2017-05-23 LAB — FOLATE: FOLATE: 12.8 ng/mL (ref >5.9)

## 2017-05-23 MED ORDER — EPOETIN ALFA 40000 UNIT/ML IJ SOLN
40000.0000 [IU] | INTRAMUSCULAR | Status: DC
  Administered 2017-05-23: 40000 [IU] via SUBCUTANEOUS

## 2017-05-23 NOTE — Progress Notes (Unsigned)
Confirmed with Dr. Janese Banks to proceed with Procrit injection with bp at 168/87 today.

## 2017-05-26 NOTE — Progress Notes (Signed)
[name] ([id number]) Visit Report for 05/23/2017 Chief Complaint Document Details Patient Name: [name], [name] Date of Service: 05/23/2017 8:45 AM Medical Record Number: [id number] Patient Account Number: [id number] Date of Birth/Sex: 01-12-1951 (67 y.o. Male) Treating RN: Ahmed Prima Primary Care Provider: Myrtie Hawk Other Clinician: Referring Provider: Myrtie Hawk Treating Provider/Extender: Cathie Olden in Treatment: 47 Information Obtained from: Patient Chief Complaint Patient presents for treatment of an open diabetic ulcer to both feet Electronic Signature(s) Signed: 05/23/2017 9:36:20 AM By: Lawanda Cousins Entered By: Lawanda Cousins on 05/23/2017 09:36:19 Sidman, [name] ([id number]) -------------------------------------------------------------------------------- Debridement Details Patient Name: [name], [name] Date of Service: 05/23/2017 8:45 AM Medical Record Number: [id number] Patient Account Number: [id number] Date of Birth/Sex: 1950-11-05 (68 y.o. Male) Treating RN: Ahmed Prima Primary Care Provider: Myrtie Hawk Other Clinician: Referring Provider: Myrtie Hawk Treating Provider/Extender: Cathie Olden in Treatment: 34 Debridement Performed for Wound #2 Right Toe Second Assessment: Performed By: Physician Lawanda Cousins, NP Debridement: Debridement Severity of Tissue Pre Fat layer exposed Debridement: Pre-procedure Verification/Time Yes - 09:24 Out Taken: Start Time: 09:26 Pain Control: Lidocaine 4% Topical Solution Level: Skin/Subcutaneous Tissue Total Area Debrided (L x W): 1.8 (cm) x 1.7 (cm) = 3.06 (cm) Tissue and other material Viable, Non-Viable, Exudate, Fibrin/Slough, Subcutaneous debrided: Instrument: Curette Bleeding: Minimum Hemostasis Achieved: Pressure End Time: 09:27 Procedural Pain: 0 Post Procedural Pain: 0 Response to Treatment: Procedure was tolerated well Post  Debridement Measurements of Total Wound Length: (cm) 1.8 Width: (cm) 1.7 Depth: (cm) 0.2 Volume: (cm) 0.481 Character of Wound/Ulcer Post Debridement: Requires Further Debridement Severity of Tissue Post Debridement: Fat layer exposed Post Procedure Diagnosis Same as Pre-procedure Electronic Signature(s) Signed: 05/23/2017 5:17:02 PM By: Lawanda Cousins Signed: 05/24/2017 4:55:35 PM By: Alric Quan Entered By: Alric Quan on 05/23/2017 09:28:08 Swinson, Council E. ([id number]) -------------------------------------------------------------------------------- Debridement Details Patient Name: [name], [name] Date of Service: 05/23/2017 8:45 AM Medical Record Number: [id number] Patient Account Number: [id number] Date of Birth/Sex: 05-05-1950 (67 y.o. Male) Treating RN: Ahmed Prima Primary Care Provider: Myrtie Hawk Other Clinician: Referring Provider: Myrtie Hawk Treating Provider/Extender: Cathie Olden in Treatment: 34 Debridement Performed for Wound #6 Left Toe Second Assessment: Performed By: Physician Lawanda Cousins, NP Debridement: Debridement Severity of Tissue Pre Fat layer exposed Debridement: Pre-procedure Verification/Time Yes - 09:24 Out Taken: Start Time: 09:27 Pain Control: Lidocaine 4% Topical Solution Level: Skin/Subcutaneous Tissue Total Area Debrided (L x W): 0.9 (cm) x 0.6 (cm) = 0.54 (cm) Tissue and other material Viable, Non-Viable, Exudate, Fibrin/Slough, Subcutaneous debrided: Instrument: Curette Bleeding: Minimum Hemostasis Achieved: Pressure End Time: 09:28 Procedural Pain: 0 Post Procedural Pain: 0 Response to Treatment: Procedure was tolerated well Post Debridement Measurements of Total Wound Length: (cm) 0.9 Width: (cm) 0.6 Depth: (cm) 0.2 Volume: (cm) 0.085 Character of Wound/Ulcer Post Debridement: Requires Further Debridement Severity of Tissue Post Debridement: Fat layer exposed Post Procedure  Diagnosis Same as Pre-procedure Electronic Signature(s) Signed: 05/23/2017 5:17:02 PM By: Lawanda Cousins Signed: 05/24/2017 4:55:35 PM By: Alric Quan Entered By: Alric Quan on 05/23/2017 09:29:04 Florentino, Jlen E. ([id number]) -------------------------------------------------------------------------------- Debridement Details Patient Name: [name], [name] Date of Service: 05/23/2017 8:45 AM Medical Record Number: [id number] Patient Account Number: [id number] Date of Birth/Sex: [birth year] (67 y.o. Male) Treating RN: Ahmed Prima Primary Care Provider: Myrtie Hawk Other Clinician: Referring Provider: Myrtie Hawk Treating Provider/Extender: Cathie Olden in Treatment: 34 Debridement Performed for Wound #1 Right Toe Great Assessment: Performed By: Physician Lawanda Cousins, NP Debridement: Debridement Severity of Tissue Pre Fat layer exposed Debridement:  Pre-procedure Verification/Time Yes - 09:24 Out Taken: Start Time: 09:25 Pain Control: Lidocaine 4% Topical Solution Level: Skin/Subcutaneous Tissue Total Area Debrided (L x W): 1 (cm) x 2 (cm) = 2 (cm) Tissue and other material Viable, Non-Viable, Exudate, Fibrin/Slough, Subcutaneous debrided: Instrument: Curette Bleeding: Minimum Hemostasis Achieved: Pressure End Time: 09:26 Procedural Pain: 0 Post Procedural Pain: 0 Response to Treatment: Procedure was tolerated well Post Debridement Measurements of Total Wound Length: (cm) 1 Width: (cm) 7.5 Depth: (cm) 0.2 Volume: (cm) 1.178 Character of Wound/Ulcer Post Debridement: Requires Further Debridement Severity of Tissue Post Debridement: Fat layer exposed Post Procedure Diagnosis Same as Pre-procedure Electronic Signature(s) Signed: 05/23/2017 9:35:20 AM By: Lawanda Cousins Signed: 05/24/2017 4:55:35 PM By: Alric Quan Entered By: Lawanda Cousins on 05/23/2017 09:35:20 [name], [name]  ([id number]) -------------------------------------------------------------------------------- HPI Details Patient Name: [name], [name] Date of Service: 05/23/2017 8:45 AM Medical Record Number: [id number] Patient Account Number: [id number] Date of Birth/Sex: 07-08-1950 (67 y.o. Male) Treating RN: Ahmed Prima Primary Care Provider: Myrtie Hawk Other Clinician: Referring Provider: Myrtie Hawk Treating Provider/Extender: Cathie Olden in Treatment: 34 History of Present Illness Location: bilateral feet ulceration on the toes Quality: Patient reports experiencing a dull pain to affected area(s). Severity: Patient states wound are getting better Duration: Patient has had the wound for > 3 months prior to seeking treatment at the wound center Timing: Pain in wound is constant (hurts all the time) Context: The wound would happen gradually Modifying Factors: Other treatment(s) tried include:treatment for lymphedema and is seen by the podiatrist Dr. Caryl Comes Associated Signs and Symptoms: Patient reports having increase swelling. HPI Description: 67 year old patient here to see as for bilateral feet ulceration to on his left first and second toe and 2 on his right first and second toe, which she's had for about 4 months. 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 Donella Stade. The patient is also undergoing workup by medical oncology for a intra-abdominal lymphadenopathy. 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 He is also status post appendectomy, inguinal lymph node biopsy, rectal biopsy and rectal examination under anesthesia. he currently smokes cigarettes about half packet a day. In March of this year he was seen by Dr.  Hortencia Pilar, for evaluation of bilateral varicose veins and besides wearing compression stockings he had recommended laser ablation of the right and left great saphenous veins to eleviate the symptoms and complications of severe superficial venous reflux disease. He also recommended lymphedema pumps for better control of his lymphedema. The patient recently has had on 08/23/2016, right greater saphenous vein ablation with the laser energy Earlier lower extremity venous reflux examination done on 05/08/2016 showed no DVT or SVT both lower legs but incompetence of bilateral great saphenous veins was present. A lower arterial study was also done and there was no significant right lower and left lower extremity problems based on a normal toe brachial index bilaterally and the ABI was 1.21 the left and 1.23 on the right. His post ablation venous duplex examination showed successful ablation of the right GS vein with thrombus formation 2 below the right saphenofemoral junction. The deep system was patent without evidence of thrombosis and this was done on 08/30/2016. the patient also has a squamous cell cancer of the skin of the buttock and is  recently undergone radiation therapy for this prior to excisional surgery. Addendum: regarding his x-rays done today and x-ray of the left foot -- IMPRESSION: No objective evidence of osteomyelitis. There are soft tissue changes which may reflect cellulitis. X-ray of the right foot -- IMPRESSION:Findings compatible with cellulitis of the toes. No objective evidence of osteomyelitis is observed. 10/01/16 on evaluation today patient's wounds appeared to be doing some better. I did review the x-rays as well which showed no evidence of osteomyelitis although there was evidence on x-ray of cellulitis. He fortunately is not having any discomfort although he continues to have some swelling. He does not remember being on any antibiotics recently. 10/15/16 on evaluation  today patient's wounds overall appear to be doing better although he does have a new location noted on the left foot. Fortunately he is not having significant pain. It almost has the appearance that something is rubbing on the end of his toes but he wears the open toe shoes and according to what he is telling me never wears anything that would rub on his foot. There is no evidence of infection and specifically no evidence of a fungal infection 10/22/16 On evaluation today patient's wounds appeared to be doing better compared to last week in regard to his bilateral Gillie, Richar E. ([id number]) lower extremities. Fortunately I happy with how things are progressing although he still has ulcers I feel like that he is improving and appropriate manner. 11/12/16 on evaluation today patient appears to be doing well in regard to his bilateral feet and the respective wounds. We have been using surrounding her dressings along with an antifungal cream which seems to be doing very well. He has no bilateral dysfunction noticed that the rituals are weight loss at this point. He also has no nausea or vomiting a note purulent discharge. He did see Vein and vascular today and he tells me that they told him he could have surgery for his venous stasis but they did not feel like it was worth it in his words. Fortunately patient's wounds do appear to be getting sneakily better. 11/26/2016 -- he says he is going to have some surgery during this week at [facility] for possibly a colon resection. 12/31/2016 -- the patient has been noncompliant with his smoking and I'm not sure whether he is also started drinking again. He continues to be very nonchalant about his care 01/14/2017 -- the patient's HandP has been reviewed well and I understand he is being compliant with trying to give up smoking and his local dressing changes. He does not have any surgical options of 4 to him by his vascular surgeons.he was last seen in early  August by Dr. Hortencia Pilar who recommended compression stockings,and possibly lymph pumps in 2-3 months after doing a review ultrasound. 01/28/2017 - the patient did not have any fresh complaints but on examination I noted a large lacerated wound on the plantar aspect of his right fourth toe which had a lot of necrotic debris and it probes down to bone. 02/07/2017 -- x-ray of the right foot -- IMPRESSION: Soft tissue swelling about the first through fourth toes consistent with cellulitis. New destructive change in the tuft of the distal phalanx of the great toe is consistent with osteomyelitis. 02/14/2017 -- the patient's MRI is pending this coming Monday and he still continues to smoke. We have again gone over off loading of his wounds in great detail and he says he's been compliant. 02/21/2017 -- MR of the right  foot -- IMPRESSION: 1. Soft tissue ulcer at the tip of the first, second and third toe knows. Cortical irregularity and bone marrow edema in the first distal phalanx most concerning for osteomyelitis. Mild marrow edema in the second and third distal phalanx without definite cortical destruction which may reflect early osteomyelitis versus reactive marrow edema. 2. Soft tissue edema surrounding the first phalanx most consistent with cellulitis. the patient was also recently evaluated by his medical oncologist Dr. 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 She is treating him with weekly Procrit. She is also keeping intra-abdominal lymphadenopathy and right lower lobe lung nodule under observation. 04/04/2017 -- he was seen by Dr. Adrian Prows on 03/25/2017 -- after review he empirically put him on ciprofloxacin and doxycycline as they have good bone penetration and good bioavailability and it will cover the usual pathogens and diabetic foot osteomyelitis. He will check inflammatory markers and plan a 72-49 week old  records. C-reactive protein was 0.3 and the ESR was 72 04/18/17 on evaluation today patient appears to be doing about the same in regard to his lower extremity wounds bilaterally. He has continued to use the antifungal cream which does seem to be beneficial. Nonetheless the ulcers do seem to in some areas be epithelial eyes over and in other areas are still open. He is having no significant discomfort. 04/25/17-he is here in follow-up evaluation for multiple ulcerations to multiple toes bilaterally. He states he did see Dr. Ola Spurr again last week and continues antibiotic therapy. He is voicing no complaints or concerns, will continue with current treatment plan will possibility of adding compression therapy next week after an additional week of treatment/lotions to BLE prescribed by Dr Ola Spurr 05/02/17 he is here in follow up for for multiple ulcers to multiple toes bilaterally. we will stop using antifungal cream and will continue with silvercel and follow up next week 05/09/17-he is here in follow-up for multiple ulcerations to multiple toes bilaterally. There is improvement in appearance. He has not completely stopped using antifungal cream, but admits he has not using it between the toes. He has an appointment with Dr. Ola Spurr on 2/11, continues on doxycycline and Cipro. It has been 5 weeks of antibiotic therapy, we will order plain film xray to evaluate for osteomyelitis next week, prior to follow up with ID. Will continue with silvercel and follow up next week 05/16/17-he is here in follow-up evaluation for multiple ulcerations to multiple toes bilaterally and new wound to the right posterior heel. There is essentially no change in appearance, deteriorating measurements; he has a history of waxing and waning measurements. He admits that he continues to apply moisturizer/cream/ointment to his toes despite weekly reminders to only apply silvercel to his toes. He states that he thinks the  surgical shoe contributed to the superficial ulcer to his posterior Kashani, Damain E. ([id number]) heel, he is unable to articulate if this was an area of dry cracked skin as he has a similar area to the left heel. He now is wearing open toed slippers. He has an appointment with Dr. Ola Spurr on 2/11. We have ordered x-rays for her bilateral feet; he was advised to obtain the x-rays today or tomorrow. He will follow-up next week 05/23/17-he is here in follow-up evaluation for multiple ulcerations to multiple toes bilaterally and the right posterior heel. There is improvement in maceration. He has been compliant and not applying any moisturizing agent to his toes. He  has been using Lac-Hydrin for his lower extremities with improvement. He did not go to his appointment on Monday with Dr. Ola Spurr secondary to financial concerns. X-rays for her bilateral feet showed: LEFT FOOT with slight erosion of the tuft of the distal phalanges of the left first and second toe suspicious for osteomyelitis, RIGHT FOOT with 1.erosion of the tufts of the distal phalanges of the right first second and possibly third toes consistent with osteomyelitis, 2 no definitive abnormality of the calcaneus is seen on the images obtained, 3. Plantar calcaneal degenerative spur. We briefly discussed hyperbaric adjunctive therapy for treatment of chronic refractory osteomyelitis. I do not find an a1c in EMR, will contact PCP for record, or order if needed. He has been encouraged to contact Dr Ola Spurr office regarding the follow-up appointment, encouraged him to inquire about payment plan. We will continue with same treatment plan and follow-up next week. He states he is still taking antibiotics and has "a lot" left. He states he has been taking them as directed, 2 pills twice daily. According to Dr. Blane Ohara office notes he was originally started on 12/17 for 4 weeks and extended on 1/14 for an additional 4 weeks. He should be  done with his antibiotic therapy, he was advised to bring his bottles and to his next appointment, we will contact pharmacy. Electronic Signature(s) Signed: 05/23/2017 10:26:08 AM By: Lawanda Cousins Previous Signature: 05/23/2017 10:19:59 AM Version By: Lawanda Cousins Entered By: Lawanda Cousins on 05/23/2017 10:26:07 Sarti, [name] ([id number]) -------------------------------------------------------------------------------- Physician Orders Details Patient Name: Nutting, Lebaron E. Date of Service: 05/23/2017 8:45 AM Medical Record Number: [id number] Patient Account Number: [id number] Date of Birth/Sex: Dec 10, 1950 (67 y.o. Male) Treating RN: Ahmed Prima Primary Care Provider: Myrtie Hawk Other Clinician: Referring Provider: Myrtie Hawk Treating Provider/Extender: Cathie Olden in Treatment: 84 Verbal / Phone Orders: Yes Clinician: Carolyne Fiscal, Debi Read Back and Verified: Yes Diagnosis Coding Wound Cleansing Wound #1 Right Toe Great o Clean wound with Normal Saline. o May Shower, gently pat wound dry prior to applying new dressing. Wound #11 Right Calcaneus o Clean wound with Normal Saline. o May Shower, gently pat wound dry prior to applying new dressing. Wound #2 Right Toe Second o Clean wound with Normal Saline. o May Shower, gently pat wound dry prior to applying new dressing. Wound #5 Left Toe Great o Clean wound with Normal Saline. o May Shower, gently pat wound dry prior to applying new dressing. Wound #6 Left Toe Second o Clean wound with Normal Saline. o May Shower, gently pat wound dry prior to applying new dressing. Wound #9 Right Toe Third o Clean wound with Normal Saline. o May Shower, gently pat wound dry prior to applying new dressing. Anesthetic (add to Medication List) Wound #1 Right Toe Great o Topical Lidocaine 4% cream applied to wound bed prior to debridement (In Clinic Only). Wound #11 Right Calcaneus o  Topical Lidocaine 4% cream applied to wound bed prior to debridement (In Clinic Only). Wound #2 Right Toe Second o Topical Lidocaine 4% cream applied to wound bed prior to debridement (In Clinic Only). Wound #5 Left Toe Great o Topical Lidocaine 4% cream applied to wound bed prior to debridement (In Clinic Only). Wound #6 Left Toe Second o Topical Lidocaine 4% cream applied to wound bed prior to debridement (In Clinic Only). Wound #9 Right Toe Third o Topical Lidocaine 4% cream applied to wound bed prior to debridement (In Clinic Only). Primary Wound Dressing Kushnir, Elman E. ([id number]) Wound #1 Right  Toe Great o Silvercel Non-Adherent Wound #11 Right Calcaneus o Silvercel Non-Adherent Wound #2 Right Toe Second o Silvercel Non-Adherent Wound #5 Left Toe Great o Silvercel Non-Adherent Wound #6 Left Toe Second o Silvercel Non-Adherent Wound #9 Right Toe Third o Silvercel Non-Adherent Secondary Dressing Wound #1 Right Toe Great o ABD and Kerlix/Conform Wound #11 Right Calcaneus o Conform/Kerlix o Other - allevyn heel cup Wound #2 Right Toe Second o ABD and Kerlix/Conform Wound #5 Left Toe Great o ABD and Kerlix/Conform Wound #6 Left Toe Second o ABD and Kerlix/Conform Wound #9 Right Toe Third o ABD and Kerlix/Conform Dressing Change Frequency Wound #1 Right Toe Great o Change dressing every day. Wound #11 Right Calcaneus o Change dressing every day. Wound #2 Right Toe Second o Change dressing every day. Wound #5 Left Toe Great o Change dressing every day. Wound #6 Left Toe Second o Change dressing every day. Wound #9 Right Toe Third o Change dressing every day. MEGAN, HAYDUK ([id number]) Follow-up Appointments Wound #1 Right Toe Great o Return Appointment in 1 week. Wound #11 Right Calcaneus o Return Appointment in 1 week. Wound #2 Right Toe Second o Return Appointment in 1 week. Wound #5 Left Toe Great o  Return Appointment in 1 week. Wound #6 Left Toe Second o Return Appointment in 1 week. Wound #9 Right Toe Third o Return Appointment in 1 week. Edema Control Wound #1 Right Toe Great o Patient to wear own Juxtalite/Juzo compression garment. Wound #11 Right Calcaneus o Patient to wear own Juxtalite/Juzo compression garment. Wound #2 Right Toe Second o Patient to wear own Juxtalite/Juzo compression garment. Wound #5 Left Toe Great o Patient to wear own Juxtalite/Juzo compression garment. Wound #6 Left Toe Second o Patient to wear own Juxtalite/Juzo compression garment. Wound #9 Right Toe Third o Patient to wear own Juxtalite/Juzo compression garment. Additional Orders / Instructions Wound #1 Right Toe Great o Stop Smoking o Increase protein intake. o Other: - Please add vitamin A, vitamin C and zinc supplements to your diet Wound #11 Right Calcaneus o Stop Smoking o Increase protein intake. o Other: - Please add vitamin A, vitamin C and zinc supplements to your diet Wound #2 Right Toe Second o Stop Smoking o Increase protein intake. o Other: - Please add vitamin A, vitamin C and zinc supplements to your diet Wound #5 Left Toe Great o Stop Smoking Sampey, Ridgely E. ([id number]) o Increase protein intake. o Other: - Please add vitamin A, vitamin C and zinc supplements to your diet Wound #6 Left Toe Second o Stop Smoking o Increase protein intake. o Other: - Please add vitamin A, vitamin C and zinc supplements to your diet Wound #9 Right Toe Third o Stop Smoking o Increase protein intake. o Other: - Please add vitamin A, vitamin C and zinc supplements to your diet Patient Medications Allergies: No Known Drug Allergies Notifications Medication Indication Start End lidocaine DOSE 1 - topical 4 % cream - 1 cream topical Electronic Signature(s) Signed: 05/23/2017 5:17:02 PM By: Lawanda Cousins Entered By: Lawanda Cousins on  05/23/2017 10:22:07 Rohm, [name] ([id number]) -------------------------------------------------------------------------------- Prescription 05/23/2017 Patient Name: [name] Provider: Lawanda Cousins NP Date of Birth: 1950-07-26 NPI#: [id number] Sex: Jerilynn Mages DEA#: [id number] Phone #: [phone] License #: Patient Address: [facility] [place], [place] [address] [address] [place], [place] [address] [phone] Allergies No Known Drug Allergies Medication Medication: Route: Strength: Form: lidocaine 4 % topical cream  topical 4% cream Class: TOPICAL LOCAL ANESTHETICS Dose: Frequency / Time: Indication: 1 1 cream topical Number of Refills: Number of Units: 0 Generic Substitution: Start Date: End Date: One Time Use: Substitution Permitted No Note to Pharmacy: Signature(s): Date(s): Electronic Signature(s) Signed: 05/23/2017 5:17:02 PM By: Lawanda Cousins Entered By: Lawanda Cousins on 05/23/2017 10:22:08 Barbuto, [name] ([id number]) --------------------------------------------------------------------------------  Problem List Details Patient Name: [name], [name] Date of Service: 05/23/2017 8:45 AM Medical Record Number: [id number] Patient Account Number: [id number] Date of Birth/Sex: 10-26-1950 (67 y.o. Male) Treating RN: Ahmed Prima Primary Care Provider: Myrtie Hawk Other Clinician: Referring Provider: Myrtie Hawk Treating Provider/Extender: Cathie Olden in Treatment: 1 Active Problems ICD-10 Encounter Code Description Active Date Diagnosis E11.621 Type 2 diabetes mellitus with foot ulcer 09/21/2016 Yes I87.313 Chronic venous hypertension (idiopathic) with ulcer of bilateral 09/21/2016 Yes lower extremity I89.0 Lymphedema, not elsewhere classified 09/21/2016 Yes L97.522 Non-pressure chronic ulcer of other part of left foot with fat layer  09/21/2016 Yes exposed L97.512 Non-pressure chronic ulcer of other part of right foot with fat layer 09/21/2016 Yes exposed F17.218 Nicotine dependence, cigarettes, with other nicotine-induced 09/21/2016 Yes disorders F10.19 Alcohol abuse with unspecified alcohol-induced disorder 09/21/2016 Yes M86.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, right ankle and foot 02/21/2017 Yes Inactive Problems Resolved Problems Electronic Signature(s) Signed: 05/23/2017 9:33:57 AM By: Lawanda Cousins Entered By: Lawanda Cousins on 05/23/2017 09:33:56 Ortman, [name] ([id number]) -------------------------------------------------------------------------------- Progress Note Details Patient Name: [name], [name] Date of Service: 05/23/2017 8:45 AM Medical Record Number: [id number] Patient Account Number: [id number] Date of Birth/Sex: December 13, 1950 (67 y.o. Male) Treating RN: Ahmed Prima Primary Care Provider: Myrtie Hawk Other Clinician: Referring Provider: Myrtie Hawk Treating Provider/Extender: Cathie Olden in Treatment: 77 Subjective Chief Complaint Information obtained from Patient Patient presents for treatment of an open diabetic ulcer to both feet History of Present Illness (HPI) The following HPI elements were documented for the patient's wound: Location: bilateral feet ulceration on the toes Quality: Patient reports experiencing a dull pain to affected area(s). Severity: Patient states wound are getting better Duration: Patient has had the wound for > 3 months prior to seeking treatment at the wound center Timing: Pain in wound is constant (hurts all the time) Context: The wound would happen gradually Modifying Factors: Other treatment(s) tried include:treatment for lymphedema and is seen by the podiatrist Dr. Caryl Comes Associated Signs and Symptoms: Patient reports having increase swelling. 67 year old patient here to see as for bilateral feet ulceration to on his left first and  second toe and 2 on his right first and second toe, which she's had for about 4 months. 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 Donella Stade. The patient is also undergoing workup by medical oncology for a intra-abdominal lymphadenopathy. 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 He is also status post appendectomy, inguinal lymph node biopsy, rectal biopsy and rectal examination under anesthesia. he currently smokes cigarettes about half packet a day. In March of this year he was seen by Dr. Hortencia Pilar, for evaluation of bilateral varicose veins and besides wearing compression stockings he had recommended laser ablation of the right and left great saphenous veins to eleviate the symptoms and complications of severe superficial venous reflux disease. He also recommended lymphedema pumps for better control of his lymphedema. The patient  recently has had on 08/23/2016, right greater saphenous vein ablation with the laser energy Earlier lower extremity venous reflux examination done on 05/08/2016 showed no DVT or SVT both lower legs but incompetence of bilateral great saphenous veins was present. A lower arterial study was also done and there was no significant right lower and left lower extremity problems based on a normal toe brachial index bilaterally and the ABI was 1.21 the left and 1.23 on the right. His post ablation venous duplex examination showed successful ablation of the right GS vein with thrombus formation 2 below the right saphenofemoral junction. The deep system was patent without evidence of thrombosis and this was done on 08/30/2016. the patient also has a squamous cell cancer of the skin of the buttock and is recently undergone  radiation therapy for this prior to excisional surgery. Addendum: regarding his x-rays done today and x-ray of the left foot -- IMPRESSION: No objective evidence of osteomyelitis. There are soft tissue changes which may reflect cellulitis. X-ray of the right foot -- IMPRESSION:Findings compatible with cellulitis of the toes. No objective evidence of osteomyelitis is observed. 10/01/16 on evaluation today patient's wounds appeared to be doing some better. I did review the x-rays as well which showed no evidence of osteomyelitis although there was evidence on x-ray of cellulitis. He fortunately is not having any discomfort Das, Kalyb E. ([id number]) although he continues to have some swelling. He does not remember being on any antibiotics recently. 10/15/16 on evaluation today patient's wounds overall appear to be doing better although he does have a new location noted on the left foot. Fortunately he is not having significant pain. It almost has the appearance that something is rubbing on the end of his toes but he wears the open toe shoes and according to what he is telling me never wears anything that would rub on his foot. There is no evidence of infection and specifically no evidence of a fungal infection 10/22/16 On evaluation today patient's wounds appeared to be doing better compared to last week in regard to his bilateral lower extremities. Fortunately I happy with how things are progressing although he still has ulcers I feel like that he is improving and appropriate manner. 11/12/16 on evaluation today patient appears to be doing well in regard to his bilateral feet and the respective wounds. We have been using surrounding her dressings along with an antifungal cream which seems to be doing very well. He has no bilateral dysfunction noticed that the rituals are weight loss at this point. He also has no nausea or vomiting a note purulent discharge. He did see Vein and vascular today and he  tells me that they told him he could have surgery for his venous stasis but they did not feel like it was worth it in his words. Fortunately patient's wounds do appear to be getting sneakily better. 11/26/2016 -- he says he is going to have some surgery during this week at [facility] for possibly a colon resection. 12/31/2016 -- the patient has been noncompliant with his smoking and I'm not sure whether he is also started drinking again. He continues to be very nonchalant about his care 01/14/2017 -- the patient's HandP has been reviewed well and I understand he is being compliant with trying to give up smoking and his local dressing changes. He does not have any surgical options of 4 to him by his vascular surgeons.he was last seen in early August by Dr. Hortencia Pilar who recommended compression  stockings,and possibly lymph pumps in 2-3 months after doing a review ultrasound. 01/28/2017 - the patient did not have any fresh complaints but on examination I noted a large lacerated wound on the plantar aspect of his right fourth toe which had a lot of necrotic debris and it probes down to bone. 02/07/2017 -- x-ray of the right foot -- IMPRESSION: Soft tissue swelling about the first through fourth toes consistent with cellulitis. New destructive change in the tuft of the distal phalanx of the great toe is consistent with osteomyelitis. 02/14/2017 -- the patient's MRI is pending this coming Monday and he still continues to smoke. We have again gone over off loading of his wounds in great detail and he says he's been compliant. 02/21/2017 -- MR of the right foot -- IMPRESSION: 1. Soft tissue ulcer at the tip of the first, second and third toe knows. Cortical irregularity and bone marrow edema in the first distal phalanx most concerning for osteomyelitis. Mild marrow edema in the second and third distal phalanx without definite cortical destruction which may reflect early osteomyelitis versus  reactive marrow edema. 2. Soft tissue edema surrounding the first phalanx most consistent with cellulitis. the patient was also recently evaluated by his medical oncologist Dr. 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 She is treating him with weekly Procrit. She is also keeping intra-abdominal lymphadenopathy and right lower lobe lung nodule under observation. 04/04/2017 -- he was seen by Dr. Adrian Prows on 03/25/2017 -- after review he empirically put him on ciprofloxacin and doxycycline as they have good bone penetration and good bioavailability and it will cover the usual pathogens and diabetic foot osteomyelitis. He will check inflammatory markers and plan a 48-70 week old records. C-reactive protein was 0.3 and the ESR was 72 04/18/17 on evaluation today patient appears to be doing about the same in regard to his lower extremity wounds bilaterally. He has continued to use the antifungal cream which does seem to be beneficial. Nonetheless the ulcers do seem to in some areas be epithelial eyes over and in other areas are still open. He is having no significant discomfort. 04/25/17-he is here in follow-up evaluation for multiple ulcerations to multiple toes bilaterally. He states he did see Dr. Ola Spurr again last week and continues antibiotic therapy. He is voicing no complaints or concerns, will continue with current treatment plan will possibility of adding compression therapy next week after an additional week of treatment/lotions to BLE prescribed by Dr Ola Spurr 05/02/17 he is here in follow up for for multiple ulcers to multiple toes bilaterally. we will stop using antifungal cream and will continue with silvercel and follow up next week Holloman, Shaquill E. ([id number]) 05/09/17-he is here in follow-up for multiple ulcerations to multiple toes bilaterally. There is improvement in appearance. He has not completely  stopped using antifungal cream, but admits he has not using it between the toes. He has an appointment with Dr. Ola Spurr on 2/11, continues on doxycycline and Cipro. It has been 5 weeks of antibiotic therapy, we will order plain film xray to evaluate for osteomyelitis next week, prior to follow up with ID. Will continue with silvercel and follow up next week 05/16/17-he is here in follow-up evaluation for multiple ulcerations to multiple toes bilaterally and new wound to the right posterior heel. There is essentially no change in appearance, deteriorating measurements; he has a history of waxing and waning measurements. He admits that he continues  to apply moisturizer/cream/ointment to his toes despite weekly reminders to only apply silvercel to his toes. He states that he thinks the surgical shoe contributed to the superficial ulcer to his posterior heel, he is unable to articulate if this was an area of dry cracked skin as he has a similar area to the left heel. He now is wearing open toed slippers. He has an appointment with Dr. Ola Spurr on 2/11. We have ordered x-rays for her bilateral feet; he was advised to obtain the x-rays today or tomorrow. He will follow-up next week 05/23/17-he is here in follow-up evaluation for multiple ulcerations to multiple toes bilaterally and the right posterior heel. There is improvement in maceration. He has been compliant and not applying any moisturizing agent to his toes. He has been using Lac-Hydrin for his lower extremities with improvement. He did not go to his appointment on Monday with Dr. Ola Spurr secondary to financial concerns. X-rays for her bilateral feet showed: LEFT FOOT with slight erosion of the tuft of the distal phalanges of the left first and second toe suspicious for osteomyelitis, RIGHT FOOT with 1.erosion of the tufts of the distal phalanges of the right first second and possibly third toes consistent with osteomyelitis, 2 no definitive  abnormality of the calcaneus is seen on the images obtained, 3. Plantar calcaneal degenerative spur. We briefly discussed hyperbaric adjunctive therapy for treatment of chronic refractory osteomyelitis. I do not find an a1c in EMR, will contact PCP for record, or order if needed. He has been encouraged to contact Dr Ola Spurr office regarding the follow-up appointment, encouraged him to inquire about payment plan. We will continue with same treatment plan and follow-up next week. He states he is still taking antibiotics and has "a lot" left. He states he has been taking them as directed, 2 pills twice daily. According to Dr. Blane Ohara office notes he was originally started on 12/17 for 4 weeks and extended on 1/14 for an additional 4 weeks. He should be done with his antibiotic therapy, he was advised to bring his bottles and to his next appointment, we will contact pharmacy. Patient History Information obtained from Patient. Social History Current every day smoker, Marital Status - Widowed, Alcohol Use - Daily - quit drinking about a week ago, Drug Use - No History, Caffeine Use - Moderate. Medical And Surgical History Notes Oncologic 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 Objective Constitutional Vitals Time Taken: 8:54 AM, Height: 69 in, Weight: 168 lbs, BMI: 24.8, Temperature: 98.2 F, Pulse: 76 bpm, Respiratory Rate: 18 breaths/min, Blood Pressure: 165/83 mmHg. Integumentary (Hair, Skin) Wound #1 status is Open. Original cause of wound was Gradually Appeared. The wound is located on the Right Toe Great. The wound measures 1cm length x 7.5cm width x 0.1cm depth; 5.89cm^2 area and 0.589cm^3 volume. There is Fat Layer (Subcutaneous Tissue) Exposed exposed. There is no tunneling noted. There is a large amount of serosanguineous drainage Bambrick, Xayvier E. ([id number]) noted. Foul odor after cleansing was noted. The wound margin is flat and intact. There is medium  (34-66%) red granulation within the wound bed. There is a small (1-33%) amount of necrotic tissue within the wound bed including Adherent Slough. The periwound skin appearance exhibited: Excoriation, Maceration. The periwound skin appearance did not exhibit: Callus, Crepitus, Induration, Rash, Scarring, Dry/Scaly, Atrophie Blanche, Cyanosis, Ecchymosis, Hemosiderin Staining, Mottled, Pallor, Rubor, Erythema. Periwound temperature was noted as No Abnormality. Wound #11 status is Open. Original cause of wound was Gradually Appeared. The wound  is located on the Right Calcaneus. The wound measures 0.2cm length x 0.2cm width x 0.2cm depth; 0.031cm^2 area and 0.006cm^3 volume. There is no tunneling or undermining noted. There is a large amount of serosanguineous drainage noted. The wound margin is distinct with the outline attached to the wound base. There is large (67-100%) red granulation within the wound bed. There is a small (1-33%) amount of necrotic tissue within the wound bed including Adherent Slough. The periwound skin appearance did not exhibit: Maceration. Periwound temperature was noted as No Abnormality. The periwound has tenderness on palpation. Wound #2 status is Open. Original cause of wound was Gradually Appeared. The wound is located on the Right Toe Second. The wound measures 1.8cm length x 1.7cm width x 0.1cm depth; 2.403cm^2 area and 0.24cm^3 volume. There is Fat Layer (Subcutaneous Tissue) Exposed exposed. There is no tunneling or undermining noted. There is a large amount of serosanguineous drainage noted. Foul odor after cleansing was noted. The wound margin is flat and intact. There is medium (34-66%) red granulation within the wound bed. There is a medium (34-66%) amount of necrotic tissue within the wound bed including Adherent Slough. The periwound skin appearance exhibited: Maceration. The periwound skin appearance did not exhibit: Callus, Crepitus, Excoriation, Induration,  Rash, Scarring, Dry/Scaly, Atrophie Blanche, Cyanosis, Ecchymosis, Hemosiderin Staining, Mottled, Pallor, Rubor, Erythema. Periwound temperature was noted as No Abnormality. Wound #5 status is Open. Original cause of wound was Gradually Appeared. The wound is located on the Left Toe Great. The wound measures 1.8cm length x 2.5cm width x 0.1cm depth; 3.534cm^2 area and 0.353cm^3 volume. There is Fat Layer (Subcutaneous Tissue) Exposed exposed. There is no tunneling or undermining noted. There is a large amount of serosanguineous drainage noted. Foul odor after cleansing was noted. The wound margin is flat and intact. There is medium (34-66%) red granulation within the wound bed. There is a small (1-33%) amount of necrotic tissue within the wound bed including Adherent Slough. The periwound skin appearance exhibited: Maceration. The periwound skin appearance did not exhibit: Callus, Crepitus, Excoriation, Induration, Rash, Scarring, Dry/Scaly, Atrophie Blanche, Cyanosis, Ecchymosis, Hemosiderin Staining, Mottled, Pallor, Rubor, Erythema. Periwound temperature was noted as No Abnormality. Wound #6 status is Open. Original cause of wound was Gradually Appeared. The wound is located on the Left Toe Second. The wound measures 0.9cm length x 0.6cm width x 0.1cm depth; 0.424cm^2 area and 0.042cm^3 volume. There is Fat Layer (Subcutaneous Tissue) Exposed exposed. There is no tunneling or undermining noted. There is a large amount of serosanguineous drainage noted. Foul odor after cleansing was noted. The wound margin is flat and intact. There is large (67-100%) red granulation within the wound bed. There is a small (1-33%) amount of necrotic tissue within the wound bed including Adherent Slough. The periwound skin appearance exhibited: Maceration. The periwound skin appearance did not exhibit: Callus, Crepitus, Excoriation, Induration, Rash, Scarring, Dry/Scaly, Atrophie Blanche, Cyanosis,  Ecchymosis, Hemosiderin Staining, Mottled, Pallor, Rubor, Erythema. Periwound temperature was noted as No Abnormality. Wound #9 status is Open. Original cause of wound was Gradually Appeared. The wound is located on the Right Toe Third. The wound measures 0.1cm length x 0.1cm width x 0.1cm depth; 0.008cm^2 area and 0.001cm^3 volume. There is Fat Layer (Subcutaneous Tissue) Exposed exposed. There is no tunneling or undermining noted. There is a small amount of serous drainage noted. Foul odor after cleansing was noted. The wound margin is flat and intact. There is large (67-100%) red granulation within the wound bed. There is a small (1-33%)  amount of necrotic tissue within the wound bed including Adherent Slough. The periwound skin appearance did not exhibit: Callus, Crepitus, Excoriation, Induration, Rash, Scarring, Dry/Scaly, Maceration, Atrophie Blanche, Cyanosis, Ecchymosis, Hemosiderin Staining, Mottled, Pallor, Rubor, Erythema. Periwound temperature was noted as No Abnormality. Assessment Active Problems ICD-10 E11.621 - Type 2 diabetes mellitus with foot ulcer I87.313 - Chronic venous hypertension (idiopathic) with ulcer of bilateral lower extremity Flippo, Keatyn E. ([id number]) I89.0 - Lymphedema, not elsewhere classified L97.522 - Non-pressure chronic ulcer of other part of left foot with fat layer exposed L97.512 - Non-pressure chronic ulcer of other part of right foot with fat layer exposed F17.218 - Nicotine dependence, cigarettes, with other nicotine-induced disorders F10.19 - Alcohol abuse 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 - Chronic multifocal osteomyelitis, right ankle and foot Procedures Wound #1 Pre-procedure diagnosis of Wound #1 is a Diabetic Wound/Ulcer of the Lower Extremity located on the Right Toe Great .Severity of Tissue Pre Debridement is: Fat layer exposed. There was a Skin/Subcutaneous Tissue Debridement ([id number]) debridement with total area of  2 sq cm performed by Lawanda Cousins, NP. with the following instrument(s): Curette to remove Viable and Non-Viable tissue/material including Exudate, Fibrin/Slough, and Subcutaneous after achieving pain control using Lidocaine 4% Topical Solution. A time out was conducted at 09:24, prior to the start of the procedure. A Minimum amount of bleeding was controlled with Pressure. The procedure was tolerated well with a pain level of 0 throughout and a pain level of 0 following the procedure. Post Debridement Measurements: 1cm length x 7.5cm width x 0.2cm depth; 1.178cm^3 volume. Character of Wound/Ulcer Post Debridement requires further debridement. Severity of Tissue Post Debridement is: Fat layer exposed. Post procedure Diagnosis Wound #1: Same as Pre-Procedure Wound #2 Pre-procedure diagnosis of Wound #2 is a Diabetic Wound/Ulcer of the Lower Extremity located on the Right Toe Second .Severity of Tissue Pre Debridement is: Fat layer exposed. There was a Skin/Subcutaneous Tissue Debridement ([id number]) debridement with total area of 3.06 sq cm performed by Lawanda Cousins, NP. with the following instrument(s): Curette to remove Viable and Non-Viable tissue/material including Exudate, Fibrin/Slough, and Subcutaneous after achieving pain control using Lidocaine 4% Topical Solution. A time out was conducted at 09:24, prior to the start of the procedure. A Minimum amount of bleeding was controlled with Pressure. The procedure was tolerated well with a pain level of 0 throughout and a pain level of 0 following the procedure. Post Debridement Measurements: 1.8cm length x 1.7cm width x 0.2cm depth; 0.481cm^3 volume. Character of Wound/Ulcer Post Debridement requires further debridement. Severity of Tissue Post Debridement is: Fat layer exposed. Post procedure Diagnosis Wound #2: Same as Pre-Procedure Wound #6 Pre-procedure diagnosis of Wound #6 is a Diabetic Wound/Ulcer of the Lower Extremity located on  the Left Toe Second .Severity of Tissue Pre Debridement is: Fat layer exposed. There was a Skin/Subcutaneous Tissue Debridement ([id number]) debridement with total area of 0.54 sq cm performed by Lawanda Cousins, NP. with the following instrument(s): Curette to remove Viable and Non-Viable tissue/material including Exudate, Fibrin/Slough, and Subcutaneous after achieving pain control using Lidocaine 4% Topical Solution. A time out was conducted at 09:24, prior to the start of the procedure. A Minimum amount of bleeding was controlled with Pressure. The procedure was tolerated well with a pain level of 0 throughout and a pain level of 0 following the procedure. Post Debridement Measurements: 0.9cm length x 0.6cm width x 0.2cm depth; 0.085cm^3 volume. Character of Wound/Ulcer Post Debridement requires further debridement. Severity of Tissue Post Debridement is: Fat layer exposed.  Post procedure Diagnosis Wound #6: Same as Pre-Procedure Lamy, Arshan E. ([id number]) Plan Wound Cleansing: Wound #1 Right Toe Great: Clean wound with Normal Saline. May Shower, gently pat wound dry prior to applying new dressing. Wound #11 Right Calcaneus: Clean wound with Normal Saline. May Shower, gently pat wound dry prior to applying new dressing. Wound #2 Right Toe Second: Clean wound with Normal Saline. May Shower, gently pat wound dry prior to applying new dressing. Wound #5 Left Toe Great: Clean wound with Normal Saline. May Shower, gently pat wound dry prior to applying new dressing. Wound #6 Left Toe Second: Clean wound with Normal Saline. May Shower, gently pat wound dry prior to applying new dressing. Wound #9 Right Toe Third: Clean wound with Normal Saline. May Shower, gently pat wound dry prior to applying new dressing. Anesthetic (add to Medication List): Wound #1 Right Toe Great: Topical Lidocaine 4% cream applied to wound bed prior to debridement (In Clinic Only). Wound #11 Right  Calcaneus: Topical Lidocaine 4% cream applied to wound bed prior to debridement (In Clinic Only). Wound #2 Right Toe Second: Topical Lidocaine 4% cream applied to wound bed prior to debridement (In Clinic Only). Wound #5 Left Toe Great: Topical Lidocaine 4% cream applied to wound bed prior to debridement (In Clinic Only). Wound #6 Left Toe Second: Topical Lidocaine 4% cream applied to wound bed prior to debridement (In Clinic Only). Wound #9 Right Toe Third: Topical Lidocaine 4% cream applied to wound bed prior to debridement (In Clinic Only). Primary Wound Dressing: Wound #1 Right Toe Great: Silvercel Non-Adherent Wound #11 Right Calcaneus: Silvercel Non-Adherent Wound #2 Right Toe Second: Silvercel Non-Adherent Wound #5 Left Toe Great: Silvercel Non-Adherent Wound #6 Left Toe Second: Silvercel Non-Adherent Wound #9 Right Toe Third: Silvercel Non-Adherent Secondary Dressing: Wound #1 Right Toe Great: ABD and Kerlix/Conform Wound #11 Right Calcaneus: Conform/Kerlix Other - allevyn heel cup Wound #2 Right Toe Second: ABD and Kerlix/Conform Wound #5 Left Toe Great: ABD and Kerlix/Conform Wound #6 Left Toe Second: Golladay, Hasheem E. ([id number]) ABD and Kerlix/Conform Wound #9 Right Toe Third: ABD and Kerlix/Conform Dressing Change Frequency: Wound #1 Right Toe Great: Change dressing every day. Wound #11 Right Calcaneus: Change dressing every day. Wound #2 Right Toe Second: Change dressing every day. Wound #5 Left Toe Great: Change dressing every day. Wound #6 Left Toe Second: Change dressing every day. Wound #9 Right Toe Third: Change dressing every day. Follow-up Appointments: Wound #1 Right Toe Great: Return Appointment in 1 week. Wound #11 Right Calcaneus: Return Appointment in 1 week. Wound #2 Right Toe Second: Return Appointment in 1 week. Wound #5 Left Toe Great: Return Appointment in 1 week. Wound #6 Left Toe Second: Return Appointment in 1 week. Wound  #9 Right Toe Third: Return Appointment in 1 week. Edema Control: Wound #1 Right Toe Great: Patient to wear own Juxtalite/Juzo compression garment. Wound #11 Right Calcaneus: Patient to wear own Juxtalite/Juzo compression garment. Wound #2 Right Toe Second: Patient to wear own Juxtalite/Juzo compression garment. Wound #5 Left Toe Great: Patient to wear own Juxtalite/Juzo compression garment. Wound #6 Left Toe Second: Patient to wear own Juxtalite/Juzo compression garment. Wound #9 Right Toe Third: Patient to wear own Juxtalite/Juzo compression garment. Additional Orders / Instructions: Wound #1 Right Toe Great: Stop Smoking Increase protein intake. Other: - Please add vitamin A, vitamin C and zinc supplements to your diet Wound #11 Right Calcaneus: Stop Smoking Increase protein intake. Other: - Please add vitamin A, vitamin C and zinc supplements to your  diet Wound #2 Right Toe Second: Stop Smoking Increase protein intake. Other: - Please add vitamin A, vitamin C and zinc supplements to your diet Wound #5 Left Toe Great: Stop Smoking Increase protein intake. Other: - Please add vitamin A, vitamin C and zinc supplements to your diet Wound #6 Left Toe Second: Basil, Jayquan E. ([id number]) Stop Smoking Increase protein intake. Other: - Please add vitamin A, vitamin C and zinc supplements to your diet Wound #9 Right Toe Third: Stop Smoking Increase protein intake. Other: - Please add vitamin A, vitamin C and zinc supplements to your diet The following medication(s) was prescribed: lidocaine topical 4 % cream 1 1 cream topical was prescribed at facility 1. continue with silvercel 2. continue with antibiotics per Dr Ola Spurr 3. follow up with ID 4. follow up next week Electronic Signature(s) Signed: 05/23/2017 10:26:55 AM By: Lawanda Cousins Entered By: Lawanda Cousins on 05/23/2017 10:26:55 Byrnes, [name]  ([id number]) -------------------------------------------------------------------------------- ROS/PFSH Details Patient Name: [name], [name] Date of Service: 05/23/2017 8:45 AM Medical Record Number: [id number] Patient Account Number: [id number] Date of Birth/Sex: 1950/06/25 (67 y.o. Male) Treating RN: Ahmed Prima Primary Care Provider: Myrtie Hawk Other Clinician: Referring Provider: Myrtie Hawk Treating Provider/Extender: Cathie Olden in Treatment: 32 Information Obtained From Patient Wound History Do you currently have one or more open woundso Yes How many open wounds do you currently haveo 6 Approximately how long have you had your woundso 3 months How have you been treating your wound(s) until nowo ointment and bandage Has your wound(s) ever healed and then re-openedo No Have you had any lab work done in the past montho No Have you tested positive for an antibiotic resistant organism (MRSA, VRE)o No Have you tested positive for osteomyelitis (bone infection)o No Have you had any tests for circulation on your legso Yes Who ordered the testo PCP Where was the test doneo [facility] Medical History: Negative for: Cataracts; Glaucoma; Optic Neuritis Ear/Nose/Mouth/Throat Medical History: Negative for: Chronic sinus problems/congestion; Middle ear problems Hematologic/Lymphatic Medical History: Positive for: Anemia; Lymphedema Negative for: Hemophilia; Human Immunodeficiency Virus; Sickle Cell Disease Respiratory Medical History: Negative for: Aspiration; Asthma; Chronic Obstructive Pulmonary Disease (COPD); Pneumothorax; Sleep Apnea; Tuberculosis Cardiovascular Medical History: Positive for: Congestive Heart Failure; Hypertension; Peripheral Venous Disease Negative for: Angina; Arrhythmia; Coronary Artery Disease; Deep Vein Thrombosis; Hypotension; Myocardial Infarction; Peripheral Arterial Disease; Phlebitis;  Vasculitis Gastrointestinal Medical History: Negative for: Cirrhosis ; Colitis; Crohnos; Hepatitis A; Hepatitis B; Hepatitis C [name], [name] ([id number]) Endocrine Medical History: Positive for: Type II Diabetes Treated with: Oral agents Blood sugar tested every day: Yes Tested : QD Genitourinary Medical History: Negative for: End Stage Renal Disease Immunological Medical History: Negative for: Lupus Erythematosus; Raynaudos; Scleroderma Integumentary (Skin) Medical History: Negative for: History of Burn; History of pressure wounds Musculoskeletal Medical History: Negative for: Gout; Rheumatoid Arthritis; Osteoarthritis; Osteomyelitis Neurologic Medical History: Positive for: Neuropathy Negative for: Dementia; Quadriplegia; Paraplegia; Seizure Disorder Oncologic Medical History: Past Medical History Notes: 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 Electronic  Signature(s) Signed: 05/23/2017 5:17:02 PM By: Lawanda Cousins Signed: 05/24/2017 4:55:35 PM By: Warnell Bureau ([id number]) Entered By: Lawanda Cousins on 05/23/2017 10:20:20 Mcclain, [name] ([id number]) -------------------------------------------------------------------------------- SuperBill Details Patient Name: [name], [name] Date of Service: 05/23/2017 Medical Record Number: [id number] Patient Account  Number: [id number] Date of Birth/Sex: Aug 28, 1950 (67 y.o. 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.512 Non-pressure chronic ulcer of other part of right foot with fat layer exposed F17.218 Nicotine dependence, cigarettes, 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 abuse 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, right ankle and foot Facility Procedures CPT4 Code Description: 93716967 11042 - DEB SUBQ TISSUE 20 SQ CM/< ICD-10 Diagnosis Description L97.522 Non-pressure chronic ulcer of other part of left foot with fat l L97.512 Non-pressure chronic ulcer of other part of right foot with fat M86.371  Chronic multifocal osteomyelitis, right ankle and foot I87.313 Chronic venous hypertension (idiopathic) with ulcer of bilateral Modifier: ayer exposed layer exposed lower extremi Quantity: 1 ty Physician Procedures CPT4 Code Description: 8938101 75102 - WC PHYS SUBQ TISS 20 SQ CM ICD-10 Diagnosis Description L97.522 Non-pressure chronic ulcer of other part of left foot with fat l L97.512 Non-pressure chronic ulcer of other part of right foot with fat M86.371  Chronic multifocal osteomyelitis, right ankle and foot I87.313 Chronic venous hypertension (idiopathic) with ulcer of bilateral Modifier: ayer exposed layer exposed lower extremi Quantity: 1 ty Electronic Signature(s) Signed: 05/23/2017 10:27:43 AM By: Lawanda Cousins Entered By: Lawanda Cousins on 05/23/2017 10:27:42

## 2017-05-26 NOTE — Progress Notes (Signed)
STARLIN, STEIB ([id number]) Visit Report for 05/23/2017 Arrival Information Details Patient Name: [name], [name] Date of Service: 05/23/2017 8:45 AM Medical Record Number: [id number] Patient Account Number: [id number] Date of Birth/Sex: 1950-04-22 (67 y.o. Male) Treating RN: Ahmed Prima Primary Care Josefine Fuhr: Myrtie Hawk Other Clinician: Referring Christyana Corwin: Myrtie Hawk Treating Bryleigh Ottaway/Extender: Cathie Olden in Treatment: 73 Visit Information History Since Last Visit All ordered tests and consults were completed: No Patient Arrived: Ambulatory Added or deleted any medications: No Arrival Time: 08:51 Any new allergies or adverse reactions: No Accompanied By: family Had a fall or experienced change in No Transfer Assistance: None activities of daily living that may affect Patient Identification Verified: Yes risk of falls: Secondary Verification Process Completed: Yes Signs or symptoms of abuse/neglect since last visito No Patient Requires Transmission-Based No Hospitalized since last visit: No Precautions: Has Dressing in Place as Prescribed: Yes Patient Has Alerts: Yes Pain Present Now: No Patient Alerts: DMII Electronic Signature(s) Signed: 05/24/2017 4:55:35 PM By: Alric Quan Entered By: Alric Quan on 05/23/2017 08:51:58 Yore, [name] ([id number]) -------------------------------------------------------------------------------- Encounter Discharge Information Details Patient Name: [name], [name] Date of Service: 05/23/2017 8:45 AM Medical Record Number: [id number] Patient Account Number: [id number] Date of Birth/Sex: April 18, 1950 (67 y.o. Male) Treating RN: Ahmed Prima Primary Care Britlee Skolnik: Myrtie Hawk Other Clinician: Referring Niamya Vittitow: Myrtie Hawk Treating Ammie Warrick/Extender: Cathie Olden in Treatment: 101 Encounter Discharge Information Items Discharge Pain Level: 0 Discharge Condition:  Stable Ambulatory Status: Ambulatory Discharge Destination: Home Transportation: Private Auto Accompanied By: family Schedule Follow-up Appointment: Yes Medication Reconciliation completed and No provided to Patient/Care Rashun Grattan: Provided on Clinical Summary of Care: 05/23/2017 Form Type Recipient Paper Patient ED Electronic Signature(s) Signed: 05/23/2017 10:36:44 AM By: Alric Quan Entered By: Alric Quan on 05/23/2017 10:36:43 Allshouse, [name] ([id number]) -------------------------------------------------------------------------------- Lower Extremity Assessment Details Patient Name: [name], [name] Date of Service: 05/23/2017 8:45 AM Medical Record Number: [id number] Patient Account Number: [id number] Date of Birth/Sex: 11-22-1950 (67 y.o. Male) Treating RN: Ahmed Prima Primary Care Jaidev Sanger: Myrtie Hawk Other Clinician: Referring Nikkol Pai: Myrtie Hawk Treating Francys Bolin/Extender: Cathie Olden in Treatment: 34 Vascular Assessment Pulses: Dorsalis Pedis Palpable: [Left:Yes] [Right:Yes] Posterior Tibial Extremity colors, hair growth, and conditions: Extremity Color: [Left:Hyperpigmented] [Right:Hyperpigmented] Temperature of Extremity: [Left:Warm] [Right:Warm] Capillary Refill: [Left:< 3 seconds] [Right:< 3 seconds] Toe Nail Assessment Left: Right: Thick: Yes Yes Discolored: No No Deformed: No No Improper Length and Hygiene: Yes Yes Electronic Signature(s) Signed: 05/24/2017 4:55:35 PM By: Alric Quan Entered By: Alric Quan on 05/23/2017 09:08:13 [name], [name] ([id number]) -------------------------------------------------------------------------------- Multi Wound Chart Details Patient Name: [name], [name] Date of Service: 05/23/2017 8:45 AM Medical Record Number: [id number] Patient Account Number: [id number] Date of Birth/Sex: [birth year] (67 y.o. Male) Treating RN: Ahmed Prima Primary Care Isiaha Greenup:  Myrtie Hawk Other Clinician: Referring Moustapha Tooker: Myrtie Hawk Treating Corgan Mormile/Extender: Cathie Olden in Treatment: 34 Vital Signs Height(in): 89 Pulse(bpm): 37 Weight(lbs): 168 Blood Pressure(mmHg): 165/83 Body Mass Index(BMI): 25 Temperature(F): 98.2 Respiratory Rate 18 (breaths/min): Photos: [1:No Photos] [11:No Photos] [2:No Photos] Wound Location: [1:Right Toe Great] [11:Right Calcaneus] [2:Right Toe Second] Wounding Event: [1:Gradually Appeared] [11:Gradually Appeared] [2:Gradually Appeared] Primary Etiology: [1:Diabetic Wound/Ulcer of the Lower Extremity] [11:Diabetic Wound/Ulcer of the Lower Extremity] [2:Diabetic Wound/Ulcer of the Lower Extremity] Comorbid History: [1:Anemia, Lymphedema, Congestive Heart Failure, Hypertension, Peripheral Venous Disease, Type II Diabetes, Neuropathy] [11:Anemia, Lymphedema, Congestive Heart Failure, Hypertension, Peripheral Venous Disease, Type II Diabetes,  Neuropathy] [2:Anemia, Lymphedema, Congestive Heart Failure, Hypertension, Peripheral Venous Disease, Type II Diabetes, Neuropathy] Date Acquired: [1:06/11/2016] [  11:05/09/2017] [2:06/11/2016] Weeks of Treatment: [1:34] [11:1] [2:34] Wound Status: [1:Open] [11:Open] [2:Open] Pending Amputation on [1:Yes] [11:No] [2:Yes] Presentation: Measurements L x W x D [1:1x7.5x0.1] [11:0.2x0.2x0.2] [2:1.8x1.7x0.1] (cm) Area (cm) : [1:5.89] [11:0.031] [2:2.403] Volume (cm) : [1:0.589] [11:0.006] [2:0.24] % Reduction in Area: [1:87.50%] [11:92.10%] [2:21.80%] % Reduction in Volume: [1:87.50%] [11:84.60%] [2:21.80%] Classification: [1:Grade 2] [11:Grade 2] [2:Grade 2] Exudate Amount: [1:Large] [11:Large] [2:Large] Exudate Type: [1:Serosanguineous] [11:Serosanguineous] [2:Serosanguineous] Exudate Color: [1:red, brown] [11:red, brown] [2:red, brown] Foul Odor After Cleansing: [1:Yes] [11:No] [2:Yes] Odor Anticipated Due to [1:No] [11:N/A] [2:No] Product Use: Wound  Margin: [1:Flat and Intact] [11:Distinct, outline attached] [2:Flat and Intact] Granulation Amount: [1:Medium (34-66%)] [11:Large (67-100%)] [2:Medium (34-66%)] Granulation Quality: [1:Red] [11:Red] [2:Red] Necrotic Amount: [1:Small (1-33%)] [11:Small (1-33%)] [2:Medium (34-66%)] Exposed Structures: [1:Fat Layer (Subcutaneous Tissue) Exposed: Yes Fascia: No Tendon: No Muscle: No] [11:Fascia: No Fat Layer (Subcutaneous Tissue) Exposed: No Tendon: No Muscle: No] [2:Fat Layer (Subcutaneous Tissue) Exposed: Yes Fascia: No Tendon: No Muscle: No] Joint: No Joint: No Joint: No Bone: No Bone: No Bone: No Epithelialization: None None None Debridement: Debridement ([id number]) N/A Debridement ([id number]) Pre-procedure 09:24 N/A 09:24 Verification/Time Out Taken: Pain Control: Lidocaine 4% Topical Solution N/A Lidocaine 4% Topical Solution Tissue Debrided: Fibrin/Slough, Exudates, N/A Fibrin/Slough, Exudates, Subcutaneous Subcutaneous Level: Skin/Subcutaneous Tissue N/A Skin/Subcutaneous Tissue Debridement Area (sq cm): 2 N/A 3.06 Instrument: Curette N/A Curette Bleeding: Minimum N/A Minimum Hemostasis Achieved: Pressure N/A Pressure Procedural Pain: 0 N/A 0 Post Procedural Pain: 0 N/A 0 Debridement Treatment Procedure was tolerated well N/A Procedure was tolerated well Response: Post Debridement 1x7.5x0.2 N/A 1.8x1.7x0.2 Measurements L x W x D (cm) Post Debridement Volume: 1.178 N/A 0.481 (cm) Periwound Skin Texture: Excoriation: Yes No Abnormalities Noted Excoriation: No Induration: No Induration: No Callus: No Callus: No Crepitus: No Crepitus: No Rash: No Rash: No Scarring: No Scarring: No Periwound Skin Moisture: Maceration: Yes Maceration: No Maceration: Yes Dry/Scaly: No Dry/Scaly: No Periwound Skin Color: Atrophie Blanche: No No Abnormalities Noted Atrophie Blanche: No Cyanosis: No Cyanosis: No Ecchymosis: No Ecchymosis: No Erythema: No Erythema:  No Hemosiderin Staining: No Hemosiderin Staining: No Mottled: No Mottled: No Pallor: No Pallor: No Rubor: No Rubor: No Temperature: No Abnormality No Abnormality No Abnormality Tenderness on Palpation: No Yes No Wound Preparation: Ulcer Cleansing: Ulcer Cleansing: Ulcer Cleansing: Rinsed/Irrigated with Saline Rinsed/Irrigated with Saline Rinsed/Irrigated with Saline Topical Anesthetic Applied: Topical Anesthetic Applied: Topical Anesthetic Applied: Other: lidocaine 4% Other: lidocaine 4% Other: lidocaine 4% Procedures Performed: Debridement N/A Debridement Wound Number: [id number]  Photos: No Photos No Photos No Photos Wound Location: Left Toe Great Left Toe Second Right Toe Third Wounding Event: Gradually Appeared Gradually Appeared Gradually Appeared Primary Etiology: Diabetic Wound/Ulcer of 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 Lower Extremity Lower Extremity Comorbid History: Anemia, Lymphedema, Anemia, Lymphedema, Anemia, Lymphedema, Congestive Heart Failure, Congestive Heart Failure, Congestive Heart Failure, Hypertension, Peripheral Hypertension, Peripheral Hypertension, Peripheral Maybury, Jaiden E. ([id number]) Venous Disease, Type II Venous Disease, Type II Venous Disease, Type II Diabetes, Neuropathy Diabetes, Neuropathy Diabetes, Neuropathy Date Acquired: 06/11/2016 06/11/2016 12/31/2016 Weeks of Treatment: 34 34 20 Wound Status: Open Open Open Pending Amputation on Yes Yes No Presentation: Measurements L x W x D 1.8x2.5x0.1 0.9x0.6x0.1 0.1x0.1x0.1 (cm) Area (cm) : 3.534 0.424 0.008 Volume (cm) : 0.353 0.042 0.001 % Reduction in Area: 75.30% 80.10% 98.00% % Reduction in Volume: 75.30% 80.40% 97.40% Classification: Grade 2 Grade 2 Grade 2 Exudate Amount: Large Large Small Exudate Type: Serosanguineous Serosanguineous Serous Exudate Color: red, brown  red, brown amber Foul Odor After Cleansing: Yes Yes Yes Odor Anticipated Due to No  No No Product Use: Wound Margin: Flat and Intact Flat and Intact Flat and Intact Granulation Amount: Medium (34-66%) Large (67-100%) Large (67-100%) Granulation Quality: Red Red Red Necrotic Amount: Small (1-33%) Small (1-33%) Small (1-33%) Exposed Structures: Fat Layer (Subcutaneous Fat Layer (Subcutaneous Fat Layer (Subcutaneous Tissue) Exposed: Yes Tissue) Exposed: Yes Tissue) Exposed: Yes Fascia: No Fascia: No Fascia: No Tendon: No Tendon: No Tendon: No Muscle: No Muscle: No Muscle: No Joint: No Joint: No Joint: No Bone: No Bone: No Bone: No Epithelialization: None None None Debridement: N/A Debridement ([id number]) N/A Pre-procedure N/A 09:24 N/A Verification/Time Out Taken: Pain Control: N/A Lidocaine 4% Topical Solution N/A Tissue Debrided: N/A Fibrin/Slough, Exudates, N/A Subcutaneous Level: N/A Skin/Subcutaneous Tissue N/A Debridement Area (sq cm): N/A 0.54 N/A Instrument: N/A Curette N/A Bleeding: N/A Minimum N/A Hemostasis Achieved: N/A Pressure N/A Procedural Pain: N/A 0 N/A Post Procedural Pain: N/A 0 N/A Debridement Treatment N/A Procedure was tolerated well N/A Response: Post Debridement N/A 0.9x0.6x0.2 N/A Measurements L x W x D (cm) Post Debridement Volume: N/A 0.085 N/A (cm) Periwound Skin Texture: Excoriation: No Excoriation: No Excoriation: No Induration: No Induration: No Induration: No Callus: No Callus: No Callus: No Crepitus: No Crepitus: No Crepitus: No [name], [name] ([id number]) Rash: No Rash: No Rash: No Scarring: No Scarring: No Scarring: No Periwound Skin Moisture: Maceration: Yes Maceration: Yes Maceration: No Dry/Scaly: No Dry/Scaly: No Dry/Scaly: No Periwound Skin Color: Atrophie Blanche: No Atrophie Blanche: No Atrophie Blanche: No Cyanosis: No Cyanosis: No Cyanosis: No Ecchymosis: No Ecchymosis: No Ecchymosis: No Erythema: No Erythema: No Erythema: No Hemosiderin Staining: No Hemosiderin  Staining: No Hemosiderin Staining: No Mottled: No Mottled: No Mottled: No Pallor: No Pallor: No Pallor: No Rubor: No Rubor: No Rubor: No Temperature: No Abnormality No Abnormality No Abnormality Tenderness on Palpation: No No No Wound Preparation: Ulcer Cleansing: Ulcer Cleansing: Ulcer Cleansing: Rinsed/Irrigated with Saline Rinsed/Irrigated with Saline Rinsed/Irrigated with Saline Topical Anesthetic Applied: Topical Anesthetic Applied: Topical Anesthetic Applied: Other: lidocaine 4% Other: lidocaine 4% Other: lidocaine 4% Procedures Performed: N/A Debridement N/A Treatment Notes Electronic Signature(s) Signed: 05/23/2017 9:34:03 AM By: Lawanda Cousins Entered By: Lawanda Cousins on 05/23/2017 09:34:03 [name], [name] ([id number]) -------------------------------------------------------------------------------- [place] Details Patient Name: [name], [name] Date of Service: 05/23/2017 8:45 AM Medical Record Number: [id number] Patient Account Number: [id number] Date of Birth/Sex: 07-31-1950 (67 y.o. Male) Treating RN: Ahmed Prima Primary Care Kennis Buell: Myrtie Hawk Other Clinician: Referring Jyron Turman: Myrtie Hawk Treating Cyndee Giammarco/Extender: Cathie Olden in Treatment: 41 Active Inactive ` Abuse / Safety / Falls / Self Care Management Nursing Diagnoses: Potential for falls Goals: Patient will remain injury free related to falls Date Initiated: 09/21/2016 Target Resolution Date: 11/30/2016 Goal Status: Active Interventions: Assess fall risk on admission and as needed Notes: ` Nutrition Nursing Diagnoses: Potential for alteratiion in Nutrition/Potential for imbalanced nutrition Goals: Patient/caregiver agrees to and verbalizes understanding of need to use nutritional supplements and/or vitamins as prescribed Date Initiated: 09/21/2016 Target Resolution Date: 11/30/2016 Goal Status: Active Interventions: Assess patient  nutrition upon admission and as needed per policy Notes: ` Orientation to the Wound Care Program Nursing Diagnoses: Knowledge deficit related to the wound healing center program Goals: Patient/caregiver will verbalize understanding of the [place] Program Date Initiated: 09/21/2016 Target Resolution Date: 11/30/2016 Goal Status: Active Interventions: DARKDestiny, Trickey ([id number]) Provide education on orientation to the wound center Notes: ` Wound/Skin Impairment Nursing Diagnoses: Knowledge deficit  related to smoking impact on wound healing Goals: Ulcer/skin breakdown will have a volume reduction of 30% by week 4 Date Initiated: 09/21/2016 Target Resolution Date: 11/30/2016 Goal Status: Active Ulcer/skin breakdown will have a volume reduction of 50% by week 8 Date Initiated: 09/21/2016 Target Resolution Date: 11/30/2016 Goal Status: Active Ulcer/skin breakdown will have a volume reduction of 80% by week 12 Date Initiated: 09/21/2016 Target Resolution Date: 11/30/2016 Goal Status: Active Ulcer/skin breakdown will heal within 14 weeks Date Initiated: 09/21/2016 Target Resolution Date: 11/30/2016 Goal Status: Active Interventions: Assess patient/caregiver ability to obtain necessary supplies Assess patient/caregiver ability to perform ulcer/skin care regimen upon admission and as needed Assess ulceration(s) every visit Notes: Electronic Signature(s) Signed: 05/24/2017 4:55:35 PM By: Alric Quan Entered By: Alric Quan on 05/23/2017 09:21:26 [name], [name] ([id number]) -------------------------------------------------------------------------------- Pain Assessment Details Patient Name: [name], [name] Date of Service: 05/23/2017 8:45 AM Medical Record Number: [id number] Patient Account Number: [id number] Date of Birth/Sex: 01-Mar-1951 (67 y.o. Male) Treating RN: Ahmed Prima Primary Care Elliett Guarisco: Myrtie Hawk Other Clinician: Referring Tareq Dwan:  Myrtie Hawk Treating Rosbel Buckner/Extender: Cathie Olden in Treatment: 46 Active Problems Location of Pain Severity and Description of Pain Patient Has Paino No Site Locations Pain Management and Medication Current Pain Management: Electronic Signature(s) Signed: 05/24/2017 4:55:35 PM By: Alric Quan Entered By: Alric Quan on 05/23/2017 08:52:04 [name], [name] ([id number]) -------------------------------------------------------------------------------- Patient/Caregiver Education Details Patient Name: Dasaro, Mako E. Date of Service: 05/23/2017 8:45 AM Medical Record Number: [id number] Patient Account Number: [id number] Date of Birth/Gender: 07/12/1950 (67 y.o. Male) Treating RN: Ahmed Prima Primary Care Physician: Myrtie Hawk Other Clinician: Referring Physician: Myrtie Hawk Treating Physician/Extender: Cathie Olden in Treatment: 87 Education Assessment Education Provided To: Patient Education Topics Provided Wound/Skin Impairment: Handouts: Caring for Your Ulcer, Other: change dressing as ordered Methods: Demonstration, Explain/Verbal Responses: State content correctly Electronic Signature(s) Signed: 05/24/2017 4:55:35 PM By: Alric Quan Entered By: Alric Quan on 05/23/2017 10:36:59 [name], [name] ([id number]) -------------------------------------------------------------------------------- Wound Assessment Details Patient Name: Ortega, Kiptyn E. Date of Service: 05/23/2017 8:45 AM Medical Record Number: [id number] Patient Account Number: [id number] Date of Birth/Sex: [birth year] (67 y.o. Male) Treating RN: Ahmed Prima Primary Care Eryanna Regal: Myrtie Hawk Other Clinician: Referring Alise Calais: Myrtie Hawk Treating Saniyya Gau/Extender: Cathie Olden in Treatment: 34 Wound Status Wound Number: 1 Primary Diabetic Wound/Ulcer of the Lower Extremity Etiology: Wound Location: Right Toe  Great Wound Open Wounding Event: Gradually Appeared Status: Date Acquired: 06/11/2016 Comorbid Anemia, Lymphedema, Congestive Heart Weeks Of Treatment: 34 History: Failure, Hypertension, Peripheral Venous Clustered Wound: No Disease, Type II Diabetes, Neuropathy Pending Amputation On Presentation Photos Photo Uploaded By: Alric Quan on 05/23/2017 13:25:05 Wound Measurements Length: (cm) 1 Width: (cm) 7.5 Depth: (cm) 0.1 Area: (cm) 5.89 Volume: (cm) 0.589 % Reduction in Area: 87.5% % Reduction in Volume: 87.5% Epithelialization: None Tunneling: No Wound Description Classification: Grade 2 Wound Margin: Flat and Intact Exudate Amount: Large Exudate Type: Serosanguineous Exudate Color: red, brown Foul Odor After Cleansing: Yes Due to Product Use: No Slough/Fibrino No Wound Bed Granulation Amount: Medium (34-66%) Exposed Structure Granulation Quality: Red Fascia Exposed: No Necrotic Amount: Small (1-33%) Fat Layer (Subcutaneous Tissue) Exposed: Yes Necrotic Quality: Adherent Slough Tendon Exposed: No Muscle Exposed: No Joint Exposed: No Bone Exposed: No Periwound Skin Texture Pichette, Odas E. ([id number]) Texture Color No Abnormalities Noted: No No Abnormalities Noted: No Callus: No Atrophie Blanche: No Crepitus: No Cyanosis: No Excoriation: Yes Ecchymosis: No Induration: No Erythema: No Rash: No Hemosiderin Staining: No Scarring: No Mottled: No Pallor: No  Moisture Rubor: No No Abnormalities Noted: No Dry / Scaly: No Temperature / Pain Maceration: Yes Temperature: No Abnormality Wound Preparation Ulcer Cleansing: Rinsed/Irrigated with Saline Topical Anesthetic Applied: Other: lidocaine 4%, Treatment Notes Wound #1 (Right Toe Great) 1. Cleansed with: Clean wound with Normal Saline 2. Anesthetic Topical Lidocaine 4% cream to wound bed prior to debridement 4. Dressing Applied: Other dressing (specify in notes) 5. Secondary Dressing  Applied ABD Pad Kerlix/Conform 7. Secured with Tape Notes silvercel Electronic Signature(s) Signed: 05/24/2017 4:55:35 PM By: Alric Quan Entered By: Alric Quan on 05/23/2017 09:02:07 [name], [name] ([id number]) -------------------------------------------------------------------------------- Wound Assessment Details Patient Name: [name], Bradan E. Date of Service: 05/23/2017 8:45 AM Medical Record Number: [id number] Patient Account Number: [id number] Date of Birth/Sex: 06-07-1950 (67 y.o. Male) Treating RN: Ahmed Prima Primary Care Viva Gallaher: Myrtie Hawk Other Clinician: Referring Melony Tenpas: Myrtie Hawk Treating Araya Roel/Extender: Cathie Olden in Treatment: 34 Wound Status Wound Number: 11 Primary Diabetic Wound/Ulcer of the Lower Extremity Etiology: Wound Location: Right Calcaneus Wound Open Wounding Event: Gradually Appeared Status: Date Acquired: 05/09/2017 Comorbid Anemia, Lymphedema, Congestive Heart Weeks Of Treatment: 1 History: Failure, Hypertension, Peripheral Venous Clustered Wound: No Disease, Type II Diabetes, Neuropathy Photos Photo Uploaded By: Alric Quan on 05/23/2017 13:25:44 Wound Measurements Length: (cm) 0.2 Width: (cm) 0.2 Depth: (cm) 0.2 Area: (cm) 0.031 Volume: (cm) 0.006 % Reduction in Area: 92.1% % Reduction in Volume: 84.6% Epithelialization: None Tunneling: No Undermining: No Wound Description Classification: Grade 2 Wound Margin: Distinct, outline attached Exudate Amount: Large Exudate Type: Serosanguineous Exudate Color: red, brown Foul Odor After Cleansing: No Slough/Fibrino Yes Wound Bed Granulation Amount: Large (67-100%) Exposed Structure Granulation Quality: Red Fascia Exposed: No Necrotic Amount: Small (1-33%) Fat Layer (Subcutaneous Tissue) Exposed: No Necrotic Quality: Adherent Slough Tendon Exposed: No Muscle Exposed: No Joint Exposed: No Bone Exposed: No Periwound Skin  Texture Ordway, Rishikesh E. ([id number]) Texture Color No Abnormalities Noted: No No Abnormalities Noted: No Moisture Temperature / Pain No Abnormalities Noted: No Temperature: No Abnormality Maceration: No Tenderness on Palpation: Yes Wound Preparation Ulcer Cleansing: Rinsed/Irrigated with Saline Topical Anesthetic Applied: Other: lidocaine 4%, Treatment Notes Wound #11 (Right Calcaneus) 1. Cleansed with: Clean wound with Normal Saline 2. Anesthetic Topical Lidocaine 4% cream to wound bed prior to debridement 4. Dressing Applied: Other dressing (specify in notes) 5. Secondary Dressing Applied Kerlix/Conform 7. Secured with Tape Notes silvercel, heel cup Electronic Signature(s) Signed: 05/24/2017 4:55:35 PM By: Alric Quan Entered By: Alric Quan on 05/23/2017 09:04:23 Cartelli, [name] ([id number]) -------------------------------------------------------------------------------- Wound Assessment Details Patient Name: Buddenhagen, Ethridge E. Date of Service: 05/23/2017 8:45 AM Medical Record Number: [id number] Patient Account Number: [id number] Date of Birth/Sex: 1950/05/09 (67 y.o. Male) Treating RN: Ahmed Prima Primary Care Lashann Hagg: Myrtie Hawk Other Clinician: Referring Allecia Bells: Myrtie Hawk Treating Floreen Teegarden/Extender: Cathie Olden in Treatment: 34 Wound Status Wound Number: 2 Primary Diabetic Wound/Ulcer of the Lower Extremity Etiology: Wound Location: Right Toe Second Wound Open Wounding Event: Gradually Appeared Status: Date Acquired: 06/11/2016 Comorbid Anemia, Lymphedema, Congestive Heart Weeks Of Treatment: 34 History: Failure, Hypertension, Peripheral Venous Clustered Wound: No Disease, Type II Diabetes, Neuropathy Pending Amputation On Presentation Photos Photo Uploaded By: Alric Quan on 05/23/2017 13:25:45 Wound Measurements Length: (cm) 1.8 Width: (cm) 1.7 Depth: (cm) 0.1 Area: (cm) 2.403 Volume: (cm) 0.24 %  Reduction in Area: 21.8% % Reduction in Volume: 21.8% Epithelialization: None Tunneling: No Undermining: No Wound Description Classification: Grade 2 Wound Margin: Flat and Intact Exudate Amount: Large Exudate Type: Serosanguineous Exudate Color: red, brown Foul Odor After  Cleansing: Yes Due to Product Use: No Slough/Fibrino No Wound Bed Granulation Amount: Medium (34-66%) Exposed Structure Granulation Quality: Red Fascia Exposed: No Necrotic Amount: Medium (34-66%) Fat Layer (Subcutaneous Tissue) Exposed: Yes Necrotic Quality: Adherent Slough Tendon Exposed: No Muscle Exposed: No Joint Exposed: No Bone Exposed: No Periwound Skin Texture Mccandlish, Lindsay E. ([id number]) Texture Color No Abnormalities Noted: No No Abnormalities Noted: No Callus: No Atrophie Blanche: No Crepitus: No Cyanosis: No Excoriation: No Ecchymosis: No Induration: No Erythema: No Rash: No Hemosiderin Staining: No Scarring: No Mottled: No Pallor: No Moisture Rubor: No No Abnormalities Noted: No Dry / Scaly: No Temperature / Pain Maceration: Yes Temperature: No Abnormality Wound Preparation Ulcer Cleansing: Rinsed/Irrigated with Saline Topical Anesthetic Applied: Other: lidocaine 4%, Treatment Notes Wound #2 (Right Toe Second) 1. Cleansed with: Clean wound with Normal Saline 2. Anesthetic Topical Lidocaine 4% cream to wound bed prior to debridement 4. Dressing Applied: Other dressing (specify in notes) 5. Secondary Dressing Applied ABD Pad Kerlix/Conform 7. Secured with Tape Notes silvercel Electronic Signature(s) Signed: 05/24/2017 4:55:35 PM By: Alric Quan Entered By: Alric Quan on 05/23/2017 09:04:41 Kanaan, [name] ([id number]) -------------------------------------------------------------------------------- Wound Assessment Details Patient Name: Licausi, Zebadiah E. Date of Service: 05/23/2017 8:45 AM Medical Record Number: [id number] Patient Account Number:  [id number] Date of Birth/Sex: 04-May-1950 (67 y.o. Male) Treating RN: Ahmed Prima Primary Care Deshae Dickison: Myrtie Hawk Other Clinician: Referring Rosea Dory: Myrtie Hawk Treating Domenik Trice/Extender: Cathie Olden in Treatment: 34 Wound Status Wound Number: 5 Primary Diabetic Wound/Ulcer of the Lower Extremity Etiology: Wound Location: Left Toe Great Wound Open Wounding Event: Gradually Appeared Status: Date Acquired: 06/11/2016 Comorbid Anemia, Lymphedema, Congestive Heart Weeks Of Treatment: 34 History: Failure, Hypertension, Peripheral Venous Clustered Wound: No Disease, Type II Diabetes, Neuropathy Pending Amputation On Presentation Photos Photo Uploaded By: Alric Quan on 05/23/2017 13:26:19 Wound Measurements Length: (cm) 1.8 Width: (cm) 2.5 Depth: (cm) 0.1 Area: (cm) 3.534 Volume: (cm) 0.353 % Reduction in Area: 75.3% % Reduction in Volume: 75.3% Epithelialization: None Tunneling: No Undermining: No Wound Description Classification: Grade 2 Wound Margin: Flat and Intact Exudate Amount: Large Exudate Type: Serosanguineous Exudate Color: red, brown Foul Odor After Cleansing: Yes Due to Product Use: No Slough/Fibrino No Wound Bed Granulation Amount: Medium (34-66%) Exposed Structure Granulation Quality: Red Fascia Exposed: No Necrotic Amount: Small (1-33%) Fat Layer (Subcutaneous Tissue) Exposed: Yes Necrotic Quality: Adherent Slough Tendon Exposed: No Muscle Exposed: No Joint Exposed: No Bone Exposed: No Periwound Skin Texture Nickle, Stefon E. ([id number]) Texture Color No Abnormalities Noted: No No Abnormalities Noted: No Callus: No Atrophie Blanche: No Crepitus: No Cyanosis: No Excoriation: No Ecchymosis: No Induration: No Erythema: No Rash: No Hemosiderin Staining: No Scarring: No Mottled: No Pallor: No Moisture Rubor: No No Abnormalities Noted: No Dry / Scaly: No Temperature / Pain Maceration:  Yes Temperature: No Abnormality Wound Preparation Ulcer Cleansing: Rinsed/Irrigated with Saline Topical Anesthetic Applied: Other: lidocaine 4%, Treatment Notes Wound #5 (Left Toe Great) 1. Cleansed with: Clean wound with Normal Saline 2. Anesthetic Topical Lidocaine 4% cream to wound bed prior to debridement 4. Dressing Applied: Other dressing (specify in notes) 5. Secondary Dressing Applied ABD Pad Kerlix/Conform 7. Secured with Tape Notes silvercel Electronic Signature(s) Signed: 05/24/2017 4:55:35 PM By: Alric Quan Entered By: Alric Quan on 05/23/2017 09:05:25 Ishida, [name] ([id number]) -------------------------------------------------------------------------------- Wound Assessment Details Patient Name: Kurka, Tajay E. Date of Service: 05/23/2017 8:45 AM Medical Record Number: [id number] Patient Account Number: [id number] Date of Birth/Sex: 06-28-1950 (67 y.o. Male) Treating RN: Ahmed Prima Primary Care Mayetta Castleman: Delos Haring,  TRACY Other Clinician: Referring Raenette Sakata: Myrtie Hawk Treating Eward Rutigliano/Extender: Cathie Olden in Treatment: 34 Wound Status Wound Number: 6 Primary Diabetic Wound/Ulcer of the Lower Extremity Etiology: Wound Location: Left Toe Second Wound Open Wounding Event: Gradually Appeared Status: Date Acquired: 06/11/2016 Comorbid Anemia, Lymphedema, Congestive Heart Weeks Of Treatment: 34 History: Failure, Hypertension, Peripheral Venous Clustered Wound: No Disease, Type II Diabetes, Neuropathy Pending Amputation On Presentation Photos Photo Uploaded By: Alric Quan on 05/23/2017 13:26:19 Wound Measurements Length: (cm) 0.9 Width: (cm) 0.6 Depth: (cm) 0.1 Area: (cm) 0.424 Volume: (cm) 0.042 % Reduction in Area: 80.1% % Reduction in Volume: 80.4% Epithelialization: None Tunneling: No Undermining: No Wound Description Classification: Grade 2 Wound Margin: Flat and Intact Exudate Amount:  Large Exudate Type: Serosanguineous Exudate Color: red, brown Foul Odor After Cleansing: Yes Due to Product Use: No Slough/Fibrino No Wound Bed Granulation Amount: Large (67-100%) Exposed Structure Granulation Quality: Red Fascia Exposed: No Necrotic Amount: Small (1-33%) Fat Layer (Subcutaneous Tissue) Exposed: Yes Necrotic Quality: Adherent Slough Tendon Exposed: No Muscle Exposed: No Joint Exposed: No Bone Exposed: No Periwound Skin Texture Dials, Wilborn E. ([id number]) Texture Color No Abnormalities Noted: No No Abnormalities Noted: No Callus: No Atrophie Blanche: No Crepitus: No Cyanosis: No Excoriation: No Ecchymosis: No Induration: No Erythema: No Rash: No Hemosiderin Staining: No Scarring: No Mottled: No Pallor: No Moisture Rubor: No No Abnormalities Noted: No Dry / Scaly: No Temperature / Pain Maceration: Yes Temperature: No Abnormality Wound Preparation Ulcer Cleansing: Rinsed/Irrigated with Saline Topical Anesthetic Applied: Other: lidocaine 4%, Treatment Notes Wound #6 (Left Toe Second) 1. Cleansed with: Clean wound with Normal Saline 2. Anesthetic Topical Lidocaine 4% cream to wound bed prior to debridement 4. Dressing Applied: Other dressing (specify in notes) 5. Secondary Dressing Applied ABD Pad Kerlix/Conform 7. Secured with Tape Notes silvercel Electronic Signature(s) Signed: 05/24/2017 4:55:35 PM By: Alric Quan Entered By: Alric Quan on 05/23/2017 09:06:11 Cerasoli, [name] ([id number]) -------------------------------------------------------------------------------- Wound Assessment Details Patient Name: Janvrin, Shaheem E. Date of Service: 05/23/2017 8:45 AM Medical Record Number: [id number] Patient Account Number: [id number] Date of Birth/Sex: [birth year] (67 y.o. Male) Treating RN: Ahmed Prima Primary Care Kaya Pottenger: Myrtie Hawk Other Clinician: Referring Bronsyn Shappell: Myrtie Hawk Treating  Haldon Carley/Extender: Cathie Olden in Treatment: 34 Wound Status Wound Number: 9 Primary Diabetic Wound/Ulcer of the Lower Extremity Etiology: Wound Location: Right Toe Third Wound Open Wounding Event: Gradually Appeared Status: Date Acquired: 12/31/2016 Comorbid Anemia, Lymphedema, Congestive Heart Weeks Of Treatment: 20 History: Failure, Hypertension, Peripheral Venous Clustered Wound: No Disease, Type II Diabetes, Neuropathy Photos Photo Uploaded By: Alric Quan on 05/23/2017 13:26:43 Wound Measurements Length: (cm) 0.1 Width: (cm) 0.1 Depth: (cm) 0.1 Area: (cm) 0.008 Volume: (cm) 0.001 % Reduction in Area: 98% % Reduction in Volume: 97.4% Epithelialization: None Tunneling: No Undermining: No Wound Description Classification: Grade 2 Wound Margin: Flat and Intact Exudate Amount: Small Exudate Type: Serous Exudate Color: amber Foul Odor After Cleansing: Yes Due to Product Use: No Slough/Fibrino Yes Wound Bed Granulation Amount: Large (67-100%) Exposed Structure Granulation Quality: Red Fascia Exposed: No Necrotic Amount: Small (1-33%) Fat Layer (Subcutaneous Tissue) Exposed: Yes Necrotic Quality: Adherent Slough Tendon Exposed: No Muscle Exposed: No Joint Exposed: No Bone Exposed: No Periwound Skin Texture Yero, [name] E. ([id number]) Texture Color No Abnormalities Noted: No No Abnormalities Noted: No Callus: No Atrophie Blanche: No Crepitus: No Cyanosis: No Excoriation: No Ecchymosis: No Induration: No Erythema: No Rash: No Hemosiderin Staining: No Scarring: No Mottled: No Pallor: No Moisture Rubor: No No Abnormalities Noted: No Dry /  Scaly: No Temperature / Pain Maceration: No Temperature: No Abnormality Wound Preparation Ulcer Cleansing: Rinsed/Irrigated with Saline Topical Anesthetic Applied: Other: lidocaine 4%, Treatment Notes Wound #9 (Right Toe Third) 1. Cleansed with: Clean wound with Normal Saline 2.  Anesthetic Topical Lidocaine 4% cream to wound bed prior to debridement 4. Dressing Applied: Other dressing (specify in notes) 5. Secondary Dressing Applied ABD Pad Kerlix/Conform 7. Secured with Tape Notes silvercel Electronic Signature(s) Signed: 05/24/2017 4:55:35 PM By: Alric Quan Entered By: Alric Quan on 05/23/2017 09:06:52 Kiesel, [name] ([id number]) -------------------------------------------------------------------------------- [place] Details Patient Name: Bergerson, Djon E. Date of Service: 05/23/2017 8:45 AM Medical Record Number: [id number] Patient Account Number: [id number] Date of Birth/Sex: 10/19/1950 (67 y.o. Male) Treating RN: Ahmed Prima Primary Care Emilene Roma: Myrtie Hawk Other Clinician: Referring Lawerence Dery: Myrtie Hawk Treating Helmut Hennon/Extender: Cathie Olden in Treatment: 34 Vital Signs Time Taken: 08:54 Temperature (F): 98.2 Height (in): 69 Pulse (bpm): 76 Weight (lbs): 168 Respiratory Rate (breaths/min): 18 Body Mass Index (BMI): 24.8 Blood Pressure (mmHg): 165/83 Reference Range: 80 - 120 mg / dl Electronic Signature(s) Signed: 05/24/2017 4:55:35 PM By: Alric Quan Entered By: Alric Quan on 05/23/2017 08:54:11

## 2017-05-30 ENCOUNTER — Inpatient Hospital Stay: Payer: Medicare HMO

## 2017-05-30 ENCOUNTER — Other Ambulatory Visit
Admission: RE | Admit: 2017-05-30 | Discharge: 2017-05-30 | Disposition: A | Discharge status: Home / Self Care | Payer: Medicare HMO | Source: Ambulatory Visit | Attending: Nurse Practitioner | Admitting: Nurse Practitioner

## 2017-05-30 ENCOUNTER — Encounter: Payer: Medicare HMO | Admitting: Nurse Practitioner

## 2017-05-30 DIAGNOSIS — E119 Type 2 diabetes mellitus without complications: Secondary | ICD-10-CM | POA: Insufficient documentation

## 2017-05-30 DIAGNOSIS — D638 Anemia in other chronic diseases classified elsewhere: Secondary | ICD-10-CM

## 2017-05-30 DIAGNOSIS — D472 Monoclonal gammopathy: Secondary | ICD-10-CM | POA: Diagnosis not present

## 2017-05-30 DIAGNOSIS — E11621 Type 2 diabetes mellitus with foot ulcer: Secondary | ICD-10-CM | POA: Diagnosis not present

## 2017-05-30 LAB — HEMOGLOBIN A1C
HEMOGLOBIN A1C: 5 % (ref 4.8–5.6)
MEAN PLASMA GLUCOSE: 96.8 mg/dL

## 2017-05-30 LAB — HEMOGLOBIN: HEMOGLOBIN: 10.1 g/dL — AB (ref 13.0–18.0)

## 2017-06-01 NOTE — Progress Notes (Signed)
SRIANSH, FARRA ([id number]) Visit Report for 05/30/2017 Chief Complaint Document Details Patient Name: [name], [name] Date of Service: 05/30/2017 9:15 AM Medical Record Number: [id number] Patient Account Number: [id number] Date of Birth/Sex: 09-15-1950 (67 y.o. Male) Treating RN: Ahmed Prima Primary Care Provider: Myrtie Hawk Other Clinician: Referring Provider: Myrtie Hawk Treating Provider/Extender: Cathie Olden in Treatment: 84 Information Obtained from: Patient Chief Complaint Patient presents for treatment of an open diabetic ulcer to both feet Electronic Signature(s) Signed: 05/30/2017 10:44:07 AM By: Lawanda Cousins Entered By: Lawanda Cousins on 05/30/2017 10:44:06 [name], [name] ([id number]) -------------------------------------------------------------------------------- HPI Details Patient Name: [name], [name] Date of Service: 05/30/2017 9:15 AM Medical Record Number: [id number] Patient Account Number: [id number] Date of Birth/Sex: April 30, 1950 (67 y.o. Male) Treating RN: Ahmed Prima Primary Care Provider: Myrtie Hawk Other Clinician: Referring Provider: Myrtie Hawk Treating Provider/Extender: Cathie Olden in Treatment: 35 History of Present Illness Location: bilateral feet ulceration on the toes Quality: Patient reports experiencing a dull pain to affected area(s). Severity: Patient states wound are getting better Duration: Patient has had the wound for > 3 months prior to seeking treatment at the wound center Timing: Pain in wound is constant (hurts all the time) Context: The wound would happen gradually Modifying Factors: Other treatment(s) tried include:treatment for lymphedema and is seen by the podiatrist Dr. Caryl Comes Associated Signs and Symptoms: Patient reports having increase swelling. HPI Description: 67 year old patient here to see as for bilateral feet ulceration to on his left first and second toe and 2  on his right first and second toe, which she's had for about 4 months. 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 Donella Stade. The patient is also undergoing workup by medical oncology for a intra-abdominal lymphadenopathy. 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 He is also status post appendectomy, inguinal lymph node biopsy, rectal biopsy and rectal examination under anesthesia. he currently smokes cigarettes about half packet a day. In March of this year he was seen by Dr. Hortencia Pilar, for evaluation of bilateral varicose veins and besides wearing compression stockings he had recommended laser ablation of the right and left great saphenous veins to eleviate the symptoms and complications of severe superficial venous reflux disease. He also recommended lymphedema pumps for better control of his lymphedema. The patient recently has had on 08/23/2016, right greater saphenous vein ablation with the laser energy Earlier lower extremity venous reflux examination done on 05/08/2016 showed no DVT or SVT both lower legs but incompetence of bilateral great saphenous veins was present. A lower arterial study was also done and there was no significant right lower and left lower extremity problems based on a normal toe brachial index bilaterally and the ABI was 1.21 the left and 1.23 on the right. His post ablation venous duplex examination showed successful ablation of the right GS vein with thrombus formation 2 below the right saphenofemoral junction. The deep system was patent without evidence of thrombosis and this was done on 08/30/2016. the patient also has a squamous cell cancer of the skin of the buttock and is recently undergone radiation therapy  for this prior to excisional surgery. Addendum: regarding his x-rays done today and x-ray of the left foot -- IMPRESSION: No objective evidence of osteomyelitis. There are soft tissue changes which  may reflect cellulitis. X-ray of the right foot -- IMPRESSION:Findings compatible with cellulitis of the toes. No objective evidence of osteomyelitis is observed. 10/01/16 on evaluation today patient's wounds appeared to be doing some better. I did review the x-rays as well which showed no evidence of osteomyelitis although there was evidence on x-ray of cellulitis. He fortunately is not having any discomfort although he continues to have some swelling. He does not remember being on any antibiotics recently. 10/15/16 on evaluation today patient's wounds overall appear to be doing better although he does have a new location noted on the left foot. Fortunately he is not having significant pain. It almost has the appearance that something is rubbing on the end of his toes but he wears the open toe shoes and according to what he is telling me never wears anything that would rub on his foot. There is no evidence of infection and specifically no evidence of a fungal infection 10/22/16 On evaluation today patient's wounds appeared to be doing better compared to last week in regard to his bilateral [name], [name] ([id number]) lower extremities. Fortunately I happy with how things are progressing although he still has ulcers I feel like that he is improving and appropriate manner. 11/12/16 on evaluation today patient appears to be doing well in regard to his bilateral feet and the respective wounds. We have been using surrounding her dressings along with an antifungal cream which seems to be doing very well. He has no bilateral dysfunction noticed that the rituals are weight loss at this point. He also has no nausea or vomiting a note purulent discharge. He did see Vein and vascular today and he tells me that they  told him he could have surgery for his venous stasis but they did not feel like it was worth it in his words. Fortunately patient's wounds do appear to be getting sneakily better. 11/26/2016 -- he says he is going to have some surgery during this week at [facility] for possibly a colon resection. 12/31/2016 -- the patient has been noncompliant with his smoking and I'm not sure whether he is also started drinking again. He continues to be very nonchalant about his care 01/14/2017 -- the patient's HandP has been reviewed well and I understand he is being compliant with trying to give up smoking and his local dressing changes. He does not have any surgical options of 4 to him by his vascular surgeons.he was last seen in early August by Dr. Hortencia Pilar who recommended compression stockings,and possibly lymph pumps in 2-3 months after doing a review ultrasound. 01/28/2017 - the patient did not have any fresh complaints but on examination I noted a large lacerated wound on the plantar aspect of his right fourth toe which had a lot of necrotic debris and it probes down to bone. 02/07/2017 -- x-ray of the right foot -- IMPRESSION: Soft tissue swelling about the first through fourth toes consistent with cellulitis. New destructive change in the tuft of the distal phalanx of the great toe is consistent with osteomyelitis. 02/14/2017 -- the patient's MRI is pending this coming Monday and he still continues to smoke. We have again gone over off loading of his wounds in great detail and he says he's been compliant. 02/21/2017 -- MR of the right foot -- IMPRESSION: 1. Soft tissue ulcer at the tip of the first, second and third toe knows. Cortical irregularity and bone marrow edema in the first distal phalanx most concerning for osteomyelitis. Mild marrow edema  in the second and third distal phalanx without definite cortical destruction which may reflect early osteomyelitis versus reactive marrow  edema. 2. Soft tissue edema surrounding the first phalanx most consistent with cellulitis. the patient was also recently evaluated by his medical oncologist Dr. 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 She is treating him with weekly Procrit. She is also keeping intra-abdominal lymphadenopathy and right lower lobe lung nodule under observation. 04/04/2017 -- he was seen by Dr. Adrian Prows on 03/25/2017 -- after review he empirically put him on ciprofloxacin and doxycycline as they have good bone penetration and good bioavailability and it will cover the usual pathogens and diabetic foot osteomyelitis. He will check inflammatory markers and plan a 70-41 week old records. C-reactive protein was 0.3 and the ESR was 72 04/18/17 on evaluation today patient appears to be doing about the same in regard to his lower extremity wounds bilaterally. He has continued to use the antifungal cream which does seem to be beneficial. Nonetheless the ulcers do seem to in some areas be epithelial eyes over and in other areas are still open. He is having no significant discomfort. 04/25/17-he is here in follow-up evaluation for multiple ulcerations to multiple toes bilaterally. He states he did see Dr. Ola Spurr again last week and continues antibiotic therapy. He is voicing no complaints or concerns, will continue with current treatment plan will possibility of adding compression therapy next week after an additional week of treatment/lotions to BLE prescribed by Dr Ola Spurr 05/02/17 he is here in follow up for for multiple ulcers to multiple toes bilaterally. we will stop using antifungal cream and will continue with silvercel and follow up next week 05/09/17-he is here in follow-up for multiple ulcerations to multiple toes bilaterally. There is improvement in appearance. He has not completely stopped using antifungal cream, but admits he has  not using it between the toes. He has an appointment with Dr. Ola Spurr on 2/11, continues on doxycycline and Cipro. It has been 5 weeks of antibiotic therapy, we will order plain film xray to evaluate for osteomyelitis next week, prior to follow up with ID. Will continue with silvercel and follow up next week 05/16/17-he is here in follow-up evaluation for multiple ulcerations to multiple toes bilaterally and new wound to the right posterior heel. There is essentially no change in appearance, deteriorating measurements; he has a history of waxing and waning measurements. He admits that he continues to apply moisturizer/cream/ointment to his toes despite weekly reminders to only apply silvercel to his toes. He states that he thinks the surgical shoe contributed to the superficial ulcer to his posterior [name], [name] ([id number]) heel, he is unable to articulate if this was an area of dry cracked skin as he has a similar area to the left heel. He now is wearing open toed slippers. He has an appointment with Dr. Ola Spurr on 2/11. We have ordered x-rays for her bilateral feet; he was advised to obtain the x-rays today or tomorrow. He will follow-up next week 05/23/17-he is here in follow-up evaluation for multiple ulcerations to multiple toes bilaterally and the right posterior heel. There is improvement in maceration. He has been compliant and not applying any moisturizing agent to his toes. He has been using Lac-Hydrin for his lower extremities with improvement. He did not go to his appointment on Monday with Dr. Ola Spurr secondary to financial concerns. X-rays for her bilateral feet showed: LEFT FOOT with slight  erosion of the tuft of the distal phalanges of the left first and second toe suspicious for osteomyelitis, RIGHT FOOT with 1.erosion of the tufts of the distal phalanges of the right first second and possibly third toes consistent with osteomyelitis, 2 no definitive abnormality of  the calcaneus is seen on the images obtained, 3. Plantar calcaneal degenerative spur. We briefly discussed hyperbaric adjunctive therapy for treatment of chronic refractory osteomyelitis. I do not find an a1c in EMR, will contact PCP for record, or order if needed. He has been encouraged to contact Dr Ola Spurr office regarding the follow-up appointment, encouraged him to inquire about payment plan. We will continue with same treatment plan and follow-up next week. He states he is still taking antibiotics and has "a lot" left. He states he has been taking them as directed, 2 pills twice daily. According to Dr. Blane Ohara office notes he was originally started on 12/17 for 4 weeks and extended on 1/14 for an additional 4 weeks. He should be done with his antibiotic therapy, he was advised to bring his bottles and to his next appointment, we will contact pharmacy. 05/30/17-he is here in follow-up evaluation for multiple ulcerations to multiple toes bilaterally and the right posterior heel. He is accompanied by his brother-in-law. Wounds are stable. He has yet to make up with Dr. Ola Spurr. We contacted his PCP, with no record of recent A1c we will draw an A1c. His brother-in-law states that he was taken off all of his diabetic medication secondary to kidney function. 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 The antibiotics were counted and have approximately 2 weeks left, although they should be complete. The pill organizer reveals missing days. We discussed the need for consistent medications, to have optimal benefit of medication. He has a cousin that lives with him and he will ask her to check his organizer daily. He has been advised to follow up with Dr Ola Spurr, and will go by the office today. He has been advised to quit smoking. Electronic Signature(s) Signed: 05/30/2017 10:57:55 AM By:  Lawanda Cousins Previous Signature: 05/30/2017 10:50:47 AM Version By: Lawanda Cousins Entered By: Lawanda Cousins on 05/30/2017 10:57:54 Leis, [name] ([id number]) -------------------------------------------------------------------------------- Physician Orders Details Patient Name: Sybert, Kollyn E. Date of Service: 05/30/2017 9:15 AM Medical Record Number: [id number] Patient Account Number: [id number] Date of Birth/Sex: 1950/10/24 (67 y.o. Male) Treating RN: Ahmed Prima Primary Care Provider: Myrtie Hawk Other Clinician: Referring Provider: Myrtie Hawk Treating Provider/Extender: Cathie Olden in Treatment: 28 Verbal / Phone Orders: Yes Clinician: Carolyne Fiscal, Debi Read Back and Verified: Yes Diagnosis Coding Wound Cleansing Wound #1 Right Toe Great o Clean wound with Normal Saline. o May Shower, gently pat wound dry prior to applying new dressing. Wound #11 Right Calcaneus o Clean wound with Normal Saline. o May Shower, gently pat wound dry prior to applying new dressing. Wound #2 Right Toe Second o Clean wound with Normal Saline. o May Shower, gently pat wound dry prior to applying new dressing. Wound #5 Left Toe Great o Clean wound with Normal Saline. o May Shower, gently pat wound dry prior to applying new dressing. Wound #6 Left Toe Second o Clean wound with Normal Saline. o May Shower, gently pat wound dry prior to applying new dressing. Wound #9 Right Toe Third o Clean wound with Normal Saline. o May Shower, gently pat wound dry prior to applying new dressing. Anesthetic (add to Medication List)  Wound #1 Right Toe Great o Topical Lidocaine 4% cream applied to wound bed prior to debridement (In Clinic Only). Wound #11 Right Calcaneus o Topical Lidocaine 4% cream applied to wound bed prior to debridement (In Clinic Only). Wound #2 Right Toe Second o Topical Lidocaine 4% cream applied to wound bed prior to debridement  (In Clinic Only). Wound #5 Left Toe Great o Topical Lidocaine 4% cream applied to wound bed prior to debridement (In Clinic Only). Wound #6 Left Toe Second o Topical Lidocaine 4% cream applied to wound bed prior to debridement (In Clinic Only). Wound #9 Right Toe Third o Topical Lidocaine 4% cream applied to wound bed prior to debridement (In Clinic Only). Primary Wound Dressing Kisiel, [name] ([id number]) Wound #1 Right Toe Great o Silvercel Non-Adherent Wound #11 Right Calcaneus o Silvercel Non-Adherent Wound #2 Right Toe Second o Silvercel Non-Adherent Wound #5 Left Toe Great o Silvercel Non-Adherent Wound #6 Left Toe Second o Silvercel Non-Adherent Wound #9 Right Toe Third o Silvercel Non-Adherent Secondary Dressing Wound #1 Right Toe Great o ABD and Kerlix/Conform Wound #11 Right Calcaneus o Conform/Kerlix o Other - allevyn heel cup Wound #2 Right Toe Second o ABD and Kerlix/Conform Wound #5 Left Toe Great o ABD and Kerlix/Conform Wound #6 Left Toe Second o ABD and Kerlix/Conform Wound #9 Right Toe Third o ABD and Kerlix/Conform Dressing Change Frequency Wound #1 Right Toe Great o Change dressing every day. Wound #11 Right Calcaneus o Change dressing every day. Wound #2 Right Toe Second o Change dressing every day. Wound #5 Left Toe Great o Change dressing every day. Wound #6 Left Toe Second o Change dressing every day. Wound #9 Right Toe Third o Change dressing every day. [name], [name] ([id number]) Follow-up Appointments Wound #1 Right Toe Great o Return Appointment in 1 week. Wound #11 Right Calcaneus o Return Appointment in 1 week. Wound #2 Right Toe Second o Return Appointment in 1 week. Wound #5 Left Toe Great o Return Appointment in 1 week. Wound #6 Left Toe Second o Return Appointment in 1 week. Wound #9 Right Toe Third o Return Appointment in 1 week. Edema Control o 3 Layer  Compression System - Bilateral - unna to anchor Additional Orders / Instructions Wound #1 Right Toe Great o Stop Smoking o Increase protein intake. o Other: - Please add vitamin A, vitamin C and zinc supplements to your diet Wound #11 Right Calcaneus o Stop Smoking o Increase protein intake. o Other: - Please add vitamin A, vitamin C and zinc supplements to your diet Wound #2 Right Toe Second o Stop Smoking o Increase protein intake. o Other: - Please add vitamin A, vitamin C and zinc supplements to your diet Wound #5 Left Toe Great o Stop Smoking o Increase protein intake. o Other: - Please add vitamin A, vitamin C and zinc supplements to your diet Wound #6 Left Toe Second o Stop Smoking o Increase protein intake. o Other: - Please add vitamin A, vitamin C and zinc supplements to your diet Wound #9 Right Toe Third o Stop Smoking o Increase protein intake. o Other: - Please add vitamin A, vitamin C and zinc supplements to your diet Laboratory o Hemoglobin A1c (glycated HgB)/Hemoglobin.total in Blood (CHEM) Goncalves, Bence E. ([id number]) oooo LOINC Code: [phone] oooo Convenience Name: Hb A1c -Method not specified Patient Medications Allergies: No Known Drug Allergies Notifications Medication Indication Start End lidocaine DOSE 1 - topical 4 % cream - 1 cream topical Electronic Signature(s) Signed: 05/30/2017 5:45:09 PM  By: Lawanda Cousins Entered By: Lawanda Cousins on 05/30/2017 10:56:20 Jaycox, [name] ([id number]) -------------------------------------------------------------------------------- Prescription 05/30/2017 Patient Name: [name] Provider: Lawanda Cousins NP Date of Birth: 11-Sep-1950 NPI#: [id number] Sex: Jerilynn Mages DEA#: [id number] Phone #: [phone] License #: Patient Address: [facility] [place], [place] [address] [address] [place],  [address] [phone] Allergies No Known Drug Allergies Medication Medication: Route: Strength: Form: lidocaine 4 % topical cream topical 4% cream Class: TOPICAL LOCAL ANESTHETICS Dose: Frequency / Time: Indication: 1 1 cream topical Number of Refills: Number of Units: 0 Generic Substitution: Start Date: End Date: One Time Use: Substitution Permitted No Note to Pharmacy: Signature(s): Date(s): Electronic Signature(s) Signed: 05/30/2017 5:45:09 PM By: Lawanda Cousins Entered By: Lawanda Cousins on 05/30/2017 10:56:22 [name], [name] ([id number]) --------------------------------------------------------------------------------  Problem List Details Patient Name: Lumpkin, Johnpaul E. Date of Service: 05/30/2017 9:15 AM Medical Record Number: [id number] Patient Account Number: [id number] Date of Birth/Sex: 1951/02/01 (67 y.o. Male) Treating RN: Ahmed Prima Primary Care Provider: Myrtie Hawk Other Clinician: Referring Provider: Myrtie Hawk Treating Provider/Extender: Cathie Olden in Treatment: 35 Active Problems ICD-10 Encounter Code Description Active Date Diagnosis E11.621 Type 2 diabetes mellitus with foot ulcer 09/21/2016 Yes I87.313 Chronic venous hypertension (idiopathic) with ulcer of bilateral 09/21/2016 Yes lower extremity I89.0 Lymphedema, not elsewhere classified 09/21/2016 Yes L97.522 Non-pressure chronic ulcer of other part of left foot with fat layer 09/21/2016 Yes exposed L97.512 Non-pressure chronic ulcer of other part of right foot with fat layer 09/21/2016 Yes exposed F17.218 Nicotine dependence, cigarettes, with other nicotine-induced 09/21/2016 Yes disorders F10.19 Alcohol abuse with unspecified alcohol-induced disorder 09/21/2016 Yes M86.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, right ankle and foot 02/21/2017 Yes Inactive Problems Resolved Problems Electronic Signature(s) Signed: 05/30/2017 10:42:56 AM By: Lawanda Cousins Entered By: Lawanda Cousins on 05/30/2017 10:42:56 [name], [name] ([id number]) -------------------------------------------------------------------------------- Progress Note Details Patient Name: Brandt, Linda E. Date of Service: 05/30/2017 9:15 AM Medical Record Number: [id number] Patient Account Number: [id number] Date of Birth/Sex: [birth year] (67 y.o. Male) Treating RN: Ahmed Prima Primary Care Provider: Myrtie Hawk Other Clinician: Referring Provider: Myrtie Hawk Treating Provider/Extender: Cathie Olden in Treatment: 35 Subjective Chief Complaint Information obtained from Patient Patient presents for treatment of an open diabetic ulcer to both feet History of Present Illness (HPI) The following HPI elements were documented for the patient's wound: Location: bilateral feet ulceration on the toes Quality: Patient reports experiencing a dull pain to affected area(s). Severity: Patient states wound are getting better Duration: Patient has had the wound for > 3 months prior to seeking treatment at the wound center Timing: Pain in wound is constant (hurts all the time) Context: The wound would happen gradually Modifying Factors: Other treatment(s) tried include:treatment for lymphedema and is seen by the podiatrist Dr. Caryl Comes Associated Signs and Symptoms: Patient reports having increase swelling. 67 year old patient here to see as for bilateral feet ulceration to on his left first and second toe and 2 on his right first and second toe, which she's had for about 4 months. 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 Donella Stade. The patient is also undergoing workup by medical oncology for a intra-abdominal lymphadenopathy. 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 He is  also status post appendectomy, inguinal lymph node biopsy, rectal biopsy and rectal examination under anesthesia. he currently smokes cigarettes about half packet a day. In March of this year he was seen by Dr. Hortencia Pilar, for evaluation of bilateral varicose veins and besides wearing compression stockings he had recommended laser ablation of the right and left great saphenous veins to eleviate the symptoms and complications of severe superficial venous reflux disease. He also recommended lymphedema pumps for better control of his lymphedema. The patient recently has had on 08/23/2016, right greater saphenous vein ablation with the laser energy Earlier lower extremity venous reflux examination done on 05/08/2016 showed no DVT or SVT both lower legs but incompetence of bilateral great saphenous veins was present. A lower arterial study was also done and there was no significant right lower and left lower extremity problems based on a normal toe brachial index bilaterally and the ABI was 1.21 the left and 1.23 on the right. His post ablation venous duplex examination showed successful ablation of the right GS vein with thrombus formation 2 below the right saphenofemoral junction. The deep system was patent without evidence of thrombosis and this was done on 08/30/2016. 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 Addendum: regarding his x-rays done today and x-ray of the left foot -- IMPRESSION: No objective evidence of osteomyelitis. There are soft tissue changes which may reflect cellulitis. X-ray of the right foot -- IMPRESSION:Findings compatible with cellulitis of the toes. No objective evidence of osteomyelitis is observed. 10/01/16 on evaluation today patient's wounds appeared to be doing some better. I did review the x-rays  as well which showed no evidence of osteomyelitis although there was evidence on x-ray of cellulitis. He fortunately is not having any discomfort [name], [name] ([id number]) although he continues to have some swelling. He does not remember being on any antibiotics recently. 10/15/16 on evaluation today patient's wounds overall appear to be doing better although he does have a new location noted on the left foot. Fortunately he is not having significant pain. It almost has the appearance that something is rubbing on the end of his toes but he wears the open toe shoes and according to what he is telling me never wears anything that would rub on his foot. There is no evidence of infection and specifically no evidence of a fungal infection 10/22/16 On evaluation today patient's wounds appeared to be doing better compared to last week in regard to his bilateral lower extremities. Fortunately I happy with how things are progressing although he still has ulcers I feel like that he is improving and appropriate manner. 11/12/16 on evaluation today patient appears to be doing well in regard to his bilateral feet and the respective wounds. We have been using surrounding her dressings along with an antifungal cream which seems to be doing very well. He has no bilateral dysfunction noticed that the rituals are weight loss at this point. He also has no nausea or vomiting a note purulent discharge. He did see Vein and vascular today and he tells me that they told him he could have surgery for his venous stasis but they did not feel like it was worth it in his words. Fortunately patient's wounds do appear to be getting sneakily better. 11/26/2016 -- he says he is going to have some surgery during this week at [facility] for possibly a colon resection. 12/31/2016 --  the patient has been noncompliant with his smoking and I'm not sure whether he is also started drinking again. He continues to be very nonchalant about  his care 01/14/2017 -- the patient's HandP has been reviewed well and I understand he is being compliant with trying to give up smoking and his local dressing changes. He does not have any surgical options of 4 to him by his vascular surgeons.he was last seen in early August by Dr. Hortencia Pilar who recommended compression stockings,and possibly lymph pumps in 2-3 months after doing a review ultrasound. 01/28/2017 - the patient did not have any fresh complaints but on examination I noted a large lacerated wound on the plantar aspect of his right fourth toe which had a lot of necrotic debris and it probes down to bone. 02/07/2017 -- x-ray of the right foot -- IMPRESSION: Soft tissue swelling about the first through fourth toes consistent with cellulitis. New destructive change in the tuft of the distal phalanx of the great toe is consistent with osteomyelitis. 02/14/2017 -- the patient's MRI is pending this coming Monday and he still continues to smoke. We have again gone over off loading of his wounds in great detail and he says he's been compliant. 02/21/2017 -- MR of the right foot -- IMPRESSION: 1. Soft tissue ulcer at the tip of the first, second and third toe knows. Cortical irregularity and bone marrow edema in the first distal phalanx most concerning for osteomyelitis. Mild marrow edema in the second and third distal phalanx without definite cortical destruction which may reflect early osteomyelitis versus reactive marrow edema. 2. Soft tissue edema surrounding the first phalanx most consistent with cellulitis. the patient was also recently evaluated by his medical oncologist Dr. 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 She is treating him with weekly Procrit. She is also keeping intra-abdominal lymphadenopathy and right lower lobe lung nodule under observation. 04/04/2017 -- he was seen by Dr. Adrian Prows on  03/25/2017 -- after review he empirically put him on ciprofloxacin and doxycycline as they have good bone penetration and good bioavailability and it will cover the usual pathogens and diabetic foot osteomyelitis. He will check inflammatory markers and plan a 43-30 week old records. C-reactive protein was 0.3 and the ESR was 72 04/18/17 on evaluation today patient appears to be doing about the same in regard to his lower extremity wounds bilaterally. He has continued to use the antifungal cream which does seem to be beneficial. Nonetheless the ulcers do seem to in some areas be epithelial eyes over and in other areas are still open. He is having no significant discomfort. 04/25/17-he is here in follow-up evaluation for multiple ulcerations to multiple toes bilaterally. He states he did see Dr. Ola Spurr again last week and continues antibiotic therapy. He is voicing no complaints or concerns, will continue with current treatment plan will possibility of adding compression therapy next week after an additional week of treatment/lotions to BLE prescribed by Dr Ola Spurr 05/02/17 he is here in follow up for for multiple ulcers to multiple toes bilaterally. we will stop using antifungal cream and will continue with silvercel and follow up next week [name], [name] ([id number]) 05/09/17-he is here in follow-up for multiple ulcerations to multiple toes bilaterally. There is improvement in appearance. He has not completely stopped using antifungal cream, but admits he has not using it between the toes. He has an appointment with Dr. Ola Spurr on 2/11, continues  on doxycycline and Cipro. It has been 5 weeks of antibiotic therapy, we will order plain film xray to evaluate for osteomyelitis next week, prior to follow up with ID. Will continue with silvercel and follow up next week 05/16/17-he is here in follow-up evaluation for multiple ulcerations to multiple toes bilaterally and new wound to the right posterior  heel. There is essentially no change in appearance, deteriorating measurements; he has a history of waxing and waning measurements. He admits that he continues to apply moisturizer/cream/ointment to his toes despite weekly reminders to only apply silvercel to his toes. He states that he thinks the surgical shoe contributed to the superficial ulcer to his posterior heel, he is unable to articulate if this was an area of dry cracked skin as he has a similar area to the left heel. He now is wearing open toed slippers. He has an appointment with Dr. Ola Spurr on 2/11. We have ordered x-rays for her bilateral feet; he was advised to obtain the x-rays today or tomorrow. He will follow-up next week 05/23/17-he is here in follow-up evaluation for multiple ulcerations to multiple toes bilaterally and the right posterior heel. There is improvement in maceration. He has been compliant and not applying any moisturizing agent to his toes. He has been using Lac-Hydrin for his lower extremities with improvement. He did not go to his appointment on Monday with Dr. Ola Spurr secondary to financial concerns. X-rays for her bilateral feet showed: LEFT FOOT with slight erosion of the tuft of the distal phalanges of the left first and second toe suspicious for osteomyelitis, RIGHT FOOT with 1.erosion of the tufts of the distal phalanges of the right first second and possibly third toes consistent with osteomyelitis, 2 no definitive abnormality of the calcaneus is seen on the images obtained, 3. Plantar calcaneal degenerative spur. We briefly discussed hyperbaric adjunctive therapy for treatment of chronic refractory osteomyelitis. I do not find an a1c in EMR, will contact PCP for record, or order if needed. He has been encouraged to contact Dr Ola Spurr office regarding the follow-up appointment, encouraged him to inquire about payment plan. We will continue with same treatment plan and follow-up next week. He states he  is still taking antibiotics and has "a lot" left. He states he has been taking them as directed, 2 pills twice daily. According to Dr. Blane Ohara office notes he was originally started on 12/17 for 4 weeks and extended on 1/14 for an additional 4 weeks. He should be done with his antibiotic therapy, he was advised to bring his bottles and to his next appointment, we will contact pharmacy. 05/30/17-he is here in follow-up evaluation for multiple ulcerations to multiple toes bilaterally and the right posterior heel. He is accompanied by his brother-in-law. Wounds are stable. He has yet to make up with Dr. Ola Spurr. We contacted his PCP, with no record of recent A1c we will draw an A1c. His brother-in-law states that he was taken off all of his diabetic medication secondary to kidney function. 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 The antibiotics were counted and have approximately 2 weeks left, although they should be complete. The pill organizer reveals missing days. We discussed the need for consistent medications, to have optimal benefit of medication. He has a cousin that lives with him and he will ask her to check his organizer daily. He has been advised to follow up with Dr Ola Spurr, and will go  by the office today. He has been advised to quit smoking. Patient History Information obtained from Patient. Social History Current every day smoker, Marital Status - Widowed, Alcohol Use - Daily - quit drinking about a week ago, Drug Use - No History, Caffeine Use - Moderate. Medical And Surgical History Notes Oncologic 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 Objective Constitutional Valletta, Shaunak E. ([id number]) Vitals Time Taken: 9:41 AM, Height: 69 in, Weight: 168 lbs, BMI: 24.8, Temperature: 98.3 F, Pulse: 82 bpm, Respiratory Rate: 18 breaths/min, Blood  Pressure: 162/87 mmHg. Integumentary (Hair, Skin) Wound #1 status is Open. Original cause of wound was Gradually Appeared. The wound is located on the Right Toe Great. The wound measures 2.5cm length x 4cm width x 0.1cm depth; 7.854cm^2 area and 0.785cm^3 volume. There is Fat Layer (Subcutaneous Tissue) Exposed exposed. There is no tunneling or undermining noted. There is a large amount of serosanguineous drainage noted. The wound margin is flat and intact. There is medium (34-66%) red granulation within the wound bed. There is a small (1-33%) amount of necrotic tissue within the wound bed including Eschar and Adherent Slough. The periwound skin appearance exhibited: Callus, Excoriation, Maceration. The periwound skin appearance did not exhibit: Crepitus, Induration, Rash, Scarring, Dry/Scaly, Atrophie Blanche, Cyanosis, Ecchymosis, Hemosiderin Staining, Mottled, Pallor, Rubor, Erythema. Periwound temperature was noted as No Abnormality. Wound #11 status is Open. Original cause of wound was Gradually Appeared. The wound is located on the Right Calcaneus. The wound measures 0.4cm length x 0.3cm width x 0.1cm depth; 0.094cm^2 area and 0.009cm^3 volume. There is no tunneling or undermining noted. There is a none present amount of drainage noted. The wound margin is distinct with the outline attached to the wound base. There is large (67-100%) red granulation within the wound bed. There is a small (1-33%) amount of necrotic tissue within the wound bed including Adherent Slough. The periwound skin appearance exhibited: Callus. The periwound skin appearance did not exhibit: Crepitus, Excoriation, Induration, Rash, Scarring, Dry/Scaly, Maceration, Atrophie Blanche, Cyanosis, Ecchymosis, Hemosiderin Staining, Mottled, Pallor, Rubor, Erythema. Periwound temperature was noted as No Abnormality. The periwound has tenderness on palpation. Wound #2 status is Open. Original cause of wound was Gradually Appeared.  The wound is located on the Right Toe Second. The wound measures 1cm length x 2cm width x 0.1cm depth; 1.571cm^2 area and 0.157cm^3 volume. There is Fat Layer (Subcutaneous Tissue) Exposed exposed. There is no tunneling or undermining noted. There is a large amount of serosanguineous drainage noted. The wound margin is flat and intact. There is medium (34-66%) red granulation within the wound bed. There is a medium (34-66%) amount of necrotic tissue within the wound bed including Eschar and Adherent Slough. The periwound skin appearance exhibited: Scarring, Maceration. The periwound skin appearance did not exhibit: Callus, Crepitus, Excoriation, Induration, Rash, Dry/Scaly, Atrophie Blanche, Cyanosis, Ecchymosis, Hemosiderin Staining, Mottled, Pallor, Rubor, Erythema. Periwound temperature was noted as No Abnormality. Wound #5 status is Open. Original cause of wound was Gradually Appeared. The wound is located on the Left Toe Great. The wound measures 2.4cm length x 3cm width x 0.1cm depth; 5.655cm^2 area and 0.565cm^3 volume. There is Fat Layer (Subcutaneous Tissue) Exposed exposed. There is no tunneling or undermining noted. There is a large amount of serosanguineous drainage noted. Foul odor after cleansing was noted. The wound margin is flat and intact. There is medium (34-66%) red granulation within the wound bed. There is a small (1-33%) amount of necrotic tissue within the wound bed including Adherent  Slough. The periwound skin appearance exhibited: Maceration. The periwound skin appearance did not exhibit: Callus, Crepitus, Excoriation, Induration, Rash, Scarring, Dry/Scaly, Atrophie Blanche, Cyanosis, Ecchymosis, Hemosiderin Staining, Mottled, Pallor, Rubor, Erythema. Periwound temperature was noted as No Abnormality. Wound #6 status is Open. Original cause of wound was Gradually Appeared. The wound is located on the Left Toe Second. The wound measures 1.5cm length x 1.5cm width x 0.1cm  depth; 1.767cm^2 area and 0.177cm^3 volume. There is Fat Layer (Subcutaneous Tissue) Exposed exposed. There is no tunneling or undermining noted. There is a large amount of serosanguineous drainage noted. The wound margin is flat and intact. There is large (67-100%) red granulation within the wound bed. There is a small (1-33%) amount of necrotic tissue within the wound bed including Adherent Slough. The periwound skin appearance exhibited: Maceration. The periwound skin appearance did not exhibit: Callus, Crepitus, Excoriation, Induration, Rash, Scarring, Dry/Scaly, Atrophie Blanche, Cyanosis, Ecchymosis, Hemosiderin Staining, Mottled, Pallor, Rubor, Erythema. Periwound temperature was noted as No Abnormality. Wound #9 status is Open. Original cause of wound was Gradually Appeared. The wound is located on the Right Toe Third. The wound measures 0.3cm length x 0.3cm width x 0.1cm depth; 0.071cm^2 area and 0.007cm^3 volume. There is Fat Layer (Subcutaneous Tissue) Exposed exposed. There is a small amount of serous drainage noted. The wound margin is flat and intact. There is large (67-100%) red granulation within the wound bed. There is a small (1-33%) amount of necrotic tissue within the wound bed including Adherent Slough. The periwound skin appearance did not exhibit: Callus, Crepitus, Excoriation, Induration, Rash, Scarring, Dry/Scaly, Maceration, Atrophie Blanche, Cyanosis, Ecchymosis, Hemosiderin Staining, Mottled, Pallor, Rubor, Erythema. Periwound temperature was noted as No Abnormality. BHAVIN, MONJARAZ ([id number]) Assessment Active Problems ICD-10 E11.621 - Type 2 diabetes mellitus with foot ulcer I87.313 - Chronic venous hypertension (idiopathic) with ulcer of bilateral lower extremity I89.0 - Lymphedema, not elsewhere classified L97.522 - Non-pressure chronic ulcer of other part of left foot with fat layer exposed L97.512 - Non-pressure chronic ulcer of other part of right foot with  fat layer exposed F17.218 - Nicotine dependence, cigarettes, with other nicotine-induced disorders F10.19 - Alcohol abuse 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 - Chronic multifocal osteomyelitis, right ankle and foot Plan Wound Cleansing: Wound #1 Right Toe Great: Clean wound with Normal Saline. May Shower, gently pat wound dry prior to applying new dressing. Wound #11 Right Calcaneus: Clean wound with Normal Saline. May Shower, gently pat wound dry prior to applying new dressing. Wound #2 Right Toe Second: Clean wound with Normal Saline. May Shower, gently pat wound dry prior to applying new dressing. Wound #5 Left Toe Great: Clean wound with Normal Saline. May Shower, gently pat wound dry prior to applying new dressing. Wound #6 Left Toe Second: Clean wound with Normal Saline. May Shower, gently pat wound dry prior to applying new dressing. Wound #9 Right Toe Third: Clean wound with Normal Saline. May Shower, gently pat wound dry prior to applying new dressing. Anesthetic (add to Medication List): Wound #1 Right Toe Great: Topical Lidocaine 4% cream applied to wound bed prior to debridement (In Clinic Only). Wound #11 Right Calcaneus: Topical Lidocaine 4% cream applied to wound bed prior to debridement (In Clinic Only). Wound #2 Right Toe Second: Topical Lidocaine 4% cream applied to wound bed prior to debridement (In Clinic Only). Wound #5 Left Toe Great: Topical Lidocaine 4% cream applied to wound bed prior to debridement (In Clinic Only). Wound #6 Left Toe Second: Topical Lidocaine 4% cream applied to  wound bed prior to debridement (In Clinic Only). Wound #9 Right Toe Third: Topical Lidocaine 4% cream applied to wound bed prior to debridement (In Clinic Only). Primary Wound Dressing: Wound #1 Right Toe Great: Silvercel Non-Adherent Steiner, Kemari E. ([id number]) Wound #11 Right Calcaneus: Silvercel Non-Adherent Wound #2 Right Toe Second: Silvercel  Non-Adherent Wound #5 Left Toe Great: Silvercel Non-Adherent Wound #6 Left Toe Second: Silvercel Non-Adherent Wound #9 Right Toe Third: Silvercel Non-Adherent Secondary Dressing: Wound #1 Right Toe Great: ABD and Kerlix/Conform Wound #11 Right Calcaneus: Conform/Kerlix Other - allevyn heel cup Wound #2 Right Toe Second: ABD and Kerlix/Conform Wound #5 Left Toe Great: ABD and Kerlix/Conform Wound #6 Left Toe Second: ABD and Kerlix/Conform Wound #9 Right Toe Third: ABD and Kerlix/Conform Dressing Change Frequency: Wound #1 Right Toe Great: Change dressing every day. Wound #11 Right Calcaneus: Change dressing every day. Wound #2 Right Toe Second: Change dressing every day. Wound #5 Left Toe Great: Change dressing every day. Wound #6 Left Toe Second: Change dressing every day. Wound #9 Right Toe Third: Change dressing every day. Follow-up Appointments: Wound #1 Right Toe Great: Return Appointment in 1 week. Wound #11 Right Calcaneus: Return Appointment in 1 week. Wound #2 Right Toe Second: Return Appointment in 1 week. Wound #5 Left Toe Great: Return Appointment in 1 week. Wound #6 Left Toe Second: Return Appointment in 1 week. Wound #9 Right Toe Third: Return Appointment in 1 week. Edema Control: 3 Layer Compression System - Bilateral - unna to anchor Additional Orders / Instructions: Wound #1 Right Toe Great: Stop Smoking Increase protein intake. Other: - Please add vitamin A, vitamin C and zinc supplements to your diet Wound #11 Right Calcaneus: Stop Smoking Increase protein intake. [name], [name] ([id number]) Other: - Please add vitamin A, vitamin C and zinc supplements to your diet Wound #2 Right Toe Second: Stop Smoking Increase protein intake. Other: - Please add vitamin A, vitamin C and zinc supplements to your diet Wound #5 Left Toe Great: Stop Smoking Increase protein intake. Other: - Please add vitamin A, vitamin C and zinc supplements to your  diet Wound #6 Left Toe Second: Stop Smoking Increase protein intake. Other: - Please add vitamin A, vitamin C and zinc supplements to your diet Wound #9 Right Toe Third: Stop Smoking Increase protein intake. Other: - Please add vitamin A, vitamin C and zinc supplements to your diet Laboratory ordered were: Hb A1c -Method not specified The following medication(s) was prescribed: lidocaine topical 4 % cream 1 1 cream topical was prescribed at facility 1. continue with silvercel 2. follow up with Dr Ola Spurr 3. profore lite ble 4. follow up next week Electronic Signature(s) Signed: 05/30/2017 10:58:07 AM By: Lawanda Cousins Previous Signature: 05/30/2017 10:57:10 AM Version By: Lawanda Cousins Entered By: Lawanda Cousins on 05/30/2017 10:58:07 [name], [name] ([id number]) -------------------------------------------------------------------------------- ROS/PFSH Details Patient Name: Montagna, [name] Date of Service: 05/30/2017 9:15 AM Medical Record Number: [id number] Patient Account Number: [id number] Date of Birth/Sex: 03/16/1951 (67 y.o. Male) Treating RN: Ahmed Prima Primary Care Provider: Myrtie Hawk Other Clinician: Referring Provider: Myrtie Hawk Treating Provider/Extender: Cathie Olden in Treatment: 35 Information Obtained From Patient Wound History Do you currently have one or more open woundso Yes How many open wounds do you currently haveo 6 Approximately how long have you had your woundso 3 months How have you been treating your wound(s) until nowo ointment and bandage Has your wound(s) ever healed and then re-openedo No Have you had any lab work done in the  past montho No Have you tested positive for an antibiotic resistant organism (MRSA, VRE)o No Have you tested positive for osteomyelitis (bone infection)o No Have you had any tests for circulation on your legso Yes Who ordered the testo PCP Where was the test doneo [facility] Medical  History: Negative for: Cataracts; Glaucoma; Optic Neuritis Ear/Nose/Mouth/Throat Medical History: Negative for: Chronic sinus problems/congestion; Middle ear problems Hematologic/Lymphatic Medical History: Positive for: Anemia; Lymphedema Negative for: Hemophilia; Human Immunodeficiency Virus; Sickle Cell Disease Respiratory Medical History: Negative for: Aspiration; Asthma; Chronic Obstructive Pulmonary Disease (COPD); Pneumothorax; Sleep Apnea; Tuberculosis Cardiovascular Medical History: Positive for: Congestive Heart Failure; Hypertension; Peripheral Venous Disease Negative for: Angina; Arrhythmia; Coronary Artery Disease; Deep Vein Thrombosis; Hypotension; Myocardial Infarction; Peripheral Arterial Disease; Phlebitis; Vasculitis Gastrointestinal Medical History: Negative for: Cirrhosis ; Colitis; Crohnos; Hepatitis A; Hepatitis B; Hepatitis C [name], [name] ([id number]) Endocrine Medical History: Positive for: Type II Diabetes Treated with: Oral agents Blood sugar tested every day: Yes Tested : QD Genitourinary Medical History: Negative for: End Stage Renal Disease Immunological Medical History: Negative for: Lupus Erythematosus; Raynaudos; Scleroderma Integumentary (Skin) Medical History: Negative for: History of Burn; History of pressure wounds Musculoskeletal Medical History: Negative for: Gout; Rheumatoid Arthritis; Osteoarthritis; Osteomyelitis Neurologic Medical History: Positive for: Neuropathy Negative for: Dementia; Quadriplegia; Paraplegia; Seizure Disorder Oncologic Medical History: Past Medical History Notes: 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 Immunizations Pneumococcal Vaccine: Received Pneumococcal Vaccination: No Immunization Notes: up to date Implantable Devices Family and Social History Current every day smoker; Marital Status - Widowed; Alcohol Use: Daily - quit drinking about a week ago; Drug Use: No History;  Caffeine Use: Moderate; Financial Concerns: No; Food, Clothing or Shelter Needs: No; Support System Lacking: No; Transportation Concerns: No; Advanced Directives: No; Patient does not want information on Advanced Directives Physician Affirmation I have reviewed and agree with the above information. Electronic Signature(s) Signed: 05/30/2017 5:45:09 PM By: Lawanda Cousins Signed: 05/31/2017 8:00:35 AM By: Elson Clan, [name] ([id number]) Entered By: Lawanda Cousins on 05/30/2017 10:56:01 [name], [name] ([id number]) -------------------------------------------------------------------------------- SuperBill Details Patient Name: Milosevic, Huntley E. Date of Service: 05/30/2017 Medical Record Number: [id number] Patient Account Number: [id number] Date of Birth/Sex: 04/25/1950 (67 y.o. 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 F17.218 Nicotine dependence, cigarettes, 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 abuse 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, right ankle and foot Facility Procedures CPT4 Code: [id number] Description: 89381 - WOUND CARE VISIT-LEV 5 EST PT Modifier: Quantity: 1 Physician Procedures CPT4 Code Description: [id number] - WC PHYS LEVEL 3 - EST PT ICD-10 Diagnosis Description E11.621 Type 2 diabetes mellitus with foot ulcer I87.313 Chronic venous hypertension  (idiopathic) with ulcer of bilateral L97.512 Non-pressure chronic ulcer of  other part of right foot with fat L97.522 Non-pressure chronic ulcer of other part of left foot with fat l Modifier: lower extremi layer exposed ayer exposed Quantity: 1 ty Electronic Signature(s) Signed: 05/30/2017 5:15:40 PM By: Alric Quan Signed: 05/30/2017 5:45:09 PM By: Lawanda Cousins Previous Signature: 05/30/2017 10:57:31 AM Version By: Lawanda Cousins Entered By:  Alric Quan on 05/30/2017 17:15:39

## 2017-06-02 NOTE — Progress Notes (Signed)
TERRIE, GRAJALES ([id number]) Visit Report for 05/30/2017 Arrival Information Details Patient Name: [name], [name]. Date of Service: 05/30/2017 9:15 AM Medical Record Number: [id number] Patient Account Number: [id number] Date of Birth/Sex: February 12, 1951 (67 y.o. Male) Treating RN: Roger Shelter Primary Care Branch Pacitti: Myrtie Hawk Other Clinician: Referring Allegra Cerniglia: Myrtie Hawk Treating Kardell Virgil/Extender: Cathie Olden in Treatment: 93 Visit Information History Since Last Visit All ordered tests and consults were completed: No Patient Arrived: Ambulatory Added or deleted any medications: No Arrival Time: 09:40 Any new allergies or adverse reactions: No Transfer Assistance: None Had a fall or experienced change in No Patient Identification Verified: Yes activities of daily living that may affect Secondary Verification Process Completed: Yes risk of falls: Patient Requires Transmission-Based No Signs or symptoms of abuse/neglect since last visito No Precautions: Hospitalized since last visit: No Patient Has Alerts: Yes Pain Present Now: No Patient Alerts: DMII Electronic Signature(s) Signed: 05/31/2017 4:58:29 PM By: Roger Shelter Entered By: Roger Shelter on 05/30/2017 09:41:16 [name] [name] ([id number]) -------------------------------------------------------------------------------- Clinic Level of Care Assessment Details Patient Name: [name] [name]. Date of Service: 05/30/2017 9:15 AM Medical Record Number: [id number] Patient Account Number: [id number] Date of Birth/Sex: [birth year] (67 y.o. Male) Treating RN: Ahmed Prima Primary Care Erlin Gardella: Myrtie Hawk Other Clinician: Referring Gopal Malter: Myrtie Hawk Treating Hannah Strader/Extender: Cathie Olden in Treatment: 35 Clinic Level of Care Assessment Items TOOL 4 Quantity Score X - Use when only an EandM is performed on FOLLOW-UP visit 1 0 ASSESSMENTS - Nursing  Assessment / Reassessment X - Reassessment of Co-morbidities (includes updates in patient status) 1 10 X- 1 5 Reassessment of Adherence to Treatment Plan ASSESSMENTS - Wound and Skin Assessment / Reassessment []  - Simple Wound Assessment / Reassessment - one wound 0 X- 6 5 Complex Wound Assessment / Reassessment - multiple wounds []  - 0 Dermatologic / Skin Assessment (not related to wound area) ASSESSMENTS - Focused Assessment []  - Circumferential Edema Measurements - multi extremities 0 []  - 0 Nutritional Assessment / Counseling / Intervention []  - 0 Lower Extremity Assessment (monofilament, tuning fork, pulses) []  - 0 Peripheral Arterial Disease Assessment (using hand held doppler) ASSESSMENTS - Ostomy and/or Continence Assessment and Care []  - Incontinence Assessment and Management 0 []  - 0 Ostomy Care Assessment and Management (repouching, etc.) PROCESS - Coordination of Care X - Simple Patient / Family Education for ongoing care 1 15 []  - 0 Complex (extensive) Patient / Family Education for ongoing care []  - 0 Staff obtains Programmer, systems, Records, Test Results / Process Orders []  - 0 Staff telephones HHA, Nursing Homes / Clarify orders / etc []  - 0 Routine Transfer to another Facility (non-emergent condition) []  - 0 Routine Hospital Admission (non-emergent condition) []  - 0 New Admissions / Biomedical engineer / Ordering NPWT, Apligraf, etc. []  - 0 Emergency Hospital Admission (emergent condition) X- 1 10 Simple Discharge Coordination [name] [name] E. ([id number]) []  - 0 Complex (extensive) Discharge Coordination PROCESS - Special Needs []  - Pediatric / Minor Patient Management 0 []  - 0 Isolation Patient Management []  - 0 Hearing / Language / Visual special needs []  - 0 Assessment of Community assistance (transportation, D/C planning, etc.) []  - 0 Additional assistance / Altered mentation []  - 0 Support Surface(s) Assessment (bed, cushion, seat,  etc.) INTERVENTIONS - Wound Cleansing / Measurement []  - Simple Wound Cleansing - one wound 0 X- 6 5 Complex Wound Cleansing - multiple wounds []  - 0 Wound Imaging (photographs - any number of wounds) X- 1 5 Wound Tracing (instead  of photographs) []  - 0 Simple Wound Measurement - one wound X- 6 5 Complex Wound Measurement - multiple wounds INTERVENTIONS - Wound Dressings []  - Small Wound Dressing one or multiple wounds 0 X- 2 15 Medium Wound Dressing one or multiple wounds []  - 0 Large Wound Dressing one or multiple wounds X- 1 5 Application of Medications - topical []  - 0 Application of Medications - injection INTERVENTIONS - Miscellaneous []  - External ear exam 0 []  - 0 Specimen Collection (cultures, biopsies, blood, body fluids, etc.) []  - 0 Specimen(s) / Culture(s) sent or taken to Lab for analysis []  - 0 Patient Transfer (multiple staff / Civil Service fast streamer / Similar devices) []  - 0 Simple Staple / Suture removal (25 or less) []  - 0 Complex Staple / Suture removal (26 or more) []  - 0 Hypo / Hyperglycemic Management (close monitor of Blood Glucose) []  - 0 Ankle / Brachial Index (ABI) - do not check if billed separately X- 1 5 Vital Signs [name] [name] E. ([id number]) Has the patient been seen at the hospital within the last three years: Yes Total Score: 175 Level Of Care: New/Established - Level 5 Electronic Signature(s) Signed: 05/31/2017 8:00:35 AM By: Alric Quan Entered By: Alric Quan on 05/30/2017 17:15:26 [name] [name] ([id number]) -------------------------------------------------------------------------------- Encounter Discharge Information Details Patient Name: [name], [name] Date of Service: 05/30/2017 9:15 AM Medical Record Number: [id number] Patient Account Number: [id number] Date of Birth/Sex: [birth year] (67 y.o. Male) Treating RN: Ahmed Prima Primary Care Deanna Wiater: Myrtie Hawk Other Clinician: Referring Corona Popovich:  Myrtie Hawk Treating Daniya Aramburo/Extender: Cathie Olden in Treatment: 12 Encounter Discharge Information Items Discharge Pain Level: 0 Discharge Condition: Stable Ambulatory Status: Ambulatory Discharge Destination: Home Transportation: Private Auto Accompanied By: brother n law Schedule Follow-up Appointment: Yes Medication Reconciliation completed and No provided to Patient/Care Vora Clover: Provided on Clinical Summary of Care: 05/30/2017 Form Type Recipient Paper Patient ED Electronic Signature(s) Signed: 05/31/2017 4:58:29 PM By: Roger Shelter Entered By: Roger Shelter on 05/30/2017 10:42:22 Mcmanigal, [name] [name] ([id number]) -------------------------------------------------------------------------------- Lower Extremity Assessment Details Patient Name: [name] [name] E. Date of Service: 05/30/2017 9:15 AM Medical Record Number: [id number] Patient Account Number: [id number] Date of Birth/Sex: [birth year] (67 y.o. Male) Treating RN: Roger Shelter Primary Care Charbel Los: Myrtie Hawk Other Clinician: Referring Evander Macaraeg: Myrtie Hawk Treating Shantaya Bluestone/Extender: Cathie Olden in Treatment: 35 Edema Assessment Assessed: [Left: No] [Right: No] Edema: [Left: Yes] [Right: Yes] Vascular Assessment Claudication: Claudication Assessment [Left:None] [Right:None] Pulses: Dorsalis Pedis Palpable: [Left:Yes] [Right:Yes] Posterior Tibial Extremity colors, hair growth, and conditions: Extremity Color: [Left:Hyperpigmented] [Right:Hyperpigmented] Hair Growth on Extremity: [Left:No] [Right:No] Temperature of Extremity: [Left:Warm] [Right:Warm] Capillary Refill: [Left:< 3 seconds] [Right:< 3 seconds] Toe Nail Assessment Left: Right: Deformed: Yes Yes Improper Length and Hygiene: Yes Yes Electronic Signature(s) Signed: 05/31/2017 4:58:29 PM By: Roger Shelter Entered By: Roger Shelter on 05/30/2017 09:58:35 Kapler, [name] [name]  ([id number]) -------------------------------------------------------------------------------- Multi Wound Chart Details Patient Name: [name] [name] E. Date of Service: 05/30/2017 9:15 AM Medical Record Number: [id number] Patient Account Number: [id number] Date of Birth/Sex: September 23, 1950 (67 y.o. Male) Treating RN: Ahmed Prima Primary Care Berel Najjar: Myrtie Hawk Other Clinician: Referring Angelica Wix: Myrtie Hawk Treating Adean Milosevic/Extender: Cathie Olden in Treatment: 35 Vital Signs Height(in): 69 Pulse(bpm): 82 Weight(lbs): 168 Blood Pressure(mmHg): 162/87 Body Mass Index(BMI): 25 Temperature(F): 98.3 Respiratory Rate 18 (breaths/min): Photos: Wound Location: Right Toe Great Right Calcaneus Right Toe Second Wounding Event: Gradually Appeared Gradually Appeared Gradually Appeared Primary Etiology: Diabetic Wound/Ulcer of 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 Lower  Extremity Lower Extremity Comorbid History: Anemia, Lymphedema, Anemia, Lymphedema, Anemia, Lymphedema, Congestive Heart Failure, Congestive Heart Failure, Congestive Heart Failure, Hypertension, Peripheral Hypertension, Peripheral Hypertension, Peripheral Venous Disease, Type II Venous Disease, Type II Venous Disease, Type II Diabetes, Neuropathy Diabetes, Neuropathy Diabetes, Neuropathy Date Acquired: 06/11/2016 05/09/2017 06/11/2016 Weeks of Treatment: 35 2 35 Wound Status: Open Open Open Pending Amputation on Yes No Yes Presentation: Measurements L x W x D 2.5x4x0.1 0.4x0.3x0.1 1x2x0.1 (cm) Area (cm) : 7.854 0.094 1.571 Volume (cm) : 0.785 0.009 0.157 % Reduction in Area: 83.30% 76.10% 48.80% % Reduction in Volume: 83.30% 76.90% 48.90% Classification: Grade 2 Grade 2 Grade 2 [name] [name] E. ([id number]) Exudate Amount: Large None Present Large Exudate Type: Serosanguineous N/A Serosanguineous Exudate Color: red, brown N/A red, brown Foul Odor After  Cleansing: No No No Odor Anticipated Due to N/A N/A N/A Product Use: Wound Margin: Flat and Intact Distinct, outline attached Flat and Intact Granulation Amount: Medium (34-66%) Large (67-100%) Medium (34-66%) Granulation Quality: Red Red Red Necrotic Amount: Small (1-33%) Small (1-33%) Medium (34-66%) Necrotic Tissue: Eschar, Adherent Slough Adherent [name], Adherent Slough Exposed Structures: Fat Layer (Subcutaneous Fascia: No Fat Layer (Subcutaneous Tissue) Exposed: Yes Fat Layer (Subcutaneous Tissue) Exposed: Yes Fascia: No Tissue) Exposed: No Fascia: No Tendon: No Tendon: No Tendon: No Muscle: No Muscle: No Muscle: No Joint: No Joint: No Joint: No Bone: No Bone: No Bone: No Epithelialization: Small (1-33%) Large (67-100%) Medium (34-66%) Periwound Skin Texture: Excoriation: Yes Callus: Yes Scarring: Yes Callus: Yes Excoriation: No Excoriation: No Induration: No Induration: No Induration: No Crepitus: No Crepitus: No Callus: No Rash: No Rash: No Crepitus: No Scarring: No Scarring: No Rash: No Periwound Skin Moisture: Maceration: Yes Maceration: No Maceration: Yes Dry/Scaly: No Dry/Scaly: No Dry/Scaly: No Periwound Skin Color: Atrophie Blanche: No Atrophie Blanche: No Atrophie Blanche: No Cyanosis: No Cyanosis: No Cyanosis: No Ecchymosis: No Ecchymosis: No Ecchymosis: No Erythema: No Erythema: No Erythema: No Hemosiderin Staining: No Hemosiderin Staining: No Hemosiderin Staining: No Mottled: No Mottled: No Mottled: No Pallor: No Pallor: No Pallor: No Rubor: No Rubor: No Rubor: No Temperature: No Abnormality No Abnormality No Abnormality Tenderness on Palpation: No Yes No Wound Preparation: Ulcer Cleansing: Ulcer Cleansing: Ulcer Cleansing: Rinsed/Irrigated with Saline Rinsed/Irrigated with Saline Rinsed/Irrigated with Saline Topical Anesthetic Applied: Topical Anesthetic Applied: Topical Anesthetic Applied: Other:  lidocaine 4% Other: lidocaine 4% Other: lidocaine 4% Wound Number: [id number]  Photos: Wound Location: Left Toe Great Left Toe Second Right Toe Third Wounding Event: Gradually Appeared Gradually Appeared Gradually Appeared Primary Etiology: Diabetic Wound/Ulcer of 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 Lower Extremity Lower Extremity Comorbid History: Anemia, Lymphedema, Anemia, Lymphedema, Anemia, Lymphedema, Congestive Heart Failure, Congestive Heart Failure, Congestive Heart Failure, [name] [name] E. ([id number]) Hypertension, Peripheral Hypertension, Peripheral Hypertension, Peripheral Venous Disease, Type II Venous Disease, Type II Venous Disease, Type II Diabetes, Neuropathy Diabetes, Neuropathy Diabetes, Neuropathy Date Acquired: 06/11/2016 06/11/2016 12/31/2016 Weeks of Treatment: 35 35 21 Wound Status: Open Open Open Pending Amputation on Yes Yes No Presentation: Measurements L x W x D 2.4x3x0.1 1.5x1.5x0.1 0.3x0.3x0.1 (cm) Area (cm) : 5.655 1.767 0.071 Volume (cm) : 0.565 0.177 0.007 % Reduction in Area: 60.40% 17.30% 81.90% % Reduction in Volume: 60.50% 17.30% 82.10% Classification: Grade 2 Grade 2 Grade 2 Exudate Amount: Large Large Small Exudate Type: Serosanguineous Serosanguineous Serous Exudate Color: red, brown red, brown amber Foul Odor After Cleansing: Yes No No Odor Anticipated Due to No N/A N/A Product Use: Wound Margin: Flat  and Intact Flat and Intact Flat and Intact Granulation Amount: Medium (34-66%) Large (67-100%) Large (67-100%) Granulation Quality: Red Red Red Necrotic Amount: Small (1-33%) Small (1-33%) Small (1-33%) Necrotic Tissue: Adherent [place] Exposed Structures: Fat Layer (Subcutaneous Fat Layer (Subcutaneous Fat Layer (Subcutaneous Tissue) Exposed: Yes Tissue) Exposed: Yes Tissue) Exposed: Yes Fascia: No Fascia: No Fascia: No Tendon: No Tendon: No Tendon: No Muscle:  No Muscle: No Muscle: No Joint: No Joint: No Joint: No Bone: No Bone: No Bone: No Epithelialization: None Small (1-33%) None Periwound Skin Texture: Excoriation: No Excoriation: No Excoriation: No Induration: No Induration: No Induration: No Callus: No Callus: No Callus: No Crepitus: No Crepitus: No Crepitus: No Rash: No Rash: No Rash: No Scarring: No Scarring: No Scarring: No Periwound Skin Moisture: Maceration: Yes Maceration: Yes Maceration: No Dry/Scaly: No Dry/Scaly: No Dry/Scaly: No Periwound Skin Color: Atrophie Blanche: No Atrophie Blanche: No Atrophie Blanche: No Cyanosis: No Cyanosis: No Cyanosis: No Ecchymosis: No Ecchymosis: No Ecchymosis: No Erythema: No Erythema: No Erythema: No Hemosiderin Staining: No Hemosiderin Staining: No Hemosiderin Staining: No Mottled: No Mottled: No Mottled: No Pallor: No Pallor: No Pallor: No Rubor: No Rubor: No Rubor: No Temperature: No Abnormality No Abnormality No Abnormality Tenderness on Palpation: No No No Wound Preparation: Ulcer Cleansing: Ulcer Cleansing: Ulcer Cleansing: Rinsed/Irrigated with Saline Rinsed/Irrigated with Saline Rinsed/Irrigated with Saline Topical Anesthetic Applied: Topical Anesthetic Applied: Topical Anesthetic Applied: Other: lidocaine 4% Other: lidocaine 4% Other: lidocaine 4% [name] [name] E. ([id number]) Treatment Notes Wound #1 (Right Toe Great) 1. Cleansed with: Clean wound with Normal Saline 2. Anesthetic Topical Lidocaine 4% cream to wound bed prior to debridement 4. Dressing Applied: Other dressing (specify in notes) 5. Secondary Dressing Applied ABD Pad 6. Footwear/Offloading device applied Other footwear/offloading device applied (specify in notes) 7. Secured with 3 Layer Compression System - Bilateral Notes silver cell on wounds on toes, cover with abd pad and secure with tape. Darko shoes bilateral for off loading Wound #11 (Right Calcaneus) 1.  Cleansed with: Clean wound with Normal Saline 2. Anesthetic Topical Lidocaine 4% cream to wound bed prior to debridement 7. Secured with 3 Layer Compression System - Bilateral Notes heel cup Wound #2 (Right Toe Second) 1. Cleansed with: Clean wound with Normal Saline 2. Anesthetic Topical Lidocaine 4% cream to wound bed prior to debridement 4. Dressing Applied: Other dressing (specify in notes) 5. Secondary Dressing Applied ABD Pad 6. Footwear/Offloading device applied Other footwear/offloading device applied (specify in notes) 7. Secured with 3 Layer Compression System - Bilateral Notes silver cell on wounds on toes, cover with abd pad and secure with tape. Darko shoes bilateral for off loading Wound #5 (Left Toe Great) 1. Cleansed with: Clean wound with Normal Saline 2. Anesthetic Topical Lidocaine 4% cream to wound bed prior to debridement 4. Dressing Applied: Other dressing (specify in notes) 5. Secondary Dressing Applied [name] [name] E. ([id number]) ABD Pad 6. Footwear/Offloading device applied Other footwear/offloading device applied (specify in notes) 7. Secured with 3 Layer Compression System - Bilateral Notes silver cell on wounds on toes, cover with abd pad and secure with tape. Darko shoes bilateral for off loading Wound #6 (Left Toe Second) 1. Cleansed with: Clean wound with Normal Saline 2. Anesthetic Topical Lidocaine 4% cream to wound bed prior to debridement 4. Dressing Applied: Other dressing (specify in notes) 5. Secondary Dressing Applied ABD Pad 6. Footwear/Offloading device applied Other footwear/offloading device applied (specify in notes) 7. Secured with 3 Layer Compression System - Bilateral Notes silver  cell on wounds on toes, cover with abd pad and secure with tape. Darko shoes bilateral for off loading Wound #9 (Right Toe Third) 1. Cleansed with: Clean wound with Normal Saline 2. Anesthetic Topical Lidocaine 4% cream to wound bed  prior to debridement 4. Dressing Applied: Other dressing (specify in notes) 5. Secondary Dressing Applied ABD Pad 6. Footwear/Offloading device applied Other footwear/offloading device applied (specify in notes) 7. Secured with 3 Layer Compression System - Bilateral Notes silver cell on wounds on toes, cover with abd pad and secure with tape. Darko shoes bilateral for off loading Electronic Signature(s) Signed: 05/30/2017 10:43:40 AM By: Lawanda Cousins Entered By: Lawanda Cousins on 05/30/2017 10:43:40 Tumblin, [name] [name] ([id number]) -------------------------------------------------------------------------------- [place] Details Patient Name: Napoli, Dearies E. Date of Service: 05/30/2017 9:15 AM Medical Record Number: [id number] Patient Account Number: [id number] Date of Birth/Sex: 29-Oct-1950 (67 y.o. Male) Treating RN: Ahmed Prima Primary Care Brycen Bean: Myrtie Hawk Other Clinician: Referring Chasitee Zenker: Myrtie Hawk Treating Sahib Pella/Extender: Cathie Olden in Treatment: 35 Active Inactive Electronic Signature(s) Signed: 05/31/2017 8:00:35 AM By: Alric Quan Entered By: Alric Quan on 05/30/2017 10:07:52 Dollar, [name] [name] ([id number]) -------------------------------------------------------------------------------- Pain Assessment Details Patient Name: Whetsel, Early E. Date of Service: 05/30/2017 9:15 AM Medical Record Number: [id number] Patient Account Number: [id number] Date of Birth/Sex: [birth year] (67 y.o. Male) Treating RN: Roger Shelter Primary Care Syenna Nazir: Myrtie Hawk Other Clinician: Referring Emonee Winkowski: Myrtie Hawk Treating Akshat Minehart/Extender: Cathie Olden in Treatment: 35 Active Problems Location of Pain Severity and Description of Pain Patient Has Paino No Site Locations Pain Management and Medication Current Pain Management: Electronic Signature(s) Signed: 05/31/2017 4:58:29 PM By:  Roger Shelter Entered By: Roger Shelter on 05/30/2017 09:41:23 Bailey, [name] [name] ([id number]) -------------------------------------------------------------------------------- Patient/Caregiver Education Details Patient Name: [name] Date of Service: 05/30/2017 9:15 AM Medical Record Number: [id number] Patient Account Number: [id number] Date of Birth/Gender: 09-14-1950 (67 y.o. Male) Treating RN: Roger Shelter Primary Care Physician: Myrtie Hawk Other Clinician: Referring Physician: Myrtie Hawk Treating Physician/Extender: Cathie Olden in Treatment: 66 Education Assessment Education Provided To: Patient Education Topics Provided Wound/Skin Impairment: Handouts: Caring for Your Ulcer Methods: Explain/Verbal Responses: State content correctly Electronic Signature(s) Signed: 05/31/2017 4:58:29 PM By: Roger Shelter Entered By: Roger Shelter on 05/30/2017 10:42:39 Forster, [name] [name] ([id number]) -------------------------------------------------------------------------------- Wound Assessment Details Patient Name: Kolinski, Generoso E. Date of Service: 05/30/2017 9:15 AM Medical Record Number: [id number] Patient Account Number: [id number] Date of Birth/Sex: February 01, 1951 (67 y.o. Male) Treating RN: Roger Shelter Primary Care Marshella Tello: Myrtie Hawk Other Clinician: Referring Kourtlynn Trevor: Myrtie Hawk Treating Dagan Heinz/Extender: Cathie Olden in Treatment: 35 Wound Status Wound Number: 1 Primary Diabetic Wound/Ulcer of the Lower Extremity Etiology: Wound Location: Right Toe Great Wound Open Wounding Event: Gradually Appeared Status: Date Acquired: 06/11/2016 Comorbid Anemia, Lymphedema, Congestive Heart Weeks Of Treatment: 35 History: Failure, Hypertension, Peripheral Venous Clustered Wound: No Disease, Type II Diabetes, Neuropathy Pending Amputation On Presentation Photos Wound Measurements Length: (cm) 2.5 Width: (cm)  4 Depth: (cm) 0.1 Area: (cm) 7.854 Volume: (cm) 0.785 % Reduction in Area: 83.3% % Reduction in Volume: 83.3% Epithelialization: Small (1-33%) Tunneling: No Undermining: No Wound Description Classification: Grade 2 Foul Odor Aft Wound Margin: Flat and Intact Slough/Fibrin Exudate Amount: Large Exudate Type: Serosanguineous Exudate Color: red, brown er Cleansing: No o No Wound Bed Granulation Amount: Medium (34-66%) Exposed Structure Granulation Quality: Red Fascia Exposed: No Necrotic Amount: Small (1-33%) Fat Layer (Subcutaneous Tissue) Exposed: Yes Necrotic Quality: Eschar, Adherent Slough Tendon Exposed: No Muscle Exposed: No Joint Exposed: No Bone Exposed:  No Periwound Skin Texture [name] [name] E. ([id number]) Texture Color No Abnormalities Noted: No No Abnormalities Noted: No Callus: Yes Atrophie Blanche: No Crepitus: No Cyanosis: No Excoriation: Yes Ecchymosis: No Induration: No Erythema: No Rash: No Hemosiderin Staining: No Scarring: No Mottled: No Pallor: No Moisture Rubor: No No Abnormalities Noted: No Dry / Scaly: No Temperature / Pain Maceration: Yes Temperature: No Abnormality Wound Preparation Ulcer Cleansing: Rinsed/Irrigated with Saline Topical Anesthetic Applied: Other: lidocaine 4%, Treatment Notes Wound #1 (Right Toe Great) 1. Cleansed with: Clean wound with Normal Saline 2. Anesthetic Topical Lidocaine 4% cream to wound bed prior to debridement 4. Dressing Applied: Other dressing (specify in notes) 5. Secondary Dressing Applied ABD Pad 6. Footwear/Offloading device applied Other footwear/offloading device applied (specify in notes) 7. Secured with 3 Layer Compression System - Bilateral Notes silver cell on wounds on toes, cover with abd pad and secure with tape. Darko shoes bilateral for off loading Electronic Signature(s) Signed: 05/30/2017 10:02:38 AM By: Montey Hora Signed: 05/31/2017 4:58:29 PM By: Roger Shelter Previous Signature: 05/30/2017 10:01:18 AM Version By: Roger Shelter Entered By: Montey Hora on 05/30/2017 10:02:38 Abshier, Edelmiro EMarland Kitchen ([id number]) -------------------------------------------------------------------------------- Wound Assessment Details Patient Name: Desanctis, Vyron E. Date of Service: 05/30/2017 9:15 AM Medical Record Number: [id number] Patient Account Number: [id number] Date of Birth/Sex: April 30, 1950 (67 y.o. Male) Treating RN: Roger Shelter Primary Care Jahzir Strohmeier: Myrtie Hawk Other Clinician: Referring Barack Nicodemus: Myrtie Hawk Treating Toribio Seiber/Extender: Cathie Olden in Treatment: 35 Wound Status Wound Number: 11 Primary Diabetic Wound/Ulcer of the Lower Extremity Etiology: Wound Location: Right Calcaneus Wound Open Wounding Event: Gradually Appeared Status: Date Acquired: 05/09/2017 Comorbid Anemia, Lymphedema, Congestive Heart Weeks Of Treatment: 2 History: Failure, Hypertension, Peripheral Venous Clustered Wound: No Disease, Type II Diabetes, Neuropathy Photos Wound Measurements Length: (cm) 0.4 Width: (cm) 0.3 Depth: (cm) 0.1 Area: (cm) 0.094 Volume: (cm) 0.009 % Reduction in Area: 76.1% % Reduction in Volume: 76.9% Epithelialization: Large (67-100%) Tunneling: No Undermining: No Wound Description Classification: Grade 2 Wound Margin: Distinct, outline attached Exudate Amount: None Present Foul Odor After Cleansing: No Slough/Fibrino Yes Wound Bed Granulation Amount: Large (67-100%) Exposed Structure Granulation Quality: Red Fascia Exposed: No Necrotic Amount: Small (1-33%) Fat Layer (Subcutaneous Tissue) Exposed: No Necrotic Quality: Adherent Slough Tendon Exposed: No Muscle Exposed: No Joint Exposed: No Bone Exposed: No Periwound Skin Texture Texture Color No Abnormalities Noted: No No Abnormalities Noted: No Callus: Yes Atrophie Blanche: No [name] [name] E. ([id number]) Crepitus: No Cyanosis:  No Excoriation: No Ecchymosis: No Induration: No Erythema: No Rash: No Hemosiderin Staining: No Scarring: No Mottled: No Pallor: No Moisture Rubor: No No Abnormalities Noted: No Dry / Scaly: No Temperature / Pain Maceration: No Temperature: No Abnormality Tenderness on Palpation: Yes Wound Preparation Ulcer Cleansing: Rinsed/Irrigated with Saline Topical Anesthetic Applied: Other: lidocaine 4%, Treatment Notes Wound #11 (Right Calcaneus) 1. Cleansed with: Clean wound with Normal Saline 2. Anesthetic Topical Lidocaine 4% cream to wound bed prior to debridement 7. Secured with 3 Layer Compression System - Bilateral Notes heel cup Electronic Signature(s) Signed: 05/30/2017 10:03:22 AM By: Montey Hora Signed: 05/31/2017 4:58:29 PM By: Roger Shelter Previous Signature: 05/30/2017 10:02:04 AM Version By: Roger Shelter Entered By: Montey Hora on 05/30/2017 10:03:22 Huston, [name] EMarland Kitchen ([id number]) -------------------------------------------------------------------------------- Wound Assessment Details Patient Name: Ell, Tarence E. Date of Service: 05/30/2017 9:15 AM Medical Record Number: [id number] Patient Account Number: [id number] Date of Birth/Sex: 01-22-1951 (67 y.o. Male) Treating RN: Roger Shelter Primary Care Boyd Litaker: Myrtie Hawk Other Clinician: Referring Daison Braxton: Myrtie Hawk Treating Timberlynn Kizziah/Extender: Lawanda Cousins  Weeks in Treatment: 35 Wound Status Wound Number: 2 Primary Diabetic Wound/Ulcer of the Lower Extremity Etiology: Wound Location: Right Toe Second Wound Open Wounding Event: Gradually Appeared Status: Date Acquired: 06/11/2016 Comorbid Anemia, Lymphedema, Congestive Heart Weeks Of Treatment: 35 History: Failure, Hypertension, Peripheral Venous Clustered Wound: No Disease, Type II Diabetes, Neuropathy Pending Amputation On Presentation Photos Wound Measurements Length: (cm) 1 Width: (cm) 2 Depth: (cm) 0.1 Area:  (cm) 1.571 Volume: (cm) 0.157 % Reduction in Area: 48.8% % Reduction in Volume: 48.9% Epithelialization: Medium (34-66%) Tunneling: No Undermining: No Wound Description Classification: Grade 2 Foul Odor Aft Wound Margin: Flat and Intact Slough/Fibrin Exudate Amount: Large Exudate Type: Serosanguineous Exudate Color: red, brown er Cleansing: No o No Wound Bed Granulation Amount: Medium (34-66%) Exposed Structure Granulation Quality: Red Fascia Exposed: No Necrotic Amount: Medium (34-66%) Fat Layer (Subcutaneous Tissue) Exposed: Yes Necrotic Quality: Eschar, Adherent Slough Tendon Exposed: No Muscle Exposed: No Joint Exposed: No Bone Exposed: No Periwound Skin Texture Riquelme, Bobbyjoe E. ([id number]) Texture Color No Abnormalities Noted: No No Abnormalities Noted: No Callus: No Atrophie Blanche: No Crepitus: No Cyanosis: No Excoriation: No Ecchymosis: No Induration: No Erythema: No Rash: No Hemosiderin Staining: No Scarring: Yes Mottled: No Pallor: No Moisture Rubor: No No Abnormalities Noted: No Dry / Scaly: No Temperature / Pain Maceration: Yes Temperature: No Abnormality Wound Preparation Ulcer Cleansing: Rinsed/Irrigated with Saline Topical Anesthetic Applied: Other: lidocaine 4%, Treatment Notes Wound #2 (Right Toe Second) 1. Cleansed with: Clean wound with Normal Saline 2. Anesthetic Topical Lidocaine 4% cream to wound bed prior to debridement 4. Dressing Applied: Other dressing (specify in notes) 5. Secondary Dressing Applied ABD Pad 6. Footwear/Offloading device applied Other footwear/offloading device applied (specify in notes) 7. Secured with 3 Layer Compression System - Bilateral Notes silver cell on wounds on toes, cover with abd pad and secure with tape. Darko shoes bilateral for off loading Electronic Signature(s) Signed: 05/30/2017 10:04:32 AM By: Roger Shelter Previous Signature: 05/30/2017 10:03:14 AM Version By: Roger Shelter Entered By: Roger Shelter on 05/30/2017 10:04:31 Defeo, [name] [name] ([id number]) -------------------------------------------------------------------------------- Wound Assessment Details Patient Name: Jodoin, Haidyn E. Date of Service: 05/30/2017 9:15 AM Medical Record Number: [id number] Patient Account Number: [id number] Date of Birth/Sex: 10-06-1950 (67 y.o. Male) Treating RN: Roger Shelter Primary Care Drue Camera: Myrtie Hawk Other Clinician: Referring Reilley Latorre: Myrtie Hawk Treating Jaxiel Kines/Extender: Cathie Olden in Treatment: 35 Wound Status Wound Number: 5 Primary Diabetic Wound/Ulcer of the Lower Extremity Etiology: Wound Location: Left Toe Great Wound Open Wounding Event: Gradually Appeared Status: Date Acquired: 06/11/2016 Comorbid Anemia, Lymphedema, Congestive Heart Weeks Of Treatment: 35 History: Failure, Hypertension, Peripheral Venous Clustered Wound: No Disease, Type II Diabetes, Neuropathy Pending Amputation On Presentation Photos Wound Measurements Length: (cm) 2.4 % Reduction in Width: (cm) 3 % Reduction in Depth: (cm) 0.1 Epithelializat Area: (cm) 5.655 Tunneling: Volume: (cm) 0.565 Undermining: Area: 60.4% Volume: 60.5% ion: None No No Wound Description Classification: Grade 2 Foul Odor Afte Wound Margin: Flat and Intact Due to Product Exudate Amount: Large Slough/Fibrino Exudate Type: Serosanguineous Exudate Color: red, brown r Cleansing: Yes Use: No No Wound Bed Granulation Amount: Medium (34-66%) Exposed Structure Granulation Quality: Red Fascia Exposed: No Necrotic Amount: Small (1-33%) Fat Layer (Subcutaneous Tissue) Exposed: Yes Necrotic Quality: Adherent Slough Tendon Exposed: No Muscle Exposed: No Joint Exposed: No Bone Exposed: No Periwound Skin Texture Palka, Nikkolas E. ([id number]) Texture Color No Abnormalities Noted: No No Abnormalities Noted: No Callus: No Atrophie Blanche:  No Crepitus: No Cyanosis: No Excoriation: No Ecchymosis: No Induration:  No Erythema: No Rash: No Hemosiderin Staining: No Scarring: No Mottled: No Pallor: No Moisture Rubor: No No Abnormalities Noted: No Dry / Scaly: No Temperature / Pain Maceration: Yes Temperature: No Abnormality Wound Preparation Ulcer Cleansing: Rinsed/Irrigated with Saline Topical Anesthetic Applied: Other: lidocaine 4%, Treatment Notes Wound #5 (Left Toe Great) 1. Cleansed with: Clean wound with Normal Saline 2. Anesthetic Topical Lidocaine 4% cream to wound bed prior to debridement 4. Dressing Applied: Other dressing (specify in notes) 5. Secondary Dressing Applied ABD Pad 6. Footwear/Offloading device applied Other footwear/offloading device applied (specify in notes) 7. Secured with 3 Layer Compression System - Bilateral Notes silver cell on wounds on toes, cover with abd pad and secure with tape. Darko shoes bilateral for off loading Electronic Signature(s) Signed: 05/30/2017 10:05:46 AM By: Roger Shelter Previous Signature: 05/30/2017 10:04:38 AM Version By: Montey Hora Entered By: Roger Shelter on 05/30/2017 10:05:45 Lipuma, [name] [name] ([id number]) -------------------------------------------------------------------------------- Wound Assessment Details Patient Name: Strike, Dom E. Date of Service: 05/30/2017 9:15 AM Medical Record Number: [id number] Patient Account Number: [id number] Date of Birth/Sex: 07/29/1950 (67 y.o. Male) Treating RN: Roger Shelter Primary Care Bree Heinzelman: Myrtie Hawk Other Clinician: Referring Vivan Vanderveer: Myrtie Hawk Treating Joory Gough/Extender: Cathie Olden in Treatment: 35 Wound Status Wound Number: 6 Primary Diabetic Wound/Ulcer of the Lower Extremity Etiology: Wound Location: Left Toe Second Wound Open Wounding Event: Gradually Appeared Status: Date Acquired: 06/11/2016 Comorbid Anemia, Lymphedema, Congestive Heart Weeks  Of Treatment: 35 History: Failure, Hypertension, Peripheral Venous Clustered Wound: No Disease, Type II Diabetes, Neuropathy Pending Amputation On Presentation Photos Wound Measurements Length: (cm) 1.5 % Reduction in Width: (cm) 1.5 % Reduction in Depth: (cm) 0.1 Epithelializat Area: (cm) 1.767 Tunneling: Volume: (cm) 0.177 Undermining: Area: 17.3% Volume: 17.3% ion: Small (1-33%) No No Wound Description Classification: Grade 2 Foul Odor Afte Wound Margin: Flat and Intact Slough/Fibrino Exudate Amount: Large Exudate Type: Serosanguineous Exudate Color: red, brown r Cleansing: No No Wound Bed Granulation Amount: Large (67-100%) Exposed Structure Granulation Quality: Red Fascia Exposed: No Necrotic Amount: Small (1-33%) Fat Layer (Subcutaneous Tissue) Exposed: Yes Necrotic Quality: Adherent Slough Tendon Exposed: No Muscle Exposed: No Joint Exposed: No Bone Exposed: No Periwound Skin Texture Dirocco, Jonerik E. ([id number]) Texture Color No Abnormalities Noted: No No Abnormalities Noted: No Callus: No Atrophie Blanche: No Crepitus: No Cyanosis: No Excoriation: No Ecchymosis: No Induration: No Erythema: No Rash: No Hemosiderin Staining: No Scarring: No Mottled: No Pallor: No Moisture Rubor: No No Abnormalities Noted: No Dry / Scaly: No Temperature / Pain Maceration: Yes Temperature: No Abnormality Wound Preparation Ulcer Cleansing: Rinsed/Irrigated with Saline Topical Anesthetic Applied: Other: lidocaine 4%, Treatment Notes Wound #6 (Left Toe Second) 1. Cleansed with: Clean wound with Normal Saline 2. Anesthetic Topical Lidocaine 4% cream to wound bed prior to debridement 4. Dressing Applied: Other dressing (specify in notes) 5. Secondary Dressing Applied ABD Pad 6. Footwear/Offloading device applied Other footwear/offloading device applied (specify in notes) 7. Secured with 3 Layer Compression System - Bilateral Notes silver cell on  wounds on toes, cover with abd pad and secure with tape. Darko shoes bilateral for off loading Electronic Signature(s) Signed: 05/30/2017 10:06:46 AM By: Roger Shelter Previous Signature: 05/30/2017 10:05:38 AM Version By: Montey Hora Entered By: Roger Shelter on 05/30/2017 10:06:45 [place], [name] [name] ([id number]) -------------------------------------------------------------------------------- Wound Assessment Details Patient Name: Burnley, Kawhi E. Date of Service: 05/30/2017 9:15 AM Medical Record Number: [id number] Patient Account Number: [id number] Date of Birth/Sex: 21-Oct-1950 (67 y.o. Male) Treating RN: Roger Shelter Primary Care Elantra Caprara: Myrtie Hawk Other Clinician: Referring  Khaleesi Gruel: Myrtie Hawk Treating Wane Mollett/Extender: Cathie Olden in Treatment: 35 Wound Status Wound Number: 9 Primary Diabetic Wound/Ulcer of the Lower Extremity Etiology: Wound Location: Right Toe Third Wound Open Wounding Event: Gradually Appeared Status: Date Acquired: 12/31/2016 Comorbid Anemia, Lymphedema, Congestive Heart Weeks Of Treatment: 21 History: Failure, Hypertension, Peripheral Venous Clustered Wound: No Disease, Type II Diabetes, Neuropathy Photos Wound Measurements Length: (cm) 0.3 % Reduction in Width: (cm) 0.3 % Reduction in Depth: (cm) 0.1 Epithelializat Area: (cm) 0.071 Volume: (cm) 0.007 Area: 81.9% Volume: 82.1% ion: None Wound Description Classification: Grade 2 Foul Odor Afte Wound Margin: Flat and Intact Slough/Fibrino Exudate Amount: Small Exudate Type: Serous Exudate Color: amber r Cleansing: No Yes Wound Bed Granulation Amount: Large (67-100%) Exposed Structure Granulation Quality: Red Fascia Exposed: No Necrotic Amount: Small (1-33%) Fat Layer (Subcutaneous Tissue) Exposed: Yes Necrotic Quality: Adherent Slough Tendon Exposed: No Muscle Exposed: No Joint Exposed: No Bone Exposed: No Periwound Skin Texture Texture  Color Mcquade, Mayford E. ([id number]) No Abnormalities Noted: No No Abnormalities Noted: No Callus: No Atrophie Blanche: No Crepitus: No Cyanosis: No Excoriation: No Ecchymosis: No Induration: No Erythema: No Rash: No Hemosiderin Staining: No Scarring: No Mottled: No Pallor: No Moisture Rubor: No No Abnormalities Noted: No Dry / Scaly: No Temperature / Pain Maceration: No Temperature: No Abnormality Wound Preparation Ulcer Cleansing: Rinsed/Irrigated with Saline Topical Anesthetic Applied: Other: lidocaine 4%, Treatment Notes Wound #9 (Right Toe Third) 1. Cleansed with: Clean wound with Normal Saline 2. Anesthetic Topical Lidocaine 4% cream to wound bed prior to debridement 4. Dressing Applied: Other dressing (specify in notes) 5. Secondary Dressing Applied ABD Pad 6. Footwear/Offloading device applied Other footwear/offloading device applied (specify in notes) 7. Secured with 3 Layer Compression System - Bilateral Notes silver cell on wounds on toes, cover with abd pad and secure with tape. Darko shoes bilateral for off loading Electronic Signature(s) Signed: 05/30/2017 10:07:23 AM By: Roger Shelter Previous Signature: 05/30/2017 10:06:07 AM Version By: Montey Hora Entered By: Roger Shelter on 05/30/2017 10:07:22 Bruski, [name] [name] ([id number]) -------------------------------------------------------------------------------- Vitals Details Patient Name: Lindon, Javyn E. Date of Service: 05/30/2017 9:15 AM Medical Record Number: [id number] Patient Account Number: [id number] Date of Birth/Sex: 1950/05/29 (67 y.o. Male) Treating RN: Roger Shelter Primary Care Thetis Schwimmer: Myrtie Hawk Other Clinician: Referring Jourdyn Ferrin: Myrtie Hawk Treating Yenesis Even/Extender: Cathie Olden in Treatment: 35 Vital Signs Time Taken: 09:41 Temperature (F): 98.3 Height (in): 69 Pulse (bpm): 82 Weight (lbs): 168 Respiratory Rate (breaths/min): 18 Body  Mass Index (BMI): 24.8 Blood Pressure (mmHg): 162/87 Reference Range: 80 - 120 mg / dl Electronic Signature(s) Signed: 05/31/2017 4:58:29 PM By: Roger Shelter Entered By: Roger Shelter on 05/30/2017 09:41:39

## 2017-06-03 ENCOUNTER — Telehealth: Payer: Self-pay | Admitting: *Deleted

## 2017-06-03 MED ORDER — VITAMIN B-12 1000 MCG PO TABS: 1000.0000 ug | ORAL_TABLET | Freq: Every day | ORAL | 3 refills | Status: AC

## 2017-06-03 NOTE — Telephone Encounter (Signed)
Called pt and spoke to him about his ferritin level low and Dr. Daiva Nakayama dlike to add feraheme on to his appts when he comes once a week to get injections.  Also b12 levels low and she would like to start him on vitamin b12 over the counter. I told him that I would send a rx to his pharmacy for the b12 and they will probably take it off the shelf and put label on it but I sent it for him to take one day.  Also he is agreeable to the iron treatments but we will not be able to start him on this til 3/7 and then repeat iron on 3/14.  He is agreeable and I have to call his cousin Theodoro Doing because he drives him to the appts. I called Theodoro Doing and asked him if it is ok. He will come at same time but the visit will be longer due to the feraheme being given and he will have to come upstairs level 1 to get infusion and they will also give him the procrit inj. Too. He is agreeable.

## 2017-06-03 NOTE — Progress Notes (Signed)
Hematology/Oncology Consult note [facility]  Telephone:([phone] Fax:([phone]  Patient Care Team: McLean-Scocuzza, Nino Glow, MD as PCP - General (Internal Medicine)   Name of the patient: [name]  [id number]  06-Apr-1951   Date of visit: 06/03/17  Diagnosis- 1. Anemia- multifactorial- iron deficiency, chronic disease and anemia of kidney disease  2. 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. 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 Baruch Gouty. Patient had CT abdomen in July 2016 which showed retroperitoneal, celiac and gastric adenopathy. MR pelvis in Oct 2017 showed b/l inguinal and external iliac adenopathy as well. Inguinal LN biopsy was negative for malignancy.   2. Biopsy of the skin lesion showed: DIAGNOSIS:  A. PERIANAL POLYP, LEFT; EXCISION:  - SKIN WITH POLYPOID VASCULAR PROLIFERATION WITH SUPERFICIAL NECROSIS,  SEE COMMENT.   B. PERIANAL MASS, RIGHT; INCISIONAL BIOPSY:  - INVASIVE CARCINOMA WITH BASALOID FEATURES.  - SEE COMMENT.   Comment #1:  The differential diagnosis for the perianal polyp includes pyogenic  granuloma and ulcerated hemangioma.   Comment #2:  Immunohistochemical stains were performed. The carcinoma is positive for  p16, BerEp4, and BCL-2. Stain controls worked appropriately.  Unfortunately the pattern of staining does not assist with further  classification of the carcinoma. Clinical correlation is required to  determine if the lesion involves perianal skin primarily (BCC) or is  arising in the anal canal (squamous cell carcinoma with basaloid  features).   This was followed by RT to the lesion.  3. He had repeat CT abdomen in feb 2018 which showed: IMPRESSION: Progressive hepatic cirrhosis since  prior study. No evidence of hepatic neoplasm.  New moderate ascites, diffuse mesenteric and body wall edema, and small bilateral pleural effusions. Mild diffuse small bowel wall thickening, consistent with hypoalbuminemia.  Findings consistent with portal venous hypertension, including mild splenomegaly and upper abdominal venous collaterals.  Stable mild abdominal lymphadenopathy. Mild increase in bilateral iliac and inguinal lymphadenopathy in the pelvis, which may be reactive in etiology, with metastatic disease considered less Likely.  4. He underwent egd and colonoscopy for his microcytic anemia which showed no overt bleeding lesions or malignancy  5. CBC on 09/18/2016 showed H&H of 6.5/19.8 and MCV of 86.7. Labs suggestive of iron deficiency anemia. B12 and folate was within normal limits. Multiple myeloma panel showed IGG monoclonal protein of 0.9 gm. Polyclonal gammopathyHaptoglobin and no copper was normal. Reticulocyte count was mildly elevated at 7.9. Patient received 2 doses of ferriheme on 620 and 10/03/2016. He also received 1 unit of blood transfusion  6. EGD showed a normal duodenum and normal esophagus. Found to have gastritis which was biopsied. Also found to have a large polypoid mass in the left piriform sinus and nonobstructing the airway. This was subsequently evaluated by ENT which did not find any evidence of mass. The colonoscopy showed appendixthat was appearing protruding and ordered this blood with stool. No mucus seen in the vicinity. He was evaluated by a [facility] and has been considered for a repeat colonoscopy versus endoscopy mucosal resection of the mucocele  7.  Bone marrow biopsy showed  hypercellular marrow for age.  9% plasma cells.  Clonality of cells could not be evaluated because of lack of clot sections available for staining.  Cytogenetics were normal.  FISH studies were negative for any chromosomal abnormalities as well.    Interval  history- patient feels at his baseline. He has chronic fatigue that has remained unchanged. Appetite is fair and he has not had any unintentional weight loss.   ECOG PS- 1 Pain scale- 0   Review of systems- Review of Systems  Constitutional: Positive for malaise/fatigue. Negative for chills, fever and weight loss.  HENT: Negative for congestion, ear discharge and nosebleeds.   Eyes: Negative for blurred vision.  Respiratory: Negative for cough, hemoptysis, sputum production, shortness of breath and wheezing.   Cardiovascular: Negative for chest pain, palpitations, orthopnea and claudication.  Gastrointestinal: Negative for abdominal pain, blood in stool, constipation, diarrhea, heartburn, melena, nausea and vomiting.  Genitourinary: Negative for dysuria, flank pain, frequency, hematuria and urgency.  Musculoskeletal: Negative for back pain, joint pain and myalgias.  Skin: Negative for rash.  Neurological: Negative for dizziness, tingling, focal weakness, seizures, weakness and headaches.  Endo/Heme/Allergies: Does not bruise/bleed easily.  Psychiatric/Behavioral: Negative for depression and suicidal ideas. The patient does not have insomnia.      No Known Allergies   Past Medical History:  Diagnosis Date  . Anemia   . CHF (congestive heart failure) ([place])   . Diabetes mellitus    Patient take Metformin.  Marland Kitchen GERD (gastroesophageal reflux disease)   . Hypertension   . Leg swelling   . Loose, teeth    PATIENT STATES "HAS 6 TEETH, SOME ARE LOOSE"  . Neuromuscular disorder ([place])    RIGHT HAND AND FINGERS NUMB  . Shortness of breath dyspnea   . 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 02/24/2016     Past Surgical History:  Procedure Laterality Date  . APPENDECTOMY    . COLONOSCOPY WITH PROPOFOL N/A 11/19/2014   Procedure: COLONOSCOPY WITH PROPOFOL;  Surgeon: Lucilla Lame, MD;  Location: [facility];  Service: Endoscopy;  Laterality: N/A;  DIABETIC-ORAL MEDS  . COLONOSCOPY  WITH PROPOFOL N/A 10/04/2016   Procedure: COLONOSCOPY WITH PROPOFOL;  Surgeon: Jonathon Bellows, MD;  Location: [facility] ENDOSCOPY;  Service: Endoscopy;  Laterality: N/A;  . ESOPHAGOGASTRODUODENOSCOPY (EGD) WITH PROPOFOL N/A 10/04/2016   Procedure: ESOPHAGOGASTRODUODENOSCOPY (EGD) WITH PROPOFOL;  Surgeon: Jonathon Bellows, MD;  Location: [facility] ENDOSCOPY;  Service: Endoscopy;  Laterality: N/A;  . GIVENS CAPSULE STUDY N/A 11/13/2016   Procedure: GIVENS CAPSULE STUDY;  Surgeon: Jonathon Bellows, MD;  Location[facility]COPY;  Service: Gastroenterology;  Laterality: N/A;  . INGUINAL LYMPH NODE BIOPSY Right 01/26/2016   Procedure: INGUINAL LYMPH NODE BIOPSY;  Surgeon: Clayburn Pert, MD;  Location: [facility] ORS;  Service: General;  Laterality: Right;  . RECTAL BIOPSY N/A 11/02/2014   Procedure: BIOPSY RECTAL;  Surgeon: Marlyce Huge, MD;  Location: [facility] ORS;  Service: General;  Laterality: N/A;  . RECTAL EXAM UNDER ANESTHESIA N/A 11/02/2014   Procedure: RECTAL EXAM UNDER ANESTHESIA;  Surgeon: Marlyce Huge, MD;  Location: [facility] ORS;  Service: General;  Laterality: N/A;  . TONSILLECTOMY      Social History   Socioeconomic History  . Marital status: Divorced    Spouse name: Not on file  . Number of children: Not on file  . Years of education: Not on file  . Highest education level: Not on file  Social Needs  . Financial resource strain: Not on file  . Food insecurity - worry: Not on file  . Food insecurity - inability: Not on file  . Transportation needs - medical: Not on file  . Transportation needs -  non-medical: Not on file  Occupational History  . Not on file  Tobacco Use  . Smoking status: Current Every Day Smoker    Packs/day: 0.25    Years: 20.00    Pack years: 5.00    Types: Cigarettes  . Smokeless tobacco: Never Used  . Tobacco comment: pt claims no smoking x 3 d  Substance and Sexual Activity  . Alcohol use: No    Comment: per pt used to drink beer daily- quit may/2018  . Drug use:  Yes    Types: Marijuana    Comment: Last Use 2016 per patient  . Sexual activity: No  Other Topics Concern  . Not on file  Social History Narrative  . Not on file    Family History  Problem Relation Age of Onset  . Asthma Mother      Current Outpatient Medications:  .  amLODipine (NORVASC) 10 MG tablet, Take by mouth., Disp: , Rfl:  .  aspirin EC 81 MG tablet, Take 81 mg by mouth daily., Disp: , Rfl:  .  atorvastatin (LIPITOR) 40 MG tablet, Take 40 mg by mouth at bedtime., Disp: , Rfl:  .  chlorthalidone (HYGROTON) 25 MG tablet, Take by mouth., Disp: , Rfl:  .  ferrous sulfate 325 (65 FE) MG tablet, Take by mouth., Disp: , Rfl:  .  hydrALAZINE (APRESOLINE) 25 MG tablet, Take 25 mg 2 (two) times daily by mouth., Disp: , Rfl:  .  omeprazole (PRILOSEC) 40 MG capsule, Take by mouth., Disp: , Rfl:  .  polyethylene glycol (MIRALAX / GLYCOLAX) packet, Take by mouth., Disp: , Rfl:   Current Facility-Administered Medications:  .  albumin human 25 % solution 50 g, 50 g, Intravenous, Once, Jonathon Bellows, MD .  albumin human 25 % solution 50 g, 50 g, Intravenous, Once, Jonathon Bellows, MD  Facility-Administered Medications Ordered in Other Visits:  .  epoetin alfa (EPOGEN,PROCRIT) injection 40,000 Units, 40,000 Units, Subcutaneous, Weekly, Sindy Guadeloupe, MD, 40,000 Units at 05/23/17 1145  Physical exam:  Vitals:   05/16/17 1115 05/16/17 1117 05/16/17 1120  BP: (!) 179/91 (!) 178/91   Pulse:   72  Resp: 18    Temp:  (!) 97.3 F (36.3 C)   TempSrc: Tympanic Tympanic   Weight: 173 lb (78.5 kg)     Physical Exam  Constitutional: He is oriented to person, place, and time.  Thin, appears older than stated age. In no acute distress.  HENT:  Head: Normocephalic and atraumatic.  Eyes: EOM are normal. Pupils are equal, round, and reactive to light.  Neck: Normal range of motion.  Cardiovascular: Normal rate, regular rhythm and normal heart sounds.  Pulmonary/Chest: Effort normal and breath  sounds normal.  Abdominal: Soft. Bowel sounds are normal.  Musculoskeletal: He exhibits edema.  Neurological: He is alert and oriented to person, place, and time.  Skin: Skin is warm and dry.     CMP Latest Ref Rng & Units 05/07/2017  Glucose 65 - 99 mg/dL 109(H)  BUN 6 - 20 mg/dL 29(H)  Creatinine 0.61 - 1.24 mg/dL 1.92(H)  Sodium 135 - 145 mmol/L 130(L)  Potassium 3.5 - 5.1 mmol/L 4.5  Chloride 101 - 111 mmol/L 104  CO2 22 - 32 mmol/L 21(L)  Calcium 8.9 - 10.3 mg/dL 8.1(L)  Total Protein 6.5 - 8.1 g/dL 7.4  Total Bilirubin 0.3 - 1.2 mg/dL 0.6  Alkaline Phos 38 - 126 U/L 80  AST 15 - 41 U/L 46(H)  ALT 17 -  63 U/L 29   CBC Latest Ref Rng & Units 05/30/2017  WBC 3.8 - 10.6 K/uL -  Hemoglobin 13.0 - 18.0 g/dL 10.1(L)  Hematocrit 40.0 - 52.0 % -  Platelets 150 - 440 K/uL -    No images are attached to the encounter.  Dg Foot Complete Left  Result Date: 05/16/2017 CLINICAL DATA:  Nonhealing wounds on the left of the toes EXAM: LEFT FOOT - COMPLETE 3+ VIEW COMPARISON:  Left foot films of 09/21/2016 FINDINGS: There is slight erosion of the tuft of the distal phalanges of the left great toe and left second toe, but this may have been present to some degree previously. Active osteomyelitis is a definite consideration. No other erosion or focal demineralization is seen. Joint spaces appear normal. The calcaneus is unremarkable on the lateral view. IMPRESSION: Slight erosion of the tuft of the distal phalanges of the left first and second toes suspicious for osteomyelitis as noted above. Electronically Signed   By: Ivar Drape M.D.   On: 05/16/2017 15:11   Dg Foot Complete Right  Result Date: 05/16/2017 CLINICAL DATA:  Nonhealing wound on the heel of the right foot EXAM: RIGHT FOOT COMPLETE - 3+ VIEW COMPARISON:  Right foot films of 01/28/2017 FINDINGS: As noted on prior studies, erosion of the tuft of the distal phalanx of the right great toe and the distal phalanx of the right second toe is  again noted. On the oblique view there also is suggestion of some erosion of the tuft of the distal phalanx of the right third toe. The distal phalanges of the fourth and fifth toes appear unremarkable on the images obtained. Joint spaces appear normal. No definite cortical irregularity or erosion of the calcaneus is seen on the images obtained. A small plantar calcaneal degenerative spur is present. IMPRESSION: Impression 1. Erosion of the tufts of the distal phalanges of the right first, second and possibly third toes consistent with osteomyelitis. 2. No definite abnormality of the calcaneus is seen on the images obtained. 3. Plantar calcaneal degenerative spur. Electronically Signed   By: Ivar Drape M.D.   On: 05/16/2017 15:08     Assessment and plan- Patient is a 67 y.o. male with normocytic anemia multifactorial - anemia of chronic kidney disease along with anemia of chronic disease and iron deficiency  Anemia-  Since 2016 patients hb has been between 8-9. He did have some evidence of iron deficiency on past labs. His anemia did not improve despite getting IV iron. His hemoglobin intermittently drops to 6.4-6.8 requiring blood transfusion. Since may 2018 his creatinine has been around 1.9. Bone marrow biopsy showed 9% plasma cells but clonality could not be determined. FISH and cytogenetics have been normal. He has been on weekly procrit and hemoglobin does repond and increase to upto 9 with it. Today hb is 9.7  IgG MGUS- He will continue weekly procrit at this point. I will repeat myeloma panel and serum free light chains in 6 weeks. If there is worsening of M protein or progressively worsening free light chain ratio and kidney function, I may have to consider repeat bone marrow biopsy  Repeat iron studies, b12 with next dose of procrit in 1 weeks time  Visit Diagnosis 1. MGUS (monoclonal gammopathy of unknown significance)   2. Anemia of chronic renal failure, unspecified CKD stage      Dr.  Randa Evens, MD, MPH [facility] Pager- [phone] 06/03/2017 12:31 PM

## 2017-06-06 ENCOUNTER — Inpatient Hospital Stay: Payer: Medicare HMO

## 2017-06-06 ENCOUNTER — Encounter: Payer: Medicare HMO | Admitting: Nurse Practitioner

## 2017-06-06 DIAGNOSIS — E11621 Type 2 diabetes mellitus with foot ulcer: Secondary | ICD-10-CM | POA: Diagnosis not present

## 2017-06-06 DIAGNOSIS — D472 Monoclonal gammopathy: Secondary | ICD-10-CM | POA: Diagnosis not present

## 2017-06-06 DIAGNOSIS — D638 Anemia in other chronic diseases classified elsewhere: Secondary | ICD-10-CM

## 2017-06-06 LAB — HEMOGLOBIN: HEMOGLOBIN: 10.8 g/dL — AB (ref 13.0–18.0)

## 2017-06-07 NOTE — Progress Notes (Addendum)
KILO, ESHELMAN ([id number]) Visit Report for 06/06/2017 Chief Complaint Document Details Patient Name: [name], [name] Date of Service: 06/06/2017 9:45 AM Medical Record Number: [id number] Patient Account Number: [id number] Date of Birth/Sex: 1950/10/06 (67 y.o. Male) Treating RN: Ahmed Prima Primary Care Provider: Lamonte Sakai Other Clinician: Referring Provider: Lamonte Sakai Treating Provider/Extender: Cathie Olden in Treatment: 75 Information Obtained from: Patient Chief Complaint Patient presents for treatment of an open diabetic ulcer to both feet Electronic Signature(s) Signed: 06/06/2017 11:58:58 AM By: Lawanda Cousins Entered By: Lawanda Cousins on 06/06/2017 11:58:58 Matzke, [name] ([id number]) -------------------------------------------------------------------------------- Debridement Details Patient Name: [name], [name] Date of Service: 06/06/2017 9:45 AM Medical Record Number: [id number] Patient Account Number: [id number] Date of Birth/Sex: [birth year] (67 y.o. Male) Treating RN: Ahmed Prima Primary Care Provider: Lamonte Sakai Other Clinician: Referring Provider: Lamonte Sakai Treating Provider/Extender: Cathie Olden in Treatment: 36 Debridement Performed for Wound #1 Right Toe Great Assessment: Performed By: Physician Lawanda Cousins, NP Debridement: Debridement Severity of Tissue Pre Fat layer exposed Debridement: Pre-procedure Verification/Time Yes - 10:35 Out Taken: Start Time: 10:36 Pain Control: Lidocaine 4% Topical Solution Level: Skin/Subcutaneous Tissue Total Area Debrided (L x W): 2.5 (cm) x 4 (cm) = 10 (cm) Tissue and other material Exudate, Fibrin/Slough, Subcutaneous debrided: Instrument: Curette Bleeding: Minimum Hemostasis Achieved: Pressure End Time: 10:38 Procedural Pain: 0 Post Procedural Pain: 0 Response to Treatment: Procedure was tolerated well Post Debridement Measurements of Total Wound Length: (cm) 2.5 Width: (cm)  4 Depth: (cm) 0.2 Volume: (cm) 1.571 Character of Wound/Ulcer Post Debridement: Requires Further Debridement Severity of Tissue Post Debridement: Fat layer exposed Post Procedure Diagnosis Same as Pre-procedure Electronic Signature(s) Signed: 06/06/2017 4:59:29 PM By: Alric Quan Signed: 06/13/2017 1:53:22 PM By: Lawanda Cousins Entered By: Alric Quan on 06/06/2017 10:38:21 Sillas, Karol E. ([id number]) -------------------------------------------------------------------------------- Debridement Details Patient Name: [name], [name] Date of Service: 06/06/2017 9:45 AM Medical Record Number: [id number] Patient Account Number: [id number] Date of Birth/Sex: [birth year] (67 y.o. Male) Treating RN: Ahmed Prima Primary Care Provider: Lamonte Sakai Other Clinician: Referring Provider: Lamonte Sakai Treating Provider/Extender: Cathie Olden in Treatment: 36 Debridement Performed for Wound #2 Right Toe Second Assessment: Performed By: Physician Lawanda Cousins, NP Debridement: Debridement Severity of Tissue Pre Fat layer exposed Debridement: Pre-procedure Verification/Time Yes - 10:35 Out Taken: Start Time: 10:39 Pain Control: Lidocaine 4% Topical Solution Level: Skin/Subcutaneous Tissue Total Area Debrided (L x W): 0.6 (cm) x 1.5 (cm) = 0.9 (cm) Tissue and other material Exudate, Fibrin/Slough, Subcutaneous debrided: Instrument: Curette Bleeding: Minimum Hemostasis Achieved: Pressure End Time: 10:40 Procedural Pain: 0 Post Procedural Pain: 0 Response to Treatment: Procedure was tolerated well Post Debridement Measurements of Total Wound Length: (cm) 0.6 Width: (cm) 1.5 Depth: (cm) 0.2 Volume: (cm) 0.141 Character of Wound/Ulcer Post Debridement: Requires Further Debridement Severity of Tissue Post Debridement: Fat layer exposed Post Procedure Diagnosis Same as Pre-procedure Electronic Signature(s) Signed: 06/06/2017 4:59:29 PM By: Alric Quan Signed:  06/13/2017 1:53:22 PM By: Lawanda Cousins Entered By: Alric Quan on 06/06/2017 10:39:38 Droke, [name] ([id number]) -------------------------------------------------------------------------------- Debridement Details Patient Name: [name], [name] Date of Service: 06/06/2017 9:45 AM Medical Record Number: [id number] Patient Account Number: [id number] Date of Birth/Sex: Nov 28, 1950 (67 y.o. Male) Treating RN: Ahmed Prima Primary Care Provider: Lamonte Sakai Other Clinician: Referring Provider: Lamonte Sakai Treating Provider/Extender: Cathie Olden in Treatment: 36 Debridement Performed for Wound #6 Left Toe Second Assessment: Performed By: Physician Lawanda Cousins, NP Debridement: Debridement Severity of Tissue Pre Fat layer exposed Debridement: Pre-procedure Verification/Time Yes -  10:35 Out Taken: Start Time: 10:40 Pain Control: Lidocaine 4% Topical Solution Level: Skin/Subcutaneous Tissue Total Area Debrided (L x W): 1.1 (cm) x 0.8 (cm) = 0.88 (cm) Tissue and other material Viable, Non-Viable, Exudate, Fibrin/Slough, Subcutaneous debrided: Instrument: Curette Bleeding: Minimum Hemostasis Achieved: Pressure End Time: 10:41 Procedural Pain: 0 Post Procedural Pain: 0 Response to Treatment: Procedure was tolerated well Post Debridement Measurements of Total Wound Length: (cm) 1.1 Width: (cm) 0.8 Depth: (cm) 0.2 Volume: (cm) 0.138 Character of Wound/Ulcer Post Debridement: Requires Further Debridement Severity of Tissue Post Debridement: Fat layer exposed Post Procedure Diagnosis Same as Pre-procedure Electronic Signature(s) Signed: 06/06/2017 4:59:29 PM By: Alric Quan Signed: 06/13/2017 1:53:22 PM By: Lawanda Cousins Entered By: Alric Quan on 06/06/2017 10:40:27 Kruczek, [name] ([id number]) -------------------------------------------------------------------------------- HPI Details Patient Name: [name], [name] Date of Service: 06/06/2017 9:45  AM Medical Record Number: [id number] Patient Account Number: [id number] Date of Birth/Sex: 10/19/1950 (67 y.o. Male) Treating RN: Ahmed Prima Primary Care Provider: Lamonte Sakai Other Clinician: Referring Provider: Lamonte Sakai Treating Provider/Extender: Cathie Olden in Treatment: 71 History of Present Illness Location: bilateral feet ulceration on the toes Quality: Patient reports experiencing a dull pain to affected area(s). Severity: Patient states wound are getting better Duration: Patient has had the wound for > 3 months prior to seeking treatment at the wound center Timing: Pain in wound is constant (hurts all the time) Context: The wound would happen gradually Modifying Factors: Other treatment(s) tried include:treatment for lymphedema and is seen by the podiatrist Dr. Caryl Comes Associated Signs and Symptoms: Patient reports having increase swelling. HPI Description: 67 year old patient here to see as for bilateral feet ulceration to on his left first and second toe and 2 on his right first and second toe, which she's had for about 4 months. 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 Donella Stade. The patient is also undergoing workup by medical oncology for a intra-abdominal lymphadenopathy. 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 He is also status post appendectomy, inguinal lymph node biopsy, rectal biopsy and rectal examination under anesthesia. he currently smokes cigarettes about half packet a day. In March of this year he was seen by Dr. Hortencia Pilar, for evaluation of bilateral varicose veins and besides wearing compression stockings he had recommended laser ablation of the right and left great saphenous veins to eleviate  the symptoms and complications of severe superficial venous reflux disease. He also recommended lymphedema pumps for better control of his lymphedema. The patient recently has had on 08/23/2016, right greater saphenous vein ablation with the laser energy Earlier lower extremity venous reflux examination done on 05/08/2016 showed no DVT or SVT both lower legs but incompetence of bilateral great saphenous veins was present. A lower arterial study was also done and there was no significant right lower and left lower extremity problems based on a normal toe brachial index bilaterally and the ABI was 1.21 the left and 1.23 on the right. His post ablation venous duplex examination showed successful ablation of the right GS vein with thrombus formation 2 below the right saphenofemoral junction. The deep system was patent without evidence of thrombosis and this was done on 08/30/2016. the patient also has a squamous cell cancer of the skin of the buttock and is recently undergone radiation therapy  for this prior to excisional surgery. Addendum: regarding his x-rays done today and x-ray of the left foot -- IMPRESSION: No objective evidence of osteomyelitis. There are soft tissue changes which may reflect cellulitis. X-ray of the right foot -- IMPRESSION:Findings compatible with cellulitis of the toes. No objective evidence of osteomyelitis is observed. 10/01/16 on evaluation today patient's wounds appeared to be doing some better. I did review the x-rays as well which showed no evidence of osteomyelitis although there was evidence on x-ray of cellulitis. He fortunately is not having any discomfort although he continues to have some swelling. He does not remember being on any antibiotics recently. 10/15/16 on evaluation today patient's wounds overall appear to be doing better although he does have a new location noted on the left foot. Fortunately he is not having significant pain. It almost has the  appearance that something is rubbing on the end of his toes but he wears the open toe shoes and according to what he is telling me never wears anything that would rub on his foot. There is no evidence of infection and specifically no evidence of a fungal infection 10/22/16 On evaluation today patient's wounds appeared to be doing better compared to last week in regard to his bilateral Sylvan, Bralyn E. ([id number]) lower extremities. Fortunately I happy with how things are progressing although he still has ulcers I feel like that he is improving and appropriate manner. 11/12/16 on evaluation today patient appears to be doing well in regard to his bilateral feet and the respective wounds. We have been using surrounding her dressings along with an antifungal cream which seems to be doing very well. He has no bilateral dysfunction noticed that the rituals are weight loss at this point. He also has no nausea or vomiting a note purulent discharge. He did see Vein and vascular today and he tells me that they told him he could have surgery for his venous stasis but they did not feel like it was worth it in his words. Fortunately patient's wounds do appear to be getting sneakily better. 11/26/2016 -- he says he is going to have some surgery during this week at [facility] for possibly a colon resection. 12/31/2016 -- the patient has been noncompliant with his smoking and I'm not sure whether he is also started drinking again. He continues to be very nonchalant about his care 01/14/2017 -- the patient's HandP has been reviewed well and I understand he is being compliant with trying to give up smoking and his local dressing changes. He does not have any surgical options of 4 to him by his vascular surgeons.he was last seen in early August by Dr. Hortencia Pilar who recommended compression stockings,and possibly lymph pumps in 2-3 months after doing a review ultrasound. 01/28/2017 - the patient did not have any  fresh complaints but on examination I noted a large lacerated wound on the plantar aspect of his right fourth toe which had a lot of necrotic debris and it probes down to bone. 02/07/2017 -- x-ray of the right foot -- IMPRESSION: Soft tissue swelling about the first through fourth toes consistent with cellulitis. New destructive change in the tuft of the distal phalanx of the great toe is consistent with osteomyelitis. 02/14/2017 -- the patient's MRI is pending this coming Monday and he still continues to smoke. We have again gone over off loading of his wounds in great detail and he says he's been compliant. 02/21/2017 -- MR of the right foot -- IMPRESSION: 1.  Soft tissue ulcer at the tip of the first, second and third toe knows. Cortical irregularity and bone marrow edema in the first distal phalanx most concerning for osteomyelitis. Mild marrow edema in the second and third distal phalanx without definite cortical destruction which may reflect early osteomyelitis versus reactive marrow edema. 2. Soft tissue edema surrounding the first phalanx most consistent with cellulitis. the patient was also recently evaluated by his medical oncologist Dr. 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 She is treating him with weekly Procrit. She is also keeping intra-abdominal lymphadenopathy and right lower lobe lung nodule under observation. 04/04/2017 -- he was seen by Dr. Adrian Prows on 03/25/2017 -- after review he empirically put him on ciprofloxacin and doxycycline as they have good bone penetration and good bioavailability and it will cover the usual pathogens and diabetic foot osteomyelitis. He will check inflammatory markers and plan a 77-30 week old records. C-reactive protein was 0.3 and the ESR was 72 04/18/17 on evaluation today patient appears to be doing about the same in regard to his lower extremity wounds  bilaterally. He has continued to use the antifungal cream which does seem to be beneficial. Nonetheless the ulcers do seem to in some areas be epithelial eyes over and in other areas are still open. He is having no significant discomfort. 04/25/17-he is here in follow-up evaluation for multiple ulcerations to multiple toes bilaterally. He states he did see Dr. Ola Spurr again last week and continues antibiotic therapy. He is voicing no complaints or concerns, will continue with current treatment plan will possibility of adding compression therapy next week after an additional week of treatment/lotions to BLE prescribed by Dr Ola Spurr 05/02/17 he is here in follow up for for multiple ulcers to multiple toes bilaterally. we will stop using antifungal cream and will continue with silvercel and follow up next week 05/09/17-he is here in follow-up for multiple ulcerations to multiple toes bilaterally. There is improvement in appearance. He has not completely stopped using antifungal cream, but admits he has not using it between the toes. He has an appointment with Dr. Ola Spurr on 2/11, continues on doxycycline and Cipro. It has been 5 weeks of antibiotic therapy, we will order plain film xray to evaluate for osteomyelitis next week, prior to follow up with ID. Will continue with silvercel and follow up next week 05/16/17-he is here in follow-up evaluation for multiple ulcerations to multiple toes bilaterally and new wound to the right posterior heel. There is essentially no change in appearance, deteriorating measurements; he has a history of waxing and waning measurements. He admits that he continues to apply moisturizer/cream/ointment to his toes despite weekly reminders to only apply silvercel to his toes. He states that he thinks the surgical shoe contributed to the superficial ulcer to his posterior Decaire, Edan E. ([id number]) heel, he is unable to articulate if this was an area of dry cracked skin  as he has a similar area to the left heel. He now is wearing open toed slippers. He has an appointment with Dr. Ola Spurr on 2/11. We have ordered x-rays for her bilateral feet; he was advised to obtain the x-rays today or tomorrow. He will follow-up next week 05/23/17-he is here in follow-up evaluation for multiple ulcerations to multiple toes bilaterally and the right posterior heel. There is improvement in maceration. He has been compliant and not applying any moisturizing agent to his toes. He has been using Lac-Hydrin  for his lower extremities with improvement. He did not go to his appointment on Monday with Dr. Ola Spurr secondary to financial concerns. X-rays for her bilateral feet showed: LEFT FOOT with slight erosion of the tuft of the distal phalanges of the left first and second toe suspicious for osteomyelitis, RIGHT FOOT with 1.erosion of the tufts of the distal phalanges of the right first second and possibly third toes consistent with osteomyelitis, 2 no definitive abnormality of the calcaneus is seen on the images obtained, 3. Plantar calcaneal degenerative spur. We briefly discussed hyperbaric adjunctive therapy for treatment of chronic refractory osteomyelitis. I do not find an a1c in EMR, will contact PCP for record, or order if needed. He has been encouraged to contact Dr Ola Spurr office regarding the follow-up appointment, encouraged him to inquire about payment plan. We will continue with same treatment plan and follow-up next week. He states he is still taking antibiotics and has "a lot" left. He states he has been taking them as directed, 2 pills twice daily. According to Dr. Blane Ohara office notes he was originally started on 12/17 for 4 weeks and extended on 1/14 for an additional 4 weeks. He should be done with his antibiotic therapy, he was advised to bring his bottles and to his next appointment, we will contact pharmacy. 05/30/17-he is here in follow-up evaluation  for multiple ulcerations to multiple toes bilaterally and the right posterior heel. He is accompanied by his brother-in-law. Wounds are stable. He has yet to make up with Dr. Ola Spurr. We contacted his PCP, with no record of recent A1c we will draw an A1c. His brother-in-law states that he was taken off all of his diabetic medication secondary to kidney function. 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 The antibiotics were counted and have approximately 2 weeks left, although they should be complete. The pill organizer reveals missing days. We discussed the need for consistent medications, to have optimal benefit of medication. He has a cousin that lives with him and he will ask her to check his organizer daily. He has been advised to follow up with Dr Ola Spurr, and will go by the office today. He has been advised to quit smoking. 06/06/17-he is here in follow up evaluation. He has had to make an appointment with Dr. Ola Spurr. He did have blood work obtained, a1c 5. He continues to take antibiotic therapy. Significant improvement in bilateral lower extremity edema with compression therapy. Essentially no change in ulcerations to toes. He states he is "going to try something different" and "let me know next week" if it works; he would not provide any additional information and was encouraged to follow our orders. We will follow up next week Electronic Signature(s) Signed: 06/06/2017 12:01:18 PM By: Lawanda Cousins Previous Signature: 06/06/2017 8:21:53 AM Version By: Lawanda Cousins Entered By: Lawanda Cousins on 06/06/2017 12:01:17 Terlizzi, [name] ([id number]) -------------------------------------------------------------------------------- Physician Orders Details Patient Name: Hoaglund, Lexus E. Date of Service: 06/06/2017 9:45 AM Medical Record Number: [id number] Patient Account Number:  [id number] Date of Birth/Sex: 03/29/1951 (67 y.o. Male) Treating RN: Ahmed Prima Primary Care Provider: Lamonte Sakai Other Clinician: Referring Provider: Lamonte Sakai Treating Provider/Extender: Cathie Olden in Treatment: 47 Verbal / Phone Orders: Yes Clinician: Carolyne Fiscal, Debi Read Back and Verified: Yes Diagnosis Coding Wound Cleansing Wound #1 Right Toe Great o Clean wound with Normal Saline. o May Shower, gently pat wound dry prior to applying new dressing. Wound #  2 Right Toe Second o Clean wound with Normal Saline. o May Shower, gently pat wound dry prior to applying new dressing. Wound #5 Left Toe Great o Clean wound with Normal Saline. o May Shower, gently pat wound dry prior to applying new dressing. Wound #6 Left Toe Second o Clean wound with Normal Saline. o May Shower, gently pat wound dry prior to applying new dressing. Wound #9 Right Toe Third o Clean wound with Normal Saline. o May Shower, gently pat wound dry prior to applying new dressing. Anesthetic (add to Medication List) Wound #1 Right Toe Great o Topical Lidocaine 4% cream applied to wound bed prior to debridement (In Clinic Only). Wound #2 Right Toe Second o Topical Lidocaine 4% cream applied to wound bed prior to debridement (In Clinic Only). Wound #5 Left Toe Great o Topical Lidocaine 4% cream applied to wound bed prior to debridement (In Clinic Only). Wound #6 Left Toe Second o Topical Lidocaine 4% cream applied to wound bed prior to debridement (In Clinic Only). Wound #9 Right Toe Third o Topical Lidocaine 4% cream applied to wound bed prior to debridement (In Clinic Only). Primary Wound Dressing Wound #1 Right Toe Great o Silvercel Non-Adherent Wound #2 Right Toe Second o Silvercel Non-Adherent Wound #5 Left Toe Great Laux, Saige E. ([id number]) o Silvercel Non-Adherent Wound #6 Left Toe Second o Silvercel Non-Adherent Wound #9 Right Toe  Third o Silvercel Non-Adherent Secondary Dressing Wound #1 Right Toe Great o ABD and Kerlix/Conform Wound #2 Right Toe Second o ABD and Kerlix/Conform Wound #5 Left Toe Great o ABD and Kerlix/Conform Wound #6 Left Toe Second o ABD and Kerlix/Conform Wound #9 Right Toe Third o ABD and Kerlix/Conform Dressing Change Frequency Wound #1 Right Toe Great o Change dressing every day. Wound #2 Right Toe Second o Change dressing every day. Wound #5 Left Toe Great o Change dressing every day. Wound #6 Left Toe Second o Change dressing every day. Wound #9 Right Toe Third o Change dressing every day. Follow-up Appointments Wound #1 Right Toe Great o Return Appointment in 1 week. Wound #2 Right Toe Second o Return Appointment in 1 week. Wound #5 Left Toe Great o Return Appointment in 1 week. Wound #6 Left Toe Second o Return Appointment in 1 week. Wound #9 Right Toe Third o Return Appointment in 1 week. CARA, AGUINO ([id number]) Edema Control o 3 Layer Compression System - Bilateral - unna to anchor change weekly Additional Orders / Instructions Wound #1 Right Toe Great o Stop Smoking o Increase protein intake. o Other: - Please add vitamin A, vitamin C and zinc supplements to your diet Wound #2 Right Toe Second o Stop Smoking o Increase protein intake. o Other: - Please add vitamin A, vitamin C and zinc supplements to your diet Wound #5 Left Toe Great o Stop Smoking o Increase protein intake. o Other: - Please add vitamin A, vitamin C and zinc supplements to your diet Wound #6 Left Toe Second o Stop Smoking o Increase protein intake. o Other: - Please add vitamin A, vitamin C and zinc supplements to your diet Wound #9 Right Toe Third o Stop Smoking o Increase protein intake. o Other: - Please add vitamin A, vitamin C and zinc supplements to your diet Patient Medications Allergies: No Known Drug  Allergies Notifications Medication Indication Start End lidocaine DOSE 1 - topical 4 % cream - 1 cream topical Electronic Signature(s) Signed: 06/13/2017 1:53:22 PM By: Lawanda Cousins Entered By: Lawanda Cousins on 06/06/2017 12:01:52  COLLIS, THEDE ([id number]) -------------------------------------------------------------------------------- Prescription 06/06/2017 Patient Name: JOHNNELL, LIOU. Provider: Lawanda Cousins NP Date of Birth: November 07, 1950 NPI#: [id number] Sex: Jerilynn Mages DEA#: [id number] Phone #: [phone] License #: Patient Address: [facility] [place], [place] [address] [address] [place], [place] [address] [phone] Allergies No Known Drug Allergies Medication Medication: Route: Strength: Form: lidocaine 4 % topical cream topical 4% cream Class: TOPICAL LOCAL ANESTHETICS Dose: Frequency / Time: Indication: 1 1 cream topical Number of Refills: Number of Units: 0 Generic Substitution: Start Date: End Date: One Time Use: Substitution Permitted No Note to Pharmacy: Signature(s): Date(s): Electronic Signature(s) Signed: 06/13/2017 1:53:22 PM By: Lawanda Cousins Entered By: Lawanda Cousins on 06/06/2017 12:01:53 Cornick, [name] ([id number]) --------------------------------------------------------------------------------  Problem List Details Patient Name: Levier, Vernard E. Date of Service: 06/06/2017 9:45 AM Medical Record Number: [id number] Patient Account Number: [id number] Date of Birth/Sex: 10-Mar-1951 (67 y.o. Male) Treating RN: Ahmed Prima Primary Care Provider: Lamonte Sakai Other Clinician: Referring Provider: Lamonte Sakai Treating Provider/Extender: Cathie Olden in Treatment: 41 Active Problems ICD-10 Encounter Code Description Active Date Diagnosis E11.621 Type 2 diabetes mellitus with foot ulcer 09/21/2016 Yes I87.313 Chronic venous hypertension  (idiopathic) with ulcer of bilateral 09/21/2016 Yes lower extremity I89.0 Lymphedema, not elsewhere classified 09/21/2016 Yes L97.522 Non-pressure chronic ulcer of other part of left foot with fat layer 09/21/2016 Yes exposed L97.512 Non-pressure chronic ulcer of other part of right foot with fat layer 09/21/2016 Yes exposed F17.218 Nicotine dependence, cigarettes, with other nicotine-induced 09/21/2016 Yes disorders F10.19 Alcohol abuse with unspecified alcohol-induced disorder 09/21/2016 Yes M86.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, right ankle and foot 02/21/2017 Yes Inactive Problems Resolved Problems Electronic Signature(s) Signed: 06/06/2017 11:58:36 AM By: Lawanda Cousins Entered By: Lawanda Cousins on 06/06/2017 11:58:36 Broxterman, [name] ([id number]) -------------------------------------------------------------------------------- Progress Note Details Patient Name: Flanary, Nader E. Date of Service: 06/06/2017 9:45 AM Medical Record Number: [id number] Patient Account Number: [id number] Date of Birth/Sex: 09/26/1950 (67 y.o. Male) Treating RN: Ahmed Prima Primary Care Provider: Lamonte Sakai Other Clinician: Referring Provider: Lamonte Sakai Treating Provider/Extender: Cathie Olden in Treatment: 73 Subjective Chief Complaint Information obtained from Patient Patient presents for treatment of an open diabetic ulcer to both feet History of Present Illness (HPI) The following HPI elements were documented for the patient's wound: Location: bilateral feet ulceration on the toes Quality: Patient reports experiencing a dull pain to affected area(s). Severity: Patient states wound are getting better Duration: Patient has had the wound for > 3 months prior to seeking treatment at the wound center Timing: Pain in wound is constant (hurts all the time) Context: The wound would happen gradually Modifying Factors: Other treatment(s) tried include:treatment for lymphedema and is seen by the  podiatrist Dr. Caryl Comes Associated Signs and Symptoms: Patient reports having increase swelling. 67 year old patient here to see as for bilateral feet ulceration to on his left first and second toe and 2 on his right first and second toe, which she's had for about 4 months. 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 Donella Stade. The patient is also undergoing workup by medical oncology for a intra-abdominal lymphadenopathy. 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 He is also status post appendectomy, inguinal lymph node biopsy, rectal biopsy and  rectal examination under anesthesia. he currently smokes cigarettes about half packet a day. In March of this year he was seen by Dr. Hortencia Pilar, for evaluation of bilateral varicose veins and besides wearing compression stockings he had recommended laser ablation of the right and left great saphenous veins to eleviate the symptoms and complications of severe superficial venous reflux disease. He also recommended lymphedema pumps for better control of his lymphedema. The patient recently has had on 08/23/2016, right greater saphenous vein ablation with the laser energy Earlier lower extremity venous reflux examination done on 05/08/2016 showed no DVT or SVT both lower legs but incompetence of bilateral great saphenous veins was present. A lower arterial study was also done and there was no significant right lower and left lower extremity problems based on a normal toe brachial index bilaterally and the ABI was 1.21 the left and 1.23 on the right. His post ablation venous duplex examination showed successful ablation of the right GS vein with thrombus formation 2 below the right saphenofemoral junction. The  deep system was patent without evidence of thrombosis and this was done on 08/30/2016. 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 Addendum: regarding his x-rays done today and x-ray of the left foot -- IMPRESSION: No objective evidence of osteomyelitis. There are soft tissue changes which may reflect cellulitis. X-ray of the right foot -- IMPRESSION:Findings compatible with cellulitis of the toes. No objective evidence of osteomyelitis is observed. 10/01/16 on evaluation today patient's wounds appeared to be doing some better. I did review the x-rays as well which showed no evidence of osteomyelitis although there was evidence on x-ray of cellulitis. He fortunately is not having any discomfort Brackens, Kinnie E. ([id number]) although he continues to have some swelling. He does not remember being on any antibiotics recently. 10/15/16 on evaluation today patient's wounds overall appear to be doing better although he does have a new location noted on the left foot. Fortunately he is not having significant pain. It almost has the appearance that something is rubbing on the end of his toes but he wears the open toe shoes and according to what he is telling me never wears anything that would rub on his foot. There is no evidence of infection and specifically no evidence of a fungal infection 10/22/16 On evaluation today patient's wounds appeared to be doing better compared to last week in regard to his bilateral lower extremities. Fortunately I happy with how things are progressing although he still has ulcers I feel like that he is improving and appropriate manner. 11/12/16 on evaluation today patient appears to be doing well in regard to his bilateral feet and the respective wounds. We have been using surrounding her dressings along with an antifungal cream which seems to be doing very well. He has no  bilateral dysfunction noticed that the rituals are weight loss at this point. He also has no nausea or vomiting a note purulent discharge. He did see Vein and vascular today and he tells me that they told him he could have surgery for his venous stasis but they did not feel like it was worth it in his words. Fortunately patient's wounds do appear to be getting sneakily better. 11/26/2016 -- he says he is going to have some surgery during this week at [facility] for possibly a colon resection. 12/31/2016 -- the patient has been noncompliant with his smoking and  I'm not sure whether he is also started drinking again. He continues to be very nonchalant about his care 01/14/2017 -- the patient's HandP has been reviewed well and I understand he is being compliant with trying to give up smoking and his local dressing changes. He does not have any surgical options of 4 to him by his vascular surgeons.he was last seen in early August by Dr. Hortencia Pilar who recommended compression stockings,and possibly lymph pumps in 2-3 months after doing a review ultrasound. 01/28/2017 - the patient did not have any fresh complaints but on examination I noted a large lacerated wound on the plantar aspect of his right fourth toe which had a lot of necrotic debris and it probes down to bone. 02/07/2017 -- x-ray of the right foot -- IMPRESSION: Soft tissue swelling about the first through fourth toes consistent with cellulitis. New destructive change in the tuft of the distal phalanx of the great toe is consistent with osteomyelitis. 02/14/2017 -- the patient's MRI is pending this coming Monday and he still continues to smoke. We have again gone over off loading of his wounds in great detail and he says he's been compliant. 02/21/2017 -- MR of the right foot -- IMPRESSION: 1. Soft tissue ulcer at the tip of the first, second and third toe knows. Cortical irregularity and bone marrow edema in the first distal phalanx  most concerning for osteomyelitis. Mild marrow edema in the second and third distal phalanx without definite cortical destruction which may reflect early osteomyelitis versus reactive marrow edema. 2. Soft tissue edema surrounding the first phalanx most consistent with cellulitis. the patient was also recently evaluated by his medical oncologist Dr. 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 She is treating him with weekly Procrit. She is also keeping intra-abdominal lymphadenopathy and right lower lobe lung nodule under observation. 04/04/2017 -- he was seen by Dr. Adrian Prows on 03/25/2017 -- after review he empirically put him on ciprofloxacin and doxycycline as they have good bone penetration and good bioavailability and it will cover the usual pathogens and diabetic foot osteomyelitis. He will check inflammatory markers and plan a 85-29 week old records. C-reactive protein was 0.3 and the ESR was 72 04/18/17 on evaluation today patient appears to be doing about the same in regard to his lower extremity wounds bilaterally. He has continued to use the antifungal cream which does seem to be beneficial. Nonetheless the ulcers do seem to in some areas be epithelial eyes over and in other areas are still open. He is having no significant discomfort. 04/25/17-he is here in follow-up evaluation for multiple ulcerations to multiple toes bilaterally. He states he did see Dr. Ola Spurr again last week and continues antibiotic therapy. He is voicing no complaints or concerns, will continue with current treatment plan will possibility of adding compression therapy next week after an additional week of treatment/lotions to BLE prescribed by Dr Ola Spurr 05/02/17 he is here in follow up for for multiple ulcers to multiple toes bilaterally. we will stop using antifungal cream and will continue with silvercel and follow up next  week Chandra, Elison E. ([id number]) 05/09/17-he is here in follow-up for multiple ulcerations to multiple toes bilaterally. There is improvement in appearance. He has not completely stopped using antifungal cream, but admits he has not using it between the toes. He has an appointment with Dr. Ola Spurr on 2/11, continues on doxycycline and Cipro. It has been 5 weeks  of antibiotic therapy, we will order plain film xray to evaluate for osteomyelitis next week, prior to follow up with ID. Will continue with silvercel and follow up next week 05/16/17-he is here in follow-up evaluation for multiple ulcerations to multiple toes bilaterally and new wound to the right posterior heel. There is essentially no change in appearance, deteriorating measurements; he has a history of waxing and waning measurements. He admits that he continues to apply moisturizer/cream/ointment to his toes despite weekly reminders to only apply silvercel to his toes. He states that he thinks the surgical shoe contributed to the superficial ulcer to his posterior heel, he is unable to articulate if this was an area of dry cracked skin as he has a similar area to the left heel. He now is wearing open toed slippers. He has an appointment with Dr. Ola Spurr on 2/11. We have ordered x-rays for her bilateral feet; he was advised to obtain the x-rays today or tomorrow. He will follow-up next week 05/23/17-he is here in follow-up evaluation for multiple ulcerations to multiple toes bilaterally and the right posterior heel. There is improvement in maceration. He has been compliant and not applying any moisturizing agent to his toes. He has been using Lac-Hydrin for his lower extremities with improvement. He did not go to his appointment on Monday with Dr. Ola Spurr secondary to financial concerns. X-rays for her bilateral feet showed: LEFT FOOT with slight erosion of the tuft of the distal phalanges of the left first and second toe suspicious  for osteomyelitis, RIGHT FOOT with 1.erosion of the tufts of the distal phalanges of the right first second and possibly third toes consistent with osteomyelitis, 2 no definitive abnormality of the calcaneus is seen on the images obtained, 3. Plantar calcaneal degenerative spur. We briefly discussed hyperbaric adjunctive therapy for treatment of chronic refractory osteomyelitis. I do not find an a1c in EMR, will contact PCP for record, or order if needed. He has been encouraged to contact Dr Ola Spurr office regarding the follow-up appointment, encouraged him to inquire about payment plan. We will continue with same treatment plan and follow-up next week. He states he is still taking antibiotics and has "a lot" left. He states he has been taking them as directed, 2 pills twice daily. According to Dr. Blane Ohara office notes he was originally started on 12/17 for 4 weeks and extended on 1/14 for an additional 4 weeks. He should be done with his antibiotic therapy, he was advised to bring his bottles and to his next appointment, we will contact pharmacy. 05/30/17-he is here in follow-up evaluation for multiple ulcerations to multiple toes bilaterally and the right posterior heel. He is accompanied by his brother-in-law. Wounds are stable. He has yet to make up with Dr. Ola Spurr. We contacted his PCP, with no record of recent A1c we will draw an A1c. His brother-in-law states that he was taken off all of his diabetic medication secondary to kidney function. 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 The antibiotics were counted and have approximately 2 weeks left, although they should be complete. The pill organizer reveals missing days. We discussed the need for consistent medications, to have optimal benefit of medication. He has a cousin that lives with him and he will ask her to check his organizer  daily. He has been advised to follow up with Dr Ola Spurr, and will go by the office today. He has been advised to  quit smoking. 06/06/17-he is here in follow up evaluation. He has had to make an appointment with Dr. Ola Spurr. He did have blood work obtained, a1c 5. He continues to take antibiotic therapy. Significant improvement in bilateral lower extremity edema with compression therapy. Essentially no change in ulcerations to toes. He states he is "going to try something different" and "let me know next week" if it works; he would not provide any additional information and was encouraged to follow our orders. We will follow up next week Patient History Information obtained from Patient. Social History Current every day smoker, Marital Status - Widowed, Alcohol Use - Daily - quit drinking about a week ago, Drug Use - No History, Caffeine Use - Moderate. Medical And Surgical History Notes Oncologic 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 Friese, Macintyre E. ([id number]) Objective Constitutional Vitals Time Taken: 10:19 AM, Height: 69 in, Weight: 168 lbs, BMI: 24.8, Temperature: 98.0 F, Pulse: 76 bpm, Respiratory Rate: 16 breaths/min, Blood Pressure: 148/76 mmHg. Integumentary (Hair, Skin) Wound #1 status is Open. Original cause of wound was Gradually Appeared. The wound is located on the Right Toe Great. The wound measures 2.5cm length x 4cm width x 0.1cm depth; 7.854cm^2 area and 0.785cm^3 volume. There is Fat Layer (Subcutaneous Tissue) Exposed exposed. There is no tunneling or undermining noted. There is a large amount of serosanguineous drainage noted. The wound margin is flat and intact. There is large (67-100%) red granulation within the wound bed. There is a small (1-33%) amount of necrotic tissue within the wound bed including Eschar and Adherent Slough. The periwound skin appearance exhibited: Callus, Excoriation, Maceration. The periwound skin appearance did  not exhibit: Crepitus, Induration, Rash, Scarring, Dry/Scaly, Atrophie Blanche, Cyanosis, Ecchymosis, Hemosiderin Staining, Mottled, Pallor, Rubor, Erythema. Periwound temperature was noted as No Abnormality. Wound #11 status is Healed - Epithelialized. Original cause of wound was Gradually Appeared. The wound is located on the Right Calcaneus. The wound measures 0cm length x 0cm width x 0cm depth; 0cm^2 area and 0cm^3 volume. Wound #2 status is Open. Original cause of wound was Gradually Appeared. The wound is located on the Right Toe Second. The wound measures 0.6cm length x 1.5cm width x 0.1cm depth; 0.707cm^2 area and 0.071cm^3 volume. There is Fat Layer (Subcutaneous Tissue) Exposed exposed. There is no tunneling or undermining noted. There is a large amount of serosanguineous drainage noted. The wound margin is flat and intact. There is large (67-100%) red granulation within the wound bed. There is a small (1-33%) amount of necrotic tissue within the wound bed including Adherent Slough. The periwound skin appearance exhibited: Scarring, Maceration. The periwound skin appearance did not exhibit: Callus, Crepitus, Excoriation, Induration, Rash, Dry/Scaly, Atrophie Blanche, Cyanosis, Ecchymosis, Hemosiderin Staining, Mottled, Pallor, Rubor, Erythema. Periwound temperature was noted as No Abnormality. Wound #5 status is Open. Original cause of wound was Gradually Appeared. The wound is located on the Left Toe Great. The wound measures 1cm length x 2.3cm width x 0.1cm depth; 1.806cm^2 area and 0.181cm^3 volume. There is Fat Layer (Subcutaneous Tissue) Exposed exposed. There is no tunneling or undermining noted. There is a large amount of serosanguineous drainage noted. Foul odor after cleansing was noted. The wound margin is flat and intact. There is medium (34-66%) red granulation within the wound bed. There is a small (1-33%) amount of necrotic tissue within the wound bed including Adherent  Slough. The periwound skin appearance exhibited: Maceration. The periwound skin appearance did not exhibit: Callus, Crepitus, Excoriation, Induration, Rash, Scarring, Dry/Scaly,  Atrophie Blanche, Cyanosis, Ecchymosis, Hemosiderin Staining, Mottled, Pallor, Rubor, Erythema. Periwound temperature was noted as No Abnormality. Wound #6 status is Open. Original cause of wound was Gradually Appeared. The wound is located on the Left Toe Second. The wound measures 1.1cm length x 0.8cm width x 0.1cm depth; 0.691cm^2 area and 0.069cm^3 volume. There is Fat Layer (Subcutaneous Tissue) Exposed exposed. There is no tunneling or undermining noted. There is a large amount of serosanguineous drainage noted. The wound margin is flat and intact. There is large (67-100%) red granulation within the wound bed. There is a small (1-33%) amount of necrotic tissue within the wound bed including Adherent Slough. The periwound skin appearance exhibited: Maceration. The periwound skin appearance did not exhibit: Callus, Crepitus, Excoriation, Induration, Rash, Scarring, Dry/Scaly, Atrophie Blanche, Cyanosis, Ecchymosis, Hemosiderin Staining, Mottled, Pallor, Rubor, Erythema. Periwound temperature was noted as No Abnormality. Wound #9 status is Open. Original cause of wound was Gradually Appeared. The wound is located on the Right Toe Third. The wound measures 0.4cm length x 0.5cm width x 0.1cm depth; 0.157cm^2 area and 0.016cm^3 volume. There is Fat Layer (Subcutaneous Tissue) Exposed exposed. There is no tunneling or undermining noted. There is a small amount of serous drainage noted. The wound margin is flat and intact. There is large (67-100%) red granulation within the wound bed. There is a small (1-33%) amount of necrotic tissue within the wound bed including Adherent Slough. The periwound skin appearance did not exhibit: Callus, Crepitus, Excoriation, Induration, Rash, Scarring, Dry/Scaly, Maceration, Atrophie Blanche,  Cyanosis, Ecchymosis, Hemosiderin Staining, Mottled, Pallor, Rubor, Erythema. Periwound temperature was noted as No Abnormality. JAYLYNN, SIEFERT ([id number]) Assessment Active Problems ICD-10 E11.621 - Type 2 diabetes mellitus with foot ulcer I87.313 - Chronic venous hypertension (idiopathic) with ulcer of bilateral lower extremity I89.0 - Lymphedema, not elsewhere classified L97.522 - Non-pressure chronic ulcer of other part of left foot with fat layer exposed L97.512 - Non-pressure chronic ulcer of other part of right foot with fat layer exposed F17.218 - Nicotine dependence, cigarettes, with other nicotine-induced disorders F10.19 - Alcohol abuse 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 - Chronic multifocal osteomyelitis, right ankle and foot Procedures Wound #1 Pre-procedure diagnosis of Wound #1 is a Diabetic Wound/Ulcer of the Lower Extremity located on the Right Toe Great .Severity of Tissue Pre Debridement is: Fat layer exposed. There was a Skin/Subcutaneous Tissue Debridement ([id number]) debridement with total area of 10 sq cm performed by Lawanda Cousins, NP. with the following instrument(s): Curette including Exudate, Fibrin/Slough, and Subcutaneous after achieving pain control using Lidocaine 4% Topical Solution. A time out was conducted at 10:35, prior to the start of the procedure. A Minimum amount of bleeding was controlled with Pressure. The procedure was tolerated well with a pain level of 0 throughout and a pain level of 0 following the procedure. Post Debridement Measurements: 2.5cm length x 4cm width x 0.2cm depth; 1.571cm^3 volume. Character of Wound/Ulcer Post Debridement requires further debridement. Severity of Tissue Post Debridement is: Fat layer exposed. Post procedure Diagnosis Wound #1: Same as Pre-Procedure Wound #2 Pre-procedure diagnosis of Wound #2 is a Diabetic Wound/Ulcer of the Lower Extremity located on the Right Toe Second .Severity of Tissue  Pre Debridement is: Fat layer exposed. There was a Skin/Subcutaneous Tissue Debridement ([id number]) debridement with total area of 0.9 sq cm performed by Lawanda Cousins, NP. with the following instrument(s): Curette including Exudate, Fibrin/Slough, and Subcutaneous after achieving pain control using Lidocaine 4% Topical Solution. A time out was conducted at 10:35, prior to the start of  the procedure. A Minimum amount of bleeding was controlled with Pressure. The procedure was tolerated well with a pain level of 0 throughout and a pain level of 0 following the procedure. Post Debridement Measurements: 0.6cm length x 1.5cm width x 0.2cm depth; 0.141cm^3 volume. Character of Wound/Ulcer Post Debridement requires further debridement. Severity of Tissue Post Debridement is: Fat layer exposed. Post procedure Diagnosis Wound #2: Same as Pre-Procedure Wound #6 Pre-procedure diagnosis of Wound #6 is a Diabetic Wound/Ulcer of the Lower Extremity located on the Left Toe Second .Severity of Tissue Pre Debridement is: Fat layer exposed. There was a Skin/Subcutaneous Tissue Debridement ([id number]) debridement with total area of 0.88 sq cm performed by Lawanda Cousins, NP. with the following instrument(s): Curette to remove Viable and Non-Viable tissue/material including Exudate, Fibrin/Slough, and Subcutaneous after achieving pain control using Lidocaine 4% Topical Solution. A time out was conducted at 10:35, prior to the start of the procedure. A Minimum amount of bleeding was controlled with Pressure. The procedure was tolerated well with a pain level of 0 throughout and a pain level of 0 following the procedure. Post Debridement Measurements: 1.1cm length x 0.8cm width x 0.2cm depth; 0.138cm^3 volume. Character of Wound/Ulcer Post Debridement requires further debridement. Severity of Tissue Post Debridement is: Fat layer exposed. LADARRIOUS, KIRKSEY ([id number]) Post procedure Diagnosis Wound #6: Same as  Pre-Procedure Plan Wound Cleansing: Wound #1 Right Toe Great: Clean wound with Normal Saline. May Shower, gently pat wound dry prior to applying new dressing. Wound #2 Right Toe Second: Clean wound with Normal Saline. May Shower, gently pat wound dry prior to applying new dressing. Wound #5 Left Toe Great: Clean wound with Normal Saline. May Shower, gently pat wound dry prior to applying new dressing. Wound #6 Left Toe Second: Clean wound with Normal Saline. May Shower, gently pat wound dry prior to applying new dressing. Wound #9 Right Toe Third: Clean wound with Normal Saline. May Shower, gently pat wound dry prior to applying new dressing. Anesthetic (add to Medication List): Wound #1 Right Toe Great: Topical Lidocaine 4% cream applied to wound bed prior to debridement (In Clinic Only). Wound #2 Right Toe Second: Topical Lidocaine 4% cream applied to wound bed prior to debridement (In Clinic Only). Wound #5 Left Toe Great: Topical Lidocaine 4% cream applied to wound bed prior to debridement (In Clinic Only). Wound #6 Left Toe Second: Topical Lidocaine 4% cream applied to wound bed prior to debridement (In Clinic Only). Wound #9 Right Toe Third: Topical Lidocaine 4% cream applied to wound bed prior to debridement (In Clinic Only). Primary Wound Dressing: Wound #1 Right Toe Great: Silvercel Non-Adherent Wound #2 Right Toe Second: Silvercel Non-Adherent Wound #5 Left Toe Great: Silvercel Non-Adherent Wound #6 Left Toe Second: Silvercel Non-Adherent Wound #9 Right Toe Third: Silvercel Non-Adherent Secondary Dressing: Wound #1 Right Toe Great: ABD and Kerlix/Conform Wound #2 Right Toe Second: ABD and Kerlix/Conform Wound #5 Left Toe Great: ABD and Kerlix/Conform Wound #6 Left Toe Second: ABD and Kerlix/Conform Wound #9 Right Toe Third: ABD and Kerlix/Conform Dressing Change Frequency: Gust, Shawn E. ([id number]) Wound #1 Right Toe Great: Change dressing every  day. Wound #2 Right Toe Second: Change dressing every day. Wound #5 Left Toe Great: Change dressing every day. Wound #6 Left Toe Second: Change dressing every day. Wound #9 Right Toe Third: Change dressing every day. Follow-up Appointments: Wound #1 Right Toe Great: Return Appointment in 1 week. Wound #2 Right Toe Second: Return Appointment in 1 week. Wound #5 Left Toe Great:  Return Appointment in 1 week. Wound #6 Left Toe Second: Return Appointment in 1 week. Wound #9 Right Toe Third: Return Appointment in 1 week. Edema Control: 3 Layer Compression System - Bilateral - unna to anchor change weekly Additional Orders / Instructions: Wound #1 Right Toe Great: Stop Smoking Increase protein intake. Other: - Please add vitamin A, vitamin C and zinc supplements to your diet Wound #2 Right Toe Second: Stop Smoking Increase protein intake. Other: - Please add vitamin A, vitamin C and zinc supplements to your diet Wound #5 Left Toe Great: Stop Smoking Increase protein intake. Other: - Please add vitamin A, vitamin C and zinc supplements to your diet Wound #6 Left Toe Second: Stop Smoking Increase protein intake. Other: - Please add vitamin A, vitamin C and zinc supplements to your diet Wound #9 Right Toe Third: Stop Smoking Increase protein intake. Other: - Please add vitamin A, vitamin C and zinc supplements to your diet The following medication(s) was prescribed: lidocaine topical 4 % cream 1 1 cream topical was prescribed at facility 1. Continue with silvercel 2. Establish appointment with Dr. Ola Spurr 3. Follow-up next week EISEN, ROBENSON ([id number]) Electronic Signature(s) Signed: 06/06/2017 12:02:32 PM By: Lawanda Cousins Entered By: Lawanda Cousins on 06/06/2017 12:02:32 Delehanty, [name] ([id number]) -------------------------------------------------------------------------------- ROS/PFSH Details Patient Name: Hackley, Demondre E. Date of Service: 06/06/2017 9:45  AM Medical Record Number: [id number] Patient Account Number: [id number] Date of Birth/Sex: 12-10-1950 (67 y.o. Male) Treating RN: Ahmed Prima Primary Care Provider: Lamonte Sakai Other Clinician: Referring Provider: Lamonte Sakai Treating Provider/Extender: Cathie Olden in Treatment: 38 Information Obtained From Patient Wound History Do you currently have one or more open woundso Yes How many open wounds do you currently haveo 6 Approximately how long have you had your woundso 3 months How have you been treating your wound(s) until nowo ointment and bandage Has your wound(s) ever healed and then re-openedo No Have you had any lab work done in the past montho No Have you tested positive for an antibiotic resistant organism (MRSA, VRE)o No Have you tested positive for osteomyelitis (bone infection)o No Have you had any tests for circulation on your legso Yes Who ordered the testo PCP Where was the test doneo [facility] Medical History: Negative for: Cataracts; Glaucoma; Optic Neuritis Ear/Nose/Mouth/Throat Medical History: Negative for: Chronic sinus problems/congestion; Middle ear problems Hematologic/Lymphatic Medical History: Positive for: Anemia; Lymphedema Negative for: Hemophilia; Human Immunodeficiency Virus; Sickle Cell Disease Respiratory Medical History: Negative for: Aspiration; Asthma; Chronic Obstructive Pulmonary Disease (COPD); Pneumothorax; Sleep Apnea; Tuberculosis Cardiovascular Medical History: Positive for: Congestive Heart Failure; Hypertension; Peripheral Venous Disease Negative for: Angina; Arrhythmia; Coronary Artery Disease; Deep Vein Thrombosis; Hypotension; Myocardial Infarction; Peripheral Arterial Disease; Phlebitis; Vasculitis Gastrointestinal Medical History: Negative for: Cirrhosis ; Colitis; Crohnos; Hepatitis A; Hepatitis B; Hepatitis C Fitzgibbon, Macen E. ([id number]) Endocrine Medical History: Positive for: Type II Diabetes Treated  with: Oral agents Blood sugar tested every day: Yes Tested : QD Genitourinary Medical History: Negative for: End Stage Renal Disease Immunological Medical History: Negative for: Lupus Erythematosus; Raynaudos; Scleroderma Integumentary (Skin) Medical History: Negative for: History of Burn; History of pressure wounds Musculoskeletal Medical History: Negative for: Gout; Rheumatoid Arthritis; Osteoarthritis; Osteomyelitis Neurologic Medical History: Positive for: Neuropathy Negative for: Dementia; Quadriplegia; Paraplegia; Seizure Disorder Oncologic Medical History: Past Medical History Notes: 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 Electronic Signature(s) Signed: 06/06/2017 4:59:29 PM By: Alric Quan Signed: 06/13/2017 1:53:22 PM By: Stanton Kidney ([id number]) Entered By: Lawanda Cousins on 06/06/2017 12:01:28 Laur, [name] ([id number]) -------------------------------------------------------------------------------- SuperBill Details Patient Name: Timoney, Stockton E. Date of Service: 06/06/2017 Medical Record Number: [id number] Patient Account Number: [id number] Date of Birth/Sex: [birth year] (67 y.o. 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.512 Non-pressure chronic ulcer of other part of right foot with fat layer exposed F17.218 Nicotine dependence, cigarettes, 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 abuse 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, right ankle and foot Facility Procedures CPT4 Code: 86168372 Description: 90211 - DEB SUBQ TISSUE 20 SQ CM/< ICD-10 Diagnosis Description E11.621 Type 2 diabetes mellitus with foot ulcer L97.522 Non-pressure chronic ulcer of other part of left foot with fat L97.512 Non-pressure chronic ulcer of other part of right  foot with fat M86.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, right ankle and foot Modifier: layer exposed layer exposed Quantity: 1 Physician Procedures CPT4 Code: 1552080 Description: 22336 - WC PHYS SUBQ TISS 20 SQ CM ICD-10 Diagnosis Description E11.621 Type 2 diabetes mellitus with foot ulcer L97.522 Non-pressure chronic ulcer of other part of left foot with fat L97.512 Non-pressure chronic ulcer of other part of right  foot with fat M86.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, right ankle and foot Modifier: layer exposed layer exposed Quantity: 1 Electronic Signature(s) Signed: 06/06/2017 12:02:58 PM By: Lawanda Cousins Entered By: Lawanda Cousins on 06/06/2017 12:02:58

## 2017-06-09 NOTE — Progress Notes (Signed)
OLANDA, DOWNIE ([id number]) Visit Report for 06/06/2017 Arrival Information Details Patient Name: EWIN, REHBERG. Date of Service: 06/06/2017 9:45 AM Medical Record Number: [id number] Patient Account Number: [id number] Date of Birth/Sex: 1950-10-14 (67 y.o. Male) Treating RN: Montey Hora Primary Care Jarita Raval: Lamonte Sakai Other Clinician: Referring Veryl Winemiller: Myrtie Hawk Treating Jamaris Theard/Extender: Cathie Olden in Treatment: 67 Visit Information History Since Last Visit Added or deleted any medications: No Patient Arrived: Ambulatory Any new allergies or adverse reactions: No Arrival Time: 10:17 Had a fall or experienced change in No Accompanied By: brother activities of daily living that may affect Transfer Assistance: None risk of falls: Patient Identification Verified: Yes Signs or symptoms of abuse/neglect since last visito No Secondary Verification Process Completed: Yes Hospitalized since last visit: No Patient Requires Transmission-Based No Has Dressing in Place as Prescribed: Yes Precautions: Pain Present Now: No Patient Has Alerts: Yes Patient Alerts: DMII Electronic Signature(s) Signed: 06/06/2017 4:10:15 PM By: Montey Hora Entered By: Montey Hora on 06/06/2017 10:19:20 Jennings, [name] ([id number]) -------------------------------------------------------------------------------- Encounter Discharge Information Details Patient Name: Garrette, Jerritt E. Date of Service: 06/06/2017 9:45 AM Medical Record Number: [id number] Patient Account Number: [id number] Date of Birth/Sex: [birth year] (66 y.o. Male) Treating RN: Roger Shelter Primary Care Ericka Marcellus: Lamonte Sakai Other Clinician: Referring Kayleann Mccaffery: Myrtie Hawk Treating Godson Pollan/Extender: Cathie Olden in Treatment: 45 Encounter Discharge Information Items Discharge Pain Level: 0 Discharge Condition: Stable Ambulatory Status: Ambulatory Discharge Destination:  Home Private Transportation: Auto Accompanied By: self Schedule Follow-up Appointment: Yes Medication Reconciliation completed and provided No to Patient/Care Claud Gowan: Clinical Summary of Care: Electronic Signature(s) Signed: 06/07/2017 5:56:09 PM By: Roger Shelter Entered By: Roger Shelter on 06/06/2017 11:09:08 Menning, [name] ([id number]) -------------------------------------------------------------------------------- Lower Extremity Assessment Details Patient Name: Orihuela, Carold E. Date of Service: 06/06/2017 9:45 AM Medical Record Number: [id number] Patient Account Number: [id number] Date of Birth/Sex: 10/06/1950 (67 y.o. Male) Treating RN: Montey Hora Primary Care Kalicia Dufresne: Lamonte Sakai Other Clinician: Referring Chike Farrington: Myrtie Hawk Treating Tasmia Blumer/Extender: Cathie Olden in Treatment: 36 Edema Assessment Assessed: [Left: No] [Right: No] [Left: Edema] [Right: :] Calf Left: Right: Point of Measurement: 34 cm From Medial Instep 34.4 cm 35.2 cm Ankle Left: Right: Point of Measurement: 12 cm From Medial Instep 26.1 cm 25 cm Vascular Assessment Pulses: Dorsalis Pedis Palpable: [Left:Yes] [Right:Yes] Posterior Tibial Extremity colors, hair growth, and conditions: Extremity Color: [Left:Hyperpigmented] [Right:Hyperpigmented] Hair Growth on Extremity: [Left:No] [Right:No] Temperature of Extremity: [Left:Warm] [Right:Warm] Capillary Refill: [Left:< 3 seconds] [Right:< 3 seconds] Electronic Signature(s) Signed: 06/06/2017 4:10:15 PM By: Montey Hora Entered By: Montey Hora on 06/06/2017 10:23:21 Raulerson, Joanna E. ([id number]) -------------------------------------------------------------------------------- Multi Wound Chart Details Patient Name: Velis, Olamide E. Date of Service: 06/06/2017 9:45 AM Medical Record Number: [id number] Patient Account Number: [id number] Date of Birth/Sex: [birth year] (67 y.o. Male) Treating RN: Ahmed Prima Primary  Care Tanda Morrissey: Lamonte Sakai Other Clinician: Referring Jazman Reuter: Myrtie Hawk Treating Satoya Feeley/Extender: Cathie Olden in Treatment: 36 Vital Signs Height(in): 9 Pulse(bpm): 91 Weight(lbs): 168 Blood Pressure(mmHg): 148/76 Body Mass Index(BMI): 25 Temperature(F): 98.0 Respiratory Rate 16 (breaths/min): Photos: Wound Location: Right Toe Great Right Calcaneus Right Toe Second Wounding Event: Gradually Appeared Gradually Appeared Gradually Appeared Primary Etiology: Diabetic Wound/Ulcer of 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 Lower Extremity Lower Extremity Comorbid History: Anemia, Lymphedema, N/A Anemia, Lymphedema, Congestive Heart Failure, Congestive Heart Failure, Hypertension, Peripheral Hypertension, Peripheral Venous Disease, Type II Venous Disease, Type II Diabetes, Neuropathy Diabetes, Neuropathy Date Acquired: 06/11/2016 05/09/2017 06/11/2016 Weeks of Treatment: 36 3 36 Wound  Status: Open Healed - Epithelialized Open Pending Amputation on Yes No Yes Presentation: Measurements L x W x D 2.5x4x0.1 0x0x0 0.6x1.5x0.1 (cm) Area (cm) : 7.854 0 0.707 Volume (cm) : 0.785 0 0.071 % Reduction in Area: 83.30% 100.00% 77.00% % Reduction in Volume: 83.30% 100.00% 76.90% Classification: Grade 2 Grade 2 Grade 2 Holstein, Chadley E. ([id number]) Exudate Amount: Large N/A Large Exudate Type: Serosanguineous N/A Serosanguineous Exudate Color: red, brown N/A red, brown Foul Odor After Cleansing: No N/A No Odor Anticipated Due to N/A N/A N/A Product Use: Wound Margin: Flat and Intact N/A Flat and Intact Granulation Amount: Large (67-100%) N/A Large (67-100%) Granulation Quality: Red N/A Red Necrotic Amount: Small (1-33%) N/A Small (1-33%) Necrotic Tissue: Eschar, Adherent [place] Exposed Structures: Fat Layer (Subcutaneous N/A Fat Layer (Subcutaneous Tissue) Exposed: Yes Tissue) Exposed: Yes Fascia:  No Fascia: No Tendon: No Tendon: No Muscle: No Muscle: No Joint: No Joint: No Bone: No Bone: No Epithelialization: Small (1-33%) N/A Medium (34-66%) Debridement: Debridement ([id number]) N/A Debridement ([id number]) Pre-procedure 10:35 N/A 10:35 Verification/Time Out Taken: Pain Control: Lidocaine 4% Topical Solution N/A Lidocaine 4% Topical Solution Tissue Debrided: Fibrin/Slough, Exudates, N/A Fibrin/Slough, Exudates, Subcutaneous Subcutaneous Level: Skin/Subcutaneous Tissue N/A Skin/Subcutaneous Tissue Debridement Area (sq cm): 10 N/A 0.9 Instrument: Curette N/A Curette Bleeding: Minimum N/A Minimum Hemostasis Achieved: Pressure N/A Pressure Procedural Pain: 0 N/A 0 Post Procedural Pain: 0 N/A 0 Debridement Treatment Procedure was tolerated well N/A Procedure was tolerated well Response: Post Debridement 2.5x4x0.2 N/A 0.6x1.5x0.2 Measurements L x W x D (cm) Post Debridement Volume: 1.571 N/A 0.141 (cm) Periwound Skin Texture: Excoriation: Yes No Abnormalities Noted Scarring: Yes Callus: Yes Excoriation: No Induration: No Induration: No Crepitus: No Callus: No Rash: No Crepitus: No Scarring: No Rash: No Periwound Skin Moisture: Maceration: Yes No Abnormalities Noted Maceration: Yes Dry/Scaly: No Dry/Scaly: No Periwound Skin Color: Atrophie Blanche: No No Abnormalities Noted Atrophie Blanche: No Cyanosis: No Cyanosis: No Ecchymosis: No Ecchymosis: No Erythema: No Erythema: No Hemosiderin Staining: No Hemosiderin Staining: No Mottled: No Mottled: No Pallor: No Pallor: No Rubor: No Rubor: No Temperature: No Abnormality N/A No Abnormality Tenderness on Palpation: No No No Wound Preparation: N/A Antonson, Glenard E. ([id number]) Ulcer Cleansing: Ulcer Cleansing: Rinsed/Irrigated with Saline Rinsed/Irrigated with Saline Topical Anesthetic Applied: Topical Anesthetic Applied: Other: lidocaine 4% Other: lidocaine 4% Procedures Performed: Debridement  N/A Debridement Wound Number: [id number]  Photos: Wound Location: Left Toe Great Left Toe Second Right Toe Third Wounding Event: Gradually Appeared Gradually Appeared Gradually Appeared Primary Etiology: Diabetic Wound/Ulcer of 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 Lower Extremity Lower Extremity Comorbid History: Anemia, Lymphedema, Anemia, Lymphedema, Anemia, Lymphedema, Congestive Heart Failure, Congestive Heart Failure, Congestive Heart Failure, Hypertension, Peripheral Hypertension, Peripheral Hypertension, Peripheral Venous Disease, Type II Venous Disease, Type II Venous Disease, Type II Diabetes, Neuropathy Diabetes, Neuropathy Diabetes, Neuropathy Date Acquired: 06/11/2016 06/11/2016 12/31/2016 Weeks of Treatment: 36 36 22 Wound Status: Open Open Open Pending Amputation on Yes Yes No Presentation: Measurements L x W x D 1x2.3x0.1 1.1x0.8x0.1 0.4x0.5x0.1 (cm) Area (cm) : 1.806 0.691 0.157 Volume (cm) : 0.181 0.069 0.016 % Reduction in Area: 87.40% 67.60% 60.10% % Reduction in Volume: 87.30% 67.80% 59.00% Classification: Grade 2 Grade 2 Grade 2 Exudate Amount: Large Large Small Exudate Type: Serosanguineous Serosanguineous Serous Exudate Color: red, brown red, brown amber Foul Odor After Cleansing: Yes No No Odor Anticipated Due to No N/A N/A Product Use: Wound Margin: Flat and Intact Flat and Intact Flat and  Intact Granulation Amount: Medium (34-66%) Large (67-100%) Large (67-100%) Granulation Quality: Red Red Red Necrotic Amount: Small (1-33%) Small (1-33%) Small (1-33%) Necrotic Tissue: Adherent [place] Exposed Structures: Fat Layer (Subcutaneous Fat Layer (Subcutaneous Fat Layer (Subcutaneous Tissue) Exposed: Yes Tissue) Exposed: Yes Tissue) Exposed: Yes Fascia: No Fascia: No Fascia: No Tendon: No Tendon: No Tendon: No Muscle: No Muscle: No Muscle: No Joint: No Joint: No Joint: No Bone:  No Bone: No Bone: No Epithelialization: Small (1-33%) Small (1-33%) None Debridement: N/A Debridement ([id number]) N/A Okuda, Brexton E. ([id number]) Pre-procedure N/A 10:35 N/A Verification/Time Out Taken: Pain Control: N/A Lidocaine 4% Topical Solution N/A Tissue Debrided: N/A Fibrin/Slough, Exudates, N/A Subcutaneous Level: N/A Skin/Subcutaneous Tissue N/A Debridement Area (sq cm): N/A 0.88 N/A Instrument: N/A Curette N/A Bleeding: N/A Minimum N/A Hemostasis Achieved: N/A Pressure N/A Procedural Pain: N/A 0 N/A Post Procedural Pain: N/A 0 N/A Debridement Treatment N/A Procedure was tolerated well N/A Response: Post Debridement N/A 1.1x0.8x0.2 N/A Measurements L x W x D (cm) Post Debridement Volume: N/A 0.138 N/A (cm) Periwound Skin Texture: Excoriation: No Excoriation: No Excoriation: No Induration: No Induration: No Induration: No Callus: No Callus: No Callus: No Crepitus: No Crepitus: No Crepitus: No Rash: No Rash: No Rash: No Scarring: No Scarring: No Scarring: No Periwound Skin Moisture: Maceration: Yes Maceration: Yes Maceration: No Dry/Scaly: No Dry/Scaly: No Dry/Scaly: No Periwound Skin Color: Atrophie Blanche: No Atrophie Blanche: No Atrophie Blanche: No Cyanosis: No Cyanosis: No Cyanosis: No Ecchymosis: No Ecchymosis: No Ecchymosis: No Erythema: No Erythema: No Erythema: No Hemosiderin Staining: No Hemosiderin Staining: No Hemosiderin Staining: No Mottled: No Mottled: No Mottled: No Pallor: No Pallor: No Pallor: No Rubor: No Rubor: No Rubor: No Temperature: No Abnormality No Abnormality No Abnormality Tenderness on Palpation: No No No Wound Preparation: Ulcer Cleansing: Ulcer Cleansing: Ulcer Cleansing: Rinsed/Irrigated with Saline Rinsed/Irrigated with Saline Rinsed/Irrigated with Saline Topical Anesthetic Applied: Topical Anesthetic Applied: Topical Anesthetic Applied: Other: lidocaine 4% Other: lidocaine 4% Other:  lidocaine 4% Procedures Performed: N/A Debridement N/A Treatment Notes Wound #1 (Right Toe Great) 1. Cleansed with: Clean wound with Normal Saline 2. Anesthetic Topical Lidocaine 4% cream to wound bed prior to debridement 4. Dressing Applied: Other dressing (specify in notes) 5. Secondary Dressing Applied ABD Pad 7. Secured with 3 Layer Compression System - Bilateral Carmicheal, Mostafa E. ([id number]) Notes SILVERCELL WITH GAUZE, COVER WITH ABD PAD AND KERLIX TO WRAP TOES. Wound #2 (Right Toe Second) 1. Cleansed with: Clean wound with Normal Saline 2. Anesthetic Topical Lidocaine 4% cream to wound bed prior to debridement 4. Dressing Applied: Other dressing (specify in notes) 5. Secondary Dressing Applied ABD Pad 7. Secured with 3 Layer Compression System - Bilateral Notes SILVERCELL WITH GAUZE, COVER WITH ABD PAD AND KERLIX TO WRAP TOES. Wound #5 (Left Toe Great) 1. Cleansed with: Clean wound with Normal Saline 2. Anesthetic Topical Lidocaine 4% cream to wound bed prior to debridement 4. Dressing Applied: Other dressing (specify in notes) 5. Secondary Dressing Applied ABD Pad 7. Secured with 3 Layer Compression System - Bilateral Notes SILVERCELL WITH GAUZE, COVER WITH ABD PAD AND KERLIX TO WRAP TOES. Wound #6 (Left Toe Second) 1. Cleansed with: Clean wound with Normal Saline 2. Anesthetic Topical Lidocaine 4% cream to wound bed prior to debridement 4. Dressing Applied: Other dressing (specify in notes) 5. Secondary Dressing Applied ABD Pad 7. Secured with 3 Layer Compression System - Bilateral Notes SILVERCELL WITH GAUZE, COVER WITH ABD PAD AND KERLIX TO WRAP TOES. Wound #9 (Right  Toe Third) 1. Cleansed with: Clean wound with Normal Saline 2. Anesthetic Topical Lidocaine 4% cream to wound bed prior to debridement 4. Dressing Applied: Other dressing (specify in notes) Schirmer, Brittany E. ([id number]) 5. Secondary Dressing Applied ABD Pad 7. Secured with 3 Layer  Compression System - Bilateral Notes SILVERCELL WITH GAUZE, COVER WITH ABD PAD AND KERLIX TO WRAP TOES. Electronic Signature(s) Signed: 06/06/2017 11:58:49 AM By: Lawanda Cousins Entered By: Lawanda Cousins on 06/06/2017 11:58:49 Fabel, [name] ([id number]) -------------------------------------------------------------------------------- [place] Details Patient Name: Gainer, Christain E. Date of Service: 06/06/2017 9:45 AM Medical Record Number: [id number] Patient Account Number: [id number] Date of Birth/Sex: [birth year] (67 y.o. Male) Treating RN: Ahmed Prima Primary Care Nishi Neiswonger: Lamonte Sakai Other Clinician: Referring Kelty Szafran: Myrtie Hawk Treating Kameron Blethen/Extender: Cathie Olden in Treatment: 84 Active Inactive ` Abuse / Safety / Falls / Self Care Management Nursing Diagnoses: Potential for falls Goals: Patient will remain injury free related to falls Date Initiated: 09/21/2016 Target Resolution Date: 09/14/2017 Goal Status: Active Interventions: Assess fall risk on admission and as needed Notes: ` Nutrition Nursing Diagnoses: Potential for alteratiion in Nutrition/Potential for imbalanced nutrition Goals: Patient/caregiver agrees to and verbalizes understanding of need to use nutritional supplements and/or vitamins as prescribed Date Initiated: 09/21/2016 Target Resolution Date: 09/14/2017 Goal Status: Active Interventions: Assess patient nutrition upon admission and as needed per policy Notes: ` Orientation to the Wound Care Program Nursing Diagnoses: Knowledge deficit related to the wound healing center program Goals: Patient/caregiver will verbalize understanding of the [facility] Date Initiated: 09/21/2016 Target Resolution Date: 06/15/2017 Goal Status: Active Interventions: DARKTurner, Baillie ([id number]) Provide education on orientation to the wound center Notes: ` Wound/Skin Impairment Nursing  Diagnoses: Knowledge deficit related to smoking impact on wound healing Goals: Ulcer/skin breakdown will have a volume reduction of 30% by week 4 Date Initiated: 09/21/2016 Target Resolution Date: 08/17/2017 Goal Status: Active Ulcer/skin breakdown will have a volume reduction of 50% by week 8 Date Initiated: 09/21/2016 Target Resolution Date: 08/17/2017 Goal Status: Active Ulcer/skin breakdown will have a volume reduction of 80% by week 12 Date Initiated: 09/21/2016 Target Resolution Date: 09/14/2017 Goal Status: Active Ulcer/skin breakdown will heal within 14 weeks Date Initiated: 09/21/2016 Target Resolution Date: 08/17/2017 Goal Status: Active Interventions: Assess patient/caregiver ability to obtain necessary supplies Assess patient/caregiver ability to perform ulcer/skin care regimen upon admission and as needed Assess ulceration(s) every visit Notes: Electronic Signature(s) Signed: 06/06/2017 4:59:29 PM By: Alric Quan Entered By: Alric Quan on 06/06/2017 10:43:54 Stokke, [name] ([id number]) -------------------------------------------------------------------------------- Pain Assessment Details Patient Name: Hettinger, Rodriguez E. Date of Service: 06/06/2017 9:45 AM Medical Record Number: [id number] Patient Account Number: [id number] Date of Birth/Sex: 1950/07/26 (67 y.o. Male) Treating RN: Montey Hora Primary Care Aailyah Dunbar: Lamonte Sakai Other Clinician: Referring Shanele Nissan: Myrtie Hawk Treating Alaylah Heatherington/Extender: Cathie Olden in Treatment: 38 Active Problems Location of Pain Severity and Description of Pain Patient Has Paino No Site Locations Pain Management and Medication Current Pain Management: Notes Topical or injectable lidocaine is offered to patient for acute pain when surgical debridement is performed. If needed, Patient is instructed to use over the counter pain medication for the following 24-48 hours after debridement. Wound care MDs do  not prescribed pain medications. Patient has chronic pain or uncontrolled pain. Patient has been instructed to make an appointment with their Primary Care Physician for pain management. Electronic Signature(s) Signed: 06/06/2017 4:10:15 PM By: Montey Hora Entered By: Montey Hora on 06/06/2017 10:19:29 Mccamish, [name] ([id number]) -------------------------------------------------------------------------------- Patient/Caregiver Education Details  Patient Name: Strothman, IZMAEL DUROSS. Date of Service: 06/06/2017 9:45 AM Medical Record Number: [id number] Patient Account Number: [id number] Date of Birth/Gender: 1950-06-04 (67 y.o. Male) Treating RN: Roger Shelter Primary Care Physician: Lamonte Sakai Other Clinician: Referring Physician: Myrtie Hawk Treating Physician/Extender: Cathie Olden in Treatment: 12 Education Assessment Education Provided To: Patient Education Topics Provided Wound/Skin Impairment: Handouts: Caring for Your Ulcer Methods: Explain/Verbal Responses: State content correctly Electronic Signature(s) Signed: 06/07/2017 5:56:09 PM By: Roger Shelter Entered By: Roger Shelter on 06/06/2017 11:09:21 Fulcher, [name] ([id number]) -------------------------------------------------------------------------------- Wound Assessment Details Patient Name: Minihan, Abrian E. Date of Service: 06/06/2017 9:45 AM Medical Record Number: [id number] Patient Account Number: [id number] Date of Birth/Sex: 24-Nov-1950 (67 y.o. Male) Treating RN: Montey Hora Primary Care Vickii Volland: Lamonte Sakai Other Clinician: Referring Denim Start: Myrtie Hawk Treating Arul Farabee/Extender: Cathie Olden in Treatment: 36 Wound Status Wound Number: 1 Primary Diabetic Wound/Ulcer of the Lower Extremity Etiology: Wound Location: Right Toe Great Wound Open Wounding Event: Gradually Appeared Status: Date Acquired: 06/11/2016 Comorbid Anemia, Lymphedema, Congestive Heart Weeks  Of Treatment: 36 History: Failure, Hypertension, Peripheral Venous Clustered Wound: No Disease, Type II Diabetes, Neuropathy Pending Amputation On Presentation Photos Photo Uploaded By: Montey Hora on 06/06/2017 11:33:19 Wound Measurements Length: (cm) 2.5 Width: (cm) 4 Depth: (cm) 0.1 Area: (cm) 7.854 Volume: (cm) 0.785 % Reduction in Area: 83.3% % Reduction in Volume: 83.3% Epithelialization: Small (1-33%) Tunneling: No Undermining: No Wound Description Classification: Grade 2 Foul O Wound Margin: Flat and Intact Slough Exudate Amount: Large Exudate Type: Serosanguineous Exudate Color: red, brown dor After Cleansing: No /Fibrino No Wound Bed Granulation Amount: Large (67-100%) Exposed Structure Granulation Quality: Red Fascia Exposed: No Necrotic Amount: Small (1-33%) Fat Layer (Subcutaneous Tissue) Exposed: Yes Necrotic Quality: Eschar, Adherent [place] Tendon Exposed: No Muscle Exposed: No Joint Exposed: No Bone Exposed: No Periwound Skin Texture Woon, Eliya E. ([id number]) Texture Color No Abnormalities Noted: No No Abnormalities Noted: No Callus: Yes Atrophie Blanche: No Crepitus: No Cyanosis: No Excoriation: Yes Ecchymosis: No Induration: No Erythema: No Rash: No Hemosiderin Staining: No Scarring: No Mottled: No Pallor: No Moisture Rubor: No No Abnormalities Noted: No Dry / Scaly: No Temperature / Pain Maceration: Yes Temperature: No Abnormality Wound Preparation Ulcer Cleansing: Rinsed/Irrigated with Saline Topical Anesthetic Applied: Other: lidocaine 4%, Treatment Notes Wound #1 (Right Toe Great) 1. Cleansed with: Clean wound with Normal Saline 2. Anesthetic Topical Lidocaine 4% cream to wound bed prior to debridement 4. Dressing Applied: Other dressing (specify in notes) 5. Secondary Dressing Applied ABD Pad 7. Secured with 3 Layer Compression System - Bilateral Notes SILVERCELL WITH GAUZE, COVER WITH ABD PAD AND KERLIX TO  WRAP TOES. Electronic Signature(s) Signed: 06/06/2017 4:10:15 PM By: Montey Hora Entered By: Montey Hora on 06/06/2017 10:28:55 Stodghill, [name] ([id number]) -------------------------------------------------------------------------------- Wound Assessment Details Patient Name: Zartman, Abednego E. Date of Service: 06/06/2017 9:45 AM Medical Record Number: [id number] Patient Account Number: [id number] Date of Birth/Sex: 06-May-1950 (67 y.o. Male) Treating RN: Montey Hora Primary Care Makar Slatter: Lamonte Sakai Other Clinician: Referring Yanel Dombrosky: Myrtie Hawk Treating Delaney Schnick/Extender: Cathie Olden in Treatment: 36 Wound Status Wound Number: 11 Primary Diabetic Wound/Ulcer of the Lower Etiology: Extremity Wound Location: Right Calcaneus Wound Status: Healed - Epithelialized Wounding Event: Gradually Appeared Date Acquired: 05/09/2017 Weeks Of Treatment: 3 Clustered Wound: No Photos Photo Uploaded By: Montey Hora on 06/06/2017 11:33:54 Wound Measurements Length: (cm) 0 % Width: (cm) 0 % Depth: (cm) 0 Area: (cm) 0 Volume: (cm) 0 Reduction in Area: 100% Reduction in Volume: 100% Wound Description  Classification: Grade 2 Periwound Skin Texture Texture Color No Abnormalities Noted: No No Abnormalities Noted: No Moisture No Abnormalities Noted: No Electronic Signature(s) Signed: 06/06/2017 4:10:15 PM By: Montey Hora Entered By: Montey Hora on 06/06/2017 10:26:48 Markman, Javarie E. ([id number]) -------------------------------------------------------------------------------- Wound Assessment Details Patient Name: Bufford, Fernado E. Date of Service: 06/06/2017 9:45 AM Medical Record Number: [id number] Patient Account Number: [id number] Date of Birth/Sex: [birth year] (67 y.o. Male) Treating RN: Montey Hora Primary Care Karas Pickerill: Lamonte Sakai Other Clinician: Referring Jaquay Morneault: Myrtie Hawk Treating Dareion Kneece/Extender: Cathie Olden in  Treatment: 36 Wound Status Wound Number: 2 Primary Diabetic Wound/Ulcer of the Lower Extremity Etiology: Wound Location: Right Toe Second Wound Open Wounding Event: Gradually Appeared Status: Date Acquired: 06/11/2016 Comorbid Anemia, Lymphedema, Congestive Heart Weeks Of Treatment: 36 History: Failure, Hypertension, Peripheral Venous Clustered Wound: No Disease, Type II Diabetes, Neuropathy Pending Amputation On Presentation Photos Photo Uploaded By: Montey Hora on 06/06/2017 11:33:55 Wound Measurements Length: (cm) 0.6 Width: (cm) 1.5 Depth: (cm) 0.1 Area: (cm) 0.707 Volume: (cm) 0.071 % Reduction in Area: 77% % Reduction in Volume: 76.9% Epithelialization: Medium (34-66%) Tunneling: No Undermining: No Wound Description Classification: Grade 2 Foul O Wound Margin: Flat and Intact Slough Exudate Amount: Large Exudate Type: Serosanguineous Exudate Color: red, brown dor After Cleansing: No /Fibrino No Wound Bed Granulation Amount: Large (67-100%) Exposed Structure Granulation Quality: Red Fascia Exposed: No Necrotic Amount: Small (1-33%) Fat Layer (Subcutaneous Tissue) Exposed: Yes Necrotic Quality: Adherent [place] Tendon Exposed: No Muscle Exposed: No Joint Exposed: No Bone Exposed: No Periwound Skin Texture Luellen, Marie E. ([id number]) Texture Color No Abnormalities Noted: No No Abnormalities Noted: No Callus: No Atrophie Blanche: No Crepitus: No Cyanosis: No Excoriation: No Ecchymosis: No Induration: No Erythema: No Rash: No Hemosiderin Staining: No Scarring: Yes Mottled: No Pallor: No Moisture Rubor: No No Abnormalities Noted: No Dry / Scaly: No Temperature / Pain Maceration: Yes Temperature: No Abnormality Wound Preparation Ulcer Cleansing: Rinsed/Irrigated with Saline Topical Anesthetic Applied: Other: lidocaine 4%, Treatment Notes Wound #2 (Right Toe Second) 1. Cleansed with: Clean wound with Normal Saline 2. Anesthetic Topical  Lidocaine 4% cream to wound bed prior to debridement 4. Dressing Applied: Other dressing (specify in notes) 5. Secondary Dressing Applied ABD Pad 7. Secured with 3 Layer Compression System - Bilateral Notes SILVERCELL WITH GAUZE, COVER WITH ABD PAD AND KERLIX TO WRAP TOES. Electronic Signature(s) Signed: 06/06/2017 4:10:15 PM By: Montey Hora Entered By: Montey Hora on 06/06/2017 10:29:14 Kana, [name] ([id number]) -------------------------------------------------------------------------------- Wound Assessment Details Patient Name: Musto, Quentyn E. Date of Service: 06/06/2017 9:45 AM Medical Record Number: [id number] Patient Account Number: [id number] Date of Birth/Sex: Jun 10, 1950 (67 y.o. Male) Treating RN: Montey Hora Primary Care Lenyx Boody: Lamonte Sakai Other Clinician: Referring Kimi Kroft: Myrtie Hawk Treating Shoua Ulloa/Extender: Cathie Olden in Treatment: 36 Wound Status Wound Number: 5 Primary Diabetic Wound/Ulcer of the Lower Extremity Etiology: Wound Location: Left Toe Great Wound Open Wounding Event: Gradually Appeared Status: Date Acquired: 06/11/2016 Comorbid Anemia, Lymphedema, Congestive Heart Weeks Of Treatment: 36 History: Failure, Hypertension, Peripheral Venous Clustered Wound: No Disease, Type II Diabetes, Neuropathy Pending Amputation On Presentation Photos Photo Uploaded By: Montey Hora on 06/06/2017 11:34:29 Wound Measurements Length: (cm) 1 Width: (cm) 2.3 Depth: (cm) 0.1 Area: (cm) 1.806 Volume: (cm) 0.181 % Reduction in Area: 87.4% % Reduction in Volume: 87.3% Epithelialization: Small (1-33%) Tunneling: No Undermining: No Wound Description Classification: Grade 2 Wound Margin: Flat and Intact Exudate Amount: Large Exudate Type: Serosanguineous Exudate Color: red, brown Foul Odor After Cleansing: Yes Due to Product  Use: No Slough/Fibrino No Wound Bed Granulation Amount: Medium (34-66%) Exposed  Structure Granulation Quality: Red Fascia Exposed: No Necrotic Amount: Small (1-33%) Fat Layer (Subcutaneous Tissue) Exposed: Yes Necrotic Quality: Adherent Slough Tendon Exposed: No Muscle Exposed: No Joint Exposed: No Bone Exposed: No Periwound Skin Texture Gittleman, Arda E. ([id number]) Texture Color No Abnormalities Noted: No No Abnormalities Noted: No Callus: No Atrophie Blanche: No Crepitus: No Cyanosis: No Excoriation: No Ecchymosis: No Induration: No Erythema: No Rash: No Hemosiderin Staining: No Scarring: No Mottled: No Pallor: No Moisture Rubor: No No Abnormalities Noted: No Dry / Scaly: No Temperature / Pain Maceration: Yes Temperature: No Abnormality Wound Preparation Ulcer Cleansing: Rinsed/Irrigated with Saline Topical Anesthetic Applied: Other: lidocaine 4%, Treatment Notes Wound #5 (Left Toe Great) 1. Cleansed with: Clean wound with Normal Saline 2. Anesthetic Topical Lidocaine 4% cream to wound bed prior to debridement 4. Dressing Applied: Other dressing (specify in notes) 5. Secondary Dressing Applied ABD Pad 7. Secured with 3 Layer Compression System - Bilateral Notes SILVERCELL WITH GAUZE, COVER WITH ABD PAD AND KERLIX TO WRAP TOES. Electronic Signature(s) Signed: 06/06/2017 4:10:15 PM By: Montey Hora Entered By: Montey Hora on 06/06/2017 10:29:34 Berdan, [name] ([id number]) -------------------------------------------------------------------------------- Wound Assessment Details Patient Name: Altamura, Elijiah E. Date of Service: 06/06/2017 9:45 AM Medical Record Number: [id number] Patient Account Number: [id number] Date of Birth/Sex: 06-16-1950 (67 y.o. Male) Treating RN: Montey Hora Primary Care Griffyn Kucinski: Lamonte Sakai Other Clinician: Referring Audy Dauphine: Myrtie Hawk Treating Kleber Crean/Extender: Cathie Olden in Treatment: 36 Wound Status Wound Number: 6 Primary Diabetic Wound/Ulcer of the Lower  Extremity Etiology: Wound Location: Left Toe Second Wound Open Wounding Event: Gradually Appeared Status: Date Acquired: 06/11/2016 Comorbid Anemia, Lymphedema, Congestive Heart Weeks Of Treatment: 36 History: Failure, Hypertension, Peripheral Venous Clustered Wound: No Disease, Type II Diabetes, Neuropathy Pending Amputation On Presentation Photos Photo Uploaded By: Montey Hora on 06/06/2017 11:34:30 Wound Measurements Length: (cm) 1.1 Width: (cm) 0.8 Depth: (cm) 0.1 Area: (cm) 0.691 Volume: (cm) 0.069 % Reduction in Area: 67.6% % Reduction in Volume: 67.8% Epithelialization: Small (1-33%) Tunneling: No Undermining: No Wound Description Classification: Grade 2 Foul O Wound Margin: Flat and Intact Slough Exudate Amount: Large Exudate Type: Serosanguineous Exudate Color: red, brown dor After Cleansing: No /Fibrino No Wound Bed Granulation Amount: Large (67-100%) Exposed Structure Granulation Quality: Red Fascia Exposed: No Necrotic Amount: Small (1-33%) Fat Layer (Subcutaneous Tissue) Exposed: Yes Necrotic Quality: Adherent Slough Tendon Exposed: No Muscle Exposed: No Joint Exposed: No Bone Exposed: No Periwound Skin Texture Kirkes, Blaiden E. ([id number]) Texture Color No Abnormalities Noted: No No Abnormalities Noted: No Callus: No Atrophie Blanche: No Crepitus: No Cyanosis: No Excoriation: No Ecchymosis: No Induration: No Erythema: No Rash: No Hemosiderin Staining: No Scarring: No Mottled: No Pallor: No Moisture Rubor: No No Abnormalities Noted: No Dry / Scaly: No Temperature / Pain Maceration: Yes Temperature: No Abnormality Wound Preparation Ulcer Cleansing: Rinsed/Irrigated with Saline Topical Anesthetic Applied: Other: lidocaine 4%, Treatment Notes Wound #6 (Left Toe Second) 1. Cleansed with: Clean wound with Normal Saline 2. Anesthetic Topical Lidocaine 4% cream to wound bed prior to debridement 4. Dressing Applied: Other  dressing (specify in notes) 5. Secondary Dressing Applied ABD Pad 7. Secured with 3 Layer Compression System - Bilateral Notes SILVERCELL WITH GAUZE, COVER WITH ABD PAD AND KERLIX TO WRAP TOES. Electronic Signature(s) Signed: 06/06/2017 4:10:15 PM By: Montey Hora Entered By: Montey Hora on 06/06/2017 10:29:51 Altschuler, [name] ([id number]) -------------------------------------------------------------------------------- Wound Assessment Details Patient Name: Bloomfield, Tiernan E. Date of Service: 06/06/2017 9:45  AM Medical Record Number: [id number] Patient Account Number: [id number] Date of Birth/Sex: December 22, 1950 (67 y.o. Male) Treating RN: Montey Hora Primary Care Lillianne Eick: Lamonte Sakai Other Clinician: Referring Matteo Banke: Myrtie Hawk Treating Majestic Molony/Extender: Cathie Olden in Treatment: 36 Wound Status Wound Number: 9 Primary Diabetic Wound/Ulcer of the Lower Extremity Etiology: Wound Location: Right Toe Third Wound Open Wounding Event: Gradually Appeared Status: Date Acquired: 12/31/2016 Comorbid Anemia, Lymphedema, Congestive Heart Weeks Of Treatment: 22 History: Failure, Hypertension, Peripheral Venous Clustered Wound: No Disease, Type II Diabetes, Neuropathy Photos Photo Uploaded By: Montey Hora on 06/06/2017 11:35:01 Wound Measurements Length: (cm) 0.4 Width: (cm) 0.5 Depth: (cm) 0.1 Area: (cm) 0.157 Volume: (cm) 0.016 % Reduction in Area: 60.1% % Reduction in Volume: 59% Epithelialization: None Tunneling: No Undermining: No Wound Description Classification: Grade 2 Wound Margin: Flat and Intact Exudate Amount: Small Exudate Type: Serous Exudate Color: amber Foul Odor After Cleansing: No Slough/Fibrino Yes Wound Bed Granulation Amount: Large (67-100%) Exposed Structure Granulation Quality: Red Fascia Exposed: No Necrotic Amount: Small (1-33%) Fat Layer (Subcutaneous Tissue) Exposed: Yes Necrotic Quality: Adherent Slough Tendon  Exposed: No Muscle Exposed: No Joint Exposed: No Bone Exposed: No Periwound Skin Texture Watkin, Theron E. ([id number]) Texture Color No Abnormalities Noted: No No Abnormalities Noted: No Callus: No Atrophie Blanche: No Crepitus: No Cyanosis: No Excoriation: No Ecchymosis: No Induration: No Erythema: No Rash: No Hemosiderin Staining: No Scarring: No Mottled: No Pallor: No Moisture Rubor: No No Abnormalities Noted: No Dry / Scaly: No Temperature / Pain Maceration: No Temperature: No Abnormality Wound Preparation Ulcer Cleansing: Rinsed/Irrigated with Saline Topical Anesthetic Applied: Other: lidocaine 4%, Treatment Notes Wound #9 (Right Toe Third) 1. Cleansed with: Clean wound with Normal Saline 2. Anesthetic Topical Lidocaine 4% cream to wound bed prior to debridement 4. Dressing Applied: Other dressing (specify in notes) 5. Secondary Dressing Applied ABD Pad 7. Secured with 3 Layer Compression System - Bilateral Notes SILVERCELL WITH GAUZE, COVER WITH ABD PAD AND KERLIX TO WRAP TOES. Electronic Signature(s) Signed: 06/06/2017 4:10:15 PM By: Montey Hora Entered By: Montey Hora on 06/06/2017 10:30:09 Macmaster, [name] ([id number]) -------------------------------------------------------------------------------- Vitals Details Patient Name: Rasheed, Wymon E. Date of Service: 06/06/2017 9:45 AM Medical Record Number: [id number] Patient Account Number: [id number] Date of Birth/Sex: 04/09/1951 (67 y.o. Male) Treating RN: Montey Hora Primary Care Meeka Cartelli: Lamonte Sakai Other Clinician: Referring Tanis Hensarling: Myrtie Hawk Treating Zed Wanninger/Extender: Cathie Olden in Treatment: 36 Vital Signs Time Taken: 10:19 Temperature (F): 98.0 Height (in): 69 Pulse (bpm): 76 Weight (lbs): 168 Respiratory Rate (breaths/min): 16 Body Mass Index (BMI): 24.8 Blood Pressure (mmHg): 148/76 Reference Range: 80 - 120 mg / dl Electronic Signature(s) Signed:  06/06/2017 4:10:15 PM By: Montey Hora Entered By: Montey Hora on 06/06/2017 10:19:46

## 2017-06-10 ENCOUNTER — Telehealth: Payer: Self-pay | Admitting: *Deleted

## 2017-06-10 NOTE — Telephone Encounter (Signed)
Called [name] and his cousin who transports him and said that Dr. Janese Banks says the hgb doing good for several weeks and is thinking about another injection that he would get once a month. She would like to hold the injections for next 2 weeks and decide on 3/21. He will still come for feraheme but does not need labs or injection.  Both patient and cousin agreeable to above plan

## 2017-06-13 ENCOUNTER — Ambulatory Visit: Payer: Medicare HMO

## 2017-06-13 ENCOUNTER — Other Ambulatory Visit: Payer: Medicare HMO

## 2017-06-13 ENCOUNTER — Encounter: Payer: Medicare HMO | Attending: Nurse Practitioner | Admitting: Nurse Practitioner

## 2017-06-13 ENCOUNTER — Inpatient Hospital Stay: Payer: Medicare HMO | Attending: Oncology

## 2017-06-13 VITALS — BP 145/81 | HR 64 | Temp 96.8°F | Resp 20

## 2017-06-13 DIAGNOSIS — N189 Chronic kidney disease, unspecified: Secondary | ICD-10-CM | POA: Insufficient documentation

## 2017-06-13 DIAGNOSIS — I509 Heart failure, unspecified: Secondary | ICD-10-CM | POA: Diagnosis not present

## 2017-06-13 DIAGNOSIS — I890 Lymphedema, not elsewhere classified: Secondary | ICD-10-CM | POA: Diagnosis not present

## 2017-06-13 DIAGNOSIS — I130 Hypertensive heart and chronic kidney disease with heart failure and stage 1 through stage 4 chronic kidney disease, or unspecified chronic kidney disease: Secondary | ICD-10-CM | POA: Diagnosis present

## 2017-06-13 DIAGNOSIS — F17210 Nicotine dependence, cigarettes, uncomplicated: Secondary | ICD-10-CM | POA: Diagnosis not present

## 2017-06-13 DIAGNOSIS — L97512 Non-pressure chronic ulcer of other part of right foot with fat layer exposed: Secondary | ICD-10-CM | POA: Insufficient documentation

## 2017-06-13 DIAGNOSIS — Z923 Personal history of irradiation: Secondary | ICD-10-CM | POA: Insufficient documentation

## 2017-06-13 DIAGNOSIS — D631 Anemia in chronic kidney disease: Secondary | ICD-10-CM | POA: Insufficient documentation

## 2017-06-13 DIAGNOSIS — D472 Monoclonal gammopathy: Secondary | ICD-10-CM | POA: Insufficient documentation

## 2017-06-13 DIAGNOSIS — I110 Hypertensive heart disease with heart failure: Secondary | ICD-10-CM | POA: Insufficient documentation

## 2017-06-13 DIAGNOSIS — L97522 Non-pressure chronic ulcer of other part of left foot with fat layer exposed: Secondary | ICD-10-CM | POA: Diagnosis not present

## 2017-06-13 DIAGNOSIS — E1122 Type 2 diabetes mellitus with diabetic chronic kidney disease: Secondary | ICD-10-CM | POA: Insufficient documentation

## 2017-06-13 DIAGNOSIS — Z85828 Personal history of other malignant neoplasm of skin: Secondary | ICD-10-CM | POA: Diagnosis not present

## 2017-06-13 DIAGNOSIS — E1142 Type 2 diabetes mellitus with diabetic polyneuropathy: Secondary | ICD-10-CM | POA: Insufficient documentation

## 2017-06-13 DIAGNOSIS — E11621 Type 2 diabetes mellitus with foot ulcer: Secondary | ICD-10-CM | POA: Diagnosis present

## 2017-06-13 DIAGNOSIS — Z79899 Other long term (current) drug therapy: Secondary | ICD-10-CM | POA: Insufficient documentation

## 2017-06-13 DIAGNOSIS — K219 Gastro-esophageal reflux disease without esophagitis: Secondary | ICD-10-CM | POA: Diagnosis not present

## 2017-06-13 DIAGNOSIS — D509 Iron deficiency anemia, unspecified: Secondary | ICD-10-CM

## 2017-06-13 DIAGNOSIS — J90 Pleural effusion, not elsewhere classified: Secondary | ICD-10-CM | POA: Insufficient documentation

## 2017-06-13 MED ORDER — SODIUM CHLORIDE 0.9 % IV SOLN
510.0000 mg | Freq: Once | INTRAVENOUS | Status: AC
  Administered 2017-06-13: 510 mg via INTRAVENOUS
  Filled 2017-06-13: qty 17

## 2017-06-13 MED ORDER — SODIUM CHLORIDE 0.9 % IV SOLN
Freq: Once | INTRAVENOUS | Status: AC
  Administered 2017-06-13: 11:00:00 via INTRAVENOUS
  Filled 2017-06-13: qty 1000

## 2017-06-14 ENCOUNTER — Ambulatory Visit (INDEPENDENT_AMBULATORY_CARE_PROVIDER_SITE_OTHER): Payer: Medicare HMO | Admitting: Surgery

## 2017-06-14 ENCOUNTER — Encounter: Payer: Self-pay | Admitting: Surgery

## 2017-06-14 ENCOUNTER — Telehealth: Payer: Self-pay | Admitting: *Deleted

## 2017-06-14 VITALS — BP 177/92 | HR 71 | Temp 97.8°F | Ht 69.0 in | Wt 173.0 lb

## 2017-06-14 DIAGNOSIS — K6289 Other specified diseases of anus and rectum: Secondary | ICD-10-CM | POA: Diagnosis not present

## 2017-06-14 NOTE — Progress Notes (Addendum)
DUSTAN, HYAMS ([id number]) Visit Report for 06/13/2017 Chief Complaint Document Details Patient Name: [name], [name]. Date of Service: 06/13/2017 9:15 AM Medical Record Number: [id number] Patient Account Number: [id number] Date of Birth/Sex: September 27, 1950 (67 y.o. Male) Treating RN: Ahmed Prima Primary Care Provider: Lamonte Sakai Other Clinician: Referring Provider: Lamonte Sakai Treating Provider/Extender: Cathie Olden in Treatment: 37 Information Obtained from: Patient Chief Complaint Patient presents for treatment of an open diabetic ulcer to both feet Electronic Signature(s) Signed: 06/13/2017 9:56:10 AM By: Lawanda Cousins Entered By: Lawanda Cousins on 06/13/2017 09:56:10 Martindelcampo, [name] ([id number]) -------------------------------------------------------------------------------- HPI Details Patient Name: [name], [name] Date of Service: 06/13/2017 9:15 AM Medical Record Number: [id number] Patient Account Number: [id number] Date of Birth/Sex: 1950/12/05 (67 y.o. Male) Treating RN: Ahmed Prima Primary Care Provider: Lamonte Sakai Other Clinician: Referring Provider: Lamonte Sakai Treating Provider/Extender: Cathie Olden in Treatment: 37 History of Present Illness Location: bilateral feet ulceration on the toes Quality: Patient reports experiencing a dull pain to affected area(s). Severity: Patient states wound are getting better Duration: Patient has had the wound for > 3 months prior to seeking treatment at the wound center Timing: Pain in wound is constant (hurts all the time) Context: The wound would happen gradually Modifying Factors: Other treatment(s) tried include:treatment for lymphedema and is seen by the podiatrist Dr. Caryl Comes Associated Signs and Symptoms: Patient reports having increase swelling. HPI Description: 67 year old patient here to see as for bilateral feet ulceration to on his left first and second toe and 2 on his right first and second toe, which  she's had for about 4 months. 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 Donella Stade. The patient is also undergoing workup by medical oncology for a intra-abdominal lymphadenopathy. 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 He is also status post appendectomy, inguinal lymph node biopsy, rectal biopsy and rectal examination under anesthesia. he currently smokes cigarettes about half packet a day. In March of this year he was seen by Dr. Hortencia Pilar, for evaluation of bilateral varicose veins and besides wearing compression stockings he had recommended laser ablation of the right and left great saphenous veins to eleviate the symptoms and complications of severe superficial venous reflux disease. He also recommended lymphedema pumps for better control of his lymphedema. The patient recently has had on 08/23/2016, right greater saphenous vein ablation with the laser energy Earlier lower extremity venous reflux examination done on 05/08/2016 showed no DVT or SVT both lower legs but incompetence of bilateral great saphenous veins was present. A lower arterial study was also done and there was no significant right lower and left lower extremity problems based on a normal toe brachial index bilaterally and the ABI was 1.21 the left and 1.23 on the right. His post ablation venous duplex examination showed successful ablation of the right GS vein with thrombus formation 2 below the right saphenofemoral junction. The deep system was patent without evidence of thrombosis and this was done on 08/30/2016. the patient also has a squamous cell cancer of the skin of the buttock and is recently undergone radiation therapy for this prior to excisional  surgery. Addendum: regarding his x-rays done today and x-ray of the left foot -- IMPRESSION: No objective evidence of osteomyelitis. There are soft tissue changes which  may reflect cellulitis. X-ray of the right foot -- IMPRESSION:Findings compatible with cellulitis of the toes. No objective evidence of osteomyelitis is observed. 10/01/16 on evaluation today patient's wounds appeared to be doing some better. I did review the x-rays as well which showed no evidence of osteomyelitis although there was evidence on x-ray of cellulitis. He fortunately is not having any discomfort although he continues to have some swelling. He does not remember being on any antibiotics recently. 10/15/16 on evaluation today patient's wounds overall appear to be doing better although he does have a new location noted on the left foot. Fortunately he is not having significant pain. It almost has the appearance that something is rubbing on the end of his toes but he wears the open toe shoes and according to what he is telling me never wears anything that would rub on his foot. There is no evidence of infection and specifically no evidence of a fungal infection 10/22/16 On evaluation today patient's wounds appeared to be doing better compared to last week in regard to his bilateral [name], [name] ([id number]) lower extremities. Fortunately I happy with how things are progressing although he still has ulcers I feel like that he is improving and appropriate manner. 11/12/16 on evaluation today patient appears to be doing well in regard to his bilateral feet and the respective wounds. We have been using surrounding her dressings along with an antifungal cream which seems to be doing very well. He has no bilateral dysfunction noticed that the rituals are weight loss at this point. He also has no nausea or vomiting a note purulent discharge. He did see Vein and vascular today and he tells me that they told him he could have surgery  for his venous stasis but they did not feel like it was worth it in his words. Fortunately patient's wounds do appear to be getting sneakily better. 11/26/2016 -- he says he is going to have some surgery during this week at [facility] for possibly a colon resection. 12/31/2016 -- the patient has been noncompliant with his smoking and I'm not sure whether he is also started drinking again. He continues to be very nonchalant about his care 01/14/2017 -- the patient's HandP has been reviewed well and I understand he is being compliant with trying to give up smoking and his local dressing changes. He does not have any surgical options of 4 to him by his vascular surgeons.he was last seen in early August by Dr. Hortencia Pilar who recommended compression stockings,and possibly lymph pumps in 2-3 months after doing a review ultrasound. 01/28/2017 - the patient did not have any fresh complaints but on examination I noted a large lacerated wound on the plantar aspect of his right fourth toe which had a lot of necrotic debris and it probes down to bone. 02/07/2017 -- x-ray of the right foot -- IMPRESSION: Soft tissue swelling about the first through fourth toes consistent with cellulitis. New destructive change in the tuft of the distal phalanx of the great toe is consistent with osteomyelitis. 02/14/2017 -- the patient's MRI is pending this coming Monday and he still continues to smoke. We have again gone over off loading of his wounds in great detail and he says he's been compliant. 02/21/2017 -- MR of the right foot -- IMPRESSION: 1. Soft tissue ulcer at the tip of the first, second and third toe knows. Cortical irregularity and bone marrow edema in the first distal phalanx most concerning for osteomyelitis. Mild marrow edema  in the second and third distal phalanx without definite cortical destruction which may reflect early osteomyelitis versus reactive marrow edema. 2. Soft tissue edema surrounding  the first phalanx most consistent with cellulitis. the patient was also recently evaluated by his medical oncologist Dr. 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 She is treating him with weekly Procrit. She is also keeping intra-abdominal lymphadenopathy and right lower lobe lung nodule under observation. 04/04/2017 -- he was seen by Dr. Adrian Prows on 03/25/2017 -- after review he empirically put him on ciprofloxacin and doxycycline as they have good bone penetration and good bioavailability and it will cover the usual pathogens and diabetic foot osteomyelitis. He will check inflammatory markers and plan a 74-64 week old records. C-reactive protein was 0.3 and the ESR was 72 04/18/17 on evaluation today patient appears to be doing about the same in regard to his lower extremity wounds bilaterally. He has continued to use the antifungal cream which does seem to be beneficial. Nonetheless the ulcers do seem to in some areas be epithelial eyes over and in other areas are still open. He is having no significant discomfort. 04/25/17-he is here in follow-up evaluation for multiple ulcerations to multiple toes bilaterally. He states he did see Dr. Ola Spurr again last week and continues antibiotic therapy. He is voicing no complaints or concerns, will continue with current treatment plan will possibility of adding compression therapy next week after an additional week of treatment/lotions to BLE prescribed by Dr Ola Spurr 05/02/17 he is here in follow up for for multiple ulcers to multiple toes bilaterally. we will stop using antifungal cream and will continue with silvercel and follow up next week 05/09/17-he is here in follow-up for multiple ulcerations to multiple toes bilaterally. There is improvement in appearance. He has not completely stopped using antifungal cream, but admits he has not using it between the toes. He has an  appointment with Dr. Ola Spurr on 2/11, continues on doxycycline and Cipro. It has been 5 weeks of antibiotic therapy, we will order plain film xray to evaluate for osteomyelitis next week, prior to follow up with ID. Will continue with silvercel and follow up next week 05/16/17-he is here in follow-up evaluation for multiple ulcerations to multiple toes bilaterally and new wound to the right posterior heel. There is essentially no change in appearance, deteriorating measurements; he has a history of waxing and waning measurements. He admits that he continues to apply moisturizer/cream/ointment to his toes despite weekly reminders to only apply silvercel to his toes. He states that he thinks the surgical shoe contributed to the superficial ulcer to his posterior Brackney, Dontre E. ([id number]) heel, he is unable to articulate if this was an area of dry cracked skin as he has a similar area to the left heel. He now is wearing open toed slippers. He has an appointment with Dr. Ola Spurr on 2/11. We have ordered x-rays for her bilateral feet; he was advised to obtain the x-rays today or tomorrow. He will follow-up next week 05/23/17-he is here in follow-up evaluation for multiple ulcerations to multiple toes bilaterally and the right posterior heel. There is improvement in maceration. He has been compliant and not applying any moisturizing agent to his toes. He has been using Lac-Hydrin for his lower extremities with improvement. He did not go to his appointment on Monday with Dr. Ola Spurr secondary to financial concerns. X-rays for her bilateral feet showed: LEFT FOOT with slight  erosion of the tuft of the distal phalanges of the left first and second toe suspicious for osteomyelitis, RIGHT FOOT with 1.erosion of the tufts of the distal phalanges of the right first second and possibly third toes consistent with osteomyelitis, 2 no definitive abnormality of the calcaneus is seen on the images obtained, 3.  Plantar calcaneal degenerative spur. We briefly discussed hyperbaric adjunctive therapy for treatment of chronic refractory osteomyelitis. I do not find an a1c in EMR, will contact PCP for record, or order if needed. He has been encouraged to contact Dr Ola Spurr office regarding the follow-up appointment, encouraged him to inquire about payment plan. We will continue with same treatment plan and follow-up next week. He states he is still taking antibiotics and has "a lot" left. He states he has been taking them as directed, 2 pills twice daily. According to Dr. Blane Ohara office notes he was originally started on 12/17 for 4 weeks and extended on 1/14 for an additional 4 weeks. He should be done with his antibiotic therapy, he was advised to bring his bottles and to his next appointment, we will contact pharmacy. 05/30/17-he is here in follow-up evaluation for multiple ulcerations to multiple toes bilaterally and the right posterior heel. He is accompanied by his brother-in-law. Wounds are stable. He has yet to make up with Dr. Ola Spurr. We contacted his PCP, with no record of recent A1c we will draw an A1c. His brother-in-law states that he was taken off all of his diabetic medication secondary to kidney function. 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 The antibiotics were counted and have approximately 2 weeks left, although they should be complete. The pill organizer reveals missing days. We discussed the need for consistent medications, to have optimal benefit of medication. He has a cousin that lives with him and he will ask her to check his organizer daily. He has been advised to follow up with Dr Ola Spurr, and will go by the office today. He has been advised to quit smoking. 06/06/17-he is here in follow up evaluation. He has had to make an appointment with Dr. Ola Spurr. He did have  blood work obtained, a1c 5. He continues to take antibiotic therapy. Significant improvement in bilateral lower extremity edema with compression therapy. Essentially no change in ulcerations to toes. He states he is "going to try something different" and "let me know next week" if it works; he would not provide any additional information and was encouraged to follow our orders. We will follow up next week 06/13/17-he is here in follow-up evaluation. He has an appointment with Dr. Ola Spurr tomorrow morning. He states he purchased an ointment from [facility] and applied to his toes for 3 days, he does not remember the name of the ointment. There is improvement to his wounds, minimal maceration. He continues to take antibiotic therapy, this should have been completed last month. His brother-in-law who regularly accompanies his appointments was asked to take the bottles to the appointment tomorrow with Dr. Ola Spurr so he is aware. We will continue with 3 layer compression, and order OPEN TOE compression 20-30mmHg; we will apply compression stockings next week. He continues to smoke, smoked "2 cigarettes" last week Electronic Signature(s) Signed: 06/13/2017 10:02:29 AM By: Lawanda Cousins Previous Signature: 06/13/2017 9:59:20 AM Version By: Lawanda Cousins Previous Signature: 06/13/2017 9:58:42 AM Version By: Lawanda Cousins Entered By: Lawanda Cousins on 06/13/2017 10:02:28 Schryver, [name] ([id number]) -------------------------------------------------------------------------------- Physician Orders  Details Patient Name: Hilmer, IDREES QUAM. Date of Service: 06/13/2017 9:15 AM Medical Record Number: [id number] Patient Account Number: [id number] Date of Birth/Sex: 1951/02/03 (67 y.o. Male) Treating RN: Ahmed Prima Primary Care Provider: Lamonte Sakai Other Clinician: Referring Provider: Lamonte Sakai Treating Provider/Extender: Cathie Olden in Treatment: 29 Verbal / Phone Orders: Yes Clinician: Carolyne Fiscal,  Debi Read Back and Verified: Yes Diagnosis Coding Wound Cleansing Wound #1 Right Toe Great o Clean wound with Normal Saline. o May Shower, gently pat wound dry prior to applying new dressing. Wound #2 Right Toe Second o Clean wound with Normal Saline. o May Shower, gently pat wound dry prior to applying new dressing. Wound #5 Left Toe Great o Clean wound with Normal Saline. o May Shower, gently pat wound dry prior to applying new dressing. Wound #6 Left Toe Second o Clean wound with Normal Saline. o May Shower, gently pat wound dry prior to applying new dressing. Wound #9 Right Toe Third o Clean wound with Normal Saline. o May Shower, gently pat wound dry prior to applying new dressing. Anesthetic (add to Medication List) Wound #1 Right Toe Great o Topical Lidocaine 4% cream applied to wound bed prior to debridement (In Clinic Only). Wound #2 Right Toe Second o Topical Lidocaine 4% cream applied to wound bed prior to debridement (In Clinic Only). Wound #5 Left Toe Great o Topical Lidocaine 4% cream applied to wound bed prior to debridement (In Clinic Only). Wound #6 Left Toe Second o Topical Lidocaine 4% cream applied to wound bed prior to debridement (In Clinic Only). Wound #9 Right Toe Third o Topical Lidocaine 4% cream applied to wound bed prior to debridement (In Clinic Only). Primary Wound Dressing Wound #1 Right Toe Great o Silvercel Non-Adherent Wound #2 Right Toe Second o Silvercel Non-Adherent Wound #5 Left Toe Great Nouri, Linkyn E. ([id number]) o Silvercel Non-Adherent Wound #6 Left Toe Second o Silvercel Non-Adherent Wound #9 Right Toe Third o Silvercel Non-Adherent Secondary Dressing Wound #1 Right Toe Great o ABD and Kerlix/Conform Wound #2 Right Toe Second o ABD and Kerlix/Conform Wound #5 Left Toe Great o ABD and Kerlix/Conform Wound #6 Left Toe Second o ABD and Kerlix/Conform Wound #9 Right Toe  Third o ABD and Kerlix/Conform Dressing Change Frequency Wound #1 Right Toe Great o Change dressing every day. Wound #2 Right Toe Second o Change dressing every day. Wound #5 Left Toe Great o Change dressing every day. Wound #6 Left Toe Second o Change dressing every day. Wound #9 Right Toe Third o Change dressing every day. Follow-up Appointments Wound #1 Right Toe Great o Return Appointment in 1 week. Wound #2 Right Toe Second o Return Appointment in 1 week. Wound #5 Left Toe Great o Return Appointment in 1 week. Wound #6 Left Toe Second o Return Appointment in 1 week. Wound #9 Right Toe Third o Return Appointment in 1 week. EDVIN, ALBUS ([id number]) Edema Control o 3 Layer Compression System - Bilateral - unna to anchor change weekly Additional Orders / Instructions Wound #1 Right Toe Great o Stop Smoking o Increase protein intake. o Other: - Please add vitamin A, vitamin C and zinc supplements to your diet Wound #2 Right Toe Second o Stop Smoking o Increase protein intake. o Other: - Please add vitamin A, vitamin C and zinc supplements to your diet Wound #5 Left Toe Great o Stop Smoking o Increase protein intake. o Other: - Please add vitamin A, vitamin C and zinc supplements to your diet Wound #6 Left  Toe Second o Stop Smoking o Increase protein intake. o Other: - Please add vitamin A, vitamin C and zinc supplements to your diet Wound #9 Right Toe Third o Stop Smoking o Increase protein intake. o Other: - Please add vitamin A, vitamin C and zinc supplements to your diet Patient Medications Allergies: No Known Drug Allergies Notifications Medication Indication Start End lidocaine DOSE 1 - topical 4 % cream - 1 cream topical Electronic Signature(s) Signed: 06/13/2017 4:33:49 PM By: Alric Quan Signed: 06/13/2017 5:22:04 PM By: Lawanda Cousins Entered By: Alric Quan on 06/13/2017 09:51:04 Kilbourne,  [name] ([id number]) -------------------------------------------------------------------------------- Prescription 06/13/2017 Patient Name: [name] Provider: Lawanda Cousins NP Date of Birth: 02/07/1951 NPI#: [id number] Sex: Jerilynn Mages DEA#: [id number] Phone #: [phone] License #: Patient Address: [facility] [place], [place] [address] [address] [place], [place] [address] [phone] Allergies No Known Drug Allergies Medication Medication: Route: Strength: Form: lidocaine topical 4% cream Class: TOPICAL LOCAL ANESTHETICS Dose: Frequency / Time: Indication: 1 1 cream topical Number of Refills: Number of Units: 0 Generic Substitution: Start Date: End Date: Administered at Substitution Permitted Facility: Yes Time Administered: Time Discontinued: Note to Pharmacy: Signature(s): Date(s): Electronic Signature(s) Signed: 06/13/2017 4:33:49 PM By: Alric Quan Signed: 06/13/2017 5:22:04 PM By: Lawanda Cousins Entered By: Alric Quan on 06/13/2017 09:51:05 Mini, Deveron E. ([id number]) Twining, Shawn E. ([id number]) --------------------------------------------------------------------------------  Problem List Details Patient Name: Hauk, [name] Date of Service: 06/13/2017 9:15 AM Medical Record Number: [id number] Patient Account Number: [id number] Date of Birth/Sex: Sep 23, 1950 (67 y.o. Male) Treating RN: Ahmed Prima Primary Care Provider: Lamonte Sakai Other Clinician: Referring Provider: Lamonte Sakai Treating Provider/Extender: Cathie Olden in Treatment: 37 Active Problems ICD-10 Encounter Code Description Active Date Diagnosis E11.621 Type 2 diabetes mellitus with foot ulcer 09/21/2016 Yes I87.313 Chronic venous hypertension (idiopathic) with ulcer of bilateral 09/21/2016 Yes lower extremity I89.0 Lymphedema, not elsewhere classified 09/21/2016 Yes L97.522  Non-pressure chronic ulcer of other part of left foot with fat layer 09/21/2016 Yes exposed L97.512 Non-pressure chronic ulcer of other part of right foot with fat layer 09/21/2016 Yes exposed F17.218 Nicotine dependence, cigarettes, with other nicotine-induced 09/21/2016 Yes disorders F10.19 Alcohol abuse with unspecified alcohol-induced disorder 09/21/2016 Yes M86.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, right ankle and foot 02/21/2017 Yes Inactive Problems Resolved Problems Electronic Signature(s) Signed: 06/13/2017 9:55:53 AM By: Lawanda Cousins Entered By: Lawanda Cousins on 06/13/2017 09:55:53 Sylvan, [name] ([id number]) -------------------------------------------------------------------------------- Progress Note Details Patient Name: Sutch, Aiman E. Date of Service: 06/13/2017 9:15 AM Medical Record Number: [id number] Patient Account Number: [id number] Date of Birth/Sex: [birth year] (67 y.o. Male) Treating RN: Ahmed Prima Primary Care Provider: Lamonte Sakai Other Clinician: Referring Provider: Lamonte Sakai Treating Provider/Extender: Cathie Olden in Treatment: 54 Subjective Chief Complaint Information obtained from Patient Patient presents for treatment of an open diabetic ulcer to both feet History of Present Illness (HPI) The following HPI elements were documented for the patient's wound: Location: bilateral feet ulceration on the toes Quality: Patient reports experiencing a dull pain to affected area(s). Severity: Patient states wound are getting better Duration: Patient has had the wound for > 3 months prior to seeking treatment at the wound center Timing: Pain in wound is constant (hurts all the time) Context: The wound would happen gradually Modifying Factors: Other treatment(s) tried include:treatment for lymphedema and is seen by the podiatrist Dr. Caryl Comes Associated Signs and Symptoms: Patient reports having increase swelling. 67 year old patient here to see as for  bilateral feet ulceration to on his left first and second toe and 2 on his right first and second toe, which she's had for about 4 months. 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 Donella Stade. The patient is also undergoing workup by medical oncology for a intra-abdominal lymphadenopathy. 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 He is also status post appendectomy, inguinal lymph node biopsy, rectal biopsy and rectal examination under anesthesia. he currently smokes cigarettes about half packet a day. In March of this year he was seen by Dr. Hortencia Pilar, for evaluation of bilateral varicose veins and besides wearing compression stockings he had recommended laser ablation of the right and left great saphenous veins to eleviate the symptoms and complications of severe superficial venous reflux disease. He also recommended lymphedema pumps for better control of his lymphedema. The patient recently has had on 08/23/2016, right greater saphenous vein ablation with the laser energy Earlier lower extremity venous reflux examination done on 05/08/2016 showed no DVT or SVT both lower legs but incompetence of bilateral great saphenous veins was present. A lower arterial study was also done and there was no significant right lower and left lower extremity problems based on a normal toe brachial index bilaterally and the ABI was 1.21 the left and 1.23 on the right. His post ablation venous duplex examination showed successful ablation of the right GS vein with thrombus formation 2 below the right saphenofemoral junction. The deep system was patent without evidence of thrombosis and this was done on 08/30/2016. 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 Addendum: regarding his x-rays done today and x-ray of the left foot -- IMPRESSION: No objective evidence of osteomyelitis. There are soft tissue changes which may reflect cellulitis. X-ray of the right foot -- IMPRESSION:Findings compatible with cellulitis of the toes. No objective evidence of osteomyelitis is observed. 10/01/16 on evaluation today patient's wounds appeared to be doing some better. I did review the x-rays as well which showed no evidence of osteomyelitis although there was evidence on x-ray of cellulitis. He fortunately is not having any discomfort Patchell, Franz E. ([id number]) although he continues to have some swelling. He does not remember being on any antibiotics recently. 10/15/16 on evaluation today patient's wounds overall appear to be doing better although he does have a new location noted on the left foot. Fortunately he is not having significant pain. It almost has the appearance that something is rubbing on the end of his toes but he wears the open toe shoes and according to what he is telling me never wears anything that would rub on his foot. There is no evidence of infection and specifically no evidence of a fungal infection 10/22/16 On evaluation today patient's wounds appeared to be doing better compared to last week in regard to his bilateral lower extremities. Fortunately I happy with how things are progressing although he still has ulcers I feel like that he is improving and appropriate manner. 11/12/16 on evaluation today patient appears to be doing well in regard to his bilateral feet and the respective wounds. We have been using surrounding her dressings along with an antifungal cream which seems to be  doing very well. He has no bilateral dysfunction noticed that the rituals are weight loss at this point. He also has no nausea or vomiting a note purulent  discharge. He did see Vein and vascular today and he tells me that they told him he could have surgery for his venous stasis but they did not feel like it was worth it in his words. Fortunately patient's wounds do appear to be getting sneakily better. 11/26/2016 -- he says he is going to have some surgery during this week at [facility] for possibly a colon resection. 12/31/2016 -- the patient has been noncompliant with his smoking and I'm not sure whether he is also started drinking again. He continues to be very nonchalant about his care 01/14/2017 -- the patient's HandP has been reviewed well and I understand he is being compliant with trying to give up smoking and his local dressing changes. He does not have any surgical options of 4 to him by his vascular surgeons.he was last seen in early August by Dr. Hortencia Pilar who recommended compression stockings,and possibly lymph pumps in 2-3 months after doing a review ultrasound. 01/28/2017 - the patient did not have any fresh complaints but on examination I noted a large lacerated wound on the plantar aspect of his right fourth toe which had a lot of necrotic debris and it probes down to bone. 02/07/2017 -- x-ray of the right foot -- IMPRESSION: Soft tissue swelling about the first through fourth toes consistent with cellulitis. New destructive change in the tuft of the distal phalanx of the great toe is consistent with osteomyelitis. 02/14/2017 -- the patient's MRI is pending this coming Monday and he still continues to smoke. We have again gone over off loading of his wounds in great detail and he says he's been compliant. 02/21/2017 -- MR of the right foot -- IMPRESSION: 1. Soft tissue ulcer at the tip of the first, second and third toe knows. Cortical irregularity and bone marrow edema in the first distal phalanx most concerning for osteomyelitis. Mild marrow edema in the second and third distal phalanx without definite cortical  destruction which may reflect early osteomyelitis versus reactive marrow edema. 2. Soft tissue edema surrounding the first phalanx most consistent with cellulitis. the patient was also recently evaluated by his medical oncologist Dr. 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 She is treating him with weekly Procrit. She is also keeping intra-abdominal lymphadenopathy and right lower lobe lung nodule under observation. 04/04/2017 -- he was seen by Dr. Adrian Prows on 03/25/2017 -- after review he empirically put him on ciprofloxacin and doxycycline as they have good bone penetration and good bioavailability and it will cover the usual pathogens and diabetic foot osteomyelitis. He will check inflammatory markers and plan a 28-65 week old records. C-reactive protein was 0.3 and the ESR was 72 04/18/17 on evaluation today patient appears to be doing about the same in regard to his lower extremity wounds bilaterally. He has continued to use the antifungal cream which does seem to be beneficial. Nonetheless the ulcers do seem to in some areas be epithelial eyes over and in other areas are still open. He is having no significant discomfort. 04/25/17-he is here in follow-up evaluation for multiple ulcerations to multiple toes bilaterally. He states he did see Dr. Ola Spurr again last week and continues antibiotic therapy. He is voicing no complaints or concerns, will continue with current treatment plan will  possibility of adding compression therapy next week after an additional week of treatment/lotions to BLE prescribed by Dr Ola Spurr 05/02/17 he is here in follow up for for multiple ulcers to multiple toes bilaterally. we will stop using antifungal cream and will continue with silvercel and follow up next week Felter, Zayvier E. ([id number]) 05/09/17-he is here in follow-up for multiple ulcerations to multiple toes bilaterally. There  is improvement in appearance. He has not completely stopped using antifungal cream, but admits he has not using it between the toes. He has an appointment with Dr. Ola Spurr on 2/11, continues on doxycycline and Cipro. It has been 5 weeks of antibiotic therapy, we will order plain film xray to evaluate for osteomyelitis next week, prior to follow up with ID. Will continue with silvercel and follow up next week 05/16/17-he is here in follow-up evaluation for multiple ulcerations to multiple toes bilaterally and new wound to the right posterior heel. There is essentially no change in appearance, deteriorating measurements; he has a history of waxing and waning measurements. He admits that he continues to apply moisturizer/cream/ointment to his toes despite weekly reminders to only apply silvercel to his toes. He states that he thinks the surgical shoe contributed to the superficial ulcer to his posterior heel, he is unable to articulate if this was an area of dry cracked skin as he has a similar area to the left heel. He now is wearing open toed slippers. He has an appointment with Dr. Ola Spurr on 2/11. We have ordered x-rays for her bilateral feet; he was advised to obtain the x-rays today or tomorrow. He will follow-up next week 05/23/17-he is here in follow-up evaluation for multiple ulcerations to multiple toes bilaterally and the right posterior heel. There is improvement in maceration. He has been compliant and not applying any moisturizing agent to his toes. He has been using Lac-Hydrin for his lower extremities with improvement. He did not go to his appointment on Monday with Dr. Ola Spurr secondary to financial concerns. X-rays for her bilateral feet showed: LEFT FOOT with slight erosion of the tuft of the distal phalanges of the left first and second toe suspicious for osteomyelitis, RIGHT FOOT with 1.erosion of the tufts of the distal phalanges of the right first second and possibly third  toes consistent with osteomyelitis, 2 no definitive abnormality of the calcaneus is seen on the images obtained, 3. Plantar calcaneal degenerative spur. We briefly discussed hyperbaric adjunctive therapy for treatment of chronic refractory osteomyelitis. I do not find an a1c in EMR, will contact PCP for record, or order if needed. He has been encouraged to contact Dr Ola Spurr office regarding the follow-up appointment, encouraged him to inquire about payment plan. We will continue with same treatment plan and follow-up next week. He states he is still taking antibiotics and has "a lot" left. He states he has been taking them as directed, 2 pills twice daily. According to Dr. Blane Ohara office notes he was originally started on 12/17 for 4 weeks and extended on 1/14 for an additional 4 weeks. He should be done with his antibiotic therapy, he was advised to bring his bottles and to his next appointment, we will contact pharmacy. 05/30/17-he is here in follow-up evaluation for multiple ulcerations to multiple toes bilaterally and the right posterior heel. He is accompanied by his brother-in-law. Wounds are stable. He has yet to make up with Dr. Ola Spurr. We contacted his PCP, with no record of recent A1c we will draw an A1c. His brother-in-law states that  he was taken off all of his diabetic medication secondary to kidney function. 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 The antibiotics were counted and have approximately 2 weeks left, although they should be complete. The pill organizer reveals missing days. We discussed the need for consistent medications, to have optimal benefit of medication. He has a cousin that lives with him and he will ask her to check his organizer daily. He has been advised to follow up with Dr Ola Spurr, and will go by the office today. He has been advised to quit  smoking. 06/06/17-he is here in follow up evaluation. He has had to make an appointment with Dr. Ola Spurr. He did have blood work obtained, a1c 5. He continues to take antibiotic therapy. Significant improvement in bilateral lower extremity edema with compression therapy. Essentially no change in ulcerations to toes. He states he is "going to try something different" and "let me know next week" if it works; he would not provide any additional information and was encouraged to follow our orders. We will follow up next week 06/13/17-he is here in follow-up evaluation. He has an appointment with Dr. Ola Spurr tomorrow morning. He states he purchased an ointment from [facility] and applied to his toes for 3 days, he does not remember the name of the ointment. There is improvement to his wounds, minimal maceration. He continues to take antibiotic therapy, this should have been completed last month. His brother-in-law who regularly accompanies his appointments was asked to take the bottles to the appointment tomorrow with Dr. Ola Spurr so he is aware. We will continue with 3 layer compression, and order OPEN TOE compression 20-30mmHg; we will apply compression stockings next week. He continues to smoke, smoked "2 cigarettes" last week Patient History Information obtained from Patient. Social History Current every day smoker, Marital Status - Widowed, Alcohol Use - Daily - quit drinking about a week ago, Drug Use - No History, Caffeine Use - Moderate. Medical And Surgical History Notes Oncologic 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 Marinaro, Maribel E. ([id number]) Objective Constitutional Vitals Time Taken: 9:24 AM, Height: 69 in, Weight: 168 lbs, BMI: 24.8, Temperature: 98.3 F, Pulse: 76 bpm, Respiratory Rate: 18 breaths/min, Blood Pressure: 148/86 mmHg. Integumentary (Hair, Skin) Wound #1 status is Open. Original cause of wound was Gradually Appeared. The wound is located  on the Right Toe Great. The wound measures 1cm length x 4cm width x 0.1cm depth; 3.142cm^2 area and 0.314cm^3 volume. There is Fat Layer (Subcutaneous Tissue) Exposed exposed. There is no tunneling or undermining noted. There is a large amount of serosanguineous drainage noted. The wound margin is flat and intact. There is large (67-100%) red granulation within the wound bed. There is a small (1-33%) amount of necrotic tissue within the wound bed including Eschar and Adherent Slough. The periwound skin appearance exhibited: Callus, Excoriation, Maceration. The periwound skin appearance did not exhibit: Crepitus, Induration, Rash, Scarring, Dry/Scaly, Atrophie Blanche, Cyanosis, Ecchymosis, Hemosiderin Staining, Mottled, Pallor, Rubor, Erythema. Periwound temperature was noted as No Abnormality. Wound #2 status is Open. Original cause of wound was Gradually Appeared. The wound is located on the Right Toe Second. The wound measures 0.6cm length x 1.2cm width x 0.1cm depth; 0.565cm^2 area and 0.057cm^3 volume. There is Fat Layer (Subcutaneous Tissue) Exposed exposed. There is no tunneling or undermining noted. There is a large amount of serosanguineous drainage noted. The wound margin is flat and  intact. There is large (67-100%) red granulation within the wound bed. There is a small (1-33%) amount of necrotic tissue within the wound bed including Adherent Slough. The periwound skin appearance exhibited: Scarring, Maceration. The periwound skin appearance did not exhibit: Callus, Crepitus, Excoriation, Induration, Rash, Dry/Scaly, Atrophie Blanche, Cyanosis, Ecchymosis, Hemosiderin Staining, Mottled, Pallor, Rubor, Erythema. Periwound temperature was noted as No Abnormality. Wound #5 status is Open. Original cause of wound was Gradually Appeared. The wound is located on the Left Toe Great. The wound measures 0.5cm length x 0.5cm width x 0.1cm depth; 0.196cm^2 area and 0.02cm^3 volume. There is Fat  Layer (Subcutaneous Tissue) Exposed exposed. There is no tunneling or undermining noted. There is a large amount of serosanguineous drainage noted. Foul odor after cleansing was noted. The wound margin is flat and intact. There is medium (34-66%) red granulation within the wound bed. There is a small (1-33%) amount of necrotic tissue within the wound bed including Adherent Slough. The periwound skin appearance exhibited: Maceration. The periwound skin appearance did not exhibit: Callus, Crepitus, Excoriation, Induration, Rash, Scarring, Dry/Scaly, Atrophie Blanche, Cyanosis, Ecchymosis, Hemosiderin Staining, Mottled, Pallor, Rubor, Erythema. Periwound temperature was noted as No Abnormality. Wound #6 status is Open. Original cause of wound was Gradually Appeared. The wound is located on the Left Toe Second. The wound measures 0.3cm length x 0.3cm width x 0.1cm depth; 0.071cm^2 area and 0.007cm^3 volume. There is Fat Layer (Subcutaneous Tissue) Exposed exposed. There is no tunneling or undermining noted. There is a large amount of serosanguineous drainage noted. The wound margin is flat and intact. There is large (67-100%) red granulation within the wound bed. There is a small (1-33%) amount of necrotic tissue within the wound bed including Adherent Slough. The periwound skin appearance exhibited: Maceration. The periwound skin appearance did not exhibit: Callus, Crepitus, Excoriation, Induration, Rash, Scarring, Dry/Scaly, Atrophie Blanche, Cyanosis, Ecchymosis, Hemosiderin Staining, Mottled, Pallor, Rubor, Erythema. Periwound temperature was noted as No Abnormality. Wound #9 status is Open. Original cause of wound was Gradually Appeared. The wound is located on the Right Toe Third. The wound measures 0.1cm length x 0.1cm width x 0.1cm depth; 0.008cm^2 area and 0.001cm^3 volume. There is Fat Layer (Subcutaneous Tissue) Exposed exposed. There is no tunneling or undermining noted. There is a small  amount of serous drainage noted. The wound margin is flat and intact. There is large (67-100%) red granulation within the wound bed. There is a small (1-33%) amount of necrotic tissue within the wound bed including Adherent Slough. The periwound skin appearance did not exhibit: Callus, Crepitus, Excoriation, Induration, Rash, Scarring, Dry/Scaly, Maceration, Atrophie Blanche, Cyanosis, Legacy, Maison E. ([id number]) Ecchymosis, Hemosiderin Staining, Mottled, Pallor, Rubor, Erythema. Periwound temperature was noted as No Abnormality. 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 May Shower, gently pat wound dry prior to applying new dressing. Wound #2 Right Toe Second: Clean wound with Normal Saline. May Shower, gently pat wound dry prior to applying new dressing. Wound #5 Left Toe  Great: Clean wound with Normal Saline. May Shower, gently pat wound dry prior to applying new dressing. Wound #6 Left Toe Second: Clean wound with Normal Saline. May Shower, gently pat wound dry prior to applying new dressing. Wound #9 Right Toe Third: Clean wound with Normal Saline. May Shower, gently pat wound dry prior to applying new dressing. Anesthetic (add to Medication List): Wound #1 Right Toe Great: Topical Lidocaine 4% cream applied to wound bed prior to debridement  (In Clinic Only). Wound #2 Right Toe Second: Topical Lidocaine 4% cream applied to wound bed prior to debridement (In Clinic Only). Wound #5 Left Toe Great: Topical Lidocaine 4% cream applied to wound bed prior to debridement (In Clinic Only). Wound #6 Left Toe Second: Topical Lidocaine 4% cream applied to wound bed prior to debridement (In Clinic Only). Wound #9 Right Toe Third: Topical Lidocaine 4% cream applied to wound bed prior to debridement (In Clinic Only). Primary Wound Dressing: Wound #1 Right Toe Great: Silvercel Non-Adherent Wound #2 Right Toe Second: Silvercel Non-Adherent Cheuvront, Rohnan E. ([id number]) Wound #5 Left Toe Great: Silvercel Non-Adherent Wound #6 Left Toe Second: Silvercel Non-Adherent Wound #9 Right Toe Third: Silvercel Non-Adherent Secondary Dressing: Wound #1 Right Toe Great: ABD and Kerlix/Conform Wound #2 Right Toe Second: ABD and Kerlix/Conform Wound #5 Left Toe Great: ABD and Kerlix/Conform Wound #6 Left Toe Second: ABD and Kerlix/Conform Wound #9 Right Toe Third: ABD and Kerlix/Conform Dressing Change Frequency: Wound #1 Right Toe Great: Change dressing every day. Wound #2 Right Toe Second: Change dressing every day. Wound #5 Left Toe Great: Change dressing every day. Wound #6 Left Toe Second: Change dressing every day. Wound #9 Right Toe Third: Change dressing every day. Follow-up Appointments: Wound #1 Right Toe Great: Return Appointment in 1 week. Wound #2 Right Toe Second: Return Appointment in 1 week. Wound #5 Left Toe Great: Return Appointment in 1 week. Wound #6 Left Toe Second: Return Appointment in 1 week. Wound #9 Right Toe Third: Return Appointment in 1 week. Edema Control: 3 Layer Compression System - Bilateral - unna to anchor change weekly Additional Orders / Instructions: Wound #1 Right Toe Great: Stop Smoking Increase protein intake. Other: - Please add vitamin A, vitamin C and zinc supplements to your  diet Wound #2 Right Toe Second: Stop Smoking Increase protein intake. Other: - Please add vitamin A, vitamin C and zinc supplements to your diet Wound #5 Left Toe Great: Stop Smoking Increase protein intake. Other: - Please add vitamin A, vitamin C and zinc supplements to your diet Wound #6 Left Toe Second: Stop Smoking Increase protein intake. Other: - Please add vitamin A, vitamin C and zinc supplements to your diet Wound #9 Right Toe Third: Stop Smoking Antonio, Larue E. ([id number]) Increase protein intake. Other: - Please add vitamin A, vitamin C and zinc supplements to your diet The following medication(s) was prescribed: lidocaine topical 4 % cream 1 1 cream topical was prescribed at facility 1. continue with silvercel 2. compression stockings next week 3. follow up next week Electronic Signature(s) Signed: 06/14/2017 9:06:11 PM By: Lawanda Cousins Previous Signature: 06/13/2017 10:00:10 AM Version By: Lawanda Cousins Entered By: Lawanda Cousins on 06/14/2017 21:06:11 Hardwick, [name] ([id number]) -------------------------------------------------------------------------------- ROS/PFSH Details Patient Name: Mcerlean, [name] Date of Service: 06/13/2017 9:15 AM Medical Record Number: [id number] Patient Account Number: [id number] Date of Birth/Sex: 1950/06/19 (67 y.o. Male) Treating RN: Ahmed Prima Primary Care Provider: Lamonte Sakai Other Clinician: Referring Provider: Lamonte Sakai Treating Provider/Extender: Cathie Olden in Treatment: [phone]  Information Obtained From Patient Wound History Do you currently have one or more open woundso Yes How many open wounds do you currently haveo 6 Approximately how long have you had your woundso 3 months How have you been treating your wound(s) until nowo ointment and bandage Has your wound(s) ever healed and then re-openedo No Have you had any lab work done in the past montho No Have you tested positive for an antibiotic resistant organism  (MRSA, VRE)o No Have you tested positive for osteomyelitis (bone infection)o No Have you had any tests for circulation on your legso Yes Who ordered the testo PCP Where was the test doneo [facility] Medical History: Negative for: Cataracts; Glaucoma; Optic Neuritis Ear/Nose/Mouth/Throat Medical History: Negative for: Chronic sinus problems/congestion; Middle ear problems Hematologic/Lymphatic Medical History: Positive for: Anemia; Lymphedema Negative for: Hemophilia; Human Immunodeficiency Virus; Sickle Cell Disease Respiratory Medical History: Negative for: Aspiration; Asthma; Chronic Obstructive Pulmonary Disease (COPD); Pneumothorax; Sleep Apnea; Tuberculosis Cardiovascular Medical History: Positive for: Congestive Heart Failure; Hypertension; Peripheral Venous Disease Negative for: Angina; Arrhythmia; Coronary Artery Disease; Deep Vein Thrombosis; Hypotension; Myocardial Infarction; Peripheral Arterial Disease; Phlebitis; Vasculitis Gastrointestinal Medical History: Negative for: Cirrhosis ; Colitis; Crohnos; Hepatitis A; Hepatitis B; Hepatitis C Berrones, [place] E. ([id number]) Endocrine Medical History: Positive for: Type II Diabetes Treated with: Oral agents Blood sugar tested every day: Yes Tested : QD Genitourinary Medical History: Negative for: End Stage Renal Disease Immunological Medical History: Negative for: Lupus Erythematosus; Raynaudos; Scleroderma Integumentary (Skin) Medical History: Negative for: History of Burn; History of pressure wounds Musculoskeletal Medical History: Negative for: Gout; Rheumatoid Arthritis; Osteoarthritis; Osteomyelitis Neurologic Medical History: Positive for: Neuropathy Negative for: Dementia; Quadriplegia; Paraplegia; Seizure Disorder Oncologic Medical History: Past Medical History Notes: 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 Immunizations Pneumococcal Vaccine: Received Pneumococcal Vaccination:  No Immunization Notes: up to date Implantable Devices Family and Social History Current every day smoker; Marital Status - Widowed; Alcohol Use: Daily - quit drinking about a week ago; Drug Use: No History; Caffeine Use: Moderate; Financial Concerns: No; Food, Clothing or Shelter Needs: No; Support System Lacking: No; Transportation Concerns: No; Advanced Directives: No; Patient does not want information on Advanced Directives Physician Affirmation I have reviewed and agree with the above information. Electronic Signature(s) Signed: 06/13/2017 4:33:49 PM By: Alric Quan Signed: 06/13/2017 5:22:04 PM By: Stanton Kidney ([id number]) Entered By: Lawanda Cousins on 06/13/2017 09:59:31 Deans, [name] ([id number]) -------------------------------------------------------------------------------- SuperBill Details Patient Name: Faller, [name] Date of Service: 06/13/2017 Medical Record Number: [id number] Patient Account Number: [id number] Date of Birth/Sex: 1951-01-12 (67 y.o. Male) Treating RN: Ahmed Prima Primary Care Provider: Lamonte Sakai Other Clinician: Referring Provider: Lamonte Sakai Treating Provider/Extender: Cathie Olden in Treatment: 37 Diagnosis Coding ICD-10 Codes Code Description E11.621 Type 2 diabetes mellitus with foot ulcer I87.313 Chronic venous hypertension (idiopathic) with ulcer of bilateral lower extremity I89.0 Lymphedema, not elsewhere classified L97.522 Non-pressure chronic ulcer of other part of left foot with fat layer exposed L97.512 Non-pressure chronic ulcer of other part of right foot with fat layer exposed F17.218 Nicotine dependence, cigarettes, 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 abuse 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, right ankle and foot Facility Procedures CPT4: Description Modifier Quantity Code [id number] BILATERAL: Application of multi-layer venous  compression system; leg (below 1 knee), including ankle and foot. Physician Procedures CPT4 Code Description: [id number] - WC PHYS LEVEL 3 - EST PT ICD-10 Diagnosis Description I87.313 Chronic venous hypertension (idiopathic) with ulcer of  bilateral I89.0 Lymphedema, not elsewhere classified L97.522 Non-pressure chronic ulcer of other  part of left foot with fat l L97.512 Non-pressure chronic ulcer of other part of right foot with fat Modifier: lower extremit ayer exposed layer exposed Quantity: 1 y Electronic Signature(s) Signed: 06/13/2017 11:04:10 AM By: Alric Quan Signed: 06/13/2017 5:22:04 PM By: Lawanda Cousins Previous Signature: 06/13/2017 11:03:15 AM Version By: Alric Quan Previous Signature: 06/13/2017 10:00:31 AM Version By: Lawanda Cousins Entered By: Alric Quan on 06/13/2017 11:04:09

## 2017-06-14 NOTE — Progress Notes (Signed)
06/14/2017  History of Present Illness: [name] is a 67 y.o. male who presents with some bleeding from perianal area yesterday.  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 Baruch Gouty.  He reports he's been doing well but yesterday was having some discomfort in the perianal area and he scratched that area and started bleeding.  He washed it out and placed gauze over it.  This morning the bleeding had stopped and he presents for further evaluation.   Past Medical History: Past Medical History:  Diagnosis Date  . Anemia   . CHF (congestive heart failure) ([place])   . Diabetes mellitus    Patient take Metformin.  Marland Kitchen GERD (gastroesophageal reflux disease)   . Hypertension   . Leg swelling   . Loose, teeth    PATIENT STATES "HAS 6 TEETH, SOME ARE LOOSE"  . Neuromuscular disorder ([place])    RIGHT HAND AND FINGERS NUMB  . Shortness of breath dyspnea   . 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 APPENDECTOMY    . COLONOSCOPY WITH PROPOFOL N/A 11/19/2014   Procedure: COLONOSCOPY WITH PROPOFOL;  Surgeon: Lucilla Lame, MD;  Location: [place];  Service: Endoscopy;  Laterality: N/A;  DIABETIC-ORAL MEDS  . COLONOSCOPY WITH PROPOFOL N/A 10/04/2016   Procedure: COLONOSCOPY WITH PROPOFOL;  Surgeon: Jonathon Bellows, MD;  Location[facility]COPY;  Service: Endoscopy;  Laterality: N/A;  . ESOPHAGOGASTRODUODENOSCOPY (EGD) WITH PROPOFOL N/A 10/04/2016   Procedure: ESOPHAGOGASTRODUODENOSCOPY (EGD) WITH PROPOFOL;  Surgeon: Jonathon Bellows, MD;  Location: [facility] ENDOSCOPY;  Service: Endoscopy;  Laterality: N/A;  . GIVENS CAPSULE STUDY N/A 11/13/2016   Procedure: GIVENS CAPSULE STUDY;  Surgeon: Jonathon Bellows, MD;  Location: [facility] ENDOSCOPY;  Service: Gastroenterology;  Laterality: N/A;  . INGUINAL LYMPH NODE BIOPSY Right 01/26/2016   Procedure: INGUINAL LYMPH NODE BIOPSY;  Surgeon: Clayburn Pert, MD;  Location: [facility] ORS;  Service: General;  Laterality: Right;  . RECTAL BIOPSY N/A 11/02/2014   Procedure: BIOPSY RECTAL;  Surgeon: Marlyce Huge, MD;  Location: [facility] ORS;  Service: General;  Laterality: N/A;  . RECTAL EXAM UNDER ANESTHESIA N/A 11/02/2014   Procedure: RECTAL EXAM UNDER ANESTHESIA;  Surgeon: Marlyce Huge, MD;  Location: [facility] ORS;  Service: General;  Laterality: N/A;  . TONSILLECTOMY      Home Medications: Prior to Admission medications   Medication Sig Start Date End Date Taking? Authorizing Provider  amLODipine (NORVASC) 10 MG tablet Take by mouth. 01/26/16   [provider]  aspirin EC 81 MG tablet Take 81 mg by mouth daily.    [provider]  atorvastatin (LIPITOR) 40 MG tablet Take 40 mg by mouth at bedtime.    [provider]  chlorthalidone (HYGROTON) 25 MG tablet Take by mouth.    [provider]  ferrous sulfate 325 (65 FE) MG tablet Take by mouth.    [provider]  hydrALAZINE (APRESOLINE) 25 MG tablet Take 25 mg 2 (two) times daily by mouth.    [provider]  omeprazole (PRILOSEC) 40 MG capsule Take by mouth.    [provider]  polyethylene glycol (MIRALAX / GLYCOLAX) packet Take by mouth.    [provider]  vitamin B-12 (CYANOCOBALAMIN) 1000 MCG tablet Take 1 tablet (1,000 mcg total) by mouth daily. 06/03/17   Sindy Guadeloupe, MD    Allergies: No Known Allergies  Review  of Systems: Review of Systems  Constitutional: Negative for chills and fever.  Respiratory: Negative for shortness of breath.   Cardiovascular: Negative for chest pain.  Gastrointestinal: Negative for abdominal pain, nausea and vomiting.  Skin: Negative for rash.  All other systems reviewed and are negative.   Physical Exam BP (!) 177/92   Pulse 71   Temp 97.8 F (36.6 C) (Oral)   Ht 5\' 9"  (1.753 m)   Wt 78.5 kg (173 lb)   BMI 25.55 kg/m  CONSTITUTIONAL: No acute  distress RESPIRATORY:  Lungs are clear, and breath sounds are equal bilaterally. Normal respiratory effort without pathologic use of accessory muscles. CARDIOVASCULAR: Heart is regular without murmurs, gallops, or rubs. GI: The abdomen is soft, nondistended, nontender.  RECTAL:  Patient has right perianal lesion measuring 2x3 cm consistent with his history of SCC.  Unclear as to whether this lesion is growing, same size, or what exact changes have happened.  The patient is unsure.  There is no bleeding, erythema, induration, or evidence of infection. MUSCULOSKELETAL:  Normal muscle strength and tone in all four extremities.  No peripheral edema or cyanosis. SKIN: Skin turgor is normal. There are no pathologic skin lesions.  NEUROLOGIC:  Motor and sensation is grossly normal.  Cranial nerves are grossly intact. PSYCH:  Alert and oriented to person, place and time. Affect is normal.  Labs/Imaging: None recently  Assessment and Plan: This is a 67 y.o. male who presents with bleeding from a perianal lesion, with history of SCC  --no further bleeding at this point.  It is unclear what the progression of this lesion is and whether it is smaller, bigger, or same size as before.  There is no surgical need at this point from the bleeding standpoint, but given the lesion, would have him follow up with his oncology team for further evaluation.  Discussed with patient that he should avoid scratching the area or irritating it to avoid any further bleeding.   Melvyn Neth, [place]

## 2017-06-14 NOTE — Patient Instructions (Signed)
We have sent a message to Dr. Elroy Channel office for you to be seen. They should contact you within 5-7 business days. If you do not hear from them please give our office a call.

## 2017-06-14 NOTE — Telephone Encounter (Signed)
Pt family member called to say the area on his buttock that was treated last year has begun to bleed again.  Discussed this with Dr. Baruch Gouty and he advised him to follow up with the surgeon.   It looks like the patient had seen Dr. Adonis Huguenin last year and the phone number to [facility] was given to the family.   [name] voiced understanding and read back the phone number.

## 2017-06-14 NOTE — Progress Notes (Signed)
LELAN, CUSH ([id number]) Visit Report for 06/13/2017 Arrival Information Details Patient Name: Barefield, AMATO SEVILLANO. Date of Service: 06/13/2017 9:15 AM Medical Record Number: [id number] Patient Account Number: [id number] Date of Birth/Sex: September 22, 1950 (67 y.o. Male) Treating RN: Montey Hora Primary Care Tamea Bai: Lamonte Sakai Other Clinician: Referring Perez Dirico: Lamonte Sakai Treating Alexanderia Gorby/Extender: Cathie Olden in Treatment: 13 Visit Information History Since Last Visit Added or deleted any medications: No Patient Arrived: Ambulatory Any new allergies or adverse reactions: No Arrival Time: 09:23 Had a fall or experienced change in No Accompanied By: brother activities of daily living that may affect Transfer Assistance: None risk of falls: Patient Identification Verified: Yes Signs or symptoms of abuse/neglect since last visito No Secondary Verification Process Completed: Yes Hospitalized since last visit: No Patient Requires Transmission-Based No Has Dressing in Place as Prescribed: Yes Precautions: Has Compression in Place as Prescribed: No Patient Has Alerts: Yes Pain Present Now: No Patient Alerts: DMII Electronic Signature(s) Signed: 06/13/2017 5:02:17 PM By: Montey Hora Entered By: Montey Hora on 06/13/2017 09:23:54 Schuessler, Jazmin Johnette Abraham ([id number]) -------------------------------------------------------------------------------- Encounter Discharge Information Details Patient Name: Oo, Molly E. Date of Service: 06/13/2017 9:15 AM Medical Record Number: [id number] Patient Account Number: [id number] Date of Birth/Sex: 1951/02/13 (66 y.o. Male) Treating RN: Ahmed Prima Primary Care Renell Coaxum: Lamonte Sakai Other Clinician: Referring Janus Vlcek: Lamonte Sakai Treating Karlea Mckibbin/Extender: Cathie Olden in Treatment: 37 Encounter Discharge Information Items Discharge Pain Level: 0 Discharge Condition: Stable Ambulatory Status: Ambulatory Discharge  Destination: Home Transportation: Private Auto Accompanied By: brother n law Schedule Follow-up Appointment: Yes Medication Reconciliation completed and No provided to Patient/Care Kalyn Hofstra: Provided on Clinical Summary of Care: 06/13/2017 Form Type Recipient Paper Patient ED Electronic Signature(s) Signed: 06/13/2017 4:51:26 PM By: Roger Shelter Entered By: Roger Shelter on 06/13/2017 10:20:26 Haston, Supreme EMarland Kitchen ([id number]) -------------------------------------------------------------------------------- Lower Extremity Assessment Details Patient Name: Sterbenz, Effie E. Date of Service: 06/13/2017 9:15 AM Medical Record Number: [id number] Patient Account Number: [id number] Date of Birth/Sex: 04-Feb-1951 (66 y.o. Male) Treating RN: Montey Hora Primary Care Amaro Mangold: Lamonte Sakai Other Clinician: Referring Skyleen Bentley: Lamonte Sakai Treating Prisila Dlouhy/Extender: Cathie Olden in Treatment: 37 Edema Assessment Assessed: [Left: No] [Right: No] [Left: Edema] [Right: :] Calf Left: Right: Point of Measurement: 34 cm From Medial Instep 33.2 cm 34.5 cm Ankle Left: Right: Point of Measurement: 12 cm From Medial Instep 23 cm 23.5 cm Vascular Assessment Pulses: Dorsalis Pedis Palpable: [Left:Yes] [Right:Yes] Posterior Tibial Extremity colors, hair growth, and conditions: Extremity Color: [Left:Hyperpigmented] [Right:Hyperpigmented] Hair Growth on Extremity: [Left:No] [Right:No] Temperature of Extremity: [Left:Warm] [Right:Warm] Capillary Refill: [Left:< 3 seconds] [Right:< 3 seconds] Notes Heel to knee- 48 bilaterally Electronic Signature(s) Signed: 06/13/2017 4:33:49 PM By: Alric Quan Signed: 06/13/2017 5:02:17 PM By: Montey Hora Entered By: Alric Quan on 06/13/2017 09:59:25 Morrill, Mc E. ([id number]) -------------------------------------------------------------------------------- Multi Wound Chart Details Patient Name: Reppert, Humphrey E. Date of Service: 06/13/2017  9:15 AM Medical Record Number: [id number] Patient Account Number: [id number] Date of Birth/Sex: 1950/04/26 (66 y.o. Male) Treating RN: Ahmed Prima Primary Care Brinn Westby: Lamonte Sakai Other Clinician: Referring Karmel Patricelli: Lamonte Sakai Treating Rainelle Sulewski/Extender: Cathie Olden in Treatment: 37 Vital Signs Height(in): 58 Pulse(bpm): 61 Weight(lbs): 168 Blood Pressure(mmHg): 148/86 Body Mass Index(BMI): 25 Temperature(F): 98.3 Respiratory Rate 18 (breaths/min): Photos: [1:No Photos] [2:No Photos] [5:No Photos] Wound Location: [1:Right Toe Great] [2:Right Toe Second] [5:Left Toe Great] Wounding Event: [1:Gradually Appeared] [2:Gradually Appeared] [5:Gradually Appeared] Primary Etiology: [1:Diabetic Wound/Ulcer of the Lower Extremity] [2:Diabetic Wound/Ulcer of the Lower Extremity] [5:Diabetic Wound/Ulcer of the Lower Extremity] Comorbid History: [1:Anemia,  Lymphedema, Congestive Heart Failure, Hypertension, Peripheral Venous Disease, Type II Diabetes, Neuropathy] [2:Anemia, Lymphedema, Congestive Heart Failure, Hypertension, Peripheral Venous Disease, Type II Diabetes,  Neuropathy] [5:Anemia, Lymphedema, Congestive Heart Failure, Hypertension, Peripheral Venous Disease, Type II Diabetes, Neuropathy] Date Acquired: [1:06/11/2016] [2:06/11/2016] [5:06/11/2016] Weeks of Treatment: [1:37] [2:37] [5:37] Wound Status: [1:Open] [2:Open] [5:Open] Pending Amputation on [1:Yes] [2:Yes] [5:Yes] Presentation: Measurements L x W x D [1:1x4x0.1] [2:0.6x1.2x0.1] [5:0.5x0.5x0.1] (cm) Area (cm) : [1:3.142] [2:0.565] [5:0.196] Volume (cm) : [1:0.314] [2:0.057] [5:0.02] % Reduction in Area: [1:93.30%] [2:81.60%] [5:98.60%] % Reduction in Volume: [1:93.30%] [2:81.40%] [5:98.60%] Classification: [1:Grade 2] [2:Grade 2] [5:Grade 2] Exudate Amount: [1:Large] [2:Large] [5:Large] Exudate Type: [1:Serosanguineous] [2:Serosanguineous] [5:Serosanguineous] Exudate Color: [1:red, brown] [2:red, brown]  [5:red, brown] Foul Odor After Cleansing: [1:No] [2:No] [5:Yes] Odor Anticipated Due to [1:N/A] [2:N/A] [5:No] Product Use: Wound Margin: [1:Flat and Intact] [2:Flat and Intact] [5:Flat and Intact] Granulation Amount: [1:Large (67-100%)] [2:Large (67-100%)] [5:Medium (34-66%)] Granulation Quality: [1:Red] [2:Red] [5:Red] Necrotic Amount: [1:Small (1-33%)] [2:Small (1-33%)] [5:Small (1-33%)] Necrotic Tissue: [1:Eschar, Adherent Slough] [2:Adherent Slough] [5:Adherent Slough] Exposed Structures: [1:Fat Layer (Subcutaneous Tissue) Exposed: Yes Fascia: No Tendon: No] [2:Fat Layer (Subcutaneous Tissue) Exposed: Yes Fascia: No Tendon: No] [5:Fat Layer (Subcutaneous Tissue) Exposed: Yes Fascia: No Tendon: No] Muscle: No Muscle: No Muscle: No Joint: No Joint: No Joint: No Bone: No Bone: No Bone: No Epithelialization: Small (1-33%) Medium (34-66%) Small (1-33%) Periwound Skin Texture: Excoriation: Yes Scarring: Yes Excoriation: No Callus: Yes Excoriation: No Induration: No Induration: No Induration: No Callus: No Crepitus: No Callus: No Crepitus: No Rash: No Crepitus: No Rash: No Scarring: No Rash: No Scarring: No Periwound Skin Moisture: Maceration: Yes Maceration: Yes Maceration: Yes Dry/Scaly: No Dry/Scaly: No Dry/Scaly: No Periwound Skin Color: Atrophie Blanche: No Atrophie Blanche: No Atrophie Blanche: No Cyanosis: No Cyanosis: No Cyanosis: No Ecchymosis: No Ecchymosis: No Ecchymosis: No Erythema: No Erythema: No Erythema: No Hemosiderin Staining: No Hemosiderin Staining: No Hemosiderin Staining: No Mottled: No Mottled: No Mottled: No Pallor: No Pallor: No Pallor: No Rubor: No Rubor: No Rubor: No Temperature: No Abnormality No Abnormality No Abnormality Tenderness on Palpation: No No No Wound Preparation: Ulcer Cleansing: Ulcer Cleansing: Ulcer Cleansing: Rinsed/Irrigated with Saline Rinsed/Irrigated with Saline Rinsed/Irrigated with  Saline Topical Anesthetic Applied: Topical Anesthetic Applied: Topical Anesthetic Applied: Other: lidocaine 4% Other: lidocaine 4% Other: lidocaine 4% Wound Number: 6 9 N/A Photos: No Photos No Photos N/A Wound Location: Left Toe Second Right Toe Third N/A Wounding Event: Gradually Appeared Gradually Appeared N/A Primary Etiology: Diabetic Wound/Ulcer of the Diabetic Wound/Ulcer of the N/A Lower Extremity Lower Extremity Comorbid History: Anemia, Lymphedema, Anemia, Lymphedema, N/A Congestive Heart Failure, Congestive Heart Failure, Hypertension, Peripheral Hypertension, Peripheral Venous Disease, Type II Venous Disease, Type II Diabetes, Neuropathy Diabetes, Neuropathy Date Acquired: 06/11/2016 12/31/2016 N/A Weeks of Treatment: 37 23 N/A Wound Status: Open Open N/A Pending Amputation on Yes No N/A Presentation: Measurements L x W x D 0.3x0.3x0.1 0.1x0.1x0.1 N/A (cm) Area (cm) : 0.071 0.008 N/A Volume (cm) : 0.007 0.001 N/A % Reduction in Area: 96.70% 98.00% N/A % Reduction in Volume: 96.70% 97.40% N/A Classification: Grade 2 Grade 2 N/A Exudate Amount: Large Small N/A Exudate Type: Serosanguineous Serous N/A Exudate Color: red, brown amber N/A Foul Odor After Cleansing: No No N/A Odor Anticipated Due to N/A N/A N/A Product Use: Wound Margin: Flat and Intact Flat and Intact N/A Strauss, Shean E. ([id number]) Granulation Amount: Large (67-100%) Large (67-100%) N/A Granulation Quality: Red Red N/A Necrotic Amount: Small (1-33%) Small (1-33%) N/A Necrotic Tissue:  Dent N/A Exposed Structures: Fat Layer (Subcutaneous Fat Layer (Subcutaneous N/A Tissue) Exposed: Yes Tissue) Exposed: Yes Fascia: No Fascia: No Tendon: No Tendon: No Muscle: No Muscle: No Joint: No Joint: No Bone: No Bone: No Epithelialization: Small (1-33%) None N/A Periwound Skin Texture: Excoriation: No Excoriation: No N/A Induration: No Induration: No Callus: No Callus:  No Crepitus: No Crepitus: No Rash: No Rash: No Scarring: No Scarring: No Periwound Skin Moisture: Maceration: Yes Maceration: No N/A Dry/Scaly: No Dry/Scaly: No Periwound Skin Color: Atrophie Blanche: No Atrophie Blanche: No N/A Cyanosis: No Cyanosis: No Ecchymosis: No Ecchymosis: No Erythema: No Erythema: No Hemosiderin Staining: No Hemosiderin Staining: No Mottled: No Mottled: No Pallor: No Pallor: No Rubor: No Rubor: No Temperature: No Abnormality No Abnormality N/A Tenderness on Palpation: No No N/A Wound Preparation: Ulcer Cleansing: Ulcer Cleansing: N/A Rinsed/Irrigated with Saline Rinsed/Irrigated with Saline Topical Anesthetic Applied: Topical Anesthetic Applied: Other: lidocaine 4% Other: lidocaine 4% Treatment Notes Electronic Signature(s) Signed: 06/13/2017 9:56:00 AM By: Lawanda Cousins Entered By: Lawanda Cousins on 06/13/2017 09:56:00 Vanbergen, [name] ([id number]) -------------------------------------------------------------------------------- [place] Details Patient Name: Giorgio, Jarel E. Date of Service: 06/13/2017 9:15 AM Medical Record Number: [id number] Patient Account Number: [id number] Date of Birth/Sex: [birth year] (67 y.o. Male) Treating RN: Ahmed Prima Primary Care Dereon Corkery: Lamonte Sakai Other Clinician: Referring Sophie Tamez: Lamonte Sakai Treating Anglia Blakley/Extender: Cathie Olden in Treatment: 76 Active Inactive ` Abuse / Safety / Falls / Self Care Management Nursing Diagnoses: Potential for falls Goals: Patient will remain injury free related to falls Date Initiated: 09/21/2016 Target Resolution Date: 09/14/2017 Goal Status: Active Interventions: Assess fall risk on admission and as needed Notes: ` Nutrition Nursing Diagnoses: Potential for alteratiion in Nutrition/Potential for imbalanced nutrition Goals: Patient/caregiver agrees to and verbalizes understanding of need to use nutritional supplements and/or  vitamins as prescribed Date Initiated: 09/21/2016 Target Resolution Date: 09/14/2017 Goal Status: Active Interventions: Assess patient nutrition upon admission and as needed per policy Notes: ` Orientation to the Wound Care Program Nursing Diagnoses: Knowledge deficit related to the wound healing center program Goals: Patient/caregiver will verbalize understanding of the [place] Program Date Initiated: 09/21/2016 Target Resolution Date: 06/15/2017 Goal Status: Active Interventions: DARKThao, Vanover ([id number]) Provide education on orientation to the wound center Notes: ` Wound/Skin Impairment Nursing Diagnoses: Knowledge deficit related to smoking impact on wound healing Goals: Ulcer/skin breakdown will have a volume reduction of 30% by week 4 Date Initiated: 09/21/2016 Target Resolution Date: 08/17/2017 Goal Status: Active Ulcer/skin breakdown will have a volume reduction of 50% by week 8 Date Initiated: 09/21/2016 Target Resolution Date: 08/17/2017 Goal Status: Active Ulcer/skin breakdown will have a volume reduction of 80% by week 12 Date Initiated: 09/21/2016 Target Resolution Date: 09/14/2017 Goal Status: Active Ulcer/skin breakdown will heal within 14 weeks Date Initiated: 09/21/2016 Target Resolution Date: 08/17/2017 Goal Status: Active Interventions: Assess patient/caregiver ability to obtain necessary supplies Assess patient/caregiver ability to perform ulcer/skin care regimen upon admission and as needed Assess ulceration(s) every visit Notes: Electronic Signature(s) Signed: 06/13/2017 4:33:49 PM By: Alric Quan Entered By: Alric Quan on 06/13/2017 09:49:28 Seiden, Toddrick E. ([id number]) -------------------------------------------------------------------------------- Pain Assessment Details Patient Name: Marszalek, Teron E. Date of Service: 06/13/2017 9:15 AM Medical Record Number: [id number] Patient Account Number: [id number] Date of Birth/Sex: 15-Feb-1951  (67 y.o. Male) Treating RN: Montey Hora Primary Care Issachar Broady: Lamonte Sakai Other Clinician: Referring Marzelle Rutten: Lamonte Sakai Treating Malike Foglio/Extender: Cathie Olden in Treatment: 37 Active Problems Location of Pain Severity and Description of Pain Patient Has  Paino Yes Site Locations Pain Location: Pain in Ulcers With Dressing Change: No Duration of the Pain. Constant / Intermittento Intermittent Pain Management and Medication Current Pain Management: Goals for Pain Management top of right foot acts up sometimes Notes Topical or injectable lidocaine is offered to patient for acute pain when surgical debridement is performed. If needed, Patient is instructed to use over the counter pain medication for the following 24-48 hours after debridement. Wound care MDs do not prescribed pain medications. Patient has chronic pain or uncontrolled pain. Patient has been instructed to make an appointment with their Primary Care Physician for pain management. Electronic Signature(s) Signed: 06/13/2017 5:02:17 PM By: Montey Hora Entered By: Montey Hora on 06/13/2017 09:24:29 Crill, [name] ([id number]) -------------------------------------------------------------------------------- Patient/Caregiver Education Details Patient Name: Credit, Clayborne E. Date of Service: 06/13/2017 9:15 AM Medical Record Number: [id number] Patient Account Number: [id number] Date of Birth/Gender: Dec 13, 1950 (67 y.o. Male) Treating RN: Roger Shelter Primary Care Physician: Lamonte Sakai Other Clinician: Referring Physician: Lamonte Sakai Treating Physician/Extender: Cathie Olden in Treatment: 29 Education Assessment Education Provided To: Patient Education Topics Provided Wound/Skin Impairment: Handouts: Caring for Your Ulcer Methods: Explain/Verbal Responses: State content correctly Electronic Signature(s) Signed: 06/13/2017 4:51:26 PM By: Roger Shelter Entered By: Roger Shelter on  06/13/2017 10:20:44 Balderston, [name] ([id number]) -------------------------------------------------------------------------------- Wound Assessment Details Patient Name: Woolverton, Demeco E. Date of Service: 06/13/2017 9:15 AM Medical Record Number: [id number] Patient Account Number: [id number] Date of Birth/Sex: May 23, 1950 (67 y.o. Male) Treating RN: Montey Hora Primary Care Torrell Krutz: Lamonte Sakai Other Clinician: Referring Rayelynn Loyal: Lamonte Sakai Treating Donetta Isaza/Extender: Cathie Olden in Treatment: 52 Wound Status Wound Number: 1 Primary Diabetic Wound/Ulcer of the Lower Extremity Etiology: Wound Location: Right Toe Great Wound Open Wounding Event: Gradually Appeared Status: Date Acquired: 06/11/2016 Comorbid Anemia, Lymphedema, Congestive Heart Weeks Of Treatment: 37 History: Failure, Hypertension, Peripheral Venous Clustered Wound: No Disease, Type II Diabetes, Neuropathy Pending Amputation On Presentation Photos Photo Uploaded By: Montey Hora on 06/13/2017 10:32:15 Wound Measurements Length: (cm) 1 Width: (cm) 4 Depth: (cm) 0.1 Area: (cm) 3.142 Volume: (cm) 0.314 % Reduction in Area: 93.3% % Reduction in Volume: 93.3% Epithelialization: Small (1-33%) Tunneling: No Undermining: No Wound Description Classification: Grade 2 Foul O Wound Margin: Flat and Intact Slough Exudate Amount: Large Exudate Type: Serosanguineous Exudate Color: red, brown dor After Cleansing: No /Fibrino No Wound Bed Granulation Amount: Large (67-100%) Exposed Structure Granulation Quality: Red Fascia Exposed: No Necrotic Amount: Small (1-33%) Fat Layer (Subcutaneous Tissue) Exposed: Yes Necrotic Quality: Eschar, Adherent Slough Tendon Exposed: No Muscle Exposed: No Joint Exposed: No Bone Exposed: No Periwound Skin Texture Sargent, Haris E. ([id number]) Texture Color No Abnormalities Noted: No No Abnormalities Noted: No Callus: Yes Atrophie Blanche: No Crepitus: No Cyanosis:  No Excoriation: Yes Ecchymosis: No Induration: No Erythema: No Rash: No Hemosiderin Staining: No Scarring: No Mottled: No Pallor: No Moisture Rubor: No No Abnormalities Noted: No Dry / Scaly: No Temperature / Pain Maceration: Yes Temperature: No Abnormality Wound Preparation Ulcer Cleansing: Rinsed/Irrigated with Saline Topical Anesthetic Applied: Other: lidocaine 4%, Treatment Notes Wound #1 (Right Toe Great) 1. Cleansed with: Clean wound with Normal Saline 2. Anesthetic Topical Lidocaine 4% cream to wound bed prior to debridement 3. Peri-wound Care: Moisturizing lotion 4. Dressing Applied: Other dressing (specify in notes) 5. Secondary Dressing Applied ABD Pad 7. Secured with 3 Layer Compression System - Bilateral Notes kerlix wrap Electronic Signature(s) Signed: 06/13/2017 5:02:17 PM By: Montey Hora Entered By: Montey Hora on 06/13/2017 09:38:21 Devery, Laray E. ([id number]) -------------------------------------------------------------------------------- Wound  Assessment Details Patient Name: Dejaynes, JAME MORRELL. Date of Service: 06/13/2017 9:15 AM Medical Record Number: [id number] Patient Account Number: [id number] Date of Birth/Sex: [birth year] (67 y.o. Male) Treating RN: Montey Hora Primary Care Tania Perrott: Lamonte Sakai Other Clinician: Referring Dorion Petillo: Lamonte Sakai Treating Camrie Stock/Extender: Cathie Olden in Treatment: 16 Wound Status Wound Number: 2 Primary Diabetic Wound/Ulcer of the Lower Extremity Etiology: Wound Location: Right Toe Second Wound Open Wounding Event: Gradually Appeared Status: Date Acquired: 06/11/2016 Comorbid Anemia, Lymphedema, Congestive Heart Weeks Of Treatment: 37 History: Failure, Hypertension, Peripheral Venous Clustered Wound: No Disease, Type II Diabetes, Neuropathy Pending Amputation On Presentation Photos Photo Uploaded By: Montey Hora on 06/13/2017 10:31:53 Wound Measurements Length: (cm) 0.6 Width: (cm)  1.2 Depth: (cm) 0.1 Area: (cm) 0.565 Volume: (cm) 0.057 % Reduction in Area: 81.6% % Reduction in Volume: 81.4% Epithelialization: Medium (34-66%) Tunneling: No Undermining: No Wound Description Classification: Grade 2 Foul O Wound Margin: Flat and Intact Slough Exudate Amount: Large Exudate Type: Serosanguineous Exudate Color: red, brown dor After Cleansing: No /Fibrino No Wound Bed Granulation Amount: Large (67-100%) Exposed Structure Granulation Quality: Red Fascia Exposed: No Necrotic Amount: Small (1-33%) Fat Layer (Subcutaneous Tissue) Exposed: Yes Necrotic Quality: Adherent Slough Tendon Exposed: No Muscle Exposed: No Joint Exposed: No Bone Exposed: No Periwound Skin Texture Hinzman, Dorthy E. ([id number]) Texture Color No Abnormalities Noted: No No Abnormalities Noted: No Callus: No Atrophie Blanche: No Crepitus: No Cyanosis: No Excoriation: No Ecchymosis: No Induration: No Erythema: No Rash: No Hemosiderin Staining: No Scarring: Yes Mottled: No Pallor: No Moisture Rubor: No No Abnormalities Noted: No Dry / Scaly: No Temperature / Pain Maceration: Yes Temperature: No Abnormality Wound Preparation Ulcer Cleansing: Rinsed/Irrigated with Saline Topical Anesthetic Applied: Other: lidocaine 4%, Treatment Notes Wound #2 (Right Toe Second) 1. Cleansed with: Clean wound with Normal Saline 2. Anesthetic Topical Lidocaine 4% cream to wound bed prior to debridement 3. Peri-wound Care: Moisturizing lotion 4. Dressing Applied: Other dressing (specify in notes) 5. Secondary Dressing Applied ABD Pad 7. Secured with 3 Layer Compression System - Bilateral Notes kerlix wrap Electronic Signature(s) Signed: 06/13/2017 5:02:17 PM By: Montey Hora Entered By: Montey Hora on 06/13/2017 09:38:39 Tourigny, [name] ([id number]) -------------------------------------------------------------------------------- Wound Assessment Details Patient Name: Hammers,  Schuyler E. Date of Service: 06/13/2017 9:15 AM Medical Record Number: [id number] Patient Account Number: [id number] Date of Birth/Sex: 10-Feb-1951 (67 y.o. Male) Treating RN: Montey Hora Primary Care Wylma Tatem: Lamonte Sakai Other Clinician: Referring Javeion Cannedy: Lamonte Sakai Treating Wendell Nicoson/Extender: Cathie Olden in Treatment: 58 Wound Status Wound Number: 5 Primary Diabetic Wound/Ulcer of the Lower Extremity Etiology: Wound Location: Left Toe Great Wound Open Wounding Event: Gradually Appeared Status: Date Acquired: 06/11/2016 Comorbid Anemia, Lymphedema, Congestive Heart Weeks Of Treatment: 37 History: Failure, Hypertension, Peripheral Venous Clustered Wound: No Disease, Type II Diabetes, Neuropathy Pending Amputation On Presentation Photos Photo Uploaded By: Montey Hora on 06/13/2017 10:32:40 Wound Measurements Length: (cm) 0.5 Width: (cm) 0.5 Depth: (cm) 0.1 Area: (cm) 0.196 Volume: (cm) 0.02 % Reduction in Area: 98.6% % Reduction in Volume: 98.6% Epithelialization: Small (1-33%) Tunneling: No Undermining: No Wound Description Classification: Grade 2 Wound Margin: Flat and Intact Exudate Amount: Large Exudate Type: Serosanguineous Exudate Color: red, brown Foul Odor After Cleansing: Yes Due to Product Use: No Slough/Fibrino No Wound Bed Granulation Amount: Medium (34-66%) Exposed Structure Granulation Quality: Red Fascia Exposed: No Necrotic Amount: Small (1-33%) Fat Layer (Subcutaneous Tissue) Exposed: Yes Necrotic Quality: Adherent Slough Tendon Exposed: No Muscle Exposed: No Joint Exposed: No Bone Exposed: No Periwound Skin Texture Shawhan,  Shahiem E. ([id number]) Texture Color No Abnormalities Noted: No No Abnormalities Noted: No Callus: No Atrophie Blanche: No Crepitus: No Cyanosis: No Excoriation: No Ecchymosis: No Induration: No Erythema: No Rash: No Hemosiderin Staining: No Scarring: No Mottled: No Pallor: No Moisture Rubor:  No No Abnormalities Noted: No Dry / Scaly: No Temperature / Pain Maceration: Yes Temperature: No Abnormality Wound Preparation Ulcer Cleansing: Rinsed/Irrigated with Saline Topical Anesthetic Applied: Other: lidocaine 4%, Treatment Notes Wound #5 (Left Toe Great) 1. Cleansed with: Clean wound with Normal Saline 2. Anesthetic Topical Lidocaine 4% cream to wound bed prior to debridement 3. Peri-wound Care: Moisturizing lotion 4. Dressing Applied: Other dressing (specify in notes) 5. Secondary Dressing Applied ABD Pad 7. Secured with 3 Layer Compression System - Bilateral Notes kerlix wrap Electronic Signature(s) Signed: 06/13/2017 5:02:17 PM By: Montey Hora Entered By: Montey Hora on 06/13/2017 09:38:53 Fentress, [name] ([id number]) -------------------------------------------------------------------------------- Wound Assessment Details Patient Name: Suit, Seanpatrick E. Date of Service: 06/13/2017 9:15 AM Medical Record Number: [id number] Patient Account Number: [id number] Date of Birth/Sex: March 21, 1951 (67 y.o. Male) Treating RN: Montey Hora Primary Care Evalynne Locurto: Lamonte Sakai Other Clinician: Referring Kassidee Narciso: Lamonte Sakai Treating Marialena Wollen/Extender: Cathie Olden in Treatment: 80 Wound Status Wound Number: 6 Primary Diabetic Wound/Ulcer of the Lower Extremity Etiology: Wound Location: Left Toe Second Wound Open Wounding Event: Gradually Appeared Status: Date Acquired: 06/11/2016 Comorbid Anemia, Lymphedema, Congestive Heart Weeks Of Treatment: 37 History: Failure, Hypertension, Peripheral Venous Clustered Wound: No Disease, Type II Diabetes, Neuropathy Pending Amputation On Presentation Photos Photo Uploaded By: Montey Hora on 06/13/2017 10:32:40 Wound Measurements Length: (cm) 0.3 Width: (cm) 0.3 Depth: (cm) 0.1 Area: (cm) 0.071 Volume: (cm) 0.007 % Reduction in Area: 96.7% % Reduction in Volume: 96.7% Epithelialization: Small  (1-33%) Tunneling: No Undermining: No Wound Description Classification: Grade 2 Foul O Wound Margin: Flat and Intact Slough Exudate Amount: Large Exudate Type: Serosanguineous Exudate Color: red, brown dor After Cleansing: No /Fibrino No Wound Bed Granulation Amount: Large (67-100%) Exposed Structure Granulation Quality: Red Fascia Exposed: No Necrotic Amount: Small (1-33%) Fat Layer (Subcutaneous Tissue) Exposed: Yes Necrotic Quality: Adherent Slough Tendon Exposed: No Muscle Exposed: No Joint Exposed: No Bone Exposed: No Periwound Skin Texture Rapaport, Malaquias E. ([id number]) Texture Color No Abnormalities Noted: No No Abnormalities Noted: No Callus: No Atrophie Blanche: No Crepitus: No Cyanosis: No Excoriation: No Ecchymosis: No Induration: No Erythema: No Rash: No Hemosiderin Staining: No Scarring: No Mottled: No Pallor: No Moisture Rubor: No No Abnormalities Noted: No Dry / Scaly: No Temperature / Pain Maceration: Yes Temperature: No Abnormality Wound Preparation Ulcer Cleansing: Rinsed/Irrigated with Saline Topical Anesthetic Applied: Other: lidocaine 4%, Treatment Notes Wound #6 (Left Toe Second) 1. Cleansed with: Clean wound with Normal Saline 2. Anesthetic Topical Lidocaine 4% cream to wound bed prior to debridement 3. Peri-wound Care: Moisturizing lotion 4. Dressing Applied: Other dressing (specify in notes) 5. Secondary Dressing Applied ABD Pad 7. Secured with 3 Layer Compression System - Bilateral Notes kerlix wrap Electronic Signature(s) Signed: 06/13/2017 5:02:17 PM By: Montey Hora Entered By: Montey Hora on 06/13/2017 09:39:10 Kuenzel, [name] ([id number]) -------------------------------------------------------------------------------- Wound Assessment Details Patient Name: Denning, Nohlan E. Date of Service: 06/13/2017 9:15 AM Medical Record Number: [id number] Patient Account Number: [id number] Date of Birth/Sex: 18-Jul-1950 (67 y.o.  Male) Treating RN: Montey Hora Primary Care Ameria Sanjurjo: Lamonte Sakai Other Clinician: Referring Keily Lepp: Lamonte Sakai Treating Latise Dilley/Extender: Cathie Olden in Treatment: 37 Wound Status Wound Number: 9 Primary Diabetic Wound/Ulcer of the Lower Extremity Etiology: Wound Location: Right Toe Third Wound  Open Wounding Event: Gradually Appeared Status: Date Acquired: 12/31/2016 Comorbid Anemia, Lymphedema, Congestive Heart Weeks Of Treatment: 23 History: Failure, Hypertension, Peripheral Venous Clustered Wound: No Disease, Type II Diabetes, Neuropathy Photos Photo Uploaded By: Montey Hora on 06/13/2017 10:33:16 Wound Measurements Length: (cm) 0.1 Width: (cm) 0.1 Depth: (cm) 0.1 Area: (cm) 0.008 Volume: (cm) 0.001 % Reduction in Area: 98% % Reduction in Volume: 97.4% Epithelialization: None Tunneling: No Undermining: No Wound Description Classification: Grade 2 Wound Margin: Flat and Intact Exudate Amount: Small Exudate Type: Serous Exudate Color: amber Foul Odor After Cleansing: No Slough/Fibrino Yes Wound Bed Granulation Amount: Large (67-100%) Exposed Structure Granulation Quality: Red Fascia Exposed: No Necrotic Amount: Small (1-33%) Fat Layer (Subcutaneous Tissue) Exposed: Yes Necrotic Quality: Adherent Slough Tendon Exposed: No Muscle Exposed: No Joint Exposed: No Bone Exposed: No Periwound Skin Texture Kobayashi, Marston E. ([id number]) Texture Color No Abnormalities Noted: No No Abnormalities Noted: No Callus: No Atrophie Blanche: No Crepitus: No Cyanosis: No Excoriation: No Ecchymosis: No Induration: No Erythema: No Rash: No Hemosiderin Staining: No Scarring: No Mottled: No Pallor: No Moisture Rubor: No No Abnormalities Noted: No Dry / Scaly: No Temperature / Pain Maceration: No Temperature: No Abnormality Wound Preparation Ulcer Cleansing: Rinsed/Irrigated with Saline Topical Anesthetic Applied: Other: lidocaine  4%, Treatment Notes Wound #9 (Right Toe Third) 1. Cleansed with: Clean wound with Normal Saline 2. Anesthetic Topical Lidocaine 4% cream to wound bed prior to debridement 3. Peri-wound Care: Moisturizing lotion 4. Dressing Applied: Other dressing (specify in notes) 5. Secondary Dressing Applied ABD Pad 7. Secured with 3 Layer Compression System - Bilateral Notes kerlix wrap Electronic Signature(s) Signed: 06/13/2017 5:02:17 PM By: Montey Hora Entered By: Montey Hora on 06/13/2017 09:39:24 Shaikh, Garrett Johnette Abraham ([id number]) -------------------------------------------------------------------------------- Vitals Details Patient Name: Crom, Jusiah E. Date of Service: 06/13/2017 9:15 AM Medical Record Number: [id number] Patient Account Number: [id number] Date of Birth/Sex: Jul 29, 1950 (67 y.o. Male) Treating RN: Montey Hora Primary Care Maykel Reitter: Lamonte Sakai Other Clinician: Referring Torren Maffeo: Lamonte Sakai Treating Kasin Tonkinson/Extender: Cathie Olden in Treatment: 37 Vital Signs Time Taken: 09:24 Temperature (F): 98.3 Height (in): 69 Pulse (bpm): 76 Weight (lbs): 168 Respiratory Rate (breaths/min): 18 Body Mass Index (BMI): 24.8 Blood Pressure (mmHg): 148/86 Reference Range: 80 - 120 mg / dl Electronic Signature(s) Signed: 06/13/2017 5:02:17 PM By: Montey Hora Entered By: Montey Hora on 06/13/2017 09:27:05

## 2017-06-20 ENCOUNTER — Ambulatory Visit: Payer: Medicare HMO

## 2017-06-20 ENCOUNTER — Inpatient Hospital Stay: Payer: Medicare HMO

## 2017-06-20 ENCOUNTER — Encounter: Payer: Medicare HMO | Admitting: Physician Assistant

## 2017-06-20 ENCOUNTER — Other Ambulatory Visit: Payer: Self-pay | Admitting: Oncology

## 2017-06-20 ENCOUNTER — Other Ambulatory Visit: Payer: Medicare HMO

## 2017-06-20 VITALS — BP 150/80 | HR 71 | Temp 97.1°F | Resp 20

## 2017-06-20 DIAGNOSIS — I130 Hypertensive heart and chronic kidney disease with heart failure and stage 1 through stage 4 chronic kidney disease, or unspecified chronic kidney disease: Secondary | ICD-10-CM | POA: Diagnosis not present

## 2017-06-20 DIAGNOSIS — L97522 Non-pressure chronic ulcer of other part of left foot with fat layer exposed: Secondary | ICD-10-CM | POA: Diagnosis not present

## 2017-06-20 DIAGNOSIS — D509 Iron deficiency anemia, unspecified: Secondary | ICD-10-CM

## 2017-06-20 MED ORDER — SODIUM CHLORIDE 0.9 % IV SOLN
Freq: Once | INTRAVENOUS | Status: AC
  Administered 2017-06-20: 12:00:00 via INTRAVENOUS
  Filled 2017-06-20: qty 1000

## 2017-06-20 MED ORDER — SODIUM CHLORIDE 0.9 % IV SOLN
510.0000 mg | Freq: Once | INTRAVENOUS | Status: AC
  Administered 2017-06-20: 510 mg via INTRAVENOUS
  Filled 2017-06-20: qty 17

## 2017-06-21 NOTE — Progress Notes (Addendum)
KAIN, MILOSEVIC ([id number]) Visit Report for 06/20/2017 Arrival Information Details Patient Name: [name], [name] Date of Service: 06/20/2017 9:30 AM Medical Record Number: [id number] Patient Account Number: [id number] Date of Birth/Sex: 06-09-1950 (67 y.o. Male) Treating RN: Montey Hora Primary Care Londen Bok: Lamonte Sakai Other Clinician: Referring Millicent Blazejewski: Lamonte Sakai Treating Damarcus Reggio/Extender: Melburn Hake, HOYT Weeks in Treatment: 20 Visit Information History Since Last Visit Added or deleted any medications: No Patient Arrived: Ambulatory Any new allergies or adverse reactions: No Arrival Time: 09:32 Had a fall or experienced change in No Accompanied By: brother activities of daily living that may affect Transfer Assistance: None risk of falls: Patient Identification Verified: Yes Signs or symptoms of abuse/neglect since last visito No Secondary Verification Process Completed: Yes Hospitalized since last visit: No Patient Requires Transmission-Based No Has Dressing in Place as Prescribed: Yes Precautions: Has Compression in Place as Prescribed: No Patient Has Alerts: Yes Pain Present Now: No Patient Alerts: DMII Electronic Signature(s) Signed: 06/20/2017 3:33:05 PM By: Montey Hora Entered By: Montey Hora on 06/20/2017 09:36:11 Bucknam, [name] ([id number]) -------------------------------------------------------------------------------- Clinic Level of Care Assessment Details Patient Name: [name], [name] Date of Service: 06/20/2017 9:30 AM Medical Record Number: [id number] Patient Account Number: [id number] Date of Birth/Sex: June 09, 1950 (67 y.o. Male) Treating RN: Ahmed Prima Primary Care Aivah Putman: Lamonte Sakai Other Clinician: Referring Hailyn Zarr: Lamonte Sakai Treating Moorea Boissonneault/Extender: Melburn Hake, HOYT Weeks in Treatment: 58 Clinic Level of Care Assessment Items TOOL 4 Quantity Score X - Use when only an EandM is performed on FOLLOW-UP visit 1 0 ASSESSMENTS  - Nursing Assessment / Reassessment X - Reassessment of Co-morbidities (includes updates in patient status) 1 10 X- 1 5 Reassessment of Adherence to Treatment Plan ASSESSMENTS - Wound and Skin Assessment / Reassessment []  - Simple Wound Assessment / Reassessment - one wound 0 X- 5 5 Complex Wound Assessment / Reassessment - multiple wounds []  - 0 Dermatologic / Skin Assessment (not related to wound area) ASSESSMENTS - Focused Assessment []  - Circumferential Edema Measurements - multi extremities 0 []  - 0 Nutritional Assessment / Counseling / Intervention []  - 0 Lower Extremity Assessment (monofilament, tuning fork, pulses) []  - 0 Peripheral Arterial Disease Assessment (using hand held doppler) ASSESSMENTS - Ostomy and/or Continence Assessment and Care []  - Incontinence Assessment and Management 0 []  - 0 Ostomy Care Assessment and Management (repouching, etc.) PROCESS - Coordination of Care X - Simple Patient / Family Education for ongoing care 1 15 []  - 0 Complex (extensive) Patient / Family Education for ongoing care []  - 0 Staff obtains Programmer, systems, Records, Test Results / Process Orders []  - 0 Staff telephones HHA, Nursing Homes / Clarify orders / etc []  - 0 Routine Transfer to another Facility (non-emergent condition) []  - 0 Routine Hospital Admission (non-emergent condition) []  - 0 New Admissions / Biomedical engineer / Ordering NPWT, Apligraf, etc. []  - 0 Emergency Hospital Admission (emergent condition) X- 1 10 Simple Discharge Coordination Coates, Babatunde E. ([id number]) []  - 0 Complex (extensive) Discharge Coordination PROCESS - Special Needs []  - Pediatric / Minor Patient Management 0 []  - 0 Isolation Patient Management []  - 0 Hearing / Language / Visual special needs []  - 0 Assessment of Community assistance (transportation, D/C planning, etc.) []  - 0 Additional assistance / Altered mentation []  - 0 Support Surface(s) Assessment (bed, cushion, seat,  etc.) INTERVENTIONS - Wound Cleansing / Measurement []  - Simple Wound Cleansing - one wound 0 X- 5 5 Complex Wound Cleansing - multiple wounds X- 1 5 Wound Imaging (photographs -  any number of wounds) []  - 0 Wound Tracing (instead of photographs) []  - 0 Simple Wound Measurement - one wound X- 5 5 Complex Wound Measurement - multiple wounds INTERVENTIONS - Wound Dressings []  - Small Wound Dressing one or multiple wounds 0 X- 2 15 Medium Wound Dressing one or multiple wounds []  - 0 Large Wound Dressing one or multiple wounds X- 1 5 Application of Medications - topical []  - 0 Application of Medications - injection INTERVENTIONS - Miscellaneous []  - External ear exam 0 []  - 0 Specimen Collection (cultures, biopsies, blood, body fluids, etc.) []  - 0 Specimen(s) / Culture(s) sent or taken to Lab for analysis []  - 0 Patient Transfer (multiple staff / Civil Service fast streamer / Similar devices) []  - 0 Simple Staple / Suture removal (25 or less) []  - 0 Complex Staple / Suture removal (26 or more) []  - 0 Hypo / Hyperglycemic Management (close monitor of Blood Glucose) []  - 0 Ankle / Brachial Index (ABI) - do not check if billed separately X- 1 5 Vital Signs Goeken, Izaias E. ([id number]) Has the patient been seen at the hospital within the last three years: Yes Total Score: 160 Level Of Care: New/Established - Level 5 Electronic Signature(s) Signed: 06/20/2017 2:24:57 PM By: Alric Quan Entered By: Alric Quan on 06/20/2017 12:05:11 Spoon, [name] ([id number]) -------------------------------------------------------------------------------- Encounter Discharge Information Details Patient Name: [name], [name] Date of Service: 06/20/2017 9:30 AM Medical Record Number: [id number] Patient Account Number: [id number] Date of Birth/Sex: 03/07/1951 (67 y.o. Male) Treating RN: Ahmed Prima Primary Care Janus Vlcek: Lamonte Sakai Other Clinician: Referring Richardo Popoff: Lamonte Sakai Treating  Deseray Daponte/Extender: Melburn Hake, HOYT Weeks in Treatment: 9 Encounter Discharge Information Items Discharge Pain Level: 0 Discharge Condition: Stable Ambulatory Status: Ambulatory Discharge Destination: Home Transportation: Private Auto Schedule Follow-up Appointment: Yes Medication Reconciliation completed and No provided to Patient/Care Jarrel Knoke: Provided on Clinical Summary of Care: 06/20/2017 Form Type Recipient Paper Patient ED Electronic Signature(s) Signed: 06/20/2017 2:35:19 PM By: Roger Shelter Entered By: Roger Shelter on 06/20/2017 10:30:40 Depaulo, [name] ([id number]) -------------------------------------------------------------------------------- Lower Extremity Assessment Details Patient Name: Rothe, [name] E. Date of Service: 06/20/2017 9:30 AM Medical Record Number: [id number] Patient Account Number: [id number] Date of Birth/Sex: Jan 31, 1951 (67 y.o. Male) Treating RN: Montey Hora Primary Care Jerzy Crotteau: Lamonte Sakai Other Clinician: Referring Forestine Macho: Lamonte Sakai Treating Prudy Candy/Extender: Melburn Hake, HOYT Weeks in Treatment: 38 Edema Assessment Assessed: [Left: No] [Right: No] [Left: Edema] [Right: :] Calf Left: Right: Point of Measurement: 34 cm From Medial Instep 34.8 cm 35.6 cm Ankle Left: Right: Point of Measurement: 12 cm From Medial Instep 26.2 cm 25.5 cm Vascular Assessment Pulses: Dorsalis Pedis Palpable: [Left:Yes] [Right:Yes] Posterior Tibial Extremity colors, hair growth, and conditions: Extremity Color: [Left:Hyperpigmented] [Right:Hyperpigmented] Hair Growth on Extremity: [Left:No] [Right:No] Temperature of Extremity: [Left:Warm] [Right:Warm] Capillary Refill: [Left:< 3 seconds] [Right:< 3 seconds] Electronic Signature(s) Signed: 06/20/2017 3:33:05 PM By: Montey Hora Entered By: Montey Hora on 06/20/2017 09:41:14 Lentz, [name] ([id number]) -------------------------------------------------------------------------------- Multi  Wound Chart Details Patient Name: Westman, Louay E. Date of Service: 06/20/2017 9:30 AM Medical Record Number: [id number] Patient Account Number: [id number] Date of Birth/Sex: 1950/09/20 (67 y.o. Male) Treating RN: Ahmed Prima Primary Care Darshay Deupree: Lamonte Sakai Other Clinician: Referring Jakyren Fluegge: Lamonte Sakai Treating Kenyada Dosch/Extender: Melburn Hake, HOYT Weeks in Treatment: 38 Vital Signs Height(in): 69 Pulse(bpm): 75 Weight(lbs): 168 Blood Pressure(mmHg): 141/85 Body Mass Index(BMI): 25 Temperature(F): 98.1 Respiratory Rate 16 (breaths/min): Photos: [1:No Photos] [2:No Photos] [5:No Photos] Wound Location: [1:Right Toe Great] [2:Right Toe Second] [5:Left Toe Great]  Wounding Event: [1:Gradually Appeared] [2:Gradually Appeared] [5:Gradually Appeared] Primary Etiology: [1:Diabetic Wound/Ulcer of the Lower Extremity] [2:Diabetic Wound/Ulcer of the Lower Extremity] [5:Diabetic Wound/Ulcer of the Lower Extremity] Date Acquired: [1:06/11/2016] [2:06/11/2016] [5:06/11/2016] Weeks of Treatment: [1:38] [2:38] [5:38] Wound Status: [1:Open] [2:Open] [5:Open] Pending Amputation on [1:Yes] [2:Yes] [5:Yes] Presentation: Measurements L x W x D [1:0.6x4x0.1] [2:0.6x1.5x0.1] [5:0.1x0.1x0.1] (cm) Area (cm) : [1:1.885] [2:0.707] [5:0.008] Volume (cm) : [1:0.188] [2:0.071] [5:0.001] % Reduction in Area: [1:96.00%] [2:77.00%] [5:99.90%] % Reduction in Volume: [1:96.00%] [2:76.90%] [5:99.90%] Classification: [1:Grade 2] [2:Grade 2] [5:Grade 2] Periwound Skin Texture: [1:No Abnormalities Noted] [2:No Abnormalities Noted] [5:No Abnormalities Noted] Periwound Skin Moisture: [1:No Abnormalities Noted] [2:No Abnormalities Noted] [5:No Abnormalities Noted] Periwound Skin Color: [1:No Abnormalities Noted No] [2:No Abnormalities Noted No] [5:No Abnormalities Noted No] Wound Number: 6 9 N/A Photos: No Photos No Photos N/A Wound Location: Left Toe Second Right Toe Third N/A Wounding Event: Gradually  Appeared Gradually Appeared N/A Primary Etiology: Diabetic Wound/Ulcer of the Diabetic Wound/Ulcer of the N/A Lower Extremity Lower Extremity Date Acquired: 06/11/2016 12/31/2016 N/A Weeks of Treatment: 38 24 N/A Wound Status: Open Open N/A Pending Amputation on Yes No N/A Presentation: Measurements L x W x D 0.8x0.9x0.1 0.1x0.1x0.1 N/A (cm) Area (cm) : 0.565 0.008 N/A Volume (cm) : 0.057 0.001 N/A Keetch, Brendon E. ([id number]) % Reduction in Area: 73.50% 98.00% N/A % Reduction in Volume: 73.40% 97.40% N/A Classification: Grade 2 Grade 2 N/A Periwound Skin Texture: No Abnormalities Noted No Abnormalities Noted N/A Periwound Skin Moisture: No Abnormalities Noted No Abnormalities Noted N/A Periwound Skin Color: No Abnormalities Noted No Abnormalities Noted N/A Tenderness on Palpation: No No N/A Treatment Notes Electronic Signature(s) Signed: 06/20/2017 2:24:57 PM By: Alric Quan Entered By: Alric Quan on 06/20/2017 10:05:55 Laury, [name] ([id number]) -------------------------------------------------------------------------------- [place] Details Patient Name: Mellinger, Denver E. Date of Service: 06/20/2017 9:30 AM Medical Record Number: [id number] Patient Account Number: [id number] Date of Birth/Sex: [birth year] (67 y.o. Male) Treating RN: Ahmed Prima Primary Care Aerielle Stoklosa: Lamonte Sakai Other Clinician: Referring Anh Bigos: Lamonte Sakai Treating Destini Cambre/Extender: Melburn Hake, HOYT Weeks in Treatment: 53 Active Inactive ` Abuse / Safety / Falls / Self Care Management Nursing Diagnoses: Potential for falls Goals: Patient will remain injury free related to falls Date Initiated: 09/21/2016 Target Resolution Date: 09/14/2017 Goal Status: Active Interventions: Assess fall risk on admission and as needed Notes: ` Nutrition Nursing Diagnoses: Potential for alteratiion in Nutrition/Potential for imbalanced nutrition Goals: Patient/caregiver agrees to  and verbalizes understanding of need to use nutritional supplements and/or vitamins as prescribed Date Initiated: 09/21/2016 Target Resolution Date: 09/14/2017 Goal Status: Active Interventions: Assess patient nutrition upon admission and as needed per policy Notes: ` Orientation to the Wound Care Program Nursing Diagnoses: Knowledge deficit related to the wound healing center program Goals: Patient/caregiver will verbalize understanding of the [place] Date Initiated: 09/21/2016 Target Resolution Date: 06/15/2017 Goal Status: Active Interventions: DARKBazil, Dhanani ([id number]) Provide education on orientation to the wound center Notes: ` Wound/Skin Impairment Nursing Diagnoses: Knowledge deficit related to smoking impact on wound healing Goals: Ulcer/skin breakdown will have a volume reduction of 30% by week 4 Date Initiated: 09/21/2016 Target Resolution Date: 08/17/2017 Goal Status: Active Ulcer/skin breakdown will have a volume reduction of 50% by week 8 Date Initiated: 09/21/2016 Target Resolution Date: 08/17/2017 Goal Status: Active Ulcer/skin breakdown will have a volume reduction of 80% by week 12 Date Initiated: 09/21/2016 Target Resolution Date: 09/14/2017 Goal Status: Active Ulcer/skin breakdown will heal within 14 weeks Date Initiated: 09/21/2016  Target Resolution Date: 08/17/2017 Goal Status: Active Interventions: Assess patient/caregiver ability to obtain necessary supplies Assess patient/caregiver ability to perform ulcer/skin care regimen upon admission and as needed Assess ulceration(s) every visit Notes: Electronic Signature(s) Signed: 06/20/2017 2:24:57 PM By: Alric Quan Entered By: Alric Quan on 06/20/2017 10:05:44 Ruberg, [name] ([id number]) -------------------------------------------------------------------------------- Pain Assessment Details Patient Name: Hird, Taevon E. Date of Service: 06/20/2017 9:30 AM Medical Record  Number: [id number] Patient Account Number: [id number] Date of Birth/Sex: 1950-09-24 (67 y.o. Male) Treating RN: Montey Hora Primary Care Ruthie Berch: Lamonte Sakai Other Clinician: Referring Logan Baltimore: Lamonte Sakai Treating Danyal Whitenack/Extender: Melburn Hake, HOYT Weeks in Treatment: 49 Active Problems Location of Pain Severity and Description of Pain Patient Has Paino Yes Site Locations Pain Location: Generalized Pain With Dressing Change: No Duration of the Pain. Constant / Intermittento Intermittent Pain Management and Medication Current Pain Management: Notes Topical or injectable lidocaine is offered to patient for acute pain when surgical debridement is performed. If needed, Patient is instructed to use over the counter pain medication for the following 24-48 hours after debridement. Wound care MDs do not prescribed pain medications. Patient has chronic pain or uncontrolled pain. Patient has been instructed to make an appointment with their Primary Care Physician for pain management. Electronic Signature(s) Signed: 06/20/2017 3:33:05 PM By: Montey Hora Entered By: Montey Hora on 06/20/2017 09:37:09 Buzzell, [name] ([id number]) -------------------------------------------------------------------------------- Patient/Caregiver Education Details Patient Name: Bruyere, Nello E. Date of Service: 06/20/2017 9:30 AM Medical Record Number: [id number] Patient Account Number: [id number] Date of Birth/Gender: 17-Dec-1950 (67 y.o. Male) Treating RN: Roger Shelter Primary Care Physician: Lamonte Sakai Other Clinician: Referring Physician: Lamonte Sakai Treating Physician/Extender: Sharalyn Ink in Treatment: 42 Education Assessment Education Provided To: Patient Education Topics Provided Wound/Skin Impairment: Handouts: Caring for Your Ulcer Methods: Explain/Verbal Responses: State content correctly Electronic Signature(s) Signed: 06/20/2017 2:35:19 PM By: Roger Shelter Entered  By: Roger Shelter on 06/20/2017 10:38:58 Lohse, [name] ([id number]) -------------------------------------------------------------------------------- Wound Assessment Details Patient Name: Thackeray, Jp E. Date of Service: 06/20/2017 9:30 AM Medical Record Number: [id number] Patient Account Number: [id number] Date of Birth/Sex: 1950-06-15 (67 y.o. Male) Treating RN: Montey Hora Primary Care Sravya Grissom: Lamonte Sakai Other Clinician: Referring Justiss Gerbino: Lamonte Sakai Treating Gabrial Poppell/Extender: Melburn Hake, HOYT Weeks in Treatment: 38 Wound Status Wound Number: 1 Primary Diabetic Wound/Ulcer of the Lower Extremity Etiology: Wound Location: Right Toe Great Wound Open Wounding Event: Gradually Appeared Status: Date Acquired: 06/11/2016 Comorbid Anemia, Lymphedema, Congestive Heart Weeks Of Treatment: 38 History: Failure, Hypertension, Peripheral Venous Clustered Wound: No Disease, Type II Diabetes, Neuropathy Pending Amputation On Presentation Photos Wound Measurements Length: (cm) 0.6 Width: (cm) 4 Depth: (cm) 0.1 Area: (cm) 1.885 Volume: (cm) 0.188 % Reduction in Area: 96% % Reduction in Volume: 96% Epithelialization: Small (1-33%) Tunneling: No Undermining: No Wound Description Classification: Grade 2 Foul Odor Aft Wound Margin: Flat and Intact Slough/Fibrin Exudate Amount: Large Exudate Type: Serosanguineous Exudate Color: red, brown er Cleansing: No o No Wound Bed Granulation Amount: Large (67-100%) Exposed Structure Granulation Quality: Red Fascia Exposed: No Necrotic Amount: Small (1-33%) Fat Layer (Subcutaneous Tissue) Exposed: Yes Necrotic Quality: Eschar, Adherent Slough Tendon Exposed: No Muscle Exposed: No Joint Exposed: No Bone Exposed: No Periwound Skin Texture Pugmire, Tavio E. ([id number]) Texture Color No Abnormalities Noted: No No Abnormalities Noted: No Callus: Yes Atrophie Blanche: No Crepitus: No Cyanosis: No Excoriation:  Yes Ecchymosis: No Induration: No Erythema: No Rash: No Hemosiderin Staining: No Scarring: No Mottled: No Pallor: No Moisture Rubor: No No Abnormalities Noted: No Dry / Scaly:  No Temperature / Pain Maceration: Yes Temperature: No Abnormality Wound Preparation Ulcer Cleansing: Rinsed/Irrigated with Saline Topical Anesthetic Applied: Other: lidocaine 4%, Treatment Notes Wound #1 (Right Toe Great) 1. Cleansed with: Clean wound with Normal Saline 2. Anesthetic Topical Lidocaine 4% cream to wound bed prior to debridement 4. Dressing Applied: Other dressing (specify in notes) 5. Secondary Dressing Applied ABD Pad Dry Gauze 6. Footwear/Offloading device applied Compression stockings Notes conform and tape Electronic Signature(s) Signed: 06/20/2017 3:33:05 PM By: Montey Hora Entered By: Montey Hora on 06/20/2017 11:34:54 Boursiquot, [name] ([id number]) -------------------------------------------------------------------------------- Wound Assessment Details Patient Name: Ketchum, Lamarion E. Date of Service: 06/20/2017 9:30 AM Medical Record Number: [id number] Patient Account Number: [id number] Date of Birth/Sex: April 04, 1951 (67 y.o. Male) Treating RN: Montey Hora Primary Care Yun Gutierrez: Lamonte Sakai Other Clinician: Referring Izick Gasbarro: Lamonte Sakai Treating Garlon Tuggle/Extender: Melburn Hake, HOYT Weeks in Treatment: 38 Wound Status Wound Number: 2 Primary Diabetic Wound/Ulcer of the Lower Extremity Etiology: Wound Location: Right Toe Second Wound Open Wounding Event: Gradually Appeared Status: Date Acquired: 06/11/2016 Comorbid Anemia, Lymphedema, Congestive Heart Weeks Of Treatment: 38 History: Failure, Hypertension, Peripheral Venous Clustered Wound: No Disease, Type II Diabetes, Neuropathy Pending Amputation On Presentation Photos Wound Measurements Length: (cm) 0.6 % Reduction in Width: (cm) 1.5 % Reduction in Depth: (cm) 0.1 Epithelializat Area: (cm)  0.707 Tunneling: Volume: (cm) 0.071 Undermining: Area: 77% Volume: 76.9% ion: Medium (34-66%) No No Wound Description Classification: Grade 2 Foul Odor Afte Wound Margin: Flat and Intact Slough/Fibrino Exudate Amount: Large Exudate Type: Serosanguineous Exudate Color: red, brown r Cleansing: No No Wound Bed Granulation Amount: Large (67-100%) Exposed Structure Granulation Quality: Red Fascia Exposed: No Necrotic Amount: Small (1-33%) Fat Layer (Subcutaneous Tissue) Exposed: Yes Necrotic Quality: Adherent Slough Tendon Exposed: No Muscle Exposed: No Joint Exposed: No Bone Exposed: No Periwound Skin Texture Neuenfeldt, Ermon E. ([id number]) Texture Color No Abnormalities Noted: No No Abnormalities Noted: No Callus: No Atrophie Blanche: No Crepitus: No Cyanosis: No Excoriation: No Ecchymosis: No Induration: No Erythema: No Rash: No Hemosiderin Staining: No Scarring: Yes Mottled: No Pallor: No Moisture Rubor: No No Abnormalities Noted: No Dry / Scaly: No Temperature / Pain Maceration: Yes Temperature: No Abnormality Wound Preparation Ulcer Cleansing: Rinsed/Irrigated with Saline Topical Anesthetic Applied: Other: lidocaine 4%, Treatment Notes Wound #2 (Right Toe Second) 1. Cleansed with: Clean wound with Normal Saline 2. Anesthetic Topical Lidocaine 4% cream to wound bed prior to debridement 4. Dressing Applied: Other dressing (specify in notes) 5. Secondary Dressing Applied ABD Pad Dry Gauze 6. Footwear/Offloading device applied Compression stockings Notes conform and tape Electronic Signature(s) Signed: 06/20/2017 3:33:05 PM By: Montey Hora Entered By: Montey Hora on 06/20/2017 11:35:11 Stowell, [name] ([id number]) -------------------------------------------------------------------------------- Wound Assessment Details Patient Name: Rhyner, Raad E. Date of Service: 06/20/2017 9:30 AM Medical Record Number: [id number] Patient Account Number:  [id number] Date of Birth/Sex: [birth year] (67 y.o. Male) Treating RN: Montey Hora Primary Care Athea Haley: Lamonte Sakai Other Clinician: Referring Becker Christopher: Lamonte Sakai Treating Chanise Habeck/Extender: Melburn Hake, HOYT Weeks in Treatment: 38 Wound Status Wound Number: 5 Primary Diabetic Wound/Ulcer of the Lower Extremity Etiology: Wound Location: Left Toe Great Wound Open Wounding Event: Gradually Appeared Status: Date Acquired: 06/11/2016 Comorbid Anemia, Lymphedema, Congestive Heart Weeks Of Treatment: 38 History: Failure, Hypertension, Peripheral Venous Clustered Wound: No Disease, Type II Diabetes, Neuropathy Pending Amputation On Presentation Photos Wound Measurements Length: (cm) 0.1 % Reduction in Width: (cm) 0.1 % Reduction in Depth: (cm) 0.1 Epithelializat Area: (cm) 0.008 Tunneling: Volume: (cm) 0.001 Undermining: Area: 99.9% Volume: 99.9% ion: Small (  1-33%) No No Wound Description Classification: Grade 2 Foul Odor Afte Wound Margin: Flat and Intact Due to Product Exudate Amount: Large Slough/Fibrino Exudate Type: Serosanguineous Exudate Color: red, brown r Cleansing: Yes Use: No No Wound Bed Granulation Amount: Medium (34-66%) Exposed Structure Granulation Quality: Red Fascia Exposed: No Necrotic Amount: Small (1-33%) Fat Layer (Subcutaneous Tissue) Exposed: Yes Necrotic Quality: Adherent Slough Tendon Exposed: No Muscle Exposed: No Joint Exposed: No Bone Exposed: No Periwound Skin Texture Klassen, Amarian E. ([id number]) Texture Color No Abnormalities Noted: No No Abnormalities Noted: No Callus: No Atrophie Blanche: No Crepitus: No Cyanosis: No Excoriation: No Ecchymosis: No Induration: No Erythema: No Rash: No Hemosiderin Staining: No Scarring: No Mottled: No Pallor: No Moisture Rubor: No No Abnormalities Noted: No Dry / Scaly: No Temperature / Pain Maceration: Yes Temperature: No Abnormality Wound Preparation Ulcer Cleansing:  Rinsed/Irrigated with Saline Topical Anesthetic Applied: Other: lidocaine 4%, Treatment Notes Wound #5 (Left Toe Great) 1. Cleansed with: Clean wound with Normal Saline 2. Anesthetic Topical Lidocaine 4% cream to wound bed prior to debridement 4. Dressing Applied: Other dressing (specify in notes) 5. Secondary Dressing Applied ABD Pad Dry Gauze 6. Footwear/Offloading device applied Compression stockings Notes conform and tape Electronic Signature(s) Signed: 06/20/2017 3:33:05 PM By: Montey Hora Entered By: Montey Hora on 06/20/2017 11:35:46 Shadduck, [name] ([id number]) -------------------------------------------------------------------------------- Wound Assessment Details Patient Name: Renfrew, Neale E. Date of Service: 06/20/2017 9:30 AM Medical Record Number: [id number] Patient Account Number: [id number] Date of Birth/Sex: Nov 22, 1950 (67 y.o. Male) Treating RN: Montey Hora Primary Care Edlin Ford: Lamonte Sakai Other Clinician: Referring Ninah Moccio: Lamonte Sakai Treating Laura Radilla/Extender: Melburn Hake, HOYT Weeks in Treatment: 38 Wound Status Wound Number: 6 Primary Diabetic Wound/Ulcer of the Lower Extremity Etiology: Wound Location: Left Toe Second Wound Open Wounding Event: Gradually Appeared Status: Date Acquired: 06/11/2016 Comorbid Anemia, Lymphedema, Congestive Heart Weeks Of Treatment: 38 History: Failure, Hypertension, Peripheral Venous Clustered Wound: No Disease, Type II Diabetes, Neuropathy Pending Amputation On Presentation Photos Wound Measurements Length: (cm) 0.8 % Reduction in Width: (cm) 0.9 % Reduction in Depth: (cm) 0.1 Epithelializat Area: (cm) 0.565 Tunneling: Volume: (cm) 0.057 Undermining: Area: 73.5% Volume: 73.4% ion: Small (1-33%) No No Wound Description Classification: Grade 2 Foul Odor Afte Wound Margin: Flat and Intact Slough/Fibrino Exudate Amount: Large Exudate Type: Serosanguineous Exudate Color: red, brown r  Cleansing: No No Wound Bed Granulation Amount: Large (67-100%) Exposed Structure Granulation Quality: Red Fascia Exposed: No Necrotic Amount: Small (1-33%) Fat Layer (Subcutaneous Tissue) Exposed: Yes Necrotic Quality: Adherent Slough Tendon Exposed: No Muscle Exposed: No Joint Exposed: No Bone Exposed: No Periwound Skin Texture Vandenheuvel, Talon E. ([id number]) Texture Color No Abnormalities Noted: No No Abnormalities Noted: No Callus: No Atrophie Blanche: No Crepitus: No Cyanosis: No Excoriation: No Ecchymosis: No Induration: No Erythema: No Rash: No Hemosiderin Staining: No Scarring: No Mottled: No Pallor: No Moisture Rubor: No No Abnormalities Noted: No Dry / Scaly: No Temperature / Pain Maceration: Yes Temperature: No Abnormality Wound Preparation Ulcer Cleansing: Rinsed/Irrigated with Saline Topical Anesthetic Applied: Other: lidocaine 4%, Treatment Notes Wound #6 (Left Toe Second) 1. Cleansed with: Clean wound with Normal Saline 2. Anesthetic Topical Lidocaine 4% cream to wound bed prior to debridement 4. Dressing Applied: Other dressing (specify in notes) 5. Secondary Dressing Applied ABD Pad Dry Gauze 6. Footwear/Offloading device applied Compression stockings Notes conform and tape Electronic Signature(s) Signed: 06/20/2017 3:33:05 PM By: Montey Hora Entered By: Montey Hora on 06/20/2017 11:36:06 Meddings, [name] ([id number]) -------------------------------------------------------------------------------- Wound Assessment Details Patient Name: Crupi, Eulice E. Date  of Service: 06/20/2017 9:30 AM Medical Record Number: [id number] Patient Account Number: [id number] Date of Birth/Sex: 1950/06/26 (67 y.o. Male) Treating RN: Montey Hora Primary Care Zakiya Sporrer: Lamonte Sakai Other Clinician: Referring Leia Coletti: Lamonte Sakai Treating Andri [name]/Extender: Melburn Hake, HOYT Weeks in Treatment: 38 Wound Status Wound Number: 9 Primary Diabetic Wound/Ulcer  of the Lower Extremity Etiology: Wound Location: Right Toe Third Wound Open Wounding Event: Gradually Appeared Status: Date Acquired: 12/31/2016 Comorbid Anemia, Lymphedema, Congestive Heart Weeks Of Treatment: 24 History: Failure, Hypertension, Peripheral Venous Clustered Wound: No Disease, Type II Diabetes, Neuropathy Photos Wound Measurements Length: (cm) 0.1 % Reduction in Width: (cm) 0.1 % Reduction in Depth: (cm) 0.1 Epithelializat Area: (cm) 0.008 Tunneling: Volume: (cm) 0.001 Undermining: Area: 98% Volume: 97.4% ion: None No No Wound Description Classification: Grade 2 Foul Odor Afte Wound Margin: Flat and Intact Slough/Fibrino Exudate Amount: Small Exudate Type: Serous Exudate Color: amber r Cleansing: No Yes Wound Bed Granulation Amount: Large (67-100%) Exposed Structure Granulation Quality: Red Fascia Exposed: No Necrotic Amount: Small (1-33%) Fat Layer (Subcutaneous Tissue) Exposed: Yes Necrotic Quality: Adherent Slough Tendon Exposed: No Muscle Exposed: No Joint Exposed: No Bone Exposed: No Periwound Skin Texture Texture Color Winch, Decoda E. ([id number]) No Abnormalities Noted: No No Abnormalities Noted: No Callus: No Atrophie Blanche: No Crepitus: No Cyanosis: No Excoriation: No Ecchymosis: No Induration: No Erythema: No Rash: No Hemosiderin Staining: No Scarring: No Mottled: No Pallor: No Moisture Rubor: No No Abnormalities Noted: No Dry / Scaly: No Temperature / Pain Maceration: No Temperature: No Abnormality Wound Preparation Ulcer Cleansing: Rinsed/Irrigated with Saline Topical Anesthetic Applied: Other: lidocaine 4%, Treatment Notes Wound #9 (Right Toe Third) 1. Cleansed with: Clean wound with Normal Saline 2. Anesthetic Topical Lidocaine 4% cream to wound bed prior to debridement 4. Dressing Applied: Other dressing (specify in notes) 5. Secondary Dressing Applied ABD Pad Dry Gauze 6. Footwear/Offloading device  applied Compression stockings Notes conform and tape Electronic Signature(s) Signed: 06/20/2017 3:33:05 PM By: Montey Hora Entered By: Montey Hora on 06/20/2017 11:36:22 Rylander, [name] ([id number]) -------------------------------------------------------------------------------- Vitals Details Patient Name: Macqueen, Kareen E. Date of Service: 06/20/2017 9:30 AM Medical Record Number: [id number] Patient Account Number: [id number] Date of Birth/Sex: 12-Apr-1950 (67 y.o. Male) Treating RN: Montey Hora Primary Care Archie Shea: Lamonte Sakai Other Clinician: Referring Jaymar Loeber: Lamonte Sakai Treating Daiden Coltrane/Extender: Melburn Hake, HOYT Weeks in Treatment: 48 Vital Signs Time Taken: 09:37 Temperature (F): 98.1 Height (in): 69 Pulse (bpm): 75 Weight (lbs): 168 Respiratory Rate (breaths/min): 16 Body Mass Index (BMI): 24.8 Blood Pressure (mmHg): 141/85 Reference Range: 80 - 120 mg / dl Electronic Signature(s) Signed: 06/20/2017 3:33:05 PM By: Montey Hora Entered By: Montey Hora on 06/20/2017 09:37:29

## 2017-06-23 NOTE — Progress Notes (Signed)
[name] ([id number]) Visit Report for 06/20/2017 Chief Complaint Document Details Patient Name: [name], [name] Date of Service: 06/20/2017 9:30 AM Medical Record Number: [id number] Patient Account Number: [id number] Date of Birth/Sex: 07/15/1950 (67 y.o. Male) Treating RN: [name] Primary Care Provider: Lamonte [name] Other Clinician: Referring Provider: Lamonte [name] Treating Provider/Extender: [name], [name] in Treatment: 30 Information Obtained from: Patient Chief Complaint Patient presents for treatment of an open diabetic ulcer to both feet Electronic Signature(s) Signed: 06/21/2017 6:13:52 PM By: [name] Keeler PA-C Entered By: [name] on 06/20/2017 15:57:17 Fleet, [name] ([id number]) -------------------------------------------------------------------------------- HPI Details Patient Name: [name], [name] Date of Service: 06/20/2017 9:30 AM Medical Record Number: [id number] Patient Account Number: [id number] Date of Birth/Sex: 08-10-1950 (67 y.o. Male) Treating RN: [name] Primary Care Provider: Lamonte [name] Other Clinician: Referring Provider: Lamonte [name] Treating Provider/Extender: [name], [name] [name] in Treatment: 32 History of Present Illness Location: bilateral feet ulceration on the toes Quality: Patient reports experiencing a dull pain to affected area(s). Severity: Patient states wound are getting better Duration: Patient has had the wound for > 3 months prior to seeking treatment at the wound center Timing: Pain in wound is constant (hurts all the time) Context: The wound would happen gradually Modifying Factors: Other treatment(s) tried include:treatment for lymphedema and is seen by the podiatrist [name] Associated Signs and Symptoms: Patient reports having increase swelling. HPI Description: 67 year old patient here to see as for bilateral feet ulceration to on his left first and second toe and 2 on his right first and  second toe, which she's had for about 4 months. 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 The patient is also undergoing workup by medical oncology for a intra-abdominal lymphadenopathy. 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 He is also status post appendectomy, inguinal lymph node biopsy, rectal biopsy and rectal examination under anesthesia. he currently smokes cigarettes about half packet a day. In March of this year he was seen by [name], for evaluation of bilateral varicose veins and besides wearing compression stockings he had recommended laser ablation of the right and left great saphenous veins to eleviate the symptoms and complications of severe superficial venous reflux disease. He also recommended lymphedema pumps for better control of his lymphedema. The patient recently has had on 08/23/2016, right greater saphenous vein ablation with the laser energy Earlier lower extremity venous reflux examination done on 05/08/2016 showed no DVT or SVT both lower legs but incompetence of bilateral great saphenous veins was present. A lower arterial study was also done and there was no significant right lower and left lower extremity problems based on a normal toe brachial index bilaterally and the ABI was 1.21 the left and 1.23 on the right. His post ablation venous duplex examination showed successful ablation of the right GS vein with thrombus formation 2 below the right saphenofemoral junction. The deep system was patent without evidence of thrombosis and this was done on 08/30/2016. the patient also has a squamous cell cancer of the skin of the buttock and is recently undergone radiation therapy for this prior to  excisional surgery. Addendum: regarding his x-rays done today and x-ray of the left foot -- IMPRESSION: No objective evidence of osteomyelitis. There  are soft tissue changes which may reflect cellulitis. X-ray of the right foot -- IMPRESSION:Findings compatible with cellulitis of the toes. No objective evidence of osteomyelitis is observed. 10/01/16 on evaluation today patient's wounds appeared to be doing some better. I did review the x-rays as well which showed no evidence of osteomyelitis although there was evidence on x-ray of cellulitis. He fortunately is not having any discomfort although he continues to have some swelling. He does not remember being on any antibiotics recently. 10/15/16 on evaluation today patient's wounds overall appear to be doing better although he does have a new location noted on the left foot. Fortunately he is not having significant pain. It almost has the appearance that something is rubbing on the end of his toes but he wears the open toe shoes and according to what he is telling me never wears anything that would rub on his foot. There is no evidence of infection and specifically no evidence of a fungal infection 10/22/16 On evaluation today patient's wounds appeared to be doing better compared to last week in regard to his bilateral Nester, Flemon E. ([id number]) lower extremities. Fortunately I happy with how things are progressing although he still has ulcers I feel like that he is improving and appropriate manner. 11/12/16 on evaluation today patient appears to be doing well in regard to his bilateral feet and the respective wounds. We have been using surrounding her dressings along with an antifungal cream which seems to be doing very well. He has no bilateral dysfunction noticed that the rituals are weight loss at this point. He also has no nausea or vomiting a note purulent discharge. He did see Vein and vascular today and he tells me that they told him he could  have surgery for his venous stasis but they did not feel like it was [name] it in his words. Fortunately patient's wounds do appear to be getting sneakily better. 11/26/2016 -- he says he is going to have some surgery during this week at [facility] for possibly a colon resection. 12/31/2016 -- the patient has been noncompliant with his smoking and I'm not sure whether he is also started drinking again. He continues to be very nonchalant about his care 01/14/2017 -- the patient's HandP has been reviewed well and I understand he is being compliant with trying to give up smoking and his local dressing changes. He does not have any surgical options of 4 to him by his vascular surgeons.he was last seen in early August by [name] who recommended compression stockings,and possibly lymph pumps in 2-3 months after doing a review ultrasound. 01/28/2017 - the patient did not have any fresh complaints but on examination I noted a large lacerated wound on the plantar aspect of his right fourth toe which had a lot of necrotic debris and it probes down to bone. 02/07/2017 -- x-ray of the right foot -- IMPRESSION: Soft tissue swelling about the first through fourth toes consistent with cellulitis. New destructive change in the tuft of the distal phalanx of the great toe is consistent with osteomyelitis. 02/14/2017 -- the patient's MRI is pending this coming Monday and he still continues to smoke. We have again gone over off loading of his wounds in great detail and he says he's been compliant. 02/21/2017 -- MR of the right foot -- IMPRESSION: 1. Soft tissue ulcer at the tip of the first, second and third toe knows. Cortical irregularity and bone marrow edema in the first distal phalanx most concerning  for osteomyelitis. Mild marrow edema in the second and third distal phalanx without definite cortical destruction which may reflect early osteomyelitis versus reactive marrow edema. 2. Soft tissue  edema surrounding the first phalanx most consistent with cellulitis. the patient was also recently evaluated by his medical oncologist Dr. 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 She is treating him with weekly Procrit. She is also keeping intra-abdominal lymphadenopathy and right lower lobe lung nodule under observation. 04/04/2017 -- he was seen by Dr. Adrian Prows on 03/25/2017 -- after review he empirically put him on ciprofloxacin and doxycycline as they have good bone penetration and good bioavailability and it will cover the usual pathogens and diabetic foot osteomyelitis. He will check inflammatory markers and plan a 80-43 week old records. C-reactive protein was 0.3 and the ESR was 72 04/18/17 on evaluation today patient appears to be doing about the same in regard to his lower extremity wounds bilaterally. He has continued to use the antifungal cream which does seem to be beneficial. Nonetheless the ulcers do seem to in some areas be epithelial eyes over and in other areas are still open. He is having no significant discomfort. 04/25/17-he is here in follow-up evaluation for multiple ulcerations to multiple toes bilaterally. He states he did see Dr. Ola Spurr again last week and continues antibiotic therapy. He is voicing no complaints or concerns, will continue with current treatment plan will possibility of adding compression therapy next week after an additional week of treatment/lotions to BLE prescribed by Dr Ola Spurr 05/02/17 he is here in follow up for for multiple ulcers to multiple toes bilaterally. we will stop using antifungal cream and will continue with silvercel and follow up next week 05/09/17-he is here in follow-up for multiple ulcerations to multiple toes bilaterally. There is improvement in appearance. He has not completely stopped using antifungal cream, but admits he has not using it between  the toes. He has an appointment with Dr. Ola Spurr on 2/11, continues on doxycycline and Cipro. It has been 5 [name] of antibiotic therapy, we will order plain film xray to evaluate for osteomyelitis next week, prior to follow up with ID. Will continue with silvercel and follow up next week 05/16/17-he is here in follow-up evaluation for multiple ulcerations to multiple toes bilaterally and new wound to the right posterior heel. There is essentially no change in appearance, deteriorating measurements; he has a history of waxing and waning measurements. He admits that he continues to apply moisturizer/cream/ointment to his toes despite weekly reminders to only apply silvercel to his toes. He states that he thinks the surgical shoe contributed to the superficial ulcer to his posterior Botts, [name] ([id number]) heel, he is unable to articulate if this was an area of dry cracked skin as he has a similar area to the left heel. He now is wearing open toed slippers. He has an appointment with Dr. Ola Spurr on 2/11. We have ordered x-rays for her bilateral feet; he was advised to obtain the x-rays today or tomorrow. He will follow-up next week 05/23/17-he is here in follow-up evaluation for multiple ulcerations to multiple toes bilaterally and the right posterior heel. There is improvement in maceration. He has been compliant and not applying any moisturizing agent to his toes. He has been using Lac-Hydrin for his lower extremities with improvement. He did not go to his appointment on Monday with Dr. Ola Spurr secondary to financial concerns. X-rays for her bilateral feet  showed: LEFT FOOT with slight erosion of the tuft of the distal phalanges of the left first and second toe suspicious for osteomyelitis, RIGHT FOOT with 1.erosion of the tufts of the distal phalanges of the right first second and possibly third toes consistent with osteomyelitis, 2 no definitive abnormality of the calcaneus is seen on the  images obtained, 3. Plantar calcaneal degenerative spur. We briefly discussed hyperbaric adjunctive therapy for treatment of chronic refractory osteomyelitis. I do not find an a1c in EMR, will contact PCP for record, or order if needed. He has been encouraged to contact Dr Ola Spurr office regarding the follow-up appointment, encouraged him to inquire about payment plan. We will continue with same treatment plan and follow-up next week. He states he is still taking antibiotics and has "a lot" left. He states he has been taking them as directed, 2 pills twice daily. According to Dr. Blane Ohara office notes he was originally started on 12/17 for 4 [name] and extended on 1/14 for an additional 4 [name]. He should be done with his antibiotic therapy, he was advised to bring his bottles and to his next appointment, we will contact pharmacy. 05/30/17-he is here in follow-up evaluation for multiple ulcerations to multiple toes bilaterally and the right posterior heel. He is accompanied by his brother-in-law. Wounds are stable. He has yet to make up with Dr. Ola Spurr. We contacted his PCP, with no record of recent A1c we will draw an A1c. His brother-in-law states that he was taken off all of his diabetic medication secondary to kidney function. 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 The antibiotics were counted and have approximately 2 [name] left, although they should be complete. The pill organizer reveals missing days. We discussed the need for consistent medications, to have optimal benefit of medication. He has a cousin that lives with him and he will ask her to check his organizer daily. He has been advised to follow up with Dr Ola Spurr, and will go by the office today. He has been advised to quit smoking. 06/06/17-he is here in follow up evaluation. He has had to make an appointment with Dr.  Ola Spurr. He did have blood work obtained, a1c 5. He continues to take antibiotic therapy. Significant improvement in bilateral lower extremity edema with compression therapy. Essentially no change in ulcerations to toes. He states he is "going to try something different" and "let me know next week" if it works; he would not provide any additional information and was encouraged to follow our orders. We will follow up next week 06/13/17-he is here in follow-up evaluation. He has an appointment with Dr. Ola Spurr tomorrow morning. He states he purchased an ointment from [facility] and applied to his toes for 3 days, he does not remember the name of the ointment. There is improvement to his wounds, minimal maceration. He continues to take antibiotic therapy, this should have been completed last month. His brother-in-law who regularly accompanies his appointments was asked to take the bottles to the appointment tomorrow with Dr. Ola Spurr so he is aware. We will continue with 3 layer compression, and order OPEN TOE compression 20-30mmHg; we will apply compression stockings next week. He continues to smoke, smoked "2 cigarettes" last week 06/20/17 on evaluation today patient did receive his compression stockings which he has with him today for both lower extremities. With that being said he tells me at this point in time that he is very  happy to have these he really is not a big fan of the compression wraps that we have been utilizing although they have been of great benefit for him. Nonetheless at this point he does want to switch to the compression stockings. In my opinion as long as he is continuing with compression I'm okay with the stockings or the wraps. Electronic Signature(s) Signed: 06/21/2017 6:13:52 PM By: [name] Keeler PA-C Entered By: [name] on 06/20/2017 15:57:30 Scaff, [name]  ([id number]) -------------------------------------------------------------------------------- Physical Exam Details Patient Name: [name], [name] Date of Service: 06/20/2017 9:30 AM Medical Record Number: [id number] Patient Account Number: [id number] Date of Birth/Sex: [birth year] (68 y.o. Male) Treating RN: [name] Primary Care Provider: Lamonte [name] Other Clinician: Referring Provider: Lamonte [name] Treating Provider/Extender: [name], Graceanna Theissen [name] in Treatment: 31 Constitutional Obese and well-hydrated in no acute distress. Respiratory normal breathing without difficulty. clear to auscultation bilaterally. Cardiovascular regular rate and rhythm with normal S1, S2. 1+ pitting edema of the bilateral lower extremities. Psychiatric this patient is able to make decisions and demonstrates good insight into disease process. Alert and Oriented x 3. pleasant and cooperative. Notes Patient's wounds do not require any sharp debridement today. Overall he appears to be stable and doing fairly well which is good news. Electronic Signature(s) Signed: 06/21/2017 6:13:52 PM By: [name] Keeler PA-C Entered By: [name] on 06/20/2017 15:58:21 Chaviano, [name] ([id number]) -------------------------------------------------------------------------------- Physician Orders Details Patient Name: [name], [name] Date of Service: 06/20/2017 9:30 AM Medical Record Number: [id number] Patient Account Number: [id number] Date of Birth/Sex: [birth year] (67 y.o. Male) Treating RN: [name] Primary Care Provider: Lamonte [name] Other Clinician: Referring Provider: Lamonte [name] Treating Provider/Extender: [name], Bently Morath [name] in Treatment: 2 Verbal / Phone Orders: Yes Clinician: Carolyne Fiscal, Debi Read Back and Verified: Yes Diagnosis Coding Wound Cleansing Wound #1 Right Toe Great o Clean wound with Normal Saline. o May Shower, gently pat wound dry prior to applying new dressing. Wound #2  Right Toe Second o Clean wound with Normal Saline. o May Shower, gently pat wound dry prior to applying new dressing. Wound #5 Left Toe Great o Clean wound with Normal Saline. o May Shower, gently pat wound dry prior to applying new dressing. Wound #6 Left Toe Second o Clean wound with Normal Saline. o May Shower, gently pat wound dry prior to applying new dressing. Wound #9 Right Toe Third o Clean wound with Normal Saline. o May Shower, gently pat wound dry prior to applying new dressing. Anesthetic (add to Medication List) Wound #1 Right Toe Great o Topical Lidocaine 4% cream applied to wound bed prior to debridement (In Clinic Only). Wound #2 Right Toe Second o Topical Lidocaine 4% cream applied to wound bed prior to debridement (In Clinic Only). Wound #5 Left Toe Great o Topical Lidocaine 4% cream applied to wound bed prior to debridement (In Clinic Only). Wound #6 Left Toe Second o Topical Lidocaine 4% cream applied to wound bed prior to debridement (In Clinic Only). Wound #9 Right Toe Third o Topical Lidocaine 4% cream applied to wound bed prior to debridement (In Clinic Only). Primary Wound Dressing Wound #1 Right Toe Great o Silvercel Non-Adherent Wound #2 Right Toe Second o Silvercel Non-Adherent Wound #5 Left Toe Great [name], [name] ([id number]) o Silvercel Non-Adherent Wound #6 Left Toe Second o Silvercel Non-Adherent Wound #9 Right Toe Third o Silvercel Non-Adherent Secondary Dressing Wound #1 Right Toe Great o ABD and Kerlix/Conform Wound #2 Right Toe Second o ABD  and Kerlix/Conform Wound #5 Left Toe Great o ABD and Kerlix/Conform Wound #6 Left Toe Second o ABD and Kerlix/Conform Wound #9 Right Toe Third o ABD and Kerlix/Conform Dressing Change Frequency Wound #1 Right Toe Great o Change dressing every day. Wound #2 Right Toe Second o Change dressing every day. Wound #5 Left Toe Great o Change  dressing every day. Wound #6 Left Toe Second o Change dressing every day. Wound #9 Right Toe Third o Change dressing every day. Follow-up Appointments Wound #1 Right Toe Great o Return Appointment in 1 week. Wound #2 Right Toe Second o Return Appointment in 1 week. Wound #5 Left Toe Great o Return Appointment in 1 week. Wound #6 Left Toe Second o Return Appointment in 1 week. Wound #9 Right Toe Third o Return Appointment in 1 week. GREY, RAKESTRAW ([id number]) Edema Control o 3 Layer Compression System - Bilateral - unna to anchor change weekly Additional Orders / Instructions Wound #1 Right Toe Great o Stop Smoking o Increase protein intake. o Other: - Please add vitamin A, vitamin C and zinc supplements to your diet Wound #2 Right Toe Second o Stop Smoking o Increase protein intake. o Other: - Please add vitamin A, vitamin C and zinc supplements to your diet Wound #5 Left Toe Great o Stop Smoking o Increase protein intake. o Other: - Please add vitamin A, vitamin C and zinc supplements to your diet Wound #6 Left Toe Second o Stop Smoking o Increase protein intake. o Other: - Please add vitamin A, vitamin C and zinc supplements to your diet Wound #9 Right Toe Third o Stop Smoking o Increase protein intake. o Other: - Please add vitamin A, vitamin C and zinc supplements to your diet Patient Medications Allergies: No Known Drug Allergies Notifications Medication Indication Start End lidocaine DOSE 1 - topical 4 % cream - 1 cream topical Electronic Signature(s) Signed: 06/20/2017 2:24:57 PM By: Alric Quan Signed: 06/21/2017 6:13:52 PM By: [name] Keeler PA-C Entered By: Alric Quan on 06/20/2017 10:07:24 Mink, [name] ([id number]) -------------------------------------------------------------------------------- Prescription 06/20/2017 Patient Name: [name] Brittle E. Provider: Worthy Keeler PA-C Date of Birth:  1951-03-13 NPI#: [id number] Sex: Jerilynn Mages DEA#: [id number] Phone #: [phone] License #: Patient Address: [facility] [place], [place] [address] [address], [place],  [address] [phone] Allergies No Known Drug Allergies Medication Medication: Route: Strength: Form: lidocaine topical 4% cream Class: TOPICAL LOCAL ANESTHETICS Dose: Frequency / Time: Indication: 1 1 cream topical Number of Refills: Number of Units: 0 Generic Substitution: Start Date: End Date: Administered at [facility]: Yes Time Administered: Time Discontinued: Note to Pharmacy: Signature(s): Date(s): Electronic Signature(s) Signed: 06/20/2017 2:24:57 PM By: Alric Quan Signed: 06/21/2017 6:13:52 PM By: [name] Keeler PA-C Entered By: Alric Quan on 06/20/2017 10:07:25 [name], [name] DOLLARD ([id number]) Tisdel, Les EMarland Kitchen ([id number]) --------------------------------------------------------------------------------  Problem List Details Patient Name: Morejon, Misael E. Date of Service: 06/20/2017 9:30 AM Medical Record Number: [id number] Patient Account Number: [id number] Date of Birth/Sex: 10-08-1950 (67 y.o. Male) Treating RN: [name] Primary Care Provider: Lamonte [name] Other Clinician: Referring Provider: Lamonte [name] Treating Provider/Extender: [name], Riann Oman [name] in Treatment: 37 Active Problems ICD-10 Encounter Code Description Active Date Diagnosis E11.621 Type 2 diabetes mellitus with foot ulcer 09/21/2016 Yes I87.313 Chronic venous hypertension (idiopathic) with ulcer of bilateral 09/21/2016 Yes lower extremity I89.0 Lymphedema, not elsewhere classified 09/21/2016 Yes L97.522 Non-pressure chronic ulcer of other part of left foot  with fat layer 09/21/2016 Yes exposed L97.512 Non-pressure chronic ulcer of other part of right foot with fat layer 09/21/2016  Yes exposed F17.218 Nicotine dependence, cigarettes, with other nicotine-induced 09/21/2016 Yes disorders F10.19 Alcohol abuse with unspecified alcohol-induced disorder 09/21/2016 Yes M86.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, right ankle and foot 02/21/2017 Yes Inactive Problems Resolved Problems Electronic Signature(s) Signed: 06/21/2017 6:13:52 PM By: [name] Keeler PA-C Entered By: [name] on 06/20/2017 15:56:44 Waiters, [name] ([id number]) -------------------------------------------------------------------------------- Progress Note Details Patient Name: Belter, Bernardo E. Date of Service: 06/20/2017 9:30 AM Medical Record Number: [id number] Patient Account Number: [id number] Date of Birth/Sex: 1950/10/04 (67 y.o. Male) Treating RN: [name] Primary Care Provider: Lamonte [name] Other Clinician: Referring Provider: Lamonte [name] Treating Provider/Extender: [name], Adelin Ventrella [name] in Treatment: 66 Subjective Chief Complaint Information obtained from Patient Patient presents for treatment of an open diabetic ulcer to both feet History of Present Illness (HPI) The following HPI elements were documented for the patient's wound: Location: bilateral feet ulceration on the toes Quality: Patient reports experiencing a dull pain to affected area(s). Severity: Patient states wound are getting better Duration: Patient has had the wound for > 3 months prior to seeking treatment at the wound center Timing: Pain in wound is constant (hurts all the time) Context: The wound would happen gradually Modifying Factors: Other treatment(s) tried include:treatment for lymphedema and is seen by the podiatrist [name] Associated Signs and Symptoms: Patient reports having increase swelling. 67 year old patient here to see as for bilateral feet ulceration to on his left first and second toe and 2 on his right first and second toe, which she's had for about 4 months. 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 The patient is also undergoing workup by medical oncology for a intra-abdominal lymphadenopathy. 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 He is also status post appendectomy, inguinal lymph node biopsy, rectal biopsy and rectal examination under anesthesia. he currently smokes cigarettes about half packet a day. In March of this year he was seen by [name], for evaluation of bilateral varicose veins and besides wearing compression stockings he had recommended laser ablation of the right and left great saphenous veins to eleviate the symptoms and complications of severe superficial venous reflux disease. He also recommended lymphedema pumps for better control of his lymphedema. The patient recently has had on 08/23/2016, right greater saphenous vein ablation with the laser energy Earlier lower extremity venous reflux examination done on 05/08/2016 showed no DVT or SVT both lower legs but incompetence of bilateral great saphenous veins was present. A lower arterial study was also done and there was no significant right lower and left lower extremity problems based on a normal toe brachial index bilaterally and the ABI was 1.21 the left and 1.23 on the right. His post ablation venous duplex examination showed successful ablation of the right GS vein with thrombus formation 2 below the right saphenofemoral junction. The deep system was patent without evidence of thrombosis and this was done on 08/30/2016. 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 Addendum: regarding his x-rays done today and x-ray of the  left foot -- IMPRESSION: No  objective evidence of osteomyelitis. There are soft tissue changes which may reflect cellulitis. X-ray of the right foot -- IMPRESSION:Findings compatible with cellulitis of the toes. No objective evidence of osteomyelitis is observed. 10/01/16 on evaluation today patient's wounds appeared to be doing some better. I did review the x-rays as well which showed no evidence of osteomyelitis although there was evidence on x-ray of cellulitis. He fortunately is not having any discomfort [name], [name] ([id number]) although he continues to have some swelling. He does not remember being on any antibiotics recently. 10/15/16 on evaluation today patient's wounds overall appear to be doing better although he does have a new location noted on the left foot. Fortunately he is not having significant pain. It almost has the appearance that something is rubbing on the end of his toes but he wears the open toe shoes and according to what he is telling me never wears anything that would rub on his foot. There is no evidence of infection and specifically no evidence of a fungal infection 10/22/16 On evaluation today patient's wounds appeared to be doing better compared to last week in regard to his bilateral lower extremities. Fortunately I happy with how things are progressing although he still has ulcers I feel like that he is improving and appropriate manner. 11/12/16 on evaluation today patient appears to be doing well in regard to his bilateral feet and the respective wounds. We have been using surrounding her dressings along with an antifungal cream which seems to be doing very well. He has no bilateral dysfunction noticed that the rituals are weight loss at this point. He also has no nausea or vomiting a note purulent discharge. He did see Vein and vascular today and he tells me that they told him he could have surgery for his venous stasis but they did not feel like it was [name] it in  his words. Fortunately patient's wounds do appear to be getting sneakily better. 11/26/2016 -- he says he is going to have some surgery during this week at [facility] for possibly a colon resection. 12/31/2016 -- the patient has been noncompliant with his smoking and I'm not sure whether he is also started drinking again. He continues to be very nonchalant about his care 01/14/2017 -- the patient's HandP has been reviewed well and I understand he is being compliant with trying to give up smoking and his local dressing changes. He does not have any surgical options of 4 to him by his vascular surgeons.he was last seen in early August by [name] who recommended compression stockings,and possibly lymph pumps in 2-3 months after doing a review ultrasound. 01/28/2017 - the patient did not have any fresh complaints but on examination I noted a large lacerated wound on the plantar aspect of his right fourth toe which had a lot of necrotic debris and it probes down to bone. 02/07/2017 -- x-ray of the right foot -- IMPRESSION: Soft tissue swelling about the first through fourth toes consistent with cellulitis. New destructive change in the tuft of the distal phalanx of the great toe is consistent with osteomyelitis. 02/14/2017 -- the patient's MRI is pending this coming Monday and he still continues to smoke. We have again gone over off loading of his wounds in great detail and he says he's been compliant. 02/21/2017 -- MR of the right foot -- IMPRESSION: 1. Soft tissue ulcer at the tip of the first, second and third toe knows. Cortical irregularity and bone marrow edema in the  first distal phalanx most concerning for osteomyelitis. Mild marrow edema in the second and third distal phalanx without definite cortical destruction which may reflect early osteomyelitis versus reactive marrow edema. 2. Soft tissue edema surrounding the first phalanx most consistent with cellulitis. the patient was  also recently evaluated by his medical oncologist Dr. 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 She is treating him with weekly Procrit. She is also keeping intra-abdominal lymphadenopathy and right lower lobe lung nodule under observation. 04/04/2017 -- he was seen by Dr. Adrian Prows on 03/25/2017 -- after review he empirically put him on ciprofloxacin and doxycycline as they have good bone penetration and good bioavailability and it will cover the usual pathogens and diabetic foot osteomyelitis. He will check inflammatory markers and plan a 83-79 week old records. C-reactive protein was 0.3 and the ESR was 72 04/18/17 on evaluation today patient appears to be doing about the same in regard to his lower extremity wounds bilaterally. He has continued to use the antifungal cream which does seem to be beneficial. Nonetheless the ulcers do seem to in some areas be epithelial eyes over and in other areas are still open. He is having no significant discomfort. 04/25/17-he is here in follow-up evaluation for multiple ulcerations to multiple toes bilaterally. He states he did see Dr. Ola Spurr again last week and continues antibiotic therapy. He is voicing no complaints or concerns, will continue with current treatment plan will possibility of adding compression therapy next week after an additional week of treatment/lotions to BLE prescribed by Dr Ola Spurr 05/02/17 he is here in follow up for for multiple ulcers to multiple toes bilaterally. we will stop using antifungal cream and will continue with silvercel and follow up next week [name], [name] ([id number]) 05/09/17-he is here in follow-up for multiple ulcerations to multiple toes bilaterally. There is improvement in appearance. He has not completely stopped using antifungal cream, but admits he has not using it between the toes. He has an appointment with Dr. Ola Spurr on  2/11, continues on doxycycline and Cipro. It has been 5 [name] of antibiotic therapy, we will order plain film xray to evaluate for osteomyelitis next week, prior to follow up with ID. Will continue with silvercel and follow up next week 05/16/17-he is here in follow-up evaluation for multiple ulcerations to multiple toes bilaterally and new wound to the right posterior heel. There is essentially no change in appearance, deteriorating measurements; he has a history of waxing and waning measurements. He admits that he continues to apply moisturizer/cream/ointment to his toes despite weekly reminders to only apply silvercel to his toes. He states that he thinks the surgical shoe contributed to the superficial ulcer to his posterior heel, he is unable to articulate if this was an area of dry cracked skin as he has a similar area to the left heel. He now is wearing open toed slippers. He has an appointment with Dr. Ola Spurr on 2/11. We have ordered x-rays for her bilateral feet; he was advised to obtain the x-rays today or tomorrow. He will follow-up next week 05/23/17-he is here in follow-up evaluation for multiple ulcerations to multiple toes bilaterally and the right posterior heel. There is improvement in maceration. He has been compliant and not applying any moisturizing agent to his toes. He has been using Lac-Hydrin for his lower extremities with improvement. He did not go to his appointment on Monday with Dr. Ola Spurr secondary to financial concerns.  X-rays for her bilateral feet showed: LEFT FOOT with slight erosion of the tuft of the distal phalanges of the left first and second toe suspicious for osteomyelitis, RIGHT FOOT with 1.erosion of the tufts of the distal phalanges of the right first second and possibly third toes consistent with osteomyelitis, 2 no definitive abnormality of the calcaneus is seen on the images obtained, 3. Plantar calcaneal degenerative spur. We briefly discussed  hyperbaric adjunctive therapy for treatment of chronic refractory osteomyelitis. I do not find an a1c in EMR, will contact PCP for record, or order if needed. He has been encouraged to contact Dr Ola Spurr office regarding the follow-up appointment, encouraged him to inquire about payment plan. We will continue with same treatment plan and follow-up next week. He states he is still taking antibiotics and has "a lot" left. He states he has been taking them as directed, 2 pills twice daily. According to Dr. Blane Ohara office notes he was originally started on 12/17 for 4 [name] and extended on 1/14 for an additional 4 [name]. He should be done with his antibiotic therapy, he was advised to bring his bottles and to his next appointment, we will contact pharmacy. 05/30/17-he is here in follow-up evaluation for multiple ulcerations to multiple toes bilaterally and the right posterior heel. He is accompanied by his brother-in-law. Wounds are stable. He has yet to make up with Dr. Ola Spurr. We contacted his PCP, with no record of recent A1c we will draw an A1c. His brother-in-law states that he was taken off all of his diabetic medication secondary to kidney function. 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 The antibiotics were counted and have approximately 2 [name] left, although they should be complete. The pill organizer reveals missing days. We discussed the need for consistent medications, to have optimal benefit of medication. He has a cousin that lives with him and he will ask her to check his organizer daily. He has been advised to follow up with Dr Ola Spurr, and will go by the office today. He has been advised to quit smoking. 06/06/17-he is here in follow up evaluation. He has had to make an appointment with Dr. Ola Spurr. He did have blood work obtained, a1c 5. He continues to take  antibiotic therapy. Significant improvement in bilateral lower extremity edema with compression therapy. Essentially no change in ulcerations to toes. He states he is "going to try something different" and "let me know next week" if it works; he would not provide any additional information and was encouraged to follow our orders. We will follow up next week 06/13/17-he is here in follow-up evaluation. He has an appointment with Dr. Ola Spurr tomorrow morning. He states he purchased an ointment from [facility] and applied to his toes for 3 days, he does not remember the name of the ointment. There is improvement to his wounds, minimal maceration. He continues to take antibiotic therapy, this should have been completed last month. His brother-in-law who regularly accompanies his appointments was asked to take the bottles to the appointment tomorrow with Dr. Ola Spurr so he is aware. We will continue with 3 layer compression, and order OPEN TOE compression 20-30mmHg; we will apply compression stockings next week. He continues to smoke, smoked "2 cigarettes" last week 06/20/17 on evaluation today patient did receive his compression stockings which he has with him today for both lower extremities. With that being said he tells me at this point in  time that he is very happy to have these he really is not a big fan of the compression wraps that we have been utilizing although they have been of great benefit for him. Nonetheless at this point he does want to switch to the compression stockings. In my opinion as long as he is continuing with compression I'm okay with the stockings or the wraps. Patient History Information obtained from Patient. Social History Current every day smoker, Marital Status - Widowed, Alcohol Use - Daily - quit drinking about a week ago, Drug Use - No History, Caffeine Use - Moderate. NOLAWI, KANADY ([id number]) Medical And Surgical History Notes Oncologic squamous cell cancer  of skin of buttock with unknown treatment Review of Systems (ROS) Constitutional Symptoms (General Health) Denies complaints or symptoms of Fever, Chills. Respiratory The patient has no complaints or symptoms. Cardiovascular The patient has no complaints or symptoms. Psychiatric The patient has no complaints or symptoms. Objective Constitutional Obese and well-hydrated in no acute distress. Vitals Time Taken: 9:37 AM, Height: 69 in, Weight: 168 lbs, BMI: 24.8, Temperature: 98.1 F, Pulse: 75 bpm, Respiratory Rate: 16 breaths/min, Blood Pressure: 141/85 mmHg. Respiratory normal breathing without difficulty. clear to auscultation bilaterally. Cardiovascular regular rate and rhythm with normal S1, S2. 1+ pitting edema of the bilateral lower extremities. Psychiatric this patient is able to make decisions and demonstrates good insight into disease process. Alert and Oriented x 3. pleasant and cooperative. General Notes: Patient's wounds do not require any sharp debridement today. Overall he appears to be stable and doing fairly well which is good news. Integumentary (Hair, Skin) Wound #1 status is Open. Original cause of wound was Gradually Appeared. The wound is located on the Right Toe Great. The wound measures 0.6cm length x 4cm width x 0.1cm depth; 1.885cm^2 area and 0.188cm^3 volume. There is Fat Layer (Subcutaneous Tissue) Exposed exposed. There is no tunneling or undermining noted. There is a large amount of serosanguineous drainage noted. The wound margin is flat and intact. There is large (67-100%) red granulation within the wound bed. There is a small (1-33%) amount of necrotic tissue within the wound bed including Eschar and Adherent Slough. The periwound skin appearance exhibited: Callus, Excoriation, Maceration. The periwound skin appearance did not exhibit: Crepitus, Induration, Rash, Scarring, Dry/Scaly, Atrophie Blanche, Cyanosis, Ecchymosis, Hemosiderin Staining,  Mottled, Pallor, Rubor, Erythema. Periwound temperature was noted as No Abnormality. Wound #2 status is Open. Original cause of wound was Gradually Appeared. The wound is located on the Right Toe Second. The wound measures 0.6cm length x 1.5cm width x 0.1cm depth; 0.707cm^2 area and 0.071cm^3 volume. There is Fat Layer (Subcutaneous Tissue) Exposed exposed. There is no tunneling or undermining noted. There is a large amount of serosanguineous drainage noted. The wound margin is flat and intact. There is large (67-100%) red granulation within the Leibensperger, Daimon E. ([id number]) wound bed. There is a small (1-33%) amount of necrotic tissue within the wound bed including Adherent Slough. The periwound skin appearance exhibited: Scarring, Maceration. The periwound skin appearance did not exhibit: Callus, Crepitus, Excoriation, Induration, Rash, Dry/Scaly, Atrophie Blanche, Cyanosis, Ecchymosis, Hemosiderin Staining, Mottled, Pallor, Rubor, Erythema. Periwound temperature was noted as No Abnormality. Wound #5 status is Open. Original cause of wound was Gradually Appeared. The wound is located on the Left Toe Great. The wound measures 0.1cm length x 0.1cm width x 0.1cm depth; 0.008cm^2 area and 0.001cm^3 volume. There is Fat Layer (Subcutaneous Tissue) Exposed exposed. There is no tunneling or undermining noted. There is a large  amount of serosanguineous drainage noted. Foul odor after cleansing was noted. The wound margin is flat and intact. There is medium (34-66%) red granulation within the wound bed. There is a small (1-33%) amount of necrotic tissue within the wound bed including Adherent Slough. The periwound skin appearance exhibited: Maceration. The periwound skin appearance did not exhibit: Callus, Crepitus, Excoriation, Induration, Rash, Scarring, Dry/Scaly, Atrophie Blanche, Cyanosis, Ecchymosis, Hemosiderin Staining, Mottled, Pallor, Rubor, Erythema. Periwound temperature was noted as No  Abnormality. Wound #6 status is Open. Original cause of wound was Gradually Appeared. The wound is located on the Left Toe Second. The wound measures 0.8cm length x 0.9cm width x 0.1cm depth; 0.565cm^2 area and 0.057cm^3 volume. There is Fat Layer (Subcutaneous Tissue) Exposed exposed. There is no tunneling or undermining noted. There is a large amount of serosanguineous drainage noted. The wound margin is flat and intact. There is large (67-100%) red granulation within the wound bed. There is a small (1-33%) amount of necrotic tissue within the wound bed including Adherent Slough. The periwound skin appearance exhibited: Maceration. The periwound skin appearance did not exhibit: Callus, Crepitus, Excoriation, Induration, Rash, Scarring, Dry/Scaly, Atrophie Blanche, Cyanosis, Ecchymosis, Hemosiderin Staining, Mottled, Pallor, Rubor, Erythema. Periwound temperature was noted as No Abnormality. Wound #9 status is Open. Original cause of wound was Gradually Appeared. The wound is located on the Right Toe Third. The wound measures 0.1cm length x 0.1cm width x 0.1cm depth; 0.008cm^2 area and 0.001cm^3 volume. There is Fat Layer (Subcutaneous Tissue) Exposed exposed. There is no tunneling or undermining noted. There is a small amount of serous drainage noted. The wound margin is flat and intact. There is large (67-100%) red granulation within the wound bed. There is a small (1-33%) amount of necrotic tissue within the wound bed including Adherent Slough. The periwound skin appearance did not exhibit: Callus, Crepitus, Excoriation, Induration, Rash, Scarring, Dry/Scaly, Maceration, Atrophie Blanche, Cyanosis, Ecchymosis, Hemosiderin Staining, Mottled, Pallor, Rubor, Erythema. Periwound temperature was noted as No Abnormality. Assessment Active Problems ICD-10 E11.621 - Type 2 diabetes mellitus with foot ulcer I87.313 - Chronic venous hypertension (idiopathic) with ulcer of bilateral lower  extremity I89.0 - Lymphedema, not elsewhere classified L97.522 - Non-pressure chronic ulcer of other part of left foot with fat layer exposed L97.512 - Non-pressure chronic ulcer of other part of right foot with fat layer exposed F17.218 - Nicotine dependence, cigarettes, with other nicotine-induced disorders F10.19 - Alcohol abuse 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 - Chronic multifocal osteomyelitis, right ankle and foot Plan Wound Cleansing: Wound #1 Right Toe Great: Clean wound with Normal Saline. BRITTANY, AMIRAULT ([id number]) May Shower, gently pat wound dry prior to applying new dressing. Wound #2 Right Toe Second: Clean wound with Normal Saline. May Shower, gently pat wound dry prior to applying new dressing. Wound #5 Left Toe Great: Clean wound with Normal Saline. May Shower, gently pat wound dry prior to applying new dressing. Wound #6 Left Toe Second: Clean wound with Normal Saline. May Shower, gently pat wound dry prior to applying new dressing. Wound #9 Right Toe Third: Clean wound with Normal Saline. May Shower, gently pat wound dry prior to applying new dressing. Anesthetic (add to Medication List): Wound #1 Right Toe Great: Topical Lidocaine 4% cream applied to wound bed prior to debridement (In Clinic Only). Wound #2 Right Toe Second: Topical Lidocaine 4% cream applied to wound bed prior to debridement (In Clinic Only). Wound #5 Left Toe Great: Topical Lidocaine 4% cream applied to wound bed prior to debridement (In Clinic  Only). Wound #6 Left Toe Second: Topical Lidocaine 4% cream applied to wound bed prior to debridement (In Clinic Only). Wound #9 Right Toe Third: Topical Lidocaine 4% cream applied to wound bed prior to debridement (In Clinic Only). Primary Wound Dressing: Wound #1 Right Toe Great: Silvercel Non-Adherent Wound #2 Right Toe Second: Silvercel Non-Adherent Wound #5 Left Toe Great: Silvercel Non-Adherent Wound #6 Left Toe  Second: Silvercel Non-Adherent Wound #9 Right Toe Third: Silvercel Non-Adherent Secondary Dressing: Wound #1 Right Toe Great: ABD and Kerlix/Conform Wound #2 Right Toe Second: ABD and Kerlix/Conform Wound #5 Left Toe Great: ABD and Kerlix/Conform Wound #6 Left Toe Second: ABD and Kerlix/Conform Wound #9 Right Toe Third: ABD and Kerlix/Conform Dressing Change Frequency: Wound #1 Right Toe Great: Change dressing every day. Wound #2 Right Toe Second: Change dressing every day. Wound #5 Left Toe Great: Change dressing every day. Wound #6 Left Toe Second: Change dressing every day. Wound #9 Right Toe Third: Change dressing every day. Follow-up Appointments: Wound #1 Right Toe Great: Return Appointment in 1 week. TARVARIS, PUGLIA ([id number]) Wound #2 Right Toe Second: Return Appointment in 1 week. Wound #5 Left Toe Great: Return Appointment in 1 week. Wound #6 Left Toe Second: Return Appointment in 1 week. Wound #9 Right Toe Third: Return Appointment in 1 week. Edema Control: 3 Layer Compression System - Bilateral - unna to anchor change weekly Additional Orders / Instructions: Wound #1 Right Toe Great: Stop Smoking Increase protein intake. Other: - Please add vitamin A, vitamin C and zinc supplements to your diet Wound #2 Right Toe Second: Stop Smoking Increase protein intake. Other: - Please add vitamin A, vitamin C and zinc supplements to your diet Wound #5 Left Toe Great: Stop Smoking Increase protein intake. Other: - Please add vitamin A, vitamin C and zinc supplements to your diet Wound #6 Left Toe Second: Stop Smoking Increase protein intake. Other: - Please add vitamin A, vitamin C and zinc supplements to your diet Wound #9 Right Toe Third: Stop Smoking Increase protein intake. Other: - Please add vitamin A, vitamin C and zinc supplements to your diet The following medication(s) was prescribed: lidocaine topical 4 % cream 1 1 cream topical was prescribed  at facility I am going to suggest that we at this point initiate treatment with his compression stockings and we will see how things go over the next week. Patient is in agreement with this plan. I did explain he will need to use these on a daily basis he understands. Subsequently we will continue with the current orders per above for his wounds. Please see above for specific wound care orders. We will see patient for re-evaluation in 1 week(s) here in the clinic. If anything worsens or changes patient will contact our office for additional recommendations. Electronic Signature(s) Signed: 06/21/2017 6:13:52 PM By: [name] Keeler PA-C Entered By: [name] on 06/20/2017 15:58:42 Mckamey, [name] ([id number]) -------------------------------------------------------------------------------- ROS/PFSH Details Patient Name: [name], [name] Date of Service: 06/20/2017 9:30 AM Medical Record Number: [id number] Patient Account Number: [id number] Date of Birth/Sex: 08/27/1950 (67 y.o. Male) Treating RN: [name] Primary Care Provider: Lamonte [name] Other Clinician: Referring Provider: Lamonte [name] Treating Provider/Extender: [name], Landyn Lorincz [name] in Treatment: 59 Information Obtained From Patient Wound History Do you currently have one or more open woundso Yes How many open wounds do you currently haveo 6 Approximately how long have you had your woundso 3 months How have you been treating your wound(s) until nowo ointment and bandage  Has your wound(s) ever healed and then re-openedo No Have you had any lab work done in the past montho No Have you tested positive for an antibiotic resistant organism (MRSA, VRE)o No Have you tested positive for osteomyelitis (bone infection)o No Have you had any tests for circulation on your legso Yes Who ordered the testo PCP Where was the test doneo [facility] Constitutional Symptoms (General Health) Complaints and Symptoms: Negative for: Fever;  Chills Eyes Medical History: Negative for: Cataracts; Glaucoma; Optic Neuritis Ear/Nose/Mouth/Throat Medical History: Negative for: Chronic sinus problems/congestion; Middle ear problems Hematologic/Lymphatic Medical History: Positive for: Anemia; Lymphedema Negative for: Hemophilia; Human Immunodeficiency Virus; Sickle Cell Disease Respiratory Complaints and Symptoms: No Complaints or Symptoms Medical History: Negative for: Aspiration; Asthma; Chronic Obstructive Pulmonary Disease (COPD); Pneumothorax; Sleep Apnea; Tuberculosis Cardiovascular Complaints and Symptoms: No Complaints or Symptoms Laurich, Jaeven E. ([id number]) Medical History: Positive for: Congestive Heart Failure; Hypertension; Peripheral Venous Disease Negative for: Angina; Arrhythmia; Coronary Artery Disease; Deep Vein Thrombosis; Hypotension; Myocardial Infarction; Peripheral Arterial Disease; Phlebitis; Vasculitis Gastrointestinal Medical History: Negative for: Cirrhosis ; Colitis; Crohnos; Hepatitis A; Hepatitis B; Hepatitis C Endocrine Medical History: Positive for: Type II Diabetes Treated with: Oral agents Blood sugar tested every day: Yes Tested : QD Genitourinary Medical History: Negative for: End Stage Renal Disease Immunological Medical History: Negative for: Lupus Erythematosus; Raynaudos; Scleroderma Integumentary (Skin) Medical History: Negative for: History of Burn; History of pressure wounds Musculoskeletal Medical History: Negative for: Gout; Rheumatoid Arthritis; Osteoarthritis; Osteomyelitis Neurologic Medical History: Positive for: Neuropathy Negative for: Dementia; Quadriplegia; Paraplegia; Seizure Disorder Oncologic Medical History: Past Medical History Notes: 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  ([id number]) Immunization Notes: up to date Implantable Devices Family and Social History Current every day smoker; Marital Status - Widowed; Alcohol Use: Daily - quit drinking about a week ago; Drug Use: No History; Caffeine Use: Moderate; Financial Concerns: No; Food, Clothing or Shelter Needs: No; Support System Lacking: No; Transportation Concerns: No; Advanced Directives: No; Patient does not want information on Advanced Directives Physician Affirmation I have reviewed and agree with the above information. Electronic Signature(s) Signed: 06/21/2017 3:16:46 PM By: Alric Quan Signed: 06/21/2017 6:13:52 PM By: [name] Keeler PA-C Entered By: [name] on 06/20/2017 15:57:53 Cressy, [name] ([id number]) -------------------------------------------------------------------------------- SuperBill Details Patient Name: Barabas, Aravind E. Date of Service: 06/20/2017 Medical Record Number: [id number] Patient Account Number: [id number] Date of Birth/Sex: [birth year] (67 y.o. Male) Treating RN: [name] Primary Care Provider: Lamonte [name] Other Clinician: Referring Provider: Lamonte [name] Treating Provider/Extender: [name], Ravan Schlemmer [name] in Treatment: 73 Diagnosis Coding ICD-10 Codes Code Description E11.621 Type 2 diabetes mellitus with foot ulcer I87.313 Chronic venous hypertension (idiopathic) with ulcer of bilateral lower extremity I89.0 Lymphedema, not elsewhere classified L97.522 Non-pressure chronic ulcer of other part of left foot with fat layer exposed L97.512 Non-pressure chronic ulcer of other part of right foot with fat layer exposed F17.218 Nicotine dependence, cigarettes, 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 abuse 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, right ankle and foot Facility Procedures CPT4 Code: [id number] Description: 66440 - WOUND CARE VISIT-LEV 5 EST PT Modifier: Quantity: 1 Physician  Procedures CPT4 Code Description: 3474259 [id number] - WC PHYS LEVEL 3 - EST PT ICD-10 Diagnosis Description E11.621 Type 2 diabetes mellitus with foot ulcer I87.313 Chronic venous hypertension (idiopathic) with ulcer of bilateral I89.0 Lymphedema, not elsewhere  classified L97.522 Non-pressure  chronic ulcer of other part of left foot with fat l Modifier: lower extremi ayer exposed Quantity: 1 ty Electronic Signature(s) Signed: 06/21/2017 6:13:52 PM By: [name] Keeler PA-C Entered By: [name] on 06/20/2017 15:59:13

## 2017-06-27 ENCOUNTER — Inpatient Hospital Stay (HOSPITAL_BASED_OUTPATIENT_CLINIC_OR_DEPARTMENT_OTHER): Payer: Medicare HMO | Admitting: Oncology

## 2017-06-27 ENCOUNTER — Inpatient Hospital Stay: Payer: Medicare HMO

## 2017-06-27 ENCOUNTER — Encounter: Payer: Self-pay | Admitting: Oncology

## 2017-06-27 ENCOUNTER — Encounter: Payer: Medicare HMO | Admitting: Nurse Practitioner

## 2017-06-27 VITALS — BP 150/88 | HR 63 | Temp 97.6°F | Resp 18 | Ht 69.0 in | Wt 177.9 lb

## 2017-06-27 DIAGNOSIS — D631 Anemia in chronic kidney disease: Secondary | ICD-10-CM

## 2017-06-27 DIAGNOSIS — Z923 Personal history of irradiation: Secondary | ICD-10-CM | POA: Diagnosis not present

## 2017-06-27 DIAGNOSIS — I130 Hypertensive heart and chronic kidney disease with heart failure and stage 1 through stage 4 chronic kidney disease, or unspecified chronic kidney disease: Secondary | ICD-10-CM

## 2017-06-27 DIAGNOSIS — D472 Monoclonal gammopathy: Secondary | ICD-10-CM | POA: Diagnosis not present

## 2017-06-27 DIAGNOSIS — N189 Chronic kidney disease, unspecified: Secondary | ICD-10-CM

## 2017-06-27 DIAGNOSIS — D638 Anemia in other chronic diseases classified elsewhere: Secondary | ICD-10-CM

## 2017-06-27 DIAGNOSIS — L97522 Non-pressure chronic ulcer of other part of left foot with fat layer exposed: Secondary | ICD-10-CM | POA: Diagnosis not present

## 2017-06-27 DIAGNOSIS — Z79899 Other long term (current) drug therapy: Secondary | ICD-10-CM

## 2017-06-27 DIAGNOSIS — F17210 Nicotine dependence, cigarettes, uncomplicated: Secondary | ICD-10-CM

## 2017-06-27 DIAGNOSIS — Z85828 Personal history of other malignant neoplasm of skin: Secondary | ICD-10-CM

## 2017-06-27 DIAGNOSIS — D509 Iron deficiency anemia, unspecified: Secondary | ICD-10-CM

## 2017-06-27 LAB — CBC WITH DIFFERENTIAL/PLATELET
BASOS PCT: 1 %
Basophils Absolute: 0 10*3/uL (ref 0–0.1)
EOS ABS: 0.5 10*3/uL (ref 0–0.7)
Eosinophils Relative: 9 %
HCT: 28 % — ABNORMAL LOW (ref 40.0–52.0)
Hemoglobin: 9.4 g/dL — ABNORMAL LOW (ref 13.0–18.0)
LYMPHS ABS: 1 10*3/uL (ref 1.0–3.6)
Lymphocytes Relative: 20 %
MCH: 27.3 pg (ref 26.0–34.0)
MCHC: 33.4 g/dL (ref 32.0–36.0)
MCV: 82 fL (ref 80.0–100.0)
Monocytes Absolute: 0.5 10*3/uL (ref 0.2–1.0)
Monocytes Relative: 10 %
NEUTROS PCT: 60 %
Neutro Abs: 3.3 10*3/uL (ref 1.4–6.5)
PLATELETS: 185 10*3/uL (ref 150–440)
RBC: 3.42 MIL/uL — AB (ref 4.40–5.90)
RDW: 16.1 % — ABNORMAL HIGH (ref 11.5–14.5)
WBC: 5.4 10*3/uL (ref 3.8–10.6)

## 2017-06-27 LAB — COMPREHENSIVE METABOLIC PANEL
ALK PHOS: 75 U/L (ref 38–126)
ALT: 30 U/L (ref 17–63)
AST: 44 U/L — AB (ref 15–41)
Albumin: 2.6 g/dL — ABNORMAL LOW (ref 3.5–5.0)
Anion gap: 6 (ref 5–15)
BILIRUBIN TOTAL: 0.5 mg/dL (ref 0.3–1.2)
BUN: 35 mg/dL — ABNORMAL HIGH (ref 6–20)
CALCIUM: 8.1 mg/dL — AB (ref 8.9–10.3)
CHLORIDE: 105 mmol/L (ref 101–111)
CO2: 20 mmol/L — ABNORMAL LOW (ref 22–32)
CREATININE: 1.88 mg/dL — AB (ref 0.61–1.24)
GFR, EST AFRICAN AMERICAN: 42 mL/min — AB (ref >60)
GFR, EST NON AFRICAN AMERICAN: 36 mL/min — AB (ref >60)
Glucose, Bld: 203 mg/dL — ABNORMAL HIGH (ref 65–99)
Potassium: 4.9 mmol/L (ref 3.5–5.1)
Sodium: 131 mmol/L — ABNORMAL LOW (ref 135–145)
Total Protein: 7.1 g/dL (ref 6.5–8.1)

## 2017-06-27 MED ORDER — DARBEPOETIN ALFA 100 MCG/0.5ML IJ SOSY
100.0000 ug | PREFILLED_SYRINGE | INTRAMUSCULAR | Status: DC
  Administered 2017-06-27: 100 ug via SUBCUTANEOUS
  Filled 2017-06-27: qty 0.5

## 2017-06-27 NOTE — Progress Notes (Signed)
No new changes noted today 

## 2017-06-28 LAB — KAPPA/LAMBDA LIGHT CHAINS
Kappa free light chain: 155 mg/L — ABNORMAL HIGH (ref 3.3–19.4)
Kappa, lambda light chain ratio: 1.93 — ABNORMAL HIGH (ref 0.26–1.65)
LAMDA FREE LIGHT CHAINS: 80.4 mg/L — AB (ref 5.7–26.3)

## 2017-06-28 NOTE — Progress Notes (Signed)
Hematology/Oncology Consult note [facility]  [phone] [phone]  Patient Care Team: [name], MD as PCP - General (Internal Medicine)   Name of the patient: [name]  [id number]  24-Aug-1950   Date of visit: 06/28/17  Diagnosis- 1. Anemia- multifactorial- iron deficiency, chronic disease and anemia of kidney disease  2. 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. 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 Baruch Gouty. Patient had CT abdomen in July 2016 which showed retroperitoneal, celiac and gastric adenopathy. MR pelvis in Oct 2017 showed b/l inguinal and external iliac adenopathy as well. Inguinal LN biopsy was negative for malignancy.   2. Biopsy of the skin lesion showed: DIAGNOSIS:  A. PERIANAL POLYP, LEFT; EXCISION:  - SKIN WITH POLYPOID VASCULAR PROLIFERATION WITH SUPERFICIAL NECROSIS,  SEE COMMENT.   B. PERIANAL MASS, RIGHT; INCISIONAL BIOPSY:  - INVASIVE CARCINOMA WITH BASALOID FEATURES.  - SEE COMMENT.   Comment #1:  The differential diagnosis for the perianal polyp includes pyogenic  granuloma and ulcerated hemangioma.   Comment #2:  Immunohistochemical stains were performed. The carcinoma is positive for  p16, BerEp4, and BCL-2. Stain controls worked appropriately.  Unfortunately the pattern of staining does not assist with further  classification of the carcinoma. Clinical correlation is required to  determine if the lesion involves perianal skin primarily (BCC) or is  arising in the anal canal (squamous cell carcinoma with basaloid  features).   This was followed by RT to the lesion.  3. He had repeat CT abdomen in feb 2018 which showed: IMPRESSION: Progressive hepatic cirrhosis since prior  study. No evidence of hepatic neoplasm.  New moderate ascites, diffuse mesenteric and body wall edema, and small bilateral pleural effusions. Mild diffuse small bowel wall thickening, consistent with hypoalbuminemia.  Findings consistent with portal venous hypertension, including mild splenomegaly and upper abdominal venous collaterals.  Stable mild abdominal lymphadenopathy. Mild increase in bilateral iliac and inguinal lymphadenopathy in the pelvis, which may be reactive in etiology, with metastatic disease considered less Likely.  4. He underwent egd and colonoscopy for his microcytic anemia which showed no overt bleeding lesions or malignancy  5. CBC on 09/18/2016 showed H&H of 6.5/19.8 and MCV of 86.7. Labs suggestive of iron deficiency anemia. B12 and folate was within normal limits. Multiple myeloma panel showed IGG monoclonal protein of 0.9 gm. Polyclonal gammopathyHaptoglobin and no copper was normal. Reticulocyte count was mildly elevated at 7.9. Patient received 2 doses of ferriheme on 620 and 10/03/2016. He also received 1 unit of blood transfusion  6. EGD showed a normal duodenum and normal esophagus. Found to have gastritis which was biopsied. Also found to have a large polypoid mass in the left piriform sinus and nonobstructing the airway. This was subsequently evaluated by ENT which did not find any evidence of mass. The colonoscopy showed appendixthat was appearing protruding and ordered this blood with stool. No mucus seen in the vicinity. He was evaluated by a [facility] and has been considered for a repeat colonoscopy versus endoscopy mucosal resection of the mucocele  7.Bone marrow biopsy showed hypercellular marrow for age. 9% plasma cells. Clonality of cells could not be evaluated because of lack of clot sections available for staining. Cytogenetics were normal. FISH studies were  negative for any chromosomal abnormalities as well.  8. Currently on weekly  procrit for anemia of chronic kidney disease which has been stable sround 10  Interval history- reports feeling well. Denies any blood loss in stool or urine. Has mild chronic fatigue. Ascites has not re accumulated  ECOG PS- 1-2 Pain scale- 0   Review of systems- Review of Systems  Constitutional: Positive for malaise/fatigue. Negative for chills, fever and weight loss.  HENT: Negative for congestion, ear discharge and nosebleeds.   Eyes: Negative for blurred vision.  Respiratory: Negative for cough, hemoptysis, sputum production, shortness of breath and wheezing.   Cardiovascular: Negative for chest pain, palpitations, orthopnea and claudication.  Gastrointestinal: Negative for abdominal pain, blood in stool, constipation, diarrhea, heartburn, melena, nausea and vomiting.  Genitourinary: Negative for dysuria, flank pain, frequency, hematuria and urgency.  Musculoskeletal: Negative for back pain, joint pain and myalgias.  Skin: Negative for rash.  Neurological: Negative for dizziness, tingling, focal weakness, seizures, weakness and headaches.  Endo/Heme/Allergies: Does not bruise/bleed easily.  Psychiatric/Behavioral: Negative for depression and suicidal ideas. The patient does not have insomnia.      No Known Allergies   Past Medical History:  Diagnosis Date  . Anemia   . CHF (congestive heart failure) ([place])   . Diabetes mellitus    Patient take Metformin.  Marland Kitchen GERD (gastroesophageal reflux disease)   . Hypertension   . Leg swelling   . Loose, teeth    PATIENT STATES "HAS 6 TEETH, SOME ARE LOOSE"  . Neuromuscular disorder ([place])    RIGHT HAND AND FINGERS NUMB  . Shortness of breath dyspnea   . 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 02/24/2016     Past Surgical History:  Procedure Laterality Date  . APPENDECTOMY    . COLONOSCOPY WITH PROPOFOL N/A 11/19/2014   Procedure: COLONOSCOPY WITH PROPOFOL;  Surgeon: Lucilla Lame, MD;  Location: [facility];  Service:  Endoscopy;  Laterality: N/A;  DIABETIC-ORAL MEDS  . COLONOSCOPY WITH PROPOFOL N/A 10/04/2016   Procedure: COLONOSCOPY WITH PROPOFOL;  Surgeon: Jonathon Bellows, MD;  Location: [facility] ENDOSCOPY;  Service: Endoscopy;  Laterality: N/A;  . ESOPHAGOGASTRODUODENOSCOPY (EGD) WITH PROPOFOL N/A 10/04/2016   Procedure: ESOPHAGOGASTRODUODENOSCOPY (EGD) WITH PROPOFOL;  Surgeon: Jonathon Bellows, MD;  Location: [facility] ENDOSCOPY;  Service: Endoscopy;  Laterality: N/A;  . GIVENS CAPSULE STUDY N/A 11/13/2016   Procedure: GIVENS CAPSULE STUDY;  Surgeon: Jonathon Bellows, MD;  Location: [facility] ENDOSCOPY;  Service: Gastroenterology;  Laterality: N/A;  . INGUINAL LYMPH NODE BIOPSY Right 01/26/2016   Procedure: INGUINAL LYMPH NODE BIOPSY;  Surgeon: Clayburn Pert, MD;  Location: [facility] ORS;  Service: General;  Laterality: Right;  . RECTAL BIOPSY N/A 11/02/2014   Procedure: BIOPSY RECTAL;  Surgeon: Marlyce Huge, MD;  Location: [facility] ORS;  Service: General;  Laterality: N/A;  . RECTAL EXAM UNDER ANESTHESIA N/A 11/02/2014   Procedure: RECTAL EXAM UNDER ANESTHESIA;  Surgeon: Marlyce Huge, MD;  Location: [facility] ORS;  Service: General;  Laterality: N/A;  . TONSILLECTOMY      Social History   Socioeconomic History  . Marital status: Divorced    Spouse name: Not on file  . Number of children: Not on file  . Years of education: Not on file  . Highest education level: Not on file  Occupational History  . Not on file  Social Needs  . Financial resource strain: Not on file  . Food insecurity:    Worry: Not on file    Inability: Not on file  .  Transportation needs:    Medical: Not on file    Non-medical: Not on file  Tobacco Use  . Smoking status: Current Every Day Smoker    Packs/day: 0.25    Years: 20.00    Pack years: 5.00    Types: Cigarettes  . Smokeless tobacco: Never Used  . Tobacco comment: pt claims no smoking x 3 d  Substance and Sexual Activity  . Alcohol use: No    Comment: per pt used to drink beer daily-  quit may/2018  . Drug use: Yes    Types: Marijuana    Comment: Last Use 2016 per patient  . Sexual activity: Never  Lifestyle  . Physical activity:    Days per week: Not on file    Minutes per session: Not on file  . Stress: Not on file  Relationships  . Social connections:    Talks on phone: Not on file    Gets together: Not on file    Attends religious service: Not on file    Active member of club or organization: Not on file    Attends meetings of clubs or organizations: Not on file    Relationship status: Not on file  . Intimate partner violence:    Fear of current or ex partner: Not on file    Emotionally abused: Not on file    Physically abused: Not on file    Forced sexual activity: Not on file  Other Topics Concern  . Not on file  Social History Narrative  . Not on file    Family History  Problem Relation Age of Onset  . Asthma Mother      Current Outpatient Medications:  .  amLODipine (NORVASC) 10 MG tablet, Take by mouth., Disp: , Rfl:  .  aspirin EC 81 MG tablet, Take 81 mg by mouth daily., Disp: , Rfl:  .  atorvastatin (LIPITOR) 40 MG tablet, Take 40 mg by mouth at bedtime., Disp: , Rfl:  .  chlorthalidone (HYGROTON) 25 MG tablet, Take by mouth., Disp: , Rfl:  .  ferrous sulfate 325 (65 FE) MG tablet, Take by mouth., Disp: , Rfl:  .  hydrALAZINE (APRESOLINE) 25 MG tablet, Take 25 mg 2 (two) times daily by mouth., Disp: , Rfl:  .  omeprazole (PRILOSEC) 40 MG capsule, Take by mouth., Disp: , Rfl:  .  polyethylene glycol (MIRALAX / GLYCOLAX) packet, Take by mouth., Disp: , Rfl:  .  vitamin B-12 (CYANOCOBALAMIN) 1000 MCG tablet, Take 1 tablet (1,000 mcg total) by mouth daily. (Patient not taking: Reported on 06/27/2017), Disp: 30 tablet, Rfl: 3  Current Facility-Administered Medications:  .  albumin human 25 % solution 50 g, 50 g, Intravenous, Once, Jonathon Bellows, MD .  albumin human 25 % solution 50 g, 50 g, Intravenous, Once, Jonathon Bellows,  MD  Facility-Administered Medications Ordered in Other Visits:  .  Darbepoetin Alfa (ARANESP) injection 100 mcg, 100 mcg, Subcutaneous, Q30 days, Sindy Guadeloupe, MD, 100 mcg at 06/27/17 1511 .  epoetin alfa (EPOGEN,PROCRIT) injection 40,000 Units, 40,000 Units, Subcutaneous, Weekly, Sindy Guadeloupe, MD, 40,000 Units at 05/23/17 1145  Physical exam:  Vitals:   06/27/17 1359  BP: (!) 150/88  Pulse: 63  Resp: 18  Temp: 97.6 F (36.4 C)  TempSrc: Tympanic  SpO2: 98%  Weight: 177 lb 14.4 oz (80.7 kg)  Height: 5' 9" (1.753 m)   Physical Exam  Constitutional: He is oriented to person, place, and time and well-developed, well-nourished, and in  no distress.  HENT:  Head: Normocephalic and atraumatic.  Eyes: Pupils are equal, round, and reactive to light. EOM are normal.  Neck: Normal range of motion.  Cardiovascular: Normal rate, regular rhythm and normal heart sounds.  Pulmonary/Chest: Effort normal and breath sounds normal.  Abdominal: Soft. Bowel sounds are normal.  Musculoskeletal:  Compression stockings in place  Neurological: He is alert and oriented to person, place, and time.  Skin: Skin is warm and dry.     CMP Latest Ref Rng & Units 06/27/2017  Glucose 65 - 99 mg/dL 203(H)  BUN 6 - 20 mg/dL 35(H)  Creatinine 0.61 - 1.24 mg/dL 1.88(H)  Sodium 135 - 145 mmol/L 131(L)  Potassium 3.5 - 5.1 mmol/L 4.9  Chloride 101 - 111 mmol/L 105  CO2 22 - 32 mmol/L 20(L)  Calcium 8.9 - 10.3 mg/dL 8.1(L)  Total Protein 6.5 - 8.1 g/dL 7.1  Total Bilirubin 0.3 - 1.2 mg/dL 0.5  Alkaline Phos 38 - 126 U/L 75  AST 15 - 41 U/L 44(H)  ALT 17 - 63 U/L 30   CBC Latest Ref Rng & Units 06/27/2017  WBC 3.8 - 10.6 K/uL 5.4  Hemoglobin 13.0 - 18.0 g/dL 9.4(L)  Hematocrit 40.0 - 52.0 % 28.0(L)  Platelets 150 - 440 K/uL 185      Assessment and plan- Patient is a 67 y.o. male with normocytic anemia multifactorial - anemia of chronic kidney disease along with anemia of chronic disease and iron  deficiency  Patient has responded well to weekly procrit so far. Without procrit his hb at times has dropped to <7 and he has required blood transfsuions. Iw ill switch him to monthly aranesp 100 mcg at this time. 1st dose today. He will get dose at 1, 2 and 3 months if hb <10. I will see him  Back in 3 months with cbc/cmp   IgG MGUS- monoclonal protein stable at 0.9 gm. FLC ratio is around 2. M protein from today is pending. If FLC or M protein worsens, I will consider repeat bone marrow biopsy at that time    Visit Diagnosis 1. Anemia of chronic renal failure, unspecified CKD stage      Dr. Randa Evens, MD, MPH [facility] Pager- [phone] 06/28/2017 3:46 PM

## 2017-07-01 LAB — MULTIPLE MYELOMA PANEL, SERUM
ALBUMIN SERPL ELPH-MCNC: 2.6 g/dL — AB (ref 2.9–4.4)
Albumin/Glob SerPl: 0.7 (ref 0.7–1.7)
Alpha 1: 0.2 g/dL (ref 0.0–0.4)
Alpha2 Glob SerPl Elph-Mcnc: 0.8 g/dL (ref 0.4–1.0)
B-GLOBULIN SERPL ELPH-MCNC: 0.9 g/dL (ref 0.7–1.3)
GAMMA GLOB SERPL ELPH-MCNC: 2.2 g/dL — AB (ref 0.4–1.8)
GLOBULIN, TOTAL: 4.1 g/dL — AB (ref 2.2–3.9)
IGG (IMMUNOGLOBIN G), SERUM: 2457 mg/dL — AB (ref 700–1600)
IgA: 283 mg/dL (ref 61–437)
IgM (Immunoglobulin M), Srm: 106 mg/dL (ref 20–172)
M PROTEIN SERPL ELPH-MCNC: 1.1 g/dL — AB
TOTAL PROTEIN ELP: 6.7 g/dL (ref 6.0–8.5)

## 2017-07-11 ENCOUNTER — Encounter: Payer: Medicare HMO | Attending: Nurse Practitioner | Admitting: Nurse Practitioner

## 2017-07-11 DIAGNOSIS — E1140 Type 2 diabetes mellitus with diabetic neuropathy, unspecified: Secondary | ICD-10-CM | POA: Diagnosis not present

## 2017-07-11 DIAGNOSIS — N189 Chronic kidney disease, unspecified: Secondary | ICD-10-CM | POA: Diagnosis not present

## 2017-07-11 DIAGNOSIS — F17210 Nicotine dependence, cigarettes, uncomplicated: Secondary | ICD-10-CM | POA: Diagnosis not present

## 2017-07-11 DIAGNOSIS — M86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, right ankle and foot: Secondary | ICD-10-CM | POA: Diagnosis not present

## 2017-07-11 DIAGNOSIS — J449 Chronic obstructive pulmonary disease, unspecified: Secondary | ICD-10-CM | POA: Insufficient documentation

## 2017-07-11 DIAGNOSIS — Z85828 Personal history of other malignant neoplasm of skin: Secondary | ICD-10-CM | POA: Diagnosis not present

## 2017-07-11 DIAGNOSIS — E11621 Type 2 diabetes mellitus with foot ulcer: Secondary | ICD-10-CM | POA: Diagnosis present

## 2017-07-11 DIAGNOSIS — I509 Heart failure, unspecified: Secondary | ICD-10-CM | POA: Insufficient documentation

## 2017-07-11 DIAGNOSIS — I130 Hypertensive heart and chronic kidney disease with heart failure and stage 1 through stage 4 chronic kidney disease, or unspecified chronic kidney disease: Secondary | ICD-10-CM | POA: Insufficient documentation

## 2017-07-11 DIAGNOSIS — F1010 Alcohol abuse, uncomplicated: Secondary | ICD-10-CM | POA: Diagnosis not present

## 2017-07-11 DIAGNOSIS — E1169 Type 2 diabetes mellitus with other specified complication: Secondary | ICD-10-CM | POA: Diagnosis not present

## 2017-07-11 DIAGNOSIS — Z9049 Acquired absence of other specified parts of digestive tract: Secondary | ICD-10-CM | POA: Diagnosis not present

## 2017-07-11 DIAGNOSIS — D631 Anemia in chronic kidney disease: Secondary | ICD-10-CM | POA: Diagnosis not present

## 2017-07-11 DIAGNOSIS — E1151 Type 2 diabetes mellitus with diabetic peripheral angiopathy without gangrene: Secondary | ICD-10-CM | POA: Diagnosis not present

## 2017-07-11 DIAGNOSIS — I890 Lymphedema, not elsewhere classified: Secondary | ICD-10-CM | POA: Diagnosis not present

## 2017-07-11 DIAGNOSIS — L97522 Non-pressure chronic ulcer of other part of left foot with fat layer exposed: Secondary | ICD-10-CM | POA: Diagnosis not present

## 2017-07-11 DIAGNOSIS — E1122 Type 2 diabetes mellitus with diabetic chronic kidney disease: Secondary | ICD-10-CM | POA: Insufficient documentation

## 2017-07-12 NOTE — Progress Notes (Signed)
ZAHKI, [name] ([id number]) Visit Report for 07/11/2017 Arrival Information Details Patient Name: [name], [name] Date of Service: 07/11/2017 9:00 AM Medical Record Number: [id number] Patient Account Number: [id number] Date of Birth/Sex: [birth year] (67 y.o. M) Treating RN: Montey Hora Primary Care Ferdie Bakken: Lamonte Sakai Other Clinician: Referring Jaece Ducharme: Lamonte Sakai Treating Vere Diantonio/Extender: Cathie Olden in Treatment: 27 Visit Information History Since Last Visit Added or deleted any medications: No Patient Arrived: Ambulatory Any new allergies or adverse reactions: No Arrival Time: 09:05 Had a fall or experienced change in No Accompanied By: brother activities of daily living that may affect Transfer Assistance: None risk of falls: Patient Identification Verified: Yes Signs or symptoms of abuse/neglect since last visito No Secondary Verification Process Completed: Yes Hospitalized since last visit: No Patient Requires Transmission-Based No Implantable device outside of the clinic excluding No Precautions: cellular tissue based products placed in the center Patient Has Alerts: Yes since last visit: Patient Alerts: DMII Has Dressing in Place as Prescribed: Yes Has Compression in Place as Prescribed: Yes Pain Present Now: No Electronic Signature(s) Signed: 07/11/2017 4:24:22 PM By: Montey Hora Entered By: Montey Hora on 07/11/2017 09:07:43 Single, [name] ([id number]) -------------------------------------------------------------------------------- Encounter Discharge Information Details Patient Name: [name], [name] Date of Service: 07/11/2017 9:00 AM Medical Record Number: [id number] Patient Account Number: [id number] Date of Birth/Sex: 07-24-1950 (67 y.o. M) Treating RN: Ahmed Prima Primary Care Derl Abalos: Lamonte Sakai Other Clinician: Referring Dayelin Balducci: Lamonte Sakai Treating Holdyn Poyser/Extender: Cathie Olden in Treatment: 34 Encounter Discharge  Information Items Discharge Pain Level: 0 Discharge Condition: Stable Ambulatory Status: Ambulatory Discharge Destination: Home Transportation: Private Auto Schedule Follow-up Appointment: Yes Medication Reconciliation completed and No provided to Patient/Care Lorre Opdahl: Provided on Clinical Summary of Care: 07/11/2017 Form Type Recipient Paper Patient ED Electronic Signature(s) Signed: 07/11/2017 2:53:14 PM By: Roger Shelter Entered By: Roger Shelter on 07/11/2017 09:52:04 Rake, [name] ([id number]) -------------------------------------------------------------------------------- Lower Extremity Assessment Details Patient Name: [name], [name] Date of Service: 07/11/2017 9:00 AM Medical Record Number: [id number] Patient Account Number: [id number] Date of Birth/Sex: 10-31-1950 (67 y.o. M) Treating RN: Montey Hora Primary Care Lenville Hibberd: Lamonte Sakai Other Clinician: Referring Nakeem Murnane: Lamonte Sakai Treating Christhoper Busbee/Extender: Cathie Olden in Treatment: 41 Edema Assessment Assessed: [Left: No] [Right: No] [Left: Edema] [Right: :] Calf Left: Right: Point of Measurement: 34 cm From Medial Instep 34.8 cm 35.3 cm Ankle Left: Right: Point of Measurement: 12 cm From Medial Instep 25.1 cm 25.1 cm Vascular Assessment Pulses: Dorsalis Pedis Palpable: [Left:Yes] [Right:Yes] Posterior Tibial Extremity colors, hair growth, and conditions: Extremity Color: [Left:Hyperpigmented] [Right:Hyperpigmented] Hair Growth on Extremity: [Left:No] [Right:No] Temperature of Extremity: [Left:Warm] [Right:Warm] Capillary Refill: [Left:< 3 seconds] [Right:< 3 seconds] Electronic Signature(s) Signed: 07/11/2017 4:24:22 PM By: Montey Hora Entered By: Montey Hora on 07/11/2017 09:11:59 Delgreco, Acea E. ([id number]) -------------------------------------------------------------------------------- Multi Wound Chart Details Patient Name: [name], [name] Date of Service: 07/11/2017 9:00  AM Medical Record Number: [id number] Patient Account Number: [id number] Date of Birth/Sex: 15-Oct-1950 (67 y.o. M) Treating RN: Ahmed Prima Primary Care Adriyanna Christians: Lamonte Sakai Other Clinician: Referring Misha Antonini: Lamonte Sakai Treating Jamiah Homeyer/Extender: Cathie Olden in Treatment: 41 Vital Signs Height(in): 21 Pulse(bpm): 79 Weight(lbs): 168 Blood Pressure(mmHg): 162/82 Body Mass Index(BMI): 25 Temperature(F): 98.1 Respiratory Rate 16 (breaths/[name]): Photos: [1:No Photos] [2:No Photos] [5:No Photos] Wound Location: [1:Right Toe Great] [2:Right Toe Second] [5:Left Toe Great] Wounding Event: [1:Gradually Appeared] [2:Gradually Appeared] [5:Gradually Appeared] Primary Etiology: [1:Diabetic Wound/Ulcer of the Lower Extremity] [2:Diabetic Wound/Ulcer of the Lower Extremity] [5:Diabetic Wound/Ulcer of the Lower Extremity] Comorbid History: [  1:Anemia, Lymphedema, Congestive Heart Failure, Hypertension, Peripheral Venous Disease, Type II Diabetes, Neuropathy] [2:Anemia, Lymphedema, Congestive Heart Failure, Hypertension, Peripheral Venous Disease, Type II Diabetes,  Neuropathy] [5:Anemia, Lymphedema, Congestive Heart Failure, Hypertension, Peripheral Venous Disease, Type II Diabetes, Neuropathy] Date Acquired: [1:06/11/2016] [2:06/11/2016] [5:06/11/2016] Weeks of Treatment: [1:41] [2:41] [5:41] Wound Status: [1:Open] [2:Open] [5:Open] Pending Amputation on [1:Yes] [2:Yes] [5:Yes] Presentation: Measurements L x W x D [1:0.6x0.6x0.1] [2:1x2.1x0.1] [5:1.1x1.8x0.1] (cm) Area (cm) : [1:0.283] [2:1.649] [5:1.555] Volume (cm) : [1:0.028] [2:0.165] [5:0.156] % Reduction in Area: [1:99.40%] [2:46.30%] [5:89.10%] % Reduction in Volume: [1:99.40%] [2:46.30%] [5:89.10%] Classification: [1:Grade 2] [2:Grade 2] [5:Grade 2] Exudate Amount: [1:Large] [2:Large] [5:Large] Exudate Type: [1:Serosanguineous] [2:Serosanguineous] [5:Serosanguineous] Exudate Color: [1:red, brown] [2:red, brown] [5:red,  brown] Foul Odor After Cleansing: [1:No] [2:No] [5:Yes] Odor Anticipated Due to [1:N/A] [2:N/A] [5:No] Product Use: Wound Margin: [1:Flat and Intact] [2:Flat and Intact] [5:Flat and Intact] Granulation Amount: [1:Large (67-100%)] [2:Large (67-100%)] [5:Medium (34-66%)] Granulation Quality: [1:Red] [2:Red] [5:Red] Necrotic Amount: [1:Small (1-33%)] [2:Small (1-33%)] [5:Small (1-33%)] Necrotic Tissue: [1:Eschar, Adherent Slough] [2:Adherent Slough] [5:Adherent Slough] Exposed Structures: [1:Fat Layer (Subcutaneous Tissue) Exposed: Yes Fascia: No Tendon: No] [2:Fat Layer (Subcutaneous Tissue) Exposed: Yes Fascia: No Tendon: No] [5:Fat Layer (Subcutaneous Tissue) Exposed: Yes Fascia: No Tendon: No] Muscle: No Muscle: No Muscle: No Joint: No Joint: No Joint: No Bone: No Bone: No Bone: No Epithelialization: Small (1-33%) Medium (34-66%) Small (1-33%) Debridement: Debridement - Excisional Debridement - Excisional Debridement - Excisional Pre-procedure 09:18 09:18 09:18 Verification/Time Out Taken: Pain Control: Lidocaine 4% Topical Solution Lidocaine 4% Topical Solution Lidocaine 4% Topical Solution Tissue Debrided: Subcutaneous, Slough Subcutaneous, Slough Subcutaneous, Slough Level: Skin/Subcutaneous Tissue Skin/Subcutaneous Tissue Skin/Subcutaneous Tissue Debridement Area (sq cm): 0.36 2.1 1.98 Instrument: Curette Curette Curette Bleeding: Minimum Minimum Minimum Hemostasis Achieved: Pressure Pressure Pressure Procedural Pain: 0 0 0 Post Procedural Pain: 0 0 0 Debridement Treatment Procedure was tolerated well Procedure was tolerated well Procedure was tolerated well Response: Post Debridement 0.6x0.6x0.2 1x2.1x0.2 1.1x1.8x0.2 Measurements L x W x D (cm) Post Debridement Volume: 0.057 0.33 0.311 (cm) Periwound Skin Texture: Excoriation: Yes Scarring: Yes Excoriation: No Callus: Yes Excoriation: No Induration: No Induration: No Induration: No Callus: No Crepitus:  No Callus: No Crepitus: No Rash: No Crepitus: No Rash: No Scarring: No Rash: No Scarring: No Periwound Skin Moisture: Maceration: Yes Maceration: Yes Maceration: Yes Dry/Scaly: No Dry/Scaly: No Dry/Scaly: No Periwound Skin Color: Atrophie Blanche: No Atrophie Blanche: No Atrophie Blanche: No Cyanosis: No Cyanosis: No Cyanosis: No Ecchymosis: No Ecchymosis: No Ecchymosis: No Erythema: No Erythema: No Erythema: No Hemosiderin Staining: No Hemosiderin Staining: No Hemosiderin Staining: No Mottled: No Mottled: No Mottled: No Pallor: No Pallor: No Pallor: No Rubor: No Rubor: No Rubor: No Temperature: No Abnormality No Abnormality No Abnormality Tenderness on Palpation: No No No Wound Preparation: Ulcer Cleansing: Ulcer Cleansing: Ulcer Cleansing: Rinsed/Irrigated with Saline Rinsed/Irrigated with Saline Rinsed/Irrigated with Saline Topical Anesthetic Applied: Topical Anesthetic Applied: Topical Anesthetic Applied: Other: lidocaine 4% Other: lidocaine 4% Other: lidocaine 4% Procedures Performed: Debridement Debridement Debridement Wound Number: 6 [id number] N/A Photos: No Photos No Photos N/A Wound Location: Left Toe Second Right Toe Third N/A Wounding Event: Gradually Appeared Gradually Appeared N/A Primary Etiology: Diabetic Wound/Ulcer of the Diabetic Wound/Ulcer of the N/A Lower Extremity Lower Extremity Comorbid History: Anemia, Lymphedema, N/A N/A Congestive Heart Failure, Hypertension, Peripheral Laborde, Bijon E. ([id number]) Venous Disease, Type II Diabetes, Neuropathy Date Acquired: 06/11/2016 12/31/2016 N/A Weeks of Treatment: 41 27 N/A Wound Status: Open Healed - Epithelialized N/A Pending Amputation on Yes  No N/A Presentation: Measurements L x W x D 0.7x0.8x0.1 0x0x0 N/A (cm) Area (cm) : 0.44 0 N/A Volume (cm) : 0.044 0 N/A % Reduction in Area: 79.40% 100.00% N/A % Reduction in Volume: 79.40% 100.00% N/A Classification: Grade 2 Grade 2  N/A Exudate Amount: Large N/A N/A Exudate Type: Serosanguineous N/A N/A Exudate Color: red, brown N/A N/A Foul Odor After Cleansing: No N/A N/A Odor Anticipated Due to N/A N/A N/A Product Use: Wound Margin: Flat and Intact N/A N/A Granulation Amount: Large (67-100%) N/A N/A Granulation Quality: Red, Hyper-granulation N/A N/A Necrotic Amount: Small (1-33%) N/A N/A Necrotic Tissue: Adherent Slough N/A N/A Exposed Structures: Fat Layer (Subcutaneous N/A N/A Tissue) Exposed: Yes Fascia: No Tendon: No Muscle: No Joint: No Bone: No Epithelialization: Small (1-33%) N/A N/A Debridement: Debridement - Excisional N/A N/A Pre-procedure 09:18 N/A N/A Verification/Time Out Taken: Pain Control: Lidocaine 4% Topical Solution N/A N/A Tissue Debrided: Subcutaneous, Slough N/A N/A Level: Skin/Subcutaneous Tissue N/A N/A Debridement Area (sq cm): 0.56 N/A N/A Instrument: Curette N/A N/A Bleeding: Minimum N/A N/A Hemostasis Achieved: Pressure N/A N/A Procedural Pain: 0 N/A N/A Post Procedural Pain: 0 N/A N/A Debridement Treatment Procedure was tolerated well N/A N/A Response: Post Debridement 0.7x0.8x0.2 N/A N/A Measurements L x W x D (cm) Post Debridement Volume: 0.088 N/A N/A (cm) Periwound Skin Texture: Excoriation: No No Abnormalities Noted N/A Induration: No Callus: No Crepitus: No Vizzini, Jarl E. ([id number]) Rash: No Scarring: No Periwound Skin Moisture: Maceration: Yes No Abnormalities Noted N/A Dry/Scaly: No Periwound Skin Color: Atrophie Blanche: No No Abnormalities Noted N/A Cyanosis: No Ecchymosis: No Erythema: No Hemosiderin Staining: No Mottled: No Pallor: No Rubor: No Temperature: No Abnormality N/A N/A Tenderness on Palpation: No No N/A Wound Preparation: Ulcer Cleansing: N/A N/A Rinsed/Irrigated with Saline Topical Anesthetic Applied: Other: lidocaine 4% Procedures Performed: Debridement N/A N/A Treatment Notes Electronic Signature(s) Signed:  07/11/2017 9:35:25 AM By: Lawanda Cousins Entered By: Lawanda Cousins on 07/11/2017 09:35:25 Kirstein, [name] ([id number]) -------------------------------------------------------------------------------- [place] Details Patient Name: Lott, Rendell E. Date of Service: 07/11/2017 9:00 AM Medical Record Number: [id number] Patient Account Number: [id number] Date of Birth/Sex: 28-Aug-1950 (67 y.o. M) Treating RN: Ahmed Prima Primary Care Breyden Jeudy: Lamonte Sakai Other Clinician: Referring Marissia Blackham: Lamonte Sakai Treating Chanteria Haggard/Extender: Cathie Olden in Treatment: 89 Active Inactive ` Abuse / Safety / Falls / Self Care Management Nursing Diagnoses: Potential for falls Goals: Patient will remain injury free related to falls Date Initiated: 09/21/2016 Target Resolution Date: 09/14/2017 Goal Status: Active Interventions: Assess fall risk on admission and as needed Notes: ` Nutrition Nursing Diagnoses: Potential for alteratiion in Nutrition/Potential for imbalanced nutrition Goals: Patient/caregiver agrees to and verbalizes understanding of need to use nutritional supplements and/or vitamins as prescribed Date Initiated: 09/21/2016 Target Resolution Date: 09/14/2017 Goal Status: Active Interventions: Assess patient nutrition upon admission and as needed per policy Notes: ` Orientation to the Wound Care Program Nursing Diagnoses: Knowledge deficit related to the wound healing center program Goals: Patient/caregiver will verbalize understanding of the [facility] Date Initiated: 09/21/2016 Target Resolution Date: 06/15/2017 Goal Status: Active Interventions: DARKInaki, Vantine ([id number]) Provide education on orientation to the wound center Notes: ` Wound/Skin Impairment Nursing Diagnoses: Knowledge deficit related to smoking impact on wound healing Goals: Ulcer/skin breakdown will have a volume reduction of 30% by week 4 Date Initiated:  09/21/2016 Target Resolution Date: 08/17/2017 Goal Status: Active Ulcer/skin breakdown will have a volume reduction of 50% by week 8 Date Initiated: 09/21/2016 Target Resolution Date: 08/17/2017 Goal  Status: Active Ulcer/skin breakdown will have a volume reduction of 80% by week 12 Date Initiated: 09/21/2016 Target Resolution Date: 09/14/2017 Goal Status: Active Ulcer/skin breakdown will heal within 14 weeks Date Initiated: 09/21/2016 Target Resolution Date: 08/17/2017 Goal Status: Active Interventions: Assess patient/caregiver ability to obtain necessary supplies Assess patient/caregiver ability to perform ulcer/skin care regimen upon admission and as needed Assess ulceration(s) every visit Notes: Electronic Signature(s) Signed: 07/11/2017 4:32:30 PM By: Alric Quan Entered By: Alric Quan on 07/11/2017 09:19:05 Hartnett, Cordelle E. ([id number]) -------------------------------------------------------------------------------- Pain Assessment Details Patient Name: Mancias, Eston E. Date of Service: 07/11/2017 9:00 AM Medical Record Number: [id number] Patient Account Number: [id number] Date of Birth/Sex: 14-Aug-1950 (67 y.o. M) Treating RN: Montey Hora Primary Care Calianne Larue: Lamonte Sakai Other Clinician: Referring Sidra Oldfield: Lamonte Sakai Treating Myracle Febres/Extender: Cathie Olden in Treatment: 44 Active Problems Location of Pain Severity and Description of Pain Patient Has Paino No Site Locations Pain Management and Medication Current Pain Management: Electronic Signature(s) Signed: 07/11/2017 4:24:22 PM By: Montey Hora Entered By: Montey Hora on 07/11/2017 09:07:50 Ellerbrock, [name] ([id number]) -------------------------------------------------------------------------------- Patient/Caregiver Education Details Patient Name: Wheeless, Carzell E. Date of Service: 07/11/2017 9:00 AM Medical Record Number: [id number] Patient Account Number: [id number] Date of Birth/Gender: 1950-07-11  (67 y.o. M) Treating RN: Roger Shelter Primary Care Physician: Lamonte Sakai Other Clinician: Referring Physician: Lamonte Sakai Treating Physician/Extender: Cathie Olden in Treatment: 54 Education Assessment Education Provided To: Patient Education Topics Provided Wound/Skin Impairment: Handouts: Caring for Your Ulcer Methods: Explain/Verbal Responses: State content correctly Electronic Signature(s) Signed: 07/11/2017 2:53:14 PM By: Roger Shelter Entered By: Roger Shelter on 07/11/2017 09:52:15 Vancuren, [name] ([id number]) -------------------------------------------------------------------------------- Wound Assessment Details Patient Name: Hargraves, Zebulan E. Date of Service: 07/11/2017 9:00 AM Medical Record Number: [id number] Patient Account Number: [id number] Date of Birth/Sex: [birth year] (67 y.o. M) Treating RN: Montey Hora Primary Care Lynwood Kubisiak: Lamonte Sakai Other Clinician: Referring Jameyah Fennewald: Lamonte Sakai Treating Camora Tremain/Extender: Cathie Olden in Treatment: 41 Wound Status Wound Number: 1 Primary Diabetic Wound/Ulcer of the Lower Extremity Etiology: Wound Location: Right Toe Great Wound Open Wounding Event: Gradually Appeared Status: Date Acquired: 06/11/2016 Comorbid Anemia, Lymphedema, Congestive Heart Weeks Of Treatment: 41 History: Failure, Hypertension, Peripheral Venous Clustered Wound: No Disease, Type II Diabetes, Neuropathy Pending Amputation On Presentation Photos Photo Uploaded By: Montey Hora on 07/11/2017 10:03:57 Wound Measurements Length: (cm) 0.6 Width: (cm) 0.6 Depth: (cm) 0.1 Area: (cm) 0.283 Volume: (cm) 0.028 % Reduction in Area: 99.4% % Reduction in Volume: 99.4% Epithelialization: Small (1-33%) Tunneling: No Undermining: No Wound Description Classification: Grade 2 Foul O Wound Margin: Flat and Intact Slough Exudate Amount: Large Exudate Type: Serosanguineous Exudate Color: red, brown dor After Cleansing:  No /Fibrino No Wound Bed Granulation Amount: Large (67-100%) Exposed Structure Granulation Quality: Red Fascia Exposed: No Necrotic Amount: Small (1-33%) Fat Layer (Subcutaneous Tissue) Exposed: Yes Necrotic Quality: Eschar, Adherent Slough Tendon Exposed: No Muscle Exposed: No Joint Exposed: No Bone Exposed: No Periwound Skin Texture Schmuck, Alakai E. ([id number]) Texture Color No Abnormalities Noted: No No Abnormalities Noted: No Callus: Yes Atrophie Blanche: No Crepitus: No Cyanosis: No Excoriation: Yes Ecchymosis: No Induration: No Erythema: No Rash: No Hemosiderin Staining: No Scarring: No Mottled: No Pallor: No Moisture Rubor: No No Abnormalities Noted: No Dry / Scaly: No Temperature / Pain Maceration: Yes Temperature: No Abnormality Wound Preparation Ulcer Cleansing: Rinsed/Irrigated with Saline Topical Anesthetic Applied: Other: lidocaine 4%, Treatment Notes Wound #1 (Right Toe Great) 1. Cleansed with: Clean wound with Normal Saline 2. Anesthetic Topical Lidocaine 4% cream to wound  bed prior to debridement 4. Dressing Applied: Other dressing (specify in notes) 5. Secondary Dressing Applied ABD Pad Kerlix/Conform Notes silvercell, darko offloading shoes Electronic Signature(s) Signed: 07/11/2017 4:24:22 PM By: Montey Hora Entered By: Montey Hora on 07/11/2017 09:12:51 Nocito, [name] ([id number]) -------------------------------------------------------------------------------- Wound Assessment Details Patient Name: Doukas, Ridgely E. Date of Service: 07/11/2017 9:00 AM Medical Record Number: [id number] Patient Account Number: [id number] Date of Birth/Sex: 1950-12-09 (66 y.o. M) Treating RN: Montey Hora Primary Care Breeanna Galgano: Lamonte Sakai Other Clinician: Referring Quanell Loughney: Lamonte Sakai Treating Zakiah Gauthreaux/Extender: Cathie Olden in Treatment: 41 Wound Status Wound Number: 2 Primary Diabetic Wound/Ulcer of the Lower  Extremity Etiology: Wound Location: Right Toe Second Wound Open Wounding Event: Gradually Appeared Status: Date Acquired: 06/11/2016 Comorbid Anemia, Lymphedema, Congestive Heart Weeks Of Treatment: 41 History: Failure, Hypertension, Peripheral Venous Clustered Wound: No Disease, Type II Diabetes, Neuropathy Pending Amputation On Presentation Photos Photo Uploaded By: Montey Hora on 07/11/2017 10:04:15 Wound Measurements Length: (cm) 1 Width: (cm) 2.1 Depth: (cm) 0.1 Area: (cm) 1.649 Volume: (cm) 0.165 % Reduction in Area: 46.3% % Reduction in Volume: 46.3% Epithelialization: Medium (34-66%) Tunneling: No Undermining: No Wound Description Classification: Grade 2 Foul O Wound Margin: Flat and Intact Slough Exudate Amount: Large Exudate Type: Serosanguineous Exudate Color: red, brown dor After Cleansing: No /Fibrino No Wound Bed Granulation Amount: Large (67-100%) Exposed Structure Granulation Quality: Red Fascia Exposed: No Necrotic Amount: Small (1-33%) Fat Layer (Subcutaneous Tissue) Exposed: Yes Necrotic Quality: Adherent Slough Tendon Exposed: No Muscle Exposed: No Joint Exposed: No Bone Exposed: No Periwound Skin Texture Mcgovern, Josearmando E. ([id number]) Texture Color No Abnormalities Noted: No No Abnormalities Noted: No Callus: No Atrophie Blanche: No Crepitus: No Cyanosis: No Excoriation: No Ecchymosis: No Induration: No Erythema: No Rash: No Hemosiderin Staining: No Scarring: Yes Mottled: No Pallor: No Moisture Rubor: No No Abnormalities Noted: No Dry / Scaly: No Temperature / Pain Maceration: Yes Temperature: No Abnormality Wound Preparation Ulcer Cleansing: Rinsed/Irrigated with Saline Topical Anesthetic Applied: Other: lidocaine 4%, Treatment Notes Wound #2 (Right Toe Second) 1. Cleansed with: Clean wound with Normal Saline 2. Anesthetic Topical Lidocaine 4% cream to wound bed prior to debridement 4. Dressing Applied: Other  dressing (specify in notes) 5. Secondary Dressing Applied ABD Pad Kerlix/Conform Notes silvercell, darko offloading shoes Electronic Signature(s) Signed: 07/11/2017 4:24:22 PM By: Montey Hora Entered By: Montey Hora on 07/11/2017 09:13:26 Whitson, [name] ([id number]) -------------------------------------------------------------------------------- Wound Assessment Details Patient Name: Pang, Nevin E. Date of Service: 07/11/2017 9:00 AM Medical Record Number: [id number] Patient Account Number: [id number] Date of Birth/Sex: 03-02-1951 (67 y.o. M) Treating RN: Montey Hora Primary Care Maisen Klingler: Lamonte Sakai Other Clinician: Referring Carlea Badour: Lamonte Sakai Treating Kameryn Tisdel/Extender: Cathie Olden in Treatment: 41 Wound Status Wound Number: 5 Primary Diabetic Wound/Ulcer of the Lower Extremity Etiology: Wound Location: Left Toe Great Wound Open Wounding Event: Gradually Appeared Status: Date Acquired: 06/11/2016 Comorbid Anemia, Lymphedema, Congestive Heart Weeks Of Treatment: 41 History: Failure, Hypertension, Peripheral Venous Clustered Wound: No Disease, Type II Diabetes, Neuropathy Pending Amputation On Presentation Photos Photo Uploaded By: Montey Hora on 07/11/2017 10:04:16 Wound Measurements Length: (cm) 1.1 Width: (cm) 1.8 Depth: (cm) 0.1 Area: (cm) 1.555 Volume: (cm) 0.156 % Reduction in Area: 89.1% % Reduction in Volume: 89.1% Epithelialization: Small (1-33%) Tunneling: No Undermining: No Wound Description Classification: Grade 2 Wound Margin: Flat and Intact Exudate Amount: Large Exudate Type: Serosanguineous Exudate Color: red, brown Foul Odor After Cleansing: Yes Due to Product Use: No Slough/Fibrino No Wound Bed Granulation Amount: Medium (34-66%) Exposed Structure Granulation  Quality: Red Fascia Exposed: No Necrotic Amount: Small (1-33%) Fat Layer (Subcutaneous Tissue) Exposed: Yes Necrotic Quality: Adherent Slough Tendon  Exposed: No Muscle Exposed: No Joint Exposed: No Bone Exposed: No Periwound Skin Texture Birnie, Levent E. ([id number]) Texture Color No Abnormalities Noted: No No Abnormalities Noted: No Callus: No Atrophie Blanche: No Crepitus: No Cyanosis: No Excoriation: No Ecchymosis: No Induration: No Erythema: No Rash: No Hemosiderin Staining: No Scarring: No Mottled: No Pallor: No Moisture Rubor: No No Abnormalities Noted: No Dry / Scaly: No Temperature / Pain Maceration: Yes Temperature: No Abnormality Wound Preparation Ulcer Cleansing: Rinsed/Irrigated with Saline Topical Anesthetic Applied: Other: lidocaine 4%, Treatment Notes Wound #5 (Left Toe Great) 1. Cleansed with: Clean wound with Normal Saline 2. Anesthetic Topical Lidocaine 4% cream to wound bed prior to debridement 4. Dressing Applied: Other dressing (specify in notes) 5. Secondary Dressing Applied ABD Pad Kerlix/Conform Notes silvercell, darko offloading shoes Electronic Signature(s) Signed: 07/11/2017 4:24:22 PM By: Montey Hora Entered By: Montey Hora on 07/11/2017 09:14:08 Dieterich, Corie E. ([id number]) -------------------------------------------------------------------------------- Wound Assessment Details Patient Name: Hazell, Sasuke E. Date of Service: 07/11/2017 9:00 AM Medical Record Number: [id number] Patient Account Number: [id number] Date of Birth/Sex: 1951-03-28 (67 y.o. M) Treating RN: Montey Hora Primary Care Azad Calame: Lamonte Sakai Other Clinician: Referring Ronae [name]: Lamonte Sakai Treating [place] Pelto/Extender: Cathie Olden in Treatment: 41 Wound Status Wound Number: 6 Primary Diabetic Wound/Ulcer of the Lower Extremity Etiology: Wound Location: Left Toe Second Wound Open Wounding Event: Gradually Appeared Status: Date Acquired: 06/11/2016 Comorbid Anemia, Lymphedema, Congestive Heart Weeks Of Treatment: 41 History: Failure, Hypertension, Peripheral Venous Clustered Wound:  No Disease, Type II Diabetes, Neuropathy Pending Amputation On Presentation Photos Photo Uploaded By: Montey Hora on 07/11/2017 10:04:34 Wound Measurements Length: (cm) 0.7 Width: (cm) 0.8 Depth: (cm) 0.1 Area: (cm) 0.44 Volume: (cm) 0.044 % Reduction in Area: 79.4% % Reduction in Volume: 79.4% Epithelialization: Small (1-33%) Tunneling: No Undermining: No Wound Description Classification: Grade 2 Foul O Wound Margin: Flat and Intact Slough Exudate Amount: Large Exudate Type: Serosanguineous Exudate Color: red, brown dor After Cleansing: No /Fibrino No Wound Bed Granulation Amount: Large (67-100%) Exposed Structure Granulation Quality: Red, Hyper-granulation Fascia Exposed: No Necrotic Amount: Small (1-33%) Fat Layer (Subcutaneous Tissue) Exposed: Yes Necrotic Quality: Adherent Slough Tendon Exposed: No Muscle Exposed: No Joint Exposed: No Bone Exposed: No Periwound Skin Texture Villacres, Christphor E. ([id number]) Texture Color No Abnormalities Noted: No No Abnormalities Noted: No Callus: No Atrophie Blanche: No Crepitus: No Cyanosis: No Excoriation: No Ecchymosis: No Induration: No Erythema: No Rash: No Hemosiderin Staining: No Scarring: No Mottled: No Pallor: No Moisture Rubor: No No Abnormalities Noted: No Dry / Scaly: No Temperature / Pain Maceration: Yes Temperature: No Abnormality Wound Preparation Ulcer Cleansing: Rinsed/Irrigated with Saline Topical Anesthetic Applied: Other: lidocaine 4%, Treatment Notes Wound #6 (Left Toe Second) 1. Cleansed with: Clean wound with Normal Saline 2. Anesthetic Topical Lidocaine 4% cream to wound bed prior to debridement 4. Dressing Applied: Other dressing (specify in notes) 5. Secondary Dressing Applied ABD Pad Kerlix/Conform Notes silvercell, darko offloading shoes Electronic Signature(s) Signed: 07/11/2017 4:24:22 PM By: Montey Hora Entered By: Montey Hora on 07/11/2017 09:14:46 Mantz, [name] ([id number]) -------------------------------------------------------------------------------- Wound Assessment Details Patient Name: Nazir, Sandor E. Date of Service: 07/11/2017 9:00 AM Medical Record Number: [id number] Patient Account Number: [id number] Date of Birth/Sex: 09-16-1950 (67 y.o. M) Treating RN: Montey Hora Primary Care Keniah Klemmer: Lamonte Sakai Other Clinician: Referring Adin Lariccia: Lamonte Sakai Treating Macio Kissoon/Extender: Cathie Olden in Treatment: 41 Wound Status Wound Number: 5  Primary Diabetic Wound/Ulcer of the Lower Etiology: Extremity Wound Location: Right Toe Third Wound Status: Healed - Epithelialized Wounding Event: Gradually Appeared Date Acquired: 12/31/2016 Weeks Of Treatment: 27 Clustered Wound: No Photos Photo Uploaded By: Montey Hora on 07/11/2017 10:04:35 Wound Measurements Length: (cm) 0 % Width: (cm) 0 % Depth: (cm) 0 Area: (cm) 0 Volume: (cm) 0 Reduction in Area: 100% Reduction in Volume: 100% Wound Description Classification: Grade 2 Periwound Skin Texture Texture Color No Abnormalities Noted: No No Abnormalities Noted: No Moisture No Abnormalities Noted: No Electronic Signature(s) Signed: 07/11/2017 4:24:22 PM By: Montey Hora Entered By: Montey Hora on 07/11/2017 09:15:20 Mckinny, [name] ([id number]) -------------------------------------------------------------------------------- Vitals Details Patient Name: Neu, Shaheen E. Date of Service: 07/11/2017 9:00 AM Medical Record Number: [id number] Patient Account Number: [id number] Date of Birth/Sex: 1951/02/23 (67 y.o. M) Treating RN: Montey Hora Primary Care Tanielle Emigh: Lamonte Sakai Other Clinician: Referring Roxsana Riding: Lamonte Sakai Treating Shereena Berquist/Extender: Cathie Olden in Treatment: 54 Vital Signs Time Taken: 09:10 Temperature (F): 98.1 Height (in): 69 Pulse (bpm): 74 Weight (lbs): 168 Respiratory Rate (breaths/[name]): 16 Body Mass Index (BMI): 24.8 Blood  Pressure (mmHg): 162/82 Reference Range: 80 - 120 mg / dl Electronic Signature(s) Signed: 07/11/2017 4:24:22 PM By: Montey Hora Entered By: Montey Hora on 07/11/2017 09:10:19

## 2017-07-12 NOTE — Progress Notes (Signed)
[name], [name] ([id number]) Visit Report for 07/11/2017 Chief Complaint Document Details Patient Name: [name], [name] Date of Service: 07/11/2017 9:00 AM Medical Record Number: [id number] Patient Account Number: [id number] Date of Birth/Sex: 07-30-1950 (67 y.o. M) Treating RN: Ahmed Prima Primary Care Provider: Lamonte Sakai Other Clinician: Referring Provider: Lamonte Sakai Treating Provider/Extender: Cathie Olden in Treatment: 84 Information Obtained from: Patient Chief Complaint Patient presents for treatment of an open diabetic ulcer to both feet Electronic Signature(s) Signed: 07/11/2017 9:37:39 AM By: Lawanda Cousins Entered By: Lawanda Cousins on 07/11/2017 09:37:39 Garland, [name] ([id number]) -------------------------------------------------------------------------------- Debridement Details Patient Name: [name], [name] Date of Service: 07/11/2017 9:00 AM Medical Record Number: [id number] Patient Account Number: [id number] Date of Birth/Sex: Apr 28, 1950 (67 y.o. M) Treating RN: Ahmed Prima Primary Care Provider: Lamonte Sakai Other Clinician: Referring Provider: Lamonte Sakai Treating Provider/Extender: Cathie Olden in Treatment: 41 Debridement Performed for Wound #1 Right Toe Great Assessment: Performed By: Physician Lawanda Cousins, NP Debridement Type: Debridement Severity of Tissue Pre Limited to breakdown of skin Debridement: Pre-procedure Verification/Time Yes - 09:18 Out Taken: Start Time: 09:24 Pain Control: Lidocaine 4% Topical Solution Total Area Debrided (L x W): 0.6 (cm) x 0.6 (cm) = 0.36 (cm) Tissue and other material Viable, Non-Viable, Callus, Slough, Subcutaneous, Fibrin/Exudate, Slough debrided: Level: Skin/Subcutaneous Tissue Debridement Description: Excisional Instrument: Curette Bleeding: Minimum Hemostasis Achieved: Pressure End Time: 09:25 Procedural Pain: 0 Post Procedural Pain: 0 Response to Treatment: Procedure was tolerated  well Post Debridement Measurements of Total Wound Length: (cm) 0.6 Width: (cm) 0.6 Depth: (cm) 0.2 Volume: (cm) 0.057 Character of Wound/Ulcer Post Debridement: Requires Further Debridement Severity of Tissue Post Debridement: Fat layer exposed Post Procedure Diagnosis Same as Pre-procedure Electronic Signature(s) Signed: 07/11/2017 9:36:10 AM By: Lawanda Cousins Signed: 07/11/2017 4:32:30 PM By: Alric Quan Entered By: Lawanda Cousins on 07/11/2017 09:36:10 Oconnell, [name] ([id number]) -------------------------------------------------------------------------------- Debridement Details Patient Name: [name], [name] Date of Service: 07/11/2017 9:00 AM Medical Record Number: [id number] Patient Account Number: [id number] Date of Birth/Sex: 03/18/1951 (67 y.o. M) Treating RN: Ahmed Prima Primary Care Provider: Lamonte Sakai Other Clinician: Referring Provider: Lamonte Sakai Treating Provider/Extender: Cathie Olden in Treatment: 41 Debridement Performed for Wound #2 Right Toe Second Assessment: Performed By: Physician Lawanda Cousins, NP Debridement Type: Debridement Severity of Tissue Pre Fat layer exposed Debridement: Pre-procedure Verification/Time Yes - 09:18 Out Taken: Start Time: 09:23 Pain Control: Lidocaine 4% Topical Solution Total Area Debrided (L x W): 1 (cm) x 2.1 (cm) = 2.1 (cm) Tissue and other material Viable, Non-Viable, Slough, Subcutaneous, Fibrin/Exudate, Slough debrided: Level: Skin/Subcutaneous Tissue Debridement Description: Excisional Instrument: Curette Bleeding: Minimum Hemostasis Achieved: Pressure End Time: 09:24 Procedural Pain: 0 Post Procedural Pain: 0 Response to Treatment: Procedure was tolerated well Post Debridement Measurements of Total Wound Length: (cm) 1 Width: (cm) 2.1 Depth: (cm) 0.2 Volume: (cm) 0.33 Character of Wound/Ulcer Post Debridement: Requires Further Debridement Severity of Tissue Post Debridement: Fat layer  exposed Post Procedure Diagnosis Same as Pre-procedure Electronic Signature(s) Signed: 07/11/2017 9:36:29 AM By: Lawanda Cousins Signed: 07/11/2017 4:32:30 PM By: Alric Quan Entered By: Lawanda Cousins on 07/11/2017 09:36:28 Kartes, [name] ([id number]) -------------------------------------------------------------------------------- Debridement Details Patient Name: [name], [name] Date of Service: 07/11/2017 9:00 AM Medical Record Number: [id number] Patient Account Number: [id number] Date of Birth/Sex: 10/20/1950 (67 y.o. M) Treating RN: Ahmed Prima Primary Care Provider: Lamonte Sakai Other Clinician: Referring Provider: Lamonte Sakai Treating Provider/Extender: Cathie Olden in Treatment: 41 Debridement Performed for Wound #5 Left Toe Great Assessment: Performed By: Physician  Jniyah Dantuono, Denny Peon, NP Debridement Type: Debridement Severity of Tissue Pre Limited to breakdown of skin Debridement: Pre-procedure Verification/Time Yes - 09:18 Out Taken: Start Time: 09:21 Pain Control: Lidocaine 4% Topical Solution Total Area Debrided (L x W): 1.1 (cm) x 1.8 (cm) = 1.98 (cm) Tissue and other material Viable, Skin: Dermis , Skin: Epidermis debrided: Level: Skin/Epidermis Debridement Description: Selective/Open Wound Instrument: Curette Bleeding: Minimum Hemostasis Achieved: Pressure End Time: 09:22 Procedural Pain: 0 Post Procedural Pain: 0 Response to Treatment: Procedure was tolerated well Post Debridement Measurements of Total Wound Length: (cm) 1.1 Width: (cm) 1.8 Depth: (cm) 0.2 Volume: (cm) 0.311 Character of Wound/Ulcer Post Debridement: Requires Further Debridement Severity of Tissue Post Debridement: Fat layer exposed Post Procedure Diagnosis Same as Pre-procedure Electronic Signature(s) Signed: 07/11/2017 9:37:11 AM By: Lawanda Cousins Signed: 07/11/2017 4:32:30 PM By: Alric Quan Entered By: Lawanda Cousins on 07/11/2017 09:37:11 Gatz, [name]  ([id number]) -------------------------------------------------------------------------------- Debridement Details Patient Name: [name], [name] Date of Service: 07/11/2017 9:00 AM Medical Record Number: [id number] Patient Account Number: [id number] Date of Birth/Sex: 1951/01/15 (67 y.o. M) Treating RN: Ahmed Prima Primary Care Provider: Lamonte Sakai Other Clinician: Referring Provider: Lamonte Sakai Treating Provider/Extender: Cathie Olden in Treatment: 41 Debridement Performed for Wound #6 Left Toe Second Assessment: Performed By: Physician Lawanda Cousins, NP Debridement Type: Debridement Severity of Tissue Pre Fat layer exposed Debridement: Pre-procedure Verification/Time Yes - 09:18 Out Taken: Start Time: 09:19 Pain Control: Lidocaine 4% Topical Solution Total Area Debrided (L x W): 0.7 (cm) x 0.8 (cm) = 0.56 (cm) Tissue and other material Viable, Non-Viable, Slough, Subcutaneous, Fibrin/Exudate, Slough, Hyper-granulation debrided: Level: Skin/Subcutaneous Tissue Debridement Description: Excisional Instrument: Curette Bleeding: Minimum Hemostasis Achieved: Pressure End Time: 09:21 Procedural Pain: 0 Post Procedural Pain: 0 Response to Treatment: Procedure was tolerated well Post Debridement Measurements of Total Wound Length: (cm) 0.7 Width: (cm) 0.8 Depth: (cm) 0.2 Volume: (cm) 0.088 Character of Wound/Ulcer Post Debridement: Requires Further Debridement Severity of Tissue Post Debridement: Fat layer exposed Post Procedure Diagnosis Same as Pre-procedure Electronic Signature(s) Signed: 07/11/2017 9:37:29 AM By: Lawanda Cousins Signed: 07/11/2017 4:32:30 PM By: Alric Quan Entered By: Lawanda Cousins on 07/11/2017 09:37:28 Jean, [name] ([id number]) -------------------------------------------------------------------------------- HPI Details Patient Name: [name], [name] Date of Service: 07/11/2017 9:00 AM Medical Record Number: [id number] Patient Account  Number: [id number] Date of Birth/Sex: December 10, 1950 (67 y.o. M) Treating RN: Ahmed Prima Primary Care Provider: Lamonte Sakai Other Clinician: Referring Provider: Lamonte Sakai Treating Provider/Extender: Cathie Olden in Treatment: 65 History of Present Illness Location: bilateral feet ulceration on the toes Quality: Patient reports experiencing a dull pain to affected area(s). Severity: Patient states wound are getting better Duration: Patient has had the wound for > 3 months prior to seeking treatment at the wound center Timing: Pain in wound is constant (hurts all the time) Context: The wound would happen gradually Modifying Factors: Other treatment(s) tried include:treatment for lymphedema and is seen by the podiatrist Dr. Caryl Comes Associated Signs and Symptoms: Patient reports having increase swelling. HPI Description: 68 year old patient here to see as for bilateral feet ulceration to on his left first and second toe and 2 on his right first and second toe, which she's had for about 4 months. 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 Donella Stade. The patient is also undergoing workup by medical oncology for a intra-abdominal lymphadenopathy. 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 He is also status post appendectomy, inguinal lymph node biopsy, rectal biopsy and rectal examination under anesthesia. he currently smokes cigarettes about half packet a day. In March of this year he was seen by Dr. Hortencia Pilar, for evaluation of bilateral varicose veins and besides wearing compression stockings he had recommended laser ablation of the right and left great saphenous veins to eleviate the symptoms and complications of severe superficial  venous reflux disease. He also recommended lymphedema pumps for better control of his lymphedema. The patient recently has had on 08/23/2016, right greater saphenous vein ablation with the laser energy Earlier lower extremity venous reflux examination done on 05/08/2016 showed no DVT or SVT both lower legs but incompetence of bilateral great saphenous veins was present. A lower arterial study was also done and there was no significant right lower and left lower extremity problems based on a normal toe brachial index bilaterally and the ABI was 1.21 the left and 1.23 on the right. His post ablation venous duplex examination showed successful ablation of the right GS vein with thrombus formation 2 below the right saphenofemoral junction. The deep system was patent without evidence of thrombosis and this was done on 08/30/2016. 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 Addendum: regarding his x-rays done today and x-ray of the left foot -- IMPRESSION: No objective evidence of osteomyelitis. There are soft tissue changes which may reflect cellulitis. X-ray of the right foot -- IMPRESSION:Findings compatible with cellulitis of the toes. No objective evidence of osteomyelitis is observed. 10/01/16 on evaluation today patient's wounds appeared to be doing some better. I did review the x-rays as well which showed no evidence of osteomyelitis although there was evidence on x-ray of cellulitis. He fortunately is not having any discomfort although he continues to have some swelling. He does not remember being on any antibiotics recently. 10/15/16 on evaluation today patient's wounds overall appear to be doing better although he does have a new location noted on the left foot. Fortunately he is not having significant pain. It almost has the appearance that something is rubbing on the end of his toes  but he wears the open toe shoes and according to what he is telling me never wears anything that would rub on his foot. There is no evidence of infection and specifically no evidence of a fungal infection 10/22/16 On evaluation today patient's wounds appeared to be doing better compared to last week in regard to his bilateral Triska, Emmanuelle E. ([id number]) lower extremities. Fortunately I happy with how things are progressing although he still has ulcers I feel like that he is improving and appropriate manner. 11/12/16 on evaluation today patient appears to be doing well in regard to his bilateral feet and the respective wounds. We have been using surrounding her dressings along with an antifungal cream which seems to be doing very well. He has no bilateral dysfunction noticed that the rituals are weight loss at this point. He also has no nausea or vomiting a note purulent discharge. He did see Vein and vascular today and he tells me that they told him he could have surgery for his venous stasis but they did not feel like it was worth it in his words. Fortunately patient's wounds do appear to be getting sneakily better. 11/26/2016 -- he  says he is going to have some surgery during this week at [facility] for possibly a colon resection. 12/31/2016 -- the patient has been noncompliant with his smoking and I'm not sure whether he is also started drinking again. He continues to be very nonchalant about his care 01/14/2017 -- the patient's HandP has been reviewed well and I understand he is being compliant with trying to give up smoking and his local dressing changes. He does not have any surgical options of 4 to him by his vascular surgeons.he was last seen in early August by Dr. Hortencia Pilar who recommended compression stockings,and possibly lymph pumps in 2-3 months after doing a review ultrasound. 01/28/2017 - the patient did not have any fresh complaints but on examination I noted a large lacerated  wound on the plantar aspect of his right fourth toe which had a lot of necrotic debris and it probes down to bone. 02/07/2017 -- x-ray of the right foot -- IMPRESSION: Soft tissue swelling about the first through fourth toes consistent with cellulitis. New destructive change in the tuft of the distal phalanx of the great toe is consistent with osteomyelitis. 02/14/2017 -- the patient's MRI is pending this coming Monday and he still continues to smoke. We have again gone over off loading of his wounds in great detail and he says he's been compliant. 02/21/2017 -- MR of the right foot -- IMPRESSION: 1. Soft tissue ulcer at the tip of the first, second and third toe knows. Cortical irregularity and bone marrow edema in the first distal phalanx most concerning for osteomyelitis. Mild marrow edema in the second and third distal phalanx without definite cortical destruction which may reflect early osteomyelitis versus reactive marrow edema. 2. Soft tissue edema surrounding the first phalanx most consistent with cellulitis. the patient was also recently evaluated by his medical oncologist Dr. 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 She is treating him with weekly Procrit. She is also keeping intra-abdominal lymphadenopathy and right lower lobe lung nodule under observation. 04/04/2017 -- he was seen by Dr. Adrian Prows on 03/25/2017 -- after review he empirically put him on ciprofloxacin and doxycycline as they have good bone penetration and good bioavailability and it will cover the usual pathogens and diabetic foot osteomyelitis. He will check inflammatory markers and plan a 36-27 week old records. C-reactive protein was 0.3 and the ESR was 72 04/18/17 on evaluation today patient appears to be doing about the same in regard to his lower extremity wounds bilaterally. He has continued to use the antifungal cream which does seem to  be beneficial. Nonetheless the ulcers do seem to in some areas be epithelial eyes over and in other areas are still open. He is having no significant discomfort. 04/25/17-he is here in follow-up evaluation for multiple ulcerations to multiple toes bilaterally. He states he did see Dr. Ola Spurr again last week and continues antibiotic therapy. He is voicing no complaints or concerns, will continue with current treatment plan will possibility of adding compression therapy next week after an additional week of treatment/lotions to BLE prescribed by Dr Ola Spurr 05/02/17 he is here in follow up for for multiple ulcers to multiple toes bilaterally. we will stop using antifungal cream and will continue with silvercel and follow up next week 05/09/17-he is here in follow-up for multiple ulcerations to multiple toes bilaterally. There is improvement in appearance. He has not completely stopped using antifungal cream, but admits he  has not using it between the toes. He has an appointment with Dr. Ola Spurr on 2/11, continues on doxycycline and Cipro. It has been 5 weeks of antibiotic therapy, we will order plain film xray to evaluate for osteomyelitis next week, prior to follow up with ID. Will continue with silvercel and follow up next week 05/16/17-he is here in follow-up evaluation for multiple ulcerations to multiple toes bilaterally and new wound to the right posterior heel. There is essentially no change in appearance, deteriorating measurements; he has a history of waxing and waning measurements. He admits that he continues to apply moisturizer/cream/ointment to his toes despite weekly reminders to only apply silvercel to his toes. He states that he thinks the surgical shoe contributed to the superficial ulcer to his posterior Vanbuskirk, Jerimyah E. ([id number]) heel, he is unable to articulate if this was an area of dry cracked skin as he has a similar area to the left heel. He now is wearing open toed  slippers. He has an appointment with Dr. Ola Spurr on 2/11. We have ordered x-rays for her bilateral feet; he was advised to obtain the x-rays today or tomorrow. He will follow-up next week 05/23/17-he is here in follow-up evaluation for multiple ulcerations to multiple toes bilaterally and the right posterior heel. There is improvement in maceration. He has been compliant and not applying any moisturizing agent to his toes. He has been using Lac-Hydrin for his lower extremities with improvement. He did not go to his appointment on Monday with Dr. Ola Spurr secondary to financial concerns. X-rays for her bilateral feet showed: LEFT FOOT with slight erosion of the tuft of the distal phalanges of the left first and second toe suspicious for osteomyelitis, RIGHT FOOT with 1.erosion of the tufts of the distal phalanges of the right first second and possibly third toes consistent with osteomyelitis, 2 no definitive abnormality of the calcaneus is seen on the images obtained, 3. Plantar calcaneal degenerative spur. We briefly discussed hyperbaric adjunctive therapy for treatment of chronic refractory osteomyelitis. I do not find an a1c in EMR, will contact PCP for record, or order if needed. He has been encouraged to contact Dr Ola Spurr office regarding the follow-up appointment, encouraged him to inquire about payment plan. We will continue with same treatment plan and follow-up next week. He states he is still taking antibiotics and has "a lot" left. He states he has been taking them as directed, 2 pills twice daily. According to Dr. Blane Ohara office notes he was originally started on 12/17 for 4 weeks and extended on 1/14 for an additional 4 weeks. He should be done with his antibiotic therapy, he was advised to bring his bottles and to his next appointment, we will contact pharmacy. 05/30/17-he is here in follow-up evaluation for multiple ulcerations to multiple toes bilaterally and the right  posterior heel. He is accompanied by his brother-in-law. Wounds are stable. He has yet to make up with Dr. Ola Spurr. We contacted his PCP, with no record of recent A1c we will draw an A1c. His brother-in-law states that he was taken off all of his diabetic medication secondary to kidney function. 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 The antibiotics were counted and have approximately 2 weeks left, although they should be complete. The pill organizer reveals missing days. We discussed the need for consistent medications, to have optimal benefit of medication. He has a cousin that lives with him and he  will ask her to check his organizer daily. He has been advised to follow up with Dr Ola Spurr, and will go by the office today. He has been advised to quit smoking. 06/06/17-he is here in follow up evaluation. He has had to make an appointment with Dr. Ola Spurr. He did have blood work obtained, a1c 5. He continues to take antibiotic therapy. Significant improvement in bilateral lower extremity edema with compression therapy. Essentially no change in ulcerations to toes. He states he is "going to try something different" and "let me know next week" if it works; he would not provide any additional information and was encouraged to follow our orders. We will follow up next week 06/13/17-he is here in follow-up evaluation. He has an appointment with Dr. Ola Spurr tomorrow morning. He states he purchased an ointment from [facility] and applied to his toes for 3 days, he does not remember the name of the ointment. There is improvement to his wounds, minimal maceration. He continues to take antibiotic therapy, this should have been completed last month. His brother-in-law who regularly accompanies his appointments was asked to take the bottles to the appointment tomorrow with Dr. Ola Spurr so he is aware.  We will continue with 3 layer compression, and order OPEN TOE compression 20-30mmHg; we will apply compression stockings next week. He continues to smoke, smoked "2 cigarettes" last week 06/20/17 on evaluation today patient did receive his compression stockings which he has with him today for both lower extremities. With that being said he tells me at this point in time that he is very happy to have these he really is not a big fan of the compression wraps that we have been utilizing although they have been of great benefit for him. Nonetheless at this point he does want to switch to the compression stockings. In my opinion as long as he is continuing with compression I'm okay with the stockings or the wraps. 06/27/17-he is here in follow-up evaluation for multiple ulcerations to his bilateral toes. There is some improvement in appearance. He is compliant in wearing his compression stockings with significant improvement in lower extremity edema. He saw Dr Ola Spurr on 3/8, per his notes they would redraw ESR and CRP; plan to continue antibiotic therapy if these remain elevated. I do not see an ESR or CRP level in Epic. The patient continues to take antibiotics. 07/11/17-He is here in follow-up violation for multiple ulcerations to multiple toes bilaterally. He presents with complete epithelialization to the right third toe; there has been no deterioration. He continues on antibiotic therapy. He will follow-up next week Electronic Signature(s) Signed: 07/11/2017 9:38:33 AM By: Lawanda Cousins Entered By: Lawanda Cousins on 07/11/2017 09:38:32 Pooler, [name] ([id number]) -------------------------------------------------------------------------------- Physician Orders Details Patient Name: [name], [name] Date of Service: 07/11/2017 9:00 AM Medical Record Number: [id number] Patient Account Number: [id number] Date of Birth/Sex: 06/08/1950 (67 y.o. M) Treating RN: Ahmed Prima Primary Care Provider: Lamonte Sakai Other Clinician: Referring Provider: Lamonte Sakai Treating Provider/Extender: Cathie Olden in Treatment: 69 Verbal / Phone Orders: Yes Clinician: Carolyne Fiscal, Debi Read Back and Verified: Yes Diagnosis Coding Wound Cleansing Wound #1 Right Toe Great o Clean wound with Normal Saline. o May Shower, gently pat wound dry prior to applying new dressing. Wound #2 Right Toe Second o Clean wound with Normal Saline. o May Shower, gently pat wound dry prior to applying new dressing. Wound #5 Left Toe Great o Clean wound with Normal Saline. o May Shower, gently pat wound dry prior to  applying new dressing. Wound #6 Left Toe Second o Clean wound with Normal Saline. o May Shower, gently pat wound dry prior to applying new dressing. Anesthetic (add to Medication List) Wound #1 Right Toe Great o Topical Lidocaine 4% cream applied to wound bed prior to debridement (In Clinic Only). Wound #2 Right Toe Second o Topical Lidocaine 4% cream applied to wound bed prior to debridement (In Clinic Only). Wound #5 Left Toe Great o Topical Lidocaine 4% cream applied to wound bed prior to debridement (In Clinic Only). Wound #6 Left Toe Second o Topical Lidocaine 4% cream applied to wound bed prior to debridement (In Clinic Only). Primary Wound Dressing Wound #1 Right Toe Great o Silver Alginate - silvercel Wound #2 Right Toe Second o Silver Alginate - silvercel Wound #5 Left Toe Great o Silver Alginate - silvercel Wound #6 Left Toe Second o Silver Alginate - silvercel Secondary Dressing Keiper, Mihran E. ([id number]) Wound #1 Right Toe Great o ABD and Kerlix/Conform Wound #2 Right Toe Second o ABD and Kerlix/Conform Wound #5 Left Toe Great o ABD and Kerlix/Conform Wound #6 Left Toe Second o ABD and Kerlix/Conform Dressing Change Frequency Wound #1 Right Toe Great o Change dressing every day. Wound #2 Right Toe Second o Change dressing every  day. Wound #5 Left Toe Great o Change dressing every day. Wound #6 Left Toe Second o Change dressing every day. Follow-up Appointments Wound #1 Right Toe Great o Return Appointment in 2 weeks. Wound #2 Right Toe Second o Return Appointment in 2 weeks. Wound #5 Left Toe Great o Return Appointment in 2 weeks. Wound #6 Left Toe Second o Return Appointment in 2 weeks. Edema Control o Patient to wear own compression stockings o Elevate legs to the level of the heart and pump ankles as often as possible Additional Orders / Instructions Wound #1 Right Toe Great o Stop Smoking o Increase protein intake. o Other: - Please add vitamin A, vitamin C and zinc supplements to your diet Wound #2 Right Toe Second o Stop Smoking o Increase protein intake. o Other: - Please add vitamin A, vitamin C and zinc supplements to your diet Wound #5 Left Toe Great o Stop Smoking o Increase protein intake. MIKELL, KAZLAUSKAS ([id number]) o Other: - Please add vitamin A, vitamin C and zinc supplements to your diet Wound #6 Left Toe Second o Stop Smoking o Increase protein intake. o Other: - Please add vitamin A, vitamin C and zinc supplements to your diet Patient Medications Allergies: No Known Drug Allergies Notifications Medication Indication Start End lidocaine DOSE 1 - topical 4 % cream - 1 cream topical Electronic Signature(s) Signed: 07/11/2017 4:32:30 PM By: Alric Quan Signed: 07/11/2017 4:47:35 PM By: Lawanda Cousins Entered By: Alric Quan on 07/11/2017 09:26:34 Peavler, Farris Johnette Abraham ([id number]) -------------------------------------------------------------------------------- Prescription 07/11/2017 Patient Name: [name] Provider: Lawanda Cousins NP Date of Birth: 10-03-1950 NPI#: [id number] Sex: Jerilynn Mages DEA#: [id number] Phone #: [phone] License #: Patient Address: [facility] [place], [place] [address] [address] [place], [place] [address] [phone] Allergies No Known Drug Allergies Medication Medication: Route: Strength: Form: lidocaine topical 4% cream Class: TOPICAL LOCAL ANESTHETICS Dose: Frequency / Time: Indication: 1 1 cream topical Number of Refills: Number of Units: 0 Generic Substitution: Start Date: End Date: Administered at [place]: Yes Time Administered: Time Discontinued: Note to Pharmacy: Signature(s): Date(s): Electronic Signature(s) Signed: 07/11/2017 4:32:30 PM By: Alric Quan Signed: 07/11/2017  4:47:35 PM By: Lawanda Cousins Entered By: Alric Quan on 07/11/2017 09:26:35 Verrastro, [name] ([id number]) Umbarger, [name] ([id number]) --------------------------------------------------------------------------------  Problem List Details Patient Name: [name], [name] Date of Service: 07/11/2017 9:00 AM Medical Record Number: [id number] Patient Account Number: [id number] Date of Birth/Sex: 09/24/1950 (67 y.o. M) Treating RN: Ahmed Prima Primary Care Provider: Lamonte Sakai Other Clinician: Referring Provider: Lamonte Sakai Treating Provider/Extender: Cathie Olden in Treatment: 78 Active Problems ICD-10 Impacting Encounter Code Description Active Date Wound Healing Diagnosis E11.621 Type 2 diabetes mellitus with foot ulcer 09/21/2016 Yes I87.313 Chronic venous hypertension (idiopathic) with ulcer of 09/21/2016 Yes bilateral lower extremity I89.0 Lymphedema, not elsewhere classified 09/21/2016 Yes L97.522 Non-pressure chronic ulcer of other part of left foot with fat 09/21/2016 Yes layer exposed L97.512 Non-pressure chronic ulcer of other part of right foot with fat 09/21/2016 Yes layer exposed F17.218 Nicotine dependence, cigarettes, with other nicotine-induced 09/21/2016 Yes disorders F10.19 Alcohol abuse with unspecified alcohol-induced disorder 09/21/2016  Yes M86.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, right ankle and foot 02/21/2017 Yes Inactive Problems Resolved Problems Electronic Signature(s) ISEAH, PLOUFF ([id number]) Signed: 07/11/2017 9:30:28 AM By: Lawanda Cousins Entered By: Lawanda Cousins on 07/11/2017 09:30:28 Prisk, [name] ([id number]) -------------------------------------------------------------------------------- Progress Note Details Patient Name: [name], [name] Date of Service: 07/11/2017 9:00 AM Medical Record Number: [id number] Patient Account Number: [id number] Date of Birth/Sex: [birth year] (67 y.o. M) Treating RN: Ahmed Prima Primary Care Provider: Lamonte Sakai Other Clinician: Referring Provider: Lamonte Sakai Treating Provider/Extender: Cathie Olden in Treatment: 95 Subjective Chief Complaint Information obtained from Patient Patient presents for treatment of an open diabetic ulcer to both feet History of Present Illness (HPI) The following HPI elements were documented for the patient's wound: Location: bilateral feet ulceration on the toes Quality: Patient reports experiencing a dull pain to affected area(s). Severity: Patient states wound are getting better Duration: Patient has had the wound for > 3 months prior to seeking treatment at the wound center Timing: Pain in wound is constant (hurts all the time) Context: The wound would happen gradually Modifying Factors: Other treatment(s) tried include:treatment for lymphedema and is seen by the podiatrist Dr. Caryl Comes Associated Signs and Symptoms: Patient reports having increase swelling. 67 year old patient here to see as for bilateral feet ulceration to on his left first and second toe and 2 on his right first and second toe, which she's had for about 4 months. 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 Donella Stade. The patient is  also undergoing workup by medical oncology for a intra-abdominal lymphadenopathy. 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 He is also status post appendectomy, inguinal lymph node biopsy, rectal biopsy and rectal examination under anesthesia. he currently smokes cigarettes about half packet a day. In March of this year he was seen by Dr. Hortencia Pilar, for evaluation of bilateral varicose veins and besides wearing compression stockings he had recommended laser ablation of the right and left great saphenous veins to eleviate the symptoms and complications of severe superficial venous reflux disease. He also recommended lymphedema pumps for better control of his lymphedema. The patient recently has had on 08/23/2016, right greater saphenous vein ablation with the laser energy Earlier lower extremity venous reflux examination done on 05/08/2016 showed no DVT or SVT both lower legs but incompetence  of bilateral great saphenous veins was present. A lower arterial study was also done and there was no significant right lower and left lower extremity problems based on a normal toe brachial index bilaterally and the ABI was 1.21 the left and 1.23 on the right. His post ablation venous duplex examination showed successful ablation of the right GS vein with thrombus formation 2 below the right saphenofemoral junction. The deep system was patent without evidence of thrombosis and this was done on 08/30/2016. 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 Addendum: regarding his x-rays done today and x-ray of the left foot -- IMPRESSION: No objective evidence of osteomyelitis. There are soft tissue changes which may reflect cellulitis. X-ray of the right foot -- IMPRESSION:Findings  compatible with cellulitis of the toes. No objective evidence of osteomyelitis is observed. 10/01/16 on evaluation today patient's wounds appeared to be doing some better. I did review the x-rays as well which showed no evidence of osteomyelitis although there was evidence on x-ray of cellulitis. He fortunately is not having any discomfort [name], [name] ([id number]) although he continues to have some swelling. He does not remember being on any antibiotics recently. 10/15/16 on evaluation today patient's wounds overall appear to be doing better although he does have a new location noted on the left foot. Fortunately he is not having significant pain. It almost has the appearance that something is rubbing on the end of his toes but he wears the open toe shoes and according to what he is telling me never wears anything that would rub on his foot. There is no evidence of infection and specifically no evidence of a fungal infection 10/22/16 On evaluation today patient's wounds appeared to be doing better compared to last week in regard to his bilateral lower extremities. Fortunately I happy with how things are progressing although he still has ulcers I feel like that he is improving and appropriate manner. 11/12/16 on evaluation today patient appears to be doing well in regard to his bilateral feet and the respective wounds. We have been using surrounding her dressings along with an antifungal cream which seems to be doing very well. He has no bilateral dysfunction noticed that the rituals are weight loss at this point. He also has no nausea or vomiting a note purulent discharge. He did see Vein and vascular today and he tells me that they told him he could have surgery for his venous stasis but they did not feel like it was worth it in his words. Fortunately patient's wounds do appear to be getting sneakily better. 11/26/2016 -- he says he is going to have some surgery during this week at [facility] for  possibly a colon resection. 12/31/2016 -- the patient has been noncompliant with his smoking and I'm not sure whether he is also started drinking again. He continues to be very nonchalant about his care 01/14/2017 -- the patient's HandP has been reviewed well and I understand he is being compliant with trying to give up smoking and his local dressing changes. He does not have any surgical options of 4 to him by his vascular surgeons.he was last seen in early August by Dr. Hortencia Pilar who recommended compression stockings,and possibly lymph pumps in 2-3 months after doing a review ultrasound. 01/28/2017 - the patient did not have any fresh complaints but on examination I noted a large lacerated wound on the  plantar aspect of his right fourth toe which had a lot of necrotic debris and it probes down to bone. 02/07/2017 -- x-ray of the right foot -- IMPRESSION: Soft tissue swelling about the first through fourth toes consistent with cellulitis. New destructive change in the tuft of the distal phalanx of the great toe is consistent with osteomyelitis. 02/14/2017 -- the patient's MRI is pending this coming Monday and he still continues to smoke. We have again gone over off loading of his wounds in great detail and he says he's been compliant. 02/21/2017 -- MR of the right foot -- IMPRESSION: 1. Soft tissue ulcer at the tip of the first, second and third toe knows. Cortical irregularity and bone marrow edema in the first distal phalanx most concerning for osteomyelitis. Mild marrow edema in the second and third distal phalanx without definite cortical destruction which may reflect early osteomyelitis versus reactive marrow edema. 2. Soft tissue edema surrounding the first phalanx most consistent with cellulitis. the patient was also recently evaluated by his medical oncologist Dr. 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 She is  treating him with weekly Procrit. She is also keeping intra-abdominal lymphadenopathy and right lower lobe lung nodule under observation. 04/04/2017 -- he was seen by Dr. Adrian Prows on 03/25/2017 -- after review he empirically put him on ciprofloxacin and doxycycline as they have good bone penetration and good bioavailability and it will cover the usual pathogens and diabetic foot osteomyelitis. He will check inflammatory markers and plan a 35-45 week old records. C-reactive protein was 0.3 and the ESR was 72 04/18/17 on evaluation today patient appears to be doing about the same in regard to his lower extremity wounds bilaterally. He has continued to use the antifungal cream which does seem to be beneficial. Nonetheless the ulcers do seem to in some areas be epithelial eyes over and in other areas are still open. He is having no significant discomfort. 04/25/17-he is here in follow-up evaluation for multiple ulcerations to multiple toes bilaterally. He states he did see Dr. Ola Spurr again last week and continues antibiotic therapy. He is voicing no complaints or concerns, will continue with current treatment plan will possibility of adding compression therapy next week after an additional week of treatment/lotions to BLE prescribed by Dr Ola Spurr 05/02/17 he is here in follow up for for multiple ulcers to multiple toes bilaterally. we will stop using antifungal cream and will continue with silvercel and follow up next week [name], [name] ([id number]) 05/09/17-he is here in follow-up for multiple ulcerations to multiple toes bilaterally. There is improvement in appearance. He has not completely stopped using antifungal cream, but admits he has not using it between the toes. He has an appointment with Dr. Ola Spurr on 2/11, continues on doxycycline and Cipro. It has been 5 weeks of antibiotic therapy, we will order plain film xray to evaluate for osteomyelitis next week, prior to follow up  with ID. Will continue with silvercel and follow up next week 05/16/17-he is here in follow-up evaluation for multiple ulcerations to multiple toes bilaterally and new wound to the right posterior heel. There is essentially no change in appearance, deteriorating measurements; he has a history of waxing and waning measurements. He admits that he continues to apply moisturizer/cream/ointment to his toes despite weekly reminders to only apply silvercel to his toes. He states that he thinks the surgical shoe contributed to the superficial ulcer to his posterior heel,  he is unable to articulate if this was an area of dry cracked skin as he has a similar area to the left heel. He now is wearing open toed slippers. He has an appointment with Dr. Ola Spurr on 2/11. We have ordered x-rays for her bilateral feet; he was advised to obtain the x-rays today or tomorrow. He will follow-up next week 05/23/17-he is here in follow-up evaluation for multiple ulcerations to multiple toes bilaterally and the right posterior heel. There is improvement in maceration. He has been compliant and not applying any moisturizing agent to his toes. He has been using Lac-Hydrin for his lower extremities with improvement. He did not go to his appointment on Monday with Dr. Ola Spurr secondary to financial concerns. X-rays for her bilateral feet showed: LEFT FOOT with slight erosion of the tuft of the distal phalanges of the left first and second toe suspicious for osteomyelitis, RIGHT FOOT with 1.erosion of the tufts of the distal phalanges of the right first second and possibly third toes consistent with osteomyelitis, 2 no definitive abnormality of the calcaneus is seen on the images obtained, 3. Plantar calcaneal degenerative spur. We briefly discussed hyperbaric adjunctive therapy for treatment of chronic refractory osteomyelitis. I do not find an a1c in EMR, will contact PCP for record, or order if needed. He has been  encouraged to contact Dr Ola Spurr office regarding the follow-up appointment, encouraged him to inquire about payment plan. We will continue with same treatment plan and follow-up next week. He states he is still taking antibiotics and has "a lot" left. He states he has been taking them as directed, 2 pills twice daily. According to Dr. Blane Ohara office notes he was originally started on 12/17 for 4 weeks and extended on 1/14 for an additional 4 weeks. He should be done with his antibiotic therapy, he was advised to bring his bottles and to his next appointment, we will contact pharmacy. 05/30/17-he is here in follow-up evaluation for multiple ulcerations to multiple toes bilaterally and the right posterior heel. He is accompanied by his brother-in-law. Wounds are stable. He has yet to make up with Dr. Ola Spurr. We contacted his PCP, with no record of recent A1c we will draw an A1c. His brother-in-law states that he was taken off all of his diabetic medication secondary to kidney function. 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 The antibiotics were counted and have approximately 2 weeks left, although they should be complete. The pill organizer reveals missing days. We discussed the need for consistent medications, to have optimal benefit of medication. He has a cousin that lives with him and he will ask her to check his organizer daily. He has been advised to follow up with Dr Ola Spurr, and will go by the office today. He has been advised to quit smoking. 06/06/17-he is here in follow up evaluation. He has had to make an appointment with Dr. Ola Spurr. He did have blood work obtained, a1c 5. He continues to take antibiotic therapy. Significant improvement in bilateral lower extremity edema with compression therapy. Essentially no change in ulcerations to toes. He states he is "going to try  something different" and "let me know next week" if it works; he would not provide any additional information and was encouraged to follow our orders. We will follow up next week 06/13/17-he is here in follow-up evaluation. He has an appointment with Dr. Ola Spurr tomorrow morning. He states he purchased  an ointment from [facility] and applied to his toes for 3 days, he does not remember the name of the ointment. There is improvement to his wounds, minimal maceration. He continues to take antibiotic therapy, this should have been completed last month. His brother-in-law who regularly accompanies his appointments was asked to take the bottles to the appointment tomorrow with Dr. Ola Spurr so he is aware. We will continue with 3 layer compression, and order OPEN TOE compression 20-30mmHg; we will apply compression stockings next week. He continues to smoke, smoked "2 cigarettes" last week 06/20/17 on evaluation today patient did receive his compression stockings which he has with him today for both lower extremities. With that being said he tells me at this point in time that he is very happy to have these he really is not a big fan of the compression wraps that we have been utilizing although they have been of great benefit for him. Nonetheless at this point he does want to switch to the compression stockings. In my opinion as long as he is continuing with compression I'm okay with the stockings or the wraps. 06/27/17-he is here in follow-up evaluation for multiple ulcerations to his bilateral toes. There is some improvement in appearance. He is compliant in wearing his compression stockings with significant improvement in lower extremity edema. He saw Dr Ola Spurr on 3/8, per his notes they would redraw ESR and CRP; plan to continue antibiotic therapy if these remain elevated. I do not see an ESR or CRP level in Epic. The patient continues to take antibiotics. 07/11/17-He is here in follow-up violation  for multiple ulcerations to multiple toes bilaterally. He presents with complete epithelialization to the right third toe; there has been no deterioration. He continues on antibiotic therapy. He will follow-up next week [name], [name] ([id number]) Patient History Information obtained from Patient. Social History Current every day smoker, Marital Status - Widowed, Alcohol Use - Daily - quit drinking about a week ago, Drug Use - No History, Caffeine Use - Moderate. Medical And Surgical History Notes Oncologic 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 Objective Constitutional Vitals Time Taken: 9:10 AM, Height: 69 in, Weight: 168 lbs, BMI: 24.8, Temperature: 98.1 F, Pulse: 74 bpm, Respiratory Rate: 16 breaths/min, Blood Pressure: 162/82 mmHg. Integumentary (Hair, Skin) Wound #1 status is Open. Original cause of wound was Gradually Appeared. The wound is located on the Right Toe Great. The wound measures 0.6cm length x 0.6cm width x 0.1cm depth; 0.283cm^2 area and 0.028cm^3 volume. There is Fat Layer (Subcutaneous Tissue) Exposed exposed. There is no tunneling or undermining noted. There is a large amount of serosanguineous drainage noted. The wound margin is flat and intact. There is large (67-100%) red granulation within the wound bed. There is a small (1-33%) amount of necrotic tissue within the wound bed including Eschar and Adherent Slough. The periwound skin appearance exhibited: Callus, Excoriation, Maceration. The periwound skin appearance did not exhibit: Crepitus, Induration, Rash, Scarring, Dry/Scaly, Atrophie Blanche, Cyanosis, Ecchymosis, Hemosiderin Staining, Mottled, Pallor, Rubor, Erythema. Periwound temperature was noted as No Abnormality. Wound #2 status is Open. Original cause of wound was Gradually Appeared. The wound is located on the Right Toe Second. The wound measures 1cm length x 2.1cm width x 0.1cm depth; 1.649cm^2 area and 0.165cm^3 volume.  There is Fat Layer (Subcutaneous Tissue) Exposed exposed. There is no tunneling or undermining noted. There is a large amount of serosanguineous drainage noted. The wound margin is flat and intact. There is large (67-100%)  red granulation within the wound bed. There is a small (1-33%) amount of necrotic tissue within the wound bed including Adherent Slough. The periwound skin appearance exhibited: Scarring, Maceration. The periwound skin appearance did not exhibit: Callus, Crepitus, Excoriation, Induration, Rash, Dry/Scaly, Atrophie Blanche, Cyanosis, Ecchymosis, Hemosiderin Staining, Mottled, Pallor, Rubor, Erythema. Periwound temperature was noted as No Abnormality. Wound #5 status is Open. Original cause of wound was Gradually Appeared. The wound is located on the Left Toe Great. The wound measures 1.1cm length x 1.8cm width x 0.1cm depth; 1.555cm^2 area and 0.156cm^3 volume. There is Fat Layer (Subcutaneous Tissue) Exposed exposed. There is no tunneling or undermining noted. There is a large amount of serosanguineous drainage noted. Foul odor after cleansing was noted. The wound margin is flat and intact. There is medium (34-66%) red granulation within the wound bed. There is a small (1-33%) amount of necrotic tissue within the wound bed including Adherent Slough. The periwound skin appearance exhibited: Maceration. The periwound skin appearance did not exhibit: Callus, Crepitus, Excoriation, Induration, Rash, Scarring, Dry/Scaly, Atrophie Blanche, Cyanosis, Ecchymosis, Hemosiderin Staining, Mottled, Pallor, Rubor, Erythema. Periwound temperature was noted as No Abnormality. Wound #6 status is Open. Original cause of wound was Gradually Appeared. The wound is located on the Left Toe Second. The wound measures 0.7cm length x 0.8cm width x 0.1cm depth; 0.44cm^2 area and 0.044cm^3 volume. There is Fat Layer (Subcutaneous Tissue) Exposed exposed. There is no tunneling or undermining noted. There is  a large amount of Risko, Dacotah E. ([id number]) serosanguineous drainage noted. The wound margin is flat and intact. There is large (67-100%) red, hyper - granulation within the wound bed. There is a small (1-33%) amount of necrotic tissue within the wound bed including Adherent Slough. The periwound skin appearance exhibited: Maceration. The periwound skin appearance did not exhibit: Callus, Crepitus, Excoriation, Induration, Rash, Scarring, Dry/Scaly, Atrophie Blanche, Cyanosis, Ecchymosis, Hemosiderin Staining, Mottled, Pallor, Rubor, Erythema. Periwound temperature was noted as No Abnormality. Wound #9 status is Healed - Epithelialized. Original cause of wound was Gradually Appeared. The wound is located on the Right Toe Third. The wound measures 0cm length x 0cm width x 0cm depth; 0cm^2 area and 0cm^3 volume. 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 There was a Excisional Skin/Subcutaneous Tissue Debridement with a total area of 0.36 sq cm performed by Lawanda Cousins, NP. With the following instrument(s): Curette. to remove Viable and Non-Viable tissue/material Material removed includes Callus, Subcutaneous Tissue, and  Slough, Fibrin/Exudate, and [place] after achieving pain control using Lidocaine 4% Topical Solution. No specimens were taken. A time out was conducted at 09:18, prior to the start of the procedure. A Minimum amount of bleeding was controlled with Pressure. The procedure was tolerated well with a pain level of 0 throughout and a pain level of 0 following the procedure. Post Debridement Measurements: 0.6cm length x 0.6cm  width x 0.2cm depth; 0.057cm^3 volume. Character of Wound/Ulcer Post Debridement requires further debridement. Severity of Tissue Post Debridement is: Fat layer exposed. Post procedure Diagnosis Wound #1: Same as Pre-Procedure Wound #2 Pre-procedure diagnosis of Wound #2 is a Diabetic Wound/Ulcer of the Lower Extremity located on the Right Toe Second .Severity of Tissue Pre Debridement is: Fat layer exposed. There was a Excisional Skin/Subcutaneous Tissue Debridement with a total area of 2.1 sq cm performed by Lawanda Cousins, NP. With the following instrument(s): Curette. to remove Viable and Non-Viable tissue/material Material removed includes Subcutaneous Tissue, and Slough, Fibrin/Exudate, and [place] after achieving pain control using Lidocaine 4% Topical Solution. No specimens were taken. A time out was conducted at 09:18, prior to the start of the procedure. A Minimum amount of bleeding was controlled with Pressure. The procedure was tolerated well with a pain level of 0 throughout and a pain level of 0 following the procedure. Post Debridement Measurements: 1cm length x 2.1cm width x 0.2cm depth; 0.33cm^3 volume. Character of Wound/Ulcer Post Debridement requires further debridement. Severity of Tissue Post Debridement is: Fat layer exposed. ALLYN, BERTONI ([id number]) Post procedure Diagnosis Wound #2: Same as Pre-Procedure Wound #5 Pre-procedure diagnosis of Wound #5 is a Diabetic Wound/Ulcer of the Lower Extremity located on the Left Toe Great .Severity of Tissue Pre  Debridement is: Limited to breakdown of skin. There was a Selective/Open Wound Skin/Epidermis Debridement with a total area of 1.98 sq cm performed by Lawanda Cousins, NP. With the following instrument(s): Curette. to remove Viable tissue/material Material removed includes Skin: Dermis, Skin: Epidermis after achieving pain control using Lidocaine 4% Topical Solution. A time out was conducted at 09:18, prior to the start of the procedure. A Minimum amount of bleeding was controlled with Pressure. The procedure was tolerated well with a pain level of 0 throughout and a pain level of 0 following the procedure. Post Debridement Measurements: 1.1cm length x 1.8cm width x 0.2cm depth; 0.311cm^3 volume. Character of Wound/Ulcer Post Debridement requires further debridement. Severity of Tissue Post Debridement is: Fat layer exposed. Post procedure Diagnosis Wound #5: Same as Pre-Procedure Wound #6 Pre-procedure diagnosis of Wound #6 is a Diabetic Wound/Ulcer of the Lower Extremity located on the Left Toe Second .Severity of Tissue Pre Debridement is: Fat layer exposed. There was a Excisional Skin/Subcutaneous Tissue Debridement with a total area of 0.56 sq cm performed by Lawanda Cousins, NP. With the following instrument(s): Curette. to remove Viable and Non-Viable tissue/material Material removed includes Subcutaneous Tissue, Slough, [place], and Hyper-granulation after achieving pain control using Lidocaine 4% Topical Solution. No specimens were taken. A time out was conducted at 09:18, prior to the start of the procedure. A Minimum amount of bleeding was controlled with Pressure. The procedure was tolerated well with a pain level of 0 throughout and a pain level of 0 following the procedure. Post Debridement Measurements: 0.7cm length x 0.8cm width x 0.2cm depth; 0.088cm^3 volume. Character of Wound/Ulcer Post Debridement requires further debridement. Severity of Tissue Post Debridement is:  Fat layer exposed. Post procedure Diagnosis Wound #6: Same as Pre-Procedure Plan Wound Cleansing: Wound #1 Right Toe Great: Clean wound with Normal Saline. May Shower, gently pat wound dry prior to applying new dressing. Wound #2 Right Toe Second: Clean wound with Normal Saline. May Shower, gently pat wound dry prior to applying new dressing. Wound #5 Left Toe Great: Clean wound with Normal Saline. May Shower, gently pat wound dry prior to applying new dressing. Wound #6 Left Toe Second: Clean  wound with Normal Saline. May Shower, gently pat wound dry prior to applying new dressing. Anesthetic (add to Medication List): Wound #1 Right Toe Great: Topical Lidocaine 4% cream applied to wound bed prior to debridement (In Clinic Only). Wound #2 Right Toe Second: Topical Lidocaine 4% cream applied to wound bed prior to debridement (In Clinic Only). Wound #5 Left Toe Great: Topical Lidocaine 4% cream applied to wound bed prior to debridement (In Clinic Only). Wound #6 Left Toe Second: Topical Lidocaine 4% cream applied to wound bed prior to debridement (In Clinic Only). JAXSUN, CIAMPI ([id number]) Primary Wound Dressing: Wound #1 Right Toe Great: Silver Alginate - silvercel Wound #2 Right Toe Second: Silver Alginate - silvercel Wound #5 Left Toe Great: Silver Alginate - silvercel Wound #6 Left Toe Second: Silver Alginate - silvercel Secondary Dressing: Wound #1 Right Toe Great: ABD and Kerlix/Conform Wound #2 Right Toe Second: ABD and Kerlix/Conform Wound #5 Left Toe Great: ABD and Kerlix/Conform Wound #6 Left Toe Second: ABD and Kerlix/Conform Dressing Change Frequency: Wound #1 Right Toe Great: Change dressing every day. Wound #2 Right Toe Second: Change dressing every day. Wound #5 Left Toe Great: Change dressing every day. Wound #6 Left Toe Second: Change dressing every day. Follow-up Appointments: Wound #1 Right Toe Great: Return Appointment in 2 weeks. Wound #2  Right Toe Second: Return Appointment in 2 weeks. Wound #5 Left Toe Great: Return Appointment in 2 weeks. Wound #6 Left Toe Second: Return Appointment in 2 weeks. Edema Control: Patient to wear own compression stockings Elevate legs to the level of the heart and pump ankles as often as possible Additional Orders / Instructions: Wound #1 Right Toe Great: Stop Smoking Increase protein intake. Other: - Please add vitamin A, vitamin C and zinc supplements to your diet Wound #2 Right Toe Second: Stop Smoking Increase protein intake. Other: - Please add vitamin A, vitamin C and zinc supplements to your diet Wound #5 Left Toe Great: Stop Smoking Increase protein intake. Other: - Please add vitamin A, vitamin C and zinc supplements to your diet Wound #6 Left Toe Second: Stop Smoking Increase protein intake. Other: - Please add vitamin A, vitamin C and zinc supplements to your diet The following medication(s) was prescribed: lidocaine topical 4 % cream 1 1 cream topical was prescribed at facility KWABENA, STRUTZ ([id number]) Electronic Signature(s) Signed: 07/11/2017 9:39:04 AM By: Lawanda Cousins Entered By: Lawanda Cousins on 07/11/2017 09:39:04 Naugle, [name] ([id number]) -------------------------------------------------------------------------------- ROS/PFSH Details Patient Name: Kimball, Renee E. Date of Service: 07/11/2017 9:00 AM Medical Record Number: [id number] Patient Account Number: [id number] Date of Birth/Sex: 19-Jan-1951 (66 y.o. M) Treating RN: Ahmed Prima Primary Care Provider: Lamonte Sakai Other Clinician: Referring Provider: Lamonte Sakai Treating Provider/Extender: Cathie Olden in Treatment: 26 Information Obtained From Patient Wound History Do you currently have one or more open woundso Yes How many open wounds do you currently haveo 6 Approximately how long have you had your woundso 3 months How have you been treating your wound(s) until nowo ointment and  bandage Has your wound(s) ever healed and then re-openedo No Have you had any lab work done in the past montho No Have you tested positive for an antibiotic resistant organism (MRSA, VRE)o No Have you tested positive for osteomyelitis (bone infection)o No Have you had any tests for circulation on your legso Yes Who ordered the testo PCP Where was the test doneo [facility] Medical History: Negative for: Cataracts; Glaucoma; Optic Neuritis Ear/Nose/Mouth/Throat Medical History: Negative for: Chronic sinus  problems/congestion; Middle ear problems Hematologic/Lymphatic Medical History: Positive for: Anemia; Lymphedema Negative for: Hemophilia; Human Immunodeficiency Virus; Sickle Cell Disease Respiratory Medical History: Negative for: Aspiration; Asthma; Chronic Obstructive Pulmonary Disease (COPD); Pneumothorax; Sleep Apnea; Tuberculosis Cardiovascular Medical History: Positive for: Congestive Heart Failure; Hypertension; Peripheral Venous Disease Negative for: Angina; Arrhythmia; Coronary Artery Disease; Deep Vein Thrombosis; Hypotension; Myocardial Infarction; Peripheral Arterial Disease; Phlebitis; Vasculitis Gastrointestinal Medical History: Negative for: Cirrhosis ; Colitis; Crohnos; Hepatitis A; Hepatitis B; Hepatitis C [name], [name] E. ([id number]) Endocrine Medical History: Positive for: Type II Diabetes Treated with: Oral agents Blood sugar tested every day: Yes Tested : QD Genitourinary Medical History: Negative for: End Stage Renal Disease Immunological Medical History: Negative for: Lupus Erythematosus; Raynaudos; Scleroderma Integumentary (Skin) Medical History: Negative for: History of Burn; History of pressure wounds Musculoskeletal Medical History: Negative for: Gout; Rheumatoid Arthritis; Osteoarthritis; Osteomyelitis Neurologic Medical History: Positive for: Neuropathy Negative for: Dementia; Quadriplegia; Paraplegia; Seizure  Disorder Oncologic Medical History: Past Medical History Notes: 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 Electronic Signature(s) Signed: 07/11/2017 4:32:30 PM By: Alric Quan Signed: 07/11/2017 4:47:35 PM By: Stanton Kidney ([id number]) Entered By: Lawanda Cousins on 07/11/2017 09:38:47 Ciani, [name] ([id number]) -------------------------------------------------------------------------------- SuperBill Details Patient Name: Palladino, Christo E. Date of Service: 07/11/2017 Medical Record Number: [id number] Patient Account Number: [id number] Date of Birth/Sex: 03/24/1951 (67 y.o. M) Treating RN: Ahmed Prima Primary Care Provider: Lamonte Sakai Other Clinician: Referring Provider: Lamonte Sakai Treating Provider/Extender: Cathie Olden in Treatment: 41 Diagnosis Coding ICD-10 Codes Code Description E11.621 Type 2 diabetes mellitus with foot ulcer I87.313 Chronic venous hypertension (idiopathic) with ulcer of bilateral lower extremity I89.0 Lymphedema, not elsewhere classified L97.522 Non-pressure chronic ulcer of other part of left foot with fat layer exposed L97.512 Non-pressure chronic ulcer of other part of right foot with fat layer exposed F17.218 Nicotine dependence, cigarettes, 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 abuse with unspecified alcohol-induced  disorder M86.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, right ankle and foot Facility Procedures CPT4 Code: 88828003 Description: 11042 - DEB SUBQ TISSUE 20 SQ CM/< ICD-10 Diagnosis Description L97.522 Non-pressure chronic ulcer of other part of left foot with fat lay E11.621 Type 2 diabetes mellitus with foot ulcer Modifier: er exposed Quantity: 1 CPT4 Code: [id number] Description: 56979 - DEBRIDE WOUND 1ST 20 SQ CM OR < ICD-10 Diagnosis Description L97.522 Non-pressure chronic ulcer of other part of left foot with fat lay L97.512 Non-pressure chronic ulcer of other part of right foot with fat la E11.621 Type 2  diabetes mellitus with foot ulcer Modifier: er exposed yer exposed Quantity: 1 Physician Procedures CPT4 Code: 4801655 Description: 11042 - WC PHYS SUBQ TISS 20 SQ CM ICD-10 Diagnosis Description L97.522 Non-pressure chronic ulcer of other part of left foot with fat lay E11.621 Type 2 diabetes mellitus with foot ulcer Modifier: er exposed Quantity: 1 CPT4 Code: [id number] Tomer, Payam Description: 97597 - WC PHYS DEBR WO ANESTH 20 SQ CM ICD-10 Diagnosis Description L97.522 Non-pressure chronic ulcer of other part of left foot with fat lay  P53.005 Non-pressure chronic ulcer of other part of right foot with fat la E11.621 Type 2  diabetes mellitus with foot ulcer E. ([id number]) Modifier: er exposed yer exposed Quantity: 1 Electronic Signature(s) Signed: 07/11/2017 9:39:35 AM By: Lawanda Cousins Entered By: Lawanda Cousins on 07/11/2017 09:39:35

## 2017-07-18 ENCOUNTER — Encounter: Payer: Medicare HMO | Admitting: Nurse Practitioner

## 2017-07-18 DIAGNOSIS — E11621 Type 2 diabetes mellitus with foot ulcer: Secondary | ICD-10-CM | POA: Diagnosis not present

## 2017-07-25 ENCOUNTER — Inpatient Hospital Stay: Payer: Medicare HMO

## 2017-07-25 ENCOUNTER — Encounter: Payer: Medicare HMO | Admitting: Nurse Practitioner

## 2017-07-25 ENCOUNTER — Inpatient Hospital Stay: Payer: Medicare HMO | Attending: Oncology

## 2017-07-25 VITALS — BP 140/73 | HR 57

## 2017-07-25 DIAGNOSIS — F17210 Nicotine dependence, cigarettes, uncomplicated: Secondary | ICD-10-CM | POA: Diagnosis not present

## 2017-07-25 DIAGNOSIS — D472 Monoclonal gammopathy: Secondary | ICD-10-CM | POA: Insufficient documentation

## 2017-07-25 DIAGNOSIS — I130 Hypertensive heart and chronic kidney disease with heart failure and stage 1 through stage 4 chronic kidney disease, or unspecified chronic kidney disease: Secondary | ICD-10-CM | POA: Diagnosis present

## 2017-07-25 DIAGNOSIS — I509 Heart failure, unspecified: Secondary | ICD-10-CM | POA: Diagnosis not present

## 2017-07-25 DIAGNOSIS — D631 Anemia in chronic kidney disease: Secondary | ICD-10-CM | POA: Diagnosis not present

## 2017-07-25 DIAGNOSIS — Z79899 Other long term (current) drug therapy: Secondary | ICD-10-CM | POA: Diagnosis not present

## 2017-07-25 DIAGNOSIS — Z85828 Personal history of other malignant neoplasm of skin: Secondary | ICD-10-CM | POA: Diagnosis not present

## 2017-07-25 DIAGNOSIS — Z923 Personal history of irradiation: Secondary | ICD-10-CM | POA: Insufficient documentation

## 2017-07-25 DIAGNOSIS — K219 Gastro-esophageal reflux disease without esophagitis: Secondary | ICD-10-CM | POA: Insufficient documentation

## 2017-07-25 DIAGNOSIS — N189 Chronic kidney disease, unspecified: Principal | ICD-10-CM

## 2017-07-25 DIAGNOSIS — E11621 Type 2 diabetes mellitus with foot ulcer: Secondary | ICD-10-CM | POA: Diagnosis not present

## 2017-07-25 DIAGNOSIS — E1122 Type 2 diabetes mellitus with diabetic chronic kidney disease: Secondary | ICD-10-CM | POA: Diagnosis not present

## 2017-07-25 DIAGNOSIS — D509 Iron deficiency anemia, unspecified: Secondary | ICD-10-CM

## 2017-07-25 DIAGNOSIS — J90 Pleural effusion, not elsewhere classified: Secondary | ICD-10-CM | POA: Diagnosis not present

## 2017-07-25 LAB — CBC WITH DIFFERENTIAL/PLATELET
BASOS ABS: 0.1 10*3/uL (ref 0–0.1)
Basophils Relative: 1 %
EOS PCT: 7 %
Eosinophils Absolute: 0.4 10*3/uL (ref 0–0.7)
HEMATOCRIT: 25.5 % — AB (ref 40.0–52.0)
Hemoglobin: 8.7 g/dL — ABNORMAL LOW (ref 13.0–18.0)
LYMPHS PCT: 20 %
Lymphs Abs: 1.1 10*3/uL (ref 1.0–3.6)
MCH: 29.6 pg (ref 26.0–34.0)
MCHC: 34.2 g/dL (ref 32.0–36.0)
MCV: 86.6 fL (ref 80.0–100.0)
MONO ABS: 0.6 10*3/uL (ref 0.2–1.0)
MONOS PCT: 11 %
NEUTROS ABS: 3.4 10*3/uL (ref 1.4–6.5)
Neutrophils Relative %: 61 %
PLATELETS: 189 10*3/uL (ref 150–440)
RBC: 2.94 MIL/uL — ABNORMAL LOW (ref 4.40–5.90)
RDW: 17.7 % — ABNORMAL HIGH (ref 11.5–14.5)
WBC: 5.6 10*3/uL (ref 3.8–10.6)

## 2017-07-25 MED ORDER — DARBEPOETIN ALFA 100 MCG/0.5ML IJ SOSY
100.0000 ug | PREFILLED_SYRINGE | INTRAMUSCULAR | Status: AC
  Administered 2017-07-25: 100 ug via SUBCUTANEOUS
  Filled 2017-07-25: qty 0.5

## 2017-07-26 NOTE — Progress Notes (Signed)
AVIK, LEONI ([id number]) Visit Report for 07/18/2017 Arrival Information Details Patient Name: [name], [name] Date of Service: 07/18/2017 8:30 AM Medical Record Number: [id number] Patient Account Number: [id number] Date of Birth/Sex: November 20, 1950 (67 y.o. M) Treating RN: Roger Shelter Primary Care Pavel Gadd: Lamonte Sakai Other Clinician: Referring Marque Bango: Lamonte Sakai Treating Emmerich Cryer/Extender: Cathie Olden in Treatment: 78 Visit Information History Since Last Visit All ordered tests and consults were completed: No Patient Arrived: Ambulatory Added or deleted any medications: No Arrival Time: 08:36 Any new allergies or adverse reactions: No Accompanied By: brother Had a fall or experienced change in No Transfer Assistance: None activities of daily living that may affect Patient Identification Verified: Yes risk of falls: Secondary Verification Process Completed: Yes Signs or symptoms of abuse/neglect since last visito No Patient Requires Transmission-Based No Hospitalized since last visit: No Precautions: Implantable device outside of the clinic excluding No Patient Has Alerts: Yes cellular tissue based products placed in the center Patient Alerts: DMII since last visit: Pain Present Now: No Electronic Signature(s) Signed: 07/19/2017 4:04:12 PM By: Roger Shelter Entered By: Roger Shelter on 07/18/2017 08:36:37 Everton, [name] ([id number]) -------------------------------------------------------------------------------- Encounter Discharge Information Details Patient Name: Youkhana, Bashar E. Date of Service: 07/18/2017 8:30 AM Medical Record Number: [id number] Patient Account Number: [id number] Date of Birth/Sex: 1950-10-19 (67 y.o. M) Treating RN: Ahmed Prima Primary Care Anjulie Dipierro: Lamonte Sakai Other Clinician: Referring Leshaun Biebel: Lamonte Sakai Treating Tamber Burtch/Extender: Cathie Olden in Treatment: 82 Encounter Discharge Information Items Schedule  Follow-up Appointment: No Medication Reconciliation completed and No provided to Patient/Care Reinhold Rickey: Provided on Clinical Summary of Care: 07/18/2017 Form Type Recipient Paper Patient ED Electronic Signature(s) Signed: 07/19/2017 4:32:37 PM By: Ruthine Dose Entered By: Ruthine Dose on 07/18/2017 09:19:04 Enge, [name] ([id number]) -------------------------------------------------------------------------------- Lower Extremity Assessment Details Patient Name: Knaggs, Sion E. Date of Service: 07/18/2017 8:30 AM Medical Record Number: [id number] Patient Account Number: [id number] Date of Birth/Sex: April 22, 1950 (67 y.o. M) Treating RN: Montey Hora Primary Care Ajna Moors: Lamonte Sakai Other Clinician: Referring Sabina Beavers: Lamonte Sakai Treating Gerald Kuehl/Extender: Cathie Olden in Treatment: 42 Edema Assessment Assessed: [Left: No] [Right: No] [Left: Edema] [Right: :] Calf Left: Right: Point of Measurement: 34 cm From Medial Instep 34.5 cm 35.3 cm Ankle Left: Right: Point of Measurement: 12 cm From Medial Instep 25.2 cm 25.4 cm Vascular Assessment Pulses: Dorsalis Pedis Palpable: [Left:Yes] [Right:Yes] Posterior Tibial Extremity colors, hair growth, and conditions: Extremity Color: [Left:Hyperpigmented] [Right:Hyperpigmented] Hair Growth on Extremity: [Left:No] [Right:No] Temperature of Extremity: [Left:Warm] [Right:Warm] Capillary Refill: [Left:< 3 seconds] [Right:< 3 seconds] Electronic Signature(s) Signed: 07/18/2017 3:01:24 PM By: Montey Hora Entered By: Montey Hora on 07/18/2017 08:44:28 Aslinger, Kahron E. ([id number]) -------------------------------------------------------------------------------- Multi Wound Chart Details Patient Name: Golembeski, Benino E. Date of Service: 07/18/2017 8:30 AM Medical Record Number: [id number] Patient Account Number: [id number] Date of Birth/Sex: 11/12/1950 (67 y.o. M) Treating RN: Ahmed Prima Primary Care Shakiah Wester: Lamonte Sakai Other Clinician: Referring Sherri Levenhagen: Lamonte Sakai Treating Adanna Zuckerman/Extender: Cathie Olden in Treatment: 42 Vital Signs Height(in): 17 Pulse(bpm): 90 Weight(lbs): 168 Blood Pressure(mmHg): 136/81 Body Mass Index(BMI): 25 Temperature(F): 97.7 Respiratory Rate 16 (breaths/min): Photos: [1:No Photos] [2:No Photos] [5:No Photos] Wound Location: [1:Right Toe Great] [2:Right Toe Second] [5:Left Toe Great] Wounding Event: [1:Gradually Appeared] [2:Gradually Appeared] [5:Gradually Appeared] Primary Etiology: [1:Diabetic Wound/Ulcer of the Lower Extremity] [2:Diabetic Wound/Ulcer of the Lower Extremity] [5:Diabetic Wound/Ulcer of the Lower Extremity] Comorbid History: [1:Anemia, Lymphedema, Congestive Heart Failure, Hypertension, Peripheral Venous Disease, Type II Diabetes, Neuropathy] [2:Anemia, Lymphedema, Congestive Heart Failure, Hypertension, Peripheral Venous Disease,  Type II Diabetes,  Neuropathy] [5:Anemia, Lymphedema, Congestive Heart Failure, Hypertension, Peripheral Venous Disease, Type II Diabetes, Neuropathy] Date Acquired: [1:06/11/2016] [2:06/11/2016] [5:06/11/2016] Weeks of Treatment: [1:42] [2:42] [5:42] Wound Status: [1:Open] [2:Open] [5:Open] Pending Amputation on [1:Yes] [2:Yes] [5:Yes] Presentation: Measurements L x W x D [1:0.4x0.6x0.1] [2:0.6x1.4x0.1] [5:0.8x1.9x0.1] (cm) Area (cm) : [1:0.188] [2:0.66] [5:1.194] Volume (cm) : [1:0.019] [2:0.066] [5:0.119] % Reduction in Area: [1:99.60%] [2:78.50%] [5:91.60%] % Reduction in Volume: [1:99.60%] [2:78.50%] [5:91.70%] Classification: [1:Grade 2] [2:Grade 2] [5:Grade 2] Exudate Amount: [1:Large] [2:Large] [5:Large] Exudate Type: [1:Serosanguineous] [2:Serosanguineous] [5:Serosanguineous] Exudate Color: [1:red, brown] [2:red, brown] [5:red, brown] Foul Odor After Cleansing: [1:No] [2:No] [5:Yes] Odor Anticipated Due to [1:N/A] [2:N/A] [5:No] Product Use: Wound Margin: [1:Flat and Intact] [2:Flat and Intact]  [5:Flat and Intact] Granulation Amount: [1:Large (67-100%)] [2:Large (67-100%)] [5:Medium (34-66%)] Granulation Quality: [1:Red] [2:Red] [5:Red] Necrotic Amount: [1:Small (1-33%)] [2:Small (1-33%)] [5:Small (1-33%)] Necrotic Tissue: [1:Eschar, Adherent Slough] [2:Adherent Slough] [5:Adherent Slough] Exposed Structures: [1:Fat Layer (Subcutaneous Tissue) Exposed: Yes Fascia: No Tendon: No] [2:Fat Layer (Subcutaneous Tissue) Exposed: Yes Fascia: No Tendon: No] [5:Fat Layer (Subcutaneous Tissue) Exposed: Yes Fascia: No Tendon: No] Muscle: No Muscle: No Muscle: No Joint: No Joint: No Joint: No Bone: No Bone: No Bone: No Epithelialization: Small (1-33%) Medium (34-66%) Small (1-33%) Debridement: Debridement - Excisional Debridement - Excisional Debridement - Excisional Pre-procedure 08:57 08:57 08:57 Verification/Time Out Taken: Pain Control: Lidocaine 4% Topical Solution Lidocaine 4% Topical Solution Lidocaine 4% Topical Solution Tissue Debrided: Subcutaneous, Slough Subcutaneous, Slough Subcutaneous, Slough Level: Skin/Subcutaneous Tissue Skin/Subcutaneous Tissue Skin/Subcutaneous Tissue Debridement Area (sq cm): 0.24 0.84 1.52 Instrument: Curette Curette Curette Bleeding: Minimum Minimum Minimum Hemostasis Achieved: Pressure Pressure Pressure Procedural Pain: 0 0 0 Post Procedural Pain: 0 0 0 Debridement Treatment Procedure was tolerated well Procedure was tolerated well Procedure was tolerated well Response: Post Debridement 0.4x0.6x0.2 0.6x1.4x0.2 0.8x1.9x0.2 Measurements L x W x D (cm) Post Debridement Volume: 0.038 0.132 0.239 (cm) Periwound Skin Texture: Excoriation: Yes Scarring: Yes Excoriation: No Callus: Yes Excoriation: No Induration: No Induration: No Induration: No Callus: No Crepitus: No Callus: No Crepitus: No Rash: No Crepitus: No Rash: No Scarring: No Rash: No Scarring: No Periwound Skin Moisture: Maceration: Yes Maceration: Yes Maceration:  Yes Dry/Scaly: No Dry/Scaly: No Dry/Scaly: No Periwound Skin Color: Atrophie Blanche: No Atrophie Blanche: No Atrophie Blanche: No Cyanosis: No Cyanosis: No Cyanosis: No Ecchymosis: No Ecchymosis: No Ecchymosis: No Erythema: No Erythema: No Erythema: No Hemosiderin Staining: No Hemosiderin Staining: No Hemosiderin Staining: No Mottled: No Mottled: No Mottled: No Pallor: No Pallor: No Pallor: No Rubor: No Rubor: No Rubor: No Temperature: No Abnormality No Abnormality No Abnormality Tenderness on Palpation: No No No Wound Preparation: Ulcer Cleansing: Ulcer Cleansing: Ulcer Cleansing: Rinsed/Irrigated with Saline Rinsed/Irrigated with Saline Rinsed/Irrigated with Saline Topical Anesthetic Applied: Topical Anesthetic Applied: Topical Anesthetic Applied: Other: lidocaine 4% Other: lidocaine 4% Other: lidocaine 4% Procedures Performed: Debridement Debridement Debridement Wound Number: 6 N/A N/A Photos: No Photos N/A N/A Wound Location: Left Toe Second N/A N/A Wounding Event: Gradually Appeared N/A N/A Primary Etiology: Diabetic Wound/Ulcer of the N/A N/A Lower Extremity Comorbid History: Anemia, Lymphedema, N/A N/A Congestive Heart Failure, Hypertension, Peripheral Norrington, Keylon E. ([id number]) Venous Disease, Type II Diabetes, Neuropathy Date Acquired: 06/11/2016 N/A N/A Weeks of Treatment: 42 N/A N/A Wound Status: Open N/A N/A Pending Amputation on Yes N/A N/A Presentation: Measurements L x W x D 0.5x0.7x0.1 N/A N/A (cm) Area (cm) : 0.275 N/A N/A Volume (cm) : 0.027 N/A N/A % Reduction in Area: 87.10% N/A N/A %  Reduction in Volume: 87.40% N/A N/A Classification: Grade 2 N/A N/A Exudate Amount: Large N/A N/A Exudate Type: Serosanguineous N/A N/A Exudate Color: red, brown N/A N/A Foul Odor After Cleansing: No N/A N/A Odor Anticipated Due to N/A N/A N/A Product Use: Wound Margin: Flat and Intact N/A N/A Granulation Amount: Large (67-100%) N/A  N/A Granulation Quality: Red, Hyper-granulation N/A N/A Necrotic Amount: Small (1-33%) N/A N/A Necrotic Tissue: Adherent Slough N/A N/A Exposed Structures: Fat Layer (Subcutaneous N/A N/A Tissue) Exposed: Yes Fascia: No Tendon: No Muscle: No Joint: No Bone: No Epithelialization: Small (1-33%) N/A N/A Debridement: Debridement - Excisional N/A N/A Pre-procedure 08:57 N/A N/A Verification/Time Out Taken: Pain Control: Lidocaine 4% Topical Solution N/A N/A Tissue Debrided: Subcutaneous, Slough N/A N/A Level: Skin/Subcutaneous Tissue N/A N/A Debridement Area (sq cm): 0.35 N/A N/A Instrument: Curette N/A N/A Bleeding: Minimum N/A N/A Hemostasis Achieved: Pressure N/A N/A Procedural Pain: 0 N/A N/A Post Procedural Pain: 0 N/A N/A Debridement Treatment Procedure was tolerated well N/A N/A Response: Post Debridement 0.5x0.7x0.2 N/A N/A Measurements L x W x D (cm) Post Debridement Volume: 0.055 N/A N/A (cm) Periwound Skin Texture: Excoriation: No N/A N/A Induration: No Callus: No Crepitus: No Smeal, Ebony E. ([id number]) Rash: No Scarring: No Periwound Skin Moisture: Maceration: Yes N/A N/A Dry/Scaly: No Periwound Skin Color: Atrophie Blanche: No N/A N/A Cyanosis: No Ecchymosis: No Erythema: No Hemosiderin Staining: No Mottled: No Pallor: No Rubor: No Temperature: No Abnormality N/A N/A Tenderness on Palpation: No N/A N/A Wound Preparation: Ulcer Cleansing: N/A N/A Rinsed/Irrigated with Saline Topical Anesthetic Applied: Other: lidocaine 4% Procedures Performed: Debridement N/A N/A Treatment Notes Wound #1 (Right Toe Great) 1. Cleansed with: Clean wound with Normal Saline 2. Anesthetic Topical Lidocaine 4% cream to wound bed prior to debridement 4. Dressing Applied: Other dressing (specify in notes) 5. Secondary Dressing Applied ABD Pad Dry Gauze Notes silvercell, gauze conform and tape with darko shoes bilateral Wound #2 (Right Toe Second) 1. Cleansed  with: Clean wound with Normal Saline 2. Anesthetic Topical Lidocaine 4% cream to wound bed prior to debridement 4. Dressing Applied: Other dressing (specify in notes) 5. Secondary Dressing Applied ABD Pad Dry Gauze Notes silvercell, gauze conform and tape with darko shoes bilateral Wound #5 (Left Toe Great) 1. Cleansed with: Clean wound with Normal Saline 2. Anesthetic Topical Lidocaine 4% cream to wound bed prior to debridement Radman, Rahshawn E. ([id number]) 4. Dressing Applied: Other dressing (specify in notes) 5. Secondary Dressing Applied ABD Pad Dry Gauze Notes silvercell, gauze conform and tape with darko shoes bilateral Wound #6 (Left Toe Second) 1. Cleansed with: Clean wound with Normal Saline 2. Anesthetic Topical Lidocaine 4% cream to wound bed prior to debridement 4. Dressing Applied: Other dressing (specify in notes) 5. Secondary Dressing Applied ABD Pad Dry Gauze Notes silvercell, gauze conform and tape with darko shoes bilateral Electronic Signature(s) Signed: 07/18/2017 9:22:33 AM By: Lawanda Cousins Entered By: Lawanda Cousins on 07/18/2017 09:22:32 Traxler, [name] ([id number]) -------------------------------------------------------------------------------- [place] Details Patient Name: Bauernfeind, Belen E. Date of Service: 07/18/2017 8:30 AM Medical Record Number: [id number] Patient Account Number: [id number] Date of Birth/Sex: 1951/03/27 (67 y.o. M) Treating RN: Ahmed Prima Primary Care Hyde Sires: Lamonte Sakai Other Clinician: Referring Jackey Housey: Lamonte Sakai Treating Zakariyah Freimark/Extender: Cathie Olden in Treatment: 13 Active Inactive ` Abuse / Safety / Falls / Self Care Management Nursing Diagnoses: Potential for falls Goals: Patient will remain injury free related to falls Date Initiated: 09/21/2016 Target Resolution Date: 09/14/2017 Goal Status: Active Interventions: Assess fall risk  on admission and as  needed Notes: ` Nutrition Nursing Diagnoses: Potential for alteratiion in Nutrition/Potential for imbalanced nutrition Goals: Patient/caregiver agrees to and verbalizes understanding of need to use nutritional supplements and/or vitamins as prescribed Date Initiated: 09/21/2016 Target Resolution Date: 09/14/2017 Goal Status: Active Interventions: Assess patient nutrition upon admission and as needed per policy Notes: ` Orientation to the Wound Care Program Nursing Diagnoses: Knowledge deficit related to the wound healing center program Goals: Patient/caregiver will verbalize understanding of the [place] Date Initiated: 09/21/2016 Target Resolution Date: 06/15/2017 Goal Status: Active Interventions: Frein, ARK AGRUSA ([id number]) Provide education on orientation to the wound center Notes: ` Wound/Skin Impairment Nursing Diagnoses: Knowledge deficit related to smoking impact on wound healing Goals: Ulcer/skin breakdown will have a volume reduction of 30% by week 4 Date Initiated: 09/21/2016 Target Resolution Date: 08/17/2017 Goal Status: Active Ulcer/skin breakdown will have a volume reduction of 50% by week 8 Date Initiated: 09/21/2016 Target Resolution Date: 08/17/2017 Goal Status: Active Ulcer/skin breakdown will have a volume reduction of 80% by week 12 Date Initiated: 09/21/2016 Target Resolution Date: 09/14/2017 Goal Status: Active Ulcer/skin breakdown will heal within 14 weeks Date Initiated: 09/21/2016 Target Resolution Date: 08/17/2017 Goal Status: Active Interventions: Assess patient/caregiver ability to obtain necessary supplies Assess patient/caregiver ability to perform ulcer/skin care regimen upon admission and as needed Assess ulceration(s) every visit Notes: Electronic Signature(s) Signed: 07/22/2017 4:22:27 PM By: Alric Quan Entered By: Alric Quan on 07/18/2017 08:57:40 Luther, [name]  ([id number]) -------------------------------------------------------------------------------- Pain Assessment Details Patient Name: Brockwell, Cristo E. Date of Service: 07/18/2017 8:30 AM Medical Record Number: [id number] Patient Account Number: [id number] Date of Birth/Sex: [birth year] (67 y.o. M) Treating RN: Roger Shelter Primary Care Syndey Jaskolski: Lamonte Sakai Other Clinician: Referring Jashan Cotten: Lamonte Sakai Treating Stellarose Cerny/Extender: Cathie Olden in Treatment: 30 Active Problems Location of Pain Severity and Description of Pain Patient Has Paino No Site Locations Pain Management and Medication Current Pain Management: Electronic Signature(s) Signed: 07/19/2017 4:04:12 PM By: Roger Shelter Entered By: Roger Shelter on 07/18/2017 08:36:46 Creelman, [name] ([id number]) -------------------------------------------------------------------------------- Wound Assessment Details Patient Name: Stfleur, Nicolaos E. Date of Service: 07/18/2017 8:30 AM Medical Record Number: [id number] Patient Account Number: [id number] Date of Birth/Sex: June 25, 1950 (67 y.o. M) Treating RN: Montey Hora Primary Care Crist Kruszka: Lamonte Sakai Other Clinician: Referring Sayre Witherington: Lamonte Sakai Treating Kamela Blansett/Extender: Cathie Olden in Treatment: 42 Wound Status Wound Number: 1 Primary Diabetic Wound/Ulcer of the Lower Extremity Etiology: Wound Location: Right Toe Great Wound Open Wounding Event: Gradually Appeared Status: Date Acquired: 06/11/2016 Comorbid Anemia, Lymphedema, Congestive Heart Weeks Of Treatment: 42 History: Failure, Hypertension, Peripheral Venous Clustered Wound: No Disease, Type II Diabetes, Neuropathy Pending Amputation On Presentation Photos Photo Uploaded By: Montey Hora on 07/18/2017 12:34:59 Wound Measurements Length: (cm) 0.4 Width: (cm) 0.6 Depth: (cm) 0.1 Area: (cm) 0.188 Volume: (cm) 0.019 % Reduction in Area: 99.6% % Reduction in Volume:  99.6% Epithelialization: Small (1-33%) Tunneling: No Undermining: No Wound Description Classification: Grade 2 Wound Margin: Flat and Intact Exudate Amount: Large Exudate Type: Serosanguineous Exudate Color: red, brown Foul Odor After Cleansing: No Slough/Fibrino No Wound Bed Granulation Amount: Large (67-100%) Exposed Structure Granulation Quality: Red Fascia Exposed: No Necrotic Amount: Small (1-33%) Fat Layer (Subcutaneous Tissue) Exposed: Yes Necrotic Quality: Eschar, Adherent Slough Tendon Exposed: No Muscle Exposed: No Joint Exposed: No Bone Exposed: No Periwound Skin Texture Lecuyer, Eduar E. ([id number]) Texture Color No Abnormalities Noted: No No Abnormalities Noted: No Callus: Yes Atrophie Blanche: No Crepitus: No Cyanosis: No Excoriation: Yes Ecchymosis:  No Induration: No Erythema: No Rash: No Hemosiderin Staining: No Scarring: No Mottled: No Pallor: No Moisture Rubor: No No Abnormalities Noted: No Dry / Scaly: No Temperature / Pain Maceration: Yes Temperature: No Abnormality Wound Preparation Ulcer Cleansing: Rinsed/Irrigated with Saline Topical Anesthetic Applied: Other: lidocaine 4%, Electronic Signature(s) Signed: 07/18/2017 3:01:24 PM By: Montey Hora Entered By: Montey Hora on 07/18/2017 08:41:40 Ekstrom, [name] ([id number]) -------------------------------------------------------------------------------- Wound Assessment Details Patient Name: Furgerson, Allen E. Date of Service: 07/18/2017 8:30 AM Medical Record Number: [id number] Patient Account Number: [id number] Date of Birth/Sex: June 27, 1950 (67 y.o. M) Treating RN: Montey Hora Primary Care Meliton Samad: Lamonte Sakai Other Clinician: Referring Orla Estrin: Lamonte Sakai Treating Traeson Dusza/Extender: Cathie Olden in Treatment: 42 Wound Status Wound Number: 2 Primary Diabetic Wound/Ulcer of the Lower Extremity Etiology: Wound Location: Right Toe Second Wound Open Wounding Event:  Gradually Appeared Status: Date Acquired: 06/11/2016 Comorbid Anemia, Lymphedema, Congestive Heart Weeks Of Treatment: 42 History: Failure, Hypertension, Peripheral Venous Clustered Wound: No Disease, Type II Diabetes, Neuropathy Pending Amputation On Presentation Photos Photo Uploaded By: Montey Hora on 07/18/2017 12:35:39 Wound Measurements Length: (cm) 0.6 Width: (cm) 1.4 Depth: (cm) 0.1 Area: (cm) 0.66 Volume: (cm) 0.066 % Reduction in Area: 78.5% % Reduction in Volume: 78.5% Epithelialization: Medium (34-66%) Tunneling: No Undermining: No Wound Description Classification: Grade 2 Wound Margin: Flat and Intact Exudate Amount: Large Exudate Type: Serosanguineous Exudate Color: red, brown Foul Odor After Cleansing: No Slough/Fibrino No Wound Bed Granulation Amount: Large (67-100%) Exposed Structure Granulation Quality: Red Fascia Exposed: No Necrotic Amount: Small (1-33%) Fat Layer (Subcutaneous Tissue) Exposed: Yes Necrotic Quality: Adherent Slough Tendon Exposed: No Muscle Exposed: No Joint Exposed: No Bone Exposed: No Periwound Skin Texture Blocher, Derwood E. ([id number]) Texture Color No Abnormalities Noted: No No Abnormalities Noted: No Callus: No Atrophie Blanche: No Crepitus: No Cyanosis: No Excoriation: No Ecchymosis: No Induration: No Erythema: No Rash: No Hemosiderin Staining: No Scarring: Yes Mottled: No Pallor: No Moisture Rubor: No No Abnormalities Noted: No Dry / Scaly: No Temperature / Pain Maceration: Yes Temperature: No Abnormality Wound Preparation Ulcer Cleansing: Rinsed/Irrigated with Saline Topical Anesthetic Applied: Other: lidocaine 4%, Electronic Signature(s) Signed: 07/18/2017 3:01:24 PM By: Montey Hora Entered By: Montey Hora on 07/18/2017 08:42:05 Rosetti, [name] ([id number]) -------------------------------------------------------------------------------- Wound Assessment Details Patient Name: Minion, Hairo  E. Date of Service: 07/18/2017 8:30 AM Medical Record Number: [id number] Patient Account Number: [id number] Date of Birth/Sex: 04-05-1951 (67 y.o. M) Treating RN: Montey Hora Primary Care Ogechi Kuehnel: Lamonte Sakai Other Clinician: Referring Robinson Brinkley: Lamonte Sakai Treating Bailey Faiella/Extender: Cathie Olden in Treatment: 42 Wound Status Wound Number: 5 Primary Diabetic Wound/Ulcer of the Lower Extremity Etiology: Wound Location: Left Toe Great Wound Open Wounding Event: Gradually Appeared Status: Date Acquired: 06/11/2016 Comorbid Anemia, Lymphedema, Congestive Heart Weeks Of Treatment: 42 History: Failure, Hypertension, Peripheral Venous Clustered Wound: No Disease, Type II Diabetes, Neuropathy Pending Amputation On Presentation Photos Photo Uploaded By: Montey Hora on 07/18/2017 12:35:40 Wound Measurements Length: (cm) 0.8 Width: (cm) 1.9 Depth: (cm) 0.1 Area: (cm) 1.194 Volume: (cm) 0.119 % Reduction in Area: 91.6% % Reduction in Volume: 91.7% Epithelialization: Small (1-33%) Tunneling: No Undermining: No Wound Description Classification: Grade 2 Wound Margin: Flat and Intact Exudate Amount: Large Exudate Type: Serosanguineous Exudate Color: red, brown Foul Odor After Cleansing: Yes Due to Product Use: No Slough/Fibrino No Wound Bed Granulation Amount: Medium (34-66%) Exposed Structure Granulation Quality: Red Fascia Exposed: No Necrotic Amount: Small (1-33%) Fat Layer (Subcutaneous Tissue) Exposed: Yes Necrotic Quality: Adherent Slough Tendon Exposed: No Muscle Exposed: No  Joint Exposed: No Bone Exposed: No Periwound Skin Texture Tippen, Tyeler E. ([id number]) Texture Color No Abnormalities Noted: No No Abnormalities Noted: No Callus: No Atrophie Blanche: No Crepitus: No Cyanosis: No Excoriation: No Ecchymosis: No Induration: No Erythema: No Rash: No Hemosiderin Staining: No Scarring: No Mottled: No Pallor: No Moisture Rubor: No No  Abnormalities Noted: No Dry / Scaly: No Temperature / Pain Maceration: Yes Temperature: No Abnormality Wound Preparation Ulcer Cleansing: Rinsed/Irrigated with Saline Topical Anesthetic Applied: Other: lidocaine 4%, Electronic Signature(s) Signed: 07/18/2017 3:01:24 PM By: Montey Hora Entered By: Montey Hora on 07/18/2017 08:42:51 Adrian, [name] ([id number]) -------------------------------------------------------------------------------- Wound Assessment Details Patient Name: Vantil, Xzaiver E. Date of Service: 07/18/2017 8:30 AM Medical Record Number: [id number] Patient Account Number: [id number] Date of Birth/Sex: 19-Apr-1950 (67 y.o. M) Treating RN: Montey Hora Primary Care Riad Wagley: Lamonte Sakai Other Clinician: Referring Dylan Ruotolo: Lamonte Sakai Treating Akera Snowberger/Extender: Cathie Olden in Treatment: 42 Wound Status Wound Number: 6 Primary Diabetic Wound/Ulcer of the Lower Extremity Etiology: Wound Location: Left Toe Second Wound Open Wounding Event: Gradually Appeared Status: Date Acquired: 06/11/2016 Comorbid Anemia, Lymphedema, Congestive Heart Weeks Of Treatment: 42 History: Failure, Hypertension, Peripheral Venous Clustered Wound: No Disease, Type II Diabetes, Neuropathy Pending Amputation On Presentation Photos Photo Uploaded By: Montey Hora on 07/18/2017 12:36:22 Wound Measurements Length: (cm) 0.5 Width: (cm) 0.7 Depth: (cm) 0.1 Area: (cm) 0.275 Volume: (cm) 0.027 % Reduction in Area: 87.1% % Reduction in Volume: 87.4% Epithelialization: Small (1-33%) Tunneling: No Undermining: No Wound Description Classification: Grade 2 Wound Margin: Flat and Intact Exudate Amount: Large Exudate Type: Serosanguineous Exudate Color: red, brown Foul Odor After Cleansing: No Slough/Fibrino No Wound Bed Granulation Amount: Large (67-100%) Exposed Structure Granulation Quality: Red, Hyper-granulation Fascia Exposed: No Necrotic Amount: Small  (1-33%) Fat Layer (Subcutaneous Tissue) Exposed: Yes Necrotic Quality: Adherent Slough Tendon Exposed: No Muscle Exposed: No Joint Exposed: No Bone Exposed: No Periwound Skin Texture Garno, Nyzier E. ([id number]) Texture Color No Abnormalities Noted: No No Abnormalities Noted: No Callus: No Atrophie Blanche: No Crepitus: No Cyanosis: No Excoriation: No Ecchymosis: No Induration: No Erythema: No Rash: No Hemosiderin Staining: No Scarring: No Mottled: No Pallor: No Moisture Rubor: No No Abnormalities Noted: No Dry / Scaly: No Temperature / Pain Maceration: Yes Temperature: No Abnormality Wound Preparation Ulcer Cleansing: Rinsed/Irrigated with Saline Topical Anesthetic Applied: Other: lidocaine 4%, Electronic Signature(s) Signed: 07/18/2017 3:01:24 PM By: Montey Hora Entered By: Montey Hora on 07/18/2017 08:43:34 Guallpa, [name] ([id number]) -------------------------------------------------------------------------------- Vitals Details Patient Name: Felipe, Albion E. Date of Service: 07/18/2017 8:30 AM Medical Record Number: [id number] Patient Account Number: [id number] Date of Birth/Sex: 1950-06-03 (67 y.o. M) Treating RN: Roger Shelter Primary Care Esaiah Wanless: Lamonte Sakai Other Clinician: Referring Gissell Barra: Lamonte Sakai Treating Joclyn Alsobrook/Extender: Cathie Olden in Treatment: 42 Vital Signs Time Taken: 08:36 Temperature (F): 97.7 Height (in): 69 Pulse (bpm): 66 Weight (lbs): 168 Respiratory Rate (breaths/min): 16 Body Mass Index (BMI): 24.8 Blood Pressure (mmHg): 136/81 Reference Range: 80 - 120 mg / dl Electronic Signature(s) Signed: 07/19/2017 4:04:12 PM By: Roger Shelter Entered By: Roger Shelter on 07/18/2017 08:37:07

## 2017-07-26 NOTE — Progress Notes (Signed)
GIANCARLOS, BERENDT ([id number]) Visit Report for 07/18/2017 Chief Complaint Document Details Patient Name: Greff, DONTAVIOUS EMILY. Date of Service: 07/18/2017 8:30 AM Medical Record Number: [id number] Patient Account Number: [id number] Date of Birth/Sex: 08-31-1950 (67 y.o. M) Treating RN: Ahmed Prima Primary Care Provider: Lamonte Sakai Other Clinician: Referring Provider: Lamonte Sakai Treating Provider/Extender: Cathie Olden in Treatment: 58 Information Obtained from: Patient Chief Complaint Patient presents for treatment of an open diabetic ulcer to both feet Electronic Signature(s) Signed: 07/18/2017 9:27:29 AM By: Lawanda Cousins Entered By: Lawanda Cousins on 07/18/2017 09:27:29 Steeves, [name] ([id number]) -------------------------------------------------------------------------------- Debridement Details Patient Name: Dalal, Vedant E. Date of Service: 07/18/2017 8:30 AM Medical Record Number: [id number] Patient Account Number: [id number] Date of Birth/Sex: [birth year] (67 y.o. M) Treating RN: Ahmed Prima Primary Care Provider: Lamonte Sakai Other Clinician: Referring Provider: Lamonte Sakai Treating Provider/Extender: Cathie Olden in Treatment: 42 Debridement Performed for Wound #1 Right Toe Great Assessment: Performed By: Physician Lawanda Cousins, NP Debridement Type: Debridement Severity of Tissue Pre Fat layer exposed Debridement: Pre-procedure Verification/Time Yes - 08:57 Out Taken: Start Time: 09:01 Pain Control: Lidocaine 4% Topical Solution Total Area Debrided (L x W): 0.4 (cm) x 0.6 (cm) = 0.24 (cm) Tissue and other material Non-Viable, Callus, Slough, Subcutaneous, Fibrin/Exudate, Slough debrided: Level: Skin/Subcutaneous Tissue Debridement Description: Excisional Instrument: Curette Bleeding: Minimum Hemostasis Achieved: Pressure End Time: 09:03 Procedural Pain: 0 Post Procedural Pain: 0 Response to Treatment: Procedure was tolerated well Post  Debridement Measurements of Total Wound Length: (cm) 0.4 Width: (cm) 0.6 Depth: (cm) 0.2 Volume: (cm) 0.038 Character of Wound/Ulcer Post Debridement: Requires Further Debridement Severity of Tissue Post Debridement: Fat layer exposed Post Procedure Diagnosis Same as Pre-procedure Electronic Signature(s) Signed: 07/18/2017 9:23:52 AM By: Lawanda Cousins Signed: 07/22/2017 4:22:27 PM By: Alric Quan Entered By: Lawanda Cousins on 07/18/2017 09:23:52 Luiz, [name] ([id number]) -------------------------------------------------------------------------------- Debridement Details Patient Name: Mochizuki, Lonie E. Date of Service: 07/18/2017 8:30 AM Medical Record Number: [id number] Patient Account Number: [id number] Date of Birth/Sex: 06/19/1950 (67 y.o. M) Treating RN: Ahmed Prima Primary Care Provider: Lamonte Sakai Other Clinician: Referring Provider: Lamonte Sakai Treating Provider/Extender: Cathie Olden in Treatment: 42 Debridement Performed for Wound #2 Right Toe Second Assessment: Performed By: Physician Lawanda Cousins, NP Debridement Type: Debridement Severity of Tissue Pre Fat layer exposed Debridement: Pre-procedure Verification/Time Yes - 08:57 Out Taken: Start Time: 09:03 Pain Control: Lidocaine 4% Topical Solution Total Area Debrided (L x W): 0.6 (cm) x 1.4 (cm) = 0.84 (cm) Tissue and other material Viable, Non-Viable, Skin: Dermis , Skin: Epidermis debrided: Level: Skin/Epidermis Debridement Description: Selective/Open Wound Instrument: Curette Bleeding: Minimum Hemostasis Achieved: Pressure End Time: 09:05 Procedural Pain: 0 Post Procedural Pain: 0 Response to Treatment: Procedure was tolerated well Post Debridement Measurements of Total Wound Length: (cm) 0.6 Width: (cm) 1.4 Depth: (cm) 0.2 Volume: (cm) 0.132 Character of Wound/Ulcer Post Debridement: Requires Further Debridement Severity of Tissue Post Debridement: Fat layer exposed Post  Procedure Diagnosis Same as Pre-procedure Electronic Signature(s) Signed: 07/18/2017 9:26:27 AM By: Lawanda Cousins Signed: 07/22/2017 4:22:27 PM By: Alric Quan Entered By: Lawanda Cousins on 07/18/2017 09:26:27 Louthan, [name] ([id number]) -------------------------------------------------------------------------------- Debridement Details Patient Name: Haberland, Mabel E. Date of Service: 07/18/2017 8:30 AM Medical Record Number: [id number] Patient Account Number: [id number] Date of Birth/Sex: Apr 19, 1950 (67 y.o. M) Treating RN: Ahmed Prima Primary Care Provider: Lamonte Sakai Other Clinician: Referring Provider: Lamonte Sakai Treating Provider/Extender: Cathie Olden in Treatment: 42 Debridement Performed for Wound #5 Left Toe Great Assessment: Performed By: Physician Lawanda Cousins,  NP Debridement Type: Debridement Severity of Tissue Pre Fat layer exposed Debridement: Pre-procedure Verification/Time Yes - 08:57 Out Taken: Start Time: 08:59 Pain Control: Lidocaine 4% Topical Solution Total Area Debrided (L x W): 0.8 (cm) x 1.9 (cm) = 1.52 (cm) Tissue and other material Viable, Non-Viable, Slough, Skin: Dermis , Fibrin/Exudate, Slough debrided: Level: Skin/Dermis Debridement Description: Selective/Open Wound Instrument: Curette Bleeding: Minimum Hemostasis Achieved: Pressure End Time: 09:01 Procedural Pain: 0 Post Procedural Pain: 0 Response to Treatment: Procedure was tolerated well Post Debridement Measurements of Total Wound Length: (cm) 0.8 Width: (cm) 1.9 Depth: (cm) 0.2 Volume: (cm) 0.239 Character of Wound/Ulcer Post Debridement: Requires Further Debridement Severity of Tissue Post Debridement: Fat layer exposed Post Procedure Diagnosis Same as Pre-procedure Electronic Signature(s) Signed: 07/18/2017 9:26:59 AM By: Lawanda Cousins Signed: 07/22/2017 4:22:27 PM By: Alric Quan Entered By: Lawanda Cousins on 07/18/2017 09:26:59 Ertle, [name]  ([id number]) -------------------------------------------------------------------------------- Debridement Details Patient Name: Sorey, Ashawn E. Date of Service: 07/18/2017 8:30 AM Medical Record Number: [id number] Patient Account Number: [id number] Date of Birth/Sex: [birth year] (67 y.o. M) Treating RN: Ahmed Prima Primary Care Provider: Lamonte Sakai Other Clinician: Referring Provider: Lamonte Sakai Treating Provider/Extender: Cathie Olden in Treatment: 42 Debridement Performed for Wound #6 Left Toe Second Assessment: Performed By: Physician Lawanda Cousins, NP Debridement Type: Debridement Severity of Tissue Pre Fat layer exposed Debridement: Pre-procedure Verification/Time Yes - 08:57 Out Taken: Start Time: 08:58 Pain Control: Lidocaine 4% Topical Solution Total Area Debrided (L x W): 0.5 (cm) x 0.7 (cm) = 0.35 (cm) Tissue and other material Viable, Non-Viable, Slough, Subcutaneous, Fibrin/Exudate, Slough, Hyper-granulation debrided: Level: Skin/Subcutaneous Tissue Debridement Description: Excisional Instrument: Curette Bleeding: Minimum Hemostasis Achieved: Pressure End Time: 08:59 Procedural Pain: 0 Post Procedural Pain: 0 Response to Treatment: Procedure was tolerated well Post Debridement Measurements of Total Wound Length: (cm) 0.5 Width: (cm) 0.7 Depth: (cm) 0.2 Volume: (cm) 0.055 Character of Wound/Ulcer Post Debridement: Requires Further Debridement Severity of Tissue Post Debridement: Fat layer exposed Post Procedure Diagnosis Same as Pre-procedure Electronic Signature(s) Signed: 07/18/2017 9:27:19 AM By: Lawanda Cousins Signed: 07/22/2017 4:22:27 PM By: Alric Quan Entered By: Lawanda Cousins on 07/18/2017 09:27:19 Luera, [name] ([id number]) -------------------------------------------------------------------------------- HPI Details Patient Name: Penninger, Keo E. Date of Service: 07/18/2017 8:30 AM Medical Record Number: [id number] Patient  Account Number: [id number] Date of Birth/Sex: 03-20-1951 (67 y.o. M) Treating RN: Ahmed Prima Primary Care Provider: Lamonte Sakai Other Clinician: Referring Provider: Lamonte Sakai Treating Provider/Extender: Cathie Olden in Treatment: 50 History of Present Illness Location: bilateral feet ulceration on the toes Quality: Patient reports experiencing a dull pain to affected area(s). Severity: Patient states wound are getting better Duration: Patient has had the wound for > 3 months prior to seeking treatment at the wound center Timing: Pain in wound is constant (hurts all the time) Context: The wound would happen gradually Modifying Factors: Other treatment(s) tried include:treatment for lymphedema and is seen by the podiatrist Dr. Caryl Comes Associated Signs and Symptoms: Patient reports having increase swelling. HPI Description: 67 year old patient here to see as for bilateral feet ulceration to on his left first and second toe and 2 on his right first and second toe, which she's had for about 4 months. 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 Donella Stade. The patient is also undergoing workup by medical oncology for a intra-abdominal lymphadenopathy. 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 He is also status post appendectomy, inguinal lymph node biopsy, rectal biopsy and rectal examination under anesthesia. he currently smokes cigarettes about half packet a day. In March of this year he was seen by Dr. Hortencia Pilar, for evaluation of bilateral varicose veins and besides wearing compression stockings he had recommended laser ablation of the right and left great saphenous veins to eleviate the symptoms and complications of severe  superficial venous reflux disease. He also recommended lymphedema pumps for better control of his lymphedema. The patient recently has had on 08/23/2016, right greater saphenous vein ablation with the laser energy Earlier lower extremity venous reflux examination done on 05/08/2016 showed no DVT or SVT both lower legs but incompetence of bilateral great saphenous veins was present. A lower arterial study was also done and there was no significant right lower and left lower extremity problems based on a normal toe brachial index bilaterally and the ABI was 1.21 the left and 1.23 on the right. His post ablation venous duplex examination showed successful ablation of the right GS vein with thrombus formation 2 below the right saphenofemoral junction. The deep system was patent without evidence of thrombosis and this was done on 08/30/2016. 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 Addendum: regarding his x-rays done today and x-ray of the left foot -- IMPRESSION: No objective evidence of osteomyelitis. There are soft tissue changes which may reflect cellulitis. X-ray of the right foot -- IMPRESSION:Findings compatible with cellulitis of the toes. No objective evidence of osteomyelitis is observed. 10/01/16 on evaluation today patient's wounds appeared to be doing some better. I did review the x-rays as well which showed no evidence of osteomyelitis although there was evidence on x-ray of cellulitis. He fortunately is not having any discomfort although he continues to have some swelling. He does not remember being on any antibiotics recently. 10/15/16 on evaluation today patient's wounds overall appear to be doing better although he does have a new location noted on the left foot. Fortunately he is not having significant pain. It almost has the appearance that something is rubbing on the  end of his toes but he wears the open toe shoes and according to what he is telling me never wears anything that would rub on his foot. There is no evidence of infection and specifically no evidence of a fungal infection 10/22/16 On evaluation today patient's wounds appeared to be doing better compared to last week in regard to his bilateral Channell, Jobe E. ([id number]) lower extremities. Fortunately I happy with how things are progressing although he still has ulcers I feel like that he is improving and appropriate manner. 11/12/16 on evaluation today patient appears to be doing well in regard to his bilateral feet and the respective wounds. We have been using surrounding her dressings along with an antifungal cream which seems to be doing very well. He has no bilateral dysfunction noticed that the rituals are weight loss at this point. He also has no nausea or vomiting a note purulent discharge. He did see Vein and vascular today and he tells me that they told him he could have surgery for his venous stasis but they did not feel like it was worth it in his words. Fortunately patient's wounds do appear to be getting sneakily better. 11/26/2016 -- he says he  is going to have some surgery during this week at [facility] for possibly a colon resection. 12/31/2016 -- the patient has been noncompliant with his smoking and I'm not sure whether he is also started drinking again. He continues to be very nonchalant about his care 01/14/2017 -- the patient's HandP has been reviewed well and I understand he is being compliant with trying to give up smoking and his local dressing changes. He does not have any surgical options of 4 to him by his vascular surgeons.he was last seen in early August by Dr. Hortencia Pilar who recommended compression stockings,and possibly lymph pumps in 2-3 months after doing a review ultrasound. 01/28/2017 - the patient did not have any fresh complaints but on examination I noted  a large lacerated wound on the plantar aspect of his right fourth toe which had a lot of necrotic debris and it probes down to bone. 02/07/2017 -- x-ray of the right foot -- IMPRESSION: Soft tissue swelling about the first through fourth toes consistent with cellulitis. New destructive change in the tuft of the distal phalanx of the great toe is consistent with osteomyelitis. 02/14/2017 -- the patient's MRI is pending this coming Monday and he still continues to smoke. We have again gone over off loading of his wounds in great detail and he says he's been compliant. 02/21/2017 -- MR of the right foot -- IMPRESSION: 1. Soft tissue ulcer at the tip of the first, second and third toe knows. Cortical irregularity and bone marrow edema in the first distal phalanx most concerning for osteomyelitis. Mild marrow edema in the second and third distal phalanx without definite cortical destruction which may reflect early osteomyelitis versus reactive marrow edema. 2. Soft tissue edema surrounding the first phalanx most consistent with cellulitis. the patient was also recently evaluated by his medical oncologist Dr. 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 She is treating him with weekly Procrit. She is also keeping intra-abdominal lymphadenopathy and right lower lobe lung nodule under observation. 04/04/2017 -- he was seen by Dr. Adrian Prows on 03/25/2017 -- after review he empirically put him on ciprofloxacin and doxycycline as they have good bone penetration and good bioavailability and it will cover the usual pathogens and diabetic foot osteomyelitis. He will check inflammatory markers and plan a 8-48 week old records. C-reactive protein was 0.3 and the ESR was 72 04/18/17 on evaluation today patient appears to be doing about the same in regard to his lower extremity wounds bilaterally. He has continued to use the antifungal cream  which does seem to be beneficial. Nonetheless the ulcers do seem to in some areas be epithelial eyes over and in other areas are still open. He is having no significant discomfort. 04/25/17-he is here in follow-up evaluation for multiple ulcerations to multiple toes bilaterally. He states he did see Dr. Ola Spurr again last week and continues antibiotic therapy. He is voicing no complaints or concerns, will continue with current treatment plan will possibility of adding compression therapy next week after an additional week of treatment/lotions to BLE prescribed by Dr Ola Spurr 05/02/17 he is here in follow up for for multiple ulcers to multiple toes bilaterally. we will stop using antifungal cream and will continue with silvercel and follow up next week 05/09/17-he is here in follow-up for multiple ulcerations to multiple toes bilaterally. There is improvement in appearance. He has not completely stopped using antifungal cream, but admits he has not  using it between the toes. He has an appointment with Dr. Ola Spurr on 2/11, continues on doxycycline and Cipro. It has been 5 weeks of antibiotic therapy, we will order plain film xray to evaluate for osteomyelitis next week, prior to follow up with ID. Will continue with silvercel and follow up next week 05/16/17-he is here in follow-up evaluation for multiple ulcerations to multiple toes bilaterally and new wound to the right posterior heel. There is essentially no change in appearance, deteriorating measurements; he has a history of waxing and waning measurements. He admits that he continues to apply moisturizer/cream/ointment to his toes despite weekly reminders to only apply silvercel to his toes. He states that he thinks the surgical shoe contributed to the superficial ulcer to his posterior Edmundson, Atharv E. ([id number]) heel, he is unable to articulate if this was an area of dry cracked skin as he has a similar area to the left heel. He now  is wearing open toed slippers. He has an appointment with Dr. Ola Spurr on 2/11. We have ordered x-rays for her bilateral feet; he was advised to obtain the x-rays today or tomorrow. He will follow-up next week 05/23/17-he is here in follow-up evaluation for multiple ulcerations to multiple toes bilaterally and the right posterior heel. There is improvement in maceration. He has been compliant and not applying any moisturizing agent to his toes. He has been using Lac-Hydrin for his lower extremities with improvement. He did not go to his appointment on Monday with Dr. Ola Spurr secondary to financial concerns. X-rays for her bilateral feet showed: LEFT FOOT with slight erosion of the tuft of the distal phalanges of the left first and second toe suspicious for osteomyelitis, RIGHT FOOT with 1.erosion of the tufts of the distal phalanges of the right first second and possibly third toes consistent with osteomyelitis, 2 no definitive abnormality of the calcaneus is seen on the images obtained, 3. Plantar calcaneal degenerative spur. We briefly discussed hyperbaric adjunctive therapy for treatment of chronic refractory osteomyelitis. I do not find an a1c in EMR, will contact PCP for record, or order if needed. He has been encouraged to contact Dr Ola Spurr office regarding the follow-up appointment, encouraged him to inquire about payment plan. We will continue with same treatment plan and follow-up next week. He states he is still taking antibiotics and has "a lot" left. He states he has been taking them as directed, 2 pills twice daily. According to Dr. Blane Ohara office notes he was originally started on 12/17 for 4 weeks and extended on 1/14 for an additional 4 weeks. He should be done with his antibiotic therapy, he was advised to bring his bottles and to his next appointment, we will contact pharmacy. 05/30/17-he is here in follow-up evaluation for multiple ulcerations to multiple toes  bilaterally and the right posterior heel. He is accompanied by his brother-in-law. Wounds are stable. He has yet to make up with Dr. Ola Spurr. We contacted his PCP, with no record of recent A1c we will draw an A1c. His brother-in-law states that he was taken off all of his diabetic medication secondary to kidney function. 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 The antibiotics were counted and have approximately 2 weeks left, although they should be complete. The pill organizer reveals missing days. We discussed the need for consistent medications, to have optimal benefit of medication. He has a cousin that lives with him and he will ask  her to check his organizer daily. He has been advised to follow up with Dr Ola Spurr, and will go by the office today. He has been advised to quit smoking. 06/06/17-he is here in follow up evaluation. He has had to make an appointment with Dr. Ola Spurr. He did have blood work obtained, a1c 5. He continues to take antibiotic therapy. Significant improvement in bilateral lower extremity edema with compression therapy. Essentially no change in ulcerations to toes. He states he is "going to try something different" and "let me know next week" if it works; he would not provide any additional information and was encouraged to follow our orders. We will follow up next week 06/13/17-he is here in follow-up evaluation. He has an appointment with Dr. Ola Spurr tomorrow morning. He states he purchased an ointment from [facility] and applied to his toes for 3 days, he does not remember the name of the ointment. There is improvement to his wounds, minimal maceration. He continues to take antibiotic therapy, this should have been completed last month. His brother-in-law who regularly accompanies his appointments was asked to take the bottles to the appointment tomorrow with Dr.  Ola Spurr so he is aware. We will continue with 3 layer compression, and order OPEN TOE compression 20-30mmHg; we will apply compression stockings next week. He continues to smoke, smoked "2 cigarettes" last week 06/20/17 on evaluation today patient did receive his compression stockings which he has with him today for both lower extremities. With that being said he tells me at this point in time that he is very happy to have these he really is not a big fan of the compression wraps that we have been utilizing although they have been of great benefit for him. Nonetheless at this point he does want to switch to the compression stockings. In my opinion as long as he is continuing with compression I'm okay with the stockings or the wraps. 06/27/17-he is here in follow-up evaluation for multiple ulcerations to his bilateral toes. There is some improvement in appearance. He is compliant in wearing his compression stockings with significant improvement in lower extremity edema. He saw Dr Ola Spurr on 3/8, per his notes they would redraw ESR and CRP; plan to continue antibiotic therapy if these remain elevated. I do not see an ESR or CRP level in Epic. The patient continues to take antibiotics. 07/11/17-He is here in follow-up evaluation for multiple ulcerations to multiple toes bilaterally. He presents with complete epithelialization to the right third toe; there has been no deterioration. He continues on antibiotic therapy. He will follow-up next week 07/18/17-He is here in follow-up evaluation for multiple ulcerations to multiple toes bilaterally. He continues to make improvement. He continues on antibiotic therapy. He states he has been using something additional to our orders, he does not elaborate but states he will bring it in next week. Electronic Signature(s) Signed: 07/18/2017 9:28:48 AM By: Lawanda Cousins Entered By: Lawanda Cousins on 07/18/2017 09:28:48 KASTIEL, SIMONIAN ([id number]) Maryjo Rochester, [name]  ([id number]) -------------------------------------------------------------------------------- Physician Orders Details Patient Name: Breth, Marcos E. Date of Service: 07/18/2017 8:30 AM Medical Record Number: [id number] Patient Account Number: [id number] Date of Birth/Sex: 1950-08-08 (67 y.o. M) Treating RN: Ahmed Prima Primary Care Provider: Lamonte Sakai Other Clinician: Referring Provider: Lamonte Sakai Treating Provider/Extender: Cathie Olden in Treatment: 20 Verbal / Phone Orders: Yes Clinician: Carolyne Fiscal, Debi Read Back and Verified: Yes Diagnosis Coding Wound Cleansing Wound #1 Right Toe Great o Clean wound with Normal Saline. o May Shower, gently pat wound  dry prior to applying new dressing. Wound #2 Right Toe Second o Clean wound with Normal Saline. o May Shower, gently pat wound dry prior to applying new dressing. Wound #5 Left Toe Great o Clean wound with Normal Saline. o May Shower, gently pat wound dry prior to applying new dressing. Wound #6 Left Toe Second o Clean wound with Normal Saline. o May Shower, gently pat wound dry prior to applying new dressing. Anesthetic (add to Medication List) Wound #1 Right Toe Great o Topical Lidocaine 4% cream applied to wound bed prior to debridement (In Clinic Only). Wound #2 Right Toe Second o Topical Lidocaine 4% cream applied to wound bed prior to debridement (In Clinic Only). Wound #5 Left Toe Great o Topical Lidocaine 4% cream applied to wound bed prior to debridement (In Clinic Only). Wound #6 Left Toe Second o Topical Lidocaine 4% cream applied to wound bed prior to debridement (In Clinic Only). Primary Wound Dressing Wound #1 Right Toe Great o Silver Alginate - silvercel Wound #2 Right Toe Second o Silver Alginate - silvercel Wound #5 Left Toe Great o Silver Alginate - silvercel Wound #6 Left Toe Second o Silver Alginate - silvercel Secondary Dressing Pounds, Dyllin E.  ([id number]) Wound #1 Right Toe Great o ABD and Kerlix/Conform Wound #2 Right Toe Second o ABD and Kerlix/Conform Wound #5 Left Toe Great o ABD and Kerlix/Conform Wound #6 Left Toe Second o ABD and Kerlix/Conform Dressing Change Frequency Wound #1 Right Toe Great o Change dressing every day. Wound #2 Right Toe Second o Change dressing every day. Wound #5 Left Toe Great o Change dressing every day. Wound #6 Left Toe Second o Change dressing every day. Follow-up Appointments Wound #1 Right Toe Great o Return Appointment in 1 week. Wound #2 Right Toe Second o Return Appointment in 1 week. Wound #5 Left Toe Great o Return Appointment in 1 week. Wound #6 Left Toe Second o Return Appointment in 1 week. Edema Control o Patient to wear own compression stockings o Elevate legs to the level of the heart and pump ankles as often as possible Additional Orders / Instructions Wound #1 Right Toe Great o Stop Smoking o Increase protein intake. o Other: - Please add vitamin A, vitamin C and zinc supplements to your diet Wound #2 Right Toe Second o Stop Smoking o Increase protein intake. o Other: - Please add vitamin A, vitamin C and zinc supplements to your diet Wound #5 Left Toe Great o Stop Smoking o Increase protein intake. QUARAN, KEDZIERSKI ([id number]) o Other: - Please add vitamin A, vitamin C and zinc supplements to your diet Wound #6 Left Toe Second o Stop Smoking o Increase protein intake. o Other: - Please add vitamin A, vitamin C and zinc supplements to your diet Patient Medications Allergies: No Known Drug Allergies Notifications Medication Indication Start End lidocaine DOSE 1 - topical 4 % cream - 1 cream topical Electronic Signature(s) Signed: 07/18/2017 3:26:23 PM By: Lawanda Cousins Signed: 07/22/2017 4:22:27 PM By: Alric Quan Entered By: Alric Quan on 07/18/2017 09:06:29 Lun, [name]  ([id number]) -------------------------------------------------------------------------------- Prescription 07/18/2017 Patient Name: [name] Provider: Lawanda Cousins NP Date of Birth: 08-23-1950 NPI#: [id number] Sex: Jerilynn Mages DEA#: [id number] Phone #: [phone] License #: Patient Address: [facility] [place], [place] [address] [address], [place], [place] [address] [phone] Allergies No Known Drug Allergies Medication Medication: Route: Strength: Form: lidocaine topical 4% cream Class: TOPICAL LOCAL  ANESTHETICS Dose: Frequency / Time: Indication: 1 1 cream topical Number of Refills: Number of Units: 0 Generic Substitution: Start Date: End Date: Administered at [facility]: Yes Time Administered: Time Discontinued: Note to Pharmacy: Signature(s): Date(s): Electronic Signature(s) Signed: 07/18/2017 3:26:23 PM By: Lawanda Cousins Signed: 07/22/2017 4:22:27 PM By: Alric Quan Entered By: Alric Quan on 07/18/2017 09:06:30 Holmer, AWAD GLADD ([id number]) Juniel, Dontel E. ([id number]) --------------------------------------------------------------------------------  Problem List Details Patient Name: Lupi, Nadim E. Date of Service: 07/18/2017 8:30 AM Medical Record Number: [id number] Patient Account Number: [id number] Date of Birth/Sex: 15-Apr-1950 (67 y.o. M) Treating RN: Ahmed Prima Primary Care Provider: Lamonte Sakai Other Clinician: Referring Provider: Lamonte Sakai Treating Provider/Extender: Cathie Olden in Treatment: 23 Active Problems ICD-10 Impacting Encounter Code Description Active Date Wound Healing Diagnosis E11.621 Type 2 diabetes mellitus with foot ulcer 09/21/2016 Yes I87.313 Chronic venous hypertension (idiopathic) with ulcer of 09/21/2016 Yes bilateral lower extremity I89.0 Lymphedema, not elsewhere classified 09/21/2016  Yes L97.522 Non-pressure chronic ulcer of other part of left foot with fat 09/21/2016 Yes layer exposed L97.512 Non-pressure chronic ulcer of other part of right foot with fat 09/21/2016 Yes layer exposed F17.218 Nicotine dependence, cigarettes, with other nicotine-induced 09/21/2016 Yes disorders F10.19 Alcohol abuse with unspecified alcohol-induced disorder 09/21/2016 Yes M86.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, right ankle and foot 02/21/2017 Yes Inactive Problems Resolved Problems Electronic Signature(s) MEKO, MASTERSON ([id number]) Signed: 07/18/2017 9:22:17 AM By: Lawanda Cousins Entered By: Lawanda Cousins on 07/18/2017 09:22:16 Sime, [name] ([id number]) -------------------------------------------------------------------------------- Progress Note Details Patient Name: Marts, Mardy E. Date of Service: 07/18/2017 8:30 AM Medical Record Number: [id number] Patient Account Number: [id number] Date of Birth/Sex: 04/24/1950 (67 y.o. M) Treating RN: Ahmed Prima Primary Care Provider: Lamonte Sakai Other Clinician: Referring Provider: Lamonte Sakai Treating Provider/Extender: Cathie Olden in Treatment: 30 Subjective Chief Complaint Information obtained from Patient Patient presents for treatment of an open diabetic ulcer to both feet History of Present Illness (HPI) The following HPI elements were documented for the patient's wound: Location: bilateral feet ulceration on the toes Quality: Patient reports experiencing a dull pain to affected area(s). Severity: Patient states wound are getting better Duration: Patient has had the wound for > 3 months prior to seeking treatment at the wound center Timing: Pain in wound is constant (hurts all the time) Context: The wound would happen gradually Modifying Factors: Other treatment(s) tried include:treatment for lymphedema and is seen by the podiatrist Dr. Caryl Comes Associated Signs and Symptoms: Patient reports having increase  swelling. 67 year old patient here to see as for bilateral feet ulceration to on his left first and second toe and 2 on his right first and second toe, which she's had for about 4 months. 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 Donella Stade. The patient is also undergoing workup by medical oncology for a intra-abdominal lymphadenopathy. 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 He is also status post appendectomy, inguinal lymph node biopsy, rectal biopsy and rectal examination under anesthesia. he currently smokes cigarettes about half packet a day. In March of this year he was seen by Dr. Hortencia Pilar, for evaluation of bilateral varicose veins and besides wearing compression stockings he had recommended laser ablation of the right and left great saphenous veins to eleviate the symptoms and complications of severe superficial  venous reflux disease. He also recommended lymphedema pumps for better control of his lymphedema. The patient recently has had on 08/23/2016, right greater saphenous vein ablation with the laser energy Earlier lower extremity venous reflux examination done on 05/08/2016 showed no DVT or SVT both lower legs but incompetence of bilateral great saphenous veins was present. A lower arterial study was also done and there was no significant right lower and left lower extremity problems based on a normal toe brachial index bilaterally and the ABI was 1.21 the left and 1.23 on the right. His post ablation venous duplex examination showed successful ablation of the right GS vein with thrombus formation 2 below the right saphenofemoral junction. The deep system was patent without evidence of thrombosis and this was done  on 08/30/2016. 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 Addendum: regarding his x-rays done today and x-ray of the left foot -- IMPRESSION: No objective evidence of osteomyelitis. There are soft tissue changes which may reflect cellulitis. X-ray of the right foot -- IMPRESSION:Findings compatible with cellulitis of the toes. No objective evidence of osteomyelitis is observed. 10/01/16 on evaluation today patient's wounds appeared to be doing some better. I did review the x-rays as well which showed no evidence of osteomyelitis although there was evidence on x-ray of cellulitis. He fortunately is not having any discomfort Giannelli, Jivan E. ([id number]) although he continues to have some swelling. He does not remember being on any antibiotics recently. 10/15/16 on evaluation today patient's wounds overall appear to be doing better although he does have a new location noted on the left foot. Fortunately he is not having significant pain. It almost has the appearance that something is rubbing on the end of his toes but he wears the open toe shoes and according to what he is telling me never wears anything that would rub on his foot. There is no evidence of infection and specifically no evidence of a fungal infection 10/22/16 On evaluation today patient's wounds appeared to be doing better compared to last week in regard to his bilateral lower extremities. Fortunately I happy with how things are progressing although he still has ulcers I feel like that he is improving and appropriate manner. 11/12/16 on evaluation today patient appears to be doing well in regard to his bilateral feet and the respective wounds. We have been using surrounding her dressings along with an antifungal cream which seems to be doing very well. He has no bilateral dysfunction noticed that the rituals are weight loss at this  point. He also has no nausea or vomiting a note purulent discharge. He did see Vein and vascular today and he tells me that they told him he could have surgery for his venous stasis but they did not feel like it was worth it in his words. Fortunately patient's wounds do appear to be getting sneakily better. 11/26/2016 -- he says he is going to have some surgery during this week at [facility] for possibly a colon resection. 12/31/2016 -- the patient has been noncompliant with his smoking and I'm not sure whether he is also started drinking again. He continues to be very nonchalant about his care 01/14/2017 -- the patient's HandP has been reviewed well and I understand he is being compliant with trying to give up smoking and his local dressing changes. He does not have any surgical options of 4 to him by  his vascular surgeons.he was last seen in early August by Dr. Hortencia Pilar who recommended compression stockings,and possibly lymph pumps in 2-3 months after doing a review ultrasound. 01/28/2017 - the patient did not have any fresh complaints but on examination I noted a large lacerated wound on the plantar aspect of his right fourth toe which had a lot of necrotic debris and it probes down to bone. 02/07/2017 -- x-ray of the right foot -- IMPRESSION: Soft tissue swelling about the first through fourth toes consistent with cellulitis. New destructive change in the tuft of the distal phalanx of the great toe is consistent with osteomyelitis. 02/14/2017 -- the patient's MRI is pending this coming Monday and he still continues to smoke. We have again gone over off loading of his wounds in great detail and he says he's been compliant. 02/21/2017 -- MR of the right foot -- IMPRESSION: 1. Soft tissue ulcer at the tip of the first, second and third toe knows. Cortical irregularity and bone marrow edema in the first distal phalanx most concerning for osteomyelitis. Mild marrow edema in the second and  third distal phalanx without definite cortical destruction which may reflect early osteomyelitis versus reactive marrow edema. 2. Soft tissue edema surrounding the first phalanx most consistent with cellulitis. the patient was also recently evaluated by his medical oncologist Dr. 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 She is treating him with weekly Procrit. She is also keeping intra-abdominal lymphadenopathy and right lower lobe lung nodule under observation. 04/04/2017 -- he was seen by Dr. Adrian Prows on 03/25/2017 -- after review he empirically put him on ciprofloxacin and doxycycline as they have good bone penetration and good bioavailability and it will cover the usual pathogens and diabetic foot osteomyelitis. He will check inflammatory markers and plan a 63-4 week old records. C-reactive protein was 0.3 and the ESR was 72 04/18/17 on evaluation today patient appears to be doing about the same in regard to his lower extremity wounds bilaterally. He has continued to use the antifungal cream which does seem to be beneficial. Nonetheless the ulcers do seem to in some areas be epithelial eyes over and in other areas are still open. He is having no significant discomfort. 04/25/17-he is here in follow-up evaluation for multiple ulcerations to multiple toes bilaterally. He states he did see Dr. Ola Spurr again last week and continues antibiotic therapy. He is voicing no complaints or concerns, will continue with current treatment plan will possibility of adding compression therapy next week after an additional week of treatment/lotions to BLE prescribed by Dr Ola Spurr 05/02/17 he is here in follow up for for multiple ulcers to multiple toes bilaterally. we will stop using antifungal cream and will continue with silvercel and follow up next week Levick, Darshawn E. ([id number]) 05/09/17-he is here in follow-up for multiple  ulcerations to multiple toes bilaterally. There is improvement in appearance. He has not completely stopped using antifungal cream, but admits he has not using it between the toes. He has an appointment with Dr. Ola Spurr on 2/11, continues on doxycycline and Cipro. It has been 5 weeks of antibiotic therapy, we will order plain film xray to evaluate for osteomyelitis next week, prior to follow up with ID. Will continue with silvercel and follow up next week 05/16/17-he is here in follow-up evaluation for multiple ulcerations to multiple toes bilaterally and new wound to the right posterior heel. There is essentially no change in appearance,  deteriorating measurements; he has a history of waxing and waning measurements. He admits that he continues to apply moisturizer/cream/ointment to his toes despite weekly reminders to only apply silvercel to his toes. He states that he thinks the surgical shoe contributed to the superficial ulcer to his posterior heel, he is unable to articulate if this was an area of dry cracked skin as he has a similar area to the left heel. He now is wearing open toed slippers. He has an appointment with Dr. Ola Spurr on 2/11. We have ordered x-rays for her bilateral feet; he was advised to obtain the x-rays today or tomorrow. He will follow-up next week 05/23/17-he is here in follow-up evaluation for multiple ulcerations to multiple toes bilaterally and the right posterior heel. There is improvement in maceration. He has been compliant and not applying any moisturizing agent to his toes. He has been using Lac-Hydrin for his lower extremities with improvement. He did not go to his appointment on Monday with Dr. Ola Spurr secondary to financial concerns. X-rays for her bilateral feet showed: LEFT FOOT with slight erosion of the tuft of the distal phalanges of the left first and second toe suspicious for osteomyelitis, RIGHT FOOT with 1.erosion of the tufts of the  distal phalanges of the right first second and possibly third toes consistent with osteomyelitis, 2 no definitive abnormality of the calcaneus is seen on the images obtained, 3. Plantar calcaneal degenerative spur. We briefly discussed hyperbaric adjunctive therapy for treatment of chronic refractory osteomyelitis. I do not find an a1c in EMR, will contact PCP for record, or order if needed. He has been encouraged to contact Dr Ola Spurr office regarding the follow-up appointment, encouraged him to inquire about payment plan. We will continue with same treatment plan and follow-up next week. He states he is still taking antibiotics and has "a lot" left. He states he has been taking them as directed, 2 pills twice daily. According to Dr. Blane Ohara office notes he was originally started on 12/17 for 4 weeks and extended on 1/14 for an additional 4 weeks. He should be done with his antibiotic therapy, he was advised to bring his bottles and to his next appointment, we will contact pharmacy. 05/30/17-he is here in follow-up evaluation for multiple ulcerations to multiple toes bilaterally and the right posterior heel. He is accompanied by his brother-in-law. Wounds are stable. He has yet to make up with Dr. Ola Spurr. We contacted his PCP, with no record of recent A1c we will draw an A1c. His brother-in-law states that he was taken off all of his diabetic medication secondary to kidney function. 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 The antibiotics were counted and have approximately 2 weeks left, although they should be complete. The pill organizer reveals missing days. We discussed the need for consistent medications, to have optimal benefit of medication. He has a cousin that lives with him and he will ask her to check his organizer daily. He has been advised to follow up with Dr Ola Spurr, and  will go by the office today. He has been advised to quit smoking. 06/06/17-he is here in follow up evaluation. He has had to make an appointment with Dr. Ola Spurr. He did have blood work obtained, a1c 5. He continues to take antibiotic therapy. Significant improvement in bilateral lower extremity edema with compression therapy. Essentially no change in ulcerations to toes. He states he is "going to try something  different" and "let me know next week" if it works; he would not provide any additional information and was encouraged to follow our orders. We will follow up next week 06/13/17-he is here in follow-up evaluation. He has an appointment with Dr. Ola Spurr tomorrow morning. He states he purchased an ointment from [facility] and applied to his toes for 3 days, he does not remember the name of the ointment. There is improvement to his wounds, minimal maceration. He continues to take antibiotic therapy, this should have been completed last month. His brother-in-law who regularly accompanies his appointments was asked to take the bottles to the appointment tomorrow with Dr. Ola Spurr so he is aware. We will continue with 3 layer compression, and order OPEN TOE compression 20-30mmHg; we will apply compression stockings next week. He continues to smoke, smoked "2 cigarettes" last week 06/20/17 on evaluation today patient did receive his compression stockings which he has with him today for both lower extremities. With that being said he tells me at this point in time that he is very happy to have these he really is not a big fan of the compression wraps that we have been utilizing although they have been of great benefit for him. Nonetheless at this point he does want to switch to the compression stockings. In my opinion as long as he is continuing with compression I'm okay with the stockings or the wraps. 06/27/17-he is here in follow-up evaluation for multiple ulcerations to his bilateral toes.  There is some improvement in appearance. He is compliant in wearing his compression stockings with significant improvement in lower extremity edema. He saw Dr Ola Spurr on 3/8, per his notes they would redraw ESR and CRP; plan to continue antibiotic therapy if these remain elevated. I do not see an ESR or CRP level in Epic. The patient continues to take antibiotics. 07/11/17-He is here in follow-up evaluation for multiple ulcerations to multiple toes bilaterally. He presents with complete epithelialization to the right third toe; there has been no deterioration. He continues on antibiotic therapy. He will follow-up next week 07/18/17-He is here in follow-up evaluation for multiple ulcerations to multiple toes bilaterally. He continues to make improvement. He continues on antibiotic therapy. He states he has been using something additional to our orders, he does Fjelstad, Kemp E. ([id number]) not elaborate but states he will bring it in next week. Patient History Information obtained from Patient. Social History Current every day smoker, Marital Status - Widowed, Alcohol Use - Daily - quit drinking about a week ago, Drug Use - No History, Caffeine Use - Moderate. Medical And Surgical History Notes Oncologic 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 Objective Constitutional Vitals Time Taken: 8:36 AM, Height: 69 in, Weight: 168 lbs, BMI: 24.8, Temperature: 97.7 F, Pulse: 66 bpm, Respiratory Rate: 16 breaths/min, Blood Pressure: 136/81 mmHg. Integumentary (Hair, Skin) Wound #1 status is Open. Original cause of wound was Gradually Appeared. The wound is located on the Right Toe Great. The wound measures 0.4cm length x 0.6cm width x 0.1cm depth; 0.188cm^2 area and 0.019cm^3 volume. There is Fat Layer (Subcutaneous Tissue) Exposed exposed. There is no tunneling or undermining noted. There is a large amount of serosanguineous drainage noted. The wound margin is flat and intact.  There is large (67-100%) red granulation within the wound bed. There is a small (1-33%) amount of necrotic tissue within the wound bed including Eschar and Adherent Slough. The periwound skin appearance exhibited: Callus, Excoriation, Maceration. The periwound skin appearance did  not exhibit: Crepitus, Induration, Rash, Scarring, Dry/Scaly, Atrophie Blanche, Cyanosis, Ecchymosis, Hemosiderin Staining, Mottled, Pallor, Rubor, Erythema. Periwound temperature was noted as No Abnormality. Wound #2 status is Open. Original cause of wound was Gradually Appeared. The wound is located on the Right Toe Second. The wound measures 0.6cm length x 1.4cm width x 0.1cm depth; 0.66cm^2 area and 0.066cm^3 volume. There is Fat Layer (Subcutaneous Tissue) Exposed exposed. There is no tunneling or undermining noted. There is a large amount of serosanguineous drainage noted. The wound margin is flat and intact. There is large (67-100%) red granulation within the wound bed. There is a small (1-33%) amount of necrotic tissue within the wound bed including Adherent Slough. The periwound skin appearance exhibited: Scarring, Maceration. The periwound skin appearance did not exhibit: Callus, Crepitus, Excoriation, Induration, Rash, Dry/Scaly, Atrophie Blanche, Cyanosis, Ecchymosis, Hemosiderin Staining, Mottled, Pallor, Rubor, Erythema. Periwound temperature was noted as No Abnormality. Wound #5 status is Open. Original cause of wound was Gradually Appeared. The wound is located on the Left Toe Great. The wound measures 0.8cm length x 1.9cm width x 0.1cm depth; 1.194cm^2 area and 0.119cm^3 volume. There is Fat Layer (Subcutaneous Tissue) Exposed exposed. There is no tunneling or undermining noted. There is a large amount of serosanguineous drainage noted. Foul odor after cleansing was noted. The wound margin is flat and intact. There is medium (34-66%) red granulation within the wound bed. There is a small (1-33%) amount of  necrotic tissue within the wound bed including Adherent Slough. The periwound skin appearance exhibited: Maceration. The periwound skin appearance did not exhibit: Callus, Crepitus, Excoriation, Induration, Rash, Scarring, Dry/Scaly, Atrophie Blanche, Cyanosis, Ecchymosis, Hemosiderin Staining, Mottled, Pallor, Rubor, Erythema. Periwound temperature was noted as No Abnormality. SOTERO, BRINKMEYER ([id number]) Wound #6 status is Open. Original cause of wound was Gradually Appeared. The wound is located on the Left Toe Second. The wound measures 0.5cm length x 0.7cm width x 0.1cm depth; 0.275cm^2 area and 0.027cm^3 volume. There is Fat Layer (Subcutaneous Tissue) Exposed exposed. There is no tunneling or undermining noted. There is a large amount of serosanguineous drainage noted. The wound margin is flat and intact. There is large (67-100%) red, hyper - granulation within the wound bed. There is a small (1-33%) amount of necrotic tissue within the wound bed including Adherent Slough. The periwound skin appearance exhibited: Maceration. The periwound skin appearance did not exhibit: Callus, Crepitus, Excoriation, Induration, Rash, Scarring, Dry/Scaly, Atrophie Blanche, Cyanosis, Ecchymosis, Hemosiderin Staining, Mottled, Pallor, Rubor, Erythema. Periwound temperature was noted as No Abnormality. 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 There was a Excisional Skin/Subcutaneous Tissue Debridement with a total area of 0.24 sq cm performed by Lawanda Cousins, NP. With the following instrument(s): Curette. to remove Non-Viable tissue/material Material removed includes Callus, Subcutaneous Tissue, and Slough, [place] after achieving pain control using Lidocaine 4% Topical Solution. No specimens  were taken. A time out was conducted at 08:57, prior to the start of the procedure. A Minimum amount of bleeding was controlled with Pressure. The procedure was tolerated well with a pain level of 0 throughout and a pain level of 0 following the procedure. Post Debridement Measurements: 0.4cm length x 0.6cm width x 0.2cm depth; 0.038cm^3 volume. Character of Wound/Ulcer Post Debridement requires further debridement. Severity of Tissue Post Debridement is: Fat layer exposed. Post procedure Diagnosis Wound #1: Same as Pre-Procedure Wound #2 Pre-procedure diagnosis of Wound #2 is a Diabetic Wound/Ulcer of the Lower Extremity located on the Right Toe Second .Severity of Tissue Pre Debridement is: Fat layer exposed. There was a Selective/Open Wound Skin/Epidermis Debridement with a total area of 0.84 sq cm performed by Lawanda Cousins, NP. With the following instrument(s): Curette. to remove Viable and Non-Viable tissue/material Material removed includes Skin: Dermis, Skin: Epidermis after achieving pain control using Lidocaine 4% Topical Solution. No specimens were taken. A time out was conducted at 08:57, prior to the start of the procedure. A Minimum amount of bleeding was controlled with Pressure. The procedure was tolerated well with a pain level of 0 throughout and a pain level of 0 following the procedure. Post Debridement  Measurements: 0.6cm length x 1.4cm width x 0.2cm depth; 0.132cm^3 volume. Character of Wound/Ulcer Post Debridement requires further debridement. Severity of Tissue Post Debridement is: Fat layer exposed. AMMIEL, GUINEY ([id number]) Post procedure Diagnosis Wound #2: Same as Pre-Procedure Wound #5 Pre-procedure diagnosis of Wound #5 is a Diabetic Wound/Ulcer of the Lower Extremity located on the Left Toe Great .Severity of Tissue Pre Debridement is: Fat layer exposed. There was a Selective/Open Wound Skin/Dermis Debridement with a total area of 1.52 sq cm performed by Lawanda Cousins, NP. With the following instrument(s): Curette. to remove Viable and Non-Viable tissue/material Material removed includes and Slough, Skin: Dermis, Fibrin/Exudate, and Slough after achieving pain control using Lidocaine 4% Topical Solution. No specimens were taken. A time out was conducted at 08:57, prior to the start of the procedure. A Minimum amount of bleeding was controlled with Pressure. The procedure was tolerated well with a pain level of 0 throughout and a pain level of 0 following the procedure. Post Debridement Measurements: 0.8cm length x 1.9cm width x 0.2cm depth; 0.239cm^3 volume. Character of Wound/Ulcer Post Debridement requires further debridement. Severity of Tissue Post Debridement is: Fat layer exposed. Post procedure Diagnosis Wound #5: Same as Pre-Procedure Wound #6 Pre-procedure diagnosis of Wound #6 is a Diabetic Wound/Ulcer of the Lower Extremity located on the Left Toe Second .Severity of Tissue Pre Debridement is: Fat layer exposed. There was a Excisional Skin/Subcutaneous Tissue Debridement with a total area of 0.35 sq cm performed by Lawanda Cousins, NP. With the following instrument(s): Curette. to remove Viable and Non-Viable tissue/material Material removed includes Subcutaneous Tissue, Slough, [place], and Hyper-granulation after achieving pain control using Lidocaine 4%  Topical Solution. No specimens were taken. A time out was conducted at 08:57, prior to the start of the procedure. A Minimum amount of bleeding was controlled with Pressure. The procedure was tolerated well with a pain level of 0 throughout and a pain level of 0 following the procedure. Post Debridement Measurements: 0.5cm length x 0.7cm width x 0.2cm depth; 0.055cm^3 volume. Character of Wound/Ulcer Post Debridement requires further debridement. Severity of Tissue Post Debridement is: Fat layer exposed. Post procedure Diagnosis Wound #6: Same as Pre-Procedure Plan Wound Cleansing: Wound #1 Right Toe Great: Clean wound with Normal Saline. May Shower,  gently pat wound dry prior to applying new dressing. Wound #2 Right Toe Second: Clean wound with Normal Saline. May Shower, gently pat wound dry prior to applying new dressing. Wound #5 Left Toe Great: Clean wound with Normal Saline. May Shower, gently pat wound dry prior to applying new dressing. Wound #6 Left Toe Second: Clean wound with Normal Saline. May Shower, gently pat wound dry prior to applying new dressing. Anesthetic (add to Medication List): Wound #1 Right Toe Great: Topical Lidocaine 4% cream applied to wound bed prior to debridement (In Clinic Only). Wound #2 Right Toe Second: Topical Lidocaine 4% cream applied to wound bed prior to debridement (In Clinic Only). Wound #5 Left Toe Great: Topical Lidocaine 4% cream applied to wound bed prior to debridement (In Clinic Only). Wound #6 Left Toe Second: Topical Lidocaine 4% cream applied to wound bed prior to debridement (In Clinic Only). RENARD, CAPERTON ([id number]) Primary Wound Dressing: Wound #1 Right Toe Great: Silver Alginate - silvercel Wound #2 Right Toe Second: Silver Alginate - silvercel Wound #5 Left Toe Great: Silver Alginate - silvercel Wound #6 Left Toe Second: Silver Alginate - silvercel Secondary Dressing: Wound #1 Right Toe Great: ABD and  Kerlix/Conform Wound #2 Right Toe Second: ABD and Kerlix/Conform Wound #5 Left Toe Great: ABD and Kerlix/Conform Wound #6 Left Toe Second: ABD and Kerlix/Conform Dressing Change Frequency: Wound #1 Right Toe Great: Change dressing every day. Wound #2 Right Toe Second: Change dressing every day. Wound #5 Left Toe Great: Change dressing every day. Wound #6 Left Toe Second: Change dressing every day. Follow-up Appointments: Wound #1 Right Toe Great: Return Appointment in 1 week. Wound #2 Right Toe Second: Return Appointment in 1 week. Wound #5 Left Toe Great: Return Appointment in 1 week. Wound #6 Left Toe Second: Return Appointment in 1 week. Edema Control: Patient to wear own compression stockings Elevate legs to the level of the heart and pump ankles as often as possible Additional Orders / Instructions: Wound #1 Right Toe Great: Stop Smoking Increase protein intake. Other: - Please add vitamin A, vitamin C and zinc supplements to your diet Wound #2 Right Toe Second: Stop Smoking Increase protein intake. Other: - Please add vitamin A, vitamin C and zinc supplements to your diet Wound #5 Left Toe Great: Stop Smoking Increase protein intake. Other: - Please add vitamin A, vitamin C and zinc supplements to your diet Wound #6 Left Toe Second: Stop Smoking Increase protein intake. Other: - Please add vitamin A, vitamin C and zinc supplements to your diet The following medication(s) was prescribed: lidocaine topical 4 % cream 1 1 cream topical was prescribed at facility ALTA, SHOBER ([id number]) Electronic Signature(s) Signed: 07/18/2017 9:29:17 AM By: Lawanda Cousins Entered By: Lawanda Cousins on 07/18/2017 09:29:16 Catterton, [name] ([id number]) -------------------------------------------------------------------------------- ROS/PFSH Details Patient Name: Gaylord, Rajah E. Date of Service: 07/18/2017 8:30 AM Medical Record Number: [id number] Patient Account Number:  [id number] Date of Birth/Sex: [birth year] (68 y.o. M) Treating RN: Ahmed Prima Primary Care Provider: Lamonte Sakai Other Clinician: Referring Provider: Lamonte Sakai Treating Provider/Extender: Cathie Olden in Treatment: 68 Information Obtained From Patient Wound History Do you currently have one or more open woundso Yes How many open wounds do you currently haveo 6 Approximately how long have you had your woundso 3 months How have you been treating your wound(s) until nowo ointment and bandage Has your wound(s) ever healed and then re-openedo No Have you had any lab work done in the past montho No Have you  tested positive for an antibiotic resistant organism (MRSA, VRE)o No Have you tested positive for osteomyelitis (bone infection)o No Have you had any tests for circulation on your legso Yes Who ordered the testo PCP Where was the test doneo [facility] Medical History: Negative for: Cataracts; Glaucoma; Optic Neuritis Ear/Nose/Mouth/Throat Medical History: Negative for: Chronic sinus problems/congestion; Middle ear problems Hematologic/Lymphatic Medical History: Positive for: Anemia; Lymphedema Negative for: Hemophilia; Human Immunodeficiency Virus; Sickle Cell Disease Respiratory Medical History: Negative for: Aspiration; Asthma; Chronic Obstructive Pulmonary Disease (COPD); Pneumothorax; Sleep Apnea; Tuberculosis Cardiovascular Medical History: Positive for: Congestive Heart Failure; Hypertension; Peripheral Venous Disease Negative for: Angina; Arrhythmia; Coronary Artery Disease; Deep Vein Thrombosis; Hypotension; Myocardial Infarction; Peripheral Arterial Disease; Phlebitis; Vasculitis Gastrointestinal Medical History: Negative for: Cirrhosis ; Colitis; Crohnos; Hepatitis A; Hepatitis B; Hepatitis C Mccabe, Rondell E. ([id number]) Endocrine Medical History: Positive for: Type II Diabetes Treated with: Oral agents Blood sugar tested every day: Yes Tested :  QD Genitourinary Medical History: Negative for: End Stage Renal Disease Immunological Medical History: Negative for: Lupus Erythematosus; Raynaudos; Scleroderma Integumentary (Skin) Medical History: Negative for: History of Burn; History of pressure wounds Musculoskeletal Medical History: Negative for: Gout; Rheumatoid Arthritis; Osteoarthritis; Osteomyelitis Neurologic Medical History: Positive for: Neuropathy Negative for: Dementia; Quadriplegia; Paraplegia; Seizure Disorder Oncologic Medical History: Past Medical History Notes: 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 Electronic Signature(s) Signed: 07/18/2017 3:26:23 PM By: Lawanda Cousins Signed: 07/22/2017 4:22:27 PM By: Warnell Bureau ([id number]) Entered By: Lawanda Cousins on 07/18/2017 09:29:02 Rostad, [name] ([id number]) -------------------------------------------------------------------------------- SuperBill Details Patient Name: Beaubien, Esker E. Date of Service: 07/18/2017 Medical Record Number: [id number] Patient Account Number: [id number] Date of Birth/Sex: 24-Apr-1950 (67 y.o. M) Treating RN: Ahmed Prima Primary Care Provider: Lamonte Sakai Other Clinician: Referring Provider: Lamonte Sakai Treating Provider/Extender: Cathie Olden in Treatment: 42 Diagnosis Coding ICD-10 Codes Code  Description E11.621 Type 2 diabetes mellitus with foot ulcer I87.313 Chronic venous hypertension (idiopathic) with ulcer of bilateral lower extremity I89.0 Lymphedema, not elsewhere classified L97.522 Non-pressure chronic ulcer of other part of left foot with fat layer exposed L97.512 Non-pressure chronic ulcer of other part of right foot with fat layer exposed F17.218 Nicotine dependence, cigarettes, 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 abuse 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, right ankle and foot Facility Procedures CPT4 Code: 10175102 Description: 11042 - DEB SUBQ TISSUE 20 SQ CM/< ICD-10 Diagnosis Description L97.522 Non-pressure chronic ulcer of other part of left foot with fat lay L97.512 Non-pressure chronic ulcer of other part of right foot with fat la Modifier: er exposed yer exposed Quantity: 1 CPT4 Code: [id number] Description: 97597 - DEBRIDE WOUND 1ST 20 SQ CM OR < ICD-10 Diagnosis Description I89.0 Lymphedema, not elsewhere classified L97.512 Non-pressure chronic ulcer of other part of right foot with fat la Modifier: yer exposed Quantity: 1 Physician Procedures CPT4 Code: 4235361 Description: 11042 - WC PHYS SUBQ TISS 20 SQ CM ICD-10 Diagnosis Description L97.522 Non-pressure chronic ulcer of other part of left foot with fat lay  L97.512 Non-pressure chronic ulcer of other part of right foot with fat la Modifier: er exposed yer exposed Quantity: 1 CPT4 Code: [id number] Description: [place] - WC PHYS DEBR WO ANESTH 20 SQ CM ICD-10 Diagnosis Description I89.0 Lymphedema, not elsewhere classified L97.512 Non-pressure chronic ulcer of other part of right foot with fat la Modifier: yer exposed Quantity: 1 Electronic Signature(s) Signed: 07/18/2017 9:39:17 AM By: Stanton Kidney ([id number]) Entered By: Lawanda Cousins on 07/18/2017 09:39:16

## 2017-07-30 NOTE — Progress Notes (Signed)
JOVIN, FESTER ([id number]) Visit Report for 07/25/2017 Chief Complaint Document Details Patient Name: Stamos, YOON BARCA. Date of Service: 07/25/2017 1:15 PM Medical Record Number: [id number] Patient Account Number: [id number] Date of Birth/Sex: [birth year] (67 y.o. M) Treating RN: Ahmed Prima Primary Care Provider: Lamonte Sakai Other Clinician: Referring Provider: Lamonte Sakai Treating Provider/Extender: Cathie Olden in Treatment: 59 Information Obtained from: Patient Chief Complaint Patient presents for treatment of an open diabetic ulcer to both feet Electronic Signature(s) Signed: 07/25/2017 2:43:04 PM By: Lawanda Cousins Entered By: Lawanda Cousins on 07/25/2017 14:43:04 Marcinek, [name] ([id number]) -------------------------------------------------------------------------------- Debridement Details Patient Name: Purdon, Joshva E. Date of Service: 07/25/2017 1:15 PM Medical Record Number: [id number] Patient Account Number: [id number] Date of Birth/Sex: March 28, 1951 (67 y.o. M) Treating RN: Ahmed Prima Primary Care Provider: Lamonte Sakai Other Clinician: Referring Provider: Lamonte Sakai Treating Provider/Extender: Cathie Olden in Treatment: 43 Debridement Performed for Wound #1 Right Toe Great Assessment: Performed By: Physician Lawanda Cousins, NP Debridement Type: Debridement Severity of Tissue Pre Fat layer exposed Debridement: Pre-procedure Verification/Time Yes - 13:58 Out Taken: Start Time: 13:58 Pain Control: Lidocaine 4% Topical Solution Total Area Debrided (L x W): 0.4 (cm) x 0.6 (cm) = 0.24 (cm) Tissue and other material Viable, Non-Viable, Slough, Subcutaneous, Fibrin/Exudate, Slough debrided: Level: Skin/Subcutaneous Tissue Debridement Description: Excisional Instrument: Curette Bleeding: Minimum Hemostasis Achieved: Pressure End Time: 13:59 Procedural Pain: 0 Post Procedural Pain: 0 Response to Treatment: Procedure was tolerated well Post  Debridement Measurements of Total Wound Length: (cm) 0.4 Width: (cm) 0.6 Depth: (cm) 0.3 Volume: (cm) 0.057 Character of Wound/Ulcer Post Debridement: Requires Further Debridement Severity of Tissue Post Debridement: Fat layer exposed Post Procedure Diagnosis Same as Pre-procedure Electronic Signature(s) Signed: 07/25/2017 3:41:30 PM By: Lawanda Cousins Signed: 07/25/2017 5:13:37 PM By: Alric Quan Entered By: Alric Quan on 07/25/2017 13:59:41 Linders, [name] ([id number]) -------------------------------------------------------------------------------- HPI Details Patient Name: Weinreb, Stirling E. Date of Service: 07/25/2017 1:15 PM Medical Record Number: [id number] Patient Account Number: [id number] Date of Birth/Sex: September 22, 1950 (67 y.o. M) Treating RN: Ahmed Prima Primary Care Provider: Lamonte Sakai Other Clinician: Referring Provider: Lamonte Sakai Treating Provider/Extender: Cathie Olden in Treatment: 23 History of Present Illness Location: bilateral feet ulceration on the toes Quality: Patient reports experiencing a dull pain to affected area(s). Severity: Patient states wound are getting better Duration: Patient has had the wound for > 3 months prior to seeking treatment at the wound center Timing: Pain in wound is constant (hurts all the time) Context: The wound would happen gradually Modifying Factors: Other treatment(s) tried include:treatment for lymphedema and is seen by the podiatrist Dr. Caryl Comes Associated Signs and Symptoms: Patient reports having increase swelling. HPI Description: 67 year old patient here to see as for bilateral feet ulceration to on his left first and second toe and 2 on his right first and second toe, which she's had for about 4 months. 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 Donella Stade. The patient is also  undergoing workup by medical oncology for a intra-abdominal lymphadenopathy. 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 He is also status post appendectomy, inguinal lymph node biopsy, rectal biopsy and rectal examination under anesthesia. he currently smokes cigarettes about half packet a day. In March of this year he was seen by Dr. Hortencia Pilar, for  evaluation of bilateral varicose veins and besides wearing compression stockings he had recommended laser ablation of the right and left great saphenous veins to eleviate the symptoms and complications of severe superficial venous reflux disease. He also recommended lymphedema pumps for better control of his lymphedema. The patient recently has had on 08/23/2016, right greater saphenous vein ablation with the laser energy Earlier lower extremity venous reflux examination done on 05/08/2016 showed no DVT or SVT both lower legs but incompetence of bilateral great saphenous veins was present. A lower arterial study was also done and there was no significant right lower and left lower extremity problems based on a normal toe brachial index bilaterally and the ABI was 1.21 the left and 1.23 on the right. His post ablation venous duplex examination showed successful ablation of the right GS vein with thrombus formation 2 below the right saphenofemoral junction. The deep system was patent without evidence of thrombosis and this was done on 08/30/2016. 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 Addendum: regarding his x-rays done today and x-ray of the left foot -- IMPRESSION: No objective evidence of osteomyelitis. There are soft tissue changes which may reflect cellulitis. X-ray of the right foot -- IMPRESSION:Findings  compatible with cellulitis of the toes. No objective evidence of osteomyelitis is observed. 10/01/16 on evaluation today patient's wounds appeared to be doing some better. I did review the x-rays as well which showed no evidence of osteomyelitis although there was evidence on x-ray of cellulitis. He fortunately is not having any discomfort although he continues to have some swelling. He does not remember being on any antibiotics recently. 10/15/16 on evaluation today patient's wounds overall appear to be doing better although he does have a new location noted on the left foot. Fortunately he is not having significant pain. It almost has the appearance that something is rubbing on the end of his toes but he wears the open toe shoes and according to what he is telling me never wears anything that would rub on his foot. There is no evidence of infection and specifically no evidence of a fungal infection 10/22/16 On evaluation today patient's wounds appeared to be doing better compared to last week in regard to his bilateral Crittendon, Dasan E. ([id number]) lower extremities. Fortunately I happy with how things are progressing although he still has ulcers I feel like that he is improving and appropriate manner. 11/12/16 on evaluation today patient appears to be doing well in regard to his bilateral feet and the respective wounds. We have been using surrounding her dressings along with an antifungal cream which seems to be doing very well. He has no bilateral dysfunction noticed that the rituals are weight loss at this point. He also has no nausea or vomiting a note purulent discharge. He did see Vein and vascular today and he tells me that they told him he could have surgery for his venous stasis but they did not feel like it was worth it in his words. Fortunately patient's wounds do appear to be getting sneakily better. 11/26/2016 -- he says he is going to have some surgery during this week at [facility] for  possibly a colon resection. 12/31/2016 -- the patient has been noncompliant with his smoking and I'm not sure whether he is also started drinking again. He continues to be very nonchalant about his care 01/14/2017 -- the patient's HandP has been  reviewed well and I understand he is being compliant with trying to give up smoking and his local dressing changes. He does not have any surgical options of 4 to him by his vascular surgeons.he was last seen in early August by Dr. Hortencia Pilar who recommended compression stockings,and possibly lymph pumps in 2-3 months after doing a review ultrasound. 01/28/2017 - the patient did not have any fresh complaints but on examination I noted a large lacerated wound on the plantar aspect of his right fourth toe which had a lot of necrotic debris and it probes down to bone. 02/07/2017 -- x-ray of the right foot -- IMPRESSION: Soft tissue swelling about the first through fourth toes consistent with cellulitis. New destructive change in the tuft of the distal phalanx of the great toe is consistent with osteomyelitis. 02/14/2017 -- the patient's MRI is pending this coming Monday and he still continues to smoke. We have again gone over off loading of his wounds in great detail and he says he's been compliant. 02/21/2017 -- MR of the right foot -- IMPRESSION: 1. Soft tissue ulcer at the tip of the first, second and third toe knows. Cortical irregularity and bone marrow edema in the first distal phalanx most concerning for osteomyelitis. Mild marrow edema in the second and third distal phalanx without definite cortical destruction which may reflect early osteomyelitis versus reactive marrow edema. 2. Soft tissue edema surrounding the first phalanx most consistent with cellulitis. the patient was also recently evaluated by his medical oncologist Dr. 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 She is  treating him with weekly Procrit. She is also keeping intra-abdominal lymphadenopathy and right lower lobe lung nodule under observation. 04/04/2017 -- he was seen by Dr. Adrian Prows on 03/25/2017 -- after review he empirically put him on ciprofloxacin and doxycycline as they have good bone penetration and good bioavailability and it will cover the usual pathogens and diabetic foot osteomyelitis. He will check inflammatory markers and plan a 76-70 week old records. C-reactive protein was 0.3 and the ESR was 72 04/18/17 on evaluation today patient appears to be doing about the same in regard to his lower extremity wounds bilaterally. He has continued to use the antifungal cream which does seem to be beneficial. Nonetheless the ulcers do seem to in some areas be epithelial eyes over and in other areas are still open. He is having no significant discomfort. 04/25/17-he is here in follow-up evaluation for multiple ulcerations to multiple toes bilaterally. He states he did see Dr. Ola Spurr again last week and continues antibiotic therapy. He is voicing no complaints or concerns, will continue with current treatment plan will possibility of adding compression therapy next week after an additional week of treatment/lotions to BLE prescribed by Dr Ola Spurr 05/02/17 he is here in follow up for for multiple ulcers to multiple toes bilaterally. we will stop using antifungal cream and will continue with silvercel and follow up next week 05/09/17-he is here in follow-up for multiple ulcerations to multiple toes bilaterally. There is improvement in appearance. He has not completely stopped using antifungal cream, but admits he has not using it between the toes. He has an appointment with Dr. Ola Spurr on 2/11, continues on doxycycline and Cipro. It has been 5 weeks of antibiotic therapy, we will order plain film xray to evaluate for osteomyelitis next week, prior to follow up with ID. Will continue with  silvercel and follow up next week  05/16/17-he is here in follow-up evaluation for multiple ulcerations to multiple toes bilaterally and new wound to the right posterior heel. There is essentially no change in appearance, deteriorating measurements; he has a history of waxing and waning measurements. He admits that he continues to apply moisturizer/cream/ointment to his toes despite weekly reminders to only apply silvercel to his toes. He states that he thinks the surgical shoe contributed to the superficial ulcer to his posterior Olivo, Reshaun E. ([id number]) heel, he is unable to articulate if this was an area of dry cracked skin as he has a similar area to the left heel. He now is wearing open toed slippers. He has an appointment with Dr. Ola Spurr on 2/11. We have ordered x-rays for her bilateral feet; he was advised to obtain the x-rays today or tomorrow. He will follow-up next week 05/23/17-he is here in follow-up evaluation for multiple ulcerations to multiple toes bilaterally and the right posterior heel. There is improvement in maceration. He has been compliant and not applying any moisturizing agent to his toes. He has been using Lac-Hydrin for his lower extremities with improvement. He did not go to his appointment on Monday with Dr. Ola Spurr secondary to financial concerns. X-rays for her bilateral feet showed: LEFT FOOT with slight erosion of the tuft of the distal phalanges of the left first and second toe suspicious for osteomyelitis, RIGHT FOOT with 1.erosion of the tufts of the distal phalanges of the right first second and possibly third toes consistent with osteomyelitis, 2 no definitive abnormality of the calcaneus is seen on the images obtained, 3. Plantar calcaneal degenerative spur. We briefly discussed hyperbaric adjunctive therapy for treatment of chronic refractory osteomyelitis. I do not find an a1c in EMR, will contact PCP for record, or order if needed. He has been  encouraged to contact Dr Ola Spurr office regarding the follow-up appointment, encouraged him to inquire about payment plan. We will continue with same treatment plan and follow-up next week. He states he is still taking antibiotics and has "a lot" left. He states he has been taking them as directed, 2 pills twice daily. According to Dr. Blane Ohara office notes he was originally started on 12/17 for 4 weeks and extended on 1/14 for an additional 4 weeks. He should be done with his antibiotic therapy, he was advised to bring his bottles and to his next appointment, we will contact pharmacy. 05/30/17-he is here in follow-up evaluation for multiple ulcerations to multiple toes bilaterally and the right posterior heel. He is accompanied by his brother-in-law. Wounds are stable. He has yet to make up with Dr. Ola Spurr. We contacted his PCP, with no record of recent A1c we will draw an A1c. His brother-in-law states that he was taken off all of his diabetic medication secondary to kidney function. 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 The antibiotics were counted and have approximately 2 weeks left, although they should be complete. The pill organizer reveals missing days. We discussed the need for consistent medications, to have optimal benefit of medication. He has a cousin that lives with him and he will ask her to check his organizer daily. He has been advised to follow up with Dr Ola Spurr, and will go by the office today. He has been advised to quit smoking. 06/06/17-he is here in follow up evaluation. He has had to make an appointment with Dr. Ola Spurr. He did have blood work obtained, a1c 5.  He continues to take antibiotic therapy. Significant improvement in bilateral lower extremity edema with compression therapy. Essentially no change in ulcerations to toes. He states he is "going to try  something different" and "let me know next week" if it works; he would not provide any additional information and was encouraged to follow our orders. We will follow up next week 06/13/17-he is here in follow-up evaluation. He has an appointment with Dr. Ola Spurr tomorrow morning. He states he purchased an ointment from [facility] and applied to his toes for 3 days, he does not remember the name of the ointment. There is improvement to his wounds, minimal maceration. He continues to take antibiotic therapy, this should have been completed last month. His brother-in-law who regularly accompanies his appointments was asked to take the bottles to the appointment tomorrow with Dr. Ola Spurr so he is aware. We will continue with 3 layer compression, and order OPEN TOE compression 20-30mmHg; we will apply compression stockings next week. He continues to smoke, smoked "2 cigarettes" last week 06/20/17 on evaluation today patient did receive his compression stockings which he has with him today for both lower extremities. With that being said he tells me at this point in time that he is very happy to have these he really is not a big fan of the compression wraps that we have been utilizing although they have been of great benefit for him. Nonetheless at this point he does want to switch to the compression stockings. In my opinion as long as he is continuing with compression I'm okay with the stockings or the wraps. 06/27/17-he is here in follow-up evaluation for multiple ulcerations to his bilateral toes. There is some improvement in appearance. He is compliant in wearing his compression stockings with significant improvement in lower extremity edema. He saw Dr Ola Spurr on 3/8, per his notes they would redraw ESR and CRP; plan to continue antibiotic therapy if these remain elevated. I do not see an ESR or CRP level in Epic. The patient continues to take antibiotics. 07/11/17-He is here in follow-up  evaluation for multiple ulcerations to multiple toes bilaterally. He presents with complete epithelialization to the right third toe; there has been no deterioration. He continues on antibiotic therapy. He will follow-up next week 07/18/17-He is here in follow-up evaluation for multiple ulcerations to multiple toes bilaterally. He continues to make improvement. He continues on antibiotic therapy. He states he has been using something additional to our orders, he does not elaborate but states he will bring it in next week. 07/25/17-He is here in follow up evaluation for multiple ulcerations to bilateral toes. He is stable. He has completed antibiotic therapy. He admits to "filing" his toes after showers each evening, this is what he was referencing last week; he does not filing for the wounds. We will switch to [facility] and monitor for any improvement, he will follow-up next week Electronic Signature(s) Signed: 07/25/2017 2:44:51 PM By: Stanton Kidney ([id number]) Entered By: Lawanda Cousins on 07/25/2017 14:44:51 Lucente, [name] ([id number]) -------------------------------------------------------------------------------- Physician Orders Details Patient Name: Glauser, Jaquarius E. Date of Service: 07/25/2017 1:15 PM Medical Record Number: [id number] Patient Account Number: [id number] Date of Birth/Sex: 16-Jan-1951 (67 y.o. M) Treating RN: Ahmed Prima Primary Care Provider: Lamonte Sakai Other Clinician: Referring Provider: Lamonte Sakai Treating Provider/Extender: Cathie Olden in Treatment: 65 Verbal / Phone Orders: No Diagnosis Coding Wound Cleansing Wound #1 Right Toe Great o Clean wound with Normal Saline. o Cleanse wound with mild soap and  water o May Shower, gently pat wound dry prior to applying new dressing. Wound #2 Right Toe Second o Clean wound with Normal Saline. o Cleanse wound with mild soap and water o May Shower, gently pat wound dry prior to  applying new dressing. Wound #5 Left Toe Great o Clean wound with Normal Saline. o Cleanse wound with mild soap and water o May Shower, gently pat wound dry prior to applying new dressing. Wound #12 Right,Dorsal Foot o Clean wound with Normal Saline. o Cleanse wound with mild soap and water o May Shower, gently pat wound dry prior to applying new dressing. Wound #6 Left Toe Second o Clean wound with Normal Saline. o Cleanse wound with mild soap and water o May Shower, gently pat wound dry prior to applying new dressing. Anesthetic (add to Medication List) Wound #1 Right Toe Great o Topical Lidocaine 4% cream applied to wound bed prior to debridement (In Clinic Only). Wound #2 Right Toe Second o Topical Lidocaine 4% cream applied to wound bed prior to debridement (In Clinic Only). Wound #5 Left Toe Great o Topical Lidocaine 4% cream applied to wound bed prior to debridement (In Clinic Only). Wound #12 Right,Dorsal Foot o Topical Lidocaine 4% cream applied to wound bed prior to debridement (In Clinic Only). Wound #6 Left Toe Second o Topical Lidocaine 4% cream applied to wound bed prior to debridement (In Clinic Only). Primary Wound Dressing Wound #1 Right Toe Davaughn Hillyard, IAN CASTAGNA ([id number]) o Hydrafera Blue Ready Transfer Wound #2 Right Toe Second o Hydrafera Blue Ready Transfer Wound #5 Left Toe Great o Hydrafera Blue Ready Transfer Wound #12 Right,Dorsal Foot o Hydrafera Blue Ready Transfer Wound #6 Left Toe Second o Hydrafera Blue Ready Transfer Secondary Dressing Wound #1 Right Toe Great o Dry Gauze Wound #2 Right Toe Second o Dry Gauze Wound #5 Left Toe Great o Dry Gauze Wound #12 Right,Dorsal Foot o Dry Gauze Wound #6 Left Toe Second o Dry Gauze Dressing Change Frequency Wound #1 Right Toe Great o Change dressing every day. Wound #2 Right Toe Second o Change dressing every day. Wound #5 Left Toe Great o  Change dressing every day. Wound #12 Right,Dorsal Foot o Change dressing every day. Wound #6 Left Toe Second o Change dressing every day. Follow-up Appointments Wound #1 Right Toe Great o Return Appointment in 1 week. Wound #2 Right Toe Second o Return Appointment in 1 week. Wound #5 Left Toe Great o Return Appointment in 1 week. MORGON, PAMER ([id number]) Wound #12 Right,Dorsal Foot o Return Appointment in 1 week. Wound #6 Left Toe Second o Return Appointment in 1 week. Edema Control o Patient to wear own compression stockings o Elevate legs to the level of the heart and pump ankles as often as possible Additional Orders / Instructions Wound #1 Right Toe Great o Stop Smoking o Increase protein intake. o Other: - Please add vitamin A, vitamin C and zinc supplements to your diet Wound #2 Right Toe Second o Stop Smoking o Increase protein intake. o Other: - Please add vitamin A, vitamin C and zinc supplements to your diet Wound #5 Left Toe Great o Stop Smoking o Increase protein intake. o Other: - Please add vitamin A, vitamin C and zinc supplements to your diet Wound #12 Right,Dorsal Foot o Stop Smoking o Increase protein intake. o Other: - Please add vitamin A, vitamin C and zinc supplements to your diet Wound #6 Left Toe Second o Stop Smoking o Increase protein intake. o  Other: - Please add vitamin A, vitamin C and zinc supplements to your diet Patient Medications Allergies: No Known Drug Allergies Notifications Medication Indication Start End lidocaine DOSE 1 - topical 4 % cream - 1 cream topical Electronic Signature(s) Signed: 07/25/2017 3:41:30 PM By: Lawanda Cousins Signed: 07/25/2017 5:13:37 PM By: Alric Quan Entered By: Alric Quan on 07/25/2017 14:05:57 Dedman, [name] ([id number]) -------------------------------------------------------------------------------- Prescription 07/25/2017 Patient Name:  [name] Provider: Lawanda Cousins NP Date of Birth: 01-07-1951 NPI#: [id number] Sex: Jerilynn Mages DEA#: [id number] Phone #: [phone] License #: Patient Address: [facility] [place], [place] [address] [address] [place],  [address] [phone] Allergies No Known Drug Allergies Medication Medication: Route: Strength: Form: lidocaine 4 % topical cream topical 4% cream Class: TOPICAL LOCAL ANESTHETICS Dose: Frequency / Time: Indication: 1 1 cream topical Number of Refills: Number of Units: 0 Generic Substitution: Start Date: End Date: One Time Use: Substitution Permitted No Note to Pharmacy: Signature(s): Date(s): Electronic Signature(s) Signed: 07/25/2017 3:41:30 PM By: Lawanda Cousins Signed: 07/25/2017 5:13:37 PM By: Alric Quan Entered By: Alric Quan on 07/25/2017 14:05:59 Swiech, [name] ([id number]) --------------------------------------------------------------------------------  Problem List Details Patient Name: Obara, Manveer E. Date of Service: 07/25/2017 1:15 PM Medical Record Number: [id number] Patient Account Number: [id number] Date of Birth/Sex: 1950/11/12 (67 y.o. M) Treating RN: Ahmed Prima Primary Care Provider: Lamonte Sakai Other Clinician: Referring Provider: Lamonte Sakai Treating Provider/Extender: Cathie Olden in Treatment: 84 Active Problems ICD-10 Impacting Encounter Code Description Active Date Wound Healing Diagnosis E11.621 Type 2 diabetes mellitus with foot ulcer 09/21/2016 Yes I87.313 Chronic venous hypertension (idiopathic) with ulcer of 09/21/2016 Yes bilateral lower extremity I89.0 Lymphedema, not elsewhere classified 09/21/2016 Yes L97.522 Non-pressure chronic ulcer of other part of left foot with fat 09/21/2016 Yes layer exposed L97.512 Non-pressure chronic ulcer of other part of right foot with fat 09/21/2016 Yes layer  exposed F17.218 Nicotine dependence, cigarettes, with other nicotine-induced 09/21/2016 Yes disorders F10.19 Alcohol abuse with unspecified alcohol-induced disorder 09/21/2016 Yes M86.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, right ankle and foot 02/21/2017 Yes Inactive Problems Resolved Problems Electronic Signature(s) ASHLEE, BEWLEY ([id number]) Signed: 07/25/2017 2:35:35 PM By: Lawanda Cousins Entered By: Lawanda Cousins on 07/25/2017 14:35:34 Clyne, [name] ([id number]) -------------------------------------------------------------------------------- Progress Note Details Patient Name: Budzynski, Jo E. Date of Service: 07/25/2017 1:15 PM Medical Record Number: [id number] Patient Account Number: [id number] Date of Birth/Sex: Oct 25, 1950 (67 y.o. M) Treating RN: Ahmed Prima Primary Care Provider: Lamonte Sakai Other Clinician: Referring Provider: Lamonte Sakai Treating Provider/Extender: Cathie Olden in Treatment: 1 Subjective Chief Complaint Information obtained from Patient Patient presents for treatment of an open diabetic ulcer to both feet History of Present Illness (HPI) The following HPI elements were documented for the patient's wound: Location: bilateral feet ulceration on the toes Quality: Patient reports experiencing a dull pain to affected area(s). Severity: Patient states wound are getting better Duration: Patient has had the wound for > 3 months prior to seeking treatment at the wound center Timing: Pain in wound is constant (hurts all the time) Context: The wound would happen gradually Modifying Factors: Other treatment(s) tried include:treatment for lymphedema and is seen by the podiatrist Dr. Caryl Comes Associated Signs and Symptoms: Patient reports having increase swelling. 67 year old patient here to see as for bilateral feet ulceration to on his left first and second toe and 2 on his right first and second toe, which she's had for about 4 months. 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 Donella Stade. The patient is also undergoing workup by medical oncology for a intra-abdominal lymphadenopathy. 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 He is also status post appendectomy, inguinal lymph node biopsy, rectal biopsy and rectal examination under anesthesia. he currently smokes cigarettes about half packet a day. In March of this year he was seen by Dr. Hortencia Pilar, for evaluation of bilateral varicose veins and besides wearing compression stockings he had recommended laser ablation of the right and left great saphenous veins to eleviate the symptoms and complications of severe superficial venous reflux disease. He also recommended lymphedema pumps for better control of his lymphedema. The patient recently has had on 08/23/2016, right greater saphenous vein ablation with the laser energy Earlier lower extremity venous reflux examination done on 05/08/2016 showed no DVT or SVT both lower legs but incompetence of bilateral great saphenous veins was present. A lower arterial study was also done and there was no significant right lower and left lower extremity problems based on a normal toe brachial index bilaterally and the ABI was 1.21 the left and 1.23 on the right. His post ablation venous duplex examination showed successful ablation of the right GS vein with thrombus formation 2 below the right saphenofemoral junction. The deep system was patent without evidence of thrombosis and this was done on 08/30/2016. 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 Addendum: regarding his x-rays done today and  x-ray of the left foot -- IMPRESSION: No objective evidence of osteomyelitis. There are soft tissue changes which may reflect cellulitis. X-ray of the right foot -- IMPRESSION:Findings compatible with cellulitis of the toes. No objective evidence of osteomyelitis is observed. 10/01/16 on evaluation today patient's wounds appeared to be doing some better. I did review the x-rays as well which showed no evidence of osteomyelitis although there was evidence on x-ray of cellulitis. He fortunately is not having any discomfort Cronkright, Roosvelt E. ([id number]) although he continues to have some swelling. He does not remember being on any antibiotics recently. 10/15/16 on evaluation today patient's wounds overall appear to be doing better although he does have a new location noted on the left foot. Fortunately he is not having significant pain. It almost has the appearance that something is rubbing on the end of his toes but he wears the open toe shoes and according to what he is telling me never wears anything that would rub on his foot. There is no evidence of infection and specifically no evidence of a fungal infection 10/22/16 On evaluation today patient's wounds appeared to be doing better compared to last week in regard to his bilateral lower extremities. Fortunately I happy with how things are progressing although he still has ulcers I feel like that he is improving and appropriate manner. 11/12/16 on evaluation today patient appears to be doing well in regard to his bilateral feet and the respective wounds. We have been using surrounding her dressings along with an antifungal cream which seems to be doing very well. He has no bilateral dysfunction noticed that the rituals are weight loss at this point. He also has no nausea or vomiting a note purulent discharge. He did see Vein  and vascular today and he tells me that they told him he could have surgery for his venous stasis but they did not feel like it was  worth it in his words. Fortunately patient's wounds do appear to be getting sneakily better. 11/26/2016 -- he says he is going to have some surgery during this week at [facility] for possibly a colon resection. 12/31/2016 -- the patient has been noncompliant with his smoking and I'm not sure whether he is also started drinking again. He continues to be very nonchalant about his care 01/14/2017 -- the patient's HandP has been reviewed well and I understand he is being compliant with trying to give up smoking and his local dressing changes. He does not have any surgical options of 4 to him by his vascular surgeons.he was last seen in early August by Dr. Hortencia Pilar who recommended compression stockings,and possibly lymph pumps in 2-3 months after doing a review ultrasound. 01/28/2017 - the patient did not have any fresh complaints but on examination I noted a large lacerated wound on the plantar aspect of his right fourth toe which had a lot of necrotic debris and it probes down to bone. 02/07/2017 -- x-ray of the right foot -- IMPRESSION: Soft tissue swelling about the first through fourth toes consistent with cellulitis. New destructive change in the tuft of the distal phalanx of the great toe is consistent with osteomyelitis. 02/14/2017 -- the patient's MRI is pending this coming Monday and he still continues to smoke. We have again gone over off loading of his wounds in great detail and he says he's been compliant. 02/21/2017 -- MR of the right foot -- IMPRESSION: 1. Soft tissue ulcer at the tip of the first, second and third toe knows. Cortical irregularity and bone marrow edema in the first distal phalanx most concerning for osteomyelitis. Mild marrow edema in the second and third distal phalanx without definite cortical destruction which may reflect early osteomyelitis versus reactive marrow edema. 2. Soft tissue edema surrounding the first phalanx most consistent with cellulitis. the  patient was also recently evaluated by his medical oncologist Dr. 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 She is treating him with weekly Procrit. She is also keeping intra-abdominal lymphadenopathy and right lower lobe lung nodule under observation. 04/04/2017 -- he was seen by Dr. Adrian Prows on 03/25/2017 -- after review he empirically put him on ciprofloxacin and doxycycline as they have good bone penetration and good bioavailability and it will cover the usual pathogens and diabetic foot osteomyelitis. He will check inflammatory markers and plan a 14-59 week old records. C-reactive protein was 0.3 and the ESR was 72 04/18/17 on evaluation today patient appears to be doing about the same in regard to his lower extremity wounds bilaterally. He has continued to use the antifungal cream which does seem to be beneficial. Nonetheless the ulcers do seem to in some areas be epithelial eyes over and in other areas are still open. He is having no significant discomfort. 04/25/17-he is here in follow-up evaluation for multiple ulcerations to multiple toes bilaterally. He states he did see Dr. Ola Spurr again last week and continues antibiotic therapy. He is voicing no complaints or concerns, will continue with current treatment plan will possibility of adding compression therapy next week after an additional week of treatment/lotions to BLE prescribed by Dr Ola Spurr 05/02/17 he is here in follow up for for multiple ulcers to multiple toes  bilaterally. we will stop using antifungal cream and will continue with silvercel and follow up next week Koury, Jedrek E. ([id number]) 05/09/17-he is here in follow-up for multiple ulcerations to multiple toes bilaterally. There is improvement in appearance. He has not completely stopped using antifungal cream, but admits he has not using it between the toes. He has an appointment with Dr.  Ola Spurr on 2/11, continues on doxycycline and Cipro. It has been 5 weeks of antibiotic therapy, we will order plain film xray to evaluate for osteomyelitis next week, prior to follow up with ID. Will continue with silvercel and follow up next week 05/16/17-he is here in follow-up evaluation for multiple ulcerations to multiple toes bilaterally and new wound to the right posterior heel. There is essentially no change in appearance, deteriorating measurements; he has a history of waxing and waning measurements. He admits that he continues to apply moisturizer/cream/ointment to his toes despite weekly reminders to only apply silvercel to his toes. He states that he thinks the surgical shoe contributed to the superficial ulcer to his posterior heel, he is unable to articulate if this was an area of dry cracked skin as he has a similar area to the left heel. He now is wearing open toed slippers. He has an appointment with Dr. Ola Spurr on 2/11. We have ordered x-rays for her bilateral feet; he was advised to obtain the x-rays today or tomorrow. He will follow-up next week 05/23/17-he is here in follow-up evaluation for multiple ulcerations to multiple toes bilaterally and the right posterior heel. There is improvement in maceration. He has been compliant and not applying any moisturizing agent to his toes. He has been using Lac-Hydrin for his lower extremities with improvement. He did not go to his appointment on Monday with Dr. Ola Spurr secondary to financial concerns. X-rays for her bilateral feet showed: LEFT FOOT with slight erosion of the tuft of the distal phalanges of the left first and second toe suspicious for osteomyelitis, RIGHT FOOT with 1.erosion of the tufts of the distal phalanges of the right first second and possibly third toes consistent with osteomyelitis, 2 no definitive abnormality of the calcaneus is seen on the images obtained, 3. Plantar calcaneal degenerative spur. We briefly  discussed hyperbaric adjunctive therapy for treatment of chronic refractory osteomyelitis. I do not find an a1c in EMR, will contact PCP for record, or order if needed. He has been encouraged to contact Dr Ola Spurr office regarding the follow-up appointment, encouraged him to inquire about payment plan. We will continue with same treatment plan and follow-up next week. He states he is still taking antibiotics and has "a lot" left. He states he has been taking them as directed, 2 pills twice daily. According to Dr. Blane Ohara office notes he was originally started on 12/17 for 4 weeks and extended on 1/14 for an additional 4 weeks. He should be done with his antibiotic therapy, he was advised to bring his bottles and to his next appointment, we will contact pharmacy. 05/30/17-he is here in follow-up evaluation for multiple ulcerations to multiple toes bilaterally and the right posterior heel. He is accompanied by his brother-in-law. Wounds are stable. He has yet to make up with Dr. Ola Spurr. We contacted his PCP, with no record of recent A1c we will draw an A1c. His brother-in-law states that he was taken off all of his diabetic medication secondary to kidney function. 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 The antibiotics were counted and have approximately 2 weeks left, although they should be complete. The pill organizer reveals missing days. We discussed the need for consistent medications, to have optimal benefit of medication. He has a cousin that lives with him and he will ask her to check his organizer daily. He has been advised to follow up with Dr Ola Spurr, and will go by the office today. He has been advised to quit smoking. 06/06/17-he is here in follow up evaluation. He has had to make an appointment with Dr. Ola Spurr. He did have blood work obtained, a1c 5. He continues to take  antibiotic therapy. Significant improvement in bilateral lower extremity edema with compression therapy. Essentially no change in ulcerations to toes. He states he is "going to try something different" and "let me know next week" if it works; he would not provide any additional information and was encouraged to follow our orders. We will follow up next week 06/13/17-he is here in follow-up evaluation. He has an appointment with Dr. Ola Spurr tomorrow morning. He states he purchased an ointment from [facility] and applied to his toes for 3 days, he does not remember the name of the ointment. There is improvement to his wounds, minimal maceration. He continues to take antibiotic therapy, this should have been completed last month. His brother-in-law who regularly accompanies his appointments was asked to take the bottles to the appointment tomorrow with Dr. Ola Spurr so he is aware. We will continue with 3 layer compression, and order OPEN TOE compression 20-30mmHg; we will apply compression stockings next week. He continues to smoke, smoked "2 cigarettes" last week 06/20/17 on evaluation today patient did receive his compression stockings which he has with him today for both lower extremities. With that being said he tells me at this point in time that he is very happy to have these he really is not a big fan of the compression wraps that we have been utilizing although they have been of great benefit for him. Nonetheless at this point he does want to switch to the compression stockings. In my opinion as long as he is continuing with compression I'm okay with the stockings or the wraps. 06/27/17-he is here in follow-up evaluation for multiple ulcerations to his bilateral toes. There is some improvement in appearance. He is compliant in wearing his compression stockings with significant improvement in lower extremity edema. He saw Dr Ola Spurr on 3/8, per his notes they would redraw ESR and CRP; plan to  continue antibiotic therapy if these remain elevated. I do not see an ESR or CRP level in Epic. The patient continues to take antibiotics. 07/11/17-He is here in follow-up evaluation for multiple ulcerations to multiple toes bilaterally. He presents with complete epithelialization to the right third toe; there has been no deterioration. He continues on antibiotic therapy. He will follow-up next week 07/18/17-He is here in follow-up evaluation for multiple ulcerations to multiple toes bilaterally. He continues to make improvement. He continues on antibiotic therapy. He states he has been using something additional to our orders, he does Oommen, Chayce E. ([id number]) not elaborate but states he will bring it in next week. 07/25/17-He is here in follow up evaluation for multiple ulcerations to bilateral toes. He is stable. He has completed antibiotic therapy. He admits to "filing" his toes after showers each evening, this is what he was referencing last week; he does not filing for the wounds. We will switch to [facility] and monitor for any improvement, he will follow-up  next week Patient History Information obtained from Patient. Social History Current every day smoker, Marital Status - Widowed, Alcohol Use - Daily - quit drinking about a week ago, Drug Use - No History, Caffeine Use - Moderate. Medical And Surgical History Notes Oncologic 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 Objective Constitutional Vitals Time Taken: 1:16 PM, Height: 69 in, Weight: 168 lbs, BMI: 24.8, Temperature: 98.0 F, Pulse: 67 bpm, Respiratory Rate: 16 breaths/min, Blood Pressure: 152/83 mmHg. Integumentary (Hair, Skin) Wound #1 status is Open. Original cause of wound was Gradually Appeared. The wound is located on the Right Toe Great. The wound measures 0.4cm length x 0.6cm width x 0.2cm depth; 0.188cm^2 area and 0.038cm^3 volume. There is Fat Layer (Subcutaneous Tissue) Exposed exposed.  There is no tunneling or undermining noted. There is a large amount of serosanguineous drainage noted. The wound margin is flat and intact. There is large (67-100%) red granulation within the wound bed. There is a small (1-33%) amount of necrotic tissue within the wound bed including Eschar and Adherent Slough. The periwound skin appearance exhibited: Callus, Excoriation, Maceration. The periwound skin appearance did not exhibit: Crepitus, Induration, Rash, Scarring, Dry/Scaly, Atrophie Blanche, Cyanosis, Ecchymosis, Hemosiderin Staining, Mottled, Pallor, Rubor, Erythema. Periwound temperature was noted as No Abnormality. Wound #12 status is Open. Original cause of wound was Gradually Appeared. The wound is located on the Right,Dorsal Foot. The wound measures 0.6cm length x 0.5cm width x 0.1cm depth; 0.236cm^2 area and 0.024cm^3 volume. There is no tunneling or undermining noted. There is a medium amount of serous drainage noted. The wound margin is flat and intact. There is large (67-100%) red granulation within the wound bed. There is no necrotic tissue within the wound bed. The periwound skin appearance did not exhibit: Callus, Crepitus, Excoriation, Induration, Rash, Scarring, Dry/Scaly, Maceration, Atrophie Blanche, Cyanosis, Ecchymosis, Hemosiderin Staining, Mottled, Pallor, Rubor, Erythema. Wound #2 status is Open. Original cause of wound was Gradually Appeared. The wound is located on the Right Toe Second. The wound measures 0.4cm length x 1.6cm width x 0.1cm depth; 0.503cm^2 area and 0.05cm^3 volume. There is Fat Layer (Subcutaneous Tissue) Exposed exposed. There is no tunneling or undermining noted. There is a large amount of serosanguineous drainage noted. The wound margin is flat and intact. There is large (67-100%) red granulation within the wound bed. There is a small (1-33%) amount of necrotic tissue within the wound bed including Adherent Slough. The periwound skin appearance  exhibited: Scarring, Maceration. The periwound skin appearance did not exhibit: Callus, Crepitus, Excoriation, Induration, Rash, Dry/Scaly, Atrophie Blanche, Cyanosis, Ecchymosis, Hemosiderin Staining, Mottled, Pallor, Rubor, Erythema. Periwound temperature was noted as No Abnormality. Authement, Aalijah E. ([id number]) Wound #5 status is Open. Original cause of wound was Gradually Appeared. The wound is located on the Left Toe Great. The wound measures 0.7cm length x 0.3cm width x 0.1cm depth; 0.165cm^2 area and 0.016cm^3 volume. There is Fat Layer (Subcutaneous Tissue) Exposed exposed. There is no tunneling or undermining noted. There is a large amount of serosanguineous drainage noted. Foul odor after cleansing was noted. The wound margin is flat and intact. There is medium (34-66%) red granulation within the wound bed. There is a small (1-33%) amount of necrotic tissue within the wound bed including Adherent Slough. The periwound skin appearance exhibited: Maceration. The periwound skin appearance did not exhibit: Callus, Crepitus, Excoriation, Induration, Rash, Scarring, Dry/Scaly, Atrophie Blanche, Cyanosis, Ecchymosis, Hemosiderin Staining, Mottled, Pallor, Rubor, Erythema. Periwound temperature was noted as No Abnormality. Wound #6 status is  Open. Original cause of wound was Gradually Appeared. The wound is located on the Left Toe Second. The wound measures 0.6cm length x 0.5cm width x 0.1cm depth; 0.236cm^2 area and 0.024cm^3 volume. There is Fat Layer (Subcutaneous Tissue) Exposed exposed. There is no tunneling or undermining noted. There is a large amount of serosanguineous drainage noted. The wound margin is flat and intact. There is large (67-100%) red, hyper - granulation within the wound bed. There is a small (1-33%) amount of necrotic tissue within the wound bed including Adherent Slough. The periwound skin appearance exhibited: Maceration. The periwound skin appearance did not exhibit:  Callus, Crepitus, Excoriation, Induration, Rash, Scarring, Dry/Scaly, Atrophie Blanche, Cyanosis, Ecchymosis, Hemosiderin Staining, Mottled, Pallor, Rubor, Erythema. Periwound temperature was noted as No Abnormality. Assessment Active Problems ICD-10 E11.621 - Type 2 diabetes mellitus with foot ulcer I87.313 - Chronic venous hypertension (idiopathic) with ulcer of bilateral lower extremity I89.0 - Lymphedema, not elsewhere classified L97.522 - Non-pressure chronic ulcer of other part of left foot with fat layer exposed L97.512 - Non-pressure chronic ulcer of other part of right foot with fat layer exposed F17.218 - Nicotine dependence, cigarettes, with other nicotine-induced disorders F10.19 - Alcohol abuse 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 - Chronic multifocal osteomyelitis, right ankle and foot Procedures Wound #1 Pre-procedure diagnosis of Wound #1 is a Diabetic Wound/Ulcer of the Lower Extremity located on the Right Toe Great .Severity of Tissue Pre Debridement is: Fat layer exposed. There was a Excisional Skin/Subcutaneous Tissue Debridement with a total area of 0.24 sq cm performed by Lawanda Cousins, NP. With the following instrument(s): Curette. to remove Viable and Non-Viable tissue/material Material removed includes Subcutaneous Tissue, and Slough, Fibrin/Exudate, and [place] after achieving pain control using Lidocaine 4% Topical Solution. No specimens were taken. A time out was conducted at 13:58, prior to the start of the procedure. A Minimum amount of bleeding was controlled with Pressure. The procedure was tolerated well with a pain level of 0 throughout and a pain level of 0 following the procedure. Post Debridement Measurements: 0.4cm length x 0.6cm width x 0.3cm depth; 0.057cm^3 volume. Character of Wound/Ulcer Post Debridement requires further debridement. Severity of Tissue Post Debridement is: Fat layer exposed. Post procedure Diagnosis Wound #1: Same as  Pre-Procedure Schubach, Hoyte E. ([id number]) Plan Wound Cleansing: Wound #1 Right Toe Great: Clean wound with Normal Saline. Cleanse wound with mild soap and water May Shower, gently pat wound dry prior to applying new dressing. Wound #2 Right Toe Second: Clean wound with Normal Saline. Cleanse wound with mild soap and water May Shower, gently pat wound dry prior to applying new dressing. Wound #5 Left Toe Great: Clean wound with Normal Saline. Cleanse wound with mild soap and water May Shower, gently pat wound dry prior to applying new dressing. Wound #12 Right,Dorsal Foot: Clean wound with Normal Saline. Cleanse wound with mild soap and water May Shower, gently pat wound dry prior to applying new dressing. Wound #6 Left Toe Second: Clean wound with Normal Saline. Cleanse wound with mild soap and water May Shower, gently pat wound dry prior to applying new dressing. Anesthetic (add to Medication List): Wound #1 Right Toe Great: Topical Lidocaine 4% cream applied to wound bed prior to debridement (In Clinic Only). Wound #2 Right Toe Second: Topical Lidocaine 4% cream applied to wound bed prior to debridement (In Clinic Only). Wound #5 Left Toe Great: Topical Lidocaine 4% cream applied to wound bed prior to debridement (In Clinic Only). Wound #12 Right,Dorsal Foot: Topical Lidocaine 4%  cream applied to wound bed prior to debridement (In Clinic Only). Wound #6 Left Toe Second: Topical Lidocaine 4% cream applied to wound bed prior to debridement (In Clinic Only). Primary Wound Dressing: Wound #1 Right Toe Great: Hydrafera Blue Ready Transfer Wound #2 Right Toe Second: Hydrafera Blue Ready Transfer Wound #5 Left Toe Great: Hydrafera Blue Ready Transfer Wound #12 Right,Dorsal Foot: Hydrafera Blue Ready Transfer Wound #6 Left Toe Second: Hydrafera Blue Ready Transfer Secondary Dressing: Wound #1 Right Toe Great: Dry Gauze Wound #2 Right Toe Second: Dry Gauze Wound #5 Left  Toe Great: Dry Gauze Wound #12 Right,Dorsal Foot: Dry Gauze Wound #6 Left Toe Second: Dry Gauze Kendrix, Wellington E. ([id number]) Dressing Change Frequency: Wound #1 Right Toe Great: Change dressing every day. Wound #2 Right Toe Second: Change dressing every day. Wound #5 Left Toe Great: Change dressing every day. Wound #12 Right,Dorsal Foot: Change dressing every day. Wound #6 Left Toe Second: Change dressing every day. Follow-up Appointments: Wound #1 Right Toe Great: Return Appointment in 1 week. Wound #2 Right Toe Second: Return Appointment in 1 week. Wound #5 Left Toe Great: Return Appointment in 1 week. Wound #12 Right,Dorsal Foot: Return Appointment in 1 week. Wound #6 Left Toe Second: Return Appointment in 1 week. Edema Control: Patient to wear own compression stockings Elevate legs to the level of the heart and pump ankles as often as possible Additional Orders / Instructions: Wound #1 Right Toe Great: Stop Smoking Increase protein intake. Other: - Please add vitamin A, vitamin C and zinc supplements to your diet Wound #2 Right Toe Second: Stop Smoking Increase protein intake. Other: - Please add vitamin A, vitamin C and zinc supplements to your diet Wound #5 Left Toe Great: Stop Smoking Increase protein intake. Other: - Please add vitamin A, vitamin C and zinc supplements to your diet Wound #12 Right,Dorsal Foot: Stop Smoking Increase protein intake. Other: - Please add vitamin A, vitamin C and zinc supplements to your diet Wound #6 Left Toe Second: Stop Smoking Increase protein intake. Other: - Please add vitamin A, vitamin C and zinc supplements to your diet The following medication(s) was prescribed: lidocaine topical 4 % cream 1 1 cream topical was prescribed at facility Electronic Signature(s) Signed: 07/25/2017 2:45:24 PM By: Lawanda Cousins Entered By: Lawanda Cousins on 07/25/2017 14:45:23 Oliveria, [name]  ([id number]) -------------------------------------------------------------------------------- ROS/PFSH Details Patient Name: Uppal, Juanmanuel E. Date of Service: 07/25/2017 1:15 PM Medical Record Number: [id number] Patient Account Number: [id number] Date of Birth/Sex: 04-Aug-1950 (67 y.o. M) Treating RN: Ahmed Prima Primary Care Provider: Lamonte Sakai Other Clinician: Referring Provider: Lamonte Sakai Treating Provider/Extender: Cathie Olden in Treatment: 76 Information Obtained From Patient Wound History Do you currently have one or more open woundso Yes How many open wounds do you currently haveo 6 Approximately how long have you had your woundso 3 months How have you been treating your wound(s) until nowo ointment and bandage Has your wound(s) ever healed and then re-openedo No Have you had any lab work done in the past montho No Have you tested positive for an antibiotic resistant organism (MRSA, VRE)o No Have you tested positive for osteomyelitis (bone infection)o No Have you had any tests for circulation on your legso Yes Who ordered the testo PCP Where was the test doneo [facility] Medical History: Negative for: Cataracts; Glaucoma; Optic Neuritis Ear/Nose/Mouth/Throat Medical History: Negative for: Chronic sinus problems/congestion; Middle ear problems Hematologic/Lymphatic Medical History: Positive for: Anemia; Lymphedema Negative for: Hemophilia; Human Immunodeficiency Virus; Sickle Cell Disease  Respiratory Medical History: Negative for: Aspiration; Asthma; Chronic Obstructive Pulmonary Disease (COPD); Pneumothorax; Sleep Apnea; Tuberculosis Cardiovascular Medical History: Positive for: Congestive Heart Failure; Hypertension; Peripheral Venous Disease Negative for: Angina; Arrhythmia; Coronary Artery Disease; Deep Vein Thrombosis; Hypotension; Myocardial Infarction; Peripheral Arterial Disease; Phlebitis; Vasculitis Gastrointestinal Medical History: Negative  for: Cirrhosis ; Colitis; Crohnos; Hepatitis A; Hepatitis B; Hepatitis C Boye, Jaryan E. ([id number]) Endocrine Medical History: Positive for: Type II Diabetes Treated with: Oral agents Blood sugar tested every day: Yes Tested : QD Genitourinary Medical History: Negative for: End Stage Renal Disease Immunological Medical History: Negative for: Lupus Erythematosus; Raynaudos; Scleroderma Integumentary (Skin) Medical History: Negative for: History of Burn; History of pressure wounds Musculoskeletal Medical History: Negative for: Gout; Rheumatoid Arthritis; Osteoarthritis; Osteomyelitis Neurologic Medical History: Positive for: Neuropathy Negative for: Dementia; Quadriplegia; Paraplegia; Seizure Disorder Oncologic Medical History: Past Medical History Notes: 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 Electronic Signature(s) Signed: 07/25/2017 3:41:30 PM By: Lawanda Cousins Signed: 07/25/2017 5:13:37 PM By: Warnell Bureau ([id number]) Entered By: Lawanda Cousins on 07/25/2017 14:45:02 Hensch, [name] ([id number]) -------------------------------------------------------------------------------- SuperBill Details Patient Name: Porcaro, Duard E. Date of Service: 07/25/2017 Medical Record Number: [id number] Patient Account Number: [id number] Date of Birth/Sex: October 13, 1950 (67 y.o.  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.512 Non-pressure chronic ulcer of other part of right foot with fat layer exposed F17.218 Nicotine dependence, cigarettes, 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 abuse 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, right ankle and foot Facility Procedures CPT4 Code: 82518984 Description: 21031 - DEB SUBQ TISSUE 20 SQ CM/< ICD-10 Diagnosis Description L97.512 Non-pressure chronic ulcer of other part of right foot with fat Modifier: layer exposed Quantity: 1 Physician Procedures CPT4 Code: [id number] Description: 11042 - WC PHYS SUBQ TISS 20 SQ CM ICD-10 Diagnosis Description L97.512 Non-pressure chronic ulcer of other part of right foot with fat Modifier: layer exposed Quantity: 1 Electronic Signature(s) Signed: 07/25/2017 2:45:49 PM By: Lawanda Cousins Entered By: Lawanda Cousins on 07/25/2017 14:45:49

## 2017-07-30 NOTE — Progress Notes (Signed)
SHARIFF, LASKY ([id number]) Visit Report for 07/25/2017 Arrival Information Details Patient Name: Cubillos, XZAVIEN HARADA. Date of Service: 07/25/2017 1:15 PM Medical Record Number: [id number] Patient Account Number: [id number] Date of Birth/Sex: March 17, 1951 (67 y.o. M) Treating RN: Montey Hora Primary Care Aaryav Hopfensperger: Lamonte Sakai Other Clinician: Referring Jennett Tarbell: Lamonte Sakai Treating Haroun Cotham/Extender: Cathie Olden in Treatment: 48 Visit Information History Since Last Visit All ordered tests and consults were completed: No Patient Arrived: Ambulatory Added or deleted any medications: No Arrival Time: 13:16 Any new allergies or adverse reactions: No Accompanied By: brother Had a fall or experienced change in No Transfer Assistance: None activities of daily living that may affect Patient Identification Verified: Yes risk of falls: Secondary Verification Process Completed: Yes Signs or symptoms of abuse/neglect since last visito No Patient Requires Transmission-Based No Hospitalized since last visit: No Precautions: Implantable device outside of the clinic excluding No Patient Has Alerts: Yes cellular tissue based products placed in the center Patient Alerts: DMII since last visit: Pain Present Now: No Electronic Signature(s) Signed: 07/25/2017 5:09:22 PM By: Montey Hora Entered By: Montey Hora on 07/25/2017 13:16:21 Bendorf, [name] ([id number]) -------------------------------------------------------------------------------- Encounter Discharge Information Details Patient Name: Kleen, Lynda E. Date of Service: 07/25/2017 1:15 PM Medical Record Number: [id number] Patient Account Number: [id number] Date of Birth/Sex: 1950/07/15 (67 y.o. M) Treating RN: Ahmed Prima Primary Care Trek Kimball: Lamonte Sakai Other Clinician: Referring Taiana Temkin: Lamonte Sakai Treating Jonathan Corpus/Extender: Cathie Olden in Treatment: 62 Encounter Discharge Information Items Discharge Pain  Level: 0 Discharge Condition: Stable Ambulatory Status: Ambulatory Discharge Destination: Home Transportation: Private Auto Schedule Follow-up Appointment: No Medication Reconciliation completed and No provided to Patient/Care Margarethe Virgen: Provided on Clinical Summary of Care: 07/25/2017 Form Type Recipient Paper Patient ED Electronic Signature(s) Signed: 07/25/2017 3:01:38 PM By: Roger Shelter Entered By: Roger Shelter on 07/25/2017 14:50:05 Gram, [name] ([id number]) -------------------------------------------------------------------------------- Lower Extremity Assessment Details Patient Name: CHAISE, MAHABIR. Date of Service: 07/25/2017 1:15 PM Medical Record Number: [id number] Patient Account Number: [id number] Date of Birth/Sex: Aug 25, 1950 (67 y.o. M) Treating RN: Montey Hora Primary Care Bronwen Pendergraft: Lamonte Sakai Other Clinician: Referring Odelia Graciano: Lamonte Sakai Treating Mikhail Hallenbeck/Extender: Cathie Olden in Treatment: 43 Edema Assessment Assessed: [Left: No] [Right: No] [Left: Edema] [Right: :] Calf Left: Right: Point of Measurement: 34 cm From Medial Instep 34.5 cm 34.9 cm Ankle Left: Right: Point of Measurement: 12 cm From Medial Instep 25.6 cm 25.4 cm Vascular Assessment Pulses: Dorsalis Pedis Palpable: [Left:Yes] [Right:Yes] Posterior Tibial Extremity colors, hair growth, and conditions: Extremity Color: [Left:Hyperpigmented] [Right:Hyperpigmented] Hair Growth on Extremity: [Left:No] [Right:No] Temperature of Extremity: [Left:Warm] [Right:Warm] Capillary Refill: [Left:< 3 seconds] [Right:< 3 seconds] Electronic Signature(s) Signed: 07/25/2017 5:09:22 PM By: Montey Hora Entered By: Montey Hora on 07/25/2017 13:29:57 Koch, [name] ([id number]) -------------------------------------------------------------------------------- Multi Wound Chart Details Patient Name: Ebanks, Callaway E. Date of Service: 07/25/2017 1:15 PM Medical Record Number:  [id number] Patient Account Number: [id number] Date of Birth/Sex: 03/21/1951 (67 y.o. M) Treating RN: Ahmed Prima Primary Care Skylor Schnapp: Lamonte Sakai Other Clinician: Referring Kandice Schmelter: Lamonte Sakai Treating Deshawna Mcneece/Extender: Cathie Olden in Treatment: 86 Vital Signs Height(in): 26 Pulse(bpm): 25 Weight(lbs): 168 Blood Pressure(mmHg): 152/83 Body Mass Index(BMI): 25 Temperature(F): 98.0 Respiratory Rate 16 (breaths/min): Photos: [1:No Photos] [12:No Photos] Wound Location: [1:Right Toe Great] [12:Right Foot - Dorsal] Wounding Event: [1:Gradually Appeared] [12:Gradually Appeared] Primary Etiology: [1:Diabetic Wound/Ulcer of the Lower Extremity] [12:Diabetic Wound/Ulcer of the Lower Extremity] Comorbid History: [1:Anemia, Lymphedema, Congestive Heart Failure, Hypertension, Peripheral Venous Disease, Type II Diabetes, Neuropathy] [12:Anemia, Lymphedema, Congestive Heart Failure,  Hypertension, Peripheral Venous Disease, Type II Diabetes,  Neuropathy] Date Acquired: [1:06/11/2016] [12:07/25/2017] Weeks of Treatment: [1:43] [12:0] Wound Status: [1:Open] [12:Open] Pending Amputation on [1:Yes] [12:No] Presentation: Measurements L x W x D [1:0.4x0.6x0.2] [12:0.6x0.5x0.1] (cm) Area (cm) : [1:0.188] [12:0.236] Volume (cm) : [1:0.038] [12:0.024] % Reduction in Area: [1:99.60%] [12:N/A] % Reduction in Volume: [1:99.20%] [12:N/A] Classification: [1:Grade 2] [12:Grade 1] Exudate Amount: [1:Large] [12:Medium] Exudate Type: [1:Serosanguineous] [12:Serous] Exudate Color: [1:red, brown] [12:amber] Foul Odor After Cleansing: [1:No] [12:No] Odor Anticipated Due to [1:N/A] [12:N/A] Product Use: Wound Margin: [1:Flat and Intact] [12:Flat and Intact] Granulation Amount: [1:Large (67-100%)] [12:Large (67-100%)] Granulation Quality: [1:Red] [12:Red] Necrotic Amount: [1:Small (1-33%)] [12:None Present (0%)] Necrotic Tissue: [1:Eschar, Adherent Slough] [12:N/A] Exposed  Structures: [1:Fat Layer (Subcutaneous Tissue) Exposed: Yes Fascia: No Tendon: No] [12:Fascia: No Fat Layer (Subcutaneous Tissue) Exposed: No Tendon: No] Muscle: No Muscle: No Muscle: No Joint: No Joint: No Joint: No Bone: No Bone: No Bone: No Epithelialization: Small (1-33%) Medium (34-66%) Medium (34-66%) Debridement: Debridement - Excisional N/A N/A Pre-procedure 13:58 N/A N/A Verification/Time Out Taken: Pain Control: Lidocaine 4% Topical Solution N/A N/A Tissue Debrided: Subcutaneous, Slough N/A N/A Level: Skin/Subcutaneous Tissue N/A N/A Debridement Area (sq cm): 0.24 N/A N/A Instrument: Curette N/A N/A Bleeding: Minimum N/A N/A Hemostasis Achieved: Pressure N/A N/A Procedural Pain: 0 N/A N/A Post Procedural Pain: 0 N/A N/A Debridement Treatment Procedure was tolerated well N/A N/A Response: Post Debridement 0.4x0.6x0.3 N/A N/A Measurements L x W x D (cm) Post Debridement Volume: 0.057 N/A N/A (cm) Periwound Skin Texture: Excoriation: Yes Excoriation: No Scarring: Yes Callus: Yes Induration: No Excoriation: No Induration: No Callus: No Induration: No Crepitus: No Crepitus: No Callus: No Rash: No Rash: No Crepitus: No Scarring: No Scarring: No Rash: No Periwound Skin Moisture: Maceration: Yes Maceration: No Maceration: Yes Dry/Scaly: No Dry/Scaly: No Dry/Scaly: No Periwound Skin Color: Atrophie Blanche: No Atrophie Blanche: No Atrophie Blanche: No Cyanosis: No Cyanosis: No Cyanosis: No Ecchymosis: No Ecchymosis: No Ecchymosis: No Erythema: No Erythema: No Erythema: No Hemosiderin Staining: No Hemosiderin Staining: No Hemosiderin Staining: No Mottled: No Mottled: No Mottled: No Pallor: No Pallor: No Pallor: No Rubor: No Rubor: No Rubor: No Temperature: No Abnormality N/A No Abnormality Tenderness on Palpation: No No No Wound Preparation: Ulcer Cleansing: Ulcer Cleansing: Ulcer Cleansing: Rinsed/Irrigated with Saline  Rinsed/Irrigated with Saline Rinsed/Irrigated with Saline Topical Anesthetic Applied: Topical Anesthetic Applied: Topical Anesthetic Applied: Other: lidocaine 4% None Other: lidocaine 4% Procedures Performed: Debridement N/A N/A Wound Number: 5 6 N/A Photos: No Photos No Photos N/A Wound Location: Left Toe Great Left Toe Second N/A Wounding Event: Gradually Appeared Gradually Appeared N/A Primary Etiology: Diabetic Wound/Ulcer of the Diabetic Wound/Ulcer of the N/A Lower Extremity Lower Extremity Comorbid History: Anemia, Lymphedema, Anemia, Lymphedema, N/A Congestive Heart Failure, Congestive Heart Failure, Hypertension, Peripheral Hypertension, Peripheral Zartman, Christo E. ([id number]) Venous Disease, Type II Venous Disease, Type II Diabetes, Neuropathy Diabetes, Neuropathy Date Acquired: 06/11/2016 06/11/2016 N/A Weeks of Treatment: 43 43 N/A Wound Status: Open Open N/A Pending Amputation on Yes Yes N/A Presentation: Measurements L x W x D 0.7x0.3x0.1 0.6x0.5x0.1 N/A (cm) Area (cm) : 0.165 0.236 N/A Volume (cm) : 0.016 0.024 N/A % Reduction in Area: 98.80% 89.00% N/A % Reduction in Volume: 98.90% 88.80% N/A Classification: Grade 2 Grade 2 N/A Exudate Amount: Large Large N/A Exudate Type: Serosanguineous Serosanguineous N/A Exudate Color: red, brown red, brown N/A Foul Odor After Cleansing: Yes No N/A Odor Anticipated Due to No N/A N/A Product Use: Wound Margin: Flat and  Intact Flat and Intact N/A Granulation Amount: Medium (34-66%) Large (67-100%) N/A Granulation Quality: Red Red, Hyper-granulation N/A Necrotic Amount: Small (1-33%) Small (1-33%) N/A Necrotic Tissue: Adherent Faxon N/A Exposed Structures: Fat Layer (Subcutaneous Fat Layer (Subcutaneous N/A Tissue) Exposed: Yes Tissue) Exposed: Yes Fascia: No Fascia: No Tendon: No Tendon: No Muscle: No Muscle: No Joint: No Joint: No Bone: No Bone: No Epithelialization: Small (1-33%) Small (1-33%)  N/A Debridement: N/A Debridement - Excisional N/A Pre-procedure N/A 13:58 N/A Verification/Time Out Taken: Pain Control: N/A Lidocaine 4% Topical Solution N/A Tissue Debrided: N/A Subcutaneous, Slough N/A Level: N/A Skin/Subcutaneous Tissue N/A Debridement Area (sq cm): N/A 0.3 N/A Instrument: N/A Curette N/A Bleeding: N/A Minimum N/A Hemostasis Achieved: N/A Pressure N/A Procedural Pain: N/A 0 N/A Post Procedural Pain: N/A 0 N/A Debridement Treatment N/A Procedure was tolerated well N/A Response: Post Debridement N/A 0.6x0.5x0.2 N/A Measurements L x W x D (cm) Post Debridement Volume: N/A 0.047 N/A (cm) Periwound Skin Texture: Excoriation: No Excoriation: No N/A Induration: No Induration: No Callus: No Callus: No Crepitus: No Crepitus: No Brunetto, Shraga E. ([id number]) Rash: No Rash: No Scarring: No Scarring: No Periwound Skin Moisture: Maceration: Yes Maceration: Yes N/A Dry/Scaly: No Dry/Scaly: No Periwound Skin Color: Atrophie Blanche: No Atrophie Blanche: No N/A Cyanosis: No Cyanosis: No Ecchymosis: No Ecchymosis: No Erythema: No Erythema: No Hemosiderin Staining: No Hemosiderin Staining: No Mottled: No Mottled: No Pallor: No Pallor: No Rubor: No Rubor: No Temperature: No Abnormality No Abnormality N/A Tenderness on Palpation: No No N/A Wound Preparation: Ulcer Cleansing: Ulcer Cleansing: N/A Rinsed/Irrigated with Saline Rinsed/Irrigated with Saline Topical Anesthetic Applied: Topical Anesthetic Applied: Other: lidocaine 4% Other: lidocaine 4% Procedures Performed: N/A Debridement N/A Treatment Notes Electronic Signature(s) Signed: 07/25/2017 2:42:32 PM By: Lawanda Cousins Previous Signature: 07/25/2017 2:36:09 PM Version By: Lawanda Cousins Entered By: Lawanda Cousins on 07/25/2017 14:42:32 Gohlke, [name] ([id number]) -------------------------------------------------------------------------------- [place] Details Patient  Name: Nola, Odis E. Date of Service: 07/25/2017 1:15 PM Medical Record Number: [id number] Patient Account Number: [id number] Date of Birth/Sex: 10-31-1950 (67 y.o. M) Treating RN: Ahmed Prima Primary Care Keziyah Kneale: Lamonte Sakai Other Clinician: Referring Bethel Sirois: Lamonte Sakai Treating Clifford Benninger/Extender: Cathie Olden in Treatment: 67 Active Inactive ` Abuse / Safety / Falls / Self Care Management Nursing Diagnoses: Potential for falls Goals: Patient will remain injury free related to falls Date Initiated: 09/21/2016 Target Resolution Date: 09/14/2017 Goal Status: Active Interventions: Assess fall risk on admission and as needed Notes: ` Nutrition Nursing Diagnoses: Potential for alteratiion in Nutrition/Potential for imbalanced nutrition Goals: Patient/caregiver agrees to and verbalizes understanding of need to use nutritional supplements and/or vitamins as prescribed Date Initiated: 09/21/2016 Target Resolution Date: 09/14/2017 Goal Status: Active Interventions: Assess patient nutrition upon admission and as needed per policy Notes: ` Orientation to the Wound Care Program Nursing Diagnoses: Knowledge deficit related to the wound healing center program Goals: Patient/caregiver will verbalize understanding of the [facility] Date Initiated: 09/21/2016 Target Resolution Date: 06/15/2017 Goal Status: Active Interventions: DARKFederick, Levene ([id number]) Provide education on orientation to the wound center Notes: ` Wound/Skin Impairment Nursing Diagnoses: Knowledge deficit related to smoking impact on wound healing Goals: Ulcer/skin breakdown will have a volume reduction of 30% by week 4 Date Initiated: 09/21/2016 Target Resolution Date: 08/17/2017 Goal Status: Active Ulcer/skin breakdown will have a volume reduction of 50% by week 8 Date Initiated: 09/21/2016 Target Resolution Date: 08/17/2017 Goal Status: Active Ulcer/skin breakdown will have a  volume reduction of 80% by week 12 Date  Initiated: 09/21/2016 Target Resolution Date: 09/14/2017 Goal Status: Active Ulcer/skin breakdown will heal within 14 weeks Date Initiated: 09/21/2016 Target Resolution Date: 08/17/2017 Goal Status: Active Interventions: Assess patient/caregiver ability to obtain necessary supplies Assess patient/caregiver ability to perform ulcer/skin care regimen upon admission and as needed Assess ulceration(s) every visit Notes: Electronic Signature(s) Signed: 07/25/2017 5:13:37 PM By: Alric Quan Entered By: Alric Quan on 07/25/2017 13:58:35 Cuellar, [name] ([id number]) -------------------------------------------------------------------------------- Pain Assessment Details Patient Name: Kowalchuk, Tom E. Date of Service: 07/25/2017 1:15 PM Medical Record Number: [id number] Patient Account Number: [id number] Date of Birth/Sex: [birth year] (67 y.o. M) Treating RN: Montey Hora Primary Care Tashena Ibach: Lamonte Sakai Other Clinician: Referring Jazmen Lindenbaum: Lamonte Sakai Treating Kolbe Delmonaco/Extender: Cathie Olden in Treatment: 56 Active Problems Location of Pain Severity and Description of Pain Patient Has Paino No Site Locations Pain Management and Medication Current Pain Management: Electronic Signature(s) Signed: 07/25/2017 5:09:22 PM By: Montey Hora Entered By: Montey Hora on 07/25/2017 13:16:28 Lannom, [name] ([id number]) -------------------------------------------------------------------------------- Patient/Caregiver Education Details Patient Name: Cantero, Shayden E. Date of Service: 07/25/2017 1:15 PM Medical Record Number: [id number] Patient Account Number: [id number] Date of Birth/Gender: 07-30-1950 (67 y.o. M) Treating RN: Roger Shelter Primary Care Physician: Lamonte Sakai Other Clinician: Referring Physician: Lamonte Sakai Treating Physician/Extender: Cathie Olden in Treatment: 39 Education Assessment Education Provided  To: Patient Education Topics Provided Wound Debridement: Handouts: Wound Debridement Methods: Explain/Verbal Responses: State content correctly Wound/Skin Impairment: Handouts: Caring for Your Ulcer Methods: Explain/Verbal Responses: State content correctly Electronic Signature(s) Signed: 07/25/2017 3:01:38 PM By: Roger Shelter Entered By: Roger Shelter on 07/25/2017 14:50:22 Waynick, [name] ([id number]) -------------------------------------------------------------------------------- Wound Assessment Details Patient Name: Mosso, Joachim E. Date of Service: 07/25/2017 1:15 PM Medical Record Number: [id number] Patient Account Number: [id number] Date of Birth/Sex: Nov 11, 1950 (67 y.o. M) Treating RN: Montey Hora Primary Care Azie Mcconahy: Lamonte Sakai Other Clinician: Referring Eriverto Byrnes: Lamonte Sakai Treating Newton Frutiger/Extender: Cathie Olden in Treatment: 43 Wound Status Wound Number: 1 Primary Diabetic Wound/Ulcer of the Lower Extremity Etiology: Wound Location: Right Toe Great Wound Open Wounding Event: Gradually Appeared Status: Date Acquired: 06/11/2016 Comorbid Anemia, Lymphedema, Congestive Heart Weeks Of Treatment: 43 History: Failure, Hypertension, Peripheral Venous Clustered Wound: No Disease, Type II Diabetes, Neuropathy Pending Amputation On Presentation Photos Photo Uploaded By: Montey Hora on 07/25/2017 15:05:52 Wound Measurements Length: (cm) 0.4 Width: (cm) 0.6 Depth: (cm) 0.2 Area: (cm) 0.188 Volume: (cm) 0.038 % Reduction in Area: 99.6% % Reduction in Volume: 99.2% Epithelialization: Small (1-33%) Tunneling: No Undermining: No Wound Description Classification: Grade 2 Wound Margin: Flat and Intact Exudate Amount: Large Exudate Type: Serosanguineous Exudate Color: red, brown Foul Odor After Cleansing: No Slough/Fibrino No Wound Bed Granulation Amount: Large (67-100%) Exposed Structure Granulation Quality: Red Fascia Exposed:  No Necrotic Amount: Small (1-33%) Fat Layer (Subcutaneous Tissue) Exposed: Yes Necrotic Quality: Eschar, Adherent Slough Tendon Exposed: No Muscle Exposed: No Joint Exposed: No Bone Exposed: No Periwound Skin Texture Texture Color No Abnormalities Noted: No No Abnormalities Noted: No Zane, Lenny E. ([id number]) Callus: Yes Atrophie Blanche: No Crepitus: No Cyanosis: No Excoriation: Yes Ecchymosis: No Induration: No Erythema: No Rash: No Hemosiderin Staining: No Scarring: No Mottled: No Pallor: No Moisture Rubor: No No Abnormalities Noted: No Dry / Scaly: No Temperature / Pain Maceration: Yes Temperature: No Abnormality Wound Preparation Ulcer Cleansing: Rinsed/Irrigated with Saline Topical Anesthetic Applied: Other: lidocaine 4%, Treatment Notes Wound #1 (Right Toe Great) 1. Cleansed with: Clean wound with Normal Saline 2. Anesthetic Topical Lidocaine 4% cream to wound bed prior to debridement 4. Dressing  Applied: Hydrafera Blue 5. Secondary Dressing Applied Dry Gauze Notes tape Electronic Signature(s) Signed: 07/25/2017 5:09:22 PM By: Montey Hora Entered By: Montey Hora on 07/25/2017 13:25:06 Newlun, [name] ([id number]) -------------------------------------------------------------------------------- Wound Assessment Details Patient Name: Mates, Graeden E. Date of Service: 07/25/2017 1:15 PM Medical Record Number: [id number] Patient Account Number: [id number] Date of Birth/Sex: January 10, 1951 (68 y.o. M) Treating RN: Montey Hora Primary Care Prakriti Carignan: Lamonte Sakai Other Clinician: Referring Gahel Safley: Lamonte Sakai Treating Krysteena Stalker/Extender: Cathie Olden in Treatment: 43 Wound Status Wound Number: 12 Primary Diabetic Wound/Ulcer of the Lower Extremity Etiology: Wound Location: Right Foot - Dorsal Wound Open Wounding Event: Gradually Appeared Status: Date Acquired: 07/25/2017 Comorbid Anemia, Lymphedema, Congestive Heart Weeks Of Treatment:  0 History: Failure, Hypertension, Peripheral Venous Clustered Wound: No Disease, Type II Diabetes, Neuropathy Photos Photo Uploaded By: Montey Hora on 07/25/2017 15:05:53 Wound Measurements Length: (cm) 0.6 Width: (cm) 0.5 Depth: (cm) 0.1 Area: (cm) 0.236 Volume: (cm) 0.024 % Reduction in Area: % Reduction in Volume: Epithelialization: Medium (34-66%) Tunneling: No Undermining: No Wound Description Classification: Grade 1 Wound Margin: Flat and Intact Exudate Amount: Medium Exudate Type: Serous Exudate Color: amber Foul Odor After Cleansing: No Slough/Fibrino No Wound Bed Granulation Amount: Large (67-100%) Exposed Structure Granulation Quality: Red Fascia Exposed: No Necrotic Amount: None Present (0%) Fat Layer (Subcutaneous Tissue) Exposed: No Tendon Exposed: No Muscle Exposed: No Joint Exposed: No Bone Exposed: No Periwound Skin Texture Texture Color No Abnormalities Noted: No No Abnormalities Noted: No Pavlak, Shawntez E. ([id number]) Callus: No Atrophie Blanche: No Crepitus: No Cyanosis: No Excoriation: No Ecchymosis: No Induration: No Erythema: No Rash: No Hemosiderin Staining: No Scarring: No Mottled: No Pallor: No Moisture Rubor: No No Abnormalities Noted: No Dry / Scaly: No Maceration: No Wound Preparation Ulcer Cleansing: Rinsed/Irrigated with Saline Topical Anesthetic Applied: None Treatment Notes Wound #12 (Right, Dorsal Foot) 1. Cleansed with: Clean wound with Normal Saline 2. Anesthetic Topical Lidocaine 4% cream to wound bed prior to debridement 4. Dressing Applied: Hydrafera Blue 5. Secondary Dressing Applied Dry Gauze Notes tape Electronic Signature(s) Signed: 07/25/2017 5:09:22 PM By: Montey Hora Entered By: Montey Hora on 07/25/2017 13:29:18 Stoiber, [name] ([id number]) -------------------------------------------------------------------------------- Wound Assessment Details Patient Name: Geraci, Ron E. Date of  Service: 07/25/2017 1:15 PM Medical Record Number: [id number] Patient Account Number: [id number] Date of Birth/Sex: 05-03-1950 (67 y.o. M) Treating RN: Montey Hora Primary Care Daily Doe: Lamonte Sakai Other Clinician: Referring Avry Monteleone: Lamonte Sakai Treating Lanier Felty/Extender: Cathie Olden in Treatment: 43 Wound Status Wound Number: 2 Primary Diabetic Wound/Ulcer of the Lower Extremity Etiology: Wound Location: Right Toe Second Wound Open Wounding Event: Gradually Appeared Status: Date Acquired: 06/11/2016 Comorbid Anemia, Lymphedema, Congestive Heart Weeks Of Treatment: 43 History: Failure, Hypertension, Peripheral Venous Clustered Wound: No Disease, Type II Diabetes, Neuropathy Pending Amputation On Presentation Photos Photo Uploaded By: Montey Hora on 07/25/2017 15:06:27 Wound Measurements Length: (cm) 0.4 Width: (cm) 1.6 Depth: (cm) 0.1 Area: (cm) 0.503 Volume: (cm) 0.05 % Reduction in Area: 83.6% % Reduction in Volume: 83.7% Epithelialization: Medium (34-66%) Tunneling: No Undermining: No Wound Description Classification: Grade 2 Wound Margin: Flat and Intact Exudate Amount: Large Exudate Type: Serosanguineous Exudate Color: red, brown Foul Odor After Cleansing: No Slough/Fibrino No Wound Bed Granulation Amount: Large (67-100%) Exposed Structure Granulation Quality: Red Fascia Exposed: No Necrotic Amount: Small (1-33%) Fat Layer (Subcutaneous Tissue) Exposed: Yes Necrotic Quality: Adherent Slough Tendon Exposed: No Muscle Exposed: No Joint Exposed: No Bone Exposed: No Periwound Skin Texture Texture Color No Abnormalities Noted: No No Abnormalities Noted: No  KY[facility], FENTER ([id number]) Callus: No Atrophie Blanche: No Crepitus: No Cyanosis: No Excoriation: No Ecchymosis: No Induration: No Erythema: No Rash: No Hemosiderin Staining: No Scarring: Yes Mottled: No Pallor: No Moisture Rubor: No No Abnormalities Noted: No Dry /  Scaly: No Temperature / Pain Maceration: Yes Temperature: No Abnormality Wound Preparation Ulcer Cleansing: Rinsed/Irrigated with Saline Topical Anesthetic Applied: Other: lidocaine 4%, Treatment Notes Wound #2 (Right Toe Second) 1. Cleansed with: Clean wound with Normal Saline 2. Anesthetic Topical Lidocaine 4% cream to wound bed prior to debridement 4. Dressing Applied: Hydrafera Blue 5. Secondary Dressing Applied Dry Gauze Notes tape Electronic Signature(s) Signed: 07/25/2017 5:09:22 PM By: Montey Hora Entered By: Montey Hora on 07/25/2017 13:26:00 Schlotzhauer, [name] ([id number]) -------------------------------------------------------------------------------- Wound Assessment Details Patient Name: Gidney, Navin E. Date of Service: 07/25/2017 1:15 PM Medical Record Number: [id number] Patient Account Number: [id number] Date of Birth/Sex: 18-Apr-1950 (67 y.o. M) Treating RN: Montey Hora Primary Care Tamisha Nordstrom: Lamonte Sakai Other Clinician: Referring Elgie Maziarz: Lamonte Sakai Treating Geneveive Furness/Extender: Cathie Olden in Treatment: 43 Wound Status Wound Number: 5 Primary Diabetic Wound/Ulcer of the Lower Extremity Etiology: Wound Location: Left Toe Great Wound Open Wounding Event: Gradually Appeared Status: Date Acquired: 06/11/2016 Comorbid Anemia, Lymphedema, Congestive Heart Weeks Of Treatment: 43 History: Failure, Hypertension, Peripheral Venous Clustered Wound: No Disease, Type II Diabetes, Neuropathy Pending Amputation On Presentation Photos Photo Uploaded By: Montey Hora on 07/25/2017 15:06:28 Wound Measurements Length: (cm) 0.7 Width: (cm) 0.3 Depth: (cm) 0.1 Area: (cm) 0.165 Volume: (cm) 0.016 % Reduction in Area: 98.8% % Reduction in Volume: 98.9% Epithelialization: Small (1-33%) Tunneling: No Undermining: No Wound Description Classification: Grade 2 Wound Margin: Flat and Intact Exudate Amount: Large Exudate Type:  Serosanguineous Exudate Color: red, brown Foul Odor After Cleansing: Yes Due to Product Use: No Slough/Fibrino No Wound Bed Granulation Amount: Medium (34-66%) Exposed Structure Granulation Quality: Red Fascia Exposed: No Necrotic Amount: Small (1-33%) Fat Layer (Subcutaneous Tissue) Exposed: Yes Necrotic Quality: Adherent Slough Tendon Exposed: No Muscle Exposed: No Joint Exposed: No Bone Exposed: No Periwound Skin Texture Texture Color No Abnormalities Noted: No No Abnormalities Noted: No Babineaux, Kayn E. ([id number]) Callus: No Atrophie Blanche: No Crepitus: No Cyanosis: No Excoriation: No Ecchymosis: No Induration: No Erythema: No Rash: No Hemosiderin Staining: No Scarring: No Mottled: No Pallor: No Moisture Rubor: No No Abnormalities Noted: No Dry / Scaly: No Temperature / Pain Maceration: Yes Temperature: No Abnormality Wound Preparation Ulcer Cleansing: Rinsed/Irrigated with Saline Topical Anesthetic Applied: Other: lidocaine 4%, Treatment Notes Wound #5 (Left Toe Great) 1. Cleansed with: Clean wound with Normal Saline 2. Anesthetic Topical Lidocaine 4% cream to wound bed prior to debridement 4. Dressing Applied: Hydrafera Blue 5. Secondary Dressing Applied Dry Gauze Notes tape Electronic Signature(s) Signed: 07/25/2017 5:09:22 PM By: Montey Hora Entered By: Montey Hora on 07/25/2017 13:26:14 Earnest, [name] ([id number]) -------------------------------------------------------------------------------- Wound Assessment Details Patient Name: Salberg, Dejon E. Date of Service: 07/25/2017 1:15 PM Medical Record Number: [id number] Patient Account Number: [id number] Date of Birth/Sex: 07/18/1950 (67 y.o. M) Treating RN: Montey Hora Primary Care Lynn Recendiz: Lamonte Sakai Other Clinician: Referring Tyrone Balash: Lamonte Sakai Treating Christophe Rising/Extender: Cathie Olden in Treatment: 43 Wound Status Wound Number: 6 Primary Diabetic Wound/Ulcer of the  Lower Extremity Etiology: Wound Location: Left Toe Second Wound Open Wounding Event: Gradually Appeared Status: Date Acquired: 06/11/2016 Comorbid Anemia, Lymphedema, Congestive Heart Weeks Of Treatment: 43 History: Failure, Hypertension, Peripheral Venous Clustered Wound: No Disease, Type II Diabetes, Neuropathy Pending Amputation On Presentation Photos Photo Uploaded By: Montey Hora on  07/25/2017 15:06:45 Wound Measurements Length: (cm) 0.6 Width: (cm) 0.5 Depth: (cm) 0.1 Area: (cm) 0.236 Volume: (cm) 0.024 % Reduction in Area: 89% % Reduction in Volume: 88.8% Epithelialization: Small (1-33%) Tunneling: No Undermining: No Wound Description Classification: Grade 2 Wound Margin: Flat and Intact Exudate Amount: Large Exudate Type: Serosanguineous Exudate Color: red, brown Foul Odor After Cleansing: No Slough/Fibrino No Wound Bed Granulation Amount: Large (67-100%) Exposed Structure Granulation Quality: Red, Hyper-granulation Fascia Exposed: No Necrotic Amount: Small (1-33%) Fat Layer (Subcutaneous Tissue) Exposed: Yes Necrotic Quality: Adherent Slough Tendon Exposed: No Muscle Exposed: No Joint Exposed: No Bone Exposed: No Periwound Skin Texture Texture Color No Abnormalities Noted: No No Abnormalities Noted: No Terpstra, Gabrian E. ([id number]) Callus: No Atrophie Blanche: No Crepitus: No Cyanosis: No Excoriation: No Ecchymosis: No Induration: No Erythema: No Rash: No Hemosiderin Staining: No Scarring: No Mottled: No Pallor: No Moisture Rubor: No No Abnormalities Noted: No Dry / Scaly: No Temperature / Pain Maceration: Yes Temperature: No Abnormality Wound Preparation Ulcer Cleansing: Rinsed/Irrigated with Saline Topical Anesthetic Applied: Other: lidocaine 4%, Treatment Notes Wound #6 (Left Toe Second) 1. Cleansed with: Clean wound with Normal Saline 2. Anesthetic Topical Lidocaine 4% cream to wound bed prior to debridement 4. Dressing  Applied: Hydrafera Blue 5. Secondary Dressing Applied Dry Gauze Notes tape Electronic Signature(s) Signed: 07/25/2017 5:09:22 PM By: Montey Hora Entered By: Montey Hora on 07/25/2017 13:26:29 Krotz, [name] ([id number]) -------------------------------------------------------------------------------- [facility] Details Patient Name: Strohm, Kayton E. Date of Service: 07/25/2017 1:15 PM Medical Record Number: [id number] Patient Account Number: [id number] Date of Birth/Sex: January 20, 1951 (67 y.o. M) Treating RN: Montey Hora Primary Care Flornce Record: Lamonte Sakai Other Clinician: Referring Yaremi Stahlman: Lamonte Sakai Treating Shaunita Seney/Extender: Cathie Olden in Treatment: 58 Vital Signs Time Taken: 13:16 Temperature (F): 98.0 Height (in): 69 Pulse (bpm): 67 Weight (lbs): 168 Respiratory Rate (breaths/min): 16 Body Mass Index (BMI): 24.8 Blood Pressure (mmHg): 152/83 Reference Range: 80 - 120 mg / dl Electronic Signature(s) Signed: 07/25/2017 5:09:22 PM By: Montey Hora Entered By: Montey Hora on 07/25/2017 13:17:30

## 2017-08-01 ENCOUNTER — Encounter: Payer: Medicare HMO | Admitting: Nurse Practitioner

## 2017-08-01 DIAGNOSIS — E11621 Type 2 diabetes mellitus with foot ulcer: Secondary | ICD-10-CM | POA: Diagnosis not present

## 2017-08-05 DIAGNOSIS — E11621 Type 2 diabetes mellitus with foot ulcer: Secondary | ICD-10-CM | POA: Diagnosis not present

## 2017-08-06 NOTE — Progress Notes (Signed)
DEZ, STAUFFER ([id number]) Visit Report for 08/01/2017 Chief Complaint Document Details Patient Name: [name], [name] Date of Service: 08/01/2017 9:15 AM Medical Record Number: [id number] Patient Account Number: [id number] Date of Birth/Sex: [birth year] (67 y.o. M) Treating RN: Ahmed Prima Primary Care Provider: Lamonte Sakai Other Clinician: Referring Provider: Lamonte Sakai Treating Provider/Extender: Cathie Olden in Treatment: 27 Information Obtained from: Patient Chief Complaint Patient presents for treatment of an open diabetic ulcer to both feet Electronic Signature(s) Signed: 08/01/2017 10:21:23 AM By: Lawanda Cousins Entered By: Lawanda Cousins on 08/01/2017 10:21:23 Sonoda, [name] ([id number]) -------------------------------------------------------------------------------- Debridement Details Patient Name: [name], [name] Date of Service: 08/01/2017 9:15 AM Medical Record Number: [id number] Patient Account Number: [id number] Date of Birth/Sex: [birth year] (67 y.o. M) Treating RN: Ahmed Prima Primary Care Provider: Lamonte Sakai Other Clinician: Referring Provider: Lamonte Sakai Treating Provider/Extender: Cathie Olden in Treatment: 44 Debridement Performed for Wound #1 Right Toe Great Assessment: Performed By: Physician Lawanda Cousins, NP Debridement Type: Debridement Severity of Tissue Pre Fat layer exposed Debridement: Pre-procedure Verification/Time Yes - 09:50 Out Taken: Start Time: 09:50 Pain Control: Lidocaine 4% Topical Solution Total Area Debrided (L x W): 0.4 (cm) x 0.5 (cm) = 0.2 (cm) Tissue and other material Viable, Non-Viable, Slough, Subcutaneous, Fibrin/Exudate, Slough debrided: Level: Skin/Subcutaneous Tissue Debridement Description: Excisional Instrument: Curette Bleeding: Minimum Hemostasis Achieved: Pressure End Time: 09:51 Procedural Pain: 0 Post Procedural Pain: 0 Response to Treatment: Procedure was tolerated well Post  Debridement Measurements of Total Wound Length: (cm) 0.4 Width: (cm) 0.5 Depth: (cm) 0.2 Volume: (cm) 0.031 Character of Wound/Ulcer Post Debridement: Requires Further Debridement Severity of Tissue Post Debridement: Fat layer exposed Post Procedure Diagnosis Same as Pre-procedure Electronic Signature(s) Signed: 08/01/2017 5:04:33 PM By: Lawanda Cousins Signed: 08/02/2017 4:29:16 PM By: Alric Quan Entered By: Alric Quan on 08/01/2017 09:52:01 [name], [name] ([id number]) -------------------------------------------------------------------------------- HPI Details Patient Name: [name], [name] Date of Service: 08/01/2017 9:15 AM Medical Record Number: [id number] Patient Account Number: [id number] Date of Birth/Sex: 05-07-1950 (67 y.o. M) Treating RN: Ahmed Prima Primary Care Provider: Lamonte Sakai Other Clinician: Referring Provider: Lamonte Sakai Treating Provider/Extender: Cathie Olden in Treatment: 67 History of Present Illness Location: bilateral feet ulceration on the toes Quality: Patient reports experiencing a dull pain to affected area(s). Severity: Patient states wound are getting better Duration: Patient has had the wound for > 3 months prior to seeking treatment at the wound center Timing: Pain in wound is constant (hurts all the time) Context: The wound would happen gradually Modifying Factors: Other treatment(s) tried include:treatment for lymphedema and is seen by the podiatrist Dr. Caryl Comes Associated Signs and Symptoms: Patient reports having increase swelling. HPI Description: 67 year old patient here to see as for bilateral feet ulceration to on his left first and second toe and 2 on his right first and second toe, which she's had for about 4 months. 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 Donella Stade. The patient is also  undergoing workup by medical oncology for a intra-abdominal lymphadenopathy. 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 He is also status post appendectomy, inguinal lymph node biopsy, rectal biopsy and rectal examination under anesthesia. he currently smokes cigarettes about half packet a day. In March of this year he was seen by Dr. Hortencia Pilar, for  evaluation of bilateral varicose veins and besides wearing compression stockings he had recommended laser ablation of the right and left great saphenous veins to eleviate the symptoms and complications of severe superficial venous reflux disease. He also recommended lymphedema pumps for better control of his lymphedema. The patient recently has had on 08/23/2016, right greater saphenous vein ablation with the laser energy Earlier lower extremity venous reflux examination done on 05/08/2016 showed no DVT or SVT both lower legs but incompetence of bilateral great saphenous veins was present. A lower arterial study was also done and there was no significant right lower and left lower extremity problems based on a normal toe brachial index bilaterally and the ABI was 1.21 the left and 1.23 on the right. His post ablation venous duplex examination showed successful ablation of the right GS vein with thrombus formation 2 below the right saphenofemoral junction. The deep system was patent without evidence of thrombosis and this was done on 08/30/2016. 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 Addendum: regarding his x-rays done today and x-ray of the left foot -- IMPRESSION: No objective evidence of osteomyelitis. There are soft tissue changes which may reflect cellulitis. X-ray of the right foot -- IMPRESSION:Findings  compatible with cellulitis of the toes. No objective evidence of osteomyelitis is observed. 10/01/16 on evaluation today patient's wounds appeared to be doing some better. I did review the x-rays as well which showed no evidence of osteomyelitis although there was evidence on x-ray of cellulitis. He fortunately is not having any discomfort although he continues to have some swelling. He does not remember being on any antibiotics recently. 10/15/16 on evaluation today patient's wounds overall appear to be doing better although he does have a new location noted on the left foot. Fortunately he is not having significant pain. It almost has the appearance that something is rubbing on the end of his toes but he wears the open toe shoes and according to what he is telling me never wears anything that would rub on his foot. There is no evidence of infection and specifically no evidence of a fungal infection 10/22/16 On evaluation today patient's wounds appeared to be doing better compared to last week in regard to his bilateral Weidner, Jaun E. ([id number]) lower extremities. Fortunately I happy with how things are progressing although he still has ulcers I feel like that he is improving and appropriate manner. 11/12/16 on evaluation today patient appears to be doing well in regard to his bilateral feet and the respective wounds. We have been using surrounding her dressings along with an antifungal cream which seems to be doing very well. He has no bilateral dysfunction noticed that the rituals are weight loss at this point. He also has no nausea or vomiting a note purulent discharge. He did see Vein and vascular today and he tells me that they told him he could have surgery for his venous stasis but they did not feel like it was worth it in his words. Fortunately patient's wounds do appear to be getting sneakily better. 11/26/2016 -- he says he is going to have some surgery during this week at [facility] for  possibly a colon resection. 12/31/2016 -- the patient has been noncompliant with his smoking and I'm not sure whether he is also started drinking again. He continues to be very nonchalant about his care 01/14/2017 -- the patient's HandP has been  reviewed well and I understand he is being compliant with trying to give up smoking and his local dressing changes. He does not have any surgical options of 4 to him by his vascular surgeons.he was last seen in early August by Dr. Hortencia Pilar who recommended compression stockings,and possibly lymph pumps in 2-3 months after doing a review ultrasound. 01/28/2017 - the patient did not have any fresh complaints but on examination I noted a large lacerated wound on the plantar aspect of his right fourth toe which had a lot of necrotic debris and it probes down to bone. 02/07/2017 -- x-ray of the right foot -- IMPRESSION: Soft tissue swelling about the first through fourth toes consistent with cellulitis. New destructive change in the tuft of the distal phalanx of the great toe is consistent with osteomyelitis. 02/14/2017 -- the patient's MRI is pending this coming Monday and he still continues to smoke. We have again gone over off loading of his wounds in great detail and he says he's been compliant. 02/21/2017 -- MR of the right foot -- IMPRESSION: 1. Soft tissue ulcer at the tip of the first, second and third toe knows. Cortical irregularity and bone marrow edema in the first distal phalanx most concerning for osteomyelitis. Mild marrow edema in the second and third distal phalanx without definite cortical destruction which may reflect early osteomyelitis versus reactive marrow edema. 2. Soft tissue edema surrounding the first phalanx most consistent with cellulitis. the patient was also recently evaluated by his medical oncologist Dr. 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 She is  treating him with weekly Procrit. She is also keeping intra-abdominal lymphadenopathy and right lower lobe lung nodule under observation. 04/04/2017 -- he was seen by Dr. Adrian Prows on 03/25/2017 -- after review he empirically put him on ciprofloxacin and doxycycline as they have good bone penetration and good bioavailability and it will cover the usual pathogens and diabetic foot osteomyelitis. He will check inflammatory markers and plan a 60-63 week old records. C-reactive protein was 0.3 and the ESR was 72 04/18/17 on evaluation today patient appears to be doing about the same in regard to his lower extremity wounds bilaterally. He has continued to use the antifungal cream which does seem to be beneficial. Nonetheless the ulcers do seem to in some areas be epithelial eyes over and in other areas are still open. He is having no significant discomfort. 04/25/17-he is here in follow-up evaluation for multiple ulcerations to multiple toes bilaterally. He states he did see Dr. Ola Spurr again last week and continues antibiotic therapy. He is voicing no complaints or concerns, will continue with current treatment plan will possibility of adding compression therapy next week after an additional week of treatment/lotions to BLE prescribed by Dr Ola Spurr 05/02/17 he is here in follow up for for multiple ulcers to multiple toes bilaterally. we will stop using antifungal cream and will continue with silvercel and follow up next week 05/09/17-he is here in follow-up for multiple ulcerations to multiple toes bilaterally. There is improvement in appearance. He has not completely stopped using antifungal cream, but admits he has not using it between the toes. He has an appointment with Dr. Ola Spurr on 2/11, continues on doxycycline and Cipro. It has been 5 weeks of antibiotic therapy, we will order plain film xray to evaluate for osteomyelitis next week, prior to follow up with ID. Will continue with  silvercel and follow up next week  05/16/17-he is here in follow-up evaluation for multiple ulcerations to multiple toes bilaterally and new wound to the right posterior heel. There is essentially no change in appearance, deteriorating measurements; he has a history of waxing and waning measurements. He admits that he continues to apply moisturizer/cream/ointment to his toes despite weekly reminders to only apply silvercel to his toes. He states that he thinks the surgical shoe contributed to the superficial ulcer to his posterior Strawder, [name] ([id number]) heel, he is unable to articulate if this was an area of dry cracked skin as he has a similar area to the left heel. He now is wearing open toed slippers. He has an appointment with Dr. Ola Spurr on 2/11. We have ordered x-rays for her bilateral feet; he was advised to obtain the x-rays today or tomorrow. He will follow-up next week 05/23/17-he is here in follow-up evaluation for multiple ulcerations to multiple toes bilaterally and the right posterior heel. There is improvement in maceration. He has been compliant and not applying any moisturizing agent to his toes. He has been using Lac-Hydrin for his lower extremities with improvement. He did not go to his appointment on Monday with Dr. Ola Spurr secondary to financial concerns. X-rays for her bilateral feet showed: LEFT FOOT with slight erosion of the tuft of the distal phalanges of the left first and second toe suspicious for osteomyelitis, RIGHT FOOT with 1.erosion of the tufts of the distal phalanges of the right first second and possibly third toes consistent with osteomyelitis, 2 no definitive abnormality of the calcaneus is seen on the images obtained, 3. Plantar calcaneal degenerative spur. We briefly discussed hyperbaric adjunctive therapy for treatment of chronic refractory osteomyelitis. I do not find an a1c in EMR, will contact PCP for record, or order if needed. He has been  encouraged to contact Dr Ola Spurr office regarding the follow-up appointment, encouraged him to inquire about payment plan. We will continue with same treatment plan and follow-up next week. He states he is still taking antibiotics and has "a lot" left. He states he has been taking them as directed, 2 pills twice daily. According to Dr. Blane Ohara office notes he was originally started on 12/17 for 4 weeks and extended on 1/14 for an additional 4 weeks. He should be done with his antibiotic therapy, he was advised to bring his bottles and to his next appointment, we will contact pharmacy. 05/30/17-he is here in follow-up evaluation for multiple ulcerations to multiple toes bilaterally and the right posterior heel. He is accompanied by his brother-in-law. Wounds are stable. He has yet to make up with Dr. Ola Spurr. We contacted his PCP, with no record of recent A1c we will draw an A1c. His brother-in-law states that he was taken off all of his diabetic medication secondary to kidney function. 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 The antibiotics were counted and have approximately 2 weeks left, although they should be complete. The pill organizer reveals missing days. We discussed the need for consistent medications, to have optimal benefit of medication. He has a cousin that lives with him and he will ask her to check his organizer daily. He has been advised to follow up with Dr Ola Spurr, and will go by the office today. He has been advised to quit smoking. 06/06/17-he is here in follow up evaluation. He has had to make an appointment with Dr. Ola Spurr. He did have blood work obtained, a1c 5.  He continues to take antibiotic therapy. Significant improvement in bilateral lower extremity edema with compression therapy. Essentially no change in ulcerations to toes. He states he is "going to try  something different" and "let me know next week" if it works; he would not provide any additional information and was encouraged to follow our orders. We will follow up next week 06/13/17-he is here in follow-up evaluation. He has an appointment with Dr. Ola Spurr tomorrow morning. He states he purchased an ointment from [facility] and applied to his toes for 3 days, he does not remember the name of the ointment. There is improvement to his wounds, minimal maceration. He continues to take antibiotic therapy, this should have been completed last month. His brother-in-law who regularly accompanies his appointments was asked to take the bottles to the appointment tomorrow with Dr. Ola Spurr so he is aware. We will continue with 3 layer compression, and order OPEN TOE compression 20-30mmHg; we will apply compression stockings next week. He continues to smoke, smoked "2 cigarettes" last week 06/20/17 on evaluation today patient did receive his compression stockings which he has with him today for both lower extremities. With that being said he tells me at this point in time that he is very happy to have these he really is not a big fan of the compression wraps that we have been utilizing although they have been of great benefit for him. Nonetheless at this point he does want to switch to the compression stockings. In my opinion as long as he is continuing with compression I'm okay with the stockings or the wraps. 06/27/17-he is here in follow-up evaluation for multiple ulcerations to his bilateral toes. There is some improvement in appearance. He is compliant in wearing his compression stockings with significant improvement in lower extremity edema. He saw Dr Ola Spurr on 3/8, per his notes they would redraw ESR and CRP; plan to continue antibiotic therapy if these remain elevated. I do not see an ESR or CRP level in Epic. The patient continues to take antibiotics. 07/11/17-He is here in follow-up  evaluation for multiple ulcerations to multiple toes bilaterally. He presents with complete epithelialization to the right third toe; there has been no deterioration. He continues on antibiotic therapy. He will follow-up next week 07/18/17-He is here in follow-up evaluation for multiple ulcerations to multiple toes bilaterally. He continues to make improvement. He continues on antibiotic therapy. He states he has been using something additional to our orders, he does not elaborate but states he will bring it in next week. 07/25/17-He is here in follow up evaluation for multiple ulcerations to bilateral toes. He is stable. He has completed antibiotic therapy. He admits to "filing" his toes after showers each evening, this is what he was referencing last week; he does not filing for the wounds. We will switch to [facility] and monitor for any improvement, he will follow-up next week 08/01/17-He is here in follow-up evaluation. He admits to "picking" at his toes after cleansing yesterday, leading to new areas of tissue loss on the bilateral second toe. There is improvement noted to the bilateral great toe. We will dress toes today and hope that they maintain until Monday where he will come in for a nurse visit. He has been advised, multiple times with expressed verbalization, to change the dressings to silvercel if the dressings get wet prior to [name]. ATANACIO, MELNYK ([id number]) Electronic Signature(s) Signed: 08/01/2017 10:24:40 AM By: Lawanda Cousins Entered By: Lawanda Cousins on 08/01/2017 10:24:39 Sarate, Percell Miller  EMarland Kitchen ([id number]) -------------------------------------------------------------------------------- Physician Orders Details Patient Name: Granberry, Jhoel E. Date of Service: 08/01/2017 9:15 AM Medical Record Number: [id number] Patient Account Number: [id number] Date of Birth/Sex: [birth year] (67 y.o. M) Treating RN: Ahmed Prima Primary Care Provider: Lamonte Sakai Other  Clinician: Referring Provider: Lamonte Sakai Treating Provider/Extender: Cathie Olden in Treatment: 40 Verbal / Phone Orders: Yes Clinician: Carolyne Fiscal, Debi Read Back and Verified: Yes Diagnosis Coding Wound Cleansing Wound #1 Right Toe Great o Clean wound with Normal Saline. o Cleanse wound with mild soap and water o May Shower, gently pat wound dry prior to applying new dressing. Wound #2 Right Toe Second o Clean wound with Normal Saline. o Cleanse wound with mild soap and water o May Shower, gently pat wound dry prior to applying new dressing. Wound #5 Left Toe Great o Clean wound with Normal Saline. o Cleanse wound with mild soap and water o May Shower, gently pat wound dry prior to applying new dressing. Wound #6 Left Toe Second o Clean wound with Normal Saline. o Cleanse wound with mild soap and water o May Shower, gently pat wound dry prior to applying new dressing. Anesthetic (add to Medication List) Wound #1 Right Toe Great o Topical Lidocaine 4% cream applied to wound bed prior to debridement (In Clinic Only). Wound #2 Right Toe Second o Topical Lidocaine 4% cream applied to wound bed prior to debridement (In Clinic Only). Wound #5 Left Toe Great o Topical Lidocaine 4% cream applied to wound bed prior to debridement (In Clinic Only). Wound #6 Left Toe Second o Topical Lidocaine 4% cream applied to wound bed prior to debridement (In Clinic Only). Primary Wound Dressing Wound #1 Right Toe Great o Hydrafera Blue Ready Transfer Wound #2 Right Toe Second o Silver Alginate Wound #5 Left Toe Great o Hydrafera Blue Ready Transfer Wound #6 Left Toe Second GARVIN, ELLENA E. ([id number]) o Silver Alginate Secondary Dressing Wound #1 Right Toe Great o ABD pad o Kerlix and Coban o Other - netting Wound #2 Right Toe Second o ABD pad o Kerlix and Coban o Other - netting Wound #5 Left Toe Great o ABD pad o  Kerlix and Coban o Other - netting Wound #6 Left Toe Second o ABD pad o Kerlix and Coban o Other - netting Dressing Change Frequency Wound #1 Right Toe Great o Dressing is to be changed Monday and Thursday. Wound #2 Right Toe Second o Dressing is to be changed Monday and Thursday. Wound #5 Left Toe Great o Dressing is to be changed Monday and Thursday. Wound #6 Left Toe Second o Dressing is to be changed Monday and Thursday. Follow-up Appointments Wound #1 Right Toe Great o Return Appointment in 1 week. o Nurse Visit as needed Wound #2 Right Toe Second o Return Appointment in 1 week. o Nurse Visit as needed Wound #5 Left Toe Great o Return Appointment in 1 week. o Nurse Visit as needed Wound #6 Left Toe Second o Return Appointment in 1 week. o Nurse Visit as needed Edema Control o Patient to wear own compression stockings [name], [name] ([id number]) o Elevate legs to the level of the heart and pump ankles as often as possible Additional Orders / Instructions Wound #1 Right Toe Great o Stop Smoking o Increase protein intake. o Other: - Please add vitamin A, vitamin C and zinc supplements to your diet Wound #2 Right Toe Second o Stop Smoking o Increase protein intake. o Other: - Please add vitamin A, vitamin C and zinc  supplements to your diet Wound #5 Left Toe Great o Stop Smoking o Increase protein intake. o Other: - Please add vitamin A, vitamin C and zinc supplements to your diet Wound #6 Left Toe Second o Stop Smoking o Increase protein intake. o Other: - Please add vitamin A, vitamin C and zinc supplements to your diet Patient Medications Allergies: No Known Drug Allergies Notifications Medication Indication Start End lidocaine DOSE 1 - topical 4 % cream - 1 cream topical Electronic Signature(s) Signed: 08/01/2017 5:04:33 PM By: Lawanda Cousins Signed: 08/02/2017 4:29:16 PM By: Alric Quan Entered By: Alric Quan on 08/01/2017 10:00:07 Mullen, [name] ([id number]) -------------------------------------------------------------------------------- Prescription 08/01/2017 Patient Name: [name] Provider: Lawanda Cousins NP Date of Birth: 04-20-1950 NPI#: [id number] Sex: Jerilynn Mages DEA#: [id number] Phone #: [phone] License #: Patient Address: [facility] [place], [place] [address] [address], [place], [place] [address] [id number] Allergies No Known Drug Allergies Medication Medication: Route: Strength: Form: lidocaine 4 % topical cream topical 4% cream Class: TOPICAL LOCAL ANESTHETICS Dose: Frequency / Time: Indication: 1 1 cream topical Number of Refills: Number of Units: 0 Generic Substitution: Start Date: End Date: One Time Use: Substitution Permitted No Note to Pharmacy: Signature(s): Date(s): Electronic Signature(s) Signed: 08/01/2017 5:04:33 PM By: Lawanda Cousins Signed: 08/02/2017 4:29:16 PM By: Alric Quan Entered By: Alric Quan on 08/01/2017 10:00:09 Hellstrom, Rylen E. ([id number]) --------------------------------------------------------------------------------  Problem List Details Patient Name: Cocker, Micahel E. Date of Service: 08/01/2017 9:15 AM Medical Record Number: [id number] Patient Account Number: [id number] Date of Birth/Sex: 20-Sep-1950 (67 y.o. M) Treating RN: Ahmed Prima Primary Care Provider: Lamonte Sakai Other Clinician: Referring Provider: Lamonte Sakai Treating Provider/Extender: Cathie Olden in Treatment: 54 Active Problems ICD-10 Impacting Encounter Code Description Active Date Wound Healing Diagnosis E11.621 Type 2 diabetes mellitus with foot ulcer 09/21/2016 Yes I87.313 Chronic venous hypertension (idiopathic) with ulcer of 09/21/2016 Yes bilateral lower extremity I89.0 Lymphedema, not elsewhere classified  09/21/2016 Yes L97.522 Non-pressure chronic ulcer of other part of left foot with fat 09/21/2016 Yes layer exposed L97.512 Non-pressure chronic ulcer of other part of right foot with fat 09/21/2016 Yes layer exposed F17.218 Nicotine dependence, cigarettes, with other nicotine-induced 09/21/2016 Yes disorders F10.19 Alcohol abuse with unspecified alcohol-induced disorder 09/21/2016 Yes M86.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, right ankle and foot 02/21/2017 Yes Inactive Problems Resolved Problems Electronic Signature(s) DAXX, TIGGS ([id number]) Signed: 08/01/2017 10:20:32 AM By: Lawanda Cousins Entered By: Lawanda Cousins on 08/01/2017 10:20:31 Rosen, [name] ([id number]) -------------------------------------------------------------------------------- Progress Note Details Patient Name: [name], [name] Date of Service: 08/01/2017 9:15 AM Medical Record Number: [id number] Patient Account Number: [id number] Date of Birth/Sex: [birth year] (67 y.o. M) Treating RN: Ahmed Prima Primary Care Provider: Lamonte Sakai Other Clinician: Referring Provider: Lamonte Sakai Treating Provider/Extender: Cathie Olden in Treatment: 51 Subjective Chief Complaint Information obtained from Patient Patient presents for treatment of an open diabetic ulcer to both feet History of Present Illness (HPI) The following HPI elements were documented for the patient's wound: Location: bilateral feet ulceration on the toes Quality: Patient reports experiencing a dull pain to affected area(s). Severity: Patient states wound are getting better Duration: Patient has had the wound for > 3 months prior to seeking treatment at the wound center Timing: Pain in wound is constant (hurts all the time) Context: The wound would happen gradually Modifying Factors: Other treatment(s) tried include:treatment for lymphedema and is seen by the podiatrist Dr. Caryl Comes Associated Signs and Symptoms: Patient reports  having increase  swelling. 67 year old patient here to see as for bilateral feet ulceration to on his left first and second toe and 2 on his right first and second toe, which she's had for about 4 months. 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 Donella Stade. The patient is also undergoing workup by medical oncology for a intra-abdominal lymphadenopathy. 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 He is also status post appendectomy, inguinal lymph node biopsy, rectal biopsy and rectal examination under anesthesia. he currently smokes cigarettes about half packet a day. In March of this year he was seen by Dr. Hortencia Pilar, for evaluation of bilateral varicose veins and besides wearing compression stockings he had recommended laser ablation of the right and left great saphenous veins to eleviate the symptoms and complications of severe superficial venous reflux disease. He also recommended lymphedema pumps for better control of his lymphedema. The patient recently has had on 08/23/2016, right greater saphenous vein ablation with the laser energy Earlier lower extremity venous reflux examination done on 05/08/2016 showed no DVT or SVT both lower legs but incompetence of bilateral great saphenous veins was present. A lower arterial study was also done and there was no significant right lower and left lower extremity problems based on a normal toe brachial index bilaterally and the ABI was 1.21 the left and 1.23 on the right. His post ablation venous duplex examination showed successful ablation of the right GS vein with thrombus formation 2 below the right saphenofemoral junction. The deep system was patent without evidence of thrombosis and this was done  on 08/30/2016. 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 Addendum: regarding his x-rays done today and x-ray of the left foot -- IMPRESSION: No objective evidence of osteomyelitis. There are soft tissue changes which may reflect cellulitis. X-ray of the right foot -- IMPRESSION:Findings compatible with cellulitis of the toes. No objective evidence of osteomyelitis is observed. 10/01/16 on evaluation today patient's wounds appeared to be doing some better. I did review the x-rays as well which showed no evidence of osteomyelitis although there was evidence on x-ray of cellulitis. He fortunately is not having any discomfort [name], [name] ([id number]) although he continues to have some swelling. He does not remember being on any antibiotics recently. 10/15/16 on evaluation today patient's wounds overall appear to be doing better although he does have a new location noted on the left foot. Fortunately he is not having significant pain. It almost has the appearance that something is rubbing on the end of his toes but he wears the open toe shoes and according to what he is telling me never wears anything that would rub on his foot. There is no evidence of infection and specifically no evidence of a fungal infection 10/22/16 On evaluation today patient's wounds appeared to be doing better compared to last week in regard to his bilateral lower extremities. Fortunately I happy with how things are progressing although he still has ulcers I feel like that he is improving and appropriate manner. 11/12/16 on evaluation today patient appears to be doing well in regard to his bilateral feet and the respective wounds. We have been using surrounding  her dressings along with an antifungal cream which seems to be doing very well. He has no bilateral dysfunction noticed that the rituals are weight loss at this  point. He also has no nausea or vomiting a note purulent discharge. He did see Vein and vascular today and he tells me that they told him he could have surgery for his venous stasis but they did not feel like it was worth it in his words. Fortunately patient's wounds do appear to be getting sneakily better. 11/26/2016 -- he says he is going to have some surgery during this week at [facility] for possibly a colon resection. 12/31/2016 -- the patient has been noncompliant with his smoking and I'm not sure whether he is also started drinking again. He continues to be very nonchalant about his care 01/14/2017 -- the patient's HandP has been reviewed well and I understand he is being compliant with trying to give up smoking and his local dressing changes. He does not have any surgical options of 4 to him by his vascular surgeons.he was last seen in early August by Dr. Hortencia Pilar who recommended compression stockings,and possibly lymph pumps in 2-3 months after doing a review ultrasound. 01/28/2017 - the patient did not have any fresh complaints but on examination I noted a large lacerated wound on the plantar aspect of his right fourth toe which had a lot of necrotic debris and it probes down to bone. 02/07/2017 -- x-ray of the right foot -- IMPRESSION: Soft tissue swelling about the first through fourth toes consistent with cellulitis. New destructive change in the tuft of the distal phalanx of the great toe is consistent with osteomyelitis. 02/14/2017 -- the patient's MRI is pending this coming Monday and he still continues to smoke. We have again gone over off loading of his wounds in great detail and he says he's been compliant. 02/21/2017 -- MR of the right foot -- IMPRESSION: 1. Soft tissue ulcer at the tip of the first, second and third toe knows. Cortical irregularity and bone marrow edema in the first distal phalanx most concerning for osteomyelitis. Mild marrow edema in the second and  third distal phalanx without definite cortical destruction which may reflect early osteomyelitis versus reactive marrow edema. 2. Soft tissue edema surrounding the first phalanx most consistent with cellulitis. the patient was also recently evaluated by his medical oncologist Dr. 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 She is treating him with weekly Procrit. She is also keeping intra-abdominal lymphadenopathy and right lower lobe lung nodule under observation. 04/04/2017 -- he was seen by Dr. Adrian Prows on 03/25/2017 -- after review he empirically put him on ciprofloxacin and doxycycline as they have good bone penetration and good bioavailability and it will cover the usual pathogens and diabetic foot osteomyelitis. He will check inflammatory markers and plan a 52-35 week old records. C-reactive protein was 0.3 and the ESR was 72 04/18/17 on evaluation today patient appears to be doing about the same in regard to his lower extremity wounds bilaterally. He has continued to use the antifungal cream which does seem to be beneficial. Nonetheless the ulcers do seem to in some areas be epithelial eyes over and in other areas are still open. He is having no significant discomfort. 04/25/17-he is here in follow-up evaluation for multiple ulcerations to multiple toes bilaterally. He states he did see Dr. Ola Spurr again last week and continues antibiotic therapy. He is voicing  no complaints or concerns, will continue with current treatment plan will possibility of adding compression therapy next week after an additional week of treatment/lotions to BLE prescribed by Dr Ola Spurr 05/02/17 he is here in follow up for for multiple ulcers to multiple toes bilaterally. we will stop using antifungal cream and will continue with silvercel and follow up next week [name], [name] ([id number]) 05/09/17-he is here in follow-up for multiple  ulcerations to multiple toes bilaterally. There is improvement in appearance. He has not completely stopped using antifungal cream, but admits he has not using it between the toes. He has an appointment with Dr. Ola Spurr on 2/11, continues on doxycycline and Cipro. It has been 5 weeks of antibiotic therapy, we will order plain film xray to evaluate for osteomyelitis next week, prior to follow up with ID. Will continue with silvercel and follow up next week 05/16/17-he is here in follow-up evaluation for multiple ulcerations to multiple toes bilaterally and new wound to the right posterior heel. There is essentially no change in appearance, deteriorating measurements; he has a history of waxing and waning measurements. He admits that he continues to apply moisturizer/cream/ointment to his toes despite weekly reminders to only apply silvercel to his toes. He states that he thinks the surgical shoe contributed to the superficial ulcer to his posterior heel, he is unable to articulate if this was an area of dry cracked skin as he has a similar area to the left heel. He now is wearing open toed slippers. He has an appointment with Dr. Ola Spurr on 2/11. We have ordered x-rays for her bilateral feet; he was advised to obtain the x-rays today or tomorrow. He will follow-up next week 05/23/17-he is here in follow-up evaluation for multiple ulcerations to multiple toes bilaterally and the right posterior heel. There is improvement in maceration. He has been compliant and not applying any moisturizing agent to his toes. He has been using Lac-Hydrin for his lower extremities with improvement. He did not go to his appointment on Monday with Dr. Ola Spurr secondary to financial concerns. X-rays for her bilateral feet showed: LEFT FOOT with slight erosion of the tuft of the distal phalanges of the left first and second toe suspicious for osteomyelitis, RIGHT FOOT with 1.erosion of the tufts of the  distal phalanges of the right first second and possibly third toes consistent with osteomyelitis, 2 no definitive abnormality of the calcaneus is seen on the images obtained, 3. Plantar calcaneal degenerative spur. We briefly discussed hyperbaric adjunctive therapy for treatment of chronic refractory osteomyelitis. I do not find an a1c in EMR, will contact PCP for record, or order if needed. He has been encouraged to contact Dr Ola Spurr office regarding the follow-up appointment, encouraged him to inquire about payment plan. We will continue with same treatment plan and follow-up next week. He states he is still taking antibiotics and has "a lot" left. He states he has been taking them as directed, 2 pills twice daily. According to Dr. Blane Ohara office notes he was originally started on 12/17 for 4 weeks and extended on 1/14 for an additional 4 weeks. He should be done with his antibiotic therapy, he was advised to bring his bottles and to his next appointment, we will contact pharmacy. 05/30/17-he is here in follow-up evaluation for multiple ulcerations to multiple toes bilaterally and the right posterior heel. He is accompanied by his brother-in-law. Wounds are stable. He has yet to make up with Dr. Ola Spurr. We contacted his PCP, with no record of  recent A1c we will draw an A1c. His brother-in-law states that he was taken off all of his diabetic medication secondary to kidney function. 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 The antibiotics were counted and have approximately 2 weeks left, although they should be complete. The pill organizer reveals missing days. We discussed the need for consistent medications, to have optimal benefit of medication. He has a cousin that lives with him and he will ask her to check his organizer daily. He has been advised to follow up with Dr Ola Spurr, and  will go by the office today. He has been advised to quit smoking. 06/06/17-he is here in follow up evaluation. He has had to make an appointment with Dr. Ola Spurr. He did have blood work obtained, a1c 5. He continues to take antibiotic therapy. Significant improvement in bilateral lower extremity edema with compression therapy. Essentially no change in ulcerations to toes. He states he is "going to try something different" and "let me know next week" if it works; he would not provide any additional information and was encouraged to follow our orders. We will follow up next week 06/13/17-he is here in follow-up evaluation. He has an appointment with Dr. Ola Spurr tomorrow morning. He states he purchased an ointment from [facility] and applied to his toes for 3 days, he does not remember the name of the ointment. There is improvement to his wounds, minimal maceration. He continues to take antibiotic therapy, this should have been completed last month. His brother-in-law who regularly accompanies his appointments was asked to take the bottles to the appointment tomorrow with Dr. Ola Spurr so he is aware. We will continue with 3 layer compression, and order OPEN TOE compression 20-30mmHg; we will apply compression stockings next week. He continues to smoke, smoked "2 cigarettes" last week 06/20/17 on evaluation today patient did receive his compression stockings which he has with him today for both lower extremities. With that being said he tells me at this point in time that he is very happy to have these he really is not a big fan of the compression wraps that we have been utilizing although they have been of great benefit for him. Nonetheless at this point he does want to switch to the compression stockings. In my opinion as long as he is continuing with compression I'm okay with the stockings or the wraps. 06/27/17-he is here in follow-up evaluation for multiple ulcerations to his bilateral toes.  There is some improvement in appearance. He is compliant in wearing his compression stockings with significant improvement in lower extremity edema. He saw Dr Ola Spurr on 3/8, per his notes they would redraw ESR and CRP; plan to continue antibiotic therapy if these remain elevated. I do not see an ESR or CRP level in Epic. The patient continues to take antibiotics. 07/11/17-He is here in follow-up evaluation for multiple ulcerations to multiple toes bilaterally. He presents with complete epithelialization to the right third toe; there has been no deterioration. He continues on antibiotic therapy. He will follow-up next week 07/18/17-He is here in follow-up evaluation for multiple ulcerations to multiple toes bilaterally. He continues to make improvement. He continues on antibiotic therapy. He states he has been using something additional to our orders, he does Egelhoff, Garen E. ([id number]) not elaborate but states he will bring it in next week. 07/25/17-He is here in follow up evaluation for multiple ulcerations to bilateral toes. He is stable.  He has completed antibiotic therapy. He admits to "filing" his toes after showers each evening, this is what he was referencing last week; he does not filing for the wounds. We will switch to [facility] and monitor for any improvement, he will follow-up next week 08/01/17-He is here in follow-up evaluation. He admits to "picking" at his toes after cleansing yesterday, leading to new areas of tissue loss on the bilateral second toe. There is improvement noted to the bilateral great toe. We will dress toes today and hope that they maintain until Monday where he will come in for a nurse visit. He has been advised, multiple times with expressed verbalization, to change the dressings to silvercel if the dressings get wet prior to [facility] appointment. Patient History Information obtained from Patient. Social History Current every day smoker, Marital Status -  Widowed, Alcohol Use - Daily - quit drinking about a week ago, Drug Use - No History, Caffeine Use - Moderate. Medical And Surgical History Notes Oncologic 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 Objective Constitutional Vitals Time Taken: 9:32 AM, Height: 69 in, Weight: 168 lbs, BMI: 24.8, Temperature: 98.2 F, Pulse: 59 bpm, Respiratory Rate: 18 breaths/min, Blood Pressure: 158/78 mmHg. Integumentary (Hair, Skin) Wound #1 status is Open. Original cause of wound was Gradually Appeared. The wound is located on the Right Toe Great. The wound measures 0.4cm length x 0.5cm width x 0.1cm depth; 0.157cm^2 area and 0.016cm^3 volume. There is Fat Layer (Subcutaneous Tissue) Exposed exposed. There is no tunneling or undermining noted. There is a large amount of serosanguineous drainage noted. The wound margin is flat and intact. There is large (67-100%) red granulation within the wound bed. There is a small (1-33%) amount of necrotic tissue within the wound bed including Eschar and Adherent Slough. The periwound skin appearance exhibited: Callus, Excoriation, Maceration. The periwound skin appearance did not exhibit: Crepitus, Induration, Rash, Scarring, Dry/Scaly, Atrophie Blanche, Cyanosis, Ecchymosis, Hemosiderin Staining, Mottled, Pallor, Rubor, Erythema. Periwound temperature was noted as No Abnormality. Wound #12 status is Healed - Epithelialized. Original cause of wound was Gradually Appeared. The wound is located on the Right,Dorsal Foot. The wound measures 0cm length x 0cm width x 0cm depth; 0cm^2 area and 0cm^3 volume. Wound #2 status is Open. Original cause of wound was Gradually Appeared. The wound is located on the Right Toe Second. The wound measures 0.5cm length x 1.6cm width x 0.1cm depth; 0.628cm^2 area and 0.063cm^3 volume. There is Fat Layer (Subcutaneous Tissue) Exposed exposed. There is no tunneling or undermining noted. There is a large amount  of serosanguineous drainage noted. The wound margin is flat and intact. There is large (67-100%) red granulation within the wound bed. There is a small (1-33%) amount of necrotic tissue within the wound bed including Adherent Slough. The periwound skin appearance exhibited: Scarring, Maceration. The periwound skin appearance did not exhibit: Callus, Crepitus, Excoriation, Induration, Rash, Dry/Scaly, Atrophie Blanche, Cyanosis, Ecchymosis, Hemosiderin Staining, Mottled, Pallor, Rubor, Erythema. Periwound temperature was noted as No Abnormality. [name], [name] ([id number]) Wound #5 status is Open. Original cause of wound was Gradually Appeared. The wound is located on the Left Toe Great. The wound measures 1.2cm length x 1.5cm width x 0.1cm depth; 1.414cm^2 area and 0.141cm^3 volume. There is Fat Layer (Subcutaneous Tissue) Exposed exposed. There is no tunneling or undermining noted. There is a large amount of serosanguineous drainage noted. Foul odor after cleansing was noted. The wound margin is flat and intact. There is large (67-100%) red granulation  within the wound bed. There is a small (1-33%) amount of necrotic tissue within the wound bed including Adherent Slough. The periwound skin appearance exhibited: Maceration. The periwound skin appearance did not exhibit: Callus, Crepitus, Excoriation, Induration, Rash, Scarring, Dry/Scaly, Atrophie Blanche, Cyanosis, Ecchymosis, Hemosiderin Staining, Mottled, Pallor, Rubor, Erythema. Periwound temperature was noted as No Abnormality. Wound #6 status is Open. Original cause of wound was Gradually Appeared. The wound is located on the Left Toe Second. The wound measures 1.6cm length x 2.3cm width x 0.1cm depth; 2.89cm^2 area and 0.289cm^3 volume. There is Fat Layer (Subcutaneous Tissue) Exposed exposed. There is no tunneling or undermining noted. There is a large amount of serosanguineous drainage noted. The wound margin is flat and intact. There is  large (67-100%) red, hyper - granulation within the wound bed. There is a small (1-33%) amount of necrotic tissue within the wound bed including Adherent Slough. The periwound skin appearance exhibited: Maceration. The periwound skin appearance did not exhibit: Callus, Crepitus, Excoriation, Induration, Rash, Scarring, Dry/Scaly, Atrophie Blanche, Cyanosis, Ecchymosis, Hemosiderin Staining, Mottled, Pallor, Rubor, Erythema. Periwound temperature was noted as No Abnormality. Assessment Active Problems ICD-10 E11.621 - Type 2 diabetes mellitus with foot ulcer I87.313 - Chronic venous hypertension (idiopathic) with ulcer of bilateral lower extremity I89.0 - Lymphedema, not elsewhere classified L97.522 - Non-pressure chronic ulcer of other part of left foot with fat layer exposed L97.512 - Non-pressure chronic ulcer of other part of right foot with fat layer exposed F17.218 - Nicotine dependence, cigarettes, with other nicotine-induced disorders F10.19 - Alcohol abuse 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 - Chronic multifocal osteomyelitis, right ankle and foot Procedures Wound #1 Pre-procedure diagnosis of Wound #1 is a Diabetic Wound/Ulcer of the Lower Extremity located on the Right Toe Great .Severity of Tissue Pre Debridement is: Fat layer exposed. There was a Excisional Skin/Subcutaneous Tissue Debridement with a total area of 0.2 sq cm performed by Lawanda Cousins, NP. With the following instrument(s): Curette. to remove Viable and Non-Viable tissue/material Material removed includes Subcutaneous Tissue, and Slough, Fibrin/Exudate, and [place] after achieving pain control using Lidocaine 4% Topical Solution. No specimens were taken. A time out was conducted at 09:50, prior to the start of the procedure. A Minimum amount of bleeding was controlled with Pressure. The procedure was tolerated well with a pain level of 0 throughout and a pain level of 0 following the procedure. Post  Debridement Measurements: 0.4cm length x 0.5cm width x 0.2cm depth; 0.031cm^3 volume. Character of Wound/Ulcer Post Debridement requires further debridement. Severity of Tissue Post Debridement is: Fat layer exposed. Post procedure Diagnosis Wound #1: Same as Pre-Procedure [name], [name] ([id number]) Plan Wound Cleansing: Wound #1 Right Toe Great: Clean wound with Normal Saline. Cleanse wound with mild soap and water May Shower, gently pat wound dry prior to applying new dressing. Wound #2 Right Toe Second: Clean wound with Normal Saline. Cleanse wound with mild soap and water May Shower, gently pat wound dry prior to applying new dressing. Wound #5 Left Toe Great: Clean wound with Normal Saline. Cleanse wound with mild soap and water May Shower, gently pat wound dry prior to applying new dressing. Wound #6 Left Toe Second: Clean wound with Normal Saline. Cleanse wound with mild soap and water May Shower, gently pat wound dry prior to applying new dressing. Anesthetic (add to Medication List): Wound #1 Right Toe Great: Topical Lidocaine 4% cream applied to wound bed prior to debridement (In Clinic Only). Wound #2 Right Toe Second: Topical Lidocaine 4% cream applied to  wound bed prior to debridement (In Clinic Only). Wound #5 Left Toe Great: Topical Lidocaine 4% cream applied to wound bed prior to debridement (In Clinic Only). Wound #6 Left Toe Second: Topical Lidocaine 4% cream applied to wound bed prior to debridement (In Clinic Only). Primary Wound Dressing: Wound #1 Right Toe Great: Hydrafera Blue Ready Transfer Wound #2 Right Toe Second: Silver Alginate Wound #5 Left Toe Great: Hydrafera Blue Ready Transfer Wound #6 Left Toe Second: Silver Alginate Secondary Dressing: Wound #1 Right Toe Great: ABD pad Kerlix and Coban Other - netting Wound #2 Right Toe Second: ABD pad Kerlix and Coban Other - netting Wound #5 Left Toe Great: ABD pad Kerlix and Coban Other -  netting Wound #6 Left Toe Second: ABD pad Kerlix and Coban Other - netting Dressing Change Frequency: Wound #1 Right Toe GreatMICHAEL, WALRATH ([id number]) Dressing is to be changed Monday and Thursday. Wound #2 Right Toe Second: Dressing is to be changed Monday and Thursday. Wound #5 Left Toe Great: Dressing is to be changed Monday and Thursday. Wound #6 Left Toe Second: Dressing is to be changed Monday and Thursday. Follow-up Appointments: Wound #1 Right Toe Great: Return Appointment in 1 week. Nurse Visit as needed Wound #2 Right Toe Second: Return Appointment in 1 week. Nurse Visit as needed Wound #5 Left Toe Great: Return Appointment in 1 week. Nurse Visit as needed Wound #6 Left Toe Second: Return Appointment in 1 week. Nurse Visit as needed Edema Control: Patient to wear own compression stockings Elevate legs to the level of the heart and pump ankles as often as possible Additional Orders / Instructions: Wound #1 Right Toe Great: Stop Smoking Increase protein intake. Other: - Please add vitamin A, vitamin C and zinc supplements to your diet Wound #2 Right Toe Second: Stop Smoking Increase protein intake. Other: - Please add vitamin A, vitamin C and zinc supplements to your diet Wound #5 Left Toe Great: Stop Smoking Increase protein intake. Other: - Please add vitamin A, vitamin C and zinc supplements to your diet Wound #6 Left Toe Second: Stop Smoking Increase protein intake. Other: - Please add vitamin A, vitamin C and zinc supplements to your diet The following medication(s) was prescribed: lidocaine topical 4 % cream 1 1 cream topical was prescribed at facility Electronic Signature(s) Signed: 08/01/2017 10:25:03 AM By: Lawanda Cousins Entered By: Lawanda Cousins on 08/01/2017 10:25:02 Mcmichael, [name] ([id number]) -------------------------------------------------------------------------------- ROS/PFSH Details Patient Name: Morgan, Latron E. Date of Service:  08/01/2017 9:15 AM Medical Record Number: [id number] Patient Account Number: [id number] Date of Birth/Sex: October 30, 1950 (67 y.o. M) Treating RN: Ahmed Prima Primary Care Provider: Lamonte Sakai Other Clinician: Referring Provider: Lamonte Sakai Treating Provider/Extender: Cathie Olden in Treatment: 38 Information Obtained From Patient Wound History Do you currently have one or more open woundso Yes How many open wounds do you currently haveo 6 Approximately how long have you had your woundso 3 months How have you been treating your wound(s) until nowo ointment and bandage Has your wound(s) ever healed and then re-openedo No Have you had any lab work done in the past montho No Have you tested positive for an antibiotic resistant organism (MRSA, VRE)o No Have you tested positive for osteomyelitis (bone infection)o No Have you had any tests for circulation on your legso Yes Who ordered the testo PCP Where was the test doneo [facility] Medical History: Negative for: Cataracts; Glaucoma; Optic Neuritis Ear/Nose/Mouth/Throat Medical History: Negative for: Chronic sinus problems/congestion; Middle ear problems Hematologic/Lymphatic Medical  History: Positive for: Anemia; Lymphedema Negative for: Hemophilia; Human Immunodeficiency Virus; Sickle Cell Disease Respiratory Medical History: Negative for: Aspiration; Asthma; Chronic Obstructive Pulmonary Disease (COPD); Pneumothorax; Sleep Apnea; Tuberculosis Cardiovascular Medical History: Positive for: Congestive Heart Failure; Hypertension; Peripheral Venous Disease Negative for: Angina; Arrhythmia; Coronary Artery Disease; Deep Vein Thrombosis; Hypotension; Myocardial Infarction; Peripheral Arterial Disease; Phlebitis; Vasculitis Gastrointestinal Medical History: Negative for: Cirrhosis ; Colitis; Crohnos; Hepatitis A; Hepatitis B; Hepatitis C [name], [name] ([id number]) Endocrine Medical History: Positive for: Type II  Diabetes Treated with: Oral agents Blood sugar tested every day: Yes Tested : QD Genitourinary Medical History: Negative for: End Stage Renal Disease Immunological Medical History: Negative for: Lupus Erythematosus; Raynaudos; Scleroderma Integumentary (Skin) Medical History: Negative for: History of Burn; History of pressure wounds Musculoskeletal Medical History: Negative for: Gout; Rheumatoid Arthritis; Osteoarthritis; Osteomyelitis Neurologic Medical History: Positive for: Neuropathy Negative for: Dementia; Quadriplegia; Paraplegia; Seizure Disorder Oncologic Medical History: Past Medical History Notes: 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 Electronic Signature(s) Signed: 08/01/2017 5:04:33 PM By: Lawanda Cousins Signed: 08/02/2017 4:29:16 PM By: Warnell Bureau ([id number]) Entered By: Lawanda Cousins on 08/01/2017 10:24:48 Farkas, [name] ([id number]) -------------------------------------------------------------------------------- SuperBill Details Patient Name: [name], Yoav E. Date of Service: 08/01/2017 Medical Record Number: [id number] Patient Account Number: [id number] Date of Birth/Sex: Oct 02, 1950 (67 y.o. 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.512 Non-pressure chronic ulcer of other part of right foot with fat layer exposed F17.218 Nicotine dependence, cigarettes, 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 abuse 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, right ankle and foot Facility Procedures CPT4 Code: 71292909 Description: 03014 - DEB SUBQ TISSUE 20 SQ CM/< ICD-10 Diagnosis Description L97.522 Non-pressure chronic ulcer of other part of left foot with fat L97.512 Non-pressure chronic ulcer of other part of right foot with fat Modifier: layer exposed layer exposed Quantity: 1 Physician Procedures CPT4 Code: [id number] Description: 11042 - WC PHYS SUBQ TISS 20 SQ CM ICD-10 Diagnosis Description L97.522 Non-pressure chronic ulcer of other part of left foot with fat L97.512 Non-pressure chronic ulcer of other part of right foot with fat Modifier: layer exposed layer exposed Quantity: 1 Electronic Signature(s) Signed: 08/01/2017 10:25:17 AM By: Lawanda Cousins Entered By: Lawanda Cousins on 08/01/2017 10:25:16

## 2017-08-06 NOTE — Progress Notes (Signed)
[name], [name] ([id number]) Visit Report for 08/01/2017 Arrival Information Details Patient Name: [name], [name] Date of Service: 08/01/2017 9:15 AM Medical Record Number: [id number] Patient Account Number: [id number] Date of Birth/Sex: 08/05/1950 (67 y.o. M) Treating RN: Montey Hora Primary Care Lecia Esperanza: Lamonte Sakai Other Clinician: Referring Harlan Vinal: Lamonte Sakai Treating Ilhan Madan/Extender: Cathie Olden in Treatment: 47 Visit Information History Since Last Visit Added or deleted any medications: No Patient Arrived: Ambulatory Any new allergies or adverse reactions: No Arrival Time: 09:27 Had a fall or experienced change in No Accompanied By: brother activities of daily living that may affect Transfer Assistance: None risk of falls: Patient Identification Verified: Yes Signs or symptoms of abuse/neglect since last visito No Secondary Verification Process Completed: Yes Hospitalized since last visit: No Patient Requires Transmission-Based No Implantable device outside of the clinic excluding No Precautions: cellular tissue based products placed in the center Patient Has Alerts: Yes since last visit: Patient Alerts: DMII Has Dressing in Place as Prescribed: Yes Has Compression in Place as Prescribed: Yes Pain Present Now: No Electronic Signature(s) Signed: 08/01/2017 3:02:22 PM By: Montey Hora Entered By: Montey Hora on 08/01/2017 09:32:42 [name], [name] ([id number]) -------------------------------------------------------------------------------- Encounter Discharge Information Details Patient Name: [name], [name] Date of Service: 08/01/2017 9:15 AM Medical Record Number: [id number] Patient Account Number: [id number] Date of Birth/Sex: 10/02/1950 (67 y.o. M) Treating RN: Roger Shelter Primary Care Latoshia Monrroy: Lamonte Sakai Other Clinician: Referring Veera Stapleton: Lamonte Sakai Treating Lori Popowski/Extender: Cathie Olden in Treatment: 44 Encounter  Discharge Information Items Discharge Pain Level: 0 Discharge Condition: Stable Ambulatory Status: Ambulatory Discharge Destination: Home Private Transportation: Auto Schedule Follow-up Appointment: Yes Medication Reconciliation completed and provided No to Patient/Care Siler Mavis: Clinical Summary of Care: Electronic Signature(s) Signed: 08/01/2017 3:28:21 PM By: Roger Shelter Entered By: Roger Shelter on 08/01/2017 10:24:54 [name], [name] ([id number]) -------------------------------------------------------------------------------- Lower Extremity Assessment Details Patient Name: [name], [name] Date of Service: 08/01/2017 9:15 AM Medical Record Number: [id number] Patient Account Number: [id number] Date of Birth/Sex: 1950-08-03 (67 y.o. M) Treating RN: Montey Hora Primary Care Yacine Droz: Lamonte Sakai Other Clinician: Referring Philip Kotlyar: Lamonte Sakai Treating Cordelro Gautreau/Extender: Cathie Olden in Treatment: 44 Edema Assessment Assessed: [Left: No] [Right: No] [Left: Edema] [Right: :] Calf Left: Right: Point of Measurement: 34 cm From Medial Instep 34.4 cm 34.7 cm Ankle Left: Right: Point of Measurement: 12 cm From Medial Instep 24.5 cm 24.2 cm Vascular Assessment Pulses: Dorsalis Pedis Palpable: [Left:Yes] [Right:Yes] Posterior Tibial Extremity colors, hair growth, and conditions: Extremity Color: [Left:Hyperpigmented] [Right:Hyperpigmented] Hair Growth on Extremity: [Left:No] [Right:No] Temperature of Extremity: [Left:Warm] [Right:Warm] Capillary Refill: [Left:< 3 seconds] [Right:< 3 seconds] Toe Nail Assessment Left: Right: Thick: Yes Yes Discolored: Yes Yes Deformed: Yes Yes Improper Length and Hygiene: No No Electronic Signature(s) Signed: 08/01/2017 3:02:22 PM By: Montey Hora Entered By: Montey Hora on 08/01/2017 09:40:48 [name], [name] ([id number]) -------------------------------------------------------------------------------- Multi Wound  Chart Details Patient Name: [name], [name] Date of Service: 08/01/2017 9:15 AM Medical Record Number: [id number] Patient Account Number: [id number] Date of Birth/Sex: 08/12/1950 (67 y.o. M) Treating RN: Ahmed Prima Primary Care Makayela Secrest: Lamonte Sakai Other Clinician: Referring Kadeshia Kasparian: Lamonte Sakai Treating Serenah Mill/Extender: Cathie Olden in Treatment: 47 Vital Signs Height(in): 32 Pulse(bpm): 30 Weight(lbs): 168 Blood Pressure(mmHg): 158/78 Body Mass Index(BMI): 25 Temperature(F): 98.2 Respiratory Rate 18 (breaths/min): Photos: [1:No Photos] [12:No Photos] Wound Location: [1:Right Toe Great] [12:Right, Dorsal Foot] Wounding Event: [1:Gradually Appeared] [12:Gradually Appeared] Primary Etiology: [1:Diabetic Wound/Ulcer of the Lower Extremity] [12:Diabetic Wound/Ulcer of the Lower Extremity] Comorbid History: [1:Anemia, Lymphedema,  Congestive Heart Failure, Hypertension, Peripheral Venous Disease, Type II Diabetes, Neuropathy] [12:N/A] Date Acquired: [1:06/11/2016] [12:07/25/2017] Weeks of Treatment: [1:44] [12:1] Wound Status: [1:Open] [12:Healed - Epithelialized] Pending Amputation on [1:Yes] [12:No] Presentation: Measurements L x W x D [1:0.4x0.5x0.1] [12:0x0x0] (cm) Area (cm) : [1:0.157] [12:0] Volume (cm) : [1:0.016] [12:0] % Reduction in Area: [1:99.70%] [12:100.00%] % Reduction in Volume: [1:99.70%] [12:100.00%] Classification: [1:Grade 2] [12:Grade 1] Exudate Amount: [1:Large] [12:N/A] Exudate Type: [1:Serosanguineous] [12:N/A] Exudate Color: [1:red, brown] [12:N/A] Foul Odor After Cleansing: [1:No] [12:N/A] Odor Anticipated Due to [1:N/A] [12:N/A] Product Use: Wound Margin: [1:Flat and Intact] [12:N/A] Granulation Amount: [1:Large (67-100%)] [12:N/A] Granulation Quality: [1:Red] [12:N/A] Necrotic Amount: [1:Small (1-33%)] [12:N/A] Necrotic Tissue: [1:Eschar, Adherent Slough] [12:N/A] Exposed Structures: [1:Fat Layer (Subcutaneous Tissue) Exposed:  Yes Fascia: No Tendon: No] [12:N/A] Muscle: No Muscle: No Joint: No Joint: No Bone: No Bone: No Epithelialization: Small (1-33%) N/A Medium (34-66%) Debridement: Debridement - Excisional N/A N/A Pre-procedure 09:50 N/A N/A Verification/Time Out Taken: Pain Control: Lidocaine 4% Topical Solution N/A N/A Tissue Debrided: Subcutaneous, Slough N/A N/A Level: Skin/Subcutaneous Tissue N/A N/A Debridement Area (sq cm): 0.2 N/A N/A Instrument: Curette N/A N/A Bleeding: Minimum N/A N/A Hemostasis Achieved: Pressure N/A N/A Procedural Pain: 0 N/A N/A Post Procedural Pain: 0 N/A N/A Debridement Treatment Procedure was tolerated well N/A N/A Response: Post Debridement 0.4x0.5x0.2 N/A N/A Measurements L x W x D (cm) Post Debridement Volume: 0.031 N/A N/A (cm) Periwound Skin Texture: Excoriation: Yes No Abnormalities Noted Scarring: Yes Callus: Yes Excoriation: No Induration: No Induration: No Crepitus: No Callus: No Rash: No Crepitus: No Scarring: No Rash: No Periwound Skin Moisture: Maceration: Yes No Abnormalities Noted Maceration: Yes Dry/Scaly: No Dry/Scaly: No Periwound Skin Color: Atrophie Blanche: No No Abnormalities Noted Atrophie Blanche: No Cyanosis: No Cyanosis: No Ecchymosis: No Ecchymosis: No Erythema: No Erythema: No Hemosiderin Staining: No Hemosiderin Staining: No Mottled: No Mottled: No Pallor: No Pallor: No Rubor: No Rubor: No Temperature: No Abnormality N/A No Abnormality Tenderness on Palpation: No No No Wound Preparation: Ulcer Cleansing: N/A Ulcer Cleansing: Rinsed/Irrigated with Saline Rinsed/Irrigated with Saline Topical Anesthetic Applied: Topical Anesthetic Applied: Other: lidocaine 4% Other: lidocaine 4% Procedures Performed: Debridement N/A N/A Wound Number: 5 6 N/A Photos: No Photos No Photos N/A Wound Location: Left Toe Great Left Toe Second N/A Wounding Event: Gradually Appeared Gradually Appeared N/A Primary Etiology:  Diabetic Wound/Ulcer of the Diabetic Wound/Ulcer of the N/A Lower Extremity Lower Extremity Comorbid History: Anemia, Lymphedema, Anemia, Lymphedema, N/A Congestive Heart Failure, Congestive Heart Failure, Hypertension, Peripheral Hypertension, Peripheral [name], [name] ([id number]) Venous Disease, Type II Venous Disease, Type II Diabetes, Neuropathy Diabetes, Neuropathy Date Acquired: 06/11/2016 06/11/2016 N/A Weeks of Treatment: 44 44 N/A Wound Status: Open Open N/A Pending Amputation on Yes Yes N/A Presentation: Measurements L x W x D 1.2x1.5x0.1 1.6x2.3x0.1 N/A (cm) Area (cm) : 1.414 2.89 N/A Volume (cm) : 0.141 0.289 N/A % Reduction in Area: 90.10% -35.30% N/A % Reduction in Volume: 90.10% -35.00% N/A Classification: Grade 2 Grade 2 N/A Exudate Amount: Large Large N/A Exudate Type: Serosanguineous Serosanguineous N/A Exudate Color: red, brown red, brown N/A Foul Odor After Cleansing: Yes No N/A Odor Anticipated Due to No N/A N/A Product Use: Wound Margin: Flat and Intact Flat and Intact N/A Granulation Amount: Large (67-100%) Large (67-100%) N/A Granulation Quality: Red Red, Hyper-granulation N/A Necrotic Amount: Small (1-33%) Small (1-33%) N/A Necrotic Tissue: Adherent [place] N/A Exposed Structures: Fat Layer (Subcutaneous Fat Layer (Subcutaneous N/A Tissue) Exposed: Yes Tissue) Exposed: Yes Fascia: No  Fascia: No Tendon: No Tendon: No Muscle: No Muscle: No Joint: No Joint: No Bone: No Bone: No Epithelialization: Small (1-33%) Small (1-33%) N/A Debridement: N/A N/A N/A Pain Control: N/A N/A N/A Tissue Debrided: N/A N/A N/A Level: N/A N/A N/A Debridement Area (sq cm): N/A N/A N/A Instrument: N/A N/A N/A Bleeding: N/A N/A N/A Hemostasis Achieved: N/A N/A N/A Procedural Pain: N/A N/A N/A Post Procedural Pain: N/A N/A N/A Debridement Treatment N/A N/A N/A Response: Post Debridement N/A N/A N/A Measurements L x W x D (cm) Post Debridement  Volume: N/A N/A N/A (cm) Periwound Skin Texture: Excoriation: No Excoriation: No N/A Induration: No Induration: No Callus: No Callus: No Crepitus: No Crepitus: No Rash: No Rash: No Scarring: No Scarring: No Periwound Skin Moisture: N/A [name], [name] ([id number]) Maceration: Yes Maceration: Yes Dry/Scaly: No Dry/Scaly: No Periwound Skin Color: Atrophie Blanche: No Atrophie Blanche: No N/A Cyanosis: No Cyanosis: No Ecchymosis: No Ecchymosis: No Erythema: No Erythema: No Hemosiderin Staining: No Hemosiderin Staining: No Mottled: No Mottled: No Pallor: No Pallor: No Rubor: No Rubor: No Temperature: No Abnormality No Abnormality N/A Tenderness on Palpation: No No N/A Wound Preparation: Ulcer Cleansing: Ulcer Cleansing: N/A Rinsed/Irrigated with Saline Rinsed/Irrigated with Saline Topical Anesthetic Applied: Topical Anesthetic Applied: Other: lidocaine 4% Other: lidocaine 4% Procedures Performed: N/A N/A N/A Treatment Notes Electronic Signature(s) Signed: 08/01/2017 10:20:43 AM By: Lawanda Cousins Entered By: Lawanda Cousins on 08/01/2017 10:20:42 [name], [name] ([id number]) -------------------------------------------------------------------------------- [place] Details Patient Name: Flatt, Ryaan E. Date of Service: 08/01/2017 9:15 AM Medical Record Number: [id number] Patient Account Number: [id number] Date of Birth/Sex: 03/22/1951 (67 y.o. M) Treating RN: Ahmed Prima Primary Care Ioana Louks: Lamonte Sakai Other Clinician: Referring Mirabelle Cyphers: Lamonte Sakai Treating Laina Guerrieri/Extender: Cathie Olden in Treatment: 3 Active Inactive ` Abuse / Safety / Falls / Self Care Management Nursing Diagnoses: Potential for falls Goals: Patient will remain injury free related to falls Date Initiated: 09/21/2016 Target Resolution Date: 09/14/2017 Goal Status: Active Interventions: Assess fall risk on admission and as  needed Notes: ` Nutrition Nursing Diagnoses: Potential for alteratiion in Nutrition/Potential for imbalanced nutrition Goals: Patient/caregiver agrees to and verbalizes understanding of need to use nutritional supplements and/or vitamins as prescribed Date Initiated: 09/21/2016 Target Resolution Date: 09/14/2017 Goal Status: Active Interventions: Assess patient nutrition upon admission and as needed per policy Notes: ` Orientation to the Wound Care Program Nursing Diagnoses: Knowledge deficit related to the wound healing center program Goals: Patient/caregiver will verbalize understanding of the [place] Date Initiated: 09/21/2016 Target Resolution Date: 06/15/2017 Goal Status: Active Interventions: DARKAtilano, Covelli ([id number]) Provide education on orientation to the wound center Notes: ` Wound/Skin Impairment Nursing Diagnoses: Knowledge deficit related to smoking impact on wound healing Goals: Ulcer/skin breakdown will have a volume reduction of 30% by week 4 Date Initiated: 09/21/2016 Target Resolution Date: 08/17/2017 Goal Status: Active Ulcer/skin breakdown will have a volume reduction of 50% by week 8 Date Initiated: 09/21/2016 Target Resolution Date: 08/17/2017 Goal Status: Active Ulcer/skin breakdown will have a volume reduction of 80% by week 12 Date Initiated: 09/21/2016 Target Resolution Date: 09/14/2017 Goal Status: Active Ulcer/skin breakdown will heal within 14 weeks Date Initiated: 09/21/2016 Target Resolution Date: 08/17/2017 Goal Status: Active Interventions: Assess patient/caregiver ability to obtain necessary supplies Assess patient/caregiver ability to perform ulcer/skin care regimen upon admission and as needed Assess ulceration(s) every visit Notes: Electronic Signature(s) Signed: 08/02/2017 4:29:16 PM By: Alric Quan Entered By: Alric Quan on 08/01/2017 09:50:16 [name], [name]  ([id number]) -------------------------------------------------------------------------------- Pain Assessment  Details Patient Name: Merkin, RAJ LANDRESS. Date of Service: 08/01/2017 9:15 AM Medical Record Number: [id number] Patient Account Number: [id number] Date of Birth/Sex: January 14, 1951 (67 y.o. M) Treating RN: Montey Hora Primary Care Kyshawn Teal: Lamonte Sakai Other Clinician: Referring Jamesrobert Ohanesian: Lamonte Sakai Treating Kimm Sider/Extender: Cathie Olden in Treatment: 5 Active Problems Location of Pain Severity and Description of Pain Patient Has Paino No Site Locations Pain Management and Medication Current Pain Management: Electronic Signature(s) Signed: 08/01/2017 3:02:22 PM By: Montey Hora Entered By: Montey Hora on 08/01/2017 09:32:50 Yim, [name] ([id number]) -------------------------------------------------------------------------------- Patient/Caregiver Education Details Patient Name: Tullius, Jt E. Date of Service: 08/01/2017 9:15 AM Medical Record Number: [id number] Patient Account Number: [id number] Date of Birth/Gender: [birth year] (67 y.o. M) Treating RN: Roger Shelter Primary Care Physician: Lamonte Sakai Other Clinician: Referring Physician: Lamonte Sakai Treating Physician/Extender: Cathie Olden in Treatment: 22 Education Assessment Education Provided To: Patient Education Topics Provided Wound Debridement: Handouts: Wound Debridement Methods: Explain/Verbal Responses: State content correctly Wound/Skin Impairment: Handouts: Caring for Your Ulcer Methods: Explain/Verbal Responses: State content correctly Electronic Signature(s) Signed: 08/01/2017 3:28:21 PM By: Roger Shelter Entered By: Roger Shelter on 08/01/2017 10:25:11 [name], [name] ([id number]) -------------------------------------------------------------------------------- Wound Assessment Details Patient Name: [name], [name] Date of Service: 08/01/2017 9:15 AM Medical Record  Number: [id number] Patient Account Number: [id number] Date of Birth/Sex: 1950-08-26 (67 y.o. M) Treating RN: Montey Hora Primary Care Ainhoa Rallo: Lamonte Sakai Other Clinician: Referring Deniece Rankin: Lamonte Sakai Treating Rhian Funari/Extender: Cathie Olden in Treatment: 44 Wound Status Wound Number: 1 Primary Diabetic Wound/Ulcer of the Lower Extremity Etiology: Wound Location: Right Toe Great Wound Open Wounding Event: Gradually Appeared Status: Date Acquired: 06/11/2016 Comorbid Anemia, Lymphedema, Congestive Heart Weeks Of Treatment: 44 History: Failure, Hypertension, Peripheral Venous Clustered Wound: No Disease, Type II Diabetes, Neuropathy Pending Amputation On Presentation Photos Photo Uploaded By: Montey Hora on 08/01/2017 14:57:46 Wound Measurements Length: (cm) 0.4 Width: (cm) 0.5 Depth: (cm) 0.1 Area: (cm) 0.157 Volume: (cm) 0.016 % Reduction in Area: 99.7% % Reduction in Volume: 99.7% Epithelialization: Small (1-33%) Tunneling: No Undermining: No Wound Description Classification: Grade 2 Wound Margin: Flat and Intact Exudate Amount: Large Exudate Type: Serosanguineous Exudate Color: red, brown Foul Odor After Cleansing: No Slough/Fibrino No Wound Bed Granulation Amount: Large (67-100%) Exposed Structure Granulation Quality: Red Fascia Exposed: No Necrotic Amount: Small (1-33%) Fat Layer (Subcutaneous Tissue) Exposed: Yes Necrotic Quality: Eschar, Adherent Slough Tendon Exposed: No Muscle Exposed: No Joint Exposed: No Bone Exposed: No Periwound Skin Texture Fillion, Zailyn E. ([id number]) Texture Color No Abnormalities Noted: No No Abnormalities Noted: No Callus: Yes Atrophie Blanche: No Crepitus: No Cyanosis: No Excoriation: Yes Ecchymosis: No Induration: No Erythema: No Rash: No Hemosiderin Staining: No Scarring: No Mottled: No Pallor: No Moisture Rubor: No No Abnormalities Noted: No Dry / Scaly: No Temperature / Pain Maceration:  Yes Temperature: No Abnormality Wound Preparation Ulcer Cleansing: Rinsed/Irrigated with Saline Topical Anesthetic Applied: Other: lidocaine 4%, Electronic Signature(s) Signed: 08/01/2017 3:02:22 PM By: Montey Hora Entered By: Montey Hora on 08/01/2017 09:36:17 [name], [name] ([id number]) -------------------------------------------------------------------------------- Wound Assessment Details Patient Name: [name], [name] Date of Service: 08/01/2017 9:15 AM Medical Record Number: [id number] Patient Account Number: [id number] Date of Birth/Sex: 1950/12/16 (67 y.o. M) Treating RN: Montey Hora Primary Care Erabella Kuipers: Lamonte Sakai Other Clinician: Referring Maryanna Stuber: Lamonte Sakai Treating Hanley Woerner/Extender: Cathie Olden in Treatment: 44 Wound Status Wound Number: 12 Primary Diabetic Wound/Ulcer of the Lower Etiology: Extremity Wound Location: Right, Dorsal Foot Wound Status: Healed - Epithelialized Wounding Event: Gradually Appeared Date Acquired: 07/25/2017 Weeks  Of Treatment: 1 Clustered Wound: No Photos Photo Uploaded By: Montey Hora on 08/01/2017 14:57:46 Wound Measurements Length: (cm) 0 % Width: (cm) 0 % Depth: (cm) 0 Area: (cm) 0 Volume: (cm) 0 Reduction in Area: 100% Reduction in Volume: 100% Wound Description Classification: Grade 1 Periwound Skin Texture Texture Color No Abnormalities Noted: No No Abnormalities Noted: No Moisture No Abnormalities Noted: No Electronic Signature(s) Signed: 08/01/2017 3:02:22 PM By: Montey Hora Entered By: Montey Hora on 08/01/2017 09:36:39 [name], [name] ([id number]) -------------------------------------------------------------------------------- Wound Assessment Details Patient Name: Lichtman, Araf E. Date of Service: 08/01/2017 9:15 AM Medical Record Number: [id number] Patient Account Number: [id number] Date of Birth/Sex: 02/25/1951 (67 y.o. M) Treating RN: Montey Hora Primary Care Sae Handrich: Lamonte Sakai Other Clinician: Referring Latavion Halls: Lamonte Sakai Treating Sharea Guinther/Extender: Cathie Olden in Treatment: 44 Wound Status Wound Number: 2 Primary Diabetic Wound/Ulcer of the Lower Extremity Etiology: Wound Location: Right Toe Second Wound Open Wounding Event: Gradually Appeared Status: Date Acquired: 06/11/2016 Comorbid Anemia, Lymphedema, Congestive Heart Weeks Of Treatment: 44 History: Failure, Hypertension, Peripheral Venous Clustered Wound: No Disease, Type II Diabetes, Neuropathy Pending Amputation On Presentation Photos Photo Uploaded By: Montey Hora on 08/01/2017 14:58:40 Wound Measurements Length: (cm) 0.5 Width: (cm) 1.6 Depth: (cm) 0.1 Area: (cm) 0.628 Volume: (cm) 0.063 % Reduction in Area: 79.6% % Reduction in Volume: 79.5% Epithelialization: Medium (34-66%) Tunneling: No Undermining: No Wound Description Classification: Grade 2 Wound Margin: Flat and Intact Exudate Amount: Large Exudate Type: Serosanguineous Exudate Color: red, brown Foul Odor After Cleansing: No Slough/Fibrino No Wound Bed Granulation Amount: Large (67-100%) Exposed Structure Granulation Quality: Red Fascia Exposed: No Necrotic Amount: Small (1-33%) Fat Layer (Subcutaneous Tissue) Exposed: Yes Necrotic Quality: Adherent Slough Tendon Exposed: No Muscle Exposed: No Joint Exposed: No Bone Exposed: No Periwound Skin Texture Sanden, Zakari E. ([id number]) Texture Color No Abnormalities Noted: No No Abnormalities Noted: No Callus: No Atrophie Blanche: No Crepitus: No Cyanosis: No Excoriation: No Ecchymosis: No Induration: No Erythema: No Rash: No Hemosiderin Staining: No Scarring: Yes Mottled: No Pallor: No Moisture Rubor: No No Abnormalities Noted: No Dry / Scaly: No Temperature / Pain Maceration: Yes Temperature: No Abnormality Wound Preparation Ulcer Cleansing: Rinsed/Irrigated with Saline Topical Anesthetic Applied: Other: lidocaine  4%, Electronic Signature(s) Signed: 08/01/2017 3:02:22 PM By: Montey Hora Entered By: Montey Hora on 08/01/2017 09:37:22 Archey, [name] ([id number]) -------------------------------------------------------------------------------- Wound Assessment Details Patient Name: Soh, Dreyson E. Date of Service: 08/01/2017 9:15 AM Medical Record Number: [id number] Patient Account Number: [id number] Date of Birth/Sex: June 13, 1950 (67 y.o. M) Treating RN: Montey Hora Primary Care Maanya Hippert: Lamonte Sakai Other Clinician: Referring Tali Coster: Lamonte Sakai Treating Nalia Honeycutt/Extender: Cathie Olden in Treatment: 44 Wound Status Wound Number: 5 Primary Diabetic Wound/Ulcer of the Lower Extremity Etiology: Wound Location: Left Toe Great Wound Open Wounding Event: Gradually Appeared Status: Date Acquired: 06/11/2016 Comorbid Anemia, Lymphedema, Congestive Heart Weeks Of Treatment: 44 History: Failure, Hypertension, Peripheral Venous Clustered Wound: No Disease, Type II Diabetes, Neuropathy Pending Amputation On Presentation Photos Photo Uploaded By: Montey Hora on 08/01/2017 14:58:41 Wound Measurements Length: (cm) 1.2 Width: (cm) 1.5 Depth: (cm) 0.1 Area: (cm) 1.414 Volume: (cm) 0.141 % Reduction in Area: 90.1% % Reduction in Volume: 90.1% Epithelialization: Small (1-33%) Tunneling: No Undermining: No Wound Description Classification: Grade 2 Wound Margin: Flat and Intact Exudate Amount: Large Exudate Type: Serosanguineous Exudate Color: red, brown Foul Odor After Cleansing: Yes Due to Product Use: No Slough/Fibrino No Wound Bed Granulation Amount: Large (67-100%) Exposed Structure Granulation Quality: Red Fascia Exposed: No Necrotic Amount: Small (  1-33%) Fat Layer (Subcutaneous Tissue) Exposed: Yes Necrotic Quality: Adherent Slough Tendon Exposed: No Muscle Exposed: No Joint Exposed: No Bone Exposed: No Periwound Skin Texture Argueta, Lavance E.  ([id number]) Texture Color No Abnormalities Noted: No No Abnormalities Noted: No Callus: No Atrophie Blanche: No Crepitus: No Cyanosis: No Excoriation: No Ecchymosis: No Induration: No Erythema: No Rash: No Hemosiderin Staining: No Scarring: No Mottled: No Pallor: No Moisture Rubor: No No Abnormalities Noted: No Dry / Scaly: No Temperature / Pain Maceration: Yes Temperature: No Abnormality Wound Preparation Ulcer Cleansing: Rinsed/Irrigated with Saline Topical Anesthetic Applied: Other: lidocaine 4%, Electronic Signature(s) Signed: 08/01/2017 3:02:22 PM By: Montey Hora Entered By: Montey Hora on 08/01/2017 09:38:13 Magley, [name] ([id number]) -------------------------------------------------------------------------------- Wound Assessment Details Patient Name: Macera, Dillian E. Date of Service: 08/01/2017 9:15 AM Medical Record Number: [id number] Patient Account Number: [id number] Date of Birth/Sex: [birth year] (67 y.o. M) Treating RN: Montey Hora Primary Care Yates Weisgerber: Lamonte Sakai Other Clinician: Referring Suhey Radford: Lamonte Sakai Treating Irving Bloor/Extender: Cathie Olden in Treatment: 44 Wound Status Wound Number: 6 Primary Diabetic Wound/Ulcer of the Lower Extremity Etiology: Wound Location: Left Toe Second Wound Open Wounding Event: Gradually Appeared Status: Date Acquired: 06/11/2016 Comorbid Anemia, Lymphedema, Congestive Heart Weeks Of Treatment: 44 History: Failure, Hypertension, Peripheral Venous Clustered Wound: No Disease, Type II Diabetes, Neuropathy Pending Amputation On Presentation Photos Photo Uploaded By: Montey Hora on 08/01/2017 14:59:22 Wound Measurements Length: (cm) 1.6 Width: (cm) 2.3 Depth: (cm) 0.1 Area: (cm) 2.89 Volume: (cm) 0.289 % Reduction in Area: -35.3% % Reduction in Volume: -35% Epithelialization: Small (1-33%) Tunneling: No Undermining: No Wound Description Classification: Grade 2 Wound Margin: Flat  and Intact Exudate Amount: Large Exudate Type: Serosanguineous Exudate Color: red, brown Foul Odor After Cleansing: No Slough/Fibrino No Wound Bed Granulation Amount: Large (67-100%) Exposed Structure Granulation Quality: Red, Hyper-granulation Fascia Exposed: No Necrotic Amount: Small (1-33%) Fat Layer (Subcutaneous Tissue) Exposed: Yes Necrotic Quality: Adherent Slough Tendon Exposed: No Muscle Exposed: No Joint Exposed: No Bone Exposed: No Periwound Skin Texture Fronek, Quadre E. ([id number]) Texture Color No Abnormalities Noted: No No Abnormalities Noted: No Callus: No Atrophie Blanche: No Crepitus: No Cyanosis: No Excoriation: No Ecchymosis: No Induration: No Erythema: No Rash: No Hemosiderin Staining: No Scarring: No Mottled: No Pallor: No Moisture Rubor: No No Abnormalities Noted: No Dry / Scaly: No Temperature / Pain Maceration: Yes Temperature: No Abnormality Wound Preparation Ulcer Cleansing: Rinsed/Irrigated with Saline Topical Anesthetic Applied: Other: lidocaine 4%, Electronic Signature(s) Signed: 08/01/2017 3:02:22 PM By: Montey Hora Entered By: Montey Hora on 08/01/2017 09:39:33 Hopkinson, [name] ([id number]) -------------------------------------------------------------------------------- Vitals Details Patient Name: Ates, Gurjot E. Date of Service: 08/01/2017 9:15 AM Medical Record Number: [id number] Patient Account Number: [id number] Date of Birth/Sex: [birth year] (67 y.o. M) Treating RN: Montey Hora Primary Care Kyriana Yankee: Lamonte Sakai Other Clinician: Referring Giulio Bertino: Lamonte Sakai Treating Ravynn Hogate/Extender: Cathie Olden in Treatment: 57 Vital Signs Time Taken: 09:32 Temperature (F): 98.2 Height (in): 69 Pulse (bpm): 59 Weight (lbs): 168 Respiratory Rate (breaths/min): 18 Body Mass Index (BMI): 24.8 Blood Pressure (mmHg): 158/78 Reference Range: 80 - 120 mg / dl Electronic Signature(s) Signed: 08/01/2017 3:02:22 PM By:  Montey Hora Entered By: Montey Hora on 08/01/2017 09:33:07

## 2017-08-08 ENCOUNTER — Encounter: Payer: Medicare HMO | Attending: Nurse Practitioner | Admitting: Nurse Practitioner

## 2017-08-08 DIAGNOSIS — L97522 Non-pressure chronic ulcer of other part of left foot with fat layer exposed: Secondary | ICD-10-CM | POA: Diagnosis not present

## 2017-08-08 DIAGNOSIS — L97512 Non-pressure chronic ulcer of other part of right foot with fat layer exposed: Secondary | ICD-10-CM | POA: Diagnosis not present

## 2017-08-08 DIAGNOSIS — F1010 Alcohol abuse, uncomplicated: Secondary | ICD-10-CM | POA: Diagnosis not present

## 2017-08-08 DIAGNOSIS — I87313 Chronic venous hypertension (idiopathic) with ulcer of bilateral lower extremity: Secondary | ICD-10-CM | POA: Insufficient documentation

## 2017-08-08 DIAGNOSIS — F17210 Nicotine dependence, cigarettes, uncomplicated: Secondary | ICD-10-CM | POA: Insufficient documentation

## 2017-08-08 DIAGNOSIS — I890 Lymphedema, not elsewhere classified: Secondary | ICD-10-CM | POA: Insufficient documentation

## 2017-08-08 DIAGNOSIS — E1169 Type 2 diabetes mellitus with other specified complication: Secondary | ICD-10-CM | POA: Insufficient documentation

## 2017-08-08 DIAGNOSIS — E11621 Type 2 diabetes mellitus with foot ulcer: Secondary | ICD-10-CM | POA: Diagnosis present

## 2017-08-08 DIAGNOSIS — E1151 Type 2 diabetes mellitus with diabetic peripheral angiopathy without gangrene: Secondary | ICD-10-CM | POA: Diagnosis not present

## 2017-08-08 DIAGNOSIS — M86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, right ankle and foot: Secondary | ICD-10-CM | POA: Insufficient documentation

## 2017-08-10 NOTE — Progress Notes (Signed)
KATHERINE, SYME ([id number]) Visit Report for 08/05/2017 Arrival Information Details Patient Name: [name], [name] Date of Service: 08/05/2017 8:00 AM Medical Record Number: [id number] Patient Account Number: [id number] Date of Birth/Sex: 12/07/1950 (67 y.o. M) Treating RN: Ahmed Prima Primary Care Bailei Buist: Lamonte Sakai Other Clinician: Referring Keywon Mestre: Lamonte Sakai Treating Marylynn Rigdon/Extender: Melburn Hake, HOYT Weeks in Treatment: 76 Visit Information History Since Last Visit Added or deleted any medications: No Patient Arrived: Ambulatory Any new allergies or adverse reactions: No Arrival Time: 08:27 Had a fall or experienced change in No Accompanied By: brother in activities of daily living that may affect law risk of falls: Transfer Assistance: None Signs or symptoms of abuse/neglect since last visito No Patient Identification Verified: Yes Hospitalized since last visit: No Secondary Verification Process Completed: Yes Implantable device outside of the clinic excluding No Patient Requires Transmission-Based No cellular tissue based products placed in the center Precautions: since last visit: Patient Has Alerts: Yes Has Dressing in Place as Prescribed: Yes Patient Alerts: DMII Has Compression in Place as Prescribed: Yes Pain Present Now: No Electronic Signature(s) Signed: 08/07/2017 4:27:21 PM By: Alric Quan Entered By: Alric Quan on 08/05/2017 08:30:26 Yablonski, [name] ([id number]) -------------------------------------------------------------------------------- Clinic Level of Care Assessment Details Patient Name: Truex, Clare E. Date of Service: 08/05/2017 8:00 AM Medical Record Number: [id number] Patient Account Number: [id number] Date of Birth/Sex: 08-05-1950 (67 y.o. M) Treating RN: Ahmed Prima Primary Care Kizer Nobbe: Lamonte Sakai Other Clinician: Referring Khaidyn Staebell: Lamonte Sakai Treating Ameria Sanjurjo/Extender: Melburn Hake, HOYT Weeks in Treatment:  11 Clinic Level of Care Assessment Items TOOL 4 Quantity Score []  - Use when only an EandM is performed on FOLLOW-UP visit 0 ASSESSMENTS - Nursing Assessment / Reassessment X - Reassessment of Co-morbidities (includes updates in patient status) 1 10 X- 1 5 Reassessment of Adherence to Treatment Plan ASSESSMENTS - Wound and Skin Assessment / Reassessment []  - Simple Wound Assessment / Reassessment - one wound 0 X- 4 5 Complex Wound Assessment / Reassessment - multiple wounds []  - 0 Dermatologic / Skin Assessment (not related to wound area) ASSESSMENTS - Focused Assessment []  - Circumferential Edema Measurements - multi extremities 0 []  - 0 Nutritional Assessment / Counseling / Intervention []  - 0 Lower Extremity Assessment (monofilament, tuning fork, pulses) []  - 0 Peripheral Arterial Disease Assessment (using hand held doppler) ASSESSMENTS - Ostomy and/or Continence Assessment and Care []  - Incontinence Assessment and Management 0 []  - 0 Ostomy Care Assessment and Management (repouching, etc.) PROCESS - Coordination of Care X - Simple Patient / Family Education for ongoing care 1 15 []  - 0 Complex (extensive) Patient / Family Education for ongoing care []  - 0 Staff obtains Programmer, systems, Records, Test Results / Process Orders []  - 0 Staff telephones HHA, Nursing Homes / Clarify orders / etc []  - 0 Routine Transfer to another Facility (non-emergent condition) []  - 0 Routine Hospital Admission (non-emergent condition) []  - 0 New Admissions / Biomedical engineer / Ordering NPWT, Apligraf, etc. []  - 0 Emergency Hospital Admission (emergent condition) X- 1 10 Simple Discharge Coordination Hinchey, Macguire E. ([id number]) []  - 0 Complex (extensive) Discharge Coordination PROCESS - Special Needs []  - Pediatric / Minor Patient Management 0 []  - 0 Isolation Patient Management []  - 0 Hearing / Language / Visual special needs []  - 0 Assessment of Community assistance  (transportation, D/C planning, etc.) []  - 0 Additional assistance / Altered mentation []  - 0 Support Surface(s) Assessment (bed, cushion, seat, etc.) INTERVENTIONS - Wound Cleansing / Measurement []  - Simple Wound  Cleansing - one wound 0 X- 4 5 Complex Wound Cleansing - multiple wounds X- 1 5 Wound Imaging (photographs - any number of wounds) []  - 0 Wound Tracing (instead of photographs) []  - 0 Simple Wound Measurement - one wound X- 4 5 Complex Wound Measurement - multiple wounds INTERVENTIONS - Wound Dressings X - Small Wound Dressing one or multiple wounds 4 10 []  - 0 Medium Wound Dressing one or multiple wounds []  - 0 Large Wound Dressing one or multiple wounds []  - 0 Application of Medications - topical []  - 0 Application of Medications - injection INTERVENTIONS - Miscellaneous []  - External ear exam 0 []  - 0 Specimen Collection (cultures, biopsies, blood, body fluids, etc.) []  - 0 Specimen(s) / Culture(s) sent or taken to Lab for analysis []  - 0 Patient Transfer (multiple staff / Civil Service fast streamer / Similar devices) []  - 0 Simple Staple / Suture removal (25 or less) []  - 0 Complex Staple / Suture removal (26 or more) []  - 0 Hypo / Hyperglycemic Management (close monitor of Blood Glucose) []  - 0 Ankle / Brachial Index (ABI) - do not check if billed separately X- 1 5 Vital Signs Odonell, Ziv E. ([id number]) Has the patient been seen at the hospital within the last three years: Yes Total Score: 150 Level Of Care: New/Established - Level 4 Electronic Signature(s) Signed: 08/07/2017 4:27:21 PM By: Alric Quan Entered By: Alric Quan on 08/05/2017 08:34:53 Bircher, [name] ([id number]) -------------------------------------------------------------------------------- Encounter Discharge Information Details Patient Name: Zollars, Renner E. Date of Service: 08/05/2017 8:00 AM Medical Record Number: [id number] Patient Account Number: [id number] Date of Birth/Sex:  [birth year] (67 y.o. M) Treating RN: Ahmed Prima Primary Care Sudeep Scheibel: Lamonte Sakai Other Clinician: Referring Adren Dollins: Lamonte Sakai Treating Wilberto Console/Extender: Melburn Hake, HOYT Weeks in Treatment: 65 Encounter Discharge Information Items Discharge Pain Level: 0 Discharge Condition: Stable Ambulatory Status: Ambulatory Discharge Destination: Home Transportation: Private Auto brother in Accompanied By: law Schedule Follow-up Appointment: Yes Medication Reconciliation completed and No provided to Patient/Care Bennye Nix: Clinical Summary of Care: Electronic Signature(s) Signed: 08/07/2017 4:27:21 PM By: Alric Quan Entered By: Alric Quan on 08/05/2017 08:34:17 Reser, [name] ([id number]) -------------------------------------------------------------------------------- Patient/Caregiver Education Details Patient Name: [name]. Date of Service: 08/05/2017 8:00 AM Medical Record Number: [id number] Patient Account Number: [id number] Date of Birth/Gender: 01-20-1951 (67 y.o. M) Treating RN: Ahmed Prima Primary Care Physician: Lamonte Sakai Other Clinician: Referring Physician: Lamonte Sakai Treating Physician/Extender: Sharalyn Ink in Treatment: 27 Education Assessment Education Provided To: Patient Education Topics Provided Wound/Skin Impairment: Handouts: Other: wound care as ordered Methods: Demonstration, Explain/Verbal Responses: State content correctly Electronic Signature(s) Signed: 08/07/2017 4:27:21 PM By: Alric Quan Entered By: Alric Quan on 08/05/2017 08:33:57 Hollon, [name] ([id number]) -------------------------------------------------------------------------------- Wound Assessment Details Patient Name: Hilligoss, Cheikh E. Date of Service: 08/05/2017 8:00 AM Medical Record Number: [id number] Patient Account Number: [id number] Date of Birth/Sex: 02-Jun-1950 (67 y.o. M) Treating RN: Ahmed Prima Primary Care Analissa Bayless: Lamonte Sakai Other Clinician: Referring Lilymarie Scroggins: Lamonte Sakai Treating Margan Elias/Extender: Melburn Hake, HOYT Weeks in Treatment: 38 Wound Status Wound Number: 1 Primary Diabetic Wound/Ulcer of the Lower Extremity Etiology: Wound Location: Right Toe Great Wound Open Wounding Event: Gradually Appeared Status: Date Acquired: 06/11/2016 Comorbid Anemia, Lymphedema, Congestive Heart Weeks Of Treatment: 45 History: Failure, Hypertension, Peripheral Venous Clustered Wound: No Disease, Type II Diabetes, Neuropathy Pending Amputation On Presentation Photos Photo Uploaded By: Montey Hora on 08/05/2017 09:00:56 Wound Measurements Length: (cm) 0.4 Width: (cm) 0.5 Depth: (cm) 0.1 Area: (cm) 0.157 Volume: (cm)  0.016 % Reduction in Area: 99.7% % Reduction in Volume: 99.7% Epithelialization: Small (1-33%) Tunneling: No Undermining: No Wound Description Classification: Grade 2 Wound Margin: Flat and Intact Exudate Amount: Large Exudate Type: Serosanguineous Exudate Color: red, brown Foul Odor After Cleansing: No Slough/Fibrino No Wound Bed Granulation Amount: Large (67-100%) Exposed Structure Granulation Quality: Red Fascia Exposed: No Necrotic Amount: Small (1-33%) Fat Layer (Subcutaneous Tissue) Exposed: Yes Necrotic Quality: Eschar, Adherent Slough Tendon Exposed: No Muscle Exposed: No Joint Exposed: No Bone Exposed: No Periwound Skin Texture Dauber, Aspen E. ([id number]) Texture Color No Abnormalities Noted: No No Abnormalities Noted: No Callus: Yes Atrophie Blanche: No Crepitus: No Cyanosis: No Excoriation: Yes Ecchymosis: No Induration: No Erythema: No Rash: No Hemosiderin Staining: No Scarring: No Mottled: No Pallor: No Moisture Rubor: No No Abnormalities Noted: No Dry / Scaly: No Temperature / Pain Maceration: Yes Temperature: No Abnormality Wound Preparation Ulcer Cleansing: Rinsed/Irrigated with Saline, Other: soap and water, Topical Anesthetic  Applied: None Electronic Signature(s) Signed: 08/07/2017 4:27:21 PM By: Alric Quan Entered By: Alric Quan on 08/05/2017 08:31:22 Berkery, Zacary E. ([id number]) -------------------------------------------------------------------------------- Wound Assessment Details Patient Name: Ulbricht, Dorell E. Date of Service: 08/05/2017 8:00 AM Medical Record Number: [id number] Patient Account Number: [id number] Date of Birth/Sex: [birth year] (67 y.o. M) Treating RN: Ahmed Prima Primary Care Katelen Luepke: Lamonte Sakai Other Clinician: Referring Josclyn Rosales: Lamonte Sakai Treating Kazi Reppond/Extender: Melburn Hake, HOYT Weeks in Treatment: 62 Wound Status Wound Number: 2 Primary Diabetic Wound/Ulcer of the Lower Extremity Etiology: Wound Location: Right Toe Second Wound Open Wounding Event: Gradually Appeared Status: Date Acquired: 06/11/2016 Comorbid Anemia, Lymphedema, Congestive Heart Weeks Of Treatment: 45 History: Failure, Hypertension, Peripheral Venous Clustered Wound: No Disease, Type II Diabetes, Neuropathy Pending Amputation On Presentation Photos Photo Uploaded By: Montey Hora on 08/05/2017 09:01:21 Wound Measurements Length: (cm) 0.5 Width: (cm) 1.6 Depth: (cm) 0.1 Area: (cm) 0.628 Volume: (cm) 0.063 % Reduction in Area: 79.6% % Reduction in Volume: 79.5% Epithelialization: Medium (34-66%) Tunneling: No Undermining: No Wound Description Classification: Grade 2 Wound Margin: Flat and Intact Exudate Amount: Large Exudate Type: Serosanguineous Exudate Color: red, brown Foul Odor After Cleansing: No Slough/Fibrino No Wound Bed Granulation Amount: Large (67-100%) Exposed Structure Granulation Quality: Red Fascia Exposed: No Necrotic Amount: Small (1-33%) Fat Layer (Subcutaneous Tissue) Exposed: Yes Necrotic Quality: Adherent Slough Tendon Exposed: No Muscle Exposed: No Joint Exposed: No Bone Exposed: No Periwound Skin Texture Lottman, Quay E. ([id number]) Texture  Color No Abnormalities Noted: No No Abnormalities Noted: No Callus: No Atrophie Blanche: No Crepitus: No Cyanosis: No Excoriation: No Ecchymosis: No Induration: No Erythema: No Rash: No Hemosiderin Staining: No Scarring: Yes Mottled: No Pallor: No Moisture Rubor: No No Abnormalities Noted: No Dry / Scaly: No Temperature / Pain Maceration: Yes Temperature: No Abnormality Wound Preparation Ulcer Cleansing: Rinsed/Irrigated with Saline, Other: soap and water, Topical Anesthetic Applied: None Electronic Signature(s) Signed: 08/07/2017 4:27:21 PM By: Alric Quan Entered By: Alric Quan on 08/05/2017 08:31:45 Burkard, Elliott E. ([id number]) -------------------------------------------------------------------------------- Wound Assessment Details Patient Name: Ungerer, Polk E. Date of Service: 08/05/2017 8:00 AM Medical Record Number: [id number] Patient Account Number: [id number] Date of Birth/Sex: 1951/02/09 (67 y.o. M) Treating RN: Ahmed Prima Primary Care Valentine Kuechle: Lamonte Sakai Other Clinician: Referring Shakita Keir: Lamonte Sakai Treating Emmaleah Meroney/Extender: Melburn Hake, HOYT Weeks in Treatment: 45 Wound Status Wound Number: 5 Primary Diabetic Wound/Ulcer of the Lower Extremity Etiology: Wound Location: Left Toe Great Wound Open Wounding Event: Gradually Appeared Status: Date Acquired: 06/11/2016 Comorbid Anemia, Lymphedema, Congestive Heart Weeks Of Treatment: 45 History: Failure, Hypertension, Peripheral Venous  Clustered Wound: No Disease, Type II Diabetes, Neuropathy Pending Amputation On Presentation Photos Photo Uploaded By: Montey Hora on 08/05/2017 09:01:22 Wound Measurements Length: (cm) 1.2 Width: (cm) 1.5 Depth: (cm) 0.1 Area: (cm) 1.414 Volume: (cm) 0.141 % Reduction in Area: 90.1% % Reduction in Volume: 90.1% Epithelialization: Small (1-33%) Tunneling: No Undermining: No Wound Description Classification: Grade 2 Wound Margin: Flat and  Intact Exudate Amount: Large Exudate Type: Serosanguineous Exudate Color: red, brown Foul Odor After Cleansing: Yes Due to Product Use: No Slough/Fibrino No Wound Bed Granulation Amount: Large (67-100%) Exposed Structure Granulation Quality: Red Fascia Exposed: No Necrotic Amount: Small (1-33%) Fat Layer (Subcutaneous Tissue) Exposed: Yes Necrotic Quality: Adherent Slough Tendon Exposed: No Muscle Exposed: No Joint Exposed: No Bone Exposed: No Periwound Skin Texture Leary, Reynol E. ([id number]) Texture Color No Abnormalities Noted: No No Abnormalities Noted: No Callus: No Atrophie Blanche: No Crepitus: No Cyanosis: No Excoriation: No Ecchymosis: No Induration: No Erythema: No Rash: No Hemosiderin Staining: No Scarring: No Mottled: No Pallor: No Moisture Rubor: No No Abnormalities Noted: No Dry / Scaly: No Temperature / Pain Maceration: Yes Temperature: No Abnormality Wound Preparation Ulcer Cleansing: Rinsed/Irrigated with Saline, Other: soap and water, Topical Anesthetic Applied: None Electronic Signature(s) Signed: 08/07/2017 4:27:21 PM By: Alric Quan Entered By: Alric Quan on 08/05/2017 08:32:18 Milne, Griffey E. ([id number]) -------------------------------------------------------------------------------- Wound Assessment Details Patient Name: Nicoll, Blanchard E. Date of Service: 08/05/2017 8:00 AM Medical Record Number: [id number] Patient Account Number: [id number] Date of Birth/Sex: 1950-05-01 (67 y.o. M) Treating RN: Ahmed Prima Primary Care Malaky Tetrault: Lamonte Sakai Other Clinician: Referring Aftan Vint: Lamonte Sakai Treating Antony Sian/Extender: Melburn Hake, HOYT Weeks in Treatment: 51 Wound Status Wound Number: 6 Primary Diabetic Wound/Ulcer of the Lower Extremity Etiology: Wound Location: Left Toe Second Wound Open Wounding Event: Gradually Appeared Status: Date Acquired: 06/11/2016 Comorbid Anemia, Lymphedema, Congestive Heart Weeks Of  Treatment: 45 History: Failure, Hypertension, Peripheral Venous Clustered Wound: No Disease, Type II Diabetes, Neuropathy Pending Amputation On Presentation Photos Photo Uploaded By: Montey Hora on 08/05/2017 09:01:45 Wound Measurements Length: (cm) 1.6 Width: (cm) 2.3 Depth: (cm) 0.1 Area: (cm) 2.89 Volume: (cm) 0.289 % Reduction in Area: -35.3% % Reduction in Volume: -35% Epithelialization: Small (1-33%) Tunneling: No Undermining: No Wound Description Classification: Grade 2 Wound Margin: Flat and Intact Exudate Amount: Large Exudate Type: Serosanguineous Exudate Color: red, brown Foul Odor After Cleansing: No Slough/Fibrino No Wound Bed Granulation Amount: Large (67-100%) Exposed Structure Granulation Quality: Red, Hyper-granulation Fascia Exposed: No Necrotic Amount: Small (1-33%) Fat Layer (Subcutaneous Tissue) Exposed: Yes Necrotic Quality: Adherent Slough Tendon Exposed: No Muscle Exposed: No Joint Exposed: No Bone Exposed: No Periwound Skin Texture Petz, Nefi E. ([id number]) Texture Color No Abnormalities Noted: No No Abnormalities Noted: No Callus: No Atrophie Blanche: No Crepitus: No Cyanosis: No Excoriation: No Ecchymosis: No Induration: No Erythema: No Rash: No Hemosiderin Staining: No Scarring: No Mottled: No Pallor: No Moisture Rubor: No No Abnormalities Noted: No Dry / Scaly: No Temperature / Pain Maceration: Yes Temperature: No Abnormality Wound Preparation Ulcer Cleansing: Rinsed/Irrigated with Saline, Other: soap and water, Electronic Signature(s) Signed: 08/07/2017 4:27:21 PM By: Alric Quan Entered By: Alric Quan on 08/05/2017 08:33:18

## 2017-08-12 DIAGNOSIS — E11621 Type 2 diabetes mellitus with foot ulcer: Secondary | ICD-10-CM | POA: Diagnosis not present

## 2017-08-13 NOTE — Progress Notes (Signed)
[name] ([id number]) Visit Report for 08/12/2017 Arrival Information Details Patient Name: [name], [name] Date of Service: 08/12/2017 9:15 AM Medical Record Number: [id number] Patient Account Number: [id number] Date of Birth/Sex: Apr 10, 1950 (67 y.o. M) Treating RN: [name] Primary Care [name]: [name] Other Clinician: Referring [name]: [name] Treating [name]/Extender: [name], [name] in Treatment: 52 Visit Information History Since Last Visit Added or deleted any medications: No Patient Arrived: Ambulatory Any new allergies or adverse reactions: No Arrival Time: 09:21 Had a fall or experienced change in No Accompanied By: friend activities of daily living that may affect Transfer Assistance: None risk of falls: Patient Identification Verified: Yes Signs or symptoms of abuse/neglect since last visito No Secondary Verification Process Completed: Yes Hospitalized since last visit: No Patient Requires Transmission-Based No Implantable device outside of the clinic excluding No Precautions: cellular tissue based products placed in the center Patient Has Alerts: Yes since last visit: Patient Alerts: DMII Has Dressing in Place as Prescribed: No Pain Present Now: No Electronic Signature(s) Signed: 08/12/2017 10:00:00 AM By: [name], BSN, RN, CWS, Kim RN, BSN Entered By: [name], BSN, RN, CWS, Kim on 08/12/2017 09:22:05 [name], [name] ([id number]) -------------------------------------------------------------------------------- Clinic Level of Care Assessment Details Patient Name: [name], [name] Date of Service: 08/12/2017 9:15 AM Medical Record Number: [id number] Patient Account Number: [id number] Date of Birth/Sex: 15-Oct-1950 (67 y.o. M) Treating RN: [name] Primary Care Song Myre: [name] Other Clinician: Referring Vega Stare: [name] Treating Alba Perillo/Extender: [name], [name] in Treatment: 30 Clinic Level of Care Assessment Items TOOL 4  Quantity Score []  - Use when only an EandM is performed on FOLLOW-UP visit 0 ASSESSMENTS - Nursing Assessment / Reassessment []  - Reassessment of Co-morbidities (includes updates in patient status) 0 X- 1 5 Reassessment of Adherence to Treatment Plan ASSESSMENTS - Wound and Skin Assessment / Reassessment []  - Simple Wound Assessment / Reassessment - one wound 0 X- 4 5 Complex Wound Assessment / Reassessment - multiple wounds []  - 0 Dermatologic / Skin Assessment (not related to wound area) ASSESSMENTS - Focused Assessment []  - Circumferential Edema Measurements - multi extremities 0 []  - 0 Nutritional Assessment / Counseling / Intervention []  - 0 Lower Extremity Assessment (monofilament, tuning fork, pulses) []  - 0 Peripheral Arterial Disease Assessment (using hand held doppler) ASSESSMENTS - Ostomy and/or Continence Assessment and Care []  - Incontinence Assessment and Management 0 []  - 0 Ostomy Care Assessment and Management (repouching, etc.) PROCESS - Coordination of Care X - Simple Patient / Family Education for ongoing care 1 15 []  - 0 Complex (extensive) Patient / Family Education for ongoing care []  - 0 Staff obtains Programmer, systems, Records, Test Results / Process Orders []  - 0 Staff telephones HHA, Nursing Homes / Clarify orders / etc []  - 0 Routine Transfer to another Facility (non-emergent condition) []  - 0 Routine Hospital Admission (non-emergent condition) []  - 0 New Admissions / Biomedical engineer / Ordering NPWT, Apligraf, etc. []  - 0 Emergency Hospital Admission (emergent condition) X- 1 10 Simple Discharge Coordination [name], [name]. ([id number]) []  - 0 Complex (extensive) Discharge Coordination PROCESS - Special Needs []  - Pediatric / Minor Patient Management 0 []  - 0 Isolation Patient Management []  - 0 Hearing / Language / Visual special needs []  - 0 Assessment of Community assistance (transportation, D/C planning, etc.) []  - 0 Additional  assistance / Altered mentation []  - 0 Support Surface(s) Assessment (bed, cushion, seat, etc.) INTERVENTIONS - Wound Cleansing / Measurement X - Simple Wound Cleansing -  one wound 1 5 []  - 0 Complex Wound Cleansing - multiple wounds X- 1 5 Wound Imaging (photographs - any number of wounds) []  - 0 Wound Tracing (instead of photographs) X- 1 5 Simple Wound Measurement - one wound []  - 0 Complex Wound Measurement - multiple wounds INTERVENTIONS - Wound Dressings []  - Small Wound Dressing one or multiple wounds 0 []  - 0 Medium Wound Dressing one or multiple wounds X- 1 20 Large Wound Dressing one or multiple wounds []  - 0 Application of Medications - topical []  - 0 Application of Medications - injection INTERVENTIONS - Miscellaneous []  - External ear exam 0 []  - 0 Specimen Collection (cultures, biopsies, blood, body fluids, etc.) []  - 0 Specimen(s) / Culture(s) sent or taken to Lab for analysis []  - 0 Patient Transfer (multiple staff / Civil Service fast streamer / Similar devices) []  - 0 Simple Staple / Suture removal (25 or less) []  - 0 Complex Staple / Suture removal (26 or more) []  - 0 Hypo / Hyperglycemic Management (close monitor of Blood Glucose) []  - 0 Ankle / Brachial Index (ABI) - do not check if billed separately []  - 0 Vital Signs [name], [name]. ([id number]) Has the patient been seen at the hospital within the last three years: Yes Total Score: 85 Level Of Care: New/Established - Level 3 Electronic Signature(s) Signed: 08/12/2017 10:30:21 AM By: [name], BSN, RN, CWS, Kim RN, BSN Entered By: [name], BSN, RN, CWS, Kim on 08/12/2017 10:18:35 Bolds, [name] ([id number]) -------------------------------------------------------------------------------- Encounter Discharge Information Details Patient Name: [name], [name] Date of Service: 08/12/2017 9:15 AM Medical Record Number: [id number] Patient Account Number: [id number] Date of Birth/Sex: Sep 18, 1950 (67 y.o. M) Treating RN:  [name] Primary Care Raelynne Ludwick: [name] Other Clinician: Referring Violanda Bobeck: [name] Treating Shaconda Hajduk/Extender: [name], [name] in Treatment: 51 Encounter Discharge Information Items Discharge Condition: Stable Ambulatory Status: Ambulatory Discharge Destination: Home Transportation: Private Auto Accompanied By: self Schedule Follow-up Appointment: Yes Clinical Summary of Care: Electronic Signature(s) Signed: 08/12/2017 10:17:38 AM By: [name], BSN, RN, CWS, Kim RN, BSN Entered By: [name], BSN, RN, CWS, Kim on 08/12/2017 10:17:37 Cuadros, [name] ([id number]) -------------------------------------------------------------------------------- Patient/Caregiver Education Details Patient Name: [name] Date of Service: 08/12/2017 9:15 AM Medical Record Number: [id number] Patient Account Number: [id number] Date of Birth/Gender: Aug 11, 1950 (67 y.o. M) Treating RN: [name] Primary Care Physician: [name] Other Clinician: Referring Physician: Lamonte [name] Treating Physician/Extender: Sharalyn Ink in Treatment: 68 Education Assessment Education Provided To: Patient Education Topics Provided Venous: Wound/Skin Impairment: Handouts: Caring for Your Ulcer, Other: continue wound care as prescribed Methods: Demonstration Responses: State content correctly Electronic Signature(s) Signed: 08/12/2017 10:30:21 AM By: [name], BSN, RN, CWS, Kim RN, BSN Entered By: [name], BSN, RN, CWS, Kim on 08/12/2017 10:17:18 Mika, [name] ([id number]) -------------------------------------------------------------------------------- Wound Assessment Details Patient Name: Zagami, [name] Date of Service: 08/12/2017 9:15 AM Medical Record Number: [id number] Patient Account Number: [id number] Date of Birth/Sex: 12-Jul-1950 (67 y.o. M) Treating RN: [name] Primary Care Yordi Krager: [name] Other Clinician: Referring Rossana Molchan: [name] Treating Paolo [name]/Extender: [name],  [name] in Treatment: 46 Wound Status Wound Number: 1 Primary Diabetic Wound/Ulcer of the Lower Etiology: Extremity Wound Location: Right Toe Great Wound Status: Open Wounding Event: Gradually Appeared Date Acquired: 06/11/2016 [name] Of Treatment: 46 Clustered Wound: No Pending Amputation On Presentation Photos Photo Uploaded By: [name], BSN, RN, CWS, Kim on 08/12/2017 11:35:41 Wound Measurements Length: (cm) 0.4 Width: (cm) 0.4 Depth: (cm) 0.1 Area: (cm) 0.126 Volume: (cm)  0.013 % Reduction in Area: 99.7% % Reduction in Volume: 99.7% Wound Description Classification: Grade 2 Periwound Skin Texture Texture Color No Abnormalities Noted: No No Abnormalities Noted: No Moisture No Abnormalities Noted: No Treatment Notes Wound #1 (Right Toe Great) 1. Cleansed with: Clean wound with Normal Saline 4. Dressing Applied: Hydrafera Gleed ([id number]) 5. Secondary Dressing Applied ABD Pad Kerlix/Conform 6. Footwear/Offloading device applied Surgical shoe 7. Secured with Tape Notes weave silvercell in between all toes, conform and coban with tape Electronic Signature(s) Signed: 08/12/2017 10:00:00 AM By: [name], BSN, RN, CWS, Kim RN, BSN Entered By: [name], BSN, RN, CWS, Kim on 08/12/2017 09:32:59 Diffley, [name] ([id number]) -------------------------------------------------------------------------------- Wound Assessment Details Patient Name: Bagg, [name] Date of Service: 08/12/2017 9:15 AM Medical Record Number: [id number] Patient Account Number: [id number] Date of Birth/Sex: Aug 18, 1950 (67 y.o. M) Treating RN: [name] Primary Care Majid Mccravy: [name] Other Clinician: Referring Gor Vestal: [name] Treating Diedra Sinor/Extender: [name], [name] in Treatment: 2 Wound Status Wound Number: 2 Primary Diabetic Wound/Ulcer of the Lower Etiology: Extremity Wound Location: Right Toe Second Wound Status: Open Wounding Event: Gradually  Appeared Date Acquired: 06/11/2016 [name] Of Treatment: 46 Clustered Wound: No Pending Amputation On Presentation Photos Photo Uploaded By: [name], BSN, RN, CWS, Kim on 08/12/2017 11:35:41 Wound Measurements Length: (cm) 0.5 Width: (cm) 1.7 Depth: (cm) 0.1 Area: (cm) 0.668 Volume: (cm) 0.067 % Reduction in Area: 78.2% % Reduction in Volume: 78.2% Wound Description Classification: Grade 2 Periwound Skin Texture Texture Color No Abnormalities Noted: No No Abnormalities Noted: No Moisture No Abnormalities Noted: No Treatment Notes Wound #2 (Right Toe Second) 1. Cleansed with: Clean wound with Normal Saline 4. Dressing Applied: Hydrafera Blue 5. Secondary Dressing Applied Agner, Armando [name]. ([id number]) ABD Pad Kerlix/Conform 6. Footwear/Offloading device applied Surgical shoe 7. Secured with Tape Notes weave silvercell in between all toes, conform and coban with tape Electronic Signature(s) Signed: 08/12/2017 10:00:00 AM By: [name], BSN, RN, CWS, Kim RN, BSN Entered By: [name], BSN, RN, CWS, Kim on 08/12/2017 09:32:59 Byrd, [name] ([id number]) -------------------------------------------------------------------------------- Wound Assessment Details Patient Name: Doggett, [name] Date of Service: 08/12/2017 9:15 AM Medical Record Number: [id number] Patient Account Number: [id number] Date of Birth/Sex: April 30, 1950 (67 y.o. M) Treating RN: [name] Primary Care Ceonna Frazzini: [name] Other Clinician: Referring Tupac Jeffus: [name] Treating Gwenyth Dingee/Extender: [name], [name] in Treatment: 77 Wound Status Wound Number: 5 Primary Diabetic Wound/Ulcer of the Lower Etiology: Extremity Wound Location: Left Toe Great Wound Status: Open Wounding Event: Gradually Appeared Date Acquired: 06/11/2016 [name] Of Treatment: 46 Clustered Wound: No Pending Amputation On Presentation Photos Photo Uploaded By: [name], BSN, RN, CWS, Kim on 08/12/2017 11:37:44 Wound  Measurements Length: (cm) 0.2 Width: (cm) 1 Depth: (cm) 0.1 Area: (cm) 0.157 Volume: (cm) 0.016 % Reduction in Area: 98.9% % Reduction in Volume: 98.9% Wound Description Classification: Grade 2 Periwound Skin Texture Texture Color No Abnormalities Noted: No No Abnormalities Noted: No Moisture No Abnormalities Noted: No Treatment Notes Wound #5 (Left Toe Great) 1. Cleansed with: Clean wound with Normal Saline 4. Dressing Applied: Hydrafera Blue 5. Secondary Dressing Applied Shan, [name] ([id number]) ABD Pad Kerlix/Conform 6. Footwear/Offloading device applied Surgical shoe 7. Secured with Tape Notes weave silvercell in between all toes, conform and coban with tape Electronic Signature(s) Signed: 08/12/2017 10:00:00 AM By: [name], BSN, RN, CWS, Kim RN, BSN Entered By: [name], BSN, RN, CWS, Kim on 08/12/2017 09:32:59 Wadle, [name] ([id number]) -------------------------------------------------------------------------------- Wound Assessment Details Patient Name: Paolucci, [name] Date of  Service: 08/12/2017 9:15 AM Medical Record Number: [id number] Patient Account Number: [id number] Date of Birth/Sex: 12-29-1950 (67 y.o. M) Treating RN: [name] Primary Care Weslee Prestage: [name] Other Clinician: Referring Willmar Stockinger: [name] Treating Sema Stangler/Extender: [name], [name] in Treatment: 17 Wound Status Wound Number: 6 Primary Diabetic Wound/Ulcer of the Lower Etiology: Extremity Wound Location: Left Toe Second Wound Status: Open Wounding Event: Gradually Appeared Date Acquired: 06/11/2016 [name] Of Treatment: 46 Clustered Wound: No Pending Amputation On Presentation Photos Photo Uploaded By: [name], BSN, RN, CWS, Kim on 08/12/2017 11:37:44 Wound Measurements Length: (cm) 0.3 Width: (cm) 0.3 Depth: (cm) 0.1 Area: (cm) 0.071 Volume: (cm) 0.007 % Reduction in Area: 96.7% % Reduction in Volume: 96.7% Wound Description Classification: Grade 2 Periwound  Skin Texture Texture Color No Abnormalities Noted: No No Abnormalities Noted: No Moisture No Abnormalities Noted: No Treatment Notes Wound #6 (Left Toe Second) 1. Cleansed with: Clean wound with Normal Saline 4. Dressing Applied: Hydrafera Blue 5. Secondary Dressing Applied Eckmann, [name] ([id number]) ABD Pad Kerlix/Conform 6. Footwear/Offloading device applied Surgical shoe 7. Secured with Tape Notes weave silvercell in between all toes, conform and coban with tape Electronic Signature(s) Signed: 08/12/2017 10:00:00 AM By: [name], BSN, RN, CWS, Kim RN, BSN Entered By: [name], BSN, RN, CWS, Kim on 08/12/2017 09:33:00

## 2017-08-13 NOTE — Progress Notes (Signed)
[name], SHOWERS ([id number]) Visit Report for 08/08/2017 Arrival Information Details Patient Name: [name], [name] Date of Service: 08/08/2017 11:15 AM Medical Record Number: [id number] Patient Account Number: [id number] Date of Birth/Sex: [birth year] (67 y.o. M) Treating RN: Montey Hora Primary Care Srihitha Tagliaferri: Lamonte Sakai Other Clinician: Referring Freddie Dymek: Lamonte Sakai Treating Alorah Mcree/Extender: Cathie Olden in Treatment: 50 Visit Information History Since Last Visit Added or deleted any medications: No Patient Arrived: Ambulatory Any new allergies or adverse reactions: No Arrival Time: 11:35 Had a fall or experienced change in No Accompanied By: brother activities of daily living that may affect Transfer Assistance: None risk of falls: Patient Identification Verified: Yes Signs or symptoms of abuse/neglect since last visito No Secondary Verification Process Completed: Yes Hospitalized since last visit: No Patient Requires Transmission-Based No Implantable device outside of the clinic excluding No Precautions: cellular tissue based products placed in the center Patient Has Alerts: Yes since last visit: Patient Alerts: DMII Has Dressing in Place as Prescribed: Yes Has Compression in Place as Prescribed: No Pain Present Now: No Electronic Signature(s) Signed: 08/08/2017 1:59:05 PM By: Montey Hora Entered By: Montey Hora on 08/08/2017 11:39:30 Ambrocio, [name] ([id number]) -------------------------------------------------------------------------------- Encounter Discharge Information Details Patient Name: [name], [name] Date of Service: 08/08/2017 11:15 AM Medical Record Number: [id number] Patient Account Number: [id number] Date of Birth/Sex: 26-Mar-1951 (67 y.o. M) Treating RN: Ahmed Prima Primary Care Shunte Senseney: Lamonte Sakai Other Clinician: Referring Taneil Lazarus: Lamonte Sakai Treating Ruperto Kiernan/Extender: Cathie Olden in Treatment: 23 Encounter Discharge  Information Items Discharge Pain Level: 0 Discharge Condition: Stable Ambulatory Status: Ambulatory Discharge Destination: Home Transportation: Private Auto Schedule Follow-up Appointment: No Medication Reconciliation completed and No provided to Patient/Care Shital Crayton: Provided on Clinical Summary of Care: 08/08/2017 Form Type Recipient Paper Patient ED Electronic Signature(s) Signed: 08/08/2017 12:42:09 PM By: Roger Shelter Entered By: Roger Shelter on 08/08/2017 12:16:41 Holloran, [name] ([id number]) -------------------------------------------------------------------------------- Lower Extremity Assessment Details Patient Name: [name], [name] Date of Service: 08/08/2017 11:15 AM Medical Record Number: [id number] Patient Account Number: [id number] Date of Birth/Sex: September 13, 1950 (67 y.o. M) Treating RN: Montey Hora Primary Care Catalyna Reilly: Lamonte Sakai Other Clinician: Referring Keedan Sample: Lamonte Sakai Treating Jaedyn Marrufo/Extender: Cathie Olden in Treatment: 45 Edema Assessment Assessed: [Left: No] [Right: No] [Left: Edema] [Right: :] Calf Left: Right: Point of Measurement: 34 cm From Medial Instep 34.6 cm 34.8 cm Ankle Left: Right: Point of Measurement: 12 cm From Medial Instep 24.8 cm 24.6 cm Vascular Assessment Pulses: Dorsalis Pedis Palpable: [Left:Yes] [Right:Yes] Posterior Tibial Extremity colors, hair growth, and conditions: Extremity Color: [Left:Hyperpigmented] [Right:Hyperpigmented] Hair Growth on Extremity: [Left:No] [Right:No] Temperature of Extremity: [Left:Warm] [Right:Warm] Capillary Refill: [Left:< 3 seconds] [Right:< 3 seconds] Toe Nail Assessment Left: Right: Thick: Yes Yes Discolored: Yes Yes Deformed: Yes Yes Improper Length and Hygiene: No No Electronic Signature(s) Signed: 08/08/2017 1:59:05 PM By: Montey Hora Entered By: Montey Hora on 08/08/2017 11:43:42 Baik, Santez E.  ([id number]) -------------------------------------------------------------------------------- Multi Wound Chart Details Patient Name: [name], [name] Date of Service: 08/08/2017 11:15 AM Medical Record Number: [id number] Patient Account Number: [id number] Date of Birth/Sex: 28-Sep-1950 (67 y.o. M) Treating RN: Ahmed Prima Primary Care Anastacia Reinecke: Lamonte Sakai Other Clinician: Referring Eion Timbrook: Lamonte Sakai Treating Susanna Benge/Extender: Cathie Olden in Treatment: 45 Vital Signs Height(in): 69 Pulse(bpm): 60 Weight(lbs): 168 Blood Pressure(mmHg): 153/72 Body Mass Index(BMI): 25 Temperature(F): 98.3 Respiratory Rate 16 (breaths/min): Photos: Wound Location: Right Toe Great Right Toe Second Left Toe Great Wounding Event: Gradually Appeared Gradually Appeared Gradually Appeared Primary Etiology: Diabetic Wound/Ulcer of the Diabetic Wound/Ulcer  of the Diabetic Wound/Ulcer of the Lower Extremity Lower Extremity Lower Extremity Comorbid History: Anemia, Lymphedema, Anemia, Lymphedema, Anemia, Lymphedema, Congestive Heart Failure, Congestive Heart Failure, Congestive Heart Failure, Hypertension, Peripheral Hypertension, Peripheral Hypertension, Peripheral Venous Disease, Type II Venous Disease, Type II Venous Disease, Type II Diabetes, Neuropathy Diabetes, Neuropathy Diabetes, Neuropathy Date Acquired: 06/11/2016 06/11/2016 06/11/2016 Weeks of Treatment: 107 45 45 Wound Status: Open Open Open Pending Amputation on Yes Yes Yes Presentation: Measurements L x W x D 0.4x0.4x0.1 0.5x1.7x0.1 0.2x1x0.1 (cm) Area (cm) : 0.126 0.668 0.157 Volume (cm) : 0.013 0.067 0.016 % Reduction in Area: 99.70% 78.20% 98.90% % Reduction in Volume: 99.70% 78.20% 98.90% Classification: Grade 2 Grade 2 Grade 2 Exudate Amount: Large Large Large Exudate Type: Serosanguineous Serosanguineous Serosanguineous Exudate Color: red, brown red, brown red, brown Foul Odor After Cleansing: No No Yes Odor  Anticipated Due to N/A N/A No Product Use: Wound Margin: Flat and Intact Flat and Intact Flat and Intact Granulation Amount: Large (67-100%) Large (67-100%) Large (67-100%) Bossman, Keanan E. ([id number]) Granulation Quality: Red Red Red Necrotic Amount: Small (1-33%) Small (1-33%) Small (1-33%) Necrotic Tissue: Eschar, Adherent Boling Exposed Structures: Fat Layer (Subcutaneous Fat Layer (Subcutaneous Fat Layer (Subcutaneous Tissue) Exposed: Yes Tissue) Exposed: Yes Tissue) Exposed: Yes Fascia: No Fascia: No Fascia: No Tendon: No Tendon: No Tendon: No Muscle: No Muscle: No Muscle: No Joint: No Joint: No Joint: No Bone: No Bone: No Bone: No Epithelialization: Small (1-33%) Medium (34-66%) Small (1-33%) Debridement: Debridement - Excisional Debridement - Excisional N/A Pre-procedure 11:50 11:50 N/A Verification/Time Out Taken: Pain Control: Lidocaine 4% Topical Solution Lidocaine 4% Topical Solution N/A Tissue Debrided: Subcutaneous, Slough Subcutaneous, Slough N/A Level: Skin/Subcutaneous Tissue Skin/Subcutaneous Tissue N/A Debridement Area (sq cm): 0.16 0.85 N/A Instrument: Curette Curette N/A Bleeding: Minimum Minimum N/A Hemostasis Achieved: Pressure Pressure N/A Procedural Pain: 0 0 N/A Post Procedural Pain: 0 0 N/A Debridement Treatment Procedure was tolerated well Procedure was tolerated well N/A Response: Post Debridement 0.4x0.4x0.2 0.5x1.7x0.2 N/A Measurements L x W x D (cm) Post Debridement Volume: 0.025 0.134 N/A (cm) Periwound Skin Texture: Excoriation: Yes Scarring: Yes Excoriation: No Callus: Yes Excoriation: No Induration: No Induration: No Induration: No Callus: No Crepitus: No Callus: No Crepitus: No Rash: No Crepitus: No Rash: No Scarring: No Rash: No Scarring: No Periwound Skin Moisture: Maceration: Yes Maceration: Yes Maceration: Yes Dry/Scaly: No Dry/Scaly: No Dry/Scaly: No Periwound Skin  Color: Atrophie Blanche: No Atrophie Blanche: No Atrophie Blanche: No Cyanosis: No Cyanosis: No Cyanosis: No Ecchymosis: No Ecchymosis: No Ecchymosis: No Erythema: No Erythema: No Erythema: No Hemosiderin Staining: No Hemosiderin Staining: No Hemosiderin Staining: No Mottled: No Mottled: No Mottled: No Pallor: No Pallor: No Pallor: No Rubor: No Rubor: No Rubor: No Temperature: No Abnormality No Abnormality No Abnormality Tenderness on Palpation: No No No Wound Preparation: Ulcer Cleansing: Ulcer Cleansing: Ulcer Cleansing: Rinsed/Irrigated with Saline Rinsed/Irrigated with Saline Rinsed/Irrigated with Saline Topical Anesthetic Applied: Topical Anesthetic Applied: Topical Anesthetic Applied: Other: lidocaine 4% Other: lidocaine 4% Other: lidocaine 4% Procedures Performed: Debridement Debridement N/A Wound Number: 6 N/A N/A Photos: N/A N/A ERLAND, VIVAS ([id number]) Wound Location: Left Toe Second N/A N/A Wounding Event: Gradually Appeared N/A N/A Primary Etiology: Diabetic Wound/Ulcer of the N/A N/A Lower Extremity Comorbid History: Anemia, Lymphedema, N/A N/A Congestive Heart Failure, Hypertension, Peripheral Venous Disease, Type II Diabetes, Neuropathy Date Acquired: 06/11/2016 N/A N/A Weeks of Treatment: 45 N/A N/A Wound Status: Open N/A N/A Pending Amputation on Yes N/A N/A Presentation: Measurements L x  W x D 0.3x0.3x0.1 N/A N/A (cm) Area (cm) : 0.071 N/A N/A Volume (cm) : 0.007 N/A N/A % Reduction in Area: 96.70% N/A N/A % Reduction in Volume: 96.70% N/A N/A Classification: Grade 2 N/A N/A Exudate Amount: Large N/A N/A Exudate Type: Serosanguineous N/A N/A Exudate Color: red, brown N/A N/A Foul Odor After Cleansing: No N/A N/A Odor Anticipated Due to N/A N/A N/A Product Use: Wound Margin: Flat and Intact N/A N/A Granulation Amount: Large (67-100%) N/A N/A Granulation Quality: Red, Hyper-granulation N/A N/A Necrotic Amount: Small (1-33%)  N/A N/A Necrotic Tissue: Adherent Slough N/A N/A Exposed Structures: Fat Layer (Subcutaneous N/A N/A Tissue) Exposed: Yes Fascia: No Tendon: No Muscle: No Joint: No Bone: No Epithelialization: Small (1-33%) N/A N/A Debridement: N/A N/A N/A Pain Control: N/A N/A N/A Tissue Debrided: N/A N/A N/A Level: N/A N/A N/A Debridement Area (sq cm): N/A N/A N/A Instrument: N/A N/A N/A Bleeding: N/A N/A N/A Hemostasis Achieved: N/A N/A N/A Procedural Pain: N/A N/A N/A Post Procedural Pain: N/A N/A N/A N/A N/A N/A Heiden, Nickson E. ([id number]) Debridement Treatment Response: Post Debridement N/A N/A N/A Measurements L x W x D (cm) Post Debridement Volume: N/A N/A N/A (cm) Periwound Skin Texture: Excoriation: No N/A N/A Induration: No Callus: No Crepitus: No Rash: No Scarring: No Periwound Skin Moisture: Maceration: Yes N/A N/A Dry/Scaly: No Periwound Skin Color: Atrophie Blanche: No N/A N/A Cyanosis: No Ecchymosis: No Erythema: No Hemosiderin Staining: No Mottled: No Pallor: No Rubor: No Temperature: No Abnormality N/A N/A Tenderness on Palpation: No N/A N/A Wound Preparation: Ulcer Cleansing: N/A N/A Rinsed/Irrigated with Saline Topical Anesthetic Applied: Other: lidocaine 4% Procedures Performed: N/A N/A N/A Treatment Notes Wound #1 (Right Toe Great) 1. Cleansed with: Clean wound with Normal Saline 2. Anesthetic Topical Lidocaine 4% cream to wound bed prior to debridement 4. Dressing Applied: Hydrafera Blue 5. Secondary Dressing Applied Dry Gauze Kerlix/Conform Notes hydra fera oin wounds , weave silvercell in between all toes, cover with dry gauze, conform and coban with tape Wound #2 (Right Toe Second) 1. Cleansed with: Clean wound with Normal Saline 2. Anesthetic Topical Lidocaine 4% cream to wound bed prior to debridement 4. Dressing Applied: Hydrafera Blue 5. Secondary Dressing Applied Dry Gauze Vinluan, Careem E.  ([id number]) Kerlix/Conform Notes hydra fera oin wounds , weave silvercell in between all toes, cover with dry gauze, conform and coban with tape Wound #5 (Left Toe Great) 1. Cleansed with: Clean wound with Normal Saline 2. Anesthetic Topical Lidocaine 4% cream to wound bed prior to debridement 4. Dressing Applied: Hydrafera Blue 5. Secondary Dressing Applied Dry Gauze Kerlix/Conform Notes hydra fera oin wounds , weave silvercell in between all toes, cover with dry gauze, conform and coban with tape Wound #6 (Left Toe Second) 1. Cleansed with: Clean wound with Normal Saline 2. Anesthetic Topical Lidocaine 4% cream to wound bed prior to debridement 4. Dressing Applied: Hydrafera Blue 5. Secondary Dressing Applied Dry Gauze Kerlix/Conform Notes hydra fera oin wounds , weave silvercell in between all toes, cover with dry gauze, conform and coban with tape Electronic Signature(s) Signed: 08/08/2017 12:34:29 PM By: Lawanda Cousins Entered By: Lawanda Cousins on 08/08/2017 12:34:28 Kowalke, [name] ([id number]) -------------------------------------------------------------------------------- [place] Details Patient Name: Pottenger, Trashawn E. Date of Service: 08/08/2017 11:15 AM Medical Record Number: [id number] Patient Account Number: [id number] Date of Birth/Sex: [birth year] (67 y.o. M) Treating RN: Ahmed Prima Primary Care Paisley Grajeda: Lamonte Sakai Other Clinician: Referring Bethanne Mule: Lamonte Sakai Treating Kahlen Morais/Extender: Cathie Olden in Treatment: 63 Active Inactive `  Abuse / Safety / Falls / Self Care Management Nursing Diagnoses: Potential for falls Goals: Patient will remain injury free related to falls Date Initiated: 09/21/2016 Target Resolution Date: 09/14/2017 Goal Status: Active Interventions: Assess fall risk on admission and as needed Notes: ` Nutrition Nursing Diagnoses: Potential for alteratiion in Nutrition/Potential for imbalanced  nutrition Goals: Patient/caregiver agrees to and verbalizes understanding of need to use nutritional supplements and/or vitamins as prescribed Date Initiated: 09/21/2016 Target Resolution Date: 09/14/2017 Goal Status: Active Interventions: Assess patient nutrition upon admission and as needed per policy Notes: ` Orientation to the Wound Care Program Nursing Diagnoses: Knowledge deficit related to the wound healing center program Goals: Patient/caregiver will verbalize understanding of the [place] Program Date Initiated: 09/21/2016 Target Resolution Date: 06/15/2017 Goal Status: Active Interventions: More, TRAJAN GROVE ([id number]) Provide education on orientation to the wound center Notes: ` Wound/Skin Impairment Nursing Diagnoses: Knowledge deficit related to smoking impact on wound healing Goals: Ulcer/skin breakdown will have a volume reduction of 30% by week 4 Date Initiated: 09/21/2016 Target Resolution Date: 08/17/2017 Goal Status: Active Ulcer/skin breakdown will have a volume reduction of 50% by week 8 Date Initiated: 09/21/2016 Target Resolution Date: 08/17/2017 Goal Status: Active Ulcer/skin breakdown will have a volume reduction of 80% by week 12 Date Initiated: 09/21/2016 Target Resolution Date: 09/14/2017 Goal Status: Active Ulcer/skin breakdown will heal within 14 weeks Date Initiated: 09/21/2016 Target Resolution Date: 08/17/2017 Goal Status: Active Interventions: Assess patient/caregiver ability to obtain necessary supplies Assess patient/caregiver ability to perform ulcer/skin care regimen upon admission and as needed Assess ulceration(s) every visit Notes: Electronic Signature(s) Signed: 08/09/2017 4:40:49 PM By: Alric Quan Entered By: Alric Quan on 08/08/2017 11:49:46 Buckalew, Tuck E. ([id number]) -------------------------------------------------------------------------------- Pain Assessment Details Patient Name: Killam, Jad E. Date of  Service: 08/08/2017 11:15 AM Medical Record Number: [id number] Patient Account Number: [id number] Date of Birth/Sex: 1951-03-31 (67 y.o. M) Treating RN: Montey Hora Primary Care Tansy Lorek: Lamonte Sakai Other Clinician: Referring Inice Sanluis: Lamonte Sakai Treating Betzabe Bevans/Extender: Cathie Olden in Treatment: 45 Active Problems Location of Pain Severity and Description of Pain Patient Has Paino No Site Locations Pain Management and Medication Current Pain Management: Electronic Signature(s) Signed: 08/08/2017 1:59:05 PM By: Montey Hora Entered By: Montey Hora on 08/08/2017 11:39:38 Fentress, [name] ([id number]) -------------------------------------------------------------------------------- Patient/Caregiver Education Details Patient Name: Mccurley, Yashar E. Date of Service: 08/08/2017 11:15 AM Medical Record Number: [id number] Patient Account Number: [id number] Date of Birth/Gender: 03-12-1951 (67 y.o. M) Treating RN: Roger Shelter Primary Care Physician: Lamonte Sakai Other Clinician: Referring Physician: Lamonte Sakai Treating Physician/Extender: Cathie Olden in Treatment: 59 Education Assessment Education Provided To: Patient Education Topics Provided Wound/Skin Impairment: Handouts: Caring for Your Ulcer Methods: Explain/Verbal Responses: State content correctly Electronic Signature(s) Signed: 08/08/2017 12:42:09 PM By: Roger Shelter Entered By: Roger Shelter on 08/08/2017 12:16:52 Velie, [name] ([id number]) -------------------------------------------------------------------------------- Wound Assessment Details Patient Name: Delvecchio, Jaasiel E. Date of Service: 08/08/2017 11:15 AM Medical Record Number: [id number] Patient Account Number: [id number] Date of Birth/Sex: Aug 10, 1950 (67 y.o. M) Treating RN: Montey Hora Primary Care Meril Dray: Lamonte Sakai Other Clinician: Referring Kalene Cutler: Lamonte Sakai Treating Thana Ramp/Extender: Cathie Olden in  Treatment: 42 Wound Status Wound Number: 1 Primary Diabetic Wound/Ulcer of the Lower Extremity Etiology: Wound Location: Right Toe Great Wound Open Wounding Event: Gradually Appeared Status: Date Acquired: 06/11/2016 Comorbid Anemia, Lymphedema, Congestive Heart Weeks Of Treatment: 45 History: Failure, Hypertension, Peripheral Venous Clustered Wound: No Disease, Type II Diabetes, Neuropathy Pending Amputation On Presentation Photos Photo Uploaded By: Montey Hora on 08/08/2017  12:02:33 Wound Measurements Length: (cm) 0.4 Width: (cm) 0.4 Depth: (cm) 0.1 Area: (cm) 0.126 Volume: (cm) 0.013 % Reduction in Area: 99.7% % Reduction in Volume: 99.7% Epithelialization: Small (1-33%) Tunneling: No Undermining: No Wound Description Classification: Grade 2 Wound Margin: Flat and Intact Exudate Amount: Large Exudate Type: Serosanguineous Exudate Color: red, brown Foul Odor After Cleansing: No Slough/Fibrino No Wound Bed Granulation Amount: Large (67-100%) Exposed Structure Granulation Quality: Red Fascia Exposed: No Necrotic Amount: Small (1-33%) Fat Layer (Subcutaneous Tissue) Exposed: Yes Necrotic Quality: Eschar, Adherent Slough Tendon Exposed: No Muscle Exposed: No Joint Exposed: No Bone Exposed: No Periwound Skin Texture Pendergraft, Bettie E. ([id number]) Texture Color No Abnormalities Noted: No No Abnormalities Noted: No Callus: Yes Atrophie Blanche: No Crepitus: No Cyanosis: No Excoriation: Yes Ecchymosis: No Induration: No Erythema: No Rash: No Hemosiderin Staining: No Scarring: No Mottled: No Pallor: No Moisture Rubor: No No Abnormalities Noted: No Dry / Scaly: No Temperature / Pain Maceration: Yes Temperature: No Abnormality Wound Preparation Ulcer Cleansing: Rinsed/Irrigated with Saline Topical Anesthetic Applied: Other: lidocaine 4%, Treatment Notes Wound #1 (Right Toe Great) 1. Cleansed with: Clean wound with Normal Saline 2.  Anesthetic Topical Lidocaine 4% cream to wound bed prior to debridement 4. Dressing Applied: Hydrafera Blue 5. Secondary Dressing Applied Dry Gauze Kerlix/Conform Notes hydra fera oin wounds , weave silvercell in between all toes, cover with dry gauze, conform and coban with tape Electronic Signature(s) Signed: 08/08/2017 1:59:05 PM By: Montey Hora Entered By: Montey Hora on 08/08/2017 11:46:43 Defelice, [name] ([id number]) -------------------------------------------------------------------------------- Wound Assessment Details Patient Name: Radloff, Jerick E. Date of Service: 08/08/2017 11:15 AM Medical Record Number: [id number] Patient Account Number: [id number] Date of Birth/Sex: 04/26/1950 (67 y.o. M) Treating RN: Montey Hora Primary Care Lyal Husted: Lamonte Sakai Other Clinician: Referring Manveer Gomes: Lamonte Sakai Treating Daurice Ovando/Extender: Cathie Olden in Treatment: 2 Wound Status Wound Number: 2 Primary Diabetic Wound/Ulcer of the Lower Extremity Etiology: Wound Location: Right Toe Second Wound Open Wounding Event: Gradually Appeared Status: Date Acquired: 06/11/2016 Comorbid Anemia, Lymphedema, Congestive Heart Weeks Of Treatment: 45 History: Failure, Hypertension, Peripheral Venous Clustered Wound: No Disease, Type II Diabetes, Neuropathy Pending Amputation On Presentation Photos Photo Uploaded By: Montey Hora on 08/08/2017 12:03:00 Wound Measurements Length: (cm) 0.5 Width: (cm) 1.7 Depth: (cm) 0.1 Area: (cm) 0.668 Volume: (cm) 0.067 % Reduction in Area: 78.2% % Reduction in Volume: 78.2% Epithelialization: Medium (34-66%) Tunneling: No Undermining: No Wound Description Classification: Grade 2 Wound Margin: Flat and Intact Exudate Amount: Large Exudate Type: Serosanguineous Exudate Color: red, brown Foul Odor After Cleansing: No Slough/Fibrino No Wound Bed Granulation Amount: Large (67-100%) Exposed Structure Granulation Quality:  Red Fascia Exposed: No Necrotic Amount: Small (1-33%) Fat Layer (Subcutaneous Tissue) Exposed: Yes Necrotic Quality: Adherent Slough Tendon Exposed: No Muscle Exposed: No Joint Exposed: No Bone Exposed: No Periwound Skin Texture Sangiovanni, Yeison E. ([id number]) Texture Color No Abnormalities Noted: No No Abnormalities Noted: No Callus: No Atrophie Blanche: No Crepitus: No Cyanosis: No Excoriation: No Ecchymosis: No Induration: No Erythema: No Rash: No Hemosiderin Staining: No Scarring: Yes Mottled: No Pallor: No Moisture Rubor: No No Abnormalities Noted: No Dry / Scaly: No Temperature / Pain Maceration: Yes Temperature: No Abnormality Wound Preparation Ulcer Cleansing: Rinsed/Irrigated with Saline Topical Anesthetic Applied: Other: lidocaine 4%, Treatment Notes Wound #2 (Right Toe Second) 1. Cleansed with: Clean wound with Normal Saline 2. Anesthetic Topical Lidocaine 4% cream to wound bed prior to debridement 4. Dressing Applied: Hydrafera Blue 5. Secondary Dressing Applied Dry Gauze Kerlix/Conform Notes hydra fera oin wounds ,  weave silvercell in between all toes, cover with dry gauze, conform and coban with tape Electronic Signature(s) Signed: 08/08/2017 1:59:05 PM By: Montey Hora Entered By: Montey Hora on 08/08/2017 11:47:10 Fleury, [name] ([id number]) -------------------------------------------------------------------------------- Wound Assessment Details Patient Name: Harroun, Uzair E. Date of Service: 08/08/2017 11:15 AM Medical Record Number: [id number] Patient Account Number: [id number] Date of Birth/Sex: January 12, 1951 (67 y.o. M) Treating RN: Montey Hora Primary Care Izaiha Lo: Lamonte Sakai Other Clinician: Referring Joseandres Mazer: Lamonte Sakai Treating Kampbell Holaway/Extender: Cathie Olden in Treatment: 4 Wound Status Wound Number: 5 Primary Diabetic Wound/Ulcer of the Lower Extremity Etiology: Wound Location: Left Toe Great Wound Open Wounding  Event: Gradually Appeared Status: Date Acquired: 06/11/2016 Comorbid Anemia, Lymphedema, Congestive Heart Weeks Of Treatment: 45 History: Failure, Hypertension, Peripheral Venous Clustered Wound: No Disease, Type II Diabetes, Neuropathy Pending Amputation On Presentation Photos Photo Uploaded By: Montey Hora on 08/08/2017 12:03:00 Wound Measurements Length: (cm) 0.2 Width: (cm) 1 Depth: (cm) 0.1 Area: (cm) 0.157 Volume: (cm) 0.016 % Reduction in Area: 98.9% % Reduction in Volume: 98.9% Epithelialization: Small (1-33%) Tunneling: No Undermining: No Wound Description Classification: Grade 2 Wound Margin: Flat and Intact Exudate Amount: Large Exudate Type: Serosanguineous Exudate Color: red, brown Foul Odor After Cleansing: Yes Due to Product Use: No Slough/Fibrino No Wound Bed Granulation Amount: Large (67-100%) Exposed Structure Granulation Quality: Red Fascia Exposed: No Necrotic Amount: Small (1-33%) Fat Layer (Subcutaneous Tissue) Exposed: Yes Necrotic Quality: Adherent Slough Tendon Exposed: No Muscle Exposed: No Joint Exposed: No Bone Exposed: No Periwound Skin Texture Bullis, Dereon E. ([id number]) Texture Color No Abnormalities Noted: No No Abnormalities Noted: No Callus: No Atrophie Blanche: No Crepitus: No Cyanosis: No Excoriation: No Ecchymosis: No Induration: No Erythema: No Rash: No Hemosiderin Staining: No Scarring: No Mottled: No Pallor: No Moisture Rubor: No No Abnormalities Noted: No Dry / Scaly: No Temperature / Pain Maceration: Yes Temperature: No Abnormality Wound Preparation Ulcer Cleansing: Rinsed/Irrigated with Saline Topical Anesthetic Applied: Other: lidocaine 4%, Treatment Notes Wound #5 (Left Toe Great) 1. Cleansed with: Clean wound with Normal Saline 2. Anesthetic Topical Lidocaine 4% cream to wound bed prior to debridement 4. Dressing Applied: Hydrafera Blue 5. Secondary Dressing Applied Dry  Gauze Kerlix/Conform Notes hydra fera oin wounds , weave silvercell in between all toes, cover with dry gauze, conform and coban with tape Electronic Signature(s) Signed: 08/08/2017 1:59:05 PM By: Montey Hora Entered By: Montey Hora on 08/08/2017 11:47:36 Focht, [name] ([id number]) -------------------------------------------------------------------------------- Wound Assessment Details Patient Name: Mabus, Edi E. Date of Service: 08/08/2017 11:15 AM Medical Record Number: [id number] Patient Account Number: [id number] Date of Birth/Sex: Sep 04, 1950 (67 y.o. M) Treating RN: Montey Hora Primary Care Zain Bingman: Lamonte Sakai Other Clinician: Referring Diamantina Edinger: Lamonte Sakai Treating Zackarie Chason/Extender: Cathie Olden in Treatment: 15 Wound Status Wound Number: 6 Primary Diabetic Wound/Ulcer of the Lower Extremity Etiology: Wound Location: Left Toe Second Wound Open Wounding Event: Gradually Appeared Status: Date Acquired: 06/11/2016 Comorbid Anemia, Lymphedema, Congestive Heart Weeks Of Treatment: 45 History: Failure, Hypertension, Peripheral Venous Clustered Wound: No Disease, Type II Diabetes, Neuropathy Pending Amputation On Presentation Photos Photo Uploaded By: Montey Hora on 08/08/2017 12:03:49 Wound Measurements Length: (cm) 0.3 Width: (cm) 0.3 Depth: (cm) 0.1 Area: (cm) 0.071 Volume: (cm) 0.007 % Reduction in Area: 96.7% % Reduction in Volume: 96.7% Epithelialization: Small (1-33%) Tunneling: No Undermining: No Wound Description Classification: Grade 2 Wound Margin: Flat and Intact Exudate Amount: Large Exudate Type: Serosanguineous Exudate Color: red, brown Foul Odor After Cleansing: No Slough/Fibrino No Wound Bed Granulation Amount: Large (67-100%) Exposed  Structure Granulation Quality: Red, Hyper-granulation Fascia Exposed: No Necrotic Amount: Small (1-33%) Fat Layer (Subcutaneous Tissue) Exposed: Yes Necrotic Quality: Adherent  Slough Tendon Exposed: No Muscle Exposed: No Joint Exposed: No Bone Exposed: No Periwound Skin Texture Hoot, Viaan E. ([id number]) Texture Color No Abnormalities Noted: No No Abnormalities Noted: No Callus: No Atrophie Blanche: No Crepitus: No Cyanosis: No Excoriation: No Ecchymosis: No Induration: No Erythema: No Rash: No Hemosiderin Staining: No Scarring: No Mottled: No Pallor: No Moisture Rubor: No No Abnormalities Noted: No Dry / Scaly: No Temperature / Pain Maceration: Yes Temperature: No Abnormality Wound Preparation Ulcer Cleansing: Rinsed/Irrigated with Saline Topical Anesthetic Applied: Other: lidocaine 4%, Treatment Notes Wound #6 (Left Toe Second) 1. Cleansed with: Clean wound with Normal Saline 2. Anesthetic Topical Lidocaine 4% cream to wound bed prior to debridement 4. Dressing Applied: Hydrafera Blue 5. Secondary Dressing Applied Dry Gauze Kerlix/Conform Notes hydra fera oin wounds , weave silvercell in between all toes, cover with dry gauze, conform and coban with tape Electronic Signature(s) Signed: 08/08/2017 1:59:05 PM By: Montey Hora Entered By: Montey Hora on 08/08/2017 11:48:01 Grell, [name] ([id number]) -------------------------------------------------------------------------------- [place] Details Patient Name: Passarella, Lazlo E. Date of Service: 08/08/2017 11:15 AM Medical Record Number: [id number] Patient Account Number: [id number] Date of Birth/Sex: Sep 23, 1950 (67 y.o. M) Treating RN: Montey Hora Primary Care Teshaun Olarte: Lamonte Sakai Other Clinician: Referring Neoma Uhrich: Lamonte Sakai Treating Hymie Gorr/Extender: Cathie Olden in Treatment: 45 Vital Signs Time Taken: 11:39 Temperature (F): 98.3 Height (in): 69 Pulse (bpm): 60 Weight (lbs): 168 Respiratory Rate (breaths/min): 16 Body Mass Index (BMI): 24.8 Blood Pressure (mmHg): 153/72 Reference Range: 80 - 120 mg / dl Electronic Signature(s) Signed: 08/08/2017 1:59:05  PM By: Montey Hora Entered By: Montey Hora on 08/08/2017 11:39:58

## 2017-08-13 NOTE — Progress Notes (Signed)
PERKINS, MOLINA ([id number]) Visit Report for 08/08/2017 Chief Complaint Document Details Patient Name: [name] [name] STARKS. Date of Service: 08/08/2017 11:15 AM Medical Record Number: [id number] Patient Account Number: [id number] Date of Birth/Sex: [birth year] (67 y.o. M) Treating RN: Ahmed Prima Primary Care Provider: Lamonte Sakai Other Clinician: Referring Provider: Lamonte Sakai Treating Provider/Extender: Cathie Olden in Treatment: 44 Information Obtained from: Patient Chief Complaint Patient presents for treatment of an open diabetic ulcer to both feet Electronic Signature(s) Signed: 08/08/2017 12:35:11 PM By: Lawanda Cousins Entered By: Lawanda Cousins on 08/08/2017 12:35:10 [name] [name] ([id number]) -------------------------------------------------------------------------------- Debridement Details Patient Name: [name] [name] E. Date of Service: 08/08/2017 11:15 AM Medical Record Number: [id number] Patient Account Number: [id number] Date of Birth/Sex: 14-Sep-1950 (67 y.o. M) Treating RN: Ahmed Prima Primary Care Provider: Lamonte Sakai Other Clinician: Referring Provider: Lamonte Sakai Treating Provider/Extender: Cathie Olden in Treatment: 45 Debridement Performed for Wound #1 Right Toe Great Assessment: Performed By: Physician Lawanda Cousins, NP Debridement Type: Debridement Severity of Tissue Pre Fat layer exposed Debridement: Pre-procedure Verification/Time Yes - 11:50 Out Taken: Start Time: 11:50 Pain Control: Lidocaine 4% Topical Solution Total Area Debrided (L x W): 0.4 (cm) x 0.4 (cm) = 0.16 (cm) Tissue and other material Viable, Non-Viable, Callus, Subcutaneous, Fibrin/Exudate debrided: Level: Skin/Subcutaneous Tissue Debridement Description: Excisional Instrument: Curette Bleeding: Minimum Hemostasis Achieved: Pressure End Time: 11:51 Procedural Pain: 0 Post Procedural Pain: 0 Response to Treatment: Procedure was tolerated well Post Debridement  Measurements of Total Wound Length: (cm) 0.4 Width: (cm) 0.4 Depth: (cm) 0.2 Volume: (cm) 0.025 Character of Wound/Ulcer Post Debridement: Requires Further Debridement Severity of Tissue Post Debridement: Fat layer exposed Post Procedure Diagnosis Same as Pre-procedure Electronic Signature(s) Signed: 08/08/2017 12:35:00 PM By: Lawanda Cousins Signed: 08/09/2017 4:40:49 PM By: Alric Quan Entered By: Lawanda Cousins on 08/08/2017 12:35:00 [name] [name] ([id number]) -------------------------------------------------------------------------------- HPI Details Patient Name: [name] [name] E. Date of Service: 08/08/2017 11:15 AM Medical Record Number: [id number] Patient Account Number: [id number] Date of Birth/Sex: 1951-03-29 (67 y.o. M) Treating RN: Ahmed Prima Primary Care Provider: Lamonte Sakai Other Clinician: Referring Provider: Lamonte Sakai Treating Provider/Extender: Cathie Olden in Treatment: 45 History of Present Illness Location: bilateral feet ulceration on the toes Quality: Patient reports experiencing a dull pain to affected area(s). Severity: Patient states wound are getting better Duration: Patient has had the wound for > 3 months prior to seeking treatment at the wound center Timing: Pain in wound is constant (hurts all the time) Context: The wound would happen gradually Modifying Factors: Other treatment(s) tried include:treatment for lymphedema and is seen by the podiatrist Dr. Caryl Comes Associated Signs and Symptoms: Patient reports having increase swelling. HPI Description: 67 year old patient here to see as for bilateral feet ulceration to on his left first and second toe and 2 on his right first and second toe, which she's had for about 4 months. 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 Donella Stade. The patient is also undergoing  workup by medical oncology for a intra-abdominal lymphadenopathy. 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 He is also status post appendectomy, inguinal lymph node biopsy, rectal biopsy and rectal examination under anesthesia. he currently smokes cigarettes about half packet a day. In March of this year he was seen by Dr. Hortencia Pilar, for evaluation  of bilateral varicose veins and besides wearing compression stockings he had recommended laser ablation of the right and left great saphenous veins to eleviate the symptoms and complications of severe superficial venous reflux disease. He also recommended lymphedema pumps for better control of his lymphedema. The patient recently has had on 08/23/2016, right greater saphenous vein ablation with the laser energy Earlier lower extremity venous reflux examination done on 05/08/2016 showed no DVT or SVT both lower legs but incompetence of bilateral great saphenous veins was present. A lower arterial study was also done and there was no significant right lower and left lower extremity problems based on a normal toe brachial index bilaterally and the ABI was 1.21 the left and 1.23 on the right. His post ablation venous duplex examination showed successful ablation of the right GS vein with thrombus formation 2 below the right saphenofemoral junction. The deep system was patent without evidence of thrombosis and this was done on 08/30/2016. 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 Addendum: regarding his x-rays done today and x-ray of the left foot -- IMPRESSION: No objective evidence of osteomyelitis. There are soft tissue changes which may reflect cellulitis. X-ray of the right foot -- IMPRESSION:Findings compatible with  cellulitis of the toes. No objective evidence of osteomyelitis is observed. 10/01/16 on evaluation today patient's wounds appeared to be doing some better. I did review the x-rays as well which showed no evidence of osteomyelitis although there was evidence on x-ray of cellulitis. He fortunately is not having any discomfort although he continues to have some swelling. He does not remember being on any antibiotics recently. 10/15/16 on evaluation today patient's wounds overall appear to be doing better although he does have a new location noted on the left foot. Fortunately he is not having significant pain. It almost has the appearance that something is rubbing on the end of his toes but he wears the open toe shoes and according to what he is telling me never wears anything that would rub on his foot. There is no evidence of infection and specifically no evidence of a fungal infection 10/22/16 On evaluation today patient's wounds appeared to be doing better compared to last week in regard to his bilateral [name] [name] E. ([id number]) lower extremities. Fortunately I happy with how things are progressing although he still has ulcers I feel like that he is improving and appropriate manner. 11/12/16 on evaluation today patient appears to be doing well in regard to his bilateral feet and the respective wounds. We have been using surrounding her dressings along with an antifungal cream which seems to be doing very well. He has no bilateral dysfunction noticed that the rituals are weight loss at this point. He also has no nausea or vomiting a note purulent discharge. He did see Vein and vascular today and he tells me that they told him he could have surgery for his venous stasis but they did not feel like it was worth it in his words. Fortunately patient's wounds do appear to be getting sneakily better. 11/26/2016 -- he says he is going to have some surgery during this week at [facility] for possibly a colon  resection. 12/31/2016 -- the patient has been noncompliant with his smoking and I'm not sure whether he is also started drinking again. He continues to be very nonchalant about his care 01/14/2017 -- the patient's HandP has been reviewed  well and I understand he is being compliant with trying to give up smoking and his local dressing changes. He does not have any surgical options of 4 to him by his vascular surgeons.he was last seen in early August by Dr. Hortencia Pilar who recommended compression stockings,and possibly lymph pumps in 2-3 months after doing a review ultrasound. 01/28/2017 - the patient did not have any fresh complaints but on examination I noted a large lacerated wound on the plantar aspect of his right fourth toe which had a lot of necrotic debris and it probes down to bone. 02/07/2017 -- x-ray of the right foot -- IMPRESSION: Soft tissue swelling about the first through fourth toes consistent with cellulitis. New destructive change in the tuft of the distal phalanx of the great toe is consistent with osteomyelitis. 02/14/2017 -- the patient's MRI is pending this coming Monday and he still continues to smoke. We have again gone over off loading of his wounds in great detail and he says he's been compliant. 02/21/2017 -- MR of the right foot -- IMPRESSION: 1. Soft tissue ulcer at the tip of the first, second and third toe knows. Cortical irregularity and bone marrow edema in the first distal phalanx most concerning for osteomyelitis. Mild marrow edema in the second and third distal phalanx without definite cortical destruction which may reflect early osteomyelitis versus reactive marrow edema. 2. Soft tissue edema surrounding the first phalanx most consistent with cellulitis. the patient was also recently evaluated by his medical oncologist Dr. 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 She is treating him  with weekly Procrit. She is also keeping intra-abdominal lymphadenopathy and right lower lobe lung nodule under observation. 04/04/2017 -- he was seen by Dr. Adrian Prows on 03/25/2017 -- after review he empirically put him on ciprofloxacin and doxycycline as they have good bone penetration and good bioavailability and it will cover the usual pathogens and diabetic foot osteomyelitis. He will check inflammatory markers and plan a 34-8 week old records. C-reactive protein was 0.3 and the ESR was 72 04/18/17 on evaluation today patient appears to be doing about the same in regard to his lower extremity wounds bilaterally. He has continued to use the antifungal cream which does seem to be beneficial. Nonetheless the ulcers do seem to in some areas be epithelial eyes over and in other areas are still open. He is having no significant discomfort. 04/25/17-he is here in follow-up evaluation for multiple ulcerations to multiple toes bilaterally. He states he did see Dr. Ola Spurr again last week and continues antibiotic therapy. He is voicing no complaints or concerns, will continue with current treatment plan will possibility of adding compression therapy next week after an additional week of treatment/lotions to BLE prescribed by Dr Ola Spurr 05/02/17 he is here in follow up for for multiple ulcers to multiple toes bilaterally. we will stop using antifungal cream and will continue with silvercel and follow up next week 05/09/17-he is here in follow-up for multiple ulcerations to multiple toes bilaterally. There is improvement in appearance. He has not completely stopped using antifungal cream, but admits he has not using it between the toes. He has an appointment with Dr. Ola Spurr on 2/11, continues on doxycycline and Cipro. It has been 5 weeks of antibiotic therapy, we will order plain film xray to evaluate for osteomyelitis next week, prior to follow up with ID. Will continue with silvercel and  follow up next week 05/16/17-he  is here in follow-up evaluation for multiple ulcerations to multiple toes bilaterally and new wound to the right posterior heel. There is essentially no change in appearance, deteriorating measurements; he has a history of waxing and waning measurements. He admits that he continues to apply moisturizer/cream/ointment to his toes despite weekly reminders to only apply silvercel to his toes. He states that he thinks the surgical shoe contributed to the superficial ulcer to his posterior Poinsett, [name] E. ([id number]) heel, he is unable to articulate if this was an area of dry cracked skin as he has a similar area to the left heel. He now is wearing open toed slippers. He has an appointment with Dr. Ola Spurr on 2/11. We have ordered x-rays for her bilateral feet; he was advised to obtain the x-rays today or tomorrow. He will follow-up next week 05/23/17-he is here in follow-up evaluation for multiple ulcerations to multiple toes bilaterally and the right posterior heel. There is improvement in maceration. He has been compliant and not applying any moisturizing agent to his toes. He has been using Lac-Hydrin for his lower extremities with improvement. He did not go to his appointment on Monday with Dr. Ola Spurr secondary to financial concerns. X-rays for her bilateral feet showed: LEFT FOOT with slight erosion of the tuft of the distal phalanges of the left first and second toe suspicious for osteomyelitis, RIGHT FOOT with 1.erosion of the tufts of the distal phalanges of the right first second and possibly third toes consistent with osteomyelitis, 2 no definitive abnormality of the calcaneus is seen on the images obtained, 3. Plantar calcaneal degenerative spur. We briefly discussed hyperbaric adjunctive therapy for treatment of chronic refractory osteomyelitis. I do not find an a1c in EMR, will contact PCP for record, or order if needed. He has been encouraged to contact  Dr Ola Spurr office regarding the follow-up appointment, encouraged him to inquire about payment plan. We will continue with same treatment plan and follow-up next week. He states he is still taking antibiotics and has "a lot" left. He states he has been taking them as directed, 2 pills twice daily. According to Dr. Blane Ohara office notes he was originally started on 12/17 for 4 weeks and extended on 1/14 for an additional 4 weeks. He should be done with his antibiotic therapy, he was advised to bring his bottles and to his next appointment, we will contact pharmacy. 05/30/17-he is here in follow-up evaluation for multiple ulcerations to multiple toes bilaterally and the right posterior heel. He is accompanied by his brother-in-law. Wounds are stable. He has yet to make up with Dr. Ola Spurr. We contacted his PCP, with no record of recent A1c we will draw an A1c. His brother-in-law states that he was taken off all of his diabetic medication secondary to kidney function. 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 The antibiotics were counted and have approximately 2 weeks left, although they should be complete. The pill organizer reveals missing days. We discussed the need for consistent medications, to have optimal benefit of medication. He has a cousin that lives with him and he will ask her to check his organizer daily. He has been advised to follow up with Dr Ola Spurr, and will go by the office today. He has been advised to quit smoking. 06/06/17-he is here in follow up evaluation. He has had to make an appointment with Dr. Ola Spurr. He did have blood work obtained, a1c 5. He  continues to take antibiotic therapy. Significant improvement in bilateral lower extremity edema with compression therapy. Essentially no change in ulcerations to toes. He states he is "going to try something different"  and "let me know next week" if it works; he would not provide any additional information and was encouraged to follow our orders. We will follow up next week 06/13/17-he is here in follow-up evaluation. He has an appointment with Dr. Ola Spurr tomorrow morning. He states he purchased an ointment from [facility] and applied to his toes for 3 days, he does not remember the name of the ointment. There is improvement to his wounds, minimal maceration. He continues to take antibiotic therapy, this should have been completed last month. His brother-in-law who regularly accompanies his appointments was asked to take the bottles to the appointment tomorrow with Dr. Ola Spurr so he is aware. We will continue with 3 layer compression, and order OPEN TOE compression 20-30mmHg; we will apply compression stockings next week. He continues to smoke, smoked "2 cigarettes" last week 06/20/17 on evaluation today patient did receive his compression stockings which he has with him today for both lower extremities. With that being said he tells me at this point in time that he is very happy to have these he really is not a big fan of the compression wraps that we have been utilizing although they have been of great benefit for him. Nonetheless at this point he does want to switch to the compression stockings. In my opinion as long as he is continuing with compression I'm okay with the stockings or the wraps. 06/27/17-he is here in follow-up evaluation for multiple ulcerations to his bilateral toes. There is some improvement in appearance. He is compliant in wearing his compression stockings with significant improvement in lower extremity edema. He saw Dr Ola Spurr on 3/8, per his notes they would redraw ESR and CRP; plan to continue antibiotic therapy if these remain elevated. I do not see an ESR or CRP level in Epic. The patient continues to take antibiotics. 07/11/17-He is here in follow-up evaluation for multiple  ulcerations to multiple toes bilaterally. He presents with complete epithelialization to the right third toe; there has been no deterioration. He continues on antibiotic therapy. He will follow-up next week 07/18/17-He is here in follow-up evaluation for multiple ulcerations to multiple toes bilaterally. He continues to make improvement. He continues on antibiotic therapy. He states he has been using something additional to our orders, he does not elaborate but states he will bring it in next week. 07/25/17-He is here in follow up evaluation for multiple ulcerations to bilateral toes. He is stable. He has completed antibiotic therapy. He admits to "filing" his toes after showers each evening, this is what he was referencing last week; he does not filing for the wounds. We will switch to [facility] and monitor for any improvement, he will follow-up next week 08/01/17-He is here in follow-up evaluation. He admits to "picking" at his toes after cleansing yesterday, leading to new areas of tissue loss on the bilateral second toe. There is improvement noted to the bilateral great toe. We will dress toes today and hope that they maintain until Monday where he will come in for a nurse visit. He has been advised, multiple times with expressed verbalization, to change the dressings to silvercel if the dressings get wet prior to [facility] appointment. BIRD, SWETZ ([id number]) 08/08/17-He is here in follow-up evaluation for bilateral first and second toe ulcerations. There is significant improvement to all  ulcerations since last visit. We will switch to [facility] to all wounds and he will continue with nurse visits on a Monday/Thursday schedule and follow-up with me in 2 weeks. He continues to smoke, 1-3 cigarettes per day, and has been encouraged to not smoke until his next follow-up in 2 weeks. Electronic Signature(s) Signed: 08/08/2017 12:36:41 PM By: Lawanda Cousins Entered By: Lawanda Cousins on  08/08/2017 12:36:41 Alers, [name] [name] ([id number]) -------------------------------------------------------------------------------- Physician Orders Details Patient Name: Kilgore, Garet E. Date of Service: 08/08/2017 11:15 AM Medical Record Number: [id number] Patient Account Number: [id number] Date of Birth/Sex: 1951-02-03 (67 y.o. M) Treating RN: Ahmed Prima Primary Care Provider: Lamonte Sakai Other Clinician: Referring Provider: Lamonte Sakai Treating Provider/Extender: Cathie Olden in Treatment: 86 Verbal / Phone Orders: Yes Clinician: Carolyne Fiscal, Debi Read Back and Verified: Yes Diagnosis Coding Wound Cleansing o Clean wound with Normal Saline. - all wounds o Cleanse wound with mild soap and water - all wounds o May Shower, gently pat wound dry prior to applying new dressing. - all wounds Anesthetic (add to Medication List) Wound #1 Right Toe Great o Topical Lidocaine 4% cream applied to wound bed prior to debridement (In Clinic Only). - all wounds Primary Wound Dressing o Silver Alginate - between all toes o Hydrafera Blue Ready Transfer - all wounds Secondary Dressing Wound #1 Right Toe Great o ABD pad o Kerlix and Coban Dressing Change Frequency Wound #1 Right Toe Great o Dressing is to be changed Monday and Thursday. - all wounds Follow-up Appointments Wound #1 Right Toe Great o Return Appointment in 1 week. o Nurse Visit as needed Edema Control o Patient to wear own compression stockings o Elevate legs to the level of the heart and pump ankles as often as possible Additional Orders / Instructions Wound #1 Right Toe Great o Stop Smoking o Increase protein intake. o Other: - Please add vitamin A, vitamin C and zinc supplements to your diet Patient Medications Allergies: No Known Drug Allergies Notifications Medication Indication Start End lidocaine DOSE 1 - topical 4 % cream - 1 cream topical [name] [name] E.  ([id number]) Electronic Signature(s) Signed: 08/08/2017 12:54:19 PM By: Lawanda Cousins Signed: 08/09/2017 4:40:49 PM By: Alric Quan Entered By: Alric Quan on 08/08/2017 11:58:25 Desir, [name] [name] ([id number]) -------------------------------------------------------------------------------- Prescription 08/08/2017 Patient Name: [name] Provider: Lawanda Cousins NP Date of Birth: 19-May-1950 NPI#: [id number] Sex: Jerilynn Mages DEA#: [id number] Phone #: [phone] License #: Patient Address: [facility] [place], [place] [address] [address], [place], [place] [address] [phone] Allergies No Known Drug Allergies Medication Medication: Route: Strength: Form: lidocaine topical 4% cream Class: TOPICAL LOCAL ANESTHETICS Dose: Frequency / Time: Indication: 1 1 cream topical Number of Refills: Number of Units: 0 Generic Substitution: Start Date: End Date: Administered at Substitution Permitted Facility: Yes Time Administered: Time Discontinued: Note to Pharmacy: Signature(s): Date(s): Electronic Signature(s) Signed: 08/08/2017 12:54:19 PM By: Lawanda Cousins Signed: 08/09/2017 4:40:49 PM By: Alric Quan Entered By: Alric Quan on 08/08/2017 11:58:26 [name] [name] E. ([id number]) [name] [name] E. ([id number]) --------------------------------------------------------------------------------  Problem List Details Patient Name: [name] [name] E. Date of Service: 08/08/2017 11:15 AM Medical Record Number: [id number] Patient Account Number: [id number] Date of Birth/Sex: 02-Apr-1951 (67 y.o. M) Treating RN: Ahmed Prima Primary Care Provider: Lamonte Sakai Other Clinician: Referring Provider: Lamonte Sakai Treating Provider/Extender: Cathie Olden in Treatment: 62 Active Problems ICD-10 Impacting Encounter Code Description Active Date Wound Healing Diagnosis E11.621 Type 2  diabetes mellitus with foot ulcer 09/21/2016 Yes I87.313 Chronic venous hypertension (idiopathic) with ulcer of 09/21/2016 Yes bilateral lower extremity I89.0 Lymphedema, not elsewhere classified 09/21/2016 Yes L97.522 Non-pressure chronic ulcer of other part of left foot with fat 09/21/2016 Yes layer exposed L97.512 Non-pressure chronic ulcer of other part of right foot with fat 09/21/2016 Yes layer exposed F17.218 Nicotine dependence, cigarettes, with other nicotine-induced 09/21/2016 Yes disorders F10.19 Alcohol abuse with unspecified alcohol-induced disorder 09/21/2016 Yes M86.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, right ankle and foot 02/21/2017 Yes Inactive Problems Resolved Problems Electronic Signature(s) CIRE, CLUTE ([id number]) Signed: 08/08/2017 12:34:21 PM By: Lawanda Cousins Entered By: Lawanda Cousins on 08/08/2017 12:34:20 Bentz, [name] [name] ([id number]) -------------------------------------------------------------------------------- Progress Note Details Patient Name: [name] [name] E. Date of Service: 08/08/2017 11:15 AM Medical Record Number: [id number] Patient Account Number: [id number] Date of Birth/Sex: 1950-10-11 (67 y.o. M) Treating RN: Ahmed Prima Primary Care Provider: Lamonte Sakai Other Clinician: Referring Provider: Lamonte Sakai Treating Provider/Extender: Cathie Olden in Treatment: 37 Subjective Chief Complaint Information obtained from Patient Patient presents for treatment of an open diabetic ulcer to both feet History of Present Illness (HPI) The following HPI elements were documented for the patient's wound: Location: bilateral feet ulceration on the toes Quality: Patient reports experiencing a dull pain to affected area(s). Severity: Patient states wound are getting better Duration: Patient has had the wound for > 3 months prior to seeking treatment at the wound center Timing: Pain in wound is constant (hurts all the time) Context: The wound would  happen gradually Modifying Factors: Other treatment(s) tried include:treatment for lymphedema and is seen by the podiatrist Dr. Caryl Comes Associated Signs and Symptoms: Patient reports having increase swelling. 67 year old patient here to see as for bilateral feet ulceration to on his left first and second toe and 2 on his right first and second toe, which she's had for about 4 months. 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 Donella Stade. The patient is also undergoing workup by medical oncology for a intra-abdominal lymphadenopathy. 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 He is also status post appendectomy, inguinal lymph node biopsy, rectal biopsy and rectal examination under anesthesia. he currently smokes cigarettes about half packet a day. In March of this year he was seen by Dr. Hortencia Pilar, for evaluation of bilateral varicose veins and besides wearing compression stockings he had recommended laser ablation of the right and left great saphenous veins to eleviate the symptoms and complications of severe superficial venous reflux disease. He also recommended lymphedema pumps for better control of his lymphedema. The patient recently has had on 08/23/2016, right greater saphenous vein ablation with the laser energy Earlier lower extremity venous reflux examination done on 05/08/2016 showed no DVT or SVT both lower legs but incompetence of bilateral great saphenous veins was present. A lower arterial study was also done and there was no significant right lower and left lower extremity problems based on a normal toe brachial index bilaterally and the ABI was 1.21 the left and 1.23 on the right. His post ablation venous duplex examination showed  successful ablation of the right GS vein with thrombus formation 2 below the right saphenofemoral junction. The deep system was patent without evidence of thrombosis and this was done on 08/30/2016. the patient also has a  squamous cell cancer of the skin of the buttock and is recently undergone radiation therapy for this prior to excisional surgery. Addendum: regarding his x-rays done today and x-ray of the left foot -- IMPRESSION: No objective evidence of osteomyelitis. There are soft tissue changes which may reflect cellulitis. X-ray of the right foot -- IMPRESSION:Findings compatible with cellulitis of the toes. No objective evidence of osteomyelitis is observed. 10/01/16 on evaluation today patient's wounds appeared to be doing some better. I did review the x-rays as well which showed no evidence of osteomyelitis although there was evidence on x-ray of cellulitis. He fortunately is not having any discomfort Muchow, Jaja E. ([id number]) although he continues to have some swelling. He does not remember being on any antibiotics recently. 10/15/16 on evaluation today patient's wounds overall appear to be doing better although he does have a new location noted on the left foot. Fortunately he is not having significant pain. It almost has the appearance that something is rubbing on the end of his toes but he wears the open toe shoes and according to what he is telling me never wears anything that would rub on his foot. There is no evidence of infection and specifically no evidence of a fungal infection 10/22/16 On evaluation today patient's wounds appeared to be doing better compared to last week in regard to his bilateral lower extremities. Fortunately I happy with how things are progressing although he still has ulcers I feel like that he is improving and appropriate manner. 11/12/16 on evaluation today patient appears to be doing well in regard to his bilateral feet and the respective wounds. We  have been using surrounding her dressings along with an antifungal cream which seems to be doing very well. He has no bilateral dysfunction noticed that the rituals are weight loss at this point. He also has no nausea or vomiting a note purulent discharge. He did see Vein and vascular today and he tells me that they told him he could have surgery for his venous stasis but they did not feel like it was worth it in his words. Fortunately patient's wounds do appear to be getting sneakily better. 11/26/2016 -- he says he is going to have some surgery during this week at [facility] for possibly a colon resection. 12/31/2016 -- the patient has been noncompliant with his smoking and I'm not sure whether he is also started drinking again. He continues to be very nonchalant about his care 01/14/2017 -- the patient's HandP has been reviewed well and I understand he is being compliant with trying to give up smoking and his local dressing changes. He does not have any surgical options of 4 to him by his vascular surgeons.he was last seen in early August by Dr. Hortencia Pilar who recommended compression stockings,and possibly lymph pumps in 2-3 months after doing a review ultrasound. 01/28/2017 - the patient did not have any fresh complaints but on examination I noted a large lacerated wound on the plantar aspect of his right fourth toe which had a lot of necrotic debris and it probes down to bone. 02/07/2017 -- x-ray of the right foot -- IMPRESSION: Soft tissue swelling about the first through fourth toes consistent with cellulitis. New destructive change in the tuft of the distal phalanx of the great toe is consistent with osteomyelitis. 02/14/2017 -- the patient's MRI is pending this coming Monday and he still continues to smoke. We have again gone over off loading of his wounds in great detail and he  says he's been compliant. 02/21/2017 -- MR of the right foot -- IMPRESSION: 1. Soft tissue ulcer at the  tip of the first, second and third toe knows. Cortical irregularity and bone marrow edema in the first distal phalanx most concerning for osteomyelitis. Mild marrow edema in the second and third distal phalanx without definite cortical destruction which may reflect early osteomyelitis versus reactive marrow edema. 2. Soft tissue edema surrounding the first phalanx most consistent with cellulitis. the patient was also recently evaluated by his medical oncologist Dr. 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 She is treating him with weekly Procrit. She is also keeping intra-abdominal lymphadenopathy and right lower lobe lung nodule under observation. 04/04/2017 -- he was seen by Dr. Adrian Prows on 03/25/2017 -- after review he empirically put him on ciprofloxacin and doxycycline as they have good bone penetration and good bioavailability and it will cover the usual pathogens and diabetic foot osteomyelitis. He will check inflammatory markers and plan a 54-46 week old records. C-reactive protein was 0.3 and the ESR was 72 04/18/17 on evaluation today patient appears to be doing about the same in regard to his lower extremity wounds bilaterally. He has continued to use the antifungal cream which does seem to be beneficial. Nonetheless the ulcers do seem to in some areas be epithelial eyes over and in other areas are still open. He is having no significant discomfort. 04/25/17-he is here in follow-up evaluation for multiple ulcerations to multiple toes bilaterally. He states he did see Dr. Ola Spurr again last week and continues antibiotic therapy. He is voicing no complaints or concerns, will continue with current treatment plan will possibility of adding compression therapy next week after an additional week of treatment/lotions to BLE prescribed by Dr Ola Spurr 05/02/17 he is here in follow up for for multiple ulcers to  multiple toes bilaterally. we will stop using antifungal cream and will continue with silvercel and follow up next week [name] [name] E. ([id number]) 05/09/17-he is here in follow-up for multiple ulcerations to multiple toes bilaterally. There is improvement in appearance. He has not completely stopped using antifungal cream, but admits he has not using it between the toes. He has an appointment with Dr. Ola Spurr on 2/11, continues on doxycycline and Cipro. It has been 5 weeks of antibiotic therapy, we will order plain film xray to evaluate for osteomyelitis next week, prior to follow up with ID. Will continue with silvercel and follow up next week 05/16/17-he is here in follow-up evaluation for multiple ulcerations to multiple toes bilaterally and new wound to the right posterior heel. There is essentially no change in appearance, deteriorating measurements; he has a history of waxing and waning measurements. He admits that he continues to apply moisturizer/cream/ointment to his toes despite weekly reminders to only apply silvercel to his toes. He states that he thinks the surgical shoe contributed to the superficial ulcer to his posterior heel, he is unable to articulate if this was an area of dry cracked skin as he has a similar area to the left heel. He now is wearing open toed slippers. He has an appointment with Dr. Ola Spurr on 2/11. We have ordered x-rays for her bilateral feet; he was advised to obtain the x-rays today or tomorrow. He will follow-up next week 05/23/17-he is here in follow-up evaluation for multiple ulcerations to multiple toes bilaterally and the right posterior heel. There is improvement in maceration. He has been compliant  and not applying any moisturizing agent to his toes. He has been using Lac-Hydrin for his lower extremities with improvement. He did not go to his appointment on Monday with Dr. Ola Spurr secondary to financial concerns. X-rays for her bilateral feet  showed: LEFT FOOT with slight erosion of the tuft of the distal phalanges of the left first and second toe suspicious for osteomyelitis, RIGHT FOOT with 1.erosion of the tufts of the distal phalanges of the right first second and possibly third toes consistent with osteomyelitis, 2 no definitive abnormality of the calcaneus is seen on the images obtained, 3. Plantar calcaneal degenerative spur. We briefly discussed hyperbaric adjunctive therapy for treatment of chronic refractory osteomyelitis. I do not find an a1c in EMR, will contact PCP for record, or order if needed. He has been encouraged to contact Dr Ola Spurr office regarding the follow-up appointment, encouraged him to inquire about payment plan. We will continue with same treatment plan and follow-up next week. He states he is still taking antibiotics and has "a lot" left. He states he has been taking them as directed, 2 pills twice daily. According to Dr. Blane Ohara office notes he was originally started on 12/17 for 4 weeks and extended on 1/14 for an additional 4 weeks. He should be done with his antibiotic therapy, he was advised to bring his bottles and to his next appointment, we will contact pharmacy. 05/30/17-he is here in follow-up evaluation for multiple ulcerations to multiple toes bilaterally and the right posterior heel. He is accompanied by his brother-in-law. Wounds are stable. He has yet to make up with Dr. Ola Spurr. We contacted his PCP, with no record of recent A1c we will draw an A1c. His brother-in-law states that he was taken off all of his diabetic medication secondary to kidney function. 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 The antibiotics were counted and have approximately 2 weeks left, although they should be complete. The pill organizer reveals missing days. We discussed the need for consistent medications,  to have optimal benefit of medication. He has a cousin that lives with him and he will ask her to check his organizer daily. He has been advised to follow up with Dr Ola Spurr, and will go by the office today. He has been advised to quit smoking. 06/06/17-he is here in follow up evaluation. He has had to make an appointment with Dr. Ola Spurr. He did have blood work obtained, a1c 5. He continues to take antibiotic therapy. Significant improvement in bilateral lower extremity edema with compression therapy. Essentially no change in ulcerations to toes. He states he is "going to try something different" and "let me know next week" if it works; he would not provide any additional information and was encouraged to follow our orders. We will follow up next week 06/13/17-he is here in follow-up evaluation. He has an appointment with Dr. Ola Spurr tomorrow morning. He states he purchased an ointment from [facility] and applied to his toes for 3 days, he does not remember the name of the ointment. There is improvement to his wounds, minimal maceration. He continues to take antibiotic therapy, this should have been completed last month. His brother-in-law who regularly accompanies his appointments was asked to take the bottles to the appointment tomorrow with Dr. Ola Spurr so he is aware. We will continue with 3 layer compression, and order OPEN TOE compression 20-30mmHg; we will apply compression stockings next week. He continues to smoke,  smoked "2 cigarettes" last week 06/20/17 on evaluation today patient did receive his compression stockings which he has with him today for both lower extremities. With that being said he tells me at this point in time that he is very happy to have these he really is not a big fan of the compression wraps that we have been utilizing although they have been of great benefit for him. Nonetheless at this point he does want to switch to the compression stockings. In my opinion  as long as he is continuing with compression I'm okay with the stockings or the wraps. 06/27/17-he is here in follow-up evaluation for multiple ulcerations to his bilateral toes. There is some improvement in appearance. He is compliant in wearing his compression stockings with significant improvement in lower extremity edema. He saw Dr Ola Spurr on 3/8, per his notes they would redraw ESR and CRP; plan to continue antibiotic therapy if these remain elevated. I do not see an ESR or CRP level in Epic. The patient continues to take antibiotics. 07/11/17-He is here in follow-up evaluation for multiple ulcerations to multiple toes bilaterally. He presents with complete epithelialization to the right third toe; there has been no deterioration. He continues on antibiotic therapy. He will follow-up next week 07/18/17-He is here in follow-up evaluation for multiple ulcerations to multiple toes bilaterally. He continues to make improvement. He continues on antibiotic therapy. He states he has been using something additional to our orders, he does [name] [name] E. ([id number]) not elaborate but states he will bring it in next week. 07/25/17-He is here in follow up evaluation for multiple ulcerations to bilateral toes. He is stable. He has completed antibiotic therapy. He admits to "filing" his toes after showers each evening, this is what he was referencing last week; he does not filing for the wounds. We will switch to [facility] and monitor for any improvement, he will follow-up next week 08/01/17-He is here in follow-up evaluation. He admits to "picking" at his toes after cleansing yesterday, leading to new areas of tissue loss on the bilateral second toe. There is improvement noted to the bilateral great toe. We will dress toes today and hope that they maintain until Monday where he will come in for a nurse visit. He has been advised, multiple times with expressed verbalization, to change the dressings to  silvercel if the dressings get wet prior to [facility] appointment. 08/08/17-He is here in follow-up evaluation for bilateral first and second toe ulcerations. There is significant improvement to all ulcerations since last visit. We will switch to [facility] to all wounds and he will continue with nurse visits on a Monday/Thursday schedule and follow-up with me in 2 weeks. He continues to smoke, 1-3 cigarettes per day, and has been encouraged to not smoke until his next follow-up in 2 weeks. Objective Constitutional Vitals Time Taken: 11:39 AM, Height: 69 in, Weight: 168 lbs, BMI: 24.8, Temperature: 98.3 F, Pulse: 60 bpm, Respiratory Rate: 16 breaths/min, Blood Pressure: 153/72 mmHg. Integumentary (Hair, Skin) Wound #1 status is Open. Original cause of wound was Gradually Appeared. The wound is located on the Right Toe Great. The wound measures 0.4cm length x 0.4cm width x 0.1cm depth; 0.126cm^2 area and 0.013cm^3 volume. There is Fat Layer (Subcutaneous Tissue) Exposed exposed. There is no tunneling or undermining noted. There is a large amount of serosanguineous drainage noted. The wound margin is flat and intact. There is large (67-100%) red granulation within the wound bed. There is a small (1-33%) amount  of necrotic tissue within the wound bed including Eschar and Adherent Slough. The periwound skin appearance exhibited: Callus, Excoriation, Maceration. The periwound skin appearance did not exhibit: Crepitus, Induration, Rash, Scarring, Dry/Scaly, Atrophie Blanche, Cyanosis, Ecchymosis, Hemosiderin Staining, Mottled, Pallor, Rubor, Erythema. Periwound temperature was noted as No Abnormality. Wound #2 status is Open. Original cause of wound was Gradually Appeared. The wound is located on the Right Toe Second. The wound measures 0.5cm length x 1.7cm width x 0.1cm depth; 0.668cm^2 area and 0.067cm^3 volume. There is Fat Layer (Subcutaneous Tissue) Exposed exposed. There is no tunneling or  undermining noted. There is a large amount of serosanguineous drainage noted. The wound margin is flat and intact. There is large (67-100%) red granulation within the wound bed. There is a small (1-33%) amount of necrotic tissue within the wound bed including Adherent Slough. The periwound skin appearance exhibited: Scarring, Maceration. The periwound skin appearance did not exhibit: Callus, Crepitus, Excoriation, Induration, Rash, Dry/Scaly, Atrophie Blanche, Cyanosis, Ecchymosis, Hemosiderin Staining, Mottled, Pallor, Rubor, Erythema. Periwound temperature was noted as No Abnormality. Wound #5 status is Open. Original cause of wound was Gradually Appeared. The wound is located on the Left Toe Great. The wound measures 0.2cm length x 1cm width x 0.1cm depth; 0.157cm^2 area and 0.016cm^3 volume. There is Fat Layer (Subcutaneous Tissue) Exposed exposed. There is no tunneling or undermining noted. There is a large amount of serosanguineous drainage noted. Foul odor after cleansing was noted. The wound margin is flat and intact. There is large (67-100%) red granulation within the wound bed. There is a small (1-33%) amount of necrotic tissue within the wound bed including Adherent Slough. The periwound skin appearance exhibited: Maceration. The periwound skin appearance did not exhibit: Callus, Crepitus, Excoriation, Induration, Rash, Scarring, Dry/Scaly, Atrophie Blanche, Cyanosis, Ecchymosis, Hemosiderin Staining, Mottled, Pallor, Rubor, Erythema. Periwound temperature was noted as No Abnormality. Wound #6 status is Open. Original cause of wound was Gradually Appeared. The wound is located on the Left Toe Second. The wound measures 0.3cm length x 0.3cm width x 0.1cm depth; 0.071cm^2 area and 0.007cm^3 volume. There is Fat Layer (Subcutaneous Tissue) Exposed exposed. There is no tunneling or undermining noted. There is a large amount of Flood, Lochlann E. ([id number]) serosanguineous drainage noted. The  wound margin is flat and intact. There is large (67-100%) red, hyper - granulation within the wound bed. There is a small (1-33%) amount of necrotic tissue within the wound bed including Adherent Slough. The periwound skin appearance exhibited: Maceration. The periwound skin appearance did not exhibit: Callus, Crepitus, Excoriation, Induration, Rash, Scarring, Dry/Scaly, Atrophie Blanche, Cyanosis, Ecchymosis, Hemosiderin Staining, Mottled, Pallor, Rubor, Erythema. Periwound temperature was noted as No Abnormality. Assessment Active Problems ICD-10 E11.621 - Type 2 diabetes mellitus with foot ulcer I87.313 - Chronic venous hypertension (idiopathic) with ulcer of bilateral lower extremity I89.0 - Lymphedema, not elsewhere classified L97.522 - Non-pressure chronic ulcer of other part of left foot with fat layer exposed L97.512 - Non-pressure chronic ulcer of other part of right foot with fat layer exposed F17.218 - Nicotine dependence, cigarettes, with other nicotine-induced disorders F10.19 - Alcohol abuse 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 - Chronic multifocal osteomyelitis, right ankle and foot Procedures Wound #1 Pre-procedure diagnosis of Wound #1 is a Diabetic Wound/Ulcer of the Lower Extremity located on the Right Toe Great .Severity of Tissue Pre Debridement is: Fat layer exposed. There was a Excisional Skin/Subcutaneous Tissue Debridement with a total area of 0.16 sq cm performed by Lawanda Cousins, NP. With the following instrument(s): Curette. to  remove Viable and Non-Viable tissue/material Material removed includes Callus, Subcutaneous Tissue, and Fibrin/Exudate after achieving pain control using Lidocaine 4% Topical Solution. No specimens were taken. A time out was conducted at 11:50, prior to the start of the procedure. A Minimum amount of bleeding was controlled with Pressure. The procedure was tolerated well with a pain level of 0 throughout and a pain level of 0  following the procedure. Post Debridement Measurements: 0.4cm length x 0.4cm width x 0.2cm depth; 0.025cm^3 volume. Character of Wound/Ulcer Post Debridement requires further debridement. Severity of Tissue Post Debridement is: Fat layer exposed. Post procedure Diagnosis Wound #1: Same as Pre-Procedure Plan Wound Cleansing: Clean wound with Normal Saline. - all wounds Cleanse wound with mild soap and water - all wounds May Shower, gently pat wound dry prior to applying new dressing. - all wounds Anesthetic (add to Medication List): Wound #1 Right Toe Great: Topical Lidocaine 4% cream applied to wound bed prior to debridement (In Clinic Only). - all wounds Primary Wound Dressing: Maya, Giovonni E. ([id number]) Silver Alginate - between all toes Hydrafera Blue Ready Transfer - all wounds Secondary Dressing: Wound #1 Right Toe Great: ABD pad Kerlix and Coban Dressing Change Frequency: Wound #1 Right Toe Great: Dressing is to be changed Monday and Thursday. - all wounds Follow-up Appointments: Wound #1 Right Toe Great: Return Appointment in 1 week. Nurse Visit as needed Edema Control: Patient to wear own compression stockings Elevate legs to the level of the heart and pump ankles as often as possible Additional Orders / Instructions: Wound #1 Right Toe Great: Stop Smoking Increase protein intake. Other: - Please add vitamin A, vitamin C and zinc supplements to your diet The following medication(s) was prescribed: lidocaine topical 4 % cream 1 1 cream topical was prescribed at facility Electronic Signature(s) Signed: 08/08/2017 12:36:56 PM By: Lawanda Cousins Entered By: Lawanda Cousins on 08/08/2017 12:36:56 Polson, [name] [name] ([id number]) -------------------------------------------------------------------------------- SuperBill Details Patient Name: Harn, Diallo E. Date of Service: 08/08/2017 Medical Record Number: [id number] Patient Account Number: [id number] Date of Birth/Sex: April 07, 1951  (67 y.o. 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 F17.218 Nicotine dependence, cigarettes, 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 abuse 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

## 2017-08-15 ENCOUNTER — Encounter: Payer: Medicare HMO | Admitting: Physician Assistant

## 2017-08-15 DIAGNOSIS — E11621 Type 2 diabetes mellitus with foot ulcer: Secondary | ICD-10-CM | POA: Diagnosis not present

## 2017-08-19 DIAGNOSIS — E11621 Type 2 diabetes mellitus with foot ulcer: Secondary | ICD-10-CM | POA: Diagnosis not present

## 2017-08-19 NOTE — Progress Notes (Signed)
ANTARIO, YASUDA ([id number]) Visit Report for 08/15/2017 Chief Complaint Document Details Patient Name: [name] Date of Service: 08/15/2017 10:45 AM Medical Record Number: [id number] Patient Account Number: [id number] Date of Birth/Sex: [birth year] (67 y.o. M) Treating RN: Ahmed Prima Primary Care Provider: Lamonte Sakai Other Clinician: Referring Provider: Lamonte Sakai Treating Provider/Extender: Melburn Hake, Devlin Brink Weeks in Treatment: 63 Information Obtained from: Patient Chief Complaint Patient presents for treatment of an open diabetic ulcer to both feet Electronic Signature(s) Signed: 08/16/2017 2:32:11 PM By: Worthy Keeler PA-C Entered By: Worthy Keeler on 08/15/2017 10:57:20 [name] ([id number]) -------------------------------------------------------------------------------- HPI Details Patient Name: [name], [name] Date of Service: 08/15/2017 10:45 AM Medical Record Number: [id number] Patient Account Number: [id number] Date of Birth/Sex: 1951-04-05 (67 y.o. M) Treating RN: Ahmed Prima Primary Care Provider: Lamonte Sakai Other Clinician: Referring Provider: Lamonte Sakai Treating Provider/Extender: Melburn Hake, Sunnie Odden Weeks in Treatment: 53 History of Present Illness Location: bilateral feet ulceration on the toes Quality: Patient reports experiencing a dull pain to affected area(s). Severity: Patient states wound are getting better Duration: Patient has had the wound for > 3 months prior to seeking treatment at the wound center Timing: Pain in wound is constant (hurts all the time) Context: The wound would happen gradually Modifying Factors: Other treatment(s) tried include:treatment for lymphedema and is seen by the podiatrist Dr. Caryl [name] Associated Signs and Symptoms: Patient reports having increase swelling. HPI Description: 67 year old patient here to see as for bilateral feet ulceration to on his left first and second toe and 2 on his right first and second toe,  which she's had for about 4 months. 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 Donella [name]. The patient is also undergoing workup by medical oncology for a intra-abdominal lymphadenopathy. 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 He is also status post appendectomy, inguinal lymph node biopsy, rectal biopsy and rectal examination under anesthesia. he currently smokes cigarettes about half packet a day. In March of this year he was seen by Dr. Hortencia [name], for evaluation of bilateral varicose veins and besides wearing compression stockings he had recommended laser ablation of the right and left great saphenous veins to eleviate the symptoms and complications of severe superficial venous reflux disease. He also recommended lymphedema pumps for better control of his lymphedema. The patient recently has had on 08/23/2016, right greater saphenous vein ablation with the laser energy Earlier lower extremity venous reflux examination done on 05/08/2016 showed no DVT or SVT both lower legs but incompetence of bilateral great saphenous veins was present. A lower arterial study was also done and there was no significant right lower and left lower extremity problems based on a normal toe brachial index bilaterally and the ABI was 1.21 the left and 1.23 on the right. His post ablation venous duplex examination showed successful ablation of the right GS vein with thrombus formation 2 below the right saphenofemoral junction. The deep system was patent without evidence of thrombosis and this was done on 08/30/2016. the patient also has a squamous cell cancer of the skin of the buttock and is recently undergone radiation therapy for this prior to excisional  surgery. Addendum: regarding his x-rays done today and x-ray of the left foot -- IMPRESSION: No objective evidence of osteomyelitis. There  are soft tissue changes which may reflect cellulitis. X-ray of the right foot -- IMPRESSION:Findings compatible with cellulitis of the toes. No objective evidence of osteomyelitis is observed. 10/01/16 on evaluation today patient's wounds appeared to be doing some better. I did review the x-rays as well which showed no evidence of osteomyelitis although there was evidence on x-ray of cellulitis. He fortunately is not having any discomfort although he continues to have some swelling. He does not remember being on any antibiotics recently. 10/15/16 on evaluation today patient's wounds overall appear to be doing better although he does have a new location noted on the left foot. Fortunately he is not having significant pain. It almost has the appearance that something is rubbing on the end of his toes but he wears the open toe shoes and according to what he is telling me never wears anything that would rub on his foot. There is no evidence of infection and specifically no evidence of a fungal infection 10/22/16 On evaluation today patient's wounds appeared to be doing better compared to last week in regard to his bilateral [name], [name] ([id number]) lower extremities. Fortunately I happy with how things are progressing although he still has ulcers I feel like that he is improving and appropriate manner. 11/12/16 on evaluation today patient appears to be doing well in regard to his bilateral feet and the respective wounds. We have been using surrounding her dressings along with an antifungal cream which seems to be doing very well. He has no bilateral dysfunction noticed that the rituals are weight loss at this point. He also has no nausea or vomiting a note purulent discharge. He did see Vein and vascular today and he tells me that they told him he could have surgery  for his venous stasis but they did not feel like it was worth it in his words. Fortunately patient's wounds do appear to be getting sneakily better. 11/26/2016 -- he says he is going to have some surgery during this week at [facility] for possibly a colon resection. 12/31/2016 -- the patient has been noncompliant with his smoking and I'm not sure whether he is also started drinking again. He continues to be very nonchalant about his care 01/14/2017 -- the patient's HandP has been reviewed well and I understand he is being compliant with trying to give up smoking and his local dressing changes. He does not have any surgical options of 4 to him by his vascular surgeons.he was last seen in early August by Dr. Hortencia [name] who recommended compression stockings,and possibly lymph pumps in 2-3 months after doing a review ultrasound. 01/28/2017 - the patient did not have any fresh complaints but on examination I noted a large lacerated wound on the plantar aspect of his right fourth toe which had a lot of necrotic debris and it probes down to bone. 02/07/2017 -- x-ray of the right foot -- IMPRESSION: Soft tissue swelling about the first through fourth toes consistent with cellulitis. New destructive change in the tuft of the distal phalanx of the great toe is consistent with osteomyelitis. 02/14/2017 -- the patient's MRI is pending this coming Monday and he still continues to smoke. We have again gone over off loading of his wounds in great detail and he says he's been compliant. 02/21/2017 -- MR of the right foot -- IMPRESSION: 1. Soft tissue ulcer at the tip of the first, second and third toe knows. Cortical irregularity and bone marrow edema in the first distal phalanx most concerning  for osteomyelitis. Mild marrow edema in the second and third distal phalanx without definite cortical destruction which may reflect early osteomyelitis versus reactive marrow edema. 2. Soft tissue edema surrounding  the first phalanx most consistent with cellulitis. the patient was also recently evaluated by his medical oncologist Dr. 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 She is treating him with weekly Procrit. She is also keeping intra-abdominal lymphadenopathy and right lower lobe lung nodule under observation. 04/04/2017 -- he was seen by Dr. Adrian Prows on 03/25/2017 -- after review he empirically put him on ciprofloxacin and doxycycline as they have good bone penetration and good bioavailability and it will cover the usual pathogens and diabetic foot osteomyelitis. He will check inflammatory markers and plan a 20-68 week old records. C-reactive protein was 0.3 and the ESR was 72 04/18/17 on evaluation today patient appears to be doing about the same in regard to his lower extremity wounds bilaterally. He has continued to use the antifungal cream which does seem to be beneficial. Nonetheless the ulcers do seem to in some areas be epithelial eyes over and in other areas are still open. He is having no significant discomfort. 04/25/17-he is here in follow-up evaluation for multiple ulcerations to multiple toes bilaterally. He states he did see Dr. Ola Spurr again last week and continues antibiotic therapy. He is voicing no complaints or concerns, will continue with current treatment plan will possibility of adding compression therapy next week after an additional week of treatment/lotions to BLE prescribed by Dr Ola Spurr 05/02/17 he is here in follow up for for multiple ulcers to multiple toes bilaterally. we will stop using antifungal cream and will continue with silvercel and follow up next week 05/09/17-he is here in follow-up for multiple ulcerations to multiple toes bilaterally. There is improvement in appearance. He has not completely stopped using antifungal cream, but admits he has not using it between the toes. He has an  appointment with Dr. Ola Spurr on 2/11, continues on doxycycline and Cipro. It has been 5 weeks of antibiotic therapy, we will order plain film xray to evaluate for osteomyelitis next week, prior to follow up with ID. Will continue with silvercel and follow up next week 05/16/17-he is here in follow-up evaluation for multiple ulcerations to multiple toes bilaterally and new wound to the right posterior heel. There is essentially no change in appearance, deteriorating measurements; he has a history of waxing and waning measurements. He admits that he continues to apply moisturizer/cream/ointment to his toes despite weekly reminders to only apply silvercel to his toes. He states that he thinks the surgical shoe contributed to the superficial ulcer to his posterior [name], [name] ([id number]) heel, he is unable to articulate if this was an area of dry cracked skin as he has a similar area to the left heel. He now is wearing open toed slippers. He has an appointment with Dr. Ola Spurr on 2/11. We have ordered x-rays for her bilateral feet; he was advised to obtain the x-rays today or tomorrow. He will follow-up next week 05/23/17-he is here in follow-up evaluation for multiple ulcerations to multiple toes bilaterally and the right posterior heel. There is improvement in maceration. He has been compliant and not applying any moisturizing agent to his toes. He has been using Lac-Hydrin for his lower extremities with improvement. He did not go to his appointment on Monday with Dr. Ola Spurr secondary to financial concerns. X-rays for her bilateral feet  showed: LEFT FOOT with slight erosion of the tuft of the distal phalanges of the left first and second toe suspicious for osteomyelitis, RIGHT FOOT with 1.erosion of the tufts of the distal phalanges of the right first second and possibly third toes consistent with osteomyelitis, 2 no definitive abnormality of the calcaneus is seen on the images obtained, 3.  Plantar calcaneal degenerative spur. We briefly discussed hyperbaric adjunctive therapy for treatment of chronic refractory osteomyelitis. I do not find an a1c in EMR, will contact PCP for record, or order if needed. He has been encouraged to contact Dr Ola Spurr office regarding the follow-up appointment, encouraged him to inquire about payment plan. We will continue with same treatment plan and follow-up next week. He states he is still taking antibiotics and has "a lot" left. He states he has been taking them as directed, 2 pills twice daily. According to Dr. Blane Ohara office notes he was originally started on 12/17 for 4 weeks and extended on 1/14 for an additional 4 weeks. He should be done with his antibiotic therapy, he was advised to bring his bottles and to his next appointment, we will contact pharmacy. 05/30/17-he is here in follow-up evaluation for multiple ulcerations to multiple toes bilaterally and the right posterior heel. He is accompanied by his brother-in-law. Wounds are stable. He has yet to make up with Dr. Ola Spurr. We contacted his PCP, with no record of recent A1c we will draw an A1c. His brother-in-law states that he was taken off all of his diabetic medication secondary to kidney function. 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 The antibiotics were counted and have approximately 2 weeks left, although they should be complete. The pill organizer reveals missing days. We discussed the need for consistent medications, to have optimal benefit of medication. He has a cousin that lives with him and he will ask her to check his organizer daily. He has been advised to follow up with Dr Ola Spurr, and will go by the office today. He has been advised to quit smoking. 06/06/17-he is here in follow up evaluation. He has had to make an appointment with Dr. Ola Spurr. He did have  blood work obtained, a1c 5. He continues to take antibiotic therapy. Significant improvement in bilateral lower extremity edema with compression therapy. Essentially no change in ulcerations to toes. He states he is "going to try something different" and "let me know next week" if it works; he would not provide any additional information and was encouraged to follow our orders. We will follow up next week 06/13/17-he is here in follow-up evaluation. He has an appointment with Dr. Ola Spurr tomorrow morning. He states he purchased an ointment from [facility] and applied to his toes for 3 days, he does not remember the name of the ointment. There is improvement to his wounds, minimal maceration. He continues to take antibiotic therapy, this should have been completed last month. His brother-in-law who regularly accompanies his appointments was asked to take the bottles to the appointment tomorrow with Dr. Ola Spurr so he is aware. We will continue with 3 layer compression, and order OPEN TOE compression 20-30mmHg; we will apply compression stockings next week. He continues to smoke, smoked "2 cigarettes" last week 06/20/17 on evaluation today patient did receive his compression stockings which he has with him today for both lower extremities. With that being said he tells me at this point in time that he is very  happy to have these he really is not a big fan of the compression wraps that we have been utilizing although they have been of great benefit for him. Nonetheless at this point he does want to switch to the compression stockings. In my opinion as long as he is continuing with compression I'm okay with the stockings or the wraps. 06/27/17-he is here in follow-up evaluation for multiple ulcerations to his bilateral toes. There is some improvement in appearance. He is compliant in wearing his compression stockings with significant improvement in lower extremity edema. He saw Dr Ola Spurr on 3/8,  per his notes they would redraw ESR and CRP; plan to continue antibiotic therapy if these remain elevated. I do not see an ESR or CRP level in Epic. The patient continues to take antibiotics. 07/11/17-He is here in follow-up evaluation for multiple ulcerations to multiple toes bilaterally. He presents with complete epithelialization to the right third toe; there has been no deterioration. He continues on antibiotic therapy. He will follow-up next week 07/18/17-He is here in follow-up evaluation for multiple ulcerations to multiple toes bilaterally. He continues to make improvement. He continues on antibiotic therapy. He states he has been using something additional to our orders, he does not elaborate but states he will bring it in next week. 07/25/17-He is here in follow up evaluation for multiple ulcerations to bilateral toes. He is stable. He has completed antibiotic therapy. He admits to "filing" his toes after showers each evening, this is what he was referencing last week; he does not filing for the wounds. We will switch to [facility] and monitor for any improvement, he will follow-up next week 08/01/17-He is here in follow-up evaluation. He admits to "picking" at his toes after cleansing yesterday, leading to new areas of tissue loss on the bilateral second toe. There is improvement noted to the bilateral great toe. We will dress toes today and hope that they maintain until Monday where he will come in for a nurse visit. He has been advised, multiple times with expressed verbalization, to change the dressings to silvercel if the dressings get wet prior to [facility] appointment. JERRIN, RECORE ([id number]) 08/08/17-He is here in follow-up evaluation for bilateral first and second toe ulcerations. There is significant improvement to all ulcerations since last visit. We will switch to [facility] to all wounds and he will continue with nurse visits on a Monday/Thursday schedule and follow-up  with me in 2 weeks. He continues to smoke, 1-3 cigarettes per day, and has been encouraged to not smoke until his next follow-up in 2 weeks. 08/15/17 on evaluation today patient's ulcers on his toes actually appear to be doing fairly well the right to ulcers may be a little bit more moist compared to the left I'm not really sure exactly why as the openings appear to be very small and I'm not seeing any evidence of anything significant as far as you lightest or otherwise. Nonetheless this may just be a small setback he's been doing very well with the [facility] Dressing Electronic Signature(s) Signed: 08/16/2017 2:32:11 PM By: Worthy Keeler PA-C Entered By: Worthy Keeler on 08/15/2017 17:13:53 Vesely, [name] ([id number]) -------------------------------------------------------------------------------- Physical Exam Details Patient Name: [name], Paarth E. Date of Service: 08/15/2017 10:45 AM Medical Record Number: [id number] Patient Account Number: [id number] Date of Birth/Sex: July 11, 1950 (67 y.o. M) Treating RN: Ahmed Prima Primary Care Provider: Lamonte Sakai Other Clinician: Referring Provider: Lamonte Sakai Treating Provider/Extender: Melburn Hake, [name] Weeks in Treatment: 27 Constitutional  Well-nourished and well-hydrated in no acute distress. Respiratory normal breathing without difficulty. clear to auscultation bilaterally. Cardiovascular regular rate and rhythm with normal S1, S2. Psychiatric this patient is able to make decisions and demonstrates good insight into disease process. Alert and Oriented x 3. pleasant and cooperative. Notes Patient's wounds appear to be fairly superficial at this time and seemed to be doing well in regard to his bilateral toes. Overall I'm pleased with the progress she's made since I've last seen him obviously he typically sees Leah on Thursdays. Mechanically I did debride away any necrotic material today no sharp debridement was performed nor  necessary. Electronic Signature(s) Signed: 08/16/2017 2:32:11 PM By: Worthy Keeler PA-C Entered By: Worthy Keeler on 08/15/2017 17:14:36 Matteucci, [name] ([id number]) -------------------------------------------------------------------------------- Physician Orders Details Patient Name: Wangerin, Conor E. Date of Service: 08/15/2017 10:45 AM Medical Record Number: [id number] Patient Account Number: [id number] Date of Birth/Sex: 08-23-1950 (67 y.o. M) Treating RN: Ahmed Prima Primary Care Provider: Lamonte Sakai Other Clinician: Referring Provider: Lamonte Sakai Treating Provider/Extender: Melburn Hake, Lawton Dollinger Weeks in Treatment: 31 Verbal / Phone Orders: Yes Clinician: Carolyne Fiscal, Debi Read Back and Verified: Yes Diagnosis Coding ICD-10 Coding Code Description E11.621 Type 2 diabetes mellitus with foot ulcer I87.313 Chronic venous hypertension (idiopathic) with ulcer of bilateral lower extremity I89.0 Lymphedema, not elsewhere classified L97.522 Non-pressure chronic ulcer of other part of left foot with fat layer exposed L97.512 Non-pressure chronic ulcer of other part of right foot with fat layer exposed F17.218 Nicotine dependence, cigarettes, 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 abuse 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, right ankle and foot Wound Cleansing Wound #1 Right Toe Great o Clean wound with Normal Saline. - all wounds o Cleanse wound with mild soap and water - all wounds o May Shower, gently pat wound dry prior to applying new dressing. - all wounds Wound #2 Right Toe Second o Clean wound with Normal Saline. - all wounds o Cleanse wound with mild soap and water - all wounds o May Shower, gently pat wound dry prior to applying new dressing. - all wounds Wound #5 Left Toe Great o Clean wound with Normal Saline. - all wounds o Cleanse wound with mild soap and water - all wounds o May Shower,  gently pat wound dry prior to applying new dressing. - all wounds Wound #6 Left Toe Second o Clean wound with Normal Saline. - all wounds o Cleanse wound with mild soap and water - all wounds o May Shower, gently pat wound dry prior to applying new dressing. - all wounds Anesthetic (add to Medication List) Wound #1 Right Toe Great o Topical Lidocaine 4% cream applied to wound bed prior to debridement (In Clinic Only). - all wounds Wound #2 Right Toe Second o Topical Lidocaine 4% cream applied to wound bed prior to debridement (In Clinic Only). - all wounds Wound #5 Left Toe Great o Topical Lidocaine 4% cream applied to wound bed prior to debridement (In Clinic Only). - all wounds Rohrer, Maximos E. ([id number]) Wound #6 Left Toe Second o Topical Lidocaine 4% cream applied to wound bed prior to debridement (In Clinic Only). - all wounds Primary Wound Dressing Wound #1 Right Toe Great o Silver Alginate - between all toes o Hydrafera Blue Ready Transfer - all wounds Wound #2 Right Toe Second o Silver Alginate - between all toes o Hydrafera Blue Ready Transfer - all wounds Wound #5 Left Toe Great o Silver Alginate - between all toes o Hydrafera  Blue Ready Transfer - all wounds Wound #6 Left Toe Second o Silver Alginate - between all toes o Hydrafera Blue Ready Transfer - all wounds Secondary Dressing Wound #1 Right Toe Great o ABD pad o Kerlix and Coban Wound #2 Right Toe Second o ABD pad o Kerlix and Coban Wound #5 Left Toe Great o ABD pad o Kerlix and Coban Wound #6 Left Toe Second o ABD pad o Kerlix and Coban Dressing Change Frequency Wound #1 Right Toe Great o Dressing is to be changed Monday and Thursday. - all wounds Wound #2 Right Toe Second o Dressing is to be changed Monday and Thursday. - all wounds Wound #5 Left Toe Great o Dressing is to be changed Monday and Thursday. - all wounds Wound #6 Left Toe Second o  Dressing is to be changed Monday and Thursday. - all wounds Follow-up Appointments Wound #1 Right Toe Great o Return Appointment in 1 week. o Nurse Visit as needed - Monday ZIYON, SOLTAU ([id number]) Wound #2 Right Toe Second o Return Appointment in 1 week. o Nurse Visit as needed - Monday Wound #5 Left Toe Great o Return Appointment in 1 week. o Nurse Visit as needed - Monday Wound #6 Left Toe Second o Return Appointment in 1 week. o Nurse Visit as needed - Monday Edema Control o Patient to wear own compression stockings o Elevate legs to the level of the heart and pump ankles as often as possible Additional Orders / Instructions Wound #1 Right Toe Great o Stop Smoking o Increase protein intake. o Other: - Please add vitamin A, vitamin C and zinc supplements to your diet Patient Medications Allergies: No Known Drug Allergies Notifications Medication Indication Start End lidocaine DOSE 1 - topical 4 % cream - 1 cream topical Electronic Signature(s) Signed: 08/16/2017 2:32:11 PM By: Worthy Keeler PA-C Signed: 08/16/2017 4:00:06 PM By: Alric Quan Entered By: Alric Quan on 08/15/2017 12:01:04 Atwater, Dael E. ([id number]) -------------------------------------------------------------------------------- Prescription 08/15/2017 Patient Name: [name] Provider: Worthy Keeler PA-C Date of Birth: [birth year] NPI#: [id number] Sex: Jerilynn Mages DEA#: [id number] Phone #: [phone] License #: Patient Address: [facility] [place], [place] [address] [address] [place], [place] [address] [phone] Allergies No Known Drug Allergies Medication Medication: Route: Strength: Form: lidocaine topical 4% cream Class: TOPICAL LOCAL ANESTHETICS Dose: Frequency / Time: Indication: 1 1 cream topical Number of Refills: Number of Units: 0 Generic  Substitution: Start Date: End Date: Administered at [place]: Yes Time Administered: Time Discontinued: Note to Pharmacy: Signature(s): Date(s): Electronic Signature(s) Signed: 08/16/2017 2:32:11 PM By: Worthy Keeler PA-C Signed: 08/16/2017 4:00:06 PM By: Alric Quan Entered By: Alric Quan on 08/15/2017 12:01:06 Ponce, Waleed E. ([id number]) Crandall, Dairon E. ([id number]) --------------------------------------------------------------------------------  Problem List Details Patient Name: [name], [name] Date of Service: 08/15/2017 10:45 AM Medical Record Number: [id number] Patient Account Number: [id number] Date of Birth/Sex: May 29, 1950 (67 y.o. M) Treating RN: Ahmed Prima Primary Care Provider: Lamonte Sakai Other Clinician: Referring Provider: Lamonte Sakai Treating Provider/Extender: Melburn Hake, Harwood Nall Weeks in Treatment: 13 Active Problems ICD-10 Impacting Encounter Code Description Active Date Wound Healing Diagnosis E11.621 Type 2 diabetes mellitus with foot ulcer 09/21/2016 Yes I87.313 Chronic venous hypertension (idiopathic) with ulcer of 09/21/2016 Yes bilateral lower extremity I89.0 Lymphedema, not elsewhere classified 09/21/2016 Yes L97.522 Non-pressure chronic ulcer of other part of left foot with fat 09/21/2016 Yes layer exposed L97.512 Non-pressure chronic ulcer of other part  of right foot with fat 09/21/2016 Yes layer exposed F17.218 Nicotine dependence, cigarettes, with other nicotine-induced 09/21/2016 Yes disorders F10.19 Alcohol abuse with unspecified alcohol-induced disorder 09/21/2016 Yes M86.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, right ankle and foot 02/21/2017 Yes Inactive Problems Resolved Problems Electronic Signature(s) ARMANIE, MARTINE ([id number]) Signed: 08/16/2017 2:32:11 PM By: Worthy Keeler PA-C Entered By: Worthy Keeler on 08/15/2017 10:57:12 [name], [name]  ([id number]) -------------------------------------------------------------------------------- Progress Note Details Patient Name: [name], [name] Date of Service: 08/15/2017 10:45 AM Medical Record Number: [id number] Patient Account Number: [id number] Date of Birth/Sex: April 05, 1951 (67 y.o. M) Treating RN: Ahmed Prima Primary Care Provider: Lamonte Sakai Other Clinician: Referring Provider: Lamonte Sakai Treating Provider/Extender: Melburn Hake, Maansi Wike Weeks in Treatment: 21 Subjective Chief Complaint Information obtained from Patient Patient presents for treatment of an open diabetic ulcer to both feet History of Present Illness (HPI) The following HPI elements were documented for the patient's wound: Location: bilateral feet ulceration on the toes Quality: Patient reports experiencing a dull pain to affected area(s). Severity: Patient states wound are getting better Duration: Patient has had the wound for > 3 months prior to seeking treatment at the wound center Timing: Pain in wound is constant (hurts all the time) Context: The wound would happen gradually Modifying Factors: Other treatment(s) tried include:treatment for lymphedema and is seen by the podiatrist Dr. Caryl [name] Associated Signs and Symptoms: Patient reports having increase swelling. 67 year old patient here to see as for bilateral feet ulceration to on his left first and second toe and 2 on his right first and second toe, which she's had for about 4 months. 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 Donella [name]. The patient is also undergoing workup by medical oncology for a intra-abdominal lymphadenopathy. 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 He is also status post appendectomy,  inguinal lymph node biopsy, rectal biopsy and rectal examination under anesthesia. he currently smokes cigarettes about half packet a day. In March of this year he was seen by Dr. Hortencia [name], for evaluation of bilateral varicose veins and besides wearing compression stockings he had recommended laser ablation of the right and left great saphenous veins to eleviate the symptoms and complications of severe superficial venous reflux disease. He also recommended lymphedema pumps for better control of his lymphedema. The patient recently has had on 08/23/2016, right greater saphenous vein ablation with the laser energy Earlier lower extremity venous reflux examination done on 05/08/2016 showed no DVT or SVT both lower legs but incompetence of bilateral great saphenous veins was present. A lower arterial study was also done and there was no significant right lower and left lower extremity problems based on a normal toe brachial index bilaterally and the ABI was 1.21 the left and 1.23 on the right. His post ablation venous duplex examination showed successful ablation of the right GS vein with thrombus formation 2 below the right saphenofemoral junction. The deep system was patent without evidence of thrombosis and this was done on 08/30/2016. 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 Addendum: regarding his x-rays done today and x-ray of the left foot -- IMPRESSION: No objective evidence of osteomyelitis. There are soft tissue changes which  may reflect cellulitis. X-ray of the right foot -- IMPRESSION:Findings compatible with cellulitis of the toes. No objective evidence of osteomyelitis is observed. 10/01/16 on evaluation today patient's wounds appeared to be doing some better. I did review the x-rays as well which showed no evidence of osteomyelitis although there was evidence on x-ray  of cellulitis. He fortunately is not having any discomfort Bachmann, Mohan E. ([id number]) although he continues to have some swelling. He does not remember being on any antibiotics recently. 10/15/16 on evaluation today patient's wounds overall appear to be doing better although he does have a new location noted on the left foot. Fortunately he is not having significant pain. It almost has the appearance that something is rubbing on the end of his toes but he wears the open toe shoes and according to what he is telling me never wears anything that would rub on his foot. There is no evidence of infection and specifically no evidence of a fungal infection 10/22/16 On evaluation today patient's wounds appeared to be doing better compared to last week in regard to his bilateral lower extremities. Fortunately I happy with how things are progressing although he still has ulcers I feel like that he is improving and appropriate manner. 11/12/16 on evaluation today patient appears to be doing well in regard to his bilateral feet and the respective wounds. We have been using surrounding her dressings along with an antifungal cream which seems to be doing very well. He has no bilateral dysfunction noticed that the rituals are weight loss at this point. He also has no nausea or vomiting a note purulent discharge. He did see Vein and vascular today and he tells me that they told him he could have surgery for his venous stasis but they did not feel like it was worth it in his words. Fortunately patient's wounds do appear to be getting sneakily better. 11/26/2016 -- he says he is going to have some surgery during this week at [facility] for possibly a colon resection. 12/31/2016 -- the patient has been noncompliant with his smoking and I'm not sure whether he is also started drinking again. He continues to be very nonchalant about his care 01/14/2017 -- the patient's HandP has been reviewed well and I understand he is  being compliant with trying to give up smoking and his local dressing changes. He does not have any surgical options of 4 to him by his vascular surgeons.he was last seen in early August by Dr. Hortencia [name] who recommended compression stockings,and possibly lymph pumps in 2-3 months after doing a review ultrasound. 01/28/2017 - the patient did not have any fresh complaints but on examination I noted a large lacerated wound on the plantar aspect of his right fourth toe which had a lot of necrotic debris and it probes down to bone. 02/07/2017 -- x-ray of the right foot -- IMPRESSION: Soft tissue swelling about the first through fourth toes consistent with cellulitis. New destructive change in the tuft of the distal phalanx of the great toe is consistent with osteomyelitis. 02/14/2017 -- the patient's MRI is pending this coming Monday and he still continues to smoke. We have again gone over off loading of his wounds in great detail and he says he's been compliant. 02/21/2017 -- MR of the right foot -- IMPRESSION: 1. Soft tissue ulcer at the tip of the first, second and third toe knows. Cortical irregularity and bone marrow edema in the first distal phalanx most concerning for osteomyelitis. Mild marrow  edema in the second and third distal phalanx without definite cortical destruction which may reflect early osteomyelitis versus reactive marrow edema. 2. Soft tissue edema surrounding the first phalanx most consistent with cellulitis. the patient was also recently evaluated by his medical oncologist Dr. 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 She is treating him with weekly Procrit. She is also keeping intra-abdominal lymphadenopathy and right lower lobe lung nodule under observation. 04/04/2017 -- he was seen by Dr. Adrian Prows on 03/25/2017 -- after review he empirically put him on ciprofloxacin and doxycycline as they  have good bone penetration and good bioavailability and it will cover the usual pathogens and diabetic foot osteomyelitis. He will check inflammatory markers and plan a 31-65 week old records. C-reactive protein was 0.3 and the ESR was 72 04/18/17 on evaluation today patient appears to be doing about the same in regard to his lower extremity wounds bilaterally. He has continued to use the antifungal cream which does seem to be beneficial. Nonetheless the ulcers do seem to in some areas be epithelial eyes over and in other areas are still open. He is having no significant discomfort. 04/25/17-he is here in follow-up evaluation for multiple ulcerations to multiple toes bilaterally. He states he did see Dr. Ola Spurr again last week and continues antibiotic therapy. He is voicing no complaints or concerns, will continue with current treatment plan will possibility of adding compression therapy next week after an additional week of treatment/lotions to BLE prescribed by Dr Ola Spurr 05/02/17 he is here in follow up for for multiple ulcers to multiple toes bilaterally. we will stop using antifungal cream and will continue with silvercel and follow up next week [name], Veldon E. ([id number]) 05/09/17-he is here in follow-up for multiple ulcerations to multiple toes bilaterally. There is improvement in appearance. He has not completely stopped using antifungal cream, but admits he has not using it between the toes. He has an appointment with Dr. Ola Spurr on 2/11, continues on doxycycline and Cipro. It has been 5 weeks of antibiotic therapy, we will order plain film xray to evaluate for osteomyelitis next week, prior to follow up with ID. Will continue with silvercel and follow up next week 05/16/17-he is here in follow-up evaluation for multiple ulcerations to multiple toes bilaterally and new wound to the right posterior heel. There is essentially no change in appearance, deteriorating measurements; he has a  history of waxing and waning measurements. He admits that he continues to apply moisturizer/cream/ointment to his toes despite weekly reminders to only apply silvercel to his toes. He states that he thinks the surgical shoe contributed to the superficial ulcer to his posterior heel, he is unable to articulate if this was an area of dry cracked skin as he has a similar area to the left heel. He now is wearing open toed slippers. He has an appointment with Dr. Ola Spurr on 2/11. We have ordered x-rays for her bilateral feet; he was advised to obtain the x-rays today or tomorrow. He will follow-up next week 05/23/17-he is here in follow-up evaluation for multiple ulcerations to multiple toes bilaterally and the right posterior heel. There is improvement in maceration. He has been compliant and not applying any moisturizing agent to his toes. He has been using Lac-Hydrin for his lower extremities with improvement. He did not go to his appointment on Monday with Dr. Ola Spurr secondary to financial concerns. X-rays for her bilateral feet showed: LEFT FOOT with  slight erosion of the tuft of the distal phalanges of the left first and second toe suspicious for osteomyelitis, RIGHT FOOT with 1.erosion of the tufts of the distal phalanges of the right first second and possibly third toes consistent with osteomyelitis, 2 no definitive abnormality of the calcaneus is seen on the images obtained, 3. Plantar calcaneal degenerative spur. We briefly discussed hyperbaric adjunctive therapy for treatment of chronic refractory osteomyelitis. I do not find an a1c in EMR, will contact PCP for record, or order if needed. He has been encouraged to contact Dr Ola Spurr office regarding the follow-up appointment, encouraged him to inquire about payment plan. We will continue with same treatment plan and follow-up next week. He states he is still taking antibiotics and has "a lot" left. He states he has been taking them as  directed, 2 pills twice daily. According to Dr. Blane Ohara office notes he was originally started on 12/17 for 4 weeks and extended on 1/14 for an additional 4 weeks. He should be done with his antibiotic therapy, he was advised to bring his bottles and to his next appointment, we will contact pharmacy. 05/30/17-he is here in follow-up evaluation for multiple ulcerations to multiple toes bilaterally and the right posterior heel. He is accompanied by his brother-in-law. Wounds are stable. He has yet to make up with Dr. Ola Spurr. We contacted his PCP, with no record of recent A1c we will draw an A1c. His brother-in-law states that he was taken off all of his diabetic medication secondary to kidney function. 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 The antibiotics were counted and have approximately 2 weeks left, although they should be complete. The pill organizer reveals missing days. We discussed the need for consistent medications, to have optimal benefit of medication. He has a cousin that lives with him and he will ask her to check his organizer daily. He has been advised to follow up with Dr Ola Spurr, and will go by the office today. He has been advised to quit smoking. 06/06/17-he is here in follow up evaluation. He has had to make an appointment with Dr. Ola Spurr. He did have blood work obtained, a1c 5. He continues to take antibiotic therapy. Significant improvement in bilateral lower extremity edema with compression therapy. Essentially no change in ulcerations to toes. He states he is "going to try something different" and "let me know next week" if it works; he would not provide any additional information and was encouraged to follow our orders. We will follow up next week 06/13/17-he is here in follow-up evaluation. He has an appointment with Dr. Ola Spurr tomorrow morning. He states  he purchased an ointment from [facility] and applied to his toes for 3 days, he does not remember the name of the ointment. There is improvement to his wounds, minimal maceration. He continues to take antibiotic therapy, this should have been completed last month. His brother-in-law who regularly accompanies his appointments was asked to take the bottles to the appointment tomorrow with Dr. Ola Spurr so he is aware. We will continue with 3 layer compression, and order OPEN TOE compression 20-30mmHg; we will apply compression stockings next week. He continues to smoke, smoked "2 cigarettes" last week 06/20/17 on evaluation today patient did receive his compression stockings which he has with him today for both lower extremities. With that being said he tells me at this point in time that he is very happy to have these  he really is not a big fan of the compression wraps that we have been utilizing although they have been of great benefit for him. Nonetheless at this point he does want to switch to the compression stockings. In my opinion as long as he is continuing with compression I'm okay with the stockings or the wraps. 06/27/17-he is here in follow-up evaluation for multiple ulcerations to his bilateral toes. There is some improvement in appearance. He is compliant in wearing his compression stockings with significant improvement in lower extremity edema. He saw Dr Ola Spurr on 3/8, per his notes they would redraw ESR and CRP; plan to continue antibiotic therapy if these remain elevated. I do not see an ESR or CRP level in Epic. The patient continues to take antibiotics. 07/11/17-He is here in follow-up evaluation for multiple ulcerations to multiple toes bilaterally. He presents with complete epithelialization to the right third toe; there has been no deterioration. He continues on antibiotic therapy. He will follow-up next week 07/18/17-He is here in follow-up evaluation for multiple ulcerations to  multiple toes bilaterally. He continues to make improvement. He continues on antibiotic therapy. He states he has been using something additional to our orders, he does [name], [name] ([id number]) not elaborate but states he will bring it in next week. 07/25/17-He is here in follow up evaluation for multiple ulcerations to bilateral toes. He is stable. He has completed antibiotic therapy. He admits to "filing" his toes after showers each evening, this is what he was referencing last week; he does not filing for the wounds. We will switch to [facility] and monitor for any improvement, he will follow-up next week 08/01/17-He is here in follow-up evaluation. He admits to "picking" at his toes after cleansing yesterday, leading to new areas of tissue loss on the bilateral second toe. There is improvement noted to the bilateral great toe. We will dress toes today and hope that they maintain until Monday where he will come in for a nurse visit. He has been advised, multiple times with expressed verbalization, to change the dressings to silvercel if the dressings get wet prior to [facility] appointment. 08/08/17-He is here in follow-up evaluation for bilateral first and second toe ulcerations. There is significant improvement to all ulcerations since last visit. We will switch to [facility] to all wounds and he will continue with nurse visits on a Monday/Thursday schedule and follow-up with me in 2 weeks. He continues to smoke, 1-3 cigarettes per day, and has been encouraged to not smoke until his next follow-up in 2 weeks. 08/15/17 on evaluation today patient's ulcers on his toes actually appear to be doing fairly well the right to ulcers may be a little bit more moist compared to the left I'm not really sure exactly why as the openings appear to be very small and I'm not seeing any evidence of anything significant as far as you lightest or otherwise. Nonetheless this may just be a small setback  he's been doing very well with the [facility] Dressing Patient History Information obtained from Patient. Social History Current every day smoker, Marital Status - Widowed, Alcohol Use - Daily - quit drinking about a week ago, Drug Use - No History, Caffeine Use - Moderate. 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 Respiratory The patient has no complaints or symptoms. Cardiovascular The patient has no complaints or  symptoms. Psychiatric The patient has no complaints or symptoms. Objective Constitutional Well-nourished and well-hydrated in no acute distress. Vitals Time Taken: 11:06 AM, Height: 69 in, Weight: 168 lbs, BMI: 24.8, Temperature: 98.3 F, Pulse: 66 bpm, Respiratory Rate: 16 breaths/min, Blood Pressure: 149/76 mmHg. Respiratory normal breathing without difficulty. clear to auscultation bilaterally. [name], [name] ([id number]) Cardiovascular regular rate and rhythm with normal S1, S2. Psychiatric this patient is able to make decisions and demonstrates good insight into disease process. Alert and Oriented x 3. pleasant and cooperative. General Notes: Patient's wounds appear to be fairly superficial at this time and seemed to be doing well in regard to his bilateral toes. Overall I'm pleased with the progress she's made since I've last seen him obviously he typically sees Leah on Thursdays. Mechanically I did debride away any necrotic material today no sharp debridement was performed nor necessary. Integumentary (Hair, Skin) Wound #1 status is Open. Original cause of wound was Gradually Appeared. The wound is located on the Right Toe Great. The wound measures 1cm length x 2cm width x 0.3cm depth; 1.571cm^2 area and 0.471cm^3 volume. There is a medium amount of serosanguineous drainage  noted. The wound margin is indistinct and nonvisible. There is small (1-33%) red granulation within the wound bed. There is a medium (34-66%) amount of necrotic tissue within the wound bed including Adherent Slough. The periwound skin appearance did not exhibit: Callus, Crepitus, Excoriation, Induration, Rash, Scarring, Dry/Scaly, Maceration, Atrophie Blanche, Cyanosis, Ecchymosis, Hemosiderin Staining, Mottled, Pallor, Rubor, Erythema. Wound #2 status is Open. Original cause of wound was Gradually Appeared. The wound is located on the Right Toe Second. The wound measures 0.5cm length x 1.9cm width x 0.1cm depth; 0.746cm^2 area and 0.075cm^3 volume. There is Fat Layer (Subcutaneous Tissue) Exposed exposed. There is a small amount of serosanguineous drainage noted. The wound margin is distinct with the outline attached to the wound base. There is medium (34-66%) red granulation within the wound bed. There is a small (1-33%) amount of necrotic tissue within the wound bed including Adherent Slough. The periwound skin appearance did not exhibit: Callus, Crepitus, Excoriation, Induration, Rash, Scarring, Dry/Scaly, Maceration, Atrophie Blanche, Cyanosis, Ecchymosis, Hemosiderin Staining, Mottled, Pallor, Rubor, Erythema. Periwound temperature was noted as No Abnormality. Wound #5 status is Open. Original cause of wound was Gradually Appeared. The wound is located on the Left Toe Great. The wound measures 0.2cm length x 1cm width x 0.1cm depth; 0.157cm^2 area and 0.016cm^3 volume. There is no tunneling or undermining noted. There is a none present amount of drainage noted. The wound margin is flat and intact. There is large (67-100%) pink granulation within the wound bed. The periwound skin appearance did not exhibit: Callus, Crepitus, Excoriation, Induration, Rash, Scarring, Dry/Scaly, Maceration, Atrophie Blanche, Cyanosis, Ecchymosis, Hemosiderin Staining, Mottled, Pallor, Rubor, Erythema. Wound #6  status is Open. Original cause of wound was Gradually Appeared. The wound is located on the Left Toe Second. The wound measures 0.2cm length x 0.2cm width x 0.1cm depth; 0.031cm^2 area and 0.003cm^3 volume. There is no tunneling or undermining noted. There is a none present amount of drainage noted. The wound margin is distinct with the outline attached to the wound base. There is large (67-100%) pale granulation within the wound bed. There is no necrotic tissue within the wound bed. Periwound temperature was noted as No Abnormality. 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 ([id number]) Plan Wound Cleansing: Wound #1 Right Toe Great: Clean wound with Normal Saline. - all wounds Cleanse wound with mild soap and water - all wounds May Shower, gently pat wound dry prior to applying new dressing. - all wounds Wound #2 Right Toe Second: Clean wound with Normal Saline. - all wounds Cleanse wound with mild soap and water - all wounds May Shower, gently pat wound dry prior to applying new dressing. - all wounds Wound #5 Left Toe Great: Clean wound with Normal Saline. - all wounds Cleanse wound with mild soap and water - all wounds May Shower, gently pat wound dry prior to applying new dressing. - all wounds Wound #6 Left Toe Second: Clean wound with Normal Saline. - all wounds Cleanse wound with mild soap and water - all  wounds May Shower, gently pat wound dry prior to applying new dressing. - all wounds Anesthetic (add to Medication List): Wound #1 Right Toe Great: Topical Lidocaine 4% cream applied to wound bed prior to debridement (In Clinic Only). - all wounds Wound #2 Right Toe Second: Topical Lidocaine 4% cream applied to wound bed prior to debridement (In Clinic Only). - all wounds Wound #5 Left Toe Great: Topical Lidocaine 4% cream applied to wound bed prior to debridement (In Clinic Only). - all wounds Wound #6 Left Toe Second: Topical Lidocaine 4% cream applied to wound bed prior to debridement (In Clinic Only). - all wounds Primary Wound Dressing: Wound #1 Right Toe Great: Silver Alginate - between all toes Hydrafera Blue Ready Transfer - all wounds Wound #2 Right Toe Second: Silver Alginate - between all toes Hydrafera Blue Ready Transfer - all wounds Wound #5 Left Toe Great: Silver Alginate - between all toes Hydrafera Blue Ready Transfer - all wounds Wound #6 Left Toe Second: Silver Alginate - between all toes Hydrafera Blue Ready Transfer - all wounds Secondary Dressing: Wound #1 Right Toe Great: ABD pad Kerlix and Coban Wound #2 Right Toe Second: ABD pad Kerlix and Coban Wound #5 Left Toe Great: ABD pad Kerlix and Coban Wound #6 Left Toe Second: ABD pad [name], [name] ([id number]) Kerlix and Coban Dressing Change Frequency: Wound #1 Right Toe Great: Dressing is to be changed Monday and Thursday. - all wounds Wound #2 Right Toe Second: Dressing is to be changed Monday and Thursday. - all wounds Wound #5 Left Toe Great: Dressing is to be changed Monday and Thursday. - all wounds Wound #6 Left Toe Second: Dressing is to be changed Monday and Thursday. - all wounds Follow-up Appointments: Wound #1 Right Toe Great: Return Appointment in 1 week. Nurse Visit as needed - Monday Wound #2 Right Toe Second: Return Appointment in 1 week. Nurse Visit as needed - Monday Wound  #5 Left Toe Great: Return Appointment in 1 week. Nurse Visit as needed - Monday Wound #6 Left Toe Second: Return Appointment in 1 week. Nurse Visit as needed - Monday Edema Control: Patient to wear own compression stockings Elevate legs to the level of the heart and pump ankles as often as possible Additional Orders / Instructions: Wound #1 Right Toe Great: Stop Smoking Increase protein intake. Other: -  Please add vitamin A, vitamin C and zinc supplements to your diet The following medication(s) was prescribed: lidocaine topical 4 % cream 1 1 cream topical was prescribed at facility I'm gonna suggest that we continue with the Current wound care measures for the next week. Patient is in agreement the plan. Please see above for specific wound care orders. We will see patient for re-evaluation in 1 week(s) here in the clinic. If anything worsens or changes patient will contact our office for additional recommendations. Electronic Signature(s) Signed: 08/16/2017 2:32:11 PM By: Worthy Keeler PA-C Entered By: Worthy Keeler on 08/15/2017 17:14:53 Dehnert, [name] ([id number]) -------------------------------------------------------------------------------- ROS/PFSH Details Patient Name: Lombardozzi, Garnie E. Date of Service: 08/15/2017 10:45 AM Medical Record Number: [id number] Patient Account Number: [id number] Date of Birth/Sex: [birth year] (67 y.o. M) Treating RN: Ahmed Prima Primary Care Provider: Lamonte Sakai Other Clinician: Referring Provider: Lamonte Sakai Treating Provider/Extender: Melburn Hake, Lowana Hable Weeks in Treatment: 83 Information Obtained From Patient Wound History Do you currently have one or more open woundso Yes How many open wounds do you currently haveo 6 Approximately how long have you had your woundso 3 months How have you been treating your wound(s) until nowo ointment and bandage Has your wound(s) ever healed and then re-openedo No Have you had any lab work done in the  past montho No Have you tested positive for an antibiotic resistant organism (MRSA, VRE)o No Have you tested positive for osteomyelitis (bone infection)o No Have you had any tests for circulation on your legso Yes Who ordered the testo PCP Where was the test doneo [facility] Constitutional Symptoms (General Health) Complaints and Symptoms: Negative for: Fever; Chills Eyes Medical History: Negative for: Cataracts; Glaucoma; Optic Neuritis Ear/Nose/Mouth/Throat Medical History: Negative for: Chronic sinus problems/congestion; Middle ear problems Hematologic/Lymphatic Medical History: Positive for: Anemia; Lymphedema Negative for: Hemophilia; Human Immunodeficiency Virus; Sickle Cell Disease Respiratory Complaints and Symptoms: No Complaints or Symptoms Medical History: Negative for: Aspiration; Asthma; Chronic Obstructive Pulmonary Disease (COPD); Pneumothorax; Sleep Apnea; Tuberculosis Cardiovascular Complaints and Symptoms: No Complaints or Symptoms [name], [name] ([id number]) Medical History: Positive for: Congestive Heart Failure; Hypertension; Peripheral Venous Disease Negative for: Angina; Arrhythmia; Coronary Artery Disease; Deep Vein Thrombosis; Hypotension; Myocardial Infarction; Peripheral Arterial Disease; Phlebitis; Vasculitis Gastrointestinal Medical History: Negative for: Cirrhosis ; Colitis; Crohnos; Hepatitis A; Hepatitis B; Hepatitis C Endocrine Medical History: Positive for: Type II Diabetes Treated with: Oral agents Blood sugar tested every day: Yes Tested : QD Genitourinary Medical History: Negative for: End Stage Renal Disease Immunological Medical History: Negative for: Lupus Erythematosus; Raynaudos; Scleroderma Integumentary (Skin) Medical History: Negative for: History of Burn; History of pressure wounds Musculoskeletal Medical History: Negative for: Gout; Rheumatoid Arthritis; Osteoarthritis; Osteomyelitis Neurologic Medical History: Positive  for: Neuropathy Negative for: Dementia; Quadriplegia; Paraplegia; Seizure Disorder Oncologic Medical History: Past Medical History Notes: 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 ([id number]) Immunization Notes: up to date Implantable Devices Family and Social History Current every day smoker; Marital Status - Widowed; Alcohol Use: Daily - quit drinking about a week ago; Drug Use: No History; Caffeine Use: Moderate; Financial Concerns: No; Food, Clothing or Shelter Needs: No; Support System Lacking: No; Transportation Concerns: No; Advanced Directives: No; Patient does not want information on Advanced Directives Physician Affirmation I have reviewed and agree with the above information. Electronic Signature(s) Signed: 08/16/2017 2:32:11 PM By: Worthy Keeler PA-C Signed: 08/16/2017 4:00:06  PM By: Alric Quan Entered By: Worthy Keeler on 08/15/2017 17:14:23 [name], [name] ([id number]) -------------------------------------------------------------------------------- SuperBill Details Patient Name: Palau, Donnell E. Date of Service: 08/15/2017 Medical Record Number: [id number] Patient Account Number: [id number] Date of Birth/Sex: 1951/01/07 (67 y.o. M) Treating RN: Ahmed Prima Primary Care Provider: Lamonte Sakai Other Clinician: Referring Provider: Lamonte Sakai Treating Provider/Extender: Melburn Hake, Anwitha Mapes Weeks in Treatment: 46 Diagnosis Coding ICD-10 Codes Code Description E11.621 Type 2 diabetes mellitus with foot ulcer I87.313 Chronic venous hypertension (idiopathic) with ulcer of bilateral lower extremity I89.0 Lymphedema, not elsewhere classified L97.522 Non-pressure chronic ulcer of other part of left foot with fat layer exposed L97.512 Non-pressure chronic ulcer of other part of right foot with  fat layer exposed F17.218 Nicotine dependence, cigarettes, 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 abuse 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, right ankle and foot Facility Procedures CPT4 Code: [id number] Description: 99214 - WOUND CARE VISIT-LEV 4 EST PT Modifier: Quantity: 1 Physician Procedures CPT4 Code Description: [id number] - WC PHYS LEVEL 3 - EST PT ICD-10 Diagnosis Description I87.313 Chronic venous hypertension (idiopathic) with ulcer of bilateral E11.621 Type 2 diabetes mellitus with foot ulcer I89.0 Lymphedema, not elsewhere  classified L97.522 Non-pressure chronic ulcer of other part of left foot with fat l Modifier: lower extremi ayer exposed Quantity: 1 ty Electronic Signature(s) Signed: 08/16/2017 2:32:11 PM By: Worthy Keeler PA-C Entered By: Worthy Keeler on 08/15/2017 17:15:10

## 2017-08-21 NOTE — Progress Notes (Signed)
VIKRAM, TILLETT ([id number]) Visit Report for 08/19/2017 Arrival Information Details Patient Name: [name], [name] Date of Service: 08/19/2017 9:15 AM Medical Record Number: [id number] Patient Account Number: [id number] Date of Birth/Sex: Aug 31, 1950 (67 y.o. M) Treating RN: Cornell Barman Primary Care Shannan Garfinkel: Lamonte Sakai Other Clinician: Referring Adiah Guereca: Lamonte Sakai Treating Chayanne Speir/Extender: Melburn Hake, HOYT Weeks in Treatment: 67 Visit Information History Since Last Visit Added or deleted any medications: No Patient Arrived: Ambulatory Any new allergies or adverse reactions: No Arrival Time: 09:29 Had a fall or experienced change in No Accompanied By: friend activities of daily living that may affect Transfer Assistance: None risk of falls: Patient Identification Verified: Yes Signs or symptoms of abuse/neglect since last visito No Secondary Verification Process Completed: Yes Hospitalized since last visit: No Patient Requires Transmission-Based No Implantable device outside of the clinic excluding No Precautions: cellular tissue based products placed in the center Patient Has Alerts: Yes since last visit: Patient Alerts: DMII Has Dressing in Place as Prescribed: Yes Pain Present Now: No Electronic Signature(s) Signed: 08/20/2017 5:51:43 PM By: Gretta Cool, BSN, RN, CWS, Kim RN, BSN Entered By: Gretta Cool, BSN, RN, CWS, Kim on 08/19/2017 09:30:06 Suddeth, [name] ([id number]) -------------------------------------------------------------------------------- Clinic Level of Care Assessment Details Patient Name: Stender, Gevork E. Date of Service: 08/19/2017 9:15 AM Medical Record Number: [id number] Patient Account Number: [id number] Date of Birth/Sex: Sep 15, 1950 (67 y.o. M) Treating RN: Cornell Barman Primary Care Savanna Dooley: Lamonte Sakai Other Clinician: Referring Jameila Keeny: Lamonte Sakai Treating Shaheen Star/Extender: Melburn Hake, HOYT Weeks in Treatment: 85 Clinic Level of Care Assessment  Items TOOL 4 Quantity Score []  - Use when only an EandM is performed on FOLLOW-UP visit 0 ASSESSMENTS - Nursing Assessment / Reassessment []  - Reassessment of Co-morbidities (includes updates in patient status) 0 []  - 0 Reassessment of Adherence to Treatment Plan ASSESSMENTS - Wound and Skin Assessment / Reassessment X - Simple Wound Assessment / Reassessment - one wound 1 5 []  - 0 Complex Wound Assessment / Reassessment - multiple wounds []  - 0 Dermatologic / Skin Assessment (not related to wound area) ASSESSMENTS - Focused Assessment []  - Circumferential Edema Measurements - multi extremities 0 []  - 0 Nutritional Assessment / Counseling / Intervention []  - 0 Lower Extremity Assessment (monofilament, tuning fork, pulses) []  - 0 Peripheral Arterial Disease Assessment (using hand held doppler) ASSESSMENTS - Ostomy and/or Continence Assessment and Care []  - Incontinence Assessment and Management 0 []  - 0 Ostomy Care Assessment and Management (repouching, etc.) PROCESS - Coordination of Care X - Simple Patient / Family Education for ongoing care 1 15 []  - 0 Complex (extensive) Patient / Family Education for ongoing care []  - 0 Staff obtains Programmer, systems, Records, Test Results / Process Orders []  - 0 Staff telephones HHA, Nursing Homes / Clarify orders / etc []  - 0 Routine Transfer to another Facility (non-emergent condition) []  - 0 Routine Hospital Admission (non-emergent condition) []  - 0 New Admissions / Biomedical engineer / Ordering NPWT, Apligraf, etc. []  - 0 Emergency Hospital Admission (emergent condition) X- 1 10 Simple Discharge Coordination [place], Treavon E. ([id number]) []  - 0 Complex (extensive) Discharge Coordination PROCESS - Special Needs []  - Pediatric / Minor Patient Management 0 []  - 0 Isolation Patient Management []  - 0 Hearing / Language / Visual special needs []  - 0 Assessment of Community assistance (transportation, D/C planning, etc.) []  -  0 Additional assistance / Altered mentation []  - 0 Support Surface(s) Assessment (bed, cushion, seat, etc.) INTERVENTIONS - Wound Cleansing / Measurement []  - Simple Wound Cleansing -  one wound 0 X- 4 5 Complex Wound Cleansing - multiple wounds X- 1 5 Wound Imaging (photographs - any number of wounds) []  - 0 Wound Tracing (instead of photographs) []  - 0 Simple Wound Measurement - one wound X- 4 5 Complex Wound Measurement - multiple wounds INTERVENTIONS - Wound Dressings []  - Small Wound Dressing one or multiple wounds 0 X- 2 15 Medium Wound Dressing one or multiple wounds []  - 0 Large Wound Dressing one or multiple wounds []  - 0 Application of Medications - topical []  - 0 Application of Medications - injection INTERVENTIONS - Miscellaneous []  - External ear exam 0 []  - 0 Specimen Collection (cultures, biopsies, blood, body fluids, etc.) []  - 0 Specimen(s) / Culture(s) sent or taken to Lab for analysis []  - 0 Patient Transfer (multiple staff / Civil Service fast streamer / Similar devices) []  - 0 Simple Staple / Suture removal (25 or less) []  - 0 Complex Staple / Suture removal (26 or more) []  - 0 Hypo / Hyperglycemic Management (close monitor of Blood Glucose) []  - 0 Ankle / Brachial Index (ABI) - do not check if billed separately []  - 0 Vital Signs [place], Yunus E. ([id number]) Has the patient been seen at the hospital within the last three years: Yes Total Score: 105 Level Of Care: New/Established - Level 3 Electronic Signature(s) Signed: 08/20/2017 5:51:43 PM By: Gretta Cool, BSN, RN, CWS, Kim RN, BSN Entered By: Gretta Cool, BSN, RN, CWS, Kim on 08/19/2017 09:49:29 Ozment, [name] ([id number]) -------------------------------------------------------------------------------- Encounter Discharge Information Details Patient Name: Porro, Peace E. Date of Service: 08/19/2017 9:15 AM Medical Record Number: [id number] Patient Account Number: [id number] Date of Birth/Sex: March 31, 1951 (67 y.o.  M) Treating RN: Cornell Barman Primary Care Varun Jourdan: Lamonte Sakai Other Clinician: Referring Emilee Market: Lamonte Sakai Treating Abdulla Pooley/Extender: Melburn Hake, HOYT Weeks in Treatment: 98 Encounter Discharge Information Items Discharge Condition: Stable Ambulatory Status: Ambulatory Discharge Destination: Home Transportation: Private Auto Accompanied By: Loree Fee Schedule Follow-up Appointment: No Clinical Summary of Care: Electronic Signature(s) Signed: 08/20/2017 5:51:43 PM By: Gretta Cool, BSN, RN, CWS, Kim RN, BSN Entered By: Gretta Cool, BSN, RN, CWS, Kim on 08/19/2017 09:48:10 Rathel, [name] ([id number]) -------------------------------------------------------------------------------- Patient/Caregiver Education Details Patient Name: [name] Date of Service: 08/19/2017 9:15 AM Medical Record Number: [id number] Patient Account Number: [id number] Date of Birth/Gender: 05-10-1950 (67 y.o. M) Treating RN: Cornell Barman Primary Care Physician: Lamonte Sakai Other Clinician: Referring Physician: Lamonte Sakai Treating Physician/Extender: Sharalyn Ink in Treatment: 37 Education Assessment Education Provided To: Patient Education Topics Provided Welcome To [facility]: Wound/Skin Impairment: Handouts: Caring for Your Ulcer, Other: other wound care as prescribed Methods: Explain/Verbal Responses: State content correctly Electronic Signature(s) Signed: 08/20/2017 5:51:43 PM By: Gretta Cool, BSN, RN, CWS, Kim RN, BSN Entered By: Gretta Cool, BSN, RN, CWS, Kim on 08/19/2017 09:47:16 Savage, [name] ([id number]) -------------------------------------------------------------------------------- Wound Assessment Details Patient Name: Soeder, Archer E. Date of Service: 08/19/2017 9:15 AM Medical Record Number: [id number] Patient Account Number: [id number] Date of Birth/Sex: 09/15/1950 (67 y.o. M) Treating RN: Cornell Barman Primary Care Roxanna Mcever: Lamonte Sakai Other Clinician: Referring Telitha Plath: Lamonte Sakai Treating Malasha Kleppe/Extender: Melburn Hake, HOYT Weeks in Treatment: 9 Wound Status Wound Number: 1 Primary Diabetic Wound/Ulcer of the Lower Extremity Etiology: Wound Location: Right Toe Great Wound Open Wounding Event: Gradually Appeared Status: Date Acquired: 06/11/2016 Comorbid Anemia, Lymphedema, Congestive Heart Weeks Of Treatment: 47 History: Failure, Hypertension, Peripheral Venous Clustered Wound: No Disease, Type II Diabetes, Neuropathy Pending Amputation On Presentation Photos Photo Uploaded By: Gretta Cool BSN, RN, CWS, Kim  on 08/20/2017 08:32:41 Wound Measurements Length: (cm) 0.3 Width: (cm) 0.5 Depth: (cm) 0.1 Area: (cm) 0.118 Volume: (cm) 0.012 % Reduction in Area: 99.7% % Reduction in Volume: 99.7% Epithelialization: Small (1-33%) Tunneling: No Undermining: No Wound Description Classification: Grade 2 Foul Odor Wound Margin: Indistinct, nonvisible Slough/Fi Exudate Amount: Medium Exudate Type: Serosanguineous Exudate Color: red, brown After Cleansing: No brino Yes Wound Bed Granulation Amount: Small (1-33%) Exposed Structure Granulation Quality: Red Fascia Exposed: No Necrotic Amount: Small (1-33%) Fat Layer (Subcutaneous Tissue) Exposed: Yes Necrotic Quality: Adherent Slough Tendon Exposed: No Muscle Exposed: No Joint Exposed: No Bone Exposed: No Periwound Skin Texture Huether, Marvion E. ([id number]) Texture Color No Abnormalities Noted: No No Abnormalities Noted: No Callus: No Atrophie Blanche: No Crepitus: No Cyanosis: No Excoriation: No Ecchymosis: No Induration: No Erythema: No Rash: No Hemosiderin Staining: No Scarring: No Mottled: No Pallor: No Moisture Rubor: No No Abnormalities Noted: No Dry / Scaly: No Maceration: No Wound Preparation Ulcer Cleansing: Rinsed/Irrigated with Saline, Wound Cleanser Topical Anesthetic Applied: Other: lidocaine 4%, Treatment Notes Wound #1 (Right Toe Great) 1. Cleansed with: Clean wound  with Normal Saline 4. Dressing Applied: Hydrafera Blue 5. Secondary Dressing Applied ABD Pad Notes kerlix and coban Electronic Signature(s) Signed: 08/20/2017 5:51:43 PM By: Gretta Cool, BSN, RN, CWS, Kim RN, BSN Entered By: Gretta Cool, BSN, RN, CWS, Kim on 08/19/2017 09:33:38 Kendra, [name] ([id number]) -------------------------------------------------------------------------------- Wound Assessment Details Patient Name: Akard, Suhaan E. Date of Service: 08/19/2017 9:15 AM Medical Record Number: [id number] Patient Account Number: [id number] Date of Birth/Sex: [birth year] (67 y.o. M) Treating RN: Cornell Barman Primary Care Karrington Studnicka: Lamonte Sakai Other Clinician: Referring Boneta Standre: Lamonte Sakai Treating Israel Wunder/Extender: Melburn Hake, HOYT Weeks in Treatment: 44 Wound Status Wound Number: 2 Primary Diabetic Wound/Ulcer of the Lower Etiology: Extremity Wound Location: Right Toe Second Wound Status: Open Wounding Event: Gradually Appeared Date Acquired: 06/11/2016 Weeks Of Treatment: 47 Clustered Wound: No Pending Amputation On Presentation Photos Photo Uploaded By: Gretta Cool, BSN, RN, CWS, Kim on 08/20/2017 08:32:42 Wound Measurements Length: (cm) 0.8 Width: (cm) 2.2 Depth: (cm) 0.1 Area: (cm) 1.382 Volume: (cm) 0.138 % Reduction in Area: 55% % Reduction in Volume: 55% Wound Description Classification: Grade 2 Periwound Skin Texture Texture Color No Abnormalities Noted: No No Abnormalities Noted: No Moisture No Abnormalities Noted: No Treatment Notes Wound #2 (Right Toe Second) 1. Cleansed with: Clean wound with Normal Saline 4. Dressing Applied: Hydrafera [place] ([id number]) 5. Secondary Dressing Applied ABD Pad Notes kerlix and coban Electronic Signature(s) Signed: 08/20/2017 5:51:43 PM By: Gretta Cool, BSN, RN, CWS, Kim RN, BSN Entered By: Gretta Cool, BSN, RN, CWS, Kim on 08/19/2017 09:35:47 Boxx, [name]  ([id number]) -------------------------------------------------------------------------------- Wound Assessment Details Patient Name: Deroche, [name] E. Date of Service: 08/19/2017 9:15 AM Medical Record Number: [id number] Patient Account Number: [id number] Date of Birth/Sex: 11/01/1950 (67 y.o. M) Treating RN: Cornell Barman Primary Care Nura Cahoon: Lamonte Sakai Other Clinician: Referring Avondre Richens: Lamonte Sakai Treating Ulyses Panico/Extender: Melburn Hake, HOYT Weeks in Treatment: 88 Wound Status Wound Number: 5 Primary Diabetic Wound/Ulcer of the Lower Etiology: Extremity Wound Location: Left Toe Great Wound Status: Open Wounding Event: Gradually Appeared Date Acquired: 06/11/2016 Weeks Of Treatment: 47 Clustered Wound: No Pending Amputation On Presentation Photos Photo Uploaded By: Gretta Cool, BSN, RN, CWS, Kim on 08/20/2017 08:33:12 Wound Measurements Length: (cm) 1.5 Width: (cm) 1.5 Depth: (cm) 0.1 Area: (cm) 1.767 Volume: (cm) 0.177 % Reduction in Area: 87.6% % Reduction in Volume: 87.6% Wound Description Classification: Grade 2 Periwound Skin Texture Texture Color No Abnormalities Noted: No  No Abnormalities Noted: No Moisture No Abnormalities Noted: No Treatment Notes Wound #5 (Left Toe Great) 1. Cleansed with: Clean wound with Normal Saline 4. Dressing Applied: Hydrafera [place] ([id number]) 5. Secondary Dressing Applied ABD Pad Notes kerlix and coban Electronic Signature(s) Signed: 08/20/2017 5:51:43 PM By: Gretta Cool, BSN, RN, CWS, Kim RN, BSN Entered By: Gretta Cool, BSN, RN, CWS, Kim on 08/19/2017 09:35:47 Sousa, [name] ([id number]) -------------------------------------------------------------------------------- Wound Assessment Details Patient Name: Schnapp, Hawkins E. Date of Service: 08/19/2017 9:15 AM Medical Record Number: [id number] Patient Account Number: [id number] Date of Birth/Sex: 1950/04/18 (67 y.o. M) Treating RN: Cornell Barman Primary Care Kerston Landeck: Lamonte Sakai  Other Clinician: Referring Merissa Renwick: Lamonte Sakai Treating Vedder Brittian/Extender: Melburn Hake, HOYT Weeks in Treatment: 84 Wound Status Wound Number: 6 Primary Diabetic Wound/Ulcer of the Lower Etiology: Extremity Wound Location: Left Toe Second Wound Status: Open Wounding Event: Gradually Appeared Date Acquired: 06/11/2016 Weeks Of Treatment: 47 Clustered Wound: No Pending Amputation On Presentation Photos Photo Uploaded By: Gretta Cool, BSN, RN, CWS, Kim on 08/20/2017 08:33:12 Wound Measurements Length: (cm) 0.1 Width: (cm) 0.1 Depth: (cm) 0.1 Area: (cm) 0.008 Volume: (cm) 0.001 % Reduction in Area: 99.6% % Reduction in Volume: 99.5% Wound Description Classification: Grade 2 Periwound Skin Texture Texture Color No Abnormalities Noted: No No Abnormalities Noted: No Moisture No Abnormalities Noted: No Treatment Notes Wound #6 (Left Toe Second) 1. Cleansed with: Clean wound with Normal Saline 4. Dressing Applied: Hydrafera [place] ([id number]) 5. Secondary Dressing Applied ABD Pad Notes kerlix and coban Electronic Signature(s) Signed: 08/20/2017 5:51:43 PM By: Gretta Cool, BSN, RN, CWS, Kim RN, BSN Entered By: Gretta Cool, BSN, RN, CWS, Kim on 08/19/2017 09:35:48

## 2017-08-22 ENCOUNTER — Inpatient Hospital Stay: Payer: Medicare HMO

## 2017-08-22 ENCOUNTER — Inpatient Hospital Stay: Payer: Medicare HMO | Attending: Oncology

## 2017-08-22 ENCOUNTER — Encounter: Payer: Medicare HMO | Admitting: Nurse Practitioner

## 2017-08-22 VITALS — BP 158/86

## 2017-08-22 DIAGNOSIS — D631 Anemia in chronic kidney disease: Secondary | ICD-10-CM

## 2017-08-22 DIAGNOSIS — N189 Chronic kidney disease, unspecified: Secondary | ICD-10-CM | POA: Diagnosis present

## 2017-08-22 DIAGNOSIS — D509 Iron deficiency anemia, unspecified: Secondary | ICD-10-CM

## 2017-08-22 DIAGNOSIS — E11621 Type 2 diabetes mellitus with foot ulcer: Secondary | ICD-10-CM | POA: Diagnosis not present

## 2017-08-22 LAB — CBC WITH DIFFERENTIAL/PLATELET
BASOS PCT: 1 %
Basophils Absolute: 0.1 10*3/uL (ref 0–0.1)
EOS ABS: 0.4 10*3/uL (ref 0–0.7)
Eosinophils Relative: 6 %
HEMATOCRIT: 26.1 % — AB (ref 40.0–52.0)
HEMOGLOBIN: 9 g/dL — AB (ref 13.0–18.0)
Lymphocytes Relative: 15 %
Lymphs Abs: 1 10*3/uL (ref 1.0–3.6)
MCH: 30.4 pg (ref 26.0–34.0)
MCHC: 34.4 g/dL (ref 32.0–36.0)
MCV: 88.4 fL (ref 80.0–100.0)
MONO ABS: 0.8 10*3/uL (ref 0.2–1.0)
MONOS PCT: 13 %
NEUTROS ABS: 4.3 10*3/uL (ref 1.4–6.5)
NEUTROS PCT: 65 %
Platelets: 213 10*3/uL (ref 150–440)
RBC: 2.95 MIL/uL — ABNORMAL LOW (ref 4.40–5.90)
RDW: 14.5 % (ref 11.5–14.5)
WBC: 6.6 10*3/uL (ref 3.8–10.6)

## 2017-08-22 MED ORDER — DARBEPOETIN ALFA 100 MCG/0.5ML IJ SOSY
100.0000 ug | PREFILLED_SYRINGE | INTRAMUSCULAR | Status: AC
  Administered 2017-08-22: 100 ug via SUBCUTANEOUS
  Filled 2017-08-22: qty 0.5

## 2017-08-25 NOTE — Progress Notes (Signed)
GILLIAN, [name] ([id number]) Visit Report for 08/22/2017 Arrival Information Details Patient Name: [name], [name] Date of Service: 08/22/2017 12:30 PM Medical Record Number: [id number] Patient Account Number: [id number] Date of Birth/Sex: 1951-03-17 (67 y.o. M) Treating RN: Montey Hora Primary Care Kerrington Sova: Lamonte Sakai Other Clinician: Referring Rajni Holsworth: Lamonte Sakai Treating Fleta Borgeson/Extender: Cathie Olden in Treatment: 28 Visit Information History Since Last Visit Added or deleted any medications: No Patient Arrived: Ambulatory Any new allergies or adverse reactions: No Arrival Time: 12:32 Had a fall or experienced change in No Accompanied By: brother activities of daily living that may affect Transfer Assistance: None risk of falls: Patient Identification Verified: Yes Signs or symptoms of abuse/neglect since last visito No Secondary Verification Process Completed: Yes Hospitalized since last visit: No Patient Requires Transmission-Based No Implantable device outside of the clinic excluding No Precautions: cellular tissue based products placed in the center Patient Has Alerts: Yes since last visit: Patient Alerts: DMII Has Dressing in Place as Prescribed: Yes Has Compression in Place as Prescribed: Yes Pain Present Now: No Electronic Signature(s) Signed: 08/22/2017 4:44:42 PM By: Montey Hora Entered By: Montey Hora on 08/22/2017 12:36:16 Labell, [name] ([id number]) -------------------------------------------------------------------------------- Clinic Level of Care Assessment Details Patient Name: [name] Date of Service: 08/22/2017 12:30 PM Medical Record Number: [id number] Patient Account Number: [id number] Date of Birth/Sex: 01-12-1951 (67 y.o. M) Treating RN: Ahmed Prima Primary Care Trentan Trippe: Lamonte Sakai Other Clinician: Referring Jenette Rayson: Lamonte Sakai Treating Latoy Labriola/Extender: Cathie Olden in Treatment: 87 Clinic Level of  Care Assessment Items TOOL 4 Quantity Score X - Use when only an EandM is performed on FOLLOW-UP visit 1 0 ASSESSMENTS - Nursing Assessment / Reassessment X - Reassessment of Co-morbidities (includes updates in patient status) 1 10 X- 1 5 Reassessment of Adherence to Treatment Plan ASSESSMENTS - Wound and Skin Assessment / Reassessment []  - Simple Wound Assessment / Reassessment - one wound 0 X- 4 5 Complex Wound Assessment / Reassessment - multiple wounds []  - 0 Dermatologic / Skin Assessment (not related to wound area) ASSESSMENTS - Focused Assessment []  - Circumferential Edema Measurements - multi extremities 0 []  - 0 Nutritional Assessment / Counseling / Intervention []  - 0 Lower Extremity Assessment (monofilament, tuning fork, pulses) []  - 0 Peripheral Arterial Disease Assessment (using hand held doppler) ASSESSMENTS - Ostomy and/or Continence Assessment and Care []  - Incontinence Assessment and Management 0 []  - 0 Ostomy Care Assessment and Management (repouching, etc.) PROCESS - Coordination of Care X - Simple Patient / Family Education for ongoing care 1 15 []  - 0 Complex (extensive) Patient / Family Education for ongoing care []  - 0 Staff obtains Programmer, systems, Records, Test Results / Process Orders []  - 0 Staff telephones HHA, Nursing Homes / Clarify orders / etc []  - 0 Routine Transfer to another Facility (non-emergent condition) []  - 0 Routine Hospital Admission (non-emergent condition) []  - 0 New Admissions / Biomedical engineer / Ordering NPWT, Apligraf, etc. []  - 0 Emergency Hospital Admission (emergent condition) X- 1 10 Simple Discharge Coordination [name], [name] ([id number]) []  - 0 Complex (extensive) Discharge Coordination PROCESS - Special Needs []  - Pediatric / Minor Patient Management 0 []  - 0 Isolation Patient Management []  - 0 Hearing / Language / Visual special needs []  - 0 Assessment of Community assistance (transportation, D/C planning,  etc.) []  - 0 Additional assistance / Altered mentation []  - 0 Support Surface(s) Assessment (bed, cushion, seat, etc.) INTERVENTIONS - Wound Cleansing / Measurement []  - Simple Wound Cleansing - one  wound 0 X- 4 5 Complex Wound Cleansing - multiple wounds X- 1 5 Wound Imaging (photographs - any number of wounds) []  - 0 Wound Tracing (instead of photographs) []  - 0 Simple Wound Measurement - one wound X- 4 5 Complex Wound Measurement - multiple wounds INTERVENTIONS - Wound Dressings []  - Small Wound Dressing one or multiple wounds 0 X- 2 15 Medium Wound Dressing one or multiple wounds []  - 0 Large Wound Dressing one or multiple wounds X- 1 5 Application of Medications - topical []  - 0 Application of Medications - injection INTERVENTIONS - Miscellaneous []  - External ear exam 0 []  - 0 Specimen Collection (cultures, biopsies, blood, body fluids, etc.) []  - 0 Specimen(s) / Culture(s) sent or taken to Lab for analysis []  - 0 Patient Transfer (multiple staff / Civil Service fast streamer / Similar devices) []  - 0 Simple Staple / Suture removal (25 or less) []  - 0 Complex Staple / Suture removal (26 or more) []  - 0 Hypo / Hyperglycemic Management (close monitor of Blood Glucose) []  - 0 Ankle / Brachial Index (ABI) - do not check if billed separately X- 1 5 Vital Signs [name], [name] ([id number]) Has the patient been seen at the hospital within the last three years: Yes Total Score: 145 Level Of Care: New/Established - Level 4 Electronic Signature(s) Signed: 08/23/2017 4:38:26 PM By: Alric Quan Entered By: Alric Quan on 08/22/2017 12:54:44 Shapley, [name] ([id number]) -------------------------------------------------------------------------------- Encounter Discharge Information Details Patient Name: [name], [name] Date of Service: 08/22/2017 12:30 PM Medical Record Number: [id number] Patient Account Number: [id number] Date of Birth/Sex: [birth year] (67 y.o. M) Treating RN:  Roger Shelter Primary Care Merrissa Giacobbe: Lamonte Sakai Other Clinician: Referring Myelle Poteat: Lamonte Sakai Treating Latangela Mccomas/Extender: Cathie Olden in Treatment: 39 Encounter Discharge Information Items Discharge Condition: Stable Ambulatory Status: Ambulatory Discharge Destination: Home Transportation: Private Auto Schedule Follow-up Appointment: Yes Clinical Summary of Care: Electronic Signature(s) Signed: 08/22/2017 4:26:21 PM By: Roger Shelter Entered By: Roger Shelter on 08/22/2017 13:10:10 Bienkowski, [name] ([id number]) -------------------------------------------------------------------------------- Lower Extremity Assessment Details Patient Name: [name], [name] Date of Service: 08/22/2017 12:30 PM Medical Record Number: [id number] Patient Account Number: [id number] Date of Birth/Sex: December 10, 1950 (67 y.o. M) Treating RN: Montey Hora Primary Care Takayla Baillie: Lamonte Sakai Other Clinician: Referring Jayzen Paver: Lamonte Sakai Treating Zanaria Morell/Extender: Cathie Olden in Treatment: 47 Edema Assessment Assessed: [Left: No] [Right: No] [Left: Edema] [Right: :] Calf Left: Right: Point of Measurement: 34 cm From Medial Instep 35.6 cm 35.9 cm Ankle Left: Right: Point of Measurement: 12 cm From Medial Instep 23.8 cm 23.7 cm Vascular Assessment Pulses: Dorsalis Pedis Palpable: [Left:Yes] [Right:Yes] Posterior Tibial Extremity colors, hair growth, and conditions: Extremity Color: [Left:Hyperpigmented] [Right:Hyperpigmented] Hair Growth on Extremity: [Left:No] [Right:No] Temperature of Extremity: [Left:Warm] [Right:Warm] Capillary Refill: [Left:< 3 seconds] [Right:< 3 seconds] Toe Nail Assessment Left: Right: Thick: Yes Yes Discolored: Yes Yes Deformed: Yes Yes Improper Length and Hygiene: No No Electronic Signature(s) Signed: 08/22/2017 4:44:42 PM By: Montey Hora Entered By: Montey Hora on 08/22/2017 12:44:45 [name], [name]  ([id number]) -------------------------------------------------------------------------------- Multi Wound Chart Details Patient Name: [name], [name] Date of Service: 08/22/2017 12:30 PM Medical Record Number: [id number] Patient Account Number: [id number] Date of Birth/Sex: Jun 05, 1950 (67 y.o. M) Treating RN: Ahmed Prima Primary Care Irma Delancey: Lamonte Sakai Other Clinician: Referring Kessler Kopinski: Lamonte Sakai Treating Ashtyn Meland/Extender: Cathie Olden in Treatment: 58 Vital Signs Height(in): 69 Pulse(bpm): 55 Weight(lbs): 168 Blood Pressure(mmHg): 157/77 Body Mass Index(BMI): 25 Temperature(F): 98.4 Respiratory Rate 16 (breaths/min): Photos: [1:No Photos] [2:No Photos] [  5:No Photos] Wound Location: [1:Right Toe Great] [2:Right Toe Second] [5:Left Toe Great] Wounding Event: [1:Gradually Appeared] [2:Gradually Appeared] [5:Gradually Appeared] Primary Etiology: [1:Diabetic Wound/Ulcer of the Lower Extremity] [2:Diabetic Wound/Ulcer of the Lower Extremity] [5:Diabetic Wound/Ulcer of the Lower Extremity] Comorbid History: [1:Anemia, Lymphedema, Congestive Heart Failure, Hypertension, Peripheral Venous Disease, Type II Diabetes, Neuropathy] [2:Anemia, Lymphedema, Congestive Heart Failure, Hypertension, Peripheral Venous Disease, Type II Diabetes,  Neuropathy] [5:Anemia, Lymphedema, Congestive Heart Failure, Hypertension, Peripheral Venous Disease, Type II Diabetes, Neuropathy] Date Acquired: [1:06/11/2016] [2:06/11/2016] [5:06/11/2016] Weeks of Treatment: [1:47] [2:47] [5:47] Wound Status: [1:Open] [2:Open] [5:Open] Pending Amputation on [1:Yes] [2:Yes] [5:Yes] Presentation: Measurements L x W x D [1:1x0.6x0.1] [2:0.1x0.1x0.1] [5:0.3x0.2x0.1] (cm) Area (cm) : [1:0.471] [2:0.008] [5:0.047] Volume (cm) : [1:0.047] [2:0.001] [5:0.005] % Reduction in Area: [1:99.00%] [2:99.70%] [5:99.70%] % Reduction in Volume: [1:99.00%] [2:99.70%] [5:99.70%] Classification: [1:Grade 2] [2:Grade 2]  [5:Grade 2] Exudate Amount: [1:Medium] [2:None Present] [5:Large] Exudate Type: [1:Serosanguineous] [2:N/A] [5:Serous] Exudate Color: [1:red, brown] [2:N/A] [5:amber] Wound Margin: [1:Indistinct, nonvisible] [2:Flat and Intact] [5:Flat and Intact] Granulation Amount: [1:Large (67-100%)] [2:None Present (0%)] [5:Large (67-100%)] Granulation Quality: [1:Red] [2:N/A] [5:Red] Necrotic Amount: [1:Small (1-33%)] [2:None Present (0%)] [5:Small (1-33%)] Exposed Structures: [1:Fat Layer (Subcutaneous Tissue) Exposed: Yes Fascia: No Tendon: No Muscle: No Joint: No Bone: No] [2:Fascia: No Fat Layer (Subcutaneous Tissue) Exposed: No Tendon: No Muscle: No Joint: No Bone: No] [5:Fascia: No Fat Layer (Subcutaneous Tissue)  Exposed: No Tendon: No Muscle: No Joint: No Bone: No] Epithelialization: [1:Medium (34-66%)] [2:Large (67-100%)] [5:None] Periwound Skin Texture: Excoriation: No Excoriation: No Excoriation: No Induration: No Induration: No Induration: No Callus: No Callus: No Callus: No Crepitus: No Crepitus: No Crepitus: No Rash: No Rash: No Rash: No Scarring: No Scarring: No Scarring: No Periwound Skin Moisture: Maceration: No Maceration: No Maceration: No Dry/Scaly: No Dry/Scaly: No Dry/Scaly: No Periwound Skin Color: Atrophie Blanche: No Atrophie Blanche: No Atrophie Blanche: No Cyanosis: No Cyanosis: No Cyanosis: No Ecchymosis: No Ecchymosis: No Ecchymosis: No Erythema: No Erythema: No Erythema: No Hemosiderin Staining: No Hemosiderin Staining: No Hemosiderin Staining: No Mottled: No Mottled: No Mottled: No Pallor: No Pallor: No Pallor: No Rubor: No Rubor: No Rubor: No Temperature: N/A No Abnormality No Abnormality Tenderness on Palpation: No No No Wound Preparation: Ulcer Cleansing: Ulcer Cleansing: Ulcer Cleansing: Rinsed/Irrigated with Saline Rinsed/Irrigated with Saline Rinsed/Irrigated with Saline Topical Anesthetic Applied: Topical Anesthetic  Applied: Topical Anesthetic Applied: Other: lidocaine 4% None Other: lidocaine 4% Wound Number: 6 N/A N/A Photos: No Photos N/A N/A Wound Location: Left Toe Second N/A N/A Wounding Event: Gradually Appeared N/A N/A Primary Etiology: Diabetic Wound/Ulcer of the N/A N/A Lower Extremity Comorbid History: Anemia, Lymphedema, N/A N/A Congestive Heart Failure, Hypertension, Peripheral Venous Disease, Type II Diabetes, Neuropathy Date Acquired: 06/11/2016 N/A N/A Weeks of Treatment: 47 N/A N/A Wound Status: Open N/A N/A Pending Amputation on Yes N/A N/A Presentation: Measurements L x W x D 0.3x0.3x0.1 N/A N/A (cm) Area (cm) : 0.071 N/A N/A Volume (cm) : 0.007 N/A N/A % Reduction in Area: 96.70% N/A N/A % Reduction in Volume: 96.70% N/A N/A Classification: Grade 2 N/A N/A Exudate Amount: None Present N/A N/A Exudate Type: N/A N/A N/A Exudate Color: N/A N/A N/A Wound Margin: Flat and Intact N/A N/A Granulation Amount: Large (67-100%) N/A N/A Granulation Quality: Red N/A N/A Necrotic Amount: None Present (0%) N/A N/A Exposed Structures: Fascia: No N/A N/A Fat Layer (Subcutaneous Tissue) Exposed: No Tendon: No [name], [name] ([id number]) Muscle: No Joint: No Bone: No Epithelialization: Large (67-100%) N/A N/A Periwound Skin  Texture: Excoriation: No N/A N/A Induration: No Callus: No Crepitus: No Rash: No Scarring: No Periwound Skin Moisture: Maceration: No N/A N/A Dry/Scaly: No Periwound Skin Color: Atrophie Blanche: No N/A N/A Cyanosis: No Ecchymosis: No Erythema: No Hemosiderin Staining: No Mottled: No Pallor: No Rubor: No Temperature: No Abnormality N/A N/A Tenderness on Palpation: No N/A N/A Wound Preparation: Ulcer Cleansing: N/A N/A Rinsed/Irrigated with Saline Topical Anesthetic Applied: Other: lidocaine 4% Treatment Notes Electronic Signature(s) Signed: 08/22/2017 12:59:36 PM By: Lawanda Cousins Entered By: Lawanda Cousins on 08/22/2017 12:59:36 [name],  [name] ([id number]) -------------------------------------------------------------------------------- [place] Details Patient Name: [name], [name] Date of Service: 08/22/2017 12:30 PM Medical Record Number: [id number] Patient Account Number: [id number] Date of Birth/Sex: 11-18-1950 (67 y.o. M) Treating RN: Ahmed Prima Primary Care Daqwan Dougal: Lamonte Sakai Other Clinician: Referring Calin Fantroy: Lamonte Sakai Treating Nissim Fleischer/Extender: Cathie Olden in Treatment: 57 Active Inactive ` Abuse / Safety / Falls / Self Care Management Nursing Diagnoses: Potential for falls Goals: Patient will remain injury free related to falls Date Initiated: 09/21/2016 Target Resolution Date: 09/14/2017 Goal Status: Active Interventions: Assess fall risk on admission and as needed Notes: ` Nutrition Nursing Diagnoses: Potential for alteratiion in Nutrition/Potential for imbalanced nutrition Goals: Patient/caregiver agrees to and verbalizes understanding of need to use nutritional supplements and/or vitamins as prescribed Date Initiated: 09/21/2016 Target Resolution Date: 09/14/2017 Goal Status: Active Interventions: Assess patient nutrition upon admission and as needed per policy Notes: ` Orientation to the Wound Care Program Nursing Diagnoses: Knowledge deficit related to the wound healing center program Goals: Patient/caregiver will verbalize understanding of the [place] [facility] Date Initiated: 09/21/2016 Target Resolution Date: 06/15/2017 Goal Status: Active Interventions: DARKGavinn, Collard ([id number]) Provide education on orientation to the wound center Notes: ` Wound/Skin Impairment Nursing Diagnoses: Knowledge deficit related to smoking impact on wound healing Goals: Ulcer/skin breakdown will have a volume reduction of 30% by week 4 Date Initiated: 09/21/2016 Target Resolution Date: 08/17/2017 Goal Status: Active Ulcer/skin breakdown will have a  volume reduction of 50% by week 8 Date Initiated: 09/21/2016 Target Resolution Date: 08/17/2017 Goal Status: Active Ulcer/skin breakdown will have a volume reduction of 80% by week 12 Date Initiated: 09/21/2016 Target Resolution Date: 09/14/2017 Goal Status: Active Ulcer/skin breakdown will heal within 14 weeks Date Initiated: 09/21/2016 Target Resolution Date: 08/17/2017 Goal Status: Active Interventions: Assess patient/caregiver ability to obtain necessary supplies Assess patient/caregiver ability to perform ulcer/skin care regimen upon admission and as needed Assess ulceration(s) every visit Notes: Electronic Signature(s) Signed: 08/23/2017 4:38:26 PM By: Alric Quan Entered By: Alric Quan on 08/22/2017 12:48:13 Roussin, [name] ([id number]) -------------------------------------------------------------------------------- Pain Assessment Details Patient Name: [name], [name] Date of Service: 08/22/2017 12:30 PM Medical Record Number: [id number] Patient Account Number: [id number] Date of Birth/Sex: [birth year] (67 y.o. M) Treating RN: Montey Hora Primary Care Blessing Zaucha: Lamonte Sakai Other Clinician: Referring Galit Urich: Lamonte Sakai Treating Daeshon Grammatico/Extender: Cathie Olden in Treatment: 31 Active Problems Location of Pain Severity and Description of Pain Patient Has Paino No Site Locations Pain Management and Medication Current Pain Management: Electronic Signature(s) Signed: 08/22/2017 4:44:42 PM By: Montey Hora Entered By: Montey Hora on 08/22/2017 12:36:46 [name], [name] ([id number]) -------------------------------------------------------------------------------- Patient/Caregiver Education Details Patient Name: Caffey, Abem E. Date of Service: 08/22/2017 12:30 PM Medical Record Number: [id number] Patient Account Number: [id number] Date of Birth/Gender: Dec 31, 1950 (67 y.o. M) Treating RN: Roger Shelter Primary Care Physician: Lamonte Sakai Other  Clinician: Referring Physician: Lamonte Sakai Treating Physician/Extender: Cathie Olden in Treatment: 15 Education Assessment Education Provided To:  Patient Education Topics Provided Wound Debridement: Handouts: Wound Debridement Methods: Explain/Verbal Responses: State content correctly Wound/Skin Impairment: Handouts: Caring for Your Ulcer Methods: Explain/Verbal Responses: State content correctly Electronic Signature(s) Signed: 08/22/2017 4:26:21 PM By: Roger Shelter Entered By: Roger Shelter on 08/22/2017 13:10:26 Picker, [name] ([id number]) -------------------------------------------------------------------------------- Wound Assessment Details Patient Name: Charters, Vinicio E. Date of Service: 08/22/2017 12:30 PM Medical Record Number: [id number] Patient Account Number: [id number] Date of Birth/Sex: 05-25-1950 (67 y.o. M) Treating RN: Montey Hora Primary Care Zyiah Withington: Lamonte Sakai Other Clinician: Referring Verenis Nicosia: Lamonte Sakai Treating Jaydi Bray/Extender: Cathie Olden in Treatment: 47 Wound Status Wound Number: 1 Primary Diabetic Wound/Ulcer of the Lower Extremity Etiology: Wound Location: Right Toe Great Wound Open Wounding Event: Gradually Appeared Status: Date Acquired: 06/11/2016 Comorbid Anemia, Lymphedema, Congestive Heart Weeks Of Treatment: 47 History: Failure, Hypertension, Peripheral Venous Clustered Wound: No Disease, Type II Diabetes, Neuropathy Pending Amputation On Presentation Photos Photo Uploaded By: Montey Hora on 08/22/2017 16:11:09 Wound Measurements Length: (cm) 1 Width: (cm) 0.6 Depth: (cm) 0.1 Area: (cm) 0.471 Volume: (cm) 0.047 % Reduction in Area: 99% % Reduction in Volume: 99% Epithelialization: Medium (34-66%) Tunneling: No Undermining: No Wound Description Classification: Grade 2 Wound Margin: Indistinct, nonvisible Exudate Amount: Medium Exudate Type: Serosanguineous Exudate Color: red, brown Foul  Odor After Cleansing: No Slough/Fibrino Yes Wound Bed Granulation Amount: Large (67-100%) Exposed Structure Granulation Quality: Red Fascia Exposed: No Necrotic Amount: Small (1-33%) Fat Layer (Subcutaneous Tissue) Exposed: Yes Necrotic Quality: Adherent Slough Tendon Exposed: No Muscle Exposed: No Joint Exposed: No Bone Exposed: No Periwound Skin Texture Bugge, Cutberto E. ([id number]) Texture Color No Abnormalities Noted: No No Abnormalities Noted: No Callus: No Atrophie Blanche: No Crepitus: No Cyanosis: No Excoriation: No Ecchymosis: No Induration: No Erythema: No Rash: No Hemosiderin Staining: No Scarring: No Mottled: No Pallor: No Moisture Rubor: No No Abnormalities Noted: No Dry / Scaly: No Maceration: No Wound Preparation Ulcer Cleansing: Rinsed/Irrigated with Saline Topical Anesthetic Applied: Other: lidocaine 4%, Treatment Notes Wound #1 (Right Toe Great) 1. Cleansed with: Clean wound with Normal Saline 2. Anesthetic Topical Lidocaine 4% cream to wound bed prior to debridement 4. Dressing Applied: Hydrafera Blue 5. Secondary Dressing Applied Dry Gauze Notes silver cell rope weave in between toes on both feet , cover with gauze and wrap with coban/kerlix and tape with darko shoes bilaTERAL Electronic Signature(s) Signed: 08/22/2017 4:44:42 PM By: Montey Hora Entered By: Montey Hora on 08/22/2017 12:39:59 Mckey, [name] ([id number]) -------------------------------------------------------------------------------- Wound Assessment Details Patient Name: Zullo, Ashanti E. Date of Service: 08/22/2017 12:30 PM Medical Record Number: [id number] Patient Account Number: [id number] Date of Birth/Sex: 1950-07-13 (67 y.o. M) Treating RN: Montey Hora Primary Care Sugey Trevathan: Lamonte Sakai Other Clinician: Referring Miqueas Whilden: Lamonte Sakai Treating Tarquin Welcher/Extender: Cathie Olden in Treatment: 47 Wound Status Wound Number: 2 Primary Diabetic  Wound/Ulcer of the Lower Extremity Etiology: Wound Location: Right Toe Second Wound Open Wounding Event: Gradually Appeared Status: Date Acquired: 06/11/2016 Comorbid Anemia, Lymphedema, Congestive Heart Weeks Of Treatment: 47 History: Failure, Hypertension, Peripheral Venous Clustered Wound: No Disease, Type II Diabetes, Neuropathy Pending Amputation On Presentation Photos Photo Uploaded By: Montey Hora on 08/22/2017 16:11:10 Wound Measurements Length: (cm) 0.1 Width: (cm) 0.1 Depth: (cm) 0.1 Area: (cm) 0.008 Volume: (cm) 0.001 % Reduction in Area: 99.7% % Reduction in Volume: 99.7% Epithelialization: Large (67-100%) Tunneling: No Undermining: No Wound Description Classification: Grade 2 Wound Margin: Flat and Intact Exudate Amount: None Present Foul Odor After Cleansing: No Slough/Fibrino No Wound Bed Granulation Amount: None Present (0%) Exposed Structure  Necrotic Amount: None Present (0%) Fascia Exposed: No Fat Layer (Subcutaneous Tissue) Exposed: No Tendon Exposed: No Muscle Exposed: No Joint Exposed: No Bone Exposed: No Periwound Skin Texture Texture Color Guandique, Keoni E. ([id number]) No Abnormalities Noted: No No Abnormalities Noted: No Callus: No Atrophie Blanche: No Crepitus: No Cyanosis: No Excoriation: No Ecchymosis: No Induration: No Erythema: No Rash: No Hemosiderin Staining: No Scarring: No Mottled: No Pallor: No Moisture Rubor: No No Abnormalities Noted: No Dry / Scaly: No Temperature / Pain Maceration: No Temperature: No Abnormality Wound Preparation Ulcer Cleansing: Rinsed/Irrigated with Saline Topical Anesthetic Applied: None Treatment Notes Wound #2 (Right Toe Second) 1. Cleansed with: Clean wound with Normal Saline 2. Anesthetic Topical Lidocaine 4% cream to wound bed prior to debridement 4. Dressing Applied: Hydrafera Blue 5. Secondary Dressing Applied Dry Gauze Notes silver cell rope weave in between toes on both  feet , cover with gauze and wrap with coban/kerlix and tape with darko shoes bilaTERAL Electronic Signature(s) Signed: 08/22/2017 4:44:42 PM By: Montey Hora Entered By: Montey Hora on 08/22/2017 12:40:56 Picker, [name] ([id number]) -------------------------------------------------------------------------------- Wound Assessment Details Patient Name: Zerbe, Neftali E. Date of Service: 08/22/2017 12:30 PM Medical Record Number: [id number] Patient Account Number: [id number] Date of Birth/Sex: Jul 23, 1950 (67 y.o. M) Treating RN: Montey Hora Primary Care Byrd Terrero: Lamonte Sakai Other Clinician: Referring Aziza Stuckert: Lamonte Sakai Treating Athalene Kolle/Extender: Cathie Olden in Treatment: 47 Wound Status Wound Number: 5 Primary Diabetic Wound/Ulcer of the Lower Extremity Etiology: Wound Location: Left Toe Great Wound Open Wounding Event: Gradually Appeared Status: Date Acquired: 06/11/2016 Comorbid Anemia, Lymphedema, Congestive Heart Weeks Of Treatment: 47 History: Failure, Hypertension, Peripheral Venous Clustered Wound: No Disease, Type II Diabetes, Neuropathy Pending Amputation On Presentation Photos Photo Uploaded By: Montey Hora on 08/22/2017 16:11:41 Wound Measurements Length: (cm) 0.3 Width: (cm) 0.2 Depth: (cm) 0.1 Area: (cm) 0.047 Volume: (cm) 0.005 % Reduction in Area: 99.7% % Reduction in Volume: 99.7% Epithelialization: None Tunneling: No Undermining: No Wound Description Classification: Grade 2 Wound Margin: Flat and Intact Exudate Amount: Large Exudate Type: Serous Exudate Color: amber Foul Odor After Cleansing: No Slough/Fibrino Yes Wound Bed Granulation Amount: Large (67-100%) Exposed Structure Granulation Quality: Red Fascia Exposed: No Necrotic Amount: Small (1-33%) Fat Layer (Subcutaneous Tissue) Exposed: No Necrotic Quality: Adherent Slough Tendon Exposed: No Muscle Exposed: No Joint Exposed: No Bone Exposed: No Periwound Skin  Texture Michon, Satchel E. ([id number]) Texture Color No Abnormalities Noted: No No Abnormalities Noted: No Callus: No Atrophie Blanche: No Crepitus: No Cyanosis: No Excoriation: No Ecchymosis: No Induration: No Erythema: No Rash: No Hemosiderin Staining: No Scarring: No Mottled: No Pallor: No Moisture Rubor: No No Abnormalities Noted: No Dry / Scaly: No Temperature / Pain Maceration: No Temperature: No Abnormality Wound Preparation Ulcer Cleansing: Rinsed/Irrigated with Saline Topical Anesthetic Applied: Other: lidocaine 4%, Treatment Notes Wound #5 (Left Toe Great) 1. Cleansed with: Clean wound with Normal Saline 2. Anesthetic Topical Lidocaine 4% cream to wound bed prior to debridement 4. Dressing Applied: Hydrafera Blue 5. Secondary Dressing Applied Dry Gauze Notes silver cell rope weave in between toes on both feet , cover with gauze and wrap with coban/kerlix and tape with darko shoes bilaTERAL Electronic Signature(s) Signed: 08/22/2017 4:44:42 PM By: Montey Hora Entered By: Montey Hora on 08/22/2017 12:41:57 Altmann, [name] ([id number]) -------------------------------------------------------------------------------- Wound Assessment Details Patient Name: Hirano, Zadin E. Date of Service: 08/22/2017 12:30 PM Medical Record Number: [id number] Patient Account Number: [id number] Date of Birth/Sex: 05/16/1950 (67 y.o. M) Treating RN: Montey Hora Primary Care Annisten [place]: Lamonte Sakai  Other Clinician: Referring Neetu Carrozza: Lamonte Sakai Treating Juanito Gonyer/Extender: Cathie Olden in Treatment: 47 Wound Status Wound Number: 6 Primary Diabetic Wound/Ulcer of the Lower Extremity Etiology: Wound Location: Left Toe Second Wound Open Wounding Event: Gradually Appeared Status: Date Acquired: 06/11/2016 Comorbid Anemia, Lymphedema, Congestive Heart Weeks Of Treatment: 47 History: Failure, Hypertension, Peripheral Venous Clustered Wound: No Disease, Type II  Diabetes, Neuropathy Pending Amputation On Presentation Photos Photo Uploaded By: Montey Hora on 08/22/2017 16:11:42 Wound Measurements Length: (cm) 0.3 Width: (cm) 0.3 Depth: (cm) 0.1 Area: (cm) 0.071 Volume: (cm) 0.007 % Reduction in Area: 96.7% % Reduction in Volume: 96.7% Epithelialization: Large (67-100%) Tunneling: No Undermining: No Wound Description Classification: Grade 2 Wound Margin: Flat and Intact Exudate Amount: None Present Foul Odor After Cleansing: No Slough/Fibrino No Wound Bed Granulation Amount: Large (67-100%) Exposed Structure Granulation Quality: Red Fascia Exposed: No Necrotic Amount: None Present (0%) Fat Layer (Subcutaneous Tissue) Exposed: No Tendon Exposed: No Muscle Exposed: No Joint Exposed: No Bone Exposed: No Periwound Skin Texture Texture Color Cazarez, Treyvin E. ([id number]) No Abnormalities Noted: No No Abnormalities Noted: No Callus: No Atrophie Blanche: No Crepitus: No Cyanosis: No Excoriation: No Ecchymosis: No Induration: No Erythema: No Rash: No Hemosiderin Staining: No Scarring: No Mottled: No Pallor: No Moisture Rubor: No No Abnormalities Noted: No Dry / Scaly: No Temperature / Pain Maceration: No Temperature: No Abnormality Wound Preparation Ulcer Cleansing: Rinsed/Irrigated with Saline Topical Anesthetic Applied: Other: lidocaine 4%, Treatment Notes Wound #6 (Left Toe Second) 1. Cleansed with: Clean wound with Normal Saline 2. Anesthetic Topical Lidocaine 4% cream to wound bed prior to debridement 4. Dressing Applied: Hydrafera Blue 5. Secondary Dressing Applied Dry Gauze Notes silver cell rope weave in between toes on both feet , cover with gauze and wrap with coban/kerlix and tape with darko shoes bilaTERAL Electronic Signature(s) Signed: 08/22/2017 4:44:42 PM By: Montey Hora Entered By: Montey Hora on 08/22/2017 12:43:01 Losh, [name]  ([id number]) -------------------------------------------------------------------------------- [facility] Details Patient Name: Holzworth, Jakylan E. Date of Service: 08/22/2017 12:30 PM Medical Record Number: [id number] Patient Account Number: [id number] Date of Birth/Sex: 1950/10/24 (67 y.o. M) Treating RN: Montey Hora Primary Care Camelle Henkels: Lamonte Sakai Other Clinician: Referring Tonny Isensee: Lamonte Sakai Treating Jerrilynn Mikowski/Extender: Cathie Olden in Treatment: 70 Vital Signs Time Taken: 12:36 Temperature (F): 98.4 Height (in): 69 Pulse (bpm): 55 Weight (lbs): 168 Respiratory Rate (breaths/min): 16 Body Mass Index (BMI): 24.8 Blood Pressure (mmHg): 157/77 Reference Range: 80 - 120 mg / dl Electronic Signature(s) Signed: 08/22/2017 4:44:42 PM By: Montey Hora Entered By: Montey Hora on 08/22/2017 12:38:42

## 2017-08-25 NOTE — Progress Notes (Signed)
SUN, WILENSKY ([id number]) Visit Report for 08/22/2017 Chief Complaint Document Details Patient Name: [name], [name] Date of Service: 08/22/2017 12:30 PM Medical Record Number: [id number] Patient Account Number: [id number] Date of Birth/Sex: May 16, 1950 (67 y.o. M) Treating RN: Ahmed Prima Primary Care Provider: Lamonte Sakai Other Clinician: Referring Provider: Lamonte Sakai Treating Provider/Extender: Cathie Olden in Treatment: 96 Information Obtained from: Patient Chief Complaint Patient presents for treatment of an open diabetic ulcer to both feet Electronic Signature(s) Signed: 08/22/2017 1:00:43 PM By: Lawanda Cousins Entered By: Lawanda Cousins on 08/22/2017 13:00:43 Dayley, [name] ([id number]) -------------------------------------------------------------------------------- HPI Details Patient Name: Poblete, Trayven E. Date of Service: 08/22/2017 12:30 PM Medical Record Number: [id number] Patient Account Number: [id number] Date of Birth/Sex: [birth year] (67 y.o. M) Treating RN: Ahmed Prima Primary Care Provider: Lamonte Sakai Other Clinician: Referring Provider: Lamonte Sakai Treating Provider/Extender: Cathie Olden in Treatment: 30 History of Present Illness Location: bilateral feet ulceration on the toes Quality: Patient reports experiencing a dull pain to affected area(s). Severity: Patient states wound are getting better Duration: Patient has had the wound for > 3 months prior to seeking treatment at the wound center Timing: Pain in wound is constant (hurts all the time) Context: The wound would happen gradually Modifying Factors: Other treatment(s) tried include:treatment for lymphedema and is seen by the podiatrist Dr. Caryl Comes Associated Signs and Symptoms: Patient reports having increase swelling. HPI Description: 67 year old patient here to see as for bilateral feet ulceration to on his left first and second toe and 2 on his right first and second toe, which  she's had for about 4 months. 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 Donella Stade. The patient is also undergoing workup by medical oncology for a intra-abdominal lymphadenopathy. 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 He is also status post appendectomy, inguinal lymph node biopsy, rectal biopsy and rectal examination under anesthesia. he currently smokes cigarettes about half packet a day. In March of this year he was seen by Dr. Hortencia Pilar, for evaluation of bilateral varicose veins and besides wearing compression stockings he had recommended laser ablation of the right and left great saphenous veins to eleviate the symptoms and complications of severe superficial venous reflux disease. He also recommended lymphedema pumps for better control of his lymphedema. The patient recently has had on 08/23/2016, right greater saphenous vein ablation with the laser energy Earlier lower extremity venous reflux examination done on 05/08/2016 showed no DVT or SVT both lower legs but incompetence of bilateral great saphenous veins was present. A lower arterial study was also done and there was no significant right lower and left lower extremity problems based on a normal toe brachial index bilaterally and the ABI was 1.21 the left and 1.23 on the right. His post ablation venous duplex examination showed successful ablation of the right GS vein with thrombus formation 2 below the right saphenofemoral junction. The deep system was patent without evidence of thrombosis and this was done on 08/30/2016. the patient also has a squamous cell cancer of the skin of the buttock and is recently undergone radiation therapy for this prior to excisional  surgery. Addendum: regarding his x-rays done today and x-ray of the left foot -- IMPRESSION: No objective evidence of osteomyelitis. There are soft tissue changes which  may reflect cellulitis. X-ray of the right foot -- IMPRESSION:Findings compatible with cellulitis of the toes. No objective evidence of osteomyelitis is observed. 10/01/16 on evaluation today patient's wounds appeared to be doing some better. I did review the x-rays as well which showed no evidence of osteomyelitis although there was evidence on x-ray of cellulitis. He fortunately is not having any discomfort although he continues to have some swelling. He does not remember being on any antibiotics recently. 10/15/16 on evaluation today patient's wounds overall appear to be doing better although he does have a new location noted on the left foot. Fortunately he is not having significant pain. It almost has the appearance that something is rubbing on the end of his toes but he wears the open toe shoes and according to what he is telling me never wears anything that would rub on his foot. There is no evidence of infection and specifically no evidence of a fungal infection 10/22/16 On evaluation today patient's wounds appeared to be doing better compared to last week in regard to his bilateral [name], [name] ([id number]) lower extremities. Fortunately I happy with how things are progressing although he still has ulcers I feel like that he is improving and appropriate manner. 11/12/16 on evaluation today patient appears to be doing well in regard to his bilateral feet and the respective wounds. We have been using surrounding her dressings along with an antifungal cream which seems to be doing very well. He has no bilateral dysfunction noticed that the rituals are weight loss at this point. He also has no nausea or vomiting a note purulent discharge. He did see Vein and vascular today and he tells me that they told him he could have surgery  for his venous stasis but they did not feel like it was worth it in his words. Fortunately patient's wounds do appear to be getting sneakily better. 11/26/2016 -- he says he is going to have some surgery during this week at [facility] for possibly a colon resection. 12/31/2016 -- the patient has been noncompliant with his smoking and I'm not sure whether he is also started drinking again. He continues to be very nonchalant about his care 01/14/2017 -- the patient's HandP has been reviewed well and I understand he is being compliant with trying to give up smoking and his local dressing changes. He does not have any surgical options of 4 to him by his vascular surgeons.he was last seen in early August by Dr. Hortencia Pilar who recommended compression stockings,and possibly lymph pumps in 2-3 months after doing a review ultrasound. 01/28/2017 - the patient did not have any fresh complaints but on examination I noted a large lacerated wound on the plantar aspect of his right fourth toe which had a lot of necrotic debris and it probes down to bone. 02/07/2017 -- x-ray of the right foot -- IMPRESSION: Soft tissue swelling about the first through fourth toes consistent with cellulitis. New destructive change in the tuft of the distal phalanx of the great toe is consistent with osteomyelitis. 02/14/2017 -- the patient's MRI is pending this coming Monday and he still continues to smoke. We have again gone over off loading of his wounds in great detail and he says he's been compliant. 02/21/2017 -- MR of the right foot -- IMPRESSION: 1. Soft tissue ulcer at the tip of the first, second and third toe knows. Cortical irregularity and bone marrow edema in the first distal phalanx most concerning for osteomyelitis. Mild marrow edema  in the second and third distal phalanx without definite cortical destruction which may reflect early osteomyelitis versus reactive marrow edema. 2. Soft tissue edema surrounding  the first phalanx most consistent with cellulitis. the patient was also recently evaluated by his medical oncologist Dr. 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 She is treating him with weekly Procrit. She is also keeping intra-abdominal lymphadenopathy and right lower lobe lung nodule under observation. 04/04/2017 -- he was seen by Dr. Adrian Prows on 03/25/2017 -- after review he empirically put him on ciprofloxacin and doxycycline as they have good bone penetration and good bioavailability and it will cover the usual pathogens and diabetic foot osteomyelitis. He will check inflammatory markers and plan a 74-64 week old records. C-reactive protein was 0.3 and the ESR was 72 04/18/17 on evaluation today patient appears to be doing about the same in regard to his lower extremity wounds bilaterally. He has continued to use the antifungal cream which does seem to be beneficial. Nonetheless the ulcers do seem to in some areas be epithelial eyes over and in other areas are still open. He is having no significant discomfort. 04/25/17-he is here in follow-up evaluation for multiple ulcerations to multiple toes bilaterally. He states he did see Dr. Ola Spurr again last week and continues antibiotic therapy. He is voicing no complaints or concerns, will continue with current treatment plan will possibility of adding compression therapy next week after an additional week of treatment/lotions to BLE prescribed by Dr Ola Spurr 05/02/17 he is here in follow up for for multiple ulcers to multiple toes bilaterally. we will stop using antifungal cream and will continue with silvercel and follow up next week 05/09/17-he is here in follow-up for multiple ulcerations to multiple toes bilaterally. There is improvement in appearance. He has not completely stopped using antifungal cream, but admits he has not using it between the toes. He has an  appointment with Dr. Ola Spurr on 2/11, continues on doxycycline and Cipro. It has been 5 weeks of antibiotic therapy, we will order plain film xray to evaluate for osteomyelitis next week, prior to follow up with ID. Will continue with silvercel and follow up next week 05/16/17-he is here in follow-up evaluation for multiple ulcerations to multiple toes bilaterally and new wound to the right posterior heel. There is essentially no change in appearance, deteriorating measurements; he has a history of waxing and waning measurements. He admits that he continues to apply moisturizer/cream/ointment to his toes despite weekly reminders to only apply silvercel to his toes. He states that he thinks the surgical shoe contributed to the superficial ulcer to his posterior Brackney, Dontre E. ([id number]) heel, he is unable to articulate if this was an area of dry cracked skin as he has a similar area to the left heel. He now is wearing open toed slippers. He has an appointment with Dr. Ola Spurr on 2/11. We have ordered x-rays for her bilateral feet; he was advised to obtain the x-rays today or tomorrow. He will follow-up next week 05/23/17-he is here in follow-up evaluation for multiple ulcerations to multiple toes bilaterally and the right posterior heel. There is improvement in maceration. He has been compliant and not applying any moisturizing agent to his toes. He has been using Lac-Hydrin for his lower extremities with improvement. He did not go to his appointment on Monday with Dr. Ola Spurr secondary to financial concerns. X-rays for her bilateral feet showed: LEFT FOOT with slight  erosion of the tuft of the distal phalanges of the left first and second toe suspicious for osteomyelitis, RIGHT FOOT with 1.erosion of the tufts of the distal phalanges of the right first second and possibly third toes consistent with osteomyelitis, 2 no definitive abnormality of the calcaneus is seen on the images obtained, 3.  Plantar calcaneal degenerative spur. We briefly discussed hyperbaric adjunctive therapy for treatment of chronic refractory osteomyelitis. I do not find an a1c in EMR, will contact PCP for record, or order if needed. He has been encouraged to contact Dr Ola Spurr office regarding the follow-up appointment, encouraged him to inquire about payment plan. We will continue with same treatment plan and follow-up next week. He states he is still taking antibiotics and has "a lot" left. He states he has been taking them as directed, 2 pills twice daily. According to Dr. Blane Ohara office notes he was originally started on 12/17 for 4 weeks and extended on 1/14 for an additional 4 weeks. He should be done with his antibiotic therapy, he was advised to bring his bottles and to his next appointment, we will contact pharmacy. 05/30/17-he is here in follow-up evaluation for multiple ulcerations to multiple toes bilaterally and the right posterior heel. He is accompanied by his brother-in-law. Wounds are stable. He has yet to make up with Dr. Ola Spurr. We contacted his PCP, with no record of recent A1c we will draw an A1c. His brother-in-law states that he was taken off all of his diabetic medication secondary to kidney function. 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 The antibiotics were counted and have approximately 2 weeks left, although they should be complete. The pill organizer reveals missing days. We discussed the need for consistent medications, to have optimal benefit of medication. He has a cousin that lives with him and he will ask her to check his organizer daily. He has been advised to follow up with Dr Ola Spurr, and will go by the office today. He has been advised to quit smoking. 06/06/17-he is here in follow up evaluation. He has had to make an appointment with Dr. Ola Spurr. He did have  blood work obtained, a1c 5. He continues to take antibiotic therapy. Significant improvement in bilateral lower extremity edema with compression therapy. Essentially no change in ulcerations to toes. He states he is "going to try something different" and "let me know next week" if it works; he would not provide any additional information and was encouraged to follow our orders. We will follow up next week 06/13/17-he is here in follow-up evaluation. He has an appointment with Dr. Ola Spurr tomorrow morning. He states he purchased an ointment from [facility] and applied to his toes for 3 days, he does not remember the name of the ointment. There is improvement to his wounds, minimal maceration. He continues to take antibiotic therapy, this should have been completed last month. His brother-in-law who regularly accompanies his appointments was asked to take the bottles to the appointment tomorrow with Dr. Ola Spurr so he is aware. We will continue with 3 layer compression, and order OPEN TOE compression 20-30mmHg; we will apply compression stockings next week. He continues to smoke, smoked "2 cigarettes" last week 06/20/17 on evaluation today patient did receive his compression stockings which he has with him today for both lower extremities. With that being said he tells me at this point in time that he is very happy to have these he  really is not a big fan of the compression wraps that we have been utilizing although they have been of great benefit for him. Nonetheless at this point he does want to switch to the compression stockings. In my opinion as long as he is continuing with compression I'm okay with the stockings or the wraps. 06/27/17-he is here in follow-up evaluation for multiple ulcerations to his bilateral toes. There is some improvement in appearance. He is compliant in wearing his compression stockings with significant improvement in lower extremity edema. He saw Dr Ola Spurr on 3/8,  per his notes they would redraw ESR and CRP; plan to continue antibiotic therapy if these remain elevated. I do not see an ESR or CRP level in Epic. The patient continues to take antibiotics. 07/11/17-He is here in follow-up evaluation for multiple ulcerations to multiple toes bilaterally. He presents with complete epithelialization to the right third toe; there has been no deterioration. He continues on antibiotic therapy. He will follow-up next week 07/18/17-He is here in follow-up evaluation for multiple ulcerations to multiple toes bilaterally. He continues to make improvement. He continues on antibiotic therapy. He states he has been using something additional to our orders, he does not elaborate but states he will bring it in next week. 07/25/17-He is here in follow up evaluation for multiple ulcerations to bilateral toes. He is stable. He has completed antibiotic therapy. He admits to "filing" his toes after showers each evening, this is what he was referencing last week; he does not filing for the wounds. We will switch to [facility] and monitor for any improvement, he will follow-up next week 08/01/17-He is here in follow-up evaluation. He admits to "picking" at his toes after cleansing yesterday, leading to new areas of tissue loss on the bilateral second toe. There is improvement noted to the bilateral great toe. We will dress toes today and hope that they maintain until Monday where he will come in for a nurse visit. He has been advised, multiple times with expressed verbalization, to change the dressings to silvercel if the dressings get wet prior to [facility] appointment. LYNX, GOODRICH ([id number]) 08/08/17-He is here in follow-up evaluation for bilateral first and second toe ulcerations. There is significant improvement to all ulcerations since last visit. We will switch to [facility] to all wounds and he will continue with nurse visits on a Monday/Thursday schedule and follow-up  with me in 2 weeks. He continues to smoke, 1-3 cigarettes per day, and has been encouraged to not smoke until his next follow-up in 2 weeks. 08/15/17 on evaluation today patient's ulcers on his toes actually appear to be doing fairly well the right to ulcers may be a little bit more moist compared to the left I'm not really sure exactly why as the openings appear to be very small and I'm not seeing any evidence of anything significant as far as you lightest or otherwise. Nonetheless this may just be a small setback he's been doing very well with the [facility] Dressing 08/22/17-He is here in follow-up evaluation for ulcerations to his bilateral first and second toes, there is small/scant amount of drainage noted on today's dressing. He continues with Hydrofera Blue. He continues to smoke, 1 cigarette a day. Voices no complaint or concerns, compliant and compression therapy today. He'll follow-up next week Electronic Signature(s) Signed: 08/22/2017 1:02:04 PM By: Lawanda Cousins Entered By: Lawanda Cousins on 08/22/2017 13:02:04 Redford, Travis E. ([id number]) -------------------------------------------------------------------------------- CHEM CAUT GRANULATION TISS Details Patient Name: Lemanski, Hatcher E.  Date of Service: 08/22/2017 12:30 PM Medical Record Number: [id number] Patient Account Number: [id number] Date of Birth/Sex: 07-09-1950 (67 y.o. M) Treating RN: Ahmed Prima Primary Care Provider: Lamonte Sakai Other Clinician: Referring Provider: Lamonte Sakai Treating Provider/Extender: Cathie Olden in Treatment: 47 Procedure Performed for: Wound #6 Left Toe Second Performed By: Physician Lawanda Cousins, NP Post Procedure Diagnosis Same as Pre-procedure Notes silver nitrate stick used for chemical cautery of hypergranular tissue Electronic Signature(s) Signed: 08/22/2017 1:00:33 PM By: Lawanda Cousins Entered By: Lawanda Cousins on 08/22/2017 13:00:33 Umali, [name]  ([id number]) -------------------------------------------------------------------------------- Physician Orders Details Patient Name: Yung, Johnathyn E. Date of Service: 08/22/2017 12:30 PM Medical Record Number: [id number] Patient Account Number: [id number] Date of Birth/Sex: 14-Jun-1950 (67 y.o. M) Treating RN: Ahmed Prima Primary Care Provider: Lamonte Sakai Other Clinician: Referring Provider: Lamonte Sakai Treating Provider/Extender: Cathie Olden in Treatment: 73 Verbal / Phone Orders: Yes Clinician: Carolyne Fiscal, Debi Read Back and Verified: Yes Diagnosis Coding Wound Cleansing Wound #1 Right Toe Great o Clean wound with Normal Saline. - all wounds o Cleanse wound with mild soap and water - all wounds o May Shower, gently pat wound dry prior to applying new dressing. - all wounds Wound #2 Right Toe Second o Clean wound with Normal Saline. - all wounds o Cleanse wound with mild soap and water - all wounds o May Shower, gently pat wound dry prior to applying new dressing. - all wounds Wound #5 Left Toe Great o Clean wound with Normal Saline. - all wounds o Cleanse wound with mild soap and water - all wounds o May Shower, gently pat wound dry prior to applying new dressing. - all wounds Wound #6 Left Toe Second o Clean wound with Normal Saline. - all wounds o Cleanse wound with mild soap and water - all wounds o May Shower, gently pat wound dry prior to applying new dressing. - all wounds Anesthetic (add to Medication List) Wound #1 Right Toe Great o Topical Lidocaine 4% cream applied to wound bed prior to debridement (In Clinic Only). - all wounds Wound #2 Right Toe Second o Topical Lidocaine 4% cream applied to wound bed prior to debridement (In Clinic Only). - all wounds Wound #5 Left Toe Great o Topical Lidocaine 4% cream applied to wound bed prior to debridement (In Clinic Only). - all wounds Wound #6 Left Toe Second o Topical Lidocaine 4%  cream applied to wound bed prior to debridement (In Clinic Only). - all wounds Primary Wound Dressing Wound #1 Right Toe Great o Silver Alginate - between all toes o Hydrafera Blue Ready Transfer Wound #2 Right Toe Second o Silver Alginate - between all toes o [facility] Blue Ready Transfer Wound #5 Left Toe ZUHAIR, LARICCIA ([id number]) o Silver Alginate - between all toes o Hydrafera Blue Ready Transfer Wound #6 Left Toe Second o Silver Alginate - between all toes o Hydrafera Blue Ready Transfer Secondary Dressing Wound #1 Right Toe Great o ABD pad o Kerlix and Coban Wound #2 Right Toe Second o ABD pad o Kerlix and Coban Wound #5 Left Toe Great o ABD pad o Kerlix and Coban Wound #6 Left Toe Second o ABD pad o Kerlix and Coban Dressing Change Frequency Wound #1 Right Toe Great o Dressing is to be changed Monday and Thursday. - all wounds Wound #2 Right Toe Second o Dressing is to be changed Monday and Thursday. - all wounds Wound #5 Left Toe Great o Dressing is to be changed Monday and Thursday. -  all wounds Wound #6 Left Toe Second o Dressing is to be changed Monday and Thursday. - all wounds Follow-up Appointments Wound #1 Right Toe Great o Return Appointment in 1 week. o Nurse Visit as needed - Monday Wound #2 Right Toe Second o Return Appointment in 1 week. o Nurse Visit as needed - Monday Wound #5 Left Toe Great o Return Appointment in 1 week. o Nurse Visit as needed - Monday Wound #6 Left Toe Second o Return Appointment in 1 week. o Nurse Visit as needed - Monday Edema Control Dia, Adrian E. ([id number]) o Patient to wear own compression stockings o Elevate legs to the level of the heart and pump ankles as often as possible Additional Orders / Instructions Wound #1 Right Toe Great o Stop Smoking o Increase protein intake. o Other: - Please add vitamin A, vitamin C and zinc supplements  to your diet Patient Medications Allergies: No Known Drug Allergies Notifications Medication Indication Start End lidocaine DOSE 1 - topical 4 % cream - 1 cream topical Electronic Signature(s) Signed: 08/22/2017 5:24:28 PM By: Lawanda Cousins Signed: 08/23/2017 4:38:26 PM By: Alric Quan Entered By: Alric Quan on 08/22/2017 12:51:52 Felten, [name] ([id number]) -------------------------------------------------------------------------------- Prescription 08/22/2017 Patient Name: [name] Provider: Lawanda Cousins NP Date of Birth: 16-Jan-1951 NPI#: [id number] Sex: Jerilynn Mages DEA#: [id number] Phone #: [phone] License #: Patient Address: [facility] [place], [place] [address] [address] [place], [place] [address] [phone] Allergies No Known Drug Allergies Medication Medication: Route: Strength: Form: lidocaine topical 4% cream Class: TOPICAL LOCAL ANESTHETICS Dose: Frequency / Time: Indication: 1 1 cream topical Number of Refills: Number of Units: 0 Generic Substitution: Start Date: End Date: Administered at Substitution Permitted Facility: Yes Time Administered: Time Discontinued: Note to Pharmacy: Signature(s): Date(s): Electronic Signature(s) Signed: 08/22/2017 5:24:28 PM By: Lawanda Cousins Signed: 08/23/2017 4:38:26 PM By: Alric Quan Entered By: Alric Quan on 08/22/2017 12:51:52 Sabo, Jarrell E. ([id number]) Canion, Kwamaine E. ([id number]) --------------------------------------------------------------------------------  Problem List Details Patient Name: Torti, Jaylin E. Date of Service: 08/22/2017 12:30 PM Medical Record Number: [id number] Patient Account Number: [id number] Date of Birth/Sex: [birth year] (67 y.o. M) Treating RN: Ahmed Prima Primary Care Provider: Lamonte Sakai Other Clinician: Referring Provider: Lamonte Sakai Treating  Provider/Extender: Cathie Olden in Treatment: 4 Active Problems ICD-10 Impacting Encounter Code Description Active Date Wound Healing Diagnosis E11.621 Type 2 diabetes mellitus with foot ulcer 09/21/2016 Yes I87.313 Chronic venous hypertension (idiopathic) with ulcer of 09/21/2016 Yes bilateral lower extremity I89.0 Lymphedema, not elsewhere classified 09/21/2016 Yes L97.522 Non-pressure chronic ulcer of other part of left foot with fat 09/21/2016 Yes layer exposed L97.512 Non-pressure chronic ulcer of other part of right foot with fat 09/21/2016 Yes layer exposed F17.218 Nicotine dependence, cigarettes, with other nicotine-induced 09/21/2016 Yes disorders F10.19 Alcohol abuse with unspecified alcohol-induced disorder 09/21/2016 Yes M86.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, right ankle and foot 02/21/2017 Yes Inactive Problems Resolved Problems Electronic Signature(s) MOSIAH, BASTIN ([id number]) Signed: 08/22/2017 12:57:31 PM By: Lawanda Cousins Entered By: Lawanda Cousins on 08/22/2017 12:57:31 Ruminski, [name] ([id number]) -------------------------------------------------------------------------------- Progress Note Details Patient Name: Lampi, Junius E. Date of Service: 08/22/2017 12:30 PM Medical Record Number: [id number] Patient Account Number: [id number] Date of Birth/Sex: [birth year] (67 y.o. M) Treating RN: Ahmed Prima Primary Care Provider: Lamonte Sakai Other Clinician: Referring Provider: Lamonte Sakai Treating Provider/Extender: Cathie Olden in Treatment: 2 Subjective Chief Complaint Information obtained from Patient Patient presents for treatment of an open diabetic  ulcer to both feet History of Present Illness (HPI) The following HPI elements were documented for the patient's wound: Location: bilateral feet ulceration on the toes Quality: Patient reports experiencing a dull pain to affected area(s). Severity: Patient states wound are getting better Duration:  Patient has had the wound for > 3 months prior to seeking treatment at the wound center Timing: Pain in wound is constant (hurts all the time) Context: The wound would happen gradually Modifying Factors: Other treatment(s) tried include:treatment for lymphedema and is seen by the podiatrist Dr. Caryl Comes Associated Signs and Symptoms: Patient reports having increase swelling. 67 year old patient here to see as for bilateral feet ulceration to on his left first and second toe and 2 on his right first and second toe, which she's had for about 4 months. 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 Donella Stade. The patient is also undergoing workup by medical oncology for a intra-abdominal lymphadenopathy. 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 He is also status post appendectomy, inguinal lymph node biopsy, rectal biopsy and rectal examination under anesthesia. he currently smokes cigarettes about half packet a day. In March of this year he was seen by Dr. Hortencia Pilar, for evaluation of bilateral varicose veins and besides wearing compression stockings he had recommended laser ablation of the right and left great saphenous veins to eleviate the symptoms and complications of severe superficial venous reflux disease. He also recommended lymphedema pumps for better control of his lymphedema. The patient recently has had on 08/23/2016, right greater saphenous vein ablation with the laser energy Earlier lower extremity venous reflux examination done on 05/08/2016 showed no DVT or SVT both lower legs but incompetence of bilateral great saphenous veins was present. A lower arterial study was also done and there was no significant right lower and left lower  extremity problems based on a normal toe brachial index bilaterally and the ABI was 1.21 the left and 1.23 on the right. His post ablation venous duplex examination showed successful ablation of the right GS vein with thrombus formation 2 below the right saphenofemoral junction. The deep system was patent without evidence of thrombosis and this was done on 08/30/2016. 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 Addendum: regarding his x-rays done today and x-ray of the left foot -- IMPRESSION: No objective evidence of osteomyelitis. There are soft tissue changes which may reflect cellulitis. X-ray of the right foot -- IMPRESSION:Findings compatible with cellulitis of the toes. No objective evidence of osteomyelitis is observed. 10/01/16 on evaluation today patient's wounds appeared to be doing some better. I did review the x-rays as well which showed no evidence of osteomyelitis although there was evidence on x-ray of cellulitis. He fortunately is not having any discomfort Goodridge, Mcadoo E. ([id number]) although he continues to have some swelling. He does not remember being on any antibiotics recently. 10/15/16 on evaluation today patient's wounds overall appear to be doing better although he does have a new location noted on the left foot. Fortunately he is not having significant pain. It almost has the appearance that something is rubbing on the end of his toes but he wears the open toe shoes and according  to what he is telling me never wears anything that would rub on his foot. There is no evidence of infection and specifically no evidence of a fungal infection 10/22/16 On evaluation today patient's wounds appeared to be doing better compared to last week in regard to his bilateral lower extremities. Fortunately I happy with how things are progressing although he still has ulcers I feel like  that he is improving and appropriate manner. 11/12/16 on evaluation today patient appears to be doing well in regard to his bilateral feet and the respective wounds. We have been using surrounding her dressings along with an antifungal cream which seems to be doing very well. He has no bilateral dysfunction noticed that the rituals are weight loss at this point. He also has no nausea or vomiting a note purulent discharge. He did see Vein and vascular today and he tells me that they told him he could have surgery for his venous stasis but they did not feel like it was worth it in his words. Fortunately patient's wounds do appear to be getting sneakily better. 11/26/2016 -- he says he is going to have some surgery during this week at [facility] for possibly a colon resection. 12/31/2016 -- the patient has been noncompliant with his smoking and I'm not sure whether he is also started drinking again. He continues to be very nonchalant about his care 01/14/2017 -- the patient's HandP has been reviewed well and I understand he is being compliant with trying to give up smoking and his local dressing changes. He does not have any surgical options of 4 to him by his vascular surgeons.he was last seen in early August by Dr. Hortencia Pilar who recommended compression stockings,and possibly lymph pumps in 2-3 months after doing a review ultrasound. 01/28/2017 - the patient did not have any fresh complaints but on examination I noted a large lacerated wound on the plantar aspect of his right fourth toe which had a lot of necrotic debris and it probes down to bone. 02/07/2017 -- x-ray of the right foot -- IMPRESSION: Soft tissue swelling about the first through fourth toes consistent with cellulitis. New destructive change in the tuft of the distal phalanx of the great toe is consistent with osteomyelitis. 02/14/2017 -- the patient's MRI is pending this coming Monday and he still continues to smoke. We have  again gone over off loading of his wounds in great detail and he says he's been compliant. 02/21/2017 -- MR of the right foot -- IMPRESSION: 1. Soft tissue ulcer at the tip of the first, second and third toe knows. Cortical irregularity and bone marrow edema in the first distal phalanx most concerning for osteomyelitis. Mild marrow edema in the second and third distal phalanx without definite cortical destruction which may reflect early osteomyelitis versus reactive marrow edema. 2. Soft tissue edema surrounding the first phalanx most consistent with cellulitis. the patient was also recently evaluated by his medical oncologist Dr. 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 She is treating him with weekly Procrit. She is also keeping intra-abdominal lymphadenopathy and right lower lobe lung nodule under observation. 04/04/2017 -- he was seen by Dr. Adrian Prows on 03/25/2017 -- after review he empirically put him on ciprofloxacin and doxycycline as they have good bone penetration and good bioavailability and it will cover the usual pathogens and diabetic foot osteomyelitis. He will check inflammatory markers and plan a 69-1 week old records.  C-reactive protein was 0.3 and the ESR was 72 04/18/17 on evaluation today patient appears to be doing about the same in regard to his lower extremity wounds bilaterally. He has continued to use the antifungal cream which does seem to be beneficial. Nonetheless the ulcers do seem to in some areas be epithelial eyes over and in other areas are still open. He is having no significant discomfort. 04/25/17-he is here in follow-up evaluation for multiple ulcerations to multiple toes bilaterally. He states he did see Dr. Ola Spurr again last week and continues antibiotic therapy. He is voicing no complaints or concerns, will continue with current treatment plan will possibility of adding  compression therapy next week after an additional week of treatment/lotions to BLE prescribed by Dr Ola Spurr 05/02/17 he is here in follow up for for multiple ulcers to multiple toes bilaterally. we will stop using antifungal cream and will continue with silvercel and follow up next week Gallacher, Lewie E. ([id number]) 05/09/17-he is here in follow-up for multiple ulcerations to multiple toes bilaterally. There is improvement in appearance. He has not completely stopped using antifungal cream, but admits he has not using it between the toes. He has an appointment with Dr. Ola Spurr on 2/11, continues on doxycycline and Cipro. It has been 5 weeks of antibiotic therapy, we will order plain film xray to evaluate for osteomyelitis next week, prior to follow up with ID. Will continue with silvercel and follow up next week 05/16/17-he is here in follow-up evaluation for multiple ulcerations to multiple toes bilaterally and new wound to the right posterior heel. There is essentially no change in appearance, deteriorating measurements; he has a history of waxing and waning measurements. He admits that he continues to apply moisturizer/cream/ointment to his toes despite weekly reminders to only apply silvercel to his toes. He states that he thinks the surgical shoe contributed to the superficial ulcer to his posterior heel, he is unable to articulate if this was an area of dry cracked skin as he has a similar area to the left heel. He now is wearing open toed slippers. He has an appointment with Dr. Ola Spurr on 2/11. We have ordered x-rays for her bilateral feet; he was advised to obtain the x-rays today or tomorrow. He will follow-up next week 05/23/17-he is here in follow-up evaluation for multiple ulcerations to multiple toes bilaterally and the right posterior heel. There is improvement in maceration. He has been compliant and not applying any moisturizing agent to his toes. He has been using Lac-Hydrin for  his lower extremities with improvement. He did not go to his appointment on Monday with Dr. Ola Spurr secondary to financial concerns. X-rays for her bilateral feet showed: LEFT FOOT with slight erosion of the tuft of the distal phalanges of the left first and second toe suspicious for osteomyelitis, RIGHT FOOT with 1.erosion of the tufts of the distal phalanges of the right first second and possibly third toes consistent with osteomyelitis, 2 no definitive abnormality of the calcaneus is seen on the images obtained, 3. Plantar calcaneal degenerative spur. We briefly discussed hyperbaric adjunctive therapy for treatment of chronic refractory osteomyelitis. I do not find an a1c in EMR, will contact PCP for record, or order if needed. He has been encouraged to contact Dr Ola Spurr office regarding the follow-up appointment, encouraged him to inquire about payment plan. We will continue with same treatment plan and follow-up next week. He states he is still taking antibiotics and has "a lot" left. He states he has been taking  them as directed, 2 pills twice daily. According to Dr. Blane Ohara office notes he was originally started on 12/17 for 4 weeks and extended on 1/14 for an additional 4 weeks. He should be done with his antibiotic therapy, he was advised to bring his bottles and to his next appointment, we will contact pharmacy. 05/30/17-he is here in follow-up evaluation for multiple ulcerations to multiple toes bilaterally and the right posterior heel. He is accompanied by his brother-in-law. Wounds are stable. He has yet to make up with Dr. Ola Spurr. We contacted his PCP, with no record of recent A1c we will draw an A1c. His brother-in-law states that he was taken off all of his diabetic medication secondary to kidney function. 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 The antibiotics were  counted and have approximately 2 weeks left, although they should be complete. The pill organizer reveals missing days. We discussed the need for consistent medications, to have optimal benefit of medication. He has a cousin that lives with him and he will ask her to check his organizer daily. He has been advised to follow up with Dr Ola Spurr, and will go by the office today. He has been advised to quit smoking. 06/06/17-he is here in follow up evaluation. He has had to make an appointment with Dr. Ola Spurr. He did have blood work obtained, a1c 5. He continues to take antibiotic therapy. Significant improvement in bilateral lower extremity edema with compression therapy. Essentially no change in ulcerations to toes. He states he is "going to try something different" and "let me know next week" if it works; he would not provide any additional information and was encouraged to follow our orders. We will follow up next week 06/13/17-he is here in follow-up evaluation. He has an appointment with Dr. Ola Spurr tomorrow morning. He states he purchased an ointment from [facility] and applied to his toes for 3 days, he does not remember the name of the ointment. There is improvement to his wounds, minimal maceration. He continues to take antibiotic therapy, this should have been completed last month. His brother-in-law who regularly accompanies his appointments was asked to take the bottles to the appointment tomorrow with Dr. Ola Spurr so he is aware. We will continue with 3 layer compression, and order OPEN TOE compression 20-30mmHg; we will apply compression stockings next week. He continues to smoke, smoked "2 cigarettes" last week 06/20/17 on evaluation today patient did receive his compression stockings which he has with him today for both lower extremities. With that being said he tells me at this point in time that he is very happy to have these he really is not a big fan of the compression wraps  that we have been utilizing although they have been of great benefit for him. Nonetheless at this point he does want to switch to the compression stockings. In my opinion as long as he is continuing with compression I'm okay with the stockings or the wraps. 06/27/17-he is here in follow-up evaluation for multiple ulcerations to his bilateral toes. There is some improvement in appearance. He is compliant in wearing his compression stockings with significant improvement in lower extremity edema. He saw Dr Ola Spurr on 3/8, per his notes they would redraw ESR and CRP; plan to continue antibiotic therapy if these remain elevated. I do not see an ESR or CRP level in Epic. The patient continues to take antibiotics. 07/11/17-He is here in follow-up evaluation  for multiple ulcerations to multiple toes bilaterally. He presents with complete epithelialization to the right third toe; there has been no deterioration. He continues on antibiotic therapy. He will follow-up next week 07/18/17-He is here in follow-up evaluation for multiple ulcerations to multiple toes bilaterally. He continues to make improvement. He continues on antibiotic therapy. He states he has been using something additional to our orders, he does Seddon, Timmey E. ([id number]) not elaborate but states he will bring it in next week. 07/25/17-He is here in follow up evaluation for multiple ulcerations to bilateral toes. He is stable. He has completed antibiotic therapy. He admits to "filing" his toes after showers each evening, this is what he was referencing last week; he does not filing for the wounds. We will switch to [facility] and monitor for any improvement, he will follow-up next week 08/01/17-He is here in follow-up evaluation. He admits to "picking" at his toes after cleansing yesterday, leading to new areas of tissue loss on the bilateral second toe. There is improvement noted to the bilateral great toe. We will dress toes today  and hope that they maintain until Monday where he will come in for a nurse visit. He has been advised, multiple times with expressed verbalization, to change the dressings to silvercel if the dressings get wet prior to [facility] appointment. 08/08/17-He is here in follow-up evaluation for bilateral first and second toe ulcerations. There is significant improvement to all ulcerations since last visit. We will switch to [facility] to all wounds and he will continue with nurse visits on a Monday/Thursday schedule and follow-up with me in 2 weeks. He continues to smoke, 1-3 cigarettes per day, and has been encouraged to not smoke until his next follow-up in 2 weeks. 08/15/17 on evaluation today patient's ulcers on his toes actually appear to be doing fairly well the right to ulcers may be a little bit more moist compared to the left I'm not really sure exactly why as the openings appear to be very small and I'm not seeing any evidence of anything significant as far as you lightest or otherwise. Nonetheless this may just be a small setback he's been doing very well with the [facility] Dressing 08/22/17-He is here in follow-up evaluation for ulcerations to his bilateral first and second toes, there is small/scant amount of drainage noted on today's dressing. He continues with Hydrofera Blue. He continues to smoke, 1 cigarette a day. Voices no complaint or concerns, compliant and compression therapy today. He'll follow-up next week Patient History Information obtained from Patient. Social History Current every day smoker, Marital Status - Widowed, Alcohol Use - Daily - quit drinking about a week ago, Drug Use - No History, Caffeine Use - Moderate. Medical And Surgical History Notes Oncologic 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 Objective Constitutional Vitals Time Taken: 12:36 PM, Height: 69 in, Weight: 168 lbs, BMI: 24.8, Temperature: 98.4 F, Pulse: 55 bpm,  Respiratory Rate: 16 breaths/min, Blood Pressure: 157/77 mmHg. Integumentary (Hair, Skin) Wound #1 status is Open. Original cause of wound was Gradually Appeared. The wound is located on the Right Toe Great. The wound measures 1cm length x 0.6cm width x 0.1cm depth; 0.471cm^2 area and 0.047cm^3 volume. There is Fat Layer (Subcutaneous Tissue) Exposed exposed. There is no tunneling or undermining noted. There is a medium amount of serosanguineous drainage noted. The wound margin is indistinct and nonvisible. There is large (67-100%) red granulation within the wound bed. There is a small (1-33%) amount  of necrotic tissue within the wound bed including Adherent Slough. The periwound skin appearance did not exhibit: Callus, Crepitus, Excoriation, Induration, Rash, Scarring, Dry/Scaly, Maceration, Atrophie Blanche, Cyanosis, Ecchymosis, Hemosiderin Staining, Mottled, Pallor, Rubor, Erythema. Baize, Onyx E. ([id number]) Wound #2 status is Open. Original cause of wound was Gradually Appeared. The wound is located on the Right Toe Second. The wound measures 0.1cm length x 0.1cm width x 0.1cm depth; 0.008cm^2 area and 0.001cm^3 volume. There is no tunneling or undermining noted. There is a none present amount of drainage noted. The wound margin is flat and intact. There is no granulation within the wound bed. There is no necrotic tissue within the wound bed. The periwound skin appearance did not exhibit: Callus, Crepitus, Excoriation, Induration, Rash, Scarring, Dry/Scaly, Maceration, Atrophie Blanche, Cyanosis, Ecchymosis, Hemosiderin Staining, Mottled, Pallor, Rubor, Erythema. Periwound temperature was noted as No Abnormality. Wound #5 status is Open. Original cause of wound was Gradually Appeared. The wound is located on the Left Toe Great. The wound measures 0.3cm length x 0.2cm width x 0.1cm depth; 0.047cm^2 area and 0.005cm^3 volume. There is no tunneling or undermining noted. There is a large  amount of serous drainage noted. The wound margin is flat and intact. There is large (67-100%) red granulation within the wound bed. There is a small (1-33%) amount of necrotic tissue within the wound bed including Adherent Slough. The periwound skin appearance did not exhibit: Callus, Crepitus, Excoriation, Induration, Rash, Scarring, Dry/Scaly, Maceration, Atrophie Blanche, Cyanosis, Ecchymosis, Hemosiderin Staining, Mottled, Pallor, Rubor, Erythema. Periwound temperature was noted as No Abnormality. Wound #6 status is Open. Original cause of wound was Gradually Appeared. The wound is located on the Left Toe Second. The wound measures 0.3cm length x 0.3cm width x 0.1cm depth; 0.071cm^2 area and 0.007cm^3 volume. There is no tunneling or undermining noted. There is a none present amount of drainage noted. The wound margin is flat and intact. There is large (67-100%) red granulation within the wound bed. There is no necrotic tissue within the wound bed. The periwound skin appearance did not exhibit: Callus, Crepitus, Excoriation, Induration, Rash, Scarring, Dry/Scaly, Maceration, Atrophie Blanche, Cyanosis, Ecchymosis, Hemosiderin Staining, Mottled, Pallor, Rubor, Erythema. Periwound temperature was noted as No Abnormality. Assessment Active Problems ICD-10 E11.621 - Type 2 diabetes mellitus with foot ulcer I87.313 - Chronic venous hypertension (idiopathic) with ulcer of bilateral lower extremity I89.0 - Lymphedema, not elsewhere classified L97.522 - Non-pressure chronic ulcer of other part of left foot with fat layer exposed L97.512 - Non-pressure chronic ulcer of other part of right foot with fat layer exposed F17.218 - Nicotine dependence, cigarettes, with other nicotine-induced disorders F10.19 - Alcohol abuse 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 - Chronic multifocal osteomyelitis, right ankle and foot Procedures Wound #6 Pre-procedure diagnosis of Wound #6 is a Diabetic  Wound/Ulcer of the Lower Extremity located on the Left Toe Second . An CHEM CAUT GRANULATION TISS procedure was performed by Lawanda Cousins, NP. Post procedure Diagnosis Wound #6: Same as Pre-Procedure Notes: silver nitrate stick used for chemical cautery of hypergranular tissue Peth, Karla E. ([id number]) Plan Wound Cleansing: Wound #1 Right Toe Great: Clean wound with Normal Saline. - all wounds Cleanse wound with mild soap and water - all wounds May Shower, gently pat wound dry prior to applying new dressing. - all wounds Wound #2 Right Toe Second: Clean wound with Normal Saline. - all wounds Cleanse wound with mild soap and water - all wounds May Shower, gently pat wound dry prior to applying new dressing. - all  wounds Wound #5 Left Toe Great: Clean wound with Normal Saline. - all wounds Cleanse wound with mild soap and water - all wounds May Shower, gently pat wound dry prior to applying new dressing. - all wounds Wound #6 Left Toe Second: Clean wound with Normal Saline. - all wounds Cleanse wound with mild soap and water - all wounds May Shower, gently pat wound dry prior to applying new dressing. - all wounds Anesthetic (add to Medication List): Wound #1 Right Toe Great: Topical Lidocaine 4% cream applied to wound bed prior to debridement (In Clinic Only). - all wounds Wound #2 Right Toe Second: Topical Lidocaine 4% cream applied to wound bed prior to debridement (In Clinic Only). - all wounds Wound #5 Left Toe Great: Topical Lidocaine 4% cream applied to wound bed prior to debridement (In Clinic Only). - all wounds Wound #6 Left Toe Second: Topical Lidocaine 4% cream applied to wound bed prior to debridement (In Clinic Only). - all wounds Primary Wound Dressing: Wound #1 Right Toe Great: Silver Alginate - between all toes Hydrafera Blue Ready Transfer Wound #2 Right Toe Second: Silver Alginate - between all toes Hydrafera Blue Ready Transfer Wound #5 Left Toe  Great: Silver Alginate - between all toes Hydrafera Blue Ready Transfer Wound #6 Left Toe Second: Silver Alginate - between all toes Hydrafera Blue Ready Transfer Secondary Dressing: Wound #1 Right Toe Great: ABD pad Kerlix and Coban Wound #2 Right Toe Second: ABD pad Kerlix and Coban Wound #5 Left Toe Great: ABD pad Kerlix and Coban Wound #6 Left Toe Second: ABD pad Kerlix and Coban Dressing Change Frequency: Wound #1 Right Toe Great: Dressing is to be changed Monday and Thursday. - all wounds Wound #2 Right Toe Second: Guthrie, Lucia E. ([id number]) Dressing is to be changed Monday and Thursday. - all wounds Wound #5 Left Toe Great: Dressing is to be changed Monday and Thursday. - all wounds Wound #6 Left Toe Second: Dressing is to be changed Monday and Thursday. - all wounds Follow-up Appointments: Wound #1 Right Toe Great: Return Appointment in 1 week. Nurse Visit as needed - Monday Wound #2 Right Toe Second: Return Appointment in 1 week. Nurse Visit as needed - Monday Wound #5 Left Toe Great: Return Appointment in 1 week. Nurse Visit as needed - Monday Wound #6 Left Toe Second: Return Appointment in 1 week. Nurse Visit as needed - Monday Edema Control: Patient to wear own compression stockings Elevate legs to the level of the heart and pump ankles as often as possible Additional Orders / Instructions: Wound #1 Right Toe Great: Stop Smoking Increase protein intake. Other: - Please add vitamin A, vitamin C and zinc supplements to your diet The following medication(s) was prescribed: lidocaine topical 4 % cream 1 1 cream topical was prescribed at facility Electronic Signature(s) Signed: 08/22/2017 1:02:27 PM By: Lawanda Cousins Entered By: Lawanda Cousins on 08/22/2017 13:02:27 [place], [name] ([id number]) -------------------------------------------------------------------------------- ROS/PFSH Details Patient Name: Shutter, Avante E. Date of Service: 08/22/2017 12:30  PM Medical Record Number: [id number] Patient Account Number: [id number] Date of Birth/Sex: 17-Jan-1951 (67 y.o. M) Treating RN: Ahmed Prima Primary Care Provider: Lamonte Sakai Other Clinician: Referring Provider: Lamonte Sakai Treating Provider/Extender: Cathie Olden in Treatment: 52 Information Obtained From Patient Wound History Do you currently have one or more open woundso Yes How many open wounds do you currently haveo 6 Approximately how long have you had your woundso 3 months How have you been treating your wound(s) until nowo ointment and bandage  Has your wound(s) ever healed and then re-openedo No Have you had any lab work done in the past montho No Have you tested positive for an antibiotic resistant organism (MRSA, VRE)o No Have you tested positive for osteomyelitis (bone infection)o No Have you had any tests for circulation on your legso Yes Who ordered the testo PCP Where was the test doneo [facility] Medical History: Negative for: Cataracts; Glaucoma; Optic Neuritis Ear/Nose/Mouth/Throat Medical History: Negative for: Chronic sinus problems/congestion; Middle ear problems Hematologic/Lymphatic Medical History: Positive for: Anemia; Lymphedema Negative for: Hemophilia; Human Immunodeficiency Virus; Sickle Cell Disease Respiratory Medical History: Negative for: Aspiration; Asthma; Chronic Obstructive Pulmonary Disease (COPD); Pneumothorax; Sleep Apnea; Tuberculosis Cardiovascular Medical History: Positive for: Congestive Heart Failure; Hypertension; Peripheral Venous Disease Negative for: Angina; Arrhythmia; Coronary Artery Disease; Deep Vein Thrombosis; Hypotension; Myocardial Infarction; Peripheral Arterial Disease; Phlebitis; Vasculitis Gastrointestinal Medical History: Negative for: Cirrhosis ; Colitis; Crohnos; Hepatitis A; Hepatitis B; Hepatitis C Kinsella, Carr E. ([id number]) Endocrine Medical History: Positive for: Type II Diabetes Treated with:  Oral agents Blood sugar tested every day: Yes Tested : QD Genitourinary Medical History: Negative for: End Stage Renal Disease Immunological Medical History: Negative for: Lupus Erythematosus; Raynaudos; Scleroderma Integumentary (Skin) Medical History: Negative for: History of Burn; History of pressure wounds Musculoskeletal Medical History: Negative for: Gout; Rheumatoid Arthritis; Osteoarthritis; Osteomyelitis Neurologic Medical History: Positive for: Neuropathy Negative for: Dementia; Quadriplegia; Paraplegia; Seizure Disorder Oncologic Medical History: Past Medical History Notes: 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 Electronic Signature(s) Signed: 08/22/2017 5:24:28 PM By: Lawanda Cousins Signed: 08/23/2017 4:38:26 PM By: Warnell Bureau ([id number]) Entered By: Lawanda Cousins on 08/22/2017 13:02:12 Lew[place], [name] ([id number]) -------------------------------------------------------------------------------- SuperBill Details Patient Name: Friesenhahn, Irven E. Date of Service: 08/22/2017 Medical Record Number: [id number] Patient Account Number: [id number] Date of Birth/Sex: Aug 27, 1950 (67 y.o. M) Treating RN: Ahmed Prima Primary Care Provider: Lamonte Sakai Other Clinician: Referring Provider: Lamonte Sakai Treating Provider/Extender: Cathie Olden in  Treatment: 47 Diagnosis Coding ICD-10 Codes Code Description E11.621 Type 2 diabetes mellitus with foot ulcer I87.313 Chronic venous hypertension (idiopathic) with ulcer of bilateral lower extremity I89.0 Lymphedema, not elsewhere classified L97.522 Non-pressure chronic ulcer of other part of left foot with fat layer exposed L97.512 Non-pressure chronic ulcer of other part of right foot with fat layer exposed F17.218 Nicotine dependence, cigarettes, 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 abuse 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, right ankle and foot Facility Procedures CPT4 Code: 44920100 Description: 99214 - WOUND CARE VISIT-LEV 4 EST PT Modifier: Quantity: 1 CPT4 Code: [id number] Description: 83254 - CHEM CAUT GRANULATION TISS ICD-10 Diagnosis Description E11.621 Type 2 diabetes mellitus with foot ulcer L97.522 Non-pressure chronic ulcer of other part of left foot with fat Modifier: layer exposed Quantity: 1 Physician Procedures CPT4 Code: 9826415 Description: 83094 - WC PHYS CHEM CAUT GRAN TISSUE ICD-10 Diagnosis Description E11.621 Type 2 diabetes mellitus with foot ulcer L97.522 Non-pressure chronic ulcer of other part of left foot with fat Modifier: layer exposed Quantity: 1 Electronic  Signature(s) Signed: 08/22/2017 1:02:42 PM By: Lawanda Cousins Entered By: Lawanda Cousins on 08/22/2017 13:02:42

## 2017-08-26 DIAGNOSIS — E11621 Type 2 diabetes mellitus with foot ulcer: Secondary | ICD-10-CM | POA: Diagnosis not present

## 2017-08-27 NOTE — Progress Notes (Addendum)
[name] ([id number]) Visit Report for 08/26/2017 Arrival Information Details Patient Name: [name], [name] Date of Service: 08/26/2017 3:45 PM Medical Record Number: [id number] Patient Account Number: [id number] Date of Birth/Sex: 01/30/1951 (67 y.o. M) Treating RN: Montey Hora Primary Care Elize Pinon: Lamonte Sakai Other Clinician: Referring Isrrael Fluckiger: Lamonte Sakai Treating Laquisha Northcraft/Extender: Melburn Hake, HOYT Weeks in Treatment: 66 Visit Information History Since Last Visit Added or deleted any medications: No Patient Arrived: Ambulatory Any new allergies or adverse reactions: No Arrival Time: 16:09 Had a fall or experienced change in No Accompanied By: self activities of daily living that may affect Transfer Assistance: None risk of falls: Patient Identification Verified: Yes Signs or symptoms of abuse/neglect since last visito No Secondary Verification Process Completed: Yes Hospitalized since last visit: No Patient Requires Transmission-Based No Implantable device outside of the clinic excluding No Precautions: cellular tissue based products placed in the center Patient Has Alerts: Yes since last visit: Patient Alerts: DMII Has Dressing in Place as Prescribed: Yes Pain Present Now: No Electronic Signature(s) Signed: 08/26/2017 4:33:47 PM By: Montey Hora Previous Signature: 08/26/2017 4:28:20 PM Version By: Montey Hora Entered By: Montey Hora on 08/26/2017 16:33:46 Maino, [name] ([id number]) -------------------------------------------------------------------------------- Clinic Level of Care Assessment Details Patient Name: [name], [name] Date of Service: 08/26/2017 3:45 PM Medical Record Number: [id number] Patient Account Number: [id number] Date of Birth/Sex: 1950/08/31 (67 y.o. M) Treating RN: Montey Hora Primary Care Cordella Nyquist: Lamonte Sakai Other Clinician: Referring Garrin Kirwan: Lamonte Sakai Treating Inza Mikrut/Extender: Melburn Hake, HOYT Weeks in Treatment:  59 Clinic Level of Care Assessment Items TOOL 4 Quantity Score []  - Use when only an EandM is performed on FOLLOW-UP visit 0 ASSESSMENTS - Nursing Assessment / Reassessment X - Reassessment of Co-morbidities (includes updates in patient status) 1 10 X- 1 5 Reassessment of Adherence to Treatment Plan ASSESSMENTS - Wound and Skin Assessment / Reassessment []  - Simple Wound Assessment / Reassessment - one wound 0 X- 4 5 Complex Wound Assessment / Reassessment - multiple wounds []  - 0 Dermatologic / Skin Assessment (not related to wound area) ASSESSMENTS - Focused Assessment []  - Circumferential Edema Measurements - multi extremities 0 []  - 0 Nutritional Assessment / Counseling / Intervention []  - 0 Lower Extremity Assessment (monofilament, tuning fork, pulses) []  - 0 Peripheral Arterial Disease Assessment (using hand held doppler) ASSESSMENTS - Ostomy and/or Continence Assessment and Care []  - Incontinence Assessment and Management 0 []  - 0 Ostomy Care Assessment and Management (repouching, etc.) PROCESS - Coordination of Care X - Simple Patient / Family Education for ongoing care 1 15 []  - 0 Complex (extensive) Patient / Family Education for ongoing care []  - 0 Staff obtains Programmer, systems, Records, Test Results / Process Orders []  - 0 Staff telephones HHA, Nursing Homes / Clarify orders / etc []  - 0 Routine Transfer to another Facility (non-emergent condition) []  - 0 Routine Hospital Admission (non-emergent condition) []  - 0 New Admissions / Biomedical engineer / Ordering NPWT, Apligraf, etc. []  - 0 Emergency Hospital Admission (emergent condition) X- 1 10 Simple Discharge Coordination Mcmahan, Theadore E. ([id number]) []  - 0 Complex (extensive) Discharge Coordination PROCESS - Special Needs []  - Pediatric / Minor Patient Management 0 []  - 0 Isolation Patient Management []  - 0 Hearing / Language / Visual special needs []  - 0 Assessment of Community assistance  (transportation, D/C planning, etc.) []  - 0 Additional assistance / Altered mentation []  - 0 Support Surface(s) Assessment (bed, cushion, seat, etc.) INTERVENTIONS - Wound Cleansing / Measurement []  - Simple Wound  Cleansing - one wound 0 X- 4 5 Complex Wound Cleansing - multiple wounds X- 1 5 Wound Imaging (photographs - any number of wounds) []  - 0 Wound Tracing (instead of photographs) []  - 0 Simple Wound Measurement - one wound X- 4 5 Complex Wound Measurement - multiple wounds INTERVENTIONS - Wound Dressings X - Small Wound Dressing one or multiple wounds 4 10 []  - 0 Medium Wound Dressing one or multiple wounds []  - 0 Large Wound Dressing one or multiple wounds []  - 0 Application of Medications - topical []  - 0 Application of Medications - injection INTERVENTIONS - Miscellaneous []  - External ear exam 0 []  - 0 Specimen Collection (cultures, biopsies, blood, body fluids, etc.) []  - 0 Specimen(s) / Culture(s) sent or taken to Lab for analysis []  - 0 Patient Transfer (multiple staff / Civil Service fast streamer / Similar devices) []  - 0 Simple Staple / Suture removal (25 or less) []  - 0 Complex Staple / Suture removal (26 or more) []  - 0 Hypo / Hyperglycemic Management (close monitor of Blood Glucose) []  - 0 Ankle / Brachial Index (ABI) - do not check if billed separately X- 1 5 Vital Signs Pilat, Nahshon E. ([id number]) Has the patient been seen at the hospital within the last three years: Yes Total Score: 150 Level Of Care: New/Established - Level 4 Electronic Signature(s) Signed: 08/26/2017 4:46:49 PM By: Montey Hora Entered By: Montey Hora on 08/26/2017 16:39:32 Balicki, [name] ([id number]) -------------------------------------------------------------------------------- Encounter Discharge Information Details Patient Name: [name], [name] Date of Service: 08/26/2017 3:45 PM Medical Record Number: [id number] Patient Account Number: [id number] Date of Birth/Sex: 1950-12-23  (67 y.o. M) Treating RN: Montey Hora Primary Care Karesa Maultsby: Lamonte Sakai Other Clinician: Referring Izeah Vossler: Lamonte Sakai Treating Miyako Oelke/Extender: Melburn Hake, HOYT Weeks in Treatment: 88 Encounter Discharge Information Items Discharge Condition: Stable Ambulatory Status: Ambulatory Discharge Destination: Home Transportation: Private Auto Accompanied By: self Schedule Follow-up Appointment: Yes Clinical Summary of Care: Electronic Signature(s) Signed: 08/26/2017 4:39:03 PM By: Montey Hora Entered By: Montey Hora on 08/26/2017 16:39:03 Salvador, [name] ([id number]) -------------------------------------------------------------------------------- Patient/Caregiver Education Details Patient Name: [name] Date of Service: 08/26/2017 3:45 PM Medical Record Number: [id number] Patient Account Number: [id number] Date of Birth/Gender: 1951-01-10 (67 y.o. M) Treating RN: Montey Hora Primary Care Physician: Lamonte Sakai Other Clinician: Referring Physician: Lamonte Sakai Treating Physician/Extender: Sharalyn Ink in Treatment: 21 Education Assessment Education Provided To: Patient Education Topics Provided Wound/Skin Impairment: Handouts: Other: wound care as ordered Methods: Demonstration, Explain/Verbal Responses: State content correctly Electronic Signature(s) Signed: 08/26/2017 4:46:49 PM By: Montey Hora Entered By: Montey Hora on 08/26/2017 16:38:50 Weimer, [name] ([id number]) -------------------------------------------------------------------------------- Wound Assessment Details Patient Name: Zeis, Manish E. Date of Service: 08/26/2017 3:45 PM Medical Record Number: [id number] Patient Account Number: [id number] Date of Birth/Sex: [birth year] (67 y.o. M) Treating RN: Montey Hora Primary Care Laren Whaling: Lamonte Sakai Other Clinician: Referring Koron Godeaux: Lamonte Sakai Treating Azizah Lisle/Extender: Melburn Hake, HOYT Weeks in Treatment: 4 Wound  Status Wound Number: 1 Primary Diabetic Wound/Ulcer of the Lower Extremity Etiology: Wound Location: Right Toe Great Wound Open Wounding Event: Gradually Appeared Status: Date Acquired: 06/11/2016 Comorbid Anemia, Lymphedema, Congestive Heart Weeks Of Treatment: 48 History: Failure, Hypertension, Peripheral Venous Clustered Wound: No Disease, Type II Diabetes, Neuropathy Pending Amputation On Presentation Wound Measurements Length: (cm) 1 Width: (cm) 0.6 Depth: (cm) 0.1 Area: (cm) 0.471 Volume: (cm) 0.047 % Reduction in Area: 99% % Reduction in Volume: 99% Epithelialization: Medium (34-66%) Tunneling: No Undermining: No Wound Description Classification: Grade 2 Wound  Margin: Indistinct, nonvisible Exudate Amount: Medium Exudate Type: Serosanguineous Exudate Color: red, brown Foul Odor After Cleansing: No Slough/Fibrino Yes Wound Bed Granulation Amount: Large (67-100%) Exposed Structure Granulation Quality: Red Fascia Exposed: No Necrotic Amount: Small (1-33%) Fat Layer (Subcutaneous Tissue) Exposed: Yes Necrotic Quality: Adherent Slough Tendon Exposed: No Muscle Exposed: No Joint Exposed: No Bone Exposed: No Periwound Skin Texture Texture Color No Abnormalities Noted: No No Abnormalities Noted: No Callus: No Atrophie Blanche: No Crepitus: No Cyanosis: No Excoriation: No Ecchymosis: No Induration: No Erythema: No Rash: No Hemosiderin Staining: No Scarring: No Mottled: No Pallor: No Moisture Rubor: No No Abnormalities Noted: No Dry / Scaly: No Temperature / Pain Maceration: No Temperature: No Abnormality Ensey, Paulmichael E. ([id number]) Wound Preparation Ulcer Cleansing: Rinsed/Irrigated with Saline Topical Anesthetic Applied: None Treatment Notes Wound #1 (Right Toe Great) 1. Cleansed with: Clean wound with Normal Saline 2. Anesthetic Topical Lidocaine 4% cream to wound bed prior to debridement 4. Dressing Applied: Hydrafera Blue 5. Secondary  Dressing Applied Dry Gauze Notes silver cell rope weave in between toes on both feet , cover with gauze and wrap with coban/kerlix and tape with darko shoes bilaTERAL Electronic Signature(s) Signed: 08/26/2017 4:34:52 PM By: Montey Hora Entered By: Montey Hora on 08/26/2017 16:34:52 Krolikowski, [name] ([id number]) -------------------------------------------------------------------------------- Wound Assessment Details Patient Name: Sutter, Parminder E. Date of Service: 08/26/2017 3:45 PM Medical Record Number: [id number] Patient Account Number: [id number] Date of Birth/Sex: [birth year] (67 y.o. M) Treating RN: Montey Hora Primary Care Kieryn Burtis: Lamonte Sakai Other Clinician: Referring Jahnyla Parrillo: Lamonte Sakai Treating Adriann Thau/Extender: Melburn Hake, HOYT Weeks in Treatment: 87 Wound Status Wound Number: 2 Primary Diabetic Wound/Ulcer of the Lower Extremity Etiology: Wound Location: Right Toe Second Wound Open Wounding Event: Gradually Appeared Status: Date Acquired: 06/11/2016 Comorbid Anemia, Lymphedema, Congestive Heart Weeks Of Treatment: 48 History: Failure, Hypertension, Peripheral Venous Clustered Wound: No Disease, Type II Diabetes, Neuropathy Pending Amputation On Presentation Wound Measurements Length: (cm) 0.1 Width: (cm) 0.1 Depth: (cm) 0.1 Area: (cm) 0.008 Volume: (cm) 0.001 % Reduction in Area: 99.7% % Reduction in Volume: 99.7% Epithelialization: Large (67-100%) Tunneling: No Undermining: No Wound Description Classification: Grade 2 Foul Od Wound Margin: Flat and Intact Slough/ Exudate Amount: None Present or After Cleansing: No Fibrino No Wound Bed Granulation Amount: None Present (0%) Exposed Structure Necrotic Amount: None Present (0%) Fascia Exposed: No Fat Layer (Subcutaneous Tissue) Exposed: No Tendon Exposed: No Muscle Exposed: No Joint Exposed: No Bone Exposed: No Periwound Skin Texture Texture Color No Abnormalities Noted: No No  Abnormalities Noted: No Callus: No Atrophie Blanche: No Crepitus: No Cyanosis: No Excoriation: No Ecchymosis: No Induration: No Erythema: No Rash: No Hemosiderin Staining: No Scarring: No Mottled: No Pallor: No Moisture Rubor: No No Abnormalities Noted: No Dry / Scaly: No Temperature / Pain Maceration: No Temperature: No Abnormality Wound Preparation Popov, Acy E. ([id number]) Ulcer Cleansing: Rinsed/Irrigated with Saline Topical Anesthetic Applied: None Treatment Notes Wound #2 (Right Toe Second) 1. Cleansed with: Clean wound with Normal Saline 2. Anesthetic Topical Lidocaine 4% cream to wound bed prior to debridement 4. Dressing Applied: Hydrafera Blue 5. Secondary Dressing Applied Dry Gauze Notes silver cell rope weave in between toes on both feet , cover with gauze and wrap with coban/kerlix and tape with darko shoes bilaTERAL Electronic Signature(s) Signed: 08/26/2017 4:35:13 PM By: Montey Hora Entered By: Montey Hora on 08/26/2017 16:35:12 Nau, [name] ([id number]) -------------------------------------------------------------------------------- Wound Assessment Details Patient Name: Heid, Cataldo E. Date of Service: 08/26/2017 3:45 PM Medical Record Number: [id number] Patient Account Number:  [id number] Date of Birth/Sex: [birth year] (67 y.o. M) Treating RN: Montey Hora Primary Care Brason Berthelot: Lamonte Sakai Other Clinician: Referring Jams Trickett: Lamonte Sakai Treating Lokelani Lutes/Extender: Melburn Hake, HOYT Weeks in Treatment: 17 Wound Status Wound Number: 5 Primary Diabetic Wound/Ulcer of the Lower Extremity Etiology: Wound Location: Left Toe Great Wound Open Wounding Event: Gradually Appeared Status: Date Acquired: 06/11/2016 Comorbid Anemia, Lymphedema, Congestive Heart Weeks Of Treatment: 48 History: Failure, Hypertension, Peripheral Venous Clustered Wound: No Disease, Type II Diabetes, Neuropathy Pending Amputation On Presentation Wound  Measurements Length: (cm) 0.3 Width: (cm) 0.2 Depth: (cm) 0.1 Area: (cm) 0.047 Volume: (cm) 0.005 % Reduction in Area: 99.7% % Reduction in Volume: 99.7% Epithelialization: None Tunneling: No Undermining: No Wound Description Classification: Grade 2 Wound Margin: Flat and Intact Exudate Amount: Large Exudate Type: Serous Exudate Color: amber Foul Odor After Cleansing: No Slough/Fibrino Yes Wound Bed Granulation Amount: Large (67-100%) Exposed Structure Granulation Quality: Red Fascia Exposed: No Necrotic Amount: Small (1-33%) Fat Layer (Subcutaneous Tissue) Exposed: No Necrotic Quality: Adherent Slough Tendon Exposed: No Muscle Exposed: No Joint Exposed: No Bone Exposed: No Periwound Skin Texture Texture Color No Abnormalities Noted: No No Abnormalities Noted: No Callus: No Atrophie Blanche: No Crepitus: No Cyanosis: No Excoriation: No Ecchymosis: No Induration: No Erythema: No Rash: No Hemosiderin Staining: No Scarring: No Mottled: No Pallor: No Moisture Rubor: No No Abnormalities Noted: No Dry / Scaly: No Temperature / Pain Maceration: No Temperature: No Abnormality Tillison, [name] ([id number]) Wound Preparation Ulcer Cleansing: Rinsed/Irrigated with Saline Topical Anesthetic Applied: None Treatment Notes Wound #5 (Left Toe Great) 1. Cleansed with: Clean wound with Normal Saline 2. Anesthetic Topical Lidocaine 4% cream to wound bed prior to debridement 4. Dressing Applied: Hydrafera Blue 5. Secondary Dressing Applied Dry Gauze Notes silver cell rope weave in between toes on both feet , cover with gauze and wrap with coban/kerlix and tape with darko shoes bilaTERAL Electronic Signature(s) Signed: 08/26/2017 4:36:49 PM By: Montey Hora Entered By: Montey Hora on 08/26/2017 16:36:48 Demedeiros, Idris E. ([id number]) -------------------------------------------------------------------------------- Wound Assessment Details Patient Name: Ivie,  Kimble E. Date of Service: 08/26/2017 3:45 PM Medical Record Number: [id number] Patient Account Number: [id number] Date of Birth/Sex: Mar 05, 1951 (67 y.o. M) Treating RN: Montey Hora Primary Care Maxie Debose: Lamonte Sakai Other Clinician: Referring Devonna Oboyle: Lamonte Sakai Treating Konya Fauble/Extender: Melburn Hake, HOYT Weeks in Treatment: 94 Wound Status Wound Number: 6 Primary Diabetic Wound/Ulcer of the Lower Extremity Etiology: Wound Location: Left Toe Second Wound Open Wounding Event: Gradually Appeared Status: Date Acquired: 06/11/2016 Comorbid Anemia, Lymphedema, Congestive Heart Weeks Of Treatment: 48 History: Failure, Hypertension, Peripheral Venous Clustered Wound: No Disease, Type II Diabetes, Neuropathy Pending Amputation On Presentation Wound Measurements Length: (cm) 0.3 Width: (cm) 0.3 Depth: (cm) 0.1 Area: (cm) 0.071 Volume: (cm) 0.007 % Reduction in Area: 96.7% % Reduction in Volume: 96.7% Epithelialization: Large (67-100%) Tunneling: No Undermining: No Wound Description Classification: Grade 2 Foul Od Wound Margin: Flat and Intact Slough/ Exudate Amount: None Present or After Cleansing: No Fibrino No Wound Bed Granulation Amount: Large (67-100%) Exposed Structure Granulation Quality: Red Fascia Exposed: No Necrotic Amount: None Present (0%) Fat Layer (Subcutaneous Tissue) Exposed: No Tendon Exposed: No Muscle Exposed: No Joint Exposed: No Bone Exposed: No Periwound Skin Texture Texture Color No Abnormalities Noted: No No Abnormalities Noted: No Callus: No Atrophie Blanche: No Crepitus: No Cyanosis: No Excoriation: No Ecchymosis: No Induration: No Erythema: No Rash: No Hemosiderin Staining: No Scarring: No Mottled: No Pallor: No Moisture Rubor: No No Abnormalities Noted: No Dry / Scaly: No Temperature /  Pain Maceration: No Temperature: No Abnormality Wound Preparation Kohlman, Bryson E. ([id number]) Ulcer Cleansing: Rinsed/Irrigated with  Saline Topical Anesthetic Applied: None Treatment Notes Wound #6 (Left Toe Second) 1. Cleansed with: Clean wound with Normal Saline 2. Anesthetic Topical Lidocaine 4% cream to wound bed prior to debridement 4. Dressing Applied: Hydrafera Blue 5. Secondary Dressing Applied Dry Gauze Notes silver cell rope weave in between toes on both feet , cover with gauze and wrap with coban/kerlix and tape with darko shoes bilaTERAL Electronic Signature(s) Signed: 08/26/2017 4:37:07 PM By: Montey Hora Entered By: Montey Hora on 08/26/2017 16:37:07

## 2017-08-29 ENCOUNTER — Encounter: Payer: Medicare HMO | Admitting: Nurse Practitioner

## 2017-08-29 DIAGNOSIS — E11621 Type 2 diabetes mellitus with foot ulcer: Secondary | ICD-10-CM | POA: Diagnosis not present

## 2017-08-30 NOTE — Progress Notes (Addendum)
GREYDEN, BESECKER ([id number]) Visit Report for 08/29/2017 Chief Complaint Document Details Patient Name: [name], [name] Date of Service: 08/29/2017 8:45 AM Medical Record Number: [id number] Patient Account Number: [id number] Date of Birth/Sex: 1950/11/19 (67 y.o. M) Treating RN: Roger Shelter Primary Care Provider: Lamonte Sakai Other Clinician: Referring Provider: Lamonte Sakai Treating Provider/Extender: Cathie Olden in Treatment: 74 Information Obtained from: Patient Chief Complaint Patient presents for treatment of an open diabetic ulcer to both feet Electronic Signature(s) Signed: 08/29/2017 10:13:15 AM By: Lawanda Cousins Entered By: Lawanda Cousins on 08/29/2017 10:13:14 Lamphear, [name] ([id number]) -------------------------------------------------------------------------------- Debridement Details Patient Name: [name], [name] Date of Service: 08/29/2017 8:45 AM Medical Record Number: [id number] Patient Account Number: [id number] Date of Birth/Sex: 1950/07/20 (67 y.o. M) Treating RN: Roger Shelter Primary Care Provider: Lamonte Sakai Other Clinician: Referring Provider: Lamonte Sakai Treating Provider/Extender: Cathie Olden in Treatment: 48 Debridement Performed for Wound #1 Right Toe Great Assessment: Performed By: Physician Lawanda Cousins, NP Debridement Type: Debridement Severity of Tissue Pre Limited to breakdown of skin Debridement: Pre-procedure Verification/Time Yes - 09:23 Out Taken: Start Time: 09:23 Pain Control: Other : lidocaine 4% Total Area Debrided (L x W): 0.3 (cm) x 0.5 (cm) = 0.15 (cm) Tissue and other material Viable, Non-Viable, Callus, Skin: Dermis , Skin: Epidermis, Biofilm debrided: Level: Skin/Epidermis Debridement Description: Selective/Open Wound Instrument: Curette Bleeding: Minimum Hemostasis Achieved: Pressure End Time: 09:25 Procedural Pain: 0 Post Procedural Pain: 0 Response to Treatment: Procedure was tolerated  well Level of Consciousness: Awake and Alert Post Procedure Vitals: Temperature: 98.0 Pulse: 64 Respiratory Rate: 16 Blood Pressure: Systolic Blood Pressure: 734 Diastolic Blood Pressure: 86 Post Debridement Measurements of Total Wound Length: (cm) 1 Width: (cm) 1 Depth: (cm) 0.1 Volume: (cm) 0.079 Character of Wound/Ulcer Post Debridement: Stable Severity of Tissue Post Debridement: Limited to breakdown of skin Post Procedure Diagnosis Same as Pre-procedure Electronic Signature(s) Signed: 08/29/2017 10:12:14 AM By: Lawanda Cousins Signed: 08/29/2017 4:33:19 PM By: Jerrell Mylar ([id number]) Entered By: Lawanda Cousins on 08/29/2017 10:12:13 [name], [name] ([id number]) -------------------------------------------------------------------------------- HPI Details Patient Name: [name], [name] Date of Service: 08/29/2017 8:45 AM Medical Record Number: [id number] Patient Account Number: [id number] Date of Birth/Sex: 1951/01/23 (67 y.o. M) Treating RN: Roger Shelter Primary Care Provider: Lamonte Sakai Other Clinician: Referring Provider: Lamonte Sakai Treating Provider/Extender: Cathie Olden in Treatment: 48 History of Present Illness Location: bilateral feet ulceration on the toes Quality: Patient reports experiencing a dull pain to affected area(s). Severity: Patient states wound are getting better Duration: Patient has had the wound for > 3 months prior to seeking treatment at the wound center Timing: Pain in wound is constant (hurts all the time) Context: The wound would happen gradually Modifying Factors: Other treatment(s) tried include:treatment for lymphedema and is seen by the podiatrist Dr. Caryl Comes Associated Signs and Symptoms: Patient reports having increase swelling. HPI Description: 67 year old patient here to see as for bilateral feet ulceration to on his left first and second toe and 2 on his right first and second toe, which she's had for  about 4 months. 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 Donella Stade. The patient is also undergoing workup by medical oncology for a intra-abdominal lymphadenopathy. 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 He is also status  post appendectomy, inguinal lymph node biopsy, rectal biopsy and rectal examination under anesthesia. he currently smokes cigarettes about half packet a day. In March of this year he was seen by Dr. Hortencia Pilar, for evaluation of bilateral varicose veins and besides wearing compression stockings he had recommended laser ablation of the right and left great saphenous veins to eleviate the symptoms and complications of severe superficial venous reflux disease. He also recommended lymphedema pumps for better control of his lymphedema. The patient recently has had on 08/23/2016, right greater saphenous vein ablation with the laser energy Earlier lower extremity venous reflux examination done on 05/08/2016 showed no DVT or SVT both lower legs but incompetence of bilateral great saphenous veins was present. A lower arterial study was also done and there was no significant right lower and left lower extremity problems based on a normal toe brachial index bilaterally and the ABI was 1.21 the left and 1.23 on the right. His post ablation venous duplex examination showed successful ablation of the right GS vein with thrombus formation 2 below the right saphenofemoral junction. The deep system was patent without evidence of thrombosis and this was done on 08/30/2016. 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 Addendum:  regarding his x-rays done today and x-ray of the left foot -- IMPRESSION: No objective evidence of osteomyelitis. There are soft tissue changes which may reflect cellulitis. X-ray of the right foot -- IMPRESSION:Findings compatible with cellulitis of the toes. No objective evidence of osteomyelitis is observed. 10/01/16 on evaluation today patient's wounds appeared to be doing some better. I did review the x-rays as well which showed no evidence of osteomyelitis although there was evidence on x-ray of cellulitis. He fortunately is not having any discomfort although he continues to have some swelling. He does not remember being on any antibiotics recently. 10/15/16 on evaluation today patient's wounds overall appear to be doing better although he does have a new location noted on the left foot. Fortunately he is not having significant pain. It almost has the appearance that something is rubbing on the end of his toes but he wears the open toe shoes and according to what he is telling me never wears anything that would rub on his foot. There is no evidence of infection and specifically no evidence of a fungal infection 10/22/16 On evaluation today patient's wounds appeared to be doing better compared to last week in regard to his bilateral [name], [name] ([id number]) lower extremities. Fortunately I happy with how things are progressing although he still has ulcers I feel like that he is improving and appropriate manner. 11/12/16 on evaluation today patient appears to be doing well in regard to his bilateral feet and the respective wounds. We have been using surrounding her dressings along with an antifungal cream which seems to be doing very well. He has no bilateral dysfunction noticed that the rituals are weight loss at this point. He also has no nausea or vomiting a note purulent discharge. He did see Vein and vascular today and he tells me that they told him he could have surgery for his venous stasis  but they did not feel like it was worth it in his words. Fortunately patient's wounds do appear to be getting sneakily better. 11/26/2016 -- he says he is going to have some surgery during this week at [facility] for possibly a colon resection. 12/31/2016 --  the patient has been noncompliant with his smoking and I'm not sure whether he is also started drinking again. He continues to be very nonchalant about his care 01/14/2017 -- the patient's HandP has been reviewed well and I understand he is being compliant with trying to give up smoking and his local dressing changes. He does not have any surgical options of 4 to him by his vascular surgeons.he was last seen in early August by Dr. Hortencia Pilar who recommended compression stockings,and possibly lymph pumps in 2-3 months after doing a review ultrasound. 01/28/2017 - the patient did not have any fresh complaints but on examination I noted a large lacerated wound on the plantar aspect of his right fourth toe which had a lot of necrotic debris and it probes down to bone. 02/07/2017 -- x-ray of the right foot -- IMPRESSION: Soft tissue swelling about the first through fourth toes consistent with cellulitis. New destructive change in the tuft of the distal phalanx of the great toe is consistent with osteomyelitis. 02/14/2017 -- the patient's MRI is pending this coming Monday and he still continues to smoke. We have again gone over off loading of his wounds in great detail and he says he's been compliant. 02/21/2017 -- MR of the right foot -- IMPRESSION: 1. Soft tissue ulcer at the tip of the first, second and third toe knows. Cortical irregularity and bone marrow edema in the first distal phalanx most concerning for osteomyelitis. Mild marrow edema in the second and third distal phalanx without definite cortical destruction which may reflect early osteomyelitis versus reactive marrow edema. 2. Soft tissue edema surrounding the first phalanx  most consistent with cellulitis. the patient was also recently evaluated by his medical oncologist Dr. 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 She is treating him with weekly Procrit. She is also keeping intra-abdominal lymphadenopathy and right lower lobe lung nodule under observation. 04/04/2017 -- he was seen by Dr. Adrian Prows on 03/25/2017 -- after review he empirically put him on ciprofloxacin and doxycycline as they have good bone penetration and good bioavailability and it will cover the usual pathogens and diabetic foot osteomyelitis. He will check inflammatory markers and plan a 22-41 week old records. C-reactive protein was 0.3 and the ESR was 72 04/18/17 on evaluation today patient appears to be doing about the same in regard to his lower extremity wounds bilaterally. He has continued to use the antifungal cream which does seem to be beneficial. Nonetheless the ulcers do seem to in some areas be epithelial eyes over and in other areas are still open. He is having no significant discomfort. 04/25/17-he is here in follow-up evaluation for multiple ulcerations to multiple toes bilaterally. He states he did see Dr. Ola Spurr again last week and continues antibiotic therapy. He is voicing no complaints or concerns, will continue with current treatment plan will possibility of adding compression therapy next week after an additional week of treatment/lotions to BLE prescribed by Dr Ola Spurr 05/02/17 he is here in follow up for for multiple ulcers to multiple toes bilaterally. we will stop using antifungal cream and will continue with silvercel and follow up next week 05/09/17-he is here in follow-up for multiple ulcerations to multiple toes bilaterally. There is improvement in appearance. He has not completely stopped using antifungal cream, but admits he has not using it between the toes. He has an appointment with  Dr. Ola Spurr on 2/11, continues on doxycycline and Cipro.  It has been 5 weeks of antibiotic therapy, we will order plain film xray to evaluate for osteomyelitis next week, prior to follow up with ID. Will continue with silvercel and follow up next week 05/16/17-he is here in follow-up evaluation for multiple ulcerations to multiple toes bilaterally and new wound to the right posterior heel. There is essentially no change in appearance, deteriorating measurements; he has a history of waxing and waning measurements. He admits that he continues to apply moisturizer/cream/ointment to his toes despite weekly reminders to only apply silvercel to his toes. He states that he thinks the surgical shoe contributed to the superficial ulcer to his posterior [name], [name] ([id number]) heel, he is unable to articulate if this was an area of dry cracked skin as he has a similar area to the left heel. He now is wearing open toed slippers. He has an appointment with Dr. Ola Spurr on 2/11. We have ordered x-rays for her bilateral feet; he was advised to obtain the x-rays today or tomorrow. He will follow-up next week 05/23/17-he is here in follow-up evaluation for multiple ulcerations to multiple toes bilaterally and the right posterior heel. There is improvement in maceration. He has been compliant and not applying any moisturizing agent to his toes. He has been using Lac-Hydrin for his lower extremities with improvement. He did not go to his appointment on Monday with Dr. Ola Spurr secondary to financial concerns. X-rays for her bilateral feet showed: LEFT FOOT with slight erosion of the tuft of the distal phalanges of the left first and second toe suspicious for osteomyelitis, RIGHT FOOT with 1.erosion of the tufts of the distal phalanges of the right first second and possibly third toes consistent with osteomyelitis, 2 no definitive abnormality of the calcaneus is seen on the images obtained, 3. Plantar calcaneal  degenerative spur. We briefly discussed hyperbaric adjunctive therapy for treatment of chronic refractory osteomyelitis. I do not find an a1c in EMR, will contact PCP for record, or order if needed. He has been encouraged to contact Dr Ola Spurr office regarding the follow-up appointment, encouraged him to inquire about payment plan. We will continue with same treatment plan and follow-up next week. He states he is still taking antibiotics and has "a lot" left. He states he has been taking them as directed, 2 pills twice daily. According to Dr. Blane Ohara office notes he was originally started on 12/17 for 4 weeks and extended on 1/14 for an additional 4 weeks. He should be done with his antibiotic therapy, he was advised to bring his bottles and to his next appointment, we will contact pharmacy. 05/30/17-he is here in follow-up evaluation for multiple ulcerations to multiple toes bilaterally and the right posterior heel. He is accompanied by his brother-in-law. Wounds are stable. He has yet to make up with Dr. Ola Spurr. We contacted his PCP, with no record of recent A1c we will draw an A1c. His brother-in-law states that he was taken off all of his diabetic medication secondary to kidney function. 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 The antibiotics were counted and have approximately 2 weeks left, although they should be complete. The pill organizer reveals missing days. We discussed the need for consistent medications, to have optimal benefit of medication. He has a cousin that lives with him and he will ask her to check his organizer daily. He has been advised to follow up with Dr Ola Spurr, and will go by  the office today. He has been advised to quit smoking. 06/06/17-he is here in follow up evaluation. He has had to make an appointment with Dr. Ola Spurr. He did have blood  work obtained, a1c 5. He continues to take antibiotic therapy. Significant improvement in bilateral lower extremity edema with compression therapy. Essentially no change in ulcerations to toes. He states he is "going to try something different" and "let me know next week" if it works; he would not provide any additional information and was encouraged to follow our orders. We will follow up next week 06/13/17-he is here in follow-up evaluation. He has an appointment with Dr. Ola Spurr tomorrow morning. He states he purchased an ointment from [facility] and applied to his toes for 3 days, he does not remember the name of the ointment. There is improvement to his wounds, minimal maceration. He continues to take antibiotic therapy, this should have been completed last month. His brother-in-law who regularly accompanies his appointments was asked to take the bottles to the appointment tomorrow with Dr. Ola Spurr so he is aware. We will continue with 3 layer compression, and order OPEN TOE compression 20-30mmHg; we will apply compression stockings next week. He continues to smoke, smoked "2 cigarettes" last week 06/20/17 on evaluation today patient did receive his compression stockings which he has with him today for both lower extremities. With that being said he tells me at this point in time that he is very happy to have these he really is not a big fan of the compression wraps that we have been utilizing although they have been of great benefit for him. Nonetheless at this point he does want to switch to the compression stockings. In my opinion as long as he is continuing with compression I'm okay with the stockings or the wraps. 06/27/17-he is here in follow-up evaluation for multiple ulcerations to his bilateral toes. There is some improvement in appearance. He is compliant in wearing his compression stockings with significant improvement in lower extremity edema. He saw Dr Ola Spurr on 3/8, per his  notes they would redraw ESR and CRP; plan to continue antibiotic therapy if these remain elevated. I do not see an ESR or CRP level in Epic. The patient continues to take antibiotics. 07/11/17-He is here in follow-up evaluation for multiple ulcerations to multiple toes bilaterally. He presents with complete epithelialization to the right third toe; there has been no deterioration. He continues on antibiotic therapy. He will follow-up next week 07/18/17-He is here in follow-up evaluation for multiple ulcerations to multiple toes bilaterally. He continues to make improvement. He continues on antibiotic therapy. He states he has been using something additional to our orders, he does not elaborate but states he will bring it in next week. 07/25/17-He is here in follow up evaluation for multiple ulcerations to bilateral toes. He is stable. He has completed antibiotic therapy. He admits to "filing" his toes after showers each evening, this is what he was referencing last week; he does not filing for the wounds. We will switch to [facility] and monitor for any improvement, he will follow-up next week 08/01/17-He is here in follow-up evaluation. He admits to "picking" at his toes after cleansing yesterday, leading to new areas of tissue loss on the bilateral second toe. There is improvement noted to the bilateral great toe. We will dress toes today and hope that they maintain until Monday where he will come in for a nurse visit. He has been advised, multiple times with expressed verbalization, to change  the dressings to silvercel if the dressings get wet prior to [facility] appointment. CELIA, GIBBONS ([id number]) 08/08/17-He is here in follow-up evaluation for bilateral first and second toe ulcerations. There is significant improvement to all ulcerations since last visit. We will switch to [facility] to all wounds and he will continue with nurse visits on a Monday/Thursday schedule and follow-up with me in  2 weeks. He continues to smoke, 1-3 cigarettes per day, and has been encouraged to not smoke until his next follow-up in 2 weeks. 08/15/17 on evaluation today patient's ulcers on his toes actually appear to be doing fairly well the right to ulcers may be a little bit more moist compared to the left I'm not really sure exactly why as the openings appear to be very small and I'm not seeing any evidence of anything significant as far as you lightest or otherwise. Nonetheless this may just be a small setback he's been doing very well with the [facility] Dressing 08/22/17-He is here in follow-up evaluation for ulcerations to his bilateral first and second toes, there is small/scant amount of drainage noted on today's dressing. He continues with Hydrofera Blue. He continues to smoke, 1 cigarette a day. Voices no complaint or concerns, compliant and compression therapy today. He'll follow-up next week 08/29/17-He is here in follow-up evaluation for ulcerations to his bilateral first and second toes, with a new wound to the left third toe. He states he cut himself while cutting the toenail. He presents today with more maceration and increased measurements; waxing and waning measurements and moisture has been an ongoing issue. He continues to smoke a proximally 1 cigarette per day, but states he has not smoked since Sunday. We will initiate medihoney daily and evaluate next week. He presents today without compression stockings Electronic Signature(s) Signed: 08/29/2017 10:17:29 AM By: Lawanda Cousins Previous Signature: 08/29/2017 10:15:06 AM Version By: Lawanda Cousins Entered By: Lawanda Cousins on 08/29/2017 10:17:28 Turney, [name] ([id number]) -------------------------------------------------------------------------------- Physician Orders Details Patient Name: Carriker, [name] Date of Service: 08/29/2017 8:45 AM Medical Record Number: [id number] Patient Account Number: [id number] Date of Birth/Sex:  1951-01-10 (66 y.o. M) Treating RN: Roger Shelter Primary Care Provider: Lamonte Sakai Other Clinician: Referring Provider: Lamonte Sakai Treating Provider/Extender: Cathie Olden in Treatment: 2 Verbal / Phone Orders: No Diagnosis Coding Wound Cleansing Wound #1 Right Toe Great o Clean wound with Normal Saline. Wound #2 Right Toe Second o Clean wound with Normal Saline. Wound #5 Left Toe Great o Clean wound with Normal Saline. Wound #6 Left Toe Second o Clean wound with Normal Saline. Anesthetic (add to Medication List) Wound #1 Right Toe Great o Topical Lidocaine 4% cream applied to wound bed prior to debridement (In Clinic Only). Wound #2 Right Toe Second o Topical Lidocaine 4% cream applied to wound bed prior to debridement (In Clinic Only). Wound #5 Left Toe Great o Topical Lidocaine 4% cream applied to wound bed prior to debridement (In Clinic Only). Wound #6 Left Toe Second o Topical Lidocaine 4% cream applied to wound bed prior to debridement (In Clinic Only). Primary Wound Dressing Wound #1 Right Toe Great o Medihoney gel Wound #2 Right Toe Second o Medihoney gel Wound #5 Left Toe Great o Medihoney gel Wound #6 Left Toe Second o Medihoney gel Secondary Dressing Wound #1 Right Toe Great o ABD pad o Dry Gauze [place], [place]wu E. ([id number]) Wound #2 Right Toe Second o ABD pad o Dry Gauze Wound #5 Left Toe Great o ABD pad o  Dry Gauze Wound #6 Left Toe Second o ABD pad o Dry Gauze Dressing Change Frequency Wound #1 Right Toe Great o Change dressing every day. Wound #2 Right Toe Second o Change dressing every day. Wound #5 Left Toe Great o Change dressing every day. Wound #6 Left Toe Second o Change dressing every day. Follow-up Appointments Wound #1 Right Toe Great o Return Appointment in 1 week. Wound #2 Right Toe Second o Return Appointment in 1 week. Wound #5 Left Toe Great o Return  Appointment in 1 week. Wound #6 Left Toe Second o Return Appointment in 1 week. Edema Control Wound #1 Right Toe Great o Patient to wear own compression stockings Wound #2 Right Toe Second o Patient to wear own compression stockings Wound #5 Left Toe Great o Patient to wear own compression stockings Wound #6 Left Toe Second o Patient to wear own compression stockings Electronic Signature(s) Signed: 08/29/2017 12:15:14 PM By: Lawanda Cousins Signed: 08/29/2017 4:33:19 PM By: Jerrell Mylar ([id number]) Entered By: Roger Shelter on 08/29/2017 09:27:40 Guice, [name] ([id number]) -------------------------------------------------------------------------------- Problem List Details Patient Name: Solarz, [name] Date of Service: 08/29/2017 8:45 AM Medical Record Number: [id number] Patient Account Number: [id number] Date of Birth/Sex: 12-20-1950 (67 y.o. M) Treating RN: Roger Shelter Primary Care Provider: Lamonte Sakai Other Clinician: Referring Provider: Lamonte Sakai Treating Provider/Extender: Cathie Olden in Treatment: 22 Active Problems ICD-10 Impacting Encounter Code Description Active Date Wound Healing Diagnosis E11.621 Type 2 diabetes mellitus with foot ulcer 09/21/2016 Yes I87.313 Chronic venous hypertension (idiopathic) with ulcer of 09/21/2016 Yes bilateral lower extremity I89.0 Lymphedema, not elsewhere classified 09/21/2016 Yes L97.522 Non-pressure chronic ulcer of other part of left foot with fat 09/21/2016 Yes layer exposed L97.512 Non-pressure chronic ulcer of other part of right foot with fat 09/21/2016 Yes layer exposed F17.218 Nicotine dependence, cigarettes, with other nicotine-induced 09/21/2016 Yes disorders F10.19 Alcohol abuse with unspecified alcohol-induced disorder 09/21/2016 Yes M86.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, right ankle and foot 02/21/2017 Yes Inactive Problems Resolved Problems Electronic Signature(s) JAKEOB, TULLIS ([id number]) Signed: 08/29/2017 10:09:44 AM By: Lawanda Cousins Entered By: Lawanda Cousins on 08/29/2017 10:09:43 Rubey, [name] ([id number]) -------------------------------------------------------------------------------- Progress Note Details Patient Name: Minion, [name] Date of Service: 08/29/2017 8:45 AM Medical Record Number: [id number] Patient Account Number: [id number] Date of Birth/Sex: [birth year] (67 y.o. M) Treating RN: Roger Shelter Primary Care Provider: Lamonte Sakai Other Clinician: Referring Provider: Lamonte Sakai Treating Provider/Extender: Cathie Olden in Treatment: 28 Subjective Chief Complaint Information obtained from Patient Patient presents for treatment of an open diabetic ulcer to both feet History of Present Illness (HPI) The following HPI elements were documented for the patient's wound: Location: bilateral feet ulceration on the toes Quality: Patient reports experiencing a dull pain to affected area(s). Severity: Patient states wound are getting better Duration: Patient has had the wound for > 3 months prior to seeking treatment at the wound center Timing: Pain in wound is constant (hurts all the time) Context: The wound would happen gradually Modifying Factors: Other treatment(s) tried include:treatment for lymphedema and is seen by the podiatrist Dr. Caryl Comes Associated Signs and Symptoms: Patient reports having increase swelling. 67 year old patient here to see as for bilateral feet ulceration to on his left first and second toe and 2 on his right first and second toe, which she's had for about 4 months. 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 Donella Stade. The patient is also undergoing workup by medical oncology for a intra-abdominal lymphadenopathy. 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 He is also status post appendectomy, inguinal lymph node biopsy, rectal biopsy and rectal examination under anesthesia. he currently smokes cigarettes about half packet a day. In March of this year he was seen by Dr. Hortencia Pilar, for evaluation of bilateral varicose veins and besides wearing compression stockings he had recommended laser ablation of the right and left great saphenous veins to eleviate the symptoms and complications of severe superficial venous reflux disease. He also recommended lymphedema pumps for better control of his lymphedema. The patient recently has had on 08/23/2016, right greater saphenous vein ablation with the laser energy Earlier lower extremity venous reflux examination done on 05/08/2016 showed no DVT or SVT both lower legs but incompetence of bilateral great saphenous veins was present. A lower arterial study was also done and there was no significant right lower and left lower extremity problems based on a normal toe brachial index bilaterally and the ABI was 1.21 the left and 1.23 on the right. His post ablation venous duplex examination showed successful ablation of the right GS vein with thrombus formation 2 below the right saphenofemoral junction. The deep system was patent without evidence of thrombosis and this was done on 08/30/2016. 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 Addendum: regarding his x-rays done today and x-ray of the left foot -- IMPRESSION: No objective evidence of osteomyelitis. There are soft tissue changes which may reflect cellulitis. X-ray of the right foot -- IMPRESSION:Findings compatible with cellulitis of the toes. No objective evidence of osteomyelitis is observed. 10/01/16 on evaluation today patient's wounds appeared  to be doing some better. I did review the x-rays as well which showed no evidence of osteomyelitis although there was evidence on x-ray of cellulitis. He fortunately is not having any discomfort Chevere, Denard E. ([id number]) although he continues to have some swelling. He does not remember being on any antibiotics recently. 10/15/16 on evaluation today patient's wounds overall appear to be doing better although he does have a new location noted on the left foot. Fortunately he is not having significant pain. It almost has the appearance that something is rubbing on the end of his toes but he wears the open toe shoes and according to what he is telling me never wears anything that would rub on his foot. There is no evidence of infection and specifically no evidence of a fungal infection 10/22/16 On evaluation today patient's wounds appeared to be doing better compared to last week in regard to his bilateral lower extremities. Fortunately I happy with how things are progressing although he still has ulcers I feel like that he is improving and appropriate manner. 11/12/16 on evaluation today patient appears to be doing well in regard to his bilateral feet and the respective wounds. We have been using surrounding her dressings along with an antifungal cream which seems to be doing very well. He has no bilateral dysfunction noticed that the rituals are weight loss at this point. He also has no nausea or vomiting a note purulent discharge. He did see Vein and vascular today and he tells me that they told him he could have surgery for his venous stasis but  they did not feel like it was worth it in his words. Fortunately patient's wounds do appear to be getting sneakily better. 11/26/2016 -- he says he is going to have some surgery during this week at [facility] for possibly a colon resection. 12/31/2016 -- the patient has been noncompliant with his smoking and I'm not sure whether he is also started drinking  again. He continues to be very nonchalant about his care 01/14/2017 -- the patient's HandP has been reviewed well and I understand he is being compliant with trying to give up smoking and his local dressing changes. He does not have any surgical options of 4 to him by his vascular surgeons.he was last seen in early August by Dr. Hortencia Pilar who recommended compression stockings,and possibly lymph pumps in 2-3 months after doing a review ultrasound. 01/28/2017 - the patient did not have any fresh complaints but on examination I noted a large lacerated wound on the plantar aspect of his right fourth toe which had a lot of necrotic debris and it probes down to bone. 02/07/2017 -- x-ray of the right foot -- IMPRESSION: Soft tissue swelling about the first through fourth toes consistent with cellulitis. New destructive change in the tuft of the distal phalanx of the great toe is consistent with osteomyelitis. 02/14/2017 -- the patient's MRI is pending this coming Monday and he still continues to smoke. We have again gone over off loading of his wounds in great detail and he says he's been compliant. 02/21/2017 -- MR of the right foot -- IMPRESSION: 1. Soft tissue ulcer at the tip of the first, second and third toe knows. Cortical irregularity and bone marrow edema in the first distal phalanx most concerning for osteomyelitis. Mild marrow edema in the second and third distal phalanx without definite cortical destruction which may reflect early osteomyelitis versus reactive marrow edema. 2. Soft tissue edema surrounding the first phalanx most consistent with cellulitis. the patient was also recently evaluated by his medical oncologist Dr. 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 She is treating him with weekly Procrit. She is also keeping intra-abdominal lymphadenopathy and right lower lobe lung nodule under  observation. 04/04/2017 -- he was seen by Dr. Adrian Prows on 03/25/2017 -- after review he empirically put him on ciprofloxacin and doxycycline as they have good bone penetration and good bioavailability and it will cover the usual pathogens and diabetic foot osteomyelitis. He will check inflammatory markers and plan a 57-26 week old records. C-reactive protein was 0.3 and the ESR was 72 04/18/17 on evaluation today patient appears to be doing about the same in regard to his lower extremity wounds bilaterally. He has continued to use the antifungal cream which does seem to be beneficial. Nonetheless the ulcers do seem to in some areas be epithelial eyes over and in other areas are still open. He is having no significant discomfort. 04/25/17-he is here in follow-up evaluation for multiple ulcerations to multiple toes bilaterally. He states he did see Dr. Ola Spurr again last week and continues antibiotic therapy. He is voicing no complaints or concerns, will continue with current treatment plan will possibility of adding compression therapy next week after an additional week of treatment/lotions to BLE prescribed by Dr Ola Spurr 05/02/17 he is here in follow up for for multiple ulcers to multiple toes bilaterally. we will stop using antifungal cream and will continue with silvercel and follow up next week [name], [name] (  [id number]) 05/09/17-he is here in follow-up for multiple ulcerations to multiple toes bilaterally. There is improvement in appearance. He has not completely stopped using antifungal cream, but admits he has not using it between the toes. He has an appointment with Dr. Ola Spurr on 2/11, continues on doxycycline and Cipro. It has been 5 weeks of antibiotic therapy, we will order plain film xray to evaluate for osteomyelitis next week, prior to follow up with ID. Will continue with silvercel and follow up next week 05/16/17-he is here in follow-up evaluation for multiple ulcerations  to multiple toes bilaterally and new wound to the right posterior heel. There is essentially no change in appearance, deteriorating measurements; he has a history of waxing and waning measurements. He admits that he continues to apply moisturizer/cream/ointment to his toes despite weekly reminders to only apply silvercel to his toes. He states that he thinks the surgical shoe contributed to the superficial ulcer to his posterior heel, he is unable to articulate if this was an area of dry cracked skin as he has a similar area to the left heel. He now is wearing open toed slippers. He has an appointment with Dr. Ola Spurr on 2/11. We have ordered x-rays for her bilateral feet; he was advised to obtain the x-rays today or tomorrow. He will follow-up next week 05/23/17-he is here in follow-up evaluation for multiple ulcerations to multiple toes bilaterally and the right posterior heel. There is improvement in maceration. He has been compliant and not applying any moisturizing agent to his toes. He has been using Lac-Hydrin for his lower extremities with improvement. He did not go to his appointment on Monday with Dr. Ola Spurr secondary to financial concerns. X-rays for her bilateral feet showed: LEFT FOOT with slight erosion of the tuft of the distal phalanges of the left first and second toe suspicious for osteomyelitis, RIGHT FOOT with 1.erosion of the tufts of the distal phalanges of the right first second and possibly third toes consistent with osteomyelitis, 2 no definitive abnormality of the calcaneus is seen on the images obtained, 3. Plantar calcaneal degenerative spur. We briefly discussed hyperbaric adjunctive therapy for treatment of chronic refractory osteomyelitis. I do not find an a1c in EMR, will contact PCP for record, or order if needed. He has been encouraged to contact Dr Ola Spurr office regarding the follow-up appointment, encouraged him to inquire about payment plan. We will  continue with same treatment plan and follow-up next week. He states he is still taking antibiotics and has "a lot" left. He states he has been taking them as directed, 2 pills twice daily. According to Dr. Blane Ohara office notes he was originally started on 12/17 for 4 weeks and extended on 1/14 for an additional 4 weeks. He should be done with his antibiotic therapy, he was advised to bring his bottles and to his next appointment, we will contact pharmacy. 05/30/17-he is here in follow-up evaluation for multiple ulcerations to multiple toes bilaterally and the right posterior heel. He is accompanied by his brother-in-law. Wounds are stable. He has yet to make up with Dr. Ola Spurr. We contacted his PCP, with no record of recent A1c we will draw an A1c. His brother-in-law states that he was taken off all of his diabetic medication secondary to kidney function. 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 The antibiotics were counted and have approximately 2 weeks left, although they should be complete. The pill organizer reveals  missing days. We discussed the need for consistent medications, to have optimal benefit of medication. He has a cousin that lives with him and he will ask her to check his organizer daily. He has been advised to follow up with Dr Ola Spurr, and will go by the office today. He has been advised to quit smoking. 06/06/17-he is here in follow up evaluation. He has had to make an appointment with Dr. Ola Spurr. He did have blood work obtained, a1c 5. He continues to take antibiotic therapy. Significant improvement in bilateral lower extremity edema with compression therapy. Essentially no change in ulcerations to toes. He states he is "going to try something different" and "let me know next week" if it works; he would not provide any additional information and was encouraged to  follow our orders. We will follow up next week 06/13/17-he is here in follow-up evaluation. He has an appointment with Dr. Ola Spurr tomorrow morning. He states he purchased an ointment from [facility] and applied to his toes for 3 days, he does not remember the name of the ointment. There is improvement to his wounds, minimal maceration. He continues to take antibiotic therapy, this should have been completed last month. His brother-in-law who regularly accompanies his appointments was asked to take the bottles to the appointment tomorrow with Dr. Ola Spurr so he is aware. We will continue with 3 layer compression, and order OPEN TOE compression 20-30mmHg; we will apply compression stockings next week. He continues to smoke, smoked "2 cigarettes" last week 06/20/17 on evaluation today patient did receive his compression stockings which he has with him today for both lower extremities. With that being said he tells me at this point in time that he is very happy to have these he really is not a big fan of the compression wraps that we have been utilizing although they have been of great benefit for him. Nonetheless at this point he does want to switch to the compression stockings. In my opinion as long as he is continuing with compression I'm okay with the stockings or the wraps. 06/27/17-he is here in follow-up evaluation for multiple ulcerations to his bilateral toes. There is some improvement in appearance. He is compliant in wearing his compression stockings with significant improvement in lower extremity edema. He saw Dr Ola Spurr on 3/8, per his notes they would redraw ESR and CRP; plan to continue antibiotic therapy if these remain elevated. I do not see an ESR or CRP level in Epic. The patient continues to take antibiotics. 07/11/17-He is here in follow-up evaluation for multiple ulcerations to multiple toes bilaterally. He presents with complete epithelialization to the right third toe; there  has been no deterioration. He continues on antibiotic therapy. He will follow-up next week 07/18/17-He is here in follow-up evaluation for multiple ulcerations to multiple toes bilaterally. He continues to make improvement. He continues on antibiotic therapy. He states he has been using something additional to our orders, he does Byland, Theordore E. ([id number]) not elaborate but states he will bring it in next week. 07/25/17-He is here in follow up evaluation for multiple ulcerations to bilateral toes. He is stable. He has completed antibiotic therapy. He admits to "filing" his toes after showers each evening, this is what he was referencing last week; he does not filing for the wounds. We will switch to [facility] and monitor for any improvement, he will follow-up next week 08/01/17-He is here in follow-up evaluation. He admits to "picking" at his toes after cleansing yesterday, leading to  new areas of tissue loss on the bilateral second toe. There is improvement noted to the bilateral great toe. We will dress toes today and hope that they maintain until Monday where he will come in for a nurse visit. He has been advised, multiple times with expressed verbalization, to change the dressings to silvercel if the dressings get wet prior to [facility] appointment. 08/08/17-He is here in follow-up evaluation for bilateral first and second toe ulcerations. There is significant improvement to all ulcerations since last visit. We will switch to [facility] to all wounds and he will continue with nurse visits on a Monday/Thursday schedule and follow-up with me in 2 weeks. He continues to smoke, 1-3 cigarettes per day, and has been encouraged to not smoke until his next follow-up in 2 weeks. 08/15/17 on evaluation today patient's ulcers on his toes actually appear to be doing fairly well the right to ulcers may be a little bit more moist compared to the left I'm not really sure exactly why as the openings appear  to be very small and I'm not seeing any evidence of anything significant as far as you lightest or otherwise. Nonetheless this may just be a small setback he's been doing very well with the [facility] Dressing 08/22/17-He is here in follow-up evaluation for ulcerations to his bilateral first and second toes, there is small/scant amount of drainage noted on today's dressing. He continues with Hydrofera Blue. He continues to smoke, 1 cigarette a day. Voices no complaint or concerns, compliant and compression therapy today. He'll follow-up next week 08/29/17-He is here in follow-up evaluation for ulcerations to his bilateral first and second toes, with a new wound to the left third toe. He states he cut himself while cutting the toenail. He presents today with more maceration and increased measurements; waxing and waning measurements and moisture has been an ongoing issue. He continues to smoke a proximally 1 cigarette per day, but states he has not smoked since Sunday. We will initiate medihoney daily and evaluate next week. He presents today without compression stockings Patient History Information obtained from Patient. Social History Current every day smoker, Marital Status - Widowed, Alcohol Use - Daily - quit drinking about a week ago, Drug Use - No History, Caffeine Use - Moderate. Medical And Surgical History Notes Oncologic 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 Objective Constitutional Vitals Time Taken: 9:05 AM, Height: 69 in, Weight: 168 lbs, BMI: 24.8, Temperature: 98.0 F, Pulse: 64 bpm, Respiratory Rate: 16 breaths/min, Blood Pressure: 176/86 mmHg. Integumentary (Hair, Skin) Wound #1 status is Open. Original cause of wound was Gradually Appeared. The wound is located on the Right Toe Great. The wound measures 0.3cm length x 0.5cm width x 0.1cm depth; 0.118cm^2 area and 0.012cm^3 volume. There is Fat Layer (Subcutaneous Tissue) Exposed exposed. There is  no tunneling or undermining noted. There is a medium amount of serosanguineous drainage noted. The wound margin is indistinct and nonvisible. There is large (67-100%) red granulation [name], [name] ([id number]) within the wound bed. There is a small (1-33%) amount of necrotic tissue within the wound bed including Adherent Slough. The periwound skin appearance did not exhibit: Callus, Crepitus, Excoriation, Induration, Rash, Scarring, Dry/Scaly, Maceration, Atrophie Blanche, Cyanosis, Ecchymosis, Hemosiderin Staining, Mottled, Pallor, Rubor, Erythema. Periwound temperature was noted as No Abnormality. Wound #13 status is Open. Original cause of wound was trauma. The wound is located on the Left Toe Third. The wound measures 0.1cm length x 0.1cm width x 0.1cm depth;  0.008cm^2 area and 0.001cm^3 volume. Wound #2 status is Open. Original cause of wound was Gradually Appeared. The wound is located on the Right Toe Second. The wound measures 0.7cm length x 0.3cm width x 0.1cm depth; 0.165cm^2 area and 0.016cm^3 volume. There is no tunneling or undermining noted. There is a small amount of serous drainage noted. The wound margin is flat and intact. There is large (67-100%) red granulation within the wound bed. There is no necrotic tissue within the wound bed. The periwound skin appearance did not exhibit: Callus, Crepitus, Excoriation, Induration, Rash, Scarring, Dry/Scaly, Maceration, Atrophie Blanche, Cyanosis, Ecchymosis, Hemosiderin Staining, Mottled, Pallor, Rubor, Erythema. Periwound temperature was noted as No Abnormality. Wound #5 status is Open. Original cause of wound was Gradually Appeared. The wound is located on the Left Toe Great. The wound measures 0.1cm length x 0.1cm width x 0.1cm depth; 0.008cm^2 area and 0.001cm^3 volume. There is no tunneling or undermining noted. There is a medium amount of serous drainage noted. The wound margin is flat and intact. There is large (67-100%) red  granulation within the wound bed. There is no necrotic tissue within the wound bed. The periwound skin appearance did not exhibit: Callus, Crepitus, Excoriation, Induration, Rash, Scarring, Dry/Scaly, Maceration, Atrophie Blanche, Cyanosis, Ecchymosis, Hemosiderin Staining, Mottled, Pallor, Rubor, Erythema. Periwound temperature was noted as No Abnormality. Wound #6 status is Open. Original cause of wound was Gradually Appeared. The wound is located on the Left Toe Second. The wound measures 0.8cm length x 0.2cm width x 0.1cm depth; 0.126cm^2 area and 0.013cm^3 volume. There is no tunneling or undermining noted. There is a medium amount of serous drainage noted. The wound margin is flat and intact. There is large (67-100%) red granulation within the wound bed. There is no necrotic tissue within the wound bed. The periwound skin appearance did not exhibit: Callus, Crepitus, Excoriation, Induration, Rash, Scarring, Dry/Scaly, Maceration, Atrophie Blanche, Cyanosis, Ecchymosis, Hemosiderin Staining, Mottled, Pallor, Rubor, Erythema. Periwound temperature was noted as No Abnormality. Assessment Active Problems ICD-10 E11.621 - Type 2 diabetes mellitus with foot ulcer I87.313 - Chronic venous hypertension (idiopathic) with ulcer of bilateral lower extremity I89.0 - Lymphedema, not elsewhere classified L97.522 - Non-pressure chronic ulcer of other part of left foot with fat layer exposed L97.512 - Non-pressure chronic ulcer of other part of right foot with fat layer exposed F17.218 - Nicotine dependence, cigarettes, with other nicotine-induced disorders F10.19 - Alcohol abuse 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 - Chronic multifocal osteomyelitis, right ankle and foot Procedures Wound #1 Pre-procedure diagnosis of Wound #1 is a Diabetic Wound/Ulcer of the Lower Extremity located on the Right Toe [name], [name] ([id number]) Great .Severity of Tissue Pre Debridement is: Limited to  breakdown of skin. There was a Selective/Open Wound Skin/Epidermis Debridement with a total area of 0.15 sq cm performed by Lawanda Cousins, NP. With the following instrument(s): Curette to remove Viable and Non-Viable tissue/material. Material removed includes Callus, Skin: Dermis, Skin: Epidermis, and Biofilm after achieving pain control using Other (lidocaine 4%). No specimens were taken. A time out was conducted at 09:23, prior to the start of the procedure. A Minimum amount of bleeding was controlled with Pressure. The procedure was tolerated well with a pain level of 0 throughout and a pain level of 0 following the procedure. Patient s Level of Consciousness post procedure was recorded as Awake and Alert and post-procedure vitals were taken including Temperature: 98.0 F, Pulse: 64 bpm, Respiratory Rate: 16 breaths/min, Blood Pressure: (176)/(86) mmHg. Post Debridement Measurements: 1cm length x  1cm width x 0.1cm depth; 0.079cm^3 volume. Character of Wound/Ulcer Post Debridement is stable. Severity of Tissue Post Debridement is: Limited to breakdown of skin. Post procedure Diagnosis Wound #1: Same as Pre-Procedure Plan Wound Cleansing: Wound #1 Right Toe Great: Clean wound with Normal Saline. Wound #2 Right Toe Second: Clean wound with Normal Saline. Wound #5 Left Toe Great: Clean wound with Normal Saline. Wound #6 Left Toe Second: Clean wound with Normal Saline. Anesthetic (add to Medication List): Wound #1 Right Toe Great: Topical Lidocaine 4% cream applied to wound bed prior to debridement (In Clinic Only). Wound #2 Right Toe Second: Topical Lidocaine 4% cream applied to wound bed prior to debridement (In Clinic Only). Wound #5 Left Toe Great: Topical Lidocaine 4% cream applied to wound bed prior to debridement (In Clinic Only). Wound #6 Left Toe Second: Topical Lidocaine 4% cream applied to wound bed prior to debridement (In Clinic Only). Primary Wound Dressing: Wound #1 Right  Toe Great: Medihoney gel Wound #2 Right Toe Second: Medihoney gel Wound #5 Left Toe Great: Medihoney gel Wound #6 Left Toe Second: Medihoney gel Secondary Dressing: Wound #1 Right Toe Great: ABD pad Dry Gauze Wound #2 Right Toe Second: ABD pad Dry Gauze Wound #5 Left Toe Great: ABD pad Dry Gauze Wound #6 Left Toe Second: ABD pad Dry Gauze Chisom, Nyxon E. ([id number]) Dressing Change Frequency: Wound #1 Right Toe Great: Change dressing every day. Wound #2 Right Toe Second: Change dressing every day. Wound #5 Left Toe Great: Change dressing every day. Wound #6 Left Toe Second: Change dressing every day. Follow-up Appointments: Wound #1 Right Toe Great: Return Appointment in 1 week. Wound #2 Right Toe Second: Return Appointment in 1 week. Wound #5 Left Toe Great: Return Appointment in 1 week. Wound #6 Left Toe Second: Return Appointment in 1 week. Edema Control: Wound #1 Right Toe Great: Patient to wear own compression stockings Wound #2 Right Toe Second: Patient to wear own compression stockings Wound #5 Left Toe Great: Patient to wear own compression stockings Wound #6 Left Toe Second: Patient to wear own compression stockings Electronic Signature(s) Signed: 08/30/2017 8:18:28 AM By: Lawanda Cousins Previous Signature: 08/29/2017 10:17:53 AM Version By: Lawanda Cousins Previous Signature: 08/29/2017 10:16:02 AM Version By: Lawanda Cousins Entered By: Lawanda Cousins on 08/30/2017 08:18:28 Holbein, [name] ([id number]) -------------------------------------------------------------------------------- ROS/PFSH Details Patient Name: Yingst, Kamarie E. Date of Service: 08/29/2017 8:45 AM Medical Record Number: [id number] Patient Account Number: [id number] Date of Birth/Sex: 04/01/1951 (67 y.o. M) Treating RN: Roger Shelter Primary Care Provider: Lamonte Sakai Other Clinician: Referring Provider: Lamonte Sakai Treating Provider/Extender: Cathie Olden in Treatment:  45 Information Obtained From Patient Wound History Do you currently have one or more open woundso Yes How many open wounds do you currently haveo 6 Approximately how long have you had your woundso 3 months How have you been treating your wound(s) until nowo ointment and bandage Has your wound(s) ever healed and then re-openedo No Have you had any lab work done in the past montho No Have you tested positive for an antibiotic resistant organism (MRSA, VRE)o No Have you tested positive for osteomyelitis (bone infection)o No Have you had any tests for circulation on your legso Yes Who ordered the testo PCP Where was the test doneo [facility] Eyes Medical History: Negative for: Cataracts; Glaucoma; Optic Neuritis Ear/Nose/Mouth/Throat Medical History: Negative for: Chronic sinus problems/congestion; Middle ear problems Hematologic/Lymphatic Medical History: Positive for: Anemia; Lymphedema Negative for: Hemophilia; Human Immunodeficiency Virus; Sickle Cell Disease Respiratory Medical History: Negative  for: Aspiration; Asthma; Chronic Obstructive Pulmonary Disease (COPD); Pneumothorax; Sleep Apnea; Tuberculosis Cardiovascular Medical History: Positive for: Congestive Heart Failure; Hypertension; Peripheral Venous Disease Negative for: Angina; Arrhythmia; Coronary Artery Disease; Deep Vein Thrombosis; Hypotension; Myocardial Infarction; Peripheral Arterial Disease; Phlebitis; Vasculitis Gastrointestinal Medical History: Negative for: Cirrhosis ; Colitis; Crohnos; Hepatitis A; Hepatitis B; Hepatitis C Daudelin, Muzammil E. ([id number]) Endocrine Medical History: Positive for: Type II Diabetes Treated with: Oral agents Blood sugar tested every day: Yes Tested : QD Genitourinary Medical History: Negative for: End Stage Renal Disease Immunological Medical History: Negative for: Lupus Erythematosus; Raynaudos; Scleroderma Integumentary (Skin) Medical History: Negative for: History of Burn;  History of pressure wounds Musculoskeletal Medical History: Negative for: Gout; Rheumatoid Arthritis; Osteoarthritis; Osteomyelitis Neurologic Medical History: Positive for: Neuropathy Negative for: Dementia; Quadriplegia; Paraplegia; Seizure Disorder Oncologic Medical History: Past Medical History Notes: 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 Electronic Signature(s) Signed: 08/29/2017 12:15:14 PM By: Lawanda Cousins Signed: 08/29/2017 4:33:19 PM By: Jerrell Mylar ([id number]) Entered By: Lawanda Cousins on 08/29/2017 10:15:15 Kreuser, [name] ([id number]) -------------------------------------------------------------------------------- SuperBill Details Patient Name: Chlebowski, Boaz E. Date of Service: 08/29/2017 Medical Record Number: [id number] Patient Account Number: [id number] Date of Birth/Sex: January 02, 1951 (68 y.o. 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 F17.218 Nicotine dependence, cigarettes, 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 abuse 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, right ankle and foot Facility Procedures CPT4 Code: [id number] Description: 33295 - DEBRIDE WOUND 1ST 20 SQ CM OR < ICD-10 Diagnosis Description L97.512 Non-pressure chronic ulcer of other part of right foot with fat la Modifier: yer exposed Quantity: 1 Physician Procedures CPT4 Code: 1884166 Description: 97597 - WC PHYS DEBR WO ANESTH 20 SQ CM ICD-10 Diagnosis Description L97.512 Non-pressure chronic ulcer of other part of right foot with fat la Modifier: yer exposed Quantity: 1 Electronic Signature(s) Signed: 08/29/2017 10:16:18 AM By: Lawanda Cousins Entered By: Lawanda Cousins on 08/29/2017 10:16:17

## 2017-08-31 NOTE — Progress Notes (Signed)
COLSTON, PYLE ([id number]) Visit Report for 08/29/2017 Arrival Information Details Patient Name: NORMA, IGNASIAK. Date of Service: 08/29/2017 8:45 AM Medical Record Number: [id number] Patient Account Number: [id number] Date of Birth/Sex: Jun 28, 1950 (67 y.o. M) Treating RN: Montey Hora Primary Care Kinlie Janice: Lamonte Sakai Other Clinician: Referring Jaymian Bogart: Lamonte Sakai Treating Benjie Ricketson/Extender: Cathie Olden in Treatment: 37 Visit Information History Since Last Visit Added or deleted any medications: No Patient Arrived: Ambulatory Any new allergies or adverse reactions: No Arrival Time: 08:55 Had a fall or experienced change in No Accompanied By: brother in activities of daily living that may affect law risk of falls: Transfer Assistance: None Signs or symptoms of abuse/neglect since last visito No Patient Identification Verified: Yes Hospitalized since last visit: No Secondary Verification Process Completed: Yes Implantable device outside of the clinic excluding No Patient Requires Transmission-Based No cellular tissue based products placed in the center Precautions: since last visit: Patient Has Alerts: Yes Has Dressing in Place as Prescribed: Yes Patient Alerts: DMII Has Compression in Place as Prescribed: Yes Pain Present Now: No Electronic Signature(s) Signed: 08/29/2017 5:31:29 PM By: Montey Hora Entered By: Montey Hora on 08/29/2017 08:55:45 Strebeck, [name] ([id number]) -------------------------------------------------------------------------------- Encounter Discharge Information Details Patient Name: Cozad, Dolph E. Date of Service: 08/29/2017 8:45 AM Medical Record Number: [id number] Patient Account Number: [id number] Date of Birth/Sex: 1950/12/31 (67 y.o. M) Treating RN: Cornell Barman Primary Care Jack Mineau: Lamonte Sakai Other Clinician: Referring Merek Niu: Lamonte Sakai Treating Johnna Bollier/Extender: Cathie Olden in Treatment: 45 Encounter  Discharge Information Items Discharge Condition: Stable Ambulatory Status: Ambulatory Discharge Destination: Home Transportation: Private Auto Accompanied By: friend Schedule Follow-up Appointment: Yes Clinical Summary of Care: Patient Declined Electronic Signature(s) Signed: 08/29/2017 5:02:07 PM By: Gretta Cool, BSN, RN, CWS, Kim RN, BSN Entered By: Gretta Cool, BSN, RN, CWS, Kim on 08/29/2017 09:44:46 Eich, [name] ([id number]) -------------------------------------------------------------------------------- Lower Extremity Assessment Details Patient Name: Minshall, Lymon E. Date of Service: 08/29/2017 8:45 AM Medical Record Number: [id number] Patient Account Number: [id number] Date of Birth/Sex: [birth year] (67 y.o. M) Treating RN: Montey Hora Primary Care Athene Schuhmacher: Lamonte Sakai Other Clinician: Referring Ola Raap: Lamonte Sakai Treating Anniston Nellums/Extender: Cathie Olden in Treatment: 48 Edema Assessment Assessed: [Left: No] [Right: No] Edema: [Left: Yes] [Right: Yes] Calf Left: Right: Point of Measurement: 34 cm From Medial Instep 36.1 cm 36.3 cm Ankle Left: Right: Point of Measurement: 12 cm From Medial Instep 24.2 cm 24.1 cm Vascular Assessment Pulses: Dorsalis Pedis Palpable: [Left:Yes] [Right:Yes] Posterior Tibial Extremity colors, hair growth, and conditions: Extremity Color: [Left:Hyperpigmented] [Right:Hyperpigmented] Hair Growth on Extremity: [Left:No] [Right:No] Temperature of Extremity: [Left:Warm] [Right:Warm] Capillary Refill: [Left:< 3 seconds] [Right:< 3 seconds] Toe Nail Assessment Left: Right: Thick: Yes Yes Discolored: Yes Yes Deformed: Yes Yes Improper Length and Hygiene: No No Electronic Signature(s) Signed: 08/29/2017 5:31:29 PM By: Montey Hora Entered By: Montey Hora on 08/29/2017 09:05:17 Crooker, [name] ([id number]) -------------------------------------------------------------------------------- Multi Wound Chart Details Patient Name: Hults,  Pinchus E. Date of Service: 08/29/2017 8:45 AM Medical Record Number: [id number] Patient Account Number: [id number] Date of Birth/Sex: Feb 10, 1951 (67 y.o. M) Treating RN: Roger Shelter Primary Care Alisen Marsiglia: Lamonte Sakai Other Clinician: Referring Marrianne Sica: Lamonte Sakai Treating Saanvi Hakala/Extender: Cathie Olden in Treatment: 48 Vital Signs Height(in): 30 Pulse(bpm): 60 Weight(lbs): 168 Blood Pressure(mmHg): 176/86 Body Mass Index(BMI): 25 Temperature(F): 98.0 Respiratory Rate 16 (breaths/min): Photos: [1:No Photos] [2:No Photos] [5:No Photos] Wound Location: [1:Right Toe Great] [2:Right Toe Second] [5:Left Toe Great] Wounding Event: [1:Gradually Appeared] [2:Gradually Appeared] [5:Gradually Appeared] Primary Etiology: [1:Diabetic Wound/Ulcer of the Lower Extremity] [  2:Diabetic Wound/Ulcer of the Lower Extremity] [5:Diabetic Wound/Ulcer of the Lower Extremity] Comorbid History: [1:Anemia, Lymphedema, Congestive Heart Failure, Hypertension, Peripheral Venous Disease, Type II Diabetes, Neuropathy] [2:Anemia, Lymphedema, Congestive Heart Failure, Hypertension, Peripheral Venous Disease, Type II Diabetes,  Neuropathy] [5:Anemia, Lymphedema, Congestive Heart Failure, Hypertension, Peripheral Venous Disease, Type II Diabetes, Neuropathy] Date Acquired: [1:06/11/2016] [2:06/11/2016] [5:06/11/2016] Weeks of Treatment: [1:48] [2:48] [5:48] Wound Status: [1:Open] [2:Open] [5:Open] Pending Amputation on [1:Yes] [2:Yes] [5:Yes] Presentation: Measurements L x W x D [1:0.3x0.5x0.1] [2:0.7x0.3x0.1] [5:0.1x0.1x0.1] (cm) Area (cm) : [1:0.118] [2:0.165] [5:0.008] Volume (cm) : [1:0.012] [2:0.016] [5:0.001] % Reduction in Area: [1:99.70%] [2:94.60%] [5:99.90%] % Reduction in Volume: [1:99.70%] [2:94.80%] [5:99.90%] Classification: [1:Grade 2] [2:Grade 2] [5:Grade 2] Exudate Amount: [1:Medium] [2:Small] [5:Medium] Exudate Type: [1:Serosanguineous] [2:Serous] [5:Serous] Exudate Color: [1:red,  brown] [2:amber] [5:amber] Wound Margin: [1:Indistinct, nonvisible] [2:Flat and Intact] [5:Flat and Intact] Granulation Amount: [1:Large (67-100%)] [2:Large (67-100%)] [5:Large (67-100%)] Granulation Quality: [1:Red] [2:Red] [5:Red] Necrotic Amount: [1:Small (1-33%)] [2:None Present (0%)] [5:None Present (0%)] Exposed Structures: [1:Fat Layer (Subcutaneous Tissue) Exposed: Yes Fascia: No Tendon: No Muscle: No Joint: No Bone: No] [2:Fascia: No Fat Layer (Subcutaneous Tissue) Exposed: No Tendon: No Muscle: No Joint: No Bone: No] [5:Fascia: No Fat Layer (Subcutaneous Tissue)  Exposed: No Tendon: No Muscle: No Joint: No Bone: No] Epithelialization: [1:Medium (34-66%)] [2:Large (67-100%)] [5:None] Debridement: Debridement - Selective/Open Debridement - Excisional N/A Wound Pre-procedure 09:23 09:23 N/A Verification/Time Out Taken: Pain Control: Other Other N/A Tissue Debrided: Callus Callus, Subcutaneous, Slough N/A Level: Skin/Epidermis Skin/Subcutaneous Tissue N/A Debridement Area (sq cm): 0.15 0.21 N/A Instrument: Curette Curette N/A Bleeding: Minimum Minimum N/A Hemostasis Achieved: Pressure Pressure N/A Procedural Pain: 0 0 N/A Post Procedural Pain: 0 0 N/A Debridement Treatment Procedure was tolerated well Procedure was tolerated well N/A Response: Post Debridement 1x1x0.1 0.7x0.3x0.2 N/A Measurements L x W x D (cm) Post Debridement Volume: 0.079 0.033 N/A (cm) Periwound Skin Texture: Excoriation: No Excoriation: No Excoriation: No Induration: No Induration: No Induration: No Callus: No Callus: No Callus: No Crepitus: No Crepitus: No Crepitus: No Rash: No Rash: No Rash: No Scarring: No Scarring: No Scarring: No Periwound Skin Moisture: Maceration: No Maceration: No Maceration: No Dry/Scaly: No Dry/Scaly: No Dry/Scaly: No Periwound Skin Color: Atrophie Blanche: No Atrophie Blanche: No Atrophie Blanche: No Cyanosis: No Cyanosis: No Cyanosis:  No Ecchymosis: No Ecchymosis: No Ecchymosis: No Erythema: No Erythema: No Erythema: No Hemosiderin Staining: No Hemosiderin Staining: No Hemosiderin Staining: No Mottled: No Mottled: No Mottled: No Pallor: No Pallor: No Pallor: No Rubor: No Rubor: No Rubor: No Temperature: No Abnormality No Abnormality No Abnormality Tenderness on Palpation: No No No Wound Preparation: Ulcer Cleansing: Ulcer Cleansing: Ulcer Cleansing: Rinsed/Irrigated with Saline Rinsed/Irrigated with Saline Rinsed/Irrigated with Saline Topical Anesthetic Applied: Topical Anesthetic Applied: Topical Anesthetic Applied: Other: lidocaine 4% Other: lidocaine 4% Other: lidocaine 4% Procedures Performed: N/A Debridement N/A Wound Number: 6 N/A N/A Photos: No Photos N/A N/A Wound Location: Left Toe Second N/A N/A Wounding Event: Gradually Appeared N/A N/A Primary Etiology: Diabetic Wound/Ulcer of the N/A N/A Lower Extremity Comorbid History: Anemia, Lymphedema, N/A N/A Congestive Heart Failure, Hypertension, Peripheral Venous Disease, Type II Diabetes, Neuropathy Date Acquired: 06/11/2016 N/A N/A Weeks of Treatment: 48 N/A N/A Kramlich, Jessy E. ([id number]) Wound Status: Open N/A N/A Pending Amputation on Yes N/A N/A Presentation: Measurements L x W x D 0.8x0.2x0.1 N/A N/A (cm) Area (cm) : 0.126 N/A N/A Volume (cm) : 0.013 N/A N/A % Reduction in Area: 94.10% N/A N/A % Reduction in Volume: 93.90% N/A N/A  Classification: Grade 2 N/A N/A Exudate Amount: Medium N/A N/A Exudate Type: Serous N/A N/A Exudate Color: amber N/A N/A Wound Margin: Flat and Intact N/A N/A Granulation Amount: Large (67-100%) N/A N/A Granulation Quality: Red N/A N/A Necrotic Amount: None Present (0%) N/A N/A Exposed Structures: Fascia: No N/A N/A Fat Layer (Subcutaneous Tissue) Exposed: No Tendon: No Muscle: No Joint: No Bone: No Epithelialization: Large (67-100%) N/A N/A Debridement: N/A N/A N/A Pain Control: N/A  N/A N/A Tissue Debrided: N/A N/A N/A Level: N/A N/A N/A Debridement Area (sq cm): N/A N/A N/A Instrument: N/A N/A N/A Bleeding: N/A N/A N/A Hemostasis Achieved: N/A N/A N/A Procedural Pain: N/A N/A N/A Post Procedural Pain: N/A N/A N/A Debridement Treatment N/A N/A N/A Response: Post Debridement N/A N/A N/A Measurements L x W x D (cm) Post Debridement Volume: N/A N/A N/A (cm) Periwound Skin Texture: Excoriation: No N/A N/A Induration: No Callus: No Crepitus: No Rash: No Scarring: No Periwound Skin Moisture: Maceration: No N/A N/A Dry/Scaly: No Periwound Skin Color: Atrophie Blanche: No N/A N/A Cyanosis: No Ecchymosis: No Erythema: No Hemosiderin Staining: No Mottled: No Krempasky, Cleatus E. ([id number]) Pallor: No Rubor: No Temperature: No Abnormality N/A N/A Tenderness on Palpation: No N/A N/A Wound Preparation: Ulcer Cleansing: N/A N/A Rinsed/Irrigated with Saline Topical Anesthetic Applied: Other: lidocaine 4% Procedures Performed: N/A N/A N/A Treatment Notes Wound #1 (Right Toe Great) 1. Cleansed with: Clean wound with Normal Saline 2. Anesthetic Topical Lidocaine 4% cream to wound bed prior to debridement 4. Dressing Applied: Medihoney Gel 5. Secondary Dressing Applied ABD and Kerlix/Conform 6. Footwear/Offloading device applied Surgical shoe 7. Secured with Other (specify in notes) Notes [facility] Wound #2 (Right Toe Second) 1. Cleansed with: Clean wound with Normal Saline 2. Anesthetic Topical Lidocaine 4% cream to wound bed prior to debridement 4. Dressing Applied: Medihoney Gel 5. Secondary Dressing Applied ABD and Kerlix/Conform 6. Footwear/Offloading device applied Surgical shoe 7. Secured with Other (specify in notes) Notes [facility] Wound #5 (Left Toe Great) 1. Cleansed with: Clean wound with Normal Saline 2. Anesthetic Topical Lidocaine 4% cream to wound bed prior to debridement 4. Dressing Applied: Medihoney Gel 5. Secondary  Dressing Applied Huang, Carnell E. ([id number]) ABD and Kerlix/Conform 6. Footwear/Offloading device applied Surgical shoe 7. Secured with Other (specify in notes) Notes [facility] Wound #6 (Left Toe Second) 1. Cleansed with: Clean wound with Normal Saline 2. Anesthetic Topical Lidocaine 4% cream to wound bed prior to debridement 4. Dressing Applied: Medihoney Gel 5. Secondary Dressing Applied ABD and Kerlix/Conform 6. Footwear/Offloading device applied Surgical shoe 7. Secured with Other (specify in notes) Notes [facility] Electronic Signature(s) Signed: 08/29/2017 10:09:58 AM By: Lawanda Cousins Entered By: Lawanda Cousins on 08/29/2017 10:09:58 Mccart, [name] ([id number]) -------------------------------------------------------------------------------- [place] Details Patient Name: Nelms, Juanmiguel E. Date of Service: 08/29/2017 8:45 AM Medical Record Number: [id number] Patient Account Number: [id number] Date of Birth/Sex: 16-Nov-1950 (67 y.o. M) Treating RN: Roger Shelter Primary Care Antavius Sperbeck: Lamonte Sakai Other Clinician: Referring Aayla Marrocco: Lamonte Sakai Treating Arish Redner/Extender: Cathie Olden in Treatment: 56 Active Inactive ` Abuse / Safety / Falls / Self Care Management Nursing Diagnoses: Potential for falls Goals: Patient will remain injury free related to falls Date Initiated: 09/21/2016 Target Resolution Date: 09/14/2017 Goal Status: Active Interventions: Assess fall risk on admission and as needed Notes: ` Nutrition Nursing Diagnoses: Potential for alteratiion in Nutrition/Potential for imbalanced nutrition Goals: Patient/caregiver agrees to and verbalizes understanding of need to use nutritional supplements and/or vitamins as prescribed Date Initiated: 09/21/2016 Target Resolution Date: 09/14/2017  Goal Status: Active Interventions: Assess patient nutrition upon admission and as needed per policy Notes: ` Orientation to the Wound Care  Program Nursing Diagnoses: Knowledge deficit related to the wound healing center program Goals: Patient/caregiver will verbalize understanding of the [facility] Date Initiated: 09/21/2016 Target Resolution Date: 06/15/2017 Goal Status: Active Interventions: Deharo, HARRISON ZETINA ([id number]) Provide education on orientation to the wound center Notes: ` Wound/Skin Impairment Nursing Diagnoses: Knowledge deficit related to smoking impact on wound healing Goals: Ulcer/skin breakdown will have a volume reduction of 30% by week 4 Date Initiated: 09/21/2016 Target Resolution Date: 08/17/2017 Goal Status: Active Ulcer/skin breakdown will have a volume reduction of 50% by week 8 Date Initiated: 09/21/2016 Target Resolution Date: 08/17/2017 Goal Status: Active Ulcer/skin breakdown will have a volume reduction of 80% by week 12 Date Initiated: 09/21/2016 Target Resolution Date: 09/14/2017 Goal Status: Active Ulcer/skin breakdown will heal within 14 weeks Date Initiated: 09/21/2016 Target Resolution Date: 08/17/2017 Goal Status: Active Interventions: Assess patient/caregiver ability to obtain necessary supplies Assess patient/caregiver ability to perform ulcer/skin care regimen upon admission and as needed Assess ulceration(s) every visit Notes: Electronic Signature(s) Signed: 08/29/2017 4:33:19 PM By: Roger Shelter Entered By: Roger Shelter on 08/29/2017 09:20:41 Cartelli, [name] ([id number]) -------------------------------------------------------------------------------- Pain Assessment Details Patient Name: Fyfe, Gerren E. Date of Service: 08/29/2017 8:45 AM Medical Record Number: [id number] Patient Account Number: [id number] Date of Birth/Sex: 12-19-1950 (67 y.o. M) Treating RN: Montey Hora Primary Care Elgin Carn: Lamonte Sakai Other Clinician: Referring Marcellis Frampton: Lamonte Sakai Treating Dontre Laduca/Extender: Cathie Olden in Treatment: 48 Active Problems Location of  Pain Severity and Description of Pain Patient Has Paino No Site Locations Pain Management and Medication Current Pain Management: Electronic Signature(s) Signed: 08/29/2017 5:31:29 PM By: Montey Hora Entered By: Montey Hora on 08/29/2017 08:55:53 Northup, [name] ([id number]) -------------------------------------------------------------------------------- Patient/Caregiver Education Details Patient Name: Villella, Kurt E. Date of Service: 08/29/2017 8:45 AM Medical Record Number: [id number] Patient Account Number: [id number] Date of Birth/Gender: [birth year] (67 y.o. M) Treating RN: Cornell Barman Primary Care Physician: Lamonte Sakai Other Clinician: Referring Physician: Lamonte Sakai Treating Physician/Extender: Cathie Olden in Treatment: 65 Education Assessment Education Provided To: Patient Education Topics Provided Wound/Skin Impairment: Handouts: Caring for Your Ulcer, Other: continue wound care as prescribed Methods: Demonstration, Explain/Verbal Responses: State content correctly Electronic Signature(s) Signed: 08/29/2017 5:02:07 PM By: Gretta Cool, BSN, RN, CWS, Kim RN, BSN Entered By: Gretta Cool, BSN, RN, CWS, Kim on 08/29/2017 09:45:11 Pascuzzi, [name] ([id number]) -------------------------------------------------------------------------------- Wound Assessment Details Patient Name: Raffety, Theresa E. Date of Service: 08/29/2017 8:45 AM Medical Record Number: [id number] Patient Account Number: [id number] Date of Birth/Sex: 1950/09/03 (67 y.o. M) Treating RN: Montey Hora Primary Care Kenzley Ke: Lamonte Sakai Other Clinician: Referring Denali Sharma: Lamonte Sakai Treating Anthony Tamburo/Extender: Cathie Olden in Treatment: 54 Wound Status Wound Number: 1 Primary Diabetic Wound/Ulcer of the Lower Extremity Etiology: Wound Location: Right Toe Great Wound Open Wounding Event: Gradually Appeared Status: Date Acquired: 06/11/2016 Comorbid Anemia, Lymphedema, Congestive Heart Weeks Of  Treatment: 48 History: Failure, Hypertension, Peripheral Venous Clustered Wound: No Disease, Type II Diabetes, Neuropathy Pending Amputation On Presentation Photos Photo Uploaded By: Montey Hora on 08/29/2017 14:12:35 Wound Measurements Length: (cm) 0.3 Width: (cm) 0.5 Depth: (cm) 0.1 Area: (cm) 0.118 Volume: (cm) 0.012 % Reduction in Area: 99.7% % Reduction in Volume: 99.7% Epithelialization: Medium (34-66%) Tunneling: No Undermining: No Wound Description Classification: Grade 2 Wound Margin: Indistinct, nonvisible Exudate Amount: Medium Exudate Type: Serosanguineous Exudate Color: red, brown Foul Odor After Cleansing: No Slough/Fibrino Yes Wound Bed Granulation  Amount: Large (67-100%) Exposed Structure Granulation Quality: Red Fascia Exposed: No Necrotic Amount: Small (1-33%) Fat Layer (Subcutaneous Tissue) Exposed: Yes Necrotic Quality: Adherent Slough Tendon Exposed: No Muscle Exposed: No Joint Exposed: No Bone Exposed: No Periwound Skin Texture Arrazola, Jojuan E. ([id number]) Texture Color No Abnormalities Noted: No No Abnormalities Noted: No Callus: No Atrophie Blanche: No Crepitus: No Cyanosis: No Excoriation: No Ecchymosis: No Induration: No Erythema: No Rash: No Hemosiderin Staining: No Scarring: No Mottled: No Pallor: No Moisture Rubor: No No Abnormalities Noted: No Dry / Scaly: No Temperature / Pain Maceration: No Temperature: No Abnormality Wound Preparation Ulcer Cleansing: Rinsed/Irrigated with Saline Topical Anesthetic Applied: Other: lidocaine 4%, Treatment Notes Wound #1 (Right Toe Great) 1. Cleansed with: Clean wound with Normal Saline 2. Anesthetic Topical Lidocaine 4% cream to wound bed prior to debridement 4. Dressing Applied: Medihoney Gel 5. Secondary Dressing Applied ABD and Kerlix/Conform 6. Footwear/Offloading device applied Surgical shoe 7. Secured with Other (specify in notes) Notes [facility] Electronic  Signature(s) Signed: 08/29/2017 5:31:29 PM By: Montey Hora Entered By: Montey Hora on 08/29/2017 09:00:37 Yellen, [name] ([id number]) -------------------------------------------------------------------------------- Wound Assessment Details Patient Name: Kington, Nehemias E. Date of Service: 08/29/2017 8:45 AM Medical Record Number: [id number] Patient Account Number: [id number] Date of Birth/Sex: 02-16-1951 (67 y.o. M) Treating RN: Montey Hora Primary Care Vanessia Bokhari: Lamonte Sakai Other Clinician: Referring Angie Piercey: Lamonte Sakai Treating Arthor Gorter/Extender: Cathie Olden in Treatment: 54 Wound Status Wound Number: 13 Primary Diabetic Wound/Ulcer of the Lower Extremity Etiology: Wound Location: Left Toe Third Wound Open Wounding Event: Gradually Appeared Status: Date Acquired: 08/29/2017 Comorbid Anemia, Lymphedema, Congestive Heart Weeks Of Treatment: 0 History: Failure, Hypertension, Peripheral Venous Clustered Wound: No Disease, Type II Diabetes, Neuropathy Wound Measurements Length: (cm) 0.1 Width: (cm) 0.1 Depth: (cm) 0.1 Area: (cm) 0.008 Volume: (cm) 0.001 % Reduction in Area: 0% % Reduction in Volume: 0% Wound Description Classification: Grade 1 Periwound Skin Texture Texture Color No Abnormalities Noted: No No Abnormalities Noted: No Moisture No Abnormalities Noted: No Electronic Signature(s) Signed: 08/29/2017 4:58:42 PM By: Montey Hora Entered By: Montey Hora on 08/29/2017 16:58:41 Keir, Skylan E. ([id number]) -------------------------------------------------------------------------------- Wound Assessment Details Patient Name: Trinidad, Dagen E. Date of Service: 08/29/2017 8:45 AM Medical Record Number: [id number] Patient Account Number: [id number] Date of Birth/Sex: 1950/09/24 (67 y.o. M) Treating RN: Montey Hora Primary Care Any Mcneice: Lamonte Sakai Other Clinician: Referring Shanena Pellegrino: Lamonte Sakai Treating Skii Cleland/Extender: Cathie Olden in Treatment: 80 Wound Status Wound Number: 2 Primary Diabetic Wound/Ulcer of the Lower Extremity Etiology: Wound Location: Right Toe Second Wound Open Wounding Event: Gradually Appeared Status: Date Acquired: 06/11/2016 Comorbid Anemia, Lymphedema, Congestive Heart Weeks Of Treatment: 48 History: Failure, Hypertension, Peripheral Venous Clustered Wound: No Disease, Type II Diabetes, Neuropathy Pending Amputation On Presentation Photos Photo Uploaded By: Montey Hora on 08/29/2017 14:12:36 Wound Measurements Length: (cm) 0.7 Width: (cm) 0.3 Depth: (cm) 0.1 Area: (cm) 0.165 Volume: (cm) 0.016 % Reduction in Area: 94.6% % Reduction in Volume: 94.8% Epithelialization: Large (67-100%) Tunneling: No Undermining: No Wound Description Classification: Grade 2 Wound Margin: Flat and Intact Exudate Amount: Small Exudate Type: Serous Exudate Color: amber Foul Odor After Cleansing: No Slough/Fibrino No Wound Bed Granulation Amount: Large (67-100%) Exposed Structure Granulation Quality: Red Fascia Exposed: No Necrotic Amount: None Present (0%) Fat Layer (Subcutaneous Tissue) Exposed: No Tendon Exposed: No Muscle Exposed: No Joint Exposed: No Bone Exposed: No Periwound Skin Texture Grieshop, Shahzaib E. ([id number]) Texture Color No Abnormalities Noted: No No Abnormalities Noted: No Callus: No Atrophie Blanche: No Crepitus: No  Cyanosis: No Excoriation: No Ecchymosis: No Induration: No Erythema: No Rash: No Hemosiderin Staining: No Scarring: No Mottled: No Pallor: No Moisture Rubor: No No Abnormalities Noted: No Dry / Scaly: No Temperature / Pain Maceration: No Temperature: No Abnormality Wound Preparation Ulcer Cleansing: Rinsed/Irrigated with Saline Topical Anesthetic Applied: Other: lidocaine 4%, Treatment Notes Wound #2 (Right Toe Second) 1. Cleansed with: Clean wound with Normal Saline 2. Anesthetic Topical Lidocaine 4% cream to wound bed  prior to debridement 4. Dressing Applied: Medihoney Gel 5. Secondary Dressing Applied ABD and Kerlix/Conform 6. Footwear/Offloading device applied Surgical shoe 7. Secured with Other (specify in notes) Notes [facility] Electronic Signature(s) Signed: 08/29/2017 5:31:29 PM By: Montey Hora Entered By: Montey Hora on 08/29/2017 09:02:19 Tankard, [name] ([id number]) -------------------------------------------------------------------------------- Wound Assessment Details Patient Name: Canizalez, Macyn E. Date of Service: 08/29/2017 8:45 AM Medical Record Number: [id number] Patient Account Number: [id number] Date of Birth/Sex: 1950-10-22 (67 y.o. M) Treating RN: Montey Hora Primary Care Jaedin Trumbo: Lamonte Sakai Other Clinician: Referring Clance Baquero: Lamonte Sakai Treating Jonni Oelkers/Extender: Cathie Olden in Treatment: 45 Wound Status Wound Number: 5 Primary Diabetic Wound/Ulcer of the Lower Extremity Etiology: Wound Location: Left Toe Great Wound Open Wounding Event: Gradually Appeared Status: Date Acquired: 06/11/2016 Comorbid Anemia, Lymphedema, Congestive Heart Weeks Of Treatment: 48 History: Failure, Hypertension, Peripheral Venous Clustered Wound: No Disease, Type II Diabetes, Neuropathy Pending Amputation On Presentation Photos Photo Uploaded By: Montey Hora on 08/29/2017 14:13:13 Wound Measurements Length: (cm) 0.1 Width: (cm) 0.1 Depth: (cm) 0.1 Area: (cm) 0.008 Volume: (cm) 0.001 % Reduction in Area: 99.9% % Reduction in Volume: 99.9% Epithelialization: None Tunneling: No Undermining: No Wound Description Classification: Grade 2 Wound Margin: Flat and Intact Exudate Amount: Medium Exudate Type: Serous Exudate Color: amber Foul Odor After Cleansing: No Slough/Fibrino Yes Wound Bed Granulation Amount: Large (67-100%) Exposed Structure Granulation Quality: Red Fascia Exposed: No Necrotic Amount: None Present (0%) Fat Layer (Subcutaneous Tissue)  Exposed: No Tendon Exposed: No Muscle Exposed: No Joint Exposed: No Bone Exposed: No Periwound Skin Texture Mcquiston, Shahiem E. ([id number]) Texture Color No Abnormalities Noted: No No Abnormalities Noted: No Callus: No Atrophie Blanche: No Crepitus: No Cyanosis: No Excoriation: No Ecchymosis: No Induration: No Erythema: No Rash: No Hemosiderin Staining: No Scarring: No Mottled: No Pallor: No Moisture Rubor: No No Abnormalities Noted: No Dry / Scaly: No Temperature / Pain Maceration: No Temperature: No Abnormality Wound Preparation Ulcer Cleansing: Rinsed/Irrigated with Saline Topical Anesthetic Applied: Other: lidocaine 4%, Treatment Notes Wound #5 (Left Toe Great) 1. Cleansed with: Clean wound with Normal Saline 2. Anesthetic Topical Lidocaine 4% cream to wound bed prior to debridement 4. Dressing Applied: Medihoney Gel 5. Secondary Dressing Applied ABD and Kerlix/Conform 6. Footwear/Offloading device applied Surgical shoe 7. Secured with Other (specify in notes) Notes [employer] Electronic Signature(s) Signed: 08/29/2017 5:31:29 PM By: Montey Hora Entered By: Montey Hora on 08/29/2017 09:03:04 Setters, [name] ([id number]) -------------------------------------------------------------------------------- Wound Assessment Details Patient Name: Burkhammer, Jamorian E. Date of Service: 08/29/2017 8:45 AM Medical Record Number: [id number] Patient Account Number: [id number] Date of Birth/Sex: [birth year] (67 y.o. M) Treating RN: Montey Hora Primary Care Arben Packman: Lamonte Sakai Other Clinician: Referring Glynna Failla: Lamonte Sakai Treating Delmore Sear/Extender: Cathie Olden in Treatment: 48 Wound Status Wound Number: 6 Primary Diabetic Wound/Ulcer of the Lower Extremity Etiology: Wound Location: Left Toe Second Wound Open Wounding Event: Gradually Appeared Status: Date Acquired: 06/11/2016 Comorbid Anemia, Lymphedema, Congestive Heart Weeks Of Treatment: 48 History:  Failure, Hypertension, Peripheral Venous Clustered Wound: No Disease, Type II Diabetes, Neuropathy Pending Amputation On Presentation  Photos Photo Uploaded By: Montey Hora on 08/29/2017 14:13:14 Wound Measurements Length: (cm) 0.8 Width: (cm) 0.2 Depth: (cm) 0.1 Area: (cm) 0.126 Volume: (cm) 0.013 % Reduction in Area: 94.1% % Reduction in Volume: 93.9% Epithelialization: Large (67-100%) Tunneling: No Undermining: No Wound Description Classification: Grade 2 Wound Margin: Flat and Intact Exudate Amount: Medium Exudate Type: Serous Exudate Color: amber Foul Odor After Cleansing: No Slough/Fibrino No Wound Bed Granulation Amount: Large (67-100%) Exposed Structure Granulation Quality: Red Fascia Exposed: No Necrotic Amount: None Present (0%) Fat Layer (Subcutaneous Tissue) Exposed: No Tendon Exposed: No Muscle Exposed: No Joint Exposed: No Bone Exposed: No Periwound Skin Texture Laird, Wyn E. ([id number]) Texture Color No Abnormalities Noted: No No Abnormalities Noted: No Callus: No Atrophie Blanche: No Crepitus: No Cyanosis: No Excoriation: No Ecchymosis: No Induration: No Erythema: No Rash: No Hemosiderin Staining: No Scarring: No Mottled: No Pallor: No Moisture Rubor: No No Abnormalities Noted: No Dry / Scaly: No Temperature / Pain Maceration: No Temperature: No Abnormality Wound Preparation Ulcer Cleansing: Rinsed/Irrigated with Saline Topical Anesthetic Applied: Other: lidocaine 4%, Treatment Notes Wound #6 (Left Toe Second) 1. Cleansed with: Clean wound with Normal Saline 2. Anesthetic Topical Lidocaine 4% cream to wound bed prior to debridement 4. Dressing Applied: Medihoney Gel 5. Secondary Dressing Applied ABD and Kerlix/Conform 6. Footwear/Offloading device applied Surgical shoe 7. Secured with Other (specify in notes) Notes [employer] Electronic Signature(s) Signed: 08/29/2017 5:31:29 PM By: Montey Hora Entered By: Montey Hora on 08/29/2017 09:03:55 Yinger, [name] ([id number]) -------------------------------------------------------------------------------- Vitals Details Patient Name: Frederic, Ashby E. Date of Service: 08/29/2017 8:45 AM Medical Record Number: [id number] Patient Account Number: [id number] Date of Birth/Sex: 07-07-1950 (67 y.o. M) Treating RN: Montey Hora Primary Care Zerek Litsey: Lamonte Sakai Other Clinician: Referring Norine Reddington: Lamonte Sakai Treating Keo Schirmer/Extender: Cathie Olden in Treatment: 48 Vital Signs Time Taken: 09:05 Temperature (F): 98.0 Height (in): 69 Pulse (bpm): 64 Weight (lbs): 168 Respiratory Rate (breaths/min): 16 Body Mass Index (BMI): 24.8 Blood Pressure (mmHg): 176/86 Reference Range: 80 - 120 mg / dl Electronic Signature(s) Signed: 08/29/2017 5:31:29 PM By: Montey Hora Entered By: Montey Hora on 08/29/2017 09:06:15

## 2017-09-04 NOTE — Progress Notes (Signed)
[name] ([id number]) Visit Report for 08/15/2017 Arrival Information Details Patient Name: [name], [name] Date of Service: 08/15/2017 10:45 AM Medical Record Number: [id number] Patient Account Number: [id number] Date of Birth/Sex: 05-25-1950 (67 y.o. M) Treating RN: [name] Primary Care [name]: [name] Other Clinician: Referring [name]: [name] Treating Tran [name]: [name] Weeks in Treatment: 16 Visit Information History Since Last Visit Added or deleted any medications: No Patient Arrived: Ambulatory Any new allergies or adverse reactions: No Arrival Time: 11:05 Had a fall or experienced change in No Accompanied By: brother-in- activities of daily living that may affect law risk of falls: Transfer Assistance: None Signs or symptoms of abuse/neglect since last visito No Patient Identification Verified: Yes Hospitalized since last visit: No Secondary Verification Process Completed: Yes Implantable device outside of the clinic excluding No Patient Requires Transmission-Based No cellular tissue based products placed in the center Precautions: since last visit: Patient Has Alerts: Yes Pain Present Now: No Patient Alerts: DMII Electronic Signature(s) Signed: 09/04/2017 7:45:54 AM By: [name] [name] on 08/15/2017 11:06:24 [name] ([id number]) -------------------------------------------------------------------------------- Clinic Level of Care Assessment Details Patient Name: [name], [name] Date of Service: 08/15/2017 10:45 AM Medical Record Number: [id number] Patient Account Number: [id number] Date of Birth/Sex: 1950/12/18 (67 y.o. M) Treating RN: [name] Primary Care Madalyn Legner: [name] Other Clinician: Referring [name]: [name] Treating [name]: [name] Weeks in Treatment: 57 Clinic Level of Care Assessment Items TOOL 4 Quantity Score X - Use when only an EandM is  performed on FOLLOW-UP visit 1 0 ASSESSMENTS - Nursing Assessment / Reassessment X - Reassessment of Co-morbidities (includes updates in patient status) 1 10 X- 1 5 Reassessment of Adherence to Treatment Plan ASSESSMENTS - Wound and Skin Assessment / Reassessment []  - Simple Wound Assessment / Reassessment - one wound 0 X- 4 5 Complex Wound Assessment / Reassessment - multiple wounds []  - 0 Dermatologic / Skin Assessment (not related to wound area) ASSESSMENTS - Focused Assessment []  - Circumferential Edema Measurements - multi extremities 0 []  - 0 Nutritional Assessment / Counseling / Intervention []  - 0 Lower Extremity Assessment (monofilament, tuning fork, pulses) []  - 0 Peripheral Arterial Disease Assessment (using hand held doppler) ASSESSMENTS - Ostomy and/or Continence Assessment and Care []  - Incontinence Assessment and Management 0 []  - 0 Ostomy Care Assessment and Management (repouching, etc.) PROCESS - Coordination of Care X - Simple Patient / Family Education for ongoing care 1 15 []  - 0 Complex (extensive) Patient / Family Education for ongoing care []  - 0 Staff obtains Programmer, systems, Records, Test Results / Process Orders []  - 0 Staff telephones HHA, Nursing Homes / Clarify orders / etc []  - 0 Routine Transfer to another Facility (non-emergent condition) []  - 0 Routine Hospital Admission (non-emergent condition) []  - 0 New Admissions / Biomedical engineer / Ordering NPWT, Apligraf, etc. []  - 0 Emergency Hospital Admission (emergent condition) X- 1 10 Simple Discharge Coordination [name], [name] ([id number]) []  - 0 Complex (extensive) Discharge Coordination PROCESS - Special Needs []  - Pediatric / Minor Patient Management 0 []  - 0 Isolation Patient Management []  - 0 Hearing / Language / Visual special needs []  - 0 Assessment of Community assistance (transportation, D/C planning, etc.) []  - 0 Additional assistance / Altered mentation []  - 0 Support  Surface(s) Assessment (bed, cushion, seat, etc.) INTERVENTIONS - Wound Cleansing / Measurement []  - Simple Wound Cleansing - one wound 0 X- 4 5 Complex Wound Cleansing - multiple wounds  X- 1 5 Wound Imaging (photographs - any number of wounds) []  - 0 Wound Tracing (instead of photographs) []  - 0 Simple Wound Measurement - one wound X- 4 5 Complex Wound Measurement - multiple wounds INTERVENTIONS - Wound Dressings []  - Small Wound Dressing one or multiple wounds 0 X- 2 15 Medium Wound Dressing one or multiple wounds []  - 0 Large Wound Dressing one or multiple wounds []  - 0 Application of Medications - topical []  - 0 Application of Medications - injection INTERVENTIONS - Miscellaneous []  - External ear exam 0 []  - 0 Specimen Collection (cultures, biopsies, blood, body fluids, etc.) []  - 0 Specimen(s) / Culture(s) sent or taken to Lab for analysis []  - 0 Patient Transfer (multiple staff / Civil Service fast streamer / Similar devices) []  - 0 Simple Staple / Suture removal (25 or less) []  - 0 Complex Staple / Suture removal (26 or more) []  - 0 Hypo / Hyperglycemic Management (close monitor of Blood Glucose) []  - 0 Ankle / Brachial Index (ABI) - do not check if billed separately X- 1 5 Vital Signs [name], [name] ([id number]) Has the patient been seen at the hospital within the last three years: Yes Total Score: 140 Level Of Care: New/Established - Level 4 Electronic Signature(s) Signed: 08/16/2017 4:00:06 PM By: [name] [name] By: [name] on 08/15/2017 13:17:08 [name], [name] ([id number]) -------------------------------------------------------------------------------- Encounter Discharge Information Details Patient Name: [name], [name] Date of Service: 08/15/2017 10:45 AM Medical Record Number: [id number] Patient Account Number: [id number] Date of Birth/Sex: Feb 16, 1951 (67 y.o. M) Treating RN: [name] Primary Care Kiela Shisler: [name] Other  Clinician: Referring [name]: [name] Treating Yassine Brunsman/Extender: [name] Weeks in Treatment: 83 Encounter Discharge Information Items Discharge Condition: Stable Ambulatory Status: Ambulatory Discharge Destination: Home Transportation: Private Auto Accompanied By: brother-in-law Schedule Follow-up Appointment: Yes Clinical Summary of Care: Electronic Signature(s) Signed: 09/04/2017 7:45:54 AM By: [name] [name] on 08/15/2017 12:16:04 Cooner, [name] ([id number]) -------------------------------------------------------------------------------- Lower Extremity Assessment Details Patient Name: [name], [name] Date of Service: 08/15/2017 10:45 AM Medical Record Number: [id number] Patient Account Number: [id number] Date of Birth/Sex: [birth year] (67 y.o. M) Treating RN: [name] Primary Care Andrick Rust: [name] Other Clinician: Referring Terryn Rosenkranz: [name] Treating Navdeep Halt/Extender: [name] Weeks in Treatment: 46 Edema Assessment Assessed: [Left: No] [Right: No] Edema: [Left: Yes] [Right: Yes] Calf Left: Right: Point of Measurement: cm From Medial Instep 35 cm 36 cm Ankle Left: Right: Point of Measurement: cm From Medial Instep 24 cm 23.5 cm Vascular Assessment Claudication: Claudication Assessment [Left:None] [Right:None] Pulses: Dorsalis Pedis Palpable: [Left:Yes] [Right:Yes] Posterior Tibial Palpable: [Left:Yes] Extremity colors, hair growth, and conditions: Hair Growth on Extremity: [Left:Yes] [Right:Yes] Temperature of Extremity: [Left:Warm] [Right:Warm] Capillary Refill: [Left:< 3 seconds] [Right:< 3 seconds] Dependent Rubor: [Left:No] [Right:No] Blanched when Elevated: [Left:No] [Right:No] Lipodermatosclerosis: [Left:No] [Right:No] Electronic Signature(s) Signed: 08/16/2017 4:00:06 PM By: [name] Signed: 09/04/2017 7:45:54 AM By: [name] [name] on 08/15/2017  11:33:02 Brodbeck, Cristobal E. ([id number]) -------------------------------------------------------------------------------- Multi Wound Chart Details Patient Name: [name], [name] Date of Service: 08/15/2017 10:45 AM Medical Record Number: [id number] Patient Account Number: [id number] Date of Birth/Sex: [birth year] (67 y.o. M) Treating RN: [name] Primary Care Braxdon Gappa: Lamonte [name] Other Clinician: Referring Mirko Tailor: [name] Treating Breasia Karges/Extender: [name] Weeks in Treatment: 46 Vital Signs Height(in): 69 Pulse(bpm): 66 Weight(lbs): 168 Blood Pressure(mmHg): 149/76 Body Mass Index(BMI): 25 Temperature(F): 98.3 Respiratory Rate 16 (breaths/min): Photos: [1:No Photos] [2:No Photos] [5:No Photos] Wound Location: [  1:Right Toe Great] [2:Right Toe Second] [5:Left Toe Great] Wounding Event: [1:Gradually Appeared] [2:Gradually Appeared] [5:Gradually Appeared] Primary Etiology: [1:Diabetic Wound/Ulcer of the Lower Extremity] [2:Diabetic Wound/Ulcer of the Lower Extremity] [5:Diabetic Wound/Ulcer of the Lower Extremity] Comorbid History: [1:Anemia, Lymphedema, Congestive Heart Failure, Hypertension, Peripheral Venous Disease, Type II Diabetes, Neuropathy] [2:Anemia, Lymphedema, Congestive Heart Failure, Hypertension, Peripheral Venous Disease, Type II Diabetes,  Neuropathy] [5:Anemia, Lymphedema, Congestive Heart Failure, Hypertension, Peripheral Venous Disease, Type II Diabetes, Neuropathy] Date Acquired: [1:06/11/2016] [2:06/11/2016] [5:06/11/2016] Weeks of Treatment: [1:46] [2:46] [5:46] Wound Status: [1:Open] [2:Open] [5:Open] Pending Amputation on [1:Yes] [2:Yes] [5:Yes] Presentation: Measurements L x W x D [1:1x2x0.3] [2:0.5x1.9x0.1] [5:0.2x1x0.1] (cm) Area (cm) : [1:1.571] [2:0.746] [5:0.157] Volume (cm) : [1:0.471] [2:0.075] [5:0.016] % Reduction in Area: [1:96.70%] [2:75.70%] [5:98.90%] % Reduction in Volume: [1:90.00%] [2:75.60%] [5:98.90%] Classification:  [1:Grade 2] [2:Grade 2] [5:Grade 2] Exudate Amount: [1:Medium] [2:Small] [5:None Present] Exudate Type: [1:Serosanguineous] [2:Serosanguineous] [5:N/A] Exudate Color: [1:red, brown] [2:red, brown] [5:N/A] Wound Margin: [1:Indistinct, nonvisible] [2:Distinct, outline attached] [5:Flat and Intact] Granulation Amount: [1:Small (1-33%)] [2:Medium (34-66%)] [5:Large (67-100%)] Granulation Quality: [1:Red] [2:Red] [5:Pink] Necrotic Amount: [1:Medium (34-66%)] [2:Small (1-33%)] [5:N/A] Exposed Structures: [1:Fascia: No Fat Layer (Subcutaneous Tissue) Exposed: No Tendon: No Muscle: No Joint: No Bone: No] [2:Fat Layer (Subcutaneous Tissue) Exposed: Yes Fascia: No Tendon: No Muscle: No Joint: No Bone: No] [5:Fascia: No Fat Layer (Subcutaneous Tissue)  Exposed: No Tendon: No Muscle: No Joint: No Bone: No] Epithelialization: [1:N/A] [2:N/A] [5:Large (67-100%)] Periwound Skin Texture: Excoriation: No Excoriation: No Excoriation: No Induration: No Induration: No Induration: No Callus: No Callus: No Callus: No Crepitus: No Crepitus: No Crepitus: No Rash: No Rash: No Rash: No Scarring: No Scarring: No Scarring: No Periwound Skin Moisture: Maceration: No Maceration: No Maceration: No Dry/Scaly: No Dry/Scaly: No Dry/Scaly: No Periwound Skin Color: Atrophie Blanche: No Atrophie Blanche: No Atrophie Blanche: No Cyanosis: No Cyanosis: No Cyanosis: No Ecchymosis: No Ecchymosis: No Ecchymosis: No Erythema: No Erythema: No Erythema: No Hemosiderin Staining: No Hemosiderin Staining: No Hemosiderin Staining: No Mottled: No Mottled: No Mottled: No Pallor: No Pallor: No Pallor: No Rubor: No Rubor: No Rubor: No Temperature: N/A No Abnormality N/A Tenderness on Palpation: No No No Wound Preparation: Ulcer Cleansing: Ulcer Cleansing: Ulcer Cleansing: Rinsed/Irrigated with Saline, Rinsed/Irrigated with Saline, Rinsed/Irrigated with Saline, Wound Cleanser Wound Cleanser Wound  Cleanser Topical Anesthetic Applied: Topical Anesthetic Applied: Topical Anesthetic Applied: Other: lidocaine 4% Other: lidocaine 4% Other: lidocaine 4% Wound Number: 6 N/A N/A Photos: No Photos N/A N/A Wound Location: Left Toe Second N/A N/A Wounding Event: Gradually Appeared N/A N/A Primary Etiology: Diabetic Wound/Ulcer of the N/A N/A Lower Extremity Comorbid History: Anemia, Lymphedema, N/A N/A Congestive Heart Failure, Hypertension, Peripheral Venous Disease, Type II Diabetes, Neuropathy Date Acquired: 06/11/2016 N/A N/A Weeks of Treatment: 46 N/A N/A Wound Status: Open N/A N/A Pending Amputation on Yes N/A N/A Presentation: Measurements L x W x D 0.2x0.2x0.1 N/A N/A (cm) Area (cm) : 0.031 N/A N/A Volume (cm) : 0.003 N/A N/A % Reduction in Area: 98.50% N/A N/A % Reduction in Volume: 98.60% N/A N/A Classification: Grade 2 N/A N/A Exudate Amount: None Present N/A N/A Exudate Type: N/A N/A N/A Exudate Color: N/A N/A N/A Wound Margin: Distinct, outline attached N/A N/A Granulation Amount: Large (67-100%) N/A N/A Granulation Quality: Pale N/A N/A Necrotic Amount: None Present (0%) N/A N/A Exposed Structures: Fascia: No N/A N/A Fat Layer (Subcutaneous Tissue) Exposed: No Flynn, Bernell E. ([id number]) Tendon: No Muscle: No Joint: No Bone: No Epithelialization: Large (67-100%) N/A N/A Periwound Skin  Texture: No Abnormalities Noted N/A N/A Periwound Skin Moisture: No Abnormalities Noted N/A N/A Periwound Skin Color: No Abnormalities Noted N/A N/A Temperature: No Abnormality N/A N/A Tenderness on Palpation: No N/A N/A Wound Preparation: Ulcer Cleansing: N/A N/A Rinsed/Irrigated with Saline, Wound Cleanser Topical Anesthetic Applied: Other: lidocaine 4% Treatment Notes Electronic Signature(s) Signed: 08/16/2017 4:00:06 PM By: [name] [name] By: [name] on 08/15/2017 11:57:03 Rutan, [name]  ([id number]) -------------------------------------------------------------------------------- [place] Details Patient Name: [name], [name] Date of Service: 08/15/2017 10:45 AM Medical Record Number: [id number] Patient Account Number: [id number] Date of Birth/Sex: [birth year] (67 y.o. M) Treating RN: [name] Primary Care Braylee Lal: [name] Other Clinician: Referring Adelee Hannula: [name] Treating Faustine Tates/Extender: [name] Weeks in Treatment: 44 Active Inactive ` Abuse / Safety / Falls / Self Care Management Nursing Diagnoses: Potential for falls Goals: Patient will remain injury free related to falls Date Initiated: 09/21/2016 Target Resolution Date: 09/14/2017 Goal Status: Active Interventions: Assess fall risk on admission and as needed Notes: ` Nutrition Nursing Diagnoses: Potential for alteratiion in Nutrition/Potential for imbalanced nutrition Goals: Patient/caregiver agrees to and verbalizes understanding of need to use nutritional supplements and/or vitamins as prescribed Date Initiated: 09/21/2016 Target Resolution Date: 09/14/2017 Goal Status: Active Interventions: Assess patient nutrition upon admission and as needed per policy Notes: ` Orientation to the Wound Care Program Nursing Diagnoses: Knowledge deficit related to the wound healing center program Goals: Patient/caregiver will verbalize understanding of the [place] Program Date Initiated: 09/21/2016 Target Resolution Date: 06/15/2017 Goal Status: Active Interventions: DARKZubin, Pontillo ([id number]) Provide education on orientation to the wound center Notes: ` Wound/Skin Impairment Nursing Diagnoses: Knowledge deficit related to smoking impact on wound healing Goals: Ulcer/skin breakdown will have a volume reduction of 30% by week 4 Date Initiated: 09/21/2016 Target Resolution Date: 08/17/2017 Goal Status: Active Ulcer/skin breakdown will have a volume  reduction of 50% by week 8 Date Initiated: 09/21/2016 Target Resolution Date: 08/17/2017 Goal Status: Active Ulcer/skin breakdown will have a volume reduction of 80% by week 12 Date Initiated: 09/21/2016 Target Resolution Date: 09/14/2017 Goal Status: Active Ulcer/skin breakdown will heal within 14 weeks Date Initiated: 09/21/2016 Target Resolution Date: 08/17/2017 Goal Status: Active Interventions: Assess patient/caregiver ability to obtain necessary supplies Assess patient/caregiver ability to perform ulcer/skin care regimen upon admission and as needed Assess ulceration(s) every visit Notes: Electronic Signature(s) Signed: 08/16/2017 4:00:06 PM By: [name] [name] By: [name] on 08/15/2017 11:56:50 Atwood, [name] ([id number]) -------------------------------------------------------------------------------- Pain Assessment Details Patient Name: [name], [name] Date of Service: 08/15/2017 10:45 AM Medical Record Number: [id number] Patient Account Number: [id number] Date of Birth/Sex: [birth year] (67 y.o. M) Treating RN: [name] Primary Care Jaquin Coy: [name] Other Clinician: Referring Seanpatrick Maisano: [name] Treating Debralee Braaksma/Extender: [name] Weeks in Treatment: 49 Active Problems Location of Pain Severity and Description of Pain Patient Has Paino No Site Locations Pain Management and Medication Current Pain Management: Electronic Signature(s) Signed: 08/16/2017 4:00:06 PM By: [name] Signed: 09/04/2017 7:45:54 AM By: [name] [name] on 08/15/2017 11:06:33 Ferber, [name] ([id number]) -------------------------------------------------------------------------------- Patient/Caregiver Education Details Patient Name: [name], [name] Date of Service: 08/15/2017 10:45 AM Medical Record Number: [id number] Patient Account Number: [id number] Date of Birth/Gender: [birth year] (67 y.o. M) Treating RN: [name] Primary Care Physician: [name] Other Clinician: Referring Physician: Lamonte [name] Treating Physician/Extender: Sharalyn Ink in Treatment: 66 Education Assessment Education Provided To: Patient Education Topics Provided Wound/Skin Impairment: Handouts: Caring for Your Ulcer Methods: Explain/Verbal  Responses: State content correctly Electronic Signature(s) Signed: 09/04/2017 7:45:54 AM By: [name] [name] on 08/15/2017 12:16:23 Rocchio, Lan [name] Kitchen ([id number]) -------------------------------------------------------------------------------- Wound Assessment Details Patient Name: [name], [name] Date of Service: 08/15/2017 10:45 AM Medical Record Number: [id number] Patient Account Number: [id number] Date of Birth/Sex: [birth year] (67 y.o. M) Treating RN: [name] Primary Care Kenroy Timberman: [name] Other Clinician: Referring Krisinda Giovanni: [name] Treating Malaiyah Achorn/Extender: [name] Weeks in Treatment: 53 Wound Status Wound Number: 1 Primary Diabetic Wound/Ulcer of the Lower Extremity Etiology: Wound Location: Right Toe Great Wound Open Wounding Event: Gradually Appeared Status: Date Acquired: 06/11/2016 Comorbid Anemia, Lymphedema, Congestive Heart Weeks Of Treatment: 46 History: Failure, Hypertension, Peripheral Venous Clustered Wound: No Disease, Type II Diabetes, Neuropathy Pending Amputation On Presentation Photos Photo Uploaded By: Gretta Cool, BSN, RN, CWS, Kim on 08/15/2017 14:57:35 Wound Measurements Length: (cm) 1 Width: (cm) 2 Depth: (cm) 0.3 Area: (cm) 1.571 Volume: (cm) 0.471 % Reduction in Area: 96.7% % Reduction in Volume: 90% Wound Description Classification: Grade 2 Foul Odor Wound Margin: Indistinct, nonvisible Slough/Fi Exudate Amount: Medium Exudate Type: Serosanguineous Exudate Color: red, brown After Cleansing: No brino Yes Wound Bed Granulation Amount: Small (1-33%) Exposed  Structure Granulation Quality: Red Fascia Exposed: No Necrotic Amount: Medium (34-66%) Fat Layer (Subcutaneous Tissue) Exposed: No Necrotic Quality: Adherent Slough Tendon Exposed: No Muscle Exposed: No Joint Exposed: No Bone Exposed: No Periwound Skin Texture Faith, Sanchez E. ([id number]) Texture Color No Abnormalities Noted: No No Abnormalities Noted: No Callus: No Atrophie Blanche: No Crepitus: No Cyanosis: No Excoriation: No Ecchymosis: No Induration: No Erythema: No Rash: No Hemosiderin Staining: No Scarring: No Mottled: No Pallor: No Moisture Rubor: No No Abnormalities Noted: No Dry / Scaly: No Maceration: No Wound Preparation Ulcer Cleansing: Rinsed/Irrigated with Saline, Wound Cleanser Topical Anesthetic Applied: Other: lidocaine 4%, Electronic Signature(s) Signed: 08/16/2017 4:00:06 PM By: [name] Signed: 09/04/2017 7:45:54 AM By: [name] [name] on 08/15/2017 11:26:57 Pontius, Chasyn [name] Kitchen ([id number]) -------------------------------------------------------------------------------- Wound Assessment Details Patient Name: [name], [name] Date of Service: 08/15/2017 10:45 AM Medical Record Number: [id number] Patient Account Number: [id number] Date of Birth/Sex: 11-08-1950 (67 y.o. M) Treating RN: [name] Primary Care Giorgio Chabot: [name] Other Clinician: Referring Amarra Sawyer: [name] Treating Tanith Dagostino/Extender: [name] Weeks in Treatment: 43 Wound Status Wound Number: 2 Primary Diabetic Wound/Ulcer of the Lower Extremity Etiology: Wound Location: Right Toe Second Wound Open Wounding Event: Gradually Appeared Status: Date Acquired: 06/11/2016 Comorbid Anemia, Lymphedema, Congestive Heart Weeks Of Treatment: 46 History: Failure, Hypertension, Peripheral Venous Clustered Wound: No Disease, Type II Diabetes, Neuropathy Pending Amputation On Presentation Photos Photo Uploaded By: Gretta Cool, BSN, RN, CWS, Kim on  08/15/2017 14:57:53 Wound Measurements Length: (cm) 0.5 Width: (cm) 1.9 Depth: (cm) 0.1 Area: (cm) 0.746 Volume: (cm) 0.075 % Reduction in Area: 75.7% % Reduction in Volume: 75.6% Wound Description Classification: Grade 2 Foul Odor Wound Margin: Distinct, outline attached Slough/Fi Exudate Amount: Small Exudate Type: Serosanguineous Exudate Color: red, brown After Cleansing: No brino Yes Wound Bed Granulation Amount: Medium (34-66%) Exposed Structure Granulation Quality: Red Fascia Exposed: No Necrotic Amount: Small (1-33%) Fat Layer (Subcutaneous Tissue) Exposed: Yes Necrotic Quality: Adherent Slough Tendon Exposed: No Muscle Exposed: No Joint Exposed: No Bone Exposed: No Periwound Skin Texture Allbaugh, Jeremias E. ([id number]) Texture Color No Abnormalities Noted: No No Abnormalities Noted: No Callus: No Atrophie Blanche: No Crepitus: No Cyanosis: No Excoriation: No Ecchymosis: No Induration: No Erythema: No Rash: No Hemosiderin Staining: No Scarring: No Mottled: No Pallor: No  Moisture Rubor: No No Abnormalities Noted: No Dry / Scaly: No Temperature / Pain Maceration: No Temperature: No Abnormality Wound Preparation Ulcer Cleansing: Rinsed/Irrigated with Saline, Wound Cleanser Topical Anesthetic Applied: Other: lidocaine 4%, Electronic Signature(s) Signed: 08/16/2017 4:00:06 PM By: [name] Signed: 09/04/2017 7:45:54 AM By: [name] [name] on 08/15/2017 11:28:30 [name], [name] Kitchen ([id number]) -------------------------------------------------------------------------------- Wound Assessment Details Patient Name: [name], [name] Date of Service: 08/15/2017 10:45 AM Medical Record Number: [id number] Patient Account Number: [id number] Date of Birth/Sex: [birth year] (67 y.o. M) Treating RN: [name] Primary Care Montanna Mcbain: [name] Other Clinician: Referring Makinsey Pepitone: [name] Treating Shekina Cordell/Extender: [name] Weeks in Treatment: 33 Wound Status Wound Number: 5 Primary Diabetic Wound/Ulcer of the Lower Extremity Etiology: Wound Location: Left Toe Great Wound Open Wounding Event: Gradually Appeared Status: Date Acquired: 06/11/2016 Comorbid Anemia, Lymphedema, Congestive Heart Weeks Of Treatment: 46 History: Failure, Hypertension, Peripheral Venous Clustered Wound: No Disease, Type II Diabetes, Neuropathy Pending Amputation On Presentation Photos Photo Uploaded By: Gretta Cool, BSN, RN, CWS, Kim on 08/15/2017 14:58:19 Wound Measurements Length: (cm) 0.2 Width: (cm) 1 Depth: (cm) 0.1 Area: (cm) 0.157 Volume: (cm) 0.016 % Reduction in Area: 98.9% % Reduction in Volume: 98.9% Epithelialization: Large (67-100%) Tunneling: No Undermining: No Wound Description Classification: Grade 2 Foul Odor Wound Margin: Flat and Intact Slough/Fi Exudate Amount: None Present After Cleansing: No brino No Wound Bed Granulation Amount: Large (67-100%) Exposed Structure Granulation Quality: Pink Fascia Exposed: No Fat Layer (Subcutaneous Tissue) Exposed: No Tendon Exposed: No Muscle Exposed: No Joint Exposed: No Bone Exposed: No Periwound Skin Texture Texture Color Elem, Jaun E. ([id number]) No Abnormalities Noted: No No Abnormalities Noted: No Callus: No Atrophie Blanche: No Crepitus: No Cyanosis: No Excoriation: No Ecchymosis: No Induration: No Erythema: No Rash: No Hemosiderin Staining: No Scarring: No Mottled: No Pallor: No Moisture Rubor: No No Abnormalities Noted: No Dry / Scaly: No Maceration: No Wound Preparation Ulcer Cleansing: Rinsed/Irrigated with Saline, Wound Cleanser Topical Anesthetic Applied: Other: lidocaine 4%, Electronic Signature(s) Signed: 08/16/2017 4:00:06 PM By: [name] Signed: 09/04/2017 7:45:54 AM By: [name] [name] on 08/15/2017 11:29:34 [name], Usiel [name] Kitchen  ([id number]) -------------------------------------------------------------------------------- Wound Assessment Details Patient Name: Watts, Bonny E. Date of Service: 08/15/2017 10:45 AM Medical Record Number: [id number] Patient Account Number: [id number] Date of Birth/Sex: 1950-08-24 (67 y.o. M) Treating RN: [name] Primary Care Miken Stecher: [name] Other Clinician: Referring Jaysten Essner: [name] Treating Meliton Samad/Extender: [name] Weeks in Treatment: 74 Wound Status Wound Number: 6 Primary Diabetic Wound/Ulcer of the Lower Extremity Etiology: Wound Location: Left Toe Second Wound Open Wounding Event: Gradually Appeared Status: Date Acquired: 06/11/2016 Comorbid Anemia, Lymphedema, Congestive Heart Weeks Of Treatment: 46 History: Failure, Hypertension, Peripheral Venous Clustered Wound: No Disease, Type II Diabetes, Neuropathy Pending Amputation On Presentation Photos Photo Uploaded By: Gretta Cool, BSN, RN, CWS, Kim on 08/15/2017 14:58:40 Wound Measurements Length: (cm) 0.2 Width: (cm) 0.2 Depth: (cm) 0.1 Area: (cm) 0.031 Volume: (cm) 0.003 % Reduction in Area: 98.5% % Reduction in Volume: 98.6% Epithelialization: Large (67-100%) Tunneling: No Undermining: No Wound Description Classification: Grade 2 Foul Odor Wound Margin: Distinct, outline attached Slough/Fi Exudate Amount: None Present After Cleansing: No brino No Wound Bed Granulation Amount: Large (67-100%) Exposed Structure Granulation Quality: Pale Fascia Exposed: No Necrotic Amount: None Present (0%) Fat Layer (Subcutaneous Tissue) Exposed: No Tendon Exposed: No Muscle Exposed: No Joint Exposed: No Bone Exposed: No Periwound Skin Texture Texture Color Emanuele, [name] ([id number]) No Abnormalities Noted: No No Abnormalities Noted:  No Moisture Temperature / Pain No Abnormalities Noted: No Temperature: No Abnormality Wound Preparation Ulcer Cleansing: Rinsed/Irrigated with Saline, Wound  Cleanser Topical Anesthetic Applied: Other: lidocaine 4%, Electronic Signature(s) Signed: 08/16/2017 4:00:06 PM By: [name] Signed: 09/04/2017 7:45:54 AM By: [name] [name] on 08/15/2017 11:30:38 Randleman, [name] ([id number]) -------------------------------------------------------------------------------- Vitals Details Patient Name: Levengood, Samule E. Date of Service: 08/15/2017 10:45 AM Medical Record Number: [id number] Patient Account Number: [id number] Date of Birth/Sex: 03-16-1951 (67 y.o. M) Treating RN: [name] Primary Care Jaylenne Hamelin: [name] Other Clinician: Referring Redina Zeller: [name] Treating Laelia Angelo/Extender: [name] Weeks in Treatment: 37 Vital Signs Time Taken: 11:06 Temperature (F): 98.3 Height (in): 69 Pulse (bpm): 66 Weight (lbs): 168 Respiratory Rate (breaths/min): 16 Body Mass Index (BMI): 24.8 Blood Pressure (mmHg): 149/76 Reference Range: 80 - 120 mg / dl Electronic Signature(s) Signed: 09/04/2017 7:45:54 AM By: [name] [name] on 08/15/2017 11:06:55

## 2017-09-05 ENCOUNTER — Other Ambulatory Visit: Payer: Self-pay | Admitting: Nurse Practitioner

## 2017-09-05 ENCOUNTER — Encounter: Payer: Medicare HMO | Admitting: Nurse Practitioner

## 2017-09-05 ENCOUNTER — Other Ambulatory Visit
Admission: RE | Admit: 2017-09-05 | Discharge: 2017-09-05 | Disposition: A | Discharge status: Home / Self Care | Payer: Medicare HMO | Source: Ambulatory Visit | Attending: Nurse Practitioner | Admitting: Nurse Practitioner

## 2017-09-05 DIAGNOSIS — E11621 Type 2 diabetes mellitus with foot ulcer: Secondary | ICD-10-CM | POA: Diagnosis not present

## 2017-09-05 DIAGNOSIS — I824Y3 Acute embolism and thrombosis of unspecified deep veins of proximal lower extremity, bilateral: Secondary | ICD-10-CM

## 2017-09-05 DIAGNOSIS — B999 Unspecified infectious disease: Secondary | ICD-10-CM | POA: Insufficient documentation

## 2017-09-06 ENCOUNTER — Ambulatory Visit
Admission: RE | Admit: 2017-09-06 | Discharge: 2017-09-06 | Disposition: A | Discharge status: Home / Self Care | Payer: Medicare HMO | Source: Ambulatory Visit | Attending: Nurse Practitioner | Admitting: Nurse Practitioner

## 2017-09-06 DIAGNOSIS — M7989 Other specified soft tissue disorders: Secondary | ICD-10-CM | POA: Diagnosis present

## 2017-09-06 DIAGNOSIS — I82409 Acute embolism and thrombosis of unspecified deep veins of unspecified lower extremity: Secondary | ICD-10-CM | POA: Insufficient documentation

## 2017-09-06 DIAGNOSIS — I824Y3 Acute embolism and thrombosis of unspecified deep veins of proximal lower extremity, bilateral: Secondary | ICD-10-CM

## 2017-09-08 LAB — AEROBIC CULTURE W GRAM STAIN (SUPERFICIAL SPECIMEN)

## 2017-09-08 LAB — AEROBIC CULTURE  (SUPERFICIAL SPECIMEN)

## 2017-09-08 NOTE — Progress Notes (Addendum)
DEANTRE, BOURDON ([id number]) Visit Report for 09/05/2017 Chief Complaint Document Details Patient Name: [name], [name] Date of Service: 09/05/2017 9:00 AM Medical Record Number: [id number] Patient Account Number: [id number] Date of Birth/Sex: 1951/01/28 (67 y.o. Male) Treating RN: Ahmed Prima Primary Care Provider: Lamonte Sakai Other Clinician: Referring Provider: Lamonte Sakai Treating Provider/Extender: Cathie Olden in Treatment: 65 Information Obtained from: Patient Chief Complaint Patient presents for treatment of an open diabetic ulcer to both feet Electronic Signature(s) Signed: 09/05/2017 10:13:39 AM By: Lawanda Cousins Entered By: Lawanda Cousins on 09/05/2017 10:13:38 [name], [name] ([id number]) -------------------------------------------------------------------------------- HPI Details Patient Name: [name], [name] Date of Service: 09/05/2017 9:00 AM Medical Record Number: [id number] Patient Account Number: [id number] Date of Birth/Sex: 06-20-1950 (67 y.o. Male) Treating RN: Ahmed Prima Primary Care Provider: Lamonte Sakai Other Clinician: Referring Provider: Lamonte Sakai Treating Provider/Extender: Cathie Olden in Treatment: 75 History of Present Illness Location: bilateral feet ulceration on the toes Quality: Patient reports experiencing a dull pain to affected area(s). Severity: Patient states wound are getting better Duration: Patient has had the wound for > 3 months prior to seeking treatment at the wound center Timing: Pain in wound is constant (hurts all the time) Context: The wound would happen gradually Modifying Factors: Other treatment(s) tried include:treatment for lymphedema and is seen by the podiatrist Dr. Caryl Comes Associated Signs and Symptoms: Patient reports having increase swelling. HPI Description: 67 year old patient here to see as for bilateral feet ulceration to on his left first and second toe and 2 on his right first and second toe,  which she's had for about 4 months. 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 Donella Stade. The patient is also undergoing workup by medical oncology for a intra-abdominal lymphadenopathy. 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 He is also status post appendectomy, inguinal lymph node biopsy, rectal biopsy and rectal examination under anesthesia. he currently smokes cigarettes about half packet a day. In March of this year he was seen by Dr. Hortencia Pilar, for evaluation of bilateral varicose veins and besides wearing compression stockings he had recommended laser ablation of the right and left great saphenous veins to eleviate the symptoms and complications of severe superficial venous reflux disease. He also recommended lymphedema pumps for better control of his lymphedema. The patient recently has had on 08/23/2016, right greater saphenous vein ablation with the laser energy Earlier lower extremity venous reflux examination done on 05/08/2016 showed no DVT or SVT both lower legs but incompetence of bilateral great saphenous veins was present. A lower arterial study was also done and there was no significant right lower and left lower extremity problems based on a normal toe brachial index bilaterally and the ABI was 1.21 the left and 1.23 on the right. His post ablation venous duplex examination showed successful ablation of the right GS vein with thrombus formation 2 below the right saphenofemoral junction. The deep system was patent without evidence of thrombosis and this was done on 08/30/2016. the patient also has a squamous cell cancer of the skin of the buttock and is recently undergone radiation therapy for this prior to excisional  surgery. Addendum: regarding his x-rays done today and x-ray of the left foot -- IMPRESSION: No objective evidence of osteomyelitis. There are soft tissue changes which  may reflect cellulitis. X-ray of the right foot -- IMPRESSION:Findings compatible with cellulitis of the toes. No objective evidence of osteomyelitis is observed. 10/01/16 on evaluation today patient's wounds appeared to be doing some better. I did review the x-rays as well which showed no evidence of osteomyelitis although there was evidence on x-ray of cellulitis. He fortunately is not having any discomfort although he continues to have some swelling. He does not remember being on any antibiotics recently. 10/15/16 on evaluation today patient's wounds overall appear to be doing better although he does have a new location noted on the left foot. Fortunately he is not having significant pain. It almost has the appearance that something is rubbing on the end of his toes but he wears the open toe shoes and according to what he is telling me never wears anything that would rub on his foot. There is no evidence of infection and specifically no evidence of a fungal infection 10/22/16 On evaluation today patient's wounds appeared to be doing better compared to last week in regard to his bilateral Demorest, Eziah E. ([id number]) lower extremities. Fortunately I happy with how things are progressing although he still has ulcers I feel like that he is improving and appropriate manner. 11/12/16 on evaluation today patient appears to be doing well in regard to his bilateral feet and the respective wounds. We have been using surrounding her dressings along with an antifungal cream which seems to be doing very well. He has no bilateral dysfunction noticed that the rituals are weight loss at this point. He also has no nausea or vomiting a note purulent discharge. He did see Vein and vascular today and he tells me that they told him he could have surgery  for his venous stasis but they did not feel like it was worth it in his words. Fortunately patient's wounds do appear to be getting sneakily better. 11/26/2016 -- he says he is going to have some surgery during this week at [facility] for possibly a colon resection. 12/31/2016 -- the patient has been noncompliant with his smoking and I'm not sure whether he is also started drinking again. He continues to be very nonchalant about his care 01/14/2017 -- the patient's HandP has been reviewed well and I understand he is being compliant with trying to give up smoking and his local dressing changes. He does not have any surgical options of 4 to him by his vascular surgeons.he was last seen in early August by Dr. Hortencia Pilar who recommended compression stockings,and possibly lymph pumps in 2-3 months after doing a review ultrasound. 01/28/2017 - the patient did not have any fresh complaints but on examination I noted a large lacerated wound on the plantar aspect of his right fourth toe which had a lot of necrotic debris and it probes down to bone. 02/07/2017 -- x-ray of the right foot -- IMPRESSION: Soft tissue swelling about the first through fourth toes consistent with cellulitis. New destructive change in the tuft of the distal phalanx of the great toe is consistent with osteomyelitis. 02/14/2017 -- the patient's MRI is pending this coming Monday and he still continues to smoke. We have again gone over off loading of his wounds in great detail and he says he's been compliant. 02/21/2017 -- MR of the right foot -- IMPRESSION: 1. Soft tissue ulcer at the tip of the first, second and third toe knows. Cortical irregularity and bone marrow edema in the first distal phalanx most concerning for osteomyelitis. Mild marrow edema  in the second and third distal phalanx without definite cortical destruction which may reflect early osteomyelitis versus reactive marrow edema. 2. Soft tissue edema surrounding  the first phalanx most consistent with cellulitis. the patient was also recently evaluated by his medical oncologist Dr. 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 She is treating him with weekly Procrit. She is also keeping intra-abdominal lymphadenopathy and right lower lobe lung nodule under observation. 04/04/2017 -- he was seen by Dr. Adrian Prows on 03/25/2017 -- after review he empirically put him on ciprofloxacin and doxycycline as they have good bone penetration and good bioavailability and it will cover the usual pathogens and diabetic foot osteomyelitis. He will check inflammatory markers and plan a 74-64 week old records. C-reactive protein was 0.3 and the ESR was 72 04/18/17 on evaluation today patient appears to be doing about the same in regard to his lower extremity wounds bilaterally. He has continued to use the antifungal cream which does seem to be beneficial. Nonetheless the ulcers do seem to in some areas be epithelial eyes over and in other areas are still open. He is having no significant discomfort. 04/25/17-he is here in follow-up evaluation for multiple ulcerations to multiple toes bilaterally. He states he did see Dr. Ola Spurr again last week and continues antibiotic therapy. He is voicing no complaints or concerns, will continue with current treatment plan will possibility of adding compression therapy next week after an additional week of treatment/lotions to BLE prescribed by Dr Ola Spurr 05/02/17 he is here in follow up for for multiple ulcers to multiple toes bilaterally. we will stop using antifungal cream and will continue with silvercel and follow up next week 05/09/17-he is here in follow-up for multiple ulcerations to multiple toes bilaterally. There is improvement in appearance. He has not completely stopped using antifungal cream, but admits he has not using it between the toes. He has an  appointment with Dr. Ola Spurr on 2/11, continues on doxycycline and Cipro. It has been 5 weeks of antibiotic therapy, we will order plain film xray to evaluate for osteomyelitis next week, prior to follow up with ID. Will continue with silvercel and follow up next week 05/16/17-he is here in follow-up evaluation for multiple ulcerations to multiple toes bilaterally and new wound to the right posterior heel. There is essentially no change in appearance, deteriorating measurements; he has a history of waxing and waning measurements. He admits that he continues to apply moisturizer/cream/ointment to his toes despite weekly reminders to only apply silvercel to his toes. He states that he thinks the surgical shoe contributed to the superficial ulcer to his posterior [name], [name] ([id number]) heel, he is unable to articulate if this was an area of dry cracked skin as he has a similar area to the left heel. He now is wearing open toed slippers. He has an appointment with Dr. Ola Spurr on 2/11. We have ordered x-rays for her bilateral feet; he was advised to obtain the x-rays today or tomorrow. He will follow-up next week 05/23/17-he is here in follow-up evaluation for multiple ulcerations to multiple toes bilaterally and the right posterior heel. There is improvement in maceration. He has been compliant and not applying any moisturizing agent to his toes. He has been using Lac-Hydrin for his lower extremities with improvement. He did not go to his appointment on Monday with Dr. Ola Spurr secondary to financial concerns. X-rays for her bilateral feet showed: LEFT FOOT with slight  erosion of the tuft of the distal phalanges of the left first and second toe suspicious for osteomyelitis, RIGHT FOOT with 1.erosion of the tufts of the distal phalanges of the right first second and possibly third toes consistent with osteomyelitis, 2 no definitive abnormality of the calcaneus is seen on the images obtained, 3.  Plantar calcaneal degenerative spur. We briefly discussed hyperbaric adjunctive therapy for treatment of chronic refractory osteomyelitis. I do not find an a1c in EMR, will contact PCP for record, or order if needed. He has been encouraged to contact Dr Ola Spurr office regarding the follow-up appointment, encouraged him to inquire about payment plan. We will continue with same treatment plan and follow-up next week. He states he is still taking antibiotics and has "a lot" left. He states he has been taking them as directed, 2 pills twice daily. According to Dr. Blane Ohara office notes he was originally started on 12/17 for 4 weeks and extended on 1/14 for an additional 4 weeks. He should be done with his antibiotic therapy, he was advised to bring his bottles and to his next appointment, we will contact pharmacy. 05/30/17-he is here in follow-up evaluation for multiple ulcerations to multiple toes bilaterally and the right posterior heel. He is accompanied by his brother-in-law. Wounds are stable. He has yet to make up with Dr. Ola Spurr. We contacted his PCP, with no record of recent A1c we will draw an A1c. His brother-in-law states that he was taken off all of his diabetic medication secondary to kidney function. 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 The antibiotics were counted and have approximately 2 weeks left, although they should be complete. The pill organizer reveals missing days. We discussed the need for consistent medications, to have optimal benefit of medication. He has a cousin that lives with him and he will ask her to check his organizer daily. He has been advised to follow up with Dr Ola Spurr, and will go by the office today. He has been advised to quit smoking. 06/06/17-he is here in follow up evaluation. He has had to make an appointment with Dr. Ola Spurr. He did have  blood work obtained, a1c 5. He continues to take antibiotic therapy. Significant improvement in bilateral lower extremity edema with compression therapy. Essentially no change in ulcerations to toes. He states he is "going to try something different" and "let me know next week" if it works; he would not provide any additional information and was encouraged to follow our orders. We will follow up next week 06/13/17-he is here in follow-up evaluation. He has an appointment with Dr. Ola Spurr tomorrow morning. He states he purchased an ointment from [facility] and applied to his toes for 3 days, he does not remember the name of the ointment. There is improvement to his wounds, minimal maceration. He continues to take antibiotic therapy, this should have been completed last month. His brother-in-law who regularly accompanies his appointments was asked to take the bottles to the appointment tomorrow with Dr. Ola Spurr so he is aware. We will continue with 3 layer compression, and order OPEN TOE compression 20-30mmHg; we will apply compression stockings next week. He continues to smoke, smoked "2 cigarettes" last week 06/20/17 on evaluation today patient did receive his compression stockings which he has with him today for both lower extremities. With that being said he tells me at this point in time that he is very happy to have these he  really is not a big fan of the compression wraps that we have been utilizing although they have been of great benefit for him. Nonetheless at this point he does want to switch to the compression stockings. In my opinion as long as he is continuing with compression I'm okay with the stockings or the wraps. 06/27/17-he is here in follow-up evaluation for multiple ulcerations to his bilateral toes. There is some improvement in appearance. He is compliant in wearing his compression stockings with significant improvement in lower extremity edema. He saw Dr Ola Spurr on 3/8,  per his notes they would redraw ESR and CRP; plan to continue antibiotic therapy if these remain elevated. I do not see an ESR or CRP level in Epic. The patient continues to take antibiotics. 07/11/17-He is here in follow-up evaluation for multiple ulcerations to multiple toes bilaterally. He presents with complete epithelialization to the right third toe; there has been no deterioration. He continues on antibiotic therapy. He will follow-up next week 07/18/17-He is here in follow-up evaluation for multiple ulcerations to multiple toes bilaterally. He continues to make improvement. He continues on antibiotic therapy. He states he has been using something additional to our orders, he does not elaborate but states he will bring it in next week. 07/25/17-He is here in follow up evaluation for multiple ulcerations to bilateral toes. He is stable. He has completed antibiotic therapy. He admits to "filing" his toes after showers each evening, this is what he was referencing last week; he does not filing for the wounds. We will switch to [facility] and monitor for any improvement, he will follow-up next week 08/01/17-He is here in follow-up evaluation. He admits to "picking" at his toes after cleansing yesterday, leading to new areas of tissue loss on the bilateral second toe. There is improvement noted to the bilateral great toe. We will dress toes today and hope that they maintain until Monday where he will come in for a nurse visit. He has been advised, multiple times with expressed verbalization, to change the dressings to silvercel if the dressings get wet prior to [facility] appointment. [name], [name] ([id number]) 08/08/17-He is here in follow-up evaluation for bilateral first and second toe ulcerations. There is significant improvement to all ulcerations since last visit. We will switch to [facility] to all wounds and he will continue with nurse visits on a Monday/Thursday schedule and follow-up  with me in 2 weeks. He continues to smoke, 1-3 cigarettes per day, and has been encouraged to not smoke until his next follow-up in 2 weeks. 08/15/17 on evaluation today patient's ulcers on his toes actually appear to be doing fairly well the right to ulcers may be a little bit more moist compared to the left I'm not really sure exactly why as the openings appear to be very small and I'm not seeing any evidence of anything significant as far as you lightest or otherwise. Nonetheless this may just be a small setback he's been doing very well with the [facility] Dressing 08/22/17-He is here in follow-up evaluation for ulcerations to his bilateral first and second toes, there is small/scant amount of drainage noted on today's dressing. He continues with Hydrofera Blue. He continues to smoke, 1 cigarette a day. Voices no complaint or concerns, compliant and compression therapy today. He'll follow-up next week 08/29/17-He is here in follow-up evaluation for ulcerations to his bilateral first and second toes, with a new wound to the left third toe. He states he cut himself while cutting  the toenail. He presents today with more maceration and increased measurements; waxing and waning measurements and moisture has been an ongoing issue. He continues to smoke a proximally 1 cigarette per day, but states he has not smoked since Sunday. We will initiate medihoney daily and evaluate next week. He presents today without compression stockings 09/05/17-He is here in follow-up evaluation for ulcerations to bilateral first and second and left third toe. He did not pick up the medihoney and therefore has not been changing his dressing. He admits to inconsistent where of compression stockings, admits to pain to his bilateral lower extremities with unilateral edema (right greater than left). The ulcerations to multiple toes are more macerated and larger in size than last week. He is wearing compression therapy today. He  admits to an overall feeling of weakness and fatigue and has been encouraged to contact his PCP for evaluation; he has a known history of anemia and intraabdominal lymphadenopathy. We will return to silvercel and he will follow up next week Electronic Signature(s) Signed: 09/05/2017 10:22:57 AM By: Lawanda Cousins Entered By: Lawanda Cousins on 09/05/2017 10:22:57 [name], [name] ([id number]) -------------------------------------------------------------------------------- Physician Orders Details Patient Name: Elson, Jotham E. Date of Service: 09/05/2017 9:00 AM Medical Record Number: [id number] Patient Account Number: [id number] Date of Birth/Sex: [birth year] (67 y.o. Male) Treating RN: Ahmed Prima Primary Care Provider: Lamonte Sakai Other Clinician: Referring Provider: Lamonte Sakai Treating Provider/Extender: Cathie Olden in Treatment: 25 Verbal / Phone Orders: Yes Clinician: Carolyne Fiscal, Debi Read Back and Verified: Yes Diagnosis Coding Wound Cleansing Wound #1 Right Toe Great o Clean wound with Normal Saline. Wound #2 Right Toe Second o Clean wound with Normal Saline. Wound #5 Left Toe Great o Clean wound with Normal Saline. Wound #6 Left Toe Second o Clean wound with Normal Saline. Anesthetic (add to Medication List) Wound #1 Right Toe Great o Topical Lidocaine 4% cream applied to wound bed prior to debridement (In Clinic Only). Wound #2 Right Toe Second o Topical Lidocaine 4% cream applied to wound bed prior to debridement (In Clinic Only). Wound #5 Left Toe Great o Topical Lidocaine 4% cream applied to wound bed prior to debridement (In Clinic Only). Wound #6 Left Toe Second o Topical Lidocaine 4% cream applied to wound bed prior to debridement (In Clinic Only). Primary Wound Dressing Wound #1 Right Toe Great o Silver Alginate Wound #2 Right Toe Second o Silver Alginate Wound #5 Left Toe Great o Silver Alginate Wound #6 Left Toe Second o  Silver Alginate Secondary Dressing Wound #1 Right Toe Great o ABD pad o Dry Gauze [name], [name] ([id number]) Wound #2 Right Toe Second o ABD pad o Dry Gauze Wound #5 Left Toe Great o ABD pad o Dry Gauze Wound #6 Left Toe Second o ABD pad o Dry Gauze Dressing Change Frequency Wound #1 Right Toe Great o Change dressing every day. Wound #2 Right Toe Second o Change dressing every day. Wound #5 Left Toe Great o Change dressing every day. Wound #6 Left Toe Second o Change dressing every day. Follow-up Appointments Wound #1 Right Toe Great o Return Appointment in 1 week. Wound #2 Right Toe Second o Return Appointment in 1 week. Wound #5 Left Toe Great o Return Appointment in 1 week. Wound #6 Left Toe Second o Return Appointment in 1 week. Edema Control Wound #1 Right Toe Great o Patient to wear own compression stockings Wound #2 Right Toe Second o Patient to wear own compression stockings Wound #5 Left Toe Great o  Patient to wear own compression stockings Wound #6 Left Toe Second o Patient to wear own compression stockings Additional Orders / Instructions o Stop Smoking o Increase protein intake. CHRISTAN, DEFRANCO ([id number]) Laboratory o Bacteria identified in Wound by Culture (MICRO) - right 2nd toe oooo LOINC Code: 3704-8 oooo Convenience Name: Wound culture routine Services and Therapies o Venous Studies -Bilateral - ultrasound DVT study Patient Medications Allergies: No Known Drug Allergies Notifications Medication Indication Start End lidocaine DOSE 1 - topical 4 % cream - 1 cream topical clindamycin HCl 09/10/2017 DOSE 1 - oral 300 mg capsule - 1 capsule oral taken 3 times a day for 10 days Electronic Signature(s) Signed: 09/10/2017 4:51:26 PM By: Worthy Keeler PA-C Signed: 09/12/2017 4:23:13 PM By: Lawanda Cousins Previous Signature: 09/06/2017 6:54:27 AM Version By: Lawanda Cousins Previous Signature: 09/06/2017  5:38:20 PM Version By: Alric Quan Entered By: Worthy Keeler on 09/10/2017 16:51:24 Woehler, [name] ([id number]) -------------------------------------------------------------------------------- Prescription 09/05/2017 Patient Name: [name] Provider: Lawanda Cousins NP Date of Birth: [birth year] NPI#: [id number] Sex: Male DEA#: [id number] Phone #: [phone] License #: Patient Address: [facility] [place], [place] [address] [address], [place], [place] [address] [phone] Allergies No Known Drug Allergies Medication Medication: Route: Strength: Form: lidocaine 4 % topical cream topical 4% cream Class: TOPICAL LOCAL ANESTHETICS Dose: Frequency / Time: Indication: 1 1 cream topical Number of Refills: Number of Units: 0 Generic Substitution: Start Date: End Date: One Time Use: Substitution Permitted No Note to Pharmacy: Signature(s): Date(s): Electronic Signature(s) Signed: 09/11/2017 2:38:49 AM By: Worthy Keeler PA-C Signed: 09/12/2017 4:23:13 PM By: Lawanda Cousins Previous Signature: 09/06/2017 6:54:27 AM Version By: Lawanda Cousins Previous Signature: 09/06/2017 5:38:20 PM Version By: Alric Quan Entered By: Worthy Keeler on 09/10/2017 16:51:28 [name], [name] ([id number]) [name], [name] ([id number]) --------------------------------------------------------------------------------  Problem List Details Patient Name: Masso, Talmage E. Date of Service: 09/05/2017 9:00 AM Medical Record Number: [id number] Patient Account Number: [id number] Date of Birth/Sex: 01/31/1951 (67 y.o. Male) Treating RN: Ahmed Prima Primary Care Provider: Lamonte Sakai Other Clinician: Referring Provider: Lamonte Sakai Treating Provider/Extender: Cathie Olden in Treatment: 54 Active Problems ICD-10 Impacting Encounter Code Description Active Date Wound Healing  Diagnosis E11.621 Type 2 diabetes mellitus with foot ulcer 09/21/2016 No Yes I87.313 Chronic venous hypertension (idiopathic) with ulcer of 09/21/2016 No Yes bilateral lower extremity I89.0 Lymphedema, not elsewhere classified 09/21/2016 No Yes L97.522 Non-pressure chronic ulcer of other part of left foot with fat 09/21/2016 No Yes layer exposed L97.512 Non-pressure chronic ulcer of other part of right foot with fat 09/21/2016 No Yes layer exposed F17.218 Nicotine dependence, cigarettes, with other nicotine-induced 09/21/2016 No Yes disorders F10.19 Alcohol abuse with unspecified alcohol-induced disorder 09/21/2016 No Yes M86.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, right ankle and foot 02/21/2017 No Yes Inactive Problems Resolved Problems Electronic Signature(s) TABITHA, TUPPER ([id number]) Signed: 09/05/2017 9:40:49 AM By: Lawanda Cousins Entered By: Lawanda Cousins on 09/05/2017 09:40:49 [name], [name] ([id number]) -------------------------------------------------------------------------------- Progress Note Details Patient Name: Kenealy, Abayomi E. Date of Service: 09/05/2017 9:00 AM Medical Record Number: [id number] Patient Account Number: [id number] Date of Birth/Sex: November 24, 1950 (67 y.o. Male) Treating RN: Ahmed Prima Primary Care Provider: Lamonte Sakai Other Clinician: Referring Provider: Lamonte Sakai Treating Provider/Extender: Cathie Olden in Treatment: 32 Subjective Chief Complaint Information obtained from Patient Patient presents for treatment of an open diabetic ulcer to both feet History of Present Illness (HPI) The following HPI elements were  documented for the patient's wound: Location: bilateral feet ulceration on the toes Quality: Patient reports experiencing a dull pain to affected area(s). Severity: Patient states wound are getting better Duration: Patient has had the wound for > 3 months prior to seeking treatment at the wound center Timing: Pain in wound is constant  (hurts all the time) Context: The wound would happen gradually Modifying Factors: Other treatment(s) tried include:treatment for lymphedema and is seen by the podiatrist Dr. Caryl Comes Associated Signs and Symptoms: Patient reports having increase swelling. 67 year old patient here to see as for bilateral feet ulceration to on his left first and second toe and 2 on his right first and second toe, which she's had for about 4 months. 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 Donella Stade. The patient is also undergoing workup by medical oncology for a intra-abdominal lymphadenopathy. 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 He is also status post appendectomy, inguinal lymph node biopsy, rectal biopsy and rectal examination under anesthesia. he currently smokes cigarettes about half packet a day. In March of this year he was seen by Dr. Hortencia Pilar, for evaluation of bilateral varicose veins and besides wearing compression stockings he had recommended laser ablation of the right and left great saphenous veins to eleviate the symptoms and complications of severe superficial venous reflux disease. He also recommended lymphedema pumps for better control of his lymphedema. The patient recently has had on 08/23/2016, right greater saphenous vein ablation with the laser energy Earlier lower extremity venous reflux examination done on 05/08/2016 showed no DVT or SVT both lower legs but incompetence of bilateral great saphenous veins was present. A lower arterial study was also done and there was no significant right lower and left lower extremity problems based on a normal toe brachial index bilaterally and the ABI was 1.21 the left and 1.23 on the right. His  post ablation venous duplex examination showed successful ablation of the right GS vein with thrombus formation 2 below the right saphenofemoral junction. The deep system was patent without evidence of thrombosis and this was done on 08/30/2016. 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 Addendum: regarding his x-rays done today and x-ray of the left foot -- IMPRESSION: No objective evidence of osteomyelitis. There are soft tissue changes which may reflect cellulitis. X-ray of the right foot -- IMPRESSION:Findings compatible with cellulitis of the toes. No objective evidence of osteomyelitis is observed. 10/01/16 on evaluation today patient's wounds appeared to be doing some better. I did review the x-rays as well which showed no evidence of osteomyelitis although there was evidence on x-ray of cellulitis. He fortunately is not having any discomfort Whelpley, Stoney E. ([id number]) although he continues to have some swelling. He does not remember being on any antibiotics recently. 10/15/16 on evaluation today patient's wounds overall appear to be doing better although he does have a new location noted on the left foot. Fortunately he is not having significant pain. It almost has the appearance that something is rubbing on the end of his toes but he wears the open toe shoes and according to what he is telling me never wears anything that would rub on his  foot. There is no evidence of infection and specifically no evidence of a fungal infection 10/22/16 On evaluation today patient's wounds appeared to be doing better compared to last week in regard to his bilateral lower extremities. Fortunately I happy with how things are progressing although he still has ulcers I feel like that he is improving and appropriate manner. 11/12/16 on evaluation today patient appears to be doing well in regard to his  bilateral feet and the respective wounds. We have been using surrounding her dressings along with an antifungal cream which seems to be doing very well. He has no bilateral dysfunction noticed that the rituals are weight loss at this point. He also has no nausea or vomiting a note purulent discharge. He did see Vein and vascular today and he tells me that they told him he could have surgery for his venous stasis but they did not feel like it was worth it in his words. Fortunately patient's wounds do appear to be getting sneakily better. 11/26/2016 -- he says he is going to have some surgery during this week at [facility] for possibly a colon resection. 12/31/2016 -- the patient has been noncompliant with his smoking and I'm not sure whether he is also started drinking again. He continues to be very nonchalant about his care 01/14/2017 -- the patient's HandP has been reviewed well and I understand he is being compliant with trying to give up smoking and his local dressing changes. He does not have any surgical options of 4 to him by his vascular surgeons.he was last seen in early August by Dr. Hortencia Pilar who recommended compression stockings,and possibly lymph pumps in 2-3 months after doing a review ultrasound. 01/28/2017 - the patient did not have any fresh complaints but on examination I noted a large lacerated wound on the plantar aspect of his right fourth toe which had a lot of necrotic debris and it probes down to bone. 02/07/2017 -- x-ray of the right foot -- IMPRESSION: Soft tissue swelling about the first through fourth toes consistent with cellulitis. New destructive change in the tuft of the distal phalanx of the great toe is consistent with osteomyelitis. 02/14/2017 -- the patient's MRI is pending this coming Monday and he still continues to smoke. We have again gone over off loading of his wounds in great detail and he says he's been compliant. 02/21/2017 -- MR of the right  foot -- IMPRESSION: 1. Soft tissue ulcer at the tip of the first, second and third toe knows. Cortical irregularity and bone marrow edema in the first distal phalanx most concerning for osteomyelitis. Mild marrow edema in the second and third distal phalanx without definite cortical destruction which may reflect early osteomyelitis versus reactive marrow edema. 2. Soft tissue edema surrounding the first phalanx most consistent with cellulitis. the patient was also recently evaluated by his medical oncologist Dr. 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 She is treating him with weekly Procrit. She is also keeping intra-abdominal lymphadenopathy and right lower lobe lung nodule under observation. 04/04/2017 -- he was seen by Dr. Adrian Prows on 03/25/2017 -- after review he empirically put him on ciprofloxacin and doxycycline as they have good bone penetration and good bioavailability and it will cover the usual pathogens and diabetic foot osteomyelitis. He will check inflammatory markers and plan a 39-31 week old records. C-reactive protein was 0.3 and the ESR was 72 04/18/17 on evaluation today patient  appears to be doing about the same in regard to his lower extremity wounds bilaterally. He has continued to use the antifungal cream which does seem to be beneficial. Nonetheless the ulcers do seem to in some areas be epithelial eyes over and in other areas are still open. He is having no significant discomfort. 04/25/17-he is here in follow-up evaluation for multiple ulcerations to multiple toes bilaterally. He states he did see Dr. Ola Spurr again last week and continues antibiotic therapy. He is voicing no complaints or concerns, will continue with current treatment plan will possibility of adding compression therapy next week after an additional week of treatment/lotions to BLE prescribed by Dr Ola Spurr 05/02/17 he is  here in follow up for for multiple ulcers to multiple toes bilaterally. we will stop using antifungal cream and will continue with silvercel and follow up next week Donathan, Grove E. ([id number]) 05/09/17-he is here in follow-up for multiple ulcerations to multiple toes bilaterally. There is improvement in appearance. He has not completely stopped using antifungal cream, but admits he has not using it between the toes. He has an appointment with Dr. Ola Spurr on 2/11, continues on doxycycline and Cipro. It has been 5 weeks of antibiotic therapy, we will order plain film xray to evaluate for osteomyelitis next week, prior to follow up with ID. Will continue with silvercel and follow up next week 05/16/17-he is here in follow-up evaluation for multiple ulcerations to multiple toes bilaterally and new wound to the right posterior heel. There is essentially no change in appearance, deteriorating measurements; he has a history of waxing and waning measurements. He admits that he continues to apply moisturizer/cream/ointment to his toes despite weekly reminders to only apply silvercel to his toes. He states that he thinks the surgical shoe contributed to the superficial ulcer to his posterior heel, he is unable to articulate if this was an area of dry cracked skin as he has a similar area to the left heel. He now is wearing open toed slippers. He has an appointment with Dr. Ola Spurr on 2/11. We have ordered x-rays for her bilateral feet; he was advised to obtain the x-rays today or tomorrow. He will follow-up next week 05/23/17-he is here in follow-up evaluation for multiple ulcerations to multiple toes bilaterally and the right posterior heel. There is improvement in maceration. He has been compliant and not applying any moisturizing agent to his toes. He has been using Lac-Hydrin for his lower extremities with improvement. He did not go to his appointment on Monday with Dr. Ola Spurr secondary to financial  concerns. X-rays for her bilateral feet showed: LEFT FOOT with slight erosion of the tuft of the distal phalanges of the left first and second toe suspicious for osteomyelitis, RIGHT FOOT with 1.erosion of the tufts of the distal phalanges of the right first second and possibly third toes consistent with osteomyelitis, 2 no definitive abnormality of the calcaneus is seen on the images obtained, 3. Plantar calcaneal degenerative spur. We briefly discussed hyperbaric adjunctive therapy for treatment of chronic refractory osteomyelitis. I do not find an a1c in EMR, will contact PCP for record, or order if needed. He has been encouraged to contact Dr Ola Spurr office regarding the follow-up appointment, encouraged him to inquire about payment plan. We will continue with same treatment plan and follow-up next week. He states he is still taking antibiotics and has "a lot" left. He states he has been taking them as directed, 2 pills twice daily. According to Dr. Blane Ohara office notes he  was originally started on 12/17 for 4 weeks and extended on 1/14 for an additional 4 weeks. He should be done with his antibiotic therapy, he was advised to bring his bottles and to his next appointment, we will contact pharmacy. 05/30/17-he is here in follow-up evaluation for multiple ulcerations to multiple toes bilaterally and the right posterior heel. He is accompanied by his brother-in-law. Wounds are stable. He has yet to make up with Dr. Ola Spurr. We contacted his PCP, with no record of recent A1c we will draw an A1c. His brother-in-law states that he was taken off all of his diabetic medication secondary to kidney function. 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 The antibiotics were counted and have approximately 2 weeks left, although they should be complete. The pill organizer reveals missing days. We  discussed the need for consistent medications, to have optimal benefit of medication. He has a cousin that lives with him and he will ask her to check his organizer daily. He has been advised to follow up with Dr Ola Spurr, and will go by the office today. He has been advised to quit smoking. 06/06/17-he is here in follow up evaluation. He has had to make an appointment with Dr. Ola Spurr. He did have blood work obtained, a1c 5. He continues to take antibiotic therapy. Significant improvement in bilateral lower extremity edema with compression therapy. Essentially no change in ulcerations to toes. He states he is "going to try something different" and "let me know next week" if it works; he would not provide any additional information and was encouraged to follow our orders. We will follow up next week 06/13/17-he is here in follow-up evaluation. He has an appointment with Dr. Ola Spurr tomorrow morning. He states he purchased an ointment from [facility] and applied to his toes for 3 days, he does not remember the name of the ointment. There is improvement to his wounds, minimal maceration. He continues to take antibiotic therapy, this should have been completed last month. His brother-in-law who regularly accompanies his appointments was asked to take the bottles to the appointment tomorrow with Dr. Ola Spurr so he is aware. We will continue with 3 layer compression, and order OPEN TOE compression 20-30mmHg; we will apply compression stockings next week. He continues to smoke, smoked "2 cigarettes" last week 06/20/17 on evaluation today patient did receive his compression stockings which he has with him today for both lower extremities. With that being said he tells me at this point in time that he is very happy to have these he really is not a big fan of the compression wraps that we have been utilizing although they have been of great benefit for him. Nonetheless at this point he does want to  switch to the compression stockings. In my opinion as long as he is continuing with compression I'm okay with the stockings or the wraps. 06/27/17-he is here in follow-up evaluation for multiple ulcerations to his bilateral toes. There is some improvement in appearance. He is compliant in wearing his compression stockings with significant improvement in lower extremity edema. He saw Dr Ola Spurr on 3/8, per his notes they would redraw ESR and CRP; plan to continue antibiotic therapy if these remain elevated. I do not see an ESR or CRP level in Epic. The patient continues to take antibiotics. 07/11/17-He is here in follow-up evaluation for multiple ulcerations to multiple toes bilaterally. He presents with complete epithelialization to the  right third toe; there has been no deterioration. He continues on antibiotic therapy. He will follow-up next week 07/18/17-He is here in follow-up evaluation for multiple ulcerations to multiple toes bilaterally. He continues to make improvement. He continues on antibiotic therapy. He states he has been using something additional to our orders, he does Carriker, Yuriel E. ([id number]) not elaborate but states he will bring it in next week. 07/25/17-He is here in follow up evaluation for multiple ulcerations to bilateral toes. He is stable. He has completed antibiotic therapy. He admits to "filing" his toes after showers each evening, this is what he was referencing last week; he does not filing for the wounds. We will switch to [facility] and monitor for any improvement, he will follow-up next week 08/01/17-He is here in follow-up evaluation. He admits to "picking" at his toes after cleansing yesterday, leading to new areas of tissue loss on the bilateral second toe. There is improvement noted to the bilateral great toe. We will dress toes today and hope that they maintain until Monday where he will come in for a nurse visit. He has been advised, multiple times  with expressed verbalization, to change the dressings to silvercel if the dressings get wet prior to [facility] appointment. 08/08/17-He is here in follow-up evaluation for bilateral first and second toe ulcerations. There is significant improvement to all ulcerations since last visit. We will switch to [facility] to all wounds and he will continue with nurse visits on a Monday/Thursday schedule and follow-up with me in 2 weeks. He continues to smoke, 1-3 cigarettes per day, and has been encouraged to not smoke until his next follow-up in 2 weeks. 08/15/17 on evaluation today patient's ulcers on his toes actually appear to be doing fairly well the right to ulcers may be a little bit more moist compared to the left I'm not really sure exactly why as the openings appear to be very small and I'm not seeing any evidence of anything significant as far as you lightest or otherwise. Nonetheless this may just be a small setback he's been doing very well with the [facility] Dressing 08/22/17-He is here in follow-up evaluation for ulcerations to his bilateral first and second toes, there is small/scant amount of drainage noted on today's dressing. He continues with Hydrofera Blue. He continues to smoke, 1 cigarette a day. Voices no complaint or concerns, compliant and compression therapy today. He'll follow-up next week 08/29/17-He is here in follow-up evaluation for ulcerations to his bilateral first and second toes, with a new wound to the left third toe. He states he cut himself while cutting the toenail. He presents today with more maceration and increased measurements; waxing and waning measurements and moisture has been an ongoing issue. He continues to smoke a proximally 1 cigarette per day, but states he has not smoked since Sunday. We will initiate medihoney daily and evaluate next week. He presents today without compression stockings 09/05/17-He is here in follow-up evaluation for ulcerations to  bilateral first and second and left third toe. He did not pick up the medihoney and therefore has not been changing his dressing. He admits to inconsistent where of compression stockings, admits to pain to his bilateral lower extremities with unilateral edema (right greater than left). The ulcerations to multiple toes are more macerated and larger in size than last week. He is wearing compression therapy today. He admits to an overall feeling of weakness and fatigue and has been encouraged to contact his PCP for evaluation;  he has a known history of anemia and intraabdominal lymphadenopathy. We will return to silvercel and he will follow up next week Patient History Information obtained from Patient. Social History Current every day smoker, Marital Status - Widowed, Alcohol Use - Daily - quit drinking about a week ago, Drug Use - No History, Caffeine Use - Moderate. Medical And Surgical History Notes Oncologic 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 Objective Constitutional Vitals Time Taken: 9:00 AM, Height: 69 in, Weight: 168 lbs, BMI: 24.8, Temperature: 98.0 F, Pulse: 65 bpm, Respiratory Rate: 16 breaths/min, Blood Pressure: 162/64 mmHg. Garlow, Jaimes E. ([id number]) Integumentary (Hair, Skin) Wound #1 status is Open. Original cause of wound was Gradually Appeared. The wound is located on the Right Toe Great. The wound measures 1.8cm length x 1.2cm width x 0.1cm depth; 1.696cm^2 area and 0.17cm^3 volume. There is Fat Layer (Subcutaneous Tissue) Exposed exposed. There is no tunneling or undermining noted. There is a medium amount of serosanguineous drainage noted. The wound margin is indistinct and nonvisible. There is large (67-100%) red granulation within the wound bed. There is a small (1-33%) amount of necrotic tissue within the wound bed including Adherent Slough. The periwound skin appearance did not exhibit: Callus, Crepitus, Excoriation, Induration, Rash,  Scarring, Dry/Scaly, Maceration, Atrophie Blanche, Cyanosis, Ecchymosis, Hemosiderin Staining, Mottled, Pallor, Rubor, Erythema. Periwound temperature was noted as No Abnormality. Wound #13 status is Open. Original cause of wound was Trauma. The wound is located on the Left Toe Third. The wound measures 0.7cm length x 0.5cm width x 0.1cm depth; 0.275cm^2 area and 0.027cm^3 volume. There is no tunneling or undermining noted. There is a medium amount of serous drainage noted. The wound margin is flat and intact. There is large (67-100%) red granulation within the wound bed. There is no necrotic tissue within the wound bed. The periwound skin appearance did not exhibit: Callus, Crepitus, Excoriation, Induration, Rash, Scarring, Dry/Scaly, Maceration, Atrophie Blanche, Cyanosis, Ecchymosis, Hemosiderin Staining, Mottled, Pallor, Rubor, Erythema. Wound #2 status is Open. Original cause of wound was Gradually Appeared. The wound is located on the Right Toe Second. The wound measures 1.3cm length x 2cm width x 0.1cm depth; 2.042cm^2 area and 0.204cm^3 volume. There is no tunneling or undermining noted. There is a small amount of serous drainage noted. The wound margin is flat and intact. There is large (67-100%) red granulation within the wound bed. There is no necrotic tissue within the wound bed. The periwound skin appearance did not exhibit: Callus, Crepitus, Excoriation, Induration, Rash, Scarring, Dry/Scaly, Maceration, Atrophie Blanche, Cyanosis, Ecchymosis, Hemosiderin Staining, Mottled, Pallor, Rubor, Erythema. Periwound temperature was noted as No Abnormality. Wound #5 status is Open. Original cause of wound was Gradually Appeared. The wound is located on the Left Toe Great. The wound measures 0.6cm length x 0.6cm width x 0.1cm depth; 0.283cm^2 area and 0.028cm^3 volume. There is a medium amount of serous drainage noted. The wound margin is flat and intact. There is large (67-100%) red  granulation within the wound bed. There is no necrotic tissue within the wound bed. The periwound skin appearance did not exhibit: Callus, Crepitus, Excoriation, Induration, Rash, Scarring, Dry/Scaly, Maceration, Atrophie Blanche, Cyanosis, Ecchymosis, Hemosiderin Staining, Mottled, Pallor, Rubor, Erythema. Periwound temperature was noted as No Abnormality. Wound #6 status is Open. Original cause of wound was Gradually Appeared. The wound is located on the Left Toe Second. The wound measures 0.8cm length x 0.8cm width x 0.1cm depth; 0.503cm^2 area and 0.05cm^3 volume. There is no tunneling or undermining noted.  There is a medium amount of serous drainage noted. The wound margin is flat and intact. There is large (67-100%) red granulation within the wound bed. There is no necrotic tissue within the wound bed. The periwound skin appearance did not exhibit: Callus, Crepitus, Excoriation, Induration, Rash, Scarring, Dry/Scaly, Maceration, Atrophie Blanche, Cyanosis, Ecchymosis, Hemosiderin Staining, Mottled, Pallor, Rubor, Erythema. Periwound temperature was noted as No Abnormality. 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 ([id number]) Plan Wound Cleansing: Wound #1 Right Toe Great: Clean wound with Normal Saline. Wound #2 Right Toe Second: Clean wound with Normal Saline. Wound #5 Left Toe Great: Clean wound with Normal  Saline. Wound #6 Left Toe Second: Clean wound with Normal Saline. Anesthetic (add to Medication List): Wound #1 Right Toe Great: Topical Lidocaine 4% cream applied to wound bed prior to debridement (In Clinic Only). Wound #2 Right Toe Second: Topical Lidocaine 4% cream applied to wound bed prior to debridement (In Clinic Only). Wound #5 Left Toe Great: Topical Lidocaine 4% cream applied to wound bed prior to debridement (In Clinic Only). Wound #6 Left Toe Second: Topical Lidocaine 4% cream applied to wound bed prior to debridement (In Clinic Only). Primary Wound Dressing: Wound #1 Right Toe Great: Silver Alginate Wound #2 Right Toe Second: Silver Alginate Wound #5 Left Toe Great: Silver Alginate Wound #6 Left Toe Second: Silver Alginate Secondary Dressing: Wound #1 Right Toe Great: ABD pad Dry Gauze Wound #2 Right Toe Second: ABD pad Dry Gauze Wound #5 Left Toe Great: ABD pad Dry Gauze Wound #6 Left Toe Second: ABD pad Dry Gauze Dressing Change Frequency: Wound #1 Right Toe Great: Change dressing every day. Wound #2 Right Toe Second: Change dressing every day. Wound #5 Left Toe Great: Change dressing every day. Wound #6 Left Toe Second: Change dressing every day. Follow-up Appointments: Wound #1 Right Toe Great: Return Appointment in 1 week. Wound #2 Right Toe Second: Return Appointment in 1 week. TRELLIS, VANOVERBEKE ([id number]) Wound #5 Left Toe Great: Return Appointment in 1 week. Wound #6 Left Toe Second: Return Appointment in 1 week. Edema Control: Wound #1 Right Toe Great: Patient to wear own compression stockings Wound #2 Right Toe Second: Patient to wear own compression stockings Wound #5 Left Toe Great: Patient to wear own compression stockings Wound #6 Left Toe Second: Patient to wear own compression stockings Additional Orders / Instructions: Stop Smoking Increase protein intake. Laboratory ordered were: Wound culture routine - right 2nd  toe Services and Therapies ordered were: Venous Studies -Bilateral - ultrasound DVT study The following medication(s) was prescribed: lidocaine topical 4 % cream 1 1 cream topical was prescribed at facility clindamycin HCl oral 300 mg capsule 1 1 capsule oral taken 3 times a day for 10 days starting 09/10/2017 Electronic Signature(s) Signed: 09/11/2017 2:38:49 AM By: Worthy Keeler PA-C Signed: 09/12/2017 4:23:13 PM By: Lawanda Cousins Previous Signature: 09/06/2017 6:55:14 AM Version By: Lawanda Cousins Previous Signature: 09/05/2017 10:24:24 AM Version By: Lawanda Cousins Entered By: Worthy Keeler on 09/10/2017 16:51:56 Bernasconi, Adryan E. (  [id number]) -------------------------------------------------------------------------------- ROS/PFSH Details Patient Name: Mccloud, RAMONA SLINGER. Date of Service: 09/05/2017 9:00 AM Medical Record Number: [id number] Patient Account Number: [id number] Date of Birth/Sex: 08-Jul-1950 (67 y.o. Male) Treating RN: Ahmed Prima Primary Care Provider: Lamonte Sakai Other Clinician: Referring Provider: Lamonte Sakai Treating Provider/Extender: Cathie Olden in Treatment: 74 Information Obtained From Patient Wound History Do you currently have one or more open woundso Yes How many open wounds do you currently haveo 6 Approximately how long have you had your woundso 3 months How have you been treating your wound(s) until nowo ointment and bandage Has your wound(s) ever healed and then re-openedo No Have you had any lab work done in the past montho No Have you tested positive for an antibiotic resistant organism (MRSA, VRE)o No Have you tested positive for osteomyelitis (bone infection)o No Have you had any tests for circulation on your legso Yes Who ordered the testo PCP Where was the test doneo [facility] Medical History: Negative for: Cataracts; Glaucoma; Optic Neuritis Ear/Nose/Mouth/Throat Medical History: Negative for: Chronic sinus problems/congestion; Middle  ear problems Hematologic/Lymphatic Medical History: Positive for: Anemia; Lymphedema Negative for: Hemophilia; Human Immunodeficiency Virus; Sickle Cell Disease Respiratory Medical History: Negative for: Aspiration; Asthma; Chronic Obstructive Pulmonary Disease (COPD); Pneumothorax; Sleep Apnea; Tuberculosis Cardiovascular Medical History: Positive for: Congestive Heart Failure; Hypertension; Peripheral Venous Disease Negative for: Angina; Arrhythmia; Coronary Artery Disease; Deep Vein Thrombosis; Hypotension; Myocardial Infarction; Peripheral Arterial Disease; Phlebitis; Vasculitis Gastrointestinal Medical History: Negative for: Cirrhosis ; Colitis; Crohnos; Hepatitis A; Hepatitis B; Hepatitis C Klees, Chad E. ([id number]) Endocrine Medical History: Positive for: Type II Diabetes Treated with: Oral agents Blood sugar tested every day: Yes Tested : QD Genitourinary Medical History: Negative for: End Stage Renal Disease Immunological Medical History: Negative for: Lupus Erythematosus; Raynaudos; Scleroderma Integumentary (Skin) Medical History: Negative for: History of Burn; History of pressure wounds Musculoskeletal Medical History: Negative for: Gout; Rheumatoid Arthritis; Osteoarthritis; Osteomyelitis Neurologic Medical History: Positive for: Neuropathy Negative for: Dementia; Quadriplegia; Paraplegia; Seizure Disorder Oncologic Medical History: Past Medical History Notes: 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 Electronic Signature(s) Signed: 09/06/2017 6:54:27 AM By: Lawanda Cousins Signed: 09/06/2017 5:38:20 PM By: Warnell Bureau ([id number]) Entered By: Lawanda Cousins on 09/05/2017 10:24:05 Roedl, [name] ([id number]) -------------------------------------------------------------------------------- SuperBill Details Patient Name: Isip, Delos E. Date of Service: 09/05/2017 Medical Record Number: [id number] Patient Account Number: [id number] Date of Birth/Sex: 1951/03/02 (67 y.o. Male) Treating RN: Ahmed Prima Primary Care Provider: Lamonte Sakai Other Clinician: Referring Provider: Lamonte Sakai Treating Provider/Extender: Cathie Olden in Treatment: 27 Diagnosis Coding ICD-10 Codes Code Description E11.621 Type 2 diabetes mellitus with foot ulcer I87.313 Chronic venous hypertension (idiopathic) with ulcer of bilateral lower extremity I89.0 Lymphedema, not elsewhere classified L97.522 Non-pressure chronic ulcer of other part of left foot with fat layer exposed L97.512 Non-pressure chronic ulcer of other part of right foot with fat layer exposed F17.218 Nicotine dependence, cigarettes, 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 abuse 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, right ankle  and foot Facility Procedures CPT4 Code: [id number] Description: [phone] - WOUND CARE VISIT-LEV 5 EST PT Modifier: Quantity: 1 Physician Procedures CPT4 Code: [id number] Description: 67619 - WC PHYS LEVEL 3 - EST PT ICD-10 Diagnosis Description L97.522 Non-pressure chronic ulcer of other part of left foot with fat L97.512 Non-pressure chronic ulcer of other part of right foot with fat I89.0 Lymphedema, not elsewhere  classified E11.621 Type 2 diabetes mellitus with foot ulcer Modifier: layer  exposed layer exposed Quantity: 1 Electronic Signature(s) Signed: 09/05/2017 5:09:53 PM By: Alric Quan Signed: 09/06/2017 6:54:27 AM By: Lawanda Cousins Previous Signature: 09/05/2017 10:24:55 AM Version By: Lawanda Cousins Entered By: Alric Quan on 09/05/2017 17:09:53

## 2017-09-08 NOTE — Progress Notes (Signed)
[name], [name] ([id number]) Visit Report for 09/05/2017 Arrival Information Details Patient Name: [name], [name] Date of Service: 09/05/2017 9:00 AM Medical Record Number: [id number] Patient Account Number: [id number] Date of Birth/Sex: [birth year] (67 y.o. M) Treating RN: Montey Hora Primary Care Melodee Lupe: Lamonte Sakai Other Clinician: Referring Keairra Bardon: Lamonte Sakai Treating Rayquon Uselman/Extender: Cathie Olden in Treatment: 53 Visit Information History Since Last Visit Added or deleted any medications: No Patient Arrived: Ambulatory Any new allergies or adverse reactions: No Arrival Time: 09:01 Had a fall or experienced change in No Accompanied By: brother in activities of daily living that may affect law risk of falls: Transfer Assistance: None Signs or symptoms of abuse/neglect since last visito No Patient Identification Verified: Yes Hospitalized since last visit: No Secondary Verification Process Completed: Yes Implantable device outside of the clinic excluding No Patient Requires Transmission-Based No cellular tissue based products placed in the center Precautions: since last visit: Patient Has Alerts: Yes Has Dressing in Place as Prescribed: Yes Patient Alerts: DMII Pain Present Now: No Electronic Signature(s) Signed: 09/05/2017 4:32:43 PM By: Montey Hora Entered By: Montey Hora on 09/05/2017 09:02:06 [name], [name] Kitchen ([id number]) -------------------------------------------------------------------------------- Clinic Level of Care Assessment Details Patient Name: [name], [name] Date of Service: 09/05/2017 9:00 AM Medical Record Number: [id number] Patient Account Number: [id number] Date of Birth/Sex: [birth year] (67 y.o. M) Treating RN: Ahmed Prima Primary Care Chisum Habenicht: Lamonte Sakai Other Clinician: Referring Analisse Randle: Lamonte Sakai Treating Cloys Vera/Extender: Cathie Olden in Treatment: 22 Clinic Level of Care Assessment Items TOOL 4 Quantity  Score X - Use when only an EandM is performed on FOLLOW-UP visit 1 0 ASSESSMENTS - Nursing Assessment / Reassessment X - Reassessment of Co-morbidities (includes updates in patient status) 1 10 X- 1 5 Reassessment of Adherence to Treatment Plan ASSESSMENTS - Wound and Skin Assessment / Reassessment []  - Simple Wound Assessment / Reassessment - one wound 0 X- 5 5 Complex Wound Assessment / Reassessment - multiple wounds []  - 0 Dermatologic / Skin Assessment (not related to wound area) ASSESSMENTS - Focused Assessment []  - Circumferential Edema Measurements - multi extremities 0 []  - 0 Nutritional Assessment / Counseling / Intervention []  - 0 Lower Extremity Assessment (monofilament, tuning fork, pulses) []  - 0 Peripheral Arterial Disease Assessment (using hand held doppler) ASSESSMENTS - Ostomy and/or Continence Assessment and Care []  - Incontinence Assessment and Management 0 []  - 0 Ostomy Care Assessment and Management (repouching, etc.) PROCESS - Coordination of Care X - Simple Patient / Family Education for ongoing care 1 15 []  - 0 Complex (extensive) Patient / Family Education for ongoing care []  - 0 Staff obtains Programmer, systems, Records, Test Results / Process Orders []  - 0 Staff telephones HHA, Nursing Homes / Clarify orders / etc []  - 0 Routine Transfer to another Facility (non-emergent condition) []  - 0 Routine Hospital Admission (non-emergent condition) []  - 0 New Admissions / Biomedical engineer / Ordering NPWT, Apligraf, etc. []  - 0 Emergency Hospital Admission (emergent condition) X- 1 10 Simple Discharge Coordination [name], [name] ([id number]) []  - 0 Complex (extensive) Discharge Coordination PROCESS - Special Needs []  - Pediatric / Minor Patient Management 0 []  - 0 Isolation Patient Management []  - 0 Hearing / Language / Visual special needs []  - 0 Assessment of Community assistance (transportation, D/C planning, etc.) []  - 0 Additional assistance /  Altered mentation []  - 0 Support Surface(s) Assessment (bed, cushion, seat, etc.) INTERVENTIONS - Wound Cleansing / Measurement []  - Simple Wound Cleansing - one wound 0 X- 5 5  Complex Wound Cleansing - multiple wounds X- 1 5 Wound Imaging (photographs - any number of wounds) []  - 0 Wound Tracing (instead of photographs) []  - 0 Simple Wound Measurement - one wound X- 5 5 Complex Wound Measurement - multiple wounds INTERVENTIONS - Wound Dressings []  - Small Wound Dressing one or multiple wounds 0 X- 2 15 Medium Wound Dressing one or multiple wounds []  - 0 Large Wound Dressing one or multiple wounds X- 1 5 Application of Medications - topical []  - 0 Application of Medications - injection INTERVENTIONS - Miscellaneous []  - External ear exam 0 []  - 0 Specimen Collection (cultures, biopsies, blood, body fluids, etc.) []  - 0 Specimen(s) / Culture(s) sent or taken to Lab for analysis []  - 0 Patient Transfer (multiple staff / Civil Service fast streamer / Similar devices) []  - 0 Simple Staple / Suture removal (25 or less) []  - 0 Complex Staple / Suture removal (26 or more) []  - 0 Hypo / Hyperglycemic Management (close monitor of Blood Glucose) []  - 0 Ankle / Brachial Index (ABI) - do not check if billed separately X- 1 5 Vital Signs [name], [name] ([id number]) Has the patient been seen at the hospital within the last three years: Yes Total Score: 160 Level Of Care: New/Established - Level 5 Electronic Signature(s) Signed: 09/06/2017 5:38:20 PM By: Alric Quan Entered By: Alric Quan on 09/05/2017 17:09:38 [name], [name] ([id number]) -------------------------------------------------------------------------------- Encounter Discharge Information Details Patient Name: [name], [name] Date of Service: 09/05/2017 9:00 AM Medical Record Number: [id number] Patient Account Number: [id number] Date of Birth/Sex: March 30, 1951 (67 y.o. M) Treating RN: Roger Shelter Primary Care Natalea Sutliff:  Lamonte Sakai Other Clinician: Referring Maleik Vanderzee: Lamonte Sakai Treating Xavious Sharrar/Extender: Cathie Olden in Treatment: 3 Encounter Discharge Information Items Discharge Condition: Stable Ambulatory Status: Ambulatory Discharge Destination: Home Transportation: Private Auto Schedule Follow-up Appointment: Yes Clinical Summary of Care: Electronic Signature(s) Signed: 09/05/2017 11:59:50 AM By: Roger Shelter Entered By: Roger Shelter on 09/05/2017 09:48:06 Barbar, [name] ([id number]) -------------------------------------------------------------------------------- Lower Extremity Assessment Details Patient Name: Steinmetz, Telvin [name]. Date of Service: 09/05/2017 9:00 AM Medical Record Number: [id number] Patient Account Number: [id number] Date of Birth/Sex: 03/16/1951 (67 y.o. M) Treating RN: Montey Hora Primary Care Ladamien Rammel: Lamonte Sakai Other Clinician: Referring Marieclaire Bettenhausen: Lamonte Sakai Treating Arun Herrod/Extender: Cathie Olden in Treatment: 49 Edema Assessment Assessed: [Left: No] [Right: No] [Left: Edema] [Right: :] Calf Left: Right: Point of Measurement: 34 cm From Medial Instep 33.7 cm 37 cm Ankle Left: Right: Point of Measurement: 12 cm From Medial Instep 24.5 cm 25 cm Vascular Assessment Claudication: Claudication Assessment [Left:None] Pulses: Dorsalis Pedis Palpable: [Left:Yes] [Right:Yes] Posterior Tibial Extremity colors, hair growth, and conditions: Hair Growth on Extremity: [Left:Yes] [Right:Yes] Temperature of Extremity: [Left:Warm] [Right:Warm] Capillary Refill: [Left:< 3 seconds] [Right:< 3 seconds] Toe Nail Assessment Left: Right: Thick: Yes Yes Discolored: Yes Yes Deformed: No No Improper Length and Hygiene: No No Electronic Signature(s) Signed: 09/05/2017 4:32:43 PM By: Montey Hora Entered By: Montey Hora on 09/05/2017 09:14:04 [name], [name]  ([id number]) -------------------------------------------------------------------------------- Multi Wound Chart Details Patient Name: [name], [name] Date of Service: 09/05/2017 9:00 AM Medical Record Number: [id number] Patient Account Number: [id number] Date of Birth/Sex: 03-May-1950 (67 y.o. M) Treating RN: Ahmed Prima Primary Care Karenann Mcgrory: Lamonte Sakai Other Clinician: Referring Kolbi Tofte: Lamonte Sakai Treating Kashonda Sarkisyan/Extender: Cathie Olden in Treatment: 45 Vital Signs Height(in): 69 Pulse(bpm): 65 Weight(lbs): 168 Blood Pressure(mmHg): 162/64 Body Mass Index(BMI): 25 Temperature(F): 98.0 Respiratory Rate 16 (breaths/min): Photos: [1:No Photos] [13:No Photos] [2:No Photos] Wound Location: [1:Right  Toe Great] [13:Left Toe Third] [2:Right Toe Second] Wounding Event: [1:Gradually Appeared] [13:Trauma] [2:Gradually Appeared] Primary Etiology: [1:Diabetic Wound/Ulcer of the Lower Extremity] [13:Diabetic Wound/Ulcer of the Lower Extremity] [2:Diabetic Wound/Ulcer of the Lower Extremity] Comorbid History: [1:Anemia, Lymphedema, Congestive Heart Failure, Hypertension, Peripheral Venous Disease, Type II Diabetes, Neuropathy] [13:Anemia, Lymphedema, Congestive Heart Failure, Hypertension, Peripheral Venous Disease, Type II Diabetes,  Neuropathy] [2:Anemia, Lymphedema, Congestive Heart Failure, Hypertension, Peripheral Venous Disease, Type II Diabetes, Neuropathy] Date Acquired: [1:06/11/2016] [13:08/29/2017] [2:06/11/2016] Weeks of Treatment: [1:49] [13:1] [2:49] Wound Status: [1:Open] [13:Open] [2:Open] Pending Amputation on [1:Yes] [13:No] [2:Yes] Presentation: Measurements L x W x D [1:1.8x1.2x0.1] [13:0.7x0.5x0.1] [2:1.3x2x0.1] (cm) Area (cm) : [1:1.696] [13:0.275] [2:2.042] Volume (cm) : [1:0.17] [13:0.027] [2:0.204] % Reduction in Area: [1:96.40%] [13:-3337.50%] [2:33.50%] % Reduction in Volume: [1:96.40%] [13:-2600.00%] [2:33.60%] Classification: [1:Grade 2] [13:Grade  1] [2:Grade 2] Exudate Amount: [1:Medium] [13:Medium] [2:Small] Exudate Type: [1:Serosanguineous] [13:Serous] [2:Serous] Exudate Color: [1:red, brown] [13:amber] [2:amber] Wound Margin: [1:Indistinct, nonvisible] [13:Flat and Intact] [2:Flat and Intact] Granulation Amount: [1:Large (67-100%)] [13:Large (67-100%)] [2:Large (67-100%)] Granulation Quality: [1:Red] [13:Red] [2:Red] Necrotic Amount: [1:Small (1-33%)] [13:None Present (0%)] [2:None Present (0%)] Exposed Structures: [1:Fat Layer (Subcutaneous Tissue) Exposed: Yes Fascia: No Tendon: No Muscle: No Joint: No Bone: No] [13:Fascia: No Fat Layer (Subcutaneous Tissue) Exposed: No Tendon: No Muscle: No Joint: No Bone: No] [2:Fascia: No Fat Layer (Subcutaneous Tissue)  Exposed: No Tendon: No Muscle: No Joint: No Bone: No] Epithelialization: [1:Medium (34-66%)] [13:Small (1-33%)] [2:Large (67-100%)] Periwound Skin Texture: Excoriation: No Excoriation: No Excoriation: No Induration: No Induration: No Induration: No Callus: No Callus: No Callus: No Crepitus: No Crepitus: No Crepitus: No Rash: No Rash: No Rash: No Scarring: No Scarring: No Scarring: No Periwound Skin Moisture: Maceration: No Maceration: No Maceration: No Dry/Scaly: No Dry/Scaly: No Dry/Scaly: No Periwound Skin Color: Atrophie Blanche: No Atrophie Blanche: No Atrophie Blanche: No Cyanosis: No Cyanosis: No Cyanosis: No Ecchymosis: No Ecchymosis: No Ecchymosis: No Erythema: No Erythema: No Erythema: No Hemosiderin Staining: No Hemosiderin Staining: No Hemosiderin Staining: No Mottled: No Mottled: No Mottled: No Pallor: No Pallor: No Pallor: No Rubor: No Rubor: No Rubor: No Temperature: No Abnormality N/A No Abnormality Tenderness on Palpation: No No No Wound Preparation: Ulcer Cleansing: Ulcer Cleansing: Ulcer Cleansing: Rinsed/Irrigated with Saline Rinsed/Irrigated with Saline Rinsed/Irrigated with Saline Topical Anesthetic  Applied: Topical Anesthetic Applied: Topical Anesthetic Applied: Other: lidocaine 4% Other: lidocaine 4% Other: lidocaine 4% Wound Number: 5 6 N/A Photos: No Photos No Photos N/A Wound Location: Left Toe Great Left Toe Second N/A Wounding Event: Gradually Appeared Gradually Appeared N/A Primary Etiology: Diabetic Wound/Ulcer of the Diabetic Wound/Ulcer of the N/A Lower Extremity Lower Extremity Comorbid History: N/A Anemia, Lymphedema, N/A Congestive Heart Failure, Hypertension, Peripheral Venous Disease, Type II Diabetes, Neuropathy Date Acquired: 06/11/2016 06/11/2016 N/A Weeks of Treatment: 49 49 N/A Wound Status: Open Open N/A Pending Amputation on Yes Yes N/A Presentation: Measurements L x W x D 0.6x0.6x0.1 0.8x0.8x0.1 N/A (cm) Area (cm) : 0.283 0.503 N/A Volume (cm) : 0.028 0.05 N/A % Reduction in Area: 98.00% 76.50% N/A % Reduction in Volume: 98.00% 76.60% N/A Classification: Grade 2 Grade 2 N/A Exudate Amount: N/A Medium N/A Exudate Type: N/A Serous N/A Exudate Color: N/A amber N/A Wound Margin: N/A Flat and Intact N/A Granulation Amount: N/A Large (67-100%) N/A Granulation Quality: N/A Red N/A Necrotic Amount: N/A None Present (0%) N/A Exposed Structures: N/A Fascia: No N/A Fat Layer (Subcutaneous Tissue) Exposed: No Tendon: No [name], [name] ([id number]) Muscle: No Joint: No Bone: No Epithelialization: N/A  Large (67-100%) N/A Periwound Skin Texture: No Abnormalities Noted Excoriation: No N/A Induration: No Callus: No Crepitus: No Rash: No Scarring: No Periwound Skin Moisture: No Abnormalities Noted Maceration: No N/A Dry/Scaly: No Periwound Skin Color: No Abnormalities Noted Atrophie Blanche: No N/A Cyanosis: No Ecchymosis: No Erythema: No Hemosiderin Staining: No Mottled: No Pallor: No Rubor: No Temperature: N/A No Abnormality N/A Tenderness on Palpation: No No N/A Wound Preparation: N/A Ulcer Cleansing: N/A Rinsed/Irrigated with  Saline Topical Anesthetic Applied: Other: lidocaine 4% Treatment Notes Wound #1 (Right Toe Great) 1. Cleansed with: Clean wound with Normal Saline 2. Anesthetic Topical Lidocaine 4% cream to wound bed prior to debridement 4. Dressing Applied: Other dressing (specify in notes) 5. Secondary Dressing Applied ABD Pad Dry Gauze Notes silvercell, gauze and conform Wound #13 (Left Toe Third) 1. Cleansed with: Clean wound with Normal Saline 2. Anesthetic Topical Lidocaine 4% cream to wound bed prior to debridement 4. Dressing Applied: Other dressing (specify in notes) 5. Secondary Dressing Applied ABD Pad Dry Gauze Notes silvercell, gauze and conform Normington, Ferdinand [name]. ([id number]) Wound #2 (Right Toe Second) 1. Cleansed with: Clean wound with Normal Saline 2. Anesthetic Topical Lidocaine 4% cream to wound bed prior to debridement 4. Dressing Applied: Other dressing (specify in notes) 5. Secondary Dressing Applied ABD Pad Dry Gauze Notes silvercell, gauze and conform Wound #5 (Left Toe Great) 1. Cleansed with: Clean wound with Normal Saline 2. Anesthetic Topical Lidocaine 4% cream to wound bed prior to debridement 4. Dressing Applied: Other dressing (specify in notes) 5. Secondary Dressing Applied ABD Pad Dry Gauze Notes silvercell, gauze and conform Wound #6 (Left Toe Second) 1. Cleansed with: Clean wound with Normal Saline 2. Anesthetic Topical Lidocaine 4% cream to wound bed prior to debridement 4. Dressing Applied: Other dressing (specify in notes) 5. Secondary Dressing Applied ABD Pad Dry Gauze Notes silvercell, gauze and conform Electronic Signature(s) Signed: 09/05/2017 10:13:20 AM By: Lawanda Cousins Entered By: Lawanda Cousins on 09/05/2017 10:13:20 Shrout, [name] ([id number]) -------------------------------------------------------------------------------- [place] Details Patient Name: Dowdy, Jasan [name]. Date of Service: 09/05/2017 9:00  AM Medical Record Number: [id number] Patient Account Number: [id number] Date of Birth/Sex: Dec 16, 1950 (67 y.o. M) Treating RN: Ahmed Prima Primary Care Regina Coppolino: Lamonte Sakai Other Clinician: Referring Laveda Demedeiros: Lamonte Sakai Treating Chrisa Hassan/Extender: Cathie Olden in Treatment: 49 Active Inactive ` Abuse / Safety / Falls / Self Care Management Nursing Diagnoses: Potential for falls Goals: Patient will remain injury free related to falls Date Initiated: 09/21/2016 Target Resolution Date: 09/14/2017 Goal Status: Active Interventions: Assess fall risk on admission and as needed Notes: ` Nutrition Nursing Diagnoses: Potential for alteratiion in Nutrition/Potential for imbalanced nutrition Goals: Patient/caregiver agrees to and verbalizes understanding of need to use nutritional supplements and/or vitamins as prescribed Date Initiated: 09/21/2016 Target Resolution Date: 09/14/2017 Goal Status: Active Interventions: Assess patient nutrition upon admission and as needed per policy Notes: ` Orientation to the Wound Care Program Nursing Diagnoses: Knowledge deficit related to the wound healing center program Goals: Patient/caregiver will verbalize understanding of the [facility] Date Initiated: 09/21/2016 Target Resolution Date: 06/15/2017 Goal Status: Active Interventions: DARKAulden, Calise ([id number]) Provide education on orientation to the wound center Notes: ` Wound/Skin Impairment Nursing Diagnoses: Knowledge deficit related to smoking impact on wound healing Goals: Ulcer/skin breakdown will have a volume reduction of 30% by week 4 Date Initiated: 09/21/2016 Target Resolution Date: 08/17/2017 Goal Status: Active Ulcer/skin breakdown will have a volume reduction of 50% by week 8 Date Initiated: 09/21/2016 Target Resolution  Date: 08/17/2017 Goal Status: Active Ulcer/skin breakdown will have a volume reduction of 80% by week 12 Date Initiated:  09/21/2016 Target Resolution Date: 09/14/2017 Goal Status: Active Ulcer/skin breakdown will heal within 14 weeks Date Initiated: 09/21/2016 Target Resolution Date: 08/17/2017 Goal Status: Active Interventions: Assess patient/caregiver ability to obtain necessary supplies Assess patient/caregiver ability to perform ulcer/skin care regimen upon admission and as needed Assess ulceration(s) every visit Notes: Electronic Signature(s) Signed: 09/06/2017 5:38:20 PM By: Alric Quan Entered By: Alric Quan on 09/05/2017 09:20:18 Reznik, Eliud [name]. ([id number]) -------------------------------------------------------------------------------- Pain Assessment Details Patient Name: Gimpel, Cullin [name]. Date of Service: 09/05/2017 9:00 AM Medical Record Number: [id number] Patient Account Number: [id number] Date of Birth/Sex: 1950/11/19 (67 y.o. M) Treating RN: Montey Hora Primary Care Kamaron Deskins: Lamonte Sakai Other Clinician: Referring Dillion Stowers: Lamonte Sakai Treating Ashauna Bertholf/Extender: Cathie Olden in Treatment: 49 Active Problems Location of Pain Severity and Description of Pain Patient Has Paino No Site Locations Pain Management and Medication Current Pain Management: Electronic Signature(s) Signed: 09/05/2017 4:32:43 PM By: Montey Hora Entered By: Montey Hora on 09/05/2017 09:02:13 Haning, [name] ([id number]) -------------------------------------------------------------------------------- Patient/Caregiver Education Details Patient Name: Sprunger, Bookert [name]. Date of Service: 09/05/2017 9:00 AM Medical Record Number: [id number] Patient Account Number: [id number] Date of Birth/Gender: [birth year] (67 y.o. M) Treating RN: Roger Shelter Primary Care Physician: Lamonte Sakai Other Clinician: Referring Physician: Lamonte Sakai Treating Physician/Extender: Cathie Olden in Treatment: 56 Education Assessment Education Provided To: Patient Education Topics Provided Wound  Debridement: Handouts: Wound Debridement Methods: Explain/Verbal Responses: State content correctly Wound/Skin Impairment: Handouts: Caring for Your Ulcer Methods: Explain/Verbal Responses: State content correctly Electronic Signature(s) Signed: 09/05/2017 11:59:50 AM By: Roger Shelter Entered By: Roger Shelter on 09/05/2017 09:48:25 Kehl, Turhan [name]. ([id number]) -------------------------------------------------------------------------------- Wound Assessment Details Patient Name: Rood, Harlen [name]. Date of Service: 09/05/2017 9:00 AM Medical Record Number: [id number] Patient Account Number: [id number] Date of Birth/Sex: [birth year] (67 y.o. M) Treating RN: Montey Hora Primary Care Virgia Kelner: Lamonte Sakai Other Clinician: Referring Jarell Mcewen: Lamonte Sakai Treating Nyiah Pianka/Extender: Cathie Olden in Treatment: 49 Wound Status Wound Number: 1 Primary Diabetic Wound/Ulcer of the Lower Extremity Etiology: Wound Location: Right Toe Great Wound Open Wounding Event: Gradually Appeared Status: Date Acquired: 06/11/2016 Comorbid Anemia, Lymphedema, Congestive Heart Weeks Of Treatment: 49 History: Failure, Hypertension, Peripheral Venous Clustered Wound: No Disease, Type II Diabetes, Neuropathy Pending Amputation On Presentation Photos Photo Uploaded By: Montey Hora on 09/05/2017 10:44:46 Wound Measurements Length: (cm) 1.8 Width: (cm) 1.2 Depth: (cm) 0.1 Area: (cm) 1.696 Volume: (cm) 0.17 % Reduction in Area: 96.4% % Reduction in Volume: 96.4% Epithelialization: Medium (34-66%) Tunneling: No Undermining: No Wound Description Classification: Grade 2 Wound Margin: Indistinct, nonvisible Exudate Amount: Medium Exudate Type: Serosanguineous Exudate Color: red, brown Foul Odor After Cleansing: No Slough/Fibrino Yes Wound Bed Granulation Amount: Large (67-100%) Exposed Structure Granulation Quality: Red Fascia Exposed: No Necrotic Amount: Small (1-33%) Fat  Layer (Subcutaneous Tissue) Exposed: Yes Necrotic Quality: Adherent Slough Tendon Exposed: No Muscle Exposed: No Joint Exposed: No Bone Exposed: No Periwound Skin Texture Benner, Raeden [name]. ([id number]) Texture Color No Abnormalities Noted: No No Abnormalities Noted: No Callus: No Atrophie Blanche: No Crepitus: No Cyanosis: No Excoriation: No Ecchymosis: No Induration: No Erythema: No Rash: No Hemosiderin Staining: No Scarring: No Mottled: No Pallor: No Moisture Rubor: No No Abnormalities Noted: No Dry / Scaly: No Temperature / Pain Maceration: No Temperature: No Abnormality Wound Preparation Ulcer Cleansing: Rinsed/Irrigated with Saline Topical Anesthetic Applied: Other: lidocaine 4%, Treatment Notes Wound #1 (Right Toe Great) 1. Cleansed with: Clean wound  with Normal Saline 2. Anesthetic Topical Lidocaine 4% cream to wound bed prior to debridement 4. Dressing Applied: Other dressing (specify in notes) 5. Secondary Dressing Applied ABD Pad Dry Gauze Notes silvercell, gauze and conform Electronic Signature(s) Signed: 09/05/2017 4:32:43 PM By: Montey Hora Entered By: Montey Hora on 09/05/2017 09:15:10 Anne, [name] ([id number]) -------------------------------------------------------------------------------- Wound Assessment Details Patient Name: Sherr, Williard [name]. Date of Service: 09/05/2017 9:00 AM Medical Record Number: [id number] Patient Account Number: [id number] Date of Birth/Sex: 1951-03-10 (67 y.o. M) Treating RN: Montey Hora Primary Care Treg Diemer: Lamonte Sakai Other Clinician: Referring Drelyn Pistilli: Lamonte Sakai Treating Fatuma Dowers/Extender: Cathie Olden in Treatment: 49 Wound Status Wound Number: 13 Primary Diabetic Wound/Ulcer of the Lower Extremity Etiology: Wound Location: Left Toe Third Wound Open Wounding Event: Trauma Status: Date Acquired: 08/29/2017 Comorbid Anemia, Lymphedema, Congestive Heart Weeks Of Treatment: 1 History:  Failure, Hypertension, Peripheral Venous Clustered Wound: No Disease, Type II Diabetes, Neuropathy Photos Wound Measurements Length: (cm) 0.7 % Reduction in Width: (cm) 0.5 % Reduction in Depth: (cm) 0.1 Epithelializat Area: (cm) 0.275 Tunneling: Volume: (cm) 0.027 Undermining: Area: -3337.5% Volume: -2600% ion: Small (1-33%) No No Wound Description Classification: Grade 1 Foul Odor Aft Wound Margin: Flat and Intact Slough/Fibrin Exudate Amount: Medium Exudate Type: Serous Exudate Color: amber er Cleansing: No o No Wound Bed Granulation Amount: Large (67-100%) Exposed Structure Granulation Quality: Red Fascia Exposed: No Necrotic Amount: None Present (0%) Fat Layer (Subcutaneous Tissue) Exposed: No Tendon Exposed: No Muscle Exposed: No Joint Exposed: No Bone Exposed: No Periwound Skin Texture Texture Color Fahl, Beacher [name]. ([id number]) No Abnormalities Noted: No No Abnormalities Noted: No Callus: No Atrophie Blanche: No Crepitus: No Cyanosis: No Excoriation: No Ecchymosis: No Induration: No Erythema: No Rash: No Hemosiderin Staining: No Scarring: No Mottled: No Pallor: No Moisture Rubor: No No Abnormalities Noted: No Dry / Scaly: No Maceration: No Wound Preparation Ulcer Cleansing: Rinsed/Irrigated with Saline Topical Anesthetic Applied: Other: lidocaine 4%, Treatment Notes Wound #13 (Left Toe Third) 1. Cleansed with: Clean wound with Normal Saline 2. Anesthetic Topical Lidocaine 4% cream to wound bed prior to debridement 4. Dressing Applied: Other dressing (specify in notes) 5. Secondary Dressing Applied ABD Pad Dry Gauze Notes silvercell, gauze and conform Electronic Signature(s) Signed: 09/05/2017 4:32:43 PM By: Montey Hora Entered By: Montey Hora on 09/05/2017 10:46:15 Stepanian, [name] ([id number]) -------------------------------------------------------------------------------- Wound Assessment Details Patient Name: Eutsler,  Tyrease [name]. Date of Service: 09/05/2017 9:00 AM Medical Record Number: [id number] Patient Account Number: [id number] Date of Birth/Sex: 1950/08/15 (67 y.o. M) Treating RN: Montey Hora Primary Care Calysta Craigo: Lamonte Sakai Other Clinician: Referring Khary Schaben: Lamonte Sakai Treating Cortny Bambach/Extender: Cathie Olden in Treatment: 49 Wound Status Wound Number: 2 Primary Diabetic Wound/Ulcer of the Lower Extremity Etiology: Wound Location: Right Toe Second Wound Open Wounding Event: Gradually Appeared Status: Date Acquired: 06/11/2016 Comorbid Anemia, Lymphedema, Congestive Heart Weeks Of Treatment: 49 History: Failure, Hypertension, Peripheral Venous Clustered Wound: No Disease, Type II Diabetes, Neuropathy Pending Amputation On Presentation Photos Wound Measurements Length: (cm) 1.3 % Reduction in Width: (cm) 2 % Reduction in Depth: (cm) 0.1 Epithelializati Area: (cm) 2.042 Tunneling: Volume: (cm) 0.204 Undermining: Area: 33.5% Volume: 33.6% on: Large (67-100%) No No Wound Description Classification: Grade 2 Foul Odor Afte Wound Margin: Flat and Intact Slough/Fibrino Exudate Amount: Small Exudate Type: Serous Exudate Color: amber r Cleansing: No No Wound Bed Granulation Amount: Large (67-100%) Exposed Structure Granulation Quality: Red Fascia Exposed: No Necrotic Amount: None Present (0%) Fat Layer (Subcutaneous Tissue) Exposed: No Tendon Exposed: No Muscle Exposed:  No Joint Exposed: No Bone Exposed: No Periwound Skin Texture Aden, Ada [name]. ([id number]) Texture Color No Abnormalities Noted: No No Abnormalities Noted: No Callus: No Atrophie Blanche: No Crepitus: No Cyanosis: No Excoriation: No Ecchymosis: No Induration: No Erythema: No Rash: No Hemosiderin Staining: No Scarring: No Mottled: No Pallor: No Moisture Rubor: No No Abnormalities Noted: No Dry / Scaly: No Temperature / Pain Maceration: No Temperature: No Abnormality Wound  Preparation Ulcer Cleansing: Rinsed/Irrigated with Saline Topical Anesthetic Applied: Other: lidocaine 4%, Treatment Notes Wound #2 (Right Toe Second) 1. Cleansed with: Clean wound with Normal Saline 2. Anesthetic Topical Lidocaine 4% cream to wound bed prior to debridement 4. Dressing Applied: Other dressing (specify in notes) 5. Secondary Dressing Applied ABD Pad Dry Gauze Notes silvercell, gauze and conform Electronic Signature(s) Signed: 09/05/2017 4:32:43 PM By: Montey Hora Entered By: Montey Hora on 09/05/2017 10:46:53 Mcmenamin, [name] ([id number]) -------------------------------------------------------------------------------- Wound Assessment Details Patient Name: Nanez, Jourdin [name]. Date of Service: 09/05/2017 9:00 AM Medical Record Number: [id number] Patient Account Number: [id number] Date of Birth/Sex: 07-18-1950 (67 y.o. M) Treating RN: Montey Hora Primary Care Zafiro Routson: Lamonte Sakai Other Clinician: Referring Jaycee Mckellips: Lamonte Sakai Treating Davidjames Blansett/Extender: Cathie Olden in Treatment: 49 Wound Status Wound Number: 5 Primary Diabetic Wound/Ulcer of the Lower Extremity Etiology: Wound Location: Left Toe Great Wound Open Wounding Event: Gradually Appeared Status: Date Acquired: 06/11/2016 Comorbid Anemia, Lymphedema, Congestive Heart Weeks Of Treatment: 49 History: Failure, Hypertension, Peripheral Venous Clustered Wound: No Disease, Type II Diabetes, Neuropathy Pending Amputation On Presentation Photos Wound Measurements Length: (cm) 0.6 % Reduction in Width: (cm) 0.6 % Reduction in Depth: (cm) 0.1 Epithelializati Area: (cm) 0.283 Volume: (cm) 0.028 Area: 98% Volume: 98% on: None Wound Description Classification: Grade 2 Foul Odor Afte Wound Margin: Flat and Intact Slough/Fibrino Exudate Amount: Medium Exudate Type: Serous Exudate Color: amber r Cleansing: No Yes Wound Bed Granulation Amount: Large (67-100%) Exposed  Structure Granulation Quality: Red Fascia Exposed: No Necrotic Amount: None Present (0%) Fat Layer (Subcutaneous Tissue) Exposed: No Tendon Exposed: No Muscle Exposed: No Joint Exposed: No Bone Exposed: No Periwound Skin Texture Peckman, Majed [name]. ([id number]) Texture Color No Abnormalities Noted: No No Abnormalities Noted: No Callus: No Atrophie Blanche: No Crepitus: No Cyanosis: No Excoriation: No Ecchymosis: No Induration: No Erythema: No Rash: No Hemosiderin Staining: No Scarring: No Mottled: No Pallor: No Moisture Rubor: No No Abnormalities Noted: No Dry / Scaly: No Temperature / Pain Maceration: No Temperature: No Abnormality Wound Preparation Ulcer Cleansing: Rinsed/Irrigated with Saline Topical Anesthetic Applied: Other: lidocaine 4%, Treatment Notes Wound #5 (Left Toe Great) 1. Cleansed with: Clean wound with Normal Saline 2. Anesthetic Topical Lidocaine 4% cream to wound bed prior to debridement 4. Dressing Applied: Other dressing (specify in notes) 5. Secondary Dressing Applied ABD Pad Dry Gauze Notes silvercell, gauze and conform Electronic Signature(s) Signed: 09/05/2017 4:32:43 PM By: Montey Hora Entered By: Montey Hora on 09/05/2017 10:48:55 Herbel, Lajuan [name]. ([id number]) -------------------------------------------------------------------------------- Wound Assessment Details Patient Name: Brule, Grae [name]. Date of Service: 09/05/2017 9:00 AM Medical Record Number: [id number] Patient Account Number: [id number] Date of Birth/Sex: April 01, 1951 (67 y.o. M) Treating RN: Montey Hora Primary Care Ronnika Collett: Lamonte Sakai Other Clinician: Referring Kingson Lohmeyer: Lamonte Sakai Treating Keyondra Lagrand/Extender: Cathie Olden in Treatment: 49 Wound Status Wound Number: 6 Primary Diabetic Wound/Ulcer of the Lower Extremity Etiology: Wound Location: Left Toe Second Wound Open Wounding Event: Gradually Appeared Status: Date Acquired: 06/11/2016 Comorbid  Anemia, Lymphedema, Congestive Heart Weeks Of Treatment: 49 History: Failure, Hypertension, Peripheral Venous Clustered Wound: No  Disease, Type II Diabetes, Neuropathy Pending Amputation On Presentation Photos Wound Measurements Length: (cm) 0.8 % Reduction in Width: (cm) 0.8 % Reduction in Depth: (cm) 0.1 Epithelializati Area: (cm) 0.503 Tunneling: Volume: (cm) 0.05 Undermining: Area: 76.5% Volume: 76.6% on: Large (67-100%) No No Wound Description Classification: Grade 2 Foul Odor Afte Wound Margin: Flat and Intact Slough/Fibrino Exudate Amount: Medium Exudate Type: Serous Exudate Color: amber r Cleansing: No No Wound Bed Granulation Amount: Large (67-100%) Exposed Structure Granulation Quality: Red Fascia Exposed: No Necrotic Amount: None Present (0%) Fat Layer (Subcutaneous Tissue) Exposed: No Tendon Exposed: No Muscle Exposed: No Joint Exposed: No Bone Exposed: No Periwound Skin Texture Leser, Remo [name]. ([id number]) Texture Color No Abnormalities Noted: No No Abnormalities Noted: No Callus: No Atrophie Blanche: No Crepitus: No Cyanosis: No Excoriation: No Ecchymosis: No Induration: No Erythema: No Rash: No Hemosiderin Staining: No Scarring: No Mottled: No Pallor: No Moisture Rubor: No No Abnormalities Noted: No Dry / Scaly: No Temperature / Pain Maceration: No Temperature: No Abnormality Wound Preparation Ulcer Cleansing: Rinsed/Irrigated with Saline Topical Anesthetic Applied: Other: lidocaine 4%, Treatment Notes Wound #6 (Left Toe Second) 1. Cleansed with: Clean wound with Normal Saline 2. Anesthetic Topical Lidocaine 4% cream to wound bed prior to debridement 4. Dressing Applied: Other dressing (specify in notes) 5. Secondary Dressing Applied ABD Pad Dry Gauze Notes silvercell, gauze and conform Electronic Signature(s) Signed: 09/05/2017 4:32:43 PM By: Montey Hora Entered By: Montey Hora on 09/05/2017 10:49:39 Zamor,  [name] ([id number]) -------------------------------------------------------------------------------- [place] Details Patient Name: Wiesen, Ian [name]. Date of Service: 09/05/2017 9:00 AM Medical Record Number: [id number] Patient Account Number: [id number] Date of Birth/Sex: 1950-05-22 (67 y.o. M) Treating RN: Montey Hora Primary Care Tyquez Hollibaugh: Lamonte Sakai Other Clinician: Referring Adonia Porada: Lamonte Sakai Treating Estreya Clay/Extender: Cathie Olden in Treatment: 37 Vital Signs Time Taken: 09:00 Temperature (F): 98.0 Height (in): 69 Pulse (bpm): 65 Weight (lbs): 168 Respiratory Rate (breaths/min): 16 Body Mass Index (BMI): 24.8 Blood Pressure (mmHg): 162/64 Reference Range: 80 - 120 mg / dl Electronic Signature(s) Signed: 09/05/2017 4:32:43 PM By: Montey Hora Entered By: Montey Hora on 09/05/2017 09:07:10

## 2017-09-12 ENCOUNTER — Ambulatory Visit: Payer: Medicare HMO | Admitting: Nurse Practitioner

## 2017-09-17 ENCOUNTER — Other Ambulatory Visit: Payer: Self-pay | Admitting: Oncology

## 2017-09-18 ENCOUNTER — Other Ambulatory Visit: Payer: Self-pay | Admitting: *Deleted

## 2017-09-19 ENCOUNTER — Encounter: Payer: Self-pay | Admitting: Oncology

## 2017-09-19 ENCOUNTER — Inpatient Hospital Stay: Payer: Medicare HMO

## 2017-09-19 ENCOUNTER — Inpatient Hospital Stay: Payer: Medicare HMO | Attending: Oncology

## 2017-09-19 ENCOUNTER — Inpatient Hospital Stay (HOSPITAL_BASED_OUTPATIENT_CLINIC_OR_DEPARTMENT_OTHER): Payer: Medicare HMO | Admitting: Oncology

## 2017-09-19 ENCOUNTER — Encounter: Payer: Medicare HMO | Attending: Nurse Practitioner | Admitting: Nurse Practitioner

## 2017-09-19 ENCOUNTER — Other Ambulatory Visit: Payer: Self-pay

## 2017-09-19 VITALS — BP 146/73 | HR 58 | Temp 97.2°F | Resp 18 | Ht 69.0 in | Wt 183.4 lb

## 2017-09-19 DIAGNOSIS — Z85828 Personal history of other malignant neoplasm of skin: Secondary | ICD-10-CM | POA: Insufficient documentation

## 2017-09-19 DIAGNOSIS — D472 Monoclonal gammopathy: Secondary | ICD-10-CM | POA: Insufficient documentation

## 2017-09-19 DIAGNOSIS — M869 Osteomyelitis, unspecified: Secondary | ICD-10-CM | POA: Insufficient documentation

## 2017-09-19 DIAGNOSIS — D631 Anemia in chronic kidney disease: Secondary | ICD-10-CM | POA: Insufficient documentation

## 2017-09-19 DIAGNOSIS — L97512 Non-pressure chronic ulcer of other part of right foot with fat layer exposed: Secondary | ICD-10-CM | POA: Diagnosis not present

## 2017-09-19 DIAGNOSIS — N189 Chronic kidney disease, unspecified: Principal | ICD-10-CM

## 2017-09-19 DIAGNOSIS — Z923 Personal history of irradiation: Secondary | ICD-10-CM | POA: Diagnosis not present

## 2017-09-19 DIAGNOSIS — E1169 Type 2 diabetes mellitus with other specified complication: Secondary | ICD-10-CM | POA: Diagnosis not present

## 2017-09-19 DIAGNOSIS — I509 Heart failure, unspecified: Secondary | ICD-10-CM | POA: Diagnosis not present

## 2017-09-19 DIAGNOSIS — L97522 Non-pressure chronic ulcer of other part of left foot with fat layer exposed: Secondary | ICD-10-CM | POA: Insufficient documentation

## 2017-09-19 DIAGNOSIS — E1122 Type 2 diabetes mellitus with diabetic chronic kidney disease: Secondary | ICD-10-CM | POA: Insufficient documentation

## 2017-09-19 DIAGNOSIS — E11621 Type 2 diabetes mellitus with foot ulcer: Secondary | ICD-10-CM | POA: Diagnosis not present

## 2017-09-19 DIAGNOSIS — F17210 Nicotine dependence, cigarettes, uncomplicated: Secondary | ICD-10-CM | POA: Diagnosis not present

## 2017-09-19 DIAGNOSIS — I890 Lymphedema, not elsewhere classified: Secondary | ICD-10-CM | POA: Diagnosis not present

## 2017-09-19 DIAGNOSIS — L03031 Cellulitis of right toe: Secondary | ICD-10-CM | POA: Diagnosis not present

## 2017-09-19 DIAGNOSIS — I130 Hypertensive heart and chronic kidney disease with heart failure and stage 1 through stage 4 chronic kidney disease, or unspecified chronic kidney disease: Secondary | ICD-10-CM | POA: Insufficient documentation

## 2017-09-19 DIAGNOSIS — I87313 Chronic venous hypertension (idiopathic) with ulcer of bilateral lower extremity: Secondary | ICD-10-CM | POA: Insufficient documentation

## 2017-09-19 LAB — CBC WITH DIFFERENTIAL/PLATELET
BASOS ABS: 0 10*3/uL (ref 0–0.1)
BASOS PCT: 1 %
EOS ABS: 0.3 10*3/uL (ref 0–0.7)
Eosinophils Relative: 4 %
HCT: 25.1 % — ABNORMAL LOW (ref 40.0–52.0)
HEMOGLOBIN: 8.6 g/dL — AB (ref 13.0–18.0)
Lymphocytes Relative: 14 %
Lymphs Abs: 1 10*3/uL (ref 1.0–3.6)
MCH: 30.1 pg (ref 26.0–34.0)
MCHC: 34.3 g/dL (ref 32.0–36.0)
MCV: 87.8 fL (ref 80.0–100.0)
MONOS PCT: 10 %
Monocytes Absolute: 0.7 10*3/uL (ref 0.2–1.0)
NEUTROS ABS: 5.1 10*3/uL (ref 1.4–6.5)
NEUTROS PCT: 71 %
Platelets: 200 10*3/uL (ref 150–440)
RBC: 2.86 MIL/uL — ABNORMAL LOW (ref 4.40–5.90)
RDW: 13.9 % (ref 11.5–14.5)
WBC: 7.2 10*3/uL (ref 3.8–10.6)

## 2017-09-19 LAB — COMPREHENSIVE METABOLIC PANEL
ALK PHOS: 79 U/L (ref 38–126)
ALT: 31 U/L (ref 17–63)
ANION GAP: 5 (ref 5–15)
AST: 42 U/L — ABNORMAL HIGH (ref 15–41)
Albumin: 2.8 g/dL — ABNORMAL LOW (ref 3.5–5.0)
BUN: 44 mg/dL — ABNORMAL HIGH (ref 6–20)
CALCIUM: 8.2 mg/dL — AB (ref 8.9–10.3)
CO2: 22 mmol/L (ref 22–32)
Chloride: 106 mmol/L (ref 101–111)
Creatinine, Ser: 2.71 mg/dL — ABNORMAL HIGH (ref 0.61–1.24)
GFR calc non Af Amer: 23 mL/min — ABNORMAL LOW (ref >60)
GFR, EST AFRICAN AMERICAN: 27 mL/min — AB (ref >60)
GLUCOSE: 164 mg/dL — AB (ref 65–99)
Potassium: 4.6 mmol/L (ref 3.5–5.1)
SODIUM: 133 mmol/L — AB (ref 135–145)
Total Bilirubin: 0.4 mg/dL (ref 0.3–1.2)
Total Protein: 7.4 g/dL (ref 6.5–8.1)

## 2017-09-19 MED ORDER — EPOETIN ALFA-EPBX 40000 UNIT/ML IJ SOLN
40000.0000 [IU] | Freq: Once | INTRAMUSCULAR | Status: AC
  Administered 2017-09-19: 40000 [IU] via SUBCUTANEOUS
  Filled 2017-09-19: qty 1

## 2017-09-19 MED ORDER — NON FORMULARY: 40000.0000 [IU] | Status: DC

## 2017-09-19 NOTE — Progress Notes (Signed)
No new changes noted today 

## 2017-09-20 ENCOUNTER — Other Ambulatory Visit: Payer: Self-pay | Admitting: *Deleted

## 2017-09-20 DIAGNOSIS — D631 Anemia in chronic kidney disease: Secondary | ICD-10-CM

## 2017-09-20 DIAGNOSIS — D472 Monoclonal gammopathy: Secondary | ICD-10-CM

## 2017-09-20 DIAGNOSIS — N189 Chronic kidney disease, unspecified: Principal | ICD-10-CM

## 2017-09-20 NOTE — Progress Notes (Signed)
Hematology/Oncology Consult note [facility] [facility]  [phone] [phone]  Patient Care Team: [name], MD as PCP - General (Internal Medicine)   Name of the patient: [name]  [id number]  1951-03-11   Date of visit: 09/20/17  Diagnosis-  1.Anemia- multifactorial- iron deficiency, chronic disease and anemia of kidney disease  2. 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. 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 Baruch Gouty. Patient had CT abdomen in July 2016 which showed retroperitoneal, celiac and gastric adenopathy. MR pelvis in Oct 2017 showed b/l inguinal and external iliac adenopathy as well. Inguinal LN biopsy was negative for malignancy.   2. Biopsy of the skin lesion showed: DIAGNOSIS:  A. PERIANAL POLYP, LEFT; EXCISION:  - SKIN WITH POLYPOID VASCULAR PROLIFERATION WITH SUPERFICIAL NECROSIS,  SEE COMMENT.   B. PERIANAL MASS, RIGHT; INCISIONAL BIOPSY:  - INVASIVE CARCINOMA WITH BASALOID FEATURES.  - SEE COMMENT.   Comment #1:  The differential diagnosis for the perianal polyp includes pyogenic  granuloma and ulcerated hemangioma.   Comment #2:  Immunohistochemical stains were performed. The carcinoma is positive for  p16, BerEp4, and BCL-2. Stain controls worked appropriately.  Unfortunately the pattern of staining does not assist with further  classification of the carcinoma. Clinical correlation is required to  determine if the lesion involves perianal skin primarily (BCC) or is  arising in the anal canal (squamous cell carcinoma with basaloid  features).   This was followed by RT to the lesion.  3. He had repeat CT abdomen in feb 2018 which showed: IMPRESSION: Progressive hepatic cirrhosis since prior  study. No evidence of hepatic neoplasm.  New moderate ascites, diffuse mesenteric and body wall edema, and small bilateral pleural effusions. Mild diffuse small bowel wall thickening, consistent with hypoalbuminemia.  Findings consistent with portal venous hypertension, including mild splenomegaly and upper abdominal venous collaterals.  Stable mild abdominal lymphadenopathy. Mild increase in bilateral iliac and inguinal lymphadenopathy in the pelvis, which may be reactive in etiology, with metastatic disease considered less Likely.  4. Heunderwentegd and colonoscopy for his microcytic anemiawhich showed no overt bleeding lesions or malignancy  5. CBC on 09/18/2016 showed H&H of 6.5/19.8 and MCV of 86.7. Labs suggestive of iron deficiency anemia. B12 and folate was within normal limits. Multiple myeloma panel showed IGG monoclonal protein of 0.9 gm. Polyclonal gammopathyHaptoglobin and no copper was normal. Reticulocyte count was mildly elevated at 7.9. Patient received 2 doses of ferriheme on 620 and 10/03/2016. He also received 1 unit of blood transfusion  6. EGD showed a normal duodenum and normal esophagus. Found to have gastritis which was biopsied. Also found to have a large polypoid mass in the left piriform sinus and nonobstructing the airway. This was subsequently evaluated by ENT which did not find any evidence of mass. The colonoscopy showed appendixthat was appearing protruding and ordered this blood with stool. No mucus seen in the vicinity. He was evaluated by a [facility] and has been considered for a repeat colonoscopy versus endoscopy mucosal resection of the mucocele  7.Bonemarrowbiopsy showed hypercellular marrow for age. 9% plasma cells. Clonality of cells could not be evaluated because of lack of clot sections available for staining. Cytogenetics were normal. FISH studies were negative for  any chromosomal abnormalities as well.  8. Patient received  procrit for anemia of chronic kidney disease with improvement of his anemia to 10  Interval history- patient feels significantly better after quitting alcohol. His energy levels are better. He denies any complaints today  ECOG PS- 1 Pain scale- 0  Review of systems- Review of Systems  Constitutional: Negative for chills, fever, malaise/fatigue and weight loss.  HENT: Negative for congestion, ear discharge and nosebleeds.   Eyes: Negative for blurred vision.  Respiratory: Negative for cough, hemoptysis, sputum production, shortness of breath and wheezing.   Cardiovascular: Negative for chest pain, palpitations, orthopnea and claudication.  Gastrointestinal: Negative for abdominal pain, blood in stool, constipation, diarrhea, heartburn, melena, nausea and vomiting.  Genitourinary: Negative for dysuria, flank pain, frequency, hematuria and urgency.  Musculoskeletal: Negative for back pain, joint pain and myalgias.  Skin: Negative for rash.  Neurological: Negative for dizziness, tingling, focal weakness, seizures, weakness and headaches.  Endo/Heme/Allergies: Does not bruise/bleed easily.  Psychiatric/Behavioral: Negative for depression and suicidal ideas. The patient does not have insomnia.       No Known Allergies   Past Medical History:  Diagnosis Date  . Anemia   . CHF (congestive heart failure) ([place])   . Diabetes mellitus    Patient take Metformin.  Marland Kitchen GERD (gastroesophageal reflux disease)   . Hypertension   . Leg swelling   . Loose, teeth    PATIENT STATES "HAS 6 TEETH, SOME ARE LOOSE"  . Neuromuscular disorder ([place])    RIGHT HAND AND FINGERS NUMB  . Shortness of breath dyspnea   . 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 02/24/2016     Past Surgical History:  Procedure Laterality Date  . APPENDECTOMY    . COLONOSCOPY WITH PROPOFOL N/A 11/19/2014   Procedure: COLONOSCOPY WITH PROPOFOL;  Surgeon: Lucilla Lame, MD;  Location: [place];  Service: Endoscopy;   Laterality: N/A;  DIABETIC-ORAL MEDS  . COLONOSCOPY WITH PROPOFOL N/A 10/04/2016   Procedure: COLONOSCOPY WITH PROPOFOL;  Surgeon: Jonathon Bellows, MD;  Location[facility] ENDOSCOPY;  Service: Endoscopy;  Laterality: N/A;  . ESOPHAGOGASTRODUODENOSCOPY (EGD) WITH PROPOFOL N/A 10/04/2016   Procedure: ESOPHAGOGASTRODUODENOSCOPY (EGD) WITH PROPOFOL;  Surgeon: Jonathon Bellows, MD;  Location: [facility] ENDOSCOPY;  Service: Endoscopy;  Laterality: N/A;  . GIVENS CAPSULE STUDY N/A 11/13/2016   Procedure: GIVENS CAPSULE STUDY;  Surgeon: Jonathon Bellows, MD;  Location[facility] ENDOSCOPY;  Service: Gastroenterology;  Laterality: N/A;  . INGUINAL LYMPH NODE BIOPSY Right 01/26/2016   Procedure: INGUINAL LYMPH NODE BIOPSY;  Surgeon: Clayburn Pert, MD;  Location: [facility] ORS;  Service: General;  Laterality: Right;  . RECTAL BIOPSY N/A 11/02/2014   Procedure: BIOPSY RECTAL;  Surgeon: Marlyce Huge, MD;  Location: [facility] ORS;  Service: General;  Laterality: N/A;  . RECTAL EXAM UNDER ANESTHESIA N/A 11/02/2014   Procedure: RECTAL EXAM UNDER ANESTHESIA;  Surgeon: Marlyce Huge, MD;  Location: [facility] ORS;  Service: General;  Laterality: N/A;  . TONSILLECTOMY      Social History   Socioeconomic History  . Marital status: Divorced    Spouse name: Not on file  . Number of children: Not on file  . Years of education: Not on file  . Highest education level: Not on file  Occupational History  . Not on file  Social Needs  . Financial resource strain: Not on file  . Food insecurity:    Worry: Not on file    Inability: Not on file  . Transportation needs:    Medical: Not  on file    Non-medical: Not on file  Tobacco Use  . Smoking status: Current Every Day Smoker    Packs/day: 0.25    Years: 20.00    Pack years: 5.00    Types: Cigarettes  . Smokeless tobacco: Never Used  . Tobacco comment: pt claims no smoking x 3 d  Substance and Sexual Activity  . Alcohol use: No    Comment: per pt used to drink beer daily- quit  may/2018  . Drug use: Yes    Types: Marijuana    Comment: Last Use 2016 per patient  . Sexual activity: Never  Lifestyle  . Physical activity:    Days per week: Not on file    Minutes per session: Not on file  . Stress: Not on file  Relationships  . Social connections:    Talks on phone: Not on file    Gets together: Not on file    Attends religious service: Not on file    Active member of club or organization: Not on file    Attends meetings of clubs or organizations: Not on file    Relationship status: Not on file  . Intimate partner violence:    Fear of current or ex partner: Not on file    Emotionally abused: Not on file    Physically abused: Not on file    Forced sexual activity: Not on file  Other Topics Concern  . Not on file  Social History Narrative  . Not on file    Family History  Problem Relation Age of Onset  . Asthma Mother      Current Outpatient Medications:  .  amLODipine (NORVASC) 10 MG tablet, Take by mouth., Disp: , Rfl:  .  aspirin EC 81 MG tablet, Take 81 mg by mouth daily., Disp: , Rfl:  .  atorvastatin (LIPITOR) 40 MG tablet, Take 40 mg by mouth at bedtime., Disp: , Rfl:  .  chlorthalidone (HYGROTON) 25 MG tablet, Take by mouth., Disp: , Rfl:  .  ferrous sulfate 325 (65 FE) MG tablet, Take by mouth., Disp: , Rfl:  .  hydrALAZINE (APRESOLINE) 25 MG tablet, Take 25 mg 2 (two) times daily by mouth., Disp: , Rfl:  .  omeprazole (PRILOSEC) 40 MG capsule, Take by mouth., Disp: , Rfl:  .  vitamin B-12 (CYANOCOBALAMIN) 1000 MCG tablet, Take 1 tablet (1,000 mcg total) by mouth daily., Disp: 30 tablet, Rfl: 3 .  polyethylene glycol (MIRALAX / GLYCOLAX) packet, Take by mouth., Disp: , Rfl:   Current Facility-Administered Medications:  .  albumin human 25 % solution 50 g, 50 g, Intravenous, Once, Jonathon Bellows, MD .  albumin human 25 % solution 50 g, 50 g, Intravenous, Once, Jonathon Bellows, MD  Facility-Administered Medications Ordered in Other Visits:  .   Darbepoetin Alfa (ARANESP) injection 100 mcg, 100 mcg, Subcutaneous, Q30 days, Sindy Guadeloupe, MD, 100 mcg at 07/25/17 0924 .  Darbepoetin Alfa (ARANESP) injection 100 mcg, 100 mcg, Subcutaneous, Q30 days, Sindy Guadeloupe, MD, 100 mcg at 08/22/17 1015 .  epoetin alfa (EPOGEN,PROCRIT) injection 40,000 Units, 40,000 Units, Subcutaneous, Weekly, Sindy Guadeloupe, MD, 40,000 Units at 05/23/17 1145  Physical exam:  Vitals:   09/19/17 0944  BP: (!) 146/73  Pulse: (!) 58  Resp: 18  Temp: (!) 97.2 F (36.2 C)  TempSrc: Tympanic  Weight: 183 lb 6.4 oz (83.2 kg)  Height: 5' 9" (1.753 m)   Physical Exam  Constitutional: He is oriented to person,  place, and time. He appears well-developed and well-nourished.  HENT:  Head: Normocephalic and atraumatic.  Eyes: Pupils are equal, round, and reactive to light. EOM are normal.  Neck: Normal range of motion.  Cardiovascular: Normal rate, regular rhythm and normal heart sounds.  Pulmonary/Chest: Effort normal and breath sounds normal.  Abdominal: Soft. Bowel sounds are normal. He exhibits no distension. There is no tenderness.  Musculoskeletal: He exhibits no edema.  Neurological: He is alert and oriented to person, place, and time.  Skin: Skin is warm and dry.     CMP Latest Ref Rng & Units 09/19/2017  Glucose 65 - 99 mg/dL 164(H)  BUN 6 - 20 mg/dL 44(H)  Creatinine 0.61 - 1.24 mg/dL 2.71(H)  Sodium 135 - 145 mmol/L 133(L)  Potassium 3.5 - 5.1 mmol/L 4.6  Chloride 101 - 111 mmol/L 106  CO2 22 - 32 mmol/L 22  Calcium 8.9 - 10.3 mg/dL 8.2(L)  Total Protein 6.5 - 8.1 g/dL 7.4  Total Bilirubin 0.3 - 1.2 mg/dL 0.4  Alkaline Phos 38 - 126 U/L 79  AST 15 - 41 U/L 42(H)  ALT 17 - 63 U/L 31   CBC Latest Ref Rng & Units 09/19/2017  WBC 3.8 - 10.6 K/uL 7.2  Hemoglobin 13.0 - 18.0 g/dL 8.6(L)  Hematocrit 40.0 - 52.0 % 25.1(L)  Platelets 150 - 440 K/uL 200    No images are attached to the encounter.  US Venous Img Lower Bilateral  Result Date:  09/06/2017 CLINICAL DATA:  Bilateral lower extremity pain and edema. History of smoking. Evaluate for DVT. EXAM: BILATERAL LOWER EXTREMITY VENOUS DOPPLER ULTRASOUND TECHNIQUE: Gray-scale sonography with graded compression, as well as color Doppler and duplex ultrasound were performed to evaluate the lower extremity deep venous systems from the level of the common femoral vein and including the common femoral, femoral, profunda femoral, popliteal and calf veins including the posterior tibial, peroneal and gastrocnemius veins when visible. The superficial great saphenous vein was also interrogated. Spectral Doppler was utilized to evaluate flow at rest and with distal augmentation maneuvers in the common femoral, femoral and popliteal veins. COMPARISON:  None. FINDINGS: RIGHT LOWER EXTREMITY Common Femoral Vein: No evidence of thrombus. Normal compressibility, respiratory phasicity and response to augmentation. Saphenofemoral Junction: No evidence of thrombus. Normal compressibility and flow on color Doppler imaging. Profunda Femoral Vein: No evidence of thrombus. Normal compressibility and flow on color Doppler imaging. Femoral Vein: No evidence of thrombus. Normal compressibility, respiratory phasicity and response to augmentation. Popliteal Vein: No evidence of thrombus. Normal compressibility, respiratory phasicity and response to augmentation. Calf Veins: No evidence of thrombus. Normal compressibility and flow on color Doppler imaging. Superficial Great Saphenous Vein: No evidence of thrombus. Normal compressibility. Venous Reflux:  None. Other Findings: Note is made of a prominent though non pathologically enlarged right inguinal lymph node measuring 1.2 cm in greatest short axis diameter and maintaining a benign fatty hilum (image 38). LEFT LOWER EXTREMITY Common Femoral Vein: No evidence of thrombus. Normal compressibility, respiratory phasicity and response to augmentation. Saphenofemoral Junction: No  evidence of thrombus. Normal compressibility and flow on color Doppler imaging. Profunda Femoral Vein: No evidence of thrombus. Normal compressibility and flow on color Doppler imaging. Femoral Vein: No evidence of thrombus. Normal compressibility, respiratory phasicity and response to augmentation. Popliteal Vein: No evidence of thrombus. Normal compressibility, respiratory phasicity and response to augmentation. Calf Veins: No evidence of thrombus. Normal compressibility and flow on color Doppler imaging. Superficial Great Saphenous Vein: No evidence of thrombus. Normal compressibility.  Venous Reflux:  None. Other Findings: Note is made of a prominent though non pathologically enlarged left inguinal lymph node measuring 1.3 cm in greatest short axis diameter and maintaining a benign fatty hilum (image 73). IMPRESSION: No evidence of DVT within either lower extremity. Electronically Signed   By: Sandi Mariscal M.D.   On: 09/06/2017 14:38     Assessment and plan- Patient is a 67 y.o. male with following issues;  1. Anemia of chronic kidney disease- his hb is down to 8.6 today will restart retacrit at 40000 units q monthly   will check cbc ferritin and iron studies next month and he gets retacrit in 1 and 2 months  I will see him back in 3 months- cbc, cmp, ferritin and iron studies b12 and folate  Continue monthly retacrit until then. Hold of hb >10.  2. Ig G MGUS- will check myeloma panel and serum free light chains next month   Visit Diagnosis 1. Anemia in chronic kidney disease, unspecified CKD stage   2. MGUS (monoclonal gammopathy of unknown significance)      Dr. Randa Evens, MD, MPH [facility] at [facility] [phone] 09/20/2017 1:42 PM

## 2017-09-22 NOTE — Progress Notes (Addendum)
DRAYLON, MERCADEL ([id number]) Visit Report for 09/19/2017 Chief Complaint Document Details Patient Name: [name], [name] Date of Service: 09/19/2017 12:30 PM Medical Record Number: [id number] Patient Account Number: [id number] Date of Birth/Sex: 1950-07-08 (67 y.o. M) Treating RN: Ahmed Prima Primary Care Provider: Lamonte Sakai Other Clinician: Referring Provider: Lamonte Sakai Treating Provider/Extender: Cathie Olden in Treatment: 67 Information Obtained from: Patient Chief Complaint Patient presents for treatment of an open diabetic ulcer to both feet Electronic Signature(s) Signed: 09/19/2017 2:41:45 PM By: Lawanda Cousins Entered By: Lawanda Cousins on 09/19/2017 14:41:45 [name] ([id number]) -------------------------------------------------------------------------------- HPI Details Patient Name: [name], [name] Date of Service: 09/19/2017 12:30 PM Medical Record Number: [id number] Patient Account Number: [id number] Date of Birth/Sex: 06/22/1950 (67 y.o. M) Treating RN: Ahmed Prima Primary Care Provider: Lamonte Sakai Other Clinician: Referring Provider: Lamonte Sakai Treating Provider/Extender: Cathie Olden in Treatment: 65 History of Present Illness Location: bilateral feet ulceration on the toes Quality: Patient reports experiencing a dull pain to affected area(s). Severity: Patient states wound are getting better Duration: Patient has had the wound for > 3 months prior to seeking treatment at the wound center Timing: Pain in wound is constant (hurts all the time) Context: The wound would happen gradually Modifying Factors: Other treatment(s) tried include:treatment for lymphedema and is seen by the podiatrist Dr. Caryl Comes Associated Signs and Symptoms: Patient reports having increase swelling. HPI Description: 67 year old patient here to see as for bilateral feet ulceration to on his left first and second toe and 2 on his right first and second toe, which  she's had for about 4 months. 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 Donella Stade. The patient is also undergoing workup by medical oncology for a intra-abdominal lymphadenopathy. 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 He is also status post appendectomy, inguinal lymph node biopsy, rectal biopsy and rectal examination under anesthesia. he currently smokes cigarettes about half packet a day. In March of this year he was seen by Dr. Hortencia Pilar, for evaluation of bilateral varicose veins and besides wearing compression stockings he had recommended laser ablation of the right and left great saphenous veins to eleviate the symptoms and complications of severe superficial venous reflux disease. He also recommended lymphedema pumps for better control of his lymphedema. The patient recently has had on 08/23/2016, right greater saphenous vein ablation with the laser energy Earlier lower extremity venous reflux examination done on 05/08/2016 showed no DVT or SVT both lower legs but incompetence of bilateral great saphenous veins was present. A lower arterial study was also done and there was no significant right lower and left lower extremity problems based on a normal toe brachial index bilaterally and the ABI was 1.21 the left and 1.23 on the right. His post ablation venous duplex examination showed successful ablation of the right GS vein with thrombus formation 2 below the right saphenofemoral junction. The deep system was patent without evidence of thrombosis and this was done on 08/30/2016. the patient also has a squamous cell cancer of the skin of the buttock and is recently undergone radiation therapy for this prior to excisional  surgery. Addendum: regarding his x-rays done today and x-ray of the left foot -- IMPRESSION: No objective evidence of osteomyelitis. There are soft tissue changes which  may reflect cellulitis. X-ray of the right foot -- IMPRESSION:Findings compatible with cellulitis of the toes. No objective evidence of osteomyelitis is observed. 10/01/16 on evaluation today patient's wounds appeared to be doing some better. I did review the x-rays as well which showed no evidence of osteomyelitis although there was evidence on x-ray of cellulitis. He fortunately is not having any discomfort although he continues to have some swelling. He does not remember being on any antibiotics recently. 10/15/16 on evaluation today patient's wounds overall appear to be doing better although he does have a new location noted on the left foot. Fortunately he is not having significant pain. It almost has the appearance that something is rubbing on the end of his toes but he wears the open toe shoes and according to what he is telling me never wears anything that would rub on his foot. There is no evidence of infection and specifically no evidence of a fungal infection 10/22/16 On evaluation today patient's wounds appeared to be doing better compared to last week in regard to his bilateral [name], [name] ([id number]) lower extremities. Fortunately I happy with how things are progressing although he still has ulcers I feel like that he is improving and appropriate manner. 11/12/16 on evaluation today patient appears to be doing well in regard to his bilateral feet and the respective wounds. We have been using surrounding her dressings along with an antifungal cream which seems to be doing very well. He has no bilateral dysfunction noticed that the rituals are weight loss at this point. He also has no nausea or vomiting a note purulent discharge. He did see Vein and vascular today and he tells me that they told him he could have surgery  for his venous stasis but they did not feel like it was worth it in his words. Fortunately patient's wounds do appear to be getting sneakily better. 11/26/2016 -- he says he is going to have some surgery during this week at [facility] for possibly a colon resection. 12/31/2016 -- the patient has been noncompliant with his smoking and I'm not sure whether he is also started drinking again. He continues to be very nonchalant about his care 01/14/2017 -- the patient's HandP has been reviewed well and I understand he is being compliant with trying to give up smoking and his local dressing changes. He does not have any surgical options of 4 to him by his vascular surgeons.he was last seen in early August by Dr. Hortencia Pilar who recommended compression stockings,and possibly lymph pumps in 2-3 months after doing a review ultrasound. 01/28/2017 - the patient did not have any fresh complaints but on examination I noted a large lacerated wound on the plantar aspect of his right fourth toe which had a lot of necrotic debris and it probes down to bone. 02/07/2017 -- x-ray of the right foot -- IMPRESSION: Soft tissue swelling about the first through fourth toes consistent with cellulitis. New destructive change in the tuft of the distal phalanx of the great toe is consistent with osteomyelitis. 02/14/2017 -- the patient's MRI is pending this coming Monday and he still continues to smoke. We have again gone over off loading of his wounds in great detail and he says he's been compliant. 02/21/2017 -- MR of the right foot -- IMPRESSION: 1. Soft tissue ulcer at the tip of the first, second and third toe knows. Cortical irregularity and bone marrow edema in the first distal phalanx most concerning for osteomyelitis. Mild marrow edema  in the second and third distal phalanx without definite cortical destruction which may reflect early osteomyelitis versus reactive marrow edema. 2. Soft tissue edema surrounding  the first phalanx most consistent with cellulitis. the patient was also recently evaluated by his medical oncologist Dr. 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 She is treating him with weekly Procrit. She is also keeping intra-abdominal lymphadenopathy and right lower lobe lung nodule under observation. 04/04/2017 -- he was seen by Dr. Adrian Prows on 03/25/2017 -- after review he empirically put him on ciprofloxacin and doxycycline as they have good bone penetration and good bioavailability and it will cover the usual pathogens and diabetic foot osteomyelitis. He will check inflammatory markers and plan a 74-64 week old records. C-reactive protein was 0.3 and the ESR was 72 04/18/17 on evaluation today patient appears to be doing about the same in regard to his lower extremity wounds bilaterally. He has continued to use the antifungal cream which does seem to be beneficial. Nonetheless the ulcers do seem to in some areas be epithelial eyes over and in other areas are still open. He is having no significant discomfort. 04/25/17-he is here in follow-up evaluation for multiple ulcerations to multiple toes bilaterally. He states he did see Dr. Ola Spurr again last week and continues antibiotic therapy. He is voicing no complaints or concerns, will continue with current treatment plan will possibility of adding compression therapy next week after an additional week of treatment/lotions to BLE prescribed by Dr Ola Spurr 05/02/17 he is here in follow up for for multiple ulcers to multiple toes bilaterally. we will stop using antifungal cream and will continue with silvercel and follow up next week 05/09/17-he is here in follow-up for multiple ulcerations to multiple toes bilaterally. There is improvement in appearance. He has not completely stopped using antifungal cream, but admits he has not using it between the toes. He has an  appointment with Dr. Ola Spurr on 2/11, continues on doxycycline and Cipro. It has been 5 weeks of antibiotic therapy, we will order plain film xray to evaluate for osteomyelitis next week, prior to follow up with ID. Will continue with silvercel and follow up next week 05/16/17-he is here in follow-up evaluation for multiple ulcerations to multiple toes bilaterally and new wound to the right posterior heel. There is essentially no change in appearance, deteriorating measurements; he has a history of waxing and waning measurements. He admits that he continues to apply moisturizer/cream/ointment to his toes despite weekly reminders to only apply silvercel to his toes. He states that he thinks the surgical shoe contributed to the superficial ulcer to his posterior [name], [name] ([id number]) heel, he is unable to articulate if this was an area of dry cracked skin as he has a similar area to the left heel. He now is wearing open toed slippers. He has an appointment with Dr. Ola Spurr on 2/11. We have ordered x-rays for her bilateral feet; he was advised to obtain the x-rays today or tomorrow. He will follow-up next week 05/23/17-he is here in follow-up evaluation for multiple ulcerations to multiple toes bilaterally and the right posterior heel. There is improvement in maceration. He has been compliant and not applying any moisturizing agent to his toes. He has been using Lac-Hydrin for his lower extremities with improvement. He did not go to his appointment on Monday with Dr. Ola Spurr secondary to financial concerns. X-rays for her bilateral feet showed: LEFT FOOT with slight  erosion of the tuft of the distal phalanges of the left first and second toe suspicious for osteomyelitis, RIGHT FOOT with 1.erosion of the tufts of the distal phalanges of the right first second and possibly third toes consistent with osteomyelitis, 2 no definitive abnormality of the calcaneus is seen on the images obtained, 3.  Plantar calcaneal degenerative spur. We briefly discussed hyperbaric adjunctive therapy for treatment of chronic refractory osteomyelitis. I do not find an a1c in EMR, will contact PCP for record, or order if needed. He has been encouraged to contact Dr Ola Spurr office regarding the follow-up appointment, encouraged him to inquire about payment plan. We will continue with same treatment plan and follow-up next week. He states he is still taking antibiotics and has "a lot" left. He states he has been taking them as directed, 2 pills twice daily. According to Dr. Blane Ohara office notes he was originally started on 12/17 for 4 weeks and extended on 1/14 for an additional 4 weeks. He should be done with his antibiotic therapy, he was advised to bring his bottles and to his next appointment, we will contact pharmacy. 05/30/17-he is here in follow-up evaluation for multiple ulcerations to multiple toes bilaterally and the right posterior heel. He is accompanied by his brother-in-law. Wounds are stable. He has yet to make up with Dr. Ola Spurr. We contacted his PCP, with no record of recent A1c we will draw an A1c. His brother-in-law states that he was taken off all of his diabetic medication secondary to kidney function. 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 The antibiotics were counted and have approximately 2 weeks left, although they should be complete. The pill organizer reveals missing days. We discussed the need for consistent medications, to have optimal benefit of medication. He has a cousin that lives with him and he will ask her to check his organizer daily. He has been advised to follow up with Dr Ola Spurr, and will go by the office today. He has been advised to quit smoking. 06/06/17-he is here in follow up evaluation. He has had to make an appointment with Dr. Ola Spurr. He did have  blood work obtained, a1c 5. He continues to take antibiotic therapy. Significant improvement in bilateral lower extremity edema with compression therapy. Essentially no change in ulcerations to toes. He states he is "going to try something different" and "let me know next week" if it works; he would not provide any additional information and was encouraged to follow our orders. We will follow up next week 06/13/17-he is here in follow-up evaluation. He has an appointment with Dr. Ola Spurr tomorrow morning. He states he purchased an ointment from [facility] and applied to his toes for 3 days, he does not remember the name of the ointment. There is improvement to his wounds, minimal maceration. He continues to take antibiotic therapy, this should have been completed last month. His brother-in-law who regularly accompanies his appointments was asked to take the bottles to the appointment tomorrow with Dr. Ola Spurr so he is aware. We will continue with 3 layer compression, and order OPEN TOE compression 20-30mmHg; we will apply compression stockings next week. He continues to smoke, smoked "2 cigarettes" last week 06/20/17 on evaluation today patient did receive his compression stockings which he has with him today for both lower extremities. With that being said he tells me at this point in time that he is very happy to have these he  really is not a big fan of the compression wraps that we have been utilizing although they have been of great benefit for him. Nonetheless at this point he does want to switch to the compression stockings. In my opinion as long as he is continuing with compression I'm okay with the stockings or the wraps. 06/27/17-he is here in follow-up evaluation for multiple ulcerations to his bilateral toes. There is some improvement in appearance. He is compliant in wearing his compression stockings with significant improvement in lower extremity edema. He saw Dr Ola Spurr on 3/8,  per his notes they would redraw ESR and CRP; plan to continue antibiotic therapy if these remain elevated. I do not see an ESR or CRP level in Epic. The patient continues to take antibiotics. 07/11/17-He is here in follow-up evaluation for multiple ulcerations to multiple toes bilaterally. He presents with complete epithelialization to the right third toe; there has been no deterioration. He continues on antibiotic therapy. He will follow-up next week 07/18/17-He is here in follow-up evaluation for multiple ulcerations to multiple toes bilaterally. He continues to make improvement. He continues on antibiotic therapy. He states he has been using something additional to our orders, he does not elaborate but states he will bring it in next week. 07/25/17-He is here in follow up evaluation for multiple ulcerations to bilateral toes. He is stable. He has completed antibiotic therapy. He admits to "filing" his toes after showers each evening, this is what he was referencing last week; he does not filing for the wounds. We will switch to [facility] and monitor for any improvement, he will follow-up next week 08/01/17-He is here in follow-up evaluation. He admits to "picking" at his toes after cleansing yesterday, leading to new areas of tissue loss on the bilateral second toe. There is improvement noted to the bilateral great toe. We will dress toes today and hope that they maintain until Monday where he will come in for a nurse visit. He has been advised, multiple times with expressed verbalization, to change the dressings to silvercel if the dressings get wet prior to [facility] appointment. [name], [name] ([id number]) 08/08/17-He is here in follow-up evaluation for bilateral first and second toe ulcerations. There is significant improvement to all ulcerations since last visit. We will switch to [facility] to all wounds and he will continue with nurse visits on a Monday/Thursday schedule and follow-up  with me in 2 weeks. He continues to smoke, 1-3 cigarettes per day, and has been encouraged to not smoke until his next follow-up in 2 weeks. 08/15/17 on evaluation today patient's ulcers on his toes actually appear to be doing fairly well the right to ulcers may be a little bit more moist compared to the left I'm not really sure exactly why as the openings appear to be very small and I'm not seeing any evidence of anything significant as far as you lightest or otherwise. Nonetheless this may just be a small setback he's been doing very well with the [facility] Dressing 08/22/17-He is here in follow-up evaluation for ulcerations to his bilateral first and second toes, there is small/scant amount of drainage noted on today's dressing. He continues with Hydrofera Blue. He continues to smoke, 1 cigarette a day. Voices no complaint or concerns, compliant and compression therapy today. He'll follow-up next week 08/29/17-He is here in follow-up evaluation for ulcerations to his bilateral first and second toes, with a new wound to the left third toe. He states he cut himself while cutting  the toenail. He presents today with more maceration and increased measurements; waxing and waning measurements and moisture has been an ongoing issue. He continues to smoke a proximally 1 cigarette per day, but states he has not smoked since Sunday. We will initiate medihoney daily and evaluate next week. He presents today without compression stockings 09/05/17-He is here in follow-up evaluation for ulcerations to bilateral first and second and left third toe. He did not pick up the medihoney and therefore has not been changing his dressing. He admits to inconsistent where of compression stockings, admits to pain to his bilateral lower extremities with unilateral edema (right greater than left). The ulcerations to multiple toes are more macerated and larger in size than last week. He is wearing compression therapy today. He  admits to an overall feeling of weakness and fatigue and has been encouraged to contact his PCP for evaluation; he has a known history of anemia and intraabdominal lymphadenopathy. We will return to silvercel and he will follow up next week 09/19/17-He is here in follow-up evaluation for ulcerations to bilateral first and second toe.he was unable to make last week's appointment. The culture that was obtained on 5/30 grew clindamycin sensitive MRSA; he was initiated on 6/4 but did not start it until 6/7. There is significant improvement in all wounds, healing to left second and third toe. He also admits to having no alcohol or smoking for the last 6 days. We will continue with same treatment plan I will extend the clindamycin for an additional 10 days and he will follow-up next week. Electronic Signature(s) Signed: 09/19/2017 2:46:54 PM By: Lawanda Cousins Previous Signature: 09/19/2017 2:44:16 PM Version By: Lawanda Cousins Entered By: Lawanda Cousins on 09/19/2017 14:46:53 [name], [name] ([id number]) -------------------------------------------------------------------------------- Physician Orders Details Patient Name: Blazejewski, Donaven E. Date of Service: 09/19/2017 12:30 PM Medical Record Number: [id number] Patient Account Number: [id number] Date of Birth/Sex: 03-31-1951 (67 y.o. M) Treating RN: Ahmed Prima Primary Care Provider: Lamonte Sakai Other Clinician: Referring Provider: Lamonte Sakai Treating Provider/Extender: Cathie Olden in Treatment: 53 Verbal / Phone Orders: Yes Clinician: Carolyne Fiscal, Debi Read Back and Verified: Yes Diagnosis Coding Wound Cleansing Wound #1 Right Toe Great o Clean wound with Normal Saline. Wound #2 Right Toe Second o Clean wound with Normal Saline. Wound #5 Left Toe Great o Clean wound with Normal Saline. Anesthetic (add to Medication List) Wound #1 Right Toe Great o Topical Lidocaine 4% cream applied to wound bed prior to debridement (In Clinic  Only). Wound #2 Right Toe Second o Topical Lidocaine 4% cream applied to wound bed prior to debridement (In Clinic Only). Wound #5 Left Toe Great o Topical Lidocaine 4% cream applied to wound bed prior to debridement (In Clinic Only). Primary Wound Dressing Wound #1 Right Toe Great o Silver Alginate Wound #2 Right Toe Second o Silver Alginate Wound #5 Left Toe Great o Silver Alginate Secondary Dressing Wound #1 Right Toe Great o ABD pad o Dry Gauze o Conform/Kerlix Wound #2 Right Toe Second o ABD pad o Dry Gauze o Conform/Kerlix Wound #5 Left Toe Great o ABD pad o Dry Gauze Nordby, Yusuf E. ([id number]) o Conform/Kerlix Dressing Change Frequency Wound #1 Right Toe Great o Change dressing every day. Wound #2 Right Toe Second o Change dressing every day. Wound #5 Left Toe Great o Change dressing every day. Follow-up Appointments Wound #1 Right Toe Great o Return Appointment in 1 week. Wound #2 Right Toe Second o Return Appointment in 1 week. Wound #5 Left Toe  Great o Return Appointment in 1 week. Edema Control Wound #1 Right Toe Great o Patient to wear own compression stockings Wound #2 Right Toe Second o Patient to wear own compression stockings Wound #5 Left Toe Great o Patient to wear own compression stockings Additional Orders / Instructions o Stop Smoking o Increase protein intake. Patient Medications Allergies: No Known Drug Allergies Notifications Medication Indication Start End lidocaine DOSE 1 - topical 4 % cream - 1 cream topical clindamycin HCl 09/22/2017 DOSE oral 300 mg capsule - capsule oral; take one tablet three times daily for 10 days Electronic Signature(s) Signed: 09/19/2017 2:28:53 PM By: Lawanda Cousins Entered By: Lawanda Cousins on 09/19/2017 14:28:51 [name], [name] ([id number]) -------------------------------------------------------------------------------- Prescription 09/19/2017 Patient  Name: [name] Provider: Lawanda Cousins NP Date of Birth: 1950/08/09 NPI#: [id number] Sex: Jerilynn Mages DEA#: [id number] Phone #: [phone] License #: Patient Address: [facility] [place], [place] [address] [address], [place], [place] [address] [phone] Allergies No Known Drug Allergies Medication Medication: Route: Strength: Form: lidocaine 4 % topical cream topical 4% cream Class: TOPICAL LOCAL ANESTHETICS Dose: Frequency / Time: Indication: 1 1 cream topical Number of Refills: Number of Units: 0 Generic Substitution: Start Date: End Date: One Time Use: Substitution Permitted No Note to Pharmacy: Signature(s): Date(s): Electronic Signature(s) Signed: 09/19/2017 3:35:38 PM By: Lawanda Cousins Entered By: Lawanda Cousins on 09/19/2017 14:28:54 [name], [name] ([id number]) --------------------------------------------------------------------------------  Problem List Details Patient Name: Sakata, Stephen E. Date of Service: 09/19/2017 12:30 PM Medical Record Number: [id number] Patient Account Number: [id number] Date of Birth/Sex: 13-Jan-1951 (67 y.o. M) Treating RN: Ahmed Prima Primary Care Provider: Lamonte Sakai Other Clinician: Referring Provider: Lamonte Sakai Treating Provider/Extender: Cathie Olden in Treatment: 69 Active Problems ICD-10 Impacting Encounter Code Description Active Date Wound Healing Diagnosis E11.621 Type 2 diabetes mellitus with foot ulcer 09/21/2016 No Yes I87.313 Chronic venous hypertension (idiopathic) with ulcer of 09/21/2016 No Yes bilateral lower extremity I89.0 Lymphedema, not elsewhere classified 09/21/2016 No Yes L97.522 Non-pressure chronic ulcer of other part of left foot with fat 09/21/2016 No Yes layer exposed L97.512 Non-pressure chronic ulcer of other part of right foot with fat 09/21/2016 No Yes layer exposed F17.218 Nicotine  dependence, cigarettes, with other nicotine-induced 09/21/2016 No Yes disorders F10.19 Alcohol abuse with unspecified alcohol-induced disorder 09/21/2016 No Yes M86.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, right ankle and foot 02/21/2017 No Yes Inactive Problems Resolved Problems Electronic Signature(s) RYLAN, BERNARD ([id number]) Signed: 09/19/2017 2:41:19 PM By: Lawanda Cousins Entered By: Lawanda Cousins on 09/19/2017 14:41:18 Yeagle, [name] ([id number]) -------------------------------------------------------------------------------- Progress Note Details Patient Name: [name], [name] Date of Service: 09/19/2017 12:30 PM Medical Record Number: [id number] Patient Account Number: [id number] Date of Birth/Sex: 07/11/1950 (67 y.o. M) Treating RN: Ahmed Prima Primary Care Provider: Lamonte Sakai Other Clinician: Referring Provider: Lamonte Sakai Treating Provider/Extender: Cathie Olden in Treatment: 38 Subjective Chief Complaint Information obtained from Patient Patient presents for treatment of an open diabetic ulcer to both feet History of Present Illness (HPI) The following HPI elements were documented for the patient's wound: Location: bilateral feet ulceration on the toes Quality: Patient reports experiencing a dull pain to affected area(s). Severity: Patient states wound are getting better Duration: Patient has had the wound for > 3 months prior to seeking treatment at the wound center Timing: Pain in wound is constant (hurts all the time) Context: The wound would happen gradually Modifying Factors: Other treatment(s) tried include:treatment for lymphedema and is  seen by the podiatrist Dr. Caryl Comes Associated Signs and Symptoms: Patient reports having increase swelling. 67 year old patient here to see as for bilateral feet ulceration to on his left first and second toe and 2 on his right first and second toe, which she's had for about 4 months. 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 Donella Stade. The patient is also undergoing workup by medical oncology for a intra-abdominal lymphadenopathy. 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 He is also status post appendectomy, inguinal lymph node biopsy, rectal biopsy and rectal examination under anesthesia. he currently smokes cigarettes about half packet a day. In March of this year he was seen by Dr. Hortencia Pilar, for evaluation of bilateral varicose veins and besides wearing compression stockings he had recommended laser ablation of the right and left great saphenous veins to eleviate the symptoms and complications of severe superficial venous reflux disease. He also recommended lymphedema pumps for better control of his lymphedema. The patient recently has had on 08/23/2016, right greater saphenous vein ablation with the laser energy Earlier lower extremity venous reflux examination done on 05/08/2016 showed no DVT or SVT both lower legs but incompetence of bilateral great saphenous veins was present. A lower arterial study was also done and there was no significant right lower and left lower extremity problems based on a normal toe brachial index bilaterally and the ABI was 1.21 the left and 1.23 on the right. His post ablation venous duplex examination showed successful ablation of the right GS vein with thrombus formation 2 below the right saphenofemoral junction. The deep system was patent without evidence of thrombosis and this was done on 08/30/2016. 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 Addendum: regarding his x-rays done today and x-ray of the left foot --  IMPRESSION: No objective evidence of osteomyelitis. There are soft tissue changes which may reflect cellulitis. X-ray of the right foot -- IMPRESSION:Findings compatible with cellulitis of the toes. No objective evidence of osteomyelitis is observed. 10/01/16 on evaluation today patient's wounds appeared to be doing some better. I did review the x-rays as well which showed no evidence of osteomyelitis although there was evidence on x-ray of cellulitis. He fortunately is not having any discomfort Walle, Nayan E. ([id number]) although he continues to have some swelling. He does not remember being on any antibiotics recently. 10/15/16 on evaluation today patient's wounds overall appear to be doing better although he does have a new location noted on the left foot. Fortunately he is not having significant pain. It almost has the appearance that something is rubbing on the end of his toes but he wears the open toe shoes and according to what he is telling me never wears anything that would rub on his foot. There is no evidence of infection and specifically no evidence of a fungal infection 10/22/16 On evaluation today patient's wounds appeared to be doing better compared to last week in regard to his bilateral lower extremities. Fortunately I happy with how things are progressing although he still has ulcers I feel like that he is improving and appropriate manner. 11/12/16 on evaluation today patient appears to be doing well in regard to  his bilateral feet and the respective wounds. We have been using surrounding her dressings along with an antifungal cream which seems to be doing very well. He has no bilateral dysfunction noticed that the rituals are weight loss at this point. He also has no nausea or vomiting a note purulent discharge. He did see Vein and vascular today and he tells me that they told him he could have surgery for his venous stasis but they did not feel like it was worth it in his words.  Fortunately patient's wounds do appear to be getting sneakily better. 11/26/2016 -- he says he is going to have some surgery during this week at [facility] for possibly a colon resection. 12/31/2016 -- the patient has been noncompliant with his smoking and I'm not sure whether he is also started drinking again. He continues to be very nonchalant about his care 01/14/2017 -- the patient's HandP has been reviewed well and I understand he is being compliant with trying to give up smoking and his local dressing changes. He does not have any surgical options of 4 to him by his vascular surgeons.he was last seen in early August by Dr. Hortencia Pilar who recommended compression stockings,and possibly lymph pumps in 2-3 months after doing a review ultrasound. 01/28/2017 - the patient did not have any fresh complaints but on examination I noted a large lacerated wound on the plantar aspect of his right fourth toe which had a lot of necrotic debris and it probes down to bone. 02/07/2017 -- x-ray of the right foot -- IMPRESSION: Soft tissue swelling about the first through fourth toes consistent with cellulitis. New destructive change in the tuft of the distal phalanx of the great toe is consistent with osteomyelitis. 02/14/2017 -- the patient's MRI is pending this coming Monday and he still continues to smoke. We have again gone over off loading of his wounds in great detail and he says he's been compliant. 02/21/2017 -- MR of the right foot -- IMPRESSION: 1. Soft tissue ulcer at the tip of the first, second and third toe knows. Cortical irregularity and bone marrow edema in the first distal phalanx most concerning for osteomyelitis. Mild marrow edema in the second and third distal phalanx without definite cortical destruction which may reflect early osteomyelitis versus reactive marrow edema. 2. Soft tissue edema surrounding the first phalanx most consistent with cellulitis. the patient was also  recently evaluated by his medical oncologist Dr. 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 She is treating him with weekly Procrit. She is also keeping intra-abdominal lymphadenopathy and right lower lobe lung nodule under observation. 04/04/2017 -- he was seen by Dr. Adrian Prows on 03/25/2017 -- after review he empirically put him on ciprofloxacin and doxycycline as they have good bone penetration and good bioavailability and it will cover the usual pathogens and diabetic foot osteomyelitis. He will check inflammatory markers and plan a 4-29 week old records. C-reactive protein was 0.3 and the ESR was 72 04/18/17 on evaluation today patient appears to be doing about the same in regard to his lower extremity wounds bilaterally. He has continued to use the antifungal cream which does seem to be beneficial. Nonetheless the ulcers do seem to in some areas be epithelial eyes over and in other areas are still open. He is having no significant discomfort. 04/25/17-he is here in follow-up evaluation for multiple ulcerations to multiple toes bilaterally. He states he did see  Dr. Ola Spurr again last week and continues antibiotic therapy. He is voicing no complaints or concerns, will continue with current treatment plan will possibility of adding compression therapy next week after an additional week of treatment/lotions to BLE prescribed by Dr Ola Spurr 05/02/17 he is here in follow up for for multiple ulcers to multiple toes bilaterally. we will stop using antifungal cream and will continue with silvercel and follow up next week Culbreth, Anwar E. ([id number]) 05/09/17-he is here in follow-up for multiple ulcerations to multiple toes bilaterally. There is improvement in appearance. He has not completely stopped using antifungal cream, but admits he has not using it between the toes. He has an appointment with Dr. Ola Spurr on 2/11,  continues on doxycycline and Cipro. It has been 5 weeks of antibiotic therapy, we will order plain film xray to evaluate for osteomyelitis next week, prior to follow up with ID. Will continue with silvercel and follow up next week 05/16/17-he is here in follow-up evaluation for multiple ulcerations to multiple toes bilaterally and new wound to the right posterior heel. There is essentially no change in appearance, deteriorating measurements; he has a history of waxing and waning measurements. He admits that he continues to apply moisturizer/cream/ointment to his toes despite weekly reminders to only apply silvercel to his toes. He states that he thinks the surgical shoe contributed to the superficial ulcer to his posterior heel, he is unable to articulate if this was an area of dry cracked skin as he has a similar area to the left heel. He now is wearing open toed slippers. He has an appointment with Dr. Ola Spurr on 2/11. We have ordered x-rays for her bilateral feet; he was advised to obtain the x-rays today or tomorrow. He will follow-up next week 05/23/17-he is here in follow-up evaluation for multiple ulcerations to multiple toes bilaterally and the right posterior heel. There is improvement in maceration. He has been compliant and not applying any moisturizing agent to his toes. He has been using Lac-Hydrin for his lower extremities with improvement. He did not go to his appointment on Monday with Dr. Ola Spurr secondary to financial concerns. X-rays for her bilateral feet showed: LEFT FOOT with slight erosion of the tuft of the distal phalanges of the left first and second toe suspicious for osteomyelitis, RIGHT FOOT with 1.erosion of the tufts of the distal phalanges of the right first second and possibly third toes consistent with osteomyelitis, 2 no definitive abnormality of the calcaneus is seen on the images obtained, 3. Plantar calcaneal degenerative spur. We briefly discussed  hyperbaric adjunctive therapy for treatment of chronic refractory osteomyelitis. I do not find an a1c in EMR, will contact PCP for record, or order if needed. He has been encouraged to contact Dr Ola Spurr office regarding the follow-up appointment, encouraged him to inquire about payment plan. We will continue with same treatment plan and follow-up next week. He states he is still taking antibiotics and has "a lot" left. He states he has been taking them as directed, 2 pills twice daily. According to Dr. Blane Ohara office notes he was originally started on 12/17 for 4 weeks and extended on 1/14 for an additional 4 weeks. He should be done with his antibiotic therapy, he was advised to bring his bottles and to his next appointment, we will contact pharmacy. 05/30/17-he is here in follow-up evaluation for multiple ulcerations to multiple toes bilaterally and the right posterior heel. He is accompanied by his brother-in-law. Wounds are stable. He has yet to make  up with Dr. Ola Spurr. We contacted his PCP, with no record of recent A1c we will draw an A1c. His brother-in-law states that he was taken off all of his diabetic medication secondary to kidney function. 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 The antibiotics were counted and have approximately 2 weeks left, although they should be complete. The pill organizer reveals missing days. We discussed the need for consistent medications, to have optimal benefit of medication. He has a cousin that lives with him and he will ask her to check his organizer daily. He has been advised to follow up with Dr Ola Spurr, and will go by the office today. He has been advised to quit smoking. 06/06/17-he is here in follow up evaluation. He has had to make an appointment with Dr. Ola Spurr. He did have blood work obtained, a1c 5. He continues to take  antibiotic therapy. Significant improvement in bilateral lower extremity edema with compression therapy. Essentially no change in ulcerations to toes. He states he is "going to try something different" and "let me know next week" if it works; he would not provide any additional information and was encouraged to follow our orders. We will follow up next week 06/13/17-he is here in follow-up evaluation. He has an appointment with Dr. Ola Spurr tomorrow morning. He states he purchased an ointment from [facility] and applied to his toes for 3 days, he does not remember the name of the ointment. There is improvement to his wounds, minimal maceration. He continues to take antibiotic therapy, this should have been completed last month. His brother-in-law who regularly accompanies his appointments was asked to take the bottles to the appointment tomorrow with Dr. Ola Spurr so he is aware. We will continue with 3 layer compression, and order OPEN TOE compression 20-30mmHg; we will apply compression stockings next week. He continues to smoke, smoked "2 cigarettes" last week 06/20/17 on evaluation today patient did receive his compression stockings which he has with him today for both lower extremities. With that being said he tells me at this point in time that he is very happy to have these he really is not a big fan of the compression wraps that we have been utilizing although they have been of great benefit for him. Nonetheless at this point he does want to switch to the compression stockings. In my opinion as long as he is continuing with compression I'm okay with the stockings or the wraps. 06/27/17-he is here in follow-up evaluation for multiple ulcerations to his bilateral toes. There is some improvement in appearance. He is compliant in wearing his compression stockings with significant improvement in lower extremity edema. He saw Dr Ola Spurr on 3/8, per his notes they would redraw ESR and CRP; plan to  continue antibiotic therapy if these remain elevated. I do not see an ESR or CRP level in Epic. The patient continues to take antibiotics. 07/11/17-He is here in follow-up evaluation for multiple ulcerations to multiple toes bilaterally. He presents with complete epithelialization to the right third toe; there has been no deterioration. He continues on antibiotic therapy. He will follow-up next week 07/18/17-He is here in follow-up evaluation for multiple ulcerations to multiple toes bilaterally. He continues to make improvement. He continues on antibiotic therapy. He states he has been using something additional to our orders, he does [name], Marcelles E. ([id number]) not elaborate but states he will bring it in next week. 07/25/17-He is here in  follow up evaluation for multiple ulcerations to bilateral toes. He is stable. He has completed antibiotic therapy. He admits to "filing" his toes after showers each evening, this is what he was referencing last week; he does not filing for the wounds. We will switch to [facility] and monitor for any improvement, he will follow-up next week 08/01/17-He is here in follow-up evaluation. He admits to "picking" at his toes after cleansing yesterday, leading to new areas of tissue loss on the bilateral second toe. There is improvement noted to the bilateral great toe. We will dress toes today and hope that they maintain until Monday where he will come in for a nurse visit. He has been advised, multiple times with expressed verbalization, to change the dressings to silvercel if the dressings get wet prior to [name] appointment. 08/08/17-He is here in follow-up evaluation for bilateral first and second toe ulcerations. There is significant improvement to all ulcerations since last visit. We will switch to [facility] to all wounds and he will continue with nurse visits on a Monday/Thursday schedule and follow-up with me in 2 weeks. He continues to smoke, 1-3  cigarettes per day, and has been encouraged to not smoke until his next follow-up in 2 weeks. 08/15/17 on evaluation today patient's ulcers on his toes actually appear to be doing fairly well the right to ulcers may be a little bit more moist compared to the left I'm not really sure exactly why as the openings appear to be very small and I'm not seeing any evidence of anything significant as far as you lightest or otherwise. Nonetheless this may just be a small setback he's been doing very well with the [facility] Dressing 08/22/17-He is here in follow-up evaluation for ulcerations to his bilateral first and second toes, there is small/scant amount of drainage noted on today's dressing. He continues with Hydrofera Blue. He continues to smoke, 1 cigarette a day. Voices no complaint or concerns, compliant and compression therapy today. He'll follow-up next week 08/29/17-He is here in follow-up evaluation for ulcerations to his bilateral first and second toes, with a new wound to the left third toe. He states he cut himself while cutting the toenail. He presents today with more maceration and increased measurements; waxing and waning measurements and moisture has been an ongoing issue. He continues to smoke a proximally 1 cigarette per day, but states he has not smoked since Sunday. We will initiate medihoney daily and evaluate next week. He presents today without compression stockings 09/05/17-He is here in follow-up evaluation for ulcerations to bilateral first and second and left third toe. He did not pick up the medihoney and therefore has not been changing his dressing. He admits to inconsistent where of compression stockings, admits to pain to his bilateral lower extremities with unilateral edema (right greater than left). The ulcerations to multiple toes are more macerated and larger in size than last week. He is wearing compression therapy today. He admits to an overall feeling of weakness and  fatigue and has been encouraged to contact his PCP for evaluation; he has a known history of anemia and intraabdominal lymphadenopathy. We will return to silvercel and he will follow up next week 09/19/17-He is here in follow-up evaluation for ulcerations to bilateral first and second toe.he was unable to make last week's appointment. The culture that was obtained on 5/30 grew clindamycin sensitive MRSA; he was initiated on 6/4 but did not start it until 6/7. There is significant improvement in all wounds,  healing to left second and third toe. He also admits to having no alcohol or smoking for the last 6 days. We will continue with same treatment plan I will extend the clindamycin for an additional 10 days and he will follow-up next week. Objective Constitutional Vitals Time Taken: 12:47 PM, Height: 69 in, Weight: 168 lbs, BMI: 24.8, Temperature: 98 F, Pulse: 54 bpm, Respiratory Rate: 16 breaths/min, Blood Pressure: 151/72 mmHg. Integumentary (Hair, Skin) Wound #1 status is Open. Original cause of wound was Gradually Appeared. The wound is located on the Right Toe Great. The wound measures 0.6cm length x 1.2cm width x 0.1cm depth; 0.565cm^2 area and 0.057cm^3 volume. There is no tunneling or undermining noted. There is a medium amount of serosanguineous drainage noted. The wound margin is indistinct and nonvisible. There is small (1-33%) red granulation within the wound bed. There is a small (1-33%) amount of necrotic tissue within the wound bed including Adherent Slough. The periwound skin appearance exhibited: Callus, Scarring. The periwound skin appearance did not exhibit: Crepitus, Excoriation, Induration, Rash, Dry/Scaly, Maceration, Atrophie Blanche, Cyanosis, Ecchymosis, Hemosiderin Staining, Mottled, Pallor, Rubor, Erythema. Periwound temperature was noted Dase, Deandra E. ([id number]) as No Abnormality. Wound #13 status is Healed - Epithelialized. Original cause of wound was Trauma. The  wound is located on the Left Toe Third. The wound measures 0cm length x 0cm width x 0cm depth; 0cm^2 area and 0cm^3 volume. There is no tunneling or undermining noted. There is a none present amount of drainage noted. The wound margin is flat and intact. There is large (67-100%) red, pink granulation within the wound bed. There is no necrotic tissue within the wound bed. The periwound skin appearance exhibited: Callus. The periwound skin appearance did not exhibit: Crepitus, Excoriation, Induration, Rash, Scarring, Dry/Scaly, Maceration, Atrophie Blanche, Cyanosis, Ecchymosis, Hemosiderin Staining, Mottled, Pallor, Rubor, Erythema. Wound #2 status is Open. Original cause of wound was Gradually Appeared. The wound is located on the Right Toe Second. The wound measures 0.8cm length x 0.4cm width x 0.1cm depth; 0.251cm^2 area and 0.025cm^3 volume. There is no tunneling or undermining noted. There is a medium amount of serous drainage noted. The wound margin is flat and intact. There is large (67-100%) red granulation within the wound bed. There is no necrotic tissue within the wound bed. The periwound skin appearance exhibited: Callus. The periwound skin appearance did not exhibit: Crepitus, Excoriation, Induration, Rash, Scarring, Dry/Scaly, Maceration, Atrophie Blanche, Cyanosis, Ecchymosis, Hemosiderin Staining, Mottled, Pallor, Rubor, Erythema. Periwound temperature was noted as No Abnormality. Wound #5 status is Open. Original cause of wound was Gradually Appeared. The wound is located on the Left Toe Great. The wound measures 0.5cm length x 0.5cm width x 0.1cm depth; 0.196cm^2 area and 0.02cm^3 volume. There is no tunneling or undermining noted. There is a medium amount of serous drainage noted. The wound margin is flat and intact. There is large (67-100%) red granulation within the wound bed. There is no necrotic tissue within the wound bed. The periwound skin appearance did not exhibit:  Callus, Crepitus, Excoriation, Induration, Rash, Scarring, Dry/Scaly, Maceration, Atrophie Blanche, Cyanosis, Ecchymosis, Hemosiderin Staining, Mottled, Pallor, Rubor, Erythema. Periwound temperature was noted as No Abnormality. Wound #6 status is Healed - Epithelialized. Original cause of wound was Gradually Appeared. The wound is located on the Left Toe Second. The wound measures 0cm length x 0cm width x 0cm depth; 0cm^2 area and 0cm^3 volume. Assessment Active Problems ICD-10 Type 2 diabetes mellitus with foot ulcer Chronic venous hypertension (idiopathic) with ulcer of  bilateral lower extremity Lymphedema, not elsewhere classified Non-pressure chronic ulcer of other part of left foot with fat layer exposed Non-pressure chronic ulcer of other part of right foot with fat layer exposed Nicotine dependence, cigarettes, with other nicotine-induced disorders Alcohol abuse with unspecified alcohol-induced disorder Chronic multifocal osteomyelitis, right ankle and foot Plan Wound Cleansing: Wound #1 Right Toe Great: Clean wound with Normal Saline. Wound #2 Right Toe Second: Clean wound with Normal Saline. Loudon, Leul E. ([id number]) Wound #5 Left Toe Great: Clean wound with Normal Saline. Anesthetic (add to Medication List): Wound #1 Right Toe Great: Topical Lidocaine 4% cream applied to wound bed prior to debridement (In Clinic Only). Wound #2 Right Toe Second: Topical Lidocaine 4% cream applied to wound bed prior to debridement (In Clinic Only). Wound #5 Left Toe Great: Topical Lidocaine 4% cream applied to wound bed prior to debridement (In Clinic Only). Primary Wound Dressing: Wound #1 Right Toe Great: Silver Alginate Wound #2 Right Toe Second: Silver Alginate Wound #5 Left Toe Great: Silver Alginate Secondary Dressing: Wound #1 Right Toe Great: ABD pad Dry Gauze Conform/Kerlix Wound #2 Right Toe Second: ABD pad Dry Gauze Conform/Kerlix Wound #5 Left Toe Great: ABD  pad Dry Gauze Conform/Kerlix Dressing Change Frequency: Wound #1 Right Toe Great: Change dressing every day. Wound #2 Right Toe Second: Change dressing every day. Wound #5 Left Toe Great: Change dressing every day. Follow-up Appointments: Wound #1 Right Toe Great: Return Appointment in 1 week. Wound #2 Right Toe Second: Return Appointment in 1 week. Wound #5 Left Toe Great: Return Appointment in 1 week. Edema Control: Wound #1 Right Toe Great: Patient to wear own compression stockings Wound #2 Right Toe Second: Patient to wear own compression stockings Wound #5 Left Toe Great: Patient to wear own compression stockings Additional Orders / Instructions: Stop Smoking Increase protein intake. The following medication(s) was prescribed: lidocaine topical 4 % cream 1 1 cream topical was prescribed at facility clindamycin HCl oral 300 mg capsule capsule oral; take one tablet three times daily for 10 days starting 09/22/2017 DAINE, GUNTHER ([id number]) Electronic Signature(s) Signed: 09/22/2017 9:07:24 PM By: Lawanda Cousins Previous Signature: 09/19/2017 2:47:11 PM Version By: Lawanda Cousins Previous Signature: 09/19/2017 2:45:56 PM Version By: Lawanda Cousins Entered By: Lawanda Cousins on 09/22/2017 21:07:24 Ewer, [name] ([id number]) -------------------------------------------------------------------------------- SuperBill Details Patient Name: Kemppainen, Crit E. Date of Service: 09/19/2017 Medical Record Number: [id number] Patient Account Number: [id number] Date of Birth/Sex: July 31, 1950 (67 y.o. M) Treating RN: Ahmed Prima Primary Care Provider: Lamonte Sakai Other Clinician: Referring Provider: Lamonte Sakai Treating Provider/Extender: Cathie Olden in Treatment: 51 Diagnosis Coding ICD-10 Codes Code Description E11.621 Type 2 diabetes mellitus with foot ulcer I87.313 Chronic venous hypertension (idiopathic) with ulcer of bilateral lower extremity I89.0 Lymphedema, not  elsewhere classified L97.522 Non-pressure chronic ulcer of other part of left foot with fat layer exposed L97.512 Non-pressure chronic ulcer of other part of right foot with fat layer exposed F17.218 Nicotine dependence, cigarettes, 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 abuse 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, right ankle and foot Facility Procedures CPT4 Code: [id number] Description: 99214 - WOUND CARE VISIT-LEV 4 EST PT Modifier: Quantity: 1 Physician Procedures CPT4 Code Description: [id number] 09735 - WC PHYS LEVEL 3 - EST PT ICD-10 Diagnosis Description L97.522 Non-pressure chronic ulcer of other part of left foot with fat l L97.512 Non-pressure chronic ulcer of other part of right foot with fat E11.621 Type 2  diabetes mellitus with foot ulcer I87.313 Chronic venous hypertension (idiopathic)  with ulcer of bilateral Modifier: ayer exposed layer exposed lower extremi Quantity: 1 ty Electronic Signature(s) Signed: 09/19/2017 3:03:05 PM By: Alric Quan Signed: 09/19/2017 3:35:38 PM By: Lawanda Cousins Previous Signature: 09/19/2017 2:46:20 PM Version By: Lawanda Cousins Entered By: Alric Quan on 09/19/2017 15:03:05

## 2017-09-23 NOTE — Progress Notes (Signed)
JACARRI, GESNER ([id number]) Visit Report for 09/19/2017 Arrival Information Details Patient Name: [name], [name] Date of Service: 09/19/2017 12:30 PM Medical Record Number: [id number] Patient Account Number: [id number] Date of Birth/Sex: Aug 29, 1950 (67 y.o. M) Treating RN: Secundino Ginger Primary Care [name]: [name] Other Clinician: Referring Eduardo Wurth: [name] Treating Dayleen Beske/Extender: Cathie Olden in Treatment: 110 Visit Information History Since Last Visit Added or deleted any medications: No Patient Arrived: Ambulatory Any new allergies or adverse reactions: No Arrival Time: 12:44 Had a fall or experienced change in No Accompanied By: friend activities of daily living that may affect Transfer Assistance: None risk of falls: Patient Requires Transmission-Based No Signs or symptoms of abuse/neglect since last visito No Precautions: Hospitalized since last visit: No Patient Has Alerts: Yes Implantable device outside of the clinic excluding No Patient Alerts: DMII cellular tissue based products placed in the center since last visit: Has Dressing in Place as Prescribed: No Pain Present Now: No Electronic Signature(s) Signed: 09/19/2017 2:00:15 PM By: Secundino Ginger Entered By: Secundino Ginger on 09/19/2017 12:45:14 Thong, [name] ([id number]) -------------------------------------------------------------------------------- Clinic Level of Care Assessment Details Patient Name: Row, Haeden E. Date of Service: 09/19/2017 12:30 PM Medical Record Number: [id number] Patient Account Number: [id number] Date of Birth/Sex: 16-Jan-1951 (67 y.o. M) Treating RN: Ahmed Prima Primary Care Lillyen Schow: [name] Other Clinician: Referring Ayyan Sites: [name] Treating Jaivyn Gulla/Extender: Cathie Olden in Treatment: 86 Clinic Level of Care Assessment Items TOOL 4 Quantity Score X - Use when only an EandM is performed on FOLLOW-UP visit 1 0 ASSESSMENTS - Nursing Assessment /  Reassessment X - Reassessment of Co-morbidities (includes updates in patient status) 1 10 X- 1 5 Reassessment of Adherence to Treatment Plan ASSESSMENTS - Wound and Skin Assessment / Reassessment []  - Simple Wound Assessment / Reassessment - one wound 0 X- 3 5 Complex Wound Assessment / Reassessment - multiple wounds []  - 0 Dermatologic / Skin Assessment (not related to wound area) ASSESSMENTS - Focused Assessment []  - Circumferential Edema Measurements - multi extremities 0 []  - 0 Nutritional Assessment / Counseling / Intervention []  - 0 Lower Extremity Assessment (monofilament, tuning fork, pulses) []  - 0 Peripheral Arterial Disease Assessment (using hand held doppler) ASSESSMENTS - Ostomy and/or Continence Assessment and Care []  - Incontinence Assessment and Management 0 []  - 0 Ostomy Care Assessment and Management (repouching, etc.) PROCESS - Coordination of Care X - Simple Patient / Family Education for ongoing care 1 15 []  - 0 Complex (extensive) Patient / Family Education for ongoing care []  - 0 Staff obtains Programmer, systems, Records, Test Results / Process Orders []  - 0 Staff telephones HHA, Nursing Homes / Clarify orders / etc []  - 0 Routine Transfer to another Facility (non-emergent condition) []  - 0 Routine Hospital Admission (non-emergent condition) []  - 0 New Admissions / Biomedical engineer / Ordering NPWT, Apligraf, etc. []  - 0 Emergency Hospital Admission (emergent condition) X- 1 10 Simple Discharge Coordination Reller, Noble E. ([id number]) []  - 0 Complex (extensive) Discharge Coordination PROCESS - Special Needs []  - Pediatric / Minor Patient Management 0 []  - 0 Isolation Patient Management []  - 0 Hearing / Language / Visual special needs []  - 0 Assessment of Community assistance (transportation, D/C planning, etc.) []  - 0 Additional assistance / Altered mentation []  - 0 Support Surface(s) Assessment (bed, cushion, seat, etc.) INTERVENTIONS -  Wound Cleansing / Measurement []  - Simple Wound Cleansing - one wound 0 X- 3 5 Complex Wound Cleansing - multiple wounds X- 1 5 Wound Imaging (  photographs - any number of wounds) []  - 0 Wound Tracing (instead of photographs) []  - 0 Simple Wound Measurement - one wound X- 3 5 Complex Wound Measurement - multiple wounds INTERVENTIONS - Wound Dressings X - Small Wound Dressing one or multiple wounds 2 10 []  - 0 Medium Wound Dressing one or multiple wounds []  - 0 Large Wound Dressing one or multiple wounds X- 1 5 Application of Medications - topical []  - 0 Application of Medications - injection INTERVENTIONS - Miscellaneous []  - External ear exam 0 []  - 0 Specimen Collection (cultures, biopsies, blood, body fluids, etc.) []  - 0 Specimen(s) / Culture(s) sent or taken to Lab for analysis []  - 0 Patient Transfer (multiple staff / Civil Service fast streamer / Similar devices) []  - 0 Simple Staple / Suture removal (25 or less) []  - 0 Complex Staple / Suture removal (26 or more) []  - 0 Hypo / Hyperglycemic Management (close monitor of Blood Glucose) []  - 0 Ankle / Brachial Index (ABI) - do not check if billed separately X- 1 5 Vital Signs Nudd, Manley E. ([id number]) Has the patient been seen at the hospital within the last three years: Yes Total Score: 120 Level Of Care: New/Established - Level 4 Electronic Signature(s) Signed: 09/19/2017 3:04:13 PM By: Alric Quan Entered By: Alric Quan on 09/19/2017 15:02:52 Haff, [name] ([id number]) -------------------------------------------------------------------------------- Encounter Discharge Information Details Patient Name: Burcher, Ashley E. Date of Service: 09/19/2017 12:30 PM Medical Record Number: [id number] Patient Account Number: [id number] Date of Birth/Sex: 08/10/1950 (67 y.o. M) Treating RN: Roger Shelter Primary Care Shauntel Prest: [name] Other Clinician: Referring Lambert Jeanty: [name] Treating Deneene Tarver/Extender:  Cathie Olden in Treatment: 41 Encounter Discharge Information Items Discharge Condition: Stable Ambulatory Status: Ambulatory Discharge Destination: Home Transportation: Private Auto Schedule Follow-up Appointment: Yes Clinical Summary of Care: Electronic Signature(s) Signed: 09/19/2017 2:24:38 PM By: Roger Shelter Entered By: Roger Shelter on 09/19/2017 13:53:34 Bleicher, [name] ([id number]) -------------------------------------------------------------------------------- Lower Extremity Assessment Details Patient Name: Gayman, Baptiste E. Date of Service: 09/19/2017 12:30 PM Medical Record Number: [id number] Patient Account Number: [id number] Date of Birth/Sex: [birth year] (67 y.o. M) Treating RN: Secundino Ginger Primary Care Korion Cuevas: [name] Other Clinician: Referring Jannetta Massey: [name] Treating Peyson Delao/Extender: Cathie Olden in Treatment: 51 Edema Assessment Assessed: [Left: No] [Right: No] [Left: Edema] [Right: :] Calf Left: Right: Point of Measurement: 34 cm From Medial Instep 33.5 cm 36 cm Ankle Left: Right: Point of Measurement: 12 cm From Medial Instep 23.5 cm 24 cm Vascular Assessment Claudication: Claudication Assessment [Left:None] [Right:None] Pulses: Dorsalis Pedis Palpable: [Left:Yes] [Right:Yes] Posterior Tibial Extremity colors, hair growth, and conditions: Extremity Color: [Left:Hyperpigmented] [Right:Hyperpigmented] Hair Growth on Extremity: [Left:No] [Right:No] Capillary Refill: [Left:< 3 seconds] [Right:< 3 seconds] Toe Nail Assessment Left: Right: Thick: No No Discolored: No No Deformed: No No Improper Length and Hygiene: No No Notes most pts toenails are missing. Electronic Signature(s) Signed: 09/19/2017 2:00:15 PM By: Secundino Ginger Entered By: Secundino Ginger on 09/19/2017 13:11:21 Goelz, [name] ([id number]) -------------------------------------------------------------------------------- Multi Wound Chart Details Patient Name:  Stickels, Antowan E. Date of Service: 09/19/2017 12:30 PM Medical Record Number: [id number] Patient Account Number: [id number] Date of Birth/Sex: 09/23/1950 (67 y.o. M) Treating RN: Ahmed Prima Primary Care Kajah Santizo: [name] Other Clinician: Referring Malik Paar: [name] Treating Davidson Palmieri/Extender: Cathie Olden in Treatment: 51 Vital Signs Height(in): 69 Pulse(bpm): 54 Weight(lbs): 168 Blood Pressure(mmHg): 151/72 Body Mass Index(BMI): 25 Temperature(F): 98 Respiratory Rate 16 (breaths/min): Photos: Wound Location: Right Toe Great Left Toe Third Right Toe Second Wounding Event:  Gradually Appeared Trauma Gradually Appeared Primary Etiology: Diabetic Wound/Ulcer of 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 Lower Extremity Lower Extremity Comorbid History: Anemia, Lymphedema, Anemia, Lymphedema, Anemia, Lymphedema, Congestive Heart Failure, Congestive Heart Failure, Congestive Heart Failure, Hypertension, Peripheral Hypertension, Peripheral Hypertension, Peripheral Venous Disease, Type II Venous Disease, Type II Venous Disease, Type II Diabetes, Neuropathy Diabetes, Neuropathy Diabetes, Neuropathy Date Acquired: 06/11/2016 08/29/2017 06/11/2016 Weeks of Treatment: 51 3 51 Wound Status: Open Healed - Epithelialized Open Pending Amputation on Yes No Yes Presentation: Measurements L x W x D 0.6x1.2x0.1 0x0x0 0.8x0.4x0.1 (cm) Area (cm) : 0.565 0 0.251 Volume (cm) : 0.057 0 0.025 % Reduction in Area: 98.80% 100.00% 91.80% % Reduction in Volume: 98.80% 100.00% 91.90% Classification: Grade 2 Grade 1 Grade 2 Exudate Amount: Medium None Present Small Exudate Type: Serosanguineous N/A Serous Exudate Color: red, brown N/A amber Wound Margin: Indistinct, nonvisible Flat and Intact Flat and Intact Granulation Amount: Small (1-33%) Large (67-100%) Large (67-100%) Granulation Quality: Red Red, Pink Red Necrotic Amount: Small (1-33%) None  Present (0%) None Present (0%) Exposed Structures: Gusman, Wheeler E. ([id number]) Fascia: No Fascia: No Fascia: No Fat Layer (Subcutaneous Fat Layer (Subcutaneous Fat Layer (Subcutaneous Tissue) Exposed: No Tissue) Exposed: No Tissue) Exposed: No Tendon: No Tendon: No Tendon: No Muscle: No Muscle: No Muscle: No Joint: No Joint: No Joint: No Bone: No Bone: No Bone: No Epithelialization: Medium (34-66%) Small (1-33%) Large (67-100%) Periwound Skin Texture: Callus: Yes Callus: Yes Callus: Yes Scarring: Yes Excoriation: No Excoriation: No Excoriation: No Induration: No Induration: No Induration: No Crepitus: No Crepitus: No Crepitus: No Rash: No Rash: No Rash: No Scarring: No Scarring: No Periwound Skin Moisture: Maceration: No Maceration: No Maceration: No Dry/Scaly: No Dry/Scaly: No Dry/Scaly: No Periwound Skin Color: Atrophie Blanche: No Atrophie Blanche: No Atrophie Blanche: No Cyanosis: No Cyanosis: No Cyanosis: No Ecchymosis: No Ecchymosis: No Ecchymosis: No Erythema: No Erythema: No Erythema: No Hemosiderin Staining: No Hemosiderin Staining: No Hemosiderin Staining: No Mottled: No Mottled: No Mottled: No Pallor: No Pallor: No Pallor: No Rubor: No Rubor: No Rubor: No Temperature: No Abnormality N/A No Abnormality Tenderness on Palpation: No No No Wound Preparation: Ulcer Cleansing: Ulcer Cleansing: Ulcer Cleansing: Rinsed/Irrigated with Saline Rinsed/Irrigated with Saline Rinsed/Irrigated with Saline Topical Anesthetic Applied: Topical Anesthetic Applied: Topical Anesthetic Applied: Other: lidocaine 4% Other: lidocaine 4% Other: lidocaine 4% Wound Number: 5 6 N/A Photos: N/A Wound Location: Left Toe Great Left Toe Second N/A Wounding Event: Gradually Appeared Gradually Appeared N/A 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 ([id number]) Volume (cm) : 0.02 0 N/A % Reduction in Area: 98.60% 100.00% N/A % Reduction in Volume: 98.60% 100.00% N/A Classification: Grade 2 Grade 2 N/A Exudate Amount: Small N/A N/A Exudate Type: Serous N/A N/A Exudate Color: amber N/A N/A Wound Margin: Flat and Intact N/A N/A Granulation Amount: Large (67-100%) N/A N/A Granulation Quality: Red N/A N/A Necrotic Amount: None Present (0%) N/A N/A Exposed Structures: Fascia: No N/A N/A Fat Layer (Subcutaneous Tissue) Exposed: No Tendon: No Muscle: No Joint: No Bone: No Epithelialization: None N/A N/A Periwound Skin Texture: Excoriation:  No No Abnormalities Noted N/A Induration: No Callus: No Crepitus: No Rash: No Scarring: No Periwound Skin Moisture: Maceration: No No Abnormalities Noted N/A Dry/Scaly: No Periwound Skin Color: Atrophie Blanche: No No Abnormalities Noted N/A Cyanosis: No Ecchymosis: No Erythema: No Hemosiderin Staining: No Mottled: No Pallor: No Rubor: No Temperature: No Abnormality N/A N/A Tenderness on Palpation: No No N/A Wound Preparation: Ulcer Cleansing: N/A N/A Rinsed/Irrigated with Saline Topical Anesthetic Applied: Other: lidocaine 4% Treatment Notes Wound #1 (Right Toe Great) 1. Cleansed with: Clean wound with Normal Saline 2. Anesthetic Topical Lidocaine 4% cream to wound bed prior to debridement 4. Dressing Applied: Other dressing (specify in notes) 5. Secondary Dressing Applied ABD Pad Dry Gauze Notes silvercell. conform and tape Yeske, Shuan E. ([id number]) Wound #2 (Right Toe Second) 1. Cleansed with: Clean  wound with Normal Saline 2. Anesthetic Topical Lidocaine 4% cream to wound bed prior to debridement 4. Dressing Applied: Other dressing (specify in notes) 5. Secondary Dressing Applied ABD Pad Dry Gauze Notes silvercell. conform and tape Wound #5 (Left Toe Great) 1. Cleansed with: Clean wound with Normal Saline 2. Anesthetic Topical Lidocaine 4% cream to wound bed prior to debridement 4. Dressing Applied: Other dressing (specify in notes) 5. Secondary Dressing Applied ABD Pad Dry Gauze Notes silvercell. conform and tape Electronic Signature(s) Signed: 09/19/2017 2:41:34 PM By: Lawanda Cousins Entered By: Lawanda Cousins on 09/19/2017 14:41:34 Barefoot, [name] ([id number]) -------------------------------------------------------------------------------- [place] Details Patient Name: XIONG, HAIDAR E. Date of Service: 09/19/2017 12:30 PM Medical Record Number: [id number] Patient Account Number: [id number] Date of Birth/Sex: [birth year] (67 y.o. M) Treating RN: Ahmed Prima Primary Care Giordan Fordham: [name] Other Clinician: Referring Ahmira Boisselle: [name] Treating Zoe Creasman/Extender: Cathie Olden in Treatment: 42 Active Inactive ` Abuse / Safety / Falls / Self Care Management Nursing Diagnoses: Potential for falls Goals: Patient will remain injury free related to falls Date Initiated: 09/21/2016 Target Resolution Date: 09/14/2017 Goal Status: Active Interventions: Assess fall risk on admission and as needed Notes: ` Nutrition Nursing Diagnoses: Potential for alteratiion in Nutrition/Potential for imbalanced nutrition Goals: Patient/caregiver agrees to and verbalizes understanding of need to use nutritional supplements and/or vitamins as prescribed Date Initiated: 09/21/2016 Target Resolution Date: 09/14/2017 Goal Status: Active Interventions: Assess patient nutrition upon admission and as needed per policy Notes: ` Orientation to the Wound Care  Program Nursing Diagnoses: Knowledge deficit related to the wound healing center program Goals: Patient/caregiver will verbalize understanding of the [place] Program Date Initiated: 09/21/2016 Target Resolution Date: 06/15/2017 Goal Status: Active Interventions: DARKLayman, Gully ([id number]) Provide education on orientation to the wound center Notes: ` Wound/Skin Impairment Nursing Diagnoses: Knowledge deficit related to smoking impact on wound healing Goals: Ulcer/skin breakdown will have a volume reduction of 30% by week 4 Date Initiated: 09/21/2016 Target Resolution Date: 08/17/2017 Goal Status: Active Ulcer/skin breakdown will have a volume reduction of 50% by week 8 Date Initiated: 09/21/2016 Target Resolution Date: 08/17/2017 Goal Status: Active Ulcer/skin breakdown will have a volume reduction of 80% by week 12 Date Initiated: 09/21/2016 Target Resolution Date: 09/14/2017 Goal Status: Active Ulcer/skin breakdown will heal within 14 weeks Date Initiated: 09/21/2016 Target Resolution Date: 08/17/2017 Goal Status: Active Interventions: Assess patient/caregiver ability to obtain necessary supplies Assess patient/caregiver ability to perform ulcer/skin care regimen upon admission and as needed Assess ulceration(s) every visit Notes: Electronic Signature(s) Signed: 09/19/2017 3:04:13 PM By: Alric Quan Entered By: Alric Quan on 09/19/2017 13:36:06 Torti, [name] ([id number]) -------------------------------------------------------------------------------- Pain Assessment Details Patient Name: Freimuth, Maurio E.  Date of Service: 09/19/2017 12:30 PM Medical Record Number: [id number] Patient Account Number: [id number] Date of Birth/Sex: 05-11-1950 (67 y.o. M) Treating RN: Secundino Ginger Primary Care Ania Levay: [name] Other Clinician: Referring Zayvian Mcmurtry: [name] Treating Aly Seidenberg/Extender: Cathie Olden in Treatment: 15 Active Problems Location of Pain  Severity and Description of Pain Patient Has Paino No Site Locations Pain Management and Medication Current Pain Management: Goals for Pain Management Topical or injectable lidocaine is offered to patient for acute pain when surgical debridement is performed. If needed, Patient is instructed to use over the counter pain medication for the following 24-48 hours after debridement. Wound care MDs do not prescribed pain medications. Patient has chronic pain or uncontrolled pain. Patient has been instructed to make an appointment with their Primary Care Physician for pain management. Electronic Signature(s) Signed: 09/19/2017 2:00:15 PM By: Secundino Ginger Entered By: Secundino Ginger on 09/19/2017 12:46:39 Burgo, [name] ([id number]) -------------------------------------------------------------------------------- Patient/Caregiver Education Details Patient Name: Barris, Yaw E. Date of Service: 09/19/2017 12:30 PM Medical Record Number: [id number] Patient Account Number: [id number] Date of Birth/Gender: [birth year] (67 y.o. M) Treating RN: Roger Shelter Primary Care Physician: [name] Other Clinician: Referring Physician: Lamonte [name] Treating Physician/Extender: Cathie Olden in Treatment: 71 Education Assessment Education Provided To: Patient Education Topics Provided Wound Debridement: Handouts: Wound Debridement Methods: Explain/Verbal Responses: State content correctly Wound/Skin Impairment: Handouts: Caring for Your Ulcer Methods: Explain/Verbal Responses: State content correctly Electronic Signature(s) Signed: 09/19/2017 2:24:38 PM By: Roger Shelter Entered By: Roger Shelter on 09/19/2017 13:53:49 Goodroe, [name] ([id number]) -------------------------------------------------------------------------------- Wound Assessment Details Patient Name: Raby, Madsen E. Date of Service: 09/19/2017 12:30 PM Medical Record Number: [id number] Patient Account Number: [id number] Date  of Birth/Sex: 1950-04-11 (67 y.o. M) Treating RN: Secundino Ginger Primary Care Veleria Barnhardt: [name] Other Clinician: Referring Deshawnda Acrey: [name] Treating Thinh Cuccaro/Extender: Cathie Olden in Treatment: 51 Wound Status Wound Number: 1 Primary Diabetic Wound/Ulcer of the Lower Extremity Etiology: Wound Location: Right Toe Great Wound Open Wounding Event: Gradually Appeared Status: Date Acquired: 06/11/2016 Comorbid Anemia, Lymphedema, Congestive Heart Weeks Of Treatment: 51 History: Failure, Hypertension, Peripheral Venous Clustered Wound: No Disease, Type II Diabetes, Neuropathy Pending Amputation On Presentation Photos Wound Measurements Length: (cm) 0.6 Width: (cm) 1.2 Depth: (cm) 0.1 Area: (cm) 0.565 Volume: (cm) 0.057 % Reduction in Area: 98.8% % Reduction in Volume: 98.8% Epithelialization: Medium (34-66%) Tunneling: No Undermining: No Wound Description Classification: Grade 2 Foul Odor A Wound Margin: Indistinct, nonvisible Slough/Fibr Exudate Amount: Medium Exudate Type: Serosanguineous Exudate Color: red, brown fter Cleansing: No ino Yes Wound Bed Granulation Amount: Small (1-33%) Exposed Structure Granulation Quality: Red Fascia Exposed: No Necrotic Amount: Small (1-33%) Fat Layer (Subcutaneous Tissue) Exposed: No Necrotic Quality: Adherent Slough Tendon Exposed: No Muscle Exposed: No Joint Exposed: No Bone Exposed: No Periwound Skin Texture Meinhardt, Otoniel E. ([id number]) Texture Color No Abnormalities Noted: No No Abnormalities Noted: No Callus: Yes Atrophie Blanche: No Crepitus: No Cyanosis: No Excoriation: No Ecchymosis: No Induration: No Erythema: No Rash: No Hemosiderin Staining: No Scarring: Yes Mottled: No Pallor: No Moisture Rubor: No No Abnormalities Noted: No Dry / Scaly: No Temperature / Pain Maceration: No Temperature: No Abnormality Wound Preparation Ulcer Cleansing: Rinsed/Irrigated with Saline Topical Anesthetic  Applied: Other: lidocaine 4%, Treatment Notes Wound #1 (Right Toe Great) 1. Cleansed with: Clean wound with Normal Saline 2. Anesthetic Topical Lidocaine 4% cream to wound bed prior to debridement 4. Dressing Applied: Other dressing (specify in notes) 5. Secondary Dressing Applied ABD Pad Dry Gauze Notes silvercell. conform and  tape Electronic Signature(s) Signed: 09/20/2017 8:10:32 AM By: Alric Quan Signed: 09/20/2017 3:41:29 PM By: Secundino Ginger Previous Signature: 09/20/2017 8:09:59 AM Version By: Alric Quan Previous Signature: 09/19/2017 2:00:15 PM Version By: Secundino Ginger Entered By: Alric Quan on 09/20/2017 08:10:32 Nissen, [name] ([id number]) -------------------------------------------------------------------------------- Wound Assessment Details Patient Name: Sorg, Antwaun E. Date of Service: 09/19/2017 12:30 PM Medical Record Number: [id number] Patient Account Number: [id number] Date of Birth/Sex: [birth year] (67 y.o. M) Treating RN: Secundino Ginger Primary Care Aleshia Cartelli: [name] Other Clinician: Referring Manette Doto: [name] Treating Eino Whitner/Extender: Cathie Olden in Treatment: 51 Wound Status Wound Number: 13 Primary Diabetic Wound/Ulcer of the Lower Extremity Etiology: Wound Location: Left Toe Third Wound Healed - Epithelialized Wounding Event: Trauma Status: Date Acquired: 08/29/2017 Comorbid Anemia, Lymphedema, Congestive Heart Weeks Of Treatment: 3 History: Failure, Hypertension, Peripheral Venous Clustered Wound: No Disease, Type II Diabetes, Neuropathy Photos Photo Uploaded By: Secundino Ginger on 09/19/2017 13:17:27 Wound Measurements Length: (cm) 0 % Re Width: (cm) 0 % Re Depth: (cm) 0 Epit Area: (cm) 0 Tun Volume: (cm) 0 Und duction in Area: 100% duction in Volume: 100% helialization: Small (1-33%) neling: No ermining: No Wound Description Classification: Grade 1 Wound Margin: Flat and Intact Exudate Amount: None Present Foul  Odor After Cleansing: No Slough/Fibrino No Wound Bed Granulation Amount: Large (67-100%) Exposed Structure Granulation Quality: Red, Pink Fascia Exposed: No Necrotic Amount: None Present (0%) Fat Layer (Subcutaneous Tissue) Exposed: No Tendon Exposed: No Muscle Exposed: No Joint Exposed: No Bone Exposed: No Periwound Skin Texture Texture Color No Abnormalities Noted: No No Abnormalities Noted: No Clowers, Miken E. ([id number]) Callus: Yes Atrophie Blanche: No Crepitus: No Cyanosis: No Excoriation: No Ecchymosis: No Induration: No Erythema: No Rash: No Hemosiderin Staining: No Scarring: No Mottled: No Pallor: No Moisture Rubor: No No Abnormalities Noted: No Dry / Scaly: No Maceration: No Wound Preparation Ulcer Cleansing: Rinsed/Irrigated with Saline Topical Anesthetic Applied: Other: lidocaine 4%, Electronic Signature(s) Signed: 09/19/2017 2:00:15 PM By: Secundino Ginger Entered By: Secundino Ginger on 09/19/2017 13:02:06 Deskins, Dayan E. ([id number]) -------------------------------------------------------------------------------- Wound Assessment Details Patient Name: Mcclory, Jeromie E. Date of Service: 09/19/2017 12:30 PM Medical Record Number: [id number] Patient Account Number: [id number] Date of Birth/Sex: 02/23/1951 (67 y.o. M) Treating RN: Secundino Ginger Primary Care Myah Guynes: [name] Other Clinician: Referring Emery Dupuy: [name] Treating Reise Hietala/Extender: Cathie Olden in Treatment: 51 Wound Status Wound Number: 2 Primary Diabetic Wound/Ulcer of the Lower Extremity Etiology: Wound Location: Right Toe Second Wound Open Wounding Event: Gradually Appeared Status: Date Acquired: 06/11/2016 Comorbid Anemia, Lymphedema, Congestive Heart Weeks Of Treatment: 51 History: Failure, Hypertension, Peripheral Venous Clustered Wound: No Disease, Type II Diabetes, Neuropathy Pending Amputation On Presentation Photos Wound Measurements Length: (cm) 0.8 % Reduction  in Width: (cm) 0.4 % Reduction in Depth: (cm) 0.1 Epithelializat Area: (cm) 0.251 Tunneling: Volume: (cm) 0.025 Undermining: Area: 91.8% Volume: 91.9% ion: Large (67-100%) No No Wound Description Classification: Grade 2 Foul Odor Afte Wound Margin: Flat and Intact Slough/Fibrino Exudate Amount: Medium Exudate Type: Serous Exudate Color: amber r Cleansing: No No Wound Bed Granulation Amount: Large (67-100%) Exposed Structure Granulation Quality: Red Fascia Exposed: No Necrotic Amount: None Present (0%) Fat Layer (Subcutaneous Tissue) Exposed: No Tendon Exposed: No Muscle Exposed: No Joint Exposed: No Bone Exposed: No Periwound Skin Texture Sager, Eliyah E. ([id number]) Texture Color No Abnormalities Noted: No No Abnormalities Noted: No Callus: Yes Atrophie Blanche: No Crepitus: No Cyanosis: No Excoriation: No Ecchymosis: No Induration: No Erythema: No Rash: No Hemosiderin Staining: No Scarring: No Mottled: No  Pallor: No Moisture Rubor: No No Abnormalities Noted: No Dry / Scaly: No Temperature / Pain Maceration: No Temperature: No Abnormality Wound Preparation Ulcer Cleansing: Rinsed/Irrigated with Saline Topical Anesthetic Applied: Other: lidocaine 4%, Treatment Notes Wound #2 (Right Toe Second) 1. Cleansed with: Clean wound with Normal Saline 2. Anesthetic Topical Lidocaine 4% cream to wound bed prior to debridement 4. Dressing Applied: Other dressing (specify in notes) 5. Secondary Dressing Applied ABD Pad Dry Gauze Notes silvercell. conform and tape Electronic Signature(s) Signed: 09/20/2017 8:10:19 AM By: Alric Quan Signed: 09/20/2017 3:41:29 PM By: Secundino Ginger Previous Signature: 09/19/2017 2:00:15 PM Version By: Secundino Ginger Entered By: Alric Quan on 09/20/2017 08:10:19 Weyand, [name] ([id number]) -------------------------------------------------------------------------------- Wound Assessment Details Patient Name: Zabawa, Kashawn  E. Date of Service: 09/19/2017 12:30 PM Medical Record Number: [id number] Patient Account Number: [id number] Date of Birth/Sex: February 14, 1951 (67 y.o. M) Treating RN: Secundino Ginger Primary Care Othon Guardia: [name] Other Clinician: Referring Zyann Mabry: [name] Treating Ramya Vanbergen/Extender: Cathie Olden in Treatment: 51 Wound Status Wound Number: 5 Primary Diabetic Wound/Ulcer of the Lower Extremity Etiology: Wound Location: Left Toe Great Wound Open Wounding Event: Gradually Appeared Status: Date Acquired: 06/11/2016 Comorbid Anemia, Lymphedema, Congestive Heart Weeks Of Treatment: 51 History: Failure, Hypertension, Peripheral Venous Clustered Wound: No Disease, Type II Diabetes, Neuropathy Pending Amputation On Presentation Photos Wound Measurements Length: (cm) 0.5 % Reduction in Width: (cm) 0.5 % Reduction in Depth: (cm) 0.1 Epithelializat Area: (cm) 0.196 Tunneling: Volume: (cm) 0.02 Undermining: Area: 98.6% Volume: 98.6% ion: None No No Wound Description Classification: Grade 2 Foul Odor Afte Wound Margin: Flat and Intact Slough/Fibrino Exudate Amount: Medium Exudate Type: Serous Exudate Color: amber r Cleansing: No Yes Wound Bed Granulation Amount: Large (67-100%) Exposed Structure Granulation Quality: Red Fascia Exposed: No Necrotic Amount: None Present (0%) Fat Layer (Subcutaneous Tissue) Exposed: No Tendon Exposed: No Muscle Exposed: No Joint Exposed: No Bone Exposed: No Periwound Skin Texture Massmann, Dreyton E. ([id number]) Texture Color No Abnormalities Noted: No No Abnormalities Noted: No Callus: No Atrophie Blanche: No Crepitus: No Cyanosis: No Excoriation: No Ecchymosis: No Induration: No Erythema: No Rash: No Hemosiderin Staining: No Scarring: No Mottled: No Pallor: No Moisture Rubor: No No Abnormalities Noted: No Dry / Scaly: No Temperature / Pain Maceration: No Temperature: No Abnormality Wound Preparation Ulcer  Cleansing: Rinsed/Irrigated with Saline Topical Anesthetic Applied: Other: lidocaine 4%, Treatment Notes Wound #5 (Left Toe Great) 1. Cleansed with: Clean wound with Normal Saline 2. Anesthetic Topical Lidocaine 4% cream to wound bed prior to debridement 4. Dressing Applied: Other dressing (specify in notes) 5. Secondary Dressing Applied ABD Pad Dry Gauze Notes silvercell. conform and tape Electronic Signature(s) Signed: 09/20/2017 8:10:48 AM By: Alric Quan Signed: 09/20/2017 3:41:29 PM By: Secundino Ginger Previous Signature: 09/19/2017 2:00:15 PM Version By: Secundino Ginger Entered By: Alric Quan on 09/20/2017 08:10:47 Zirkle, [name] ([id number]) -------------------------------------------------------------------------------- Wound Assessment Details Patient Name: Lanigan, Jadan E. Date of Service: 09/19/2017 12:30 PM Medical Record Number: [id number] Patient Account Number: [id number] Date of Birth/Sex: 12/05/1950 (67 y.o. M) Treating RN: Secundino Ginger Primary Care Keilany Burnette: [name] Other Clinician: Referring Jaymason Ledesma: [name] Treating Thresea Doble/Extender: Cathie Olden in Treatment: 51 Wound Status Wound Number: 6 Primary Diabetic Wound/Ulcer of the Lower Etiology: Extremity Wound Location: Left Toe Second Wound Status: Healed - Epithelialized Wounding Event: Gradually Appeared Date Acquired: 06/11/2016 Weeks Of Treatment: 51 Clustered Wound: No Pending Amputation On Presentation Photos Photo Uploaded By: Secundino Ginger on 09/19/2017 13:18:46 Wound Measurements Length: (cm) 0 % Red Width: (cm) 0 %  Red Depth: (cm) 0 Area: (cm) 0 Volume: (cm) 0 uction in Area: 100% uction in Volume: 100% Wound Description Classification: Grade 2 Periwound Skin Texture Texture Color No Abnormalities Noted: No No Abnormalities Noted: No Moisture No Abnormalities Noted: No Electronic Signature(s) Signed: 09/19/2017 2:00:15 PM By: Secundino Ginger Entered By: Secundino Ginger on 09/19/2017  13:05:44 Hallmon, [name] ([id number]) -------------------------------------------------------------------------------- Vitals Details Patient Name: Kittrell, Keaten E. Date of Service: 09/19/2017 12:30 PM Medical Record Number: [id number] Patient Account Number: [id number] Date of Birth/Sex: 09-02-1950 (67 y.o. M) Treating RN: Secundino Ginger Primary Care Issachar Broady: [name] Other Clinician: Referring Derreck Wiltsey: [name] Treating Taleshia Luff/Extender: Cathie Olden in Treatment: 27 Vital Signs Time Taken: 12:47 Temperature (F): 98 Height (in): 69 Pulse (bpm): 54 Weight (lbs): 168 Respiratory Rate (breaths/min): 16 Body Mass Index (BMI): 24.8 Blood Pressure (mmHg): 151/72 Reference Range: 80 - 120 mg / dl Electronic Signature(s) Signed: 09/19/2017 2:00:15 PM By: Secundino Ginger Entered By: Secundino Ginger on 09/19/2017 12:48:03

## 2017-09-26 ENCOUNTER — Encounter: Payer: Medicare HMO | Admitting: Nurse Practitioner

## 2017-09-26 DIAGNOSIS — E11621 Type 2 diabetes mellitus with foot ulcer: Secondary | ICD-10-CM | POA: Diagnosis not present

## 2017-09-27 ENCOUNTER — Ambulatory Visit: Payer: Medicare HMO | Admitting: Radiation Oncology

## 2017-09-28 NOTE — Progress Notes (Signed)
COLESTON, DIROSA ([id number]) Visit Report for 09/26/2017 Arrival Information Details Patient Name: [name], [name] Date of Service: 09/26/2017 8:45 AM Medical Record Number: [id number] Patient Account Number: [id number] Date of Birth/Sex: 10-19-1950 (67 y.o. M) Treating RN: Roger Shelter Primary Care Nishi Neiswonger: Lamonte Sakai Other Clinician: Referring Rivan Siordia: Lamonte Sakai Treating Lida Berkery/Extender: Cathie Olden in Treatment: 48 Visit Information History Since Last Visit All ordered tests and consults were completed: No Patient Arrived: Ambulatory Added or deleted any medications: No Arrival Time: 08:50 Any new allergies or adverse reactions: No Accompanied By: brother n law Had a fall or experienced change in No activities of daily living that may affect Transfer Assistance: None risk of falls: Patient Identification Verified: Yes Signs or symptoms of abuse/neglect since last visito No Secondary Verification Process Completed: Yes Hospitalized since last visit: No Patient Requires Transmission-Based No Implantable device outside of the clinic excluding No Precautions: cellular tissue based products placed in the center Patient Has Alerts: Yes since last visit: Patient Alerts: DMII Pain Present Now: No Electronic Signature(s) Signed: 09/26/2017 10:41:08 AM By: Roger Shelter Entered By: Roger Shelter on 09/26/2017 08:51:25 Plotner, [name] ([id number]) -------------------------------------------------------------------------------- Clinic Level of Care Assessment Details Patient Name: Nesheim, [name] Date of Service: 09/26/2017 8:45 AM Medical Record Number: [id number] Patient Account Number: [id number] Date of Birth/Sex: May 10, 1950 (67 y.o. M) Treating RN: Ahmed Prima Primary Care Sohum Delillo: Lamonte Sakai Other Clinician: Referring Etoile Looman: Lamonte Sakai Treating Kalep Full/Extender: Cathie Olden in Treatment: 62 Clinic Level of Care Assessment  Items TOOL 4 Quantity Score X - Use when only an EandM is performed on FOLLOW-UP visit 1 0 ASSESSMENTS - Nursing Assessment / Reassessment X - Reassessment of Co-morbidities (includes updates in patient status) 1 10 X- 1 5 Reassessment of Adherence to Treatment Plan ASSESSMENTS - Wound and Skin Assessment / Reassessment []  - Simple Wound Assessment / Reassessment - one wound 0 X- 5 5 Complex Wound Assessment / Reassessment - multiple wounds []  - 0 Dermatologic / Skin Assessment (not related to wound area) ASSESSMENTS - Focused Assessment []  - Circumferential Edema Measurements - multi extremities 0 []  - 0 Nutritional Assessment / Counseling / Intervention []  - 0 Lower Extremity Assessment (monofilament, tuning fork, pulses) []  - 0 Peripheral Arterial Disease Assessment (using hand held doppler) ASSESSMENTS - Ostomy and/or Continence Assessment and Care []  - Incontinence Assessment and Management 0 []  - 0 Ostomy Care Assessment and Management (repouching, etc.) PROCESS - Coordination of Care X - Simple Patient / Family Education for ongoing care 1 15 []  - 0 Complex (extensive) Patient / Family Education for ongoing care []  - 0 Staff obtains Programmer, systems, Records, Test Results / Process Orders []  - 0 Staff telephones HHA, Nursing Homes / Clarify orders / etc []  - 0 Routine Transfer to another Facility (non-emergent condition) []  - 0 Routine Hospital Admission (non-emergent condition) []  - 0 New Admissions / Biomedical engineer / Ordering NPWT, Apligraf, etc. []  - 0 Emergency Hospital Admission (emergent condition) X- 1 10 Simple Discharge Coordination Hofmeister, Aldwin E. ([id number]) []  - 0 Complex (extensive) Discharge Coordination PROCESS - Special Needs []  - Pediatric / Minor Patient Management 0 []  - 0 Isolation Patient Management []  - 0 Hearing / Language / Visual special needs []  - 0 Assessment of Community assistance (transportation, D/C planning, etc.) []  -  0 Additional assistance / Altered mentation []  - 0 Support Surface(s) Assessment (bed, cushion, seat, etc.) INTERVENTIONS - Wound Cleansing / Measurement []  - Simple Wound Cleansing - one wound 0 X- 5  5 Complex Wound Cleansing - multiple wounds []  - 0 Wound Imaging (photographs - any number of wounds) []  - 0 Wound Tracing (instead of photographs) []  - 0 Simple Wound Measurement - one wound X- 5 5 Complex Wound Measurement - multiple wounds INTERVENTIONS - Wound Dressings []  - Small Wound Dressing one or multiple wounds 0 X- 2 15 Medium Wound Dressing one or multiple wounds []  - 0 Large Wound Dressing one or multiple wounds X- 1 5 Application of Medications - topical []  - 0 Application of Medications - injection INTERVENTIONS - Miscellaneous []  - External ear exam 0 []  - 0 Specimen Collection (cultures, biopsies, blood, body fluids, etc.) []  - 0 Specimen(s) / Culture(s) sent or taken to Lab for analysis []  - 0 Patient Transfer (multiple staff / Civil Service fast streamer / Similar devices) []  - 0 Simple Staple / Suture removal (25 or less) []  - 0 Complex Staple / Suture removal (26 or more) []  - 0 Hypo / Hyperglycemic Management (close monitor of Blood Glucose) []  - 0 Ankle / Brachial Index (ABI) - do not check if billed separately X- 1 5 Vital Signs Blanchfield, Amdrew E. ([id number]) Has the patient been seen at the hospital within the last three years: Yes Total Score: 155 Level Of Care: New/Established - Level 4 Electronic Signature(s) Signed: 09/26/2017 4:29:22 PM By: Alric Quan Entered By: Alric Quan on 09/26/2017 10:00:26 Reckner, [name] ([id number]) -------------------------------------------------------------------------------- Encounter Discharge Information Details Patient Name: [name], [name] Date of Service: 09/26/2017 8:45 AM Medical Record Number: [id number] Patient Account Number: [id number] Date of Birth/Sex: 21-Oct-1950 (67 y.o. M) Treating RN: Roger Shelter Primary Care Meyer [name]: Lamonte Sakai Other Clinician: Referring Brynleigh Sequeira: Lamonte Sakai Treating Tyyonna Soucy/Extender: Cathie Olden in Treatment: 56 Encounter Discharge Information Items Discharge Condition: Stable Ambulatory Status: Ambulatory Discharge Destination: Home Transportation: Private Auto Schedule Follow-up Appointment: Yes Clinical Summary of Care: Electronic Signature(s) Signed: 09/26/2017 10:41:08 AM By: Roger Shelter Entered By: Roger Shelter on 09/26/2017 09:48:11 Cuyler, [name] ([id number]) -------------------------------------------------------------------------------- Lower Extremity Assessment Details Patient Name: Recinos, Jurrell E. Date of Service: 09/26/2017 8:45 AM Medical Record Number: [id number] Patient Account Number: [id number] Date of Birth/Sex: 01/25/1951 (67 y.o. M) Treating RN: Roger Shelter Primary Care Tiffony Kite: Lamonte Sakai Other Clinician: Referring Rayneisha Bouza: Lamonte Sakai Treating Whitlee Sluder/Extender: Cathie Olden in Treatment: 52 Edema Assessment Assessed: [Left: No] [Right: No] Edema: [Left: Yes] [Right: Yes] Calf Left: Right: Point of Measurement: 34 cm From Medial Instep 33.6 cm 36.8 cm Ankle Left: Right: Point of Measurement: 12 cm From Medial Instep 24 cm 24.5 cm Vascular Assessment Claudication: Claudication Assessment [Left:None] [Right:None] Pulses: Dorsalis Pedis Palpable: [Left:Yes] [Right:Yes] Posterior Tibial Extremity colors, hair growth, and conditions: Extremity Color: [Left:Mottled] [Right:Normal] Hair Growth on Extremity: [Left:No] [Right:No] Temperature of Extremity: [Left:Warm] [Right:Warm] Capillary Refill: [Left:< 3 seconds] [Right:< 3 seconds] Toe Nail Assessment Left: Right: Thick: No No Discolored: No No Deformed: No No Improper Length and Hygiene: No No Electronic Signature(s) Signed: 09/26/2017 10:41:08 AM By: Roger Shelter Entered By: Roger Shelter on 09/26/2017  09:05:37 Cheever, [name] ([id number]) -------------------------------------------------------------------------------- Multi Wound Chart Details Patient Name: Warren, Glenroy E. Date of Service: 09/26/2017 8:45 AM Medical Record Number: [id number] Patient Account Number: [id number] Date of Birth/Sex: [birth year] (67 y.o. M) Treating RN: Ahmed Prima Primary Care Shauntay Brunelli: Lamonte Sakai Other Clinician: Referring Yerik Zeringue: Lamonte Sakai Treating Elleanna Melling/Extender: Cathie Olden in Treatment: 25 Vital Signs Height(in): 69 Pulse(bpm): 64 Weight(lbs): 168 Blood Pressure(mmHg): 167/78 Body Mass Index(BMI): 25 Temperature(F): 98.2 Respiratory Rate 16 (breaths/min): Photos: [1:No Photos] [  14:No Photos] [15:No Photos] Wound Location: [1:Right Toe Great] [14:Left Toe Second] [15:Left Toe Fourth] Wounding Event: [1:Gradually Appeared] [14:Gradually Appeared] [15:Gradually Appeared] Primary Etiology: [1:Diabetic Wound/Ulcer of the Lower Extremity] [14:Diabetic Wound/Ulcer of the Lower Extremity] [15:Diabetic Wound/Ulcer of the Lower Extremity] Comorbid History: [1:Anemia, Lymphedema, Congestive Heart Failure, Hypertension, Peripheral Venous Disease, Type II Diabetes, Neuropathy] [14:Anemia, Lymphedema, Congestive Heart Failure, Hypertension, Peripheral Venous Disease, Type II Diabetes,  Neuropathy] [15:Anemia, Lymphedema, Congestive Heart Failure, Hypertension, Peripheral Venous Disease, Type II Diabetes, Neuropathy] Date Acquired: [1:06/11/2016] [14:09/26/2017] [15:09/26/2017] Weeks of Treatment: [1:52] [14:0] [15:0] Wound Status: [1:Open] [14:Open] [15:Open] Pending Amputation on [1:Yes] [14:No] [15:No] Presentation: Measurements L x W x D [1:0.5x1.3x0.1] [14:1.5x2x0.1] [15:0.4x0.3x0.1] (cm) Area (cm) : [1:0.511] [14:2.356] [15:0.094] Volume (cm) : [1:0.051] [14:0.236] [15:0.009] % Reduction in Area: [1:98.90%] [14:N/A] [15:N/A] % Reduction in Volume: [1:98.90%] [14:N/A]  [15:N/A] Classification: [1:Grade 2] [14:Grade 1] [15:Grade 1] Exudate Amount: [1:Medium] [14:Large] [15:Large] Exudate Type: [1:Serosanguineous] [14:Serous] [15:Serous] Exudate Color: [1:red, brown] [14:amber] [15:amber] Wound Margin: [1:Indistinct, nonvisible] [14:Distinct, outline attached] [15:Distinct, outline attached] Granulation Amount: [1:Small (1-33%)] [14:Medium (34-66%)] [15:Large (67-100%)] Granulation Quality: [1:Red] [14:Red] [15:Pink] Necrotic Amount: [1:Small (1-33%)] [14:Medium (34-66%)] [15:Small (1-33%)] Exposed Structures: [1:Fascia: No Fat Layer (Subcutaneous Tissue) Exposed: No Tendon: No Muscle: No Joint: No Bone: No] [14:Fascia: No Fat Layer (Subcutaneous Tissue) Exposed: No Tendon: No Muscle: No Joint: No Bone: No] [15:Fascia: No Fat Layer (Subcutaneous Tissue)  Exposed: No Tendon: No Muscle: No Joint: No Bone: No] Epithelialization: [1:Medium (34-66%)] [14:None] [15:None] Periwound Skin Texture: Callus: Yes No Abnormalities Noted No Abnormalities Noted Scarring: Yes Excoriation: No Induration: No Crepitus: No Rash: No Periwound Skin Moisture: Maceration: No Maceration: Yes No Abnormalities Noted Dry/Scaly: No Periwound Skin Color: Atrophie Blanche: No No Abnormalities Noted No Abnormalities Noted Cyanosis: No Ecchymosis: No Erythema: No Hemosiderin Staining: No Mottled: No Pallor: No Rubor: No Temperature: No Abnormality No Abnormality No Abnormality Tenderness on Palpation: No Yes Yes Wound Preparation: Ulcer Cleansing: Ulcer Cleansing: Ulcer Cleansing: Rinsed/Irrigated with Saline Rinsed/Irrigated with Saline Rinsed/Irrigated with Saline Topical Anesthetic Applied: Topical Anesthetic Applied: Topical Anesthetic Applied: Other: lidocaine 4% Other: lidocaine 4% Other: lidocaine 4% Wound Number: 2 5 N/A Photos: No Photos No Photos N/A Wound Location: Right Toe Second Left Toe Great N/A Wounding Event: Gradually Appeared Gradually Appeared  N/A Primary Etiology: Diabetic Wound/Ulcer of the Diabetic Wound/Ulcer of the N/A Lower Extremity Lower Extremity Comorbid History: Anemia, Lymphedema, Anemia, Lymphedema, N/A Congestive Heart Failure, Congestive Heart Failure, Hypertension, Peripheral Hypertension, Peripheral Venous Disease, Type II Venous Disease, Type II Diabetes, Neuropathy Diabetes, Neuropathy Date Acquired: 06/11/2016 06/11/2016 N/A Weeks of Treatment: 52 52 N/A Wound Status: Open Open N/A Pending Amputation on Yes Yes N/A Presentation: Measurements L x W x D 0.5x0.6x0.1 0.6x0.6x0.1 N/A (cm) Area (cm) : 0.236 0.283 N/A Volume (cm) : 0.024 0.028 N/A % Reduction in Area: 92.30% 98.00% N/A % Reduction in Volume: 92.20% 98.00% N/A Classification: Grade 2 Grade 2 N/A Exudate Amount: Medium Medium N/A Exudate Type: Serous Serous N/A Exudate Color: amber amber N/A Wound Margin: Flat and Intact Flat and Intact N/A Granulation Amount: Large (67-100%) Large (67-100%) N/A Granulation Quality: Red Red N/A Necrotic Amount: None Present (0%) None Present (0%) N/A Exposed Structures: Fascia: No Fascia: No N/A Fat Layer (Subcutaneous Fat Layer (Subcutaneous Tissue) Exposed: No Tissue) Exposed: No Tendon: No Tendon: No Ragone, Haleem E. ([id number]) Muscle: No Muscle: No Joint: No Joint: No Bone: No Bone: No Epithelialization: Large (67-100%) None N/A Periwound Skin Texture: Callus: Yes Excoriation: No N/A Excoriation: No Induration:  No Induration: No Callus: No Crepitus: No Crepitus: No Rash: No Rash: No Scarring: No Scarring: No Periwound Skin Moisture: Maceration: No Maceration: No N/A Dry/Scaly: No Dry/Scaly: No Periwound Skin Color: Atrophie Blanche: No Atrophie Blanche: No N/A Cyanosis: No Cyanosis: No Ecchymosis: No Ecchymosis: No Erythema: No Erythema: No Hemosiderin Staining: No Hemosiderin Staining: No Mottled: No Mottled: No Pallor: No Pallor: No Rubor: No Rubor:  No Temperature: No Abnormality No Abnormality N/A Tenderness on Palpation: No No N/A Wound Preparation: Ulcer Cleansing: Ulcer Cleansing: N/A Rinsed/Irrigated with Saline Rinsed/Irrigated with Saline Topical Anesthetic Applied: Topical Anesthetic Applied: Other: lidocaine 4% Other: lidocaine 4% Treatment Notes Wound #1 (Right Toe Great) 1. Cleansed with: Clean wound with Normal Saline 2. Anesthetic Topical Lidocaine 4% cream to wound bed prior to debridement 4. Dressing Applied: Other dressing (specify in notes) 5. Secondary Dressing Applied ABD Pad Kerlix/Conform Notes silvercell Wound #14 (Left Toe Second) 1. Cleansed with: Clean wound with Normal Saline 2. Anesthetic Topical Lidocaine 4% cream to wound bed prior to debridement 4. Dressing Applied: Other dressing (specify in notes) 5. Secondary Dressing Applied ABD Pad Kerlix/Conform Notes silvercell Stambaugh, Bell E. ([id number]) Wound #15 (Left Toe Third) 1. Cleansed with: Clean wound with Normal Saline 2. Anesthetic Topical Lidocaine 4% cream to wound bed prior to debridement 4. Dressing Applied: Other dressing (specify in notes) 5. Secondary Dressing Applied ABD Pad Kerlix/Conform Notes silvercell Wound #2 (Right Toe Second) 1. Cleansed with: Clean wound with Normal Saline 2. Anesthetic Topical Lidocaine 4% cream to wound bed prior to debridement 4. Dressing Applied: Other dressing (specify in notes) 5. Secondary Dressing Applied ABD Pad Kerlix/Conform Notes silvercell Wound #5 (Left Toe Great) 1. Cleansed with: Clean wound with Normal Saline 2. Anesthetic Topical Lidocaine 4% cream to wound bed prior to debridement 4. Dressing Applied: Other dressing (specify in notes) 5. Secondary Dressing Applied ABD Pad Kerlix/Conform Notes silvercell Electronic Signature(s) Signed: 09/26/2017 12:40:56 PM By: Lawanda Cousins Entered By: Lawanda Cousins on 09/26/2017 12:40:56 Viereck, [name]  ([id number]) -------------------------------------------------------------------------------- [place] Details Patient Name: Jay, Mishael E. Date of Service: 09/26/2017 8:45 AM Medical Record Number: [id number] Patient Account Number: [id number] Date of Birth/Sex: 1950-12-19 (67 y.o. M) Treating RN: Ahmed Prima Primary Care Liesl Simons: Lamonte Sakai Other Clinician: Referring Thea Holshouser: Lamonte Sakai Treating Emmalina Espericueta/Extender: Cathie Olden in Treatment: 65 Active Inactive ` Abuse / Safety / Falls / Self Care Management Nursing Diagnoses: Potential for falls Goals: Patient will remain injury free related to falls Date Initiated: 09/21/2016 Target Resolution Date: 09/14/2017 Goal Status: Active Interventions: Assess fall risk on admission and as needed Notes: ` Nutrition Nursing Diagnoses: Potential for alteratiion in Nutrition/Potential for imbalanced nutrition Goals: Patient/caregiver agrees to and verbalizes understanding of need to use nutritional supplements and/or vitamins as prescribed Date Initiated: 09/21/2016 Target Resolution Date: 09/14/2017 Goal Status: Active Interventions: Assess patient nutrition upon admission and as needed per policy Notes: ` Orientation to the Wound Care Program Nursing Diagnoses: Knowledge deficit related to the wound healing center program Goals: Patient/caregiver will verbalize understanding of the [facility] Date Initiated: 09/21/2016 Target Resolution Date: 06/15/2017 Goal Status: Active Interventions: DARKJaydis, Duchene ([id number]) Provide education on orientation to the wound center Notes: ` Wound/Skin Impairment Nursing Diagnoses: Knowledge deficit related to smoking impact on wound healing Goals: Ulcer/skin breakdown will have a volume reduction of 30% by week 4 Date Initiated: 09/21/2016 Target Resolution Date: 08/17/2017 Goal Status: Active Ulcer/skin breakdown will have a volume  reduction of 50% by week 8 Date Initiated:  09/21/2016 Target Resolution Date: 08/17/2017 Goal Status: Active Ulcer/skin breakdown will have a volume reduction of 80% by week 12 Date Initiated: 09/21/2016 Target Resolution Date: 09/14/2017 Goal Status: Active Ulcer/skin breakdown will heal within 14 weeks Date Initiated: 09/21/2016 Target Resolution Date: 08/17/2017 Goal Status: Active Interventions: Assess patient/caregiver ability to obtain necessary supplies Assess patient/caregiver ability to perform ulcer/skin care regimen upon admission and as needed Assess ulceration(s) every visit Notes: Electronic Signature(s) Signed: 09/26/2017 4:29:22 PM By: Alric Quan Entered By: Alric Quan on 09/26/2017 09:20:01 Shultis, Arael E. ([id number]) -------------------------------------------------------------------------------- Pain Assessment Details Patient Name: Lipari, Merek E. Date of Service: 09/26/2017 8:45 AM Medical Record Number: [id number] Patient Account Number: [id number] Date of Birth/Sex: [birth year] (67 y.o. M) Treating RN: Roger Shelter Primary Care Aramis Zobel: Lamonte Sakai Other Clinician: Referring Tulip Meharg: Lamonte Sakai Treating Elwanda Moger/Extender: Cathie Olden in Treatment: 72 Active Problems Location of Pain Severity and Description of Pain Patient Has Paino No Site Locations Pain Management and Medication Current Pain Management: Electronic Signature(s) Signed: 09/26/2017 10:41:08 AM By: Roger Shelter Entered By: Roger Shelter on 09/26/2017 08:59:48 Zaccaro, [name] ([id number]) -------------------------------------------------------------------------------- Patient/Caregiver Education Details Patient Name: Dehart, Hrishikesh E. Date of Service: 09/26/2017 8:45 AM Medical Record Number: [id number] Patient Account Number: [id number] Date of Birth/Gender: 04-May-1950 (67 y.o. M) Treating RN: Roger Shelter Primary Care Physician: Lamonte Sakai Other  Clinician: Referring Physician: Lamonte Sakai Treating Physician/Extender: Cathie Olden in Treatment: 80 Education Assessment Education Provided To: Patient Education Topics Provided Wound/Skin Impairment: Handouts: Caring for Your Ulcer Methods: Explain/Verbal Responses: State content correctly Electronic Signature(s) Signed: 09/26/2017 10:41:08 AM By: Roger Shelter Entered By: Roger Shelter on 09/26/2017 09:48:28 Vondra, Ralphael Johnette Abraham ([id number]) -------------------------------------------------------------------------------- Wound Assessment Details Patient Name: Denomme, Mads E. Date of Service: 09/26/2017 8:45 AM Medical Record Number: [id number] Patient Account Number: [id number] Date of Birth/Sex: 1950-05-06 (67 y.o. M) Treating RN: Roger Shelter Primary Care Atthew Coutant: Lamonte Sakai Other Clinician: Referring Devereaux Grayson: Lamonte Sakai Treating Meshawn Oconnor/Extender: Cathie Olden in Treatment: 52 Wound Status Wound Number: 1 Primary Diabetic Wound/Ulcer of the Lower Extremity Etiology: Wound Location: Right Toe Great Wound Open Wounding Event: Gradually Appeared Status: Date Acquired: 06/11/2016 Comorbid Anemia, Lymphedema, Congestive Heart Weeks Of Treatment: 52 History: Failure, Hypertension, Peripheral Venous Clustered Wound: No Disease, Type II Diabetes, Neuropathy Pending Amputation On Presentation Photos Photo Uploaded By: Roger Shelter on 09/26/2017 16:17:15 Wound Measurements Length: (cm) 0.5 Width: (cm) 1.3 Depth: (cm) 0.1 Area: (cm) 0.511 Volume: (cm) 0.051 % Reduction in Area: 98.9% % Reduction in Volume: 98.9% Epithelialization: Medium (34-66%) Tunneling: No Undermining: No Wound Description Classification: Grade 2 Wound Margin: Indistinct, nonvisible Exudate Amount: Medium Exudate Type: Serosanguineous Exudate Color: red, brown Foul Odor After Cleansing: No Slough/Fibrino Yes Wound Bed Granulation Amount: Small (1-33%) Exposed  Structure Granulation Quality: Red Fascia Exposed: No Necrotic Amount: Small (1-33%) Fat Layer (Subcutaneous Tissue) Exposed: No Necrotic Quality: Adherent Slough Tendon Exposed: No Muscle Exposed: No Joint Exposed: No Bone Exposed: No Periwound Skin Texture Reckner, Feras E. ([id number]) Texture Color No Abnormalities Noted: No No Abnormalities Noted: No Callus: Yes Atrophie Blanche: No Crepitus: No Cyanosis: No Excoriation: No Ecchymosis: No Induration: No Erythema: No Rash: No Hemosiderin Staining: No Scarring: Yes Mottled: No Pallor: No Moisture Rubor: No No Abnormalities Noted: No Dry / Scaly: No Temperature / Pain Maceration: No Temperature: No Abnormality Wound Preparation Ulcer Cleansing: Rinsed/Irrigated with Saline Topical Anesthetic Applied: Other: lidocaine 4%, Treatment Notes Wound #1 (Right Toe Great) 1. Cleansed with: Clean wound with Normal Saline 2. Anesthetic Topical Lidocaine 4% cream  to wound bed prior to debridement 4. Dressing Applied: Other dressing (specify in notes) 5. Secondary Dressing Applied ABD Pad Kerlix/Conform Notes silvercell Electronic Signature(s) Signed: 09/26/2017 10:41:08 AM By: Roger Shelter Entered By: Roger Shelter on 09/26/2017 09:08:35 Harney, [name] ([id number]) -------------------------------------------------------------------------------- Wound Assessment Details Patient Name: Dampier, Vishwa E. Date of Service: 09/26/2017 8:45 AM Medical Record Number: [id number] Patient Account Number: [id number] Date of Birth/Sex: 1950-07-17 (67 y.o. M) Treating RN: Ahmed Prima Primary Care Camiyah Friberg: Lamonte Sakai Other Clinician: Referring Zaven Klemens: Lamonte Sakai Treating Aicha Clingenpeel/Extender: Cathie Olden in Treatment: 52 Wound Status Wound Number: 14 Primary Diabetic Wound/Ulcer of the Lower Extremity Etiology: Wound Location: Left Toe Second Wound Open Wounding Event: Gradually Appeared Status: Date  Acquired: 09/26/2017 Comorbid Anemia, Lymphedema, Congestive Heart Weeks Of Treatment: 0 History: Failure, Hypertension, Peripheral Venous Clustered Wound: No Disease, Type II Diabetes, Neuropathy Photos Photo Uploaded By: Roger Shelter on 09/26/2017 16:17:16 Wound Measurements Length: (cm) 1.5 Width: (cm) 2 Depth: (cm) 0.1 Area: (cm) 2.356 Volume: (cm) 0.236 % Reduction in Area: % Reduction in Volume: Epithelialization: None Tunneling: No Undermining: No Wound Description Classification: Grade 1 Wound Margin: Distinct, outline attached Exudate Amount: Large Exudate Type: Serous Exudate Color: amber Foul Odor After Cleansing: No Slough/Fibrino Yes Wound Bed Granulation Amount: Medium (34-66%) Exposed Structure Granulation Quality: Red Fascia Exposed: No Necrotic Amount: Medium (34-66%) Fat Layer (Subcutaneous Tissue) Exposed: No Necrotic Quality: Adherent Slough Tendon Exposed: No Muscle Exposed: No Joint Exposed: No Bone Exposed: No Periwound Skin Texture Kirkwood, Justis E. ([id number]) Texture Color No Abnormalities Noted: No No Abnormalities Noted: No Moisture Temperature / Pain No Abnormalities Noted: No Temperature: No Abnormality Maceration: Yes Tenderness on Palpation: Yes Wound Preparation Ulcer Cleansing: Rinsed/Irrigated with Saline Topical Anesthetic Applied: Other: lidocaine 4%, Treatment Notes Wound #14 (Left Toe Second) 1. Cleansed with: Clean wound with Normal Saline 2. Anesthetic Topical Lidocaine 4% cream to wound bed prior to debridement 4. Dressing Applied: Other dressing (specify in notes) 5. Secondary Dressing Applied ABD Pad Kerlix/Conform Notes silvercell Electronic Signature(s) Signed: 09/26/2017 4:29:22 PM By: Alric Quan Entered By: Alric Quan on 09/26/2017 09:22:41 Auman, Tonya EMarland Kitchen ([id number]) -------------------------------------------------------------------------------- Wound Assessment Details Patient  Name: Hsia, Kavon E. Date of Service: 09/26/2017 8:45 AM Medical Record Number: [id number] Patient Account Number: [id number] Date of Birth/Sex: 1950-06-08 (67 y.o. M) Treating RN: Ahmed Prima Primary Care Peyten Punches: Lamonte Sakai Other Clinician: Referring Raza Bayless: Lamonte Sakai Treating Kion Huntsberry/Extender: Cathie Olden in Treatment: 52 Wound Status Wound Number: 15 Primary Diabetic Wound/Ulcer of the Lower Extremity Etiology: Wound Location: Left Toe Fourth Wound Open Wounding Event: Gradually Appeared Status: Date Acquired: 09/26/2017 Comorbid Anemia, Lymphedema, Congestive Heart Weeks Of Treatment: 0 History: Failure, Hypertension, Peripheral Venous Clustered Wound: No Disease, Type II Diabetes, Neuropathy Wound Measurements Length: (cm) 0.4 Width: (cm) 0.3 Depth: (cm) 0.1 Area: (cm) 0.094 Volume: (cm) 0.009 % Reduction in Area: % Reduction in Volume: Epithelialization: None Tunneling: No Undermining: No Wound Description Classification: Grade 1 Wound Margin: Distinct, outline attached Exudate Amount: Large Exudate Type: Serous Exudate Color: amber Foul Odor After Cleansing: No Slough/Fibrino Yes Wound Bed Granulation Amount: Large (67-100%) Exposed Structure Granulation Quality: Pink Fascia Exposed: No Necrotic Amount: Small (1-33%) Fat Layer (Subcutaneous Tissue) Exposed: No Necrotic Quality: Adherent Slough Tendon Exposed: No Muscle Exposed: No Joint Exposed: No Bone Exposed: No Periwound Skin Texture Texture Color No Abnormalities Noted: No No Abnormalities Noted: No Moisture Temperature / Pain No Abnormalities Noted: No Temperature: No Abnormality Tenderness on Palpation: Yes Wound Preparation Ulcer Cleansing: Rinsed/Irrigated with Saline  Topical Anesthetic Applied: Other: lidocaine 4%, Electronic Signature(s) Signed: 09/26/2017 4:29:22 PM By: Warnell Bureau ([id number]) Entered By: Alric Quan on 09/26/2017  09:23:50 Soller, [name] ([id number]) -------------------------------------------------------------------------------- Wound Assessment Details Patient Name: Badal, Kaylib E. Date of Service: 09/26/2017 8:45 AM Medical Record Number: [id number] Patient Account Number: [id number] Date of Birth/Sex: 11/08/1950 (67 y.o. M) Treating RN: Roger Shelter Primary Care Adarrius Graeff: Lamonte Sakai Other Clinician: Referring Haislee Corso: Lamonte Sakai Treating Akiera Allbaugh/Extender: Cathie Olden in Treatment: 52 Wound Status Wound Number: 2 Primary Diabetic Wound/Ulcer of the Lower Extremity Etiology: Wound Location: Right Toe Second Wound Open Wounding Event: Gradually Appeared Status: Date Acquired: 06/11/2016 Comorbid Anemia, Lymphedema, Congestive Heart Weeks Of Treatment: 52 History: Failure, Hypertension, Peripheral Venous Clustered Wound: No Disease, Type II Diabetes, Neuropathy Pending Amputation On Presentation Photos Photo Uploaded By: Roger Shelter on 09/26/2017 16:17:58 Wound Measurements Length: (cm) 0.5 Width: (cm) 0.6 Depth: (cm) 0.1 Area: (cm) 0.236 Volume: (cm) 0.024 % Reduction in Area: 92.3% % Reduction in Volume: 92.2% Epithelialization: Large (67-100%) Tunneling: No Undermining: No Wound Description Classification: Grade 2 Wound Margin: Flat and Intact Exudate Amount: Medium Exudate Type: Serous Exudate Color: amber Foul Odor After Cleansing: No Slough/Fibrino No Wound Bed Granulation Amount: Large (67-100%) Exposed Structure Granulation Quality: Red Fascia Exposed: No Necrotic Amount: None Present (0%) Fat Layer (Subcutaneous Tissue) Exposed: No Tendon Exposed: No Muscle Exposed: No Joint Exposed: No Bone Exposed: No Periwound Skin Texture Bibbee, Lewellyn E. ([id number]) Texture Color No Abnormalities Noted: No No Abnormalities Noted: No Callus: Yes Atrophie Blanche: No Crepitus: No Cyanosis: No Excoriation: No Ecchymosis: No Induration:  No Erythema: No Rash: No Hemosiderin Staining: No Scarring: No Mottled: No Pallor: No Moisture Rubor: No No Abnormalities Noted: No Dry / Scaly: No Temperature / Pain Maceration: No Temperature: No Abnormality Wound Preparation Ulcer Cleansing: Rinsed/Irrigated with Saline Topical Anesthetic Applied: Other: lidocaine 4%, Treatment Notes Wound #2 (Right Toe Second) 1. Cleansed with: Clean wound with Normal Saline 2. Anesthetic Topical Lidocaine 4% cream to wound bed prior to debridement 4. Dressing Applied: Other dressing (specify in notes) 5. Secondary Dressing Applied ABD Pad Kerlix/Conform Notes silvercell Electronic Signature(s) Signed: 09/26/2017 10:41:08 AM By: Roger Shelter Entered By: Roger Shelter on 09/26/2017 09:09:03 Holm, [name] ([id number]) -------------------------------------------------------------------------------- Wound Assessment Details Patient Name: Knoth, Aden E. Date of Service: 09/26/2017 8:45 AM Medical Record Number: [id number] Patient Account Number: [id number] Date of Birth/Sex: 08-19-1950 (67 y.o. M) Treating RN: Roger Shelter Primary Care Dasean Brow: Lamonte Sakai Other Clinician: Referring Swan [place]: Lamonte Sakai Treating Trammell Bowden/Extender: Cathie Olden in Treatment: 52 Wound Status Wound Number: 5 Primary Diabetic Wound/Ulcer of the Lower Extremity Etiology: Wound Location: Left Toe Great Wound Open Wounding Event: Gradually Appeared Status: Date Acquired: 06/11/2016 Comorbid Anemia, Lymphedema, Congestive Heart Weeks Of Treatment: 52 History: Failure, Hypertension, Peripheral Venous Clustered Wound: No Disease, Type II Diabetes, Neuropathy Pending Amputation On Presentation Photos Photo Uploaded By: Roger Shelter on 09/26/2017 16:17:58 Wound Measurements Length: (cm) 0.6 Width: (cm) 0.6 Depth: (cm) 0.1 Area: (cm) 0.283 Volume: (cm) 0.028 % Reduction in Area: 98% % Reduction in Volume:  98% Epithelialization: None Tunneling: No Undermining: No Wound Description Classification: Grade 2 Wound Margin: Flat and Intact Exudate Amount: Medium Exudate Type: Serous Exudate Color: amber Foul Odor After Cleansing: No Slough/Fibrino Yes Wound Bed Granulation Amount: Large (67-100%) Exposed Structure Granulation Quality: Red Fascia Exposed: No Necrotic Amount: None Present (0%) Fat Layer (Subcutaneous Tissue) Exposed: No Tendon Exposed: No Muscle Exposed: No Joint Exposed: No Bone Exposed: No Periwound  Skin Texture Willert, Dwan E. ([id number]) Texture Color No Abnormalities Noted: No No Abnormalities Noted: No Callus: No Atrophie Blanche: No Crepitus: No Cyanosis: No Excoriation: No Ecchymosis: No Induration: No Erythema: No Rash: No Hemosiderin Staining: No Scarring: No Mottled: No Pallor: No Moisture Rubor: No No Abnormalities Noted: No Dry / Scaly: No Temperature / Pain Maceration: No Temperature: No Abnormality Wound Preparation Ulcer Cleansing: Rinsed/Irrigated with Saline Topical Anesthetic Applied: Other: lidocaine 4%, Treatment Notes Wound #5 (Left Toe Great) 1. Cleansed with: Clean wound with Normal Saline 2. Anesthetic Topical Lidocaine 4% cream to wound bed prior to debridement 4. Dressing Applied: Other dressing (specify in notes) 5. Secondary Dressing Applied ABD Pad Kerlix/Conform Notes silvercell Electronic Signature(s) Signed: 09/26/2017 10:41:08 AM By: Roger Shelter Entered By: Roger Shelter on 09/26/2017 09:09:20 Dwight, [name] ([id number]) -------------------------------------------------------------------------------- Vitals Details Patient Name: Yamin, Schylar E. Date of Service: 09/26/2017 8:45 AM Medical Record Number: [id number] Patient Account Number: [id number] Date of Birth/Sex: [birth year] (67 y.o. M) Treating RN: Roger Shelter Primary Care Anjanae Woehrle: Lamonte Sakai Other Clinician: Referring Merrick Maggio: Lamonte Sakai Treating Illya Gienger/Extender: Cathie Olden in Treatment: 40 Vital Signs Time Taken: 08:59 Temperature (F): 98.2 Height (in): 69 Pulse (bpm): 64 Weight (lbs): 168 Respiratory Rate (breaths/min): 16 Body Mass Index (BMI): 24.8 Blood Pressure (mmHg): 167/78 Reference Range: 80 - 120 mg / dl Electronic Signature(s) Signed: 09/26/2017 10:41:08 AM By: Roger Shelter Entered By: Roger Shelter on 09/26/2017 09:00:15

## 2017-09-28 NOTE — Progress Notes (Signed)
[name], [name] ([id number]) Visit Report for 09/26/2017 Chief Complaint Document Details Patient Name: [name], [name] Date of Service: 09/26/2017 8:45 AM Medical Record Number: [id number] Patient Account Number: [id number] Date of Birth/Sex: 14-Jul-1950 (67 y.o. M) Treating RN: Ahmed Prima Primary Care Provider: Lamonte Sakai Other Clinician: Referring Provider: Lamonte Sakai Treating Provider/Extender: Cathie Olden in Treatment: 38 Information Obtained from: Patient Chief Complaint Patient presents for treatment of an open diabetic ulcer to both feet Electronic Signature(s) Signed: 09/26/2017 12:41:14 PM By: Lawanda Cousins Entered By: Lawanda Cousins on 09/26/2017 12:41:14 Godsil, [name] ([id number]) -------------------------------------------------------------------------------- HPI Details Patient Name: [name], [name] Date of Service: 09/26/2017 8:45 AM Medical Record Number: [id number] Patient Account Number: [id number] Date of Birth/Sex: 07/05/1950 (67 y.o. M) Treating RN: Ahmed Prima Primary Care Provider: Lamonte Sakai Other Clinician: Referring Provider: Lamonte Sakai Treating Provider/Extender: Cathie Olden in Treatment: 60 History of Present Illness Location: bilateral feet ulceration on the toes Quality: Patient reports experiencing a dull pain to affected area(s). Severity: Patient states wound are getting better Duration: Patient has had the wound for > 3 months prior to seeking treatment at the wound center Timing: Pain in wound is constant (hurts all the time) Context: The wound would happen gradually Modifying Factors: Other treatment(s) tried include:treatment for lymphedema [name] is seen by the podiatrist Dr. Caryl Comes Associated Signs [name] Symptoms: Patient reports having increase swelling. HPI Description: 67 year old patient here to see as for bilateral feet ulceration to on his left first [name] second toe [name] 2 on his right first [name] second toe, which  she's had for about 4 months. 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 Donella Stade. The patient is also undergoing workup by medical oncology for a intra-abdominal lymphadenopathy. Past medical history significant for CHF, diabetes mellitus, hypertension, varicose veins with lymphedema [name] squamous cell cancer of the skin of the buttocks. He is also status post appendectomy, inguinal lymph node biopsy, rectal biopsy [name] rectal examination under anesthesia. he currently smokes cigarettes about half packet a day. In March of this year he was seen by Dr. Hortencia Pilar, for evaluation of bilateral varicose veins [name] besides wearing compression stockings he had recommended laser ablation of the right [name] left great saphenous veins to eleviate the symptoms [name] complications of severe superficial venous reflux disease. He also recommended lymphedema pumps for better control of his lymphedema. The patient recently has had on 08/23/2016, right greater saphenous vein ablation with the laser energy Earlier lower extremity venous reflux examination done on 05/08/2016 showed no DVT or SVT both lower legs but incompetence of bilateral great saphenous veins was present. A lower arterial study was also done [name] there was no significant right lower [name] left lower extremity problems based on a normal toe brachial index bilaterally [name] the ABI was 1.21 the left [name] 1.23 on the right. His post ablation venous duplex examination showed successful ablation of the right GS vein with thrombus formation 2 below the right saphenofemoral junction. The deep system was patent without evidence of thrombosis [name] this was done on 08/30/2016. the patient also has a squamous cell cancer of the skin of the buttock [name] is recently undergone radiation therapy for this prior to excisional  surgery. Addendum: regarding his x-rays done today [name] x-ray of the left foot -- IMPRESSION: No objective evidence of osteomyelitis. There are soft tissue changes which  may reflect cellulitis. X-ray of the right foot -- IMPRESSION:Findings compatible with cellulitis of the toes. No objective evidence of osteomyelitis is observed. 10/01/16 on evaluation today patient's wounds appeared to be doing some better. I did review the x-rays as well which showed no evidence of osteomyelitis although there was evidence on x-ray of cellulitis. He fortunately is not having any discomfort although he continues to have some swelling. He does not remember being on any antibiotics recently. 10/15/16 on evaluation today patient's wounds overall appear to be doing better although he does have a new location noted on the left foot. Fortunately he is not having significant pain. It almost has the appearance that something is rubbing on the end of his toes but he wears the open toe shoes [name] according to what he is telling me never wears anything that would rub on his foot. There is no evidence of infection [name] specifically no evidence of a fungal infection 10/22/16 On evaluation today patient's wounds appeared to be doing better compared to last week in regard to his bilateral [name], [name] ([id number]) lower extremities. Fortunately I happy with how things are progressing although he still has ulcers I feel like that he is improving [name] appropriate manner. 11/12/16 on evaluation today patient appears to be doing well in regard to his bilateral feet [name] the respective wounds. We have been using surrounding her dressings along with an antifungal cream which seems to be doing very well. He has no bilateral dysfunction noticed that the rituals are weight loss at this point. He also has no nausea or vomiting a note purulent discharge. He did see Ve[facility] [name] vascular today [name] he tells me that they told him he could have surgery  for his venous stasis but they did not feel like it was worth it in his words. Fortunately patient's wounds do appear to be getting sneakily better. 11/26/2016 -- he says he is going to have some surgery during this week at [facility] for possibly a colon resection. 12/31/2016 -- the patient has been noncompliant with his smoking [name] I'm not sure whether he is also started drinking again. He continues to be very nonchalant about his care 01/14/2017 -- the patient's HandP has been reviewed well [name] I understand he is being compliant with trying to give up smoking [name] his local dressing changes. He does not have any surgical options of 4 to him by his vascular surgeons.he was last seen in early August by Dr. Hortencia Pilar who recommended compression stockings,[name] possibly lymph pumps in 2-3 months after doing a review ultrasound. 01/28/2017 - the patient did not have any fresh complaints but on examination I noted a large lacerated wound on the plantar aspect of his right fourth toe which had a lot of necrotic debris [name] it probes down to bone. 02/07/2017 -- x-ray of the right foot -- IMPRESSION: Soft tissue swelling about the first through fourth toes consistent with cellulitis. New destructive change in the tuft of the distal phalanx of the great toe is consistent with osteomyelitis. 02/14/2017 -- the patient's MRI is pending this coming Monday [name] he still continues to smoke. We have again gone over off loading of his wounds in great detail [name] he says he's been compliant. 02/21/2017 -- MR of the right foot -- IMPRESSION: 1. Soft tissue ulcer at the tip of the first, second [name] third toe knows. Cortical irregularity [name] bone marrow edema in the first distal phalanx most concerning for osteomyelitis. Mild marrow edema  in the second [name] third distal phalanx without definite cortical destruction which may reflect early osteomyelitis versus reactive marrow edema. 2. Soft tissue edema surrounding  the first phalanx most consistent with cellulitis. the patient was also recently evaluated by his medical oncologist Dr. 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 She is treating him with weekly Procrit. She is also keeping intra-abdominal lymphadenopathy [name] right lower lobe lung nodule under observation. 04/04/2017 -- he was seen by Dr. Adrian Prows on 03/25/2017 -- after review he empirically put him on ciprofloxacin [name] doxycycline as they have good bone penetration [name] good bioavailability [name] it will cover the usual pathogens [name] diabetic foot osteomyelitis. He will check inflammatory markers [name] plan a 74-64 week old records. C-reactive protein was 0.3 [name] the ESR was 72 04/18/17 on evaluation today patient appears to be doing about the same in regard to his lower extremity wounds bilaterally. He has continued to use the antifungal cream which does seem to be beneficial. Nonetheless the ulcers do seem to in some areas be epithelial eyes over [name] in other areas are still open. He is having no significant discomfort. 04/25/17-he is here in follow-up evaluation for multiple ulcerations to multiple toes bilaterally. He states he did see Dr. Ola Spurr again last week [name] continues antibiotic therapy. He is voicing no complaints or concerns, will continue with current treatment plan will possibility of adding compression therapy next week after an additional week of treatment/lotions to BLE prescribed by Dr Ola Spurr 05/02/17 he is here in follow up for for multiple ulcers to multiple toes bilaterally. we will stop using antifungal cream [name] will continue with silvercel [name] follow up next week 05/09/17-he is here in follow-up for multiple ulcerations to multiple toes bilaterally. There is improvement in appearance. He has not completely stopped using antifungal cream, but admits he has not using it between the toes. He has an  appointment with Dr. Ola Spurr on 2/11, continues on doxycycline [name] Cipro. It has been 5 weeks of antibiotic therapy, we will order plain film xray to evaluate for osteomyelitis next week, prior to follow up with ID. Will continue with silvercel [name] follow up next week 05/16/17-he is here in follow-up evaluation for multiple ulcerations to multiple toes bilaterally [name] new wound to the right posterior heel. There is essentially no change in appearance, deteriorating measurements; he has a history of waxing [name] waning measurements. He admits that he continues to apply moisturizer/cream/ointment to his toes despite weekly reminders to only apply silvercel to his toes. He states that he thinks the surgical shoe contributed to the superficial ulcer to his posterior Brackney, Dontre E. ([id number]) heel, he is unable to articulate if this was an area of dry cracked skin as he has a similar area to the left heel. He now is wearing open toed slippers. He has an appointment with Dr. Ola Spurr on 2/11. We have ordered x-rays for her bilateral feet; he was advised to obtain the x-rays today or tomorrow. He will follow-up next week 05/23/17-he is here in follow-up evaluation for multiple ulcerations to multiple toes bilaterally [name] the right posterior heel. There is improvement in maceration. He has been compliant [name] not applying any moisturizing agent to his toes. He has been using Lac-Hydrin for his lower extremities with improvement. He did not go to his appointment on Monday with Dr. Ola Spurr secondary to financial concerns. X-rays for her bilateral feet showed: LEFT FOOT with slight  erosion of the tuft of the distal phalanges of the left first [name] second toe suspicious for osteomyelitis, RIGHT FOOT with 1.erosion of the tufts of the distal phalanges of the right first second [name] possibly third toes consistent with osteomyelitis, 2 no definitive abnormality of the calcaneus is seen on the images obtained, 3.  Plantar calcaneal degenerative spur. We briefly discussed hyperbaric adjunctive therapy for treatment of chronic refractory osteomyelitis. I do not find an a1c in EMR, will contact PCP for record, or order if needed. He has been encouraged to contact Dr Ola Spurr office regarding the follow-up appointment, encouraged him to inquire about payment plan. We will continue with same treatment plan [name] follow-up next week. He states he is still taking antibiotics [name] has "a lot" left. He states he has been taking them as directed, 2 pills twice daily. According to Dr. Blane Ohara office notes he was originally started on 12/17 for 4 weeks [name] extended on 1/14 for an additional 4 weeks. He should be done with his antibiotic therapy, he was advised to bring his bottles [name] to his next appointment, we will contact pharmacy. 05/30/17-he is here in follow-up evaluation for multiple ulcerations to multiple toes bilaterally [name] the right posterior heel. He is accompanied by his brother-in-law. Wounds are stable. He has yet to make up with Dr. Ola Spurr. We contacted his PCP, with no record of recent A1c we will draw an A1c. His brother-in-law states that he was taken off all of his diabetic medication secondary to kidney function. 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 The antibiotics were counted [name] have approximately 2 weeks left, although they should be complete. The pill organizer reveals missing days. We discussed the need for consistent medications, to have optimal benefit of medication. He has a cousin that lives with him [name] he will ask her to check his organizer daily. He has been advised to follow up with Dr Ola Spurr, [name] will go by the office today. He has been advised to quit smoking. 06/06/17-he is here in follow up evaluation. He has had to make an appointment with Dr. Ola Spurr. He did have  blood work obtained, a1c 5. He continues to take antibiotic therapy. Significant improvement in bilateral lower extremity edema with compression therapy. Essentially no change in ulcerations to toes. He states he is "going to try something different" [name] "let me know next week" if it works; he would not provide any additional information [name] was encouraged to follow our orders. We will follow up next week 06/13/17-he is here in follow-up evaluation. He has an appointment with Dr. Ola Spurr tomorrow morning. He states he purchased an ointment from [facility] [name] applied to his toes for 3 days, he does not remember the name of the ointment. There is improvement to his wounds, minimal maceration. He continues to take antibiotic therapy, this should have been completed last month. His brother-in-law who regularly accompanies his appointments was asked to take the bottles to the appointment tomorrow with Dr. Ola Spurr so he is aware. We will continue with 3 layer compression, [name] order OPEN TOE compression 20-30mmHg; we will apply compression stockings next week. He continues to smoke, smoked "2 cigarettes" last week 06/20/17 on evaluation today patient did receive his compression stockings which he has with him today for both lower extremities. With that being said he tells me at this point in time that he is very happy to have these he  really is not a big fan of the compression wraps that we have been utilizing although they have been of great benefit for him. Nonetheless at this point he does want to switch to the compression stockings. In my opinion as long as he is continuing with compression I'm okay with the stockings or the wraps. 06/27/17-he is here in follow-up evaluation for multiple ulcerations to his bilateral toes. There is some improvement in appearance. He is compliant in wearing his compression stockings with significant improvement in lower extremity edema. He saw Dr Ola Spurr on 3/8,  per his notes they would redraw ESR [name] CRP; plan to continue antibiotic therapy if these remain elevated. I do not see an ESR or CRP level in Epic. The patient continues to take antibiotics. 07/11/17-He is here in follow-up evaluation for multiple ulcerations to multiple toes bilaterally. He presents with complete epithelialization to the right third toe; there has been no deterioration. He continues on antibiotic therapy. He will follow-up next week 07/18/17-He is here in follow-up evaluation for multiple ulcerations to multiple toes bilaterally. He continues to make improvement. He continues on antibiotic therapy. He states he has been using something additional to our orders, he does not elaborate but states he will bring it in next week. 07/25/17-He is here in follow up evaluation for multiple ulcerations to bilateral toes. He is stable. He has completed antibiotic therapy. He admits to "filing" his toes after showers each evening, this is what he was referencing last week; he does not filing for the wounds. We will switch to [facility] [name] monitor for any improvement, he will follow-up next week 08/01/17-He is here in follow-up evaluation. He admits to "picking" at his toes after cleansing yesterday, leading to new areas of tissue loss on the bilateral second toe. There is improvement noted to the bilateral great toe. We will dress toes today [name] hope that they maintain until Monday where he will come in for a nurse visit. He has been advised, multiple times with expressed verbalization, to change the dressings to silvercel if the dressings get wet prior to [facility] appointment. LACY, TAGLIERI ([id number]) 08/08/17-He is here in follow-up evaluation for bilateral first [name] second toe ulcerations. There is significant improvement to all ulcerations since last visit. We will switch to [facility] to all wounds [name] he will continue with nurse visits on a Monday/Thursday schedule [name] follow-up  with me in 2 weeks. He continues to smoke, 1-3 cigarettes per day, [name] has been encouraged to not smoke until his next follow-up in 2 weeks. 08/15/17 on evaluation today patient's ulcers on his toes actually appear to be doing fairly well the right to ulcers may be a little bit more moist compared to the left I'm not really sure exactly why as the openings appear to be very small [name] I'm not seeing any evidence of anything significant as far as you lightest or otherwise. Nonetheless this may just be a small setback he's been doing very well with the [facility] Dressing 08/22/17-He is here in follow-up evaluation for ulcerations to his bilateral first [name] second toes, there is small/scant amount of drainage noted on today's dressing. He continues with Hydrofera Blue. He continues to smoke, 1 cigarette a day. Voices no complaint or concerns, compliant [name] compression therapy today. He'll follow-up next week 08/29/17-He is here in follow-up evaluation for ulcerations to his bilateral first [name] second toes, with a new wound to the left third toe. He states he cut himself while cutting  the toenail. He presents today with more maceration [name] increased measurements; waxing [name] waning measurements [name] moisture has been an ongoing issue. He continues to smoke a proximally 1 cigarette per day, but states he has not smoked since Sunday. We will initiate medihoney daily [name] evaluate next week. He presents today without compression stockings 09/05/17-He is here in follow-up evaluation for ulcerations to bilateral first [name] second [name] left third toe. He did not pick up the medihoney [name] therefore has not been changing his dressing. He admits to inconsistent where of compression stockings, admits to pain to his bilateral lower extremities with unilateral edema (right greater than left). The ulcerations to multiple toes are more macerated [name] larger in size than last week. He is wearing compression therapy today. He  admits to an overall feeling of weakness [name] fatigue [name] has been encouraged to contact his PCP for evaluation; he has a known history of anemia [name] intraabdominal lymphadenopathy. We will return to silvercel [name] he will follow up next week 09/19/17-He is here in follow-up evaluation for ulcerations to bilateral first [name] second toe.he was unable to make last week's appointment. The culture that was obtained on 5/30 grew clindamycin sensitive MRSA; he was initiated on 6/4 but did not start it until 6/7. There is significant improvement in all wounds, healing to left second [name] third toe. He also admits to having no alcohol or smoking for the last 6 days. We will continue with same treatment plan I will extend the clindamycin for an additional 10 days [name] he will follow-up next week. 09/26/17- He is here in follow evaluation for multiple ulcerations to multiple toes bilaterally. He continues to be non-compliant with dressing, compression therapy. He states he is taking the antibiotics as prescribed, although his recall is inconsistent. There is noted deterioration in all ulcers. We will continue same treatment plan, antibiotic therapy [name] he will follow up next week. 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 This information is obtained from the medical record, as he cannot articulate specifics to his diagnosis or treatment plan. Electronic Signature(s) Signed: 09/26/2017 12:47:29 PM By: Lawanda Cousins Previous Signature: 09/26/2017 12:44:23 PM Version By: Lawanda Cousins Entered By: Lawanda Cousins on 09/26/2017 12:47:29 Chavana, [name] ([id number]) -------------------------------------------------------------------------------- Physician Orders Details Patient Name: Wiedel, [name] Date of Service: 09/26/2017 8:45 AM Medical  Record Number: [id number] Patient Account Number: [id number] Date of Birth/Sex: 14-May-1950 (67 y.o. M) Treating RN: Ahmed Prima Primary Care Provider: Lamonte Sakai Other Clinician: Referring Provider: Lamonte Sakai Treating Provider/Extender: Cathie Olden in Treatment: 61 Verbal / Phone Orders: Yes Clinician: Carolyne Fiscal, Debi Read Back [name] Verified: Yes Diagnosis Coding Wound Cleansing Wound #1 Right Toe Great o Clean wound with Normal Saline. Wound #14 Left Toe Second o Clean wound with Normal Saline. Wound #15 Left Toe Fourth o Clean wound with Normal Saline. Wound #2 Right Toe Second o Clean wound with Normal Saline. Wound #5 Left Toe Great o Clean wound with Normal Saline. Anesthetic (add to Medication List) Wound #1 Right Toe Great o Topical Lidocaine 4% cream applied to wound bed prior to debridement (In Clinic Only). Wound #14 Left Toe Second o Topical Lidocaine 4% cream applied to wound bed prior to debridement (In Clinic Only). Wound #15 Left Toe Fourth o Topical Lidocaine 4% cream applied to wound bed prior to  debridement (In Clinic Only). Wound #2 Right Toe Second o Topical Lidocaine 4% cream applied to wound bed prior to debridement (In Clinic Only). Wound #5 Left Toe Great o Topical Lidocaine 4% cream applied to wound bed prior to debridement (In Clinic Only). Primary Wound Dressing Wound #1 Right Toe Great o Silver Alginate Wound #14 Left Toe Second o Silver Alginate Wound #15 Left Toe Fourth o Silver Alginate Wound #2 Right Toe Second o Silver Alginate Swinford, Arjuna E. ([id number]) Wound #5 Left Toe Great o Silver Alginate Secondary Dressing Wound #1 Right Toe Great o ABD pad o Dry Gauze o Conform/Kerlix Wound #14 Left Toe Second o ABD pad o Dry Gauze o Conform/Kerlix Wound #15 Left Toe Fourth o ABD pad o Dry Gauze o Conform/Kerlix Wound #2 Right Toe Second o ABD pad o Dry Gauze o  Conform/Kerlix Wound #5 Left Toe Great o ABD pad o Dry Gauze o Conform/Kerlix Dressing Change Frequency Wound #1 Right Toe Great o Change dressing every day. Wound #14 Left Toe Second o Change dressing every day. Wound #15 Left Toe Fourth o Change dressing every day. Wound #2 Right Toe Second o Change dressing every day. Wound #5 Left Toe Great o Change dressing every day. Follow-up Appointments Wound #1 Right Toe Great o Return Appointment in 1 week. Wound #14 Left Toe Second o Return Appointment in 1 week. Wound #15 Left Toe Fourth o Return Appointment in 1 week. ZEBULIN, SIEGEL ([id number]) Wound #2 Right Toe Second o Return Appointment in 1 week. Wound #5 Left Toe Great o Return Appointment in 1 week. Edema Control Wound #1 Right Toe Great o Patient to wear own compression stockings Wound #14 Left Toe Second o Patient to wear own compression stockings Wound #15 Left Toe Fourth o Patient to wear own compression stockings Wound #2 Right Toe Second o Patient to wear own compression stockings Wound #5 Left Toe Great o Patient to wear own compression stockings Additional Orders / Instructions Wound #14 Left Toe Second o Stop Smoking o Increase protein intake. Wound #15 Left Toe Fourth o Stop Smoking o Increase protein intake. Patient Medications Allergies: No Known Drug Allergies Notifications Medication Indication Start End lidocaine DOSE 1 - topical 4 % cream - 1 cream topical Electronic Signature(s) Signed: 09/26/2017 4:29:22 PM By: Alric Quan Signed: 09/26/2017 5:32:34 PM By: Lawanda Cousins Entered By: Alric Quan on 09/26/2017 09:35:48 Esther, [name] ([id number]) -------------------------------------------------------------------------------- Prescription 09/26/2017 Patient Name: [name] Provider: Lawanda Cousins NP Date of Birth: February 24, 1951 NPI#: [id number] Sex: Jerilynn Mages DEA#: [id number] Phone #:  [phone] License #: Patient Address: [facility] [place], [place] [address] [address] [place], [place] [address] [phone] Allergies No Known Drug Allergies Medication Medication: Route: Strength: Form: lidocaine topical 4% cream Class: TOPICAL LOCAL ANESTHETICS Dose: Frequency / Time: Indication: 1 1 cream topical Number of Refills: Number of Units: 0 Generic Substitution: Start Date: End Date: Administered at [place]: Yes Time Administered: Time Discontinued: Note to Pharmacy: Signature(s): Date(s): Electronic Signature(s) Signed: 09/26/2017 4:29:22 PM By: Alric Quan Signed: 09/26/2017 5:32:34 PM By: Lawanda Cousins Entered By: Alric Quan on 09/26/2017 09:35:50 Richoux, Javeion E. ([id number]) Mcconaughey, Arlester E. ([id number]) --------------------------------------------------------------------------------  Problem List Details Patient Name: Espey, Pao E. Date of Service: 09/26/2017 8:45 AM Medical Record Number: [id number] Patient Account Number: [id number] Date of Birth/Sex: 1951-01-27 (67 y.o. M) Treating RN: Ahmed Prima Primary Care Provider: Lamonte Sakai Other Clinician: Referring Provider:  Humphrey Rolls, Neelam Treating Provider/Extender: Cathie Olden in Treatment: 52 Active Problems ICD-10 Evaluated Encounter Code Description Active Date Today Diagnosis E11.621 Type 2 diabetes mellitus with foot ulcer 09/21/2016 No Yes I87.313 Chronic venous hypertension (idiopathic) with ulcer of 09/21/2016 No Yes bilateral lower extremity I89.0 Lymphedema, not elsewhere classified 09/21/2016 No Yes L97.522 Non-pressure chronic ulcer of other part of left foot with fat 09/21/2016 No Yes layer exposed L97.512 Non-pressure chronic ulcer of other part of right foot with fat 09/21/2016 No Yes layer exposed F17.218 Nicotine dependence, cigarettes,  with other nicotine-induced 09/21/2016 No Yes disorders F10.19 Alcohol abuse with unspecified alcohol-induced disorder 09/21/2016 No Yes M86.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, right ankle [name] foot 02/21/2017 No Yes Inactive Problems Resolved Problems Electronic Signature(s) KENDLE, ERKER ([id number]) Signed: 09/26/2017 12:40:49 PM By: Lawanda Cousins Previous Signature: 09/26/2017 12:24:02 PM Version By: Lawanda Cousins Entered By: Lawanda Cousins on 09/26/2017 12:40:49 Kolker, [name] ([id number]) -------------------------------------------------------------------------------- Progress Note Details Patient Name: Hallstrom, [name] Date of Service: 09/26/2017 8:45 AM Medical Record Number: [id number] Patient Account Number: [id number] Date of Birth/Sex: 1950-08-11 (67 y.o. M) Treating RN: Ahmed Prima Primary Care Provider: Lamonte Sakai Other Clinician: Referring Provider: Lamonte Sakai Treating Provider/Extender: Cathie Olden in Treatment: 35 Subjective Chief Complaint Information obtained from Patient Patient presents for treatment of an open diabetic ulcer to both feet History of Present Illness (HPI) The following HPI elements were documented for the patient's wound: Location: bilateral feet ulceration on the toes Quality: Patient reports experiencing a dull pain to affected area(s). Severity: Patient states wound are getting better Duration: Patient has had the wound for > 3 months prior to seeking treatment at the wound center Timing: Pain in wound is constant (hurts all the time) Context: The wound would happen gradually Modifying Factors: Other treatment(s) tried include:treatment for lymphedema [name] is seen by the podiatrist Dr. Caryl Comes Associated Signs [name] Symptoms: Patient reports having increase swelling. 67 year old patient here to see as for bilateral feet ulceration to on his left first [name] second toe [name] 2 on his right first [name] second toe, which she's had for about 4  months. 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 Donella Stade. The patient is also undergoing workup by medical oncology for a intra-abdominal lymphadenopathy. Past medical history significant for CHF, diabetes mellitus, hypertension, varicose veins with lymphedema [name] squamous cell cancer of the skin of the buttocks. He is also status post appendectomy, inguinal lymph node biopsy, rectal biopsy [name] rectal examination under anesthesia. he currently smokes cigarettes about half packet a day. In March of this year he was seen by Dr. Hortencia Pilar, for evaluation of bilateral varicose veins [name] besides wearing compression stockings he had recommended laser ablation of the right [name] left great saphenous veins to eleviate the symptoms [name] complications of severe superficial venous reflux disease. He also recommended lymphedema pumps for better control of his lymphedema. The patient recently has had on 08/23/2016, right greater saphenous vein ablation with the laser energy Earlier lower extremity venous reflux examination done on 05/08/2016 showed no DVT or SVT both lower legs but incompetence of bilateral great saphenous veins was present. A lower arterial study was also done [name] there was no significant right lower [name] left lower extremity problems based on a normal toe brachial index bilaterally [name] the ABI was 1.21 the left [name] 1.23 on the right. His post  ablation venous duplex examination showed successful ablation of the right GS vein with thrombus formation 2 below the right saphenofemoral junction. The deep system was patent without evidence of thrombosis [name] this was done on 08/30/2016. the patient also has a squamous cell cancer of the skin of the buttock [name] is recently undergone radiation therapy for this prior to excisional surgery. Addendum: regarding his  x-rays done today [name] x-ray of the left foot -- IMPRESSION: No objective evidence of osteomyelitis. There are soft tissue changes which may reflect cellulitis. X-ray of the right foot -- IMPRESSION:Findings compatible with cellulitis of the toes. No objective evidence of osteomyelitis is observed. 10/01/16 on evaluation today patient's wounds appeared to be doing some better. I did review the x-rays as well which showed no evidence of osteomyelitis although there was evidence on x-ray of cellulitis. He fortunately is not having any discomfort Montejano, Sheryl E. ([id number]) although he continues to have some swelling. He does not remember being on any antibiotics recently. 10/15/16 on evaluation today patient's wounds overall appear to be doing better although he does have a new location noted on the left foot. Fortunately he is not having significant pain. It almost has the appearance that something is rubbing on the end of his toes but he wears the open toe shoes [name] according to what he is telling me never wears anything that would rub on his foot. There is no evidence of infection [name] specifically no evidence of a fungal infection 10/22/16 On evaluation today patient's wounds appeared to be doing better compared to last week in regard to his bilateral lower extremities. Fortunately I happy with how things are progressing although he still has ulcers I feel like that he is improving [name] appropriate manner. 11/12/16 on evaluation today patient appears to be doing well in regard to his bilateral feet [name] the respective wounds. We have been using surrounding her dressings along with an antifungal cream which seems to be doing very well. He has no bilateral dysfunction noticed that the rituals are weight loss at this point. He also has no nausea or vomiting a note purulent discharge. He did see Vein [name] vascular today [name] he tells me that they told him he could have surgery for his venous stasis but they  did not feel like it was worth it in his words. Fortunately patient's wounds do appear to be getting sneakily better. 11/26/2016 -- he says he is going to have some surgery during this week at [facility] for possibly a colon resection. 12/31/2016 -- the patient has been noncompliant with his smoking [name] I'm not sure whether he is also started drinking again. He continues to be very nonchalant about his care 01/14/2017 -- the patient's HandP has been reviewed well [name] I understand he is being compliant with trying to give up smoking [name] his local dressing changes. He does not have any surgical options of 4 to him by his vascular surgeons.he was last seen in early August by Dr. Hortencia Pilar who recommended compression stockings,[name] possibly lymph pumps in 2-3 months after doing a review ultrasound. 01/28/2017 - the patient did not have any fresh complaints but on examination I noted a large lacerated wound on the plantar aspect of his right fourth toe which had a lot of necrotic debris [name] it probes down to bone. 02/07/2017 -- x-ray of the right foot -- IMPRESSION: Soft tissue swelling about the first through fourth toes consistent with cellulitis. New destructive change in the tuft of  the distal phalanx of the great toe is consistent with osteomyelitis. 02/14/2017 -- the patient's MRI is pending this coming Monday [name] he still continues to smoke. We have again gone over off loading of his wounds in great detail [name] he says he's been compliant. 02/21/2017 -- MR of the right foot -- IMPRESSION: 1. Soft tissue ulcer at the tip of the first, second [name] third toe knows. Cortical irregularity [name] bone marrow edema in the first distal phalanx most concerning for osteomyelitis. Mild marrow edema in the second [name] third distal phalanx without definite cortical destruction which may reflect early osteomyelitis versus reactive marrow edema. 2. Soft tissue edema surrounding the first phalanx most  consistent with cellulitis. the patient was also recently evaluated by his medical oncologist Dr. 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 She is treating him with weekly Procrit. She is also keeping intra-abdominal lymphadenopathy [name] right lower lobe lung nodule under observation. 04/04/2017 -- he was seen by Dr. Adrian Prows on 03/25/2017 -- after review he empirically put him on ciprofloxacin [name] doxycycline as they have good bone penetration [name] good bioavailability [name] it will cover the usual pathogens [name] diabetic foot osteomyelitis. He will check inflammatory markers [name] plan a 66-44 week old records. C-reactive protein was 0.3 [name] the ESR was 72 04/18/17 on evaluation today patient appears to be doing about the same in regard to his lower extremity wounds bilaterally. He has continued to use the antifungal cream which does seem to be beneficial. Nonetheless the ulcers do seem to in some areas be epithelial eyes over [name] in other areas are still open. He is having no significant discomfort. 04/25/17-he is here in follow-up evaluation for multiple ulcerations to multiple toes bilaterally. He states he did see Dr. Ola Spurr again last week [name] continues antibiotic therapy. He is voicing no complaints or concerns, will continue with current treatment plan will possibility of adding compression therapy next week after an additional week of treatment/lotions to BLE prescribed by Dr Ola Spurr 05/02/17 he is here in follow up for for multiple ulcers to multiple toes bilaterally. we will stop using antifungal cream [name] will continue with silvercel [name] follow up next week Essman, Javan E. ([id number]) 05/09/17-he is here in follow-up for multiple ulcerations to multiple toes bilaterally. There is improvement in appearance. He has not completely stopped using antifungal cream, but admits he has not using it between the toes. He  has an appointment with Dr. Ola Spurr on 2/11, continues on doxycycline [name] Cipro. It has been 5 weeks of antibiotic therapy, we will order plain film xray to evaluate for osteomyelitis next week, prior to follow up with ID. Will continue with silvercel [name] follow up next week 05/16/17-he is here in follow-up evaluation for multiple ulcerations to multiple toes bilaterally [name] new wound to the right posterior heel. There is essentially no change in appearance, deteriorating measurements; he has a history of waxing [name] waning measurements. He admits that he continues to apply moisturizer/cream/ointment to his toes despite weekly reminders to only apply silvercel to his toes. He states that he thinks the surgical shoe contributed to the superficial ulcer to his posterior heel, he is unable to articulate if this was an area of dry cracked skin as he has a similar area to the left heel. He now is wearing open toed slippers. He has an appointment with Dr. Ola Spurr on 2/11. We have ordered x-rays for her bilateral feet;  he was advised to obtain the x-rays today or tomorrow. He will follow-up next week 05/23/17-he is here in follow-up evaluation for multiple ulcerations to multiple toes bilaterally [name] the right posterior heel. There is improvement in maceration. He has been compliant [name] not applying any moisturizing agent to his toes. He has been using Lac-Hydrin for his lower extremities with improvement. He did not go to his appointment on Monday with Dr. Ola Spurr secondary to financial concerns. X-rays for her bilateral feet showed: LEFT FOOT with slight erosion of the tuft of the distal phalanges of the left first [name] second toe suspicious for osteomyelitis, RIGHT FOOT with 1.erosion of the tufts of the distal phalanges of the right first second [name] possibly third toes consistent with osteomyelitis, 2 no definitive abnormality of the calcaneus is seen on the images obtained, 3. Plantar calcaneal  degenerative spur. We briefly discussed hyperbaric adjunctive therapy for treatment of chronic refractory osteomyelitis. I do not find an a1c in EMR, will contact PCP for record, or order if needed. He has been encouraged to contact Dr Ola Spurr office regarding the follow-up appointment, encouraged him to inquire about payment plan. We will continue with same treatment plan [name] follow-up next week. He states he is still taking antibiotics [name] has "a lot" left. He states he has been taking them as directed, 2 pills twice daily. According to Dr. Blane Ohara office notes he was originally started on 12/17 for 4 weeks [name] extended on 1/14 for an additional 4 weeks. He should be done with his antibiotic therapy, he was advised to bring his bottles [name] to his next appointment, we will contact pharmacy. 05/30/17-he is here in follow-up evaluation for multiple ulcerations to multiple toes bilaterally [name] the right posterior heel. He is accompanied by his brother-in-law. Wounds are stable. He has yet to make up with Dr. Ola Spurr. We contacted his PCP, with no record of recent A1c we will draw an A1c. His brother-in-law states that he was taken off all of his diabetic medication secondary to kidney function. 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 The antibiotics were counted [name] have approximately 2 weeks left, although they should be complete. The pill organizer reveals missing days. We discussed the need for consistent medications, to have optimal benefit of medication. He has a cousin that lives with him [name] he will ask her to check his organizer daily. He has been advised to follow up with Dr Ola Spurr, [name] will go by the office today. He has been advised to quit smoking. 06/06/17-he is here in follow up evaluation. He has had to make an appointment with Dr. Ola Spurr. He did have blood  work obtained, a1c 5. He continues to take antibiotic therapy. Significant improvement in bilateral lower extremity edema with compression therapy. Essentially no change in ulcerations to toes. He states he is "going to try something different" [name] "let me know next week" if it works; he would not provide any additional information [name] was encouraged to follow our orders. We will follow up next week 06/13/17-he is here in follow-up evaluation. He has an appointment with Dr. Ola Spurr tomorrow morning. He states he purchased an ointment from [facility] [name] applied to his toes for 3 days, he does not remember the name of the ointment. There is improvement to his wounds, minimal maceration. He continues to take antibiotic therapy, this should have been completed last month. His brother-in-law who regularly accompanies his  appointments was asked to take the bottles to the appointment tomorrow with Dr. Ola Spurr so he is aware. We will continue with 3 layer compression, [name] order OPEN TOE compression 20-30mmHg; we will apply compression stockings next week. He continues to smoke, smoked "2 cigarettes" last week 06/20/17 on evaluation today patient did receive his compression stockings which he has with him today for both lower extremities. With that being said he tells me at this point in time that he is very happy to have these he really is not a big fan of the compression wraps that we have been utilizing although they have been of great benefit for him. Nonetheless at this point he does want to switch to the compression stockings. In my opinion as long as he is continuing with compression I'm okay with the stockings or the wraps. 06/27/17-he is here in follow-up evaluation for multiple ulcerations to his bilateral toes. There is some improvement in appearance. He is compliant in wearing his compression stockings with significant improvement in lower extremity edema. He saw Dr Ola Spurr on 3/8, per his  notes they would redraw ESR [name] CRP; plan to continue antibiotic therapy if these remain elevated. I do not see an ESR or CRP level in Epic. The patient continues to take antibiotics. 07/11/17-He is here in follow-up evaluation for multiple ulcerations to multiple toes bilaterally. He presents with complete epithelialization to the right third toe; there has been no deterioration. He continues on antibiotic therapy. He will follow-up next week 07/18/17-He is here in follow-up evaluation for multiple ulcerations to multiple toes bilaterally. He continues to make improvement. He continues on antibiotic therapy. He states he has been using something additional to our orders, he does Kadow, Johnthan E. ([id number]) not elaborate but states he will bring it in next week. 07/25/17-He is here in follow up evaluation for multiple ulcerations to bilateral toes. He is stable. He has completed antibiotic therapy. He admits to "filing" his toes after showers each evening, this is what he was referencing last week; he does not filing for the wounds. We will switch to [facility] [name] monitor for any improvement, he will follow-up next week 08/01/17-He is here in follow-up evaluation. He admits to "picking" at his toes after cleansing yesterday, leading to new areas of tissue loss on the bilateral second toe. There is improvement noted to the bilateral great toe. We will dress toes today [name] hope that they maintain until Monday where he will come in for a nurse visit. He has been advised, multiple times with expressed verbalization, to change the dressings to silvercel if the dressings get wet prior to [facility] appointment. 08/08/17-He is here in follow-up evaluation for bilateral first [name] second toe ulcerations. There is significant improvement to all ulcerations since last visit. We will switch to [facility] to all wounds [name] he will continue with nurse visits on a Monday/Thursday schedule [name] follow-up with me in  2 weeks. He continues to smoke, 1-3 cigarettes per day, [name] has been encouraged to not smoke until his next follow-up in 2 weeks. 08/15/17 on evaluation today patient's ulcers on his toes actually appear to be doing fairly well the right to ulcers may be a little bit more moist compared to the left I'm not really sure exactly why as the openings appear to be very small [name] I'm not seeing any evidence of anything significant as far as you lightest or otherwise. Nonetheless this may just be a small setback he's been doing very well  with the [facility] Dressing 08/22/17-He is here in follow-up evaluation for ulcerations to his bilateral first [name] second toes, there is small/scant amount of drainage noted on today's dressing. He continues with Hydrofera Blue. He continues to smoke, 1 cigarette a day. Voices no complaint or concerns, compliant [name] compression therapy today. He'll follow-up next week 08/29/17-He is here in follow-up evaluation for ulcerations to his bilateral first [name] second toes, with a new wound to the left third toe. He states he cut himself while cutting the toenail. He presents today with more maceration [name] increased measurements; waxing [name] waning measurements [name] moisture has been an ongoing issue. He continues to smoke a proximally 1 cigarette per day, but states he has not smoked since Sunday. We will initiate medihoney daily [name] evaluate next week. He presents today without compression stockings 09/05/17-He is here in follow-up evaluation for ulcerations to bilateral first [name] second [name] left third toe. He did not pick up the medihoney [name] therefore has not been changing his dressing. He admits to inconsistent where of compression stockings, admits to pain to his bilateral lower extremities with unilateral edema (right greater than left). The ulcerations to multiple toes are more macerated [name] larger in size than last week. He is wearing compression therapy today. He admits to  an overall feeling of weakness [name] fatigue [name] has been encouraged to contact his PCP for evaluation; he has a known history of anemia [name] intraabdominal lymphadenopathy. We will return to silvercel [name] he will follow up next week 09/19/17-He is here in follow-up evaluation for ulcerations to bilateral first [name] second toe.he was unable to make last week's appointment. The culture that was obtained on 5/30 grew clindamycin sensitive MRSA; he was initiated on 6/4 but did not start it until 6/7. There is significant improvement in all wounds, healing to left second [name] third toe. He also admits to having no alcohol or smoking for the last 6 days. We will continue with same treatment plan I will extend the clindamycin for an additional 10 days [name] he will follow-up next week. 09/26/17- He is here in follow evaluation for multiple ulcerations to multiple toes bilaterally. He continues to be non-compliant with dressing, compression therapy. He states he is taking the antibiotics as prescribed, although his recall is inconsistent. There is noted deterioration in all ulcers. We will continue same treatment plan, antibiotic therapy [name] he will follow up next week. 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 This information is obtained from the medical record, as he cannot articulate specifics to his diagnosis or treatment plan. Patient History Information obtained from Patient. Social History Current every day smoker, Marital Status - Widowed, Alcohol Use - Daily - quit drinking about a week ago, Drug Use - No History, Caffeine Use - Moderate. 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 DHRUVAN, GULLION ([id number]) Objective Constitutional Vitals Time Taken: 8:59 AM, Height: 69 in, Weight: 168 lbs, BMI: 24.8, Temperature: 98.2 F, Pulse: 64 bpm, Respiratory Rate: 16 breaths/min, Blood Pressure: 167/78 mmHg. Integumentary (Hair, Skin) Wound #1 status is Open. Original cause of wound was Gradually Appeared. The wound is located on the Right Toe Great. The wound  measures 0.5cm length x 1.3cm width x 0.1cm depth; 0.511cm^2 area [name] 0.051cm^3 volume. There is no tunneling or undermining noted. There is a medium amount of serosanguineous drainage noted. The wound margin is indistinct [name] nonvisible. There is small (1-33%) red granulation within the wound bed. There is a small (1-33%) amount of necrotic tissue within the wound bed including Adherent Slough. The periwound skin appearance exhibited: Callus, Scarring. The periwound skin appearance did not exhibit: Crepitus, Excoriation, Induration, Rash, Dry/Scaly, Maceration, Atrophie Blanche, Cyanosis, Ecchymosis, Hemosiderin Staining, Mottled, Pallor, Rubor, Erythema. Periwound temperature was noted as No Abnormality. Wound #14 status is Open. Original cause of wound was Gradually Appeared. The wound is located on the Left Toe Second. The wound measures 1.5cm length x 2cm width x 0.1cm depth; 2.356cm^2 area [name] 0.236cm^3 volume. There is no tunneling or undermining noted. There is a large amount of serous drainage noted. The wound margin is distinct with the outline attached to the wound base. There is medium (34-66%) red granulation within the wound bed. There is a medium (34- 66%) amount of necrotic tissue within the wound bed including Adherent Slough. The periwound skin appearance exhibited: Maceration. Periwound temperature was noted as No Abnormality. The periwound has tenderness on palpation. Wound #15 status is Open. Original cause of wound was Gradually Appeared. The wound is  located on the Left Toe Third. The wound measures 0.4cm length x 0.3cm width x 0.1cm depth; 0.094cm^2 area [name] 0.009cm^3 volume. There is no tunneling or undermining noted. There is a large amount of serous drainage noted. The wound margin is distinct with the outline attached to the wound base. There is large (67-100%) pink granulation within the wound bed. There is a small (1-33%) amount of necrotic tissue within the wound bed including Adherent Slough. Periwound temperature was noted as No Abnormality. The periwound has tenderness on palpation. Wound #2 status is Open. Original cause of wound was Gradually Appeared. The wound is located on the Right Toe Second. The wound measures 0.5cm length x 0.6cm width x 0.1cm depth; 0.236cm^2 area [name] 0.024cm^3 volume. There is no tunneling or undermining noted. There is a medium amount of serous drainage noted. The wound margin is flat [name] intact. There is large (67-100%) red granulation within the wound bed. There is no necrotic tissue within the wound bed. The periwound skin appearance exhibited: Callus. The periwound skin appearance did not exhibit: Crepitus, Excoriation, Induration, Rash, Scarring, Dry/Scaly, Maceration, Atrophie Blanche, Cyanosis, Ecchymosis, Hemosiderin Staining, Mottled, Pallor, Rubor, Erythema. Periwound temperature was noted as No Abnormality. Wound #5 status is Open. Original cause of wound was Gradually Appeared. The wound is located on the Left Toe Great. The wound measures 0.6cm length x 0.6cm width x 0.1cm depth; 0.283cm^2 area [name] 0.028cm^3 volume. There is no tunneling or undermining noted. There is a medium amount of serous drainage noted. The wound margin is flat [name] intact. There is large (67-100%) red granulation within the wound bed. There is no necrotic tissue within the wound bed. The periwound skin appearance did not exhibit: Callus, Crepitus, Excoriation, Induration, Rash, Scarring, Dry/Scaly, Maceration, Atrophie  Blanche, Cyanosis, Ecchymosis, Hemosiderin Staining, Mottled, Pallor, Rubor, Erythema. Periwound temperature was noted as No Abnormality. SEQUOIA, MINCEY ([id number]) Assessment Active Problems ICD-10 Type 2 diabetes mellitus with foot ulcer Chronic venous hypertension (idiopathic) with ulcer of bilateral lower extremity Lymphedema, not elsewhere classified Non-pressure chronic ulcer of other part of left foot with fat layer exposed Non-pressure chronic ulcer of other part of right foot with fat layer  exposed Nicotine dependence, cigarettes, with other nicotine-induced disorders Alcohol abuse with unspecified alcohol-induced disorder Chronic multifocal osteomyelitis, right ankle [name] foot Plan Wound Cleansing: Wound #1 Right Toe Great: Clean wound with Normal Saline. Wound #14 Left Toe Second: Clean wound with Normal Saline. Wound #15 Left Toe Fourth: Clean wound with Normal Saline. Wound #2 Right Toe Second: Clean wound with Normal Saline. Wound #5 Left Toe Great: Clean wound with Normal Saline. Anesthetic (add to Medication List): Wound #1 Right Toe Great: Topical Lidocaine 4% cream applied to wound bed prior to debridement (In Clinic Only). Wound #14 Left Toe Second: Topical Lidocaine 4% cream applied to wound bed prior to debridement (In Clinic Only). Wound #15 Left Toe Fourth: Topical Lidocaine 4% cream applied to wound bed prior to debridement (In Clinic Only). Wound #2 Right Toe Second: Topical Lidocaine 4% cream applied to wound bed prior to debridement (In Clinic Only). Wound #5 Left Toe Great: Topical Lidocaine 4% cream applied to wound bed prior to debridement (In Clinic Only). Primary Wound Dressing: Wound #1 Right Toe Great: Silver Alginate Wound #14 Left Toe Second: Silver Alginate Wound #15 Left Toe Fourth: Silver Alginate Wound #2 Right Toe Second: Silver Alginate Wound #5 Left Toe Great: Silver Alginate Secondary Dressing: Wound #1 Right Toe Great: ABD  pad Dry Gauze Garguilo, Hamzeh E. ([id number]) Conform/Kerlix Wound #14 Left Toe Second: ABD pad Dry Gauze Conform/Kerlix Wound #15 Left Toe Fourth: ABD pad Dry Gauze Conform/Kerlix Wound #2 Right Toe Second: ABD pad Dry Gauze Conform/Kerlix Wound #5 Left Toe Great: ABD pad Dry Gauze Conform/Kerlix Dressing Change Frequency: Wound #1 Right Toe Great: Change dressing every day. Wound #14 Left Toe Second: Change dressing every day. Wound #15 Left Toe Fourth: Change dressing every day. Wound #2 Right Toe Second: Change dressing every day. Wound #5 Left Toe Great: Change dressing every day. Follow-up Appointments: Wound #1 Right Toe Great: Return Appointment in 1 week. Wound #14 Left Toe Second: Return Appointment in 1 week. Wound #15 Left Toe Fourth: Return Appointment in 1 week. Wound #2 Right Toe Second: Return Appointment in 1 week. Wound #5 Left Toe Great: Return Appointment in 1 week. Edema Control: Wound #1 Right Toe Great: Patient to wear own compression stockings Wound #14 Left Toe Second: Patient to wear own compression stockings Wound #15 Left Toe Fourth: Patient to wear own compression stockings Wound #2 Right Toe Second: Patient to wear own compression stockings Wound #5 Left Toe Great: Patient to wear own compression stockings Additional Orders / Instructions: Wound #14 Left Toe Second: Stop Smoking Increase protein intake. Wound #15 Left Toe Fourth: Stop Smoking Increase protein intake. The following medication(s) was prescribed: lidocaine topical 4 % cream 1 1 cream topical was prescribed at facility RAHEIM, BEUTLER ([id number]) Electronic Signature(s) Signed: 09/26/2017 12:47:49 PM By: Lawanda Cousins Entered By: Lawanda Cousins on 09/26/2017 12:47:49 Blash, [name] ([id number]) -------------------------------------------------------------------------------- ROS/PFSH Details Patient Name: Bokhari, Domonique E. Date of Service: 09/26/2017 8:45  AM Medical Record Number: [id number] Patient Account Number: [id number] Date of Birth/Sex: Dec 12, 1950 (67 y.o. M) Treating RN: Ahmed Prima Primary Care Provider: Lamonte Sakai Other Clinician: Referring Provider: Lamonte Sakai Treating Provider/Extender: Cathie Olden in Treatment: 40 Information Obtained From Patient Wound History Do you currently have one or more open woundso Yes How many open wounds do you currently haveo 6 Approximately how long have you had your woundso 3 months How have you been treating your wound(s) until nowo ointment [name] bandage Has your wound(s) ever healed [name] then re-openedo  No Have you had any lab work done in the past montho No Have you tested positive for an antibiotic resistant organism (MRSA, VRE)o No Have you tested positive for osteomyelitis (bone infection)o No Have you had any tests for circulation on your legso Yes Who ordered the testo PCP Where was the test doneo [facility] Constitutional Symptoms (General Health) Complaints [name] Symptoms: Negative for: Fatigue; Fever; Chills Eyes Medical History: Negative for: Cataracts; Glaucoma; Optic Neuritis Ear/Nose/Mouth/Throat Medical History: Negative for: Chronic sinus problems/congestion; Middle ear problems Hematologic/Lymphatic Medical History: Positive for: Anemia; Lymphedema Negative for: Hemophilia; Human Immunodeficiency Virus; Sickle Cell Disease Respiratory Medical History: Negative for: Aspiration; Asthma; Chronic Obstructive Pulmonary Disease (COPD); Pneumothorax; Sleep Apnea; Tuberculosis Cardiovascular Medical History: Positive for: Congestive Heart Failure; Hypertension; Peripheral Venous Disease Negative for: Angina; Arrhythmia; Coronary Artery Disease; Deep Vein Thrombosis; Hypotension; Myocardial Infarction; Peripheral Arterial Disease; Phlebitis; Vasculitis Sotto, Gemini E. ([id number]) Gastrointestinal Medical History: Negative for: Cirrhosis ; Colitis; Crohnos;  Hepatitis A; Hepatitis B; Hepatitis C Endocrine Medical History: Positive for: Type II Diabetes Treated with: Oral agents Blood sugar tested every day: Yes Tested : QD Genitourinary Medical History: Negative for: End Stage Renal Disease Immunological Medical History: Negative for: Lupus Erythematosus; Raynaudos; Scleroderma Integumentary (Skin) Medical History: Negative for: History of Burn; History of pressure wounds Musculoskeletal Medical History: Negative for: Gout; Rheumatoid Arthritis; Osteoarthritis; Osteomyelitis Neurologic Medical History: Positive for: Neuropathy Negative for: Dementia; Quadriplegia; Paraplegia; Seizure Disorder Oncologic Medical History: Past Medical History Notes: 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 KINGSON, LOHMEYER ([id number]) Electronic Signature(s) Signed: 09/26/2017 4:29:22 PM By: Alric Quan Signed: 09/26/2017 5:32:34 PM By: Lawanda Cousins Entered By: Lawanda Cousins on 09/26/2017 12:44:47 Eskenazi, [name] ([id number]) -------------------------------------------------------------------------------- SuperBill Details Patient Name: Radliff, Raghav E. Date of Service: 09/26/2017 Medical Record Number: [id number] Patient Account Number: [id number] Date of Birth/Sex: 28-Mar-1951 (67 y.o. M) Treating RN: Ahmed Prima Primary Care Provider: Lamonte Sakai Other Clinician: Referring Provider: Lamonte Sakai Treating Provider/Extender: Cathie Olden in Treatment: 52 Diagnosis Coding ICD-10 Codes Code Description E11.621 Type 2 diabetes mellitus with foot ulcer I87.313 Chronic venous hypertension (idiopathic) with ulcer of bilateral lower extremity I89.0 Lymphedema, not elsewhere classified L97.522 Non-pressure chronic ulcer of other part of left foot with fat layer exposed L97.512 Non-pressure chronic ulcer of other part of right foot with fat layer exposed F17.218 Nicotine dependence, cigarettes, 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 abuse 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, right ankle [name] foot Facility Procedures CPT4 Code: 97948016 Description: 99214 - WOUND CARE VISIT-LEV 4 EST PT Modifier: Quantity: 1 Physician Procedures CPT4 Code Description: 5537482 70786 - WC PHYS LEVEL 3 - EST PT ICD-10 Diagnosis Description L97.522 Non-pressure chronic ulcer of other part of left foot with fat l L97.512 Non-pressure chronic ulcer of other part of right foot with fat I87.313 Chronic  venous hypertension (idiopathic) with ulcer of bilateral Modifier: ayer exposed layer exposed lower extremi Quantity: 1 ty Electronic Signature(s) Signed: 09/26/2017 12:48:13 PM By: Lawanda Cousins Entered By: Lawanda Cousins on  09/26/2017 12:48:13 

## 2017-10-03 ENCOUNTER — Encounter: Payer: Self-pay | Admitting: *Deleted

## 2017-10-03 ENCOUNTER — Ambulatory Visit
Admission: RE | Admit: 2017-10-03 | Discharge: 2017-10-03 | Disposition: A | Discharge status: Home / Self Care | Payer: Medicare HMO | Source: Ambulatory Visit | Attending: Radiation Oncology | Admitting: Radiation Oncology

## 2017-10-03 ENCOUNTER — Encounter: Payer: Self-pay | Admitting: Radiation Oncology

## 2017-10-03 ENCOUNTER — Ambulatory Visit: Payer: Medicare HMO | Admitting: Nurse Practitioner

## 2017-10-03 ENCOUNTER — Other Ambulatory Visit: Payer: Self-pay

## 2017-10-03 ENCOUNTER — Other Ambulatory Visit: Payer: Self-pay | Admitting: *Deleted

## 2017-10-03 VITALS — BP 155/76 | HR 69 | Temp 96.2°F | Resp 20 | Wt 188.1 lb

## 2017-10-03 DIAGNOSIS — C44529 Squamous cell carcinoma of skin of other part of trunk: Secondary | ICD-10-CM

## 2017-10-03 DIAGNOSIS — Z923 Personal history of irradiation: Secondary | ICD-10-CM | POA: Insufficient documentation

## 2017-10-03 NOTE — Progress Notes (Signed)
Radiation Oncology Follow up Note  Name: [name]   Date:   10/03/2017 MRN:  [id number] DOB: Dec 28, 1950    This 67 y.o. male presents to the clinic today for 50 month follow-up status post radiation therapy to his buttocks for a carcinoma with basaloid features.  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  COMPLICATIONS OF TREATMENT: none  FOLLOW UP COMPLIANCE: keeps appointments   PHYSICAL EXAM:  BP (!) 155/76   Pulse 69   Temp (!) 96.2 F (35.7 C)   Resp 20   Wt 188 lb 0.8 oz (85.3 kg)   BMI 27.77 kg/m  Areas firm with some slight ulceration towards the center. No evidence of inguinal adenopathy or lower extremity edema is noted. Well-developed well-nourished patient in NAD. HEENT reveals PERLA, EOMI, discs not visualized.  Oral cavity is clear. No oral mucosal lesions are identified. Neck is clear without evidence of cervical or supraclavicular adenopathy. Lungs are clear to A&P. Cardiac examination is essentially unremarkable with regular rate and rhythm without murmur rub or thrill. Abdomen is benign with no organomegaly or masses noted. Motor sensory and DTR levels are equal and symmetric in the upper and lower extremities. Cranial nerves II through XII are grossly intact. Proprioception is intact. No peripheral adenopathy or edema is identified. No motor or sensory levels are noted. Crude visual fields are within normal range.  RADIOLOGY RESULTS: no current films for review  PLAN: at this time I am making referral to Dr. Tollie Pizza for possible excision of this area. I've  explained this all to the patient. Will make further recommendations after he has been seen by surgery. Patient is comfortable with that decision. Will follow-up after surgery with the patient.  I would like to take this opportunity to thank you for allowing me to participate in the care of your patient.Noreene Filbert, MD

## 2017-10-15 ENCOUNTER — Ambulatory Visit (INDEPENDENT_AMBULATORY_CARE_PROVIDER_SITE_OTHER): Payer: Medicare HMO | Admitting: General Surgery

## 2017-10-15 ENCOUNTER — Encounter: Payer: Self-pay | Admitting: General Surgery

## 2017-10-15 VITALS — BP 132/78 | HR 70 | Resp 18 | Ht 72.0 in | Wt 182.0 lb

## 2017-10-15 DIAGNOSIS — C44529 Squamous cell carcinoma of skin of other part of trunk: Secondary | ICD-10-CM | POA: Diagnosis not present

## 2017-10-15 DIAGNOSIS — K6289 Other specified diseases of anus and rectum: Secondary | ICD-10-CM | POA: Diagnosis not present

## 2017-10-15 NOTE — Progress Notes (Signed)
Patient ID: [name], male   DOB: 21-Nov-1950, 67 y.o.   MRN: [id number]  Chief Complaint  Patient presents with  . Rectal Problems    HPI [name] is a 67 y.o. male.  Here for evaluation of a rectal mass that he can feel referred by Dr Baruch Gouty. 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 No pain with BM, no bleeding. He has noticed it for about 2 months. Bowels move about every 2-3 days. He admits to taking pepto bismol for a BM, he has taken miralax in the past.  HPI  Past Medical History:  Diagnosis Date  . Anemia   . Cancer ([place]) 11/02/2014   PERIANAL MASS, RIGHT; INCISIONAL BIOPSY: INVASIVE CARCINOMA WITH BASALOID FEATURES  . CHF (congestive heart failure) ([place])   . Diabetes mellitus    Patient take Metformin.  Marland Kitchen GERD (gastroesophageal reflux disease)   . Hypertension   . Leg swelling   . Loose, teeth    PATIENT STATES "HAS 6 TEETH, SOME ARE LOOSE"  . Neuromuscular disorder ([place])    RIGHT HAND AND FINGERS NUMB  . Shortness of breath dyspnea   . 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 APPENDECTOMY    . COLONOSCOPY WITH PROPOFOL N/A 11/19/2014   Procedure: COLONOSCOPY WITH PROPOFOL;  Surgeon: Lucilla Lame, MD;  Location: [facility];  Service: Endoscopy;  Laterality: N/A;  DIABETIC-ORAL MEDS  . COLONOSCOPY WITH PROPOFOL N/A 10/04/2016   Procedure: COLONOSCOPY WITH PROPOFOL;  Surgeon: Jonathon Bellows, MD;  Location[facility] ENDOSCOPY;  Service: Endoscopy;  Laterality: N/A;  . ESOPHAGOGASTRODUODENOSCOPY (EGD) WITH PROPOFOL N/A 10/04/2016   Procedure: ESOPHAGOGASTRODUODENOSCOPY (EGD) WITH PROPOFOL;  Surgeon: Jonathon Bellows, MD;  Location: [facility] ENDOSCOPY;  Service: Endoscopy;  Laterality: N/A;  . GIVENS CAPSULE STUDY N/A 11/13/2016   Procedure: GIVENS CAPSULE STUDY;  Surgeon: Jonathon Bellows, MD;  Location: [facility] ENDOSCOPY;  Service:  Gastroenterology;  Laterality: N/A;  . INGUINAL LYMPH NODE BIOPSY Right 01/26/2016   Procedure: INGUINAL LYMPH NODE BIOPSY;  Surgeon: Clayburn Pert, MD;  Location: [facility] ORS;  Service: General;  Laterality: Right;  . RECTAL BIOPSY N/A 11/02/2014   Procedure: BIOPSY RECTAL;  Surgeon: Marlyce Huge, MD;  Location: [facility] ORS;  Service: General;  Laterality: N/A;  . RECTAL EXAM UNDER ANESTHESIA N/A 11/02/2014   Procedure: RECTAL EXAM UNDER ANESTHESIA;  Surgeon: Marlyce Huge, MD;  Location: [facility] ORS;  Service: General;  Laterality: N/A;  . TONSILLECTOMY      Family History  Problem Relation Age of Onset  . Asthma Mother   . Colon cancer Neg Hx     Social History Social History   Tobacco Use  . Smoking status: Current Every Day Smoker    Packs/day: 0.25    Years: 20.00    Pack years: 5.00    Types: Cigarettes  . Smokeless tobacco: Never Used  . Tobacco comment: pt claims no smoking x 3 d  Substance Use Topics  . Alcohol use: No    Comment: per pt used to drink beer daily- quit may/2018  . Drug use: Yes    Types: Marijuana    Comment: Last Use 2016 per patient    No Known Allergies  Current Outpatient Medications  Medication Sig Dispense Refill  . amLODipine (NORVASC) 10 MG tablet Take 10 mg by mouth daily.    Marland Kitchen  aspirin EC 81 MG tablet Take 81 mg by mouth daily.    Marland Kitchen atorvastatin (LIPITOR) 40 MG tablet Take 40 mg by mouth at bedtime.    . bismuth subsalicylate (PEPTO BISMOL) 262 MG/15ML suspension Take 30 mLs by mouth every 6 (six) hours as needed.    . chlorthalidone (HYGROTON) 25 MG tablet Take by mouth.    . ferrous sulfate 325 (65 FE) MG tablet Take by mouth.    . hydrALAZINE (APRESOLINE) 25 MG tablet Take 25 mg 2 (two) times daily by mouth.    Marland Kitchen omeprazole (PRILOSEC) 40 MG capsule Take by mouth.    . polyethylene glycol (MIRALAX / GLYCOLAX) packet Take by mouth.    . vitamin B-12 (CYANOCOBALAMIN) 1000 MCG tablet Take 1 tablet (1,000 mcg total) by mouth  daily. (Patient not taking: Reported on 10/15/2017) 30 tablet 3   No current facility-administered medications for this visit.    Facility-Administered Medications Ordered in Other Visits  Medication Dose Route Frequency Provider Last Rate Last Dose  . Darbepoetin Alfa (ARANESP) injection 100 mcg  100 mcg Subcutaneous Q30 days Sindy Guadeloupe, MD   100 mcg at 07/25/17 0924  . Darbepoetin Alfa (ARANESP) injection 100 mcg  100 mcg Subcutaneous Q30 days Sindy Guadeloupe, MD   100 mcg at 08/22/17 1015  . epoetin alfa (EPOGEN,PROCRIT) injection 40,000 Units  40,000 Units Subcutaneous Weekly Sindy Guadeloupe, MD   40,000 Units at 05/23/17 1145    Review of Systems Review of Systems  Constitutional: Negative.   Respiratory: Negative.   Cardiovascular: Negative.   Gastrointestinal: Positive for constipation. Negative for anal bleeding, blood in stool and diarrhea.    Blood pressure 132/78, pulse 70, resp. rate 18, height 6' (1.829 m), weight 182 lb (82.6 kg), SpO2 99 %.  Physical Exam Physical Exam  Constitutional: He is oriented to person, place, and time. He appears well-developed and well-nourished.  HENT:  Mouth/Throat: No oropharyngeal exudate.  Eyes: Conjunctivae are normal. No scleral icterus.  Neck: Neck supple.  Cardiovascular: Normal rate and regular rhythm.  Murmur heard.  Systolic murmur is present with a grade of 2/6. Pulmonary/Chest: Effort normal and breath sounds normal.  Abdominal: Soft. Bowel sounds are normal.  Well healed right groin incision  Genitourinary:     Genitourinary Comments: 2.7 x 2 cm right buttock   Lymphadenopathy:       Right: No inguinal and no supraclavicular adenopathy present.       Left: No inguinal and no supraclavicular adenopathy present.  Neurological: He is alert and oriented to person, place, and time.  Skin: Skin is warm and dry.  Psychiatric: His behavior is normal.    Data Reviewed November 02, 2014 surgical biopsy:  B. PERIANAL MASS,  RIGHT; INCISIONAL BIOPSY:  - INVASIVE CARCINOMA WITH BASALOID FEATURES.  - SEE COMMENT.  Comment #2:  Immunohistochemical stains were performed. The carcinoma is positive for  p16, BerEp4, and BCL-2. Stain controls worked appropriately.  Unfortunately the pattern of staining does not assist with further  classification of the carcinoma. Clinical correlation is required to  determine if the lesion involves perianal skin primarily (BCC) or is  arising in the anal canal (squamous cell carcinoma with basaloid  features).  Measurement: 1.2 x 0.5 x 0.3 and 1.3 x 0.7 x 0.3 cm   Original history and physical from November 02, 2014 admission described a 3 cm flat lesion on the right side near the gluteal cleft.  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    Plan    Indications for biopsy were reviewed with the patient and he was amenable to proceed.  The area was cleansed with alcohol and 10 cc of 0.5% Xylocaine with 0.25% Marcaine with 1-200,000 as of epinephrine was utilized and well-tolerated.  Dense scarring in the deep tissue was evident.  The area was cleaned with Betadine solution and a 3 mm punch biopsy obtained.  The defect was closed with a single 3-0 Vicryl suture.  Dry dressing was applied.  After the local anesthetic was instilled the procedure was well-tolerated.    The patient has requested that we contact Shari Prows at [phone] when pathology is available.  HPI, Physical Exam, Assessment and Plan have been scribed under the direction and in the presence of Robert Bellow, MD. Karie Fetch, RN  I have completed the exam and reviewed the above documentation for accuracy and completeness.  I agree with the above.  Haematologist has been used and any errors in dictation or transcription are unintentional.  Hervey Ard, M.D., F.A.C.S.  [name] 10/16/2017, 5:28 PM

## 2017-10-15 NOTE — Patient Instructions (Signed)
The patient is aware to call back for any questions or concerns.  

## 2017-10-16 DIAGNOSIS — K6289 Other specified diseases of anus and rectum: Secondary | ICD-10-CM | POA: Insufficient documentation

## 2017-10-17 ENCOUNTER — Inpatient Hospital Stay: Payer: Medicare HMO

## 2017-10-17 ENCOUNTER — Inpatient Hospital Stay: Payer: Medicare HMO | Attending: Oncology

## 2017-10-17 ENCOUNTER — Encounter: Payer: Medicare HMO | Attending: Nurse Practitioner | Admitting: Nurse Practitioner

## 2017-10-17 VITALS — BP 169/91 | HR 63

## 2017-10-17 DIAGNOSIS — Z85828 Personal history of other malignant neoplasm of skin: Secondary | ICD-10-CM | POA: Diagnosis not present

## 2017-10-17 DIAGNOSIS — I509 Heart failure, unspecified: Secondary | ICD-10-CM | POA: Insufficient documentation

## 2017-10-17 DIAGNOSIS — N189 Chronic kidney disease, unspecified: Principal | ICD-10-CM

## 2017-10-17 DIAGNOSIS — J90 Pleural effusion, not elsewhere classified: Secondary | ICD-10-CM | POA: Insufficient documentation

## 2017-10-17 DIAGNOSIS — L97522 Non-pressure chronic ulcer of other part of left foot with fat layer exposed: Secondary | ICD-10-CM | POA: Diagnosis not present

## 2017-10-17 DIAGNOSIS — I890 Lymphedema, not elsewhere classified: Secondary | ICD-10-CM | POA: Insufficient documentation

## 2017-10-17 DIAGNOSIS — Z923 Personal history of irradiation: Secondary | ICD-10-CM | POA: Diagnosis not present

## 2017-10-17 DIAGNOSIS — K219 Gastro-esophageal reflux disease without esophagitis: Secondary | ICD-10-CM | POA: Diagnosis not present

## 2017-10-17 DIAGNOSIS — I87313 Chronic venous hypertension (idiopathic) with ulcer of bilateral lower extremity: Secondary | ICD-10-CM | POA: Diagnosis not present

## 2017-10-17 DIAGNOSIS — I110 Hypertensive heart disease with heart failure: Secondary | ICD-10-CM | POA: Diagnosis not present

## 2017-10-17 DIAGNOSIS — M86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, right ankle and foot: Secondary | ICD-10-CM | POA: Diagnosis not present

## 2017-10-17 DIAGNOSIS — I130 Hypertensive heart and chronic kidney disease with heart failure and stage 1 through stage 4 chronic kidney disease, or unspecified chronic kidney disease: Secondary | ICD-10-CM | POA: Diagnosis present

## 2017-10-17 DIAGNOSIS — E1122 Type 2 diabetes mellitus with diabetic chronic kidney disease: Secondary | ICD-10-CM | POA: Insufficient documentation

## 2017-10-17 DIAGNOSIS — D631 Anemia in chronic kidney disease: Secondary | ICD-10-CM | POA: Diagnosis not present

## 2017-10-17 DIAGNOSIS — L97512 Non-pressure chronic ulcer of other part of right foot with fat layer exposed: Secondary | ICD-10-CM | POA: Diagnosis not present

## 2017-10-17 DIAGNOSIS — E11621 Type 2 diabetes mellitus with foot ulcer: Secondary | ICD-10-CM | POA: Diagnosis not present

## 2017-10-17 DIAGNOSIS — Z79899 Other long term (current) drug therapy: Secondary | ICD-10-CM | POA: Diagnosis not present

## 2017-10-17 DIAGNOSIS — E1140 Type 2 diabetes mellitus with diabetic neuropathy, unspecified: Secondary | ICD-10-CM | POA: Diagnosis not present

## 2017-10-17 DIAGNOSIS — E1169 Type 2 diabetes mellitus with other specified complication: Secondary | ICD-10-CM | POA: Diagnosis not present

## 2017-10-17 DIAGNOSIS — F17210 Nicotine dependence, cigarettes, uncomplicated: Secondary | ICD-10-CM | POA: Insufficient documentation

## 2017-10-17 DIAGNOSIS — D649 Anemia, unspecified: Secondary | ICD-10-CM

## 2017-10-17 DIAGNOSIS — F1019 Alcohol abuse with unspecified alcohol-induced disorder: Secondary | ICD-10-CM | POA: Insufficient documentation

## 2017-10-17 DIAGNOSIS — K7460 Unspecified cirrhosis of liver: Secondary | ICD-10-CM | POA: Diagnosis not present

## 2017-10-17 DIAGNOSIS — D472 Monoclonal gammopathy: Secondary | ICD-10-CM | POA: Insufficient documentation

## 2017-10-17 LAB — IRON AND TIBC
IRON: 299 ug/dL — AB (ref 45–182)
Saturation Ratios: 89 % — ABNORMAL HIGH (ref 17.9–39.5)
TIBC: 337 ug/dL (ref 250–450)
UIBC: 38 ug/dL

## 2017-10-17 LAB — CBC
HCT: 25.7 % — ABNORMAL LOW (ref 40.0–52.0)
Hemoglobin: 8.6 g/dL — ABNORMAL LOW (ref 13.0–18.0)
MCH: 29.7 pg (ref 26.0–34.0)
MCHC: 33.6 g/dL (ref 32.0–36.0)
MCV: 88.3 fL (ref 80.0–100.0)
Platelets: 210 10*3/uL (ref 150–440)
RBC: 2.91 MIL/uL — ABNORMAL LOW (ref 4.40–5.90)
RDW: 14.2 % (ref 11.5–14.5)
WBC: 5.8 10*3/uL (ref 3.8–10.6)

## 2017-10-17 LAB — FERRITIN: Ferritin: 48 ng/mL (ref 24–336)

## 2017-10-17 MED ORDER — EPOETIN ALFA-EPBX 40000 UNIT/ML IJ SOLN
40000.0000 [IU] | Freq: Once | INTRAMUSCULAR | Status: AC
  Administered 2017-10-17: 40000 [IU] via SUBCUTANEOUS
  Filled 2017-10-17: qty 1

## 2017-10-17 NOTE — Progress Notes (Signed)
Patient is here for injection, bp has been checked three times with elevated readings. Last reading was 169/91. Per Dr. Janese Banks ok to proceed with injection Retacrit.

## 2017-10-18 LAB — KAPPA/LAMBDA LIGHT CHAINS
KAPPA, LAMDA LIGHT CHAIN RATIO: 1.77 — AB (ref 0.26–1.65)
Kappa free light chain: 128.6 mg/L — ABNORMAL HIGH (ref 3.3–19.4)
LAMDA FREE LIGHT CHAINS: 72.8 mg/L — AB (ref 5.7–26.3)

## 2017-10-19 NOTE — Progress Notes (Signed)
SONNY, ANTHES ([id number]) Visit Report for 10/17/2017 Chief Complaint Document Details Patient Name: [name], [name] Date of Service: 10/17/2017 8:30 AM Medical Record Number: [id number] Patient Account Number: [id number] Date of Birth/Sex: 02-19-1951 (67 y.o. M) Treating RN: Ahmed Prima Primary Care Provider: Lamonte Sakai Other Clinician: Referring Provider: Lamonte Sakai Treating Provider/Extender: Cathie Olden in Treatment: 81 Information Obtained from: Patient Chief Complaint Patient presents for treatment of an open diabetic ulcer to both feet Electronic Signature(s) Signed: 10/17/2017 9:25:26 AM By: Lawanda Cousins Entered By: Lawanda Cousins on 10/17/2017 09:25:26 Bonneau, [name] ([id number]) -------------------------------------------------------------------------------- Debridement Details Patient Name: Prichard, Jiraiya E. Date of Service: 10/17/2017 8:30 AM Medical Record Number: [id number] Patient Account Number: [id number] Date of Birth/Sex: [birth year] (67 y.o. M) Treating RN: Ahmed Prima Primary Care Provider: Lamonte Sakai Other Clinician: Referring Provider: Lamonte Sakai Treating Provider/Extender: Cathie Olden in Treatment: 55 Debridement Performed for Wound #1 Right Toe Great Assessment: Performed By: Physician Lawanda Cousins, NP Debridement Type: Debridement Severity of Tissue Pre Fat layer exposed Debridement: Pre-procedure Verification/Time Yes - 09:04 Out Taken: Start Time: 09:04 Pain Control: Lidocaine 4% Topical Solution Total Area Debrided (L x W): 0.9 (cm) x 1 (cm) = 0.9 (cm) Tissue and other material Viable, Non-Viable, Callus, Slough, Skin: Dermis , Fibrin/Exudate, Slough debrided: Level: Skin/Dermis Debridement Description: Selective/Open Wound Instrument: Curette Bleeding: Minimum Hemostasis Achieved: Pressure End Time: 09:06 Response to Treatment: Procedure was tolerated well Level of Consciousness: Awake and Alert Post  Debridement Measurements of Total Wound Length: (cm) 0.9 Width: (cm) 1 Depth: (cm) 0.2 Volume: (cm) 0.141 Character of Wound/Ulcer Post Debridement: Requires Further Debridement Severity of Tissue Post Debridement: Fat layer exposed Post Procedure Diagnosis Same as Pre-procedure Electronic Signature(s) Signed: 10/17/2017 9:25:10 AM By: Lawanda Cousins Signed: 10/18/2017 11:31:29 AM By: Alric Quan Entered By: Lawanda Cousins on 10/17/2017 09:25:09 Capozzoli, [name] ([id number]) -------------------------------------------------------------------------------- HPI Details Patient Name: Aydin, Othmar E. Date of Service: 10/17/2017 8:30 AM Medical Record Number: [id number] Patient Account Number: [id number] Date of Birth/Sex: [birth year] (67 y.o. M) Treating RN: Ahmed Prima Primary Care Provider: Lamonte Sakai Other Clinician: Referring Provider: Lamonte Sakai Treating Provider/Extender: Cathie Olden in Treatment: 62 History of Present Illness Location: bilateral feet ulceration on the toes Quality: Patient reports experiencing a dull pain to affected area(s). Severity: Patient states wound are getting better Duration: Patient has had the wound for > 3 months prior to seeking treatment at the wound center Timing: Pain in wound is constant (hurts all the time) Context: The wound would happen gradually Modifying Factors: Other treatment(s) tried include:treatment for lymphedema and is seen by the podiatrist Dr. Caryl Comes Associated Signs and Symptoms: Patient reports having increase swelling. HPI Description: 67 year old patient here to see as for bilateral feet ulceration to on his left first and second toe and 2 on his right first and second toe, which she's had for about 4 months. 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 Donella Stade. The patient is also  undergoing workup by medical oncology for a intra-abdominal lymphadenopathy. 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 He is also status post appendectomy, inguinal lymph node biopsy, rectal biopsy and rectal examination under anesthesia. he currently smokes cigarettes about half packet a day. In March of this year he was seen by Dr. Belenda Cruise  Schnier, for evaluation of bilateral varicose veins and besides wearing compression stockings he had recommended laser ablation of the right and left great saphenous veins to eleviate the symptoms and complications of severe superficial venous reflux disease. He also recommended lymphedema pumps for better control of his lymphedema. The patient recently has had on 08/23/2016, right greater saphenous vein ablation with the laser energy Earlier lower extremity venous reflux examination done on 05/08/2016 showed no DVT or SVT both lower legs but incompetence of bilateral great saphenous veins was present. A lower arterial study was also done and there was no significant right lower and left lower extremity problems based on a normal toe brachial index bilaterally and the ABI was 1.21 the left and 1.23 on the right. His post ablation venous duplex examination showed successful ablation of the right GS vein with thrombus formation 2 below the right saphenofemoral junction. The deep system was patent without evidence of thrombosis and this was done on 08/30/2016. 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 Addendum: regarding his x-rays done today and x-ray of the left foot -- IMPRESSION: No objective evidence of osteomyelitis. There are soft tissue changes which may reflect cellulitis. X-ray of the right foot -- IMPRESSION:Findings  compatible with cellulitis of the toes. No objective evidence of osteomyelitis is observed. 10/01/16 on evaluation today patient's wounds appeared to be doing some better. I did review the x-rays as well which showed no evidence of osteomyelitis although there was evidence on x-ray of cellulitis. He fortunately is not having any discomfort although he continues to have some swelling. He does not remember being on any antibiotics recently. 10/15/16 on evaluation today patient's wounds overall appear to be doing better although he does have a new location noted on the left foot. Fortunately he is not having significant pain. It almost has the appearance that something is rubbing on the end of his toes but he wears the open toe shoes and according to what he is telling me never wears anything that would rub on his foot. There is no evidence of infection and specifically no evidence of a fungal infection 10/22/16 On evaluation today patient's wounds appeared to be doing better compared to last week in regard to his bilateral Bastone, Dashiel E. ([id number]) lower extremities. Fortunately I happy with how things are progressing although he still has ulcers I feel like that he is improving and appropriate manner. 11/12/16 on evaluation today patient appears to be doing well in regard to his bilateral feet and the respective wounds. We have been using surrounding her dressings along with an antifungal cream which seems to be doing very well. He has no bilateral dysfunction noticed that the rituals are weight loss at this point. He also has no nausea or vomiting a note purulent discharge. He did see Vein and vascular today and he tells me that they told him he could have surgery for his venous stasis but they did not feel like it was worth it in his words. Fortunately patient's wounds do appear to be getting sneakily better. 11/26/2016 -- he says he is going to have some surgery during this week at [facility] for  possibly a colon resection. 12/31/2016 -- the patient has been noncompliant with his smoking and I'm not sure whether he is also started drinking again. He continues to be very nonchalant about his care 01/14/2017 -- the patient's HandP  has been reviewed well and I understand he is being compliant with trying to give up smoking and his local dressing changes. He does not have any surgical options of 4 to him by his vascular surgeons.he was last seen in early August by Dr. Hortencia Pilar who recommended compression stockings,and possibly lymph pumps in 2-3 months after doing a review ultrasound. 01/28/2017 - the patient did not have any fresh complaints but on examination I noted a large lacerated wound on the plantar aspect of his right fourth toe which had a lot of necrotic debris and it probes down to bone. 02/07/2017 -- x-ray of the right foot -- IMPRESSION: Soft tissue swelling about the first through fourth toes consistent with cellulitis. New destructive change in the tuft of the distal phalanx of the great toe is consistent with osteomyelitis. 02/14/2017 -- the patient's MRI is pending this coming Monday and he still continues to smoke. We have again gone over off loading of his wounds in great detail and he says he's been compliant. 02/21/2017 -- MR of the right foot -- IMPRESSION: 1. Soft tissue ulcer at the tip of the first, second and third toe knows. Cortical irregularity and bone marrow edema in the first distal phalanx most concerning for osteomyelitis. Mild marrow edema in the second and third distal phalanx without definite cortical destruction which may reflect early osteomyelitis versus reactive marrow edema. 2. Soft tissue edema surrounding the first phalanx most consistent with cellulitis. the patient was also recently evaluated by his medical oncologist Dr. 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 She is  treating him with weekly Procrit. She is also keeping intra-abdominal lymphadenopathy and right lower lobe lung nodule under observation. 04/04/2017 -- he was seen by Dr. Adrian Prows on 03/25/2017 -- after review he empirically put him on ciprofloxacin and doxycycline as they have good bone penetration and good bioavailability and it will cover the usual pathogens and diabetic foot osteomyelitis. He will check inflammatory markers and plan a 24-17 week old records. C-reactive protein was 0.3 and the ESR was 72 04/18/17 on evaluation today patient appears to be doing about the same in regard to his lower extremity wounds bilaterally. He has continued to use the antifungal cream which does seem to be beneficial. Nonetheless the ulcers do seem to in some areas be epithelial eyes over and in other areas are still open. He is having no significant discomfort. 04/25/17-he is here in follow-up evaluation for multiple ulcerations to multiple toes bilaterally. He states he did see Dr. Ola Spurr again last week and continues antibiotic therapy. He is voicing no complaints or concerns, will continue with current treatment plan will possibility of adding compression therapy next week after an additional week of treatment/lotions to BLE prescribed by Dr Ola Spurr 05/02/17 he is here in follow up for for multiple ulcers to multiple toes bilaterally. we will stop using antifungal cream and will continue with silvercel and follow up next week 05/09/17-he is here in follow-up for multiple ulcerations to multiple toes bilaterally. There is improvement in appearance. He has not completely stopped using antifungal cream, but admits he has not using it between the toes. He has an appointment with Dr. Ola Spurr on 2/11, continues on doxycycline and Cipro. It has been 5 weeks of antibiotic therapy, we will order plain film xray to evaluate for osteomyelitis next week, prior to follow up with ID. Will continue with  silvercel and follow up  next week 05/16/17-he is here in follow-up evaluation for multiple ulcerations to multiple toes bilaterally and new wound to the right posterior heel. There is essentially no change in appearance, deteriorating measurements; he has a history of waxing and waning measurements. He admits that he continues to apply moisturizer/cream/ointment to his toes despite weekly reminders to only apply silvercel to his toes. He states that he thinks the surgical shoe contributed to the superficial ulcer to his posterior Casanova, Nevaeh E. ([id number]) heel, he is unable to articulate if this was an area of dry cracked skin as he has a similar area to the left heel. He now is wearing open toed slippers. He has an appointment with Dr. Ola Spurr on 2/11. We have ordered x-rays for her bilateral feet; he was advised to obtain the x-rays today or tomorrow. He will follow-up next week 05/23/17-he is here in follow-up evaluation for multiple ulcerations to multiple toes bilaterally and the right posterior heel. There is improvement in maceration. He has been compliant and not applying any moisturizing agent to his toes. He has been using Lac-Hydrin for his lower extremities with improvement. He did not go to his appointment on Monday with Dr. Ola Spurr secondary to financial concerns. X-rays for her bilateral feet showed: LEFT FOOT with slight erosion of the tuft of the distal phalanges of the left first and second toe suspicious for osteomyelitis, RIGHT FOOT with 1.erosion of the tufts of the distal phalanges of the right first second and possibly third toes consistent with osteomyelitis, 2 no definitive abnormality of the calcaneus is seen on the images obtained, 3. Plantar calcaneal degenerative spur. We briefly discussed hyperbaric adjunctive therapy for treatment of chronic refractory osteomyelitis. I do not find an a1c in EMR, will contact PCP for record, or order if needed. He has been  encouraged to contact Dr Ola Spurr office regarding the follow-up appointment, encouraged him to inquire about payment plan. We will continue with same treatment plan and follow-up next week. He states he is still taking antibiotics and has "a lot" left. He states he has been taking them as directed, 2 pills twice daily. According to Dr. Blane Ohara office notes he was originally started on 12/17 for 4 weeks and extended on 1/14 for an additional 4 weeks. He should be done with his antibiotic therapy, he was advised to bring his bottles and to his next appointment, we will contact pharmacy. 05/30/17-he is here in follow-up evaluation for multiple ulcerations to multiple toes bilaterally and the right posterior heel. He is accompanied by his brother-in-law. Wounds are stable. He has yet to make up with Dr. Ola Spurr. We contacted his PCP, with no record of recent A1c we will draw an A1c. His brother-in-law states that he was taken off all of his diabetic medication secondary to kidney function. 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 The antibiotics were counted and have approximately 2 weeks left, although they should be complete. The pill organizer reveals missing days. We discussed the need for consistent medications, to have optimal benefit of medication. He has a cousin that lives with him and he will ask her to check his organizer daily. He has been advised to follow up with Dr Ola Spurr, and will go by the office today. He has been advised to quit smoking. 06/06/17-he is here in follow up evaluation. He has had to make an appointment with Dr. Ola Spurr. He did have blood work obtained,  a1c 5. He continues to take antibiotic therapy. Significant improvement in bilateral lower extremity edema with compression therapy. Essentially no change in ulcerations to toes. He states he is "going to try  something different" and "let me know next week" if it works; he would not provide any additional information and was encouraged to follow our orders. We will follow up next week 06/13/17-he is here in follow-up evaluation. He has an appointment with Dr. Ola Spurr tomorrow morning. He states he purchased an ointment from [facility] and applied to his toes for 3 days, he does not remember the name of the ointment. There is improvement to his wounds, minimal maceration. He continues to take antibiotic therapy, this should have been completed last month. His brother-in-law who regularly accompanies his appointments was asked to take the bottles to the appointment tomorrow with Dr. Ola Spurr so he is aware. We will continue with 3 layer compression, and order OPEN TOE compression 20-30mmHg; we will apply compression stockings next week. He continues to smoke, smoked "2 cigarettes" last week 06/20/17 on evaluation today patient did receive his compression stockings which he has with him today for both lower extremities. With that being said he tells me at this point in time that he is very happy to have these he really is not a big fan of the compression wraps that we have been utilizing although they have been of great benefit for him. Nonetheless at this point he does want to switch to the compression stockings. In my opinion as long as he is continuing with compression I'm okay with the stockings or the wraps. 06/27/17-he is here in follow-up evaluation for multiple ulcerations to his bilateral toes. There is some improvement in appearance. He is compliant in wearing his compression stockings with significant improvement in lower extremity edema. He saw Dr Ola Spurr on 3/8, per his notes they would redraw ESR and CRP; plan to continue antibiotic therapy if these remain elevated. I do not see an ESR or CRP level in Epic. The patient continues to take antibiotics. 07/11/17-He is here in follow-up  evaluation for multiple ulcerations to multiple toes bilaterally. He presents with complete epithelialization to the right third toe; there has been no deterioration. He continues on antibiotic therapy. He will follow-up next week 07/18/17-He is here in follow-up evaluation for multiple ulcerations to multiple toes bilaterally. He continues to make improvement. He continues on antibiotic therapy. He states he has been using something additional to our orders, he does not elaborate but states he will bring it in next week. 07/25/17-He is here in follow up evaluation for multiple ulcerations to bilateral toes. He is stable. He has completed antibiotic therapy. He admits to "filing" his toes after showers each evening, this is what he was referencing last week; he does not filing for the wounds. We will switch to [facility] and monitor for any improvement, he will follow-up next week 08/01/17-He is here in follow-up evaluation. He admits to "picking" at his toes after cleansing yesterday, leading to new areas of tissue loss on the bilateral second toe. There is improvement noted to the bilateral great toe. We will dress toes today and hope that they maintain until Monday where he will come in for a nurse visit. He has been advised, multiple times with expressed verbalization, to change the dressings to silvercel if the dressings get wet prior to [facility] appointment. KARSIN, PESTA ([id number]) 08/08/17-He is here in follow-up evaluation for bilateral first and second toe ulcerations. There is significant  improvement to all ulcerations since last visit. We will switch to [facility] to all wounds and he will continue with nurse visits on a Monday/Thursday schedule and follow-up with me in 2 weeks. He continues to smoke, 1-3 cigarettes per day, and has been encouraged to not smoke until his next follow-up in 2 weeks. 08/15/17 on evaluation today patient's ulcers on his toes actually appear to be doing  fairly well the right to ulcers may be a little bit more moist compared to the left I'm not really sure exactly why as the openings appear to be very small and I'm not seeing any evidence of anything significant as far as you lightest or otherwise. Nonetheless this may just be a small setback he's been doing very well with the [facility] Dressing 08/22/17-He is here in follow-up evaluation for ulcerations to his bilateral first and second toes, there is small/scant amount of drainage noted on today's dressing. He continues with Hydrofera Blue. He continues to smoke, 1 cigarette a day. Voices no complaint or concerns, compliant and compression therapy today. He'll follow-up next week 08/29/17-He is here in follow-up evaluation for ulcerations to his bilateral first and second toes, with a new wound to the left third toe. He states he cut himself while cutting the toenail. He presents today with more maceration and increased measurements; waxing and waning measurements and moisture has been an ongoing issue. He continues to smoke a proximally 1 cigarette per day, but states he has not smoked since Sunday. We will initiate medihoney daily and evaluate next week. He presents today without compression stockings 09/05/17-He is here in follow-up evaluation for ulcerations to bilateral first and second and left third toe. He did not pick up the medihoney and therefore has not been changing his dressing. He admits to inconsistent where of compression stockings, admits to pain to his bilateral lower extremities with unilateral edema (right greater than left). The ulcerations to multiple toes are more macerated and larger in size than last week. He is wearing compression therapy today. He admits to an overall feeling of weakness and fatigue and has been encouraged to contact his PCP for evaluation; he has a known history of anemia and intraabdominal lymphadenopathy. We will return to silvercel and he will  follow up next week 09/19/17-He is here in follow-up evaluation for ulcerations to bilateral first and second toe.he was unable to make last week's appointment. The culture that was obtained on 5/30 grew clindamycin sensitive MRSA; he was initiated on 6/4 but did not start it until 6/7. There is significant improvement in all wounds, healing to left second and third toe. He also admits to having no alcohol or smoking for the last 6 days. We will continue with same treatment plan I will extend the clindamycin for an additional 10 days and he will follow-up next week. 09/26/17- He is here in follow evaluation for multiple ulcerations to multiple toes bilaterally. He continues to be non-compliant with dressing, compression therapy. He states he is taking the antibiotics as prescribed, although his recall is inconsistent. There is noted deterioration in all ulcers. We will continue same treatment plan, antibiotic therapy and he will follow up next week. 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 This information is obtained from the medical record, as he cannot articulate specifics to his diagnosis  or treatment plan. 10/17/17-He is here in follow-up evaluation for multiple ulcerations to multiple toes bilaterally. There is chronic waxing and waning improvement/deterioration. On today's exam they appear improved, dry without maceration. He continues to apply a variety of topical agents, stating that he has switched between the blue product and silver product. In my opinion, this will be a chronic waxing and waning giving his intermittent compliance. He will follow-up in 2 weeks. As it relates to the biopsy taken from his buttock, he has not followed up with that and has not started radiation therapy. Electronic Signature(s) Signed:  10/17/2017 9:27:50 AM By: Lawanda Cousins Entered By: Lawanda Cousins on 10/17/2017 09:27:49 Jumper, [name] ([id number]) -------------------------------------------------------------------------------- Physician Orders Details Patient Name: Wisor, Basilio E. Date of Service: 10/17/2017 8:30 AM Medical Record Number: [id number] Patient Account Number: [id number] Date of Birth/Sex: 05/10/1950 (67 y.o. M) Treating RN: Ahmed Prima Primary Care Provider: Lamonte Sakai Other Clinician: Referring Provider: Lamonte Sakai Treating Provider/Extender: Cathie Olden in Treatment: 15 Verbal / Phone Orders: Yes Clinician: Carolyne Fiscal, Debi Read Back and Verified: Yes Diagnosis Coding Wound Cleansing Wound #1 Right Toe Great o Clean wound with Normal Saline. Wound #14 Left Toe Second o Clean wound with Normal Saline. Wound #2 Right Toe Second o Clean wound with Normal Saline. Wound #5 Left Toe Great o Clean wound with Normal Saline. Anesthetic (add to Medication List) Wound #1 Right Toe Great o Topical Lidocaine 4% cream applied to wound bed prior to debridement (In Clinic Only). Wound #14 Left Toe Second o Topical Lidocaine 4% cream applied to wound bed prior to debridement (In Clinic Only). Wound #2 Right Toe Second o Topical Lidocaine 4% cream applied to wound bed prior to debridement (In Clinic Only). Wound #5 Left Toe Great o Topical Lidocaine 4% cream applied to wound bed prior to debridement (In Clinic Only). Primary Wound Dressing Wound #1 Right Toe Great o Silver Alginate Wound #14 Left Toe Second o Silver Alginate Wound #2 Right Toe Second o Silver Alginate Wound #5 Left Toe Great o Silver Alginate Secondary Dressing Wound #1 Right Toe Great o ABD pad o Dry Gauze o Conform/Kerlix Pevey, Philipp E. ([id number]) Wound #14 Left Toe Second o ABD pad o Dry Gauze o Conform/Kerlix Wound #2 Right Toe Second o ABD pad o Dry Gauze o  Conform/Kerlix Wound #5 Left Toe Great o ABD pad o Dry Gauze o Conform/Kerlix Dressing Change Frequency Wound #1 Right Toe Great o Change dressing every day. Wound #14 Left Toe Second o Change dressing every day. Wound #2 Right Toe Second o Change dressing every day. Wound #5 Left Toe Great o Change dressing every day. Follow-up Appointments Wound #1 Right Toe Great o Return Appointment in 1 week. Wound #14 Left Toe Second o Return Appointment in 1 week. Wound #2 Right Toe Second o Return Appointment in 1 week. Wound #5 Left Toe Great o Return Appointment in 1 week. Edema Control Wound #1 Right Toe Great o Patient to wear own compression stockings Wound #14 Left Toe Second o Patient to wear own compression stockings Wound #2 Right Toe Second o Patient to wear own compression stockings Wound #5 Left Toe Great o Patient to wear own compression stockings Additional Orders / Instructions Curl, Udell E. ([id number]) Wound #14 Left Toe Second o Stop Smoking o Increase protein intake. Patient Medications Allergies: No Known Drug Allergies Notifications Medication Indication Start End lidocaine DOSE 1 - topical 4 % cream - 1 cream topical Electronic Signature(s) Signed: 10/17/2017 9:10:59 PM  ByLawanda Cousins Signed: 10/18/2017 11:31:29 AM By: Alric Quan Entered By: Alric Quan on 10/17/2017 09:05:53 Prichard, [name] ([id number]) -------------------------------------------------------------------------------- Prescription 10/17/2017 Patient Name: [name] Provider: Lawanda Cousins NP Date of Birth: 1950/08/22 NPI#: [id number] Sex: Jerilynn Mages DEA#: [id number] Phone #: [phone] License #: Patient Address: [facility], [place] [address] [address], [place], [place] [address] [phone] Allergies No Known Drug  Allergies Medication Medication: Route: Strength: Form: lidocaine topical 4% cream Class: TOPICAL LOCAL ANESTHETICS Dose: Frequency / Time: Indication: 1 1 cream topical Number of Refills: Number of Units: 0 Generic Substitution: Start Date: End Date: Administered at Substitution Permitted Facility: Yes Time Administered: Time Discontinued: Note to Pharmacy: Signature(s): Date(s): Electronic Signature(s) Signed: 10/17/2017 9:10:59 PM By: Lawanda Cousins Signed: 10/18/2017 11:31:29 AM By: Alric Quan Entered By: Alric Quan on 10/17/2017 09:05:54 Charbonnet, Wallice E. ([id number]) Totino, Marques E. ([id number]) --------------------------------------------------------------------------------  Problem List Details Patient Name: Petersheim, Takai E. Date of Service: 10/17/2017 8:30 AM Medical Record Number: [id number] Patient Account Number: [id number] Date of Birth/Sex: [birth year] (67 y.o. M) Treating RN: Ahmed Prima Primary Care Provider: Lamonte Sakai Other Clinician: Referring Provider: Lamonte Sakai Treating Provider/Extender: Cathie Olden in Treatment: 12 Active Problems ICD-10 Evaluated Encounter Code Description Active Date Today Diagnosis E11.621 Type 2 diabetes mellitus with foot ulcer 09/21/2016 No Yes I87.313 Chronic venous hypertension (idiopathic) with ulcer of 09/21/2016 No Yes bilateral lower extremity I89.0 Lymphedema, not elsewhere classified 09/21/2016 No Yes L97.522 Non-pressure chronic ulcer of other part of left foot with fat 09/21/2016 No Yes layer exposed L97.512 Non-pressure chronic ulcer of other part of right foot with fat 09/21/2016 No Yes layer exposed F17.218 Nicotine dependence, cigarettes, with other nicotine-induced 09/21/2016 No Yes disorders F10.19 Alcohol abuse with unspecified alcohol-induced disorder 09/21/2016 No Yes M86.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, right ankle and foot 02/21/2017 No Yes Inactive Problems Resolved  Problems Electronic Signature(s) DONTRE, LADUCA ([id number]) Signed: 10/17/2017 9:21:38 AM By: Lawanda Cousins Entered By: Lawanda Cousins on 10/17/2017 09:21:37 Wendorff, [name] ([id number]) -------------------------------------------------------------------------------- Progress Note Details Patient Name: Esper, Khoen E. Date of Service: 10/17/2017 8:30 AM Medical Record Number: [id number] Patient Account Number: [id number] Date of Birth/Sex: December 30, 1950 (67 y.o. M) Treating RN: Ahmed Prima Primary Care Provider: Lamonte Sakai Other Clinician: Referring Provider: Lamonte Sakai Treating Provider/Extender: Cathie Olden in Treatment: 34 Subjective Chief Complaint Information obtained from Patient Patient presents for treatment of an open diabetic ulcer to both feet History of Present Illness (HPI) The following HPI elements were documented for the patient's wound: Location: bilateral feet ulceration on the toes Quality: Patient reports experiencing a dull pain to affected area(s). Severity: Patient states wound are getting better Duration: Patient has had the wound for > 3 months prior to seeking treatment at the wound center Timing: Pain in wound is constant (hurts all the time) Context: The wound would happen gradually Modifying Factors: Other treatment(s) tried include:treatment for lymphedema and is seen by the podiatrist Dr. Caryl Comes Associated Signs and Symptoms: Patient reports having increase swelling. 67 year old patient here to see as for bilateral feet ulceration to on his left first and second toe and 2 on his right first and second toe, which she's had for about 4 months. 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 Donella Stade. The patient is also undergoing workup by  medical oncology for a intra-abdominal lymphadenopathy. 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 He is also status post appendectomy, inguinal lymph node biopsy, rectal biopsy and rectal examination under anesthesia. he currently smokes cigarettes about half packet a day. In March of this year he was seen by Dr. Hortencia Pilar, for evaluation of bilateral varicose veins and besides wearing compression stockings he had recommended laser ablation of the right and left great saphenous veins to eleviate the symptoms and complications of severe superficial venous reflux disease. He also recommended lymphedema pumps for better control of his lymphedema. The patient recently has had on 08/23/2016, right greater saphenous vein ablation with the laser energy Earlier lower extremity venous reflux examination done on 05/08/2016 showed no DVT or SVT both lower legs but incompetence of bilateral great saphenous veins was present. A lower arterial study was also done and there was no significant right lower and left lower extremity problems based on a normal toe brachial index bilaterally and the ABI was 1.21 the left and 1.23 on the right. His post ablation venous duplex examination showed successful ablation of the right GS vein with thrombus formation 2 below the right saphenofemoral junction. The deep system was patent without evidence of thrombosis and this was done on 08/30/2016. 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 Addendum: regarding his x-rays done today and x-ray of the left foot -- IMPRESSION: No objective evidence of osteomyelitis. There are soft tissue changes which may reflect cellulitis. X-ray of the right foot -- IMPRESSION:Findings compatible with cellulitis of the toes. No objective evidence of osteomyelitis is observed. 10/01/16 on  evaluation today patient's wounds appeared to be doing some better. I did review the x-rays as well which showed no evidence of osteomyelitis although there was evidence on x-ray of cellulitis. He fortunately is not having any discomfort Barbaro, Jacquan E. ([id number]) although he continues to have some swelling. He does not remember being on any antibiotics recently. 10/15/16 on evaluation today patient's wounds overall appear to be doing better although he does have a new location noted on the left foot. Fortunately he is not having significant pain. It almost has the appearance that something is rubbing on the end of his toes but he wears the open toe shoes and according to what he is telling me never wears anything that would rub on his foot. There is no evidence of infection and specifically no evidence of a fungal infection 10/22/16 On evaluation today patient's wounds appeared to be doing better compared to last week in regard to his bilateral lower extremities. Fortunately I happy with how things are progressing although he still has ulcers I feel like that he is improving and appropriate manner. 11/12/16 on evaluation today patient appears to be doing well in regard to his bilateral feet and the respective wounds. We have been using surrounding her dressings along with an antifungal cream which seems to be doing very well. He has no bilateral dysfunction noticed that the rituals are weight loss at this point. He also has no nausea or vomiting a note purulent discharge. He did see Vein and vascular today and he tells me that they told him he could have surgery for his venous stasis but they did not feel like it was worth it in his words. Fortunately  patient's wounds do appear to be getting sneakily better. 11/26/2016 -- he says he is going to have some surgery during this week at [facility] for possibly a colon resection. 12/31/2016 -- the patient has been noncompliant with his smoking and I'm not  sure whether he is also started drinking again. He continues to be very nonchalant about his care 01/14/2017 -- the patient's HandP has been reviewed well and I understand he is being compliant with trying to give up smoking and his local dressing changes. He does not have any surgical options of 4 to him by his vascular surgeons.he was last seen in early August by Dr. Hortencia Pilar who recommended compression stockings,and possibly lymph pumps in 2-3 months after doing a review ultrasound. 01/28/2017 - the patient did not have any fresh complaints but on examination I noted a large lacerated wound on the plantar aspect of his right fourth toe which had a lot of necrotic debris and it probes down to bone. 02/07/2017 -- x-ray of the right foot -- IMPRESSION: Soft tissue swelling about the first through fourth toes consistent with cellulitis. New destructive change in the tuft of the distal phalanx of the great toe is consistent with osteomyelitis. 02/14/2017 -- the patient's MRI is pending this coming Monday and he still continues to smoke. We have again gone over off loading of his wounds in great detail and he says he's been compliant. 02/21/2017 -- MR of the right foot -- IMPRESSION: 1. Soft tissue ulcer at the tip of the first, second and third toe knows. Cortical irregularity and bone marrow edema in the first distal phalanx most concerning for osteomyelitis. Mild marrow edema in the second and third distal phalanx without definite cortical destruction which may reflect early osteomyelitis versus reactive marrow edema. 2. Soft tissue edema surrounding the first phalanx most consistent with cellulitis. the patient was also recently evaluated by his medical oncologist Dr. 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 She is treating him with weekly Procrit. She is also keeping intra-abdominal lymphadenopathy and right lower  lobe lung nodule under observation. 04/04/2017 -- he was seen by Dr. Adrian Prows on 03/25/2017 -- after review he empirically put him on ciprofloxacin and doxycycline as they have good bone penetration and good bioavailability and it will cover the usual pathogens and diabetic foot osteomyelitis. He will check inflammatory markers and plan a 42-87 week old records. C-reactive protein was 0.3 and the ESR was 72 04/18/17 on evaluation today patient appears to be doing about the same in regard to his lower extremity wounds bilaterally. He has continued to use the antifungal cream which does seem to be beneficial. Nonetheless the ulcers do seem to in some areas be epithelial eyes over and in other areas are still open. He is having no significant discomfort. 04/25/17-he is here in follow-up evaluation for multiple ulcerations to multiple toes bilaterally. He states he did see Dr. Ola Spurr again last week and continues antibiotic therapy. He is voicing no complaints or concerns, will continue with current treatment plan will possibility of adding compression therapy next week after an additional week of treatment/lotions to BLE prescribed by Dr Ola Spurr 05/02/17 he is here in follow up for for multiple ulcers to multiple toes bilaterally. we will stop using antifungal cream and will continue with silvercel and follow up next week Anschutz, Aydrian E. ([id number]) 05/09/17-he is here in follow-up for multiple ulcerations to multiple toes bilaterally.  There is improvement in appearance. He has not completely stopped using antifungal cream, but admits he has not using it between the toes. He has an appointment with Dr. Ola Spurr on 2/11, continues on doxycycline and Cipro. It has been 5 weeks of antibiotic therapy, we will order plain film xray to evaluate for osteomyelitis next week, prior to follow up with ID. Will continue with silvercel and follow up next week 05/16/17-he is here in follow-up evaluation  for multiple ulcerations to multiple toes bilaterally and new wound to the right posterior heel. There is essentially no change in appearance, deteriorating measurements; he has a history of waxing and waning measurements. He admits that he continues to apply moisturizer/cream/ointment to his toes despite weekly reminders to only apply silvercel to his toes. He states that he thinks the surgical shoe contributed to the superficial ulcer to his posterior heel, he is unable to articulate if this was an area of dry cracked skin as he has a similar area to the left heel. He now is wearing open toed slippers. He has an appointment with Dr. Ola Spurr on 2/11. We have ordered x-rays for her bilateral feet; he was advised to obtain the x-rays today or tomorrow. He will follow-up next week 05/23/17-he is here in follow-up evaluation for multiple ulcerations to multiple toes bilaterally and the right posterior heel. There is improvement in maceration. He has been compliant and not applying any moisturizing agent to his toes. He has been using Lac-Hydrin for his lower extremities with improvement. He did not go to his appointment on Monday with Dr. Ola Spurr secondary to financial concerns. X-rays for her bilateral feet showed: LEFT FOOT with slight erosion of the tuft of the distal phalanges of the left first and second toe suspicious for osteomyelitis, RIGHT FOOT with 1.erosion of the tufts of the distal phalanges of the right first second and possibly third toes consistent with osteomyelitis, 2 no definitive abnormality of the calcaneus is seen on the images obtained, 3. Plantar calcaneal degenerative spur. We briefly discussed hyperbaric adjunctive therapy for treatment of chronic refractory osteomyelitis. I do not find an a1c in EMR, will contact PCP for record, or order if needed. He has been encouraged to contact Dr Ola Spurr office regarding the follow-up appointment, encouraged him to inquire about  payment plan. We will continue with same treatment plan and follow-up next week. He states he is still taking antibiotics and has "a lot" left. He states he has been taking them as directed, 2 pills twice daily. According to Dr. Blane Ohara office notes he was originally started on 12/17 for 4 weeks and extended on 1/14 for an additional 4 weeks. He should be done with his antibiotic therapy, he was advised to bring his bottles and to his next appointment, we will contact pharmacy. 05/30/17-he is here in follow-up evaluation for multiple ulcerations to multiple toes bilaterally and the right posterior heel. He is accompanied by his brother-in-law. Wounds are stable. He has yet to make up with Dr. Ola Spurr. We contacted his PCP, with no record of recent A1c we will draw an A1c. His brother-in-law states that he was taken off all of his diabetic medication secondary to kidney function. 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 The antibiotics were counted and have approximately 2 weeks left, although they should be complete. The pill organizer reveals missing days. We discussed the need for consistent medications, to have optimal benefit  of medication. He has a cousin that lives with him and he will ask her to check his organizer daily. He has been advised to follow up with Dr Ola Spurr, and will go by the office today. He has been advised to quit smoking. 06/06/17-he is here in follow up evaluation. He has had to make an appointment with Dr. Ola Spurr. He did have blood work obtained, a1c 5. He continues to take antibiotic therapy. Significant improvement in bilateral lower extremity edema with compression therapy. Essentially no change in ulcerations to toes. He states he is "going to try something different" and "let me know next week" if it works; he would not provide any additional information  and was encouraged to follow our orders. We will follow up next week 06/13/17-he is here in follow-up evaluation. He has an appointment with Dr. Ola Spurr tomorrow morning. He states he purchased an ointment from [facility] and applied to his toes for 3 days, he does not remember the name of the ointment. There is improvement to his wounds, minimal maceration. He continues to take antibiotic therapy, this should have been completed last month. His brother-in-law who regularly accompanies his appointments was asked to take the bottles to the appointment tomorrow with Dr. Ola Spurr so he is aware. We will continue with 3 layer compression, and order OPEN TOE compression 20-30mmHg; we will apply compression stockings next week. He continues to smoke, smoked "2 cigarettes" last week 06/20/17 on evaluation today patient did receive his compression stockings which he has with him today for both lower extremities. With that being said he tells me at this point in time that he is very happy to have these he really is not a big fan of the compression wraps that we have been utilizing although they have been of great benefit for him. Nonetheless at this point he does want to switch to the compression stockings. In my opinion as long as he is continuing with compression I'm okay with the stockings or the wraps. 06/27/17-he is here in follow-up evaluation for multiple ulcerations to his bilateral toes. There is some improvement in appearance. He is compliant in wearing his compression stockings with significant improvement in lower extremity edema. He saw Dr Ola Spurr on 3/8, per his notes they would redraw ESR and CRP; plan to continue antibiotic therapy if these remain elevated. I do not see an ESR or CRP level in Epic. The patient continues to take antibiotics. 07/11/17-He is here in follow-up evaluation for multiple ulcerations to multiple toes bilaterally. He presents with complete epithelialization to the  right third toe; there has been no deterioration. He continues on antibiotic therapy. He will follow-up next week 07/18/17-He is here in follow-up evaluation for multiple ulcerations to multiple toes bilaterally. He continues to make improvement. He continues on antibiotic therapy. He states he has been using something additional to our orders, he does Vigen, Belinda E. ([id number]) not elaborate but states he will bring it in next week. 07/25/17-He is here in follow up evaluation for multiple ulcerations to bilateral toes. He is stable. He has completed antibiotic therapy. He admits to "filing" his toes after showers each evening, this is what he was referencing last week; he does not filing for the wounds. We will switch to [facility] and monitor for any improvement, he will follow-up next week 08/01/17-He is here in follow-up evaluation. He admits to "picking" at his toes after cleansing yesterday, leading to new areas of tissue loss on the bilateral second toe. There is improvement  noted to the bilateral great toe. We will dress toes today and hope that they maintain until Monday where he will come in for a nurse visit. He has been advised, multiple times with expressed verbalization, to change the dressings to silvercel if the dressings get wet prior to [facility] appointment. 08/08/17-He is here in follow-up evaluation for bilateral first and second toe ulcerations. There is significant improvement to all ulcerations since last visit. We will switch to [facility] to all wounds and he will continue with nurse visits on a Monday/Thursday schedule and follow-up with me in 2 weeks. He continues to smoke, 1-3 cigarettes per day, and has been encouraged to not smoke until his next follow-up in 2 weeks. 08/15/17 on evaluation today patient's ulcers on his toes actually appear to be doing fairly well the right to ulcers may be a little bit more moist compared to the left I'm not really sure exactly why  as the openings appear to be very small and I'm not seeing any evidence of anything significant as far as you lightest or otherwise. Nonetheless this may just be a small setback he's been doing very well with the [facility] Dressing 08/22/17-He is here in follow-up evaluation for ulcerations to his bilateral first and second toes, there is small/scant amount of drainage noted on today's dressing. He continues with Hydrofera Blue. He continues to smoke, 1 cigarette a day. Voices no complaint or concerns, compliant and compression therapy today. He'll follow-up next week 08/29/17-He is here in follow-up evaluation for ulcerations to his bilateral first and second toes, with a new wound to the left third toe. He states he cut himself while cutting the toenail. He presents today with more maceration and increased measurements; waxing and waning measurements and moisture has been an ongoing issue. He continues to smoke a proximally 1 cigarette per day, but states he has not smoked since Sunday. We will initiate medihoney daily and evaluate next week. He presents today without compression stockings 09/05/17-He is here in follow-up evaluation for ulcerations to bilateral first and second and left third toe. He did not pick up the medihoney and therefore has not been changing his dressing. He admits to inconsistent where of compression stockings, admits to pain to his bilateral lower extremities with unilateral edema (right greater than left). The ulcerations to multiple toes are more macerated and larger in size than last week. He is wearing compression therapy today. He admits to an overall feeling of weakness and fatigue and has been encouraged to contact his PCP for evaluation; he has a known history of anemia and intraabdominal lymphadenopathy. We will return to [facility] and he will follow up next week 09/19/17-He is here in follow-up evaluation for ulcerations to bilateral first and second toe.he was  unable to make last week's appointment. The culture that was obtained on 5/30 grew clindamycin sensitive MRSA; he was initiated on 6/4 but did not start it until 6/7. There is significant improvement in all wounds, healing to left second and third toe. He also admits to having no alcohol or smoking for the last 6 days. We will continue with same treatment plan I will extend the clindamycin for an additional 10 days and he will follow-up next week. 09/26/17- He is here in follow evaluation for multiple ulcerations to multiple toes bilaterally. He continues to be non-compliant with dressing, compression therapy. He states he is taking the antibiotics as prescribed, although his recall is inconsistent. There is noted deterioration in all ulcers. We  will continue same treatment plan, antibiotic therapy and he will follow up next week. 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 This information is obtained from the medical record, as he cannot articulate specifics to his diagnosis or treatment plan. 10/17/17-He is here in follow-up evaluation for multiple ulcerations to multiple toes bilaterally. There is chronic waxing and waning improvement/deterioration. On today's exam they appear improved, dry without maceration. He continues to apply a variety of topical agents, stating that he has switched between the blue product and silver product. In my opinion, this will be a chronic waxing and waning giving his intermittent compliance. He will follow-up in 2 weeks. As it relates to the biopsy taken from his buttock, he has not followed up with that and has not started radiation therapy. Patient History Information obtained from Patient. Social History Current every day smoker, Marital Status - Widowed, Alcohol Use - Daily - quit  drinking about a week ago, Drug Use - No History, Caffeine Use - Moderate. 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 ([id number]) Objective Constitutional Vitals Time Taken: 8:48 AM, Height: 69 in, Weight: 168 lbs, BMI: 24.8, Temperature: 98.2 F, Pulse: 69 bpm, Respiratory Rate: 16 breaths/min, Blood Pressure: 167/85 mmHg. Integumentary (Hair, Skin) Wound #1 status is Open. Original cause of wound was Gradually Appeared. The wound is located on the Right Toe Great. The wound measures 0.9cm length x 1cm width x 0.1cm depth; 0.707cm^2 area and 0.071cm^3 volume. Wound #14 status is Open. Original cause of wound was Gradually Appeared. The wound is located on the Left Toe Second. The wound measures 0.1cm length x 0.1cm width x 0.1cm depth; 0.008cm^2 area and 0.001cm^3 volume. Wound #15 status is Healed - Epithelialized. Original cause of wound was Gradually Appeared. The wound is located on the Left Toe Third. The wound measures 0cm length x 0cm width x 0cm depth; 0cm^2 area and 0cm^3 volume. Wound #2 status is Open. Original cause of wound was Gradually Appeared. The wound is located on the Right Toe Second. The wound measures 0.1cm length x 0.1cm width x 0.1cm depth; 0.008cm^2 area and 0.001cm^3 volume. Wound #5 status is Open. Original cause of wound was Gradually Appeared. The wound is located on the Left Toe Great. The wound measures 0.5cm length x 1.5cm width x 0.1cm depth; 0.589cm^2 area and 0.059cm^3 volume. Assessment Active Problems ICD-10 Type 2 diabetes mellitus with foot ulcer Chronic venous hypertension (idiopathic) with ulcer of bilateral lower extremity Lymphedema, not elsewhere classified Non-pressure chronic ulcer of other part of left foot with fat layer exposed Non-pressure chronic ulcer of other part of right foot with fat layer exposed Nicotine dependence, cigarettes, with other  nicotine-induced disorders Alcohol abuse with unspecified alcohol-induced disorder Chronic multifocal osteomyelitis, right ankle and foot Procedures Wound #1 Belles, Kyngston E. ([id number]) Pre-procedure diagnosis of Wound #1 is a Diabetic Wound/Ulcer of the Lower Extremity located on the Right Toe Great .Severity of Tissue Pre Debridement is: Fat layer exposed. There was a Selective/Open Wound Skin/Dermis Debridement with a total area of 0.9 sq cm performed by Lawanda Cousins, NP. With the following instrument(s): Curette to remove Viable and Non-Viable tissue/material. Material removed includes Callus, Slough, Skin: Dermis, and Fibrin/Exudate after achieving pain control using Lidocaine 4% Topical Solution. No specimens were taken.  A time out was conducted at 09:04, prior to the start of the procedure. A Minimum amount of bleeding was controlled with Pressure. The procedure was tolerated well. Patient s Level of Consciousness post procedure was recorded as Awake and Alert. Post Debridement Measurements: 0.9cm length x 1cm width x 0.2cm depth; 0.141cm^3 volume. Character of Wound/Ulcer Post Debridement requires further debridement. Severity of Tissue Post Debridement is: Fat layer exposed. Post procedure Diagnosis Wound #1: Same as Pre-Procedure Plan Wound Cleansing: Wound #1 Right Toe Great: Clean wound with Normal Saline. Wound #14 Left Toe Second: Clean wound with Normal Saline. Wound #2 Right Toe Second: Clean wound with Normal Saline. Wound #5 Left Toe Great: Clean wound with Normal Saline. Anesthetic (add to Medication List): Wound #1 Right Toe Great: Topical Lidocaine 4% cream applied to wound bed prior to debridement (In Clinic Only). Wound #14 Left Toe Second: Topical Lidocaine 4% cream applied to wound bed prior to debridement (In Clinic Only). Wound #2 Right Toe Second: Topical Lidocaine 4% cream applied to wound bed prior to debridement (In Clinic Only). Wound #5 Left Toe  Great: Topical Lidocaine 4% cream applied to wound bed prior to debridement (In Clinic Only). Primary Wound Dressing: Wound #1 Right Toe Great: Silver Alginate Wound #14 Left Toe Second: Silver Alginate Wound #2 Right Toe Second: Silver Alginate Wound #5 Left Toe Great: Silver Alginate Secondary Dressing: Wound #1 Right Toe Great: ABD pad Dry Gauze Conform/Kerlix Wound #14 Left Toe Second: ABD pad Dry Gauze Conform/Kerlix Wound #2 Right Toe Second: ABD pad Dry Gauze Conform/Kerlix Hardebeck, Lonney E. ([id number]) Wound #5 Left Toe Great: ABD pad Dry Gauze Conform/Kerlix Dressing Change Frequency: Wound #1 Right Toe Great: Change dressing every day. Wound #14 Left Toe Second: Change dressing every day. Wound #2 Right Toe Second: Change dressing every day. Wound #5 Left Toe Great: Change dressing every day. Follow-up Appointments: Wound #1 Right Toe Great: Return Appointment in 1 week. Wound #14 Left Toe Second: Return Appointment in 1 week. Wound #2 Right Toe Second: Return Appointment in 1 week. Wound #5 Left Toe Great: Return Appointment in 1 week. Edema Control: Wound #1 Right Toe Great: Patient to wear own compression stockings Wound #14 Left Toe Second: Patient to wear own compression stockings Wound #2 Right Toe Second: Patient to wear own compression stockings Wound #5 Left Toe Great: Patient to wear own compression stockings Additional Orders / Instructions: Wound #14 Left Toe Second: Stop Smoking Increase protein intake. The following medication(s) was prescribed: lidocaine topical 4 % cream 1 1 cream topical was prescribed at facility Electronic Signature(s) Signed: 10/17/2017 9:28:29 AM By: Lawanda Cousins Entered By: Lawanda Cousins on 10/17/2017 09:28:29 Fowles, [name] ([id number]) -------------------------------------------------------------------------------- ROS/PFSH Details Patient Name: Kauffman, Domnic E. Date of Service: 10/17/2017 8:30  AM Medical Record Number: [id number] Patient Account Number: [id number] Date of Birth/Sex: January 05, 1951 (67 y.o. M) Treating RN: Ahmed Prima Primary Care Provider: Lamonte Sakai Other Clinician: Referring Provider: Lamonte Sakai Treating Provider/Extender: Cathie Olden in Treatment: 3 Information Obtained From Patient Wound History Do you currently have one or more open woundso Yes How many open wounds do you currently haveo 6 Approximately how long have you had your woundso 3 months How have you been treating your wound(s) until nowo ointment and bandage Has your wound(s) ever healed and then re-openedo No Have you had any lab work done in the past montho No Have you tested positive for an antibiotic resistant organism (MRSA, VRE)o No Have you tested positive for osteomyelitis (bone  infection)o No Have you had any tests for circulation on your legso Yes Who ordered the testo PCP Where was the test doneo [facility] Medical History: Negative for: Cataracts; Glaucoma; Optic Neuritis Ear/Nose/Mouth/Throat Medical History: Negative for: Chronic sinus problems/congestion; Middle ear problems Hematologic/Lymphatic Medical History: Positive for: Anemia; Lymphedema Negative for: Hemophilia; Human Immunodeficiency Virus; Sickle Cell Disease Respiratory Medical History: Negative for: Aspiration; Asthma; Chronic Obstructive Pulmonary Disease (COPD); Pneumothorax; Sleep Apnea; Tuberculosis Cardiovascular Medical History: Positive for: Congestive Heart Failure; Hypertension; Peripheral Venous Disease Negative for: Angina; Arrhythmia; Coronary Artery Disease; Deep Vein Thrombosis; Hypotension; Myocardial Infarction; Peripheral Arterial Disease; Phlebitis; Vasculitis Gastrointestinal Medical History: Negative for: Cirrhosis ; Colitis; Crohnos; Hepatitis A; Hepatitis B; Hepatitis C Brickell, Hazel E. ([id number]) Endocrine Medical History: Positive for: Type II Diabetes Treated with:  Oral agents Blood sugar tested every day: Yes Tested : QD Genitourinary Medical History: Negative for: End Stage Renal Disease Immunological Medical History: Negative for: Lupus Erythematosus; Raynaudos; Scleroderma Integumentary (Skin) Medical History: Negative for: History of Burn; History of pressure wounds Musculoskeletal Medical History: Negative for: Gout; Rheumatoid Arthritis; Osteoarthritis; Osteomyelitis Neurologic Medical History: Positive for: Neuropathy Negative for: Dementia; Quadriplegia; Paraplegia; Seizure Disorder Oncologic Medical History: Past Medical History Notes: 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 Electronic Signature(s) Signed: 10/17/2017 9:10:59 PM By: Lawanda Cousins Signed: 10/18/2017 11:31:29 AM By: Warnell Bureau ([id number]) Entered By: Lawanda Cousins on 10/17/2017 09:28:11 [place]ield, [name] ([id number]) -------------------------------------------------------------------------------- SuperBill Details Patient Name: Fackrell, Carston E. Date of Service: 10/17/2017 Medical Record Number: [id number] Patient Account Number: [id number] Date of Birth/Sex: 01-06-1951 (67 y.o. M) Treating RN: Ahmed Prima Primary Care Provider: Lamonte Sakai Other Clinician: Referring Provider: Lamonte Sakai Treating Provider/Extender: Cathie Olden in  Treatment: 55 Diagnosis Coding ICD-10 Codes Code Description E11.621 Type 2 diabetes mellitus with foot ulcer I87.313 Chronic venous hypertension (idiopathic) with ulcer of bilateral lower extremity I89.0 Lymphedema, not elsewhere classified L97.522 Non-pressure chronic ulcer of other part of left foot with fat layer exposed L97.512 Non-pressure chronic ulcer of other part of right foot with fat layer exposed F17.218 Nicotine dependence, cigarettes, 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 abuse 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, right ankle and foot Facility Procedures CPT4 Code: [id number] Description: 00349 - DEBRIDE WOUND 1ST 20 SQ CM OR < ICD-10 Diagnosis Description L97.512 Non-pressure chronic ulcer of other part of right foot with fat la Modifier: yer exposed Quantity: 1 Physician Procedures CPT4 Code: 1791505 Description: 97597 - WC PHYS DEBR WO ANESTH 20 SQ CM ICD-10 Diagnosis Description L97.512 Non-pressure chronic ulcer of other part of right foot with fat la Modifier: yer exposed Quantity: 1 Electronic Signature(s) Signed: 10/17/2017 9:28:52 AM By: Lawanda Cousins Entered By: Lawanda Cousins on 10/17/2017 09:28:51

## 2017-10-19 NOTE — Progress Notes (Signed)
[name], [name] ([id number]) Visit Report for 10/17/2017 Arrival Information Details Patient Name: [name], [name] Date of Service: 10/17/2017 8:30 AM Medical Record Number: [id number] Patient Account Number: [id number] Date of Birth/Sex: Jan 18, 1951 (67 y.o. M) Treating RN: Montey Hora Primary Care Faaris Arizpe: Lamonte Sakai Other Clinician: Referring Carla Whilden: Lamonte Sakai Treating Jayna Mulnix/Extender: Cathie Olden in Treatment: 51 Visit Information History Since Last Visit Added or deleted any medications: No Patient Arrived: Ambulatory Any new allergies or adverse reactions: No Arrival Time: 08:47 Had a fall or experienced change in No Accompanied By: brother activities of daily living that may affect Transfer Assistance: None risk of falls: Patient Identification Verified: Yes Signs or symptoms of abuse/neglect since last visito No Secondary Verification Process Completed: Yes Hospitalized since last visit: No Patient Requires Transmission-Based No Implantable device outside of the clinic excluding No Precautions: cellular tissue based products placed in the center Patient Has Alerts: Yes since last visit: Patient Alerts: DMII Has Dressing in Place as Prescribed: Yes Has Compression in Place as Prescribed: Yes Pain Present Now: No Electronic Signature(s) Signed: 10/18/2017 10:58:55 AM By: Roger Shelter Entered By: Roger Shelter on 10/17/2017 09:09:19 Viverette, [name] ([id number]) -------------------------------------------------------------------------------- Encounter Discharge Information Details Patient Name: [name], [name] Date of Service: 10/17/2017 8:30 AM Medical Record Number: [id number] Patient Account Number: [id number] Date of Birth/Sex: 07-10-1950 (67 y.o. M) Treating RN: Roger Shelter Primary Care Naleyah Ohlinger: Lamonte Sakai Other Clinician: Referring Djuana Littleton: Lamonte Sakai Treating Joel Cowin/Extender: Cathie Olden in Treatment: 36 Encounter  Discharge Information Items Discharge Condition: Stable Ambulatory Status: Ambulatory Discharge Destination: Home Transportation: Private Auto Accompanied By: brother n law Schedule Follow-up Appointment: Yes Clinical Summary of Care: Electronic Signature(s) Signed: 10/18/2017 10:58:55 AM By: Roger Shelter Entered By: Roger Shelter on 10/17/2017 09:23:27 Nehring, [name] ([id number]) -------------------------------------------------------------------------------- Lower Extremity Assessment Details Patient Name: [name], [name] Date of Service: 10/17/2017 8:30 AM Medical Record Number: [id number] Patient Account Number: [id number] Date of Birth/Sex: 08-05-1950 (67 y.o. M) Treating RN: Montey Hora Primary Care Mishayla Sliwinski: Lamonte Sakai Other Clinician: Referring Leiana Rund: Lamonte Sakai Treating Hanna Aultman/Extender: Cathie Olden in Treatment: 55 Edema Assessment Assessed: [Left: No] [Right: No] [Left: Edema] [Right: :] Calf Left: Right: Point of Measurement: 34 cm From Medial Instep 34.7 cm 35.5 cm Ankle Left: Right: Point of Measurement: 12 cm From Medial Instep 23.6 cm 24.6 cm Vascular Assessment Pulses: Dorsalis Pedis Palpable: [Left:Yes] [Right:Yes] Posterior Tibial Extremity colors, hair growth, and conditions: Extremity Color: [Left:Hyperpigmented] [Right:Hyperpigmented] Hair Growth on Extremity: [Left:No] [Right:No] Temperature of Extremity: [Left:Warm] [Right:Warm] Capillary Refill: [Left:< 3 seconds] [Right:< 3 seconds] Electronic Signature(s) Signed: 10/17/2017 5:06:29 PM By: Montey Hora Entered By: Montey Hora on 10/17/2017 08:56:57 Eidson, [place] E. ([id number]) -------------------------------------------------------------------------------- Multi Wound Chart Details Patient Name: [name], [name] Date of Service: 10/17/2017 8:30 AM Medical Record Number: [id number] Patient Account Number: [id number] Date of Birth/Sex: Oct 20, 1950 (66 y.o. 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] Date Acquired: [1:06/11/2016] [14:09/26/2017] [15:09/26/2017] Weeks of Treatment: [1:55] [14:3] [15:3] Wound Status: [1:Open] [14:Open] [15:Healed - Epithelialized] Pending  Amputation on [1:Yes] [14:No] [15:No] Presentation: Measurements L x W x D [1:0.9x1x0.1] [14:0.1x0.1x0.1] [15:0x0x0] (cm) Area (cm) : [4:5.809] [14:0.008] [15:0] Volume (cm) : [1:0.071] [14:0.001] [15:0] % Reduction in Area: [1:98.50%] [14:99.70%] [15:100.00%] % Reduction in Volume: [1:98.50%] [14:99.60%] [15:100.00%] Classification: [1:Grade 2] [14:Grade 1] [15:Grade 1] Debridement: [1:Debridement - Excisional] [14:N/A] [15:N/A] Pre-procedure [1:09:04] [14:N/A] [15:N/A] Verification/Time Out Taken: Pain Control: [1:Lidocaine 4% Topical Solution] [14:N/A] [15:N/A] Tissue Debrided: [1:Subcutaneous, Slough] [14:N/A] [15:N/A] Level: [1:Skin/Subcutaneous Tissue] [14:N/A] [15:N/A] Debridement Area (sq cm): [1:0.9] [14:N/A] [15:N/A] Instrument: [1:Curette] [14:N/A] [15:N/A] Bleeding: [1:Minimum] [14:N/A] [15:N/A] Hemostasis Achieved: [1:Pressure] [14:N/A] [15:N/A] Debridement Treatment [1:Procedure was tolerated well] [14:N/A] [15:N/A] Response: Post Debridement [1:0.9x1x0.2] [14:N/A]  [15:N/A] Measurements L x W x D (cm) Post Debridement Volume: [1:0.141] [14:N/A] [15:N/A] (cm) Periwound Skin Texture: [1:No Abnormalities Noted] [14:No Abnormalities Noted] [15:No Abnormalities Noted] Periwound Skin Moisture: [1:No Abnormalities Noted] [14:No Abnormalities Noted] [15:No Abnormalities Noted] Periwound Skin Color: [1:No Abnormalities Noted] [14:No Abnormalities Noted] [15:No Abnormalities Noted] Tenderness on Palpation: No No No Procedures Performed: Debridement N/A N/A Wound Number: 2 5 N/A Photos: No Photos No Photos N/A Wound Location: Right Toe Second Left Toe Great N/A Wounding Event: Gradually Appeared Gradually Appeared N/A Primary Etiology: Diabetic Wound/Ulcer of the Diabetic Wound/Ulcer of the N/A Lower Extremity Lower Extremity Date Acquired: 06/11/2016 06/11/2016 N/A Weeks of Treatment: 55 55 N/A Wound Status: Open Open N/A Pending Amputation on Yes Yes N/A Presentation: Measurements L x W x D 0.1x0.1x0.1 0.5x1.5x0.1 N/A (cm) Area (cm) : 0.008 0.589 N/A Volume (cm) : 0.001 0.059 N/A % Reduction in Area: 99.70% 95.90% N/A % Reduction in Volume: 99.70% 95.90% N/A Classification: Grade 2 Grade 2 N/A Debridement: N/A N/A N/A Pain Control: N/A N/A N/A Tissue Debrided: N/A N/A N/A Level: N/A N/A N/A Debridement Area (sq cm): N/A N/A N/A Instrument: N/A N/A N/A Bleeding: N/A N/A N/A Hemostasis Achieved: N/A N/A N/A Debridement Treatment N/A N/A N/A Response: Post Debridement N/A N/A N/A Measurements L x W x D (cm) Post Debridement Volume: N/A N/A N/A (cm) Periwound Skin Texture: No Abnormalities Noted No Abnormalities Noted N/A Periwound Skin Moisture: No Abnormalities Noted No Abnormalities Noted N/A Periwound Skin Color: No Abnormalities Noted No Abnormalities Noted N/A Tenderness on Palpation: No No N/A Procedures Performed: N/A N/A N/A Treatment Notes Wound #1 (Right Toe Great) 1. Cleansed with: Clean wound with Normal Saline 2.  Anesthetic Topical Lidocaine 4% cream to wound bed prior to debridement 4. Dressing Applied: Other dressing (specify in notes) 5. Secondary Dressing Applied Dry Gauze Kerlix/Conform Notes silvercell Mungin, Shivansh E. ([id number]) Wound #14 (Left Toe Second) 1. Cleansed with: Clean wound with Normal Saline 2. Anesthetic Topical Lidocaine 4% cream to wound bed prior to debridement 4. Dressing Applied: Other dressing (specify in notes) 5. Secondary Dressing Applied Dry Gauze Kerlix/Conform Notes silvercell Wound #2 (Right Toe Second) 1. Cleansed with: Clean wound with Normal Saline 2. Anesthetic Topical Lidocaine 4% cream to wound bed prior to debridement 4. Dressing Applied: Other dressing (specify in notes) 5. Secondary Dressing Applied Dry Gauze Kerlix/Conform Notes silvercell Wound #5 (Left Toe Great) 1. Cleansed with: Clean wound with Normal Saline 2. Anesthetic Topical Lidocaine 4% cream to wound bed prior to debridement 4. Dressing Applied: Other dressing (specify in notes) 5. Secondary Dressing Applied Dry Gauze Kerlix/Conform Notes silvercell Electronic Signature(s) Signed: 10/17/2017 9:24:35 AM By: Lawanda Cousins Entered By: Lawanda Cousins on 10/17/2017 09:24:35 Connett, [name] ([id number]) -------------------------------------------------------------------------------- [place] Details Patient Name: Shinall, Jamaris E. Date of Service: 10/17/2017 8:30 AM Medical  Record Number: [id number] Patient Account Number: [id number] Date of Birth/Sex: 10/21/1950 (67 y.o. M) Treating RN: Ahmed Prima Primary Care Anakin Varkey: Lamonte Sakai Other Clinician: Referring Henrry Feil: Lamonte Sakai Treating Aracelli Woloszyn/Extender: Cathie Olden in Treatment: 58 Active Inactive ` Abuse / Safety / Falls / Self Care Management Nursing Diagnoses: Potential for falls Goals: Patient will remain injury free related to falls Date Initiated: 09/21/2016 Target  Resolution Date: 09/14/2017 Goal Status: Active Interventions: Assess fall risk on admission and as needed Notes: ` Nutrition Nursing Diagnoses: Potential for alteratiion in Nutrition/Potential for imbalanced nutrition Goals: Patient/caregiver agrees to and verbalizes understanding of need to use nutritional supplements and/or vitamins as prescribed Date Initiated: 09/21/2016 Target Resolution Date: 09/14/2017 Goal Status: Active Interventions: Assess patient nutrition upon admission and as needed per policy Notes: ` Orientation to the Wound Care Program Nursing Diagnoses: Knowledge deficit related to the wound healing center program Goals: Patient/caregiver will verbalize understanding of the [place] Date Initiated: 09/21/2016 Target Resolution Date: 06/15/2017 Goal Status: Active Interventions: DARKErma, Joubert ([id number]) Provide education on orientation to the wound center Notes: ` Wound/Skin Impairment Nursing Diagnoses: Knowledge deficit related to smoking impact on wound healing Goals: Ulcer/skin breakdown will have a volume reduction of 30% by week 4 Date Initiated: 09/21/2016 Target Resolution Date: 08/17/2017 Goal Status: Active Ulcer/skin breakdown will have a volume reduction of 50% by week 8 Date Initiated: 09/21/2016 Target Resolution Date: 08/17/2017 Goal Status: Active Ulcer/skin breakdown will have a volume reduction of 80% by week 12 Date Initiated: 09/21/2016 Target Resolution Date: 09/14/2017 Goal Status: Active Ulcer/skin breakdown will heal within 14 weeks Date Initiated: 09/21/2016 Target Resolution Date: 08/17/2017 Goal Status: Active Interventions: Assess patient/caregiver ability to obtain necessary supplies Assess patient/caregiver ability to perform ulcer/skin care regimen upon admission and as needed Assess ulceration(s) every visit Notes: Electronic Signature(s) Signed: 10/18/2017 11:31:29 AM By: Alric Quan Entered By:  Alric Quan on 10/17/2017 09:03:04 Belay, [name] ([id number]) -------------------------------------------------------------------------------- Pain Assessment Details Patient Name: Tunney, Jarmar E. Date of Service: 10/17/2017 8:30 AM Medical Record Number: [id number] Patient Account Number: [id number] Date of Birth/Sex: [birth year] (67 y.o. M) Treating RN: Montey Hora Primary Care Keia Rask: Lamonte Sakai Other Clinician: Referring Thersa Mohiuddin: Lamonte Sakai Treating Nikkita Adeyemi/Extender: Cathie Olden in Treatment: 34 Active Problems Location of Pain Severity and Description of Pain Patient Has Paino No Site Locations Pain Management and Medication Current Pain Management: Electronic Signature(s) Signed: 10/17/2017 5:06:29 PM By: Montey Hora Entered By: Montey Hora on 10/17/2017 08:48:40 Brzoska, [name] ([id number]) -------------------------------------------------------------------------------- Patient/Caregiver Education Details Patient Name: Hoen, Jerrelle E. Date of Service: 10/17/2017 8:30 AM Medical Record Number: [id number] Patient Account Number: [id number] Date of Birth/Gender: 08-30-1950 (67 y.o. M) Treating RN: Roger Shelter Primary Care Physician: Lamonte Sakai Other Clinician: Referring Physician: Lamonte Sakai Treating Physician/Extender: Cathie Olden in Treatment: 15 Education Assessment Education Provided To: Patient Education Topics Provided Wound/Skin Impairment: Handouts: Caring for Your Ulcer Methods: Explain/Verbal Responses: State content correctly Electronic Signature(s) Signed: 10/18/2017 10:58:55 AM By: Roger Shelter Entered By: Roger Shelter on 10/17/2017 09:23:44 Chicas, [name] ([id number]) -------------------------------------------------------------------------------- Wound Assessment Details Patient Name: Dadamo, Alam E. Date of Service: 10/17/2017 8:30 AM Medical Record Number: [id number] Patient Account Number:  [id number] Date of Birth/Sex: 18-Apr-1950 (67 y.o. M) Treating RN: Montey Hora Primary Care Khoi Hamberger: Lamonte Sakai Other Clinician: Referring Ledarrius Beauchaine: Lamonte Sakai Treating Terrilyn Tyner/Extender: Cathie Olden in Treatment: 55 Wound Status Wound Number: 1 Primary Diabetic Wound/Ulcer of the Lower Etiology: Extremity Wound Location: Right Toe Great Wound Status: Open Wounding  Event: Gradually Appeared Date Acquired: 06/11/2016 Weeks Of Treatment: 55 Clustered Wound: No Pending Amputation On Presentation Photos Photo Uploaded By: Montey Hora on 10/17/2017 09:54:49 Wound Measurements Length: (cm) 0.9 Width: (cm) 1 Depth: (cm) 0.1 Area: (cm) 0.707 Volume: (cm) 0.071 % Reduction in Area: 98.5% % Reduction in Volume: 98.5% Wound Description Classification: Grade 2 Periwound Skin Texture Texture Color No Abnormalities Noted: No No Abnormalities Noted: No Moisture No Abnormalities Noted: No Treatment Notes Wound #1 (Right Toe Great) 1. Cleansed with: Clean wound with Normal Saline 2. Anesthetic Topical Lidocaine 4% cream to wound bed prior to debridement Ray, Quayshaun E. ([id number]) 4. Dressing Applied: Other dressing (specify in notes) 5. Secondary Dressing Applied Dry Gauze Kerlix/Conform Notes silvercell Electronic Signature(s) Signed: 10/17/2017 5:06:29 PM By: Montey Hora Entered By: Montey Hora on 10/17/2017 08:55:22 Caras, [name] ([id number]) -------------------------------------------------------------------------------- Wound Assessment Details Patient Name: Tolen, Sarim E. Date of Service: 10/17/2017 8:30 AM Medical Record Number: [id number] Patient Account Number: [id number] Date of Birth/Sex: 1951-03-10 (67 y.o. M) Treating RN: Montey Hora Primary Care Iona Stay: Lamonte Sakai Other Clinician: Referring Irean Kendricks: Lamonte Sakai Treating Aubrianna Orchard/Extender: Cathie Olden in Treatment: 79 Wound Status Wound Number: 14 Primary Diabetic  Wound/Ulcer of the Lower Etiology: Extremity Wound Location: Left Toe Second Wound Status: Open Wounding Event: Gradually Appeared Date Acquired: 09/26/2017 Weeks Of Treatment: 3 Clustered Wound: No Photos Photo Uploaded By: Montey Hora on 10/17/2017 09:54:49 Wound Measurements Length: (cm) 0.1 Width: (cm) 0.1 Depth: (cm) 0.1 Area: (cm) 0.008 Volume: (cm) 0.001 % Reduction in Area: 99.7% % Reduction in Volume: 99.6% Wound Description Classification: Grade 1 Periwound Skin Texture Texture Color No Abnormalities Noted: No No Abnormalities Noted: No Moisture No Abnormalities Noted: No Treatment Notes Wound #14 (Left Toe Second) 1. Cleansed with: Clean wound with Normal Saline 2. Anesthetic Topical Lidocaine 4% cream to wound bed prior to debridement 4. Dressing Applied: Klang, Morrison ([id number]) Other dressing (specify in notes) 5. Secondary Dressing Applied Dry Gauze Kerlix/Conform Notes silvercell Electronic Signature(s) Signed: 10/17/2017 5:06:29 PM By: Montey Hora Entered By: Montey Hora on 10/17/2017 08:55:22 Gamm, [name] ([id number]) -------------------------------------------------------------------------------- Wound Assessment Details Patient Name: Mesa, Jaxiel E. Date of Service: 10/17/2017 8:30 AM Medical Record Number: [id number] Patient Account Number: [id number] Date of Birth/Sex: 06-Feb-1951 (67 y.o. M) Treating RN: Montey Hora Primary Care Colen Eltzroth: Lamonte Sakai Other Clinician: Referring Jashanti Clinkscale: Lamonte Sakai Treating Aiden Rao/Extender: Cathie Olden in Treatment: 79 Wound Status Wound Number: 15 Primary Diabetic Wound/Ulcer of the Lower Etiology: Extremity Wound Location: Left Toe Third Wound Status: Healed - Epithelialized Wounding Event: Gradually Appeared Date Acquired: 09/26/2017 Weeks Of Treatment: 3 Clustered Wound: No Photos Photo Uploaded By: Montey Hora on 10/17/2017 09:55:09 Wound Measurements Length:  (cm) 0 % Width: (cm) 0 % Depth: (cm) 0 Area: (cm) 0 Volume: (cm) 0 Reduction in Area: 100% Reduction in Volume: 100% Wound Description Classification: Grade 1 Periwound Skin Texture Texture Color No Abnormalities Noted: No No Abnormalities Noted: No Moisture No Abnormalities Noted: No Electronic Signature(s) Signed: 10/17/2017 5:06:29 PM By: Montey Hora Entered By: Montey Hora on 10/17/2017 08:55:22 Yum, [name] ([id number]) -------------------------------------------------------------------------------- Wound Assessment Details Patient Name: Packett, Arlene E. Date of Service: 10/17/2017 8:30 AM Medical Record Number: [id number] Patient Account Number: [id number] Date of Birth/Sex: [birth year] (67 y.o. M) Treating RN: Montey Hora Primary Care Kalub Morillo: Lamonte Sakai Other Clinician: Referring Reva Pinkley: Lamonte Sakai Treating Letzy Gullickson/Extender: Cathie Olden in Treatment: 55 Wound Status Wound Number: 2 Primary Diabetic Wound/Ulcer of the Lower Etiology: Extremity Wound Location: Right Toe  Second Wound Status: Open Wounding Event: Gradually Appeared Date Acquired: 06/11/2016 Weeks Of Treatment: 55 Clustered Wound: No Pending Amputation On Presentation Photos Photo Uploaded By: Montey Hora on 10/17/2017 09:55:10 Wound Measurements Length: (cm) 0.1 Width: (cm) 0.1 Depth: (cm) 0.1 Area: (cm) 0.008 Volume: (cm) 0.001 % Reduction in Area: 99.7% % Reduction in Volume: 99.7% Wound Description Classification: Grade 2 Periwound Skin Texture Texture Color No Abnormalities Noted: No No Abnormalities Noted: No Moisture No Abnormalities Noted: No Treatment Notes Wound #2 (Right Toe Second) 1. Cleansed with: Clean wound with Normal Saline 2. Anesthetic Topical Lidocaine 4% cream to wound bed prior to debridement Folts, Tayo E. ([id number]) 4. Dressing Applied: Other dressing (specify in notes) 5. Secondary Dressing Applied Dry  Gauze Kerlix/Conform Notes silvercell Electronic Signature(s) Signed: 10/17/2017 5:06:29 PM By: Montey Hora Entered By: Montey Hora on 10/17/2017 08:55:22 Wander, [name] ([id number]) -------------------------------------------------------------------------------- Wound Assessment Details Patient Name: Marcon, Saw E. Date of Service: 10/17/2017 8:30 AM Medical Record Number: [id number] Patient Account Number: [id number] Date of Birth/Sex: Sep 17, 1950 (67 y.o. M) Treating RN: Montey Hora Primary Care Jen Eppinger: Lamonte Sakai Other Clinician: Referring Maylen Waltermire: Lamonte Sakai Treating Keigen Caddell/Extender: Cathie Olden in Treatment: 38 Wound Status Wound Number: 5 Primary Diabetic Wound/Ulcer of the Lower Etiology: Extremity Wound Location: Left Toe Great Wound Status: Open Wounding Event: Gradually Appeared Date Acquired: 06/11/2016 Weeks Of Treatment: 55 Clustered Wound: No Pending Amputation On Presentation Photos Photo Uploaded By: Montey Hora on 10/17/2017 09:55:35 Wound Measurements Length: (cm) 0.5 Width: (cm) 1.5 Depth: (cm) 0.1 Area: (cm) 0.589 Volume: (cm) 0.059 % Reduction in Area: 95.9% % Reduction in Volume: 95.9% Wound Description Classification: Grade 2 Periwound Skin Texture Texture Color No Abnormalities Noted: No No Abnormalities Noted: No Moisture No Abnormalities Noted: No Treatment Notes Wound #5 (Left Toe Great) 1. Cleansed with: Clean wound with Normal Saline 2. Anesthetic Topical Lidocaine 4% cream to wound bed prior to debridement Lavallee, Rainier E. ([id number]) 4. Dressing Applied: Other dressing (specify in notes) 5. Secondary Dressing Applied Dry Gauze Kerlix/Conform Notes silvercell Electronic Signature(s) Signed: 10/17/2017 5:06:29 PM By: Montey Hora Entered By: Montey Hora on 10/17/2017 08:55:22 Branaman, [name] ([id number]) -------------------------------------------------------------------------------- Vitals  Details Patient Name: Cecchi, Dvon E. Date of Service: 10/17/2017 8:30 AM Medical Record Number: [id number] Patient Account Number: [id number] Date of Birth/Sex: 1950-08-01 (67 y.o. M) Treating RN: Montey Hora Primary Care Christerpher Clos: Lamonte Sakai Other Clinician: Referring Menashe Kafer: Lamonte Sakai Treating Barack Nicodemus/Extender: Cathie Olden in Treatment: 50 Vital Signs Time Taken: 08:48 Temperature (F): 98.2 Height (in): 69 Pulse (bpm): 69 Weight (lbs): 168 Respiratory Rate (breaths/min): 16 Body Mass Index (BMI): 24.8 Blood Pressure (mmHg): 167/85 Reference Range: 80 - 120 mg / dl Electronic Signature(s) Signed: 10/17/2017 5:06:29 PM By: Montey Hora Entered By: Montey Hora on 10/17/2017 08:49:52

## 2017-10-20 LAB — MULTIPLE MYELOMA PANEL, SERUM
ALPHA 1: 0.3 g/dL (ref 0.0–0.4)
ALPHA2 GLOB SERPL ELPH-MCNC: 0.8 g/dL (ref 0.4–1.0)
Albumin SerPl Elph-Mcnc: 2.6 g/dL — ABNORMAL LOW (ref 2.9–4.4)
Albumin/Glob SerPl: 0.6 — ABNORMAL LOW (ref 0.7–1.7)
B-GLOBULIN SERPL ELPH-MCNC: 1 g/dL (ref 0.7–1.3)
Gamma Glob SerPl Elph-Mcnc: 2.5 g/dL — ABNORMAL HIGH (ref 0.4–1.8)
Globulin, Total: 4.7 g/dL — ABNORMAL HIGH (ref 2.2–3.9)
IGG (IMMUNOGLOBIN G), SERUM: 2531 mg/dL — AB (ref 700–1600)
IgA: 293 mg/dL (ref 61–437)
IgM (Immunoglobulin M), Srm: 93 mg/dL (ref 20–172)
M PROTEIN SERPL ELPH-MCNC: 1.3 g/dL — AB
TOTAL PROTEIN ELP: 7.3 g/dL (ref 6.0–8.5)

## 2017-10-31 ENCOUNTER — Encounter: Payer: Medicare HMO | Admitting: Nurse Practitioner

## 2017-10-31 DIAGNOSIS — E11621 Type 2 diabetes mellitus with foot ulcer: Secondary | ICD-10-CM | POA: Diagnosis not present

## 2017-11-05 ENCOUNTER — Ambulatory Visit (INDEPENDENT_AMBULATORY_CARE_PROVIDER_SITE_OTHER): Payer: Medicare HMO | Admitting: General Surgery

## 2017-11-05 ENCOUNTER — Encounter: Payer: Self-pay | Admitting: General Surgery

## 2017-11-05 VITALS — BP 124/62 | HR 72 | Resp 12 | Ht 72.0 in | Wt 184.0 lb

## 2017-11-05 DIAGNOSIS — C44529 Squamous cell carcinoma of skin of other part of trunk: Secondary | ICD-10-CM | POA: Diagnosis not present

## 2017-11-05 DIAGNOSIS — C4491 Basal cell carcinoma of skin, unspecified: Secondary | ICD-10-CM

## 2017-11-05 NOTE — Progress Notes (Signed)
Patient ID: [name], male   DOB: June 24, 1950, 67 y.o.   MRN: [id number]  Chief Complaint  Patient presents with  . Other    HPI [name] is a 67 y.o. male here today to discuss sugery for perianal mass. He states the area is not drainng but itching.   HPI  Past Medical History:  Diagnosis Date  . Anemia   . Cancer ([place]) 11/02/2014   PERIANAL MASS, RIGHT; INCISIONAL BIOPSY: INVASIVE CARCINOMA WITH BASALOID FEATURES  . CHF (congestive heart failure) ([place])   . Diabetes mellitus    Patient take Metformin.  Marland Kitchen GERD (gastroesophageal reflux disease)   . Hypertension   . Leg swelling   . Loose, teeth    PATIENT STATES "HAS 6 TEETH, SOME ARE LOOSE"  . Neuromuscular disorder ([place])    RIGHT HAND AND FINGERS NUMB  . Shortness of breath dyspnea   . 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 APPENDECTOMY    . COLONOSCOPY WITH PROPOFOL N/A 11/19/2014   Procedure: COLONOSCOPY WITH PROPOFOL;  Surgeon: Lucilla Lame, MD;  Location: [facility];  Service: Endoscopy;  Laterality: N/A;  DIABETIC-ORAL MEDS  . COLONOSCOPY WITH PROPOFOL N/A 10/04/2016   Procedure: COLONOSCOPY WITH PROPOFOL;  Surgeon: Jonathon Bellows, MD;  Location: [facility] ENDOSCOPY;  Service: Endoscopy;  Laterality: N/A;  . ESOPHAGOGASTRODUODENOSCOPY (EGD) WITH PROPOFOL N/A 10/04/2016   Procedure: ESOPHAGOGASTRODUODENOSCOPY (EGD) WITH PROPOFOL;  Surgeon: Jonathon Bellows, MD;  Location: [facility] ENDOSCOPY;  Service: Endoscopy;  Laterality: N/A;  . GIVENS CAPSULE STUDY N/A 11/13/2016   Procedure: GIVENS CAPSULE STUDY;  Surgeon: Jonathon Bellows, MD;  Location: [facility] ENDOSCOPY;  Service: Gastroenterology;  Laterality: N/A;  . INGUINAL LYMPH NODE BIOPSY Right 01/26/2016   Procedure: INGUINAL LYMPH NODE BIOPSY;  Surgeon: Clayburn Pert, MD;  Location: [facility] ORS;  Service: General;  Laterality: Right;  . RECTAL BIOPSY N/A 11/02/2014   Procedure: BIOPSY RECTAL;  Surgeon: Marlyce Huge, MD;  Location: [facility] ORS;  Service: General;  Laterality: N/A;  . RECTAL EXAM UNDER ANESTHESIA N/A 11/02/2014   Procedure: RECTAL EXAM UNDER ANESTHESIA;  Surgeon: Marlyce Huge, MD;  Location: [facility] ORS;  Service: General;  Laterality: N/A;  . TONSILLECTOMY      Family History  Problem Relation Age of Onset  . Asthma Mother   . Colon cancer Neg Hx     Social History Social History   Tobacco Use  . Smoking status: Current Every Day Smoker    Packs/day: 0.25    Years: 20.00    Pack years: 5.00    Types: Cigarettes  . Smokeless tobacco: Never Used  . Tobacco comment: pt claims no smoking x 3 d  Substance Use Topics  . Alcohol use: No    Comment: per pt used to drink beer daily- quit may/2018  . Drug use: Yes    Types: Marijuana    Comment: Last Use 2016 per patient    No Known Allergies  Current Outpatient Medications  Medication Sig Dispense Refill  . amLODipine (NORVASC) 10 MG tablet Take 10 mg by mouth daily.    Marland Kitchen aspirin EC 81 MG tablet Take 81 mg by mouth daily.    Marland Kitchen atorvastatin (LIPITOR) 40 MG tablet Take 40 mg by mouth at bedtime.    . bismuth subsalicylate (PEPTO BISMOL) 262 MG/15ML suspension Take 30 mLs by mouth every 6 (six) hours as needed for indigestion or diarrhea or loose stools.     Marland Kitchen  chlorthalidone (HYGROTON) 25 MG tablet Take 25 mg by mouth daily.     . ferrous sulfate 325 (65 FE) MG tablet Take 325 mg by mouth daily.     . hydrALAZINE (APRESOLINE) 25 MG tablet Take 25 mg 2 (two) times daily by mouth.    Marland Kitchen omeprazole (PRILOSEC) 40 MG capsule Take 40 mg by mouth daily.     . polyethylene glycol (MIRALAX / GLYCOLAX) packet Take 17 g by mouth daily as needed for mild constipation.     . vitamin B-12 (CYANOCOBALAMIN) 1000 MCG tablet Take 1 tablet (1,000 mcg total) by mouth daily. 30 tablet 3  . Vitamin D, Ergocalciferol, (DRISDOL) 50000 units CAPS capsule Take 50,000 Units by mouth every 7 (seven) days. On Thursdays     No current  facility-administered medications for this visit.    Facility-Administered Medications Ordered in Other Visits  Medication Dose Route Frequency Provider Last Rate Last Dose  . Darbepoetin Alfa (ARANESP) injection 100 mcg  100 mcg Subcutaneous Q30 days Sindy Guadeloupe, MD   100 mcg at 07/25/17 0924  . Darbepoetin Alfa (ARANESP) injection 100 mcg  100 mcg Subcutaneous Q30 days Sindy Guadeloupe, MD   100 mcg at 08/22/17 1015  . epoetin alfa (EPOGEN,PROCRIT) injection 40,000 Units  40,000 Units Subcutaneous Weekly Sindy Guadeloupe, MD   40,000 Units at 05/23/17 1145    Review of Systems Review of Systems  Constitutional: Negative.   Respiratory: Negative.   Cardiovascular: Negative.     Blood pressure 124/62, pulse 72, resp. rate 12, height 6' (1.829 m), weight 184 lb (83.5 kg).  Physical Exam Physical Exam  Constitutional: He is oriented to person, place, and time. He appears well-developed and well-nourished.  HENT:  Mouth/Throat: No oropharyngeal exudate.  Eyes: Conjunctivae are normal. No scleral icterus.  Neck: Neck supple.  Cardiovascular: Normal rate, regular rhythm and normal heart sounds.  Pulmonary/Chest: Effort normal and breath sounds normal.  Neurological: He is alert and oriented to person, place, and time.  Skin: Skin is warm and dry.  Psychiatric: His behavior is normal.    Data Reviewed November 02, 2014 incisional biopsy of right gluteal mass  PERIANAL MASS, RIGHT; INCISIONAL BIOPSY:  - INVASIVE CARCINOMA WITH BASALOID FEATURES.  - SEE COMMENT.   Comment #1:  The differential diagnosis for the perianal polyp includes pyogenic  granuloma and ulcerated hemangioma.   Comment #2:  Immunohistochemical stains were performed. The carcinoma is positive for  p16, BerEp4, and BCL-2. Stain controls worked appropriately.  Unfortunately the pattern of staining does not assist with further  classification of the carcinoma. Clinical correlation is required to  determine if the  lesion involves perianal skin primarily (BCC) or is  arising in the anal canal (squamous cell carcinoma with basaloid  features).   Radiation oncology consultation note of February 14, 2016 describe plans to administer 5000 Gy of radiation to the area of the right gluteal basaloid carcinoma prior to consideration for excision. Examination of obvious 21, 2018 showed the area had resolved.  October 03, 2017 examination suggested recurrence. Confirmed on recent biopsy.    Most recent CBC of October 17, 2017 showed a hemoglobin of 8.6 with an MCVof  88, white blood cell count 5800, platelet count of 210,000.  Comprehensive metabolic panel of September 19, 2017 showed a creatinine of 2.71, increased from 1.88 obtained 3 months earlier.  Elevated blood sugar 164.  Mild hyponatremia 133.  Stable elevation of SGPT.  Estimated GFR of 27.  Hematology  note of September 19, 2017 with Dr. Janese Banks reviewed.  Anemia of chronic disease complicated by alcoholic cirrhosis.  Previous upper and lower endoscopy negative.  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  Hepatic cirrhosis with ascites.   History lower extremity edema with ulceration.      Plan    Indications for surgical excision reviewed.  Potential for wound healing in light of previous radiation discussed.  May require local rotation flap.     HPI, Physical Exam, Assessment and Plan have been scribed under the direction and in the presence of Robert Bellow, MD. Karie Fetch, RN  The patient is scheduled for surgery at [facility] on 11/18/17. He will pre admit by phone. The patient is aware of date and instructions.  Documented by Lesly Rubenstein LPN  I have completed the exam and reviewed the above documentation for accuracy and completeness.  I agree with the above.  [employer] has been used and any errors in dictation or transcription are unintentional.  Hervey Ard, M.D.,  F.A.C.S. . Forest Gleason Haylie Mccutcheon 11/07/2017, 5:22 AM

## 2017-11-05 NOTE — Patient Instructions (Addendum)
Schedule surgery  The patient is aware to call back for any questions or new concerns.  The patient is scheduled for surgery at [facility] on 11/18/17. He will pre admit by phone. The patient is aware of date and instructions.

## 2017-11-08 ENCOUNTER — Encounter
Admission: RE | Admit: 2017-11-08 | Discharge: 2017-11-08 | Disposition: A | Discharge status: Home / Self Care | Payer: Medicare HMO | Source: Ambulatory Visit | Attending: General Surgery | Admitting: General Surgery

## 2017-11-08 ENCOUNTER — Other Ambulatory Visit: Payer: Self-pay

## 2017-11-08 HISTORY — DX: Chronic obstructive pulmonary disease, unspecified: J44.9

## 2017-11-08 HISTORY — DX: Unspecified cirrhosis of liver: K74.60

## 2017-11-08 NOTE — Pre-Procedure Instructions (Signed)
Echo complete6/04/2016 [place] Component Name Value Ref Range  LV Ejection Fraction (%) 55   Aortic Valve Stenosis Grade none   Aortic Valve Regurgitation Grade mild   Aortic Valve Max Velocity (m/s) 2 m/sec   Mitral Valve Stenosis Grade none   Mitral Valve Regurgitation Grade mild   Tricuspid Valve Regurgitation Grade mild   Tricuspid Valve Regurgitation Max Velocity (m/s) 2.7 m/sec   Right Ventricle Systolic Pressure (mmHg) 73.7 mmHg   LV End Diastolic Diameter (cm) 4.8 cm  LV End Systolic Diameter (cm) 3.3 cm  LV Septum Wall Thickness (cm) 1.3 cm  LV Posterior Wall Thickness (cm) 1.2 cm  Left Atrium Diameter (cm) 4.1 cm  Result Narrative   [facility] [id number]  Davy #: [id number]  [address] [place], [place] [address] Date: 09/07/2016 08:06 AM Adult Male Age: 67 yrs  ECHOCARDIOGRAM REPORTOutpatient KC::KCWC  STUDY:CHEST WALL TAPE: MD1:PARASCHOS, ALEXANDER ECHO:Yes DOPPLER:YesFILE: BP: 152/82 mmHg  COLOR:YesCONTRAST:NoMACHINE:PhilipsHeight: 69 in  RV BIOPSY:No 3D:No SOUND QLTY:Moderate Weight: 177 lb MEDIUM:None BSA: 2.0 m2  ___________________________________________________________________________________________  HISTORY:DOE REASON:Assess, LV function INDICATION:SOB (shortness of breath) on exertion [R06.02 (ICD-10-CM)], ___________________________________________________________________________________________  ECHOCARDIOGRAPHIC  MEASUREMENTS 2D DIMENSIONS AORTA ValuesNormal RangeMAIN PAValuesNormal Range Annulus:1.9 cm[2.3 - 2.9]PA Main:nm* [1.5 - 2.1] Aorta Sin:3.2 cm[3.1 - 3.7] RIGHT VENTRICLE ST Junction:nm* [2.6 - 3.2]RV Base:nm* [ < 4.2] Asc.Aorta:nm* [2.6 - 3.4] RV Mid:nm* [ < 3.5]  LEFT VENTRICLERV Length:2.8 cm[ < 8.6] LVIDd:4.8 cm[4.2 - 5.9] INFERIOR VENA CAVA LVIDs:3.3 cmMax. IVC:nm* [ <= 2.1]  FS:30.3 %[> 25]Min. IVC:nm* SWT:1.3 cm[0.6 - 1.0] ------------------ PWT:1.2 cm[0.6 - 1.0] nm* - not measured  LEFT ATRIUM LA Diam:4.1 cm[3.0 - 4.0] LA A4C Area:nm* [ < 20] LA Volume:nm* Roice.Felt - 58] ___________________________________________________________________________________________  ECHOCARDIOGRAPHIC DESCRIPTIONS  AORTIC ROOT Size:Normal Dissection:INDETERM FOR DISSECTION  AORTIC VALVE Leaflets:Tricuspid Morphology:MILDLY THICKENED Mobility:Fully mobile  LEFT VENTRICLE Size:NormalAnterior:Normal  Contraction:Normal Lateral:Normal Closest EF:>55% (Estimated)Septal:Normal  LV Masses:No Masses Apical:Normal  OJJ:KKXF LVHInferior:Normal Posterior:Normal Dias.FxClass:N/A  MITRAL VALVE Leaflets:NormalMobility:Fully mobile Morphology:ANNULAR CALC  LEFT ATRIUM Size:MILDLY ENLARGEDLA Masses:No masses  IA Septum:Normal IAS  MAIN PA Size:Normal  PULMONIC  VALVE Morphology:NormalMobility:Fully mobile  RIGHT VENTRICLE  RV Masses:No Masses Size:Normal  Free Wall:Normal Contraction:Normal  TRICUSPID VALVE Leaflets:NormalMobility:Fully mobile Morphology:Normal  RIGHT ATRIUM Size:NormalRA Other:None  RA Mass:No masses  PERICARDIUM  Fluid:No effusion  INFERIOR VENACAVA Size:Normal Not Seen  _____________________________________________________________________  DOPPLER ECHO and OTHER SPECIAL PROCEDURES  Aortic:MILD ARNo AS 195.5 cm/sec peak vel15.3 mmHg peak grad   Mitral:MILD MRNo MS  3.6 cm^2 by DOPPLER MV Inflow E Vel=132.0 cm/sec MV Annulus E'Vel=6.5 cm/sec E/E'Ratio=20.3  Tricuspid:MILD TRNo TS 274.2 cm/sec peak TR vel 35.1 mmHg peak RV pressure  Pulmonary:MILD PRNo PS 89.6 cm/sec peak vel 3.2 mmHg peak grad    ___________________________________________________________________________________________  INTERPRETATION NORMAL LEFT VENTRICULAR SYSTOLIC FUNCTION WITH MILD LVH NORMAL RIGHT VENTRICULAR SYSTOLIC FUNCTION MILD VALVULAR REGURGITATION (See above) NO VALVULAR STENOSIS MILD to MODERATE AR MILD PHTN EF 60%  ___________________________________________________________________________________________  Electronically signed by: MD Miquel Dunn on 09/07/2016 10:05 AM Performed By: Maurilio Lovely, RDCS Ordering Physician: Isaias Cowman ___________________________________________________________________________________________  Status Results Details   Unavailable

## 2017-11-08 NOTE — Patient Instructions (Signed)
Your procedure is scheduled on: 11-18-17 MONDAY Report to Same Day Surgery 2nd floor medical mall [facility] [place] Entrance-take elevator on left to 2nd floor.  Check in with surgery information desk.) To find out your arrival time please call [phone] between 1PM - 3PM on 11-15-17 FRIDAY  Remember: Instructions that are not followed completely may result in serious medical risk, up to and including death, or upon the discretion of your surgeon and anesthesiologist your surgery may need to be rescheduled.    _x___ 1. Do not eat food after midnight the night before your procedure. You may drink WATER up to 2 hours before you are scheduled to arrive at the hospital for your procedure.  Do not drink WATER within 2 hours of your scheduled arrival to the hospital.  Type 1 and type 2 diabetics should only drink water.   ____Ensure clear carbohydrate drink on the way to the hospital for bariatric patients  ____Ensure clear carbohydrate drink 3 hours before surgery for Dr Dwyane Luo patients if physician instructed.   No gum chewing or hard candies.     __x__ 2. No Alcohol for 24 hours before or after surgery.   __x__3. No Smoking or e-cigarettes for 24 prior to surgery.  Do not use any chewable tobacco products for at least 6 hour prior to surgery   ____  4. Bring all medications with you on the day of surgery if instructed.    __x__ 5. Notify your doctor if there is any change in your medical condition     (cold, fever, infections).    x___6. On the morning of surgery brush your teeth with toothpaste and water.  You may rinse your mouth with mouth wash if you wish.  Do not swallow any toothpaste or mouthwash.   Do not wear jewelry, make-up, hairpins, clips or nail polish.  Do not wear lotions, powders, or perfumes. You may wear deodorant.  Do not shave 48 hours prior to surgery. Men may shave face and neck.  Do not bring valuables to the hospital.    [facility] is not responsible for any  belongings or valuables.               Contacts, dentures or bridgework may not be worn into surgery.  Leave your suitcase in the car. After surgery it may be brought to your room.  For patients admitted to the hospital, discharge time is determined by your treatment team.  _  Patients discharged the day of surgery will not be allowed to drive home.  You will need someone to drive you home and stay with you the night of your procedure.    Please read over the following fact sheets that you were given:   [facility] Preparing for Surgery   _x___ TAKE THE FOLLOWING MEDICATION THE MORNING OF SURGERY WITH A SMALL SIP OF WATER. These include:  1. AMLODIPINE (NORVASC)  2. ATORVASTATIN (LIPITOR)  3. HYDRALAZINE  4. OMEPRAZOLE (PRILOSEC)  5. TAKE AN EXTRA PRILOSEC THE NIGHT BEFORE YOUR SURGERY  6.  ____Fleets enema or Magnesium Citrate as directed.   _x___ Use CHG Soap or sage wipes as directed on instruction sheet   ____ Use inhalers on the day of surgery and bring to hospital day of surgery  ____ Stop Metformin and Janumet 2 days prior to surgery.    ____ Take 1/2 of usual insulin dose the night before surgery and none on the morning surgery.   _X___ Follow recommendations from Cardiologist,  Pulmonologist or PCP regarding stopping Aspirin, Coumadin, Plavix ,Eliquis, Effient, or Pradaxa, and Pletal-OK TO CONTINUE 81 MG ASPIRIN-DO NOT TAKE DAY OF SURGERY  ____Stop Anti-inflammatories such as Advil, Aleve, Ibuprofen, Motrin, Naproxen, Naprosyn, Goodies powders or aspirin products. OK to take Tylenol    ____ Stop supplements until after surgery.     ____ Bring C-Pap to the hospital.

## 2017-11-08 NOTE — Pre-Procedure Instructions (Signed)
NM myocardial perfusion SPECT multiple (stress and rest)09/07/2016 [place] Result Impression   1.Normal left ventricular function 2.Normal wall motion 3.Mild inferior wall ischemia  Result Narrative  CARDIOLOGY DEPARTMENT [facility] [place], [id number] [phone]  Procedure: Pharmacologic Myocardial Perfusion Imaging ONE day procedure  Indication: SOB (shortness of breath) on exertion Plan: NM myocardial perfusion SPECT multiple (stress  and rest)  Chest pain with high risk for cardiac etiology, unspecified Plan: NM myocardial perfusion SPECT multiple (stress  and rest), ECG stress test only  SOB (shortness of breath) Plan: ECG stress test only  Ordering Physician:   Dr. Isaias Cowman   Clinical History: 67 y.o. year old male Vitals: Height: 70 in Weight: 177 lb Cardiac risk factors include:  Smoking, Hyperlipidemia, Diabetes and HTN    Procedure:  Pharmacologic stress testing was performed with Regadenoson using a single  use 0.4mg /65ml (0.08 mg/ml) prefilled syringe intravenously infused as a  bolus dose. The stress test was stopped due to Infusion completion.Blood  pressure response was normal. The patient did not develop any symptoms  other than fatigue during the procedure.   Rest HR: 81bpm Rest BP: 160/66mmHg Max HR: 87bpm Min BP: 178/110mmHg  Stress Test Administered by: Oswald Hillock, CMA  ECG Interpretation: Rest YIA:XKPVVZ sinus rhythm, none Stress SMO:LMBEML sinus rhythm,  Recovery JQG:BEEFEO sinus rhythm ECG Interpretation:non-diagnostic due to pharmacologic testing.   Administrations This Visit  regadenoson (LEXISCAN) 0.4 mg/5 mL inj syringe 0.4 mg  Admin Date 09/07/2016 Action Given Dose 0.4 mg Route Intravenous Administered By Ane Payment, CNMT     technetium Tc61m sestamibi (CARDIOLITE) injection 71.21  millicurie  Admin Date 97/58/8325 Action Given Dose 49.82 millicurie Route Intravenous Administered By Elaina Pattee, RT (N)     technetium Tc50m sestamibi (CARDIOLITE) injection 6.41 millicurie  Admin Date 58/30/9407 Action Given Dose 6.80 millicurie Route Intravenous Administered By Ane Payment, CNMT       Gated post-stress perfusion imaging was performed 30 minutes after stress.  Rest images were performed 30 minutes after injection.  Gated LV Analysis:  TID Ratio:   LVEF= 59%  FINDINGS: Regional wall motion:reveals normal myocardial thickening and wall  motion. The overall quality of the study is good. Artifacts noted: no Left ventricular cavity: normal.  Perfusion Analysis:SPECT images demonstrate small perfusion abnormality  of mild intensity is present in the inferior region on the stress images.  Status Results Details   Unavailable

## 2017-11-08 NOTE — Pre-Procedure Instructions (Signed)
Progress Notes - documented in this encounter  [name] Cowman, MD - 06/11/2017 9:15 AM EST Formatting of this note might be different from the original. Established Patient Visit   Chief Complaint: Chief Complaint  Patient presents with  . Follow-up  6 months  . Edema  Date of Service: 06/11/2017 Date of Birth: [birth year] PCP: McLean-Scocuzza, Ginger Organ, MD  History of Present Illness: Mr. [name] is a 67 y.o.male patient who returns for chest pain, exertional dyspnea, essential hypertension, type 2 diabetes and COPD. The patient was seen on 08/13/2016 with recent history of chest pain, with typical and atypical features. The patient complained of exertional dyspnea due to underlying COPD and ongoing tobacco abuse. The patient continues to smoke less than half pack of cigarettes per day. The patient returns today, denies chest pain. He denies palpitations or heart racing. He has chronic peripheral edema, currently followed at the wound center once a week. The patient is not active and does not exercise regularly. 2D echocardiogram 09/07/2016 revealed LVEF greater than 55% with mild mitral and tricuspid regurgitation. [place] study 09/07/2016 revealed LVEF of 59% with mild inferior wall ischemia.  The patient has essential hypertension, blood pressure mildly elevated, currently on amlodipine, lisinopril, torsemide, and Spironolactone, which are well tolerated without apparent side effects. Has not taken his blood pressure medications today. The patient follows a low-sodium, no added salt diet.  Past Medical and Surgical History  Past Medical History Past Medical History:  Diagnosis Date  . Anemia, unspecified  . Chronic venous insufficiency  . Cirrhosis (CMS-HCC)  . Diabetes mellitus type 2, uncomplicated (CMS-HCC)  . Hyperlipidemia, unspecified  . Hypertension  . SCC (squamous cell carcinoma)  left   Past Surgical History He has a past surgical history that includes boil;  buttocks; Colonoscopy; and colonoscopy w/endoscopic ultrasound (N/A, 11/29/2016).   Medications and Allergies  Current Medications  Current Outpatient Medications  Medication Sig Dispense Refill  . ciprofloxacin HCl (CIPRO) 500 MG tablet Take 500 mg by mouth 2 (two) times daily  . doxycycline (VIBRAMYCIN) 100 MG capsule Take 100 mg by mouth 2 (two) times daily  . amLODIPine (NORVASC) 10 MG tablet Take 10 mg by mouth once daily.   Marland Kitchen aspirin 325 MG tablet Take 325 mg by mouth once daily.  Marland Kitchen atorvastatin (LIPITOR) 40 MG tablet Take 40 mg by mouth once daily  . chlorthalidone 25 MG tablet Take 25 mg by mouth once daily.  . ferrous sulfate 325 (65 FE) MG tablet Take 325 mg by mouth daily with breakfast.  . hydrALAZINE (APRESOLINE) 25 MG tablet Take 25 mg by mouth 2 (two) times daily  . omeprazole (PRILOSEC) 40 MG DR capsule Take 40 mg by mouth once daily.  Marland Kitchen triamcinolone 0.1 % ointment Apply topically 2 (two) times daily 80 g 1   No current facility-administered medications for this visit.   Allergies: Patient has no known allergies.  Social and Family History  Social History reports that he has been smoking cigarettes. He has a 22.50 pack-year smoking history. He has quit using smokeless tobacco. He reports that he drinks alcohol. He reports that he does not use drugs.  Family History Family History  Problem Relation Age of Onset  . No Known Problems Sister  . No Known Problems Daughter  . No Known Problems Son   Review of Systems   Review of Systems: The patient denies chest pain, shortness of breath, orthopnea, paroxysmal nocturnal dyspnea, pedal edema, palpitations, heart racing, presyncope, syncope. Review of  12 Systems is negative except as described above.  Physical Examination   Vitals:BP (!) 162/92  Pulse 74  Ht 175.3 cm (5\' 9" )  Wt 80.7 kg (178 lb)  BMI 26.29 kg/m  Ht:175.3 cm (5\' 9" ) Wt:80.7 kg (178 lb) RSW:NIOE surface area is 1.98 meters squared. Body mass index  is 26.29 kg/m.  HEENT: Pupils equally reactive to light and accomodation  Neck: Supple without thyromegaly, carotid pulses 2+ Lungs: clear to auscultation bilaterally; no wheezes, rales, rhonchi Heart: Regular rate and rhythm. No gallops, murmurs or rub Abdomen: soft nontender, nondistended, with normal bowel sounds Extremities: no cyanosis, clubbing, or edema Peripheral Pulses: 2+ in all extremities, 2+ femoral pulses bilaterally  Assessment   67 y.o. male with  1. Essential hypertension  2. Chronic venous insufficiency  3. Type 2 diabetes mellitus with complication, without long-term current use of insulin , unspecified (CMS-HCC)  4. SOB (shortness of breath) on exertion  5. Lymphedema   67 year old gentleman seen for evaluation of chest pain, with atypical features, without recurrence, with normal left ventricular function on 2D echocardiogram, and mild inferior wall ischemia observed on Lexiscan Myoview study. The patient has essential hypertension, blood pressure elevated on current BP medications, however the patient did not take his medications today.  Plan   1. Continue current medications 2. Counseled patient about low-sodium diet 3. DASH diet printed instructions given to the patient 4. Counseled patient about low-cholesterol diet 5. Low-fat and cholesterol diet printed instructions given to the patient 6. Encourage patient to stop smoking 7. Defer cardiac catheterization  8. Return to clinic for follow-up in 6 months  No orders of the defined types were placed in this encounter.  Return in about 6 months (around 12/12/2017).  [name] Cowman, MD PhD [facility]    Electronically signed by [name] Cowman, MD at 06/11/2017 9:58 AM EST

## 2017-11-11 ENCOUNTER — Encounter
Admission: RE | Admit: 2017-11-11 | Discharge: 2017-11-11 | Disposition: A | Discharge status: Home / Self Care | Payer: Medicare HMO | Source: Ambulatory Visit | Attending: General Surgery | Admitting: General Surgery

## 2017-11-11 DIAGNOSIS — Z01818 Encounter for other preprocedural examination: Secondary | ICD-10-CM | POA: Diagnosis not present

## 2017-11-11 DIAGNOSIS — C4491 Basal cell carcinoma of skin, unspecified: Secondary | ICD-10-CM | POA: Diagnosis present

## 2017-11-11 DIAGNOSIS — I493 Ventricular premature depolarization: Secondary | ICD-10-CM | POA: Insufficient documentation

## 2017-11-11 DIAGNOSIS — I10 Essential (primary) hypertension: Secondary | ICD-10-CM | POA: Insufficient documentation

## 2017-11-11 DIAGNOSIS — E119 Type 2 diabetes mellitus without complications: Secondary | ICD-10-CM | POA: Diagnosis not present

## 2017-11-11 DIAGNOSIS — Z01810 Encounter for preprocedural cardiovascular examination: Secondary | ICD-10-CM | POA: Diagnosis not present

## 2017-11-11 HISTORY — DX: Chronic kidney disease, unspecified: N18.9

## 2017-11-11 LAB — COMPREHENSIVE METABOLIC PANEL
ALT: 21 U/L (ref 0–44)
ANION GAP: 6 (ref 5–15)
AST: 33 U/L (ref 15–41)
Albumin: 2.8 g/dL — ABNORMAL LOW (ref 3.5–5.0)
Alkaline Phosphatase: 68 U/L (ref 38–126)
BILIRUBIN TOTAL: 0.7 mg/dL (ref 0.3–1.2)
BUN: 34 mg/dL — ABNORMAL HIGH (ref 8–23)
CO2: 22 mmol/L (ref 22–32)
Calcium: 8 mg/dL — ABNORMAL LOW (ref 8.9–10.3)
Chloride: 109 mmol/L (ref 98–111)
Creatinine, Ser: 2 mg/dL — ABNORMAL HIGH (ref 0.61–1.24)
GFR, EST AFRICAN AMERICAN: 39 mL/min — AB (ref >60)
GFR, EST NON AFRICAN AMERICAN: 33 mL/min — AB (ref >60)
Glucose, Bld: 144 mg/dL — ABNORMAL HIGH (ref 70–99)
Potassium: 4.5 mmol/L (ref 3.5–5.1)
Sodium: 137 mmol/L (ref 135–145)
TOTAL PROTEIN: 7 g/dL (ref 6.5–8.1)

## 2017-11-11 LAB — CBC WITH DIFFERENTIAL/PLATELET
BASOS PCT: 1 %
Basophils Absolute: 0 10*3/uL (ref 0–0.1)
EOS PCT: 5 %
Eosinophils Absolute: 0.3 10*3/uL (ref 0–0.7)
HCT: 22.3 % — ABNORMAL LOW (ref 40.0–52.0)
Hemoglobin: 7.4 g/dL — ABNORMAL LOW (ref 13.0–18.0)
Lymphocytes Relative: 15 %
Lymphs Abs: 0.9 10*3/uL — ABNORMAL LOW (ref 1.0–3.6)
MCH: 28.5 pg (ref 26.0–34.0)
MCHC: 33.3 g/dL (ref 32.0–36.0)
MCV: 85.8 fL (ref 80.0–100.0)
MONOS PCT: 11 %
Monocytes Absolute: 0.6 10*3/uL (ref 0.2–1.0)
Neutro Abs: 3.9 10*3/uL (ref 1.4–6.5)
Neutrophils Relative %: 68 %
PLATELETS: 209 10*3/uL (ref 150–440)
RBC: 2.6 MIL/uL — ABNORMAL LOW (ref 4.40–5.90)
RDW: 15.2 % — ABNORMAL HIGH (ref 11.5–14.5)
WBC: 5.8 10*3/uL (ref 3.8–10.6)

## 2017-11-11 LAB — PROTIME-INR
INR: 1.07
PROTHROMBIN TIME: 13.8 s (ref 11.4–15.2)

## 2017-11-14 ENCOUNTER — Encounter: Payer: Medicare HMO | Attending: Nurse Practitioner | Admitting: Nurse Practitioner

## 2017-11-14 ENCOUNTER — Inpatient Hospital Stay: Payer: Medicare HMO

## 2017-11-14 ENCOUNTER — Other Ambulatory Visit: Payer: Self-pay | Admitting: Oncology

## 2017-11-14 ENCOUNTER — Inpatient Hospital Stay: Payer: Medicare HMO | Attending: Oncology

## 2017-11-14 VITALS — BP 157/80 | HR 74

## 2017-11-14 DIAGNOSIS — Z923 Personal history of irradiation: Secondary | ICD-10-CM | POA: Insufficient documentation

## 2017-11-14 DIAGNOSIS — Z85828 Personal history of other malignant neoplasm of skin: Secondary | ICD-10-CM | POA: Insufficient documentation

## 2017-11-14 DIAGNOSIS — Z79899 Other long term (current) drug therapy: Secondary | ICD-10-CM | POA: Insufficient documentation

## 2017-11-14 DIAGNOSIS — M86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, right ankle and foot: Secondary | ICD-10-CM | POA: Diagnosis not present

## 2017-11-14 DIAGNOSIS — I130 Hypertensive heart and chronic kidney disease with heart failure and stage 1 through stage 4 chronic kidney disease, or unspecified chronic kidney disease: Secondary | ICD-10-CM | POA: Insufficient documentation

## 2017-11-14 DIAGNOSIS — K219 Gastro-esophageal reflux disease without esophagitis: Secondary | ICD-10-CM | POA: Diagnosis not present

## 2017-11-14 DIAGNOSIS — Z9889 Other specified postprocedural states: Secondary | ICD-10-CM | POA: Diagnosis not present

## 2017-11-14 DIAGNOSIS — I87323 Chronic venous hypertension (idiopathic) with inflammation of bilateral lower extremity: Secondary | ICD-10-CM | POA: Insufficient documentation

## 2017-11-14 DIAGNOSIS — L97521 Non-pressure chronic ulcer of other part of left foot limited to breakdown of skin: Secondary | ICD-10-CM | POA: Insufficient documentation

## 2017-11-14 DIAGNOSIS — D631 Anemia in chronic kidney disease: Secondary | ICD-10-CM | POA: Diagnosis not present

## 2017-11-14 DIAGNOSIS — I509 Heart failure, unspecified: Secondary | ICD-10-CM | POA: Diagnosis not present

## 2017-11-14 DIAGNOSIS — E1122 Type 2 diabetes mellitus with diabetic chronic kidney disease: Secondary | ICD-10-CM | POA: Diagnosis not present

## 2017-11-14 DIAGNOSIS — L97511 Non-pressure chronic ulcer of other part of right foot limited to breakdown of skin: Secondary | ICD-10-CM | POA: Diagnosis not present

## 2017-11-14 DIAGNOSIS — J90 Pleural effusion, not elsewhere classified: Secondary | ICD-10-CM | POA: Insufficient documentation

## 2017-11-14 DIAGNOSIS — E1169 Type 2 diabetes mellitus with other specified complication: Secondary | ICD-10-CM | POA: Diagnosis not present

## 2017-11-14 DIAGNOSIS — F1019 Alcohol abuse with unspecified alcohol-induced disorder: Secondary | ICD-10-CM | POA: Insufficient documentation

## 2017-11-14 DIAGNOSIS — I890 Lymphedema, not elsewhere classified: Secondary | ICD-10-CM | POA: Diagnosis not present

## 2017-11-14 DIAGNOSIS — D472 Monoclonal gammopathy: Secondary | ICD-10-CM | POA: Diagnosis not present

## 2017-11-14 DIAGNOSIS — F17210 Nicotine dependence, cigarettes, uncomplicated: Secondary | ICD-10-CM | POA: Insufficient documentation

## 2017-11-14 DIAGNOSIS — E11621 Type 2 diabetes mellitus with foot ulcer: Secondary | ICD-10-CM | POA: Diagnosis present

## 2017-11-14 DIAGNOSIS — N189 Chronic kidney disease, unspecified: Principal | ICD-10-CM

## 2017-11-14 DIAGNOSIS — K7460 Unspecified cirrhosis of liver: Secondary | ICD-10-CM | POA: Insufficient documentation

## 2017-11-14 DIAGNOSIS — D509 Iron deficiency anemia, unspecified: Secondary | ICD-10-CM

## 2017-11-14 LAB — CBC
HEMATOCRIT: 21.9 % — AB (ref 40.0–52.0)
HEMOGLOBIN: 7.2 g/dL — AB (ref 13.0–18.0)
MCH: 28.8 pg (ref 26.0–34.0)
MCHC: 32.8 g/dL (ref 32.0–36.0)
MCV: 87.9 fL (ref 80.0–100.0)
Platelets: 260 10*3/uL (ref 150–440)
RBC: 2.49 MIL/uL — AB (ref 4.40–5.90)
RDW: 15.3 % — ABNORMAL HIGH (ref 11.5–14.5)
WBC: 8.4 10*3/uL (ref 3.8–10.6)

## 2017-11-14 MED ORDER — NON FORMULARY: 50000.0000 [IU] | Status: DC

## 2017-11-14 MED ORDER — EPOETIN ALFA-EPBX 40000 UNIT/ML IJ SOLN
40000.0000 [IU] | Freq: Once | INTRAMUSCULAR | Status: AC
  Administered 2017-11-14: 40000 [IU] via SUBCUTANEOUS
  Filled 2017-11-14: qty 1

## 2017-11-14 MED ORDER — EPOETIN ALFA-EPBX 10000 UNIT/ML IJ SOLN
10000.0000 [IU] | Freq: Once | INTRAMUSCULAR | Status: AC
  Administered 2017-11-14: 10000 [IU] via SUBCUTANEOUS
  Filled 2017-11-14: qty 1

## 2017-11-16 ENCOUNTER — Other Ambulatory Visit: Payer: Self-pay

## 2017-11-16 ENCOUNTER — Emergency Department: Payer: Medicare HMO

## 2017-11-16 DIAGNOSIS — K5900 Constipation, unspecified: Secondary | ICD-10-CM | POA: Insufficient documentation

## 2017-11-16 DIAGNOSIS — K219 Gastro-esophageal reflux disease without esophagitis: Secondary | ICD-10-CM | POA: Insufficient documentation

## 2017-11-16 DIAGNOSIS — D472 Monoclonal gammopathy: Secondary | ICD-10-CM | POA: Diagnosis not present

## 2017-11-16 DIAGNOSIS — I130 Hypertensive heart and chronic kidney disease with heart failure and stage 1 through stage 4 chronic kidney disease, or unspecified chronic kidney disease: Secondary | ICD-10-CM | POA: Insufficient documentation

## 2017-11-16 DIAGNOSIS — N183 Chronic kidney disease, stage 3 (moderate): Secondary | ICD-10-CM | POA: Diagnosis not present

## 2017-11-16 DIAGNOSIS — D631 Anemia in chronic kidney disease: Secondary | ICD-10-CM | POA: Diagnosis not present

## 2017-11-16 DIAGNOSIS — E1122 Type 2 diabetes mellitus with diabetic chronic kidney disease: Secondary | ICD-10-CM | POA: Insufficient documentation

## 2017-11-16 DIAGNOSIS — K922 Gastrointestinal hemorrhage, unspecified: Secondary | ICD-10-CM | POA: Diagnosis present

## 2017-11-16 DIAGNOSIS — E1165 Type 2 diabetes mellitus with hyperglycemia: Secondary | ICD-10-CM | POA: Insufficient documentation

## 2017-11-16 DIAGNOSIS — D509 Iron deficiency anemia, unspecified: Secondary | ICD-10-CM | POA: Diagnosis not present

## 2017-11-16 DIAGNOSIS — F17210 Nicotine dependence, cigarettes, uncomplicated: Secondary | ICD-10-CM | POA: Diagnosis not present

## 2017-11-16 DIAGNOSIS — Z79899 Other long term (current) drug therapy: Secondary | ICD-10-CM | POA: Diagnosis not present

## 2017-11-16 DIAGNOSIS — Z7982 Long term (current) use of aspirin: Secondary | ICD-10-CM | POA: Diagnosis not present

## 2017-11-16 DIAGNOSIS — R079 Chest pain, unspecified: Secondary | ICD-10-CM | POA: Diagnosis present

## 2017-11-16 DIAGNOSIS — K7030 Alcoholic cirrhosis of liver without ascites: Secondary | ICD-10-CM | POA: Insufficient documentation

## 2017-11-16 DIAGNOSIS — I509 Heart failure, unspecified: Secondary | ICD-10-CM | POA: Insufficient documentation

## 2017-11-16 DIAGNOSIS — D649 Anemia, unspecified: Secondary | ICD-10-CM | POA: Diagnosis present

## 2017-11-16 DIAGNOSIS — J449 Chronic obstructive pulmonary disease, unspecified: Secondary | ICD-10-CM | POA: Diagnosis not present

## 2017-11-16 LAB — BASIC METABOLIC PANEL
Anion gap: 6 (ref 5–15)
BUN: 28 mg/dL — AB (ref 8–23)
CHLORIDE: 109 mmol/L (ref 98–111)
CO2: 21 mmol/L — AB (ref 22–32)
Calcium: 8.4 mg/dL — ABNORMAL LOW (ref 8.9–10.3)
Creatinine, Ser: 2.04 mg/dL — ABNORMAL HIGH (ref 0.61–1.24)
GFR calc Af Amer: 38 mL/min — ABNORMAL LOW (ref >60)
GFR calc non Af Amer: 33 mL/min — ABNORMAL LOW (ref >60)
GLUCOSE: 159 mg/dL — AB (ref 70–99)
POTASSIUM: 4.1 mmol/L (ref 3.5–5.1)
Sodium: 136 mmol/L (ref 135–145)

## 2017-11-16 LAB — CBC
HEMATOCRIT: 21.1 % — AB (ref 40.0–52.0)
HEMOGLOBIN: 7 g/dL — AB (ref 13.0–18.0)
MCH: 29.3 pg (ref 26.0–34.0)
MCHC: 33.2 g/dL (ref 32.0–36.0)
MCV: 88.4 fL (ref 80.0–100.0)
Platelets: 240 10*3/uL (ref 150–440)
RBC: 2.39 MIL/uL — ABNORMAL LOW (ref 4.40–5.90)
RDW: 15.8 % — AB (ref 11.5–14.5)
WBC: 8.6 10*3/uL (ref 3.8–10.6)

## 2017-11-16 LAB — GLUCOSE, CAPILLARY: Glucose-Capillary: 152 mg/dL — ABNORMAL HIGH (ref 70–99)

## 2017-11-16 LAB — TROPONIN I: Troponin I: <0.03 ng/mL (ref <0.03)

## 2017-11-16 NOTE — ED Triage Notes (Signed)
Reports chest pain off/on for 2 days.  Reports last time it hurt like this his "sugar" was up.

## 2017-11-17 ENCOUNTER — Observation Stay
Admission: EM | Admit: 2017-11-17 | Discharge: 2017-11-18 | Disposition: A | Discharge status: Home / Self Care | Payer: Medicare HMO | Attending: Internal Medicine | Admitting: Internal Medicine

## 2017-11-17 DIAGNOSIS — D631 Anemia in chronic kidney disease: Secondary | ICD-10-CM | POA: Diagnosis not present

## 2017-11-17 DIAGNOSIS — K922 Gastrointestinal hemorrhage, unspecified: Secondary | ICD-10-CM

## 2017-11-17 DIAGNOSIS — D509 Iron deficiency anemia, unspecified: Secondary | ICD-10-CM

## 2017-11-17 DIAGNOSIS — E11621 Type 2 diabetes mellitus with foot ulcer: Secondary | ICD-10-CM

## 2017-11-17 DIAGNOSIS — F17210 Nicotine dependence, cigarettes, uncomplicated: Secondary | ICD-10-CM

## 2017-11-17 DIAGNOSIS — L97509 Non-pressure chronic ulcer of other part of unspecified foot with unspecified severity: Secondary | ICD-10-CM

## 2017-11-17 DIAGNOSIS — K7469 Other cirrhosis of liver: Secondary | ICD-10-CM

## 2017-11-17 DIAGNOSIS — D649 Anemia, unspecified: Secondary | ICD-10-CM | POA: Diagnosis present

## 2017-11-17 DIAGNOSIS — I509 Heart failure, unspecified: Secondary | ICD-10-CM

## 2017-11-17 DIAGNOSIS — D472 Monoclonal gammopathy: Secondary | ICD-10-CM

## 2017-11-17 DIAGNOSIS — N183 Chronic kidney disease, stage 3 (moderate): Secondary | ICD-10-CM | POA: Diagnosis not present

## 2017-11-17 DIAGNOSIS — R079 Chest pain, unspecified: Secondary | ICD-10-CM

## 2017-11-17 DIAGNOSIS — C4491 Basal cell carcinoma of skin, unspecified: Secondary | ICD-10-CM

## 2017-11-17 DIAGNOSIS — E1165 Type 2 diabetes mellitus with hyperglycemia: Secondary | ICD-10-CM

## 2017-11-17 LAB — TSH: TSH: 4.031 u[IU]/mL (ref 0.350–4.500)

## 2017-11-17 LAB — RETICULOCYTES
RBC.: 2.96 MIL/uL — AB (ref 4.40–5.90)
RETIC COUNT ABSOLUTE: 171.7 10*3/uL (ref 19.0–183.0)
Retic Ct Pct: 5.8 % — ABNORMAL HIGH (ref 0.4–3.1)

## 2017-11-17 LAB — PREPARE RBC (CROSSMATCH)

## 2017-11-17 LAB — LACTATE DEHYDROGENASE: LDH: 163 U/L (ref 98–192)

## 2017-11-17 MED ORDER — SODIUM CHLORIDE 0.9 % IV SOLN
8.0000 mg/h | INTRAVENOUS | Status: DC
  Administered 2017-11-17: 8 mg/h via INTRAVENOUS
  Filled 2017-11-17: qty 80

## 2017-11-17 MED ORDER — SODIUM CHLORIDE 0.9% FLUSH
3.0000 mL | Freq: Two times a day (BID) | INTRAVENOUS | Status: DC
  Administered 2017-11-17 (×2): 3 mL via INTRAVENOUS

## 2017-11-17 MED ORDER — FERROUS SULFATE 325 (65 FE) MG PO TABS
325.0000 mg | ORAL_TABLET | Freq: Every day | ORAL | Status: DC
  Administered 2017-11-17 – 2017-11-18 (×2): 325 mg via ORAL
  Filled 2017-11-17 (×2): qty 1

## 2017-11-17 MED ORDER — ONDANSETRON HCL 4 MG PO TABS: 4.0000 mg | ORAL_TABLET | Freq: Four times a day (QID) | ORAL | Status: DC | PRN

## 2017-11-17 MED ORDER — SODIUM CHLORIDE 0.9 % IV SOLN
10.0000 mL/h | Freq: Once | INTRAVENOUS | Status: AC
  Administered 2017-11-17: 10 mL/h via INTRAVENOUS

## 2017-11-17 MED ORDER — ACETAMINOPHEN 325 MG PO TABS: 650.0000 mg | ORAL_TABLET | Freq: Four times a day (QID) | ORAL | Status: DC | PRN

## 2017-11-17 MED ORDER — HYDRALAZINE HCL 50 MG PO TABS
25.0000 mg | ORAL_TABLET | Freq: Three times a day (TID) | ORAL | Status: DC
  Administered 2017-11-17 – 2017-11-18 (×3): 25 mg via ORAL
  Filled 2017-11-17 (×3): qty 1

## 2017-11-17 MED ORDER — VITAMIN B-12 1000 MCG PO TABS
1000.0000 ug | ORAL_TABLET | Freq: Every day | ORAL | Status: DC
  Administered 2017-11-17 – 2017-11-18 (×2): 1000 ug via ORAL
  Filled 2017-11-17 (×2): qty 1

## 2017-11-17 MED ORDER — LABETALOL HCL 5 MG/ML IV SOLN: 10.0000 mg | INTRAVENOUS | Status: DC | PRN

## 2017-11-17 MED ORDER — AMLODIPINE BESYLATE 10 MG PO TABS
10.0000 mg | ORAL_TABLET | ORAL | Status: DC
  Administered 2017-11-17 – 2017-11-18 (×2): 10 mg via ORAL
  Filled 2017-11-17 (×2): qty 1

## 2017-11-17 MED ORDER — CHLORTHALIDONE 25 MG PO TABS
25.0000 mg | ORAL_TABLET | ORAL | Status: DC
  Administered 2017-11-17: 09:00:00 25 mg via ORAL
  Filled 2017-11-17 (×2): qty 1

## 2017-11-17 MED ORDER — DOCUSATE SODIUM 100 MG PO CAPS
100.0000 mg | ORAL_CAPSULE | Freq: Two times a day (BID) | ORAL | Status: DC
  Administered 2017-11-17 – 2017-11-18 (×4): 100 mg via ORAL
  Filled 2017-11-17 (×4): qty 1

## 2017-11-17 MED ORDER — ASPIRIN EC 81 MG PO TBEC
81.0000 mg | DELAYED_RELEASE_TABLET | Freq: Every day | ORAL | Status: DC
  Administered 2017-11-17: 81 mg via ORAL
  Filled 2017-11-17: qty 1

## 2017-11-17 MED ORDER — HYDRALAZINE HCL 50 MG PO TABS
25.0000 mg | ORAL_TABLET | Freq: Two times a day (BID) | ORAL | Status: DC
  Administered 2017-11-17 (×2): 25 mg via ORAL
  Filled 2017-11-17 (×2): qty 1

## 2017-11-17 MED ORDER — VITAMIN D (ERGOCALCIFEROL) 1.25 MG (50000 UNIT) PO CAPS: 50000.0000 [IU] | ORAL_CAPSULE | ORAL | Status: DC

## 2017-11-17 MED ORDER — SODIUM CHLORIDE 0.9 % IV SOLN
80.0000 mg | Freq: Once | INTRAVENOUS | Status: AC
  Administered 2017-11-17: 80 mg via INTRAVENOUS
  Filled 2017-11-17: qty 80

## 2017-11-17 MED ORDER — SODIUM CHLORIDE 0.9% FLUSH
3.0000 mL | INTRAVENOUS | Status: DC | PRN
  Administered 2017-11-17: 3 mL via INTRAVENOUS
  Filled 2017-11-17: qty 3

## 2017-11-17 MED ORDER — LABETALOL HCL 5 MG/ML IV SOLN: 5.0000 mg | INTRAVENOUS | Status: DC | PRN

## 2017-11-17 MED ORDER — ONDANSETRON HCL 4 MG/2ML IJ SOLN: 4.0000 mg | Freq: Four times a day (QID) | INTRAMUSCULAR | Status: DC | PRN

## 2017-11-17 MED ORDER — POLYETHYLENE GLYCOL 3350 17 G PO PACK: 17.0000 g | PACK | Freq: Every day | ORAL | Status: DC | PRN

## 2017-11-17 MED ORDER — PANTOPRAZOLE SODIUM 40 MG PO TBEC
40.0000 mg | DELAYED_RELEASE_TABLET | Freq: Two times a day (BID) | ORAL | Status: DC
  Administered 2017-11-17 – 2017-11-18 (×2): 40 mg via ORAL
  Filled 2017-11-17 (×2): qty 1

## 2017-11-17 MED ORDER — ACETAMINOPHEN 650 MG RE SUPP: 650.0000 mg | Freq: Four times a day (QID) | RECTAL | Status: DC | PRN

## 2017-11-17 MED ORDER — ATORVASTATIN CALCIUM 20 MG PO TABS
40.0000 mg | ORAL_TABLET | ORAL | Status: DC
  Administered 2017-11-17 – 2017-11-18 (×2): 40 mg via ORAL
  Filled 2017-11-17 (×2): qty 2

## 2017-11-17 NOTE — Care Management Obs Status (Signed)
[facility] NOTIFICATION   Patient Details  Name: [name] MRN: [id number] Date of Birth: 1950/12/07   Medicare Observation Status Notification Given:  Yes    [name] Riesen A [name], RN 11/17/2017, 9:32 AM

## 2017-11-17 NOTE — Progress Notes (Signed)
The patient has no complaints.  He is getting PRBC transfusion. His vital signs are stable.  Is a 67 year old male admitted for anemia. 1.  Anemia: Multifactorial including MGUS, chronic kidney disease, chronic disease and possibly GI losses.  The latter have been explored extensively in the past.  Notably the patient has recurrent gastritis regularly takes Pepto-Bismol and iron which can cause positive Hemoccult stools.  He has had EGD and colonoscopy in the past which have not shown polyps, lesions or bleeding vessels.  Follow-up hemoglobin after PRBC transfusion.  2.  Melena: No gross blood reported however the patient is Hemoccult positive. Follow-up GI consult. 3.  Hypertension: continue amlodipine, hydralazine and chlorthalidone.  Labetalol as needed. 4.  CKD: Stage III; hydrate with intravenous fluid.  Avoid nephrotoxic agents. 5.  DVT prophylaxis: SCDs 6.  GI prophylaxis: Changed to p.o. Protonix. Tobacco abuse.  Smoking cessation was counseled for 4 minutes.  I discussed with the patient and RN.  Time spent about 26 minutes.

## 2017-11-17 NOTE — Consult Note (Addendum)
[facility] GI Inpatient Consult Note   Kathline Magic, M.D.  Reason for Consult: Hemoccult positive stool, anemia   Attending Requesting Consult: Demetrios Loll, M.D.  Outpatient Primary Physician: Lamonte Sakai, M.D.  History of Present Illness: [name] is a 67 y.o. male with a history of basaloid carcinoma of the perineum.  He is scheduled for surgical resection tomorrow per Dr. Hervey Ard.  Patient originally presented because of diffuse pain in his back abdomen and chest.  Pain is now resolving.  He was also noted to have profound anemia with hemoglobin of 7.  Baseline hemoglobin is somewhere between 7.8 and 9 and is thought to be related to MGUS.  Patient denies any gross melena although he has had some [place] stools from taking his oral iron supplement and occasional Pepto-Bismol.  He has chronic recurrent constipation which he manages with MiraLAX and occasional magnesium citrate.  He has had no complaints of hematochezia. The patient underwent EGD and colonoscopy 620 06/08/2016 by Dr. Jonathon Bellows of [place] [facility].  At that time the patient had mild nonerosive gastritis with H. pylori negative.  Cecal polyp was also noted that was removed with the pathology of which just revealed fecal material.  Patient underwent a endoscopic ultrasound of the colon in 2018 by [facility] revealing only vascular malformations in the ascending colon and cecum.  No malignancies were identified.  Patient also had another remote colonoscopy in 2016 by Dr. Lucilla Lame of [place] [facility] which was essentially normal with only internal hemorrhoids noted. I have been asked to see the patient today revealing some anemia without any overt gastrointestinal bleeding and noted to be Hemoccult positive on digital rectal examination.  Past Medical History:  Past Medical History:  Diagnosis Date  . Anemia   . Cancer ([place]) 11/02/2014   PERIANAL MASS, RIGHT; INCISIONAL BIOPSY:  INVASIVE CARCINOMA WITH BASALOID FEATURES  . CHF (congestive heart failure) ([place])   . Chronic kidney disease   . COPD (chronic obstructive pulmonary disease) ([place])    NO INHALERS  . Diabetes mellitus    NO MEDS  . GERD (gastroesophageal reflux disease)   . Hepatic cirrhosis ([place])   . Hypertension   . Leg swelling   . Loose, teeth    PATIENT STATES "HAS 6 TEETH, SOME ARE LOOSE"  . Neuromuscular disorder ([place])    RIGHT HAND AND FINGERS NUMB  . Shortness of breath dyspnea    WITH EXERTION   . 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 Mass of perianal area 10/16/2017  . Mass of pharynx   . Polyp of cecum   . Iron deficiency anemia 09/19/2016  . Chest pain with high risk for cardiac etiology 08/13/2016  . SOB (shortness of breath) on exertion 08/13/2016  . Varicose veins of both lower extremities with inflammation 06/11/2016  . Essential hypertension 06/11/2016  . Chronic venous insufficiency 05/08/2016  . Type 2 diabetes mellitus with complication, without long-term current use of insulin ([place]) 05/08/2016  . Lymphedema 04/12/2016  . Ulcer of great toe ([place]) 04/12/2016  . 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 Anemia 01/26/2016  . Skin mass   . Lymphadenopathy, inguinal   . Symptomatic anemia 01/24/2016  . Hypoglycemia 09/13/2015  . Internal hemorrhoids 12/30/2014  . Mass of anus 11/01/2014    Past Surgical History: Past Surgical History:  Procedure Laterality Date  .  APPENDECTOMY    . COLONOSCOPY WITH PROPOFOL N/A 11/19/2014   Procedure: COLONOSCOPY WITH PROPOFOL;  Surgeon: Lucilla Lame, MD;  Location: [facility];  Service: Endoscopy;  Laterality: N/A;  DIABETIC-ORAL MEDS  . COLONOSCOPY WITH PROPOFOL N/A 10/04/2016   Procedure: COLONOSCOPY WITH PROPOFOL;  Surgeon: Jonathon Bellows, MD;  Location: [facility] ENDOSCOPY;  Service: Endoscopy;  Laterality: N/A;  .  ESOPHAGOGASTRODUODENOSCOPY (EGD) WITH PROPOFOL N/A 10/04/2016   Procedure: ESOPHAGOGASTRODUODENOSCOPY (EGD) WITH PROPOFOL;  Surgeon: Jonathon Bellows, MD;  Location: [facility] ENDOSCOPY;  Service: Endoscopy;  Laterality: N/A;  . GIVENS CAPSULE STUDY N/A 11/13/2016   Procedure: GIVENS CAPSULE STUDY;  Surgeon: Jonathon Bellows, MD;  Location: [facility] ENDOSCOPY;  Service: Gastroenterology;  Laterality: N/A;  . INGUINAL LYMPH NODE BIOPSY Right 01/26/2016   Procedure: INGUINAL LYMPH NODE BIOPSY;  Surgeon: Clayburn Pert, MD;  Location: [facility] ORS;  Service: General;  Laterality: Right;  . RECTAL BIOPSY N/A 11/02/2014   Procedure: BIOPSY RECTAL;  Surgeon: Marlyce Huge, MD;  Location: [facility] ORS;  Service: General;  Laterality: N/A;  . RECTAL EXAM UNDER ANESTHESIA N/A 11/02/2014   Procedure: RECTAL EXAM UNDER ANESTHESIA;  Surgeon: Marlyce Huge, MD;  Location: [facility] ORS;  Service: General;  Laterality: N/A;  . TONSILLECTOMY      Allergies: No Known Allergies  Home Medications: Medications Prior to Admission  Medication Sig Dispense Refill Last Dose  . amLODipine (NORVASC) 10 MG tablet Take 10 mg by mouth every morning.    11/16/2017 at Unknown time  . aspirin EC 81 MG tablet Take 81 mg by mouth daily.   11/16/2017 at Unknown time  . atorvastatin (LIPITOR) 40 MG tablet Take 40 mg by mouth every morning.    11/16/2017 at Unknown time  . bismuth subsalicylate (PEPTO BISMOL) 262 MG/15ML suspension Take 30 mLs by mouth every 6 (six) hours as needed for indigestion or diarrhea or loose stools.    prn at prn  . chlorthalidone (HYGROTON) 25 MG tablet Take 25 mg by mouth every morning.    11/16/2017 at Unknown time  . ferrous sulfate 325 (65 FE) MG tablet Take 325 mg by mouth daily.    11/16/2017 at Unknown time  . hydrALAZINE (APRESOLINE) 25 MG tablet Take 25 mg 2 (two) times daily by mouth.   11/16/2017 at Unknown time  . omeprazole (PRILOSEC) 40 MG capsule Take 40 mg by mouth every morning.    11/16/2017 at Unknown time   . polyethylene glycol (MIRALAX / GLYCOLAX) packet Take 17 g by mouth daily as needed for mild constipation.    prn at prn  . vitamin B-12 (CYANOCOBALAMIN) 1000 MCG tablet Take 1 tablet (1,000 mcg total) by mouth daily. 30 tablet 3 11/16/2017 at Unknown time  . Vitamin D, Ergocalciferol, (DRISDOL) 50000 units CAPS capsule Take 50,000 Units by mouth every 7 (seven) days. On Thursdays   Past Week at Unknown time   Home medication reconciliation was completed with the patient.   Scheduled Inpatient Medications:   . amLODipine  10 mg Oral BH-q7a  . atorvastatin  40 mg Oral BH-q7a  . chlorthalidone  25 mg Oral BH-q7a  . docusate sodium  100 mg Oral BID  . ferrous sulfate  325 mg Oral Daily  . hydrALAZINE  25 mg Oral Q8H  . pantoprazole  40 mg Oral BID AC  . sodium chloride flush  3 mL Intravenous Q12H  . vitamin B-12  1,000 mcg Oral Daily  . [START ON 11/21/2017] Vitamin D (Ergocalciferol)  50,000 Units Oral Q7 days  Continuous Inpatient Infusions:    PRN Inpatient Medications:  acetaminophen **OR** acetaminophen, labetalol, ondansetron **OR** ondansetron (ZOFRAN) IV, polyethylene glycol, sodium chloride flush  Family History: family history includes Asthma in his mother.   GI Family History: Negative.  Social History:   reports that he has been smoking cigarettes. He has a 10.00 pack-year smoking history. He has never used smokeless tobacco. He reports that he drank alcohol. He reports that he has current or past drug history. Drug: Marijuana. The patient denies ETOH, tobacco, or drug use.    Review of Systems: Review of Systems - History obtained from the patient General ROS: positive for  - fatigue Psychological ROS: negative ENT ROS: negative Allergy and Immunology ROS: negative Hematological and Lymphatic ROS: positive for - bruising Endocrine ROS: negative Respiratory ROS: no cough, shortness of breath, or wheezing Cardiovascular ROS: no chest pain or dyspnea on  exertion Genito-Urinary ROS: no dysuria, trouble voiding, or hematuria Musculoskeletal ROS: positive for - joint stiffness and muscular weakness Neurological ROS: no TIA or stroke symptoms Dermatological ROS: negative  Physical Examination: BP (!) 155/76 (BP Location: Right Arm)   Pulse 68   Temp 98.6 F (37 C) (Oral)   Resp 20   Ht 6' (1.829 m)   Wt 84.3 kg   SpO2 100%   BMI 25.21 kg/m  Physical Exam  Constitutional: He is oriented to person, place, and time. He appears well-developed and well-nourished.  HENT:  Head: Normocephalic.  Eyes: Pupils are equal, round, and reactive to light.  Neck: Normal range of motion. Neck supple.  Cardiovascular: Regular rhythm and normal pulses. Exam reveals no S3 and no S4.  Pulmonary/Chest: No accessory muscle usage. He has decreased breath sounds. He has wheezes in the right upper field and the left upper field. He has no rhonchi. He has no rales.  Abdominal: Soft. Bowel sounds are normal. He exhibits no mass. There is no tenderness. There is no guarding.  Musculoskeletal: Normal range of motion.  Neurological: He is alert and oriented to person, place, and time.  Skin: Skin is warm and dry.  Psychiatric: He has a normal mood and affect.    Data: Lab Results  Component Value Date   WBC 8.6 11/16/2017   HGB 7.0 (L) 11/16/2017   HCT 21.1 (L) 11/16/2017   MCV 88.4 11/16/2017   PLT 240 11/16/2017   Recent Labs  Lab 11/11/17 0830 11/14/17 0909 11/16/17 2108  HGB 7.4* 7.2* 7.0*   Lab Results  Component Value Date   NA 136 11/16/2017   K 4.1 11/16/2017   CL 109 11/16/2017   CO2 21 (L) 11/16/2017   BUN 28 (H) 11/16/2017   CREATININE 2.04 (H) 11/16/2017   Lab Results  Component Value Date   ALT 21 11/11/2017   AST 33 11/11/2017   ALKPHOS 68 11/11/2017   BILITOT 0.7 11/11/2017   Recent Labs  Lab 11/11/17 0830  INR 1.07   CBC Latest Ref Rng & Units 11/16/2017 11/14/2017 11/11/2017  WBC 3.8 - 10.6 K/uL 8.6 8.4 5.8   Hemoglobin 13.0 - 18.0 g/dL 7.0(L) 7.2(L) 7.4(L)  Hematocrit 40.0 - 52.0 % 21.1(L) 21.9(L) 22.3(L)  Platelets 150 - 440 K/uL 240 260 209    STUDIES: Dg Chest 2 View  Result Date: 11/16/2017 CLINICAL DATA:  Acute onset of intermittent generalized chest pain. EXAM: CHEST - 2 VIEW COMPARISON:  Chest radiograph performed 01/23/2016 FINDINGS: The lungs are well-aerated. Vascular congestion is noted. Mild left basilar atelectasis or scarring is noted,  stable from 2017. There is no evidence of pleural effusion or pneumothorax. The heart is normal in size; the mediastinal contour is within normal limits. No acute osseous abnormalities are seen. IMPRESSION: Vascular congestion noted; mild left basilar atelectasis or scarring noted, stable from 2017. Electronically Signed   By: Garald Balding M.D.   On: 11/16/2017 21:33   @IMAGES @  Assessment: 1. Anemia - as mentioned previously, this is  likely multifactorial. I do not believe the patient has recent or currently hemodynamically significant gastrointestinal bleeding. It is possible there is bleeding from vascular ectasias but it is very slowly bleeding if at all.  Endoscopic hemostasis currently not indicated for these lesions. Renal insufficiency is another plausible etiology of anemia in this case. Adequate workup endoscopically has been performed in the past and there does not appear to be a reason to do yet another GI evaluation in the absence of overt hemorrhage of the GI tract.   2. Heme positive stool. This is of unknown signficance as patient has vascular malformations of the colon and internal hemorrrhoids, both potential sources.  3. MGUS - per oncology  Recommendations:  1. I agree with GI propylaxis. 2. I do NOT advise endoluminal evaluation at this time unless overt bleeding ensues.  3. Proceed with surgery as dictated medically. 4. Call back if I can help.   Thank you for the consult. Please call with questions or  concerns.  Olean Ree, "Lanny Hurst MD [facility] [place], [place] [address] [phone]  11/17/2017 1:27 PM

## 2017-11-17 NOTE — Consult Note (Signed)
[employer] CONSULT NOTE  Patient Care Team: [name], MD as PCP - General (Internal Medicine)  CHIEF COMPLAINTS/PURPOSE OF CONSULTATION:  Worsening anemia  HISTORY OF PRESENTING ILLNESS:  [name] 67 y.o.  male history of chronic kidney disease/iron deficiency; cirrhosis; history of MGUS recent worsening anemia; recurrent basaloid cancer of the perineum is currently admitted to hospital for chest pain/noted to have hemoglobin of 7.  Patient is currently awaiting surgery with Dr. Bary Castilla for his perineal basaloid cancer-plan tomorrow.  Patient admits to intermittent black loose stools [patient is on Pepto-Bismol p.o. Iron].  Denies any blood in stools.  Patient had 1 unit of blood transfusion yesterday.  He feels improved.  No nausea no vomiting.  He is awaiting one more transfusion today.  ROS: A complete 10 point review of system is done which is negative except mentioned above in history of present illness  MEDICAL HISTORY:  Past Medical History:  Diagnosis Date  . Anemia   . Cancer ([place]) 11/02/2014   PERIANAL MASS, RIGHT; INCISIONAL BIOPSY: INVASIVE CARCINOMA WITH BASALOID FEATURES  . CHF (congestive heart failure) ([place])   . Chronic kidney disease   . COPD (chronic obstructive pulmonary disease) ([place])    NO INHALERS  . Diabetes mellitus    NO MEDS  . GERD (gastroesophageal reflux disease)   . Hepatic cirrhosis ([place])   . Hypertension   . Leg swelling   . Loose, teeth    PATIENT STATES "HAS 6 TEETH, SOME ARE LOOSE"  . Neuromuscular disorder ([place])    RIGHT HAND AND FINGERS NUMB  . Shortness of breath dyspnea    WITH EXERTION   . 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 APPENDECTOMY    . COLONOSCOPY WITH PROPOFOL N/A 11/19/2014   Procedure: COLONOSCOPY WITH PROPOFOL;  Surgeon: Lucilla Lame, MD;  Location: [facility];  Service: Endoscopy;  Laterality: N/A;   DIABETIC-ORAL MEDS  . COLONOSCOPY WITH PROPOFOL N/A 10/04/2016   Procedure: COLONOSCOPY WITH PROPOFOL;  Surgeon: Jonathon Bellows, MD;  Location: [facility] ENDOSCOPY;  Service: Endoscopy;  Laterality: N/A;  . ESOPHAGOGASTRODUODENOSCOPY (EGD) WITH PROPOFOL N/A 10/04/2016   Procedure: ESOPHAGOGASTRODUODENOSCOPY (EGD) WITH PROPOFOL;  Surgeon: Jonathon Bellows, MD;  Location: [facility] ENDOSCOPY;  Service: Endoscopy;  Laterality: N/A;  . GIVENS CAPSULE STUDY N/A 11/13/2016   Procedure: GIVENS CAPSULE STUDY;  Surgeon: Jonathon Bellows, MD;  Location: [facility] ENDOSCOPY;  Service: Gastroenterology;  Laterality: N/A;  . INGUINAL LYMPH NODE BIOPSY Right 01/26/2016   Procedure: INGUINAL LYMPH NODE BIOPSY;  Surgeon: Clayburn Pert, MD;  Location: [facility] ORS;  Service: General;  Laterality: Right;  . RECTAL BIOPSY N/A 11/02/2014   Procedure: BIOPSY RECTAL;  Surgeon: Marlyce Huge, MD;  Location: [facility] ORS;  Service: General;  Laterality: N/A;  . RECTAL EXAM UNDER ANESTHESIA N/A 11/02/2014   Procedure: RECTAL EXAM UNDER ANESTHESIA;  Surgeon: Marlyce Huge, MD;  Location: [facility] ORS;  Service: General;  Laterality: N/A;  . TONSILLECTOMY      SOCIAL HISTORY: Social History   Socioeconomic History  . Marital status: Divorced    Spouse name: Not on file  . Number of children: Not on file  . Years of education: Not on file  . Highest education level: Not on file  Occupational History  . Not on file  Social Needs  . Financial resource strain: Somewhat hard  . Food insecurity:    Worry: Patient refused  Inability: Patient refused  . Transportation needs:    Medical: Patient refused    Non-medical: Patient refused  Tobacco Use  . Smoking status: Current Every Day Smoker    Packs/day: 0.50    Years: 20.00    Pack years: 10.00    Types: Cigarettes  . Smokeless tobacco: Never Used  . Tobacco comment: 1 PACK EVERY 2 DAYS  Substance and Sexual Activity  . Alcohol use: Not Currently  . Drug use: Yes    Types:  Marijuana    Comment: Last Use 2016 per patient-+ UDS FOR COCAINE IN 2014  . Sexual activity: Never  Lifestyle  . Physical activity:    Days per week: 0 days    Minutes per session: 0 min  . Stress: To some extent  Relationships  . Social connections:    Talks on phone: More than three times a week    Gets together: More than three times a week    Attends religious service: More than 4 times per year    Active member of club or organization: Yes    Attends meetings of clubs or organizations: More than 4 times per year    Relationship status: Divorced  . Intimate partner violence:    Fear of current or ex partner: Patient refused    Emotionally abused: Patient refused    Physically abused: Patient refused    Forced sexual activity: Patient refused  Other Topics Concern  . Not on file  Social History Narrative  . Not on file    FAMILY HISTORY: Family History  Problem Relation Age of Onset  . Asthma Mother   . Colon cancer Neg Hx     ALLERGIES:  has No Known Allergies.  MEDICATIONS:  Current Facility-Administered Medications  Medication Dose Route Frequency Provider Last Rate Last Dose  . acetaminophen (TYLENOL) tablet 650 mg  650 mg Oral Q6H PRN [name], MD       Or  . acetaminophen (TYLENOL) suppository 650 mg  650 mg Rectal Q6H PRN [name], MD      . amLODipine (NORVASC) tablet 10 mg  10 mg Oral Edwinna Areola, MD   10 mg at 11/17/17 0848  . atorvastatin (LIPITOR) tablet 40 mg  40 mg Oral Edwinna Areola, MD   40 mg at 11/17/17 0847  . chlorthalidone (HYGROTON) tablet 25 mg  25 mg Oral Edwinna Areola, MD   25 mg at 11/17/17 0849  . docusate sodium (COLACE) capsule 100 mg  100 mg Oral BID [name], MD   100 mg at 11/17/17 0848  . ferrous sulfate tablet 325 mg  325 mg Oral Daily [name], MD   325 mg at 11/17/17 0849  . hydrALAZINE (APRESOLINE) tablet 25 mg  25 mg Oral Q8H Demetrios Loll, MD      .  labetalol (NORMODYNE,TRANDATE) injection 10 mg  10 mg Intravenous Q2H PRN Demetrios Loll, MD      . ondansetron [facility]) tablet 4 mg  4 mg Oral Q6H PRN [name], MD       Or  . ondansetron [facility]) injection 4 mg  4 mg Intravenous Q6H PRN [name], MD      . pantoprazole (PROTONIX) EC tablet 40 mg  40 mg Oral BID AC Demetrios Loll, MD      . polyethylene glycol (MIRALAX / GLYCOLAX) packet 17 g  17 g Oral Daily PRN [name], MD      .  sodium chloride flush (NS) 0.9 % injection 3 mL  3 mL Intravenous Q12H Demetrios Loll, MD   3 mL at 11/17/17 0954  . sodium chloride flush (NS) 0.9 % injection 3 mL  3 mL Intravenous PRN Demetrios Loll, MD      . vitamin B-12 (CYANOCOBALAMIN) tablet 1,000 mcg  1,000 mcg Oral Daily [name], MD   1,000 mcg at 11/17/17 0848  . [START ON 11/21/2017] Vitamin D (Ergocalciferol) (DRISDOL) capsule 50,000 Units  50,000 Units Oral Q7 days [name], MD       Facility-Administered Medications Ordered in Other Encounters  Medication Dose Route Frequency Provider Last Rate Last Dose  . Darbepoetin Alfa (ARANESP) injection 100 mcg  100 mcg Subcutaneous Q30 days Sindy Guadeloupe, MD   100 mcg at 07/25/17 0924  . Darbepoetin Alfa (ARANESP) injection 100 mcg  100 mcg Subcutaneous Q30 days Sindy Guadeloupe, MD   100 mcg at 08/22/17 1015  . epoetin alfa (EPOGEN,PROCRIT) injection 40,000 Units  40,000 Units Subcutaneous Weekly Sindy Guadeloupe, MD   40,000 Units at 05/23/17 1145      .  PHYSICAL EXAMINATION:  Vitals:   11/17/17 0929 11/17/17 1125  BP: (!) 167/94 (!) 163/84  Pulse: 75 77  Resp: 20 20  Temp: 98.9 F (37.2 C) 98.3 F (36.8 C)  SpO2: 98% 100%   Filed Weights   11/17/17 0319  Weight: 185 lb 13.6 oz (84.3 kg)    GENERAL: Well-nourished well-developed; Alert, no distress and comfortable.   Alone. EYES: Positive for pallor no icterus. OROPHARYNX: no thrush or ulceration. NECK: supple, no masses felt LYMPH:  no palpable  lymphadenopathy in the cervical, axillary or inguinal regions LUNGS: decreased breath sounds to auscultation at bases and  No wheeze or crackles HEART/CVS: regular rate & rhythm and no murmurs; No lower extremity edema ABDOMEN: abdomen soft, non-tender and normal bowel sounds Musculoskeletal:no cyanosis of digits and no clubbing  PSYCH: alert & oriented x 3 with fluent speech NEURO: no focal motor/sensory deficits SKIN: Multiple foot ulcers noted. LABORATORY DATA:  I have reviewed the data as listed Lab Results  Component Value Date   WBC 8.6 11/16/2017   HGB 7.0 (L) 11/16/2017   HCT 21.1 (L) 11/16/2017   MCV 88.4 11/16/2017   PLT 240 11/16/2017   Recent Labs    06/27/17 1338 09/19/17 0929 11/11/17 0839 11/16/17 2108  NA 131* 133* 137 136  K 4.9 4.6 4.5 4.1  CL 105 106 109 109  CO2 20* 22 22 21*  GLUCOSE 203* 164* 144* 159*  BUN 35* 44* 34* 28*  CREATININE 1.88* 2.71* 2.00* 2.04*  CALCIUM 8.1* 8.2* 8.0* 8.4*  GFRNONAA 36* 23* 33* 33*  GFRAA 42* 27* 39* 38*  PROT 7.1 7.4 7.0  --   ALBUMIN 2.6* 2.8* 2.8*  --   AST 44* 42* 33  --   ALT 30 31 21   --   ALKPHOS 75 79 68  --   BILITOT 0.5 0.4 0.7  --     RADIOGRAPHIC STUDIES: I have personally reviewed the radiological images as listed and agreed with the findings in the report. Dg Chest 2 View  Result Date: 11/16/2017 CLINICAL DATA:  Acute onset of intermittent generalized chest pain. EXAM: CHEST - 2 VIEW COMPARISON:  Chest radiograph performed 01/23/2016 FINDINGS: The lungs are well-aerated. Vascular congestion is noted. Mild left basilar atelectasis or scarring is noted, stable from 2017. There is no evidence of pleural effusion or pneumothorax.  The heart is normal in size; the mediastinal contour is within normal limits. No acute osseous abnormalities are seen. IMPRESSION: Vascular congestion noted; mild left basilar atelectasis or scarring noted, stable from 2017. Electronically Signed   By: Garald Balding M.D.   On:  11/16/2017 21:33    ASSESSMENT & PLAN:   #67 year old male patient with multiple medical problems is currently admitted the hospital for worsening anemia  # Anemia-iron deficiency CKD stage III-at admission hemoglobin 7; status post 1 unit of transfusion.  Awaiting one more today.  Iron studies are difficult to interpret given his chronic kidney disease/recent iron infusion.  Clinically I do not suspect worsening myeloma is because of his worsening anemia.  Awaiting GI evaluation.  # Recurrent Basloid  Ca of perinal region vs Anal canal- status post radiation-currently with recurrence of awaiting surgery tomorrow.  Will inform Dr. Bary Castilla.   #MGUS-IgG lambda July 2019 M protein 1.3 with kappa lambda light chain slightly abnormal.  Overall fairly stable; clinically less likely to cause worsening anemia at this time.  However if no other cause found then  #Cirrhosis with portal hypertension/chronic kidney disease stage III/CHF/poorly controlled diabetes/foot ulcers-overall poor prognosis.  Thank you Dr.Chen for allowing me to participate in the care of your pleasant patient. Please do not hesitate to contact me with questions or concerns in the interim.  All questions were answered. The patient knows to call the clinic with any problems, questions or concerns.    [name], MD 11/17/2017 11:27 AM

## 2017-11-17 NOTE — H&P (Signed)
[name] is an 67 y.o. male.   Chief Complaint: Chest pain HPI: The patient with past medical history of alcoholic cirrhosis, hypertension, CHF, chronic kidney disease, anemia, anal cancer and MGUS presents to the emergency department with vague left side chest, rib, flank and back pain.  He was found to have elevated blood pressure but no indication of myocardial ischemia.  Laboratory evaluation was significant for anemia.  This is a chronic problem for the patient but his blood count has been trending downward over the last 3 months.  The patient has been receiving blood transfusions as an outpatient.  He also receives chemotherapy for basal cell carcinoma of a perineal lesion without lymphatic spread.  In the emergency department the patient was also found to be Hemoccult positive which prompted the treatment team to call the hospitalist service for admission.   Past Medical History:  Diagnosis Date  . Anemia   . Cancer ([place]) 11/02/2014   PERIANAL MASS, RIGHT; INCISIONAL BIOPSY: INVASIVE CARCINOMA WITH BASALOID FEATURES  . CHF (congestive heart failure) ([place])   . Chronic kidney disease   . COPD (chronic obstructive pulmonary disease) ([place])    NO INHALERS  . Diabetes mellitus    NO MEDS  . GERD (gastroesophageal reflux disease)   . Hepatic cirrhosis ([place])   . Hypertension   . Leg swelling   . Loose, teeth    PATIENT STATES "HAS 6 TEETH, SOME ARE LOOSE"  . Neuromuscular disorder ([place])    RIGHT HAND AND FINGERS NUMB  . Shortness of breath dyspnea    WITH EXERTION   . 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 APPENDECTOMY    . COLONOSCOPY WITH PROPOFOL N/A 11/19/2014   Procedure: COLONOSCOPY WITH PROPOFOL;  Surgeon: Lucilla Lame, MD;  Location: [facility];  Service: Endoscopy;  Laterality: N/A;  DIABETIC-ORAL MEDS  . COLONOSCOPY WITH PROPOFOL N/A 10/04/2016   Procedure: COLONOSCOPY WITH PROPOFOL;  Surgeon:  Jonathon Bellows, MD;  Location: [facility] ENDOSCOPY;  Service: Endoscopy;  Laterality: N/A;  . ESOPHAGOGASTRODUODENOSCOPY (EGD) WITH PROPOFOL N/A 10/04/2016   Procedure: ESOPHAGOGASTRODUODENOSCOPY (EGD) WITH PROPOFOL;  Surgeon: Jonathon Bellows, MD;  Location: [facility] ENDOSCOPY;  Service: Endoscopy;  Laterality: N/A;  . GIVENS CAPSULE STUDY N/A 11/13/2016   Procedure: GIVENS CAPSULE STUDY;  Surgeon: Jonathon Bellows, MD;  Location: [facility] ENDOSCOPY;  Service: Gastroenterology;  Laterality: N/A;  . INGUINAL LYMPH NODE BIOPSY Right 01/26/2016   Procedure: INGUINAL LYMPH NODE BIOPSY;  Surgeon: Clayburn Pert, MD;  Location: [facility] ORS;  Service: General;  Laterality: Right;  . RECTAL BIOPSY N/A 11/02/2014   Procedure: BIOPSY RECTAL;  Surgeon: Marlyce Huge, MD;  Location: [facility] ORS;  Service: General;  Laterality: N/A;  . RECTAL EXAM UNDER ANESTHESIA N/A 11/02/2014   Procedure: RECTAL EXAM UNDER ANESTHESIA;  Surgeon: Marlyce Huge, MD;  Location: [facility] ORS;  Service: General;  Laterality: N/A;  . TONSILLECTOMY      Family History  Problem Relation Age of Onset  . Asthma Mother   . Colon cancer Neg Hx    Social History:  reports that he has been smoking cigarettes. He has a 10.00 pack-year smoking history. He has never used smokeless tobacco. He reports that he drank alcohol. He reports that he has current or past drug history. Drug: Marijuana.  Allergies: No Known Allergies  Medications Prior to Admission  Medication Sig Dispense Refill  . amLODipine (NORVASC) 10 MG tablet Take 10 mg by  mouth every morning.     Marland Kitchen aspirin EC 81 MG tablet Take 81 mg by mouth daily.    Marland Kitchen atorvastatin (LIPITOR) 40 MG tablet Take 40 mg by mouth every morning.     . bismuth subsalicylate (PEPTO BISMOL) 262 MG/15ML suspension Take 30 mLs by mouth every 6 (six) hours as needed for indigestion or diarrhea or loose stools.     . chlorthalidone (HYGROTON) 25 MG tablet Take 25 mg by mouth every morning.     . ferrous sulfate 325 (65  FE) MG tablet Take 325 mg by mouth daily.     . hydrALAZINE (APRESOLINE) 25 MG tablet Take 25 mg 2 (two) times daily by mouth.    Marland Kitchen omeprazole (PRILOSEC) 40 MG capsule Take 40 mg by mouth every morning.     . polyethylene glycol (MIRALAX / GLYCOLAX) packet Take 17 g by mouth daily as needed for mild constipation.     . vitamin B-12 (CYANOCOBALAMIN) 1000 MCG tablet Take 1 tablet (1,000 mcg total) by mouth daily. 30 tablet 3  . Vitamin D, Ergocalciferol, (DRISDOL) 50000 units CAPS capsule Take 50,000 Units by mouth every 7 (seven) days. On Thursdays      Results for orders placed or performed during the hospital encounter of 11/17/17 (from the past 48 hour(s))  Glucose, capillary     Status: Abnormal   Collection Time: 11/16/17  9:07 PM  Result Value Ref Range   Glucose-Capillary 152 (H) 70 - 99 mg/dL  Basic metabolic panel     Status: Abnormal   Collection Time: 11/16/17  9:08 PM  Result Value Ref Range   Sodium 136 135 - 145 mmol/L   Potassium 4.1 3.5 - 5.1 mmol/L   Chloride 109 98 - 111 mmol/L   CO2 21 (L) 22 - 32 mmol/L   Glucose, Bld 159 (H) 70 - 99 mg/dL   BUN 28 (H) 8 - 23 mg/dL   Creatinine, Ser 2.04 (H) 0.61 - 1.24 mg/dL   Calcium 8.4 (L) 8.9 - 10.3 mg/dL   GFR calc non Af Amer 33 (L) >60 mL/min   GFR calc Af Amer 38 (L) >60 mL/min    Comment: (NOTE) The eGFR has been calculated using the CKD EPI equation. This calculation has not been validated in all clinical situations. eGFR's persistently <60 mL/min signify possible Chronic Kidney Disease.    Anion gap 6 5 - 15    Comment: Performed at [facility], [address] [place], [place] [address]  CBC     Status: Abnormal   Collection Time: 11/16/17  9:08 PM  Result Value Ref Range   WBC 8.6 3.8 - 10.6 K/uL   RBC 2.39 (L) 4.40 - 5.90 MIL/uL   Hemoglobin 7.0 (L) 13.0 - 18.0 g/dL   HCT 21.1 (L) 40.0 - 52.0 %   MCV 88.4 80.0 - 100.0 fL   MCH 29.3 26.0 - 34.0 pg   MCHC 33.2 32.0 - 36.0 g/dL   RDW 15.8 (H) 11.5  - 14.5 %   Platelets 240 150 - 440 K/uL    Comment: Performed at [facility], [address] [place], [place] [address]  Troponin I     Status: None   Collection Time: 11/16/17  9:08 PM  Result Value Ref Range   Troponin I <0.03 <0.03 ng/mL    Comment: Performed at [facility], [address], [place], [place] [address]  Type and screen [place]  Status: None (Preliminary result)   Collection Time: 11/17/17  4:17 AM  Result Value Ref Range   ABO/RH(D) A POS    Antibody Screen NEG    Sample Expiration 11/20/2017    Unit Number [id number]    Blood Component Type RED CELLS,LR    Unit division 00    Status of Unit ALLOCATED    Transfusion Status OK TO TRANSFUSE    Crossmatch Result Compatible    Unit Number [id number]    Blood Component Type RED CELLS,LR    Unit division 00    Status of Unit ISSUED    Transfusion Status OK TO TRANSFUSE    Crossmatch Result      Compatible Performed at [facility], [address] [place], [place] [address]   Prepare RBC     Status: None   Collection Time: 11/17/17  4:17 AM  Result Value Ref Range   Order Confirmation      ORDER PROCESSED BY BLOOD BANK Performed at [facility], [address] [place], [place] [address]   TSH     Status: None   Collection Time: 11/17/17  4:17 AM  Result Value Ref Range   TSH 4.031 0.350 - 4.500 uIU/mL    Comment: Performed by a 3rd Generation assay with a functional sensitivity of <=0.01 uIU/mL. Performed at [facility], [address] [place],  [address]    Dg Chest 2 View  Result Date: 11/16/2017 CLINICAL DATA:  Acute onset of intermittent generalized chest pain. EXAM: CHEST - 2 VIEW COMPARISON:  Chest radiograph performed 01/23/2016 FINDINGS: The lungs are well-aerated. Vascular congestion is noted. Mild left basilar atelectasis or scarring is noted, stable from 2017. There is no evidence of pleural effusion or  pneumothorax. The heart is normal in size; the mediastinal contour is within normal limits. No acute osseous abnormalities are seen. IMPRESSION: Vascular congestion noted; mild left basilar atelectasis or scarring noted, stable from 2017. Electronically Signed   By: Garald Balding M.D.   On: 11/16/2017 21:33    Review of Systems  Constitutional: Negative for chills and fever.  HENT: Negative for sore throat and tinnitus.   Eyes: Negative for blurred vision and redness.  Respiratory: Negative for cough and shortness of breath.   Cardiovascular: Positive for chest pain (Resolved). Negative for palpitations, orthopnea and PND.  Gastrointestinal: Negative for abdominal pain, diarrhea, nausea and vomiting.  Genitourinary: Negative for dysuria, frequency and urgency.  Musculoskeletal: Negative for joint pain and myalgias.  Skin: Negative for rash.       No lesions  Neurological: Negative for speech change, focal weakness and weakness.  Endo/Heme/Allergies: Does not bruise/bleed easily.       No temperature intolerance  Psychiatric/Behavioral: Negative for depression and suicidal ideas.    Blood pressure (!) 176/85, pulse 77, temperature 98.1 F (36.7 C), temperature source Oral, resp. rate 18, height 6' (1.829 m), weight 84.3 kg, SpO2 100 %. Physical Exam  Vitals reviewed. Constitutional: He is oriented to person, place, and time. He appears well-developed and well-nourished. No distress.  HENT:  Head: Normocephalic and atraumatic.  Mouth/Throat: Oropharynx is clear and moist.  Eyes: Pupils are equal, round, and reactive to light. Conjunctivae and EOM are normal. No scleral icterus.  Neck: Normal range of motion. Neck supple. No JVD present. No tracheal deviation present. No thyromegaly present.  Cardiovascular: Normal rate, regular rhythm and normal heart sounds. Exam reveals no gallop and no friction rub.  No murmur heard. Respiratory: Effort  normal and breath sounds normal. No  respiratory distress.  GI: Soft. Bowel sounds are normal. He exhibits distension. He exhibits no mass. There is no tenderness. There is no rebound and no guarding.  Genitourinary:  Genitourinary Comments: Deferred  Musculoskeletal: Normal range of motion. He exhibits no edema.  Lymphadenopathy:    He has no cervical adenopathy.  Neurological: He is alert and oriented to person, place, and time. No cranial nerve deficit.  Skin: Skin is warm and dry. No rash noted. No erythema.  Psychiatric: He has a normal mood and affect. His behavior is normal. Judgment and thought content normal.     Assessment/Plan Is a 67 year old male admitted for anemia. 1.  Anemia: Multifactorial including MGUS, chronic kidney disease, chronic disease and possibly GI losses.  The latter have been explored extensively in the past.  Notably the patient has recurrent gastritis regularly takes Pepto-Bismol and iron which can cause positive Hemoccult stools.  He has had EGD and colonoscopy in the past which have not shown polyps, lesions or bleeding vessels.  Blood transfusion ordered for the emergency department.  Consult oncology for further guidance. 2.  Melena: No gross blood reported however the patient is Hemoccult positive as discussed above.  Consult gastroenterology. 3.  Hypertension: Uncontrolled; continue amlodipine, hydralazine and chlorthalidone.  Labetalol as needed. 4.  CKD: Stage III; hydrate with intravenous fluid.  Avoid nephrotoxic agents. 5.  DVT prophylaxis: SCDs 6.  GI prophylaxis: Pantoprazole IV The patient is a full code.  Time spent on admission orders and patient care proximally 45 minutes  [name], MD 11/17/2017, 6:57 AM

## 2017-11-17 NOTE — ED Provider Notes (Signed)
[facility] Emergency Department Provider Note   ____________________________________________   First MD Initiated Contact with Patient 11/17/17 0100     (approximate)  I have reviewed the triage vital signs and the nursing notes.   HISTORY  Chief Complaint Chest Pain    HPI [name] is a 67 y.o. male who presents to the ED from home with a chief complaint of chest pain.  Patient reports central chest tightness worsened with exertion for the past 2 days.  History of iron deficiency anemia who receives iron infusions at the cancer center.  Last infusion was on 11/14/2017.  Denies recent fever, chills, abdominal pain, nausea, vomiting, diaphoresis.  Patient is scheduled for basal cell removal from his buttock tomorrow on 8/12.  Denies recent travel or trauma.  Denies NSAIDs, hormone use.   Past Medical History:  Diagnosis Date  . Anemia   . 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 CHF (congestive heart failure) ([place])   . Chronic kidney disease   . COPD (chronic obstructive pulmonary disease) ([place])    NO INHALERS  . Diabetes mellitus    NO MEDS  . GERD (gastroesophageal reflux disease)   . Hepatic cirrhosis ([place])   . Hypertension   . Leg swelling   . Loose, teeth    PATIENT STATES "HAS 6 TEETH, SOME ARE LOOSE"  . Neuromuscular disorder ([place])    RIGHT HAND AND FINGERS NUMB  . Shortness of breath dyspnea    WITH EXERTION   . 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 Mass of perianal area 10/16/2017  . Mass of pharynx   . Polyp of cecum   . Iron deficiency anemia 09/19/2016  . Chest pain with high risk for cardiac etiology 08/13/2016  . SOB (shortness of breath) on exertion 08/13/2016  . Varicose veins of both lower extremities with inflammation 06/11/2016  . Essential hypertension 06/11/2016  . Chronic  venous insufficiency 05/08/2016  . Type 2 diabetes mellitus with complication, without long-term current use of insulin ([place]) 05/08/2016  . Lymphedema 04/12/2016  . Ulcer of great toe ([place]) 04/12/2016  . 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 Anemia 01/26/2016  . Skin mass   . Lymphadenopathy, inguinal   . Symptomatic anemia 01/24/2016  . Hypoglycemia 09/13/2015  . Internal hemorrhoids 12/30/2014  . Mass of anus 11/01/2014    Past Surgical History:  Procedure Laterality Date  . APPENDECTOMY    . COLONOSCOPY WITH PROPOFOL N/A 11/19/2014   Procedure: COLONOSCOPY WITH PROPOFOL;  Surgeon: Lucilla Lame, MD;  Location: [facility];  Service: Endoscopy;  Laterality: N/A;  DIABETIC-ORAL MEDS  . COLONOSCOPY WITH PROPOFOL N/A 10/04/2016   Procedure: COLONOSCOPY WITH PROPOFOL;  Surgeon: Jonathon Bellows, MD;  Location: [facility] ENDOSCOPY;  Service: Endoscopy;  Laterality: N/A;  . ESOPHAGOGASTRODUODENOSCOPY (EGD) WITH PROPOFOL N/A 10/04/2016   Procedure: ESOPHAGOGASTRODUODENOSCOPY (EGD) WITH PROPOFOL;  Surgeon: Jonathon Bellows, MD;  Location: [facility] ENDOSCOPY;  Service: Endoscopy;  Laterality: N/A;  . GIVENS CAPSULE STUDY N/A 11/13/2016   Procedure: GIVENS CAPSULE STUDY;  Surgeon: Jonathon Bellows, MD;  Location: [facility] ENDOSCOPY;  Service: Gastroenterology;  Laterality: N/A;  . INGUINAL LYMPH NODE BIOPSY Right 01/26/2016   Procedure: INGUINAL LYMPH NODE BIOPSY;  Surgeon: Clayburn Pert, MD;  Location: [facility] ORS;  Service: General;  Laterality: Right;  . RECTAL BIOPSY N/A 11/02/2014  Procedure: BIOPSY RECTAL;  Surgeon: Marlyce Huge, MD;  Location: [facility] ORS;  Service: General;  Laterality: N/A;  . RECTAL EXAM UNDER ANESTHESIA N/A 11/02/2014   Procedure: RECTAL EXAM UNDER ANESTHESIA;  Surgeon: Marlyce Huge, MD;  Location: [facility] ORS;  Service: General;  Laterality: N/A;  . TONSILLECTOMY      Prior to Admission medications   Medication Sig Start Date End Date Taking? Authorizing  Provider  amLODipine (NORVASC) 10 MG tablet Take 10 mg by mouth every morning.     [provider]  aspirin EC 81 MG tablet Take 81 mg by mouth daily.    [provider]  atorvastatin (LIPITOR) 40 MG tablet Take 40 mg by mouth every morning.     [provider]  bismuth subsalicylate (PEPTO BISMOL) 262 MG/15ML suspension Take 30 mLs by mouth every 6 (six) hours as needed for indigestion or diarrhea or loose stools.     [provider]  chlorthalidone (HYGROTON) 25 MG tablet Take 25 mg by mouth every morning.     [provider]  ferrous sulfate 325 (65 FE) MG tablet Take 325 mg by mouth daily.     [provider]  hydrALAZINE (APRESOLINE) 25 MG tablet Take 25 mg 2 (two) times daily by mouth.    [provider]  omeprazole (PRILOSEC) 40 MG capsule Take 40 mg by mouth every morning.     [provider]  polyethylene glycol (MIRALAX / GLYCOLAX) packet Take 17 g by mouth daily as needed for mild constipation.     [provider]  vitamin B-12 (CYANOCOBALAMIN) 1000 MCG tablet Take 1 tablet (1,000 mcg total) by mouth daily. 06/03/17   Sindy Guadeloupe, MD  Vitamin D, Ergocalciferol, (DRISDOL) 50000 units CAPS capsule Take 50,000 Units by mouth every 7 (seven) days. On Thursdays    [provider]    Allergies Patient has no known allergies.  Family History  Problem Relation Age of Onset  . Asthma Mother   . Colon cancer Neg Hx     Social History Social History   Tobacco Use  . Smoking status: Current Some Day Smoker    Packs/day: 0.25    Years: 20.00    Pack years: 5.00    Types: Cigarettes  . Smokeless tobacco: Never Used  . Tobacco comment: 1 PACK EVERY 2 DAYS  Substance Use Topics  . Alcohol use: Not Currently  . Drug use: Yes    Types: Marijuana    Comment: Last Use 2016 per patient-+ UDS FOR COCAINE IN 2014    Review of Systems  Constitutional: No fever/chills Eyes: No visual  changes. ENT: No sore throat. Cardiovascular: Positive for chest pain. Respiratory: Positive for shortness of breath. Gastrointestinal: No abdominal pain.  No nausea, no vomiting.  No diarrhea.  No constipation. Genitourinary: Negative for dysuria. Musculoskeletal: Negative for back pain. Skin: Negative for rash. Neurological: Negative for headaches, focal weakness or numbness.   ____________________________________________   PHYSICAL EXAM:  VITAL SIGNS: ED Triage Vitals [11/16/17 2104]  Enc Vitals Group     BP (!) 173/73     Pulse Rate 71     Resp 16     Temp 98.7 F (37.1 C)     Temp Source Oral     SpO2 96 %     Weight      Height      Head Circumference      Peak Flow      Pain Score 6  Pain Loc      Pain Edu?      Excl. in [place]?     Constitutional: Alert and oriented.  Weak appearing and in mild acute distress. Eyes: Conjunctivae are normal. PERRL. EOMI. Head: Atraumatic. Nose: No congestion/rhinnorhea. Mouth/Throat: Mucous membranes are moist.  Oropharynx non-erythematous. Neck: No stridor.   Cardiovascular: Normal rate, regular rhythm. Grossly normal heart sounds.  Good peripheral circulation. Respiratory: Normal respiratory effort.  No retractions. Lungs CTAB. Gastrointestinal: Soft and nontender. No distention. No abdominal bruits. No CVA tenderness. Musculoskeletal: No lower extremity tenderness nor edema.  No joint effusions. Neurologic:  Normal speech and language. No gross focal neurologic deficits are appreciated.  Skin:  Skin is warm, dry and intact. No rash noted. Psychiatric: Mood and affect are normal. Speech and behavior are normal.  ____________________________________________   LABS (all labs ordered are listed, but only abnormal results are displayed)  Labs Reviewed  GLUCOSE, CAPILLARY - Abnormal; Notable for the following components:      Result Value   Glucose-Capillary 152 (*)    All other components within normal limits  BASIC  METABOLIC PANEL - Abnormal; Notable for the following components:   CO2 21 (*)    Glucose, Bld 159 (*)    BUN 28 (*)    Creatinine, Ser 2.04 (*)    Calcium 8.4 (*)    GFR calc non Af Amer 33 (*)    GFR calc Af Amer 38 (*)    All other components within normal limits  CBC - Abnormal; Notable for the following components:   RBC 2.39 (*)    Hemoglobin 7.0 (*)    HCT 21.1 (*)    RDW 15.8 (*)    All other components within normal limits  TROPONIN I  CBG MONITORING, ED  TYPE AND SCREEN  PREPARE RBC (CROSSMATCH)   ____________________________________________  EKG  ED ECG REPORT I, Kea Callan J, the attending physician, personally viewed and interpreted this ECG.   Date: 11/17/2017  EKG Time: 2103  Rate: 83  Rhythm: normal EKG, normal sinus rhythm  Axis: Normal  Intervals:none  ST&T Change: Hyperacute T waves No significant change from EKG dated 11/11/2017 ____________________________________________  RADIOLOGY  ED MD interpretation: Mild vascular congestion  Official radiology report(s): Dg Chest 2 View  Result Date: 11/16/2017 CLINICAL DATA:  Acute onset of intermittent generalized chest pain. EXAM: CHEST - 2 VIEW COMPARISON:  Chest radiograph performed 01/23/2016 FINDINGS: The lungs are well-aerated. Vascular congestion is noted. Mild left basilar atelectasis or scarring is noted, stable from 2017. There is no evidence of pleural effusion or pneumothorax. The heart is normal in size; the mediastinal contour is within normal limits. No acute osseous abnormalities are seen. IMPRESSION: Vascular congestion noted; mild left basilar atelectasis or scarring noted, stable from 2017. Electronically Signed   By: Garald Balding M.D.   On: 11/16/2017 21:33    ____________________________________________   PROCEDURES  Procedure(s) performed:   Rectal exam: External exam reveals basal cell on left buttock.  No external hemorrhoids.  [place] stool on gloved finger which is immediately  heme positive.  Procedures  Critical Care performed: Yes, see critical care note(s)   CRITICAL CARE Performed by: Paulette Blanch   Total critical care time: 30 minutes  Critical care time was exclusive of separately billable procedures and treating other patients.  Critical care was necessary to treat or prevent imminent or life-threatening deterioration.  Critical care was time spent personally by me on the following activities: development of treatment plan  with patient and/or surrogate as well as nursing, discussions with consultants, evaluation of patient's response to treatment, examination of patient, obtaining history from patient or surrogate, ordering and performing treatments and interventions, ordering and review of laboratory studies, ordering and review of radiographic studies, pulse oximetry and re-evaluation of patient's condition.  ____________________________________________   INITIAL IMPRESSION / ASSESSMENT AND PLAN / ED COURSE  As part of my medical decision making, I reviewed the following data within the [place] History obtained from family, Nursing notes reviewed and incorporated, Labs reviewed, EKG interpreted, Old EKG reviewed, Old chart reviewed, Radiograph reviewed, Discussed with admitting physician and Notes from prior ED visits   67 year old male with iron deficiency anemia receiving iron infusions who presents with exertional chest pain. Differential diagnosis includes, but is not limited to, ACS, aortic dissection, pulmonary embolism, cardiac tamponade, pneumothorax, pneumonia, pericarditis, myocarditis, GI-related causes including esophagitis/gastritis, and musculoskeletal chest wall pain.    Laboratory, EKG and imaging results remarkable for H/H of 7 and 21.1.  This is lower than 3 and 6 days ago and status post iron transfusion.  Patient's symptoms are most likely secondary to symptomatic anemia.  Melanotic stool found on exam.  Will  initiate Protonix bolus and drip.  Will type and cross for PRBC transfusion.  Discussed with hospitalist who will evaluate patient in the emergency department for admission.      ____________________________________________   FINAL CLINICAL IMPRESSION(S) / ED DIAGNOSES  Final diagnoses:  Chest pain, unspecified type  Symptomatic anemia  UGIB (upper gastrointestinal bleed)     ED Discharge Orders    None       Note:  This document was prepared using Dragon voice recognition software and may include unintentional dictation errors.    Paulette Blanch, MD 11/17/17 [phone]

## 2017-11-17 NOTE — Plan of Care (Signed)

## 2017-11-17 NOTE — Progress Notes (Signed)
Pt completed 2nd unit of PRBC's and tolerated well. Tolerated clear liquid diet well. No sign/symptom active bleeding. States feeling much stronger. Slept at intervals. VSS. Denies co's/issues.

## 2017-11-17 NOTE — Consult Note (Signed)
See the dictated note-dr.B

## 2017-11-18 ENCOUNTER — Encounter
Admission: EM | Disposition: A | Discharge status: Home / Self Care | Payer: Self-pay | Source: Home / Self Care | Attending: Emergency Medicine

## 2017-11-18 ENCOUNTER — Observation Stay: Payer: Medicare HMO | Admitting: Anesthesiology

## 2017-11-18 ENCOUNTER — Ambulatory Visit: Admission: RE | Admit: 2017-11-18 | Payer: Medicare HMO | Source: Ambulatory Visit | Admitting: General Surgery

## 2017-11-18 ENCOUNTER — Encounter: Payer: Self-pay | Admitting: Certified Registered Nurse Anesthetist

## 2017-11-18 LAB — URINE DRUG SCREEN, QUALITATIVE (ARMC ONLY)
AMPHETAMINES, UR SCREEN: NOT DETECTED
Barbiturates, Ur Screen: NOT DETECTED
Cannabinoid 50 Ng, Ur ~~LOC~~: NOT DETECTED
Cocaine Metabolite,Ur ~~LOC~~: POSITIVE — AB
MDMA (Ecstasy)Ur Screen: NOT DETECTED
METHADONE SCREEN, URINE: NOT DETECTED
Opiate, Ur Screen: NOT DETECTED
PHENCYCLIDINE (PCP) UR S: NOT DETECTED
Tricyclic, Ur Screen: NOT DETECTED

## 2017-11-18 LAB — TYPE AND SCREEN
ABO/RH(D): A POS
Antibody Screen: NEGATIVE
Unit division: 0
Unit division: 0

## 2017-11-18 LAB — HEMOGLOBIN: HEMOGLOBIN: 9.4 g/dL — AB (ref 13.0–18.0)

## 2017-11-18 LAB — BPAM RBC
BLOOD PRODUCT EXPIRATION DATE: 201908222359
Blood Product Expiration Date: 201908222359
ISSUE DATE / TIME: 201908111120
ISSUE DATE / TIME: 201908110518
Unit Type and Rh: 600
Unit Type and Rh: 600

## 2017-11-18 LAB — MRSA PCR SCREENING: MRSA BY PCR: NEGATIVE

## 2017-11-18 LAB — C-REACTIVE PROTEIN

## 2017-11-18 SURGERY — EXCISION, PILONIDAL CYST, EXTENSIVE
Anesthesia: Choice | Laterality: Right

## 2017-11-18 MED ORDER — CEFAZOLIN SODIUM-DEXTROSE 2-4 GM/100ML-% IV SOLN
2.0000 g | INTRAVENOUS | Status: DC
  Filled 2017-11-18: qty 100

## 2017-11-18 MED ORDER — SODIUM CHLORIDE 0.9 % IV SOLN: INTRAVENOUS | Status: DC

## 2017-11-18 MED ORDER — GABAPENTIN 300 MG PO CAPS
300.0000 mg | ORAL_CAPSULE | ORAL | Status: AC
  Administered 2017-11-18: 300 mg via ORAL
  Filled 2017-11-18: qty 1

## 2017-11-18 SURGICAL SUPPLY — 33 items
BLADE SURG 11 STRL SS SAFETY (MISCELLANEOUS) ×3 IMPLANT
BLADE SURG 15 STRL SS SAFETY (BLADE) ×3 IMPLANT
CANISTER SUCT 1200ML W/VALVE (MISCELLANEOUS) ×3 IMPLANT
CLOSURE WOUND 1/2 X4 (GAUZE/BANDAGES/DRESSINGS) ×1
DRAPE LAPAROTOMY 100X77 ABD (DRAPES) ×3 IMPLANT
DRSG TEGADERM 4X4.75 (GAUZE/BANDAGES/DRESSINGS) ×3 IMPLANT
DRSG TELFA 3X8 NADH (GAUZE/BANDAGES/DRESSINGS) ×3 IMPLANT
ELECT CAUTERY BLADE TIP 2.5 (TIP) ×3
ELECT REM PT RETURN 9FT ADLT (ELECTROSURGICAL) ×3
ELECTRODE CAUTERY BLDE TIP 2.5 (TIP) ×1 IMPLANT
ELECTRODE REM PT RTRN 9FT ADLT (ELECTROSURGICAL) ×1 IMPLANT
GLOVE BIO SURGEON STRL SZ7.5 (GLOVE) ×3 IMPLANT
GLOVE INDICATOR 8.0 STRL GRN (GLOVE) ×3 IMPLANT
GOWN STRL REUS W/ TWL LRG LVL3 (GOWN DISPOSABLE) ×2 IMPLANT
GOWN STRL REUS W/TWL LRG LVL3 (GOWN DISPOSABLE) ×4
KIT TURNOVER KIT A (KITS) ×3 IMPLANT
LABEL OR SOLS (LABEL) ×3 IMPLANT
NEEDLE HYPO 22GX1.5 SAFETY (NEEDLE) ×3 IMPLANT
NEEDLE HYPO 25X1 1.5 SAFETY (NEEDLE) ×3 IMPLANT
NS IRRIG 500ML POUR BTL (IV SOLUTION) ×3 IMPLANT
PACK BASIN MINOR ARMC (MISCELLANEOUS) ×3 IMPLANT
SOL PREP PVP 2OZ (MISCELLANEOUS) ×3
SOLUTION PREP PVP 2OZ (MISCELLANEOUS) ×1 IMPLANT
STRIP CLOSURE SKIN 1/2X4 (GAUZE/BANDAGES/DRESSINGS) ×2 IMPLANT
SUT PROLENE 4-0 (SUTURE) ×2
SUT PROLENE 4-0 RB1 30XMFL BLU (SUTURE) ×1
SUT VIC AB 2-0 CT1 (SUTURE) ×3 IMPLANT
SUT VIC AB 2-0 CT2 27 (SUTURE) ×3 IMPLANT
SUT VICRYL 3-0 27IN (SUTURE) ×3 IMPLANT
SUT VICRYL+ 3-0 144IN (SUTURE) ×3 IMPLANT
SUTURE PROLEN 4-0 RB1 30XMFL (SUTURE) ×1 IMPLANT
SWABSTK COMLB BENZOIN TINCTURE (MISCELLANEOUS) ×3 IMPLANT
SYR 10ML LL (SYRINGE) ×3 IMPLANT

## 2017-11-18 NOTE — Progress Notes (Signed)
Surgery cancelled for today secondary to patient's drinking orange juice and lack of surgeon time later today. He will be contacted to reschedule excision of basal cell carcinoma of the gluteal area.

## 2017-11-18 NOTE — Discharge Summary (Addendum)
[place] [facility] [place] NAME: [name]    MR#:  [id number]  DATE OF BIRTH:  [birth year]  DATE OF ADMISSION:  11/17/2017   ADMITTING PHYSICIAN: [name], MD  DATE OF DISCHARGE: 11/18/2017 PRIMARY CARE PHYSICIAN: [name], MD   ADMISSION DIAGNOSIS:  UGIB (upper gastrointestinal bleed) [K92.2] Symptomatic anemia [D64.9] Chest pain, unspecified type [R07.9] DISCHARGE DIAGNOSIS:  Active Problems:   Anemia  SECONDARY DIAGNOSIS:   Past Medical History:  Diagnosis Date  . Anemia   . 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 CHF (congestive heart failure) ([place])   . Chronic kidney disease   . COPD (chronic obstructive pulmonary disease) ([place])    NO INHALERS  . Diabetes mellitus    NO MEDS  . GERD (gastroesophageal reflux disease)   . Hepatic cirrhosis ([place])   . Hypertension   . Leg swelling   . Loose, teeth    PATIENT STATES "HAS 6 TEETH, SOME ARE LOOSE"  . Neuromuscular disorder ([place])    RIGHT HAND AND FINGERS NUMB  . Shortness of breath dyspnea    WITH EXERTION   . 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  He is getting PRBC transfusion. His vital signs are stable.  Is a 67 year old male admitted for anemia. 1. Anemia: Multifactorial including MGUS,chronic kidney disease, chronic disease and possibly GI losses. The latter have been explored extensively in the past. Notably the patient has recurrent gastritis regularly takes Pepto-Bismol and iron which can cause positive Hemoccult stools. He has had EGD and colonoscopy in the past which have not shown polyps,lesions or bleeding vessels.  Hemoglobin 9.5 after 2 units PRBC transfusion.  Hold aspirin.  2. Melena: No gross blood reported however the patient is Hemoccult positive. Patient has no active bleeding.  Dr. Alice Reichert suggested  no endoscopy at this time. 3. Hypertension: continue amlodipine, hydralazine and chlorthalidone. Labetalol as needed. 4. CKD: Stage III; hydrated with intravenous fluid. Avoid nephrotoxic agents.  Stable. 5. DVT prophylaxis: SCDs 6. GI prophylaxis: Changed to p.o. Protonix.  Continue home omeprazole after discharge. Tobacco abuse.  Smoking cessation was counseled for 4 minutes. DISCHARGE CONDITIONS:  Stable, discharge to home today. CONSULTS OBTAINED:  Treatment Team:  [name], MD DRUG ALLERGIES:  No Known Allergies DISCHARGE MEDICATIONS:   Allergies as of 11/18/2017   No Known Allergies     Medication List    STOP taking these medications   aspirin EC 81 MG tablet     TAKE these medications   amLODipine 10 MG tablet Commonly known as:  NORVASC Take 10 mg by mouth every morning.   atorvastatin 40 MG tablet Commonly known as:  LIPITOR Take 40 mg by mouth every morning.   bismuth subsalicylate 093 AT/55DD suspension Commonly known as:  PEPTO BISMOL Take 30 mLs by mouth every 6 (six) hours as needed for indigestion or diarrhea or loose stools.   chlorthalidone 25 MG tablet Commonly known as:  HYGROTON Take 25 mg by mouth every morning.   ferrous sulfate 325 (65 FE) MG tablet Take 325 mg by mouth daily.   hydrALAZINE 25 MG tablet Commonly known as:  APRESOLINE Take 25 mg 2 (two) times daily by mouth.   omeprazole 40 MG capsule Commonly known as:  PRILOSEC Take 40 mg by mouth every morning.   polyethylene glycol packet  Commonly known as:  MIRALAX / GLYCOLAX Take 17 g by mouth daily as needed for mild constipation.   vitamin B-12 1000 MCG tablet Commonly known as:  CYANOCOBALAMIN Take 1 tablet (1,000 mcg total) by mouth daily.   Vitamin D (Ergocalciferol) 50000 units Caps capsule Commonly known as:  DRISDOL Take 50,000 Units by mouth every 7 (seven) days. On Thursdays        DISCHARGE INSTRUCTIONS:  See AVS. If you experience  worsening of your admission symptoms, develop shortness of breath, life threatening emergency, suicidal or homicidal thoughts you must seek medical attention immediately by calling 911 or calling your MD immediately  if symptoms less severe.  You Must read complete instructions/literature along with all the possible adverse reactions/side effects for all the Medicines you take and that have been prescribed to you. Take any new Medicines after you have completely understood and accpet all the possible adverse reactions/side effects.   Please note  You were cared for by a hospitalist during your hospital stay. If you have any questions about your discharge medications or the care you received while you were in the hospital after you are discharged, you can call the unit and asked to speak with the hospitalist on call if the hospitalist that took care of you is not available. Once you are discharged, your primary care physician will handle any further medical issues. Please note that NO REFILLS for any discharge medications will be authorized once you are discharged, as it is imperative that you return to your primary care physician (or establish a relationship with a primary care physician if you do not have one) for your aftercare needs so that they can reassess your need for medications and monitor your lab values.    On the day of Discharge:  VITAL SIGNS:  Blood pressure (!) 162/89, pulse 72, temperature 98.2 F (36.8 C), temperature source Oral, resp. rate 18, height 6' (1.829 m), weight 84.3 kg, SpO2 96 %. PHYSICAL EXAMINATION:  GENERAL:  67 y.o.-year-old patient lying in the bed with no acute distress.  EYES: Pupils equal, round, reactive to light and accommodation. No scleral icterus. Extraocular muscles intact.  HEENT: Head atraumatic, normocephalic. Oropharynx and nasopharynx clear.  NECK:  Supple, no jugular venous distention. No thyroid enlargement, no tenderness.  LUNGS: Normal breath  sounds bilaterally, no wheezing, rales,rhonchi or crepitation. No use of accessory muscles of respiration.  CARDIOVASCULAR: S1, S2 normal. No murmurs, rubs, or gallops.  ABDOMEN: Soft, non-tender, non-distended. Bowel sounds present. No organomegaly or mass.  EXTREMITIES: No pedal edema, cyanosis, or clubbing.  NEUROLOGIC: Cranial nerves II through XII are intact. Muscle strength 5/5 in all extremities. Sensation intact. Gait not checked.  PSYCHIATRIC: The patient is alert and oriented x 3.  SKIN: No obvious rash, lesion, or ulcer.  DATA REVIEW:   CBC Recent Labs  Lab 11/16/17 2108 11/18/17 0339  WBC 8.6  --   HGB 7.0* 9.4*  HCT 21.1*  --   PLT 240  --     Chemistries  Recent Labs  Lab 11/16/17 2108  NA 136  K 4.1  CL 109  CO2 21*  GLUCOSE 159*  BUN 28*  CREATININE 2.04*  CALCIUM 8.4*     Microbiology Results  Results for orders placed or performed during the hospital encounter of 09/05/17  Aerobic Culture (superficial specimen)     Status: None   Collection Time: 09/05/17  9:30 AM  Result Value Ref Range Status   Specimen Description  Final    TOE [facility] TOE Performed at [facility], [address] [place], [place] [address]    Special Requests   Final    TOE Performed at [facility], [place], [place] [id number]    Gram Stain   Final    RARE WBC PRESENT, PREDOMINANTLY PMN MODERATE GRAM POSITIVE COCCI IN CHAINS    Culture   Final    ABUNDANT METHICILLIN RESISTANT STAPHYLOCOCCUS AUREUS ABUNDANT GROUP B STREP(S.AGALACTIAE)ISOLATED TESTING AGAINST S. AGALACTIAE NOT ROUTINELY PERFORMED DUE TO PREDICTABILITY OF AMP/PEN/VAN SUSCEPTIBILITY. Performed at [facility], [place] [address] [place], [place] [address]    Report Status 09/08/2017 FINAL  Final   Organism ID, Bacteria METHICILLIN RESISTANT STAPHYLOCOCCUS AUREUS  Final      Susceptibility   Methicillin resistant staphylococcus aureus - MIC*    CIPROFLOXACIN >=8  RESISTANT Resistant     ERYTHROMYCIN >=8 RESISTANT Resistant     GENTAMICIN <=0.5 SENSITIVE Sensitive     OXACILLIN >=4 RESISTANT Resistant     TETRACYCLINE >=16 RESISTANT Resistant     VANCOMYCIN <=0.5 SENSITIVE Sensitive     TRIMETH/SULFA <=10 SENSITIVE Sensitive     CLINDAMYCIN <=0.25 SENSITIVE Sensitive     RIFAMPIN <=0.5 SENSITIVE Sensitive     Inducible Clindamycin NEGATIVE Sensitive     * ABUNDANT METHICILLIN RESISTANT STAPHYLOCOCCUS AUREUS    RADIOLOGY:  No results found.   Management plans discussed with the patient, family and they are in agreement.  CODE STATUS: Full Code   TOTAL TIME TAKING CARE OF THIS PATIENT: 27 minutes.    Demetrios Loll M.D on 11/18/2017 at 8:42 AM  Between 7am to 6pm - Pager - [phone]  After 6pm go to [email or web address] - Proofreader  [facility]  Office  [phone]  CC: Primary care physician; [name], MD   Note: This dictation was prepared with Dragon dictation along with smaller phrase technology. Any transcriptional errors that result from this process are unintentional.

## 2017-11-18 NOTE — Progress Notes (Signed)
Per OR nurse, Dr. Bary Castilla has cancelled patient's scheduled procedure.  Dr. Bridgett Larsson made aware.  Clarise Cruz, RN, BSN

## 2017-11-19 LAB — HIV ANTIBODY (ROUTINE TESTING W REFLEX): HIV Screen 4th Generation wRfx: NONREACTIVE

## 2017-11-20 ENCOUNTER — Telehealth: Payer: Self-pay

## 2017-11-20 NOTE — Telephone Encounter (Signed)
-----   Message from Robert Bellow, MD sent at 11/18/2017  9:43 AM EDT ----- Surgery cancelled on Aug 12 secondary to some medical issues. Patient will need to be rescheduled in the next few weeks.

## 2017-11-20 NOTE — Telephone Encounter (Signed)
Spoke to the patient about rescheduling his surgery. The patient is rescheduled for surgery at [facility] on 12/16/17. He will pre admit by phone. He will be seen in office prior to that on 11/26/17. The patient is aware of date and instructions.

## 2017-11-25 NOTE — Progress Notes (Signed)
[name] ([id number]) Visit Report for 11/14/2017 Arrival Information Details Patient Name: [name], [name] Date of Service: 11/14/2017 8:30 AM Medical Record Number: [id number] Patient Account Number: [id number] Date of Birth/Sex: 12/09/1950 (67 y.o. M) Treating RN: Montey Hora Primary Care Daksha Koone: Lamonte Sakai Other Clinician: Referring Esiquio Boesen: Lamonte Sakai Treating Raygen Linquist/Extender: Cathie Olden in Treatment: 63 Visit Information History Since Last Visit Added or deleted any medications: No Patient Arrived: Ambulatory Any new allergies or adverse reactions: No Arrival Time: 08:29 Had a fall or experienced change in No Accompanied By: brother activities of daily living that may affect Transfer Assistance: None risk of falls: Patient Identification Verified: Yes Signs or symptoms of abuse/neglect since last visito No Secondary Verification Process Completed: Yes Hospitalized since last visit: No Patient Requires Transmission-Based No Implantable device outside of the clinic excluding No Precautions: cellular tissue based products placed in the center Patient Has Alerts: Yes since last visit: Patient Alerts: DMII Has Dressing in Place as Prescribed: Yes Pain Present Now: No Electronic Signature(s) Signed: 11/14/2017 4:27:39 PM By: Montey Hora Entered By: Montey Hora on 11/14/2017 08:30:10 [name] ([id number]) -------------------------------------------------------------------------------- Clinic Level of Care Assessment Details Patient Name: [name], [name] Date of Service: 11/14/2017 8:30 AM Medical Record Number: [id number] Patient Account Number: [id number] Date of Birth/Sex: 09/02/1950 (67 y.o. M) Treating RN: Ahmed Prima Primary Care Asriel Westrup: Lamonte Sakai Other Clinician: Referring Jeanmarie Mccowen: Lamonte Sakai Treating Suellen Durocher/Extender: Cathie Olden in Treatment: 41 Clinic Level of Care Assessment Items TOOL 4 Quantity Score X -  Use when only an EandM is performed on FOLLOW-UP visit 1 0 ASSESSMENTS - Nursing Assessment / Reassessment X - Reassessment of Co-morbidities (includes updates in patient status) 1 10 X- 1 5 Reassessment of Adherence to Treatment Plan ASSESSMENTS - Wound and Skin Assessment / Reassessment []  - Simple Wound Assessment / Reassessment - one wound 0 X- 5 5 Complex Wound Assessment / Reassessment - multiple wounds []  - 0 Dermatologic / Skin Assessment (not related to wound area) ASSESSMENTS - Focused Assessment []  - Circumferential Edema Measurements - multi extremities 0 []  - 0 Nutritional Assessment / Counseling / Intervention []  - 0 Lower Extremity Assessment (monofilament, tuning fork, pulses) []  - 0 Peripheral Arterial Disease Assessment (using hand held doppler) ASSESSMENTS - Ostomy and/or Continence Assessment and Care []  - Incontinence Assessment and Management 0 []  - 0 Ostomy Care Assessment and Management (repouching, etc.) PROCESS - Coordination of Care X - Simple Patient / Family Education for ongoing care 1 15 []  - 0 Complex (extensive) Patient / Family Education for ongoing care []  - 0 Staff obtains Programmer, systems, Records, Test Results / Process Orders []  - 0 Staff telephones HHA, Nursing Homes / Clarify orders / etc []  - 0 Routine Transfer to another Facility (non-emergent condition) []  - 0 Routine Hospital Admission (non-emergent condition) []  - 0 New Admissions / Biomedical engineer / Ordering NPWT, Apligraf, etc. []  - 0 Emergency Hospital Admission (emergent condition) X- 1 10 Simple Discharge Coordination [name], [name] ([id number]) []  - 0 Complex (extensive) Discharge Coordination PROCESS - Special Needs []  - Pediatric / Minor Patient Management 0 []  - 0 Isolation Patient Management []  - 0 Hearing / Language / Visual special needs []  - 0 Assessment of Community assistance (transportation, D/C planning, etc.) []  - 0 Additional assistance / Altered  mentation []  - 0 Support Surface(s) Assessment (bed, cushion, seat, etc.) INTERVENTIONS - Wound Cleansing / Measurement []  - Simple Wound Cleansing - one wound 0 X- 5 5 Complex Wound  Cleansing - multiple wounds X- 1 5 Wound Imaging (photographs - any number of wounds) []  - 0 Wound Tracing (instead of photographs) []  - 0 Simple Wound Measurement - one wound X- 5 5 Complex Wound Measurement - multiple wounds INTERVENTIONS - Wound Dressings []  - Small Wound Dressing one or multiple wounds 0 X- 2 15 Medium Wound Dressing one or multiple wounds []  - 0 Large Wound Dressing one or multiple wounds X- 1 5 Application of Medications - topical []  - 0 Application of Medications - injection INTERVENTIONS - Miscellaneous []  - External ear exam 0 []  - 0 Specimen Collection (cultures, biopsies, blood, body fluids, etc.) []  - 0 Specimen(s) / Culture(s) sent or taken to Lab for analysis []  - 0 Patient Transfer (multiple staff / Civil Service fast streamer / Similar devices) []  - 0 Simple Staple / Suture removal (25 or less) []  - 0 Complex Staple / Suture removal (26 or more) []  - 0 Hypo / Hyperglycemic Management (close monitor of Blood Glucose) []  - 0 Ankle / Brachial Index (ABI) - do not check if billed separately X- 1 5 Vital Signs Geisler, Nikolai E. ([id number]) Has the patient been seen at the hospital within the last three years: Yes Total Score: 160 Level Of Care: New/Established - Level 5 Electronic Signature(s) Signed: 11/14/2017 4:46:33 PM By: Alric Quan Entered By: Alric Quan on 11/14/2017 08:44:55 Muhlbauer, Wallace [name] ([id number]) -------------------------------------------------------------------------------- Encounter Discharge Information Details Patient Name: [name], [name] Date of Service: 11/14/2017 8:30 AM Medical Record Number: [id number] Patient Account Number: [id number] Date of Birth/Sex: July 25, 1950 (67 y.o. M) Treating RN: Ahmed Prima Primary Care Larnie Heart: Lamonte Sakai Other Clinician: Referring Bunyan Brier: Lamonte Sakai Treating Bobby Ragan/Extender: Cathie Olden in Treatment: 43 Encounter Discharge Information Items Discharge Condition: Stable Ambulatory Status: Ambulatory Discharge Destination: Home Transportation: Private Auto Schedule Follow-up Appointment: Yes Clinical Summary of Care: Electronic Signature(s) Signed: 11/14/2017 4:46:33 PM By: Alric Quan Entered By: Alric Quan on 11/14/2017 08:56:46 Six, Wallace [name] ([id number]) -------------------------------------------------------------------------------- Lower Extremity Assessment Details Patient Name: Tromp, Detron E. Date of Service: 11/14/2017 8:30 AM Medical Record Number: [id number] Patient Account Number: [id number] Date of Birth/Sex: Aug 13, 1950 (67 y.o. M) Treating RN: Montey Hora Primary Care Callin Ashe: Lamonte Sakai Other Clinician: Referring Dorreen Valiente: Lamonte Sakai Treating Ginette Bradway/Extender: Cathie Olden in Treatment: 59 Edema Assessment Assessed: [Left: No] [Right: No] [Left: Edema] [Right: :] Calf Left: Right: Point of Measurement: 34 cm From Medial Instep 35.7 cm 36.8 cm Ankle Left: Right: Point of Measurement: 12 cm From Medial Instep 23.5 cm 24.5 cm Vascular Assessment Pulses: Dorsalis Pedis Palpable: [Left:Yes] [Right:Yes] Posterior Tibial Extremity colors, hair growth, and conditions: Extremity Color: [Left:Hyperpigmented] [Right:Hyperpigmented] Hair Growth on Extremity: [Left:Yes] [Right:Yes] Temperature of Extremity: [Left:Warm] [Right:Warm] Capillary Refill: [Left:< 3 seconds] [Right:< 3 seconds] Toe Nail Assessment Left: Right: Thick: Yes Yes Discolored: Yes Yes Deformed: Yes Yes Improper Length and Hygiene: No No Electronic Signature(s) Signed: 11/14/2017 4:27:39 PM By: Montey Hora Entered By: Montey Hora on 11/14/2017 08:38:21 Grandison, Copper E.  ([id number]) -------------------------------------------------------------------------------- Multi Wound Chart Details Patient Name: Chavana, Mahlon E. Date of Service: 11/14/2017 8:30 AM Medical Record Number: [id number] Patient Account Number: [id number] Date of Birth/Sex: [birth year] (67 y.o. M) Treating RN: Ahmed Prima Primary Care Mikela Senn: Lamonte Sakai Other Clinician: Referring Lasundra Hascall: Lamonte Sakai Treating Tyjay Galindo/Extender: Cathie Olden in Treatment: 73 Vital Signs Height(in): 69 Pulse(bpm): 75 Weight(lbs): 168 Blood Pressure(mmHg): 158/81 Body Mass Index(BMI): 25 Temperature(F): 97.7 Respiratory Rate 16 (breaths/min): Photos: [1:No Photos] [14:No Photos] [16:No Photos] Wound Location: [1:Right Toe Great] [  14:Left Toe Second] [16:Right Toe Great - Plantar] Wounding Event: [1:Gradually Appeared] [14:Gradually Appeared] [16:Not Known] 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] Comorbid History: [1:Anemia, Lymphedema, Congestive Heart Failure, Hypertension, Peripheral Venous Disease, Type II Diabetes, Neuropathy] [14:Anemia, Lymphedema, Congestive Heart Failure, Hypertension, Peripheral Venous Disease, Type II Diabetes,  Neuropathy] [16:Anemia, Lymphedema, Congestive Heart Failure, Hypertension, Peripheral Venous Disease, Type II Diabetes, Neuropathy] Date Acquired: [1:06/11/2016] [14:09/26/2017] [16:10/29/2017] Weeks of Treatment: [1:59] [14:7] [16:2] Wound Status: [1:Open] [14:Open] [16:Open] Pending Amputation on [1:Yes] [14:No] [16:No] Presentation: Measurements L x W x D [1:0.6x0.6x0.1] [14:0.3x0.3x0.1] [16:0.5x0.4x0.1] (cm) Area (cm) : [1:0.283] [14:0.071] [16:0.157] Volume (cm) : [1:0.028] [14:0.007] [16:0.016] % Reduction in Area: [1:99.40%] [14:97.00%] [16:90.20%] % Reduction in Volume: [1:99.40%] [14:97.00%] [16:90.00%] Classification:  [1:Grade 2] [14:Grade 1] [16:Grade 1] Exudate Amount: [1:Small] [14:Medium] [16:Medium] Exudate Type: [1:Serous] [14:Serous] [16:Serous] Exudate Color: [1:amber] [14:amber] [16:amber] Wound Margin: [1:Flat and Intact] [14:Flat and Intact] [16:Flat and Intact] Granulation Amount: [1:Large (67-100%)] [14:Large (67-100%)] [16:Large (67-100%)] Granulation Quality: [1:Pink] [14:Pink] [16:Pink] Necrotic Amount: [1:None Present (0%)] [14:None Present (0%)] [16:None Present (0%)] Exposed Structures: [1:Fat Layer (Subcutaneous Tissue) Exposed: Yes Fascia: No Tendon: No Muscle: No Joint: No Bone: No] [14:Fascia: No Fat Layer (Subcutaneous Tissue) Exposed: No Tendon: No Muscle: No Joint: No Bone: No] [16:Fat Layer (Subcutaneous Tissue) Exposed: Yes  Fascia: No Tendon: No Muscle: No Joint: No Bone: No] Epithelialization: [1:Medium (34-66%)] [14:Medium (34-66%)] [16:Small (1-33%)] Periwound Skin Texture: Excoriation: No Excoriation: No Excoriation: No Induration: No Induration: No Induration: No Callus: No Callus: No Callus: No Crepitus: No Crepitus: No Crepitus: No Rash: No Rash: No Rash: No Scarring: No Scarring: No Scarring: No Periwound Skin Moisture: Maceration: No Maceration: No Maceration: No Dry/Scaly: No Dry/Scaly: No Dry/Scaly: No Periwound Skin Color: Atrophie Blanche: No Atrophie Blanche: No Atrophie Blanche: No Cyanosis: No Cyanosis: No Cyanosis: No Ecchymosis: No Ecchymosis: No Ecchymosis: No Erythema: No Erythema: No Erythema: No Hemosiderin Staining: No Hemosiderin Staining: No Hemosiderin Staining: No Mottled: No Mottled: No Mottled: No Pallor: No Pallor: No Pallor: No Rubor: No Rubor: No Rubor: No Temperature: No Abnormality No Abnormality No Abnormality Tenderness on Palpation: No No No Wound Preparation: Ulcer Cleansing: Ulcer Cleansing: Ulcer Cleansing: Rinsed/Irrigated with Saline Rinsed/Irrigated with Saline Rinsed/Irrigated with  Saline Topical Anesthetic Applied: Topical Anesthetic Applied: Topical Anesthetic Applied: Other: lidocaine 4% Other: lidocaine 4% Other: lidocaine 4% Wound Number: 2 5 N/A Photos: No Photos No Photos N/A Wound Location: Right Toe Second Left Toe Great N/A Wounding Event: Gradually Appeared Gradually Appeared N/A Primary Etiology: Diabetic Wound/Ulcer of the Diabetic Wound/Ulcer of the N/A Lower Extremity Lower Extremity Comorbid History: Anemia, Lymphedema, Anemia, Lymphedema, N/A Congestive Heart Failure, Congestive Heart Failure, Hypertension, Peripheral Hypertension, Peripheral Venous Disease, Type II Venous Disease, Type II Diabetes, Neuropathy Diabetes, Neuropathy Date Acquired: 06/11/2016 06/11/2016 N/A Weeks of Treatment: 59 59 N/A Wound Status: Open Open N/A Pending Amputation on Yes Yes N/A Presentation: Measurements L x W x D 0.7x0.8x0.1 0.3x0.2x0.1 N/A (cm) Area (cm) : 0.44 0.047 N/A Volume (cm) : 0.044 0.005 N/A % Reduction in Area: 85.70% 99.70% N/A % Reduction in Volume: 85.70% 99.70% N/A Classification: Grade 2 Grade 2 N/A Exudate Amount: Medium Medium N/A Exudate Type: Serous Serous N/A Exudate Color: amber amber N/A Wound Margin: Flat and Intact Flat and Intact N/A Granulation Amount: Large (67-100%) Large (67-100%) N/A Granulation Quality: Pink Pink N/A Necrotic Amount: None Present (0%) None Present (0%) N/A Exposed Structures: Fascia: No Fat Layer (  Subcutaneous N/A Fat Layer (Subcutaneous Tissue) Exposed: Yes Tissue) Exposed: No Fascia: No Tendon: No Tendon: No Warren, Wilfrid E. ([id number]) Muscle: No Muscle: No Joint: No Joint: No Bone: No Bone: No Epithelialization: Medium (34-66%) Medium (34-66%) N/A Periwound Skin Texture: Excoriation: No Excoriation: No N/A Induration: No Induration: No Callus: No Callus: No Crepitus: No Crepitus: No Rash: No Rash: No Scarring: No Scarring: No Periwound Skin Moisture: Maceration: No Maceration:  No N/A Dry/Scaly: No Dry/Scaly: No Periwound Skin Color: Atrophie Blanche: No Atrophie Blanche: No N/A Cyanosis: No Cyanosis: No Ecchymosis: No Ecchymosis: No Erythema: No Erythema: No Hemosiderin Staining: No Hemosiderin Staining: No Mottled: No Mottled: No Pallor: No Pallor: No Rubor: No Rubor: No Temperature: No Abnormality No Abnormality N/A Tenderness on Palpation: No No N/A Wound Preparation: Ulcer Cleansing: Ulcer Cleansing: N/A Rinsed/Irrigated with Saline Rinsed/Irrigated with Saline Topical Anesthetic Applied: Topical Anesthetic Applied: Other: lidocaine 4% Other: lidocaine 4% Treatment Notes Electronic Signature(s) Signed: 11/14/2017 8:55:11 AM By: Lawanda Cousins Entered By: Lawanda Cousins on 11/14/2017 08:55:11 Magnussen, Wallace [name] ([id number]) -------------------------------------------------------------------------------- [place] Details Patient Name: Hauss, Danial E. Date of Service: 11/14/2017 8:30 AM Medical Record Number: [id number] Patient Account Number: [id number] Date of Birth/Sex: April 18, 1950 (67 y.o. M) Treating RN: Ahmed Prima Primary Care Mata Rowen: Lamonte Sakai Other Clinician: Referring Nori Poland: Lamonte Sakai Treating Tremeka Helbling/Extender: Cathie Olden in Treatment: 82 Active Inactive ` Abuse / Safety / Falls / Self Care Management Nursing Diagnoses: Potential for falls Goals: Patient will remain injury free related to falls Date Initiated: 09/21/2016 Target Resolution Date: 09/14/2017 Goal Status: Active Interventions: Assess fall risk on admission and as needed Notes: ` Nutrition Nursing Diagnoses: Potential for alteratiion in Nutrition/Potential for imbalanced nutrition Goals: Patient/caregiver agrees to and verbalizes understanding of need to use nutritional supplements and/or vitamins as prescribed Date Initiated: 09/21/2016 Target Resolution Date: 09/14/2017 Goal Status: Active Interventions: Assess patient  nutrition upon admission and as needed per policy Notes: ` Orientation to the Wound Care Program Nursing Diagnoses: Knowledge deficit related to the wound healing center program Goals: Patient/caregiver will verbalize understanding of the [place] Program Date Initiated: 09/21/2016 Target Resolution Date: 06/15/2017 Goal Status: Active Interventions: DARKTeyton, Pattillo ([id number]) Provide education on orientation to the wound center Notes: ` Wound/Skin Impairment Nursing Diagnoses: Knowledge deficit related to smoking impact on wound healing Goals: Ulcer/skin breakdown will have a volume reduction of 30% by week 4 Date Initiated: 09/21/2016 Target Resolution Date: 08/17/2017 Goal Status: Active Ulcer/skin breakdown will have a volume reduction of 50% by week 8 Date Initiated: 09/21/2016 Target Resolution Date: 08/17/2017 Goal Status: Active Ulcer/skin breakdown will have a volume reduction of 80% by week 12 Date Initiated: 09/21/2016 Target Resolution Date: 09/14/2017 Goal Status: Active Ulcer/skin breakdown will heal within 14 weeks Date Initiated: 09/21/2016 Target Resolution Date: 08/17/2017 Goal Status: Active Interventions: Assess patient/caregiver ability to obtain necessary supplies Assess patient/caregiver ability to perform ulcer/skin care regimen upon admission and as needed Assess ulceration(s) every visit Notes: Electronic Signature(s) Signed: 11/14/2017 4:46:33 PM By: Alric Quan Entered By: Alric Quan on 11/14/2017 08:40:09 Olesen, Zailen E. ([id number]) -------------------------------------------------------------------------------- Pain Assessment Details Patient Name: Bonelli, Koleton E. Date of Service: 11/14/2017 8:30 AM Medical Record Number: [id number] Patient Account Number: [id number] Date of Birth/Sex: September 18, 1950 (67 y.o. M) Treating RN: Montey Hora Primary Care Eoin Willden: Lamonte Sakai Other Clinician: Referring Le Faulcon: Lamonte Sakai Treating Gola Bribiesca/Extender: Cathie Olden in Treatment: 80 Active Problems Location of Pain Severity and Description of Pain Patient Has Paino Yes Site Locations Pain Location:  Generalized Pain With Dressing Change: No Duration of the Pain. Constant / Intermittento Intermittent Pain Management and Medication Current Pain Management: Goals for Pain Management sometimes when i walk they hurt on top Electronic Signature(s) Signed: 11/14/2017 4:27:39 PM By: Montey Hora Entered By: Montey Hora on 11/14/2017 08:30:33 Ziebarth, Wallace [name] ([id number]) -------------------------------------------------------------------------------- Patient/Caregiver Education Details Patient Name: Ortner, Rodney E. Date of Service: 11/14/2017 8:30 AM Medical Record Number: [id number] Patient Account Number: [id number] Date of Birth/Gender: 07/12/1950 (67 y.o. M) Treating RN: Ahmed Prima Primary Care Physician: Lamonte Sakai Other Clinician: Referring Physician: Lamonte Sakai Treating Physician/Extender: Cathie Olden in Treatment: 22 Education Assessment Education Provided To: Patient Education Topics Provided Wound/Skin Impairment: Handouts: Caring for Your Ulcer Methods: Explain/Verbal Responses: State content correctly Electronic Signature(s) Signed: 11/14/2017 4:46:33 PM By: Alric Quan Entered By: Alric Quan on 11/14/2017 08:56:57 Ermis, Jamichael E. ([id number]) -------------------------------------------------------------------------------- Wound Assessment Details Patient Name: Babington, Dontaye E. Date of Service: 11/14/2017 8:30 AM Medical Record Number: [id number] Patient Account Number: [id number] Date of Birth/Sex: February 07, 1951 (67 y.o. M) Treating RN: Montey Hora Primary Care Jacqulin Brandenburger: Lamonte Sakai Other Clinician: Referring Jarett Dralle: Lamonte Sakai Treating Kyrin Garn/Extender: Cathie Olden in Treatment: 59 Wound Status Wound Number: 1 Primary Diabetic  Wound/Ulcer of the Lower Extremity Etiology: Wound Location: Right Toe Great Wound Open Wounding Event: Gradually Appeared Status: Date Acquired: 06/11/2016 Comorbid Anemia, Lymphedema, Congestive Heart Weeks Of Treatment: 59 History: Failure, Hypertension, Peripheral Venous Clustered Wound: No Disease, Type II Diabetes, Neuropathy Pending Amputation On Presentation Photos Photo Uploaded By: Montey Hora on 11/14/2017 09:08:32 Wound Measurements Length: (cm) 0.6 Width: (cm) 0.6 Depth: (cm) 0.1 Area: (cm) 0.283 Volume: (cm) 0.028 % Reduction in Area: 99.4% % Reduction in Volume: 99.4% Epithelialization: Medium (34-66%) Tunneling: No Undermining: No Wound Description Classification: Grade 2 Foul O Wound Margin: Flat and Intact Slough Exudate Amount: Small Exudate Type: Serous Exudate Color: amber dor After Cleansing: No /Fibrino No Wound Bed Granulation Amount: Large (67-100%) Exposed Structure Granulation Quality: Pink Fascia Exposed: No Necrotic Amount: None Present (0%) Fat Layer (Subcutaneous Tissue) Exposed: Yes Tendon Exposed: No Muscle Exposed: No Joint Exposed: No Bone Exposed: No Periwound Skin Texture Kowalczyk, Kenan E. ([id number]) Texture Color No Abnormalities Noted: No No Abnormalities Noted: No Callus: No Atrophie Blanche: No Crepitus: No Cyanosis: No Excoriation: No Ecchymosis: No Induration: No Erythema: No Rash: No Hemosiderin Staining: No Scarring: No Mottled: No Pallor: No Moisture Rubor: No No Abnormalities Noted: No Dry / Scaly: No Temperature / Pain Maceration: No Temperature: No Abnormality Wound Preparation Ulcer Cleansing: Rinsed/Irrigated with Saline Topical Anesthetic Applied: Other: lidocaine 4%, Treatment Notes Wound #1 (Right Toe Great) 1. Cleansed with: Clean wound with Normal Saline 2. Anesthetic Topical Lidocaine 4% cream to wound bed prior to debridement 4. Dressing Applied: Other dressing (specify in  notes) 5. Secondary Dressing Applied Dry Gauze Kerlix/Conform Electronic Signature(s) Signed: 11/14/2017 4:27:39 PM By: Montey Hora Entered By: Montey Hora on 11/14/2017 08:35:17 Satchell, Wallace [name] ([id number]) -------------------------------------------------------------------------------- Wound Assessment Details Patient Name: Dicenso, Cordale E. Date of Service: 11/14/2017 8:30 AM Medical Record Number: [id number] Patient Account Number: [id number] Date of Birth/Sex: 1950-10-21 (67 y.o. M) Treating RN: Montey Hora Primary Care Burleigh Brockmann: Lamonte Sakai Other Clinician: Referring Ardelia Wrede: Lamonte Sakai Treating Francess Mullen/Extender: Cathie Olden in Treatment: 59 Wound Status Wound Number: 14 Primary Diabetic Wound/Ulcer of the Lower Extremity Etiology: Wound Location: Left Toe Second Wound Open Wounding Event: Gradually Appeared Status: Date Acquired: 09/26/2017 Comorbid Anemia, Lymphedema, Congestive Heart Weeks Of Treatment: 7 History: Failure, Hypertension, Peripheral Venous Clustered  Wound: No Disease, Type II Diabetes, Neuropathy Photos Photo Uploaded By: Montey Hora on 11/14/2017 09:08:33 Wound Measurements Length: (cm) 0.3 Width: (cm) 0.3 Depth: (cm) 0.1 Area: (cm) 0.071 Volume: (cm) 0.007 % Reduction in Area: 97% % Reduction in Volume: 97% Epithelialization: Medium (34-66%) Tunneling: No Undermining: No Wound Description Classification: Grade 1 Foul O Wound Margin: Flat and Intact Slough Exudate Amount: Medium Exudate Type: Serous Exudate Color: amber dor After Cleansing: No /Fibrino No Wound Bed Granulation Amount: Large (67-100%) Exposed Structure Granulation Quality: Pink Fascia Exposed: No Necrotic Amount: None Present (0%) Fat Layer (Subcutaneous Tissue) Exposed: No Tendon Exposed: No Muscle Exposed: No Joint Exposed: No Bone Exposed: No Periwound Skin Texture Safranek, Tahjir E. ([id number]) Texture Color No Abnormalities Noted: No No  Abnormalities Noted: No Callus: No Atrophie Blanche: No Crepitus: No Cyanosis: No Excoriation: No Ecchymosis: No Induration: No Erythema: No Rash: No Hemosiderin Staining: No Scarring: No Mottled: No Pallor: No Moisture Rubor: No No Abnormalities Noted: No Dry / Scaly: No Temperature / Pain Maceration: No Temperature: No Abnormality Wound Preparation Ulcer Cleansing: Rinsed/Irrigated with Saline Topical Anesthetic Applied: Other: lidocaine 4%, Treatment Notes Wound #14 (Left Toe Second) 1. Cleansed with: Clean wound with Normal Saline 2. Anesthetic Topical Lidocaine 4% cream to wound bed prior to debridement 4. Dressing Applied: Other dressing (specify in notes) 5. Secondary Dressing Applied Dry Gauze Kerlix/Conform Electronic Signature(s) Signed: 11/14/2017 4:27:39 PM By: Montey Hora Entered By: Montey Hora on 11/14/2017 08:35:38 Keltner, Wallace [name] ([id number]) -------------------------------------------------------------------------------- Wound Assessment Details Patient Name: Watson, Nicole E. Date of Service: 11/14/2017 8:30 AM Medical Record Number: [id number] Patient Account Number: [id number] Date of Birth/Sex: 12/29/1950 (67 y.o. M) Treating RN: Montey Hora Primary Care Rubby Barbary: Lamonte Sakai Other Clinician: Referring Karmelo Bass: Lamonte Sakai Treating Bettie Capistran/Extender: Cathie Olden in Treatment: 59 Wound Status Wound Number: 16 Primary Diabetic Wound/Ulcer of the Lower Extremity Etiology: Wound Location: Right Toe Great - Plantar Wound Open Wounding Event: Not Known Status: Date Acquired: 10/29/2017 Comorbid Anemia, Lymphedema, Congestive Heart Weeks Of Treatment: 2 History: Failure, Hypertension, Peripheral Venous Clustered Wound: No Disease, Type II Diabetes, Neuropathy Photos Photo Uploaded By: Montey Hora on 11/14/2017 09:08:53 Wound Measurements Length: (cm) 0.5 Width: (cm) 0.4 Depth: (cm) 0.1 Area: (cm) 0.157 Volume: (cm)  0.016 % Reduction in Area: 90.2% % Reduction in Volume: 90% Epithelialization: Small (1-33%) Tunneling: No Undermining: No Wound Description Classification: Grade 1 Wound Margin: Flat and Intact Exudate Amount: Medium Exudate Type: Serous Exudate Color: amber Foul Odor After Cleansing: No Slough/Fibrino Yes Wound Bed Granulation Amount: Large (67-100%) Exposed Structure Granulation Quality: Pink Fascia Exposed: No Necrotic Amount: None Present (0%) Fat Layer (Subcutaneous Tissue) Exposed: Yes Tendon Exposed: No Muscle Exposed: No Joint Exposed: No Bone Exposed: No Periwound Skin Texture Durney, Wei E. ([id number]) Texture Color No Abnormalities Noted: No No Abnormalities Noted: No Callus: No Atrophie Blanche: No Crepitus: No Cyanosis: No Excoriation: No Ecchymosis: No Induration: No Erythema: No Rash: No Hemosiderin Staining: No Scarring: No Mottled: No Pallor: No Moisture Rubor: No No Abnormalities Noted: No Dry / Scaly: No Temperature / Pain Maceration: No Temperature: No Abnormality Wound Preparation Ulcer Cleansing: Rinsed/Irrigated with Saline Topical Anesthetic Applied: Other: lidocaine 4%, Treatment Notes Wound #16 (Right, Plantar Toe Great) 1. Cleansed with: Clean wound with Normal Saline 2. Anesthetic Topical Lidocaine 4% cream to wound bed prior to debridement 4. Dressing Applied: Other dressing (specify in notes) 5. Secondary Dressing Applied Dry Gauze Kerlix/Conform Electronic Signature(s) Signed: 11/14/2017 4:27:39 PM By: Montey Hora Entered By: Montey Hora  on 11/14/2017 08:35:51 Cervantes, Davarion E. ([id number]) -------------------------------------------------------------------------------- Wound Assessment Details Patient Name: Mills, DEVERICK PRUSS. Date of Service: 11/14/2017 8:30 AM Medical Record Number: [id number] Patient Account Number: [id number] Date of Birth/Sex: [birth year] (67 y.o. M) Treating RN: Montey Hora Primary Care  Athziry Millican: Lamonte Sakai Other Clinician: Referring Tehya Leath: Lamonte Sakai Treating Chania Kochanski/Extender: Cathie Olden in Treatment: 59 Wound Status Wound Number: 2 Primary Diabetic Wound/Ulcer of the Lower Extremity Etiology: Wound Location: Right Toe Second Wound Open Wounding Event: Gradually Appeared Status: Date Acquired: 06/11/2016 Comorbid Anemia, Lymphedema, Congestive Heart Weeks Of Treatment: 59 History: Failure, Hypertension, Peripheral Venous Clustered Wound: No Disease, Type II Diabetes, Neuropathy Pending Amputation On Presentation Photos Photo Uploaded By: Montey Hora on 11/14/2017 09:08:54 Wound Measurements Length: (cm) 0.7 Width: (cm) 0.8 Depth: (cm) 0.1 Area: (cm) 0.44 Volume: (cm) 0.044 % Reduction in Area: 85.7% % Reduction in Volume: 85.7% Epithelialization: Medium (34-66%) Tunneling: No Undermining: No Wound Description Classification: Grade 2 Foul O Wound Margin: Flat and Intact Slough Exudate Amount: Medium Exudate Type: Serous Exudate Color: amber dor After Cleansing: No /Fibrino No Wound Bed Granulation Amount: Large (67-100%) Exposed Structure Granulation Quality: Pink Fascia Exposed: No Necrotic Amount: None Present (0%) Fat Layer (Subcutaneous Tissue) Exposed: No Tendon Exposed: No Muscle Exposed: No Joint Exposed: No Bone Exposed: No Periwound Skin Texture Montz, Jonael E. ([id number]) Texture Color No Abnormalities Noted: No No Abnormalities Noted: No Callus: No Atrophie Blanche: No Crepitus: No Cyanosis: No Excoriation: No Ecchymosis: No Induration: No Erythema: No Rash: No Hemosiderin Staining: No Scarring: No Mottled: No Pallor: No Moisture Rubor: No No Abnormalities Noted: No Dry / Scaly: No Temperature / Pain Maceration: No Temperature: No Abnormality Wound Preparation Ulcer Cleansing: Rinsed/Irrigated with Saline Topical Anesthetic Applied: Other: lidocaine 4%, Treatment Notes Wound #2 (Right Toe  Second) 1. Cleansed with: Clean wound with Normal Saline 2. Anesthetic Topical Lidocaine 4% cream to wound bed prior to debridement 4. Dressing Applied: Other dressing (specify in notes) 5. Secondary Dressing Applied Dry Gauze Kerlix/Conform Electronic Signature(s) Signed: 11/14/2017 4:27:39 PM By: Montey Hora Entered By: Montey Hora on 11/14/2017 08:36:03 Rawling, Wallace [name] ([id number]) -------------------------------------------------------------------------------- Wound Assessment Details Patient Name: Osland, Lane E. Date of Service: 11/14/2017 8:30 AM Medical Record Number: [id number] Patient Account Number: [id number] Date of Birth/Sex: [birth year] (67 y.o. M) Treating RN: Montey Hora Primary Care Sayer Masini: Lamonte Sakai Other Clinician: Referring Jazell Rosenau: Lamonte Sakai Treating Kemya Shed/Extender: Cathie Olden in Treatment: 59 Wound Status Wound Number: 5 Primary Diabetic Wound/Ulcer of the Lower Extremity Etiology: Wound Location: Left Toe Great Wound Open Wounding Event: Gradually Appeared Status: Date Acquired: 06/11/2016 Comorbid Anemia, Lymphedema, Congestive Heart Weeks Of Treatment: 59 History: Failure, Hypertension, Peripheral Venous Clustered Wound: No Disease, Type II Diabetes, Neuropathy Pending Amputation On Presentation Photos Photo Uploaded By: Montey Hora on 11/14/2017 09:09:16 Wound Measurements Length: (cm) 0.3 Width: (cm) 0.2 Depth: (cm) 0.1 Area: (cm) 0.047 Volume: (cm) 0.005 % Reduction in Area: 99.7% % Reduction in Volume: 99.7% Epithelialization: Medium (34-66%) Tunneling: No Undermining: No Wound Description Classification: Grade 2 Foul O Wound Margin: Flat and Intact Slough Exudate Amount: Medium Exudate Type: Serous Exudate Color: amber dor After Cleansing: No /Fibrino No Wound Bed Granulation Amount: Large (67-100%) Exposed Structure Granulation Quality: Pink Fascia Exposed: No Necrotic Amount: None Present  (0%) Fat Layer (Subcutaneous Tissue) Exposed: Yes Tendon Exposed: No Muscle Exposed: No Joint Exposed: No Bone Exposed: No Periwound Skin Texture Towry, Maximiliano E. ([id number]) Texture Color No Abnormalities Noted: No No Abnormalities Noted: No Callus: No Atrophie Blanche:  No Crepitus: No Cyanosis: No Excoriation: No Ecchymosis: No Induration: No Erythema: No Rash: No Hemosiderin Staining: No Scarring: No Mottled: No Pallor: No Moisture Rubor: No No Abnormalities Noted: No Dry / Scaly: No Temperature / Pain Maceration: No Temperature: No Abnormality Wound Preparation Ulcer Cleansing: Rinsed/Irrigated with Saline Topical Anesthetic Applied: Other: lidocaine 4%, Treatment Notes Wound #5 (Left Toe Great) 1. Cleansed with: Clean wound with Normal Saline 2. Anesthetic Topical Lidocaine 4% cream to wound bed prior to debridement 4. Dressing Applied: Other dressing (specify in notes) 5. Secondary Dressing Applied Dry Gauze Kerlix/Conform Electronic Signature(s) Signed: 11/14/2017 4:27:39 PM By: Montey Hora Entered By: Montey Hora on 11/14/2017 08:36:17 Zuleta, Wallace [name] ([id number]) -------------------------------------------------------------------------------- [place] Details Patient Name: Guerrier, Lane E. Date of Service: 11/14/2017 8:30 AM Medical Record Number: [id number] Patient Account Number: [id number] Date of Birth/Sex: 11/14/1950 (67 y.o. M) Treating RN: Montey Hora Primary Care Amora Sheehy: Lamonte Sakai Other Clinician: Referring Aashvi Rezabek: Lamonte Sakai Treating Aiyanah Kalama/Extender: Cathie Olden in Treatment: 57 Vital Signs Time Taken: 08:30 Temperature (F): 97.7 Height (in): 69 Pulse (bpm): 75 Weight (lbs): 168 Respiratory Rate (breaths/min): 16 Body Mass Index (BMI): 24.8 Blood Pressure (mmHg): 158/81 Reference Range: 80 - 120 mg / dl Electronic Signature(s) Signed: 11/14/2017 4:27:39 PM By: Montey Hora Entered By: Montey Hora on  11/14/2017 08:30:55

## 2017-11-25 NOTE — Progress Notes (Signed)
[name] ([id number]) Visit Report for 11/14/2017 Chief Complaint Document Details Patient Name: [name], [name] Date of Service: 11/14/2017 8:30 AM Medical Record Number: [id number] Patient Account Number: [id number] Date of Birth/Sex: February 02, 1951 (67 y.o. M) Treating RN: Ahmed Prima Primary Care Provider: Lamonte Sakai Other Clinician: Referring Provider: Lamonte Sakai Treating Provider/Extender: Cathie Olden in Treatment: 14 Information Obtained from: Patient Chief Complaint Patient presents for treatment of an open diabetic ulcer to both feet Electronic Signature(s) Signed: 11/14/2017 8:55:20 AM By: Lawanda Cousins Entered By: Lawanda Cousins on 11/14/2017 08:55:20 [name], [name] ([id number]) -------------------------------------------------------------------------------- HPI Details Patient Name: [name], [name] Date of Service: 11/14/2017 8:30 AM Medical Record Number: [id number] Patient Account Number: [id number] Date of Birth/Sex: Dec 25, 1950 (67 y.o. M) Treating RN: Ahmed Prima Primary Care Provider: Lamonte Sakai Other Clinician: Referring Provider: Lamonte Sakai Treating Provider/Extender: Cathie Olden in Treatment: 105 History of Present Illness Location: bilateral feet ulceration on the toes Quality: Patient reports experiencing a dull pain to affected area(s). Severity: Patient states wound are getting better Duration: Patient has had the wound for > 3 months prior to seeking treatment at the wound center Timing: Pain in wound is constant (hurts all the time) Context: The wound would happen gradually Modifying Factors: Other treatment(s) tried include:treatment for lymphedema and is seen by the podiatrist Dr. Caryl Comes Associated Signs and Symptoms: Patient reports having increase swelling. HPI Description: 67 year old patient here to see as for bilateral feet ulceration to on his left first and second toe and 2 on his right first and second toe, which she's had  for about 4 months. 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 Donella Stade. The patient is also undergoing workup by medical oncology for a intra-abdominal lymphadenopathy. 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 He is also status post appendectomy, inguinal lymph node biopsy, rectal biopsy and rectal examination under anesthesia. he currently smokes cigarettes about half packet a day. In March of this year he was seen by Dr. Hortencia Pilar, for evaluation of bilateral varicose veins and besides wearing compression stockings he had recommended laser ablation of the right and left great saphenous veins to eleviate the symptoms and complications of severe superficial venous reflux disease. He also recommended lymphedema pumps for better control of his lymphedema. The patient recently has had on 08/23/2016, right greater saphenous vein ablation with the laser energy Earlier lower extremity venous reflux examination done on 05/08/2016 showed no DVT or SVT both lower legs but incompetence of bilateral great saphenous veins was present. A lower arterial study was also done and there was no significant right lower and left lower extremity problems based on a normal toe brachial index bilaterally and the ABI was 1.21 the left and 1.23 on the right. His post ablation venous duplex examination showed successful ablation of the right GS vein with thrombus formation 2 below the right saphenofemoral junction. The deep system was patent without evidence of thrombosis and this was done on 08/30/2016. 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 Addendum:  regarding his x-rays done today and x-ray of the left foot -- IMPRESSION: No objective evidence of osteomyelitis. There are soft tissue changes which  may reflect cellulitis. X-ray of the right foot -- IMPRESSION:Findings compatible with cellulitis of the toes. No objective evidence of osteomyelitis is observed. 10/01/16 on evaluation today patient's wounds appeared to be doing some better. I did review the x-rays as well which showed no evidence of osteomyelitis although there was evidence on x-ray of cellulitis. He fortunately is not having any discomfort although he continues to have some swelling. He does not remember being on any antibiotics recently. 10/15/16 on evaluation today patient's wounds overall appear to be doing better although he does have a new location noted on the left foot. Fortunately he is not having significant pain. It almost has the appearance that something is rubbing on the end of his toes but he wears the open toe shoes and according to what he is telling me never wears anything that would rub on his foot. There is no evidence of infection and specifically no evidence of a fungal infection 10/22/16 On evaluation today patient's wounds appeared to be doing better compared to last week in regard to his bilateral [name], [name] ([id number]) lower extremities. Fortunately I happy with how things are progressing although he still has ulcers I feel like that he is improving and appropriate manner. 11/12/16 on evaluation today patient appears to be doing well in regard to his bilateral feet and the respective wounds. We have been using surrounding her dressings along with an antifungal cream which seems to be doing very well. He has no bilateral dysfunction noticed that the rituals are weight loss at this point. He also has no nausea or vomiting a note purulent discharge. He did see Vein and vascular today and he tells me that they told him he could have surgery for his venous stasis  but they did not feel like it was worth it in his words. Fortunately patient's wounds do appear to be getting sneakily better. 11/26/2016 -- he says he is going to have some surgery during this week at [facility] for possibly a colon resection. 12/31/2016 -- the patient has been noncompliant with his smoking and I'm not sure whether he is also started drinking again. He continues to be very nonchalant about his care 01/14/2017 -- the patient's HandP has been reviewed well and I understand he is being compliant with trying to give up smoking and his local dressing changes. He does not have any surgical options of 4 to him by his vascular surgeons.he was last seen in early August by Dr. Hortencia Pilar who recommended compression stockings,and possibly lymph pumps in 2-3 months after doing a review ultrasound. 01/28/2017 - the patient did not have any fresh complaints but on examination I noted a large lacerated wound on the plantar aspect of his right fourth toe which had a lot of necrotic debris and it probes down to bone. 02/07/2017 -- x-ray of the right foot -- IMPRESSION: Soft tissue swelling about the first through fourth toes consistent with cellulitis. New destructive change in the tuft of the distal phalanx of the great toe is consistent with osteomyelitis. 02/14/2017 -- the patient's MRI is pending this coming Monday and he still continues to smoke. We have again gone over off loading of his wounds in great detail and he says he's been compliant. 02/21/2017 -- MR of the right foot -- IMPRESSION: 1. Soft tissue ulcer at the tip of the first, second and third toe knows. Cortical irregularity and bone marrow edema in the first distal phalanx most concerning for osteomyelitis. Mild marrow edema  in the second and third distal phalanx without definite cortical destruction which may reflect early osteomyelitis versus reactive marrow edema. 2. Soft tissue edema surrounding the first phalanx  most consistent with cellulitis. the patient was also recently evaluated by his medical oncologist Dr. 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 She is treating him with weekly Procrit. She is also keeping intra-abdominal lymphadenopathy and right lower lobe lung nodule under observation. 04/04/2017 -- he was seen by Dr. Adrian Prows on 03/25/2017 -- after review he empirically put him on ciprofloxacin and doxycycline as they have good bone penetration and good bioavailability and it will cover the usual pathogens and diabetic foot osteomyelitis. He will check inflammatory markers and plan a 60-38 week old records. C-reactive protein was 0.3 and the ESR was 72 04/18/17 on evaluation today patient appears to be doing about the same in regard to his lower extremity wounds bilaterally. He has continued to use the antifungal cream which does seem to be beneficial. Nonetheless the ulcers do seem to in some areas be epithelial eyes over and in other areas are still open. He is having no significant discomfort. 04/25/17-he is here in follow-up evaluation for multiple ulcerations to multiple toes bilaterally. He states he did see Dr. Ola Spurr again last week and continues antibiotic therapy. He is voicing no complaints or concerns, will continue with current treatment plan will possibility of adding compression therapy next week after an additional week of treatment/lotions to BLE prescribed by Dr Ola Spurr 05/02/17 he is here in follow up for for multiple ulcers to multiple toes bilaterally. we will stop using antifungal cream and will continue with silvercel and follow up next week 05/09/17-he is here in follow-up for multiple ulcerations to multiple toes bilaterally. There is improvement in appearance. He has not completely stopped using antifungal cream, but admits he has not using it between the toes. He has an appointment with  Dr. Ola Spurr on 2/11, continues on doxycycline and Cipro. It has been 5 weeks of antibiotic therapy, we will order plain film xray to evaluate for osteomyelitis next week, prior to follow up with ID. Will continue with silvercel and follow up next week 05/16/17-he is here in follow-up evaluation for multiple ulcerations to multiple toes bilaterally and new wound to the right posterior heel. There is essentially no change in appearance, deteriorating measurements; he has a history of waxing and waning measurements. He admits that he continues to apply moisturizer/cream/ointment to his toes despite weekly reminders to only apply silvercel to his toes. He states that he thinks the surgical shoe contributed to the superficial ulcer to his posterior [name], [name] ([id number]) heel, he is unable to articulate if this was an area of dry cracked skin as he has a similar area to the left heel. He now is wearing open toed slippers. He has an appointment with Dr. Ola Spurr on 2/11. We have ordered x-rays for her bilateral feet; he was advised to obtain the x-rays today or tomorrow. He will follow-up next week 05/23/17-he is here in follow-up evaluation for multiple ulcerations to multiple toes bilaterally and the right posterior heel. There is improvement in maceration. He has been compliant and not applying any moisturizing agent to his toes. He has been using Lac-Hydrin for his lower extremities with improvement. He did not go to his appointment on Monday with Dr. Ola Spurr secondary to financial concerns. X-rays for her bilateral feet showed: LEFT FOOT with slight  erosion of the tuft of the distal phalanges of the left first and second toe suspicious for osteomyelitis, RIGHT FOOT with 1.erosion of the tufts of the distal phalanges of the right first second and possibly third toes consistent with osteomyelitis, 2 no definitive abnormality of the calcaneus is seen on the images obtained, 3. Plantar calcaneal  degenerative spur. We briefly discussed hyperbaric adjunctive therapy for treatment of chronic refractory osteomyelitis. I do not find an a1c in EMR, will contact PCP for record, or order if needed. He has been encouraged to contact Dr Ola Spurr office regarding the follow-up appointment, encouraged him to inquire about payment plan. We will continue with same treatment plan and follow-up next week. He states he is still taking antibiotics and has "a lot" left. He states he has been taking them as directed, 2 pills twice daily. According to Dr. Blane Ohara office notes he was originally started on 12/17 for 4 weeks and extended on 1/14 for an additional 4 weeks. He should be done with his antibiotic therapy, he was advised to bring his bottles and to his next appointment, we will contact pharmacy. 05/30/17-he is here in follow-up evaluation for multiple ulcerations to multiple toes bilaterally and the right posterior heel. He is accompanied by his brother-in-law. Wounds are stable. He has yet to make up with Dr. Ola Spurr. We contacted his PCP, with no record of recent A1c we will draw an A1c. His brother-in-law states that he was taken off all of his diabetic medication secondary to kidney function. 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 The antibiotics were counted and have approximately 2 weeks left, although they should be complete. The pill organizer reveals missing days. We discussed the need for consistent medications, to have optimal benefit of medication. He has a cousin that lives with him and he will ask her to check his organizer daily. He has been advised to follow up with Dr Ola Spurr, and will go by the office today. He has been advised to quit smoking. 06/06/17-he is here in follow up evaluation. He has had to make an appointment with Dr. Ola Spurr. He did have blood  work obtained, a1c 5. He continues to take antibiotic therapy. Significant improvement in bilateral lower extremity edema with compression therapy. Essentially no change in ulcerations to toes. He states he is "going to try something different" and "let me know next week" if it works; he would not provide any additional information and was encouraged to follow our orders. We will follow up next week 06/13/17-he is here in follow-up evaluation. He has an appointment with Dr. Ola Spurr tomorrow morning. He states he purchased an ointment from [facility] and applied to his toes for 3 days, he does not remember the name of the ointment. There is improvement to his wounds, minimal maceration. He continues to take antibiotic therapy, this should have been completed last month. His brother-in-law who regularly accompanies his appointments was asked to take the bottles to the appointment tomorrow with Dr. Ola Spurr so he is aware. We will continue with 3 layer compression, and order OPEN TOE compression 20-30mmHg; we will apply compression stockings next week. He continues to smoke, smoked "2 cigarettes" last week 06/20/17 on evaluation today patient did receive his compression stockings which he has with him today for both lower extremities. With that being said he tells me at this point in time that he is very happy to have these he  really is not a big fan of the compression wraps that we have been utilizing although they have been of great benefit for him. Nonetheless at this point he does want to switch to the compression stockings. In my opinion as long as he is continuing with compression I'm okay with the stockings or the wraps. 06/27/17-he is here in follow-up evaluation for multiple ulcerations to his bilateral toes. There is some improvement in appearance. He is compliant in wearing his compression stockings with significant improvement in lower extremity edema. He saw Dr Ola Spurr on 3/8, per his  notes they would redraw ESR and CRP; plan to continue antibiotic therapy if these remain elevated. I do not see an ESR or CRP level in Epic. The patient continues to take antibiotics. 07/11/17-He is here in follow-up evaluation for multiple ulcerations to multiple toes bilaterally. He presents with complete epithelialization to the right third toe; there has been no deterioration. He continues on antibiotic therapy. He will follow-up next week 07/18/17-He is here in follow-up evaluation for multiple ulcerations to multiple toes bilaterally. He continues to make improvement. He continues on antibiotic therapy. He states he has been using something additional to our orders, he does not elaborate but states he will bring it in next week. 07/25/17-He is here in follow up evaluation for multiple ulcerations to bilateral toes. He is stable. He has completed antibiotic therapy. He admits to "filing" his toes after showers each evening, this is what he was referencing last week; he does not filing for the wounds. We will switch to [facility] and monitor for any improvement, he will follow-up next week 08/01/17-He is here in follow-up evaluation. He admits to "picking" at his toes after cleansing yesterday, leading to new areas of tissue loss on the bilateral second toe. There is improvement noted to the bilateral great toe. We will dress toes today and hope that they maintain until Monday where he will come in for a nurse visit. He has been advised, multiple times with expressed verbalization, to change the dressings to silvercel if the dressings get wet prior to [facility] appointment. ALYSSA, MANCERA ([id number]) 08/08/17-He is here in follow-up evaluation for bilateral first and second toe ulcerations. There is significant improvement to all ulcerations since last visit. We will switch to [facility] to all wounds and he will continue with nurse visits on a Monday/Thursday schedule and follow-up with me in  2 weeks. He continues to smoke, 1-3 cigarettes per day, and has been encouraged to not smoke until his next follow-up in 2 weeks. 08/15/17 on evaluation today patient's ulcers on his toes actually appear to be doing fairly well the right to ulcers may be a little bit more moist compared to the left I'm not really sure exactly why as the openings appear to be very small and I'm not seeing any evidence of anything significant as far as you lightest or otherwise. Nonetheless this may just be a small setback he's been doing very well with the [facility] Dressing 08/22/17-He is here in follow-up evaluation for ulcerations to his bilateral first and second toes, there is small/scant amount of drainage noted on today's dressing. He continues with Hydrofera Blue. He continues to smoke, 1 cigarette a day. Voices no complaint or concerns, compliant and compression therapy today. He'll follow-up next week 08/29/17-He is here in follow-up evaluation for ulcerations to his bilateral first and second toes, with a new wound to the left third toe. He states he cut himself while cutting  the toenail. He presents today with more maceration and increased measurements; waxing and waning measurements and moisture has been an ongoing issue. He continues to smoke a proximally 1 cigarette per day, but states he has not smoked since Sunday. We will initiate medihoney daily and evaluate next week. He presents today without compression stockings 09/05/17-He is here in follow-up evaluation for ulcerations to bilateral first and second and left third toe. He did not pick up the medihoney and therefore has not been changing his dressing. He admits to inconsistent where of compression stockings, admits to pain to his bilateral lower extremities with unilateral edema (right greater than left). The ulcerations to multiple toes are more macerated and larger in size than last week. He is wearing compression therapy today. He admits to  an overall feeling of weakness and fatigue and has been encouraged to contact his PCP for evaluation; he has a known history of anemia and intraabdominal lymphadenopathy. We will return to silvercel and he will follow up next week 09/19/17-He is here in follow-up evaluation for ulcerations to bilateral first and second toe.he was unable to make last week's appointment. The culture that was obtained on 5/30 grew clindamycin sensitive MRSA; he was initiated on 6/4 but did not start it until 6/7. There is significant improvement in all wounds, healing to left second and third toe. He also admits to having no alcohol or smoking for the last 6 days. We will continue with same treatment plan I will extend the clindamycin for an additional 10 days and he will follow-up next week. 09/26/17- He is here in follow evaluation for multiple ulcerations to multiple toes bilaterally. He continues to be non-compliant with dressing, compression therapy. He states he is taking the antibiotics as prescribed, although his recall is inconsistent. There is noted deterioration in all ulcers. We will continue same treatment plan, antibiotic therapy and he will follow up next week. 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 This information is obtained from the medical record, as he cannot articulate specifics to his diagnosis or treatment plan. 10/17/17-He is here in follow-up evaluation for multiple ulcerations to multiple toes bilaterally. There is chronic waxing and waning improvement/deterioration. On today's exam they appear improved, dry without maceration. He continues to apply a variety of topical agents, stating that he has switched between the blue product and silver product. In my opinion, this will be a chronic waxing and waning giving his  intermittent compliance. He will follow-up in 2 weeks. As it relates to the biopsy taken from his buttock, he has not followed up with that and has not started radiation therapy. 10/31/17-He is here for evaluation for multiple ulcerations to multiple toes bilaterally. Continues with waxing and waning improvement versus deterioration. They are stable on today's exam without significant maceration. 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 We will continue with every 2 week follow-up appointments 11/14/17- He is seen in follow-up violation for multiple ulcerations to multiple bilateral toes. He is scheduled for excision of basal cell carcinoma to his right buttock on Monday 8/12. He continues to have chronic waxing and waning of his ulcers; today is a stable day with minimal drainage and left ear is better than right toes. I will recount to infectious disease regarding chronic suppressive therapy. He admits to being  more compliant with compression stockings, although he is not wearing any today. He has been encouraged to continue with dressing changes as ordered and compression therapy daily. He will be seen in 2 weeks Electronic Signature(s) Signed: 11/14/2017 8:57:54 AM By: Lawanda Cousins Entered By: Lawanda Cousins on 11/14/2017 08:57:53 [name], [name] ([id number]) -------------------------------------------------------------------------------- Physician Orders Details Patient Name: [name], [name] Date of Service: 11/14/2017 8:30 AM Medical Record Number: [id number] Patient Account Number: [id number] Date of Birth/Sex: Jan 18, 1951 (67 y.o. M) Treating RN: Ahmed Prima Primary Care Provider: Lamonte Sakai Other Clinician: Referring Provider: Lamonte Sakai Treating Provider/Extender: Cathie Olden in Treatment: 15 Verbal / Phone Orders: Yes Clinician: Carolyne Fiscal, Debi Read Back and Verified: Yes Diagnosis Coding Wound Cleansing o Clean wound with Normal  Saline. Wound #14 Left Toe Second o Clean wound with Normal Saline. Wound #2 Right Toe Second o Clean wound with Normal Saline. Wound #5 Left Toe Great o Clean wound with Normal Saline. Anesthetic (add to Medication List) Wound #1 Right Toe Great o Topical Lidocaine 4% cream applied to wound bed prior to debridement (In Clinic Only). Wound #14 Left Toe Second o Topical Lidocaine 4% cream applied to wound bed prior to debridement (In Clinic Only). Wound #2 Right Toe Second o Topical Lidocaine 4% cream applied to wound bed prior to debridement (In Clinic Only). Wound #5 Left Toe Great o Topical Lidocaine 4% cream applied to wound bed prior to debridement (In Clinic Only). Primary Wound Dressing Wound #1 Right Toe Great o Silver Alginate Wound #14 Left Toe Second o Silver Alginate Wound #2 Right Toe Second o Silver Alginate Wound #5 Left Toe Great o Silver Alginate Secondary Dressing Wound #1 Right Toe Great o ABD pad o Dry Gauze o Conform/Kerlix Wound #14 Left Toe Second [name], [name] ([id number]) o ABD pad o Dry Gauze o Conform/Kerlix Wound #2 Right Toe Second o ABD pad o Dry Gauze o Conform/Kerlix Wound #5 Left Toe Great o ABD pad o Dry Gauze o Conform/Kerlix Dressing Change Frequency Wound #1 Right Toe Great o Change dressing every day. Wound #14 Left Toe Second o Change dressing every day. Wound #2 Right Toe Second o Change dressing every day. Wound #5 Left Toe Great o Change dressing every day. Follow-up Appointments Wound #1 Right Toe Great o Return Appointment in 2 weeks. Wound #14 Left Toe Second o Return Appointment in 2 weeks. Wound #2 Right Toe Second o Return Appointment in 2 weeks. Wound #5 Left Toe Great o Return Appointment in 2 weeks. Edema Control Wound #1 Right Toe Great o Patient to wear own compression stockings Wound #14 Left Toe Second o Patient to wear own  compression stockings Wound #2 Right Toe Second o Patient to wear own compression stockings Wound #5 Left Toe Great o Patient to wear own compression stockings Additional Orders / Instructions Wound #14 Left Toe Second o Stop Smoking Clemons, Karas [name]. ([id number]) o Increase protein intake. Patient Medications Allergies: No Known Drug Allergies Notifications Medication Indication Start End lidocaine DOSE 1 - topical 4 % cream - 1 cream topical ammonium lactate 11/14/2017 DOSE topical 12 % lotion - lotion topical Electronic Signature(s) Signed: 11/14/2017 8:48:19 AM By: Lawanda Cousins Entered By: Lawanda Cousins on 11/14/2017 08:48:18 [name], [name] ([id number]) -------------------------------------------------------------------------------- Prescription 11/14/2017 Patient Name: Merrily Brittle [name]. Provider: Lawanda Cousins NP Date of Birth: [birth year] NPI#: [id number] Sex: Jerilynn Mages DEA#: [id number] Phone #: [phone] License #: Patient Address: [facility] [place],  [place] [address] [address] [place], [place] [address] [phone] Allergies No Known Drug Allergies Medication Medication: Route: Strength: Form: lidocaine 4 % topical cream topical 4% cream Class: TOPICAL LOCAL ANESTHETICS Dose: Frequency / Time: Indication: 1 1 cream topical Number of Refills: Number of Units: 0 Generic Substitution: Start Date: End Date: One Time Use: Substitution Permitted No Note to Pharmacy: Signature(s): Date(s): Electronic Signature(s) Signed: 11/14/2017 5:08:35 PM By: Lawanda Cousins Entered By: Lawanda Cousins on 11/14/2017 08:48:27 [name], [name] ([id number]) --------------------------------------------------------------------------------  Problem List Details Patient Name: Dobbs, Braylin [name]. Date of Service: 11/14/2017 8:30 AM Medical Record Number: [id number] Patient Account Number:  [id number] Date of Birth/Sex: 11/12/1950 (67 y.o. M) Treating RN: Ahmed Prima Primary Care Provider: Lamonte Sakai Other Clinician: Referring Provider: Lamonte Sakai Treating Provider/Extender: Cathie Olden in Treatment: 68 Active Problems ICD-10 Evaluated Encounter Code Description Active Date Today Diagnosis L97.521 Non-pressure chronic ulcer of other part of left foot limited to 09/21/2016 No Yes breakdown of skin L97.511 Non-pressure chronic ulcer of other part of right foot limited to 09/21/2016 No Yes breakdown of skin E11.621 Type 2 diabetes mellitus with foot ulcer 09/21/2016 No Yes I87.323 Chronic venous hypertension (idiopathic) with inflammation of 09/21/2016 No Yes bilateral lower extremity I89.0 Lymphedema, not elsewhere classified 09/21/2016 No Yes F17.218 Nicotine dependence, cigarettes, with other nicotine-induced 09/21/2016 No Yes disorders F10.19 Alcohol abuse with unspecified alcohol-induced disorder 09/21/2016 No Yes M86.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, right ankle and foot 02/21/2017 No Yes Inactive Problems Resolved Problems Electronic Signature(s) OSHA, ERRICO ([id number]) Signed: 11/14/2017 8:55:01 AM By: Lawanda Cousins Entered By: Lawanda Cousins on 11/14/2017 08:55:00 [name], [name] ([id number]) -------------------------------------------------------------------------------- Progress Note Details Patient Name: Urbanowicz, Darrion [name]. Date of Service: 11/14/2017 8:30 AM Medical Record Number: [id number] Patient Account Number: [id number] Date of Birth/Sex: 09-03-1950 (67 y.o. M) Treating RN: Ahmed Prima Primary Care Provider: Lamonte Sakai Other Clinician: Referring Provider: Lamonte Sakai Treating Provider/Extender: Cathie Olden in Treatment: 43 Subjective Chief Complaint Information obtained from Patient Patient presents for treatment of an open diabetic ulcer to both feet History of Present Illness (HPI) The following HPI elements were  documented for the patient's wound: Location: bilateral feet ulceration on the toes Quality: Patient reports experiencing a dull pain to affected area(s). Severity: Patient states wound are getting better Duration: Patient has had the wound for > 3 months prior to seeking treatment at the wound center Timing: Pain in wound is constant (hurts all the time) Context: The wound would happen gradually Modifying Factors: Other treatment(s) tried include:treatment for lymphedema and is seen by the podiatrist Dr. Caryl Comes Associated Signs and Symptoms: Patient reports having increase swelling. 67 year old patient here to see as for bilateral feet ulceration to on his left first and second toe and 2 on his right first and second toe, which she's had for about 4 months. 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 Donella Stade. The patient is also undergoing workup by medical oncology for a intra-abdominal lymphadenopathy. 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 He is also status post appendectomy, inguinal lymph node biopsy, rectal biopsy and rectal examination under anesthesia. he currently smokes cigarettes about half packet a day. In March of this year he was seen by Dr. Hortencia Pilar, for evaluation of bilateral varicose veins  and besides wearing compression stockings he had recommended laser ablation of the right and left great saphenous veins to eleviate the symptoms and complications of severe superficial venous reflux disease. He also recommended lymphedema pumps for better control of his lymphedema. The patient recently has had on 08/23/2016, right greater saphenous vein ablation with the laser energy Earlier lower extremity venous reflux examination  done on 05/08/2016 showed no DVT or SVT both lower legs but incompetence of bilateral great saphenous veins was present. A lower arterial study was also done and there was no significant right lower and left lower extremity problems based on a normal toe brachial index bilaterally and the ABI was 1.21 the left and 1.23 on the right. His post ablation venous duplex examination showed successful ablation of the right GS vein with thrombus formation 2 below the right saphenofemoral junction. The deep system was patent without evidence of thrombosis and this was done on 08/30/2016. 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 Addendum: regarding his x-rays done today and x-ray of the left foot -- IMPRESSION: No objective evidence of osteomyelitis. There are soft tissue changes which may reflect cellulitis. X-ray of the right foot -- IMPRESSION:Findings compatible with cellulitis of the toes. No objective evidence of osteomyelitis is observed. 10/01/16 on evaluation today patient's wounds appeared to be doing some better. I did review the x-rays as well which showed no evidence of osteomyelitis although there was evidence on x-ray of cellulitis. He fortunately is not having any discomfort Corsi, [name] ([id number]) although he continues to have some swelling. He does not remember being on any antibiotics recently. 10/15/16 on evaluation today patient's wounds overall appear to be doing better although he does have a new location noted on the left foot. Fortunately he is not having significant pain. It almost has the appearance that something is rubbing on the end of his toes but he wears the open toe shoes and according to what he is telling me never wears anything that would rub on his foot. There is no evidence of infection and specifically no evidence of a fungal infection 10/22/16 On  evaluation today patient's wounds appeared to be doing better compared to last week in regard to his bilateral lower extremities. Fortunately I happy with how things are progressing although he still has ulcers I feel like that he is improving and appropriate manner. 11/12/16 on evaluation today patient appears to be doing well in regard to his bilateral feet and the respective wounds. We have been using surrounding her dressings along with an antifungal cream which seems to be doing very well. He has no bilateral dysfunction noticed that the rituals are weight loss at this point. He also has no nausea or vomiting a note purulent discharge. He did see Vein and vascular today and he tells me that they told him he could have surgery for his venous stasis but they did not feel like it was worth it in his words. Fortunately patient's wounds do appear to be getting sneakily better. 11/26/2016 -- he says he is going to have some surgery during this week at [facility] for possibly a colon resection. 12/31/2016 -- the patient has been noncompliant with his smoking and I'm not sure whether he is also started drinking again. He continues to be very nonchalant about his care 01/14/2017 -- the patient's HandP has been reviewed well and I understand  he is being compliant with trying to give up smoking and his local dressing changes. He does not have any surgical options of 4 to him by his vascular surgeons.he was last seen in early August by Dr. Hortencia Pilar who recommended compression stockings,and possibly lymph pumps in 2-3 months after doing a review ultrasound. 01/28/2017 - the patient did not have any fresh complaints but on examination I noted a large lacerated wound on the plantar aspect of his right fourth toe which had a lot of necrotic debris and it probes down to bone. 02/07/2017 -- x-ray of the right foot -- IMPRESSION: Soft tissue swelling about the first through fourth toes consistent  with cellulitis. New destructive change in the tuft of the distal phalanx of the great toe is consistent with osteomyelitis. 02/14/2017 -- the patient's MRI is pending this coming Monday and he still continues to smoke. We have again gone over off loading of his wounds in great detail and he says he's been compliant. 02/21/2017 -- MR of the right foot -- IMPRESSION: 1. Soft tissue ulcer at the tip of the first, second and third toe knows. Cortical irregularity and bone marrow edema in the first distal phalanx most concerning for osteomyelitis. Mild marrow edema in the second and third distal phalanx without definite cortical destruction which may reflect early osteomyelitis versus reactive marrow edema. 2. Soft tissue edema surrounding the first phalanx most consistent with cellulitis. the patient was also recently evaluated by his medical oncologist Dr. 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 She is treating him with weekly Procrit. She is also keeping intra-abdominal lymphadenopathy and right lower lobe lung nodule under observation. 04/04/2017 -- he was seen by Dr. Adrian Prows on 03/25/2017 -- after review he empirically put him on ciprofloxacin and doxycycline as they have good bone penetration and good bioavailability and it will cover the usual pathogens and diabetic foot osteomyelitis. He will check inflammatory markers and plan a 60-64 week old records. C-reactive protein was 0.3 and the ESR was 72 04/18/17 on evaluation today patient appears to be doing about the same in regard to his lower extremity wounds bilaterally. He has continued to use the antifungal cream which does seem to be beneficial. Nonetheless the ulcers do seem to in some areas be epithelial eyes over and in other areas are still open. He is having no significant discomfort. 04/25/17-he is here in follow-up evaluation for multiple ulcerations to  multiple toes bilaterally. He states he did see Dr. Ola Spurr again last week and continues antibiotic therapy. He is voicing no complaints or concerns, will continue with current treatment plan will possibility of adding compression therapy next week after an additional week of treatment/lotions to BLE prescribed by Dr Ola Spurr 05/02/17 he is here in follow up for for multiple ulcers to multiple toes bilaterally. we will stop using antifungal cream and will continue with silvercel and follow up next week Iversen, Tiwan [name]. ([id number]) 05/09/17-he is here in follow-up for multiple ulcerations to multiple toes bilaterally. There is improvement in appearance. He has not completely stopped using antifungal cream, but admits he has not using it between the toes. He has an appointment with Dr. Ola Spurr on 2/11, continues on doxycycline and Cipro. It has been 5 weeks of antibiotic therapy, we will order plain film xray to evaluate for osteomyelitis next week, prior to follow up with ID. Will continue with silvercel and follow up next week 05/16/17-he  is here in follow-up evaluation for multiple ulcerations to multiple toes bilaterally and new wound to the right posterior heel. There is essentially no change in appearance, deteriorating measurements; he has a history of waxing and waning measurements. He admits that he continues to apply moisturizer/cream/ointment to his toes despite weekly reminders to only apply silvercel to his toes. He states that he thinks the surgical shoe contributed to the superficial ulcer to his posterior heel, he is unable to articulate if this was an area of dry cracked skin as he has a similar area to the left heel. He now is wearing open toed slippers. He has an appointment with Dr. Ola Spurr on 2/11. We have ordered x-rays for her bilateral feet; he was advised to obtain the x-rays today or tomorrow. He will follow-up next week 05/23/17-he is here in follow-up evaluation for  multiple ulcerations to multiple toes bilaterally and the right posterior heel. There is improvement in maceration. He has been compliant and not applying any moisturizing agent to his toes. He has been using Lac-Hydrin for his lower extremities with improvement. He did not go to his appointment on Monday with Dr. Ola Spurr secondary to financial concerns. X-rays for her bilateral feet showed: LEFT FOOT with slight erosion of the tuft of the distal phalanges of the left first and second toe suspicious for osteomyelitis, RIGHT FOOT with 1.erosion of the tufts of the distal phalanges of the right first second and possibly third toes consistent with osteomyelitis, 2 no definitive abnormality of the calcaneus is seen on the images obtained, 3. Plantar calcaneal degenerative spur. We briefly discussed hyperbaric adjunctive therapy for treatment of chronic refractory osteomyelitis. I do not find an a1c in EMR, will contact PCP for record, or order if needed. He has been encouraged to contact Dr Ola Spurr office regarding the follow-up appointment, encouraged him to inquire about payment plan. We will continue with same treatment plan and follow-up next week. He states he is still taking antibiotics and has "a lot" left. He states he has been taking them as directed, 2 pills twice daily. According to Dr. Blane Ohara office notes he was originally started on 12/17 for 4 weeks and extended on 1/14 for an additional 4 weeks. He should be done with his antibiotic therapy, he was advised to bring his bottles and to his next appointment, we will contact pharmacy. 05/30/17-he is here in follow-up evaluation for multiple ulcerations to multiple toes bilaterally and the right posterior heel. He is accompanied by his brother-in-law. Wounds are stable. He has yet to make up with Dr. Ola Spurr. We contacted his PCP, with no record of recent A1c we will draw an A1c. His brother-in-law states that he was taken off all  of his diabetic medication secondary to kidney function. 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 The antibiotics were counted and have approximately 2 weeks left, although they should be complete. The pill organizer reveals missing days. We discussed the need for consistent medications, to have optimal benefit of medication. He has a cousin that lives with him and he will ask her to check his organizer daily. He has been advised to follow up with Dr Ola Spurr, and will go by the office today. He has been advised to quit smoking. 06/06/17-he is here in follow up evaluation. He has had to make an appointment with Dr. Ola Spurr. He did have blood work obtained, a1c 5. He continues to take  antibiotic therapy. Significant improvement in bilateral lower extremity edema with compression therapy. Essentially no change in ulcerations to toes. He states he is "going to try something different" and "let me know next week" if it works; he would not provide any additional information and was encouraged to follow our orders. We will follow up next week 06/13/17-he is here in follow-up evaluation. He has an appointment with Dr. Ola Spurr tomorrow morning. He states he purchased an ointment from [facility] and applied to his toes for 3 days, he does not remember the name of the ointment. There is improvement to his wounds, minimal maceration. He continues to take antibiotic therapy, this should have been completed last month. His brother-in-law who regularly accompanies his appointments was asked to take the bottles to the appointment tomorrow with Dr. Ola Spurr so he is aware. We will continue with 3 layer compression, and order OPEN TOE compression 20-30mmHg; we will apply compression stockings next week. He continues to smoke, smoked "2 cigarettes" last week 06/20/17 on evaluation today patient did receive his  compression stockings which he has with him today for both lower extremities. With that being said he tells me at this point in time that he is very happy to have these he really is not a big fan of the compression wraps that we have been utilizing although they have been of great benefit for him. Nonetheless at this point he does want to switch to the compression stockings. In my opinion as long as he is continuing with compression I'm okay with the stockings or the wraps. 06/27/17-he is here in follow-up evaluation for multiple ulcerations to his bilateral toes. There is some improvement in appearance. He is compliant in wearing his compression stockings with significant improvement in lower extremity edema. He saw Dr Ola Spurr on 3/8, per his notes they would redraw ESR and CRP; plan to continue antibiotic therapy if these remain elevated. I do not see an ESR or CRP level in Epic. The patient continues to take antibiotics. 07/11/17-He is here in follow-up evaluation for multiple ulcerations to multiple toes bilaterally. He presents with complete epithelialization to the right third toe; there has been no deterioration. He continues on antibiotic therapy. He will follow-up next week 07/18/17-He is here in follow-up evaluation for multiple ulcerations to multiple toes bilaterally. He continues to make improvement. He continues on antibiotic therapy. He states he has been using something additional to our orders, he does Sumlin, Macky [name]. ([id number]) not elaborate but states he will bring it in next week. 07/25/17-He is here in follow up evaluation for multiple ulcerations to bilateral toes. He is stable. He has completed antibiotic therapy. He admits to "filing" his toes after showers each evening, this is what he was referencing last week; he does not filing for the wounds. We will switch to [facility] and monitor for any improvement, he will follow-up next week 08/01/17-He is here in follow-up  evaluation. He admits to "picking" at his toes after cleansing yesterday, leading to new areas of tissue loss on the bilateral second toe. There is improvement noted to the bilateral great toe. We will dress toes today and hope that they maintain until Monday where he will come in for a nurse visit. He has been advised, multiple times with expressed verbalization, to change the dressings to silvercel if the dressings get wet prior to [facility] appointment. 08/08/17-He is here in follow-up evaluation for bilateral first and second toe ulcerations. There is significant improvement to all ulcerations since last  visit. We will switch to [facility] to all wounds and he will continue with nurse visits on a Monday/Thursday schedule and follow-up with me in 2 weeks. He continues to smoke, 1-3 cigarettes per day, and has been encouraged to not smoke until his next follow-up in 2 weeks. 08/15/17 on evaluation today patient's ulcers on his toes actually appear to be doing fairly well the right to ulcers may be a little bit more moist compared to the left I'm not really sure exactly why as the openings appear to be very small and I'm not seeing any evidence of anything significant as far as you lightest or otherwise. Nonetheless this may just be a small setback he's been doing very well with the [facility] Dressing 08/22/17-He is here in follow-up evaluation for ulcerations to his bilateral first and second toes, there is small/scant amount of drainage noted on today's dressing. He continues with Hydrofera Blue. He continues to smoke, 1 cigarette a day. Voices no complaint or concerns, compliant and compression therapy today. He'll follow-up next week 08/29/17-He is here in follow-up evaluation for ulcerations to his bilateral first and second toes, with a new wound to the left third toe. He states he cut himself while cutting the toenail. He presents today with more maceration and increased measurements;  waxing and waning measurements and moisture has been an ongoing issue. He continues to smoke a proximally 1 cigarette per day, but states he has not smoked since Sunday. We will initiate medihoney daily and evaluate next week. He presents today without compression stockings 09/05/17-He is here in follow-up evaluation for ulcerations to bilateral first and second and left third toe. He did not pick up the medihoney and therefore has not been changing his dressing. He admits to inconsistent where of compression stockings, admits to pain to his bilateral lower extremities with unilateral edema (right greater than left). The ulcerations to multiple toes are more macerated and larger in size than last week. He is wearing compression therapy today. He admits to an overall feeling of weakness and fatigue and has been encouraged to contact his PCP for evaluation; he has a known history of anemia and intraabdominal lymphadenopathy. We will return to silvercel and he will follow up next week 09/19/17-He is here in follow-up evaluation for ulcerations to bilateral first and second toe.he was unable to make last week's appointment. The culture that was obtained on 5/30 grew clindamycin sensitive MRSA; he was initiated on 6/4 but did not start it until 6/7. There is significant improvement in all wounds, healing to left second and third toe. He also admits to having no alcohol or smoking for the last 6 days. We will continue with same treatment plan I will extend the clindamycin for an additional 10 days and he will follow-up next week. 09/26/17- He is here in follow evaluation for multiple ulcerations to multiple toes bilaterally. He continues to be non-compliant with dressing, compression therapy. He states he is taking the antibiotics as prescribed, although his recall is inconsistent. There is noted deterioration in all ulcers. We will continue same treatment plan, antibiotic therapy and he will follow up  next week. 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 This information is obtained from the medical record, as he cannot articulate specifics to his diagnosis or treatment plan. 10/17/17-He is here  in follow-up evaluation for multiple ulcerations to multiple toes bilaterally. There is chronic waxing and waning improvement/deterioration. On today's exam they appear improved, dry without maceration. He continues to apply a variety of topical agents, stating that he has switched between the blue product and silver product. In my opinion, this will be a chronic waxing and waning giving his intermittent compliance. He will follow-up in 2 weeks. As it relates to the biopsy taken from his buttock, he has not followed up with that and has not started radiation therapy. 10/31/17-He is here for evaluation for multiple ulcerations to multiple toes bilaterally. Continues with waxing and waning improvement versus deterioration. They are stable on today's exam without significant maceration. 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 We will continue with every 2 week follow-up appointments 11/14/17- He is seen in follow-up violation for multiple ulcerations to multiple bilateral toes. He is scheduled for excision of basal cell carcinoma to his right buttock on Monday 8/12. He continues to have chronic waxing and waning of his ulcers; today is a stable day with minimal drainage and left ear is better than right toes. I will recount to infectious disease regarding chronic suppressive therapy. He admits to being more compliant with compression stockings, although he is not wearing any today. He has been encouraged to continue with dressing changes as ordered and compression therapy daily. He will be seen in  2 weeks Patient History Information obtained from Patient. GUMECINDO, HOPKIN ([id number]) Social History Current every day smoker, Marital Status - Widowed, Alcohol Use - Daily - quit drinking about a week ago, Drug Use - No History, Caffeine Use - Moderate. 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 Respiratory Denies complaints or symptoms of Shortness of Breath. Cardiovascular Complains or has symptoms of LE edema - chronic; not wearing compression stockings. Denies complaints or symptoms of Chest pain. Gastrointestinal Denies complaints or symptoms of Nausea, Vomiting. Objective Constitutional Vitals Time Taken: 8:30 AM, Height: 69 in, Weight: 168 lbs, BMI: 24.8, Temperature: 97.7 F, Pulse: 75 bpm, Respiratory Rate: 16 breaths/min, Blood Pressure: 158/81 mmHg. Integumentary (Hair, Skin) Wound #1 status is Open. Original cause of wound was Gradually Appeared. The wound is located on the Right Toe Great. The wound measures 0.6cm length x 0.6cm width x 0.1cm depth; 0.283cm^2 area and 0.028cm^3 volume. There is Fat Layer (Subcutaneous Tissue) Exposed exposed. There is no tunneling or undermining noted. There is a small amount of serous drainage noted. The wound margin is flat and intact. There is large (67-100%) pink granulation within the wound bed. There is no necrotic tissue within the wound bed. The periwound skin appearance did not exhibit: Callus, Crepitus, Excoriation, Induration, Rash, Scarring, Dry/Scaly, Maceration, Atrophie Blanche, Cyanosis, Ecchymosis, Hemosiderin Staining, Mottled, Pallor, Rubor, Erythema. Periwound temperature was noted as No Abnormality. Wound #14 status is Open. Original cause of wound was Gradually Appeared. The wound is located on the Left Toe Second. The wound  measures 0.3cm length x 0.3cm width x 0.1cm depth; 0.071cm^2 area and 0.007cm^3 volume. There is no tunneling or undermining noted. There is a medium amount of serous drainage noted. The wound margin is flat and intact. There is large (67-100%) pink granulation within the wound bed. There is no necrotic tissue within the wound bed. The periwound skin appearance did not exhibit: Callus, Crepitus, Excoriation, Induration, Rash,  Scarring, Dry/Scaly, Maceration, Atrophie Blanche, Cyanosis, Ecchymosis, Hemosiderin Staining, Mottled, Pallor, Rubor, Erythema. Periwound temperature was noted as No Abnormality. Wound #16 status is Open. Original cause of wound was Not Known. The wound is located on the [employer]. The wound measures 0.5cm length x 0.4cm width x 0.1cm depth; 0.157cm^2 area and 0.016cm^3 volume. There is Fat Layer (Subcutaneous Tissue) Exposed exposed. There is no tunneling or undermining noted. There is a medium amount of serous drainage noted. The wound margin is flat and intact. There is large (67-100%) pink granulation within the wound bed. There is no necrotic tissue within the wound bed. The periwound skin appearance did not exhibit: Callus, Crepitus, Excoriation, Induration, Rash, Scarring, Dry/Scaly, Maceration, Atrophie Blanche, Cyanosis, Ecchymosis, Hemosiderin Staining, Mottled, Pallor, Rubor, Erythema. Periwound temperature was noted as No Abnormality. [name], [name] ([id number]) Wound #2 status is Open. Original cause of wound was Gradually Appeared. The wound is located on the Right Toe Second. The wound measures 0.7cm length x 0.8cm width x 0.1cm depth; 0.44cm^2 area and 0.044cm^3 volume. There is no tunneling or undermining noted. There is a medium amount of serous drainage noted. The wound margin is flat and intact. There is large (67-100%) pink granulation within the wound bed. There is no necrotic tissue within the wound bed. The periwound skin appearance did  not exhibit: Callus, Crepitus, Excoriation, Induration, Rash, Scarring, Dry/Scaly, Maceration, Atrophie Blanche, Cyanosis, Ecchymosis, Hemosiderin Staining, Mottled, Pallor, Rubor, Erythema. Periwound temperature was noted as No Abnormality. Wound #5 status is Open. Original cause of wound was Gradually Appeared. The wound is located on the Left Toe Great. The wound measures 0.3cm length x 0.2cm width x 0.1cm depth; 0.047cm^2 area and 0.005cm^3 volume. There is Fat Layer (Subcutaneous Tissue) Exposed exposed. There is no tunneling or undermining noted. There is a medium amount of serous drainage noted. The wound margin is flat and intact. There is large (67-100%) pink granulation within the wound bed. There is no necrotic tissue within the wound bed. The periwound skin appearance did not exhibit: Callus, Crepitus, Excoriation, Induration, Rash, Scarring, Dry/Scaly, Maceration, Atrophie Blanche, Cyanosis, Ecchymosis, Hemosiderin Staining, Mottled, Pallor, Rubor, Erythema. Periwound temperature was noted as No Abnormality. 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 Wound #14 Left Toe Second: Clean wound with Normal Saline. Wound #2 Right Toe Second: Clean wound with Normal Saline. Wound #5 Left Toe Great: Clean wound with Normal Saline. Anesthetic (add to Medication List): Wound #1 Right Toe Great: Topical Lidocaine 4% cream applied to  wound bed prior to debridement (In Clinic Only). Wound #14 Left Toe Second: Topical Lidocaine 4% cream applied to wound bed prior to debridement (In Clinic Only). Wound #2 Right Toe Second: Topical Lidocaine 4% cream applied to wound bed prior to debridement (In Clinic Only). Wound #5 Left Toe Great: Topical Lidocaine 4% cream applied to wound bed prior to debridement (In Clinic Only). SHREYAN, HINZ ([id number]) Primary Wound Dressing: Wound #1 Right Toe Great: Silver Alginate Wound #14 Left Toe Second: Silver Alginate Wound #2 Right Toe Second: Silver Alginate Wound #5 Left Toe Great:  Silver Alginate Secondary Dressing: Wound #1 Right Toe Great: ABD pad Dry Gauze Conform/Kerlix Wound #14 Left Toe Second: ABD pad Dry Gauze Conform/Kerlix Wound #2 Right Toe Second: ABD pad Dry Gauze Conform/Kerlix Wound #5 Left Toe Great: ABD pad Dry Gauze Conform/Kerlix Dressing Change Frequency: Wound #1 Right Toe Great: Change dressing every day. Wound #14 Left Toe Second: Change dressing every day. Wound #2 Right Toe Second: Change dressing every day. Wound #5 Left Toe Great: Change dressing every day. Follow-up Appointments: Wound #1 Right Toe Great: Return Appointment in 2 weeks. Wound #14 Left Toe Second: Return Appointment in 2 weeks. Wound #2 Right Toe Second: Return Appointment in 2 weeks. Wound #5 Left Toe Great: Return Appointment in 2 weeks. Edema Control: Wound #1 Right Toe Great: Patient to wear own compression stockings Wound #14 Left Toe Second: Patient to wear own compression stockings Wound #2 Right Toe Second: Patient to wear own compression stockings Wound #5 Left Toe Great: Patient to wear own compression stockings Additional Orders / Instructions: Wound #14 Left Toe Second: Stop Smoking Increase protein intake. The following medication(s) was prescribed: lidocaine topical 4 % cream 1 1 cream topical was prescribed at facility ammonium lactate  topical 12 % lotion lotion topical starting 11/14/2017 AFNAN, EMBERTON ([id number]) Electronic Signature(s) Signed: 11/14/2017 8:58:58 AM By: Lawanda Cousins Entered By: Lawanda Cousins on 11/14/2017 08:58:58 Deardorff, [name] ([id number]) -------------------------------------------------------------------------------- ROS/PFSH Details Patient Name: Duddy, Solmon [name]. Date of Service: 11/14/2017 8:30 AM Medical Record Number: [id number] Patient Account Number: [id number] Date of Birth/Sex: 1950/05/22 (67 y.o. M) Treating RN: Ahmed Prima Primary Care Provider: Lamonte Sakai Other Clinician: Referring Provider: Lamonte Sakai Treating Provider/Extender: Cathie Olden in Treatment: 44 Information Obtained From Patient Wound History Do you currently have one or more open woundso Yes How many open wounds do you currently haveo 6 Approximately how long have you had your woundso 3 months How have you been treating your wound(s) until nowo ointment and bandage Has your wound(s) ever healed and then re-openedo No Have you had any lab work done in the past montho No Have you tested positive for an antibiotic resistant organism (MRSA, VRE)o No Have you tested positive for osteomyelitis (bone infection)o No Have you had any tests for circulation on your legso Yes Who ordered the testo PCP Where was the test doneo [facility] Constitutional Symptoms (General Health) Complaints and Symptoms: Negative for: Fatigue; Fever; Chills Respiratory Complaints and Symptoms: Negative for: Shortness of Breath Medical History: Negative for: Aspiration; Asthma; Chronic Obstructive Pulmonary Disease (COPD); Pneumothorax; Sleep Apnea; Tuberculosis Cardiovascular Complaints and Symptoms: Positive for: LE edema - chronic; not wearing compression stockings Negative for: Chest pain Medical History: Positive for: Congestive Heart Failure; Hypertension; Peripheral Venous Disease Negative for: Angina; Arrhythmia; Coronary  Artery Disease; Deep Vein Thrombosis; Hypotension; Myocardial Infarction; Peripheral Arterial Disease; Phlebitis; Vasculitis Gastrointestinal Complaints and Symptoms: Negative for: Nausea; Vomiting Medical History: Negative for: Cirrhosis ; Colitis; Crohnos; Hepatitis A; Hepatitis B; Hepatitis C Eyes Armentrout, Lundy [name]. ([id number]) Medical History: Negative for: Cataracts; Glaucoma; Optic Neuritis Ear/Nose/Mouth/Throat Medical History: Negative for: Chronic sinus problems/congestion; Middle ear problems Hematologic/Lymphatic Medical History: Positive for: Anemia; Lymphedema Negative for: Hemophilia; Human Immunodeficiency Virus; Sickle Cell Disease Endocrine Medical History: Positive for: Type II Diabetes Treated with: Oral agents Blood sugar tested every day: Yes Tested : QD Genitourinary Medical History: Negative for: End Stage Renal Disease Immunological Medical History: Negative for: Lupus Erythematosus; Raynaudos; Scleroderma Integumentary (Skin) Medical History: Negative for: History of Burn; History of pressure wounds Musculoskeletal  Medical History: Negative for: Gout; Rheumatoid Arthritis; Osteoarthritis; Osteomyelitis Neurologic Medical History: Positive for: Neuropathy Negative for: Dementia; Quadriplegia; Paraplegia; Seizure Disorder Oncologic Medical History: Past Medical History Notes: 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 Immunizations Pneumococcal Vaccine: Received Pneumococcal Vaccination: No Immunization Notes: up to date PASCO, MARCHITTO ([id number]) Implantable Devices Family and Social History Current every day smoker; Marital Status - Widowed; Alcohol Use: Daily - quit drinking about a week ago; Drug Use: No History; Caffeine Use: Moderate; Financial Concerns: No; Food, Clothing or Shelter Needs: No; Support System Lacking: No; Transportation Concerns: No; Advanced Directives: No; Patient does not want information on  Advanced Directives Physician Affirmation I have reviewed and agree with the above information. Electronic Signature(s) Signed: 11/14/2017 4:46:33 PM By: Alric Quan Signed: 11/14/2017 5:08:35 PM By: Lawanda Cousins Entered By: Lawanda Cousins on 11/14/2017 08:58:44 Gikas, [name] ([id number]) -------------------------------------------------------------------------------- [place] Details Patient Name: Lebarron, Rorey [name]. Date of Service: 11/14/2017 Medical Record Number: [id number] Patient Account Number: [id number] Date of Birth/Sex: 10-Sep-1950 (67 y.o. 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 I89.0 Lymphedema, not elsewhere classified F17.218 Nicotine dependence, cigarettes, 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 abuse 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, right ankle and foot Facility Procedures CPT4 Code: 52778242 Description: 35361 - WOUND CARE VISIT-LEV 5 EST PT Modifier: Quantity: 1 Physician Procedures CPT4 Code Description: 4431540 08676 - WC PHYS LEVEL 3 - EST PT ICD-10 Diagnosis Description L97.521 Non-pressure chronic ulcer of other part of left foot limited to L97.511 Non-pressure chronic ulcer of other part of right foot limited t I87.323 Chronic  venous hypertension (idiopathic) with inflammation of bi M86.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, right ankle and foot Modifier: breakdown of s o breakdown of  lateral lower [name] Quantity: 1 kin skin xtremity Electronic Signature(s) Signed: 11/14/2017 8:59:27 AM By: Lawanda Cousins Entered By: Lawanda Cousins on 11/14/2017 08:59:26

## 2017-11-26 ENCOUNTER — Ambulatory Visit (INDEPENDENT_AMBULATORY_CARE_PROVIDER_SITE_OTHER): Payer: Medicare HMO | Admitting: General Surgery

## 2017-11-26 ENCOUNTER — Encounter: Payer: Self-pay | Admitting: General Surgery

## 2017-11-26 VITALS — BP 160/72 | HR 86 | Resp 12 | Ht 67.0 in | Wt 184.0 lb

## 2017-11-26 DIAGNOSIS — K6289 Other specified diseases of anus and rectum: Secondary | ICD-10-CM

## 2017-11-26 NOTE — Progress Notes (Signed)
Patient ID: [name], male   DOB: 02/14/1951, 67 y.o.   MRN: [id number]  Chief Complaint  Patient presents with  . Follow-up    HPI [name] is a 67 y.o. male here today for his pre op anal lesion excision scheduled for 12/16/2017.  HPI  Past Medical History:  Diagnosis Date  . Anemia   . Cancer ([place]) 11/02/2014   PERIANAL MASS, RIGHT; INCISIONAL BIOPSY: INVASIVE CARCINOMA WITH BASALOID FEATURES  . CHF (congestive heart failure) ([place])   . Chronic kidney disease   . COPD (chronic obstructive pulmonary disease) ([place])    NO INHALERS  . Diabetes mellitus    NO MEDS  . GERD (gastroesophageal reflux disease)   . Hepatic cirrhosis ([place])   . Hypertension   . Leg swelling   . Loose, teeth    PATIENT STATES "HAS 6 TEETH, SOME ARE LOOSE"  . Neuromuscular disorder ([employer])    RIGHT HAND AND FINGERS NUMB  . Shortness of breath dyspnea    WITH EXERTION   . 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 APPENDECTOMY    . COLONOSCOPY WITH PROPOFOL N/A 11/19/2014   Procedure: COLONOSCOPY WITH PROPOFOL;  Surgeon: Lucilla Lame, MD;  Location: [facility];  Service: Endoscopy;  Laterality: N/A;  DIABETIC-ORAL MEDS  . COLONOSCOPY WITH PROPOFOL N/A 10/04/2016   Procedure: COLONOSCOPY WITH PROPOFOL;  Surgeon: Jonathon Bellows, MD;  Location[facility] ENDOSCOPY;  Service: Endoscopy;  Laterality: N/A;  . ESOPHAGOGASTRODUODENOSCOPY (EGD) WITH PROPOFOL N/A 10/04/2016   Procedure: ESOPHAGOGASTRODUODENOSCOPY (EGD) WITH PROPOFOL;  Surgeon: Jonathon Bellows, MD;  Location: [facility] ENDOSCOPY;  Service: Endoscopy;  Laterality: N/A;  . GIVENS CAPSULE STUDY N/A 11/13/2016   Procedure: GIVENS CAPSULE STUDY;  Surgeon: Jonathon Bellows, MD;  Location: [facility] ENDOSCOPY;  Service: Gastroenterology;  Laterality: N/A;  . INGUINAL LYMPH NODE BIOPSY Right 01/26/2016   Procedure: INGUINAL LYMPH NODE BIOPSY;  Surgeon: Clayburn Pert, MD;  Location: [facility] ORS;  Service:  General;  Laterality: Right;  . RECTAL BIOPSY N/A 11/02/2014   Procedure: BIOPSY RECTAL;  Surgeon: Marlyce Huge, MD;  Location: [facility] ORS;  Service: General;  Laterality: N/A;  . RECTAL EXAM UNDER ANESTHESIA N/A 11/02/2014   Procedure: RECTAL EXAM UNDER ANESTHESIA;  Surgeon: Marlyce Huge, MD;  Location: [facility] ORS;  Service: General;  Laterality: N/A;  . TONSILLECTOMY      Family History  Problem Relation Age of Onset  . Asthma Mother   . Colon cancer Neg Hx     Social History Social History   Tobacco Use  . Smoking status: Current Every Day Smoker    Packs/day: 0.50    Years: 20.00    Pack years: 10.00    Types: Cigarettes  . Smokeless tobacco: Never Used  . Tobacco comment: 1 PACK EVERY 2 DAYS  Substance Use Topics  . Alcohol use: Not Currently  . Drug use: Yes    Types: Marijuana    Comment: Last Use 2016 per patient-+ UDS FOR COCAINE IN 2014    No Known Allergies  Current Outpatient Medications  Medication Sig Dispense Refill  . amLODipine (NORVASC) 10 MG tablet Take 10 mg by mouth every morning.     Marland Kitchen atorvastatin (LIPITOR) 40 MG tablet Take 40 mg by mouth every morning.     . bismuth subsalicylate (PEPTO BISMOL) 262 MG/15ML suspension Take 30 mLs by mouth every 6 (six) hours as needed for indigestion or diarrhea or loose  stools.     . chlorthalidone (HYGROTON) 25 MG tablet Take 25 mg by mouth every morning.     . ferrous sulfate 325 (65 FE) MG tablet Take 325 mg by mouth daily.     . hydrALAZINE (APRESOLINE) 25 MG tablet Take 25 mg 2 (two) times daily by mouth.    Marland Kitchen omeprazole (PRILOSEC) 40 MG capsule Take 40 mg by mouth every morning.     . polyethylene glycol (MIRALAX / GLYCOLAX) packet Take 17 g by mouth daily as needed for mild constipation.     . vitamin B-12 (CYANOCOBALAMIN) 1000 MCG tablet Take 1 tablet (1,000 mcg total) by mouth daily. 30 tablet 3  . Vitamin D, Ergocalciferol, (DRISDOL) 50000 units CAPS capsule Take 50,000 Units by mouth every 7  (seven) days. On Thursdays     No current facility-administered medications for this visit.    Facility-Administered Medications Ordered in Other Visits  Medication Dose Route Frequency Provider Last Rate Last Dose  . Darbepoetin Alfa (ARANESP) injection 100 mcg  100 mcg Subcutaneous Q30 days Sindy Guadeloupe, MD   100 mcg at 07/25/17 0924  . Darbepoetin Alfa (ARANESP) injection 100 mcg  100 mcg Subcutaneous Q30 days Sindy Guadeloupe, MD   100 mcg at 08/22/17 1015  . epoetin alfa (EPOGEN,PROCRIT) injection 40,000 Units  40,000 Units Subcutaneous Weekly Sindy Guadeloupe, MD   40,000 Units at 05/23/17 1145    Review of Systems Review of Systems  Constitutional: Negative.   Respiratory: Negative.   Cardiovascular: Negative.     Blood pressure (!) 160/72, pulse 86, resp. rate 12, height 5\' 7"  (1.702 m), weight 184 lb (83.5 kg).  Physical Exam Physical Exam  Constitutional: He is oriented to person, place, and time. He appears well-developed and well-nourished.  Eyes: Conjunctivae are normal. No scleral icterus.  Neck: Normal range of motion.  Cardiovascular: Normal rate, regular rhythm and normal heart sounds.  Pulmonary/Chest: Effort normal and breath sounds normal.  Lymphadenopathy:    He has no cervical adenopathy.  Neurological: He is alert and oriented to person, place, and time.  Skin: Skin is warm and dry.    Data Reviewed The patient presented to the emergency department on August 11 with chest pain.  He received 2 units of packed cells with resolution.  During this visit he tested positive for cocaine, likely accounting for some of his atypical chest pain.  Previous biopsies of the recurrent gluteal mass was basaloid/basal cell carcinoma.  Assessment    Improvement of anemia post transfusion.  Basal cell carcinoma recurrent post radiation therapy.  Prior cocaine use.    Plan  Patient is scheduled for surgery on 12/16/2017 for gluteal mass excision likely rotation flap  coverage.  The patient is aware that he will undergo a another urine drug test the day of surgery and if positive surgery will be canceled.  He reports he has not used cocaine since his hospital admission.  The reasoning for surgery cancellation including acute coronary spasm and death during induction of anesthesia was reviewed.    As he had been the first case scheduled last time resulting in a significant disruption of the operating room schedule, he will be the last case of the day on December 16, 2017 to be sure he clears his drug screen.  HPI, Physical Exam, Assessment and Plan have been scribed under the direction and in the presence of Hervey Ard, MD.  Gaspar Cola, CMA  I have completed the exam and reviewed the  above documentation for accuracy and completeness.  I agree with the above.  Haematologist has been used and any errors in dictation or transcription are unintentional.  Hervey Ard, M.D., F.A.C.S.   Forest Gleason Nusayba Cadenas 11/26/2017, 8:56 PM

## 2017-11-26 NOTE — Patient Instructions (Signed)
Patient is scheduled for surgery on 12/16/2017 anal lesion excision.

## 2017-11-28 ENCOUNTER — Other Ambulatory Visit: Payer: Self-pay

## 2017-11-28 ENCOUNTER — Inpatient Hospital Stay: Payer: Medicare HMO

## 2017-11-28 ENCOUNTER — Encounter: Payer: Medicare HMO | Admitting: Nurse Practitioner

## 2017-11-28 DIAGNOSIS — E11621 Type 2 diabetes mellitus with foot ulcer: Secondary | ICD-10-CM | POA: Diagnosis not present

## 2017-11-28 DIAGNOSIS — D638 Anemia in other chronic diseases classified elsewhere: Secondary | ICD-10-CM

## 2017-11-28 DIAGNOSIS — C44529 Squamous cell carcinoma of skin of other part of trunk: Secondary | ICD-10-CM

## 2017-11-28 DIAGNOSIS — E538 Deficiency of other specified B group vitamins: Secondary | ICD-10-CM

## 2017-11-28 DIAGNOSIS — D649 Anemia, unspecified: Secondary | ICD-10-CM

## 2017-11-28 DIAGNOSIS — D509 Iron deficiency anemia, unspecified: Secondary | ICD-10-CM

## 2017-11-28 DIAGNOSIS — I130 Hypertensive heart and chronic kidney disease with heart failure and stage 1 through stage 4 chronic kidney disease, or unspecified chronic kidney disease: Secondary | ICD-10-CM | POA: Diagnosis not present

## 2017-11-28 LAB — HEMOGLOBIN: HEMOGLOBIN: 9.7 g/dL — AB (ref 13.0–18.0)

## 2017-11-28 LAB — SAMPLE TO BLOOD BANK

## 2017-11-28 NOTE — Progress Notes (Signed)
Attempted to locate pt to inform him of his lab results and no need for transfusion today, however, could not locate.

## 2017-11-28 NOTE — Progress Notes (Signed)
Per Lanetta Inch, RN no blood transfusion needed today d/t Hgb 9.7, pt informed.

## 2017-12-06 ENCOUNTER — Encounter
Admission: RE | Admit: 2017-12-06 | Discharge: 2017-12-06 | Disposition: A | Discharge status: Home / Self Care | Payer: Medicare HMO | Source: Ambulatory Visit | Attending: General Surgery | Admitting: General Surgery

## 2017-12-06 NOTE — Patient Instructions (Signed)
Your procedure is scheduled on: 12-16-17 Report to Same Day Surgery 2nd floor medical mall [facility] Entrance-take elevator on left to 2nd floor.  Check in with surgery information desk.) To find out your arrival time please call [phone] between 1PM - 3PM on 12-13-17  Remember: Instructions that are not followed completely may result in serious medical risk, up to and including death, or upon the discretion of your surgeon and anesthesiologist your surgery may need to be rescheduled.    _x___ 1. Do not eat food after midnight the night before your procedure. You may drink clear liquids up to 2 hours before you are scheduled to arrive at the hospital for your procedure.  Do not drink clear liquids within 2 hours of your scheduled arrival to the hospital.  Clear liquids include  --Water or Apple juice without pulp  --Clear carbohydrate beverage such as ClearFast or Gatorade  --Black Coffee or Clear Tea (No milk, no creamers, do not add anything to the coffee or Tea   ____Ensure clear carbohydrate drink on the way to the hospital for bariatric patients  ____Ensure clear carbohydrate drink 3 hours before surgery for Dr Dwyane Luo patients if physician instructed.   No gum chewing or hard candies.     __x__ 2. No Alcohol for 24 hours before or after surgery.   __x__3. No Smoking or e-cigarettes for 24 prior to surgery.  Do not use any chewable tobacco products for at least 6 hour prior to surgery   ____  4. Bring all medications with you on the day of surgery if instructed.    __x__ 5. Notify your doctor if there is any change in your medical condition     (cold, fever, infections).    x___6. On the morning of surgery brush your teeth with toothpaste and water.  You may rinse your mouth with mouth wash if you wish.  Do not swallow any toothpaste or mouthwash.   Do not wear jewelry, make-up, hairpins, clips or nail polish.  Do not wear lotions, powders, or perfumes. You may wear  deodorant.  Do not shave 48 hours prior to surgery. Men may shave face and neck.  Do not bring valuables to the hospital.    [facility] is not responsible for any belongings or valuables.               Contacts, dentures or bridgework may not be worn into surgery.  Leave your suitcase in the car. After surgery it may be brought to your room.  For patients admitted to the hospital, discharge time is determined by your treatment team.  _  Patients discharged the day of surgery will not be allowed to drive home.  You will need someone to drive you home and stay with you the night of your procedure.    Please read over the following fact sheets that you were given:   [facility] Preparing for Surgery and or MRSA Information   _x___ TAKE THE FOLLOWING MEDICATION THE MORNING OF SURGERY WITH A SMALL SIP OF WATER. These include:  1. AMLODIPINE  2. HYDRALAZINE  3. LIPITOR  4. PRILOSEC  5. TAKE AN EXTRA PRILOSEC BEFORE BED THE NIGHT BEFORE SURGERY  6.  ____Fleets enema or Magnesium Citrate as directed.   ____ Use CHG Soap or sage wipes as directed on instruction sheet   ____ Use inhalers on the day of surgery and bring to hospital day of surgery  ____ Stop Metformin and Janumet 2 days  prior to surgery.    ____ Take 1/2 of usual insulin dose the night before surgery and none on the morning surgery.   ____ Follow recommendations from Cardiologist, Pulmonologist or PCP regarding stopping Aspirin, Coumadin, Plavix ,Eliquis, Effient, or Pradaxa, and Pletal.  ____Stop Anti-inflammatories such as Advil, Aleve, Ibuprofen, Motrin, Naproxen, Naprosyn, Goodies powders or aspirin products. OK to take Tylenol    ____ Stop supplements until after surgery.     ____ Bring C-Pap to the hospital.

## 2017-12-07 NOTE — Progress Notes (Signed)
[name] ([id number]) Visit Report for 11/28/2017 Arrival Information Details Patient Name: [name], [name] Date of Service: 11/28/2017 9:00 AM Medical Record Number: [id number] Patient Account Number: [id number] Date of Birth/Sex: November 16, 1950 (67 y.o. M) Treating RN: Montey Hora Primary Care Saurav Crumble: Lamonte Sakai Other Clinician: Referring Ossie Yebra: Lamonte Sakai Treating Tola Meas/Extender: Cathie Olden in Treatment: 47 Visit Information History Since Last Visit Added or deleted any medications: No Patient Arrived: Ambulatory Any new allergies or adverse reactions: No Arrival Time: 09:13 Had a fall or experienced change in No Accompanied By: brother activities of daily living that may affect Transfer Assistance: None risk of falls: Patient Identification Verified: Yes Signs or symptoms of abuse/neglect since last visito No Secondary Verification Process Completed: Yes Hospitalized since last visit: No Patient Requires Transmission-Based No Implantable device outside of the clinic excluding No Precautions: cellular tissue based products placed in the center Patient Has Alerts: Yes since last visit: Patient Alerts: DMII Has Dressing in Place as Prescribed: Yes Has Compression in Place as Prescribed: No Pain Present Now: No Electronic Signature(s) Signed: 11/29/2017 4:18:40 PM By: Montey Hora Entered By: Montey Hora on 11/28/2017 09:14:13 Lye, [name] ([id number]) -------------------------------------------------------------------------------- Clinic Level of Care Assessment Details Patient Name: [name] Date of Service: 11/28/2017 9:00 AM Medical Record Number: [id number] Patient Account Number: [id number] Date of Birth/Sex: November 06, 1950 (67 y.o. M) Treating RN: Montey Hora Primary Care Deakin Lacek: Lamonte Sakai Other Clinician: Referring Sereen Schaff: Lamonte Sakai Treating Kamoni Depree/Extender: Cathie Olden in Treatment: 29 Clinic Level of Care  Assessment Items TOOL 4 Quantity Score []  - Use when only an EandM is performed on FOLLOW-UP visit 0 ASSESSMENTS - Nursing Assessment / Reassessment X - Reassessment of Co-morbidities (includes updates in patient status) 1 10 X- 1 5 Reassessment of Adherence to Treatment Plan ASSESSMENTS - Wound and Skin Assessment / Reassessment []  - Simple Wound Assessment / Reassessment - one wound 0 X- 5 5 Complex Wound Assessment / Reassessment - multiple wounds []  - 0 Dermatologic / Skin Assessment (not related to wound area) ASSESSMENTS - Focused Assessment X - Circumferential Edema Measurements - multi extremities 1 5 []  - 0 Nutritional Assessment / Counseling / Intervention X- 1 5 Lower Extremity Assessment (monofilament, tuning fork, pulses) []  - 0 Peripheral Arterial Disease Assessment (using hand held doppler) ASSESSMENTS - Ostomy and/or Continence Assessment and Care []  - Incontinence Assessment and Management 0 []  - 0 Ostomy Care Assessment and Management (repouching, etc.) PROCESS - Coordination of Care X - Simple Patient / Family Education for ongoing care 1 15 []  - 0 Complex (extensive) Patient / Family Education for ongoing care []  - 0 Staff obtains Programmer, systems, Records, Test Results / Process Orders []  - 0 Staff telephones HHA, Nursing Homes / Clarify orders / etc []  - 0 Routine Transfer to another Facility (non-emergent condition) []  - 0 Routine Hospital Admission (non-emergent condition) []  - 0 New Admissions / Biomedical engineer / Ordering NPWT, Apligraf, etc. []  - 0 Emergency Hospital Admission (emergent condition) X- 1 10 Simple Discharge Coordination Lipe, Orazio E. ([id number]) []  - 0 Complex (extensive) Discharge Coordination PROCESS - Special Needs []  - Pediatric / Minor Patient Management 0 []  - 0 Isolation Patient Management []  - 0 Hearing / Language / Visual special needs []  - 0 Assessment of Community assistance (transportation, D/C planning,  etc.) []  - 0 Additional assistance / Altered mentation []  - 0 Support Surface(s) Assessment (bed, cushion, seat, etc.) INTERVENTIONS - Wound Cleansing / Measurement []  - Simple Wound Cleansing - one  wound 0 X- 5 5 Complex Wound Cleansing - multiple wounds X- 1 5 Wound Imaging (photographs - any number of wounds) []  - 0 Wound Tracing (instead of photographs) []  - 0 Simple Wound Measurement - one wound X- 5 5 Complex Wound Measurement - multiple wounds INTERVENTIONS - Wound Dressings X - Small Wound Dressing one or multiple wounds 5 10 []  - 0 Medium Wound Dressing one or multiple wounds []  - 0 Large Wound Dressing one or multiple wounds []  - 0 Application of Medications - topical []  - 0 Application of Medications - injection INTERVENTIONS - Miscellaneous []  - External ear exam 0 []  - 0 Specimen Collection (cultures, biopsies, blood, body fluids, etc.) []  - 0 Specimen(s) / Culture(s) sent or taken to Lab for analysis []  - 0 Patient Transfer (multiple staff / Civil Service fast streamer / Similar devices) []  - 0 Simple Staple / Suture removal (25 or less) []  - 0 Complex Staple / Suture removal (26 or more) []  - 0 Hypo / Hyperglycemic Management (close monitor of Blood Glucose) []  - 0 Ankle / Brachial Index (ABI) - do not check if billed separately X- 1 5 Vital Signs Brazzle, Taylin E. ([id number]) Has the patient been seen at the hospital within the last three years: Yes Total Score: 185 Level Of Care: New/Established - Level 5 Electronic Signature(s) Signed: 11/29/2017 4:18:40 PM By: Montey Hora Entered By: Montey Hora on 11/28/2017 09:33:58 Floyd, [name] ([id number]) -------------------------------------------------------------------------------- Encounter Discharge Information Details Patient Name: [name], [name] Date of Service: 11/28/2017 9:00 AM Medical Record Number: [id number] Patient Account Number: [id number] Date of Birth/Sex: 18-Apr-1950 (67 y.o. M) Treating RN:  Montey Hora Primary Care Suzannah Bettes: Lamonte Sakai Other Clinician: Referring Malcolm Quast: Lamonte Sakai Treating Vontrell Pullman/Extender: Cathie Olden in Treatment: 54 Encounter Discharge Information Items Discharge Condition: Stable Ambulatory Status: Ambulatory Discharge Destination: Home Transportation: Private Auto Accompanied By: family Schedule Follow-up Appointment: Yes Clinical Summary of Care: Electronic Signature(s) Signed: 11/29/2017 4:18:40 PM By: Montey Hora Entered By: Montey Hora on 11/28/2017 09:39:09 Valvano, [name] ([id number]) -------------------------------------------------------------------------------- Lower Extremity Assessment Details Patient Name: Klos, Jakye E. Date of Service: 11/28/2017 9:00 AM Medical Record Number: [id number] Patient Account Number: [id number] Date of Birth/Sex: 06/03/1950 (67 y.o. M) Treating RN: Montey Hora Primary Care Taraoluwa Thakur: Lamonte Sakai Other Clinician: Referring Denali Sharma: Lamonte Sakai Treating Akshay Spang/Extender: Cathie Olden in Treatment: 61 Edema Assessment Assessed: [Left: No] [Right: No] [Left: Edema] [Right: :] Calf Left: Right: Point of Measurement: 34 cm From Medial Instep 35.9 cm 36.7 cm Ankle Left: Right: Point of Measurement: 12 cm From Medial Instep 23.8 cm 24.7 cm Vascular Assessment Pulses: Dorsalis Pedis Palpable: [Left:Yes] [Right:Yes] Posterior Tibial Extremity colors, hair growth, and conditions: Extremity Color: [Left:Hyperpigmented] [Right:Hyperpigmented] Hair Growth on Extremity: [Left:No] [Right:No] Temperature of Extremity: [Left:Warm] [Right:Warm] Capillary Refill: [Left:< 3 seconds] [Right:< 3 seconds] Toe Nail Assessment Left: Right: Thick: Yes Yes Discolored: Yes Yes Deformed: No No Improper Length and Hygiene: No No Electronic Signature(s) Signed: 11/29/2017 4:18:40 PM By: Montey Hora Entered By: Montey Hora on 11/28/2017 09:24:12 Brougher, Eliakim E.  ([id number]) -------------------------------------------------------------------------------- Multi Wound Chart Details Patient Name: Delpriore, Tarvaris E. Date of Service: 11/28/2017 9:00 AM Medical Record Number: [id number] Patient Account Number: [id number] Date of Birth/Sex: [birth year] (67 y.o. M) Treating RN: Montey Hora Primary Care Mordecai Tindol: Lamonte Sakai Other Clinician: Referring Daliah Chaudoin: Lamonte Sakai Treating Larayah Clute/Extender: Cathie Olden in Treatment: 58 Vital Signs Height(in): 69 Pulse(bpm): 57 Weight(lbs): 168 Blood Pressure(mmHg): 168/77 Body Mass Index(BMI): 25 Temperature(F): 98.0 Respiratory Rate 16 (breaths/min): Photos: [  1:No Photos] [14:No Photos] [16:No Photos] Wound Location: [1:Right Toe Great] [14:Left Toe Second] [16:Right Toe Great - Plantar] Wounding Event: [1:Gradually Appeared] [14:Gradually Appeared] [16:Not Known] 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] Comorbid History: [1:Anemia, Lymphedema, Congestive Heart Failure, Hypertension, Peripheral Venous Disease, Type II Diabetes, Neuropathy] [14:Anemia, Lymphedema, Congestive Heart Failure, Hypertension, Peripheral Venous Disease, Type II Diabetes,  Neuropathy] [16:Anemia, Lymphedema, Congestive Heart Failure, Hypertension, Peripheral Venous Disease, Type II Diabetes, Neuropathy] Date Acquired: [1:06/11/2016] [14:09/26/2017] [16:10/29/2017] Weeks of Treatment: [1:61] [14:9] [16:4] Wound Status: [1:Open] [14:Open] [16:Open] Pending Amputation on [1:Yes] [14:No] [16:No] Presentation: Measurements L x W x D [1:0.8x2.1x0.1] [14:1x1.4x0.1] [16:0.5x1.9x0.1] (cm) Area (cm) : [1:1.319] [14:1.1] [16:0.746] Volume (cm) : [1:0.132] [14:0.11] [16:0.075] % Reduction in Area: [1:97.20%] [14:53.30%] [16:53.40%] % Reduction in Volume: [1:97.20%] [14:53.40%] [16:53.10%] Classification:  [1:Grade 2] [14:Grade 1] [16:Grade 1] Exudate Amount: [1:Small] [14:Medium] [16:Medium] Exudate Type: [1:Serous] [14:Serous] [16:Serous] Exudate Color: [1:amber] [14:amber] [16:amber] Wound Margin: [1:Flat and Intact] [14:Flat and Intact] [16:Flat and Intact] Granulation Amount: [1:Large (67-100%)] [14:Large (67-100%)] [16:Large (67-100%)] Granulation Quality: [1:Pink] [14:Pink] [16:Pink] Necrotic Amount: [1:None Present (0%)] [14:None Present (0%)] [16:None Present (0%)] Exposed Structures: [1:Fat Layer (Subcutaneous Tissue) Exposed: Yes Fascia: No Tendon: No Muscle: No Joint: No Bone: No] [14:Fascia: No Fat Layer (Subcutaneous Tissue) Exposed: No Tendon: No Muscle: No Joint: No Bone: No] [16:Fat Layer (Subcutaneous Tissue) Exposed: Yes  Fascia: No Tendon: No Muscle: No Joint: No Bone: No] Epithelialization: [1:Medium (34-66%)] [14:Medium (34-66%)] [16:Small (1-33%)] Periwound Skin Texture: Excoriation: No Excoriation: No Excoriation: No Induration: No Induration: No Induration: No Callus: No Callus: No Callus: No Crepitus: No Crepitus: No Crepitus: No Rash: No Rash: No Rash: No Scarring: No Scarring: No Scarring: No Periwound Skin Moisture: Maceration: No Maceration: No Maceration: No Dry/Scaly: No Dry/Scaly: No Dry/Scaly: No Periwound Skin Color: Atrophie Blanche: No Atrophie Blanche: No Atrophie Blanche: No Cyanosis: No Cyanosis: No Cyanosis: No Ecchymosis: No Ecchymosis: No Ecchymosis: No Erythema: No Erythema: No Erythema: No Hemosiderin Staining: No Hemosiderin Staining: No Hemosiderin Staining: No Mottled: No Mottled: No Mottled: No Pallor: No Pallor: No Pallor: No Rubor: No Rubor: No Rubor: No Temperature: No Abnormality No Abnormality No Abnormality Tenderness on Palpation: No No No Wound Preparation: Ulcer Cleansing: Ulcer Cleansing: Ulcer Cleansing: Rinsed/Irrigated with Saline Rinsed/Irrigated with Saline Rinsed/Irrigated with  Saline Topical Anesthetic Applied: Topical Anesthetic Applied: Topical Anesthetic Applied: Other: lidocaine 4% Other: lidocaine 4% Other: lidocaine 4% Wound Number: 2 5 N/A Photos: No Photos No Photos N/A Wound Location: Right Toe Second Left Toe Great N/A Wounding Event: Gradually Appeared Gradually Appeared N/A Primary Etiology: Diabetic Wound/Ulcer of the Diabetic Wound/Ulcer of the N/A Lower Extremity Lower Extremity Comorbid History: Anemia, Lymphedema, Anemia, Lymphedema, N/A Congestive Heart Failure, Congestive Heart Failure, Hypertension, Peripheral Hypertension, Peripheral Venous Disease, Type II Venous Disease, Type II Diabetes, Neuropathy Diabetes, Neuropathy Date Acquired: 06/11/2016 06/11/2016 N/A Weeks of Treatment: 61 61 N/A Wound Status: Open Open N/A Pending Amputation on Yes Yes N/A Presentation: Measurements L x W x D 0.8x2.1x0.1 0.3x0.2x0.1 N/A (cm) Area (cm) : 1.319 0.047 N/A Volume (cm) : 0.132 0.005 N/A % Reduction in Area: 57.00% 99.70% N/A % Reduction in Volume: 57.00% 99.70% N/A Classification: Grade 2 Grade 2 N/A Exudate Amount: Medium Medium N/A Exudate Type: Serous Serous N/A Exudate Color: amber amber N/A Wound Margin: Flat and Intact Flat and Intact N/A Granulation Amount: Large (67-100%) Large (67-100%) N/A Granulation Quality: Pink Pink N/A Necrotic Amount: None Present (  0%) None Present (0%) N/A Exposed Structures: Fascia: No Fat Layer (Subcutaneous N/A Fat Layer (Subcutaneous Tissue) Exposed: Yes Tissue) Exposed: No Fascia: No Tendon: No Tendon: No Bramblett, Lindley E. ([id number]) Muscle: No Muscle: No Joint: No Joint: No Bone: No Bone: No Epithelialization: Medium (34-66%) Medium (34-66%) N/A Periwound Skin Texture: Excoriation: No Excoriation: No N/A Induration: No Induration: No Callus: No Callus: No Crepitus: No Crepitus: No Rash: No Rash: No Scarring: No Scarring: No Periwound Skin Moisture: Maceration:  No Maceration: No N/A Dry/Scaly: No Dry/Scaly: No Periwound Skin Color: Atrophie Blanche: No Atrophie Blanche: No N/A Cyanosis: No Cyanosis: No Ecchymosis: No Ecchymosis: No Erythema: No Erythema: No Hemosiderin Staining: No Hemosiderin Staining: No Mottled: No Mottled: No Pallor: No Pallor: No Rubor: No Rubor: No Temperature: No Abnormality No Abnormality N/A Tenderness on Palpation: No No N/A Wound Preparation: Ulcer Cleansing: Ulcer Cleansing: N/A Rinsed/Irrigated with Saline Rinsed/Irrigated with Saline Topical Anesthetic Applied: Topical Anesthetic Applied: Other: lidocaine 4% Other: lidocaine 4% Treatment Notes Electronic Signature(s) Signed: 11/28/2017 9:38:21 AM By: Lawanda Cousins Entered By: Lawanda Cousins on 11/28/2017 09:38:21 Belardo, [name] ([id number]) -------------------------------------------------------------------------------- [place] Details Patient Name: Bondarenko, Ebon E. Date of Service: 11/28/2017 9:00 AM Medical Record Number: [id number] Patient Account Number: [id number] Date of Birth/Sex: 05/10/1950 (67 y.o. M) Treating RN: Montey Hora Primary Care Mischell Branford: Lamonte Sakai Other Clinician: Referring Johncarlos Holtsclaw: Lamonte Sakai Treating Madelline Eshbach/Extender: Cathie Olden in Treatment: 49 Active Inactive ` Abuse / Safety / Falls / Self Care Management Nursing Diagnoses: Potential for falls Goals: Patient will remain injury free related to falls Date Initiated: 09/21/2016 Target Resolution Date: 09/14/2017 Goal Status: Active Interventions: Assess fall risk on admission and as needed Notes: ` Nutrition Nursing Diagnoses: Potential for alteratiion in Nutrition/Potential for imbalanced nutrition Goals: Patient/caregiver agrees to and verbalizes understanding of need to use nutritional supplements and/or vitamins as prescribed Date Initiated: 09/21/2016 Target Resolution Date: 09/14/2017 Goal Status:  Active Interventions: Assess patient nutrition upon admission and as needed per policy Notes: ` Orientation to the Wound Care Program Nursing Diagnoses: Knowledge deficit related to the wound healing center program Goals: Patient/caregiver will verbalize understanding of the [facility] Date Initiated: 09/21/2016 Target Resolution Date: 06/15/2017 Goal Status: Active Interventions: DARKPhenix, Vandermeulen ([id number]) Provide education on orientation to the wound center Notes: ` Wound/Skin Impairment Nursing Diagnoses: Knowledge deficit related to smoking impact on wound healing Goals: Ulcer/skin breakdown will have a volume reduction of 30% by week 4 Date Initiated: 09/21/2016 Target Resolution Date: 08/17/2017 Goal Status: Active Ulcer/skin breakdown will have a volume reduction of 50% by week 8 Date Initiated: 09/21/2016 Target Resolution Date: 08/17/2017 Goal Status: Active Ulcer/skin breakdown will have a volume reduction of 80% by week 12 Date Initiated: 09/21/2016 Target Resolution Date: 09/14/2017 Goal Status: Active Ulcer/skin breakdown will heal within 14 weeks Date Initiated: 09/21/2016 Target Resolution Date: 08/17/2017 Goal Status: Active Interventions: Assess patient/caregiver ability to obtain necessary supplies Assess patient/caregiver ability to perform ulcer/skin care regimen upon admission and as needed Assess ulceration(s) every visit Notes: Electronic Signature(s) Signed: 11/29/2017 4:18:40 PM By: Montey Hora Entered By: Montey Hora on 11/28/2017 09:28:11 Farace, Taggert E. ([id number]) -------------------------------------------------------------------------------- Pain Assessment Details Patient Name: Guier, Leeland E. Date of Service: 11/28/2017 9:00 AM Medical Record Number: [id number] Patient Account Number: [id number] Date of Birth/Sex: 02-20-1951 (67 y.o. M) Treating RN: Montey Hora Primary Care Melynda Krzywicki: Lamonte Sakai Other  Clinician: Referring Julieta Rogalski: Lamonte Sakai Treating Naia Ruff/Extender: Cathie Olden in Treatment: 98 Active Problems Location of Pain Severity and  Description of Pain Patient Has Paino No Site Locations Pain Management and Medication Current Pain Management: Electronic Signature(s) Signed: 11/29/2017 4:18:40 PM By: Montey Hora Entered By: Montey Hora on 11/28/2017 09:14:19 Bonine, [name] ([id number]) -------------------------------------------------------------------------------- Patient/Caregiver Education Details Patient Name: Luckett, Qaadir E. Date of Service: 11/28/2017 9:00 AM Medical Record Number: [id number] Patient Account Number: [id number] Date of Birth/Gender: May 10, 1950 (67 y.o. M) Treating RN: Montey Hora Primary Care Physician: Lamonte Sakai Other Clinician: Referring Physician: Lamonte Sakai Treating Physician/Extender: Cathie Olden in Treatment: 49 Education Assessment Education Provided To: Patient Education Topics Provided Wound/Skin Impairment: Handouts: Caring for Your Ulcer, Skin Care Do's and Dont's, Other: change dressing as ordered Methods: Demonstration, Explain/Verbal Responses: State content correctly Electronic Signature(s) Signed: 11/29/2017 4:18:40 PM By: Montey Hora Entered By: Montey Hora on 11/28/2017 09:39:29 Chinn, Aaren E. ([id number]) -------------------------------------------------------------------------------- Wound Assessment Details Patient Name: Hecht, Mang E. Date of Service: 11/28/2017 9:00 AM Medical Record Number: [id number] Patient Account Number: [id number] Date of Birth/Sex: 11/29/1950 (67 y.o. M) Treating RN: Montey Hora Primary Care Jhoselin Crume: Lamonte Sakai Other Clinician: Referring Borden Thune: Lamonte Sakai Treating Baleigh Rennaker/Extender: Cathie Olden in Treatment: 61 Wound Status Wound Number: 1 Primary Diabetic Wound/Ulcer of the Lower Extremity Etiology: Wound Location: Right Toe  Great Wound Open Wounding Event: Gradually Appeared Status: Date Acquired: 06/11/2016 Comorbid Anemia, Lymphedema, Congestive Heart Weeks Of Treatment: 61 History: Failure, Hypertension, Peripheral Venous Clustered Wound: No Disease, Type II Diabetes, Neuropathy Pending Amputation On Presentation Photos Photo Uploaded By: Montey Hora on 11/28/2017 13:51:53 Wound Measurements Length: (cm) 0.8 Width: (cm) 2.1 Depth: (cm) 0.1 Area: (cm) 1.319 Volume: (cm) 0.132 % Reduction in Area: 97.2% % Reduction in Volume: 97.2% Epithelialization: Medium (34-66%) Tunneling: No Undermining: No Wound Description Classification: Grade 2 Wound Margin: Flat and Intact Exudate Amount: Small Exudate Type: Serous Exudate Color: amber Foul Odor After Cleansing: No Slough/Fibrino No Wound Bed Granulation Amount: Large (67-100%) Exposed Structure Granulation Quality: Pink Fascia Exposed: No Necrotic Amount: None Present (0%) Fat Layer (Subcutaneous Tissue) Exposed: Yes Tendon Exposed: No Muscle Exposed: No Joint Exposed: No Bone Exposed: No Periwound Skin Texture Commins, Terryn E. ([id number]) Texture Color No Abnormalities Noted: No No Abnormalities Noted: No Callus: No Atrophie Blanche: No Crepitus: No Cyanosis: No Excoriation: No Ecchymosis: No Induration: No Erythema: No Rash: No Hemosiderin Staining: No Scarring: No Mottled: No Pallor: No Moisture Rubor: No No Abnormalities Noted: No Dry / Scaly: No Temperature / Pain Maceration: No Temperature: No Abnormality Wound Preparation Ulcer Cleansing: Rinsed/Irrigated with Saline Topical Anesthetic Applied: Other: lidocaine 4%, Treatment Notes Wound #1 (Right Toe Great) 1. Cleansed with: Clean wound with Normal Saline 2. Anesthetic Topical Lidocaine 4% cream to wound bed prior to debridement 4. Dressing Applied: Other dressing (specify in notes) 5. Secondary Dressing Applied ABD Pad Kerlix/Conform 7. Secured  with Tape Notes silvercel Electronic Signature(s) Signed: 11/29/2017 4:18:40 PM By: Montey Hora Entered By: Montey Hora on 11/28/2017 09:22:18 Shiplett, [name] ([id number]) -------------------------------------------------------------------------------- Wound Assessment Details Patient Name: Jernigan, Lamonta E. Date of Service: 11/28/2017 9:00 AM Medical Record Number: [id number] Patient Account Number: [id number] Date of Birth/Sex: 1950-06-08 (67 y.o. M) Treating RN: Montey Hora Primary Care Eleen Litz: Lamonte Sakai Other Clinician: Referring Daira Hine: Lamonte Sakai Treating Alois Mincer/Extender: Cathie Olden in Treatment: 61 Wound Status Wound Number: 14 Primary Diabetic Wound/Ulcer of the Lower Extremity Etiology: Wound Location: Left Toe Second Wound Open Wounding Event: Gradually Appeared Status: Date Acquired: 09/26/2017 Comorbid Anemia, Lymphedema, Congestive Heart Weeks Of Treatment: 9 History: Failure, Hypertension, Peripheral Venous Clustered Wound: No  Disease, Type II Diabetes, Neuropathy Photos Photo Uploaded By: Montey Hora on 11/28/2017 13:51:54 Wound Measurements Length: (cm) 1 Width: (cm) 1.4 Depth: (cm) 0.1 Area: (cm) 1.1 Volume: (cm) 0.11 % Reduction in Area: 53.3% % Reduction in Volume: 53.4% Epithelialization: Medium (34-66%) Tunneling: No Undermining: No Wound Description Classification: Grade 1 Wound Margin: Flat and Intact Exudate Amount: Medium Exudate Type: Serous Exudate Color: amber Foul Odor After Cleansing: No Slough/Fibrino No Wound Bed Granulation Amount: Large (67-100%) Exposed Structure Granulation Quality: Pink Fascia Exposed: No Necrotic Amount: None Present (0%) Fat Layer (Subcutaneous Tissue) Exposed: No Tendon Exposed: No Muscle Exposed: No Joint Exposed: No Bone Exposed: No Periwound Skin Texture Kwolek, Labarron E. ([id number]) Texture Color No Abnormalities Noted: No No Abnormalities Noted: No Callus:  No Atrophie Blanche: No Crepitus: No Cyanosis: No Excoriation: No Ecchymosis: No Induration: No Erythema: No Rash: No Hemosiderin Staining: No Scarring: No Mottled: No Pallor: No Moisture Rubor: No No Abnormalities Noted: No Dry / Scaly: No Temperature / Pain Maceration: No Temperature: No Abnormality Wound Preparation Ulcer Cleansing: Rinsed/Irrigated with Saline Topical Anesthetic Applied: Other: lidocaine 4%, Treatment Notes Wound #14 (Left Toe Second) 1. Cleansed with: Clean wound with Normal Saline 2. Anesthetic Topical Lidocaine 4% cream to wound bed prior to debridement 4. Dressing Applied: Other dressing (specify in notes) 5. Secondary Dressing Applied ABD Pad Kerlix/Conform 7. Secured with Tape Notes silvercel Electronic Signature(s) Signed: 11/29/2017 4:18:40 PM By: Montey Hora Entered By: Montey Hora on 11/28/2017 09:22:32 Leger, [name] ([id number]) -------------------------------------------------------------------------------- Wound Assessment Details Patient Name: Vokes, Trelyn E. Date of Service: 11/28/2017 9:00 AM Medical Record Number: [id number] Patient Account Number: [id number] Date of Birth/Sex: 07/19/1950 (67 y.o. M) Treating RN: Montey Hora Primary Care Kerri Asche: Lamonte Sakai Other Clinician: Referring Annalee Meyerhoff: Lamonte Sakai Treating Alyze Lauf/Extender: Cathie Olden in Treatment: 61 Wound Status Wound Number: 16 Primary Diabetic Wound/Ulcer of the Lower Extremity Etiology: Wound Location: Right Toe Great - Plantar Wound Open Wounding Event: Not Known Status: Date Acquired: 10/29/2017 Comorbid Anemia, Lymphedema, Congestive Heart Weeks Of Treatment: 4 History: Failure, Hypertension, Peripheral Venous Clustered Wound: No Disease, Type II Diabetes, Neuropathy Photos Photo Uploaded By: Montey Hora on 11/28/2017 13:52:18 Wound Measurements Length: (cm) 0.5 Width: (cm) 1.9 Depth: (cm) 0.1 Area: (cm) 0.746 Volume:  (cm) 0.075 % Reduction in Area: 53.4% % Reduction in Volume: 53.1% Epithelialization: Small (1-33%) Tunneling: No Undermining: No Wound Description Classification: Grade 1 Wound Margin: Flat and Intact Exudate Amount: Medium Exudate Type: Serous Exudate Color: amber Foul Odor After Cleansing: No Slough/Fibrino Yes Wound Bed Granulation Amount: Large (67-100%) Exposed Structure Granulation Quality: Pink Fascia Exposed: No Necrotic Amount: None Present (0%) Fat Layer (Subcutaneous Tissue) Exposed: Yes Tendon Exposed: No Muscle Exposed: No Joint Exposed: No Bone Exposed: No Periwound Skin Texture Hinkson, Lc E. ([id number]) Texture Color No Abnormalities Noted: No No Abnormalities Noted: No Callus: No Atrophie Blanche: No Crepitus: No Cyanosis: No Excoriation: No Ecchymosis: No Induration: No Erythema: No Rash: No Hemosiderin Staining: No Scarring: No Mottled: No Pallor: No Moisture Rubor: No No Abnormalities Noted: No Dry / Scaly: No Temperature / Pain Maceration: No Temperature: No Abnormality Wound Preparation Ulcer Cleansing: Rinsed/Irrigated with Saline Topical Anesthetic Applied: Other: lidocaine 4%, Treatment Notes Wound #16 (Right, Plantar Toe Great) 1. Cleansed with: Clean wound with Normal Saline 2. Anesthetic Topical Lidocaine 4% cream to wound bed prior to debridement 4. Dressing Applied: Other dressing (specify in notes) 5. Secondary Dressing Applied ABD Pad Kerlix/Conform 7. Secured with Tape Notes silvercel Electronic Signature(s) Signed: 11/29/2017 4:18:40  PM By: Montey Hora Entered By: Montey Hora on 11/28/2017 09:22:45 Farabaugh, [name] ([id number]) -------------------------------------------------------------------------------- Wound Assessment Details Patient Name: Belcourt, Suleiman E. Date of Service: 11/28/2017 9:00 AM Medical Record Number: [id number] Patient Account Number: [id number] Date of Birth/Sex: May 14, 1950 (67 y.o.  M) Treating RN: Montey Hora Primary Care Harles Evetts: Lamonte Sakai Other Clinician: Referring Krisanne Lich: Lamonte Sakai Treating Trivia Heffelfinger/Extender: Cathie Olden in Treatment: 61 Wound Status Wound Number: 2 Primary Diabetic Wound/Ulcer of the Lower Extremity Etiology: Wound Location: Right Toe Second Wound Open Wounding Event: Gradually Appeared Status: Date Acquired: 06/11/2016 Comorbid Anemia, Lymphedema, Congestive Heart Weeks Of Treatment: 61 History: Failure, Hypertension, Peripheral Venous Clustered Wound: No Disease, Type II Diabetes, Neuropathy Pending Amputation On Presentation Photos Photo Uploaded By: Montey Hora on 11/28/2017 13:52:19 Wound Measurements Length: (cm) 0.8 Width: (cm) 2.1 Depth: (cm) 0.1 Area: (cm) 1.319 Volume: (cm) 0.132 % Reduction in Area: 57% % Reduction in Volume: 57% Epithelialization: Medium (34-66%) Tunneling: No Undermining: No Wound Description Classification: Grade 2 Wound Margin: Flat and Intact Exudate Amount: Medium Exudate Type: Serous Exudate Color: amber Foul Odor After Cleansing: No Slough/Fibrino No Wound Bed Granulation Amount: Large (67-100%) Exposed Structure Granulation Quality: Pink Fascia Exposed: No Necrotic Amount: None Present (0%) Fat Layer (Subcutaneous Tissue) Exposed: No Tendon Exposed: No Muscle Exposed: No Joint Exposed: No Bone Exposed: No Periwound Skin Texture Kilman, Margues E. ([id number]) Texture Color No Abnormalities Noted: No No Abnormalities Noted: No Callus: No Atrophie Blanche: No Crepitus: No Cyanosis: No Excoriation: No Ecchymosis: No Induration: No Erythema: No Rash: No Hemosiderin Staining: No Scarring: No Mottled: No Pallor: No Moisture Rubor: No No Abnormalities Noted: No Dry / Scaly: No Temperature / Pain Maceration: No Temperature: No Abnormality Wound Preparation Ulcer Cleansing: Rinsed/Irrigated with Saline Topical Anesthetic Applied: Other: lidocaine  4%, Treatment Notes Wound #2 (Right Toe Second) 1. Cleansed with: Clean wound with Normal Saline 2. Anesthetic Topical Lidocaine 4% cream to wound bed prior to debridement 4. Dressing Applied: Other dressing (specify in notes) 5. Secondary Dressing Applied ABD Pad Kerlix/Conform 7. Secured with Tape Notes silvercel Electronic Signature(s) Signed: 11/29/2017 4:18:40 PM By: Montey Hora Entered By: Montey Hora on 11/28/2017 09:23:01 Kaps, [name] ([id number]) -------------------------------------------------------------------------------- Wound Assessment Details Patient Name: Bree, Colon E. Date of Service: 11/28/2017 9:00 AM Medical Record Number: [id number] Patient Account Number: [id number] Date of Birth/Sex: 04-29-1950 (67 y.o. M) Treating RN: Montey Hora Primary Care Mansur Patti: Lamonte Sakai Other Clinician: Referring Mauria Asquith: Lamonte Sakai Treating Mattia Osterman/Extender: Cathie Olden in Treatment: 61 Wound Status Wound Number: 5 Primary Diabetic Wound/Ulcer of the Lower Extremity Etiology: Wound Location: Left Toe Great Wound Open Wounding Event: Gradually Appeared Status: Date Acquired: 06/11/2016 Comorbid Anemia, Lymphedema, Congestive Heart Weeks Of Treatment: 61 History: Failure, Hypertension, Peripheral Venous Clustered Wound: No Disease, Type II Diabetes, Neuropathy Pending Amputation On Presentation Photos Photo Uploaded By: Montey Hora on 11/28/2017 13:52:46 Wound Measurements Length: (cm) 0.3 Width: (cm) 0.2 Depth: (cm) 0.1 Area: (cm) 0.047 Volume: (cm) 0.005 % Reduction in Area: 99.7% % Reduction in Volume: 99.7% Epithelialization: Medium (34-66%) Tunneling: No Undermining: No Wound Description Classification: Grade 2 Wound Margin: Flat and Intact Exudate Amount: Medium Exudate Type: Serous Exudate Color: amber Foul Odor After Cleansing: No Slough/Fibrino No Wound Bed Granulation Amount: Large (67-100%) Exposed  Structure Granulation Quality: Pink Fascia Exposed: No Necrotic Amount: None Present (0%) Fat Layer (Subcutaneous Tissue) Exposed: Yes Tendon Exposed: No Muscle Exposed: No Joint Exposed: No Bone Exposed: No Periwound Skin Texture Gatliff, Colton E. ([id number]) Texture Color No Abnormalities  Noted: No No Abnormalities Noted: No Callus: No Atrophie Blanche: No Crepitus: No Cyanosis: No Excoriation: No Ecchymosis: No Induration: No Erythema: No Rash: No Hemosiderin Staining: No Scarring: No Mottled: No Pallor: No Moisture Rubor: No No Abnormalities Noted: No Dry / Scaly: No Temperature / Pain Maceration: No Temperature: No Abnormality Wound Preparation Ulcer Cleansing: Rinsed/Irrigated with Saline Topical Anesthetic Applied: Other: lidocaine 4%, Treatment Notes Wound #5 (Left Toe Great) 1. Cleansed with: Clean wound with Normal Saline 2. Anesthetic Topical Lidocaine 4% cream to wound bed prior to debridement 4. Dressing Applied: Other dressing (specify in notes) 5. Secondary Dressing Applied ABD Pad Kerlix/Conform 7. Secured with Tape Notes silvercel Electronic Signature(s) Signed: 11/29/2017 4:18:40 PM By: Montey Hora Entered By: Montey Hora on 11/28/2017 09:23:17 Jakubowicz, [name] ([id number]) -------------------------------------------------------------------------------- Vitals Details Patient Name: Mckissic, Wright E. Date of Service: 11/28/2017 9:00 AM Medical Record Number: [id number] Patient Account Number: [id number] Date of Birth/Sex: 13-Apr-1950 (67 y.o. M) Treating RN: Montey Hora Primary Care Korene Dula: Lamonte Sakai Other Clinician: Referring Yisrael Obryan: Lamonte Sakai Treating Fynn Vanblarcom/Extender: Cathie Olden in Treatment: 39 Vital Signs Time Taken: 09:14 Temperature (F): 98.0 Height (in): 69 Pulse (bpm): 57 Weight (lbs): 168 Respiratory Rate (breaths/min): 16 Body Mass Index (BMI): 24.8 Blood Pressure (mmHg): 168/77 Reference  Range: 80 - 120 mg / dl Electronic Signature(s) Signed: 11/29/2017 4:18:40 PM By: Montey Hora Entered By: Montey Hora on 11/28/2017 09:15:23

## 2017-12-11 NOTE — Progress Notes (Signed)
[name], [name] ([id number]) Visit Report for 11/28/2017 Chief Complaint Document Details Patient Name: [name], [name] Date of Service: 11/28/2017 9:00 AM Medical Record Number: [id number] Patient Account Number: [id number] Date of Birth/Sex: [birth year] (67 y.o. M) Treating RN: Ahmed Prima Primary Care Provider: Lamonte Sakai Other Clinician: Referring Provider: Lamonte Sakai Treating Provider/Extender: Cathie Olden in Treatment: 27 Information Obtained from: Patient Chief Complaint Patient presents for treatment of an open diabetic ulcer to both feet Electronic Signature(s) Signed: 11/28/2017 9:39:05 AM By: Lawanda Cousins Entered By: Lawanda Cousins on 11/28/2017 09:39:05 [name] ([id number]) -------------------------------------------------------------------------------- HPI Details Patient Name: [name], [name] Date of Service: 11/28/2017 9:00 AM Medical Record Number: [id number] Patient Account Number: [id number] Date of Birth/Sex: [birth year] (67 y.o. M) Treating RN: Ahmed Prima Primary Care Provider: Lamonte Sakai Other Clinician: Referring Provider: Lamonte Sakai Treating Provider/Extender: Cathie Olden in Treatment: 77 History of Present Illness Location: bilateral feet ulceration on the toes Quality: Patient reports experiencing a dull pain to affected area(s). Severity: Patient states wound are getting better Duration: Patient has had the wound for > 3 months prior to seeking treatment at the wound center Timing: Pain in wound is constant (hurts all the time) Context: The wound would happen gradually Modifying Factors: Other treatment(s) tried include:treatment for lymphedema and is seen by the podiatrist Dr. Caryl Comes Associated Signs and Symptoms: Patient reports having increase swelling. HPI Description: 67 year old patient here to see as for bilateral feet ulceration to on his left first and second toe and 2 on his right first and second toe, which she's  had for about 4 months. 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 Donella Stade. The patient is also undergoing workup by medical oncology for a intra-abdominal lymphadenopathy. 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 He is also status post appendectomy, inguinal lymph node biopsy, rectal biopsy and rectal examination under anesthesia. he currently smokes cigarettes about half packet a day. In March of this year he was seen by Dr. Hortencia Pilar, for evaluation of bilateral varicose veins and besides wearing compression stockings he had recommended laser ablation of the right and left great saphenous veins to eleviate the symptoms and complications of severe superficial venous reflux disease. He also recommended lymphedema pumps for better control of his lymphedema. The patient recently has had on 08/23/2016, right greater saphenous vein ablation with the laser energy Earlier lower extremity venous reflux examination done on 05/08/2016 showed no DVT or SVT both lower legs but incompetence of bilateral great saphenous veins was present. A lower arterial study was also done and there was no significant right lower and left lower extremity problems based on a normal toe brachial index bilaterally and the ABI was 1.21 the left and 1.23 on the right. His post ablation venous duplex examination showed successful ablation of the right GS vein with thrombus formation 2 below the right saphenofemoral junction. The deep system was patent without evidence of thrombosis and this was done on 08/30/2016. 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 Addendum:  regarding his x-rays done today and x-ray of the left foot -- IMPRESSION: No objective evidence of osteomyelitis. There are soft tissue changes which  may reflect cellulitis. X-ray of the right foot -- IMPRESSION:Findings compatible with cellulitis of the toes. No objective evidence of osteomyelitis is observed. 10/01/16 on evaluation today patient's wounds appeared to be doing some better. I did review the x-rays as well which showed no evidence of osteomyelitis although there was evidence on x-ray of cellulitis. He fortunately is not having any discomfort although he continues to have some swelling. He does not remember being on any antibiotics recently. 10/15/16 on evaluation today patient's wounds overall appear to be doing better although he does have a new location noted on the left foot. Fortunately he is not having significant pain. It almost has the appearance that something is rubbing on the end of his toes but he wears the open toe shoes and according to what he is telling me never wears anything that would rub on his foot. There is no evidence of infection and specifically no evidence of a fungal infection 10/22/16 On evaluation today patient's wounds appeared to be doing better compared to last week in regard to his bilateral [name], [name] ([id number]) lower extremities. Fortunately I happy with how things are progressing although he still has ulcers I feel like that he is improving and appropriate manner. 11/12/16 on evaluation today patient appears to be doing well in regard to his bilateral feet and the respective wounds. We have been using surrounding her dressings along with an antifungal cream which seems to be doing very well. He has no bilateral dysfunction noticed that the rituals are weight loss at this point. He also has no nausea or vomiting a note purulent discharge. He did see Vein and vascular today and he tells me that they told him he could have surgery for his venous stasis  but they did not feel like it was worth it in his words. Fortunately patient's wounds do appear to be getting sneakily better. 11/26/2016 -- he says he is going to have some surgery during this week at [facility] for possibly a colon resection. 12/31/2016 -- the patient has been noncompliant with his smoking and I'm not sure whether he is also started drinking again. He continues to be very nonchalant about his care 01/14/2017 -- the patient's HandP has been reviewed well and I understand he is being compliant with trying to give up smoking and his local dressing changes. He does not have any surgical options of 4 to him by his vascular surgeons.he was last seen in early August by Dr. Hortencia Pilar who recommended compression stockings,and possibly lymph pumps in 2-3 months after doing a review ultrasound. 01/28/2017 - the patient did not have any fresh complaints but on examination I noted a large lacerated wound on the plantar aspect of his right fourth toe which had a lot of necrotic debris and it probes down to bone. 02/07/2017 -- x-ray of the right foot -- IMPRESSION: Soft tissue swelling about the first through fourth toes consistent with cellulitis. New destructive change in the tuft of the distal phalanx of the great toe is consistent with osteomyelitis. 02/14/2017 -- the patient's MRI is pending this coming Monday and he still continues to smoke. We have again gone over off loading of his wounds in great detail and he says he's been compliant. 02/21/2017 -- MR of the right foot -- IMPRESSION: 1. Soft tissue ulcer at the tip of the first, second and third toe knows. Cortical irregularity and bone marrow edema in the first distal phalanx most concerning for osteomyelitis. Mild marrow edema  in the second and third distal phalanx without definite cortical destruction which may reflect early osteomyelitis versus reactive marrow edema. 2. Soft tissue edema surrounding the first phalanx  most consistent with cellulitis. the patient was also recently evaluated by his medical oncologist Dr. 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 She is treating him with weekly Procrit. She is also keeping intra-abdominal lymphadenopathy and right lower lobe lung nodule under observation. 04/04/2017 -- he was seen by Dr. Adrian Prows on 03/25/2017 -- after review he empirically put him on ciprofloxacin and doxycycline as they have good bone penetration and good bioavailability and it will cover the usual pathogens and diabetic foot osteomyelitis. He will check inflammatory markers and plan a 31-11 week old records. C-reactive protein was 0.3 and the ESR was 72 04/18/17 on evaluation today patient appears to be doing about the same in regard to his lower extremity wounds bilaterally. He has continued to use the antifungal cream which does seem to be beneficial. Nonetheless the ulcers do seem to in some areas be epithelial eyes over and in other areas are still open. He is having no significant discomfort. 04/25/17-he is here in follow-up evaluation for multiple ulcerations to multiple toes bilaterally. He states he did see Dr. Ola Spurr again last week and continues antibiotic therapy. He is voicing no complaints or concerns, will continue with current treatment plan will possibility of adding compression therapy next week after an additional week of treatment/lotions to BLE prescribed by Dr Ola Spurr 05/02/17 he is here in follow up for for multiple ulcers to multiple toes bilaterally. we will stop using antifungal cream and will continue with silvercel and follow up next week 05/09/17-he is here in follow-up for multiple ulcerations to multiple toes bilaterally. There is improvement in appearance. He has not completely stopped using antifungal cream, but admits he has not using it between the toes. He has an appointment with  Dr. Ola Spurr on 2/11, continues on doxycycline and Cipro. It has been 5 weeks of antibiotic therapy, we will order plain film xray to evaluate for osteomyelitis next week, prior to follow up with ID. Will continue with silvercel and follow up next week 05/16/17-he is here in follow-up evaluation for multiple ulcerations to multiple toes bilaterally and new wound to the right posterior heel. There is essentially no change in appearance, deteriorating measurements; he has a history of waxing and waning measurements. He admits that he continues to apply moisturizer/cream/ointment to his toes despite weekly reminders to only apply silvercel to his toes. He states that he thinks the surgical shoe contributed to the superficial ulcer to his posterior [name], [name] ([id number]) heel, he is unable to articulate if this was an area of dry cracked skin as he has a similar area to the left heel. He now is wearing open toed slippers. He has an appointment with Dr. Ola Spurr on 2/11. We have ordered x-rays for her bilateral feet; he was advised to obtain the x-rays today or tomorrow. He will follow-up next week 05/23/17-he is here in follow-up evaluation for multiple ulcerations to multiple toes bilaterally and the right posterior heel. There is improvement in maceration. He has been compliant and not applying any moisturizing agent to his toes. He has been using Lac-Hydrin for his lower extremities with improvement. He did not go to his appointment on Monday with Dr. Ola Spurr secondary to financial concerns. X-rays for her bilateral feet showed: LEFT FOOT with slight  erosion of the tuft of the distal phalanges of the left first and second toe suspicious for osteomyelitis, RIGHT FOOT with 1.erosion of the tufts of the distal phalanges of the right first second and possibly third toes consistent with osteomyelitis, 2 no definitive abnormality of the calcaneus is seen on the images obtained, 3. Plantar calcaneal  degenerative spur. We briefly discussed hyperbaric adjunctive therapy for treatment of chronic refractory osteomyelitis. I do not find an a1c in EMR, will contact PCP for record, or order if needed. He has been encouraged to contact Dr Ola Spurr office regarding the follow-up appointment, encouraged him to inquire about payment plan. We will continue with same treatment plan and follow-up next week. He states he is still taking antibiotics and has "a lot" left. He states he has been taking them as directed, 2 pills twice daily. According to Dr. Blane Ohara office notes he was originally started on 12/17 for 4 weeks and extended on 1/14 for an additional 4 weeks. He should be done with his antibiotic therapy, he was advised to bring his bottles and to his next appointment, we will contact pharmacy. 05/30/17-he is here in follow-up evaluation for multiple ulcerations to multiple toes bilaterally and the right posterior heel. He is accompanied by his brother-in-law. Wounds are stable. He has yet to make up with Dr. Ola Spurr. We contacted his PCP, with no record of recent A1c we will draw an A1c. His brother-in-law states that he was taken off all of his diabetic medication secondary to kidney function. 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 The antibiotics were counted and have approximately 2 weeks left, although they should be complete. The pill organizer reveals missing days. We discussed the need for consistent medications, to have optimal benefit of medication. He has a cousin that lives with him and he will ask her to check his organizer daily. He has been advised to follow up with Dr Ola Spurr, and will go by the office today. He has been advised to quit smoking. 06/06/17-he is here in follow up evaluation. He has had to make an appointment with Dr. Ola Spurr. He did have blood  work obtained, a1c 5. He continues to take antibiotic therapy. Significant improvement in bilateral lower extremity edema with compression therapy. Essentially no change in ulcerations to toes. He states he is "going to try something different" and "let me know next week" if it works; he would not provide any additional information and was encouraged to follow our orders. We will follow up next week 06/13/17-he is here in follow-up evaluation. He has an appointment with Dr. Ola Spurr tomorrow morning. He states he purchased an ointment from [facility] and applied to his toes for 3 days, he does not remember the name of the ointment. There is improvement to his wounds, minimal maceration. He continues to take antibiotic therapy, this should have been completed last month. His brother-in-law who regularly accompanies his appointments was asked to take the bottles to the appointment tomorrow with Dr. Ola Spurr so he is aware. We will continue with 3 layer compression, and order OPEN TOE compression 20-30mmHg; we will apply compression stockings next week. He continues to smoke, smoked "2 cigarettes" last week 06/20/17 on evaluation today patient did receive his compression stockings which he has with him today for both lower extremities. With that being said he tells me at this point in time that he is very happy to have these he  really is not a big fan of the compression wraps that we have been utilizing although they have been of great benefit for him. Nonetheless at this point he does want to switch to the compression stockings. In my opinion as long as he is continuing with compression I'm okay with the stockings or the wraps. 06/27/17-he is here in follow-up evaluation for multiple ulcerations to his bilateral toes. There is some improvement in appearance. He is compliant in wearing his compression stockings with significant improvement in lower extremity edema. He saw Dr Ola Spurr on 3/8, per his  notes they would redraw ESR and CRP; plan to continue antibiotic therapy if these remain elevated. I do not see an ESR or CRP level in Epic. The patient continues to take antibiotics. 07/11/17-He is here in follow-up evaluation for multiple ulcerations to multiple toes bilaterally. He presents with complete epithelialization to the right third toe; there has been no deterioration. He continues on antibiotic therapy. He will follow-up next week 07/18/17-He is here in follow-up evaluation for multiple ulcerations to multiple toes bilaterally. He continues to make improvement. He continues on antibiotic therapy. He states he has been using something additional to our orders, he does not elaborate but states he will bring it in next week. 07/25/17-He is here in follow up evaluation for multiple ulcerations to bilateral toes. He is stable. He has completed antibiotic therapy. He admits to "filing" his toes after showers each evening, this is what he was referencing last week; he does not filing for the wounds. We will switch to [facility] and monitor for any improvement, he will follow-up next week 08/01/17-He is here in follow-up evaluation. He admits to "picking" at his toes after cleansing yesterday, leading to new areas of tissue loss on the bilateral second toe. There is improvement noted to the bilateral great toe. We will dress toes today and hope that they maintain until Monday where he will come in for a nurse visit. He has been advised, multiple times with expressed verbalization, to change the dressings to silvercel if the dressings get wet prior to [facility] appointment. STOKES, [name] ([id number]) 08/08/17-He is here in follow-up evaluation for bilateral first and second toe ulcerations. There is significant improvement to all ulcerations since last visit. We will switch to [facility] to all wounds and he will continue with nurse visits on a Monday/Thursday schedule and follow-up with me in  2 weeks. He continues to smoke, 1-3 cigarettes per day, and has been encouraged to not smoke until his next follow-up in 2 weeks. 08/15/17 on evaluation today patient's ulcers on his toes actually appear to be doing fairly well the right to ulcers may be a little bit more moist compared to the left I'm not really sure exactly why as the openings appear to be very small and I'm not seeing any evidence of anything significant as far as you lightest or otherwise. Nonetheless this may just be a small setback he's been doing very well with the [facility] Dressing 08/22/17-He is here in follow-up evaluation for ulcerations to his bilateral first and second toes, there is small/scant amount of drainage noted on today's dressing. He continues with Hydrofera Blue. He continues to smoke, 1 cigarette a day. Voices no complaint or concerns, compliant and compression therapy today. He'll follow-up next week 08/29/17-He is here in follow-up evaluation for ulcerations to his bilateral first and second toes, with a new wound to the left third toe. He states he cut himself while cutting  the toenail. He presents today with more maceration and increased measurements; waxing and waning measurements and moisture has been an ongoing issue. He continues to smoke a proximally 1 cigarette per day, but states he has not smoked since Sunday. We will initiate medihoney daily and evaluate next week. He presents today without compression stockings 09/05/17-He is here in follow-up evaluation for ulcerations to bilateral first and second and left third toe. He did not pick up the medihoney and therefore has not been changing his dressing. He admits to inconsistent where of compression stockings, admits to pain to his bilateral lower extremities with unilateral edema (right greater than left). The ulcerations to multiple toes are more macerated and larger in size than last week. He is wearing compression therapy today. He admits to  an overall feeling of weakness and fatigue and has been encouraged to contact his PCP for evaluation; he has a known history of anemia and intraabdominal lymphadenopathy. We will return to silvercel and he will follow up next week 09/19/17-He is here in follow-up evaluation for ulcerations to bilateral first and second toe.he was unable to make last week's appointment. The culture that was obtained on 5/30 grew clindamycin sensitive MRSA; he was initiated on 6/4 but did not start it until 6/7. There is significant improvement in all wounds, healing to left second and third toe. He also admits to having no alcohol or smoking for the last 6 days. We will continue with same treatment plan I will extend the clindamycin for an additional 10 days and he will follow-up next week. 09/26/17- He is here in follow evaluation for multiple ulcerations to multiple toes bilaterally. He continues to be non-compliant with dressing, compression therapy. He states he is taking the antibiotics as prescribed, although his recall is inconsistent. There is noted deterioration in all ulcers. We will continue same treatment plan, antibiotic therapy and he will follow up next week. 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 This information is obtained from the medical record, as he cannot articulate specifics to his diagnosis or treatment plan. 10/17/17-He is here in follow-up evaluation for multiple ulcerations to multiple toes bilaterally. There is chronic waxing and waning improvement/deterioration. On today's exam they appear improved, dry without maceration. He continues to apply a variety of topical agents, stating that he has switched between the blue product and silver product. In my opinion, this will be a chronic waxing and waning giving his  intermittent compliance. He will follow-up in 2 weeks. As it relates to the biopsy taken from his buttock, he has not followed up with that and has not started radiation therapy. 10/31/17-He is here for evaluation for multiple ulcerations to multiple toes bilaterally. Continues with waxing and waning improvement versus deterioration. They are stable on today's exam without significant maceration. 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 We will continue with every 2 week follow-up appointments 11/14/17- He is seen in follow-up evaluation for multiple ulcerations to multiple bilateral toes. He is scheduled for excision of basal cell carcinoma to his right buttock on Monday 8/12. He continues to have chronic waxing and waning of his ulcers; today is a stable day with minimal drainage and left ear is better than right toes. I will recount to infectious disease regarding chronic suppressive therapy. He admits to being  more compliant with compression stockings, although he is not wearing any today. He has been encouraged to continue with dressing changes as ordered and compression therapy daily. He will be seen in 2 weeks 11/28/17-He is seen in follow up evaluation for multiple ulcerations to multiple toes bilaterally. He had an overnight admission at [facility] (8/11-8/12) for symptomatic anemia, received two units prbc. His surgical date has been moved to 9/94 excision of basal cell carcinoma to the right buttock. He has been noncompliant in wearing compression stockings, applying topical treatment as ordered; he has been applying a "cream", Hydrofera Blue, silvercel. He is currently on no antibiotic therapy; we will send for re-referral to ID for suppressive therapy given his multiple co-morbidities, poor surgical candidate, unwilling to have toe amputation and general non-adherence. Electronic Signature(s) Signed: 11/28/2017 9:45:43 AM By: Lawanda Cousins Entered By:  Lawanda Cousins on 11/28/2017 09:45:43 Owensby, [name] ([id number]) Kari, [name] ([id number]) -------------------------------------------------------------------------------- Physician Orders Details Patient Name: Torok, Izak E. Date of Service: 11/28/2017 9:00 AM Medical Record Number: [id number] Patient Account Number: [id number] Date of Birth/Sex: 07/08/1950 (67 y.o. M) Treating RN: Montey Hora Primary Care Provider: Lamonte Sakai Other Clinician: Referring Provider: Lamonte Sakai Treating Provider/Extender: Cathie Olden in Treatment: 54 Verbal / Phone Orders: No Diagnosis Coding Wound Cleansing Wound #1 Right Toe Great o Clean wound with Normal Saline. Wound #14 Left Toe Second o Clean wound with Normal Saline. Wound #16 Right,Plantar Toe Great o Clean wound with Normal Saline. Wound #2 Right Toe Second o Clean wound with Normal Saline. Wound #5 Left Toe Great o Clean wound with Normal Saline. Anesthetic (add to Medication List) Wound #1 Right Toe Great o Topical Lidocaine 4% cream applied to wound bed prior to debridement (In Clinic Only). Wound #14 Left Toe Second o Topical Lidocaine 4% cream applied to wound bed prior to debridement (In Clinic Only). Wound #16 Right,Plantar Toe Great o Topical Lidocaine 4% cream applied to wound bed prior to debridement (In Clinic Only). Wound #2 Right Toe Second o Topical Lidocaine 4% cream applied to wound bed prior to debridement (In Clinic Only). Wound #5 Left Toe Great o Topical Lidocaine 4% cream applied to wound bed prior to debridement (In Clinic Only). Primary Wound Dressing Wound #1 Right Toe Great o Silver Alginate Wound #14 Left Toe Second o Silver Alginate Wound #16 Right,Plantar Toe Great o Silver Alginate Wound #2 Right Toe Second o Silver Alginate Grave, Jack E. ([id number]) Wound #5 Left Toe Great o Silver Alginate Secondary Dressing Wound #1 Right Toe Great o ABD  pad o Dry Gauze o Conform/Kerlix Wound #14 Left Toe Second o ABD pad o Dry Gauze o Conform/Kerlix Wound #16 Right,Plantar Toe Great o ABD pad o Dry Gauze o Conform/Kerlix Wound #2 Right Toe Second o ABD pad o Dry Gauze o Conform/Kerlix Wound #5 Left Toe Great o ABD pad o Dry Gauze o Conform/Kerlix Dressing Change Frequency Wound #1 Right Toe Great o Change dressing every day. Wound #14 Left Toe Second o Change dressing every day. Wound #16 Right,Plantar Toe Great o Change dressing every day. Wound #2 Right Toe Second o Change dressing every day. Wound #5 Left Toe Great o Change dressing every day. Follow-up Appointments Wound #1 Right Toe Great o Return Appointment in 2 weeks. Wound #14 Left Toe Second o Return Appointment in 2 weeks. Wound #16 Right,Plantar Toe Great o Return Appointment in 2 weeks. JAREL, CUADRA ([id number]) Wound #2 Right Toe Second o Return Appointment in 2 weeks.  Wound #5 Left Toe Great o Return Appointment in 2 weeks. Edema Control Wound #1 Right Toe Great o Patient to wear own compression stockings Wound #14 Left Toe Second o Patient to wear own compression stockings Wound #16 Right,Plantar Toe Great o Patient to wear own compression stockings Wound #2 Right Toe Second o Patient to wear own compression stockings Wound #5 Left Toe Great o Patient to wear own compression stockings Additional Orders / Instructions Wound #1 Right Toe Great o Stop Smoking o Increase protein intake. Wound #14 Left Toe Second o Stop Smoking o Increase protein intake. Wound #16 Right,Plantar Toe Great o Stop Smoking o Increase protein intake. Wound #2 Right Toe Second o Stop Smoking o Increase protein intake. Wound #5 Left Toe Great o Stop Smoking o Increase protein intake. Consults o Infectious Disease Electronic Signature(s) Signed: 11/29/2017 3:54:45 PM By:  Alric Quan Signed: 12/10/2017 5:47:54 PM By: Lawanda Cousins Entered By: Alric Quan on 11/29/2017 12:44:57 Mortensen, [name] ([id number]) -------------------------------------------------------------------------------- Problem List Details Patient Name: Minion, Cristal E. Date of Service: 11/28/2017 9:00 AM Medical Record Number: [id number] Patient Account Number: [id number] Date of Birth/Sex: October 31, 1950 (67 y.o. M) Treating RN: Ahmed Prima Primary Care Provider: Lamonte Sakai Other Clinician: Referring Provider: Lamonte Sakai Treating Provider/Extender: Cathie Olden in Treatment: 10 Active Problems ICD-10 Evaluated Encounter Code Description Active Date Today Diagnosis L97.521 Non-pressure chronic ulcer of other part of left foot limited to 09/21/2016 No Yes breakdown of skin L97.511 Non-pressure chronic ulcer of other part of right foot limited to 09/21/2016 No Yes breakdown of skin E11.621 Type 2 diabetes mellitus with foot ulcer 09/21/2016 No Yes I87.323 Chronic venous hypertension (idiopathic) with inflammation of 09/21/2016 No Yes bilateral lower extremity I89.0 Lymphedema, not elsewhere classified 09/21/2016 No Yes F17.218 Nicotine dependence, cigarettes, with other nicotine-induced 09/21/2016 No Yes disorders F10.19 Alcohol abuse with unspecified alcohol-induced disorder 09/21/2016 No Yes M86.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, right ankle and foot 02/21/2017 No Yes Inactive Problems Resolved Problems Electronic Signature(s) YASUO, PHIMMASONE ([id number]) Signed: 11/28/2017 9:38:16 AM By: Lawanda Cousins Entered By: Lawanda Cousins on 11/28/2017 09:38:16 Boulos, [name] ([id number]) -------------------------------------------------------------------------------- Progress Note Details Patient Name: [name], [name] Date of Service: 11/28/2017 9:00 AM Medical Record Number: [id number] Patient Account Number: [id number] Date of Birth/Sex: May 19, 1950 (67 y.o. M) Treating RN:  Ahmed Prima Primary Care Provider: Lamonte Sakai Other Clinician: Referring Provider: Lamonte Sakai Treating Provider/Extender: Cathie Olden in Treatment: 57 Subjective Chief Complaint Information obtained from Patient Patient presents for treatment of an open diabetic ulcer to both feet History of Present Illness (HPI) The following HPI elements were documented for the patient's wound: Location: bilateral feet ulceration on the toes Quality: Patient reports experiencing a dull pain to affected area(s). Severity: Patient states wound are getting better Duration: Patient has had the wound for > 3 months prior to seeking treatment at the wound center Timing: Pain in wound is constant (hurts all the time) Context: The wound would happen gradually Modifying Factors: Other treatment(s) tried include:treatment for lymphedema and is seen by the podiatrist Dr. Caryl Comes Associated Signs and Symptoms: Patient reports having increase swelling. 67 year old patient here to see as for bilateral feet ulceration to on his left first and second toe and 2 on his right first and second toe, which she's had for about 4 months. 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 Donella Stade.  The patient is also undergoing workup by medical oncology for a intra-abdominal lymphadenopathy. 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 He is also status post appendectomy, inguinal lymph node biopsy, rectal biopsy and rectal examination under anesthesia. he currently smokes cigarettes about half packet a day. In March of this year he was seen by Dr. Hortencia Pilar, for evaluation of bilateral varicose veins and besides wearing compression stockings he had recommended laser  ablation of the right and left great saphenous veins to eleviate the symptoms and complications of severe superficial venous reflux disease. He also recommended lymphedema pumps for better control of his lymphedema. The patient recently has had on 08/23/2016, right greater saphenous vein ablation with the laser energy Earlier lower extremity venous reflux examination done on 05/08/2016 showed no DVT or SVT both lower legs but incompetence of bilateral great saphenous veins was present. A lower arterial study was also done and there was no significant right lower and left lower extremity problems based on a normal toe brachial index bilaterally and the ABI was 1.21 the left and 1.23 on the right. His post ablation venous duplex examination showed successful ablation of the right GS vein with thrombus formation 2 below the right saphenofemoral junction. The deep system was patent without evidence of thrombosis and this was done on 08/30/2016. 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 Addendum: regarding his x-rays done today and x-ray of the left foot -- IMPRESSION: No objective evidence of osteomyelitis. There are soft tissue changes which may reflect cellulitis. X-ray of the right foot -- IMPRESSION:Findings compatible with cellulitis of the toes. No objective evidence of osteomyelitis is observed. 10/01/16 on evaluation today patient's wounds appeared to be doing some better. I did review the x-rays as well which showed no evidence of osteomyelitis although there was evidence on x-ray of cellulitis. He fortunately is not having any discomfort [name], [name] ([id number]) although he continues to have some swelling. He does not remember being on any antibiotics recently. 10/15/16 on evaluation today patient's wounds overall appear to be doing better although he does have a new location  noted on the left foot. Fortunately he is not having significant pain. It almost has the appearance that something is rubbing on the end of his toes but he wears the open toe shoes and according to what he is telling me never wears anything that would rub on his foot. There is no evidence of infection and specifically no evidence of a fungal infection 10/22/16 On evaluation today patient's wounds appeared to be doing better compared to last week in regard to his bilateral lower extremities. Fortunately I happy with how things are progressing although he still has ulcers I feel like that he is improving and appropriate manner. 11/12/16 on evaluation today patient appears to be doing well in regard to his bilateral feet and the respective wounds. We have been using surrounding her dressings along with an antifungal cream which seems to be doing very well. He has no bilateral dysfunction noticed that the rituals are weight loss at this point. He also has no nausea or vomiting a note purulent discharge. He did see Vein and vascular today and he tells me that they told him he could have surgery for his venous stasis but they did not feel like it  was worth it in his words. Fortunately patient's wounds do appear to be getting sneakily better. 11/26/2016 -- he says he is going to have some surgery during this week at [facility] for possibly a colon resection. 12/31/2016 -- the patient has been noncompliant with his smoking and I'm not sure whether he is also started drinking again. He continues to be very nonchalant about his care 01/14/2017 -- the patient's HandP has been reviewed well and I understand he is being compliant with trying to give up smoking and his local dressing changes. He does not have any surgical options of 4 to him by his vascular surgeons.he was last seen in early August by Dr. Hortencia Pilar who recommended compression stockings,and possibly lymph pumps in 2-3 months after doing a  review ultrasound. 01/28/2017 - the patient did not have any fresh complaints but on examination I noted a large lacerated wound on the plantar aspect of his right fourth toe which had a lot of necrotic debris and it probes down to bone. 02/07/2017 -- x-ray of the right foot -- IMPRESSION: Soft tissue swelling about the first through fourth toes consistent with cellulitis. New destructive change in the tuft of the distal phalanx of the great toe is consistent with osteomyelitis. 02/14/2017 -- the patient's MRI is pending this coming Monday and he still continues to smoke. We have again gone over off loading of his wounds in great detail and he says he's been compliant. 02/21/2017 -- MR of the right foot -- IMPRESSION: 1. Soft tissue ulcer at the tip of the first, second and third toe knows. Cortical irregularity and bone marrow edema in the first distal phalanx most concerning for osteomyelitis. Mild marrow edema in the second and third distal phalanx without definite cortical destruction which may reflect early osteomyelitis versus reactive marrow edema. 2. Soft tissue edema surrounding the first phalanx most consistent with cellulitis. the patient was also recently evaluated by his medical oncologist Dr. 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 She is treating him with weekly Procrit. She is also keeping intra-abdominal lymphadenopathy and right lower lobe lung nodule under observation. 04/04/2017 -- he was seen by Dr. Adrian Prows on 03/25/2017 -- after review he empirically put him on ciprofloxacin and doxycycline as they have good bone penetration and good bioavailability and it will cover the usual pathogens and diabetic foot osteomyelitis. He will check inflammatory markers and plan a 45-26 week old records. C-reactive protein was 0.3 and the ESR was 72 04/18/17 on evaluation today patient appears to be doing about  the same in regard to his lower extremity wounds bilaterally. He has continued to use the antifungal cream which does seem to be beneficial. Nonetheless the ulcers do seem to in some areas be epithelial eyes over and in other areas are still open. He is having no significant discomfort. 04/25/17-he is here in follow-up evaluation for multiple ulcerations to multiple toes bilaterally. He states he did see Dr. Ola Spurr again last week and continues antibiotic therapy. He is voicing no complaints or concerns, will continue with current treatment plan will possibility of adding compression therapy next week after an additional week of treatment/lotions to BLE prescribed by Dr Ola Spurr 05/02/17 he is here in follow up for for multiple ulcers to multiple toes bilaterally. we will stop using antifungal cream and will continue with silvercel and follow up next week [name], [name] ([id number]) 05/09/17-he is here in follow-up  for multiple ulcerations to multiple toes bilaterally. There is improvement in appearance. He has not completely stopped using antifungal cream, but admits he has not using it between the toes. He has an appointment with Dr. Ola Spurr on 2/11, continues on doxycycline and Cipro. It has been 5 weeks of antibiotic therapy, we will order plain film xray to evaluate for osteomyelitis next week, prior to follow up with ID. Will continue with silvercel and follow up next week 05/16/17-he is here in follow-up evaluation for multiple ulcerations to multiple toes bilaterally and new wound to the right posterior heel. There is essentially no change in appearance, deteriorating measurements; he has a history of waxing and waning measurements. He admits that he continues to apply moisturizer/cream/ointment to his toes despite weekly reminders to only apply silvercel to his toes. He states that he thinks the surgical shoe contributed to the superficial ulcer to his posterior heel, he is unable to  articulate if this was an area of dry cracked skin as he has a similar area to the left heel. He now is wearing open toed slippers. He has an appointment with Dr. Ola Spurr on 2/11. We have ordered x-rays for her bilateral feet; he was advised to obtain the x-rays today or tomorrow. He will follow-up next week 05/23/17-he is here in follow-up evaluation for multiple ulcerations to multiple toes bilaterally and the right posterior heel. There is improvement in maceration. He has been compliant and not applying any moisturizing agent to his toes. He has been using Lac-Hydrin for his lower extremities with improvement. He did not go to his appointment on Monday with Dr. Ola Spurr secondary to financial concerns. X-rays for her bilateral feet showed: LEFT FOOT with slight erosion of the tuft of the distal phalanges of the left first and second toe suspicious for osteomyelitis, RIGHT FOOT with 1.erosion of the tufts of the distal phalanges of the right first second and possibly third toes consistent with osteomyelitis, 2 no definitive abnormality of the calcaneus is seen on the images obtained, 3. Plantar calcaneal degenerative spur. We briefly discussed hyperbaric adjunctive therapy for treatment of chronic refractory osteomyelitis. I do not find an a1c in EMR, will contact PCP for record, or order if needed. He has been encouraged to contact Dr Ola Spurr office regarding the follow-up appointment, encouraged him to inquire about payment plan. We will continue with same treatment plan and follow-up next week. He states he is still taking antibiotics and has "a lot" left. He states he has been taking them as directed, 2 pills twice daily. According to Dr. Blane Ohara office notes he was originally started on 12/17 for 4 weeks and extended on 1/14 for an additional 4 weeks. He should be done with his antibiotic therapy, he was advised to bring his bottles and to his next appointment, we will  contact pharmacy. 05/30/17-he is here in follow-up evaluation for multiple ulcerations to multiple toes bilaterally and the right posterior heel. He is accompanied by his brother-in-law. Wounds are stable. He has yet to make up with Dr. Ola Spurr. We contacted his PCP, with no record of recent A1c we will draw an A1c. His brother-in-law states that he was taken off all of his diabetic medication secondary to kidney function. 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 The antibiotics were counted and have approximately 2 weeks left, although they should be complete. The pill organizer reveals missing days. We discussed the need  for consistent medications, to have optimal benefit of medication. He has a cousin that lives with him and he will ask her to check his organizer daily. He has been advised to follow up with Dr Ola Spurr, and will go by the office today. He has been advised to quit smoking. 06/06/17-he is here in follow up evaluation. He has had to make an appointment with Dr. Ola Spurr. He did have blood work obtained, a1c 5. He continues to take antibiotic therapy. Significant improvement in bilateral lower extremity edema with compression therapy. Essentially no change in ulcerations to toes. He states he is "going to try something different" and "let me know next week" if it works; he would not provide any additional information and was encouraged to follow our orders. We will follow up next week 06/13/17-he is here in follow-up evaluation. He has an appointment with Dr. Ola Spurr tomorrow morning. He states he purchased an ointment from [facility] and applied to his toes for 3 days, he does not remember the name of the ointment. There is improvement to his wounds, minimal maceration. He continues to take antibiotic therapy, this should have been completed last month. His brother-in-law who  regularly accompanies his appointments was asked to take the bottles to the appointment tomorrow with Dr. Ola Spurr so he is aware. We will continue with 3 layer compression, and order OPEN TOE compression 20-30mmHg; we will apply compression stockings next week. He continues to smoke, smoked "2 cigarettes" last week 06/20/17 on evaluation today patient did receive his compression stockings which he has with him today for both lower extremities. With that being said he tells me at this point in time that he is very happy to have these he really is not a big fan of the compression wraps that we have been utilizing although they have been of great benefit for him. Nonetheless at this point he does want to switch to the compression stockings. In my opinion as long as he is continuing with compression I'm okay with the stockings or the wraps. 06/27/17-he is here in follow-up evaluation for multiple ulcerations to his bilateral toes. There is some improvement in appearance. He is compliant in wearing his compression stockings with significant improvement in lower extremity edema. He saw Dr Ola Spurr on 3/8, per his notes they would redraw ESR and CRP; plan to continue antibiotic therapy if these remain elevated. I do not see an ESR or CRP level in Epic. The patient continues to take antibiotics. 07/11/17-He is here in follow-up evaluation for multiple ulcerations to multiple toes bilaterally. He presents with complete epithelialization to the right third toe; there has been no deterioration. He continues on antibiotic therapy. He will follow-up next week 07/18/17-He is here in follow-up evaluation for multiple ulcerations to multiple toes bilaterally. He continues to make improvement. He continues on antibiotic therapy. He states he has been using something additional to our orders, he does [name], [name] ([id number]) not elaborate but states he will bring it in next week. 07/25/17-He is here in follow up  evaluation for multiple ulcerations to bilateral toes. He is stable. He has completed antibiotic therapy. He admits to "filing" his toes after showers each evening, this is what he was referencing last week; he does not filing for the wounds. We will switch to [facility] and monitor for any improvement, he will follow-up next week 08/01/17-He is here in follow-up evaluation. He admits to "picking" at his toes after cleansing yesterday, leading to new areas of tissue loss on  the bilateral second toe. There is improvement noted to the bilateral great toe. We will dress toes today and hope that they maintain until Monday where he will come in for a nurse visit. He has been advised, multiple times with expressed verbalization, to change the dressings to silvercel if the dressings get wet prior to [facility] appointment. 08/08/17-He is here in follow-up evaluation for bilateral first and second toe ulcerations. There is significant improvement to all ulcerations since last visit. We will switch to [facility] to all wounds and he will continue with nurse visits on a Monday/Thursday schedule and follow-up with me in 2 weeks. He continues to smoke, 1-3 cigarettes per day, and has been encouraged to not smoke until his next follow-up in 2 weeks. 08/15/17 on evaluation today patient's ulcers on his toes actually appear to be doing fairly well the right to ulcers may be a little bit more moist compared to the left I'm not really sure exactly why as the openings appear to be very small and I'm not seeing any evidence of anything significant as far as you lightest or otherwise. Nonetheless this may just be a small setback he's been doing very well with the [facility] Dressing 08/22/17-He is here in follow-up evaluation for ulcerations to his bilateral first and second toes, there is small/scant amount of drainage noted on today's dressing. He continues with Hydrofera Blue. He continues to smoke, 1 cigarette a  day. Voices no complaint or concerns, compliant and compression therapy today. He'll follow-up next week 08/29/17-He is here in follow-up evaluation for ulcerations to his bilateral first and second toes, with a new wound to the left third toe. He states he cut himself while cutting the toenail. He presents today with more maceration and increased measurements; waxing and waning measurements and moisture has been an ongoing issue. He continues to smoke a proximally 1 cigarette per day, but states he has not smoked since Sunday. We will initiate medihoney daily and evaluate next week. He presents today without compression stockings 09/05/17-He is here in follow-up evaluation for ulcerations to bilateral first and second and left third toe. He did not pick up the medihoney and therefore has not been changing his dressing. He admits to inconsistent where of compression stockings, admits to pain to his bilateral lower extremities with unilateral edema (right greater than left). The ulcerations to multiple toes are more macerated and larger in size than last week. He is wearing compression therapy today. He admits to an overall feeling of weakness and fatigue and has been encouraged to contact his PCP for evaluation; he has a known history of anemia and intraabdominal lymphadenopathy. We will return to silvercel and he will follow up next week 09/19/17-He is here in follow-up evaluation for ulcerations to bilateral first and second toe.he was unable to make last week's appointment. The culture that was obtained on 5/30 grew clindamycin sensitive MRSA; he was initiated on 6/4 but did not start it until 6/7. There is significant improvement in all wounds, healing to left second and third toe. He also admits to having no alcohol or smoking for the last 6 days. We will continue with same treatment plan I will extend the clindamycin for an additional 10 days and he will follow-up next week. 09/26/17- He is here  in follow evaluation for multiple ulcerations to multiple toes bilaterally. He continues to be non-compliant with dressing, compression therapy. He states he is taking the antibiotics as prescribed, although his recall is inconsistent. There  is noted deterioration in all ulcers. We will continue same treatment plan, antibiotic therapy and he will follow up next week. 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 This information is obtained from the medical record, as he cannot articulate specifics to his diagnosis or treatment plan. 10/17/17-He is here in follow-up evaluation for multiple ulcerations to multiple toes bilaterally. There is chronic waxing and waning improvement/deterioration. On today's exam they appear improved, dry without maceration. He continues to apply a variety of topical agents, stating that he has switched between the blue product and silver product. In my opinion, this will be a chronic waxing and waning giving his intermittent compliance. He will follow-up in 2 weeks. As it relates to the biopsy taken from his buttock, he has not followed up with that and has not started radiation therapy. 10/31/17-He is here for evaluation for multiple ulcerations to multiple toes bilaterally. Continues with waxing and waning improvement versus deterioration. They are stable on today's exam without significant maceration. 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 We will continue with every 2 week follow-up appointments 11/14/17- He is seen in follow-up evaluation for multiple ulcerations to multiple bilateral toes. He is scheduled for excision of basal cell carcinoma to his right buttock on Monday 8/12. He continues to have chronic waxing and waning of his ulcers; today is a stable day with  minimal drainage and left ear is better than right toes. I will recount to infectious disease regarding chronic suppressive therapy. He admits to being more compliant with compression stockings, although he is not wearing any today. He has been encouraged to continue with dressing changes as ordered and compression therapy daily. He will be seen in 2 weeks 11/28/17-He is seen in follow up evaluation for multiple ulcerations to multiple toes bilaterally. He had an overnight admission at [facility] (8/11-8/12) for symptomatic anemia, received two units prbc. His surgical date has been moved to 9/94 excision of basal cell carcinoma to the right buttock. He has been noncompliant in wearing compression stockings, applying topical treatment as ordered; he has been applying a "cream", Hydrofera Blue, silvercel. He is currently on no antibiotic [name], [name] ([id number]) therapy; we will send for re-referral to ID for suppressive therapy given his multiple co-morbidities, poor surgical candidate, unwilling to have toe amputation and general non-adherence. Objective Constitutional Vitals Time Taken: 9:14 AM, Height: 69 in, Weight: 168 lbs, BMI: 24.8, Temperature: 98.0 F, Pulse: 57 bpm, Respiratory Rate: 16 breaths/min, Blood Pressure: 168/77 mmHg. Integumentary (Hair, Skin) Wound #1 status is Open. Original cause of wound was Gradually Appeared. The wound is located on the Right Toe Great. The wound measures 0.8cm length x 2.1cm width x 0.1cm depth; 1.319cm^2 area and 0.132cm^3 volume. There is Fat Layer (Subcutaneous Tissue) Exposed exposed. There is no tunneling or undermining noted. There is a small amount of serous drainage noted. The wound margin is flat and intact. There is large (67-100%) pink granulation within the wound bed. There is no necrotic tissue within the wound bed. The periwound skin appearance did not exhibit: Callus, Crepitus, Excoriation, Induration, Rash, Scarring,  Dry/Scaly, Maceration, Atrophie Blanche, Cyanosis, Ecchymosis, Hemosiderin Staining, Mottled, Pallor, Rubor, Erythema. Periwound temperature was noted as No Abnormality. Wound #14 status is Open. Original cause of wound was Gradually Appeared. The wound is located  on the Left Toe Second. The wound measures 1cm length x 1.4cm width x 0.1cm depth; 1.1cm^2 area and 0.11cm^3 volume. There is no tunneling or undermining noted. There is a medium amount of serous drainage noted. The wound margin is flat and intact. There is large (67-100%) pink granulation within the wound bed. There is no necrotic tissue within the wound bed. The periwound skin appearance did not exhibit: Callus, Crepitus, Excoriation, Induration, Rash, Scarring, Dry/Scaly, Maceration, Atrophie Blanche, Cyanosis, Ecchymosis, Hemosiderin Staining, Mottled, Pallor, Rubor, Erythema. Periwound temperature was noted as No Abnormality. Wound #16 status is Open. Original cause of wound was Not Known. The wound is located on the [employer]. The wound measures 0.5cm length x 1.9cm width x 0.1cm depth; 0.746cm^2 area and 0.075cm^3 volume. There is Fat Layer (Subcutaneous Tissue) Exposed exposed. There is no tunneling or undermining noted. There is a medium amount of serous drainage noted. The wound margin is flat and intact. There is large (67-100%) pink granulation within the wound bed. There is no necrotic tissue within the wound bed. The periwound skin appearance did not exhibit: Callus, Crepitus, Excoriation, Induration, Rash, Scarring, Dry/Scaly, Maceration, Atrophie Blanche, Cyanosis, Ecchymosis, Hemosiderin Staining, Mottled, Pallor, Rubor, Erythema. Periwound temperature was noted as No Abnormality. Wound #2 status is Open. Original cause of wound was Gradually Appeared. The wound is located on the Right Toe Second. The wound measures 0.8cm length x 2.1cm width x 0.1cm depth; 1.319cm^2 area and 0.132cm^3 volume. There is  no tunneling or undermining noted. There is a medium amount of serous drainage noted. The wound margin is flat and intact. There is large (67-100%) pink granulation within the wound bed. There is no necrotic tissue within the wound bed. The periwound skin appearance did not exhibit: Callus, Crepitus, Excoriation, Induration, Rash, Scarring, Dry/Scaly, Maceration, Atrophie Blanche, Cyanosis, Ecchymosis, Hemosiderin Staining, Mottled, Pallor, Rubor, Erythema. Periwound temperature was noted as No Abnormality. Wound #5 status is Open. Original cause of wound was Gradually Appeared. The wound is located on the Left Toe Great. The wound measures 0.3cm length x 0.2cm width x 0.1cm depth; 0.047cm^2 area and 0.005cm^3 volume. There is Fat Layer (Subcutaneous Tissue) Exposed exposed. There is no tunneling or undermining noted. There is a medium amount of serous drainage noted. The wound margin is flat and intact. There is large (67-100%) pink granulation within the wound bed. There is no necrotic tissue within the wound bed. The periwound skin appearance did not exhibit: Callus, Crepitus, Excoriation, Induration, Rash, Scarring, Dry/Scaly, Maceration, Atrophie Blanche, Cyanosis, Ecchymosis, Hemosiderin Staining, Mottled, Pallor, Rubor, Erythema. Periwound temperature was noted as No Abnormality. 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 Wound #14 Left Toe Second: Clean wound with Normal Saline. Wound #16 Right,Plantar Toe Great: Clean wound with Normal Saline. Wound #2 Right Toe Second: Clean wound with Normal Saline. Wound #5 Left Toe Great: Clean wound with Normal Saline. Anesthetic (add to Medication List): Wound #1 Right Toe Great: Topical Lidocaine 4% cream applied to wound bed prior to debridement (In Clinic Only).  Wound #14 Left Toe Second: Topical Lidocaine 4% cream applied to wound bed prior to debridement (In Clinic Only). Wound #16 Right,Plantar Toe Great: Topical Lidocaine 4% cream applied to wound bed prior to debridement (In Clinic Only). Wound #2 Right Toe Second: Topical Lidocaine 4% cream applied to wound bed prior to debridement (In Clinic Only). Wound #5 Left Toe Great: Topical Lidocaine 4% cream applied to wound bed prior to debridement (In Clinic Only). Primary Wound Dressing: Wound #1 Right Toe Great: Silver Alginate Wound #14 Left Toe Second: Silver Alginate Wound #16 Right,Plantar Toe Great: Silver Alginate Wound #2 Right Toe Second: Silver Alginate Wound #5 Left Toe Great: Silver Alginate Secondary Dressing: Wound #1 Right Toe GreatOCTAVE, MONTROSE ([id number]) ABD pad Dry Gauze Conform/Kerlix Wound #14 Left Toe Second: ABD pad Dry Gauze Conform/Kerlix Wound #16 Right,Plantar Toe Great: ABD pad Dry Gauze Conform/Kerlix Wound #2 Right Toe Second: ABD pad Dry Gauze Conform/Kerlix Wound #5 Left Toe Great: ABD pad Dry Gauze Conform/Kerlix Dressing Change Frequency: Wound #1 Right Toe Great: Change dressing every day. Wound #14 Left Toe Second: Change dressing every day. Wound #16 Right,Plantar Toe Great: Change dressing every day. Wound #2 Right Toe Second: Change dressing every day. Wound #5 Left Toe Great: Change dressing every day. Follow-up Appointments: Wound #1 Right Toe Great: Return Appointment in 2  weeks. Wound #14 Left Toe Second: Return Appointment in 2 weeks. Wound #16 Right,Plantar Toe Great: Return Appointment in 2 weeks. Wound #2 Right Toe Second: Return Appointment in 2 weeks. Wound #5 Left Toe Great: Return Appointment in 2 weeks. Edema Control: Wound #1 Right Toe Great: Patient to wear own compression stockings Wound #14 Left Toe Second: Patient to wear own compression stockings Wound #16 Right,Plantar Toe Great: Patient to wear own compression stockings Wound #2 Right Toe Second: Patient to wear own compression stockings Wound #5 Left Toe Great: Patient to wear own compression stockings Additional Orders / Instructions: Wound #1 Right Toe Great: Stop Smoking Increase protein intake. Wound #14 Left Toe Second: Stop Smoking Increase protein intake. Wound #16 Right,Plantar Toe GreatKEPLER, MCCABE. ([id number]) Stop Smoking Increase protein intake. Wound #2 Right Toe Second: Stop Smoking Increase protein intake. Wound #5 Left Toe Great: Stop Smoking Increase protein intake. Electronic Signature(s) Signed: 11/28/2017 9:46:09 AM By: Lawanda Cousins Entered By: Lawanda Cousins on 11/28/2017 09:46:09 Hupp, [name] ([id number]) -------------------------------------------------------------------------------- SuperBill Details Patient Name: Delashmit, Sanuel E. Date of Service: 11/28/2017 Medical Record Number: [id number] Patient Account Number: [id number] Date of Birth/Sex: 08/10/1950 (67 y.o. 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 of skin E11.621 Type 2 diabetes mellitus with foot ulcer I87.323 Chronic  venous hypertension (idiopathic) with inflammation of bilateral lower extremity I89.0 Lymphedema, not elsewhere classified F17.218 Nicotine dependence, cigarettes, 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 abuse 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, right ankle and foot Facility Procedures CPT4 Code: [id number] Description: 03212 - WOUND CARE VISIT-LEV 5 EST PT Modifier: Quantity: 1 Physician Procedures CPT4 Code Description: 2482500 [id number] - WC PHYS LEVEL 3 - EST PT ICD-10 Diagnosis Description L97.521 Non-pressure chronic ulcer of other part of left foot limited t L97.511 Non-pressure chronic ulcer of other part of right foot limited E11.621 Type  2  diabetes mellitus with foot ulcer Modifier: o breakdown of s to breakdown of Quantity: 1 kin skin Electronic Signature(s) Signed: 11/28/2017 9:46:24 AM By: Lawanda Cousins Entered By: Lawanda Cousins on 11/28/2017 09:46:24

## 2017-12-12 ENCOUNTER — Inpatient Hospital Stay: Payer: Medicare HMO | Attending: Oncology | Admitting: Oncology

## 2017-12-12 ENCOUNTER — Inpatient Hospital Stay: Payer: Medicare HMO

## 2017-12-12 ENCOUNTER — Other Ambulatory Visit: Payer: Self-pay

## 2017-12-12 ENCOUNTER — Encounter: Payer: Self-pay | Admitting: Oncology

## 2017-12-12 ENCOUNTER — Ambulatory Visit: Payer: Medicare HMO | Admitting: Radiation Oncology

## 2017-12-12 VITALS — BP 158/84 | HR 58 | Temp 96.1°F | Wt 184.3 lb

## 2017-12-12 DIAGNOSIS — D472 Monoclonal gammopathy: Secondary | ICD-10-CM | POA: Diagnosis present

## 2017-12-12 DIAGNOSIS — D631 Anemia in chronic kidney disease: Secondary | ICD-10-CM | POA: Diagnosis not present

## 2017-12-12 DIAGNOSIS — I509 Heart failure, unspecified: Secondary | ICD-10-CM | POA: Diagnosis not present

## 2017-12-12 DIAGNOSIS — K7460 Unspecified cirrhosis of liver: Secondary | ICD-10-CM | POA: Diagnosis not present

## 2017-12-12 DIAGNOSIS — R590 Localized enlarged lymph nodes: Secondary | ICD-10-CM | POA: Insufficient documentation

## 2017-12-12 DIAGNOSIS — I130 Hypertensive heart and chronic kidney disease with heart failure and stage 1 through stage 4 chronic kidney disease, or unspecified chronic kidney disease: Secondary | ICD-10-CM | POA: Insufficient documentation

## 2017-12-12 DIAGNOSIS — R188 Other ascites: Secondary | ICD-10-CM | POA: Diagnosis not present

## 2017-12-12 DIAGNOSIS — Z79899 Other long term (current) drug therapy: Secondary | ICD-10-CM

## 2017-12-12 DIAGNOSIS — J90 Pleural effusion, not elsewhere classified: Secondary | ICD-10-CM | POA: Diagnosis not present

## 2017-12-12 DIAGNOSIS — F17210 Nicotine dependence, cigarettes, uncomplicated: Secondary | ICD-10-CM | POA: Diagnosis not present

## 2017-12-12 DIAGNOSIS — K219 Gastro-esophageal reflux disease without esophagitis: Secondary | ICD-10-CM | POA: Diagnosis not present

## 2017-12-12 DIAGNOSIS — D509 Iron deficiency anemia, unspecified: Secondary | ICD-10-CM

## 2017-12-12 DIAGNOSIS — Z923 Personal history of irradiation: Secondary | ICD-10-CM | POA: Diagnosis not present

## 2017-12-12 DIAGNOSIS — Z85828 Personal history of other malignant neoplasm of skin: Secondary | ICD-10-CM

## 2017-12-12 DIAGNOSIS — K2970 Gastritis, unspecified, without bleeding: Secondary | ICD-10-CM | POA: Diagnosis not present

## 2017-12-12 DIAGNOSIS — J449 Chronic obstructive pulmonary disease, unspecified: Secondary | ICD-10-CM | POA: Diagnosis not present

## 2017-12-12 DIAGNOSIS — N189 Chronic kidney disease, unspecified: Secondary | ICD-10-CM | POA: Insufficient documentation

## 2017-12-12 DIAGNOSIS — E1122 Type 2 diabetes mellitus with diabetic chronic kidney disease: Secondary | ICD-10-CM | POA: Insufficient documentation

## 2017-12-12 LAB — IRON AND TIBC
IRON: 63 ug/dL (ref 45–182)
SATURATION RATIOS: 23 % (ref 17.9–39.5)
TIBC: 271 ug/dL (ref 250–450)
UIBC: 209 ug/dL

## 2017-12-12 LAB — CBC
HCT: 29.5 % — ABNORMAL LOW (ref 40.0–52.0)
Hemoglobin: 9.8 g/dL — ABNORMAL LOW (ref 13.0–18.0)
MCH: 28.7 pg (ref 26.0–34.0)
MCHC: 33.3 g/dL (ref 32.0–36.0)
MCV: 86.2 fL (ref 80.0–100.0)
Platelets: 207 10*3/uL (ref 150–440)
RBC: 3.43 MIL/uL — ABNORMAL LOW (ref 4.40–5.90)
RDW: 13.8 % (ref 11.5–14.5)
WBC: 5.8 10*3/uL (ref 3.8–10.6)

## 2017-12-12 LAB — VITAMIN B12: Vitamin B-12: 361 pg/mL (ref 180–914)

## 2017-12-12 LAB — FERRITIN: Ferritin: 66 ng/mL (ref 24–336)

## 2017-12-12 LAB — FOLATE: FOLATE: 11.6 ng/mL (ref >5.9)

## 2017-12-12 MED ORDER — NON FORMULARY: 50000.0000 [IU] | Status: DC

## 2017-12-12 MED ORDER — EPOETIN ALFA-EPBX 10000 UNIT/ML IJ SOLN
10000.0000 [IU] | Freq: Once | INTRAMUSCULAR | Status: AC
  Administered 2017-12-12: 10000 [IU] via SUBCUTANEOUS
  Filled 2017-12-12: qty 1

## 2017-12-12 MED ORDER — EPOETIN ALFA-EPBX 40000 UNIT/ML IJ SOLN
40000.0000 [IU] | Freq: Once | INTRAMUSCULAR | Status: AC
  Administered 2017-12-12: 40000 [IU] via SUBCUTANEOUS
  Filled 2017-12-12: qty 1

## 2017-12-12 NOTE — Progress Notes (Signed)
Hematology/Oncology Consult note [facility]  [phone] [phone]  Patient Care Team: [name], MD as PCP - General (Internal Medicine)   Name of the patient: [name]  [id number]  11-17-1950   Date of visit: 12/12/17  Diagnosis- 1.Anemia- multifactorial- iron deficiency, chronic disease and anemia of kidney disease  2. 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. 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 Baruch Gouty. Patient had CT abdomen in July 2016 which showed retroperitoneal, celiac and gastric adenopathy. MR pelvis in Oct 2017 showed b/l inguinal and external iliac adenopathy as well. Inguinal LN biopsy was negative for malignancy.   2. Biopsy of the skin lesion showed: DIAGNOSIS:  A. PERIANAL POLYP, LEFT; EXCISION:  - SKIN WITH POLYPOID VASCULAR PROLIFERATION WITH SUPERFICIAL NECROSIS,  SEE COMMENT.   B. PERIANAL MASS, RIGHT; INCISIONAL BIOPSY:  - INVASIVE CARCINOMA WITH BASALOID FEATURES.  - SEE COMMENT.   Comment #1:  The differential diagnosis for the perianal polyp includes pyogenic  granuloma and ulcerated hemangioma.   Comment #2:  Immunohistochemical stains were performed. The carcinoma is positive for  p16, BerEp4, and BCL-2. Stain controls worked appropriately.  Unfortunately the pattern of staining does not assist with further  classification of the carcinoma. Clinical correlation is required to  determine if the lesion involves perianal skin primarily (BCC) or is  arising in the anal canal (squamous cell carcinoma with basaloid  features).   This was followed by RT to the lesion.  3. He had repeat CT abdomen in feb 2018 which showed: IMPRESSION: Progressive  hepatic cirrhosis since prior study. No evidence of hepatic neoplasm.  New moderate ascites, diffuse mesenteric and body wall edema, and small bilateral pleural effusions. Mild diffuse small bowel wall thickening, consistent with hypoalbuminemia.  Findings consistent with portal venous hypertension, including mild splenomegaly and upper abdominal venous collaterals.  Stable mild abdominal lymphadenopathy. Mild increase in bilateral iliac and inguinal lymphadenopathy in the pelvis, which may be reactive in etiology, with metastatic disease considered less Likely.  4. Heunderwentegd and colonoscopy for his microcytic anemiawhich showed no overt bleeding lesions or malignancy  5. CBC on 09/18/2016 showed H&H of 6.5/19.8 and MCV of 86.7. Labs suggestive of iron deficiency anemia. B12 and folate was within normal limits. Multiple myeloma panel showed IGG monoclonal protein of 0.9 gm. Polyclonal gammopathyHaptoglobin and no copper was normal. Reticulocyte count was mildly elevated at 7.9. Patient received 2 doses of ferriheme on 620 and 10/03/2016. He also received 1 unit of blood transfusion  6. EGD showed a normal duodenum and normal esophagus. Found to have gastritis which was biopsied. Also found to have a large polypoid mass in the left piriform sinus and nonobstructing the airway. This was subsequently evaluated by ENT which did not find any evidence of mass. The colonoscopy showed appendixthat was appearing protruding and ordered this blood with stool. No mucus seen in the vicinity. He was evaluated by a [facility] and has been considered for a repeat colonoscopy versus endoscopy mucosal resection of the mucocele  7.Bonemarrowbiopsy showed hypercellular marrow for age. 9% plasma cells. Clonality of cells could not be evaluated because of lack of clot sections available for staining. Cytogenetics were normal. FISH studies were  negative for any chromosomal abnormalities as  well.  8. Patient received procrit for anemia of chronic kidney disease with improvement of his anemia to 10  Interval history-patient is scheduled for gluteus mass excision surgery on 12/16/2017 at the hospital for atypical chest pain and was found to have a hemoglobin of 7 and received blood transfusion.  He feels well today.  Appetite is good and he denies any unintentional weight loss  ECOG PS- 1 Pain scale- 0 Opioid associated constipation- no  Review of systems- Review of Systems  Constitutional: Positive for malaise/fatigue. Negative for chills, fever and weight loss.  HENT: Negative for congestion, ear discharge and nosebleeds.   Eyes: Negative for blurred vision.  Respiratory: Negative for cough, hemoptysis, sputum production, shortness of breath and wheezing.   Cardiovascular: Negative for chest pain, palpitations, orthopnea and claudication.  Gastrointestinal: Negative for abdominal pain, blood in stool, constipation, diarrhea, heartburn, melena, nausea and vomiting.  Genitourinary: Negative for dysuria, flank pain, frequency, hematuria and urgency.  Musculoskeletal: Negative for back pain, joint pain and myalgias.  Skin: Negative for rash.  Neurological: Negative for dizziness, tingling, focal weakness, seizures, weakness and headaches.  Endo/Heme/Allergies: Does not bruise/bleed easily.  Psychiatric/Behavioral: Negative for depression and suicidal ideas. The patient does not have insomnia.       No Known Allergies   Past Medical History:  Diagnosis Date  . Anemia   . Cancer ([place]) 11/02/2014   PERIANAL MASS, RIGHT; INCISIONAL BIOPSY: INVASIVE CARCINOMA WITH BASALOID FEATURES  . CHF (congestive heart failure) ([place])   . Chronic kidney disease   . COPD (chronic obstructive pulmonary disease) ([place])    NO INHALERS  . Diabetes mellitus    NO MEDS  . GERD (gastroesophageal reflux disease)   . Hepatic cirrhosis ([place])   . Hypertension   . Leg swelling   . Loose, teeth      PATIENT STATES "HAS 6 TEETH, SOME ARE LOOSE"  . Neuromuscular disorder ([place])    RIGHT HAND AND FINGERS NUMB  . Shortness of breath dyspnea    WITH EXERTION   . 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 02/24/2016     Past Surgical History:  Procedure Laterality Date  . APPENDECTOMY    . COLONOSCOPY WITH PROPOFOL N/A 11/19/2014   Procedure: COLONOSCOPY WITH PROPOFOL;  Surgeon: Lucilla Lame, MD;  Location: [place];  Service: Endoscopy;  Laterality: N/A;  DIABETIC-ORAL MEDS  . COLONOSCOPY WITH PROPOFOL N/A 10/04/2016   Procedure: COLONOSCOPY WITH PROPOFOL;  Surgeon: Jonathon Bellows, MD;  Location[facility]COPY;  Service: Endoscopy;  Laterality: N/A;  . ESOPHAGOGASTRODUODENOSCOPY (EGD) WITH PROPOFOL N/A 10/04/2016   Procedure: ESOPHAGOGASTRODUODENOSCOPY (EGD) WITH PROPOFOL;  Surgeon: Jonathon Bellows, MD;  Location: [facility] ENDOSCOPY;  Service: Endoscopy;  Laterality: N/A;  . GIVENS CAPSULE STUDY N/A 11/13/2016   Procedure: GIVENS CAPSULE STUDY;  Surgeon: Jonathon Bellows, MD;  Location: [facility] ENDOSCOPY;  Service: Gastroenterology;  Laterality: N/A;  . INGUINAL LYMPH NODE BIOPSY Right 01/26/2016   Procedure: INGUINAL LYMPH NODE BIOPSY;  Surgeon: Clayburn Pert, MD;  Location: [facility] ORS;  Service: General;  Laterality: Right;  . RECTAL BIOPSY N/A 11/02/2014   Procedure: BIOPSY RECTAL;  Surgeon: Marlyce Huge, MD;  Location: [facility] ORS;  Service: General;  Laterality: N/A;  . RECTAL EXAM UNDER ANESTHESIA N/A 11/02/2014   Procedure: RECTAL EXAM UNDER ANESTHESIA;  Surgeon: Marlyce Huge, MD;  Location: [facility] ORS;  Service: General;  Laterality: N/A;  . TONSILLECTOMY      Social History   Socioeconomic History  .  Marital status: Divorced    Spouse name: Not on file  . Number of children: Not on file  . Years of education: Not on file  . Highest education level: Not on file  Occupational History  . Not on file  Social Needs  . Financial resource strain: Somewhat hard  . Food  insecurity:    Worry: Patient refused    Inability: Patient refused  . Transportation needs:    Medical: Patient refused    Non-medical: Patient refused  Tobacco Use  . Smoking status: Current Every Day Smoker    Packs/day: 0.50    Years: 20.00    Pack years: 10.00    Types: Cigarettes  . Smokeless tobacco: Never Used  . Tobacco comment: 1 PACK EVERY 2 DAYS  Substance and Sexual Activity  . Alcohol use: Not Currently  . Drug use: Yes    Types: Marijuana    Comment: Last Use 2016 per patient-+ UDS FOR COCAINE IN 2014  . Sexual activity: Never  Lifestyle  . Physical activity:    Days per week: 0 days    Minutes per session: 0 min  . Stress: To some extent  Relationships  . Social connections:    Talks on phone: More than three times a week    Gets together: More than three times a week    Attends religious service: More than 4 times per year    Active member of club or organization: Yes    Attends meetings of clubs or organizations: More than 4 times per year    Relationship status: Divorced  . Intimate partner violence:    Fear of current or ex partner: Patient refused    Emotionally abused: Patient refused    Physically abused: Patient refused    Forced sexual activity: Patient refused  Other Topics Concern  . Not on file  Social History Narrative  . Not on file    Family History  Problem Relation Age of Onset  . Asthma Mother   . Colon cancer Neg Hx      Current Outpatient Medications:  .  amLODipine (NORVASC) 10 MG tablet, Take 10 mg by mouth every morning. , Disp: , Rfl:  .  atorvastatin (LIPITOR) 40 MG tablet, Take 40 mg by mouth every morning. , Disp: , Rfl:  .  bismuth subsalicylate (PEPTO BISMOL) 262 MG/15ML suspension, Take 30 mLs by mouth every 6 (six) hours as needed for indigestion or diarrhea or loose stools. , Disp: , Rfl:  .  chlorthalidone (HYGROTON) 25 MG tablet, Take 25 mg by mouth 2 (two) times daily. , Disp: , Rfl:  .  ferrous sulfate 325 (65  FE) MG tablet, Take 325 mg by mouth daily. , Disp: , Rfl:  .  hydrALAZINE (APRESOLINE) 25 MG tablet, Take 25 mg 2 (two) times daily by mouth., Disp: , Rfl:  .  omeprazole (PRILOSEC) 40 MG capsule, Take 40 mg by mouth every morning. , Disp: , Rfl:  .  polyethylene glycol (MIRALAX / GLYCOLAX) packet, Take 17 g by mouth daily as needed for mild constipation. , Disp: , Rfl:  .  vitamin B-12 (CYANOCOBALAMIN) 1000 MCG tablet, Take 1 tablet (1,000 mcg total) by mouth daily., Disp: 30 tablet, Rfl: 3 .  Vitamin D, Ergocalciferol, (DRISDOL) 50000 units CAPS capsule, Take 50,000 Units by mouth every Tuesday. , Disp: , Rfl:  No current facility-administered medications for this visit.   Facility-Administered Medications Ordered in Other Visits:  .  Darbepoetin Alfa (ARANESP)  injection 100 mcg, 100 mcg, Subcutaneous, Q30 days, Sindy Guadeloupe, MD, 100 mcg at 07/25/17 0924 .  Darbepoetin Alfa (ARANESP) injection 100 mcg, 100 mcg, Subcutaneous, Q30 days, Sindy Guadeloupe, MD, 100 mcg at 08/22/17 1015 .  epoetin alfa (EPOGEN,PROCRIT) injection 40,000 Units, 40,000 Units, Subcutaneous, Weekly, Sindy Guadeloupe, MD, 40,000 Units at 05/23/17 1145  Physical exam:  Vitals:   12/12/17 0956  BP: (!) 158/84  Pulse: (!) 58  Temp: (!) 96.1 F (35.6 C)  TempSrc: Tympanic  Weight: 184 lb 4.8 oz (83.6 kg)   Physical Exam  Constitutional: He is oriented to person, place, and time.  HENT:  Head: Normocephalic and atraumatic.  Eyes: Pupils are equal, round, and reactive to light. EOM are normal.  Neck: Normal range of motion.  Cardiovascular: Normal rate, regular rhythm and normal heart sounds.  Pulmonary/Chest: Effort normal and breath sounds normal.  Abdominal: Soft. Bowel sounds are normal.  Musculoskeletal: He exhibits edema (trace b/l).  Neurological: He is alert and oriented to person, place, and time.  Skin: Skin is warm and dry.     CMP Latest Ref Rng & Units 11/16/2017  Glucose 70 - 99 mg/dL 159(H)  BUN 8  - 23 mg/dL 28(H)  Creatinine 0.61 - 1.24 mg/dL 2.04(H)  Sodium 135 - 145 mmol/L 136  Potassium 3.5 - 5.1 mmol/L 4.1  Chloride 98 - 111 mmol/L 109  CO2 22 - 32 mmol/L 21(L)  Calcium 8.9 - 10.3 mg/dL 8.4(L)  Total Protein 6.5 - 8.1 g/dL -  Total Bilirubin 0.3 - 1.2 mg/dL -  Alkaline Phos 38 - 126 U/L -  AST 15 - 41 U/L -  ALT 0 - 44 U/L -   CBC Latest Ref Rng & Units 12/12/2017  WBC 3.8 - 10.6 K/uL 5.8  Hemoglobin 13.0 - 18.0 g/dL 9.8(L)  Hematocrit 40.0 - 52.0 % 29.5(L)  Platelets 150 - 440 K/uL 207    No images are attached to the encounter.  Dg Chest 2 View  Result Date: 11/16/2017 CLINICAL DATA:  Acute onset of intermittent generalized chest pain. EXAM: CHEST - 2 VIEW COMPARISON:  Chest radiograph performed 01/23/2016 FINDINGS: The lungs are well-aerated. Vascular congestion is noted. Mild left basilar atelectasis or scarring is noted, stable from 2017. There is no evidence of pleural effusion or pneumothorax. The heart is normal in size; the mediastinal contour is within normal limits. No acute osseous abnormalities are seen. IMPRESSION: Vascular congestion noted; mild left basilar atelectasis or scarring noted, stable from 2017. Electronically Signed   By: Garald Balding M.D.   On: 11/16/2017 21:33     Assessment and plan- Patient is a 67 y.o. male with anemia of chronic kidney disease  Hb today after after recent blood transfusion is 9.8. Iron stores are normal. He will proceed with monthly retacrit today as hb ,10. He will continue retacrit at month 1 and 2 and I will see him back in 3 months with cbc, ferritin and iron studies.  MGUs- stable mgus labs in July 2019. Will repeat next month   Visit Diagnosis 1. MGUS (monoclonal gammopathy of unknown significance)   2. Anemia in chronic kidney disease, unspecified CKD stage      Dr. Randa Evens, MD, MPH [facility] at [facility] [phone] 12/12/2017 12:01 PM

## 2017-12-15 MED ORDER — CEFAZOLIN SODIUM-DEXTROSE 2-4 GM/100ML-% IV SOLN: 2.0000 g | INTRAVENOUS | Status: DC

## 2017-12-16 ENCOUNTER — Ambulatory Visit: Payer: Medicare HMO | Admitting: Anesthesiology

## 2017-12-16 ENCOUNTER — Ambulatory Visit
Admission: RE | Admit: 2017-12-16 | Discharge: 2017-12-16 | Disposition: A | Discharge status: Home / Self Care | Payer: Medicare HMO | Source: Ambulatory Visit | Attending: General Surgery | Admitting: General Surgery

## 2017-12-16 ENCOUNTER — Encounter
Admission: RE | Disposition: A | Discharge status: Home / Self Care | Payer: Self-pay | Source: Ambulatory Visit | Attending: General Surgery

## 2017-12-16 ENCOUNTER — Ambulatory Visit: Payer: Medicare HMO | Admitting: Internal Medicine

## 2017-12-16 ENCOUNTER — Encounter: Payer: Self-pay | Admitting: Emergency Medicine

## 2017-12-16 DIAGNOSIS — K7460 Unspecified cirrhosis of liver: Secondary | ICD-10-CM | POA: Diagnosis not present

## 2017-12-16 DIAGNOSIS — K219 Gastro-esophageal reflux disease without esophagitis: Secondary | ICD-10-CM | POA: Insufficient documentation

## 2017-12-16 DIAGNOSIS — I509 Heart failure, unspecified: Secondary | ICD-10-CM | POA: Diagnosis not present

## 2017-12-16 DIAGNOSIS — J449 Chronic obstructive pulmonary disease, unspecified: Secondary | ICD-10-CM | POA: Diagnosis not present

## 2017-12-16 DIAGNOSIS — Z79899 Other long term (current) drug therapy: Secondary | ICD-10-CM | POA: Diagnosis not present

## 2017-12-16 DIAGNOSIS — E1122 Type 2 diabetes mellitus with diabetic chronic kidney disease: Secondary | ICD-10-CM | POA: Insufficient documentation

## 2017-12-16 DIAGNOSIS — I130 Hypertensive heart and chronic kidney disease with heart failure and stage 1 through stage 4 chronic kidney disease, or unspecified chronic kidney disease: Secondary | ICD-10-CM | POA: Insufficient documentation

## 2017-12-16 DIAGNOSIS — N189 Chronic kidney disease, unspecified: Secondary | ICD-10-CM | POA: Diagnosis not present

## 2017-12-16 DIAGNOSIS — C44519 Basal cell carcinoma of skin of other part of trunk: Secondary | ICD-10-CM | POA: Insufficient documentation

## 2017-12-16 DIAGNOSIS — K6289 Other specified diseases of anus and rectum: Secondary | ICD-10-CM

## 2017-12-16 DIAGNOSIS — F17210 Nicotine dependence, cigarettes, uncomplicated: Secondary | ICD-10-CM | POA: Diagnosis not present

## 2017-12-16 DIAGNOSIS — M7989 Other specified soft tissue disorders: Secondary | ICD-10-CM | POA: Diagnosis not present

## 2017-12-16 HISTORY — PX: INCISION AND DRAINAGE PERIRECTAL ABSCESS: SHX1804

## 2017-12-16 LAB — URINE DRUG SCREEN, QUALITATIVE (ARMC ONLY)
Amphetamines, Ur Screen: NOT DETECTED
Barbiturates, Ur Screen: NOT DETECTED
Benzodiazepine, Ur Scrn: NOT DETECTED
CANNABINOID 50 NG, UR ~~LOC~~: NOT DETECTED
Cocaine Metabolite,Ur ~~LOC~~: NOT DETECTED
MDMA (ECSTASY) UR SCREEN: NOT DETECTED
Methadone Scn, Ur: NOT DETECTED
Opiate, Ur Screen: NOT DETECTED
PHENCYCLIDINE (PCP) UR S: NOT DETECTED
TRICYCLIC, UR SCREEN: NOT DETECTED

## 2017-12-16 LAB — GLUCOSE, CAPILLARY
GLUCOSE-CAPILLARY: 146 mg/dL — AB (ref 70–99)
GLUCOSE-CAPILLARY: 154 mg/dL — AB (ref 70–99)

## 2017-12-16 SURGERY — INCISION AND DRAINAGE, ABSCESS, PERIRECTAL
Anesthesia: General | Laterality: Right | Wound class: Clean Contaminated

## 2017-12-16 MED ORDER — GABAPENTIN 300 MG PO CAPS
300.0000 mg | ORAL_CAPSULE | ORAL | Status: AC
  Administered 2017-12-16: 300 mg via ORAL

## 2017-12-16 MED ORDER — LACTATED RINGERS IV SOLN
INTRAVENOUS | Status: DC
  Administered 2017-12-16: 12:00:00 via INTRAVENOUS

## 2017-12-16 MED ORDER — ONDANSETRON HCL 4 MG/2ML IJ SOLN
INTRAMUSCULAR | Status: DC | PRN
  Administered 2017-12-16: 4 mg via INTRAVENOUS

## 2017-12-16 MED ORDER — BUPIVACAINE HCL (PF) 0.5 % IJ SOLN
INTRAMUSCULAR | Status: AC
  Filled 2017-12-16: qty 30

## 2017-12-16 MED ORDER — MIDAZOLAM HCL 2 MG/2ML IJ SOLN
INTRAMUSCULAR | Status: AC
  Filled 2017-12-16: qty 2

## 2017-12-16 MED ORDER — FENTANYL CITRATE (PF) 100 MCG/2ML IJ SOLN
INTRAMUSCULAR | Status: AC
  Filled 2017-12-16: qty 2

## 2017-12-16 MED ORDER — LACTATED RINGERS IV SOLN
INTRAVENOUS | Status: DC | PRN
  Administered 2017-12-16: 14:00:00 via INTRAVENOUS

## 2017-12-16 MED ORDER — FENTANYL CITRATE (PF) 100 MCG/2ML IJ SOLN
INTRAMUSCULAR | Status: DC | PRN
  Administered 2017-12-16 (×2): 50 ug via INTRAVENOUS

## 2017-12-16 MED ORDER — FENTANYL CITRATE (PF) 100 MCG/2ML IJ SOLN: 25.0000 ug | INTRAMUSCULAR | Status: DC | PRN

## 2017-12-16 MED ORDER — LIDOCAINE HCL (CARDIAC) PF 100 MG/5ML IV SOSY
PREFILLED_SYRINGE | INTRAVENOUS | Status: DC | PRN
  Administered 2017-12-16: 80 mg via INTRAVENOUS

## 2017-12-16 MED ORDER — ROCURONIUM BROMIDE 100 MG/10ML IV SOLN
INTRAVENOUS | Status: DC | PRN
  Administered 2017-12-16: 50 mg via INTRAVENOUS

## 2017-12-16 MED ORDER — HYDROCODONE-ACETAMINOPHEN 5-325 MG PO TABS: 1.0000 | ORAL_TABLET | ORAL | 0 refills | Status: DC | PRN

## 2017-12-16 MED ORDER — CEFAZOLIN SODIUM-DEXTROSE 2-4 GM/100ML-% IV SOLN
INTRAVENOUS | Status: AC
  Filled 2017-12-16: qty 100

## 2017-12-16 MED ORDER — PHENYLEPHRINE HCL 10 MG/ML IJ SOLN
INTRAMUSCULAR | Status: DC | PRN
  Administered 2017-12-16 (×2): 100 ug via INTRAVENOUS

## 2017-12-16 MED ORDER — GABAPENTIN 300 MG PO CAPS
ORAL_CAPSULE | ORAL | Status: AC
  Administered 2017-12-16: 300 mg via ORAL
  Filled 2017-12-16: qty 1

## 2017-12-16 MED ORDER — ALBUTEROL SULFATE HFA 108 (90 BASE) MCG/ACT IN AERS
INHALATION_SPRAY | RESPIRATORY_TRACT | Status: DC | PRN
  Administered 2017-12-16: 2 via RESPIRATORY_TRACT

## 2017-12-16 MED ORDER — BUPIVACAINE-EPINEPHRINE (PF) 0.5% -1:200000 IJ SOLN
INTRAMUSCULAR | Status: AC
  Filled 2017-12-16: qty 30

## 2017-12-16 MED ORDER — OXYCODONE HCL 5 MG PO TABS: 5.0000 mg | ORAL_TABLET | Freq: Once | ORAL | Status: DC | PRN

## 2017-12-16 MED ORDER — LABETALOL HCL 5 MG/ML IV SOLN
INTRAVENOUS | Status: DC | PRN
  Administered 2017-12-16: 10 mg via INTRAVENOUS
  Administered 2017-12-16: 5 mg via INTRAVENOUS

## 2017-12-16 MED ORDER — OXYCODONE HCL 5 MG/5ML PO SOLN: 5.0000 mg | Freq: Once | ORAL | Status: DC | PRN

## 2017-12-16 MED ORDER — FAMOTIDINE 20 MG PO TABS
ORAL_TABLET | ORAL | Status: AC
  Filled 2017-12-16: qty 1

## 2017-12-16 MED ORDER — DEXAMETHASONE SODIUM PHOSPHATE 10 MG/ML IJ SOLN
INTRAMUSCULAR | Status: DC | PRN
  Administered 2017-12-16: 5 mg via INTRAVENOUS

## 2017-12-16 MED ORDER — PROPOFOL 10 MG/ML IV BOLUS
INTRAVENOUS | Status: DC | PRN
  Administered 2017-12-16: 100 mg via INTRAVENOUS

## 2017-12-16 MED ORDER — MIDAZOLAM HCL 2 MG/2ML IJ SOLN
INTRAMUSCULAR | Status: DC | PRN
  Administered 2017-12-16: 2 mg via INTRAVENOUS

## 2017-12-16 MED ORDER — SUGAMMADEX SODIUM 500 MG/5ML IV SOLN
INTRAVENOUS | Status: DC | PRN
  Administered 2017-12-16: 100 mg via INTRAVENOUS
  Administered 2017-12-16: 200 mg via INTRAVENOUS

## 2017-12-16 MED ORDER — BUPIVACAINE HCL (PF) 0.5 % IJ SOLN
INTRAMUSCULAR | Status: DC | PRN
  Administered 2017-12-16: 30 mL

## 2017-12-16 MED ORDER — SEVOFLURANE IN SOLN
RESPIRATORY_TRACT | Status: AC
  Filled 2017-12-16: qty 250

## 2017-12-16 SURGICAL SUPPLY — 32 items
BLADE SURG 11 STRL SS SAFETY (MISCELLANEOUS) ×3 IMPLANT
BRIEF STRETCH MATERNITY 2XLG (MISCELLANEOUS) IMPLANT
CANISTER SUCT 1200ML W/VALVE (MISCELLANEOUS) ×3 IMPLANT
DRAIN PENROSE 5/8X18 LTX STRL (WOUND CARE) IMPLANT
DRAPE LAPAROTOMY 100X77 ABD (DRAPES) ×3 IMPLANT
DRAPE LEGGINS SURG 28X43 STRL (DRAPES) IMPLANT
DRAPE UNDER BUTTOCK W/FLU (DRAPES) IMPLANT
ELECT REM PT RETURN 9FT ADLT (ELECTROSURGICAL) ×3
ELECTRODE REM PT RTRN 9FT ADLT (ELECTROSURGICAL) ×1 IMPLANT
GLOVE BIO SURGEON STRL SZ7.5 (GLOVE) ×3 IMPLANT
GLOVE INDICATOR 8.0 STRL GRN (GLOVE) ×3 IMPLANT
GOWN STRL REUS W/ TWL LRG LVL3 (GOWN DISPOSABLE) ×2 IMPLANT
GOWN STRL REUS W/TWL LRG LVL3 (GOWN DISPOSABLE) ×4
KIT TURNOVER KIT A (KITS) ×3 IMPLANT
LABEL OR SOLS (LABEL) ×3 IMPLANT
NEEDLE HYPO 25X1 1.5 SAFETY (NEEDLE) IMPLANT
NS IRRIG 500ML POUR BTL (IV SOLUTION) ×3 IMPLANT
PACK BASIN MINOR ARMC (MISCELLANEOUS) ×3 IMPLANT
PAD OB MATERNITY 4.3X12.25 (PERSONAL CARE ITEMS) IMPLANT
PAD PREP 24X41 OB/GYN DISP (PERSONAL CARE ITEMS) IMPLANT
SOL PREP PVP 2OZ (MISCELLANEOUS) ×3
SOLUTION PREP PVP 2OZ (MISCELLANEOUS) ×1 IMPLANT
SURGILUBE 2OZ TUBE FLIPTOP (MISCELLANEOUS) IMPLANT
SUT SILK 0 SH 30 (SUTURE) ×3 IMPLANT
SUT VIC AB 2-0 CT1 27 (SUTURE) ×2
SUT VIC AB 2-0 CT1 TAPERPNT 27 (SUTURE) ×1 IMPLANT
SUT VIC AB 3-0 SH 27 (SUTURE) ×2
SUT VIC AB 3-0 SH 27X BRD (SUTURE) ×1 IMPLANT
SUT VIC AB 4-0 FS2 27 (SUTURE) ×3 IMPLANT
SWAB CULTURE AMIES ANAERIB BLU (MISCELLANEOUS) IMPLANT
SYR BULB IRRIG 60ML STRL (SYRINGE) IMPLANT
SYR CONTROL 10ML (SYRINGE) IMPLANT

## 2017-12-16 NOTE — Discharge Instructions (Signed)

## 2017-12-16 NOTE — Op Note (Signed)
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  Postoperative diagnosis: Same.  Operative procedure: Excision of basaloid carcinoma, primary closure.  Operating Surgeon: Hervey Ard, MD.  Anesthesia: General endotracheal, Marcaine 0.5%, plain, 30 cc.  Estimated blood loss: Less than 30 cc.  Clinical note: This 67 year old male has previously undergone radiation treatment for an area of basaloid carcinoma in the right gluteal area.  He is developed a recurrence.  No evidence of inguinal adenopathy.  He was admitted for planned excision.  He received Kefzol prior to the procedure.  Today's drug screen was negative for cocaine.  Operative note: With the patient under adequate general endotracheal anesthesia he was rolled to the prone position and carefully padded.  The buttocks were taped apart and the area cleansed with Betadine solution x2 and draped.  Local anesthesia was infiltrated for postoperative comfort.  Epinephrine was not used due to the previous radiation to the area.  The primary was approximately 2.5-3.0 cm in diameter.  It was possible to excise this through an 8 cm long 3.2 cm wide elliptical incision with 5 mm margin superiorly and inferiorly.  It was tagged with a short suture laterally and a long suture on the cephalad aspect.  The skin was incised sharply and the remaining dissection was completed with electrocautery.  The underlying dermis was scarred from previous radiation but the adipose tissue was unremarkable.  Remarkably good cutaneous blood supply.  The wound could be closed primarily with layers of 2-0 Vicryl figure-of-eight sutures to the deep layer followed by interrupted 3-0 Vicryl subcuticular sutures in the medial aspect of the wound and interrupted 4-0 Vicryl subcuticular sutures laterally.  Benzoin Steri-Strips followed by Telfa and Tegaderm dressing were applied.  The patient tolerated the procedure well and was taken to recovery room  in stable condition.

## 2017-12-16 NOTE — Anesthesia Procedure Notes (Signed)
Procedure Name: Intubation Date/Time: 12/16/2017 1:51 PM Performed by: Justus Memory, CRNA Pre-anesthesia Checklist: Patient identified, Patient being monitored, Timeout performed, Emergency Drugs available and Suction available Patient Re-evaluated:Patient Re-evaluated prior to induction Oxygen Delivery Method: Circle system utilized Preoxygenation: Pre-oxygenation with 100% oxygen Induction Type: IV induction Ventilation: Mask ventilation without difficulty Laryngoscope Size: Mac and 3 Grade View: Grade I Tube type: Oral Tube size: 7.5 mm Number of attempts: 1 Airway Equipment and Method: Stylet Placement Confirmation: ETT inserted through vocal cords under direct vision,  positive ETCO2 and breath sounds checked- equal and bilateral Secured at: 21 cm Tube secured with: Tape Dental Injury: Teeth and Oropharynx as per pre-operative assessment

## 2017-12-16 NOTE — Anesthesia Post-op Follow-up Note (Signed)
Anesthesia QCDR form completed.        

## 2017-12-16 NOTE — Transfer of Care (Signed)
Immediate Anesthesia Transfer of Care Note  Patient: [name]  Procedure(s) Performed: EXCISION BASAL CELL CANCER/ GLUTEAL ABSCESS AND FLAP COVERAGE (Right )  Patient Location: PACU  Anesthesia Type:General  Level of Consciousness: sedated  Airway & Oxygen Therapy: Patient Spontanous Breathing and Patient connected to face mask oxygen  Post-op Assessment: Report given to RN and Post -op Vital signs reviewed and stable  Post vital signs: Reviewed and stable  Last Vitals:  Vitals Value Taken Time  BP 167/85 12/16/2017  2:55 PM  Temp 36.3 C 12/16/2017  2:55 PM  Pulse 56 12/16/2017  3:00 PM  Resp 15 12/16/2017  2:58 PM  SpO2 100 % 12/16/2017  3:00 PM  Vitals shown include unvalidated device data.  Last Pain:  Vitals:   12/16/17 1207  TempSrc: Tympanic  PainSc: 0-No pain         Complications: No apparent anesthesia complications

## 2017-12-16 NOTE — Anesthesia Preprocedure Evaluation (Addendum)
Anesthesia Evaluation  Patient identified by MRN, date of birth, ID band Patient awake    Reviewed: Allergy & Precautions, H&P , NPO status , Patient's Chart, lab work & pertinent test results  History of Anesthesia Complications Negative for: history of anesthetic complications  Airway Mallampati: III  TM Distance: >3 FB     Dental  (+) Missing, Chipped, Poor Dentition   Pulmonary neg pulmonary ROS, shortness of breath, COPD, Current Smoker,    breath sounds clear to auscultation       Cardiovascular hypertension, + Peripheral Vascular Disease and +CHF (mild diastolic dysfunction)  (-) Past MI, (-) Cardiac Stents and (-) CABG negative cardio ROS   Rhythm:regular Rate:Normal  June 2018: NORMAL LEFT VENTRICULAR SYSTOLIC FUNCTION WITH MILD LVH NORMAL RIGHT VENTRICULAR SYSTOLIC FUNCTION MILD VALVULAR REGURGITATION (See above) NO VALVULAR STENOSIS MILD to MODERATE AR MILD PHTN EF 60%   Neuro/Psych negative neurological ROS  negative psych ROS   GI/Hepatic negative GI ROS, Neg liver ROS, GERD  Controlled,(+) Hepatitis -, C  Endo/Other  negative endocrine ROSdiabetes  Renal/GU CRFRenal disease     Musculoskeletal   Abdominal   Peds  Hematology negative hematology ROS (+) anemia ,   Anesthesia Other Findings Past Medical History: No date: Anemia 11/02/2014: Cancer ([place])     Comment:  PERIANAL MASS, RIGHT; INCISIONAL BIOPSY: INVASIVE               CARCINOMA WITH BASALOID FEATURES No date: CHF (congestive heart failure) ([facility]) No date: Chronic kidney disease No date: COPD (chronic obstructive pulmonary disease) ([facility])     Comment:  NO INHALERS No date: Diabetes mellitus     Comment:  NO MEDS No date: GERD (gastroesophageal reflux disease) No date: Hepatic cirrhosis ([facility]) No date: Hypertension No date: Leg swelling No date: Loose, teeth     Comment:  PATIENT STATES "HAS 6 TEETH, SOME ARE LOOSE" No date:  Neuromuscular disorder ([facility])     Comment:  RIGHT HAND AND FINGERS NUMB No date: Shortness of breath dyspnea     Comment:  WITH EXERTION  02/24/2016: 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  Past Surgical History: No date: APPENDECTOMY 11/19/2014: COLONOSCOPY WITH PROPOFOL; N/A     Comment:  Procedure: COLONOSCOPY WITH PROPOFOL;  Surgeon: Lucilla Lame, MD;  Location: [place];  Service:               Endoscopy;  Laterality: N/A;  DIABETIC-ORAL MEDS 10/04/2016: COLONOSCOPY WITH PROPOFOL; N/A     Comment:  Procedure: COLONOSCOPY WITH PROPOFOL;  Surgeon: Jonathon Bellows, MD;  Location: [facility] ENDOSCOPY;  Service:               Endoscopy;  Laterality: N/A; 10/04/2016: ESOPHAGOGASTRODUODENOSCOPY (EGD) WITH PROPOFOL; N/A     Comment:  Procedure: ESOPHAGOGASTRODUODENOSCOPY (EGD) WITH               PROPOFOL;  Surgeon: Jonathon Bellows, MD;  Location: [facility]               ENDOSCOPY;  Service: Endoscopy;  Laterality: N/A; 11/13/2016: GIVENS CAPSULE STUDY; N/A     Comment:  Procedure: GIVENS CAPSULE STUDY;  Surgeon: Jonathon Bellows,               MD;  Location[facility]OSCOPY;  Service:  Gastroenterology;  Laterality: N/A; 01/26/2016: INGUINAL LYMPH NODE BIOPSY; Right     Comment:  Procedure: INGUINAL LYMPH NODE BIOPSY;  Surgeon: Clayburn Pert, MD;  Location: [facility] ORS;  Service: General;                Laterality: Right; 11/02/2014: RECTAL BIOPSY; N/A     Comment:  Procedure: BIOPSY RECTAL;  Surgeon: Marlyce Huge, MD;  Location: [facility] ORS;  Service: General;                Laterality: N/A; 11/02/2014: RECTAL EXAM UNDER ANESTHESIA; N/A     Comment:  Procedure: RECTAL EXAM UNDER ANESTHESIA;  Surgeon:               Marlyce Huge, MD;  Location: [facility] ORS;  Service:              General;  Laterality: N/A; No date: TONSILLECTOMY  BMI    Body Mass Index:  28.87 kg/m      Reproductive/Obstetrics negative OB ROS                            Anesthesia Physical Anesthesia Plan  ASA: III  Anesthesia Plan: General ETT   Post-op Pain Management:    Induction:   PONV Risk Score and Plan: 1 and Ondansetron and Dexamethasone  Airway Management Planned:   Additional Equipment:   Intra-op Plan:   Post-operative Plan:   Informed Consent: I have reviewed the patients History and Physical, chart, labs and discussed the procedure including the risks, benefits and alternatives for the proposed anesthesia with the patient or authorized representative who has indicated his/her understanding and acceptance.   Dental Advisory Given  Plan Discussed with: Anesthesiologist, CRNA and Surgeon  Anesthesia Plan Comments:         Anesthesia Quick Evaluation

## 2017-12-16 NOTE — H&P (Signed)
[name] [id number] 10/31/1950     HPI: 67 year old male status post biopsy of a right gluteal basal cell carcinoma with subsequent external beam radiation.  He is developed a recurrent mass with biopsy-proven basal cell carcinoma.  No inguinal adenopathy.  He is admitted for elective excision.  More recently he was admitted for the same procedure but it was canceled due to being positive for cocaine.  Today's urine test is negative for cocaine.  Medications Prior to Admission  Medication Sig Dispense Refill Last Dose  . amLODipine (NORVASC) 10 MG tablet Take 10 mg by mouth every morning.    12/16/2017 at Unknown time  . atorvastatin (LIPITOR) 40 MG tablet Take 40 mg by mouth every morning.    12/16/2017 at Unknown time  . bismuth subsalicylate (PEPTO BISMOL) 262 MG/15ML suspension Take 30 mLs by mouth every 6 (six) hours as needed for indigestion or diarrhea or loose stools.    Taking  . chlorthalidone (HYGROTON) 25 MG tablet Take 25 mg by mouth 2 (two) times daily.    12/16/2017 at Unknown time  . ferrous sulfate 325 (65 FE) MG tablet Take 325 mg by mouth daily.    12/16/2017 at Unknown time  . hydrALAZINE (APRESOLINE) 25 MG tablet Take 25 mg 2 (two) times daily by mouth.   12/16/2017 at Unknown time  . omeprazole (PRILOSEC) 40 MG capsule Take 40 mg by mouth every morning.    12/16/2017 at Unknown time  . polyethylene glycol (MIRALAX / GLYCOLAX) packet Take 17 g by mouth daily as needed for mild constipation.    12/16/2017 at Unknown time  . vitamin B-12 (CYANOCOBALAMIN) 1000 MCG tablet Take 1 tablet (1,000 mcg total) by mouth daily. 30 tablet 3 12/16/2017 at Unknown time  . Vitamin D, Ergocalciferol, (DRISDOL) 50000 units CAPS capsule Take 50,000 Units by mouth every Tuesday.    12/16/2017 at Unknown time   No Known Allergies Past Medical History:  Diagnosis Date  . Anemia   . Cancer ([place]) 11/02/2014   PERIANAL MASS, RIGHT; INCISIONAL BIOPSY: INVASIVE CARCINOMA WITH BASALOID FEATURES  . CHF  (congestive heart failure) ([place])   . Chronic kidney disease   . COPD (chronic obstructive pulmonary disease) ([place])    NO INHALERS  . Diabetes mellitus    NO MEDS  . GERD (gastroesophageal reflux disease)   . Hepatic cirrhosis ([place])   . Hypertension   . Leg swelling   . Loose, teeth    PATIENT STATES "HAS 6 TEETH, SOME ARE LOOSE"  . Neuromuscular disorder ([place])    RIGHT HAND AND FINGERS NUMB  . Shortness of breath dyspnea    WITH EXERTION   . 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 APPENDECTOMY    . COLONOSCOPY WITH PROPOFOL N/A 11/19/2014   Procedure: COLONOSCOPY WITH PROPOFOL;  Surgeon: Lucilla Lame, MD;  Location: [place];  Service: Endoscopy;  Laterality: N/A;  DIABETIC-ORAL MEDS  . COLONOSCOPY WITH PROPOFOL N/A 10/04/2016   Procedure: COLONOSCOPY WITH PROPOFOL;  Surgeon: Jonathon Bellows, MD;  Location: [facility] ENDOSCOPY;  Service: Endoscopy;  Laterality: N/A;  . ESOPHAGOGASTRODUODENOSCOPY (EGD) WITH PROPOFOL N/A 10/04/2016   Procedure: ESOPHAGOGASTRODUODENOSCOPY (EGD) WITH PROPOFOL;  Surgeon: Jonathon Bellows, MD;  Location: [facility] END[facility]COPY;  Service: Endoscopy;  Laterality: N/A;  . GIVENS CAPSULE STUDY N/A 11/13/2016   Procedure: GIVENS CAPSULE STUDY;  Surgeon: Jonathon Bellows, MD;  Location: [facility] ENDOSCOPY;  Service: Gastroenterology;  Laterality: N/A;  .  INGUINAL LYMPH NODE BIOPSY Right 01/26/2016   Procedure: INGUINAL LYMPH NODE BIOPSY;  Surgeon: Clayburn Pert, MD;  Location: [facility] ORS;  Service: General;  Laterality: Right;  . RECTAL BIOPSY N/A 11/02/2014   Procedure: BIOPSY RECTAL;  Surgeon: Marlyce Huge, MD;  Location: [facility] ORS;  Service: General;  Laterality: N/A;  . RECTAL EXAM UNDER ANESTHESIA N/A 11/02/2014   Procedure: RECTAL EXAM UNDER ANESTHESIA;  Surgeon: Marlyce Huge, MD;  Location: [facility] ORS;  Service: General;  Laterality: N/A;  . TONSILLECTOMY     Social History   Socioeconomic History   . Marital status: Divorced    Spouse name: Not on file  . Number of children: Not on file  . Years of education: Not on file  . Highest education level: Not on file  Occupational History  . Not on file  Social Needs  . Financial resource strain: Somewhat hard  . Food insecurity:    Worry: Patient refused    Inability: Patient refused  . Transportation needs:    Medical: Patient refused    Non-medical: Patient refused  Tobacco Use  . Smoking status: Current Every Day Smoker    Packs/day: 0.50    Years: 20.00    Pack years: 10.00    Types: Cigarettes  . Smokeless tobacco: Never Used  . Tobacco comment: 1 PACK EVERY 2 DAYS  Substance and Sexual Activity  . Alcohol use: Not Currently  . Drug use: Yes    Types: Marijuana    Comment: Last Use 2016 per patient-+ UDS FOR COCAINE IN 2014  . Sexual activity: Never  Lifestyle  . Physical activity:    Days per week: 0 days    Minutes per session: 0 min  . Stress: To some extent  Relationships  . Social connections:    Talks on phone: More than three times a week    Gets together: More than three times a week    Attends religious service: More than 4 times per year    Active member of club or organization: Yes    Attends meetings of clubs or organizations: More than 4 times per year    Relationship status: Divorced  . Intimate partner violence:    Fear of current or ex partner: Patient refused    Emotionally abused: Patient refused    Physically abused: Patient refused    Forced sexual activity: Patient refused  Other Topics Concern  . Not on file  Social History Narrative  . Not on file   Social History   Social History Narrative  . Not on file     ROS: Negative.     PE: HEENT: Negative. Lungs: Clear. Cardio: RR. 3+ centimeter right gluteal mass unchanged from prior exams.  Assessment/Plan:  Proceed with planned gluteal mass excision. Forest Gleason Antiono Ettinger 12/16/2017

## 2017-12-17 ENCOUNTER — Encounter: Payer: Self-pay | Admitting: General Surgery

## 2017-12-17 NOTE — Anesthesia Postprocedure Evaluation (Signed)
Anesthesia Post Note  Patient: [name]  Procedure(s) Performed: EXCISION BASAL CELL CANCER/ GLUTEAL ABSCESS AND FLAP COVERAGE (Right )  Patient location during evaluation: PACU Anesthesia Type: General Level of consciousness: awake and alert Pain management: pain level controlled Vital Signs Assessment: post-procedure vital signs reviewed and stable Respiratory status: spontaneous breathing, nonlabored ventilation, respiratory function stable and patient connected to nasal cannula oxygen Cardiovascular status: blood pressure returned to baseline and stable Postop Assessment: no apparent nausea or vomiting Anesthetic complications: no     Last Vitals:  Vitals:   12/16/17 1555 12/16/17 1655  BP: (!) 157/71 (!) 170/73  Pulse: (!) 58 62  Resp: 14 16  Temp: (!) 36.1 C   SpO2: 97% 99%    Last Pain:  Vitals:   12/17/17 1101  TempSrc:   PainSc: 0-No pain                 Martha Clan

## 2017-12-18 LAB — SURGICAL PATHOLOGY

## 2017-12-19 ENCOUNTER — Encounter: Payer: Medicare HMO | Attending: Physician Assistant | Admitting: Physician Assistant

## 2017-12-19 DIAGNOSIS — Z923 Personal history of irradiation: Secondary | ICD-10-CM | POA: Diagnosis not present

## 2017-12-19 DIAGNOSIS — E11621 Type 2 diabetes mellitus with foot ulcer: Secondary | ICD-10-CM | POA: Insufficient documentation

## 2017-12-19 DIAGNOSIS — I87333 Chronic venous hypertension (idiopathic) with ulcer and inflammation of bilateral lower extremity: Secondary | ICD-10-CM | POA: Insufficient documentation

## 2017-12-19 DIAGNOSIS — K7460 Unspecified cirrhosis of liver: Secondary | ICD-10-CM | POA: Insufficient documentation

## 2017-12-19 DIAGNOSIS — I890 Lymphedema, not elsewhere classified: Secondary | ICD-10-CM | POA: Insufficient documentation

## 2017-12-19 DIAGNOSIS — L97521 Non-pressure chronic ulcer of other part of left foot limited to breakdown of skin: Secondary | ICD-10-CM | POA: Insufficient documentation

## 2017-12-19 DIAGNOSIS — M86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, right ankle and foot: Secondary | ICD-10-CM | POA: Insufficient documentation

## 2017-12-19 DIAGNOSIS — F17210 Nicotine dependence, cigarettes, uncomplicated: Secondary | ICD-10-CM | POA: Diagnosis not present

## 2017-12-19 DIAGNOSIS — E1169 Type 2 diabetes mellitus with other specified complication: Secondary | ICD-10-CM | POA: Insufficient documentation

## 2017-12-19 DIAGNOSIS — F1019 Alcohol abuse with unspecified alcohol-induced disorder: Secondary | ICD-10-CM | POA: Diagnosis not present

## 2017-12-19 DIAGNOSIS — L97511 Non-pressure chronic ulcer of other part of right foot limited to breakdown of skin: Secondary | ICD-10-CM | POA: Diagnosis not present

## 2017-12-19 DIAGNOSIS — Z85828 Personal history of other malignant neoplasm of skin: Secondary | ICD-10-CM | POA: Insufficient documentation

## 2017-12-20 ENCOUNTER — Telehealth: Payer: Self-pay | Admitting: *Deleted

## 2017-12-20 NOTE — Telephone Encounter (Signed)
Called patient but was unable to leave message due to voicemail box not set up. I will try the patient again later on today

## 2017-12-20 NOTE — Telephone Encounter (Signed)
-----   Message from Robert Bellow, MD sent at 12/19/2017  7:48 PM EDT ----- These notify the patient that the edges of the tissue were clear.  Follow-up as scheduled. ----- Message ----- From: Interface, Lab In Three Zero One Sent: 12/18/2017   7:46 PM EDT To: Robert Bellow, MD

## 2017-12-23 ENCOUNTER — Ambulatory Visit (INDEPENDENT_AMBULATORY_CARE_PROVIDER_SITE_OTHER): Payer: Medicare HMO | Admitting: Internal Medicine

## 2017-12-23 ENCOUNTER — Encounter: Payer: Self-pay | Admitting: Internal Medicine

## 2017-12-23 VITALS — BP 175/71 | Wt 184.1 lb

## 2017-12-23 DIAGNOSIS — L97512 Non-pressure chronic ulcer of other part of right foot with fat layer exposed: Secondary | ICD-10-CM

## 2017-12-23 NOTE — Progress Notes (Signed)
[facility]      Reason for Consult: foot ulcer    Referring Physician: Lawanda Cousins NP    Patient ID: [name], male    DOB: [birth year], 67 y.o.   MRN: [id number]  HPI:   Here for evaluation for from wound care for "suppressive therapy given his multiple co-morbidities, poor surgical candidate, unwilling to have toe amputation".  He was previously seen by Dr. 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  His medical history is complicated by cirrhosis, CHF, diabetes, recent surgery for squamous cell carcinoma, continued tobacco abuse.  He continues to follow weekly with wound care and getting appropriate treatment.  No previous cultures on bone done that I am aware of.  Wound has remained stable with open ulcers on the right.   Previous record reviewed from Dr. Ola Spurr and wound care as above.   Past Medical History:  Diagnosis Date  . Anemia   . Cancer ([place]) 11/02/2014   PERIANAL MASS, RIGHT; INCISIONAL BIOPSY: INVASIVE CARCINOMA WITH BASALOID FEATURES  . CHF (congestive heart failure) ([place])   . Chronic kidney disease   . COPD (chronic obstructive pulmonary disease) ([place])    NO INHALERS  . Diabetes mellitus    NO MEDS  . GERD (gastroesophageal reflux disease)   . Hepatic cirrhosis ([place])   . Hypertension   . Leg swelling   . Loose, teeth    PATIENT STATES "HAS 6 TEETH, SOME ARE LOOSE"  . Neuromuscular disorder ([place])    RIGHT HAND AND FINGERS NUMB  . Shortness of breath dyspnea    WITH EXERTION   . 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 Authorizing Provider  amLODipine (NORVASC) 10 MG tablet Take 10 mg by mouth every morning.    Yes [provider]  atorvastatin (LIPITOR) 40 MG tablet Take  40 mg by mouth every morning.    Yes [provider]  bismuth subsalicylate (PEPTO BISMOL) 262 MG/15ML suspension Take 30 mLs by mouth every 6 (six) hours as needed for indigestion or diarrhea or loose stools.    Yes [provider]  chlorthalidone (HYGROTON) 25 MG tablet Take 25 mg by mouth 2 (two) times daily.    Yes [provider]  ferrous sulfate 325 (65 FE) MG tablet Take 325 mg by mouth daily.    Yes [provider]  hydrALAZINE (APRESOLINE) 25 MG tablet Take 25 mg 2 (two) times daily by mouth.   Yes [provider]  HYDROcodone-acetaminophen (NORCO/VICODIN) 5-325 MG tablet Take 1 tablet by mouth every 4 (four) hours as needed for moderate pain. 12/16/17 12/16/18 Yes Byrnett, Forest Gleason, MD  omeprazole (PRILOSEC) 40 MG capsule Take 40 mg by mouth every morning.    Yes [provider]  polyethylene glycol (MIRALAX / GLYCOLAX) packet Take 17 g by mouth daily as needed for mild constipation.    Yes [provider]  vitamin B-12 (CYANOCOBALAMIN) 1000 MCG tablet Take 1 tablet (1,000 mcg total) by mouth daily. 06/03/17  Yes Sindy Guadeloupe, MD  Vitamin D, Ergocalciferol, (DRISDOL) 50000 units CAPS capsule Take 50,000 Units by mouth every Tuesday.    Yes [provider]    No Known Allergies  Social History  Tobacco Use  . Smoking status: Current Every Day Smoker    Packs/day: 0.50    Years: 20.00    Pack years: 10.00    Types: Cigarettes  . Smokeless tobacco: Never Used  . Tobacco comment: 1 PACK EVERY 2 DAYS  Substance Use Topics  . Alcohol use: Not Currently  . Drug use: Yes    Types: Marijuana    Comment: Last Use 2016 per patient-+ UDS FOR COCAINE IN 2014    Family History  Problem Relation Age of Onset  . Asthma Mother   . Colon cancer Neg Hx     Review of Systems  Constitutional: negative for fevers and chills Gastrointestinal: negative for nausea and diarrhea All other systems reviewed and are negative      Constitutional: in no apparent distress and alert  Vitals:   12/23/17 0913  BP: (!) 175/71   EYES: anicteric ENMT: no thrush Cardiovascular: Cor RRR Respiratory: CTA B; normal respiratory effort GI: Bowel sounds are normal, liver is not enlarged, spleen is not enlarged Musculoskeletal: pedal edema 1-2 +; left toes with clean, dry healed wounds with no exposed areas.  No drainage.  Right leg with 2 ulcers open to fat, no probing to bone, no significant drainage, no surrounding erythema, no warmth.   Skin: negatives: no rash Hematologic: no cervical lad  Labs: Lab Results  Component Value Date   WBC 5.8 12/12/2017   HGB 9.8 (L) 12/12/2017   HCT 29.5 (L) 12/12/2017   MCV 86.2 12/12/2017   PLT 207 12/12/2017    Lab Results  Component Value Date   CREATININE 2.04 (H) 11/16/2017   BUN 28 (H) 11/16/2017   NA 136 11/16/2017   K 4.1 11/16/2017   CL 109 11/16/2017   CO2 21 (L) 11/16/2017    Lab Results  Component Value Date   ALT 21 11/11/2017   AST 33 11/11/2017   ALKPHOS 68 11/11/2017   BILITOT 0.7 11/11/2017   INR 1.07 11/11/2017     Assessment: diabetic/vascular foot ulcers.  On exam, no signs of current infection.  I discussed with the patient and his brother-in-law antibiotic treatment and indications.  I discussed that there is no role for chronic suppressive therapy of wounds to prevent developing deep infection such as osteomyelitis and my recommendation is only to treat if infection develops.  Continuous exposure to antibiotics leads to more resistance for him and will make future treatment options limited which could lead to an untreatable condition.  Additionally, antibiotic exposure can lead to side effects including serious complications and in the case, with no benefit.  Therefore I have recommended that we continue to watch this off of antibiotics.  He voiced his understanding and is in agreement with the plan.    Plan: 1) observe off of antibiotics.    Thanks  for the referral

## 2017-12-24 ENCOUNTER — Ambulatory Visit (INDEPENDENT_AMBULATORY_CARE_PROVIDER_SITE_OTHER): Payer: Medicare HMO | Admitting: General Surgery

## 2017-12-24 ENCOUNTER — Encounter: Payer: Self-pay | Admitting: General Surgery

## 2017-12-24 VITALS — BP 148/82 | HR 75 | Resp 16 | Ht 67.0 in | Wt 184.0 lb

## 2017-12-24 DIAGNOSIS — C44519 Basal cell carcinoma of skin of other part of trunk: Secondary | ICD-10-CM

## 2017-12-24 NOTE — Progress Notes (Signed)
Patient ID: [name], male   DOB: 1950-12-24, 67 y.o.   MRN: [id number]  Chief Complaint  Patient presents with  . Routine Post Op    HPI [name] is a 67 y.o. male.  Here for postoperative visit, excision right gluteal basel cell cancer on 12-16-17. He is still sore and using the pain medication. He states the drainage is brown.  HPI  Past Medical History:  Diagnosis Date  . Anemia   . Cancer ([place]) 11/02/2014   PERIANAL MASS, RIGHT; INCISIONAL BIOPSY: INVASIVE CARCINOMA WITH BASALOID FEATURES  . CHF (congestive heart failure) ([place])   . Chronic kidney disease   . COPD (chronic obstructive pulmonary disease) ([place])    NO INHALERS  . Diabetes mellitus    NO MEDS  . GERD (gastroesophageal reflux disease)   . Hepatic cirrhosis ([place])   . Hypertension   . Leg swelling   . Loose, teeth    PATIENT STATES "HAS 6 TEETH, SOME ARE LOOSE"  . Neuromuscular disorder ([place])    RIGHT HAND AND FINGERS NUMB  . Shortness of breath dyspnea    WITH EXERTION   . 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 APPENDECTOMY    . COLONOSCOPY WITH PROPOFOL N/A 11/19/2014   Procedure: COLONOSCOPY WITH PROPOFOL;  Surgeon: Lucilla Lame, MD;  Location: [place];  Service: Endoscopy;  Laterality: N/A;  DIABETIC-ORAL MEDS  . COLONOSCOPY WITH PROPOFOL N/A 10/04/2016   Procedure: COLONOSCOPY WITH PROPOFOL;  Surgeon: Jonathon Bellows, MD;  Location: [facility] ENDOSCOPY;  Service: Endoscopy;  Laterality: N/A;  . ESOPHAGOGASTRODUODENOSCOPY (EGD) WITH PROPOFOL N/A 10/04/2016   Procedure: ESOPHAGOGASTRODUODENOSCOPY (EGD) WITH PROPOFOL;  Surgeon: Jonathon Bellows, MD;  Location: [facility] ENDOSCOPY;  Service: Endoscopy;  Laterality: N/A;  . GIVENS CAPSULE STUDY N/A 11/13/2016   Procedure: GIVENS CAPSULE STUDY;  Surgeon: Jonathon Bellows, MD;  Location: [facility] ENDOSCOPY;  Service: Gastroenterology;  Laterality: N/A;  . INCISION AND DRAINAGE PERIRECTAL ABSCESS Right  12/16/2017   Procedure: EXCISION BASAL CELL CANCER/ GLUTEAL ABSCESS AND FLAP COVERAGE;  Surgeon: Robert Bellow, MD;  Location: [facility] ORS;  Service: General;  Laterality: Right;  . INGUINAL LYMPH NODE BIOPSY Right 01/26/2016   Procedure: INGUINAL LYMPH NODE BIOPSY;  Surgeon: Clayburn Pert, MD;  Location: [facility] ORS;  Service: General;  Laterality: Right;  . RECTAL BIOPSY N/A 11/02/2014   Procedure: BIOPSY RECTAL;  Surgeon: Marlyce Huge, MD;  Location: [facility] ORS;  Service: General;  Laterality: N/A;  . RECTAL EXAM UNDER ANESTHESIA N/A 11/02/2014   Procedure: RECTAL EXAM UNDER ANESTHESIA;  Surgeon: Marlyce Huge, MD;  Location: [facility] ORS;  Service: General;  Laterality: N/A;  . TONSILLECTOMY      Family History  Problem Relation Age of Onset  . Asthma Mother   . Colon cancer Neg Hx     Social History Social History   Tobacco Use  . Smoking status: Current Every Day Smoker    Packs/day: 0.50    Years: 20.00    Pack years: 10.00    Types: Cigarettes  . Smokeless tobacco: Never Used  . Tobacco comment: 1 PACK EVERY 2 DAYS  Substance Use Topics  . Alcohol use: Not Currently  . Drug use: Yes    Types: Marijuana    Comment: Last Use 2016 per patient-+ UDS FOR COCAINE IN 2014    Not on File  Current Outpatient Medications  Medication Sig Dispense Refill  . amLODipine (NORVASC) 10  MG tablet Take 10 mg by mouth every morning.     Marland Kitchen atorvastatin (LIPITOR) 40 MG tablet Take 40 mg by mouth every morning.     . bismuth subsalicylate (PEPTO BISMOL) 262 MG/15ML suspension Take 30 mLs by mouth every 6 (six) hours as needed for indigestion or diarrhea or loose stools.     . chlorthalidone (HYGROTON) 25 MG tablet Take 25 mg by mouth 2 (two) times daily.     . ferrous sulfate 325 (65 FE) MG tablet Take 325 mg by mouth daily.     . hydrALAZINE (APRESOLINE) 25 MG tablet Take 25 mg 2 (two) times daily by mouth.    Marland Kitchen HYDROcodone-acetaminophen (NORCO/VICODIN) 5-325 MG tablet Take 1  tablet by mouth every 4 (four) hours as needed for moderate pain. 20 tablet 0  . omeprazole (PRILOSEC) 40 MG capsule Take 40 mg by mouth every morning.     . polyethylene glycol (MIRALAX / GLYCOLAX) packet Take 17 g by mouth daily as needed for mild constipation.     . vitamin B-12 (CYANOCOBALAMIN) 1000 MCG tablet Take 1 tablet (1,000 mcg total) by mouth daily. 30 tablet 3  . Vitamin D, Ergocalciferol, (DRISDOL) 50000 units CAPS capsule Take 50,000 Units by mouth every Tuesday.      No current facility-administered medications for this visit.    Facility-Administered Medications Ordered in Other Visits  Medication Dose Route Frequency Provider Last Rate Last Dose  . Darbepoetin Alfa (ARANESP) injection 100 mcg  100 mcg Subcutaneous Q30 days Sindy Guadeloupe, MD   100 mcg at 07/25/17 0924  . Darbepoetin Alfa (ARANESP) injection 100 mcg  100 mcg Subcutaneous Q30 days Sindy Guadeloupe, MD   100 mcg at 08/22/17 1015  . epoetin alfa (EPOGEN,PROCRIT) injection 40,000 Units  40,000 Units Subcutaneous Weekly Sindy Guadeloupe, MD   40,000 Units at 05/23/17 1145    Review of Systems Review of Systems  Constitutional: Negative.   Respiratory: Negative.   Cardiovascular: Negative.     Blood pressure (!) 148/82, pulse 75, resp. rate 16, height 5\' 7"  (1.702 m), weight 184 lb (83.5 kg), SpO2 97 %.  Physical Exam Physical Exam  Constitutional: He is oriented to person, place, and time. He appears well-developed and well-nourished.  Genitourinary:     Genitourinary Comments: Wound separated 2.5 x 7 cm  Neurological: He is alert and oriented to person, place, and time.  Skin: Skin is warm and dry.  Psychiatric: His behavior is normal.    Data Reviewed A. SKIN AND SOFT TISSUE, RIGHT GLUTEAL AREA; EXCISION:  - NODULAR BASAL CELL CARCINOMA, ULCERATED.  - DEEP AND PERIPHERAL MARGINS ARE NEGATIVE FOR BASAL CELL CARCINOMA.   Comment:  The closest peripheral margin is superior, 5 mm. The deep margin is   widely clear; there is no invasion into subcutaneous tissue. Extensive  dermal fibrosis, calcifications, and pigment incontinence are consistent  with radiation effect.    GROSS DESCRIPTION:  A. Labeled: Right gluteal basal cell cancer excision, superior long  stitch, lateral short stitch  Received: In formalin  Tissue fragment(s): 1  Size: 5.5 x 3.2 by up to 1.9 cm  Description: Received is a roughly elliptical fragment of gray skin with  a 2.6 x 2.0 cm slightly raised pale gray lesion   Assessment    Impaired healing based on previous radiation.  Anticipate healing by secondary intent.    Plan    Follow up 3 weeks. Continue showers Resume activities as tolerated. May apply triple antibiotic  ointment to area       HPI, Physical Exam, Assessment and Plan have been scribed under the direction and in the presence of Robert Bellow, MD. Karie Fetch, RN  I have completed the exam and reviewed the above documentation for accuracy and completeness.  I agree with the above.  Haematologist has been used and any errors in dictation or transcription are unintentional.  Hervey Ard, M.D., F.A.C.S.  Forest Gleason Leilana Mcquire 12/24/2017, 9:12 PM

## 2017-12-24 NOTE — Progress Notes (Signed)
HENSLEY, TREAT ([id number]) Visit Report for 12/19/2017 Chief Complaint Document Details Patient Name: [name], [name] Date of Service: 12/19/2017 8:30 AM Medical Record Number: [id number] Patient Account Number: [id number] Date of Birth/Sex: 09-07-1950 (67 y.o. M) Treating RN: Roger Shelter Primary Care Provider: Lamonte Sakai Other Clinician: Referring Provider: Lamonte Sakai Treating Provider/Extender: Melburn Hake, HOYT Weeks in Treatment: 40 Information Obtained from: Patient Chief Complaint Patient presents for treatment of an open diabetic ulcer to both feet Electronic Signature(s) Signed: 12/20/2017 8:54:03 AM By: Worthy Keeler PA-C Entered By: Worthy Keeler on 12/19/2017 09:25:55 [name], [name] ([id number]) -------------------------------------------------------------------------------- HPI Details Patient Name: [name], [name] Date of Service: 12/19/2017 8:30 AM Medical Record Number: [id number] Patient Account Number: [id number] Date of Birth/Sex: 1950/12/22 (67 y.o. M) Treating RN: Roger Shelter Primary Care Provider: Lamonte Sakai Other Clinician: Referring Provider: Lamonte Sakai Treating Provider/Extender: Melburn Hake, HOYT Weeks in Treatment: 60 History of Present Illness Location: bilateral feet ulceration on the toes Quality: Patient reports experiencing a dull pain to affected area(s). Severity: Patient states wound are getting better Duration: Patient has had the wound for > 3 months prior to seeking treatment at the wound center Timing: Pain in wound is constant (hurts all the time) Context: The wound would happen gradually Modifying Factors: Other treatment(s) tried include:treatment for lymphedema and is seen by the podiatrist Dr. Caryl Comes Associated Signs and Symptoms: Patient reports having increase swelling. HPI Description: 67 year old patient here to see as for bilateral feet ulceration to on his left first and second toe and 2 on his right first and second  toe, which she's had for about 4 months. 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 Donella Stade. The patient is also undergoing workup by medical oncology for a intra-abdominal lymphadenopathy. 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 He is also status post appendectomy, inguinal lymph node biopsy, rectal biopsy and rectal examination under anesthesia. he currently smokes cigarettes about half packet a day. In March of this year he was seen by Dr. Hortencia Pilar, for evaluation of bilateral varicose veins and besides wearing compression stockings he had recommended laser ablation of the right and left great saphenous veins to eleviate the symptoms and complications of severe superficial venous reflux disease. He also recommended lymphedema pumps for better control of his lymphedema. The patient recently has had on 08/23/2016, right greater saphenous vein ablation with the laser energy Earlier lower extremity venous reflux examination done on 05/08/2016 showed no DVT or SVT both lower legs but incompetence of bilateral great saphenous veins was present. A lower arterial study was also done and there was no significant right lower and left lower extremity problems based on a normal toe brachial index bilaterally and the ABI was 1.21 the left and 1.23 on the right. His post ablation venous duplex examination showed successful ablation of the right GS vein with thrombus formation 2 below the right saphenofemoral junction. The deep system was patent without evidence of thrombosis and this was done on 08/30/2016. the patient also has a squamous cell cancer of the skin of the buttock and is recently undergone radiation therapy for this prior to excisional  surgery. Addendum: regarding his x-rays done today and x-ray of the left foot -- IMPRESSION: No objective evidence of osteomyelitis. There  are soft tissue changes which may reflect cellulitis. X-ray of the right foot -- IMPRESSION:Findings compatible with cellulitis of the toes. No objective evidence of osteomyelitis is observed. 10/01/16 on evaluation today patient's wounds appeared to be doing some better. I did review the x-rays as well which showed no evidence of osteomyelitis although there was evidence on x-ray of cellulitis. He fortunately is not having any discomfort although he continues to have some swelling. He does not remember being on any antibiotics recently. 10/15/16 on evaluation today patient's wounds overall appear to be doing better although he does have a new location noted on the left foot. Fortunately he is not having significant pain. It almost has the appearance that something is rubbing on the end of his toes but he wears the open toe shoes and according to what he is telling me never wears anything that would rub on his foot. There is no evidence of infection and specifically no evidence of a fungal infection 10/22/16 On evaluation today patient's wounds appeared to be doing better compared to last week in regard to his bilateral [name], [name] ([id number]) lower extremities. Fortunately I happy with how things are progressing although he still has ulcers I feel like that he is improving and appropriate manner. 11/12/16 on evaluation today patient appears to be doing well in regard to his bilateral feet and the respective wounds. We have been using surrounding her dressings along with an antifungal cream which seems to be doing very well. He has no bilateral dysfunction noticed that the rituals are weight loss at this point. He also has no nausea or vomiting a note purulent discharge. He did see Vein and vascular today and he tells me that they told him he could have surgery  for his venous stasis but they did not feel like it was worth it in his words. Fortunately patient's wounds do appear to be getting sneakily better. 11/26/2016 -- he says he is going to have some surgery during this week at [facility] for possibly a colon resection. 12/31/2016 -- the patient has been noncompliant with his smoking and I'm not sure whether he is also started drinking again. He continues to be very nonchalant about his care 01/14/2017 -- the patient's HandP has been reviewed well and I understand he is being compliant with trying to give up smoking and his local dressing changes. He does not have any surgical options of 4 to him by his vascular surgeons.he was last seen in early August by Dr. Hortencia Pilar who recommended compression stockings,and possibly lymph pumps in 2-3 months after doing a review ultrasound. 01/28/2017 - the patient did not have any fresh complaints but on examination I noted a large lacerated wound on the plantar aspect of his right fourth toe which had a lot of necrotic debris and it probes down to bone. 02/07/2017 -- x-ray of the right foot -- IMPRESSION: Soft tissue swelling about the first through fourth toes consistent with cellulitis. New destructive change in the tuft of the distal phalanx of the great toe is consistent with osteomyelitis. 02/14/2017 -- the patient's MRI is pending this coming Monday and he still continues to smoke. We have again gone over off loading of his wounds in great detail and he says he's been compliant. 02/21/2017 -- MR of the right foot -- IMPRESSION: 1. Soft tissue ulcer at the tip of the first, second and third toe knows. Cortical irregularity and bone marrow edema in the first distal phalanx most concerning  for osteomyelitis. Mild marrow edema in the second and third distal phalanx without definite cortical destruction which may reflect early osteomyelitis versus reactive marrow edema. 2. Soft tissue edema surrounding  the first phalanx most consistent with cellulitis. the patient was also recently evaluated by his medical oncologist Dr. 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 She is treating him with weekly Procrit. She is also keeping intra-abdominal lymphadenopathy and right lower lobe lung nodule under observation. 04/04/2017 -- he was seen by Dr. Adrian Prows on 03/25/2017 -- after review he empirically put him on ciprofloxacin and doxycycline as they have good bone penetration and good bioavailability and it will cover the usual pathogens and diabetic foot osteomyelitis. He will check inflammatory markers and plan a 20-68 week old records. C-reactive protein was 0.3 and the ESR was 72 04/18/17 on evaluation today patient appears to be doing about the same in regard to his lower extremity wounds bilaterally. He has continued to use the antifungal cream which does seem to be beneficial. Nonetheless the ulcers do seem to in some areas be epithelial eyes over and in other areas are still open. He is having no significant discomfort. 04/25/17-he is here in follow-up evaluation for multiple ulcerations to multiple toes bilaterally. He states he did see Dr. Ola Spurr again last week and continues antibiotic therapy. He is voicing no complaints or concerns, will continue with current treatment plan will possibility of adding compression therapy next week after an additional week of treatment/lotions to BLE prescribed by Dr Ola Spurr 05/02/17 he is here in follow up for for multiple ulcers to multiple toes bilaterally. we will stop using antifungal cream and will continue with silvercel and follow up next week 05/09/17-he is here in follow-up for multiple ulcerations to multiple toes bilaterally. There is improvement in appearance. He has not completely stopped using antifungal cream, but admits he has not using it between the toes. He has an  appointment with Dr. Ola Spurr on 2/11, continues on doxycycline and Cipro. It has been 5 weeks of antibiotic therapy, we will order plain film xray to evaluate for osteomyelitis next week, prior to follow up with ID. Will continue with silvercel and follow up next week 05/16/17-he is here in follow-up evaluation for multiple ulcerations to multiple toes bilaterally and new wound to the right posterior heel. There is essentially no change in appearance, deteriorating measurements; he has a history of waxing and waning measurements. He admits that he continues to apply moisturizer/cream/ointment to his toes despite weekly reminders to only apply silvercel to his toes. He states that he thinks the surgical shoe contributed to the superficial ulcer to his posterior [name], [name] ([id number]) heel, he is unable to articulate if this was an area of dry cracked skin as he has a similar area to the left heel. He now is wearing open toed slippers. He has an appointment with Dr. Ola Spurr on 2/11. We have ordered x-rays for her bilateral feet; he was advised to obtain the x-rays today or tomorrow. He will follow-up next week 05/23/17-he is here in follow-up evaluation for multiple ulcerations to multiple toes bilaterally and the right posterior heel. There is improvement in maceration. He has been compliant and not applying any moisturizing agent to his toes. He has been using Lac-Hydrin for his lower extremities with improvement. He did not go to his appointment on Monday with Dr. Ola Spurr secondary to financial concerns. X-rays for her bilateral feet  showed: LEFT FOOT with slight erosion of the tuft of the distal phalanges of the left first and second toe suspicious for osteomyelitis, RIGHT FOOT with 1.erosion of the tufts of the distal phalanges of the right first second and possibly third toes consistent with osteomyelitis, 2 no definitive abnormality of the calcaneus is seen on the images obtained, 3.  Plantar calcaneal degenerative spur. We briefly discussed hyperbaric adjunctive therapy for treatment of chronic refractory osteomyelitis. I do not find an a1c in EMR, will contact PCP for record, or order if needed. He has been encouraged to contact Dr Ola Spurr office regarding the follow-up appointment, encouraged him to inquire about payment plan. We will continue with same treatment plan and follow-up next week. He states he is still taking antibiotics and has "a lot" left. He states he has been taking them as directed, 2 pills twice daily. According to Dr. Blane Ohara office notes he was originally started on 12/17 for 4 weeks and extended on 1/14 for an additional 4 weeks. He should be done with his antibiotic therapy, he was advised to bring his bottles and to his next appointment, we will contact pharmacy. 05/30/17-he is here in follow-up evaluation for multiple ulcerations to multiple toes bilaterally and the right posterior heel. He is accompanied by his brother-in-law. Wounds are stable. He has yet to make up with Dr. Ola Spurr. We contacted his PCP, with no record of recent A1c we will draw an A1c. His brother-in-law states that he was taken off all of his diabetic medication secondary to kidney function. 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 The antibiotics were counted and have approximately 2 weeks left, although they should be complete. The pill organizer reveals missing days. We discussed the need for consistent medications, to have optimal benefit of medication. He has a cousin that lives with him and he will ask her to check his organizer daily. He has been advised to follow up with Dr Ola Spurr, and will go by the office today. He has been advised to quit smoking. 06/06/17-he is here in follow up evaluation. He has had to make an appointment with Dr. Ola Spurr. He did have  blood work obtained, a1c 5. He continues to take antibiotic therapy. Significant improvement in bilateral lower extremity edema with compression therapy. Essentially no change in ulcerations to toes. He states he is "going to try something different" and "let me know next week" if it works; he would not provide any additional information and was encouraged to follow our orders. We will follow up next week 06/13/17-he is here in follow-up evaluation. He has an appointment with Dr. Ola Spurr tomorrow morning. He states he purchased an ointment from [facility] and applied to his toes for 3 days, he does not remember the name of the ointment. There is improvement to his wounds, minimal maceration. He continues to take antibiotic therapy, this should have been completed last month. His brother-in-law who regularly accompanies his appointments was asked to take the bottles to the appointment tomorrow with Dr. Ola Spurr so he is aware. We will continue with 3 layer compression, and order OPEN TOE compression 20-30mmHg; we will apply compression stockings next week. He continues to smoke, smoked "2 cigarettes" last week 06/20/17 on evaluation today patient did receive his compression stockings which he has with him today for both lower extremities. With that being said he tells me at this point in time that he is very  happy to have these he really is not a big fan of the compression wraps that we have been utilizing although they have been of great benefit for him. Nonetheless at this point he does want to switch to the compression stockings. In my opinion as long as he is continuing with compression I'm okay with the stockings or the wraps. 06/27/17-he is here in follow-up evaluation for multiple ulcerations to his bilateral toes. There is some improvement in appearance. He is compliant in wearing his compression stockings with significant improvement in lower extremity edema. He saw Dr Ola Spurr on 3/8,  per his notes they would redraw ESR and CRP; plan to continue antibiotic therapy if these remain elevated. I do not see an ESR or CRP level in Epic. The patient continues to take antibiotics. 07/11/17-He is here in follow-up evaluation for multiple ulcerations to multiple toes bilaterally. He presents with complete epithelialization to the right third toe; there has been no deterioration. He continues on antibiotic therapy. He will follow-up next week 07/18/17-He is here in follow-up evaluation for multiple ulcerations to multiple toes bilaterally. He continues to make improvement. He continues on antibiotic therapy. He states he has been using something additional to our orders, he does not elaborate but states he will bring it in next week. 07/25/17-He is here in follow up evaluation for multiple ulcerations to bilateral toes. He is stable. He has completed antibiotic therapy. He admits to "filing" his toes after showers each evening, this is what he was referencing last week; he does not filing for the wounds. We will switch to [facility] and monitor for any improvement, he will follow-up next week 08/01/17-He is here in follow-up evaluation. He admits to "picking" at his toes after cleansing yesterday, leading to new areas of tissue loss on the bilateral second toe. There is improvement noted to the bilateral great toe. We will dress toes today and hope that they maintain until Monday where he will come in for a nurse visit. He has been advised, multiple times with expressed verbalization, to change the dressings to silvercel if the dressings get wet prior to [facility] appointment. [name], [name] ([id number]) 08/08/17-He is here in follow-up evaluation for bilateral first and second toe ulcerations. There is significant improvement to all ulcerations since last visit. We will switch to [facility] to all wounds and he will continue with nurse visits on a Monday/Thursday schedule and follow-up  with me in 2 weeks. He continues to smoke, 1-3 cigarettes per day, and has been encouraged to not smoke until his next follow-up in 2 weeks. 08/15/17 on evaluation today patient's ulcers on his toes actually appear to be doing fairly well the right to ulcers may be a little bit more moist compared to the left I'm not really sure exactly why as the openings appear to be very small and I'm not seeing any evidence of anything significant as far as you lightest or otherwise. Nonetheless this may just be a small setback he's been doing very well with the [facility] Dressing 08/22/17-He is here in follow-up evaluation for ulcerations to his bilateral first and second toes, there is small/scant amount of drainage noted on today's dressing. He continues with Hydrofera Blue. He continues to smoke, 1 cigarette a day. Voices no complaint or concerns, compliant and compression therapy today. He'll follow-up next week 08/29/17-He is here in follow-up evaluation for ulcerations to his bilateral first and second toes, with a new wound to the left third toe. He states  he cut himself while cutting the toenail. He presents today with more maceration and increased measurements; waxing and waning measurements and moisture has been an ongoing issue. He continues to smoke a proximally 1 cigarette per day, but states he has not smoked since Sunday. We will initiate medihoney daily and evaluate next week. He presents today without compression stockings 09/05/17-He is here in follow-up evaluation for ulcerations to bilateral first and second and left third toe. He did not pick up the medihoney and therefore has not been changing his dressing. He admits to inconsistent where of compression stockings, admits to pain to his bilateral lower extremities with unilateral edema (right greater than left). The ulcerations to multiple toes are more macerated and larger in size than last week. He is wearing compression therapy today. He  admits to an overall feeling of weakness and fatigue and has been encouraged to contact his PCP for evaluation; he has a known history of anemia and intraabdominal lymphadenopathy. We will return to silvercel and he will follow up next week 09/19/17-He is here in follow-up evaluation for ulcerations to bilateral first and second toe.he was unable to make last week's appointment. The culture that was obtained on 5/30 grew clindamycin sensitive MRSA; he was initiated on 6/4 but did not start it until 6/7. There is significant improvement in all wounds, healing to left second and third toe. He also admits to having no alcohol or smoking for the last 6 days. We will continue with same treatment plan I will extend the clindamycin for an additional 10 days and he will follow-up next week. 09/26/17- He is here in follow evaluation for multiple ulcerations to multiple toes bilaterally. He continues to be non-compliant with dressing, compression therapy. He states he is taking the antibiotics as prescribed, although his recall is inconsistent. There is noted deterioration in all ulcers. We will continue same treatment plan, antibiotic therapy and he will follow up next week. 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 This information is obtained from the medical record, as he cannot articulate specifics to his diagnosis or treatment plan. 10/17/17-He is here in follow-up evaluation for multiple ulcerations to multiple toes bilaterally. There is chronic waxing and waning improvement/deterioration. On today's exam they appear improved, dry without maceration. He continues to apply a variety of topical agents, stating that he has switched between the blue product and silver product. In my opinion, this will be a chronic waxing and waning  giving his intermittent compliance. He will follow-up in 2 weeks. As it relates to the biopsy taken from his buttock, he has not followed up with that and has not started radiation therapy. 10/31/17-He is here for evaluation for multiple ulcerations to multiple toes bilaterally. Continues with waxing and waning improvement versus deterioration. They are stable on today's exam without significant maceration. 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 We will continue with every 2 week follow-up appointments 11/14/17- He is seen in follow-up evaluation for multiple ulcerations to multiple bilateral toes. He is scheduled for excision of basal cell carcinoma to his right buttock on Monday 8/12. He continues to have chronic waxing and waning of his ulcers; today is a stable day with minimal drainage and left ear is better than right toes. I will recount to infectious disease regarding chronic suppressive  therapy. He admits to being more compliant with compression stockings, although he is not wearing any today. He has been encouraged to continue with dressing changes as ordered and compression therapy daily. He will be seen in 2 weeks 11/28/17-He is seen in follow up evaluation for multiple ulcerations to multiple toes bilaterally. He had an overnight admission at [facility] (8/11-8/12) for symptomatic anemia, received two units prbc. His surgical date has been moved to 9/94 excision of basal cell carcinoma to the right buttock. He has been noncompliant in wearing compression stockings, applying topical treatment as ordered; he has been applying a "cream", Hydrofera Blue, silvercel. He is currently on no antibiotic therapy; we will send for re-referral to ID for suppressive therapy given his multiple co-morbidities, poor surgical candidate, unwilling to have toe amputation and general non-adherence. 12/19/17 on evaluation today patient actually appears to be doing very  well in regard to his toes at least compared to last time I saw him. He again has been coming to our office for quite a bit of time. 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 He states is hurting a little bit but doing well in general. I'm see were not taking care of this area dermatology is. He does have his appointment with infectious disease upcoming which they re-consult for suppressive therapy that's next Monday. ALP, GOLDWATER ([id number]) Electronic Signature(s) Signed: 12/20/2017 8:54:03 AM By: Worthy Keeler PA-C Entered By: Worthy Keeler on 12/19/2017 09:32:48 [name], [name] ([id number]) -------------------------------------------------------------------------------- Physical Exam Details Patient Name: Schroeter, Karsten E. Date of Service: 12/19/2017 8:30 AM Medical Record Number: [id number] Patient Account Number: [id number] Date of Birth/Sex: [birth year] (67 y.o. M) Treating RN: Roger Shelter Primary Care Provider: Lamonte Sakai Other Clinician: Referring Provider: Lamonte Sakai Treating Provider/Extender: Melburn Hake, HOYT Weeks in Treatment: 20 Constitutional Well-nourished and well-hydrated in no acute distress. Respiratory normal breathing without difficulty. clear to auscultation bilaterally. Cardiovascular regular rate and rhythm with normal S1, S2. Psychiatric this patient is able to make decisions and demonstrates good insight into disease process. Alert and Oriented x 3. pleasant and cooperative. Notes Patient's toes actually appear to be doing fairly well there's not much in the way of maceration there are still some skin breakdown areas unfortunately noted at this point. Nonetheless I do feel like he needs to put his compression stockings which one turned help with his toes as well and that it will keep the fluid from building up in his lower extremities in  general. He did not have those on today. He tells me that he will go home and put them on when he leaves here. I told him I would believe that when I see it. Electronic Signature(s) Signed: 12/20/2017 8:54:03 AM By: Worthy Keeler PA-C Entered By: Worthy Keeler on 12/19/2017 09:33:24 Busby, [name] ([id number]) -------------------------------------------------------------------------------- Physician Orders Details Patient Name: [name], [name] Date of Service: 12/19/2017 8:30 AM Medical Record Number: [id number] Patient Account Number: [id number] Date of Birth/Sex: 10-May-1950 (67 y.o. M) Treating RN: Roger Shelter Primary Care Provider: Lamonte Sakai Other Clinician: Referring Provider: Lamonte Sakai Treating Provider/Extender: Melburn Hake, HOYT Weeks in Treatment: 70 Verbal / Phone Orders: No Diagnosis Coding ICD-10 Coding Code Description L97.521 Non-pressure chronic ulcer of other part of left foot limited to breakdown of skin L97.511 Non-pressure chronic ulcer of other part of right foot limited to breakdown of skin E11.621 Type  2 diabetes mellitus with foot ulcer I87.323 Chronic venous hypertension (idiopathic) with inflammation of bilateral lower extremity I89.0 Lymphedema, not elsewhere classified F17.218 Nicotine dependence, cigarettes, 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 abuse 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, right ankle and foot Wound Cleansing Wound #1 Right Toe Great o Clean wound with Normal Saline. Wound #14 Left Toe Second o Clean wound with Normal Saline. Wound #16 Right,Plantar Toe Great o Clean wound with Normal Saline. Wound #2 Right Toe Second o Clean wound with Normal Saline. Wound #5 Left Toe Great o Clean wound with Normal Saline. Anesthetic (add to Medication List) Wound #1 Right Toe Great o Topical Lidocaine 4% cream applied to wound bed prior to debridement (In Clinic  Only). Wound #14 Left Toe Second o Topical Lidocaine 4% cream applied to wound bed prior to debridement (In Clinic Only). Wound #16 Right,Plantar Toe Great o Topical Lidocaine 4% cream applied to wound bed prior to debridement (In Clinic Only). Wound #2 Right Toe Second o Topical Lidocaine 4% cream applied to wound bed prior to debridement (In Clinic Only). Wound #5 Left Toe Great o Topical Lidocaine 4% cream applied to wound bed prior to debridement (In Clinic Only). WALEED, DETTMAN ([id number]) Primary Wound Dressing Wound #1 Right Toe Great o Silver Alginate Wound #14 Left Toe Second o Silver Alginate Wound #16 Right,Plantar Toe Great o Silver Alginate Wound #2 Right Toe Second o Silver Alginate Wound #5 Left Toe Great o Silver Alginate Secondary Dressing Wound #1 Right Toe Great o ABD pad o Dry Gauze o Conform/Kerlix Wound #14 Left Toe Second o ABD pad o Dry Gauze o Conform/Kerlix Wound #16 Right,Plantar Toe Great o ABD pad o Dry Gauze o Conform/Kerlix Wound #2 Right Toe Second o ABD pad o Dry Gauze o Conform/Kerlix Wound #5 Left Toe Great o ABD pad o Dry Gauze o Conform/Kerlix Dressing Change Frequency Wound #1 Right Toe Great o Change dressing every day. Wound #14 Left Toe Second o Change dressing every day. Wound #16 Right,Plantar Toe Great o Change dressing every day. Wound #2 Right Toe Second o Change dressing every day. Wound #5 Left [address], Terry E. ([id number]) o Change dressing every day. Follow-up Appointments Wound #1 Right Toe Great o Return Appointment in 2 weeks. Wound #14 Left Toe Second o Return Appointment in 2 weeks. Wound #16 Right,Plantar Toe Great o Return Appointment in 2 weeks. Wound #2 Right Toe Second o Return Appointment in 2 weeks. Wound #5 Left Toe Great o Return Appointment in 2 weeks. Edema Control Wound #1 Right Toe Great o Patient to wear  own compression stockings Wound #14 Left Toe Second o Patient to wear own compression stockings Wound #16 Right,Plantar Toe Great o Patient to wear own compression stockings Wound #2 Right Toe Second o Patient to wear own compression stockings Wound #5 Left Toe Great o Patient to wear own compression stockings Additional Orders / Instructions Wound #1 Right Toe Great o Stop Smoking o Increase protein intake. Wound #14 Left Toe Second o Stop Smoking o Increase protein intake. Wound #16 Right,Plantar Toe Great o Stop Smoking o Increase protein intake. Wound #2 Right Toe Second o Stop Smoking o Increase protein intake. Wound #5 Left Toe Great o Stop Smoking o Increase protein intake. MIKAEL, SKODA ([id number]) Electronic Signature(s) Signed: 12/20/2017 8:12:43 AM By: Roger Shelter Signed: 12/20/2017 8:54:03 AM By: Worthy Keeler PA-C Entered By: Roger Shelter on 12/19/2017 09:31:35 Heap, [name] ([id number]) -------------------------------------------------------------------------------- Problem List Details  Patient Name: [name], [name] Date of Service: 12/19/2017 8:30 AM Medical Record Number: [id number] Patient Account Number: [id number] Date of Birth/Sex: Sep 11, 1950 (67 y.o. M) Treating RN: Roger Shelter Primary Care Provider: Lamonte Sakai Other Clinician: Referring Provider: Lamonte Sakai Treating Provider/Extender: Melburn Hake, HOYT Weeks in Treatment: 56 Active Problems ICD-10 Evaluated Encounter Code Description Active Date Today Diagnosis L97.521 Non-pressure chronic ulcer of other part of left foot limited to 09/21/2016 No Yes breakdown of skin L97.511 Non-pressure chronic ulcer of other part of right foot limited to 09/21/2016 No Yes breakdown of skin E11.621 Type 2 diabetes mellitus with foot ulcer 09/21/2016 No Yes I87.323 Chronic venous hypertension (idiopathic) with inflammation of 09/21/2016 No Yes bilateral lower  extremity I89.0 Lymphedema, not elsewhere classified 09/21/2016 No Yes F17.218 Nicotine dependence, cigarettes, with other nicotine-induced 09/21/2016 No Yes disorders F10.19 Alcohol abuse with unspecified alcohol-induced disorder 09/21/2016 No Yes M86.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, right ankle and foot 02/21/2017 No Yes Inactive Problems Resolved Problems Electronic Signature(s) DENARIO, BAGOT ([id number]) Signed: 12/20/2017 8:54:03 AM By: Worthy Keeler PA-C Entered By: Worthy Keeler on 12/19/2017 09:25:46 Burnette, [name] ([id number]) -------------------------------------------------------------------------------- Progress Note Details Patient Name: [name], [name] Date of Service: 12/19/2017 8:30 AM Medical Record Number: [id number] Patient Account Number: [id number] Date of Birth/Sex: [birth year] (67 y.o. M) Treating RN: Roger Shelter Primary Care Provider: Lamonte Sakai Other Clinician: Referring Provider: Lamonte Sakai Treating Provider/Extender: Melburn Hake, HOYT Weeks in Treatment: 42 Subjective Chief Complaint Information obtained from Patient Patient presents for treatment of an open diabetic ulcer to both feet History of Present Illness (HPI) The following HPI elements were documented for the patient's wound: Location: bilateral feet ulceration on the toes Quality: Patient reports experiencing a dull pain to affected area(s). Severity: Patient states wound are getting better Duration: Patient has had the wound for > 3 months prior to seeking treatment at the wound center Timing: Pain in wound is constant (hurts all the time) Context: The wound would happen gradually Modifying Factors: Other treatment(s) tried include:treatment for lymphedema and is seen by the podiatrist Dr. Caryl Comes Associated Signs and Symptoms: Patient reports having increase swelling. 67 year old patient here to see as for bilateral feet ulceration to on his left first and second toe and 2 on his  right first and second toe, which she's had for about 4 months. 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 Donella Stade. The patient is also undergoing workup by medical oncology for a intra-abdominal lymphadenopathy. 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 He is also status post appendectomy, inguinal lymph node biopsy, rectal biopsy and rectal examination under anesthesia. he currently smokes cigarettes about half packet a day. In March of this year he was seen by Dr. Hortencia Pilar, for evaluation of bilateral varicose veins and besides wearing compression stockings he had recommended laser ablation of the right and left great saphenous veins to eleviate the symptoms and complications of severe superficial venous reflux disease. He also recommended lymphedema pumps for better control of his lymphedema. The patient recently has had on 08/23/2016, right greater saphenous vein ablation with the laser energy Earlier lower extremity venous reflux examination done on 05/08/2016 showed no DVT or SVT both lower legs but incompetence of bilateral great saphenous veins was present. A lower arterial study  was also done and there was no significant right lower and left lower extremity problems based on a normal toe brachial index bilaterally and the ABI was 1.21 the left and 1.23 on the right. His post ablation venous duplex examination showed successful ablation of the right GS vein with thrombus formation 2 below the right saphenofemoral junction. The deep system was patent without evidence of thrombosis and this was done on 08/30/2016. the patient also has a squamous cell cancer of the skin of the buttock and is recently undergone radiation therapy for this  prior to excisional surgery. Addendum: regarding his x-rays done today and x-ray of the left foot -- IMPRESSION: No objective evidence of osteomyelitis. There are soft tissue changes which may reflect cellulitis. X-ray of the right foot -- IMPRESSION:Findings compatible with cellulitis of the toes. No objective evidence of osteomyelitis is observed. 10/01/16 on evaluation today patient's wounds appeared to be doing some better. I did review the x-rays as well which showed no evidence of osteomyelitis although there was evidence on x-ray of cellulitis. He fortunately is not having any discomfort Puig, Breylen E. ([id number]) although he continues to have some swelling. He does not remember being on any antibiotics recently. 10/15/16 on evaluation today patient's wounds overall appear to be doing better although he does have a new location noted on the left foot. Fortunately he is not having significant pain. It almost has the appearance that something is rubbing on the end of his toes but he wears the open toe shoes and according to what he is telling me never wears anything that would rub on his foot. There is no evidence of infection and specifically no evidence of a fungal infection 10/22/16 On evaluation today patient's wounds appeared to be doing better compared to last week in regard to his bilateral lower extremities. Fortunately I happy with how things are progressing although he still has ulcers I feel like that he is improving and appropriate manner. 11/12/16 on evaluation today patient appears to be doing well in regard to his bilateral feet and the respective wounds. We have been using surrounding her dressings along with an antifungal cream which seems to be doing very well. He has no bilateral dysfunction noticed that the rituals are weight loss at this point. He also has no nausea or vomiting a note purulent discharge. He did see Vein and vascular today and he tells me that they told him he  could have surgery for his venous stasis but they did not feel like it was worth it in his words. Fortunately patient's wounds do appear to be getting sneakily better. 11/26/2016 -- he says he is going to have some surgery during this week at [facility] for possibly a colon resection. 12/31/2016 -- the patient has been noncompliant with his smoking and I'm not sure whether he is also started drinking again. He continues to be very nonchalant about his care 01/14/2017 -- the patient's HandP has been reviewed well and I understand he is being compliant with trying to give up smoking and his local dressing changes. He does not have any surgical options of 4 to him by his vascular surgeons.he was last seen in early August by Dr. Hortencia Pilar who recommended compression stockings,and possibly lymph pumps in 2-3 months after doing a review ultrasound. 01/28/2017 - the patient did not have any fresh complaints but on examination I noted a large lacerated wound on the plantar aspect of his right fourth toe which had a lot  of necrotic debris and it probes down to bone. 02/07/2017 -- x-ray of the right foot -- IMPRESSION: Soft tissue swelling about the first through fourth toes consistent with cellulitis. New destructive change in the tuft of the distal phalanx of the great toe is consistent with osteomyelitis. 02/14/2017 -- the patient's MRI is pending this coming Monday and he still continues to smoke. We have again gone over off loading of his wounds in great detail and he says he's been compliant. 02/21/2017 -- MR of the right foot -- IMPRESSION: 1. Soft tissue ulcer at the tip of the first, second and third toe knows. Cortical irregularity and bone marrow edema in the first distal phalanx most concerning for osteomyelitis. Mild marrow edema in the second and third distal phalanx without definite cortical destruction which may reflect early osteomyelitis versus reactive marrow edema. 2. Soft  tissue edema surrounding the first phalanx most consistent with cellulitis. the patient was also recently evaluated by his medical oncologist Dr. 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 She is treating him with weekly Procrit. She is also keeping intra-abdominal lymphadenopathy and right lower lobe lung nodule under observation. 04/04/2017 -- he was seen by Dr. Adrian Prows on 03/25/2017 -- after review he empirically put him on ciprofloxacin and doxycycline as they have good bone penetration and good bioavailability and it will cover the usual pathogens and diabetic foot osteomyelitis. He will check inflammatory markers and plan a 69-28 week old records. C-reactive protein was 0.3 and the ESR was 72 04/18/17 on evaluation today patient appears to be doing about the same in regard to his lower extremity wounds bilaterally. He has continued to use the antifungal cream which does seem to be beneficial. Nonetheless the ulcers do seem to in some areas be epithelial eyes over and in other areas are still open. He is having no significant discomfort. 04/25/17-he is here in follow-up evaluation for multiple ulcerations to multiple toes bilaterally. He states he did see Dr. Ola Spurr again last week and continues antibiotic therapy. He is voicing no complaints or concerns, will continue with current treatment plan will possibility of adding compression therapy next week after an additional week of treatment/lotions to BLE prescribed by Dr Ola Spurr 05/02/17 he is here in follow up for for multiple ulcers to multiple toes bilaterally. we will stop using antifungal cream and will continue with silvercel and follow up next week Badour, Anurag E. ([id number]) 05/09/17-he is here in follow-up for multiple ulcerations to multiple toes bilaterally. There is improvement in appearance. He has not completely stopped using antifungal cream, but  admits he has not using it between the toes. He has an appointment with Dr. Ola Spurr on 2/11, continues on doxycycline and Cipro. It has been 5 weeks of antibiotic therapy, we will order plain film xray to evaluate for osteomyelitis next week, prior to follow up with ID. Will continue with silvercel and follow up next week 05/16/17-he is here in follow-up evaluation for multiple ulcerations to multiple toes bilaterally and new wound to the right posterior heel. There is essentially no change in appearance, deteriorating measurements; he has a history of waxing and waning measurements. He admits that he continues to apply moisturizer/cream/ointment to his toes despite weekly reminders to only apply silvercel to his toes. He states that he thinks the surgical shoe contributed to the superficial ulcer to his posterior heel, he is unable to articulate if this was an area of  dry cracked skin as he has a similar area to the left heel. He now is wearing open toed slippers. He has an appointment with Dr. Ola Spurr on 2/11. We have ordered x-rays for her bilateral feet; he was advised to obtain the x-rays today or tomorrow. He will follow-up next week 05/23/17-he is here in follow-up evaluation for multiple ulcerations to multiple toes bilaterally and the right posterior heel. There is improvement in maceration. He has been compliant and not applying any moisturizing agent to his toes. He has been using Lac-Hydrin for his lower extremities with improvement. He did not go to his appointment on Monday with Dr. Ola Spurr secondary to financial concerns. X-rays for her bilateral feet showed: LEFT FOOT with slight erosion of the tuft of the distal phalanges of the left first and second toe suspicious for osteomyelitis, RIGHT FOOT with 1.erosion of the tufts of the distal phalanges of the right first second and possibly third toes consistent with osteomyelitis, 2 no definitive abnormality of the calcaneus is seen  on the images obtained, 3. Plantar calcaneal degenerative spur. We briefly discussed hyperbaric adjunctive therapy for treatment of chronic refractory osteomyelitis. I do not find an a1c in EMR, will contact PCP for record, or order if needed. He has been encouraged to contact Dr Ola Spurr office regarding the follow-up appointment, encouraged him to inquire about payment plan. We will continue with same treatment plan and follow-up next week. He states he is still taking antibiotics and has "a lot" left. He states he has been taking them as directed, 2 pills twice daily. According to Dr. Blane Ohara office notes he was originally started on 12/17 for 4 weeks and extended on 1/14 for an additional 4 weeks. He should be done with his antibiotic therapy, he was advised to bring his bottles and to his next appointment, we will contact pharmacy. 05/30/17-he is here in follow-up evaluation for multiple ulcerations to multiple toes bilaterally and the right posterior heel. He is accompanied by his brother-in-law. Wounds are stable. He has yet to make up with Dr. Ola Spurr. We contacted his PCP, with no record of recent A1c we will draw an A1c. His brother-in-law states that he was taken off all of his diabetic medication secondary to kidney function. 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 The antibiotics were counted and have approximately 2 weeks left, although they should be complete. The pill organizer reveals missing days. We discussed the need for consistent medications, to have optimal benefit of medication. He has a cousin that lives with him and he will ask her to check his organizer daily. He has been advised to follow up with Dr Ola Spurr, and will go by the office today. He has been advised to quit smoking. 06/06/17-he is here in follow up evaluation. He has had to make an appointment with  Dr. Ola Spurr. He did have blood work obtained, a1c 5. He continues to take antibiotic therapy. Significant improvement in bilateral lower extremity edema with compression therapy. Essentially no change in ulcerations to toes. He states he is "going to try something different" and "let me know next week" if it works; he would not provide any additional information and was encouraged to follow our orders. We will follow up next week 06/13/17-he is here in follow-up evaluation. He has an appointment with Dr. Ola Spurr tomorrow morning. He states he purchased an ointment from [facility] and applied to his toes for 3  days, he does not remember the name of the ointment. There is improvement to his wounds, minimal maceration. He continues to take antibiotic therapy, this should have been completed last month. His brother-in-law who regularly accompanies his appointments was asked to take the bottles to the appointment tomorrow with Dr. Ola Spurr so he is aware. We will continue with 3 layer compression, and order OPEN TOE compression 20-30mmHg; we will apply compression stockings next week. He continues to smoke, smoked "2 cigarettes" last week 06/20/17 on evaluation today patient did receive his compression stockings which he has with him today for both lower extremities. With that being said he tells me at this point in time that he is very happy to have these he really is not a big fan of the compression wraps that we have been utilizing although they have been of great benefit for him. Nonetheless at this point he does want to switch to the compression stockings. In my opinion as long as he is continuing with compression I'm okay with the stockings or the wraps. 06/27/17-he is here in follow-up evaluation for multiple ulcerations to his bilateral toes. There is some improvement in appearance. He is compliant in wearing his compression stockings with significant improvement in lower extremity edema.  He saw Dr Ola Spurr on 3/8, per his notes they would redraw ESR and CRP; plan to continue antibiotic therapy if these remain elevated. I do not see an ESR or CRP level in Epic. The patient continues to take antibiotics. 07/11/17-He is here in follow-up evaluation for multiple ulcerations to multiple toes bilaterally. He presents with complete epithelialization to the right third toe; there has been no deterioration. He continues on antibiotic therapy. He will follow-up next week 07/18/17-He is here in follow-up evaluation for multiple ulcerations to multiple toes bilaterally. He continues to make improvement. He continues on antibiotic therapy. He states he has been using something additional to our orders, he does [name], [name] ([id number]) not elaborate but states he will bring it in next week. 07/25/17-He is here in follow up evaluation for multiple ulcerations to bilateral toes. He is stable. He has completed antibiotic therapy. He admits to "filing" his toes after showers each evening, this is what he was referencing last week; he does not filing for the wounds. We will switch to [facility] and monitor for any improvement, he will follow-up next week 08/01/17-He is here in follow-up evaluation. He admits to "picking" at his toes after cleansing yesterday, leading to new areas of tissue loss on the bilateral second toe. There is improvement noted to the bilateral great toe. We will dress toes today and hope that they maintain until Monday where he will come in for a nurse visit. He has been advised, multiple times with expressed verbalization, to change the dressings to silvercel if the dressings get wet prior to [facility] appointment. 08/08/17-He is here in follow-up evaluation for bilateral first and second toe ulcerations. There is significant improvement to all ulcerations since last visit. We will switch to [facility] to all wounds and he will continue with nurse visits on  a Monday/Thursday schedule and follow-up with me in 2 weeks. He continues to smoke, 1-3 cigarettes per day, and has been encouraged to not smoke until his next follow-up in 2 weeks. 08/15/17 on evaluation today patient's ulcers on his toes actually appear to be doing fairly well the right to ulcers may be a little bit more moist compared to the left I'm not really sure exactly why  as the openings appear to be very small and I'm not seeing any evidence of anything significant as far as you lightest or otherwise. Nonetheless this may just be a small setback he's been doing very well with the [facility] Dressing 08/22/17-He is here in follow-up evaluation for ulcerations to his bilateral first and second toes, there is small/scant amount of drainage noted on today's dressing. He continues with Hydrofera Blue. He continues to smoke, 1 cigarette a day. Voices no complaint or concerns, compliant and compression therapy today. He'll follow-up next week 08/29/17-He is here in follow-up evaluation for ulcerations to his bilateral first and second toes, with a new wound to the left third toe. He states he cut himself while cutting the toenail. He presents today with more maceration and increased measurements; waxing and waning measurements and moisture has been an ongoing issue. He continues to smoke a proximally 1 cigarette per day, but states he has not smoked since Sunday. We will initiate medihoney daily and evaluate next week. He presents today without compression stockings 09/05/17-He is here in follow-up evaluation for ulcerations to bilateral first and second and left third toe. He did not pick up the medihoney and therefore has not been changing his dressing. He admits to inconsistent where of compression stockings, admits to pain to his bilateral lower extremities with unilateral edema (right greater than left). The ulcerations to multiple toes are more macerated and larger in size than last week.  He is wearing compression therapy today. He admits to an overall feeling of weakness and fatigue and has been encouraged to contact his PCP for evaluation; he has a known history of anemia and intraabdominal lymphadenopathy. We will return to silvercel and he will follow up next week 09/19/17-He is here in follow-up evaluation for ulcerations to bilateral first and second toe.he was unable to make last week's appointment. The culture that was obtained on 5/30 grew clindamycin sensitive MRSA; he was initiated on 6/4 but did not start it until 6/7. There is significant improvement in all wounds, healing to left second and third toe. He also admits to having no alcohol or smoking for the last 6 days. We will continue with same treatment plan I will extend the clindamycin for an additional 10 days and he will follow-up next week. 09/26/17- He is here in follow evaluation for multiple ulcerations to multiple toes bilaterally. He continues to be non-compliant with dressing, compression therapy. He states he is taking the antibiotics as prescribed, although his recall is inconsistent. There is noted deterioration in all ulcers. We will continue same treatment plan, antibiotic therapy and he will follow up next week. 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 This information is obtained from the medical record, as he cannot articulate specifics to his diagnosis or treatment plan. 10/17/17-He is here in follow-up evaluation for multiple ulcerations to multiple toes bilaterally. There is chronic waxing and waning improvement/deterioration. On today's exam they appear improved, dry without maceration. He continues to apply a variety of topical agents, stating that he has switched between the blue product and silver product. In my opinion,  this will be a chronic waxing and waning giving his intermittent compliance. He will follow-up in 2 weeks. As it relates to the biopsy taken from his buttock, he has not followed up with that and has  not started radiation therapy. 10/31/17-He is here for evaluation for multiple ulcerations to multiple toes bilaterally. Continues with waxing and waning improvement versus deterioration. They are stable on today's exam without significant maceration. 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 We will continue with every 2 week follow-up appointments 11/14/17- He is seen in follow-up evaluation for multiple ulcerations to multiple bilateral toes. He is scheduled for excision of basal cell carcinoma to his right buttock on Monday 8/12. He continues to have chronic waxing and waning of his ulcers; today is a stable day with minimal drainage and left ear is better than right toes. I will recount to infectious disease regarding chronic suppressive therapy. He admits to being more compliant with compression stockings, although he is not wearing any today. He has been encouraged to continue with dressing changes as ordered and compression therapy daily. He will be seen in 2 weeks 11/28/17-He is seen in follow up evaluation for multiple ulcerations to multiple toes bilaterally. He had an overnight admission at [facility] (8/11-8/12) for symptomatic anemia, received two units prbc. His surgical date has been moved to 9/94 excision of basal cell carcinoma to the right buttock. He has been noncompliant in wearing compression stockings, applying topical treatment as ordered; he has been applying a "cream", Hydrofera Blue, silvercel. He is currently on no antibiotic Judy, [name] E. ([id number]) therapy; we will send for re-referral to ID for suppressive therapy given his multiple co-morbidities, poor surgical candidate, unwilling to have toe amputation and general  non-adherence. 12/19/17 on evaluation today patient actually appears to be doing very well in regard to his toes at least compared to last time I saw him. He again has been coming to our office for quite a bit of time. 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 He states is hurting a little bit but doing well in general. I'm see were not taking care of this area dermatology is. He does have his appointment with infectious disease upcoming which they re-consult for suppressive therapy that's next Monday. Patient History Information obtained from Patient. Social History Current every day smoker, Marital Status - Widowed, Alcohol Use - Daily - quit drinking about a week ago, Drug Use - No History, Caffeine Use - Moderate. 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 Respiratory The patient has no complaints or symptoms. Cardiovascular Complains or has symptoms of LE edema. Psychiatric The patient has no complaints or symptoms. Objective Constitutional Well-nourished and well-hydrated in no acute distress. Vitals Time Taken: 8:30 AM, Height: 69 in, Weight: 168 lbs, BMI: 24.8, Temperature: 98.2 F, Pulse: 62 bpm, Respiratory Rate: 16 breaths/min, Blood Pressure: 187/86 mmHg. Respiratory normal breathing without difficulty. clear to auscultation bilaterally. Cardiovascular regular rate and rhythm with normal S1, S2. Psychiatric this patient is able to make decisions and demonstrates good insight into disease process. Alert and Oriented x 3. pleasant and cooperative. [name], [name] ([id number]) General Notes: Patient's toes actually appear to be doing fairly well there's not much in the way of maceration  there are still some skin breakdown areas unfortunately noted at this point. Nonetheless I do feel like he needs to put his compression stockings which one turned help with his toes as well and that it  will keep the fluid from building up in his lower extremities in general. He did not have those on today. He tells me that he will go home and put them on when he leaves here. I told him I would believe that when I see it. Integumentary (Hair, Skin) Wound #1 status is Open. Original cause of wound was Gradually Appeared. The wound is located on the Right Toe Great. The wound measures 0.2cm length x 1cm width x 0.1cm depth; 0.157cm^2 area and 0.016cm^3 volume. There is Fat Layer (Subcutaneous Tissue) Exposed exposed. There is no tunneling or undermining noted. There is a small amount of serous drainage noted. The wound margin is flat and intact. There is large (67-100%) pink granulation within the wound bed. There is no necrotic tissue within the wound bed. The periwound skin appearance did not exhibit: Callus, Crepitus, Excoriation, Induration, Rash, Scarring, Dry/Scaly, Maceration, Atrophie Blanche, Cyanosis, Ecchymosis, Hemosiderin Staining, Mottled, Pallor, Rubor, Erythema. Periwound temperature was noted as No Abnormality. Wound #14 status is Open. Original cause of wound was Gradually Appeared. The wound is located on the Left Toe Second. The wound measures 0.5cm length x 0.4cm width x 0.1cm depth; 0.157cm^2 area and 0.016cm^3 volume. There is no tunneling or undermining noted. There is a medium amount of serous drainage noted. The wound margin is flat and intact. There is large (67-100%) pink granulation within the wound bed. There is no necrotic tissue within the wound bed. The periwound skin appearance did not exhibit: Callus, Crepitus, Excoriation, Induration, Rash, Scarring, Dry/Scaly, Maceration, Atrophie Blanche, Cyanosis, Ecchymosis, Hemosiderin Staining, Mottled, Pallor, Rubor,  Erythema. Periwound temperature was noted as No Abnormality. Wound #16 status is Open. Original cause of wound was Not Known. The wound is located on the [employer]. The wound measures 0.2cm length x 0.9cm width x 0.1cm depth; 0.141cm^2 area and 0.014cm^3 volume. There is Fat Layer (Subcutaneous Tissue) Exposed exposed. There is no tunneling or undermining noted. There is a medium amount of serous drainage noted. The wound margin is flat and intact. There is large (67-100%) pink granulation within the wound bed. There is no necrotic tissue within the wound bed. The periwound skin appearance did not exhibit: Callus, Crepitus, Excoriation, Induration, Rash, Scarring, Dry/Scaly, Maceration, Atrophie Blanche, Cyanosis, Ecchymosis, Hemosiderin Staining, Mottled, Pallor, Rubor, Erythema. Periwound temperature was noted as No Abnormality. Wound #2 status is Open. Original cause of wound was Gradually Appeared. The wound is located on the Right Toe Second. The wound measures 0.1cm length x 0.1cm width x 0.1cm depth; 0.008cm^2 area and 0.001cm^3 volume. There is no tunneling or undermining noted. There is a medium amount of serous drainage noted. The wound margin is flat and intact. There is large (67-100%) pink granulation within the wound bed. There is no necrotic tissue within the wound bed. The periwound skin appearance did not exhibit: Callus, Crepitus, Excoriation, Induration, Rash, Scarring, Dry/Scaly, Maceration, Atrophie Blanche, Cyanosis, Ecchymosis, Hemosiderin Staining, Mottled, Pallor, Rubor, Erythema. Periwound temperature was noted as No Abnormality. Wound #5 status is Open. Original cause of wound was Gradually Appeared. The wound is located on the Left Toe Great. The wound measures 0.5cm length x 0.2cm width x 0.1cm depth; 0.079cm^2 area and 0.008cm^3 volume. There is Fat Layer (Subcutaneous Tissue) Exposed exposed. There is no tunneling or undermining noted. There is a medium  amount of serous drainage noted. The wound margin is flat and intact. There is large (67-100%) pink granulation within the wound bed. There is no necrotic tissue within the wound bed. The  periwound skin appearance did not exhibit: Callus, Crepitus, Excoriation, Induration, Rash, Scarring, Dry/Scaly, Maceration, Atrophie Blanche, Cyanosis, Ecchymosis, Hemosiderin Staining, Mottled, Pallor, Rubor, Erythema. Periwound temperature was noted as No Abnormality. 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 ([id number]) Lymphedema, not elsewhere classified Nicotine dependence, cigarettes, with other nicotine-induced disorders Alcohol abuse with unspecified alcohol-induced disorder Chronic multifocal osteomyelitis, right ankle and foot Plan Wound Cleansing: Wound #1 Right Toe Great: Clean wound with Normal Saline. Wound #14 Left Toe Second: Clean wound with Normal Saline. Wound #16 Right,Plantar Toe Great: Clean wound with Normal Saline. Wound #2 Right Toe Second: Clean wound with Normal Saline. Wound #5 Left Toe Great: Clean wound with Normal Saline. Anesthetic (add to Medication List): Wound #1 Right Toe Great: Topical Lidocaine 4% cream applied to wound bed prior to debridement (In Clinic Only). Wound #14 Left Toe Second: Topical Lidocaine 4% cream applied to wound bed prior to debridement (In Clinic Only). Wound #16 Right,Plantar Toe Great: Topical Lidocaine 4% cream applied to wound bed prior to debridement (In Clinic Only). Wound #2 Right Toe Second: Topical Lidocaine 4% cream applied to wound bed prior to debridement (In Clinic Only). Wound #5 Left Toe Great: Topical Lidocaine 4% cream  applied to wound bed prior to debridement (In Clinic Only). Primary Wound Dressing: Wound #1 Right Toe Great: Silver Alginate Wound #14 Left Toe Second: Silver Alginate Wound #16 Right,Plantar Toe Great: Silver Alginate Wound #2 Right Toe Second: Silver Alginate Wound #5 Left Toe Great: Silver Alginate Secondary Dressing: Wound #1 Right Toe Great: ABD pad Dry Gauze Conform/Kerlix Wound #14 Left Toe Second: ABD pad Dry Gauze Conform/Kerlix Wound #16 Right,Plantar Toe Great: ABD pad Dry Gauze Conform/Kerlix Wound #2 Right Toe Second: Gemma, Ras E. ([id number]) ABD pad Dry Gauze Conform/Kerlix Wound #5 Left Toe Great: ABD pad Dry Gauze Conform/Kerlix Dressing Change Frequency: Wound #1 Right Toe Great: Change dressing every day. Wound #14 Left Toe Second: Change dressing every day. Wound #16 Right,Plantar Toe Great: Change dressing every day. Wound #2 Right Toe Second: Change dressing every day. Wound #5 Left Toe Great: Change dressing every day. Follow-up Appointments: Wound #1 Right Toe Great: Return Appointment in 2 weeks. Wound #14 Left Toe Second: Return Appointment in 2 weeks. Wound #16 Right,Plantar Toe Great: Return Appointment in 2 weeks. Wound #2 Right Toe Second: Return Appointment in 2 weeks. Wound #5 Left Toe Great: Return Appointment in 2 weeks. Edema Control: Wound #1 Right Toe Great: Patient to wear own compression stockings Wound #14 Left Toe Second: Patient to wear own compression stockings Wound #16 Right,Plantar Toe Great: Patient to wear own compression stockings Wound #2 Right Toe Second: Patient to wear own compression stockings Wound #5 Left Toe Great: Patient to wear own compression stockings Additional Orders / Instructions: Wound #1 Right Toe Great: Stop Smoking Increase protein intake. Wound #14 Left Toe Second: Stop Smoking Increase protein intake. Wound #16 Right,Plantar Toe Great: Stop Smoking Increase protein  intake. Wound #2 Right Toe Second: Stop Smoking Increase protein intake. Wound #5 Left Toe Great: Stop Smoking Increase protein intake. HAROLD, MONCUS ([id number]) Patient states at this point that he will start utilizing his compression stockings he finally defined them after moving. We will see if you have these on next  week when I see him for the next evaluation. That will actually be in two weeks time. Please see above for specific wound care orders. We will see patient for re-evaluation in 2 week(s) here in the clinic. If anything worsens or changes patient will contact our office for additional recommendations. Electronic Signature(s) Signed: 12/20/2017 8:54:03 AM By: Worthy Keeler PA-C Entered By: Worthy Keeler on 12/19/2017 09:33:54 Ewings, [name] ([id number]) -------------------------------------------------------------------------------- ROS/PFSH Details Patient Name: All, Christion E. Date of Service: 12/19/2017 8:30 AM Medical Record Number: [id number] Patient Account Number: [id number] Date of Birth/Sex: 11-Dec-1950 (67 y.o. M) Treating RN: Roger Shelter Primary Care Provider: Lamonte Sakai Other Clinician: Referring Provider: Lamonte Sakai Treating Provider/Extender: Melburn Hake, HOYT Weeks in Treatment: 107 Information Obtained From Patient Wound History Do you currently have one or more open woundso Yes How many open wounds do you currently haveo 6 Approximately how long have you had your woundso 3 months How have you been treating your wound(s) until nowo ointment and bandage Has your wound(s) ever healed and then re-openedo No Have you had any lab work done in the past montho No Have you tested positive for an antibiotic resistant organism (MRSA, VRE)o No Have you tested positive for osteomyelitis (bone infection)o No Have you had any tests for circulation on your legso Yes Who ordered the testo PCP Where was the test doneo [facility] Constitutional Symptoms (General  Health) Complaints and Symptoms: Negative for: Fever; Chills Cardiovascular Complaints and Symptoms: Positive for: LE edema Medical History: Positive for: Congestive Heart Failure; Hypertension; Peripheral Venous Disease Negative for: Angina; Arrhythmia; Coronary Artery Disease; Deep Vein Thrombosis; Hypotension; Myocardial Infarction; Peripheral Arterial Disease; Phlebitis; Vasculitis Eyes Medical History: Negative for: Cataracts; Glaucoma; Optic Neuritis Ear/Nose/Mouth/Throat Medical History: Negative for: Chronic sinus problems/congestion; Middle ear problems Hematologic/Lymphatic Medical History: Positive for: Anemia; Lymphedema Negative for: Hemophilia; Human Immunodeficiency Virus; Sickle Cell Disease Respiratory Henk, Vincient E. ([id number]) Complaints and Symptoms: No Complaints or Symptoms Medical History: Negative for: Aspiration; Asthma; Chronic Obstructive Pulmonary Disease (COPD); Pneumothorax; Sleep Apnea; Tuberculosis Gastrointestinal Medical History: Negative for: Cirrhosis ; Colitis; Crohnos; Hepatitis A; Hepatitis B; Hepatitis C Endocrine Medical History: Positive for: Type II Diabetes Treated with: Oral agents Blood sugar tested every day: Yes Tested : QD Genitourinary Medical History: Negative for: End Stage Renal Disease Immunological Medical History: Negative for: Lupus Erythematosus; Raynaudos; Scleroderma Integumentary (Skin) Medical History: Negative for: History of Burn; History of pressure wounds Musculoskeletal Medical History: Negative for: Gout; Rheumatoid Arthritis; Osteoarthritis; Osteomyelitis Neurologic Medical History: Positive for: Neuropathy Negative for: Dementia; Quadriplegia; Paraplegia; Seizure Disorder Oncologic Medical History: Past Medical History Notes: 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 ([id number]) Received Pneumococcal Vaccination: No Immunization Notes: up to date Implantable Devices Family and Social History Current every day smoker; Marital Status - Widowed; Alcohol Use: Daily - quit drinking about a week ago; Drug Use: No History; Caffeine Use: Moderate; Financial Concerns: No; Food, Clothing or Shelter Needs: No; Support System Lacking: No; Transportation Concerns: No; Advanced Directives: No; Patient does not want information on Advanced Directives Physician Affirmation I have reviewed and agree with the above information. Electronic Signature(s) Signed: 12/20/2017 8:12:43 AM By: Roger Shelter Signed: 12/20/2017 8:54:03 AM By: Worthy Keeler PA-C Entered By: Worthy Keeler on 12/19/2017 09:33:05 Brasington, [name] ([id number]) -------------------------------------------------------------------------------- SuperBill Details Patient Name: [place], [place] E. Date of Service: 12/19/2017 Medical Record Number: [id number] Patient Account Number:  [id number] Date of Birth/Sex: [birth year] (67 y.o. M) Treating RN: Roger Shelter Primary Care Provider: Lamonte Sakai Other Clinician: Referring Provider: Lamonte Sakai Treating Provider/Extender: Melburn Hake, HOYT Weeks in Treatment: 64 Diagnosis Coding ICD-10 Codes Code Description L97.521 Non-pressure chronic ulcer of other part of left foot limited to breakdown of skin L97.511 Non-pressure chronic ulcer of other part of right foot limited to breakdown of skin E11.621 Type 2 diabetes mellitus with foot ulcer I87.323 Chronic venous hypertension (idiopathic) with inflammation of bilateral lower extremity I89.0 Lymphedema, not elsewhere classified F17.218 Nicotine dependence, cigarettes, 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 abuse 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, right ankle and foot Facility Procedures CPT4 Code:  69450388 Description: 82800 - WOUND CARE VISIT-LEV 2 EST PT Modifier: Quantity: 1 Physician Procedures CPT4 Code Description: [id number] - WC PHYS LEVEL 3 - EST PT ICD-10 Diagnosis Description L97.521 Non-pressure chronic ulcer of other part of left foot limited to L97.511 Non-pressure chronic ulcer of other part of right foot limited t E11.621 Type 2  diabetes mellitus with foot ulcer I87.323 Chronic venous hypertension (idiopathic) with inflammation of bi Modifier: breakdown of s o breakdown of lateral lower e Quantity: 1 kin skin xtremity Electronic Signature(s) Signed: 12/20/2017 8:54:03 AM By: Worthy Keeler PA-C Entered By: Worthy Keeler on 12/19/2017 09:34:07

## 2017-12-24 NOTE — Progress Notes (Signed)
KENNER, LEWAN ([id number]) Visit Report for 12/19/2017 Arrival Information Details Patient Name: [name], [name] Date of Service: 12/19/2017 8:30 AM Medical Record Number: [id number] Patient Account Number: [id number] Date of Birth/Sex: Oct 20, 1950 (67 y.o. M) Treating RN: Roger Shelter Primary Care Minal Stuller: Lamonte Sakai Other Clinician: Referring Zaven Klemens: Lamonte Sakai Treating Kendall Arnell/Extender: Melburn Hake, HOYT Weeks in Treatment: 34 Visit Information History Since Last Visit Added or deleted any medications: No Patient Arrived: Ambulatory Any new allergies or adverse reactions: No Arrival Time: 08:28 Had a fall or experienced change in No Accompanied By: brother in activities of daily living that may affect law risk of falls: Transfer Assistance: None Signs or symptoms of abuse/neglect since last visito No Patient Identification Verified: Yes Hospitalized since last visit: No Secondary Verification Process Completed: Yes Implantable device outside of the clinic excluding No Patient Requires Transmission-Based No cellular tissue based products placed in the center Precautions: since last visit: Patient Has Alerts: Yes Has Dressing in Place as Prescribed: Yes Patient Alerts: DMII Pain Present Now: No Electronic Signature(s) Signed: 12/19/2017 1:01:16 PM By: Lorine Bears RCP, RRT, CHT Entered By: Becky Sax, Amado Nash on 12/19/2017 08:30:27 Cimmino, [name] ([id number]) -------------------------------------------------------------------------------- Clinic Level of Care Assessment Details Patient Name: Micheals, Kentley E. Date of Service: 12/19/2017 8:30 AM Medical Record Number: [id number] Patient Account Number: [id number] Date of Birth/Sex: 1950/11/17 (67 y.o. M) Treating RN: Roger Shelter Primary Care Moneisha Vosler: Lamonte Sakai Other Clinician: Referring Macauley Mossberg: Lamonte Sakai Treating Miner Koral/Extender: Melburn Hake, HOYT Weeks in Treatment: 63 Clinic  Level of Care Assessment Items TOOL 4 Quantity Score X - Use when only an EandM is performed on FOLLOW-UP visit 1 0 ASSESSMENTS - Nursing Assessment / Reassessment X - Reassessment of Co-morbidities (includes updates in patient status) 1 10 X- 1 5 Reassessment of Adherence to Treatment Plan ASSESSMENTS - Wound and Skin Assessment / Reassessment X - Simple Wound Assessment / Reassessment - one wound 1 5 []  - 0 Complex Wound Assessment / Reassessment - multiple wounds []  - 0 Dermatologic / Skin Assessment (not related to wound area) ASSESSMENTS - Focused Assessment []  - Circumferential Edema Measurements - multi extremities 0 []  - 0 Nutritional Assessment / Counseling / Intervention []  - 0 Lower Extremity Assessment (monofilament, tuning fork, pulses) []  - 0 Peripheral Arterial Disease Assessment (using hand held doppler) ASSESSMENTS - Ostomy and/or Continence Assessment and Care []  - Incontinence Assessment and Management 0 []  - 0 Ostomy Care Assessment and Management (repouching, etc.) PROCESS - Coordination of Care X - Simple Patient / Family Education for ongoing care 1 15 []  - 0 Complex (extensive) Patient / Family Education for ongoing care []  - 0 Staff obtains Programmer, systems, Records, Test Results / Process Orders []  - 0 Staff telephones HHA, Nursing Homes / Clarify orders / etc []  - 0 Routine Transfer to another Facility (non-emergent condition) []  - 0 Routine Hospital Admission (non-emergent condition) []  - 0 New Admissions / Biomedical engineer / Ordering NPWT, Apligraf, etc. []  - 0 Emergency Hospital Admission (emergent condition) X- 1 10 Simple Discharge Coordination Fayette, Conner E. ([id number]) []  - 0 Complex (extensive) Discharge Coordination PROCESS - Special Needs []  - Pediatric / Minor Patient Management 0 []  - 0 Isolation Patient Management []  - 0 Hearing / Language / Visual special needs []  - 0 Assessment of Community assistance (transportation, D/C  planning, etc.) []  - 0 Additional assistance / Altered mentation []  - 0 Support Surface(s) Assessment (bed, cushion, seat, etc.) INTERVENTIONS - Wound Cleansing / Measurement X - Simple Wound  Cleansing - one wound 1 5 []  - 0 Complex Wound Cleansing - multiple wounds X- 1 5 Wound Imaging (photographs - any number of wounds) []  - 0 Wound Tracing (instead of photographs) X- 1 5 Simple Wound Measurement - one wound []  - 0 Complex Wound Measurement - multiple wounds INTERVENTIONS - Wound Dressings X - Small Wound Dressing one or multiple wounds 1 10 []  - 0 Medium Wound Dressing one or multiple wounds []  - 0 Large Wound Dressing one or multiple wounds []  - 0 Application of Medications - topical []  - 0 Application of Medications - injection INTERVENTIONS - Miscellaneous []  - External ear exam 0 []  - 0 Specimen Collection (cultures, biopsies, blood, body fluids, etc.) []  - 0 Specimen(s) / Culture(s) sent or taken to Lab for analysis []  - 0 Patient Transfer (multiple staff / Civil Service fast streamer / Similar devices) []  - 0 Simple Staple / Suture removal (25 or less) []  - 0 Complex Staple / Suture removal (26 or more) []  - 0 Hypo / Hyperglycemic Management (close monitor of Blood Glucose) []  - 0 Ankle / Brachial Index (ABI) - do not check if billed separately X- 1 5 Vital Signs Date, Tracie E. ([id number]) Has the patient been seen at the hospital within the last three years: Yes Total Score: 75 Level Of Care: New/Established - Level 2 Electronic Signature(s) Signed: 12/20/2017 8:12:43 AM By: Roger Shelter Entered By: Roger Shelter on 12/19/2017 09:32:20 Geimer, [name] ([id number]) -------------------------------------------------------------------------------- Encounter Discharge Information Details Patient Name: Malesky, Caydin E. Date of Service: 12/19/2017 8:30 AM Medical Record Number: [id number] Patient Account Number: [id number] Date of Birth/Sex: October 08, 1950 (67 y.o.  M) Treating RN: Montey Hora Primary Care Kuper Rennels: Lamonte Sakai Other Clinician: Referring Kyrell Ruacho: Lamonte Sakai Treating Marte Celani/Extender: Melburn Hake, HOYT Weeks in Treatment: 66 Encounter Discharge Information Items Discharge Condition: Stable Ambulatory Status: Ambulatory Discharge Destination: Home Transportation: Private Auto Accompanied By: brother Schedule Follow-up Appointment: Yes Clinical Summary of Care: Electronic Signature(s) Signed: 12/19/2017 9:50:02 AM By: Montey Hora Entered By: Montey Hora on 12/19/2017 09:50:02 Depace, [name] ([id number]) -------------------------------------------------------------------------------- Lower Extremity Assessment Details Patient Name: Bruns, Helmer E. Date of Service: 12/19/2017 8:30 AM Medical Record Number: [id number] Patient Account Number: [id number] Date of Birth/Sex: 02-18-1951 (67 y.o. M) Treating RN: Montey Hora Primary Care Jeanine Caven: Lamonte Sakai Other Clinician: Referring Chelsae Zanella: Lamonte Sakai Treating Vali Capano/Extender: Melburn Hake, HOYT Weeks in Treatment: 64 Edema Assessment Assessed: [Left: No] [Right: No] [Left: Edema] [Right: :] Calf Left: Right: Point of Measurement: 34 cm From Medial Instep 35.5 cm 36.4 cm Ankle Left: Right: Point of Measurement: 12 cm From Medial Instep 24.1 cm 24.4 cm Vascular Assessment Pulses: Dorsalis Pedis Palpable: [Left:Yes] [Right:Yes] Posterior Tibial Extremity colors, hair growth, and conditions: Extremity Color: [Left:Hyperpigmented] [Right:Hyperpigmented] Hair Growth on Extremity: [Left:No] [Right:No] Temperature of Extremity: [Left:Warm] [Right:Warm] Capillary Refill: [Left:< 3 seconds] [Right:< 3 seconds] Toe Nail Assessment Left: Right: Thick: Yes Yes Discolored: No No Deformed: No No Improper Length and Hygiene: No No Electronic Signature(s) Signed: 12/19/2017 5:06:40 PM By: Montey Hora Entered By: Montey Hora on 12/19/2017 08:47:13 Haverland, [name] ([id number]) -------------------------------------------------------------------------------- Multi Wound Chart Details Patient Name: Estrada, Jamelle E. Date of Service: 12/19/2017 8:30 AM Medical Record Number: [id number] Patient Account Number: [id number] Date of Birth/Sex: Apr 12, 1950 (67 y.o. M) Treating RN: Roger Shelter Primary Care Bartlett Enke: Lamonte Sakai Other Clinician: Referring Lizzy Hamre: Lamonte Sakai Treating Caliber Landess/Extender: Melburn Hake, HOYT Weeks in Treatment: 63 Vital Signs Height(in): 69 Pulse(bpm): 62 Weight(lbs): 168 Blood Pressure(mmHg): 187/86 Body Mass Index(BMI): 25  Temperature(F): 98.2 Respiratory Rate 16 (breaths/min): Photos: [1:No Photos] [14:No Photos] [16:No Photos] Wound Location: [1:Right Toe Great] [14:Left Toe Second] [16:Right Toe Great - Plantar] Wounding Event: [1:Gradually Appeared] [14:Gradually Appeared] [16:Not Known] 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] Comorbid History: [1:Anemia, Lymphedema, Congestive Heart Failure, Hypertension, Peripheral Venous Disease, Type II Diabetes, Neuropathy] [14:Anemia, Lymphedema, Congestive Heart Failure, Hypertension, Peripheral Venous Disease, Type II Diabetes,  Neuropathy] [16:Anemia, Lymphedema, Congestive Heart Failure, Hypertension, Peripheral Venous Disease, Type II Diabetes, Neuropathy] Date Acquired: [1:06/11/2016] [14:09/26/2017] [16:10/29/2017] Weeks of Treatment: [1:64] [14:12] [16:7] Wound Status: [1:Open] [14:Open] [16:Open] Pending Amputation on [1:Yes] [14:No] [16:No] Presentation: Measurements L x W x D [1:0.2x1x0.1] [14:0.5x0.4x0.1] [16:0.2x0.9x0.1] (cm) Area (cm) : [1:0.157] [14:0.157] [16:0.141] Volume (cm) : [1:0.016] [14:0.016] [16:0.014] % Reduction in Area: [1:99.70%] [14:93.30%] [16:91.20%] % Reduction in Volume: [1:99.70%] [14:93.20%]  [16:91.30%] Classification: [1:Grade 2] [14:Grade 1] [16:Grade 1] Exudate Amount: [1:Small] [14:Medium] [16:Medium] Exudate Type: [1:Serous] [14:Serous] [16:Serous] Exudate Color: [1:amber] [14:amber] [16:amber] Wound Margin: [1:Flat and Intact] [14:Flat and Intact] [16:Flat and Intact] Granulation Amount: [1:Large (67-100%)] [14:Large (67-100%)] [16:Large (67-100%)] Granulation Quality: [1:Pink] [14:Pink] [16:Pink] Necrotic Amount: [1:None Present (0%)] [14:None Present (0%)] [16:None Present (0%)] Exposed Structures: [1:Fat Layer (Subcutaneous Tissue) Exposed: Yes Fascia: No Tendon: No Muscle: No Joint: No Bone: No] [14:Fascia: No Fat Layer (Subcutaneous Tissue) Exposed: No Tendon: No Muscle: No Joint: No Bone: No] [16:Fat Layer (Subcutaneous Tissue) Exposed: Yes  Fascia: No Tendon: No Muscle: No Joint: No Bone: No] Epithelialization: [1:Medium (34-66%)] [14:Medium (34-66%)] [16:Small (1-33%)] Periwound Skin Texture: Excoriation: No Excoriation: No Excoriation: No Induration: No Induration: No Induration: No Callus: No Callus: No Callus: No Crepitus: No Crepitus: No Crepitus: No Rash: No Rash: No Rash: No Scarring: No Scarring: No Scarring: No Periwound Skin Moisture: Maceration: No Maceration: No Maceration: No Dry/Scaly: No Dry/Scaly: No Dry/Scaly: No Periwound Skin Color: Atrophie Blanche: No Atrophie Blanche: No Atrophie Blanche: No Cyanosis: No Cyanosis: No Cyanosis: No Ecchymosis: No Ecchymosis: No Ecchymosis: No Erythema: No Erythema: No Erythema: No Hemosiderin Staining: No Hemosiderin Staining: No Hemosiderin Staining: No Mottled: No Mottled: No Mottled: No Pallor: No Pallor: No Pallor: No Rubor: No Rubor: No Rubor: No Temperature: No Abnormality No Abnormality No Abnormality Tenderness on Palpation: No No No Wound Preparation: Ulcer Cleansing: Ulcer Cleansing: Ulcer Cleansing: Rinsed/Irrigated with Saline Rinsed/Irrigated with Saline  Rinsed/Irrigated with Saline Topical Anesthetic Applied: Topical Anesthetic Applied: Topical Anesthetic Applied: Other: lidocaine 4% Other: lidocaine 4% Other: lidocaine 4% Wound Number: 2 5 N/A Photos: No Photos No Photos N/A Wound Location: Right Toe Second Left Toe Great N/A Wounding Event: Gradually Appeared Gradually Appeared N/A Primary Etiology: Diabetic Wound/Ulcer of the Diabetic Wound/Ulcer of the N/A Lower Extremity Lower Extremity Comorbid History: Anemia, Lymphedema, Anemia, Lymphedema, N/A Congestive Heart Failure, Congestive Heart Failure, Hypertension, Peripheral Hypertension, Peripheral Venous Disease, Type II Venous Disease, Type II Diabetes, Neuropathy Diabetes, Neuropathy Date Acquired: 06/11/2016 06/11/2016 N/A Weeks of Treatment: 64 64 N/A Wound Status: Open Open N/A Pending Amputation on Yes Yes N/A Presentation: Measurements L x W x D 0.1x0.1x0.1 0.5x0.2x0.1 N/A (cm) Area (cm) : 0.008 0.079 N/A Volume (cm) : 0.001 0.008 N/A % Reduction in Area: 99.70% 99.40% N/A % Reduction in Volume: 99.70% 99.40% N/A Classification: Grade 2 Grade 2 N/A Exudate Amount: Medium Medium N/A Exudate Type: Serous Serous N/A Exudate Color: amber amber N/A Wound Margin: Flat and Intact Flat and Intact N/A Granulation Amount: Large (67-100%) Large (67-100%) N/A Granulation Quality:  Pink Pink N/A Necrotic Amount: None Present (0%) None Present (0%) N/A Exposed Structures: Fascia: No Fat Layer (Subcutaneous N/A Fat Layer (Subcutaneous Tissue) Exposed: Yes Tissue) Exposed: No Fascia: No Tendon: No Tendon: No Martensen, Kendrix E. ([id number]) Muscle: No Muscle: No Joint: No Joint: No Bone: No Bone: No Epithelialization: Medium (34-66%) Medium (34-66%) N/A Periwound Skin Texture: Excoriation: No Excoriation: No N/A Induration: No Induration: No Callus: No Callus: No Crepitus: No Crepitus: No Rash: No Rash: No Scarring: No Scarring: No Periwound Skin  Moisture: Maceration: No Maceration: No N/A Dry/Scaly: No Dry/Scaly: No Periwound Skin Color: Atrophie Blanche: No Atrophie Blanche: No N/A Cyanosis: No Cyanosis: No Ecchymosis: No Ecchymosis: No Erythema: No Erythema: No Hemosiderin Staining: No Hemosiderin Staining: No Mottled: No Mottled: No Pallor: No Pallor: No Rubor: No Rubor: No Temperature: No Abnormality No Abnormality N/A Tenderness on Palpation: No No N/A Wound Preparation: Ulcer Cleansing: Ulcer Cleansing: N/A Rinsed/Irrigated with Saline Rinsed/Irrigated with Saline Topical Anesthetic Applied: Topical Anesthetic Applied: Other: lidocaine 4% Other: lidocaine 4% Treatment Notes Electronic Signature(s) Signed: 12/20/2017 8:12:43 AM By: Roger Shelter Entered By: Roger Shelter on 12/19/2017 09:28:53 Midgett, [name] ([id number]) -------------------------------------------------------------------------------- [facility] Details Patient Name: Damron, Micah E. Date of Service: 12/19/2017 8:30 AM Medical Record Number: [id number] Patient Account Number: [id number] Date of Birth/Sex: [birth year] (67 y.o. M) Treating RN: Roger Shelter Primary Care Tayva Easterday: Lamonte Sakai Other Clinician: Referring Journii Nierman: Lamonte Sakai Treating Ryane Konieczny/Extender: Melburn Hake, HOYT Weeks in Treatment: 56 Active Inactive ` Abuse / Safety / Falls / Self Care Management Nursing Diagnoses: Potential for falls Goals: Patient will remain injury free related to falls Date Initiated: 09/21/2016 Target Resolution Date: 09/14/2017 Goal Status: Active Interventions: Assess fall risk on admission and as needed Notes: ` Nutrition Nursing Diagnoses: Potential for alteratiion in Nutrition/Potential for imbalanced nutrition Goals: Patient/caregiver agrees to and verbalizes understanding of need to use nutritional supplements and/or vitamins as prescribed Date Initiated: 09/21/2016 Target Resolution Date: 09/14/2017 Goal  Status: Active Interventions: Assess patient nutrition upon admission and as needed per policy Notes: ` Orientation to the Wound Care Program Nursing Diagnoses: Knowledge deficit related to the wound healing center program Goals: Patient/caregiver will verbalize understanding of the [facility] Date Initiated: 09/21/2016 Target Resolution Date: 06/15/2017 Goal Status: Active Interventions: DARKKano, Heckmann ([id number]) Provide education on orientation to the wound center Notes: ` Wound/Skin Impairment Nursing Diagnoses: Knowledge deficit related to smoking impact on wound healing Goals: Ulcer/skin breakdown will have a volume reduction of 30% by week 4 Date Initiated: 09/21/2016 Target Resolution Date: 08/17/2017 Goal Status: Active Ulcer/skin breakdown will have a volume reduction of 50% by week 8 Date Initiated: 09/21/2016 Target Resolution Date: 08/17/2017 Goal Status: Active Ulcer/skin breakdown will have a volume reduction of 80% by week 12 Date Initiated: 09/21/2016 Target Resolution Date: 09/14/2017 Goal Status: Active Ulcer/skin breakdown will heal within 14 weeks Date Initiated: 09/21/2016 Target Resolution Date: 08/17/2017 Goal Status: Active Interventions: Assess patient/caregiver ability to obtain necessary supplies Assess patient/caregiver ability to perform ulcer/skin care regimen upon admission and as needed Assess ulceration(s) every visit Notes: Electronic Signature(s) Signed: 12/20/2017 8:12:43 AM By: Roger Shelter Entered By: Roger Shelter on 12/19/2017 09:28:42 Ousley, [name] ([id number]) -------------------------------------------------------------------------------- Pain Assessment Details Patient Name: Hilleary, Salman E. Date of Service: 12/19/2017 8:30 AM Medical Record Number: [id number] Patient Account Number: [id number] Date of Birth/Sex: [birth year] (67 y.o. M) Treating RN: Roger Shelter Primary Care Ashby Moskal: Lamonte Sakai Other  Clinician: Referring Amalee Olsen: Lamonte Sakai Treating Omaira Mellen/Extender: Melburn Hake, HOYT Weeks in  Treatment: 64 Active Problems Location of Pain Severity and Description of Pain Patient Has Paino No Site Locations Pain Management and Medication Current Pain Management: Electronic Signature(s) Signed: 12/19/2017 1:01:16 PM By: Lorine Bears RCP, RRT, CHT Signed: 12/20/2017 8:12:43 AM By: Roger Shelter Entered By: Lorine Bears on 12/19/2017 08:30:35 Malanowski, [name] ([id number]) -------------------------------------------------------------------------------- Patient/Caregiver Education Details Patient Name: Matchett, Delsin E. Date of Service: 12/19/2017 8:30 AM Medical Record Number: [id number] Patient Account Number: [id number] Date of Birth/Gender: [birth year] (67 y.o. M) Treating RN: Montey Hora Primary Care Physician: Lamonte Sakai Other Clinician: Referring Physician: Lamonte Sakai Treating Physician/Extender: Sharalyn Ink in Treatment: 10 Education Assessment Education Provided To: Patient and Caregiver Education Topics Provided Wound/Skin Impairment: Handouts: Other: wound care as ordered Methods: Demonstration, Explain/Verbal Responses: State content correctly Electronic Signature(s) Signed: 12/19/2017 5:06:40 PM By: Montey Hora Entered By: Montey Hora on 12/19/2017 09:50:22 Waide, [name] ([id number]) -------------------------------------------------------------------------------- Wound Assessment Details Patient Name: Vaeth, Yordan E. Date of Service: 12/19/2017 8:30 AM Medical Record Number: [id number] Patient Account Number: [id number] Date of Birth/Sex: 06/07/1950 (67 y.o. M) Treating RN: Montey Hora Primary Care Mikayla Chiusano: Lamonte Sakai Other Clinician: Referring Felisa Zechman: Lamonte Sakai Treating Jameya Pontiff/Extender: Melburn Hake, HOYT Weeks in Treatment: 61 Wound Status Wound Number: 1 Primary Diabetic Wound/Ulcer of the Lower  Extremity Etiology: Wound Location: Right Toe Great Wound Open Wounding Event: Gradually Appeared Status: Date Acquired: 06/11/2016 Comorbid Anemia, Lymphedema, Congestive Heart Weeks Of Treatment: 64 History: Failure, Hypertension, Peripheral Venous Clustered Wound: No Disease, Type II Diabetes, Neuropathy Pending Amputation On Presentation Photos Photo Uploaded By: Montey Hora on 12/19/2017 11:47:56 Wound Measurements Length: (cm) 0.2 Width: (cm) 1 Depth: (cm) 0.1 Area: (cm) 0.157 Volume: (cm) 0.016 % Reduction in Area: 99.7% % Reduction in Volume: 99.7% Epithelialization: Medium (34-66%) Tunneling: No Undermining: No Wound Description Classification: Grade 2 Wound Margin: Flat and Intact Exudate Amount: Small Exudate Type: Serous Exudate Color: amber Foul Odor After Cleansing: No Slough/Fibrino No Wound Bed Granulation Amount: Large (67-100%) Exposed Structure Granulation Quality: Pink Fascia Exposed: No Necrotic Amount: None Present (0%) Fat Layer (Subcutaneous Tissue) Exposed: Yes Tendon Exposed: No Muscle Exposed: No Joint Exposed: No Bone Exposed: No Periwound Skin Texture Hunke, Marcelus E. ([id number]) Texture Color No Abnormalities Noted: No No Abnormalities Noted: No Callus: No Atrophie Blanche: No Crepitus: No Cyanosis: No Excoriation: No Ecchymosis: No Induration: No Erythema: No Rash: No Hemosiderin Staining: No Scarring: No Mottled: No Pallor: No Moisture Rubor: No No Abnormalities Noted: No Dry / Scaly: No Temperature / Pain Maceration: No Temperature: No Abnormality Wound Preparation Ulcer Cleansing: Rinsed/Irrigated with Saline Topical Anesthetic Applied: Other: lidocaine 4%, Treatment Notes Wound #1 (Right Toe Great) 1. Cleansed with: Clean wound with Normal Saline 2. Anesthetic Topical Lidocaine 4% cream to wound bed prior to debridement 4. Dressing Applied: Calcium Alginate with Silver Other dressing (specify in  notes) 5. Secondary Dressing Applied ABD Pad Kerlix/Conform 7. Secured with Tape Notes silvercel Electronic Signature(s) Signed: 12/19/2017 5:06:40 PM By: Montey Hora Entered By: Montey Hora on 12/19/2017 08:45:41 Bebout, [name] ([id number]) -------------------------------------------------------------------------------- Wound Assessment Details Patient Name: Drummonds, Dugan E. Date of Service: 12/19/2017 8:30 AM Medical Record Number: [id number] Patient Account Number: [id number] Date of Birth/Sex: 1950-06-15 (67 y.o. M) Treating RN: Montey Hora Primary Care Barak Bialecki: Lamonte Sakai Other Clinician: Referring Tazia Illescas: Lamonte Sakai Treating Airen Stiehl/Extender: Melburn Hake, HOYT Weeks in Treatment: 62 Wound Status Wound Number: 14 Primary Diabetic Wound/Ulcer of the Lower Extremity Etiology: Wound Location: Left Toe Second Wound Open Wounding Event: Gradually Appeared  Status: Date Acquired: 09/26/2017 Comorbid Anemia, Lymphedema, Congestive Heart Weeks Of Treatment: 12 History: Failure, Hypertension, Peripheral Venous Clustered Wound: No Disease, Type II Diabetes, Neuropathy Photos Photo Uploaded By: Montey Hora on 12/19/2017 11:47:56 Wound Measurements Length: (cm) 0.5 Width: (cm) 0.4 Depth: (cm) 0.1 Area: (cm) 0.157 Volume: (cm) 0.016 % Reduction in Area: 93.3% % Reduction in Volume: 93.2% Epithelialization: Medium (34-66%) Tunneling: No Undermining: No Wound Description Classification: Grade 1 Wound Margin: Flat and Intact Exudate Amount: Medium Exudate Type: Serous Exudate Color: amber Foul Odor After Cleansing: No Slough/Fibrino No Wound Bed Granulation Amount: Large (67-100%) Exposed Structure Granulation Quality: Pink Fascia Exposed: No Necrotic Amount: None Present (0%) Fat Layer (Subcutaneous Tissue) Exposed: No Tendon Exposed: No Muscle Exposed: No Joint Exposed: No Bone Exposed: No Periwound Skin Texture Caltagirone, Dael E.  ([id number]) Texture Color No Abnormalities Noted: No No Abnormalities Noted: No Callus: No Atrophie Blanche: No Crepitus: No Cyanosis: No Excoriation: No Ecchymosis: No Induration: No Erythema: No Rash: No Hemosiderin Staining: No Scarring: No Mottled: No Pallor: No Moisture Rubor: No No Abnormalities Noted: No Dry / Scaly: No Temperature / Pain Maceration: No Temperature: No Abnormality Wound Preparation Ulcer Cleansing: Rinsed/Irrigated with Saline Topical Anesthetic Applied: Other: lidocaine 4%, Treatment Notes Wound #14 (Left Toe Second) 1. Cleansed with: Clean wound with Normal Saline 2. Anesthetic Topical Lidocaine 4% cream to wound bed prior to debridement 4. Dressing Applied: Calcium Alginate with Silver Other dressing (specify in notes) 5. Secondary Dressing Applied ABD Pad Kerlix/Conform 7. Secured with Tape Notes silvercel Electronic Signature(s) Signed: 12/19/2017 5:06:40 PM By: Montey Hora Entered By: Montey Hora on 12/19/2017 08:45:52 Renderos, [name] ([id number]) -------------------------------------------------------------------------------- Wound Assessment Details Patient Name: Jimerson, Shayaan E. Date of Service: 12/19/2017 8:30 AM Medical Record Number: [id number] Patient Account Number: [id number] Date of Birth/Sex: [birth year] (67 y.o. M) Treating RN: Montey Hora Primary Care Catherene Kaleta: Lamonte Sakai Other Clinician: Referring Patsy Zaragoza: Lamonte Sakai Treating Chavela Justiniano/Extender: Melburn Hake, HOYT Weeks in Treatment: 36 Wound Status Wound Number: 16 Primary Diabetic Wound/Ulcer of the Lower Extremity Etiology: Wound Location: Right Toe Great - Plantar Wound Open Wounding Event: Not Known Status: Date Acquired: 10/29/2017 Comorbid Anemia, Lymphedema, Congestive Heart Weeks Of Treatment: 7 History: Failure, Hypertension, Peripheral Venous Clustered Wound: No Disease, Type II Diabetes, Neuropathy Photos Photo Uploaded By: Montey Hora  on 12/19/2017 11:48:10 Wound Measurements Length: (cm) 0.2 Width: (cm) 0.9 Depth: (cm) 0.1 Area: (cm) 0.141 Volume: (cm) 0.014 % Reduction in Area: 91.2% % Reduction in Volume: 91.3% Epithelialization: Small (1-33%) Tunneling: No Undermining: No Wound Description Classification: Grade 1 Wound Margin: Flat and Intact Exudate Amount: Medium Exudate Type: Serous Exudate Color: amber Foul Odor After Cleansing: No Slough/Fibrino Yes Wound Bed Granulation Amount: Large (67-100%) Exposed Structure Granulation Quality: Pink Fascia Exposed: No Necrotic Amount: None Present (0%) Fat Layer (Subcutaneous Tissue) Exposed: Yes Tendon Exposed: No Muscle Exposed: No Joint Exposed: No Bone Exposed: No Periwound Skin Texture Palka, Anthone E. ([id number]) Texture Color No Abnormalities Noted: No No Abnormalities Noted: No Callus: No Atrophie Blanche: No Crepitus: No Cyanosis: No Excoriation: No Ecchymosis: No Induration: No Erythema: No Rash: No Hemosiderin Staining: No Scarring: No Mottled: No Pallor: No Moisture Rubor: No No Abnormalities Noted: No Dry / Scaly: No Temperature / Pain Maceration: No Temperature: No Abnormality Wound Preparation Ulcer Cleansing: Rinsed/Irrigated with Saline Topical Anesthetic Applied: Other: lidocaine 4%, Treatment Notes Wound #16 (Right, Plantar Toe Great) 1. Cleansed with: Clean wound with Normal Saline 2. Anesthetic Topical Lidocaine 4% cream to wound bed prior to debridement  4. Dressing Applied: Calcium Alginate with Silver Other dressing (specify in notes) 5. Secondary Dressing Applied ABD Pad Kerlix/Conform 7. Secured with Tape Notes silvercel Electronic Signature(s) Signed: 12/19/2017 5:06:40 PM By: Montey Hora Entered By: Montey Hora on 12/19/2017 08:46:06 Schuermann, [name] ([id number]) -------------------------------------------------------------------------------- Wound Assessment Details Patient Name: Ledyard,  Yerachmiel E. Date of Service: 12/19/2017 8:30 AM Medical Record Number: [id number] Patient Account Number: [id number] Date of Birth/Sex: Mar 11, 1951 (67 y.o. M) Treating RN: Montey Hora Primary Care Shahram Alexopoulos: Lamonte Sakai Other Clinician: Referring Tristin Vandeusen: Lamonte Sakai Treating Kristel Durkee/Extender: Melburn Hake, HOYT Weeks in Treatment: 69 Wound Status Wound Number: 2 Primary Diabetic Wound/Ulcer of the Lower Extremity Etiology: Wound Location: Right Toe Second Wound Open Wounding Event: Gradually Appeared Status: Date Acquired: 06/11/2016 Comorbid Anemia, Lymphedema, Congestive Heart Weeks Of Treatment: 64 History: Failure, Hypertension, Peripheral Venous Clustered Wound: No Disease, Type II Diabetes, Neuropathy Pending Amputation On Presentation Photos Photo Uploaded By: Montey Hora on 12/19/2017 11:48:11 Wound Measurements Length: (cm) 0.1 Width: (cm) 0.1 Depth: (cm) 0.1 Area: (cm) 0.008 Volume: (cm) 0.001 % Reduction in Area: 99.7% % Reduction in Volume: 99.7% Epithelialization: Medium (34-66%) Tunneling: No Undermining: No Wound Description Classification: Grade 2 Wound Margin: Flat and Intact Exudate Amount: Medium Exudate Type: Serous Exudate Color: amber Foul Odor After Cleansing: No Slough/Fibrino No Wound Bed Granulation Amount: Large (67-100%) Exposed Structure Granulation Quality: Pink Fascia Exposed: No Necrotic Amount: None Present (0%) Fat Layer (Subcutaneous Tissue) Exposed: No Tendon Exposed: No Muscle Exposed: No Joint Exposed: No Bone Exposed: No Periwound Skin Texture Jaworski, Jaceion E. ([id number]) Texture Color No Abnormalities Noted: No No Abnormalities Noted: No Callus: No Atrophie Blanche: No Crepitus: No Cyanosis: No Excoriation: No Ecchymosis: No Induration: No Erythema: No Rash: No Hemosiderin Staining: No Scarring: No Mottled: No Pallor: No Moisture Rubor: No No Abnormalities Noted: No Dry / Scaly: No Temperature /  Pain Maceration: No Temperature: No Abnormality Wound Preparation Ulcer Cleansing: Rinsed/Irrigated with Saline Topical Anesthetic Applied: Other: lidocaine 4%, Treatment Notes Wound #2 (Right Toe Second) 1. Cleansed with: Clean wound with Normal Saline 2. Anesthetic Topical Lidocaine 4% cream to wound bed prior to debridement 4. Dressing Applied: Calcium Alginate with Silver Other dressing (specify in notes) 5. Secondary Dressing Applied ABD Pad Kerlix/Conform 7. Secured with Tape Notes silvercel Electronic Signature(s) Signed: 12/19/2017 5:06:40 PM By: Montey Hora Entered By: Montey Hora on 12/19/2017 08:46:19 Honda, [name] ([id number]) -------------------------------------------------------------------------------- Wound Assessment Details Patient Name: Tester, Beren E. Date of Service: 12/19/2017 8:30 AM Medical Record Number: [id number] Patient Account Number: [id number] Date of Birth/Sex: May 10, 1950 (67 y.o. M) Treating RN: Montey Hora Primary Care Jade Burkard: Lamonte Sakai Other Clinician: Referring Brooksie [name]: Lamonte Sakai Treating Reace Breshears/Extender: Melburn Hake, HOYT Weeks in Treatment: 76 Wound Status Wound Number: 5 Primary Diabetic Wound/Ulcer of the Lower Extremity Etiology: Wound Location: Left Toe Great Wound Open Wounding Event: Gradually Appeared Status: Date Acquired: 06/11/2016 Comorbid Anemia, Lymphedema, Congestive Heart Weeks Of Treatment: 64 History: Failure, Hypertension, Peripheral Venous Clustered Wound: No Disease, Type II Diabetes, Neuropathy Pending Amputation On Presentation Photos Photo Uploaded By: Montey Hora on 12/19/2017 11:48:43 Wound Measurements Length: (cm) 0.5 Width: (cm) 0.2 Depth: (cm) 0.1 Area: (cm) 0.079 Volume: (cm) 0.008 % Reduction in Area: 99.4% % Reduction in Volume: 99.4% Epithelialization: Medium (34-66%) Tunneling: No Undermining: No Wound Description Classification: Grade 2 Wound Margin: Flat  and Intact Exudate Amount: Medium Exudate Type: Serous Exudate Color: amber Foul Odor After Cleansing: No Slough/Fibrino No Wound Bed Granulation Amount: Large (67-100%) Exposed Structure Granulation Quality: Pink Fascia  Exposed: No Necrotic Amount: None Present (0%) Fat Layer (Subcutaneous Tissue) Exposed: Yes Tendon Exposed: No Muscle Exposed: No Joint Exposed: No Bone Exposed: No Periwound Skin Texture Arroyo, Hiro E. ([id number]) Texture Color No Abnormalities Noted: No No Abnormalities Noted: No Callus: No Atrophie Blanche: No Crepitus: No Cyanosis: No Excoriation: No Ecchymosis: No Induration: No Erythema: No Rash: No Hemosiderin Staining: No Scarring: No Mottled: No Pallor: No Moisture Rubor: No No Abnormalities Noted: No Dry / Scaly: No Temperature / Pain Maceration: No Temperature: No Abnormality Wound Preparation Ulcer Cleansing: Rinsed/Irrigated with Saline Topical Anesthetic Applied: Other: lidocaine 4%, Treatment Notes Wound #5 (Left Toe Great) 1. Cleansed with: Clean wound with Normal Saline 2. Anesthetic Topical Lidocaine 4% cream to wound bed prior to debridement 4. Dressing Applied: Calcium Alginate with Silver Other dressing (specify in notes) 5. Secondary Dressing Applied ABD Pad Kerlix/Conform 7. Secured with Tape Notes silvercel Electronic Signature(s) Signed: 12/19/2017 5:06:40 PM By: Montey Hora Entered By: Montey Hora on 12/19/2017 08:46:32 Ewings, [name] ([id number]) -------------------------------------------------------------------------------- Vitals Details Patient Name: Lieske, Angelino E. Date of Service: 12/19/2017 8:30 AM Medical Record Number: [id number] Patient Account Number: [id number] Date of Birth/Sex: 07/09/1950 (67 y.o. M) Treating RN: Roger Shelter Primary Care Sonika Levins: Lamonte Sakai Other Clinician: Referring Blaklee Shores: Lamonte Sakai Treating Paticia Moster/Extender: Melburn Hake, HOYT Weeks in Treatment:  44 Vital Signs Time Taken: 08:30 Temperature (F): 98.2 Height (in): 69 Pulse (bpm): 62 Weight (lbs): 168 Respiratory Rate (breaths/min): 16 Body Mass Index (BMI): 24.8 Blood Pressure (mmHg): 187/86 Reference Range: 80 - 120 mg / dl Electronic Signature(s) Signed: 12/19/2017 1:01:16 PM By: Lorine Bears RCP, RRT, CHT Entered By: Lorine Bears on 12/19/2017 08:33:51

## 2017-12-24 NOTE — Patient Instructions (Addendum)
The patient is aware to call back for any questions or new concerns. Resume activities as tolerated. May apply triple antibiotic ointment to area Continue showers

## 2018-01-02 ENCOUNTER — Encounter: Payer: Medicare HMO | Admitting: Physician Assistant

## 2018-01-02 DIAGNOSIS — E11621 Type 2 diabetes mellitus with foot ulcer: Secondary | ICD-10-CM | POA: Diagnosis not present

## 2018-01-05 NOTE — Progress Notes (Signed)
[name], [name] ([id number]) Visit Report for 01/02/2018 Chief Complaint Document Details Patient Name: [name], [name] Date of Service: 01/02/2018 8:30 AM Medical Record Number: [id number] Patient Account Number: [id number] Date of Birth/Sex: [birth year] (67 y.o. M) Treating RN: Roger Shelter Primary Care Provider: Lamonte Sakai Other Clinician: Referring Provider: Lamonte Sakai Treating Provider/Extender: Melburn Hake, HOYT Weeks in Treatment: 24 Information Obtained from: Patient Chief Complaint Patient presents for treatment of an open diabetic ulcer to both feet Electronic Signature(s) Signed: 01/02/2018 3:07:42 PM By: Worthy Keeler PA-C Entered By: Worthy Keeler on 01/02/2018 08:41:29 [name], [name] ([id number]) -------------------------------------------------------------------------------- HPI Details Patient Name: [name], [name] Date of Service: 01/02/2018 8:30 AM Medical Record Number: [id number] Patient Account Number: [id number] Date of Birth/Sex: 1951-01-29 (67 y.o. M) Treating RN: Roger Shelter Primary Care Provider: Lamonte Sakai Other Clinician: Referring Provider: Lamonte Sakai Treating Provider/Extender: Melburn Hake, HOYT Weeks in Treatment: 57 History of Present Illness Location: bilateral feet ulceration on the toes Quality: Patient reports experiencing a dull pain to affected area(s). Severity: Patient states wound are getting better Duration: Patient has had the wound for > 3 months prior to seeking treatment at the wound center Timing: Pain in wound is constant (hurts all the time) Context: The wound would happen gradually Modifying Factors: Other treatment(s) tried include:treatment for lymphedema and is seen by the podiatrist Dr. Caryl Comes Associated Signs and Symptoms: Patient reports having increase swelling. HPI Description: 67 year old patient here to see as for bilateral feet ulceration to on his left first and second toe and 2 on his right first and second  toe, which she's had for about 4 months. 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 Donella Stade. The patient is also undergoing workup by medical oncology for a intra-abdominal lymphadenopathy. 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 He is also status post appendectomy, inguinal lymph node biopsy, rectal biopsy and rectal examination under anesthesia. he currently smokes cigarettes about half packet a day. In March of this year he was seen by Dr. Hortencia Pilar, for evaluation of bilateral varicose veins and besides wearing compression stockings he had recommended laser ablation of the right and left great saphenous veins to eleviate the symptoms and complications of severe superficial venous reflux disease. He also recommended lymphedema pumps for better control of his lymphedema. The patient recently has had on 08/23/2016, right greater saphenous vein ablation with the laser energy Earlier lower extremity venous reflux examination done on 05/08/2016 showed no DVT or SVT both lower legs but incompetence of bilateral great saphenous veins was present. A lower arterial study was also done and there was no significant right lower and left lower extremity problems based on a normal toe brachial index bilaterally and the ABI was 1.21 the left and 1.23 on the right. His post ablation venous duplex examination showed successful ablation of the right GS vein with thrombus formation 2 below the right saphenofemoral junction. The deep system was patent without evidence of thrombosis and this was done on 08/30/2016. the patient also has a squamous cell cancer of the skin of the buttock and is recently undergone radiation therapy for this prior to excisional  surgery. Addendum: regarding his x-rays done today and x-ray of the left foot -- IMPRESSION: No objective evidence of osteomyelitis. There  are soft tissue changes which may reflect cellulitis. X-ray of the right foot -- IMPRESSION:Findings compatible with cellulitis of the toes. No objective evidence of osteomyelitis is observed. 10/01/16 on evaluation today patient's wounds appeared to be doing some better. I did review the x-rays as well which showed no evidence of osteomyelitis although there was evidence on x-ray of cellulitis. He fortunately is not having any discomfort although he continues to have some swelling. He does not remember being on any antibiotics recently. 10/15/16 on evaluation today patient's wounds overall appear to be doing better although he does have a new location noted on the left foot. Fortunately he is not having significant pain. It almost has the appearance that something is rubbing on the end of his toes but he wears the open toe shoes and according to what he is telling me never wears anything that would rub on his foot. There is no evidence of infection and specifically no evidence of a fungal infection 10/22/16 On evaluation today patient's wounds appeared to be doing better compared to last week in regard to his bilateral [name], [name] ([id number]) lower extremities. Fortunately I happy with how things are progressing although he still has ulcers I feel like that he is improving and appropriate manner. 11/12/16 on evaluation today patient appears to be doing well in regard to his bilateral feet and the respective wounds. We have been using surrounding her dressings along with an antifungal cream which seems to be doing very well. He has no bilateral dysfunction noticed that the rituals are weight loss at this point. He also has no nausea or vomiting a note purulent discharge. He did see Vein and vascular today and he tells me that they told him he could have surgery  for his venous stasis but they did not feel like it was worth it in his words. Fortunately patient's wounds do appear to be getting sneakily better. 11/26/2016 -- he says he is going to have some surgery during this week at [facility] for possibly a colon resection. 12/31/2016 -- the patient has been noncompliant with his smoking and I'm not sure whether he is also started drinking again. He continues to be very nonchalant about his care 01/14/2017 -- the patient's HandP has been reviewed well and I understand he is being compliant with trying to give up smoking and his local dressing changes. He does not have any surgical options of 4 to him by his vascular surgeons.he was last seen in early August by Dr. Hortencia Pilar who recommended compression stockings,and possibly lymph pumps in 2-3 months after doing a review ultrasound. 01/28/2017 - the patient did not have any fresh complaints but on examination I noted a large lacerated wound on the plantar aspect of his right fourth toe which had a lot of necrotic debris and it probes down to bone. 02/07/2017 -- x-ray of the right foot -- IMPRESSION: Soft tissue swelling about the first through fourth toes consistent with cellulitis. New destructive change in the tuft of the distal phalanx of the great toe is consistent with osteomyelitis. 02/14/2017 -- the patient's MRI is pending this coming Monday and he still continues to smoke. We have again gone over off loading of his wounds in great detail and he says he's been compliant. 02/21/2017 -- MR of the right foot -- IMPRESSION: 1. Soft tissue ulcer at the tip of the first, second and third toe knows. Cortical irregularity and bone marrow edema in the first distal phalanx most concerning  for osteomyelitis. Mild marrow edema in the second and third distal phalanx without definite cortical destruction which may reflect early osteomyelitis versus reactive marrow edema. 2. Soft tissue edema surrounding  the first phalanx most consistent with cellulitis. the patient was also recently evaluated by his medical oncologist Dr. 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 She is treating him with weekly Procrit. She is also keeping intra-abdominal lymphadenopathy and right lower lobe lung nodule under observation. 04/04/2017 -- he was seen by Dr. Adrian Prows on 03/25/2017 -- after review he empirically put him on ciprofloxacin and doxycycline as they have good bone penetration and good bioavailability and it will cover the usual pathogens and diabetic foot osteomyelitis. He will check inflammatory markers and plan a 20-68 week old records. C-reactive protein was 0.3 and the ESR was 72 04/18/17 on evaluation today patient appears to be doing about the same in regard to his lower extremity wounds bilaterally. He has continued to use the antifungal cream which does seem to be beneficial. Nonetheless the ulcers do seem to in some areas be epithelial eyes over and in other areas are still open. He is having no significant discomfort. 04/25/17-he is here in follow-up evaluation for multiple ulcerations to multiple toes bilaterally. He states he did see Dr. Ola Spurr again last week and continues antibiotic therapy. He is voicing no complaints or concerns, will continue with current treatment plan will possibility of adding compression therapy next week after an additional week of treatment/lotions to BLE prescribed by Dr Ola Spurr 05/02/17 he is here in follow up for for multiple ulcers to multiple toes bilaterally. we will stop using antifungal cream and will continue with silvercel and follow up next week 05/09/17-he is here in follow-up for multiple ulcerations to multiple toes bilaterally. There is improvement in appearance. He has not completely stopped using antifungal cream, but admits he has not using it between the toes. He has an  appointment with Dr. Ola Spurr on 2/11, continues on doxycycline and Cipro. It has been 5 weeks of antibiotic therapy, we will order plain film xray to evaluate for osteomyelitis next week, prior to follow up with ID. Will continue with silvercel and follow up next week 05/16/17-he is here in follow-up evaluation for multiple ulcerations to multiple toes bilaterally and new wound to the right posterior heel. There is essentially no change in appearance, deteriorating measurements; he has a history of waxing and waning measurements. He admits that he continues to apply moisturizer/cream/ointment to his toes despite weekly reminders to only apply silvercel to his toes. He states that he thinks the surgical shoe contributed to the superficial ulcer to his posterior [name], [name] ([id number]) heel, he is unable to articulate if this was an area of dry cracked skin as he has a similar area to the left heel. He now is wearing open toed slippers. He has an appointment with Dr. Ola Spurr on 2/11. We have ordered x-rays for her bilateral feet; he was advised to obtain the x-rays today or tomorrow. He will follow-up next week 05/23/17-he is here in follow-up evaluation for multiple ulcerations to multiple toes bilaterally and the right posterior heel. There is improvement in maceration. He has been compliant and not applying any moisturizing agent to his toes. He has been using Lac-Hydrin for his lower extremities with improvement. He did not go to his appointment on Monday with Dr. Ola Spurr secondary to financial concerns. X-rays for her bilateral feet  showed: LEFT FOOT with slight erosion of the tuft of the distal phalanges of the left first and second toe suspicious for osteomyelitis, RIGHT FOOT with 1.erosion of the tufts of the distal phalanges of the right first second and possibly third toes consistent with osteomyelitis, 2 no definitive abnormality of the calcaneus is seen on the images obtained, 3.  Plantar calcaneal degenerative spur. We briefly discussed hyperbaric adjunctive therapy for treatment of chronic refractory osteomyelitis. I do not find an a1c in EMR, will contact PCP for record, or order if needed. He has been encouraged to contact Dr Ola Spurr office regarding the follow-up appointment, encouraged him to inquire about payment plan. We will continue with same treatment plan and follow-up next week. He states he is still taking antibiotics and has "a lot" left. He states he has been taking them as directed, 2 pills twice daily. According to Dr. Blane Ohara office notes he was originally started on 12/17 for 4 weeks and extended on 1/14 for an additional 4 weeks. He should be done with his antibiotic therapy, he was advised to bring his bottles and to his next appointment, we will contact pharmacy. 05/30/17-he is here in follow-up evaluation for multiple ulcerations to multiple toes bilaterally and the right posterior heel. He is accompanied by his brother-in-law. Wounds are stable. He has yet to make up with Dr. Ola Spurr. We contacted his PCP, with no record of recent A1c we will draw an A1c. His brother-in-law states that he was taken off all of his diabetic medication secondary to kidney function. 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 The antibiotics were counted and have approximately 2 weeks left, although they should be complete. The pill organizer reveals missing days. We discussed the need for consistent medications, to have optimal benefit of medication. He has a cousin that lives with him and he will ask her to check his organizer daily. He has been advised to follow up with Dr Ola Spurr, and will go by the office today. He has been advised to quit smoking. 06/06/17-he is here in follow up evaluation. He has had to make an appointment with Dr. Ola Spurr. He did have  blood work obtained, a1c 5. He continues to take antibiotic therapy. Significant improvement in bilateral lower extremity edema with compression therapy. Essentially no change in ulcerations to toes. He states he is "going to try something different" and "let me know next week" if it works; he would not provide any additional information and was encouraged to follow our orders. We will follow up next week 06/13/17-he is here in follow-up evaluation. He has an appointment with Dr. Ola Spurr tomorrow morning. He states he purchased an ointment from [facility] and applied to his toes for 3 days, he does not remember the name of the ointment. There is improvement to his wounds, minimal maceration. He continues to take antibiotic therapy, this should have been completed last month. His brother-in-law who regularly accompanies his appointments was asked to take the bottles to the appointment tomorrow with Dr. Ola Spurr so he is aware. We will continue with 3 layer compression, and order OPEN TOE compression 20-30mmHg; we will apply compression stockings next week. He continues to smoke, smoked "2 cigarettes" last week 06/20/17 on evaluation today patient did receive his compression stockings which he has with him today for both lower extremities. With that being said he tells me at this point in time that he is very  happy to have these he really is not a big fan of the compression wraps that we have been utilizing although they have been of great benefit for him. Nonetheless at this point he does want to switch to the compression stockings. In my opinion as long as he is continuing with compression I'm okay with the stockings or the wraps. 06/27/17-he is here in follow-up evaluation for multiple ulcerations to his bilateral toes. There is some improvement in appearance. He is compliant in wearing his compression stockings with significant improvement in lower extremity edema. He saw Dr Ola Spurr on 3/8,  per his notes they would redraw ESR and CRP; plan to continue antibiotic therapy if these remain elevated. I do not see an ESR or CRP level in Epic. The patient continues to take antibiotics. 07/11/17-He is here in follow-up evaluation for multiple ulcerations to multiple toes bilaterally. He presents with complete epithelialization to the right third toe; there has been no deterioration. He continues on antibiotic therapy. He will follow-up next week 07/18/17-He is here in follow-up evaluation for multiple ulcerations to multiple toes bilaterally. He continues to make improvement. He continues on antibiotic therapy. He states he has been using something additional to our orders, he does not elaborate but states he will bring it in next week. 07/25/17-He is here in follow up evaluation for multiple ulcerations to bilateral toes. He is stable. He has completed antibiotic therapy. He admits to "filing" his toes after showers each evening, this is what he was referencing last week; he does not filing for the wounds. We will switch to [facility] and monitor for any improvement, he will follow-up next week 08/01/17-He is here in follow-up evaluation. He admits to "picking" at his toes after cleansing yesterday, leading to new areas of tissue loss on the bilateral second toe. There is improvement noted to the bilateral great toe. We will dress toes today and hope that they maintain until Monday where he will come in for a nurse visit. He has been advised, multiple times with expressed verbalization, to change the dressings to silvercel if the dressings get wet prior to [facility] appointment. [name], [name] ([id number]) 08/08/17-He is here in follow-up evaluation for bilateral first and second toe ulcerations. There is significant improvement to all ulcerations since last visit. We will switch to [facility] to all wounds and he will continue with nurse visits on a Monday/Thursday schedule and follow-up  with me in 2 weeks. He continues to smoke, 1-3 cigarettes per day, and has been encouraged to not smoke until his next follow-up in 2 weeks. 08/15/17 on evaluation today patient's ulcers on his toes actually appear to be doing fairly well the right to ulcers may be a little bit more moist compared to the left I'm not really sure exactly why as the openings appear to be very small and I'm not seeing any evidence of anything significant as far as you lightest or otherwise. Nonetheless this may just be a small setback he's been doing very well with the [facility] Dressing 08/22/17-He is here in follow-up evaluation for ulcerations to his bilateral first and second toes, there is small/scant amount of drainage noted on today's dressing. He continues with Hydrofera Blue. He continues to smoke, 1 cigarette a day. Voices no complaint or concerns, compliant and compression therapy today. He'll follow-up next week 08/29/17-He is here in follow-up evaluation for ulcerations to his bilateral first and second toes, with a new wound to the left third toe. He states  he cut himself while cutting the toenail. He presents today with more maceration and increased measurements; waxing and waning measurements and moisture has been an ongoing issue. He continues to smoke a proximally 1 cigarette per day, but states he has not smoked since Sunday. We will initiate medihoney daily and evaluate next week. He presents today without compression stockings 09/05/17-He is here in follow-up evaluation for ulcerations to bilateral first and second and left third toe. He did not pick up the medihoney and therefore has not been changing his dressing. He admits to inconsistent where of compression stockings, admits to pain to his bilateral lower extremities with unilateral edema (right greater than left). The ulcerations to multiple toes are more macerated and larger in size than last week. He is wearing compression therapy today. He  admits to an overall feeling of weakness and fatigue and has been encouraged to contact his PCP for evaluation; he has a known history of anemia and intraabdominal lymphadenopathy. We will return to silvercel and he will follow up next week 09/19/17-He is here in follow-up evaluation for ulcerations to bilateral first and second toe.he was unable to make last week's appointment. The culture that was obtained on 5/30 grew clindamycin sensitive MRSA; he was initiated on 6/4 but did not start it until 6/7. There is significant improvement in all wounds, healing to left second and third toe. He also admits to having no alcohol or smoking for the last 6 days. We will continue with same treatment plan I will extend the clindamycin for an additional 10 days and he will follow-up next week. 09/26/17- He is here in follow evaluation for multiple ulcerations to multiple toes bilaterally. He continues to be non-compliant with dressing, compression therapy. He states he is taking the antibiotics as prescribed, although his recall is inconsistent. There is noted deterioration in all ulcers. We will continue same treatment plan, antibiotic therapy and he will follow up next week. 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 This information is obtained from the medical record, as he cannot articulate specifics to his diagnosis or treatment plan. 10/17/17-He is here in follow-up evaluation for multiple ulcerations to multiple toes bilaterally. There is chronic waxing and waning improvement/deterioration. On today's exam they appear improved, dry without maceration. He continues to apply a variety of topical agents, stating that he has switched between the blue product and silver product. In my opinion, this will be a chronic waxing and waning  giving his intermittent compliance. He will follow-up in 2 weeks. As it relates to the biopsy taken from his buttock, he has not followed up with that and has not started radiation therapy. 10/31/17-He is here for evaluation for multiple ulcerations to multiple toes bilaterally. Continues with waxing and waning improvement versus deterioration. They are stable on today's exam without significant maceration. 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 We will continue with every 2 week follow-up appointments 11/14/17- He is seen in follow-up evaluation for multiple ulcerations to multiple bilateral toes. He is scheduled for excision of basal cell carcinoma to his right buttock on Monday 8/12. He continues to have chronic waxing and waning of his ulcers; today is a stable day with minimal drainage and left ear is better than right toes. I will recount to infectious disease regarding chronic suppressive  therapy. He admits to being more compliant with compression stockings, although he is not wearing any today. He has been encouraged to continue with dressing changes as ordered and compression therapy daily. He will be seen in 2 weeks 11/28/17-He is seen in follow up evaluation for multiple ulcerations to multiple toes bilaterally. He had an overnight admission at [facility] (8/11-8/12) for symptomatic anemia, received two units prbc. His surgical date has been moved to 9/94 excision of basal cell carcinoma to the right buttock. He has been noncompliant in wearing compression stockings, applying topical treatment as ordered; he has been applying a "cream", Hydrofera Blue, silvercel. He is currently on no antibiotic therapy; we will send for re-referral to ID for suppressive therapy given his multiple co-morbidities, poor surgical candidate, unwilling to have toe amputation and general non-adherence. 12/19/17 on evaluation today patient actually appears to be doing very  well in regard to his toes at least compared to last time I saw him. He again has been coming to our office for quite a bit of time. 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 He states is hurting a little bit but doing well in general. I'm see were not taking care of this area dermatology is. He does have his appointment with infectious disease upcoming which they re-consult for suppressive therapy that's next Monday. [name], [name] ([id number]) 01/02/18 on evaluation today patient actually appears to be doing rather well in regard to his toe ulcers although he tells me he has been put in an ointment on this which I am concerned may be causing some additional moisture buildup which we really do not want. He supposed be using silver cell which is mainly thing I want him to be utilizing. Fortunately there does not appear to be any evidence of worsening infection which is good news. He has had surgery on his gluteal region where he had an excision of a cancerous area fortunately that seems to be doing well although he is having some discomfort. Electronic Signature(s) Signed: 01/02/2018 3:07:42 PM By: Worthy Keeler PA-C Entered By: Worthy Keeler on 01/02/2018 09:03:50 [name], [name] ([id number]) -------------------------------------------------------------------------------- Physical Exam Details Patient Name: Sugarman, Jontez E. Date of Service: 01/02/2018 8:30 AM Medical Record Number: [id number] Patient Account Number: [id number] Date of Birth/Sex: [birth year] (67 y.o. M) Treating RN: Roger Shelter Primary Care Provider: Lamonte Sakai Other Clinician: Referring Provider: Lamonte Sakai Treating Provider/Extender: Melburn Hake, HOYT Weeks in Treatment: 76 Constitutional Well-nourished and well-hydrated in no acute distress. Respiratory normal breathing without difficulty. clear to  auscultation bilaterally. Cardiovascular regular rate and rhythm with normal S1, S2. Psychiatric this patient is able to make decisions and demonstrates good insight into disease process. Alert and Oriented x 3. pleasant and cooperative. Notes Patient's wound bed currently on his toes actually show signs again of a little bit more fluid buildup the normal I think this is due to the fact is been put in over-the-counter anabiotic appointment on this which again we have not really advised him to do. I suggested that he not utilize this currently. He understands. We will stick with the silver cell at this point. Electronic Signature(s) Signed: 01/02/2018 3:07:42 PM By: Worthy Keeler PA-C Entered By: Worthy Keeler on 01/02/2018 09:04:27 Shumard, [name] ([id number]) -------------------------------------------------------------------------------- Physician Orders Details Patient Name: Janowicz, [name] Date of Service: 01/02/2018 8:30 AM Medical Record Number: [id number] Patient  Account Number: [id number] Date of Birth/Sex: 03-Aug-1950 (67 y.o. M) Treating RN: Roger Shelter Primary Care Provider: Lamonte Sakai Other Clinician: Referring Provider: Lamonte Sakai Treating Provider/Extender: Melburn Hake, HOYT Weeks in Treatment: 55 Verbal / Phone Orders: No Diagnosis Coding ICD-10 Coding Code Description L97.521 Non-pressure chronic ulcer of other part of left foot limited to breakdown of skin L97.511 Non-pressure chronic ulcer of other part of right foot limited to breakdown of skin E11.621 Type 2 diabetes mellitus with foot ulcer I87.323 Chronic venous hypertension (idiopathic) with inflammation of bilateral lower extremity I89.0 Lymphedema, not elsewhere classified F17.218 Nicotine dependence, cigarettes, 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 abuse 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, right ankle and foot Wound Cleansing Wound #1  Right Toe Great o Clean wound with Normal Saline. Wound #14 Left Toe Second o Clean wound with Normal Saline. Wound #16 Right,Plantar Toe Great o Clean wound with Normal Saline. Wound #2 Right Toe Second o Clean wound with Normal Saline. Wound #5 Left Toe Great o Clean wound with Normal Saline. Anesthetic (add to Medication List) Wound #1 Right Toe Great o Topical Lidocaine 4% cream applied to wound bed prior to debridement (In Clinic Only). Wound #14 Left Toe Second o Topical Lidocaine 4% cream applied to wound bed prior to debridement (In Clinic Only). Wound #16 Right,Plantar Toe Great o Topical Lidocaine 4% cream applied to wound bed prior to debridement (In Clinic Only). Wound #2 Right Toe Second o Topical Lidocaine 4% cream applied to wound bed prior to debridement (In Clinic Only). Wound #5 Left Toe Great o Topical Lidocaine 4% cream applied to wound bed prior to debridement (In Clinic Only). GRYPHON, VANDERVEEN ([id number]) Primary Wound Dressing Wound #1 Right Toe Great o Silver Alginate Wound #14 Left Toe Second o Silver Alginate Wound #16 Right,Plantar Toe Great o Silver Alginate Wound #2 Right Toe Second o Silver Alginate Wound #5 Left Toe Great o Silver Alginate Secondary Dressing Wound #1 Right Toe Great o ABD pad o Dry Gauze o Conform/Kerlix Wound #14 Left Toe Second o ABD pad o Dry Gauze o Conform/Kerlix Wound #16 Right,Plantar Toe Great o ABD pad o Dry Gauze o Conform/Kerlix Wound #2 Right Toe Second o ABD pad o Dry Gauze o Conform/Kerlix Wound #5 Left Toe Great o ABD pad o Dry Gauze o Conform/Kerlix Dressing Change Frequency Wound #1 Right Toe Great o Change dressing every day. Wound #14 Left Toe Second o Change dressing every day. Wound #16 Right,Plantar Toe Great o Change dressing every day. Wound #2 Right Toe Second o Change dressing every day. Wound #5 Left [address], Etienne E. ([id number]) o Change dressing every day. Follow-up Appointments Wound #1 Right Toe Great o Return Appointment in 3 weeks. Wound #14 Left Toe Second o Return Appointment in 3 weeks. Wound #16 Right,Plantar Toe Great o Return Appointment in 3 weeks. Wound #2 Right Toe Second o Return Appointment in 3 weeks. Wound #5 Left Toe Great o Return Appointment in 3 weeks. Edema Control Wound #1 Right Toe Great o Patient to wear own compression stockings Wound #14 Left Toe Second o Patient to wear own compression stockings Wound #16 Right,Plantar Toe Great o Patient to wear own compression stockings Wound #2 Right Toe Second o Patient to wear own compression stockings Wound #5 Left Toe Great o Patient to wear own compression stockings Additional Orders / Instructions Wound #1 Right Toe Great o Stop Smoking o Increase protein intake. Wound #14 Left Toe Second o Stop Smoking   o Increase protein intake. Wound #16 Right,Plantar Toe Great o Stop Smoking o Increase protein intake. Wound #2 Right Toe Second o Stop Smoking o Increase protein intake. Wound #5 Left Toe Great o Stop Smoking o Increase protein intake. [name], [name] ([id number]) Electronic Signature(s) Signed: 01/02/2018 3:07:42 PM By: Worthy Keeler PA-C Signed: 01/03/2018 4:22:48 PM By: Roger Shelter Entered By: Roger Shelter on 01/02/2018 09:01:15 Slyter, [name] ([id number]) -------------------------------------------------------------------------------- Problem List Details Patient Name: Jakel, Susan E. Date of Service: 01/02/2018 8:30 AM Medical Record Number: [id number] Patient Account Number: [id number] Date of Birth/Sex: [birth year] (67 y.o. M) Treating RN: Roger Shelter Primary Care Provider: Lamonte Sakai Other Clinician: Referring Provider: Lamonte Sakai Treating Provider/Extender: Melburn Hake, HOYT Weeks in Treatment: 41 Active  Problems ICD-10 Evaluated Encounter Code Description Active Date Today Diagnosis L97.521 Non-pressure chronic ulcer of other part of left foot limited to 09/21/2016 No Yes breakdown of skin L97.511 Non-pressure chronic ulcer of other part of right foot limited to 09/21/2016 No Yes breakdown of skin E11.621 Type 2 diabetes mellitus with foot ulcer 09/21/2016 No Yes I87.323 Chronic venous hypertension (idiopathic) with inflammation of 09/21/2016 No Yes bilateral lower extremity I89.0 Lymphedema, not elsewhere classified 09/21/2016 No Yes F17.218 Nicotine dependence, cigarettes, with other nicotine-induced 09/21/2016 No Yes disorders F10.19 Alcohol abuse with unspecified alcohol-induced disorder 09/21/2016 No Yes M86.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, right ankle and foot 02/21/2017 No Yes Inactive Problems Resolved Problems Electronic Signature(s) [name], [name] ([id number]) Signed: 01/02/2018 3:07:42 PM By: Worthy Keeler PA-C Entered By: Worthy Keeler on 01/02/2018 08:41:23 [name], [name] ([id number]) -------------------------------------------------------------------------------- Progress Note Details Patient Name: [name], [name] Date of Service: 01/02/2018 8:30 AM Medical Record Number: [id number] Patient Account Number: [id number] Date of Birth/Sex: 09-02-1950 (67 y.o. M) Treating RN: Roger Shelter Primary Care Provider: Lamonte Sakai Other Clinician: Referring Provider: Lamonte Sakai Treating Provider/Extender: Melburn Hake, HOYT Weeks in Treatment: 48 Subjective Chief Complaint Information obtained from Patient Patient presents for treatment of an open diabetic ulcer to both feet History of Present Illness (HPI) The following HPI elements were documented for the patient's wound: Location: bilateral feet ulceration on the toes Quality: Patient reports experiencing a dull pain to affected area(s). Severity: Patient states wound are getting better Duration: Patient has had the  wound for > 3 months prior to seeking treatment at the wound center Timing: Pain in wound is constant (hurts all the time) Context: The wound would happen gradually Modifying Factors: Other treatment(s) tried include:treatment for lymphedema and is seen by the podiatrist Dr. Caryl Comes Associated Signs and Symptoms: Patient reports having increase swelling. 67 year old patient here to see as for bilateral feet ulceration to on his left first and second toe and 2 on his right first and second toe, which she's had for about 4 months. 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 Donella Stade. The patient is also undergoing workup by medical oncology for a intra-abdominal lymphadenopathy. 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 He is also status post appendectomy, inguinal lymph node biopsy, rectal biopsy and rectal examination under anesthesia. he currently smokes cigarettes about half packet a day. In March of this year he was seen by Dr. Hortencia Pilar, for evaluation of bilateral varicose veins and besides wearing compression stockings he had recommended laser ablation  of the right and left great saphenous veins to eleviate the symptoms and complications of severe superficial venous reflux disease. He also recommended lymphedema pumps for better control of his lymphedema. The patient recently has had on 08/23/2016, right greater saphenous vein ablation with the laser energy Earlier lower extremity venous reflux examination done on 05/08/2016 showed no DVT or SVT both lower legs but incompetence of bilateral great saphenous veins was present. A lower arterial study was also done and there was no significant right lower and left lower extremity problems based  on a normal toe brachial index bilaterally and the ABI was 1.21 the left and 1.23 on the right. His post ablation venous duplex examination showed successful ablation of the right GS vein with thrombus formation 2 below the right saphenofemoral junction. The deep system was patent without evidence of thrombosis and this was done on 08/30/2016. 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 Addendum: regarding his x-rays done today and x-ray of the left foot -- IMPRESSION: No objective evidence of osteomyelitis. There are soft tissue changes which may reflect cellulitis. X-ray of the right foot -- IMPRESSION:Findings compatible with cellulitis of the toes. No objective evidence of osteomyelitis is observed. 10/01/16 on evaluation today patient's wounds appeared to be doing some better. I did review the x-rays as well which showed no evidence of osteomyelitis although there was evidence on x-ray of cellulitis. He fortunately is not having any discomfort [name], [name] ([id number]) although he continues to have some swelling. He does not remember being on any antibiotics recently. 10/15/16 on evaluation today patient's wounds overall appear to be doing better although he does have a new location noted on the left foot. Fortunately he is not having significant pain. It almost has the appearance that something is rubbing on the end of his toes but he wears the open toe shoes and according to what he is telling me never wears anything that would rub on his foot. There is no evidence of infection and specifically no evidence of a fungal infection 10/22/16 On evaluation today patient's wounds appeared to be doing better compared to last week in regard to his bilateral lower extremities. Fortunately I happy with how things are progressing although he still has ulcers I feel like that he is improving and  appropriate manner. 11/12/16 on evaluation today patient appears to be doing well in regard to his bilateral feet and the respective wounds. We have been using surrounding her dressings along with an antifungal cream which seems to be doing very well. He has no bilateral dysfunction noticed that the rituals are weight loss at this point. He also has no nausea or vomiting a note purulent discharge. He did see Vein and vascular today and he tells me that they told him he could have surgery for his venous stasis but they did not feel like it was worth it in his words. Fortunately patient's wounds do appear to be getting sneakily better. 11/26/2016 -- he says he is going to have some surgery during this week at [facility] for possibly a colon resection. 12/31/2016 -- the patient has been noncompliant with his smoking and I'm not sure whether he is also started drinking again. He continues to be very nonchalant about his care 01/14/2017 -- the patient's HandP has been reviewed well and I understand he is being compliant with trying to give up smoking  and his local dressing changes. He does not have any surgical options of 4 to him by his vascular surgeons.he was last seen in early August by Dr. Hortencia Pilar who recommended compression stockings,and possibly lymph pumps in 2-3 months after doing a review ultrasound. 01/28/2017 - the patient did not have any fresh complaints but on examination I noted a large lacerated wound on the plantar aspect of his right fourth toe which had a lot of necrotic debris and it probes down to bone. 02/07/2017 -- x-ray of the right foot -- IMPRESSION: Soft tissue swelling about the first through fourth toes consistent with cellulitis. New destructive change in the tuft of the distal phalanx of the great toe is consistent with osteomyelitis. 02/14/2017 -- the patient's MRI is pending this coming Monday and he still continues to smoke. We have again gone over off loading  of his wounds in great detail and he says he's been compliant. 02/21/2017 -- MR of the right foot -- IMPRESSION: 1. Soft tissue ulcer at the tip of the first, second and third toe knows. Cortical irregularity and bone marrow edema in the first distal phalanx most concerning for osteomyelitis. Mild marrow edema in the second and third distal phalanx without definite cortical destruction which may reflect early osteomyelitis versus reactive marrow edema. 2. Soft tissue edema surrounding the first phalanx most consistent with cellulitis. the patient was also recently evaluated by his medical oncologist Dr. 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 She is treating him with weekly Procrit. She is also keeping intra-abdominal lymphadenopathy and right lower lobe lung nodule under observation. 04/04/2017 -- he was seen by Dr. Adrian Prows on 03/25/2017 -- after review he empirically put him on ciprofloxacin and doxycycline as they have good bone penetration and good bioavailability and it will cover the usual pathogens and diabetic foot osteomyelitis. He will check inflammatory markers and plan a 15-70 week old records. C-reactive protein was 0.3 and the ESR was 72 04/18/17 on evaluation today patient appears to be doing about the same in regard to his lower extremity wounds bilaterally. He has continued to use the antifungal cream which does seem to be beneficial. Nonetheless the ulcers do seem to in some areas be epithelial eyes over and in other areas are still open. He is having no significant discomfort. 04/25/17-he is here in follow-up evaluation for multiple ulcerations to multiple toes bilaterally. He states he did see Dr. Ola Spurr again last week and continues antibiotic therapy. He is voicing no complaints or concerns, will continue with current treatment plan will possibility of adding compression therapy next week after  an additional week of treatment/lotions to BLE prescribed by Dr Ola Spurr 05/02/17 he is here in follow up for for multiple ulcers to multiple toes bilaterally. we will stop using antifungal cream and will continue with silvercel and follow up next week [name], Cayce E. ([id number]) 05/09/17-he is here in follow-up for multiple ulcerations to multiple toes bilaterally. There is improvement in appearance. He has not completely stopped using antifungal cream, but admits he has not using it between the toes. He has an appointment with Dr. Ola Spurr on 2/11, continues on doxycycline and Cipro. It has been 5 weeks of antibiotic therapy, we will order plain film xray to evaluate for osteomyelitis next week, prior to follow up with ID. Will continue with silvercel and follow up next week 05/16/17-he is here in follow-up evaluation for multiple ulcerations to multiple  toes bilaterally and new wound to the right posterior heel. There is essentially no change in appearance, deteriorating measurements; he has a history of waxing and waning measurements. He admits that he continues to apply moisturizer/cream/ointment to his toes despite weekly reminders to only apply silvercel to his toes. He states that he thinks the surgical shoe contributed to the superficial ulcer to his posterior heel, he is unable to articulate if this was an area of dry cracked skin as he has a similar area to the left heel. He now is wearing open toed slippers. He has an appointment with Dr. Ola Spurr on 2/11. We have ordered x-rays for her bilateral feet; he was advised to obtain the x-rays today or tomorrow. He will follow-up next week 05/23/17-he is here in follow-up evaluation for multiple ulcerations to multiple toes bilaterally and the right posterior heel. There is improvement in maceration. He has been compliant and not applying any moisturizing agent to his toes. He has been using Lac-Hydrin for his lower extremities with  improvement. He did not go to his appointment on Monday with Dr. Ola Spurr secondary to financial concerns. X-rays for her bilateral feet showed: LEFT FOOT with slight erosion of the tuft of the distal phalanges of the left first and second toe suspicious for osteomyelitis, RIGHT FOOT with 1.erosion of the tufts of the distal phalanges of the right first second and possibly third toes consistent with osteomyelitis, 2 no definitive abnormality of the calcaneus is seen on the images obtained, 3. Plantar calcaneal degenerative spur. We briefly discussed hyperbaric adjunctive therapy for treatment of chronic refractory osteomyelitis. I do not find an a1c in EMR, will contact PCP for record, or order if needed. He has been encouraged to contact Dr Ola Spurr office regarding the follow-up appointment, encouraged him to inquire about payment plan. We will continue with same treatment plan and follow-up next week. He states he is still taking antibiotics and has "a lot" left. He states he has been taking them as directed, 2 pills twice daily. According to Dr. Blane Ohara office notes he was originally started on 12/17 for 4 weeks and extended on 1/14 for an additional 4 weeks. He should be done with his antibiotic therapy, he was advised to bring his bottles and to his next appointment, we will contact pharmacy. 05/30/17-he is here in follow-up evaluation for multiple ulcerations to multiple toes bilaterally and the right posterior heel. He is accompanied by his brother-in-law. Wounds are stable. He has yet to make up with Dr. Ola Spurr. We contacted his PCP, with no record of recent A1c we will draw an A1c. His brother-in-law states that he was taken off all of his diabetic medication secondary to kidney function. 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 The antibiotics were counted and have approximately  2 weeks left, although they should be complete. The pill organizer reveals missing days. We discussed the need for consistent medications, to have optimal benefit of medication. He has a cousin that lives with him and he will ask her to check his organizer daily. He has been advised to follow up with Dr Ola Spurr, and will go by the office today. He has been advised to quit smoking. 06/06/17-he is here in follow up evaluation. He has had to make an appointment with Dr. Ola Spurr. He did have blood work obtained, a1c 5. He continues to take antibiotic therapy. Significant improvement in bilateral lower extremity edema with  compression therapy. Essentially no change in ulcerations to toes. He states he is "going to try something different" and "let me know next week" if it works; he would not provide any additional information and was encouraged to follow our orders. We will follow up next week 06/13/17-he is here in follow-up evaluation. He has an appointment with Dr. Ola Spurr tomorrow morning. He states he purchased an ointment from [facility] and applied to his toes for 3 days, he does not remember the name of the ointment. There is improvement to his wounds, minimal maceration. He continues to take antibiotic therapy, this should have been completed last month. His brother-in-law who regularly accompanies his appointments was asked to take the bottles to the appointment tomorrow with Dr. Ola Spurr so he is aware. We will continue with 3 layer compression, and order OPEN TOE compression 20-30mmHg; we will apply compression stockings next week. He continues to smoke, smoked "2 cigarettes" last week 06/20/17 on evaluation today patient did receive his compression stockings which he has with him today for both lower extremities. With that being said he tells me at this point in time that he is very happy to have these he really is not a big fan of the compression wraps that we have been utilizing  although they have been of great benefit for him. Nonetheless at this point he does want to switch to the compression stockings. In my opinion as long as he is continuing with compression I'm okay with the stockings or the wraps. 06/27/17-he is here in follow-up evaluation for multiple ulcerations to his bilateral toes. There is some improvement in appearance. He is compliant in wearing his compression stockings with significant improvement in lower extremity edema. He saw Dr Ola Spurr on 3/8, per his notes they would redraw ESR and CRP; plan to continue antibiotic therapy if these remain elevated. I do not see an ESR or CRP level in Epic. The patient continues to take antibiotics. 07/11/17-He is here in follow-up evaluation for multiple ulcerations to multiple toes bilaterally. He presents with complete epithelialization to the right third toe; there has been no deterioration. He continues on antibiotic therapy. He will follow-up next week 07/18/17-He is here in follow-up evaluation for multiple ulcerations to multiple toes bilaterally. He continues to make improvement. He continues on antibiotic therapy. He states he has been using something additional to our orders, he does Spilde, Dadrian E. ([id number]) not elaborate but states he will bring it in next week. 07/25/17-He is here in follow up evaluation for multiple ulcerations to bilateral toes. He is stable. He has completed antibiotic therapy. He admits to "filing" his toes after showers each evening, this is what he was referencing last week; he does not filing for the wounds. We will switch to [facility] and monitor for any improvement, he will follow-up next week 08/01/17-He is here in follow-up evaluation. He admits to "picking" at his toes after cleansing yesterday, leading to new areas of tissue loss on the bilateral second toe. There is improvement noted to the bilateral great toe. We will dress toes today and hope that they maintain until  Monday where he will come in for a nurse visit. He has been advised, multiple times with expressed verbalization, to change the dressings to silvercel if the dressings get wet prior to [facility] appointment. 08/08/17-He is here in follow-up evaluation for bilateral first and second toe ulcerations. There is significant improvement to all ulcerations since last visit. We will switch to [facility] to all wounds  and he will continue with nurse visits on a Monday/Thursday schedule and follow-up with me in 2 weeks. He continues to smoke, 1-3 cigarettes per day, and has been encouraged to not smoke until his next follow-up in 2 weeks. 08/15/17 on evaluation today patient's ulcers on his toes actually appear to be doing fairly well the right to ulcers may be a little bit more moist compared to the left I'm not really sure exactly why as the openings appear to be very small and I'm not seeing any evidence of anything significant as far as you lightest or otherwise. Nonetheless this may just be a small setback he's been doing very well with the [facility] Dressing 08/22/17-He is here in follow-up evaluation for ulcerations to his bilateral first and second toes, there is small/scant amount of drainage noted on today's dressing. He continues with Hydrofera Blue. He continues to smoke, 1 cigarette a day. Voices no complaint or concerns, compliant and compression therapy today. He'll follow-up next week 08/29/17-He is here in follow-up evaluation for ulcerations to his bilateral first and second toes, with a new wound to the left third toe. He states he cut himself while cutting the toenail. He presents today with more maceration and increased measurements; waxing and waning measurements and moisture has been an ongoing issue. He continues to smoke a proximally 1 cigarette per day, but states he has not smoked since Sunday. We will initiate medihoney daily and evaluate next week. He presents today without  compression stockings 09/05/17-He is here in follow-up evaluation for ulcerations to bilateral first and second and left third toe. He did not pick up the medihoney and therefore has not been changing his dressing. He admits to inconsistent where of compression stockings, admits to pain to his bilateral lower extremities with unilateral edema (right greater than left). The ulcerations to multiple toes are more macerated and larger in size than last week. He is wearing compression therapy today. He admits to an overall feeling of weakness and fatigue and has been encouraged to contact his PCP for evaluation; he has a known history of anemia and intraabdominal lymphadenopathy. We will return to silvercel and he will follow up next week 09/19/17-He is here in follow-up evaluation for ulcerations to bilateral first and second toe.he was unable to make last week's appointment. The culture that was obtained on 5/30 grew clindamycin sensitive MRSA; he was initiated on 6/4 but did not start it until 6/7. There is significant improvement in all wounds, healing to left second and third toe. He also admits to having no alcohol or smoking for the last 6 days. We will continue with same treatment plan I will extend the clindamycin for an additional 10 days and he will follow-up next week. 09/26/17- He is here in follow evaluation for multiple ulcerations to multiple toes bilaterally. He continues to be non-compliant with dressing, compression therapy. He states he is taking the antibiotics as prescribed, although his recall is inconsistent. There is noted deterioration in all ulcers. We will continue same treatment plan, antibiotic therapy and he will follow up next week. 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 This information is obtained  from the medical record, as he cannot articulate specifics to his diagnosis or treatment plan. 10/17/17-He is here in follow-up evaluation for multiple ulcerations to multiple toes bilaterally.  There is chronic waxing and waning improvement/deterioration. On today's exam they appear improved, dry without maceration. He continues to apply a variety of topical agents, stating that he has switched between the blue product and silver product. In my opinion, this will be a chronic waxing and waning giving his intermittent compliance. He will follow-up in 2 weeks. As it relates to the biopsy taken from his buttock, he has not followed up with that and has not started radiation therapy. 10/31/17-He is here for evaluation for multiple ulcerations to multiple toes bilaterally. Continues with waxing and waning improvement versus deterioration. They are stable on today's exam without significant maceration. 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 We will continue with every 2 week follow-up appointments 11/14/17- He is seen in follow-up evaluation for multiple ulcerations to multiple bilateral toes. He is scheduled for excision of basal cell carcinoma to his right buttock on Monday 8/12. He continues to have chronic waxing and waning of his ulcers; today is a stable day with minimal drainage and left ear is better than right toes. I will recount to infectious disease regarding chronic suppressive therapy. He admits to being more compliant with compression stockings, although he is not wearing any today. He has been encouraged to continue with dressing changes as ordered and compression therapy daily. He will be seen in 2 weeks 11/28/17-He is seen in follow up evaluation for multiple ulcerations to multiple toes bilaterally. He had an overnight admission at [facility] (8/11-8/12) for symptomatic anemia, received two units prbc. His surgical date has been moved to 9/94 excision  of basal cell carcinoma to the right buttock. He has been noncompliant in wearing compression stockings, applying topical treatment as ordered; he has been applying a "cream", Hydrofera Blue, silvercel. He is currently on no antibiotic Grosso, Dominic E. ([id number]) therapy; we will send for re-referral to ID for suppressive therapy given his multiple co-morbidities, poor surgical candidate, unwilling to have toe amputation and general non-adherence. 12/19/17 on evaluation today patient actually appears to be doing very well in regard to his toes at least compared to last time I saw him. He again has been coming to our office for quite a bit of time. 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 He states is hurting a little bit but doing well in general. I'm see were not taking care of this area dermatology is. He does have his appointment with infectious disease upcoming which they re-consult for suppressive therapy that's next Monday. 01/02/18 on evaluation today patient actually appears to be doing rather well in regard to his toe ulcers although he tells me he has been put in an ointment on this which I am concerned may be causing some additional moisture buildup which we really do not want. He supposed be using silver cell which is mainly thing I want him to be utilizing. Fortunately there does not appear to be any evidence of worsening infection which is good news. He has had surgery on his gluteal region where he had an excision of a cancerous area fortunately that seems to be doing well although he is having some discomfort. Patient History Information obtained from Patient. Social History Current every day smoker, Marital Status - Widowed, Alcohol Use - Daily - quit drinking about a week ago, Drug Use - No History, Caffeine Use - Moderate. 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 Respiratory The patient has no complaints or symptoms. Psychiatric The patient has no complaints or symptoms. Objective Constitutional Well-nourished and well-hydrated in no acute distress. Vitals Time Taken: 8:40 AM, Height: 69 in, Weight: 168 lbs, BMI: 24.8, Temperature: 97.8 F, Pulse: 80 bpm, Respiratory Rate: 16 breaths/min, Blood Pressure: 158/81 mmHg. Respiratory normal breathing without difficulty. clear to auscultation bilaterally. Cardiovascular regular rate and rhythm with normal S1, S2. Psychiatric Morency, Oshua E. ([id number]) this patient is able to make decisions and demonstrates good insight into disease process. Alert and Oriented x 3. pleasant and cooperative. General Notes: Patient's wound bed currently on his toes actually show signs again of a little bit more fluid buildup the normal I think this is due to the fact is been put in over-the-counter anabiotic appointment on this which again we have not really advised him to do. I suggested that he not utilize this currently. He understands. We will stick with the silver cell at this point. Integumentary (Hair, Skin) Wound #1 status is Open. Original cause of wound was Gradually Appeared. The wound is located on the Right Toe Great. The wound measures 0.5cm length x 1.2cm width x 0.1cm depth; 0.471cm^2 area and 0.047cm^3 volume. Wound #14 status is Open. Original cause of wound was Gradually Appeared. The wound is located on the Left Toe Second. The wound measures 1.5cm length x 2cm width x 0.1cm depth; 2.356cm^2 area and 0.236cm^3 volume. Wound #16 status is Open. Original cause of wound was Not Known. The wound is located on the [employer]. The wound measures 0.1cm length x 0.1cm width x 0.1cm depth; 0.008cm^2 area  and 0.001cm^3 volume. Wound #2 status is Open. Original cause of wound was Gradually Appeared. The wound is located on the Right Toe Second. The wound measures 1cm length x 2.4cm width x 0.1cm depth; 1.885cm^2 area and 0.188cm^3 volume. Wound #5 status is Open. Original cause of wound was Gradually Appeared. The wound is located on the Left Toe Great. The wound measures 0.1cm length x 0.1cm width x 0.1cm depth; 0.008cm^2 area and 0.001cm^3 volume. 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 Wound #14 Left Toe Second: Clean wound with Normal Saline. Wound #16 Right,Plantar Toe Great: Clean wound with Normal Saline. Wound #2 Right Toe Second: Clean wound with Normal Saline. Wound #5 Left Toe Great: Stebbins, Moris E. ([id number]) Clean wound with Normal Saline. Anesthetic (add to Medication List): Wound #1 Right Toe Great: Topical Lidocaine 4% cream applied to wound bed prior to debridement (In Clinic Only). Wound #14 Left Toe Second: Topical Lidocaine 4% cream applied to wound bed prior to debridement (In Clinic Only). Wound #16 Right,Plantar Toe Great: Topical Lidocaine 4% cream applied to wound bed prior to debridement (In Clinic Only). Wound #2 Right Toe Second: Topical Lidocaine 4% cream applied to wound bed prior to debridement (In Clinic Only). Wound #5 Left Toe Great: Topical Lidocaine  4% cream applied  to wound bed prior to debridement (In Clinic Only). Primary Wound Dressing: Wound #1 Right Toe Great: Silver Alginate Wound #14 Left Toe Second: Silver Alginate Wound #16 Right,Plantar Toe Great: Silver Alginate Wound #2 Right Toe Second: Silver Alginate Wound #5 Left Toe Great: Silver Alginate Secondary Dressing: Wound #1 Right Toe Great: ABD pad Dry Gauze Conform/Kerlix Wound #14 Left Toe Second: ABD pad Dry Gauze Conform/Kerlix Wound #16 Right,Plantar Toe Great: ABD pad Dry Gauze Conform/Kerlix Wound #2 Right Toe Second: ABD pad Dry Gauze Conform/Kerlix Wound #5 Left Toe Great: ABD pad Dry Gauze Conform/Kerlix Dressing Change Frequency: Wound #1 Right Toe Great: Change dressing every day. Wound #14 Left Toe Second: Change dressing every day. Wound #16 Right,Plantar Toe Great: Change dressing every day. Wound #2 Right Toe Second: Change dressing every day. Wound #5 Left Toe Great: Change dressing every day. Follow-up Appointments: Wound #1 Right Toe Great: Return Appointment in 3 weeks. Wound #14 Left Toe Second: Return Appointment in 3 weeks. TAYSHUN, GAPPA ([id number]) Wound #16 Right,Plantar Toe Great: Return Appointment in 3 weeks. Wound #2 Right Toe Second: Return Appointment in 3 weeks. Wound #5 Left Toe Great: Return Appointment in 3 weeks. Edema Control: Wound #1 Right Toe Great: Patient to wear own compression stockings Wound #14 Left Toe Second: Patient to wear own compression stockings Wound #16 Right,Plantar Toe Great: Patient to wear own compression stockings Wound #2 Right Toe Second: Patient to wear own compression stockings Wound #5 Left Toe Great: Patient to wear own compression stockings Additional Orders / Instructions: Wound #1 Right Toe Great: Stop Smoking Increase protein intake. Wound #14 Left Toe Second: Stop Smoking Increase protein intake. Wound #16 Right,Plantar Toe Great: Stop Smoking Increase protein  intake. Wound #2 Right Toe Second: Stop Smoking Increase protein intake. Wound #5 Left Toe Great: Stop Smoking Increase protein intake. I'm gonna suggest currently that we continue with the above wound care measures for the next week. The patient is in agreement with plan. If anything changes or worsens will let me know. Please see above for specific wound care orders. We will see patient for re-evaluation in 3 week(s) here in the clinic. If anything worsens or changes patient will contact our office for additional recommendations. Electronic Signature(s) Signed: 01/02/2018 3:07:42 PM By: Worthy Keeler PA-C Entered By: Worthy Keeler on 01/02/2018 09:04:50 Nauman, [name] ([id number]) -------------------------------------------------------------------------------- ROS/PFSH Details Patient Name: Semidey, Layton E. Date of Service: 01/02/2018 8:30 AM Medical Record Number: [id number] Patient Account Number: [id number] Date of Birth/Sex: October 23, 1950 (67 y.o. M) Treating RN: Roger Shelter Primary Care Provider: Lamonte Sakai Other Clinician: Referring Provider: Lamonte Sakai Treating Provider/Extender: Melburn Hake, HOYT Weeks in Treatment: 65 Information Obtained From Patient Wound History Do you currently have one or more open woundso Yes How many open wounds do you currently haveo 6 Approximately how long have you had your woundso 3 months How have you been treating your wound(s) until nowo ointment and bandage Has your wound(s) ever healed and then re-openedo No Have you had any lab work done in the past montho No Have you tested positive for an antibiotic resistant organism (MRSA, VRE)o No Have you tested positive for osteomyelitis (bone infection)o No Have you had any tests for circulation on your legso Yes Who ordered the testo PCP Where was the test doneo [facility] Constitutional Symptoms (General Health) Complaints and Symptoms: Negative for: Fever; Chills Eyes Medical  History: Negative for: Cataracts; Glaucoma; Optic Neuritis Ear/Nose/Mouth/Throat Medical History: Negative for: Chronic  sinus problems/congestion; Middle ear problems Hematologic/Lymphatic Medical History: Positive for: Anemia; Lymphedema Negative for: Hemophilia; Human Immunodeficiency Virus; Sickle Cell Disease Respiratory Complaints and Symptoms: No Complaints or Symptoms Medical History: Negative for: Aspiration; Asthma; Chronic Obstructive Pulmonary Disease (COPD); Pneumothorax; Sleep Apnea; Tuberculosis Cardiovascular Medical History: Positive for: Congestive Heart Failure; Hypertension; Peripheral Venous Disease Deweese, Mendel E. ([id number]) Negative for: Angina; Arrhythmia; Coronary Artery Disease; Deep Vein Thrombosis; Hypotension; Myocardial Infarction; Peripheral Arterial Disease; Phlebitis; Vasculitis Gastrointestinal Medical History: Negative for: Cirrhosis ; Colitis; Crohnos; Hepatitis A; Hepatitis B; Hepatitis C Endocrine Medical History: Positive for: Type II Diabetes Treated with: Oral agents Blood sugar tested every day: Yes Tested : QD Genitourinary Medical History: Negative for: End Stage Renal Disease Immunological Medical History: Negative for: Lupus Erythematosus; Raynaudos; Scleroderma Integumentary (Skin) Medical History: Negative for: History of Burn; History of pressure wounds Musculoskeletal Medical History: Negative for: Gout; Rheumatoid Arthritis; Osteoarthritis; Osteomyelitis Neurologic Medical History: Positive for: Neuropathy Negative for: Dementia; Quadriplegia; Paraplegia; Seizure Disorder Oncologic Medical History: Past Medical History Notes: 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 Psychiatric Complaints and Symptoms: No Complaints or Symptoms Immunizations Pneumococcal Vaccine: Received Pneumococcal Vaccination: No Immunization Notes: up to date NATHIAN, STENCIL ([id number]) Implantable Devices Family  and Social History Current every day smoker; Marital Status - Widowed; Alcohol Use: Daily - quit drinking about a week ago; Drug Use: No History; Caffeine Use: Moderate; Financial Concerns: No; Food, Clothing or Shelter Needs: No; Support System Lacking: No; Transportation Concerns: No; Advanced Directives: No; Patient does not want information on Advanced Directives Physician Affirmation I have reviewed and agree with the above information. Electronic Signature(s) Signed: 01/02/2018 3:07:42 PM By: Worthy Keeler PA-C Signed: 01/03/2018 4:22:48 PM By: Roger Shelter Entered By: Worthy Keeler on 01/02/2018 09:04:08 Skeet, [name] ([id number]) -------------------------------------------------------------------------------- SuperBill Details Patient Name: Carnathan, Zakai E. Date of Service: 01/02/2018 Medical Record Number: [id number] Patient Account Number: [id number] Date of Birth/Sex: 21-Mar-1951 (67 y.o. 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 I89.0 Lymphedema, not elsewhere classified F17.218 Nicotine dependence, cigarettes, 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 abuse 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, right ankle and foot Facility Procedures CPT4 Code: 11735670 Description: 14103 - WOUND CARE VISIT-LEV 5 EST PT Modifier: Quantity: 1 Physician Procedures CPT4 Code Description: [id number] - WC PHYS LEVEL  3 - EST PT ICD-10 Diagnosis Description L97.521 Non-pressure chronic ulcer of other part of left foot limited to L97.511 Non-pressure chronic ulcer of other part of right foot limited t E11.621 Type 2  diabetes mellitus with foot ulcer I87.323 Chronic venous hypertension (idiopathic) with inflammation of bi Modifier: breakdown of s o breakdown of lateral lower e Quantity: 1 kin skin xtremity Electronic Signature(s) Signed: 01/03/2018 5:13:23 PM By: Worthy Keeler PA-C Previous Signature: 01/02/2018 3:07:42 PM Version By: Worthy Keeler PA-C Entered By: Sharon Mt on 01/03/2018 15:12:43

## 2018-01-05 NOTE — Progress Notes (Signed)
[name], [name] ([id number]) Visit Report for 01/02/2018 Arrival Information Details Patient Name: [name], [name] Date of Service: 01/02/2018 8:30 AM Medical Record Number: [id number] Patient Account Number: [id number] Date of Birth/Sex: [birth year] (67 y.o. M) Treating RN: Roger Shelter Primary Care Elim Economou: Lamonte Sakai Other Clinician: Referring Aimie Wagman: Lamonte Sakai Treating Couper Juncaj/Extender: Melburn Hake, HOYT Weeks in Treatment: 85 Visit Information History Since Last Visit Added or deleted any medications: No Patient Arrived: Ambulatory Any new allergies or adverse reactions: No Arrival Time: 08:37 Had a fall or experienced change in No Accompanied By: brother in activities of daily living that may affect law risk of falls: Transfer Assistance: None Signs or symptoms of abuse/neglect since last visito No Patient Identification Verified: Yes Hospitalized since last visit: No Secondary Verification Process Completed: Yes Implantable device outside of the clinic excluding No Patient Requires Transmission-Based No cellular tissue based products placed in the center Precautions: since last visit: Patient Has Alerts: Yes Has Dressing in Place as Prescribed: Yes Patient Alerts: DMII Pain Present Now: No Electronic Signature(s) Signed: 01/02/2018 9:10:12 AM By: Lorine Bears RCP, RRT, CHT Entered By: Becky Sax, Amado Nash on 01/02/2018 08:38:30 [name], [name] ([id number]) -------------------------------------------------------------------------------- Clinic Level of Care Assessment Details Patient Name: [name], [name] Date of Service: 01/02/2018 8:30 AM Medical Record Number: [id number] Patient Account Number: [id number] Date of Birth/Sex: July 27, 1950 (67 y.o. M) Treating RN: Roger Shelter Primary Care Djon Tith: Lamonte Sakai Other Clinician: Referring Amadea Keagy: Lamonte Sakai Treating Kaleel Schmieder/Extender: Melburn Hake, HOYT Weeks in Treatment: 31 Clinic  Level of Care Assessment Items TOOL 4 Quantity Score X - Use when only an EandM is performed on FOLLOW-UP visit 1 0 ASSESSMENTS - Nursing Assessment / Reassessment X - Reassessment of Co-morbidities (includes updates in patient status) 1 10 X- 1 5 Reassessment of Adherence to Treatment Plan ASSESSMENTS - Wound and Skin Assessment / Reassessment []  - Simple Wound Assessment / Reassessment - one wound 0 X- 5 5 Complex Wound Assessment / Reassessment - multiple wounds []  - 0 Dermatologic / Skin Assessment (not related to wound area) ASSESSMENTS - Focused Assessment []  - Circumferential Edema Measurements - multi extremities 0 []  - 0 Nutritional Assessment / Counseling / Intervention []  - 0 Lower Extremity Assessment (monofilament, tuning fork, pulses) []  - 0 Peripheral Arterial Disease Assessment (using hand held doppler) ASSESSMENTS - Ostomy and/or Continence Assessment and Care []  - Incontinence Assessment and Management 0 []  - 0 Ostomy Care Assessment and Management (repouching, etc.) PROCESS - Coordination of Care []  - Simple Patient / Family Education for ongoing care 0 X- 1 20 Complex (extensive) Patient / Family Education for ongoing care []  - 0 Staff obtains Programmer, systems, Records, Test Results / Process Orders []  - 0 Staff telephones HHA, Nursing Homes / Clarify orders / etc []  - 0 Routine Transfer to another Facility (non-emergent condition) []  - 0 Routine Hospital Admission (non-emergent condition) []  - 0 New Admissions / Biomedical engineer / Ordering NPWT, Apligraf, etc. []  - 0 Emergency Hospital Admission (emergent condition) []  - 0 Simple Discharge Coordination [name], [name] ([id number]) X- 1 15 Complex (extensive) Discharge Coordination PROCESS - Special Needs []  - Pediatric / Minor Patient Management 0 []  - 0 Isolation Patient Management []  - 0 Hearing / Language / Visual special needs []  - 0 Assessment of Community assistance (transportation, D/C  planning, etc.) []  - 0 Additional assistance / Altered mentation []  - 0 Support Surface(s) Assessment (bed, cushion, seat, etc.) INTERVENTIONS - Wound Cleansing / Measurement []  - Simple Wound Cleansing -  one wound 0 X- 5 5 Complex Wound Cleansing - multiple wounds X- 1 5 Wound Imaging (photographs - any number of wounds) []  - 0 Wound Tracing (instead of photographs) []  - 0 Simple Wound Measurement - one wound X- 5 5 Complex Wound Measurement - multiple wounds INTERVENTIONS - Wound Dressings X - Small Wound Dressing one or multiple wounds 5 10 []  - 0 Medium Wound Dressing one or multiple wounds []  - 0 Large Wound Dressing one or multiple wounds []  - 0 Application of Medications - topical []  - 0 Application of Medications - injection INTERVENTIONS - Miscellaneous []  - External ear exam 0 []  - 0 Specimen Collection (cultures, biopsies, blood, body fluids, etc.) []  - 0 Specimen(s) / Culture(s) sent or taken to Lab for analysis []  - 0 Patient Transfer (multiple staff / Civil Service fast streamer / Similar devices) []  - 0 Simple Staple / Suture removal (25 or less) []  - 0 Complex Staple / Suture removal (26 or more) []  - 0 Hypo / Hyperglycemic Management (close monitor of Blood Glucose) []  - 0 Ankle / Brachial Index (ABI) - do not check if billed separately X- 1 5 Vital Signs [name], [name] ([id number]) Has the patient been seen at the hospital within the last three years: Yes Total Score: 185 Level Of Care: New/Established - Level 5 Electronic Signature(s) Signed: 01/03/2018 4:22:48 PM By: Roger Shelter Entered By: Roger Shelter on 01/02/2018 09:14:55 [name], [name] ([id number]) -------------------------------------------------------------------------------- Encounter Discharge Information Details Patient Name: [name], [name] Date of Service: 01/02/2018 8:30 AM Medical Record Number: [id number] Patient Account Number: [id number] Date of Birth/Sex: 06/29/1950 (67 y.o.  M) Treating RN: Roger Shelter Primary Care Daira Hine: Lamonte Sakai Other Clinician: Referring Annaliyah Willig: Lamonte Sakai Treating Nohlan Burdin/Extender: Melburn Hake, HOYT Weeks in Treatment: 66 Encounter Discharge Information Items Discharge Condition: Stable Ambulatory Status: Ambulatory Discharge Destination: Home Transportation: Private Auto Accompanied By: brother n law Schedule Follow-up Appointment: Yes Clinical Summary of Care: Electronic Signature(s) Signed: 01/03/2018 4:22:48 PM By: Roger Shelter Entered By: Roger Shelter on 01/02/2018 09:16:21 [name], [name] ([id number]) -------------------------------------------------------------------------------- Lower Extremity Assessment Details Patient Name: [name], [name] Date of Service: 01/02/2018 8:30 AM Medical Record Number: [id number] Patient Account Number: [id number] Date of Birth/Sex: March 31, 1951 (67 y.o. M) Treating RN: Cornell Barman Primary Care Alima Naser: Lamonte Sakai Other Clinician: Referring Kelci Petrella: Lamonte Sakai Treating Chieko Neises/Extender: Melburn Hake, HOYT Weeks in Treatment: 66 Edema Assessment Assessed: [Left: No] [Right: No] Edema: [Left: No] [Right: No] Vascular Assessment Pulses: Dorsalis Pedis Palpable: [Left:Yes] [Right:Yes] Posterior Tibial Extremity colors, hair growth, and conditions: Extremity Color: [Left:Dusky] [Right:Dusky] Hair Growth on Extremity: [Left:No] [Right:No] Temperature of Extremity: [Left:Warm] [Right:Warm] Capillary Refill: [Left:< 3 seconds] [Right:< 3 seconds] Toe Nail Assessment Left: Right: Thick: Yes Yes Discolored: Yes Yes Deformed: Yes Yes Improper Length and Hygiene: Yes Yes Notes Toe nails missing on Great and second toes bilaterally. Electronic Signature(s) Signed: 01/02/2018 6:21:04 PM By: Gretta Cool, BSN, RN, CWS, Kim RN, BSN Entered By: Gretta Cool, BSN, RN, CWS, Kim on 01/02/2018 08:48:50 Meche, [name]  ([id number]) -------------------------------------------------------------------------------- Multi Wound Chart Details Patient Name: Dipasquale, Krist E. Date of Service: 01/02/2018 8:30 AM Medical Record Number: [id number] Patient Account Number: [id number] Date of Birth/Sex: May 27, 1950 (67 y.o. M) Treating RN: Roger Shelter Primary Care Kailene Steinhart: Lamonte Sakai Other Clinician: Referring Angline Schweigert: Lamonte Sakai Treating Nicolis Boody/Extender: Melburn Hake, HOYT Weeks in Treatment: 42 Vital Signs Height(in): 69 Pulse(bpm): 80 Weight(lbs): 168 Blood Pressure(mmHg): 158/81 Body Mass Index(BMI): 25 Temperature(F): 97.8 Respiratory Rate 16 (breaths/min): Photos: [1:No Photos] [14:No Photos] [16:No  Photos] Wound Location: [1:Right Toe Great] [14:Left Toe Second] [16:Right, Plantar Toe Great] Wounding Event: [1:Gradually Appeared] [14:Gradually Appeared] [16:Not Known] 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] Date Acquired: [1:06/11/2016] [14:09/26/2017] [16:10/29/2017] Weeks of Treatment: [1:66] [14:14] [16:9] Wound Status: [1:Open] [14:Open] [16:Open] Pending Amputation on [1:Yes] [14:No] [16:No] Presentation: Measurements L x W x D [1:0.5x1.2x0.1] [14:1.5x2x0.1] [16:0.1x0.1x0.1] (cm) Area (cm) : [1:0.471] [14:2.356] [16:0.008] Volume (cm) : [1:0.047] [14:0.236] [16:0.001] % Reduction in Area: [1:99.00%] [14:0.00%] [16:99.50%] % Reduction in Volume: [1:99.00%] [14:0.00%] [16:99.40%] Classification: [1:Grade 2] [14:Grade 1] [16:Grade 1] Periwound Skin Texture: [1:No Abnormalities Noted] [14:No Abnormalities Noted] [16:No Abnormalities Noted] Periwound Skin Moisture: [1:No Abnormalities Noted] [14:No Abnormalities Noted] [16:No Abnormalities Noted] Periwound Skin Color: [1:No Abnormalities Noted No] [14:No Abnormalities Noted No] [16:No Abnormalities Noted No] Wound  Number: 2 5 N/A Photos: No Photos No Photos N/A Wound Location: Right Toe Second Left Toe Great N/A Wounding Event: Gradually Appeared Gradually Appeared N/A Primary Etiology: Diabetic Wound/Ulcer of the Diabetic Wound/Ulcer of the N/A Lower Extremity Lower Extremity Date Acquired: 06/11/2016 06/11/2016 N/A Weeks of Treatment: 66 66 N/A Wound Status: Open Open N/A Pending Amputation on Yes Yes N/A Presentation: Measurements L x W x D 1x2.4x0.1 0.1x0.1x0.1 N/A (cm) Area (cm) : 1.885 0.008 N/A Volume (cm) : 0.188 0.001 N/A Looney, Gerrard E. ([id number]) % Reduction in Area: 38.60% 99.90% N/A % Reduction in Volume: 38.80% 99.90% N/A Classification: Grade 2 Grade 2 N/A Periwound Skin Texture: No Abnormalities Noted No Abnormalities Noted N/A Periwound Skin Moisture: No Abnormalities Noted No Abnormalities Noted N/A Periwound Skin Color: No Abnormalities Noted No Abnormalities Noted N/A Tenderness on Palpation: No No N/A Treatment Notes Electronic Signature(s) Signed: 01/03/2018 4:22:48 PM By: Roger Shelter Entered By: Roger Shelter on 01/02/2018 08:57:40 Shurley, [name] ([id number]) -------------------------------------------------------------------------------- [place] Details Patient Name: [name] E. Date of Service: 01/02/2018 8:30 AM Medical Record Number: [id number] Patient Account Number: [id number] Date of Birth/Sex: 1950/11/06 (67 y.o. M) Treating RN: Roger Shelter Primary Care Alyssah Algeo: Lamonte Sakai Other Clinician: Referring Loella Hickle: Lamonte Sakai Treating Maecie Sevcik/Extender: Melburn Hake, HOYT Weeks in Treatment: 12 Active Inactive ` Abuse / Safety / Falls / Self Care Management Nursing Diagnoses: Potential for falls Goals: Patient will remain injury free related to falls Date Initiated: 09/21/2016 Target Resolution Date: 09/14/2017 Goal Status: Active Interventions: Assess fall risk on admission and as needed Notes: ` Nutrition Nursing  Diagnoses: Potential for alteratiion in Nutrition/Potential for imbalanced nutrition Goals: Patient/caregiver agrees to and verbalizes understanding of need to use nutritional supplements and/or vitamins as prescribed Date Initiated: 09/21/2016 Target Resolution Date: 09/14/2017 Goal Status: Active Interventions: Assess patient nutrition upon admission and as needed per policy Notes: ` Orientation to the Wound Care Program Nursing Diagnoses: Knowledge deficit related to the wound healing center program Goals: Patient/caregiver will verbalize understanding of the [facility] Date Initiated: 09/21/2016 Target Resolution Date: 06/15/2017 Goal Status: Active Interventions: DARKRogerick, Baldwin ([id number]) Provide education on orientation to the wound center Notes: ` Wound/Skin Impairment Nursing Diagnoses: Knowledge deficit related to smoking impact on wound healing Goals: Ulcer/skin breakdown will have a volume reduction of 30% by week 4 Date Initiated: 09/21/2016 Target Resolution Date: 08/17/2017 Goal Status: Active Ulcer/skin breakdown will have a volume reduction of 50% by week 8 Date Initiated: 09/21/2016 Target Resolution Date: 08/17/2017 Goal Status: Active Ulcer/skin breakdown will have a volume reduction of 80% by week 12 Date Initiated: 09/21/2016 Target Resolution Date: 09/14/2017  Goal Status: Active Ulcer/skin breakdown will heal within 14 weeks Date Initiated: 09/21/2016 Target Resolution Date: 08/17/2017 Goal Status: Active Interventions: Assess patient/caregiver ability to obtain necessary supplies Assess patient/caregiver ability to perform ulcer/skin care regimen upon admission and as needed Assess ulceration(s) every visit Notes: Electronic Signature(s) Signed: 01/03/2018 4:22:48 PM By: Roger Shelter Entered By: Roger Shelter on 01/02/2018 08:57:32 [name], [name]  ([id number]) -------------------------------------------------------------------------------- Pain Assessment Details Patient Name: [name], [name] Date of Service: 01/02/2018 8:30 AM Medical Record Number: [id number] Patient Account Number: [id number] Date of Birth/Sex: 30-Sep-1950 (68 y.o. M) Treating RN: Roger Shelter Primary Care Kiyra Slaubaugh: Lamonte Sakai Other Clinician: Referring Vencent Hauschild: Lamonte Sakai Treating Canyon Lohr/Extender: Melburn Hake, HOYT Weeks in Treatment: 83 Active Problems Location of Pain Severity and Description of Pain Patient Has Paino No Site Locations Pain Management and Medication Current Pain Management: Electronic Signature(s) Signed: 01/02/2018 9:10:12 AM By: Lorine Bears RCP, RRT, CHT Signed: 01/03/2018 4:22:48 PM By: Roger Shelter Entered By: Lorine Bears on 01/02/2018 08:38:38 [name], [name] ([id number]) -------------------------------------------------------------------------------- Patient/Caregiver Education Details Patient Name: [name], [name] Date of Service: 01/02/2018 8:30 AM Medical Record Number: [id number] Patient Account Number: [id number] Date of Birth/Gender: 1950/09/07 (67 y.o. M) Treating RN: Roger Shelter Primary Care Physician: Lamonte Sakai Other Clinician: Referring Physician: Lamonte Sakai Treating Physician/Extender: Sharalyn Ink in Treatment: 60 Education Assessment Education Provided To: Patient and Caregiver Education Topics Provided Wound/Skin Impairment: Handouts: Caring for Your Ulcer Methods: Explain/Verbal Responses: State content correctly Electronic Signature(s) Signed: 01/03/2018 4:22:48 PM By: Roger Shelter Entered By: Roger Shelter on 01/02/2018 09:16:34 [name], [name] ([id number]) -------------------------------------------------------------------------------- Wound Assessment Details Patient Name: Hegner, Jacobo E. Date of Service: 01/02/2018 8:30 AM Medical Record  Number: [id number] Patient Account Number: [id number] Date of Birth/Sex: 1950/07/03 (67 y.o. M) Treating RN: Cornell Barman Primary Care Kylie Gros: Lamonte Sakai Other Clinician: Referring Presly Steinruck: Lamonte Sakai Treating Jarnell Cordaro/Extender: Melburn Hake, HOYT Weeks in Treatment: 66 Wound Status Wound Number: 1 Primary Diabetic Wound/Ulcer of the Lower Etiology: Extremity Wound Location: Right Toe Great Wound Status: Open Wounding Event: Gradually Appeared Date Acquired: 06/11/2016 Weeks Of Treatment: 66 Clustered Wound: No Pending Amputation On Presentation Photos Photo Uploaded By: Secundino Ginger on 01/02/2018 10:44:48 Wound Measurements Length: (cm) 0.5 Width: (cm) 1.2 Depth: (cm) 0.1 Area: (cm) 0.471 Volume: (cm) 0.047 % Reduction in Area: 99% % Reduction in Volume: 99% Wound Description Classification: Grade 2 Periwound Skin Texture Texture Color No Abnormalities Noted: No No Abnormalities Noted: No Moisture No Abnormalities Noted: No Treatment Notes Wound #1 (Right Toe Great) 1. Cleansed with: Clean wound with Normal Saline 2. Anesthetic Topical Lidocaine 4% cream to wound bed prior to debridement Amstutz, Damare E. ([id number]) 4. Dressing Applied: Other dressing (specify in notes) Notes silvercell, gauze conform and tape Electronic Signature(s) Signed: 01/02/2018 6:21:04 PM By: Gretta Cool, BSN, RN, CWS, Kim RN, BSN Entered By: Gretta Cool, BSN, RN, CWS, Kim on 01/02/2018 08:47:27 Downen, [name] ([id number]) -------------------------------------------------------------------------------- Wound Assessment Details Patient Name: Letizia, Linn E. Date of Service: 01/02/2018 8:30 AM Medical Record Number: [id number] Patient Account Number: [id number] Date of Birth/Sex: [birth year] (67 y.o. M) Treating RN: Cornell Barman Primary Care Jaevin Medearis: Lamonte Sakai Other Clinician: Referring Earle Troiano: Lamonte Sakai Treating Kindell Strada/Extender: Melburn Hake, HOYT Weeks in Treatment: 75 Wound Status Wound  Number: 14 Primary Diabetic Wound/Ulcer of the Lower Etiology: Extremity Wound Location: Left Toe Second Wound Status: Open Wounding Event: Gradually Appeared Date Acquired: 09/26/2017 Weeks Of Treatment: 14 Clustered Wound: No Photos Photo Uploaded By: Secundino Ginger on 01/02/2018 10:44:48 Wound  Measurements Length: (cm) 1.5 Width: (cm) 2 Depth: (cm) 0.1 Area: (cm) 2.356 Volume: (cm) 0.236 % Reduction in Area: 0% % Reduction in Volume: 0% Wound Description Classification: Grade 1 Periwound Skin Texture Texture Color No Abnormalities Noted: No No Abnormalities Noted: No Moisture No Abnormalities Noted: No Treatment Notes Wound #14 (Left Toe Second) 1. Cleansed with: Clean wound with Normal Saline 2. Anesthetic Topical Lidocaine 4% cream to wound bed prior to debridement 4. Dressing Applied: JERICO, GRISSO ([id number]) Other dressing (specify in notes) Notes silvercell, gauze conform and tape Electronic Signature(s) Signed: 01/02/2018 6:21:04 PM By: Gretta Cool, BSN, RN, CWS, Kim RN, BSN Entered By: Gretta Cool, BSN, RN, CWS, Kim on 01/02/2018 08:47:27 Grassia, [name] ([id number]) -------------------------------------------------------------------------------- Wound Assessment Details Patient Name: Trueba, Quintarius E. Date of Service: 01/02/2018 8:30 AM Medical Record Number: [id number] Patient Account Number: [id number] Date of Birth/Sex: 16-Jul-1950 (67 y.o. M) Treating RN: Cornell Barman Primary Care Keeanna Villafranca: Lamonte Sakai Other Clinician: Referring Shondell Poulson: Lamonte Sakai Treating Jace Fermin/Extender: Melburn Hake, HOYT Weeks in Treatment: 66 Wound Status Wound Number: 16 Primary Diabetic Wound/Ulcer of the Lower Etiology: Extremity Wound Location: Right, Plantar Toe Great Wound Status: Open Wounding Event: Not Known Date Acquired: 10/29/2017 Weeks Of Treatment: 9 Clustered Wound: No Photos Photo Uploaded By: Secundino Ginger on 01/02/2018 10:47:08 Wound Measurements Length: (cm) 0.1 Width:  (cm) 0.1 Depth: (cm) 0.1 Area: (cm) 0.008 Volume: (cm) 0.001 % Reduction in Area: 99.5% % Reduction in Volume: 99.4% Wound Description Classification: Grade 1 Periwound Skin Texture Texture Color No Abnormalities Noted: No No Abnormalities Noted: No Moisture No Abnormalities Noted: No Treatment Notes Wound #16 (Right, Plantar Toe Great) 1. Cleansed with: Clean wound with Normal Saline 2. Anesthetic Topical Lidocaine 4% cream to wound bed prior to debridement 4. Dressing Applied: DEVAN, DANZER ([id number]) Other dressing (specify in notes) Notes silvercell, gauze conform and tape Electronic Signature(s) Signed: 01/02/2018 6:21:04 PM By: Gretta Cool, BSN, RN, CWS, Kim RN, BSN Entered By: Gretta Cool, BSN, RN, CWS, Kim on 01/02/2018 08:47:27 Ganesh, [name] ([id number]) -------------------------------------------------------------------------------- Wound Assessment Details Patient Name: Chea, Rhyker E. Date of Service: 01/02/2018 8:30 AM Medical Record Number: [id number] Patient Account Number: [id number] Date of Birth/Sex: 03-May-1950 (67 y.o. M) Treating RN: Cornell Barman Primary Care Medardo Hassing: Lamonte Sakai Other Clinician: Referring Chandrea Zellman: Lamonte Sakai Treating Woodley Petzold/Extender: Melburn Hake, HOYT Weeks in Treatment: 66 Wound Status Wound Number: 2 Primary Diabetic Wound/Ulcer of the Lower Etiology: Extremity Wound Location: Right Toe Second Wound Status: Open Wounding Event: Gradually Appeared Date Acquired: 06/11/2016 Weeks Of Treatment: 66 Clustered Wound: No Pending Amputation On Presentation Photos Photo Uploaded By: Secundino Ginger on 01/02/2018 10:48:35 Wound Measurements Length: (cm) 1 Width: (cm) 2.4 Depth: (cm) 0.1 Area: (cm) 1.885 Volume: (cm) 0.188 % Reduction in Area: 38.6% % Reduction in Volume: 38.8% Wound Description Classification: Grade 2 Periwound Skin Texture Texture Color No Abnormalities Noted: No No Abnormalities Noted: No Moisture No  Abnormalities Noted: No Treatment Notes Wound #2 (Right Toe Second) 1. Cleansed with: Clean wound with Normal Saline 2. Anesthetic Topical Lidocaine 4% cream to wound bed prior to debridement Linhart, Helder E. ([id number]) 4. Dressing Applied: Other dressing (specify in notes) Notes silvercell, gauze conform and tape Electronic Signature(s) Signed: 01/02/2018 6:21:04 PM By: Gretta Cool, BSN, RN, CWS, Kim RN, BSN Entered By: Gretta Cool, BSN, RN, CWS, Kim on 01/02/2018 08:47:27 Faro, [name] ([id number]) -------------------------------------------------------------------------------- Wound Assessment Details Patient Name: Rufo, Whit E. Date of Service: 01/02/2018 8:30 AM Medical Record Number: [id number] Patient Account Number: [id number] Date of Birth/Sex: March 19, 1951 (67  y.o. M) Treating RN: Cornell Barman Primary Care Kena Limon: Lamonte Sakai Other Clinician: Referring Maanvi Lecompte: Lamonte Sakai Treating Shonette Rhames/Extender: Melburn Hake, HOYT Weeks in Treatment: 66 Wound Status Wound Number: 5 Primary Diabetic Wound/Ulcer of the Lower Etiology: Extremity Wound Location: Left Toe Great Wound Status: Open Wounding Event: Gradually Appeared Date Acquired: 06/11/2016 Weeks Of Treatment: 66 Clustered Wound: No Pending Amputation On Presentation Photos Photo Uploaded By: Secundino Ginger on 01/02/2018 10:47:48 Wound Measurements Length: (cm) 0.1 Width: (cm) 0.1 Depth: (cm) 0.1 Area: (cm) 0.008 Volume: (cm) 0.001 % Reduction in Area: 99.9% % Reduction in Volume: 99.9% Wound Description Classification: Grade 2 Periwound Skin Texture Texture Color No Abnormalities Noted: No No Abnormalities Noted: No Moisture No Abnormalities Noted: No Treatment Notes Wound #5 (Left Toe Great) 1. Cleansed with: Clean wound with Normal Saline 2. Anesthetic Topical Lidocaine 4% cream to wound bed prior to debridement Delk, Declan E. ([id number]) 4. Dressing Applied: Other dressing (specify in  notes) Notes silvercell, gauze conform and tape Electronic Signature(s) Signed: 01/02/2018 6:21:04 PM By: Gretta Cool, BSN, RN, CWS, Kim RN, BSN Entered By: Gretta Cool, BSN, RN, CWS, Kim on 01/02/2018 08:47:27 Bellomo, [name] ([id number]) -------------------------------------------------------------------------------- Vitals Details Patient Name: Medal, Brinton E. Date of Service: 01/02/2018 8:30 AM Medical Record Number: [id number] Patient Account Number: [id number] Date of Birth/Sex: Dec 30, 1950 (67 y.o. M) Treating RN: Roger Shelter Primary Care Katee Wentland: Lamonte Sakai Other Clinician: Referring Braden Cimo: Lamonte Sakai Treating Kalen Ratajczak/Extender: Melburn Hake, HOYT Weeks in Treatment: 69 Vital Signs Time Taken: 08:40 Temperature (F): 97.8 Height (in): 69 Pulse (bpm): 80 Weight (lbs): 168 Respiratory Rate (breaths/min): 16 Body Mass Index (BMI): 24.8 Blood Pressure (mmHg): 158/81 Reference Range: 80 - 120 mg / dl Electronic Signature(s) Signed: 01/02/2018 9:10:12 AM By: Lorine Bears RCP, RRT, CHT Entered By: Lorine Bears on 01/02/2018 08:40:18

## 2018-01-10 ENCOUNTER — Inpatient Hospital Stay: Payer: Medicare HMO | Attending: Oncology

## 2018-01-10 ENCOUNTER — Inpatient Hospital Stay: Payer: Medicare HMO

## 2018-01-10 DIAGNOSIS — D472 Monoclonal gammopathy: Secondary | ICD-10-CM | POA: Diagnosis present

## 2018-01-10 DIAGNOSIS — Z923 Personal history of irradiation: Secondary | ICD-10-CM | POA: Diagnosis not present

## 2018-01-10 DIAGNOSIS — I130 Hypertensive heart and chronic kidney disease with heart failure and stage 1 through stage 4 chronic kidney disease, or unspecified chronic kidney disease: Secondary | ICD-10-CM | POA: Insufficient documentation

## 2018-01-10 DIAGNOSIS — D631 Anemia in chronic kidney disease: Secondary | ICD-10-CM | POA: Diagnosis not present

## 2018-01-10 DIAGNOSIS — K7460 Unspecified cirrhosis of liver: Secondary | ICD-10-CM | POA: Insufficient documentation

## 2018-01-10 DIAGNOSIS — Z85828 Personal history of other malignant neoplasm of skin: Secondary | ICD-10-CM | POA: Insufficient documentation

## 2018-01-10 DIAGNOSIS — F17210 Nicotine dependence, cigarettes, uncomplicated: Secondary | ICD-10-CM | POA: Diagnosis not present

## 2018-01-10 DIAGNOSIS — E1122 Type 2 diabetes mellitus with diabetic chronic kidney disease: Secondary | ICD-10-CM | POA: Insufficient documentation

## 2018-01-10 DIAGNOSIS — Z79899 Other long term (current) drug therapy: Secondary | ICD-10-CM | POA: Diagnosis not present

## 2018-01-10 DIAGNOSIS — N189 Chronic kidney disease, unspecified: Secondary | ICD-10-CM

## 2018-01-10 LAB — CBC
HEMATOCRIT: 31 % — AB (ref 40.0–52.0)
HEMOGLOBIN: 10.4 g/dL — AB (ref 13.0–18.0)
MCH: 28.2 pg (ref 26.0–34.0)
MCHC: 33.5 g/dL (ref 32.0–36.0)
MCV: 84.2 fL (ref 80.0–100.0)
Platelets: 352 10*3/uL (ref 150–440)
RBC: 3.68 MIL/uL — ABNORMAL LOW (ref 4.40–5.90)
RDW: 14.2 % (ref 11.5–14.5)
WBC: 8.2 10*3/uL (ref 3.8–10.6)

## 2018-01-13 LAB — MULTIPLE MYELOMA PANEL, SERUM
Albumin SerPl Elph-Mcnc: 2.9 g/dL (ref 2.9–4.4)
Albumin/Glob SerPl: 0.7 (ref 0.7–1.7)
Alpha 1: 0.3 g/dL (ref 0.0–0.4)
Alpha2 Glob SerPl Elph-Mcnc: 0.9 g/dL (ref 0.4–1.0)
B-Globulin SerPl Elph-Mcnc: 1 g/dL (ref 0.7–1.3)
Gamma Glob SerPl Elph-Mcnc: 2.2 g/dL — ABNORMAL HIGH (ref 0.4–1.8)
Globulin, Total: 4.3 g/dL — ABNORMAL HIGH (ref 2.2–3.9)
IgA: 303 mg/dL (ref 61–437)
IgG (Immunoglobin G), Serum: 2419 mg/dL — ABNORMAL HIGH (ref 700–1600)
IgM (Immunoglobulin M), Srm: 109 mg/dL (ref 20–172)
M Protein SerPl Elph-Mcnc: 1 g/dL — ABNORMAL HIGH
Total Protein ELP: 7.2 g/dL (ref 6.0–8.5)

## 2018-01-13 LAB — KAPPA/LAMBDA LIGHT CHAINS
Kappa free light chain: 139.2 mg/L — ABNORMAL HIGH (ref 3.3–19.4)
Kappa, lambda light chain ratio: 1.62 (ref 0.26–1.65)
Lambda free light chains: 85.7 mg/L — ABNORMAL HIGH (ref 5.7–26.3)

## 2018-01-14 ENCOUNTER — Ambulatory Visit (INDEPENDENT_AMBULATORY_CARE_PROVIDER_SITE_OTHER): Payer: Medicare HMO | Admitting: General Surgery

## 2018-01-14 ENCOUNTER — Encounter: Payer: Self-pay | Admitting: General Surgery

## 2018-01-14 VITALS — BP 142/72 | HR 76 | Resp 14 | Ht 72.0 in | Wt 179.0 lb

## 2018-01-14 DIAGNOSIS — C44519 Basal cell carcinoma of skin of other part of trunk: Secondary | ICD-10-CM

## 2018-01-14 MED ORDER — SULFAMETHOXAZOLE-TRIMETHOPRIM 800-160 MG PO TABS: 1.0000 | ORAL_TABLET | Freq: Two times a day (BID) | ORAL | 0 refills | Status: DC

## 2018-01-14 MED ORDER — SILVER SULFADIAZINE 1 % EX CREA: TOPICAL_CREAM | CUTANEOUS | 1 refills | Status: DC

## 2018-01-14 NOTE — Patient Instructions (Addendum)
Return in two weeks. Rx sent. The patient is aware to call back for any questions or concerns.

## 2018-01-14 NOTE — Progress Notes (Signed)
Patient ID: [name], male   DOB: [birth year], 67 y.o.   MRN: [id number]  Chief Complaint  Patient presents with  . Routine Post Op    HPI [name] is a 67 y.o. male here today for his post op excision buttocks done on 12/16/2017. He states the area is bleeding some and sore to sit down.  Moves his bowels every three days.  Brother in law is present at visit.  HPI  Past Medical History:  Diagnosis Date  . Anemia   . Cancer ([place]) 11/02/2014   PERIANAL MASS, RIGHT; INCISIONAL BIOPSY: INVASIVE CARCINOMA WITH BASALOID FEATURES  . CHF (congestive heart failure) ([place])   . Chronic kidney disease   . COPD (chronic obstructive pulmonary disease) ([place])    NO INHALERS  . Diabetes mellitus    NO MEDS  . GERD (gastroesophageal reflux disease)   . Hepatic cirrhosis ([place])   . Hypertension   . Leg swelling   . Loose, teeth    PATIENT STATES "HAS 6 TEETH, SOME ARE LOOSE"  . Neuromuscular disorder ([place])    RIGHT HAND AND FINGERS NUMB  . Shortness of breath dyspnea    WITH EXERTION   . 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 APPENDECTOMY    . COLONOSCOPY WITH PROPOFOL N/A 11/19/2014   Procedure: COLONOSCOPY WITH PROPOFOL;  Surgeon: Lucilla Lame, MD;  Location: [facility];  Service: Endoscopy;  Laterality: N/A;  DIABETIC-ORAL MEDS  . COLONOSCOPY WITH PROPOFOL N/A 10/04/2016   Procedure: COLONOSCOPY WITH PROPOFOL;  Surgeon: Jonathon Bellows, MD;  Location[facility] ENDOSCOPY;  Service: Endoscopy;  Laterality: N/A;  . ESOPHAGOGASTRODUODENOSCOPY (EGD) WITH PROPOFOL N/A 10/04/2016   Procedure: ESOPHAGOGASTRODUODENOSCOPY (EGD) WITH PROPOFOL;  Surgeon: Jonathon Bellows, MD;  Location: [facility] ENDOSCOPY;  Service: Endoscopy;  Laterality: N/A;  . GIVENS CAPSULE STUDY N/A 11/13/2016   Procedure: GIVENS CAPSULE STUDY;  Surgeon: Jonathon Bellows, MD;  Location: [facility] ENDOSCOPY;  Service: Gastroenterology;  Laterality: N/A;  . INCISION AND DRAINAGE  PERIRECTAL ABSCESS Right 12/16/2017   Procedure: EXCISION BASAL CELL CANCER/ GLUTEAL ABSCESS AND FLAP COVERAGE;  Surgeon: Robert Bellow, MD;  Location: [facility] ORS;  Service: General;  Laterality: Right;  . INGUINAL LYMPH NODE BIOPSY Right 01/26/2016   Procedure: INGUINAL LYMPH NODE BIOPSY;  Surgeon: Clayburn Pert, MD;  Location: [facility] ORS;  Service: General;  Laterality: Right;  . RECTAL BIOPSY N/A 11/02/2014   Procedure: BIOPSY RECTAL;  Surgeon: Marlyce Huge, MD;  Location: [facility] ORS;  Service: General;  Laterality: N/A;  . RECTAL EXAM UNDER ANESTHESIA N/A 11/02/2014   Procedure: RECTAL EXAM UNDER ANESTHESIA;  Surgeon: Marlyce Huge, MD;  Location: [facility] ORS;  Service: General;  Laterality: N/A;  . TONSILLECTOMY      Family History  Problem Relation Age of Onset  . Asthma Mother   . Colon cancer Neg Hx     Social History Social History   Tobacco Use  . Smoking status: Current Every Day Smoker    Packs/day: 0.50    Years: 20.00    Pack years: 10.00    Types: Cigarettes  . Smokeless tobacco: Never Used  . Tobacco comment: 1 PACK EVERY 2 DAYS  Substance Use Topics  . Alcohol use: Not Currently  . Drug use: Yes    Types: Marijuana    Comment: Last Use 2016 per patient-+ UDS FOR COCAINE IN 2014    No Known Allergies  Current Outpatient Medications  Medication Sig Dispense Refill  . amLODipine (NORVASC) 10 MG tablet Take 10 mg by mouth every morning.     Marland Kitchen atorvastatin (LIPITOR) 40 MG tablet Take 40 mg by mouth every morning.     . bismuth subsalicylate (PEPTO BISMOL) 262 MG/15ML suspension Take 30 mLs by mouth every 6 (six) hours as needed for indigestion or diarrhea or loose stools.     . chlorthalidone (HYGROTON) 25 MG tablet Take 25 mg by mouth 2 (two) times daily.     . ferrous sulfate 325 (65 FE) MG tablet Take 325 mg by mouth daily.     . hydrALAZINE (APRESOLINE) 25 MG tablet Take 25 mg 2 (two) times daily by mouth.    Marland Kitchen HYDROcodone-acetaminophen  (NORCO/VICODIN) 5-325 MG tablet Take 1 tablet by mouth every 4 (four) hours as needed for moderate pain. 20 tablet 0  . omeprazole (PRILOSEC) 40 MG capsule Take 40 mg by mouth every morning.     . polyethylene glycol (MIRALAX / GLYCOLAX) packet Take 17 g by mouth daily as needed for mild constipation.     . silver sulfADIAZINE (SILVADENE) 1 % cream Apply to affected area twice daily 400 g 1  . sulfamethoxazole-trimethoprim (BACTRIM DS,SEPTRA DS) 800-160 MG tablet Take 1 tablet by mouth 2 (two) times daily. 30 tablet 0  . vitamin B-12 (CYANOCOBALAMIN) 1000 MCG tablet Take 1 tablet (1,000 mcg total) by mouth daily. 30 tablet 3  . Vitamin D, Ergocalciferol, (DRISDOL) 50000 units CAPS capsule Take 50,000 Units by mouth every Tuesday.      No current facility-administered medications for this visit.    Facility-Administered Medications Ordered in Other Visits  Medication Dose Route Frequency Provider Last Rate Last Dose  . Darbepoetin Alfa (ARANESP) injection 100 mcg  100 mcg Subcutaneous Q30 days Sindy Guadeloupe, MD   100 mcg at 07/25/17 0924  . Darbepoetin Alfa (ARANESP) injection 100 mcg  100 mcg Subcutaneous Q30 days Sindy Guadeloupe, MD   100 mcg at 08/22/17 1015  . epoetin alfa (EPOGEN,PROCRIT) injection 40,000 Units  40,000 Units Subcutaneous Weekly Sindy Guadeloupe, MD   40,000 Units at 05/23/17 1145    Review of Systems Review of Systems  Constitutional: Negative.   Respiratory: Negative.   Cardiovascular: Negative.     Blood pressure (!) 142/72, pulse 76, resp. rate 14, height 6' (1.829 m), weight 179 lb (81.2 kg).  Physical Exam Physical Exam  Constitutional: He is oriented to person, place, and time. He appears well-developed and well-nourished.  Genitourinary:     Neurological: He is alert and oriented to person, place, and time.  Skin: Skin is warm and dry.       Assessment    Wound healing by secondary intent post excision basal cell carcinoma.    Plan  The patient  will make use of Silvadene cream twice a day.  Will place on a 2-week course of Bactrim due to the mild cellulitis involving the surgical site. Return in two weeks . Rx sent pharmacy .   HPI, Physical Exam, Assessment and Plan have been scribed under the direction and in the presence of Hervey Ard, MD.  Gaspar Cola, CMA  I have completed the exam and reviewed the above documentation for accuracy and completeness.  I agree with the above.  Haematologist has been used and any errors in dictation or transcription are unintentional.  Hervey Ard, M.D., F.A.C.S.  Forest Gleason Charlesetta Milliron 01/14/2018, 8:28 PM

## 2018-01-21 ENCOUNTER — Other Ambulatory Visit: Payer: Self-pay

## 2018-01-21 ENCOUNTER — Telehealth: Payer: Self-pay | Admitting: *Deleted

## 2018-01-21 ENCOUNTER — Inpatient Hospital Stay
Admission: EM | Admit: 2018-01-21 | Discharge: 2018-01-24 | DRG: 683 | Disposition: A | Discharge status: Home / Self Care | Payer: Medicare HMO | Attending: Internal Medicine | Admitting: Internal Medicine

## 2018-01-21 ENCOUNTER — Encounter: Payer: Self-pay | Admitting: Emergency Medicine

## 2018-01-21 ENCOUNTER — Inpatient Hospital Stay: Payer: Medicare HMO

## 2018-01-21 ENCOUNTER — Emergency Department: Payer: Medicare HMO

## 2018-01-21 ENCOUNTER — Other Ambulatory Visit: Payer: Self-pay | Admitting: Oncology

## 2018-01-21 DIAGNOSIS — D631 Anemia in chronic kidney disease: Secondary | ICD-10-CM | POA: Diagnosis present

## 2018-01-21 DIAGNOSIS — T8149XA Infection following a procedure, other surgical site, initial encounter: Secondary | ICD-10-CM | POA: Diagnosis present

## 2018-01-21 DIAGNOSIS — D472 Monoclonal gammopathy: Secondary | ICD-10-CM | POA: Diagnosis present

## 2018-01-21 DIAGNOSIS — E875 Hyperkalemia: Secondary | ICD-10-CM | POA: Diagnosis present

## 2018-01-21 DIAGNOSIS — N184 Chronic kidney disease, stage 4 (severe): Secondary | ICD-10-CM | POA: Diagnosis present

## 2018-01-21 DIAGNOSIS — C44590 Other specified malignant neoplasm of anal skin: Secondary | ICD-10-CM | POA: Diagnosis present

## 2018-01-21 DIAGNOSIS — M25511 Pain in right shoulder: Secondary | ICD-10-CM | POA: Diagnosis present

## 2018-01-21 DIAGNOSIS — M19011 Primary osteoarthritis, right shoulder: Secondary | ICD-10-CM | POA: Diagnosis present

## 2018-01-21 DIAGNOSIS — J449 Chronic obstructive pulmonary disease, unspecified: Secondary | ICD-10-CM | POA: Diagnosis present

## 2018-01-21 DIAGNOSIS — K7030 Alcoholic cirrhosis of liver without ascites: Secondary | ICD-10-CM | POA: Diagnosis present

## 2018-01-21 DIAGNOSIS — Z825 Family history of asthma and other chronic lower respiratory diseases: Secondary | ICD-10-CM | POA: Diagnosis not present

## 2018-01-21 DIAGNOSIS — E785 Hyperlipidemia, unspecified: Secondary | ICD-10-CM | POA: Diagnosis present

## 2018-01-21 DIAGNOSIS — F17210 Nicotine dependence, cigarettes, uncomplicated: Secondary | ICD-10-CM | POA: Diagnosis present

## 2018-01-21 DIAGNOSIS — E1122 Type 2 diabetes mellitus with diabetic chronic kidney disease: Secondary | ICD-10-CM | POA: Diagnosis present

## 2018-01-21 DIAGNOSIS — Z79899 Other long term (current) drug therapy: Secondary | ICD-10-CM

## 2018-01-21 DIAGNOSIS — I10 Essential (primary) hypertension: Secondary | ICD-10-CM

## 2018-01-21 DIAGNOSIS — N179 Acute kidney failure, unspecified: Secondary | ICD-10-CM | POA: Diagnosis not present

## 2018-01-21 DIAGNOSIS — I509 Heart failure, unspecified: Secondary | ICD-10-CM | POA: Diagnosis present

## 2018-01-21 DIAGNOSIS — B1920 Unspecified viral hepatitis C without hepatic coma: Secondary | ICD-10-CM | POA: Diagnosis present

## 2018-01-21 DIAGNOSIS — I130 Hypertensive heart and chronic kidney disease with heart failure and stage 1 through stage 4 chronic kidney disease, or unspecified chronic kidney disease: Secondary | ICD-10-CM | POA: Diagnosis present

## 2018-01-21 DIAGNOSIS — K219 Gastro-esophageal reflux disease without esophagitis: Secondary | ICD-10-CM | POA: Diagnosis present

## 2018-01-21 DIAGNOSIS — D508 Other iron deficiency anemias: Secondary | ICD-10-CM

## 2018-01-21 DIAGNOSIS — M7591 Shoulder lesion, unspecified, right shoulder: Secondary | ICD-10-CM | POA: Diagnosis present

## 2018-01-21 LAB — CBC WITH DIFFERENTIAL/PLATELET
Abs Immature Granulocytes: 0.07 10*3/uL (ref 0.00–0.07)
Abs Immature Granulocytes: 0.05 K/uL (ref 0.00–0.07)
BASOS ABS: 0 10*3/uL (ref 0.0–0.1)
Basophils Absolute: 0.1 K/uL (ref 0.0–0.1)
Basophils Relative: 1 %
Basophils Relative: 1 %
EOS ABS: 0.2 10*3/uL (ref 0.0–0.5)
Eosinophils Absolute: 0.2 K/uL (ref 0.0–0.5)
Eosinophils Relative: 3 %
Eosinophils Relative: 2 %
HCT: 28.9 % — ABNORMAL LOW (ref 39.0–52.0)
HEMATOCRIT: 27.2 % — AB (ref 39.0–52.0)
Hemoglobin: 8.7 g/dL — ABNORMAL LOW (ref 13.0–17.0)
Hemoglobin: 9.4 g/dL — ABNORMAL LOW (ref 13.0–17.0)
IMMATURE GRANULOCYTES: 1 %
Immature Granulocytes: 1 %
LYMPHS ABS: 1.1 10*3/uL (ref 0.7–4.0)
Lymphocytes Relative: 16 %
Lymphocytes Relative: 13 %
Lymphs Abs: 1.1 K/uL (ref 0.7–4.0)
MCH: 27.1 pg (ref 26.0–34.0)
MCH: 28.1 pg (ref 26.0–34.0)
MCHC: 32 g/dL (ref 30.0–36.0)
MCHC: 32.5 g/dL (ref 30.0–36.0)
MCV: 84.7 fL (ref 80.0–100.0)
MCV: 86.3 fL (ref 80.0–100.0)
MONO ABS: 0.8 10*3/uL (ref 0.1–1.0)
MONOS PCT: 11 %
Monocytes Absolute: 0.8 K/uL (ref 0.1–1.0)
Monocytes Relative: 9 %
Neutro Abs: 4.9 10*3/uL (ref 1.7–7.7)
Neutro Abs: 6.2 K/uL (ref 1.7–7.7)
Neutrophils Relative %: 68 %
Neutrophils Relative %: 74 %
Platelets: 200 10*3/uL (ref 150–400)
Platelets: 227 K/uL (ref 150–400)
RBC: 3.21 MIL/uL — ABNORMAL LOW (ref 4.22–5.81)
RBC: 3.35 MIL/uL — ABNORMAL LOW (ref 4.22–5.81)
RDW: 14.4 % (ref 11.5–15.5)
RDW: 14.5 % (ref 11.5–15.5)
WBC: 7 10*3/uL (ref 4.0–10.5)
WBC: 8.4 K/uL (ref 4.0–10.5)
nRBC: 0 % (ref 0.0–0.2)
nRBC: 0 % (ref 0.0–0.2)

## 2018-01-21 LAB — COMPREHENSIVE METABOLIC PANEL
ALBUMIN: 3.2 g/dL — AB (ref 3.5–5.0)
ALK PHOS: 92 U/L (ref 38–126)
ALT: 36 U/L (ref 0–44)
AST: 46 U/L — AB (ref 15–41)
Anion gap: 9 (ref 5–15)
BILIRUBIN TOTAL: 0.7 mg/dL (ref 0.3–1.2)
BUN: 58 mg/dL — AB (ref 8–23)
CO2: 19 mmol/L — AB (ref 22–32)
Calcium: 8.5 mg/dL — ABNORMAL LOW (ref 8.9–10.3)
Chloride: 102 mmol/L (ref 98–111)
Creatinine, Ser: 3.37 mg/dL — ABNORMAL HIGH (ref 0.61–1.24)
GFR calc Af Amer: 21 mL/min — ABNORMAL LOW (ref >60)
GFR calc non Af Amer: 18 mL/min — ABNORMAL LOW (ref >60)
GLUCOSE: 168 mg/dL — AB (ref 70–99)
POTASSIUM: 5.1 mmol/L (ref 3.5–5.1)
Sodium: 130 mmol/L — ABNORMAL LOW (ref 135–145)
TOTAL PROTEIN: 8.4 g/dL — AB (ref 6.5–8.1)

## 2018-01-21 LAB — SAMPLE TO BLOOD BANK

## 2018-01-21 LAB — TROPONIN I: Troponin I: <0.03 ng/mL (ref <0.03)

## 2018-01-21 MED ORDER — CHLORTHALIDONE 25 MG PO TABS
25.0000 mg | ORAL_TABLET | Freq: Two times a day (BID) | ORAL | Status: DC
  Administered 2018-01-22 – 2018-01-24 (×5): 25 mg via ORAL
  Filled 2018-01-21 (×7): qty 1

## 2018-01-21 MED ORDER — ONDANSETRON HCL 4 MG PO TABS: 4.0000 mg | ORAL_TABLET | Freq: Four times a day (QID) | ORAL | Status: DC | PRN

## 2018-01-21 MED ORDER — ONDANSETRON HCL 4 MG/2ML IJ SOLN
4.0000 mg | Freq: Once | INTRAMUSCULAR | Status: AC
  Administered 2018-01-21: 4 mg via INTRAVENOUS
  Filled 2018-01-21: qty 2

## 2018-01-21 MED ORDER — SODIUM CHLORIDE 0.9 % IV BOLUS
1000.0000 mL | Freq: Once | INTRAVENOUS | Status: AC
  Administered 2018-01-21: 1000 mL via INTRAVENOUS

## 2018-01-21 MED ORDER — CHLORTHALIDONE 25 MG PO TABS
25.0000 mg | ORAL_TABLET | Freq: Every day | ORAL | Status: DC
  Administered 2018-01-21: 25 mg via ORAL
  Filled 2018-01-21: qty 1

## 2018-01-21 MED ORDER — HYDROCODONE-ACETAMINOPHEN 5-325 MG PO TABS
1.0000 | ORAL_TABLET | ORAL | Status: DC | PRN
  Administered 2018-01-21: 2 via ORAL
  Administered 2018-01-22: 1 via ORAL
  Administered 2018-01-23: 2 via ORAL
  Filled 2018-01-21 (×2): qty 2
  Filled 2018-01-21: qty 1

## 2018-01-21 MED ORDER — HYDRALAZINE HCL 50 MG PO TABS
25.0000 mg | ORAL_TABLET | Freq: Once | ORAL | Status: AC
  Administered 2018-01-21: 25 mg via ORAL
  Filled 2018-01-21: qty 1

## 2018-01-21 MED ORDER — AMLODIPINE BESYLATE 10 MG PO TABS
10.0000 mg | ORAL_TABLET | ORAL | Status: DC
  Administered 2018-01-22 – 2018-01-24 (×3): 10 mg via ORAL
  Filled 2018-01-21 (×3): qty 1

## 2018-01-21 MED ORDER — PANTOPRAZOLE SODIUM 40 MG PO TBEC
40.0000 mg | DELAYED_RELEASE_TABLET | Freq: Every day | ORAL | Status: DC
  Administered 2018-01-22 – 2018-01-24 (×3): 40 mg via ORAL
  Filled 2018-01-21 (×3): qty 1

## 2018-01-21 MED ORDER — VITAMIN B-12 1000 MCG PO TABS
1000.0000 ug | ORAL_TABLET | Freq: Every day | ORAL | Status: DC
  Administered 2018-01-22 – 2018-01-24 (×3): 1000 ug via ORAL
  Filled 2018-01-21 (×3): qty 1

## 2018-01-21 MED ORDER — DOCUSATE SODIUM 100 MG PO CAPS
100.0000 mg | ORAL_CAPSULE | Freq: Two times a day (BID) | ORAL | Status: DC
  Administered 2018-01-21 – 2018-01-24 (×6): 100 mg via ORAL
  Filled 2018-01-21 (×6): qty 1

## 2018-01-21 MED ORDER — HYDRALAZINE HCL 50 MG PO TABS
25.0000 mg | ORAL_TABLET | Freq: Two times a day (BID) | ORAL | Status: DC
  Administered 2018-01-22 – 2018-01-24 (×5): 25 mg via ORAL
  Filled 2018-01-21 (×5): qty 1

## 2018-01-21 MED ORDER — HEPARIN SODIUM (PORCINE) 5000 UNIT/ML IJ SOLN
5000.0000 [IU] | Freq: Three times a day (TID) | INTRAMUSCULAR | Status: DC
  Administered 2018-01-22 – 2018-01-24 (×7): 5000 [IU] via SUBCUTANEOUS
  Filled 2018-01-21 (×7): qty 1

## 2018-01-21 MED ORDER — ATORVASTATIN CALCIUM 20 MG PO TABS
40.0000 mg | ORAL_TABLET | ORAL | Status: DC
  Administered 2018-01-22 – 2018-01-24 (×3): 40 mg via ORAL
  Filled 2018-01-21 (×3): qty 2

## 2018-01-21 MED ORDER — SODIUM CHLORIDE 0.9 % IV SOLN
INTRAVENOUS | Status: DC
  Administered 2018-01-21 – 2018-01-24 (×4): via INTRAVENOUS

## 2018-01-21 MED ORDER — ACETAMINOPHEN 325 MG PO TABS: 650.0000 mg | ORAL_TABLET | Freq: Four times a day (QID) | ORAL | Status: DC | PRN

## 2018-01-21 MED ORDER — MORPHINE SULFATE (PF) 4 MG/ML IV SOLN
4.0000 mg | Freq: Once | INTRAVENOUS | Status: AC
  Administered 2018-01-21: 4 mg via INTRAVENOUS
  Filled 2018-01-21: qty 1

## 2018-01-21 MED ORDER — BISACODYL 5 MG PO TBEC: 5.0000 mg | DELAYED_RELEASE_TABLET | Freq: Every day | ORAL | Status: DC | PRN

## 2018-01-21 MED ORDER — FERROUS SULFATE 325 (65 FE) MG PO TABS
325.0000 mg | ORAL_TABLET | Freq: Every day | ORAL | Status: DC
  Administered 2018-01-22 – 2018-01-24 (×3): 325 mg via ORAL
  Filled 2018-01-21 (×3): qty 1

## 2018-01-21 MED ORDER — TRAZODONE HCL 50 MG PO TABS: 25.0000 mg | ORAL_TABLET | Freq: Every evening | ORAL | Status: DC | PRN

## 2018-01-21 MED ORDER — ONDANSETRON HCL 4 MG/2ML IJ SOLN: 4.0000 mg | Freq: Four times a day (QID) | INTRAMUSCULAR | Status: DC | PRN

## 2018-01-21 MED ORDER — ACETAMINOPHEN 650 MG RE SUPP: 650.0000 mg | Freq: Four times a day (QID) | RECTAL | Status: DC | PRN

## 2018-01-21 MED ORDER — VITAMIN D (ERGOCALCIFEROL) 1.25 MG (50000 UNIT) PO CAPS: 50000.0000 [IU] | ORAL_CAPSULE | ORAL | Status: DC

## 2018-01-21 NOTE — ED Notes (Signed)
MD at bedside updating family on plan of care.

## 2018-01-21 NOTE — H&P (Signed)
[facility] [place] NAME: [name]    MR#:  [id number]  DATE OF BIRTH:  10/18/1950  DATE OF ADMISSION:  01/21/2018  PRIMARY CARE PHYSICIAN: [name], MD   REQUESTING/REFERRING PHYSICIAN:   CHIEF COMPLAINT:   Chief Complaint  Patient presents with  . Shoulder Pain  . Hypertension    HISTORY OF PRESENT ILLNESS: [name]  is a 67 y.o. male with a known history of chronic anemia, CKD, COPD, diabetes type 2, liver cirrhosis, hypertension, CHF. Patient presented to emergency room for severe, constant pain to the right shoulder, going on for the past week or so, gradually getting worse.  The pain is worse with right arm abduction.  No weakness or swelling in the right upper extremity.  He denies any recent trauma.  No similar episodes in the past.  Patient has been taking over-the-counter Advil 3 times a day every day in the past week with minimal relief in his pain. Blood test done emergency room are notable for elevated creatinine level at 3.37, hemoglobin 9.4, BUN 58. Right shoulder x-ray shows narrowed humeral acromial distance which can be associated with rotator cuff disease. Chest x-ray is negative for any acute abnormalities. Patient is admitted for further evaluation and treatment.  PAST MEDICAL HISTORY:   Past Medical History:  Diagnosis Date  . Anemia   . Cancer ([place]) 11/02/2014   PERIANAL MASS, RIGHT; INCISIONAL BIOPSY: INVASIVE CARCINOMA WITH BASALOID FEATURES  . CHF (congestive heart failure) ([place])   . Chronic kidney disease   . COPD (chronic obstructive pulmonary disease) ([place])    NO INHALERS  . Diabetes mellitus    NO MEDS  . GERD (gastroesophageal reflux disease)   . Hepatic cirrhosis ([place])   . Hypertension   . Leg swelling   . Loose, teeth    PATIENT STATES "HAS 6 TEETH, SOME ARE LOOSE"  . Neuromuscular disorder ([place])    RIGHT HAND AND FINGERS NUMB  . Shortness of breath dyspnea    WITH EXERTION    . 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 APPENDECTOMY    . COLONOSCOPY WITH PROPOFOL N/A 11/19/2014   Procedure: COLONOSCOPY WITH PROPOFOL;  Surgeon: Lucilla Lame, MD;  Location: [facility];  Service: Endoscopy;  Laterality: N/A;  DIABETIC-ORAL MEDS  . COLONOSCOPY WITH PROPOFOL N/A 10/04/2016   Procedure: COLONOSCOPY WITH PROPOFOL;  Surgeon: Jonathon Bellows, MD;  Location: [facility] ENDOSCOPY;  Service: Endoscopy;  Laterality: N/A;  . ESOPHAGOGASTRODUODENOSCOPY (EGD) WITH PROPOFOL N/A 10/04/2016   Procedure: ESOPHAGOGASTRODUODENOSCOPY (EGD) WITH PROPOFOL;  Surgeon: Jonathon Bellows, MD;  Location: [facility] ENDOSCOPY;  Service: Endoscopy;  Laterality: N/A;  . GIVENS CAPSULE STUDY N/A 11/13/2016   Procedure: GIVENS CAPSULE STUDY;  Surgeon: Jonathon Bellows, MD;  Location: [facility] ENDOSCOPY;  Service: Gastroenterology;  Laterality: N/A;  . INCISION AND DRAINAGE PERIRECTAL ABSCESS Right 12/16/2017   Procedure: EXCISION BASAL CELL CANCER/ GLUTEAL ABSCESS AND FLAP COVERAGE;  Surgeon: Robert Bellow, MD;  Location: [facility] ORS;  Service: General;  Laterality: Right;  . INGUINAL LYMPH NODE BIOPSY Right 01/26/2016   Procedure: INGUINAL LYMPH NODE BIOPSY;  Surgeon: Clayburn Pert, MD;  Location: [facility] ORS;  Service: General;  Laterality: Right;  . RECTAL BIOPSY N/A 11/02/2014   Procedure: BIOPSY RECTAL;  Surgeon: Marlyce Huge, MD;  Location: [facility] ORS;  Service: General;  Laterality: N/A;  . RECTAL EXAM UNDER  ANESTHESIA N/A 11/02/2014   Procedure: RECTAL EXAM UNDER ANESTHESIA;  Surgeon: Marlyce Huge, MD;  Location: [facility] ORS;  Service: General;  Laterality: N/A;  . TONSILLECTOMY      SOCIAL HISTORY:  Social History   Tobacco Use  . Smoking status: Current Every Day Smoker    Packs/day: 0.50    Years: 20.00    Pack years: 10.00    Types: Cigarettes  . Smokeless tobacco: Never Used  . Tobacco comment: 1 PACK  EVERY 2 DAYS  Substance Use Topics  . Alcohol use: Not Currently    FAMILY HISTORY:  Family History  Problem Relation Age of Onset  . Asthma Mother   . Colon cancer Neg Hx     DRUG ALLERGIES: No Known Allergies  REVIEW OF SYSTEMS:   CONSTITUTIONAL: No fever, fatigue or weakness.  EYES: No changes in vision.  EARS, NOSE, AND THROAT: No tinnitus or ear pain.  RESPIRATORY: No cough, shortness of breath, wheezing or hemoptysis.  CARDIOVASCULAR: No chest pain, orthopnea, edema.  GASTROINTESTINAL: No nausea, vomiting, diarrhea or abdominal pain.  GENITOURINARY: No dysuria, hematuria.  ENDOCRINE: No polyuria, nocturia. HEMATOLOGY: No bleeding. SKIN: No rash or lesion. MUSCULOSKELETAL: Positive for right shoulder pain.   NEUROLOGIC: No focal weakness.  PSYCHIATRY: No anxiety or depression.   MEDICATIONS AT HOME:  Prior to Admission medications   Medication Sig Start Date End Date Taking? Authorizing Provider  amLODipine (NORVASC) 10 MG tablet Take 10 mg by mouth every morning.     [provider]  atorvastatin (LIPITOR) 40 MG tablet Take 40 mg by mouth every morning.     [provider]  bismuth subsalicylate (PEPTO BISMOL) 262 MG/15ML suspension Take 30 mLs by mouth every 6 (six) hours as needed for indigestion or diarrhea or loose stools.     [provider]  chlorthalidone (HYGROTON) 25 MG tablet Take 25 mg by mouth 2 (two) times daily.     [provider]  ferrous sulfate 325 (65 FE) MG tablet Take 325 mg by mouth daily.     [provider]  hydrALAZINE (APRESOLINE) 25 MG tablet Take 25 mg 2 (two) times daily by mouth.    [provider]  HYDROcodone-acetaminophen (NORCO/VICODIN) 5-325 MG tablet Take 1 tablet by mouth every 4 (four) hours as needed for moderate pain. 12/16/17 12/16/18  Robert Bellow, MD  omeprazole (PRILOSEC) 40 MG capsule Take 40 mg by mouth every morning.     [provider]  polyethylene glycol  (MIRALAX / GLYCOLAX) packet Take 17 g by mouth daily as needed for mild constipation.     [provider]  silver sulfADIAZINE (SILVADENE) 1 % cream Apply to affected area twice daily 01/14/18 01/14/19  Robert Bellow, MD  sulfamethoxazole-trimethoprim (BACTRIM DS,SEPTRA DS) 800-160 MG tablet Take 1 tablet by mouth 2 (two) times daily. 01/14/18   Robert Bellow, MD  vitamin B-12 (CYANOCOBALAMIN) 1000 MCG tablet Take 1 tablet (1,000 mcg total) by mouth daily. 06/03/17   Sindy Guadeloupe, MD  Vitamin D, Ergocalciferol, (DRISDOL) 50000 units CAPS capsule Take 50,000 Units by mouth every Tuesday.     [provider]      PHYSICAL EXAMINATION:   VITAL SIGNS: Blood pressure (!) 167/94, pulse 70, temperature 97.7 F (36.5 C), temperature source Oral, resp. rate 16, height 6' (1.829 m), weight 81.2 kg, SpO2 98 %.  GENERAL:  67 y.o.-year-old patient lying in the bed with no acute distress.  EYES: Pupils equal, round, reactive  to light and accommodation. No scleral icterus. Extraocular muscles intact.  HEENT: Head atraumatic, normocephalic. Oropharynx and nasopharynx clear.  NECK:  Supple, no jugular venous distention. No thyroid enlargement, no tenderness.  LUNGS: Reduced breath sounds bilaterally, no wheezing, rales,rhonchi or crepitation. No use of accessory muscles of respiration.  CARDIOVASCULAR: S1, S2 normal. No S3/S4.  ABDOMEN: Soft, nontender, nondistended. Bowel sounds present. No organomegaly or mass.  EXTREMITIES: There is severe tenderness at the right shoulder, deltoid aspect with palpation and with range of motion at the right shoulder joint.  Right upper extremity abduction is limited to 30 degrees, due to severe tenderness and reduced range of motion.  There is no signs of trauma, no swelling at the right shoulder and right upper extremity. NEUROLOGIC: Cranial nerves II through XII are intact. Muscle strength 5/5 in all extremities. Sensation intact.   PSYCHIATRIC:  The patient is alert and oriented x 3.  SKIN: No obvious rash, lesion, or ulcer.   LABORATORY PANEL:   CBC Recent Labs  Lab 01/21/18 1403 01/21/18 1616  WBC 7.0 8.4  HGB 8.7* 9.4*  HCT 27.2* 28.9*  PLT 200 227  MCV 84.7 86.3  MCH 27.1 28.1  MCHC 32.0 32.5  RDW 14.4 14.5  LYMPHSABS 1.1 1.1  MONOABS 0.8 0.8  EOSABS 0.2 0.2  BASOSABS 0.0 0.1   ------------------------------------------------------------------------------------------------------------------  Chemistries  Recent Labs  Lab 01/21/18 1616  NA 130*  K 5.1  CL 102  CO2 19*  GLUCOSE 168*  BUN 58*  CREATININE 3.37*  CALCIUM 8.5*  AST 46*  ALT 36  ALKPHOS 92  BILITOT 0.7   ------------------------------------------------------------------------------------------------------------------ estimated creatinine clearance is 24 mL/min (A) (by C-G formula based on SCr of 3.37 mg/dL (H)). ------------------------------------------------------------------------------------------------------------------ No results for input(s): TSH, T4TOTAL, T3FREE, THYROIDAB in the last 72 hours.  Invalid input(s): [name]3   Coagulation profile No results for input(s): INR, PROTIME in the last 168 hours. ------------------------------------------------------------------------------------------------------------------- No results for input(s): DDIMER in the last 72 hours. -------------------------------------------------------------------------------------------------------------------  Cardiac Enzymes Recent Labs  Lab 01/21/18 1616  TROPONINI <0.03   ------------------------------------------------------------------------------------------------------------------ Invalid input(s): POCBNP  ---------------------------------------------------------------------------------------------------------------  Urinalysis    Component Value Date/Time   COLORURINE STRAW (A) 08/27/2016 1405   APPEARANCEUR CLEAR (A) 08/27/2016 1405    APPEARANCEUR Clear 09/08/2013 0158   LABSPEC 1.009 08/27/2016 1405   LABSPEC 1.027 09/08/2013 0158   PHURINE 5.0 08/27/2016 1405   GLUCOSEU 50 (A) 08/27/2016 1405   GLUCOSEU >=500 09/08/2013 0158   HGBUR SMALL (A) 08/27/2016 1405   BILIRUBINUR NEGATIVE 08/27/2016 1405   BILIRUBINUR Negative 09/08/2013 0158   KETONESUR NEGATIVE 08/27/2016 1405   PROTEINUR 100 (A) 08/27/2016 1405   UROBILINOGEN 2.0 (H) 07/03/2011 1333   NITRITE NEGATIVE 08/27/2016 1405   LEUKOCYTESUR NEGATIVE 08/27/2016 1405   LEUKOCYTESUR 2+ 09/08/2013 0158     RADIOLOGY: Dg Chest 2 View  Result Date: 01/21/2018 CLINICAL DATA:  Sudden onset of right shoulder pain beginning 01/18/2018. History of bowel carcinoma. EXAM: CHEST - 2 VIEW COMPARISON:  11/16/2017 multiple previous studies as distant as 2016. CT 11/01/2014. FINDINGS: Heart size is normal. Mediastinal shadows are normal. The lungs are clear. No infiltrate, collapse or effusion. Question disc space narrowing in the upper thoracic region seen on the lateral view. I think this is a chronic finding when compared to previous radiographs and CT of 2016. IMPRESSION: No active disease. Probably chronic spinal degenerative changes in the thoracic region. Electronically Signed   By: Nelson Chimes M.D.   On: 01/21/2018 16:49  Dg Shoulder Right  Result Date: 01/21/2018 CLINICAL DATA:  Sudden onset of right shoulder pain beginning 10 12. EXAM: RIGHT SHOULDER - 2+ VIEW COMPARISON:  Chest radiography same day FINDINGS: Narrowed humeral acromial distance which can be associated with rotator cuff disease either acute or chronic. No evidence of fracture or dislocation. Regional ribs appear normal. IMPRESSION: Narrowed humeral acromial distance which can be associated with rotator cuff disease. Electronically Signed   By: Nelson Chimes M.D.   On: 01/21/2018 16:51    EKG: Orders placed or performed during the hospital encounter of 01/21/18  . ED EKG  . ED EKG    IMPRESSION  AND PLAN:  1. 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  Start gentle IV hydration.  Patient was counseled on discontinuing NSAIDs use.  Continue to monitor kidney function closely.  Avoid nephrotoxic medications. 2.  Right shoulder rotator cuff tendinitis.  Continue pain control with steroids and morphine, as needed.  Orthopedics is consulted for further evaluation and treatment. 3.  Hypertension, stable, resume home medications. 4.  Hyperlipidemia, on statin.  All the records are reviewed and case discussed with ED provider. Management plans discussed with the patient, family and they are in agreement.  CODE STATUS:FULL Code Status History    Date Active Date Inactive Code Status Order ID Comments User Context   11/17/2017 0318 11/18/2017 1338 Full Code [id number]  [name], MD Inpatient   01/24/2016 1433 01/26/2016 2143 Full Code [id number]  Epifanio Lesches, MD ED   09/13/2015 0748 09/14/2015 1611 Full Code [id number]  Hower, Aaron Mose, MD ED   11/11/2014 1729 11/12/2014 2325 Full Code [id number]  Henreitta Leber, MD Inpatient   11/01/2014 1701 11/04/2014 2027 Full Code [id number]  Marlyce Huge, MD ED       TOTAL TIME TAKING CARE OF THIS PATIENT: 50 minutes.    Amelia Jo M.D on 01/21/2018 at 9:52 PM  Between 7am to 6pm - Pager - [phone]  After 6pm go to [email or web address] - password [email or web address] [facility]  at [facility]  [phone]  CC: Primary care physician; [name], MD

## 2018-01-21 NOTE — ED Notes (Signed)
  Called pharmacy and Corene Cornea will send up medication

## 2018-01-21 NOTE — ED Triage Notes (Signed)
Pt presents to ED via POV with c/o sudden onset R shoulder pain that started 10/12. Pt states attempted relief with pain medication at home and ice packs with some relief. Pt states went to cancer center today and was referred to ED due to him having "high blood". Pt with hx of HTN at this time.

## 2018-01-21 NOTE — Telephone Encounter (Signed)
Patient walked into office requesting that his lab be checked stating he feels his blood has dropped. Per Dr Janese Banks, cbcD, Hold tube

## 2018-01-21 NOTE — ED Provider Notes (Signed)
[facility] Emergency Department Provider Note   ____________________________________________   First MD Initiated Contact with Patient 01/21/18 1952     (approximate)  I have reviewed the triage vital signs and the nursing notes.   HISTORY  Chief Complaint Shoulder Pain and Hypertension    HPI [name] is a 67 y.o. male patient reports pain in the right shoulder for the last 2 to 3 days.  Apparently came on fairly suddenly.  He does not remember anything that he did or did not do to make it happen.  Hurts worse with movement.  Also hurts worse on palpation of the [facility] joint and over the bicipital groove.  Patient has no weakness in the arm normal distal pulses.  Patient reports to palpation in the area as I mentioned exactly reproduces his pain.   Past Medical History:  Diagnosis Date  . Anemia   . 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 CHF (congestive heart failure) ([place])   . Chronic kidney disease   . COPD (chronic obstructive pulmonary disease) ([place])    NO INHALERS  . Diabetes mellitus    NO MEDS  . GERD (gastroesophageal reflux disease)   . Hepatic cirrhosis ([place])   . Hypertension   . Leg swelling   . Loose, teeth    PATIENT STATES "HAS 6 TEETH, SOME ARE LOOSE"  . Neuromuscular disorder ([place])    RIGHT HAND AND FINGERS NUMB  . Shortness of breath dyspnea    WITH EXERTION   . 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 Mass of perianal area 10/16/2017  . Mass of pharynx 11/09/2016  . Polyp of cecum 11/09/2016  . Chronic hepatitis C without hepatic coma ([place]) 11/09/2016  . Cirrhosis ([place]) 11/09/2016  . Iron deficiency anemia 09/19/2016  . Chest pain with high risk for cardiac etiology 08/13/2016  . SOB (shortness of breath) on exertion 08/13/2016  . Varicose veins of both lower extremities with  inflammation 06/11/2016  . Essential hypertension 06/11/2016  . Chronic venous insufficiency 05/08/2016  . Diabetic foot ulcer with osteomyelitis ([place]) 05/08/2016  . Lymphedema 04/12/2016  . Ulcer of great toe ([place]) 04/12/2016  . 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 Anemia 01/26/2016  . Skin mass   . Lymphadenopathy, inguinal   . Symptomatic anemia 01/24/2016  . Hypoglycemia 09/13/2015  . Internal hemorrhoids 12/30/2014  . Mass of anus 11/01/2014    Past Surgical History:  Procedure Laterality Date  . APPENDECTOMY    . COLONOSCOPY WITH PROPOFOL N/A 11/19/2014   Procedure: COLONOSCOPY WITH PROPOFOL;  Surgeon: Lucilla Lame, MD;  Location: [place];  Service: Endoscopy;  Laterality: N/A;  DIABETIC-ORAL MEDS  . COLONOSCOPY WITH PROPOFOL N/A 10/04/2016   Procedure: COLONOSCOPY WITH PROPOFOL;  Surgeon: Jonathon Bellows, MD;  Location: [facility] ENDOSCOPY;  Service: Endoscopy;  Laterality: N/A;  . ESOPHAGOGASTRODUODENOSCOPY (EGD) WITH PROPOFOL N/A 10/04/2016   Procedure: ESOPHAGOGASTRODUODENOSCOPY (EGD) WITH PROPOFOL;  Surgeon: Jonathon Bellows, MD;  Location: [facility] ENDOSCOPY;  Service: Endoscopy;  Laterality: N/A;  . GIVENS CAPSULE STUDY N/A 11/13/2016   Procedure: GIVENS CAPSULE STUDY;  Surgeon: Jonathon Bellows, MD;  Location: [facility] ENDOSCOPY;  Service: Gastroenterology;  Laterality: N/A;  . INCISION AND DRAINAGE PERIRECTAL ABSCESS Right 12/16/2017   Procedure: EXCISION BASAL CELL CANCER/ GLUTEAL ABSCESS AND FLAP COVERAGE;  Surgeon: Robert Bellow,  MD;  Location: [facility] ORS;  Service: General;  Laterality: Right;  . INGUINAL LYMPH NODE BIOPSY Right 01/26/2016   Procedure: INGUINAL LYMPH NODE BIOPSY;  Surgeon: Clayburn Pert, MD;  Location: [facility] ORS;  Service: General;  Laterality: Right;  . RECTAL BIOPSY N/A 11/02/2014   Procedure: BIOPSY RECTAL;  Surgeon: Marlyce Huge, MD;  Location: [facility] ORS;  Service: General;  Laterality: N/A;  . RECTAL EXAM UNDER ANESTHESIA N/A 11/02/2014    Procedure: RECTAL EXAM UNDER ANESTHESIA;  Surgeon: Marlyce Huge, MD;  Location: [facility] ORS;  Service: General;  Laterality: N/A;  . TONSILLECTOMY      Prior to Admission medications   Medication Sig Start Date End Date Taking? Authorizing Provider  amLODipine (NORVASC) 10 MG tablet Take 10 mg by mouth every morning.     [provider]  atorvastatin (LIPITOR) 40 MG tablet Take 40 mg by mouth every morning.     [provider]  bismuth subsalicylate (PEPTO BISMOL) 262 MG/15ML suspension Take 30 mLs by mouth every 6 (six) hours as needed for indigestion or diarrhea or loose stools.     [provider]  chlorthalidone (HYGROTON) 25 MG tablet Take 25 mg by mouth 2 (two) times daily.     [provider]  ferrous sulfate 325 (65 FE) MG tablet Take 325 mg by mouth daily.     [provider]  hydrALAZINE (APRESOLINE) 25 MG tablet Take 25 mg 2 (two) times daily by mouth.    [provider]  HYDROcodone-acetaminophen (NORCO/VICODIN) 5-325 MG tablet Take 1 tablet by mouth every 4 (four) hours as needed for moderate pain. 12/16/17 12/16/18  Robert Bellow, MD  omeprazole (PRILOSEC) 40 MG capsule Take 40 mg by mouth every morning.     [provider]  polyethylene glycol (MIRALAX / GLYCOLAX) packet Take 17 g by mouth daily as needed for mild constipation.     [provider]  silver sulfADIAZINE (SILVADENE) 1 % cream Apply to affected area twice daily 01/14/18 01/14/19  Robert Bellow, MD  sulfamethoxazole-trimethoprim (BACTRIM DS,SEPTRA DS) 800-160 MG tablet Take 1 tablet by mouth 2 (two) times daily. 01/14/18   Robert Bellow, MD  vitamin B-12 (CYANOCOBALAMIN) 1000 MCG tablet Take 1 tablet (1,000 mcg total) by mouth daily. 06/03/17   Sindy Guadeloupe, MD  Vitamin D, Ergocalciferol, (DRISDOL) 50000 units CAPS capsule Take 50,000 Units by mouth every Tuesday.     [provider]    Allergies Patient has no known  allergies.  Family History  Problem Relation Age of Onset  . Asthma Mother   . Colon cancer Neg Hx     Social History Social History   Tobacco Use  . Smoking status: Current Every Day Smoker    Packs/day: 0.50    Years: 20.00    Pack years: 10.00    Types: Cigarettes  . Smokeless tobacco: Never Used  . Tobacco comment: 1 PACK EVERY 2 DAYS  Substance Use Topics  . Alcohol use: Not Currently  . Drug use: Yes    Types: Marijuana    Comment: Last Use 2016 per patient-+ UDS FOR COCAINE IN 2014    Review of Systems  Constitutional: No fever/chills Eyes: No visual changes. ENT: No sore throat. Cardiovascular: Denies chest pain. Respiratory: Denies shortness of breath. Gastrointestinal: No abdominal pain.  No nausea, no vomiting.  No diarrhea.  No constipation. Genitourinary: Negative for dysuria. Musculoskeletal: Negative for back pain. Skin: Negative for rash. Neurological: Negative for headaches, focal  weakness    ____________________________________________   PHYSICAL EXAM:  VITAL SIGNS: ED Triage Vitals  Enc Vitals Group     BP 01/21/18 1612 (!) 170/83     Pulse Rate 01/21/18 1612 69     Resp 01/21/18 1612 20     Temp 01/21/18 1612 97.7 F (36.5 C)     Temp Source 01/21/18 1612 Oral     SpO2 01/21/18 1612 99 %     Weight 01/21/18 1613 179 lb (81.2 kg)     Height 01/21/18 1613 6' (1.829 m)     Head Circumference --      Peak Flow --      Pain Score 01/21/18 1613 8     Pain Loc --      Pain Edu? --      Excl. in [place]? --     Constitutional: Alert and oriented. Well appearing and in no acute distress. Eyes: Conjunctivae are normal.  Head: Atraumatic. Nose: No congestion/rhinnorhea. Mouth/Throat: Mucous membranes are moist.  Oropharynx non-erythematous. Neck: No stridor.  Cardiovascular: Normal rate, regular rhythm. Grossly normal heart sounds.  Good peripheral circulation. Respiratory: Normal respiratory effort.  No retractions. Lungs  CTAB. Gastrointestinal: Soft and nontender. No distention. No abdominal bruits. No CVA tenderness. Musculoskeletal: No lower extremity tenderness nor edema.  Full range of motion of the right shoulder although slightly limited by pain normal distal pulses and capillary refill tender over the [facility] joint and bicipital groove on the right side this exactly reproduces his pain. Neurologic:  Normal speech and language. No gross focal neurologic deficits are appreciated. . Skin:  Skin is warm, dry and intact. No rash noted. Psychiatric: Mood and affect are normal. Speech and behavior are normal.  ____________________________________________   LABS (all labs ordered are listed, but only abnormal results are displayed)  Labs Reviewed  CBC WITH DIFFERENTIAL/PLATELET - Abnormal; Notable for the following components:      Result Value   RBC 3.35 (*)    Hemoglobin 9.4 (*)    HCT 28.9 (*)    All other components within normal limits  COMPREHENSIVE METABOLIC PANEL - Abnormal; Notable for the following components:   Sodium 130 (*)    CO2 19 (*)    Glucose, Bld 168 (*)    BUN 58 (*)    Creatinine, Ser 3.37 (*)    Calcium 8.5 (*)    Total Protein 8.4 (*)    Albumin 3.2 (*)    AST 46 (*)    GFR calc non Af Amer 18 (*)    GFR calc Af Amer 21 (*)    All other components within normal limits  TROPONIN I   ____________________________________________  EKG  KG read and interpreted by me shows normal sinus rhythm at 72 left axis left anterior hemiblock high peak T waves diffusely this is unchanged from his previous EKG ______________________________  RADIOLOGY  ED MD interpretation: X-ray of the chest and right shoulder read by radiology reviewed by me shows some narrowed humeral acromial distance.  There is no acute pathology or masses  Official radiology report(s): Dg Chest 2 View  Result Date: 01/21/2018 CLINICAL DATA:  Sudden onset of right shoulder pain beginning 01/18/2018. History of  bowel carcinoma. EXAM: CHEST - 2 VIEW COMPARISON:  11/16/2017 multiple previous studies as distant as 2016. CT 11/01/2014. FINDINGS: Heart size is normal. Mediastinal shadows are normal. The lungs are clear. No infiltrate, collapse or effusion. Question disc space narrowing in the upper thoracic region seen on  the lateral view. I think this is a chronic finding when compared to previous radiographs and CT of 2016. IMPRESSION: No active disease. Probably chronic spinal degenerative changes in the thoracic region. Electronically Signed   By: Nelson Chimes M.D.   On: 01/21/2018 16:49   Dg Shoulder Right  Result Date: 01/21/2018 CLINICAL DATA:  Sudden onset of right shoulder pain beginning 10 12. EXAM: RIGHT SHOULDER - 2+ VIEW COMPARISON:  Chest radiography same day FINDINGS: Narrowed humeral acromial distance which can be associated with rotator cuff disease either acute or chronic. No evidence of fracture or dislocation. Regional ribs appear normal. IMPRESSION: Narrowed humeral acromial distance which can be associated with rotator cuff disease. Electronically Signed   By: Nelson Chimes M.D.   On: 01/21/2018 16:51    ____________________________________________   PROCEDURES  Procedure(s) performed:   Procedures  Critical Care performed:   ____________________________________________   INITIAL IMPRESSION / ASSESSMENT AND PLAN / ED COURSE Patient with acute kidney injury with as an incidental finding on labs.  His creatinine is gone from 2-3.37 in a month.  I am not sure what medicine he was taking for his shoulder pain.  We will get him in the kidney to check on his shoulder pain and acute kidney injury.  I will start him on his antihypertensive medicines which she is missed the evening dose of both see if this helps his blood pressure.          ____________________________________________   FINAL CLINICAL IMPRESSION(S) / ED DIAGNOSES  Final diagnoses:  AKI (acute kidney injury)  ([place])  Hypertension, unspecified type  Acute pain of right shoulder     ED Discharge Orders    None       Note:  This document was prepared using Dragon voice recognition software and may include unintentional dictation errors.    Nena Polio, MD 01/21/18 2005

## 2018-01-21 NOTE — Progress Notes (Unsigned)
Patient had lab work done today. His Hgb came back low and he was to receive a retacrit injection. I took patient's blood pressure several times all of which were high. I contacted Dr. Elroy Channel nurse and informed her of this and she contacted Dr. Janese Banks. Dr. Janese Banks stated to recheck BP one more time and then give injection if BP was not much higher. I rechecked patient's BP and it came back as 184/85. At this time patient was complaining of a sharp pain in his right arm that ran all the way to his shoulder blade and back. Dr. Janese Banks was informed of the higher blood pressure and the the patient's complaint. Dr. Janese Banks advised patient to go to the ER and patient was agreeable. Patient left [place] with his cousin headed to the ER.

## 2018-01-22 ENCOUNTER — Inpatient Hospital Stay: Payer: Medicare HMO

## 2018-01-22 LAB — CBC
HEMATOCRIT: 24.8 % — AB (ref 39.0–52.0)
HEMOGLOBIN: 7.9 g/dL — AB (ref 13.0–17.0)
MCH: 27.6 pg (ref 26.0–34.0)
MCHC: 31.9 g/dL (ref 30.0–36.0)
MCV: 86.7 fL (ref 80.0–100.0)
Platelets: 166 10*3/uL (ref 150–400)
RBC: 2.86 MIL/uL — AB (ref 4.22–5.81)
RDW: 14.6 % (ref 11.5–15.5)
WBC: 5.8 10*3/uL (ref 4.0–10.5)
nRBC: 0 % (ref 0.0–0.2)

## 2018-01-22 LAB — BASIC METABOLIC PANEL
ANION GAP: 5 (ref 5–15)
BUN: 54 mg/dL — AB (ref 8–23)
CHLORIDE: 106 mmol/L (ref 98–111)
CO2: 21 mmol/L — ABNORMAL LOW (ref 22–32)
Calcium: 7.9 mg/dL — ABNORMAL LOW (ref 8.9–10.3)
Creatinine, Ser: 3.39 mg/dL — ABNORMAL HIGH (ref 0.61–1.24)
GFR calc Af Amer: 20 mL/min — ABNORMAL LOW (ref >60)
GFR, EST NON AFRICAN AMERICAN: 18 mL/min — AB (ref >60)
GLUCOSE: 117 mg/dL — AB (ref 70–99)
POTASSIUM: 5 mmol/L (ref 3.5–5.1)
SODIUM: 132 mmol/L — AB (ref 135–145)

## 2018-01-22 LAB — GLUCOSE, CAPILLARY: GLUCOSE-CAPILLARY: 119 mg/dL — AB (ref 70–99)

## 2018-01-22 MED ORDER — CLINDAMYCIN HCL 150 MG PO CAPS
300.0000 mg | ORAL_CAPSULE | Freq: Three times a day (TID) | ORAL | Status: DC
  Administered 2018-01-22 – 2018-01-23 (×2): 300 mg via ORAL
  Filled 2018-01-22 (×4): qty 2

## 2018-01-22 MED ORDER — SODIUM BICARBONATE 650 MG PO TABS
650.0000 mg | ORAL_TABLET | Freq: Three times a day (TID) | ORAL | Status: DC
  Administered 2018-01-22 – 2018-01-24 (×7): 650 mg via ORAL
  Filled 2018-01-22 (×9): qty 1

## 2018-01-22 MED ORDER — NICOTINE 21 MG/24HR TD PT24
21.0000 mg | MEDICATED_PATCH | Freq: Every day | TRANSDERMAL | Status: DC
  Administered 2018-01-23 – 2018-01-24 (×2): 21 mg via TRANSDERMAL
  Filled 2018-01-22 (×2): qty 1

## 2018-01-22 MED ORDER — METHYLPREDNISOLONE SODIUM SUCC 125 MG IJ SOLR
125.0000 mg | Freq: Once | INTRAMUSCULAR | Status: AC
  Administered 2018-01-22: 125 mg via INTRAVENOUS
  Filled 2018-01-22: qty 2

## 2018-01-22 NOTE — Consult Note (Signed)
[place] Nurse wound consult note Patient evaluated in [facility] 115.  No family present. Reason for Consult: Right buttock wound Wound type: Surgical site patient states was a cancer that Dr. Rana Snare removed. Measurement: Approximately 6 cm x 3 cm Wound bed: 100% light pink Drainage (amount, consistency, odor) Moderate amount of serous on existing dressing. Periwound: Intact, normal color and texture. Dressing procedure/placement/frequency: Cleanse right buttock wound with soap and water.  Pat dry.  Apply a small foam dressing. Change daily and prn. Monitor the wound area(s) for worsening of condition such as: Signs/symptoms of infection,  Increase in size,  Development of or worsening of odor, Development of pain, or increased pain at the affected locations.  Notify the medical team if any of these develop.  Thank you for the consult.  Discussed plan of care with the patient and bedside nurse.  [place] nurse will not follow at this time.  Please re-consult the [place] team if needed.  Val Riles, RN, MSN, CWOCN, CNS-BC, pager [phone]

## 2018-01-22 NOTE — Progress Notes (Signed)
[place] [facility] [place] NAME: Marvie Calender    MR#:  [id number]  DATE OF BIRTH:  1950-12-08  SUBJECTIVE:  CHIEF COMPLAINT:   Chief Complaint  Patient presents with  . Shoulder Pain  . Hypertension  Patient seen and evaluated by me today Decreased pain in the right shoulder Has some discharge in the buttock area from the wound Is an active smoker No complaints of any chest pain, shortness of breath  REVIEW OF SYSTEMS:    ROS  CONSTITUTIONAL: No documented fever. No fatigue, weakness. No weight gain, no weight loss.  EYES: No blurry or double vision.  ENT: No tinnitus. No postnasal drip. No redness of the oropharynx.  RESPIRATORY: No cough, no wheeze, no hemoptysis. No dyspnea.  CARDIOVASCULAR: No chest pain. No orthopnea. No palpitations. No syncope.  GASTROINTESTINAL: No nausea, no vomiting or diarrhea. No abdominal pain. No melena or hematochezia.  GENITOURINARY: No dysuria or hematuria.  ENDOCRINE: No polyuria or nocturia. No heat or cold intolerance.  HEMATOLOGY: No anemia. No bruising. No bleeding.  INTEGUMENTARY: Status post incisional biopsy for perianal mass Bandage noted MUSCULOSKELETAL: No arthritis. No swelling. No gout.  NEUROLOGIC: No numbness, tingling, or ataxia. No seizure-type activity.  PSYCHIATRIC: No anxiety. No insomnia. No ADD.   DRUG ALLERGIES:  No Known Allergies  VITALS:  Blood pressure (!) 162/86, pulse 74, temperature 98.5 F (36.9 C), temperature source Oral, resp. rate 20, height 6' (1.829 m), weight 82.9 kg, SpO2 98 %.  PHYSICAL EXAMINATION:   Physical Exam  GENERAL:  67 y.o.-year-old patient lying in the bed with no acute distress.  EYES: Pupils equal, round, reactive to light and accommodation. No scleral icterus. Extraocular muscles intact.  HEENT: Head atraumatic, normocephalic. Oropharynx and nasopharynx clear.  NECK:  Supple, no jugular venous distention. No thyroid enlargement, no tenderness.   LUNGS: Normal breath sounds bilaterally, no wheezing, rales, rhonchi. No use of accessory muscles of respiration.  CARDIOVASCULAR: S1, S2 normal. No murmurs, rubs, or gallops.  ABDOMEN: Soft, nontender, nondistended. Bowel sounds present. No organomegaly or mass.  Buttock area Drainage noted from the biopsy site at the perianal site Bandage changed EXTREMITIES: No cyanosis, clubbing or edema b/l.    NEUROLOGIC: Cranial nerves II through XII are intact. No focal Motor or sensory deficits b/l.   PSYCHIATRIC: The patient is alert and oriented x 3.  SKIN: Very and alleviate discharge from the biopsy site  LABORATORY PANEL:   CBC Recent Labs  Lab 01/22/18 0456  WBC 5.8  HGB 7.9*  HCT 24.8*  PLT 166   ------------------------------------------------------------------------------------------------------------------ Chemistries  Recent Labs  Lab 01/21/18 1616 01/22/18 0456  NA 130* 132*  K 5.1 5.0  CL 102 106  CO2 19* 21*  GLUCOSE 168* 117*  BUN 58* 54*  CREATININE 3.37* 3.39*  CALCIUM 8.5* 7.9*  AST 46*  --   ALT 36  --   ALKPHOS 92  --   BILITOT 0.7  --    ------------------------------------------------------------------------------------------------------------------  Cardiac Enzymes Recent Labs  Lab 01/21/18 1616  TROPONINI <0.03   ------------------------------------------------------------------------------------------------------------------  RADIOLOGY:  Dg Chest 2 View  Result Date: 01/21/2018 CLINICAL DATA:  Sudden onset of right shoulder pain beginning 01/18/2018. History of bowel carcinoma. EXAM: CHEST - 2 VIEW COMPARISON:  11/16/2017 multiple previous studies as distant as 2016. CT 11/01/2014. FINDINGS: Heart size is normal. Mediastinal shadows are normal. The lungs are clear. No infiltrate, collapse or effusion. Question disc space narrowing in the upper thoracic region  seen on the lateral view. I think this is a chronic finding when compared to previous  radiographs and CT of 2016. IMPRESSION: No active disease. Probably chronic spinal degenerative changes in the thoracic region. Electronically Signed   By: Nelson Chimes M.D.   On: 01/21/2018 16:49   Dg Shoulder Right  Result Date: 01/21/2018 CLINICAL DATA:  Sudden onset of right shoulder pain beginning 10 12. EXAM: RIGHT SHOULDER - 2+ VIEW COMPARISON:  Chest radiography same day FINDINGS: Narrowed humeral acromial distance which can be associated with rotator cuff disease either acute or chronic. No evidence of fracture or dislocation. Regional ribs appear normal. IMPRESSION: Narrowed humeral acromial distance which can be associated with rotator cuff disease. Electronically Signed   By: Nelson Chimes M.D.   On: 01/21/2018 16:51   US Renal  Result Date: 01/22/2018 CLINICAL DATA:  Acute renal failure EXAM: RENAL / URINARY TRACT ULTRASOUND COMPLETE COMPARISON:  None. FINDINGS: Right Kidney: Length: 11.1 cm. Echogenicity within normal limits. No mass or hydronephrosis visualized. Left Kidney: Length: 11.4 cm. Echogenicity within normal limits. No mass or hydronephrosis visualized. Bladder: Appears normal for degree of bladder distention. Soft tissue: 2 small hyperechoic masses in the spleen with the largest measuring 9 x 8 x 8 mm likely reflecting small hemangiomas. IMPRESSION: 1. Normal bilateral kidneys.  No obstructive uropathy. Electronically Signed   By: Kathreen Devoid   On: 01/22/2018 12:56     ASSESSMENT AND PLAN:   67 year old male patient with history of perianal mass status post biopsy which showed invasive carcinoma with basaloid features, chronic congestive heart failure, chronic kidney disease, COPD, diabetes mellitus type 2, active smoker, cirrhosis of liver, hypertension currently under hospitalist service for right shoulder pain and worsening renal function  -Acute right rotator cuff tendinitis Improving Patient has good range of motion of the shoulder with decreased pain Received  steroids Continue pain management  -Acute on chronic kidney disease stage III Monitor renal function Appreciate nephrology follow-up  -IgG lambda MGUS Follow-up with oncology as outpatient  -Cirrhosis of liver Supportive care  -Perianal invasive carcinoma with basaloid features Status post biopsy oncology follow-up as outpatient  -Tobacco abuse Tobacco cessation counseled to the patient for 6 minutes Nicotine patch offered  -DVT prophylaxis with subcu heparin  All the records are reviewed and case discussed with Care Management/Social Worker. Management plans discussed with the patient, family and they are in agreement.  CODE STATUS: Full code  DVT Prophylaxis: SCDs  TOTAL TIME TAKING CARE OF THIS PATIENT: 33 minutes.   POSSIBLE D/C IN 2 to 3 DAYS, DEPENDING ON CLINICAL CONDITION.  Saundra Shelling M.D on 01/22/2018 at 7:27 PM  Between 7am to 6pm - Pager - [phone]  After 6pm go to [email or web address] - password [email or web address] [place] [facility]  Office  [phone]  CC: Primary care physician; [name], MD  Note: This dictation was prepared with Dragon dictation along with smaller phrase technology. Any transcriptional errors that result from this process are unintentional.

## 2018-01-22 NOTE — Progress Notes (Signed)
Called Dr Jerelyn Charles as patient said he has no been seen by primary MD today, per Dr Jerelyn Charles he may come by, but he thought he was admitted the same day were they would not necessarily see patient, told him patient was admitted to unit prior to midnight on the 15th

## 2018-01-22 NOTE — Progress Notes (Signed)
[facility], [place] 01/22/18  Subjective:   Patient known to our practice from outpatient follow-up.  He was lost to follow-up earlier in the year.  Last known creatinine from March 30 is 2.19/GFR 35 Admission creatinine 3.37.  Today's creatinine is 3.39 which is about the same.  GFR 20 Renal ultrasound is unremarkable Patient is getting IV fluids for hydration He originally presented to the emergency room for right shoulder pain  Objective:  Vital signs in last 24 hours:  Temp:  [97.4 F (36.3 C)-98.2 F (36.8 C)] 98.2 F (36.8 C) (10/16 0938) Pulse Rate:  [52-75] 72 (10/16 0938) Resp:  [11-24] 16 (10/16 0938) BP: (152-184)/(74-100) 152/74 (10/16 0938) SpO2:  [96 %-100 %] 96 % (10/16 0938) Weight:  [81.2 kg-82.9 kg] 82.9 kg (10/15 2305)  Weight change:  Filed Weights   01/21/18 1613 01/21/18 2305  Weight: 81.2 kg 82.9 kg    Intake/Output:    Intake/Output Summary (Last 24 hours) at 01/22/2018 1417 Last data filed at 01/22/2018 0900 Gross per 24 hour  Intake 2510.3 ml  Output 1000 ml  Net 1510.3 ml     Physical Exam: General:  Laying in the bed, no acute distress  HEENT  moist oral mucous membranes  Neck  supple  Pulm/lungs  normal breathing effort, clear to auscultation  CVS/Heart  regular, no rub  Abdomen:   Soft, mildly distended  Extremities:  No peripheral edema  Neurologic:  Alert, able to answer questions  Skin:  No acute rashes          Basic Metabolic Panel:  Recent Labs  Lab 01/21/18 1616 01/22/18 0456  NA 130* 132*  K 5.1 5.0  CL 102 106  CO2 19* 21*  GLUCOSE 168* 117*  BUN 58* 54*  CREATININE 3.37* 3.39*  CALCIUM 8.5* 7.9*     CBC: Recent Labs  Lab 01/21/18 1403 01/21/18 1616 01/22/18 0456  WBC 7.0 8.4 5.8  NEUTROABS 4.9 6.2  --   HGB 8.7* 9.4* 7.9*  HCT 27.2* 28.9* 24.8*  MCV 84.7 86.3 86.7  PLT 200 227 166      Lab Results  Component Value Date   HEPBSAG Negative 08/27/2016   HEPBIGM  Negative 08/27/2016      Microbiology:  No results found for this or any previous visit (from the past 240 hour(s)).  Coagulation Studies: No results for input(s): LABPROT, INR in the last 72 hours.  Urinalysis: No results for input(s): COLORURINE, LABSPEC, PHURINE, GLUCOSEU, HGBUR, BILIRUBINUR, KETONESUR, PROTEINUR, UROBILINOGEN, NITRITE, LEUKOCYTESUR in the last 72 hours.  Invalid input(s): APPERANCEUR    Imaging: Dg Chest 2 View  Result Date: 01/21/2018 CLINICAL DATA:  Sudden onset of right shoulder pain beginning 01/18/2018. History of bowel carcinoma. EXAM: CHEST - 2 VIEW COMPARISON:  11/16/2017 multiple previous studies as distant as 2016. CT 11/01/2014. FINDINGS: Heart size is normal. Mediastinal shadows are normal. The lungs are clear. No infiltrate, collapse or effusion. Question disc space narrowing in the upper thoracic region seen on the lateral view. I think this is a chronic finding when compared to previous radiographs and CT of 2016. IMPRESSION: No active disease. Probably chronic spinal degenerative changes in the thoracic region. Electronically Signed   By: Nelson Chimes M.D.   On: 01/21/2018 16:49   Dg Shoulder Right  Result Date: 01/21/2018 CLINICAL DATA:  Sudden onset of right shoulder pain beginning 10 12. EXAM: RIGHT SHOULDER - 2+ VIEW COMPARISON:  Chest radiography same day FINDINGS: Narrowed humeral acromial  distance which can be associated with rotator cuff disease either acute or chronic. No evidence of fracture or dislocation. Regional ribs appear normal. IMPRESSION: Narrowed humeral acromial distance which can be associated with rotator cuff disease. Electronically Signed   By: Nelson Chimes M.D.   On: 01/21/2018 16:51   US Renal  Result Date: 01/22/2018 CLINICAL DATA:  Acute renal failure EXAM: RENAL / URINARY TRACT ULTRASOUND COMPLETE COMPARISON:  None. FINDINGS: Right Kidney: Length: 11.1 cm. Echogenicity within normal limits. No mass or hydronephrosis  visualized. Left Kidney: Length: 11.4 cm. Echogenicity within normal limits. No mass or hydronephrosis visualized. Bladder: Appears normal for degree of bladder distention. Soft tissue: 2 small hyperechoic masses in the spleen with the largest measuring 9 x 8 x 8 mm likely reflecting small hemangiomas. IMPRESSION: 1. Normal bilateral kidneys.  No obstructive uropathy. Electronically Signed   By: Kathreen Devoid   On: 01/22/2018 12:56     Medications:   . sodium chloride 75 mL/hr at 01/22/18 1314   . amLODipine  10 mg Oral BH-q7a  . atorvastatin  40 mg Oral BH-q7a  . chlorthalidone  25 mg Oral BID  . docusate sodium  100 mg Oral BID  . ferrous sulfate  325 mg Oral Daily  . heparin  5,000 Units Subcutaneous Q8H  . hydrALAZINE  25 mg Oral BID  . pantoprazole  40 mg Oral Daily  . sodium bicarbonate  650 mg Oral TID  . vitamin B-12  1,000 mcg Oral Daily  . [START ON 01/28/2018] Vitamin D (Ergocalciferol)  50,000 Units Oral Q Tue   acetaminophen **OR** acetaminophen, bisacodyl, HYDROcodone-acetaminophen, ondansetron **OR** ondansetron (ZOFRAN) IV, traZODone  Assessment/ Plan:  67 y.o. 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  Acute kidney injury versus progression of underlying chronic kidney disease 2.  Chronic kidney disease stage III.  Baseline creatinine 2.19/GFR 35 from July 06, 2017 3.  IgG lambda MGUS.  Followed by Dr. Janese Banks at [facility] 4.  Hepatic cirrhosis  It is unclear at present whether patient has acute kidney injury versus progression of underlying chronic kidney disease.  His renal ultrasound is unremarkable.  No recent urinalysis available We will order a  urinalysis.  Patient is currently getting IV hydration.  We will see how he responds Avoid nephrotoxins, IV contrast, nonsteroidals, phosphorus enemas   LOS: [place] 10/16/20192:17 PM  [place], [place]  Note: This note was prepared with Dragon dictation. Any transcription errors are unintentional

## 2018-01-22 NOTE — Progress Notes (Signed)
Advanced care plan.  Purpose of the Encounter: CODE STATUS  Parties in Attendance: Patient  Patient's Decision Capacity: Good  Subjective/Patient's story: Presented to the emergency room for renal insufficiency and shoulder pain   Objective/Medical story Patient has rotator cuff tendinitis Needs pain management He also needs IV fluids for worsening renal function Monitor creatinine level Tobacco cessation counseled to the patient   Goals of care determination:  Advance care directives goals of care discussed Patient wants everything done which includes CPR, intubation ventilator if the need arises   CODE STATUS: Full code   Time spent discussing advanced care planning: 16 minutes

## 2018-01-23 ENCOUNTER — Ambulatory Visit: Payer: Medicare HMO | Admitting: Physician Assistant

## 2018-01-23 LAB — BASIC METABOLIC PANEL
ANION GAP: 7 (ref 5–15)
BUN: 62 mg/dL — ABNORMAL HIGH (ref 8–23)
CALCIUM: 8 mg/dL — AB (ref 8.9–10.3)
CO2: 17 mmol/L — AB (ref 22–32)
Chloride: 107 mmol/L (ref 98–111)
Creatinine, Ser: 3.72 mg/dL — ABNORMAL HIGH (ref 0.61–1.24)
GFR calc Af Amer: 18 mL/min — ABNORMAL LOW (ref >60)
GFR calc non Af Amer: 16 mL/min — ABNORMAL LOW (ref >60)
GLUCOSE: 231 mg/dL — AB (ref 70–99)
POTASSIUM: 5.5 mmol/L — AB (ref 3.5–5.1)
Sodium: 131 mmol/L — ABNORMAL LOW (ref 135–145)

## 2018-01-23 LAB — URINALYSIS, COMPLETE (UACMP) WITH MICROSCOPIC
BILIRUBIN URINE: NEGATIVE
Glucose, UA: 50 mg/dL — AB
Hgb urine dipstick: NEGATIVE
KETONES UR: NEGATIVE mg/dL
LEUKOCYTES UA: NEGATIVE
NITRITE: NEGATIVE
PROTEIN: 100 mg/dL — AB
Specific Gravity, Urine: 1.013 (ref 1.005–1.030)
Squamous Epithelial / LPF: NONE SEEN (ref 0–5)
pH: 5 (ref 5.0–8.0)

## 2018-01-23 LAB — GLUCOSE, CAPILLARY
GLUCOSE-CAPILLARY: 183 mg/dL — AB (ref 70–99)
Glucose-Capillary: 178 mg/dL — ABNORMAL HIGH (ref 70–99)

## 2018-01-23 MED ORDER — PATIROMER SORBITEX CALCIUM 8.4 G PO PACK
16.8000 g | PACK | Freq: Every day | ORAL | Status: DC
  Administered 2018-01-23 – 2018-01-24 (×2): 16.8 g via ORAL
  Filled 2018-01-23 (×2): qty 2

## 2018-01-23 MED ORDER — DICLOFENAC SODIUM 1 % TD GEL
2.0000 g | Freq: Four times a day (QID) | TRANSDERMAL | Status: DC
  Administered 2018-01-23 – 2018-01-24 (×4): 2 g via TOPICAL
  Filled 2018-01-23: qty 100

## 2018-01-23 MED ORDER — CLINDAMYCIN HCL 150 MG PO CAPS
300.0000 mg | ORAL_CAPSULE | Freq: Three times a day (TID) | ORAL | Status: DC
  Administered 2018-01-23 – 2018-01-24 (×3): 300 mg via ORAL
  Filled 2018-01-23 (×4): qty 2

## 2018-01-23 MED ORDER — TRAMADOL HCL 50 MG PO TABS: 50.0000 mg | ORAL_TABLET | Freq: Two times a day (BID) | ORAL | Status: DC | PRN

## 2018-01-23 NOTE — Progress Notes (Signed)
Agree with assessment charted by Chasity, ACC NS. Madlyn Frankel, RN

## 2018-01-23 NOTE — Progress Notes (Signed)
[place] [facility] [place] NAME: [name]    MR#:  [id number]  DATE OF BIRTH:  11/29/1950  SUBJECTIVE:  CHIEF COMPLAINT:   Chief Complaint  Patient presents with  . Shoulder Pain  . Hypertension  Patient seen and evaluated by me today Decreased pain in the right shoulder Has some discharge in the buttock area from the wound Is an active smoker No complaints of any chest pain, shortness of breath Have right shoulder pain.  REVIEW OF SYSTEMS:    ROS  CONSTITUTIONAL: No documented fever. No fatigue, weakness. No weight gain, no weight loss.  EYES: No blurry or double vision.  ENT: No tinnitus. No postnasal drip. No redness of the oropharynx.  RESPIRATORY: No cough, no wheeze, no hemoptysis. No dyspnea.  CARDIOVASCULAR: No chest pain. No orthopnea. No palpitations. No syncope.  GASTROINTESTINAL: No nausea, no vomiting or diarrhea. No abdominal pain. No melena or hematochezia.  GENITOURINARY: No dysuria or hematuria.  ENDOCRINE: No polyuria or nocturia. No heat or cold intolerance.  HEMATOLOGY: No anemia. No bruising. No bleeding.  INTEGUMENTARY: Status post incisional biopsy for perianal mass Bandage noted MUSCULOSKELETAL: No arthritis. No swelling. No gout.  NEUROLOGIC: No numbness, tingling, or ataxia. No seizure-type activity.  PSYCHIATRIC: No anxiety. No insomnia. No ADD.   DRUG ALLERGIES:  No Known Allergies  VITALS:  Blood pressure (!) 164/86, pulse 76, temperature 97.9 F (36.6 C), temperature source Oral, resp. rate 16, height 6' (1.829 m), weight 82.9 kg, SpO2 98 %.  PHYSICAL EXAMINATION:   Physical Exam  GENERAL:  67 y.o.-year-old patient lying in the bed with no acute distress.  EYES: Pupils equal, round, reactive to light and accommodation. No scleral icterus. Extraocular muscles intact.  HEENT: Head atraumatic, normocephalic. Oropharynx and nasopharynx clear.  NECK:  Supple, no jugular venous distention. No thyroid  enlargement, no tenderness.  LUNGS: Normal breath sounds bilaterally, no wheezing, rales, rhonchi. No use of accessory muscles of respiration.  CARDIOVASCULAR: S1, S2 normal. No murmurs, rubs, or gallops.  ABDOMEN: Soft, nontender, nondistended. Bowel sounds present. No organomegaly or mass.  Buttock area Drainage noted from the biopsy site at the perianal site Bandage changed EXTREMITIES: No cyanosis, clubbing or edema b/l.    NEUROLOGIC: Cranial nerves II through XII are intact. No focal Motor or sensory deficits b/l.   PSYCHIATRIC: The patient is alert and oriented x 3.  SKIN: Very and alleviate discharge from the biopsy site  LABORATORY PANEL:   CBC Recent Labs  Lab 01/22/18 0456  WBC 5.8  HGB 7.9*  HCT 24.8*  PLT 166   ------------------------------------------------------------------------------------------------------------------ Chemistries  Recent Labs  Lab 01/21/18 1616  01/23/18 0337  NA 130*   < > 131*  K 5.1   < > 5.5*  CL 102   < > 107  CO2 19*   < > 17*  GLUCOSE 168*   < > 231*  BUN 58*   < > 62*  CREATININE 3.37*   < > 3.72*  CALCIUM 8.5*   < > 8.0*  AST 46*  --   --   ALT 36  --   --   ALKPHOS 92  --   --   BILITOT 0.7  --   --    < > = values in this interval not displayed.   ------------------------------------------------------------------------------------------------------------------  Cardiac Enzymes Recent Labs  Lab 01/21/18 1616  TROPONINI <0.03   ------------------------------------------------------------------------------------------------------------------  RADIOLOGY:  US Renal  Result Date: 01/22/2018 CLINICAL  DATA:  Acute renal failure EXAM: RENAL / URINARY TRACT ULTRASOUND COMPLETE COMPARISON:  None. FINDINGS: Right Kidney: Length: 11.1 cm. Echogenicity within normal limits. No mass or hydronephrosis visualized. Left Kidney: Length: 11.4 cm. Echogenicity within normal limits. No mass or hydronephrosis visualized. Bladder:  Appears normal for degree of bladder distention. Soft tissue: 2 small hyperechoic masses in the spleen with the largest measuring 9 x 8 x 8 mm likely reflecting small hemangiomas. IMPRESSION: 1. Normal bilateral kidneys.  No obstructive uropathy. Electronically Signed   By: Kathreen Devoid   On: 01/22/2018 12:56     ASSESSMENT AND PLAN:   67 year old male patient with history of perianal mass status post biopsy which showed invasive carcinoma with basaloid features, chronic congestive heart failure, chronic kidney disease, COPD, diabetes mellitus type 2, active smoker, cirrhosis of liver, hypertension currently under hospitalist service for right shoulder pain and worsening renal function  - Acute right rotator cuff tendinitis Improving Patient has good range of motion of the shoulder with decreased pain Received steroids Continue pain management  seen by ortho- follow in clinic.  -Acute on chronic kidney disease stage III Monitor renal function Appreciate nephrology follow-up  Cont IV fluids.  -IgG lambda MGUS Follow-up with oncology as outpatient  -Cirrhosis of liver Supportive care  -Perianal invasive carcinoma with basaloid features Status post biopsy oncology follow-up as outpatient  - Infection at the biopsy site on perianal area  Clindamycin  -Tobacco abuse Tobacco cessation counseled to the patient for 6 minutes Nicotine patch offered  -DVT prophylaxis with subcu heparin  All the records are reviewed and case discussed with Care Management/Social Worker. Management plans discussed with the patient, family and they are in agreement.  CODE STATUS: Full code  DVT Prophylaxis: SCDs  TOTAL TIME TAKING CARE OF THIS PATIENT: 33 minutes.   POSSIBLE D/C IN 2 to 3 DAYS, DEPENDING ON CLINICAL CONDITION.  Vaughan Basta M.D on 01/23/2018 at 8:17 PM  Between 7am to 6pm - Pager - [phone]  After 6pm go to [email or web address] - password [email or web address]  [facility]  Office  [phone]  CC: Primary care physician; [name], MD  Note: This dictation was prepared with Dragon dictation along with smaller phrase technology. Any transcriptional errors that result from this process are unintentional.

## 2018-01-23 NOTE — Consult Note (Signed)
ORTHOPAEDIC CONSULTATION  REQUESTING PHYSICIAN: Vaughan Basta, *  Chief Complaint: Right shoulder pain  HPI: [name] is a 67 y.o. male who complains of right shoulder pain for the past 2 weeks or so.  He has not any recent injury.  He does recall injuring the shoulder several years ago however.  He recently was admitted to the hospital for renal failure.  He also is being treated for  perianal cancer.  He is not taking any NSAIDs.  The pain is in the shoulder girdle and radiates down the arm toward the elbow.  Is not any fever or chills associated with this. Past Medical History:  Diagnosis Date  . Anemia   . Cancer ([place]) 11/02/2014   PERIANAL MASS, RIGHT; INCISIONAL BIOPSY: INVASIVE CARCINOMA WITH BASALOID FEATURES  . CHF (congestive heart failure) ([place])   . Chronic kidney disease   . COPD (chronic obstructive pulmonary disease) ([place])    NO INHALERS  . Diabetes mellitus    NO MEDS  . GERD (gastroesophageal reflux disease)   . Hepatic cirrhosis ([place])   . Hypertension   . Leg swelling   . Loose, teeth    PATIENT STATES "HAS 6 TEETH, SOME ARE LOOSE"  . Neuromuscular disorder ([place])    RIGHT HAND AND FINGERS NUMB  . Shortness of breath dyspnea    WITH EXERTION   . 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 APPENDECTOMY    . COLONOSCOPY WITH PROPOFOL N/A 11/19/2014   Procedure: COLONOSCOPY WITH PROPOFOL;  Surgeon: Lucilla Lame, MD;  Location: [place];  Service: Endoscopy;  Laterality: N/A;  DIABETIC-ORAL MEDS  . COLONOSCOPY WITH PROPOFOL N/A 10/04/2016   Procedure: COLONOSCOPY WITH PROPOFOL;  Surgeon: Jonathon Bellows, MD;  Location: [facility] ENDOSCOPY;  Service: Endoscopy;  Laterality: N/A;  . ESOPHAGOGASTRODUODENOSCOPY (EGD) WITH PROPOFOL N/A 10/04/2016   Procedure: ESOPHAGOGASTRODUODENOSCOPY (EGD) WITH PROPOFOL;  Surgeon: Jonathon Bellows, MD;  Location: [facility] ENDOSCOPY;  Service: Endoscopy;  Laterality:  N/A;  . GIVENS CAPSULE STUDY N/A 11/13/2016   Procedure: GIVENS CAPSULE STUDY;  Surgeon: Jonathon Bellows, MD;  Location: [facility] ENDOSCOPY;  Service: Gastroenterology;  Laterality: N/A;  . INCISION AND DRAINAGE PERIRECTAL ABSCESS Right 12/16/2017   Procedure: EXCISION BASAL CELL CANCER/ GLUTEAL ABSCESS AND FLAP COVERAGE;  Surgeon: Robert Bellow, MD;  Location: [facility] ORS;  Service: General;  Laterality: Right;  . INGUINAL LYMPH NODE BIOPSY Right 01/26/2016   Procedure: INGUINAL LYMPH NODE BIOPSY;  Surgeon: Clayburn Pert, MD;  Location: [facility] ORS;  Service: General;  Laterality: Right;  . RECTAL BIOPSY N/A 11/02/2014   Procedure: BIOPSY RECTAL;  Surgeon: Marlyce Huge, MD;  Location: [facility] ORS;  Service: General;  Laterality: N/A;  . RECTAL EXAM UNDER ANESTHESIA N/A 11/02/2014   Procedure: RECTAL EXAM UNDER ANESTHESIA;  Surgeon: Marlyce Huge, MD;  Location: [facility] ORS;  Service: General;  Laterality: N/A;  . TONSILLECTOMY     Social History   Socioeconomic History  . Marital status: Divorced    Spouse name: Not on file  . Number of children: Not on file  . Years of education: Not on file  . Highest education level: Not on file  Occupational History  . Not on file  Social Needs  . Financial resource strain: Somewhat hard  . Food insecurity:    Worry: Patient refused    Inability: Patient refused  . Transportation needs:    Medical: Patient refused    Non-medical: Patient refused  Tobacco Use  . Smoking status: Current Every Day Smoker    Packs/day: 0.50    Years: 20.00    Pack years: 10.00    Types: Cigarettes  . Smokeless tobacco: Never Used  . Tobacco comment: 1 PACK EVERY 2 DAYS  Substance and Sexual Activity  . Alcohol use: Not Currently  . Drug use: Yes    Types: Marijuana    Comment: Last Use 2016 per patient-+ UDS FOR COCAINE IN 2014  . Sexual activity: Never  Lifestyle  . Physical activity:    Days per week: 0 days    Minutes per session: 0 min  . Stress:  To some extent  Relationships  . Social connections:    Talks on phone: More than three times a week    Gets together: More than three times a week    Attends religious service: More than 4 times per year    Active member of club or organization: Yes    Attends meetings of clubs or organizations: More than 4 times per year    Relationship status: Divorced  Other Topics Concern  . Not on file  Social History Narrative  . Not on file   Family History  Problem Relation Age of Onset  . Asthma Mother   . Colon cancer Neg Hx    No Known Allergies Prior to Admission medications   Medication Sig Start Date End Date Taking? Authorizing Provider  amLODipine (NORVASC) 10 MG tablet Take 10 mg by mouth every morning.    Yes [provider]  atorvastatin (LIPITOR) 40 MG tablet Take 40 mg by mouth every morning.    Yes [provider]  chlorthalidone (HYGROTON) 25 MG tablet Take 25 mg by mouth 2 (two) times daily.    Yes [provider]  ferrous sulfate 325 (65 FE) MG tablet Take 325 mg by mouth daily.    Yes [provider]  hydrALAZINE (APRESOLINE) 25 MG tablet Take 25 mg 2 (two) times daily by mouth.   Yes [provider]  polyethylene glycol (MIRALAX / GLYCOLAX) packet Take 17 g by mouth daily as needed for mild constipation.    Yes [provider]  silver sulfADIAZINE (SILVADENE) 1 % cream Apply to affected area twice daily 01/14/18 01/14/19 Yes Byrnett, Forest Gleason, MD  sulfamethoxazole-trimethoprim (BACTRIM DS,SEPTRA DS) 800-160 MG tablet Take 1 tablet by mouth 2 (two) times daily. 01/14/18  Yes Byrnett, Forest Gleason, MD  vitamin B-12 (CYANOCOBALAMIN) 1000 MCG tablet Take 1 tablet (1,000 mcg total) by mouth daily. 06/03/17  Yes Sindy Guadeloupe, MD  Vitamin D, Ergocalciferol, (DRISDOL) 50000 units CAPS capsule Take 50,000 Units by mouth every Tuesday.    Yes [provider]  bismuth subsalicylate (PEPTO BISMOL) 262 MG/15ML suspension Take 30  mLs by mouth every 6 (six) hours as needed for indigestion or diarrhea or loose stools.     [provider]  HYDROcodone-acetaminophen (NORCO/VICODIN) 5-325 MG tablet Take 1 tablet by mouth every 4 (four) hours as needed for moderate pain. Patient not taking: Reported on 01/21/2018 12/16/17 12/16/18  Robert Bellow, MD  omeprazole (PRILOSEC) 40 MG capsule Take 40 mg by mouth every morning.     [provider]   US Renal  Result Date: 01/22/2018 CLINICAL DATA:  Acute renal failure EXAM: RENAL / URINARY TRACT ULTRASOUND COMPLETE COMPARISON:  None. FINDINGS: Right Kidney: Length: 11.1 cm. Echogenicity within normal limits. No mass or hydronephrosis visualized. Left Kidney: Length: 11.4 cm. Echogenicity within normal  limits. No mass or hydronephrosis visualized. Bladder: Appears normal for degree of bladder distention. Soft tissue: 2 small hyperechoic masses in the spleen with the largest measuring 9 x 8 x 8 mm likely reflecting small hemangiomas. IMPRESSION: 1. Normal bilateral kidneys.  No obstructive uropathy. Electronically Signed   By: Kathreen Devoid   On: 01/22/2018 12:56    Positive ROS: All other systems have been reviewed and were otherwise negative with the exception of those mentioned in the HPI and as above.  Physical Exam: General: Alert, no acute distress Cardiovascular: No pedal edema Respiratory: No cyanosis, no use of accessory musculature GI: No organomegaly, abdomen is soft and non-tender Skin: No lesions in the area of chief complaint Neurologic: Sensation intact distally Psychiatric: Patient is competent for consent with normal mood and affect Lymphatic: No axillary or cervical lymphadenopathy  MUSCULOSKELETAL: Patient alert and cooperative.  He was napping but awakens easily.  Head and neck are unremarkable with no significant pain.  The left shoulder is full motion without pain.  The right shoulder shows abduction to 85 degrees with external rotation of about  60 degrees 60 degrees internal rotation of 50 degrees.  There is some crepitus.  There is minimal weakness.  Biceps and triceps strength is good.  Neurovascular status good distally.   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 Return to my clinic after hospital discharge for possible injection.    Park Breed, MD [phone]   01/23/2018 5:20 PM

## 2018-01-23 NOTE — Progress Notes (Signed)
[facility], [place] 01/23/18  Subjective:   Patient known to our practice from outpatient follow-up.  He was lost to follow-up earlier in the year.  Last known creatinine from March 30 is 2.19/GFR 35 Admission creatinine 3.37.  Today's creatinine is 3.72 which is slightly worse. Renal ultrasound is unremarkable Patient is getting IV fluids for hydration Still c/o rt shoulder pain No Nausea or vomiting  Objective:  Vital signs in last 24 hours:  Temp:  [97.8 F (36.6 C)-98.5 F (36.9 C)] 97.8 F (36.6 C) (10/17 0800) Pulse Rate:  [74-88] 75 (10/17 0800) Resp:  [17-20] 17 (10/17 0800) BP: (162-180)/(81-90) 173/81 (10/17 0800) SpO2:  [97 %-99 %] 97 % (10/17 0800)  Weight change:  Filed Weights   01/21/18 1613 01/21/18 2305  Weight: 81.2 kg 82.9 kg    Intake/Output:    Intake/Output Summary (Last 24 hours) at 01/23/2018 1052 Last data filed at 01/23/2018 0900 Gross per 24 hour  Intake 240 ml  Output 450 ml  Net -210 ml     Physical Exam: General:  Laying in the bed, no acute distress  HEENT  moist oral mucous membranes  Neck  supple  Pulm/lungs  normal breathing effort, clear to auscultation  CVS/Heart  regular, no rub  Abdomen:   Soft, mildly distended  Extremities:  No peripheral edema, good ROM in rt shoulder  Neurologic:  Alert, able to answer questions  Skin:  No acute rashes          Basic Metabolic Panel:  Recent Labs  Lab 01/21/18 1616 01/22/18 0456 01/23/18 0337  NA 130* 132* 131*  K 5.1 5.0 5.5*  CL 102 106 107  CO2 19* 21* 17*  GLUCOSE 168* 117* 231*  BUN 58* 54* 62*  CREATININE 3.37* 3.39* 3.72*  CALCIUM 8.5* 7.9* 8.0*     CBC: Recent Labs  Lab 01/21/18 1403 01/21/18 1616 01/22/18 0456  WBC 7.0 8.4 5.8  NEUTROABS 4.9 6.2  --   HGB 8.7* 9.4* 7.9*  HCT 27.2* 28.9* 24.8*  MCV 84.7 86.3 86.7  PLT 200 227 166      Lab Results  Component Value Date   HEPBSAG Negative 08/27/2016   HEPBIGM Negative  08/27/2016      Microbiology:  No results found for this or any previous visit (from the past 240 hour(s)).  Coagulation Studies: No results for input(s): LABPROT, INR in the last 72 hours.  Urinalysis: No results for input(s): COLORURINE, LABSPEC, PHURINE, GLUCOSEU, HGBUR, BILIRUBINUR, KETONESUR, PROTEINUR, UROBILINOGEN, NITRITE, LEUKOCYTESUR in the last 72 hours.  Invalid input(s): APPERANCEUR    Imaging: Dg Chest 2 View  Result Date: 01/21/2018 CLINICAL DATA:  Sudden onset of right shoulder pain beginning 01/18/2018. History of bowel carcinoma. EXAM: CHEST - 2 VIEW COMPARISON:  11/16/2017 multiple previous studies as distant as 2016. CT 11/01/2014. FINDINGS: Heart size is normal. Mediastinal shadows are normal. The lungs are clear. No infiltrate, collapse or effusion. Question disc space narrowing in the upper thoracic region seen on the lateral view. I think this is a chronic finding when compared to previous radiographs and CT of 2016. IMPRESSION: No active disease. Probably chronic spinal degenerative changes in the thoracic region. Electronically Signed   By: Nelson Chimes M.D.   On: 01/21/2018 16:49   Dg Shoulder Right  Result Date: 01/21/2018 CLINICAL DATA:  Sudden onset of right shoulder pain beginning 10 12. EXAM: RIGHT SHOULDER - 2+ VIEW COMPARISON:  Chest radiography same day FINDINGS: Narrowed humeral acromial  distance which can be associated with rotator cuff disease either acute or chronic. No evidence of fracture or dislocation. Regional ribs appear normal. IMPRESSION: Narrowed humeral acromial distance which can be associated with rotator cuff disease. Electronically Signed   By: Nelson Chimes M.D.   On: 01/21/2018 16:51   US Renal  Result Date: 01/22/2018 CLINICAL DATA:  Acute renal failure EXAM: RENAL / URINARY TRACT ULTRASOUND COMPLETE COMPARISON:  None. FINDINGS: Right Kidney: Length: 11.1 cm. Echogenicity within normal limits. No mass or hydronephrosis visualized.  Left Kidney: Length: 11.4 cm. Echogenicity within normal limits. No mass or hydronephrosis visualized. Bladder: Appears normal for degree of bladder distention. Soft tissue: 2 small hyperechoic masses in the spleen with the largest measuring 9 x 8 x 8 mm likely reflecting small hemangiomas. IMPRESSION: 1. Normal bilateral kidneys.  No obstructive uropathy. Electronically Signed   By: Kathreen Devoid   On: 01/22/2018 12:56     Medications:   . sodium chloride 75 mL/hr at 01/22/18 1314   . amLODipine  10 mg Oral BH-q7a  . atorvastatin  40 mg Oral BH-q7a  . chlorthalidone  25 mg Oral BID  . clindamycin  300 mg Oral Q8H  . diclofenac sodium  2 g Topical QID  . docusate sodium  100 mg Oral BID  . ferrous sulfate  325 mg Oral Daily  . heparin  5,000 Units Subcutaneous Q8H  . hydrALAZINE  25 mg Oral BID  . nicotine  21 mg Transdermal Daily  . pantoprazole  40 mg Oral Daily  . sodium bicarbonate  650 mg Oral TID  . vitamin B-12  1,000 mcg Oral Daily  . [START ON 01/28/2018] Vitamin D (Ergocalciferol)  50,000 Units Oral Q Tue   acetaminophen **OR** acetaminophen, bisacodyl, HYDROcodone-acetaminophen, ondansetron **OR** ondansetron (ZOFRAN) IV, traZODone  Assessment/ Plan:  67 y.o. 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  Acute kidney injury versus progression of underlying chronic kidney disease 2.  Chronic kidney disease stage III.  Baseline creatinine 2.19/GFR 35 from July 06, 2017 3.  IgG lambda MGUS.  Followed by Dr[facility] Janese Banks at [facility] 4.  Hepatic cirrhosis 5. Hyperkalemia  It is unclear at present whether patient has acute kidney injury versus progression of underlying  chronic kidney disease.  His renal ultrasound is unremarkable.  No recent urinalysis available We will order a urinalysis.  Patient is currently getting IV hydration.  We will see how he responds Avoid nephrotoxins, IV contrast, nonsteroidals, phosphorus enemas - Low k Diet - topical cream for rt shoulder pain - one dose of veltassa   LOS: 2 [name] [name] 10/17/201910:52 AM  [facility] [place], [place]  Note: This note was prepared with Dragon dictation. Any transcription errors are unintentional

## 2018-01-24 LAB — BASIC METABOLIC PANEL
ANION GAP: 6 (ref 5–15)
BUN: 68 mg/dL — ABNORMAL HIGH (ref 8–23)
CHLORIDE: 110 mmol/L (ref 98–111)
CO2: 19 mmol/L — ABNORMAL LOW (ref 22–32)
Calcium: 8.3 mg/dL — ABNORMAL LOW (ref 8.9–10.3)
Creatinine, Ser: 3.28 mg/dL — ABNORMAL HIGH (ref 0.61–1.24)
GFR calc Af Amer: 21 mL/min — ABNORMAL LOW (ref >60)
GFR, EST NON AFRICAN AMERICAN: 18 mL/min — AB (ref >60)
Glucose, Bld: 134 mg/dL — ABNORMAL HIGH (ref 70–99)
POTASSIUM: 4.9 mmol/L (ref 3.5–5.1)
SODIUM: 135 mmol/L (ref 135–145)

## 2018-01-24 LAB — CBC
HEMATOCRIT: 24.7 % — AB (ref 39.0–52.0)
HEMOGLOBIN: 7.7 g/dL — AB (ref 13.0–17.0)
MCH: 27.7 pg (ref 26.0–34.0)
MCHC: 31.2 g/dL (ref 30.0–36.0)
MCV: 88.8 fL (ref 80.0–100.0)
Platelets: 176 10*3/uL (ref 150–400)
RBC: 2.78 MIL/uL — AB (ref 4.22–5.81)
RDW: 14.6 % (ref 11.5–15.5)
WBC: 8.2 10*3/uL (ref 4.0–10.5)
nRBC: 0 % (ref 0.0–0.2)

## 2018-01-24 LAB — GLUCOSE, CAPILLARY: GLUCOSE-CAPILLARY: 121 mg/dL — AB (ref 70–99)

## 2018-01-24 MED ORDER — NICOTINE 21 MG/24HR TD PT24: 21.0000 mg | MEDICATED_PATCH | Freq: Every day | TRANSDERMAL | 0 refills | Status: DC

## 2018-01-24 MED ORDER — POLYETHYLENE GLYCOL 3350 17 G PO PACK
17.0000 g | PACK | Freq: Every day | ORAL | Status: DC
  Administered 2018-01-24: 13:00:00 17 g via ORAL
  Filled 2018-01-24: qty 1

## 2018-01-24 MED ORDER — PATIROMER SORBITEX CALCIUM 8.4 G PO PACK: 16.8000 g | PACK | Freq: Every day | ORAL | 0 refills | Status: DC

## 2018-01-24 MED ORDER — SODIUM BICARBONATE 650 MG PO TABS: 650.0000 mg | ORAL_TABLET | Freq: Three times a day (TID) | ORAL | 0 refills | Status: AC

## 2018-01-24 MED ORDER — SENNOSIDES-DOCUSATE SODIUM 8.6-50 MG PO TABS
1.0000 | ORAL_TABLET | Freq: Two times a day (BID) | ORAL | Status: DC
  Administered 2018-01-24: 1 via ORAL
  Filled 2018-01-24: qty 1

## 2018-01-24 MED ORDER — DOCUSATE SODIUM 100 MG PO CAPS: 100.0000 mg | ORAL_CAPSULE | Freq: Two times a day (BID) | ORAL | 0 refills | Status: DC

## 2018-01-24 MED ORDER — CLINDAMYCIN HCL 300 MG PO CAPS: 300.0000 mg | ORAL_CAPSULE | Freq: Three times a day (TID) | ORAL | 0 refills | Status: AC

## 2018-01-24 NOTE — Care Management Important Message (Signed)
Copy of signed IM left with patient in room.  

## 2018-01-24 NOTE — Progress Notes (Signed)
[facility], [place] 01/24/18  Subjective:   Patient known to our practice from outpatient follow-up.  He was lost to follow-up earlier in the year.  Last known creatinine from March 30 is 2.19/GFR 35 Admission creatinine 3.37.  Today's creatinine is 3.28 which is slightly worse. Renal ultrasound is unremarkable Patient is getting IV fluids for hydration  rt shoulder pain evaluated by orthopedics.  Chronic rotator cuff disease.  Conservative management. No Nausea or vomiting  Objective:  Vital signs in last 24 hours:  Temp:  [97.6 F (36.4 C)-98.3 F (36.8 C)] 97.6 F (36.4 C) (10/18 0737) Pulse Rate:  [70-76] 70 (10/18 0737) Resp:  [16] 16 (10/18 0737) BP: (158-171)/(81-99) 158/99 (10/18 0737) SpO2:  [97 %-98 %] 97 % (10/18 0737)  Weight change:  Filed Weights   01/21/18 1613 01/21/18 2305  Weight: 81.2 kg 82.9 kg    Intake/Output:    Intake/Output Summary (Last 24 hours) at 01/24/2018 1516 Last data filed at 01/24/2018 0746 Gross per 24 hour  Intake 2710.69 ml  Output 1200 ml  Net 1510.69 ml     Physical Exam: General:  Laying in the bed, no acute distress  HEENT  moist oral mucous membranes  Neck  supple  Pulm/lungs  normal breathing effort, clear to auscultation  CVS/Heart  regular, no rub  Abdomen:   Soft, mildly distended  Extremities:  No peripheral edema, good ROM in rt shoulder  Neurologic:  Alert, able to answer questions  Skin:  No acute rashes          Basic Metabolic Panel:  Recent Labs  Lab 01/21/18 1616 01/22/18 0456 01/23/18 0337 01/24/18 0425  NA 130* 132* 131* 135  K 5.1 5.0 5.5* 4.9  CL 102 106 107 110  CO2 19* 21* 17* 19*  GLUCOSE 168* 117* 231* 134*  BUN 58* 54* 62* 68*  CREATININE 3.37* 3.39* 3.72* 3.28*  CALCIUM 8.5* 7.9* 8.0* 8.3*     CBC: Recent Labs  Lab 01/21/18 1403 01/21/18 1616 01/22/18 0456 01/24/18 0425  WBC 7.0 8.4 5.8 8.2  NEUTROABS 4.9 6.2  --   --   HGB 8.7* 9.4* 7.9*  7.7*  HCT 27.2* 28.9* 24.8* 24.7*  MCV 84.7 86.3 86.7 88.8  PLT 200 227 166 176      Lab Results  Component Value Date   HEPBSAG Negative 08/27/2016   HEPBIGM Negative 08/27/2016      Microbiology:  No results found for this or any previous visit (from the past 240 hour(s)).  Coagulation Studies: No results for input(s): LABPROT, INR in the last 72 hours.  Urinalysis: Recent Labs    01/23/18 1051  COLORURINE YELLOW*  LABSPEC 1.013  PHURINE 5.0  GLUCOSEU 50*  HGBUR NEGATIVE  BILIRUBINUR NEGATIVE  KETONESUR NEGATIVE  PROTEINUR 100*  NITRITE NEGATIVE  LEUKOCYTESUR NEGATIVE      Imaging: No results found.   Medications:   . sodium chloride Stopped (01/24/18 1257)   . amLODipine  10 mg Oral BH-q7a  . atorvastatin  40 mg Oral BH-q7a  . chlorthalidone  25 mg Oral BID  . clindamycin  300 mg Oral Q8H  . diclofenac sodium  2 g Topical QID  . docusate sodium  100 mg Oral BID  . ferrous sulfate  325 mg Oral Daily  . heparin  5,000 Units Subcutaneous Q8H  . hydrALAZINE  25 mg Oral BID  . nicotine  21 mg Transdermal Daily  . pantoprazole  40 mg Oral Daily  .  patiromer  16.8 g Oral Daily  . polyethylene glycol  17 g Oral Daily  . senna-docusate  1 tablet Oral BID  . sodium bicarbonate  650 mg Oral TID  . vitamin B-12  1,000 mcg Oral Daily  . [START ON 01/28/2018] Vitamin D (Ergocalciferol)  50,000 Units Oral Q Tue   acetaminophen **OR** acetaminophen, bisacodyl, HYDROcodone-acetaminophen, ondansetron **OR** ondansetron (ZOFRAN) IV, traMADol, traZODone  Assessment/ Plan:  67 y.o. 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  Acute kidney injury versus  progression of underlying chronic kidney disease 2.  Chronic kidney disease stage III.  Baseline creatinine 2.19/GFR 35 from July 06, 2017.  Likely secondary to diabetes 3.  IgG lambda MGUS.  Followed by Dr. Janese Banks at [facility] cancer center 4.  Hepatic cirrhosis 5. Hyperkalemia  There has been no improvement in renal function with IV fluids.  It appears that there has been progression of underlying chronic kidney disease.  His renal ultrasound is unremarkable.  Urinalysis shows proteinuria and glucosuria Avoid nephrotoxins, IV contrast, oral nonsteroidals, phosphorus enemas We will arrange outpatient follow-up.   LOS: [place] 10/18/20193:16 PM  [place], [place]  Note: This note was prepared with Dragon dictation. Any transcription errors are unintentional

## 2018-01-24 NOTE — Progress Notes (Signed)
Received md order to discharge patient to home, reviews homes meds, prescriptions discharges instructions and follow appointments with patient and patient verbalized understanding

## 2018-01-27 ENCOUNTER — Telehealth: Payer: Self-pay

## 2018-01-27 NOTE — Telephone Encounter (Signed)
EMMI Follow-up: Noted on the report that the patient had stated no to scheduled follow-up appointments.  I talked with [name] and he said the MD offices had called him with appointments and he was aware of upcoming lab appointment.  Said he hd Rx's filled, feeling fine, and thanked me for calling. No needs noted.

## 2018-01-28 ENCOUNTER — Encounter: Payer: Self-pay | Admitting: General Surgery

## 2018-01-28 ENCOUNTER — Other Ambulatory Visit: Payer: Self-pay

## 2018-01-28 ENCOUNTER — Ambulatory Visit (INDEPENDENT_AMBULATORY_CARE_PROVIDER_SITE_OTHER): Payer: Medicare HMO | Admitting: General Surgery

## 2018-01-28 VITALS — BP 138/74 | HR 64 | Resp 15 | Ht 72.0 in | Wt 181.0 lb

## 2018-01-28 DIAGNOSIS — C44519 Basal cell carcinoma of skin of other part of trunk: Secondary | ICD-10-CM

## 2018-01-28 NOTE — Patient Instructions (Signed)
The patient is aware to call back for any questions or new concerns.  

## 2018-01-28 NOTE — Progress Notes (Signed)
Patient ID: [name], male   DOB: Sep 14, 1950, 67 y.o.   MRN: [id number]  Chief Complaint  Patient presents with  . Routine Post Op    HPI [name] is a 67 y.o. male.  Here for postoperative visit, excision basel cell gluteal mass on 12-16-17. He states he still has some drainage but minimal pain. He changes the dressing about 2-3 times a day.  He went to the ED for his shoulder last week ( Tuesday to Friday) and they changed the dressing for him. He states the pain mediation is affecting his kidneys, appointment is Friday for follow up, Dr Candiss Norse. He is here with Delorise Shiner, brother in law.  HPI  Past Medical History:  Diagnosis Date  . Anemia   . Cancer ([place]) 11/02/2014   PERIANAL MASS, RIGHT; INCISIONAL BIOPSY: INVASIVE CARCINOMA WITH BASALOID FEATURES  . CHF (congestive heart failure) ([place])   . Chronic kidney disease   . COPD (chronic obstructive pulmonary disease) ([place])    NO INHALERS  . Diabetes mellitus    NO MEDS  . GERD (gastroesophageal reflux disease)   . Hepatic cirrhosis ([place])   . Hypertension   . Leg swelling   . Loose, teeth    PATIENT STATES "HAS 6 TEETH, SOME ARE LOOSE"  . Neuromuscular disorder ([place])    RIGHT HAND AND FINGERS NUMB  . Shortness of breath dyspnea    WITH EXERTION   . 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 APPENDECTOMY    . COLONOSCOPY WITH PROPOFOL N/A 11/19/2014   Procedure: COLONOSCOPY WITH PROPOFOL;  Surgeon: Lucilla Lame, MD;  Location: [place];  Service: Endoscopy;  Laterality: N/A;  DIABETIC-ORAL MEDS  . COLONOSCOPY WITH PROPOFOL N/A 10/04/2016   Procedure: COLONOSCOPY WITH PROPOFOL;  Surgeon: Jonathon Bellows, MD;  Location: [facility] ENDOSCOPY;  Service: Endoscopy;  Laterality: N/A;  . ESOPHAGOGASTRODUODENOSCOPY (EGD) WITH PROPOFOL N/A 10/04/2016   Procedure: ESOPHAGOGASTRODUODENOSCOPY (EGD) WITH PROPOFOL;  Surgeon: Jonathon Bellows, MD;  Location: [facility] ENDOSCOPY;   Service: Endoscopy;  Laterality: N/A;  . GIVENS CAPSULE STUDY N/A 11/13/2016   Procedure: GIVENS CAPSULE STUDY;  Surgeon: Jonathon Bellows, MD;  Location: [name] ENDOSCOPY;  Service: Gastroenterology;  Laterality: N/A;  . INCISION AND DRAINAGE PERIRECTAL ABSCESS Right 12/16/2017   Procedure: EXCISION BASAL CELL CANCER/ GLUTEAL ABSCESS AND FLAP COVERAGE;  Surgeon: Robert Bellow, MD;  Location: [facility] ORS;  Service: General;  Laterality: Right;  . INGUINAL LYMPH NODE BIOPSY Right 01/26/2016   Procedure: INGUINAL LYMPH NODE BIOPSY;  Surgeon: Clayburn Pert, MD;  Location: [facility] ORS;  Service: General;  Laterality: Right;  . RECTAL BIOPSY N/A 11/02/2014   Procedure: BIOPSY RECTAL;  Surgeon: Marlyce Huge, MD;  Location: [facility] ORS;  Service: General;  Laterality: N/A;  . RECTAL EXAM UNDER ANESTHESIA N/A 11/02/2014   Procedure: RECTAL EXAM UNDER ANESTHESIA;  Surgeon: Marlyce Huge, MD;  Location: [facility] ORS;  Service: General;  Laterality: N/A;  . TONSILLECTOMY      Family History  Problem Relation Age of Onset  . Asthma Mother   . Colon cancer Neg Hx     Social History Social History   Tobacco Use  . Smoking status: Current Every Day Smoker    Packs/day: 0.50    Years: 20.00    Pack years: 10.00    Types: Cigarettes  . Smokeless tobacco: Never Used  . Tobacco comment: 1 PACK EVERY 2 DAYS  Substance Use Topics  .  Alcohol use: Not Currently  . Drug use: Yes    Types: Marijuana    Comment: Last Use 2016 per patient-+ UDS FOR COCAINE IN 2014    No Known Allergies  Current Outpatient Medications  Medication Sig Dispense Refill  . amLODipine (NORVASC) 10 MG tablet Take 10 mg by mouth every morning.     Marland Kitchen atorvastatin (LIPITOR) 40 MG tablet Take 40 mg by mouth every morning.     . bismuth subsalicylate (PEPTO BISMOL) 262 MG/15ML suspension Take 30 mLs by mouth every 6 (six) hours as needed for indigestion or diarrhea or loose stools.     . clindamycin (CLEOCIN) 300 MG capsule Take  1 capsule (300 mg total) by mouth every 8 (eight) hours for 5 days. 15 capsule 0  . docusate sodium (COLACE) 100 MG capsule Take 1 capsule (100 mg total) by mouth 2 (two) times daily. 10 capsule 0  . ferrous sulfate 325 (65 FE) MG tablet Take 325 mg by mouth daily.     . hydrALAZINE (APRESOLINE) 25 MG tablet Take 25 mg 2 (two) times daily by mouth.    Marland Kitchen HYDROcodone-acetaminophen (NORCO/VICODIN) 5-325 MG tablet Take 1 tablet by mouth every 4 (four) hours as needed for moderate pain. 20 tablet 0  . nicotine (NICODERM CQ - DOSED IN MG/24 HOURS) 21 mg/24hr patch Place 1 patch (21 mg total) onto the skin daily. 28 patch 0  . omeprazole (PRILOSEC) 40 MG capsule Take 40 mg by mouth every morning.     . polyethylene glycol (MIRALAX / GLYCOLAX) packet Take 17 g by mouth daily as needed for mild constipation.     . silver sulfADIAZINE (SILVADENE) 1 % cream Apply to affected area twice daily 400 g 1  . sodium bicarbonate 650 MG tablet Take 1 tablet (650 mg total) by mouth 3 (three) times daily. 20 tablet 0  . vitamin B-12 (CYANOCOBALAMIN) 1000 MCG tablet Take 1 tablet (1,000 mcg total) by mouth daily. 30 tablet 3  . Vitamin D, Ergocalciferol, (DRISDOL) 50000 units CAPS capsule Take 50,000 Units by mouth every Tuesday.      No current facility-administered medications for this visit.    Facility-Administered Medications Ordered in Other Visits  Medication Dose Route Frequency Provider Last Rate Last Dose  . Darbepoetin Alfa (ARANESP) injection 100 mcg  100 mcg Subcutaneous Q30 days Sindy Guadeloupe, MD   100 mcg at 07/25/17 0924  . Darbepoetin Alfa (ARANESP) injection 100 mcg  100 mcg Subcutaneous Q30 days Sindy Guadeloupe, MD   100 mcg at 08/22/17 1015  . epoetin alfa (EPOGEN,PROCRIT) injection 40,000 Units  40,000 Units Subcutaneous Weekly Sindy Guadeloupe, MD   40,000 Units at 05/23/17 1145    Review of Systems Review of Systems  Constitutional: Negative.   Respiratory: Negative.   Cardiovascular:  Negative.     Blood pressure 138/74, pulse 64, resp. rate 15, height 6' (1.829 m), weight 181 lb (82.1 kg).  Physical Exam Physical Exam  Constitutional: He is oriented to person, place, and time. He appears well-developed and well-nourished.  Genitourinary:  Genitourinary Comments: 2 x 6 right gluteal clean wound  Neurological: He is alert and oriented to person, place, and time.  Skin: Skin is warm and dry.  Psychiatric: His behavior is normal.       Assessment    Steady progress in healing by secondary intent.     Plan    Follow up in 6 weeks. The patient is aware to call back for any  questions or new concerns.      HPI, Physical Exam, Assessment and Plan have been scribed under the direction and in the presence of Robert Bellow, MD. Karie Fetch, RN  I have completed the exam and reviewed the above documentation for accuracy and completeness.  I agree with the above.  Haematologist has been used and any errors in dictation or transcription are unintentional.  Hervey Ard, M.D., F.A.C.S.   Karie Fetch M 01/28/2018, 10:53 AM

## 2018-02-02 NOTE — Discharge Summary (Signed)
[facility] [place] NAME: [name]    MR#:  [id number]  DATE OF BIRTH:  [birth year]  DATE OF ADMISSION:  01/21/2018 ADMITTING PHYSICIAN: Amelia Jo, MD  DATE OF DISCHARGE: 01/24/2018  4:09 PM  PRIMARY CARE PHYSICIAN: [name], MD    ADMISSION DIAGNOSIS:  AKI (acute kidney injury) ([place]) [N17.9] Acute pain of right shoulder [M25.511] Hypertension, unspecified type [I10]  DISCHARGE DIAGNOSIS:  Active Problems:   ARF (acute renal failure) ([place])   SECONDARY DIAGNOSIS:   Past Medical History:  Diagnosis Date  . Anemia   . 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 CHF (congestive heart failure) ([place])   . Chronic kidney disease   . COPD (chronic obstructive pulmonary disease) ([place])    NO INHALERS  . Diabetes mellitus    NO MEDS  . GERD (gastroesophageal reflux disease)   . Hepatic cirrhosis ([place])   . Hypertension   . Leg swelling   . Loose, teeth    PATIENT STATES "HAS 6 TEETH, SOME ARE LOOSE"  . Neuromuscular disorder ([place])    RIGHT HAND AND FINGERS NUMB  . Shortness of breath dyspnea    WITH EXERTION   . 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  -Acute on chronic kidney disease stage III Monitor renal function Appreciate nephrology follow-up  Cont IV  fluids.  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  CONSULTS OBTAINED:  Treatment Team:  Murlean Iba, MD Leim Fabry, MD Earnestine Leys, MD  DRUG ALLERGIES:  No Known Allergies  DISCHARGE MEDICATIONS:   Allergies as of 01/24/2018   No Known Allergies     Medication List    STOP taking these medications   chlorthalidone 25 MG tablet Commonly known as:  HYGROTON   sulfamethoxazole-trimethoprim 800-160 MG tablet Commonly known as:  BACTRIM DS,SEPTRA DS     TAKE these medications   amLODipine 10 MG tablet Commonly known as:  NORVASC Take 10 mg by mouth every morning.   atorvastatin 40 MG tablet Commonly known as:  LIPITOR Take 40 mg by mouth every morning.   bismuth subsalicylate 485 IO/27OJ suspension Commonly known as:  PEPTO BISMOL Take 30 mLs by mouth every 6 (six) hours as needed for indigestion or diarrhea or loose stools.   docusate sodium 100 MG capsule Commonly known as:  COLACE Take 1 capsule (100 mg total) by mouth 2 (two) times daily.   ferrous sulfate 325 (65 FE) MG tablet Take 325 mg by mouth daily.   hydrALAZINE 25 MG tablet Commonly known as:  APRESOLINE Take 25 mg 2 (two) times daily by mouth.   HYDROcodone-acetaminophen 5-325 MG tablet Commonly known as:  NORCO/VICODIN Take 1 tablet by mouth every 4 (four) hours as needed for moderate pain.   nicotine 21 mg/24hr patch Commonly known as:  NICODERM CQ - dosed in mg/24 hours Place 1 patch (21 mg total) onto the skin daily.   omeprazole 40 MG capsule Commonly known as:  PRILOSEC Take 40 mg  by mouth every morning.   polyethylene glycol packet Commonly known as:  MIRALAX / GLYCOLAX Take 17 g by mouth daily as needed for mild constipation.   silver sulfADIAZINE 1 % cream Commonly known as:  SILVADENE Apply to affected area twice daily   sodium bicarbonate 650 MG tablet Take 1 tablet (650 mg total) by mouth 3 (three) times daily.   vitamin B-12 1000 MCG tablet Commonly known as:  CYANOCOBALAMIN Take 1 tablet (1,000 mcg total) by mouth daily.   Vitamin D (Ergocalciferol) 50000 units Caps capsule Commonly known as:  DRISDOL Take 50,000 Units by mouth every Tuesday.     ASK your doctor about these medications   clindamycin 300 MG capsule Commonly known as:  CLEOCIN Take 1 capsule (300 mg total) by mouth every 8 (eight) hours for 5 days. Ask about: Should I take this medication?        DISCHARGE INSTRUCTIONS:    follow with Surgery physician in 1 week.  If you experience worsening of your admission symptoms, develop shortness of breath, life threatening emergency, suicidal or homicidal thoughts you must seek medical attention immediately by calling 911 or calling your MD immediately  if symptoms less severe.  You Must read complete instructions/literature along with all the possible adverse reactions/side effects for all the Medicines you take and that have been prescribed to you. Take any new Medicines after you have completely understood and accept all the possible adverse reactions/side effects.   Please note  You were cared for by a hospitalist during your hospital stay. If you have any questions about your discharge medications or the care you received while you were in the hospital after you are discharged, you can call the unit and asked to speak with the hospitalist on call if the hospitalist that took care of you is not available. Once you are discharged, your primary care physician will handle any further medical issues. Please note that NO REFILLS for any  discharge medications will be authorized once you are discharged, as it is imperative that you return to your primary care physician (or establish a relationship with a primary care physician if you do not have one) for your aftercare needs so that they can reassess your need for medications and monitor your lab values.    Today   CHIEF COMPLAINT:   Chief Complaint  Patient presents with  . Shoulder Pain  . Hypertension    HISTORY OF PRESENT ILLNESS:  [name]  is a 67 y.o. male with a known history of chronic anemia, CKD, COPD, diabetes type 2, liver cirrhosis, hypertension, CHF. Patient presented to emergency room for severe, constant pain to the right shoulder, going on for the past week or so, gradually getting worse.  The pain is worse with right arm abduction.  No weakness or swelling in the right upper extremity.  He denies any recent trauma.  No similar episodes in the past.  Patient has been taking over-the-counter Advil 3 times a day every day in the past week with minimal relief  in his pain. Blood test done emergency room are notable for elevated creatinine level at 3.37, hemoglobin 9.4, BUN 58. Right shoulder x-ray shows narrowed humeral acromial distance which can be associated with rotator cuff disease. Chest x-ray is negative for any acute abnormalities. Patient is admitted for further evaluation and treatment.    VITAL SIGNS:  Blood pressure (!) 158/99, pulse 70, temperature 97.6 F (36.4 C), temperature source Oral, resp. rate 16, height 6' (1.829 m), weight 82.9 kg, SpO2 97 %.  I/O:  No intake or output data in the 24 hours ending 02/02/18 1736  PHYSICAL EXAMINATION:   GENERAL:  67 y.o.-year-old patient lying in the bed with no acute distress.  EYES: Pupils equal, round, reactive to light and accommodation. No scleral icterus. Extraocular muscles intact.  HEENT: Head atraumatic, normocephalic. Oropharynx and nasopharynx clear.  NECK:  Supple, no jugular  venous distention. No thyroid enlargement, no tenderness.  LUNGS: Normal breath sounds bilaterally, no wheezing, rales, rhonchi. No use of accessory muscles of respiration.  CARDIOVASCULAR: S1, S2 normal. No murmurs, rubs, or gallops.  ABDOMEN: Soft, nontender, nondistended. Bowel sounds present. No organomegaly or mass.  Buttock area Drainage noted from the biopsy site at the perianal site Bandage changed EXTREMITIES: No cyanosis, clubbing or edema b/l.    NEUROLOGIC: Cranial nerves II through XII are intact. No focal Motor or sensory deficits b/l.   PSYCHIATRIC: The patient is alert and oriented x 3.  SKIN: Very and alleviate discharge from the biopsy site   DATA REVIEW:   CBC No results for input(s): WBC, HGB, HCT, PLT in the last 168 hours.  Chemistries  No results for input(s): NA, K, CL, CO2, GLUCOSE, BUN, CREATININE, CALCIUM, MG, AST, ALT, ALKPHOS, BILITOT in the last 168 hours.  Invalid input(s): GFRCGP  Cardiac Enzymes No results for input(s): TROPONINI in the last 168 hours.  Microbiology Results  Results for orders placed or performed during the hospital encounter of 11/17/17  MRSA PCR Screening     Status: None   Collection Time: 11/18/17  8:08 AM  Result Value Ref Range Status   MRSA by PCR NEGATIVE NEGATIVE Final    Comment:        The GeneXpert MRSA Assay (FDA approved for NASAL specimens only), is one component of a comprehensive MRSA colonization surveillance program. It is not intended to diagnose MRSA infection nor to guide or monitor treatment for MRSA infections. Performed at [facility], [address] [place], [place] [address]     RADIOLOGY:  No results found.  EKG:   Orders placed or performed during the hospital encounter of 01/21/18  . ED EKG  . ED EKG  . EKG      Management plans discussed with the patient, family and they are in agreement.  CODE STATUS:  Code Status History    Date Active Date Inactive Code Status  Order ID Comments User Context   01/21/2018 2300 01/24/2018 1914 Full Code [id number]  Amelia Jo, MD Inpatient   11/17/2017 0318 11/18/2017 1338 Full Code [id number]  [name], MD Inpatient   01/24/2016 1433 01/26/2016 2143 Full Code [id number]  Epifanio Lesches, MD ED   09/13/2015 0748 09/14/2015 1611 Full Code [id number]  Hower, Aaron Mose, MD ED   11/11/2014 1729 11/12/2014 2325 Full Code [id number]  Henreitta Leber, MD Inpatient   11/01/2014 1701 11/04/2014 2027 Full Code [id number]  Marlyce Huge, MD ED      TOTAL TIME TAKING CARE OF THIS PATIENT: 10  minutes.    Vaughan Basta M.D on 02/02/2018 at 5:36 PM  Between 7am to 6pm - Pager - [phone]  After 6pm go to [email or web address] - password [email or web address] [facility]  Office  [phone]  CC: Primary care physician; [name], MD   Note: This dictation was prepared with Dragon dictation along with smaller phrase technology. Any transcriptional errors that result from this process are unintentional.

## 2018-02-05 DIAGNOSIS — M12811 Other specific arthropathies, not elsewhere classified, right shoulder: Secondary | ICD-10-CM | POA: Insufficient documentation

## 2018-02-06 ENCOUNTER — Encounter: Payer: Medicare HMO | Attending: Physician Assistant | Admitting: Physician Assistant

## 2018-02-09 NOTE — Progress Notes (Addendum)
[name], [name] ([id number]) Visit Report for 02/06/2018 Arrival Information Details Patient Name: [name], [name] Date of Service: 02/06/2018 8:15 AM Medical Record Number: [id number] Patient Account Number: [id number] Date of Birth/Sex: 07-07-1950 (67 y.o. M) Treating RN: Montey Hora Primary Care Que Meneely: Lamonte Sakai Other Clinician: Referring Carmelo Reidel: Lamonte Sakai Treating Sherill Mangen/Extender: Melburn Hake, HOYT Weeks in Treatment: 64 Visit Information History Since Last Visit Added or deleted any medications: No Patient Arrived: Ambulatory Any new allergies or adverse reactions: No Arrival Time: 08:21 Had a fall or experienced change in No Accompanied By: brother in activities of daily living that may affect law risk of falls: Transfer Assistance: None Signs or symptoms of abuse/neglect since last visito No Patient Identification Verified: Yes Hospitalized since last visit: Yes Secondary Verification Process Completed: Yes Implantable device outside of the clinic excluding No Patient Requires Transmission-Based No cellular tissue based products placed in the center Precautions: since last visit: Patient Has Alerts: Yes Has Dressing in Place as Prescribed: Yes Patient Alerts: DMII Pain Present Now: No Electronic Signature(s) Signed: 02/06/2018 2:15:53 PM By: Lorine Bears RCP, RRT, CHT Entered By: Lorine Bears on 02/06/2018 08:22:38 [name], [name] ([id number]) -------------------------------------------------------------------------------- Clinic Level of Care Assessment Details Patient Name: [name], [name] Date of Service: 02/06/2018 8:15 AM Medical Record Number: [id number] Patient Account Number: [id number] Date of Birth/Sex: [birth year] (67 y.o. M) Treating RN: Montey Hora Primary Care Brigida Scotti: Lamonte Sakai Other Clinician: Referring Ilia Dimaano: Lamonte Sakai Treating Ellisa Devivo/Extender: Melburn Hake, HOYT Weeks in Treatment: 17 Clinic  Level of Care Assessment Items TOOL 4 Quantity Score []  - Use when only an EandM is performed on FOLLOW-UP visit 0 ASSESSMENTS - Nursing Assessment / Reassessment X - Reassessment of Co-morbidities (includes updates in patient status) 1 10 X- 1 5 Reassessment of Adherence to Treatment Plan ASSESSMENTS - Wound and Skin Assessment / Reassessment []  - Simple Wound Assessment / Reassessment - one wound 0 X- 6 5 Complex Wound Assessment / Reassessment - multiple wounds []  - 0 Dermatologic / Skin Assessment (not related to wound area) ASSESSMENTS - Focused Assessment X - Circumferential Edema Measurements - multi extremities 1 5 []  - 0 Nutritional Assessment / Counseling / Intervention X- 1 5 Lower Extremity Assessment (monofilament, tuning fork, pulses) []  - 0 Peripheral Arterial Disease Assessment (using hand held doppler) ASSESSMENTS - Ostomy and/or Continence Assessment and Care []  - Incontinence Assessment and Management 0 []  - 0 Ostomy Care Assessment and Management (repouching, etc.) PROCESS - Coordination of Care X - Simple Patient / Family Education for ongoing care 1 15 []  - 0 Complex (extensive) Patient / Family Education for ongoing care []  - 0 Staff obtains Programmer, systems, Records, Test Results / Process Orders []  - 0 Staff telephones HHA, Nursing Homes / Clarify orders / etc []  - 0 Routine Transfer to another Facility (non-emergent condition) []  - 0 Routine Hospital Admission (non-emergent condition) []  - 0 New Admissions / Biomedical engineer / Ordering NPWT, Apligraf, etc. []  - 0 Emergency Hospital Admission (emergent condition) X- 1 10 Simple Discharge Coordination [name], [name] ([id number]) []  - 0 Complex (extensive) Discharge Coordination PROCESS - Special Needs []  - Pediatric / Minor Patient Management 0 []  - 0 Isolation Patient Management []  - 0 Hearing / Language / Visual special needs []  - 0 Assessment of Community assistance (transportation, D/C  planning, etc.) []  - 0 Additional assistance / Altered mentation []  - 0 Support Surface(s) Assessment (bed, cushion, seat, etc.) INTERVENTIONS - Wound Cleansing / Measurement []  - Simple Wound Cleansing -  one wound 0 X- 6 5 Complex Wound Cleansing - multiple wounds X- 1 5 Wound Imaging (photographs - any number of wounds) []  - 0 Wound Tracing (instead of photographs) []  - 0 Simple Wound Measurement - one wound X- 6 5 Complex Wound Measurement - multiple wounds INTERVENTIONS - Wound Dressings X - Small Wound Dressing one or multiple wounds 6 10 []  - 0 Medium Wound Dressing one or multiple wounds []  - 0 Large Wound Dressing one or multiple wounds []  - 0 Application of Medications - topical []  - 0 Application of Medications - injection INTERVENTIONS - Miscellaneous []  - External ear exam 0 []  - 0 Specimen Collection (cultures, biopsies, blood, body fluids, etc.) []  - 0 Specimen(s) / Culture(s) sent or taken to Lab for analysis []  - 0 Patient Transfer (multiple staff / Civil Service fast streamer / Similar devices) []  - 0 Simple Staple / Suture removal (25 or less) []  - 0 Complex Staple / Suture removal (26 or more) []  - 0 Hypo / Hyperglycemic Management (close monitor of Blood Glucose) []  - 0 Ankle / Brachial Index (ABI) - do not check if billed separately X- 1 5 Vital Signs [name], [name] ([id number]) Has the patient been seen at the hospital within the last three years: Yes Total Score: 210 Level Of Care: New/Established - Level 5 Electronic Signature(s) Signed: 02/07/2018 5:38:25 PM By: Montey Hora Entered By: Montey Hora on 02/06/2018 09:01:16 [name], [name] ([id number]) -------------------------------------------------------------------------------- Encounter Discharge Information Details Patient Name: [name], [name] Date of Service: 02/06/2018 8:15 AM Medical Record Number: [id number] Patient Account Number: [id number] Date of Birth/Sex: [birth year] (67 y.o.  M) Treating RN: Montey Hora Primary Care Kandy Towery: Lamonte Sakai Other Clinician: Referring Astha Probasco: Lamonte Sakai Treating Dmarcus Decicco/Extender: Melburn Hake, HOYT Weeks in Treatment: 43 Encounter Discharge Information Items Discharge Condition: Stable Ambulatory Status: Ambulatory Discharge Destination: Home Transportation: Private Auto Accompanied By: brother n law Schedule Follow-up Appointment: Yes Clinical Summary of Care: Electronic Signature(s) Signed: 02/07/2018 5:38:25 PM By: Montey Hora Entered By: Montey Hora on 02/06/2018 09:02:24 [name], [name] ([id number]) -------------------------------------------------------------------------------- Lower Extremity Assessment Details Patient Name: [name], [name] Date of Service: 02/06/2018 8:15 AM Medical Record Number: [id number] Patient Account Number: [id number] Date of Birth/Sex: 03-02-1951 (67 y.o. M) Treating RN: Cornell Barman Primary Care Evangelynn Lochridge: Lamonte Sakai Other Clinician: Referring Flannery Cavallero: Lamonte Sakai Treating Errol Ala/Extender: Melburn Hake, HOYT Weeks in Treatment: 55 Edema Assessment Assessed: [Left: No] [Right: No] Edema: [Left: No] [Right: No] Vascular Assessment Pulses: Dorsalis Pedis Palpable: [Left:Yes] [Right:Yes] Posterior Tibial Extremity colors, hair growth, and conditions: Extremity Color: [Left:Hyperpigmented] [Right:Hyperpigmented] Hair Growth on Extremity: [Left:Yes] [Right:Yes] Temperature of Extremity: [Left:Warm] [Right:Warm] Capillary Refill: [Left:< 3 seconds] [Right:< 3 seconds] Toe Nail Assessment Left: Right: Thick: Yes Yes Discolored: Yes Yes Deformed: Yes Yes Improper Length and Hygiene: No No Electronic Signature(s) Signed: 02/07/2018 3:46:30 PM By: Gretta Cool, BSN, RN, CWS, Kim RN, BSN Entered By: Gretta Cool, BSN, RN, CWS, Kim on 02/06/2018 08:39:49 [name], [name] ([id number]) -------------------------------------------------------------------------------- Multi Wound Chart  Details Patient Name: [name], [name] Date of Service: 02/06/2018 8:15 AM Medical Record Number: [id number] Patient Account Number: [id number] Date of Birth/Sex: 1951-02-07 (67 y.o. M) Treating RN: Montey Hora Primary Care Milina Pagett: Lamonte Sakai Other Clinician: Referring Amanii Snethen: Lamonte Sakai Treating Kile Kabler/Extender: Melburn Hake, HOYT Weeks in Treatment: 16 Vital Signs Height(in): 69 Pulse(bpm): 83 Weight(lbs): 168 Blood Pressure(mmHg): 178/83 Body Mass Index(BMI): 25 Temperature(F): 98.1 Respiratory Rate 16 (breaths/min): Photos: [1:No Photos] [14:No Photos] [16:No Photos] Wound Location: [1:Right Toe Great] [14:Left Toe Second] [16:Right,  Dorsal Toe Great] Wounding Event: [1:Gradually Appeared] [14:Gradually Appeared] [16:Not Known] 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] Date Acquired: [1:06/11/2016] [14:09/26/2017] [16:10/29/2017] Weeks of Treatment: [1:71] [14:19] [16:14] Wound Status: [1:Open] [14:Open] [16:Open] Pending Amputation on [1:Yes] [14:No] [16:No] Presentation: Measurements L x W x D [1:0.1x0.1x0.1] [14:2x2.2x0.1] [16:0.5x0.5x0.1] (cm) Area (cm) : [1:0.008] [14:3.456] [16:0.196] Volume (cm) : [1:0.001] [14:0.346] [16:0.02] % Reduction in Area: [1:100.00%] [14:-46.70%] [16:87.80%] % Reduction in Volume: [1:100.00%] [14:-46.60%] [16:87.50%] Classification: [1:Grade 2] [14:Grade 1] [16:Grade 1] Periwound Skin Texture: [1:No Abnormalities Noted] [14:No Abnormalities Noted] [16:No Abnormalities Noted] Periwound Skin Moisture: [1:No Abnormalities Noted] [14:No Abnormalities Noted] [16:No Abnormalities Noted] Periwound Skin Color: [1:No Abnormalities Noted No] [14:No Abnormalities Noted No] [16:No Abnormalities Noted No] Wound Number: [id number] 5  Photos: No Photos No Photos No Photos Wound Location: Right, Distal, Plantar Right Toe Second Left  Toe Great Metatarsal head second Wounding Event: Trauma Gradually Appeared Gradually Appeared Primary Etiology: Diabetic Wound/Ulcer of 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 Lower Extremity Lower Extremity Date Acquired: 02/03/2018 06/11/2016 06/11/2016 Weeks of Treatment: 0 71 71 Wound Status: Open Open Open Pending Amputation on No Yes Yes Presentation: Measurements L x W x D 0.5x0.9x0.1 1.2x2x0.1 0.3x0.3x0.1 (cm) Area (cm) : 0.353 1.885 0.071 Clucas, Caide E. ([id number]) Volume (cm) : 0.035 0.188 0.007 % Reduction in Area: N/A 38.60% 99.50% % Reduction in Volume: N/A 38.80% 99.50% Classification: N/A Grade 2 Grade 2 Periwound Skin Texture: No Abnormalities Noted No Abnormalities Noted No Abnormalities Noted Periwound Skin Moisture: No Abnormalities Noted No Abnormalities Noted No Abnormalities Noted Periwound Skin Color: No Abnormalities Noted No Abnormalities Noted No Abnormalities Noted Tenderness on Palpation: No No No Treatment Notes Electronic Signature(s) Signed: 02/07/2018 5:38:25 PM By: Montey Hora Entered By: Montey Hora on 02/06/2018 08:45:45 Bartling, [name] ([id number]) -------------------------------------------------------------------------------- [place] Details Patient Name: [name], [name] Date of Service: 02/06/2018 8:15 AM Medical Record Number: [id number] Patient Account Number: [id number] Date of Birth/Sex: 1951/03/03 (67 y.o. M) Treating RN: Montey Hora Primary Care Annarae Macnair: Lamonte Sakai Other Clinician: Referring Cruzita Lipa: Lamonte Sakai Treating Emmanual Gauthreaux/Extender: Melburn Hake, HOYT Weeks in Treatment: 34 Active Inactive ` Abuse / Safety / Falls / Self Care Management Nursing Diagnoses: Potential for falls Goals: Patient will remain injury free related to falls Date Initiated: 09/21/2016 Target Resolution Date: 09/14/2017 Goal Status: Active Interventions: Assess fall risk on admission  and as needed Notes: ` Nutrition Nursing Diagnoses: Potential for alteratiion in Nutrition/Potential for imbalanced nutrition Goals: Patient/caregiver agrees to and verbalizes understanding of need to use nutritional supplements and/or vitamins as prescribed Date Initiated: 09/21/2016 Target Resolution Date: 09/14/2017 Goal Status: Active Interventions: Assess patient nutrition upon admission and as needed per policy Notes: ` Orientation to the Wound Care Program Nursing Diagnoses: Knowledge deficit related to the wound healing center program Goals: Patient/caregiver will verbalize understanding of the [facility] Date Initiated: 09/21/2016 Target Resolution Date: 06/15/2017 Goal Status: Active Interventions: DARKBrodie, [name] ([id number]) Provide education on orientation to the wound center Notes: ` Wound/Skin Impairment Nursing Diagnoses: Knowledge deficit related to smoking impact on wound healing Goals: Ulcer/skin breakdown will have a volume reduction of 30% by week 4 Date Initiated: 09/21/2016 Target Resolution Date: 08/17/2017 Goal Status: Active Ulcer/skin breakdown will have a volume reduction of 50% by week 8 Date Initiated: 09/21/2016 Target Resolution Date: 08/17/2017 Goal Status: Active Ulcer/skin breakdown will have a volume reduction of 80% by week 12  Date Initiated: 09/21/2016 Target Resolution Date: 09/14/2017 Goal Status: Active Ulcer/skin breakdown will heal within 14 weeks Date Initiated: 09/21/2016 Target Resolution Date: 08/17/2017 Goal Status: Active Interventions: Assess patient/caregiver ability to obtain necessary supplies Assess patient/caregiver ability to perform ulcer/skin care regimen upon admission and as needed Assess ulceration(s) every visit Notes: Electronic Signature(s) Signed: 02/07/2018 5:38:25 PM By: Montey Hora Entered By: Montey Hora on 02/06/2018 08:45:24 Misner, Arnold E.  ([id number]) -------------------------------------------------------------------------------- Pain Assessment Details Patient Name: Quesinberry, Delmo E. Date of Service: 02/06/2018 8:15 AM Medical Record Number: [id number] Patient Account Number: [id number] Date of Birth/Sex: [birth year] (67 y.o. M) Treating RN: Montey Hora Primary Care Kaydince Towles: Lamonte Sakai Other Clinician: Referring Shawnise Peterkin: Lamonte Sakai Treating Arlisa Leclere/Extender: Melburn Hake, HOYT Weeks in Treatment: 40 Active Problems Location of Pain Severity and Description of Pain Patient Has Paino No Site Locations Pain Management and Medication Current Pain Management: Electronic Signature(s) Signed: 02/06/2018 2:15:53 PM By: Paulla Fore, RRT, CHT Signed: 02/07/2018 5:38:25 PM By: Montey Hora Entered By: Lorine Bears on 02/06/2018 08:22:47 [place]ias, [name] ([id number]) -------------------------------------------------------------------------------- Patient/Caregiver Education Details Patient Name: Guilbert, Ulrick E. Date of Service: 02/06/2018 8:15 AM Medical Record Number: [id number] Patient Account Number: [id number] Date of Birth/Gender: 20-Dec-1950 (67 y.o. M) Treating RN: Montey Hora Primary Care Physician: Lamonte Sakai Other Clinician: Referring Physician: Lamonte Sakai Treating Physician/Extender: Sharalyn Ink in Treatment: 80 Education Assessment Education Provided To: Patient Education Topics Provided Wound/Skin Impairment: Handouts: Other: wound care as ordered Methods: Demonstration, Explain/Verbal Responses: State content correctly Electronic Signature(s) Signed: 02/07/2018 5:38:25 PM By: Montey Hora Entered By: Montey Hora on 02/06/2018 09:01:38 Mazer, [name] ([id number]) -------------------------------------------------------------------------------- Wound Assessment Details Patient Name: Genter, Savalas E. Date of Service: 02/06/2018 8:15 AM Medical  Record Number: [id number] Patient Account Number: [id number] Date of Birth/Sex: 01/30/1951 (67 y.o. M) Treating RN: Cornell Barman Primary Care Elhadj Girton: Lamonte Sakai Other Clinician: Referring [name] [name]: Lamonte Sakai Treating Dalayza Zambrana/Extender: Melburn Hake, HOYT Weeks in Treatment: 54 Wound Status Wound Number: 1 Primary Diabetic Wound/Ulcer of the Lower Extremity Etiology: Wound Location: Right Toe Great Wound Open Wounding Event: Gradually Appeared Status: Date Acquired: 06/11/2016 Comorbid Anemia, Lymphedema, Congestive Heart Weeks Of Treatment: 71 History: Failure, Hypertension, Peripheral Venous Clustered Wound: No Disease, Type II Diabetes, Neuropathy Pending Amputation On Presentation Photos Wound Measurements Length: (cm) 0.1 % Reduction in Width: (cm) 0.1 % Reduction in Depth: (cm) 0.1 Epithelializat Area: (cm) 0.008 Tunneling: Volume: (cm) 0.001 Undermining: Area: 100% Volume: 100% ion: None No No Wound Description Classification: Grade 2 Foul Odor Afte Wound Margin: Flat and Intact Slough/Fibrino Exudate Amount: Medium Exudate Type: Serous Exudate Color: amber r Cleansing: No Yes Wound Bed Granulation Amount: Large (67-100%) Exposed Structure Granulation Quality: Pink Fascia Exposed: No Necrotic Amount: Small (1-33%) Fat Layer (Subcutaneous Tissue) Exposed: Yes Necrotic Quality: Adherent Slough Tendon Exposed: No Muscle Exposed: No Joint Exposed: No Bone Exposed: No Periwound Skin Texture Pricer, Euclide E. ([id number]) Texture Color No Abnormalities Noted: No No Abnormalities Noted: No Callus: No Atrophie Blanche: No Crepitus: No Cyanosis: No Excoriation: No Ecchymosis: No Induration: No Erythema: No Rash: No Hemosiderin Staining: No Scarring: No Mottled: No Pallor: No Moisture Rubor: No No Abnormalities Noted: No Dry / Scaly: No Temperature / Pain Maceration: No Temperature: No Abnormality Wound Preparation Ulcer Cleansing:  Rinsed/Irrigated with Saline Topical Anesthetic Applied: None Electronic Signature(s) Signed: 02/06/2018 3:21:37 PM By: Montey Hora Signed: 02/07/2018 3:46:30 PM By: Gretta Cool, BSN, RN, CWS, Kim RN, BSN Entered By: Montey Hora on 02/06/2018 15:21:35 Donahoo, Lilburn E. ([id number]) --------------------------------------------------------------------------------  Wound Assessment Details Patient Name: Hassey, AUTRY PRUST. Date of Service: 02/06/2018 8:15 AM Medical Record Number: [id number] Patient Account Number: [id number] Date of Birth/Sex: 1950-06-16 (67 y.o. M) Treating RN: Cornell Barman Primary Care Tekoa Hamor: Lamonte Sakai Other Clinician: Referring Anetria Harwick: Lamonte Sakai Treating Cortlin Marano/Extender: Melburn Hake, HOYT Weeks in Treatment: 58 Wound Status Wound Number: 14 Primary Diabetic Wound/Ulcer of the Lower Extremity Etiology: Wound Location: Left Toe Second Wound Open Wounding Event: Gradually Appeared Status: Date Acquired: 09/26/2017 Comorbid Anemia, Lymphedema, Congestive Heart Weeks Of Treatment: 19 History: Failure, Hypertension, Peripheral Venous Clustered Wound: No Disease, Type II Diabetes, Neuropathy Photos Wound Measurements Length: (cm) 2 % Reduction in Width: (cm) 2.2 % Reduction in Depth: (cm) 0.1 Epithelializat Area: (cm) 3.456 Tunneling: Volume: (cm) 0.346 Undermining: Area: -46.7% Volume: -46.6% ion: None No No Wound Description Classification: Grade 1 Foul Odor Afte Wound Margin: Flat and Intact Slough/Fibrino Exudate Amount: Medium Exudate Type: Serous Exudate Color: amber r Cleansing: No Yes Wound Bed Granulation Amount: Large (67-100%) Exposed Structure Granulation Quality: Pink Fascia Exposed: No Necrotic Amount: Small (1-33%) Fat Layer (Subcutaneous Tissue) Exposed: Yes Necrotic Quality: Adherent Slough Tendon Exposed: No Muscle Exposed: No Joint Exposed: No Bone Exposed: No Periwound Skin Texture Texture Color Galik, Teigan E.  ([id number]) No Abnormalities Noted: No No Abnormalities Noted: No Callus: No Atrophie Blanche: No Crepitus: No Cyanosis: No Excoriation: No Ecchymosis: No Induration: No Erythema: No Rash: No Hemosiderin Staining: No Scarring: No Mottled: No Pallor: No Moisture Rubor: No No Abnormalities Noted: No Dry / Scaly: No Maceration: No Wound Preparation Ulcer Cleansing: Rinsed/Irrigated with Saline Topical Anesthetic Applied: None Electronic Signature(s) Signed: 02/06/2018 3:22:16 PM By: Montey Hora Signed: 02/07/2018 3:46:30 PM By: Gretta Cool, BSN, RN, CWS, Kim RN, BSN Entered By: Montey Hora on 02/06/2018 15:22:15 Weiher, Bernice EMarland Kitchen ([id number]) -------------------------------------------------------------------------------- Wound Assessment Details Patient Name: Steger, Toby E. Date of Service: 02/06/2018 8:15 AM Medical Record Number: [id number] Patient Account Number: [id number] Date of Birth/Sex: 07/21/1950 (67 y.o. M) Treating RN: Cornell Barman Primary Care Dulcie Gammon: Lamonte Sakai Other Clinician: Referring Jomarie Gellis: Lamonte Sakai Treating Natsumi Whitsitt/Extender: Melburn Hake, HOYT Weeks in Treatment: 22 Wound Status Wound Number: 16 Primary Diabetic Wound/Ulcer of the Lower Extremity Etiology: Wound Location: Right Toe Great - Dorsal Wound Open Wounding Event: Not Known Status: Date Acquired: 10/29/2017 Comorbid Anemia, Lymphedema, Congestive Heart Weeks Of Treatment: 14 History: Failure, Hypertension, Peripheral Venous Clustered Wound: No Disease, Type II Diabetes, Neuropathy Photos Wound Measurements Length: (cm) 0.5 % Reduction i Width: (cm) 0.5 % Reduction i Depth: (cm) 0.1 Epithelializa Area: (cm) 0.196 Tunneling: Volume: (cm) 0.02 Undermining: n Area: 87.8% n Volume: 87.5% tion: None No No Wound Description Classification: Grade 1 Foul Odor Aft Wound Margin: Flat and Intact Slough/Fibrin Exudate Amount: Medium Exudate Type: Serous Exudate Color: amber er  Cleansing: No o Yes Wound Bed Granulation Amount: Large (67-100%) Exposed Structure Granulation Quality: Red Fascia Exposed: No Necrotic Amount: Small (1-33%) Fat Layer (Subcutaneous Tissue) Exposed: Yes Necrotic Quality: Adherent Slough Tendon Exposed: No Muscle Exposed: No Joint Exposed: No Bone Exposed: No Periwound Skin Texture Texture Color Kovalenko, Geovonni E. ([id number]) No Abnormalities Noted: No No Abnormalities Noted: No Callus: No Atrophie Blanche: No Crepitus: No Cyanosis: No Excoriation: No Ecchymosis: No Induration: No Erythema: No Rash: No Hemosiderin Staining: No Scarring: No Mottled: No Pallor: No Moisture Rubor: No No Abnormalities Noted: No Dry / Scaly: No Temperature / Pain Maceration: No Temperature: No Abnormality Wound Preparation Ulcer Cleansing: Rinsed/Irrigated with Saline Topical Anesthetic Applied: Other: lidocaine 4%, Electronic Signature(s) Signed: 02/06/2018  3:40:02 PM By: Montey Hora Signed: 02/07/2018 3:46:30 PM By: Gretta Cool, BSN, RN, CWS, Kim RN, BSN Entered By: Montey Hora on 02/06/2018 15:40:02 Flenniken, [name] ([id number]) -------------------------------------------------------------------------------- Wound Assessment Details Patient Name: Padia, Dorris E. Date of Service: 02/06/2018 8:15 AM Medical Record Number: [id number] Patient Account Number: [id number] Date of Birth/Sex: 1950-11-20 (67 y.o. M) Treating RN: Cornell Barman Primary Care Demaris Bousquet: Lamonte Sakai Other Clinician: Referring Mykael Batz: Lamonte Sakai Treating Legacy Carrender/Extender: Melburn Hake, HOYT Weeks in Treatment: 12 Wound Status Wound Number: 17 Primary Diabetic Wound/Ulcer of the Lower Extremity Etiology: Wound Location: Right Metatarsal head second - Plantar, Distal Wound Open Status: Wounding Event: Trauma Comorbid Anemia, Lymphedema, Congestive Heart Date Acquired: 02/03/2018 History: Failure, Hypertension, Peripheral Venous Weeks Of Treatment: 0 Disease,  Type II Diabetes, Neuropathy Clustered Wound: No Wound Measurements Length: (cm) 0.5 Width: (cm) 0.9 Depth: (cm) 0.1 Area: (cm) 0.353 Volume: (cm) 0.035 % Reduction in Area: 0% % Reduction in Volume: 0% Epithelialization: None Tunneling: No Undermining: No Wound Description Classification: Grade 1 Wound Margin: Flat and Intact Exudate Amount: Medium Exudate Type: Serous Exudate Color: amber Foul Odor After Cleansing: No Slough/Fibrino No Wound Bed Granulation Amount: Large (67-100%) Exposed Structure Granulation Quality: Pink Fascia Exposed: No Necrotic Amount: None Present (0%) Fat Layer (Subcutaneous Tissue) Exposed: No Tendon Exposed: No Muscle Exposed: No Joint Exposed: No Bone Exposed: No Periwound Skin Texture Texture Color No Abnormalities Noted: No No Abnormalities Noted: No Callus: No Atrophie Blanche: No Crepitus: No Cyanosis: No Excoriation: No Ecchymosis: No Induration: No Erythema: No Rash: No Hemosiderin Staining: No Scarring: No Mottled: No Pallor: No Moisture Rubor: No No Abnormalities Noted: No Dry / Scaly: No Temperature / Pain Maceration: No Temperature: No Abnormality Wieser, Sigurd E. ([id number]) Wound Preparation Ulcer Cleansing: Rinsed/Irrigated with Saline Topical Anesthetic Applied: None Electronic Signature(s) Signed: 02/14/2018 1:05:01 PM By: Montey Hora Signed: 03/03/2018 7:36:17 AM By: Gretta Cool, BSN, RN, CWS, Kim RN, BSN Previous Signature: 02/07/2018 3:46:30 PM Version By: Gretta Cool, BSN, RN, CWS, Kim RN, BSN Entered By: Montey Hora on 02/14/2018 13:05:00 Netzley, Berthel E. ([id number]) -------------------------------------------------------------------------------- Wound Assessment Details Patient Name: Schmader, Nicolo E. Date of Service: 02/06/2018 8:15 AM Medical Record Number: [id number] Patient Account Number: [id number] Date of Birth/Sex: 06-23-1950 (67 y.o. M) Treating RN: Cornell Barman Primary Care Glendon Fiser: Lamonte Sakai  Other Clinician: Referring Airiana Elman: Lamonte Sakai Treating Arianis Bowditch/Extender: Melburn Hake, HOYT Weeks in Treatment: 73 Wound Status Wound Number: 2 Primary Diabetic Wound/Ulcer of the Lower Extremity Etiology: Wound Location: Right Toe Second Wound Open Wounding Event: Gradually Appeared Status: Date Acquired: 06/11/2016 Comorbid Anemia, Lymphedema, Congestive Heart Weeks Of Treatment: 71 History: Failure, Hypertension, Peripheral Venous Clustered Wound: No Disease, Type II Diabetes, Neuropathy Pending Amputation On Presentation Photos Wound Measurements Length: (cm) 1.2 % Reduction in Width: (cm) 2 % Reduction in Depth: (cm) 0.1 Epithelializat Area: (cm) 1.885 Tunneling: Volume: (cm) 0.188 Undermining: Area: 38.6% Volume: 38.8% ion: None No No Wound Description Classification: Grade 2 Foul Odor Afte Wound Margin: Flat and Intact Slough/Fibrino Exudate Amount: Medium Exudate Type: Serous Exudate Color: amber r Cleansing: No Yes Wound Bed Granulation Amount: Large (67-100%) Exposed Structure Granulation Quality: Pink Fascia Exposed: No Necrotic Amount: Small (1-33%) Fat Layer (Subcutaneous Tissue) Exposed: Yes Necrotic Quality: Adherent Slough Tendon Exposed: No Muscle Exposed: No Joint Exposed: No Bone Exposed: No Periwound Skin Texture Winger, Xzavian E. ([id number]) Texture Color No Abnormalities Noted: No No Abnormalities Noted: No Callus: No Atrophie Blanche: No Crepitus: No Cyanosis: No Excoriation: No Ecchymosis: No Induration: No Erythema: No Rash: No  Hemosiderin Staining: No Scarring: No Mottled: No Pallor: No Moisture Rubor: No No Abnormalities Noted: No Dry / Scaly: No Temperature / Pain Maceration: No Temperature: No Abnormality Wound Preparation Ulcer Cleansing: Rinsed/Irrigated with Saline Topical Anesthetic Applied: None Electronic Signature(s) Signed: 02/06/2018 4:00:52 PM By: Montey Hora Signed: 02/07/2018 3:46:30 PM By:  Gretta Cool, BSN, RN, CWS, Kim RN, BSN Entered By: Montey Hora on 02/06/2018 16:00:51 Wandel, [name] ([id number]) -------------------------------------------------------------------------------- Wound Assessment Details Patient Name: Gassen, Carnelius E. Date of Service: 02/06/2018 8:15 AM Medical Record Number: [id number] Patient Account Number: [id number] Date of Birth/Sex: 20-Jul-1950 (67 y.o. M) Treating RN: Cornell Barman Primary Care Jaben Benegas: Lamonte Sakai Other Clinician: Referring Tyshika Baldridge: Lamonte Sakai Treating Aluna Whiston/Extender: Melburn Hake, HOYT Weeks in Treatment: 15 Wound Status Wound Number: 5 Primary Diabetic Wound/Ulcer of the Lower Extremity Etiology: Wound Location: Left Toe Great Wound Open Wounding Event: Gradually Appeared Status: Date Acquired: 06/11/2016 Comorbid Anemia, Lymphedema, Congestive Heart Weeks Of Treatment: 71 History: Failure, Hypertension, Peripheral Venous Clustered Wound: No Disease, Type II Diabetes, Neuropathy Pending Amputation On Presentation Photos Wound Measurements Length: (cm) 0.3 % Reduction in Width: (cm) 0.3 % Reduction in Depth: (cm) 0.1 Epithelializat Area: (cm) 0.071 Tunneling: Volume: (cm) 0.007 Undermining: Area: 99.5% Volume: 99.5% ion: None No No Wound Description Classification: Grade 2 Foul Odor Afte Wound Margin: Flat and Intact Slough/Fibrino Exudate Amount: Medium Exudate Type: Serous Exudate Color: amber r Cleansing: No Yes Wound Bed Granulation Amount: Large (67-100%) Exposed Structure Granulation Quality: Pink Fascia Exposed: No Necrotic Amount: Small (1-33%) Fat Layer (Subcutaneous Tissue) Exposed: Yes Necrotic Quality: Adherent Slough Tendon Exposed: No Muscle Exposed: No Joint Exposed: No Bone Exposed: No Periwound Skin Texture Meine, Jakevion E. ([id number]) Texture Color No Abnormalities Noted: No No Abnormalities Noted: No Callus: No Atrophie Blanche: No Crepitus: No Cyanosis: No Excoriation:  No Ecchymosis: No Induration: No Erythema: No Rash: No Hemosiderin Staining: No Scarring: No Mottled: No Pallor: No Moisture Rubor: No No Abnormalities Noted: No Dry / Scaly: No Maceration: No Wound Preparation Ulcer Cleansing: Rinsed/Irrigated with Saline Topical Anesthetic Applied: None Electronic Signature(s) Signed: 02/06/2018 4:01:25 PM By: Montey Hora Signed: 02/07/2018 3:46:30 PM By: Gretta Cool, BSN, RN, CWS, Kim RN, BSN Entered By: Montey Hora on 02/06/2018 16:01:24 Corlett, [name] ([id number]) -------------------------------------------------------------------------------- Vitals Details Patient Name: Wittke, Donnald E. Date of Service: 02/06/2018 8:15 AM Medical Record Number: [id number] Patient Account Number: [id number] Date of Birth/Sex: November 27, 1950 (67 y.o. M) Treating RN: Montey Hora Primary Care Genice Kimberlin: Lamonte Sakai Other Clinician: Referring Jojo Geving: Lamonte Sakai Treating Viggo Perko/Extender: Melburn Hake, HOYT Weeks in Treatment: 71 Vital Signs Time Taken: 08:23 Temperature (F): 98.1 Height (in): 69 Pulse (bpm): 83 Weight (lbs): 168 Respiratory Rate (breaths/min): 16 Body Mass Index (BMI): 24.8 Blood Pressure (mmHg): 178/83 Reference Range: 80 - 120 mg / dl Electronic Signature(s) Signed: 02/06/2018 2:15:53 PM By: Lorine Bears RCP, RRT, CHT Entered By: Becky Sax, Amado Nash on 02/06/2018 99:37:16

## 2018-02-09 NOTE — Progress Notes (Signed)
[name], [name] ([id number]) Visit Report for 02/06/2018 Chief Complaint Document Details Patient Name: [name], [name] Date of Service: 02/06/2018 8:15 AM Medical Record Number: [id number] Patient Account Number: [id number] Date of Birth/Sex: [birth year] (67 y.o. M) Treating RN: [name] Primary Care Provider: Lamonte [name] Other Clinician: Referring Provider: Lamonte [name] Treating Provider/Extender: [name], [name] [name] in Treatment: 58 Information Obtained from: Patient Chief Complaint Patient presents for treatment of an open diabetic ulcer to both feet Electronic Signature(s) Signed: 02/06/2018 5:20:35 PM By: [name] Keeler PA-C Entered By: [name] on 02/06/2018 08:25:12 [name], [name] ([id number]) -------------------------------------------------------------------------------- HPI Details Patient Name: [name], [name] Date of Service: 02/06/2018 8:15 AM Medical Record Number: [id number] Patient Account Number: [id number] Date of Birth/Sex: November 21, 1950 (67 y.o. M) Treating RN: [name] Primary Care Provider: Lamonte [name] Other Clinician: Referring Provider: Lamonte [name] Treating Provider/Extender: [name], [name] [name] in Treatment: 24 History of Present Illness Location: bilateral feet ulceration on the toes Quality: Patient reports experiencing a dull pain to affected area(s). Severity: Patient states wound are getting better Duration: Patient has had the wound for > 3 months prior to seeking treatment at the wound center Timing: Pain in wound is constant (hurts all the time) Context: The wound would happen gradually Modifying Factors: Other treatment(s) tried include:treatment for lymphedema and is seen by the podiatrist Dr. Caryl [name] Associated Signs and Symptoms: Patient reports having increase swelling. HPI Description: 67 year old patient here to see as for bilateral feet ulceration to on his left first and second toe and 2 on his right first and second  toe, which she's had for about 4 months. 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 The patient is also undergoing workup by medical oncology for a intra-abdominal lymphadenopathy. 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 He is also status post appendectomy, inguinal lymph node biopsy, rectal biopsy and rectal examination under anesthesia. he currently smokes cigarettes about half packet a day. In March of this year he was seen by [name], for evaluation of bilateral varicose veins and besides wearing compression stockings he had recommended laser ablation of the right and left great saphenous veins to eleviate the symptoms and complications of severe superficial venous reflux disease. He also recommended lymphedema pumps for better control of his lymphedema. The patient recently has had on 08/23/2016, right greater saphenous vein ablation with the laser energy Earlier lower extremity venous reflux examination done on 05/08/2016 showed no DVT or SVT both lower legs but incompetence of bilateral great saphenous veins was present. A lower arterial study was also done and there was no significant right lower and left lower extremity problems based on a normal toe brachial index bilaterally and the ABI was 1.21 the left and 1.23 on the right. His post ablation venous duplex examination showed successful ablation of the right GS vein with thrombus formation 2 below the right saphenofemoral junction. The deep system was patent without evidence of thrombosis and this was done on 08/30/2016. the patient also has a squamous cell cancer of the skin of the buttock and is recently undergone radiation therapy for this prior to excisional  surgery. Addendum: regarding his x-rays done today and x-ray of the left foot -- IMPRESSION: No objective evidence of osteomyelitis. There  are soft tissue changes which may reflect cellulitis. X-ray of the right foot -- IMPRESSION:Findings compatible with cellulitis of the toes. No objective evidence of osteomyelitis is observed. 10/01/16 on evaluation today patient's wounds appeared to be doing some better. I did review the x-rays as well which showed no evidence of osteomyelitis although there was evidence on x-ray of cellulitis. He fortunately is not having any discomfort although he continues to have some swelling. He does not remember being on any antibiotics recently. 10/15/16 on evaluation today patient's wounds overall appear to be doing better although he does have a new location noted on the left foot. Fortunately he is not having significant pain. It almost has the appearance that something is rubbing on the end of his toes but he wears the open toe shoes and according to what he is telling me never wears anything that would rub on his foot. There is no evidence of infection and specifically no evidence of a fungal infection 10/22/16 On evaluation today patient's wounds appeared to be doing better compared to last week in regard to his bilateral [name], [name] ([id number]) lower extremities. Fortunately I happy with how things are progressing although he still has ulcers I feel like that he is improving and appropriate manner. 11/12/16 on evaluation today patient appears to be doing well in regard to his bilateral feet and the respective wounds. We have been using surrounding her dressings along with an antifungal cream which seems to be doing very well. He has no bilateral dysfunction noticed that the rituals are weight loss at this point. He also has no nausea or vomiting a note purulent discharge. He did see Vein and vascular today and he tells me that they told him he could have surgery  for his venous stasis but they did not feel like it was worth it in his words. Fortunately patient's wounds do appear to be getting sneakily better. 11/26/2016 -- he says he is going to have some surgery during this week at [facility] for possibly a colon resection. 12/31/2016 -- the patient has been noncompliant with his smoking and I'm not sure whether he is also started drinking again. He continues to be very nonchalant about his care 01/14/2017 -- the patient's HandP has been reviewed well and I understand he is being compliant with trying to give up smoking and his local dressing changes. He does not have any surgical options of 4 to him by his vascular surgeons.he was last seen in early August by [name] who recommended compression stockings,and possibly lymph pumps in 2-3 months after doing a review ultrasound. 01/28/2017 - the patient did not have any fresh complaints but on examination I noted a large lacerated wound on the plantar aspect of his right fourth toe which had a lot of necrotic debris and it probes down to bone. 02/07/2017 -- x-ray of the right foot -- IMPRESSION: Soft tissue swelling about the first through fourth toes consistent with cellulitis. New destructive change in the tuft of the distal phalanx of the great toe is consistent with osteomyelitis. 02/14/2017 -- the patient's MRI is pending this coming Monday and he still continues to smoke. We have again gone over off loading of his wounds in great detail and he says he's been compliant. 02/21/2017 -- MR of the right foot -- IMPRESSION: 1. Soft tissue ulcer at the tip of the first, second and third toe knows. Cortical irregularity and bone marrow edema in the first distal phalanx most concerning  for osteomyelitis. Mild marrow edema in the second and third distal phalanx without definite cortical destruction which may reflect early osteomyelitis versus reactive marrow edema. 2. Soft tissue edema surrounding  the first phalanx most consistent with cellulitis. the patient was also recently evaluated by his medical oncologist Dr. 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 She is treating him with weekly Procrit. She is also keeping intra-abdominal lymphadenopathy and right lower lobe lung nodule under observation. 04/04/2017 -- he was seen by Dr. Adrian Prows on 03/25/2017 -- after review he empirically put him on ciprofloxacin and doxycycline as they have good bone penetration and good bioavailability and it will cover the usual pathogens and diabetic foot osteomyelitis. He will check inflammatory markers and plan a 43-81 week old records. C-reactive protein was 0.3 and the ESR was 72 04/18/17 on evaluation today patient appears to be doing about the same in regard to his lower extremity wounds bilaterally. He has continued to use the antifungal cream which does seem to be beneficial. Nonetheless the ulcers do seem to in some areas be epithelial eyes over and in other areas are still open. He is having no significant discomfort. 04/25/17-he is here in follow-up evaluation for multiple ulcerations to multiple toes bilaterally. He states he did see Dr. Ola Spurr again last week and continues antibiotic therapy. He is voicing no complaints or concerns, will continue with current treatment plan will possibility of adding compression therapy next week after an additional week of treatment/lotions to BLE prescribed by Dr Ola Spurr 05/02/17 he is here in follow up for for multiple ulcers to multiple toes bilaterally. we will stop using antifungal cream and will continue with silvercel and follow up next week 05/09/17-he is here in follow-up for multiple ulcerations to multiple toes bilaterally. There is improvement in appearance. He has not completely stopped using antifungal cream, but admits he has not using it between the toes. He has an  appointment with Dr. Ola Spurr on 2/11, continues on doxycycline and Cipro. It has been 5 [name] of antibiotic therapy, we will order plain film xray to evaluate for osteomyelitis next week, prior to follow up with ID. Will continue with silvercel and follow up next week 05/16/17-he is here in follow-up evaluation for multiple ulcerations to multiple toes bilaterally and new wound to the right posterior heel. There is essentially no change in appearance, deteriorating measurements; he has a history of waxing and waning measurements. He admits that he continues to apply moisturizer/cream/ointment to his toes despite weekly reminders to only apply silvercel to his toes. He states that he thinks the surgical shoe contributed to the superficial ulcer to his posterior Haldeman, Renel E. ([id number]) heel, he is unable to articulate if this was an area of dry cracked skin as he has a similar area to the left heel. He now is wearing open toed slippers. He has an appointment with Dr. Ola Spurr on 2/11. We have ordered x-rays for her bilateral feet; he was advised to obtain the x-rays today or tomorrow. He will follow-up next week 05/23/17-he is here in follow-up evaluation for multiple ulcerations to multiple toes bilaterally and the right posterior heel. There is improvement in maceration. He has been compliant and not applying any moisturizing agent to his toes. He has been using Lac-Hydrin for his lower extremities with improvement. He did not go to his appointment on Monday with Dr. Ola Spurr secondary to financial concerns. X-rays for her bilateral feet  showed: LEFT FOOT with slight erosion of the tuft of the distal phalanges of the left first and second toe suspicious for osteomyelitis, RIGHT FOOT with 1.erosion of the tufts of the distal phalanges of the right first second and possibly third toes consistent with osteomyelitis, 2 no definitive abnormality of the calcaneus is seen on the images obtained, 3.  Plantar calcaneal degenerative spur. We briefly discussed hyperbaric adjunctive therapy for treatment of chronic refractory osteomyelitis. I do not find an a1c in EMR, will contact PCP for record, or order if needed. He has been encouraged to contact Dr Ola Spurr office regarding the follow-up appointment, encouraged him to inquire about payment plan. We will continue with same treatment plan and follow-up next week. He states he is still taking antibiotics and has "a lot" left. He states he has been taking them as directed, 2 pills twice daily. According to Dr. Blane Ohara office notes he was originally started on 12/17 for 4 [name] and extended on 1/14 for an additional 4 [name] He should be done with his antibiotic therapy, he was advised to bring his bottles and to his next appointment, we will contact pharmacy. 05/30/17-he is here in follow-up evaluation for multiple ulcerations to multiple toes bilaterally and the right posterior heel. He is accompanied by his brother-in-law. Wounds are stable. He has yet to make up with Dr. Ola Spurr. We contacted his PCP, with no record of recent A1c we will draw an A1c. His brother-in-law states that he was taken off all of his diabetic medication secondary to kidney function. 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 The antibiotics were counted and have approximately 2 [name] left, although they should be complete. The pill organizer reveals missing days. We discussed the need for consistent medications, to have optimal benefit of medication. He has a cousin that lives with him and he will ask her to check his organizer daily. He has been advised to follow up with Dr Ola Spurr, and will go by the office today. He has been advised to quit smoking. 06/06/17-he is here in follow up evaluation. He has had to make an appointment with Dr. Ola Spurr. He did have  blood work obtained, a1c 5. He continues to take antibiotic therapy. Significant improvement in bilateral lower extremity edema with compression therapy. Essentially no change in ulcerations to toes. He states he is "going to try something different" and "let me know next week" if it works; he would not provide any additional information and was encouraged to follow our orders. We will follow up next week 06/13/17-he is here in follow-up evaluation. He has an appointment with Dr. Ola Spurr tomorrow morning. He states he purchased an ointment from [facility] and applied to his toes for 3 days, he does not remember the name of the ointment. There is improvement to his wounds, minimal maceration. He continues to take antibiotic therapy, this should have been completed last month. His brother-in-law who regularly accompanies his appointments was asked to take the bottles to the appointment tomorrow with Dr. Ola Spurr so he is aware. We will continue with 3 layer compression, and order OPEN TOE compression 20-30mmHg; we will apply compression stockings next week. He continues to smoke, smoked "2 cigarettes" last week 06/20/17 on evaluation today patient did receive his compression stockings which he has with him today for both lower extremities. With that being said he tells me at this point in time that he is very  happy to have these he really is not a big fan of the compression wraps that we have been utilizing although they have been of great benefit for him. Nonetheless at this point he does want to switch to the compression stockings. In my opinion as long as he is continuing with compression I'm okay with the stockings or the wraps. 06/27/17-he is here in follow-up evaluation for multiple ulcerations to his bilateral toes. There is some improvement in appearance. He is compliant in wearing his compression stockings with significant improvement in lower extremity edema. He saw Dr Ola Spurr on 3/8,  per his notes they would redraw ESR and CRP; plan to continue antibiotic therapy if these remain elevated. I do not see an ESR or CRP level in Epic. The patient continues to take antibiotics. 07/11/17-He is here in follow-up evaluation for multiple ulcerations to multiple toes bilaterally. He presents with complete epithelialization to the right third toe; there has been no deterioration. He continues on antibiotic therapy. He will follow-up next week 07/18/17-He is here in follow-up evaluation for multiple ulcerations to multiple toes bilaterally. He continues to make improvement. He continues on antibiotic therapy. He states he has been using something additional to our orders, he does not elaborate but states he will bring it in next week. 07/25/17-He is here in follow up evaluation for multiple ulcerations to bilateral toes. He is stable. He has completed antibiotic therapy. He admits to "filing" his toes after showers each evening, this is what he was referencing last week; he does not filing for the wounds. We will switch to [facility] and monitor for any improvement, he will follow-up next week 08/01/17-He is here in follow-up evaluation. He admits to "picking" at his toes after cleansing yesterday, leading to new areas of tissue loss on the bilateral second toe. There is improvement noted to the bilateral great toe. We will dress toes today and hope that they maintain until Monday where he will come in for a nurse visit. He has been advised, multiple times with expressed verbalization, to change the dressings to silvercel if the dressings get wet prior to [facility] appointment. [name], LAFOND ([id number]) 08/08/17-He is here in follow-up evaluation for bilateral first and second toe ulcerations. There is significant improvement to all ulcerations since last visit. We will switch to [facility] to all wounds and he will continue with nurse visits on a Monday/Thursday schedule and follow-up  with me in 2 [name] He continues to smoke, 1-3 cigarettes per day, and has been encouraged to not smoke until his next follow-up in 2 [name] 08/15/17 on evaluation today patient's ulcers on his toes actually appear to be doing fairly well the right to ulcers may be a little bit more moist compared to the left I'm not really sure exactly why as the openings appear to be very small and I'm not seeing any evidence of anything significant as far as you lightest or otherwise. Nonetheless this may just be a small setback he's been doing very well with the [facility] Dressing 08/22/17-He is here in follow-up evaluation for ulcerations to his bilateral first and second toes, there is small/scant amount of drainage noted on today's dressing. He continues with Hydrofera Blue. He continues to smoke, 1 cigarette a day. Voices no complaint or concerns, compliant and compression therapy today. He'll follow-up next week 08/29/17-He is here in follow-up evaluation for ulcerations to his bilateral first and second toes, with a new wound to the left third toe. He states  he cut himself while cutting the toenail. He presents today with more maceration and increased measurements; waxing and waning measurements and moisture has been an ongoing issue. He continues to smoke a proximally 1 cigarette per day, but states he has not smoked since Sunday. We will initiate medihoney daily and evaluate next week. He presents today without compression stockings 09/05/17-He is here in follow-up evaluation for ulcerations to bilateral first and second and left third toe. He did not pick up the medihoney and therefore has not been changing his dressing. He admits to inconsistent where of compression stockings, admits to pain to his bilateral lower extremities with unilateral edema (right greater than left). The ulcerations to multiple toes are more macerated and larger in size than last week. He is wearing compression therapy today. He  admits to an overall feeling of weakness and fatigue and has been encouraged to contact his PCP for evaluation; he has a known history of anemia and intraabdominal lymphadenopathy. We will return to silvercel and he will follow up next week 09/19/17-He is here in follow-up evaluation for ulcerations to bilateral first and second toe.he was unable to make last week's appointment. The culture that was obtained on 5/30 grew clindamycin sensitive MRSA; he was initiated on 6/4 but did not start it until 6/7. There is significant improvement in all wounds, healing to left second and third toe. He also admits to having no alcohol or smoking for the last 6 days. We will continue with same treatment plan I will extend the clindamycin for an additional 10 days and he will follow-up next week. 09/26/17- He is here in follow evaluation for multiple ulcerations to multiple toes bilaterally. He continues to be non-compliant with dressing, compression therapy. He states he is taking the antibiotics as prescribed, although his recall is inconsistent. There is noted deterioration in all ulcers. We will continue same treatment plan, antibiotic therapy and he will follow up next week. 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 This information is obtained from the medical record, as he cannot articulate specifics to his diagnosis or treatment plan. 10/17/17-He is here in follow-up evaluation for multiple ulcerations to multiple toes bilaterally. There is chronic waxing and waning improvement/deterioration. On today's exam they appear improved, dry without maceration. He continues to apply a variety of topical agents, stating that he has switched between the blue product and silver product. In my opinion, this will be a chronic waxing and waning  giving his intermittent compliance. He will follow-up in 2 [name] As it relates to the biopsy taken from his buttock, he has not followed up with that and has not started radiation therapy. 10/31/17-He is here for evaluation for multiple ulcerations to multiple toes bilaterally. Continues with waxing and waning improvement versus deterioration. They are stable on today's exam without significant maceration. 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 We will continue with every 2 week follow-up appointments 11/14/17- He is seen in follow-up evaluation for multiple ulcerations to multiple bilateral toes. He is scheduled for excision of basal cell carcinoma to his right buttock on Monday 8/12. He continues to have chronic waxing and waning of his ulcers; today is a stable day with minimal drainage and left ear is better than right toes. I will recount to infectious disease regarding chronic suppressive  therapy. He admits to being more compliant with compression stockings, although he is not wearing any today. He has been encouraged to continue with dressing changes as ordered and compression therapy daily. He will be seen in 2 [name] 11/28/17-He is seen in follow up evaluation for multiple ulcerations to multiple toes bilaterally. He had an overnight admission at [facility] (8/11-8/12) for symptomatic anemia, received two units prbc. His surgical date has been moved to 9/94 excision of basal cell carcinoma to the right buttock. He has been noncompliant in wearing compression stockings, applying topical treatment as ordered; he has been applying a "cream", Hydrofera Blue, silvercel. He is currently on no antibiotic therapy; we will send for re-referral to ID for suppressive therapy given his multiple co-morbidities, poor surgical candidate, unwilling to have toe amputation and general non-adherence. 12/19/17 on evaluation today patient actually appears to be doing very  well in regard to his toes at least compared to last time I saw him. He again has been coming to our office for quite a bit of time. 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 He states is hurting a little bit but doing well in general. I'm see were not taking care of this area dermatology is. He does have his appointment with infectious disease upcoming which they re-consult for suppressive therapy that's next Monday. [name], [name] ([id number]) 01/02/18 on evaluation today patient actually appears to be doing rather well in regard to his toe ulcers although he tells me he has been put in an ointment on this which I am concerned may be causing some additional moisture buildup which we really do not want. He supposed be using silver cell which is mainly thing I want him to be utilizing. Fortunately there does not appear to be any evidence of worsening infection which is good news. He has had surgery on his gluteal region where he had an excision of a cancerous area fortunately that seems to be doing well although he is having some discomfort. 02/06/18 on evaluation today patient actually appears to be doing well at all locations except for his second toe use bilaterally where the distal tip is still wrong open. He did state that he can't stand for dry skin to buildup on his toes subsequently as a question and further it sounds as if he actually pills this all which is likely keeping the toe is wrong and open. Also think that's what happened in the bottom of his foot although that appears to have healed at this point. There does not appear to be any evidence of infection. Electronic Signature(s) Signed: 02/06/2018 5:20:35 PM By: [name] Keeler PA-C Entered By: [name] on 02/06/2018 09:16:20 [name], [name]  ([id number]) -------------------------------------------------------------------------------- Physical Exam Details Patient Name: [name], [name] Date of Service: 02/06/2018 8:15 AM Medical Record Number: [id number] Patient Account Number: [id number] Date of Birth/Sex: [birth year] (67 y.o. M) Treating RN: [name] Primary Care Provider: Lamonte [name] Other Clinician: Referring Provider: Lamonte [name] Treating Provider/Extender: [name], Ayson Cherubini [name] in Treatment: 9 Constitutional patient is hypertensive.. Well-nourished and well-hydrated in no acute distress. Respiratory normal breathing without difficulty. clear to auscultation bilaterally. Cardiovascular regular rate and rhythm with normal S1, S2. Psychiatric this patient is able to make decisions and demonstrates good insight into disease process. Alert and Oriented x 3. pleasant and cooperative. Notes Patient's wound bed currently shows evidence of good  epithelialization at most locations. In fact several of the areas appear to be almost if not completely closed although I am gonna continue to watch them one more time. With that being said I did have a conversation with the patient about not peeling skin off of his toes explained that if he has something that he feels that needs to be removed to save it for when I come in and see him on the days when he has appointments and will take care of it at that point. Otherwise I think she's just damaging in causing greater problems in the meantime. Electronic Signature(s) Signed: 02/06/2018 5:20:35 PM By: [name] Keeler PA-C Entered By: [name] on 02/06/2018 09:17:21 [name], [name] ([id number]) -------------------------------------------------------------------------------- Physician Orders Details Patient Name: [name], [name] Date of Service: 02/06/2018 8:15 AM Medical Record Number: [id number] Patient Account Number: [id number] Date of Birth/Sex: 1950/10/16 (67 y.o.  M) Treating RN: [name] Primary Care Provider: Lamonte [name] Other Clinician: Referring Provider: Lamonte [name] Treating Provider/Extender: [name], Yao Hyppolite [name] in Treatment: 62 Verbal / Phone Orders: No Diagnosis Coding ICD-10 Coding Code Description L97.521 Non-pressure chronic ulcer of other part of left foot limited to breakdown of skin L97.511 Non-pressure chronic ulcer of other part of right foot limited to breakdown of skin E11.621 Type 2 diabetes mellitus with foot ulcer I87.323 Chronic venous hypertension (idiopathic) with inflammation of bilateral lower extremity I89.0 Lymphedema, not elsewhere classified F17.218 Nicotine dependence, cigarettes, 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 abuse 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, right ankle and foot Wound Cleansing Wound #1 Right Toe Great o Clean wound with Normal Saline. Wound #14 Left Toe Second o Clean wound with Normal Saline. Wound #16 Right,Dorsal Toe Great o Clean wound with Normal Saline. Wound #2 Right Toe Second o Clean wound with Normal Saline. Wound #5 Left Toe Great o Clean wound with Normal Saline. Anesthetic (add to Medication List) Wound #1 Right Toe Great o Topical Lidocaine 4% cream applied to wound bed prior to debridement (In Clinic Only). Wound #14 Left Toe Second o Topical Lidocaine 4% cream applied to wound bed prior to debridement (In Clinic Only). Wound #16 Right,Dorsal Toe Great o Topical Lidocaine 4% cream applied to wound bed prior to debridement (In Clinic Only). Wound #2 Right Toe Second o Topical Lidocaine 4% cream applied to wound bed prior to debridement (In Clinic Only). Wound #5 Left Toe Great o Topical Lidocaine 4% cream applied to wound bed prior to debridement (In Clinic Only). [name], [name] ([id number]) Primary Wound Dressing Wound #1 Right Toe Great o Silver Alginate Wound #14 Left Toe  Second o Silver Alginate Wound #16 Right,Dorsal Toe Great o Silver Alginate Wound #2 Right Toe Second o Silver Alginate Wound #5 Left Toe Great o Silver Alginate Secondary Dressing Wound #1 Right Toe Great o ABD pad o Dry Gauze o Conform/Kerlix Wound #14 Left Toe Second o ABD pad o Dry Gauze o Conform/Kerlix Wound #16 Right,Dorsal Toe Great o ABD pad o Dry Gauze o Conform/Kerlix Wound #2 Right Toe Second o ABD pad o Dry Gauze o Conform/Kerlix Wound #5 Left Toe Great o ABD pad o Dry Gauze o Conform/Kerlix Dressing Change Frequency Wound #1 Right Toe Great o Change dressing every day. Wound #14 Left Toe Second o Change dressing every day. Wound #16 Right,Dorsal Toe Great o Change dressing every day. Wound #2 Right Toe Second o Change dressing every day. Wound #5 Left [address] Kellyn E. ([id number]) o Change  dressing every day. Follow-up Appointments Wound #1 Right Toe Great o Return Appointment in 3 [name] Wound #14 Left Toe Second o Return Appointment in 3 [name] Wound #16 Right,Dorsal Toe Great o Return Appointment in 3 [name] Wound #2 Right Toe Second o Return Appointment in 3 [name] Wound #5 Left Toe Great o Return Appointment in 3 [name] Edema Control Wound #1 Right Toe Great o Patient to wear own compression stockings Wound #14 Left Toe Second o Patient to wear own compression stockings Wound #16 Right,Dorsal Toe Great o Patient to wear own compression stockings Wound #2 Right Toe Second o Patient to wear own compression stockings Wound #5 Left Toe Great o Patient to wear own compression stockings Additional Orders / Instructions Wound #1 Right Toe Great o Stop Smoking o Increase protein intake. Wound #14 Left Toe Second o Stop Smoking o Increase protein intake. Wound #16 Right,Dorsal Toe Great o Stop Smoking o Increase protein intake. Wound #2 Right Toe  Second o Stop Smoking o Increase protein intake. Wound #5 Left Toe Great o Stop Smoking o Increase protein intake. YON, SCHIFFMAN ([id number]) Electronic Signature(s) Signed: 02/06/2018 5:20:35 PM By: [name] Keeler PA-C Signed: 02/07/2018 5:38:25 PM By: [name] Entered By: [name] on 02/06/2018 08:54:27 [name], [name] ([id number]) -------------------------------------------------------------------------------- Problem List Details Patient Name: Vignola, Edwar E. Date of Service: 02/06/2018 8:15 AM Medical Record Number: [id number] Patient Account Number: [id number] Date of Birth/Sex: Jan 07, 1951 (67 y.o. M) Treating RN: [name] Primary Care Provider: Lamonte [name] Other Clinician: Referring Provider: Lamonte [name] Treating Provider/Extender: [name], Shamyra Farias [name] in Treatment: 52 Active Problems ICD-10 Evaluated Encounter Code Description Active Date Today Diagnosis L97.521 Non-pressure chronic ulcer of other part of left foot limited to 09/21/2016 No Yes breakdown of skin L97.511 Non-pressure chronic ulcer of other part of right foot limited to 09/21/2016 No Yes breakdown of skin E11.621 Type 2 diabetes mellitus with foot ulcer 09/21/2016 No Yes I87.323 Chronic venous hypertension (idiopathic) with inflammation of 09/21/2016 No Yes bilateral lower extremity I89.0 Lymphedema, not elsewhere classified 09/21/2016 No Yes F17.218 Nicotine dependence, cigarettes, with other nicotine-induced 09/21/2016 No Yes disorders F10.19 Alcohol abuse with unspecified alcohol-induced disorder 09/21/2016 No Yes M86.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, right ankle and foot 02/21/2017 No Yes Inactive Problems Resolved Problems Electronic Signature(s) NICKLAUS, ALVIAR ([id number]) Signed: 02/06/2018 5:20:35 PM By: [name] Keeler PA-C Entered By: [name] on 02/06/2018 08:25:03 [name], [name]  ([id number]) -------------------------------------------------------------------------------- Progress Note Details Patient Name: [name], [name] Date of Service: 02/06/2018 8:15 AM Medical Record Number: [id number] Patient Account Number: [id number] Date of Birth/Sex: 12/10/1950 (67 y.o. M) Treating RN: [name] Primary Care Provider: Lamonte [name] Other Clinician: Referring Provider: Lamonte [name] Treating Provider/Extender: [name], Khristi Schiller [name] in Treatment: 38 Subjective Chief Complaint Information obtained from Patient Patient presents for treatment of an open diabetic ulcer to both feet History of Present Illness (HPI) The following HPI elements were documented for the patient's wound: Location: bilateral feet ulceration on the toes Quality: Patient reports experiencing a dull pain to affected area(s). Severity: Patient states wound are getting better Duration: Patient has had the wound for > 3 months prior to seeking treatment at the wound center Timing: Pain in wound is constant (hurts all the time) Context: The wound would happen gradually Modifying Factors: Other treatment(s) tried include:treatment for lymphedema and is seen by the podiatrist Dr. Caryl [name] Associated Signs and Symptoms: Patient reports having increase swelling. 67 year old patient here to see as for bilateral feet ulceration to  on his left first and second toe and 2 on his right first and second toe, which she's had for about 4 months. 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 The patient is also undergoing workup by medical oncology for a intra-abdominal lymphadenopathy. 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 He is also status post appendectomy,  inguinal lymph node biopsy, rectal biopsy and rectal examination under anesthesia. he currently smokes cigarettes about half packet a day. In March of this year he was seen by [name], for evaluation of bilateral varicose veins and besides wearing compression stockings he had recommended laser ablation of the right and left great saphenous veins to eleviate the symptoms and complications of severe superficial venous reflux disease. He also recommended lymphedema pumps for better control of his lymphedema. The patient recently has had on 08/23/2016, right greater saphenous vein ablation with the laser energy Earlier lower extremity venous reflux examination done on 05/08/2016 showed no DVT or SVT both lower legs but incompetence of bilateral great saphenous veins was present. A lower arterial study was also done and there was no significant right lower and left lower extremity problems based on a normal toe brachial index bilaterally and the ABI was 1.21 the left and 1.23 on the right. His post ablation venous duplex examination showed successful ablation of the right GS vein with thrombus formation 2 below the right saphenofemoral junction. The deep system was patent without evidence of thrombosis and this was done on 08/30/2016. 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 Addendum: regarding his x-rays done today and x-ray of the left foot -- IMPRESSION: No objective evidence of osteomyelitis. There are soft tissue changes which may reflect cellulitis. X-ray of the right foot -- IMPRESSION:Findings compatible with cellulitis of the toes. No objective evidence of osteomyelitis is observed. 10/01/16 on evaluation today patient's wounds appeared to be doing some better. I did review the x-rays as well which showed no evidence of osteomyelitis although there was evidence on x-ray  of cellulitis. He fortunately is not having any discomfort [name], [name] ([id number]) although he continues to have some swelling. He does not remember being on any antibiotics recently. 10/15/16 on evaluation today patient's wounds overall appear to be doing better although he does have a new location noted on the left foot. Fortunately he is not having significant pain. It almost has the appearance that something is rubbing on the end of his toes but he wears the open toe shoes and according to what he is telling me never wears anything that would rub on his foot. There is no evidence of infection and specifically no evidence of a fungal infection 10/22/16 On evaluation today patient's wounds appeared to be doing better compared to last week in regard to his bilateral lower extremities. Fortunately I happy with how things are progressing although he still has ulcers I feel like that he is improving and appropriate manner. 11/12/16 on evaluation today patient appears to be doing well in regard to his bilateral feet and the respective wounds. We have been using surrounding her dressings along with an antifungal cream which seems to be doing very well.  He has no bilateral dysfunction noticed that the rituals are weight loss at this point. He also has no nausea or vomiting a note purulent discharge. He did see Vein and vascular today and he tells me that they told him he could have surgery for his venous stasis but they did not feel like it was worth it in his words. Fortunately patient's wounds do appear to be getting sneakily better. 11/26/2016 -- he says he is going to have some surgery during this week at [facility] for possibly a colon resection. 12/31/2016 -- the patient has been noncompliant with his smoking and I'm not sure whether he is also started drinking again. He continues to be very nonchalant about his care 01/14/2017 -- the patient's HandP has been reviewed well and I understand he is  being compliant with trying to give up smoking and his local dressing changes. He does not have any surgical options of 4 to him by his vascular surgeons.he was last seen in early August by [name] who recommended compression stockings,and possibly lymph pumps in 2-3 months after doing a review ultrasound. 01/28/2017 - the patient did not have any fresh complaints but on examination I noted a large lacerated wound on the plantar aspect of his right fourth toe which had a lot of necrotic debris and it probes down to bone. 02/07/2017 -- x-ray of the right foot -- IMPRESSION: Soft tissue swelling about the first through fourth toes consistent with cellulitis. New destructive change in the tuft of the distal phalanx of the great toe is consistent with osteomyelitis. 02/14/2017 -- the patient's MRI is pending this coming Monday and he still continues to smoke. We have again gone over off loading of his wounds in great detail and he says he's been compliant. 02/21/2017 -- MR of the right foot -- IMPRESSION: 1. Soft tissue ulcer at the tip of the first, second and third toe knows. Cortical irregularity and bone marrow edema in the first distal phalanx most concerning for osteomyelitis. Mild marrow edema in the second and third distal phalanx without definite cortical destruction which may reflect early osteomyelitis versus reactive marrow edema. 2. Soft tissue edema surrounding the first phalanx most consistent with cellulitis. the patient was also recently evaluated by his medical oncologist Dr. 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 She is treating him with weekly Procrit. She is also keeping intra-abdominal lymphadenopathy and right lower lobe lung nodule under observation. 04/04/2017 -- he was seen by Dr. Adrian Prows on 03/25/2017 -- after review he empirically put him on ciprofloxacin and doxycycline as they  have good bone penetration and good bioavailability and it will cover the usual pathogens and diabetic foot osteomyelitis. He will check inflammatory markers and plan a 41-72 week old records. C-reactive protein was 0.3 and the ESR was 72 04/18/17 on evaluation today patient appears to be doing about the same in regard to his lower extremity wounds bilaterally. He has continued to use the antifungal cream which does seem to be beneficial. Nonetheless the ulcers do seem to in some areas be epithelial eyes over and in other areas are still open. He is having no significant discomfort. 04/25/17-he is here in follow-up evaluation for multiple ulcerations to multiple toes bilaterally. He states he did see Dr. Ola Spurr again last week and continues antibiotic therapy. He is voicing no complaints or concerns, will continue with current treatment plan will possibility of adding  compression therapy next week after an additional week of treatment/lotions to BLE prescribed by Dr Ola Spurr 05/02/17 he is here in follow up for for multiple ulcers to multiple toes bilaterally. we will stop using antifungal cream and will continue with silvercel and follow up next week Byrd, Ogle E. ([id number]) 05/09/17-he is here in follow-up for multiple ulcerations to multiple toes bilaterally. There is improvement in appearance. He has not completely stopped using antifungal cream, but admits he has not using it between the toes. He has an appointment with Dr. Ola Spurr on 2/11, continues on doxycycline and Cipro. It has been 5 [name] of antibiotic therapy, we will order plain film xray to evaluate for osteomyelitis next week, prior to follow up with ID. Will continue with silvercel and follow up next week 05/16/17-he is here in follow-up evaluation for multiple ulcerations to multiple toes bilaterally and new wound to the right posterior heel. There is essentially no change in appearance, deteriorating measurements; he has a  history of waxing and waning measurements. He admits that he continues to apply moisturizer/cream/ointment to his toes despite weekly reminders to only apply silvercel to his toes. He states that he thinks the surgical shoe contributed to the superficial ulcer to his posterior heel, he is unable to articulate if this was an area of dry cracked skin as he has a similar area to the left heel. He now is wearing open toed slippers. He has an appointment with Dr. Ola Spurr on 2/11. We have ordered x-rays for her bilateral feet; he was advised to obtain the x-rays today or tomorrow. He will follow-up next week 05/23/17-he is here in follow-up evaluation for multiple ulcerations to multiple toes bilaterally and the right posterior heel. There is improvement in maceration. He has been compliant and not applying any moisturizing agent to his toes. He has been using Lac-Hydrin for his lower extremities with improvement. He did not go to his appointment on Monday with Dr. Ola Spurr secondary to financial concerns. X-rays for her bilateral feet showed: LEFT FOOT with slight erosion of the tuft of the distal phalanges of the left first and second toe suspicious for osteomyelitis, RIGHT FOOT with 1.erosion of the tufts of the distal phalanges of the right first second and possibly third toes consistent with osteomyelitis, 2 no definitive abnormality of the calcaneus is seen on the images obtained, 3. Plantar calcaneal degenerative spur. We briefly discussed hyperbaric adjunctive therapy for treatment of chronic refractory osteomyelitis. I do not find an a1c in EMR, will contact PCP for record, or order if needed. He has been encouraged to contact Dr Ola Spurr office regarding the follow-up appointment, encouraged him to inquire about payment plan. We will continue with same treatment plan and follow-up next week. He states he is still taking antibiotics and has "a lot" left. He states he has been taking them as  directed, 2 pills twice daily. According to Dr. Blane Ohara office notes he was originally started on 12/17 for 4 [name] and extended on 1/14 for an additional 4 [name] He should be done with his antibiotic therapy, he was advised to bring his bottles and to his next appointment, we will contact pharmacy. 05/30/17-he is here in follow-up evaluation for multiple ulcerations to multiple toes bilaterally and the right posterior heel. He is accompanied by his brother-in-law. Wounds are stable. He has yet to make up with Dr. Ola Spurr. We contacted his PCP, with no record of recent A1c we will draw an A1c. His brother-in-law states that he was taken  off all of his diabetic medication secondary to kidney function. 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 The antibiotics were counted and have approximately 2 [name] left, although they should be complete. The pill organizer reveals missing days. We discussed the need for consistent medications, to have optimal benefit of medication. He has a cousin that lives with him and he will ask her to check his organizer daily. He has been advised to follow up with Dr Ola Spurr, and will go by the office today. He has been advised to quit smoking. 06/06/17-he is here in follow up evaluation. He has had to make an appointment with Dr. Ola Spurr. He did have blood work obtained, a1c 5. He continues to take antibiotic therapy. Significant improvement in bilateral lower extremity edema with compression therapy. Essentially no change in ulcerations to toes. He states he is "going to try something different" and "let me know next week" if it works; he would not provide any additional information and was encouraged to follow our orders. We will follow up next week 06/13/17-he is here in follow-up evaluation. He has an appointment with Dr. Ola Spurr tomorrow morning. He states  he purchased an ointment from [facility] and applied to his toes for 3 days, he does not remember the name of the ointment. There is improvement to his wounds, minimal maceration. He continues to take antibiotic therapy, this should have been completed last month. His brother-in-law who regularly accompanies his appointments was asked to take the bottles to the appointment tomorrow with Dr. Ola Spurr so he is aware. We will continue with 3 layer compression, and order OPEN TOE compression 20-30mmHg; we will apply compression stockings next week. He continues to smoke, smoked "2 cigarettes" last week 06/20/17 on evaluation today patient did receive his compression stockings which he has with him today for both lower extremities. With that being said he tells me at this point in time that he is very happy to have these he really is not a big fan of the compression wraps that we have been utilizing although they have been of great benefit for him. Nonetheless at this point he does want to switch to the compression stockings. In my opinion as long as he is continuing with compression I'm okay with the stockings or the wraps. 06/27/17-he is here in follow-up evaluation for multiple ulcerations to his bilateral toes. There is some improvement in appearance. He is compliant in wearing his compression stockings with significant improvement in lower extremity edema. He saw Dr Ola Spurr on 3/8, per his notes they would redraw ESR and CRP; plan to continue antibiotic therapy if these remain elevated. I do not see an ESR or CRP level in Epic. The patient continues to take antibiotics. 07/11/17-He is here in follow-up evaluation for multiple ulcerations to multiple toes bilaterally. He presents with complete epithelialization to the right third toe; there has been no deterioration. He continues on antibiotic therapy. He will follow-up next week 07/18/17-He is here in follow-up evaluation for multiple ulcerations to  multiple toes bilaterally. He continues to make improvement. He continues on antibiotic therapy. He states he has been using something additional to our orders, he does Jenison, Haywood E. ([id number]) not elaborate but states he will bring it in next week. 07/25/17-He is here in follow up evaluation for multiple ulcerations to bilateral toes. He is stable. He has completed antibiotic therapy. He admits to "filing" his toes after showers each  evening, this is what he was referencing last week; he does not filing for the wounds. We will switch to [facility] and monitor for any improvement, he will follow-up next week 08/01/17-He is here in follow-up evaluation. He admits to "picking" at his toes after cleansing yesterday, leading to new areas of tissue loss on the bilateral second toe. There is improvement noted to the bilateral great toe. We will dress toes today and hope that they maintain until Monday where he will come in for a nurse visit. He has been advised, multiple times with expressed verbalization, to change the dressings to silvercel if the dressings get wet prior to [facility] appointment. 08/08/17-He is here in follow-up evaluation for bilateral first and second toe ulcerations. There is significant improvement to all ulcerations since last visit. We will switch to [facility] to all wounds and he will continue with nurse visits on a Monday/Thursday schedule and follow-up with me in 2 [name] He continues to smoke, 1-3 cigarettes per day, and has been encouraged to not smoke until his next follow-up in 2 [name] 08/15/17 on evaluation today patient's ulcers on his toes actually appear to be doing fairly well the right to ulcers may be a little bit more moist compared to the left I'm not really sure exactly why as the openings appear to be very small and I'm not seeing any evidence of anything significant as far as you lightest or otherwise. Nonetheless this may just be a small setback  he's been doing very well with the [facility] Dressing 08/22/17-He is here in follow-up evaluation for ulcerations to his bilateral first and second toes, there is small/scant amount of drainage noted on today's dressing. He continues with Hydrofera Blue. He continues to smoke, 1 cigarette a day. Voices no complaint or concerns, compliant and compression therapy today. He'll follow-up next week 08/29/17-He is here in follow-up evaluation for ulcerations to his bilateral first and second toes, with a new wound to the left third toe. He states he cut himself while cutting the toenail. He presents today with more maceration and increased measurements; waxing and waning measurements and moisture has been an ongoing issue. He continues to smoke a proximally 1 cigarette per day, but states he has not smoked since Sunday. We will initiate medihoney daily and evaluate next week. He presents today without compression stockings 09/05/17-He is here in follow-up evaluation for ulcerations to bilateral first and second and left third toe. He did not pick up the medihoney and therefore has not been changing his dressing. He admits to inconsistent where of compression stockings, admits to pain to his bilateral lower extremities with unilateral edema (right greater than left). The ulcerations to multiple toes are more macerated and larger in size than last week. He is wearing compression therapy today. He admits to an overall feeling of weakness and fatigue and has been encouraged to contact his PCP for evaluation; he has a known history of anemia and intraabdominal lymphadenopathy. We will return to silvercel and he will follow up next week 09/19/17-He is here in follow-up evaluation for ulcerations to bilateral first and second toe.he was unable to make last week's appointment. The culture that was obtained on 5/30 grew clindamycin sensitive MRSA; he was initiated on 6/4 but did not start it until 6/7. There is  significant improvement in all wounds, healing to left second and third toe. He also admits to having no alcohol or smoking for the last 6 days. We will continue with same  treatment plan I will extend the clindamycin for an additional 10 days and he will follow-up next week. 09/26/17- He is here in follow evaluation for multiple ulcerations to multiple toes bilaterally. He continues to be non-compliant with dressing, compression therapy. He states he is taking the antibiotics as prescribed, although his recall is inconsistent. There is noted deterioration in all ulcers. We will continue same treatment plan, antibiotic therapy and he will follow up next week. 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 This information is obtained from the medical record, as he cannot articulate specifics to his diagnosis or treatment plan. 10/17/17-He is here in follow-up evaluation for multiple ulcerations to multiple toes bilaterally. There is chronic waxing and waning improvement/deterioration. On today's exam they appear improved, dry without maceration. He continues to apply a variety of topical agents, stating that he has switched between the blue product and silver product. In my opinion, this will be a chronic waxing and waning giving his intermittent compliance. He will follow-up in 2 [name] As it relates to the biopsy taken from his buttock, he has not followed up with that and has not started radiation therapy. 10/31/17-He is here for evaluation for multiple ulcerations to multiple toes bilaterally. Continues with waxing and waning improvement versus deterioration. They are stable on today's exam without significant maceration. 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 We will continue with every 2 week  follow-up appointments 11/14/17- He is seen in follow-up evaluation for multiple ulcerations to multiple bilateral toes. He is scheduled for excision of basal cell carcinoma to his right buttock on Monday 8/12. He continues to have chronic waxing and waning of his ulcers; today is a stable day with minimal drainage and left ear is better than right toes. I will recount to infectious disease regarding chronic suppressive therapy. He admits to being more compliant with compression stockings, although he is not wearing any today. He has been encouraged to continue with dressing changes as ordered and compression therapy daily. He will be seen in 2 [name] 11/28/17-He is seen in follow up evaluation for multiple ulcerations to multiple toes bilaterally. He had an overnight admission at [facility] (8/11-8/12) for symptomatic anemia, received two units prbc. His surgical date has been moved to 9/94 excision of basal cell carcinoma to the right buttock. He has been noncompliant in wearing compression stockings, applying topical treatment as ordered; he has been applying a "cream", Hydrofera Blue, silvercel. He is currently on no antibiotic Mezquita, Jamey E. ([id number]) therapy; we will send for re-referral to ID for suppressive therapy given his multiple co-morbidities, poor surgical candidate, unwilling to have toe amputation and general non-adherence. 12/19/17 on evaluation today patient actually appears to be doing very well in regard to his toes at least compared to last time I saw him. He again has been coming to our office for quite a bit of time. 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 He states is hurting a little bit but doing well in general. I'm see were not taking care of this area dermatology is. He does have his appointment with infectious disease upcoming which they re-consult for  suppressive therapy that's next Monday. 01/02/18 on evaluation today  patient actually appears to be doing rather well in regard to his toe ulcers although he tells me he has been put in an ointment on this which I am concerned may be causing some additional moisture buildup which we really do not want. He supposed be using silver cell which is mainly thing I want him to be utilizing. Fortunately there does not appear to be any evidence of worsening infection which is good news. He has had surgery on his gluteal region where he had an excision of a cancerous area fortunately that seems to be doing well although he is having some discomfort. 02/06/18 on evaluation today patient actually appears to be doing well at all locations except for his second toe use bilaterally where the distal tip is still wrong open. He did state that he can't stand for dry skin to buildup on his toes subsequently as a question and further it sounds as if he actually pills this all which is likely keeping the toe is wrong and open. Also think that's what happened in the bottom of his foot although that appears to have healed at this point. There does not appear to be any evidence of infection. Patient History Information obtained from Patient. Social History Current every day smoker, Marital Status - Widowed, Alcohol Use - Daily - quit drinking about a week ago, Drug Use - No History, Caffeine Use - Moderate. 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 Respiratory The patient has no complaints or symptoms. Cardiovascular The patient has no complaints or symptoms. Psychiatric The patient has no complaints or symptoms. Objective Constitutional patient is hypertensive.. Well-nourished and well-hydrated in no acute distress. Vitals Time  Taken: 8:23 AM, Height: 69 in, Weight: 168 lbs, BMI: 24.8, Temperature: 98.1 F, Pulse: 83 bpm, Respiratory Rate: 16 breaths/min, Blood Pressure: 178/83 mmHg. Moncada, Henning E. ([id number]) Respiratory normal breathing without difficulty. clear to auscultation bilaterally. Cardiovascular regular rate and rhythm with normal S1, S2. Psychiatric this patient is able to make decisions and demonstrates good insight into disease process. Alert and Oriented x 3. pleasant and cooperative. General Notes: Patient's wound bed currently shows evidence of good epithelialization at most locations. In fact several of the areas appear to be almost if not completely closed although I am gonna continue to watch them one more time. With that being said I did have a conversation with the patient about not peeling skin off of his toes explained that if he has something that he feels that needs to be removed to save it for when I come in and see him on the days when he has appointments and will take care of it at that point. Otherwise I think she's just damaging in causing greater problems in the meantime. Integumentary (Hair, Skin) Wound #1 status is Open. Original cause of wound was Gradually Appeared. The wound is located on the Right Toe Great. The wound measures 0.1cm length x 0.1cm width x 0.1cm depth; 0.008cm^2 area and 0.001cm^3 volume. Wound #14 status is Open. Original cause of wound was Gradually Appeared. The wound is located on the Left Toe Second. The wound measures 2cm length x 2.2cm width x 0.1cm depth; 3.456cm^2 area and 0.346cm^3 volume. Wound #16 status is Open. Original cause of wound was Not Known. The wound is located on the Right,Dorsal [employer] The wound measures 0.5cm length x 0.5cm width  x 0.1cm depth; 0.196cm^2 area and 0.02cm^3 volume. Wound #17 status is Open. Original cause of wound was Trauma. The wound is located on the Right,Distal,Plantar Metatarsal head second. The wound measures  0.5cm length x 0.9cm width x 0.1cm depth; 0.353cm^2 area and 0.035cm^3 volume. Wound #2 status is Open. Original cause of wound was Gradually Appeared. The wound is located on the Right Toe Second. The wound measures 1.2cm length x 2cm width x 0.1cm depth; 1.885cm^2 area and 0.188cm^3 volume. Wound #5 status is Open. Original cause of wound was Gradually Appeared. The wound is located on the Left Toe Great. The wound measures 0.3cm length x 0.3cm width x 0.1cm depth; 0.071cm^2 area and 0.007cm^3 volume. 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 ([id number]) Plan Wound Cleansing: Wound #1 Right Toe Great: Clean wound with Normal Saline. Wound #14 Left Toe Second: Clean wound with Normal Saline. Wound #16 Right,Dorsal Toe Great: Clean wound with Normal Saline. Wound #2 Right Toe Second: Clean wound with Normal Saline. Wound #5 Left Toe Great: Clean wound with Normal Saline. Anesthetic (add to Medication List): Wound #1 Right Toe Great: Topical Lidocaine 4% cream applied to wound bed prior to debridement (In Clinic Only). Wound #14 Left Toe Second: Topical Lidocaine 4% cream applied to wound bed prior to debridement (In Clinic Only). Wound #16 Right,Dorsal Toe Great: Topical Lidocaine 4% cream applied to wound bed prior to debridement (In Clinic Only). Wound #2 Right Toe Second: Topical Lidocaine 4% cream applied to wound bed prior to debridement (In Clinic  Only). Wound #5 Left Toe Great: Topical Lidocaine 4% cream applied to wound bed prior to debridement (In Clinic Only). Primary Wound Dressing: Wound #1 Right Toe Great: Silver Alginate Wound #14 Left Toe Second: Silver Alginate Wound #16 Right,Dorsal Toe Great: Silver Alginate Wound #2 Right Toe Second: Silver Alginate Wound #5 Left Toe Great: Silver Alginate Secondary Dressing: Wound #1 Right Toe Great: ABD pad Dry Gauze Conform/Kerlix Wound #14 Left Toe Second: ABD pad Dry Gauze Conform/Kerlix Wound #16 Right,Dorsal Toe Great: ABD pad Dry Gauze Conform/Kerlix Wound #2 Right Toe Second: ABD pad Dry Gauze Conform/Kerlix Wound #5 Left Toe Great: ABD pad Dry Gauze Conform/Kerlix Dressing Change Frequency: Wound #1 Right Toe GreatGEMAYEL, MASCIO E. ([id number]) Change dressing every day. Wound #14 Left Toe Second: Change dressing every day. Wound #16 Right,Dorsal Toe Great: Change dressing every day. Wound #2 Right Toe Second: Change dressing every day. Wound #5 Left Toe Great: Change dressing every day. Follow-up Appointments: Wound #1 Right Toe Great: Return Appointment in 3 [name] Wound #14 Left Toe Second: Return Appointment in 3 [name] Wound #16 Right,Dorsal Toe Great: Return Appointment in 3 [name] Wound #2 Right Toe Second: Return Appointment in 3 [name] Wound #5 Left Toe Great: Return Appointment in 3 [name] Edema Control: Wound #1 Right Toe Great: Patient to wear own compression stockings Wound #14 Left Toe Second: Patient to wear own compression stockings Wound #16 Right,Dorsal Toe Great: Patient to wear own compression stockings Wound #2 Right Toe Second: Patient to wear own compression stockings Wound #5 Left Toe Great: Patient to wear own  compression stockings Additional Orders / Instructions: Wound #1 Right Toe Great: Stop Smoking Increase protein intake. Wound #14 Left Toe Second: Stop Smoking Increase protein intake. Wound #16  Right,Dorsal Toe Great: Stop Smoking Increase protein intake. Wound #2 Right Toe Second: Stop Smoking Increase protein intake. Wound #5 Left Toe Great: Stop Smoking Increase protein intake. At this point my suggestion is gonna be that we go ahead and continue with the above wound care measures for the next week. Patient is in agreement with plan. We will subsequently see were things stand at follow-up. I did advise him again to not PO any skin off of his toes her feet. I recommend lotion only and nothing more. Please see above for specific wound care orders. We will see patient for re-evaluation in 2 week(s) here in the clinic. If anything worsens or changes patient will contact our office for additional recommendations. HANDY, MCLOUD ([id number]) Electronic Signature(s) Signed: 02/06/2018 5:20:35 PM By: [name] Keeler PA-C Entered By: [name] on 02/06/2018 09:17:58 Headley, [name] ([id number]) -------------------------------------------------------------------------------- ROS/PFSH Details Patient Name: Mazzie, Gregor E. Date of Service: 02/06/2018 8:15 AM Medical Record Number: [id number] Patient Account Number: [id number] Date of Birth/Sex: [birth year] (67 y.o. M) Treating RN: [name] Primary Care Provider: Lamonte [name] Other Clinician: Referring Provider: Lamonte [name] Treating Provider/Extender: [name], [name] [name] in Treatment: 12 Information Obtained From Patient Wound History Do you currently have one or more open woundso Yes How many open wounds do you currently haveo 6 Approximately how long have you had your woundso 3 months How have you been treating your wound(s) until nowo ointment and bandage Has your wound(s) ever healed and then re-openedo No Have you had any lab work done in the past montho No Have you tested positive for an antibiotic resistant organism (MRSA, VRE)o No Have you tested positive for osteomyelitis (bone infection)o No Have you had  any tests for circulation on your legso Yes Who ordered the testo PCP Where was the test doneo [facility] Constitutional Symptoms (General Health) Complaints and Symptoms: Negative for: Fever; Chills Eyes Medical History: Negative for: Cataracts; Glaucoma; Optic Neuritis Ear/Nose/Mouth/Throat Medical History: Negative for: Chronic sinus problems/congestion; Middle ear problems Hematologic/Lymphatic Medical History: Positive for: Anemia; Lymphedema Negative for: Hemophilia; Human Immunodeficiency Virus; Sickle Cell Disease Respiratory Complaints and Symptoms: No Complaints or Symptoms Medical History: Negative for: Aspiration; Asthma; Chronic Obstructive Pulmonary Disease (COPD); Pneumothorax; Sleep Apnea; Tuberculosis Cardiovascular Complaints and Symptoms: No Complaints or Symptoms Hammerstrom, Zaniel E. ([id number]) Medical History: Positive for: Congestive Heart Failure; Hypertension; Peripheral Venous Disease Negative for: Angina; Arrhythmia; Coronary Artery Disease; Deep Vein Thrombosis; Hypotension; Myocardial Infarction; Peripheral Arterial Disease; Phlebitis; Vasculitis Gastrointestinal Medical History: Negative for: Cirrhosis ; Colitis; Crohnos; Hepatitis A; Hepatitis B; Hepatitis C Endocrine Medical History: Positive for: Type II Diabetes Treated with: Oral agents Blood sugar tested every day: Yes Tested : QD Genitourinary Medical History: Negative for: End Stage Renal Disease Immunological Medical History: Negative for: Lupus Erythematosus; Raynaudos; Scleroderma Integumentary (Skin) Medical History: Negative for: History of Burn; History of pressure wounds Musculoskeletal Medical History: Negative for: Gout; Rheumatoid Arthritis; Osteoarthritis; Osteomyelitis Neurologic Medical History: Positive for: Neuropathy Negative for: Dementia; Quadriplegia; Paraplegia; Seizure Disorder Oncologic Medical History: Past Medical History Notes: squamous cell cancer of skin  of buttock with unknown treatment Psychiatric Complaints and Symptoms: No Complaints or Symptoms Immunizations Pneumococcal Vaccine: Received Pneumococcal Vaccination: No Dippolito, Abdulhamid E. ([id number]) Immunization Notes: up to date Implantable Devices Family and Social History Current every day smoker; Marital Status -  Widowed; Alcohol Use: Daily - quit drinking about a week ago; Drug Use: No History; Caffeine Use: Moderate; Financial Concerns: No; Food, Clothing or Shelter Needs: No; Support System Lacking: No; Transportation Concerns: No; Advanced Directives: No; Patient does not want information on Advanced Directives Physician Affirmation I have reviewed and agree with the above information. Electronic Signature(s) Signed: 02/06/2018 5:20:35 PM By: [name] Keeler PA-C Signed: 02/07/2018 5:38:25 PM By: [name] Entered By: [name] on 02/06/2018 09:16:38 [name], [name] ([id number]) -------------------------------------------------------------------------------- SuperBill Details Patient Name: Chamberlin, Antinio E. Date of Service: 02/06/2018 Medical Record Number: [id number] Patient Account Number: [id number] Date of Birth/Sex: 11/04/1950 (67 y.o. 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 I89.0 Lymphedema, not elsewhere classified F17.218 Nicotine dependence, cigarettes, 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 abuse with unspecified alcohol-induced  disorder M86.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, right ankle and foot Physician Procedures CPT4 Code Description: [id number] - WC PHYS LEVEL 4 - EST PT ICD-10 Diagnosis Description L97.521 Non-pressure chronic ulcer of other part of left foot limited to L97.511 Non-pressure chronic ulcer of other part of right foot limited t E11.621 Type 2  diabetes mellitus with foot ulcer I87.323 Chronic venous hypertension (idiopathic) with inflammation of bi Modifier: breakdown of s o breakdown of lateral lower e Quantity: 1 kin skin xtremity Electronic Signature(s) Signed: 02/06/2018 5:20:35 PM By: [name] Keeler PA-C Entered By: [name] on 02/06/2018 09:18:13

## 2018-02-11 ENCOUNTER — Ambulatory Visit: Payer: Medicare HMO

## 2018-02-11 ENCOUNTER — Other Ambulatory Visit: Payer: Medicare HMO

## 2018-02-20 ENCOUNTER — Encounter: Payer: Medicare HMO | Attending: Physician Assistant | Admitting: Physician Assistant

## 2018-02-20 DIAGNOSIS — D631 Anemia in chronic kidney disease: Secondary | ICD-10-CM | POA: Diagnosis not present

## 2018-02-20 DIAGNOSIS — I87323 Chronic venous hypertension (idiopathic) with inflammation of bilateral lower extremity: Secondary | ICD-10-CM | POA: Diagnosis not present

## 2018-02-20 DIAGNOSIS — E11621 Type 2 diabetes mellitus with foot ulcer: Secondary | ICD-10-CM | POA: Insufficient documentation

## 2018-02-20 DIAGNOSIS — M86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, right ankle and foot: Secondary | ICD-10-CM | POA: Insufficient documentation

## 2018-02-20 DIAGNOSIS — L97511 Non-pressure chronic ulcer of other part of right foot limited to breakdown of skin: Secondary | ICD-10-CM | POA: Diagnosis not present

## 2018-02-20 DIAGNOSIS — I890 Lymphedema, not elsewhere classified: Secondary | ICD-10-CM | POA: Insufficient documentation

## 2018-02-20 DIAGNOSIS — I130 Hypertensive heart and chronic kidney disease with heart failure and stage 1 through stage 4 chronic kidney disease, or unspecified chronic kidney disease: Secondary | ICD-10-CM | POA: Insufficient documentation

## 2018-02-20 DIAGNOSIS — F17218 Nicotine dependence, cigarettes, with other nicotine-induced disorders: Secondary | ICD-10-CM | POA: Diagnosis not present

## 2018-02-20 DIAGNOSIS — E1140 Type 2 diabetes mellitus with diabetic neuropathy, unspecified: Secondary | ICD-10-CM | POA: Insufficient documentation

## 2018-02-20 DIAGNOSIS — I509 Heart failure, unspecified: Secondary | ICD-10-CM | POA: Insufficient documentation

## 2018-02-20 DIAGNOSIS — L97521 Non-pressure chronic ulcer of other part of left foot limited to breakdown of skin: Secondary | ICD-10-CM | POA: Diagnosis present

## 2018-02-20 DIAGNOSIS — E1169 Type 2 diabetes mellitus with other specified complication: Secondary | ICD-10-CM | POA: Diagnosis not present

## 2018-02-20 DIAGNOSIS — Z85828 Personal history of other malignant neoplasm of skin: Secondary | ICD-10-CM | POA: Diagnosis not present

## 2018-02-20 DIAGNOSIS — Z9119 Patient's noncompliance with other medical treatment and regimen: Secondary | ICD-10-CM | POA: Diagnosis not present

## 2018-02-20 DIAGNOSIS — N189 Chronic kidney disease, unspecified: Secondary | ICD-10-CM | POA: Diagnosis not present

## 2018-02-20 DIAGNOSIS — E1122 Type 2 diabetes mellitus with diabetic chronic kidney disease: Secondary | ICD-10-CM | POA: Insufficient documentation

## 2018-02-21 ENCOUNTER — Inpatient Hospital Stay: Payer: Medicare HMO

## 2018-02-21 ENCOUNTER — Inpatient Hospital Stay: Payer: Medicare HMO | Attending: Oncology

## 2018-02-21 DIAGNOSIS — E1122 Type 2 diabetes mellitus with diabetic chronic kidney disease: Secondary | ICD-10-CM | POA: Insufficient documentation

## 2018-02-21 DIAGNOSIS — F17210 Nicotine dependence, cigarettes, uncomplicated: Secondary | ICD-10-CM | POA: Insufficient documentation

## 2018-02-21 DIAGNOSIS — N189 Chronic kidney disease, unspecified: Secondary | ICD-10-CM | POA: Insufficient documentation

## 2018-02-21 DIAGNOSIS — Z923 Personal history of irradiation: Secondary | ICD-10-CM | POA: Insufficient documentation

## 2018-02-21 DIAGNOSIS — D472 Monoclonal gammopathy: Secondary | ICD-10-CM | POA: Diagnosis present

## 2018-02-21 DIAGNOSIS — Z79899 Other long term (current) drug therapy: Secondary | ICD-10-CM | POA: Insufficient documentation

## 2018-02-21 DIAGNOSIS — Z85828 Personal history of other malignant neoplasm of skin: Secondary | ICD-10-CM | POA: Insufficient documentation

## 2018-02-21 DIAGNOSIS — K7460 Unspecified cirrhosis of liver: Secondary | ICD-10-CM | POA: Insufficient documentation

## 2018-02-21 DIAGNOSIS — I130 Hypertensive heart and chronic kidney disease with heart failure and stage 1 through stage 4 chronic kidney disease, or unspecified chronic kidney disease: Secondary | ICD-10-CM | POA: Diagnosis not present

## 2018-02-21 DIAGNOSIS — D631 Anemia in chronic kidney disease: Secondary | ICD-10-CM | POA: Insufficient documentation

## 2018-02-21 LAB — CBC
HCT: 27.2 % — ABNORMAL LOW (ref 39.0–52.0)
Hemoglobin: 8.8 g/dL — ABNORMAL LOW (ref 13.0–17.0)
MCH: 29.3 pg (ref 26.0–34.0)
MCHC: 32.4 g/dL (ref 30.0–36.0)
MCV: 90.7 fL (ref 80.0–100.0)
PLATELETS: 165 10*3/uL (ref 150–400)
RBC: 3 MIL/uL — AB (ref 4.22–5.81)
RDW: 14.7 % (ref 11.5–15.5)
WBC: 8.7 10*3/uL (ref 4.0–10.5)
nRBC: 0 % (ref 0.0–0.2)

## 2018-02-22 NOTE — Progress Notes (Signed)
DAQUANE, AGUILAR ([id number]) Visit Report for 02/20/2018 Arrival Information Details Patient Name: [name], [name] Date of Service: 02/20/2018 8:15 AM Medical Record Number: [id number] Patient Account Number: [id number] Date of Birth/Sex: 1951-02-10 (67 y.o. M) Treating RN: [name] Primary Care [name]: [name] Other Clinician: Referring [name]: [name] Treating [name]/Extender: [name], [name] in Treatment: 63 Visit Information History Since Last Visit Added or deleted any medications: No Patient Arrived: Ambulatory Any new allergies or adverse reactions: No Arrival Time: 08:20 Had a fall or experienced change in No Accompanied By: brother in activities of daily living that may affect law risk of falls: Transfer Assistance: None Signs or symptoms of abuse/neglect since last visito No Patient Identification Verified: Yes Hospitalized since last visit: No Secondary Verification Process Completed: Yes Implantable device outside of the clinic excluding No Patient Requires Transmission-Based No cellular tissue based products placed in the center Precautions: since last visit: Patient Has Alerts: Yes Has Dressing in Place as Prescribed: Yes Patient Alerts: DMII Pain Present Now: No Electronic Signature(s) Signed: 02/20/2018 2:48:14 PM By: [name] RCP, RRT, CHT Entered By: [name], [name] on 02/20/2018 08:21:51 [name], [name] ([id number]) -------------------------------------------------------------------------------- Clinic Level of Care Assessment Details Patient Name: [name], [name] Date of Service: 02/20/2018 8:15 AM Medical Record Number: [id number] Patient Account Number: [id number] Date of Birth/Sex: 1951/01/04 (67 y.o. M) Treating RN: [name] Primary Care [name]: [name] Other Clinician: Referring [name]: [name] Treating [name]/Extender: [name], [name] in Treatment: 73 Clinic  Level of Care Assessment Items TOOL 4 Quantity Score []  - Use when only an EandM is performed on FOLLOW-UP visit 0 ASSESSMENTS - Nursing Assessment / Reassessment X - Reassessment of Co-morbidities (includes updates in patient status) 1 10 X- 1 5 Reassessment of Adherence to Treatment Plan ASSESSMENTS - Wound and Skin Assessment / Reassessment []  - Simple Wound Assessment / Reassessment - one wound 0 X- 6 5 Complex Wound Assessment / Reassessment - multiple wounds []  - 0 Dermatologic / Skin Assessment (not related to wound area) ASSESSMENTS - Focused Assessment []  - Circumferential Edema Measurements - multi extremities 0 []  - 0 Nutritional Assessment / Counseling / Intervention X- 1 5 Lower Extremity Assessment (monofilament, tuning fork, pulses) []  - 0 Peripheral Arterial Disease Assessment (using hand held doppler) ASSESSMENTS - Ostomy and/or Continence Assessment and Care []  - Incontinence Assessment and Management 0 []  - 0 Ostomy Care Assessment and Management (repouching, etc.) PROCESS - Coordination of Care X - Simple Patient / Family Education for ongoing care 1 15 []  - 0 Complex (extensive) Patient / Family Education for ongoing care []  - 0 Staff obtains Programmer, systems, Records, Test Results / Process Orders []  - 0 Staff telephones HHA, Nursing Homes / Clarify orders / etc []  - 0 Routine Transfer to another Facility (non-emergent condition) []  - 0 Routine Hospital Admission (non-emergent condition) []  - 0 New Admissions / Biomedical engineer / Ordering NPWT, Apligraf, etc. []  - 0 Emergency Hospital Admission (emergent condition) X- 1 10 Simple Discharge Coordination [name], [name] ([id number]) []  - 0 Complex (extensive) Discharge Coordination PROCESS - Special Needs []  - Pediatric / Minor Patient Management 0 []  - 0 Isolation Patient Management []  - 0 Hearing / Language / Visual special needs []  - 0 Assessment of Community assistance (transportation, D/C  planning, etc.) []  - 0 Additional assistance / Altered mentation []  - 0 Support Surface(s) Assessment (bed, cushion, seat, etc.) INTERVENTIONS - Wound Cleansing / Measurement []  - Simple Wound Cleansing -  one wound 0 X- 6 5 Complex Wound Cleansing - multiple wounds X- 1 5 Wound Imaging (photographs - any number of wounds) []  - 0 Wound Tracing (instead of photographs) []  - 0 Simple Wound Measurement - one wound X- 6 5 Complex Wound Measurement - multiple wounds INTERVENTIONS - Wound Dressings X - Small Wound Dressing one or multiple wounds 6 10 []  - 0 Medium Wound Dressing one or multiple wounds []  - 0 Large Wound Dressing one or multiple wounds []  - 0 Application of Medications - topical []  - 0 Application of Medications - injection INTERVENTIONS - Miscellaneous []  - External ear exam 0 []  - 0 Specimen Collection (cultures, biopsies, blood, body fluids, etc.) []  - 0 Specimen(s) / Culture(s) sent or taken to Lab for analysis []  - 0 Patient Transfer (multiple staff / Civil Service fast streamer / Similar devices) []  - 0 Simple Staple / Suture removal (25 or less) []  - 0 Complex Staple / Suture removal (26 or more) []  - 0 Hypo / Hyperglycemic Management (close monitor of Blood Glucose) []  - 0 Ankle / Brachial Index (ABI) - do not check if billed separately X- 1 5 Vital Signs [name], [name] ([id number]) Has the patient been seen at the hospital within the last three years: Yes Total Score: 205 Level Of Care: ____ Electronic Signature(s) Signed: 02/20/2018 5:19:23 PM By: [name] Entered By: [name] on 02/20/2018 09:45:11 [name], [name] ([id number]) -------------------------------------------------------------------------------- Encounter Discharge Information Details Patient Name: [name], [name] Date of Service: 02/20/2018 8:15 AM Medical Record Number: [id number] Patient Account Number: [id number] Date of Birth/Sex: 1950-07-31 (67 y.o. M) Treating RN: [name] Primary Care [name]: [name] Other Clinician: Referring [name]: [name] Treating [name]/Extender: [name], [name] in Treatment: 63 Encounter Discharge Information Items Discharge Condition: Stable Ambulatory Status: Ambulatory Discharge Destination: Home Transportation: Private Auto Accompanied By: brother in law Schedule Follow-up Appointment: Yes Clinical Summary of Care: Electronic Signature(s) Signed: 02/20/2018 9:46:33 AM By: [name] Entered By: [name] on 02/20/2018 09:46:33 Azzaro, [name] ([id number]) -------------------------------------------------------------------------------- Lower Extremity Assessment Details Patient Name: [name], [name] Date of Service: 02/20/2018 8:15 AM Medical Record Number: [id number] Patient Account Number: [id number] Date of Birth/Sex: January 14, 1951 (67 y.o. M) Treating RN: [name] Primary Care Shanik Brookshire: [name] Other Clinician: Referring Darrius Montano: [name] Treating Oluwadamilola Deliz/Extender: [name], [name] in Treatment: 75 Vascular Assessment Pulses: Dorsalis Pedis Palpable: [Left:Yes] [Right:Yes] Posterior Tibial Extremity colors, hair growth, and conditions: Extremity Color: [Left:Hyperpigmented] [Right:Hyperpigmented] Hair Growth on Extremity: [Left:No] [Right:No] Temperature of Extremity: [Left:Warm] [Right:Warm] Capillary Refill: [Left:< 3 seconds] [Right:< 3 seconds] Toe Nail Assessment Left: Right: Thick: Yes Yes Discolored: Yes Yes Deformed: Yes Yes Improper Length and Hygiene: No No Electronic Signature(s) Signed: 02/20/2018 5:19:23 PM By: [name] Entered By: [name] on 02/20/2018 08:34:55 Dols, Ebbie E. ([id number]) -------------------------------------------------------------------------------- Multi Wound Chart Details Patient Name: [name], [name] Date of Service: 02/20/2018 8:15 AM Medical Record Number: [id number] Patient Account Number:  [id number] Date of Birth/Sex: 08/23/1950 (67 y.o. M) Treating RN: [name] Primary Care Freemon Binford: [name] Other Clinician: Referring Savanah Bayles: [name] Treating Doxie Augenstein/Extender: [name], [name] in Treatment: 38 Vital Signs Height(in): 69 Pulse(bpm): 39 Weight(lbs): 168 Blood Pressure(mmHg): 172/88 Body Mass Index(BMI): 25 Temperature(F): 98.0 Respiratory Rate 16 (breaths/min): Photos: Wound Location: Right Toe Great Left Toe Second Right Toe Great - Dorsal Wounding Event: Gradually Appeared Gradually Appeared Not Known Primary Etiology: Diabetic Wound/Ulcer of 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 Lower  Extremity Lower Extremity Comorbid History: N/A Anemia, Lymphedema, Anemia, Lymphedema, Congestive Heart Failure, Congestive Heart Failure, Hypertension, Peripheral Hypertension, Peripheral Venous Disease, Type II Venous Disease, Type II Diabetes, Neuropathy Diabetes, Neuropathy Date Acquired: 06/11/2016 09/26/2017 10/29/2017 [name] of Treatment: 73 21 16 Wound Status: Healed - Epithelialized Open Open Pending Amputation on Yes No No Presentation: Measurements L x W x D 0x0x0 0.1x0.1x0.1 0.5x0.6x0.1 (cm) Area (cm) : 0 0.008 0.236 Volume (cm) : 0 0.001 0.024 % Reduction in Area: 100.00% 99.70% 85.30% % Reduction in Volume: 100.00% 99.60% 85.00% Classification: Grade 2 Grade 1 Grade 1 Exudate Amount: N/A Medium Medium Exudate Type: N/A Serous Serous Exudate Color: N/A amber amber Wound Margin: N/A Flat and Intact Flat and Intact Granulation Amount: N/A Large (67-100%) Large (67-100%) Granulation Quality: N/A Pink Red Necrotic Amount: N/A Small (1-33%) Small (1-33%) Epithelialization: N/A Large (67-100%) None [name], [name] ([id number]) Periwound Skin Texture: No Abnormalities Noted Excoriation: No Excoriation: No Induration: No Induration: No Callus: No Callus: No Crepitus: No Crepitus: No Rash:  No Rash: No Scarring: No Scarring: No Periwound Skin Moisture: No Abnormalities Noted Maceration: No Maceration: No Dry/Scaly: No Dry/Scaly: No Periwound Skin Color: No Abnormalities Noted Atrophie Blanche: No Atrophie Blanche: No Cyanosis: No Cyanosis: No Ecchymosis: No Ecchymosis: No Erythema: No Erythema: No Hemosiderin Staining: No Hemosiderin Staining: No Mottled: No Mottled: No Pallor: No Pallor: No Rubor: No Rubor: No Temperature: N/A N/A No Abnormality Tenderness on Palpation: No No No Wound Preparation: N/A Ulcer Cleansing: Ulcer Cleansing: Rinsed/Irrigated with Saline Rinsed/Irrigated with Saline Topical Anesthetic Applied: Topical Anesthetic Applied: None Other: lidocaine 4% Wound Number: 17 2 5  Photos: Wound Location: Right Metatarsal head second Right Toe Second Left Toe Great - Plantar, Distal Wounding Event: Trauma Gradually Appeared Gradually Appeared Primary Etiology: Diabetic Wound/Ulcer of 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 Lower Extremity Lower Extremity Comorbid History: Anemia, Lymphedema, Anemia, Lymphedema, Anemia, Lymphedema, Congestive Heart Failure, Congestive Heart Failure, Congestive Heart Failure, Hypertension, Peripheral Hypertension, Peripheral Hypertension, Peripheral Venous Disease, Type II Venous Disease, Type II Venous Disease, Type II Diabetes, Neuropathy Diabetes, Neuropathy Diabetes, Neuropathy Date Acquired: 02/03/2018 06/11/2016 06/11/2016 [name] of Treatment: 2 73 73 Wound Status: Open Open Open Pending Amputation on No Yes Yes Presentation: Measurements L x W x D 0.3x0.6x0.2 0.2x0.2x0.1 0.1x0.1x0.1 (cm) Area (cm) : 0.141 0.031 0.008 Volume (cm) : 0.028 0.003 0.001 % Reduction in Area: 60.10% 99.00% 99.90% % Reduction in Volume: 20.00% 99.00% 99.90% Classification: Grade 1 Grade 2 Grade 2 Exudate Amount: Medium Medium Medium Exudate Type: Serous Serous Serous Exudate Color:  amber amber amber [name], [name] ([id number]) Wound Margin: Flat and Intact Flat and Intact Flat and Intact Granulation Amount: Large (67-100%) Large (67-100%) Large (67-100%) Granulation Quality: Pink Pink Pink Necrotic Amount: None Present (0%) Small (1-33%) Small (1-33%) Exposed Structures: Fascia: No Fat Layer (Subcutaneous Fat Layer (Subcutaneous Fat Layer (Subcutaneous Tissue) Exposed: Yes Tissue) Exposed: Yes Tissue) Exposed: No Fascia: No Fascia: No Tendon: No Tendon: No Tendon: No Muscle: No Muscle: No Muscle: No Joint: No Joint: No Joint: No Bone: No Bone: No Bone: No Epithelialization: None None None Periwound Skin Texture: Excoriation: No Excoriation: No Excoriation: No Induration: No Induration: No Induration: No Callus: No Callus: No Callus: No Crepitus: No Crepitus: No Crepitus: No Rash: No Rash: No Rash: No Scarring: No Scarring: No Scarring: No Periwound Skin Moisture: Maceration: No Maceration: No Maceration: No Dry/Scaly: No Dry/Scaly: No Dry/Scaly: No Periwound Skin Color: Atrophie Blanche: No Atrophie Blanche: No Atrophie Blanche:  No Cyanosis: No Cyanosis: No Cyanosis: No Ecchymosis: No Ecchymosis: No Ecchymosis: No Erythema: No Erythema: No Erythema: No Hemosiderin Staining: No Hemosiderin Staining: No Hemosiderin Staining: No Mottled: No Mottled: No Mottled: No Pallor: No Pallor: No Pallor: No Rubor: No Rubor: No Rubor: No Temperature: No Abnormality No Abnormality N/A Tenderness on Palpation: No No No Wound Preparation: Ulcer Cleansing: Ulcer Cleansing: Ulcer Cleansing: Rinsed/Irrigated with Saline Rinsed/Irrigated with Saline Rinsed/Irrigated with Saline Topical Anesthetic Applied: Topical Anesthetic Applied: Topical Anesthetic Applied: None None None Treatment Notes Electronic Signature(s) Signed: 02/20/2018 5:19:23 PM By: [name] Entered By: [name] on 02/20/2018 09:09:45 [name], [name]  ([id number]) -------------------------------------------------------------------------------- [place] Details Patient Name: Roger, Christapher E. Date of Service: 02/20/2018 8:15 AM Medical Record Number: [id number] Patient Account Number: [id number] Date of Birth/Sex: 1950-04-28 (67 y.o. M) Treating RN: [name] Primary Care Makenlee Mckeag: [name] Other Clinician: Referring Betty Daidone: [name] Treating Norma Ignasiak/Extender: [name], [name] in Treatment: 33 Active Inactive ` Abuse / Safety / Falls / Self Care Management Nursing Diagnoses: Potential for falls Goals: Patient will remain injury free related to falls Date Initiated: 09/21/2016 Target Resolution Date: 09/14/2017 Goal Status: Active Interventions: Assess fall risk on admission and as needed Notes: ` Nutrition Nursing Diagnoses: Potential for alteratiion in Nutrition/Potential for imbalanced nutrition Goals: Patient/caregiver agrees to and verbalizes understanding of need to use nutritional supplements and/or vitamins as prescribed Date Initiated: 09/21/2016 Target Resolution Date: 09/14/2017 Goal Status: Active Interventions: Assess patient nutrition upon admission and as needed per policy Notes: ` Orientation to the Wound Care Program Nursing Diagnoses: Knowledge deficit related to the wound healing center program Goals: Patient/caregiver will verbalize understanding of the [place] Program Date Initiated: 09/21/2016 Target Resolution Date: 06/15/2017 Goal Status: Active Interventions: DARKCarrol, Bondar ([id number]) Provide education on orientation to the wound center Notes: ` Wound/Skin Impairment Nursing Diagnoses: Knowledge deficit related to smoking impact on wound healing Goals: Ulcer/skin breakdown will have a volume reduction of 30% by week 4 Date Initiated: 09/21/2016 Target Resolution Date: 08/17/2017 Goal Status: Active Ulcer/skin breakdown will have a volume  reduction of 50% by week 8 Date Initiated: 09/21/2016 Target Resolution Date: 08/17/2017 Goal Status: Active Ulcer/skin breakdown will have a volume reduction of 80% by week 12 Date Initiated: 09/21/2016 Target Resolution Date: 09/14/2017 Goal Status: Active Ulcer/skin breakdown will heal within 14 [name] Date Initiated: 09/21/2016 Target Resolution Date: 08/17/2017 Goal Status: Active Interventions: Assess patient/caregiver ability to obtain necessary supplies Assess patient/caregiver ability to perform ulcer/skin care regimen upon admission and as needed Assess ulceration(s) every visit Notes: Electronic Signature(s) Signed: 02/20/2018 5:19:23 PM By: [name] Entered By: [name] on 02/20/2018 09:09:38 [name], [name] ([id number]) -------------------------------------------------------------------------------- Pain Assessment Details Patient Name: Marty, Eyal E. Date of Service: 02/20/2018 8:15 AM Medical Record Number: [id number] Patient Account Number: [id number] Date of Birth/Sex: [birth year] (67 y.o. M) Treating RN: [name] Primary Care Jurell Basista: [name] Other Clinician: Referring Keilon Ressel: [name] Treating Mirna Sutcliffe/Extender: [name], [name] in Treatment: 77 Active Problems Location of Pain Severity and Description of Pain Patient Has Paino No Site Locations Pain Management and Medication Current Pain Management: Electronic Signature(s) Signed: 02/20/2018 2:48:14 PM By: Paulla Fore, RRT, CHT Signed: 02/20/2018 5:19:23 PM By: [name] Entered By: [name] on 02/20/2018 08:21:58 Leatherwood, [name] ([id number]) -------------------------------------------------------------------------------- Patient/Caregiver Education Details Patient Name: Kunzman, Deondrea E. Date of Service: 02/20/2018 8:15 AM Medical Record Number: [id number] Patient Account Number: [id number] Date of Birth/Gender: [birth year] (67 y.o.  M)  Treating RN: [name] Primary Care Physician: [name] Other Clinician: Referring Physician: Lamonte [name] Treating Physician/Extender: Sharalyn Ink in Treatment: 52 Education Assessment Education Provided To: Patient Education Topics Provided Wound/Skin Impairment: Handouts: Other: wound care as ordered and do not rub toes on the floor Methods: Demonstration, Explain/Verbal Responses: State content correctly Electronic Signature(s) Signed: 02/20/2018 5:19:23 PM By: [name] Entered By: [name] on 02/20/2018 09:45:47 Mullens, [name] ([id number]) -------------------------------------------------------------------------------- Wound Assessment Details Patient Name: Bhalla, Willies E. Date of Service: 02/20/2018 8:15 AM Medical Record Number: [id number] Patient Account Number: [id number] Date of Birth/Sex: 1951-02-04 (67 y.o. M) Treating RN: [name] Primary Care Oliviya Gilkison: [name] Other Clinician: Referring Hunner Garcon: [name] Treating Lien Lyman/Extender: [name], [name] in Treatment: 36 Wound Status Wound Number: 1 Primary Diabetic Wound/Ulcer of the Lower Etiology: Extremity Wound Location: Right Toe Great Wound Status: Healed - Epithelialized Wounding Event: Gradually Appeared Date Acquired: 06/11/2016 [name] Of Treatment: 73 Clustered Wound: No Pending Amputation On Presentation Photos Photo Uploaded By: [name] on 02/20/2018 09:03:33 Wound Measurements Length: (cm) 0 % Width: (cm) 0 % Depth: (cm) 0 Area: (cm) 0 Volume: (cm) 0 Reduction in Area: 100% Reduction in Volume: 100% Wound Description Classification: Grade 2 Periwound Skin Texture Texture Color No Abnormalities Noted: No No Abnormalities Noted: No Moisture No Abnormalities Noted: No Electronic Signature(s) Signed: 02/20/2018 5:19:23 PM By: [name] Entered By: [name] on 02/20/2018 08:29:03 Eisen, Jovane E.  ([id number]) -------------------------------------------------------------------------------- Wound Assessment Details Patient Name: Vane, Rosendo E. Date of Service: 02/20/2018 8:15 AM Medical Record Number: [id number] Patient Account Number: [id number] Date of Birth/Sex: [birth year] (67 y.o. M) Treating RN: [name] Primary Care Makayle Krahn: [name] Other Clinician: Referring Charrisse Masley: [name] Treating Abigayl Hor/Extender: [name], [name] in Treatment: 33 Wound Status Wound Number: 14 Primary Diabetic Wound/Ulcer of the Lower Extremity Etiology: Wound Location: Left Toe Second Wound Open Wounding Event: Gradually Appeared Status: Date Acquired: 09/26/2017 Comorbid Anemia, Lymphedema, Congestive Heart [name] Of Treatment: 21 History: Failure, Hypertension, Peripheral Venous Clustered Wound: No Disease, Type II Diabetes, Neuropathy Photos Photo Uploaded By: [name] on 02/20/2018 09:03:34 Wound Measurements Length: (cm) 0.1 Width: (cm) 0.1 Depth: (cm) 0.1 Area: (cm) 0.008 Volume: (cm) 0.001 % Reduction in Area: 99.7% % Reduction in Volume: 99.6% Epithelialization: Large (67-100%) Tunneling: No Undermining: No Wound Description Classification: Grade 1 Wound Margin: Flat and Intact Exudate Amount: Medium Exudate Type: Serous Exudate Color: amber Foul Odor After Cleansing: No Slough/Fibrino Yes Wound Bed Granulation Amount: Large (67-100%) Exposed Structure Granulation Quality: Pink Fascia Exposed: No Necrotic Amount: Small (1-33%) Fat Layer (Subcutaneous Tissue) Exposed: Yes Necrotic Quality: Adherent Slough Tendon Exposed: No Muscle Exposed: No Joint Exposed: No Bone Exposed: No Periwound Skin Texture Coole, Doye E. ([id number]) Texture Color No Abnormalities Noted: No No Abnormalities Noted: No Callus: No Atrophie Blanche: No Crepitus: No Cyanosis: No Excoriation: No Ecchymosis: No Induration: No Erythema: No Rash:  No Hemosiderin Staining: No Scarring: No Mottled: No Pallor: No Moisture Rubor: No No Abnormalities Noted: No Dry / Scaly: No Maceration: No Wound Preparation Ulcer Cleansing: Rinsed/Irrigated with Saline Topical Anesthetic Applied: None Treatment Notes Wound #14 (Left Toe Second) Notes silvercel secured with tape Electronic Signature(s) Signed: 02/20/2018 5:19:23 PM By: [name] Entered By: [name] on 02/20/2018 08:33:31 Slayden, [name] ([id number]) -------------------------------------------------------------------------------- Wound Assessment Details Patient Name: Bookbinder, Carman E. Date of Service: 02/20/2018 8:15 AM Medical Record Number: [id number] Patient Account Number: [id number] Date of Birth/Sex: Jun 26, 1950 (67 y.o. M) Treating RN: [name]  Primary Care Yi [name]: [name] Other Clinician: Referring Holy Battenfield: [name] Treating Pierrette Scheu/Extender: [name], [name] in Treatment: 73 Wound Status Wound Number: 16 Primary Diabetic Wound/Ulcer of the Lower Extremity Etiology: Wound Location: Right Toe Great - Dorsal Wound Open Wounding Event: Not Known Status: Date Acquired: 10/29/2017 Comorbid Anemia, Lymphedema, Congestive Heart [name] Of Treatment: 16 History: Failure, Hypertension, Peripheral Venous Clustered Wound: No Disease, Type II Diabetes, Neuropathy Photos Photo Uploaded By: [name] on 02/20/2018 09:04:03 Wound Measurements Length: (cm) 0.5 Width: (cm) 0.6 Depth: (cm) 0.1 Area: (cm) 0.236 Volume: (cm) 0.024 % Reduction in Area: 85.3% % Reduction in Volume: 85% Epithelialization: None Tunneling: No Undermining: No Wound Description Classification: Grade 1 Wound Margin: Flat and Intact Exudate Amount: Medium Exudate Type: Serous Exudate Color: amber Foul Odor After Cleansing: No Slough/Fibrino Yes Wound Bed Granulation Amount: Large (67-100%) Exposed Structure Granulation Quality: Red Fascia Exposed:  No Necrotic Amount: Small (1-33%) Fat Layer (Subcutaneous Tissue) Exposed: Yes Necrotic Quality: Adherent Slough Tendon Exposed: No Muscle Exposed: No Joint Exposed: No Bone Exposed: No Periwound Skin Texture Micek, Genevieve E. ([id number]) Texture Color No Abnormalities Noted: No No Abnormalities Noted: No Callus: No Atrophie Blanche: No Crepitus: No Cyanosis: No Excoriation: No Ecchymosis: No Induration: No Erythema: No Rash: No Hemosiderin Staining: No Scarring: No Mottled: No Pallor: No Moisture Rubor: No No Abnormalities Noted: No Dry / Scaly: No Temperature / Pain Maceration: No Temperature: No Abnormality Wound Preparation Ulcer Cleansing: Rinsed/Irrigated with Saline Topical Anesthetic Applied: Other: lidocaine 4%, Treatment Notes Wound #16 (Right, Dorsal Toe Great) Notes silvercel secured with tape Electronic Signature(s) Signed: 02/20/2018 5:19:23 PM By: [name] Entered By: [name] on 02/20/2018 08:33:45 [place], [name] ([id number]) -------------------------------------------------------------------------------- Wound Assessment Details Patient Name: Heatley, Quinterius E. Date of Service: 02/20/2018 8:15 AM Medical Record Number: [id number] Patient Account Number: [id number] Date of Birth/Sex: Oct 07, 1950 (67 y.o. M) Treating RN: [name] Primary Care Ezme Duch: [name] Other Clinician: Referring Darbie Biancardi: [name] Treating Libia Fazzini/Extender: [name], [name] in Treatment: 34 Wound Status Wound Number: 17 Primary Diabetic Wound/Ulcer of the Lower Extremity Etiology: Wound Location: Right Metatarsal head second - Plantar, Distal Wound Open Status: Wounding Event: Trauma Comorbid Anemia, Lymphedema, Congestive Heart Date Acquired: 02/03/2018 History: Failure, Hypertension, Peripheral Venous [name] Of Treatment: 2 Disease, Type II Diabetes, Neuropathy Clustered Wound: No Photos Photo Uploaded By: [name] on  02/20/2018 09:04:04 Wound Measurements Length: (cm) 0.3 Width: (cm) 0.6 Depth: (cm) 0.2 Area: (cm) 0.141 Volume: (cm) 0.028 % Reduction in Area: 60.1% % Reduction in Volume: 20% Epithelialization: None Tunneling: No Undermining: No Wound Description Classification: Grade 1 Wound Margin: Flat and Intact Exudate Amount: Medium Exudate Type: Serous Exudate Color: amber Foul Odor After Cleansing: No Slough/Fibrino No Wound Bed Granulation Amount: Large (67-100%) Exposed Structure Granulation Quality: Pink Fascia Exposed: No Necrotic Amount: None Present (0%) Fat Layer (Subcutaneous Tissue) Exposed: No Tendon Exposed: No Muscle Exposed: No Joint Exposed: No Bone Exposed: No Periwound Skin Texture Mullins, Arrow E. ([id number]) Texture Color No Abnormalities Noted: No No Abnormalities Noted: No Callus: No Atrophie Blanche: No Crepitus: No Cyanosis: No Excoriation: No Ecchymosis: No Induration: No Erythema: No Rash: No Hemosiderin Staining: No Scarring: No Mottled: No Pallor: No Moisture Rubor: No No Abnormalities Noted: No Dry / Scaly: No Temperature / Pain Maceration: No Temperature: No Abnormality Wound Preparation Ulcer Cleansing: Rinsed/Irrigated with Saline Topical Anesthetic Applied: None Treatment Notes Wound #17 (Right, Distal, Plantar Metatarsal head second) Notes silvercel secured with tape Electronic Signature(s) Signed: 02/20/2018 5:19:23 PM  By: [name] Entered By: [name] on 02/20/2018 08:33:57 Milian, [name] ([id number]) -------------------------------------------------------------------------------- Wound Assessment Details Patient Name: Ardoin, Ricci E. Date of Service: 02/20/2018 8:15 AM Medical Record Number: [id number] Patient Account Number: [id number] Date of Birth/Sex: Sep 16, 1950 (67 y.o. M) Treating RN: [name] Primary Care Jameis Newsham: [name] Other Clinician: Referring Shermaine Brigham: [name] Treating  Stuart Mirabile/Extender: [name], [name] in Treatment: 20 Wound Status Wound Number: 2 Primary Diabetic Wound/Ulcer of the Lower Extremity Etiology: Wound Location: Right Toe Second Wound Open Wounding Event: Gradually Appeared Status: Date Acquired: 06/11/2016 Comorbid Anemia, Lymphedema, Congestive Heart [name] Of Treatment: 73 History: Failure, Hypertension, Peripheral Venous Clustered Wound: No Disease, Type II Diabetes, Neuropathy Pending Amputation On Presentation Photos Photo Uploaded By: [name] on 02/20/2018 09:04:29 Wound Measurements Length: (cm) 0.2 Width: (cm) 0.2 Depth: (cm) 0.1 Area: (cm) 0.031 Volume: (cm) 0.003 % Reduction in Area: 99% % Reduction in Volume: 99% Epithelialization: None Tunneling: No Undermining: No Wound Description Classification: Grade 2 Wound Margin: Flat and Intact Exudate Amount: Medium Exudate Type: Serous Exudate Color: amber Foul Odor After Cleansing: No Slough/Fibrino Yes Wound Bed Granulation Amount: Large (67-100%) Exposed Structure Granulation Quality: Pink Fascia Exposed: No Necrotic Amount: Small (1-33%) Fat Layer (Subcutaneous Tissue) Exposed: Yes Necrotic Quality: Adherent Slough Tendon Exposed: No Muscle Exposed: No Joint Exposed: No Bone Exposed: No Periwound Skin Texture Mengel, Tahjay E. ([id number]) Texture Color No Abnormalities Noted: No No Abnormalities Noted: No Callus: No Atrophie Blanche: No Crepitus: No Cyanosis: No Excoriation: No Ecchymosis: No Induration: No Erythema: No Rash: No Hemosiderin Staining: No Scarring: No Mottled: No Pallor: No Moisture Rubor: No No Abnormalities Noted: No Dry / Scaly: No Temperature / Pain Maceration: No Temperature: No Abnormality Wound Preparation Ulcer Cleansing: Rinsed/Irrigated with Saline Topical Anesthetic Applied: None Treatment Notes Wound #2 (Right Toe Second) Notes silvercel secured with tape Electronic Signature(s) Signed:  02/20/2018 5:19:23 PM By: [name] Entered By: [name] on 02/20/2018 08:34:09 Wentling, Kara EMarland Kitchen ([id number]) -------------------------------------------------------------------------------- Wound Assessment Details Patient Name: Edison, Orby E. Date of Service: 02/20/2018 8:15 AM Medical Record Number: [id number] Patient Account Number: [id number] Date of Birth/Sex: 06-02-1950 (67 y.o. M) Treating RN: [name] Primary Care Jecenia Leamer: [name] Other Clinician: Referring Pleshette Tomasini: [name] Treating TRUE Garciamartinez/Extender: [name], [name] in Treatment: 24 Wound Status Wound Number: 5 Primary Diabetic Wound/Ulcer of the Lower Extremity Etiology: Wound Location: Left Toe Great Wound Open Wounding Event: Gradually Appeared Status: Date Acquired: 06/11/2016 Comorbid Anemia, Lymphedema, Congestive Heart [name] Of Treatment: 73 History: Failure, Hypertension, Peripheral Venous Clustered Wound: No Disease, Type II Diabetes, Neuropathy Pending Amputation On Presentation Photos Photo Uploaded By: [name] on 02/20/2018 09:04:30 Wound Measurements Length: (cm) 0.1 Width: (cm) 0.1 Depth: (cm) 0.1 Area: (cm) 0.008 Volume: (cm) 0.001 % Reduction in Area: 99.9% % Reduction in Volume: 99.9% Epithelialization: None Tunneling: No Undermining: No Wound Description Classification: Grade 2 Wound Margin: Flat and Intact Exudate Amount: Medium Exudate Type: Serous Exudate Color: amber Foul Odor After Cleansing: No Slough/Fibrino Yes Wound Bed Granulation Amount: Large (67-100%) Exposed Structure Granulation Quality: Pink Fascia Exposed: No Necrotic Amount: Small (1-33%) Fat Layer (Subcutaneous Tissue) Exposed: Yes Necrotic Quality: Adherent Slough Tendon Exposed: No Muscle Exposed: No Joint Exposed: No Bone Exposed: No Periwound Skin Texture Tierno, Ladarian E. ([id number]) Texture Color No Abnormalities Noted: No No Abnormalities Noted: No Callus:  No Atrophie Blanche: No Crepitus: No Cyanosis: No Excoriation: No Ecchymosis: No Induration: No Erythema: No Rash: No Hemosiderin Staining: No Scarring: No Mottled: No Pallor:  No Moisture Rubor: No No Abnormalities Noted: No Dry / Scaly: No Maceration: No Wound Preparation Ulcer Cleansing: Rinsed/Irrigated with Saline Topical Anesthetic Applied: None Treatment Notes Wound #5 (Left Toe Great) Notes silvercel secured with tape Electronic Signature(s) Signed: 02/20/2018 5:19:23 PM By: [name] Entered By: [name] on 02/20/2018 08:34:25 Juncaj, [name] ([id number]) -------------------------------------------------------------------------------- Vitals Details Patient Name: Chaffin, Detric E. Date of Service: 02/20/2018 8:15 AM Medical Record Number: [id number] Patient Account Number: [id number] Date of Birth/Sex: [birth year] (67 y.o. M) Treating RN: [name] Primary Care Bettylou Frew: [name] Other Clinician: Referring Astin Sayre: [name] Treating Estephanie Hubbs/Extender: [name], [name] in Treatment: 12 Vital Signs Time Taken: 08:21 Temperature (F): 98.0 Height (in): 69 Pulse (bpm): 65 Weight (lbs): 168 Respiratory Rate (breaths/min): 16 Body Mass Index (BMI): 24.8 Blood Pressure (mmHg): 172/88 Reference Range: 80 - 120 mg / dl Electronic Signature(s) Signed: 02/20/2018 2:48:14 PM By: [name] RCP, RRT, CHT Entered By: [name] on 02/20/2018 08:24:36

## 2018-02-22 NOTE — Progress Notes (Signed)
MARQUIE, ADERHOLD ([id number]) Visit Report for 02/20/2018 Chief Complaint Document Details Patient Name: [name], [name] Date of Service: 02/20/2018 8:15 AM Medical Record Number: [id number] Patient Account Number: [id number] Date of Birth/Sex: 1950/12/07 (67 y.o. M) Treating RN: Montey Hora Primary Care Provider: Lamonte Sakai Other Clinician: Referring Provider: Lamonte Sakai Treating Provider/Extender: Melburn Hake, HOYT Weeks in Treatment: 44 Information Obtained from: Patient Chief Complaint Patient presents for treatment of an open diabetic ulcer to both feet Electronic Signature(s) Signed: 02/20/2018 5:15:19 PM By: Worthy Keeler PA-C Entered By: Worthy Keeler on 02/20/2018 08:26:40 [name], [name] ([id number]) -------------------------------------------------------------------------------- HPI Details Patient Name: [name], [name] Date of Service: 02/20/2018 8:15 AM Medical Record Number: [id number] Patient Account Number: [id number] Date of Birth/Sex: 08/25/1950 (66 y.o. M) Treating RN: Montey Hora Primary Care Provider: Lamonte Sakai Other Clinician: Referring Provider: Lamonte Sakai Treating Provider/Extender: Melburn Hake, HOYT Weeks in Treatment: 15 History of Present Illness Location: bilateral feet ulceration on the toes Quality: Patient reports experiencing a dull pain to affected area(s). Severity: Patient states wound are getting better Duration: Patient has had the wound for > 3 months prior to seeking treatment at the wound center Timing: Pain in wound is constant (hurts all the time) Context: The wound would happen gradually Modifying Factors: Other treatment(s) tried include:treatment for lymphedema and is seen by the podiatrist Dr. Caryl Comes Associated Signs and Symptoms: Patient reports having increase swelling. HPI Description: 67 year old patient here to see as for bilateral feet ulceration to on his left first and second toe and 2 on his right first and second  toe, which she's had for about 4 months. 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 Donella Stade. The patient is also undergoing workup by medical oncology for a intra-abdominal lymphadenopathy. 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 He is also status post appendectomy, inguinal lymph node biopsy, rectal biopsy and rectal examination under anesthesia. he currently smokes cigarettes about half packet a day. In March of this year he was seen by Dr. Hortencia Pilar, for evaluation of bilateral varicose veins and besides wearing compression stockings he had recommended laser ablation of the right and left great saphenous veins to eleviate the symptoms and complications of severe superficial venous reflux disease. He also recommended lymphedema pumps for better control of his lymphedema. The patient recently has had on 08/23/2016, right greater saphenous vein ablation with the laser energy Earlier lower extremity venous reflux examination done on 05/08/2016 showed no DVT or SVT both lower legs but incompetence of bilateral great saphenous veins was present. A lower arterial study was also done and there was no significant right lower and left lower extremity problems based on a normal toe brachial index bilaterally and the ABI was 1.21 the left and 1.23 on the right. His post ablation venous duplex examination showed successful ablation of the right GS vein with thrombus formation 2 below the right saphenofemoral junction. The deep system was patent without evidence of thrombosis and this was done on 08/30/2016. the patient also has a squamous cell cancer of the skin of the buttock and is recently undergone radiation therapy for this prior to excisional  surgery. Addendum: regarding his x-rays done today and x-ray of the left foot -- IMPRESSION: No objective evidence of osteomyelitis. There  are soft tissue changes which may reflect cellulitis. X-ray of the right foot -- IMPRESSION:Findings compatible with cellulitis of the toes. No objective evidence of osteomyelitis is observed. 10/01/16 on evaluation today patient's wounds appeared to be doing some better. I did review the x-rays as well which showed no evidence of osteomyelitis although there was evidence on x-ray of cellulitis. He fortunately is not having any discomfort although he continues to have some swelling. He does not remember being on any antibiotics recently. 10/15/16 on evaluation today patient's wounds overall appear to be doing better although he does have a new location noted on the left foot. Fortunately he is not having significant pain. It almost has the appearance that something is rubbing on the end of his toes but he wears the open toe shoes and according to what he is telling me never wears anything that would rub on his foot. There is no evidence of infection and specifically no evidence of a fungal infection 10/22/16 On evaluation today patient's wounds appeared to be doing better compared to last week in regard to his bilateral [name], [name] ([id number]) lower extremities. Fortunately I happy with how things are progressing although he still has ulcers I feel like that he is improving and appropriate manner. 11/12/16 on evaluation today patient appears to be doing well in regard to his bilateral feet and the respective wounds. We have been using surrounding her dressings along with an antifungal cream which seems to be doing very well. He has no bilateral dysfunction noticed that the rituals are weight loss at this point. He also has no nausea or vomiting a note purulent discharge. He did see Vein and vascular today and he tells me that they told him he could have surgery  for his venous stasis but they did not feel like it was worth it in his words. Fortunately patient's wounds do appear to be getting sneakily better. 11/26/2016 -- he says he is going to have some surgery during this week at [facility] for possibly a colon resection. 12/31/2016 -- the patient has been noncompliant with his smoking and I'm not sure whether he is also started drinking again. He continues to be very nonchalant about his care 01/14/2017 -- the patient's HandP has been reviewed well and I understand he is being compliant with trying to give up smoking and his local dressing changes. He does not have any surgical options of 4 to him by his vascular surgeons.he was last seen in early August by Dr. Hortencia Pilar who recommended compression stockings,and possibly lymph pumps in 2-3 months after doing a review ultrasound. 01/28/2017 - the patient did not have any fresh complaints but on examination I noted a large lacerated wound on the plantar aspect of his right fourth toe which had a lot of necrotic debris and it probes down to bone. 02/07/2017 -- x-ray of the right foot -- IMPRESSION: Soft tissue swelling about the first through fourth toes consistent with cellulitis. New destructive change in the tuft of the distal phalanx of the great toe is consistent with osteomyelitis. 02/14/2017 -- the patient's MRI is pending this coming Monday and he still continues to smoke. We have again gone over off loading of his wounds in great detail and he says he's been compliant. 02/21/2017 -- MR of the right foot -- IMPRESSION: 1. Soft tissue ulcer at the tip of the first, second and third toe knows. Cortical irregularity and bone marrow edema in the first distal phalanx most concerning  for osteomyelitis. Mild marrow edema in the second and third distal phalanx without definite cortical destruction which may reflect early osteomyelitis versus reactive marrow edema. 2. Soft tissue edema surrounding  the first phalanx most consistent with cellulitis. the patient was also recently evaluated by his medical oncologist Dr. 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 She is treating him with weekly Procrit. She is also keeping intra-abdominal lymphadenopathy and right lower lobe lung nodule under observation. 04/04/2017 -- he was seen by Dr. Adrian Prows on 03/25/2017 -- after review he empirically put him on ciprofloxacin and doxycycline as they have good bone penetration and good bioavailability and it will cover the usual pathogens and diabetic foot osteomyelitis. He will check inflammatory markers and plan a 43-45 week old records. C-reactive protein was 0.3 and the ESR was 72 04/18/17 on evaluation today patient appears to be doing about the same in regard to his lower extremity wounds bilaterally. He has continued to use the antifungal cream which does seem to be beneficial. Nonetheless the ulcers do seem to in some areas be epithelial eyes over and in other areas are still open. He is having no significant discomfort. 04/25/17-he is here in follow-up evaluation for multiple ulcerations to multiple toes bilaterally. He states he did see Dr. Ola Spurr again last week and continues antibiotic therapy. He is voicing no complaints or concerns, will continue with current treatment plan will possibility of adding compression therapy next week after an additional week of treatment/lotions to BLE prescribed by Dr Ola Spurr 05/02/17 he is here in follow up for for multiple ulcers to multiple toes bilaterally. we will stop using antifungal cream and will continue with silvercel and follow up next week 05/09/17-he is here in follow-up for multiple ulcerations to multiple toes bilaterally. There is improvement in appearance. He has not completely stopped using antifungal cream, but admits he has not using it between the toes. He has an  appointment with Dr. Ola Spurr on 2/11, continues on doxycycline and Cipro. It has been 5 weeks of antibiotic therapy, we will order plain film xray to evaluate for osteomyelitis next week, prior to follow up with ID. Will continue with silvercel and follow up next week 05/16/17-he is here in follow-up evaluation for multiple ulcerations to multiple toes bilaterally and new wound to the right posterior heel. There is essentially no change in appearance, deteriorating measurements; he has a history of waxing and waning measurements. He admits that he continues to apply moisturizer/cream/ointment to his toes despite weekly reminders to only apply silvercel to his toes. He states that he thinks the surgical shoe contributed to the superficial ulcer to his posterior [name], [name] ([id number]) heel, he is unable to articulate if this was an area of dry cracked skin as he has a similar area to the left heel. He now is wearing open toed slippers. He has an appointment with Dr. Ola Spurr on 2/11. We have ordered x-rays for her bilateral feet; he was advised to obtain the x-rays today or tomorrow. He will follow-up next week 05/23/17-he is here in follow-up evaluation for multiple ulcerations to multiple toes bilaterally and the right posterior heel. There is improvement in maceration. He has been compliant and not applying any moisturizing agent to his toes. He has been using Lac-Hydrin for his lower extremities with improvement. He did not go to his appointment on Monday with Dr. Ola Spurr secondary to financial concerns. X-rays for her bilateral feet  showed: LEFT FOOT with slight erosion of the tuft of the distal phalanges of the left first and second toe suspicious for osteomyelitis, RIGHT FOOT with 1.erosion of the tufts of the distal phalanges of the right first second and possibly third toes consistent with osteomyelitis, 2 no definitive abnormality of the calcaneus is seen on the images obtained, 3.  Plantar calcaneal degenerative spur. We briefly discussed hyperbaric adjunctive therapy for treatment of chronic refractory osteomyelitis. I do not find an a1c in EMR, will contact PCP for record, or order if needed. He has been encouraged to contact Dr Ola Spurr office regarding the follow-up appointment, encouraged him to inquire about payment plan. We will continue with same treatment plan and follow-up next week. He states he is still taking antibiotics and has "a lot" left. He states he has been taking them as directed, 2 pills twice daily. According to Dr. Blane Ohara office notes he was originally started on 12/17 for 4 weeks and extended on 1/14 for an additional 4 weeks. He should be done with his antibiotic therapy, he was advised to bring his bottles and to his next appointment, we will contact pharmacy. 05/30/17-he is here in follow-up evaluation for multiple ulcerations to multiple toes bilaterally and the right posterior heel. He is accompanied by his brother-in-law. Wounds are stable. He has yet to make up with Dr. Ola Spurr. We contacted his PCP, with no record of recent A1c we will draw an A1c. His brother-in-law states that he was taken off all of his diabetic medication secondary to kidney function. 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 The antibiotics were counted and have approximately 2 weeks left, although they should be complete. The pill organizer reveals missing days. We discussed the need for consistent medications, to have optimal benefit of medication. He has a cousin that lives with him and he will ask her to check his organizer daily. He has been advised to follow up with Dr Ola Spurr, and will go by the office today. He has been advised to quit smoking. 06/06/17-he is here in follow up evaluation. He has had to make an appointment with Dr. Ola Spurr. He did have  blood work obtained, a1c 5. He continues to take antibiotic therapy. Significant improvement in bilateral lower extremity edema with compression therapy. Essentially no change in ulcerations to toes. He states he is "going to try something different" and "let me know next week" if it works; he would not provide any additional information and was encouraged to follow our orders. We will follow up next week 06/13/17-he is here in follow-up evaluation. He has an appointment with Dr. Ola Spurr tomorrow morning. He states he purchased an ointment from [facility] and applied to his toes for 3 days, he does not remember the name of the ointment. There is improvement to his wounds, minimal maceration. He continues to take antibiotic therapy, this should have been completed last month. His brother-in-law who regularly accompanies his appointments was asked to take the bottles to the appointment tomorrow with Dr. Ola Spurr so he is aware. We will continue with 3 layer compression, and order OPEN TOE compression 20-30mmHg; we will apply compression stockings next week. He continues to smoke, smoked "2 cigarettes" last week 06/20/17 on evaluation today patient did receive his compression stockings which he has with him today for both lower extremities. With that being said he tells me at this point in time that he is very  happy to have these he really is not a big fan of the compression wraps that we have been utilizing although they have been of great benefit for him. Nonetheless at this point he does want to switch to the compression stockings. In my opinion as long as he is continuing with compression I'm okay with the stockings or the wraps. 06/27/17-he is here in follow-up evaluation for multiple ulcerations to his bilateral toes. There is some improvement in appearance. He is compliant in wearing his compression stockings with significant improvement in lower extremity edema. He saw Dr Ola Spurr on 3/8,  per his notes they would redraw ESR and CRP; plan to continue antibiotic therapy if these remain elevated. I do not see an ESR or CRP level in Epic. The patient continues to take antibiotics. 07/11/17-He is here in follow-up evaluation for multiple ulcerations to multiple toes bilaterally. He presents with complete epithelialization to the right third toe; there has been no deterioration. He continues on antibiotic therapy. He will follow-up next week 07/18/17-He is here in follow-up evaluation for multiple ulcerations to multiple toes bilaterally. He continues to make improvement. He continues on antibiotic therapy. He states he has been using something additional to our orders, he does not elaborate but states he will bring it in next week. 07/25/17-He is here in follow up evaluation for multiple ulcerations to bilateral toes. He is stable. He has completed antibiotic therapy. He admits to "filing" his toes after showers each evening, this is what he was referencing last week; he does not filing for the wounds. We will switch to [facility] and monitor for any improvement, he will follow-up next week 08/01/17-He is here in follow-up evaluation. He admits to "picking" at his toes after cleansing yesterday, leading to new areas of tissue loss on the bilateral second toe. There is improvement noted to the bilateral great toe. We will dress toes today and hope that they maintain until Monday where he will come in for a nurse visit. He has been advised, multiple times with expressed verbalization, to change the dressings to silvercel if the dressings get wet prior to [facility] appointment. [name], [name] ([id number]) 08/08/17-He is here in follow-up evaluation for bilateral first and second toe ulcerations. There is significant improvement to all ulcerations since last visit. We will switch to [facility] to all wounds and he will continue with nurse visits on a Monday/Thursday schedule and follow-up  with me in 2 weeks. He continues to smoke, 1-3 cigarettes per day, and has been encouraged to not smoke until his next follow-up in 2 weeks. 08/15/17 on evaluation today patient's ulcers on his toes actually appear to be doing fairly well the right to ulcers may be a little bit more moist compared to the left I'm not really sure exactly why as the openings appear to be very small and I'm not seeing any evidence of anything significant as far as you lightest or otherwise. Nonetheless this may just be a small setback he's been doing very well with the [facility] Dressing 08/22/17-He is here in follow-up evaluation for ulcerations to his bilateral first and second toes, there is small/scant amount of drainage noted on today's dressing. He continues with Hydrofera Blue. He continues to smoke, 1 cigarette a day. Voices no complaint or concerns, compliant and compression therapy today. He'll follow-up next week 08/29/17-He is here in follow-up evaluation for ulcerations to his bilateral first and second toes, with a new wound to the left third toe. He states  he cut himself while cutting the toenail. He presents today with more maceration and increased measurements; waxing and waning measurements and moisture has been an ongoing issue. He continues to smoke a proximally 1 cigarette per day, but states he has not smoked since Sunday. We will initiate medihoney daily and evaluate next week. He presents today without compression stockings 09/05/17-He is here in follow-up evaluation for ulcerations to bilateral first and second and left third toe. He did not pick up the medihoney and therefore has not been changing his dressing. He admits to inconsistent where of compression stockings, admits to pain to his bilateral lower extremities with unilateral edema (right greater than left). The ulcerations to multiple toes are more macerated and larger in size than last week. He is wearing compression therapy today. He  admits to an overall feeling of weakness and fatigue and has been encouraged to contact his PCP for evaluation; he has a known history of anemia and intraabdominal lymphadenopathy. We will return to silvercel and he will follow up next week 09/19/17-He is here in follow-up evaluation for ulcerations to bilateral first and second toe.he was unable to make last week's appointment. The culture that was obtained on 5/30 grew clindamycin sensitive MRSA; he was initiated on 6/4 but did not start it until 6/7. There is significant improvement in all wounds, healing to left second and third toe. He also admits to having no alcohol or smoking for the last 6 days. We will continue with same treatment plan I will extend the clindamycin for an additional 10 days and he will follow-up next week. 09/26/17- He is here in follow evaluation for multiple ulcerations to multiple toes bilaterally. He continues to be non-compliant with dressing, compression therapy. He states he is taking the antibiotics as prescribed, although his recall is inconsistent. There is noted deterioration in all ulcers. We will continue same treatment plan, antibiotic therapy and he will follow up next week. 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 This information is obtained from the medical record, as he cannot articulate specifics to his diagnosis or treatment plan. 10/17/17-He is here in follow-up evaluation for multiple ulcerations to multiple toes bilaterally. There is chronic waxing and waning improvement/deterioration. On today's exam they appear improved, dry without maceration. He continues to apply a variety of topical agents, stating that he has switched between the blue product and silver product. In my opinion, this will be a chronic waxing and waning  giving his intermittent compliance. He will follow-up in 2 weeks. As it relates to the biopsy taken from his buttock, he has not followed up with that and has not started radiation therapy. 10/31/17-He is here for evaluation for multiple ulcerations to multiple toes bilaterally. Continues with waxing and waning improvement versus deterioration. They are stable on today's exam without significant maceration. 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 We will continue with every 2 week follow-up appointments 11/14/17- He is seen in follow-up evaluation for multiple ulcerations to multiple bilateral toes. He is scheduled for excision of basal cell carcinoma to his right buttock on Monday 8/12. He continues to have chronic waxing and waning of his ulcers; today is a stable day with minimal drainage and left ear is better than right toes. I will recount to infectious disease regarding chronic suppressive  therapy. He admits to being more compliant with compression stockings, although he is not wearing any today. He has been encouraged to continue with dressing changes as ordered and compression therapy daily. He will be seen in 2 weeks 11/28/17-He is seen in follow up evaluation for multiple ulcerations to multiple toes bilaterally. He had an overnight admission at [facility] (8/11-8/12) for symptomatic anemia, received two units prbc. His surgical date has been moved to 9/94 excision of basal cell carcinoma to the right buttock. He has been noncompliant in wearing compression stockings, applying topical treatment as ordered; he has been applying a "cream", Hydrofera Blue, silvercel. He is currently on no antibiotic therapy; we will send for re-referral to ID for suppressive therapy given his multiple co-morbidities, poor surgical candidate, unwilling to have toe amputation and general non-adherence. 12/19/17 on evaluation today patient actually appears to be doing very  well in regard to his toes at least compared to last time I saw him. He again has been coming to our office for quite a bit of time. 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 He states is hurting a little bit but doing well in general. I'm see were not taking care of this area dermatology is. He does have his appointment with infectious disease upcoming which they re-consult for suppressive therapy that's next Monday. AJIT, ERRICO ([id number]) 01/02/18 on evaluation today patient actually appears to be doing rather well in regard to his toe ulcers although he tells me he has been put in an ointment on this which I am concerned may be causing some additional moisture buildup which we really do not want. He supposed be using silver cell which is mainly thing I want him to be utilizing. Fortunately there does not appear to be any evidence of worsening infection which is good news. He has had surgery on his gluteal region where he had an excision of a cancerous area fortunately that seems to be doing well although he is having some discomfort. 02/06/18 on evaluation today patient actually appears to be doing well at all locations except for his second toe use bilaterally where the distal tip is still wrong open. He did state that he can't stand for dry skin to buildup on his toes subsequently as a question and further it sounds as if he actually pills this all which is likely keeping the toe is wrong and open. Also think that's what happened in the bottom of his foot although that appears to have healed at this point. There does not appear to be any evidence of infection. 02/20/18 on evaluation today patient appears to be doing better in regard to his foot ulcers. Everything has shown signs of improvement which is excellent news. Overall I'm very pleased with how things appear currently. The  patient states he's not picking her point at any skin and that he wears shoes at all times when he is ambulating at home. Both are things that I've advocated and recommended for him in the past. Electronic Signature(s) Signed: 02/20/2018 5:15:19 PM By: Worthy Keeler PA-C Entered By: Worthy Keeler on 02/20/2018 09:48:59 Burdell, [name] ([id number]) -------------------------------------------------------------------------------- Physical Exam Details Patient Name: Caywood, Rueben E. Date of Service: 02/20/2018 8:15 AM Medical Record Number: [id number] Patient Account Number: [id number] Date of Birth/Sex: [birth year] (67 y.o. M) Treating RN: Montey Hora Primary Care Provider: Lamonte Sakai Other Clinician: Referring  Provider: Lamonte Sakai Treating Provider/Extender: STONE III, HOYT Weeks in Treatment: 28 Constitutional Well-nourished and well-hydrated in no acute distress. Respiratory normal breathing without difficulty. clear to auscultation bilaterally. Cardiovascular regular rate and rhythm with normal S1, S2. Psychiatric this patient is able to make decisions and demonstrates good insight into disease process. Alert and Oriented x 3. pleasant and cooperative. Notes On evaluation today patient's wound bed shows evidence of good granulation at all sites and epithelialization at several sites. Overall I'm very pleased with how things appear at this time. I think that if things continue to do as well as they are currently his feet may completely healed over the next several weeks. Electronic Signature(s) Signed: 02/20/2018 5:15:19 PM By: Worthy Keeler PA-C Entered By: Worthy Keeler on 02/20/2018 09:49:36 Welton, [name] -------------------------------------------------------------------------------- Physician Orders Details Patient Name: Myles, Zeven E. Date of Service: 02/20/2018 8:15 AM Medical Record Number: [id number] Patient Account Number: [id number] Date of  Birth/Sex: October 31, 1950 (67 y.o. M) Treating RN: Montey Hora Primary Care Provider: Lamonte Sakai Other Clinician: Referring Provider: Lamonte Sakai Treating Provider/Extender: Melburn Hake, HOYT Weeks in Treatment: 51 Verbal / Phone Orders: No Diagnosis Coding ICD-10 Coding Code Description L97.521 Non-pressure chronic ulcer of other part of left foot limited to breakdown of skin L97.511 Non-pressure chronic ulcer of other part of right foot limited to breakdown of skin E11.621 Type 2 diabetes mellitus with foot ulcer I87.323 Chronic venous hypertension (idiopathic) with inflammation of bilateral lower extremity I89.0 Lymphedema, not elsewhere classified F17.218 Nicotine dependence, cigarettes, 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 abuse 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, right ankle and foot Wound Cleansing Wound #14 Left Toe Second o Clean wound with Normal Saline. Wound #16 Right,Dorsal Toe Great o Clean wound with Normal Saline. Wound #2 Right Toe Second o Clean wound with Normal Saline. Wound #5 Left Toe Great o Clean wound with Normal Saline. Anesthetic (add to Medication List) Wound #14 Left Toe Second o Topical Lidocaine 4% cream applied to wound bed prior to debridement (In Clinic Only). Wound #16 Right,Dorsal Toe Great o Topical Lidocaine 4% cream applied to wound bed prior to debridement (In Clinic Only). Wound #2 Right Toe Second o Topical Lidocaine 4% cream applied to wound bed prior to debridement (In Clinic Only). Wound #5 Left Toe Great o Topical Lidocaine 4% cream applied to wound bed prior to debridement (In Clinic Only). Primary Wound Dressing Wound #14 Left Toe Second o Silver Alginate - secure with tape Wound #16 Right,Dorsal Toe ADELFO, DIEBEL ([id number]) o Silver Alginate - secure with tape Wound #17 Right,Distal,Plantar Metatarsal head second o Silver Alginate -  secure with tape Wound #2 Right Toe Second o Silver Alginate - secure with tape Wound #5 Left Toe Great o Silver Alginate - secure with tape Dressing Change Frequency Wound #14 Left Toe Second o Change dressing every day. Wound #16 Right,Dorsal Toe Great o Change dressing every day. Wound #17 Right,Distal,Plantar Metatarsal head second o Change dressing every day. Wound #2 Right Toe Second o Change dressing every day. Wound #5 Left Toe Great o Change dressing every day. Follow-up Appointments Wound #14 Left Toe Second o Return Appointment in 3 weeks. Wound #16 Right,Dorsal Toe Great o Return Appointment in 3 weeks. Wound #17 Right,Distal,Plantar Metatarsal head second o Return Appointment in 3 weeks. Wound #2 Right Toe Second o Return Appointment in 3 weeks. Wound #5 Left Toe Great o Return Appointment in 3 weeks. Edema Control Wound #14 Left Toe  Second o Patient to wear own compression stockings Wound #16 Right,Dorsal Toe Great o Patient to wear own compression stockings Wound #2 Right Toe Second o Patient to wear own compression stockings Wound #5 Left Toe Great o Patient to wear own compression stockings Drakes, Gerasimos E. ([id number]) Additional Orders / Instructions Wound #14 Left Toe Second o Stop Smoking o Increase protein intake. Wound #16 Right,Dorsal Toe Great o Stop Smoking o Increase protein intake. Wound #2 Right Toe Second o Stop Smoking o Increase protein intake. Wound #5 Left Toe Great o Stop Smoking o Increase protein intake. Electronic Signature(s) Signed: 02/20/2018 5:15:19 PM By: Worthy Keeler PA-C Signed: 02/20/2018 5:19:23 PM By: Montey Hora Entered By: Montey Hora on 02/20/2018 09:11:07 Korn, [name] ([id number]) -------------------------------------------------------------------------------- Problem List Details Patient Name: Auer, Edris E. Date of Service: 02/20/2018 8:15  AM Medical Record Number: [id number] Patient Account Number: [id number] Date of Birth/Sex: [birth year] (67 y.o. M) Treating RN: Montey Hora Primary Care Provider: Lamonte Sakai Other Clinician: Referring Provider: Lamonte Sakai Treating Provider/Extender: Melburn Hake, HOYT Weeks in Treatment: 35 Active Problems ICD-10 Evaluated Encounter Code Description Active Date Today Diagnosis L97.521 Non-pressure chronic ulcer of other part of left foot limited to 09/21/2016 No Yes breakdown of skin L97.511 Non-pressure chronic ulcer of other part of right foot limited to 09/21/2016 No Yes breakdown of skin E11.621 Type 2 diabetes mellitus with foot ulcer 09/21/2016 No Yes I87.323 Chronic venous hypertension (idiopathic) with inflammation of 09/21/2016 No Yes bilateral lower extremity I89.0 Lymphedema, not elsewhere classified 09/21/2016 No Yes F17.218 Nicotine dependence, cigarettes, with other nicotine-induced 09/21/2016 No Yes disorders F10.19 Alcohol abuse with unspecified alcohol-induced disorder 09/21/2016 No Yes M86.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, right ankle and foot 02/21/2017 No Yes Inactive Problems Resolved Problems Electronic Signature(s) HUTCHINSON, ISENBERG ([id number]) Signed: 02/20/2018 5:15:19 PM By: Worthy Keeler PA-C Entered By: Worthy Keeler on 02/20/2018 08:26:30 Brinker, [name] ([id number]) -------------------------------------------------------------------------------- Progress Note Details Patient Name: Rosander, Natasha E. Date of Service: 02/20/2018 8:15 AM Medical Record Number: [id number] Patient Account Number: [id number] Date of Birth/Sex: October 22, 1950 (67 y.o. M) Treating RN: Montey Hora Primary Care Provider: Lamonte Sakai Other Clinician: Referring Provider: Lamonte Sakai Treating Provider/Extender: Melburn Hake, HOYT Weeks in Treatment: 34 Subjective Chief Complaint Information obtained from Patient Patient presents for treatment of an open diabetic ulcer to both  feet History of Present Illness (HPI) The following HPI elements were documented for the patient's wound: Location: bilateral feet ulceration on the toes Quality: Patient reports experiencing a dull pain to affected area(s). Severity: Patient states wound are getting better Duration: Patient has had the wound for > 3 months prior to seeking treatment at the wound center Timing: Pain in wound is constant (hurts all the time) Context: The wound would happen gradually Modifying Factors: Other treatment(s) tried include:treatment for lymphedema and is seen by the podiatrist Dr. Caryl Comes Associated Signs and Symptoms: Patient reports having increase swelling. 67 year old patient here to see as for bilateral feet ulceration to on his left first and second toe and 2 on his right first and second toe, which she's had for about 4 months. 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 Donella Stade. The patient is also undergoing workup by medical oncology for a intra-abdominal lymphadenopathy. Past medical history significant for CHF, diabetes mellitus, hypertension, varicose veins with lymphedema and squamous cell cancer of the skin of  the buttocks. He is also status post appendectomy, inguinal lymph node biopsy, rectal biopsy and rectal examination under anesthesia. he currently smokes cigarettes about half packet a day. In March of this year he was seen by Dr. Hortencia Pilar, for evaluation of bilateral varicose veins and besides wearing compression stockings he had recommended laser ablation of the right and left great saphenous veins to eleviate the symptoms and complications of severe superficial venous reflux disease. He also recommended lymphedema pumps for better control of his lymphedema. The patient recently has had on 08/23/2016, right greater saphenous vein ablation with  the laser energy Earlier lower extremity venous reflux examination done on 05/08/2016 showed no DVT or SVT both lower legs but incompetence of bilateral great saphenous veins was present. A lower arterial study was also done and there was no significant right lower and left lower extremity problems based on a normal toe brachial index bilaterally and the ABI was 1.21 the left and 1.23 on the right. His post ablation venous duplex examination showed successful ablation of the right GS vein with thrombus formation 2 below the right saphenofemoral junction. The deep system was patent without evidence of thrombosis and this was done on 08/30/2016. 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 Addendum: regarding his x-rays done today and x-ray of the left foot -- IMPRESSION: No objective evidence of osteomyelitis. There are soft tissue changes which may reflect cellulitis. X-ray of the right foot -- IMPRESSION:Findings compatible with cellulitis of the toes. No objective evidence of osteomyelitis is observed. 10/01/16 on evaluation today patient's wounds appeared to be doing some better. I did review the x-rays as well which showed no evidence of osteomyelitis although there was evidence on x-ray of cellulitis. He fortunately is not having any discomfort [name], [name] ([id number]) although he continues to have some swelling. He does not remember being on any antibiotics recently. 10/15/16 on evaluation today patient's wounds overall appear to be doing better although he does have a new location noted on the left foot. Fortunately he is not having significant pain. It almost has the appearance that something is rubbing on the end of his toes but he wears the open toe shoes and according to what he is telling me never wears anything that would rub on his foot. There is no evidence of infection and  specifically no evidence of a fungal infection 10/22/16 On evaluation today patient's wounds appeared to be doing better compared to last week in regard to his bilateral lower extremities. Fortunately I happy with how things are progressing although he still has ulcers I feel like that he is improving and appropriate manner. 11/12/16 on evaluation today patient appears to be doing well in regard to his bilateral feet and the respective wounds. We have been using surrounding her dressings along with an antifungal cream which seems to be doing very well. He has no bilateral dysfunction noticed that the rituals are weight loss at this point. He also has no nausea or vomiting a note purulent discharge. He did see Vein and vascular today and he tells me that they told him he could have surgery for his venous stasis but they did not feel like it was worth it in his words. Fortunately patient's wounds do appear to be getting sneakily better. 11/26/2016 -- he says he is going to have some surgery during this week at [facility] for  possibly a colon resection. 12/31/2016 -- the patient has been noncompliant with his smoking and I'm not sure whether he is also started drinking again. He continues to be very nonchalant about his care 01/14/2017 -- the patient's HandP has been reviewed well and I understand he is being compliant with trying to give up smoking and his local dressing changes. He does not have any surgical options of 4 to him by his vascular surgeons.he was last seen in early August by Dr. Hortencia Pilar who recommended compression stockings,and possibly lymph pumps in 2-3 months after doing a review ultrasound. 01/28/2017 - the patient did not have any fresh complaints but on examination I noted a large lacerated wound on the plantar aspect of his right fourth toe which had a lot of necrotic debris and it probes down to bone. 02/07/2017 -- x-ray of the right foot -- IMPRESSION: Soft tissue  swelling about the first through fourth toes consistent with cellulitis. New destructive change in the tuft of the distal phalanx of the great toe is consistent with osteomyelitis. 02/14/2017 -- the patient's MRI is pending this coming Monday and he still continues to smoke. We have again gone over off loading of his wounds in great detail and he says he's been compliant. 02/21/2017 -- MR of the right foot -- IMPRESSION: 1. Soft tissue ulcer at the tip of the first, second and third toe knows. Cortical irregularity and bone marrow edema in the first distal phalanx most concerning for osteomyelitis. Mild marrow edema in the second and third distal phalanx without definite cortical destruction which may reflect early osteomyelitis versus reactive marrow edema. 2. Soft tissue edema surrounding the first phalanx most consistent with cellulitis. the patient was also recently evaluated by his medical oncologist Dr. 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 She is treating him with weekly Procrit. She is also keeping intra-abdominal lymphadenopathy and right lower lobe lung nodule under observation. 04/04/2017 -- he was seen by Dr. Adrian Prows on 03/25/2017 -- after review he empirically put him on ciprofloxacin and doxycycline as they have good bone penetration and good bioavailability and it will cover the usual pathogens and diabetic foot osteomyelitis. He will check inflammatory markers and plan a 35-57 week old records. C-reactive protein was 0.3 and the ESR was 72 04/18/17 on evaluation today patient appears to be doing about the same in regard to his lower extremity wounds bilaterally. He has continued to use the antifungal cream which does seem to be beneficial. Nonetheless the ulcers do seem to in some areas be epithelial eyes over and in other areas are still open. He is having no significant discomfort. 04/25/17-he is  here in follow-up evaluation for multiple ulcerations to multiple toes bilaterally. He states he did see Dr. Ola Spurr again last week and continues antibiotic therapy. He is voicing no complaints or concerns, will continue with current treatment plan will possibility of adding compression therapy next week after an additional week of treatment/lotions to BLE prescribed by Dr Ola Spurr 05/02/17 he is here in follow up for for multiple ulcers to multiple toes bilaterally. we will stop using antifungal cream and will continue with silvercel and follow up next week [name], [name] ([id number]) 05/09/17-he is here in follow-up for multiple ulcerations to multiple toes bilaterally. There is improvement in appearance. He has not completely stopped using antifungal cream, but admits he has not using it between the toes. He has an appointment  with Dr. Ola Spurr on 2/11, continues on doxycycline and Cipro. It has been 5 weeks of antibiotic therapy, we will order plain film xray to evaluate for osteomyelitis next week, prior to follow up with ID. Will continue with silvercel and follow up next week 05/16/17-he is here in follow-up evaluation for multiple ulcerations to multiple toes bilaterally and new wound to the right posterior heel. There is essentially no change in appearance, deteriorating measurements; he has a history of waxing and waning measurements. He admits that he continues to apply moisturizer/cream/ointment to his toes despite weekly reminders to only apply silvercel to his toes. He states that he thinks the surgical shoe contributed to the superficial ulcer to his posterior heel, he is unable to articulate if this was an area of dry cracked skin as he has a similar area to the left heel. He now is wearing open toed slippers. He has an appointment with Dr. Ola Spurr on 2/11. We have ordered x-rays for her bilateral feet; he was advised to obtain the x-rays today or tomorrow. He will follow-up  next week 05/23/17-he is here in follow-up evaluation for multiple ulcerations to multiple toes bilaterally and the right posterior heel. There is improvement in maceration. He has been compliant and not applying any moisturizing agent to his toes. He has been using Lac-Hydrin for his lower extremities with improvement. He did not go to his appointment on Monday with Dr. Ola Spurr secondary to financial concerns. X-rays for her bilateral feet showed: LEFT FOOT with slight erosion of the tuft of the distal phalanges of the left first and second toe suspicious for osteomyelitis, RIGHT FOOT with 1.erosion of the tufts of the distal phalanges of the right first second and possibly third toes consistent with osteomyelitis, 2 no definitive abnormality of the calcaneus is seen on the images obtained, 3. Plantar calcaneal degenerative spur. We briefly discussed hyperbaric adjunctive therapy for treatment of chronic refractory osteomyelitis. I do not find an a1c in EMR, will contact PCP for record, or order if needed. He has been encouraged to contact Dr Ola Spurr office regarding the follow-up appointment, encouraged him to inquire about payment plan. We will continue with same treatment plan and follow-up next week. He states he is still taking antibiotics and has "a lot" left. He states he has been taking them as directed, 2 pills twice daily. According to Dr. Blane Ohara office notes he was originally started on 12/17 for 4 weeks and extended on 1/14 for an additional 4 weeks. He should be done with his antibiotic therapy, he was advised to bring his bottles and to his next appointment, we will contact pharmacy. 05/30/17-he is here in follow-up evaluation for multiple ulcerations to multiple toes bilaterally and the right posterior heel. He is accompanied by his brother-in-law. Wounds are stable. He has yet to make up with Dr. Ola Spurr. We contacted his PCP, with no record of recent A1c we will draw  an A1c. His brother-in-law states that he was taken off all of his diabetic medication secondary to kidney function. 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 The antibiotics were counted and have approximately 2 weeks left, although they should be complete. The pill organizer reveals missing days. We discussed the need for consistent medications, to have optimal benefit of medication. He has a cousin that lives with him and he will ask her to check his organizer daily. He has been advised to follow up  with Dr Ola Spurr, and will go by the office today. He has been advised to quit smoking. 06/06/17-he is here in follow up evaluation. He has had to make an appointment with Dr. Ola Spurr. He did have blood work obtained, a1c 5. He continues to take antibiotic therapy. Significant improvement in bilateral lower extremity edema with compression therapy. Essentially no change in ulcerations to toes. He states he is "going to try something different" and "let me know next week" if it works; he would not provide any additional information and was encouraged to follow our orders. We will follow up next week 06/13/17-he is here in follow-up evaluation. He has an appointment with Dr. Ola Spurr tomorrow morning. He states he purchased an ointment from [facility] and applied to his toes for 3 days, he does not remember the name of the ointment. There is improvement to his wounds, minimal maceration. He continues to take antibiotic therapy, this should have been completed last month. His brother-in-law who regularly accompanies his appointments was asked to take the bottles to the appointment tomorrow with Dr. Ola Spurr so he is aware. We will continue with 3 layer compression, and order OPEN TOE compression 20-30mmHg; we will apply compression stockings next week. He continues to smoke, smoked "2 cigarettes"  last week 06/20/17 on evaluation today patient did receive his compression stockings which he has with him today for both lower extremities. With that being said he tells me at this point in time that he is very happy to have these he really is not a big fan of the compression wraps that we have been utilizing although they have been of great benefit for him. Nonetheless at this point he does want to switch to the compression stockings. In my opinion as long as he is continuing with compression I'm okay with the stockings or the wraps. 06/27/17-he is here in follow-up evaluation for multiple ulcerations to his bilateral toes. There is some improvement in appearance. He is compliant in wearing his compression stockings with significant improvement in lower extremity edema. He saw Dr Ola Spurr on 3/8, per his notes they would redraw ESR and CRP; plan to continue antibiotic therapy if these remain elevated. I do not see an ESR or CRP level in Epic. The patient continues to take antibiotics. 07/11/17-He is here in follow-up evaluation for multiple ulcerations to multiple toes bilaterally. He presents with complete epithelialization to the right third toe; there has been no deterioration. He continues on antibiotic therapy. He will follow-up next week 07/18/17-He is here in follow-up evaluation for multiple ulcerations to multiple toes bilaterally. He continues to make improvement. He continues on antibiotic therapy. He states he has been using something additional to our orders, he does Hone, Tequan E. ([id number]) not elaborate but states he will bring it in next week. 07/25/17-He is here in follow up evaluation for multiple ulcerations to bilateral toes. He is stable. He has completed antibiotic therapy. He admits to "filing" his toes after showers each evening, this is what he was referencing last week; he does not filing for the wounds. We will switch to [facility] and monitor for any improvement,  he will follow-up next week 08/01/17-He is here in follow-up evaluation. He admits to "picking" at his toes after cleansing yesterday, leading to new areas of tissue loss on the bilateral second toe. There is improvement noted to the bilateral great toe. We will dress toes today and hope that they maintain until Monday where he will come in for a nurse visit.  He has been advised, multiple times with expressed verbalization, to change the dressings to silvercel if the dressings get wet prior to [facility] appointment. 08/08/17-He is here in follow-up evaluation for bilateral first and second toe ulcerations. There is significant improvement to all ulcerations since last visit. We will switch to [facility] to all wounds and he will continue with nurse visits on a Monday/Thursday schedule and follow-up with me in 2 weeks. He continues to smoke, 1-3 cigarettes per day, and has been encouraged to not smoke until his next follow-up in 2 weeks. 08/15/17 on evaluation today patient's ulcers on his toes actually appear to be doing fairly well the right to ulcers may be a little bit more moist compared to the left I'm not really sure exactly why as the openings appear to be very small and I'm not seeing any evidence of anything significant as far as you lightest or otherwise. Nonetheless this may just be a small setback he's been doing very well with the [facility] Dressing 08/22/17-He is here in follow-up evaluation for ulcerations to his bilateral first and second toes, there is small/scant amount of drainage noted on today's dressing. He continues with Hydrofera Blue. He continues to smoke, 1 cigarette a day. Voices no complaint or concerns, compliant and compression therapy today. He'll follow-up next week 08/29/17-He is here in follow-up evaluation for ulcerations to his bilateral first and second toes, with a new wound to the left third toe. He states he cut himself while cutting the toenail. He presents  today with more maceration and increased measurements; waxing and waning measurements and moisture has been an ongoing issue. He continues to smoke a proximally 1 cigarette per day, but states he has not smoked since Sunday. We will initiate medihoney daily and evaluate next week. He presents today without compression stockings 09/05/17-He is here in follow-up evaluation for ulcerations to bilateral first and second and left third toe. He did not pick up the medihoney and therefore has not been changing his dressing. He admits to inconsistent where of compression stockings, admits to pain to his bilateral lower extremities with unilateral edema (right greater than left). The ulcerations to multiple toes are more macerated and larger in size than last week. He is wearing compression therapy today. He admits to an overall feeling of weakness and fatigue and has been encouraged to contact his PCP for evaluation; he has a known history of anemia and intraabdominal lymphadenopathy. We will return to silvercel and he will follow up next week 09/19/17-He is here in follow-up evaluation for ulcerations to bilateral first and second toe.he was unable to make last week's appointment. The culture that was obtained on 5/30 grew clindamycin sensitive MRSA; he was initiated on 6/4 but did not start it until 6/7. There is significant improvement in all wounds, healing to left second and third toe. He also admits to having no alcohol or smoking for the last 6 days. We will continue with same treatment plan I will extend the clindamycin for an additional 10 days and he will follow-up next week. 09/26/17- He is here in follow evaluation for multiple ulcerations to multiple toes bilaterally. He continues to be non-compliant with dressing, compression therapy. He states he is taking the antibiotics as prescribed, although his recall is inconsistent. There is noted deterioration in all ulcers. We will continue same  treatment plan, antibiotic therapy and he will follow up next week. 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 This information is obtained from the medical record, as he cannot articulate specifics to his diagnosis or treatment plan. 10/17/17-He is here in follow-up evaluation for multiple ulcerations to multiple toes bilaterally. There is chronic waxing and waning improvement/deterioration. On today's exam they appear improved, dry without maceration. He continues to apply a variety of topical agents, stating that he has switched between the blue product and silver product. In my opinion, this will be a chronic waxing and waning giving his intermittent compliance. He will follow-up in 2 weeks. As it relates to the biopsy taken from his buttock, he has not followed up with that and has not started radiation therapy. 10/31/17-He is here for evaluation for multiple ulcerations to multiple toes bilaterally. Continues with waxing and waning improvement versus deterioration. They are stable on today's exam without significant maceration. 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 We will continue with every 2 week follow-up appointments 11/14/17- He is seen in follow-up evaluation for multiple ulcerations to multiple bilateral toes. He is scheduled for excision of basal cell carcinoma to his right buttock on Monday 8/12. He continues to have chronic waxing and waning of his ulcers; today is a stable day with minimal drainage and left ear is better than right toes. I will recount to infectious disease regarding chronic suppressive therapy. He admits to being more compliant with compression stockings, although he is not wearing any today. He has been encouraged to continue with dressing changes as ordered  and compression therapy daily. He will be seen in 2 weeks 11/28/17-He is seen in follow up evaluation for multiple ulcerations to multiple toes bilaterally. He had an overnight admission at [facility] (8/11-8/12) for symptomatic anemia, received two units prbc. His surgical date has been moved to 9/94 excision of basal cell carcinoma to the right buttock. He has been noncompliant in wearing compression stockings, applying topical treatment as ordered; he has been applying a "cream", Hydrofera Blue, silvercel. He is currently on no antibiotic [name], [name] ([id number]) therapy; we will send for re-referral to ID for suppressive therapy given his multiple co-morbidities, poor surgical candidate, unwilling to have toe amputation and general non-adherence. 12/19/17 on evaluation today patient actually appears to be doing very well in regard to his toes at least compared to last time I saw him. He again has been coming to our office for quite a bit of time. 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 He states is hurting a little bit but doing well in general. I'm see were not taking care of this area dermatology is. He does have his appointment with infectious disease upcoming which they re-consult for suppressive therapy that's next Monday. 01/02/18 on evaluation today patient actually appears to be doing rather well in regard to his toe ulcers although he tells me he has been put in an ointment on this which I am concerned may be causing some additional moisture buildup which we really do not want. He supposed be using silver cell which is mainly thing I want him to be utilizing. Fortunately there does not appear to be any evidence of worsening infection which is good news. He has had surgery on his gluteal region where he had an excision of a  cancerous area fortunately that seems to be  doing well although he is having some discomfort. 02/06/18 on evaluation today patient actually appears to be doing well at all locations except for his second toe use bilaterally where the distal tip is still wrong open. He did state that he can't stand for dry skin to buildup on his toes subsequently as a question and further it sounds as if he actually pills this all which is likely keeping the toe is wrong and open. Also think that's what happened in the bottom of his foot although that appears to have healed at this point. There does not appear to be any evidence of infection. 02/20/18 on evaluation today patient appears to be doing better in regard to his foot ulcers. Everything has shown signs of improvement which is excellent news. Overall I'm very pleased with how things appear currently. The patient states he's not picking her point at any skin and that he wears shoes at all times when he is ambulating at home. Both are things that I've advocated and recommended for him in the past. Patient History Information obtained from Patient. Social History Current every day smoker, Marital Status - Widowed, Alcohol Use - Daily - quit drinking about a week ago, Drug Use - No History, Caffeine Use - Moderate. 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 Hematologic/Lymphatic The patient has no complaints or symptoms. Respiratory The patient has no complaints or symptoms. Cardiovascular The patient has no complaints or symptoms. Psychiatric The patient has no complaints or symptoms. Objective Bessey, Jadie E. ([id number]) Constitutional Well-nourished and well-hydrated in no acute distress. Vitals Time Taken: 8:21 AM, Height: 69 in, Weight: 168 lbs, BMI: 24.8, Temperature: 98.0 F, Pulse: 65 bpm,  Respiratory Rate: 16 breaths/min, Blood Pressure: 172/88 mmHg. Respiratory normal breathing without difficulty. clear to auscultation bilaterally. Cardiovascular regular rate and rhythm with normal S1, S2. Psychiatric this patient is able to make decisions and demonstrates good insight into disease process. Alert and Oriented x 3. pleasant and cooperative. General Notes: On evaluation today patient's wound bed shows evidence of good granulation at all sites and epithelialization at several sites. Overall I'm very pleased with how things appear at this time. I think that if things continue to do as well as they are currently his feet may completely healed over the next several weeks. Integumentary (Hair, Skin) Wound #1 status is Healed - Epithelialized. Original cause of wound was Gradually Appeared. The wound is located on the Right Toe Great. The wound measures 0cm length x 0cm width x 0cm depth; 0cm^2 area and 0cm^3 volume. Wound #14 status is Open. Original cause of wound was Gradually Appeared. The wound is located on the Left Toe Second. The wound measures 0.1cm length x 0.1cm width x 0.1cm depth; 0.008cm^2 area and 0.001cm^3 volume. There is Fat Layer (Subcutaneous Tissue) Exposed exposed. There is no tunneling or undermining noted. There is a medium amount of serous drainage noted. The wound margin is flat and intact. There is large (67-100%) pink granulation within the wound bed. There is a small (1-33%) amount of necrotic tissue within the wound bed including Adherent Slough. The periwound skin appearance did not exhibit: Callus, Crepitus, Excoriation, Induration, Rash, Scarring, Dry/Scaly, Maceration, Atrophie Blanche, Cyanosis, Ecchymosis, Hemosiderin Staining, Mottled, Pallor, Rubor, Erythema. Wound #16 status is Open. Original cause of wound was Not Known. The wound  is located on the Right,[employer]orsal [employer] The wound measures 0.5cm length x 0.6cm width x 0.1cm depth; 0.236cm^2  area and 0.024cm^3 volume. There is Fat Layer (Subcutaneous Tissue) Exposed exposed. There is no tunneling or undermining noted. There is a medium amount of serous drainage noted. The wound margin is flat and intact. There is large (67-100%) red granulation within the wound bed. There is a small (1-33%) amount of necrotic tissue within the wound bed including Adherent Slough. The periwound skin appearance did not exhibit: Callus, Crepitus, Excoriation, Induration, Rash, Scarring, Dry/Scaly, Maceration, Atrophie Blanche, Cyanosis, Ecchymosis, Hemosiderin Staining, Mottled, Pallor, Rubor, Erythema. Periwound temperature was noted as No Abnormality. Wound #17 status is Open. Original cause of wound was Trauma. The wound is located on the Right,Distal,Plantar Metatarsal head second. The wound measures 0.3cm length x 0.6cm width x 0.2cm depth; 0.141cm^2 area and 0.028cm^3 volume. There is no tunneling or undermining noted. There is a medium amount of serous drainage noted. The wound margin is flat and intact. There is large (67-100%) pink granulation within the wound bed. There is no necrotic tissue within the wound bed. The periwound skin appearance did not exhibit: Callus, Crepitus, Excoriation, Induration, Rash, Scarring, Dry/Scaly, Maceration, Atrophie Blanche, Cyanosis, Ecchymosis, Hemosiderin Staining, Mottled, Pallor, Rubor, Erythema. Periwound temperature was noted as No Abnormality. Wound #2 status is Open. Original cause of wound was Gradually Appeared. The wound is located on the Right Toe Second. The wound measures 0.2cm length x 0.2cm width x 0.1cm depth; 0.031cm^2 area and 0.003cm^3 volume. There is Fat Layer (Subcutaneous Tissue) Exposed exposed. There is no tunneling or undermining noted. There is a medium amount of serous drainage noted. The wound margin is flat and intact. There is large (67-100%) pink granulation within the wound bed. There is a small (1-33%) amount of necrotic tissue  within the wound bed including Adherent Slough. The periwound skin appearance did not exhibit: Callus, Crepitus, Excoriation, Induration, Rash, Scarring, Dry/Scaly, Maceration, Atrophie Blanche, Cyanosis, Ecchymosis, Hemosiderin Staining, Mottled, Pallor, Rubor, Erythema. Periwound temperature was noted as No Abnormality. Wound #5 status is Open. Original cause of wound was Gradually Appeared. The wound is located on the Left Toe Great. [name], [name] ([id number]) The wound measures 0.1cm length x 0.1cm width x 0.1cm depth; 0.008cm^2 area and 0.001cm^3 volume. There is Fat Layer (Subcutaneous Tissue) Exposed exposed. There is no tunneling or undermining noted. There is a medium amount of serous drainage noted. The wound margin is flat and intact. There is large (67-100%) pink granulation within the wound bed. There is a small (1-33%) amount of necrotic tissue within the wound bed including Adherent Slough. The periwound skin appearance did not exhibit: Callus, Crepitus, Excoriation, Induration, Rash, Scarring, Dry/Scaly, Maceration, Atrophie Blanche, Cyanosis, Ecchymosis, Hemosiderin Staining, Mottled, Pallor, Rubor, Erythema. 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 Wound #16 Right,Dorsal Toe Great: Clean wound with Normal Saline. Wound #2 Right Toe Second: Clean wound with Normal Saline. Wound #5 Left Toe  Great: Clean wound with Normal Saline. Anesthetic (add to Medication List): Wound #14 Left Toe  Second: Topical Lidocaine 4% cream applied to wound bed prior to debridement (In Clinic Only). Wound #16 Right,Dorsal Toe Great: Topical Lidocaine 4% cream applied to wound bed prior to debridement (In Clinic Only). Wound #2 Right Toe Second: Topical Lidocaine 4% cream applied to wound bed prior to debridement (In Clinic Only). Wound #5 Left Toe Great: Topical Lidocaine 4% cream applied to wound bed prior to debridement (In Clinic Only). Primary Wound Dressing: Wound #14 Left Toe Second: Silver Alginate - secure with tape Wound #16 Right,Dorsal Toe Great: Silver Alginate - secure with tape Wound #17 Right,Distal,Plantar Metatarsal head second: Silver Alginate - secure with tape Wound #2 Right Toe Second: Silver Alginate - secure with tape [name], [name] ([id number]) Wound #5 Left Toe Great: Silver Alginate - secure with tape Dressing Change Frequency: Wound #14 Left Toe Second: Change dressing every day. Wound #16 Right,Dorsal Toe Great: Change dressing every day. Wound #17 Right,Distal,Plantar Metatarsal head second: Change dressing every day. Wound #2 Right Toe Second: Change dressing every day. Wound #5 Left Toe Great: Change dressing every day. Follow-up Appointments: Wound #14 Left Toe Second: Return Appointment in 3 weeks. Wound #16 Right,Dorsal Toe Great: Return Appointment in 3 weeks. Wound #17 Right,Distal,Plantar Metatarsal head second: Return Appointment in 3 weeks. Wound #2 Right Toe Second: Return Appointment in 3 weeks. Wound #5 Left Toe Great: Return Appointment in 3 weeks. Edema Control: Wound #14 Left Toe Second: Patient to wear own compression stockings Wound #16 Right,Dorsal Toe Great: Patient to wear own compression stockings Wound #2 Right Toe Second: Patient to wear own compression stockings Wound #5 Left Toe Great: Patient to wear own compression  stockings Additional Orders / Instructions: Wound #14 Left Toe Second: Stop Smoking Increase protein intake. Wound #16 Right,Dorsal Toe Great: Stop Smoking Increase protein intake. Wound #2 Right Toe Second: Stop Smoking Increase protein intake. Wound #5 Left Toe Great: Stop Smoking Increase protein intake. We will see him back for reevaluation three weeks time due to the holiday we generally seen at a two week interval but that is Thanksgiving. He is definitely okay with that if he has any problems or concerns the meantime will contact the office and let me know. Otherwise will see him back to see were things stand at that point. Electronic Signature(s) Signed: 02/20/2018 5:15:19 PM By: Worthy Keeler PA-C Entered By: Worthy Keeler on 02/20/2018 09:49:57 Myler, AQUILES RUFFINI ([id number]) Paul, [name] ([id number]) -------------------------------------------------------------------------------- ROS/PFSH Details Patient Name: Southwood, Darvin E. Date of Service: 02/20/2018 8:15 AM Medical Record Number: [id number] Patient Account Number: [id number] Date of Birth/Sex: [birth year] (67 y.o. M) Treating RN: Montey Hora Primary Care Provider: Lamonte Sakai Other Clinician: Referring Provider: Lamonte Sakai Treating Provider/Extender: Melburn Hake, HOYT Weeks in Treatment: 37 Information Obtained From Patient Wound History Do you currently have one or more open woundso Yes How many open wounds do you currently haveo 6 Approximately how long have you had your woundso 3 months How have you been treating your wound(s) until nowo ointment and bandage Has your wound(s) ever healed and then re-openedo No Have you had any lab work done in the past montho No Have you tested positive for an antibiotic resistant organism (MRSA, VRE)o No Have you tested positive for osteomyelitis (bone infection)o No Have you had any tests for circulation on your legso Yes Who ordered the testo PCP Where was the  test doneo [facility] Constitutional Symptoms (General Health) Complaints and Symptoms: Negative for: Fever; Chills Eyes Medical History: Negative for: Cataracts; Glaucoma;  Optic Neuritis Ear/Nose/Mouth/Throat Medical History: Negative for: Chronic sinus problems/congestion; Middle ear problems Hematologic/Lymphatic Complaints and Symptoms: No Complaints or Symptoms Medical History: Positive for: Anemia; Lymphedema Negative for: Hemophilia; Human Immunodeficiency Virus; Sickle Cell Disease Respiratory Complaints and Symptoms: No Complaints or Symptoms Medical History: Negative for: Aspiration; Asthma; Chronic Obstructive Pulmonary Disease (COPD); Pneumothorax; Sleep Apnea; Tuberculosis Cardiovascular Fabel, Erol E. ([id number]) Complaints and Symptoms: No Complaints or Symptoms Medical History: Positive for: Congestive Heart Failure; Hypertension; Peripheral Venous Disease Negative for: Angina; Arrhythmia; Coronary Artery Disease; Deep Vein Thrombosis; Hypotension; Myocardial Infarction; Peripheral Arterial Disease; Phlebitis; Vasculitis Gastrointestinal Medical History: Negative for: Cirrhosis ; Colitis; Crohnos; Hepatitis A; Hepatitis B; Hepatitis C Endocrine Medical History: Positive for: Type II Diabetes Treated with: Oral agents Blood sugar tested every day: Yes Tested : QD Genitourinary Medical History: Negative for: End Stage Renal Disease Immunological Medical History: Negative for: Lupus Erythematosus; Raynaudos; Scleroderma Integumentary (Skin) Medical History: Negative for: History of Burn; History of pressure wounds Musculoskeletal Medical History: Negative for: Gout; Rheumatoid Arthritis; Osteoarthritis; Osteomyelitis Neurologic Medical History: Positive for: Neuropathy Negative for: Dementia; Quadriplegia; Paraplegia; Seizure Disorder Oncologic Medical History: Past Medical History Notes: 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 with unknown  treatment Psychiatric Complaints and Symptoms: No Complaints or Symptoms Immunizations [name], [name] ([id number]) Pneumococcal Vaccine: Received Pneumococcal Vaccination: No Immunization Notes: up to date Implantable Devices Family and Social History Current every day smoker; Marital Status - Widowed; Alcohol Use: Daily - quit drinking about a week ago; Drug Use: No History; Caffeine Use: Moderate; Financial Concerns: No; Food, Clothing or Shelter Needs: No; Support System Lacking: No; Transportation Concerns: No; Advanced Directives: No; Patient does not want information on Advanced Directives Physician Affirmation I have reviewed and agree with the above information. Electronic Signature(s) Signed: 02/20/2018 5:15:19 PM By: Worthy Keeler PA-C Signed: 02/20/2018 5:19:23 PM By: Montey Hora Entered By: Worthy Keeler on 02/20/2018 09:49:15 [name], [name] ([id number]) -------------------------------------------------------------------------------- SuperBill Details Patient Name: Bennison, Cayde E. Date of Service: 02/20/2018 Medical Record Number: [id number] Patient Account Number: [id number] Date of Birth/Sex: 16-Jan-1951 (67 y.o. M) Treating RN: Montey Hora Primary Care Provider: Lamonte Sakai Other Clinician: Referring Provider: Lamonte Sakai Treating Provider/Extender: Melburn Hake, HOYT Weeks in Treatment: 52 Diagnosis Coding ICD-10 Codes Code Description L97.521 Non-pressure chronic ulcer of other part of left foot limited to breakdown of skin L97.511 Non-pressure chronic ulcer of other part of right foot limited to breakdown of skin E11.621 Type 2 diabetes mellitus with foot ulcer I87.323 Chronic venous hypertension (idiopathic) with inflammation of bilateral lower extremity I89.0 Lymphedema, not elsewhere classified F17.218 Nicotine dependence, cigarettes, 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 abuse 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  Chronic multifocal osteomyelitis, right ankle and foot Facility Procedures CPT4 Code: [id number] Description: 79892 - WOUND CARE VISIT-LEV 5 EST PT Modifier: Quantity: 1 Physician Procedures CPT4 Code Description: [id number] 08144 - WC PHYS LEVEL 4 - EST PT ICD-10 Diagnosis Description L97.521 Non-pressure chronic ulcer of other part of left foot limited to L97.511 Non-pressure chronic ulcer of other part of right foot limited t E11.621 Type 2  diabetes mellitus with foot ulcer I87.323 Chronic venous hypertension (idiopathic) with inflammation of bi Modifier: breakdown of s o breakdown of lateral lower e Quantity: 1 kin skin xtremity Electronic Signature(s) Signed: 02/20/2018 5:15:19 PM By: Worthy Keeler PA-C Previous Signature: 02/20/2018 9:45:21 AM Version By: Montey Hora Entered By: Worthy Keeler on 02/20/2018 09:50:12

## 2018-03-11 ENCOUNTER — Encounter: Payer: Self-pay | Admitting: General Surgery

## 2018-03-11 ENCOUNTER — Other Ambulatory Visit: Payer: Self-pay

## 2018-03-11 ENCOUNTER — Ambulatory Visit (INDEPENDENT_AMBULATORY_CARE_PROVIDER_SITE_OTHER): Payer: Medicare HMO | Admitting: General Surgery

## 2018-03-11 VITALS — BP 138/72 | HR 82 | Resp 18 | Ht 72.0 in | Wt 181.0 lb

## 2018-03-11 DIAGNOSIS — C44519 Basal cell carcinoma of skin of other part of trunk: Secondary | ICD-10-CM

## 2018-03-11 NOTE — Patient Instructions (Addendum)
The patient is aware to call back for any questions or new concerns.  

## 2018-03-11 NOTE — Progress Notes (Signed)
Patient ID: [name], male   DOB: 1950/06/19, 67 y.o.   MRN: [id number]  Chief Complaint  Patient presents with  . Follow-up    HPI [name] is a 67 y.o. male.  Here for postoperative visit, excision basel cell right gluteal mass on 12-16-17. Bowels move every other day, no bleeding. He states his right shoulder is bothering him, followed by Dr Humphrey Rolls.  HPI  Past Medical History:  Diagnosis Date  . Anemia   . Cancer ([place]) 11/02/2014   PERIANAL MASS, RIGHT; INCISIONAL BIOPSY: INVASIVE CARCINOMA WITH BASALOID FEATURES  . CHF (congestive heart failure) ([place])   . Chronic kidney disease   . COPD (chronic obstructive pulmonary disease) ([place])    NO INHALERS  . Diabetes mellitus    NO MEDS  . GERD (gastroesophageal reflux disease)   . Hepatic cirrhosis ([place])   . Hypertension   . Leg swelling   . Loose, teeth    PATIENT STATES "HAS 6 TEETH, SOME ARE LOOSE"  . Neuromuscular disorder ([place])    RIGHT HAND AND FINGERS NUMB  . Shortness of breath dyspnea    WITH EXERTION   . 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 APPENDECTOMY    . COLONOSCOPY WITH PROPOFOL N/A 11/19/2014   Procedure: COLONOSCOPY WITH PROPOFOL;  Surgeon: Lucilla Lame, MD;  Location: [place];  Service: Endoscopy;  Laterality: N/A;  DIABETIC-ORAL MEDS  . COLONOSCOPY WITH PROPOFOL N/A 10/04/2016   Procedure: COLONOSCOPY WITH PROPOFOL;  Surgeon: Jonathon Bellows, MD;  Location: [facility] ENDOSCOPY;  Service: Endoscopy;  Laterality: N/A;  . ESOPHAGOGASTRODUODENOSCOPY (EGD) WITH PROPOFOL N/A 10/04/2016   Procedure: ESOPHAGOGASTRODUODENOSCOPY (EGD) WITH PROPOFOL;  Surgeon: Jonathon Bellows, MD;  Location: [facility] ENDOSCOPY;  Service: Endoscopy;  Laterality: N/A;  . GIVENS CAPSULE STUDY N/A 11/13/2016   Procedure: GIVENS CAPSULE STUDY;  Surgeon: Jonathon Bellows, MD;  Location: [facility] ENDOSCOPY;  Service: Gastroenterology;  Laterality: N/A;  . INCISION AND DRAINAGE  PERIRECTAL ABSCESS Right 12/16/2017   Procedure: EXCISION BASAL CELL CANCER/ GLUTEAL ABSCESS AND FLAP COVERAGE;  Surgeon: Robert Bellow, MD;  Location: [facility] ORS;  Service: General;  Laterality: Right;  . INGUINAL LYMPH NODE BIOPSY Right 01/26/2016   Procedure: INGUINAL LYMPH NODE BIOPSY;  Surgeon: Clayburn Pert, MD;  Location: [facility] ORS;  Service: General;  Laterality: Right;  . RECTAL BIOPSY N/A 11/02/2014   Procedure: BIOPSY RECTAL;  Surgeon: Marlyce Huge, MD;  Location: [facility] ORS;  Service: General;  Laterality: N/A;  . RECTAL EXAM UNDER ANESTHESIA N/A 11/02/2014   Procedure: RECTAL EXAM UNDER ANESTHESIA;  Surgeon: Marlyce Huge, MD;  Location: [facility] ORS;  Service: General;  Laterality: N/A;  . TONSILLECTOMY      Family History  Problem Relation Age of Onset  . Asthma Mother   . Colon cancer Neg Hx     Social History Social History   Tobacco Use  . Smoking status: Current Every Day Smoker    Packs/day: 0.50    Years: 20.00    Pack years: 10.00    Types: Cigarettes  . Smokeless tobacco: Never Used  . Tobacco comment: 1 PACK EVERY 2 DAYS  Substance Use Topics  . Alcohol use: Not Currently  . Drug use: Yes    Types: Marijuana    Comment: Last Use 2016 per patient-+ UDS FOR COCAINE IN 2014    No Known Allergies  Current Outpatient Medications  Medication Sig Dispense Refill  . amLODipine (  NORVASC) 10 MG tablet Take 10 mg by mouth every morning.     Marland Kitchen atorvastatin (LIPITOR) 40 MG tablet Take 40 mg by mouth every morning.     . bismuth subsalicylate (PEPTO BISMOL) 262 MG/15ML suspension Take 30 mLs by mouth every 6 (six) hours as needed for indigestion or diarrhea or loose stools.     . docusate sodium (COLACE) 100 MG capsule Take 1 capsule (100 mg total) by mouth 2 (two) times daily. 10 capsule 0  . ferrous sulfate 325 (65 FE) MG tablet Take 325 mg by mouth daily.     . hydrALAZINE (APRESOLINE) 25 MG tablet Take 25 mg 2 (two) times daily by mouth.    Marland Kitchen  omeprazole (PRILOSEC) 40 MG capsule Take 40 mg by mouth every morning.     . polyethylene glycol (MIRALAX / GLYCOLAX) packet Take 17 g by mouth daily as needed for mild constipation.     . silver sulfADIAZINE (SILVADENE) 1 % cream Apply to affected area twice daily 400 g 1  . sodium bicarbonate 650 MG tablet Take 1 tablet (650 mg total) by mouth 3 (three) times daily. 20 tablet 0  . vitamin B-12 (CYANOCOBALAMIN) 1000 MCG tablet Take 1 tablet (1,000 mcg total) by mouth daily. 30 tablet 3  . Vitamin D, Ergocalciferol, (DRISDOL) 50000 units CAPS capsule Take 50,000 Units by mouth every Tuesday.     . nicotine (NICODERM CQ - DOSED IN MG/24 HOURS) 21 mg/24hr patch Place 1 patch (21 mg total) onto the skin daily. (Patient not taking: Reported on 03/11/2018) 28 patch 0   No current facility-administered medications for this visit.    Facility-Administered Medications Ordered in Other Visits  Medication Dose Route Frequency Provider Last Rate Last Dose  . Darbepoetin Alfa (ARANESP) injection 100 mcg  100 mcg Subcutaneous Q30 days Sindy Guadeloupe, MD   100 mcg at 07/25/17 0924  . Darbepoetin Alfa (ARANESP) injection 100 mcg  100 mcg Subcutaneous Q30 days Sindy Guadeloupe, MD   100 mcg at 08/22/17 1015  . epoetin alfa (EPOGEN,PROCRIT) injection 40,000 Units  40,000 Units Subcutaneous Weekly Sindy Guadeloupe, MD   40,000 Units at 05/23/17 1145    Review of Systems Review of Systems  Constitutional: Negative.   Respiratory: Negative.   Cardiovascular: Negative.     Blood pressure 138/72, pulse 82, resp. rate 18, height 6' (1.829 m), weight 181 lb (82.1 kg), SpO2 98 %.  Physical Exam Physical Exam  Constitutional: He is oriented to person, place, and time. He appears well-developed and well-nourished.  Genitourinary:     Genitourinary Comments: 2 x 4.5 cm open clean wound  Neurological: He is alert and oriented to person, place, and time.  Skin: Skin is warm and dry.  Psychiatric: His behavior is  normal.    Data Reviewed Wound previously measured 2 x 6 cm.  Assessment    Steady improvement in right gluteal wound post excision basal cell carcinoma with clear margins.  Post radiation therapy.    Plan    Follow up in 2 months The patient is aware to call back for any questions or concerns.      HPI, Physical Exam, Assessment and Plan have been scribed under the direction and in the presence of Robert Bellow, MD. Karie Fetch, RN  I have completed the exam and reviewed the above documentation for accuracy and completeness.  I agree with the above.  Haematologist has been used and any errors in dictation or transcription  are unintentional.  Hervey Ard, M.D., F.A.C.S.   Forest Gleason Charvez [place]ies 03/11/2018, 10:06 AM

## 2018-03-13 ENCOUNTER — Ambulatory Visit: Payer: Medicare HMO

## 2018-03-13 ENCOUNTER — Other Ambulatory Visit: Payer: Medicare HMO

## 2018-03-13 ENCOUNTER — Encounter: Payer: Medicare HMO | Attending: Physician Assistant | Admitting: Physician Assistant

## 2018-03-13 ENCOUNTER — Ambulatory Visit: Payer: Medicare HMO | Admitting: Oncology

## 2018-03-13 DIAGNOSIS — Z7984 Long term (current) use of oral hypoglycemic drugs: Secondary | ICD-10-CM | POA: Diagnosis not present

## 2018-03-13 DIAGNOSIS — E1140 Type 2 diabetes mellitus with diabetic neuropathy, unspecified: Secondary | ICD-10-CM | POA: Diagnosis not present

## 2018-03-13 DIAGNOSIS — F17210 Nicotine dependence, cigarettes, uncomplicated: Secondary | ICD-10-CM | POA: Diagnosis not present

## 2018-03-13 DIAGNOSIS — L97522 Non-pressure chronic ulcer of other part of left foot with fat layer exposed: Secondary | ICD-10-CM | POA: Insufficient documentation

## 2018-03-13 DIAGNOSIS — L97512 Non-pressure chronic ulcer of other part of right foot with fat layer exposed: Secondary | ICD-10-CM | POA: Insufficient documentation

## 2018-03-13 DIAGNOSIS — E11621 Type 2 diabetes mellitus with foot ulcer: Secondary | ICD-10-CM | POA: Insufficient documentation

## 2018-03-13 DIAGNOSIS — I890 Lymphedema, not elsewhere classified: Secondary | ICD-10-CM | POA: Insufficient documentation

## 2018-03-13 DIAGNOSIS — Z85828 Personal history of other malignant neoplasm of skin: Secondary | ICD-10-CM | POA: Diagnosis not present

## 2018-03-13 DIAGNOSIS — I10 Essential (primary) hypertension: Secondary | ICD-10-CM | POA: Insufficient documentation

## 2018-03-20 NOTE — Progress Notes (Signed)
BRITTNEY, CARAWAY ([id number]) Visit Report for 03/13/2018 Arrival Information Details Patient Name: Westrich, LUCILLE CRICHLOW. Date of Service: 03/13/2018 8:30 AM Medical Record Number: [id number] Patient Account Number: [id number] Date of Birth/Sex: 1950-06-11 (67 y.o. Male) Treating RN: Montey Hora Primary Care Zondra Lawlor: Lamonte Sakai Other Clinician: Referring Timo Hartwig: Lamonte Sakai Treating Khalee Mazo/Extender: Melburn Hake, HOYT Weeks in Treatment: 79 Visit Information History Since Last Visit Added or deleted any medications: No Patient Arrived: Ambulatory Any new allergies or adverse reactions: No Arrival Time: 08:32 Had a fall or experienced change in No Accompanied By: brother in activities of daily living that may affect law risk of falls: Transfer Assistance: None Signs or symptoms of abuse/neglect since last visito No Patient Identification Verified: Yes Hospitalized since last visit: No Secondary Verification Process Completed: Yes Implantable device outside of the clinic excluding No Patient Requires Transmission-Based No cellular tissue based products placed in the center Precautions: since last visit: Patient Has Alerts: Yes Has Dressing in Place as Prescribed: Yes Patient Alerts: DMII Pain Present Now: No Electronic Signature(s) Signed: 03/13/2018 1:06:46 PM By: Lorine Bears RCP, RRT, CHT Entered By: Lorine Bears on 03/13/2018 08:33:21 Ringley, [name] ([id number]) -------------------------------------------------------------------------------- Clinic Level of Care Assessment Details Patient Name: Griffing, Brett E. Date of Service: 03/13/2018 8:30 AM Medical Record Number: [id number] Patient Account Number: [id number] Date of Birth/Sex: [birth year] (67 y.o. Male) Treating RN: Harold Barban Primary Care Zyheir Daft: Lamonte Sakai Other Clinician: Referring Kelyn Koskela: Lamonte Sakai Treating Legaci Tarman/Extender: Melburn Hake, HOYT Weeks in Treatment:  64 Clinic Level of Care Assessment Items TOOL 4 Quantity Score []  - Use when only an EandM is performed on FOLLOW-UP visit 0 ASSESSMENTS - Nursing Assessment / Reassessment X - Reassessment of Co-morbidities (includes updates in patient status) 1 10 X- 1 5 Reassessment of Adherence to Treatment Plan ASSESSMENTS - Wound and Skin Assessment / Reassessment []  - Simple Wound Assessment / Reassessment - one wound 0 X- 4 5 Complex Wound Assessment / Reassessment - multiple wounds []  - 0 Dermatologic / Skin Assessment (not related to wound area) ASSESSMENTS - Focused Assessment []  - Circumferential Edema Measurements - multi extremities 0 []  - 0 Nutritional Assessment / Counseling / Intervention []  - 0 Lower Extremity Assessment (monofilament, tuning fork, pulses) []  - 0 Peripheral Arterial Disease Assessment (using hand held doppler) ASSESSMENTS - Ostomy and/or Continence Assessment and Care []  - Incontinence Assessment and Management 0 []  - 0 Ostomy Care Assessment and Management (repouching, etc.) PROCESS - Coordination of Care X - Simple Patient / Family Education for ongoing care 1 15 []  - 0 Complex (extensive) Patient / Family Education for ongoing care X- 1 10 Staff obtains Programmer, systems, Records, Test Results / Process Orders []  - 0 Staff telephones HHA, Nursing Homes / Clarify orders / etc []  - 0 Routine Transfer to another Facility (non-emergent condition) []  - 0 Routine Hospital Admission (non-emergent condition) []  - 0 New Admissions / Biomedical engineer / Ordering NPWT, Apligraf, etc. []  - 0 Emergency Hospital Admission (emergent condition) X- 1 10 Simple Discharge Coordination Highland, Braydyn E. ([id number]) []  - 0 Complex (extensive) Discharge Coordination PROCESS - Special Needs []  - Pediatric / Minor Patient Management 0 []  - 0 Isolation Patient Management []  - 0 Hearing / Language / Visual special needs []  - 0 Assessment of Community assistance  (transportation, D/C planning, etc.) []  - 0 Additional assistance / Altered mentation []  - 0 Support Surface(s) Assessment (bed, cushion, seat, etc.) INTERVENTIONS - Wound Cleansing / Measurement []  - Simple Wound Cleansing -  one wound 0 X- 4 5 Complex Wound Cleansing - multiple wounds X- 1 5 Wound Imaging (photographs - any number of wounds) []  - 0 Wound Tracing (instead of photographs) []  - 0 Simple Wound Measurement - one wound X- 4 5 Complex Wound Measurement - multiple wounds INTERVENTIONS - Wound Dressings X - Small Wound Dressing one or multiple wounds 4 10 []  - 0 Medium Wound Dressing one or multiple wounds []  - 0 Large Wound Dressing one or multiple wounds []  - 0 Application of Medications - topical []  - 0 Application of Medications - injection INTERVENTIONS - Miscellaneous []  - External ear exam 0 []  - 0 Specimen Collection (cultures, biopsies, blood, body fluids, etc.) []  - 0 Specimen(s) / Culture(s) sent or taken to Lab for analysis []  - 0 Patient Transfer (multiple staff / Civil Service fast streamer / Similar devices) []  - 0 Simple Staple / Suture removal (25 or less) []  - 0 Complex Staple / Suture removal (26 or more) []  - 0 Hypo / Hyperglycemic Management (close monitor of Blood Glucose) []  - 0 Ankle / Brachial Index (ABI) - do not check if billed separately X- 1 5 Vital Signs Lemanski, Ashton E. ([id number]) Has the patient been seen at the hospital within the last three years: Yes Total Score: 160 Level Of Care: New/Established - Level 5 Electronic Signature(s) Signed: 03/18/2018 5:05:35 PM By: Harold Barban Entered By: Harold Barban on 03/13/2018 09:00:14 Clavijo, [name] ([id number]) -------------------------------------------------------------------------------- Encounter Discharge Information Details Patient Name: Flewellen, Calton E. Date of Service: 03/13/2018 8:30 AM Medical Record Number: [id number] Patient Account Number: [id number] Date of Birth/Sex:  Aug 20, 1950 (67 y.o. Male) Treating RN: Cornell Barman Primary Care Donnamarie Shankles: Lamonte Sakai Other Clinician: Referring Amere Iott: Lamonte Sakai Treating Kamera Dubas/Extender: Melburn Hake, HOYT Weeks in Treatment: 99 Encounter Discharge Information Items Discharge Condition: Stable Ambulatory Status: Ambulatory Discharge Destination: Home Transportation: Private Auto Accompanied By: family Schedule Follow-up Appointment: Yes Clinical Summary of Care: Electronic Signature(s) Signed: 03/13/2018 5:33:18 PM By: Gretta Cool, BSN, RN, CWS, Kim RN, BSN Entered By: Gretta Cool, BSN, RN, CWS, Kim on 03/13/2018 10:37:25 Morash, [name] ([id number]) -------------------------------------------------------------------------------- Lower Extremity Assessment Details Patient Name: Berent, Mackie E. Date of Service: 03/13/2018 8:30 AM Medical Record Number: [id number] Patient Account Number: [id number] Date of Birth/Sex: 04-05-1951 (67 y.o. Male) Treating RN: Montey Hora Primary Care Kyngston Pickelsimer: Lamonte Sakai Other Clinician: Referring Elysia Grand: Lamonte Sakai Treating Silvestre Mines/Extender: Melburn Hake, HOYT Weeks in Treatment: 14 Vascular Assessment Pulses: Dorsalis Pedis Palpable: [Left:Yes] [Right:Yes] Posterior Tibial Extremity colors, hair growth, and conditions: Extremity Color: [Left:Hyperpigmented] [Right:Hyperpigmented] Hair Growth on Extremity: [Left:No] [Right:No] Temperature of Extremity: [Left:Warm] [Right:Warm] Capillary Refill: [Left:< 3 seconds] [Right:< 3 seconds] Electronic Signature(s) Signed: 03/13/2018 5:28:33 PM By: Montey Hora Entered By: Montey Hora on 03/13/2018 08:44:57 Zurcher, Anwar E. ([id number]) -------------------------------------------------------------------------------- Multi Wound Chart Details Patient Name: Monterroso, Aul E. Date of Service: 03/13/2018 8:30 AM Medical Record Number: [id number] Patient Account Number: [id number] Date of Birth/Sex: 1950-08-01 (67 y.o. Male) Treating RN:  Harold Barban Primary Care Raistlin Gum: Lamonte Sakai Other Clinician: Referring Lorna Strother: Lamonte Sakai Treating Nehal Witting/Extender: Melburn Hake, HOYT Weeks in Treatment: 25 Vital Signs Height(in): 69 Pulse(bpm): 71 Weight(lbs): 168 Blood Pressure(mmHg): 163/78 Body Mass Index(BMI): 25 Temperature(F): 98.1 Respiratory Rate 16 (breaths/min): Photos: Wound Location: Left Toe Second Right Toe Great - Dorsal Right Metatarsal head second - Plantar, Distal Wounding Event: Gradually Appeared Not Known Trauma Primary Etiology: Diabetic Wound/Ulcer of 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 Lower Extremity Lower Extremity Comorbid History: Anemia, Lymphedema, Anemia,  Lymphedema, Anemia, Lymphedema, Congestive Heart Failure, Congestive Heart Failure, Congestive Heart Failure, Hypertension, Peripheral Hypertension, Peripheral Hypertension, Peripheral Venous Disease, Type II Venous Disease, Type II Venous Disease, Type II Diabetes, Neuropathy Diabetes, Neuropathy Diabetes, Neuropathy Date Acquired: 09/26/2017 10/29/2017 02/03/2018 Weeks of Treatment: 24 19 5  Wound Status: Open Open Open Pending Amputation on No No No Presentation: Measurements L x W x D 0.1x0.1x0.1 0.6x0.7x0.1 1.2x0.2x0.2 (cm) Area (cm) : 0.008 0.33 0.188 Volume (cm) : 0.001 0.033 0.038 % Reduction in Area: 99.70% 79.40% 46.70% % Reduction in Volume: 99.60% 79.40% -8.60% Classification: Grade 1 Grade 1 Grade 1 Exudate Amount: Medium Medium Medium Exudate Type: Serous Serous Serous Exudate Color: amber amber amber Wound Margin: Flat and Intact Flat and Intact Flat and Intact Granulation Amount: None Present (0%) Large (67-100%) Large (67-100%) Granulation Quality: N/A Red Pink Necrotic Amount: None Present (0%) None Present (0%) None Present (0%) Eugenio, Mylz E. ([id number]) Exposed Structures: Fat Layer (Subcutaneous Fat Layer (Subcutaneous Fascia: No Tissue) Exposed: Yes Tissue)  Exposed: Yes Fat Layer (Subcutaneous Fascia: No Fascia: No Tissue) Exposed: No Tendon: No Tendon: No Tendon: No Muscle: No Muscle: No Muscle: No Joint: No Joint: No Joint: No Bone: No Bone: No Bone: No Epithelialization: Large (67-100%) None None Periwound Skin Texture: Excoriation: No Excoriation: No Excoriation: No Induration: No Induration: No Induration: No Callus: No Callus: No Callus: No Crepitus: No Crepitus: No Crepitus: No Rash: No Rash: No Rash: No Scarring: No Scarring: No Scarring: No Periwound Skin Moisture: Maceration: No Maceration: No Maceration: No Dry/Scaly: No Dry/Scaly: No Dry/Scaly: No Periwound Skin Color: Atrophie Blanche: No Atrophie Blanche: No Atrophie Blanche: No Cyanosis: No Cyanosis: No Cyanosis: No Ecchymosis: No Ecchymosis: No Ecchymosis: No Erythema: No Erythema: No Erythema: No Hemosiderin Staining: No Hemosiderin Staining: No Hemosiderin Staining: No Mottled: No Mottled: No Mottled: No Pallor: No Pallor: No Pallor: No Rubor: No Rubor: No Rubor: No Temperature: N/A No Abnormality No Abnormality Tenderness on Palpation: No No No Wound Preparation: Ulcer Cleansing: Ulcer Cleansing: Ulcer Cleansing: Rinsed/Irrigated with Saline Rinsed/Irrigated with Saline Rinsed/Irrigated with Saline Topical Anesthetic Applied: Topical Anesthetic Applied: Topical Anesthetic Applied: None None Other: lidocaine 4% Wound Number: 2 5 N/A Photos: N/A Wound Location: Right Toe Second Left Toe Great N/A Wounding Event: Gradually Appeared Gradually Appeared N/A 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 ([id number]) Volume (cm) : 0.026 0 N/A % Reduction in Area: 91.60% 100.00% N/A % Reduction in Volume: 91.50% 100.00% N/A Classification: Grade 2 Grade 2 N/A Exudate Amount: Medium N/A N/A Exudate Type: Serous N/A N/A Exudate Color: amber N/A N/A Wound Margin: Flat and Intact N/A N/A Granulation Amount: Large (67-100%) N/A N/A Granulation Quality: Pink N/A N/A Necrotic Amount: None Present (0%) N/A N/A Exposed Structures: Fat Layer (Subcutaneous N/A N/A Tissue) Exposed: Yes Fascia: No Tendon: No Muscle: No Joint: No Bone: No Epithelialization: None N/A N/A Periwound Skin Texture: Excoriation: No No Abnormalities Noted N/A Induration: No Callus: No Crepitus: No Rash: No Scarring: No Periwound Skin Moisture: Maceration: No No Abnormalities Noted N/A Dry/Scaly: No Periwound Skin Color: Atrophie Blanche: No No Abnormalities Noted N/A Cyanosis: No  Ecchymosis: No Erythema: No Hemosiderin Staining: No Mottled: No Pallor: No Rubor: No Temperature: No Abnormality N/A N/A Tenderness on Palpation: No No N/A Wound Preparation: Ulcer Cleansing: N/A N/A Rinsed/Irrigated with Saline Topical Anesthetic Applied: None Treatment Notes Electronic Signature(s) Signed: 03/18/2018 5:05:35 PM By: Harold Barban Entered By: Harold Barban on 03/13/2018 08:55:19 Gadway, [name] ([id number]) -------------------------------------------------------------------------------- [place] Details Patient Name: Bachand, Corney E. Date of Service: 03/13/2018 8:30 AM Medical Record Number: [id number] Patient Account Number: [id number] Date of Birth/Sex: 1950/10/16 (67 y.o. Male) Treating RN: Harold Barban Primary Care Dula Havlik: Lamonte Sakai Other Clinician: Referring Fina Heizer: Lamonte Sakai Treating Ercie Eliasen/Extender: Melburn Hake,  HOYT Weeks in Treatment: 24 Active Inactive Abuse / Safety / Falls / Self Care Management Nursing Diagnoses: Potential for falls Goals: Patient will remain injury free related to falls Date Initiated: 09/21/2016 Target Resolution Date: 09/14/2017 Goal Status: Active Interventions: Assess fall risk on admission and as needed Notes: Nutrition Nursing Diagnoses: Potential for alteratiion in Nutrition/Potential for imbalanced nutrition Goals: Patient/caregiver agrees to and verbalizes understanding of need to use nutritional supplements and/or vitamins as prescribed Date Initiated: 09/21/2016 Target Resolution Date: 09/14/2017 Goal Status: Active Interventions: Assess patient nutrition upon admission and as needed per policy Notes: Orientation to the Wound Care Program Nursing Diagnoses: Knowledge deficit related to the wound healing center program Goals: Patient/caregiver will verbalize understanding of the [place] Program Date Initiated: 09/21/2016 Target Resolution Date: 06/15/2017 Goal Status: Active Interventions: Provide education on orientation to the wound center Porro, Cobi E. ([id number]) Notes: Wound/Skin Impairment Nursing Diagnoses: Knowledge deficit related to smoking impact on wound healing Goals: Ulcer/skin breakdown will have a volume reduction of 30% by week 4 Date Initiated: 09/21/2016 Target Resolution Date: 08/17/2017 Goal Status: Active Ulcer/skin breakdown will have a volume reduction of 50% by week 8 Date Initiated: 09/21/2016 Target Resolution Date: 08/17/2017 Goal Status: Active Ulcer/skin breakdown will have a volume reduction of 80% by week 12 Date Initiated: 09/21/2016 Target Resolution Date: 09/14/2017 Goal Status: Active Ulcer/skin breakdown will heal within 14 weeks Date Initiated: 09/21/2016 Target Resolution Date: 08/17/2017 Goal Status: Active Interventions: Assess patient/caregiver ability to obtain necessary supplies Assess  patient/caregiver ability to perform ulcer/skin care regimen upon admission and as needed Assess ulceration(s) every visit Notes: Electronic Signature(s) Signed: 03/18/2018 5:05:35 PM By: Harold Barban Entered By: Harold Barban on 03/13/2018 08:55:08 Vasek, Issac E. ([id number]) -------------------------------------------------------------------------------- Pain Assessment Details Patient Name: Towell, Sione E. Date of Service: 03/13/2018 8:30 AM Medical Record Number: [id number] Patient Account Number: [id number] Date of Birth/Sex: [birth year] (67 y.o. Male) Treating RN: Montey Hora Primary Care Nahomi Hegner: Lamonte Sakai Other Clinician: Referring Jimesha Rising: Lamonte Sakai Treating Dishawn Bhargava/Extender: Melburn Hake, HOYT Weeks in Treatment: 94 Active Problems Location of Pain Severity and Description of Pain Patient Has Paino No Site Locations Pain Management and Medication Current Pain Management: Electronic Signature(s) Signed: 03/13/2018 1:06:46 PM By: Paulla Fore, RRT, CHT Signed: 03/13/2018 5:28:33 PM By: Montey Hora Entered By: Lorine Bears on 03/13/2018 08:33:28 Mayall, [name] ([id number]) -------------------------------------------------------------------------------- Patient/Caregiver Education Details Patient Name: Misenheimer, Hisham E. Date of Service: 03/13/2018 8:30 AM Medical Record Number: [id number] Patient Account Number: [id number] Date of Birth/Gender: April 03, 1951 (67 y.o. Male) Treating RN: Harold Barban Primary Care Physician: Lamonte Sakai Other Clinician: Referring Physician: Lamonte Sakai Treating Physician/Extender: Sharalyn Ink in Treatment: 41 Education Assessment Education Provided To: Patient Education Topics Provided Wound/Skin Impairment: Handouts: Caring for Your Ulcer Methods: Demonstration, Explain/Verbal Responses: State content correctly Electronic Signature(s) Signed: 03/18/2018 5:05:35 PM By: Oretha Ellis,  Carmell Austria Entered By: Harold Barban on 03/13/2018 09:00:47 Scifres, [name] ([id number]) -------------------------------------------------------------------------------- Wound Assessment Details Patient Name: Rhew, YASHUA BRACCO. Date of Service: 03/13/2018 8:30 AM Medical Record Number: [id number] Patient Account Number: [id number] Date of Birth/Sex: 22-Jun-1950 (67 y.o. Male) Treating RN: Montey Hora Primary Care Sokha Craker: Lamonte Sakai Other Clinician: Referring Cletus Mehlhoff: Lamonte Sakai Treating Kadance Mccuistion/Extender: Melburn Hake, HOYT Weeks in Treatment: 34 Wound Status Wound Number: 14 Primary Diabetic Wound/Ulcer of the Lower Extremity Etiology: Wound Location: Left Toe Second Wound Open Wounding Event: Gradually Appeared Status: Date Acquired: 09/26/2017 Comorbid Anemia, Lymphedema, Congestive Heart Weeks Of Treatment: 24 History: Failure, Hypertension, Peripheral Venous Clustered Wound: No Disease, Type II Diabetes, Neuropathy Photos Photo Uploaded By: Montey Hora on 03/13/2018 08:51:16 Wound Measurements Length: (cm) 0.1 Width: (cm) 0.1 Depth: (cm) 0.1 Area: (cm) 0.008 Volume: (cm) 0.001 % Reduction in Area: 99.7% % Reduction in Volume: 99.6% Epithelialization: Large (67-100%) Tunneling: No Undermining: No Wound Description Classification: Grade 1 Wound Margin: Flat and Intact Exudate Amount: Medium Exudate Type: Serous Exudate Color: amber Foul Odor After Cleansing: No Slough/Fibrino Yes Wound Bed Granulation Amount: None Present (0%) Exposed Structure Necrotic Amount: None Present (0%) Fascia Exposed: No Fat Layer (Subcutaneous Tissue) Exposed: Yes Tendon Exposed: No Muscle Exposed: No Joint Exposed: No Bone Exposed: No Periwound Skin Texture Bewick, Jaideep E. ([id number]) Texture Color No Abnormalities Noted: No No Abnormalities Noted: No Callus: No Atrophie Blanche: No Crepitus: No Cyanosis: No Excoriation: No Ecchymosis: No Induration:  No Erythema: No Rash: No Hemosiderin Staining: No Scarring: No Mottled: No Pallor: No Moisture Rubor: No No Abnormalities Noted: No Dry / Scaly: No Maceration: No Wound Preparation Ulcer Cleansing: Rinsed/Irrigated with Saline Topical Anesthetic Applied: None Treatment Notes Wound #14 (Left Toe Second) Notes silvercel secured with tape Electronic Signature(s) Signed: 03/13/2018 5:28:33 PM By: Montey Hora Entered By: Montey Hora on 03/13/2018 08:40:55 Roesler, [name] ([id number]) -------------------------------------------------------------------------------- Wound Assessment Details Patient Name: Kaus, Terrance E. Date of Service: 03/13/2018 8:30 AM Medical Record Number: [id number] Patient Account Number: [id number] Date of Birth/Sex: Dec 19, 1950 (67 y.o. Male) Treating RN: Montey Hora Primary Care Numa Schroeter: Lamonte Sakai Other Clinician: Referring Malky Rudzinski: Lamonte Sakai Treating Luise Yamamoto/Extender: Melburn Hake, HOYT Weeks in Treatment: 28 Wound Status Wound Number: 16 Primary Diabetic Wound/Ulcer of the Lower Extremity Etiology: Wound Location: Right Toe Great - Dorsal Wound Open Wounding Event: Not Known Status: Date Acquired: 10/29/2017 Comorbid Anemia, Lymphedema, Congestive Heart Weeks Of Treatment: 19 History: Failure, Hypertension, Peripheral Venous Clustered Wound: No Disease, Type II Diabetes, Neuropathy Photos Photo Uploaded By: Montey Hora on 03/13/2018 08:51:16 Wound Measurements Length: (cm) 0.6 Width: (cm) 0.7 Depth: (cm) 0.1 Area: (cm) 0.33 Volume: (cm) 0.033 % Reduction in Area: 79.4% % Reduction in Volume: 79.4% Epithelialization: None Tunneling: No Undermining: No Wound Description Classification: Grade 1 Wound Margin: Flat and Intact Exudate Amount: Medium Exudate Type: Serous Exudate Color: amber Foul Odor After Cleansing: No Slough/Fibrino Yes Wound Bed Granulation Amount: Large (67-100%) Exposed Structure Granulation  Quality: Red Fascia Exposed: No Necrotic Amount: None Present (0%) Fat Layer (Subcutaneous Tissue) Exposed: Yes Tendon Exposed: No Muscle Exposed: No Joint Exposed: No Bone Exposed: No Periwound Skin Texture Morlock, Grier E. ([id number]) Texture Color No Abnormalities Noted: No No Abnormalities Noted: No Callus: No Atrophie Blanche: No Crepitus: No Cyanosis: No Excoriation: No Ecchymosis: No Induration: No Erythema: No Rash: No Hemosiderin Staining: No Scarring: No Mottled: No Pallor: No Moisture Rubor: No No Abnormalities Noted: No Dry / Scaly: No Temperature / Pain Maceration: No Temperature: No Abnormality Wound Preparation  Ulcer Cleansing: Rinsed/Irrigated with Saline Topical Anesthetic Applied: None Electronic Signature(s) Signed: 03/13/2018 5:28:33 PM By: Montey Hora Entered By: Montey Hora on 03/13/2018 08:41:46 Bagshaw, [name] ([id number]) -------------------------------------------------------------------------------- Wound Assessment Details Patient Name: Paddock, Terik E. Date of Service: 03/13/2018 8:30 AM Medical Record Number: [id number] Patient Account Number: [id number] Date of Birth/Sex: 11-04-1950 (67 y.o. Male) Treating RN: Montey Hora Primary Care Miliani Deike: Lamonte Sakai Other Clinician: Referring Madisan Bice: Lamonte Sakai Treating Jatavious Peppard/Extender: Melburn Hake, HOYT Weeks in Treatment: 43 Wound Status Wound Number: 17 Primary Diabetic Wound/Ulcer of the Lower Extremity Etiology: Wound Location: Right Metatarsal head second - Plantar, Distal Wound Open Status: Wounding Event: Trauma Comorbid Anemia, Lymphedema, Congestive Heart Date Acquired: 02/03/2018 History: Failure, Hypertension, Peripheral Venous Weeks Of Treatment: 5 Disease, Type II Diabetes, Neuropathy Clustered Wound: No Photos Photo Uploaded By: Montey Hora on 03/13/2018 08:54:41 Wound Measurements Length: (cm) 1.2 Width: (cm) 0.2 Depth: (cm) 0.2 Area: (cm)  0.188 Volume: (cm) 0.038 % Reduction in Area: 46.7% % Reduction in Volume: -8.6% Epithelialization: None Tunneling: No Undermining: No Wound Description Classification: Grade 1 Wound Margin: Flat and Intact Exudate Amount: Medium Exudate Type: Serous Exudate Color: amber Foul Odor After Cleansing: No Slough/Fibrino No Wound Bed Granulation Amount: Large (67-100%) Exposed Structure Granulation Quality: Pink Fascia Exposed: No Necrotic Amount: None Present (0%) Fat Layer (Subcutaneous Tissue) Exposed: No Tendon Exposed: No Muscle Exposed: No Joint Exposed: No Bone Exposed: No Periwound Skin Texture Karle, Terrence E. ([id number]) Texture Color No Abnormalities Noted: No No Abnormalities Noted: No Callus: No Atrophie Blanche: No Crepitus: No Cyanosis: No Excoriation: No Ecchymosis: No Induration: No Erythema: No Rash: No Hemosiderin Staining: No Scarring: No Mottled: No Pallor: No Moisture Rubor: No No Abnormalities Noted: No Dry / Scaly: No Temperature / Pain Maceration: No Temperature: No Abnormality Wound Preparation Ulcer Cleansing: Rinsed/Irrigated with Saline Topical Anesthetic Applied: Other: lidocaine 4%, Treatment Notes Wound #17 (Right, Distal, Plantar Metatarsal head second) Notes silvercel secured with tape Electronic Signature(s) Signed: 03/13/2018 5:28:33 PM By: Montey Hora Entered By: Montey Hora on 03/13/2018 08:43:37 Kinsel, [name] ([id number]) -------------------------------------------------------------------------------- Wound Assessment Details Patient Name: Moffit, Hazael E. Date of Service: 03/13/2018 8:30 AM Medical Record Number: [id number] Patient Account Number: [id number] Date of Birth/Sex: 05-01-1950 (67 y.o. Male) Treating RN: Montey Hora Primary Care Tashunda Vandezande: Lamonte Sakai Other Clinician: Referring Darlene Bartelt: Lamonte Sakai Treating Elgie Maziarz/Extender: Melburn Hake, HOYT Weeks in Treatment: 45 Wound Status Wound Number: 2  Primary Diabetic Wound/Ulcer of the Lower Extremity Etiology: Wound Location: Right Toe Second Wound Open Wounding Event: Gradually Appeared Status: Date Acquired: 06/11/2016 Comorbid Anemia, Lymphedema, Congestive Heart Weeks Of Treatment: 76 History: Failure, Hypertension, Peripheral Venous Clustered Wound: No Disease, Type II Diabetes, Neuropathy Pending Amputation On Presentation Photos Photo Uploaded By: Montey Hora on 03/13/2018 08:54:42 Wound Measurements Length: (cm) 0.3 Width: (cm) 1.1 Depth: (cm) 0.1 Area: (cm) 0.259 Volume: (cm) 0.026 % Reduction in Area: 91.6% % Reduction in Volume: 91.5% Epithelialization: None Tunneling: No Undermining: No Wound Description Classification: Grade 2 Wound Margin: Flat and Intact Exudate Amount: Medium Exudate Type: Serous Exudate Color: amber Foul Odor After Cleansing: No Slough/Fibrino Yes Wound Bed Granulation Amount: Large (67-100%) Exposed Structure Granulation Quality: Pink Fascia Exposed: No Necrotic Amount: None Present (0%) Fat Layer (Subcutaneous Tissue) Exposed: Yes Tendon Exposed: No Muscle Exposed: No Joint Exposed: No Bone Exposed: No Periwound Skin Texture Nokes, Christhoper E. ([id number]) Texture Color No Abnormalities Noted: No No Abnormalities Noted: No Callus: No Atrophie Blanche: No Crepitus: No Cyanosis: No Excoriation: No Ecchymosis: No Induration: No  Erythema: No Rash: No Hemosiderin Staining: No Scarring: No Mottled: No Pallor: No Moisture Rubor: No No Abnormalities Noted: No Dry / Scaly: No Temperature / Pain Maceration: No Temperature: No Abnormality Wound Preparation Ulcer Cleansing: Rinsed/Irrigated with Saline Topical Anesthetic Applied: None Treatment Notes Wound #2 (Right Toe Second) Notes silvercel secured with tape Electronic Signature(s) Signed: 03/13/2018 5:28:33 PM By: Montey Hora Entered By: Montey Hora on 03/13/2018 08:44:00 [place]epinto, [name]  ([id number]) -------------------------------------------------------------------------------- Wound Assessment Details Patient Name: Cage, Jayon E. Date of Service: 03/13/2018 8:30 AM Medical Record Number: [id number] Patient Account Number: [id number] Date of Birth/Sex: 1951-02-06 (67 y.o. Male) Treating RN: Montey Hora Primary Care Brinae Woods: Lamonte Sakai Other Clinician: Referring Taviana Westergren: Lamonte Sakai Treating Jacalynn Buzzell/Extender: Melburn Hake, HOYT Weeks in Treatment: 29 Wound Status Wound Number: 5 Primary Diabetic Wound/Ulcer of the Lower Etiology: Extremity Wound Location: Left Toe Great Wound Status: Healed - Epithelialized Wounding Event: Gradually Appeared Date Acquired: 06/11/2016 Weeks Of Treatment: 76 Clustered Wound: No Pending Amputation On Presentation Photos Photo Uploaded By: Montey Hora on 03/13/2018 08:54:54 Wound Measurements Length: (cm) 0 % Width: (cm) 0 % Depth: (cm) 0 Area: (cm) 0 Volume: (cm) 0 Reduction in Area: 100% Reduction in Volume: 100% Wound Description Classification: Grade 2 Periwound Skin Texture Texture Color No Abnormalities Noted: No No Abnormalities Noted: No Moisture No Abnormalities Noted: No Electronic Signature(s) Signed: 03/13/2018 5:28:33 PM By: Montey Hora Entered By: Montey Hora on 03/13/2018 08:44:38 Stimpson, [name] ([id number]) -------------------------------------------------------------------------------- Vitals Details Patient Name: Murguia, Jamie E. Date of Service: 03/13/2018 8:30 AM Medical Record Number: [id number] Patient Account Number: [id number] Date of Birth/Sex: 1950-08-20 (67 y.o. Male) Treating RN: Montey Hora Primary Care Yessenia Maillet: Lamonte Sakai Other Clinician: Referring Anayiah Howden: Lamonte Sakai Treating Rey Fors/Extender: Melburn Hake, HOYT Weeks in Treatment: 71 Vital Signs Time Taken: 08:33 Temperature (F): 98.1 Height (in): 69 Pulse (bpm): 76 Weight (lbs): 168 Respiratory Rate  (breaths/min): 16 Body Mass Index (BMI): 24.8 Blood Pressure (mmHg): 163/78 Reference Range: 80 - 120 mg / dl Electronic Signature(s) Signed: 03/13/2018 1:06:46 PM By: Lorine Bears RCP, RRT, CHT Entered By: Lorine Bears on 03/13/2018 08:37:17

## 2018-03-20 NOTE — Progress Notes (Signed)
ARCHIE, SHEA ([id number]) Visit Report for 03/13/2018 Chief Complaint Document Details Patient Name: [name], [name] Date of Service: 03/13/2018 8:30 AM Medical Record Number: [id number] Patient Account Number: [id number] Date of Birth/Sex: 11-02-1950 (67 y.o. Male) Treating RN: Montey Hora Primary Care Provider: Lamonte Sakai Other Clinician: Referring Provider: Lamonte Sakai Treating Provider/Extender: Melburn Hake, HOYT Weeks in Treatment: 4 Information Obtained from: Patient Chief Complaint Patient presents for treatment of an open diabetic ulcer to both feet Electronic Signature(s) Signed: 03/13/2018 9:49:28 PM By: Worthy Keeler PA-C Entered By: Worthy Keeler on 03/13/2018 08:52:35 Fodge, [name] ([id number]) -------------------------------------------------------------------------------- HPI Details Patient Name: [name], [name] Date of Service: 03/13/2018 8:30 AM Medical Record Number: [id number] Patient Account Number: [id number] Date of Birth/Sex: 1951/03/18 (67 y.o. Male) Treating RN: Montey Hora Primary Care Provider: Lamonte Sakai Other Clinician: Referring Provider: Lamonte Sakai Treating Provider/Extender: Melburn Hake, HOYT Weeks in Treatment: 81 History of Present Illness Location: bilateral feet ulceration on the toes Quality: Patient reports experiencing a dull pain to affected area(s). Severity: Patient states wound are getting better Duration: Patient has had the wound for > 3 months prior to seeking treatment at the wound center Timing: Pain in wound is constant (hurts all the time) Context: The wound would happen gradually Modifying Factors: Other treatment(s) tried include:treatment for lymphedema and is seen by the podiatrist Dr. Caryl Comes Associated Signs and Symptoms: Patient reports having increase swelling. HPI Description: 67 year old patient here to see as for bilateral feet ulceration to on his left first and second toe and 2 on his right first and second  toe, which she's had for about 4 months. 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 Donella Stade. The patient is also undergoing workup by medical oncology for a intra-abdominal lymphadenopathy. 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 He is also status post appendectomy, inguinal lymph node biopsy, rectal biopsy and rectal examination under anesthesia. he currently smokes cigarettes about half packet a day. In March of this year he was seen by Dr. Hortencia Pilar, for evaluation of bilateral varicose veins and besides wearing compression stockings he had recommended laser ablation of the right and left great saphenous veins to eleviate the symptoms and complications of severe superficial venous reflux disease. He also recommended lymphedema pumps for better control of his lymphedema. The patient recently has had on 08/23/2016, right greater saphenous vein ablation with the laser energy Earlier lower extremity venous reflux examination done on 05/08/2016 showed no DVT or SVT both lower legs but incompetence of bilateral great saphenous veins was present. A lower arterial study was also done and there was no significant right lower and left lower extremity problems based on a normal toe brachial index bilaterally and the ABI was 1.21 the left and 1.23 on the right. His post ablation venous duplex examination showed successful ablation of the right GS vein with thrombus formation 2 below the right saphenofemoral junction. The deep system was patent without evidence of thrombosis and this was done on 08/30/2016. the patient also has a squamous cell cancer of the skin of the buttock and is recently undergone radiation therapy for this prior to excisional  surgery. Addendum: regarding his x-rays done today and x-ray of the left foot -- IMPRESSION: No objective evidence of osteomyelitis. There  are soft tissue changes which may reflect cellulitis. X-ray of the right foot -- IMPRESSION:Findings compatible with cellulitis of the toes. No objective evidence of osteomyelitis is observed. 10/01/16 on evaluation today patient's wounds appeared to be doing some better. I did review the x-rays as well which showed no evidence of osteomyelitis although there was evidence on x-ray of cellulitis. He fortunately is not having any discomfort although he continues to have some swelling. He does not remember being on any antibiotics recently. 10/15/16 on evaluation today patient's wounds overall appear to be doing better although he does have a new location noted on the left foot. Fortunately he is not having significant pain. It almost has the appearance that something is rubbing on the end of his toes but he wears the open toe shoes and according to what he is telling me never wears anything that would rub on his foot. There is no evidence of infection and specifically no evidence of a fungal infection 10/22/16 On evaluation today patient's wounds appeared to be doing better compared to last week in regard to his bilateral [name], [name] ([id number]) lower extremities. Fortunately I happy with how things are progressing although he still has ulcers I feel like that he is improving and appropriate manner. 11/12/16 on evaluation today patient appears to be doing well in regard to his bilateral feet and the respective wounds. We have been using surrounding her dressings along with an antifungal cream which seems to be doing very well. He has no bilateral dysfunction noticed that the rituals are weight loss at this point. He also has no nausea or vomiting a note purulent discharge. He did see Vein and vascular today and he tells me that they told him he could have surgery  for his venous stasis but they did not feel like it was worth it in his words. Fortunately patient's wounds do appear to be getting sneakily better. 11/26/2016 -- he says he is going to have some surgery during this week at [facility] for possibly a colon resection. 12/31/2016 -- the patient has been noncompliant with his smoking and I'm not sure whether he is also started drinking again. He continues to be very nonchalant about his care 01/14/2017 -- the patient's HandP has been reviewed well and I understand he is being compliant with trying to give up smoking and his local dressing changes. He does not have any surgical options of 4 to him by his vascular surgeons.he was last seen in early August by Dr. Hortencia Pilar who recommended compression stockings,and possibly lymph pumps in 2-3 months after doing a review ultrasound. 01/28/2017 - the patient did not have any fresh complaints but on examination I noted a large lacerated wound on the plantar aspect of his right fourth toe which had a lot of necrotic debris and it probes down to bone. 02/07/2017 -- x-ray of the right foot -- IMPRESSION: Soft tissue swelling about the first through fourth toes consistent with cellulitis. New destructive change in the tuft of the distal phalanx of the great toe is consistent with osteomyelitis. 02/14/2017 -- the patient's MRI is pending this coming Monday and he still continues to smoke. We have again gone over off loading of his wounds in great detail and he says he's been compliant. 02/21/2017 -- MR of the right foot -- IMPRESSION: 1. Soft tissue ulcer at the tip of the first, second and third toe knows. Cortical irregularity and bone marrow edema in the first distal phalanx most concerning  for osteomyelitis. Mild marrow edema in the second and third distal phalanx without definite cortical destruction which may reflect early osteomyelitis versus reactive marrow edema. 2. Soft tissue edema surrounding  the first phalanx most consistent with cellulitis. the patient was also recently evaluated by his medical oncologist Dr. 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 She is treating him with weekly Procrit. She is also keeping intra-abdominal lymphadenopathy and right lower lobe lung nodule under observation. 04/04/2017 -- he was seen by Dr. Adrian Prows on 03/25/2017 -- after review he empirically put him on ciprofloxacin and doxycycline as they have good bone penetration and good bioavailability and it will cover the usual pathogens and diabetic foot osteomyelitis. He will check inflammatory markers and plan a 20-68 week old records. C-reactive protein was 0.3 and the ESR was 72 04/18/17 on evaluation today patient appears to be doing about the same in regard to his lower extremity wounds bilaterally. He has continued to use the antifungal cream which does seem to be beneficial. Nonetheless the ulcers do seem to in some areas be epithelial eyes over and in other areas are still open. He is having no significant discomfort. 04/25/17-he is here in follow-up evaluation for multiple ulcerations to multiple toes bilaterally. He states he did see Dr. Ola Spurr again last week and continues antibiotic therapy. He is voicing no complaints or concerns, will continue with current treatment plan will possibility of adding compression therapy next week after an additional week of treatment/lotions to BLE prescribed by Dr Ola Spurr 05/02/17 he is here in follow up for for multiple ulcers to multiple toes bilaterally. we will stop using antifungal cream and will continue with silvercel and follow up next week 05/09/17-he is here in follow-up for multiple ulcerations to multiple toes bilaterally. There is improvement in appearance. He has not completely stopped using antifungal cream, but admits he has not using it between the toes. He has an  appointment with Dr. Ola Spurr on 2/11, continues on doxycycline and Cipro. It has been 5 weeks of antibiotic therapy, we will order plain film xray to evaluate for osteomyelitis next week, prior to follow up with ID. Will continue with silvercel and follow up next week 05/16/17-he is here in follow-up evaluation for multiple ulcerations to multiple toes bilaterally and new wound to the right posterior heel. There is essentially no change in appearance, deteriorating measurements; he has a history of waxing and waning measurements. He admits that he continues to apply moisturizer/cream/ointment to his toes despite weekly reminders to only apply silvercel to his toes. He states that he thinks the surgical shoe contributed to the superficial ulcer to his posterior [name], [name] ([id number]) heel, he is unable to articulate if this was an area of dry cracked skin as he has a similar area to the left heel. He now is wearing open toed slippers. He has an appointment with Dr. Ola Spurr on 2/11. We have ordered x-rays for her bilateral feet; he was advised to obtain the x-rays today or tomorrow. He will follow-up next week 05/23/17-he is here in follow-up evaluation for multiple ulcerations to multiple toes bilaterally and the right posterior heel. There is improvement in maceration. He has been compliant and not applying any moisturizing agent to his toes. He has been using Lac-Hydrin for his lower extremities with improvement. He did not go to his appointment on Monday with Dr. Ola Spurr secondary to financial concerns. X-rays for her bilateral feet  showed: LEFT FOOT with slight erosion of the tuft of the distal phalanges of the left first and second toe suspicious for osteomyelitis, RIGHT FOOT with 1.erosion of the tufts of the distal phalanges of the right first second and possibly third toes consistent with osteomyelitis, 2 no definitive abnormality of the calcaneus is seen on the images obtained, 3.  Plantar calcaneal degenerative spur. We briefly discussed hyperbaric adjunctive therapy for treatment of chronic refractory osteomyelitis. I do not find an a1c in EMR, will contact PCP for record, or order if needed. He has been encouraged to contact Dr Ola Spurr office regarding the follow-up appointment, encouraged him to inquire about payment plan. We will continue with same treatment plan and follow-up next week. He states he is still taking antibiotics and has "a lot" left. He states he has been taking them as directed, 2 pills twice daily. According to Dr. Blane Ohara office notes he was originally started on 12/17 for 4 weeks and extended on 1/14 for an additional 4 weeks. He should be done with his antibiotic therapy, he was advised to bring his bottles and to his next appointment, we will contact pharmacy. 05/30/17-he is here in follow-up evaluation for multiple ulcerations to multiple toes bilaterally and the right posterior heel. He is accompanied by his brother-in-law. Wounds are stable. He has yet to make up with Dr. Ola Spurr. We contacted his PCP, with no record of recent A1c we will draw an A1c. His brother-in-law states that he was taken off all of his diabetic medication secondary to kidney function. 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 The antibiotics were counted and have approximately 2 weeks left, although they should be complete. The pill organizer reveals missing days. We discussed the need for consistent medications, to have optimal benefit of medication. He has a cousin that lives with him and he will ask her to check his organizer daily. He has been advised to follow up with Dr Ola Spurr, and will go by the office today. He has been advised to quit smoking. 06/06/17-he is here in follow up evaluation. He has had to make an appointment with Dr. Ola Spurr. He did have  blood work obtained, a1c 5. He continues to take antibiotic therapy. Significant improvement in bilateral lower extremity edema with compression therapy. Essentially no change in ulcerations to toes. He states he is "going to try something different" and "let me know next week" if it works; he would not provide any additional information and was encouraged to follow our orders. We will follow up next week 06/13/17-he is here in follow-up evaluation. He has an appointment with Dr. Ola Spurr tomorrow morning. He states he purchased an ointment from [facility] and applied to his toes for 3 days, he does not remember the name of the ointment. There is improvement to his wounds, minimal maceration. He continues to take antibiotic therapy, this should have been completed last month. His brother-in-law who regularly accompanies his appointments was asked to take the bottles to the appointment tomorrow with Dr. Ola Spurr so he is aware. We will continue with 3 layer compression, and order OPEN TOE compression 20-30mmHg; we will apply compression stockings next week. He continues to smoke, smoked "2 cigarettes" last week 06/20/17 on evaluation today patient did receive his compression stockings which he has with him today for both lower extremities. With that being said he tells me at this point in time that he is very  happy to have these he really is not a big fan of the compression wraps that we have been utilizing although they have been of great benefit for him. Nonetheless at this point he does want to switch to the compression stockings. In my opinion as long as he is continuing with compression I'm okay with the stockings or the wraps. 06/27/17-he is here in follow-up evaluation for multiple ulcerations to his bilateral toes. There is some improvement in appearance. He is compliant in wearing his compression stockings with significant improvement in lower extremity edema. He saw Dr Ola Spurr on 3/8,  per his notes they would redraw ESR and CRP; plan to continue antibiotic therapy if these remain elevated. I do not see an ESR or CRP level in Epic. The patient continues to take antibiotics. 07/11/17-He is here in follow-up evaluation for multiple ulcerations to multiple toes bilaterally. He presents with complete epithelialization to the right third toe; there has been no deterioration. He continues on antibiotic therapy. He will follow-up next week 07/18/17-He is here in follow-up evaluation for multiple ulcerations to multiple toes bilaterally. He continues to make improvement. He continues on antibiotic therapy. He states he has been using something additional to our orders, he does not elaborate but states he will bring it in next week. 07/25/17-He is here in follow up evaluation for multiple ulcerations to bilateral toes. He is stable. He has completed antibiotic therapy. He admits to "filing" his toes after showers each evening, this is what he was referencing last week; he does not filing for the wounds. We will switch to [facility] and monitor for any improvement, he will follow-up next week 08/01/17-He is here in follow-up evaluation. He admits to "picking" at his toes after cleansing yesterday, leading to new areas of tissue loss on the bilateral second toe. There is improvement noted to the bilateral great toe. We will dress toes today and hope that they maintain until Monday where he will come in for a nurse visit. He has been advised, multiple times with expressed verbalization, to change the dressings to silvercel if the dressings get wet prior to [facility] appointment. [name], [name] ([id number]) 08/08/17-He is here in follow-up evaluation for bilateral first and second toe ulcerations. There is significant improvement to all ulcerations since last visit. We will switch to [facility] to all wounds and he will continue with nurse visits on a Monday/Thursday schedule and follow-up  with me in 2 weeks. He continues to smoke, 1-3 cigarettes per day, and has been encouraged to not smoke until his next follow-up in 2 weeks. 08/15/17 on evaluation today patient's ulcers on his toes actually appear to be doing fairly well the right to ulcers may be a little bit more moist compared to the left I'm not really sure exactly why as the openings appear to be very small and I'm not seeing any evidence of anything significant as far as you lightest or otherwise. Nonetheless this may just be a small setback he's been doing very well with the [facility] Dressing 08/22/17-He is here in follow-up evaluation for ulcerations to his bilateral first and second toes, there is small/scant amount of drainage noted on today's dressing. He continues with Hydrofera Blue. He continues to smoke, 1 cigarette a day. Voices no complaint or concerns, compliant and compression therapy today. He'll follow-up next week 08/29/17-He is here in follow-up evaluation for ulcerations to his bilateral first and second toes, with a new wound to the left third toe. He states  he cut himself while cutting the toenail. He presents today with more maceration and increased measurements; waxing and waning measurements and moisture has been an ongoing issue. He continues to smoke a proximally 1 cigarette per day, but states he has not smoked since Sunday. We will initiate medihoney daily and evaluate next week. He presents today without compression stockings 09/05/17-He is here in follow-up evaluation for ulcerations to bilateral first and second and left third toe. He did not pick up the medihoney and therefore has not been changing his dressing. He admits to inconsistent where of compression stockings, admits to pain to his bilateral lower extremities with unilateral edema (right greater than left). The ulcerations to multiple toes are more macerated and larger in size than last week. He is wearing compression therapy today. He  admits to an overall feeling of weakness and fatigue and has been encouraged to contact his PCP for evaluation; he has a known history of anemia and intraabdominal lymphadenopathy. We will return to silvercel and he will follow up next week 09/19/17-He is here in follow-up evaluation for ulcerations to bilateral first and second toe.he was unable to make last week's appointment. The culture that was obtained on 5/30 grew clindamycin sensitive MRSA; he was initiated on 6/4 but did not start it until 6/7. There is significant improvement in all wounds, healing to left second and third toe. He also admits to having no alcohol or smoking for the last 6 days. We will continue with same treatment plan I will extend the clindamycin for an additional 10 days and he will follow-up next week. 09/26/17- He is here in follow evaluation for multiple ulcerations to multiple toes bilaterally. He continues to be non-compliant with dressing, compression therapy. He states he is taking the antibiotics as prescribed, although his recall is inconsistent. There is noted deterioration in all ulcers. We will continue same treatment plan, antibiotic therapy and he will follow up next week. 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 This information is obtained from the medical record, as he cannot articulate specifics to his diagnosis or treatment plan. 10/17/17-He is here in follow-up evaluation for multiple ulcerations to multiple toes bilaterally. There is chronic waxing and waning improvement/deterioration. On today's exam they appear improved, dry without maceration. He continues to apply a variety of topical agents, stating that he has switched between the blue product and silver product. In my opinion, this will be a chronic waxing and waning  giving his intermittent compliance. He will follow-up in 2 weeks. As it relates to the biopsy taken from his buttock, he has not followed up with that and has not started radiation therapy. 10/31/17-He is here for evaluation for multiple ulcerations to multiple toes bilaterally. Continues with waxing and waning improvement versus deterioration. They are stable on today's exam without significant maceration. 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 We will continue with every 2 week follow-up appointments 11/14/17- He is seen in follow-up evaluation for multiple ulcerations to multiple bilateral toes. He is scheduled for excision of basal cell carcinoma to his right buttock on Monday 8/12. He continues to have chronic waxing and waning of his ulcers; today is a stable day with minimal drainage and left ear is better than right toes. I will recount to infectious disease regarding chronic suppressive  therapy. He admits to being more compliant with compression stockings, although he is not wearing any today. He has been encouraged to continue with dressing changes as ordered and compression therapy daily. He will be seen in 2 weeks 11/28/17-He is seen in follow up evaluation for multiple ulcerations to multiple toes bilaterally. He had an overnight admission at [facility] (8/11-8/12) for symptomatic anemia, received two units prbc. His surgical date has been moved to 9/94 excision of basal cell carcinoma to the right buttock. He has been noncompliant in wearing compression stockings, applying topical treatment as ordered; he has been applying a "cream", Hydrofera Blue, silvercel. He is currently on no antibiotic therapy; we will send for re-referral to ID for suppressive therapy given his multiple co-morbidities, poor surgical candidate, unwilling to have toe amputation and general non-adherence. 12/19/17 on evaluation today patient actually appears to be doing very  well in regard to his toes at least compared to last time I saw him. He again has been coming to our office for quite a bit of time. 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 He states is hurting a little bit but doing well in general. I'm see were not taking care of this area dermatology is. He does have his appointment with infectious disease upcoming which they re-consult for suppressive therapy that's next Monday. OLUWATOBI, [name] ([id number]) 01/02/18 on evaluation today patient actually appears to be doing rather well in regard to his toe ulcers although he tells me he has been put in an ointment on this which I am concerned may be causing some additional moisture buildup which we really do not want. He supposed be using silver cell which is mainly thing I want him to be utilizing. Fortunately there does not appear to be any evidence of worsening infection which is good news. He has had surgery on his gluteal region where he had an excision of a cancerous area fortunately that seems to be doing well although he is having some discomfort. 02/06/18 on evaluation today patient actually appears to be doing well at all locations except for his second toe use bilaterally where the distal tip is still wrong open. He did state that he can't stand for dry skin to buildup on his toes subsequently as a question and further it sounds as if he actually pills this all which is likely keeping the toe is wrong and open. Also think that's what happened in the bottom of his foot although that appears to have healed at this point. There does not appear to be any evidence of infection. 02/20/18 on evaluation today patient appears to be doing better in regard to his foot ulcers. Everything has shown signs of improvement which is excellent news. Overall I'm very pleased with how things appear currently. The  patient states he's not picking her point at any skin and that he wears shoes at all times when he is ambulating at home. Both are things that I've advocated and recommended for him in the past. 03/13/18 on evaluation today patient actually appears to be doing very well in regard to the bilateral toes in the overall appearance of the wound bed locations. Fortunately there does not appear to be any signs of infection at this time. He states that he is very pleased with how things seem to be progressing to be honest as am I. Electronic Signature(s) Signed: 03/18/2018  8:14:55 AM By: Worthy Keeler PA-C Previous Signature: 03/13/2018 9:49:28 PM Version By: Worthy Keeler PA-C Entered By: Worthy Keeler on 03/18/2018 08:12:15 Hult, [name] ([id number]) -------------------------------------------------------------------------------- Physical Exam Details Patient Name: Furness, Katai E. Date of Service: 03/13/2018 8:30 AM Medical Record Number: [id number] Patient Account Number: [id number] Date of Birth/Sex: Nov 15, 1950 (67 y.o. Male) Treating RN: Montey Hora Primary Care Provider: Lamonte Sakai Other Clinician: Referring Provider: Lamonte Sakai Treating Provider/Extender: Melburn Hake, HOYT Weeks in Treatment: 1 Constitutional Well-nourished and well-hydrated in no acute distress. Respiratory normal breathing without difficulty. clear to auscultation bilaterally. Cardiovascular regular rate and rhythm with normal S1, S2. Psychiatric this patient is able to make decisions and demonstrates good insight into disease process. Alert and Oriented x 3. pleasant and cooperative. Notes Patient's wounds currently appear to be fairly superficial and doing well. Overall I do think that the wound care measures that we've initiated at this point have been of great benefit. Electronic Signature(s) Signed: 03/13/2018 9:49:28 PM By: Worthy Keeler PA-C Entered By: Worthy Keeler on 03/13/2018  11:21:42 [name], [name] ([id number]) -------------------------------------------------------------------------------- Physician Orders Details Patient Name: Latino, Thanos E. Date of Service: 03/13/2018 8:30 AM Medical Record Number: [id number] Patient Account Number: [id number] Date of Birth/Sex: 1950/09/21 (67 y.o. Male) Treating RN: Harold Barban Primary Care Provider: Lamonte Sakai Other Clinician: Referring Provider: Lamonte Sakai Treating Provider/Extender: Melburn Hake, HOYT Weeks in Treatment: 45 Verbal / Phone Orders: No Diagnosis Coding ICD-10 Coding Code Description L97.521 Non-pressure chronic ulcer of other part of left foot limited to breakdown of skin L97.511 Non-pressure chronic ulcer of other part of right foot limited to breakdown of skin E11.621 Type 2 diabetes mellitus with foot ulcer I87.323 Chronic venous hypertension (idiopathic) with inflammation of bilateral lower extremity I89.0 Lymphedema, not elsewhere classified F17.218 Nicotine dependence, cigarettes, 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 abuse 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, right ankle and foot Wound Cleansing Wound #14 Left Toe Second o Clean wound with Normal Saline. Wound #16 Right,Plantar Toe Great o Clean wound with Normal Saline. Wound #2 Right Toe Second o Clean wound with Normal Saline. Anesthetic (add to Medication List) Wound #14 Left Toe Second o Topical Lidocaine 4% cream applied to wound bed prior to debridement (In Clinic Only). Wound #16 Right,Plantar Toe Great o Topical Lidocaine 4% cream applied to wound bed prior to debridement (In Clinic Only). Wound #2 Right Toe Second o Topical Lidocaine 4% cream applied to wound bed prior to debridement (In Clinic Only). Primary Wound Dressing Wound #14 Left Toe Second o Silver Alginate - secure with tape Wound #16 Right,Plantar Toe Great o Silver Alginate -  secure with tape Wound #17 Right,Distal,Plantar Metatarsal head second o Silver Alginate - secure with tape Wound #2 Right Toe Second Aubuchon, Seraj E. ([id number]) o Silver Alginate - secure with tape Dressing Change Frequency Wound #14 Left Toe Second o Change dressing every day. Wound #16 Right,Plantar Toe Great o Change dressing every day. Wound #17 Right,Distal,Plantar Metatarsal head second o Change dressing every day. Wound #2 Right Toe Second o Change dressing every day. Follow-up Appointments Wound #14 Left Toe Second o Return Appointment in 2 weeks. Wound #16 Right,Plantar Toe Great o Return Appointment in 2 weeks. Wound #17 Right,Distal,Plantar Metatarsal head second o Return Appointment in 2 weeks. Wound #2 Right Toe Second o Return Appointment in 2 weeks. Edema Control Wound #14 Left Toe Second o Patient to wear own compression stockings Wound #16 Right,Plantar Toe Great o Patient  to wear own compression stockings Wound #2 Right Toe Second o Patient to wear own compression stockings Additional Orders / Instructions Wound #14 Left Toe Second o Stop Smoking o Increase protein intake. Wound #16 Right,Plantar Toe Great o Stop Smoking o Increase protein intake. Wound #2 Right Toe Second o Stop Smoking o Increase protein intake. Electronic Signature(s) Signed: 03/13/2018 9:49:28 PM By: Worthy Keeler PA-C Signed: 03/18/2018 5:05:35 PM By: Fenton Malling ([id number]) Entered By: Harold Barban on 03/13/2018 08:59:14 Debruyne, [name] ([id number]) -------------------------------------------------------------------------------- Problem List Details Patient Name: Cisek, Mayan E. Date of Service: 03/13/2018 8:30 AM Medical Record Number: [id number] Patient Account Number: [id number] Date of Birth/Sex: 02/11/1951 (67 y.o. Male) Treating RN: Montey Hora Primary Care Provider: Lamonte Sakai Other  Clinician: Referring Provider: Lamonte Sakai Treating Provider/Extender: Melburn Hake, HOYT Weeks in Treatment: 29 Active Problems ICD-10 Evaluated Encounter Code Description Active Date Today Diagnosis L97.521 Non-pressure chronic ulcer of other part of left foot limited to 09/21/2016 No Yes breakdown of skin L97.511 Non-pressure chronic ulcer of other part of right foot limited to 09/21/2016 No Yes breakdown of skin E11.621 Type 2 diabetes mellitus with foot ulcer 09/21/2016 No Yes I87.323 Chronic venous hypertension (idiopathic) with inflammation of 09/21/2016 No Yes bilateral lower extremity I89.0 Lymphedema, not elsewhere classified 09/21/2016 No Yes F17.218 Nicotine dependence, cigarettes, with other nicotine-induced 09/21/2016 No Yes disorders F10.19 Alcohol abuse with unspecified alcohol-induced disorder 09/21/2016 No Yes M86.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, right ankle and foot 02/21/2017 No Yes Inactive Problems Resolved Problems Electronic Signature(s) WAYDE, GOPAUL ([id number]) Signed: 03/13/2018 9:49:28 PM By: Worthy Keeler PA-C Entered By: Worthy Keeler on 03/13/2018 08:52:22 Trimarco, Dontavion EMarland Kitchen ([id number]) -------------------------------------------------------------------------------- Progress Note Details Patient Name: Madkins, [name] Date of Service: 03/13/2018 8:30 AM Medical Record Number: [id number] Patient Account Number: [id number] Date of Birth/Sex: April 06, 1951 (68 y.o. Male) Treating RN: Montey Hora Primary Care Provider: Lamonte Sakai Other Clinician: Referring Provider: Lamonte Sakai Treating Provider/Extender: Melburn Hake, HOYT Weeks in Treatment: 57 Subjective Chief Complaint Information obtained from Patient Patient presents for treatment of an open diabetic ulcer to both feet History of Present Illness (HPI) The following HPI elements were documented for the patient's wound: Location: bilateral feet ulceration on the toes Quality: Patient reports  experiencing a dull pain to affected area(s). Severity: Patient states wound are getting better Duration: Patient has had the wound for > 3 months prior to seeking treatment at the wound center Timing: Pain in wound is constant (hurts all the time) Context: The wound would happen gradually Modifying Factors: Other treatment(s) tried include:treatment for lymphedema and is seen by the podiatrist Dr. Caryl Comes Associated Signs and Symptoms: Patient reports having increase swelling. 67 year old patient here to see as for bilateral feet ulceration to on his left first and second toe and 2 on his right first and second toe, which she's had for about 4 months. 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 Donella Stade. The patient is also undergoing workup by medical oncology for a intra-abdominal lymphadenopathy. 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 He is also status post appendectomy, inguinal lymph node biopsy, rectal biopsy and rectal examination under anesthesia. he currently smokes cigarettes about half packet a day. In March of this year he was seen by Dr. Belenda Cruise  Schnier, for evaluation of bilateral varicose veins and besides wearing compression stockings he had recommended laser ablation of the right and left great saphenous veins to eleviate the symptoms and complications of severe superficial venous reflux disease. He also recommended lymphedema pumps for better control of his lymphedema. The patient recently has had on 08/23/2016, right greater saphenous vein ablation with the laser energy Earlier lower extremity venous reflux examination done on 05/08/2016 showed no DVT or SVT both lower legs but incompetence of bilateral great saphenous veins was  present. A lower arterial study was also done and there was no significant right lower and left lower extremity problems based on a normal toe brachial index bilaterally and the ABI was 1.21 the left and 1.23 on the right. His post ablation venous duplex examination showed successful ablation of the right GS vein with thrombus formation 2 below the right saphenofemoral junction. The deep system was patent without evidence of thrombosis and this was done on 08/30/2016. 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 Addendum: regarding his x-rays done today and x-ray of the left foot -- IMPRESSION: No objective evidence of osteomyelitis. There are soft tissue changes which may reflect cellulitis. X-ray of the right foot -- IMPRESSION:Findings compatible with cellulitis of the toes. No objective evidence of osteomyelitis is observed. 10/01/16 on evaluation today patient's wounds appeared to be doing some better. I did review the x-rays as well which showed no evidence of osteomyelitis although there was evidence on x-ray of cellulitis. He fortunately is not having any discomfort [name], [name] ([id number]) although he continues to have some swelling. He does not remember being on any antibiotics recently. 10/15/16 on evaluation today patient's wounds overall appear to be doing better although he does have a new location noted on the left foot. Fortunately he is not having significant pain. It almost has the appearance that something is rubbing on the end of his toes but he wears the open toe shoes and according to what he is telling me never wears anything that would rub on his foot. There is no evidence of infection and specifically no evidence of a fungal infection 10/22/16 On evaluation today patient's wounds appeared to be doing better compared to last week in regard to his bilateral lower  extremities. Fortunately I happy with how things are progressing although he still has ulcers I feel like that he is improving and appropriate manner. 11/12/16 on evaluation today patient appears to be doing well in regard to his bilateral feet and the respective wounds. We have been using surrounding her dressings along with an antifungal cream which seems to be doing very well. He has no bilateral dysfunction noticed that the rituals are weight loss at this point. He also has no nausea or vomiting a note purulent discharge. He did see Vein and vascular today and he tells me that they told him he could have surgery for his venous stasis but they did not feel like it was worth it in his words. Fortunately patient's wounds do appear to be getting sneakily better. 11/26/2016 -- he says he is going to have some surgery during this week at [facility] for possibly a colon resection. 12/31/2016 -- the patient has been noncompliant with his smoking and I'm not sure whether he is also started drinking again. He continues to be very nonchalant about his care 01/14/2017 -- the patient's HandP  has been reviewed well and I understand he is being compliant with trying to give up smoking and his local dressing changes. He does not have any surgical options of 4 to him by his vascular surgeons.he was last seen in early August by Dr. Hortencia Pilar who recommended compression stockings,and possibly lymph pumps in 2-3 months after doing a review ultrasound. 01/28/2017 - the patient did not have any fresh complaints but on examination I noted a large lacerated wound on the plantar aspect of his right fourth toe which had a lot of necrotic debris and it probes down to bone. 02/07/2017 -- x-ray of the right foot -- IMPRESSION: Soft tissue swelling about the first through fourth toes consistent with cellulitis. New destructive change in the tuft of the distal phalanx of the great toe is consistent with  osteomyelitis. 02/14/2017 -- the patient's MRI is pending this coming Monday and he still continues to smoke. We have again gone over off loading of his wounds in great detail and he says he's been compliant. 02/21/2017 -- MR of the right foot -- IMPRESSION: 1. Soft tissue ulcer at the tip of the first, second and third toe knows. Cortical irregularity and bone marrow edema in the first distal phalanx most concerning for osteomyelitis. Mild marrow edema in the second and third distal phalanx without definite cortical destruction which may reflect early osteomyelitis versus reactive marrow edema. 2. Soft tissue edema surrounding the first phalanx most consistent with cellulitis. the patient was also recently evaluated by his medical oncologist Dr. 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 She is treating him with weekly Procrit. She is also keeping intra-abdominal lymphadenopathy and right lower lobe lung nodule under observation. 04/04/2017 -- he was seen by Dr. Adrian Prows on 03/25/2017 -- after review he empirically put him on ciprofloxacin and doxycycline as they have good bone penetration and good bioavailability and it will cover the usual pathogens and diabetic foot osteomyelitis. He will check inflammatory markers and plan a 26-57 week old records. C-reactive protein was 0.3 and the ESR was 72 04/18/17 on evaluation today patient appears to be doing about the same in regard to his lower extremity wounds bilaterally. He has continued to use the antifungal cream which does seem to be beneficial. Nonetheless the ulcers do seem to in some areas be epithelial eyes over and in other areas are still open. He is having no significant discomfort. 04/25/17-he is here in follow-up evaluation for multiple ulcerations to multiple toes bilaterally. He states he did see Dr. Ola Spurr again last week and continues antibiotic therapy.  He is voicing no complaints or concerns, will continue with current treatment plan will possibility of adding compression therapy next week after an additional week of treatment/lotions to BLE prescribed by Dr Ola Spurr 05/02/17 he is here in follow up for for multiple ulcers to multiple toes bilaterally. we will stop using antifungal cream and will continue with silvercel and follow up next week [name], [name] ([id number]) 05/09/17-he is here in follow-up for multiple ulcerations to multiple toes bilaterally. There is improvement in appearance. He has not completely stopped using antifungal cream, but admits he has not using it between the toes. He has an appointment with Dr. Ola Spurr on 2/11, continues on doxycycline and Cipro. It has been 5 weeks of antibiotic therapy, we will order plain film xray to evaluate for osteomyelitis next week, prior to follow up with ID. Will continue with  silvercel and follow up next week 05/16/17-he is here in follow-up evaluation for multiple ulcerations to multiple toes bilaterally and new wound to the right posterior heel. There is essentially no change in appearance, deteriorating measurements; he has a history of waxing and waning measurements. He admits that he continues to apply moisturizer/cream/ointment to his toes despite weekly reminders to only apply silvercel to his toes. He states that he thinks the surgical shoe contributed to the superficial ulcer to his posterior heel, he is unable to articulate if this was an area of dry cracked skin as he has a similar area to the left heel. He now is wearing open toed slippers. He has an appointment with Dr. Ola Spurr on 2/11. We have ordered x-rays for her bilateral feet; he was advised to obtain the x-rays today or tomorrow. He will follow-up next week 05/23/17-he is here in follow-up evaluation for multiple ulcerations to multiple toes bilaterally and the right posterior heel. There is improvement in  maceration. He has been compliant and not applying any moisturizing agent to his toes. He has been using Lac-Hydrin for his lower extremities with improvement. He did not go to his appointment on Monday with Dr. Ola Spurr secondary to financial concerns. X-rays for her bilateral feet showed: LEFT FOOT with slight erosion of the tuft of the distal phalanges of the left first and second toe suspicious for osteomyelitis, RIGHT FOOT with 1.erosion of the tufts of the distal phalanges of the right first second and possibly third toes consistent with osteomyelitis, 2 no definitive abnormality of the calcaneus is seen on the images obtained, 3. Plantar calcaneal degenerative spur. We briefly discussed hyperbaric adjunctive therapy for treatment of chronic refractory osteomyelitis. I do not find an a1c in EMR, will contact PCP for record, or order if needed. He has been encouraged to contact Dr Ola Spurr office regarding the follow-up appointment, encouraged him to inquire about payment plan. We will continue with same treatment plan and follow-up next week. He states he is still taking antibiotics and has "a lot" left. He states he has been taking them as directed, 2 pills twice daily. According to Dr. Blane Ohara office notes he was originally started on 12/17 for 4 weeks and extended on 1/14 for an additional 4 weeks. He should be done with his antibiotic therapy, he was advised to bring his bottles and to his next appointment, we will contact pharmacy. 05/30/17-he is here in follow-up evaluation for multiple ulcerations to multiple toes bilaterally and the right posterior heel. He is accompanied by his brother-in-law. Wounds are stable. He has yet to make up with Dr. Ola Spurr. We contacted his PCP, with no record of recent A1c we will draw an A1c. His brother-in-law states that he was taken off all of his diabetic medication secondary to kidney function. 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 The antibiotics were counted and have approximately 2 weeks left, although they should be complete. The pill organizer reveals missing days. We discussed the need for consistent medications, to have optimal benefit of medication. He has a cousin that lives with him and he will ask her to check his organizer daily. He has been advised to follow up with Dr Ola Spurr, and will go by the office today. He has been advised to quit smoking. 06/06/17-he is here in follow up evaluation. He has had to make an appointment with Dr. Ola Spurr. He did have blood work  obtained, a1c 5. He continues to take antibiotic therapy. Significant improvement in bilateral lower extremity edema with compression therapy. Essentially no change in ulcerations to toes. He states he is "going to try something different" and "let me know next week" if it works; he would not provide any additional information and was encouraged to follow our orders. We will follow up next week 06/13/17-he is here in follow-up evaluation. He has an appointment with Dr. Ola Spurr tomorrow morning. He states he purchased an ointment from [facility] and applied to his toes for 3 days, he does not remember the name of the ointment. There is improvement to his wounds, minimal maceration. He continues to take antibiotic therapy, this should have been completed last month. His brother-in-law who regularly accompanies his appointments was asked to take the bottles to the appointment tomorrow with Dr. Ola Spurr so he is aware. We will continue with 3 layer compression, and order OPEN TOE compression 20-30mmHg; we will apply compression stockings next week. He continues to smoke, smoked "2 cigarettes" last week 06/20/17 on evaluation today patient did receive his compression stockings which he has with him today for both lower extremities. With that being said he  tells me at this point in time that he is very happy to have these he really is not a big fan of the compression wraps that we have been utilizing although they have been of great benefit for him. Nonetheless at this point he does want to switch to the compression stockings. In my opinion as long as he is continuing with compression I'm okay with the stockings or the wraps. 06/27/17-he is here in follow-up evaluation for multiple ulcerations to his bilateral toes. There is some improvement in appearance. He is compliant in wearing his compression stockings with significant improvement in lower extremity edema. He saw Dr Ola Spurr on 3/8, per his notes they would redraw ESR and CRP; plan to continue antibiotic therapy if these remain elevated. I do not see an ESR or CRP level in Epic. The patient continues to take antibiotics. 07/11/17-He is here in follow-up evaluation for multiple ulcerations to multiple toes bilaterally. He presents with complete epithelialization to the right third toe; there has been no deterioration. He continues on antibiotic therapy. He will follow-up next week 07/18/17-He is here in follow-up evaluation for multiple ulcerations to multiple toes bilaterally. He continues to make improvement. He continues on antibiotic therapy. He states he has been using something additional to our orders, he does [name], [name] ([id number]) not elaborate but states he will bring it in next week. 07/25/17-He is here in follow up evaluation for multiple ulcerations to bilateral toes. He is stable. He has completed antibiotic therapy. He admits to "filing" his toes after showers each evening, this is what he was referencing last week; he does not filing for the wounds. We will switch to [facility] and monitor for any improvement, he will follow-up next week 08/01/17-He is here in follow-up evaluation. He admits to "picking" at his toes after cleansing yesterday, leading to new areas of tissue  loss on the bilateral second toe. There is improvement noted to the bilateral great toe. We will dress toes today and hope that they maintain until Monday where he will come in for a nurse visit. He has been advised, multiple times with expressed verbalization, to change the dressings to silvercel if the dressings get wet prior to [facility] appointment. 08/08/17-He is here in follow-up evaluation for bilateral first and second toe ulcerations. There is  significant improvement to all ulcerations since last visit. We will switch to [facility] to all wounds and he will continue with nurse visits on a Monday/Thursday schedule and follow-up with me in 2 weeks. He continues to smoke, 1-3 cigarettes per day, and has been encouraged to not smoke until his next follow-up in 2 weeks. 08/15/17 on evaluation today patient's ulcers on his toes actually appear to be doing fairly well the right to ulcers may be a little bit more moist compared to the left I'm not really sure exactly why as the openings appear to be very small and I'm not seeing any evidence of anything significant as far as you lightest or otherwise. Nonetheless this may just be a small setback he's been doing very well with the [facility] Dressing 08/22/17-He is here in follow-up evaluation for ulcerations to his bilateral first and second toes, there is small/scant amount of drainage noted on today's dressing. He continues with Hydrofera Blue. He continues to smoke, 1 cigarette a day. Voices no complaint or concerns, compliant and compression therapy today. He'll follow-up next week 08/29/17-He is here in follow-up evaluation for ulcerations to his bilateral first and second toes, with a new wound to the left third toe. He states he cut himself while cutting the toenail. He presents today with more maceration and increased measurements; waxing and waning measurements and moisture has been an ongoing issue. He continues to smoke a proximally 1  cigarette per day, but states he has not smoked since Sunday. We will initiate medihoney daily and evaluate next week. He presents today without compression stockings 09/05/17-He is here in follow-up evaluation for ulcerations to bilateral first and second and left third toe. He did not pick up the medihoney and therefore has not been changing his dressing. He admits to inconsistent where of compression stockings, admits to pain to his bilateral lower extremities with unilateral edema (right greater than left). The ulcerations to multiple toes are more macerated and larger in size than last week. He is wearing compression therapy today. He admits to an overall feeling of weakness and fatigue and has been encouraged to contact his PCP for evaluation; he has a known history of anemia and intraabdominal lymphadenopathy. We will return to silvercel and he will follow up next week 09/19/17-He is here in follow-up evaluation for ulcerations to bilateral first and second toe.he was unable to make last week's appointment. The culture that was obtained on 5/30 grew clindamycin sensitive MRSA; he was initiated on 6/4 but did not start it until 6/7. There is significant improvement in all wounds, healing to left second and third toe. He also admits to having no alcohol or smoking for the last 6 days. We will continue with same treatment plan I will extend the clindamycin for an additional 10 days and he will follow-up next week. 09/26/17- He is here in follow evaluation for multiple ulcerations to multiple toes bilaterally. He continues to be non-compliant with dressing, compression therapy. He states he is taking the antibiotics as prescribed, although his recall is inconsistent. There is noted deterioration in all ulcers. We will continue same treatment plan, antibiotic therapy and he will follow up next week. 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 This information is obtained from the medical record, as he cannot articulate specifics to his  diagnosis or treatment plan. 10/17/17-He is here in follow-up evaluation for multiple ulcerations to multiple toes bilaterally. There is chronic waxing and waning improvement/deterioration. On today's exam they appear improved, dry without maceration. He continues to apply a variety of topical agents, stating that he has switched between the blue product and silver product. In my opinion, this will be a chronic waxing and waning giving his intermittent compliance. He will follow-up in 2 weeks. As it relates to the biopsy taken from his buttock, he has not followed up with that and has not started radiation therapy. 10/31/17-He is here for evaluation for multiple ulcerations to multiple toes bilaterally. Continues with waxing and waning improvement versus deterioration. They are stable on today's exam without significant maceration. 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 We will continue with every 2 week follow-up appointments 11/14/17- He is seen in follow-up evaluation for multiple ulcerations to multiple bilateral toes. He is scheduled for excision of basal cell carcinoma to his right buttock on Monday 8/12. He continues to have chronic waxing and waning of his ulcers; today is a stable day with minimal drainage and left ear is better than right toes. I will recount to infectious disease regarding chronic suppressive therapy. He admits to being more compliant with compression stockings, although he is not wearing any today. He has been encouraged to continue with dressing changes as ordered and compression therapy daily. He will be seen in 2 weeks 11/28/17-He is seen in follow up evaluation for multiple ulcerations to multiple toes bilaterally. He had an  overnight admission at [facility] (8/11-8/12) for symptomatic anemia, received two units prbc. His surgical date has been moved to 9/94 excision of basal cell carcinoma to the right buttock. He has been noncompliant in wearing compression stockings, applying topical treatment as ordered; he has been applying a "cream", Hydrofera Blue, silvercel. He is currently on no antibiotic [name], [name] ([id number]) therapy; we will send for re-referral to ID for suppressive therapy given his multiple co-morbidities, poor surgical candidate, unwilling to have toe amputation and general non-adherence. 12/19/17 on evaluation today patient actually appears to be doing very well in regard to his toes at least compared to last time I saw him. He again has been coming to our office for quite a bit of time. 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 He states is hurting a little bit but doing well in general. I'm see were not taking care of this area dermatology is. He does have his appointment with infectious disease upcoming which they re-consult for suppressive therapy that's next Monday. 01/02/18 on evaluation today patient actually appears to be doing rather well in regard to his toe ulcers although he tells me he has been put in an ointment on this which I am concerned may be causing some additional moisture buildup which we really do not want. He supposed be using silver cell which is mainly thing I want him to be utilizing. Fortunately there does not appear to be any evidence of worsening infection which is good news. He has had surgery on his gluteal region where he had an excision of a cancerous area fortunately that seems to be doing well although he is having some discomfort. 02/06/18 on evaluation today patient actually appears to be doing well at all locations except for his second toe use  bilaterally where the  distal tip is still wrong open. He did state that he can't stand for dry skin to buildup on his toes subsequently as a question and further it sounds as if he actually pills this all which is likely keeping the toe is wrong and open. Also think that's what happened in the bottom of his foot although that appears to have healed at this point. There does not appear to be any evidence of infection. 02/20/18 on evaluation today patient appears to be doing better in regard to his foot ulcers. Everything has shown signs of improvement which is excellent news. Overall I'm very pleased with how things appear currently. The patient states he's not picking her point at any skin and that he wears shoes at all times when he is ambulating at home. Both are things that I've advocated and recommended for him in the past. 03/13/18 on evaluation today patient actually appears to be doing very well in regard to the bilateral toes in the overall appearance of the wound bed locations. Fortunately there does not appear to be any signs of infection at this time. He states that he is very pleased with how things seem to be progressing to be honest as am I. Patient History Information obtained from Patient. Social History Current every day smoker, Marital Status - Widowed, Alcohol Use - Daily - quit drinking about a week ago, Drug Use - No History, Caffeine Use - Moderate. 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 Respiratory The patient has no complaints or symptoms. Cardiovascular The patient has no complaints or symptoms. Psychiatric The patient has no complaints or symptoms. YANIV, LAGE ([id number]) Objective Constitutional Well-nourished and well-hydrated in no acute distress. Vitals Time  Taken: 8:33 AM, Height: 69 in, Weight: 168 lbs, BMI: 24.8, Temperature: 98.1 F, Pulse: 76 bpm, Respiratory Rate: 16 breaths/min, Blood Pressure: 163/78 mmHg. Respiratory normal breathing without difficulty. clear to auscultation bilaterally. Cardiovascular regular rate and rhythm with normal S1, S2. Psychiatric this patient is able to make decisions and demonstrates good insight into disease process. Alert and Oriented x 3. pleasant and cooperative. General Notes: Patient's wounds currently appear to be fairly superficial and doing well. Overall I do think that the wound care measures that we've initiated at this point have been of great benefit. Integumentary (Hair, Skin) Wound #14 status is Open. Original cause of wound was Gradually Appeared. The wound is located on the Left Toe Second. The wound measures 0.1cm length x 0.1cm width x 0.1cm depth; 0.008cm^2 area and 0.001cm^3 volume. There is Fat Layer (Subcutaneous Tissue) Exposed exposed. There is no tunneling or undermining noted. There is a medium amount of serous drainage noted. The wound margin is flat and intact. There is no granulation within the wound bed. There is no necrotic tissue within the wound bed. The periwound skin appearance did not exhibit: Callus, Crepitus, Excoriation, Induration, Rash, Scarring, Dry/Scaly, Maceration, Atrophie Blanche, Cyanosis, Ecchymosis, Hemosiderin Staining, Mottled, Pallor, Rubor, Erythema. Wound #16 status is Open. Original cause of wound was Not Known. The wound is located on the [employer]. The wound measures 0.6cm length x 0.7cm width x 0.1cm depth; 0.33cm^2 area and 0.033cm^3 volume. There is Fat Layer (Subcutaneous Tissue) Exposed exposed. There is no tunneling or undermining noted. There is a medium amount of serous drainage noted. The wound margin is flat and intact. There is  large (67-100%) red granulation within the wound bed. There is no necrotic tissue within the wound  bed. The periwound skin appearance did not exhibit: Callus, Crepitus, Excoriation, Induration, Rash, Scarring, Dry/Scaly, Maceration, Atrophie Blanche, Cyanosis, Ecchymosis, Hemosiderin Staining, Mottled, Pallor, Rubor, Erythema. Periwound temperature was noted as No Abnormality. Wound #17 status is Open. Original cause of wound was Trauma. The wound is located on the Right,Distal,Plantar Metatarsal head second. The wound measures 1.2cm length x 0.2cm width x 0.2cm depth; 0.188cm^2 area and 0.038cm^3 volume. There is no tunneling or undermining noted. There is a medium amount of serous drainage noted. The wound margin is flat and intact. There is large (67-100%) pink granulation within the wound bed. There is no necrotic tissue within the wound bed. The periwound skin appearance did not exhibit: Callus, Crepitus, Excoriation, Induration, Rash, Scarring, Dry/Scaly, Maceration, Atrophie Blanche, Cyanosis, Ecchymosis, Hemosiderin Staining, Mottled, Pallor, Rubor, Erythema. Periwound temperature was noted as No Abnormality. Wound #2 status is Open. Original cause of wound was Gradually Appeared. The wound is located on the Right Toe Second. The wound measures 0.3cm length x 1.1cm width x 0.1cm depth; 0.259cm^2 area and 0.026cm^3 volume. There is Fat Layer (Subcutaneous Tissue) Exposed exposed. There is no tunneling or undermining noted. There is a medium amount of serous drainage noted. The wound margin is flat and intact. There is large (67-100%) pink granulation within the wound bed. There is no necrotic tissue within the wound bed. The periwound skin appearance did not exhibit: Callus, Crepitus, Excoriation, Induration, Rash, Scarring, Dry/Scaly, Maceration, Atrophie Blanche, Cyanosis, Ecchymosis, Hemosiderin Staining, Mottled, Pallor, Rubor, Erythema. Periwound temperature was noted as No Abnormality. Wound #5 status is Healed - Epithelialized. Original cause of wound was Gradually Appeared. The  wound is located on the Left Toe Great. The wound measures 0cm length x 0cm width x 0cm depth; 0cm^2 area and 0cm^3 volume. 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 Wound #16 Right,Plantar Toe Great: Clean wound with Normal Saline. Wound #2 Right Toe Second: Clean wound with Normal Saline. Anesthetic (add to Medication List): Wound #14 Left Toe Second: Topical Lidocaine 4% cream applied to wound bed prior to debridement (In Clinic Only). Wound #16 Right,Plantar Toe Great: Topical Lidocaine 4% cream applied to wound bed prior to debridement (In Clinic Only). Wound #2 Right Toe Second: Topical Lidocaine 4% cream applied to wound bed prior to debridement (In Clinic Only). Primary Wound Dressing: Wound #14 Left Toe Second: Silver Alginate - secure with tape Wound #16 Right,Plantar Toe Great: Silver Alginate - secure with tape Wound #17 Right,Distal,Plantar Metatarsal head second: Silver Alginate - secure with tape Wound #2 Right Toe Second: Silver Alginate - secure with tape Dressing Change Frequency: Wound #14 Left Toe Second: Change dressing every day. Wound #16 Right,Plantar Toe Great: Change dressing every day. Wound #17 Right,Distal,Plantar Metatarsal head second: Change dressing every day. Wound #2 Right Toe Second: Change dressing every day. Follow-up  Appointments: Wound #14 Left Toe Second: Return Appointment in 2 weeks. JAYLYNN, [name] ([id number]) Wound #16 Right,Plantar  Toe Great: Return Appointment in 2 weeks. Wound #17 Right,Distal,Plantar Metatarsal head second: Return Appointment in 2 weeks. Wound #2 Right Toe Second: Return Appointment in 2 weeks. Edema Control: Wound #14 Left Toe Second: Patient to wear own compression stockings Wound #16 Right,Plantar Toe Great: Patient to wear own compression stockings Wound #2 Right Toe Second: Patient to wear own compression stockings Additional Orders / Instructions: Wound #14 Left Toe Second: Stop Smoking Increase protein intake. Wound #16 Right,Plantar Toe Great: Stop Smoking Increase protein intake. Wound #2 Right Toe Second: Stop Smoking Increase protein intake. I'm gonna suggest currently that we go ahead and initiate a continuation of this Current wound care measures since things seem to be progressing so well. Patient is in agreement with the plan. Please see above for specific wound care orders. We will see patient for re-evaluation in 1 week(s) here in the clinic. If anything worsens or changes patient will contact our office for additional recommendations. Electronic Signature(s) Signed: 03/18/2018 8:14:55 AM By: Worthy Keeler PA-C Previous Signature: 03/13/2018 9:49:28 PM Version By: Worthy Keeler PA-C Entered By: Worthy Keeler on 03/18/2018 08:12:36 Veselka, [name] ([id number]) -------------------------------------------------------------------------------- ROS/PFSH Details Patient Name: Pankonin, Iori E. Date of Service: 03/13/2018 8:30 AM Medical Record Number: [id number] Patient Account Number: [id number] Date of Birth/Sex: [birth year] (67 y.o. Male) Treating RN: Montey Hora Primary Care Provider: Lamonte Sakai Other Clinician: Referring Provider: Lamonte Sakai Treating Provider/Extender: Melburn Hake, HOYT Weeks in Treatment: 30 Information Obtained  From Patient Wound History Do you currently have one or more open woundso Yes How many open wounds do you currently haveo 6 Approximately how long have you had your woundso 3 months How have you been treating your wound(s) until nowo ointment and bandage Has your wound(s) ever healed and then re-openedo No Have you had any lab work done in the past montho No Have you tested positive for an antibiotic resistant organism (MRSA, VRE)o No Have you tested positive for osteomyelitis (bone infection)o No Have you had any tests for circulation on your legso Yes Who ordered the testo PCP Where was the test doneo [facility] Constitutional Symptoms (General Health) Complaints and Symptoms: Negative for: Fatigue; Fever; Chills Eyes Medical History: Negative for: Cataracts; Glaucoma; Optic Neuritis Ear/Nose/Mouth/Throat Medical History: Negative for: Chronic sinus problems/congestion; Middle ear problems Hematologic/Lymphatic Medical History: Positive for: Anemia; Lymphedema Negative for: Hemophilia; Human Immunodeficiency Virus; Sickle Cell Disease Respiratory Complaints and Symptoms: No Complaints or Symptoms Medical History: Negative for: Aspiration; Asthma; Chronic Obstructive Pulmonary Disease (COPD); Pneumothorax; Sleep Apnea; Tuberculosis Cardiovascular Complaints and Symptoms: No Complaints or Symptoms Weniger, Ioannis E. ([id number]) Medical History: Positive for: Congestive Heart Failure; Hypertension; Peripheral Venous Disease Negative for: Angina; Arrhythmia; Coronary Artery Disease; Deep Vein Thrombosis; Hypotension; Myocardial Infarction; Peripheral Arterial Disease; Phlebitis; Vasculitis Gastrointestinal Medical History: Negative for: Cirrhosis ; Colitis; Crohnos; Hepatitis A; Hepatitis B; Hepatitis C Endocrine Medical History: Positive for: Type II Diabetes Treated with: Oral agents Blood sugar tested every day: Yes Tested : QD Genitourinary Medical History: Negative for:  End Stage Renal Disease Immunological Medical History: Negative for: Lupus Erythematosus; Raynaudos; Scleroderma Integumentary (Skin) Medical History: Negative for: History of Burn; History of pressure wounds Musculoskeletal Medical History: Negative for: Gout; Rheumatoid Arthritis; Osteoarthritis; Osteomyelitis Neurologic Medical History: Positive for: Neuropathy Negative for: Dementia; Quadriplegia; Paraplegia; Seizure Disorder Oncologic Medical History: Past Medical History Notes: 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 ([id number]) Immunization Notes: up to date Implantable Devices Family and Social History Current every day smoker; Marital Status - Widowed; Alcohol Use: Daily - quit drinking about a week ago; Drug Use: No History; Caffeine Use: Moderate; Financial Concerns: No; Food, Clothing or Shelter Needs: No; Support System Lacking: No; Transportation Concerns: No; Advanced Directives: No; Patient does not want information on Advanced Directives Physician Affirmation I have reviewed and agree with the above information. Electronic Signature(s) Signed: 03/13/2018 5:28:33 PM By: Montey Hora Signed: 03/13/2018 9:49:28 PM By: Worthy Keeler PA-C Entered By: Worthy Keeler on 03/13/2018 11:21:25 Trochez, [name] ([id number]) -------------------------------------------------------------------------------- SuperBill Details Patient Name: Yuhas, Strother E. Date of Service: 03/13/2018 Medical Record Number: [id number] Patient Account Number: [id number] Date of Birth/Sex: September 16, 1950 (67 y.o. 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 of skin E11.621 Type 2 diabetes mellitus with foot ulcer I87.323 Chronic venous hypertension (idiopathic) with inflammation of bilateral lower extremity I89.0 Lymphedema, not elsewhere classified F17.218 Nicotine dependence, cigarettes, 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 abuse 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, right ankle and foot Facility Procedures CPT4 Code: [id number] Description: 47125 - WOUND CARE VISIT-LEV 5 EST PT Modifier: Quantity: 1 Physician Procedures CPT4 Code Description: 2712929 09030 - WC PHYS LEVEL 4 - EST PT ICD-10 Diagnosis Description L97.521 Non-pressure chronic ulcer of other part of left foot limited to L97.511 Non-pressure chronic ulcer of other part of right foot limited t E11.621 Type 2  diabetes mellitus with foot ulcer I87.323 Chronic venous hypertension (idiopathic) with inflammation of bi Modifier: breakdown of s o breakdown of lateral lower e Quantity: 1 kin skin xtremity Electronic Signature(s) Signed: 03/13/2018 9:49:28 PM By: Worthy Keeler PA-C Entered By: Worthy Keeler on 03/13/2018 11:22:13

## 2018-03-21 ENCOUNTER — Encounter: Payer: Self-pay | Admitting: Nurse Practitioner

## 2018-03-21 ENCOUNTER — Inpatient Hospital Stay (HOSPITAL_BASED_OUTPATIENT_CLINIC_OR_DEPARTMENT_OTHER): Payer: Medicare HMO | Admitting: Nurse Practitioner

## 2018-03-21 ENCOUNTER — Inpatient Hospital Stay: Payer: Medicare HMO

## 2018-03-21 ENCOUNTER — Ambulatory Visit: Payer: Medicare HMO | Admitting: Oncology

## 2018-03-21 ENCOUNTER — Inpatient Hospital Stay: Payer: Medicare HMO | Attending: Oncology

## 2018-03-21 VITALS — BP 176/94 | HR 84 | Temp 97.0°F | Resp 20 | Wt 190.0 lb

## 2018-03-21 DIAGNOSIS — D472 Monoclonal gammopathy: Secondary | ICD-10-CM

## 2018-03-21 DIAGNOSIS — N189 Chronic kidney disease, unspecified: Secondary | ICD-10-CM

## 2018-03-21 DIAGNOSIS — D509 Iron deficiency anemia, unspecified: Secondary | ICD-10-CM | POA: Insufficient documentation

## 2018-03-21 DIAGNOSIS — Z79899 Other long term (current) drug therapy: Secondary | ICD-10-CM

## 2018-03-21 DIAGNOSIS — I10 Essential (primary) hypertension: Secondary | ICD-10-CM | POA: Diagnosis not present

## 2018-03-21 DIAGNOSIS — D631 Anemia in chronic kidney disease: Secondary | ICD-10-CM | POA: Insufficient documentation

## 2018-03-21 DIAGNOSIS — F17210 Nicotine dependence, cigarettes, uncomplicated: Secondary | ICD-10-CM | POA: Diagnosis not present

## 2018-03-21 LAB — IRON AND TIBC
Iron: 208 ug/dL — ABNORMAL HIGH (ref 45–182)
SATURATION RATIOS: 68 % — AB (ref 17.9–39.5)
TIBC: 307 ug/dL (ref 250–450)
UIBC: 99 ug/dL

## 2018-03-21 LAB — CBC
HEMATOCRIT: 24.5 % — AB (ref 39.0–52.0)
HEMOGLOBIN: 8 g/dL — AB (ref 13.0–17.0)
MCH: 30.3 pg (ref 26.0–34.0)
MCHC: 32.7 g/dL (ref 30.0–36.0)
MCV: 92.8 fL (ref 80.0–100.0)
NRBC: 0 % (ref 0.0–0.2)
Platelets: 162 10*3/uL (ref 150–400)
RBC: 2.64 MIL/uL — AB (ref 4.22–5.81)
RDW: 13.4 % (ref 11.5–15.5)
WBC: 7.6 10*3/uL (ref 4.0–10.5)

## 2018-03-21 NOTE — Patient Instructions (Signed)
Please schedule an appointment with PCP to address your blood pressure. It was a pleasure meeting you today and thank you for allowing me to participate in your care. Beckey Rutter, NP

## 2018-03-21 NOTE — Progress Notes (Signed)
Hematology/Oncology Progress Note [facility]  [phone] [phone]  Patient Care Team: [name], MD as PCP - General (Internal Medicine) Sindy Guadeloupe, MD as Consulting Physician (Hematology and Oncology)   Name of the patient: [name]  [id number]  04/20/1950   Date of visit: 03/21/18  Diagnosis-  1.Anemia- multifactorial- iron deficiency, chronic disease and anemia of kidney disease  2. 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. 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 Baruch Gouty. Patient had CT abdomen in July 2016 which showed retroperitoneal, celiac and gastric adenopathy. MR pelvis in Oct 2017 showed b/l inguinal and external iliac adenopathy as well. Inguinal LN biopsy was negative for malignancy.   2. Biopsy of the skin lesion showed: DIAGNOSIS:  A. PERIANAL POLYP, LEFT; EXCISION:  - SKIN WITH POLYPOID VASCULAR PROLIFERATION WITH SUPERFICIAL NECROSIS, SEE COMMENT.   B. PERIANAL MASS, RIGHT; INCISIONAL BIOPSY:  - INVASIVE CARCINOMA WITH BASALOID FEATURES.  - SEE COMMENT.   Comment #1:  The differential diagnosis for the perianal polyp includes pyogenic granuloma and ulcerated hemangioma.   Comment #2:  Immunohistochemical stains were performed. The carcinoma is positive for p16, BerEp4, and BCL-2. Stain controls worked appropriately.  Unfortunately the pattern of staining does not assist with further  classification of the carcinoma. Clinical correlation is required to determine if the lesion involves perianal skin primarily (BCC) or is arising in the anal canal (squamous cell carcinoma with basaloid features).   This was followed by RT to the lesion.  3. He had  repeat CT abdomen in feb 2018 which showed: IMPRESSION: Progressive hepatic cirrhosis since prior study. No evidence of hepatic neoplasm.  New moderate ascites, diffuse mesenteric and body wall edema, and small bilateral pleural effusions. Mild diffuse small bowel wall thickening, consistent with hypoalbuminemia.  Findings consistent with portal venous hypertension, including mild splenomegaly and upper abdominal venous collaterals.  Stable mild abdominal lymphadenopathy. Mild increase in bilateral iliac and inguinal lymphadenopathy in the pelvis, which may be reactive in etiology, with metastatic disease considered less Likely.  4. Heunderwentegd and colonoscopy for his microcytic anemiawhich showed no overt bleeding lesions or malignancy  5. CBC on 09/18/2016 showed H&H of 6.5/19.8 and MCV of 86.7. Labs suggestive of iron deficiency anemia. B12 and folate was within normal limits. Multiple myeloma panel showed IGG monoclonal protein of 0.9 gm. Polyclonal gammopathyHaptoglobin and no copper was normal. Reticulocyte count was mildly elevated at 7.9. Patient received 2 doses of ferriheme on 620 and 10/03/2016. He also received 1 unit of blood transfusion  6. EGD showed a normal duodenum and normal esophagus. Found to have gastritis which was biopsied. Also found to have a large polypoid mass in the left piriform sinus and nonobstructing the airway. This was subsequently evaluated by ENT which did not find any evidence of mass. The colonoscopy showed appendixthat was appearing protruding and ordered this blood with stool. No mucus seen in the vicinity. He was evaluated by a [facility] and has been considered for a repeat colonoscopy versus endoscopy mucosal resection of the mucocele  7.Bonemarrowbiopsy showed hypercellular marrow for age. 9% plasma cells. Clonality of cells could not be evaluated because of lack of clot sections available for staining. Cytogenetics were  normal. FISH  studies were negative for any chromosomal abnormalities as well.  8. Patient received procrit for anemia of chronic kidney disease with improvement of his anemia to 10  9. Underwent excision of basal cell right gluteal mass with Dr. Bary Castilla on 12/16/17.   Interval history-on 12/16/2017 he underwent excision of basal cell right gluteal mass with Dr. Bary Castilla.  He is currently being followed by wound care for open diabetic ulcers to both feet.  01/21/18- Referred to ER for right shoulder pain and BP of 184/85.  Creatinine was 3.37.  BUN 58. Hemoglobin 9.4.  Right shoulder x-ray showed narrowed humeral acromial distance consistent with rotator cuff tendenitis.  Chest x-ray negative.  He was admitted to hospital for further evaluation. Shoulder pain improved with steroids. Acute on CKD thought to be secondary to NSAIDs . Cr 3.28 at discharge. Discharged to home.  02/21/18- Hemoglobin 8.8, Hematocrit 27.2.  Last Retacrit was 12/12/17. Since that time retacrit held d/t blood pressure issues.   He reports feeling well today.  Appetite is good and he denies any unintentional weight loss.  Blood pressure continues to be elevated he has not recently seen primary care for changes to his medication.  Does not consistently check it at home.  ECOG PS- 1 Pain scale- 0 Opioid associated constipation- no  Review of systems- Review of Systems  Constitutional: Positive for malaise/fatigue. Negative for chills, fever and weight loss.  HENT: Negative for congestion, ear discharge and nosebleeds.   Eyes: Negative for blurred vision.  Respiratory: Negative for cough, hemoptysis, sputum production, shortness of breath and wheezing.   Cardiovascular: Negative for chest pain, palpitations, orthopnea and claudication.  Gastrointestinal: Negative for abdominal pain, blood in stool, constipation, diarrhea, heartburn, melena, nausea and vomiting.  Genitourinary: Negative for dysuria, flank pain, frequency, hematuria and  urgency.  Musculoskeletal: Negative for back pain, joint pain and myalgias.  Skin: Negative for rash.  Neurological: Negative for dizziness, tingling, focal weakness, seizures, weakness and headaches.  Endo/Heme/Allergies: Does not bruise/bleed easily.  Psychiatric/Behavioral: Negative for depression and suicidal ideas. The patient does not have insomnia.     No Known Allergies    Past Medical History:  Diagnosis Date  . Anemia   . Cancer ([place]) 11/02/2014   PERIANAL MASS, RIGHT; INCISIONAL BIOPSY: INVASIVE CARCINOMA WITH BASALOID FEATURES  . CHF (congestive heart failure) ([place])   . Chronic kidney disease   . COPD (chronic obstructive pulmonary disease) ([place])    NO INHALERS  . Diabetes mellitus    NO MEDS  . GERD (gastroesophageal reflux disease)   . Hepatic cirrhosis ([place])   . Hypertension   . Leg swelling   . Loose, teeth    PATIENT STATES "HAS 6 TEETH, SOME ARE LOOSE"  . Neuromuscular disorder ([place])    RIGHT HAND AND FINGERS NUMB  . Shortness of breath dyspnea    WITH EXERTION   . 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 02/24/2016     Past Surgical History:  Procedure Laterality Date  . APPENDECTOMY    . COLONOSCOPY WITH PROPOFOL N/A 11/19/2014   Procedure: COLONOSCOPY WITH PROPOFOL;  Surgeon: Lucilla Lame, MD;  Location: [facility];  Service: Endoscopy;  Laterality: N/A;  DIABETIC-ORAL MEDS  . COLONOSCOPY WITH PROPOFOL N/A 10/04/2016   Procedure: COLONOSCOPY WITH PROPOFOL;  Surgeon: Jonathon Bellows, MD;  Location: [facility] ENDOSCOPY;  Service: Endoscopy;  Laterality: N/A;  . ESOPHAGOGASTRODUODENOSCOPY (EGD) WITH PROPOFOL N/A 10/04/2016   Procedure: ESOPHAGOGASTRODUODENOSCOPY (EGD) WITH PROPOFOL;  Surgeon: Jonathon Bellows, MD;  Location: [facility] ENDOSCOPY;  Service: Endoscopy;  Laterality: N/A;  . GIVENS CAPSULE STUDY N/A 11/13/2016   Procedure: GIVENS CAPSULE STUDY;  Surgeon: Jonathon Bellows, MD;  Location: [facility] ENDOSCOPY;  Service: Gastroenterology;  Laterality: N/A;  . INCISION AND  DRAINAGE PERIRECTAL ABSCESS Right 12/16/2017   Procedure: EXCISION BASAL CELL CANCER/ GLUTEAL ABSCESS AND FLAP COVERAGE;  Surgeon: Robert Bellow, MD;  Location: [facility] ORS;  Service: General;  Laterality: Right;  . INGUINAL LYMPH NODE BIOPSY Right 01/26/2016   Procedure: INGUINAL LYMPH NODE BIOPSY;  Surgeon: Clayburn Pert, MD;  Location: [facility] ORS;  Service: General;  Laterality: Right;  . RECTAL BIOPSY N/A 11/02/2014   Procedure: BIOPSY RECTAL;  Surgeon: Marlyce Huge, MD;  Location: [facility] ORS;  Service: General;  Laterality: N/A;  . RECTAL EXAM UNDER ANESTHESIA N/A 11/02/2014   Procedure: RECTAL EXAM UNDER ANESTHESIA;  Surgeon: Marlyce Huge, MD;  Location: [facility] ORS;  Service: General;  Laterality: N/A;  . TONSILLECTOMY      Social History   Socioeconomic History  . Marital status: Divorced    Spouse name: Not on file  . Number of children: Not on file  . Years of education: Not on file  . Highest education level: Not on file  Occupational History  . Not on file  Social Needs  . Financial resource strain: Somewhat hard  . Food insecurity:    Worry: Patient refused    Inability: Patient refused  . Transportation needs:    Medical: Patient refused    Non-medical: Patient refused  Tobacco Use  . Smoking status: Current Every Day Smoker    Packs/day: 0.50    Years: 20.00    Pack years: 10.00    Types: Cigarettes  . Smokeless tobacco: Never Used  . Tobacco comment: 1 PACK EVERY 2 DAYS  Substance and Sexual Activity  . Alcohol use: Not Currently  . Drug use: Yes    Types: Marijuana    Comment: Last Use 2016 per patient-+ UDS FOR COCAINE IN 2014  . Sexual activity: Never  Lifestyle  . Physical activity:    Days per week: 0 days    Minutes per session: 0 min  . Stress: To some extent  Relationships  . Social connections:    Talks on phone: More than three times a week    Gets together: More than three times a week    Attends religious service: More than 4  times per year    Active member of club or organization: Yes    Attends meetings of clubs or organizations: More than 4 times per year    Relationship status: Divorced  . Intimate partner violence:    Fear of current or ex partner: Patient refused    Emotionally abused: Patient refused    Physically abused: Patient refused    Forced sexual activity: Patient refused  Other Topics Concern  . Not on file  Social History Narrative  . Not on file    Family History  Problem Relation Age of Onset  . Asthma Mother   . Colon cancer Neg Hx      Current Outpatient Medications:  .  amLODipine (NORVASC) 10 MG tablet, Take 10 mg by mouth every morning. , Disp: , Rfl:  .  atorvastatin (LIPITOR) 40 MG tablet, Take 40 mg by mouth every morning. , Disp: , Rfl:  .  bismuth subsalicylate (PEPTO BISMOL) 262 MG/15ML suspension, Take 30 mLs by mouth every 6 (six) hours as needed for indigestion or diarrhea or  loose stools. , Disp: , Rfl:  .  docusate sodium (COLACE) 100 MG capsule, Take 1 capsule (100 mg total) by mouth 2 (two) times daily., Disp: 10 capsule, Rfl: 0 .  ferrous sulfate 325 (65 FE) MG tablet, Take 325 mg by mouth daily. , Disp: , Rfl:  .  hydrALAZINE (APRESOLINE) 25 MG tablet, Take 25 mg 2 (two) times daily by mouth., Disp: , Rfl:  .  nicotine (NICODERM CQ - DOSED IN MG/24 HOURS) 21 mg/24hr patch, Place 1 patch (21 mg total) onto the skin daily. (Patient not taking: Reported on 03/11/2018), Disp: 28 patch, Rfl: 0 .  omeprazole (PRILOSEC) 40 MG capsule, Take 40 mg by mouth every morning. , Disp: , Rfl:  .  polyethylene glycol (MIRALAX / GLYCOLAX) packet, Take 17 g by mouth daily as needed for mild constipation. , Disp: , Rfl:  .  silver sulfADIAZINE (SILVADENE) 1 % cream, Apply to affected area twice daily, Disp: 400 g, Rfl: 1 .  sodium bicarbonate 650 MG tablet, Take 1 tablet (650 mg total) by mouth 3 (three) times daily., Disp: 20 tablet, Rfl: 0 .  vitamin B-12 (CYANOCOBALAMIN) 1000 MCG  tablet, Take 1 tablet (1,000 mcg total) by mouth daily., Disp: 30 tablet, Rfl: 3 .  Vitamin D, Ergocalciferol, (DRISDOL) 50000 units CAPS capsule, Take 50,000 Units by mouth every Tuesday. , Disp: , Rfl:  No current facility-administered medications for this visit.   Facility-Administered Medications Ordered in Other Visits:  .  Darbepoetin Alfa (ARANESP) injection 100 mcg, 100 mcg, Subcutaneous, Q30 days, Sindy Guadeloupe, MD, 100 mcg at 07/25/17 0924 .  Darbepoetin Alfa (ARANESP) injection 100 mcg, 100 mcg, Subcutaneous, Q30 days, Sindy Guadeloupe, MD, 100 mcg at 08/22/17 1015 .  epoetin alfa (EPOGEN,PROCRIT) injection 40,000 Units, 40,000 Units, Subcutaneous, Weekly, Sindy Guadeloupe, MD, 40,000 Units at 05/23/17 1145  Physical exam:  Vitals:   03/21/18 1421 03/21/18 1439  BP: (!) 182/90 (!) 176/94  Pulse: 85 84  Resp: 20 20  Temp: (!) 97 F (36.1 C)   TempSrc: Tympanic   Weight: 190 lb (86.2 kg)    Physical Exam HENT:     Head: Normocephalic and atraumatic.  Eyes:     Pupils: Pupils are equal, round, and reactive to light.  Neck:     Musculoskeletal: Normal range of motion.  Cardiovascular:     Rate and Rhythm: Normal rate and regular rhythm.     Heart sounds: Normal heart sounds.  Pulmonary:     Effort: Pulmonary effort is normal.     Breath sounds: Normal breath sounds.  Abdominal:     General: Bowel sounds are normal.     Palpations: Abdomen is soft.  Musculoskeletal: Normal range of motion.  Skin:    General: Skin is warm and dry.  Neurological:     Mental Status: He is alert and oriented to person, place, and time.  Psychiatric:        Mood and Affect: Mood normal.        Behavior: Behavior normal.      CMP Latest Ref Rng & Units 01/24/2018  Glucose 70 - 99 mg/dL 134(H)  BUN 8 - 23 mg/dL 68(H)  Creatinine 0.61 - 1.24 mg/dL 3.28(H)  Sodium 135 - 145 mmol/L 135  Potassium 3.5 - 5.1 mmol/L 4.9  Chloride 98 - 111 mmol/L 110  CO2 22 - 32 mmol/L 19(L)  Calcium 8.9  - 10.3 mg/dL 8.3(L)  Total Protein 6.5 - 8.1 g/dL -  Total Bilirubin 0.3 - 1.2 mg/dL -  Alkaline Phos 38 - 126 U/L -  AST 15 - 41 U/L -  ALT 0 - 44 U/L -   CBC Latest Ref Rng & Units 02/21/2018  WBC 4.0 - 10.5 K/uL 8.7  Hemoglobin 13.0 - 17.0 g/dL 8.8(L)  Hematocrit 39.0 - 52.0 % 27.2(L)  Platelets 150 - 400 K/uL 165    No images are attached to the encounter.  No results found.   Assessment and plan- Patient is a 67 y.o. male with anemia of chronic disease currently on Retacrit.   Hemoglobin today 8.0. asymptomatic. Iron saturation 68, TIBC 307. Recent gfr 21. Retacrit held today due to uncontrolled blood pressure.  Advised patient to follow-up with his PCP for management of blood pressure.  He says he is going there today to discuss.  We will have patient return to clinic in 1 month for labs and consideration of Retacrit with Dr. Janese Banks  Retacrit today held d/t uncontrolled blood pressure.   MGUS-stable MGUS labs today. Follow up with Dr. Janese Banks for continued monitoring.   1 month- cbc, hold tube  Visit Diagnosis 1. Anemia in chronic kidney disease, unspecified CKD stage   2. MGUS (monoclonal gammopathy of unknown significance)     Beckey Rutter, DNP, AGNP-C [place] [facility] [phone] (work cell) [phone] (office)  Cc: Dr. Janese Banks Dr. Humphrey Rolls

## 2018-03-27 ENCOUNTER — Encounter: Payer: Medicare HMO | Admitting: Physician Assistant

## 2018-03-27 DIAGNOSIS — N189 Chronic kidney disease, unspecified: Secondary | ICD-10-CM

## 2018-03-27 DIAGNOSIS — D472 Monoclonal gammopathy: Secondary | ICD-10-CM | POA: Insufficient documentation

## 2018-03-27 DIAGNOSIS — E11621 Type 2 diabetes mellitus with foot ulcer: Secondary | ICD-10-CM | POA: Diagnosis not present

## 2018-03-27 DIAGNOSIS — D631 Anemia in chronic kidney disease: Secondary | ICD-10-CM | POA: Insufficient documentation

## 2018-03-30 NOTE — Progress Notes (Signed)
RIDDIK, SENNA ([id number]) Visit Report for 03/27/2018 Arrival Information Details Patient Name: [name], [name] Date of Service: 03/27/2018 8:30 AM Medical Record Number: [id number] Patient Account Number: [id number] Date of Birth/Sex: 02-22-1951 (67 y.o. M) Treating RN: Primary Care Ekam Besson: Lamonte Sakai Other Clinician: Referring Lavita Pontius: Lamonte Sakai Treating Dalissa Lovin/Extender: Melburn Hake, HOYT Weeks in Treatment: 38 Visit Information History Since Last Visit Added or deleted any medications: Yes Patient Arrived: Ambulatory Any new allergies or adverse reactions: No Arrival Time: 08:25 Had a fall or experienced change in No Accompanied By: brother in activities of daily living that may affect law risk of falls: Transfer Assistance: None Signs or symptoms of abuse/neglect since last visito No Patient Identification Verified: Yes Hospitalized since last visit: No Secondary Verification Process Completed: Yes Implantable device outside of the clinic excluding No Patient Requires Transmission-Based No cellular tissue based products placed in the center Precautions: since last visit: Patient Has Alerts: Yes Has Dressing in Place as Prescribed: Yes Patient Alerts: DMII Pain Present Now: No Electronic Signature(s) Signed: 03/27/2018 3:46:06 PM By: Lorine Bears RCP, RRT, CHT Entered By: Becky Sax, Amado Nash on 03/27/2018 08:29:55 [name], [name] ([id number]) -------------------------------------------------------------------------------- Clinic Level of Care Assessment Details Patient Name: [name], [name] Date of Service: 03/27/2018 8:30 AM Medical Record Number: [id number] Patient Account Number: [id number] Date of Birth/Sex: 1950-04-17 (67 y.o. M) Treating RN: Montey Hora Primary Care Kameron Blethen: Lamonte Sakai Other Clinician: Referring Adalin Vanderploeg: Lamonte Sakai Treating Quadir Muns/Extender: Melburn Hake, HOYT Weeks in Treatment: 71 Clinic Level of Care  Assessment Items TOOL 4 Quantity Score []  - Use when only an EandM is performed on FOLLOW-UP visit 0 ASSESSMENTS - Nursing Assessment / Reassessment X - Reassessment of Co-morbidities (includes updates in patient status) 1 10 X- 1 5 Reassessment of Adherence to Treatment Plan ASSESSMENTS - Wound and Skin Assessment / Reassessment []  - Simple Wound Assessment / Reassessment - one wound 0 X- 4 5 Complex Wound Assessment / Reassessment - multiple wounds []  - 0 Dermatologic / Skin Assessment (not related to wound area) ASSESSMENTS - Focused Assessment []  - Circumferential Edema Measurements - multi extremities 0 []  - 0 Nutritional Assessment / Counseling / Intervention X- 1 5 Lower Extremity Assessment (monofilament, tuning fork, pulses) []  - 0 Peripheral Arterial Disease Assessment (using hand held doppler) ASSESSMENTS - Ostomy and/or Continence Assessment and Care []  - Incontinence Assessment and Management 0 []  - 0 Ostomy Care Assessment and Management (repouching, etc.) PROCESS - Coordination of Care X - Simple Patient / Family Education for ongoing care 1 15 []  - 0 Complex (extensive) Patient / Family Education for ongoing care []  - 0 Staff obtains Programmer, systems, Records, Test Results / Process Orders []  - 0 Staff telephones HHA, Nursing Homes / Clarify orders / etc []  - 0 Routine Transfer to another Facility (non-emergent condition) []  - 0 Routine Hospital Admission (non-emergent condition) []  - 0 New Admissions / Biomedical engineer / Ordering NPWT, Apligraf, etc. []  - 0 Emergency Hospital Admission (emergent condition) X- 1 10 Simple Discharge Coordination [name], [name] ([id number]) []  - 0 Complex (extensive) Discharge Coordination PROCESS - Special Needs []  - Pediatric / Minor Patient Management 0 []  - 0 Isolation Patient Management []  - 0 Hearing / Language / Visual special needs []  - 0 Assessment of Community assistance (transportation, D/C planning,  etc.) []  - 0 Additional assistance / Altered mentation []  - 0 Support Surface(s) Assessment (bed, cushion, seat, etc.) INTERVENTIONS - Wound Cleansing / Measurement []  - Simple Wound Cleansing - one wound  0 X- 4 5 Complex Wound Cleansing - multiple wounds X- 1 5 Wound Imaging (photographs - any number of wounds) []  - 0 Wound Tracing (instead of photographs) []  - 0 Simple Wound Measurement - one wound X- 4 5 Complex Wound Measurement - multiple wounds INTERVENTIONS - Wound Dressings X - Small Wound Dressing one or multiple wounds 4 10 []  - 0 Medium Wound Dressing one or multiple wounds []  - 0 Large Wound Dressing one or multiple wounds []  - 0 Application of Medications - topical []  - 0 Application of Medications - injection INTERVENTIONS - Miscellaneous []  - External ear exam 0 []  - 0 Specimen Collection (cultures, biopsies, blood, body fluids, etc.) []  - 0 Specimen(s) / Culture(s) sent or taken to Lab for analysis []  - 0 Patient Transfer (multiple staff / Civil Service fast streamer / Similar devices) []  - 0 Simple Staple / Suture removal (25 or less) []  - 0 Complex Staple / Suture removal (26 or more) []  - 0 Hypo / Hyperglycemic Management (close monitor of Blood Glucose) []  - 0 Ankle / Brachial Index (ABI) - do not check if billed separately X- 1 5 Vital Signs [name], [name] ([id number]) Has the patient been seen at the hospital within the last three years: Yes Total Score: 155 Level Of Care: New/Established - Level 4 Electronic Signature(s) Signed: 03/28/2018 5:12:41 PM By: Montey Hora Entered By: Montey Hora on 03/27/2018 09:08:50 [name], [name] -------------------------------------------------------------------------------- Encounter Discharge Information Details Patient Name: [name], [name] Date of Service: 03/27/2018 8:30 AM Medical Record Number: [id number] Patient Account Number: [id number] Date of Birth/Sex: July 12, 1950 (67 y.o. M) Treating RN:  Montey Hora Primary Care Cortny Bambach: Lamonte Sakai Other Clinician: Referring Myldred Raju: Lamonte Sakai Treating Murel Shenberger/Extender: Melburn Hake, HOYT Weeks in Treatment: 42 Encounter Discharge Information Items Discharge Condition: Stable Ambulatory Status: Ambulatory Discharge Destination: Home Transportation: Private Auto Accompanied By: brother in law Schedule Follow-up Appointment: Yes Clinical Summary of Care: Electronic Signature(s) Signed: 03/27/2018 9:09:54 AM By: Montey Hora Entered By: Montey Hora on 03/27/2018 09:09:54 Daversa, [name] ([id number]) -------------------------------------------------------------------------------- Lower Extremity Assessment Details Patient Name: Jellison, Sosaia E. Date of Service: 03/27/2018 8:30 AM Medical Record Number: [id number] Patient Account Number: [id number] Date of Birth/Sex: May 29, 1950 (67 y.o. M) Treating RN: Cornell Barman Primary Care Leean Amezcua: Lamonte Sakai Other Clinician: Referring Kristena Wilhelmi: Lamonte Sakai Treating Evanie Buckle/Extender: Melburn Hake, HOYT Weeks in Treatment: 76 Vascular Assessment Pulses: Dorsalis Pedis Palpable: [Left:Yes] [Right:Yes] Posterior Tibial Extremity colors, hair growth, and conditions: Extremity Color: [Left:Hyperpigmented] [Right:Hyperpigmented] Hair Growth on Extremity: [Left:No] [Right:No] Temperature of Extremity: [Left:Warm] [Right:Warm] Capillary Refill: [Left:< 3 seconds] [Right:< 3 seconds] Toe Nail Assessment Left: Right: Thick: Yes Yes Discolored: Yes Yes Deformed: Yes Yes Improper Length and Hygiene: No No Electronic Signature(s) Signed: 03/27/2018 5:10:33 PM By: Gretta Cool, BSN, RN, CWS, Kim RN, BSN Entered By: Gretta Cool, BSN, RN, CWS, Kim on 03/27/2018 08:50:30 [name], [name] ([id number]) -------------------------------------------------------------------------------- Multi Wound Chart Details Patient Name: Gott, Leanthony E. Date of Service: 03/27/2018 8:30 AM Medical Record Number:  [id number] Patient Account Number: [id number] Date of Birth/Sex: Nov 30, 1950 (67 y.o. M) Treating RN: Montey Hora Primary Care Jadrien Narine: Lamonte Sakai Other Clinician: Referring Oma Marzan: Lamonte Sakai Treating Aaliyah Cancro/Extender: Melburn Hake, HOYT Weeks in Treatment: 49 Vital Signs Height(in): 69 Pulse(bpm): 74 Weight(lbs): 168 Blood Pressure(mmHg): 162/82 Body Mass Index(BMI): 25 Temperature(F): 98.2 Respiratory Rate 16 (breaths/min): Photos: [14:No Photos] [16:No Photos] [17:No Photos] Wound Location: [14:Left Toe Second] [16:Right Toe Great - Plantar] [17:Right Metatarsal head second - Plantar, Distal] Wounding Event: [14:Gradually Appeared] [16:Not Known] [  17:Trauma] 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] [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] [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] Comorbid History: [14:Anemia, Lymphedema, Congestive Heart Failure, Hypertension, Peripheral Venous Disease, Type II Diabetes, Neuropathy] [16:Anemia, Lymphedema, Congestive Heart Failure, Hypertension, Peripheral Venous Disease, Type II Diabetes,  Neuropathy] [17:Anemia, Lymphedema, Congestive Heart Failure, Hypertension, Peripheral Venous Disease, Type II Diabetes, Neuropathy] Date Acquired: [14:09/26/2017] [16:10/29/2017] [17:02/03/2018] Weeks of Treatment: [14:26] [16:21] [17:7] Wound Status: [14:Open] [16:Open] [17:Open] Pending Amputation on [14:No] [16:No] [17:No] Presentation: Measurements L x W x D [14:0.7x0.7x0.1] [16:0.7x0.8x0.1] [17:1.2x0.6x0.1] (cm) Area (cm) : [14:0.385] [16:0.44] [17:0.565] Volume (cm) : [14:0.038] [16:0.044] [17:0.057] % Reduction in Area: [14:83.70%] [16:72.50%] [17:-60.10%] % Reduction in Volume: [14:83.90%] [16:72.50%] [17:-62.90%] Classification: [14:Grade 1] [16:Grade 1] [17:Grade 1] Exudate Amount: [14:Medium] [16:Medium] [17:Medium] Exudate Type: [14:Serous] [16:Serous] [17:Serous] Exudate Color: [14:amber]  [16:amber] [17:amber] Wound Margin: [14:Flat and Intact] [16:Flat and Intact] [17:Flat and Intact] Granulation Amount: [14:Large (67-100%)] [16:Large (67-100%)] [17:Small (1-33%)] Granulation Quality: [14:Pink] [16:Pink] [17:Pink] Necrotic Amount: [14:Small (1-33%)] [16:Small (1-33%)] [17:Large (67-100%)] Exposed Structures: [14:Fat Layer (Subcutaneous Tissue) Exposed: Yes Fascia: No Tendon: No Muscle: No Joint: No Bone: No] [16:Fat Layer (Subcutaneous Tissue) Exposed: Yes Fascia: No Tendon: No Muscle: No Joint: No Bone: No] [17:Fat Layer (Subcutaneous Tissue) Exposed: Yes  Fascia: No Tendon: No Muscle: No Joint: No Bone: No] Epithelialization: Medium (34-66%) None None Periwound Skin Texture: Excoriation: No Excoriation: No Excoriation: No Induration: No Induration: No Induration: No Callus: No Callus: No Callus: No Crepitus: No Crepitus: No Crepitus: No Rash: No Rash: No Rash: No Scarring: No Scarring: No Scarring: No Periwound Skin Moisture: Maceration: No Maceration: No Maceration: No Dry/Scaly: No Dry/Scaly: No Dry/Scaly: No Periwound Skin Color: Atrophie Blanche: No Atrophie Blanche: No Atrophie Blanche: No Cyanosis: No Cyanosis: No Cyanosis: No Ecchymosis: No Ecchymosis: No Ecchymosis: No Erythema: No Erythema: No Erythema: No Hemosiderin Staining: No Hemosiderin Staining: No Hemosiderin Staining: No Mottled: No Mottled: No Mottled: No Pallor: No Pallor: No Pallor: No Rubor: No Rubor: No Rubor: No Temperature: N/A No Abnormality No Abnormality Tenderness on Palpation: No No No Wound Preparation: Ulcer Cleansing: Ulcer Cleansing: Ulcer Cleansing: Rinsed/Irrigated with Saline Rinsed/Irrigated with Saline Rinsed/Irrigated with Saline Topical Anesthetic Applied: Topical Anesthetic Applied: Topical Anesthetic Applied: None None Other: lidocaine 4% Wound Number: 2 N/A N/A Photos: No Photos N/A N/A Wound Location: Right Toe Second N/A  N/A Wounding Event: Gradually Appeared N/A N/A Primary Etiology: Diabetic Wound/Ulcer of the N/A N/A Lower Extremity Comorbid History: Anemia, Lymphedema, N/A N/A Congestive Heart Failure, Hypertension, Peripheral Venous Disease, Type II Diabetes, Neuropathy Date Acquired: 06/11/2016 N/A N/A Weeks of Treatment: 78 N/A N/A Wound Status: Open N/A N/A Pending Amputation on Yes N/A N/A Presentation: Measurements L x W x D 0.2x0.9x0.1 N/A N/A (cm) Area (cm) : 0.141 N/A N/A Volume (cm) : 0.014 N/A N/A % Reduction in Area: 95.40% N/A N/A % Reduction in Volume: 95.40% N/A N/A Classification: Grade 2 N/A N/A Exudate Amount: Medium N/A N/A Exudate Type: Serous N/A N/A Exudate Color: amber N/A N/A Wound Margin: Flat and Intact N/A N/A Granulation Amount: Medium (34-66%) N/A N/A Granulation Quality: Pink N/A N/A Necrotic Amount: Medium (34-66%) N/A N/A Exposed Structures: Fat Layer (Subcutaneous N/A N/A Tissue) Exposed: Yes Fascia: No [name], [name] ([id number]) Tendon: No Muscle: No Joint: No Bone: No Epithelialization: None N/A N/A Periwound Skin Texture: Excoriation: No N/A N/A Induration: No Callus: No Crepitus: No Rash: No Scarring: No Periwound Skin Moisture: Maceration: No N/A N/A Dry/Scaly: No Periwound Skin Color: Atrophie Blanche:  No N/A N/A Cyanosis: No Ecchymosis: No Erythema: No Hemosiderin Staining: No Mottled: No Pallor: No Rubor: No Temperature: No Abnormality N/A N/A Tenderness on Palpation: No N/A N/A Wound Preparation: Ulcer Cleansing: N/A N/A Rinsed/Irrigated with Saline Topical Anesthetic Applied: None Treatment Notes Electronic Signature(s) Signed: 03/28/2018 5:12:41 PM By: Montey Hora Entered By: Montey Hora on 03/27/2018 09:02:40 [name], [name] ([id number]) -------------------------------------------------------------------------------- [place] Details Patient Name: Mcwherter, Aedin E. Date of Service:  03/27/2018 8:30 AM Medical Record Number: [id number] Patient Account Number: [id number] Date of Birth/Sex: 1950-10-19 (67 y.o. M) Treating RN: Montey Hora Primary Care Cashmere Dingley: Lamonte Sakai Other Clinician: Referring Onesti Bonfiglio: Lamonte Sakai Treating Carnetta Losada/Extender: Melburn Hake, HOYT Weeks in Treatment: 62 Active Inactive Abuse / Safety / Falls / Self Care Management Nursing Diagnoses: Potential for falls Goals: Patient will remain injury free related to falls Date Initiated: 09/21/2016 Target Resolution Date: 09/14/2017 Goal Status: Active Interventions: Assess fall risk on admission and as needed Notes: Nutrition Nursing Diagnoses: Potential for alteratiion in Nutrition/Potential for imbalanced nutrition Goals: Patient/caregiver agrees to and verbalizes understanding of need to use nutritional supplements and/or vitamins as prescribed Date Initiated: 09/21/2016 Target Resolution Date: 09/14/2017 Goal Status: Active Interventions: Assess patient nutrition upon admission and as needed per policy Notes: Orientation to the Wound Care Program Nursing Diagnoses: Knowledge deficit related to the wound healing center program Goals: Patient/caregiver will verbalize understanding of the [facility] Date Initiated: 09/21/2016 Target Resolution Date: 06/15/2017 Goal Status: Active Interventions: Provide education on orientation to the wound center Wohlfarth, Anzel E. ([id number]) Notes: Wound/Skin Impairment Nursing Diagnoses: Knowledge deficit related to smoking impact on wound healing Goals: Ulcer/skin breakdown will have a volume reduction of 30% by week 4 Date Initiated: 09/21/2016 Target Resolution Date: 08/17/2017 Goal Status: Active Ulcer/skin breakdown will have a volume reduction of 50% by week 8 Date Initiated: 09/21/2016 Target Resolution Date: 08/17/2017 Goal Status: Active Ulcer/skin breakdown will have a volume reduction of 80% by week 12 Date Initiated:  09/21/2016 Target Resolution Date: 09/14/2017 Goal Status: Active Ulcer/skin breakdown will heal within 14 weeks Date Initiated: 09/21/2016 Target Resolution Date: 08/17/2017 Goal Status: Active Interventions: Assess patient/caregiver ability to obtain necessary supplies Assess patient/caregiver ability to perform ulcer/skin care regimen upon admission and as needed Assess ulceration(s) every visit Notes: Electronic Signature(s) Signed: 03/28/2018 5:12:41 PM By: Montey Hora Entered By: Montey Hora on 03/27/2018 09:02:32 [name], [name] Kitchen ([id number]) -------------------------------------------------------------------------------- Pain Assessment Details Patient Name: Friley, Zamarion E. Date of Service: 03/27/2018 8:30 AM Medical Record Number: [id number] Patient Account Number: [id number] Date of Birth/Sex: Feb 02, 1951 (67 y.o. M) Treating RN: Primary Care Shaquira Moroz: Lamonte Sakai Other Clinician: Referring Janee Ureste: Lamonte Sakai Treating Tykesha Konicki/Extender: Melburn Hake, HOYT Weeks in Treatment: 37 Active Problems Location of Pain Severity and Description of Pain Patient Has Paino No Site Locations Pain Management and Medication Current Pain Management: Electronic Signature(s) Signed: 03/27/2018 3:46:06 PM By: Lorine Bears RCP, RRT, CHT Entered By: Lorine Bears on 03/27/2018 28:36:62 [name], [name] ([id number]) -------------------------------------------------------------------------------- Patient/Caregiver Education Details Patient Name: Spitler, Kingsly E. Date of Service: 03/27/2018 8:30 AM Medical Record Number: [id number] Patient Account Number: [id number] Date of Birth/Gender: 01-13-1951 (67 y.o. M) Treating RN: Montey Hora Primary Care Physician: Lamonte Sakai Other Clinician: Referring Physician: Lamonte Sakai Treating Physician/Extender: Sharalyn Ink in Treatment: 71 Education Assessment Education Provided To: Patient and  Caregiver Education Topics Provided Wound/Skin Impairment: Handouts: Other: wound care as ordered Methods: Demonstration, Explain/Verbal Responses: State content correctly Electronic Signature(s) Signed: 03/28/2018 5:12:41 PM By: Marjory Lies,  Di Kindle Entered By: Montey Hora on 03/27/2018 09:09:17 [name], [name] ([id number]) -------------------------------------------------------------------------------- Wound Assessment Details Patient Name: Sanguinetti, JHONNIE ALIANO. Date of Service: 03/27/2018 8:30 AM Medical Record Number: [id number] Patient Account Number: [id number] Date of Birth/Sex: 08/25/1950 (67 y.o. M) Treating RN: Cornell Barman Primary Care Gaia Gullikson: Lamonte Sakai Other Clinician: Referring Pamela Intrieri: Lamonte Sakai Treating Jaideep Pollack/Extender: Melburn Hake, HOYT Weeks in Treatment: 77 Wound Status Wound Number: 14 Primary Diabetic Wound/Ulcer of the Lower Extremity Etiology: Wound Location: Left Toe Second Wound Open Wounding Event: Gradually Appeared Status: Date Acquired: 09/26/2017 Comorbid Anemia, Lymphedema, Congestive Heart Weeks Of Treatment: 26 History: Failure, Hypertension, Peripheral Venous Clustered Wound: No Disease, Type II Diabetes, Neuropathy Photos Photo Uploaded By: Gretta Cool, BSN, RN, CWS, Kim on 03/27/2018 16:56:23 Wound Measurements Length: (cm) 0.7 Width: (cm) 0.7 Depth: (cm) 0.1 Area: (cm) 0.385 Volume: (cm) 0.038 % Reduction in Area: 83.7% % Reduction in Volume: 83.9% Epithelialization: Medium (34-66%) Tunneling: No Undermining: No Wound Description Classification: Grade 1 Foul Odor Wound Margin: Flat and Intact Slough/Fi Exudate Amount: Medium Exudate Type: Serous Exudate Color: amber After Cleansing: No brino Yes Wound Bed Granulation Amount: Large (67-100%) Exposed Structure Granulation Quality: Pink Fascia Exposed: No Necrotic Amount: Small (1-33%) Fat Layer (Subcutaneous Tissue) Exposed: Yes Necrotic Quality: Adherent Slough Tendon Exposed:  No Muscle Exposed: No Joint Exposed: No Bone Exposed: No Periwound Skin Texture Zecca, Lukka E. ([id number]) Texture Color No Abnormalities Noted: No No Abnormalities Noted: No Callus: No Atrophie Blanche: No Crepitus: No Cyanosis: No Excoriation: No Ecchymosis: No Induration: No Erythema: No Rash: No Hemosiderin Staining: No Scarring: No Mottled: No Pallor: No Moisture Rubor: No No Abnormalities Noted: No Dry / Scaly: No Maceration: No Wound Preparation Ulcer Cleansing: Rinsed/Irrigated with Saline Topical Anesthetic Applied: None Treatment Notes Wound #14 (Left Toe Second) Notes silvercel and tape Electronic Signature(s) Signed: 03/27/2018 5:10:33 PM By: Gretta Cool, BSN, RN, CWS, Kim RN, BSN Entered By: Gretta Cool, BSN, RN, CWS, Kim on 03/27/2018 08:48:01 [name], [name] ([id number]) -------------------------------------------------------------------------------- Wound Assessment Details Patient Name: Salah, Alroy E. Date of Service: 03/27/2018 8:30 AM Medical Record Number: [id number] Patient Account Number: [id number] Date of Birth/Sex: 1950/09/12 (67 y.o. M) Treating RN: Cornell Barman Primary Care Oriana Horiuchi: Lamonte Sakai Other Clinician: Referring Trenika Hudson: Lamonte Sakai Treating Fayth Trefry/Extender: Melburn Hake, HOYT Weeks in Treatment: 59 Wound Status Wound Number: 16 Primary Diabetic Wound/Ulcer of the Lower Extremity Etiology: Wound Location: Right Toe Great - Plantar Wound Open Wounding Event: Not Known Status: Date Acquired: 10/29/2017 Comorbid Anemia, Lymphedema, Congestive Heart Weeks Of Treatment: 21 History: Failure, Hypertension, Peripheral Venous Clustered Wound: No Disease, Type II Diabetes, Neuropathy Photos Photo Uploaded By: Gretta Cool, BSN, RN, CWS, Kim on 03/27/2018 16:56:24 Wound Measurements Length: (cm) 0.7 Width: (cm) 0.8 Depth: (cm) 0.1 Area: (cm) 0.44 Volume: (cm) 0.044 % Reduction in Area: 72.5% % Reduction in Volume:  72.5% Epithelialization: None Tunneling: No Undermining: No Wound Description Classification: Grade 1 Foul Odor Wound Margin: Flat and Intact Slough/Fi Exudate Amount: Medium Exudate Type: Serous Exudate Color: amber After Cleansing: No brino Yes Wound Bed Granulation Amount: Large (67-100%) Exposed Structure Granulation Quality: Pink Fascia Exposed: No Necrotic Amount: Small (1-33%) Fat Layer (Subcutaneous Tissue) Exposed: Yes Necrotic Quality: Adherent Slough Tendon Exposed: No Muscle Exposed: No Joint Exposed: No Bone Exposed: No Periwound Skin Texture Mancha, Eligio E. ([id number]) Texture Color No Abnormalities Noted: No No Abnormalities Noted: No Callus: No Atrophie Blanche: No Crepitus: No Cyanosis: No Excoriation: No Ecchymosis: No Induration: No Erythema: No Rash: No Hemosiderin Staining: No Scarring: No Mottled: No  Pallor: No Moisture Rubor: No No Abnormalities Noted: No Dry / Scaly: No Temperature / Pain Maceration: No Temperature: No Abnormality Wound Preparation Ulcer Cleansing: Rinsed/Irrigated with Saline Topical Anesthetic Applied: None Treatment Notes Wound #16 (Right, Plantar Toe Great) Notes silvercel and tape Electronic Signature(s) Signed: 03/27/2018 5:10:33 PM By: Gretta Cool, BSN, RN, CWS, Kim RN, BSN Entered By: Gretta Cool, BSN, RN, CWS, Kim on 03/27/2018 08:48:29 [name], [name] ([id number]) -------------------------------------------------------------------------------- Wound Assessment Details Patient Name: Hoskie, Seab E. Date of Service: 03/27/2018 8:30 AM Medical Record Number: [id number] Patient Account Number: [id number] Date of Birth/Sex: 1950-06-19 (67 y.o. M) Treating RN: Cornell Barman Primary Care Tyianna Menefee: Lamonte Sakai Other Clinician: Referring Jessie Cowher: Lamonte Sakai Treating Frieda Arnall/Extender: Melburn Hake, HOYT Weeks in Treatment: 61 Wound Status Wound Number: 17 Primary Diabetic Wound/Ulcer of the Lower  Extremity Etiology: Wound Location: Right Metatarsal head second - Plantar, Distal Wound Open Status: Wounding Event: Trauma Comorbid Anemia, Lymphedema, Congestive Heart Date Acquired: 02/03/2018 History: Failure, Hypertension, Peripheral Venous Weeks Of Treatment: 7 Disease, Type II Diabetes, Neuropathy Clustered Wound: No Photos Photo Uploaded By: Gretta Cool, BSN, RN, CWS, Kim on 03/27/2018 16:57:03 Wound Measurements Length: (cm) 1.2 Width: (cm) 0.6 Depth: (cm) 0.1 Area: (cm) 0.565 Volume: (cm) 0.057 % Reduction in Area: -60.1% % Reduction in Volume: -62.9% Epithelialization: None Tunneling: No Undermining: No Wound Description Classification: Grade 1 Wound Margin: Flat and Intact Exudate Amount: Medium Exudate Type: Serous Exudate Color: amber Foul Odor After Cleansing: No Slough/Fibrino Yes Wound Bed Granulation Amount: Small (1-33%) Exposed Structure Granulation Quality: Pink Fascia Exposed: No Necrotic Amount: Large (67-100%) Fat Layer (Subcutaneous Tissue) Exposed: Yes Necrotic Quality: Adherent Slough Tendon Exposed: No Muscle Exposed: No Joint Exposed: No Bone Exposed: No Periwound Skin Texture Gindlesperger, Yesenia E. ([id number]) Texture Color No Abnormalities Noted: No No Abnormalities Noted: No Callus: No Atrophie Blanche: No Crepitus: No Cyanosis: No Excoriation: No Ecchymosis: No Induration: No Erythema: No Rash: No Hemosiderin Staining: No Scarring: No Mottled: No Pallor: No Moisture Rubor: No No Abnormalities Noted: No Dry / Scaly: No Temperature / Pain Maceration: No Temperature: No Abnormality Wound Preparation Ulcer Cleansing: Rinsed/Irrigated with Saline Topical Anesthetic Applied: Other: lidocaine 4%, Treatment Notes Wound #17 (Right, Distal, Plantar Metatarsal head second) Notes silvercel and tape Electronic Signature(s) Signed: 03/27/2018 5:10:33 PM By: Gretta Cool, BSN, RN, CWS, Kim RN, BSN Entered By: Gretta Cool, BSN, RN, CWS, Kim on  03/27/2018 08:49:17 Umbaugh, [name] ([id number]) -------------------------------------------------------------------------------- Wound Assessment Details Patient Name: Demicco, Ozias E. Date of Service: 03/27/2018 8:30 AM Medical Record Number: [id number] Patient Account Number: [id number] Date of Birth/Sex: [birth year] (67 y.o. M) Treating RN: Cornell Barman Primary Care Loucille Takach: Lamonte Sakai Other Clinician: Referring Boss Danielsen: Lamonte Sakai Treating Rechy Bost/Extender: Melburn Hake, HOYT Weeks in Treatment: 2 Wound Status Wound Number: 2 Primary Diabetic Wound/Ulcer of the Lower Extremity Etiology: Wound Location: Right Toe Second Wound Open Wounding Event: Gradually Appeared Status: Date Acquired: 06/11/2016 Comorbid Anemia, Lymphedema, Congestive Heart Weeks Of Treatment: 78 History: Failure, Hypertension, Peripheral Venous Clustered Wound: No Disease, Type II Diabetes, Neuropathy Pending Amputation On Presentation Photos Photo Uploaded By: Gretta Cool, BSN, RN, CWS, Kim on 03/27/2018 16:57:03 Wound Measurements Length: (cm) 0.2 Width: (cm) 0.9 Depth: (cm) 0.1 Area: (cm) 0.141 Volume: (cm) 0.014 % Reduction in Area: 95.4% % Reduction in Volume: 95.4% Epithelialization: None Tunneling: No Undermining: No Wound Description Classification: Grade 2 Foul Odor Wound Margin: Flat and Intact Slough/Fi Exudate Amount: Medium Exudate Type: Serous Exudate Color: amber After Cleansing: No brino Yes Wound Bed Granulation Amount: Medium (34-66%) Exposed Structure Granulation  Quality: Pink Fascia Exposed: No Necrotic Amount: Medium (34-66%) Fat Layer (Subcutaneous Tissue) Exposed: Yes Necrotic Quality: Adherent Slough Tendon Exposed: No Muscle Exposed: No Joint Exposed: No Bone Exposed: No Periwound Skin Texture Veasey, Jacen E. ([id number]) Texture Color No Abnormalities Noted: No No Abnormalities Noted: No Callus: No Atrophie Blanche: No Crepitus: No Cyanosis:  No Excoriation: No Ecchymosis: No Induration: No Erythema: No Rash: No Hemosiderin Staining: No Scarring: No Mottled: No Pallor: No Moisture Rubor: No No Abnormalities Noted: No Dry / Scaly: No Temperature / Pain Maceration: No Temperature: No Abnormality Wound Preparation Ulcer Cleansing: Rinsed/Irrigated with Saline Topical Anesthetic Applied: None Treatment Notes Wound #2 (Right Toe Second) Notes silvercel and tape Electronic Signature(s) Signed: 03/27/2018 5:10:33 PM By: Gretta Cool, BSN, RN, CWS, Kim RN, BSN Entered By: Gretta Cool, BSN, RN, CWS, Kim on 03/27/2018 08:49:51 Mccroskey, [name] ([id number]) -------------------------------------------------------------------------------- Vitals Details Patient Name: Courts, Rickie E. Date of Service: 03/27/2018 8:30 AM Medical Record Number: [id number] Patient Account Number: [id number] Date of Birth/Sex: May 09, 1950 (67 y.o. M) Treating RN: Primary Care Coleman Kalas: Lamonte Sakai Other Clinician: Referring Jakaleb Payer: Lamonte Sakai Treating Annelies Coyt/Extender: Melburn Hake, HOYT Weeks in Treatment: 95 Vital Signs Time Taken: 08:30 Temperature (F): 98.2 Height (in): 69 Pulse (bpm): 74 Weight (lbs): 168 Respiratory Rate (breaths/min): 16 Body Mass Index (BMI): 24.8 Blood Pressure (mmHg): 162/82 Reference Range: 80 - 120 mg / dl Notes Inquired as to why patient's blood pressure is a little high - MD has increased his Hydralazine 50 mg. two times daily. Patient also had been drinking [place]. Dew this morning. Electronic Signature(s) Signed: 03/27/2018 3:46:06 PM By: Lorine Bears RCP, RRT, CHT Entered By: Lorine Bears on 03/27/2018 08:36:52

## 2018-03-30 NOTE — Progress Notes (Signed)
[name] ([id number]) Visit Report for 03/27/2018 Chief Complaint Document Details Patient Name: [name], [name] Date of Service: 03/27/2018 8:30 AM Medical Record Number: [id number] Patient Account Number: [id number] Date of Birth/Sex: [birth year] (67 y.o. M) Treating RN: Primary Care Provider: Lamonte Sakai Other Clinician: Referring Provider: Lamonte Sakai Treating Provider/Extender: Melburn Hake, HOYT Weeks in Treatment: 64 Information Obtained from: Patient Chief Complaint Patient presents for treatment of an open diabetic ulcer to both feet Electronic Signature(s) Signed: 03/27/2018 5:48:37 PM By: Worthy Keeler PA-C Entered By: Worthy Keeler on 03/27/2018 08:29:01 Milleson, [name] ([id number]) -------------------------------------------------------------------------------- HPI Details Patient Name: [name], [name] Date of Service: 03/27/2018 8:30 AM Medical Record Number: [id number] Patient Account Number: [id number] Date of Birth/Sex: 04-21-1950 (67 y.o. M) Treating RN: Primary Care Provider: Lamonte Sakai Other Clinician: Referring Provider: Lamonte Sakai Treating Provider/Extender: Melburn Hake, HOYT Weeks in Treatment: 67 History of Present Illness Location: bilateral feet ulceration on the toes Quality: Patient reports experiencing a dull pain to affected area(s). Severity: Patient states wound are getting better Duration: Patient has had the wound for > 3 months prior to seeking treatment at the wound center Timing: Pain in wound is constant (hurts all the time) Context: The wound would happen gradually Modifying Factors: Other treatment(s) tried include:treatment for lymphedema and is seen by the podiatrist Dr. Caryl Comes Associated Signs and Symptoms: Patient reports having increase swelling. HPI Description: 67 year old patient here to see as for bilateral feet ulceration to on his left first and second toe and 2 on his right first and second toe, which she's had for about 4  months. 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 Donella Stade. The patient is also undergoing workup by medical oncology for a intra-abdominal lymphadenopathy. 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 He is also status post appendectomy, inguinal lymph node biopsy, rectal biopsy and rectal examination under anesthesia. he currently smokes cigarettes about half packet a day. In March of this year he was seen by Dr. Hortencia Pilar, for evaluation of bilateral varicose veins and besides wearing compression stockings he had recommended laser ablation of the right and left great saphenous veins to eleviate the symptoms and complications of severe superficial venous reflux disease. He also recommended lymphedema pumps for better control of his lymphedema. The patient recently has had on 08/23/2016, right greater saphenous vein ablation with the laser energy Earlier lower extremity venous reflux examination done on 05/08/2016 showed no DVT or SVT both lower legs but incompetence of bilateral great saphenous veins was present. A lower arterial study was also done and there was no significant right lower and left lower extremity problems based on a normal toe brachial index bilaterally and the ABI was 1.21 the left and 1.23 on the right. His post ablation venous duplex examination showed successful ablation of the right GS vein with thrombus formation 2 below the right saphenofemoral junction. The deep system was patent without evidence of thrombosis and this was done on 08/30/2016. 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 Addendum: regarding his  x-rays done today and x-ray of the left foot -- IMPRESSION: No objective evidence of osteomyelitis. There are soft tissue changes  which may reflect cellulitis. X-ray of the right foot -- IMPRESSION:Findings compatible with cellulitis of the toes. No objective evidence of osteomyelitis is observed. 10/01/16 on evaluation today patient's wounds appeared to be doing some better. I did review the x-rays as well which showed no evidence of osteomyelitis although there was evidence on x-ray of cellulitis. He fortunately is not having any discomfort although he continues to have some swelling. He does not remember being on any antibiotics recently. 10/15/16 on evaluation today patient's wounds overall appear to be doing better although he does have a new location noted on the left foot. Fortunately he is not having significant pain. It almost has the appearance that something is rubbing on the end of his toes but he wears the open toe shoes and according to what he is telling me never wears anything that would rub on his foot. There is no evidence of infection and specifically no evidence of a fungal infection 10/22/16 On evaluation today patient's wounds appeared to be doing better compared to last week in regard to his bilateral Evinger, [name] ([id number]) lower extremities. Fortunately I happy with how things are progressing although he still has ulcers I feel like that he is improving and appropriate manner. 11/12/16 on evaluation today patient appears to be doing well in regard to his bilateral feet and the respective wounds. We have been using surrounding her dressings along with an antifungal cream which seems to be doing very well. He has no bilateral dysfunction noticed that the rituals are weight loss at this point. He also has no nausea or vomiting a note purulent discharge. He did see Vein and vascular today and he tells me that they told him he could have surgery for his venous stasis but they  did not feel like it was worth it in his words. Fortunately patient's wounds do appear to be getting sneakily better. 11/26/2016 -- he says he is going to have some surgery during this week at [facility] for possibly a colon resection. 12/31/2016 -- the patient has been noncompliant with his smoking and I'm not sure whether he is also started drinking again. He continues to be very nonchalant about his care 01/14/2017 -- the patient's HandP has been reviewed well and I understand he is being compliant with trying to give up smoking and his local dressing changes. He does not have any surgical options of 4 to him by his vascular surgeons.he was last seen in early August by Dr. Hortencia Pilar who recommended compression stockings,and possibly lymph pumps in 2-3 months after doing a review ultrasound. 01/28/2017 - the patient did not have any fresh complaints but on examination I noted a large lacerated wound on the plantar aspect of his right fourth toe which had a lot of necrotic debris and it probes down to bone. 02/07/2017 -- x-ray of the right foot -- IMPRESSION: Soft tissue swelling about the first through fourth toes consistent with cellulitis. New destructive change in the tuft of the distal phalanx of the great toe is consistent with osteomyelitis. 02/14/2017 -- the patient's MRI is pending this coming Monday and he still continues to smoke. We have again gone over off loading of his wounds in great detail and he says he's been compliant. 02/21/2017 -- MR of the right foot -- IMPRESSION: 1. Soft tissue ulcer at the tip of the first, second and third toe knows. Cortical irregularity and bone marrow edema in the first distal phalanx most concerning for osteomyelitis. Mild marrow  edema in the second and third distal phalanx without definite cortical destruction which may reflect early osteomyelitis versus reactive marrow edema. 2. Soft tissue edema surrounding the first phalanx most  consistent with cellulitis. the patient was also recently evaluated by his medical oncologist Dr. 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 She is treating him with weekly Procrit. She is also keeping intra-abdominal lymphadenopathy and right lower lobe lung nodule under observation. 04/04/2017 -- he was seen by Dr. Adrian Prows on 03/25/2017 -- after review he empirically put him on ciprofloxacin and doxycycline as they have good bone penetration and good bioavailability and it will cover the usual pathogens and diabetic foot osteomyelitis. He will check inflammatory markers and plan a 67-17 week old records. C-reactive protein was 0.3 and the ESR was 72 04/18/17 on evaluation today patient appears to be doing about the same in regard to his lower extremity wounds bilaterally. He has continued to use the antifungal cream which does seem to be beneficial. Nonetheless the ulcers do seem to in some areas be epithelial eyes over and in other areas are still open. He is having no significant discomfort. 04/25/17-he is here in follow-up evaluation for multiple ulcerations to multiple toes bilaterally. He states he did see Dr. Ola Spurr again last week and continues antibiotic therapy. He is voicing no complaints or concerns, will continue with current treatment plan will possibility of adding compression therapy next week after an additional week of treatment/lotions to BLE prescribed by Dr Ola Spurr 05/02/17 he is here in follow up for for multiple ulcers to multiple toes bilaterally. we will stop using antifungal cream and will continue with silvercel and follow up next week 05/09/17-he is here in follow-up for multiple ulcerations to multiple toes bilaterally. There is improvement in appearance. He has not completely stopped using antifungal cream, but admits he has not using it between the toes. He has an appointment with Dr.  Ola Spurr on 2/11, continues on doxycycline and Cipro. It has been 5 weeks of antibiotic therapy, we will order plain film xray to evaluate for osteomyelitis next week, prior to follow up with ID. Will continue with silvercel and follow up next week 05/16/17-he is here in follow-up evaluation for multiple ulcerations to multiple toes bilaterally and new wound to the right posterior heel. There is essentially no change in appearance, deteriorating measurements; he has a history of waxing and waning measurements. He admits that he continues to apply moisturizer/cream/ointment to his toes despite weekly reminders to only apply silvercel to his toes. He states that he thinks the surgical shoe contributed to the superficial ulcer to his posterior Kluesner, Aquila E. ([id number]) heel, he is unable to articulate if this was an area of dry cracked skin as he has a similar area to the left heel. He now is wearing open toed slippers. He has an appointment with Dr. Ola Spurr on 2/11. We have ordered x-rays for her bilateral feet; he was advised to obtain the x-rays today or tomorrow. He will follow-up next week 05/23/17-he is here in follow-up evaluation for multiple ulcerations to multiple toes bilaterally and the right posterior heel. There is improvement in maceration. He has been compliant and not applying any moisturizing agent to his toes. He has been using Lac-Hydrin for his lower extremities with improvement. He did not go to his appointment on Monday with Dr. Ola Spurr secondary to financial concerns. X-rays for her bilateral feet showed: LEFT FOOT with  slight erosion of the tuft of the distal phalanges of the left first and second toe suspicious for osteomyelitis, RIGHT FOOT with 1.erosion of the tufts of the distal phalanges of the right first second and possibly third toes consistent with osteomyelitis, 2 no definitive abnormality of the calcaneus is seen on the images obtained, 3. Plantar calcaneal  degenerative spur. We briefly discussed hyperbaric adjunctive therapy for treatment of chronic refractory osteomyelitis. I do not find an a1c in EMR, will contact PCP for record, or order if needed. He has been encouraged to contact Dr Ola Spurr office regarding the follow-up appointment, encouraged him to inquire about payment plan. We will continue with same treatment plan and follow-up next week. He states he is still taking antibiotics and has "a lot" left. He states he has been taking them as directed, 2 pills twice daily. According to Dr. Blane Ohara office notes he was originally started on 12/17 for 4 weeks and extended on 1/14 for an additional 4 weeks. He should be done with his antibiotic therapy, he was advised to bring his bottles and to his next appointment, we will contact pharmacy. 05/30/17-he is here in follow-up evaluation for multiple ulcerations to multiple toes bilaterally and the right posterior heel. He is accompanied by his brother-in-law. Wounds are stable. He has yet to make up with Dr. Ola Spurr. We contacted his PCP, with no record of recent A1c we will draw an A1c. His brother-in-law states that he was taken off all of his diabetic medication secondary to kidney function. 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 The antibiotics were counted and have approximately 2 weeks left, although they should be complete. The pill organizer reveals missing days. We discussed the need for consistent medications, to have optimal benefit of medication. He has a cousin that lives with him and he will ask her to check his organizer daily. He has been advised to follow up with Dr Ola Spurr, and will go by the office today. He has been advised to quit smoking. 06/06/17-he is here in follow up evaluation. He has had to make an appointment with Dr. Ola Spurr. He did have blood  work obtained, a1c 5. He continues to take antibiotic therapy. Significant improvement in bilateral lower extremity edema with compression therapy. Essentially no change in ulcerations to toes. He states he is "going to try something different" and "let me know next week" if it works; he would not provide any additional information and was encouraged to follow our orders. We will follow up next week 06/13/17-he is here in follow-up evaluation. He has an appointment with Dr. Ola Spurr tomorrow morning. He states he purchased an ointment from [facility] and applied to his toes for 3 days, he does not remember the name of the ointment. There is improvement to his wounds, minimal maceration. He continues to take antibiotic therapy, this should have been completed last month. His brother-in-law who regularly accompanies his appointments was asked to take the bottles to the appointment tomorrow with Dr. Ola Spurr so he is aware. We will continue with 3 layer compression, and order OPEN TOE compression 20-30mmHg; we will apply compression stockings next week. He continues to smoke, smoked "2 cigarettes" last week 06/20/17 on evaluation today patient did receive his compression stockings which he has with him today for both lower extremities. With that being said he tells me at this point in time that he is very happy to have these  he really is not a big fan of the compression wraps that we have been utilizing although they have been of great benefit for him. Nonetheless at this point he does want to switch to the compression stockings. In my opinion as long as he is continuing with compression I'm okay with the stockings or the wraps. 06/27/17-he is here in follow-up evaluation for multiple ulcerations to his bilateral toes. There is some improvement in appearance. He is compliant in wearing his compression stockings with significant improvement in lower extremity edema. He saw Dr Ola Spurr on 3/8, per his  notes they would redraw ESR and CRP; plan to continue antibiotic therapy if these remain elevated. I do not see an ESR or CRP level in Epic. The patient continues to take antibiotics. 07/11/17-He is here in follow-up evaluation for multiple ulcerations to multiple toes bilaterally. He presents with complete epithelialization to the right third toe; there has been no deterioration. He continues on antibiotic therapy. He will follow-up next week 07/18/17-He is here in follow-up evaluation for multiple ulcerations to multiple toes bilaterally. He continues to make improvement. He continues on antibiotic therapy. He states he has been using something additional to our orders, he does not elaborate but states he will bring it in next week. 07/25/17-He is here in follow up evaluation for multiple ulcerations to bilateral toes. He is stable. He has completed antibiotic therapy. He admits to "filing" his toes after showers each evening, this is what he was referencing last week; he does not filing for the wounds. We will switch to [facility] and monitor for any improvement, he will follow-up next week 08/01/17-He is here in follow-up evaluation. He admits to "picking" at his toes after cleansing yesterday, leading to new areas of tissue loss on the bilateral second toe. There is improvement noted to the bilateral great toe. We will dress toes today and hope that they maintain until Monday where he will come in for a nurse visit. He has been advised, multiple times with expressed verbalization, to change the dressings to silvercel if the dressings get wet prior to [facility] appointment. CATARINO, VOLD ([id number]) 08/08/17-He is here in follow-up evaluation for bilateral first and second toe ulcerations. There is significant improvement to all ulcerations since last visit. We will switch to [facility] to all wounds and he will continue with nurse visits on a Monday/Thursday schedule and follow-up with me in  2 weeks. He continues to smoke, 1-3 cigarettes per day, and has been encouraged to not smoke until his next follow-up in 2 weeks. 08/15/17 on evaluation today patient's ulcers on his toes actually appear to be doing fairly well the right to ulcers may be a little bit more moist compared to the left I'm not really sure exactly why as the openings appear to be very small and I'm not seeing any evidence of anything significant as far as you lightest or otherwise. Nonetheless this may just be a small setback he's been doing very well with the [facility] Dressing 08/22/17-He is here in follow-up evaluation for ulcerations to his bilateral first and second toes, there is small/scant amount of drainage noted on today's dressing. He continues with Hydrofera Blue. He continues to smoke, 1 cigarette a day. Voices no complaint or concerns, compliant and compression therapy today. He'll follow-up next week 08/29/17-He is here in follow-up evaluation for ulcerations to his bilateral first and second toes, with a new wound to the left third toe. He states he cut himself while  cutting the toenail. He presents today with more maceration and increased measurements; waxing and waning measurements and moisture has been an ongoing issue. He continues to smoke a proximally 1 cigarette per day, but states he has not smoked since Sunday. We will initiate medihoney daily and evaluate next week. He presents today without compression stockings 09/05/17-He is here in follow-up evaluation for ulcerations to bilateral first and second and left third toe. He did not pick up the medihoney and therefore has not been changing his dressing. He admits to inconsistent where of compression stockings, admits to pain to his bilateral lower extremities with unilateral edema (right greater than left). The ulcerations to multiple toes are more macerated and larger in size than last week. He is wearing compression therapy today. He admits to  an overall feeling of weakness and fatigue and has been encouraged to contact his PCP for evaluation; he has a known history of anemia and intraabdominal lymphadenopathy. We will return to silvercel and he will follow up next week 09/19/17-He is here in follow-up evaluation for ulcerations to bilateral first and second toe.he was unable to make last week's appointment. The culture that was obtained on 5/30 grew clindamycin sensitive MRSA; he was initiated on 6/4 but did not start it until 6/7. There is significant improvement in all wounds, healing to left second and third toe. He also admits to having no alcohol or smoking for the last 6 days. We will continue with same treatment plan I will extend the clindamycin for an additional 10 days and he will follow-up next week. 09/26/17- He is here in follow evaluation for multiple ulcerations to multiple toes bilaterally. He continues to be non-compliant with dressing, compression therapy. He states he is taking the antibiotics as prescribed, although his recall is inconsistent. There is noted deterioration in all ulcers. We will continue same treatment plan, antibiotic therapy and he will follow up next week. 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 This information is obtained from the medical record, as he cannot articulate specifics to his diagnosis or treatment plan. 10/17/17-He is here in follow-up evaluation for multiple ulcerations to multiple toes bilaterally. There is chronic waxing and waning improvement/deterioration. On today's exam they appear improved, dry without maceration. He continues to apply a variety of topical agents, stating that he has switched between the blue product and silver product. In my opinion, this will be a chronic waxing and waning giving his  intermittent compliance. He will follow-up in 2 weeks. As it relates to the biopsy taken from his buttock, he has not followed up with that and has not started radiation therapy. 10/31/17-He is here for evaluation for multiple ulcerations to multiple toes bilaterally. Continues with waxing and waning improvement versus deterioration. They are stable on today's exam without significant maceration. 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 We will continue with every 2 week follow-up appointments 11/14/17- He is seen in follow-up evaluation for multiple ulcerations to multiple bilateral toes. He is scheduled for excision of basal cell carcinoma to his right buttock on Monday 8/12. He continues to have chronic waxing and waning of his ulcers; today is a stable day with minimal drainage and left ear is better than right toes. I will recount to infectious disease regarding chronic suppressive therapy. He admits to  being more compliant with compression stockings, although he is not wearing any today. He has been encouraged to continue with dressing changes as ordered and compression therapy daily. He will be seen in 2 weeks 11/28/17-He is seen in follow up evaluation for multiple ulcerations to multiple toes bilaterally. He had an overnight admission at [facility] (8/11-8/12) for symptomatic anemia, received two units prbc. His surgical date has been moved to 9/94 excision of basal cell carcinoma to the right buttock. He has been noncompliant in wearing compression stockings, applying topical treatment as ordered; he has been applying a "cream", Hydrofera Blue, silvercel. He is currently on no antibiotic therapy; we will send for re-referral to ID for suppressive therapy given his multiple co-morbidities, poor surgical candidate, unwilling to have toe amputation and general non-adherence. 12/19/17 on evaluation today patient actually appears to be doing very well in regard  to his toes at least compared to last time I saw him. He again has been coming to our office for quite a bit of time. 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 He states is hurting a little bit but doing well in general. I'm see were not taking care of this area dermatology is. He does have his appointment with infectious disease upcoming which they re-consult for suppressive therapy that's next Monday. CHUE, BERKOVICH ([id number]) 01/02/18 on evaluation today patient actually appears to be doing rather well in regard to his toe ulcers although he tells me he has been put in an ointment on this which I am concerned may be causing some additional moisture buildup which we really do not want. He supposed be using silver cell which is mainly thing I want him to be utilizing. Fortunately there does not appear to be any evidence of worsening infection which is good news. He has had surgery on his gluteal region where he had an excision of a cancerous area fortunately that seems to be doing well although he is having some discomfort. 02/06/18 on evaluation today patient actually appears to be doing well at all locations except for his second toe use bilaterally where the distal tip is still wrong open. He did state that he can't stand for dry skin to buildup on his toes subsequently as a question and further it sounds as if he actually pills this all which is likely keeping the toe is wrong and open. Also think that's what happened in the bottom of his foot although that appears to have healed at this point. There does not appear to be any evidence of infection. 02/20/18 on evaluation today patient appears to be doing better in regard to his foot ulcers. Everything has shown signs of improvement which is excellent news. Overall I'm very pleased with how things appear currently. The patient states  he's not picking her point at any skin and that he wears shoes at all times when he is ambulating at home. Both are things that I've advocated and recommended for him in the past. 03/13/18 on evaluation today patient actually appears to be doing very well in regard to the bilateral toes in the overall appearance of the wound bed locations. Fortunately there does not appear to be any signs of infection at this time. He states that he is very pleased with how things seem to be progressing to be honest as am I. 03/27/1899 evaluation today patient appears to be doing  well in regard to his toe ulcers. He just has a few areas remaining and these appear to be showing signs of improvement as well which is great news. Overall I have been impressed with the progress of the past several weeks. Electronic Signature(s) Signed: 03/27/2018 5:48:37 PM By: Worthy Keeler PA-C Entered By: Worthy Keeler on 03/27/2018 10:48:26 Dusing, [name] ([id number]) -------------------------------------------------------------------------------- Physical Exam Details Patient Name: Wiedemann, Kelechi E. Date of Service: 03/27/2018 8:30 AM Medical Record Number: [id number] Patient Account Number: [id number] Date of Birth/Sex: 1951-01-01 (67 y.o. M) Treating RN: Primary Care Provider: Lamonte Sakai Other Clinician: Referring Provider: Lamonte Sakai Treating Provider/Extender: Melburn Hake, HOYT Weeks in Treatment: 80 Constitutional Well-nourished and well-hydrated in no acute distress. Respiratory normal breathing without difficulty. clear to auscultation bilaterally. Cardiovascular regular rate and rhythm with normal S1, S2. Psychiatric this patient is able to make decisions and demonstrates good insight into disease process. Alert and Oriented x 3. pleasant and cooperative. Notes Patient's wound bed currently shows evidence of good granulation at this time. This is excellent news. Overall I think that he seems to be improving  although this has obviously been a very slow process. Electronic Signature(s) Signed: 03/27/2018 5:48:37 PM By: Worthy Keeler PA-C Entered By: Worthy Keeler on 03/27/2018 10:49:04 Caine, [name] ([id number]) -------------------------------------------------------------------------------- Physician Orders Details Patient Name: Galluzzo, Jionni E. Date of Service: 03/27/2018 8:30 AM Medical Record Number: [id number] Patient Account Number: [id number] Date of Birth/Sex: Apr 05, 1951 (67 y.o. M) Treating RN: Montey Hora Primary Care Provider: Lamonte Sakai Other Clinician: Referring Provider: Lamonte Sakai Treating Provider/Extender: Melburn Hake, HOYT Weeks in Treatment: 21 Verbal / Phone Orders: No Diagnosis Coding ICD-10 Coding Code Description L97.521 Non-pressure chronic ulcer of other part of left foot limited to breakdown of skin L97.511 Non-pressure chronic ulcer of other part of right foot limited to breakdown of skin E11.621 Type 2 diabetes mellitus with foot ulcer I87.323 Chronic venous hypertension (idiopathic) with inflammation of bilateral lower extremity I89.0 Lymphedema, not elsewhere classified F17.218 Nicotine dependence, cigarettes, 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 abuse 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, right ankle and foot Wound Cleansing Wound #14 Left Toe Second o Clean wound with Normal Saline. Wound #17 Right,Distal,Plantar Metatarsal head second o Clean wound with Normal Saline. Wound #16 Right,Plantar Toe Great o Clean wound with Normal Saline. Wound #2 Right Toe Second o Clean wound with Normal Saline. Anesthetic (add to Medication List) Wound #14 Left Toe Second o Topical Lidocaine 4% cream applied to wound bed prior to debridement (In Clinic Only). Wound #17 Right,Distal,Plantar Metatarsal head second o Topical Lidocaine 4% cream applied to wound bed prior to debridement (In  Clinic Only). Wound #16 Right,Plantar Toe Great o Topical Lidocaine 4% cream applied to wound bed prior to debridement (In Clinic Only). Wound #2 Right Toe Second o Topical Lidocaine 4% cream applied to wound bed prior to debridement (In Clinic Only). Primary Wound Dressing Wound #14 Left Toe Second o Silver Alginate - secure with tape Wound #17 Right,Distal,Plantar Metatarsal head second Mcneese, Ayad E. ([id number]) o Silver Alginate - secure with tape o Silver Alginate - secure with tape Wound #16 Right,Plantar Toe Great o Silver Alginate - secure with tape Wound #2 Right Toe Second o Silver Alginate - secure with tape Dressing Change Frequency Wound #14 Left Toe Second o Change dressing every day. Wound #17 Right,Distal,Plantar Metatarsal head second o Change dressing every day. o Change dressing every day. Wound #16 Right,Plantar Toe Great o Change  dressing every day. Wound #2 Right Toe Second o Change dressing every day. Follow-up Appointments Wound #14 Left Toe Second o Return Appointment in 2 weeks. Wound #17 Right,Distal,Plantar Metatarsal head second o Return Appointment in 2 weeks. o Return Appointment in 2 weeks. Wound #16 Right,Plantar Toe Great o Return Appointment in 2 weeks. Wound #2 Right Toe Second o Return Appointment in 2 weeks. Edema Control Wound #14 Left Toe Second o Patient to wear own compression stockings Wound #17 Right,Distal,Plantar Metatarsal head second o Patient to wear own compression stockings Wound #16 Right,Plantar Toe Great o Patient to wear own compression stockings Wound #2 Right Toe Second o Patient to wear own compression stockings Additional Orders / Instructions Wound #14 Left Toe Second o Stop Smoking o Increase protein intake. Wound #17 Right,Distal,Plantar Metatarsal head second Fore, Jakai E. ([id number]) o Stop Smoking o Increase protein intake. Wound #16 Right,Plantar  Toe Great o Stop Smoking o Increase protein intake. Wound #2 Right Toe Second o Stop Smoking o Increase protein intake. Electronic Signature(s) Signed: 03/27/2018 5:48:37 PM By: Worthy Keeler PA-C Signed: 03/28/2018 5:12:41 PM By: Montey Hora Entered By: Montey Hora on 03/27/2018 09:03:59 Bursch, [name] ([id number]) -------------------------------------------------------------------------------- Problem List Details Patient Name: Sainsbury, Daimian E. Date of Service: 03/27/2018 8:30 AM Medical Record Number: [id number] Patient Account Number: [id number] Date of Birth/Sex: 04/17/1950 (67 y.o. M) Treating RN: Primary Care Provider: Lamonte Sakai Other Clinician: Referring Provider: Lamonte Sakai Treating Provider/Extender: Melburn Hake, HOYT Weeks in Treatment: 67 Active Problems ICD-10 Evaluated Encounter Code Description Active Date Today Diagnosis L97.521 Non-pressure chronic ulcer of other part of left foot limited to 09/21/2016 No Yes breakdown of skin L97.511 Non-pressure chronic ulcer of other part of right foot limited to 09/21/2016 No Yes breakdown of skin E11.621 Type 2 diabetes mellitus with foot ulcer 09/21/2016 No Yes I87.323 Chronic venous hypertension (idiopathic) with inflammation of 09/21/2016 No Yes bilateral lower extremity I89.0 Lymphedema, not elsewhere classified 09/21/2016 No Yes F17.218 Nicotine dependence, cigarettes, with other nicotine-induced 09/21/2016 No Yes disorders F10.19 Alcohol abuse with unspecified alcohol-induced disorder 09/21/2016 No Yes M86.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, right ankle and foot 02/21/2017 No Yes Inactive Problems Resolved Problems Mahmood, EPIMENIO SCHETTER ([id number]) Electronic Signature(s) Signed: 03/27/2018 5:48:37 PM By: Worthy Keeler PA-C Entered By: Worthy Keeler on 03/27/2018 08:28:55 Paradiso, [name] ([id number]) -------------------------------------------------------------------------------- Progress Note  Details Patient Name: Faulstich, Ladainian E. Date of Service: 03/27/2018 8:30 AM Medical Record Number: [id number] Patient Account Number: [id number] Date of Birth/Sex: [birth year] (66 y.o. M) Treating RN: Primary Care Provider: Lamonte Sakai Other Clinician: Referring Provider: Lamonte Sakai Treating Provider/Extender: Melburn Hake, HOYT Weeks in Treatment: 47 Subjective Chief Complaint Information obtained from Patient Patient presents for treatment of an open diabetic ulcer to both feet History of Present Illness (HPI) The following HPI elements were documented for the patient's wound: Location: bilateral feet ulceration on the toes Quality: Patient reports experiencing a dull pain to affected area(s). Severity: Patient states wound are getting better Duration: Patient has had the wound for > 3 months prior to seeking treatment at the wound center Timing: Pain in wound is constant (hurts all the time) Context: The wound would happen gradually Modifying Factors: Other treatment(s) tried include:treatment for lymphedema and is seen by the podiatrist Dr. Caryl Comes Associated Signs and Symptoms: Patient reports having increase swelling. 67 year old patient here to see as for bilateral feet ulceration to on his left first and second toe and 2 on his right first and second toe, which she's had for  about 4 months. 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 Donella Stade. The patient is also undergoing workup by medical oncology for a intra-abdominal lymphadenopathy. 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 He is also status post appendectomy, inguinal lymph node biopsy, rectal biopsy and rectal examination under anesthesia. he currently smokes cigarettes about half  packet a day. In March of this year he was seen by Dr. Hortencia Pilar, for evaluation of bilateral varicose veins and besides wearing compression stockings he had recommended laser ablation of the right and left great saphenous veins to eleviate the symptoms and complications of severe superficial venous reflux disease. He also recommended lymphedema pumps for better control of his lymphedema. The patient recently has had on 08/23/2016, right greater saphenous vein ablation with the laser energy Earlier lower extremity venous reflux examination done on 05/08/2016 showed no DVT or SVT both lower legs but incompetence of bilateral great saphenous veins was present. A lower arterial study was also done and there was no significant right lower and left lower extremity problems based on a normal toe brachial index bilaterally and the ABI was 1.21 the left and 1.23 on the right. His post ablation venous duplex examination showed successful ablation of the right GS vein with thrombus formation 2 below the right saphenofemoral junction. The deep system was patent without evidence of thrombosis and this was done on 08/30/2016. 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 Addendum: regarding his x-rays done today and x-ray of the left foot -- IMPRESSION: No objective evidence of osteomyelitis. There are soft tissue changes which may reflect cellulitis. X-ray of the right foot -- IMPRESSION:Findings compatible with cellulitis of the toes. No objective evidence of osteomyelitis is observed. 10/01/16 on evaluation today patient's wounds appeared to be doing some better. I did review the x-rays as well which showed no evidence of osteomyelitis although there was evidence on x-ray of cellulitis. He fortunately is not having any discomfort Hinners, Delrick E. ([id number]) although he continues to have some  swelling. He does not remember being on any antibiotics recently. 10/15/16 on evaluation today patient's wounds overall appear to be doing better although he does have a new location noted on the left foot. Fortunately he is not having significant pain. It almost has the appearance that something is rubbing on the end of his toes but he wears the open toe shoes and according to what he is telling me never wears anything that would rub on his foot. There is no evidence of infection and specifically no evidence of a fungal infection 10/22/16 On evaluation today patient's wounds appeared to be doing better compared to last week in regard to his bilateral lower extremities. Fortunately I happy with how things are progressing although he still has ulcers I feel like that he is improving and appropriate manner. 11/12/16 on evaluation today patient appears to be doing well in regard to his bilateral feet and the respective wounds. We have been using surrounding her dressings along with an antifungal cream which seems to be doing very well. He has no bilateral dysfunction noticed that the rituals are weight loss at this point. He also has no nausea  or vomiting a note purulent discharge. He did see Vein and vascular today and he tells me that they told him he could have surgery for his venous stasis but they did not feel like it was worth it in his words. Fortunately patient's wounds do appear to be getting sneakily better. 11/26/2016 -- he says he is going to have some surgery during this week at [facility] for possibly a colon resection. 12/31/2016 -- the patient has been noncompliant with his smoking and I'm not sure whether he is also started drinking again. He continues to be very nonchalant about his care 01/14/2017 -- the patient's HandP has been reviewed well and I understand he is being compliant with trying to give up smoking and his local dressing changes. He does not have any surgical options of 4  to him by his vascular surgeons.he was last seen in early August by Dr. Hortencia Pilar who recommended compression stockings,and possibly lymph pumps in 2-3 months after doing a review ultrasound. 01/28/2017 - the patient did not have any fresh complaints but on examination I noted a large lacerated wound on the plantar aspect of his right fourth toe which had a lot of necrotic debris and it probes down to bone. 02/07/2017 -- x-ray of the right foot -- IMPRESSION: Soft tissue swelling about the first through fourth toes consistent with cellulitis. New destructive change in the tuft of the distal phalanx of the great toe is consistent with osteomyelitis. 02/14/2017 -- the patient's MRI is pending this coming Monday and he still continues to smoke. We have again gone over off loading of his wounds in great detail and he says he's been compliant. 02/21/2017 -- MR of the right foot -- IMPRESSION: 1. Soft tissue ulcer at the tip of the first, second and third toe knows. Cortical irregularity and bone marrow edema in the first distal phalanx most concerning for osteomyelitis. Mild marrow edema in the second and third distal phalanx without definite cortical destruction which may reflect early osteomyelitis versus reactive marrow edema. 2. Soft tissue edema surrounding the first phalanx most consistent with cellulitis. the patient was also recently evaluated by his medical oncologist Dr. 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 She is treating him with weekly Procrit. She is also keeping intra-abdominal lymphadenopathy and right lower lobe lung nodule under observation. 04/04/2017 -- he was seen by Dr. Adrian Prows on 03/25/2017 -- after review he empirically put him on ciprofloxacin and doxycycline as they have good bone penetration and good bioavailability and it will cover the usual pathogens and diabetic foot osteomyelitis.  He will check inflammatory markers and plan a 56-63 week old records. C-reactive protein was 0.3 and the ESR was 72 04/18/17 on evaluation today patient appears to be doing about the same in regard to his lower extremity wounds bilaterally. He has continued to use the antifungal cream which does seem to be beneficial. Nonetheless the ulcers do seem to in some areas be epithelial eyes over and in other areas are still open. He is having no significant discomfort. 04/25/17-he is here in follow-up evaluation for multiple ulcerations to multiple toes bilaterally. He states he did see Dr. Ola Spurr again last week and continues antibiotic therapy. He is voicing no complaints or concerns, will continue with current treatment plan will possibility of adding compression therapy next week after an additional week of treatment/lotions to BLE prescribed by Dr Ola Spurr 05/02/17 he is here  in follow up for for multiple ulcers to multiple toes bilaterally. we will stop using antifungal cream and will continue with silvercel and follow up next week Reise, Kameran E. ([id number]) 05/09/17-he is here in follow-up for multiple ulcerations to multiple toes bilaterally. There is improvement in appearance. He has not completely stopped using antifungal cream, but admits he has not using it between the toes. He has an appointment with Dr. Ola Spurr on 2/11, continues on doxycycline and Cipro. It has been 5 weeks of antibiotic therapy, we will order plain film xray to evaluate for osteomyelitis next week, prior to follow up with ID. Will continue with silvercel and follow up next week 05/16/17-he is here in follow-up evaluation for multiple ulcerations to multiple toes bilaterally and new wound to the right posterior heel. There is essentially no change in appearance, deteriorating measurements; he has a history of waxing and waning measurements. He admits that he continues to apply moisturizer/cream/ointment to his toes  despite weekly reminders to only apply silvercel to his toes. He states that he thinks the surgical shoe contributed to the superficial ulcer to his posterior heel, he is unable to articulate if this was an area of dry cracked skin as he has a similar area to the left heel. He now is wearing open toed slippers. He has an appointment with Dr. Ola Spurr on 2/11. We have ordered x-rays for her bilateral feet; he was advised to obtain the x-rays today or tomorrow. He will follow-up next week 05/23/17-he is here in follow-up evaluation for multiple ulcerations to multiple toes bilaterally and the right posterior heel. There is improvement in maceration. He has been compliant and not applying any moisturizing agent to his toes. He has been using Lac-Hydrin for his lower extremities with improvement. He did not go to his appointment on Monday with Dr. Ola Spurr secondary to financial concerns. X-rays for her bilateral feet showed: LEFT FOOT with slight erosion of the tuft of the distal phalanges of the left first and second toe suspicious for osteomyelitis, RIGHT FOOT with 1.erosion of the tufts of the distal phalanges of the right first second and possibly third toes consistent with osteomyelitis, 2 no definitive abnormality of the calcaneus is seen on the images obtained, 3. Plantar calcaneal degenerative spur. We briefly discussed hyperbaric adjunctive therapy for treatment of chronic refractory osteomyelitis. I do not find an a1c in EMR, will contact PCP for record, or order if needed. He has been encouraged to contact Dr Ola Spurr office regarding the follow-up appointment, encouraged him to inquire about payment plan. We will continue with same treatment plan and follow-up next week. He states he is still taking antibiotics and has "a lot" left. He states he has been taking them as directed, 2 pills twice daily. According to Dr. Blane Ohara office notes he was originally started on 12/17 for 4  weeks and extended on 1/14 for an additional 4 weeks. He should be done with his antibiotic therapy, he was advised to bring his bottles and to his next appointment, we will contact pharmacy. 05/30/17-he is here in follow-up evaluation for multiple ulcerations to multiple toes bilaterally and the right posterior heel. He is accompanied by his brother-in-law. Wounds are stable. He has yet to make up with Dr. Ola Spurr. We contacted his PCP, with no record of recent A1c we will draw an A1c. His brother-in-law states that he was taken off all of his diabetic medication secondary to kidney function. 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 The antibiotics were counted and have approximately 2 weeks left, although they should be complete. The pill organizer reveals missing days. We discussed the need for consistent medications, to have optimal benefit of medication. He has a cousin that lives with him and he will ask her to check his organizer daily. He has been advised to follow up with Dr Ola Spurr, and will go by the office today. He has been advised to quit smoking. 06/06/17-he is here in follow up evaluation. He has had to make an appointment with Dr. Ola Spurr. He did have blood work obtained, a1c 5. He continues to take antibiotic therapy. Significant improvement in bilateral lower extremity edema with compression therapy. Essentially no change in ulcerations to toes. He states he is "going to try something different" and "let me know next week" if it works; he would not provide any additional information and was encouraged to follow our orders. We will follow up next week 06/13/17-he is here in follow-up evaluation. He has an appointment with Dr. Ola Spurr tomorrow morning. He states he purchased an ointment from [facility] and applied to his toes for 3 days, he does not remember the name of the  ointment. There is improvement to his wounds, minimal maceration. He continues to take antibiotic therapy, this should have been completed last month. His brother-in-law who regularly accompanies his appointments was asked to take the bottles to the appointment tomorrow with Dr. Ola Spurr so he is aware. We will continue with 3 layer compression, and order OPEN TOE compression 20-30mmHg; we will apply compression stockings next week. He continues to smoke, smoked "2 cigarettes" last week 06/20/17 on evaluation today patient did receive his compression stockings which he has with him today for both lower extremities. With that being said he tells me at this point in time that he is very happy to have these he really is not a big fan of the compression wraps that we have been utilizing although they have been of great benefit for him. Nonetheless at this point he does want to switch to the compression stockings. In my opinion as long as he is continuing with compression I'm okay with the stockings or the wraps. 06/27/17-he is here in follow-up evaluation for multiple ulcerations to his bilateral toes. There is some improvement in appearance. He is compliant in wearing his compression stockings with significant improvement in lower extremity edema. He saw Dr Ola Spurr on 3/8, per his notes they would redraw ESR and CRP; plan to continue antibiotic therapy if these remain elevated. I do not see an ESR or CRP level in Epic. The patient continues to take antibiotics. 07/11/17-He is here in follow-up evaluation for multiple ulcerations to multiple toes bilaterally. He presents with complete epithelialization to the right third toe; there has been no deterioration. He continues on antibiotic therapy. He will follow-up next week 07/18/17-He is here in follow-up evaluation for multiple ulcerations to multiple toes bilaterally. He continues to make improvement. He continues on antibiotic therapy. He states he  has been using something additional to our orders, he does [name], Shiquan E. ([id number]) not elaborate but states he will bring it in next week. 07/25/17-He is here in follow up evaluation for multiple ulcerations to bilateral toes. He is stable. He has completed antibiotic therapy. He admits to "filing" his toes after showers each evening, this is what he was referencing last week; he does not filing for the wounds. We will switch to  Hydrofera Blue and monitor for any improvement, he will follow-up next week 08/01/17-He is here in follow-up evaluation. He admits to "picking" at his toes after cleansing yesterday, leading to new areas of tissue loss on the bilateral second toe. There is improvement noted to the bilateral great toe. We will dress toes today and hope that they maintain until Monday where he will come in for a nurse visit. He has been advised, multiple times with expressed verbalization, to change the dressings to silvercel if the dressings get wet prior to [facility] appointment. 08/08/17-He is here in follow-up evaluation for bilateral first and second toe ulcerations. There is significant improvement to all ulcerations since last visit. We will switch to [facility] to all wounds and he will continue with nurse visits on a Monday/Thursday schedule and follow-up with me in 2 weeks. He continues to smoke, 1-3 cigarettes per day, and has been encouraged to not smoke until his next follow-up in 2 weeks. 08/15/17 on evaluation today patient's ulcers on his toes actually appear to be doing fairly well the right to ulcers may be a little bit more moist compared to the left I'm not really sure exactly why as the openings appear to be very small and I'm not seeing any evidence of anything significant as far as you lightest or otherwise. Nonetheless this may just be a small setback he's been doing very well with the [facility] Dressing 08/22/17-He is here in follow-up evaluation for ulcerations  to his bilateral first and second toes, there is small/scant amount of drainage noted on today's dressing. He continues with Hydrofera Blue. He continues to smoke, 1 cigarette a day. Voices no complaint or concerns, compliant and compression therapy today. He'll follow-up next week 08/29/17-He is here in follow-up evaluation for ulcerations to his bilateral first and second toes, with a new wound to the left third toe. He states he cut himself while cutting the toenail. He presents today with more maceration and increased measurements; waxing and waning measurements and moisture has been an ongoing issue. He continues to smoke a proximally 1 cigarette per day, but states he has not smoked since Sunday. We will initiate medihoney daily and evaluate next week. He presents today without compression stockings 09/05/17-He is here in follow-up evaluation for ulcerations to bilateral first and second and left third toe. He did not pick up the medihoney and therefore has not been changing his dressing. He admits to inconsistent where of compression stockings, admits to pain to his bilateral lower extremities with unilateral edema (right greater than left). The ulcerations to multiple toes are more macerated and larger in size than last week. He is wearing compression therapy today. He admits to an overall feeling of weakness and fatigue and has been encouraged to contact his PCP for evaluation; he has a known history of anemia and intraabdominal lymphadenopathy. We will return to silvercel and he will follow up next week 09/19/17-He is here in follow-up evaluation for ulcerations to bilateral first and second toe.he was unable to make last week's appointment. The culture that was obtained on 5/30 grew clindamycin sensitive MRSA; he was initiated on 6/4 but did not start it until 6/7. There is significant improvement in all wounds, healing to left second and third toe. He also admits to having no alcohol or  smoking for the last 6 days. We will continue with same treatment plan I will extend the clindamycin for an additional 10 days and he will follow-up next week. 09/26/17- He  is here in follow evaluation for multiple ulcerations to multiple toes bilaterally. He continues to be non-compliant with dressing, compression therapy. He states he is taking the antibiotics as prescribed, although his recall is inconsistent. There is noted deterioration in all ulcers. We will continue same treatment plan, antibiotic therapy and he will follow up next week. 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 This information is obtained from the medical record, as he cannot articulate specifics to his diagnosis or treatment plan. 10/17/17-He is here in follow-up evaluation for multiple ulcerations to multiple toes bilaterally. There is chronic waxing and waning improvement/deterioration. On today's exam they appear improved, dry without maceration. He continues to apply a variety of topical agents, stating that he has switched between the blue product and silver product. In my opinion, this will be a chronic waxing and waning giving his intermittent compliance. He will follow-up in 2 weeks. As it relates to the biopsy taken from his buttock, he has not followed up with that and has not started radiation therapy. 10/31/17-He is here for evaluation for multiple ulcerations to multiple toes bilaterally. Continues with waxing and waning improvement versus deterioration. They are stable on today's exam without significant maceration. 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 We will continue with every 2 week follow-up appointments 11/14/17- He is seen in follow-up evaluation for multiple ulcerations to multiple bilateral toes.  He is scheduled for excision of basal cell carcinoma to his right buttock on Monday 8/12. He continues to have chronic waxing and waning of his ulcers; today is a stable day with minimal drainage and left ear is better than right toes. I will recount to infectious disease regarding chronic suppressive therapy. He admits to being more compliant with compression stockings, although he is not wearing any today. He has been encouraged to continue with dressing changes as ordered and compression therapy daily. He will be seen in 2 weeks 11/28/17-He is seen in follow up evaluation for multiple ulcerations to multiple toes bilaterally. He had an overnight admission at [facility] (8/11-8/12) for symptomatic anemia, received two units prbc. His surgical date has been moved to 9/94 excision of basal cell carcinoma to the right buttock. He has been noncompliant in wearing compression stockings, applying topical treatment as ordered; he has been applying a "cream", Hydrofera Blue, silvercel. He is currently on no antibiotic Ostroff, Pilar E. ([id number]) therapy; we will send for re-referral to ID for suppressive therapy given his multiple co-morbidities, poor surgical candidate, unwilling to have toe amputation and general non-adherence. 12/19/17 on evaluation today patient actually appears to be doing very well in regard to his toes at least compared to last time I saw him. He again has been coming to our office for quite a bit of time. 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 He states is hurting a little bit but doing well in general. I'm see were not taking care of this area dermatology is. He does have his appointment with infectious disease upcoming which they re-consult for suppressive therapy that's next Monday. 01/02/18 on evaluation today patient actually appears to be doing rather well in  regard to his toe ulcers although he tells me he has  been put in an ointment on this which I am concerned may be causing some additional moisture buildup which we really do not want. He supposed be using silver cell which is mainly thing I want him to be utilizing. Fortunately there does not appear to be any evidence of worsening infection which is good news. He has had surgery on his gluteal region where he had an excision of a cancerous area fortunately that seems to be doing well although he is having some discomfort. 02/06/18 on evaluation today patient actually appears to be doing well at all locations except for his second toe use bilaterally where the distal tip is still wrong open. He did state that he can't stand for dry skin to buildup on his toes subsequently as a question and further it sounds as if he actually pills this all which is likely keeping the toe is wrong and open. Also think that's what happened in the bottom of his foot although that appears to have healed at this point. There does not appear to be any evidence of infection. 02/20/18 on evaluation today patient appears to be doing better in regard to his foot ulcers. Everything has shown signs of improvement which is excellent news. Overall I'm very pleased with how things appear currently. The patient states he's not picking her point at any skin and that he wears shoes at all times when he is ambulating at home. Both are things that I've advocated and recommended for him in the past. 03/13/18 on evaluation today patient actually appears to be doing very well in regard to the bilateral toes in the overall appearance of the wound bed locations. Fortunately there does not appear to be any signs of infection at this time. He states that he is very pleased with how things seem to be progressing to be honest as am I. 03/27/1899 evaluation today patient appears to be doing well in regard to his toe ulcers. He just has a few areas  remaining and these appear to be showing signs of improvement as well which is great news. Overall I have been impressed with the progress of the past several weeks. Patient History Information obtained from Patient. Social History Current every day smoker, Marital Status - Widowed, Alcohol Use - Daily - quit drinking about a week ago, Drug Use - No History, Caffeine Use - Moderate. 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 Respiratory The patient has no complaints or symptoms. Cardiovascular The patient has no complaints or symptoms. Psychiatric The patient has no complaints or symptoms. EURA, RADABAUGH ([id number]) Objective Constitutional Well-nourished and well-hydrated in no acute distress. Vitals Time Taken: 8:30 AM, Height: 69 in, Weight: 168 lbs, BMI: 24.8, Temperature: 98.2 F, Pulse: 74 bpm, Respiratory Rate: 16 breaths/min, Blood Pressure: 162/82 mmHg. General Notes: Inquired as to why patient's blood pressure is a little high - MD has increased his Hydralazine 50 mg. two times daily. Patient also had been drinking [place]. Dew this morning. Respiratory normal breathing without difficulty. clear to auscultation bilaterally. Cardiovascular regular rate and rhythm with normal S1, S2. Psychiatric this patient is able to make decisions and demonstrates good insight into disease process. Alert and Oriented x 3. pleasant and cooperative. General Notes: Patient's wound bed currently shows evidence of good granulation at this time. This is excellent news. Overall I think that he seems to  be improving although this has obviously been a very slow process. Integumentary (Hair, Skin) Wound #14 status is Open. Original cause of wound was Gradually Appeared. The wound is located on the Left Toe Second. The  wound measures 0.7cm length x 0.7cm width x 0.1cm depth; 0.385cm^2 area and 0.038cm^3 volume. There is Fat Layer (Subcutaneous Tissue) Exposed exposed. There is no tunneling or undermining noted. There is a medium amount of serous drainage noted. The wound margin is flat and intact. There is large (67-100%) pink granulation within the wound bed. There is a small (1-33%) amount of necrotic tissue within the wound bed including Adherent Slough. The periwound skin appearance did not exhibit: Callus, Crepitus, Excoriation, Induration, Rash, Scarring, Dry/Scaly, Maceration, Atrophie Blanche, Cyanosis, Ecchymosis, Hemosiderin Staining, Mottled, Pallor, Rubor, Erythema. Wound #16 status is Open. Original cause of wound was Not Known. The wound is located on the [employer]. The wound measures 0.7cm length x 0.8cm width x 0.1cm depth; 0.44cm^2 area and 0.044cm^3 volume. There is Fat Layer (Subcutaneous Tissue) Exposed exposed. There is no tunneling or undermining noted. There is a medium amount of serous drainage noted. The wound margin is flat and intact. There is large (67-100%) pink granulation within the wound bed. There is a small (1-33%) amount of necrotic tissue within the wound bed including Adherent Slough. The periwound skin appearance did not exhibit: Callus, Crepitus, Excoriation, Induration, Rash, Scarring, Dry/Scaly, Maceration, Atrophie Blanche, Cyanosis, Ecchymosis, Hemosiderin Staining, Mottled, Pallor, Rubor, Erythema. Periwound temperature was noted as No Abnormality. Wound #17 status is Open. Original cause of wound was Trauma. The wound is located on the Right,Distal,Plantar Metatarsal head second. The wound measures 1.2cm length x 0.6cm width x 0.1cm depth; 0.565cm^2 area and 0.057cm^3 volume. There is Fat Layer (Subcutaneous Tissue) Exposed exposed. There is no tunneling or undermining noted. There is a medium amount of serous drainage noted. The wound margin is flat and  intact. There is small (1-33%) pink granulation within the wound bed. There is a large (67-100%) amount of necrotic tissue within the wound bed including Adherent Slough. The periwound skin appearance did not exhibit: Callus, Crepitus, Excoriation, Induration, Rash, Scarring, Dry/Scaly, Maceration, Atrophie Blanche, Cyanosis, Ecchymosis, Hemosiderin Staining, Mottled, Pallor, Rubor, Erythema. Periwound temperature was noted as No Abnormality. Wound #2 status is Open. Original cause of wound was Gradually Appeared. The wound is located on the Right Toe Second. The wound measures 0.2cm length x 0.9cm width x 0.1cm depth; 0.141cm^2 area and 0.014cm^3 volume. There is Fat Layer (Subcutaneous Tissue) Exposed exposed. There is no tunneling or undermining noted. There is a medium amount of serous drainage noted. The wound margin is flat and intact. There is medium (34-66%) pink granulation within the wound bed. There is a medium (34-66%) amount of necrotic tissue within the wound bed including Adherent Slough. The periwound skin Chewning, Leaman E. ([id number]) appearance did not exhibit: Callus, Crepitus, Excoriation, Induration, Rash, Scarring, Dry/Scaly, Maceration, Atrophie Blanche, Cyanosis, Ecchymosis, Hemosiderin Staining, Mottled, Pallor, Rubor, Erythema. Periwound temperature was noted as No Abnormality. 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 Wound #17 Right,Distal,Plantar Metatarsal head second: Clean wound with Normal Saline. Wound #16 Right,Plantar Toe Great: Clean wound with Normal Saline. Wound #2 Right Toe Second: Clean wound with Normal Saline. Anesthetic (add to Medication List): Wound #14 Left Toe Second: Topical Lidocaine 4% cream applied to wound bed prior to debridement (In Clinic Only). Wound #17 Right,Distal,Plantar Metatarsal head second: Topical Lidocaine 4% cream applied to wound bed prior to debridement (In Clinic Only). Wound #16 Right,Plantar Toe Great: Topical Lidocaine 4% cream applied to wound bed prior to debridement (In Clinic Only). Wound #2 Right Toe Second: Topical Lidocaine 4% cream applied to wound bed prior to debridement (In Clinic Only). Primary Wound Dressing: Wound #14 Left Toe Second: Silver Alginate - secure with tape Wound #17 Right,Distal,Plantar Metatarsal head second: Silver Alginate - secure with tape Silver Alginate - secure with tape Wound #16 Right,Plantar Toe Great: Silver Alginate - secure with tape Wound #2 Right Toe Second: Silver Alginate - secure with tape Dressing Change Frequency: Wound #14 Left Toe Second: Demers, Abubakr E. ([id number]) Change dressing every day. Wound #17 Right,Distal,Plantar Metatarsal head second: Change dressing every day. Change dressing every day. Wound #16 Right,Plantar Toe Great: Change dressing every day. Wound #2 Right Toe Second: Change dressing every day. Follow-up Appointments: Wound #14 Left Toe Second: Return Appointment in 2 weeks. Wound #17 Right,Distal,Plantar Metatarsal head second: Return Appointment in 2 weeks. Return Appointment in 2 weeks. Wound #16 Right,Plantar Toe Great: Return Appointment in 2 weeks. Wound #2 Right Toe Second: Return Appointment in 2 weeks. Edema Control: Wound #14 Left Toe Second: Patient to wear own compression stockings Wound #17 Right,Distal,Plantar  Metatarsal head second: Patient to wear own compression stockings Wound #16 Right,Plantar Toe Great: Patient to wear own compression stockings Wound #2 Right Toe Second: Patient to wear own compression stockings Additional Orders / Instructions: Wound #14 Left Toe Second: Stop Smoking Increase protein intake. Wound #17 Right,Distal,Plantar Metatarsal head second: Stop Smoking Increase protein intake. Wound #16 Right,Plantar Toe Great: Stop Smoking Increase protein intake. Wound #2 Right Toe Second: Stop Smoking Increase protein intake. No sharp debridement was required today of any wound sites. I think the biggest thing is gonna be controlling the moisture which hopefully we can do with the current dressing regimen and long enough in order to get everything healed. He does have the supplies now they took some time to get to him apparently. We will subsequently see him back for reevaluation. Please see above for specific wound care orders. We will see patient for re-evaluation in 2 week(s) here in the clinic. If anything worsens or changes patient will contact our office for additional recommendations. Electronic Signature(s) Signed: 03/27/2018 5:48:37 PM By: Worthy Keeler PA-C Entered By: Worthy Keeler on 03/27/2018 10:49:25 Tamburri, [name] ([id number]) -------------------------------------------------------------------------------- ROS/PFSH Details Patient Name: Clopper, Chrisopher E. Date of Service: 03/27/2018 8:30 AM Medical Record Number: [id number] Patient Account Number: [id number] Date of Birth/Sex: 1950/05/19 (67 y.o. M) Treating RN: Primary Care Provider: Lamonte Sakai Other Clinician: Referring Provider: Lamonte Sakai Treating Provider/Extender: Melburn Hake, HOYT Weeks in Treatment: 54 Information Obtained From Patient Wound History Do you currently have one or more open woundso Yes How many open wounds do you currently haveo 6 Approximately how long have you had your  woundso 3 months How have you been treating your wound(s) until nowo ointment and bandage Has your wound(s) ever healed and then re-openedo No Have you had any lab work done in the past montho No  Have you tested positive for an antibiotic resistant organism (MRSA, VRE)o No Have you tested positive for osteomyelitis (bone infection)o No Have you had any tests for circulation on your legso Yes Who ordered the testo PCP Where was the test doneo [facility] Constitutional Symptoms (General Health) Complaints and Symptoms: Negative for: Fever; Chills Eyes Medical History: Negative for: Cataracts; Glaucoma; Optic Neuritis Ear/Nose/Mouth/Throat Medical History: Negative for: Chronic sinus problems/congestion; Middle ear problems Hematologic/Lymphatic Medical History: Positive for: Anemia; Lymphedema Negative for: Hemophilia; Human Immunodeficiency Virus; Sickle Cell Disease Respiratory Complaints and Symptoms: No Complaints or Symptoms Medical History: Negative for: Aspiration; Asthma; Chronic Obstructive Pulmonary Disease (COPD); Pneumothorax; Sleep Apnea; Tuberculosis Cardiovascular Complaints and Symptoms: No Complaints or Symptoms Rougeau, Nikolaus E. ([id number]) Medical History: Positive for: Congestive Heart Failure; Hypertension; Peripheral Venous Disease Negative for: Angina; Arrhythmia; Coronary Artery Disease; Deep Vein Thrombosis; Hypotension; Myocardial Infarction; Peripheral Arterial Disease; Phlebitis; Vasculitis Gastrointestinal Medical History: Negative for: Cirrhosis ; Colitis; Crohnos; Hepatitis A; Hepatitis B; Hepatitis C Endocrine Medical History: Positive for: Type II Diabetes Treated with: Oral agents Blood sugar tested every day: Yes Tested : QD Genitourinary Medical History: Negative for: End Stage Renal Disease Immunological Medical History: Negative for: Lupus Erythematosus; Raynaudos; Scleroderma Integumentary (Skin) Medical History: Negative for: History  of Burn; History of pressure wounds Musculoskeletal Medical History: Negative for: Gout; Rheumatoid Arthritis; Osteoarthritis; Osteomyelitis Neurologic Medical History: Positive for: Neuropathy Negative for: Dementia; Quadriplegia; Paraplegia; Seizure Disorder Oncologic Medical History: Past Medical History Notes: 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 ([id number]) Immunization Notes: up to date Implantable Devices Family and Social History Current every day smoker; Marital Status - Widowed; Alcohol Use: Daily - quit drinking about a week ago; Drug Use: No History; Caffeine Use: Moderate; Financial Concerns: No; Food, Clothing or Shelter Needs: No; Support System Lacking: No; Transportation Concerns: No; Advanced Directives: No; Patient does not want information on Advanced Directives Physician Affirmation I have reviewed and agree with the above information. Electronic Signature(s) Signed: 03/27/2018 5:48:37 PM By: Worthy Keeler PA-C Entered By: Worthy Keeler on 03/27/2018 10:48:43 Moorhouse, [name] ([id number]) -------------------------------------------------------------------------------- SuperBill Details Patient Name: Wickliffe, Kaci E. Date of Service: 03/27/2018 Medical Record Number: [id number] Patient Account Number: [id number] Date of Birth/Sex: 1950/04/11 (67 y.o. 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 of skin E11.621 Type 2 diabetes mellitus  with foot ulcer I87.323 Chronic venous hypertension (idiopathic) with inflammation of bilateral lower extremity I89.0 Lymphedema, not elsewhere classified F17.218 Nicotine dependence, cigarettes, 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 abuse 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, right ankle and foot Facility Procedures CPT4 Code: 24401027 Description: 99214 - WOUND CARE VISIT-LEV 4 EST PT Modifier: Quantity: 1 Physician Procedures CPT4 Code Description: [id number] - WC PHYS LEVEL 4 - EST PT ICD-10 Diagnosis Description L97.521 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

## 2018-04-10 ENCOUNTER — Encounter: Payer: Medicare HMO | Attending: Physician Assistant | Admitting: Physician Assistant

## 2018-04-10 DIAGNOSIS — L97521 Non-pressure chronic ulcer of other part of left foot limited to breakdown of skin: Secondary | ICD-10-CM | POA: Insufficient documentation

## 2018-04-10 DIAGNOSIS — I130 Hypertensive heart and chronic kidney disease with heart failure and stage 1 through stage 4 chronic kidney disease, or unspecified chronic kidney disease: Secondary | ICD-10-CM | POA: Diagnosis not present

## 2018-04-10 DIAGNOSIS — E1122 Type 2 diabetes mellitus with diabetic chronic kidney disease: Secondary | ICD-10-CM | POA: Insufficient documentation

## 2018-04-10 DIAGNOSIS — Z923 Personal history of irradiation: Secondary | ICD-10-CM | POA: Diagnosis not present

## 2018-04-10 DIAGNOSIS — I890 Lymphedema, not elsewhere classified: Secondary | ICD-10-CM | POA: Insufficient documentation

## 2018-04-10 DIAGNOSIS — E1140 Type 2 diabetes mellitus with diabetic neuropathy, unspecified: Secondary | ICD-10-CM | POA: Diagnosis not present

## 2018-04-10 DIAGNOSIS — K7460 Unspecified cirrhosis of liver: Secondary | ICD-10-CM | POA: Insufficient documentation

## 2018-04-10 DIAGNOSIS — I509 Heart failure, unspecified: Secondary | ICD-10-CM | POA: Insufficient documentation

## 2018-04-10 DIAGNOSIS — E1169 Type 2 diabetes mellitus with other specified complication: Secondary | ICD-10-CM | POA: Diagnosis not present

## 2018-04-10 DIAGNOSIS — L97511 Non-pressure chronic ulcer of other part of right foot limited to breakdown of skin: Secondary | ICD-10-CM | POA: Diagnosis not present

## 2018-04-10 DIAGNOSIS — N189 Chronic kidney disease, unspecified: Secondary | ICD-10-CM | POA: Insufficient documentation

## 2018-04-10 DIAGNOSIS — F17218 Nicotine dependence, cigarettes, with other nicotine-induced disorders: Secondary | ICD-10-CM | POA: Insufficient documentation

## 2018-04-10 DIAGNOSIS — D631 Anemia in chronic kidney disease: Secondary | ICD-10-CM | POA: Insufficient documentation

## 2018-04-10 DIAGNOSIS — I87323 Chronic venous hypertension (idiopathic) with inflammation of bilateral lower extremity: Secondary | ICD-10-CM | POA: Insufficient documentation

## 2018-04-10 DIAGNOSIS — E11621 Type 2 diabetes mellitus with foot ulcer: Secondary | ICD-10-CM | POA: Insufficient documentation

## 2018-04-10 DIAGNOSIS — Z85828 Personal history of other malignant neoplasm of skin: Secondary | ICD-10-CM | POA: Insufficient documentation

## 2018-04-11 NOTE — Progress Notes (Signed)
YASSIN, SCALES ([id number]) Visit Report for 04/10/2018 Arrival Information Details Patient Name: [name], [name] Date of Service: 04/10/2018 8:00 AM Medical Record Number: [id number] Patient Account Number: [id number] Date of Birth/Sex: 02-02-1951 (68 y.o. M) Treating RN: Harold Barban Primary Care Caldwell Kronenberger: Lamonte Sakai Other Clinician: Referring Collin Rengel: Lamonte Sakai Treating Juliya Magill/Extender: Melburn Hake, HOYT Weeks in Treatment: 38 Visit Information History Since Last Visit Added or deleted any medications: No Patient Arrived: Ambulatory Any new allergies or adverse reactions: No Arrival Time: 08:12 Had a fall or experienced change in No Accompanied By: brother in activities of daily living that may affect law risk of falls: Transfer Assistance: None Signs or symptoms of abuse/neglect since last visito No Patient Identification Verified: Yes Hospitalized since last visit: No Secondary Verification Process Completed: Yes Implantable device outside of the clinic excluding No Patient Requires Transmission-Based No cellular tissue based products placed in the center Precautions: since last visit: Patient Has Alerts: Yes Has Dressing in Place as Prescribed: Yes Patient Alerts: DMII Pain Present Now: No Electronic Signature(s) Signed: 04/10/2018 8:36:46 AM By: Lorine Bears RCP, RRT, CHT Entered By: Lorine Bears on 04/10/2018 08:12:43 Demeter, [name] ([id number]) -------------------------------------------------------------------------------- Clinic Level of Care Assessment Details Patient Name: [name], [name] Date of Service: 04/10/2018 8:00 AM Medical Record Number: [id number] Patient Account Number: [id number] Date of Birth/Sex: [birth year] (68 y.o. M) Treating RN: Montey Hora Primary Care Selwyn Reason: Lamonte Sakai Other Clinician: Referring Danne Scardina: Lamonte Sakai Treating Shakirah Kirkey/Extender: Melburn Hake, HOYT Weeks in Treatment: 34 Clinic Level  of Care Assessment Items TOOL 4 Quantity Score []  - Use when only an EandM is performed on FOLLOW-UP visit 0 ASSESSMENTS - Nursing Assessment / Reassessment X - Reassessment of Co-morbidities (includes updates in patient status) 1 10 X- 1 5 Reassessment of Adherence to Treatment Plan ASSESSMENTS - Wound and Skin Assessment / Reassessment []  - Simple Wound Assessment / Reassessment - one wound 0 X- 4 5 Complex Wound Assessment / Reassessment - multiple wounds []  - 0 Dermatologic / Skin Assessment (not related to wound area) ASSESSMENTS - Focused Assessment []  - Circumferential Edema Measurements - multi extremities 0 []  - 0 Nutritional Assessment / Counseling / Intervention X- 1 5 Lower Extremity Assessment (monofilament, tuning fork, pulses) []  - 0 Peripheral Arterial Disease Assessment (using hand held doppler) ASSESSMENTS - Ostomy and/or Continence Assessment and Care []  - Incontinence Assessment and Management 0 []  - 0 Ostomy Care Assessment and Management (repouching, etc.) PROCESS - Coordination of Care X - Simple Patient / Family Education for ongoing care 1 15 []  - 0 Complex (extensive) Patient / Family Education for ongoing care X- 1 10 Staff obtains Programmer, systems, Records, Test Results / Process Orders []  - 0 Staff telephones HHA, Nursing Homes / Clarify orders / etc []  - 0 Routine Transfer to another Facility (non-emergent condition) []  - 0 Routine Hospital Admission (non-emergent condition) []  - 0 New Admissions / Biomedical engineer / Ordering NPWT, Apligraf, etc. []  - 0 Emergency Hospital Admission (emergent condition) X- 1 10 Simple Discharge Coordination Montanaro, Juancarlos E. ([id number]) []  - 0 Complex (extensive) Discharge Coordination PROCESS - Special Needs []  - Pediatric / Minor Patient Management 0 []  - 0 Isolation Patient Management []  - 0 Hearing / Language / Visual special needs []  - 0 Assessment of Community assistance (transportation, D/C  planning, etc.) []  - 0 Additional assistance / Altered mentation []  - 0 Support Surface(s) Assessment (bed, cushion, seat, etc.) INTERVENTIONS - Wound Cleansing / Measurement []  - Simple Wound Cleansing -  one wound 0 X- 4 5 Complex Wound Cleansing - multiple wounds X- 1 5 Wound Imaging (photographs - any number of wounds) []  - 0 Wound Tracing (instead of photographs) []  - 0 Simple Wound Measurement - one wound X- 4 5 Complex Wound Measurement - multiple wounds INTERVENTIONS - Wound Dressings X - Small Wound Dressing one or multiple wounds 4 10 []  - 0 Medium Wound Dressing one or multiple wounds []  - 0 Large Wound Dressing one or multiple wounds []  - 0 Application of Medications - topical []  - 0 Application of Medications - injection INTERVENTIONS - Miscellaneous []  - External ear exam 0 []  - 0 Specimen Collection (cultures, biopsies, blood, body fluids, etc.) []  - 0 Specimen(s) / Culture(s) sent or taken to Lab for analysis []  - 0 Patient Transfer (multiple staff / Civil Service fast streamer / Similar devices) []  - 0 Simple Staple / Suture removal (25 or less) []  - 0 Complex Staple / Suture removal (26 or more) []  - 0 Hypo / Hyperglycemic Management (close monitor of Blood Glucose) []  - 0 Ankle / Brachial Index (ABI) - do not check if billed separately X- 1 5 Vital Signs [name], [name] ([id number]) Has the patient been seen at the hospital within the last three years: Yes Total Score: 165 Level Of Care: New/Established - Level 5 Electronic Signature(s) Signed: 04/10/2018 4:35:36 PM By: Montey Hora Entered By: Montey Hora on 04/10/2018 08:40:01 Kiddy, [name] ([id number]) -------------------------------------------------------------------------------- Encounter Discharge Information Details Patient Name: [name], [name] Date of Service: 04/10/2018 8:00 AM Medical Record Number: [id number] Patient Account Number: [id number] Date of Birth/Sex: 04/11/1950 (68 y.o. M) Treating  RN: Montey Hora Primary Care Armelia Penton: Lamonte Sakai Other Clinician: Referring Bonnie Overdorf: Lamonte Sakai Treating Zunaira Lamy/Extender: Melburn Hake, HOYT Weeks in Treatment: 27 Encounter Discharge Information Items Discharge Condition: Stable Ambulatory Status: Ambulatory Discharge Destination: Home Transportation: Private Auto Accompanied By: brother in law Schedule Follow-up Appointment: Yes Clinical Summary of Care: Electronic Signature(s) Signed: 04/10/2018 8:58:01 AM By: Montey Hora Entered By: Montey Hora on 04/10/2018 08:58:00 Sybert, [name] ([id number]) -------------------------------------------------------------------------------- Lower Extremity Assessment Details Patient Name: [name], [name] Date of Service: 04/10/2018 8:00 AM Medical Record Number: [id number] Patient Account Number: [id number] Date of Birth/Sex: Aug 13, 1950 (68 y.o. M) Treating RN: Montey Hora Primary Care Lekha Dancer: Lamonte Sakai Other Clinician: Referring Goldie Dimmer: Lamonte Sakai Treating Traven Davids/Extender: Melburn Hake, HOYT Weeks in Treatment: 47 Vascular Assessment Pulses: Dorsalis Pedis Palpable: [Left:Yes] [Right:Yes] Posterior Tibial Extremity colors, hair growth, and conditions: Extremity Color: [Left:Hyperpigmented] [Right:Hyperpigmented] Hair Growth on Extremity: [Left:No] [Right:No] Temperature of Extremity: [Left:Warm] [Right:Warm] Capillary Refill: [Left:< 3 seconds] [Right:< 3 seconds] Toe Nail Assessment Left: Right: Thick: Yes Yes Discolored: Yes Yes Deformed: No No Improper Length and Hygiene: No No Electronic Signature(s) Signed: 04/10/2018 4:35:36 PM By: Montey Hora Entered By: Montey Hora on 04/10/2018 08:24:22 Keinath, Zeek E. ([id number]) -------------------------------------------------------------------------------- Multi Wound Chart Details Patient Name: [name], [name] Date of Service: 04/10/2018 8:00 AM Medical Record Number: [id number] Patient Account Number:  [id number] Date of Birth/Sex: 06-Mar-1951 (68 y.o. M) Treating RN: Montey Hora Primary Care Montrey Buist: Lamonte Sakai Other Clinician: Referring Florina Glas: Lamonte Sakai Treating Mckensie Scotti/Extender: Melburn Hake, HOYT Weeks in Treatment: 80 Vital Signs Height(in): 69 Pulse(bpm): 24 Weight(lbs): 168 Blood Pressure(mmHg): 147/70 Body Mass Index(BMI): 25 Temperature(F): 97.9 Respiratory Rate 16 (breaths/min): Photos: [14:No Photos] [16:No Photos] [17:No Photos] Wound Location: [14:Left Toe Second] [16:Right Toe Great - Plantar] [17:Right Metatarsal head second - Plantar, Distal] Wounding Event: [14:Gradually Appeared] [16:Not Known] [17:Trauma] Primary 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] [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] [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] Comorbid History: [14:Anemia, Lymphedema, Congestive Heart Failure, Hypertension, Peripheral Venous Disease, Type II Diabetes, Neuropathy] [16:Anemia, Lymphedema, Congestive Heart Failure, Hypertension, Peripheral Venous Disease, Type II Diabetes,  Neuropathy] [17:Anemia, Lymphedema, Congestive Heart Failure, Hypertension, Peripheral Venous Disease, Type II Diabetes, Neuropathy] Date Acquired: [14:09/26/2017] [16:10/29/2017] [17:02/03/2018] Weeks of Treatment: [14:28] [16:23] [17:9] Wound Status: [14:Open] [16:Open] [17:Open] Pending Amputation on [14:No] [16:No] [17:No] Presentation: Measurements L x W x D [14:1.5x1.1x0.1] [16:0.5x0.6x0.1] [17:1.3x0.2x0.1] (cm) Area (cm) : [14:1.296] [16:0.236] [17:0.204] Volume (cm) : [14:0.13] [16:0.024] [17:0.02] % Reduction in Area: [14:45.00%] [16:85.30%] [17:42.20%] % Reduction in Volume: [14:44.90%] [16:85.00%] [17:42.90%] Classification: [14:Grade 1] [16:Grade 1] [17:Grade 1] Exudate Amount: [14:Medium] [16:Medium] [17:Medium] Exudate Type: [14:Serous] [16:Serous] [17:Serous] Exudate Color: [14:amber] [16:amber] [17:amber] Wound Margin:  [14:Flat and Intact] [16:Flat and Intact] [17:Flat and Intact] Granulation Amount: [14:Large (67-100%)] [16:Large (67-100%)] [17:Small (1-33%)] Granulation Quality: [14:Pink] [16:Pink] [17:Pink] Necrotic Amount: [14:Small (1-33%)] [16:Small (1-33%)] [17:Large (67-100%)] Exposed Structures: [14:Fat Layer (Subcutaneous Tissue) Exposed: Yes Fascia: No Tendon: No Muscle: No Joint: No Bone: No] [16:Fat Layer (Subcutaneous Tissue) Exposed: Yes Fascia: No Tendon: No Muscle: No Joint: No Bone: No] [17:Fat Layer (Subcutaneous Tissue) Exposed: Yes  Fascia: No Tendon: No Muscle: No Joint: No Bone: No] Epithelialization: Medium (34-66%) None None Periwound Skin Texture: Excoriation: No Excoriation: No Excoriation: No Induration: No Induration: No Induration: No Callus: No Callus: No Callus: No Crepitus: No Crepitus: No Crepitus: No Rash: No Rash: No Rash: No Scarring: No Scarring: No Scarring: No Periwound Skin Moisture: Maceration: No Maceration: No Maceration: No Dry/Scaly: No Dry/Scaly: No Dry/Scaly: No Periwound Skin Color: Atrophie Blanche: No Atrophie Blanche: No Atrophie Blanche: No Cyanosis: No Cyanosis: No Cyanosis: No Ecchymosis: No Ecchymosis: No Ecchymosis: No Erythema: No Erythema: No Erythema: No Hemosiderin Staining: No Hemosiderin Staining: No Hemosiderin Staining: No Mottled: No Mottled: No Mottled: No Pallor: No Pallor: No Pallor: No Rubor: No Rubor: No Rubor: No Temperature: N/A No Abnormality No Abnormality Tenderness on Palpation: No No No Wound Preparation: Ulcer Cleansing: Ulcer Cleansing: Ulcer Cleansing: Rinsed/Irrigated with Saline Rinsed/Irrigated with Saline Rinsed/Irrigated with Saline Topical Anesthetic Applied: Topical Anesthetic Applied: Topical Anesthetic Applied: None None Other: lidocaine 4% Wound Number: 2 N/A N/A Photos: No Photos N/A N/A Wound Location: Right Toe Second N/A N/A Wounding Event: Gradually Appeared N/A  N/A Primary Etiology: Diabetic Wound/Ulcer of the N/A N/A Lower Extremity Comorbid History: Anemia, Lymphedema, N/A N/A Congestive Heart Failure, Hypertension, Peripheral Venous Disease, Type II Diabetes, Neuropathy Date Acquired: 06/11/2016 N/A N/A Weeks of Treatment: 80 N/A N/A Wound Status: Open N/A N/A Pending Amputation on Yes N/A N/A Presentation: Measurements L x W x D 0.1x0.1x0.1 N/A N/A (cm) Area (cm) : 0.008 N/A N/A Volume (cm) : 0.001 N/A N/A % Reduction in Area: 99.70% N/A N/A % Reduction in Volume: 99.70% N/A N/A Classification: Grade 2 N/A N/A Exudate Amount: Medium N/A N/A Exudate Type: Serous N/A N/A Exudate Color: amber N/A N/A Wound Margin: Flat and Intact N/A N/A Granulation Amount: Medium (34-66%) N/A N/A Granulation Quality: Pink N/A N/A Necrotic Amount: Medium (34-66%) N/A N/A Exposed Structures: Fat Layer (Subcutaneous N/A N/A Tissue) Exposed: Yes Fascia: No Massing, Skippy E. ([id number]) Tendon: No Muscle: No Joint: No Bone: No Epithelialization: None N/A N/A Periwound Skin Texture: Excoriation: No N/A N/A Induration: No Callus: No Crepitus: No Rash: No Scarring: No Periwound Skin Moisture: Maceration: No N/A N/A Dry/Scaly: No Periwound Skin Color: Atrophie Blanche: No N/A N/A Cyanosis: No Ecchymosis:  No Erythema: No Hemosiderin Staining: No Mottled: No Pallor: No Rubor: No Temperature: No Abnormality N/A N/A Tenderness on Palpation: No N/A N/A Wound Preparation: Ulcer Cleansing: N/A N/A Rinsed/Irrigated with Saline Topical Anesthetic Applied: None Treatment Notes Electronic Signature(s) Signed: 04/10/2018 4:35:36 PM By: Montey Hora Entered By: Montey Hora on 04/10/2018 08:34:03 Villarin, [name] ([id number]) -------------------------------------------------------------------------------- [place] Details Patient Name: [name], [name] Date of Service: 04/10/2018 8:00 AM Medical Record Number:  [id number] Patient Account Number: [id number] Date of Birth/Sex: 1951-02-03 (68 y.o. M) Treating RN: Montey Hora Primary Care Sakiya Stepka: Lamonte Sakai Other Clinician: Referring Iktan Aikman: Lamonte Sakai Treating Lavaughn Bisig/Extender: Melburn Hake, HOYT Weeks in Treatment: 66 Active Inactive Abuse / Safety / Falls / Self Care Management Nursing Diagnoses: Potential for falls Goals: Patient will remain injury free related to falls Date Initiated: 09/21/2016 Target Resolution Date: 09/14/2017 Goal Status: Active Interventions: Assess fall risk on admission and as needed Notes: Nutrition Nursing Diagnoses: Potential for alteratiion in Nutrition/Potential for imbalanced nutrition Goals: Patient/caregiver agrees to and verbalizes understanding of need to use nutritional supplements and/or vitamins as prescribed Date Initiated: 09/21/2016 Target Resolution Date: 09/14/2017 Goal Status: Active Interventions: Assess patient nutrition upon admission and as needed per policy Notes: Orientation to the Wound Care Program Nursing Diagnoses: Knowledge deficit related to the wound healing center program Goals: Patient/caregiver will verbalize understanding of the [facility] Date Initiated: 09/21/2016 Target Resolution Date: 06/15/2017 Goal Status: Active Interventions: Provide education on orientation to the wound center Grunow, Duke E. ([id number]) Notes: Wound/Skin Impairment Nursing Diagnoses: Knowledge deficit related to smoking impact on wound healing Goals: Ulcer/skin breakdown will have a volume reduction of 30% by week 4 Date Initiated: 09/21/2016 Target Resolution Date: 08/17/2017 Goal Status: Active Ulcer/skin breakdown will have a volume reduction of 50% by week 8 Date Initiated: 09/21/2016 Target Resolution Date: 08/17/2017 Goal Status: Active Ulcer/skin breakdown will have a volume reduction of 80% by week 12 Date Initiated: 09/21/2016 Target Resolution Date:  09/14/2017 Goal Status: Active Ulcer/skin breakdown will heal within 14 weeks Date Initiated: 09/21/2016 Target Resolution Date: 08/17/2017 Goal Status: Active Interventions: Assess patient/caregiver ability to obtain necessary supplies Assess patient/caregiver ability to perform ulcer/skin care regimen upon admission and as needed Assess ulceration(s) every visit Notes: Electronic Signature(s) Signed: 04/10/2018 4:35:36 PM By: Montey Hora Entered By: Montey Hora on 04/10/2018 08:24:29 [place]ville, Lanard E. ([id number]) -------------------------------------------------------------------------------- Pain Assessment Details Patient Name: [name], [name] Date of Service: 04/10/2018 8:00 AM Medical Record Number: [id number] Patient Account Number: [id number] Date of Birth/Sex: 1950-11-20 (68 y.o. M) Treating RN: Harold Barban Primary Care Carmaleta Youngers: Lamonte Sakai Other Clinician: Referring Jocelyn Lowery: Lamonte Sakai Treating Talton Delpriore/Extender: Melburn Hake, HOYT Weeks in Treatment: 42 Active Problems Location of Pain Severity and Description of Pain Patient Has Paino No Site Locations Pain Management and Medication Current Pain Management: Electronic Signature(s) Signed: 04/10/2018 8:36:46 AM By: Lorine Bears RCP, RRT, CHT Signed: 04/10/2018 3:53:38 PM By: Harold Barban Entered By: Lorine Bears on 04/10/2018 08:12:51 Goudeau, [name] ([id number]) -------------------------------------------------------------------------------- Patient/Caregiver Education Details Patient Name: [name], [name] Date of Service: 04/10/2018 8:00 AM Medical Record Number: [id number] Patient Account Number: [id number] Date of Birth/Gender: November 09, 1950 (68 y.o. M) Treating RN: Montey Hora Primary Care Physician: Lamonte Sakai Other Clinician: Referring Physician: Lamonte Sakai Treating Physician/Extender: Sharalyn Ink in Treatment: 47 Education Assessment Education Provided  To: Patient and Caregiver Education Topics Provided Wound/Skin Impairment: Handouts: Other: keep wounds covered Methods: Demonstration, Explain/Verbal Responses: State content correctly Electronic Signature(s) Signed: 04/10/2018 4:35:36 PM  By: Montey Hora Entered By: Montey Hora on 04/10/2018 08:58:20 Saephanh, Cheick Johnette Abraham ([id number]) -------------------------------------------------------------------------------- Wound Assessment Details Patient Name: [name], [name] Date of Service: 04/10/2018 8:00 AM Medical Record Number: [id number] Patient Account Number: [id number] Date of Birth/Sex: 07/07/1950 (68 y.o. M) Treating RN: Montey Hora Primary Care Shulem Mader: Lamonte Sakai Other Clinician: Referring Mumin Denomme: Lamonte Sakai Treating Teandra Harlan/Extender: Melburn Hake, HOYT Weeks in Treatment: 80 Wound Status Wound Number: 14 Primary Diabetic Wound/Ulcer of the Lower Extremity Etiology: Wound Location: Left Toe Second Wound Open Wounding Event: Gradually Appeared Status: Date Acquired: 09/26/2017 Comorbid Anemia, Lymphedema, Congestive Heart Weeks Of Treatment: 28 History: Failure, Hypertension, Peripheral Venous Clustered Wound: No Disease, Type II Diabetes, Neuropathy Photos Photo Uploaded By: Montey Hora on 04/10/2018 11:42:34 Wound Measurements Length: (cm) 1.5 Width: (cm) 1.1 Depth: (cm) 0.1 Area: (cm) 1.296 Volume: (cm) 0.13 % Reduction in Area: 45% % Reduction in Volume: 44.9% Epithelialization: Medium (34-66%) Tunneling: No Undermining: No Wound Description Classification: Grade 1 Wound Margin: Flat and Intact Exudate Amount: Medium Exudate Type: Serous Exudate Color: amber Foul Odor After Cleansing: No Slough/Fibrino Yes Wound Bed Granulation Amount: Large (67-100%) Exposed Structure Granulation Quality: Pink Fascia Exposed: No Necrotic Amount: Small (1-33%) Fat Layer (Subcutaneous Tissue) Exposed: Yes Necrotic Quality: Adherent Slough Tendon Exposed:  No Muscle Exposed: No Joint Exposed: No Bone Exposed: No Periwound Skin Texture Woolstenhulme, Adonnis E. ([id number]) Texture Color No Abnormalities Noted: No No Abnormalities Noted: No Callus: No Atrophie Blanche: No Crepitus: No Cyanosis: No Excoriation: No Ecchymosis: No Induration: No Erythema: No Rash: No Hemosiderin Staining: No Scarring: No Mottled: No Pallor: No Moisture Rubor: No No Abnormalities Noted: No Dry / Scaly: No Maceration: No Wound Preparation Ulcer Cleansing: Rinsed/Irrigated with Saline Topical Anesthetic Applied: None Treatment Notes Wound #14 (Left Toe Second) Notes silvercel and tape Electronic Signature(s) Signed: 04/10/2018 4:35:36 PM By: Montey Hora Entered By: Montey Hora on 04/10/2018 08:23:10 Scarbro, Benino EMarland Kitchen ([id number]) -------------------------------------------------------------------------------- Wound Assessment Details Patient Name: [name], [name] Date of Service: 04/10/2018 8:00 AM Medical Record Number: [id number] Patient Account Number: [id number] Date of Birth/Sex: May 11, 1950 (68 y.o. M) Treating RN: Montey Hora Primary Care Lakeva Hollon: Lamonte Sakai Other Clinician: Referring Alyse Kathan: Lamonte Sakai Treating Kyrstyn Greear/Extender: Melburn Hake, HOYT Weeks in Treatment: 80 Wound Status Wound Number: 16 Primary Diabetic Wound/Ulcer of the Lower Extremity Etiology: Wound Location: Right Toe Great - Plantar Wound Open Wounding Event: Not Known Status: Date Acquired: 10/29/2017 Comorbid Anemia, Lymphedema, Congestive Heart Weeks Of Treatment: 23 History: Failure, Hypertension, Peripheral Venous Clustered Wound: No Disease, Type II Diabetes, Neuropathy Photos Photo Uploaded By: Montey Hora on 04/10/2018 11:42:35 Wound Measurements Length: (cm) 0.5 Width: (cm) 0.6 Depth: (cm) 0.1 Area: (cm) 0.236 Volume: (cm) 0.024 % Reduction in Area: 85.3% % Reduction in Volume: 85% Epithelialization: None Tunneling: No Undermining:  No Wound Description Classification: Grade 1 Wound Margin: Flat and Intact Exudate Amount: Medium Exudate Type: Serous Exudate Color: amber Foul Odor After Cleansing: No Slough/Fibrino Yes Wound Bed Granulation Amount: Large (67-100%) Exposed Structure Granulation Quality: Pink Fascia Exposed: No Necrotic Amount: Small (1-33%) Fat Layer (Subcutaneous Tissue) Exposed: Yes Necrotic Quality: Adherent Slough Tendon Exposed: No Muscle Exposed: No Joint Exposed: No Bone Exposed: No Periwound Skin Texture Charrette, Shykeem E. ([id number]) Texture Color No Abnormalities Noted: No No Abnormalities Noted: No Callus: No Atrophie Blanche: No Crepitus: No Cyanosis: No Excoriation: No Ecchymosis: No Induration: No Erythema: No Rash: No Hemosiderin Staining: No Scarring: No Mottled: No Pallor: No Moisture Rubor: No No Abnormalities Noted: No Dry / Scaly: No Temperature /  Pain Maceration: No Temperature: No Abnormality Wound Preparation Ulcer Cleansing: Rinsed/Irrigated with Saline Topical Anesthetic Applied: None Treatment Notes Wound #16 (Right, Plantar Toe Great) Notes silvercel and tape Electronic Signature(s) Signed: 04/10/2018 4:35:36 PM By: Montey Hora Entered By: Montey Hora on 04/10/2018 08:23:24 Adan, Wes E. ([id number]) -------------------------------------------------------------------------------- Wound Assessment Details Patient Name: [name], [name] Date of Service: 04/10/2018 8:00 AM Medical Record Number: [id number] Patient Account Number: [id number] Date of Birth/Sex: [birth year] (68 y.o. M) Treating RN: Montey Hora Primary Care Jessyca Sloan: Lamonte Sakai Other Clinician: Referring Faith Patricelli: Lamonte Sakai Treating Jaquana Geiger/Extender: Melburn Hake, HOYT Weeks in Treatment: 80 Wound Status Wound Number: 17 Primary Diabetic Wound/Ulcer of the Lower Extremity Etiology: Wound Location: Right Metatarsal head second - Plantar, Distal Wound Open Status: Wounding  Event: Trauma Comorbid Anemia, Lymphedema, Congestive Heart Date Acquired: 02/03/2018 History: Failure, Hypertension, Peripheral Venous Weeks Of Treatment: 9 Disease, Type II Diabetes, Neuropathy Clustered Wound: No Photos Photo Uploaded By: Montey Hora on 04/10/2018 11:43:02 Wound Measurements Length: (cm) 1.3 Width: (cm) 0.2 Depth: (cm) 0.1 Area: (cm) 0.204 Volume: (cm) 0.02 % Reduction in Area: 42.2% % Reduction in Volume: 42.9% Epithelialization: None Tunneling: No Undermining: No Wound Description Classification: Grade 1 Wound Margin: Flat and Intact Exudate Amount: Medium Exudate Type: Serous Exudate Color: amber Foul Odor After Cleansing: No Slough/Fibrino Yes Wound Bed Granulation Amount: Small (1-33%) Exposed Structure Granulation Quality: Pink Fascia Exposed: No Necrotic Amount: Large (67-100%) Fat Layer (Subcutaneous Tissue) Exposed: Yes Necrotic Quality: Adherent Slough Tendon Exposed: No Muscle Exposed: No Joint Exposed: No Bone Exposed: No Periwound Skin Texture Eckhart, Catcher E. ([id number]) Texture Color No Abnormalities Noted: No No Abnormalities Noted: No Callus: No Atrophie Blanche: No Crepitus: No Cyanosis: No Excoriation: No Ecchymosis: No Induration: No Erythema: No Rash: No Hemosiderin Staining: No Scarring: No Mottled: No Pallor: No Moisture Rubor: No No Abnormalities Noted: No Dry / Scaly: No Temperature / Pain Maceration: No Temperature: No Abnormality Wound Preparation Ulcer Cleansing: Rinsed/Irrigated with Saline Topical Anesthetic Applied: Other: lidocaine 4%, Treatment Notes Wound #17 (Right, Distal, Plantar Metatarsal head second) Notes silvercel and tape Electronic Signature(s) Signed: 04/10/2018 4:35:36 PM By: Montey Hora Entered By: Montey Hora on 04/10/2018 08:23:36 Gordner, [name] ([id number]) -------------------------------------------------------------------------------- Wound Assessment  Details Patient Name: [name], [name] Date of Service: 04/10/2018 8:00 AM Medical Record Number: [id number] Patient Account Number: [id number] Date of Birth/Sex: 1951-02-02 (68 y.o. M) Treating RN: Montey Hora Primary Care Aniken Monestime: Lamonte Sakai Other Clinician: Referring Jazir Newey: Lamonte Sakai Treating Mckenzye Cutright/Extender: Melburn Hake, HOYT Weeks in Treatment: 67 Wound Status Wound Number: 2 Primary Diabetic Wound/Ulcer of the Lower Extremity Etiology: Wound Location: Right Toe Second Wound Open Wounding Event: Gradually Appeared Status: Date Acquired: 06/11/2016 Comorbid Anemia, Lymphedema, Congestive Heart Weeks Of Treatment: 80 History: Failure, Hypertension, Peripheral Venous Clustered Wound: No Disease, Type II Diabetes, Neuropathy Pending Amputation On Presentation Photos Photo Uploaded By: Montey Hora on 04/10/2018 11:43:03 Wound Measurements Length: (cm) 0.1 Width: (cm) 0.1 Depth: (cm) 0.1 Area: (cm) 0.008 Volume: (cm) 0.001 % Reduction in Area: 99.7% % Reduction in Volume: 99.7% Epithelialization: None Tunneling: No Undermining: No Wound Description Classification: Grade 2 Wound Margin: Flat and Intact Exudate Amount: Medium Exudate Type: Serous Exudate Color: amber Foul Odor After Cleansing: No Slough/Fibrino Yes Wound Bed Granulation Amount: Medium (34-66%) Exposed Structure Granulation Quality: Pink Fascia Exposed: No Necrotic Amount: Medium (34-66%) Fat Layer (Subcutaneous Tissue) Exposed: Yes Necrotic Quality: Adherent Slough Tendon Exposed: No Muscle Exposed: No Joint Exposed: No Bone Exposed: No Periwound Skin Texture Lorenzetti, Alva E. ([id number])  Texture Color No Abnormalities Noted: No No Abnormalities Noted: No Callus: No Atrophie Blanche: No Crepitus: No Cyanosis: No Excoriation: No Ecchymosis: No Induration: No Erythema: No Rash: No Hemosiderin Staining: No Scarring: No Mottled: No Pallor: No Moisture Rubor: No No  Abnormalities Noted: No Dry / Scaly: No Temperature / Pain Maceration: No Temperature: No Abnormality Wound Preparation Ulcer Cleansing: Rinsed/Irrigated with Saline Topical Anesthetic Applied: None Treatment Notes Wound #2 (Right Toe Second) Notes silvercel and tape Electronic Signature(s) Signed: 04/10/2018 4:35:36 PM By: Montey Hora Entered By: Montey Hora on 04/10/2018 08:23:52 Hurlbut, Guilford E. ([id number]) -------------------------------------------------------------------------------- Vitals Details Patient Name: Strand, Dawud E. Date of Service: 04/10/2018 8:00 AM Medical Record Number: [id number] Patient Account Number: [id number] Date of Birth/Sex: [birth year] (68 y.o. M) Treating RN: Harold Barban Primary Care Kelsye Loomer: Lamonte Sakai Other Clinician: Referring Antwian Santaana: Lamonte Sakai Treating Zarya Lasseigne/Extender: Melburn Hake, HOYT Weeks in Treatment: 55 Vital Signs Time Taken: 08:12 Temperature (F): 97.9 Height (in): 69 Pulse (bpm): 69 Weight (lbs): 168 Respiratory Rate (breaths/min): 16 Body Mass Index (BMI): 24.8 Blood Pressure (mmHg): 147/70 Reference Range: 80 - 120 mg / dl Electronic Signature(s) Signed: 04/10/2018 8:36:46 AM By: Lorine Bears RCP, RRT, CHT Entered By: Lorine Bears on 04/10/2018 08:15:08

## 2018-04-11 NOTE — Progress Notes (Signed)
HILDRETH, ROBART ([id number]) Visit Report for 04/10/2018 Chief Complaint Document Details Patient Name: [name], [name] Date of Service: 04/10/2018 8:00 AM Medical Record Number: [id number] Patient Account Number: [id number] Date of Birth/Sex: 09-25-1950 (68 y.o. M) Treating RN: Harold Barban Primary Care Provider: Lamonte Sakai Other Clinician: Referring Provider: Lamonte Sakai Treating Provider/Extender: Melburn Hake, HOYT Weeks in Treatment: 75 Information Obtained from: Patient Chief Complaint Patient presents for treatment of an open diabetic ulcer to both feet Electronic Signature(s) Signed: 04/10/2018 11:59:27 PM By: Worthy Keeler PA-C Entered By: Worthy Keeler on 04/10/2018 08:29:32 Brasington, [name] ([id number]) -------------------------------------------------------------------------------- HPI Details Patient Name: [name], [name] Date of Service: 04/10/2018 8:00 AM Medical Record Number: [id number] Patient Account Number: [id number] Date of Birth/Sex: Jun 13, 1950 (68 y.o. M) Treating RN: Harold Barban Primary Care Provider: Lamonte Sakai Other Clinician: Referring Provider: Lamonte Sakai Treating Provider/Extender: Melburn Hake, HOYT Weeks in Treatment: 10 History of Present Illness HPI Description: 68 year old patient here to see as for bilateral feet ulceration to on his left first and second toe and 2 on his right first and second toe, which she's had for about 4 months. 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 Donella Stade. The patient is also undergoing workup by medical oncology for a intra-abdominal lymphadenopathy. 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 He is also status post appendectomy, inguinal lymph node biopsy, rectal biopsy  and rectal examination under anesthesia. he currently smokes cigarettes about half packet a day. In March of this year he was seen by Dr. Hortencia Pilar, for evaluation of bilateral varicose veins and besides wearing compression stockings he had recommended laser ablation of the right and left great saphenous veins to eleviate the symptoms and complications of severe superficial venous reflux disease. He also recommended lymphedema pumps for better control of his lymphedema. The patient recently has had on 08/23/2016, right greater saphenous vein ablation with the laser energy Earlier lower extremity venous reflux examination done on 05/08/2016 showed no DVT or SVT both lower legs but incompetence of bilateral great saphenous veins was present. A lower arterial study was also done and there was no significant right lower and left lower extremity problems based on a normal toe brachial index bilaterally and the ABI was 1.21 the left and 1.23 on the right. His post ablation venous duplex examination showed successful ablation of the right GS vein with thrombus formation 2 below the right saphenofemoral junction. The deep system was patent without evidence of thrombosis and this was done on 08/30/2016. 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 Addendum: regarding his x-rays done today and x-ray of the left foot -- IMPRESSION: No objective evidence of osteomyelitis. There are soft tissue changes which may reflect cellulitis. X-ray of the right foot -- IMPRESSION:Findings compatible with cellulitis of the toes. No objective evidence of osteomyelitis is observed. 10/01/16 on evaluation today patient's wounds appeared to be doing some better. I did review the x-rays as well which showed no evidence of osteomyelitis although there was evidence on x-ray of cellulitis. He fortunately is not  having any discomfort although he continues to have some swelling. He does not remember being on any antibiotics  recently. 10/15/16 on evaluation today patient's wounds overall appear to be doing better although he does have a new location noted on the left foot. Fortunately he is not having significant pain. It almost has the appearance that something is rubbing on the end of his toes but he wears the open toe shoes and according to what he is telling me never wears anything that would rub on his foot. There is no evidence of infection and specifically no evidence of a fungal infection 10/22/16 On evaluation today patient's wounds appeared to be doing better compared to last week in regard to his bilateral lower extremities. Fortunately I happy with how things are progressing although he still has ulcers I feel like that he is improving and appropriate manner. 11/12/16 on evaluation today patient appears to be doing well in regard to his bilateral feet and the respective wounds. We have been using surrounding her dressings along with an antifungal cream which seems to be doing very well. He has no bilateral dysfunction noticed that the rituals are weight loss at this point. He also has no nausea or vomiting a note purulent discharge. He did see Vein and vascular today and he tells me that they told him he could have surgery for his venous stasis but they did not feel like it was worth it in his words. Fortunately patient's wounds do appear to be getting sneakily better. CARDALE, DORER ([id number]) 11/26/2016 -- he says he is going to have some surgery during this week at [facility] for possibly a colon resection. 12/31/2016 -- the patient has been noncompliant with his smoking and I'm not sure whether he is also started drinking again. He continues to be very nonchalant about his care 01/14/2017 -- the patient's HandP has been reviewed well and I understand he is being compliant with trying to give  up smoking and his local dressing changes. He does not have any surgical options of 4 to him by his vascular surgeons.he was last seen in early August by Dr. Hortencia Pilar who recommended compression stockings,and possibly lymph pumps in 2-3 months after doing a review ultrasound. 01/28/2017 - the patient did not have any fresh complaints but on examination I noted a large lacerated wound on the plantar aspect of his right fourth toe which had a lot of necrotic debris and it probes down to bone. 02/07/2017 -- x-ray of the right foot -- IMPRESSION: Soft tissue swelling about the first through fourth toes consistent with cellulitis. New destructive change in the tuft of the distal phalanx of the great toe is consistent with osteomyelitis. 02/14/2017 -- the patient's MRI is pending this coming Monday and he still continues to smoke. We have again gone over off loading of his wounds in great detail and he says he's been compliant. 02/21/2017 -- MR of the right foot -- IMPRESSION: 1. Soft tissue ulcer at the tip of the first, second and third toe knows. Cortical irregularity and bone marrow edema in the first distal phalanx most concerning for osteomyelitis. Mild marrow edema in the second and third distal phalanx without definite cortical destruction which may reflect early osteomyelitis versus reactive marrow edema. 2. Soft tissue edema surrounding the first phalanx most consistent with cellulitis. the patient was also recently evaluated by his medical oncologist Dr. 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 She is treating him with weekly Procrit. She is also keeping intra-abdominal lymphadenopathy and right lower  lobe lung nodule under observation. 04/04/2017 -- he was seen by Dr. Adrian Prows on 03/25/2017 -- after review he empirically put him on ciprofloxacin and doxycycline as they have good bone penetration and good  bioavailability and it will cover the usual pathogens and diabetic foot osteomyelitis. He will check inflammatory markers and plan a 67-89 week old records. C-reactive protein was 0.3 and the ESR was 72 04/18/17 on evaluation today patient appears to be doing about the same in regard to his lower extremity wounds bilaterally. He has continued to use the antifungal cream which does seem to be beneficial. Nonetheless the ulcers do seem to in some areas be epithelial eyes over and in other areas are still open. He is having no significant discomfort. 04/25/17-he is here in follow-up evaluation for multiple ulcerations to multiple toes bilaterally. He states he did see Dr. Ola Spurr again last week and continues antibiotic therapy. He is voicing no complaints or concerns, will continue with current treatment plan will possibility of adding compression therapy next week after an additional week of treatment/lotions to BLE prescribed by Dr Ola Spurr 05/02/17 he is here in follow up for for multiple ulcers to multiple toes bilaterally. we will stop using antifungal cream and will continue with silvercel and follow up next week 05/09/17-he is here in follow-up for multiple ulcerations to multiple toes bilaterally. There is improvement in appearance. He has not completely stopped using antifungal cream, but admits he has not using it between the toes. He has an appointment with Dr. Ola Spurr on 2/11, continues on doxycycline and Cipro. It has been 5 weeks of antibiotic therapy, we will order plain film xray to evaluate for osteomyelitis next week, prior to follow up with ID. Will continue with silvercel and follow up next week 05/16/17-he is here in follow-up evaluation for multiple ulcerations to multiple toes bilaterally and new wound to the right posterior heel. There is essentially no change in appearance, deteriorating measurements; he has a history of waxing and waning measurements. He admits that he  continues to apply moisturizer/cream/ointment to his toes despite weekly reminders to only apply silvercel to his toes. He states that he thinks the surgical shoe contributed to the superficial ulcer to his posterior heel, he is unable to articulate if this was an area of dry cracked skin as he has a similar area to the left heel. He now is wearing open toed slippers. He has an appointment with Dr. Ola Spurr on 2/11. We have ordered x-rays for her bilateral feet; he was advised to obtain the x-rays today or tomorrow. He will follow-up next week 05/23/17-he is here in follow-up evaluation for multiple ulcerations to multiple toes bilaterally and the right posterior heel. There is improvement in maceration. He has been compliant and not applying any moisturizing agent to his toes. He has been using Lac-Hydrin for his lower extremities with improvement. He did not go to his appointment on Monday with Dr. Ola Spurr secondary to financial concerns. X-rays for her bilateral feet showed: LEFT FOOT with slight erosion of the tuft of the distal phalanges of the left first and second toe suspicious for osteomyelitis, RIGHT FOOT with 1.erosion of the tufts of the distal Labarbera, Beth E. ([id number]) phalanges of the right first second and possibly third toes consistent with osteomyelitis, 2 no definitive abnormality of the calcaneus is seen on the images obtained, 3. Plantar calcaneal degenerative spur. We briefly discussed hyperbaric adjunctive therapy for treatment of chronic refractory osteomyelitis. I do not find an a1c in  EMR, will contact PCP for record, or order if needed. He has been encouraged to contact Dr Ola Spurr office regarding the follow-up appointment, encouraged him to inquire about payment plan. We will continue with same treatment plan and follow-up next week. He states he is still taking antibiotics and has "a lot" left. He states he has been taking them as directed, 2 pills twice daily.  According to Dr. Blane Ohara office notes he was originally started on 12/17 for 4 weeks and extended on 1/14 for an additional 4 weeks. He should be done with his antibiotic therapy, he was advised to bring his bottles and to his next appointment, we will contact pharmacy. 05/30/17-he is here in follow-up evaluation for multiple ulcerations to multiple toes bilaterally and the right posterior heel. He is accompanied by his brother-in-law. Wounds are stable. He has yet to make up with Dr. Ola Spurr. We contacted his PCP, with no record of recent A1c we will draw an A1c. His brother-in-law states that he was taken off all of his diabetic medication secondary to kidney function. 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 The antibiotics were counted and have approximately 2 weeks left, although they should be complete. The pill organizer reveals missing days. We discussed the need for consistent medications, to have optimal benefit of medication. He has a cousin that lives with him and he will ask her to check his organizer daily. He has been advised to follow up with Dr Ola Spurr, and will go by the office today. He has been advised to quit smoking. 06/06/17-he is here in follow up evaluation. He has had to make an appointment with Dr. Ola Spurr. He did have blood work obtained, a1c 5. He continues to take antibiotic therapy. Significant improvement in bilateral lower extremity edema with compression therapy. Essentially no change in ulcerations to toes. He states he is "going to try something different" and "let me know next week" if it works; he would not provide any additional information and was encouraged to follow our orders. We will follow up next week 06/13/17-he is here in follow-up evaluation. He has an appointment with Dr. Ola Spurr tomorrow morning. He states he purchased an ointment from  [facility] and applied to his toes for 3 days, he does not remember the name of the ointment. There is improvement to his wounds, minimal maceration. He continues to take antibiotic therapy, this should have been completed last month. His brother-in-law who regularly accompanies his appointments was asked to take the bottles to the appointment tomorrow with Dr. Ola Spurr so he is aware. We will continue with 3 layer compression, and order OPEN TOE compression 20-30mmHg; we will apply compression stockings next week. He continues to smoke, smoked "2 cigarettes" last week 06/20/17 on evaluation today patient did receive his compression stockings which he has with him today for both lower extremities. With that being said he tells me at this point in time that he is very happy to have these he really is not a big fan of the compression wraps that we have been utilizing although they have been of great benefit for him. Nonetheless at this point he does want to switch to the compression stockings. In my opinion as long as he is continuing with compression I'm okay with the stockings or the wraps. 06/27/17-he is here in follow-up evaluation for multiple ulcerations to his bilateral toes. There is some improvement in appearance. He is compliant  in wearing his compression stockings with significant improvement in lower extremity edema. He saw Dr Ola Spurr on 3/8, per his notes they would redraw ESR and CRP; plan to continue antibiotic therapy if these remain elevated. I do not see an ESR or CRP level in Epic. The patient continues to take antibiotics. 07/11/17-He is here in follow-up evaluation for multiple ulcerations to multiple toes bilaterally. He presents with complete epithelialization to the right third toe; there has been no deterioration. He continues on antibiotic therapy. He will follow-up next week 07/18/17-He is here in follow-up evaluation for multiple ulcerations to multiple toes bilaterally. He  continues to make improvement. He continues on antibiotic therapy. He states he has been using something additional to our orders, he does not elaborate but states he will bring it in next week. 07/25/17-He is here in follow up evaluation for multiple ulcerations to bilateral toes. He is stable. He has completed antibiotic therapy. He admits to "filing" his toes after showers each evening, this is what he was referencing last week; he does not filing for the wounds. We will switch to [facility] and monitor for any improvement, he will follow-up next week 08/01/17-He is here in follow-up evaluation. He admits to "picking" at his toes after cleansing yesterday, leading to new areas of tissue loss on the bilateral second toe. There is improvement noted to the bilateral great toe. We will dress toes today and hope that they maintain until Monday where he will come in for a nurse visit. He has been advised, multiple times with expressed verbalization, to change the dressings to silvercel if the dressings get wet prior to [facility] appointment. 08/08/17-He is here in follow-up evaluation for bilateral first and second toe ulcerations. There is significant improvement to all ulcerations since last visit. We will switch to [name] to all wounds and he will continue with nurse visits on a Monday/Thursday schedule and follow-up with me in 2 weeks. He continues to smoke, 1-3 cigarettes per day, and has been encouraged to not smoke until his next follow-up in 2 weeks. 08/15/17 on evaluation today patient's ulcers on his toes actually appear to be doing fairly well the right to ulcers may be a little bit more moist compared to the left I'm not really sure exactly why as the openings appear to be very small and I'm not seeing any evidence of anything significant as far as you lightest or otherwise. Nonetheless this may just be a small setback he's Baskett, Braian E. ([id number]) been doing very well with the  [facility] Dressing 08/22/17-He is here in follow-up evaluation for ulcerations to his bilateral first and second toes, there is small/scant amount of drainage noted on today's dressing. He continues with Hydrofera Blue. He continues to smoke, 1 cigarette a day. Voices no complaint or concerns, compliant and compression therapy today. He'll follow-up next week 08/29/17-He is here in follow-up evaluation for ulcerations to his bilateral first and second toes, with a new wound to the left third toe. He states he cut himself while cutting the toenail. He presents today with more maceration and increased measurements; waxing and waning measurements and moisture has been an ongoing issue. He continues to smoke a proximally 1 cigarette per day, but states he has not smoked since Sunday. We will initiate medihoney daily and evaluate next week. He presents today without compression stockings 09/05/17-He is here in follow-up evaluation for ulcerations to bilateral first and second and left third toe. He did not pick up the  medihoney and therefore has not been changing his dressing. He admits to inconsistent where of compression stockings, admits to pain to his bilateral lower extremities with unilateral edema (right greater than left). The ulcerations to multiple toes are more macerated and larger in size than last week. He is wearing compression therapy today. He admits to an overall feeling of weakness and fatigue and has been encouraged to contact his PCP for evaluation; he has a known history of anemia and intraabdominal lymphadenopathy. We will return to silvercel and he will follow up next week 09/19/17-He is here in follow-up evaluation for ulcerations to bilateral first and second toe.he was unable to make last week's appointment. The culture that was obtained on 5/30 grew clindamycin sensitive MRSA; he was initiated on 6/4 but did not start it until 6/7. There is significant improvement in all  wounds, healing to left second and third toe. He also admits to having no alcohol or smoking for the last 6 days. We will continue with same treatment plan I will extend the clindamycin for an additional 10 days and he will follow-up next week. 09/26/17- He is here in follow evaluation for multiple ulcerations to multiple toes bilaterally. He continues to be non-compliant with dressing, compression therapy. He states he is taking the antibiotics as prescribed, although his recall is inconsistent. There is noted deterioration in all ulcers. We will continue same treatment plan, antibiotic therapy and he will follow up next week. 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 This information is obtained from the medical record, as he cannot articulate specifics to his diagnosis or treatment plan. 10/17/17-He is here in follow-up evaluation for multiple ulcerations to multiple toes bilaterally. There is chronic waxing and waning improvement/deterioration. On today's exam they appear improved, dry without maceration. He continues to apply a variety of topical agents, stating that he has switched between the blue product and silver product. In my opinion, this will be a chronic waxing and waning giving his intermittent compliance. He will follow-up in 2 weeks. As it relates to the biopsy taken from his buttock, he has not followed up with that and has not started radiation therapy. 10/31/17-He is here for evaluation for multiple ulcerations to multiple toes bilaterally. Continues with waxing and waning improvement versus deterioration. They are stable on today's exam without significant maceration. 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 We will continue with every 2 week follow-up appointments 11/14/17- He  is seen in follow-up evaluation for multiple ulcerations to multiple bilateral toes. He is scheduled for excision of basal cell carcinoma to his right buttock on Monday 8/12. He continues to have chronic waxing and waning of his ulcers; today is a stable day with minimal drainage and left ear is better than right toes. I will recount to infectious disease regarding chronic suppressive therapy. He admits to being more compliant with compression stockings, although he is not wearing any today. He has been encouraged to continue with dressing changes as ordered and compression therapy daily. He will be seen in 2 weeks 11/28/17-He is seen in follow up evaluation for multiple ulcerations to multiple toes bilaterally. He had an overnight admission at [facility] (8/11-8/12) for symptomatic anemia, received two units prbc. His surgical date has been moved to 9/94 excision of basal cell carcinoma to  the right buttock. He has been noncompliant in wearing compression stockings, applying topical treatment as ordered; he has been applying a "cream", Hydrofera Blue, silvercel. He is currently on no antibiotic therapy; we will send for re-referral to ID for suppressive therapy given his multiple co-morbidities, poor surgical candidate, unwilling to have toe amputation and general non-adherence. 12/19/17 on evaluation today patient actually appears to be doing very well in regard to his toes at least compared to last time I saw him. He again has been coming to our office for quite a bit of time. 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 He states is hurting a little bit but doing well in general. I'm see were not taking care of this area dermatology is. He does have his appointment with infectious disease upcoming which they re-consult for suppressive therapy that's next Monday. 01/02/18 on evaluation  today patient actually appears to be doing rather well in regard to his toe ulcers although he tells me he has been put in an ointment on this which I am concerned may be causing some additional moisture buildup which we really do not want. He supposed be using silver cell which is mainly thing I want him to be utilizing. Fortunately there does not appear to be any evidence of worsening infection which is good news. He has had surgery on his gluteal region where he had an excision of a cancerous area fortunately that seems to be doing well although he is having some discomfort. 02/06/18 on evaluation today patient actually appears to be doing well at all locations except for his second toe use bilaterally Sinor, Dunbar E. ([id number]) where the distal tip is still wrong open. He did state that he can't stand for dry skin to buildup on his toes subsequently as a question and further it sounds as if he actually pills this all which is likely keeping the toe is wrong and open. Also think that's what happened in the bottom of his foot although that appears to have healed at this point. There does not appear to be any evidence of infection. 02/20/18 on evaluation today patient appears to be doing better in regard to his foot ulcers. Everything has shown signs of improvement which is excellent news. Overall I'm very pleased with how things appear currently. The patient states he's not picking her point at any skin and that he wears shoes at all times when he is ambulating at home. Both are things that I've advocated and recommended for him in the past. 03/13/18 on evaluation today patient actually appears to be doing very well in regard to the bilateral toes in the overall appearance of the wound bed locations. Fortunately there does not appear to be any signs of infection at this time. He states that he is very pleased with how things seem to be progressing to be honest as am I. 03/27/1899 evaluation today  patient appears to be doing well in regard to his toe ulcers. He just has a few areas remaining and these appear to be showing signs of improvement as well which is great news. Overall I have been impressed with the progress of the past several weeks. 04/10/18 on evaluation today patient appears to be doing a little bit worse in regard to the right plantar foot at the regional the first metatarsal as well as the left second toe. He tells me that  he ran out of bandages he also tells me he's been wearing his flip-flops a lot at home as well as a new pair of boots that he received as well. This is as opposed to the postop shoes that we had previously given him. Nonetheless overall I feel like that he has had a little bit more friction to the area which is causing things to look a little bit worse today. Electronic Signature(s) Signed: 04/10/2018 11:59:27 PM By: Worthy Keeler PA-C Entered By: Worthy Keeler on 04/10/2018 10:56:46 Siebers, [name] ([id number]) -------------------------------------------------------------------------------- Physical Exam Details Patient Name: [name], [name] Date of Service: 04/10/2018 8:00 AM Medical Record Number: [id number] Patient Account Number: [id number] Date of Birth/Sex: [birth year] (68 y.o. M) Treating RN: Harold Barban Primary Care Provider: Lamonte Sakai Other Clinician: Referring Provider: Lamonte Sakai Treating Provider/Extender: Melburn Hake, HOYT Weeks in Treatment: 29 Constitutional Well-nourished and well-hydrated in no acute distress. Respiratory normal breathing without difficulty. clear to auscultation bilaterally. Cardiovascular regular rate and rhythm with normal S1, S2. Psychiatric this patient is able to make decisions and demonstrates good insight into disease process. Alert and Oriented x 3. pleasant and cooperative. Notes Patient's wounds currently shows evidence of breakdown as compared to previous evaluation. Fortunately there does  not appear to be signs of infection at this time which is good news. I do believe this may be attributed to the shoes type E is wearing. Electronic Signature(s) Signed: 04/10/2018 11:59:27 PM By: Worthy Keeler PA-C Entered By: Worthy Keeler on 04/10/2018 10:57:26 Kern, [name] ([id number]) -------------------------------------------------------------------------------- Physician Orders Details Patient Name: Bir, Cloy E. Date of Service: 04/10/2018 8:00 AM Medical Record Number: [id number] Patient Account Number: [id number] Date of Birth/Sex: 09/23/1950 (68 y.o. M) Treating RN: Montey Hora Primary Care Provider: Lamonte Sakai Other Clinician: Referring Provider: Lamonte Sakai Treating Provider/Extender: Melburn Hake, HOYT Weeks in Treatment: 57 Verbal / Phone Orders: No Diagnosis Coding ICD-10 Coding Code Description L97.521 Non-pressure chronic ulcer of other part of left foot limited to breakdown of skin L97.511 Non-pressure chronic ulcer of other part of right foot limited to breakdown of skin E11.621 Type 2 diabetes mellitus with foot ulcer I87.323 Chronic venous hypertension (idiopathic) with inflammation of bilateral lower extremity I89.0 Lymphedema, not elsewhere classified F17.218 Nicotine dependence, cigarettes, 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 abuse 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, right ankle and foot Wound Cleansing Wound #14 Left Toe Second o Clean wound with Normal Saline. Wound #16 Right,Plantar Toe Great o Clean wound with Normal Saline. Wound #17 Right,Distal,Plantar Metatarsal head second o Clean wound with Normal Saline. Wound #2 Right Toe Second o Clean wound with Normal Saline. Anesthetic (add to Medication List) Wound #14 Left Toe Second o Topical Lidocaine 4% cream applied to wound bed prior to debridement (In Clinic Only). Wound #16 Right,Plantar Toe Great o Topical  Lidocaine 4% cream applied to wound bed prior to debridement (In Clinic Only). Wound #17 Right,Distal,Plantar Metatarsal head second o Topical Lidocaine 4% cream applied to wound bed prior to debridement (In Clinic Only). Wound #2 Right Toe Second o Topical Lidocaine 4% cream applied to wound bed prior to debridement (In Clinic Only). Primary Wound Dressing Wound #14 Left Toe Second o Silver Alginate - secure with tape Wound #16 Right,Plantar Toe GARLAND, HINCAPIE. ([id number]) o Silver Alginate - secure with tape Wound #17 Right,Distal,Plantar Metatarsal head second o Silver Alginate - secure with tape Wound #2 Right Toe Second o Silver Alginate - secure with tape Dressing  Change Frequency Wound #14 Left Toe Second o Change dressing every day. Wound #16 Right,Plantar Toe Great o Change dressing every day. Wound #17 Right,Distal,Plantar Metatarsal head second o Change dressing every day. o Change dressing every day. Wound #2 Right Toe Second o Change dressing every day. Follow-up Appointments Wound #14 Left Toe Second o Return Appointment in 2 weeks. Wound #16 Right,Plantar Toe Great o Return Appointment in 2 weeks. Wound #17 Right,Distal,Plantar Metatarsal head second o Return Appointment in 2 weeks. o Return Appointment in 2 weeks. Wound #2 Right Toe Second o Return Appointment in 2 weeks. Edema Control Wound #14 Left Toe Second o Patient to wear own compression stockings Wound #16 Right,Plantar Toe Great o Patient to wear own compression stockings Wound #17 Right,Distal,Plantar Metatarsal head second o Patient to wear own compression stockings Wound #2 Right Toe Second o Patient to wear own compression stockings Additional Orders / Instructions Wound #14 Left Toe Second o Stop Smoking o Increase protein intake. Wound #16 Right,Plantar Toe Great o Stop Smoking Matusek, Louay E. ([id number]) o Increase protein  intake. Wound #17 Right,Distal,Plantar Metatarsal head second o Stop Smoking o Increase protein intake. Wound #2 Right Toe Second o Stop Smoking o Increase protein intake. Electronic Signature(s) Signed: 04/10/2018 4:35:36 PM By: Montey Hora Signed: 04/10/2018 11:59:27 PM By: Worthy Keeler PA-C Entered By: Montey Hora on 04/10/2018 08:39:22 Venuti, [name] ([id number]) -------------------------------------------------------------------------------- Problem List Details Patient Name: Chern, Ibraham E. Date of Service: 04/10/2018 8:00 AM Medical Record Number: [id number] Patient Account Number: [id number] Date of Birth/Sex: 1950/11/01 (68 y.o. M) Treating RN: Harold Barban Primary Care Provider: Lamonte Sakai Other Clinician: Referring Provider: Lamonte Sakai Treating Provider/Extender: Melburn Hake, HOYT Weeks in Treatment: 42 Active Problems ICD-10 Evaluated Encounter Code Description Active Date Today Diagnosis L97.521 Non-pressure chronic ulcer of other part of left foot limited to 09/21/2016 No Yes breakdown of skin L97.511 Non-pressure chronic ulcer of other part of right foot limited to 09/21/2016 No Yes breakdown of skin E11.621 Type 2 diabetes mellitus with foot ulcer 09/21/2016 No Yes I87.323 Chronic venous hypertension (idiopathic) with inflammation of 09/21/2016 No Yes bilateral lower extremity I89.0 Lymphedema, not elsewhere classified 09/21/2016 No Yes F17.218 Nicotine dependence, cigarettes, with other nicotine-induced 09/21/2016 No Yes disorders F10.19 Alcohol abuse with unspecified alcohol-induced disorder 09/21/2016 No Yes M86.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, right ankle and foot 02/21/2017 No Yes Inactive Problems Resolved Problems Villagomez, MARON STANZIONE ([id number]) Electronic Signature(s) Signed: 04/10/2018 11:59:27 PM By: Worthy Keeler PA-C Entered By: Worthy Keeler on 04/10/2018 08:29:27 Alfred, Finnian Johnette Abraham  ([id number]) -------------------------------------------------------------------------------- Progress Note Details Patient Name: [name], [name] Date of Service: 04/10/2018 8:00 AM Medical Record Number: [id number] Patient Account Number: [id number] Date of Birth/Sex: July 26, 1950 (68 y.o. M) Treating RN: Harold Barban Primary Care Provider: Lamonte Sakai Other Clinician: Referring Provider: Lamonte Sakai Treating Provider/Extender: Melburn Hake, HOYT Weeks in Treatment: 39 Subjective Chief Complaint Information obtained from Patient Patient presents for treatment of an open diabetic ulcer to both feet History of Present Illness (HPI) 68 year old patient here to see as for bilateral feet ulceration to on his left first and second toe and 2 on his right first and second toe, which she's had for about 4 months. 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 Donella Stade. The patient is also undergoing workup by medical oncology for a intra-abdominal lymphadenopathy. 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 He is also status post appendectomy, inguinal lymph node biopsy, rectal biopsy and rectal examination under anesthesia. he currently smokes cigarettes about half packet a day. In March of this year he was seen by Dr. Hortencia Pilar, for evaluation of bilateral varicose veins and besides wearing compression stockings he had recommended laser ablation of the right and left great saphenous veins to eleviate the symptoms and complications of severe superficial venous reflux disease. He also recommended lymphedema pumps for better control of his lymphedema. The patient recently has had on 08/23/2016, right greater saphenous vein ablation with the laser  energy Earlier lower extremity venous reflux examination done on 05/08/2016 showed no DVT or SVT both lower legs but incompetence of bilateral great saphenous veins was present. A lower arterial study was also done and there was no significant right lower and left lower extremity problems based on a normal toe brachial index bilaterally and the ABI was 1.21 the left and 1.23 on the right. His post ablation venous duplex examination showed successful ablation of the right GS vein with thrombus formation 2 below the right saphenofemoral junction. The deep system was patent without evidence of thrombosis and this was done on 08/30/2016. 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 Addendum: regarding his x-rays done today and x-ray of the left foot -- IMPRESSION: No objective evidence of osteomyelitis. There are soft tissue changes which may reflect cellulitis. X-ray of the right foot -- IMPRESSION:Findings compatible with cellulitis of the toes. No objective evidence of osteomyelitis is observed. 10/01/16 on evaluation today patient's wounds appeared to be doing some better. I did review the x-rays as well which showed no evidence of osteomyelitis although there was evidence on x-ray of cellulitis. He fortunately is not having any discomfort although he continues to have some swelling. He does not remember being on any antibiotics recently. 10/15/16 on evaluation today patient's wounds overall appear to be doing better although he does have a new location noted on the left foot. Fortunately he is not having significant pain. It almost has the appearance that something is rubbing on the end of his toes but he wears the open toe shoes and according to what he is telling me never wears anything that would rub on his foot. There is no evidence of infection and specifically no evidence of a fungal  infection 10/22/16 On evaluation today patient's wounds appeared to be doing better compared to last week in regard to his bilateral lower extremities. Fortunately I happy with how things are progressing although he still has ulcers I feel like that he is improving and appropriate manner. GWEN, EDLER ([id number]) 11/12/16 on evaluation today patient appears to be doing well in regard to his bilateral feet and the respective wounds. We have been using surrounding her dressings along with an antifungal cream which seems to be doing very well. He has no bilateral dysfunction noticed that the rituals are weight loss at this point. He also has no nausea or vomiting a note purulent discharge. He did see Vein and vascular today and he tells me that they told him he could have surgery for his venous stasis but they did not feel like it was worth it in his words. Fortunately patient's wounds do appear to be getting sneakily better. 11/26/2016 --  he says he is going to have some surgery during this week at [facility] for possibly a colon resection. 12/31/2016 -- the patient has been noncompliant with his smoking and I'm not sure whether he is also started drinking again. He continues to be very nonchalant about his care 01/14/2017 -- the patient's HandP has been reviewed well and I understand he is being compliant with trying to give up smoking and his local dressing changes. He does not have any surgical options of 4 to him by his vascular surgeons.he was last seen in early August by Dr. Hortencia Pilar who recommended compression stockings,and possibly lymph pumps in 2-3 months after doing a review ultrasound. 01/28/2017 - the patient did not have any fresh complaints but on examination I noted a large lacerated wound on the plantar aspect of his right fourth toe which had a lot of necrotic debris and it probes down to bone. 02/07/2017 -- x-ray of the right foot -- IMPRESSION: Soft tissue swelling  about the first through fourth toes consistent with cellulitis. New destructive change in the tuft of the distal phalanx of the great toe is consistent with osteomyelitis. 02/14/2017 -- the patient's MRI is pending this coming Monday and he still continues to smoke. We have again gone over off loading of his wounds in great detail and he says he's been compliant. 02/21/2017 -- MR of the right foot -- IMPRESSION: 1. Soft tissue ulcer at the tip of the first, second and third toe knows. Cortical irregularity and bone marrow edema in the first distal phalanx most concerning for osteomyelitis. Mild marrow edema in the second and third distal phalanx without definite cortical destruction which may reflect early osteomyelitis versus reactive marrow edema. 2. Soft tissue edema surrounding the first phalanx most consistent with cellulitis. the patient was also recently evaluated by his medical oncologist Dr. 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 She is treating him with weekly Procrit. She is also keeping intra-abdominal lymphadenopathy and right lower lobe lung nodule under observation. 04/04/2017 -- he was seen by Dr. Adrian Prows on 03/25/2017 -- after review he empirically put him on ciprofloxacin and doxycycline as they have good bone penetration and good bioavailability and it will cover the usual pathogens and diabetic foot osteomyelitis. He will check inflammatory markers and plan a 93-28 week old records. C-reactive protein was 0.3 and the ESR was 72 04/18/17 on evaluation today patient appears to be doing about the same in regard to his lower extremity wounds bilaterally. He has continued to use the antifungal cream which does seem to be beneficial. Nonetheless the ulcers do seem to in some areas be epithelial eyes over and in other areas are still open. He is having no significant discomfort. 04/25/17-he is here in  follow-up evaluation for multiple ulcerations to multiple toes bilaterally. He states he did see Dr. Ola Spurr again last week and continues antibiotic therapy. He is voicing no complaints or concerns, will continue with current treatment plan will possibility of adding compression therapy next week after an additional week of treatment/lotions to BLE prescribed by Dr Ola Spurr 05/02/17 he is here in follow up for for multiple ulcers to multiple toes bilaterally. we will stop using antifungal cream and will continue with silvercel and follow up next week 05/09/17-he is here in follow-up for multiple ulcerations to multiple toes bilaterally. There is improvement in appearance. He has not completely stopped using antifungal cream, but admits  he has not using it between the toes. He has an appointment with Dr. Ola Spurr on 2/11, continues on doxycycline and Cipro. It has been 5 weeks of antibiotic therapy, we will order plain film xray to evaluate for osteomyelitis next week, prior to follow up with ID. Will continue with silvercel and follow up next week 05/16/17-he is here in follow-up evaluation for multiple ulcerations to multiple toes bilaterally and new wound to the right posterior heel. There is essentially no change in appearance, deteriorating measurements; he has a history of waxing and waning measurements. He admits that he continues to apply moisturizer/cream/ointment to his toes despite weekly reminders to only apply silvercel to his toes. He states that he thinks the surgical shoe contributed to the superficial ulcer to his posterior heel, he is unable to articulate if this was an area of dry cracked skin as he has a similar area to the left heel. He now is wearing open toed slippers. He has an appointment with Dr. Ola Spurr on 2/11. We have ordered x-rays for her bilateral feet; Soter, Zan E. ([id number]) he was advised to obtain the x-rays today or tomorrow. He will follow-up next  week 05/23/17-he is here in follow-up evaluation for multiple ulcerations to multiple toes bilaterally and the right posterior heel. There is improvement in maceration. He has been compliant and not applying any moisturizing agent to his toes. He has been using Lac-Hydrin for his lower extremities with improvement. He did not go to his appointment on Monday with Dr. Ola Spurr secondary to financial concerns. X-rays for her bilateral feet showed: LEFT FOOT with slight erosion of the tuft of the distal phalanges of the left first and second toe suspicious for osteomyelitis, RIGHT FOOT with 1.erosion of the tufts of the distal phalanges of the right first second and possibly third toes consistent with osteomyelitis, 2 no definitive abnormality of the calcaneus is seen on the images obtained, 3. Plantar calcaneal degenerative spur. We briefly discussed hyperbaric adjunctive therapy for treatment of chronic refractory osteomyelitis. I do not find an a1c in EMR, will contact PCP for record, or order if needed. He has been encouraged to contact Dr Ola Spurr office regarding the follow-up appointment, encouraged him to inquire about payment plan. We will continue with same treatment plan and follow-up next week. He states he is still taking antibiotics and has "a lot" left. He states he has been taking them as directed, 2 pills twice daily. According to Dr. Blane Ohara office notes he was originally started on 12/17 for 4 weeks and extended on 1/14 for an additional 4 weeks. He should be done with his antibiotic therapy, he was advised to bring his bottles and to his next appointment, we will contact pharmacy. 05/30/17-he is here in follow-up evaluation for multiple ulcerations to multiple toes bilaterally and the right posterior heel. He is accompanied by his brother-in-law. Wounds are stable. He has yet to make up with Dr. Ola Spurr. We contacted his PCP, with no record of recent A1c we will draw an  A1c. His brother-in-law states that he was taken off all of his diabetic medication secondary to kidney function. 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 The antibiotics were counted and have approximately 2 weeks left, although they should be complete. The pill organizer reveals missing days. We discussed the need for consistent medications, to have optimal benefit of medication. He has a cousin that lives with him and  he will ask her to check his organizer daily. He has been advised to follow up with Dr Ola Spurr, and will go by the office today. He has been advised to quit smoking. 06/06/17-he is here in follow up evaluation. He has had to make an appointment with Dr. Ola Spurr. He did have blood work obtained, a1c 5. He continues to take antibiotic therapy. Significant improvement in bilateral lower extremity edema with compression therapy. Essentially no change in ulcerations to toes. He states he is "going to try something different" and "let me know next week" if it works; he would not provide any additional information and was encouraged to follow our orders. We will follow up next week 06/13/17-he is here in follow-up evaluation. He has an appointment with Dr. Ola Spurr tomorrow morning. He states he purchased an ointment from [facility] and applied to his toes for 3 days, he does not remember the name of the ointment. There is improvement to his wounds, minimal maceration. He continues to take antibiotic therapy, this should have been completed last month. His brother-in-law who regularly accompanies his appointments was asked to take the bottles to the appointment tomorrow with Dr. Ola Spurr so he is aware. We will continue with 3 layer compression, and order OPEN TOE compression 20-30mmHg; we will apply compression stockings next week. He continues to smoke, smoked "2 cigarettes"  last week 06/20/17 on evaluation today patient did receive his compression stockings which he has with him today for both lower extremities. With that being said he tells me at this point in time that he is very happy to have these he really is not a big fan of the compression wraps that we have been utilizing although they have been of great benefit for him. Nonetheless at this point he does want to switch to the compression stockings. In my opinion as long as he is continuing with compression I'm okay with the stockings or the wraps. 06/27/17-he is here in follow-up evaluation for multiple ulcerations to his bilateral toes. There is some improvement in appearance. He is compliant in wearing his compression stockings with significant improvement in lower extremity edema. He saw Dr Ola Spurr on 3/8, per his notes they would redraw ESR and CRP; plan to continue antibiotic therapy if these remain elevated. I do not see an ESR or CRP level in Epic. The patient continues to take antibiotics. 07/11/17-He is here in follow-up evaluation for multiple ulcerations to multiple toes bilaterally. He presents with complete epithelialization to the right third toe; there has been no deterioration. He continues on antibiotic therapy. He will follow-up next week 07/18/17-He is here in follow-up evaluation for multiple ulcerations to multiple toes bilaterally. He continues to make improvement. He continues on antibiotic therapy. He states he has been using something additional to our orders, he does not elaborate but states he will bring it in next week. 07/25/17-He is here in follow up evaluation for multiple ulcerations to bilateral toes. He is stable. He has completed antibiotic therapy. He admits to "filing" his toes after showers each evening, this is what he was referencing last week; he does not filing for the wounds. We will switch to [facility] and monitor for any improvement, he will follow-up next  week 08/01/17-He is here in follow-up evaluation. He admits to "picking" at his toes after cleansing yesterday, leading to new areas of tissue loss on the bilateral second toe. There is improvement noted to the bilateral great toe. We will dress toes today and hope that they  maintain until Monday where he will come in for a nurse visit. He has been advised, multiple times with expressed verbalization, to change the dressings to silvercel if the dressings get wet prior to [facility] appointment. 08/08/17-He is here in follow-up evaluation for bilateral first and second toe ulcerations. There is significant improvement to all ulcerations since last visit. We will switch to [facility] to all wounds and he will continue with nurse visits on a Manton, Mel E. ([id number]) Monday/Thursday schedule and follow-up with me in 2 weeks. He continues to smoke, 1-3 cigarettes per day, and has been encouraged to not smoke until his next follow-up in 2 weeks. 08/15/17 on evaluation today patient's ulcers on his toes actually appear to be doing fairly well the right to ulcers may be a little bit more moist compared to the left I'm not really sure exactly why as the openings appear to be very small and I'm not seeing any evidence of anything significant as far as you lightest or otherwise. Nonetheless this may just be a small setback he's been doing very well with the [facility] Dressing 08/22/17-He is here in follow-up evaluation for ulcerations to his bilateral first and second toes, there is small/scant amount of drainage noted on today's dressing. He continues with Hydrofera Blue. He continues to smoke, 1 cigarette a day. Voices no complaint or concerns, compliant and compression therapy today. He'll follow-up next week 08/29/17-He is here in follow-up evaluation for ulcerations to his bilateral first and second toes, with a new wound to the left third toe. He states he cut himself while cutting the toenail. He  presents today with more maceration and increased measurements; waxing and waning measurements and moisture has been an ongoing issue. He continues to smoke a proximally 1 cigarette per day, but states he has not smoked since Sunday. We will initiate medihoney daily and evaluate next week. He presents today without compression stockings 09/05/17-He is here in follow-up evaluation for ulcerations to bilateral first and second and left third toe. He did not pick up the medihoney and therefore has not been changing his dressing. He admits to inconsistent where of compression stockings, admits to pain to his bilateral lower extremities with unilateral edema (right greater than left). The ulcerations to multiple toes are more macerated and larger in size than last week. He is wearing compression therapy today. He admits to an overall feeling of weakness and fatigue and has been encouraged to contact his PCP for evaluation; he has a known history of anemia and intraabdominal lymphadenopathy. We will return to silvercel and he will follow up next week 09/19/17-He is here in follow-up evaluation for ulcerations to bilateral first and second toe.he was unable to make last week's appointment. The culture that was obtained on 5/30 grew clindamycin sensitive MRSA; he was initiated on 6/4 but did not start it until 6/7. There is significant improvement in all wounds, healing to left second and third toe. He also admits to having no alcohol or smoking for the last 6 days. We will continue with same treatment plan I will extend the clindamycin for an additional 10 days and he will follow-up next week. 09/26/17- He is here in follow evaluation for multiple ulcerations to multiple toes bilaterally. He continues to be non-compliant with dressing, compression therapy. He states he is taking the antibiotics as prescribed, although his recall is inconsistent. There is noted deterioration in all ulcers. We will continue  same treatment plan, antibiotic therapy and he will follow  up next week. 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 This information is obtained from the medical record, as he cannot articulate specifics to his diagnosis or treatment plan. 10/17/17-He is here in follow-up evaluation for multiple ulcerations to multiple toes bilaterally. There is chronic waxing and waning improvement/deterioration. On today's exam they appear improved, dry without maceration. He continues to apply a variety of topical agents, stating that he has switched between the blue product and silver product. In my opinion, this will be a chronic waxing and waning giving his intermittent compliance. He will follow-up in 2 weeks. As it relates to the biopsy taken from his buttock, he has not followed up with that and has not started radiation therapy. 10/31/17-He is here for evaluation for multiple ulcerations to multiple toes bilaterally. Continues with waxing and waning improvement versus deterioration. They are stable on today's exam without significant maceration. 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 We will continue with every 2 week follow-up appointments 11/14/17- He is seen in follow-up evaluation for multiple ulcerations to multiple bilateral toes. He is scheduled for excision of basal cell carcinoma to his right buttock on Monday 8/12. He continues to have chronic waxing and waning of his ulcers; today is a stable day with minimal drainage and left ear is better than right toes. I will recount to infectious disease regarding chronic suppressive therapy. He admits to being more compliant with compression stockings, although he is not wearing any today. He has been encouraged to continue with dressing changes as  ordered and compression therapy daily. He will be seen in 2 weeks 11/28/17-He is seen in follow up evaluation for multiple ulcerations to multiple toes bilaterally. He had an overnight admission at [facility] (8/11-8/12) for symptomatic anemia, received two units prbc. His surgical date has been moved to 9/94 excision of basal cell carcinoma to the right buttock. He has been noncompliant in wearing compression stockings, applying topical treatment as ordered; he has been applying a "cream", Hydrofera Blue, silvercel. He is currently on no antibiotic therapy; we will send for re-referral to ID for suppressive therapy given his multiple co-morbidities, poor surgical candidate, unwilling to have toe amputation and general non-adherence. 12/19/17 on evaluation today patient actually appears to be doing very well in regard to his toes at least compared to last time I saw him. He again has been coming to our office for quite a bit of time. 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 He states is hurting a little bit but doing well in general. I'm see were not taking care of this area dermatology is. He does have his appointment with infectious disease upcoming which they re-consult for suppressive therapy that's next Monday. 01/02/18 on evaluation today patient actually appears to be doing rather well in regard to his toe ulcers although he tells me he [name], [name] ([id number]) has been put in an ointment on this which I am concerned may be causing some additional moisture buildup which we really do not want. He supposed be using silver cell which is mainly thing I want him to be utilizing. Fortunately there does not appear to be any evidence of worsening infection which is good  news. He has had surgery on his gluteal region where he had an excision of a cancerous area fortunately that seems  to be doing well although he is having some discomfort. 02/06/18 on evaluation today patient actually appears to be doing well at all locations except for his second toe use bilaterally where the distal tip is still wrong open. He did state that he can't stand for dry skin to buildup on his toes subsequently as a question and further it sounds as if he actually pills this all which is likely keeping the toe is wrong and open. Also think that's what happened in the bottom of his foot although that appears to have healed at this point. There does not appear to be any evidence of infection. 02/20/18 on evaluation today patient appears to be doing better in regard to his foot ulcers. Everything has shown signs of improvement which is excellent news. Overall I'm very pleased with how things appear currently. The patient states he's not picking her point at any skin and that he wears shoes at all times when he is ambulating at home. Both are things that I've advocated and recommended for him in the past. 03/13/18 on evaluation today patient actually appears to be doing very well in regard to the bilateral toes in the overall appearance of the wound bed locations. Fortunately there does not appear to be any signs of infection at this time. He states that he is very pleased with how things seem to be progressing to be honest as am I. 03/27/1899 evaluation today patient appears to be doing well in regard to his toe ulcers. He just has a few areas remaining and these appear to be showing signs of improvement as well which is great news. Overall I have been impressed with the progress of the past several weeks. 04/10/18 on evaluation today patient appears to be doing a little bit worse in regard to the right plantar foot at the regional the first metatarsal as well as the left second toe. He tells me that he ran out of bandages he also tells me he's been wearing his flip-flops a lot at home as well as a new pair  of boots that he received as well. This is as opposed to the postop shoes that we had previously given him. Nonetheless overall I feel like that he has had a little bit more friction to the area which is causing things to look a little bit worse today. Patient History Information obtained from Patient. Social History Current every day smoker, Marital Status - Widowed, Alcohol Use - Daily - quit drinking about a week ago, Drug Use - No History, Caffeine Use - Moderate. 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 Respiratory The patient has no complaints or symptoms. Cardiovascular The patient has no complaints or symptoms. Psychiatric The patient has no complaints or symptoms. DELMA, VILLALVA ([id number]) Objective Constitutional Well-nourished and well-hydrated in no acute distress. Vitals Time Taken: 8:12 AM, Height: 69 in, Weight: 168 lbs, BMI: 24.8, Temperature: 97.9 F, Pulse: 69 bpm, Respiratory Rate: 16 breaths/min, Blood Pressure: 147/70 mmHg. Respiratory normal breathing without difficulty. clear to auscultation bilaterally. Cardiovascular regular rate and rhythm with normal S1, S2. Psychiatric this patient is able to make decisions and demonstrates good insight into disease process. Alert and Oriented x 3. pleasant and cooperative. General Notes:  Patient's wounds currently shows evidence of breakdown as compared to previous evaluation. Fortunately there does not appear to be signs of infection at this time which is good news. I do believe this may be attributed to the shoes type E is wearing. Integumentary (Hair, Skin) Wound #14 status is Open. Original cause of wound was Gradually Appeared. The wound is located on the Left Toe Second. The wound measures 1.5cm length x 1.1cm width x 0.1cm depth;  1.296cm^2 area and 0.13cm^3 volume. There is Fat Layer (Subcutaneous Tissue) Exposed exposed. There is no tunneling or undermining noted. There is a medium amount of serous drainage noted. The wound margin is flat and intact. There is large (67-100%) pink granulation within the wound bed. There is a small (1-33%) amount of necrotic tissue within the wound bed including Adherent Slough. The periwound skin appearance did not exhibit: Callus, Crepitus, Excoriation, Induration, Rash, Scarring, Dry/Scaly, Maceration, Atrophie Blanche, Cyanosis, Ecchymosis, Hemosiderin Staining, Mottled, Pallor, Rubor, Erythema. Wound #16 status is Open. Original cause of wound was Not Known. The wound is located on the [employer]. The wound measures 0.5cm length x 0.6cm width x 0.1cm depth; 0.236cm^2 area and 0.024cm^3 volume. There is Fat Layer (Subcutaneous Tissue) Exposed exposed. There is no tunneling or undermining noted. There is a medium amount of serous drainage noted. The wound margin is flat and intact. There is large (67-100%) pink granulation within the wound bed. There is a small (1-33%) amount of necrotic tissue within the wound bed including Adherent Slough. The periwound skin appearance did not exhibit: Callus, Crepitus, Excoriation, Induration, Rash, Scarring, Dry/Scaly, Maceration, Atrophie Blanche, Cyanosis, Ecchymosis, Hemosiderin Staining, Mottled, Pallor, Rubor, Erythema. Periwound temperature was noted as No Abnormality. Wound #17 status is Open. Original cause of wound was Trauma. The wound is located on the Right,Distal,Plantar Metatarsal head second. The wound measures 1.3cm length x 0.2cm width x 0.1cm depth; 0.204cm^2 area and 0.02cm^3 volume. There is Fat Layer (Subcutaneous Tissue) Exposed exposed. There is no tunneling or undermining noted. There is a medium amount of serous drainage noted. The wound margin is flat and intact. There is small (1-33%) pink granulation within the  wound bed. There is a large (67-100%) amount of necrotic tissue within the wound bed including Adherent Slough. The periwound skin appearance did not exhibit: Callus, Crepitus, Excoriation, Induration, Rash, Scarring, Dry/Scaly, Maceration, Atrophie Blanche, Cyanosis, Ecchymosis, Hemosiderin Staining, Mottled, Pallor, Rubor, Erythema. Periwound temperature was noted as No Abnormality. Wound #2 status is Open. Original cause of wound was Gradually Appeared. The wound is located on the Right Toe Second. The wound measures 0.1cm length x 0.1cm width x 0.1cm depth; 0.008cm^2 area and 0.001cm^3 volume. There is Fat Layer (Subcutaneous Tissue) Exposed exposed. There is no tunneling or undermining noted. There is a medium amount of serous drainage noted. The wound margin is flat and intact. There is medium (34-66%) pink granulation within the wound bed. There is a medium (34-66%) amount of necrotic tissue within the wound bed including Adherent Slough. The periwound skin appearance did not exhibit: Callus, Crepitus, Excoriation, Induration, Rash, Scarring, Dry/Scaly, Maceration, Atrophie Blanche, Cyanosis, Ecchymosis, Hemosiderin Staining, Mottled, Pallor, Rubor, Erythema. Periwound temperature was noted as No Abnormality. 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 Wound #16 Right,Plantar Toe  Great: Clean wound with Normal Saline. Wound #17 Right,Distal,Plantar Metatarsal head second: Clean wound with Normal Saline. Wound #2 Right Toe Second: Clean wound with Normal Saline. Anesthetic (add to Medication List): Wound #14 Left Toe Second: Topical Lidocaine 4% cream applied to wound bed prior to debridement (In Clinic Only). Wound #16 Right,Plantar Toe Great: Topical Lidocaine 4% cream applied to wound bed prior to debridement (In Clinic Only). Wound #17 Right,Distal,Plantar Metatarsal head second: Topical Lidocaine 4% cream applied to wound bed prior to debridement (In Clinic Only). Wound #2 Right Toe Second: Topical Lidocaine 4% cream applied to wound bed prior to debridement (In Clinic Only). Primary Wound Dressing: Wound #14 Left Toe Second: Silver Alginate - secure with tape Wound #16 Right,Plantar Toe Great: Silver Alginate - secure with tape Wound #17 Right,Distal,Plantar Metatarsal head second: Silver Alginate - secure with tape Wound #2 Right Toe Second: Silver Alginate - secure with tape Dressing Change Frequency: Wound #14 Left Toe Second: Change dressing every day. Wound #16 Right,Plantar Toe Great: Change dressing every day. Wound #17 Right,Distal,Plantar Metatarsal head second: Change dressing every day. Change dressing every day. Anderegg, Erion E. ([id number]) Wound #2 Right Toe Second: Change dressing every day. Follow-up Appointments: Wound #14 Left Toe Second: Return Appointment in 2 weeks. Wound #16 Right,Plantar Toe Great: Return Appointment in 2 weeks. Wound #17 Right,Distal,Plantar Metatarsal head second: Return Appointment in 2 weeks. Return Appointment in 2 weeks. Wound #2 Right Toe Second: Return Appointment in 2 weeks. Edema Control: Wound #14 Left Toe Second: Patient to wear own compression stockings Wound #16 Right,Plantar Toe Great: Patient to wear own compression stockings Wound #17 Right,Distal,Plantar Metatarsal head  second: Patient to wear own compression stockings Wound #2 Right Toe Second: Patient to wear own compression stockings Additional Orders / Instructions: Wound #14 Left Toe Second: Stop Smoking Increase protein intake. Wound #16 Right,Plantar Toe Great: Stop Smoking Increase protein intake. Wound #17 Right,Distal,Plantar Metatarsal head second: Stop Smoking Increase protein intake. Wound #2 Right Toe Second: Stop Smoking Increase protein intake. My suggestion currently is gonna be that we go ahead and continue with the above wound care measures. I did have a discussion with the patient concerning the shoes and what type he wears which I think is going to be of utmost importance as far as getting this area to heal. He is in agreement with attempting to where the postop shoes most of the time. We will see were things stand at follow-up. Please see above for specific wound care orders. We will see patient for re-evaluation in 2 week(s) here in the clinic. If anything worsens or changes patient will contact our office for additional recommendations. Electronic Signature(s) Signed: 04/10/2018 11:59:27 PM By: Worthy Keeler PA-C Entered By: Worthy Keeler on 04/10/2018 10:57:58 Gerety, [name] ([id number]) -------------------------------------------------------------------------------- ROS/PFSH Details Patient Name: [name], [name] Date of Service: 04/10/2018 8:00 AM Medical Record Number: [id number] Patient Account Number: [id number] Date of Birth/Sex: Apr 15, 1950 (68 y.o. M) Treating RN: Harold Barban Primary Care Provider: Lamonte Sakai Other Clinician: Referring Provider: Lamonte Sakai Treating Provider/Extender: Melburn Hake, HOYT Weeks in Treatment: 48 Information Obtained From Patient Wound History Do you currently have one or more open woundso Yes How many open wounds do you currently haveo 6 Approximately how long have  you had your woundso 3 months How have you been treating your  wound(s) until nowo ointment and bandage Has your wound(s) ever healed and then re-openedo No Have you had any lab work done in the past montho No Have you tested positive for an antibiotic resistant organism (MRSA, VRE)o No Have you tested positive for osteomyelitis (bone infection)o No Have you had any tests for circulation on your legso Yes Who ordered the testo PCP Where was the test doneo [facility] Constitutional Symptoms (General Health) Complaints and Symptoms: Negative for: Fever; Chills Eyes Medical History: Negative for: Cataracts; Glaucoma; Optic Neuritis Ear/Nose/Mouth/Throat Medical History: Negative for: Chronic sinus problems/congestion; Middle ear problems Hematologic/Lymphatic Medical History: Positive for: Anemia; Lymphedema Negative for: Hemophilia; Human Immunodeficiency Virus; Sickle Cell Disease Respiratory Complaints and Symptoms: No Complaints or Symptoms Medical History: Negative for: Aspiration; Asthma; Chronic Obstructive Pulmonary Disease (COPD); Pneumothorax; Sleep Apnea; Tuberculosis Cardiovascular Complaints and Symptoms: No Complaints or Symptoms Wymore, Mart E. ([id number]) Medical History: Positive for: Congestive Heart Failure; Hypertension; Peripheral Venous Disease Negative for: Angina; Arrhythmia; Coronary Artery Disease; Deep Vein Thrombosis; Hypotension; Myocardial Infarction; Peripheral Arterial Disease; Phlebitis; Vasculitis Gastrointestinal Medical History: Negative for: Cirrhosis ; Colitis; Crohnos; Hepatitis A; Hepatitis B; Hepatitis C Endocrine Medical History: Positive for: Type II Diabetes Treated with: Oral agents Blood sugar tested every day: Yes Tested : QD Genitourinary Medical History: Negative for: End Stage Renal Disease Immunological Medical History: Negative for: Lupus Erythematosus; Raynaudos; Scleroderma Integumentary (Skin) Medical History: Negative for: History of Burn; History of pressure  wounds Musculoskeletal Medical History: Negative for: Gout; Rheumatoid Arthritis; Osteoarthritis; Osteomyelitis Neurologic Medical History: Positive for: Neuropathy Negative for: Dementia; Quadriplegia; Paraplegia; Seizure Disorder Oncologic Medical History: Past Medical History Notes: 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 ([id number]) Immunization Notes: up to date Implantable Devices Family and Social History Current every day smoker; Marital Status - Widowed; Alcohol Use: Daily - quit drinking about a week ago; Drug Use: No History; Caffeine Use: Moderate; Financial Concerns: No; Food, Clothing or Shelter Needs: No; Support System Lacking: No; Transportation Concerns: No; Advanced Directives: No; Patient does not want information on Advanced Directives Physician Affirmation I have reviewed and agree with the above information. Electronic Signature(s) Signed: 04/10/2018 3:53:38 PM By: Harold Barban Signed: 04/10/2018 11:59:27 PM By: Worthy Keeler PA-C Entered By: Worthy Keeler on 04/10/2018 10:57:01 Hounshell, [name] ([id number]) -------------------------------------------------------------------------------- SuperBill Details Patient Name: [name], [name] Date of Service: 04/10/2018 Medical Record Number: [id number] Patient Account Number: [id number] Date of Birth/Sex: [birth year] (68 y.o. M) Treating RN: Harold Barban Primary Care Provider: Lamonte Sakai Other Clinician: Referring Provider: Lamonte Sakai Treating Provider/Extender: Melburn Hake, HOYT Weeks in Treatment: 36 Diagnosis Coding ICD-10 Codes Code Description L97.521 Non-pressure chronic ulcer of other part of left foot limited to breakdown of skin L97.511 Non-pressure chronic ulcer of other part of right foot limited to breakdown of  skin E11.621 Type 2 diabetes mellitus with foot ulcer I87.323 Chronic venous hypertension (idiopathic) with inflammation of bilateral lower extremity I89.0 Lymphedema, not elsewhere classified F17.218 Nicotine dependence, cigarettes, 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 abuse 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, right ankle and foot Facility Procedures CPT4 Code: [id number] Description: 01655 - WOUND CARE VISIT-LEV 5 EST PT Modifier: Quantity: 1 Physician Procedures CPT4 Code Description: [id number] - WC PHYS LEVEL 4 - EST PT ICD-10 Diagnosis Description L97.521 Non-pressure chronic ulcer of  other part of left foot limited to L97.511 Non-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

## 2018-04-16 ENCOUNTER — Ambulatory Visit
Admission: RE | Admit: 2018-04-16 | Discharge: 2018-04-16 | Disposition: A | Discharge status: Home / Self Care | Payer: Medicare HMO | Source: Ambulatory Visit | Attending: Radiation Oncology | Admitting: Radiation Oncology

## 2018-04-16 ENCOUNTER — Other Ambulatory Visit: Payer: Self-pay

## 2018-04-16 ENCOUNTER — Encounter: Payer: Self-pay | Admitting: Radiation Oncology

## 2018-04-16 VITALS — BP 188/89 | HR 80 | Temp 96.0°F | Resp 16 | Wt 192.4 lb

## 2018-04-16 DIAGNOSIS — C44529 Squamous cell carcinoma of skin of other part of trunk: Secondary | ICD-10-CM | POA: Diagnosis present

## 2018-04-16 DIAGNOSIS — Z923 Personal history of irradiation: Secondary | ICD-10-CM | POA: Diagnosis not present

## 2018-04-16 NOTE — Progress Notes (Signed)
Radiation Oncology Follow up Note  Name: [name]   Date:   04/16/2018 MRN:  [id number] DOB: 04-Apr-1951    This 68 y.o. male presents to the clinic today for to year follow-up status postradiation therapy to his perianal region for squamous cell carcinoma the buttocks.  REFERRING PROVIDER: [name], MD  HPI: patient is a 68 year old male now out 2 years having completed radiation therapy to his buttocks for a renal squamous cell carcinoma. Seen today in routine follow-up he is doing fairly well. He had reexcision of basal cell carcinoma by Dr. Tollie Pizza back in September still has some granulation tissue in that area although it is healing well..he is currently under continued care of Dr. Tollie Pizza.  COMPLICATIONS OF TREATMENT: none  FOLLOW UP COMPLIANCE: keeps appointments   PHYSICAL EXAM:  BP (!) 188/89 (BP Location: Left Arm, Patient Position: Sitting)   Pulse 80   Temp (!) 96 F (35.6 C) (Tympanic)   Resp 16   Wt 192 lb 5.6 oz (87.2 kg)   BMI 26.09 kg/m  In the right buttocks there is an area of granulation tissue where the recent excision is occurred. No evidence of mass or residual disease is noted.Well-developed well-nourished patient in NAD. HEENT reveals PERLA, EOMI, discs not visualized.  Oral cavity is clear. No oral mucosal lesions are identified. Neck is clear without evidence of cervical or supraclavicular adenopathy. Lungs are clear to A&P. Cardiac examination is essentially unremarkable with regular rate and rhythm without murmur rub or thrill. Abdomen is benign with no organomegaly or masses noted. Motor sensory and DTR levels are equal and symmetric in the upper and lower extremities. Cranial nerves II through XII are grossly intact. Proprioception is intact. No peripheral adenopathy or edema is identified. No motor or sensory levels are noted. Crude visual fields are within normal range.  RADIOLOGY RESULTS: no current films for review  PLAN: present time patient  is slowly healing area slight granulation tissue in the right buttocks from recent excision of basal cell carcinoma. I'm please with the patient's overall progress. He continues close follow-up care with Dr. Tollie Pizza. I have asked to see him back in 1 year for follow-up. Patient is to call sooner with any concerns.  I would like to take this opportunity to thank you for allowing me to participate in the care of your patient.Noreene Filbert, MD

## 2018-04-21 ENCOUNTER — Inpatient Hospital Stay: Payer: Medicare HMO | Attending: Oncology | Admitting: Oncology

## 2018-04-21 ENCOUNTER — Inpatient Hospital Stay: Payer: Medicare HMO

## 2018-04-21 ENCOUNTER — Encounter: Payer: Self-pay | Admitting: Oncology

## 2018-04-21 VITALS — BP 153/74 | HR 80 | Temp 96.9°F | Wt 197.0 lb

## 2018-04-21 DIAGNOSIS — E1122 Type 2 diabetes mellitus with diabetic chronic kidney disease: Secondary | ICD-10-CM | POA: Insufficient documentation

## 2018-04-21 DIAGNOSIS — D631 Anemia in chronic kidney disease: Secondary | ICD-10-CM | POA: Diagnosis not present

## 2018-04-21 DIAGNOSIS — Z79899 Other long term (current) drug therapy: Secondary | ICD-10-CM | POA: Insufficient documentation

## 2018-04-21 DIAGNOSIS — N189 Chronic kidney disease, unspecified: Secondary | ICD-10-CM | POA: Insufficient documentation

## 2018-04-21 DIAGNOSIS — M7989 Other specified soft tissue disorders: Secondary | ICD-10-CM | POA: Insufficient documentation

## 2018-04-21 DIAGNOSIS — K2970 Gastritis, unspecified, without bleeding: Secondary | ICD-10-CM | POA: Insufficient documentation

## 2018-04-21 DIAGNOSIS — I130 Hypertensive heart and chronic kidney disease with heart failure and stage 1 through stage 4 chronic kidney disease, or unspecified chronic kidney disease: Secondary | ICD-10-CM | POA: Insufficient documentation

## 2018-04-21 DIAGNOSIS — D472 Monoclonal gammopathy: Secondary | ICD-10-CM | POA: Diagnosis not present

## 2018-04-21 DIAGNOSIS — D509 Iron deficiency anemia, unspecified: Secondary | ICD-10-CM

## 2018-04-21 DIAGNOSIS — K219 Gastro-esophageal reflux disease without esophagitis: Secondary | ICD-10-CM | POA: Diagnosis not present

## 2018-04-21 DIAGNOSIS — J449 Chronic obstructive pulmonary disease, unspecified: Secondary | ICD-10-CM | POA: Insufficient documentation

## 2018-04-21 DIAGNOSIS — F17210 Nicotine dependence, cigarettes, uncomplicated: Secondary | ICD-10-CM | POA: Insufficient documentation

## 2018-04-21 DIAGNOSIS — Z862 Personal history of diseases of the blood and blood-forming organs and certain disorders involving the immune mechanism: Secondary | ICD-10-CM

## 2018-04-21 DIAGNOSIS — I509 Heart failure, unspecified: Secondary | ICD-10-CM | POA: Diagnosis not present

## 2018-04-21 DIAGNOSIS — K7460 Unspecified cirrhosis of liver: Secondary | ICD-10-CM | POA: Diagnosis not present

## 2018-04-21 LAB — CBC WITH DIFFERENTIAL/PLATELET
Abs Immature Granulocytes: 0.05 10*3/uL (ref 0.00–0.07)
BASOS PCT: 1 %
Basophils Absolute: 0 10*3/uL (ref 0.0–0.1)
EOS PCT: 4 %
Eosinophils Absolute: 0.3 10*3/uL (ref 0.0–0.5)
HEMATOCRIT: 24.4 % — AB (ref 39.0–52.0)
Hemoglobin: 7.8 g/dL — ABNORMAL LOW (ref 13.0–17.0)
Immature Granulocytes: 1 %
LYMPHS ABS: 1 10*3/uL (ref 0.7–4.0)
Lymphocytes Relative: 15 %
MCH: 30.5 pg (ref 26.0–34.0)
MCHC: 32 g/dL (ref 30.0–36.0)
MCV: 95.3 fL (ref 80.0–100.0)
MONO ABS: 0.9 10*3/uL (ref 0.1–1.0)
MONOS PCT: 13 %
NEUTROS PCT: 66 %
Neutro Abs: 4.7 10*3/uL (ref 1.7–7.7)
PLATELETS: 220 10*3/uL (ref 150–400)
RBC: 2.56 MIL/uL — ABNORMAL LOW (ref 4.22–5.81)
RDW: 12.9 % (ref 11.5–15.5)
WBC: 7 10*3/uL (ref 4.0–10.5)
nRBC: 0 % (ref 0.0–0.2)

## 2018-04-21 LAB — SAMPLE TO BLOOD BANK

## 2018-04-21 MED ORDER — EPOETIN ALFA-EPBX 40000 UNIT/ML IJ SOLN: 50000.0000 [IU] | Freq: Once | INTRAMUSCULAR | Status: DC

## 2018-04-21 MED ORDER — EPOETIN ALFA-EPBX 10000 UNIT/ML IJ SOLN
10000.0000 [IU] | Freq: Once | INTRAMUSCULAR | Status: AC
  Administered 2018-04-21: 10000 [IU] via SUBCUTANEOUS
  Filled 2018-04-21: qty 1

## 2018-04-21 MED ORDER — EPOETIN ALFA-EPBX 40000 UNIT/ML IJ SOLN
40000.0000 [IU] | Freq: Once | INTRAMUSCULAR | Status: AC
  Administered 2018-04-21: 40000 [IU] via SUBCUTANEOUS
  Filled 2018-04-21: qty 1

## 2018-04-21 NOTE — Progress Notes (Signed)
Hematology/Oncology Consult note [facility]  Telephone:([phone] [phone]  Patient Care Team: [name], MD as PCP - General (Internal Medicine) Sindy Guadeloupe, MD as Consulting Physician (Hematology and Oncology)   Name of the patient: [name]  [id number]  05/17/1950   Date of visit: 04/21/18  Diagnosis- 1.Anemia- multifactorial- iron deficiency, chronic disease and anemia of kidney disease  2. 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. 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 Baruch Gouty. Patient had CT abdomen in July 2016 which showed retroperitoneal, celiac and gastric adenopathy. MR pelvis in Oct 2017 showed b/l inguinal and external iliac adenopathy as well. Inguinal LN biopsy was negative for malignancy.   2. Biopsy of the skin lesion showed: DIAGNOSIS:  A. PERIANAL POLYP, LEFT; EXCISION:  - SKIN WITH POLYPOID VASCULAR PROLIFERATION WITH SUPERFICIAL NECROSIS,  SEE COMMENT.   B. PERIANAL MASS, RIGHT; INCISIONAL BIOPSY:  - INVASIVE CARCINOMA WITH BASALOID FEATURES.  - SEE COMMENT.   Comment #1:  The differential diagnosis for the perianal polyp includes pyogenic  granuloma and ulcerated hemangioma.   Comment #2:  Immunohistochemical stains were performed. The carcinoma is positive for  p16, BerEp4, and BCL-2. Stain controls worked appropriately.  Unfortunately the pattern of staining does not assist with further  classification of the carcinoma. Clinical correlation is required to  determine if the lesion involves perianal skin primarily (BCC) or is  arising in the anal canal (squamous cell carcinoma with basaloid  features).   This was followed by RT to the lesion.  3. He had repeat CT abdomen in feb  2018 which showed: IMPRESSION: Progressive hepatic cirrhosis since prior study. No evidence of hepatic neoplasm.  New moderate ascites, diffuse mesenteric and body wall edema, and small bilateral pleural effusions. Mild diffuse small bowel wall thickening, consistent with hypoalbuminemia.  Findings consistent with portal venous hypertension, including mild splenomegaly and upper abdominal venous collaterals.  Stable mild abdominal lymphadenopathy. Mild increase in bilateral iliac and inguinal lymphadenopathy in the pelvis, which may be reactive in etiology, with metastatic disease considered less Likely.  4. Heunderwentegd and colonoscopy for his microcytic anemiawhich showed no overt bleeding lesions or malignancy  5. CBC on 09/18/2016 showed H&H of 6.5/19.8 and MCV of 86.7. Labs suggestive of iron deficiency anemia. B12 and folate was within normal limits. Multiple myeloma panel showed IGG monoclonal protein of 0.9 gm. Polyclonal gammopathyHaptoglobin and no copper was normal. Reticulocyte count was mildly elevated at 7.9. Patient received 2 doses of ferriheme on 620 and 10/03/2016. He also received 1 unit of blood transfusion  6. EGD showed a normal duodenum and normal esophagus. Found to have gastritis which was biopsied. Also found to have a large polypoid mass in the left piriform sinus and nonobstructing the airway. This was subsequently evaluated by ENT which did not find any evidence of mass. The colonoscopy showed appendixthat was appearing protruding and ordered this blood with stool. No mucus seen in the vicinity. He was evaluated by a [facility] and has been considered for a repeat colonoscopy versus endoscopy mucosal resection of the mucocele  7.Bonemarrowbiopsy showed hypercellular marrow for age. 9% plasma cells. Clonality of cells could not be evaluated because of lack of clot sections available for staining.  Cytogenetics were normal. FISH studies were  negative for any chromosomal abnormalities as well.  8. Patient received procrit for anemia of chronic kidney disease with improvement of his anemia to 10   Interval history-he reports right lower extremity swelling that has happened since the last 1 day.  His appetite is good otherwise and denies any unintentional weight loss.  Denies any new aches and pains anywhere  ECOG PS- 1 Pain scale- 0 Opioid associated constipation- no  Review of systems- Review of Systems  Constitutional: Positive for malaise/fatigue.  Cardiovascular: Positive for leg swelling.      No Known Allergies   Past Medical History:  Diagnosis Date  . Anemia   . Cancer ([place]) 11/02/2014   PERIANAL MASS, RIGHT; INCISIONAL BIOPSY: INVASIVE CARCINOMA WITH BASALOID FEATURES  . CHF (congestive heart failure) ([place])   . Chronic kidney disease   . COPD (chronic obstructive pulmonary disease) ([place])    NO INHALERS  . Diabetes mellitus    NO MEDS  . GERD (gastroesophageal reflux disease)   . Hepatic cirrhosis ([place])   . Hypertension   . Leg swelling   . Loose, teeth    PATIENT STATES "HAS 6 TEETH, SOME ARE LOOSE"  . Neuromuscular disorder ([place])    RIGHT HAND AND FINGERS NUMB  . Shortness of breath dyspnea    WITH EXERTION   . 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 02/24/2016     Past Surgical History:  Procedure Laterality Date  . APPENDECTOMY    . COLONOSCOPY WITH PROPOFOL N/A 11/19/2014   Procedure: COLONOSCOPY WITH PROPOFOL;  Surgeon: Lucilla Lame, MD;  Location: [facility];  Service: Endoscopy;  Laterality: N/A;  DIABETIC-ORAL MEDS  . COLONOSCOPY WITH PROPOFOL N/A 10/04/2016   Procedure: COLONOSCOPY WITH PROPOFOL;  Surgeon: Jonathon Bellows, MD;  Location: [facility] ENDOSCOPY;  Service: Endoscopy;  Laterality: N/A;  . ESOPHAGOGASTRODUODENOSCOPY (EGD) WITH PROPOFOL N/A 10/04/2016   Procedure: ESOPHAGOGASTRODUODENOSCOPY (EGD) WITH PROPOFOL;  Surgeon: Jonathon Bellows, MD;  Location[facility] ENDOSCOPY;  Service:  Endoscopy;  Laterality: N/A;  . GIVENS CAPSULE STUDY N/A 11/13/2016   Procedure: GIVENS CAPSULE STUDY;  Surgeon: Jonathon Bellows, MD;  Location: [facility] ENDOSCOPY;  Service: Gastroenterology;  Laterality: N/A;  . INCISION AND DRAINAGE PERIRECTAL ABSCESS Right 12/16/2017   Procedure: EXCISION BASAL CELL CANCER/ GLUTEAL ABSCESS AND FLAP COVERAGE;  Surgeon: Robert Bellow, MD;  Location: [facility] ORS;  Service: General;  Laterality: Right;  . INGUINAL LYMPH NODE BIOPSY Right 01/26/2016   Procedure: INGUINAL LYMPH NODE BIOPSY;  Surgeon: Clayburn Pert, MD;  Location: [facility] ORS;  Service: General;  Laterality: Right;  . RECTAL BIOPSY N/A 11/02/2014   Procedure: BIOPSY RECTAL;  Surgeon: Marlyce Huge, MD;  Location: [facility] ORS;  Service: General;  Laterality: N/A;  . RECTAL EXAM UNDER ANESTHESIA N/A 11/02/2014   Procedure: RECTAL EXAM UNDER ANESTHESIA;  Surgeon: Marlyce Huge, MD;  Location: [facility] ORS;  Service: General;  Laterality: N/A;  . TONSILLECTOMY      Social History   Socioeconomic History  . Marital status: Divorced    Spouse name: Not on file  . Number of children: Not on file  . Years of education: Not on file  . Highest education level: Not on file  Occupational History  . Not on file  Social Needs  . Financial resource strain: Somewhat hard  . Food insecurity:    Worry: Patient refused    Inability: Patient refused  . Transportation needs:    Medical: Patient refused    Non-medical:  Patient refused  Tobacco Use  . Smoking status: Current Every Day Smoker    Packs/day: 0.25    Years: 20.00    Pack years: 5.00    Types: Cigarettes  . Smokeless tobacco: Never Used  . Tobacco comment: 3-4 cigarettes per day  Substance and Sexual Activity  . Alcohol use: Not Currently  . Drug use: Not Currently    Comment: Last Use 2016 per patient-+ UDS FOR COCAINE IN 2014  . Sexual activity: Never  Lifestyle  . Physical activity:    Days per week: 0 days    Minutes per session: 0  min  . Stress: To some extent  Relationships  . Social connections:    Talks on phone: More than three times a week    Gets together: More than three times a week    Attends religious service: More than 4 times per year    Active member of club or organization: Yes    Attends meetings of clubs or organizations: More than 4 times per year    Relationship status: Divorced  . Intimate partner violence:    Fear of current or ex partner: Patient refused    Emotionally abused: Patient refused    Physically abused: Patient refused    Forced sexual activity: Patient refused  Other Topics Concern  . Not on file  Social History Narrative  . Not on file    Family History  Problem Relation Age of Onset  . Asthma Mother   . Colon cancer Neg Hx      Current Outpatient Medications:  .  amLODipine (NORVASC) 10 MG tablet, Take 10 mg by mouth 2 (two) times daily. , Disp: , Rfl:  .  atorvastatin (LIPITOR) 40 MG tablet, Take 40 mg by mouth every morning. , Disp: , Rfl:  .  bismuth subsalicylate (PEPTO BISMOL) 262 MG/15ML suspension, Take 30 mLs by mouth every 6 (six) hours as needed for indigestion or diarrhea or loose stools. , Disp: , Rfl:  .  docusate sodium (COLACE) 100 MG capsule, Take 1 capsule (100 mg total) by mouth 2 (two) times daily., Disp: 10 capsule, Rfl: 0 .  ferrous sulfate 325 (65 FE) MG tablet, Take 325 mg by mouth daily. , Disp: , Rfl:  .  hydrALAZINE (APRESOLINE) 25 MG tablet, Take 25 mg 2 (two) times daily by mouth., Disp: , Rfl:  .  omeprazole (PRILOSEC) 40 MG capsule, Take 40 mg by mouth every morning. , Disp: , Rfl:  .  polyethylene glycol (MIRALAX / GLYCOLAX) packet, Take 17 g by mouth daily as needed for mild constipation. , Disp: , Rfl:  .  silver sulfADIAZINE (SILVADENE) 1 % cream, Apply to affected area twice daily, Disp: 400 g, Rfl: 1 .  sodium bicarbonate 650 MG tablet, Take 1 tablet (650 mg total) by mouth 3 (three) times daily., Disp: 20 tablet, Rfl: 0 .  vitamin  B-12 (CYANOCOBALAMIN) 1000 MCG tablet, Take 1 tablet (1,000 mcg total) by mouth daily., Disp: 30 tablet, Rfl: 3 .  Vitamin D, Ergocalciferol, (DRISDOL) 50000 units CAPS capsule, Take 50,000 Units by mouth every Tuesday. , Disp: , Rfl:  No current facility-administered medications for this visit.   Facility-Administered Medications Ordered in Other Visits:  .  epoetin alfa (EPOGEN,PROCRIT) injection 40,000 Units, 40,000 Units, Subcutaneous, Weekly, Sindy Guadeloupe, MD, 40,000 Units at 05/23/17 1145  Physical exam:  Vitals:   04/21/18 1213 04/21/18 1219  BP: (!) 170/78 (!) 153/74  Pulse: 80   Temp: Marland Kitchen)  96.9 F (36.1 C)   TempSrc: Tympanic   Weight: 197 lb (89.4 kg)    Physical Exam Constitutional:      General: He is not in acute distress. HENT:     Head: Normocephalic and atraumatic.  Eyes:     Pupils: Pupils are equal, round, and reactive to light.  Neck:     Musculoskeletal: Normal range of motion.  Cardiovascular:     Rate and Rhythm: Normal rate and regular rhythm.     Heart sounds: Normal heart sounds.  Pulmonary:     Effort: Pulmonary effort is normal.     Breath sounds: Normal breath sounds.  Abdominal:     General: Bowel sounds are normal. There is no distension.     Palpations: Abdomen is soft.     Tenderness: There is no abdominal tenderness.  Musculoskeletal:     Right lower leg: Edema present.  Skin:    General: Skin is warm and dry.  Neurological:     Mental Status: He is alert and oriented to person, place, and time.      CMP Latest Ref Rng & Units 01/24/2018  Glucose 70 - 99 mg/dL 134(H)  BUN 8 - 23 mg/dL 68(H)  Creatinine 0.61 - 1.24 mg/dL 3.28(H)  Sodium 135 - 145 mmol/L 135  Potassium 3.5 - 5.1 mmol/L 4.9  Chloride 98 - 111 mmol/L 110  CO2 22 - 32 mmol/L 19(L)  Calcium 8.9 - 10.3 mg/dL 8.3(L)  Total Protein 6.5 - 8.1 g/dL -  Total Bilirubin 0.3 - 1.2 mg/dL -  Alkaline Phos 38 - 126 U/L -  AST 15 - 41 U/L -  ALT 0 - 44 U/L -   CBC Latest Ref  Rng & Units 04/21/2018  WBC 4.0 - 10.5 K/uL 7.0  Hemoglobin 13.0 - 17.0 g/dL 7.8(L)  Hematocrit 39.0 - 52.0 % 24.4(L)  Platelets 150 - 400 K/uL 220      Assessment and plan- Patient is a 68 y.o. male with anemia of chronic kidney disease and MGUS  Patient has not received Retacrit since September 2019 because of hospitalization as well as uncontrolled blood pressure.  He will get his 50,000 units of Retacrit today.  He will come back in 1 month's time for the next dose of Retacrit and I will see him back in 2 months with CBC ferritin and iron studies.  Right lower extremity swelling greater than left.  He will get right lower extremity Doppler today  MGUS: Labs were stable in October 2019.  We will repeat labs in 3 months time   Visit Diagnosis 1. Right leg swelling   2. History of anemia due to chronic kidney disease      Dr. Randa Evens, MD, MPH [facility] [phone] 04/21/2018 1:21 PM

## 2018-04-21 NOTE — Progress Notes (Signed)
Anemia follow up. No visible signs of bleeding. Appetite is good. Fatigued. Intermittent tingling noticed in R arm.

## 2018-04-22 ENCOUNTER — Ambulatory Visit: Payer: Medicare HMO

## 2018-04-22 ENCOUNTER — Telehealth: Payer: Self-pay | Admitting: *Deleted

## 2018-04-22 NOTE — Telephone Encounter (Signed)
Got a call today that patient did not show for his ultrasound.  Called scheduling and made a new appointment for him for Thursday, January 16 appointment at time is 5 PM.  Patient will need to arrive at 445.  Called his cousin who is the person who drives him name Theodoro Doing.  Carolyn Stare that patient did not come for his ultrasound.  Theodoro Doing did not know about it.  Told him the above I am rescheduled the appointment for Thursday 5:00 at [place] is fine to take him he will call his cousin and see if he would like to go and if the patient does not want to go to it he will call me back

## 2018-04-22 NOTE — Telephone Encounter (Signed)
[place] department called to report that patient did NOT show up for his doppler today

## 2018-04-22 NOTE — Telephone Encounter (Signed)
I called cousin and r/s the u/d for 1/16 and cousin will take him andif pt chooses not [place] he will call me back so I can cancel test

## 2018-04-22 NOTE — Telephone Encounter (Signed)
We will call patient but he sat here yest. And they did not have room yest. So we asked if he would make it today and then he did not come to the appt

## 2018-04-22 NOTE — Telephone Encounter (Signed)
Can we call him and see if he wants to get it done sometime this week

## 2018-04-24 ENCOUNTER — Ambulatory Visit: Admission: RE | Admit: 2018-04-24 | Payer: Medicare HMO | Source: Ambulatory Visit

## 2018-04-24 ENCOUNTER — Encounter: Payer: Medicare HMO | Admitting: Physician Assistant

## 2018-04-24 DIAGNOSIS — E11621 Type 2 diabetes mellitus with foot ulcer: Secondary | ICD-10-CM | POA: Diagnosis not present

## 2018-04-26 NOTE — Progress Notes (Signed)
PAXTEN, APPELT ([id number]) Visit Report for 04/24/2018 Chief Complaint Document Details Patient Name: [name], [name] Date of Service: 04/24/2018 8:00 AM Medical Record Number: [id number] Patient Account Number: [id number] Date of Birth/Sex: 06-19-1950 (68 y.o. M) Treating RN: Harold Barban Primary Care Provider: Lamonte Sakai Other Clinician: Referring Provider: Lamonte Sakai Treating Provider/Extender: Melburn Hake, HOYT Weeks in Treatment: 90 Information Obtained from: Patient Chief Complaint Patient presents for treatment of an open diabetic ulcer to both feet Electronic Signature(s) Signed: 04/24/2018 5:12:14 PM By: Worthy Keeler PA-C Entered By: Worthy Keeler on 04/24/2018 08:16:17 [place], [name] ([id number]) -------------------------------------------------------------------------------- HPI Details Patient Name: [name], [name] Date of Service: 04/24/2018 8:00 AM Medical Record Number: [id number] Patient Account Number: [id number] Date of Birth/Sex: 1950/12/22 (68 y.o. M) Treating RN: Harold Barban Primary Care Provider: Lamonte Sakai Other Clinician: Referring Provider: Lamonte Sakai Treating Provider/Extender: Melburn Hake, HOYT Weeks in Treatment: 14 History of Present Illness HPI Description: 68 year old patient here to see as for bilateral feet ulceration to on his left first and second toe and 2 on his right first and second toe, which she's had for about 4 months. 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 Donella Stade. The patient is also undergoing workup by medical oncology for a intra-abdominal lymphadenopathy. 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 He is also status post appendectomy, inguinal lymph node biopsy, rectal biopsy  and rectal examination under anesthesia. he currently smokes cigarettes about half packet a day. In March of this year he was seen by Dr. Hortencia Pilar, for evaluation of bilateral varicose veins and besides wearing compression stockings he had recommended laser ablation of the right and left great saphenous veins to eleviate the symptoms and complications of severe superficial venous reflux disease. He also recommended lymphedema pumps for better control of his lymphedema. The patient recently has had on 08/23/2016, right greater saphenous vein ablation with the laser energy Earlier lower extremity venous reflux examination done on 05/08/2016 showed no DVT or SVT both lower legs but incompetence of bilateral great saphenous veins was present. A lower arterial study was also done and there was no significant right lower and left lower extremity problems based on a normal toe brachial index bilaterally and the ABI was 1.21 the left and 1.23 on the right. His post ablation venous duplex examination showed successful ablation of the right GS vein with thrombus formation 2 below the right saphenofemoral junction. The deep system was patent without evidence of thrombosis and this was done on 08/30/2016. 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 Addendum: regarding his x-rays done today and x-ray of the left foot -- IMPRESSION: No objective evidence of osteomyelitis. There are soft tissue changes which may reflect cellulitis. X-ray of the right foot -- IMPRESSION:Findings compatible with cellulitis of the toes. No objective evidence of osteomyelitis is observed. 10/01/16 on evaluation today patient's wounds appeared to be doing some better. I did review the x-rays as well which showed no evidence of osteomyelitis although there was evidence on x-ray of cellulitis. He fortunately is not  having any discomfort although he continues to have some swelling. He does not remember being on any antibiotics  recently. 10/15/16 on evaluation today patient's wounds overall appear to be doing better although he does have a new location noted on the left foot. Fortunately he is not having significant pain. It almost has the appearance that something is rubbing on the end of his toes but he wears the open toe shoes and according to what he is telling me never wears anything that would rub on his foot. There is no evidence of infection and specifically no evidence of a fungal infection 10/22/16 On evaluation today patient's wounds appeared to be doing better compared to last week in regard to his bilateral lower extremities. Fortunately I happy with how things are progressing although he still has ulcers I feel like that he is improving and appropriate manner. 11/12/16 on evaluation today patient appears to be doing well in regard to his bilateral feet and the respective wounds. We have been using surrounding her dressings along with an antifungal cream which seems to be doing very well. He has no bilateral dysfunction noticed that the rituals are weight loss at this point. He also has no nausea or vomiting a note purulent discharge. He did see Vein and vascular today and he tells me that they told him he could have surgery for his venous stasis but they did not feel like it was worth it in his words. Fortunately patient's wounds do appear to be getting sneakily better. [name], [name] ([id number]) 11/26/2016 -- he says he is going to have some surgery during this week at [facility] for possibly a colon resection. 12/31/2016 -- the patient has been noncompliant with his smoking and I'm not sure whether he is also started drinking again. He continues to be very nonchalant about his care 01/14/2017 -- the patient's HandP has been reviewed well and I understand he is being compliant with trying to give  up smoking and his local dressing changes. He does not have any surgical options of 4 to him by his vascular surgeons.he was last seen in early August by Dr. Hortencia Pilar who recommended compression stockings,and possibly lymph pumps in 2-3 months after doing a review ultrasound. 01/28/2017 - the patient did not have any fresh complaints but on examination I noted a large lacerated wound on the plantar aspect of his right fourth toe which had a lot of necrotic debris and it probes down to bone. 02/07/2017 -- x-ray of the right foot -- IMPRESSION: Soft tissue swelling about the first through fourth toes consistent with cellulitis. New destructive change in the tuft of the distal phalanx of the great toe is consistent with osteomyelitis. 02/14/2017 -- the patient's MRI is pending this coming Monday and he still continues to smoke. We have again gone over off loading of his wounds in great detail and he says he's been compliant. 02/21/2017 -- MR of the right foot -- IMPRESSION: 1. Soft tissue ulcer at the tip of the first, second and third toe knows. Cortical irregularity and bone marrow edema in the first distal phalanx most concerning for osteomyelitis. Mild marrow edema in the second and third distal phalanx without definite cortical destruction which may reflect early osteomyelitis versus reactive marrow edema. 2. Soft tissue edema surrounding the first phalanx most consistent with cellulitis. the patient was also recently evaluated by his medical oncologist Dr. 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 She is treating him with weekly Procrit. She is also keeping intra-abdominal lymphadenopathy and right lower  lobe lung nodule under observation. 04/04/2017 -- he was seen by Dr. Adrian Prows on 03/25/2017 -- after review he empirically put him on ciprofloxacin and doxycycline as they have good bone penetration and good  bioavailability and it will cover the usual pathogens and diabetic foot osteomyelitis. He will check inflammatory markers and plan a 67-89 week old records. C-reactive protein was 0.3 and the ESR was 72 04/18/17 on evaluation today patient appears to be doing about the same in regard to his lower extremity wounds bilaterally. He has continued to use the antifungal cream which does seem to be beneficial. Nonetheless the ulcers do seem to in some areas be epithelial eyes over and in other areas are still open. He is having no significant discomfort. 04/25/17-he is here in follow-up evaluation for multiple ulcerations to multiple toes bilaterally. He states he did see Dr. Ola Spurr again last week and continues antibiotic therapy. He is voicing no complaints or concerns, will continue with current treatment plan will possibility of adding compression therapy next week after an additional week of treatment/lotions to BLE prescribed by Dr Ola Spurr 05/02/17 he is here in follow up for for multiple ulcers to multiple toes bilaterally. we will stop using antifungal cream and will continue with silvercel and follow up next week 05/09/17-he is here in follow-up for multiple ulcerations to multiple toes bilaterally. There is improvement in appearance. He has not completely stopped using antifungal cream, but admits he has not using it between the toes. He has an appointment with Dr. Ola Spurr on 2/11, continues on doxycycline and Cipro. It has been 5 weeks of antibiotic therapy, we will order plain film xray to evaluate for osteomyelitis next week, prior to follow up with ID. Will continue with silvercel and follow up next week 05/16/17-he is here in follow-up evaluation for multiple ulcerations to multiple toes bilaterally and new wound to the right posterior heel. There is essentially no change in appearance, deteriorating measurements; he has a history of waxing and waning measurements. He admits that he  continues to apply moisturizer/cream/ointment to his toes despite weekly reminders to only apply silvercel to his toes. He states that he thinks the surgical shoe contributed to the superficial ulcer to his posterior heel, he is unable to articulate if this was an area of dry cracked skin as he has a similar area to the left heel. He now is wearing open toed slippers. He has an appointment with Dr. Ola Spurr on 2/11. We have ordered x-rays for her bilateral feet; he was advised to obtain the x-rays today or tomorrow. He will follow-up next week 05/23/17-he is here in follow-up evaluation for multiple ulcerations to multiple toes bilaterally and the right posterior heel. There is improvement in maceration. He has been compliant and not applying any moisturizing agent to his toes. He has been using Lac-Hydrin for his lower extremities with improvement. He did not go to his appointment on Monday with Dr. Ola Spurr secondary to financial concerns. X-rays for her bilateral feet showed: LEFT FOOT with slight erosion of the tuft of the distal phalanges of the left first and second toe suspicious for osteomyelitis, RIGHT FOOT with 1.erosion of the tufts of the distal Opara, [name] ([id number]) phalanges of the right first second and possibly third toes consistent with osteomyelitis, 2 no definitive abnormality of the calcaneus is seen on the images obtained, 3. Plantar calcaneal degenerative spur. We briefly discussed hyperbaric adjunctive therapy for treatment of chronic refractory osteomyelitis. I do not find an a1c in  EMR, will contact PCP for record, or order if needed. He has been encouraged to contact Dr Ola Spurr office regarding the follow-up appointment, encouraged him to inquire about payment plan. We will continue with same treatment plan and follow-up next week. He states he is still taking antibiotics and has "a lot" left. He states he has been taking them as directed, 2 pills twice daily.  According to Dr. Blane Ohara office notes he was originally started on 12/17 for 4 weeks and extended on 1/14 for an additional 4 weeks. He should be done with his antibiotic therapy, he was advised to bring his bottles and to his next appointment, we will contact pharmacy. 05/30/17-he is here in follow-up evaluation for multiple ulcerations to multiple toes bilaterally and the right posterior heel. He is accompanied by his brother-in-law. Wounds are stable. He has yet to make up with Dr. Ola Spurr. We contacted his PCP, with no record of recent A1c we will draw an A1c. His brother-in-law states that he was taken off all of his diabetic medication secondary to kidney function. 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 The antibiotics were counted and have approximately 2 weeks left, although they should be complete. The pill organizer reveals missing days. We discussed the need for consistent medications, to have optimal benefit of medication. He has a cousin that lives with him and he will ask her to check his organizer daily. He has been advised to follow up with Dr Ola Spurr, and will go by the office today. He has been advised to quit smoking. 06/06/17-he is here in follow up evaluation. He has had to make an appointment with Dr. Ola Spurr. He did have blood work obtained, a1c 5. He continues to take antibiotic therapy. Significant improvement in bilateral lower extremity edema with compression therapy. Essentially no change in ulcerations to toes. He states he is "going to try something different" and "let me know next week" if it works; he would not provide any additional information and was encouraged to follow our orders. We will follow up next week 06/13/17-he is here in follow-up evaluation. He has an appointment with Dr. Ola Spurr tomorrow morning. He states he purchased an ointment from  [facility] and applied to his toes for 3 days, he does not remember the name of the ointment. There is improvement to his wounds, minimal maceration. He continues to take antibiotic therapy, this should have been completed last month. His brother-in-law who regularly accompanies his appointments was asked to take the bottles to the appointment tomorrow with Dr. Ola Spurr so he is aware. We will continue with 3 layer compression, and order OPEN TOE compression 20-30mmHg; we will apply compression stockings next week. He continues to smoke, smoked "2 cigarettes" last week 06/20/17 on evaluation today patient did receive his compression stockings which he has with him today for both lower extremities. With that being said he tells me at this point in time that he is very happy to have these he really is not a big fan of the compression wraps that we have been utilizing although they have been of great benefit for him. Nonetheless at this point he does want to switch to the compression stockings. In my opinion as long as he is continuing with compression I'm okay with the stockings or the wraps. 06/27/17-he is here in follow-up evaluation for multiple ulcerations to his bilateral toes. There is some improvement in appearance. He is compliant  in wearing his compression stockings with significant improvement in lower extremity edema. He saw Dr Ola Spurr on 3/8, per his notes they would redraw ESR and CRP; plan to continue antibiotic therapy if these remain elevated. I do not see an ESR or CRP level in Epic. The patient continues to take antibiotics. 07/11/17-He is here in follow-up evaluation for multiple ulcerations to multiple toes bilaterally. He presents with complete epithelialization to the right third toe; there has been no deterioration. He continues on antibiotic therapy. He will follow-up next week 07/18/17-He is here in follow-up evaluation for multiple ulcerations to multiple toes bilaterally. He  continues to make improvement. He continues on antibiotic therapy. He states he has been using something additional to our orders, he does not elaborate but states he will bring it in next week. 07/25/17-He is here in follow up evaluation for multiple ulcerations to bilateral toes. He is stable. He has completed antibiotic therapy. He admits to "filing" his toes after showers each evening, this is what he was referencing last week; he does not filing for the wounds. We will switch to [facility] and monitor for any improvement, he will follow-up next week 08/01/17-He is here in follow-up evaluation. He admits to "picking" at his toes after cleansing yesterday, leading to new areas of tissue loss on the bilateral second toe. There is improvement noted to the bilateral great toe. We will dress toes today and hope that they maintain until Monday where he will come in for a nurse visit. He has been advised, multiple times with expressed verbalization, to change the dressings to silvercel if the dressings get wet prior to [facility] appointment. 08/08/17-He is here in follow-up evaluation for bilateral first and second toe ulcerations. There is significant improvement to all ulcerations since last visit. We will switch to [name] to all wounds and he will continue with nurse visits on a Monday/Thursday schedule and follow-up with me in 2 weeks. He continues to smoke, 1-3 cigarettes per day, and has been encouraged to not smoke until his next follow-up in 2 weeks. 08/15/17 on evaluation today patient's ulcers on his toes actually appear to be doing fairly well the right to ulcers may be a little bit more moist compared to the left I'm not really sure exactly why as the openings appear to be very small and I'm not seeing any evidence of anything significant as far as you lightest or otherwise. Nonetheless this may just be a small setback he's [name], [name] ([id number]) been doing very well with the  [facility] Dressing 08/22/17-He is here in follow-up evaluation for ulcerations to his bilateral first and second toes, there is small/scant amount of drainage noted on today's dressing. He continues with Hydrofera Blue. He continues to smoke, 1 cigarette a day. Voices no complaint or concerns, compliant and compression therapy today. He'll follow-up next week 08/29/17-He is here in follow-up evaluation for ulcerations to his bilateral first and second toes, with a new wound to the left third toe. He states he cut himself while cutting the toenail. He presents today with more maceration and increased measurements; waxing and waning measurements and moisture has been an ongoing issue. He continues to smoke a proximally 1 cigarette per day, but states he has not smoked since Sunday. We will initiate medihoney daily and evaluate next week. He presents today without compression stockings 09/05/17-He is here in follow-up evaluation for ulcerations to bilateral first and second and left third toe. He did not pick up the  medihoney and therefore has not been changing his dressing. He admits to inconsistent where of compression stockings, admits to pain to his bilateral lower extremities with unilateral edema (right greater than left). The ulcerations to multiple toes are more macerated and larger in size than last week. He is wearing compression therapy today. He admits to an overall feeling of weakness and fatigue and has been encouraged to contact his PCP for evaluation; he has a known history of anemia and intraabdominal lymphadenopathy. We will return to silvercel and he will follow up next week 09/19/17-He is here in follow-up evaluation for ulcerations to bilateral first and second toe.he was unable to make last week's appointment. The culture that was obtained on 5/30 grew clindamycin sensitive MRSA; he was initiated on 6/4 but did not start it until 6/7. There is significant improvement in all  wounds, healing to left second and third toe. He also admits to having no alcohol or smoking for the last 6 days. We will continue with same treatment plan I will extend the clindamycin for an additional 10 days and he will follow-up next week. 09/26/17- He is here in follow evaluation for multiple ulcerations to multiple toes bilaterally. He continues to be non-compliant with dressing, compression therapy. He states he is taking the antibiotics as prescribed, although his recall is inconsistent. There is noted deterioration in all ulcers. We will continue same treatment plan, antibiotic therapy and he will follow up next week. 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 This information is obtained from the medical record, as he cannot articulate specifics to his diagnosis or treatment plan. 10/17/17-He is here in follow-up evaluation for multiple ulcerations to multiple toes bilaterally. There is chronic waxing and waning improvement/deterioration. On today's exam they appear improved, dry without maceration. He continues to apply a variety of topical agents, stating that he has switched between the blue product and silver product. In my opinion, this will be a chronic waxing and waning giving his intermittent compliance. He will follow-up in 2 weeks. As it relates to the biopsy taken from his buttock, he has not followed up with that and has not started radiation therapy. 10/31/17-He is here for evaluation for multiple ulcerations to multiple toes bilaterally. Continues with waxing and waning improvement versus deterioration. They are stable on today's exam without significant maceration. 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 We will continue with every 2 week follow-up appointments 11/14/17- He  is seen in follow-up evaluation for multiple ulcerations to multiple bilateral toes. He is scheduled for excision of basal cell carcinoma to his right buttock on Monday 8/12. He continues to have chronic waxing and waning of his ulcers; today is a stable day with minimal drainage and left ear is better than right toes. I will recount to infectious disease regarding chronic suppressive therapy. He admits to being more compliant with compression stockings, although he is not wearing any today. He has been encouraged to continue with dressing changes as ordered and compression therapy daily. He will be seen in 2 weeks 11/28/17-He is seen in follow up evaluation for multiple ulcerations to multiple toes bilaterally. He had an overnight admission at [facility] (8/11-8/12) for symptomatic anemia, received two units prbc. His surgical date has been moved to 9/94 excision of basal cell carcinoma to  the right buttock. He has been noncompliant in wearing compression stockings, applying topical treatment as ordered; he has been applying a "cream", Hydrofera Blue, silvercel. He is currently on no antibiotic therapy; we will send for re-referral to ID for suppressive therapy given his multiple co-morbidities, poor surgical candidate, unwilling to have toe amputation and general non-adherence. 12/19/17 on evaluation today patient actually appears to be doing very well in regard to his toes at least compared to last time I saw him. He again has been coming to our office for quite a bit of time. 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 He states is hurting a little bit but doing well in general. I'm see were not taking care of this area dermatology is. He does have his appointment with infectious disease upcoming which they re-consult for suppressive therapy that's next Monday. 01/02/18 on evaluation  today patient actually appears to be doing rather well in regard to his toe ulcers although he tells me he has been put in an ointment on this which I am concerned may be causing some additional moisture buildup which we really do not want. He supposed be using silver cell which is mainly thing I want him to be utilizing. Fortunately there does not appear to be any evidence of worsening infection which is good news. He has had surgery on his gluteal region where he had an excision of a cancerous area fortunately that seems to be doing well although he is having some discomfort. 02/06/18 on evaluation today patient actually appears to be doing well at all locations except for his second toe use bilaterally [name], [name] ([id number]) where the distal tip is still wrong open. He did state that he can't stand for dry skin to buildup on his toes subsequently as a question and further it sounds as if he actually pills this all which is likely keeping the toe is wrong and open. Also think that's what happened in the bottom of his foot although that appears to have healed at this point. There does not appear to be any evidence of infection. 02/20/18 on evaluation today patient appears to be doing better in regard to his foot ulcers. Everything has shown signs of improvement which is excellent news. Overall I'm very pleased with how things appear currently. The patient states he's not picking her point at any skin and that he wears shoes at all times when he is ambulating at home. Both are things that I've advocated and recommended for him in the past. 03/13/18 on evaluation today patient actually appears to be doing very well in regard to the bilateral toes in the overall appearance of the wound bed locations. Fortunately there does not appear to be any signs of infection at this time. He states that he is very pleased with how things seem to be progressing to be honest as am I. 03/27/1899 evaluation today  patient appears to be doing well in regard to his toe ulcers. He just has a few areas remaining and these appear to be showing signs of improvement as well which is great news. Overall I have been impressed with the progress of the past several weeks. 04/10/18 on evaluation today patient appears to be doing a little bit worse in regard to the right plantar foot at the regional the first metatarsal as well as the left second toe. He tells me that  he ran out of bandages he also tells me he's been wearing his flip-flops a lot at home as well as a new pair of boots that he received as well. This is as opposed to the postop shoes that we had previously given him. Nonetheless overall I feel like that he has had a little bit more friction to the area which is causing things to look a little bit worse today. 04/24/18 on evaluation today patient's wounds for the most part appear to be doing excellent especially in regard to the bilateral second toes. With that being said he does have issues with the wound still on the first metatarsal region and first toe of the right foot which have not completely closed at this point. Nonetheless I do feel like that he is making progress which is good news. Electronic Signature(s) Signed: 04/24/2018 5:12:14 PM By: Worthy Keeler PA-C Entered By: Worthy Keeler on 04/24/2018 08:54:59 Polimeni, [name] ([id number]) -------------------------------------------------------------------------------- Physical Exam Details Patient Name: [name], [name] Date of Service: 04/24/2018 8:00 AM Medical Record Number: [id number] Patient Account Number: [id number] Date of Birth/Sex: [birth year] (68 y.o. M) Treating RN: Harold Barban Primary Care Provider: Lamonte Sakai Other Clinician: Referring Provider: Lamonte Sakai Treating Provider/Extender: Melburn Hake, HOYT Weeks in Treatment: 65 Constitutional Well-nourished and well-hydrated in no acute distress. Respiratory normal breathing  without difficulty. clear to auscultation bilaterally. Cardiovascular regular rate and rhythm with normal S1, S2. Psychiatric this patient is able to make decisions and demonstrates good insight into disease process. Alert and Oriented x 3. pleasant and cooperative. Notes Patient's wounds did not show any signs of infection at this point which is great news. Overall I do feel like he has good granulation and is tolerating the alginate will he has noted good improvement even since two weeks ago when I last saw him. Electronic Signature(s) Signed: 04/24/2018 5:12:14 PM By: Worthy Keeler PA-C Entered By: Worthy Keeler on 04/24/2018 08:55:25 [name], [name] ([id number]) -------------------------------------------------------------------------------- Physician Orders Details Patient Name: [name], [name] Date of Service: 04/24/2018 8:00 AM Medical Record Number: [id number] Patient Account Number: [id number] Date of Birth/Sex: June 27, 1950 (68 y.o. M) Treating RN: Harold Barban Primary Care Provider: Lamonte Sakai Other Clinician: Referring Provider: Lamonte Sakai Treating Provider/Extender: Melburn Hake, HOYT Weeks in Treatment: 71 Verbal / Phone Orders: No Diagnosis Coding ICD-10 Coding Code Description L97.521 Non-pressure chronic ulcer of other part of left foot limited to breakdown of skin L97.511 Non-pressure chronic ulcer of other part of right foot limited to breakdown of skin E11.621 Type 2 diabetes mellitus with foot ulcer I87.323 Chronic venous hypertension (idiopathic) with inflammation of bilateral lower extremity I89.0 Lymphedema, not elsewhere classified F17.218 Nicotine dependence, cigarettes, 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 abuse 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, right ankle and foot Wound Cleansing Wound #16 Right,Plantar Toe Great o Clean wound with Normal Saline. Wound #17 Right,Distal,Plantar  Metatarsal head second o Clean wound with Normal Saline. Anesthetic (add to Medication List) Wound #16 Right,Plantar Toe Great o Topical Lidocaine 4% cream applied to wound bed prior to debridement (In Clinic Only). Wound #17 Right,Distal,Plantar Metatarsal head second o Topical Lidocaine 4% cream applied to wound bed prior to debridement (In Clinic Only). Primary Wound Dressing Wound #16 Right,Plantar Toe Great o Silver Alginate - secure with tape Wound #17 Right,Distal,Plantar Metatarsal head second o Silver Alginate - secure with tape Dressing Change Frequency Wound #16 Right,Plantar Toe Great o Change dressing every day. Wound #17 Right,Distal,Plantar Metatarsal head second   o Change dressing every day. o Change dressing every day. Follow-up Appointments JONERIK, SLIKER ([id number]) Wound #16 Right,Plantar Toe Great o Return Appointment in 2 weeks. Wound #17 Right,Distal,Plantar Metatarsal head second o Return Appointment in 2 weeks. o Return Appointment in 2 weeks. Edema Control Wound #16 Right,Plantar Toe Great o Patient to wear own compression stockings Wound #17 Right,Distal,Plantar Metatarsal head second o Patient to wear own compression stockings Additional Orders / Instructions Wound #16 Right,Plantar Toe Great o Stop Smoking o Increase protein intake. Wound #17 Right,Distal,Plantar Metatarsal head second o Stop Smoking o Increase protein intake. Electronic Signature(s) Signed: 04/24/2018 4:21:01 PM By: Harold Barban Signed: 04/24/2018 5:12:14 PM By: Worthy Keeler PA-C Entered By: Harold Barban on 04/24/2018 08:41:26 [name], [name] ([id number]) -------------------------------------------------------------------------------- Problem List Details Patient Name: [name], [name] Date of Service: 04/24/2018 8:00 AM Medical Record Number: [id number] Patient Account Number: [id number] Date of Birth/Sex: Sep 04, 1950 (68 y.o.  M) Treating RN: Harold Barban Primary Care Provider: Lamonte Sakai Other Clinician: Referring Provider: Lamonte Sakai Treating Provider/Extender: Melburn Hake, HOYT Weeks in Treatment: 25 Active Problems ICD-10 Evaluated Encounter Code Description Active Date Today Diagnosis L97.521 Non-pressure chronic ulcer of other part of left foot limited to 09/21/2016 No Yes breakdown of skin L97.511 Non-pressure chronic ulcer of other part of right foot limited to 09/21/2016 No Yes breakdown of skin E11.621 Type 2 diabetes mellitus with foot ulcer 09/21/2016 No Yes I87.323 Chronic venous hypertension (idiopathic) with inflammation of 09/21/2016 No Yes bilateral lower extremity I89.0 Lymphedema, not elsewhere classified 09/21/2016 No Yes F17.218 Nicotine dependence, cigarettes, with other nicotine-induced 09/21/2016 No Yes disorders F10.19 Alcohol abuse with unspecified alcohol-induced disorder 09/21/2016 No Yes M86.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, right ankle and foot 02/21/2017 No Yes Inactive Problems Resolved Problems [name], [name] ([id number]) Electronic Signature(s) Signed: 04/24/2018 5:12:14 PM By: Worthy Keeler PA-C Entered By: Worthy Keeler on 04/24/2018 08:15:42 [name], [name] ([id number]) -------------------------------------------------------------------------------- Progress Note Details Patient Name: Antone, Maxfield E. Date of Service: 04/24/2018 8:00 AM Medical Record Number: [id number] Patient Account Number: [id number] Date of Birth/Sex: 03-31-1951 (68 y.o. M) Treating RN: Harold Barban Primary Care Provider: Lamonte Sakai Other Clinician: Referring Provider: Lamonte Sakai Treating Provider/Extender: Melburn Hake, HOYT Weeks in Treatment: 16 Subjective Chief Complaint Information obtained from Patient Patient presents for treatment of an open diabetic ulcer to both feet History of Present Illness (HPI) 68 year old patient here to see as for bilateral feet ulceration to on  his left first and second toe and 2 on his right first and second toe, which she's had for about 4 months. 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 Donella Stade. The patient is also undergoing workup by medical oncology for a intra-abdominal lymphadenopathy. 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 He is also status post appendectomy, inguinal lymph node biopsy, rectal biopsy and rectal examination under anesthesia. he currently smokes cigarettes about half packet a day. In March of this year he was seen by Dr. Hortencia Pilar, for evaluation of bilateral varicose veins and besides wearing compression stockings he had recommended laser ablation of the right and left great saphenous veins to eleviate the symptoms and complications of severe superficial venous reflux disease. He also recommended lymphedema pumps for better control of his lymphedema. The patient recently has had  on 08/23/2016, right greater saphenous vein ablation with the laser energy Earlier lower extremity venous reflux examination done on 05/08/2016 showed no DVT or SVT both lower legs but incompetence of bilateral great saphenous veins was present. A lower arterial study was also done and there was no significant right lower and left lower extremity problems based on a normal toe brachial index bilaterally and the ABI was 1.21 the left and 1.23 on the right. His post ablation venous duplex examination showed successful ablation of the right GS vein with thrombus formation 2 below the right saphenofemoral junction. The deep system was patent without evidence of thrombosis and this was done on 08/30/2016. the patient also has a squamous cell cancer of the skin of the buttock and is  recently undergone radiation therapy for this prior to excisional surgery. Addendum: regarding his x-rays done today and x-ray of the left foot -- IMPRESSION: No objective evidence of osteomyelitis. There are soft tissue changes which may reflect cellulitis. X-ray of the right foot -- IMPRESSION:Findings compatible with cellulitis of the toes. No objective evidence of osteomyelitis is observed. 10/01/16 on evaluation today patient's wounds appeared to be doing some better. I did review the x-rays as well which showed no evidence of osteomyelitis although there was evidence on x-ray of cellulitis. He fortunately is not having any discomfort although he continues to have some swelling. He does not remember being on any antibiotics recently. 10/15/16 on evaluation today patient's wounds overall appear to be doing better although he does have a new location noted on the left foot. Fortunately he is not having significant pain. It almost has the appearance that something is rubbing on the end of his toes but he wears the open toe shoes and according to what he is telling me never wears anything that would rub on his foot. There is no evidence of infection and specifically no evidence of a fungal infection 10/22/16 On evaluation today patient's wounds appeared to be doing better compared to last week in regard to his bilateral lower extremities. Fortunately I happy with how things are progressing although he still has ulcers I feel like that he is improving and appropriate manner. ALTIN, SEASE ([id number]) 11/12/16 on evaluation today patient appears to be doing well in regard to his bilateral feet and the respective wounds. We have been using surrounding her dressings along with an antifungal cream which seems to be doing very well. He has no bilateral dysfunction noticed that the rituals are weight loss at this point. He also has no nausea or vomiting a note purulent discharge. He did see Vein and  vascular today and he tells me that they told him he could have surgery for his venous stasis but they did not feel like it was worth it in his words. Fortunately patient's wounds do appear to be getting sneakily better. 11/26/2016 -- he says he is going to have some surgery during this week at [facility] for possibly a colon resection. 12/31/2016 -- the patient has been noncompliant with his smoking and I'm not sure whether he is also started drinking again. He continues to be very nonchalant about his care 01/14/2017 -- the patient's HandP has been reviewed well and I understand he is being compliant with trying to give up smoking and his local dressing changes. He does not have any surgical options of 4 to him by his vascular surgeons.he was last seen in early August by Dr. Hortencia Pilar who recommended compression stockings,and possibly lymph  pumps in 2-3 months after doing a review ultrasound. 01/28/2017 - the patient did not have any fresh complaints but on examination I noted a large lacerated wound on the plantar aspect of his right fourth toe which had a lot of necrotic debris and it probes down to bone. 02/07/2017 -- x-ray of the right foot -- IMPRESSION: Soft tissue swelling about the first through fourth toes consistent with cellulitis. New destructive change in the tuft of the distal phalanx of the great toe is consistent with osteomyelitis. 02/14/2017 -- the patient's MRI is pending this coming Monday and he still continues to smoke. We have again gone over off loading of his wounds in great detail and he says he's been compliant. 02/21/2017 -- MR of the right foot -- IMPRESSION: 1. Soft tissue ulcer at the tip of the first, second and third toe knows. Cortical irregularity and bone marrow edema in the first distal phalanx most concerning for osteomyelitis. Mild marrow edema in the second and third distal phalanx without definite cortical destruction which may reflect early  osteomyelitis versus reactive marrow edema. 2. Soft tissue edema surrounding the first phalanx most consistent with cellulitis. the patient was also recently evaluated by his medical oncologist Dr. 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 She is treating him with weekly Procrit. She is also keeping intra-abdominal lymphadenopathy and right lower lobe lung nodule under observation. 04/04/2017 -- he was seen by Dr. Adrian Prows on 03/25/2017 -- after review he empirically put him on ciprofloxacin and doxycycline as they have good bone penetration and good bioavailability and it will cover the usual pathogens and diabetic foot osteomyelitis. He will check inflammatory markers and plan a 16-37 week old records. C-reactive protein was 0.3 and the ESR was 72 04/18/17 on evaluation today patient appears to be doing about the same in regard to his lower extremity wounds bilaterally. He has continued to use the antifungal cream which does seem to be beneficial. Nonetheless the ulcers do seem to in some areas be epithelial eyes over and in other areas are still open. He is having no significant discomfort. 04/25/17-he is here in follow-up evaluation for multiple ulcerations to multiple toes bilaterally. He states he did see Dr. Ola Spurr again last week and continues antibiotic therapy. He is voicing no complaints or concerns, will continue with current treatment plan will possibility of adding compression therapy next week after an additional week of treatment/lotions to BLE prescribed by Dr Ola Spurr 05/02/17 he is here in follow up for for multiple ulcers to multiple toes bilaterally. we will stop using antifungal cream and will continue with silvercel and follow up next week 05/09/17-he is here in follow-up for multiple ulcerations to multiple toes bilaterally. There is improvement in appearance. He has not completely stopped  using antifungal cream, but admits he has not using it between the toes. He has an appointment with Dr. Ola Spurr on 2/11, continues on doxycycline and Cipro. It has been 5 weeks of antibiotic therapy, we will order plain film xray to evaluate for osteomyelitis next week, prior to follow up with ID. Will continue with silvercel and follow up next week 05/16/17-he is here in follow-up evaluation for multiple ulcerations to multiple toes bilaterally and new wound to the right posterior heel. There is essentially no change in appearance, deteriorating measurements; he has a history of waxing and waning measurements. He admits that he continues to apply moisturizer/cream/ointment to his toes despite  weekly reminders to only apply silvercel to his toes. He states that he thinks the surgical shoe contributed to the superficial ulcer to his posterior heel, he is unable to articulate if this was an area of dry cracked skin as he has a similar area to the left heel. He now is wearing open toed slippers. He has an appointment with Dr. Ola Spurr on 2/11. We have ordered x-rays for her bilateral feet; Johnsey, Garen E. ([id number]) he was advised to obtain the x-rays today or tomorrow. He will follow-up next week 05/23/17-he is here in follow-up evaluation for multiple ulcerations to multiple toes bilaterally and the right posterior heel. There is improvement in maceration. He has been compliant and not applying any moisturizing agent to his toes. He has been using Lac-Hydrin for his lower extremities with improvement. He did not go to his appointment on Monday with Dr. Ola Spurr secondary to financial concerns. X-rays for her bilateral feet showed: LEFT FOOT with slight erosion of the tuft of the distal phalanges of the left first and second toe suspicious for osteomyelitis, RIGHT FOOT with 1.erosion of the tufts of the distal phalanges of the right first second and possibly third toes consistent with  osteomyelitis, 2 no definitive abnormality of the calcaneus is seen on the images obtained, 3. Plantar calcaneal degenerative spur. We briefly discussed hyperbaric adjunctive therapy for treatment of chronic refractory osteomyelitis. I do not find an a1c in EMR, will contact PCP for record, or order if needed. He has been encouraged to contact Dr Ola Spurr office regarding the follow-up appointment, encouraged him to inquire about payment plan. We will continue with same treatment plan and follow-up next week. He states he is still taking antibiotics and has "a lot" left. He states he has been taking them as directed, 2 pills twice daily. According to Dr. Blane Ohara office notes he was originally started on 12/17 for 4 weeks and extended on 1/14 for an additional 4 weeks. He should be done with his antibiotic therapy, he was advised to bring his bottles and to his next appointment, we will contact pharmacy. 05/30/17-he is here in follow-up evaluation for multiple ulcerations to multiple toes bilaterally and the right posterior heel. He is accompanied by his brother-in-law. Wounds are stable. He has yet to make up with Dr. Ola Spurr. We contacted his PCP, with no record of recent A1c we will draw an A1c. His brother-in-law states that he was taken off all of his diabetic medication secondary to kidney function. 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 The antibiotics were counted and have approximately 2 weeks left, although they should be complete. The pill organizer reveals missing days. We discussed the need for consistent medications, to have optimal benefit of medication. He has a cousin that lives with him and he will ask her to check his organizer daily. He has been advised to follow up with Dr Ola Spurr, and will go by the office today. He has been advised to quit smoking. 06/06/17-he is  here in follow up evaluation. He has had to make an appointment with Dr. Ola Spurr. He did have blood work obtained, a1c 5. He continues to take antibiotic therapy. Significant improvement in bilateral lower extremity edema with compression therapy. Essentially no change in ulcerations to toes. He states he is "going to try something different" and "let me know next week" if it works; he would not provide any additional information and was  encouraged to follow our orders. We will follow up next week 06/13/17-he is here in follow-up evaluation. He has an appointment with Dr. Ola Spurr tomorrow morning. He states he purchased an ointment from [facility] and applied to his toes for 3 days, he does not remember the name of the ointment. There is improvement to his wounds, minimal maceration. He continues to take antibiotic therapy, this should have been completed last month. His brother-in-law who regularly accompanies his appointments was asked to take the bottles to the appointment tomorrow with Dr. Ola Spurr so he is aware. We will continue with 3 layer compression, and order OPEN TOE compression 20-30mmHg; we will apply compression stockings next week. He continues to smoke, smoked "2 cigarettes" last week 06/20/17 on evaluation today patient did receive his compression stockings which he has with him today for both lower extremities. With that being said he tells me at this point in time that he is very happy to have these he really is not a big fan of the compression wraps that we have been utilizing although they have been of great benefit for him. Nonetheless at this point he does want to switch to the compression stockings. In my opinion as long as he is continuing with compression I'm okay with the stockings or the wraps. 06/27/17-he is here in follow-up evaluation for multiple ulcerations to his bilateral toes. There is some improvement in appearance. He is compliant in wearing his compression  stockings with significant improvement in lower extremity edema. He saw Dr Ola Spurr on 3/8, per his notes they would redraw ESR and CRP; plan to continue antibiotic therapy if these remain elevated. I do not see an ESR or CRP level in Epic. The patient continues to take antibiotics. 07/11/17-He is here in follow-up evaluation for multiple ulcerations to multiple toes bilaterally. He presents with complete epithelialization to the right third toe; there has been no deterioration. He continues on antibiotic therapy. He will follow-up next week 07/18/17-He is here in follow-up evaluation for multiple ulcerations to multiple toes bilaterally. He continues to make improvement. He continues on antibiotic therapy. He states he has been using something additional to our orders, he does not elaborate but states he will bring it in next week. 07/25/17-He is here in follow up evaluation for multiple ulcerations to bilateral toes. He is stable. He has completed antibiotic therapy. He admits to "filing" his toes after showers each evening, this is what he was referencing last week; he does not filing for the wounds. We will switch to [facility] and monitor for any improvement, he will follow-up next week 08/01/17-He is here in follow-up evaluation. He admits to "picking" at his toes after cleansing yesterday, leading to new areas of tissue loss on the bilateral second toe. There is improvement noted to the bilateral great toe. We will dress toes today and hope that they maintain until Monday where he will come in for a nurse visit. He has been advised, multiple times with expressed verbalization, to change the dressings to silvercel if the dressings get wet prior to [facility] appointment. 08/08/17-He is here in follow-up evaluation for bilateral first and second toe ulcerations. There is significant improvement to all ulcerations since last visit. We will switch to [facility] to all wounds and he will continue  with nurse visits on a Cater, Shakur E. ([id number]) Monday/Thursday schedule and follow-up with me in 2 weeks. He continues to smoke, 1-3 cigarettes per day, and has been encouraged to not smoke until his next  follow-up in 2 weeks. 08/15/17 on evaluation today patient's ulcers on his toes actually appear to be doing fairly well the right to ulcers may be a little bit more moist compared to the left I'm not really sure exactly why as the openings appear to be very small and I'm not seeing any evidence of anything significant as far as you lightest or otherwise. Nonetheless this may just be a small setback he's been doing very well with the [facility] Dressing 08/22/17-He is here in follow-up evaluation for ulcerations to his bilateral first and second toes, there is small/scant amount of drainage noted on today's dressing. He continues with Hydrofera Blue. He continues to smoke, 1 cigarette a day. Voices no complaint or concerns, compliant and compression therapy today. He'll follow-up next week 08/29/17-He is here in follow-up evaluation for ulcerations to his bilateral first and second toes, with a new wound to the left third toe. He states he cut himself while cutting the toenail. He presents today with more maceration and increased measurements; waxing and waning measurements and moisture has been an ongoing issue. He continues to smoke a proximally 1 cigarette per day, but states he has not smoked since Sunday. We will initiate medihoney daily and evaluate next week. He presents today without compression stockings 09/05/17-He is here in follow-up evaluation for ulcerations to bilateral first and second and left third toe. He did not pick up the medihoney and therefore has not been changing his dressing. He admits to inconsistent where of compression stockings, admits to pain to his bilateral lower extremities with unilateral edema (right greater than left). The ulcerations to multiple toes are  more macerated and larger in size than last week. He is wearing compression therapy today. He admits to an overall feeling of weakness and fatigue and has been encouraged to contact his PCP for evaluation; he has a known history of anemia and intraabdominal lymphadenopathy. We will return to silvercel and he will follow up next week 09/19/17-He is here in follow-up evaluation for ulcerations to bilateral first and second toe.he was unable to make last week's appointment. The culture that was obtained on 5/30 grew clindamycin sensitive MRSA; he was initiated on 6/4 but did not start it until 6/7. There is significant improvement in all wounds, healing to left second and third toe. He also admits to having no alcohol or smoking for the last 6 days. We will continue with same treatment plan I will extend the clindamycin for an additional 10 days and he will follow-up next week. 09/26/17- He is here in follow evaluation for multiple ulcerations to multiple toes bilaterally. He continues to be non-compliant with dressing, compression therapy. He states he is taking the antibiotics as prescribed, although his recall is inconsistent. There is noted deterioration in all ulcers. We will continue same treatment plan, antibiotic therapy and he will follow up next week. 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 This information is obtained from the medical record, as he cannot articulate specifics to his diagnosis or treatment plan. 10/17/17-He is here in follow-up evaluation for multiple ulcerations to multiple toes bilaterally. There is chronic waxing and waning improvement/deterioration. On today's exam they appear improved, dry without maceration. He continues to apply a variety of topical agents, stating that he has switched between the  blue product and silver  product. In my opinion, this will be a chronic waxing and waning giving his intermittent compliance. He will follow-up in 2 weeks. As it relates to the biopsy taken from his buttock, he has not followed up with that and has not started radiation therapy. 10/31/17-He is here for evaluation for multiple ulcerations to multiple toes bilaterally. Continues with waxing and waning improvement versus deterioration. They are stable on today's exam without significant maceration. 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 We will continue with every 2 week follow-up appointments 11/14/17- He is seen in follow-up evaluation for multiple ulcerations to multiple bilateral toes. He is scheduled for excision of basal cell carcinoma to his right buttock on Monday 8/12. He continues to have chronic waxing and waning of his ulcers; today is a stable day with minimal drainage and left ear is better than right toes. I will recount to infectious disease regarding chronic suppressive therapy. He admits to being more compliant with compression stockings, although he is not wearing any today. He has been encouraged to continue with dressing changes as ordered and compression therapy daily. He will be seen in 2 weeks 11/28/17-He is seen in follow up evaluation for multiple ulcerations to multiple toes bilaterally. He had an overnight admission at [facility] (8/11-8/12) for symptomatic anemia, received two units prbc. His surgical date has been moved to 9/94 excision of basal cell carcinoma to the right buttock. He has been noncompliant in wearing compression stockings, applying topical treatment as ordered; he has been applying a "cream", Hydrofera Blue, silvercel. He is currently on no antibiotic therapy; we will send for re-referral to ID for suppressive therapy given his multiple co-morbidities, poor surgical candidate, unwilling to have toe amputation and general  non-adherence. 12/19/17 on evaluation today patient actually appears to be doing very well in regard to his toes at least compared to last time I saw him. He again has been coming to our office for quite a bit of time. 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 He states is hurting a little bit but doing well in general. I'm see were not taking care of this area dermatology is. He does have his appointment with infectious disease upcoming which they re-consult for suppressive therapy that's next Monday. 01/02/18 on evaluation today patient actually appears to be doing rather well in regard to his toe ulcers although he tells me he Gregori, Kadeem E. ([id number]) has been put in an ointment on this which I am concerned may be causing some additional moisture buildup which we really do not want. He supposed be using silver cell which is mainly thing I want him to be utilizing. Fortunately there does not appear to be any evidence of worsening infection which is good news. He has had surgery on his gluteal region where he had an excision of a cancerous area fortunately that seems to be doing well although he is having some discomfort. 02/06/18 on evaluation today patient actually appears to be doing well at all locations except for his second toe use bilaterally where the distal tip is still wrong open. He did state that he can't stand for dry skin to buildup on his toes subsequently as a question and further it sounds as if he actually pills this all which is likely keeping the toe is wrong and open. Also think that's what happened in the bottom  of his foot although that appears to have healed at this point. There does not appear to be any evidence of infection. 02/20/18 on evaluation today patient appears to be doing better in regard to his foot ulcers. Everything has shown signs of improvement which  is excellent news. Overall I'm very pleased with how things appear currently. The patient states he's not picking her point at any skin and that he wears shoes at all times when he is ambulating at home. Both are things that I've advocated and recommended for him in the past. 03/13/18 on evaluation today patient actually appears to be doing very well in regard to the bilateral toes in the overall appearance of the wound bed locations. Fortunately there does not appear to be any signs of infection at this time. He states that he is very pleased with how things seem to be progressing to be honest as am I. 03/27/1899 evaluation today patient appears to be doing well in regard to his toe ulcers. He just has a few areas remaining and these appear to be showing signs of improvement as well which is great news. Overall I have been impressed with the progress of the past several weeks. 04/10/18 on evaluation today patient appears to be doing a little bit worse in regard to the right plantar foot at the regional the first metatarsal as well as the left second toe. He tells me that he ran out of bandages he also tells me he's been wearing his flip-flops a lot at home as well as a new pair of boots that he received as well. This is as opposed to the postop shoes that we had previously given him. Nonetheless overall I feel like that he has had a little bit more friction to the area which is causing things to look a little bit worse today. 04/24/18 on evaluation today patient's wounds for the most part appear to be doing excellent especially in regard to the bilateral second toes. With that being said he does have issues with the wound still on the first metatarsal region and first toe of the right foot which have not completely closed at this point. Nonetheless I do feel like that he is making progress which is good news. Patient History Information obtained from Patient. Social History Current every day  smoker, Marital Status - Widowed, Alcohol Use - Daily - quit drinking about a week ago, Drug Use - No History, Caffeine Use - Moderate. 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 Respiratory The patient has no complaints or symptoms. Cardiovascular The patient has no complaints or symptoms. Psychiatric The patient has no complaints or symptoms. NASARIO, CZERNIAK ([id number]) Objective Constitutional Well-nourished and well-hydrated in no acute distress. Vitals Time Taken: 8:10 AM, Height: 69 in, Weight: 168 lbs, BMI: 24.8, Temperature: 98.2 F, Pulse: 73 bpm, Respiratory Rate: 16 breaths/min, Blood Pressure: 168/82 mmHg. Respiratory normal breathing without difficulty. clear to auscultation bilaterally. Cardiovascular regular rate and rhythm with normal S1, S2. Psychiatric this patient is able to make decisions and demonstrates good insight into disease process. Alert and Oriented x 3. pleasant and cooperative. General Notes: Patient's wounds did not show any signs of infection at this point which is great news. Overall I do feel like he has good granulation and is tolerating the alginate will he has noted good improvement even since two  weeks ago when I last saw him. Integumentary (Hair, Skin) Wound #14 status is Healed - Epithelialized. Original cause of wound was Gradually Appeared. The wound is located on the Left Toe Second. The wound measures 0cm length x 0cm width x 0cm depth; 0cm^2 area and 0cm^3 volume. Wound #16 status is Open. Original cause of wound was Not Known. The wound is located on the [employer]. The wound measures 1.3cm length x 0.7cm width x 0.1cm depth; 0.715cm^2 area and 0.071cm^3 volume. There is Fat Layer (Subcutaneous Tissue) Exposed exposed. There is no tunneling or  undermining noted. There is a medium amount of serous drainage noted. The wound margin is flat and intact. There is large (67-100%) pink granulation within the wound bed. There is a small (1-33%) amount of necrotic tissue within the wound bed including Adherent Slough. The periwound skin appearance exhibited: Maceration. The periwound skin appearance did not exhibit: Callus, Crepitus, Excoriation, Induration, Rash, Scarring, Dry/Scaly, Atrophie Blanche, Cyanosis, Ecchymosis, Hemosiderin Staining, Mottled, Pallor, Rubor, Erythema. Periwound temperature was noted as No Abnormality. Wound #17 status is Open. Original cause of wound was Trauma. The wound is located on the Right,Distal,Plantar Metatarsal head second. The wound measures 0.8cm length x 0.3cm width x 0.1cm depth; 0.188cm^2 area and 0.019cm^3 volume. There is Fat Layer (Subcutaneous Tissue) Exposed exposed. There is no tunneling or undermining noted. There is a medium amount of serous drainage noted. The wound margin is flat and intact. There is large (67-100%) pink granulation within the wound bed. There is a small (1-33%) amount of necrotic tissue within the wound bed including Adherent Slough. The periwound skin appearance did not exhibit: Callus, Crepitus, Excoriation, Induration, Rash, Scarring, Dry/Scaly, Maceration, Atrophie Blanche, Cyanosis, Ecchymosis, Hemosiderin Staining, Mottled, Pallor, Rubor, Erythema. Periwound temperature was noted as No Abnormality. Wound #2 status is Healed - Epithelialized. Original cause of wound was Gradually Appeared. The wound is located on the Right Toe Second. The wound measures 0cm length x 0cm width x 0cm depth; 0cm^2 area and 0cm^3 volume. 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 Wound #17 Right,Distal,Plantar Metatarsal head second: Clean wound with Normal Saline. Anesthetic (add to Medication List): Wound #16 Right,Plantar Toe Great: Topical Lidocaine 4% cream applied to wound bed prior to debridement (In Clinic Only). Wound #17 Right,Distal,Plantar Metatarsal head second: Topical Lidocaine 4% cream applied to wound bed prior to debridement (In Clinic Only). Primary Wound Dressing: Wound #16 Right,Plantar Toe Great: Silver Alginate - secure with tape Wound #17 Right,Distal,Plantar Metatarsal head second: Silver Alginate - secure with tape Dressing Change Frequency: Wound #16 Right,Plantar Toe Great: Change dressing every day. Wound #17 Right,Distal,Plantar Metatarsal head second: Change dressing every day. Change dressing every day. Follow-up Appointments: Wound #16 Right,Plantar Toe Great: Return Appointment in 2 weeks. Wound #17 Right,Distal,Plantar Metatarsal head second: Return Appointment in 2 weeks. Return Appointment in 2 weeks. Edema Control: Wound #16 Right,Plantar Toe Great: Patient to wear own compression stockings Wound #17 Right,Distal,Plantar Metatarsal head second: Patient to wear own compression stockings Additional Orders /  Instructions: Wound #16 Right,Plantar Toe Great: Stop Smoking Increase protein intake. Wound #17 Right,Distal,Plantar Metatarsal head second: Mcgarvey, Philander E. ([id number]) Stop Smoking Increase protein intake. I'm in a recommend currently that we continue with the above wound care measures. He is in  agreement with the plan. If anything changes he will let me know. Please see above for specific wound care orders. We will see patient for re-evaluation in 1 week(s) here in the clinic. If anything worsens or changes patient will contact our office for additional recommendations. Electronic Signature(s) Signed: 04/24/2018 5:12:14 PM By: Worthy Keeler PA-C Entered By: Worthy Keeler on 04/24/2018 08:55:43 Kisamore, [name] ([id number]) -------------------------------------------------------------------------------- ROS/PFSH Details Patient Name: Lindahl, Hong E. Date of Service: 04/24/2018 8:00 AM Medical Record Number: [id number] Patient Account Number: [id number] Date of Birth/Sex: September 30, 1950 (68 y.o. M) Treating RN: Harold Barban Primary Care Provider: Lamonte Sakai Other Clinician: Referring Provider: Lamonte Sakai Treating Provider/Extender: Melburn Hake, HOYT Weeks in Treatment: 56 Information Obtained From Patient Wound History Do you currently have one or more open woundso Yes How many open wounds do you currently haveo 6 Approximately how long have you had your woundso 3 months How have you been treating your wound(s) until nowo ointment and bandage Has your wound(s) ever healed and then re-openedo No Have you had any lab work done in the past montho No Have you tested positive for an antibiotic resistant organism (MRSA, VRE)o No Have you tested positive for osteomyelitis (bone infection)o No Have you had any tests for circulation on your legso Yes Who ordered the testo PCP Where was the test doneo [facility] Constitutional Symptoms (General Health) Complaints and Symptoms: Negative for: Fever; Chills Eyes Medical History: Negative for: Cataracts; Glaucoma; Optic Neuritis Ear/Nose/Mouth/Throat Medical History: Negative for: Chronic sinus problems/congestion; Middle ear problems Hematologic/Lymphatic Medical History: Positive for: Anemia; Lymphedema Negative for: Hemophilia;  Human Immunodeficiency Virus; Sickle Cell Disease Respiratory Complaints and Symptoms: No Complaints or Symptoms Medical History: Negative for: Aspiration; Asthma; Chronic Obstructive Pulmonary Disease (COPD); Pneumothorax; Sleep Apnea; Tuberculosis Cardiovascular Complaints and Symptoms: No Complaints or Symptoms Castles, Asael E. ([id number]) Medical History: Positive for: Congestive Heart Failure; Hypertension; Peripheral Venous Disease Negative for: Angina; Arrhythmia; Coronary Artery Disease; Deep Vein Thrombosis; Hypotension; Myocardial Infarction; Peripheral Arterial Disease; Phlebitis; Vasculitis Gastrointestinal Medical History: Negative for: Cirrhosis ; Colitis; Crohnos; Hepatitis A; Hepatitis B; Hepatitis C Endocrine Medical History: Positive for: Type II Diabetes Treated with: Oral agents Blood sugar tested every day: Yes Tested : QD Genitourinary Medical History: Negative for: End Stage Renal Disease Immunological Medical History: Negative for: Lupus Erythematosus; Raynaudos; Scleroderma Integumentary (Skin) Medical History: Negative for: History of Burn; History of pressure wounds Musculoskeletal Medical History: Negative for: Gout; Rheumatoid Arthritis; Osteoarthritis; Osteomyelitis Neurologic Medical History: Positive for: Neuropathy Negative for: Dementia; Quadriplegia; Paraplegia; Seizure Disorder Oncologic Medical History: Past Medical History Notes: 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 ([id number]) Immunization Notes: up to date Implantable Devices Family and Social History Current every day smoker; Marital Status - Widowed; Alcohol Use: Daily - quit drinking about a week ago; Drug Use: No History; Caffeine Use: Moderate; Financial Concerns: No; Food, Clothing or  Shelter Needs: No; Support System Lacking: No; Transportation Concerns: No; Advanced Directives: No; Patient does not want information on Advanced Directives Physician Affirmation I have reviewed and agree with the above information. Electronic Signature(s) Signed: 04/24/2018 4:21:01 PM By: Harold Barban Signed:  04/24/2018 5:12:14 PM By: Worthy Keeler PA-C Entered By: Worthy Keeler on 04/24/2018 08:55:13 Laspina, [name] ([id number]) -------------------------------------------------------------------------------- SuperBill Details Patient Name: Stickles, Ewin E. Date of Service: 04/24/2018 Medical Record Number: [id number] Patient Account Number: [id number] Date of Birth/Sex: February 06, 1951 (68 y.o. 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 of skin E11.621 Type 2 diabetes mellitus with foot ulcer I87.323 Chronic venous hypertension (idiopathic) with inflammation of bilateral lower extremity I89.0 Lymphedema, not elsewhere classified F17.218 Nicotine dependence, cigarettes, 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 abuse 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, right ankle and foot Facility Procedures CPT4 Code: 33295188 Description: 99213 - WOUND CARE VISIT-LEV 3 EST PT Modifier: Quantity: 1 Physician Procedures CPT4 Code Description: [id number] 01601 - WC PHYS LEVEL 4 - EST PT ICD-10 Diagnosis Description L97.521 Non-pressure chronic ulcer of other part of left foot limited to L97.511 Non-pressure chronic ulcer of other part of right foot limited t E11.621 Type 2  diabetes  mellitus with foot ulcer I87.323 Chronic venous hypertension (idiopathic) with inflammation of bi Modifier: breakdown of s o breakdown of lateral lower e Quantity: 1 kin skin xtremity Electronic Signature(s) Signed: 04/24/2018 5:12:14 PM By: Worthy Keeler PA-C Entered By: Worthy Keeler on 04/24/2018 08:58:06

## 2018-04-26 NOTE — Progress Notes (Signed)
KIYOSHI, SCHAAB ([id number]) Visit Report for 04/24/2018 Arrival Information Details Patient Name: [name], [name] KYSER. Date of Service: 04/24/2018 8:00 AM Medical Record Number: [id number] Patient Account Number: [id number] Date of Birth/Sex: 06/01/1950 (68 y.o. M) Treating RN: [name] Primary Care [name]: [name] Other Clinician: Referring [name]: [name] Treating [name]: [name], [name]: 23 Visit Information History Since Last Visit Added or deleted any medications: No Patient Arrived: Ambulatory Any new allergies or adverse reactions: No Arrival Time: 08:08 Had a fall or experienced change in No Accompanied By: brother in activities of daily living that may affect law risk of falls: Transfer Assistance: Manual Signs or symptoms of abuse/neglect since last visito No Patient Identification Verified: Yes Hospitalized since last visit: No Secondary Verification Process Completed: Yes Implantable device outside of the clinic excluding No Patient Requires Transmission-Based No cellular tissue based products placed in the center Precautions: since last visit: Patient Has Alerts: Yes Has Dressing in Place as Prescribed: Yes Patient Alerts: DMII Pain Present Now: No Electronic Signature(s) Signed: 04/24/2018 4:16:05 PM By: [name] RCP, RRT, CHT Entered By: [name], [name] on 04/24/2018 08:09:51 [name], [name] ([id number]) -------------------------------------------------------------------------------- Clinic Level of Care Assessment Details Patient Name: [name], [name] Date of Service: 04/24/2018 8:00 AM Medical Record Number: [id number] Patient Account Number: [id number] Date of Birth/Sex: 11-Feb-1951 (68 y.o. M) Treating RN: [name] Primary Care Shanna Strength: [name] Other Clinician: Referring [name]: [name] Treating [name]: [name], [name]: 58 Clinic  Level of Care Assessment Items TOOL 4 Quantity Score []  - Use when only an EandM is performed on FOLLOW-UP visit 0 ASSESSMENTS - Nursing Assessment / Reassessment X - Reassessment of Co-morbidities (includes updates in patient status) 1 10 X- 1 5 Reassessment of Adherence to [name] Plan ASSESSMENTS - Wound and Skin Assessment / Reassessment []  - Simple Wound Assessment / Reassessment - one wound 0 X- 2 5 Complex Wound Assessment / Reassessment - multiple wounds []  - 0 Dermatologic / Skin Assessment (not related to wound area) ASSESSMENTS - Focused Assessment []  - Circumferential Edema Measurements - multi extremities 0 []  - 0 Nutritional Assessment / Counseling / Intervention []  - 0 Lower Extremity Assessment (monofilament, tuning fork, pulses) []  - 0 Peripheral Arterial Disease Assessment (using hand held doppler) ASSESSMENTS - Ostomy and/or Continence Assessment and Care []  - Incontinence Assessment and Management 0 []  - 0 Ostomy Care Assessment and Management (repouching, etc.) PROCESS - Coordination of Care X - Simple Patient / Family Education for ongoing care 1 15 []  - 0 Complex (extensive) Patient / Family Education for ongoing care X- 1 10 Staff obtains Programmer, systems, Records, Test Results / Process Orders []  - 0 Staff telephones HHA, Nursing Homes / Clarify orders / etc []  - 0 Routine Transfer to another Facility (non-emergent condition) []  - 0 Routine Hospital Admission (non-emergent condition) []  - 0 New Admissions / Biomedical engineer / Ordering NPWT, Apligraf, etc. []  - 0 Emergency Hospital Admission (emergent condition) X- 1 10 Simple Discharge Coordination [name], [name] ([id number]) []  - 0 Complex (extensive) Discharge Coordination PROCESS - Special Needs []  - Pediatric / Minor Patient Management 0 []  - 0 Isolation Patient Management []  - 0 Hearing / Language / Visual special needs []  - 0 Assessment of Community assistance (transportation, D/C  planning, etc.) []  - 0 Additional assistance / Altered mentation []  - 0 Support Surface(s) Assessment (bed, cushion, seat, etc.) INTERVENTIONS - Wound Cleansing / Measurement []  - Simple Wound Cleansing -  one wound 0 X- 2 5 Complex Wound Cleansing - multiple wounds X- 1 5 Wound Imaging (photographs - any number of wounds) []  - 0 Wound Tracing (instead of photographs) []  - 0 Simple Wound Measurement - one wound X- 2 5 Complex Wound Measurement - multiple wounds INTERVENTIONS - Wound Dressings X - Small Wound Dressing one or multiple wounds 2 10 []  - 0 Medium Wound Dressing one or multiple wounds []  - 0 Large Wound Dressing one or multiple wounds []  - 0 Application of Medications - topical []  - 0 Application of Medications - injection INTERVENTIONS - Miscellaneous []  - External ear exam 0 []  - 0 Specimen Collection (cultures, biopsies, blood, body fluids, etc.) []  - 0 Specimen(s) / Culture(s) sent or taken to Lab for analysis []  - 0 Patient Transfer (multiple staff / Civil Service fast streamer / Similar devices) []  - 0 Simple Staple / Suture removal (25 or less) []  - 0 Complex Staple / Suture removal (26 or more) []  - 0 Hypo / Hyperglycemic Management (close monitor of Blood Glucose) []  - 0 Ankle / Brachial Index (ABI) - do not check if billed separately X- 1 5 Vital Signs [name], [name] ([id number]) Has the patient been seen at the hospital within the last three years: Yes Total Score: 110 Level Of Care: New/Established - Level 3 Electronic Signature(s) Signed: 04/24/2018 4:21:01 PM By: [name] Entered By: [name] on 04/24/2018 08:40:35 Coonradt, [name] ([id number]) -------------------------------------------------------------------------------- Lower Extremity Assessment Details Patient Name: [name], [name] Date of Service: 04/24/2018 8:00 AM Medical Record Number: [id number] Patient Account Number: [id number] Date of Birth/Sex: [birth year] (68 y.o. M) Treating  RN: [name] Primary Care [name]: [name] Other Clinician: Referring [name]: [name] Treating Shonta Phillis/Extender: [name], [name]: 29 Vascular Assessment Pulses: Dorsalis Pedis Palpable: [Right:Yes] Posterior Tibial Extremity colors, hair growth, and conditions: Extremity Color: [Right:Hyperpigmented] Hair Growth on Extremity: [Right:No] Temperature of Extremity: [Right:Warm] Capillary Refill: [Right:< 3 seconds] Toe Nail Assessment Left: Right: Thick: Yes Discolored: Yes Deformed: No Improper Length and Hygiene: Yes Electronic Signature(s) Signed: 04/24/2018 4:56:33 PM By: [name] Entered By: [name] on 04/24/2018 08:19:24 [name], [name] ([id number]) -------------------------------------------------------------------------------- Multi Wound Chart Details Patient Name: [name], [name] Date of Service: 04/24/2018 8:00 AM Medical Record Number: [id number] Patient Account Number: [id number] Date of Birth/Sex: July 31, 1950 (68 y.o. 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] [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] [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] Comorbid History: [14:N/A] [16:Anemia, Lymphedema, Congestive Heart Failure, Hypertension, Peripheral Venous Disease, Type II Diabetes, Neuropathy]  [17:Anemia, Lymphedema, Congestive Heart Failure, Hypertension, Peripheral Venous Disease, Type II  Diabetes, Neuropathy] Date Acquired: [14:09/26/2017] [16:10/29/2017] [17:02/03/2018] Weeks of [name]: [14:30] [16:25] [17:11] Wound Status: [14:Healed - Epithelialized] [16:Open] [17:Open] Pending Amputation on [14:No] [16:No] [17:No] Presentation: Measurements L x W x D [14:0x0x0] [16:1.3x0.7x0.1] [17:0.8x0.3x0.1] (cm) Area (cm) : [14:0] [16:0.715] [17:0.188] Volume (cm) : [14:0] [16:0.071] [17:0.019] % Reduction in Area: [14:100.00%] [16:55.40%] [17:46.70%] % Reduction in Volume: [14:100.00%] [16:55.60%] [17:45.70%] Classification: [14:Grade 1] [16:Grade 1] [17:Grade 1] Exudate Amount: [  14:N/A] [16:Medium] [17:Medium] Exudate Type: [14:N/A] [16:Serous] [17:Serous] Exudate Color: [14:N/A] [16:amber] [17:amber] Wound Margin: [14:N/A] [16:Flat and Intact] [17:Flat and Intact] Granulation Amount: [14:N/A] [16:Large (67-100%)] [17:Large (67-100%)] Granulation Quality: [14:N/A] [16:Pink] [17:Pink] Necrotic Amount: [14:N/A] [16:Small (1-33%)] [17:Small (1-33%)] Epithelialization: [14:N/A] [16:None] [17:None] Periwound Skin Texture: [14:No Abnormalities Noted] [16:Excoriation: No Induration: No Callus: No Crepitus: No Rash: No Scarring: No] [17:Excoriation: No Induration: No Callus: No Crepitus: No Rash: No Scarring: No] Periwound Skin Moisture: No Abnormalities Noted Maceration: Yes Maceration: No Dry/Scaly: No Dry/Scaly: No Periwound Skin Color: No Abnormalities Noted Atrophie Blanche: No Atrophie Blanche: No Cyanosis: No Cyanosis: No Ecchymosis: No Ecchymosis: No Erythema: No Erythema: No Hemosiderin Staining: No Hemosiderin Staining: No Mottled: No Mottled: No Pallor: No Pallor: No Rubor: No Rubor: No Temperature: N/A No Abnormality No Abnormality Tenderness on Palpation: No No No Wound Preparation: N/A Ulcer Cleansing: Ulcer Cleansing: Rinsed/Irrigated with Saline  Rinsed/Irrigated with Saline Topical Anesthetic Applied: Topical Anesthetic Applied: Other: lidocaine 4% Other: lidocaine 4% Wound Number: 2 N/A N/A Photos: No Photos N/A N/A Wound Location: Right Toe Second N/A N/A Wounding Event: Gradually Appeared N/A N/A Primary Etiology: Diabetic Wound/Ulcer of the N/A N/A Lower Extremity Comorbid History: N/A N/A N/A Date Acquired: 06/11/2016 N/A N/A Weeks of [name]: 31 N/A N/A Wound Status: Healed - Epithelialized N/A N/A Pending Amputation on Yes N/A N/A Presentation: Measurements L x W x D 0x0x0 N/A N/A (cm) Area (cm) : 0 N/A N/A Volume (cm) : 0 N/A N/A % Reduction in Area: 100.00% N/A N/A % Reduction in Volume: 100.00% N/A N/A Classification: Grade 2 N/A N/A Exudate Amount: N/A N/A N/A Exudate Type: N/A N/A N/A Exudate Color: N/A N/A N/A Wound Margin: N/A N/A N/A Granulation Amount: N/A N/A N/A Granulation Quality: N/A N/A N/A Necrotic Amount: N/A N/A N/A Exposed Structures: N/A N/A N/A Epithelialization: N/A N/A N/A Periwound Skin Texture: No Abnormalities Noted N/A N/A Periwound Skin Moisture: No Abnormalities Noted N/A N/A Periwound Skin Color: No Abnormalities Noted N/A N/A Temperature: N/A N/A N/A Tenderness on Palpation: No N/A N/A Wound Preparation: N/A N/A N/A [name] Notes Electronic Signature(s) JERIC, SLAGEL ([id number]) Signed: 04/24/2018 4:21:01 PM By: [name] Entered By: [name] on 04/24/2018 08:37:10 Granier, [name] ([id number]) -------------------------------------------------------------------------------- [place] Details Patient Name: [name], [name] Date of Service: 04/24/2018 8:00 AM Medical Record Number: [id number] Patient Account Number: [id number] Date of Birth/Sex: October 18, 1950 (68 y.o. M) Treating RN: [name] Primary Care Jaqua Ching: [name] Other Clinician: Referring Tondra Reierson: [name] Treating Areonna Bran/Extender: [name], [name] Weeks in  [name]: 40 Active Inactive Abuse / Safety / Falls / Self Care Management Nursing Diagnoses: Potential for falls Goals: Patient will remain injury free related to falls Date Initiated: 09/21/2016 Target Resolution Date: 09/14/2017 Goal Status: Active Interventions: Assess fall risk on admission and as needed Notes: Nutrition Nursing Diagnoses: Potential for alteratiion in Nutrition/Potential for imbalanced nutrition Goals: Patient/caregiver agrees to and verbalizes understanding of need to use nutritional supplements and/or vitamins as prescribed Date Initiated: 09/21/2016 Target Resolution Date: 09/14/2017 Goal Status: Active Interventions: Assess patient nutrition upon admission and as needed per policy Notes: Orientation to the Wound Care Program Nursing Diagnoses: Knowledge deficit related to the wound healing center program Goals: Patient/caregiver will verbalize understanding of the [facility] Program Date Initiated: 09/21/2016 Target Resolution Date: 06/15/2017 Goal Status: Active Interventions: Provide education on orientation to the wound center [name], [name] ([id number]) Notes: Wound/Skin Impairment Nursing Diagnoses: Knowledge deficit related to smoking impact on wound healing Goals: Ulcer/skin  breakdown will have a volume reduction of 30% by week 4 Date Initiated: 09/21/2016 Target Resolution Date: 08/17/2017 Goal Status: Active Ulcer/skin breakdown will have a volume reduction of 50% by week 8 Date Initiated: 09/21/2016 Target Resolution Date: 08/17/2017 Goal Status: Active Ulcer/skin breakdown will have a volume reduction of 80% by week 12 Date Initiated: 09/21/2016 Target Resolution Date: 09/14/2017 Goal Status: Active Ulcer/skin breakdown will heal within 14 weeks Date Initiated: 09/21/2016 Target Resolution Date: 08/17/2017 Goal Status: Active Interventions: Assess patient/caregiver ability to obtain necessary supplies Assess patient/caregiver  ability to perform ulcer/skin care regimen upon admission and as needed Assess ulceration(s) every visit Notes: Electronic Signature(s) Signed: 04/24/2018 4:21:01 PM By: [name] Entered By: [name] on 04/24/2018 08:36:49 [name], [name] ([id number]) -------------------------------------------------------------------------------- Pain Assessment Details Patient Name: [name], [name] Date of Service: 04/24/2018 8:00 AM Medical Record Number: [id number] Patient Account Number: [id number] Date of Birth/Sex: 02-19-1951 (68 y.o. M) Treating RN: [name] Primary Care Aura Bibby: [name] Other Clinician: Referring Tykee Heideman: [name] Treating Cashis Rill/Extender: [name], [name]: 38 Active Problems Location of Pain Severity and Description of Pain Patient Has Paino No Site Locations Pain Management and Medication Current Pain Management: Electronic Signature(s) Signed: 04/24/2018 4:16:05 PM By: [name] RCP, RRT, CHT Signed: 04/24/2018 4:21:01 PM By: [name] Entered By: [name] on 04/24/2018 08:09:59 Roskelley, [name] ([id number]) -------------------------------------------------------------------------------- Patient/Caregiver Education Details Patient Name: Billing, Niall [name]. Date of Service: 04/24/2018 8:00 AM Medical Record Number: [id number] Patient Account Number: [id number] Date of Birth/Gender: 01-26-1951 (68 y.o. M) Treating RN: [name] Primary Care Physician: [name] Other Clinician: Referring Physician: Lamonte [name] Treating Physician/Extender: Sharalyn Ink in [name]: 2 Education Assessment Education Provided To: Patient Education Topics Provided Wound/Skin Impairment: Handouts: Caring for Your Ulcer Methods: Demonstration, Explain/Verbal Responses: State content correctly Electronic Signature(s) Signed: 04/24/2018 4:21:01 PM By: [name] Entered By:  [name] on 04/24/2018 08:37:29 [name], [name] EMarland Kitchen ([id number]) -------------------------------------------------------------------------------- Wound Assessment Details Patient Name: Tarpley, Arkel [name]. Date of Service: 04/24/2018 8:00 AM Medical Record Number: [id number] Patient Account Number: [id number] Date of Birth/Sex: 1950-04-26 (68 y.o. M) Treating RN: [name] Primary Care Wesson Stith: [name] Other Clinician: Referring Eola Waldrep: [name] Treating Thara Searing/Extender: [name], [name]: 25 Wound Status Wound Number: 14 Primary Diabetic Wound/Ulcer of the Lower Etiology: Extremity Wound Location: Left Toe Second Wound Status: Healed - Epithelialized Wounding Event: Gradually Appeared Date Acquired: 09/26/2017 Weeks Of [name]: 30 Clustered Wound: No Photos Photo Uploaded By: [name] on 04/24/2018 08:50:19 Wound Measurements Length: (cm) 0 % Width: (cm) 0 % Depth: (cm) 0 Area: (cm) 0 Volume: (cm) 0 Reduction in Area: 100% Reduction in Volume: 100% Wound Description Classification: Grade 1 Periwound Skin Texture Texture Color No Abnormalities Noted: No No Abnormalities Noted: No Moisture No Abnormalities Noted: No Electronic Signature(s) Signed: 04/24/2018 4:56:33 PM By: [name] Entered By: [name] on 04/24/2018 08:15:32 [name], [name] ([id number]) -------------------------------------------------------------------------------- Wound Assessment Details Patient Name: Lazcano, Everest [name]. Date of Service: 04/24/2018 8:00 AM Medical Record Number: [id number] Patient Account Number: [id number] Date of Birth/Sex: 1951-01-24 (68 y.o. M) Treating RN: [name] Primary Care Jolly Bleicher: [name] Other Clinician: Referring Aymen Widrig: [name] Treating Derrin Currey/Extender: [name], [name]: 70 Wound Status Wound Number: 16 Primary Diabetic Wound/Ulcer of the Lower Extremity Etiology: Wound  Location: Right Toe Great - Plantar Wound Open Wounding Event: Not Known Status: Date Acquired: 10/29/2017 Comorbid Anemia, Lymphedema, Congestive Heart Weeks Of [name]: 25  History: Failure, Hypertension, Peripheral Venous Clustered Wound: No Disease, Type II Diabetes, Neuropathy Photos Photo Uploaded By: [name] on 04/24/2018 08:50:20 Wound Measurements Length: (cm) 1.3 Width: (cm) 0.7 Depth: (cm) 0.1 Area: (cm) 0.715 Volume: (cm) 0.071 % Reduction in Area: 55.4% % Reduction in Volume: 55.6% Epithelialization: None Tunneling: No Undermining: No Wound Description Classification: Grade 1 Wound Margin: Flat and Intact Exudate Amount: Medium Exudate Type: Serous Exudate Color: amber Foul Odor After Cleansing: No Slough/Fibrino Yes Wound Bed Granulation Amount: Large (67-100%) Exposed Structure Granulation Quality: Pink Fascia Exposed: No Necrotic Amount: Small (1-33%) Fat Layer (Subcutaneous Tissue) Exposed: Yes Necrotic Quality: Adherent Slough Tendon Exposed: No Muscle Exposed: No Joint Exposed: No Bone Exposed: No Periwound Skin Texture Snodgrass, [name] ([id number]) Texture Color No Abnormalities Noted: No No Abnormalities Noted: No Callus: No Atrophie Blanche: No Crepitus: No Cyanosis: No Excoriation: No Ecchymosis: No Induration: No Erythema: No Rash: No Hemosiderin Staining: No Scarring: No Mottled: No Pallor: No Moisture Rubor: No No Abnormalities Noted: No Dry / Scaly: No Temperature / Pain Maceration: Yes Temperature: No Abnormality Wound Preparation Ulcer Cleansing: Rinsed/Irrigated with Saline Topical Anesthetic Applied: Other: lidocaine 4%, Electronic Signature(s) Signed: 04/24/2018 4:56:33 PM By: [name] Entered By: [name] on 04/24/2018 08:18:39 Calvo, [name] ([id number]) -------------------------------------------------------------------------------- Wound Assessment Details Patient Name: Haith, Tynan  [name]. Date of Service: 04/24/2018 8:00 AM Medical Record Number: [id number] Patient Account Number: [id number] Date of Birth/Sex: 1950-10-14 (68 y.o. M) Treating RN: [name] Primary Care Naria Abbey: [name] Other Clinician: Referring Dashea Mcmullan: [name] Treating Demarie Uhlig/Extender: [name], [name]: 6 Wound Status Wound Number: 17 Primary Diabetic Wound/Ulcer of the Lower Extremity Etiology: Wound Location: Right Metatarsal head second - Plantar, Distal Wound Open Status: Wounding Event: Trauma Comorbid Anemia, Lymphedema, Congestive Heart Date Acquired: 02/03/2018 History: Failure, Hypertension, Peripheral Venous Weeks Of [name]: 11 Disease, Type II Diabetes, Neuropathy Clustered Wound: No Photos Photo Uploaded By: [name] on 04/24/2018 08:51:05 Wound Measurements Length: (cm) 0.8 Width: (cm) 0.3 Depth: (cm) 0.1 Area: (cm) 0.188 Volume: (cm) 0.019 % Reduction in Area: 46.7% % Reduction in Volume: 45.7% Epithelialization: None Tunneling: No Undermining: No Wound Description Classification: Grade 1 Wound Margin: Flat and Intact Exudate Amount: Medium Exudate Type: Serous Exudate Color: amber Foul Odor After Cleansing: No Slough/Fibrino Yes Wound Bed Granulation Amount: Large (67-100%) Exposed Structure Granulation Quality: Pink Fascia Exposed: No Necrotic Amount: Small (1-33%) Fat Layer (Subcutaneous Tissue) Exposed: Yes Necrotic Quality: Adherent Slough Tendon Exposed: No Muscle Exposed: No Joint Exposed: No Bone Exposed: No Periwound Skin Texture Kovich, Marquez [name]. ([id number]) Texture Color No Abnormalities Noted: No No Abnormalities Noted: No Callus: No Atrophie Blanche: No Crepitus: No Cyanosis: No Excoriation: No Ecchymosis: No Induration: No Erythema: No Rash: No Hemosiderin Staining: No Scarring: No Mottled: No Pallor: No Moisture Rubor: No No Abnormalities Noted: No Dry / Scaly: No Temperature /  Pain Maceration: No Temperature: No Abnormality Wound Preparation Ulcer Cleansing: Rinsed/Irrigated with Saline Topical Anesthetic Applied: Other: lidocaine 4%, Electronic Signature(s) Signed: 04/24/2018 4:56:33 PM By: [name] Entered By: [name] on 04/24/2018 08:19:01 Traynham, [name] ([id number]) -------------------------------------------------------------------------------- Wound Assessment Details Patient Name: Dugger, Gurtaj [name]. Date of Service: 04/24/2018 8:00 AM Medical Record Number: [id number] Patient Account Number: [id number] Date of Birth/Sex: June 25, 1950 (68 y.o. M) Treating RN: [name] Primary Care Jameison Haji: [name] Other Clinician: Referring Esley Brooking: [name] Treating Bryann Mcnealy/Extender: [name], [name]: 55 Wound Status Wound Number: 2 Primary Diabetic Wound/Ulcer of the Lower Etiology: Extremity Wound  Location: Right Toe Second Wound Status: Healed - Epithelialized Wounding Event: Gradually Appeared Date Acquired: 06/11/2016 Weeks Of [name]: 82 Clustered Wound: No Pending Amputation On Presentation Photos Photo Uploaded By: [name] on 04/24/2018 08:51:05 Wound Measurements Length: (cm) 0 % Width: (cm) 0 % Depth: (cm) 0 Area: (cm) 0 Volume: (cm) 0 Reduction in Area: 100% Reduction in Volume: 100% Wound Description Classification: Grade 2 Periwound Skin Texture Texture Color No Abnormalities Noted: No No Abnormalities Noted: No Moisture No Abnormalities Noted: No Electronic Signature(s) Signed: 04/24/2018 4:56:33 PM By: [name] Entered By: [name] on 04/24/2018 08:15:33 Mokry, [name] ([id number]) -------------------------------------------------------------------------------- Vitals Details Patient Name: Clendenin, Otilio [name]. Date of Service: 04/24/2018 8:00 AM Medical Record Number: [id number] Patient Account Number: [id number] Date of Birth/Sex: 1951-03-09 (68 y.o. M) Treating RN:  [name] Primary Care Editha Bridgeforth: [name] Other Clinician: Referring Latrail Pounders: [name] Treating Emerly Prak/Extender: [name], [name]: 82 Vital Signs Time Taken: 08:10 Temperature (F): 98.2 Height (in): 69 Pulse (bpm): 73 Weight (lbs): 168 Respiratory Rate (breaths/min): 16 Body Mass Index (BMI): 24.8 Blood Pressure (mmHg): 168/82 Reference Range: 80 - 120 mg / dl Electronic Signature(s) Signed: 04/24/2018 4:16:05 PM By: [name] RCP, RRT, CHT Entered By: [name] on 04/24/2018 08:12:49

## 2018-05-08 ENCOUNTER — Encounter: Payer: Medicare HMO | Admitting: Physician Assistant

## 2018-05-08 DIAGNOSIS — E11621 Type 2 diabetes mellitus with foot ulcer: Secondary | ICD-10-CM | POA: Diagnosis not present

## 2018-05-21 ENCOUNTER — Ambulatory Visit: Payer: Medicare HMO | Admitting: Surgery

## 2018-05-22 ENCOUNTER — Ambulatory Visit: Payer: Medicare HMO | Admitting: General Surgery

## 2018-05-22 ENCOUNTER — Inpatient Hospital Stay: Payer: Medicare Other

## 2018-05-22 ENCOUNTER — Other Ambulatory Visit: Payer: Self-pay | Admitting: Oncology

## 2018-05-22 ENCOUNTER — Ambulatory Visit (INDEPENDENT_AMBULATORY_CARE_PROVIDER_SITE_OTHER): Payer: Medicare Other | Admitting: General Surgery

## 2018-05-22 ENCOUNTER — Other Ambulatory Visit: Payer: Self-pay

## 2018-05-22 ENCOUNTER — Encounter: Payer: Self-pay | Admitting: General Surgery

## 2018-05-22 ENCOUNTER — Inpatient Hospital Stay: Payer: Medicare Other | Attending: Oncology

## 2018-05-22 ENCOUNTER — Encounter: Payer: Medicare Other | Attending: Physician Assistant | Admitting: Physician Assistant

## 2018-05-22 ENCOUNTER — Other Ambulatory Visit: Payer: Self-pay | Admitting: *Deleted

## 2018-05-22 VITALS — BP 163/82 | HR 69 | Resp 18

## 2018-05-22 VITALS — BP 176/87 | HR 73 | Temp 97.5°F | Resp 16 | Ht 72.0 in | Wt 188.0 lb

## 2018-05-22 DIAGNOSIS — F17218 Nicotine dependence, cigarettes, with other nicotine-induced disorders: Secondary | ICD-10-CM | POA: Diagnosis not present

## 2018-05-22 DIAGNOSIS — N189 Chronic kidney disease, unspecified: Secondary | ICD-10-CM | POA: Insufficient documentation

## 2018-05-22 DIAGNOSIS — Z862 Personal history of diseases of the blood and blood-forming organs and certain disorders involving the immune mechanism: Secondary | ICD-10-CM

## 2018-05-22 DIAGNOSIS — I87323 Chronic venous hypertension (idiopathic) with inflammation of bilateral lower extremity: Secondary | ICD-10-CM | POA: Insufficient documentation

## 2018-05-22 DIAGNOSIS — C44519 Basal cell carcinoma of skin of other part of trunk: Secondary | ICD-10-CM

## 2018-05-22 DIAGNOSIS — D472 Monoclonal gammopathy: Secondary | ICD-10-CM | POA: Insufficient documentation

## 2018-05-22 DIAGNOSIS — M7989 Other specified soft tissue disorders: Secondary | ICD-10-CM | POA: Diagnosis not present

## 2018-05-22 DIAGNOSIS — M86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, right ankle and foot: Secondary | ICD-10-CM | POA: Diagnosis not present

## 2018-05-22 DIAGNOSIS — D649 Anemia, unspecified: Secondary | ICD-10-CM

## 2018-05-22 DIAGNOSIS — D631 Anemia in chronic kidney disease: Secondary | ICD-10-CM | POA: Insufficient documentation

## 2018-05-22 DIAGNOSIS — E11621 Type 2 diabetes mellitus with foot ulcer: Secondary | ICD-10-CM | POA: Diagnosis not present

## 2018-05-22 DIAGNOSIS — F17210 Nicotine dependence, cigarettes, uncomplicated: Secondary | ICD-10-CM | POA: Insufficient documentation

## 2018-05-22 DIAGNOSIS — L97511 Non-pressure chronic ulcer of other part of right foot limited to breakdown of skin: Secondary | ICD-10-CM | POA: Insufficient documentation

## 2018-05-22 DIAGNOSIS — I130 Hypertensive heart and chronic kidney disease with heart failure and stage 1 through stage 4 chronic kidney disease, or unspecified chronic kidney disease: Secondary | ICD-10-CM | POA: Diagnosis present

## 2018-05-22 DIAGNOSIS — Z79899 Other long term (current) drug therapy: Secondary | ICD-10-CM | POA: Diagnosis not present

## 2018-05-22 DIAGNOSIS — L97521 Non-pressure chronic ulcer of other part of left foot limited to breakdown of skin: Secondary | ICD-10-CM | POA: Diagnosis not present

## 2018-05-22 DIAGNOSIS — D509 Iron deficiency anemia, unspecified: Secondary | ICD-10-CM

## 2018-05-22 DIAGNOSIS — I890 Lymphedema, not elsewhere classified: Secondary | ICD-10-CM | POA: Diagnosis not present

## 2018-05-22 LAB — CBC
HEMATOCRIT: 22.4 % — AB (ref 39.0–52.0)
Hemoglobin: 7 g/dL — ABNORMAL LOW (ref 13.0–17.0)
MCH: 28.1 pg (ref 26.0–34.0)
MCHC: 31.3 g/dL (ref 30.0–36.0)
MCV: 90 fL (ref 80.0–100.0)
Platelets: 214 10*3/uL (ref 150–400)
RBC: 2.49 MIL/uL — ABNORMAL LOW (ref 4.22–5.81)
RDW: 13.2 % (ref 11.5–15.5)
WBC: 6.8 10*3/uL (ref 4.0–10.5)
nRBC: 0 % (ref 0.0–0.2)

## 2018-05-22 LAB — PREPARE RBC (CROSSMATCH)

## 2018-05-22 MED ORDER — EPOETIN ALFA-EPBX 40000 UNIT/ML IJ SOLN: 50000.0000 [IU] | Freq: Once | INTRAMUSCULAR | Status: DC

## 2018-05-22 MED ORDER — EPOETIN ALFA-EPBX 10000 UNIT/ML IJ SOLN
10000.0000 [IU] | Freq: Once | INTRAMUSCULAR | Status: AC
  Filled 2018-05-22: qty 1

## 2018-05-22 MED ORDER — EPOETIN ALFA-EPBX 40000 UNIT/ML IJ SOLN
40000.0000 [IU] | Freq: Once | INTRAMUSCULAR | Status: DC
  Filled 2018-05-22: qty 1

## 2018-05-22 NOTE — Patient Instructions (Addendum)
Patient is to return to the office in 4 months.   Call the office with any questions or concerns.

## 2018-05-22 NOTE — Progress Notes (Signed)
Patient ID: [name], male   DOB: 25-Dec-1950, 68 y.o.   MRN: [id number]  Chief Complaint  Patient presents with  . Follow-up     f/u for Basal Cell Ca, gluteal flap Sx 12/16/17,     HPI [name] is a 68 y.o. male here today to follow up for basal cell cancer. Patient states he is feeling a little tired this morning he is here today with [name] his brother in law.   HPI  Past Medical History:  Diagnosis Date  . Anemia   . Cancer ([place]) 11/02/2014   PERIANAL MASS, RIGHT; INCISIONAL BIOPSY: INVASIVE CARCINOMA WITH BASALOID FEATURES  . CHF (congestive heart failure) ([place])   . Chronic kidney disease   . COPD (chronic obstructive pulmonary disease) ([place])    NO INHALERS  . Diabetes mellitus    NO MEDS  . GERD (gastroesophageal reflux disease)   . Hepatic cirrhosis ([place])   . Hypertension   . Leg swelling   . Loose, teeth    PATIENT STATES "HAS 6 TEETH, SOME ARE LOOSE"  . Neuromuscular disorder ([place])    RIGHT HAND AND FINGERS NUMB  . Shortness of breath dyspnea    WITH EXERTION   . 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 APPENDECTOMY    . COLONOSCOPY WITH PROPOFOL N/A 11/19/2014   Procedure: COLONOSCOPY WITH PROPOFOL;  Surgeon: Lucilla Lame, MD;  Location: [facility];  Service: Endoscopy;  Laterality: N/A;  DIABETIC-ORAL MEDS  . COLONOSCOPY WITH PROPOFOL N/A 10/04/2016   Procedure: COLONOSCOPY WITH PROPOFOL;  Surgeon: Jonathon Bellows, MD;  Location[facility] ENDOSCOPY;  Service: Endoscopy;  Laterality: N/A;  . ESOPHAGOGASTRODUODENOSCOPY (EGD) WITH PROPOFOL N/A 10/04/2016   Procedure: ESOPHAGOGASTRODUODENOSCOPY (EGD) WITH PROPOFOL;  Surgeon: Jonathon Bellows, MD;  Location: [facility] ENDOSCOPY;  Service: Endoscopy;  Laterality: N/A;  . GIVENS CAPSULE STUDY N/A 11/13/2016   Procedure: GIVENS CAPSULE STUDY;  Surgeon: Jonathon Bellows, MD;  Location: [facility] ENDOSCOPY;  Service: Gastroenterology;  Laterality: N/A;  . INCISION AND  DRAINAGE PERIRECTAL ABSCESS Right 12/16/2017   Procedure: EXCISION BASAL CELL CANCER/ GLUTEAL ABSCESS AND FLAP COVERAGE;  Surgeon: Robert Bellow, MD;  Location: [facility] ORS;  Service: General;  Laterality: Right;  . INGUINAL LYMPH NODE BIOPSY Right 01/26/2016   Procedure: INGUINAL LYMPH NODE BIOPSY;  Surgeon: Clayburn Pert, MD;  Location: [facility] ORS;  Service: General;  Laterality: Right;  . RECTAL BIOPSY N/A 11/02/2014   Procedure: BIOPSY RECTAL;  Surgeon: Marlyce Huge, MD;  Location: [facility] ORS;  Service: General;  Laterality: N/A;  . RECTAL EXAM UNDER ANESTHESIA N/A 11/02/2014   Procedure: RECTAL EXAM UNDER ANESTHESIA;  Surgeon: Marlyce Huge, MD;  Location: [facility] ORS;  Service: General;  Laterality: N/A;  . TONSILLECTOMY      Family History  Problem Relation Age of Onset  . Asthma Mother   . Colon cancer Neg Hx     Social History Social History   Tobacco Use  . Smoking status: Current Every Day Smoker    Packs/day: 0.25    Years: 20.00    Pack years: 5.00    Types: Cigarettes  . Smokeless tobacco: Never Used  . Tobacco comment: 3-4 cigarettes per day  Substance Use Topics  . Alcohol use: Not Currently  . Drug use: Not Currently    Comment: Last Use 2016 per patient-+ UDS FOR COCAINE IN 2014    No Known Allergies  Current Outpatient Medications  Medication Sig Dispense Refill  . amLODipine (NORVASC) 10 MG tablet Take 10 mg by mouth 2 (two) times daily.     Marland Kitchen atorvastatin (LIPITOR) 40 MG tablet Take 40 mg by mouth every morning.     . bismuth subsalicylate (PEPTO BISMOL) 262 MG/15ML suspension Take 30 mLs by mouth every 6 (six) hours as needed for indigestion or diarrhea or loose stools.     . docusate sodium (COLACE) 100 MG capsule Take 1 capsule (100 mg total) by mouth 2 (two) times daily. 10 capsule 0  . ferrous sulfate 325 (65 FE) MG tablet Take 325 mg by mouth daily.     . hydrALAZINE (APRESOLINE) 25 MG tablet Take 25 mg 2 (two) times daily by mouth.     Marland Kitchen omeprazole (PRILOSEC) 40 MG capsule Take 40 mg by mouth every morning.     . polyethylene glycol (MIRALAX / GLYCOLAX) packet Take 17 g by mouth daily as needed for mild constipation.     . silver sulfADIAZINE (SILVADENE) 1 % cream Apply to affected area twice daily 400 g 1  . sodium bicarbonate 650 MG tablet Take 1 tablet (650 mg total) by mouth 3 (three) times daily. 20 tablet 0  . vitamin B-12 (CYANOCOBALAMIN) 1000 MCG tablet Take 1 tablet (1,000 mcg total) by mouth daily. 30 tablet 3  . Vitamin D, Ergocalciferol, (DRISDOL) 50000 units CAPS capsule Take 50,000 Units by mouth every Tuesday.      No current facility-administered medications for this visit.    Facility-Administered Medications Ordered in Other Visits  Medication Dose Route Frequency Provider Last Rate Last Dose  . epoetin alfa-epbx (RETACRIT) injection 10,000 Units  10,000 Units Subcutaneous Once Sindy Guadeloupe, MD        Review of Systems Review of Systems  Constitutional: Negative.   Respiratory: Negative.   Cardiovascular: Negative.     Blood pressure (!) 176/87, pulse 73, temperature (!) 97.5 F (36.4 C), temperature source Temporal, resp. rate 16, height 6' (1.829 m), weight 188 lb (85.3 kg), SpO2 97 %.  Physical Exam Physical Exam Constitutional:      Appearance: He is well-developed.  Eyes:     General: No scleral icterus.    Conjunctiva/sclera: Conjunctivae normal.  Neck:     Musculoskeletal: Normal range of motion.  Cardiovascular:     Rate and Rhythm: Normal rate and regular rhythm.     Heart sounds: Normal heart sounds.  Pulmonary:     Effort: Pulmonary effort is normal.     Breath sounds: Examination of the right-lower field reveals rhonchi. Rhonchi present.  Chest:     Breasts: Breasts are symmetrical.        Right: No inverted nipple, mass, nipple discharge, skin change or tenderness.        Left: No inverted nipple, mass, nipple discharge, skin change or tenderness.  Lymphadenopathy:      Cervical: No cervical adenopathy.  Skin:    General: Skin is warm and dry.  Neurological:     Mental Status: He is alert and oriented to person, place, and time.     Data Reviewed The gluteal wound measures 1.5 x 2.7 cm.  Clean granulation tissue.  At the time of the March 11, 2018 visit the area measured 2.0 x 4.5 cm.  Assessment    Steady improvement in gluteal wound with conservative wound care.    Plan The patient will continue present wound management.  Follow-up in 4 months for likely final exam.  HPI, Physical  Exam, Assessment and Plan have been scribed under the direction and in the presence of Hervey Ard, Md.  Eudelia Bunch R. Bobette Mo, CMA  I have completed the exam and reviewed the above documentation for accuracy and completeness.  I agree with the above.  Haematologist has been used and any errors in dictation or transcription are unintentional.  Hervey Ard, M.D., F.A.C.S. [place]  05/23/2018, 5:40 AM

## 2018-05-23 ENCOUNTER — Ambulatory Visit
Admission: RE | Admit: 2018-05-23 | Discharge: 2018-05-23 | Disposition: A | Discharge status: Home / Self Care | Payer: Medicare Other | Source: Ambulatory Visit | Attending: Oncology | Admitting: Oncology

## 2018-05-23 DIAGNOSIS — D649 Anemia, unspecified: Secondary | ICD-10-CM | POA: Insufficient documentation

## 2018-05-23 DIAGNOSIS — I130 Hypertensive heart and chronic kidney disease with heart failure and stage 1 through stage 4 chronic kidney disease, or unspecified chronic kidney disease: Secondary | ICD-10-CM | POA: Diagnosis not present

## 2018-05-23 LAB — PREPARE RBC (CROSSMATCH)

## 2018-05-23 MED ORDER — HEPARIN SOD (PORK) LOCK FLUSH 100 UNIT/ML IV SOLN: 500.0000 [IU] | Freq: Every day | INTRAVENOUS | Status: DC | PRN

## 2018-05-23 MED ORDER — ACETAMINOPHEN 325 MG PO TABS
ORAL_TABLET | ORAL | Status: AC
  Filled 2018-05-23: qty 2

## 2018-05-23 MED ORDER — SODIUM CHLORIDE 0.9% IV SOLUTION
250.0000 mL | Freq: Once | INTRAVENOUS | Status: AC
  Administered 2018-05-23: 500 mL via INTRAVENOUS

## 2018-05-23 MED ORDER — SODIUM CHLORIDE 0.9% FLUSH: 3.0000 mL | INTRAVENOUS | Status: DC | PRN

## 2018-05-23 MED ORDER — HEPARIN SOD (PORK) LOCK FLUSH 100 UNIT/ML IV SOLN: 250.0000 [IU] | INTRAVENOUS | Status: DC | PRN

## 2018-05-23 MED ORDER — SODIUM CHLORIDE 0.9% FLUSH: 10.0000 mL | INTRAVENOUS | Status: DC | PRN

## 2018-05-23 MED ORDER — ACETAMINOPHEN 325 MG PO TABS
650.0000 mg | ORAL_TABLET | Freq: Once | ORAL | Status: AC
  Administered 2018-05-23: 650 mg via ORAL

## 2018-05-24 LAB — TYPE AND SCREEN
ABO/RH(D): A POS
Antibody Screen: NEGATIVE
UNIT DIVISION: 0

## 2018-05-24 LAB — BPAM RBC
Blood Product Expiration Date: 202003062359
ISSUE DATE / TIME: 202002140953
Unit Type and Rh: 6200

## 2018-05-26 NOTE — Progress Notes (Signed)
FADY, STAMPS ([id number]) Visit Report for 05/22/2018 Chief Complaint Document Details Patient Name: [name], [name] WIDMAYER. Date of Service: 05/22/2018 8:30 AM Medical Record Number: [id number] Patient Account Number: [id number] Date of Birth/Sex: [birth year] (68 y.o. M) Treating RN: Harold Barban Primary Care Provider: Lamonte Sakai Other Clinician: Referring Provider: Lamonte Sakai Treating Provider/Extender: Melburn Hake, HOYT Weeks in Treatment: 91 Information Obtained from: Patient Chief Complaint Patient presents for treatment of an open diabetic ulcer to both feet Electronic Signature(s) Signed: 05/23/2018 8:42:52 AM By: Worthy Keeler PA-C Entered By: Worthy Keeler on 05/22/2018 08:57:08 Armato, [name] ([id number]) -------------------------------------------------------------------------------- HPI Details Patient Name: Rogstad, [name] Date of Service: 05/22/2018 8:30 AM Medical Record Number: [id number] Patient Account Number: [id number] Date of Birth/Sex: [birth year] (68 y.o. M) Treating RN: Harold Barban Primary Care Provider: Lamonte Sakai Other Clinician: Referring Provider: Lamonte Sakai Treating Provider/Extender: Melburn Hake, HOYT Weeks in Treatment: 27 History of Present Illness HPI Description: 68 year old patient here to see as for bilateral feet ulceration to on his left first and second toe and 2 on his right first and second toe, which she's had for about 4 months. 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 Donella Stade. The patient is also undergoing workup by medical oncology for a intra-abdominal lymphadenopathy. 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 He is also status post appendectomy, inguinal lymph node biopsy, rectal biopsy  and rectal examination under anesthesia. he currently smokes cigarettes about half packet a day. In March of this year he was seen by Dr. Hortencia Pilar, for evaluation of bilateral varicose veins and besides wearing compression stockings he had recommended laser ablation of the right and left great saphenous veins to eleviate the symptoms and complications of severe superficial venous reflux disease. He also recommended lymphedema pumps for better control of his lymphedema. The patient recently has had on 08/23/2016, right greater saphenous vein ablation with the laser energy Earlier lower extremity venous reflux examination done on 05/08/2016 showed no DVT or SVT both lower legs but incompetence of bilateral great saphenous veins was present. A lower arterial study was also done and there was no significant right lower and left lower extremity problems based on a normal toe brachial index bilaterally and the ABI was 1.21 the left and 1.23 on the right. His post ablation venous duplex examination showed successful ablation of the right GS vein with thrombus formation 2 below the right saphenofemoral junction. The deep system was patent without evidence of thrombosis and this was done on 08/30/2016. 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 Addendum: regarding his x-rays done today and x-ray of the left foot -- IMPRESSION: No objective evidence of osteomyelitis. There are soft tissue changes which may reflect cellulitis. X-ray of the right foot -- IMPRESSION:Findings compatible with cellulitis of the toes. No objective evidence of osteomyelitis is observed. 10/01/16 on evaluation today patient's wounds appeared to be doing some better. I did review the x-rays as well which showed no evidence of osteomyelitis although there was evidence on x-ray of cellulitis. He fortunately is not  having any discomfort although he continues to have some swelling. He does not remember being on any antibiotics  recently. 10/15/16 on evaluation today patient's wounds overall appear to be doing better although he does have a new location noted on the left foot. Fortunately he is not having significant pain. It almost has the appearance that something is rubbing on the end of his toes but he wears the open toe shoes and according to what he is telling me never wears anything that would rub on his foot. There is no evidence of infection and specifically no evidence of a fungal infection 10/22/16 On evaluation today patient's wounds appeared to be doing better compared to last week in regard to his bilateral lower extremities. Fortunately I happy with how things are progressing although he still has ulcers I feel like that he is improving and appropriate manner. 11/12/16 on evaluation today patient appears to be doing well in regard to his bilateral feet and the respective wounds. We have been using surrounding her dressings along with an antifungal cream which seems to be doing very well. He has no bilateral dysfunction noticed that the rituals are weight loss at this point. He also has no nausea or vomiting a note purulent discharge. He did see Vein and vascular today and he tells me that they told him he could have surgery for his venous stasis but they did not feel like it was worth it in his words. Fortunately patient's wounds do appear to be getting sneakily better. [name], [name] ([id number]) 11/26/2016 -- he says he is going to have some surgery during this week at [facility] for possibly a colon resection. 12/31/2016 -- the patient has been noncompliant with his smoking and I'm not sure whether he is also started drinking again. He continues to be very nonchalant about his care 01/14/2017 -- the patient's HandP has been reviewed well and I understand he is being compliant with trying to give  up smoking and his local dressing changes. He does not have any surgical options of 4 to him by his vascular surgeons.he was last seen in early August by Dr. Hortencia Pilar who recommended compression stockings,and possibly lymph pumps in 2-3 months after doing a review ultrasound. 01/28/2017 - the patient did not have any fresh complaints but on examination I noted a large lacerated wound on the plantar aspect of his right fourth toe which had a lot of necrotic debris and it probes down to bone. 02/07/2017 -- x-ray of the right foot -- IMPRESSION: Soft tissue swelling about the first through fourth toes consistent with cellulitis. New destructive change in the tuft of the distal phalanx of the great toe is consistent with osteomyelitis. 02/14/2017 -- the patient's MRI is pending this coming Monday and he still continues to smoke. We have again gone over off loading of his wounds in great detail and he says he's been compliant. 02/21/2017 -- MR of the right foot -- IMPRESSION: 1. Soft tissue ulcer at the tip of the first, second and third toe knows. Cortical irregularity and bone marrow edema in the first distal phalanx most concerning for osteomyelitis. Mild marrow edema in the second and third distal phalanx without definite cortical destruction which may reflect early osteomyelitis versus reactive marrow edema. 2. Soft tissue edema surrounding the first phalanx most consistent with cellulitis. the patient was also recently evaluated by his medical oncologist Dr. 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 She is treating him with weekly Procrit. She is also keeping intra-abdominal lymphadenopathy and right lower  lobe lung nodule under observation. 04/04/2017 -- he was seen by Dr. Adrian Prows on 03/25/2017 -- after review he empirically put him on ciprofloxacin and doxycycline as they have good bone penetration and good  bioavailability and it will cover the usual pathogens and diabetic foot osteomyelitis. He will check inflammatory markers and plan a 67-89 week old records. C-reactive protein was 0.3 and the ESR was 72 04/18/17 on evaluation today patient appears to be doing about the same in regard to his lower extremity wounds bilaterally. He has continued to use the antifungal cream which does seem to be beneficial. Nonetheless the ulcers do seem to in some areas be epithelial eyes over and in other areas are still open. He is having no significant discomfort. 04/25/17-he is here in follow-up evaluation for multiple ulcerations to multiple toes bilaterally. He states he did see Dr. Ola Spurr again last week and continues antibiotic therapy. He is voicing no complaints or concerns, will continue with current treatment plan will possibility of adding compression therapy next week after an additional week of treatment/lotions to BLE prescribed by Dr Ola Spurr 05/02/17 he is here in follow up for for multiple ulcers to multiple toes bilaterally. we will stop using antifungal cream and will continue with silvercel and follow up next week 05/09/17-he is here in follow-up for multiple ulcerations to multiple toes bilaterally. There is improvement in appearance. He has not completely stopped using antifungal cream, but admits he has not using it between the toes. He has an appointment with Dr. Ola Spurr on 2/11, continues on doxycycline and Cipro. It has been 5 weeks of antibiotic therapy, we will order plain film xray to evaluate for osteomyelitis next week, prior to follow up with ID. Will continue with silvercel and follow up next week 05/16/17-he is here in follow-up evaluation for multiple ulcerations to multiple toes bilaterally and new wound to the right posterior heel. There is essentially no change in appearance, deteriorating measurements; he has a history of waxing and waning measurements. He admits that he  continues to apply moisturizer/cream/ointment to his toes despite weekly reminders to only apply silvercel to his toes. He states that he thinks the surgical shoe contributed to the superficial ulcer to his posterior heel, he is unable to articulate if this was an area of dry cracked skin as he has a similar area to the left heel. He now is wearing open toed slippers. He has an appointment with Dr. Ola Spurr on 2/11. We have ordered x-rays for her bilateral feet; he was advised to obtain the x-rays today or tomorrow. He will follow-up next week 05/23/17-he is here in follow-up evaluation for multiple ulcerations to multiple toes bilaterally and the right posterior heel. There is improvement in maceration. He has been compliant and not applying any moisturizing agent to his toes. He has been using Lac-Hydrin for his lower extremities with improvement. He did not go to his appointment on Monday with Dr. Ola Spurr secondary to financial concerns. X-rays for her bilateral feet showed: LEFT FOOT with slight erosion of the tuft of the distal phalanges of the left first and second toe suspicious for osteomyelitis, RIGHT FOOT with 1.erosion of the tufts of the distal Stevison, Rahil E. ([id number]) phalanges of the right first second and possibly third toes consistent with osteomyelitis, 2 no definitive abnormality of the calcaneus is seen on the images obtained, 3. Plantar calcaneal degenerative spur. We briefly discussed hyperbaric adjunctive therapy for treatment of chronic refractory osteomyelitis. I do not find an a1c in  EMR, will contact PCP for record, or order if needed. He has been encouraged to contact Dr Ola Spurr office regarding the follow-up appointment, encouraged him to inquire about payment plan. We will continue with same treatment plan and follow-up next week. He states he is still taking antibiotics and has "a lot" left. He states he has been taking them as directed, 2 pills twice daily.  According to Dr. Blane Ohara office notes he was originally started on 12/17 for 4 weeks and extended on 1/14 for an additional 4 weeks. He should be done with his antibiotic therapy, he was advised to bring his bottles and to his next appointment, we will contact pharmacy. 05/30/17-he is here in follow-up evaluation for multiple ulcerations to multiple toes bilaterally and the right posterior heel. He is accompanied by his brother-in-law. Wounds are stable. He has yet to make up with Dr. Ola Spurr. We contacted his PCP, with no record of recent A1c we will draw an A1c. His brother-in-law states that he was taken off all of his diabetic medication secondary to kidney function. 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 The antibiotics were counted and have approximately 2 weeks left, although they should be complete. The pill organizer reveals missing days. We discussed the need for consistent medications, to have optimal benefit of medication. He has a cousin that lives with him and he will ask her to check his organizer daily. He has been advised to follow up with Dr Ola Spurr, and will go by the office today. He has been advised to quit smoking. 06/06/17-he is here in follow up evaluation. He has had to make an appointment with Dr. Ola Spurr. He did have blood work obtained, a1c 5. He continues to take antibiotic therapy. Significant improvement in bilateral lower extremity edema with compression therapy. Essentially no change in ulcerations to toes. He states he is "going to try something different" and "let me know next week" if it works; he would not provide any additional information and was encouraged to follow our orders. We will follow up next week 06/13/17-he is here in follow-up evaluation. He has an appointment with Dr. Ola Spurr tomorrow morning. He states he purchased an ointment from  [facility] and applied to his toes for 3 days, he does not remember the name of the ointment. There is improvement to his wounds, minimal maceration. He continues to take antibiotic therapy, this should have been completed last month. His brother-in-law who regularly accompanies his appointments was asked to take the bottles to the appointment tomorrow with Dr. Ola Spurr so he is aware. We will continue with 3 layer compression, and order OPEN TOE compression 20-30mmHg; we will apply compression stockings next week. He continues to smoke, smoked "2 cigarettes" last week 06/20/17 on evaluation today patient did receive his compression stockings which he has with him today for both lower extremities. With that being said he tells me at this point in time that he is very happy to have these he really is not a big fan of the compression wraps that we have been utilizing although they have been of great benefit for him. Nonetheless at this point he does want to switch to the compression stockings. In my opinion as long as he is continuing with compression I'm okay with the stockings or the wraps. 06/27/17-he is here in follow-up evaluation for multiple ulcerations to his bilateral toes. There is some improvement in appearance. He is compliant  in wearing his compression stockings with significant improvement in lower extremity edema. He saw Dr Ola Spurr on 3/8, per his notes they would redraw ESR and CRP; plan to continue antibiotic therapy if these remain elevated. I do not see an ESR or CRP level in Epic. The patient continues to take antibiotics. 07/11/17-He is here in follow-up evaluation for multiple ulcerations to multiple toes bilaterally. He presents with complete epithelialization to the right third toe; there has been no deterioration. He continues on antibiotic therapy. He will follow-up next week 07/18/17-He is here in follow-up evaluation for multiple ulcerations to multiple toes bilaterally. He  continues to make improvement. He continues on antibiotic therapy. He states he has been using something additional to our orders, he does not elaborate but states he will bring it in next week. 07/25/17-He is here in follow up evaluation for multiple ulcerations to bilateral toes. He is stable. He has completed antibiotic therapy. He admits to "filing" his toes after showers each evening, this is what he was referencing last week; he does not filing for the wounds. We will switch to [facility] and monitor for any improvement, he will follow-up next week 08/01/17-He is here in follow-up evaluation. He admits to "picking" at his toes after cleansing yesterday, leading to new areas of tissue loss on the bilateral second toe. There is improvement noted to the bilateral great toe. We will dress toes today and hope that they maintain until Monday where he will come in for a nurse visit. He has been advised, multiple times with expressed verbalization, to change the dressings to silvercel if the dressings get wet prior to [facility] appointment. 08/08/17-He is here in follow-up evaluation for bilateral first and second toe ulcerations. There is significant improvement to all ulcerations since last visit. We will switch to [name] to all wounds and he will continue with nurse visits on a Monday/Thursday schedule and follow-up with me in 2 weeks. He continues to smoke, 1-3 cigarettes per day, and has been encouraged to not smoke until his next follow-up in 2 weeks. 08/15/17 on evaluation today patient's ulcers on his toes actually appear to be doing fairly well the right to ulcers may be a little bit more moist compared to the left I'm not really sure exactly why as the openings appear to be very small and I'm not seeing any evidence of anything significant as far as you lightest or otherwise. Nonetheless this may just be a small setback he's [name], [name] ([id number]) been doing very well with the  [facility] Dressing 08/22/17-He is here in follow-up evaluation for ulcerations to his bilateral first and second toes, there is small/scant amount of drainage noted on today's dressing. He continues with Hydrofera Blue. He continues to smoke, 1 cigarette a day. Voices no complaint or concerns, compliant and compression therapy today. He'll follow-up next week 08/29/17-He is here in follow-up evaluation for ulcerations to his bilateral first and second toes, with a new wound to the left third toe. He states he cut himself while cutting the toenail. He presents today with more maceration and increased measurements; waxing and waning measurements and moisture has been an ongoing issue. He continues to smoke a proximally 1 cigarette per day, but states he has not smoked since Sunday. We will initiate medihoney daily and evaluate next week. He presents today without compression stockings 09/05/17-He is here in follow-up evaluation for ulcerations to bilateral first and second and left third toe. He did not pick up the  medihoney and therefore has not been changing his dressing. He admits to inconsistent where of compression stockings, admits to pain to his bilateral lower extremities with unilateral edema (right greater than left). The ulcerations to multiple toes are more macerated and larger in size than last week. He is wearing compression therapy today. He admits to an overall feeling of weakness and fatigue and has been encouraged to contact his PCP for evaluation; he has a known history of anemia and intraabdominal lymphadenopathy. We will return to silvercel and he will follow up next week 09/19/17-He is here in follow-up evaluation for ulcerations to bilateral first and second toe.he was unable to make last week's appointment. The culture that was obtained on 5/30 grew clindamycin sensitive MRSA; he was initiated on 6/4 but did not start it until 6/7. There is significant improvement in all  wounds, healing to left second and third toe. He also admits to having no alcohol or smoking for the last 6 days. We will continue with same treatment plan I will extend the clindamycin for an additional 10 days and he will follow-up next week. 09/26/17- He is here in follow evaluation for multiple ulcerations to multiple toes bilaterally. He continues to be non-compliant with dressing, compression therapy. He states he is taking the antibiotics as prescribed, although his recall is inconsistent. There is noted deterioration in all ulcers. We will continue same treatment plan, antibiotic therapy and he will follow up next week. 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 This information is obtained from the medical record, as he cannot articulate specifics to his diagnosis or treatment plan. 10/17/17-He is here in follow-up evaluation for multiple ulcerations to multiple toes bilaterally. There is chronic waxing and waning improvement/deterioration. On today's exam they appear improved, dry without maceration. He continues to apply a variety of topical agents, stating that he has switched between the blue product and silver product. In my opinion, this will be a chronic waxing and waning giving his intermittent compliance. He will follow-up in 2 weeks. As it relates to the biopsy taken from his buttock, he has not followed up with that and has not started radiation therapy. 10/31/17-He is here for evaluation for multiple ulcerations to multiple toes bilaterally. Continues with waxing and waning improvement versus deterioration. They are stable on today's exam without significant maceration. 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 We will continue with every 2 week follow-up appointments 11/14/17- He  is seen in follow-up evaluation for multiple ulcerations to multiple bilateral toes. He is scheduled for excision of basal cell carcinoma to his right buttock on Monday 8/12. He continues to have chronic waxing and waning of his ulcers; today is a stable day with minimal drainage and left ear is better than right toes. I will recount to infectious disease regarding chronic suppressive therapy. He admits to being more compliant with compression stockings, although he is not wearing any today. He has been encouraged to continue with dressing changes as ordered and compression therapy daily. He will be seen in 2 weeks 11/28/17-He is seen in follow up evaluation for multiple ulcerations to multiple toes bilaterally. He had an overnight admission at [facility] (8/11-8/12) for symptomatic anemia, received two units prbc. His surgical date has been moved to 9/94 excision of basal cell carcinoma to  the right buttock. He has been noncompliant in wearing compression stockings, applying topical treatment as ordered; he has been applying a "cream", Hydrofera Blue, silvercel. He is currently on no antibiotic therapy; we will send for re-referral to ID for suppressive therapy given his multiple co-morbidities, poor surgical candidate, unwilling to have toe amputation and general non-adherence. 12/19/17 on evaluation today patient actually appears to be doing very well in regard to his toes at least compared to last time I saw him. He again has been coming to our office for quite a bit of time. 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 He states is hurting a little bit but doing well in general. I'm see were not taking care of this area dermatology is. He does have his appointment with infectious disease upcoming which they re-consult for suppressive therapy that's next Monday. 01/02/18 on evaluation  today patient actually appears to be doing rather well in regard to his toe ulcers although he tells me he has been put in an ointment on this which I am concerned may be causing some additional moisture buildup which we really do not want. He supposed be using silver cell which is mainly thing I want him to be utilizing. Fortunately there does not appear to be any evidence of worsening infection which is good news. He has had surgery on his gluteal region where he had an excision of a cancerous area fortunately that seems to be doing well although he is having some discomfort. 02/06/18 on evaluation today patient actually appears to be doing well at all locations except for his second toe use bilaterally Sinor, Dunbar E. ([id number]) where the distal tip is still wrong open. He did state that he can't stand for dry skin to buildup on his toes subsequently as a question and further it sounds as if he actually pills this all which is likely keeping the toe is wrong and open. Also think that's what happened in the bottom of his foot although that appears to have healed at this point. There does not appear to be any evidence of infection. 02/20/18 on evaluation today patient appears to be doing better in regard to his foot ulcers. Everything has shown signs of improvement which is excellent news. Overall I'm very pleased with how things appear currently. The patient states he's not picking her point at any skin and that he wears shoes at all times when he is ambulating at home. Both are things that I've advocated and recommended for him in the past. 03/13/18 on evaluation today patient actually appears to be doing very well in regard to the bilateral toes in the overall appearance of the wound bed locations. Fortunately there does not appear to be any signs of infection at this time. He states that he is very pleased with how things seem to be progressing to be honest as am I. 03/27/1899 evaluation today  patient appears to be doing well in regard to his toe ulcers. He just has a few areas remaining and these appear to be showing signs of improvement as well which is great news. Overall I have been impressed with the progress of the past several weeks. 04/10/18 on evaluation today patient appears to be doing a little bit worse in regard to the right plantar foot at the regional the first metatarsal as well as the left second toe. He tells me that  he ran out of bandages he also tells me he's been wearing his flip-flops a lot at home as well as a new pair of boots that he received as well. This is as opposed to the postop shoes that we had previously given him. Nonetheless overall I feel like that he has had a little bit more friction to the area which is causing things to look a little bit worse today. 04/24/18 on evaluation today patient's wounds for the most part appear to be doing excellent especially in regard to the bilateral second toes. With that being said he does have issues with the wound still on the first metatarsal region and first toe of the right foot which have not completely closed at this point. Nonetheless I do feel like that he is making progress which is good news. 05/08/18 on evaluation today patient's wounds appear to be doing just a little bit worse than normal simply due to the fact that he has not had dressing supplies. He apparently owed $35 in order for them to be shipped and unfortunately is not able to pay that currently. We did give him information for the [employer] today which will hopefully help cover his dressing supplies. He was thankful for this he states is gonna contact them this afternoon. Fortunately otherwise or does not hear any signs of infection. 05/22/18 on evaluation today patient appears to be doing somewhat poorly in regard to his toes. Upon further questioning it appears that he actually has been pulling skin off of the toes which "drive  him crazy". Subsequently as he does this he calls his new areas to tear open and I think this is one of the reasons why his toes are not healing as appropriately as we would like. He fortunately has no signs of infection at this time. With that being said I'm still concerned about the possibility of a fungal infection even though there doesn't appear to be anything bacterial. Potentially nystatin powder could be of benefit for him as far as trying to help this in that regard. Electronic Signature(s) Signed: 05/23/2018 8:42:52 AM By: Worthy Keeler PA-C Entered By: Worthy Keeler on 05/22/2018 09:14:20 [name], [name] ([id number]) -------------------------------------------------------------------------------- Physical Exam Details Patient Name: Prowse, Sandy E. Date of Service: 05/22/2018 8:30 AM Medical Record Number: [id number] Patient Account Number: [id number] Date of Birth/Sex: [birth year] (68 y.o. M) Treating RN: Harold Barban Primary Care Provider: Lamonte Sakai Other Clinician: Referring Provider: Lamonte Sakai Treating Provider/Extender: Melburn Hake, HOYT Weeks in Treatment: 86 Constitutional Well-nourished and well-hydrated in no acute distress. Respiratory normal breathing without difficulty. clear to auscultation bilaterally. Cardiovascular regular rate and rhythm with normal S1, S2. Psychiatric this patient is able to make decisions and demonstrates good insight into disease process. Alert and Oriented x 3. pleasant and cooperative. Notes Patient's wound bed currently shows signs of good granulation at this time for the most part. He does have several small little skin tear areas where again I think he's been pulling on the tissue trying to clear off the thick dead dry skin although in the process he also is damaging good skin unfortunately. There may still be some evidence of a little bit of a fungal infection. For that reason I think I'm gonna start utilizing nystatin powder  for him as well hopefully this should help dry things out better as well. Electronic Signature(s) Signed: 05/23/2018 8:42:52 AM By: Worthy Keeler PA-C Entered By: Worthy Keeler on 05/22/2018 09:15:47 Paulos, Khiree E. (  [id number]) -------------------------------------------------------------------------------- Physician Orders Details Patient Name: [name], [name] Date of Service: 05/22/2018 8:30 AM Medical Record Number: [id number] Patient Account Number: [id number] Date of Birth/Sex: 02-25-1951 (69 y.o. M) Treating RN: Army Melia Primary Care Provider: Lamonte Sakai Other Clinician: Referring Provider: Lamonte Sakai Treating Provider/Extender: Melburn Hake, HOYT Weeks in Treatment: 62 Verbal / Phone Orders: No Diagnosis Coding ICD-10 Coding Code Description L97.521 Non-pressure chronic ulcer of other part of left foot limited to breakdown of skin L97.511 Non-pressure chronic ulcer of other part of right foot limited to breakdown of skin E11.621 Type 2 diabetes mellitus with foot ulcer I87.323 Chronic venous hypertension (idiopathic) with inflammation of bilateral lower extremity I89.0 Lymphedema, not elsewhere classified F17.218 Nicotine dependence, cigarettes, 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 abuse 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, right ankle and foot Wound Cleansing Wound #16 Right,Plantar Toe Great o Clean wound with Normal Saline. Wound #17 Right,Distal,Plantar Metatarsal head second o Clean wound with Normal Saline. Wound #18 Right,Dorsal Toe Great o Clean wound with Normal Saline. Wound #2 Right Toe Second o Clean wound with Normal Saline. Wound #6 Left Toe Second o Clean wound with Normal Saline. Anesthetic (add to Medication List) Wound #16 Right,Plantar Toe Great o Topical Lidocaine 4% cream applied to wound bed prior to debridement (In Clinic Only). Wound #17 Right,Distal,Plantar  Metatarsal head second o Topical Lidocaine 4% cream applied to wound bed prior to debridement (In Clinic Only). Wound #18 Right,Dorsal Toe Great o Topical Lidocaine 4% cream applied to wound bed prior to debridement (In Clinic Only). Wound #2 Right Toe Second o Topical Lidocaine 4% cream applied to wound bed prior to debridement (In Clinic Only). Wound #6 Left Toe Second o Topical Lidocaine 4% cream applied to wound bed prior to debridement (In Clinic Only). BEN, HABERMANN ([id number]) Skin Barriers/Peri-Wound Care Wound #16 Right,Plantar Toe Great o Antifungal powder-Nystatin Wound #17 Right,Distal,Plantar Metatarsal head second o Antifungal powder-Nystatin Wound #18 Right,Dorsal Toe Great o Antifungal powder-Nystatin Wound #2 Right Toe Second o Antifungal powder-Nystatin Wound #6 Left Toe Second o Antifungal powder-Nystatin Primary Wound Dressing Wound #16 Right,Plantar Toe Great o Silver Alginate - secure with tape Wound #17 Right,Distal,Plantar Metatarsal head second o Silver Alginate - secure with tape Wound #18 Right,Dorsal Toe Great o Silver Alginate - secure with tape Wound #2 Right Toe Second o Silver Alginate - secure with tape Wound #6 Left Toe Second o Silver Alginate - secure with tape Dressing Change Frequency Wound #16 Right,Plantar Toe Great o Change dressing every day. Wound #17 Right,Distal,Plantar Metatarsal head second o Change dressing every day. Wound #18 Right,Dorsal Toe Great o Change dressing every day. Wound #2 Right Toe Second o Change dressing every day. Wound #6 Left Toe Second o Change dressing every day. Follow-up Appointments Wound #16 Right,Plantar Toe Great o Return Appointment in 2 weeks. Wound #17 Right,Distal,Plantar Metatarsal head second o Return Appointment in 2 weeks. Wound #18 [id number] [name] E. ([id number]) o Return Appointment in 2 weeks. Wound #2 Right Toe  Second o Return Appointment in 2 weeks. Wound #6 Left Toe Second o Return Appointment in 2 weeks. Edema Control Wound #16 Right,Plantar Toe Great o Patient to wear own compression stockings Wound #17 Right,Distal,Plantar Metatarsal head second o Patient to wear own compression stockings Wound #18 Right,Dorsal Toe Great o Patient to wear own compression stockings Wound #2 Right Toe Second o Patient to wear own compression stockings Wound #6 Left Toe Second o Patient to wear own compression stockings  Additional Orders / Instructions Wound #16 Right,Plantar Toe Great o Stop Smoking o Increase protein intake. Wound #17 Right,Distal,Plantar Metatarsal head second o Stop Smoking o Increase protein intake. Wound #18 Right,Dorsal Toe Great o Stop Smoking o Increase protein intake. Wound #2 Right Toe Second o Stop Smoking o Increase protein intake. Wound #6 Left Toe Second o Stop Smoking o Increase protein intake. Medications-please add to medication list. Wound #16 Right,Plantar Toe Great o Other: - Nystatin Powder Wound #17 Right,Distal,Plantar Metatarsal head second o Other: - Nystatin Powder Wound #18 Right,Dorsal Toe Great o Other: - Nystatin Powder Wound #2 Right Toe Second [name], [name] ([id number]) o Other: - Nystatin Powder Wound #6 Left Toe Second o Other: - Nystatin Powder Patient Medications Allergies: No Known Drug Allergies Notifications Medication Indication Start End nystatin 05/22/2018 DOSE topical 100,000 unit/gram powder - powder topical applied to the toes on both feet to be done with each dressing change as directed in clinic. Electronic Signature(s) Signed: 05/22/2018 9:17:39 AM By: Worthy Keeler PA-C Entered By: Worthy Keeler on 05/22/2018 09:17:39 Arvidson, [name] ([id number]) -------------------------------------------------------------------------------- Problem List Details Patient Name: [name], [name]  E. Date of Service: 05/22/2018 8:30 AM Medical Record Number: [id number] Patient Account Number: [id number] Date of Birth/Sex: 04-May-1950 (68 y.o. M) Treating RN: Harold Barban Primary Care Provider: Lamonte Sakai Other Clinician: Referring Provider: Lamonte Sakai Treating Provider/Extender: Melburn Hake, HOYT Weeks in Treatment: 39 Active Problems ICD-10 Evaluated Encounter Code Description Active Date Today Diagnosis L97.521 Non-pressure chronic ulcer of other part of left foot limited to 09/21/2016 No Yes breakdown of skin L97.511 Non-pressure chronic ulcer of other part of right foot limited to 09/21/2016 No Yes breakdown of skin E11.621 Type 2 diabetes mellitus with foot ulcer 09/21/2016 No Yes I87.323 Chronic venous hypertension (idiopathic) with inflammation of 09/21/2016 No Yes bilateral lower extremity I89.0 Lymphedema, not elsewhere classified 09/21/2016 No Yes F17.218 Nicotine dependence, cigarettes, with other nicotine-induced 09/21/2016 No Yes disorders F10.19 Alcohol abuse with unspecified alcohol-induced disorder 09/21/2016 No Yes M86.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, right ankle and foot 02/21/2017 No Yes Inactive Problems Resolved Problems Agudelo, DAISHAWN LAUF ([id number]) Electronic Signature(s) Signed: 05/23/2018 8:42:52 AM By: Worthy Keeler PA-C Entered By: Worthy Keeler on 05/22/2018 08:56:52 Mulroy, [name] ([id number]) -------------------------------------------------------------------------------- Progress Note Details Patient Name: [name], [name] Date of Service: 05/22/2018 8:30 AM Medical Record Number: [id number] Patient Account Number: [id number] Date of Birth/Sex: 01/19/1951 (68 y.o. M) Treating RN: Harold Barban Primary Care Provider: Lamonte Sakai Other Clinician: Referring Provider: Lamonte Sakai Treating Provider/Extender: Melburn Hake, HOYT Weeks in Treatment: 18 Subjective Chief Complaint Information obtained from Patient Patient presents for treatment  of an open diabetic ulcer to both feet History of Present Illness (HPI) 68 year old patient here to see as for bilateral feet ulceration to on his left first and second toe and 2 on his right first and second toe, which she's had for about 4 months. 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 Donella Stade. The patient is also undergoing workup by medical oncology for a intra-abdominal lymphadenopathy. 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 He is also status post appendectomy, inguinal lymph node biopsy, rectal biopsy and rectal examination under anesthesia. he currently smokes cigarettes about half packet a day. In March of this year he  was seen by Dr. Hortencia Pilar, for evaluation of bilateral varicose veins and besides wearing compression stockings he had recommended laser ablation of the right and left great saphenous veins to eleviate the symptoms and complications of severe superficial venous reflux disease. He also recommended lymphedema pumps for better control of his lymphedema. The patient recently has had on 08/23/2016, right greater saphenous vein ablation with the laser energy Earlier lower extremity venous reflux examination done on 05/08/2016 showed no DVT or SVT both lower legs but incompetence of bilateral great saphenous veins was present. A lower arterial study was also done and there was no significant right lower and left lower extremity problems based on a normal toe brachial index bilaterally and the ABI was 1.21 the left and 1.23 on the right. His post ablation venous duplex examination showed successful ablation of the right GS vein with thrombus formation 2 below the right saphenofemoral junction. The deep system was patent  without evidence of thrombosis and this was done on 08/30/2016. 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 Addendum: regarding his x-rays done today and x-ray of the left foot -- IMPRESSION: No objective evidence of osteomyelitis. There are soft tissue changes which may reflect cellulitis. X-ray of the right foot -- IMPRESSION:Findings compatible with cellulitis of the toes. No objective evidence of osteomyelitis is observed. 10/01/16 on evaluation today patient's wounds appeared to be doing some better. I did review the x-rays as well which showed no evidence of osteomyelitis although there was evidence on x-ray of cellulitis. He fortunately is not having any discomfort although he continues to have some swelling. He does not remember being on any antibiotics recently. 10/15/16 on evaluation today patient's wounds overall appear to be doing better although he does have a new location noted on the left foot. Fortunately he is not having significant pain. It almost has the appearance that something is rubbing on the end of his toes but he wears the open toe shoes and according to what he is telling me never wears anything that would rub on his foot. There is no evidence of infection and specifically no evidence of a fungal infection 10/22/16 On evaluation today patient's wounds appeared to be doing better compared to last week in regard to his bilateral lower extremities. Fortunately I happy with how things are progressing although he still has ulcers I feel like that he is improving and appropriate manner. GARRY, NICOLINI ([id number]) 11/12/16 on evaluation today patient appears to be doing well in regard to his bilateral feet and the respective wounds. We have been using surrounding her dressings along with an antifungal cream which seems to be doing very well. He has no bilateral dysfunction  noticed that the rituals are weight loss at this point. He also has no nausea or vomiting a note purulent discharge. He did see Vein and vascular today and he tells me that they told him he could have surgery for his venous stasis but they did not feel like it was worth it in his words. Fortunately patient's wounds do appear to be getting sneakily better. 11/26/2016 -- he says he is going to have some surgery during this week at [facility] for possibly a colon resection. 12/31/2016 -- the patient has been noncompliant with his smoking and I'm not sure whether he is also started drinking again. He continues to be very nonchalant about his care  01/14/2017 -- the patient's HandP has been reviewed well and I understand he is being compliant with trying to give up smoking and his local dressing changes. He does not have any surgical options of 4 to him by his vascular surgeons.he was last seen in early August by Dr. Hortencia Pilar who recommended compression stockings,and possibly lymph pumps in 2-3 months after doing a review ultrasound. 01/28/2017 - the patient did not have any fresh complaints but on examination I noted a large lacerated wound on the plantar aspect of his right fourth toe which had a lot of necrotic debris and it probes down to bone. 02/07/2017 -- x-ray of the right foot -- IMPRESSION: Soft tissue swelling about the first through fourth toes consistent with cellulitis. New destructive change in the tuft of the distal phalanx of the great toe is consistent with osteomyelitis. 02/14/2017 -- the patient's MRI is pending this coming Monday and he still continues to smoke. We have again gone over off loading of his wounds in great detail and he says he's been compliant. 02/21/2017 -- MR of the right foot -- IMPRESSION: 1. Soft tissue ulcer at the tip of the first, second and third toe knows. Cortical irregularity and bone marrow edema in the first distal phalanx most concerning for  osteomyelitis. Mild marrow edema in the second and third distal phalanx without definite cortical destruction which may reflect early osteomyelitis versus reactive marrow edema. 2. Soft tissue edema surrounding the first phalanx most consistent with cellulitis. the patient was also recently evaluated by his medical oncologist Dr. 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 She is treating him with weekly Procrit. She is also keeping intra-abdominal lymphadenopathy and right lower lobe lung nodule under observation. 04/04/2017 -- he was seen by Dr. Adrian Prows on 03/25/2017 -- after review he empirically put him on ciprofloxacin and doxycycline as they have good bone penetration and good bioavailability and it will cover the usual pathogens and diabetic foot osteomyelitis. He will check inflammatory markers and plan a 41-90 week old records. C-reactive protein was 0.3 and the ESR was 72 04/18/17 on evaluation today patient appears to be doing about the same in regard to his lower extremity wounds bilaterally. He has continued to use the antifungal cream which does seem to be beneficial. Nonetheless the ulcers do seem to in some areas be epithelial eyes over and in other areas are still open. He is having no significant discomfort. 04/25/17-he is here in follow-up evaluation for multiple ulcerations to multiple toes bilaterally. He states he did see Dr. Ola Spurr again last week and continues antibiotic therapy. He is voicing no complaints or concerns, will continue with current treatment plan will possibility of adding compression therapy next week after an additional week of treatment/lotions to BLE prescribed by Dr Ola Spurr 05/02/17 he is here in follow up for for multiple ulcers to multiple toes bilaterally. we will stop using antifungal cream and will continue with silvercel and follow up next week 05/09/17-he is here in  follow-up for multiple ulcerations to multiple toes bilaterally. There is improvement in appearance. He has not completely stopped using antifungal cream, but admits he has not using it between the toes. He has an appointment with Dr. Ola Spurr on 2/11, continues on doxycycline and Cipro. It has been 5 weeks of antibiotic therapy, we will order plain film xray to evaluate for osteomyelitis next week, prior to follow up with ID. Will continue  with silvercel and follow up next week 05/16/17-he is here in follow-up evaluation for multiple ulcerations to multiple toes bilaterally and new wound to the right posterior heel. There is essentially no change in appearance, deteriorating measurements; he has a history of waxing and waning measurements. He admits that he continues to apply moisturizer/cream/ointment to his toes despite weekly reminders to only apply silvercel to his toes. He states that he thinks the surgical shoe contributed to the superficial ulcer to his posterior heel, he is unable to articulate if this was an area of dry cracked skin as he has a similar area to the left heel. He now is wearing open toed slippers. He has an appointment with Dr. Ola Spurr on 2/11. We have ordered x-rays for her bilateral feet; Mandler, Petar E. ([id number]) he was advised to obtain the x-rays today or tomorrow. He will follow-up next week 05/23/17-he is here in follow-up evaluation for multiple ulcerations to multiple toes bilaterally and the right posterior heel. There is improvement in maceration. He has been compliant and not applying any moisturizing agent to his toes. He has been using Lac-Hydrin for his lower extremities with improvement. He did not go to his appointment on Monday with Dr. Ola Spurr secondary to financial concerns. X-rays for her bilateral feet showed: LEFT FOOT with slight erosion of the tuft of the distal phalanges of the left first and second toe suspicious for osteomyelitis, RIGHT FOOT  with 1.erosion of the tufts of the distal phalanges of the right first second and possibly third toes consistent with osteomyelitis, 2 no definitive abnormality of the calcaneus is seen on the images obtained, 3. Plantar calcaneal degenerative spur. We briefly discussed hyperbaric adjunctive therapy for treatment of chronic refractory osteomyelitis. I do not find an a1c in EMR, will contact PCP for record, or order if needed. He has been encouraged to contact Dr Ola Spurr office regarding the follow-up appointment, encouraged him to inquire about payment plan. We will continue with same treatment plan and follow-up next week. He states he is still taking antibiotics and has "a lot" left. He states he has been taking them as directed, 2 pills twice daily. According to Dr. Blane Ohara office notes he was originally started on 12/17 for 4 weeks and extended on 1/14 for an additional 4 weeks. He should be done with his antibiotic therapy, he was advised to bring his bottles and to his next appointment, we will contact pharmacy. 05/30/17-he is here in follow-up evaluation for multiple ulcerations to multiple toes bilaterally and the right posterior heel. He is accompanied by his brother-in-law. Wounds are stable. He has yet to make up with Dr. Ola Spurr. We contacted his PCP, with no record of recent A1c we will draw an A1c. His brother-in-law states that he was taken off all of his diabetic medication secondary to kidney function. 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 The antibiotics were counted and have approximately 2 weeks left, although they should be complete. The pill organizer reveals missing days. We discussed the need for consistent medications, to have optimal benefit of medication. He has a cousin that lives with him and he will ask her to check his organizer daily. He has been advised  to follow up with Dr Ola Spurr, and will go by the office today. He has been advised to quit smoking. 06/06/17-he is here in follow up evaluation. He has had to make an appointment with Dr. Ola Spurr.  He did have blood work obtained, a1c 5. He continues to take antibiotic therapy. Significant improvement in bilateral lower extremity edema with compression therapy. Essentially no change in ulcerations to toes. He states he is "going to try something different" and "let me know next week" if it works; he would not provide any additional information and was encouraged to follow our orders. We will follow up next week 06/13/17-he is here in follow-up evaluation. He has an appointment with Dr. Ola Spurr tomorrow morning. He states he purchased an ointment from [facility] and applied to his toes for 3 days, he does not remember the name of the ointment. There is improvement to his wounds, minimal maceration. He continues to take antibiotic therapy, this should have been completed last month. His brother-in-law who regularly accompanies his appointments was asked to take the bottles to the appointment tomorrow with Dr. Ola Spurr so he is aware. We will continue with 3 layer compression, and order OPEN TOE compression 20-30mmHg; we will apply compression stockings next week. He continues to smoke, smoked "2 cigarettes" last week 06/20/17 on evaluation today patient did receive his compression stockings which he has with him today for both lower extremities. With that being said he tells me at this point in time that he is very happy to have these he really is not a big fan of the compression wraps that we have been utilizing although they have been of great benefit for him. Nonetheless at this point he does want to switch to the compression stockings. In my opinion as long as he is continuing with compression I'm okay with the stockings or the wraps. 06/27/17-he is here in follow-up evaluation for multiple  ulcerations to his bilateral toes. There is some improvement in appearance. He is compliant in wearing his compression stockings with significant improvement in lower extremity edema. He saw Dr Ola Spurr on 3/8, per his notes they would redraw ESR and CRP; plan to continue antibiotic therapy if these remain elevated. I do not see an ESR or CRP level in Epic. The patient continues to take antibiotics. 07/11/17-He is here in follow-up evaluation for multiple ulcerations to multiple toes bilaterally. He presents with complete epithelialization to the right third toe; there has been no deterioration. He continues on antibiotic therapy. He will follow-up next week 07/18/17-He is here in follow-up evaluation for multiple ulcerations to multiple toes bilaterally. He continues to make improvement. He continues on antibiotic therapy. He states he has been using something additional to our orders, he does not elaborate but states he will bring it in next week. 07/25/17-He is here in follow up evaluation for multiple ulcerations to bilateral toes. He is stable. He has completed antibiotic therapy. He admits to "filing" his toes after showers each evening, this is what he was referencing last week; he does not filing for the wounds. We will switch to [facility] and monitor for any improvement, he will follow-up next week 08/01/17-He is here in follow-up evaluation. He admits to "picking" at his toes after cleansing yesterday, leading to new areas of tissue loss on the bilateral second toe. There is improvement noted to the bilateral great toe. We will dress toes today and hope that they maintain until Monday where he will come in for a nurse visit. He has been advised, multiple times with expressed verbalization, to change the dressings to silvercel if the dressings get wet prior to [facility] appointment. 08/08/17-He is here in follow-up evaluation for bilateral first and second toe ulcerations. There is  significant improvement to all ulcerations since last visit. We will switch to [facility] to all wounds and he will continue with nurse visits on a Frid, Nichlas E. ([id number]) Monday/Thursday schedule and follow-up with me in 2 weeks. He continues to smoke, 1-3 cigarettes per day, and has been encouraged to not smoke until his next follow-up in 2 weeks. 08/15/17 on evaluation today patient's ulcers on his toes actually appear to be doing fairly well the right to ulcers may be a little bit more moist compared to the left I'm not really sure exactly why as the openings appear to be very small and I'm not seeing any evidence of anything significant as far as you lightest or otherwise. Nonetheless this may just be a small setback he's been doing very well with the [facility] Dressing 08/22/17-He is here in follow-up evaluation for ulcerations to his bilateral first and second toes, there is small/scant amount of drainage noted on today's dressing. He continues with Hydrofera Blue. He continues to smoke, 1 cigarette a day. Voices no complaint or concerns, compliant and compression therapy today. He'll follow-up next week 08/29/17-He is here in follow-up evaluation for ulcerations to his bilateral first and second toes, with a new wound to the left third toe. He states he cut himself while cutting the toenail. He presents today with more maceration and increased measurements; waxing and waning measurements and moisture has been an ongoing issue. He continues to smoke a proximally 1 cigarette per day, but states he has not smoked since Sunday. We will initiate medihoney daily and evaluate next week. He presents today without compression stockings 09/05/17-He is here in follow-up evaluation for ulcerations to bilateral first and second and left third toe. He did not pick up the medihoney and therefore has not been changing his dressing. He admits to inconsistent where of compression stockings, admits  to pain to his bilateral lower extremities with unilateral edema (right greater than left). The ulcerations to multiple toes are more macerated and larger in size than last week. He is wearing compression therapy today. He admits to an overall feeling of weakness and fatigue and has been encouraged to contact his PCP for evaluation; he has a known history of anemia and intraabdominal lymphadenopathy. We will return to silvercel and he will follow up next week 09/19/17-He is here in follow-up evaluation for ulcerations to bilateral first and second toe.he was unable to make last week's appointment. The culture that was obtained on 5/30 grew clindamycin sensitive MRSA; he was initiated on 6/4 but did not start it until 6/7. There is significant improvement in all wounds, healing to left second and third toe. He also admits to having no alcohol or smoking for the last 6 days. We will continue with same treatment plan I will extend the clindamycin for an additional 10 days and he will follow-up next week. 09/26/17- He is here in follow evaluation for multiple ulcerations to multiple toes bilaterally. He continues to be non-compliant with dressing, compression therapy. He states he is taking the antibiotics as prescribed, although his recall is inconsistent. There is noted deterioration in all ulcers. We will continue same treatment plan, antibiotic therapy and he will follow up next week. 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 This information is obtained from the medical record, as he cannot articulate  specifics to his diagnosis or treatment plan. 10/17/17-He is here in follow-up evaluation for multiple ulcerations to multiple toes bilaterally. There is chronic waxing and waning improvement/deterioration. On today's exam they  appear improved, dry without maceration. He continues to apply a variety of topical agents, stating that he has switched between the blue product and silver product. In my opinion, this will be a chronic waxing and waning giving his intermittent compliance. He will follow-up in 2 weeks. As it relates to the biopsy taken from his buttock, he has not followed up with that and has not started radiation therapy. 10/31/17-He is here for evaluation for multiple ulcerations to multiple toes bilaterally. Continues with waxing and waning improvement versus deterioration. They are stable on today's exam without significant maceration. 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 We will continue with every 2 week follow-up appointments 11/14/17- He is seen in follow-up evaluation for multiple ulcerations to multiple bilateral toes. He is scheduled for excision of basal cell carcinoma to his right buttock on Monday 8/12. He continues to have chronic waxing and waning of his ulcers; today is a stable day with minimal drainage and left ear is better than right toes. I will recount to infectious disease regarding chronic suppressive therapy. He admits to being more compliant with compression stockings, although he is not wearing any today. He has been encouraged to continue with dressing changes as ordered and compression therapy daily. He will be seen in 2 weeks 11/28/17-He is seen in follow up evaluation for multiple ulcerations to multiple toes bilaterally. He had an overnight admission at [facility] (8/11-8/12) for symptomatic anemia, received two units prbc. His surgical date has been moved to 9/94 excision of basal cell carcinoma to the right buttock. He has been noncompliant in wearing compression stockings, applying topical treatment as ordered; he has been applying a "cream", Hydrofera Blue, silvercel. He is currently on no antibiotic therapy; we will send for re-referral  to ID for suppressive therapy given his multiple co-morbidities, poor surgical candidate, unwilling to have toe amputation and general non-adherence. 12/19/17 on evaluation today patient actually appears to be doing very well in regard to his toes at least compared to last time I saw him. He again has been coming to our office for quite a bit of time. 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 He states is hurting a little bit but doing well in general. I'm see were not taking care of this area dermatology is. He does have his appointment with infectious disease upcoming which they re-consult for suppressive therapy that's next Monday. 01/02/18 on evaluation today patient actually appears to be doing rather well in regard to his toe ulcers although he tells me he [name], [name] ([id number]) has been put in an ointment on this which I am concerned may be causing some additional moisture buildup which we really do not want. He supposed be using silver cell which is mainly thing I want him to be utilizing. Fortunately there does not appear to be any evidence of worsening infection which is good news. He has had surgery on his gluteal region where he had an excision of a cancerous area fortunately that seems to be doing well although he is having some discomfort. 02/06/18 on evaluation today patient actually appears to be doing well at all locations except for his second toe use  bilaterally where the distal tip is still wrong open. He did state that he can't stand for dry skin to buildup on his toes subsequently as a question and further it sounds as if he actually pills this all which is likely keeping the toe is wrong and open. Also think that's what happened in the bottom of his foot although that appears to have healed at this point. There does not appear to be any evidence of  infection. 02/20/18 on evaluation today patient appears to be doing better in regard to his foot ulcers. Everything has shown signs of improvement which is excellent news. Overall I'm very pleased with how things appear currently. The patient states he's not picking her point at any skin and that he wears shoes at all times when he is ambulating at home. Both are things that I've advocated and recommended for him in the past. 03/13/18 on evaluation today patient actually appears to be doing very well in regard to the bilateral toes in the overall appearance of the wound bed locations. Fortunately there does not appear to be any signs of infection at this time. He states that he is very pleased with how things seem to be progressing to be honest as am I. 03/27/1899 evaluation today patient appears to be doing well in regard to his toe ulcers. He just has a few areas remaining and these appear to be showing signs of improvement as well which is great news. Overall I have been impressed with the progress of the past several weeks. 04/10/18 on evaluation today patient appears to be doing a little bit worse in regard to the right plantar foot at the regional the first metatarsal as well as the left second toe. He tells me that he ran out of bandages he also tells me he's been wearing his flip-flops a lot at home as well as a new pair of boots that he received as well. This is as opposed to the postop shoes that we had previously given him. Nonetheless overall I feel like that he has had a little bit more friction to the area which is causing things to look a little bit worse today. 04/24/18 on evaluation today patient's wounds for the most part appear to be doing excellent especially in regard to the bilateral second toes. With that being said he does have issues with the wound still on the first metatarsal region and first toe of the right foot which have not completely closed at this point. Nonetheless I  do feel like that he is making progress which is good news. 05/08/18 on evaluation today patient's wounds appear to be doing just a little bit worse than normal simply due to the fact that he has not had dressing supplies. He apparently owed $35 in order for them to be shipped and unfortunately is not able to pay that currently. We did give him information for the [employer] today which will hopefully help cover his dressing supplies. He was thankful for this he states is gonna contact them this afternoon. Fortunately otherwise or does not hear any signs of infection. 05/22/18 on evaluation today patient appears to be doing somewhat poorly in regard to his toes. Upon further questioning it appears that he actually has been pulling skin off of the toes which "drive him crazy". Subsequently as he does this he calls his new areas to tear open and I think this is one of the reasons why his toes are not healing as  appropriately as we would like. He fortunately has no signs of infection at this time. With that being said I'm still concerned about the possibility of a fungal infection even though there doesn't appear to be anything bacterial. Potentially nystatin powder could be of benefit for him as far as trying to help this in that regard. Patient History Information obtained from Patient. Social History Current every day smoker, Marital Status - Widowed, Alcohol Use - Daily - quit drinking about a week ago, Drug Use - No History, Caffeine Use - Moderate. Medical History Eyes Denies history of Cataracts, Glaucoma, Optic Neuritis Ear/Nose/Mouth/Throat Swaney, Kveon E. ([id number]) Denies history of Chronic sinus problems/congestion, Middle ear problems Hematologic/Lymphatic Patient has history of Anemia, Lymphedema Denies history of Hemophilia, Human Immunodeficiency Virus, Sickle Cell Disease Respiratory Denies history of Aspiration, Asthma, Chronic Obstructive Pulmonary Disease  (COPD), Pneumothorax, Sleep Apnea, Tuberculosis Cardiovascular Patient has history of Congestive Heart Failure, Hypertension, Peripheral Venous Disease Denies history of Angina, Arrhythmia, Coronary Artery Disease, Deep Vein Thrombosis, Hypotension, Myocardial Infarction, Peripheral Arterial Disease, Phlebitis, Vasculitis Gastrointestinal Denies history of Cirrhosis , Colitis, Crohn s, Hepatitis A, Hepatitis B, Hepatitis C Endocrine Patient has history of Type II Diabetes Genitourinary Denies history of End Stage Renal Disease Immunological Denies history of Lupus Erythematosus, Raynaud s, Scleroderma Integumentary (Skin) Denies history of History of Burn, History of pressure wounds Musculoskeletal Denies history of Gout, Rheumatoid Arthritis, Osteoarthritis, Osteomyelitis Neurologic Patient has history of Neuropathy Denies history of Dementia, Quadriplegia, Paraplegia, Seizure Disorder Medical And Surgical History Notes Oncologic 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 Review of Systems (ROS) Constitutional Symptoms (General Health) Denies complaints or symptoms of Fever, Chills. Respiratory The patient has no complaints or symptoms. Cardiovascular The patient has no complaints or symptoms. Psychiatric The patient has no complaints or symptoms. Objective Constitutional Well-nourished and well-hydrated in no acute distress. Vitals Time Taken: 8:41 AM, Height: 69 in, Weight: 168 lbs, BMI: 24.8, Temperature: 98.2 F, Pulse: 69 bpm, Respiratory Rate: 16 breaths/min, Blood Pressure: 158/75 mmHg. Respiratory [name], [name] ([id number]) normal breathing without difficulty. clear to auscultation bilaterally. Cardiovascular regular rate and rhythm with normal S1, S2. Psychiatric this patient is able to make decisions and demonstrates good insight into disease process. Alert and Oriented x 3. pleasant and cooperative. General Notes: Patient's wound bed  currently shows signs of good granulation at this time for the most part. He does have several small little skin tear areas where again I think he's been pulling on the tissue trying to clear off the thick dead dry skin although in the process he also is damaging good skin unfortunately. There may still be some evidence of a little bit of a fungal infection. For that reason I think I'm gonna start utilizing nystatin powder for him as well hopefully this should help dry things out better as well. Integumentary (Hair, Skin) Wound #16 status is Open. Original cause of wound was Not Known. The wound is located on the [employer]. The wound measures 2.1cm length x 1.5cm width x 0.1cm depth; 2.474cm^2 area and 0.247cm^3 volume. There is Fat Layer (Subcutaneous Tissue) Exposed exposed. There is no tunneling or undermining noted. There is a medium amount of serous drainage noted. The wound margin is flat and intact. There is large (67-100%) pink granulation within the wound bed. There is a small (1-33%) amount of necrotic tissue within the wound bed including Adherent Slough. The periwound skin appearance exhibited: Maceration. The periwound skin appearance did not exhibit: Callus,  Crepitus, Excoriation, Induration, Rash, Scarring, Dry/Scaly, Atrophie Blanche, Cyanosis, Ecchymosis, Hemosiderin Staining, Mottled, Pallor, Rubor, Erythema. Periwound temperature was noted as No Abnormality. Wound #17 status is Open. Original cause of wound was Trauma. The wound is located on the Right,Distal,Plantar Metatarsal head second. The wound measures 1.3cm length x 0.4cm width x 0.1cm depth; 0.408cm^2 area and 0.041cm^3 volume. There is Fat Layer (Subcutaneous Tissue) Exposed exposed. There is no tunneling or undermining noted. There is a medium amount of serous drainage noted. The wound margin is flat and intact. There is large (67-100%) pink granulation within the wound bed. There is a small (1-33%)  amount of necrotic tissue within the wound bed including Adherent Slough. The periwound skin appearance exhibited: Maceration. The periwound skin appearance did not exhibit: Callus, Crepitus, Excoriation, Induration, Rash, Scarring, Dry/Scaly, Atrophie Blanche, Cyanosis, Ecchymosis, Hemosiderin Staining, Mottled, Pallor, Rubor, Erythema. Periwound temperature was noted as No Abnormality. Wound #18 status is Open. Original cause of wound was Gradually Appeared. The wound is located on the Right,[employer] The wound measures 0.6cm length x 1.7cm width x 0.1cm depth; 0.801cm^2 area and 0.08cm^3 volume. There is Fat Layer (Subcutaneous Tissue) Exposed exposed. There is no tunneling or undermining noted. There is a medium amount of serous drainage noted. The wound margin is flat and intact. There is large (67-100%) red granulation within the wound bed. There is no necrotic tissue within the wound bed. The periwound skin appearance did not exhibit: Callus, Crepitus, Excoriation, Induration, Rash, Scarring, Dry/Scaly, Maceration, Atrophie Blanche, Cyanosis, Ecchymosis, Hemosiderin Staining, Mottled, Pallor, Rubor, Erythema. Periwound temperature was noted as No Abnormality. Wound #2 status is Open. Original cause of wound was Gradually Appeared. The wound is located on the Right Toe Second. The wound measures 0.5cm length x 0.6cm width x 0.1cm depth; 0.236cm^2 area and 0.024cm^3 volume. The wound is limited to skin breakdown. There is no tunneling or undermining noted. There is a medium amount of serous drainage noted. The wound margin is indistinct and nonvisible. There is large (67-100%) pink granulation within the wound bed. There is no necrotic tissue within the wound bed. The periwound skin appearance did not exhibit: Callus, Crepitus, Excoriation, Induration, Rash, Scarring, Dry/Scaly, Maceration, Atrophie Blanche, Cyanosis, Ecchymosis, Hemosiderin Staining, Mottled, Pallor, Rubor, Erythema.  Periwound temperature was noted as No Abnormality. Wound #6 status is Open. Original cause of wound was Gradually Appeared. The wound is located on the Left Toe Second. The wound measures 1.4cm length x 1.6cm width x 0.1cm depth; 1.759cm^2 area and 0.176cm^3 volume. The wound is limited to skin breakdown. There is no tunneling or undermining noted. There is a medium amount of serous drainage noted. The wound margin is indistinct and nonvisible. There is large (67-100%) pink granulation within the wound bed. There is no necrotic tissue within the wound bed. The periwound skin appearance exhibited: Maceration. The periwound skin appearance did not exhibit: Callus, Crepitus, Excoriation, Induration, Rash, Scarring, Dry/Scaly, Atrophie Blanche, Cyanosis, Ecchymosis, Hemosiderin Staining, Mottled, Pallor, Rubor, Erythema. Periwound temperature was noted as No Abnormality. 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 Wound #17 Right,Distal,Plantar Metatarsal head second: Clean wound with Normal Saline. Wound #18 Right,Dorsal Toe Great: Clean wound with Normal Saline. Wound #2 Right Toe Second: Clean wound with Normal Saline. Wound #6 Left Toe Second: Clean wound with Normal Saline. Anesthetic (add to Medication List): Wound #16 Right,Plantar Toe Great: Topical Lidocaine 4% cream  applied to wound bed prior to debridement (In Clinic Only). Wound #17 Right,Distal,Plantar Metatarsal head second: Topical Lidocaine 4% cream applied to wound bed prior to debridement (In Clinic Only). Wound #18 Right,Dorsal Toe Great: Topical Lidocaine 4% cream applied to wound bed prior to debridement (In Clinic Only). Wound #2 Right Toe Second: Topical Lidocaine 4% cream applied to wound bed prior to debridement (In Clinic Only). Wound #6 Left Toe Second: Topical Lidocaine 4% cream applied to wound bed prior to debridement (In Clinic Only). Skin Barriers/Peri-Wound Care: Wound #16 Right,Plantar Toe Great: Antifungal powder-Nystatin Wound #17 Right,Distal,Plantar Metatarsal head second: Antifungal powder-Nystatin Wound #18 Right,Dorsal Toe Great: Antifungal powder-Nystatin Wound #2 Right Toe Second: Antifungal powder-Nystatin Wound #6 Left Toe Second: Antifungal powder-Nystatin Primary Wound Dressing: Wound #16 Right,Plantar Toe GreatHENDRIK, [name] ([id number]) Silver Alginate - secure with tape Wound #17 Right,Distal,Plantar Metatarsal head second: Silver Alginate - secure with tape Wound #18 Right,Dorsal Toe Great: Silver Alginate - secure with tape Wound #2 Right Toe Second: Silver Alginate - secure with tape Wound #6 Left Toe Second: Silver Alginate - secure with tape Dressing Change Frequency: Wound #16 Right,Plantar Toe Great: Change dressing every day. Wound #17 Right,Distal,Plantar Metatarsal head second: Change dressing every day. Wound #18 Right,Dorsal Toe Great: Change dressing every day. Wound #2 Right Toe Second: Change dressing every day. Wound #6 Left Toe Second: Change dressing every day. Follow-up Appointments: Wound #16 Right,Plantar Toe Great: Return Appointment in 2 weeks. Wound #17 Right,Distal,Plantar Metatarsal head second: Return Appointment in 2 weeks. Wound #18 Right,Dorsal Toe Great: Return Appointment in 2 weeks. Wound #2 Right Toe  Second: Return Appointment in 2 weeks. Wound #6 Left Toe Second: Return Appointment in 2 weeks. Edema Control: Wound #16 Right,Plantar Toe Great: Patient to wear own compression stockings Wound #17 Right,Distal,Plantar Metatarsal head second: Patient to wear own compression stockings Wound #18 Right,Dorsal Toe Great: Patient to wear own compression stockings Wound #2 Right Toe Second: Patient to wear own compression stockings Wound #6 Left Toe Second: Patient to wear own compression stockings Additional Orders / Instructions: Wound #16 Right,Plantar Toe Great: Stop Smoking Increase protein intake. Wound #17 Right,Distal,Plantar Metatarsal head second: Stop Smoking Increase protein intake. Wound #18 Right,Dorsal Toe Great: Stop Smoking Increase protein intake. Wound #2 Right Toe Second: Stop Smoking Increase protein intake. Wound #6 Left Toe Second: Stop Smoking Increase protein intake. Medications-please add to medication list.: Wound #16 Right,Plantar Toe GreatQUENTEZ, LOBER ([id number]) Other: - Nystatin Powder Wound #17 Right,Distal,Plantar Metatarsal head second: Other: - Nystatin Powder Wound #18 Right,Dorsal Toe Great: Other: - Nystatin Powder Wound #2 Right Toe Second: Other: - Nystatin Powder Wound #6 Left Toe Second: Other: - Nystatin Powder The following medication(s) was prescribed: nystatin topical 100,000 unit/gram powder powder topical applied to the toes on both feet to be done with each dressing change as directed in clinic. starting 05/22/2018 Patient's wounds currently again I think her for the most part self-inflicted and is keeping this open by picking up the dry skin on the end of his toes. Nonetheless he does have a supplies now I'm gonna send in a prescription for the nystatin powder  to the pharmacy for him. We will subsequently see were things stand at follow-up. If anything changes or worsens in the meantime he will contact the office and let  me know. Please see above for specific wound care orders. We will see patient for re-evaluation in two week(s) here in the clinic. If anything worsens or changes patient will contact our office for additional recommendations. Electronic Signature(s) Signed: 05/23/2018 8:42:52 AM By: Worthy Keeler PA-C Entered By: Worthy Keeler on 05/22/2018 09:17:51 Buhl, [name] ([id number]) -------------------------------------------------------------------------------- ROS/PFSH Details Patient Name: Lopp, Franklin E. Date of Service: 05/22/2018 8:30 AM Medical Record Number: [id number] Patient Account Number: [id number] Date of Birth/Sex: 1950-07-15 (68 y.o. M) Treating RN: Harold Barban Primary Care Provider: Lamonte Sakai Other Clinician: Referring Provider: Lamonte Sakai Treating Provider/Extender: Melburn Hake, HOYT Weeks in Treatment: 78 Information Obtained From Patient Wound History Do you currently have one or more open woundso Yes How many open wounds do you currently haveo 6 Approximately how long have you had your woundso 3 months How have you been treating your wound(s) until nowo ointment and bandage Has your wound(s) ever healed and then re-openedo No Have you had any lab work done in the past montho No Have you tested positive for an antibiotic resistant organism (MRSA, VRE)o No Have you tested positive for osteomyelitis (bone infection)o No Have you had any tests for circulation on your legso Yes Who ordered the testo PCP Where was the test doneo [facility] Constitutional Symptoms (General Health) Complaints and Symptoms: Negative for: Fever; Chills Eyes Medical History: Negative for: Cataracts; Glaucoma; Optic Neuritis Ear/Nose/Mouth/Throat Medical History: Negative for: Chronic sinus problems/congestion; Middle ear problems Hematologic/Lymphatic Medical History: Positive for: Anemia; Lymphedema Negative for: Hemophilia; Human Immunodeficiency Virus; Sickle Cell  Disease Respiratory Complaints and Symptoms: No Complaints or Symptoms Medical History: Negative for: Aspiration; Asthma; Chronic Obstructive Pulmonary Disease (COPD); Pneumothorax; Sleep Apnea; Tuberculosis Cardiovascular Complaints and Symptoms: No Complaints or Symptoms Feider, Mitsuru E. ([id number]) Medical History: Positive for: Congestive Heart Failure; Hypertension; Peripheral Venous Disease Negative for: Angina; Arrhythmia; Coronary Artery Disease; Deep Vein Thrombosis; Hypotension; Myocardial Infarction; Peripheral Arterial Disease; Phlebitis; Vasculitis Gastrointestinal Medical History: Negative for: Cirrhosis ; Colitis; Crohnos; Hepatitis A; Hepatitis B; Hepatitis C Endocrine Medical History: Positive for: Type II Diabetes Treated with: Oral agents Blood sugar tested every day: Yes Tested : QD Genitourinary Medical History: Negative for: End Stage Renal Disease Immunological Medical History: Negative for: Lupus Erythematosus; Raynaudos; Scleroderma Integumentary (Skin) Medical History: Negative for: History of Burn; History of pressure wounds Musculoskeletal Medical History: Negative for: Gout; Rheumatoid Arthritis; Osteoarthritis; Osteomyelitis Neurologic Medical History: Positive for: Neuropathy Negative for: Dementia; Quadriplegia; Paraplegia; Seizure Disorder Oncologic Medical History: Past Medical History Notes: 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 ([id number]) Immunization Notes: up to date Implantable Devices Family and Social History Current every day smoker; Marital Status - Widowed; Alcohol Use: Daily - quit drinking about a week ago; Drug Use: No History; Caffeine Use: Moderate; Financial Concerns: No; Food, Clothing or Shelter Needs: No; Support System Lacking: No;  Transportation Concerns: No; Advanced Directives: No; Patient does not want information on Advanced Directives Physician Affirmation I have reviewed and agree with the above information. Electronic Signature(s) Signed: 05/23/2018 8:42:52 AM By: Worthy Keeler PA-C Signed: 05/26/2018 9:36:27 AM By: Harold Barban Entered By: Worthy Keeler on 05/22/2018 09:15:19 Mcauliff, [name] ([id number]) -------------------------------------------------------------------------------- SuperBill Details Patient  Name: Branden, [name] [name]. Date of Service: 05/22/2018 Medical Record Number: [id number] Patient Account Number: [id number] Date of Birth/Sex: Dec 15, 1950 (68 y.o. 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 E11.621 Type 2 diabetes mellitus with foot ulcer I87.323 Chronic venous hypertension (idiopathic) with inflammation of bilateral lower extremity I89.0 Lymphedema, not elsewhere classified F17.218 Nicotine dependence, cigarettes, 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 abuse 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, right ankle and foot Facility Procedures CPT4 Code: 22449753 Description: 99214 - WOUND CARE VISIT-LEV 4 EST PT Modifier: Quantity: 1 Physician Procedures CPT4 Code Description: [id number] 11173 - WC PHYS LEVEL 4 - EST PT ICD-10 Diagnosis Description L97.521 Non-pressure chronic ulcer of other part of left foot limited to L97.511 Non-pressure chronic ulcer of other part of right foot limited t E11.621 Type 2  diabetes mellitus with foot ulcer I87.323 Chronic venous  hypertension (idiopathic) with inflammation of bi Modifier: breakdown of s o breakdown of lateral lower e Quantity: 1 kin skin xtremity Electronic Signature(s) Signed: 05/23/2018 8:42:52 AM By: Worthy Keeler PA-C Entered By: Worthy Keeler on 05/22/2018 09:18:21

## 2018-05-26 NOTE — Progress Notes (Signed)
[name], [name] ([id number]) Visit Report for 05/22/2018 Arrival Information Details Patient Name: [name], [name] Date of Service: 05/22/2018 8:30 AM Medical Record Number: [id number] Patient Account Number: [id number] Date of Birth/Sex: May 28, 1950 (69 y.o. M) Treating RN: [name] Primary Care [name]: [name] Other Clinician: Referring [name]: [name] Treating Rosabel Sermeno/Extender: [name], [name] in Treatment: 30 Visit Information History Since Last Visit Added or deleted any medications: No Patient Arrived: Ambulatory Any new allergies or adverse reactions: No Arrival Time: 08:40 Had a fall or experienced change in No Accompanied By: brother in activities of daily living that may affect law risk of falls: Transfer Assistance: None Signs or symptoms of abuse/neglect since last visito No Patient Identification Verified: Yes Hospitalized since last visit: No Secondary Verification Process Completed: Yes Has Dressing in Place as Prescribed: Yes Patient Requires Transmission-Based No Pain Present Now: No Precautions: Patient Has Alerts: Yes Patient Alerts: DMII Electronic Signature(s) Signed: 05/22/2018 4:04:17 PM By: [name] Entered By: [name], [name] on 05/22/2018 08:41:09 [name] ([id number]) -------------------------------------------------------------------------------- Clinic Level of Care Assessment Details Patient Name: [name], [name] Date of Service: 05/22/2018 8:30 AM Medical Record Number: [id number] Patient Account Number: [id number] Date of Birth/Sex: 11-19-1950 (68 y.o. M) Treating RN: [name] Primary Care [name]: [name] Other Clinician: Referring [name]: [name] Treating [name]/Extender: [name], [name] in Treatment: 56 Clinic Level of Care Assessment Items TOOL 4 Quantity Score []  - Use when only an EandM is performed on FOLLOW-UP visit 0 ASSESSMENTS -  Nursing Assessment / Reassessment []  - Reassessment of Co-morbidities (includes updates in patient status) 0 X- 1 5 Reassessment of Adherence to Treatment Plan ASSESSMENTS - Wound and Skin Assessment / Reassessment []  - Simple Wound Assessment / Reassessment - one wound 0 X- 5 5 Complex Wound Assessment / Reassessment - multiple wounds []  - 0 Dermatologic / Skin Assessment (not related to wound area) ASSESSMENTS - Focused Assessment []  - Circumferential Edema Measurements - multi extremities 0 []  - 0 Nutritional Assessment / Counseling / Intervention []  - 0 Lower Extremity Assessment (monofilament, tuning fork, pulses) []  - 0 Peripheral Arterial Disease Assessment (using hand held doppler) ASSESSMENTS - Ostomy and/or Continence Assessment and Care []  - Incontinence Assessment and Management 0 []  - 0 Ostomy Care Assessment and Management (repouching, etc.) PROCESS - Coordination of Care X - Simple Patient / Family Education for ongoing care 1 15 []  - 0 Complex (extensive) Patient / Family Education for ongoing care []  - 0 Staff obtains Programmer, systems, Records, Test Results / Process Orders []  - 0 Staff telephones HHA, Nursing Homes / Clarify orders / etc []  - 0 Routine Transfer to another Facility (non-emergent condition) []  - 0 Routine Hospital Admission (non-emergent condition) []  - 0 New Admissions / Biomedical engineer / Ordering NPWT, Apligraf, etc. []  - 0 Emergency Hospital Admission (emergent condition) X- 1 10 Simple Discharge Coordination [name], [name] ([id number]) []  - 0 Complex (extensive) Discharge Coordination PROCESS - Special Needs []  - Pediatric / Minor Patient Management 0 []  - 0 Isolation Patient Management []  - 0 Hearing / Language / Visual special needs []  - 0 Assessment of Community assistance (transportation, D/C planning, etc.) []  - 0 Additional assistance / Altered mentation []  - 0 Support Surface(s) Assessment (bed, cushion, seat,  etc.) INTERVENTIONS - Wound Cleansing / Measurement []  - Simple Wound Cleansing - one wound 0 X- 5 5 Complex Wound Cleansing - multiple wounds X- 1 5 Wound Imaging (photographs - any  number of wounds) []  - 0 Wound Tracing (instead of photographs) []  - 0 Simple Wound Measurement - one wound X- 5 5 Complex Wound Measurement - multiple wounds INTERVENTIONS - Wound Dressings []  - Small Wound Dressing one or multiple wounds 0 X- 2 15 Medium Wound Dressing one or multiple wounds []  - 0 Large Wound Dressing one or multiple wounds []  - 0 Application of Medications - topical []  - 0 Application of Medications - injection INTERVENTIONS - Miscellaneous []  - External ear exam 0 []  - 0 Specimen Collection (cultures, biopsies, blood, body fluids, etc.) []  - 0 Specimen(s) / Culture(s) sent or taken to Lab for analysis []  - 0 Patient Transfer (multiple staff / Civil Service fast streamer / Similar devices) []  - 0 Simple Staple / Suture removal (25 or less) []  - 0 Complex Staple / Suture removal (26 or more) []  - 0 Hypo / Hyperglycemic Management (close monitor of Blood Glucose) []  - 0 Ankle / Brachial Index (ABI) - do not check if billed separately X- 1 5 Vital Signs [name], [name] ([id number]) Has the patient been seen at the hospital within the last three years: Yes Total Score: 145 Level Of Care: New/Established - Level 4 Electronic Signature(s) Signed: 05/26/2018 8:46:05 AM By: [name] Entered By: [name] on 05/22/2018 09:14:33 Sahm, [name] ([id number]) -------------------------------------------------------------------------------- Encounter Discharge Information Details Patient Name: [name] Date of Service: 05/22/2018 8:30 AM Medical Record Number: [id number] Patient Account Number: [id number] Date of Birth/Sex: 1950/07/27 (68 y.o. M) Treating RN: [name] Primary Care Maliik Karner: [name] Other Clinician: Referring [name]: [name] Treating  [name]/Extender: [name], [name] in Treatment: 13 Encounter Discharge Information Items Discharge Condition: Stable Ambulatory Status: Ambulatory Discharge Destination: Home Transportation: Private Auto Accompanied By: family Schedule Follow-up Appointment: Yes Clinical Summary of Care: Electronic Signature(s) Signed: 05/26/2018 9:36:27 AM By: [name] Entered By: [name] on 05/22/2018 09:30:36 Zenker, [name] ([id number]) -------------------------------------------------------------------------------- Lower Extremity Assessment Details Patient Name: [name], [name] Date of Service: 05/22/2018 8:30 AM Medical Record Number: [id number] Patient Account Number: [id number] Date of Birth/Sex: [birth year] (68 y.o. M) Treating RN: Montey Hora Primary Care Andru Genter: [name] Other Clinician: Referring Korby Ratay: [name] Treating Ramie Palladino/Extender: [name], [name] in Treatment: 85 Edema Assessment Assessed: [Left: No] [Right: No] Edema: [Left: Yes] [Right: Yes] Vascular Assessment Pulses: Dorsalis Pedis Palpable: [Left:Yes] [Right:Yes] Posterior Tibial Extremity colors, hair growth, and conditions: Extremity Color: [Left:Hyperpigmented] [Right:Hyperpigmented] Hair Growth on Extremity: [Left:No] [Right:No] Temperature of Extremity: [Left:Warm] [Right:Warm] Capillary Refill: [Left:< 3 seconds] [Right:< 3 seconds] Toe Nail Assessment Left: Right: Thick: No No Discolored: No No Deformed: Yes Yes Improper Length and Hygiene: No No Electronic Signature(s) Signed: 05/22/2018 4:47:47 PM By: Montey Hora Entered By: Montey Hora on 05/22/2018 08:58:17 Blackburn, Arshad [name]. ([id number]) -------------------------------------------------------------------------------- Multi Wound Chart Details Patient Name: Hollingworth, [name] Date of Service: 05/22/2018 8:30 AM Medical Record Number: [id number] Patient Account Number: [id number] Date of Birth/Sex: 1950/07/25  (68 y.o. M) Treating RN: [name] Primary Care Noelani Harbach: [name] Other Clinician: Referring Rudransh Bellanca: [name] Treating Fredericka Bottcher/Extender: [name], [name] in Treatment: 21 Vital Signs Height(in): 69 Pulse(bpm): 14 Weight(lbs): 168 Blood Pressure(mmHg): 158/75 Body Mass Index(BMI): 25 Temperature(F): 98.2 Respiratory Rate 16 (breaths/min): Photos: [16:No Photos] [17:No Photos] [18:No Photos] Wound Location: [16:Right Toe Great - Plantar] [17:Right Metatarsal head second - Plantar, Distal] [18:Right Toe Great - Dorsal] Wounding Event: [16:Not Known] [17:Trauma] [18:Gradually Appeared] 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] [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] [  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] Comorbid History: [16:Anemia, Lymphedema, Congestive Heart Failure, Hypertension, Peripheral Venous Disease, Type II Diabetes, Neuropathy] [17:Anemia, Lymphedema, Congestive Heart Failure, Hypertension, Peripheral Venous Disease, Type II Diabetes,  Neuropathy] [18:Anemia, Lymphedema, Congestive Heart Failure, Hypertension, Peripheral Venous Disease, Type II Diabetes, Neuropathy] Date Acquired: [16:10/29/2017] [17:02/03/2018] [18:05/22/2018] [name] of Treatment: [16:29] [17:15] [18:0] Wound Status: [16:Open] [17:Open] [18:Open] Pending Amputation on [16:No] [17:No] [18:No] Presentation: Measurements L x W x D [16:2.1x1.5x0.1] [17:1.3x0.4x0.1] [18:0.6x1.7x0.1] (cm) Area (cm) : [16:2.474] [17:0.408] [18:0.801] Volume (cm) : [16:0.247] [17:0.041] [18:0.08] % Reduction in Area: [16:-54.40%] [17:-15.60%] [18:N/A] % Reduction in Volume: [16:-54.40%] [17:-17.10%] [18:N/A] Classification: [16:Grade 1] [17:Grade 1] [18:Grade 1] Exudate Amount: [16:Medium] [17:Medium] [18:Medium] Exudate Type: [16:Serous] [17:Serous] [18:Serous] Exudate Color: [16:amber] [17:amber] [18:amber] Wound Margin: [16:Flat and Intact] [17:Flat and  Intact] [18:Flat and Intact] Granulation Amount: [16:Large (67-100%)] [17:Large (67-100%)] [18:Large (67-100%)] Granulation Quality: [16:Pink] [17:Pink] [18:Red] Necrotic Amount: [16:Small (1-33%)] [17:Small (1-33%)] [18:None Present (0%)] Exposed Structures: [16:Fat Layer (Subcutaneous Tissue) Exposed: Yes Fascia: No Tendon: No Muscle: No Joint: No Bone: No] [17:Fat Layer (Subcutaneous Tissue) Exposed: Yes Fascia: No Tendon: No Muscle: No Joint: No Bone: No] [18:Fat Layer (Subcutaneous Tissue) Exposed: Yes  Fascia: No Tendon: No Muscle: No Joint: No Bone: No] Epithelialization: Small (1-33%) Medium (34-66%) Small (1-33%) Periwound Skin Texture: Excoriation: No Excoriation: No Excoriation: No Induration: No Induration: No Induration: No Callus: No Callus: No Callus: No Crepitus: No Crepitus: No Crepitus: No Rash: No Rash: No Rash: No Scarring: No Scarring: No Scarring: No Periwound Skin Moisture: Maceration: Yes Maceration: Yes Maceration: No Dry/Scaly: No Dry/Scaly: No Dry/Scaly: No Periwound Skin Color: Atrophie Blanche: No Atrophie Blanche: No Atrophie Blanche: No Cyanosis: No Cyanosis: No Cyanosis: No Ecchymosis: No Ecchymosis: No Ecchymosis: No Erythema: No Erythema: No Erythema: No Hemosiderin Staining: No Hemosiderin Staining: No Hemosiderin Staining: No Mottled: No Mottled: No Mottled: No Pallor: No Pallor: No Pallor: No Rubor: No Rubor: No Rubor: No Temperature: No Abnormality No Abnormality No Abnormality Tenderness on Palpation: No No No Wound Preparation: Ulcer Cleansing: Ulcer Cleansing: Ulcer Cleansing: Rinsed/Irrigated with Saline Rinsed/Irrigated with Saline Rinsed/Irrigated with Saline Topical Anesthetic Applied: Topical Anesthetic Applied: Topical Anesthetic Applied: Other: lidocaine 4% Other: lidocaine 4% None Wound Number: 2 6 N/A Photos: No Photos No Photos N/A Wound Location: Right Toe Second Left Toe Second N/A Wounding  Event: Gradually Appeared Gradually Appeared N/A Primary Etiology: Diabetic Wound/Ulcer of the Diabetic Wound/Ulcer of the N/A Lower Extremity Lower Extremity Comorbid History: Anemia, Lymphedema, Anemia, Lymphedema, N/A Congestive Heart Failure, Congestive Heart Failure, Hypertension, Peripheral Hypertension, Peripheral Venous Disease, Type II Venous Disease, Type II Diabetes, Neuropathy Diabetes, Neuropathy Date Acquired: 06/11/2016 06/11/2016 N/A [name] of Treatment: 86 86 N/A Wound Status: Open Open N/A Pending Amputation on Yes Yes N/A Presentation: Measurements L x W x D 0.5x0.6x0.1 1.4x1.6x0.1 N/A (cm) Area (cm) : 0.236 1.759 N/A Volume (cm) : 0.024 0.176 N/A % Reduction in Area: 92.30% 17.60% N/A % Reduction in Volume: 92.20% 17.80% N/A Classification: Grade 2 Grade 2 N/A Exudate Amount: Medium Medium N/A Exudate Type: Serous Serous N/A Exudate Color: amber amber N/A Wound Margin: Indistinct, nonvisible Indistinct, nonvisible N/A Granulation Amount: Large (67-100%) Large (67-100%) N/A Granulation Quality: Pink Pink N/A Necrotic Amount: None Present (0%) None Present (0%) N/A Exposed Structures: Fascia: No Fascia: No N/A Fat Layer (Subcutaneous Fat Layer (Subcutaneous Tissue) Exposed: No Tissue) Exposed: No [name], [name] ([id number]) Tendon: No Tendon: No Muscle: No Muscle: No Joint: No Joint: No Bone: No Bone: No Limited to Skin  Breakdown Limited to Skin Breakdown Epithelialization: Large (67-100%) Medium (34-66%) N/A Periwound Skin Texture: Excoriation: No Excoriation: No N/A Induration: No Induration: No Callus: No Callus: No Crepitus: No Crepitus: No Rash: No Rash: No Scarring: No Scarring: No Periwound Skin Moisture: Maceration: No Maceration: Yes N/A Dry/Scaly: No Dry/Scaly: No Periwound Skin Color: Atrophie Blanche: No Atrophie Blanche: No N/A Cyanosis: No Cyanosis: No Ecchymosis: No Ecchymosis: No Erythema: No Erythema:  No Hemosiderin Staining: No Hemosiderin Staining: No Mottled: No Mottled: No Pallor: No Pallor: No Rubor: No Rubor: No Temperature: No Abnormality No Abnormality N/A Tenderness on Palpation: No No N/A Wound Preparation: Ulcer Cleansing: Ulcer Cleansing: N/A Rinsed/Irrigated with Saline Rinsed/Irrigated with Saline Topical Anesthetic Applied: Topical Anesthetic Applied: None None Treatment Notes Electronic Signature(s) Signed: 05/26/2018 8:46:05 AM By: [name] Entered By: [name] on 05/22/2018 09:09:10 Leija, [name] ([id number]) -------------------------------------------------------------------------------- [facility] Details Patient Name: Schneller, Yamir [name]. Date of Service: 05/22/2018 8:30 AM Medical Record Number: [id number] Patient Account Number: [id number] Date of Birth/Sex: [birth year] (68 y.o. M) Treating RN: [name] Primary Care Evalena Fujii: [name] Other Clinician: Referring Price Lachapelle: [name] Treating Myha Arizpe/Extender: [name], [name] in Treatment: 30 Active Inactive Abuse / Safety / Falls / Self Care Management Nursing Diagnoses: Potential for falls Goals: Patient will remain injury free related to falls Date Initiated: 09/21/2016 Target Resolution Date: 09/14/2017 Goal Status: Active Interventions: Assess fall risk on admission and as needed Notes: Nutrition Nursing Diagnoses: Potential for alteratiion in Nutrition/Potential for imbalanced nutrition Goals: Patient/caregiver agrees to and verbalizes understanding of need to use nutritional supplements and/or vitamins as prescribed Date Initiated: 09/21/2016 Target Resolution Date: 09/14/2017 Goal Status: Active Interventions: Assess patient nutrition upon admission and as needed per policy Notes: Orientation to the Wound Care Program Nursing Diagnoses: Knowledge deficit related to the wound healing center program Goals: Patient/caregiver will verbalize  understanding of the [facility] Program Date Initiated: 09/21/2016 Target Resolution Date: 06/15/2017 Goal Status: Active Interventions: Provide education on orientation to the wound center Sereno, Jud [name]. ([id number]) Notes: Wound/Skin Impairment Nursing Diagnoses: Knowledge deficit related to smoking impact on wound healing Goals: Ulcer/skin breakdown will have a volume reduction of 30% by week 4 Date Initiated: 09/21/2016 Target Resolution Date: 08/17/2017 Goal Status: Active Ulcer/skin breakdown will have a volume reduction of 50% by week 8 Date Initiated: 09/21/2016 Target Resolution Date: 08/17/2017 Goal Status: Active Ulcer/skin breakdown will have a volume reduction of 80% by week 12 Date Initiated: 09/21/2016 Target Resolution Date: 09/14/2017 Goal Status: Active Ulcer/skin breakdown will heal within 14 [name] Date Initiated: 09/21/2016 Target Resolution Date: 08/17/2017 Goal Status: Active Interventions: Assess patient/caregiver ability to obtain necessary supplies Assess patient/caregiver ability to perform ulcer/skin care regimen upon admission and as needed Assess ulceration(s) every visit Notes: Electronic Signature(s) Signed: 05/26/2018 8:46:05 AM By: [name] Entered By: [name] on 05/22/2018 09:08:18 Winsett, Davaris [name]. ([id number]) -------------------------------------------------------------------------------- Pain Assessment Details Patient Name: Reveles, Jaymie [name]. Date of Service: 05/22/2018 8:30 AM Medical Record Number: [id number] Patient Account Number: [id number] Date of Birth/Sex: Jan 29, 1951 (68 y.o. M) Treating RN: [name] Primary Care Nixie Laube: [name] Other Clinician: Referring Marialuisa Basara: [name] Treating Titilayo Hagans/Extender: [name], [name] in Treatment: 30 Active Problems Location of Pain Severity and Description of Pain Patient Has Paino No Site Locations Pain Management and Medication Current Pain  Management: Electronic Signature(s) Signed: 05/22/2018 4:04:17 PM By: [name] Signed: 05/26/2018 9:36:27 AM By: [name] Entered By: [name] on 05/22/2018 08:41:17 Schomer,  KERY BATZEL ([id number]) -------------------------------------------------------------------------------- Patient/Caregiver Education Details Patient Name: Seelbach, SPENCER CARDINAL. Date of Service: 05/22/2018 8:30 AM Medical Record Number: [id number] Patient Account Number: [id number] Date of Birth/Gender: [birth year] (68 y.o. M) Treating RN: [name] Primary Care Physician: [name] Other Clinician: Referring Physician: Lamonte [name] Treating Physician/Extender: Sharalyn Ink in Treatment: 38 Education Assessment Education Provided To: Patient Education Topics Provided Wound/Skin Impairment: Handouts: Caring for Your Ulcer, Other: continue wound care as prescribed Methods: Demonstration Responses: State content correctly Electronic Signature(s) Signed: 05/26/2018 9:36:27 AM By: [name] Entered By: [name] on 05/22/2018 09:30:41 Shambley, [name] ([id number]) -------------------------------------------------------------------------------- Wound Assessment Details Patient Name: Lacey, Sender [name]. Date of Service: 05/22/2018 8:30 AM Medical Record Number: [id number] Patient Account Number: [id number] Date of Birth/Sex: 01-Feb-1951 (68 y.o. M) Treating RN: Montey Hora Primary Care Sarabelle Genson: [name] Other Clinician: Referring Meline Russaw: [name] Treating Cederick Broadnax/Extender: [name], [name] in Treatment: 14 Wound Status Wound Number: 16 Primary Diabetic Wound/Ulcer of the Lower Extremity Etiology: Wound Location: Right Toe Great - Plantar Wound Open Wounding Event: Not Known Status: Date Acquired: 10/29/2017 Comorbid Anemia, Lymphedema, Congestive Heart [name] Of Treatment: 29 History: Failure, Hypertension, Peripheral  Venous Clustered Wound: No Disease, Type II Diabetes, Neuropathy Photos Photo Uploaded By: Montey Hora on 05/22/2018 10:13:59 Wound Measurements Length: (cm) 2.1 Width: (cm) 1.5 Depth: (cm) 0.1 Area: (cm) 2.474 Volume: (cm) 0.247 % Reduction in Area: -54.4% % Reduction in Volume: -54.4% Epithelialization: Small (1-33%) Tunneling: No Undermining: No Wound Description Classification: Grade 1 Wound Margin: Flat and Intact Exudate Amount: Medium Exudate Type: Serous Exudate Color: amber Foul Odor After Cleansing: No Slough/Fibrino Yes Wound Bed Granulation Amount: Large (67-100%) Exposed Structure Granulation Quality: Pink Fascia Exposed: No Necrotic Amount: Small (1-33%) Fat Layer (Subcutaneous Tissue) Exposed: Yes Necrotic Quality: Adherent Slough Tendon Exposed: No Muscle Exposed: No Joint Exposed: No Bone Exposed: No Periwound Skin Texture [name], [name] ([id number]) Texture Color No Abnormalities Noted: No No Abnormalities Noted: No Callus: No Atrophie Blanche: No Crepitus: No Cyanosis: No Excoriation: No Ecchymosis: No Induration: No Erythema: No Rash: No Hemosiderin Staining: No Scarring: No Mottled: No Pallor: No Moisture Rubor: No No Abnormalities Noted: No Dry / Scaly: No Temperature / Pain Maceration: Yes Temperature: No Abnormality Wound Preparation Ulcer Cleansing: Rinsed/Irrigated with Saline Topical Anesthetic Applied: Other: lidocaine 4%, Treatment Notes Wound #16 (Right, Plantar Toe Great) Notes nystatin powder, silver alginate, conform, tape Electronic Signature(s) Signed: 05/22/2018 4:47:47 PM By: Montey Hora Entered By: Montey Hora on 05/22/2018 08:52:10 Jakes, [name] ([id number]) -------------------------------------------------------------------------------- Wound Assessment Details Patient Name: Strada, Montez [name]. Date of Service: 05/22/2018 8:30 AM Medical Record Number: [id number] Patient Account Number:  [id number] Date of Birth/Sex: [birth year] (68 y.o. M) Treating RN: Montey Hora Primary Care Toriann Spadoni: [name] Other Clinician: Referring Enas Winchel: [name] Treating Elianie Hubers/Extender: [name], [name] in Treatment: 20 Wound Status Wound Number: 17 Primary Diabetic Wound/Ulcer of the Lower Extremity Etiology: Wound Location: Right Metatarsal head second - Plantar, Distal Wound Open Status: Wounding Event: Trauma Comorbid Anemia, Lymphedema, Congestive Heart Date Acquired: 02/03/2018 History: Failure, Hypertension, Peripheral Venous [name] Of Treatment: 15 Disease, Type II Diabetes, Neuropathy Clustered Wound: No Photos Photo Uploaded By: Montey Hora on 05/22/2018 10:14:34 Wound Measurements Length: (cm) 1.3 Width: (cm) 0.4 Depth: (cm) 0.1 Area: (cm) 0.408 Volume: (cm) 0.041 % Reduction in Area: -15.6% % Reduction in Volume: -17.1% Epithelialization: Medium (34-66%) Tunneling: No Undermining: No Wound Description Classification: Grade 1 Wound Margin: Flat and Intact Exudate Amount: Medium Exudate Type: Serous  Exudate Color: amber Foul Odor After Cleansing: No Slough/Fibrino Yes Wound Bed Granulation Amount: Large (67-100%) Exposed Structure Granulation Quality: Pink Fascia Exposed: No Necrotic Amount: Small (1-33%) Fat Layer (Subcutaneous Tissue) Exposed: Yes Necrotic Quality: Adherent Slough Tendon Exposed: No Muscle Exposed: No Joint Exposed: No Bone Exposed: No Periwound Skin Texture [name], [name] ([id number]) Texture Color No Abnormalities Noted: No No Abnormalities Noted: No Callus: No Atrophie Blanche: No Crepitus: No Cyanosis: No Excoriation: No Ecchymosis: No Induration: No Erythema: No Rash: No Hemosiderin Staining: No Scarring: No Mottled: No Pallor: No Moisture Rubor: No No Abnormalities Noted: No Dry / Scaly: No Temperature / Pain Maceration: Yes Temperature: No Abnormality Wound Preparation Ulcer Cleansing:  Rinsed/Irrigated with Saline Topical Anesthetic Applied: Other: lidocaine 4%, Treatment Notes Wound #17 (Right, Distal, Plantar Metatarsal head second) Notes nystatin powder, silver alginate, conform, tape Electronic Signature(s) Signed: 05/22/2018 4:47:47 PM By: Montey Hora Entered By: Montey Hora on 05/22/2018 08:52:56 Ballester, [name] ([id number]) -------------------------------------------------------------------------------- Wound Assessment Details Patient Name: [name], [name] Date of Service: 05/22/2018 8:30 AM Medical Record Number: [id number] Patient Account Number: [id number] Date of Birth/Sex: [birth year] (68 y.o. M) Treating RN: Montey Hora Primary Care Ferrin Liebig: [name] Other Clinician: Referring Loral Campi: [name] Treating Robecca Fulgham/Extender: [name], [name] in Treatment: 34 Wound Status Wound Number: 18 Primary Diabetic Wound/Ulcer of the Lower Extremity Etiology: Wound Location: Right Toe Great - Dorsal Wound Open Wounding Event: Gradually Appeared Status: Date Acquired: 05/22/2018 Comorbid Anemia, Lymphedema, Congestive Heart [name] Of Treatment: 0 History: Failure, Hypertension, Peripheral Venous Clustered Wound: No Disease, Type II Diabetes, Neuropathy Photos Photo Uploaded By: Montey Hora on 05/22/2018 10:14:35 Wound Measurements Length: (cm) 0.6 Width: (cm) 1.7 Depth: (cm) 0.1 Area: (cm) 0.801 Volume: (cm) 0.08 % Reduction in Area: % Reduction in Volume: Epithelialization: Small (1-33%) Tunneling: No Undermining: No Wound Description Classification: Grade 1 Wound Margin: Flat and Intact Exudate Amount: Medium Exudate Type: Serous Exudate Color: amber Foul Odor After Cleansing: No Slough/Fibrino No Wound Bed Granulation Amount: Large (67-100%) Exposed Structure Granulation Quality: Red Fascia Exposed: No Necrotic Amount: None Present (0%) Fat Layer (Subcutaneous Tissue) Exposed: Yes Tendon Exposed: No Muscle  Exposed: No Joint Exposed: No Bone Exposed: No Periwound Skin Texture [name], [name] ([id number]) Texture Color No Abnormalities Noted: No No Abnormalities Noted: No Callus: No Atrophie Blanche: No Crepitus: No Cyanosis: No Excoriation: No Ecchymosis: No Induration: No Erythema: No Rash: No Hemosiderin Staining: No Scarring: No Mottled: No Pallor: No Moisture Rubor: No No Abnormalities Noted: No Dry / Scaly: No Temperature / Pain Maceration: No Temperature: No Abnormality Wound Preparation Ulcer Cleansing: Rinsed/Irrigated with Saline Topical Anesthetic Applied: None Treatment Notes Wound #18 (Right, Dorsal Toe Great) Notes nystatin powder, silver alginate, conform, tape Electronic Signature(s) Signed: 05/22/2018 4:47:47 PM By: Montey Hora Entered By: Montey Hora on 05/22/2018 08:57:46 Borba, [name] ([id number]) -------------------------------------------------------------------------------- Wound Assessment Details Patient Name: [name], [name] Date of Service: 05/22/2018 8:30 AM Medical Record Number: [id number] Patient Account Number: [id number] Date of Birth/Sex: February 27, 1951 (68 y.o. M) Treating RN: Montey Hora Primary Care Yamen Castrogiovanni: [name] Other Clinician: Referring Massiah Longanecker: [name] Treating Rowe Warman/Extender: [name], [name] in Treatment: 109 Wound Status Wound Number: 2 Primary Diabetic Wound/Ulcer of the Lower Extremity Etiology: Wound Location: Right Toe Second Wound Open Wounding Event: Gradually Appeared Status: Date Acquired: 06/11/2016 Comorbid Anemia, Lymphedema, Congestive Heart [name] Of Treatment: 86 History: Failure, Hypertension, Peripheral Venous Clustered Wound: No Disease, Type II Diabetes, Neuropathy Pending Amputation On Presentation Photos Photo Uploaded By: Montey Hora  on 05/22/2018 10:15:02 Wound Measurements Length: (cm) 0.5 Width: (cm) 0.6 Depth: (cm) 0.1 Area: (cm) 0.236 Volume: (cm) 0.024 %  Reduction in Area: 92.3% % Reduction in Volume: 92.2% Epithelialization: Large (67-100%) Tunneling: No Undermining: No Wound Description Classification: Grade 2 Wound Margin: Indistinct, nonvisible Exudate Amount: Medium Exudate Type: Serous Exudate Color: amber Foul Odor After Cleansing: No Slough/Fibrino No Wound Bed Granulation Amount: Large (67-100%) Exposed Structure Granulation Quality: Pink Fascia Exposed: No Necrotic Amount: None Present (0%) Fat Layer (Subcutaneous Tissue) Exposed: No Tendon Exposed: No Muscle Exposed: No Joint Exposed: No Bone Exposed: No Limited to Skin Breakdown Teska, Babak [name]. ([id number]) Periwound Skin Texture Texture Color No Abnormalities Noted: No No Abnormalities Noted: No Callus: No Atrophie Blanche: No Crepitus: No Cyanosis: No Excoriation: No Ecchymosis: No Induration: No Erythema: No Rash: No Hemosiderin Staining: No Scarring: No Mottled: No Pallor: No Moisture Rubor: No No Abnormalities Noted: No Dry / Scaly: No Temperature / Pain Maceration: No Temperature: No Abnormality Wound Preparation Ulcer Cleansing: Rinsed/Irrigated with Saline Topical Anesthetic Applied: None Treatment Notes Wound #2 (Right Toe Second) Notes nystatin powder, silver alginate, conform, tape Electronic Signature(s) Signed: 05/22/2018 4:47:47 PM By: Montey Hora Entered By: Montey Hora on 05/22/2018 08:53:54 Aikey, Tannar [name]. ([id number]) -------------------------------------------------------------------------------- Wound Assessment Details Patient Name: [name], [name] Date of Service: 05/22/2018 8:30 AM Medical Record Number: [id number] Patient Account Number: [id number] Date of Birth/Sex: 1951-01-15 (68 y.o. M) Treating RN: Montey Hora Primary Care Hoang Pettingill: [name] Other Clinician: Referring Aharon Carriere: [name] Treating Aletheia Tangredi/Extender: [name], [name] in Treatment: 25 Wound Status Wound Number: 6 Primary  Diabetic Wound/Ulcer of the Lower Extremity Etiology: Wound Location: Left Toe Second Wound Open Wounding Event: Gradually Appeared Status: Date Acquired: 06/11/2016 Comorbid Anemia, Lymphedema, Congestive Heart [name] Of Treatment: 86 History: Failure, Hypertension, Peripheral Venous Clustered Wound: No Disease, Type II Diabetes, Neuropathy Pending Amputation On Presentation Photos Photo Uploaded By: Montey Hora on 05/22/2018 10:15:03 Wound Measurements Length: (cm) 1.4 Width: (cm) 1.6 Depth: (cm) 0.1 Area: (cm) 1.759 Volume: (cm) 0.176 % Reduction in Area: 17.6% % Reduction in Volume: 17.8% Epithelialization: Medium (34-66%) Tunneling: No Undermining: No Wound Description Classification: Grade 2 Wound Margin: Indistinct, nonvisible Exudate Amount: Medium Exudate Type: Serous Exudate Color: amber Foul Odor After Cleansing: No Slough/Fibrino No Wound Bed Granulation Amount: Large (67-100%) Exposed Structure Granulation Quality: Pink Fascia Exposed: No Necrotic Amount: None Present (0%) Fat Layer (Subcutaneous Tissue) Exposed: No Tendon Exposed: No Muscle Exposed: No Joint Exposed: No Bone Exposed: No Limited to Skin Breakdown Blinder, Skyler [name]. ([id number]) Periwound Skin Texture Texture Color No Abnormalities Noted: No No Abnormalities Noted: No Callus: No Atrophie Blanche: No Crepitus: No Cyanosis: No Excoriation: No Ecchymosis: No Induration: No Erythema: No Rash: No Hemosiderin Staining: No Scarring: No Mottled: No Pallor: No Moisture Rubor: No No Abnormalities Noted: No Dry / Scaly: No Temperature / Pain Maceration: Yes Temperature: No Abnormality Wound Preparation Ulcer Cleansing: Rinsed/Irrigated with Saline Topical Anesthetic Applied: None Treatment Notes Wound #6 (Left Toe Second) Notes nystatin powder, silver alginate, conform, tape Electronic Signature(s) Signed: 05/22/2018 4:47:47 PM By: Montey Hora Entered By: Montey Hora  on 05/22/2018 08:54:45 Leisey, [name] ([id number]) -------------------------------------------------------------------------------- Vitals Details Patient Name: Strege, Seann [name]. Date of Service: 05/22/2018 8:30 AM Medical Record Number: [id number] Patient Account Number: [id number] Date of Birth/Sex: April 11, 1950 (68 y.o. M) Treating RN: [name] Primary Care Maziyah Vessel: [name] Other Clinician: Referring Fredda Clarida: [name] Treating Tameria Patti/Extender: [name], [name] in Treatment: 41 Vital Signs Time Taken: 08:41 Temperature (  F): 98.2 Height (in): 69 Pulse (bpm): 69 Weight (lbs): 168 Respiratory Rate (breaths/min): 16 Body Mass Index (BMI): 24.8 Blood Pressure (mmHg): 158/75 Reference Range: 80 - 120 mg / dl Airway Electronic Signature(s) Signed: 05/22/2018 4:04:17 PM By: [name] Entered By: [name] on 05/22/2018 08:44:39

## 2018-06-05 ENCOUNTER — Encounter: Payer: Medicare Other | Admitting: Physician Assistant

## 2018-06-05 DIAGNOSIS — E11621 Type 2 diabetes mellitus with foot ulcer: Secondary | ICD-10-CM | POA: Diagnosis not present

## 2018-06-07 NOTE — Progress Notes (Signed)
[name], [name] ([id number]) Visit Report for 06/05/2018 Chief Complaint Document Details Patient Name: [name], [name] Date of Service: 06/05/2018 8:30 AM Medical Record Number: [id number] Patient Account Number: [id number] Date of Birth/Sex: [birth year] (68 y.o. M) Treating RN: Army Melia Primary Care Provider: Lamonte Sakai Other Clinician: Referring Provider: Lamonte Sakai Treating Provider/Extender: Melburn Hake, HOYT Weeks in Treatment: 1 Information Obtained from: Patient Chief Complaint Patient presents for treatment of an open diabetic ulcer to both feet Electronic Signature(s) Signed: 06/05/2018 5:10:06 PM By: Worthy Keeler PA-C Entered By: Worthy Keeler on 06/05/2018 08:57:34 [name], [name] ([id number]) -------------------------------------------------------------------------------- HPI Details Patient Name: Nordhoff, [name] Date of Service: 06/05/2018 8:30 AM Medical Record Number: [id number] Patient Account Number: [id number] Date of Birth/Sex: 08-Feb-1951 (68 y.o. M) Treating RN: Army Melia Primary Care Provider: Lamonte Sakai Other Clinician: Referring Provider: Lamonte Sakai Treating Provider/Extender: Melburn Hake, HOYT Weeks in Treatment: 35 History of Present Illness HPI Description: 68 year old patient here to see as for bilateral feet ulceration to on his left first and second toe and 2 on his right first and second toe, which she's had for about 4 months. 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 Donella Stade. The patient is also undergoing workup by medical oncology for a intra-abdominal lymphadenopathy. 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 He is also status post appendectomy, inguinal lymph node biopsy, rectal biopsy and  rectal examination under anesthesia. he currently smokes cigarettes about half packet a day. In March of this year he was seen by Dr. Hortencia Pilar, for evaluation of bilateral varicose veins and besides wearing compression stockings he had recommended laser ablation of the right and left great saphenous veins to eleviate the symptoms and complications of severe superficial venous reflux disease. He also recommended lymphedema pumps for better control of his lymphedema. The patient recently has had on 08/23/2016, right greater saphenous vein ablation with the laser energy Earlier lower extremity venous reflux examination done on 05/08/2016 showed no DVT or SVT both lower legs but incompetence of bilateral great saphenous veins was present. A lower arterial study was also done and there was no significant right lower and left lower extremity problems based on a normal toe brachial index bilaterally and the ABI was 1.21 the left and 1.23 on the right. His post ablation venous duplex examination showed successful ablation of the right GS vein with thrombus formation 2 below the right saphenofemoral junction. The deep system was patent without evidence of thrombosis and this was done on 08/30/2016. 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 Addendum: regarding his x-rays done today and x-ray of the left foot -- IMPRESSION: No objective evidence of osteomyelitis. There are soft tissue changes which may reflect cellulitis. X-ray of the right foot -- IMPRESSION:Findings compatible with cellulitis of the toes. No objective evidence of osteomyelitis is observed. 10/01/16 on evaluation today patient's wounds appeared to be doing some better. I did review the x-rays as well which showed no evidence of osteomyelitis although there was evidence on x-ray of cellulitis. He fortunately is not having  any discomfort although he continues to have some swelling. He does not remember being on any antibiotics  recently. 10/15/16 on evaluation today patient's wounds overall appear to be doing better although he does have a new location noted on the left foot. Fortunately he is not having significant pain. It almost has the appearance that something is rubbing on the end of his toes but he wears the open toe shoes and according to what he is telling me never wears anything that would rub on his foot. There is no evidence of infection and specifically no evidence of a fungal infection 10/22/16 On evaluation today patient's wounds appeared to be doing better compared to last week in regard to his bilateral lower extremities. Fortunately I happy with how things are progressing although he still has ulcers I feel like that he is improving and appropriate manner. 11/12/16 on evaluation today patient appears to be doing well in regard to his bilateral feet and the respective wounds. We have been using surrounding her dressings along with an antifungal cream which seems to be doing very well. He has no bilateral dysfunction noticed that the rituals are weight loss at this point. He also has no nausea or vomiting a note purulent discharge. He did see Vein and vascular today and he tells me that they told him he could have surgery for his venous stasis but they did not feel like it was worth it in his words. Fortunately patient's wounds do appear to be getting sneakily better. [name], [name] ([id number]) 11/26/2016 -- he says he is going to have some surgery during this week at [facility] for possibly a colon resection. 12/31/2016 -- the patient has been noncompliant with his smoking and I'm not sure whether he is also started drinking again. He continues to be very nonchalant about his care 01/14/2017 -- the patient's HandP has been reviewed well and I understand he is being compliant with trying to give up  smoking and his local dressing changes. He does not have any surgical options of 4 to him by his vascular surgeons.he was last seen in early August by Dr. Hortencia Pilar who recommended compression stockings,and possibly lymph pumps in 2-3 months after doing a review ultrasound. 01/28/2017 - the patient did not have any fresh complaints but on examination I noted a large lacerated wound on the plantar aspect of his right fourth toe which had a lot of necrotic debris and it probes down to bone. 02/07/2017 -- x-ray of the right foot -- IMPRESSION: Soft tissue swelling about the first through fourth toes consistent with cellulitis. New destructive change in the tuft of the distal phalanx of the great toe is consistent with osteomyelitis. 02/14/2017 -- the patient's MRI is pending this coming Monday and he still continues to smoke. We have again gone over off loading of his wounds in great detail and he says he's been compliant. 02/21/2017 -- MR of the right foot -- IMPRESSION: 1. Soft tissue ulcer at the tip of the first, second and third toe knows. Cortical irregularity and bone marrow edema in the first distal phalanx most concerning for osteomyelitis. Mild marrow edema in the second and third distal phalanx without definite cortical destruction which may reflect early osteomyelitis versus reactive marrow edema. 2. Soft tissue edema surrounding the first phalanx most consistent with cellulitis. the patient was also recently evaluated by his medical oncologist Dr. 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 She is treating him with weekly Procrit. She is also keeping intra-abdominal lymphadenopathy and right lower  lobe lung nodule under observation. 04/04/2017 -- he was seen by Dr. Adrian Prows on 03/25/2017 -- after review he empirically put him on ciprofloxacin and doxycycline as they have good bone penetration and good  bioavailability and it will cover the usual pathogens and diabetic foot osteomyelitis. He will check inflammatory markers and plan a 64-45 week old records. C-reactive protein was 0.3 and the ESR was 72 04/18/17 on evaluation today patient appears to be doing about the same in regard to his lower extremity wounds bilaterally. He has continued to use the antifungal cream which does seem to be beneficial. Nonetheless the ulcers do seem to in some areas be epithelial eyes over and in other areas are still open. He is having no significant discomfort. 04/25/17-he is here in follow-up evaluation for multiple ulcerations to multiple toes bilaterally. He states he did see Dr. Ola Spurr again last week and continues antibiotic therapy. He is voicing no complaints or concerns, will continue with current treatment plan will possibility of adding compression therapy next week after an additional week of treatment/lotions to BLE prescribed by Dr Ola Spurr 05/02/17 he is here in follow up for for multiple ulcers to multiple toes bilaterally. we will stop using antifungal cream and will continue with silvercel and follow up next week 05/09/17-he is here in follow-up for multiple ulcerations to multiple toes bilaterally. There is improvement in appearance. He has not completely stopped using antifungal cream, but admits he has not using it between the toes. He has an appointment with Dr. Ola Spurr on 2/11, continues on doxycycline and Cipro. It has been 5 weeks of antibiotic therapy, we will order plain film xray to evaluate for osteomyelitis next week, prior to follow up with ID. Will continue with silvercel and follow up next week 05/16/17-he is here in follow-up evaluation for multiple ulcerations to multiple toes bilaterally and new wound to the right posterior heel. There is essentially no change in appearance, deteriorating measurements; he has a history of waxing and waning measurements. He admits that he  continues to apply moisturizer/cream/ointment to his toes despite weekly reminders to only apply silvercel to his toes. He states that he thinks the surgical shoe contributed to the superficial ulcer to his posterior heel, he is unable to articulate if this was an area of dry cracked skin as he has a similar area to the left heel. He now is wearing open toed slippers. He has an appointment with Dr. Ola Spurr on 2/11. We have ordered x-rays for her bilateral feet; he was advised to obtain the x-rays today or tomorrow. He will follow-up next week 05/23/17-he is here in follow-up evaluation for multiple ulcerations to multiple toes bilaterally and the right posterior heel. There is improvement in maceration. He has been compliant and not applying any moisturizing agent to his toes. He has been using Lac-Hydrin for his lower extremities with improvement. He did not go to his appointment on Monday with Dr. Ola Spurr secondary to financial concerns. X-rays for her bilateral feet showed: LEFT FOOT with slight erosion of the tuft of the distal phalanges of the left first and second toe suspicious for osteomyelitis, RIGHT FOOT with 1.erosion of the tufts of the distal Butrick, Garion E. ([id number]) phalanges of the right first second and possibly third toes consistent with osteomyelitis, 2 no definitive abnormality of the calcaneus is seen on the images obtained, 3. Plantar calcaneal degenerative spur. We briefly discussed hyperbaric adjunctive therapy for treatment of chronic refractory osteomyelitis. I do not find an a1c in  EMR, will contact PCP for record, or order if needed. He has been encouraged to contact Dr Ola Spurr office regarding the follow-up appointment, encouraged him to inquire about payment plan. We will continue with same treatment plan and follow-up next week. He states he is still taking antibiotics and has "a lot" left. He states he has been taking them as directed, 2 pills twice daily.  According to Dr. Blane Ohara office notes he was originally started on 12/17 for 4 weeks and extended on 1/14 for an additional 4 weeks. He should be done with his antibiotic therapy, he was advised to bring his bottles and to his next appointment, we will contact pharmacy. 05/30/17-he is here in follow-up evaluation for multiple ulcerations to multiple toes bilaterally and the right posterior heel. He is accompanied by his brother-in-law. Wounds are stable. He has yet to make up with Dr. Ola Spurr. We contacted his PCP, with no record of recent A1c we will draw an A1c. His brother-in-law states that he was taken off all of his diabetic medication secondary to kidney function. 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 The antibiotics were counted and have approximately 2 weeks left, although they should be complete. The pill organizer reveals missing days. We discussed the need for consistent medications, to have optimal benefit of medication. He has a cousin that lives with him and he will ask her to check his organizer daily. He has been advised to follow up with Dr Ola Spurr, and will go by the office today. He has been advised to quit smoking. 06/06/17-he is here in follow up evaluation. He has had to make an appointment with Dr. Ola Spurr. He did have blood work obtained, a1c 5. He continues to take antibiotic therapy. Significant improvement in bilateral lower extremity edema with compression therapy. Essentially no change in ulcerations to toes. He states he is "going to try something different" and "let me know next week" if it works; he would not provide any additional information and was encouraged to follow our orders. We will follow up next week 06/13/17-he is here in follow-up evaluation. He has an appointment with Dr. Ola Spurr tomorrow morning. He states he purchased an ointment from  [facility] and applied to his toes for 3 days, he does not remember the name of the ointment. There is improvement to his wounds, minimal maceration. He continues to take antibiotic therapy, this should have been completed last month. His brother-in-law who regularly accompanies his appointments was asked to take the bottles to the appointment tomorrow with Dr. Ola Spurr so he is aware. We will continue with 3 layer compression, and order OPEN TOE compression 20-30mmHg; we will apply compression stockings next week. He continues to smoke, smoked "2 cigarettes" last week 06/20/17 on evaluation today patient did receive his compression stockings which he has with him today for both lower extremities. With that being said he tells me at this point in time that he is very happy to have these he really is not a big fan of the compression wraps that we have been utilizing although they have been of great benefit for him. Nonetheless at this point he does want to switch to the compression stockings. In my opinion as long as he is continuing with compression I'm okay with the stockings or the wraps. 06/27/17-he is here in follow-up evaluation for multiple ulcerations to his bilateral toes. There is some improvement in appearance. He is compliant  in wearing his compression stockings with significant improvement in lower extremity edema. He saw Dr Ola Spurr on 3/8, per his notes they would redraw ESR and CRP; plan to continue antibiotic therapy if these remain elevated. I do not see an ESR or CRP level in Epic. The patient continues to take antibiotics. 07/11/17-He is here in follow-up evaluation for multiple ulcerations to multiple toes bilaterally. He presents with complete epithelialization to the right third toe; there has been no deterioration. He continues on antibiotic therapy. He will follow-up next week 07/18/17-He is here in follow-up evaluation for multiple ulcerations to multiple toes bilaterally. He  continues to make improvement. He continues on antibiotic therapy. He states he has been using something additional to our orders, he does not elaborate but states he will bring it in next week. 07/25/17-He is here in follow up evaluation for multiple ulcerations to bilateral toes. He is stable. He has completed antibiotic therapy. He admits to "filing" his toes after showers each evening, this is what he was referencing last week; he does not filing for the wounds. We will switch to [facility] and monitor for any improvement, he will follow-up next week 08/01/17-He is here in follow-up evaluation. He admits to "picking" at his toes after cleansing yesterday, leading to new areas of tissue loss on the bilateral second toe. There is improvement noted to the bilateral great toe. We will dress toes today and hope that they maintain until Monday where he will come in for a nurse visit. He has been advised, multiple times with expressed verbalization, to change the dressings to silvercel if the dressings get wet prior to [facility] appointment. 08/08/17-He is here in follow-up evaluation for bilateral first and second toe ulcerations. There is significant improvement to all ulcerations since last visit. We will switch to [name] to all wounds and he will continue with nurse visits on a Monday/Thursday schedule and follow-up with me in 2 weeks. He continues to smoke, 1-3 cigarettes per day, and has been encouraged to not smoke until his next follow-up in 2 weeks. 08/15/17 on evaluation today patient's ulcers on his toes actually appear to be doing fairly well the right to ulcers may be a little bit more moist compared to the left I'm not really sure exactly why as the openings appear to be very small and I'm not seeing any evidence of anything significant as far as you lightest or otherwise. Nonetheless this may just be a small setback he's [name], [name] ([id number]) been doing very well with the  [facility] Dressing 08/22/17-He is here in follow-up evaluation for ulcerations to his bilateral first and second toes, there is small/scant amount of drainage noted on today's dressing. He continues with Hydrofera Blue. He continues to smoke, 1 cigarette a day. Voices no complaint or concerns, compliant and compression therapy today. He'll follow-up next week 08/29/17-He is here in follow-up evaluation for ulcerations to his bilateral first and second toes, with a new wound to the left third toe. He states he cut himself while cutting the toenail. He presents today with more maceration and increased measurements; waxing and waning measurements and moisture has been an ongoing issue. He continues to smoke a proximally 1 cigarette per day, but states he has not smoked since Sunday. We will initiate medihoney daily and evaluate next week. He presents today without compression stockings 09/05/17-He is here in follow-up evaluation for ulcerations to bilateral first and second and left third toe. He did not pick up the  medihoney and therefore has not been changing his dressing. He admits to inconsistent where of compression stockings, admits to pain to his bilateral lower extremities with unilateral edema (right greater than left). The ulcerations to multiple toes are more macerated and larger in size than last week. He is wearing compression therapy today. He admits to an overall feeling of weakness and fatigue and has been encouraged to contact his PCP for evaluation; he has a known history of anemia and intraabdominal lymphadenopathy. We will return to silvercel and he will follow up next week 09/19/17-He is here in follow-up evaluation for ulcerations to bilateral first and second toe.he was unable to make last week's appointment. The culture that was obtained on 5/30 grew clindamycin sensitive MRSA; he was initiated on 6/4 but did not start it until 6/7. There is significant improvement in all  wounds, healing to left second and third toe. He also admits to having no alcohol or smoking for the last 6 days. We will continue with same treatment plan I will extend the clindamycin for an additional 10 days and he will follow-up next week. 09/26/17- He is here in follow evaluation for multiple ulcerations to multiple toes bilaterally. He continues to be non-compliant with dressing, compression therapy. He states he is taking the antibiotics as prescribed, although his recall is inconsistent. There is noted deterioration in all ulcers. We will continue same treatment plan, antibiotic therapy and he will follow up next week. 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 This information is obtained from the medical record, as he cannot articulate specifics to his diagnosis or treatment plan. 10/17/17-He is here in follow-up evaluation for multiple ulcerations to multiple toes bilaterally. There is chronic waxing and waning improvement/deterioration. On today's exam they appear improved, dry without maceration. He continues to apply a variety of topical agents, stating that he has switched between the blue product and silver product. In my opinion, this will be a chronic waxing and waning giving his intermittent compliance. He will follow-up in 2 weeks. As it relates to the biopsy taken from his buttock, he has not followed up with that and has not started radiation therapy. 10/31/17-He is here for evaluation for multiple ulcerations to multiple toes bilaterally. Continues with waxing and waning improvement versus deterioration. They are stable on today's exam without significant maceration. 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 We will continue with every 2 week follow-up appointments 11/14/17- He  is seen in follow-up evaluation for multiple ulcerations to multiple bilateral toes. He is scheduled for excision of basal cell carcinoma to his right buttock on Monday 8/12. He continues to have chronic waxing and waning of his ulcers; today is a stable day with minimal drainage and left ear is better than right toes. I will recount to infectious disease regarding chronic suppressive therapy. He admits to being more compliant with compression stockings, although he is not wearing any today. He has been encouraged to continue with dressing changes as ordered and compression therapy daily. He will be seen in 2 weeks 11/28/17-He is seen in follow up evaluation for multiple ulcerations to multiple toes bilaterally. He had an overnight admission at [facility] (8/11-8/12) for symptomatic anemia, received two units prbc. His surgical date has been moved to 9/94 excision of basal cell carcinoma to  the right buttock. He has been noncompliant in wearing compression stockings, applying topical treatment as ordered; he has been applying a "cream", Hydrofera Blue, silvercel. He is currently on no antibiotic therapy; we will send for re-referral to ID for suppressive therapy given his multiple co-morbidities, poor surgical candidate, unwilling to have toe amputation and general non-adherence. 12/19/17 on evaluation today patient actually appears to be doing very well in regard to his toes at least compared to last time I saw him. He again has been coming to our office for quite a bit of time. 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 He states is hurting a little bit but doing well in general. I'm see were not taking care of this area dermatology is. He does have his appointment with infectious disease upcoming which they re-consult for suppressive therapy that's next Monday. 01/02/18 on evaluation  today patient actually appears to be doing rather well in regard to his toe ulcers although he tells me he has been put in an ointment on this which I am concerned may be causing some additional moisture buildup which we really do not want. He supposed be using silver cell which is mainly thing I want him to be utilizing. Fortunately there does not appear to be any evidence of worsening infection which is good news. He has had surgery on his gluteal region where he had an excision of a cancerous area fortunately that seems to be doing well although he is having some discomfort. 02/06/18 on evaluation today patient actually appears to be doing well at all locations except for his second toe use bilaterally [name], [name] ([id number]) where the distal tip is still wrong open. He did state that he can't stand for dry skin to buildup on his toes subsequently as a question and further it sounds as if he actually pills this all which is likely keeping the toe is wrong and open. Also think that's what happened in the bottom of his foot although that appears to have healed at this point. There does not appear to be any evidence of infection. 02/20/18 on evaluation today patient appears to be doing better in regard to his foot ulcers. Everything has shown signs of improvement which is excellent news. Overall I'm very pleased with how things appear currently. The patient states he's not picking her point at any skin and that he wears shoes at all times when he is ambulating at home. Both are things that I've advocated and recommended for him in the past. 03/13/18 on evaluation today patient actually appears to be doing very well in regard to the bilateral toes in the overall appearance of the wound bed locations. Fortunately there does not appear to be any signs of infection at this time. He states that he is very pleased with how things seem to be progressing to be honest as am I. 03/27/1899 evaluation today  patient appears to be doing well in regard to his toe ulcers. He just has a few areas remaining and these appear to be showing signs of improvement as well which is great news. Overall I have been impressed with the progress of the past several weeks. 04/10/18 on evaluation today patient appears to be doing a little bit worse in regard to the right plantar foot at the regional the first metatarsal as well as the left second toe. He tells me that  he ran out of bandages he also tells me he's been wearing his flip-flops a lot at home as well as a new pair of boots that he received as well. This is as opposed to the postop shoes that we had previously given him. Nonetheless overall I feel like that he has had a little bit more friction to the area which is causing things to look a little bit worse today. 04/24/18 on evaluation today patient's wounds for the most part appear to be doing excellent especially in regard to the bilateral second toes. With that being said he does have issues with the wound still on the first metatarsal region and first toe of the right foot which have not completely closed at this point. Nonetheless I do feel like that he is making progress which is good news. 05/08/18 on evaluation today patient's wounds appear to be doing just a little bit worse than normal simply due to the fact that he has not had dressing supplies. He apparently owed $35 in order for them to be shipped and unfortunately is not able to pay that currently. We did give him information for the [facility] today which will hopefully help cover his dressing supplies. He was thankful for this he states is gonna contact them this afternoon. Fortunately otherwise or does not hear any signs of infection. 05/22/18 on evaluation today patient appears to be doing somewhat poorly in regard to his toes. Upon further questioning it appears that he actually has been pulling skin off of the toes which "drive  him crazy". Subsequently as he does this he calls his new areas to tear open and I think this is one of the reasons why his toes are not healing as appropriately as we would like. He fortunately has no signs of infection at this time. With that being said I'm still concerned about the possibility of a fungal infection even though there doesn't appear to be anything bacterial. Potentially nystatin powder could be of benefit for him as far as trying to help this in that regard. 06/05/18 on evaluation today patient actually appears to be doing very well in regard to his foot ulcers. Fortunately there's no signs of infection. He's been tolerating the dressing changes without complication. I'm very pleased with how things seem to be progressing. Fortunately there's no evidence of active infection at this point I do believe nystatin powder has been of benefit for him. Electronic Signature(s) Signed: 06/05/2018 5:10:06 PM By: Worthy Keeler PA-C Entered By: Worthy Keeler on 06/05/2018 11:00:09 Sargeant, [name] ([id number]) -------------------------------------------------------------------------------- Physical Exam Details Patient Name: Rampersad, Leilan E. Date of Service: 06/05/2018 8:30 AM Medical Record Number: [id number] Patient Account Number: [id number] Date of Birth/Sex: October 18, 1950 (68 y.o. M) Treating RN: Army Melia Primary Care Provider: Lamonte Sakai Other Clinician: Referring Provider: Lamonte Sakai Treating Provider/Extender: Melburn Hake, HOYT Weeks in Treatment: 70 Constitutional Well-nourished and well-hydrated in no acute distress. Respiratory normal breathing without difficulty. clear to auscultation bilaterally. Cardiovascular regular rate and rhythm with normal S1, S2. Psychiatric this patient is able to make decisions and demonstrates good insight into disease process. Alert and Oriented x 3. pleasant and cooperative. Notes On evaluation today patient's wounds did not require any  sharp debridement appear to be doing excellent at this point. In fact he appears to be very close to complete resolution which is great news. I'm very pleased with this. Electronic Signature(s) Signed: 06/05/2018 5:10:06 PM By: Worthy Keeler PA-C Entered By: Joaquim Lai  III, Margarita Grizzle on 06/05/2018 11:00:56 Borland, YASSEN KINNETT ([id number]) -------------------------------------------------------------------------------- Physician Orders Details Patient Name: Och, Baker E. Date of Service: 06/05/2018 8:30 AM Medical Record Number: [id number] Patient Account Number: [id number] Date of Birth/Sex: Dec 06, 1950 (68 y.o. M) Treating RN: Army Melia Primary Care Provider: Lamonte Sakai Other Clinician: Referring Provider: Lamonte Sakai Treating Provider/Extender: Melburn Hake, HOYT Weeks in Treatment: 85 Verbal / Phone Orders: No Diagnosis Coding ICD-10 Coding Code Description L97.521 Non-pressure chronic ulcer of other part of left foot limited to breakdown of skin L97.511 Non-pressure chronic ulcer of other part of right foot limited to breakdown of skin E11.621 Type 2 diabetes mellitus with foot ulcer I87.323 Chronic venous hypertension (idiopathic) with inflammation of bilateral lower extremity I89.0 Lymphedema, not elsewhere classified F17.218 Nicotine dependence, cigarettes, 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 abuse 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, right ankle and foot Wound Cleansing Wound #16 Right,Plantar Toe Great o Clean wound with Normal Saline. Wound #17 Right,Distal,Plantar Metatarsal head second o Clean wound with Normal Saline. Wound #2 Right Toe Second o Clean wound with Normal Saline. Anesthetic (add to Medication List) Wound #16 Right,Plantar Toe Great o Topical Lidocaine 4% cream applied to wound bed prior to debridement (In Clinic Only). Wound #17 Right,Distal,Plantar Metatarsal head second o Topical  Lidocaine 4% cream applied to wound bed prior to debridement (In Clinic Only). Wound #2 Right Toe Second o Topical Lidocaine 4% cream applied to wound bed prior to debridement (In Clinic Only). Skin Barriers/Peri-Wound Care Wound #16 Right,Plantar Toe Great o Antifungal powder-Nystatin Wound #17 Right,Distal,Plantar Metatarsal head second o Antifungal powder-Nystatin Wound #2 Right Toe Second o Antifungal powder-Nystatin Crisman, Taurus E. ([id number]) Primary Wound Dressing Wound #16 Right,Plantar Toe Great o Silver Alginate - secure with tape Wound #17 Right,Distal,Plantar Metatarsal head second o Silver Alginate - secure with tape Wound #2 Right Toe Second o Silver Alginate - secure with tape Dressing Change Frequency Wound #16 Right,Plantar Toe Great o Change dressing every day. Wound #17 Right,Distal,Plantar Metatarsal head second o Change dressing every day. Wound #2 Right Toe Second o Change dressing every day. Follow-up Appointments Wound #16 Right,Plantar Toe Great o Return Appointment in 2 weeks. Wound #17 Right,Distal,Plantar Metatarsal head second o Return Appointment in 2 weeks. Wound #2 Right Toe Second o Return Appointment in 2 weeks. Edema Control Wound #16 Right,Plantar Toe Great o Patient to wear own compression stockings Wound #17 Right,Distal,Plantar Metatarsal head second o Patient to wear own compression stockings Wound #2 Right Toe Second o Patient to wear own compression stockings Additional Orders / Instructions Wound #16 Right,Plantar Toe Great o Stop Smoking o Increase protein intake. Wound #17 Right,Distal,Plantar Metatarsal head second o Stop Smoking o Increase protein intake. Wound #2 Right Toe Second o Stop Smoking o Increase protein intake. Medications-please add to medication list. Wound #16 [address] [place] E. ([id number]) o Other: - Nystatin Powder Wound #17  Right,Distal,Plantar Metatarsal head second o Other: - Nystatin Powder Wound #2 Right Toe Second o Other: - Nystatin Powder Electronic Signature(s) Signed: 06/05/2018 12:46:38 PM By: Army Melia Signed: 06/05/2018 5:10:06 PM By: Worthy Keeler PA-C Entered By: Army Melia on 06/05/2018 09:02:32 Vantil, [name] ([id number]) -------------------------------------------------------------------------------- Problem List Details Patient Name: Romanoff, Kymere E. Date of Service: 06/05/2018 8:30 AM Medical Record Number: [id number] Patient Account Number: [id number] Date of Birth/Sex: 1950-05-27 (68 y.o. M) Treating RN: Army Melia Primary Care Provider: Lamonte Sakai Other Clinician: Referring Provider: Lamonte Sakai Treating Provider/Extender: Melburn Hake, HOYT Weeks in Treatment: 21  Active Problems ICD-10 Evaluated Encounter Code Description Active Date Today Diagnosis L97.521 Non-pressure chronic ulcer of other part of left foot limited to 09/21/2016 No Yes breakdown of skin L97.511 Non-pressure chronic ulcer of other part of right foot limited to 09/21/2016 No Yes breakdown of skin E11.621 Type 2 diabetes mellitus with foot ulcer 09/21/2016 No Yes I87.323 Chronic venous hypertension (idiopathic) with inflammation of 09/21/2016 No Yes bilateral lower extremity I89.0 Lymphedema, not elsewhere classified 09/21/2016 No Yes F17.218 Nicotine dependence, cigarettes, with other nicotine-induced 09/21/2016 No Yes disorders F10.19 Alcohol abuse with unspecified alcohol-induced disorder 09/21/2016 No Yes M86.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, right ankle and foot 02/21/2017 No Yes Inactive Problems Resolved Problems Tamm, TIBOR LEMMONS ([id number]) Electronic Signature(s) Signed: 06/05/2018 5:10:06 PM By: Worthy Keeler PA-C Entered By: Worthy Keeler on 06/05/2018 08:57:29 [place], [name] ([id number]) -------------------------------------------------------------------------------- Progress Note  Details Patient Name: Flippen, Lamel E. Date of Service: 06/05/2018 8:30 AM Medical Record Number: [id number] Patient Account Number: [id number] Date of Birth/Sex: 1950-06-25 (68 y.o. M) Treating RN: Army Melia Primary Care Provider: Lamonte Sakai Other Clinician: Referring Provider: Lamonte Sakai Treating Provider/Extender: Melburn Hake, HOYT Weeks in Treatment: 61 Subjective Chief Complaint Information obtained from Patient Patient presents for treatment of an open diabetic ulcer to both feet History of Present Illness (HPI) 68 year old patient here to see as for bilateral feet ulceration to on his left first and second toe and 2 on his right first and second toe, which she's had for about 4 months. 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 Donella Stade. The patient is also undergoing workup by medical oncology for a intra-abdominal lymphadenopathy. 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 He is also status post appendectomy, inguinal lymph node biopsy, rectal biopsy and rectal examination under anesthesia. he currently smokes cigarettes about half packet a day. In March of this year he was seen by Dr. Hortencia Pilar, for evaluation of bilateral varicose veins and besides wearing compression stockings he had recommended laser ablation of the right and left great saphenous veins to eleviate the symptoms and complications of severe superficial venous reflux disease. He also recommended lymphedema pumps for better control of his lymphedema. The patient recently has had on 08/23/2016, right greater saphenous vein ablation with the laser energy Earlier lower extremity venous reflux examination done on 05/08/2016 showed no DVT or SVT both lower legs  but incompetence of bilateral great saphenous veins was present. A lower arterial study was also done and there was no significant right lower and left lower extremity problems based on a normal toe brachial index bilaterally and the ABI was 1.21 the left and 1.23 on the right. His post ablation venous duplex examination showed successful ablation of the right GS vein with thrombus formation 2 below the right saphenofemoral junction. The deep system was patent without evidence of thrombosis and this was done on 08/30/2016. 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 Addendum: regarding his x-rays done today and x-ray of the left foot -- IMPRESSION: No objective evidence of osteomyelitis. There are soft tissue changes which may reflect cellulitis. X-ray of the right foot -- IMPRESSION:Findings compatible with cellulitis of the toes. No objective evidence  of osteomyelitis is observed. 10/01/16 on evaluation today patient's wounds appeared to be doing some better. I did review the x-rays as well which showed no evidence of osteomyelitis although there was evidence on x-ray of cellulitis. He fortunately is not having any discomfort although he continues to have some swelling. He does not remember being on any antibiotics recently. 10/15/16 on evaluation today patient's wounds overall appear to be doing better although he does have a new location noted on the left foot. Fortunately he is not having significant pain. It almost has the appearance that something is rubbing on the end of his toes but he wears the open toe shoes and according to what he is telling me never wears anything that would rub on his foot. There is no evidence of infection and specifically no evidence of a fungal infection 10/22/16 On evaluation today patient's wounds appeared to be doing better compared to last week in regard  to his bilateral lower extremities. Fortunately I happy with how things are progressing although he still has ulcers I feel like that he is improving and appropriate manner. SWAYZE, PRIES ([id number]) 11/12/16 on evaluation today patient appears to be doing well in regard to his bilateral feet and the respective wounds. We have been using surrounding her dressings along with an antifungal cream which seems to be doing very well. He has no bilateral dysfunction noticed that the rituals are weight loss at this point. He also has no nausea or vomiting a note purulent discharge. He did see Vein and vascular today and he tells me that they told him he could have surgery for his venous stasis but they did not feel like it was worth it in his words. Fortunately patient's wounds do appear to be getting sneakily better. 11/26/2016 -- he says he is going to have some surgery during this week at [facility] for possibly a colon resection. 12/31/2016 -- the patient has been noncompliant with his smoking and I'm not sure whether he is also started drinking again. He continues to be very nonchalant about his care 01/14/2017 -- the patient's HandP has been reviewed well and I understand he is being compliant with trying to give up smoking and his local dressing changes. He does not have any surgical options of 4 to him by his vascular surgeons.he was last seen in early August by Dr. Hortencia Pilar who recommended compression stockings,and possibly lymph pumps in 2-3 months after doing a review ultrasound. 01/28/2017 - the patient did not have any fresh complaints but on examination I noted a large lacerated wound on the plantar aspect of his right fourth toe which had a lot of necrotic debris and it probes down to bone. 02/07/2017 -- x-ray of the right foot -- IMPRESSION: Soft tissue swelling about the first through fourth toes consistent with cellulitis. New destructive change in the tuft of the distal  phalanx of the great toe is consistent with osteomyelitis. 02/14/2017 -- the patient's MRI is pending this coming Monday and he still continues to smoke. We have again gone over off loading of his wounds in great detail and he says he's been compliant. 02/21/2017 -- MR of the right foot -- IMPRESSION: 1. Soft tissue ulcer at the tip of the first, second and third toe knows. Cortical irregularity and bone marrow edema in the first distal phalanx most concerning for osteomyelitis. Mild marrow edema in the second and third distal phalanx without definite cortical destruction which may reflect early osteomyelitis versus reactive  marrow edema. 2. Soft tissue edema surrounding the first phalanx most consistent with cellulitis. the patient was also recently evaluated by his medical oncologist Dr. 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 She is treating him with weekly Procrit. She is also keeping intra-abdominal lymphadenopathy and right lower lobe lung nodule under observation. 04/04/2017 -- he was seen by Dr. Adrian Prows on 03/25/2017 -- after review he empirically put him on ciprofloxacin and doxycycline as they have good bone penetration and good bioavailability and it will cover the usual pathogens and diabetic foot osteomyelitis. He will check inflammatory markers and plan a 56-29 week old records. C-reactive protein was 0.3 and the ESR was 72 04/18/17 on evaluation today patient appears to be doing about the same in regard to his lower extremity wounds bilaterally. He has continued to use the antifungal cream which does seem to be beneficial. Nonetheless the ulcers do seem to in some areas be epithelial eyes over and in other areas are still open. He is having no significant discomfort. 04/25/17-he is here in follow-up evaluation for multiple ulcerations to multiple toes bilaterally. He states he did see Dr. Ola Spurr again  last week and continues antibiotic therapy. He is voicing no complaints or concerns, will continue with current treatment plan will possibility of adding compression therapy next week after an additional week of treatment/lotions to BLE prescribed by Dr Ola Spurr 05/02/17 he is here in follow up for for multiple ulcers to multiple toes bilaterally. we will stop using antifungal cream and will continue with silvercel and follow up next week 05/09/17-he is here in follow-up for multiple ulcerations to multiple toes bilaterally. There is improvement in appearance. He has not completely stopped using antifungal cream, but admits he has not using it between the toes. He has an appointment with Dr. Ola Spurr on 2/11, continues on doxycycline and Cipro. It has been 5 weeks of antibiotic therapy, we will order plain film xray to evaluate for osteomyelitis next week, prior to follow up with ID. Will continue with silvercel and follow up next week 05/16/17-he is here in follow-up evaluation for multiple ulcerations to multiple toes bilaterally and new wound to the right posterior heel. There is essentially no change in appearance, deteriorating measurements; he has a history of waxing and waning measurements. He admits that he continues to apply moisturizer/cream/ointment to his toes despite weekly reminders to only apply silvercel to his toes. He states that he thinks the surgical shoe contributed to the superficial ulcer to his posterior heel, he is unable to articulate if this was an area of dry cracked skin as he has a similar area to the left heel. He now is wearing open toed slippers. He has an appointment with Dr. Ola Spurr on 2/11. We have ordered x-rays for her bilateral feet; [name], [name] ([id number]) he was advised to obtain the x-rays today or tomorrow. He will follow-up next week 05/23/17-he is here in follow-up evaluation for multiple ulcerations to multiple toes bilaterally and the right  posterior heel. There is improvement in maceration. He has been compliant and not applying any moisturizing agent to his toes. He has been using Lac-Hydrin for his lower extremities with improvement. He did not go to his appointment on Monday with Dr. Ola Spurr secondary to financial concerns. X-rays for her bilateral feet showed: LEFT FOOT with slight erosion of the tuft of the distal phalanges of the left first and second toe suspicious for osteomyelitis,  RIGHT FOOT with 1.erosion of the tufts of the distal phalanges of the right first second and possibly third toes consistent with osteomyelitis, 2 no definitive abnormality of the calcaneus is seen on the images obtained, 3. Plantar calcaneal degenerative spur. We briefly discussed hyperbaric adjunctive therapy for treatment of chronic refractory osteomyelitis. I do not find an a1c in EMR, will contact PCP for record, or order if needed. He has been encouraged to contact Dr Ola Spurr office regarding the follow-up appointment, encouraged him to inquire about payment plan. We will continue with same treatment plan and follow-up next week. He states he is still taking antibiotics and has "a lot" left. He states he has been taking them as directed, 2 pills twice daily. According to Dr. Blane Ohara office notes he was originally started on 12/17 for 4 weeks and extended on 1/14 for an additional 4 weeks. He should be done with his antibiotic therapy, he was advised to bring his bottles and to his next appointment, we will contact pharmacy. 05/30/17-he is here in follow-up evaluation for multiple ulcerations to multiple toes bilaterally and the right posterior heel. He is accompanied by his brother-in-law. Wounds are stable. He has yet to make up with Dr. Ola Spurr. We contacted his PCP, with no record of recent A1c we will draw an A1c. His brother-in-law states that he was taken off all of his diabetic medication secondary to kidney function. He is  currently seen he walk for CKD anemia, receiving weekly Procrit shots.his brother-in-law brought in his antibiotics and pill organizer. The antibiotics were counted and have approximately 2 weeks left, although they should be complete. The pill organizer reveals missing days. We discussed the need for consistent medications, to have optimal benefit of medication. He has a cousin that lives with him and he will ask her to check his organizer daily. He has been advised to follow up with Dr Ola Spurr, and will go by the office today. He has been advised to quit smoking. 06/06/17-he is here in follow up evaluation. He has had to make an appointment with Dr. Ola Spurr. He did have blood work obtained, a1c 5. He continues to take antibiotic therapy. Significant improvement in bilateral lower extremity edema with compression therapy. Essentially no change in ulcerations to toes. He states he is "going to try something different" and "let me know next week" if it works; he would not provide any additional information and was encouraged to follow our orders. We will follow up next week 06/13/17-he is here in follow-up evaluation. He has an appointment with Dr. Ola Spurr tomorrow morning. He states he purchased an ointment from [facility] and applied to his toes for 3 days, he does not remember the name of the ointment. There is improvement to his wounds, minimal maceration. He continues to take antibiotic therapy, this should have been completed last month. His brother-in-law who regularly accompanies his appointments was asked to take the bottles to the appointment tomorrow with Dr. Ola Spurr so he is aware. We will continue with 3 layer compression, and order OPEN TOE compression 20-30mmHg; we will apply compression stockings next week. He continues to smoke, smoked "2 cigarettes" last week 06/20/17 on evaluation today patient did receive his compression stockings which he has with him today for both  lower extremities. With that being said he tells me at this point in time that he is very happy to have these he really is not a big fan of the compression wraps that we have been utilizing although they have  been of great benefit for him. Nonetheless at this point he does want to switch to the compression stockings. In my opinion as long as he is continuing with compression I'm okay with the stockings or the wraps. 06/27/17-he is here in follow-up evaluation for multiple ulcerations to his bilateral toes. There is some improvement in appearance. He is compliant in wearing his compression stockings with significant improvement in lower extremity edema. He saw Dr Ola Spurr on 3/8, per his notes they would redraw ESR and CRP; plan to continue antibiotic therapy if these remain elevated. I do not see an ESR or CRP level in Epic. The patient continues to take antibiotics. 07/11/17-He is here in follow-up evaluation for multiple ulcerations to multiple toes bilaterally. He presents with complete epithelialization to the right third toe; there has been no deterioration. He continues on antibiotic therapy. He will follow-up next week 07/18/17-He is here in follow-up evaluation for multiple ulcerations to multiple toes bilaterally. He continues to make improvement. He continues on antibiotic therapy. He states he has been using something additional to our orders, he does not elaborate but states he will bring it in next week. 07/25/17-He is here in follow up evaluation for multiple ulcerations to bilateral toes. He is stable. He has completed antibiotic therapy. He admits to "filing" his toes after showers each evening, this is what he was referencing last week; he does not filing for the wounds. We will switch to [facility] and monitor for any improvement, he will follow-up next week 08/01/17-He is here in follow-up evaluation. He admits to "picking" at his toes after cleansing yesterday, leading to new  areas of tissue loss on the bilateral second toe. There is improvement noted to the bilateral great toe. We will dress toes today and hope that they maintain until Monday where he will come in for a nurse visit. He has been advised, multiple times with expressed verbalization, to change the dressings to silvercel if the dressings get wet prior to [facility] appointment. 08/08/17-He is here in follow-up evaluation for bilateral first and second toe ulcerations. There is significant improvement to all ulcerations since last visit. We will switch to [facility] to all wounds and he will continue with nurse visits on a Baughman, Arbor E. ([id number]) Monday/Thursday schedule and follow-up with me in 2 weeks. He continues to smoke, 1-3 cigarettes per day, and has been encouraged to not smoke until his next follow-up in 2 weeks. 08/15/17 on evaluation today patient's ulcers on his toes actually appear to be doing fairly well the right to ulcers may be a little bit more moist compared to the left I'm not really sure exactly why as the openings appear to be very small and I'm not seeing any evidence of anything significant as far as you lightest or otherwise. Nonetheless this may just be a small setback he's been doing very well with the [facility] [employer] Dressing 08/22/17-He is here in follow-up evaluation for ulcerations to his bilateral first and second toes, there is small/scant amount of drainage noted on today's dressing. He continues with Hydrofera Blue. He continues to smoke, 1 cigarette a day. Voices no complaint or concerns, compliant and compression therapy today. He'll follow-up next week 08/29/17-He is here in follow-up evaluation for ulcerations to his bilateral first and second toes, with a new wound to the left third toe. He states he cut himself while cutting the toenail. He presents today with more maceration and increased measurements; waxing and waning measurements and moisture has been  an ongoing  issue. He continues to smoke a proximally 1 cigarette per day, but states he has not smoked since Sunday. We will initiate medihoney daily and evaluate next week. He presents today without compression stockings 09/05/17-He is here in follow-up evaluation for ulcerations to bilateral first and second and left third toe. He did not pick up the medihoney and therefore has not been changing his dressing. He admits to inconsistent where of compression stockings, admits to pain to his bilateral lower extremities with unilateral edema (right greater than left). The ulcerations to multiple toes are more macerated and larger in size than last week. He is wearing compression therapy today. He admits to an overall feeling of weakness and fatigue and has been encouraged to contact his PCP for evaluation; he has a known history of anemia and intraabdominal lymphadenopathy. We will return to silvercel and he will follow up next week 09/19/17-He is here in follow-up evaluation for ulcerations to bilateral first and second toe.he was unable to make last week's appointment. The culture that was obtained on 5/30 grew clindamycin sensitive MRSA; he was initiated on 6/4 but did not start it until 6/7. There is significant improvement in all wounds, healing to left second and third toe. He also admits to having no alcohol or smoking for the last 6 days. We will continue with same treatment plan I will extend the clindamycin for an additional 10 days and he will follow-up next week. 09/26/17- He is here in follow evaluation for multiple ulcerations to multiple toes bilaterally. He continues to be non-compliant with dressing, compression therapy. He states he is taking the antibiotics as prescribed, although his recall is inconsistent. There is noted deterioration in all ulcers. We will continue same treatment plan, antibiotic therapy and he will follow up next week. 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 This information is obtained from the medical record, as he cannot articulate specifics to his diagnosis or treatment plan. 10/17/17-He is here in follow-up evaluation for multiple ulcerations to multiple toes bilaterally. There is chronic waxing and waning improvement/deterioration. On today's exam they appear improved, dry without maceration. He continues to apply a variety of topical agents, stating that he has switched between the blue product and silver product. In my opinion, this will be a chronic waxing and waning giving his intermittent compliance. He will follow-up in 2 weeks. As it relates to the biopsy taken from his buttock, he has not followed up with that and has not started radiation therapy. 10/31/17-He is here for evaluation for multiple ulcerations to multiple toes bilaterally. Continues with waxing and waning improvement versus deterioration. They are stable on today's exam without significant maceration. 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 We will continue with every 2 week follow-up appointments 11/14/17- He is seen in follow-up evaluation for multiple ulcerations to multiple bilateral toes. He is scheduled for excision of basal cell carcinoma to his right buttock on Monday 8/12. He continues to have chronic waxing and waning of his ulcers; today is a stable day with minimal drainage and left ear is better than right toes. I will recount to infectious disease regarding chronic suppressive therapy. He admits to being more compliant with compression stockings, although he is not wearing any today. He has been encouraged to continue with  dressing changes as ordered and compression therapy daily. He will be seen in 2 weeks 11/28/17-He is seen in follow up evaluation for multiple  ulcerations to multiple toes bilaterally. He had an overnight admission at [facility] (8/11-8/12) for symptomatic anemia, received two units prbc. His surgical date has been moved to 9/94 excision of basal cell carcinoma to the right buttock. He has been noncompliant in wearing compression stockings, applying topical treatment as ordered; he has been applying a "cream", Hydrofera Blue, silvercel. He is currently on no antibiotic therapy; we will send for re-referral to ID for suppressive therapy given his multiple co-morbidities, poor surgical candidate, unwilling to have toe amputation and general non-adherence. 12/19/17 on evaluation today patient actually appears to be doing very well in regard to his toes at least compared to last time I saw him. He again has been coming to our office for quite a bit of time. 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 He states is hurting a little bit but doing well in general. I'm see were not taking care of this area dermatology is. He does have his appointment with infectious disease upcoming which they re-consult for suppressive therapy that's next Monday. 01/02/18 on evaluation today patient actually appears to be doing rather well in regard to his toe ulcers although he tells me he [name], [name] ([id number]) has been put in an ointment on this which I am concerned may be causing some additional moisture buildup which we really do not want. He supposed be using silver cell which is mainly thing I want him to be utilizing. Fortunately there does not appear to be any evidence of worsening infection which is good news. He has had surgery on his gluteal region where he had an excision of a cancerous area fortunately that seems to be doing well although he is having some discomfort. 02/06/18 on evaluation today patient actually appears to be doing  well at all locations except for his second toe use bilaterally where the distal tip is still wrong open. He did state that he can't stand for dry skin to buildup on his toes subsequently as a question and further it sounds as if he actually pills this all which is likely keeping the toe is wrong and open. Also think that's what happened in the bottom of his foot although that appears to have healed at this point. There does not appear to be any evidence of infection. 02/20/18 on evaluation today patient appears to be doing better in regard to his foot ulcers. Everything has shown signs of improvement which is excellent news. Overall I'm very pleased with how things appear currently. The patient states he's not picking her point at any skin and that he wears shoes at all times when he is ambulating at home. Both are things that I've advocated and recommended for him in the past. 03/13/18 on evaluation today patient actually appears to be doing very well in regard to the bilateral toes in the overall appearance of the wound bed locations. Fortunately there does not appear to be any signs of infection at this time. He states that he is very pleased with how things seem to be progressing to be honest as am I. 03/27/1899 evaluation today patient appears to be doing well in regard to his toe ulcers. He just has a few areas remaining and these appear to be showing  signs of improvement as well which is great news. Overall I have been impressed with the progress of the past several weeks. 04/10/18 on evaluation today patient appears to be doing a little bit worse in regard to the right plantar foot at the regional the first metatarsal as well as the left second toe. He tells me that he ran out of bandages he also tells me he's been wearing his flip-flops a lot at home as well as a new pair of boots that he received as well. This is as opposed to the postop shoes that we had previously given him. Nonetheless  overall I feel like that he has had a little bit more friction to the area which is causing things to look a little bit worse today. 04/24/18 on evaluation today patient's wounds for the most part appear to be doing excellent especially in regard to the bilateral second toes. With that being said he does have issues with the wound still on the first metatarsal region and first toe of the right foot which have not completely closed at this point. Nonetheless I do feel like that he is making progress which is good news. 05/08/18 on evaluation today patient's wounds appear to be doing just a little bit worse than normal simply due to the fact that he has not had dressing supplies. He apparently owed $35 in order for them to be shipped and unfortunately is not able to pay that currently. We did give him information for the [place] today which will hopefully help cover his dressing supplies. He was thankful for this he states is gonna contact them this afternoon. Fortunately otherwise or does not hear any signs of infection. 05/22/18 on evaluation today patient appears to be doing somewhat poorly in regard to his toes. Upon further questioning it appears that he actually has been pulling skin off of the toes which "drive him crazy". Subsequently as he does this he calls his new areas to tear open and I think this is one of the reasons why his toes are not healing as appropriately as we would like. He fortunately has no signs of infection at this time. With that being said I'm still concerned about the possibility of a fungal infection even though there doesn't appear to be anything bacterial. Potentially nystatin powder could be of benefit for him as far as trying to help this in that regard. 06/05/18 on evaluation today patient actually appears to be doing very well in regard to his foot ulcers. Fortunately there's no signs of infection. He's been tolerating the dressing changes without  complication. I'm very pleased with how things seem to be progressing. Fortunately there's no evidence of active infection at this point I do believe nystatin powder has been of benefit for him. Patient History Information obtained from Patient. Social History Current every day smoker, Marital Status - Widowed, Alcohol Use - Daily - quit drinking about a week ago, Drug Use - No History, Caffeine Use - Moderate. AJAY, STRUBEL ([id number]) Medical History Eyes Denies history of Cataracts, Glaucoma, Optic Neuritis Ear/Nose/Mouth/Throat Denies history of Chronic sinus problems/congestion, Middle ear problems Hematologic/Lymphatic Patient has history of Anemia, Lymphedema Denies history of Hemophilia, Human Immunodeficiency Virus, Sickle Cell Disease Respiratory Denies history of Aspiration, Asthma, Chronic Obstructive Pulmonary Disease (COPD), Pneumothorax, Sleep Apnea, Tuberculosis Cardiovascular Patient has history of Congestive Heart Failure, Hypertension, Peripheral Venous Disease Denies history of Angina, Arrhythmia, Coronary Artery Disease, Deep Vein Thrombosis, Hypotension, Myocardial Infarction, Peripheral Arterial Disease,  Phlebitis, Vasculitis Gastrointestinal Denies history of Cirrhosis , Colitis, Crohn s, Hepatitis A, Hepatitis B, Hepatitis C Endocrine Patient has history of Type II Diabetes Genitourinary Denies history of End Stage Renal Disease Immunological Denies history of Lupus Erythematosus, Raynaud s, Scleroderma Integumentary (Skin) Denies history of History of Burn, History of pressure wounds Musculoskeletal Denies history of Gout, Rheumatoid Arthritis, Osteoarthritis, Osteomyelitis Neurologic Patient has history of Neuropathy Denies history of Dementia, Quadriplegia, Paraplegia, Seizure Disorder Medical And Surgical History Notes 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 Respiratory The patient has no complaints or symptoms. Cardiovascular The patient has no complaints or symptoms. Psychiatric The patient has no complaints or symptoms. Objective Constitutional Well-nourished and well-hydrated in no acute distress. [name], [name] ([id number]) Vitals Time Taken: 8:38 AM, Height: 69 in, Weight: 168 lbs, BMI: 24.8, Temperature: 98.1 F, Pulse: 68 bpm, Respiratory Rate: 16 breaths/min, Blood Pressure: 129/88 mmHg. Respiratory normal breathing without difficulty. clear to auscultation bilaterally. Cardiovascular regular rate and rhythm with normal S1, S2. Psychiatric this patient is able to make decisions and demonstrates good insight into disease process. Alert and Oriented x 3. pleasant and cooperative. General Notes: On evaluation today patient's wounds did not require any sharp debridement appear to be doing excellent at this point. In fact he appears to be very close to complete resolution which is great news. I'm very pleased with this. Integumentary (Hair, Skin) Wound #16 status is Open. Original cause of wound was Not Known. The wound is located on the [employer]. The wound measures 0.8cm length x 0.8cm width x 0.1cm depth; 0.503cm^2 area and 0.05cm^3 volume. There is Fat Layer (Subcutaneous Tissue) Exposed exposed. There is no tunneling or undermining noted. There is a medium amount of serous drainage noted. The wound margin is flat and intact. There is large (67-100%) pink granulation within the wound bed. There is a small (1-33%) amount of necrotic tissue within the wound bed including Adherent Slough. The periwound skin appearance exhibited: Maceration. The periwound skin appearance did not exhibit: Callus, Crepitus, Excoriation, Induration, Rash, Scarring, Dry/Scaly, Atrophie Blanche, Cyanosis, Ecchymosis, Hemosiderin Staining, Mottled, Pallor, Rubor,  Erythema. Periwound temperature was noted as No Abnormality. Wound #17 status is Open. Original cause of wound was Trauma. The wound is located on the Right,Distal,Plantar Metatarsal head second. The wound measures 0.9cm length x 0.5cm width x 0.2cm depth; 0.353cm^2 area and 0.071cm^3 volume. There is Fat Layer (Subcutaneous Tissue) Exposed exposed. There is no tunneling or undermining noted. There is a medium amount of serous drainage noted. The wound margin is flat and intact. There is large (67-100%) pink granulation within the wound bed. There is a small (1-33%) amount of necrotic tissue within the wound bed including Adherent Slough. The periwound skin appearance exhibited: Maceration. The periwound skin appearance did not exhibit: Callus, Crepitus, Excoriation, Induration, Rash, Scarring, Dry/Scaly, Atrophie Blanche, Cyanosis, Ecchymosis, Hemosiderin Staining, Mottled, Pallor, Rubor, Erythema. Periwound temperature was noted as No Abnormality. Wound #18 status is Healed - Epithelialized. Original cause of wound was Gradually Appeared. The wound is located on the Right,[employer] The wound measures 0cm length x 0cm width x 0cm depth; 0cm^2 area and 0cm^3 volume. Wound #2 status is Open. Original cause of wound was Gradually Appeared. The wound is located on the Right Toe Second. The wound measures 0.1cm length x 0.1cm width x 0.1cm depth; 0.008cm^2 area and 0.001cm^3 volume. The wound  is limited to skin breakdown. There is no tunneling or undermining noted. There is a medium amount of serous drainage noted. The wound margin is indistinct and nonvisible. There is large (67-100%) pink granulation within the wound bed. There is no necrotic tissue within the wound bed. The periwound skin appearance did not exhibit: Callus, Crepitus, Excoriation, Induration, Rash, Scarring, Dry/Scaly, Maceration, Atrophie Blanche, Cyanosis, Ecchymosis, Hemosiderin Staining, Mottled, Pallor, Rubor, Erythema.  Periwound temperature was noted as No Abnormality. Wound #6 status is Healed - Epithelialized. Original cause of wound was Gradually Appeared. The wound is located on the Left Toe Second. The wound measures 0cm length x 0cm width x 0cm depth; 0cm^2 area and 0cm^3 volume. Assessment Active Problems ICD-10 [name], [name] 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 Wound #17 Right,Distal,Plantar Metatarsal head second: Clean wound with Normal Saline. Wound #2 Right Toe Second: Clean wound with Normal Saline. Anesthetic (add to Medication List): Wound #16 Right,Plantar Toe Great: Topical Lidocaine 4% cream applied to wound bed prior to debridement (In Clinic Only). Wound #17 Right,Distal,Plantar Metatarsal head second: Topical Lidocaine 4% cream applied to wound bed prior to debridement (In Clinic Only). Wound #2 Right Toe Second: Topical Lidocaine 4% cream applied to wound bed prior to debridement (In Clinic Only). Skin Barriers/Peri-Wound Care: Wound #16 Right,Plantar Toe Great: Antifungal powder-Nystatin Wound #17 Right,Distal,Plantar Metatarsal head second: Antifungal powder-Nystatin Wound #2 Right Toe Second: Antifungal powder-Nystatin Primary Wound Dressing: Wound #16 Right,Plantar Toe Great: Silver Alginate - secure with tape Wound #17 Right,Distal,Plantar Metatarsal head second: Silver Alginate - secure with  tape Wound #2 Right Toe Second: Silver Alginate - secure with tape Dressing Change Frequency: Wound #16 Right,Plantar Toe Great: Change dressing every day. Wound #17 Right,Distal,Plantar Metatarsal head second: Change dressing every day. Wound #2 Right Toe Second: Change dressing every day. Follow-up Appointments: Wound #16 Right,Plantar Toe Great: Return Appointment in 2 weeks. Wound #17 Right,Distal,Plantar Metatarsal head second: Return Appointment in 2 weeks. Wound #2 Right Toe Second: Return Appointment in 2 weeks. Edema Control: [name], [name] ([id number]) Wound #16 Right,Plantar Toe Great: Patient to wear own compression stockings Wound #17 Right,Distal,Plantar Metatarsal head second: Patient to wear own compression stockings Wound #2 Right Toe Second: Patient to wear own compression stockings Additional Orders / Instructions: Wound #16 Right,Plantar Toe Great: Stop Smoking Increase protein intake. Wound #17 Right,Distal,Plantar Metatarsal head second: Stop Smoking Increase protein intake. Wound #2 Right Toe Second: Stop Smoking Increase protein intake. Medications-please add to medication list.: Wound #16 Right,Plantar Toe Great: Other: - Nystatin Powder Wound #17 Right,Distal,Plantar Metatarsal head second: Other: - Nystatin Powder Wound #2 Right Toe Second: Other: - Nystatin Powder I'm in a recommend that we continue with the above wound care measures for the next week and the patient is in agreement with plan. I will subsequently see him back for reevaluation in two weeks time to see were things stand. If anything changes or worsens he will let me know. Please see above for specific wound care orders. We will see patient for re-evaluation in 2 week(s) here in the  clinic. If anything worsens or changes patient will contact our office for additional recommendations. Electronic Signature(s) Signed: 06/05/2018 5:10:06 PM By: Worthy Keeler PA-C Entered By:  Worthy Keeler on 06/05/2018 11:01:19 Blackie, [name] ([id number]) -------------------------------------------------------------------------------- ROS/PFSH Details Patient Name: Peschke, Juandaniel E. Date of Service: 06/05/2018 8:30 AM Medical Record Number: [id number] Patient Account Number: [id number] Date of Birth/Sex: 1950-12-04 (67 y.o. M) Treating RN: Army Melia Primary Care Provider: Lamonte Sakai Other Clinician: Referring Provider: Lamonte Sakai Treating Provider/Extender: Melburn Hake, HOYT Weeks in Treatment: 73 Information Obtained From Patient Wound History Do you currently have one or more open woundso Yes How many open wounds do you currently haveo 6 Approximately how long have you had your woundso 3 months How have you been treating your wound(s) until nowo ointment and bandage Has your wound(s) ever healed and then re-openedo No Have you had any lab work done in the past montho No Have you tested positive for an antibiotic resistant organism (MRSA, VRE)o No Have you tested positive for osteomyelitis (bone infection)o No Have you had any tests for circulation on your legso Yes Who ordered the testo PCP Where was the test doneo [facility] Constitutional Symptoms (General Health) Complaints and Symptoms: Negative for: Fatigue; Fever; Chills Eyes Medical History: Negative for: Cataracts; Glaucoma; Optic Neuritis Ear/Nose/Mouth/Throat Medical History: Negative for: Chronic sinus problems/congestion; Middle ear problems Hematologic/Lymphatic Medical History: Positive for: Anemia; Lymphedema Negative for: Hemophilia; Human Immunodeficiency Virus; Sickle Cell Disease Respiratory Complaints and Symptoms: No Complaints or Symptoms Medical History: Negative for: Aspiration; Asthma; Chronic Obstructive Pulmonary Disease (COPD); Pneumothorax; Sleep Apnea; Tuberculosis Cardiovascular Complaints and Symptoms: No Complaints or Symptoms [name], Donnis E. ([id number]) Medical  History: Positive for: Congestive Heart Failure; Hypertension; Peripheral Venous Disease Negative for: Angina; Arrhythmia; Coronary Artery Disease; Deep Vein Thrombosis; Hypotension; Myocardial Infarction; Peripheral Arterial Disease; Phlebitis; Vasculitis Gastrointestinal Medical History: Negative for: Cirrhosis ; Colitis; Crohnos; Hepatitis A; Hepatitis B; Hepatitis C Endocrine Medical History: Positive for: Type II Diabetes Treated with: Oral agents Blood sugar tested every day: Yes Tested : QD Genitourinary Medical History: Negative for: End Stage Renal Disease Immunological Medical History: Negative for: Lupus Erythematosus; Raynaudos; Scleroderma Integumentary (Skin) Medical History: Negative for: History of Burn; History of pressure wounds Musculoskeletal Medical History: Negative for: Gout; Rheumatoid Arthritis; Osteoarthritis; Osteomyelitis Neurologic Medical History: Positive for: Neuropathy Negative for: Dementia; Quadriplegia; Paraplegia; Seizure Disorder Oncologic Medical History: Past Medical History Notes: 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 ([id number]) Immunization Notes: up to date Implantable Devices No devices added Family and Social History Current every day smoker; Marital Status - Widowed; Alcohol Use: Daily - quit drinking about a week ago; Drug Use: No History; Caffeine Use: Moderate; Financial Concerns: No; Food, Clothing or Shelter Needs: No; Support System Lacking: No; Transportation Concerns: No; Advanced Directives: No; Patient does not want information on Advanced Directives Physician Affirmation I have reviewed and agree with the above information. Electronic Signature(s) Signed: 06/05/2018 12:46:38 PM By: Army Melia Signed: 06/05/2018 5:10:06 PM By: Worthy Keeler PA-C Entered By: Worthy Keeler on 06/05/2018 11:00:25 Gatchalian, [name] ([id number]) -------------------------------------------------------------------------------- SuperBill Details Patient Name: Mcconnon, Solomon E. Date of Service: 06/05/2018 Medical Record Number: [id number] Patient Account Number: [id number] Date of Birth/Sex: [birth year] (68 y.o. 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 E11.621 Type 2 diabetes mellitus with foot ulcer I87.323 Chronic venous hypertension (idiopathic) with inflammation of bilateral lower extremity I89.0 Lymphedema, not elsewhere classified F17.218 Nicotine dependence, cigarettes, 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 abuse 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, right ankle and foot Facility Procedures CPT4 Code: 69629528 Description: 41324 - WOUND CARE VISIT-LEV 5 EST PT Modifier: Quantity: 1 Physician Procedures CPT4 Code Description: [id number] [id number] - WC PHYS LEVEL 4 - EST PT ICD-10 Diagnosis Description L97.521 Non-pressure chronic ulcer of other part of left foot limited to L97.511 Non-pressure chronic ulcer of other part of right foot limited t E11.621 Type 2  diabetes mellitus with foot ulcer I87.323 Chronic venous hypertension (idiopathic) with inflammation of bi Modifier: breakdown of s o breakdown of lateral lower e Quantity: 1 kin skin xtremity Electronic Signature(s) Signed: 06/05/2018 5:10:06 PM By: Worthy Keeler PA-C Previous Signature: 06/05/2018 10:06:11 AM Version By: Gretta Cool, BSN, RN, CWS, Kim RN,  BSN Entered By: Worthy Keeler on 06/05/2018 11:01:37

## 2018-06-08 NOTE — Progress Notes (Signed)
MELL, GUIA ([id number]) Visit Report for 06/05/2018 Arrival Information Details Patient Name: [name], [name] Date of Service: 06/05/2018 8:30 AM Medical Record Number: [id number] Patient Account Number: [id number] Date of Birth/Sex: August 03, 1950 (68 y.o. M) Treating RN: Army Melia Primary Care [name]: [name] Other Clinician: Referring [name]: [name] Treating Marijose Curington/Extender: [name], [name]: 42 Visit Information History Since Last Visit Added or deleted any medications: No Patient Arrived: Ambulatory Any new allergies or adverse reactions: No Arrival Time: 08:37 Had a fall or experienced change in No Accompanied By: brother in activities of daily living that may affect law risk of falls: Transfer Assistance: None Signs or symptoms of abuse/neglect since last visito No Patient Identification Verified: Yes Hospitalized since last visit: No Secondary Verification Process Completed: Yes Implantable device outside of the clinic excluding No Patient Requires Transmission-Based No cellular tissue based products placed in the center Precautions: since last visit: Patient Has Alerts: Yes Has Dressing in Place as Prescribed: No Patient Alerts: DMII Pain Present Now: No Electronic Signature(s) Signed: 06/05/2018 4:19:08 PM By: Lorine Bears RCP, RRT, CHT Entered By: Lorine Bears on 06/05/2018 08:38:36 [name] ([id number]) -------------------------------------------------------------------------------- Clinic Level of Care Assessment Details Patient Name: Mahnken, Trung E. Date of Service: 06/05/2018 8:30 AM Medical Record Number: [id number] Patient Account Number: [id number] Date of Birth/Sex: February 08, 1951 (68 y.o. M) Treating RN: Army Melia Primary Care Lennox Leikam: [name] Other Clinician: Referring Aseel Truxillo: [name] Treating Hakan Nudelman/Extender: [name], [name]: 28 Clinic Level of  Care Assessment Items TOOL 4 Quantity Score []  - Use when only an EandM is performed on FOLLOW-UP visit 0 ASSESSMENTS - Nursing Assessment / Reassessment []  - Reassessment of Co-morbidities (includes updates in patient status) 0 X- 1 5 Reassessment of Adherence to [name] Plan ASSESSMENTS - Wound and Skin Assessment / Reassessment []  - Simple Wound Assessment / Reassessment - one wound 0 X- 5 5 Complex Wound Assessment / Reassessment - multiple wounds []  - 0 Dermatologic / Skin Assessment (not related to wound area) ASSESSMENTS - Focused Assessment []  - Circumferential Edema Measurements - multi extremities 0 []  - 0 Nutritional Assessment / Counseling / Intervention []  - 0 Lower Extremity Assessment (monofilament, tuning fork, pulses) []  - 0 Peripheral Arterial Disease Assessment (using hand held doppler) ASSESSMENTS - Ostomy and/or Continence Assessment and Care []  - Incontinence Assessment and Management 0 []  - 0 Ostomy Care Assessment and Management (repouching, etc.) PROCESS - Coordination of Care X - Simple Patient / Family Education for ongoing care 1 15 []  - 0 Complex (extensive) Patient / Family Education for ongoing care []  - 0 Staff obtains Programmer, systems, Records, Test Results / Process Orders []  - 0 Staff telephones HHA, Nursing Homes / Clarify orders / etc []  - 0 Routine Transfer to another Facility (non-emergent condition) []  - 0 Routine Hospital Admission (non-emergent condition) []  - 0 New Admissions / Biomedical engineer / Ordering NPWT, Apligraf, etc. []  - 0 Emergency Hospital Admission (emergent condition) X- 1 10 Simple Discharge Coordination Sybert, [name] ([id number]) []  - 0 Complex (extensive) Discharge Coordination PROCESS - Special Needs []  - Pediatric / Minor Patient Management 0 []  - 0 Isolation Patient Management []  - 0 Hearing / Language / Visual special needs []  - 0 Assessment of Community assistance (transportation, D/C planning,  etc.) []  - 0 Additional assistance / Altered mentation []  - 0 Support Surface(s) Assessment (bed, cushion, seat, etc.) INTERVENTIONS - Wound Cleansing / Measurement []  - Simple Wound Cleansing - one  wound 0 X- 5 5 Complex Wound Cleansing - multiple wounds X- 1 5 Wound Imaging (photographs - any number of wounds) []  - 0 Wound Tracing (instead of photographs) []  - 0 Simple Wound Measurement - one wound []  - 0 Complex Wound Measurement - multiple wounds INTERVENTIONS - Wound Dressings []  - Small Wound Dressing one or multiple wounds 0 X- 5 15 Medium Wound Dressing one or multiple wounds []  - 0 Large Wound Dressing one or multiple wounds []  - 0 Application of Medications - topical []  - 0 Application of Medications - injection INTERVENTIONS - Miscellaneous []  - External ear exam 0 []  - 0 Specimen Collection (cultures, biopsies, blood, body fluids, etc.) []  - 0 Specimen(s) / Culture(s) sent or taken to Lab for analysis []  - 0 Patient Transfer (multiple staff / Civil Service fast streamer / Similar devices) []  - 0 Simple Staple / Suture removal (25 or less) []  - 0 Complex Staple / Suture removal (26 or more) []  - 0 Hypo / Hyperglycemic Management (close monitor of Blood Glucose) []  - 0 Ankle / Brachial Index (ABI) - do not check if billed separately X- 1 5 Vital Signs Cangemi, [name] ([id number]) Has the patient been seen at the hospital within the last three years: Yes Total Score: 165 Level Of Care: New/Established - Level 5 Electronic Signature(s) Signed: 06/05/2018 12:46:38 PM By: Army Melia Entered By: Army Melia on 06/05/2018 11:16:17 [name] ([id number]) -------------------------------------------------------------------------------- Encounter Discharge Information Details Patient Name: Ohagan, Nicolo E. Date of Service: 06/05/2018 8:30 AM Medical Record Number: [id number] Patient Account Number: [id number] Date of Birth/Sex: [birth year] (68 y.o. M) Treating RN: Harold Barban Primary Care Lorenda Grecco: [name] Other Clinician: Referring Mamie Hundertmark: [name] Treating Maurice Fotheringham/Extender: [name], [name]: 77 Encounter Discharge Information Items Discharge Condition: Stable Ambulatory Status: Ambulatory Discharge Destination: Home Transportation: Private Auto Accompanied By: friend Schedule Follow-up Appointment: Yes Clinical Summary of Care: Electronic Signature(s) Signed: 06/06/2018 5:19:20 PM By: Harold Barban Entered By: Harold Barban on 06/05/2018 09:12:23 Firebaugh, [name] ([id number]) -------------------------------------------------------------------------------- Lower Extremity Assessment Details Patient Name: Hunzeker, [name] Date of Service: 06/05/2018 8:30 AM Medical Record Number: [id number] Patient Account Number: [id number] Date of Birth/Sex: 31-Mar-1951 (68 y.o. M) Treating RN: Montey Hora Primary Care Mieczyslaw Stamas: [name] Other Clinician: Referring [name] Whiters: [name] Treating Nekeya Briski/Extender: [name], [name]: 54 Vascular Assessment Pulses: Dorsalis Pedis Palpable: [Left:Yes] [Right:Yes] Posterior Tibial Extremity colors, hair growth, and conditions: Extremity Color: [Left:Hyperpigmented] [Right:Hyperpigmented] Hair Growth on Extremity: [Left:No] [Right:No] Temperature of Extremity: [Left:Warm] [Right:Warm] Capillary Refill: [Left:< 3 seconds] [Right:< 3 seconds] Electronic Signature(s) Signed: 06/05/2018 4:28:00 PM By: Montey Hora Entered By: Montey Hora on 06/05/2018 08:49:52 Panik, [name] ([id number]) -------------------------------------------------------------------------------- Multi Wound Chart Details Patient Name: Janus, Alphons E. Date of Service: 06/05/2018 8:30 AM Medical Record Number: [id number] Patient Account Number: [id number] Date of Birth/Sex: February 16, 1951 (68 y.o. M) Treating RN: Army Melia Primary Care Esmee Fallaw: [name] Other Clinician: Referring  Deyra Perdomo: [name] Treating Meiling Hendriks/Extender: [name], [name]: 18 Vital Signs Height(in): 55 Pulse(bpm): 77 Weight(lbs): 168 Blood Pressure(mmHg): 129/88 Body Mass Index(BMI): 25 Temperature(F): 98.1 Respiratory Rate 16 (breaths/min): Photos: [16:No Photos] [17:No Photos] [18:No Photos] Wound Location: [16:Right Toe Great - Plantar] [17:Right Metatarsal head second - Plantar, Distal] [18:Right, Dorsal Toe Great] Wounding Event: [16:Not Known] [17:Trauma] [18:Gradually Appeared] 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] [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] [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] Comorbid History: [16:Anemia, Lymphedema, Congestive Heart Failure, Hypertension,  Peripheral Venous Disease, Type II Diabetes, Neuropathy] [17:Anemia, Lymphedema, Congestive Heart Failure, Hypertension, Peripheral Venous Disease, Type II Diabetes,  Neuropathy] [18:N/A] Date Acquired: [16:10/29/2017] [17:02/03/2018] [18:05/22/2018] Weeks of [name]: [16:31] [17:17] [18:2] Wound Status: [16:Open] [17:Open] [18:Healed - Epithelialized] Pending Amputation on [16:No] [17:No] [18:No] Presentation: Measurements L x W x D [16:0.8x0.8x0.1] [17:0.9x0.5x0.2] [18:0x0x0] (cm) Area (cm) : [26:8.341] [17:0.353] [18:0] Volume (cm) : [16:0.05] [17:0.071] [18:0] % Reduction in Area: [16:68.60%] [17:0.00%] [18:100.00%] % Reduction in Volume: [16:68.80%] [17:-102.90%] [18:100.00%] Classification: [16:Grade 1] [17:Grade 1] [18:Grade 1] Exudate Amount: [16:Medium] [17:Medium] [18:N/A] Exudate Type: [16:Serous] [17:Serous] [18:N/A] Exudate Color: [16:amber] [17:amber] [18:N/A] Wound Margin: [16:Flat and Intact] [17:Flat and Intact] [18:N/A] Granulation Amount: [16:Large (67-100%)] [17:Large (67-100%)] [18:N/A] Granulation Quality: [16:Pink] [17:Pink] [18:N/A] Necrotic Amount: [16:Small (1-33%)] [17:Small (1-33%)] [18:N/A] Exposed  Structures: [16:Fat Layer (Subcutaneous Tissue) Exposed: Yes Fascia: No Tendon: No Muscle: No Joint: No Bone: No] [17:Fat Layer (Subcutaneous Tissue) Exposed: Yes Fascia: No Tendon: No Muscle: No Joint: No Bone: No] [18:N/A] Epithelialization: Small (1-33%) Medium (34-66%) N/A Periwound Skin Texture: Excoriation: No Excoriation: No No Abnormalities Noted Induration: No Induration: No Callus: No Callus: No Crepitus: No Crepitus: No Rash: No Rash: No Scarring: No Scarring: No Periwound Skin Moisture: Maceration: Yes Maceration: Yes No Abnormalities Noted Dry/Scaly: No Dry/Scaly: No Periwound Skin Color: Atrophie Blanche: No Atrophie Blanche: No No Abnormalities Noted Cyanosis: No Cyanosis: No Ecchymosis: No Ecchymosis: No Erythema: No Erythema: No Hemosiderin Staining: No Hemosiderin Staining: No Mottled: No Mottled: No Pallor: No Pallor: No Rubor: No Rubor: No Temperature: No Abnormality No Abnormality N/A Tenderness on Palpation: No No No Wound Preparation: Ulcer Cleansing: Ulcer Cleansing: N/A Rinsed/Irrigated with Saline Rinsed/Irrigated with Saline Topical Anesthetic Applied: Topical Anesthetic Applied: Other: lidocaine 4% Other: lidocaine 4% Wound Number: 2 6 N/A Photos: No Photos No Photos N/A Wound Location: Right Toe Second Left Toe Second N/A Wounding Event: Gradually Appeared Gradually Appeared N/A Primary Etiology: Diabetic Wound/Ulcer of the Diabetic Wound/Ulcer of the N/A Lower Extremity Lower Extremity Comorbid History: Anemia, Lymphedema, N/A N/A Congestive Heart Failure, Hypertension, Peripheral Venous Disease, Type II Diabetes, Neuropathy Date Acquired: 06/11/2016 06/11/2016 N/A Weeks of [name]: 88 88 N/A Wound Status: Open Healed - Epithelialized N/A Pending Amputation on Yes Yes N/A Presentation: Measurements L x W x D 0.1x0.1x0.1 0x0x0 N/A (cm) Area (cm) : 0.008 0 N/A Volume (cm) : 0.001 0 N/A % Reduction in Area: 99.70% 100.00%  N/A % Reduction in Volume: 99.70% 100.00% N/A Classification: Grade 2 Grade 2 N/A Exudate Amount: Medium N/A N/A Exudate Type: Serous N/A N/A Exudate Color: amber N/A N/A Wound Margin: Indistinct, nonvisible N/A N/A Granulation Amount: Large (67-100%) N/A N/A Granulation Quality: Pink N/A N/A Necrotic Amount: None Present (0%) N/A N/A Exposed Structures: Fascia: No N/A N/A Fat Layer (Subcutaneous Tissue) Exposed: No Shook, Aniceto E. ([id number]) Tendon: No Muscle: No Joint: No Bone: No Limited to Skin Breakdown Epithelialization: Large (67-100%) N/A N/A Periwound Skin Texture: Excoriation: No No Abnormalities Noted N/A Induration: No Callus: No Crepitus: No Rash: No Scarring: No Periwound Skin Moisture: Maceration: No No Abnormalities Noted N/A Dry/Scaly: No Periwound Skin Color: Atrophie Blanche: No No Abnormalities Noted N/A Cyanosis: No Ecchymosis: No Erythema: No Hemosiderin Staining: No Mottled: No Pallor: No Rubor: No Temperature: No Abnormality N/A N/A Tenderness on Palpation: No No N/A Wound Preparation: Ulcer Cleansing: N/A N/A Rinsed/Irrigated with Saline Topical Anesthetic Applied: None [name] Notes Electronic Signature(s) Signed: 06/05/2018 12:46:38 PM By: Army Melia Entered By: Army Melia on 06/05/2018 09:00:01 Manninen, [name] ([id number]) -------------------------------------------------------------------------------- Multi-Disciplinary  Care Plan Details Patient Name: Lanni, [name] Date of Service: 06/05/2018 8:30 AM Medical Record Number: [id number] Patient Account Number: [id number] Date of Birth/Sex: 1950/06/17 (68 y.o. M) Treating RN: Army Melia Primary Care Ahan Eisenberger: [name] Other Clinician: Referring Kason Benak: [name] Treating Shakaya Bhullar/Extender: [name], [name]: 46 Active Inactive Abuse / Safety / Falls / Self Care Management Nursing Diagnoses: Potential for falls Goals: Patient will remain injury  free related to falls Date Initiated: 09/21/2016 Target Resolution Date: 09/14/2017 Goal Status: Active Interventions: Assess fall risk on admission and as needed Notes: Nutrition Nursing Diagnoses: Potential for alteratiion in Nutrition/Potential for imbalanced nutrition Goals: Patient/caregiver agrees to and verbalizes understanding of need to use nutritional supplements and/or vitamins as prescribed Date Initiated: 09/21/2016 Target Resolution Date: 09/14/2017 Goal Status: Active Interventions: Assess patient nutrition upon admission and as needed per policy Notes: Orientation to the Wound Care Program Nursing Diagnoses: Knowledge deficit related to the wound healing center program Goals: Patient/caregiver will verbalize understanding of the [facility] Date Initiated: 09/21/2016 Target Resolution Date: 06/15/2017 Goal Status: Active Interventions: Provide education on orientation to the wound center Blevens, Nestor E. ([id number]) Notes: Wound/Skin Impairment Nursing Diagnoses: Knowledge deficit related to smoking impact on wound healing Goals: Ulcer/skin breakdown will have a volume reduction of 30% by week 4 Date Initiated: 09/21/2016 Target Resolution Date: 08/17/2017 Goal Status: Active Ulcer/skin breakdown will have a volume reduction of 50% by week 8 Date Initiated: 09/21/2016 Target Resolution Date: 08/17/2017 Goal Status: Active Ulcer/skin breakdown will have a volume reduction of 80% by week 12 Date Initiated: 09/21/2016 Target Resolution Date: 09/14/2017 Goal Status: Active Ulcer/skin breakdown will heal within 14 weeks Date Initiated: 09/21/2016 Target Resolution Date: 08/17/2017 Goal Status: Active Interventions: Assess patient/caregiver ability to obtain necessary supplies Assess patient/caregiver ability to perform ulcer/skin care regimen upon admission and as needed Assess ulceration(s) every visit Notes: Electronic Signature(s) Signed: 06/05/2018  12:46:38 PM By: Army Melia Entered By: Army Melia on 06/05/2018 08:59:53 Guardino, [name] ([id number]) -------------------------------------------------------------------------------- Pain Assessment Details Patient Name: Pickert, [name] Date of Service: 06/05/2018 8:30 AM Medical Record Number: [id number] Patient Account Number: [id number] Date of Birth/Sex: [birth year] (68 y.o. M) Treating RN: Army Melia Primary Care Jamilette Suchocki: [name] Other Clinician: Referring Bing Duffey: [name] Treating Luvena Wentling/Extender: [name], [name]: 42 Active Problems Location of Pain Severity and Description of Pain Patient Has Paino No Site Locations Pain Management and Medication Current Pain Management: Electronic Signature(s) Signed: 06/05/2018 12:46:38 PM By: Army Melia Signed: 06/05/2018 4:19:08 PM By: Lorine Bears RCP, RRT, CHT Entered By: Lorine Bears on 06/05/2018 08:38:45 Settle, [name] ([id number]) -------------------------------------------------------------------------------- Patient/Caregiver Education Details Patient Name: Randa, Jahni E. Date of Service: 06/05/2018 8:30 AM Medical Record Number: [id number] Patient Account Number: [id number] Date of Birth/Gender: 05-21-1950 (68 y.o. M) Treating RN: Army Melia Primary Care Physician: [name] Other Clinician: Referring Physician: Lamonte [name] Treating Physician/Extender: Sharalyn Ink in [name]: 78 Education Assessment Education Provided To: Patient Education Topics Provided Smoking and Wound Healing: Handouts: Smoking and Wound Healing Methods: Demonstration, Explain/Verbal Responses: State content correctly Wound/Skin Impairment: Handouts: Caring for Your Ulcer Methods: Demonstration, Explain/Verbal Responses: State content correctly Electronic Signature(s) Signed: 06/06/2018 5:19:20 PM By: Harold Barban Entered By: Harold Barban on 06/05/2018  09:12:27 Davilla, [name] ([id number]) -------------------------------------------------------------------------------- Wound Assessment Details Patient Name: Soper, Zeplin E. Date of Service: 06/05/2018 8:30 AM Medical Record Number: [id number] Patient Account Number: [id number] Date of Birth/Sex: July 31, 1950 (68 y.o. M) Treating RN:  Montey Hora Primary Care Zandria Woldt: [name] Other Clinician: Referring Gerod Caligiuri: [name] Treating Mazzy Santarelli/Extender: [name], [name]: 50 Wound Status Wound Number: 16 Primary Diabetic Wound/Ulcer of the Lower Extremity Etiology: Wound Location: Right Toe Great - Plantar Wound Open Wounding Event: Not Known Status: Date Acquired: 10/29/2017 Comorbid Anemia, Lymphedema, Congestive Heart Weeks Of [name]: 31 History: Failure, Hypertension, Peripheral Venous Clustered Wound: No Disease, Type II Diabetes, Neuropathy Photos Photo Uploaded By: Montey Hora on 06/05/2018 16:26:31 Wound Measurements Length: (cm) 0.8 Width: (cm) 0.8 Depth: (cm) 0.1 Area: (cm) 0.503 Volume: (cm) 0.05 % Reduction in Area: 68.6% % Reduction in Volume: 68.8% Epithelialization: Small (1-33%) Tunneling: No Undermining: No Wound Description Classification: Grade 1 Wound Margin: Flat and Intact Exudate Amount: Medium Exudate Type: Serous Exudate Color: amber Foul Odor After Cleansing: No Slough/Fibrino Yes Wound Bed Granulation Amount: Large (67-100%) Exposed Structure Granulation Quality: Pink Fascia Exposed: No Necrotic Amount: Small (1-33%) Fat Layer (Subcutaneous Tissue) Exposed: Yes Necrotic Quality: Adherent Slough Tendon Exposed: No Muscle Exposed: No Joint Exposed: No Bone Exposed: No Periwound Skin Texture Marcil, [name] ([id number]) Texture Color No Abnormalities Noted: No No Abnormalities Noted: No Callus: No Atrophie Blanche: No Crepitus: No Cyanosis: No Excoriation: No Ecchymosis: No Induration: No Erythema:  No Rash: No Hemosiderin Staining: No Scarring: No Mottled: No Pallor: No Moisture Rubor: No No Abnormalities Noted: No Dry / Scaly: No Temperature / Pain Maceration: Yes Temperature: No Abnormality Wound Preparation Ulcer Cleansing: Rinsed/Irrigated with Saline Topical Anesthetic Applied: Other: lidocaine 4%, [name] Notes Wound #16 (Right, Plantar Toe Great) Notes nystatin powder, scell, gauze, tape Electronic Signature(s) Signed: 06/05/2018 4:28:00 PM By: Montey Hora Entered By: Montey Hora on 06/05/2018 08:47:42 Galluzzo, [name] ([id number]) -------------------------------------------------------------------------------- Wound Assessment Details Patient Name: Fortin, Hyatt E. Date of Service: 06/05/2018 8:30 AM Medical Record Number: [id number] Patient Account Number: [id number] Date of Birth/Sex: June 08, 1950 (68 y.o. M) Treating RN: Montey Hora Primary Care Kieron Kantner: [name] Other Clinician: Referring Elva Breaker: [name] Treating Farhad Burleson/Extender: [name], [name]: 77 Wound Status Wound Number: 17 Primary Diabetic Wound/Ulcer of the Lower Extremity Etiology: Wound Location: Right Metatarsal head second - Plantar, Distal Wound Open Status: Wounding Event: Trauma Comorbid Anemia, Lymphedema, Congestive Heart Date Acquired: 02/03/2018 History: Failure, Hypertension, Peripheral Venous Weeks Of [name]: 17 Disease, Type II Diabetes, Neuropathy Clustered Wound: No Photos Photo Uploaded By: Montey Hora on 06/05/2018 16:27:07 Wound Measurements Length: (cm) 0.9 Width: (cm) 0.5 Depth: (cm) 0.2 Area: (cm) 0.353 Volume: (cm) 0.071 % Reduction in Area: 0% % Reduction in Volume: -102.9% Epithelialization: Medium (34-66%) Tunneling: No Undermining: No Wound Description Classification: Grade 1 Wound Margin: Flat and Intact Exudate Amount: Medium Exudate Type: Serous Exudate Color: amber Foul Odor After Cleansing:  No Slough/Fibrino Yes Wound Bed Granulation Amount: Large (67-100%) Exposed Structure Granulation Quality: Pink Fascia Exposed: No Necrotic Amount: Small (1-33%) Fat Layer (Subcutaneous Tissue) Exposed: Yes Necrotic Quality: Adherent Slough Tendon Exposed: No Muscle Exposed: No Joint Exposed: No Bone Exposed: No Periwound Skin Texture Rajagopalan, Erminio E. ([id number]) Texture Color No Abnormalities Noted: No No Abnormalities Noted: No Callus: No Atrophie Blanche: No Crepitus: No Cyanosis: No Excoriation: No Ecchymosis: No Induration: No Erythema: No Rash: No Hemosiderin Staining: No Scarring: No Mottled: No Pallor: No Moisture Rubor: No No Abnormalities Noted: No Dry / Scaly: No Temperature / Pain Maceration: Yes Temperature: No Abnormality Wound Preparation Ulcer Cleansing: Rinsed/Irrigated with Saline Topical Anesthetic Applied: Other: lidocaine 4%, [name] Notes Wound #17 (Right, Distal, Plantar Metatarsal head second) Notes  nystatin powder, scell, gauze, tape Electronic Signature(s) Signed: 06/05/2018 4:28:00 PM By: Montey Hora Entered By: Montey Hora on 06/05/2018 08:47:56 Melberg, [name] ([id number]) -------------------------------------------------------------------------------- Wound Assessment Details Patient Name: Coppens, Adain E. Date of Service: 06/05/2018 8:30 AM Medical Record Number: [id number] Patient Account Number: [id number] Date of Birth/Sex: 01-04-1951 (68 y.o. M) Treating RN: Montey Hora Primary Care Kala Gassmann: [name] Other Clinician: Referring Seila Liston: [name] Treating Tashiana Lamarca/Extender: [name], [name]: 6 Wound Status Wound Number: 18 Primary Diabetic Wound/Ulcer of the Lower Etiology: Extremity Wound Location: Right, Dorsal Toe Great Wound Status: Healed - Epithelialized Wounding Event: Gradually Appeared Date Acquired: 05/22/2018 Weeks Of [name]: 2 Clustered Wound: No Photos Photo Uploaded  By: Montey Hora on 06/05/2018 16:27:08 Wound Measurements Length: (cm) 0 % Width: (cm) 0 % Depth: (cm) 0 Area: (cm) 0 Volume: (cm) 0 Reduction in Area: 100% Reduction in Volume: 100% Wound Description Classification: Grade 1 Periwound Skin Texture Texture Color No Abnormalities Noted: No No Abnormalities Noted: No Moisture No Abnormalities Noted: No Electronic Signature(s) Signed: 06/05/2018 4:28:00 PM By: Montey Hora Entered By: Montey Hora on 06/05/2018 08:48:40 Valentino, Nehan E. ([id number]) -------------------------------------------------------------------------------- Wound Assessment Details Patient Name: Eisler, [name] Date of Service: 06/05/2018 8:30 AM Medical Record Number: [id number] Patient Account Number: [id number] Date of Birth/Sex: 1951/02/03 (68 y.o. M) Treating RN: Montey Hora Primary Care Neeka Urista: [name] Other Clinician: Referring Yomara Toothman: [name] Treating Tamiya Colello/Extender: [name], [name]: 64 Wound Status Wound Number: 2 Primary Diabetic Wound/Ulcer of the Lower Extremity Etiology: Wound Location: Right Toe Second Wound Open Wounding Event: Gradually Appeared Status: Date Acquired: 06/11/2016 Comorbid Anemia, Lymphedema, Congestive Heart Weeks Of [name]: 89 History: Failure, Hypertension, Peripheral Venous Clustered Wound: No Disease, Type II Diabetes, Neuropathy Pending Amputation On Presentation Photos Photo Uploaded By: Montey Hora on 06/05/2018 16:27:35 Wound Measurements Length: (cm) 0.1 Width: (cm) 0.1 Depth: (cm) 0.1 Area: (cm) 0.008 Volume: (cm) 0.001 % Reduction in Area: 99.7% % Reduction in Volume: 99.7% Epithelialization: Large (67-100%) Tunneling: No Undermining: No Wound Description Classification: Grade 2 Wound Margin: Indistinct, nonvisible Exudate Amount: Medium Exudate Type: Serous Exudate Color: amber Foul Odor After Cleansing: No Slough/Fibrino No Wound  Bed Granulation Amount: Large (67-100%) Exposed Structure Granulation Quality: Pink Fascia Exposed: No Necrotic Amount: None Present (0%) Fat Layer (Subcutaneous Tissue) Exposed: No Tendon Exposed: No Muscle Exposed: No Joint Exposed: No Bone Exposed: No Limited to Skin Breakdown Hartland, Kohner E. ([id number]) Periwound Skin Texture Texture Color No Abnormalities Noted: No No Abnormalities Noted: No Callus: No Atrophie Blanche: No Crepitus: No Cyanosis: No Excoriation: No Ecchymosis: No Induration: No Erythema: No Rash: No Hemosiderin Staining: No Scarring: No Mottled: No Pallor: No Moisture Rubor: No No Abnormalities Noted: No Dry / Scaly: No Temperature / Pain Maceration: No Temperature: No Abnormality Wound Preparation Ulcer Cleansing: Rinsed/Irrigated with Saline Topical Anesthetic Applied: None [name] Notes Wound #2 (Right Toe Second) Notes nystatin powder, scell, gauze, tape Electronic Signature(s) Signed: 06/05/2018 4:28:00 PM By: Montey Hora Entered By: Montey Hora on 06/05/2018 08:49:32 Klostermann, Bryer E. ([id number]) -------------------------------------------------------------------------------- Wound Assessment Details Patient Name: Brimage, [name] Date of Service: 06/05/2018 8:30 AM Medical Record Number: [id number] Patient Account Number: [id number] Date of Birth/Sex: 10-Feb-1951 (68 y.o. M) Treating RN: Montey Hora Primary Care Habeeb Puertas: [name] Other Clinician: Referring Derra Shartzer: [name] Treating Dot Splinter/Extender: [name], [name]: 25 Wound Status Wound Number: 6 Primary Diabetic Wound/Ulcer of the Lower Etiology: Extremity Wound Location: Left Toe Second Wound Status: Healed - Epithelialized  Wounding Event: Gradually Appeared Date Acquired: 06/11/2016 Weeks Of [name]: 88 Clustered Wound: No Pending Amputation On Presentation Photos Photo Uploaded By: Montey Hora on 06/05/2018 16:27:36 Wound  Measurements Length: (cm) 0 % Width: (cm) 0 % Depth: (cm) 0 Area: (cm) 0 Volume: (cm) 0 Reduction in Area: 100% Reduction in Volume: 100% Wound Description Classification: Grade 2 Periwound Skin Texture Texture Color No Abnormalities Noted: No No Abnormalities Noted: No Moisture No Abnormalities Noted: No Electronic Signature(s) Signed: 06/05/2018 4:28:00 PM By: Montey Hora Entered By: Montey Hora on 06/05/2018 08:49:16 Blum, [name] ([id number]) -------------------------------------------------------------------------------- Vitals Details Patient Name: Ursin, Lorik E. Date of Service: 06/05/2018 8:30 AM Medical Record Number: [id number] Patient Account Number: [id number] Date of Birth/Sex: 03/22/1951 (68 y.o. M) Treating RN: Army Melia Primary Care Rheana Casebolt: [name] Other Clinician: Referring Jerry Clyne: [name] Treating Kaylani Fromme/Extender: [name], [name]: 8 Vital Signs Time Taken: 08:38 Temperature (F): 98.1 Height (in): 69 Pulse (bpm): 68 Weight (lbs): 168 Respiratory Rate (breaths/min): 16 Body Mass Index (BMI): 24.8 Blood Pressure (mmHg): 129/88 Reference Range: 80 - 120 mg / dl Electronic Signature(s) Signed: 06/05/2018 4:19:08 PM By: Lorine Bears RCP, RRT, CHT Entered By: Lorine Bears on 06/05/2018 08:40:53

## 2018-06-16 NOTE — Progress Notes (Signed)
DAIL, LEREW ([id number]) Visit Report for 05/08/2018 Chief Complaint Document Details Patient Name: [name], [name] Date of Service: 05/08/2018 8:30 AM Medical Record Number: [id number] Patient Account Number: [id number] Date of Birth/Sex: [birth year] (68 y.o. M) Treating RN: Harold Barban Primary Care Provider: Lamonte Sakai Other Clinician: Referring Provider: Lamonte Sakai Treating Provider/Extender: Melburn Hake, HOYT Weeks in Treatment: 86 Information Obtained from: Patient Chief Complaint Patient presents for treatment of an open diabetic ulcer to both feet Electronic Signature(s) Signed: 05/08/2018 9:52:35 PM By: Worthy Keeler PA-C Entered By: Worthy Keeler on 05/08/2018 09:12:22 [name], [name] ([id number]) -------------------------------------------------------------------------------- HPI Details Patient Name: [name], [name] Date of Service: 05/08/2018 8:30 AM Medical Record Number: [id number] Patient Account Number: [id number] Date of Birth/Sex: Oct 24, 1950 (68 y.o. M) Treating RN: Harold Barban Primary Care Provider: Lamonte Sakai Other Clinician: Referring Provider: Lamonte Sakai Treating Provider/Extender: Melburn Hake, HOYT Weeks in Treatment: 71 History of Present Illness HPI Description: 68 year old patient here to see as for bilateral feet ulceration to on his left first and second toe and 2 on his right first and second toe, which she's had for about 4 months. 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 Donella Stade. The patient is also undergoing workup by medical oncology for a intra-abdominal lymphadenopathy. 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 He is also status post appendectomy, inguinal lymph node biopsy, rectal biopsy  and rectal examination under anesthesia. he currently smokes cigarettes about half packet a day. In March of this year he was seen by Dr. Hortencia Pilar, for evaluation of bilateral varicose veins and besides wearing compression stockings he had recommended laser ablation of the right and left great saphenous veins to eleviate the symptoms and complications of severe superficial venous reflux disease. He also recommended lymphedema pumps for better control of his lymphedema. The patient recently has had on 08/23/2016, right greater saphenous vein ablation with the laser energy Earlier lower extremity venous reflux examination done on 05/08/2016 showed no DVT or SVT both lower legs but incompetence of bilateral great saphenous veins was present. A lower arterial study was also done and there was no significant right lower and left lower extremity problems based on a normal toe brachial index bilaterally and the ABI was 1.21 the left and 1.23 on the right. His post ablation venous duplex examination showed successful ablation of the right GS vein with thrombus formation 2 below the right saphenofemoral junction. The deep system was patent without evidence of thrombosis and this was done on 08/30/2016. 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 Addendum: regarding his x-rays done today and x-ray of the left foot -- IMPRESSION: No objective evidence of osteomyelitis. There are soft tissue changes which may reflect cellulitis. X-ray of the right foot -- IMPRESSION:Findings compatible with cellulitis of the toes. No objective evidence of osteomyelitis is observed. 10/01/16 on evaluation today patient's wounds appeared to be doing some better. I did review the x-rays as well which showed no evidence of osteomyelitis although there was evidence on x-ray of cellulitis. He fortunately is not  having any discomfort although he continues to have some swelling. He does not remember being on any antibiotics  recently. 10/15/16 on evaluation today patient's wounds overall appear to be doing better although he does have a new location noted on the left foot. Fortunately he is not having significant pain. It almost has the appearance that something is rubbing on the end of his toes but he wears the open toe shoes and according to what he is telling me never wears anything that would rub on his foot. There is no evidence of infection and specifically no evidence of a fungal infection 10/22/16 On evaluation today patient's wounds appeared to be doing better compared to last week in regard to his bilateral lower extremities. Fortunately I happy with how things are progressing although he still has ulcers I feel like that he is improving and appropriate manner. 11/12/16 on evaluation today patient appears to be doing well in regard to his bilateral feet and the respective wounds. We have been using surrounding her dressings along with an antifungal cream which seems to be doing very well. He has no bilateral dysfunction noticed that the rituals are weight loss at this point. He also has no nausea or vomiting a note purulent discharge. He did see Vein and vascular today and he tells me that they told him he could have surgery for his venous stasis but they did not feel like it was worth it in his words. Fortunately patient's wounds do appear to be getting sneakily better. [name], [name] ([id number]) 11/26/2016 -- he says he is going to have some surgery during this week at [facility] for possibly a colon resection. 12/31/2016 -- the patient has been noncompliant with his smoking and I'm not sure whether he is also started drinking again. He continues to be very nonchalant about his care 01/14/2017 -- the patient's HandP has been reviewed well and I understand he is being compliant with trying to give  up smoking and his local dressing changes. He does not have any surgical options of 4 to him by his vascular surgeons.he was last seen in early August by Dr. Hortencia Pilar who recommended compression stockings,and possibly lymph pumps in 2-3 months after doing a review ultrasound. 01/28/2017 - the patient did not have any fresh complaints but on examination I noted a large lacerated wound on the plantar aspect of his right fourth toe which had a lot of necrotic debris and it probes down to bone. 02/07/2017 -- x-ray of the right foot -- IMPRESSION: Soft tissue swelling about the first through fourth toes consistent with cellulitis. New destructive change in the tuft of the distal phalanx of the great toe is consistent with osteomyelitis. 02/14/2017 -- the patient's MRI is pending this coming Monday and he still continues to smoke. We have again gone over off loading of his wounds in great detail and he says he's been compliant. 02/21/2017 -- MR of the right foot -- IMPRESSION: 1. Soft tissue ulcer at the tip of the first, second and third toe knows. Cortical irregularity and bone marrow edema in the first distal phalanx most concerning for osteomyelitis. Mild marrow edema in the second and third distal phalanx without definite cortical destruction which may reflect early osteomyelitis versus reactive marrow edema. 2. Soft tissue edema surrounding the first phalanx most consistent with cellulitis. the patient was also recently evaluated by his medical oncologist Dr. 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 She is treating him with weekly Procrit. She is also keeping intra-abdominal lymphadenopathy and right lower  lobe lung nodule under observation. 04/04/2017 -- he was seen by Dr. Adrian Prows on 03/25/2017 -- after review he empirically put him on ciprofloxacin and doxycycline as they have good bone penetration and good  bioavailability and it will cover the usual pathogens and diabetic foot osteomyelitis. He will check inflammatory markers and plan a 67-89 week old records. C-reactive protein was 0.3 and the ESR was 72 04/18/17 on evaluation today patient appears to be doing about the same in regard to his lower extremity wounds bilaterally. He has continued to use the antifungal cream which does seem to be beneficial. Nonetheless the ulcers do seem to in some areas be epithelial eyes over and in other areas are still open. He is having no significant discomfort. 04/25/17-he is here in follow-up evaluation for multiple ulcerations to multiple toes bilaterally. He states he did see Dr. Ola Spurr again last week and continues antibiotic therapy. He is voicing no complaints or concerns, will continue with current treatment plan will possibility of adding compression therapy next week after an additional week of treatment/lotions to BLE prescribed by Dr Ola Spurr 05/02/17 he is here in follow up for for multiple ulcers to multiple toes bilaterally. we will stop using antifungal cream and will continue with silvercel and follow up next week 05/09/17-he is here in follow-up for multiple ulcerations to multiple toes bilaterally. There is improvement in appearance. He has not completely stopped using antifungal cream, but admits he has not using it between the toes. He has an appointment with Dr. Ola Spurr on 2/11, continues on doxycycline and Cipro. It has been 5 weeks of antibiotic therapy, we will order plain film xray to evaluate for osteomyelitis next week, prior to follow up with ID. Will continue with silvercel and follow up next week 05/16/17-he is here in follow-up evaluation for multiple ulcerations to multiple toes bilaterally and new wound to the right posterior heel. There is essentially no change in appearance, deteriorating measurements; he has a history of waxing and waning measurements. He admits that he  continues to apply moisturizer/cream/ointment to his toes despite weekly reminders to only apply silvercel to his toes. He states that he thinks the surgical shoe contributed to the superficial ulcer to his posterior heel, he is unable to articulate if this was an area of dry cracked skin as he has a similar area to the left heel. He now is wearing open toed slippers. He has an appointment with Dr. Ola Spurr on 2/11. We have ordered x-rays for her bilateral feet; he was advised to obtain the x-rays today or tomorrow. He will follow-up next week 05/23/17-he is here in follow-up evaluation for multiple ulcerations to multiple toes bilaterally and the right posterior heel. There is improvement in maceration. He has been compliant and not applying any moisturizing agent to his toes. He has been using Lac-Hydrin for his lower extremities with improvement. He did not go to his appointment on Monday with Dr. Ola Spurr secondary to financial concerns. X-rays for her bilateral feet showed: LEFT FOOT with slight erosion of the tuft of the distal phalanges of the left first and second toe suspicious for osteomyelitis, RIGHT FOOT with 1.erosion of the tufts of the distal [name], [name] ([id number]) phalanges of the right first second and possibly third toes consistent with osteomyelitis, 2 no definitive abnormality of the calcaneus is seen on the images obtained, 3. Plantar calcaneal degenerative spur. We briefly discussed hyperbaric adjunctive therapy for treatment of chronic refractory osteomyelitis. I do not find an a1c in  EMR, will contact PCP for record, or order if needed. He has been encouraged to contact Dr Ola Spurr office regarding the follow-up appointment, encouraged him to inquire about payment plan. We will continue with same treatment plan and follow-up next week. He states he is still taking antibiotics and has "a lot" left. He states he has been taking them as directed, 2 pills twice daily.  According to Dr. Blane Ohara office notes he was originally started on 12/17 for 4 weeks and extended on 1/14 for an additional 4 weeks. He should be done with his antibiotic therapy, he was advised to bring his bottles and to his next appointment, we will contact pharmacy. 05/30/17-he is here in follow-up evaluation for multiple ulcerations to multiple toes bilaterally and the right posterior heel. He is accompanied by his brother-in-law. Wounds are stable. He has yet to make up with Dr. Ola Spurr. We contacted his PCP, with no record of recent A1c we will draw an A1c. His brother-in-law states that he was taken off all of his diabetic medication secondary to kidney function. 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 The antibiotics were counted and have approximately 2 weeks left, although they should be complete. The pill organizer reveals missing days. We discussed the need for consistent medications, to have optimal benefit of medication. He has a cousin that lives with him and he will ask her to check his organizer daily. He has been advised to follow up with Dr Ola Spurr, and will go by the office today. He has been advised to quit smoking. 06/06/17-he is here in follow up evaluation. He has had to make an appointment with Dr. Ola Spurr. He did have blood work obtained, a1c 5. He continues to take antibiotic therapy. Significant improvement in bilateral lower extremity edema with compression therapy. Essentially no change in ulcerations to toes. He states he is "going to try something different" and "let me know next week" if it works; he would not provide any additional information and was encouraged to follow our orders. We will follow up next week 06/13/17-he is here in follow-up evaluation. He has an appointment with Dr. Ola Spurr tomorrow morning. He states he purchased an ointment from  [facility] and applied to his toes for 3 days, he does not remember the name of the ointment. There is improvement to his wounds, minimal maceration. He continues to take antibiotic therapy, this should have been completed last month. His brother-in-law who regularly accompanies his appointments was asked to take the bottles to the appointment tomorrow with Dr. Ola Spurr so he is aware. We will continue with 3 layer compression, and order OPEN TOE compression 20-30mmHg; we will apply compression stockings next week. He continues to smoke, smoked "2 cigarettes" last week 06/20/17 on evaluation today patient did receive his compression stockings which he has with him today for both lower extremities. With that being said he tells me at this point in time that he is very happy to have these he really is not a big fan of the compression wraps that we have been utilizing although they have been of great benefit for him. Nonetheless at this point he does want to switch to the compression stockings. In my opinion as long as he is continuing with compression I'm okay with the stockings or the wraps. 06/27/17-he is here in follow-up evaluation for multiple ulcerations to his bilateral toes. There is some improvement in appearance. He is compliant  in wearing his compression stockings with significant improvement in lower extremity edema. He saw Dr Ola Spurr on 3/8, per his notes they would redraw ESR and CRP; plan to continue antibiotic therapy if these remain elevated. I do not see an ESR or CRP level in Epic. The patient continues to take antibiotics. 07/11/17-He is here in follow-up evaluation for multiple ulcerations to multiple toes bilaterally. He presents with complete epithelialization to the right third toe; there has been no deterioration. He continues on antibiotic therapy. He will follow-up next week 07/18/17-He is here in follow-up evaluation for multiple ulcerations to multiple toes bilaterally. He  continues to make improvement. He continues on antibiotic therapy. He states he has been using something additional to our orders, he does not elaborate but states he will bring it in next week. 07/25/17-He is here in follow up evaluation for multiple ulcerations to bilateral toes. He is stable. He has completed antibiotic therapy. He admits to "filing" his toes after showers each evening, this is what he was referencing last week; he does not filing for the wounds. We will switch to [facility] and monitor for any improvement, he will follow-up next week 08/01/17-He is here in follow-up evaluation. He admits to "picking" at his toes after cleansing yesterday, leading to new areas of tissue loss on the bilateral second toe. There is improvement noted to the bilateral great toe. We will dress toes today and hope that they maintain until Monday where he will come in for a nurse visit. He has been advised, multiple times with expressed verbalization, to change the dressings to silvercel if the dressings get wet prior to [facility] appointment. 08/08/17-He is here in follow-up evaluation for bilateral first and second toe ulcerations. There is significant improvement to all ulcerations since last visit. We will switch to [name] to all wounds and he will continue with nurse visits on a Monday/Thursday schedule and follow-up with me in 2 weeks. He continues to smoke, 1-3 cigarettes per day, and has been encouraged to not smoke until his next follow-up in 2 weeks. 08/15/17 on evaluation today patient's ulcers on his toes actually appear to be doing fairly well the right to ulcers may be a little bit more moist compared to the left I'm not really sure exactly why as the openings appear to be very small and I'm not seeing any evidence of anything significant as far as you lightest or otherwise. Nonetheless this may just be a small setback he's [name], [name] ([id number]) been doing very well with the  [facility] Dressing 08/22/17-He is here in follow-up evaluation for ulcerations to his bilateral first and second toes, there is small/scant amount of drainage noted on today's dressing. He continues with Hydrofera Blue. He continues to smoke, 1 cigarette a day. Voices no complaint or concerns, compliant and compression therapy today. He'll follow-up next week 08/29/17-He is here in follow-up evaluation for ulcerations to his bilateral first and second toes, with a new wound to the left third toe. He states he cut himself while cutting the toenail. He presents today with more maceration and increased measurements; waxing and waning measurements and moisture has been an ongoing issue. He continues to smoke a proximally 1 cigarette per day, but states he has not smoked since Sunday. We will initiate medihoney daily and evaluate next week. He presents today without compression stockings 09/05/17-He is here in follow-up evaluation for ulcerations to bilateral first and second and left third toe. He did not pick up the  medihoney and therefore has not been changing his dressing. He admits to inconsistent where of compression stockings, admits to pain to his bilateral lower extremities with unilateral edema (right greater than left). The ulcerations to multiple toes are more macerated and larger in size than last week. He is wearing compression therapy today. He admits to an overall feeling of weakness and fatigue and has been encouraged to contact his PCP for evaluation; he has a known history of anemia and intraabdominal lymphadenopathy. We will return to silvercel and he will follow up next week 09/19/17-He is here in follow-up evaluation for ulcerations to bilateral first and second toe.he was unable to make last week's appointment. The culture that was obtained on 5/30 grew clindamycin sensitive MRSA; he was initiated on 6/4 but did not start it until 6/7. There is significant improvement in all  wounds, healing to left second and third toe. He also admits to having no alcohol or smoking for the last 6 days. We will continue with same treatment plan I will extend the clindamycin for an additional 10 days and he will follow-up next week. 09/26/17- He is here in follow evaluation for multiple ulcerations to multiple toes bilaterally. He continues to be non-compliant with dressing, compression therapy. He states he is taking the antibiotics as prescribed, although his recall is inconsistent. There is noted deterioration in all ulcers. We will continue same treatment plan, antibiotic therapy and he will follow up next week. 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 This information is obtained from the medical record, as he cannot articulate specifics to his diagnosis or treatment plan. 10/17/17-He is here in follow-up evaluation for multiple ulcerations to multiple toes bilaterally. There is chronic waxing and waning improvement/deterioration. On today's exam they appear improved, dry without maceration. He continues to apply a variety of topical agents, stating that he has switched between the blue product and silver product. In my opinion, this will be a chronic waxing and waning giving his intermittent compliance. He will follow-up in 2 weeks. As it relates to the biopsy taken from his buttock, he has not followed up with that and has not started radiation therapy. 10/31/17-He is here for evaluation for multiple ulcerations to multiple toes bilaterally. Continues with waxing and waning improvement versus deterioration. They are stable on today's exam without significant maceration. 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 We will continue with every 2 week follow-up appointments 11/14/17- He  is seen in follow-up evaluation for multiple ulcerations to multiple bilateral toes. He is scheduled for excision of basal cell carcinoma to his right buttock on Monday 8/12. He continues to have chronic waxing and waning of his ulcers; today is a stable day with minimal drainage and left ear is better than right toes. I will recount to infectious disease regarding chronic suppressive therapy. He admits to being more compliant with compression stockings, although he is not wearing any today. He has been encouraged to continue with dressing changes as ordered and compression therapy daily. He will be seen in 2 weeks 11/28/17-He is seen in follow up evaluation for multiple ulcerations to multiple toes bilaterally. He had an overnight admission at [facility] (8/11-8/12) for symptomatic anemia, received two units prbc. His surgical date has been moved to 9/94 excision of basal cell carcinoma to  the right buttock. He has been noncompliant in wearing compression stockings, applying topical treatment as ordered; he has been applying a "cream", Hydrofera Blue, silvercel. He is currently on no antibiotic therapy; we will send for re-referral to ID for suppressive therapy given his multiple co-morbidities, poor surgical candidate, unwilling to have toe amputation and general non-adherence. 12/19/17 on evaluation today patient actually appears to be doing very well in regard to his toes at least compared to last time I saw him. He again has been coming to our office for quite a bit of time. 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 He states is hurting a little bit but doing well in general. I'm see were not taking care of this area dermatology is. He does have his appointment with infectious disease upcoming which they re-consult for suppressive therapy that's next Monday. 01/02/18 on evaluation  today patient actually appears to be doing rather well in regard to his toe ulcers although he tells me he has been put in an ointment on this which I am concerned may be causing some additional moisture buildup which we really do not want. He supposed be using silver cell which is mainly thing I want him to be utilizing. Fortunately there does not appear to be any evidence of worsening infection which is good news. He has had surgery on his gluteal region where he had an excision of a cancerous area fortunately that seems to be doing well although he is having some discomfort. 02/06/18 on evaluation today patient actually appears to be doing well at all locations except for his second toe use bilaterally Sinor, Dunbar E. ([id number]) where the distal tip is still wrong open. He did state that he can't stand for dry skin to buildup on his toes subsequently as a question and further it sounds as if he actually pills this all which is likely keeping the toe is wrong and open. Also think that's what happened in the bottom of his foot although that appears to have healed at this point. There does not appear to be any evidence of infection. 02/20/18 on evaluation today patient appears to be doing better in regard to his foot ulcers. Everything has shown signs of improvement which is excellent news. Overall I'm very pleased with how things appear currently. The patient states he's not picking her point at any skin and that he wears shoes at all times when he is ambulating at home. Both are things that I've advocated and recommended for him in the past. 03/13/18 on evaluation today patient actually appears to be doing very well in regard to the bilateral toes in the overall appearance of the wound bed locations. Fortunately there does not appear to be any signs of infection at this time. He states that he is very pleased with how things seem to be progressing to be honest as am I. 03/27/1899 evaluation today  patient appears to be doing well in regard to his toe ulcers. He just has a few areas remaining and these appear to be showing signs of improvement as well which is great news. Overall I have been impressed with the progress of the past several weeks. 04/10/18 on evaluation today patient appears to be doing a little bit worse in regard to the right plantar foot at the regional the first metatarsal as well as the left second toe. He tells me that  he ran out of bandages he also tells me he's been wearing his flip-flops a lot at home as well as a new pair of boots that he received as well. This is as opposed to the postop shoes that we had previously given him. Nonetheless overall I feel like that he has had a little bit more friction to the area which is causing things to look a little bit worse today. 04/24/18 on evaluation today patient's wounds for the most part appear to be doing excellent especially in regard to the bilateral second toes. With that being said he does have issues with the wound still on the first metatarsal region and first toe of the right foot which have not completely closed at this point. Nonetheless I do feel like that he is making progress which is good news. 05/08/18 on evaluation today patient's wounds appear to be doing just a little bit worse than normal simply due to the fact that he has not had dressing supplies. He apparently owed $35 in order for them to be shipped and unfortunately is not able to pay that currently. We did give him information for the [employer] today which will hopefully help cover his dressing supplies. He was thankful for this he states is gonna contact them this afternoon. Fortunately otherwise or does not hear any signs of infection. Electronic Signature(s) Signed: 05/08/2018 9:52:35 PM By: Worthy Keeler PA-C Entered By: Worthy Keeler on 05/08/2018 09:12:54 Kump, [name]  ([id number]) -------------------------------------------------------------------------------- Physical Exam Details Patient Name: Boeder, Chancellor E. Date of Service: 05/08/2018 8:30 AM Medical Record Number: [id number] Patient Account Number: [id number] Date of Birth/Sex: 1950/04/10 (68 y.o. M) Treating RN: Harold Barban Primary Care Provider: Lamonte Sakai Other Clinician: Referring Provider: Lamonte Sakai Treating Provider/Extender: Melburn Hake, HOYT Weeks in Treatment: 76 Constitutional Well-nourished and well-hydrated in no acute distress. Respiratory normal breathing without difficulty. clear to auscultation bilaterally. Cardiovascular regular rate and rhythm with normal S1, S2. Psychiatric this patient is able to make decisions and demonstrates good insight into disease process. Alert and Oriented x 3. pleasant and cooperative. Notes Patient's wound currently appear to be somewhat more macerated than normal he had been doing very well these areas are not doing quite as well today simply due to the fact that he has not had dressing supplies. Fortunately there is no evidence of infection. Electronic Signature(s) Signed: 05/08/2018 9:52:35 PM By: Worthy Keeler PA-C Entered By: Worthy Keeler on 05/08/2018 09:13:20 [name], [name] ([id number]) -------------------------------------------------------------------------------- Physician Orders Details Patient Name: [name], [name] Date of Service: 05/08/2018 8:30 AM Medical Record Number: [id number] Patient Account Number: [id number] Date of Birth/Sex: 1951-02-01 (68 y.o. M) Treating RN: Harold Barban Primary Care Provider: Lamonte Sakai Other Clinician: Referring Provider: Lamonte Sakai Treating Provider/Extender: Melburn Hake, HOYT Weeks in Treatment: 77 Verbal / Phone Orders: No Diagnosis Coding Wound Cleansing Wound #16 Right,Plantar Toe Great o Clean wound with Normal Saline. Wound #17 Right,Distal,Plantar Metatarsal head  second o Clean wound with Normal Saline. Anesthetic (add to Medication List) Wound #16 Right,Plantar Toe Great o Topical Lidocaine 4% cream applied to wound bed prior to debridement (In Clinic Only). Wound #17 Right,Distal,Plantar Metatarsal head second o Topical Lidocaine 4% cream applied to wound bed prior to debridement (In Clinic Only). Primary Wound Dressing Wound #16 Right,Plantar Toe Great o Silver Alginate - secure with tape Wound #17 Right,Distal,Plantar Metatarsal head second o Silver Alginate - secure with tape Dressing Change Frequency Wound #16 Right,Plantar Toe Great o Change  dressing every day. Wound #17 Right,Distal,Plantar Metatarsal head second o Change dressing every day. o Change dressing every day. Follow-up Appointments Wound #16 Right,Plantar Toe Great o Return Appointment in 2 weeks. Wound #17 Right,Distal,Plantar Metatarsal head second o Return Appointment in 2 weeks. o Return Appointment in 2 weeks. Edema Control Wound #16 Right,Plantar Toe Great o Patient to wear own compression stockings Wound #17 Right,Distal,Plantar Metatarsal head second o Patient to wear own compression stockings [name], [name] ([id number]) Additional Orders / Instructions Wound #16 Right,Plantar Toe Great o Stop Smoking o Increase protein intake. Wound #17 Right,Distal,Plantar Metatarsal head second o Stop Smoking o Increase protein intake. Electronic Signature(s) Signed: 05/08/2018 9:52:35 PM By: Worthy Keeler PA-C Signed: 06/16/2018 10:53:34 AM By: Harold Barban Entered By: Harold Barban on 05/08/2018 09:11:13 Maillet, [name] ([id number]) -------------------------------------------------------------------------------- Problem List Details Patient Name: Sainz, Ronn E. Date of Service: 05/08/2018 8:30 AM Medical Record Number: [id number] Patient Account Number: [id number] Date of Birth/Sex: October 09, 1950 (68 y.o. M) Treating RN: Harold Barban Primary Care Provider: Lamonte Sakai Other Clinician: Referring Provider: Lamonte Sakai Treating Provider/Extender: Melburn Hake, HOYT Weeks in Treatment: 25 Active Problems ICD-10 Evaluated Encounter Code Description Active Date Today Diagnosis L97.521 Non-pressure chronic ulcer of other part of left foot limited to 09/21/2016 No Yes breakdown of skin L97.511 Non-pressure chronic ulcer of other part of right foot limited to 09/21/2016 No Yes breakdown of skin E11.621 Type 2 diabetes mellitus with foot ulcer 09/21/2016 No Yes I87.323 Chronic venous hypertension (idiopathic) with inflammation of 09/21/2016 No Yes bilateral lower extremity I89.0 Lymphedema, not elsewhere classified 09/21/2016 No Yes F17.218 Nicotine dependence, cigarettes, with other nicotine-induced 09/21/2016 No Yes disorders F10.19 Alcohol abuse with unspecified alcohol-induced disorder 09/21/2016 No Yes M86.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, right ankle and foot 02/21/2017 No Yes Inactive Problems Resolved Problems Calender, YICHEN GILARDI ([id number]) Electronic Signature(s) Signed: 05/08/2018 9:52:35 PM By: Worthy Keeler PA-C Entered By: Worthy Keeler on 05/08/2018 09:12:16 [place]ki, [name] ([id number]) -------------------------------------------------------------------------------- Progress Note Details Patient Name: Brasil, Gerron E. Date of Service: 05/08/2018 8:30 AM Medical Record Number: [id number] Patient Account Number: [id number] Date of Birth/Sex: 1950/04/30 (68 y.o. M) Treating RN: Harold Barban Primary Care Provider: Lamonte Sakai Other Clinician: Referring Provider: Lamonte Sakai Treating Provider/Extender: Melburn Hake, HOYT Weeks in Treatment: 66 Subjective Chief Complaint Information obtained from Patient Patient presents for treatment of an open diabetic ulcer to both feet History of Present Illness (HPI) 68 year old patient here to see as for bilateral feet ulceration to on his left first and second  toe and 2 on his right first and second toe, which she's had for about 4 months. 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 Donella Stade. The patient is also undergoing workup by medical oncology for a intra-abdominal lymphadenopathy. 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 He is also status post appendectomy, inguinal lymph node biopsy, rectal biopsy and rectal examination under anesthesia. he currently smokes cigarettes about half packet a day. In March of this year he was seen by Dr. Hortencia Pilar, for evaluation of bilateral varicose veins and besides wearing compression stockings he had recommended laser ablation of the right and left great saphenous veins to eleviate the symptoms and complications of severe superficial venous reflux disease. He also recommended lymphedema pumps for better  control of his lymphedema. The patient recently has had on 08/23/2016, right greater saphenous vein ablation with the laser energy Earlier lower extremity venous reflux examination done on 05/08/2016 showed no DVT or SVT both lower legs but incompetence of bilateral great saphenous veins was present. A lower arterial study was also done and there was no significant right lower and left lower extremity problems based on a normal toe brachial index bilaterally and the ABI was 1.21 the left and 1.23 on the right. His post ablation venous duplex examination showed successful ablation of the right GS vein with thrombus formation 2 below the right saphenofemoral junction. The deep system was patent without evidence of thrombosis and this was done on 08/30/2016. the patient also has a squamous cell cancer of the skin of the buttock and is recently undergone radiation  therapy for this prior to excisional surgery. Addendum: regarding his x-rays done today and x-ray of the left foot -- IMPRESSION: No objective evidence of osteomyelitis. There are soft tissue changes which may reflect cellulitis. X-ray of the right foot -- IMPRESSION:Findings compatible with cellulitis of the toes. No objective evidence of osteomyelitis is observed. 10/01/16 on evaluation today patient's wounds appeared to be doing some better. I did review the x-rays as well which showed no evidence of osteomyelitis although there was evidence on x-ray of cellulitis. He fortunately is not having any discomfort although he continues to have some swelling. He does not remember being on any antibiotics recently. 10/15/16 on evaluation today patient's wounds overall appear to be doing better although he does have a new location noted on the left foot. Fortunately he is not having significant pain. It almost has the appearance that something is rubbing on the end of his toes but he wears the open toe shoes and according to what he is telling me never wears anything that would rub on his foot. There is no evidence of infection and specifically no evidence of a fungal infection 10/22/16 On evaluation today patient's wounds appeared to be doing better compared to last week in regard to his bilateral lower extremities. Fortunately I happy with how things are progressing although he still has ulcers I feel like that he is improving and appropriate manner. KAREE, CHRISTOPHERSON ([id number]) 11/12/16 on evaluation today patient appears to be doing well in regard to his bilateral feet and the respective wounds. We have been using surrounding her dressings along with an antifungal cream which seems to be doing very well. He has no bilateral dysfunction noticed that the rituals are weight loss at this point. He also has no nausea or vomiting a note purulent discharge. He did see Vein and vascular today and he tells me that  they told him he could have surgery for his venous stasis but they did not feel like it was worth it in his words. Fortunately patient's wounds do appear to be getting sneakily better. 11/26/2016 -- he says he is going to have some surgery during this week at [facility] for possibly a colon resection. 12/31/2016 -- the patient has been noncompliant with his smoking and I'm not sure whether he is also started drinking again. He continues to be very nonchalant about his care 01/14/2017 -- the patient's HandP has been reviewed well and I understand he is being compliant with trying to give up smoking and his local dressing changes. He does not have any surgical options of 4 to him by his vascular surgeons.he was last seen in early August by  Dr. Hortencia Pilar who recommended compression stockings,and possibly lymph pumps in 2-3 months after doing a review ultrasound. 01/28/2017 - the patient did not have any fresh complaints but on examination I noted a large lacerated wound on the plantar aspect of his right fourth toe which had a lot of necrotic debris and it probes down to bone. 02/07/2017 -- x-ray of the right foot -- IMPRESSION: Soft tissue swelling about the first through fourth toes consistent with cellulitis. New destructive change in the tuft of the distal phalanx of the great toe is consistent with osteomyelitis. 02/14/2017 -- the patient's MRI is pending this coming Monday and he still continues to smoke. We have again gone over off loading of his wounds in great detail and he says he's been compliant. 02/21/2017 -- MR of the right foot -- IMPRESSION: 1. Soft tissue ulcer at the tip of the first, second and third toe knows. Cortical irregularity and bone marrow edema in the first distal phalanx most concerning for osteomyelitis. Mild marrow edema in the second and third distal phalanx without definite cortical destruction which may reflect early osteomyelitis versus reactive marrow  edema. 2. Soft tissue edema surrounding the first phalanx most consistent with cellulitis. the patient was also recently evaluated by his medical oncologist Dr. 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 She is treating him with weekly Procrit. She is also keeping intra-abdominal lymphadenopathy and right lower lobe lung nodule under observation. 04/04/2017 -- he was seen by Dr. Adrian Prows on 03/25/2017 -- after review he empirically put him on ciprofloxacin and doxycycline as they have good bone penetration and good bioavailability and it will cover the usual pathogens and diabetic foot osteomyelitis. He will check inflammatory markers and plan a 9-27 week old records. C-reactive protein was 0.3 and the ESR was 72 04/18/17 on evaluation today patient appears to be doing about the same in regard to his lower extremity wounds bilaterally. He has continued to use the antifungal cream which does seem to be beneficial. Nonetheless the ulcers do seem to in some areas be epithelial eyes over and in other areas are still open. He is having no significant discomfort. 04/25/17-he is here in follow-up evaluation for multiple ulcerations to multiple toes bilaterally. He states he did see Dr. Ola Spurr again last week and continues antibiotic therapy. He is voicing no complaints or concerns, will continue with current treatment plan will possibility of adding compression therapy next week after an additional week of treatment/lotions to BLE prescribed by Dr Ola Spurr 05/02/17 he is here in follow up for for multiple ulcers to multiple toes bilaterally. we will stop using antifungal cream and will continue with silvercel and follow up next week 05/09/17-he is here in follow-up for multiple ulcerations to multiple toes bilaterally. There is improvement in appearance. He has not completely stopped using antifungal cream, but admits he has  not using it between the toes. He has an appointment with Dr. Ola Spurr on 2/11, continues on doxycycline and Cipro. It has been 5 weeks of antibiotic therapy, we will order plain film xray to evaluate for osteomyelitis next week, prior to follow up with ID. Will continue with silvercel and follow up next week 05/16/17-he is here in follow-up evaluation for multiple ulcerations to multiple toes bilaterally and new wound to the right posterior heel. There is essentially no change in appearance, deteriorating measurements; he has a history of waxing and waning measurements. He admits that  he continues to apply moisturizer/cream/ointment to his toes despite weekly reminders to only apply silvercel to his toes. He states that he thinks the surgical shoe contributed to the superficial ulcer to his posterior heel, he is unable to articulate if this was an area of dry cracked skin as he has a similar area to the left heel. He now is wearing open toed slippers. He has an appointment with Dr. Ola Spurr on 2/11. We have ordered x-rays for her bilateral feet; Leavelle, Bensen E. ([id number]) he was advised to obtain the x-rays today or tomorrow. He will follow-up next week 05/23/17-he is here in follow-up evaluation for multiple ulcerations to multiple toes bilaterally and the right posterior heel. There is improvement in maceration. He has been compliant and not applying any moisturizing agent to his toes. He has been using Lac-Hydrin for his lower extremities with improvement. He did not go to his appointment on Monday with Dr. Ola Spurr secondary to financial concerns. X-rays for her bilateral feet showed: LEFT FOOT with slight erosion of the tuft of the distal phalanges of the left first and second toe suspicious for osteomyelitis, RIGHT FOOT with 1.erosion of the tufts of the distal phalanges of the right first second and possibly third toes consistent with osteomyelitis, 2 no definitive abnormality of  the calcaneus is seen on the images obtained, 3. Plantar calcaneal degenerative spur. We briefly discussed hyperbaric adjunctive therapy for treatment of chronic refractory osteomyelitis. I do not find an a1c in EMR, will contact PCP for record, or order if needed. He has been encouraged to contact Dr Ola Spurr office regarding the follow-up appointment, encouraged him to inquire about payment plan. We will continue with same treatment plan and follow-up next week. He states he is still taking antibiotics and has "a lot" left. He states he has been taking them as directed, 2 pills twice daily. According to Dr. Blane Ohara office notes he was originally started on 12/17 for 4 weeks and extended on 1/14 for an additional 4 weeks. He should be done with his antibiotic therapy, he was advised to bring his bottles and to his next appointment, we will contact pharmacy. 05/30/17-he is here in follow-up evaluation for multiple ulcerations to multiple toes bilaterally and the right posterior heel. He is accompanied by his brother-in-law. Wounds are stable. He has yet to make up with Dr. Ola Spurr. We contacted his PCP, with no record of recent A1c we will draw an A1c. His brother-in-law states that he was taken off all of his diabetic medication secondary to kidney function. 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 The antibiotics were counted and have approximately 2 weeks left, although they should be complete. The pill organizer reveals missing days. We discussed the need for consistent medications, to have optimal benefit of medication. He has a cousin that lives with him and he will ask her to check his organizer daily. He has been advised to follow up with Dr Ola Spurr, and will go by the office today. He has been advised to quit smoking. 06/06/17-he is here in follow up evaluation. He has had to make  an appointment with Dr. Ola Spurr. He did have blood work obtained, a1c 5. He continues to take antibiotic therapy. Significant improvement in bilateral lower extremity edema with compression therapy. Essentially no change in ulcerations to toes. He states he is "going to try something different" and "let me know next week" if it works;  he would not provide any additional information and was encouraged to follow our orders. We will follow up next week 06/13/17-he is here in follow-up evaluation. He has an appointment with Dr. Ola Spurr tomorrow morning. He states he purchased an ointment from [facility] and applied to his toes for 3 days, he does not remember the name of the ointment. There is improvement to his wounds, minimal maceration. He continues to take antibiotic therapy, this should have been completed last month. His brother-in-law who regularly accompanies his appointments was asked to take the bottles to the appointment tomorrow with Dr. Ola Spurr so he is aware. We will continue with 3 layer compression, and order OPEN TOE compression 20-30mmHg; we will apply compression stockings next week. He continues to smoke, smoked "2 cigarettes" last week 06/20/17 on evaluation today patient did receive his compression stockings which he has with him today for both lower extremities. With that being said he tells me at this point in time that he is very happy to have these he really is not a big fan of the compression wraps that we have been utilizing although they have been of great benefit for him. Nonetheless at this point he does want to switch to the compression stockings. In my opinion as long as he is continuing with compression I'm okay with the stockings or the wraps. 06/27/17-he is here in follow-up evaluation for multiple ulcerations to his bilateral toes. There is some improvement in appearance. He is compliant in wearing his compression stockings with significant improvement in lower  extremity edema. He saw Dr Ola Spurr on 3/8, per his notes they would redraw ESR and CRP; plan to continue antibiotic therapy if these remain elevated. I do not see an ESR or CRP level in Epic. The patient continues to take antibiotics. 07/11/17-He is here in follow-up evaluation for multiple ulcerations to multiple toes bilaterally. He presents with complete epithelialization to the right third toe; there has been no deterioration. He continues on antibiotic therapy. He will follow-up next week 07/18/17-He is here in follow-up evaluation for multiple ulcerations to multiple toes bilaterally. He continues to make improvement. He continues on antibiotic therapy. He states he has been using something additional to our orders, he does not elaborate but states he will bring it in next week. 07/25/17-He is here in follow up evaluation for multiple ulcerations to bilateral toes. He is stable. He has completed antibiotic therapy. He admits to "filing" his toes after showers each evening, this is what he was referencing last week; he does not filing for the wounds. We will switch to [facility] and monitor for any improvement, he will follow-up next week 08/01/17-He is here in follow-up evaluation. He admits to "picking" at his toes after cleansing yesterday, leading to new areas of tissue loss on the bilateral second toe. There is improvement noted to the bilateral great toe. We will dress toes today and hope that they maintain until Monday where he will come in for a nurse visit. He has been advised, multiple times with expressed verbalization, to change the dressings to silvercel if the dressings get wet prior to [facility] appointment. 08/08/17-He is here in follow-up evaluation for bilateral first and second toe ulcerations. There is significant improvement to all ulcerations since last visit. We will switch to [facility] to all wounds and he will continue with nurse visits on a Kamel, Champion E.  ([id number]) Monday/Thursday schedule and follow-up with me in 2 weeks. He continues to smoke, 1-3 cigarettes per day, and  has been encouraged to not smoke until his next follow-up in 2 weeks. 08/15/17 on evaluation today patient's ulcers on his toes actually appear to be doing fairly well the right to ulcers may be a little bit more moist compared to the left I'm not really sure exactly why as the openings appear to be very small and I'm not seeing any evidence of anything significant as far as you lightest or otherwise. Nonetheless this may just be a small setback he's been doing very well with the [facility] Dressing 08/22/17-He is here in follow-up evaluation for ulcerations to his bilateral first and second toes, there is small/scant amount of drainage noted on today's dressing. He continues with Hydrofera Blue. He continues to smoke, 1 cigarette a day. Voices no complaint or concerns, compliant and compression therapy today. He'll follow-up next week 08/29/17-He is here in follow-up evaluation for ulcerations to his bilateral first and second toes, with a new wound to the left third toe. He states he cut himself while cutting the toenail. He presents today with more maceration and increased measurements; waxing and waning measurements and moisture has been an ongoing issue. He continues to smoke a proximally 1 cigarette per day, but states he has not smoked since Sunday. We will initiate medihoney daily and evaluate next week. He presents today without compression stockings 09/05/17-He is here in follow-up evaluation for ulcerations to bilateral first and second and left third toe. He did not pick up the medihoney and therefore has not been changing his dressing. He admits to inconsistent where of compression stockings, admits to pain to his bilateral lower extremities with unilateral edema (right greater than left). The ulcerations to multiple toes are more macerated and larger in size than  last week. He is wearing compression therapy today. He admits to an overall feeling of weakness and fatigue and has been encouraged to contact his PCP for evaluation; he has a known history of anemia and intraabdominal lymphadenopathy. We will return to silvercel and he will follow up next week 09/19/17-He is here in follow-up evaluation for ulcerations to bilateral first and second toe.he was unable to make last week's appointment. The culture that was obtained on 5/30 grew clindamycin sensitive MRSA; he was initiated on 6/4 but did not start it until 6/7. There is significant improvement in all wounds, healing to left second and third toe. He also admits to having no alcohol or smoking for the last 6 days. We will continue with same treatment plan I will extend the clindamycin for an additional 10 days and he will follow-up next week. 09/26/17- He is here in follow evaluation for multiple ulcerations to multiple toes bilaterally. He continues to be non-compliant with dressing, compression therapy. He states he is taking the antibiotics as prescribed, although his recall is inconsistent. There is noted deterioration in all ulcers. We will continue same treatment plan, antibiotic therapy and he will follow up next week. 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 This information is obtained from the medical record, as he cannot articulate specifics to his diagnosis or treatment plan. 10/17/17-He is here in follow-up evaluation for multiple ulcerations to multiple toes bilaterally. There is chronic waxing and waning improvement/deterioration. On today's exam they appear improved, dry without maceration. He continues to apply a variety of topical agents, stating that he  has switched between the blue product and silver product. In my  opinion, this will be a chronic waxing and waning giving his intermittent compliance. He will follow-up in 2 weeks. As it relates to the biopsy taken from his buttock, he has not followed up with that and has not started radiation therapy. 10/31/17-He is here for evaluation for multiple ulcerations to multiple toes bilaterally. Continues with waxing and waning improvement versus deterioration. They are stable on today's exam without significant maceration. 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 We will continue with every 2 week follow-up appointments 11/14/17- He is seen in follow-up evaluation for multiple ulcerations to multiple bilateral toes. He is scheduled for excision of basal cell carcinoma to his right buttock on Monday 8/12. He continues to have chronic waxing and waning of his ulcers; today is a stable day with minimal drainage and left ear is better than right toes. I will recount to infectious disease regarding chronic suppressive therapy. He admits to being more compliant with compression stockings, although he is not wearing any today. He has been encouraged to continue with dressing changes as ordered and compression therapy daily. He will be seen in 2 weeks 11/28/17-He is seen in follow up evaluation for multiple ulcerations to multiple toes bilaterally. He had an overnight admission at [facility] (8/11-8/12) for symptomatic anemia, received two units prbc. His surgical date has been moved to 9/94 excision of basal cell carcinoma to the right buttock. He has been noncompliant in wearing compression stockings, applying topical treatment as ordered; he has been applying a "cream", Hydrofera Blue, silvercel. He is currently on no antibiotic therapy; we will send for re-referral to ID for suppressive therapy given his multiple co-morbidities, poor surgical candidate, unwilling to have toe amputation and general non-adherence. 12/19/17 on evaluation  today patient actually appears to be doing very well in regard to his toes at least compared to last time I saw him. He again has been coming to our office for quite a bit of time. 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 He states is hurting a little bit but doing well in general. I'm see were not taking care of this area dermatology is. He does have his appointment with infectious disease upcoming which they re-consult for suppressive therapy that's next Monday. 01/02/18 on evaluation today patient actually appears to be doing rather well in regard to his toe ulcers although he tells me he Tapscott, Jacolby E. ([id number]) has been put in an ointment on this which I am concerned may be causing some additional moisture buildup which we really do not want. He supposed be using silver cell which is mainly thing I want him to be utilizing. Fortunately there does not appear to be any evidence of worsening infection which is good news. He has had surgery on his gluteal region where he had an excision of a cancerous area fortunately that seems to be doing well although he is having some discomfort. 02/06/18 on evaluation today patient actually appears to be doing well at all locations except for his second toe use bilaterally where the distal tip is still wrong open. He did state that he can't stand for dry skin to buildup on his toes subsequently as a question and further it sounds as if he actually pills this all which is likely keeping the toe is wrong and  open. Also think that's what happened in the bottom of his foot although that appears to have healed at this point. There does not appear to be any evidence of infection. 02/20/18 on evaluation today patient appears to be doing better in regard to his foot ulcers. Everything has shown signs of improvement which is excellent news. Overall I'm very  pleased with how things appear currently. The patient states he's not picking her point at any skin and that he wears shoes at all times when he is ambulating at home. Both are things that I've advocated and recommended for him in the past. 03/13/18 on evaluation today patient actually appears to be doing very well in regard to the bilateral toes in the overall appearance of the wound bed locations. Fortunately there does not appear to be any signs of infection at this time. He states that he is very pleased with how things seem to be progressing to be honest as am I. 03/27/1899 evaluation today patient appears to be doing well in regard to his toe ulcers. He just has a few areas remaining and these appear to be showing signs of improvement as well which is great news. Overall I have been impressed with the progress of the past several weeks. 04/10/18 on evaluation today patient appears to be doing a little bit worse in regard to the right plantar foot at the regional the first metatarsal as well as the left second toe. He tells me that he ran out of bandages he also tells me he's been wearing his flip-flops a lot at home as well as a new pair of boots that he received as well. This is as opposed to the postop shoes that we had previously given him. Nonetheless overall I feel like that he has had a little bit more friction to the area which is causing things to look a little bit worse today. 04/24/18 on evaluation today patient's wounds for the most part appear to be doing excellent especially in regard to the bilateral second toes. With that being said he does have issues with the wound still on the first metatarsal region and first toe of the right foot which have not completely closed at this point. Nonetheless I do feel like that he is making progress which is good news. 05/08/18 on evaluation today patient's wounds appear to be doing just a little bit worse than normal simply due to the fact  that he has not had dressing supplies. He apparently owed $35 in order for them to be shipped and unfortunately is not able to pay that currently. We did give him information for the Kevin today which will hopefully help cover his dressing supplies. He was thankful for this he states is gonna contact them this afternoon. Fortunately otherwise or does not hear any signs of infection. Patient History Information obtained from Patient. Social History Current every day smoker, Marital Status - Widowed, Alcohol Use - Daily - quit drinking about a week ago, Drug Use - No History, Caffeine Use - Moderate. Medical History Eyes Denies history of Cataracts, Glaucoma, Optic Neuritis Ear/Nose/Mouth/Throat Denies history of Chronic sinus problems/congestion, Middle ear problems Hematologic/Lymphatic Patient has history of Anemia, Lymphedema Denies history of Hemophilia, Human Immunodeficiency Virus, Sickle Cell Disease Respiratory Denies history of Aspiration, Asthma, Chronic Obstructive Pulmonary Disease (COPD), Pneumothorax, Sleep Apnea, Tuberculosis Trias, Mussa E. ([id number]) Cardiovascular Patient has history of Congestive Heart Failure, Hypertension, Peripheral Venous Disease Denies history of Angina, Arrhythmia, Coronary Artery Disease, Deep  Vein Thrombosis, Hypotension, Myocardial Infarction, Peripheral Arterial Disease, Phlebitis, Vasculitis Gastrointestinal Denies history of Cirrhosis , Colitis, Crohn s, Hepatitis A, Hepatitis B, Hepatitis C Endocrine Patient has history of Type II Diabetes Genitourinary Denies history of End Stage Renal Disease Immunological Denies history of Lupus Erythematosus, Raynaud s, Scleroderma Integumentary (Skin) Denies history of History of Burn, History of pressure wounds Musculoskeletal Denies history of Gout, Rheumatoid Arthritis, Osteoarthritis, Osteomyelitis Neurologic Patient has history of Neuropathy Denies history of  Dementia, Quadriplegia, Paraplegia, Seizure Disorder Medical And Surgical History Notes Oncologic 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 Review of Systems (ROS) Constitutional Symptoms (General Health) Denies complaints or symptoms of Fever, Chills. Respiratory The patient has no complaints or symptoms. Cardiovascular The patient has no complaints or symptoms. Psychiatric The patient has no complaints or symptoms. Objective Constitutional Well-nourished and well-hydrated in no acute distress. Vitals Time Taken: 8:44 AM, Height: 69 in, Weight: 168 lbs, BMI: 24.8, Temperature: 98.2 F, Pulse: 87 bpm, Respiratory Rate: 16 breaths/min, Blood Pressure: 168/79 mmHg. Respiratory normal breathing without difficulty. clear to auscultation bilaterally. Cardiovascular regular rate and rhythm with normal S1, S2. Psychiatric this patient is able to make decisions and demonstrates good insight into disease process. Alert and Oriented x 3. pleasant [name], [name] ([id number]) and cooperative. General Notes: Patient's wound currently appear to be somewhat more macerated than normal he had been doing very well these areas are not doing quite as well today simply due to the fact that he has not had dressing supplies. Fortunately there is no evidence of infection. Integumentary (Hair, Skin) Wound #16 status is Open. Original cause of wound was Not Known. The wound is located on the [employer]. The wound measures 1.1cm length x 1.7cm width x 0.1cm depth; 1.469cm^2 area and 0.147cm^3 volume. There is Fat Layer (Subcutaneous Tissue) Exposed exposed. There is no tunneling or undermining noted. There is a medium amount of serous drainage noted. The wound margin is flat and intact. There is large (67-100%) pink granulation within the wound bed. There is a small (1-33%) amount of necrotic tissue within the wound bed including Adherent Slough. The periwound skin  appearance exhibited: Maceration. The periwound skin appearance did not exhibit: Callus, Crepitus, Excoriation, Induration, Rash, Scarring, Dry/Scaly, Atrophie Blanche, Cyanosis, Ecchymosis, Hemosiderin Staining, Mottled, Pallor, Rubor, Erythema. Periwound temperature was noted as No Abnormality. Wound #17 status is Open. Original cause of wound was Trauma. The wound is located on the Right,Distal,Plantar Metatarsal head second. The wound measures 1.4cm length x 0.8cm width x 0.1cm depth; 0.88cm^2 area and 0.088cm^3 volume. There is Fat Layer (Subcutaneous Tissue) Exposed exposed. There is no tunneling or undermining noted. There is a medium amount of serous drainage noted. The wound margin is flat and intact. There is large (67-100%) pink granulation within the wound bed. There is a small (1-33%) amount of necrotic tissue within the wound bed including Adherent Slough. The periwound skin appearance exhibited: Maceration. The periwound skin appearance did not exhibit: Callus, Crepitus, Excoriation, Induration, Rash, Scarring, Dry/Scaly, Atrophie Blanche, Cyanosis, Ecchymosis, Hemosiderin Staining, Mottled, Pallor, Rubor, Erythema. Periwound temperature was noted as No Abnormality. Wound #2 status is Open. Original cause of wound was Gradually Appeared. The wound is located on the Right Toe Second. The wound measures 0.5cm length x 1.3cm width x 0.1cm depth; 0.511cm^2 area and 0.051cm^3 volume. The wound is limited to skin breakdown. There is no tunneling or undermining noted. There is a medium amount of serous drainage noted. The wound margin is indistinct  and nonvisible. There is large (67-100%) pink granulation within the wound bed. There is no necrotic tissue within the wound bed. The periwound skin appearance did not exhibit: Callus, Crepitus, Excoriation, Induration, Rash, Scarring, Dry/Scaly, Maceration, Atrophie Blanche, Cyanosis, Ecchymosis, Hemosiderin Staining, Mottled, Pallor, Rubor,  Erythema. Periwound temperature was noted as No Abnormality. Wound #6 status is Open. Original cause of wound was Gradually Appeared. The wound is located on the Left Toe Second. The wound measures 0.3cm length x 0.2cm width x 0.1cm depth; 0.047cm^2 area and 0.005cm^3 volume. The wound is limited to skin breakdown. There is no tunneling or undermining noted. There is a medium amount of serous drainage noted. The wound margin is indistinct and nonvisible. There is large (67-100%) pink granulation within the wound bed. There is no necrotic tissue within the wound bed. The periwound skin appearance exhibited: Maceration. The periwound skin appearance did not exhibit: Callus, Crepitus, Excoriation, Induration, Rash, Scarring, Dry/Scaly, Atrophie Blanche, Cyanosis, Ecchymosis, Hemosiderin Staining, Mottled, Pallor, Rubor, Erythema. Periwound temperature was noted as No Abnormality. 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 ([id number]) Plan Wound Cleansing: Wound #16 Right,Plantar Toe Great: Clean wound with Normal Saline. Wound #17 Right,Distal,Plantar Metatarsal head second: Clean wound with Normal Saline. Anesthetic (add to Medication List): Wound #16 Right,Plantar Toe Great: Topical Lidocaine 4% cream applied to wound bed prior to debridement (In Clinic Only). Wound #17 Right,Distal,Plantar Metatarsal head second: Topical Lidocaine 4% cream applied to wound bed prior to  debridement (In Clinic Only). Primary Wound Dressing: Wound #16 Right,Plantar Toe Great: Silver Alginate - secure with tape Wound #17 Right,Distal,Plantar Metatarsal head second: Silver Alginate - secure with tape Dressing Change Frequency: Wound #16 Right,Plantar Toe Great: Change dressing every day. Wound #17 Right,Distal,Plantar Metatarsal head second: Change dressing every day. Change dressing every day. Follow-up Appointments: Wound #16 Right,Plantar Toe Great: Return Appointment in 2 weeks. Wound #17 Right,Distal,Plantar Metatarsal head second: Return Appointment in 2 weeks. Return Appointment in 2 weeks. Edema Control: Wound #16 Right,Plantar Toe Great: Patient to wear own compression stockings Wound #17 Right,Distal,Plantar Metatarsal head second: Patient to wear own compression stockings Additional Orders / Instructions: Wound #16 Right,Plantar Toe Great: Stop Smoking Increase protein intake. Wound #17 Right,Distal,Plantar Metatarsal head second: Stop Smoking Increase protein intake. My suggestion currently is gonna be that we continue with the above wound care measures for the next week. Patient is in agreement with plan. We will subsequently see were things stand at follow-up. He knows that he is [name] call the [employer] today. Please see above for specific wound care orders. We will see patient for re-evaluation in 2 week(s) here in the clinic. If anything worsens or changes patient will contact our office for additional recommendations. AMBERS, IYENGAR ([id number]) Electronic Signature(s) Signed: 05/08/2018 9:52:35 PM By: Worthy Keeler PA-C Entered By: Worthy Keeler on 05/08/2018 09:13:46 [name], [name] ([id number]) -------------------------------------------------------------------------------- ROS/PFSH Details Patient Name: [name], [name] Date of Service: 05/08/2018 8:30 AM Medical Record Number: [id number] Patient Account Number:  [id number] Date of Birth/Sex: [birth year] (68 y.o. M) Treating RN: Harold Barban Primary Care Provider: Lamonte Sakai Other  Clinician: Referring Provider: Lamonte Sakai Treating Provider/Extender: Melburn Hake, HOYT Weeks in Treatment: 77 Information Obtained From Patient Wound History Do you currently have one or more open woundso Yes How many open wounds do you currently haveo 6 Approximately how long have you had your woundso 3 months How have you been treating your wound(s) until nowo ointment and bandage Has your wound(s) ever healed and then re-openedo No Have you had any lab work done in the past montho No Have you tested positive for an antibiotic resistant organism (MRSA, VRE)o No Have you tested positive for osteomyelitis (bone infection)o No Have you had any tests for circulation on your legso Yes Who ordered the testo PCP Where was the test doneo [facility] Constitutional Symptoms (General Health) Complaints and Symptoms: Negative for: Fever; Chills Eyes Medical History: Negative for: Cataracts; Glaucoma; Optic Neuritis Ear/Nose/Mouth/Throat Medical History: Negative for: Chronic sinus problems/congestion; Middle ear problems Hematologic/Lymphatic Medical History: Positive for: Anemia; Lymphedema Negative for: Hemophilia; Human Immunodeficiency Virus; Sickle Cell Disease Respiratory Complaints and Symptoms: No Complaints or Symptoms Medical History: Negative for: Aspiration; Asthma; Chronic Obstructive Pulmonary Disease (COPD); Pneumothorax; Sleep Apnea; Tuberculosis Cardiovascular Complaints and Symptoms: No Complaints or Symptoms Blecha, Katlin E. ([id number]) Medical History: Positive for: Congestive Heart Failure; Hypertension; Peripheral Venous Disease Negative for: Angina; Arrhythmia; Coronary Artery Disease; Deep Vein Thrombosis; Hypotension; Myocardial Infarction; Peripheral Arterial Disease; Phlebitis; Vasculitis Gastrointestinal Medical History: Negative for:  Cirrhosis ; Colitis; Crohnos; Hepatitis A; Hepatitis B; Hepatitis C Endocrine Medical History: Positive for: Type II Diabetes Treated with: Oral agents Blood sugar tested every day: Yes Tested : QD Genitourinary Medical History: Negative for: End Stage Renal Disease Immunological Medical History: Negative for: Lupus Erythematosus; Raynaudos; Scleroderma Integumentary (Skin) Medical History: Negative for: History of Burn; History of pressure wounds Musculoskeletal Medical History: Negative for: Gout; Rheumatoid Arthritis; Osteoarthritis; Osteomyelitis Neurologic Medical History: Positive for: Neuropathy Negative for: Dementia; Quadriplegia; Paraplegia; Seizure Disorder Oncologic Medical History: Past Medical History Notes: 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 ([id number]) Immunization Notes: up to date Implantable Devices Family and Social History Current every day smoker; Marital Status - Widowed; Alcohol Use: Daily - quit drinking about a week ago; Drug Use: No History; Caffeine Use: Moderate; Financial Concerns: No; Food, Clothing or Shelter Needs: No; Support System Lacking: No; Transportation Concerns: No; Advanced Directives: No; Patient does not want information on Advanced Directives Physician Affirmation I have reviewed and agree with the above information. Electronic Signature(s) Signed: 05/08/2018 9:52:35 PM By: Worthy Keeler PA-C Signed: 06/16/2018 10:53:34 AM By: Harold Barban Entered By: Worthy Keeler on 05/08/2018 09:13:08 Bewley, [name] ([id number]) -------------------------------------------------------------------------------- SuperBill Details Patient Name: Torosian, Eydan E. Date of Service: 05/08/2018 Medical Record Number: [id number] Patient Account Number:  [id number] Date of Birth/Sex: 10-Nov-1950 (68 y.o. 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 of skin E11.621 Type 2 diabetes mellitus with foot ulcer I87.323 Chronic venous hypertension (idiopathic) with inflammation of bilateral lower extremity I89.0 Lymphedema, not elsewhere classified F17.218 Nicotine dependence, cigarettes, 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 abuse 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, right ankle  and foot Facility Procedures CPT4 Code: [id number] Description: [phone] - WOUND CARE VISIT-LEV 3 EST PT Modifier: Quantity: 1 Physician Procedures CPT4 Code Description: [id number] - WC PHYS LEVEL 4 - EST PT ICD-10 Diagnosis Description 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

## 2018-06-16 NOTE — Progress Notes (Signed)
ANTWOIN, [name] ([id number]) Visit Report for 05/08/2018 Arrival Information Details Patient Name: [name], [name] Date of Service: 05/08/2018 8:30 AM Medical Record Number: [id number] Patient Account Number: [id number] Date of Birth/Sex: 1950-05-15 (68 y.o. M) Treating RN: Harold Barban Primary Care Greig Altergott: Lamonte Sakai Other Clinician: Referring Takiera Mayo: Lamonte Sakai Treating Riyanna Crutchley/Extender: Melburn Hake, HOYT Weeks in Treatment: 62 Visit Information History Since Last Visit Added or deleted any medications: No Patient Arrived: Ambulatory Any new allergies or adverse reactions: No Arrival Time: 08:43 Had a fall or experienced change in No Accompanied By: self activities of daily living that may affect Transfer Assistance: None risk of falls: Patient Identification Verified: Yes Signs or symptoms of abuse/neglect since last visito No Secondary Verification Process Completed: Yes Hospitalized since last visit: No Patient Requires Transmission-Based No Implantable device outside of the clinic excluding No Precautions: cellular tissue based products placed in the center Patient Has Alerts: Yes since last visit: Patient Alerts: DMII Has Dressing in Place as Prescribed: Yes Pain Present Now: No Electronic Signature(s) Signed: 05/08/2018 11:23:59 AM By: Lorine Bears RCP, RRT, CHT Entered By: Lorine Bears on 05/08/2018 08:44:27 Thal, [name] ([id number]) -------------------------------------------------------------------------------- Clinic Level of Care Assessment Details Patient Name: Hennes, Kalil E. Date of Service: 05/08/2018 8:30 AM Medical Record Number: [id number] Patient Account Number: [id number] Date of Birth/Sex: [birth year] (68 y.o. M) Treating RN: Harold Barban Primary Care Jezlyn Westerfield: Lamonte Sakai Other Clinician: Referring Francetta Ilg: Lamonte Sakai Treating Harnoor Reta/Extender: Melburn Hake, HOYT Weeks in Treatment: 51 Clinic Level of  Care Assessment Items TOOL 4 Quantity Score []  - Use when only an EandM is performed on FOLLOW-UP visit 0 ASSESSMENTS - Nursing Assessment / Reassessment X - Reassessment of Co-morbidities (includes updates in patient status) 1 10 X- 1 5 Reassessment of Adherence to Treatment Plan ASSESSMENTS - Wound and Skin Assessment / Reassessment X - Simple Wound Assessment / Reassessment - one wound 1 5 []  - 0 Complex Wound Assessment / Reassessment - multiple wounds []  - 0 Dermatologic / Skin Assessment (not related to wound area) ASSESSMENTS - Focused Assessment []  - Circumferential Edema Measurements - multi extremities 0 []  - 0 Nutritional Assessment / Counseling / Intervention []  - 0 Lower Extremity Assessment (monofilament, tuning fork, pulses) []  - 0 Peripheral Arterial Disease Assessment (using hand held doppler) ASSESSMENTS - Ostomy and/or Continence Assessment and Care []  - Incontinence Assessment and Management 0 []  - 0 Ostomy Care Assessment and Management (repouching, etc.) PROCESS - Coordination of Care X - Simple Patient / Family Education for ongoing care 1 15 []  - 0 Complex (extensive) Patient / Family Education for ongoing care X- 1 10 Staff obtains Programmer, systems, Records, Test Results / Process Orders []  - 0 Staff telephones HHA, Nursing Homes / Clarify orders / etc []  - 0 Routine Transfer to another Facility (non-emergent condition) []  - 0 Routine Hospital Admission (non-emergent condition) []  - 0 New Admissions / Biomedical engineer / Ordering NPWT, Apligraf, etc. []  - 0 Emergency Hospital Admission (emergent condition) X- 1 10 Simple Discharge Coordination [place], Ernestine E. ([id number]) []  - 0 Complex (extensive) Discharge Coordination PROCESS - Special Needs []  - Pediatric / Minor Patient Management 0 []  - 0 Isolation Patient Management []  - 0 Hearing / Language / Visual special needs []  - 0 Assessment of Community assistance (transportation, D/C  planning, etc.) []  - 0 Additional assistance / Altered mentation []  - 0 Support Surface(s) Assessment (bed, cushion, seat, etc.) INTERVENTIONS - Wound Cleansing / Measurement X - Simple Wound Cleansing - one  wound 1 5 []  - 0 Complex Wound Cleansing - multiple wounds X- 1 5 Wound Imaging (photographs - any number of wounds) []  - 0 Wound Tracing (instead of photographs) X- 1 5 Simple Wound Measurement - one wound []  - 0 Complex Wound Measurement - multiple wounds INTERVENTIONS - Wound Dressings X - Small Wound Dressing one or multiple wounds 1 10 []  - 0 Medium Wound Dressing one or multiple wounds []  - 0 Large Wound Dressing one or multiple wounds []  - 0 Application of Medications - topical []  - 0 Application of Medications - injection INTERVENTIONS - Miscellaneous []  - External ear exam 0 []  - 0 Specimen Collection (cultures, biopsies, blood, body fluids, etc.) []  - 0 Specimen(s) / Culture(s) sent or taken to Lab for analysis []  - 0 Patient Transfer (multiple staff / Civil Service fast streamer / Similar devices) []  - 0 Simple Staple / Suture removal (25 or less) []  - 0 Complex Staple / Suture removal (26 or more) []  - 0 Hypo / Hyperglycemic Management (close monitor of Blood Glucose) []  - 0 Ankle / Brachial Index (ABI) - do not check if billed separately X- 1 5 Vital Signs Vancleve, Jahir E. ([id number]) Has the patient been seen at the hospital within the last three years: Yes Total Score: 85 Level Of Care: New/Established - Level 3 Electronic Signature(s) Signed: 06/16/2018 10:53:34 AM By: Harold Barban Entered By: Harold Barban on 05/08/2018 09:13:18 Minner, Erek EMarland Kitchen ([id number]) -------------------------------------------------------------------------------- Lower Extremity Assessment Details Patient Name: Sellen, Kevontae E. Date of Service: 05/08/2018 8:30 AM Medical Record Number: [id number] Patient Account Number: [id number] Date of Birth/Sex: 20-Sep-1950 (68 y.o. M) Treating  RN: Montey Hora Primary Care Luticia Tadros: Lamonte Sakai Other Clinician: Referring Kyian Obst: Lamonte Sakai Treating Lenzy Kerschner/Extender: Melburn Hake, HOYT Weeks in Treatment: 80 Vascular Assessment Pulses: Dorsalis Pedis Palpable: [Left:Yes] [Right:Yes] Posterior Tibial Extremity colors, hair growth, and conditions: Extremity Color: [Left:Hyperpigmented] [Right:Hyperpigmented] Hair Growth on Extremity: [Left:No] [Right:No] Temperature of Extremity: [Left:Warm] [Right:Warm] Capillary Refill: [Left:< 3 seconds] [Right:< 3 seconds] Electronic Signature(s) Signed: 05/08/2018 5:03:44 PM By: Montey Hora Entered By: Montey Hora on 05/08/2018 08:58:12 Vorce, Armend E. ([id number]) -------------------------------------------------------------------------------- Multi Wound Chart Details Patient Name: Farewell, Skiler E. Date of Service: 05/08/2018 8:30 AM Medical Record Number: [id number] Patient Account Number: [id number] Date of Birth/Sex: Sep 21, 1950 (68 y.o. 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] [N/A:N/A] Comorbid History: [16:Anemia, Lymphedema, Congestive Heart Failure, Hypertension, Peripheral Venous Disease, Type II Diabetes, Neuropathy] [17:Anemia, Lymphedema, Congestive Heart Failure, Hypertension, Peripheral Venous Disease, Type II Diabetes,   Neuropathy] [N/A:N/A] Date Acquired: [16:10/29/2017] [17:02/03/2018] [N/A:N/A] Weeks of Treatment: [16:27] [17:13] [N/A:N/A] Wound Status: [16:Open] [17:Open] [N/A:N/A] Measurements L x W x D [16:1.1x1.7x0.1] [17:1.4x0.8x0.1] [N/A:N/A] (cm) Area (cm) : [16:1.469] [17:0.88] [N/A:N/A] Volume (cm) : [16:0.147] [17:0.088] [N/A:N/A] % Reduction in Area: [16:8.30%] [17:-149.30%] [N/A:N/A] % Reduction in Volume: [16:8.10%] [17:-151.40%] [N/A:N/A] Classification: [16:Grade 1] [17:Grade 1] [N/A:N/A] Exudate Amount: [16:Medium] [17:Medium] [N/A:N/A] Exudate Type: [16:Serous] [17:Serous] [N/A:N/A] Exudate Color: [16:amber] [17:amber] [N/A:N/A] Wound Margin: [16:Flat and Intact] [17:Flat and Intact] [N/A:N/A] Granulation Amount: [16:Large (67-100%)] [17:Large (67-100%)] [  N/A:N/A] Granulation Quality: [16:Pink] [17:Pink] [N/A:N/A] Necrotic Amount: [16:Small (1-33%)] [17:Small (1-33%)] [N/A:N/A] Exposed Structures: [16:Fat Layer (Subcutaneous Tissue) Exposed: Yes Fascia: No Tendon: No Muscle: No Joint: No Bone: No] [17:Fat Layer (Subcutaneous Tissue) Exposed: Yes Fascia: No Tendon: No Muscle: No Joint: No Bone: No] [N/A:N/A] Epithelialization: [16:None] [17:None] [N/A:N/A] Periwound Skin Texture: [N/A:N/A] Excoriation: No Excoriation: No Induration: No Induration: No Callus: No Callus: No Crepitus: No Crepitus: No Rash: No Rash: No Scarring: No Scarring: No Periwound Skin Moisture: Maceration: Yes Maceration: Yes N/A Dry/Scaly: No Dry/Scaly: No Periwound Skin Color: Atrophie Blanche: No Atrophie Blanche: No N/A Cyanosis: No Cyanosis: No Ecchymosis: No Ecchymosis: No Erythema: No Erythema: No Hemosiderin Staining: No Hemosiderin Staining: No Mottled: No Mottled: No Pallor: No Pallor: No Rubor: No Rubor: No Temperature: No Abnormality No Abnormality N/A Tenderness on Palpation: No No N/A Wound Preparation: Ulcer Cleansing: Ulcer Cleansing: N/A Rinsed/Irrigated with  Saline Rinsed/Irrigated with Saline Topical Anesthetic Applied: Topical Anesthetic Applied: Other: lidocaine 4% Other: lidocaine 4% Treatment Notes Electronic Signature(s) Signed: 06/16/2018 10:53:34 AM By: Harold Barban Entered By: Harold Barban on 05/08/2018 08:54:24 Dimare, [name] ([id number]) -------------------------------------------------------------------------------- Tranquillity Details Patient Name: Tobler, Vadim E. Date of Service: 05/08/2018 8:30 AM Medical Record Number: [id number] Patient Account Number: [id number] Date of Birth/Sex: [birth year] (68 y.o. M) Treating RN: Harold Barban Primary Care Nickie Deren: Lamonte Sakai Other Clinician: Referring Deaveon Schoen: Lamonte Sakai Treating Riley Papin/Extender: Melburn Hake, HOYT Weeks in Treatment: 92 Active Inactive Abuse / Safety / Falls / Self Care Management Nursing Diagnoses: Potential for falls Goals: Patient will remain injury free related to falls Date Initiated: 09/21/2016 Target Resolution Date: 09/14/2017 Goal Status: Active Interventions: Assess fall risk on admission and as needed Notes: Nutrition Nursing Diagnoses: Potential for alteratiion in Nutrition/Potential for imbalanced nutrition Goals: Patient/caregiver agrees to and verbalizes understanding of need to use nutritional supplements and/or vitamins as prescribed Date Initiated: 09/21/2016 Target Resolution Date: 09/14/2017 Goal Status: Active Interventions: Assess patient nutrition upon admission and as needed per policy Notes: Orientation to the Wound Care Program Nursing Diagnoses: Knowledge deficit related to the wound healing center program Goals: Patient/caregiver will verbalize understanding of the [facility] Date Initiated: 09/21/2016 Target Resolution Date: 06/15/2017 Goal Status: Active Interventions: Provide education on orientation to the wound center Harr, Jobe E. ([id number]) Notes: Wound/Skin  Impairment Nursing Diagnoses: Knowledge deficit related to smoking impact on wound healing Goals: Ulcer/skin breakdown will have a volume reduction of 30% by week 4 Date Initiated: 09/21/2016 Target Resolution Date: 08/17/2017 Goal Status: Active Ulcer/skin breakdown will have a volume reduction of 50% by week 8 Date Initiated: 09/21/2016 Target Resolution Date: 08/17/2017 Goal Status: Active Ulcer/skin breakdown will have a volume reduction of 80% by week 12 Date Initiated: 09/21/2016 Target Resolution Date: 09/14/2017 Goal Status: Active Ulcer/skin breakdown will heal within 14 weeks Date Initiated: 09/21/2016 Target Resolution Date: 08/17/2017 Goal Status: Active Interventions: Assess patient/caregiver ability to obtain necessary supplies Assess patient/caregiver ability to perform ulcer/skin care regimen upon admission and as needed Assess ulceration(s) every visit Notes: Electronic Signature(s) Signed: 06/16/2018 10:53:34 AM By: Harold Barban Entered By: Harold Barban on 05/08/2018 08:54:14 Tupou, Reynol E. ([id number]) -------------------------------------------------------------------------------- Pain Assessment Details Patient Name: Muscarella, Raydon E. Date of Service: 05/08/2018 8:30 AM Medical Record Number: [id number] Patient Account Number: [id number] Date of Birth/Sex: 1950/11/08 (68 y.o. M) Treating RN: Harold Barban Primary Care Ciji Boston: Lamonte Sakai Other Clinician: Referring Harrol Novello: Lamonte Sakai Treating Emileo Semel/Extender: Melburn Hake, HOYT Weeks in Treatment: 52 Active Problems Location of Pain Severity and Description  of Pain Patient Has Paino No Site Locations Pain Management and Medication Current Pain Management: Electronic Signature(s) Signed: 05/08/2018 11:23:59 AM By: Lorine Bears RCP, RRT, CHT Signed: 06/16/2018 10:53:34 AM By: Harold Barban Entered By: Lorine Bears on 05/08/2018 08:44:34 Wattenbarger, [name]  ([id number]) -------------------------------------------------------------------------------- Patient/Caregiver Education Details Patient Name: Moren, Maritza E. Date of Service: 05/08/2018 8:30 AM Medical Record Number: [id number] Patient Account Number: [id number] Date of Birth/Gender: 1950/07/27 (68 y.o. M) Treating RN: Harold Barban Primary Care Physician: Lamonte Sakai Other Clinician: Referring Physician: Lamonte Sakai Treating Physician/Extender: Sharalyn Ink in Treatment: 37 Education Assessment Education Provided To: Patient Education Topics Provided Wound/Skin Impairment: Handouts: Caring for Your Ulcer Methods: Demonstration, Explain/Verbal Responses: State content correctly Electronic Signature(s) Signed: 06/16/2018 10:53:34 AM By: Harold Barban Entered By: Harold Barban on 05/08/2018 08:54:38 Hook, Madsen EMarland Kitchen ([id number]) -------------------------------------------------------------------------------- Wound Assessment Details Patient Name: Hsiao, Camil E. Date of Service: 05/08/2018 8:30 AM Medical Record Number: [id number] Patient Account Number: [id number] Date of Birth/Sex: [birth year] (68 y.o. M) Treating RN: Montey Hora Primary Care Dai Mcadams: Lamonte Sakai Other Clinician: Referring Deshanae Lindo: Lamonte Sakai Treating Bonnie Overdorf/Extender: Melburn Hake, HOYT Weeks in Treatment: 26 Wound Status Wound Number: 16 Primary Diabetic Wound/Ulcer of the Lower Extremity Etiology: Wound Location: Right Toe Great - Plantar Wound Open Wounding Event: Not Known Status: Date Acquired: 10/29/2017 Comorbid Anemia, Lymphedema, Congestive Heart Weeks Of Treatment: 27 History: Failure, Hypertension, Peripheral Venous Clustered Wound: No Disease, Type II Diabetes, Neuropathy Photos Photo Uploaded By: Montey Hora on 05/08/2018 09:37:02 Wound Measurements Length: (cm) 1.1 Width: (cm) 1.7 Depth: (cm) 0.1 Area: (cm) 1.469 Volume: (cm) 0.147 % Reduction in Area: 8.3% %  Reduction in Volume: 8.1% Epithelialization: None Tunneling: No Undermining: No Wound Description Classification: Grade 1 Wound Margin: Flat and Intact Exudate Amount: Medium Exudate Type: Serous Exudate Color: amber Foul Odor After Cleansing: No Slough/Fibrino Yes Wound Bed Granulation Amount: Large (67-100%) Exposed Structure Granulation Quality: Pink Fascia Exposed: No Necrotic Amount: Small (1-33%) Fat Layer (Subcutaneous Tissue) Exposed: Yes Necrotic Quality: Adherent Slough Tendon Exposed: No Muscle Exposed: No Joint Exposed: No Bone Exposed: No Periwound Skin Texture Pardo, Cresencio E. ([id number]) Texture Color No Abnormalities Noted: No No Abnormalities Noted: No Callus: No Atrophie Blanche: No Crepitus: No Cyanosis: No Excoriation: No Ecchymosis: No Induration: No Erythema: No Rash: No Hemosiderin Staining: No Scarring: No Mottled: No Pallor: No Moisture Rubor: No No Abnormalities Noted: No Dry / Scaly: No Temperature / Pain Maceration: Yes Temperature: No Abnormality Wound Preparation Ulcer Cleansing: Rinsed/Irrigated with Saline Topical Anesthetic Applied: Other: lidocaine 4%, Electronic Signature(s) Signed: 05/08/2018 5:03:44 PM By: Montey Hora Entered By: Montey Hora on 05/08/2018 08:52:01 Breidenbach, [name] ([id number]) -------------------------------------------------------------------------------- Wound Assessment Details Patient Name: Secord, Loel E. Date of Service: 05/08/2018 8:30 AM Medical Record Number: [id number] Patient Account Number: [id number] Date of Birth/Sex: 1950/06/19 (68 y.o. M) Treating RN: Montey Hora Primary Care Latorya Bautch: Lamonte Sakai Other Clinician: Referring Rashika Bettes: Lamonte Sakai Treating Vondra Aldredge/Extender: Melburn Hake, HOYT Weeks in Treatment: 60 Wound Status Wound Number: 17 Primary Diabetic Wound/Ulcer of the Lower Extremity Etiology: Wound Location: Right Metatarsal head second - Plantar, Distal Wound  Open Status: Wounding Event: Trauma Comorbid Anemia, Lymphedema, Congestive Heart Date Acquired: 02/03/2018 History: Failure, Hypertension, Peripheral Venous Weeks Of Treatment: 13 Disease, Type II Diabetes, Neuropathy Clustered Wound: No Photos Photo Uploaded By: Montey Hora on 05/08/2018 09:37:31 Wound Measurements Length: (cm) 1.4 Width: (cm) 0.8 Depth: (cm) 0.1 Area: (cm) 0.88 Volume: (cm) 0.088 % Reduction in Area: -149.3% % Reduction in Volume: -  151.4% Epithelialization: None Tunneling: No Undermining: No Wound Description Classification: Grade 1 Wound Margin: Flat and Intact Exudate Amount: Medium Exudate Type: Serous Exudate Color: amber Foul Odor After Cleansing: No Slough/Fibrino Yes Wound Bed Granulation Amount: Large (67-100%) Exposed Structure Granulation Quality: Pink Fascia Exposed: No Necrotic Amount: Small (1-33%) Fat Layer (Subcutaneous Tissue) Exposed: Yes Necrotic Quality: Adherent Slough Tendon Exposed: No Muscle Exposed: No Joint Exposed: No Bone Exposed: No Periwound Skin Texture Pfeifer, Kirklin E. ([id number]) Texture Color No Abnormalities Noted: No No Abnormalities Noted: No Callus: No Atrophie Blanche: No Crepitus: No Cyanosis: No Excoriation: No Ecchymosis: No Induration: No Erythema: No Rash: No Hemosiderin Staining: No Scarring: No Mottled: No Pallor: No Moisture Rubor: No No Abnormalities Noted: No Dry / Scaly: No Temperature / Pain Maceration: Yes Temperature: No Abnormality Wound Preparation Ulcer Cleansing: Rinsed/Irrigated with Saline Topical Anesthetic Applied: Other: lidocaine 4%, Electronic Signature(s) Signed: 05/08/2018 5:03:44 PM By: Montey Hora Entered By: Montey Hora on 05/08/2018 08:52:44 Iten, Anterio EMarland Kitchen ([id number]) -------------------------------------------------------------------------------- Wound Assessment Details Patient Name: Yohe, Tranell E. Date of Service: 05/08/2018 8:30  AM Medical Record Number: [id number] Patient Account Number: [id number] Date of Birth/Sex: 11-15-1950 (68 y.o. M) Treating RN: Montey Hora Primary Care Dynasti Kerman: Lamonte Sakai Other Clinician: Referring Yazan Gatling: Lamonte Sakai Treating Sari Cogan/Extender: Melburn Hake, HOYT Weeks in Treatment: 67 Wound Status Wound Number: 2 Primary Diabetic Wound/Ulcer of the Lower Extremity Etiology: Wound Location: Right Toe Second Wound Open Wounding Event: Gradually Appeared Status: Date Acquired: 06/11/2016 Comorbid Anemia, Lymphedema, Congestive Heart Weeks Of Treatment: 84 History: Failure, Hypertension, Peripheral Venous Clustered Wound: No Disease, Type II Diabetes, Neuropathy Pending Amputation On Presentation Photos Photo Uploaded By: Montey Hora on 05/08/2018 09:37:31 Wound Measurements Length: (cm) 0.5 Width: (cm) 1.3 Depth: (cm) 0.1 Area: (cm) 0.511 Volume: (cm) 0.051 % Reduction in Area: 83.4% % Reduction in Volume: 83.4% Epithelialization: Large (67-100%) Tunneling: No Undermining: No Wound Description Classification: Grade 2 Wound Margin: Indistinct, nonvisible Exudate Amount: Medium Exudate Type: Serous Exudate Color: amber Foul Odor After Cleansing: No Slough/Fibrino No Wound Bed Granulation Amount: Large (67-100%) Exposed Structure Granulation Quality: Pink Fascia Exposed: No Necrotic Amount: None Present (0%) Fat Layer (Subcutaneous Tissue) Exposed: No Tendon Exposed: No Muscle Exposed: No Joint Exposed: No Bone Exposed: No Limited to Skin Breakdown Kwiecinski, Selig E. ([id number]) Periwound Skin Texture Texture Color No Abnormalities Noted: No No Abnormalities Noted: No Callus: No Atrophie Blanche: No Crepitus: No Cyanosis: No Excoriation: No Ecchymosis: No Induration: No Erythema: No Rash: No Hemosiderin Staining: No Scarring: No Mottled: No Pallor: No Moisture Rubor: No No Abnormalities Noted: No Dry / Scaly: No Temperature /  Pain Maceration: No Temperature: No Abnormality Wound Preparation Ulcer Cleansing: Rinsed/Irrigated with Saline Topical Anesthetic Applied: None Electronic Signature(s) Signed: 05/08/2018 5:03:44 PM By: Montey Hora Entered By: Montey Hora on 05/08/2018 08:54:50 Waring, Cleston EMarland Kitchen ([id number]) -------------------------------------------------------------------------------- Wound Assessment Details Patient Name: Troutman, Egon E. Date of Service: 05/08/2018 8:30 AM Medical Record Number: [id number] Patient Account Number: [id number] Date of Birth/Sex: [birth year] (68 y.o. M) Treating RN: Montey Hora Primary Care Calyx Hawker: Lamonte Sakai Other Clinician: Referring Braheem Tomasik: Lamonte Sakai Treating Danney Bungert/Extender: Melburn Hake, HOYT Weeks in Treatment: 33 Wound Status Wound Number: 6 Primary Diabetic Wound/Ulcer of the Lower Extremity Etiology: Wound Location: Left Toe Second Wound Open Wounding Event: Gradually Appeared Status: Date Acquired: 06/11/2016 Comorbid Anemia, Lymphedema, Congestive Heart Weeks Of Treatment: 84 History: Failure, Hypertension, Peripheral Venous Clustered Wound: No Disease, Type II Diabetes, Neuropathy Pending Amputation On Presentation Photos Photo Uploaded By: Montey Hora on 05/08/2018  09:37:48 Wound Measurements Length: (cm) 0.3 Width: (cm) 0.2 Depth: (cm) 0.1 Area: (cm) 0.047 Volume: (cm) 0.005 % Reduction in Area: 97.8% % Reduction in Volume: 97.7% Epithelialization: Small (1-33%) Tunneling: No Undermining: No Wound Description Classification: Grade 2 Wound Margin: Indistinct, nonvisible Exudate Amount: Medium Exudate Type: Serous Exudate Color: amber Foul Odor After Cleansing: No Slough/Fibrino No Wound Bed Granulation Amount: Large (67-100%) Exposed Structure Granulation Quality: Pink Fascia Exposed: No Necrotic Amount: None Present (0%) Fat Layer (Subcutaneous Tissue) Exposed: No Tendon Exposed: No Muscle Exposed: No Joint  Exposed: No Bone Exposed: No Limited to Skin Breakdown Wieczorek, Khali E. ([id number]) Periwound Skin Texture Texture Color No Abnormalities Noted: No No Abnormalities Noted: No Callus: No Atrophie Blanche: No Crepitus: No Cyanosis: No Excoriation: No Ecchymosis: No Induration: No Erythema: No Rash: No Hemosiderin Staining: No Scarring: No Mottled: No Pallor: No Moisture Rubor: No No Abnormalities Noted: No Dry / Scaly: No Temperature / Pain Maceration: Yes Temperature: No Abnormality Wound Preparation Ulcer Cleansing: Rinsed/Irrigated with Saline Topical Anesthetic Applied: None Electronic Signature(s) Signed: 05/08/2018 5:03:44 PM By: Montey Hora Entered By: Montey Hora on 05/08/2018 08:56:27 Heyer, Ayad E. ([id number]) -------------------------------------------------------------------------------- Vitals Details Patient Name: Toops, Rhodes E. Date of Service: 05/08/2018 8:30 AM Medical Record Number: [id number] Patient Account Number: [id number] Date of Birth/Sex: 04/06/1951 (68 y.o. M) Treating RN: Harold Barban Primary Care Lejla Moeser: Lamonte Sakai Other Clinician: Referring Naseem Adler: Lamonte Sakai Treating Jan Walters/Extender: Melburn Hake, HOYT Weeks in Treatment: 74 Vital Signs Time Taken: 08:44 Temperature (F): 98.2 Height (in): 69 Pulse (bpm): 87 Weight (lbs): 168 Respiratory Rate (breaths/min): 16 Body Mass Index (BMI): 24.8 Blood Pressure (mmHg): 168/79 Reference Range: 80 - 120 mg / dl Electronic Signature(s) Signed: 05/08/2018 11:23:59 AM By: Lorine Bears RCP, RRT, CHT Entered By: Lorine Bears on 05/08/2018 08:47:34

## 2018-06-19 ENCOUNTER — Other Ambulatory Visit: Payer: Self-pay

## 2018-06-19 ENCOUNTER — Encounter: Payer: Medicare Other | Attending: Physician Assistant | Admitting: Physician Assistant

## 2018-06-19 DIAGNOSIS — F17210 Nicotine dependence, cigarettes, uncomplicated: Secondary | ICD-10-CM | POA: Insufficient documentation

## 2018-06-19 DIAGNOSIS — Z923 Personal history of irradiation: Secondary | ICD-10-CM | POA: Diagnosis not present

## 2018-06-19 DIAGNOSIS — I890 Lymphedema, not elsewhere classified: Secondary | ICD-10-CM | POA: Insufficient documentation

## 2018-06-19 DIAGNOSIS — I130 Hypertensive heart and chronic kidney disease with heart failure and stage 1 through stage 4 chronic kidney disease, or unspecified chronic kidney disease: Secondary | ICD-10-CM | POA: Insufficient documentation

## 2018-06-19 DIAGNOSIS — E1122 Type 2 diabetes mellitus with diabetic chronic kidney disease: Secondary | ICD-10-CM | POA: Insufficient documentation

## 2018-06-19 DIAGNOSIS — E1140 Type 2 diabetes mellitus with diabetic neuropathy, unspecified: Secondary | ICD-10-CM | POA: Diagnosis not present

## 2018-06-19 DIAGNOSIS — Z7984 Long term (current) use of oral hypoglycemic drugs: Secondary | ICD-10-CM | POA: Insufficient documentation

## 2018-06-19 DIAGNOSIS — L97522 Non-pressure chronic ulcer of other part of left foot with fat layer exposed: Secondary | ICD-10-CM | POA: Diagnosis not present

## 2018-06-19 DIAGNOSIS — L97512 Non-pressure chronic ulcer of other part of right foot with fat layer exposed: Secondary | ICD-10-CM | POA: Diagnosis not present

## 2018-06-19 DIAGNOSIS — Z85828 Personal history of other malignant neoplasm of skin: Secondary | ICD-10-CM | POA: Diagnosis not present

## 2018-06-19 DIAGNOSIS — D631 Anemia in chronic kidney disease: Secondary | ICD-10-CM | POA: Insufficient documentation

## 2018-06-19 DIAGNOSIS — E1169 Type 2 diabetes mellitus with other specified complication: Secondary | ICD-10-CM | POA: Diagnosis not present

## 2018-06-19 DIAGNOSIS — M86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, right ankle and foot: Secondary | ICD-10-CM | POA: Insufficient documentation

## 2018-06-19 DIAGNOSIS — Z9119 Patient's noncompliance with other medical treatment and regimen: Secondary | ICD-10-CM | POA: Insufficient documentation

## 2018-06-19 DIAGNOSIS — K7460 Unspecified cirrhosis of liver: Secondary | ICD-10-CM | POA: Insufficient documentation

## 2018-06-19 DIAGNOSIS — I509 Heart failure, unspecified: Secondary | ICD-10-CM | POA: Insufficient documentation

## 2018-06-19 DIAGNOSIS — N189 Chronic kidney disease, unspecified: Secondary | ICD-10-CM | POA: Diagnosis not present

## 2018-06-19 DIAGNOSIS — E11621 Type 2 diabetes mellitus with foot ulcer: Secondary | ICD-10-CM | POA: Diagnosis not present

## 2018-06-19 DIAGNOSIS — F1010 Alcohol abuse, uncomplicated: Secondary | ICD-10-CM | POA: Diagnosis not present

## 2018-06-20 ENCOUNTER — Inpatient Hospital Stay: Payer: Medicare Other

## 2018-06-20 ENCOUNTER — Other Ambulatory Visit: Payer: Self-pay | Admitting: Oncology

## 2018-06-20 ENCOUNTER — Encounter: Payer: Self-pay | Admitting: Oncology

## 2018-06-20 ENCOUNTER — Other Ambulatory Visit: Payer: Self-pay | Admitting: *Deleted

## 2018-06-20 ENCOUNTER — Inpatient Hospital Stay: Payer: Medicare Other | Attending: Oncology | Admitting: Oncology

## 2018-06-20 ENCOUNTER — Other Ambulatory Visit: Payer: Self-pay

## 2018-06-20 VITALS — BP 158/87 | HR 65 | Temp 97.1°F | Resp 18 | Ht 72.0 in | Wt 180.4 lb

## 2018-06-20 DIAGNOSIS — K7460 Unspecified cirrhosis of liver: Secondary | ICD-10-CM | POA: Diagnosis not present

## 2018-06-20 DIAGNOSIS — D649 Anemia, unspecified: Secondary | ICD-10-CM

## 2018-06-20 DIAGNOSIS — J90 Pleural effusion, not elsewhere classified: Secondary | ICD-10-CM | POA: Insufficient documentation

## 2018-06-20 DIAGNOSIS — D472 Monoclonal gammopathy: Secondary | ICD-10-CM | POA: Diagnosis not present

## 2018-06-20 DIAGNOSIS — R188 Other ascites: Secondary | ICD-10-CM | POA: Diagnosis not present

## 2018-06-20 DIAGNOSIS — Z79899 Other long term (current) drug therapy: Secondary | ICD-10-CM

## 2018-06-20 DIAGNOSIS — J449 Chronic obstructive pulmonary disease, unspecified: Secondary | ICD-10-CM | POA: Diagnosis not present

## 2018-06-20 DIAGNOSIS — Z862 Personal history of diseases of the blood and blood-forming organs and certain disorders involving the immune mechanism: Secondary | ICD-10-CM

## 2018-06-20 DIAGNOSIS — N189 Chronic kidney disease, unspecified: Principal | ICD-10-CM

## 2018-06-20 DIAGNOSIS — D509 Iron deficiency anemia, unspecified: Secondary | ICD-10-CM

## 2018-06-20 DIAGNOSIS — D631 Anemia in chronic kidney disease: Secondary | ICD-10-CM | POA: Diagnosis not present

## 2018-06-20 DIAGNOSIS — I509 Heart failure, unspecified: Secondary | ICD-10-CM | POA: Diagnosis not present

## 2018-06-20 DIAGNOSIS — E1122 Type 2 diabetes mellitus with diabetic chronic kidney disease: Secondary | ICD-10-CM

## 2018-06-20 DIAGNOSIS — I130 Hypertensive heart and chronic kidney disease with heart failure and stage 1 through stage 4 chronic kidney disease, or unspecified chronic kidney disease: Secondary | ICD-10-CM | POA: Insufficient documentation

## 2018-06-20 DIAGNOSIS — K219 Gastro-esophageal reflux disease without esophagitis: Secondary | ICD-10-CM | POA: Insufficient documentation

## 2018-06-20 DIAGNOSIS — F17210 Nicotine dependence, cigarettes, uncomplicated: Secondary | ICD-10-CM | POA: Diagnosis not present

## 2018-06-20 LAB — CBC
HCT: 21.2 % — ABNORMAL LOW (ref 39.0–52.0)
Hemoglobin: 6.9 g/dL — ABNORMAL LOW (ref 13.0–17.0)
MCH: 28.6 pg (ref 26.0–34.0)
MCHC: 32.5 g/dL (ref 30.0–36.0)
MCV: 88 fL (ref 80.0–100.0)
NRBC: 0 % (ref 0.0–0.2)
PLATELETS: 185 10*3/uL (ref 150–400)
RBC: 2.41 MIL/uL — ABNORMAL LOW (ref 4.22–5.81)
RDW: 13.3 % (ref 11.5–15.5)
WBC: 6.4 10*3/uL (ref 4.0–10.5)

## 2018-06-20 LAB — IRON AND TIBC
Iron: 23 ug/dL — ABNORMAL LOW (ref 45–182)
Saturation Ratios: 8 % — ABNORMAL LOW (ref 17.9–39.5)
TIBC: 306 ug/dL (ref 250–450)
UIBC: 283 ug/dL

## 2018-06-20 LAB — FERRITIN: Ferritin: 34 ng/mL (ref 24–336)

## 2018-06-20 LAB — PREPARE RBC (CROSSMATCH)

## 2018-06-20 MED ORDER — EPOETIN ALFA-EPBX 10000 UNIT/ML IJ SOLN
10000.0000 [IU] | Freq: Once | INTRAMUSCULAR | Status: AC
  Administered 2018-06-20: 10000 [IU] via SUBCUTANEOUS
  Filled 2018-06-20: qty 1

## 2018-06-20 MED ORDER — EPOETIN ALFA-EPBX 40000 UNIT/ML IJ SOLN
40000.0000 [IU] | Freq: Once | INTRAMUSCULAR | Status: AC
  Administered 2018-06-20: 40000 [IU] via SUBCUTANEOUS
  Filled 2018-06-20: qty 1

## 2018-06-20 MED ORDER — EPOETIN ALFA-EPBX 40000 UNIT/ML IJ SOLN: 50000.0000 [IU] | Freq: Once | INTRAMUSCULAR | Status: DC

## 2018-06-20 NOTE — Progress Notes (Signed)
Pt with no complaints today

## 2018-06-20 NOTE — Progress Notes (Signed)
Hematology/Oncology Consult note [facility]  [phone] [phone]  Patient Care Team: [name], MD as PCP - General (Internal Medicine) Sindy Guadeloupe, MD as Consulting Physician (Hematology and Oncology)   Name of the patient: [name]  [id number]  April 15, 1950   Date of visit: 06/20/18  Diagnosis- 1.Anemia- multifactorial- iron deficiency, chronic disease and anemia of kidney disease  2. 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. 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 Baruch Gouty. Patient had CT abdomen in July 2016 which showed retroperitoneal, celiac and gastric adenopathy. MR pelvis in Oct 2017 showed b/l inguinal and external iliac adenopathy as well. Inguinal LN biopsy was negative for malignancy.   2. Biopsy of the skin lesion showed: DIAGNOSIS:  A. PERIANAL POLYP, LEFT; EXCISION:  - SKIN WITH POLYPOID VASCULAR PROLIFERATION WITH SUPERFICIAL NECROSIS,  SEE COMMENT.   B. PERIANAL MASS, RIGHT; INCISIONAL BIOPSY:  - INVASIVE CARCINOMA WITH BASALOID FEATURES.  - SEE COMMENT.   Comment #1:  The differential diagnosis for the perianal polyp includes pyogenic  granuloma and ulcerated hemangioma.   Comment #2:  Immunohistochemical stains were performed. The carcinoma is positive for  p16, BerEp4, and BCL-2. Stain controls worked appropriately.  Unfortunately the pattern of staining does not assist with further  classification of the carcinoma. Clinical correlation is required to  determine if the lesion involves perianal skin primarily (BCC) or is  arising in the anal canal (squamous cell carcinoma with basaloid  features).   This was followed by RT to the lesion.  3. He had repeat CT abdomen in feb  2018 which showed: IMPRESSION: Progressive hepatic cirrhosis since prior study. No evidence of hepatic neoplasm.  New moderate ascites, diffuse mesenteric and body wall edema, and small bilateral pleural effusions. Mild diffuse small bowel wall thickening, consistent with hypoalbuminemia.  Findings consistent with portal venous hypertension, including mild splenomegaly and upper abdominal venous collaterals.  Stable mild abdominal lymphadenopathy. Mild increase in bilateral iliac and inguinal lymphadenopathy in the pelvis, which may be reactive in etiology, with metastatic disease considered less Likely.  4. Heunderwentegd and colonoscopy for his microcytic anemiawhich showed no overt bleeding lesions or malignancy  5. CBC on 09/18/2016 showed H&H of 6.5/19.8 and MCV of 86.7. Labs suggestive of iron deficiency anemia. B12 and folate was within normal limits. Multiple myeloma panel showed IGG monoclonal protein of 0.9 gm. Polyclonal gammopathyHaptoglobin and no copper was normal. Reticulocyte count was mildly elevated at 7.9. Patient received 2 doses of ferriheme on 620 and 10/03/2016. He also received 1 unit of blood transfusion  6. EGD showed a normal duodenum and normal esophagus. Found to have gastritis which was biopsied. Also found to have a large polypoid mass in the left piriform sinus and nonobstructing the airway. This was subsequently evaluated by ENT which did not find any evidence of mass. The colonoscopy showed appendixthat was appearing protruding and ordered this blood with stool. No mucus seen in the vicinity. He was evaluated by a [facility] and has been considered for a repeat colonoscopy versus endoscopy mucosal resection of the mucocele  7.Bonemarrowbiopsy showed hypercellular marrow for age. 9% plasma cells. Clonality of cells could not be evaluated because of lack of clot sections available for staining. Cytogenetics  were normal. FISH studies were  negative for any chromosomal abnormalities as well.  8. Patient received procrit for anemia of chronic kidney disease with improvement of his anemia to 10  Interval history-he has not required any recent paracentesis the leg swelling has improved as well.  He has chronic fatigue but denies new complaints at this time  ECOG PS- 1 Pain scale- 0   Review of systems- Review of Systems  Constitutional: Positive for malaise/fatigue. Negative for chills, fever and weight loss.  HENT: Negative for congestion, ear discharge and nosebleeds.   Eyes: Negative for blurred vision.  Respiratory: Negative for cough, hemoptysis, sputum production, shortness of breath and wheezing.   Cardiovascular: Negative for chest pain, palpitations, orthopnea and claudication.  Gastrointestinal: Negative for abdominal pain, blood in stool, constipation, diarrhea, heartburn, melena, nausea and vomiting.  Genitourinary: Negative for dysuria, flank pain, frequency, hematuria and urgency.  Musculoskeletal: Negative for back pain, joint pain and myalgias.  Skin: Negative for rash.  Neurological: Negative for dizziness, tingling, focal weakness, seizures, weakness and headaches.  Endo/Heme/Allergies: Does not bruise/bleed easily.  Psychiatric/Behavioral: Negative for depression and suicidal ideas. The patient does not have insomnia.       No Known Allergies   Past Medical History:  Diagnosis Date   Anemia    Cancer ([place]) 11/02/2014   PERIANAL MASS, RIGHT; INCISIONAL BIOPSY: INVASIVE CARCINOMA WITH BASALOID FEATURES   CHF (congestive heart failure) (HCC)    Chronic kidney disease    COPD (chronic obstructive pulmonary disease) (HCC)    NO INHALERS   Diabetes mellitus    NO MEDS   GERD (gastroesophageal reflux disease)    Hepatic cirrhosis (HCC)    Hypertension    Leg swelling    Loose, teeth    PATIENT STATES "HAS 6 TEETH, SOME ARE LOOSE"   Neuromuscular disorder ([place])    RIGHT HAND AND  FINGERS NUMB   Shortness of breath dyspnea    WITH EXERTION    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 02/24/2016     Past Surgical History:  Procedure Laterality Date   APPENDECTOMY     COLONOSCOPY WITH PROPOFOL N/A 11/19/2014   Procedure: COLONOSCOPY WITH PROPOFOL;  Surgeon: Lucilla Lame, MD;  Location: [place];  Service: Endoscopy;  Laterality: N/A;  DIABETIC-ORAL MEDS   COLONOSCOPY WITH PROPOFOL N/A 10/04/2016   Procedure: COLONOSCOPY WITH PROPOFOL;  Surgeon: Jonathon Bellows, MD;  Location: [facility] ENDOSCOPY;  Service: Endoscopy;  Laterality: N/A;   ESOPHAGOGASTRODUODENOSCOPY (EGD) WITH PROPOFOL N/A 10/04/2016   Procedure: ESOPHAGOGASTRODUODENOSCOPY (EGD) WITH PROPOFOL;  Surgeon: Jonathon Bellows, MD;  Location: [facility] ENDOSCOPY;  Service: Endoscopy;  Laterality: N/A;   GIVENS CAPSULE STUDY N/A 11/13/2016   Procedure: GIVENS CAPSULE STUDY;  Surgeon: Jonathon Bellows, MD;  Location: [facility] ENDOSCOPY;  Service: Gastroenterology;  Laterality: N/A;   INCISION AND DRAINAGE PERIRECTAL ABSCESS Right 12/16/2017   Procedure: EXCISION BASAL CELL CANCER/ GLUTEAL ABSCESS AND FLAP COVERAGE;  Surgeon: Robert Bellow, MD;  Location: [facility] ORS;  Service: General;  Laterality: Right;   INGUINAL LYMPH NODE BIOPSY Right 01/26/2016   Procedure: INGUINAL LYMPH NODE BIOPSY;  Surgeon: Clayburn Pert, MD;  Location: [facility] ORS;  Service: General;  Laterality: Right;   RECTAL BIOPSY N/A 11/02/2014   Procedure: BIOPSY RECTAL;  Surgeon: Marlyce Huge, MD;  Location: [facility];  Service: General;  Laterality: N/A;   RECTAL EXAM UNDER ANESTHESIA N/A 11/02/2014   Procedure: RECTAL EXAM UNDER ANESTHESIA;  Surgeon: Marlyce Huge, MD;  Location: [facility] ORS;  Service:  General;  Laterality: N/A;   TONSILLECTOMY      Social History   Socioeconomic History   Marital status: Divorced    Spouse name: Not on file   Number of children: Not on file   Years of education: Not on file   Highest education  level: Not on file  Occupational History   Not on file  Social Needs   Financial resource strain: Somewhat hard   Food insecurity:    Worry: Patient refused    Inability: Patient refused   Transportation needs:    Medical: Patient refused    Non-medical: Patient refused  Tobacco Use   Smoking status: Current Every Day Smoker    Packs/day: 0.25    Years: 20.00    Pack years: 5.00    Types: Cigarettes   Smokeless tobacco: Never Used   Tobacco comment: 3-4 cigarettes per day  Substance and Sexual Activity   Alcohol use: Not Currently   Drug use: Not Currently    Comment: Last Use 2016 per patient-+ UDS FOR COCAINE IN 2014   Sexual activity: Never  Lifestyle   Physical activity:    Days per week: 0 days    Minutes per session: 0 min   Stress: To some extent  Relationships   Social connections:    Talks on phone: More than three times a week    Gets together: More than three times a week    Attends religious service: More than 4 times per year    Active member of club or organization: Yes    Attends meetings of clubs or organizations: More than 4 times per year    Relationship status: Divorced   Intimate partner violence:    Fear of current or ex partner: Patient refused    Emotionally abused: Patient refused    Physically abused: Patient refused    Forced sexual activity: Patient refused  Other Topics Concern   Not on file  Social History Narrative   Not on file    Family History  Problem Relation Age of Onset   Asthma Mother    Colon cancer Neg Hx      Current Outpatient Medications:    amLODipine (NORVASC) 10 MG tablet, Take 10 mg by mouth 2 (two) times daily. , Disp: , Rfl:    atorvastatin (LIPITOR) 40 MG tablet, Take 40 mg by mouth every morning. , Disp: , Rfl:    docusate sodium (COLACE) 100 MG capsule, Take 1 capsule (100 mg total) by mouth 2 (two) times daily., Disp: 10 capsule, Rfl: 0   ferrous sulfate 325 (65 FE) MG tablet, Take  325 mg by mouth daily. , Disp: , Rfl:    hydrALAZINE (APRESOLINE) 25 MG tablet, Take 25 mg 2 (two) times daily by mouth., Disp: , Rfl:    omeprazole (PRILOSEC) 40 MG capsule, Take 40 mg by mouth every morning. , Disp: , Rfl:    polyethylene glycol (MIRALAX / GLYCOLAX) packet, Take 17 g by mouth daily as needed for mild constipation. , Disp: , Rfl:    sodium bicarbonate 650 MG tablet, Take 1 tablet (650 mg total) by mouth 3 (three) times daily., Disp: 20 tablet, Rfl: 0   vitamin B-12 (CYANOCOBALAMIN) 1000 MCG tablet, Take 1 tablet (1,000 mcg total) by mouth daily., Disp: 30 tablet, Rfl: 3   Vitamin D, Ergocalciferol, (DRISDOL) 50000 units CAPS capsule, Take 50,000 Units by mouth every Tuesday. , Disp: , Rfl:    bismuth subsalicylate (PEPTO BISMOL) 262 MG/15ML  suspension, Take 30 mLs by mouth every 6 (six) hours as needed for indigestion or diarrhea or loose stools. , Disp: , Rfl:  No current facility-administered medications for this visit.   Facility-Administered Medications Ordered in Other Visits:    epoetin alfa-epbx (RETACRIT) injection 10,000 Units, 10,000 Units, Subcutaneous, Once **AND** [DISCONTINUED] epoetin alfa-epbx (RETACRIT) injection 40,000 Units, 40,000 Units, Subcutaneous, Once, Sindy Guadeloupe, MD  Physical exam:  Vitals:   06/20/18 1124  BP: (!) 158/87  Pulse: 65  Resp: 18  Temp: (!) 97.1 F (36.2 C)  TempSrc: Tympanic  Weight: 180 lb 6.4 oz (81.8 kg)  Height: 6' (1.829 m)   Physical Exam Constitutional:      General: He is not in acute distress. HENT:     Head: Normocephalic and atraumatic.  Eyes:     Pupils: Pupils are equal, round, and reactive to light.  Neck:     Musculoskeletal: Normal range of motion.  Cardiovascular:     Rate and Rhythm: Normal rate and regular rhythm.     Heart sounds: Normal heart sounds.  Pulmonary:     Effort: Pulmonary effort is normal.     Breath sounds: Normal breath sounds.  Abdominal:     General: Bowel sounds are  normal.     Palpations: Abdomen is soft.  Musculoskeletal:     Comments: Trace bilateral edema  Skin:    General: Skin is warm and dry.  Neurological:     Mental Status: He is alert and oriented to person, place, and time.      CMP Latest Ref Rng & Units 01/24/2018  Glucose 70 - 99 mg/dL 134(H)  BUN 8 - 23 mg/dL 68(H)  Creatinine 0.61 - 1.24 mg/dL 3.28(H)  Sodium 135 - 145 mmol/L 135  Potassium 3.5 - 5.1 mmol/L 4.9  Chloride 98 - 111 mmol/L 110  CO2 22 - 32 mmol/L 19(L)  Calcium 8.9 - 10.3 mg/dL 8.3(L)  Total Protein 6.5 - 8.1 g/dL -  Total Bilirubin 0.3 - 1.2 mg/dL -  Alkaline Phos 38 - 126 U/L -  AST 15 - 41 U/L -  ALT 0 - 44 U/L -   CBC Latest Ref Rng & Units 06/20/2018  WBC 4.0 - 10.5 K/uL 6.4  Hemoglobin 13.0 - 17.0 g/dL 6.9(L)  Hematocrit 39.0 - 52.0 % 21.2(L)  Platelets 150 - 400 K/uL 185    Assessment and plan- Patient is a 68 y.o. male with anemia multifactorial secondary to iron deficiency as well as anemia of chronic kidney disease and MGUS  Patient's hemoglobin down to 6.9 today.  He has not consistently getting his Retacrit in the last few months due to elevated blood pressure.  He will receive his Retacrit today.  We will to give him blood transfusion on Monday.  In addition to this his ferritin levels to come back low at 34 and I will plan to give him 2 doses of Feraheme.  He will get CBC checked each month for possible hold tube and Retacrit  Repeat myeloma panel and serum free light chains in 2 months     Visit Diagnosis 1. Symptomatic anemia   2. Anemia in chronic kidney disease, unspecified CKD stage   3. MGUS (monoclonal gammopathy of unknown significance)      Dr. Randa Evens, MD, MPH [facility] [phone] 06/20/2018 11:52 AM

## 2018-06-21 NOTE — Progress Notes (Signed)
[name], [name] ([id number]) Visit Report for 06/19/2018 Chief Complaint Document Details Patient Name: [name], [name] Date of Service: 06/19/2018 8:30 AM Medical Record Number: [id number] Patient Account Number: [id number] Date of Birth/Sex: Apr 04, 1951 (68 y.o. M) Treating RN: Army Melia Primary Care Provider: Lamonte Sakai Other Clinician: Referring Provider: Lamonte Sakai Treating Provider/Extender: Melburn Hake, HOYT Weeks in Treatment: 37 Information Obtained from: Patient Chief Complaint Patient presents for treatment of an open diabetic ulcer to both feet Electronic Signature(s) Signed: 06/19/2018 3:26:18 PM By: Worthy Keeler PA-C Entered By: Worthy Keeler on 06/19/2018 09:02:46 Stenerson, [name] ([id number]) -------------------------------------------------------------------------------- Debridement Details Patient Name: [name], [name] Date of Service: 06/19/2018 8:30 AM Medical Record Number: [id number] Patient Account Number: [id number] Date of Birth/Sex: June 25, 1950 (68 y.o. M) Treating RN: Army Melia Primary Care Provider: Lamonte Sakai Other Clinician: Referring Provider: Lamonte Sakai Treating Provider/Extender: Melburn Hake, HOYT Weeks in Treatment: 90 Debridement Performed for Wound #17 Right,Distal,Plantar Metatarsal head second Assessment: Performed By: Physician STONE III, HOYT E., PA-C Debridement Type: Debridement Severity of Tissue Pre Fat layer exposed Debridement: Level of Consciousness (Pre- Awake and Alert procedure): Pre-procedure Verification/Time Yes - 09:07 Out Taken: Start Time: 09:07 Pain Control: Lidocaine Total Area Debrided (L x W): 1 (cm) x 0.2 (cm) = 0.2 (cm) Tissue and other material Non-Viable, Callus debrided: Level: Non-Viable Tissue Debridement Description: Selective/Open Wound Instrument: Curette Bleeding: None End Time: 09:09 Response to Treatment: Procedure was tolerated well Level of Consciousness Awake and  Alert (Post-procedure): Post Debridement Measurements of Total Wound Length: (cm) 1 Width: (cm) 0.2 Depth: (cm) 0.2 Volume: (cm) 0.031 Character of Wound/Ulcer Post Debridement: Stable Severity of Tissue Post Debridement: Fat layer exposed Post Procedure Diagnosis Same as Pre-procedure Electronic Signature(s) Signed: 06/19/2018 3:26:18 PM By: Worthy Keeler PA-C Signed: 06/20/2018 3:17:15 PM By: Army Melia Entered By: Army Melia on 06/19/2018 09:08:37 Sutphin, [name] ([id number]) -------------------------------------------------------------------------------- HPI Details Patient Name: [name], [name] Date of Service: 06/19/2018 8:30 AM Medical Record Number: [id number] Patient Account Number: [id number] Date of Birth/Sex: 05/10/1950 (68 y.o. M) Treating RN: Army Melia Primary Care Provider: Lamonte Sakai Other Clinician: Referring Provider: Lamonte Sakai Treating Provider/Extender: Melburn Hake, HOYT Weeks in Treatment: 74 History of Present Illness HPI Description: 68 year old patient here to see as for bilateral feet ulceration to on his left first and second toe and 2 on his right first and second toe, which she's had for about 4 months. 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 Donella Stade. The patient is also undergoing workup by medical oncology for a intra-abdominal lymphadenopathy. 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 He is also status post appendectomy, inguinal lymph node biopsy, rectal biopsy and rectal examination under anesthesia. he currently smokes cigarettes about half packet a day. In March of this year he was seen by Dr. Hortencia Pilar, for evaluation of bilateral varicose veins and besides wearing compression  stockings he had recommended laser ablation of the right and left great saphenous veins to eleviate the symptoms and complications of severe superficial venous reflux disease. He also recommended lymphedema pumps for better control of his lymphedema. The patient recently has had on 08/23/2016, right greater saphenous vein ablation with the laser energy Earlier lower extremity venous reflux examination done on 05/08/2016 showed no DVT or  SVT both lower legs but incompetence of bilateral great saphenous veins was present. A lower arterial study was also done and there was no significant right lower and left lower extremity problems based on a normal toe brachial index bilaterally and the ABI was 1.21 the left and 1.23 on the right. His post ablation venous duplex examination showed successful ablation of the right GS vein with thrombus formation 2 below the right saphenofemoral junction. The deep system was patent without evidence of thrombosis and this was done on 08/30/2016. 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 Addendum: regarding his x-rays done today and x-ray of the left foot -- IMPRESSION: No objective evidence of osteomyelitis. There are soft tissue changes which may reflect cellulitis. X-ray of the right foot -- IMPRESSION:Findings compatible with cellulitis of the toes. No objective evidence of osteomyelitis is observed. 10/01/16 on evaluation today patient's wounds appeared to be doing some better. I did review the x-rays as well which showed no evidence of osteomyelitis although there was evidence on x-ray of cellulitis. He fortunately is not having any discomfort although he continues to have some swelling. He does not remember being on any antibiotics recently. 10/15/16 on evaluation today patient's wounds overall appear to be doing better although he does have a new  location noted on the left foot. Fortunately he is not having significant pain. It almost has the appearance that something is rubbing on the end of his toes but he wears the open toe shoes and according to what he is telling me never wears anything that would rub on his foot. There is no evidence of infection and specifically no evidence of a fungal infection 10/22/16 On evaluation today patient's wounds appeared to be doing better compared to last week in regard to his bilateral lower extremities. Fortunately I happy with how things are progressing although he still has ulcers I feel like that he is improving and appropriate manner. 11/12/16 on evaluation today patient appears to be doing well in regard to his bilateral feet and the respective wounds. We have been using surrounding her dressings along with an antifungal cream which seems to be doing very well. He has no bilateral dysfunction noticed that the rituals are weight loss at this point. He also has no nausea or vomiting a note purulent discharge. He did see Vein and vascular today and he tells me that they told him he could have surgery for his venous stasis but they did not feel like it was worth it in his words. Fortunately patient's wounds do appear to be getting sneakily better. ROYSTON, BEKELE ([id number]) 11/26/2016 -- he says he is going to have some surgery during this week at [facility] for possibly a colon resection. 12/31/2016 -- the patient has been noncompliant with his smoking and I'm not sure whether he is also started drinking again. He continues to be very nonchalant about his care 01/14/2017 -- the patient's HandP has been reviewed well and I understand he is being compliant with trying to give up smoking and his local dressing changes. He does not have any surgical options of 4 to him by his vascular surgeons.he was last seen in early August by Dr. Hortencia Pilar who recommended compression stockings,and possibly  lymph pumps in 2-3 months after doing a review ultrasound. 01/28/2017 - the patient did not have any fresh complaints but on examination I noted  a large lacerated wound on the plantar aspect of his right fourth toe which had a lot of necrotic debris and it probes down to bone. 02/07/2017 -- x-ray of the right foot -- IMPRESSION: Soft tissue swelling about the first through fourth toes consistent with cellulitis. New destructive change in the tuft of the distal phalanx of the great toe is consistent with osteomyelitis. 02/14/2017 -- the patient's MRI is pending this coming Monday and he still continues to smoke. We have again gone over off loading of his wounds in great detail and he says he's been compliant. 02/21/2017 -- MR of the right foot -- IMPRESSION: 1. Soft tissue ulcer at the tip of the first, second and third toe knows. Cortical irregularity and bone marrow edema in the first distal phalanx most concerning for osteomyelitis. Mild marrow edema in the second and third distal phalanx without definite cortical destruction which may reflect early osteomyelitis versus reactive marrow edema. 2. Soft tissue edema surrounding the first phalanx most consistent with cellulitis. the patient was also recently evaluated by his medical oncologist Dr. 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 She is treating him with weekly Procrit. She is also keeping intra-abdominal lymphadenopathy and right lower lobe lung nodule under observation. 04/04/2017 -- he was seen by Dr. Adrian Prows on 03/25/2017 -- after review he empirically put him on ciprofloxacin and doxycycline as they have good bone penetration and good bioavailability and it will cover the usual pathogens and diabetic foot osteomyelitis. He will check inflammatory markers and plan a 49-2 week old records. C-reactive protein was 0.3 and the ESR was 72 04/18/17 on evaluation  today patient appears to be doing about the same in regard to his lower extremity wounds bilaterally. He has continued to use the antifungal cream which does seem to be beneficial. Nonetheless the ulcers do seem to in some areas be epithelial eyes over and in other areas are still open. He is having no significant discomfort. 04/25/17-he is here in follow-up evaluation for multiple ulcerations to multiple toes bilaterally. He states he did see Dr. Ola Spurr again last week and continues antibiotic therapy. He is voicing no complaints or concerns, will continue with current treatment plan will possibility of adding compression therapy next week after an additional week of treatment/lotions to BLE prescribed by Dr Ola Spurr 05/02/17 he is here in follow up for for multiple ulcers to multiple toes bilaterally. we will stop using antifungal cream and will continue with silvercel and follow up next week 05/09/17-he is here in follow-up for multiple ulcerations to multiple toes bilaterally. There is improvement in appearance. He has not completely stopped using antifungal cream, but admits he has not using it between the toes. He has an appointment with Dr. Ola Spurr on 2/11, continues on doxycycline and Cipro. It has been 5 weeks of antibiotic therapy, we will order plain film xray to evaluate for osteomyelitis next week, prior to follow up with ID. Will continue with silvercel and follow up next week 05/16/17-he is here in follow-up evaluation for multiple ulcerations to multiple toes bilaterally and new wound to the right posterior heel. There is essentially no change in appearance, deteriorating measurements; he has a history of waxing and waning measurements. He admits that he continues to apply moisturizer/cream/ointment to his toes despite weekly reminders to only apply silvercel to his toes. He states that he thinks the surgical shoe contributed to the superficial ulcer to his posterior  heel, he is  unable to articulate if this was an area of dry cracked skin as he has a similar area to the left heel. He now is wearing open toed slippers. He has an appointment with Dr. Ola Spurr on 2/11. We have ordered x-rays for her bilateral feet; he was advised to obtain the x-rays today or tomorrow. He will follow-up next week 05/23/17-he is here in follow-up evaluation for multiple ulcerations to multiple toes bilaterally and the right posterior heel. There is improvement in maceration. He has been compliant and not applying any moisturizing agent to his toes. He has been using Lac-Hydrin for his lower extremities with improvement. He did not go to his appointment on Monday with Dr. Ola Spurr secondary to financial concerns. X-rays for her bilateral feet showed: LEFT FOOT with slight erosion of the tuft of the distal phalanges of the left first and second toe suspicious for osteomyelitis, RIGHT FOOT with 1.erosion of the tufts of the distal Osmun, Vishaal E. ([id number]) phalanges of the right first second and possibly third toes consistent with osteomyelitis, 2 no definitive abnormality of the calcaneus is seen on the images obtained, 3. Plantar calcaneal degenerative spur. We briefly discussed hyperbaric adjunctive therapy for treatment of chronic refractory osteomyelitis. I do not find an a1c in EMR, will contact PCP for record, or order if needed. He has been encouraged to contact Dr Ola Spurr office regarding the follow-up appointment, encouraged him to inquire about payment plan. We will continue with same treatment plan and follow-up next week. He states he is still taking antibiotics and has "a lot" left. He states he has been taking them as directed, 2 pills twice daily. According to Dr. Blane Ohara office notes he was originally started on 12/17 for 4 weeks and extended on 1/14 for an additional 4 weeks. He should be done with his antibiotic therapy, he was advised to bring his bottles and to his  next appointment, we will contact pharmacy. 05/30/17-he is here in follow-up evaluation for multiple ulcerations to multiple toes bilaterally and the right posterior heel. He is accompanied by his brother-in-law. Wounds are stable. He has yet to make up with Dr. Ola Spurr. We contacted his PCP, with no record of recent A1c we will draw an A1c. His brother-in-law states that he was taken off all of his diabetic medication secondary to kidney function. 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 The antibiotics were counted and have approximately 2 weeks left, although they should be complete. The pill organizer reveals missing days. We discussed the need for consistent medications, to have optimal benefit of medication. He has a cousin that lives with him and he will ask her to check his organizer daily. He has been advised to follow up with Dr Ola Spurr, and will go by the office today. He has been advised to quit smoking. 06/06/17-he is here in follow up evaluation. He has had to make an appointment with Dr. Ola Spurr. He did have blood work obtained, a1c 5. He continues to take antibiotic therapy. Significant improvement in bilateral lower extremity edema with compression therapy. Essentially no change in ulcerations to toes. He states he is "going to try something different" and "let me know next week" if it works; he would not provide any additional information and was encouraged to follow our orders. We will follow up next week 06/13/17-he is here in follow-up evaluation. He has an appointment with Dr. Ola Spurr tomorrow  morning. He states he purchased an ointment from [facility] and applied to his toes for 3 days, he does not remember the name of the ointment. There is improvement to his wounds, minimal maceration. He continues to take antibiotic therapy, this should have been completed last month.  His brother-in-law who regularly accompanies his appointments was asked to take the bottles to the appointment tomorrow with Dr. Ola Spurr so he is aware. We will continue with 3 layer compression, and order OPEN TOE compression 20-30mmHg; we will apply compression stockings next week. He continues to smoke, smoked "2 cigarettes" last week 06/20/17 on evaluation today patient did receive his compression stockings which he has with him today for both lower extremities. With that being said he tells me at this point in time that he is very happy to have these he really is not a big fan of the compression wraps that we have been utilizing although they have been of great benefit for him. Nonetheless at this point he does want to switch to the compression stockings. In my opinion as long as he is continuing with compression I'm okay with the stockings or the wraps. 06/27/17-he is here in follow-up evaluation for multiple ulcerations to his bilateral toes. There is some improvement in appearance. He is compliant in wearing his compression stockings with significant improvement in lower extremity edema. He saw Dr Ola Spurr on 3/8, per his notes they would redraw ESR and CRP; plan to continue antibiotic therapy if these remain elevated. I do not see an ESR or CRP level in Epic. The patient continues to take antibiotics. 07/11/17-He is here in follow-up evaluation for multiple ulcerations to multiple toes bilaterally. He presents with complete epithelialization to the right third toe; there has been no deterioration. He continues on antibiotic therapy. He will follow-up next week 07/18/17-He is here in follow-up evaluation for multiple ulcerations to multiple toes bilaterally. He continues to make improvement. He continues on antibiotic therapy. He states he has been using something additional to our orders, he does not elaborate but states he will bring it in next week. 07/25/17-He is here in follow up  evaluation for multiple ulcerations to bilateral toes. He is stable. He has completed antibiotic therapy. He admits to "filing" his toes after showers each evening, this is what he was referencing last week; he does not filing for the wounds. We will switch to [facility] and monitor for any improvement, he will follow-up next week 08/01/17-He is here in follow-up evaluation. He admits to "picking" at his toes after cleansing yesterday, leading to new areas of tissue loss on the bilateral second toe. There is improvement noted to the bilateral great toe. We will dress toes today and hope that they maintain until Monday where he will come in for a nurse visit. He has been advised, multiple times with expressed verbalization, to change the dressings to silvercel if the dressings get wet prior to [facility] appointment. 08/08/17-He is here in follow-up evaluation for bilateral first and second toe ulcerations. There is significant improvement to all ulcerations since last visit. We will switch to [facility] to all wounds and he will continue with nurse visits on a Monday/Thursday schedule and follow-up with me in 2 weeks. He continues to smoke, 1-3 cigarettes per day, and has been encouraged to not smoke until his next follow-up in 2 weeks. 08/15/17 on evaluation today patient's ulcers on his toes actually appear to be doing fairly well the right to ulcers may be a little bit  more moist compared to the left I'm not really sure exactly why as the openings appear to be very small and I'm not seeing any evidence of anything significant as far as you lightest or otherwise. Nonetheless this may just be a small setback he's Hoot, Yaviel E. ([id number]) been doing very well with the [facility] Dressing 08/22/17-He is here in follow-up evaluation for ulcerations to his bilateral first and second toes, there is small/scant amount of drainage noted on today's dressing. He continues with Hydrofera Blue. He  continues to smoke, 1 cigarette a day. Voices no complaint or concerns, compliant and compression therapy today. He'll follow-up next week 08/29/17-He is here in follow-up evaluation for ulcerations to his bilateral first and second toes, with a new wound to the left third toe. He states he cut himself while cutting the toenail. He presents today with more maceration and increased measurements; waxing and waning measurements and moisture has been an ongoing issue. He continues to smoke a proximally 1 cigarette per day, but states he has not smoked since Sunday. We will initiate medihoney daily and evaluate next week. He presents today without compression stockings 09/05/17-He is here in follow-up evaluation for ulcerations to bilateral first and second and left third toe. He did not pick up the medihoney and therefore has not been changing his dressing. He admits to inconsistent where of compression stockings, admits to pain to his bilateral lower extremities with unilateral edema (right greater than left). The ulcerations to multiple toes are more macerated and larger in size than last week. He is wearing compression therapy today. He admits to an overall feeling of weakness and fatigue and has been encouraged to contact his PCP for evaluation; he has a known history of anemia and intraabdominal lymphadenopathy. We will return to silvercel and he will follow up next week 09/19/17-He is here in follow-up evaluation for ulcerations to bilateral first and second toe.he was unable to make last week's appointment. The culture that was obtained on 5/30 grew clindamycin sensitive MRSA; he was initiated on 6/4 but did not start it until 6/7. There is significant improvement in all wounds, healing to left second and third toe. He also admits to having no alcohol or smoking for the last 6 days. We will continue with same treatment plan I will extend the clindamycin for an additional 10 days and he will  follow-up next week. 09/26/17- He is here in follow evaluation for multiple ulcerations to multiple toes bilaterally. He continues to be non-compliant with dressing, compression therapy. He states he is taking the antibiotics as prescribed, although his recall is inconsistent. There is noted deterioration in all ulcers. We will continue same treatment plan, antibiotic therapy and he will follow up next week. 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 This information is obtained from the medical record, as he cannot articulate specifics to his diagnosis or treatment plan. 10/17/17-He is here in follow-up evaluation for multiple ulcerations to multiple toes bilaterally. There is chronic waxing and waning improvement/deterioration. On today's exam they appear improved, dry without maceration. He continues to apply a variety of topical agents, stating that he has switched between the blue product and silver product. In my opinion, this will be a chronic waxing and waning giving his intermittent compliance. He will follow-up in 2 weeks. As it relates  to the biopsy taken from his buttock, he has not followed up with that and has not started radiation therapy. 10/31/17-He is here for evaluation for multiple ulcerations to multiple toes bilaterally. Continues with waxing and waning improvement versus deterioration. They are stable on today's exam without significant maceration. 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 We will continue with every 2 week follow-up appointments 11/14/17- He is seen in follow-up evaluation for multiple ulcerations to multiple bilateral toes. He is scheduled for excision of basal cell carcinoma to his right buttock on Monday 8/12. He continues to have chronic waxing and waning of  his ulcers; today is a stable day with minimal drainage and left ear is better than right toes. I will recount to infectious disease regarding chronic suppressive therapy. He admits to being more compliant with compression stockings, although he is not wearing any today. He has been encouraged to continue with dressing changes as ordered and compression therapy daily. He will be seen in 2 weeks 11/28/17-He is seen in follow up evaluation for multiple ulcerations to multiple toes bilaterally. He had an overnight admission at [facility] (8/11-8/12) for symptomatic anemia, received two units prbc. His surgical date has been moved to 9/94 excision of basal cell carcinoma to the right buttock. He has been noncompliant in wearing compression stockings, applying topical treatment as ordered; he has been applying a "cream", Hydrofera Blue, silvercel. He is currently on no antibiotic therapy; we will send for re-referral to ID for suppressive therapy given his multiple co-morbidities, poor surgical candidate, unwilling to have toe amputation and general non-adherence. 12/19/17 on evaluation today patient actually appears to be doing very well in regard to his toes at least compared to last time I saw him. He again has been coming to our office for quite a bit of time. 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 He states is hurting a little bit but doing well in general. I'm see were not taking care of this area dermatology is. He does have his appointment with infectious disease upcoming which they re-consult for suppressive therapy that's next Monday. 01/02/18 on evaluation today patient actually appears to be doing rather well in regard to his toe ulcers although he tells me he has been put in an ointment on this which I am concerned may be causing some additional moisture buildup which we  really do not want. He supposed be using silver cell which is mainly thing I want him to be utilizing. Fortunately there does not appear to be any evidence of worsening infection which is good news. He has had surgery on his gluteal region where he had an excision of a cancerous area fortunately that seems to be doing well although he is having some discomfort. 02/06/18 on evaluation today patient actually appears to be doing well at all locations except for his second toe use bilaterally Branscomb, Dameian E. ([id number]) where the distal tip is still wrong open. He did state that he can't stand for dry skin to buildup on his toes subsequently as a question and further it sounds as if he actually pills this all which is likely keeping the toe is wrong and open. Also think that's what happened in the bottom of his foot although that appears to have healed at this point. There does not appear to be any evidence of infection. 02/20/18 on  evaluation today patient appears to be doing better in regard to his foot ulcers. Everything has shown signs of improvement which is excellent news. Overall I'm very pleased with how things appear currently. The patient states he's not picking her point at any skin and that he wears shoes at all times when he is ambulating at home. Both are things that I've advocated and recommended for him in the past. 03/13/18 on evaluation today patient actually appears to be doing very well in regard to the bilateral toes in the overall appearance of the wound bed locations. Fortunately there does not appear to be any signs of infection at this time. He states that he is very pleased with how things seem to be progressing to be honest as am I. 03/27/1899 evaluation today patient appears to be doing well in regard to his toe ulcers. He just has a few areas remaining and these appear to be showing signs of improvement as well which is great news. Overall I have been impressed with  the progress of the past several weeks. 04/10/18 on evaluation today patient appears to be doing a little bit worse in regard to the right plantar foot at the regional the first metatarsal as well as the left second toe. He tells me that he ran out of bandages he also tells me he's been wearing his flip-flops a lot at home as well as a new pair of boots that he received as well. This is as opposed to the postop shoes that we had previously given him. Nonetheless overall I feel like that he has had a little bit more friction to the area which is causing things to look a little bit worse today. 04/24/18 on evaluation today patient's wounds for the most part appear to be doing excellent especially in regard to the bilateral second toes. With that being said he does have issues with the wound still on the first metatarsal region and first toe of the right foot which have not completely closed at this point. Nonetheless I do feel like that he is making progress which is good news. 05/08/18 on evaluation today patient's wounds appear to be doing just a little bit worse than normal simply due to the fact that he has not had dressing supplies. He apparently owed $35 in order for them to be shipped and unfortunately is not able to pay that currently. We did give him information for the [employer] today which will hopefully help cover his dressing supplies. He was thankful for this he states is gonna contact them this afternoon. Fortunately otherwise or does not hear any signs of infection. 05/22/18 on evaluation today patient appears to be doing somewhat poorly in regard to his toes. Upon further questioning it appears that he actually has been pulling skin off of the toes which "drive him crazy". Subsequently as he does this he calls his new areas to tear open and I think this is one of the reasons why his toes are not healing as appropriately as we would like. He fortunately has no signs of  infection at this time. With that being said I'm still concerned about the possibility of a fungal infection even though there doesn't appear to be anything bacterial. Potentially nystatin powder could be of benefit for him as far as trying to help this in that regard. 06/05/18 on evaluation today patient actually appears to be doing very well in regard to his foot ulcers. Fortunately there's no signs of infection.  He's been tolerating the dressing changes without complication. I'm very pleased with how things seem to be progressing. Fortunately there's no evidence of active infection at this point I do believe nystatin powder has been of benefit for him. 06/19/18 on evaluation today patient actually appears to be doing a little worse compared to last evaluation. Tells me that he has not had any supplies for two weeks. With that being said his plantar foot wounds on the right actually appear to be doing very well. It's actually a second toes in the first toes that are the worst. I think these are areas the was able to get to and pick and he tells me in fact that he destine to pick out some of the dry skin because it causes him annoyance. Nonetheless I think this is somewhat detrimental to him I think it's worth keeping part of the wound open in particular. Fortunately there is no sign of infection at this time although I think that is a risk which I discussed specifically that he's not careful you can end up reopening this and causing greater problems for himself. Electronic Signature(s) Signed: 06/19/2018 3:26:18 PM By: Worthy Keeler PA-C Entered By: Worthy Keeler on 06/19/2018 10:06:01 Jon, AVETT REINECK ([id number]) Stanger, [name] ([id number]) -------------------------------------------------------------------------------- Physical Exam Details Patient Name: Sahakian, Sofia E. Date of Service: 06/19/2018 8:30 AM Medical Record Number: [id number] Patient Account Number: [id number] Date of  Birth/Sex: Sep 14, 1950 (68 y.o. M) Treating RN: Army Melia Primary Care Provider: Lamonte Sakai Other Clinician: Referring Provider: Lamonte Sakai Treating Provider/Extender: Melburn Hake, HOYT Weeks in Treatment: 42 Constitutional Well-nourished and well-hydrated in no acute distress. Respiratory normal breathing without difficulty. Psychiatric this patient is able to make decisions and demonstrates good insight into disease process. Alert and Oriented x 3. pleasant and cooperative. Notes Upon evaluation today patient's wound bed between the first and second toe on the right did require some pairing of the callous although I did not have to actually debride the wound itself. What's unclear this way it actually appeared to be doing much better and definitely felt much better for the patient. In the future I told him that if he needs anything like this done I'll be happy to do this for him so that he does not have to pick up the area himself. Electronic Signature(s) Signed: 06/19/2018 3:26:18 PM By: Worthy Keeler PA-C Entered By: Worthy Keeler on 06/19/2018 10:06:35 Schrack, [name] ([id number]) -------------------------------------------------------------------------------- Physician Orders Details Patient Name: Freer, Korbyn E. Date of Service: 06/19/2018 8:30 AM Medical Record Number: [id number] Patient Account Number: [id number] Date of Birth/Sex: January 28, 1951 (68 y.o. M) Treating RN: Army Melia Primary Care Provider: Lamonte Sakai Other Clinician: Referring Provider: Lamonte Sakai Treating Provider/Extender: Melburn Hake, HOYT Weeks in Treatment: 59 Verbal / Phone Orders: No Diagnosis Coding ICD-10 Coding Code Description L97.521 Non-pressure chronic ulcer of other part of left foot limited to breakdown of skin L97.511 Non-pressure chronic ulcer of other part of right foot limited to breakdown of skin E11.621 Type 2 diabetes mellitus with foot ulcer I87.323 Chronic venous hypertension  (idiopathic) with inflammation of bilateral lower extremity I89.0 Lymphedema, not elsewhere classified F17.218 Nicotine dependence, cigarettes, 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 abuse 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, right ankle and foot Wound Cleansing Wound #14 Left Toe Second o Clean wound with Normal Saline. Wound #16 Right,Plantar Toe Great o Clean wound with Normal Saline. Wound #17 Right,Distal,Plantar Metatarsal head second o Clean wound with Normal Saline.  Wound #18 Right,Dorsal Toe Great o Clean wound with Normal Saline. Wound #2 Right Toe Second o Clean wound with Normal Saline. Anesthetic (add to Medication List) Wound #14 Left Toe Second o Topical Lidocaine 4% cream applied to wound bed prior to debridement (In Clinic Only). Wound #16 Right,Plantar Toe Great o Topical Lidocaine 4% cream applied to wound bed prior to debridement (In Clinic Only). Wound #17 Right,Distal,Plantar Metatarsal head second o Topical Lidocaine 4% cream applied to wound bed prior to debridement (In Clinic Only). Wound #18 Right,Dorsal Toe Great o Topical Lidocaine 4% cream applied to wound bed prior to debridement (In Clinic Only). Wound #2 Right Toe Second o Topical Lidocaine 4% cream applied to wound bed prior to debridement (In Clinic Only). RAEQWON, LUX ([id number]) Skin Barriers/Peri-Wound Care Wound #14 Left Toe Second o Antifungal powder-Nystatin Wound #16 Right,Plantar Toe Great o Antifungal powder-Nystatin Wound #17 Right,Distal,Plantar Metatarsal head second o Antifungal powder-Nystatin Wound #18 Right,Dorsal Toe Great o Antifungal powder-Nystatin Wound #2 Right Toe Second o Antifungal powder-Nystatin Primary Wound Dressing Wound #14 Left Toe Second o Silver Alginate - secure with tape Wound #16 Right,Plantar Toe Great o Silver Alginate - secure with tape Wound #17  Right,Distal,Plantar Metatarsal head second o Silver Alginate - secure with tape Wound #18 Right,Dorsal Toe Great o Silver Alginate - secure with tape Wound #2 Right Toe Second o Silver Alginate - secure with tape Dressing Change Frequency Wound #14 Left Toe Second o Change dressing every day. Wound #16 Right,Plantar Toe Great o Change dressing every day. Wound #17 Right,Distal,Plantar Metatarsal head second o Change dressing every day. Wound #18 Right,Dorsal Toe Great o Change dressing every day. Wound #2 Right Toe Second o Change dressing every day. Follow-up Appointments Wound #14 Left Toe Second o Return Appointment in 2 weeks. Wound #16 Right,Plantar Toe Great o Return Appointment in 2 weeks. Wound #17 Right,Distal,Plantar Metatarsal head second Solorio, Raynold E. ([id number]) o Return Appointment in 2 weeks. Wound #18 Right,Dorsal Toe Great o Return Appointment in 2 weeks. Wound #2 Right Toe Second o Return Appointment in 2 weeks. Edema Control Wound #14 Left Toe Second o Patient to wear own compression stockings Wound #16 Right,Plantar Toe Great o Patient to wear own compression stockings Wound #17 Right,Distal,Plantar Metatarsal head second o Patient to wear own compression stockings Wound #18 Right,Dorsal Toe Great o Patient to wear own compression stockings Wound #2 Right Toe Second o Patient to wear own compression stockings Additional Orders / Instructions Wound #14 Left Toe Second o Stop Smoking o Increase protein intake. Wound #16 Right,Plantar Toe Great o Stop Smoking o Increase protein intake. Wound #17 Right,Distal,Plantar Metatarsal head second o Stop Smoking o Increase protein intake. Wound #18 Right,Dorsal Toe Great o Stop Smoking o Increase protein intake. Wound #2 Right Toe Second o Stop Smoking o Increase protein intake. Medications-please add to medication list. Wound #14 Left Toe  Second o Other: - Nystatin Powder Wound #16 Right,Plantar Toe Great o Other: - Nystatin Powder Wound #17 Right,Distal,Plantar Metatarsal head second o Other: - Nystatin Powder Wound #18 Right,Dorsal Toe Derrek Puff, Abenezer E. ([id number]) o Other: - Nystatin Powder Wound #2 Right Toe Second o Other: - Nystatin Powder Electronic Signature(s) Signed: 06/19/2018 3:26:18 PM By: Worthy Keeler PA-C Signed: 06/20/2018 3:17:15 PM By: Army Melia Entered By: Army Melia on 06/19/2018 09:11:44 Michaelson, [name] ([id number]) -------------------------------------------------------------------------------- Problem List Details Patient Name: Hofmann, Abhijot E. Date of Service: 06/19/2018 8:30 AM Medical Record Number: [id number] Patient Account Number: [id number] Date of  Birth/Sex: 01/27/1951 (68 y.o. M) Treating RN: Army Melia Primary Care Provider: Lamonte Sakai Other Clinician: Referring Provider: Lamonte Sakai Treating Provider/Extender: Melburn Hake, HOYT Weeks in Treatment: 7 Active Problems ICD-10 Evaluated Encounter Code Description Active Date Today Diagnosis L97.521 Non-pressure chronic ulcer of other part of left foot limited to 09/21/2016 No Yes breakdown of skin L97.511 Non-pressure chronic ulcer of other part of right foot limited to 09/21/2016 No Yes breakdown of skin E11.621 Type 2 diabetes mellitus with foot ulcer 09/21/2016 No Yes I87.323 Chronic venous hypertension (idiopathic) with inflammation of 09/21/2016 No Yes bilateral lower extremity I89.0 Lymphedema, not elsewhere classified 09/21/2016 No Yes F17.218 Nicotine dependence, cigarettes, with other nicotine-induced 09/21/2016 No Yes disorders F10.19 Alcohol abuse with unspecified alcohol-induced disorder 09/21/2016 No Yes M86.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, right ankle and foot 02/21/2017 No Yes Inactive Problems Resolved Problems Wolanski, YECHIEL ERNY ([id number]) Electronic Signature(s) Signed: 06/19/2018 3:26:18 PM  By: Worthy Keeler PA-C Entered By: Worthy Keeler on 06/19/2018 09:02:42 Senk, [name] ([id number]) -------------------------------------------------------------------------------- Progress Note Details Patient Name: Ells, Eirik E. Date of Service: 06/19/2018 8:30 AM Medical Record Number: [id number] Patient Account Number: [id number] Date of Birth/Sex: 1951-03-10 (68 y.o. M) Treating RN: Army Melia Primary Care Provider: Lamonte Sakai Other Clinician: Referring Provider: Lamonte Sakai Treating Provider/Extender: Melburn Hake, HOYT Weeks in Treatment: 62 Subjective Chief Complaint Information obtained from Patient Patient presents for treatment of an open diabetic ulcer to both feet History of Present Illness (HPI) 68 year old patient here to see as for bilateral feet ulceration to on his left first and second toe and 2 on his right first and second toe, which she's had for about 4 months. 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 Donella Stade. The patient is also undergoing workup by medical oncology for a intra-abdominal lymphadenopathy. 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 He is also status post appendectomy, inguinal lymph node biopsy, rectal biopsy and rectal examination under anesthesia. he currently smokes cigarettes about half packet a day. In March of this year he was seen by Dr. Hortencia Pilar, for evaluation of bilateral varicose veins and besides wearing compression stockings he had recommended laser ablation of the right and left great saphenous veins to eleviate the symptoms and complications of severe superficial venous reflux disease. He also recommended lymphedema pumps for better control of his lymphedema. The patient  recently has had on 08/23/2016, right greater saphenous vein ablation with the laser energy Earlier lower extremity venous reflux examination done on 05/08/2016 showed no DVT or SVT both lower legs but incompetence of bilateral great saphenous veins was present. A lower arterial study was also done and there was no significant right lower and left lower extremity problems based on a normal toe brachial index bilaterally and the ABI was 1.21 the left and 1.23 on the right. His post ablation venous duplex examination showed successful ablation of the right GS vein with thrombus formation 2 below the right saphenofemoral junction. The deep system was patent without evidence of thrombosis and this was done on 08/30/2016. 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 Addendum: regarding his x-rays done today and x-ray of the left foot -- IMPRESSION: No  objective evidence of osteomyelitis. There are soft tissue changes which may reflect cellulitis. X-ray of the right foot -- IMPRESSION:Findings compatible with cellulitis of the toes. No objective evidence of osteomyelitis is observed. 10/01/16 on evaluation today patient's wounds appeared to be doing some better. I did review the x-rays as well which showed no evidence of osteomyelitis although there was evidence on x-ray of cellulitis. He fortunately is not having any discomfort although he continues to have some swelling. He does not remember being on any antibiotics recently. 10/15/16 on evaluation today patient's wounds overall appear to be doing better although he does have a new location noted on the left foot. Fortunately he is not having significant pain. It almost has the appearance that something is rubbing on the end of his toes but he wears the open toe shoes and according to what he is telling me never wears anything that would rub on  his foot. There is no evidence of infection and specifically no evidence of a fungal infection 10/22/16 On evaluation today patient's wounds appeared to be doing better compared to last week in regard to his bilateral lower extremities. Fortunately I happy with how things are progressing although he still has ulcers I feel like that he is improving and appropriate manner. NUEL, DEJAYNES ([id number]) 11/12/16 on evaluation today patient appears to be doing well in regard to his bilateral feet and the respective wounds. We have been using surrounding her dressings along with an antifungal cream which seems to be doing very well. He has no bilateral dysfunction noticed that the rituals are weight loss at this point. He also has no nausea or vomiting a note purulent discharge. He did see Vein and vascular today and he tells me that they told him he could have surgery for his venous stasis but they did not feel like it was worth it in his words. Fortunately patient's wounds do appear to be getting sneakily better. 11/26/2016 -- he says he is going to have some surgery during this week at [facility] for possibly a colon resection. 12/31/2016 -- the patient has been noncompliant with his smoking and I'm not sure whether he is also started drinking again. He continues to be very nonchalant about his care 01/14/2017 -- the patient's HandP has been reviewed well and I understand he is being compliant with trying to give up smoking and his local dressing changes. He does not have any surgical options of 4 to him by his vascular surgeons.he was last seen in early August by Dr. Hortencia Pilar who recommended compression stockings,and possibly lymph pumps in 2-3 months after doing a review ultrasound. 01/28/2017 - the patient did not have any fresh complaints but on examination I noted a large lacerated wound on the plantar aspect of his right fourth toe which had a lot of necrotic debris and it probes down to  bone. 02/07/2017 -- x-ray of the right foot -- IMPRESSION: Soft tissue swelling about the first through fourth toes consistent with cellulitis. New destructive change in the tuft of the distal phalanx of the great toe is consistent with osteomyelitis. 02/14/2017 -- the patient's MRI is pending this coming Monday and he still continues to smoke. We have again gone over off loading of his wounds in great detail and he says he's been compliant. 02/21/2017 -- MR of the right foot -- IMPRESSION: 1. Soft tissue ulcer at the tip of the first, second and third toe knows. Cortical irregularity and bone marrow edema in  the first distal phalanx most concerning for osteomyelitis. Mild marrow edema in the second and third distal phalanx without definite cortical destruction which may reflect early osteomyelitis versus reactive marrow edema. 2. Soft tissue edema surrounding the first phalanx most consistent with cellulitis. the patient was also recently evaluated by his medical oncologist Dr. 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 She is treating him with weekly Procrit. She is also keeping intra-abdominal lymphadenopathy and right lower lobe lung nodule under observation. 04/04/2017 -- he was seen by Dr. Adrian Prows on 03/25/2017 -- after review he empirically put him on ciprofloxacin and doxycycline as they have good bone penetration and good bioavailability and it will cover the usual pathogens and diabetic foot osteomyelitis. He will check inflammatory markers and plan a 74-59 week old records. C-reactive protein was 0.3 and the ESR was 72 04/18/17 on evaluation today patient appears to be doing about the same in regard to his lower extremity wounds bilaterally. He has continued to use the antifungal cream which does seem to be beneficial. Nonetheless the ulcers do seem to in some areas be epithelial eyes over and in other areas  are still open. He is having no significant discomfort. 04/25/17-he is here in follow-up evaluation for multiple ulcerations to multiple toes bilaterally. He states he did see Dr. Ola Spurr again last week and continues antibiotic therapy. He is voicing no complaints or concerns, will continue with current treatment plan will possibility of adding compression therapy next week after an additional week of treatment/lotions to BLE prescribed by Dr Ola Spurr 05/02/17 he is here in follow up for for multiple ulcers to multiple toes bilaterally. we will stop using antifungal cream and will continue with silvercel and follow up next week 05/09/17-he is here in follow-up for multiple ulcerations to multiple toes bilaterally. There is improvement in appearance. He has not completely stopped using antifungal cream, but admits he has not using it between the toes. He has an appointment with Dr. Ola Spurr on 2/11, continues on doxycycline and Cipro. It has been 5 weeks of antibiotic therapy, we will order plain film xray to evaluate for osteomyelitis next week, prior to follow up with ID. Will continue with silvercel and follow up next week 05/16/17-he is here in follow-up evaluation for multiple ulcerations to multiple toes bilaterally and new wound to the right posterior heel. There is essentially no change in appearance, deteriorating measurements; he has a history of waxing and waning measurements. He admits that he continues to apply moisturizer/cream/ointment to his toes despite weekly reminders to only apply silvercel to his toes. He states that he thinks the surgical shoe contributed to the superficial ulcer to his posterior heel, he is unable to articulate if this was an area of dry cracked skin as he has a similar area to the left heel. He now is wearing open toed slippers. He has an appointment with Dr. Ola Spurr on 2/11. We have ordered x-rays for her bilateral feet; Travieso, Jamaris E. ([id number]) he  was advised to obtain the x-rays today or tomorrow. He will follow-up next week 05/23/17-he is here in follow-up evaluation for multiple ulcerations to multiple toes bilaterally and the right posterior heel. There is improvement in maceration. He has been compliant and not applying any moisturizing agent to his toes. He has been using Lac-Hydrin for his lower extremities with improvement. He did not go to his appointment on Monday with Dr. Ola Spurr secondary to financial  concerns. X-rays for her bilateral feet showed: LEFT FOOT with slight erosion of the tuft of the distal phalanges of the left first and second toe suspicious for osteomyelitis, RIGHT FOOT with 1.erosion of the tufts of the distal phalanges of the right first second and possibly third toes consistent with osteomyelitis, 2 no definitive abnormality of the calcaneus is seen on the images obtained, 3. Plantar calcaneal degenerative spur. We briefly discussed hyperbaric adjunctive therapy for treatment of chronic refractory osteomyelitis. I do not find an a1c in EMR, will contact PCP for record, or order if needed. He has been encouraged to contact Dr Ola Spurr office regarding the follow-up appointment, encouraged him to inquire about payment plan. We will continue with same treatment plan and follow-up next week. He states he is still taking antibiotics and has "a lot" left. He states he has been taking them as directed, 2 pills twice daily. According to Dr. Blane Ohara office notes he was originally started on 12/17 for 4 weeks and extended on 1/14 for an additional 4 weeks. He should be done with his antibiotic therapy, he was advised to bring his bottles and to his next appointment, we will contact pharmacy. 05/30/17-he is here in follow-up evaluation for multiple ulcerations to multiple toes bilaterally and the right posterior heel. He is accompanied by his brother-in-law. Wounds are stable. He has yet to make up with Dr.  Ola Spurr. We contacted his PCP, with no record of recent A1c we will draw an A1c. His brother-in-law states that he was taken off all of his diabetic medication secondary to kidney function. 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 The antibiotics were counted and have approximately 2 weeks left, although they should be complete. The pill organizer reveals missing days. We discussed the need for consistent medications, to have optimal benefit of medication. He has a cousin that lives with him and he will ask her to check his organizer daily. He has been advised to follow up with Dr Ola Spurr, and will go by the office today. He has been advised to quit smoking. 06/06/17-he is here in follow up evaluation. He has had to make an appointment with Dr. Ola Spurr. He did have blood work obtained, a1c 5. He continues to take antibiotic therapy. Significant improvement in bilateral lower extremity edema with compression therapy. Essentially no change in ulcerations to toes. He states he is "going to try something different" and "let me know next week" if it works; he would not provide any additional information and was encouraged to follow our orders. We will follow up next week 06/13/17-he is here in follow-up evaluation. He has an appointment with Dr. Ola Spurr tomorrow morning. He states he purchased an ointment from [facility] and applied to his toes for 3 days, he does not remember the name of the ointment. There is improvement to his wounds, minimal maceration. He continues to take antibiotic therapy, this should have been completed last month. His brother-in-law who regularly accompanies his appointments was asked to take the bottles to the appointment tomorrow with Dr. Ola Spurr so he is aware. We will continue with 3 layer compression, and order OPEN TOE compression 20-30mmHg; we will apply  compression stockings next week. He continues to smoke, smoked "2 cigarettes" last week 06/20/17 on evaluation today patient did receive his compression stockings which he has with him today for both lower extremities. With that being said he tells me at this point  in time that he is very happy to have these he really is not a big fan of the compression wraps that we have been utilizing although they have been of great benefit for him. Nonetheless at this point he does want to switch to the compression stockings. In my opinion as long as he is continuing with compression I'm okay with the stockings or the wraps. 06/27/17-he is here in follow-up evaluation for multiple ulcerations to his bilateral toes. There is some improvement in appearance. He is compliant in wearing his compression stockings with significant improvement in lower extremity edema. He saw Dr Ola Spurr on 3/8, per his notes they would redraw ESR and CRP; plan to continue antibiotic therapy if these remain elevated. I do not see an ESR or CRP level in Epic. The patient continues to take antibiotics. 07/11/17-He is here in follow-up evaluation for multiple ulcerations to multiple toes bilaterally. He presents with complete epithelialization to the right third toe; there has been no deterioration. He continues on antibiotic therapy. He will follow-up next week 07/18/17-He is here in follow-up evaluation for multiple ulcerations to multiple toes bilaterally. He continues to make improvement. He continues on antibiotic therapy. He states he has been using something additional to our orders, he does not elaborate but states he will bring it in next week. 07/25/17-He is here in follow up evaluation for multiple ulcerations to bilateral toes. He is stable. He has completed antibiotic therapy. He admits to "filing" his toes after showers each evening, this is what he was referencing last week; he does not filing for the wounds. We will switch to  [facility] and monitor for any improvement, he will follow-up next week 08/01/17-He is here in follow-up evaluation. He admits to "picking" at his toes after cleansing yesterday, leading to new areas of tissue loss on the bilateral second toe. There is improvement noted to the bilateral great toe. We will dress toes today and hope that they maintain until Monday where he will come in for a nurse visit. He has been advised, multiple times with expressed verbalization, to change the dressings to silvercel if the dressings get wet prior to [facility] appointment. 08/08/17-He is here in follow-up evaluation for bilateral first and second toe ulcerations. There is significant improvement to all ulcerations since last visit. We will switch to [facility] to all wounds and he will continue with nurse visits on a Rybka, Burl E. ([id number]) Monday/Thursday schedule and follow-up with me in 2 weeks. He continues to smoke, 1-3 cigarettes per day, and has been encouraged to not smoke until his next follow-up in 2 weeks. 08/15/17 on evaluation today patient's ulcers on his toes actually appear to be doing fairly well the right to ulcers may be a little bit more moist compared to the left I'm not really sure exactly why as the openings appear to be very small and I'm not seeing any evidence of anything significant as far as you lightest or otherwise. Nonetheless this may just be a small setback he's been doing very well with the [facility] Dressing 08/22/17-He is here in follow-up evaluation for ulcerations to his bilateral first and second toes, there is small/scant amount of drainage noted on today's dressing. He continues with Hydrofera Blue. He continues to smoke, 1 cigarette a day. Voices no complaint or concerns, compliant and compression therapy today. He'll follow-up next week 08/29/17-He is here in follow-up evaluation for ulcerations to his bilateral first and second toes, with a new wound to the  left third toe. He states he cut himself while cutting the toenail. He presents today with more maceration and increased measurements; waxing and waning measurements and moisture has been an ongoing issue. He continues to smoke a proximally 1 cigarette per day, but states he has not smoked since Sunday. We will initiate medihoney daily and evaluate next week. He presents today without compression stockings 09/05/17-He is here in follow-up evaluation for ulcerations to bilateral first and second and left third toe. He did not pick up the medihoney and therefore has not been changing his dressing. He admits to inconsistent where of compression stockings, admits to pain to his bilateral lower extremities with unilateral edema (right greater than left). The ulcerations to multiple toes are more macerated and larger in size than last week. He is wearing compression therapy today. He admits to an overall feeling of weakness and fatigue and has been encouraged to contact his PCP for evaluation; he has a known history of anemia and intraabdominal lymphadenopathy. We will return to silvercel and he will follow up next week 09/19/17-He is here in follow-up evaluation for ulcerations to bilateral first and second toe.he was unable to make last week's appointment. The culture that was obtained on 5/30 grew clindamycin sensitive MRSA; he was initiated on 6/4 but did not start it until 6/7. There is significant improvement in all wounds, healing to left second and third toe. He also admits to having no alcohol or smoking for the last 6 days. We will continue with same treatment plan I will extend the clindamycin for an additional 10 days and he will follow-up next week. 09/26/17- He is here in follow evaluation for multiple ulcerations to multiple toes bilaterally. He continues to be non-compliant with dressing, compression therapy. He states he is taking the antibiotics as prescribed, although his recall is  inconsistent. There is noted deterioration in all ulcers. We will continue same treatment plan, antibiotic therapy and he will follow up next week. 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 This information is obtained from the medical record, as he cannot articulate specifics to his diagnosis or treatment plan. 10/17/17-He is here in follow-up evaluation for multiple ulcerations to multiple toes bilaterally. There is chronic waxing and waning improvement/deterioration. On today's exam they appear improved, dry without maceration. He continues to apply a variety of topical agents, stating that he has switched between the blue product and silver product. In my opinion, this will be a chronic waxing and waning giving his intermittent compliance. He will follow-up in 2 weeks. As it relates to the biopsy taken from his buttock, he has not followed up with that and has not started radiation therapy. 10/31/17-He is here for evaluation for multiple ulcerations to multiple toes bilaterally. Continues with waxing and waning improvement versus deterioration. They are stable on today's exam without significant maceration. 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 We will continue with every 2 week follow-up appointments 11/14/17- He is seen in follow-up evaluation for multiple ulcerations to multiple bilateral toes. He is scheduled for excision of basal cell carcinoma to his right buttock on Monday 8/12. He continues to have chronic waxing and waning of his ulcers; today is a stable day with minimal drainage and left ear is better than right toes. I will recount to infectious  disease regarding chronic suppressive therapy. He admits to being more compliant with compression stockings, although he is not wearing  any today. He has been encouraged to continue with dressing changes as ordered and compression therapy daily. He will be seen in 2 weeks 11/28/17-He is seen in follow up evaluation for multiple ulcerations to multiple toes bilaterally. He had an overnight admission at [facility] (8/11-8/12) for symptomatic anemia, received two units prbc. His surgical date has been moved to 9/94 excision of basal cell carcinoma to the right buttock. He has been noncompliant in wearing compression stockings, applying topical treatment as ordered; he has been applying a "cream", Hydrofera Blue, silvercel. He is currently on no antibiotic therapy; we will send for re-referral to ID for suppressive therapy given his multiple co-morbidities, poor surgical candidate, unwilling to have toe amputation and general non-adherence. 12/19/17 on evaluation today patient actually appears to be doing very well in regard to his toes at least compared to last time I saw him. He again has been coming to our office for quite a bit of time. 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 He states is hurting a little bit but doing well in general. I'm see were not taking care of this area dermatology is. He does have his appointment with infectious disease upcoming which they re-consult for suppressive therapy that's next Monday. 01/02/18 on evaluation today patient actually appears to be doing rather well in regard to his toe ulcers although he tells me he Pevey, Erek E. ([id number]) has been put in an ointment on this which I am concerned may be causing some additional moisture buildup which we really do not want. He supposed be using silver cell which is mainly thing I want him to be utilizing. Fortunately there does not appear to be any evidence of worsening infection which is good news. He has had surgery on his gluteal  region where he had an excision of a cancerous area fortunately that seems to be doing well although he is having some discomfort. 02/06/18 on evaluation today patient actually appears to be doing well at all locations except for his second toe use bilaterally where the distal tip is still wrong open. He did state that he can't stand for dry skin to buildup on his toes subsequently as a question and further it sounds as if he actually pills this all which is likely keeping the toe is wrong and open. Also think that's what happened in the bottom of his foot although that appears to have healed at this point. There does not appear to be any evidence of infection. 02/20/18 on evaluation today patient appears to be doing better in regard to his foot ulcers. Everything has shown signs of improvement which is excellent news. Overall I'm very pleased with how things appear currently. The patient states he's not picking her point at any skin and that he wears shoes at all times when he is ambulating at home. Both are things that I've advocated and recommended for him in the past. 03/13/18 on evaluation today patient actually appears to be doing very well in regard to the bilateral toes in the overall appearance of the wound bed locations. Fortunately there does not appear to be any signs of infection at this time. He states that he is very pleased with how things seem to be progressing to be honest as am I.  03/27/1899 evaluation today patient appears to be doing well in regard to his toe ulcers. He just has a few areas remaining and these appear to be showing signs of improvement as well which is great news. Overall I have been impressed with the progress of the past several weeks. 04/10/18 on evaluation today patient appears to be doing a little bit worse in regard to the right plantar foot at the regional the first metatarsal as well as the left second toe. He tells me that he ran out of bandages he also  tells me he's been wearing his flip-flops a lot at home as well as a new pair of boots that he received as well. This is as opposed to the postop shoes that we had previously given him. Nonetheless overall I feel like that he has had a little bit more friction to the area which is causing things to look a little bit worse today. 04/24/18 on evaluation today patient's wounds for the most part appear to be doing excellent especially in regard to the bilateral second toes. With that being said he does have issues with the wound still on the first metatarsal region and first toe of the right foot which have not completely closed at this point. Nonetheless I do feel like that he is making progress which is good news. 05/08/18 on evaluation today patient's wounds appear to be doing just a little bit worse than normal simply due to the fact that he has not had dressing supplies. He apparently owed $35 in order for them to be shipped and unfortunately is not able to pay that currently. We did give him information for the [place] today which will hopefully help cover his dressing supplies. He was thankful for this he states is gonna contact them this afternoon. Fortunately otherwise or does not hear any signs of infection. 05/22/18 on evaluation today patient appears to be doing somewhat poorly in regard to his toes. Upon further questioning it appears that he actually has been pulling skin off of the toes which "drive him crazy". Subsequently as he does this he calls his new areas to tear open and I think this is one of the reasons why his toes are not healing as appropriately as we would like. He fortunately has no signs of infection at this time. With that being said I'm still concerned about the possibility of a fungal infection even though there doesn't appear to be anything bacterial. Potentially nystatin powder could be of benefit for him as far as trying to help this in that  regard. 06/05/18 on evaluation today patient actually appears to be doing very well in regard to his foot ulcers. Fortunately there's no signs of infection. He's been tolerating the dressing changes without complication. I'm very pleased with how things seem to be progressing. Fortunately there's no evidence of active infection at this point I do believe nystatin powder has been of benefit for him. 06/19/18 on evaluation today patient actually appears to be doing a little worse compared to last evaluation. Tells me that he has not had any supplies for two weeks. With that being said his plantar foot wounds on the right actually appear to be doing very well. It's actually a second toes in the first toes that are the worst. I think these are areas the was able to get to and pick and he tells me in fact that he destine to pick out some of the dry skin because it causes  him annoyance. Nonetheless I think this is somewhat detrimental to him I think it's worth keeping part of the wound open in particular. Fortunately there is no sign of infection at this time although I think that is a risk which I discussed specifically that he's not careful you can end up reopening this and causing greater problems for himself. DUWAN, ADRIAN ([id number]) Patient History Information obtained from Patient. Social History Current every day smoker, Marital Status - Widowed, Alcohol Use - Daily - quit drinking about a week ago, Drug Use - No History, Caffeine Use - Moderate. Medical History Eyes Denies history of Cataracts, Glaucoma, Optic Neuritis Ear/Nose/Mouth/Throat Denies history of Chronic sinus problems/congestion, Middle ear problems Hematologic/Lymphatic Patient has history of Anemia, Lymphedema Denies history of Hemophilia, Human Immunodeficiency Virus, Sickle Cell Disease Respiratory Denies history of Aspiration, Asthma, Chronic Obstructive Pulmonary Disease (COPD), Pneumothorax, Sleep  Apnea, Tuberculosis Cardiovascular Patient has history of Congestive Heart Failure, Hypertension, Peripheral Venous Disease Denies history of Angina, Arrhythmia, Coronary Artery Disease, Deep Vein Thrombosis, Hypotension, Myocardial Infarction, Peripheral Arterial Disease, Phlebitis, Vasculitis Gastrointestinal Denies history of Cirrhosis , Colitis, Crohn s, Hepatitis A, Hepatitis B, Hepatitis C Endocrine Patient has history of Type II Diabetes Genitourinary Denies history of End Stage Renal Disease Immunological Denies history of Lupus Erythematosus, Raynaud s, Scleroderma Integumentary (Skin) Denies history of History of Burn, History of pressure wounds Musculoskeletal Denies history of Gout, Rheumatoid Arthritis, Osteoarthritis, Osteomyelitis Neurologic Patient has history of Neuropathy Denies history of Dementia, Quadriplegia, Paraplegia, Seizure Disorder Medical And Surgical History Notes Oncologic 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 Review of Systems (ROS) Constitutional Symptoms (General Health) Denies complaints or symptoms of Fever, Chills. Respiratory The patient has no complaints or symptoms. Cardiovascular The patient has no complaints or symptoms. Psychiatric The patient has no complaints or symptoms. JUELZ, WHITTENBERG ([id number]) Objective Constitutional Well-nourished and well-hydrated in no acute distress. Vitals Time Taken: 8:38 AM, Height: 69 in, Weight: 168 lbs, BMI: 24.8, Temperature: 98.3 F, Pulse: 64 bpm, Respiratory Rate: 16 breaths/min, Blood Pressure: 156/71 mmHg. Respiratory normal breathing without difficulty. Psychiatric this patient is able to make decisions and demonstrates good insight into disease process. Alert and Oriented x 3. pleasant and cooperative. General Notes: Upon evaluation today patient's wound bed between the first and second toe on the right did require some pairing of the callous although I did not  have to actually debride the wound itself. What's unclear this way it actually appeared to be doing much better and definitely felt much better for the patient. In the future I told him that if he needs anything like this done I'll be happy to do this for him so that he does not have to pick up the area himself. Integumentary (Hair, Skin) Wound #14 status is Open. Original cause of wound was Gradually Appeared. The wound is located on the Left Toe Second. The wound measures 1.7cm length x 1.5cm width x 0.1cm depth; 2.003cm^2 area and 0.2cm^3 volume. There is Fat Layer (Subcutaneous Tissue) Exposed exposed. There is no tunneling or undermining noted. There is a medium amount of serosanguineous drainage noted. The wound margin is indistinct and nonvisible. There is small (1-33%) pink granulation within the wound bed. There is a large (67-100%) amount of necrotic tissue within the wound bed including Adherent Slough. The periwound skin appearance exhibited: Scarring, Dry/Scaly, Hemosiderin Staining. The periwound skin appearance did not exhibit: Callus, Crepitus, Excoriation, Induration, Rash, Maceration, Atrophie Blanche, Cyanosis, Ecchymosis, Mottled, Pallor, Rubor, Erythema. The  periwound has tenderness on palpation. Wound #16 status is Open. Original cause of wound was Not Known. The wound is located on the [employer]. The wound measures 0.6cm length x 1.6cm width x 0.1cm depth; 0.754cm^2 area and 0.075cm^3 volume. There is Fat Layer (Subcutaneous Tissue) Exposed exposed. There is no tunneling or undermining noted. There is a medium amount of serous drainage noted. The wound margin is flat and intact. There is large (67-100%) pink granulation within the wound bed. There is a small (1-33%) amount of necrotic tissue within the wound bed including Adherent Slough. The periwound skin appearance exhibited: Maceration. The periwound skin appearance did not exhibit: Callus, Crepitus,  Excoriation, Induration, Rash, Scarring, Dry/Scaly, Atrophie Blanche, Cyanosis, Ecchymosis, Hemosiderin Staining, Mottled, Pallor, Rubor, Erythema. Periwound temperature was noted as No Abnormality. Wound #17 status is Open. Original cause of wound was Trauma. The wound is located on the Right,Distal,Plantar Metatarsal head second. The wound measures 1cm length x 0.2cm width x 0.2cm depth; 0.157cm^2 area and 0.031cm^3 volume. There is Fat Layer (Subcutaneous Tissue) Exposed exposed. There is no tunneling or undermining noted. There is a medium amount of serous drainage noted. The wound margin is flat and intact. There is large (67-100%) pink granulation within the wound bed. There is a small (1-33%) amount of necrotic tissue within the wound bed including Adherent Slough. The periwound skin appearance exhibited: Maceration. The periwound skin appearance did not exhibit: Callus, Crepitus, Excoriation, Induration, Rash, Scarring, Dry/Scaly, Atrophie Blanche, Cyanosis, Ecchymosis, Hemosiderin Staining, Mottled, Pallor, Rubor, Erythema. Periwound temperature was noted as No Abnormality. Wound #18 status is Open. Original cause of wound was Gradually Appeared. The wound is located on the Right,Dorsal [employer] The wound measures 0.2cm length x 0.6cm width x 0.1cm depth; 0.094cm^2 area and 0.009cm^3 volume. There is no tunneling or undermining noted. There is a medium amount of serous drainage noted. The wound margin is flat and intact. There is large (67-100%) pink granulation within the wound bed. There is no necrotic tissue within the wound bed. The periwound skin appearance did not exhibit: Callus, Crepitus, Excoriation, Induration, Rash, Scarring, Dry/Scaly, Maceration, Atrophie Blanche, Cyanosis, Ecchymosis, Hemosiderin Staining, Mottled, Pallor, Rubor, Erythema. Periwound temperature Kats, Kyre E. ([id number]) was noted as No Abnormality. Wound #2 status is Open. Original cause of wound was  Gradually Appeared. The wound is located on the Right Toe Second. The wound measures 1cm length x 2.2cm width x 0.1cm depth; 1.728cm^2 area and 0.173cm^3 volume. The wound is limited to skin breakdown. There is no tunneling or undermining noted. There is a medium amount of serous drainage noted. The wound margin is indistinct and nonvisible. There is large (67-100%) pink granulation within the wound bed. There is no necrotic tissue within the wound bed. The periwound skin appearance did not exhibit: Callus, Crepitus, Excoriation, Induration, Rash, Scarring, Dry/Scaly, Maceration, Atrophie Blanche, Cyanosis, Ecchymosis, Hemosiderin Staining, Mottled, Pallor, Rubor, Erythema. Periwound temperature was noted as No Abnormality. 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 There was a Selective/Open Wound Non- Viable Tissue Debridement with a total area of 0.2 sq cm performed by STONE III, HOYT E., PA-C. With the following instrument(s): Curette to remove Non-Viable tissue/material. Material removed includes Callus after achieving pain control using Lidocaine. A time out was conducted at 09:07, prior to the start of the procedure. There was no bleeding. The procedure was tolerated  well. Post Debridement Measurements: 1cm length x 0.2cm width x 0.2cm depth; 0.031cm^3 volume. Character of Wound/Ulcer Post Debridement is stable. Severity of Tissue Post Debridement is: Fat layer exposed. Post procedure Diagnosis Wound #17: Same as Pre-Procedure Plan Wound Cleansing: Wound #14 Left Toe Second: Clean wound with Normal Saline. Wound #16 Right,Plantar Toe Great: Clean wound with Normal Saline. Wound #17 Right,Distal,Plantar Metatarsal head second: Daus, Joevanni E. ([id number]) Clean wound with Normal Saline. Wound #18 Right,Dorsal Toe Great: Clean wound with Normal Saline. Wound #2 Right Toe Second: Clean wound with Normal Saline. Anesthetic (add to Medication List): Wound #14 Left Toe Second: Topical Lidocaine 4% cream applied to wound bed prior to debridement (In Clinic Only). Wound #16 Right,Plantar Toe Great: Topical Lidocaine 4% cream applied to wound bed prior to debridement (In Clinic Only). Wound #17 Right,Distal,Plantar Metatarsal head second: Topical Lidocaine 4% cream applied to wound bed prior to debridement (In Clinic Only). Wound #18 Right,Dorsal Toe Great: Topical Lidocaine 4% cream applied to wound bed prior to debridement (In Clinic Only). Wound #2 Right Toe Second: Topical Lidocaine 4% cream applied to wound bed prior to debridement (In Clinic Only). Skin Barriers/Peri-Wound Care: Wound #14 Left Toe Second: Antifungal powder-Nystatin Wound #16 Right,Plantar Toe Great: Antifungal powder-Nystatin Wound #17 Right,Distal,Plantar Metatarsal head second: Antifungal powder-Nystatin Wound #18 Right,Dorsal Toe Great: Antifungal powder-Nystatin Wound #2 Right Toe Second: Antifungal powder-Nystatin Primary Wound Dressing: Wound #14 Left Toe Second: Silver Alginate - secure with tape Wound #16 Right,Plantar Toe Great: Silver Alginate - secure with tape Wound #17 Right,Distal,Plantar Metatarsal head second: Silver Alginate - secure with tape Wound #18  Right,Dorsal Toe Great: Silver Alginate - secure with tape Wound #2 Right Toe Second: Silver Alginate - secure with tape Dressing Change Frequency: Wound #14 Left Toe Second: Change dressing every day. Wound #16 Right,Plantar Toe Great: Change dressing every day. Wound #17 Right,Distal,Plantar Metatarsal head second: Change dressing every day. Wound #18 Right,Dorsal Toe Great: Change dressing every day. Wound #2 Right Toe Second: Change dressing every day. Follow-up Appointments: Wound #14 Left Toe Second: Return Appointment in 2 weeks. Wound #16 Right,Plantar Toe Great: Return Appointment in 2 weeks. Wound #17 Right,Distal,Plantar Metatarsal head second: Return Appointment in 2 weeks. Wound #18 Right,Dorsal Toe Great: Return Appointment in 2 weeks. Wound #2 Right Toe Second: Return Appointment in 2 weeks. JODEN, BONSALL ([id number]) Edema Control: Wound #14 Left Toe Second: Patient to wear own compression stockings Wound #16 Right,Plantar Toe Great: Patient to wear own compression stockings Wound #17 Right,Distal,Plantar Metatarsal head second: Patient to wear own compression stockings Wound #18 Right,Dorsal Toe Great: Patient to wear own compression stockings Wound #2 Right Toe Second: Patient to wear own compression stockings Additional Orders / Instructions: Wound #14 Left Toe Second: Stop Smoking Increase protein intake. Wound #16 Right,Plantar Toe Great: Stop Smoking Increase protein intake. Wound #17 Right,Distal,Plantar Metatarsal head second: Stop Smoking Increase protein intake. Wound #18 Right,Dorsal Toe Great: Stop Smoking Increase protein intake. Wound #2 Right Toe Second: Stop Smoking Increase protein intake. Medications-please add to medication list.: Wound #14 Left Toe Second: Other: -  Nystatin Powder Wound #16 Right,Plantar Toe Great: Other: - Nystatin Powder Wound #17 Right,Distal,Plantar Metatarsal head second: Other: - Nystatin  Powder Wound #18 Right,Dorsal Toe Great: Other: - Nystatin Powder Wound #2 Right Toe Second: Other: - Nystatin Powder Overall I feel like he is doing better as far as the overall appearance of the wound bed is concerned at this point. This is pretty much all locations except for the bilateral second toes. And these are areas where he tends to pick up the dry skin causing damage to the area. If you can avoid doing this I think things will heal and do well. We are gonna go ahead and see about ordering supplies today as well. Please see above for specific wound care orders. We will see patient for re-evaluation in 1 week(s) here in the clinic. If anything worsens or changes patient will contact our office for additional recommendations. Electronic Signature(s) Signed: 06/19/2018 3:26:18 PM By: Worthy Keeler PA-C Entered By: Worthy Keeler on 06/19/2018 10:07:05 Prete, [name] ([id number]) -------------------------------------------------------------------------------- ROS/PFSH Details Patient Name: Furniss, Skylier E. Date of Service: 06/19/2018 8:30 AM Medical Record Number: [id number] Patient Account Number: [id number] Date of Birth/Sex: 11-26-1950 (68 y.o. M) Treating RN: Army Melia Primary Care Provider: Lamonte Sakai Other Clinician: Referring Provider: Lamonte Sakai Treating Provider/Extender: Melburn Hake, HOYT Weeks in Treatment: 74 Information Obtained From Patient Wound History Do you currently have one or more open woundso Yes How many open wounds do you currently haveo 6 Approximately how long have you had your woundso 3 months How have you been treating your wound(s) until nowo ointment and bandage Has your wound(s) ever healed and then re-openedo No Have you had any lab work done in the past montho No Have you tested positive for an antibiotic resistant organism (MRSA, VRE)o No Have you tested positive for osteomyelitis (bone infection)o No Have you had any tests for  circulation on your legso Yes Who ordered the testo PCP Where was the test doneo [facility] Constitutional Symptoms (General Health) Complaints and Symptoms: Negative for: Fever; Chills Eyes Medical History: Negative for: Cataracts; Glaucoma; Optic Neuritis Ear/Nose/Mouth/Throat Medical History: Negative for: Chronic sinus problems/congestion; Middle ear problems Hematologic/Lymphatic Medical History: Positive for: Anemia; Lymphedema Negative for: Hemophilia; Human Immunodeficiency Virus; Sickle Cell Disease Respiratory Complaints and Symptoms: No Complaints or Symptoms Medical History: Negative for: Aspiration; Asthma; Chronic Obstructive Pulmonary Disease (COPD); Pneumothorax; Sleep Apnea; Tuberculosis Cardiovascular Complaints and Symptoms: No Complaints or Symptoms Hatlestad, Makel E. ([id number]) Medical History: Positive for: Congestive Heart Failure; Hypertension; Peripheral Venous Disease Negative for: Angina; Arrhythmia; Coronary Artery Disease; Deep Vein Thrombosis; Hypotension; Myocardial Infarction; Peripheral Arterial Disease; Phlebitis; Vasculitis Gastrointestinal Medical History: Negative for: Cirrhosis ; Colitis; Crohnos; Hepatitis A; Hepatitis B; Hepatitis C Endocrine Medical History: Positive for: Type II Diabetes Treated with: Oral agents Blood sugar tested every day: Yes Tested : QD Genitourinary Medical History: Negative for: End Stage Renal Disease Immunological Medical History: Negative for: Lupus Erythematosus; Raynaudos; Scleroderma Integumentary (Skin) Medical History: Negative for: History of Burn; History of pressure wounds Musculoskeletal Medical History: Negative for: Gout; Rheumatoid Arthritis; Osteoarthritis; Osteomyelitis Neurologic Medical History: Positive for: Neuropathy Negative for: Dementia; Quadriplegia; Paraplegia; Seizure Disorder Oncologic Medical History: Past Medical History Notes: 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 ([id number]) Immunization Notes: up to date Implantable Devices No devices added Family and Social History Current every day smoker; Marital Status -  Widowed; Alcohol Use: Daily - quit drinking about a week ago; Drug Use: No History; Caffeine Use: Moderate; Financial Concerns: No; Food, Clothing or Shelter Needs: No; Support System Lacking: No; Transportation Concerns: No; Advanced Directives: No; Patient does not want information on Advanced Directives Physician Affirmation I have reviewed and agree with the above information. Electronic Signature(s) Signed: 06/19/2018 3:26:18 PM By: Worthy Keeler PA-C Signed: 06/20/2018 3:17:15 PM By: Army Melia Entered By: Worthy Keeler on 06/19/2018 10:06:23 Navis, [name] ([id number]) -------------------------------------------------------------------------------- SuperBill Details Patient Name: Salamone, Ananth E. Date of Service: 06/19/2018 Medical Record Number: [id number] Patient Account Number: [id number] Date of Birth/Sex: Jan 05, 1951 (68 y.o. 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 I89.0 Lymphedema, not elsewhere classified F17.218 Nicotine dependence, cigarettes, 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 abuse with unspecified alcohol-induced  disorder M86.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, right ankle and foot Facility Procedures CPT4 Code Description: [id number] - DEBRIDE WOUND 1ST 20 SQ CM OR < ICD-10 Diagnosis Description L97.511 Non-pressure chronic ulcer of other part of right foot limited t Modifier: o breakdown of Quantity: 1 skin Physician Procedures CPT4 Code Description: [id number] [id number] - WC PHYS DEBR WO ANESTH 20 SQ CM ICD-10 Diagnosis Description L97.511 Non-pressure chronic ulcer of other part of right foot limited to Modifier: breakdown of Quantity: 1 skin Electronic Signature(s) Signed: 06/19/2018 3:26:18 PM By: Worthy Keeler PA-C Entered By: Worthy Keeler on 06/19/2018 10:07:16

## 2018-06-21 NOTE — Progress Notes (Signed)
CHAPIN, ARDUINI ([id number]) Visit Report for 06/19/2018 Arrival Information Details Patient Name: [name], [name] Date of Service: 06/19/2018 8:30 AM Medical Record Number: [id number] Patient Account Number: [id number] Date of Birth/Sex: 10-09-1950 (68 y.o. M) Treating RN: Army Melia Primary Care [name]: [name] Other Clinician: Referring [name]: [name] Treating [name]/Extender: [name], [name] in Treatment: 95 Visit Information History Since Last Visit Added or deleted any medications: Yes Patient Arrived: Ambulatory Any new allergies or adverse reactions: No Arrival Time: 08:37 Had a fall or experienced change in No Accompanied By: brother in activities of daily living that may affect law risk of falls: Transfer Assistance: None Signs or symptoms of abuse/neglect since last visito No Patient Identification Verified: Yes Hospitalized since last visit: No Secondary Verification Process Completed: Yes Implantable device outside of the clinic excluding No Patient Requires Transmission-Based No cellular tissue based products placed in the center Precautions: since last visit: Patient Has Alerts: Yes Has Dressing in Place as Prescribed: Yes Patient Alerts: DMII Pain Present Now: No Electronic Signature(s) Signed: 06/19/2018 3:01:47 PM By: [name] RCP, RRT, CHT Entered By: [name] on 06/19/2018 08:38:08 [name], [name] ([id number]) -------------------------------------------------------------------------------- Encounter Discharge Information Details Patient Name: [name], [name] Date of Service: 06/19/2018 8:30 AM Medical Record Number: [id number] Patient Account Number: [id number] Date of Birth/Sex: 04-20-1950 (68 y.o. M) Treating RN: [name] Primary Care [name]: [name] Other Clinician: Referring Dax Murguia: [name] Treating [name]/Extender: [name], [name] in Treatment: 63 Encounter  Discharge Information Items Post Procedure Vitals Discharge Condition: Stable Temperature (F): 98.3 Ambulatory Status: Ambulatory Pulse (bpm): 64 Discharge Destination: Home Respiratory Rate (breaths/min): 16 Transportation: Private Auto Blood Pressure (mmHg): 156/71 Accompanied By: friend Schedule Follow-up Appointment: Yes Clinical Summary of Care: Electronic Signature(s) Signed: 06/20/2018 8:23:32 AM By: [name] Entered By: [name] on 06/19/2018 09:22:56 Gasior, [name] ([id number]) -------------------------------------------------------------------------------- Lower Extremity Assessment Details Patient Name: [name], [name] Date of Service: 06/19/2018 8:30 AM Medical Record Number: [id number] Patient Account Number: [id number] Date of Birth/Sex: 21-Oct-1950 (68 y.o. M) Treating RN: [name] Primary Care Ciarra Braddy: [name] Other Clinician: Referring [name]: [name] Treating [name]/Extender: [name], [name] in Treatment: 91 Vascular Assessment Pulses: Dorsalis Pedis Palpable: [Left:Yes] [Right:Yes] Posterior Tibial Palpable: [Left:Yes] [Right:Yes] Extremity colors, hair growth, and conditions: Extremity Color: [Left:Hyperpigmented] [Right:Hyperpigmented] Hair Growth on Extremity: [Left:No] [Right:No] Temperature of Extremity: [Left:Warm] [Right:Warm] Capillary Refill: [Left:< 3 seconds] [Right:< 3 seconds] Toe Nail Assessment Left: Right: Thick: Yes Yes Discolored: Yes Yes Deformed: Yes Yes Improper Length and Hygiene: No No Electronic Signature(s) Signed: 06/19/2018 2:44:29 PM By: [name] Entered By: [name] on 06/19/2018 08:55:17 [name], [name] ([id number]) -------------------------------------------------------------------------------- Multi Wound Chart Details Patient Name: [name], [name] Date of Service: 06/19/2018 8:30 AM Medical Record Number: [id number] Patient Account Number: [id number] Date of  Birth/Sex: Jun 02, 1950 (68 y.o. 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 Lower Extremity Lower Extremity Comorbid History: Anemia, Lymphedema, Anemia, Lymphedema, Anemia, Lymphedema, Congestive Heart Failure, Congestive Heart Failure, Congestive  Heart Failure, Hypertension, Peripheral Hypertension, Peripheral Hypertension, Peripheral Venous Disease, Type II Venous Disease, Type II Venous Disease, Type II Diabetes, Neuropathy Diabetes, Neuropathy Diabetes, Neuropathy Date Acquired: 09/26/2017 10/29/2017 02/03/2018 [name] of Treatment: 38 33 19 Wound Status: Open Open Open Pending Amputation on No No No Presentation: Measurements L x W x D 1.7x1.5x0.1 0.6x1.6x0.1 1x0.2x0.2 (cm) Area (cm) : 2.003 0.754 0.157 Volume (cm) : 0.2 0.075 0.031 % Reduction in Area: 15.00% 52.90% 55.50% % Reduction in Volume: 15.30% 53.10% 11.40% Classification: Grade 1 Grade 1 Grade 1 Exudate Amount: Medium Medium Medium Exudate Type: Serosanguineous Serous Serous Exudate Color: red, brown amber amber Wound Margin: Indistinct, nonvisible Flat and Intact Flat and Intact Granulation Amount: Small (1-33%) Large (67-100%) Large (67-100%) Granulation Quality: Pink Pink Pink Necrotic Amount: Large (67-100%) Small (1-33%) Small (1-33%) [name], [name] ([id number]) Exposed Structures: Fat  Layer (Subcutaneous Fat Layer (Subcutaneous Fat Layer (Subcutaneous Tissue) Exposed: Yes Tissue) Exposed: Yes Tissue) Exposed: Yes Fascia: No Fascia: No Fascia: No Tendon: No Tendon: No Tendon: No Muscle: No Muscle: No Muscle: No Joint: No Joint: No Joint: No Bone: No Bone: No Bone: No Epithelialization: None Small (1-33%) Medium (34-66%) Periwound Skin Texture: Scarring: Yes Excoriation: No Excoriation: No Excoriation: No Induration: No Induration: No Induration: No Callus: No Callus: No Callus: No Crepitus: No Crepitus: No Crepitus: No Rash: No Rash: No Rash: No Scarring: No Scarring: No Periwound Skin Moisture: Dry/Scaly: Yes Maceration: Yes Maceration: Yes Maceration: No Dry/Scaly: No Dry/Scaly: No Periwound Skin Color: Hemosiderin Staining: Yes Atrophie Blanche: No Atrophie Blanche: No Atrophie Blanche: No Cyanosis: No Cyanosis: No Cyanosis: No Ecchymosis: No Ecchymosis: No Ecchymosis: No Erythema: No Erythema: No Erythema: No Hemosiderin Staining: No Hemosiderin Staining: No Mottled: No Mottled: No Mottled: No Pallor: No Pallor: No Pallor: No Rubor: No Rubor: No Rubor: No Temperature: N/A No Abnormality No Abnormality Tenderness on Palpation: Yes No No Wound Preparation: Ulcer Cleansing: Ulcer Cleansing: Ulcer Cleansing: Rinsed/Irrigated with Saline Rinsed/Irrigated with Saline Rinsed/Irrigated with Saline Topical Anesthetic Applied: Topical Anesthetic Applied: Topical Anesthetic Applied: Other: lidocaine 4% Other: lidocaine 4% Other: lidocaine 4% Wound Number: [id number] N/A Photos: No Photos N/A Wound Location: Right Toe Great - Dorsal Right Toe Second N/A Wounding Event: Gradually Appeared Gradually Appeared N/A Primary Etiology: Diabetic Wound/Ulcer of the Diabetic Wound/Ulcer of the N/A Lower Extremity Lower Extremity Comorbid History: Anemia, Lymphedema, Anemia, Lymphedema, N/A Congestive Heart Failure, Congestive Heart  Failure, Hypertension, Peripheral Hypertension, Peripheral Venous Disease, Type II Venous Disease, Type II Diabetes, Neuropathy Diabetes, Neuropathy Date Acquired: 05/22/2018 06/11/2016 N/A [name] of Treatment: 4 90 N/A Wound Status: Open Open N/A Pending Amputation on No Yes N/A Presentation: Measurements L x W x D 0.2x0.6x0.1 1x2.2x0.1 N/A (cm) Area (cm) : 0.094 1.728 N/A Revoir, Erin E. ([id number]) Volume (cm) : 0.009 0.173 N/A % Reduction in Area: 88.30% 43.70% N/A % Reduction in Volume: 88.80% 43.60% N/A Classification: Grade 1 Grade 2 N/A Exudate Amount: Medium Medium N/A Exudate Type: Serous Serous N/A Exudate Color: amber amber N/A Wound Margin: Flat and Intact Indistinct, nonvisible N/A Granulation Amount: Large (67-100%) Large (67-100%) N/A Granulation Quality: Pink Pink N/A Necrotic Amount: None Present (0%) None Present (0%) N/A Exposed Structures: Fascia: No Fascia: No N/A Fat Layer (Subcutaneous Fat Layer (Subcutaneous Tissue) Exposed: No Tissue) Exposed: No Tendon: No Tendon: No Muscle: No Muscle: No Joint: No Joint: No Bone: No Bone: No Limited to Skin Breakdown Epithelialization: None Large (67-100%) N/A Periwound Skin Texture: Excoriation: No Excoriation: No N/A Induration: No Induration: No Callus:  No Callus: No Crepitus: No Crepitus: No Rash: No Rash: No Scarring: No Scarring: No Periwound Skin Moisture: Maceration: No Maceration: No N/A Dry/Scaly: No Dry/Scaly: No Periwound Skin Color: Atrophie Blanche: No Atrophie Blanche: No N/A Cyanosis: No Cyanosis: No Ecchymosis: No Ecchymosis: No Erythema: No Erythema: No Hemosiderin Staining: No Hemosiderin Staining: No Mottled: No Mottled: No Pallor: No Pallor: No Rubor: No Rubor: No Temperature: No Abnormality No Abnormality N/A Tenderness on Palpation: No No N/A Wound Preparation: Ulcer Cleansing: Ulcer Cleansing: N/A Rinsed/Irrigated with Saline Rinsed/Irrigated with  Saline Topical Anesthetic Applied: Topical Anesthetic Applied: None None Treatment Notes Electronic Signature(s) Signed: 06/20/2018 3:17:15 PM By: Army Melia Entered By: Army Melia on 06/19/2018 09:05:07 Palleschi, [name] ([id number]) -------------------------------------------------------------------------------- [place] Details Patient Name: Keizer, Marten E. Date of Service: 06/19/2018 8:30 AM Medical Record Number: [id number] Patient Account Number: [id number] Date of Birth/Sex: 1950/07/23 (68 y.o. M) Treating RN: Army Melia Primary Care Anshul Meddings: [name] Other Clinician: Referring Joshuajames Moehring: [name] Treating Kadra Kohan/Extender: [name], [name] in Treatment: 66 Active Inactive Abuse / Safety / Falls / Self Care Management Nursing Diagnoses: Potential for falls Goals: Patient will remain injury free related to falls Date Initiated: 09/21/2016 Target Resolution Date: 09/14/2017 Goal Status: Active Interventions: Assess fall risk on admission and as needed Notes: Nutrition Nursing Diagnoses: Potential for alteratiion in Nutrition/Potential for imbalanced nutrition Goals: Patient/caregiver agrees to and verbalizes understanding of need to use nutritional supplements and/or vitamins as prescribed Date Initiated: 09/21/2016 Target Resolution Date: 09/14/2017 Goal Status: Active Interventions: Assess patient nutrition upon admission and as needed per policy Notes: Orientation to the Wound Care Program Nursing Diagnoses: Knowledge deficit related to the wound healing center program Goals: Patient/caregiver will verbalize understanding of the [facility] Date Initiated: 09/21/2016 Target Resolution Date: 06/15/2017 Goal Status: Active Interventions: Provide education on orientation to the wound center Reis, Givanni E. ([id number]) Notes: Wound/Skin Impairment Nursing Diagnoses: Knowledge deficit related to smoking impact on  wound healing Goals: Ulcer/skin breakdown will have a volume reduction of 30% by week 4 Date Initiated: 09/21/2016 Target Resolution Date: 08/17/2017 Goal Status: Active Ulcer/skin breakdown will have a volume reduction of 50% by week 8 Date Initiated: 09/21/2016 Target Resolution Date: 08/17/2017 Goal Status: Active Ulcer/skin breakdown will have a volume reduction of 80% by week 12 Date Initiated: 09/21/2016 Target Resolution Date: 09/14/2017 Goal Status: Active Ulcer/skin breakdown will heal within 14 [name] Date Initiated: 09/21/2016 Target Resolution Date: 08/17/2017 Goal Status: Active Interventions: Assess patient/caregiver ability to obtain necessary supplies Assess patient/caregiver ability to perform ulcer/skin care regimen upon admission and as needed Assess ulceration(s) every visit Notes: Electronic Signature(s) Signed: 06/20/2018 3:17:15 PM By: Army Melia Entered By: Army Melia on 06/19/2018 09:04:51 Damico, [name] ([id number]) -------------------------------------------------------------------------------- Pain Assessment Details Patient Name: Shelden, Lindwood E. Date of Service: 06/19/2018 8:30 AM Medical Record Number: [id number] Patient Account Number: [id number] Date of Birth/Sex: January 29, 1951 (68 y.o. M) Treating RN: Army Melia Primary Care Davionne Mastrangelo: [name] Other Clinician: Referring Ranelle Auker: [name] Treating Sunil Hue/Extender: [name], [name] in Treatment: 16 Active Problems Location of Pain Severity and Description of Pain Patient Has Paino No Site Locations Pain Management and Medication Current Pain Management: Electronic Signature(s) Signed: 06/19/2018 3:01:47 PM By: Paulla Fore, RRT, CHT Signed: 06/20/2018 3:17:15 PM By: Army Melia Entered By: [name] on 06/19/2018 08:38:16 Giannetti, [name]  ([id number]) -------------------------------------------------------------------------------- Patient/Caregiver Education Details Patient Name: Marcell, Raj E. Date of Service: 06/19/2018 8:30 AM Medical Record Number: [id number] Patient Account  Number: [id number] Date of Birth/Gender: 06-Feb-1951 (68 y.o. M) Treating RN: Army Melia Primary Care Physician: [name] Other Clinician: Referring Physician: Lamonte [name] Treating Physician/Extender: Sharalyn Ink in Treatment: 27 Education Assessment Education Provided To: Patient Education Topics Provided Smoking and Wound Healing: Handouts: Smoking and Wound Healing Methods: Demonstration, Explain/Verbal Responses: State content correctly Wound/Skin Impairment: Handouts: Caring for Your Ulcer Methods: Demonstration, Explain/Verbal Responses: State content correctly Electronic Signature(s) Signed: 06/20/2018 8:23:32 AM By: [name] Entered By: [name] on 06/19/2018 09:23:02 Thaden, [place] E. ([id number]) -------------------------------------------------------------------------------- Wound Assessment Details Patient Name: Coddington, Nevyn E. Date of Service: 06/19/2018 8:30 AM Medical Record Number: [id number] Patient Account Number: [id number] Date of Birth/Sex: 09/21/1950 (68 y.o. M) Treating RN: [name] Primary Care Kordelia Severin: [name] Other Clinician: Referring Montrice Montuori: [name] Treating Kaira Stringfield/Extender: [name], [name] in Treatment: 90 Wound Status Wound Number: 14 Primary Diabetic Wound/Ulcer of the Lower Extremity Etiology: Wound Location: Left Toe Second Wound Open Wounding Event: Gradually Appeared Status: Date Acquired: 09/26/2017 Comorbid Anemia, Lymphedema, Congestive Heart [name] Of Treatment: 38 History: Failure, Hypertension, Peripheral Venous Clustered Wound: No Disease, Type II Diabetes, Neuropathy Photos Wound Measurements Length: (cm) 1.7 % Reduction i Width: (cm)  1.5 % Reduction i Depth: (cm) 0.1 Epithelializa Area: (cm) 2.003 Tunneling: Volume: (cm) 0.2 Undermining: n Area: 15% n Volume: 15.3% tion: None No No Wound Description Classification: Grade 1 Foul Odor Af Wound Margin: Indistinct, nonvisible Slough/Fibri Exudate Amount: Medium Exudate Type: Serosanguineous Exudate Color: red, brown ter Cleansing: No no Yes Wound Bed Granulation Amount: Small (1-33%) Exposed Structure Granulation Quality: Pink Fascia Exposed: No Necrotic Amount: Large (67-100%) Fat Layer (Subcutaneous Tissue) Exposed: Yes Necrotic Quality: Adherent Slough Tendon Exposed: No Muscle Exposed: No Joint Exposed: No Bone Exposed: No Periwound Skin Texture Texture Color [name], [name] ([id number]) No Abnormalities Noted: No No Abnormalities Noted: No Callus: No Atrophie Blanche: No Crepitus: No Cyanosis: No Excoriation: No Ecchymosis: No Induration: No Erythema: No Rash: No Hemosiderin Staining: Yes Scarring: Yes Mottled: No Pallor: No Moisture Rubor: No No Abnormalities Noted: No Dry / Scaly: Yes Temperature / Pain Maceration: No Tenderness on Palpation: Yes Wound Preparation Ulcer Cleansing: Rinsed/Irrigated with Saline Topical Anesthetic Applied: Other: lidocaine 4%, Electronic Signature(s) Signed: 06/19/2018 2:44:29 PM By: [name] Entered By: [name] on 06/19/2018 08:58:21 Strine, [name] ([id number]) -------------------------------------------------------------------------------- Wound Assessment Details Patient Name: [name], Master E. Date of Service: 06/19/2018 8:30 AM Medical Record Number: [id number] Patient Account Number: [id number] Date of Birth/Sex: 12-30-1950 (68 y.o. M) Treating RN: [name] Primary Care Shlomo Seres: [name] Other Clinician: Referring Anabelle Bungert: [name] Treating Amorita Vanrossum/Extender: [name], [name] in Treatment: 90 Wound Status Wound Number: 16 Primary Diabetic Wound/Ulcer of  the Lower Extremity Etiology: Wound Location: Right Toe Great - Plantar Wound Open Wounding Event: Not Known Status: Date Acquired: 10/29/2017 Comorbid Anemia, Lymphedema, Congestive Heart [name] Of Treatment: 33 History: Failure, Hypertension, Peripheral Venous Clustered Wound: No Disease, Type II Diabetes, Neuropathy Photos Photo Uploaded By: [name] on 06/19/2018 09:39:13 Wound Measurements Length: (cm) 0.6 Width: (cm) 1.6 Depth: (cm) 0.1 Area: (cm) 0.754 Volume: (cm) 0.075 % Reduction in Area: 52.9% % Reduction in Volume: 53.1% Epithelialization: Small (1-33%) Tunneling: No Undermining: No Wound Description Classification: Grade 1 Wound Margin: Flat and Intact Exudate Amount: Medium Exudate Type: Serous Exudate Color: amber Foul Odor After Cleansing: No Slough/Fibrino Yes Wound Bed Granulation Amount: Large (67-100%) Exposed Structure Granulation Quality: Pink Fascia Exposed: No Necrotic Amount: Small (1-33%) Fat Layer (Subcutaneous Tissue) Exposed: Yes  Necrotic Quality: Adherent Slough Tendon Exposed: No Muscle Exposed: No Joint Exposed: No Bone Exposed: No Periwound Skin Texture [name], [name] ([id number]) Texture Color No Abnormalities Noted: No No Abnormalities Noted: No Callus: No Atrophie Blanche: No Crepitus: No Cyanosis: No Excoriation: No Ecchymosis: No Induration: No Erythema: No Rash: No Hemosiderin Staining: No Scarring: No Mottled: No Pallor: No Moisture Rubor: No No Abnormalities Noted: No Dry / Scaly: No Temperature / Pain Maceration: Yes Temperature: No Abnormality Wound Preparation Ulcer Cleansing: Rinsed/Irrigated with Saline Topical Anesthetic Applied: Other: lidocaine 4%, Treatment Notes Wound #16 (Right, Plantar Toe Great) Notes silver alginate, gauze, medipore tape Electronic Signature(s) Signed: 06/19/2018 2:44:29 PM By: [name] Entered By: [name] on 06/19/2018 08:48:21 Bradway, Lion E.  ([id number]) -------------------------------------------------------------------------------- Wound Assessment Details Patient Name: Nomura, [name] Date of Service: 06/19/2018 8:30 AM Medical Record Number: [id number] Patient Account Number: [id number] Date of Birth/Sex: Jul 16, 1950 (68 y.o. M) Treating RN: [name] Primary Care Lowell Mcgurk: [name] Other Clinician: Referring Skylar Flynt: [name] Treating Leighton Brickley/Extender: [name], [name] in Treatment: 90 Wound Status Wound Number: 17 Primary Diabetic Wound/Ulcer of the Lower Extremity Etiology: Wound Location: Right Metatarsal head second - Plantar, Distal Wound Open Status: Wounding Event: Trauma Comorbid Anemia, Lymphedema, Congestive Heart Date Acquired: 02/03/2018 History: Failure, Hypertension, Peripheral Venous [name] Of Treatment: 19 Disease, Type II Diabetes, Neuropathy Clustered Wound: No Photos Photo Uploaded By: [name] on 06/19/2018 09:39:37 Wound Measurements Length: (cm) 1 Width: (cm) 0.2 Depth: (cm) 0.2 Area: (cm) 0.157 Volume: (cm) 0.031 % Reduction in Area: 55.5% % Reduction in Volume: 11.4% Epithelialization: Medium (34-66%) Tunneling: No Undermining: No Wound Description Classification: Grade 1 Wound Margin: Flat and Intact Exudate Amount: Medium Exudate Type: Serous Exudate Color: amber Foul Odor After Cleansing: No Slough/Fibrino Yes Wound Bed Granulation Amount: Large (67-100%) Exposed Structure Granulation Quality: Pink Fascia Exposed: No Necrotic Amount: Small (1-33%) Fat Layer (Subcutaneous Tissue) Exposed: Yes Necrotic Quality: Adherent Slough Tendon Exposed: No Muscle Exposed: No Joint Exposed: No Bone Exposed: No Periwound Skin Texture [name], [name] ([id number]) Texture Color No Abnormalities Noted: No No Abnormalities Noted: No Callus: No Atrophie Blanche: No Crepitus: No Cyanosis: No Excoriation: No Ecchymosis: No Induration: No Erythema:  No Rash: No Hemosiderin Staining: No Scarring: No Mottled: No Pallor: No Moisture Rubor: No No Abnormalities Noted: No Dry / Scaly: No Temperature / Pain Maceration: Yes Temperature: No Abnormality Wound Preparation Ulcer Cleansing: Rinsed/Irrigated with Saline Topical Anesthetic Applied: Other: lidocaine 4%, Treatment Notes Wound #17 (Right, Distal, Plantar Metatarsal head second) Notes silver alginate, gauze, medipore tape Electronic Signature(s) Signed: 06/19/2018 2:44:29 PM By: [name] Entered By: [name] on 06/19/2018 08:49:21 [place], [name] ([id number]) -------------------------------------------------------------------------------- Wound Assessment Details Patient Name: [name], [name] Date of Service: 06/19/2018 8:30 AM Medical Record Number: [id number] Patient Account Number: [id number] Date of Birth/Sex: 06/29/1950 (68 y.o. M) Treating RN: [name] Primary Care Jerimyah Vandunk: [name] Other Clinician: Referring Newell Frater: [name] Treating Hind Chesler/Extender: [name], [name] in Treatment: 90 Wound Status Wound Number: 18 Primary Diabetic Wound/Ulcer of the Lower Extremity Etiology: Wound Location: Right Toe Great - Dorsal Wound Open Wounding Event: Gradually Appeared Status: Date Acquired: 05/22/2018 Comorbid Anemia, Lymphedema, Congestive Heart [name] Of Treatment: 4 History: Failure, Hypertension, Peripheral Venous Clustered Wound: No Disease, Type II Diabetes, Neuropathy Photos Wound Measurements Length: (cm) 0.2 % Reduction in Width: (cm) 0.6 % Reduction in Depth: (cm) 0.1 Epithelializat Area: (cm) 0.094 Tunneling: Volume: (cm) 0.009 Undermining: Area: 88.3% Volume: 88.8% ion: None No No Wound  Description Classification: Grade 1 Foul Odor Aft Wound Margin: Flat and Intact Slough/Fibrin Exudate Amount: Medium Exudate Type: Serous Exudate Color: amber er Cleansing: No o No Wound Bed Granulation Amount: Large  (67-100%) Exposed Structure Granulation Quality: Pink Fascia Exposed: No Necrotic Amount: None Present (0%) Fat Layer (Subcutaneous Tissue) Exposed: No Tendon Exposed: No Muscle Exposed: No Joint Exposed: No Bone Exposed: No Periwound Skin Texture Texture Color Nazari, Nikolos E. ([id number]) No Abnormalities Noted: No No Abnormalities Noted: No Callus: No Atrophie Blanche: No Crepitus: No Cyanosis: No Excoriation: No Ecchymosis: No Induration: No Erythema: No Rash: No Hemosiderin Staining: No Scarring: No Mottled: No Pallor: No Moisture Rubor: No No Abnormalities Noted: No Dry / Scaly: No Temperature / Pain Maceration: No Temperature: No Abnormality Wound Preparation Ulcer Cleansing: Rinsed/Irrigated with Saline Topical Anesthetic Applied: None Treatment Notes Wound #18 (Right, Dorsal Toe Great) Notes silver alginate, gauze, medipore tape Electronic Signature(s) Signed: 06/19/2018 2:44:29 PM By: [name] Entered By: [name] on 06/19/2018 08:57:02 Waage, [name] ([id number]) -------------------------------------------------------------------------------- Wound Assessment Details Patient Name: [name], [name] Date of Service: 06/19/2018 8:30 AM Medical Record Number: [id number] Patient Account Number: [id number] Date of Birth/Sex: 1950-07-28 (68 y.o. M) Treating RN: [name] Primary Care Kenna Kirn: [name] Other Clinician: Referring Aneira Cavitt: [name] Treating Sim Choquette/Extender: [name], [name] in Treatment: 90 Wound Status Wound Number: 2 Primary Diabetic Wound/Ulcer of the Lower Extremity Etiology: Wound Location: Right Toe Second Wound Open Wounding Event: Gradually Appeared Status: Date Acquired: 06/11/2016 Comorbid Anemia, Lymphedema, Congestive Heart [name] Of Treatment: 90 History: Failure, Hypertension, Peripheral Venous Clustered Wound: No Disease, Type II Diabetes, Neuropathy Pending Amputation On  Presentation Photos Photo Uploaded By: [name] on 06/19/2018 09:39:38 Wound Measurements Length: (cm) 1 Width: (cm) 2.2 Depth: (cm) 0.1 Area: (cm) 1.728 Volume: (cm) 0.173 % Reduction in Area: 43.7% % Reduction in Volume: 43.6% Epithelialization: Large (67-100%) Tunneling: No Undermining: No Wound Description Classification: Grade 2 Wound Margin: Indistinct, nonvisible Exudate Amount: Medium Exudate Type: Serous Exudate Color: amber Foul Odor After Cleansing: No Slough/Fibrino No Wound Bed Granulation Amount: Large (67-100%) Exposed Structure Granulation Quality: Pink Fascia Exposed: No Necrotic Amount: None Present (0%) Fat Layer (Subcutaneous Tissue) Exposed: No Tendon Exposed: No Muscle Exposed: No Joint Exposed: No Bone Exposed: No Limited to Skin Breakdown [name], [name] ([id number]) Periwound Skin Texture Texture Color No Abnormalities Noted: No No Abnormalities Noted: No Callus: No Atrophie Blanche: No Crepitus: No Cyanosis: No Excoriation: No Ecchymosis: No Induration: No Erythema: No Rash: No Hemosiderin Staining: No Scarring: No Mottled: No Pallor: No Moisture Rubor: No No Abnormalities Noted: No Dry / Scaly: No Temperature / Pain Maceration: No Temperature: No Abnormality Wound Preparation Ulcer Cleansing: Rinsed/Irrigated with Saline Topical Anesthetic Applied: None Treatment Notes Wound #2 (Right Toe Second) Notes silver alginate, gauze, medipore tape Electronic Signature(s) Signed: 06/19/2018 2:44:29 PM By: [name] Entered By: [name] on 06/19/2018 08:49:42 [place], Ata EMarland Kitchen ([id number]) -------------------------------------------------------------------------------- Vitals Details Patient Name: Tall, Elwyn E. Date of Service: 06/19/2018 8:30 AM Medical Record Number: [id number] Patient Account Number: [id number] Date of Birth/Sex: 1950/10/28 (68 y.o. M) Treating RN: Army Melia Primary Care Jeaneane Adamec: [name] Other Clinician: Referring Venisha Boehning: [name] Treating Rhett Najera/Extender: [name], [name] in Treatment: 90 Vital Signs Time Taken: 08:38 Temperature (F): 98.3 Height (in): 69 Pulse (bpm): 64 Weight (lbs): 168 Respiratory Rate (breaths/min): 16 Body Mass Index (BMI): 24.8 Blood Pressure (mmHg): 156/71 Reference Range: 80 - 120 mg / dl Electronic Signature(s) Signed: 06/19/2018 3:01:47 PM By: Juleen China,  RCP,RRT,CHT, Sallie RCP, RRT, CHT Entered By: [name] on 06/19/2018 08:40:45

## 2018-06-23 ENCOUNTER — Inpatient Hospital Stay: Payer: Medicare Other

## 2018-06-23 ENCOUNTER — Other Ambulatory Visit: Payer: Self-pay

## 2018-06-23 DIAGNOSIS — I130 Hypertensive heart and chronic kidney disease with heart failure and stage 1 through stage 4 chronic kidney disease, or unspecified chronic kidney disease: Secondary | ICD-10-CM | POA: Diagnosis not present

## 2018-06-23 DIAGNOSIS — D649 Anemia, unspecified: Secondary | ICD-10-CM

## 2018-06-23 LAB — PREPARE RBC (CROSSMATCH)

## 2018-06-23 MED ORDER — ACETAMINOPHEN 325 MG PO TABS
650.0000 mg | ORAL_TABLET | Freq: Once | ORAL | Status: AC
  Administered 2018-06-23: 650 mg via ORAL
  Filled 2018-06-23: qty 2

## 2018-06-23 MED ORDER — SODIUM CHLORIDE 0.9% IV SOLUTION
250.0000 mL | Freq: Once | INTRAVENOUS | Status: AC
  Administered 2018-06-23: 250 mL via INTRAVENOUS
  Filled 2018-06-23: qty 250

## 2018-06-24 ENCOUNTER — Other Ambulatory Visit: Payer: Self-pay | Admitting: *Deleted

## 2018-06-24 LAB — BPAM RBC
Blood Product Expiration Date: 202003312359
ISSUE DATE / TIME: 202003161407
Unit Type and Rh: 6200

## 2018-06-24 LAB — TYPE AND SCREEN
ABO/RH(D): A POS
Antibody Screen: NEGATIVE
Unit division: 0

## 2018-06-25 ENCOUNTER — Telehealth: Payer: Self-pay | Admitting: *Deleted

## 2018-06-25 NOTE — Telephone Encounter (Signed)
Called the brother in law after speaking to the patient yesterday about coming in for ferriheme.  The brother-in-law will be the person to bring the patient.  He can bring him tomorrow at 130 but next week on a Thursday the patient already has 3 other appointments.  The brother-in-law has to bring his wife over here next Wednesday so we have worked it out that [name] can come next Wednesday also and get his iron treatment

## 2018-06-26 ENCOUNTER — Other Ambulatory Visit: Payer: Self-pay

## 2018-06-26 ENCOUNTER — Inpatient Hospital Stay: Payer: Medicare Other

## 2018-06-26 VITALS — BP 154/77 | HR 61 | Temp 96.7°F | Resp 20

## 2018-06-26 DIAGNOSIS — I130 Hypertensive heart and chronic kidney disease with heart failure and stage 1 through stage 4 chronic kidney disease, or unspecified chronic kidney disease: Secondary | ICD-10-CM | POA: Diagnosis not present

## 2018-06-26 DIAGNOSIS — D509 Iron deficiency anemia, unspecified: Secondary | ICD-10-CM

## 2018-06-26 MED ORDER — SODIUM CHLORIDE 0.9 % IV SOLN
Freq: Once | INTRAVENOUS | Status: AC
  Administered 2018-06-26: 14:00:00 via INTRAVENOUS
  Filled 2018-06-26: qty 250

## 2018-06-26 MED ORDER — SODIUM CHLORIDE 0.9 % IV SOLN
510.0000 mg | Freq: Once | INTRAVENOUS | Status: AC
  Administered 2018-06-26: 510 mg via INTRAVENOUS
  Filled 2018-06-26: qty 17

## 2018-07-02 ENCOUNTER — Inpatient Hospital Stay: Payer: Medicare Other

## 2018-07-02 ENCOUNTER — Other Ambulatory Visit: Payer: Self-pay

## 2018-07-02 VITALS — BP 172/84 | HR 66 | Resp 20

## 2018-07-02 DIAGNOSIS — D509 Iron deficiency anemia, unspecified: Secondary | ICD-10-CM

## 2018-07-02 DIAGNOSIS — I130 Hypertensive heart and chronic kidney disease with heart failure and stage 1 through stage 4 chronic kidney disease, or unspecified chronic kidney disease: Secondary | ICD-10-CM | POA: Diagnosis not present

## 2018-07-02 MED ORDER — SODIUM CHLORIDE 0.9 % IV SOLN
510.0000 mg | Freq: Once | INTRAVENOUS | Status: AC
  Administered 2018-07-02: 510 mg via INTRAVENOUS
  Filled 2018-07-02: qty 17

## 2018-07-02 MED ORDER — SODIUM CHLORIDE 0.9 % IV SOLN
Freq: Once | INTRAVENOUS | Status: AC
  Administered 2018-07-02: 11:00:00 via INTRAVENOUS
  Filled 2018-07-02: qty 250

## 2018-07-03 ENCOUNTER — Encounter: Payer: Medicare Other | Admitting: Physician Assistant

## 2018-07-03 ENCOUNTER — Ambulatory Visit: Payer: Medicare Other

## 2018-07-03 DIAGNOSIS — E11621 Type 2 diabetes mellitus with foot ulcer: Secondary | ICD-10-CM | POA: Diagnosis not present

## 2018-07-04 NOTE — Progress Notes (Signed)
[name], [name] ([id number]) Visit Report for 07/03/2018 Arrival Information Details Patient Name: [name], [name] Date of Service: 07/03/2018 8:30 AM Medical Record Number: [id number] Patient Account Number: [id number] Date of Birth/Sex: 1950-11-30 (68 y.o. M) Treating RN: Montey Hora Primary Care [name]: [name] Other Clinician: Referring [name]: [name] Treating [name]/Extender: [name], [name]: 19 Visit Information History Since Last Visit Added or deleted any medications: No Patient Arrived: Ambulatory Any new allergies or adverse reactions: No Arrival Time: 08:31 Had a fall or experienced change in No Accompanied By: self activities of daily living that may affect Transfer Assistance: None risk of falls: Patient Identification Verified: Yes Signs or symptoms of abuse/neglect since last visito No Secondary Verification Process Completed: Yes Hospitalized since last visit: No Patient Requires Transmission-Based No Implantable device outside of the clinic excluding No Precautions: cellular tissue based products placed in the center Patient Has Alerts: Yes since last visit: Patient Alerts: DMII Has Dressing in Place as Prescribed: Yes Pain Present Now: No Electronic Signature(s) Signed: 07/03/2018 10:13:10 AM By: Montey Hora Entered By: Montey Hora on 07/03/2018 08:32:50 [name], [name] ([id number]) -------------------------------------------------------------------------------- Clinic Level of Care Assessment Details Patient Name: [name], [name] Date of Service: 07/03/2018 8:30 AM Medical Record Number: [id number] Patient Account Number: [id number] Date of Birth/Sex: Jul 14, 1950 (68 y.o. M) Treating RN: Army Melia Primary Care [name]: [name] Other Clinician: Referring [name]: [name] Treating [name]/Extender: [name], [name]: 47 Clinic Level of Care Assessment Items TOOL 4 Quantity Score []   - Use when only an EandM is performed on FOLLOW-UP visit 0 ASSESSMENTS - Nursing Assessment / Reassessment X - Reassessment of Co-morbidities (includes updates in patient status) 1 10 X- 1 5 Reassessment of Adherence to [name] Plan ASSESSMENTS - Wound and Skin Assessment / Reassessment []  - Simple Wound Assessment / Reassessment - one wound 0 X- 5 5 Complex Wound Assessment / Reassessment - multiple wounds []  - 0 Dermatologic / Skin Assessment (not related to wound area) ASSESSMENTS - Focused Assessment []  - Circumferential Edema Measurements - multi extremities 0 []  - 0 Nutritional Assessment / Counseling / Intervention []  - 0 Lower Extremity Assessment (monofilament, tuning fork, pulses) []  - 0 Peripheral Arterial Disease Assessment (using hand held doppler) ASSESSMENTS - Ostomy and/or Continence Assessment and Care []  - Incontinence Assessment and Management 0 []  - 0 Ostomy Care Assessment and Management (repouching, etc.) PROCESS - Coordination of Care X - Simple Patient / Family Education for ongoing care 1 15 []  - 0 Complex (extensive) Patient / Family Education for ongoing care []  - 0 Staff obtains Programmer, systems, Records, Test Results / Process Orders []  - 0 Staff telephones HHA, Nursing Homes / Clarify orders / etc []  - 0 Routine Transfer to another Facility (non-emergent condition) []  - 0 Routine Hospital Admission (non-emergent condition) []  - 0 New Admissions / Biomedical engineer / Ordering NPWT, Apligraf, etc. []  - 0 Emergency Hospital Admission (emergent condition) X- 1 10 Simple Discharge Coordination [name], [name] ([id number]) []  - 0 Complex (extensive) Discharge Coordination PROCESS - Special Needs []  - Pediatric / Minor Patient Management 0 []  - 0 Isolation Patient Management []  - 0 Hearing / Language / Visual special needs []  - 0 Assessment of Community assistance (transportation, D/C planning, etc.) []  - 0 Additional assistance / Altered  mentation []  - 0 Support Surface(s) Assessment (bed, cushion, seat, etc.) INTERVENTIONS - Wound Cleansing / Measurement []  - Simple Wound Cleansing - one wound 0 X- 5 5 Complex  Wound Cleansing - multiple wounds X- 1 5 Wound Imaging (photographs - any number of wounds) []  - 0 Wound Tracing (instead of photographs) []  - 0 Simple Wound Measurement - one wound X- 5 5 Complex Wound Measurement - multiple wounds INTERVENTIONS - Wound Dressings []  - Small Wound Dressing one or multiple wounds 0 X- 5 15 Medium Wound Dressing one or multiple wounds []  - 0 Large Wound Dressing one or multiple wounds []  - 0 Application of Medications - topical []  - 0 Application of Medications - injection INTERVENTIONS - Miscellaneous []  - External ear exam 0 []  - 0 Specimen Collection (cultures, biopsies, blood, body fluids, etc.) []  - 0 Specimen(s) / Culture(s) sent or taken to Lab for analysis []  - 0 Patient Transfer (multiple staff / Civil Service fast streamer / Similar devices) []  - 0 Simple Staple / Suture removal (25 or less) []  - 0 Complex Staple / Suture removal (26 or more) []  - 0 Hypo / Hyperglycemic Management (close monitor of Blood Glucose) []  - 0 Ankle / Brachial Index (ABI) - do not check if billed separately X- 1 5 Vital Signs [name], [name] ([id number]) Has the patient been seen at the hospital within the last three years: Yes Total Score: 200 Level Of Care: New/Established - Level 5 Electronic Signature(s) Signed: 07/03/2018 9:15:54 AM By: Army Melia Entered By: Army Melia on 07/03/2018 09:04:47 [name], [name] ([id number]) -------------------------------------------------------------------------------- Encounter Discharge Information Details Patient Name: [name], [name] Date of Service: 07/03/2018 8:30 AM Medical Record Number: [id number] Patient Account Number: [id number] Date of Birth/Sex: July 09, 1950 (68 y.o. M) Treating RN: Harold Barban Primary Care Sherelle Castelli: [name]  Other Clinician: Referring Tomislav Micale: [name] Treating Wojciech Willetts/Extender: [name], [name]: 71 Encounter Discharge Information Items Discharge Condition: Stable Ambulatory Status: Ambulatory Discharge Destination: Home Transportation: Private Auto Accompanied By: self Schedule Follow-up Appointment: Yes Clinical Summary of Care: Electronic Signature(s) Signed: 07/03/2018 10:08:57 AM By: Harold Barban Entered By: Harold Barban on 07/03/2018 09:18:52 Llera, [name] ([id number]) -------------------------------------------------------------------------------- Lower Extremity Assessment Details Patient Name: [name], [name] Date of Service: 07/03/2018 8:30 AM Medical Record Number: [id number] Patient Account Number: [id number] Date of Birth/Sex: 1950-04-27 (68 y.o. M) Treating RN: Montey Hora Primary Care Barnes Florek: [name] Other Clinician: Referring Caydn Justen: [name] Treating Nijah Tejera/Extender: [name], [name]: 69 Edema Assessment Assessed: [Left: No] [Right: No] Edema: [Left: Yes] [Right: Yes] Vascular Assessment Pulses: Dorsalis Pedis Palpable: [Left:Yes] [Right:Yes] Extremity colors, hair growth, and conditions: Extremity Color: [Left:Hyperpigmented] [Right:Hyperpigmented] Hair Growth on Extremity: [Left:No] [Right:No] Temperature of Extremity: [Left:Warm < 3 seconds] [Right:Warm < 3 seconds] Toe Nail Assessment Left: Right: Thick: Yes Yes Discolored: Yes Yes Deformed: Yes Yes Improper Length and Hygiene: No No Electronic Signature(s) Signed: 07/03/2018 10:13:10 AM By: Montey Hora Entered By: Montey Hora on 07/03/2018 08:45:11 [name], [name] ([id number]) -------------------------------------------------------------------------------- Multi Wound Chart Details Patient Name: [name], [name] Date of Service: 07/03/2018 8:30 AM Medical Record Number: [id number] Patient Account Number: [id number] Date of Birth/Sex:  1950/06/13 (68 y.o. M) Treating RN: Army Melia Primary Care Kailer Heindel: [name] Other Clinician: Referring Doak Mah: [name] Treating Aubert Choyce/Extender: [name], [name]: 24 Vital Signs Height(in): 67 Pulse(bpm): 69 Weight(lbs): 168 Blood Pressure(mmHg): 182/96 Body Mass Index(BMI): 25 Temperature(F): 98.2 Respiratory Rate 18 (breaths/min): Photos: Wound Location: Left Toe Second Right Toe Great - Plantar Right Metatarsal head second - Plantar, Distal Wounding Event: Gradually Appeared Not Known Trauma Primary Etiology: Diabetic Wound/Ulcer of the Diabetic Wound/Ulcer of the Diabetic Wound/Ulcer of  the Lower Extremity Lower Extremity Lower Extremity Comorbid History: Anemia, Lymphedema, Anemia, Lymphedema, Anemia, Lymphedema, Congestive Heart Failure, Congestive Heart Failure, Congestive Heart Failure, Hypertension, Peripheral Hypertension, Peripheral Hypertension, Peripheral Venous Disease, Type II Venous Disease, Type II Venous Disease, Type II Diabetes, Neuropathy Diabetes, Neuropathy Diabetes, Neuropathy Date Acquired: 09/26/2017 10/29/2017 02/03/2018 Weeks of [name]: 40 35 21 Wound Status: Open Open Open Pending Amputation on No No No Presentation: Measurements L x W x D 0.9x2.1x0.1 0.8x1x0.1 0.6x0.1x0.1 (cm) Area (cm) : 1.484 0.628 0.047 Volume (cm) : 0.148 0.063 0.005 % Reduction in Area: 37.00% 60.80% 86.70% % Reduction in Volume: 37.30% 60.60% 85.70% Classification: Grade 1 Grade 1 Grade 1 Exudate Amount: Medium Medium Medium Exudate Type: Sanguinous Serous Serous Exudate Color: red amber amber Wound Margin: Indistinct, nonvisible Flat and Intact Flat and Intact Granulation Amount: Large (67-100%) Large (67-100%) Large (67-100%) Granulation Quality: Pink Pink Pink Necrotic Amount: None Present (0%) Small (1-33%) Small (1-33%) Clanton, Dai E. ([id number]) Exposed Structures: Fat Layer (Subcutaneous Fat Layer (Subcutaneous Fat  Layer (Subcutaneous Tissue) Exposed: Yes Tissue) Exposed: Yes Tissue) Exposed: Yes Fascia: No Fascia: No Fascia: No Tendon: No Tendon: No Tendon: No Muscle: No Muscle: No Muscle: No Joint: No Joint: No Joint: No Bone: No Bone: No Bone: No Epithelialization: Medium (34-66%) Small (1-33%) Medium (34-66%) Periwound Skin Texture: Scarring: Yes Scarring: Yes Scarring: Yes Excoriation: No Excoriation: No Excoriation: No Induration: No Induration: No Induration: No Callus: No Callus: No Callus: No Crepitus: No Crepitus: No Crepitus: No Rash: No Rash: No Rash: No Periwound Skin Moisture: Maceration: Yes Maceration: Yes Maceration: Yes Dry/Scaly: No Dry/Scaly: No Dry/Scaly: No Periwound Skin Color: Hemosiderin Staining: Yes Atrophie Blanche: No Atrophie Blanche: No Atrophie Blanche: No Cyanosis: No Cyanosis: No Cyanosis: No Ecchymosis: No Ecchymosis: No Ecchymosis: No Erythema: No Erythema: No Erythema: No Hemosiderin Staining: No Hemosiderin Staining: No Mottled: No Mottled: No Mottled: No Pallor: No Pallor: No Pallor: No Rubor: No Rubor: No Rubor: No Temperature: N/A No Abnormality No Abnormality Tenderness on Palpation: Yes No No Wound Preparation: Ulcer Cleansing: Ulcer Cleansing: Ulcer Cleansing: Rinsed/Irrigated with Saline Rinsed/Irrigated with Saline Rinsed/Irrigated with Saline Topical Anesthetic Applied: Topical Anesthetic Applied: Topical Anesthetic Applied: Other: lidocaine 4% Other: lidocaine 4% Other: lidocaine 4% Wound Number: [id number] N/A Photos: N/A Wound Location: Right Toe Great - Dorsal Right Toe Second N/A Wounding Event: Gradually Appeared Gradually Appeared N/A Primary Etiology: Diabetic Wound/Ulcer of the Diabetic Wound/Ulcer of the N/A Lower Extremity Lower Extremity Comorbid History: Anemia, Lymphedema, Anemia, Lymphedema, N/A Congestive Heart Failure, Congestive Heart Failure, Hypertension, Peripheral  Hypertension, Peripheral Venous Disease, Type II Venous Disease, Type II Diabetes, Neuropathy Diabetes, Neuropathy Date Acquired: 05/22/2018 06/11/2016 N/A Weeks of [name]: 6 92 N/A Wound Status: Open Open N/A Pending Amputation on No Yes N/A Presentation: Measurements L x W x D 0.1x0.1x0.1 0.1x0.1x0.1 N/A (cm) Area (cm) : 0.008 0.008 N/A [name], Xadrian E. ([id number]) Volume (cm) : 0.001 0.001 N/A % Reduction in Area: 99.00% 99.70% N/A % Reduction in Volume: 98.80% 99.70% N/A Classification: Grade 1 Grade 2 N/A Exudate Amount: Medium Medium N/A Exudate Type: Serous Serous N/A Exudate Color: amber amber N/A Wound Margin: Flat and Intact Indistinct, nonvisible N/A Granulation Amount: Large (67-100%) Large (67-100%) N/A Granulation Quality: Pink Pink N/A Necrotic Amount: None Present (0%) None Present (0%) N/A Exposed Structures: Fascia: No Fascia: No N/A Fat Layer (Subcutaneous Fat Layer (Subcutaneous Tissue) Exposed: No Tissue) Exposed: No Tendon: No Tendon: No Muscle: No Muscle: No Joint: No Joint: No Bone: No Bone: No Limited  to Skin Breakdown Epithelialization: Medium (34-66%) Large (67-100%) N/A Periwound Skin Texture: Scarring: Yes Scarring: Yes N/A Excoriation: No Excoriation: No Induration: No Induration: No Callus: No Callus: No Crepitus: No Crepitus: No Rash: No Rash: No Periwound Skin Moisture: Maceration: Yes Maceration: Yes N/A Dry/Scaly: No Dry/Scaly: No Periwound Skin Color: Atrophie Blanche: No Atrophie Blanche: No N/A Cyanosis: No Cyanosis: No Ecchymosis: No Ecchymosis: No Erythema: No Erythema: No Hemosiderin Staining: No Hemosiderin Staining: No Mottled: No Mottled: No Pallor: No Pallor: No Rubor: No Rubor: No Temperature: No Abnormality No Abnormality N/A Tenderness on Palpation: No No N/A Wound Preparation: Ulcer Cleansing: Ulcer Cleansing: N/A Rinsed/Irrigated with Saline Rinsed/Irrigated with Saline Topical  Anesthetic Applied: Topical Anesthetic Applied: None None [name] Notes Electronic Signature(s) Signed: 07/03/2018 9:15:54 AM By: Army Melia Entered By: Army Melia on 07/03/2018 08:59:23 [name], [name] ([id number]) -------------------------------------------------------------------------------- [place] Details Patient Name: Mortell, Agron E. Date of Service: 07/03/2018 8:30 AM Medical Record Number: [id number] Patient Account Number: [id number] Date of Birth/Sex: 1951-01-27 (68 y.o. M) Treating RN: Army Melia Primary Care Calysta Craigo: [name] Other Clinician: Referring Daylen Hack: [name] Treating Hailey Stormer/Extender: [name], [name]: 13 Active Inactive Abuse / Safety / Falls / Self Care Management Nursing Diagnoses: Potential for falls Goals: Patient will remain injury free related to falls Date Initiated: 09/21/2016 Target Resolution Date: 09/14/2017 Goal Status: Active Interventions: Assess fall risk on admission and as needed Notes: Nutrition Nursing Diagnoses: Potential for alteratiion in Nutrition/Potential for imbalanced nutrition Goals: Patient/caregiver agrees to and verbalizes understanding of need to use nutritional supplements and/or vitamins as prescribed Date Initiated: 09/21/2016 Target Resolution Date: 09/14/2017 Goal Status: Active Interventions: Assess patient nutrition upon admission and as needed per policy Notes: Orientation to the Wound Care Program Nursing Diagnoses: Knowledge deficit related to the wound healing center program Goals: Patient/caregiver will verbalize understanding of the [place] Program Date Initiated: 09/21/2016 Target Resolution Date: 06/15/2017 Goal Status: Active Interventions: Provide education on orientation to the wound center [name], [name] ([id number]) Notes: Wound/Skin Impairment Nursing Diagnoses: Knowledge deficit related to smoking impact on wound  healing Goals: Ulcer/skin breakdown will have a volume reduction of 30% by week 4 Date Initiated: 09/21/2016 Target Resolution Date: 08/17/2017 Goal Status: Active Ulcer/skin breakdown will have a volume reduction of 50% by week 8 Date Initiated: 09/21/2016 Target Resolution Date: 08/17/2017 Goal Status: Active Ulcer/skin breakdown will have a volume reduction of 80% by week 12 Date Initiated: 09/21/2016 Target Resolution Date: 09/14/2017 Goal Status: Active Ulcer/skin breakdown will heal within 14 weeks Date Initiated: 09/21/2016 Target Resolution Date: 08/17/2017 Goal Status: Active Interventions: Assess patient/caregiver ability to obtain necessary supplies Assess patient/caregiver ability to perform ulcer/skin care regimen upon admission and as needed Assess ulceration(s) every visit Notes: Electronic Signature(s) Signed: 07/03/2018 9:15:54 AM By: Army Melia Entered By: Army Melia on 07/03/2018 08:59:12 [name], [name] ([id number]) -------------------------------------------------------------------------------- Pain Assessment Details Patient Name: [name], [name] Date of Service: 07/03/2018 8:30 AM Medical Record Number: [id number] Patient Account Number: [id number] Date of Birth/Sex: [birth year] (68 y.o. M) Treating RN: Montey Hora Primary Care Ronnie Doo: [name] Other Clinician: Referring Moni Rothrock: [name] Treating Janal Haak/Extender: [name], [name]: 41 Active Problems Location of Pain Severity and Description of Pain Patient Has Paino No Site Locations Pain Management and Medication Current Pain Management: Electronic Signature(s) Signed: 07/03/2018 10:13:10 AM By: Montey Hora Entered By: Montey Hora on 07/03/2018 08:32:55 [name], [name] ([id number]) -------------------------------------------------------------------------------- Patient/Caregiver Education Details Patient Name: Dierolf, Montrez E. Date of Service:  07/03/2018 8:30  AM Medical Record Number: [id number] Patient Account Number: [id number] Date of Birth/Gender: 02-19-1951 (68 y.o. M) Treating RN: Army Melia Primary Care Physician: [name] Other Clinician: Referring Physician: Lamonte [name] Treating Physician/Extender: Sharalyn Ink in [name]: 5 Education Assessment Education Provided To: Patient Education Topics Provided Wound/Skin Impairment: Handouts: Caring for Your Ulcer Methods: Demonstration, Explain/Verbal Responses: State content correctly Electronic Signature(s) Signed: 07/03/2018 9:15:54 AM By: Army Melia Entered By: Army Melia on 07/03/2018 09:05:03 Ra, [name] ([id number]) -------------------------------------------------------------------------------- Wound Assessment Details Patient Name: Bernet, Jacksyn E. Date of Service: 07/03/2018 8:30 AM Medical Record Number: [id number] Patient Account Number: [id number] Date of Birth/Sex: 03-04-1951 (68 y.o. M) Treating RN: Montey Hora Primary Care Ensley Blas: [name] Other Clinician: Referring Reyli Schroth: [name] Treating Alyannah Sanks/Extender: [name], [name]: 71 Wound Status Wound Number: 14 Primary Diabetic Wound/Ulcer of the Lower Extremity Etiology: Wound Location: Left Toe Second Wound Open Wounding Event: Gradually Appeared Status: Date Acquired: 09/26/2017 Comorbid Anemia, Lymphedema, Congestive Heart Weeks Of [name]: 40 History: Failure, Hypertension, Peripheral Venous Clustered Wound: No Disease, Type II Diabetes, Neuropathy Photos Wound Measurements Length: (cm) 0.9 % Reduction i Width: (cm) 2.1 % Reduction i Depth: (cm) 0.1 Epithelializa Area: (cm) 1.484 Tunneling: Volume: (cm) 0.148 Undermining: n Area: 37% n Volume: 37.3% tion: Medium (34-66%) No No Wound Description Classification: Grade 1 Foul Odor Af Wound Margin: Indistinct, nonvisible Slough/Fibri Exudate Amount: Medium Exudate Type:  Sanguinous Exudate Color: red ter Cleansing: No no Yes Wound Bed Granulation Amount: Large (67-100%) Exposed Structure Granulation Quality: Pink Fascia Exposed: No Necrotic Amount: None Present (0%) Fat Layer (Subcutaneous Tissue) Exposed: Yes Tendon Exposed: No Muscle Exposed: No Joint Exposed: No Bone Exposed: No Periwound Skin Texture Texture Color Shively, Abdulkadir E. ([id number]) No Abnormalities Noted: No No Abnormalities Noted: No Callus: No Atrophie Blanche: No Crepitus: No Cyanosis: No Excoriation: No Ecchymosis: No Induration: No Erythema: No Rash: No Hemosiderin Staining: Yes Scarring: Yes Mottled: No Pallor: No Moisture Rubor: No No Abnormalities Noted: No Dry / Scaly: No Temperature / Pain Maceration: Yes Tenderness on Palpation: Yes Wound Preparation Ulcer Cleansing: Rinsed/Irrigated with Saline Topical Anesthetic Applied: Other: lidocaine 4%, [name] Notes Wound #14 (Left Toe Second) Notes Silver cell, gauze, tape to secure Electronic Signature(s) Signed: 07/03/2018 10:13:10 AM By: Montey Hora Entered By: Montey Hora on 07/03/2018 08:39:46 Adcox, [name] ([id number]) -------------------------------------------------------------------------------- Wound Assessment Details Patient Name: Rome, Tayshon E. Date of Service: 07/03/2018 8:30 AM Medical Record Number: [id number] Patient Account Number: [id number] Date of Birth/Sex: Jul 02, 1950 (68 y.o. M) Treating RN: Montey Hora Primary Care Lanny Lipkin: [name] Other Clinician: Referring Eisha Chatterjee: [name] Treating Earnestine Tuohey/Extender: [name], [name]: 48 Wound Status Wound Number: 16 Primary Diabetic Wound/Ulcer of the Lower Extremity Etiology: Wound Location: Right Toe Great - Plantar Wound Open Wounding Event: Not Known Status: Date Acquired: 10/29/2017 Comorbid Anemia, Lymphedema, Congestive Heart Weeks Of [name]: 35 History: Failure, Hypertension, Peripheral  Venous Clustered Wound: No Disease, Type II Diabetes, Neuropathy Photos Wound Measurements Length: (cm) 0.8 % Reduction in Width: (cm) 1 % Reduction in Depth: (cm) 0.1 Epithelializat Area: (cm) 0.628 Tunneling: Volume: (cm) 0.063 Undermining: Area: 60.8% Volume: 60.6% ion: Small (1-33%) No No Wound Description Classification: Grade 1 Foul Odor Aft Wound Margin: Flat and Intact Slough/Fibrin Exudate Amount: Medium Exudate Type: Serous Exudate Color: amber er Cleansing: No o Yes Wound Bed Granulation Amount: Large (67-100%) Exposed Structure Granulation Quality: Pink Fascia Exposed: No Necrotic Amount: Small (1-33%) Fat Layer (Subcutaneous Tissue) Exposed: Yes  Necrotic Quality: Adherent Slough Tendon Exposed: No Muscle Exposed: No Joint Exposed: No Bone Exposed: No Periwound Skin Texture Texture Color Durney, Stacy E. ([id number]) No Abnormalities Noted: No No Abnormalities Noted: No Callus: No Atrophie Blanche: No Crepitus: No Cyanosis: No Excoriation: No Ecchymosis: No Induration: No Erythema: No Rash: No Hemosiderin Staining: No Scarring: Yes Mottled: No Pallor: No Moisture Rubor: No No Abnormalities Noted: No Dry / Scaly: No Temperature / Pain Maceration: Yes Temperature: No Abnormality Wound Preparation Ulcer Cleansing: Rinsed/Irrigated with Saline Topical Anesthetic Applied: Other: lidocaine 4%, [name] Notes Wound #16 (Right, Plantar Toe Great) Notes Silver cell, gauze, tape to secure Electronic Signature(s) Signed: 07/03/2018 10:13:10 AM By: Montey Hora Entered By: Montey Hora on 07/03/2018 08:40:39 Nakanishi, [name] ([id number]) -------------------------------------------------------------------------------- Wound Assessment Details Patient Name: Ysaguirre, Orazio E. Date of Service: 07/03/2018 8:30 AM Medical Record Number: [id number] Patient Account Number: [id number] Date of Birth/Sex: Aug 21, 1950 (68 y.o. M) Treating RN: Montey Hora Primary Care Keiko Myricks: [name] Other Clinician: Referring Kaiyu Mirabal: [name] Treating Adalberto Metzgar/Extender: [name], [name]: 38 Wound Status Wound Number: 17 Primary Diabetic Wound/Ulcer of the Lower Extremity Etiology: Wound Location: Right Metatarsal head second - Plantar, Distal Wound Open Status: Wounding Event: Trauma Comorbid Anemia, Lymphedema, Congestive Heart Date Acquired: 02/03/2018 History: Failure, Hypertension, Peripheral Venous Weeks Of [name]: 21 Disease, Type II Diabetes, Neuropathy Clustered Wound: No Photos Wound Measurements Length: (cm) 0.6 Width: (cm) 0.1 Depth: (cm) 0.1 Area: (cm) 0.047 Volume: (cm) 0.005 % Reduction in Area: 86.7% % Reduction in Volume: 85.7% Epithelialization: Medium (34-66%) Tunneling: No Undermining: No Wound Description Classification: Grade 1 Wound Margin: Flat and Intact Exudate Amount: Medium Exudate Type: Serous Exudate Color: amber Foul Odor After Cleansing: No Slough/Fibrino Yes Wound Bed Granulation Amount: Large (67-100%) Exposed Structure Granulation Quality: Pink Fascia Exposed: No Necrotic Amount: Small (1-33%) Fat Layer (Subcutaneous Tissue) Exposed: Yes Necrotic Quality: Adherent Slough Tendon Exposed: No Muscle Exposed: No Joint Exposed: No Bone Exposed: No Periwound Skin Texture Spelman, Prospero E. ([id number]) Texture Color No Abnormalities Noted: No No Abnormalities Noted: No Callus: No Atrophie Blanche: No Crepitus: No Cyanosis: No Excoriation: No Ecchymosis: No Induration: No Erythema: No Rash: No Hemosiderin Staining: No Scarring: Yes Mottled: No Pallor: No Moisture Rubor: No No Abnormalities Noted: No Dry / Scaly: No Temperature / Pain Maceration: Yes Temperature: No Abnormality Wound Preparation Ulcer Cleansing: Rinsed/Irrigated with Saline Topical Anesthetic Applied: Other: lidocaine 4%, [name] Notes Wound #17 (Right, Distal, Plantar  Metatarsal head second) Notes Silver cell, gauze, tape to secure Electronic Signature(s) Signed: 07/03/2018 10:13:10 AM By: Montey Hora Entered By: Montey Hora on 07/03/2018 08:41:34 Manfred, [name] ([id number]) -------------------------------------------------------------------------------- Wound Assessment Details Patient Name: Bethea, Yordin E. Date of Service: 07/03/2018 8:30 AM Medical Record Number: [id number] Patient Account Number: [id number] Date of Birth/Sex: 05-22-1950 (68 y.o. M) Treating RN: Montey Hora Primary Care Pius Byrom: [name] Other Clinician: Referring Marlicia Sroka: [name] Treating Alycen Mack/Extender: [name], [name]: 51 Wound Status Wound Number: 18 Primary Diabetic Wound/Ulcer of the Lower Extremity Etiology: Wound Location: Right Toe Great - Dorsal Wound Open Wounding Event: Gradually Appeared Status: Date Acquired: 05/22/2018 Comorbid Anemia, Lymphedema, Congestive Heart Weeks Of [name]: 6 History: Failure, Hypertension, Peripheral Venous Clustered Wound: No Disease, Type II Diabetes, Neuropathy Photos Wound Measurements Length: (cm) 0.1 % Reduction in Width: (cm) 0.1 % Reduction in Depth: (cm) 0.1 Epithelializat Area: (cm) 0.008 Tunneling: Volume: (cm) 0.001 Undermining: Area: 99% Volume: 98.8% ion: Medium (34-66%) No No Wound Description Classification: Grade 1 Foul  Odor Aft Wound Margin: Flat and Intact Slough/Fibrin Exudate Amount: Medium Exudate Type: Serous Exudate Color: amber er Cleansing: No o No Wound Bed Granulation Amount: Large (67-100%) Exposed Structure Granulation Quality: Pink Fascia Exposed: No Necrotic Amount: None Present (0%) Fat Layer (Subcutaneous Tissue) Exposed: No Tendon Exposed: No Muscle Exposed: No Joint Exposed: No Bone Exposed: No Periwound Skin Texture Texture Color Crandell, Jasani E. ([id number]) No Abnormalities Noted: No No Abnormalities Noted: No Callus:  No Atrophie Blanche: No Crepitus: No Cyanosis: No Excoriation: No Ecchymosis: No Induration: No Erythema: No Rash: No Hemosiderin Staining: No Scarring: Yes Mottled: No Pallor: No Moisture Rubor: No No Abnormalities Noted: No Dry / Scaly: No Temperature / Pain Maceration: Yes Temperature: No Abnormality Wound Preparation Ulcer Cleansing: Rinsed/Irrigated with Saline Topical Anesthetic Applied: None [name] Notes Wound #18 (Right, Dorsal Toe Great) Notes Silver cell, gauze, tape to secure Electronic Signature(s) Signed: 07/03/2018 10:13:10 AM By: Montey Hora Entered By: Montey Hora on 07/03/2018 08:42:22 Guisinger, Olson E. ([id number]) -------------------------------------------------------------------------------- Wound Assessment Details Patient Name: Capp, Jeromiah E. Date of Service: 07/03/2018 8:30 AM Medical Record Number: [id number] Patient Account Number: [id number] Date of Birth/Sex: [birth year] (68 y.o. M) Treating RN: Montey Hora Primary Care Chamille Werntz: [name] Other Clinician: Referring Endora Teresi: [name] Treating Ashvin Adelson/Extender: [name], [name]: 11 Wound Status Wound Number: 2 Primary Diabetic Wound/Ulcer of the Lower Extremity Etiology: Wound Location: Right Toe Second Wound Open Wounding Event: Gradually Appeared Status: Date Acquired: 06/11/2016 Comorbid Anemia, Lymphedema, Congestive Heart Weeks Of [name]: 92 History: Failure, Hypertension, Peripheral Venous Clustered Wound: No Disease, Type II Diabetes, Neuropathy Pending Amputation On Presentation Photos Wound Measurements Length: (cm) 0.1 % Reduction i Width: (cm) 0.1 % Reduction i Depth: (cm) 0.1 Epithelializa Area: (cm) 0.008 Tunneling: Volume: (cm) 0.001 Undermining: n Area: 99.7% n Volume: 99.7% tion: Large (67-100%) No No Wound Description Classification: Grade 2 Foul Odor Af Wound Margin: Indistinct, nonvisible Slough/Fibri Exudate  Amount: Medium Exudate Type: Serous Exudate Color: amber ter Cleansing: No no No Wound Bed Granulation Amount: Large (67-100%) Exposed Structure Granulation Quality: Pink Fascia Exposed: No Necrotic Amount: None Present (0%) Fat Layer (Subcutaneous Tissue) Exposed: No Tendon Exposed: No Muscle Exposed: No Joint Exposed: No Bone Exposed: No Limited to Skin Breakdown Ballengee, Susumu E. ([id number]) Periwound Skin Texture Texture Color No Abnormalities Noted: No No Abnormalities Noted: No Callus: No Atrophie Blanche: No Crepitus: No Cyanosis: No Excoriation: No Ecchymosis: No Induration: No Erythema: No Rash: No Hemosiderin Staining: No Scarring: Yes Mottled: No Pallor: No Moisture Rubor: No No Abnormalities Noted: No Dry / Scaly: No Temperature / Pain Maceration: Yes Temperature: No Abnormality Wound Preparation Ulcer Cleansing: Rinsed/Irrigated with Saline Topical Anesthetic Applied: None [name] Notes Wound #2 (Right Toe Second) Notes Silver cell, gauze, tape to secure Electronic Signature(s) Signed: 07/03/2018 10:13:10 AM By: Montey Hora Entered By: Montey Hora on 07/03/2018 08:44:39 Havlicek, [name] ([id number]) -------------------------------------------------------------------------------- Vitals Details Patient Name: Maruyama, Bevan E. Date of Service: 07/03/2018 8:30 AM Medical Record Number: [id number] Patient Account Number: [id number] Date of Birth/Sex: 1950/06/21 (68 y.o. M) Treating RN: Montey Hora Primary Care Imani Sherrin: [name] Other Clinician: Referring Shirly Bartosiewicz: [name] Treating Zo Loudon/Extender: [name], [name]: 58 Vital Signs Time Taken: 08:33 Temperature (F): 98.2 Height (in): 69 Pulse (bpm): 82 Weight (lbs): 168 Respiratory Rate (breaths/min): 18 Body Mass Index (BMI): 24.8 Blood Pressure (mmHg): 182/96 Reference Range: 80 - 120 mg / dl Electronic Signature(s) Signed: 07/03/2018 10:13:10 AM By:  Montey Hora Entered By: Montey Hora on  07/03/2018 08:35:02 

## 2018-07-04 NOTE — Progress Notes (Signed)
BENTLEE, BENNINGFIELD ([id number]) Visit Report for 07/03/2018 Chief Complaint Document Details Patient Name: [name], [name] Date of Service: 07/03/2018 8:30 AM Medical Record Number: [id number] Patient Account Number: [id number] Date of Birth/Sex: August 03, 1950 (68 y.o. M) Treating RN: Army Melia Primary Care Provider: Lamonte Sakai Other Clinician: Referring Provider: Lamonte Sakai Treating Provider/Extender: Melburn Hake, HOYT Weeks in Treatment: 7 Information Obtained from: Patient Chief Complaint Patient presents for treatment of an open diabetic ulcer to both feet Electronic Signature(s) Signed: 07/04/2018 8:34:45 AM By: Worthy Keeler PA-C Entered By: Worthy Keeler on 07/03/2018 08:45:31 Engelson, [name] ([id number]) -------------------------------------------------------------------------------- HPI Details Patient Name: [name], [name] Date of Service: 07/03/2018 8:30 AM Medical Record Number: [id number] Patient Account Number: [id number] Date of Birth/Sex: 1950/10/14 (68 y.o. M) Treating RN: Army Melia Primary Care Provider: Lamonte Sakai Other Clinician: Referring Provider: Lamonte Sakai Treating Provider/Extender: Melburn Hake, HOYT Weeks in Treatment: 65 History of Present Illness HPI Description: 68 year old patient here to see as for bilateral feet ulceration to on his left first and second toe and 2 on his right first and second toe, which she's had for about 4 months. 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 Donella Stade. The patient is also undergoing workup by medical oncology for a intra-abdominal lymphadenopathy. 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 He is also status post appendectomy, inguinal lymph node biopsy, rectal biopsy and  rectal examination under anesthesia. he currently smokes cigarettes about half packet a day. In March of this year he was seen by Dr. Hortencia Pilar, for evaluation of bilateral varicose veins and besides wearing compression stockings he had recommended laser ablation of the right and left great saphenous veins to eleviate the symptoms and complications of severe superficial venous reflux disease. He also recommended lymphedema pumps for better control of his lymphedema. The patient recently has had on 08/23/2016, right greater saphenous vein ablation with the laser energy Earlier lower extremity venous reflux examination done on 05/08/2016 showed no DVT or SVT both lower legs but incompetence of bilateral great saphenous veins was present. A lower arterial study was also done and there was no significant right lower and left lower extremity problems based on a normal toe brachial index bilaterally and the ABI was 1.21 the left and 1.23 on the right. His post ablation venous duplex examination showed successful ablation of the right GS vein with thrombus formation 2 below the right saphenofemoral junction. The deep system was patent without evidence of thrombosis and this was done on 08/30/2016. 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 Addendum: regarding his x-rays done today and x-ray of the left foot -- IMPRESSION: No objective evidence of osteomyelitis. There are soft tissue changes which may reflect cellulitis. X-ray of the right foot -- IMPRESSION:Findings compatible with cellulitis of the toes. No objective evidence of osteomyelitis is observed. 10/01/16 on evaluation today patient's wounds appeared to be doing some better. I did review the x-rays as well which showed no evidence of osteomyelitis although there was evidence on x-ray of cellulitis. He fortunately is not having  any discomfort although he continues to have some swelling. He does not remember being on any antibiotics  recently. 10/15/16 on evaluation today patient's wounds overall appear to be doing better although he does have a new location noted on the left foot. Fortunately he is not having significant pain. It almost has the appearance that something is rubbing on the end of his toes but he wears the open toe shoes and according to what he is telling me never wears anything that would rub on his foot. There is no evidence of infection and specifically no evidence of a fungal infection 10/22/16 On evaluation today patient's wounds appeared to be doing better compared to last week in regard to his bilateral lower extremities. Fortunately I happy with how things are progressing although he still has ulcers I feel like that he is improving and appropriate manner. 11/12/16 on evaluation today patient appears to be doing well in regard to his bilateral feet and the respective wounds. We have been using surrounding her dressings along with an antifungal cream which seems to be doing very well. He has no bilateral dysfunction noticed that the rituals are weight loss at this point. He also has no nausea or vomiting a note purulent discharge. He did see Vein and vascular today and he tells me that they told him he could have surgery for his venous stasis but they did not feel like it was worth it in his words. Fortunately patient's wounds do appear to be getting sneakily better. [name] ([id number]) 11/26/2016 -- he says he is going to have some surgery during this week at [facility] for possibly a colon resection. 12/31/2016 -- the patient has been noncompliant with his smoking and I'm not sure whether he is also started drinking again. He continues to be very nonchalant about his care 01/14/2017 -- the patient's HandP has been reviewed well and I understand he is being compliant with trying to give up  smoking and his local dressing changes. He does not have any surgical options of 4 to him by his vascular surgeons.he was last seen in early August by Dr. Hortencia Pilar who recommended compression stockings,and possibly lymph pumps in 2-3 months after doing a review ultrasound. 01/28/2017 - the patient did not have any fresh complaints but on examination I noted a large lacerated wound on the plantar aspect of his right fourth toe which had a lot of necrotic debris and it probes down to bone. 02/07/2017 -- x-ray of the right foot -- IMPRESSION: Soft tissue swelling about the first through fourth toes consistent with cellulitis. New destructive change in the tuft of the distal phalanx of the great toe is consistent with osteomyelitis. 02/14/2017 -- the patient's MRI is pending this coming Monday and he still continues to smoke. We have again gone over off loading of his wounds in great detail and he says he's been compliant. 02/21/2017 -- MR of the right foot -- IMPRESSION: 1. Soft tissue ulcer at the tip of the first, second and third toe knows. Cortical irregularity and bone marrow edema in the first distal phalanx most concerning for osteomyelitis. Mild marrow edema in the second and third distal phalanx without definite cortical destruction which may reflect early osteomyelitis versus reactive marrow edema. 2. Soft tissue edema surrounding the first phalanx most consistent with cellulitis. the patient was also recently evaluated by his medical oncologist Dr. 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 She is treating him with weekly Procrit. She is also keeping intra-abdominal lymphadenopathy and right lower  lobe lung nodule under observation. 04/04/2017 -- he was seen by Dr. Adrian Prows on 03/25/2017 -- after review he empirically put him on ciprofloxacin and doxycycline as they have good bone penetration and good  bioavailability and it will cover the usual pathogens and diabetic foot osteomyelitis. He will check inflammatory markers and plan a 35-66 week old records. C-reactive protein was 0.3 and the ESR was 72 04/18/17 on evaluation today patient appears to be doing about the same in regard to his lower extremity wounds bilaterally. He has continued to use the antifungal cream which does seem to be beneficial. Nonetheless the ulcers do seem to in some areas be epithelial eyes over and in other areas are still open. He is having no significant discomfort. 04/25/17-he is here in follow-up evaluation for multiple ulcerations to multiple toes bilaterally. He states he did see Dr. Ola Spurr again last week and continues antibiotic therapy. He is voicing no complaints or concerns, will continue with current treatment plan will possibility of adding compression therapy next week after an additional week of treatment/lotions to BLE prescribed by Dr Ola Spurr 05/02/17 he is here in follow up for for multiple ulcers to multiple toes bilaterally. we will stop using antifungal cream and will continue with silvercel and follow up next week 05/09/17-he is here in follow-up for multiple ulcerations to multiple toes bilaterally. There is improvement in appearance. He has not completely stopped using antifungal cream, but admits he has not using it between the toes. He has an appointment with Dr. Ola Spurr on 2/11, continues on doxycycline and Cipro. It has been 5 weeks of antibiotic therapy, we will order plain film xray to evaluate for osteomyelitis next week, prior to follow up with ID. Will continue with silvercel and follow up next week 05/16/17-he is here in follow-up evaluation for multiple ulcerations to multiple toes bilaterally and new wound to the right posterior heel. There is essentially no change in appearance, deteriorating measurements; he has a history of waxing and waning measurements. He admits that he  continues to apply moisturizer/cream/ointment to his toes despite weekly reminders to only apply silvercel to his toes. He states that he thinks the surgical shoe contributed to the superficial ulcer to his posterior heel, he is unable to articulate if this was an area of dry cracked skin as he has a similar area to the left heel. He now is wearing open toed slippers. He has an appointment with Dr. Ola Spurr on 2/11. We have ordered x-rays for her bilateral feet; he was advised to obtain the x-rays today or tomorrow. He will follow-up next week 05/23/17-he is here in follow-up evaluation for multiple ulcerations to multiple toes bilaterally and the right posterior heel. There is improvement in maceration. He has been compliant and not applying any moisturizing agent to his toes. He has been using Lac-Hydrin for his lower extremities with improvement. He did not go to his appointment on Monday with Dr. Ola Spurr secondary to financial concerns. X-rays for her bilateral feet showed: LEFT FOOT with slight erosion of the tuft of the distal phalanges of the left first and second toe suspicious for osteomyelitis, RIGHT FOOT with 1.erosion of the tufts of the distal Hobby, Rayce E. ([id number]) phalanges of the right first second and possibly third toes consistent with osteomyelitis, 2 no definitive abnormality of the calcaneus is seen on the images obtained, 3. Plantar calcaneal degenerative spur. We briefly discussed hyperbaric adjunctive therapy for treatment of chronic refractory osteomyelitis. I do not find an a1c in  EMR, will contact PCP for record, or order if needed. He has been encouraged to contact Dr Ola Spurr office regarding the follow-up appointment, encouraged him to inquire about payment plan. We will continue with same treatment plan and follow-up next week. He states he is still taking antibiotics and has "a lot" left. He states he has been taking them as directed, 2 pills twice daily.  According to Dr. Blane Ohara office notes he was originally started on 12/17 for 4 weeks and extended on 1/14 for an additional 4 weeks. He should be done with his antibiotic therapy, he was advised to bring his bottles and to his next appointment, we will contact pharmacy. 05/30/17-he is here in follow-up evaluation for multiple ulcerations to multiple toes bilaterally and the right posterior heel. He is accompanied by his brother-in-law. Wounds are stable. He has yet to make up with Dr. Ola Spurr. We contacted his PCP, with no record of recent A1c we will draw an A1c. His brother-in-law states that he was taken off all of his diabetic medication secondary to kidney function. 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 The antibiotics were counted and have approximately 2 weeks left, although they should be complete. The pill organizer reveals missing days. We discussed the need for consistent medications, to have optimal benefit of medication. He has a cousin that lives with him and he will ask her to check his organizer daily. He has been advised to follow up with Dr Ola Spurr, and will go by the office today. He has been advised to quit smoking. 06/06/17-he is here in follow up evaluation. He has had to make an appointment with Dr. Ola Spurr. He did have blood work obtained, a1c 5. He continues to take antibiotic therapy. Significant improvement in bilateral lower extremity edema with compression therapy. Essentially no change in ulcerations to toes. He states he is "going to try something different" and "let me know next week" if it works; he would not provide any additional information and was encouraged to follow our orders. We will follow up next week 06/13/17-he is here in follow-up evaluation. He has an appointment with Dr. Ola Spurr tomorrow morning. He states he purchased an ointment from  [facility] and applied to his toes for 3 days, he does not remember the name of the ointment. There is improvement to his wounds, minimal maceration. He continues to take antibiotic therapy, this should have been completed last month. His brother-in-law who regularly accompanies his appointments was asked to take the bottles to the appointment tomorrow with Dr. Ola Spurr so he is aware. We will continue with 3 layer compression, and order OPEN TOE compression 20-30mmHg; we will apply compression stockings next week. He continues to smoke, smoked "2 cigarettes" last week 06/20/17 on evaluation today patient did receive his compression stockings which he has with him today for both lower extremities. With that being said he tells me at this point in time that he is very happy to have these he really is not a big fan of the compression wraps that we have been utilizing although they have been of great benefit for him. Nonetheless at this point he does want to switch to the compression stockings. In my opinion as long as he is continuing with compression I'm okay with the stockings or the wraps. 06/27/17-he is here in follow-up evaluation for multiple ulcerations to his bilateral toes. There is some improvement in appearance. He is compliant  in wearing his compression stockings with significant improvement in lower extremity edema. He saw Dr Ola Spurr on 3/8, per his notes they would redraw ESR and CRP; plan to continue antibiotic therapy if these remain elevated. I do not see an ESR or CRP level in Epic. The patient continues to take antibiotics. 07/11/17-He is here in follow-up evaluation for multiple ulcerations to multiple toes bilaterally. He presents with complete epithelialization to the right third toe; there has been no deterioration. He continues on antibiotic therapy. He will follow-up next week 07/18/17-He is here in follow-up evaluation for multiple ulcerations to multiple toes bilaterally. He  continues to make improvement. He continues on antibiotic therapy. He states he has been using something additional to our orders, he does not elaborate but states he will bring it in next week. 07/25/17-He is here in follow up evaluation for multiple ulcerations to bilateral toes. He is stable. He has completed antibiotic therapy. He admits to "filing" his toes after showers each evening, this is what he was referencing last week; he does not filing for the wounds. We will switch to [facility] and monitor for any improvement, he will follow-up next week 08/01/17-He is here in follow-up evaluation. He admits to "picking" at his toes after cleansing yesterday, leading to new areas of tissue loss on the bilateral second toe. There is improvement noted to the bilateral great toe. We will dress toes today and hope that they maintain until Monday where he will come in for a nurse visit. He has been advised, multiple times with expressed verbalization, to change the dressings to silvercel if the dressings get wet prior to [facility] appointment. 08/08/17-He is here in follow-up evaluation for bilateral first and second toe ulcerations. There is significant improvement to all ulcerations since last visit. We will switch to [name] to all wounds and he will continue with nurse visits on a Monday/Thursday schedule and follow-up with me in 2 weeks. He continues to smoke, 1-3 cigarettes per day, and has been encouraged to not smoke until his next follow-up in 2 weeks. 08/15/17 on evaluation today patient's ulcers on his toes actually appear to be doing fairly well the right to ulcers may be a little bit more moist compared to the left I'm not really sure exactly why as the openings appear to be very small and I'm not seeing any evidence of anything significant as far as you lightest or otherwise. Nonetheless this may just be a small setback he's Baskett, Braian E. ([id number]) been doing very well with the  [facility] Dressing 08/22/17-He is here in follow-up evaluation for ulcerations to his bilateral first and second toes, there is small/scant amount of drainage noted on today's dressing. He continues with Hydrofera Blue. He continues to smoke, 1 cigarette a day. Voices no complaint or concerns, compliant and compression therapy today. He'll follow-up next week 08/29/17-He is here in follow-up evaluation for ulcerations to his bilateral first and second toes, with a new wound to the left third toe. He states he cut himself while cutting the toenail. He presents today with more maceration and increased measurements; waxing and waning measurements and moisture has been an ongoing issue. He continues to smoke a proximally 1 cigarette per day, but states he has not smoked since Sunday. We will initiate medihoney daily and evaluate next week. He presents today without compression stockings 09/05/17-He is here in follow-up evaluation for ulcerations to bilateral first and second and left third toe. He did not pick up the  medihoney and therefore has not been changing his dressing. He admits to inconsistent where of compression stockings, admits to pain to his bilateral lower extremities with unilateral edema (right greater than left). The ulcerations to multiple toes are more macerated and larger in size than last week. He is wearing compression therapy today. He admits to an overall feeling of weakness and fatigue and has been encouraged to contact his PCP for evaluation; he has a known history of anemia and intraabdominal lymphadenopathy. We will return to silvercel and he will follow up next week 09/19/17-He is here in follow-up evaluation for ulcerations to bilateral first and second toe.he was unable to make last week's appointment. The culture that was obtained on 5/30 grew clindamycin sensitive MRSA; he was initiated on 6/4 but did not start it until 6/7. There is significant improvement in all  wounds, healing to left second and third toe. He also admits to having no alcohol or smoking for the last 6 days. We will continue with same treatment plan I will extend the clindamycin for an additional 10 days and he will follow-up next week. 09/26/17- He is here in follow evaluation for multiple ulcerations to multiple toes bilaterally. He continues to be non-compliant with dressing, compression therapy. He states he is taking the antibiotics as prescribed, although his recall is inconsistent. There is noted deterioration in all ulcers. We will continue same treatment plan, antibiotic therapy and he will follow up next week. 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 This information is obtained from the medical record, as he cannot articulate specifics to his diagnosis or treatment plan. 10/17/17-He is here in follow-up evaluation for multiple ulcerations to multiple toes bilaterally. There is chronic waxing and waning improvement/deterioration. On today's exam they appear improved, dry without maceration. He continues to apply a variety of topical agents, stating that he has switched between the blue product and silver product. In my opinion, this will be a chronic waxing and waning giving his intermittent compliance. He will follow-up in 2 weeks. As it relates to the biopsy taken from his buttock, he has not followed up with that and has not started radiation therapy. 10/31/17-He is here for evaluation for multiple ulcerations to multiple toes bilaterally. Continues with waxing and waning improvement versus deterioration. They are stable on today's exam without significant maceration. 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 We will continue with every 2 week follow-up appointments 11/14/17- He  is seen in follow-up evaluation for multiple ulcerations to multiple bilateral toes. He is scheduled for excision of basal cell carcinoma to his right buttock on Monday 8/12. He continues to have chronic waxing and waning of his ulcers; today is a stable day with minimal drainage and left ear is better than right toes. I will recount to infectious disease regarding chronic suppressive therapy. He admits to being more compliant with compression stockings, although he is not wearing any today. He has been encouraged to continue with dressing changes as ordered and compression therapy daily. He will be seen in 2 weeks 11/28/17-He is seen in follow up evaluation for multiple ulcerations to multiple toes bilaterally. He had an overnight admission at [facility] (8/11-8/12) for symptomatic anemia, received two units prbc. His surgical date has been moved to 9/94 excision of basal cell carcinoma to  the right buttock. He has been noncompliant in wearing compression stockings, applying topical treatment as ordered; he has been applying a "cream", Hydrofera Blue, silvercel. He is currently on no antibiotic therapy; we will send for re-referral to ID for suppressive therapy given his multiple co-morbidities, poor surgical candidate, unwilling to have toe amputation and general non-adherence. 12/19/17 on evaluation today patient actually appears to be doing very well in regard to his toes at least compared to last time I saw him. He again has been coming to our office for quite a bit of time. 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 He states is hurting a little bit but doing well in general. I'm see were not taking care of this area dermatology is. He does have his appointment with infectious disease upcoming which they re-consult for suppressive therapy that's next Monday. 01/02/18 on evaluation  today patient actually appears to be doing rather well in regard to his toe ulcers although he tells me he has been put in an ointment on this which I am concerned may be causing some additional moisture buildup which we really do not want. He supposed be using silver cell which is mainly thing I want him to be utilizing. Fortunately there does not appear to be any evidence of worsening infection which is good news. He has had surgery on his gluteal region where he had an excision of a cancerous area fortunately that seems to be doing well although he is having some discomfort. 02/06/18 on evaluation today patient actually appears to be doing well at all locations except for his second toe use bilaterally Sinor, Dunbar E. ([id number]) where the distal tip is still wrong open. He did state that he can't stand for dry skin to buildup on his toes subsequently as a question and further it sounds as if he actually pills this all which is likely keeping the toe is wrong and open. Also think that's what happened in the bottom of his foot although that appears to have healed at this point. There does not appear to be any evidence of infection. 02/20/18 on evaluation today patient appears to be doing better in regard to his foot ulcers. Everything has shown signs of improvement which is excellent news. Overall I'm very pleased with how things appear currently. The patient states he's not picking her point at any skin and that he wears shoes at all times when he is ambulating at home. Both are things that I've advocated and recommended for him in the past. 03/13/18 on evaluation today patient actually appears to be doing very well in regard to the bilateral toes in the overall appearance of the wound bed locations. Fortunately there does not appear to be any signs of infection at this time. He states that he is very pleased with how things seem to be progressing to be honest as am I. 03/27/1899 evaluation today  patient appears to be doing well in regard to his toe ulcers. He just has a few areas remaining and these appear to be showing signs of improvement as well which is great news. Overall I have been impressed with the progress of the past several weeks. 04/10/18 on evaluation today patient appears to be doing a little bit worse in regard to the right plantar foot at the regional the first metatarsal as well as the left second toe. He tells me that  he ran out of bandages he also tells me he's been wearing his flip-flops a lot at home as well as a new pair of boots that he received as well. This is as opposed to the postop shoes that we had previously given him. Nonetheless overall I feel like that he has had a little bit more friction to the area which is causing things to look a little bit worse today. 04/24/18 on evaluation today patient's wounds for the most part appear to be doing excellent especially in regard to the bilateral second toes. With that being said he does have issues with the wound still on the first metatarsal region and first toe of the right foot which have not completely closed at this point. Nonetheless I do feel like that he is making progress which is good news. 05/08/18 on evaluation today patient's wounds appear to be doing just a little bit worse than normal simply due to the fact that he has not had dressing supplies. He apparently owed $35 in order for them to be shipped and unfortunately is not able to pay that currently. We did give him information for the [employer] today which will hopefully help cover his dressing supplies. He was thankful for this he states is gonna contact them this afternoon. Fortunately otherwise or does not hear any signs of infection. 05/22/18 on evaluation today patient appears to be doing somewhat poorly in regard to his toes. Upon further questioning it appears that he actually has been pulling skin off of the toes which "drive  him crazy". Subsequently as he does this he calls his new areas to tear open and I think this is one of the reasons why his toes are not healing as appropriately as we would like. He fortunately has no signs of infection at this time. With that being said I'm still concerned about the possibility of a fungal infection even though there doesn't appear to be anything bacterial. Potentially nystatin powder could be of benefit for him as far as trying to help this in that regard. 06/05/18 on evaluation today patient actually appears to be doing very well in regard to his foot ulcers. Fortunately there's no signs of infection. He's been tolerating the dressing changes without complication. I'm very pleased with how things seem to be progressing. Fortunately there's no evidence of active infection at this point I do believe nystatin powder has been of benefit for him. 06/19/18 on evaluation today patient actually appears to be doing a little worse compared to last evaluation. Tells me that he has not had any supplies for two weeks. With that being said his plantar foot wounds on the right actually appear to be doing very well. It's actually a second toes in the first toes that are the worst. I think these are areas the was able to get to and pick and he tells me in fact that he destine to pick out some of the dry skin because it causes him annoyance. Nonetheless I think this is somewhat detrimental to him I think it's worth keeping part of the wound open in particular. Fortunately there is no sign of infection at this time although I think that is a risk which I discussed specifically that he's not careful you can end up reopening this and causing greater problems for himself. 07/03/18 on evaluation today patient's wound bed actually shows evidence of good granulation at this time and epithelialization as far as the wounds in general are concerned. He  has been tolerating the dressing changes including the  nystatin without complication. This is good news. With that being said he seems to be still having some trouble with the left second toe distally at this point. ISIAIH, HOLLENBACH ([id number]) Electronic Signature(s) Signed: 07/04/2018 8:34:45 AM By: Worthy Keeler PA-C Entered By: Worthy Keeler on 07/03/2018 09:09:55 Tugwell, [name] ([id number]) -------------------------------------------------------------------------------- Physical Exam Details Patient Name: Heng, [name] Date of Service: 07/03/2018 8:30 AM Medical Record Number: [id number] Patient Account Number: [id number] Date of Birth/Sex: 1951-02-02 (68 y.o. M) Treating RN: Army Melia Primary Care Provider: Lamonte Sakai Other Clinician: Referring Provider: Lamonte Sakai Treating Provider/Extender: Melburn Hake, Dazha Kempa Weeks in Treatment: 39 Constitutional Well-nourished and well-hydrated in no acute distress. Respiratory normal breathing without difficulty. clear to auscultation bilaterally. Cardiovascular regular rate and rhythm with normal S1, S2. Psychiatric this patient is able to make decisions and demonstrates good insight into disease process. Alert and Oriented x 3. pleasant and cooperative. Notes Upon evaluation today patient's wound did not require any sharp debridement any locations currently. Overall I feel like he is doing quite well especially in regard to his right foot his left toes are also doing well to for the second toe which is a little bit more open and draining. I suggested at this point that he needs to be extra cautious with this time in order to allow it to improve and get better as well. Otherwise I think the current treatment regimen is good for him. Electronic Signature(s) Signed: 07/04/2018 8:34:45 AM By: Worthy Keeler PA-C Entered By: Worthy Keeler on 07/03/2018 09:10:50 Daoust, [name] ([id number]) -------------------------------------------------------------------------------- Physician Orders  Details Patient Name: Vandewater, [name] Date of Service: 07/03/2018 8:30 AM Medical Record Number: [id number] Patient Account Number: [id number] Date of Birth/Sex: 1951/02/19 (68 y.o. M) Treating RN: Army Melia Primary Care Provider: Lamonte Sakai Other Clinician: Referring Provider: Lamonte Sakai Treating Provider/Extender: Melburn Hake, Vicy Medico Weeks in Treatment: 62 Verbal / Phone Orders: No Diagnosis Coding ICD-10 Coding Code Description L97.521 Non-pressure chronic ulcer of other part of left foot limited to breakdown of skin L97.511 Non-pressure chronic ulcer of other part of right foot limited to breakdown of skin E11.621 Type 2 diabetes mellitus with foot ulcer I87.323 Chronic venous hypertension (idiopathic) with inflammation of bilateral lower extremity I89.0 Lymphedema, not elsewhere classified F17.218 Nicotine dependence, cigarettes, 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 abuse 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, right ankle and foot Wound Cleansing Wound #14 Left Toe Second o Clean wound with Normal Saline. Wound #16 Right,Plantar Toe Great o Clean wound with Normal Saline. Wound #17 Right,Distal,Plantar Metatarsal head second o Clean wound with Normal Saline. Wound #18 Right,Dorsal Toe Great o Clean wound with Normal Saline. Wound #2 Right Toe Second o Clean wound with Normal Saline. Anesthetic (add to Medication List) Wound #14 Left Toe Second o Topical Lidocaine 4% cream applied to wound bed prior to debridement (In Clinic Only). Wound #16 Right,Plantar Toe Great o Topical Lidocaine 4% cream applied to wound bed prior to debridement (In Clinic Only). Wound #17 Right,Distal,Plantar Metatarsal head second o Topical Lidocaine 4% cream applied to wound bed prior to debridement (In Clinic Only). Wound #18 Right,Dorsal Toe Great o Topical Lidocaine 4% cream applied to wound bed prior to debridement  (In Clinic Only). Wound #2 Right Toe Second o Topical Lidocaine 4% cream applied to wound bed prior to debridement (In Clinic Only). GEREMIAH, FUSSELL ([id number]) Skin Barriers/Peri-Wound Care Wound #14 Left Toe Second   o Antifungal powder-Nystatin Wound #16 Right,Plantar Toe Great o Antifungal powder-Nystatin Wound #17 Right,Distal,Plantar Metatarsal head second o Antifungal powder-Nystatin Wound #18 Right,Dorsal Toe Great o Antifungal powder-Nystatin Wound #2 Right Toe Second o Antifungal powder-Nystatin Primary Wound Dressing Wound #14 Left Toe Second o Silver Alginate - secure with tape Wound #16 Right,Plantar Toe Great o Silver Alginate - secure with tape Wound #17 Right,Distal,Plantar Metatarsal head second o Silver Alginate - secure with tape Wound #18 Right,Dorsal Toe Great o Silver Alginate - secure with tape Wound #2 Right Toe Second o Silver Alginate - secure with tape Dressing Change Frequency Wound #14 Left Toe Second o Change dressing every day. Wound #16 Right,Plantar Toe Great o Change dressing every day. Wound #17 Right,Distal,Plantar Metatarsal head second o Change dressing every day. Wound #18 Right,Dorsal Toe Great o Change dressing every day. Wound #2 Right Toe Second o Change dressing every day. Follow-up Appointments Wound #14 Left Toe Second o Return Appointment in 2 weeks. Wound #16 Right,Plantar Toe Great o Return Appointment in 2 weeks. Wound #17 Right,Distal,Plantar Metatarsal head second Messman, Tashawn E. ([id number]) o Return Appointment in 2 weeks. Wound #18 Right,Dorsal Toe Great o Return Appointment in 2 weeks. Wound #2 Right Toe Second o Return Appointment in 2 weeks. Edema Control Wound #14 Left Toe Second o Patient to wear own compression stockings Wound #16 Right,Plantar Toe Great o Patient to wear own compression stockings Wound #17 Right,Distal,Plantar Metatarsal head second o  Patient to wear own compression stockings Wound #18 Right,Dorsal Toe Great o Patient to wear own compression stockings Wound #2 Right Toe Second o Patient to wear own compression stockings Additional Orders / Instructions Wound #14 Left Toe Second o Stop Smoking o Increase protein intake. Wound #16 Right,Plantar Toe Great o Stop Smoking o Increase protein intake. Wound #17 Right,Distal,Plantar Metatarsal head second o Stop Smoking o Increase protein intake. Wound #18 Right,Dorsal Toe Great o Stop Smoking o Increase protein intake. Wound #2 Right Toe Second o Stop Smoking o Increase protein intake. Medications-please add to medication list. Wound #14 Left Toe Second o Other: - Nystatin Powder Wound #16 Right,Plantar Toe Great o Other: - Nystatin Powder Wound #17 Right,Distal,Plantar Metatarsal head second o Other: - Nystatin Powder Wound #18 Right,Dorsal Toe Lyam Provencio, Starlin E. ([id number]) o Other: - Nystatin Powder Wound #2 Right Toe Second o Other: - Nystatin Powder Electronic Signature(s) Signed: 07/03/2018 9:15:54 AM By: Army Melia Signed: 07/04/2018 8:34:45 AM By: Worthy Keeler PA-C Entered By: Army Melia on 07/03/2018 09:03:58 Duley, [name] ([id number]) -------------------------------------------------------------------------------- Problem List Details Patient Name: Maradiaga, Jack E. Date of Service: 07/03/2018 8:30 AM Medical Record Number: [id number] Patient Account Number: [id number] Date of Birth/Sex: 11-08-1950 (68 y.o. M) Treating RN: Army Melia Primary Care Provider: Lamonte Sakai Other Clinician: Referring Provider: Lamonte Sakai Treating Provider/Extender: Melburn Hake, HOYT Weeks in Treatment: 78 Active Problems ICD-10 Evaluated Encounter Code Description Active Date Today Diagnosis L97.521 Non-pressure chronic ulcer of other part of left foot limited to 09/21/2016 No Yes breakdown of skin L97.511 Non-pressure  chronic ulcer of other part of right foot limited to 09/21/2016 No Yes breakdown of skin E11.621 Type 2 diabetes mellitus with foot ulcer 09/21/2016 No Yes I87.323 Chronic venous hypertension (idiopathic) with inflammation of 09/21/2016 No Yes bilateral lower extremity I89.0 Lymphedema, not elsewhere classified 09/21/2016 No Yes F17.218 Nicotine dependence, cigarettes, with other nicotine-induced 09/21/2016 No Yes disorders F10.19 Alcohol abuse with unspecified alcohol-induced disorder 09/21/2016 No Yes M86.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, right ankle and foot 02/21/2017 No  Yes Inactive Problems Resolved Problems Spies, NYZAIAH KAI ([id number]) Electronic Signature(s) Signed: 07/04/2018 8:34:45 AM By: Worthy Keeler PA-C Entered By: Worthy Keeler on 07/03/2018 08:45:26 Springs, [name] ([id number]) -------------------------------------------------------------------------------- Progress Note Details Patient Name: Glogowski, [name] Date of Service: 07/03/2018 8:30 AM Medical Record Number: [id number] Patient Account Number: [id number] Date of Birth/Sex: Jan 19, 1951 (68 y.o. M) Treating RN: Army Melia Primary Care Provider: Lamonte Sakai Other Clinician: Referring Provider: Lamonte Sakai Treating Provider/Extender: Melburn Hake, HOYT Weeks in Treatment: 6 Subjective Chief Complaint Information obtained from Patient Patient presents for treatment of an open diabetic ulcer to both feet History of Present Illness (HPI) 69 year old patient here to see as for bilateral feet ulceration to on his left first and second toe and 2 on his right first and second toe, which she's had for about 4 months. 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 Donella Stade. The patient is also undergoing workup by medical oncology for a intra-abdominal lymphadenopathy. 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 He is also status post appendectomy, inguinal lymph node biopsy, rectal biopsy and rectal examination under anesthesia. he currently smokes cigarettes about half packet a day. In March of this year he was seen by Dr. Hortencia Pilar, for evaluation of bilateral varicose veins and besides wearing compression stockings he had recommended laser ablation of the right and left great saphenous veins to eleviate the symptoms and complications of severe superficial venous reflux disease. He also recommended lymphedema pumps for better control of his lymphedema. The patient recently has had on 08/23/2016, right greater saphenous vein ablation with the laser energy Earlier lower extremity venous reflux examination done on 05/08/2016 showed no DVT or SVT both lower legs but incompetence of bilateral great saphenous veins was present. A lower arterial study was also done and there was no significant right lower and left lower extremity problems based on a normal toe brachial index bilaterally and the ABI was 1.21 the left and 1.23 on the right. His post ablation venous duplex examination showed successful ablation of the right GS vein with thrombus formation 2 below the right saphenofemoral junction. The deep system was patent without evidence of thrombosis and this was done on 08/30/2016. 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 Addendum: regarding his x-rays done today and x-ray of the left foot -- IMPRESSION: No objective evidence of osteomyelitis. There are soft tissue changes which may reflect cellulitis. X-ray of the right foot -- IMPRESSION:Findings compatible with cellulitis of the toes. No objective evidence of osteomyelitis is observed. 10/01/16 on  evaluation today patient's wounds appeared to be doing some better. I did review the x-rays as well which showed no evidence of osteomyelitis although there was evidence on x-ray of cellulitis. He fortunately is not having any discomfort although he continues to have some swelling. He does not remember being on any antibiotics recently. 10/15/16 on evaluation today patient's wounds overall appear to be doing better although he does have a new location noted on the left foot. Fortunately he is not having significant pain. It almost has the appearance that something is rubbing on the end of his toes but he wears the open  toe shoes and according to what he is telling me never wears anything that would rub on his foot. There is no evidence of infection and specifically no evidence of a fungal infection 10/22/16 On evaluation today patient's wounds appeared to be doing better compared to last week in regard to his bilateral lower extremities. Fortunately I happy with how things are progressing although he still has ulcers I feel like that he is improving and appropriate manner. DWIGHT, ADAMCZAK ([id number]) 11/12/16 on evaluation today patient appears to be doing well in regard to his bilateral feet and the respective wounds. We have been using surrounding her dressings along with an antifungal cream which seems to be doing very well. He has no bilateral dysfunction noticed that the rituals are weight loss at this point. He also has no nausea or vomiting a note purulent discharge. He did see Vein and vascular today and he tells me that they told him he could have surgery for his venous stasis but they did not feel like it was worth it in his words. Fortunately patient's wounds do appear to be getting sneakily better. 11/26/2016 -- he says he is going to have some surgery during this week at [facility] for possibly a colon resection. 12/31/2016 -- the patient has been noncompliant with his smoking and I'm not  sure whether he is also started drinking again. He continues to be very nonchalant about his care 01/14/2017 -- the patient's HandP has been reviewed well and I understand he is being compliant with trying to give up smoking and his local dressing changes. He does not have any surgical options of 4 to him by his vascular surgeons.he was last seen in early August by Dr. Hortencia Pilar who recommended compression stockings,and possibly lymph pumps in 2-3 months after doing a review ultrasound. 01/28/2017 - the patient did not have any fresh complaints but on examination I noted a large lacerated wound on the plantar aspect of his right fourth toe which had a lot of necrotic debris and it probes down to bone. 02/07/2017 -- x-ray of the right foot -- IMPRESSION: Soft tissue swelling about the first through fourth toes consistent with cellulitis. New destructive change in the tuft of the distal phalanx of the great toe is consistent with osteomyelitis. 02/14/2017 -- the patient's MRI is pending this coming Monday and he still continues to smoke. We have again gone over off loading of his wounds in great detail and he says he's been compliant. 02/21/2017 -- MR of the right foot -- IMPRESSION: 1. Soft tissue ulcer at the tip of the first, second and third toe knows. Cortical irregularity and bone marrow edema in the first distal phalanx most concerning for osteomyelitis. Mild marrow edema in the second and third distal phalanx without definite cortical destruction which may reflect early osteomyelitis versus reactive marrow edema. 2. Soft tissue edema surrounding the first phalanx most consistent with cellulitis. the patient was also recently evaluated by his medical oncologist Dr. 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 She is treating him with weekly Procrit. She is also keeping intra-abdominal lymphadenopathy and right lower  lobe lung nodule under observation. 04/04/2017 -- he was seen by Dr. Adrian Prows on 03/25/2017 -- after review he empirically put him on ciprofloxacin and doxycycline as they have good bone penetration and good bioavailability and it will cover the usual pathogens and diabetic foot osteomyelitis. He will check inflammatory  markers and plan a 78-3 week old records. C-reactive protein was 0.3 and the ESR was 72 04/18/17 on evaluation today patient appears to be doing about the same in regard to his lower extremity wounds bilaterally. He has continued to use the antifungal cream which does seem to be beneficial. Nonetheless the ulcers do seem to in some areas be epithelial eyes over and in other areas are still open. He is having no significant discomfort. 04/25/17-he is here in follow-up evaluation for multiple ulcerations to multiple toes bilaterally. He states he did see Dr. Ola Spurr again last week and continues antibiotic therapy. He is voicing no complaints or concerns, will continue with current treatment plan will possibility of adding compression therapy next week after an additional week of treatment/lotions to BLE prescribed by Dr Ola Spurr 05/02/17 he is here in follow up for for multiple ulcers to multiple toes bilaterally. we will stop using antifungal cream and will continue with silvercel and follow up next week 05/09/17-he is here in follow-up for multiple ulcerations to multiple toes bilaterally. There is improvement in appearance. He has not completely stopped using antifungal cream, but admits he has not using it between the toes. He has an appointment with Dr. Ola Spurr on 2/11, continues on doxycycline and Cipro. It has been 5 weeks of antibiotic therapy, we will order plain film xray to evaluate for osteomyelitis next week, prior to follow up with ID. Will continue with silvercel and follow up next week 05/16/17-he is here in follow-up evaluation for multiple ulcerations to  multiple toes bilaterally and new wound to the right posterior heel. There is essentially no change in appearance, deteriorating measurements; he has a history of waxing and waning measurements. He admits that he continues to apply moisturizer/cream/ointment to his toes despite weekly reminders to only apply silvercel to his toes. He states that he thinks the surgical shoe contributed to the superficial ulcer to his posterior heel, he is unable to articulate if this was an area of dry cracked skin as he has a similar area to the left heel. He now is wearing open toed slippers. He has an appointment with Dr. Ola Spurr on 2/11. We have ordered x-rays for her bilateral feet; Hoffmaster, Sha E. ([id number]) he was advised to obtain the x-rays today or tomorrow. He will follow-up next week 05/23/17-he is here in follow-up evaluation for multiple ulcerations to multiple toes bilaterally and the right posterior heel. There is improvement in maceration. He has been compliant and not applying any moisturizing agent to his toes. He has been using Lac-Hydrin for his lower extremities with improvement. He did not go to his appointment on Monday with Dr. Ola Spurr secondary to financial concerns. X-rays for her bilateral feet showed: LEFT FOOT with slight erosion of the tuft of the distal phalanges of the left first and second toe suspicious for osteomyelitis, RIGHT FOOT with 1.erosion of the tufts of the distal phalanges of the right first second and possibly third toes consistent with osteomyelitis, 2 no definitive abnormality of the calcaneus is seen on the images obtained, 3. Plantar calcaneal degenerative spur. We briefly discussed hyperbaric adjunctive therapy for treatment of chronic refractory osteomyelitis. I do not find an a1c in EMR, will contact PCP for record, or order if needed. He has been encouraged to contact Dr Ola Spurr office regarding the follow-up appointment, encouraged him to inquire about  payment plan. We will continue with same treatment plan and follow-up next week. He states he is still taking antibiotics and has "a  lot" left. He states he has been taking them as directed, 2 pills twice daily. According to Dr. Blane Ohara office notes he was originally started on 12/17 for 4 weeks and extended on 1/14 for an additional 4 weeks. He should be done with his antibiotic therapy, he was advised to bring his bottles and to his next appointment, we will contact pharmacy. 05/30/17-he is here in follow-up evaluation for multiple ulcerations to multiple toes bilaterally and the right posterior heel. He is accompanied by his brother-in-law. Wounds are stable. He has yet to make up with Dr. Ola Spurr. We contacted his PCP, with no record of recent A1c we will draw an A1c. His brother-in-law states that he was taken off all of his diabetic medication secondary to kidney function. 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 The antibiotics were counted and have approximately 2 weeks left, although they should be complete. The pill organizer reveals missing days. We discussed the need for consistent medications, to have optimal benefit of medication. He has a cousin that lives with him and he will ask her to check his organizer daily. He has been advised to follow up with Dr Ola Spurr, and will go by the office today. He has been advised to quit smoking. 06/06/17-he is here in follow up evaluation. He has had to make an appointment with Dr. Ola Spurr. He did have blood work obtained, a1c 5. He continues to take antibiotic therapy. Significant improvement in bilateral lower extremity edema with compression therapy. Essentially no change in ulcerations to toes. He states he is "going to try something different" and "let me know next week" if it works; he would not provide any additional information  and was encouraged to follow our orders. We will follow up next week 06/13/17-he is here in follow-up evaluation. He has an appointment with Dr. Ola Spurr tomorrow morning. He states he purchased an ointment from [facility] and applied to his toes for 3 days, he does not remember the name of the ointment. There is improvement to his wounds, minimal maceration. He continues to take antibiotic therapy, this should have been completed last month. His brother-in-law who regularly accompanies his appointments was asked to take the bottles to the appointment tomorrow with Dr. Ola Spurr so he is aware. We will continue with 3 layer compression, and order OPEN TOE compression 20-30mmHg; we will apply compression stockings next week. He continues to smoke, smoked "2 cigarettes" last week 06/20/17 on evaluation today patient did receive his compression stockings which he has with him today for both lower extremities. With that being said he tells me at this point in time that he is very happy to have these he really is not a big fan of the compression wraps that we have been utilizing although they have been of great benefit for him. Nonetheless at this point he does want to switch to the compression stockings. In my opinion as long as he is continuing with compression I'm okay with the stockings or the wraps. 06/27/17-he is here in follow-up evaluation for multiple ulcerations to his bilateral toes. There is some improvement in appearance. He is compliant in wearing his compression stockings with significant improvement in lower extremity edema. He saw Dr Ola Spurr on 3/8, per his notes they would redraw ESR and CRP; plan to continue antibiotic therapy if these remain elevated. I do not see an ESR or CRP level in Epic. The patient continues to take  antibiotics. 07/11/17-He is here in follow-up evaluation for multiple ulcerations to multiple toes bilaterally. He presents with complete epithelialization to the  right third toe; there has been no deterioration. He continues on antibiotic therapy. He will follow-up next week 07/18/17-He is here in follow-up evaluation for multiple ulcerations to multiple toes bilaterally. He continues to make improvement. He continues on antibiotic therapy. He states he has been using something additional to our orders, he does not elaborate but states he will bring it in next week. 07/25/17-He is here in follow up evaluation for multiple ulcerations to bilateral toes. He is stable. He has completed antibiotic therapy. He admits to "filing" his toes after showers each evening, this is what he was referencing last week; he does not filing for the wounds. We will switch to [facility] and monitor for any improvement, he will follow-up next week 08/01/17-He is here in follow-up evaluation. He admits to "picking" at his toes after cleansing yesterday, leading to new areas of tissue loss on the bilateral second toe. There is improvement noted to the bilateral great toe. We will dress toes today and hope that they maintain until Monday where he will come in for a nurse visit. He has been advised, multiple times with expressed verbalization, to change the dressings to silvercel if the dressings get wet prior to [facility] appointment. 08/08/17-He is here in follow-up evaluation for bilateral first and second toe ulcerations. There is significant improvement to all ulcerations since last visit. We will switch to [facility] to all wounds and he will continue with nurse visits on a Mcclenahan, Romolo E. ([id number]) Monday/Thursday schedule and follow-up with me in 2 weeks. He continues to smoke, 1-3 cigarettes per day, and has been encouraged to not smoke until his next follow-up in 2 weeks. 08/15/17 on evaluation today patient's ulcers on his toes actually appear to be doing fairly well the right to ulcers may be a little bit more moist compared to the left I'm not really sure exactly why  as the openings appear to be very small and I'm not seeing any evidence of anything significant as far as you lightest or otherwise. Nonetheless this may just be a small setback he's been doing very well with the [facility] Dressing 08/22/17-He is here in follow-up evaluation for ulcerations to his bilateral first and second toes, there is small/scant amount of drainage noted on today's dressing. He continues with Hydrofera Blue. He continues to smoke, 1 cigarette a day. Voices no complaint or concerns, compliant and compression therapy today. He'll follow-up next week 08/29/17-He is here in follow-up evaluation for ulcerations to his bilateral first and second toes, with a new wound to the left third toe. He states he cut himself while cutting the toenail. He presents today with more maceration and increased measurements; waxing and waning measurements and moisture has been an ongoing issue. He continues to smoke a proximally 1 cigarette per day, but states he has not smoked since Sunday. We will initiate medihoney daily and evaluate next week. He presents today without compression stockings 09/05/17-He is here in follow-up evaluation for ulcerations to bilateral first and second and left third toe. He did not pick up the medihoney and therefore has not been changing his dressing. He admits to inconsistent where of compression stockings, admits to pain to his bilateral lower extremities with unilateral edema (right greater than left). The ulcerations to multiple toes are more macerated and larger in size than last week. He is wearing compression therapy  today. He admits to an overall feeling of weakness and fatigue and has been encouraged to contact his PCP for evaluation; he has a known history of anemia and intraabdominal lymphadenopathy. We will return to silvercel and he will follow up next week 09/19/17-He is here in follow-up evaluation for ulcerations to bilateral first and second toe.he was  unable to make last week's appointment. The culture that was obtained on 5/30 grew clindamycin sensitive MRSA; he was initiated on 6/4 but did not start it until 6/7. There is significant improvement in all wounds, healing to left second and third toe. He also admits to having no alcohol or smoking for the last 6 days. We will continue with same treatment plan I will extend the clindamycin for an additional 10 days and he will follow-up next week. 09/26/17- He is here in follow evaluation for multiple ulcerations to multiple toes bilaterally. He continues to be non-compliant with dressing, compression therapy. He states he is taking the antibiotics as prescribed, although his recall is inconsistent. There is noted deterioration in all ulcers. We will continue same treatment plan, antibiotic therapy and he will follow up next week. 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 This information is obtained from the medical record, as he cannot articulate specifics to his diagnosis or treatment plan. 10/17/17-He is here in follow-up evaluation for multiple ulcerations to multiple toes bilaterally. There is chronic waxing and waning improvement/deterioration. On today's exam they appear improved, dry without maceration. He continues to apply a variety of topical agents, stating that he has switched between the blue product and silver product. In my opinion, this will be a chronic waxing and waning giving his intermittent compliance. He will follow-up in 2 weeks. As it relates to the biopsy taken from his buttock, he has not followed up with that and has not started radiation therapy. 10/31/17-He is here for evaluation for multiple ulcerations to multiple toes bilaterally. Continues with waxing and waning improvement versus  deterioration. They are stable on today's exam without significant maceration. 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 We will continue with every 2 week follow-up appointments 11/14/17- He is seen in follow-up evaluation for multiple ulcerations to multiple bilateral toes. He is scheduled for excision of basal cell carcinoma to his right buttock on Monday 8/12. He continues to have chronic waxing and waning of his ulcers; today is a stable day with minimal drainage and left ear is better than right toes. I will recount to infectious disease regarding chronic suppressive therapy. He admits to being more compliant with compression stockings, although he is not wearing any today. He has been encouraged to continue with dressing changes as ordered and compression therapy daily. He will be seen in 2 weeks 11/28/17-He is seen in follow up evaluation for multiple ulcerations to multiple toes bilaterally. He had an overnight admission at [facility] (8/11-8/12) for symptomatic anemia, received two units prbc. His surgical date has been moved to 9/94 excision of basal cell carcinoma to the right buttock. He has been noncompliant in wearing compression stockings, applying topical treatment as ordered; he has been applying a "cream", Hydrofera Blue, silvercel. He is currently on no antibiotic therapy; we will send for re-referral to ID for suppressive therapy given his multiple co-morbidities, poor surgical candidate, unwilling to have  toe amputation and general non-adherence. 12/19/17 on evaluation today patient actually appears to be doing very well in regard to his toes at least compared to last time I saw him. He again has been coming to our office for quite a bit of time. 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 He states is hurting a little bit but doing well  in general. I'm see were not taking care of this area dermatology is. He does have his appointment with infectious disease upcoming which they re-consult for suppressive therapy that's next Monday. 01/02/18 on evaluation today patient actually appears to be doing rather well in regard to his toe ulcers although he tells me he Virtue, Brentton E. ([id number]) has been put in an ointment on this which I am concerned may be causing some additional moisture buildup which we really do not want. He supposed be using silver cell which is mainly thing I want him to be utilizing. Fortunately there does not appear to be any evidence of worsening infection which is good news. He has had surgery on his gluteal region where he had an excision of a cancerous area fortunately that seems to be doing well although he is having some discomfort. 02/06/18 on evaluation today patient actually appears to be doing well at all locations except for his second toe use bilaterally where the distal tip is still wrong open. He did state that he can't stand for dry skin to buildup on his toes subsequently as a question and further it sounds as if he actually pills this all which is likely keeping the toe is wrong and open. Also think that's what happened in the bottom of his foot although that appears to have healed at this point. There does not appear to be any evidence of infection. 02/20/18 on evaluation today patient appears to be doing better in regard to his foot ulcers. Everything has shown signs of improvement which is excellent news. Overall I'm very pleased with how things appear currently. The patient states he's not picking her point at any skin and that he wears shoes at all times when he is ambulating at home. Both are things that I've advocated and recommended for him in the past. 03/13/18 on evaluation today patient actually appears to be doing very well in regard to the bilateral toes in the overall appearance of the  wound bed locations. Fortunately there does not appear to be any signs of infection at this time. He states that he is very pleased with how things seem to be progressing to be honest as am I. 03/27/1899 evaluation today patient appears to be doing well in regard to his toe ulcers. He just has a few areas remaining and these appear to be showing signs of improvement as well which is great news. Overall I have been impressed with the progress of the past several weeks. 04/10/18 on evaluation today patient appears to be doing a little bit worse in regard to the right plantar foot at the regional the first metatarsal as well as the left second toe. He tells me that he ran out of bandages he also tells me he's been wearing his flip-flops a lot at home as well as a new pair of boots that he received as well. This is as opposed to the postop shoes that we had previously given him. Nonetheless overall I feel like that he  has had a little bit more friction to the area which is causing things to look a little bit worse today. 04/24/18 on evaluation today patient's wounds for the most part appear to be doing excellent especially in regard to the bilateral second toes. With that being said he does have issues with the wound still on the first metatarsal region and first toe of the right foot which have not completely closed at this point. Nonetheless I do feel like that he is making progress which is good news. 05/08/18 on evaluation today patient's wounds appear to be doing just a little bit worse than normal simply due to the fact that he has not had dressing supplies. He apparently owed $35 in order for them to be shipped and unfortunately is not able to pay that currently. We did give him information for the [employer] today which will hopefully help cover his dressing supplies. He was thankful for this he states is gonna contact them this afternoon. Fortunately otherwise or does not hear any  signs of infection. 05/22/18 on evaluation today patient appears to be doing somewhat poorly in regard to his toes. Upon further questioning it appears that he actually has been pulling skin off of the toes which "drive him crazy". Subsequently as he does this he calls his new areas to tear open and I think this is one of the reasons why his toes are not healing as appropriately as we would like. He fortunately has no signs of infection at this time. With that being said I'm still concerned about the possibility of a fungal infection even though there doesn't appear to be anything bacterial. Potentially nystatin powder could be of benefit for him as far as trying to help this in that regard. 06/05/18 on evaluation today patient actually appears to be doing very well in regard to his foot ulcers. Fortunately there's no signs of infection. He's been tolerating the dressing changes without complication. I'm very pleased with how things seem to be progressing. Fortunately there's no evidence of active infection at this point I do believe nystatin powder has been of benefit for him. 06/19/18 on evaluation today patient actually appears to be doing a little worse compared to last evaluation. Tells me that he has not had any supplies for two weeks. With that being said his plantar foot wounds on the right actually appear to be doing very well. It's actually a second toes in the first toes that are the worst. I think these are areas the was able to get to and pick and he tells me in fact that he destine to pick out some of the dry skin because it causes him annoyance. Nonetheless I think this is somewhat detrimental to him I think it's worth keeping part of the wound open in particular. Fortunately there is no sign of infection at this time although I think that is a risk which I discussed specifically that he's not careful you can end up reopening this and causing greater problems for himself. NEZIAH, BRALEY ([id number]) 07/03/18 on evaluation today patient's wound bed actually shows evidence of good granulation at this time and epithelialization as far as the wounds in general are concerned. He has been tolerating the dressing changes including the nystatin without complication. This is good news. With that being said he seems to be still having some trouble with the left second toe distally at this point. Patient History Information obtained from Patient. Social History Current every day  smoker, Marital Status - Widowed, Alcohol Use - Daily - quit drinking about a week ago, Drug Use - No History, Caffeine Use - Moderate. Medical History Eyes Denies history of Cataracts, Glaucoma, Optic Neuritis Ear/Nose/Mouth/Throat Denies history of Chronic sinus problems/congestion, Middle ear problems Hematologic/Lymphatic Patient has history of Anemia, Lymphedema Denies history of Hemophilia, Human Immunodeficiency Virus, Sickle Cell Disease Respiratory Denies history of Aspiration, Asthma, Chronic Obstructive Pulmonary Disease (COPD), Pneumothorax, Sleep Apnea, Tuberculosis Cardiovascular Patient has history of Congestive Heart Failure, Hypertension, Peripheral Venous Disease Denies history of Angina, Arrhythmia, Coronary Artery Disease, Deep Vein Thrombosis, Hypotension, Myocardial Infarction, Peripheral Arterial Disease, Phlebitis, Vasculitis Gastrointestinal Denies history of Cirrhosis , Colitis, Crohn s, Hepatitis A, Hepatitis B, Hepatitis C Endocrine Patient has history of Type II Diabetes Genitourinary Denies history of End Stage Renal Disease Immunological Denies history of Lupus Erythematosus, Raynaud s, Scleroderma Integumentary (Skin) Denies history of History of Burn, History of pressure wounds Musculoskeletal Denies history of Gout, Rheumatoid Arthritis, Osteoarthritis, Osteomyelitis Neurologic Patient has history of Neuropathy Denies history of Dementia, Quadriplegia,  Paraplegia, Seizure Disorder Medical And Surgical History Notes Oncologic 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 Review of Systems (ROS) Constitutional Symptoms (General Health) Denies complaints or symptoms of Fever, Chills. Respiratory The patient has no complaints or symptoms. Cardiovascular The patient has no complaints or symptoms. Psychiatric The patient has no complaints or symptoms. KELE, BARTHELEMY ([id number]) Objective Constitutional Well-nourished and well-hydrated in no acute distress. Vitals Time Taken: 8:33 AM, Height: 69 in, Weight: 168 lbs, BMI: 24.8, Temperature: 98.2 F, Pulse: 82 bpm, Respiratory Rate: 18 breaths/min, Blood Pressure: 182/96 mmHg. Respiratory normal breathing without difficulty. clear to auscultation bilaterally. Cardiovascular regular rate and rhythm with normal S1, S2. Psychiatric this patient is able to make decisions and demonstrates good insight into disease process. Alert and Oriented x 3. pleasant and cooperative. General Notes: Upon evaluation today patient's wound did not require any sharp debridement any locations currently. Overall I feel like he is doing quite well especially in regard to his right foot his left toes are also doing well to for the second toe which is a little bit more open and draining. I suggested at this point that he needs to be extra cautious with this time in order to allow it to improve and get better as well. Otherwise I think the current treatment regimen is good for him. Integumentary (Hair, Skin) Wound #14 status is Open. Original cause of wound was Gradually Appeared. The wound is located on the Left Toe Second. The wound measures 0.9cm length x 2.1cm width x 0.1cm depth; 1.484cm^2 area and 0.148cm^3 volume. There is Fat Layer (Subcutaneous Tissue) Exposed exposed. There is no tunneling or undermining noted. There is a medium amount of sanguinous drainage noted. The wound margin is  indistinct and nonvisible. There is large (67-100%) pink granulation within the wound bed. There is no necrotic tissue within the wound bed. The periwound skin appearance exhibited: Scarring, Maceration, Hemosiderin Staining. The periwound skin appearance did not exhibit: Callus, Crepitus, Excoriation, Induration, Rash, Dry/Scaly, Atrophie Blanche, Cyanosis, Ecchymosis, Mottled, Pallor, Rubor, Erythema. The periwound has tenderness on palpation. Wound #16 status is Open. Original cause of wound was Not Known. The wound is located on the [employer]. The wound measures 0.8cm length x 1cm width x 0.1cm depth; 0.628cm^2 area and 0.063cm^3 volume. There is Fat Layer (Subcutaneous Tissue) Exposed exposed. There is no tunneling or undermining noted. There is a medium amount of serous drainage noted. The  wound margin is flat and intact. There is large (67-100%) pink granulation within the wound bed. There is a small (1-33%) amount of necrotic tissue within the wound bed including Adherent Slough. The periwound skin appearance exhibited: Scarring, Maceration. The periwound skin appearance did not exhibit: Callus, Crepitus, Excoriation, Induration, Rash, Dry/Scaly, Atrophie Blanche, Cyanosis, Ecchymosis, Hemosiderin Staining, Mottled, Pallor, Rubor, Erythema. Periwound temperature was noted as No Abnormality. Wound #17 status is Open. Original cause of wound was Trauma. The wound is located on the Right,Distal,Plantar Metatarsal head second. The wound measures 0.6cm length x 0.1cm width x 0.1cm depth; 0.047cm^2 area and 0.005cm^3 volume. There is Fat Layer (Subcutaneous Tissue) Exposed exposed. There is no tunneling or undermining noted. There is a medium amount of serous drainage noted. The wound margin is flat and intact. There is large (67-100%) pink granulation within the wound bed. There is a small (1-33%) amount of necrotic tissue within the wound bed including Adherent Slough. The  periwound skin appearance exhibited: Scarring, Maceration. The periwound skin appearance did not exhibit: Callus, Crepitus, Excoriation, Induration, Rash, Dry/Scaly, Atrophie Blanche, Cyanosis, Ecchymosis, Hemosiderin Staining, Mottled, Pallor, Rubor, Erythema. Periwound temperature was noted as No Abnormality. ARYEH, BUTTERFIELD. ([id number]) Wound #18 status is Open. Original cause of wound was Gradually Appeared. The wound is located on the Right,[employer]orsal [employer] The wound measures 0.1cm length x 0.1cm width x 0.1cm depth; 0.008cm^2 area and 0.001cm^3 volume. There is no tunneling or undermining noted. There is a medium amount of serous drainage noted. The wound margin is flat and intact. There is large (67-100%) pink granulation within the wound bed. There is no necrotic tissue within the wound bed. The periwound skin appearance exhibited: Scarring, Maceration. The periwound skin appearance did not exhibit: Callus, Crepitus, Excoriation, Induration, Rash, Dry/Scaly, Atrophie Blanche, Cyanosis, Ecchymosis, Hemosiderin Staining, Mottled, Pallor, Rubor, Erythema. Periwound temperature was noted as No Abnormality. Wound #2 status is Open. Original cause of wound was Gradually Appeared. The wound is located on the Right Toe Second. The wound measures 0.1cm length x 0.1cm width x 0.1cm depth; 0.008cm^2 area and 0.001cm^3 volume. The wound is limited to skin breakdown. There is no tunneling or undermining noted. There is a medium amount of serous drainage noted. The wound margin is indistinct and nonvisible. There is large (67-100%) pink granulation within the wound bed. There is no necrotic tissue within the wound bed. The periwound skin appearance exhibited: Scarring, Maceration. The periwound skin appearance did not exhibit: Callus, Crepitus, Excoriation, Induration, Rash, Dry/Scaly, Atrophie Blanche, Cyanosis, Ecchymosis, Hemosiderin Staining, Mottled, Pallor, Rubor, Erythema. Periwound temperature was  noted as No Abnormality. 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 Wound #16 Right,Plantar Toe Great: Clean wound with Normal Saline. Wound #17 Right,Distal,Plantar Metatarsal head second: Clean wound with Normal Saline. Wound #18 Right,Dorsal Toe Great: Clean wound with Normal Saline. Wound #2 Right Toe Second: Clean wound with Normal Saline. Anesthetic (add to Medication List): Wound #14 Left Toe Second: Topical Lidocaine 4% cream applied to wound bed prior to debridement (In Clinic Only). Wound #16 Right,Plantar Toe  Great: Topical Lidocaine 4% cream applied to wound bed prior to debridement (In Clinic Only). Wound #17 Right,Distal,Plantar Metatarsal head second: Posten, Devonne E. ([id number]) Topical Lidocaine 4% cream applied to wound bed prior to debridement (In Clinic Only). Wound #18 Right,Dorsal Toe Great: Topical Lidocaine 4% cream applied to wound bed prior to debridement (In Clinic Only). Wound #2 Right Toe Second: Topical Lidocaine 4% cream applied to wound bed prior to debridement (In Clinic Only). Skin Barriers/Peri-Wound Care: Wound #14 Left Toe Second: Antifungal powder-Nystatin Wound #16 Right,Plantar Toe Great: Antifungal powder-Nystatin Wound #17 Right,Distal,Plantar Metatarsal head second: Antifungal powder-Nystatin Wound #18 Right,Dorsal Toe Great: Antifungal  powder-Nystatin Wound #2 Right Toe Second: Antifungal powder-Nystatin Primary Wound Dressing: Wound #14 Left Toe Second: Silver Alginate - secure with tape Wound #16 Right,Plantar Toe Great: Silver Alginate - secure with tape Wound #17 Right,Distal,Plantar Metatarsal head second: Silver Alginate - secure with tape Wound #18 Right,Dorsal Toe Great: Silver Alginate - secure with tape Wound #2 Right Toe Second: Silver Alginate - secure with tape Dressing Change Frequency: Wound #14 Left Toe Second: Change dressing every day. Wound #16 Right,Plantar Toe Great: Change dressing every day. Wound #17 Right,Distal,Plantar Metatarsal head second: Change dressing every day. Wound #18 Right,Dorsal Toe Great: Change dressing every day. Wound #2 Right Toe Second: Change dressing every day. Follow-up Appointments: Wound #14 Left Toe Second: Return Appointment in 2 weeks. Wound #16 Right,Plantar Toe Great: Return Appointment in 2 weeks. Wound #17 Right,Distal,Plantar Metatarsal head second: Return Appointment in 2 weeks. Wound #18 Right,Dorsal Toe Great: Return Appointment in 2 weeks. Wound #2 Right Toe Second: Return Appointment in 2 weeks. Edema Control: Wound #14 Left Toe Second: Patient to wear own compression stockings Wound #16 Right,Plantar Toe Great: Patient to wear own compression stockings Wound #17 Right,Distal,Plantar Metatarsal head second: Patient to wear own compression stockings Wound #18 Right,Dorsal Toe Great: Patient to wear own compression stockings Wound #2 Right Toe Second: Patient to wear own compression stockings Dineen, Darlene E. ([id number]) Additional Orders / Instructions: Wound #14 Left Toe Second: Stop Smoking Increase protein intake. Wound #16 Right,Plantar Toe Great: Stop Smoking Increase protein intake. Wound #17 Right,Distal,Plantar Metatarsal head second: Stop Smoking Increase protein intake. Wound #18 Right,Dorsal Toe Great: Stop  Smoking Increase protein intake. Wound #2 Right Toe Second: Stop Smoking Increase protein intake. Medications-please add to medication list.: Wound #14 Left Toe Second: Other: - Nystatin Powder Wound #16 Right,Plantar Toe Great: Other: - Nystatin Powder Wound #17 Right,Distal,Plantar Metatarsal head second: Other: - Nystatin Powder Wound #18 Right,Dorsal Toe Great: Other: - Nystatin Powder Wound #2 Right Toe Second: Other: - Nystatin Powder My suggestion at this point is gonna be that we go ahead and continue with the above wound care measures for the next week and the patient is in agreement with plan. If anything changes worsens will let me know. Please see above for specific wound care orders. We will see patient for re-evaluation in 2 week(s) here in the clinic. If anything worsens or changes patient will contact our office for additional recommendations. Electronic Signature(s) Signed: 07/04/2018 8:34:45 AM By: Worthy Keeler PA-C Entered By: Worthy Keeler on 07/03/2018 09:11:21 Politte, [name] ([id number]) -------------------------------------------------------------------------------- ROS/PFSH Details Patient Name: Garms, [name] Date of Service: 07/03/2018 8:30 AM Medical Record Number: [id number] Patient Account Number: [id number] Date of Birth/Sex: 1950/05/20 (68 y.o. M) Treating RN: Army Melia Primary Care Provider: Lamonte Sakai Other Clinician: Referring Provider: Lamonte Sakai Treating Provider/Extender: Melburn Hake, Demeisha Geraghty Weeks in Treatment: 16 Information Obtained From  Patient Wound History Do you currently have one or more open woundso Yes How many open wounds do you currently haveo 6 Approximately how long have you had your woundso 3 months How have you been treating your wound(s) until nowo ointment and bandage Has your wound(s) ever healed and then re-openedo No Have you had any lab work done in the past montho No Have you tested positive for an antibiotic  resistant organism (MRSA, VRE)o No Have you tested positive for osteomyelitis (bone infection)o No Have you had any tests for circulation on your legso Yes Who ordered the testo PCP Where was the test doneo [facility] Constitutional Symptoms (General Health) Complaints and Symptoms: Negative for: Fever; Chills Eyes Medical History: Negative for: Cataracts; Glaucoma; Optic Neuritis Ear/Nose/Mouth/Throat Medical History: Negative for: Chronic sinus problems/congestion; Middle ear problems Hematologic/Lymphatic Medical History: Positive for: Anemia; Lymphedema Negative for: Hemophilia; Human Immunodeficiency Virus; Sickle Cell Disease Respiratory Complaints and Symptoms: No Complaints or Symptoms Medical History: Negative for: Aspiration; Asthma; Chronic Obstructive Pulmonary Disease (COPD); Pneumothorax; Sleep Apnea; Tuberculosis Cardiovascular Complaints and Symptoms: No Complaints or Symptoms Amey, Ahsan E. ([id number]) Medical History: Positive for: Congestive Heart Failure; Hypertension; Peripheral Venous Disease Negative for: Angina; Arrhythmia; Coronary Artery Disease; Deep Vein Thrombosis; Hypotension; Myocardial Infarction; Peripheral Arterial Disease; Phlebitis; Vasculitis Gastrointestinal Medical History: Negative for: Cirrhosis ; Colitis; Crohnos; Hepatitis A; Hepatitis B; Hepatitis C Endocrine Medical History: Positive for: Type II Diabetes Treated with: Oral agents Blood sugar tested every day: Yes Tested : QD Genitourinary Medical History: Negative for: End Stage Renal Disease Immunological Medical History: Negative for: Lupus Erythematosus; Raynaudos; Scleroderma Integumentary (Skin) Medical History: Negative for: History of Burn; History of pressure wounds Musculoskeletal Medical History: Negative for: Gout; Rheumatoid Arthritis; Osteoarthritis; Osteomyelitis Neurologic Medical History: Positive for: Neuropathy Negative for: Dementia; Quadriplegia;  Paraplegia; Seizure Disorder Oncologic Medical History: Past Medical History Notes: 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 ([id number]) Immunization Notes: up to date Implantable Devices No devices added Family and Social History Current every day smoker; Marital Status - Widowed; Alcohol Use: Daily - quit drinking about a week ago; Drug Use: No History; Caffeine Use: Moderate; Financial Concerns: No; Food, Clothing or Shelter Needs: No; Support System Lacking: No; Transportation Concerns: No; Advanced Directives: No; Patient does not want information on Advanced Directives Physician Affirmation I have reviewed and agree with the above information. Electronic Signature(s) Signed: 07/03/2018 9:15:54 AM By: Army Melia Signed: 07/04/2018 8:34:45 AM By: Worthy Keeler PA-C Entered By: Worthy Keeler on 07/03/2018 09:10:11 Quizon, [name] ([id number]) -------------------------------------------------------------------------------- SuperBill Details Patient Name: Violett, [name] Date of Service: 07/03/2018 Medical Record Number: [id number] Patient Account Number: [id number] Date of Birth/Sex: [birth year] (68 y.o. M) Treating RN: Army Melia Primary Care Provider: Lamonte Sakai Other Clinician: Referring Provider: Lamonte Sakai Treating Provider/Extender: Melburn Hake, HOYT Weeks in Treatment: 76 Diagnosis Coding ICD-10 Codes Code Description L97.521 Non-pressure chronic ulcer of other part of left foot limited to breakdown of skin L97.511 Non-pressure chronic ulcer of other part of right foot limited to breakdown of skin E11.621 Type 2 diabetes mellitus with foot ulcer I87.323 Chronic venous hypertension (idiopathic) with inflammation of bilateral lower extremity I89.0 Lymphedema, not elsewhere  classified F17.218 Nicotine dependence, cigarettes, 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 abuse 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, right ankle and foot Physician Procedures CPT4 Code Description: [id number] - WC PHYS LEVEL  4 - EST PT ICD-10 Diagnosis Description L97.521 Non-pressure chronic ulcer of other part of left foot limited to L97.511 Non-pressure chronic ulcer of other part of right foot limited t E11.621 Type 2  diabetes mellitus with foot ulcer I87.323 Chronic venous hypertension (idiopathic) with inflammation of bi Modifier: breakdown of s o breakdown of lateral lower e Quantity: 1 kin skin xtremity Electronic Signature(s) Signed: 07/04/2018 8:34:45 AM By: Worthy Keeler PA-C Entered By: Worthy Keeler on 07/03/2018 09:11:47

## 2018-07-16 ENCOUNTER — Other Ambulatory Visit: Payer: Self-pay | Admitting: *Deleted

## 2018-07-16 DIAGNOSIS — D631 Anemia in chronic kidney disease: Secondary | ICD-10-CM

## 2018-07-16 DIAGNOSIS — N189 Chronic kidney disease, unspecified: Secondary | ICD-10-CM

## 2018-07-16 DIAGNOSIS — D509 Iron deficiency anemia, unspecified: Secondary | ICD-10-CM

## 2018-07-17 ENCOUNTER — Encounter: Payer: Medicare Other | Attending: Internal Medicine | Admitting: Internal Medicine

## 2018-07-17 ENCOUNTER — Other Ambulatory Visit: Payer: Self-pay

## 2018-07-17 DIAGNOSIS — E1140 Type 2 diabetes mellitus with diabetic neuropathy, unspecified: Secondary | ICD-10-CM | POA: Insufficient documentation

## 2018-07-17 DIAGNOSIS — Z85828 Personal history of other malignant neoplasm of skin: Secondary | ICD-10-CM | POA: Insufficient documentation

## 2018-07-17 DIAGNOSIS — N189 Chronic kidney disease, unspecified: Secondary | ICD-10-CM | POA: Diagnosis not present

## 2018-07-17 DIAGNOSIS — Z923 Personal history of irradiation: Secondary | ICD-10-CM | POA: Diagnosis not present

## 2018-07-17 DIAGNOSIS — I509 Heart failure, unspecified: Secondary | ICD-10-CM | POA: Diagnosis not present

## 2018-07-17 DIAGNOSIS — I890 Lymphedema, not elsewhere classified: Secondary | ICD-10-CM | POA: Diagnosis not present

## 2018-07-17 DIAGNOSIS — D631 Anemia in chronic kidney disease: Secondary | ICD-10-CM | POA: Diagnosis not present

## 2018-07-17 DIAGNOSIS — I87323 Chronic venous hypertension (idiopathic) with inflammation of bilateral lower extremity: Secondary | ICD-10-CM | POA: Diagnosis not present

## 2018-07-17 DIAGNOSIS — F17218 Nicotine dependence, cigarettes, with other nicotine-induced disorders: Secondary | ICD-10-CM | POA: Insufficient documentation

## 2018-07-17 DIAGNOSIS — I130 Hypertensive heart and chronic kidney disease with heart failure and stage 1 through stage 4 chronic kidney disease, or unspecified chronic kidney disease: Secondary | ICD-10-CM | POA: Diagnosis not present

## 2018-07-17 DIAGNOSIS — E1122 Type 2 diabetes mellitus with diabetic chronic kidney disease: Secondary | ICD-10-CM | POA: Insufficient documentation

## 2018-07-17 DIAGNOSIS — L97511 Non-pressure chronic ulcer of other part of right foot limited to breakdown of skin: Secondary | ICD-10-CM | POA: Diagnosis not present

## 2018-07-17 DIAGNOSIS — E1169 Type 2 diabetes mellitus with other specified complication: Secondary | ICD-10-CM | POA: Insufficient documentation

## 2018-07-17 DIAGNOSIS — E11621 Type 2 diabetes mellitus with foot ulcer: Secondary | ICD-10-CM | POA: Insufficient documentation

## 2018-07-17 DIAGNOSIS — L97521 Non-pressure chronic ulcer of other part of left foot limited to breakdown of skin: Secondary | ICD-10-CM | POA: Diagnosis not present

## 2018-07-17 NOTE — Progress Notes (Signed)
RICKI, CLACK ([id number]) Visit Report for 07/17/2018 Debridement Details Patient Name: [name], [name] Date of Service: 07/17/2018 2:15 PM Medical Record Number: [id number] Patient Account Number: [id number] Date of Birth/Sex: 1950-08-01 (68 y.o. M) Treating RN: Cornell Barman Primary Care Provider: Lamonte Sakai Other Clinician: Referring Provider: Lamonte Sakai Treating Provider/Extender: Tito Dine in Treatment: 94 Debridement Performed for Wound #16 Right,Plantar Toe Great Assessment: Performed By: Physician Ricard Dillon, MD Debridement Type: Debridement Severity of Tissue Pre Fat layer exposed Debridement: Level of Consciousness (Pre- Awake and Alert procedure): Pre-procedure Verification/Time Yes - 15:17 Out Taken: Start Time: 15:17 Total Area Debrided (L x W): 0.9 (cm) x 0.2 (cm) = 0.18 (cm) Tissue and other material Non-Viable, Skin: Dermis debrided: Level: Skin/Dermis Debridement Description: Selective/Open Wound Instrument: Curette Bleeding: None Response to Treatment: Procedure was tolerated well Level of Consciousness Awake and Alert (Post-procedure): Post Debridement Measurements of Total Wound Length: (cm) 0.9 Width: (cm) 0.8 Depth: (cm) 0.1 Volume: (cm) 0.057 Character of Wound/Ulcer Post Debridement: Stable Severity of Tissue Post Debridement: Fat layer exposed Post Procedure Diagnosis Same as Pre-procedure Electronic Signature(s) Signed: 07/17/2018 6:06:34 PM By: Linton Ham MD Signed: 07/17/2018 7:10:06 PM By: Gretta Cool, BSN, RN, CWS, Kim RN, BSN Entered By: Linton Ham on 07/17/2018 16:35:45 Bieler, [name] ([id number]) -------------------------------------------------------------------------------- HPI Details Patient Name: [name], [name] Date of Service: 07/17/2018 2:15 PM Medical Record Number: [id number] Patient Account Number: [id number] Date of Birth/Sex: 07/10/1950 (68 y.o. M) Treating RN: Cornell Barman Primary Care Provider:  Lamonte Sakai Other Clinician: Referring Provider: Lamonte Sakai Treating Provider/Extender: Tito Dine in Treatment: 52 History of Present Illness HPI Description: 68 year old patient here to see as for bilateral feet ulceration to on his left first and second toe and 2 on his right first and second toe, which she's had for about 4 months. 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 Donella Stade. The patient is also undergoing workup by medical oncology for a intra-abdominal lymphadenopathy. 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 He is also status post appendectomy, inguinal lymph node biopsy, rectal biopsy and rectal examination under anesthesia. he currently smokes cigarettes about half packet a day. In March of this year he was seen by Dr. Hortencia Pilar, for evaluation of bilateral varicose veins and besides wearing compression stockings he had recommended laser ablation of the right and left great saphenous veins to eleviate the symptoms and complications of severe superficial venous reflux disease. He also recommended lymphedema pumps for better control of his lymphedema. The patient recently has had on 08/23/2016, right greater saphenous vein ablation with the laser energy Earlier lower extremity venous reflux examination done on 05/08/2016 showed no DVT or SVT both lower legs but incompetence of bilateral great saphenous veins was present. A lower arterial study was also done and there was no significant right lower and left lower extremity problems based on a normal toe brachial index bilaterally and the ABI was 1.21 the left and 1.23 on the right. His post ablation venous duplex examination showed successful ablation of the  right GS vein with thrombus formation 2 below the right saphenofemoral junction. The deep system was patent without evidence of thrombosis and this was done on 08/30/2016. the patient also has a squamous cell cancer of the  skin of the buttock and is recently undergone radiation therapy for this prior to excisional surgery. Addendum: regarding his x-rays done today and x-ray of the left foot -- IMPRESSION: No objective evidence of osteomyelitis. There are soft tissue changes which may reflect cellulitis. X-ray of the right foot -- IMPRESSION:Findings compatible with cellulitis of the toes. No objective evidence of osteomyelitis is observed. 10/01/16 on evaluation today patient's wounds appeared to be doing some better. I did review the x-rays as well which showed no evidence of osteomyelitis although there was evidence on x-ray of cellulitis. He fortunately is not having any discomfort although he continues to have some swelling. He does not remember being on any antibiotics recently. 10/15/16 on evaluation today patient's wounds overall appear to be doing better although he does have a new location noted on the left foot. Fortunately he is not having significant pain. It almost has the appearance that something is rubbing on the end of his toes but he wears the open toe shoes and according to what he is telling me never wears anything that would rub on his foot. There is no evidence of infection and specifically no evidence of a fungal infection 10/22/16 On evaluation today patient's wounds appeared to be doing better compared to last week in regard to his bilateral lower extremities. Fortunately I happy with how things are progressing although he still has ulcers I feel like that he is improving and appropriate manner. 11/12/16 on evaluation today patient appears to be doing well in regard to his bilateral feet and the respective wounds. We have been using surrounding her dressings along with an  antifungal cream which seems to be doing very well. He has no bilateral dysfunction noticed that the rituals are weight loss at this point. He also has no nausea or vomiting a note purulent discharge. He did see Vein and vascular today and he tells me that they told him he could have surgery for his venous stasis but they did not feel like it was worth it in his words. Fortunately patient's wounds do appear to be getting sneakily better. VITO, BEG ([id number]) 11/26/2016 -- he says he is going to have some surgery during this week at [facility] for possibly a colon resection. 12/31/2016 -- the patient has been noncompliant with his smoking and I'm not sure whether he is also started drinking again. He continues to be very nonchalant about his care 01/14/2017 -- the patient's HandP has been reviewed well and I understand he is being compliant with trying to give up smoking and his local dressing changes. He does not have any surgical options of 4 to him by his vascular surgeons.he was last seen in early August by Dr. Hortencia Pilar who recommended compression stockings,and possibly lymph pumps in 2-3 months after doing a review ultrasound. 01/28/2017 - the patient did not have any fresh complaints but on examination I noted a large lacerated wound on the plantar aspect of his right fourth toe which had a lot of necrotic debris and it probes down to bone. 02/07/2017 -- x-ray of the right foot -- IMPRESSION: Soft tissue swelling about the first through fourth toes consistent with cellulitis. New destructive change in the tuft of the distal phalanx of the great toe is consistent with osteomyelitis. 02/14/2017 -- the patient's MRI is pending this coming Monday and he still continues to smoke. We have again gone over off loading of his wounds in great detail and he says he's been compliant. 02/21/2017 --  MR of the right foot -- IMPRESSION: 1. Soft tissue ulcer at the tip of the first, second and  third toe knows. Cortical irregularity and bone marrow edema in the first distal phalanx most concerning for osteomyelitis. Mild marrow edema in the second and third distal phalanx without definite cortical destruction which may reflect early osteomyelitis versus reactive marrow edema. 2. Soft tissue edema surrounding the first phalanx most consistent with cellulitis. the patient was also recently evaluated by his medical oncologist Dr. 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 She is treating him with weekly Procrit. She is also keeping intra-abdominal lymphadenopathy and right lower lobe lung nodule under observation. 04/04/2017 -- he was seen by Dr. Adrian Prows on 03/25/2017 -- after review he empirically put him on ciprofloxacin and doxycycline as they have good bone penetration and good bioavailability and it will cover the usual pathogens and diabetic foot osteomyelitis. He will check inflammatory markers and plan a 29-70 week old records. C-reactive protein was 0.3 and the ESR was 72 04/18/17 on evaluation today patient appears to be doing about the same in regard to his lower extremity wounds bilaterally. He has continued to use the antifungal cream which does seem to be beneficial. Nonetheless the ulcers do seem to in some areas be epithelial eyes over and in other areas are still open. He is having no significant discomfort. 04/25/17-he is here in follow-up evaluation for multiple ulcerations to multiple toes bilaterally. He states he did see Dr. Ola Spurr again last week and continues antibiotic therapy. He is voicing no complaints or concerns, will continue with current treatment plan will possibility of adding compression therapy next week after an additional week of treatment/lotions to BLE prescribed by Dr Ola Spurr 05/02/17 he is here in follow up for for multiple ulcers to multiple toes bilaterally. we will  stop using antifungal cream and will continue with silvercel and follow up next week 05/09/17-he is here in follow-up for multiple ulcerations to multiple toes bilaterally. There is improvement in appearance. He has not completely stopped using antifungal cream, but admits he has not using it between the toes. He has an appointment with Dr. Ola Spurr on 2/11, continues on doxycycline and Cipro. It has been 5 weeks of antibiotic therapy, we will order plain film xray to evaluate for osteomyelitis next week, prior to follow up with ID. Will continue with silvercel and follow up next week 05/16/17-he is here in follow-up evaluation for multiple ulcerations to multiple toes bilaterally and new wound to the right posterior heel. There is essentially no change in appearance, deteriorating measurements; he has a history of waxing and waning measurements. He admits that he continues to apply moisturizer/cream/ointment to his toes despite weekly reminders to only apply silvercel to his toes. He states that he thinks the surgical shoe contributed to the superficial ulcer to his posterior heel, he is unable to articulate if this was an area of dry cracked skin as he has a similar area to the left heel. He now is wearing open toed slippers. He has an appointment with Dr. Ola Spurr on 2/11. We have ordered x-rays for her bilateral feet; he was advised to obtain the x-rays today or tomorrow. He will follow-up next week 05/23/17-he is here in follow-up evaluation for multiple ulcerations to multiple toes bilaterally and the right posterior heel. There is improvement in maceration. He has been compliant and not applying any moisturizing agent to his toes. He  has been using Lac-Hydrin for his lower extremities with improvement. He did not go to his appointment on Monday with Dr. Ola Spurr secondary to financial concerns. X-rays for her bilateral feet showed: LEFT FOOT with slight erosion of the tuft of the  distal phalanges of the left first and second toe suspicious for osteomyelitis, RIGHT FOOT with 1.erosion of the tufts of the distal Sandridge, Briscoe E. ([id number]) phalanges of the right first second and possibly third toes consistent with osteomyelitis, 2 no definitive abnormality of the calcaneus is seen on the images obtained, 3. Plantar calcaneal degenerative spur. We briefly discussed hyperbaric adjunctive therapy for treatment of chronic refractory osteomyelitis. I do not find an a1c in EMR, will contact PCP for record, or order if needed. He has been encouraged to contact Dr Ola Spurr office regarding the follow-up appointment, encouraged him to inquire about payment plan. We will continue with same treatment plan and follow-up next week. He states he is still taking antibiotics and has "a lot" left. He states he has been taking them as directed, 2 pills twice daily. According to Dr. Blane Ohara office notes he was originally started on 12/17 for 4 weeks and extended on 1/14 for an additional 4 weeks. He should be done with his antibiotic therapy, he was advised to bring his bottles and to his next appointment, we will contact pharmacy. 05/30/17-he is here in follow-up evaluation for multiple ulcerations to multiple toes bilaterally and the right posterior heel. He is accompanied by his brother-in-law. Wounds are stable. He has yet to make up with Dr. Ola Spurr. We contacted his PCP, with no record of recent A1c we will draw an A1c. His brother-in-law states that he was taken off all of his diabetic medication secondary to kidney function. 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 The antibiotics were counted and have approximately 2 weeks left, although they should be complete. The pill organizer reveals missing days. We discussed the need for consistent medications, to have optimal benefit of  medication. He has a cousin that lives with him and he will ask her to check his organizer daily. He has been advised to follow up with Dr Ola Spurr, and will go by the office today. He has been advised to quit smoking. 06/06/17-he is here in follow up evaluation. He has had to make an appointment with Dr. Ola Spurr. He did have blood work obtained, a1c 5. He continues to take antibiotic therapy. Significant improvement in bilateral lower extremity edema with compression therapy. Essentially no change in ulcerations to toes. He states he is "going to try something different" and "let me know next week" if it works; he would not provide any additional information and was encouraged to follow our orders. We will follow up next week 06/13/17-he is here in follow-up evaluation. He has an appointment with Dr. Ola Spurr tomorrow morning. He states he purchased an ointment from [facility] and applied to his toes for 3 days, he does not remember the name of the ointment. There is improvement to his wounds, minimal maceration. He continues to take antibiotic therapy, this should have been completed last month. His brother-in-law who regularly accompanies his appointments was asked to take the bottles to the appointment tomorrow with Dr. Ola Spurr so he is aware. We will continue with 3 layer compression, and order OPEN TOE compression 20-30mmHg; we will apply compression stockings next week. He continues to smoke, smoked "2 cigarettes" last week 06/20/17  on evaluation today patient did receive his compression stockings which he has with him today for both lower extremities. With that being said he tells me at this point in time that he is very happy to have these he really is not a big fan of the compression wraps that we have been utilizing although they have been of great benefit for him. Nonetheless at this point he does want to switch to the compression stockings. In my opinion as long as he is continuing  with compression I'm okay with the stockings or the wraps. 06/27/17-he is here in follow-up evaluation for multiple ulcerations to his bilateral toes. There is some improvement in appearance. He is compliant in wearing his compression stockings with significant improvement in lower extremity edema. He saw Dr Ola Spurr on 3/8, per his notes they would redraw ESR and CRP; plan to continue antibiotic therapy if these remain elevated. I do not see an ESR or CRP level in Epic. The patient continues to take antibiotics. 07/11/17-He is here in follow-up evaluation for multiple ulcerations to multiple toes bilaterally. He presents with complete epithelialization to the right third toe; there has been no deterioration. He continues on antibiotic therapy. He will follow-up next week 07/18/17-He is here in follow-up evaluation for multiple ulcerations to multiple toes bilaterally. He continues to make improvement. He continues on antibiotic therapy. He states he has been using something additional to our orders, he does not elaborate but states he will bring it in next week. 07/25/17-He is here in follow up evaluation for multiple ulcerations to bilateral toes. He is stable. He has completed antibiotic therapy. He admits to "filing" his toes after showers each evening, this is what he was referencing last week; he does not filing for the wounds. We will switch to [facility] and monitor for any improvement, he will follow-up next week 08/01/17-He is here in follow-up evaluation. He admits to "picking" at his toes after cleansing yesterday, leading to new areas of tissue loss on the bilateral second toe. There is improvement noted to the bilateral great toe. We will dress toes today and hope that they maintain until Monday where he will come in for a nurse visit. He has been advised, multiple times with expressed verbalization, to change the dressings to silvercel if the dressings get wet prior to [facility]  appointment. 08/08/17-He is here in follow-up evaluation for bilateral first and second toe ulcerations. There is significant improvement to all ulcerations since last visit. We will switch to [facility] to all wounds and he will continue with nurse visits on a Monday/Thursday schedule and follow-up with me in 2 weeks. He continues to smoke, 1-3 cigarettes per day, and has been encouraged to not smoke until his next follow-up in 2 weeks. 08/15/17 on evaluation today patient's ulcers on his toes actually appear to be doing fairly well the right to ulcers may be a little bit more moist compared to the left I'm not really sure exactly why as the openings appear to be very small and I'm not seeing any evidence of anything significant as far as you lightest or otherwise. Nonetheless this may just be a small setback he's Zaragoza, [name] ([id number]) been doing very well with the [facility] Dressing 08/22/17-He is here in follow-up evaluation for ulcerations to his bilateral first and second toes, there is small/scant amount of drainage noted on today's dressing. He continues with Hydrofera Blue. He continues to smoke, 1 cigarette a day. Voices no complaint or concerns,  compliant and compression therapy today. He'll follow-up next week 08/29/17-He is here in follow-up evaluation for ulcerations to his bilateral first and second toes, with a new wound to the left third toe. He states he cut himself while cutting the toenail. He presents today with more maceration and increased measurements; waxing and waning measurements and moisture has been an ongoing issue. He continues to smoke a proximally 1 cigarette per day, but states he has not smoked since Sunday. We will initiate medihoney daily and evaluate next week. He presents today without compression stockings 09/05/17-He is here in follow-up evaluation for ulcerations to bilateral first and second and left third toe. He did not pick up the medihoney and  therefore has not been changing his dressing. He admits to inconsistent where of compression stockings, admits to pain to his bilateral lower extremities with unilateral edema (right greater than left). The ulcerations to multiple toes are more macerated and larger in size than last week. He is wearing compression therapy today. He admits to an overall feeling of weakness and fatigue and has been encouraged to contact his PCP for evaluation; he has a known history of anemia and intraabdominal lymphadenopathy. We will return to silvercel and he will follow up next week 09/19/17-He is here in follow-up evaluation for ulcerations to bilateral first and second toe.he was unable to make last week's appointment. The culture that was obtained on 5/30 grew clindamycin sensitive MRSA; he was initiated on 6/4 but did not start it until 6/7. There is significant improvement in all wounds, healing to left second and third toe. He also admits to having no alcohol or smoking for the last 6 days. We will continue with same treatment plan I will extend the clindamycin for an additional 10 days and he will follow-up next week. 09/26/17- He is here in follow evaluation for multiple ulcerations to multiple toes bilaterally. He continues to be non-compliant with dressing, compression therapy. He states he is taking the antibiotics as prescribed, although his recall is inconsistent. There is noted deterioration in all ulcers. We will continue same treatment plan, antibiotic therapy and he will follow up next week. 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 This information is obtained from the medical record, as he cannot articulate specifics to his diagnosis or treatment plan. 10/17/17-He is here in follow-up evaluation for multiple ulcerations to  multiple toes bilaterally. There is chronic waxing and waning improvement/deterioration. On today's exam they appear improved, dry without maceration. He continues to apply a variety of topical agents, stating that he has switched between the blue product and silver product. In my opinion, this will be a chronic waxing and waning giving his intermittent compliance. He will follow-up in 2 weeks. As it relates to the biopsy taken from his buttock, he has not followed up with that and has not started radiation therapy. 10/31/17-He is here for evaluation for multiple ulcerations to multiple toes bilaterally. Continues with waxing and waning improvement versus deterioration. They are stable on today's exam without significant maceration. 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 We will continue with every 2 week follow-up appointments 11/14/17- He is seen in follow-up evaluation for multiple ulcerations to multiple bilateral toes. He is scheduled for excision of basal cell carcinoma to his right buttock on Monday 8/12. He  continues to have chronic waxing and waning of his ulcers; today is a stable day with minimal drainage and left ear is better than right toes. I will recount to infectious disease regarding chronic suppressive therapy. He admits to being more compliant with compression stockings, although he is not wearing any today. He has been encouraged to continue with dressing changes as ordered and compression therapy daily. He will be seen in 2 weeks 11/28/17-He is seen in follow up evaluation for multiple ulcerations to multiple toes bilaterally. He had an overnight admission at [facility] (8/11-8/12) for symptomatic anemia, received two units prbc. His surgical date has been moved to 9/94 excision of basal cell carcinoma to the right buttock. He has been noncompliant in wearing compression stockings, applying topical treatment as ordered; he has been applying  a "cream", Hydrofera Blue, silvercel. He is currently on no antibiotic therapy; we will send for re-referral to ID for suppressive therapy given his multiple co-morbidities, poor surgical candidate, unwilling to have toe amputation and general non-adherence. 12/19/17 on evaluation today patient actually appears to be doing very well in regard to his toes at least compared to last time I saw him. He again has been coming to our office for quite a bit of time. 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 He states is hurting a little bit but doing well in general. I'm see were not taking care of this area dermatology is. He does have his appointment with infectious disease upcoming which they re-consult for suppressive therapy that's next Monday. 01/02/18 on evaluation today patient actually appears to be doing rather well in regard to his toe ulcers although he tells me he has been put in an ointment on this which I am concerned may be causing some additional moisture buildup which we really do not want. He supposed be using silver cell which is mainly thing I want him to be utilizing. Fortunately there does not appear to be any evidence of worsening infection which is good news. He has had surgery on his gluteal region where he had an excision of a cancerous area fortunately that seems to be doing well although he is having some discomfort. 02/06/18 on evaluation today patient actually appears to be doing well at all locations except for his second toe use bilaterally Tamura, [name] ([id number]) where the distal tip is still wrong open. He did state that he can't stand for dry skin to buildup on his toes subsequently as a question and further it sounds as if he actually pills this all which is likely keeping the toe is wrong and open. Also think that's what happened in the bottom of his foot  although that appears to have healed at this point. There does not appear to be any evidence of infection. 02/20/18 on evaluation today patient appears to be doing better in regard to his foot ulcers. Everything has shown signs of improvement which is excellent news. Overall I'm very pleased with how things appear currently. The patient states he's not picking her point at any skin and that he wears shoes at all times when he is ambulating at home. Both are things that I've advocated and recommended for him in the past. 03/13/18 on evaluation today patient actually appears to be doing very well in regard to the bilateral toes in the overall appearance of the wound bed locations. Fortunately there does  not appear to be any signs of infection at this time. He states that he is very pleased with how things seem to be progressing to be honest as am I. 03/27/1899 evaluation today patient appears to be doing well in regard to his toe ulcers. He just has a few areas remaining and these appear to be showing signs of improvement as well which is great news. Overall I have been impressed with the progress of the past several weeks. 04/10/18 on evaluation today patient appears to be doing a little bit worse in regard to the right plantar foot at the regional the first metatarsal as well as the left second toe. He tells me that he ran out of bandages he also tells me he's been wearing his flip-flops a lot at home as well as a new pair of boots that he received as well. This is as opposed to the postop shoes that we had previously given him. Nonetheless overall I feel like that he has had a little bit more friction to the area which is causing things to look a little bit worse today. 04/24/18 on evaluation today patient's wounds for the most part appear to be doing excellent especially in regard to the bilateral second toes. With that being said he does have issues with the wound still on the first metatarsal  region and first toe of the right foot which have not completely closed at this point. Nonetheless I do feel like that he is making progress which is good news. 05/08/18 on evaluation today patient's wounds appear to be doing just a little bit worse than normal simply due to the fact that he has not had dressing supplies. He apparently owed $35 in order for them to be shipped and unfortunately is not able to pay that currently. We did give him information for the [facility] today which will hopefully help cover his dressing supplies. He was thankful for this he states is gonna contact them this afternoon. Fortunately otherwise or does not hear any signs of infection. 05/22/18 on evaluation today patient appears to be doing somewhat poorly in regard to his toes. Upon further questioning it appears that he actually has been pulling skin off of the toes which "drive him crazy". Subsequently as he does this he calls his new areas to tear open and I think this is one of the reasons why his toes are not healing as appropriately as we would like. He fortunately has no signs of infection at this time. With that being said I'm still concerned about the possibility of a fungal infection even though there doesn't appear to be anything bacterial. Potentially nystatin powder could be of benefit for him as far as trying to help this in that regard. 06/05/18 on evaluation today patient actually appears to be doing very well in regard to his foot ulcers. Fortunately there's no signs of infection. He's been tolerating the dressing changes without complication. I'm very pleased with how things seem to be progressing. Fortunately there's no evidence of active infection at this point I do believe nystatin powder has been of benefit for him. 06/19/18 on evaluation today patient actually appears to be doing a little worse compared to last evaluation. Tells me that he has not had any supplies for two weeks.  With that being said his plantar foot wounds on the right actually appear to be doing very well. It's actually a second toes in the first toes that are the worst. I think  these are areas the was able to get to and pick and he tells me in fact that he destine to pick out some of the dry skin because it causes him annoyance. Nonetheless I think this is somewhat detrimental to him I think it's worth keeping part of the wound open in particular. Fortunately there is no sign of infection at this time although I think that is a risk which I discussed specifically that he's not careful you can end up reopening this and causing greater problems for himself. 07/03/18 on evaluation today patient's wound bed actually shows evidence of good granulation at this time and epithelialization as far as the wounds in general are concerned. He has been tolerating the dressing changes including the nystatin without complication. This is good news. With that being said he seems to be still having some trouble with the left second toe distally at this point. SHUAYB, SCHEPERS ([id number]) 4/9; most of the wounds look quite satisfactory. He requires debridement of the plantar right great toe otherwise everything looks superficial to improved. His left second toe is healed. Electronic Signature(s) Signed: 07/17/2018 6:06:34 PM By: Linton Ham MD Entered By: Linton Ham on 07/17/2018 17:15:35 Obarr, [name] ([id number]) -------------------------------------------------------------------------------- Physical Exam Details Patient Name: Baird, [name] Date of Service: 07/17/2018 2:15 PM Medical Record Number: [id number] Patient Account Number: [id number] Date of Birth/Sex: 09/02/1950 (68 y.o. M) Treating RN: Cornell Barman Primary Care Provider: Lamonte Sakai Other Clinician: Referring Provider: Lamonte Sakai Treating Provider/Extender: Tito Dine in Treatment: 59 Constitutional Patient is hypertensive..  Pulse regular and within target range for patient.Marland Kitchen Respirations regular, non-labored and within target range.. Temperature is normal and within the target range for the patient.Marland Kitchen appears in no distress. Notes Wound exam; oThe area on the right plantar great toe was debrided of skin and subcutaneous tissue. This cleans up quite nicely oThe rest of the wounds look stable to improved and are superficial. No debridement was required this includes the right second met head right first toe dorsal and the right second toe dorsal. The left second toe was healed Electronic Signature(s) Signed: 07/17/2018 6:06:34 PM By: Linton Ham MD Entered By: Linton Ham on 07/17/2018 17:17:13 Equihua, [name] ([id number]) -------------------------------------------------------------------------------- Physician Orders Details Patient Name: Minniear, [name] Date of Service: 07/17/2018 2:15 PM Medical Record Number: [id number] Patient Account Number: [id number] Date of Birth/Sex: 1951-01-31 (68 y.o. M) Treating RN: Cornell Barman Primary Care Provider: Lamonte Sakai Other Clinician: Referring Provider: Lamonte Sakai Treating Provider/Extender: Tito Dine in Treatment: 42 Verbal / Phone Orders: No Diagnosis Coding Wound Cleansing Wound #16 Right,Plantar Toe Great o Clean wound with Normal Saline. Wound #17 Right,Distal,Plantar Metatarsal head second o Clean wound with Normal Saline. Wound #18 Right,Dorsal Toe Great o Clean wound with Normal Saline. Wound #2 Right Toe Second o Clean wound with Normal Saline. Anesthetic (add to Medication List) Wound #16 Right,Plantar Toe Great o Topical Lidocaine 4% cream applied to wound bed prior to debridement (In Clinic Only). Wound #17 Right,Distal,Plantar Metatarsal head second o Topical Lidocaine 4% cream applied to wound bed prior to debridement (In Clinic Only). Wound #18 Right,Dorsal Toe Great o Topical Lidocaine 4% cream applied to wound  bed prior to debridement (In Clinic Only). Wound #2 Right Toe Second o Topical Lidocaine 4% cream applied to wound bed prior to debridement (In Clinic Only). Skin Barriers/Peri-Wound Care Wound #16 Right,Plantar Toe Great o Antifungal powder-Nystatin Wound #17 Right,Distal,Plantar Metatarsal head second o Antifungal powder-Nystatin Wound #18 Right,Dorsal Toe  Great o Antifungal powder-Nystatin Wound #2 Right Toe Second o Antifungal powder-Nystatin Primary Wound Dressing Wound #16 Right,Plantar Toe Great o Silver Alginate - secure with tape Wound #17 Right,Distal,Plantar Metatarsal head second Racca, [name] E. ([id number]) o Silver Alginate - secure with tape Wound #18 Right,Dorsal Toe Great o Silver Alginate - secure with tape Wound #2 Right Toe Second o Silver Alginate - secure with tape Dressing Change Frequency Wound #16 Right,Plantar Toe Great o Change dressing every day. Wound #17 Right,Distal,Plantar Metatarsal head second o Change dressing every day. Wound #18 Right,Dorsal Toe Great o Change dressing every day. Wound #2 Right Toe Second o Change dressing every day. Follow-up Appointments Wound #16 Right,Plantar Toe Great o Return Appointment in 2 weeks. Wound #17 Right,Distal,Plantar Metatarsal head second o Return Appointment in 2 weeks. Wound #18 Right,Dorsal Toe Great o Return Appointment in 2 weeks. Wound #2 Right Toe Second o Return Appointment in 2 weeks. Edema Control Wound #16 Right,Plantar Toe Great o Patient to wear own compression stockings Wound #17 Right,Distal,Plantar Metatarsal head second o Patient to wear own compression stockings Wound #18 Right,Dorsal Toe Great o Patient to wear own compression stockings Wound #2 Right Toe Second o Patient to wear own compression stockings Additional Orders / Instructions Wound #16 Right,Plantar Toe Great o Stop Smoking o Increase protein intake. Wound #17  Right,Distal,Plantar Metatarsal head second o Stop Smoking o Increase protein intake. HAIDEN, RAWLINSON ([id number]) Wound #18 Right,Dorsal Toe Great o Stop Smoking o Increase protein intake. Wound #2 Right Toe Second o Stop Smoking o Increase protein intake. Medications-please add to medication list. Wound #16 Right,Plantar Toe Great o Other: - Nystatin Powder Wound #17 Right,Distal,Plantar Metatarsal head second o Other: - Nystatin Powder Wound #18 Right,Dorsal Toe Great o Other: - Nystatin Powder Wound #2 Right Toe Second o Other: - Nystatin Powder Electronic Signature(s) Signed: 07/17/2018 6:06:34 PM By: Linton Ham MD Signed: 07/17/2018 7:10:06 PM By: Gretta Cool, BSN, RN, CWS, Kim RN, BSN Entered By: Gretta Cool, BSN, RN, CWS, Kim on 07/17/2018 15:19:02 Morden, [name] ([id number]) -------------------------------------------------------------------------------- Problem List Details Patient Name: Yokley, [name] Date of Service: 07/17/2018 2:15 PM Medical Record Number: [id number] Patient Account Number: [id number] Date of Birth/Sex: 09-01-1950 (68 y.o. M) Treating RN: Cornell Barman Primary Care Provider: Lamonte Sakai Other Clinician: Referring Provider: Lamonte Sakai Treating Provider/Extender: Tito Dine in Treatment: 27 Active Problems ICD-10 Evaluated Encounter Code Description Active Date Today Diagnosis L97.521 Non-pressure chronic ulcer of other part of left foot limited to 09/21/2016 No Yes breakdown of skin L97.511 Non-pressure chronic ulcer of other part of right foot limited to 09/21/2016 No Yes breakdown of skin E11.621 Type 2 diabetes mellitus with foot ulcer 09/21/2016 No Yes I87.323 Chronic venous hypertension (idiopathic) with inflammation of 09/21/2016 No Yes bilateral lower extremity I89.0 Lymphedema, not elsewhere classified 09/21/2016 No Yes F17.218 Nicotine dependence, cigarettes, with other nicotine-induced 09/21/2016 No  Yes disorders F10.19 Alcohol abuse with unspecified alcohol-induced disorder 09/21/2016 No Yes M86.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, right ankle and foot 02/21/2017 No Yes Inactive Problems Resolved Problems Ogan, NAYDEN CZAJKA ([id number]) Electronic Signature(s) Signed: 07/17/2018 6:06:34 PM By: Linton Ham MD Entered By: Linton Ham on 07/17/2018 16:12:01 Alton, [name] ([id number]) -------------------------------------------------------------------------------- Progress Note Details Patient Name: Kolasinski, [name] Date of Service: 07/17/2018 2:15 PM Medical Record Number: [id number] Patient Account Number: [id number] Date of Birth/Sex: [birth year] (68 y.o. M) Treating RN: Cornell Barman Primary Care Provider: Lamonte Sakai Other Clinician: Referring Provider: Lamonte Sakai Treating Provider/Extender: Tito Dine in Treatment: 76  Subjective History of Present Illness (HPI) 68 year old patient here to see as for bilateral feet ulceration to on his left first and second toe and 2 on his right first and second toe, which she's had for about 4 months. 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 Donella Stade. The patient is also undergoing workup by medical oncology for a intra-abdominal lymphadenopathy. 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 He is also status post appendectomy, inguinal lymph node biopsy, rectal biopsy and rectal examination under anesthesia. he currently smokes cigarettes about half packet a day. In March of this year he was seen by Dr. Hortencia Pilar, for evaluation of bilateral varicose veins and besides wearing compression stockings he had recommended laser ablation of the right and left great saphenous veins to  eleviate the symptoms and complications of severe superficial venous reflux disease. He also recommended lymphedema pumps for better control of his lymphedema. The patient recently has had on 08/23/2016, right greater saphenous vein ablation with the laser energy Earlier lower extremity venous reflux examination done on 05/08/2016 showed no DVT or SVT both lower legs but incompetence of bilateral great saphenous veins was present. A lower arterial study was also done and there was no significant right lower and left lower extremity problems based on a normal toe brachial index bilaterally and the ABI was 1.21 the left and 1.23 on the right. His post ablation venous duplex examination showed successful ablation of the right GS vein with thrombus formation 2 below the right saphenofemoral junction. The deep system was patent without evidence of thrombosis and this was done on 08/30/2016. 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 Addendum: regarding his x-rays done today and x-ray of the left foot -- IMPRESSION: No objective evidence of osteomyelitis. There are soft tissue changes which may reflect cellulitis. X-ray of the right foot -- IMPRESSION:Findings compatible with cellulitis of the toes. No objective evidence of osteomyelitis is observed. 10/01/16 on evaluation today patient's wounds appeared to be doing some better. I did review the x-rays as well which showed no evidence of osteomyelitis although there was evidence on x-ray of cellulitis. He fortunately is not having any discomfort although he continues to have some swelling. He does not remember being on any antibiotics recently. 10/15/16 on evaluation today patient's wounds overall appear to be doing better although he does have a new location noted on the left foot. Fortunately he is not having significant pain. It almost has the  appearance that something is rubbing on the end of his toes but he wears the open toe shoes and according to what he is telling me never wears anything that would rub on his foot. There is no evidence of infection and specifically no evidence of a fungal infection 10/22/16 On evaluation today patient's wounds appeared to be doing better compared to last week in regard to his bilateral lower extremities. Fortunately I happy with how things are progressing although he still has ulcers I feel like that he is improving and appropriate manner. 11/12/16 on evaluation today patient appears to be doing well in regard to his bilateral feet and the respective wounds. We have been using surrounding her  dressings along with an antifungal cream which seems to be doing very well. He has no bilateral dysfunction noticed that the rituals are weight loss at this point. He also has no nausea or vomiting a note purulent discharge. He did see Vein and vascular today and he tells me that they told him he could have surgery for his venous stasis but they did Grimmett, Tavious E. ([id number]) not feel like it was worth it in his words. Fortunately patient's wounds do appear to be getting sneakily better. 11/26/2016 -- he says he is going to have some surgery during this week at [facility] for possibly a colon resection. 12/31/2016 -- the patient has been noncompliant with his smoking and I'm not sure whether he is also started drinking again. He continues to be very nonchalant about his care 01/14/2017 -- the patient's HandP has been reviewed well and I understand he is being compliant with trying to give up smoking and his local dressing changes. He does not have any surgical options of 4 to him by his vascular surgeons.he was last seen in early August by Dr. Hortencia Pilar who recommended compression stockings,and possibly lymph pumps in 2-3 months after doing a review ultrasound. 01/28/2017 - the patient did not have any  fresh complaints but on examination I noted a large lacerated wound on the plantar aspect of his right fourth toe which had a lot of necrotic debris and it probes down to bone. 02/07/2017 -- x-ray of the right foot -- IMPRESSION: Soft tissue swelling about the first through fourth toes consistent with cellulitis. New destructive change in the tuft of the distal phalanx of the great toe is consistent with osteomyelitis. 02/14/2017 -- the patient's MRI is pending this coming Monday and he still continues to smoke. We have again gone over off loading of his wounds in great detail and he says he's been compliant. 02/21/2017 -- MR of the right foot -- IMPRESSION: 1. Soft tissue ulcer at the tip of the first, second and third toe knows. Cortical irregularity and bone marrow edema in the first distal phalanx most concerning for osteomyelitis. Mild marrow edema in the second and third distal phalanx without definite cortical destruction which may reflect early osteomyelitis versus reactive marrow edema. 2. Soft tissue edema surrounding the first phalanx most consistent with cellulitis. the patient was also recently evaluated by his medical oncologist Dr. 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 She is treating him with weekly Procrit. She is also keeping intra-abdominal lymphadenopathy and right lower lobe lung nodule under observation. 04/04/2017 -- he was seen by Dr. Adrian Prows on 03/25/2017 -- after review he empirically put him on ciprofloxacin and doxycycline as they have good bone penetration and good bioavailability and it will cover the usual pathogens and diabetic foot osteomyelitis. He will check inflammatory markers and plan a 73-48 week old records. C-reactive protein was 0.3 and the ESR was 72 04/18/17 on evaluation today patient appears to be doing about the same in regard to his lower extremity wounds  bilaterally. He has continued to use the antifungal cream which does seem to be beneficial. Nonetheless the ulcers do seem to in some areas be epithelial eyes over and in other areas are still open. He is having no significant discomfort. 04/25/17-he is here in follow-up evaluation for multiple ulcerations to multiple toes bilaterally. He states he did see Dr. Ola Spurr again last week and continues antibiotic therapy.  He is voicing no complaints or concerns, will continue with current treatment plan will possibility of adding compression therapy next week after an additional week of treatment/lotions to BLE prescribed by Dr Ola Spurr 05/02/17 he is here in follow up for for multiple ulcers to multiple toes bilaterally. we will stop using antifungal cream and will continue with silvercel and follow up next week 05/09/17-he is here in follow-up for multiple ulcerations to multiple toes bilaterally. There is improvement in appearance. He has not completely stopped using antifungal cream, but admits he has not using it between the toes. He has an appointment with Dr. Ola Spurr on 2/11, continues on doxycycline and Cipro. It has been 5 weeks of antibiotic therapy, we will order plain film xray to evaluate for osteomyelitis next week, prior to follow up with ID. Will continue with silvercel and follow up next week 05/16/17-he is here in follow-up evaluation for multiple ulcerations to multiple toes bilaterally and new wound to the right posterior heel. There is essentially no change in appearance, deteriorating measurements; he has a history of waxing and waning measurements. He admits that he continues to apply moisturizer/cream/ointment to his toes despite weekly reminders to only apply silvercel to his toes. He states that he thinks the surgical shoe contributed to the superficial ulcer to his posterior heel, he is unable to articulate if this was an area of dry cracked skin as he has a similar area to  the left heel. He now is wearing open toed slippers. He has an appointment with Dr. Ola Spurr on 2/11. We have ordered x-rays for her bilateral feet; he was advised to obtain the x-rays today or tomorrow. He will follow-up next week 05/23/17-he is here in follow-up evaluation for multiple ulcerations to multiple toes bilaterally and the right posterior heel. There is improvement in maceration. He has been compliant and not applying any moisturizing agent to his toes. He has been using Lac-Hydrin for his lower extremities with improvement. He did not go to his appointment on Monday with Dr. Ola Spurr secondary to financial concerns. X-rays for her bilateral feet showed: LEFT FOOT with slight erosion of the tuft of the distal Rotert, Ryden E. ([id number]) phalanges of the left first and second toe suspicious for osteomyelitis, RIGHT FOOT with 1.erosion of the tufts of the distal phalanges of the right first second and possibly third toes consistent with osteomyelitis, 2 no definitive abnormality of the calcaneus is seen on the images obtained, 3. Plantar calcaneal degenerative spur. We briefly discussed hyperbaric adjunctive therapy for treatment of chronic refractory osteomyelitis. I do not find an a1c in EMR, will contact PCP for record, or order if needed. He has been encouraged to contact Dr Ola Spurr office regarding the follow-up appointment, encouraged him to inquire about payment plan. We will continue with same treatment plan and follow-up next week. He states he is still taking antibiotics and has "a lot" left. He states he has been taking them as directed, 2 pills twice daily. According to Dr. Blane Ohara office notes he was originally started on 12/17 for 4 weeks and extended on 1/14 for an additional 4 weeks. He should be done with his antibiotic therapy, he was advised to bring his bottles and to his next appointment, we will contact pharmacy. 05/30/17-he is here in follow-up evaluation  for multiple ulcerations to multiple toes bilaterally and the right posterior heel. He is accompanied by his brother-in-law. Wounds are stable. He has yet to make up with Dr. Ola Spurr. We contacted his PCP, with  no record of recent A1c we will draw an A1c. His brother-in-law states that he was taken off all of his diabetic medication secondary to kidney function. 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 The antibiotics were counted and have approximately 2 weeks left, although they should be complete. The pill organizer reveals missing days. We discussed the need for consistent medications, to have optimal benefit of medication. He has a cousin that lives with him and he will ask her to check his organizer daily. He has been advised to follow up with Dr Ola Spurr, and will go by the office today. He has been advised to quit smoking. 06/06/17-he is here in follow up evaluation. He has had to make an appointment with Dr. Ola Spurr. He did have blood work obtained, a1c 5. He continues to take antibiotic therapy. Significant improvement in bilateral lower extremity edema with compression therapy. Essentially no change in ulcerations to toes. He states he is "going to try something different" and "let me know next week" if it works; he would not provide any additional information and was encouraged to follow our orders. We will follow up next week 06/13/17-he is here in follow-up evaluation. He has an appointment with Dr. Ola Spurr tomorrow morning. He states he purchased an ointment from [facility] and applied to his toes for 3 days, he does not remember the name of the ointment. There is improvement to his wounds, minimal maceration. He continues to take antibiotic therapy, this should have been completed last month. His brother-in-law who regularly accompanies his appointments was asked to take the  bottles to the appointment tomorrow with Dr. Ola Spurr so he is aware. We will continue with 3 layer compression, and order OPEN TOE compression 20-30mmHg; we will apply compression stockings next week. He continues to smoke, smoked "2 cigarettes" last week 06/20/17 on evaluation today patient did receive his compression stockings which he has with him today for both lower extremities. With that being said he tells me at this point in time that he is very happy to have these he really is not a big fan of the compression wraps that we have been utilizing although they have been of great benefit for him. Nonetheless at this point he does want to switch to the compression stockings. In my opinion as long as he is continuing with compression I'm okay with the stockings or the wraps. 06/27/17-he is here in follow-up evaluation for multiple ulcerations to his bilateral toes. There is some improvement in appearance. He is compliant in wearing his compression stockings with significant improvement in lower extremity edema. He saw Dr Ola Spurr on 3/8, per his notes they would redraw ESR and CRP; plan to continue antibiotic therapy if these remain elevated. I do not see an ESR or CRP level in Epic. The patient continues to take antibiotics. 07/11/17-He is here in follow-up evaluation for multiple ulcerations to multiple toes bilaterally. He presents with complete epithelialization to the right third toe; there has been no deterioration. He continues on antibiotic therapy. He will follow-up next week 07/18/17-He is here in follow-up evaluation for multiple ulcerations to multiple toes bilaterally. He continues to make improvement. He continues on antibiotic therapy. He states he has been using something additional to our orders, he does not elaborate but states he will bring it in next week. 07/25/17-He is here in follow up evaluation for multiple ulcerations to bilateral toes. He is stable. He has  completed  antibiotic therapy. He admits to "filing" his toes after showers each evening, this is what he was referencing last week; he does not filing for the wounds. We will switch to [facility] and monitor for any improvement, he will follow-up next week 08/01/17-He is here in follow-up evaluation. He admits to "picking" at his toes after cleansing yesterday, leading to new areas of tissue loss on the bilateral second toe. There is improvement noted to the bilateral great toe. We will dress toes today and hope that they maintain until Monday where he will come in for a nurse visit. He has been advised, multiple times with expressed verbalization, to change the dressings to silvercel if the dressings get wet prior to [facility] appointment. 08/08/17-He is here in follow-up evaluation for bilateral first and second toe ulcerations. There is significant improvement to all ulcerations since last visit. We will switch to [facility] to all wounds and he will continue with nurse visits on a Monday/Thursday schedule and follow-up with me in 2 weeks. He continues to smoke, 1-3 cigarettes per day, and has been encouraged to not smoke until his next follow-up in 2 weeks. 08/15/17 on evaluation today patient's ulcers on his toes actually appear to be doing fairly well the right to ulcers may be a little bit more moist compared to the left I'm not really sure exactly why as the openings appear to be very small and I'm not seeing Velasquez, [name] ([id number]) any evidence of anything significant as far as you lightest or otherwise. Nonetheless this may just be a small setback he's been doing very well with the [facility] Dressing 08/22/17-He is here in follow-up evaluation for ulcerations to his bilateral first and second toes, there is small/scant amount of drainage noted on today's dressing. He continues with Hydrofera Blue. He continues to smoke, 1 cigarette a day. Voices no complaint or concerns, compliant and  compression therapy today. He'll follow-up next week 08/29/17-He is here in follow-up evaluation for ulcerations to his bilateral first and second toes, with a new wound to the left third toe. He states he cut himself while cutting the toenail. He presents today with more maceration and increased measurements; waxing and waning measurements and moisture has been an ongoing issue. He continues to smoke a proximally 1 cigarette per day, but states he has not smoked since Sunday. We will initiate medihoney daily and evaluate next week. He presents today without compression stockings 09/05/17-He is here in follow-up evaluation for ulcerations to bilateral first and second and left third toe. He did not pick up the medihoney and therefore has not been changing his dressing. He admits to inconsistent where of compression stockings, admits to pain to his bilateral lower extremities with unilateral edema (right greater than left). The ulcerations to multiple toes are more macerated and larger in size than last week. He is wearing compression therapy today. He admits to an overall feeling of weakness and fatigue and has been encouraged to contact his PCP for evaluation; he has a known history of anemia and intraabdominal lymphadenopathy. We will return to silvercel and he will follow up next week 09/19/17-He is here in follow-up evaluation for ulcerations to bilateral first and second toe.he was unable to make last week's appointment. The culture that was obtained on 5/30 grew clindamycin sensitive MRSA; he was initiated on 6/4 but did not start it until 6/7. There is significant improvement in all wounds, healing to left second and third toe. He also admits  to having no alcohol or smoking for the last 6 days. We will continue with same treatment plan I will extend the clindamycin for an additional 10 days and he will follow-up next week. 09/26/17- He is here in follow evaluation for multiple ulcerations to  multiple toes bilaterally. He continues to be non-compliant with dressing, compression therapy. He states he is taking the antibiotics as prescribed, although his recall is inconsistent. There is noted deterioration in all ulcers. We will continue same treatment plan, antibiotic therapy and he will follow up next week. 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 This information is obtained from the medical record, as he cannot articulate specifics to his diagnosis or treatment plan. 10/17/17-He is here in follow-up evaluation for multiple ulcerations to multiple toes bilaterally. There is chronic waxing and waning improvement/deterioration. On today's exam they appear improved, dry without maceration. He continues to apply a variety of topical agents, stating that he has switched between the blue product and silver product. In my opinion, this will be a chronic waxing and waning giving his intermittent compliance. He will follow-up in 2 weeks. As it relates to the biopsy taken from his buttock, he has not followed up with that and has not started radiation therapy. 10/31/17-He is here for evaluation for multiple ulcerations to multiple toes bilaterally. Continues with waxing and waning improvement versus deterioration. They are stable on today's exam without significant maceration. 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 We will continue with every 2 week follow-up appointments 11/14/17- He is seen in follow-up evaluation for multiple ulcerations to multiple bilateral toes. He is scheduled for excision of basal cell carcinoma to his right buttock on Monday 8/12. He continues to have chronic waxing and waning of his ulcers; today is a stable day with minimal drainage and left ear is better than right  toes. I will recount to infectious disease regarding chronic suppressive therapy. He admits to being more compliant with compression stockings, although he is not wearing any today. He has been encouraged to continue with dressing changes as ordered and compression therapy daily. He will be seen in 2 weeks 11/28/17-He is seen in follow up evaluation for multiple ulcerations to multiple toes bilaterally. He had an overnight admission at [facility] (8/11-8/12) for symptomatic anemia, received two units prbc. His surgical date has been moved to 9/94 excision of basal cell carcinoma to the right buttock. He has been noncompliant in wearing compression stockings, applying topical treatment as ordered; he has been applying a "cream", Hydrofera Blue, silvercel. He is currently on no antibiotic therapy; we will send for re-referral to ID for suppressive therapy given his multiple co-morbidities, poor surgical candidate, unwilling to have toe amputation and general non-adherence. 12/19/17 on evaluation today patient actually appears to be doing very well in regard to his toes at least compared to last time I saw him. He again has been coming to our office for quite a bit of time. 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 He states is hurting a little bit but doing well in general. I'm see were not taking care of this area dermatology is. He does have his appointment with infectious disease upcoming which  they re-consult for suppressive therapy that's next Monday. 01/02/18 on evaluation today patient actually appears to be doing rather well in regard to his toe ulcers although he tells me he has been put in an ointment on this which I am concerned may be causing some additional moisture buildup which we really do not want. He supposed be using silver cell which is mainly thing I want him to be  utilizing. Fortunately there does not appear to be any evidence of worsening infection which is good news. He has had surgery on his gluteal region where he had an excision of a cancerous area fortunately that seems to be doing well although he is having some discomfort. OLUSEGUN, GERSTENBERGER ([id number]) 02/06/18 on evaluation today patient actually appears to be doing well at all locations except for his second toe use bilaterally where the distal tip is still wrong open. He did state that he can't stand for dry skin to buildup on his toes subsequently as a question and further it sounds as if he actually pills this all which is likely keeping the toe is wrong and open. Also think that's what happened in the bottom of his foot although that appears to have healed at this point. There does not appear to be any evidence of infection. 02/20/18 on evaluation today patient appears to be doing better in regard to his foot ulcers. Everything has shown signs of improvement which is excellent news. Overall I'm very pleased with how things appear currently. The patient states he's not picking her point at any skin and that he wears shoes at all times when he is ambulating at home. Both are things that I've advocated and recommended for him in the past. 03/13/18 on evaluation today patient actually appears to be doing very well in regard to the bilateral toes in the overall appearance of the wound bed locations. Fortunately there does not appear to be any signs of infection at this time. He states that he is very pleased with how things seem to be progressing to be honest as am I. 03/27/1899 evaluation today patient appears to be doing well in regard to his toe ulcers. He just has a few areas remaining and these appear to be showing signs of improvement as well which is great news. Overall I have been impressed with the progress of the past several weeks. 04/10/18 on evaluation today patient appears to be doing a  little bit worse in regard to the right plantar foot at the regional the first metatarsal as well as the left second toe. He tells me that he ran out of bandages he also tells me he's been wearing his flip-flops a lot at home as well as a new pair of boots that he received as well. This is as opposed to the postop shoes that we had previously given him. Nonetheless overall I feel like that he has had a little bit more friction to the area which is causing things to look a little bit worse today. 04/24/18 on evaluation today patient's wounds for the most part appear to be doing excellent especially in regard to the bilateral second toes. With that being said he does have issues with the wound still on the first metatarsal region and first toe of the right foot which have not completely closed at this point. Nonetheless I do feel like that he is making progress which is good news. 05/08/18 on evaluation today patient's wounds appear to be doing just a little bit  worse than normal simply due to the fact that he has not had dressing supplies. He apparently owed $35 in order for them to be shipped and unfortunately is not able to pay that currently. We did give him information for the [employer] today which will hopefully help cover his dressing supplies. He was thankful for this he states is gonna contact them this afternoon. Fortunately otherwise or does not hear any signs of infection. 05/22/18 on evaluation today patient appears to be doing somewhat poorly in regard to his toes. Upon further questioning it appears that he actually has been pulling skin off of the toes which "drive him crazy". Subsequently as he does this he calls his new areas to tear open and I think this is one of the reasons why his toes are not healing as appropriately as we would like. He fortunately has no signs of infection at this time. With that being said I'm still concerned about the possibility of a fungal  infection even though there doesn't appear to be anything bacterial. Potentially nystatin powder could be of benefit for him as far as trying to help this in that regard. 06/05/18 on evaluation today patient actually appears to be doing very well in regard to his foot ulcers. Fortunately there's no signs of infection. He's been tolerating the dressing changes without complication. I'm very pleased with how things seem to be progressing. Fortunately there's no evidence of active infection at this point I do believe nystatin powder has been of benefit for him. 06/19/18 on evaluation today patient actually appears to be doing a little worse compared to last evaluation. Tells me that he has not had any supplies for two weeks. With that being said his plantar foot wounds on the right actually appear to be doing very well. It's actually a second toes in the first toes that are the worst. I think these are areas the was able to get to and pick and he tells me in fact that he destine to pick out some of the dry skin because it causes him annoyance. Nonetheless I think this is somewhat detrimental to him I think it's worth keeping part of the wound open in particular. Fortunately there is no sign of infection at this time although I think that is a risk which I discussed specifically that he's not careful you can end up reopening this and causing greater problems for himself. 07/03/18 on evaluation today patient's wound bed actually shows evidence of good granulation at this time and epithelialization as far as the wounds in general are concerned. He has been tolerating the dressing changes including the nystatin without complication. This is good news. With that being said he seems to be still having some trouble with the left second toe distally at this point. ROBI, DEWOLFE ([id number]) 4/9; most of the wounds look quite satisfactory. He requires debridement of the plantar right great toe otherwise  everything looks superficial to improved. His left second toe is healed. Objective Constitutional Patient is hypertensive.. Pulse regular and within target range for patient.Marland Kitchen Respirations regular, non-labored and within target range.. Temperature is normal and within the target range for the patient.Marland Kitchen appears in no distress. Vitals Time Taken: 2:53 PM, Height: 69 in, Weight: 168 lbs, BMI: 24.8, Temperature: 97.8 F, Pulse: 72 bpm, Respiratory Rate: 16 breaths/min, Blood Pressure: 157/77 mmHg. General Notes: Wound exam; The area on the right plantar great toe was debrided of skin and subcutaneous tissue. This cleans up quite nicely  The rest of the wounds look stable to improved and are superficial. No debridement was required this includes the right second met head right first toe dorsal and the right second toe dorsal. The left second toe was healed Integumentary (Hair, Skin) Wound #14 status is Healed - Epithelialized. Original cause of wound was Gradually Appeared. The wound is located on the Left Toe Second. The wound measures 0cm length x 0cm width x 0cm depth; 0cm^2 area and 0cm^3 volume. The wound is limited to skin breakdown. There is no tunneling or undermining noted. There is a medium amount of sanguinous drainage noted. The wound margin is indistinct and nonvisible. There is no granulation within the wound bed. There is no necrotic tissue within the wound bed. The periwound skin appearance exhibited: Scarring, Maceration, Hemosiderin Staining. The periwound skin appearance did not exhibit: Callus, Crepitus, Excoriation, Induration, Rash, Dry/Scaly, Atrophie Blanche, Cyanosis, Ecchymosis, Mottled, Pallor, Rubor, Erythema. The periwound has tenderness on palpation. Wound #16 status is Open. Original cause of wound was Not Known. The wound is located on the [employer]. The wound measures 0.9cm length x 0.8cm width x 0.1cm depth; 0.565cm^2 area and 0.057cm^3 volume. There is  Fat Layer (Subcutaneous Tissue) Exposed exposed. There is no tunneling or undermining noted. There is a medium amount of serous drainage noted. The wound margin is flat and intact. There is large (67-100%) pink granulation within the wound bed. There is no necrotic tissue within the wound bed. The periwound skin appearance exhibited: Scarring, Maceration. The periwound skin appearance did not exhibit: Callus, Crepitus, Excoriation, Induration, Rash, Dry/Scaly, Atrophie Blanche, Cyanosis, Ecchymosis, Hemosiderin Staining, Mottled, Pallor, Rubor, Erythema. Periwound temperature was noted as No Abnormality. Wound #17 status is Open. Original cause of wound was Trauma. The wound is located on the Right,Distal,Plantar Metatarsal head second. The wound measures 0.3cm length x 0.1cm width x 0.1cm depth; 0.024cm^2 area and 0.002cm^3 volume. There is Fat Layer (Subcutaneous Tissue) Exposed exposed. There is no tunneling or undermining noted. There is a medium amount of serous drainage noted. The wound margin is flat and intact. There is large (67-100%) pink granulation within the wound bed. There is no necrotic tissue within the wound bed. The periwound skin appearance exhibited: Scarring, Maceration. The periwound skin appearance did not exhibit: Callus, Crepitus, Excoriation, Induration, Rash, Dry/Scaly, Atrophie Blanche, Cyanosis, Ecchymosis, Hemosiderin Staining, Mottled, Pallor, Rubor, Erythema. Periwound temperature was noted as No Abnormality. Wound #18 status is Open. Original cause of wound was Gradually Appeared. The wound is located on the Right,[name] The wound measures 0.5cm length x 1cm width x 0.1cm depth; 0.393cm^2 area and 0.039cm^3 volume. There is no tunneling or undermining noted. There is a medium amount of serous drainage noted. The wound margin is flat and intact. There is large (67-100%) pink granulation within the wound bed. There is no necrotic tissue within the wound  bed. The periwound skin appearance exhibited: Scarring, Maceration. The periwound skin appearance did not exhibit: Callus, Crepitus, Excoriation, Induration, Rash, Dry/Scaly, Atrophie Blanche, Cyanosis, Ecchymosis, Hemosiderin Staining, Mottled, Pallor, Rubor, Erythema. Periwound temperature was noted as No Abnormality. Picardi, [name] ([id number]) Wound #2 status is Open. Original cause of wound was Gradually Appeared. The wound is located on the Right Toe Second. The wound measures 0.5cm length x 1.9cm width x 0.1cm depth; 0.746cm^2 area and 0.075cm^3 volume. The wound is limited to skin breakdown. There is no tunneling or undermining noted. There is a medium amount of serous drainage noted. The wound margin is indistinct and  nonvisible. There is large (67-100%) pink granulation within the wound bed. There is no necrotic tissue within the wound bed. The periwound skin appearance exhibited: Scarring, Maceration. The periwound skin appearance did not exhibit: Callus, Crepitus, Excoriation, Induration, Rash, Dry/Scaly, Atrophie Blanche, Cyanosis, Ecchymosis, Hemosiderin Staining, Mottled, Pallor, Rubor, Erythema. Periwound temperature was noted as No Abnormality. 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 There was a Selective/Open Wound Skin/Dermis Debridement with a total area of 0.18 sq cm performed by Ricard Dillon, MD. With the following instrument(s): Curette to remove Non-Viable tissue/material. Material removed includes Skin: Dermis. No specimens were taken. A time out was conducted at 15:17, prior to the start of the procedure. There was no bleeding. The procedure was tolerated well. Post Debridement Measurements: 0.9cm length x 0.8cm width x 0.1cm depth; 0.057cm^3 volume. Character of Wound/Ulcer Post Debridement is stable. Severity of Tissue Post Debridement is: Fat layer exposed. Post procedure Diagnosis Wound #16: Same as Pre-Procedure Plan Wound Cleansing: Wound #16 Right,Plantar Toe Great: Clean wound with Normal Saline. Wound #17 Right,Distal,Plantar Metatarsal head second: Clean wound with Normal Saline. Wound #18 Right,Dorsal Toe Great: Clean wound with Normal Saline. Wound #2 Right Toe Second: Lynn, Domenik E. ([id number]) Clean wound with Normal Saline. Anesthetic (add to Medication List): Wound #16 Right,Plantar Toe Great: Topical Lidocaine 4% cream applied to wound bed prior to debridement (In Clinic Only). Wound #17 Right,Distal,Plantar Metatarsal head second: Topical Lidocaine 4% cream applied to wound bed prior to debridement (In Clinic Only). Wound #18 Right,Dorsal Toe Great: Topical Lidocaine 4% cream applied to wound bed prior to debridement (In Clinic Only). Wound #2 Right Toe Second: Topical Lidocaine 4% cream applied to wound bed prior to debridement (In Clinic Only). Skin Barriers/Peri-Wound Care: Wound #16 Right,Plantar Toe Great: Antifungal powder-Nystatin Wound #17 Right,Distal,Plantar Metatarsal head second: Antifungal powder-Nystatin Wound #18 Right,Dorsal Toe Great: Antifungal powder-Nystatin Wound #2 Right Toe Second: Antifungal powder-Nystatin Primary Wound Dressing: Wound #16 Right,Plantar Toe Great: Silver Alginate -  secure with tape Wound #17 Right,Distal,Plantar Metatarsal head second: Silver Alginate - secure with tape Wound #18 Right,Dorsal Toe Great: Silver Alginate - secure with tape Wound #2 Right Toe Second: Silver Alginate - secure with tape Dressing Change Frequency: Wound #16 Right,Plantar Toe Great: Change dressing every day. Wound #17 Right,Distal,Plantar Metatarsal head second: Change dressing every day. Wound #18 Right,Dorsal Toe Great: Change dressing every day. Wound #2 Right Toe Second: Change dressing every day. Follow-up Appointments: Wound #16 Right,Plantar Toe Great: Return Appointment in 2 weeks. Wound #17 Right,Distal,Plantar Metatarsal head second: Return Appointment in 2 weeks. Wound #18 Right,Dorsal Toe Great: Return Appointment in 2 weeks. Wound #2 Right Toe Second: Return Appointment in 2 weeks. Edema Control: Wound #16 Right,Plantar Toe Great: Patient to wear own compression stockings Wound #17 Right,Distal,Plantar Metatarsal head second: Patient to wear own compression stockings Wound #18 Right,Dorsal Toe Great: Patient to wear own compression stockings Wound #2 Right Toe Second: Patient to wear own  compression stockings Additional Orders / Instructions: Wound #16 Right,Plantar Toe Great: Stop Smoking Increase protein intake. Wound #17 Right,Distal,Plantar Metatarsal head second: Patane, Lennix E. ([id number]) Stop Smoking Increase protein intake. Wound #18 Right,Dorsal Toe Great: Stop Smoking Increase protein intake. Wound #2 Right Toe Second: Stop Smoking Increase protein intake. Medications-please add to medication list.: Wound #16 Right,Plantar Toe Great: Other: - Nystatin Powder Wound #17 Right,Distal,Plantar Metatarsal head second: Other: - Nystatin Powder Wound #18 Right,Dorsal Toe Great: Other: - Nystatin Powder Wound #2 Right Toe Second: Other: - Nystatin Powder 1. Continue with silver alginate as the primary dressing Electronic  Signature(s) Signed: 07/17/2018 6:06:34 PM By: Linton Ham MD Entered By: Linton Ham on 07/17/2018 17:18:56 Montoro, [name] ([id number]) -------------------------------------------------------------------------------- SuperBill Details Patient Name: Martindelcampo, [name] Date of Service: 07/17/2018 Medical Record Number: [id number] Patient Account Number: [id number] Date of Birth/Sex: 1951-02-01 (68 y.o. 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.621 Type 2 diabetes mellitus with foot ulcer I87.323 Chronic venous hypertension (idiopathic) with inflammation of bilateral lower extremity I89.0 Lymphedema, not elsewhere classified F17.218 Nicotine dependence, cigarettes, 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 abuse 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, right ankle and foot Facility Procedures CPT4 Code Description: [id number] - DEBRIDE WOUND 1ST 20 SQ CM OR < ICD-10 Diagnosis Description L97.511 Non-pressure chronic ulcer of other part of right foot limited t Modifier: o breakdown of Quantity: 1 skin Physician Procedures CPT4 Code Description: 2500370 [id number] - WC PHYS DEBR WO ANESTH 20 SQ CM ICD-10 Diagnosis Description L97.511 Non-pressure chronic ulcer of other part of right foot limited to Modifier: breakdown of Quantity: 1 skin Electronic Signature(s) Signed: 07/17/2018 6:06:34 PM By: Linton Ham MD Entered By: Linton Ham on 07/17/2018 17:19:15

## 2018-07-18 ENCOUNTER — Inpatient Hospital Stay: Payer: Medicare Other | Attending: Oncology

## 2018-07-18 ENCOUNTER — Inpatient Hospital Stay: Payer: Medicare Other

## 2018-07-18 ENCOUNTER — Other Ambulatory Visit: Payer: Self-pay

## 2018-07-18 VITALS — BP 145/71 | HR 69

## 2018-07-18 DIAGNOSIS — Z79899 Other long term (current) drug therapy: Secondary | ICD-10-CM | POA: Diagnosis not present

## 2018-07-18 DIAGNOSIS — N189 Chronic kidney disease, unspecified: Secondary | ICD-10-CM | POA: Insufficient documentation

## 2018-07-18 DIAGNOSIS — D472 Monoclonal gammopathy: Secondary | ICD-10-CM | POA: Diagnosis not present

## 2018-07-18 DIAGNOSIS — D631 Anemia in chronic kidney disease: Secondary | ICD-10-CM

## 2018-07-18 DIAGNOSIS — D509 Iron deficiency anemia, unspecified: Secondary | ICD-10-CM

## 2018-07-18 DIAGNOSIS — E1122 Type 2 diabetes mellitus with diabetic chronic kidney disease: Secondary | ICD-10-CM | POA: Insufficient documentation

## 2018-07-18 DIAGNOSIS — I130 Hypertensive heart and chronic kidney disease with heart failure and stage 1 through stage 4 chronic kidney disease, or unspecified chronic kidney disease: Secondary | ICD-10-CM | POA: Diagnosis present

## 2018-07-18 DIAGNOSIS — F17210 Nicotine dependence, cigarettes, uncomplicated: Secondary | ICD-10-CM | POA: Insufficient documentation

## 2018-07-18 LAB — CBC
HCT: 24.4 % — ABNORMAL LOW (ref 39.0–52.0)
Hemoglobin: 7.9 g/dL — ABNORMAL LOW (ref 13.0–17.0)
MCH: 29.4 pg (ref 26.0–34.0)
MCHC: 32.4 g/dL (ref 30.0–36.0)
MCV: 90.7 fL (ref 80.0–100.0)
Platelets: 189 10*3/uL (ref 150–400)
RBC: 2.69 MIL/uL — ABNORMAL LOW (ref 4.22–5.81)
RDW: 14.2 % (ref 11.5–15.5)
WBC: 6.6 10*3/uL (ref 4.0–10.5)
nRBC: 0 % (ref 0.0–0.2)

## 2018-07-18 MED ORDER — EPOETIN ALFA 10000 UNIT/ML IJ SOLN
10000.0000 [IU] | Freq: Once | INTRAMUSCULAR | Status: AC
  Administered 2018-07-18: 11:00:00 10000 [IU] via SUBCUTANEOUS

## 2018-07-18 MED ORDER — EPOETIN ALFA-EPBX 40000 UNIT/ML IJ SOLN
40000.0000 [IU] | Freq: Once | INTRAMUSCULAR | Status: AC
  Administered 2018-07-18: 40000 [IU] via SUBCUTANEOUS
  Filled 2018-07-18: qty 1

## 2018-07-18 MED ORDER — EPOETIN ALFA-EPBX 40000 UNIT/ML IJ SOLN: 50000.0000 [IU] | Freq: Once | INTRAMUSCULAR | Status: DC

## 2018-07-21 NOTE — Progress Notes (Signed)
NAYEL, PURDY ([id number]) Visit Report for 07/17/2018 Arrival Information Details Patient Name: Tourangeau, JAKOBEE BRACKINS. Date of Service: 07/17/2018 2:15 PM Medical Record Number: [id number] Patient Account Number: [id number] Date of Birth/Sex: 1950/05/15 (68 y.o. M) Treating RN: Montey Hora Primary Care Tadarrius Burch: Lamonte Sakai Other Clinician: Referring Lesieli Bresee: Lamonte Sakai Treating Prim Morace/Extender: Tito Dine in Treatment: 74 Visit Information History Since Last Visit Added or deleted any medications: No Patient Arrived: Ambulatory Any new allergies or adverse reactions: No Arrival Time: 14:51 Had a fall or experienced change in No Accompanied By: self activities of daily living that may affect Transfer Assistance: None risk of falls: Patient Identification Verified: Yes Signs or symptoms of abuse/neglect since last visito No Secondary Verification Process Completed: Yes Hospitalized since last visit: No Patient Requires Transmission-Based No Implantable device outside of the clinic excluding No Precautions: cellular tissue based products placed in the center Patient Has Alerts: Yes since last visit: Patient Alerts: DMII Has Dressing in Place as Prescribed: Yes Pain Present Now: No Electronic Signature(s) Signed: 07/17/2018 4:45:44 PM By: Montey Hora Entered By: Montey Hora on 07/17/2018 14:51:49 Brege, [name] ([id number]) -------------------------------------------------------------------------------- Encounter Discharge Information Details Patient Name: Dust, Kapil E. Date of Service: 07/17/2018 2:15 PM Medical Record Number: [id number] Patient Account Number: [id number] Date of Birth/Sex: 05/20/1950 (68 y.o. M) Treating RN: Harold Barban Primary Care Tichina Koebel: Lamonte Sakai Other Clinician: Referring Eldo Umanzor: Lamonte Sakai Treating Fayez Sturgell/Extender: Tito Dine in Treatment: 19 Encounter Discharge Information Items Post Procedure  Vitals Discharge Condition: Stable Temperature (F): 97.8 Ambulatory Status: Ambulatory Pulse (bpm): 72 Discharge Destination: Home Respiratory Rate (breaths/min): 16 Transportation: Private Auto Blood Pressure (mmHg): 157/77 Accompanied By: self Schedule Follow-up Appointment: Yes Clinical Summary of Care: Electronic Signature(s) Signed: 07/21/2018 8:41:29 AM By: Harold Barban Entered By: Harold Barban on 07/17/2018 15:41:23 Goren, [name] ([id number]) -------------------------------------------------------------------------------- Lower Extremity Assessment Details Patient Name: Santillano, Jacquees E. Date of Service: 07/17/2018 2:15 PM Medical Record Number: [id number] Patient Account Number: [id number] Date of Birth/Sex: [birth year] (68 y.o. M) Treating RN: Montey Hora Primary Care Ryenne Lynam: Lamonte Sakai Other Clinician: Referring Mizuki Hoel: Lamonte Sakai Treating Jesson Foskey/Extender: Ricard Dillon Weeks in Treatment: 94 Edema Assessment Assessed: [Left: No] [Right: No] Edema: [Left: Yes] [Right: Yes] Vascular Assessment Pulses: Dorsalis Pedis Palpable: [Left:Yes] [Right:Yes] Electronic Signature(s) Signed: 07/17/2018 4:45:44 PM By: Montey Hora Entered By: Montey Hora on 07/17/2018 15:06:39 Konkel, Giovan E. ([id number]) -------------------------------------------------------------------------------- Multi Wound Chart Details Patient Name: Benecke, Mccrae E. Date of Service: 07/17/2018 2:15 PM Medical Record Number: [id number] Patient Account Number: [id number] Date of Birth/Sex: [birth year] (68 y.o. 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 Lower Extremity Lower Extremity Comorbid History: Anemia, Lymphedema, Anemia, Lymphedema, Anemia, Lymphedema, Congestive Heart Failure, Congestive Heart Failure, Congestive Heart Failure, Hypertension, Peripheral Hypertension, Peripheral Hypertension, Peripheral Venous Disease, Type II Venous Disease, Type II Venous Disease, Type II Diabetes, Neuropathy Diabetes, Neuropathy Diabetes, Neuropathy Date Acquired: 09/26/2017 10/29/2017 02/03/2018 Weeks of Treatment: 42 37 23 Wound Status: Healed - Epithelialized Open Open Pending Amputation on No No No Presentation: Measurements L  x W x D 0x0x0 0.9x0.8x0.1 0.3x0.1x0.1 (cm) Area (cm) : 0 0.565 0.024 Volume (cm) : 0 0.057 0.002 % Reduction in Area: 100.00% 64.70% 93.20% % Reduction in Volume: 100.00% 64.40% 94.30% Classification: Grade 1 Grade 1 Grade 1 Exudate Amount: Medium Medium Medium Exudate Type: Sanguinous Serous Serous Exudate Color: red amber amber Wound Margin: Indistinct, nonvisible Flat and Intact Flat and Intact Granulation Amount: None Present (0%) Large (67-100%) Large (67-100%) Granulation Quality: N/A Pink Pink Necrotic Amount: None Present (0%) None Present (0%) None Present (0%) Wacker, Hewitt E. ([id number]) Exposed Structures: Fascia: No Fat Layer (Subcutaneous Fat Layer (Subcutaneous Fat Layer (Subcutaneous Tissue) Exposed: Yes Tissue) Exposed: Yes Tissue) Exposed: No Fascia: No Fascia: No Tendon: No Tendon: No Tendon: No Muscle: No Muscle: No Muscle: No Joint: No Joint: No Joint: No Bone: No Bone: No Bone: No Limited to Skin Breakdown Epithelialization: Large (67-100%) Small (1-33%) Medium (34-66%) Debridement: N/A Debridement -  Selective/Open N/A Wound Pre-procedure N/A 15:17 N/A Verification/Time Out Taken: Level: N/A Skin/Dermis N/A Debridement Area (sq cm): N/A 0.18 N/A Instrument: N/A Curette N/A Bleeding: N/A None N/A Debridement Treatment N/A Procedure was tolerated well N/A Response: Post Debridement N/A 0.9x0.8x0.1 N/A Measurements L x W x D (cm) Post Debridement Volume: N/A 0.057 N/A (cm) Periwound Skin Texture: Scarring: Yes Scarring: Yes Scarring: Yes Excoriation: No Excoriation: No Excoriation: No Induration: No Induration: No Induration: No Callus: No Callus: No Callus: No Crepitus: No Crepitus: No Crepitus: No Rash: No Rash: No Rash: No Periwound Skin Moisture: Maceration: Yes Maceration: Yes Maceration: Yes Dry/Scaly: No Dry/Scaly: No Dry/Scaly: No Periwound Skin Color: Hemosiderin Staining: Yes Atrophie Blanche: No Atrophie Blanche: No Atrophie Blanche: No Cyanosis: No Cyanosis: No Cyanosis: No Ecchymosis: No Ecchymosis: No Ecchymosis: No Erythema: No Erythema: No Erythema: No Hemosiderin Staining: No Hemosiderin Staining: No Mottled: No Mottled: No Mottled: No Pallor: No Pallor: No Pallor: No Rubor: No Rubor: No Rubor: No Temperature: N/A No Abnormality No Abnormality Tenderness on Palpation: Yes No No Procedures Performed: N/A Debridement N/A Wound Number: [id number] N/A Photos: N/A Wound Location: Right Toe Great - Dorsal Right Toe Second N/A Wounding Event: Gradually Appeared Gradually Appeared N/A Primary Etiology: Diabetic Wound/Ulcer of the Diabetic Wound/Ulcer of the N/A Lower Extremity Lower Extremity Comorbid History: N/A No, Reginold E. ([id number]) Anemia, Lymphedema, Anemia, Lymphedema, Congestive Heart Failure, Congestive Heart Failure, Hypertension, Peripheral Hypertension, Peripheral Venous Disease, Type II Venous Disease, Type II Diabetes, Neuropathy Diabetes, Neuropathy Date Acquired: 05/22/2018 06/11/2016 N/A Weeks of Treatment: 8  94 N/A Wound Status: Open Open N/A Pending Amputation on No Yes N/A Presentation: Measurements L x W x D 0.5x1x0.1 0.5x1.9x0.1 N/A (cm) Area (cm) : 0.393 0.746 N/A Volume (cm) : 0.039 0.075 N/A % Reduction in Area: 50.90% 75.70% N/A % Reduction in Volume: 51.30% 75.60% N/A Classification: Grade 1 Grade 2 N/A Exudate Amount: Medium Medium N/A Exudate Type: Serous Serous N/A Exudate Color: amber amber N/A Wound Margin: Flat and Intact Indistinct, nonvisible N/A Granulation Amount: Large (67-100%) Large (67-100%) N/A Granulation Quality: Pink Pink N/A Necrotic Amount: None Present (0%) None Present (0%) N/A Exposed Structures: Fascia: No Fascia: No N/A Fat Layer (Subcutaneous Fat Layer (Subcutaneous Tissue) Exposed: No Tissue) Exposed: No Tendon: No Tendon: No Muscle: No Muscle: No Joint: No Joint: No Bone: No Bone: No Limited to Skin Breakdown Epithelialization: Medium (34-66%) Medium (34-66%) N/A Debridement: N/A N/A N/A Level: N/A N/A N/A Debridement Area (sq cm): N/A N/A N/A Instrument: N/A N/A N/A Bleeding: N/A N/A N/A Debridement Treatment N/A  N/A N/A Response: Post Debridement N/A N/A N/A Measurements L x W x D (cm) Post Debridement Volume: N/A N/A N/A (cm) Periwound Skin Texture: Scarring: Yes Scarring: Yes N/A Excoriation: No Excoriation: No Induration: No Induration: No Callus: No Callus: No Crepitus: No Crepitus: No Rash: No Rash: No Periwound Skin Moisture: Maceration: Yes Maceration: Yes N/A Dry/Scaly: No Dry/Scaly: No Periwound Skin Color: Atrophie Blanche: No Atrophie Blanche: No N/A Cyanosis: No Cyanosis: No Ecchymosis: No Ecchymosis: No Erythema: No Erythema: No Hemosiderin Staining: No Hemosiderin Staining: No Granda, Savon E. ([id number]) Mottled: No Mottled: No Pallor: No Pallor: No Rubor: No Rubor: No Temperature: No Abnormality No Abnormality N/A Tenderness on Palpation: No No N/A Procedures Performed: N/A N/A  N/A Treatment Notes Wound #16 (Right, Plantar Toe Great) Notes SILVER ALGINATE, CONFORM, TAPE Wound #17 (Right, Distal, Plantar Metatarsal head second) Notes SILVER ALGINATE, CONFORM, TAPE Wound #18 (Right, Dorsal Toe Great) Notes SILVER ALGINATE, CONFORM, TAPE Wound #2 (Right Toe Second) Notes SILVER ALGINATE, CONFORM, TAPE Electronic Signature(s) Signed: 07/17/2018 6:06:34 PM By: Linton Ham MD Entered By: Linton Ham on 07/17/2018 16:35:07 Labreck, [name] ([id number]) -------------------------------------------------------------------------------- [place] Details Patient Name: Miranda, Aran E. Date of Service: 07/17/2018 2:15 PM Medical Record Number: [id number] Patient Account Number: [id number] Date of Birth/Sex: 1950-04-17 (68 y.o. M) Treating RN: Cornell Barman Primary Care Sekai Nayak: Lamonte Sakai Other Clinician: Referring Sharrie Self: Lamonte Sakai Treating Tyshana Nishida/Extender: Tito Dine in Treatment: 59 Active Inactive Abuse / Safety / Falls / Self Care Management Nursing Diagnoses: Potential for falls Goals: Patient will remain injury free related to falls Date Initiated: 09/21/2016 Target Resolution Date: 09/14/2017 Goal Status: Active Interventions: Assess fall risk on admission and as needed Notes: Nutrition Nursing Diagnoses: Potential for alteratiion in Nutrition/Potential for imbalanced nutrition Goals: Patient/caregiver agrees to and verbalizes understanding of need to use nutritional supplements and/or vitamins as prescribed Date Initiated: 09/21/2016 Target Resolution Date: 09/14/2017 Goal Status: Active Interventions: Assess patient nutrition upon admission and as needed per policy Notes: Orientation to the Wound Care Program Nursing Diagnoses: Knowledge deficit related to the wound healing center program Goals: Patient/caregiver will verbalize understanding of the [facility] Date Initiated:  09/21/2016 Target Resolution Date: 06/15/2017 Goal Status: Active Interventions: Provide education on orientation to the wound center Bellew, Isacc E. ([id number]) Notes: Wound/Skin Impairment Nursing Diagnoses: Knowledge deficit related to smoking impact on wound healing Goals: Ulcer/skin breakdown will have a volume reduction of 30% by week 4 Date Initiated: 09/21/2016 Target Resolution Date: 08/17/2017 Goal Status: Active Ulcer/skin breakdown will have a volume reduction of 50% by week 8 Date Initiated: 09/21/2016 Target Resolution Date: 08/17/2017 Goal Status: Active Ulcer/skin breakdown will have a volume reduction of 80% by week 12 Date Initiated: 09/21/2016 Target Resolution Date: 09/14/2017 Goal Status: Active Ulcer/skin breakdown will heal within 14 weeks Date Initiated: 09/21/2016 Target Resolution Date: 08/17/2017 Goal Status: Active Interventions: Assess patient/caregiver ability to obtain necessary supplies Assess patient/caregiver ability to perform ulcer/skin care regimen upon admission and as needed Assess ulceration(s) every visit Notes: Electronic Signature(s) Signed: 07/17/2018 7:10:06 PM By: Gretta Cool, BSN, RN, CWS, Kim RN, BSN Entered By: Gretta Cool, BSN, RN, CWS, Kim on 07/17/2018 15:16:21 Dittrich, [name] ([id number]) -------------------------------------------------------------------------------- Pain Assessment Details Patient Name: Burruss, Uzair E. Date of Service: 07/17/2018 2:15 PM Medical Record Number: [id number] Patient Account Number: [id number] Date of Birth/Sex: [birth year] (68 y.o. M) Treating RN: Montey Hora Primary Care Santiago Graf: Lamonte Sakai Other Clinician: Referring Caelan Branden: Lamonte Sakai Treating Burlin Mcnair/Extender: Tito Dine in Treatment: 30  Active Problems Location of Pain Severity and Description of Pain Patient Has Paino No Site Locations Pain Management and Medication Current Pain Management: Electronic Signature(s) Signed: 07/17/2018  4:45:44 PM By: Montey Hora Entered By: Montey Hora on 07/17/2018 14:51:56 Getchell, [name] ([id number]) -------------------------------------------------------------------------------- Patient/Caregiver Education Details Patient Name: Bjelland, Talmadge E. Date of Service: 07/17/2018 2:15 PM Medical Record Number: [id number] Patient Account Number: [id number] Date of Birth/Gender: Feb 08, 1951 (68 y.o. M) Treating RN: Cornell Barman Primary Care Physician: Lamonte Sakai Other Clinician: Referring Physician: Lamonte Sakai Treating Physician/Extender: Tito Dine in Treatment: 34 Education Assessment Education Provided To: Patient Education Topics Provided Wound/Skin Impairment: Handouts: Caring for Your Ulcer Methods: Demonstration, Explain/Verbal Responses: State content correctly Electronic Signature(s) Signed: 07/17/2018 7:10:06 PM By: Gretta Cool, BSN, RN, CWS, Kim RN, BSN Entered By: Gretta Cool, BSN, RN, CWS, Kim on 07/17/2018 15:19:28 Spurlock, [name] ([id number]) -------------------------------------------------------------------------------- Wound Assessment Details Patient Name: Schaffer, Owin E. Date of Service: 07/17/2018 2:15 PM Medical Record Number: [id number] Patient Account Number: [id number] Date of Birth/Sex: Aug 25, 1950 (68 y.o. M) Treating RN: Cornell Barman Primary Care Abou Sterkel: Lamonte Sakai Other Clinician: Referring Maleia Weems: Lamonte Sakai Treating Kilyn Maragh/Extender: Tito Dine in Treatment: 94 Wound Status Wound Number: 14 Primary Diabetic Wound/Ulcer of the Lower Extremity Etiology: Wound Location: Left Toe Second Wound Healed - Epithelialized Wounding Event: Gradually Appeared Status: Date Acquired: 09/26/2017 Comorbid Anemia, Lymphedema, Congestive Heart Weeks Of Treatment: 42 History: Failure, Hypertension, Peripheral Venous Clustered Wound: No Disease, Type II Diabetes, Neuropathy Photos Wound Measurements Length: (cm) 0 Width: (cm) 0 Depth: (cm)  0 Area: (cm) 0 Volume: (cm) 0 % Reduction in Area: 100% % Reduction in Volume: 100% Epithelialization: Large (67-100%) Tunneling: No Undermining: No Wound Description Classification: Grade 1 Foul O Wound Margin: Indistinct, nonvisible Slough Exudate Amount: Medium Exudate Type: Sanguinous Exudate Color: red dor After Cleansing: No /Fibrino Yes Wound Bed Granulation Amount: None Present (0%) Exposed Structure Necrotic Amount: None Present (0%) Fascia Exposed: No Fat Layer (Subcutaneous Tissue) Exposed: No Tendon Exposed: No Muscle Exposed: No Joint Exposed: No Bone Exposed: No Limited to Skin Breakdown Periwound Skin Texture Beason, Caliph E. ([id number]) Texture Color No Abnormalities Noted: No No Abnormalities Noted: No Callus: No Atrophie Blanche: No Crepitus: No Cyanosis: No Excoriation: No Ecchymosis: No Induration: No Erythema: No Rash: No Hemosiderin Staining: Yes Scarring: Yes Mottled: No Pallor: No Moisture Rubor: No No Abnormalities Noted: No Dry / Scaly: No Temperature / Pain Maceration: Yes Tenderness on Palpation: Yes Electronic Signature(s) Signed: 07/17/2018 7:10:06 PM By: Gretta Cool, BSN, RN, CWS, Kim RN, BSN Entered By: Gretta Cool, BSN, RN, CWS, Kim on 07/17/2018 15:16:04 Mclelland, [name] ([id number]) -------------------------------------------------------------------------------- Wound Assessment Details Patient Name: Pekala, Devinn E. Date of Service: 07/17/2018 2:15 PM Medical Record Number: [id number] Patient Account Number: [id number] Date of Birth/Sex: 1950-05-26 (68 y.o. M) Treating RN: Montey Hora Primary Care Jeselle Hiser: Lamonte Sakai Other Clinician: Referring Angila Wombles: Lamonte Sakai Treating Aishia Barkey/Extender: Tito Dine in Treatment: 94 Wound Status Wound Number: 16 Primary Diabetic Wound/Ulcer of the Lower Extremity Etiology: Wound Location: Right Toe Great - Plantar Wound Open Wounding Event: Not Known Status: Date  Acquired: 10/29/2017 Comorbid Anemia, Lymphedema, Congestive Heart Weeks Of Treatment: 37 History: Failure, Hypertension, Peripheral Venous Clustered Wound: No Disease, Type II Diabetes, Neuropathy Photos Wound Measurements Length: (cm) 0.9 % Reduction Width: (cm) 0.8 % Reduction Depth: (cm) 0.1 Epithelializ Area: (cm) 0.565 Tunneling: Volume: (cm) 0.057 Undermining in Area: 64.7% in Volume: 64.4% ation: Small (1-33%) No : No Wound Description Classification: Grade 1 Foul  Odor A Wound Margin: Flat and Intact Slough/Fibr Exudate Amount: Medium Exudate Type: Serous Exudate Color: amber fter Cleansing: No ino Yes Wound Bed Granulation Amount: Large (67-100%) Exposed Structure Granulation Quality: Pink Fascia Exposed: No Necrotic Amount: None Present (0%) Fat Layer (Subcutaneous Tissue) Exposed: Yes Tendon Exposed: No Muscle Exposed: No Joint Exposed: No Bone Exposed: No Periwound Skin Texture Texture Color Debruin, Amerigo E. ([id number]) No Abnormalities Noted: No No Abnormalities Noted: No Callus: No Atrophie Blanche: No Crepitus: No Cyanosis: No Excoriation: No Ecchymosis: No Induration: No Erythema: No Rash: No Hemosiderin Staining: No Scarring: Yes Mottled: No Pallor: No Moisture Rubor: No No Abnormalities Noted: No Dry / Scaly: No Temperature / Pain Maceration: Yes Temperature: No Abnormality Treatment Notes Wound #16 (Right, Plantar Toe Great) Notes SILVER ALGINATE, CONFORM, TAPE Electronic Signature(s) Signed: 07/17/2018 4:45:44 PM By: Montey Hora Entered By: Montey Hora on 07/17/2018 15:02:23 Quijas, Erasmus E. ([id number]) -------------------------------------------------------------------------------- Wound Assessment Details Patient Name: Dezarn, Jabree E. Date of Service: 07/17/2018 2:15 PM Medical Record Number: [id number] Patient Account Number: [id number] Date of Birth/Sex: 04-Oct-1950 (68 y.o. M) Treating RN: Montey Hora Primary Care  Eldon Zietlow: Lamonte Sakai Other Clinician: Referring Sabri Teal: Lamonte Sakai Treating Sheilah Rayos/Extender: Tito Dine in Treatment: 94 Wound Status Wound Number: 17 Primary Diabetic Wound/Ulcer of the Lower Extremity Etiology: Wound Location: Right Metatarsal head second - Plantar, Distal Wound Open Status: Wounding Event: Trauma Comorbid Anemia, Lymphedema, Congestive Heart Date Acquired: 02/03/2018 History: Failure, Hypertension, Peripheral Venous Weeks Of Treatment: 23 Disease, Type II Diabetes, Neuropathy Clustered Wound: No Photos Wound Measurements Length: (cm) 0.3 Width: (cm) 0.1 Depth: (cm) 0.1 Area: (cm) 0.024 Volume: (cm) 0.002 % Reduction in Area: 93.2% % Reduction in Volume: 94.3% Epithelialization: Medium (34-66%) Tunneling: No Undermining: No Wound Description Classification: Grade 1 Wound Margin: Flat and Intact Exudate Amount: Medium Exudate Type: Serous Exudate Color: amber Foul Odor After Cleansing: No Slough/Fibrino Yes Wound Bed Granulation Amount: Large (67-100%) Exposed Structure Granulation Quality: Pink Fascia Exposed: No Necrotic Amount: None Present (0%) Fat Layer (Subcutaneous Tissue) Exposed: Yes Tendon Exposed: No Muscle Exposed: No Joint Exposed: No Bone Exposed: No Periwound Skin Texture Lem, Dario E. ([id number]) Texture Color No Abnormalities Noted: No No Abnormalities Noted: No Callus: No Atrophie Blanche: No Crepitus: No Cyanosis: No Excoriation: No Ecchymosis: No Induration: No Erythema: No Rash: No Hemosiderin Staining: No Scarring: Yes Mottled: No Pallor: No Moisture Rubor: No No Abnormalities Noted: No Dry / Scaly: No Temperature / Pain Maceration: Yes Temperature: No Abnormality Treatment Notes Wound #17 (Right, Distal, Plantar Metatarsal head second) Notes SILVER ALGINATE, CONFORM, TAPE Electronic Signature(s) Signed: 07/17/2018 4:45:44 PM By: Montey Hora Entered By: Montey Hora on  07/17/2018 15:03:16 Kashuba, Dquan E. ([id number]) -------------------------------------------------------------------------------- Wound Assessment Details Patient Name: [name], Nocholas E. Date of Service: 07/17/2018 2:15 PM Medical Record Number: [id number] Patient Account Number: [id number] Date of Birth/Sex: 10-25-1950 (68 y.o. M) Treating RN: Montey Hora Primary Care Rawn Quiroa: Lamonte Sakai Other Clinician: Referring Anishka Bushard: Lamonte Sakai Treating Shaterra Sanzone/Extender: Tito Dine in Treatment: 94 Wound Status Wound Number: 18 Primary Diabetic Wound/Ulcer of the Lower Extremity Etiology: Wound Location: Right Toe Great - Dorsal Wound Open Wounding Event: Gradually Appeared Status: Date Acquired: 05/22/2018 Comorbid Anemia, Lymphedema, Congestive Heart Weeks Of Treatment: 8 History: Failure, Hypertension, Peripheral Venous Clustered Wound: No Disease, Type II Diabetes, Neuropathy Photos Wound Measurements Length: (cm) 0.5 % Reduction Width: (cm) 1 % Reduction Depth: (cm) 0.1 Epithelializ Area: (cm) 0.393 Tunneling: Volume: (cm) 0.039 Undermining in Area: 50.9% in Volume: 51.3% ation:  Medium (34-66%) No : No Wound Description Classification: Grade 1 Foul Odor A Wound Margin: Flat and Intact Slough/Fibr Exudate Amount: Medium Exudate Type: Serous Exudate Color: amber fter Cleansing: No ino No Wound Bed Granulation Amount: Large (67-100%) Exposed Structure Granulation Quality: Pink Fascia Exposed: No Necrotic Amount: None Present (0%) Fat Layer (Subcutaneous Tissue) Exposed: No Tendon Exposed: No Muscle Exposed: No Joint Exposed: No Bone Exposed: No Periwound Skin Texture Texture Color Marmo, Dwyne E. ([id number]) No Abnormalities Noted: No No Abnormalities Noted: No Callus: No Atrophie Blanche: No Crepitus: No Cyanosis: No Excoriation: No Ecchymosis: No Induration: No Erythema: No Rash: No Hemosiderin Staining: No Scarring: Yes Mottled:  No Pallor: No Moisture Rubor: No No Abnormalities Noted: No Dry / Scaly: No Temperature / Pain Maceration: Yes Temperature: No Abnormality Treatment Notes Wound #18 (Right, Dorsal Toe Great) Notes SILVER ALGINATE, CONFORM, TAPE Electronic Signature(s) Signed: 07/17/2018 4:45:44 PM By: Montey Hora Entered By: Montey Hora on 07/17/2018 15:04:46 Shifflet, Izaah E. ([id number]) -------------------------------------------------------------------------------- Wound Assessment Details Patient Name: Kohlbeck, Peyten E. Date of Service: 07/17/2018 2:15 PM Medical Record Number: [id number] Patient Account Number: [id number] Date of Birth/Sex: 11/02/1950 (68 y.o. M) Treating RN: Montey Hora Primary Care Jakylah Bassinger: Lamonte Sakai Other Clinician: Referring Samar Dass: Lamonte Sakai Treating Paislynn Hegstrom/Extender: Tito Dine in Treatment: 94 Wound Status Wound Number: 2 Primary Diabetic Wound/Ulcer of the Lower Extremity Etiology: Wound Location: Right Toe Second Wound Open Wounding Event: Gradually Appeared Status: Date Acquired: 06/11/2016 Comorbid Anemia, Lymphedema, Congestive Heart Weeks Of Treatment: 94 History: Failure, Hypertension, Peripheral Venous Clustered Wound: No Disease, Type II Diabetes, Neuropathy Pending Amputation On Presentation Photos Wound Measurements Length: (cm) 0.5 Width: (cm) 1.9 Depth: (cm) 0.1 Area: (cm) 0.746 Volume: (cm) 0.075 % Reduction in Area: 75.7% % Reduction in Volume: 75.6% Epithelialization: Medium (34-66%) Tunneling: No Undermining: No Wound Description Classification: Grade 2 Foul Odor Wound Margin: Indistinct, nonvisible Slough/Fib Exudate Amount: Medium Exudate Type: Serous Exudate Color: amber After Cleansing: No rino No Wound Bed Granulation Amount: Large (67-100%) Exposed Structure Granulation Quality: Pink Fascia Exposed: No Necrotic Amount: None Present (0%) Fat Layer (Subcutaneous Tissue) Exposed: No Tendon  Exposed: No Muscle Exposed: No Joint Exposed: No Bone Exposed: No Limited to Skin Breakdown Etsitty, Hameed E. ([id number]) Periwound Skin Texture Texture Color No Abnormalities Noted: No No Abnormalities Noted: No Callus: No Atrophie Blanche: No Crepitus: No Cyanosis: No Excoriation: No Ecchymosis: No Induration: No Erythema: No Rash: No Hemosiderin Staining: No Scarring: Yes Mottled: No Pallor: No Moisture Rubor: No No Abnormalities Noted: No Dry / Scaly: No Temperature / Pain Maceration: Yes Temperature: No Abnormality Treatment Notes Wound #2 (Right Toe Second) Notes SILVER ALGINATE, CONFORM, TAPE Electronic Signature(s) Signed: 07/17/2018 4:45:44 PM By: Montey Hora Entered By: Montey Hora on 07/17/2018 15:05:58 Plantz, [name] ([id number]) -------------------------------------------------------------------------------- Vitals Details Patient Name: Remmert, Kayston E. Date of Service: 07/17/2018 2:15 PM Medical Record Number: [id number] Patient Account Number: [id number] Date of Birth/Sex: 07-17-1950 (68 y.o. M) Treating RN: Montey Hora Primary Care Mirza Kidney: Lamonte Sakai Other Clinician: Referring Kennis Buell: Lamonte Sakai Treating Ramari Bray/Extender: Tito Dine in Treatment: 48 Vital Signs Time Taken: 14:53 Temperature (F): 97.8 Height (in): 69 Pulse (bpm): 72 Weight (lbs): 168 Respiratory Rate (breaths/min): 16 Body Mass Index (BMI): 24.8 Blood Pressure (mmHg): 157/77 Reference Range: 80 - 120 mg / dl Electronic Signature(s) Signed: 07/17/2018 4:45:44 PM By: Montey Hora Entered By: Montey Hora on 07/17/2018 14:55:47

## 2018-08-14 ENCOUNTER — Other Ambulatory Visit: Payer: Self-pay

## 2018-08-14 ENCOUNTER — Encounter: Payer: Medicare Other | Attending: Physician Assistant | Admitting: Physician Assistant

## 2018-08-14 DIAGNOSIS — I110 Hypertensive heart disease with heart failure: Secondary | ICD-10-CM | POA: Insufficient documentation

## 2018-08-14 DIAGNOSIS — L97521 Non-pressure chronic ulcer of other part of left foot limited to breakdown of skin: Secondary | ICD-10-CM | POA: Insufficient documentation

## 2018-08-14 DIAGNOSIS — L97511 Non-pressure chronic ulcer of other part of right foot limited to breakdown of skin: Secondary | ICD-10-CM | POA: Insufficient documentation

## 2018-08-14 DIAGNOSIS — E11621 Type 2 diabetes mellitus with foot ulcer: Secondary | ICD-10-CM | POA: Insufficient documentation

## 2018-08-14 DIAGNOSIS — I87323 Chronic venous hypertension (idiopathic) with inflammation of bilateral lower extremity: Secondary | ICD-10-CM | POA: Insufficient documentation

## 2018-08-14 DIAGNOSIS — I509 Heart failure, unspecified: Secondary | ICD-10-CM | POA: Diagnosis not present

## 2018-08-14 DIAGNOSIS — F17200 Nicotine dependence, unspecified, uncomplicated: Secondary | ICD-10-CM | POA: Diagnosis not present

## 2018-08-14 DIAGNOSIS — E1140 Type 2 diabetes mellitus with diabetic neuropathy, unspecified: Secondary | ICD-10-CM | POA: Insufficient documentation

## 2018-08-14 DIAGNOSIS — I890 Lymphedema, not elsewhere classified: Secondary | ICD-10-CM | POA: Diagnosis not present

## 2018-08-15 ENCOUNTER — Inpatient Hospital Stay: Payer: Medicare Other

## 2018-08-15 ENCOUNTER — Inpatient Hospital Stay: Payer: Medicare Other | Attending: Oncology

## 2018-08-15 ENCOUNTER — Other Ambulatory Visit: Payer: Self-pay

## 2018-08-15 VITALS — BP 160/77 | HR 76

## 2018-08-15 DIAGNOSIS — D509 Iron deficiency anemia, unspecified: Secondary | ICD-10-CM

## 2018-08-15 DIAGNOSIS — E1122 Type 2 diabetes mellitus with diabetic chronic kidney disease: Secondary | ICD-10-CM | POA: Diagnosis not present

## 2018-08-15 DIAGNOSIS — Z79899 Other long term (current) drug therapy: Secondary | ICD-10-CM | POA: Insufficient documentation

## 2018-08-15 DIAGNOSIS — N189 Chronic kidney disease, unspecified: Secondary | ICD-10-CM | POA: Insufficient documentation

## 2018-08-15 DIAGNOSIS — I130 Hypertensive heart and chronic kidney disease with heart failure and stage 1 through stage 4 chronic kidney disease, or unspecified chronic kidney disease: Secondary | ICD-10-CM | POA: Insufficient documentation

## 2018-08-15 DIAGNOSIS — F17210 Nicotine dependence, cigarettes, uncomplicated: Secondary | ICD-10-CM | POA: Insufficient documentation

## 2018-08-15 DIAGNOSIS — D472 Monoclonal gammopathy: Secondary | ICD-10-CM | POA: Insufficient documentation

## 2018-08-15 DIAGNOSIS — D631 Anemia in chronic kidney disease: Secondary | ICD-10-CM | POA: Insufficient documentation

## 2018-08-15 LAB — CBC
HCT: 26.1 % — ABNORMAL LOW (ref 39.0–52.0)
Hemoglobin: 8.5 g/dL — ABNORMAL LOW (ref 13.0–17.0)
MCH: 30.1 pg (ref 26.0–34.0)
MCHC: 32.6 g/dL (ref 30.0–36.0)
MCV: 92.6 fL (ref 80.0–100.0)
Platelets: 157 10*3/uL (ref 150–400)
RBC: 2.82 MIL/uL — ABNORMAL LOW (ref 4.22–5.81)
RDW: 14 % (ref 11.5–15.5)
WBC: 7.2 10*3/uL (ref 4.0–10.5)
nRBC: 0 % (ref 0.0–0.2)

## 2018-08-15 MED ORDER — EPOETIN ALFA-EPBX 40000 UNIT/ML IJ SOLN
40000.0000 [IU] | Freq: Once | INTRAMUSCULAR | Status: AC
  Administered 2018-08-15: 14:00:00 40000 [IU] via SUBCUTANEOUS
  Filled 2018-08-15: qty 1

## 2018-08-15 MED ORDER — EPOETIN ALFA-EPBX 10000 UNIT/ML IJ SOLN
10000.0000 [IU] | Freq: Once | INTRAMUSCULAR | Status: AC
  Administered 2018-08-15: 10000 [IU] via SUBCUTANEOUS
  Filled 2018-08-15: qty 1

## 2018-08-15 MED ORDER — EPOETIN ALFA-EPBX 40000 UNIT/ML IJ SOLN: 50000.0000 [IU] | Freq: Once | INTRAMUSCULAR | Status: DC

## 2018-08-18 LAB — MULTIPLE MYELOMA PANEL, SERUM
Albumin SerPl Elph-Mcnc: 2.9 g/dL (ref 2.9–4.4)
Albumin/Glob SerPl: 0.8 (ref 0.7–1.7)
Alpha 1: 0.2 g/dL (ref 0.0–0.4)
Alpha2 Glob SerPl Elph-Mcnc: 0.8 g/dL (ref 0.4–1.0)
B-Globulin SerPl Elph-Mcnc: 0.8 g/dL (ref 0.7–1.3)
Gamma Glob SerPl Elph-Mcnc: 2.1 g/dL — ABNORMAL HIGH (ref 0.4–1.8)
Globulin, Total: 3.9 g/dL (ref 2.2–3.9)
IgA: 250 mg/dL (ref 61–437)
IgG (Immunoglobin G), Serum: 2165 mg/dL — ABNORMAL HIGH (ref 603–1613)
IgM (Immunoglobulin M), Srm: 82 mg/dL (ref 20–172)
M Protein SerPl Elph-Mcnc: 1.1 g/dL — ABNORMAL HIGH
Total Protein ELP: 6.8 g/dL (ref 6.0–8.5)

## 2018-08-18 LAB — KAPPA/LAMBDA LIGHT CHAINS
Kappa free light chain: 147 mg/L — ABNORMAL HIGH (ref 3.3–19.4)
Kappa, lambda light chain ratio: 1.45 (ref 0.26–1.65)
Lambda free light chains: 101.7 mg/L — ABNORMAL HIGH (ref 5.7–26.3)

## 2018-08-18 NOTE — Progress Notes (Signed)
[name], [name] ([id number]) Visit Report for 08/14/2018 Arrival Information Details Patient Name: [name], [name] Date of Service: 08/14/2018 8:15 AM Medical Record Number: [id number] Patient Account Number: [id number] Date of Birth/Sex: 13-Nov-1950 (68 y.o. M) Treating RN: Cornell Barman Primary Care Jennet Scroggin: Lamonte Sakai Other Clinician: Referring Aylen Stradford: Lamonte Sakai Treating Tyshaun Vinzant/Extender: Melburn Hake, HOYT Weeks in Treatment: 9 Visit Information History Since Last Visit Added or deleted any medications: No Patient Arrived: Ambulatory Any new allergies or adverse reactions: No Arrival Time: 08:29 Had a fall or experienced change in No Accompanied By: self activities of daily living that may affect Transfer Assistance: None risk of falls: Patient Identification Verified: Yes Signs or symptoms of abuse/neglect since last visito No Secondary Verification Process Completed: Yes Hospitalized since last visit: No Patient Requires Transmission-Based No Implantable device outside of the clinic excluding No Precautions: cellular tissue based products placed in the center Patient Has Alerts: Yes since last visit: Patient Alerts: DMII Has Dressing in Place as Prescribed: Yes Pain Present Now: No Electronic Signature(s) Signed: 08/14/2018 5:18:06 PM By: Gretta Cool, BSN, RN, CWS, Kim RN, BSN Entered By: Gretta Cool, BSN, RN, CWS, Kim on 08/14/2018 08:29:35 Reader, [name] ([id number]) -------------------------------------------------------------------------------- Clinic Level of Care Assessment Details Patient Name: [name], [name] Date of Service: 08/14/2018 8:15 AM Medical Record Number: [id number] Patient Account Number: [id number] Date of Birth/Sex: November 13, 1950 (68 y.o. M) Treating RN: Cornell Barman Primary Care Salinda Snedeker: Lamonte Sakai Other Clinician: Referring Kross Swallows: Lamonte Sakai Treating Riannon Mukherjee/Extender: Melburn Hake, HOYT Weeks in Treatment: 20 Clinic Level of Care Assessment Items TOOL 4  Quantity Score []  - Use when only an EandM is performed on FOLLOW-UP visit 0 ASSESSMENTS - Nursing Assessment / Reassessment []  - Reassessment of Co-morbidities (includes updates in patient status) 0 X- 1 5 Reassessment of Adherence to Treatment Plan ASSESSMENTS - Wound and Skin Assessment / Reassessment []  - Simple Wound Assessment / Reassessment - one wound 0 X- 4 5 Complex Wound Assessment / Reassessment - multiple wounds []  - 0 Dermatologic / Skin Assessment (not related to wound area) ASSESSMENTS - Focused Assessment []  - Circumferential Edema Measurements - multi extremities 0 []  - 0 Nutritional Assessment / Counseling / Intervention []  - 0 Lower Extremity Assessment (monofilament, tuning fork, pulses) []  - 0 Peripheral Arterial Disease Assessment (using hand held doppler) ASSESSMENTS - Ostomy and/or Continence Assessment and Care []  - Incontinence Assessment and Management 0 []  - 0 Ostomy Care Assessment and Management (repouching, etc.) PROCESS - Coordination of Care X - Simple Patient / Family Education for ongoing care 1 15 []  - 0 Complex (extensive) Patient / Family Education for ongoing care X- 1 10 Staff obtains Programmer, systems, Records, Test Results / Process Orders []  - 0 Staff telephones HHA, Nursing Homes / Clarify orders / etc []  - 0 Routine Transfer to another Facility (non-emergent condition) []  - 0 Routine Hospital Admission (non-emergent condition) []  - 0 New Admissions / Biomedical engineer / Ordering NPWT, Apligraf, etc. []  - 0 Emergency Hospital Admission (emergent condition) X- 1 10 Simple Discharge Coordination [name], [name] ([id number]) []  - 0 Complex (extensive) Discharge Coordination PROCESS - Special Needs []  - Pediatric / Minor Patient Management 0 []  - 0 Isolation Patient Management []  - 0 Hearing / Language / Visual special needs []  - 0 Assessment of Community assistance (transportation, D/C planning, etc.) []  - 0 Additional  assistance / Altered mentation []  - 0 Support Surface(s) Assessment (bed, cushion, seat, etc.) INTERVENTIONS - Wound Cleansing / Measurement []  - Simple Wound Cleansing -  one wound 0 X- 4 5 Complex Wound Cleansing - multiple wounds X- 1 5 Wound Imaging (photographs - any number of wounds) []  - 0 Wound Tracing (instead of photographs) []  - 0 Simple Wound Measurement - one wound X- 4 5 Complex Wound Measurement - multiple wounds INTERVENTIONS - Wound Dressings X - Small Wound Dressing one or multiple wounds 3 10 []  - 0 Medium Wound Dressing one or multiple wounds []  - 0 Large Wound Dressing one or multiple wounds []  - 0 Application of Medications - topical []  - 0 Application of Medications - injection INTERVENTIONS - Miscellaneous []  - External ear exam 0 []  - 0 Specimen Collection (cultures, biopsies, blood, body fluids, etc.) []  - 0 Specimen(s) / Culture(s) sent or taken to Lab for analysis []  - 0 Patient Transfer (multiple staff / Civil Service fast streamer / Similar devices) []  - 0 Simple Staple / Suture removal (25 or less) []  - 0 Complex Staple / Suture removal (26 or more) []  - 0 Hypo / Hyperglycemic Management (close monitor of Blood Glucose) []  - 0 Ankle / Brachial Index (ABI) - do not check if billed separately X- 1 5 Vital Signs Berkery, Deion E. ([id number]) Has the patient been seen at the hospital within the last three years: Yes Total Score: 140 Level Of Care: New/Established - Level 4 Electronic Signature(s) Signed: 08/14/2018 5:18:06 PM By: Gretta Cool, BSN, RN, CWS, Kim RN, BSN Entered By: Gretta Cool, BSN, RN, CWS, Kim on 08/14/2018 08:56:19 Azzarello, [name] ([id number]) -------------------------------------------------------------------------------- Encounter Discharge Information Details Patient Name: [name], [name] Date of Service: 08/14/2018 8:15 AM Medical Record Number: [id number] Patient Account Number: [id number] Date of Birth/Sex: February 04, 1951 (68 y.o. M) Treating RN:  Cornell Barman Primary Care Marcanthony Sleight: Lamonte Sakai Other Clinician: Referring Matisse Roskelley: Lamonte Sakai Treating Elaynah Virginia/Extender: Melburn Hake, HOYT Weeks in Treatment: 65 Encounter Discharge Information Items Discharge Condition: Stable Ambulatory Status: Ambulatory Discharge Destination: Home Transportation: Private Auto Accompanied By: self Schedule Follow-up Appointment: Yes Clinical Summary of Care: Electronic Signature(s) Signed: 08/14/2018 5:18:06 PM By: Gretta Cool, BSN, RN, CWS, Kim RN, BSN Entered By: Gretta Cool, BSN, RN, CWS, Kim on 08/14/2018 08:57:31 Castilleja, [name] ([id number]) -------------------------------------------------------------------------------- Lower Extremity Assessment Details Patient Name: Conkel, Beuford E. Date of Service: 08/14/2018 8:15 AM Medical Record Number: [id number] Patient Account Number: [id number] Date of Birth/Sex: 08-Feb-1951 (68 y.o. M) Treating RN: Cornell Barman Primary Care Shelita Steptoe: Lamonte Sakai Other Clinician: Referring Kera Deacon: Lamonte Sakai Treating Caretha Rumbaugh/Extender: Melburn Hake, HOYT Weeks in Treatment: 50 Vascular Assessment Pulses: Dorsalis Pedis Palpable: [Right:Yes] Electronic Signature(s) Signed: 08/14/2018 5:18:06 PM By: Gretta Cool, BSN, RN, CWS, Kim RN, BSN Entered By: Gretta Cool, BSN, RN, CWS, Kim on 08/14/2018 08:42:43 Charpentier, [name] ([id number]) -------------------------------------------------------------------------------- Multi Wound Chart Details Patient Name: Cabriales, Davidjames E. Date of Service: 08/14/2018 8:15 AM Medical Record Number: [id number] Patient Account Number: [id number] Date of Birth/Sex: 18-Jan-1951 (68 y.o. M) Treating RN: Cornell Barman Primary Care Tais Koestner: Lamonte Sakai Other Clinician: Referring Ragina Fenter: Lamonte Sakai Treating Loki Wuthrich/Extender: Melburn Hake, HOYT Weeks in Treatment: 74 Vital Signs Height(in): 61 Pulse(bpm): 29 Weight(lbs): 168 Blood Pressure(mmHg): 171/78 Body Mass Index(BMI): 25 Temperature(F): 98.2 Respiratory  Rate 16 (breaths/min): Photos: Wound Location: Right Toe Great - Plantar Right, Plantar Toe Great Right, Distal, Plantar Metatarsal head second Wounding Event: Not Known Not Known Trauma Primary Etiology: Diabetic Wound/Ulcer of 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 Lower Extremity Lower Extremity Comorbid History: Anemia, Lymphedema, Anemia, Lymphedema, Anemia, Lymphedema, Congestive Heart Failure, Congestive Heart Failure, Congestive Heart Failure, Hypertension, Peripheral Hypertension, Peripheral  Hypertension, Peripheral Venous Disease, Type II Venous Disease, Type II Venous Disease, Type II Diabetes, Neuropathy Diabetes, Neuropathy Diabetes, Neuropathy Date Acquired: 10/29/2017 10/29/2017 02/03/2018 Weeks of Treatment: 41 41 27 Wound Status: Open Open Healed - Epithelialized Pending Amputation on No No No Presentation: Measurements L x W x D 0.8x1x0.1 0.4x0.3x0.2 0x0x0 (cm) Area (cm) : 0.628 0.094 0 Volume (cm) : 0.063 0.019 0 % Reduction in Area: 60.80% 94.10% 100.00% % Reduction in Volume: 60.60% 88.10% 100.00% Classification: Grade 1 Grade 1 Grade 1 Exudate Amount: Medium Medium None Present Exudate Type: Serous Serous N/A Exudate Color: amber amber N/A Wound Margin: Flat and Intact Flat and Intact Flat and Intact Granulation Amount: Large (67-100%) Large (67-100%) None Present (0%) Granulation Quality: Pink Pink N/A Necrotic Amount: Small (1-33%) Small (1-33%) None Present (0%) Ryles, Jiovany E. ([id number]) Exposed Structures: Fat Layer (Subcutaneous Fat Layer (Subcutaneous Fascia: No Tissue) Exposed: Yes Tissue) Exposed: Yes Fat Layer (Subcutaneous Fascia: No Fascia: No Tissue) Exposed: No Tendon: No Tendon: No Tendon: No Muscle: No Muscle: No Muscle: No Joint: No Joint: No Joint: No Bone: No Bone: No Bone: No Epithelialization: Small (1-33%) Small (1-33%) Large (67-100%) Periwound Skin Texture: No Abnormalities  Noted Scarring: Yes Scarring: Yes Excoriation: No Excoriation: No Induration: No Induration: No Callus: No Callus: No Crepitus: No Crepitus: No Rash: No Rash: No Periwound Skin Moisture: No Abnormalities Noted Maceration: No Dry/Scaly: Yes Dry/Scaly: No Maceration: No Periwound Skin Color: No Abnormalities Noted Atrophie Blanche: No Atrophie Blanche: No Cyanosis: No Cyanosis: No Ecchymosis: No Ecchymosis: No Erythema: No Erythema: No Hemosiderin Staining: No Hemosiderin Staining: No Mottled: No Mottled: No Pallor: No Pallor: No Rubor: No Rubor: No Temperature: N/A No Abnormality No Abnormality Tenderness on Palpation: No No No Wound Number: 18 2 N/A Photos: N/A Wound Location: Right Toe Great - Dorsal Right Toe Second N/A Wounding Event: Gradually Appeared Gradually Appeared N/A Primary Etiology: Diabetic Wound/Ulcer of the Diabetic Wound/Ulcer of the N/A Lower Extremity Lower Extremity Comorbid History: Anemia, Lymphedema, Anemia, Lymphedema, N/A Congestive Heart Failure, Congestive Heart Failure, Hypertension, Peripheral Hypertension, Peripheral Venous Disease, Type II Venous Disease, Type II Diabetes, Neuropathy Diabetes, Neuropathy Date Acquired: 05/22/2018 06/11/2016 N/A Weeks of Treatment: 12 98 N/A Wound Status: Open Open N/A Pending Amputation on No Yes N/A Presentation: Measurements L x W x D 0.5x1.2x0.1 0.1x0.1x0.1 N/A (cm) Area (cm) : 0.471 0.008 N/A Volume (cm) : 0.047 0.001 N/A % Reduction in Area: 41.20% 99.70% N/A % Reduction in Volume: 41.30% 99.70% N/A Classification: Grade 1 Grade 2 N/A Exudate Amount: Medium Small N/A Holtzclaw, Jonn E. ([id number]) Exudate Type: Serous Serous N/A Exudate Color: amber amber N/A Wound Margin: Flat and Intact Indistinct, nonvisible N/A Granulation Amount: Large (67-100%) Large (67-100%) N/A Granulation Quality: Pink Pink N/A Necrotic Amount: None Present (0%) None Present (0%) N/A Exposed  Structures: Fascia: No Fascia: No N/A Fat Layer (Subcutaneous Fat Layer (Subcutaneous Tissue) Exposed: No Tissue) Exposed: No Tendon: No Tendon: No Muscle: No Muscle: No Joint: No Joint: No Bone: No Bone: No Limited to Skin Breakdown Epithelialization: Medium (34-66%) Large (67-100%) N/A Periwound Skin Texture: Scarring: Yes Scarring: Yes N/A Excoriation: No Excoriation: No Induration: No Induration: No Callus: No Callus: No Crepitus: No Crepitus: No Rash: No Rash: No Periwound Skin Moisture: Maceration: No Maceration: No N/A Dry/Scaly: No Dry/Scaly: No Periwound Skin Color: Atrophie Blanche: No Atrophie Blanche: No N/A Cyanosis: No Cyanosis: No Ecchymosis: No Ecchymosis: No Erythema: No Erythema: No Hemosiderin Staining: No Hemosiderin Staining: No Mottled: No Mottled: No Pallor: No  Pallor: No Rubor: No Rubor: No Temperature: No Abnormality No Abnormality N/A Tenderness on Palpation: No No N/A Treatment Notes Electronic Signature(s) Signed: 08/14/2018 5:18:06 PM By: Gretta Cool, BSN, RN, CWS, Kim RN, BSN Entered By: Gretta Cool, BSN, RN, CWS, Kim on 08/14/2018 08:48:11 Massey, [name] ([id number]) -------------------------------------------------------------------------------- [place] Details Patient Name: Mcclenny, Jubal E. Date of Service: 08/14/2018 8:15 AM Medical Record Number: [id number] Patient Account Number: [id number] Date of Birth/Sex: 1950/05/26 (68 y.o. M) Treating RN: Cornell Barman Primary Care Dalon Reichart: Lamonte Sakai Other Clinician: Referring Galaxy Borden: Lamonte Sakai Treating [name] [name]/Extender: Melburn Hake, HOYT Weeks in Treatment: 45 Active Inactive Abuse / Safety / Falls / Self Care Management Nursing Diagnoses: Potential for falls Goals: Patient will remain injury free related to falls Date Initiated: 09/21/2016 Target Resolution Date: 09/14/2017 Goal Status: Active Interventions: Assess fall risk on admission and as  needed Notes: Nutrition Nursing Diagnoses: Potential for alteratiion in Nutrition/Potential for imbalanced nutrition Goals: Patient/caregiver agrees to and verbalizes understanding of need to use nutritional supplements and/or vitamins as prescribed Date Initiated: 09/21/2016 Target Resolution Date: 09/14/2017 Goal Status: Active Interventions: Assess patient nutrition upon admission and as needed per policy Notes: Orientation to the Wound Care Program Nursing Diagnoses: Knowledge deficit related to the wound healing center program Goals: Patient/caregiver will verbalize understanding of the [place] Program Date Initiated: 09/21/2016 Target Resolution Date: 06/15/2017 Goal Status: Active Interventions: Provide education on orientation to the wound center Francisco, Waris E. ([id number]) Notes: Wound/Skin Impairment Nursing Diagnoses: Knowledge deficit related to smoking impact on wound healing Goals: Ulcer/skin breakdown will have a volume reduction of 30% by week 4 Date Initiated: 09/21/2016 Target Resolution Date: 08/17/2017 Goal Status: Active Ulcer/skin breakdown will have a volume reduction of 50% by week 8 Date Initiated: 09/21/2016 Target Resolution Date: 08/17/2017 Goal Status: Active Ulcer/skin breakdown will have a volume reduction of 80% by week 12 Date Initiated: 09/21/2016 Target Resolution Date: 09/14/2017 Goal Status: Active Ulcer/skin breakdown will heal within 14 weeks Date Initiated: 09/21/2016 Target Resolution Date: 08/17/2017 Goal Status: Active Interventions: Assess patient/caregiver ability to obtain necessary supplies Assess patient/caregiver ability to perform ulcer/skin care regimen upon admission and as needed Assess ulceration(s) every visit Notes: Electronic Signature(s) Signed: 08/14/2018 5:18:06 PM By: Gretta Cool, BSN, RN, CWS, Kim RN, BSN Entered By: Gretta Cool, BSN, RN, CWS, Kim on 08/14/2018 08:48:02 Abaya, [name]  ([id number]) -------------------------------------------------------------------------------- Pain Assessment Details Patient Name: Hauck, Jimmylee E. Date of Service: 08/14/2018 8:15 AM Medical Record Number: [id number] Patient Account Number: [id number] Date of Birth/Sex: 1950-08-05 (68 y.o. M) Treating RN: Cornell Barman Primary Care Mckell Riecke: Lamonte Sakai Other Clinician: Referring Ori Trejos: Lamonte Sakai Treating Vineta Carone/Extender: Melburn Hake, HOYT Weeks in Treatment: 66 Active Problems Location of Pain Severity and Description of Pain Patient Has Paino No Site Locations Pain Management and Medication Current Pain Management: Notes Patient denies pain at this time. Electronic Signature(s) Signed: 08/14/2018 5:18:06 PM By: Gretta Cool, BSN, RN, CWS, Kim RN, BSN Entered By: Gretta Cool, BSN, RN, CWS, Kim on 08/14/2018 08:29:58 [name] ([id number]) -------------------------------------------------------------------------------- Patient/Caregiver Education Details Patient Name: ELLISON, RIETH E. Date of Service: 08/14/2018 8:15 AM Medical Record Number: [id number] Patient Account Number: [id number] Date of Birth/Gender: 04-14-1950 (68 y.o. M) Treating RN: Cornell Barman Primary Care Physician: Lamonte Sakai Other Clinician: Referring Physician: Lamonte Sakai Treating Physician/Extender: Sharalyn Ink in Treatment: 40 Education Assessment Education Provided To: Patient Education Topics Provided Wound/Skin Impairment: Handouts: Caring for Your Ulcer Methods: Demonstration, Explain/Verbal Responses: State content correctly Electronic Signature(s) Signed: 08/14/2018 5:18:06 PM By: Gretta Cool,  BSN, RN, CWS, Kim RN, BSN Entered By: Gretta Cool, BSN, RN, CWS, Kim on 08/14/2018 08:56:33 Hammen, [name] ([id number]) -------------------------------------------------------------------------------- Wound Assessment Details Patient Name: Buscemi, Timohty E. Date of Service: 08/14/2018 8:15 AM Medical Record Number:  [id number] Patient Account Number: [id number] Date of Birth/Sex: [birth year] (68 y.o. M) Treating RN: Cornell Barman Primary Care Quintessa Simmerman: Lamonte Sakai Other Clinician: Referring Orva Gwaltney: Lamonte Sakai Treating Yuleimy Kretz/Extender: Melburn Hake, HOYT Weeks in Treatment: 2 Wound Status Wound Number: 16 Primary Diabetic Wound/Ulcer of the Lower Extremity Etiology: Wound Location: Right Toe Great - Plantar Wound Open Wounding Event: Not Known Status: Date Acquired: 10/29/2017 Comorbid Anemia, Lymphedema, Congestive Heart Weeks Of Treatment: 41 History: Failure, Hypertension, Peripheral Venous Clustered Wound: No Disease, Type II Diabetes, Neuropathy Photos Wound Measurements Length: (cm) 0.8 % Reduction in Width: (cm) 1 % Reduction in Depth: (cm) 0.1 Epithelializat Area: (cm) 0.628 Tunneling: Volume: (cm) 0.063 Undermining: Area: 60.8% Volume: 60.6% ion: Small (1-33%) No No Wound Description Classification: Grade 1 Foul Odor Afte Wound Margin: Flat and Intact Slough/Fibrino Exudate Amount: Medium Exudate Type: Serous Exudate Color: amber r Cleansing: No Yes Wound Bed Granulation Amount: Large (67-100%) Exposed Structure Granulation Quality: Pink Fascia Exposed: No Necrotic Amount: Small (1-33%) Fat Layer (Subcutaneous Tissue) Exposed: Yes Necrotic Quality: Adherent Slough Tendon Exposed: No Muscle Exposed: No Joint Exposed: No Bone Exposed: No Periwound Skin Texture Texture Color Kadow, Latoya E. ([id number]) No Abnormalities Noted: No No Abnormalities Noted: No Moisture No Abnormalities Noted: No Treatment Notes Wound #16 (Right, Plantar Toe Great) Notes alginate and coverlet Electronic Signature(s) Signed: 08/14/2018 5:18:06 PM By: Gretta Cool, BSN, RN, CWS, Kim RN, BSN Entered By: Gretta Cool, BSN, RN, CWS, Kim on 08/14/2018 08:38:39 Renstrom, [name] ([id number]) -------------------------------------------------------------------------------- Wound Assessment  Details Patient Name: Minjares, Vickey E. Date of Service: 08/14/2018 8:15 AM Medical Record Number: [id number] Patient Account Number: [id number] Date of Birth/Sex: 1950/09/12 (68 y.o. M) Treating RN: Cornell Barman Primary Care Terral Cooks: Lamonte Sakai Other Clinician: Referring Zaide Kardell: Lamonte Sakai Treating Neo Yepiz/Extender: Melburn Hake, HOYT Weeks in Treatment: 54 Wound Status Wound Number: 16 Primary Diabetic Wound/Ulcer of the Lower Extremity Etiology: Wound Location: Right, Plantar Toe Great Wound Open Wounding Event: Not Known Status: Date Acquired: 10/29/2017 Comorbid Anemia, Lymphedema, Congestive Heart Weeks Of Treatment: 41 History: Failure, Hypertension, Peripheral Venous Clustered Wound: No Disease, Type II Diabetes, Neuropathy Photos Wound Measurements Length: (cm) 0.4 % Reduction in Width: (cm) 0.3 % Reduction in Depth: (cm) 0.2 Epithelializat Area: (cm) 0.094 Tunneling: Volume: (cm) 0.019 Undermining: Area: 94.1% Volume: 88.1% ion: Small (1-33%) No No Wound Description Classification: Grade 1 Foul Odor Afte Wound Margin: Flat and Intact Slough/Fibrino Exudate Amount: Medium Exudate Type: Serous Exudate Color: amber r Cleansing: No Yes Wound Bed Granulation Amount: Large (67-100%) Exposed Structure Granulation Quality: Pink Fascia Exposed: No Necrotic Amount: Small (1-33%) Fat Layer (Subcutaneous Tissue) Exposed: Yes Necrotic Quality: Adherent Slough Tendon Exposed: No Muscle Exposed: No Joint Exposed: No Bone Exposed: No Periwound Skin Texture Texture Color Tsutsui, Dredyn E. ([id number]) No Abnormalities Noted: No No Abnormalities Noted: No Callus: No Atrophie Blanche: No Crepitus: No Cyanosis: No Excoriation: No Ecchymosis: No Induration: No Erythema: No Rash: No Hemosiderin Staining: No Scarring: Yes Mottled: No Pallor: No Moisture Rubor: No No Abnormalities Noted: No Dry / Scaly: No Temperature / Pain Maceration: No Temperature: No  Abnormality Treatment Notes Wound #16 (Right, Plantar Toe Great) Notes alginate and coverlet Electronic Signature(s) Signed: 08/14/2018 5:18:06 PM By: Gretta Cool, BSN, RN, CWS, Kim RN, BSN Entered By: Gretta Cool, BSN, RN, CWS, Kim on  08/14/2018 08:47:02 Helm, Edy E. ([id number]) -------------------------------------------------------------------------------- Wound Assessment Details Patient Name: Castiglia, Ismael E. Date of Service: 08/14/2018 8:15 AM Medical Record Number: [id number] Patient Account Number: [id number] Date of Birth/Sex: [birth year] (68 y.o. M) Treating RN: Cornell Barman Primary Care Clarke Peretz: Lamonte Sakai Other Clinician: Referring Tricha Ruggirello: Lamonte Sakai Treating Heinrich Fertig/Extender: Melburn Hake, HOYT Weeks in Treatment: 1 Wound Status Wound Number: 17 Primary Diabetic Wound/Ulcer of the Lower Extremity Etiology: Wound Location: Right, Distal, Plantar Metatarsal head second Wound Healed - Epithelialized Status: Wounding Event: Trauma Comorbid Anemia, Lymphedema, Congestive Heart Date Acquired: 02/03/2018 History: Failure, Hypertension, Peripheral Venous Weeks Of Treatment: 27 Disease, Type II Diabetes, Neuropathy Clustered Wound: No Photos Wound Measurements Length: (cm) 0 % R Width: (cm) 0 % R Depth: (cm) 0 Epi Area: (cm) 0 Tu Volume: (cm) 0 Un eduction in Area: 100% eduction in Volume: 100% thelialization: Large (67-100%) nneling: No dermining: No Wound Description Classification: Grade 1 Wound Margin: Flat and Intact Exudate Amount: None Present Foul Odor After Cleansing: No Slough/Fibrino No Wound Bed Granulation Amount: None Present (0%) Exposed Structure Necrotic Amount: None Present (0%) Fascia Exposed: No Fat Layer (Subcutaneous Tissue) Exposed: No Tendon Exposed: No Muscle Exposed: No Joint Exposed: No Bone Exposed: No Periwound Skin Texture Texture Color No Abnormalities Noted: No No Abnormalities Noted: No Luhn, Mildred E. ([id number]) Callus:  No Atrophie Blanche: No Crepitus: No Cyanosis: No Excoriation: No Ecchymosis: No Induration: No Erythema: No Rash: No Hemosiderin Staining: No Scarring: Yes Mottled: No Pallor: No Moisture Rubor: No No Abnormalities Noted: No Dry / Scaly: Yes Temperature / Pain Maceration: No Temperature: No Abnormality Electronic Signature(s) Signed: 08/14/2018 5:18:06 PM By: Gretta Cool, BSN, RN, CWS, Kim RN, BSN Entered By: Gretta Cool, BSN, RN, CWS, Kim on 08/14/2018 08:47:03 Bevard, [name] ([id number]) -------------------------------------------------------------------------------- Wound Assessment Details Patient Name: Oboyle, Jihan E. Date of Service: 08/14/2018 8:15 AM Medical Record Number: [id number] Patient Account Number: [id number] Date of Birth/Sex: Oct 10, 1950 (68 y.o. M) Treating RN: Cornell Barman Primary Care Eulalie Speights: Lamonte Sakai Other Clinician: Referring Jaymie Misch: Lamonte Sakai Treating Trentyn Boisclair/Extender: Melburn Hake, HOYT Weeks in Treatment: 45 Wound Status Wound Number: 18 Primary Diabetic Wound/Ulcer of the Lower Extremity Etiology: Wound Location: Right Toe Great - Dorsal Wound Open Wounding Event: Gradually Appeared Status: Date Acquired: 05/22/2018 Comorbid Anemia, Lymphedema, Congestive Heart Weeks Of Treatment: 12 History: Failure, Hypertension, Peripheral Venous Clustered Wound: No Disease, Type II Diabetes, Neuropathy Photos Wound Measurements Length: (cm) 0.5 % Reduction in Width: (cm) 1.2 % Reduction in Depth: (cm) 0.1 Epithelializat Area: (cm) 0.471 Tunneling: Volume: (cm) 0.047 Area: 41.2% Volume: 41.3% ion: Medium (34-66%) No Wound Description Classification: Grade 1 Foul Odor Afte Wound Margin: Flat and Intact Slough/Fibrino Exudate Amount: Medium Exudate Type: Serous Exudate Color: amber r Cleansing: No No Wound Bed Granulation Amount: Large (67-100%) Exposed Structure Granulation Quality: Pink Fascia Exposed: No Necrotic Amount: None Present  (0%) Fat Layer (Subcutaneous Tissue) Exposed: No Tendon Exposed: No Muscle Exposed: No Joint Exposed: No Bone Exposed: No Periwound Skin Texture Texture Color Saxer, Quadarius E. ([id number]) No Abnormalities Noted: No No Abnormalities Noted: No Callus: No Atrophie Blanche: No Crepitus: No Cyanosis: No Excoriation: No Ecchymosis: No Induration: No Erythema: No Rash: No Hemosiderin Staining: No Scarring: Yes Mottled: No Pallor: No Moisture Rubor: No No Abnormalities Noted: No Dry / Scaly: No Temperature / Pain Maceration: No Temperature: No Abnormality Treatment Notes Wound #18 (Right, Dorsal Toe Great) Notes alginate and coverlet Electronic Signature(s) Signed: 08/14/2018 5:18:06 PM By: Gretta Cool, BSN, RN, CWS, Kim RN, BSN Entered By:  Gretta Cool, BSN, RN, CWS, Kim on 08/14/2018 08:41:42 Alen, [name] ([id number]) -------------------------------------------------------------------------------- Wound Assessment Details Patient Name: Mickelson, JASMEET GEHL. Date of Service: 08/14/2018 8:15 AM Medical Record Number: [id number] Patient Account Number: [id number] Date of Birth/Sex: 05-Jan-1951 (68 y.o. M) Treating RN: Cornell Barman Primary Care Jasyah Theurer: Lamonte Sakai Other Clinician: Referring Lundyn Coste: Lamonte Sakai Treating Denson Niccoli/Extender: Melburn Hake, HOYT Weeks in Treatment: 62 Wound Status Wound Number: 2 Primary Diabetic Wound/Ulcer of the Lower Extremity Etiology: Wound Location: Right Toe Second Wound Open Wounding Event: Gradually Appeared Status: Date Acquired: 06/11/2016 Comorbid Anemia, Lymphedema, Congestive Heart Weeks Of Treatment: 98 History: Failure, Hypertension, Peripheral Venous Clustered Wound: No Disease, Type II Diabetes, Neuropathy Pending Amputation On Presentation Photos Wound Measurements Length: (cm) 0.1 % Reduction i Width: (cm) 0.1 % Reduction i Depth: (cm) 0.1 Epithelializa Area: (cm) 0.008 Tunneling: Volume: (cm) 0.001 Undermining: n Area: 99.7% n  Volume: 99.7% tion: Large (67-100%) No No Wound Description Classification: Grade 2 Foul Odor Aft Wound Margin: Indistinct, nonvisible Slough/Fibrin Exudate Amount: Small Exudate Type: Serous Exudate Color: amber er Cleansing: No o No Wound Bed Granulation Amount: Large (67-100%) Exposed Structure Granulation Quality: Pink Fascia Exposed: No Necrotic Amount: None Present (0%) Fat Layer (Subcutaneous Tissue) Exposed: No Tendon Exposed: No Muscle Exposed: No Joint Exposed: No Bone Exposed: No Limited to Skin Breakdown Hollenberg, Onyekachi E. ([id number]) Periwound Skin Texture Texture Color No Abnormalities Noted: No No Abnormalities Noted: No Callus: No Atrophie Blanche: No Crepitus: No Cyanosis: No Excoriation: No Ecchymosis: No Induration: No Erythema: No Rash: No Hemosiderin Staining: No Scarring: Yes Mottled: No Pallor: No Moisture Rubor: No No Abnormalities Noted: No Dry / Scaly: No Temperature / Pain Maceration: No Temperature: No Abnormality Treatment Notes Wound #2 (Right Toe Second) Notes alginate and coverlet Electronic Signature(s) Signed: 08/14/2018 5:18:06 PM By: Gretta Cool, BSN, RN, CWS, Kim RN, BSN Entered By: Gretta Cool, BSN, RN, CWS, Kim on 08/14/2018 08:47:49 Odonovan, [name] ([id number]) -------------------------------------------------------------------------------- Vitals Details Patient Name: Stinger, Lavaris E. Date of Service: 08/14/2018 8:15 AM Medical Record Number: [id number] Patient Account Number: [id number] Date of Birth/Sex: 1951-03-21 (68 y.o. M) Treating RN: Cornell Barman Primary Care Treon Kehl: Lamonte Sakai Other Clinician: Referring Chapin Arduini: Lamonte Sakai Treating Wael Maestas/Extender: Melburn Hake, HOYT Weeks in Treatment: 35 Vital Signs Time Taken: 08:30 Temperature (F): 98.2 Height (in): 69 Pulse (bpm): 71 Weight (lbs): 168 Respiratory Rate (breaths/min): 16 Body Mass Index (BMI): 24.8 Blood Pressure (mmHg): 171/78 Reference Range: 80 - 120 mg  / dl Notes Patient states he took BP meds this morning at 8am. Electronic Signature(s) Signed: 08/14/2018 5:18:06 PM By: Gretta Cool, BSN, RN, CWS, Kim RN, BSN Entered By: Gretta Cool, BSN, RN, CWS, Kim on 08/14/2018 08:32:46

## 2018-08-18 NOTE — Progress Notes (Signed)
GEVON, MARKUS ([id number]) Visit Report for 08/14/2018 Chief Complaint Document Details Patient Name: [name], [name] Date of Service: 08/14/2018 8:15 AM Medical Record Number: [id number] Patient Account Number: [id number] Date of Birth/Sex: [birth year] (68 y.o. M) Treating RN: Cornell Barman Primary Care Provider: Lamonte Sakai Other Clinician: Referring Provider: Lamonte Sakai Treating Provider/Extender: Melburn Hake, HOYT Weeks in Treatment: 81 Information Obtained from: Patient Chief Complaint Patient presents for treatment of an open diabetic ulcer to both feet Electronic Signature(s) Signed: 08/18/2018 9:44:32 AM By: Worthy Keeler PA-C Entered By: Worthy Keeler on 08/14/2018 08:44:32 Winiecki, [name] ([id number]) -------------------------------------------------------------------------------- HPI Details Patient Name: [name], [name] Date of Service: 08/14/2018 8:15 AM Medical Record Number: [id number] Patient Account Number: [id number] Date of Birth/Sex: May 19, 1950 (68 y.o. M) Treating RN: Cornell Barman Primary Care Provider: Lamonte Sakai Other Clinician: Referring Provider: Lamonte Sakai Treating Provider/Extender: Melburn Hake, HOYT Weeks in Treatment: 19 History of Present Illness HPI Description: 68 year old patient here to see as for bilateral feet ulceration to on his left first and second toe and 2 on his right first and second toe, which she's had for about 4 months. 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 Donella Stade. The patient is also undergoing workup by medical oncology for a intra-abdominal lymphadenopathy. 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 He is also status post appendectomy, inguinal lymph node biopsy, rectal biopsy and  rectal examination under anesthesia. he currently smokes cigarettes about half packet a day. In March of this year he was seen by Dr. Hortencia Pilar, for evaluation of bilateral varicose veins and besides wearing compression stockings he had recommended laser ablation of the right and left great saphenous veins to eleviate the symptoms and complications of severe superficial venous reflux disease. He also recommended lymphedema pumps for better control of his lymphedema. The patient recently has had on 08/23/2016, right greater saphenous vein ablation with the laser energy Earlier lower extremity venous reflux examination done on 05/08/2016 showed no DVT or SVT both lower legs but incompetence of bilateral great saphenous veins was present. A lower arterial study was also done and there was no significant right lower and left lower extremity problems based on a normal toe brachial index bilaterally and the ABI was 1.21 the left and 1.23 on the right. His post ablation venous duplex examination showed successful ablation of the right GS vein with thrombus formation 2 below the right saphenofemoral junction. The deep system was patent without evidence of thrombosis and this was done on 08/30/2016. 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 Addendum: regarding his x-rays done today and x-ray of the left foot -- IMPRESSION: No objective evidence of osteomyelitis. There are soft tissue changes which may reflect cellulitis. X-ray of the right foot -- IMPRESSION:Findings compatible with cellulitis of the toes. No objective evidence of osteomyelitis is observed. 10/01/16 on evaluation today patient's wounds appeared to be doing some better. I did review the x-rays as well which showed no evidence of osteomyelitis although there was evidence on x-ray of cellulitis. He fortunately is not having  any discomfort although he continues to have some swelling. He does not remember being on any antibiotics  recently. 10/15/16 on evaluation today patient's wounds overall appear to be doing better although he does have a new location noted on the left foot. Fortunately he is not having significant pain. It almost has the appearance that something is rubbing on the end of his toes but he wears the open toe shoes and according to what he is telling me never wears anything that would rub on his foot. There is no evidence of infection and specifically no evidence of a fungal infection 10/22/16 On evaluation today patient's wounds appeared to be doing better compared to last week in regard to his bilateral lower extremities. Fortunately I happy with how things are progressing although he still has ulcers I feel like that he is improving and appropriate manner. 11/12/16 on evaluation today patient appears to be doing well in regard to his bilateral feet and the respective wounds. We have been using surrounding her dressings along with an antifungal cream which seems to be doing very well. He has no bilateral dysfunction noticed that the rituals are weight loss at this point. He also has no nausea or vomiting a note purulent discharge. He did see Vein and vascular today and he tells me that they told him he could have surgery for his venous stasis but they did not feel like it was worth it in his words. Fortunately patient's wounds do appear to be getting sneakily better. [name], [name] ([id number]) 11/26/2016 -- he says he is going to have some surgery during this week at [facility] for possibly a colon resection. 12/31/2016 -- the patient has been noncompliant with his smoking and I'm not sure whether he is also started drinking again. He continues to be very nonchalant about his care 01/14/2017 -- the patient's HandP has been reviewed well and I understand he is being compliant with trying to give up  smoking and his local dressing changes. He does not have any surgical options of 4 to him by his vascular surgeons.he was last seen in early August by Dr. Hortencia Pilar who recommended compression stockings,and possibly lymph pumps in 2-3 months after doing a review ultrasound. 01/28/2017 - the patient did not have any fresh complaints but on examination I noted a large lacerated wound on the plantar aspect of his right fourth toe which had a lot of necrotic debris and it probes down to bone. 02/07/2017 -- x-ray of the right foot -- IMPRESSION: Soft tissue swelling about the first through fourth toes consistent with cellulitis. New destructive change in the tuft of the distal phalanx of the great toe is consistent with osteomyelitis. 02/14/2017 -- the patient's MRI is pending this coming Monday and he still continues to smoke. We have again gone over off loading of his wounds in great detail and he says he's been compliant. 02/21/2017 -- MR of the right foot -- IMPRESSION: 1. Soft tissue ulcer at the tip of the first, second and third toe knows. Cortical irregularity and bone marrow edema in the first distal phalanx most concerning for osteomyelitis. Mild marrow edema in the second and third distal phalanx without definite cortical destruction which may reflect early osteomyelitis versus reactive marrow edema. 2. Soft tissue edema surrounding the first phalanx most consistent with cellulitis. the patient was also recently evaluated by his medical oncologist Dr. 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 She is treating him with weekly Procrit. She is also keeping intra-abdominal lymphadenopathy and right lower  lobe lung nodule under observation. 04/04/2017 -- he was seen by Dr. Adrian Prows on 03/25/2017 -- after review he empirically put him on ciprofloxacin and doxycycline as they have good bone penetration and good  bioavailability and it will cover the usual pathogens and diabetic foot osteomyelitis. He will check inflammatory markers and plan a 67-89 week old records. C-reactive protein was 0.3 and the ESR was 72 04/18/17 on evaluation today patient appears to be doing about the same in regard to his lower extremity wounds bilaterally. He has continued to use the antifungal cream which does seem to be beneficial. Nonetheless the ulcers do seem to in some areas be epithelial eyes over and in other areas are still open. He is having no significant discomfort. 04/25/17-he is here in follow-up evaluation for multiple ulcerations to multiple toes bilaterally. He states he did see Dr. Ola Spurr again last week and continues antibiotic therapy. He is voicing no complaints or concerns, will continue with current treatment plan will possibility of adding compression therapy next week after an additional week of treatment/lotions to BLE prescribed by Dr Ola Spurr 05/02/17 he is here in follow up for for multiple ulcers to multiple toes bilaterally. we will stop using antifungal cream and will continue with silvercel and follow up next week 05/09/17-he is here in follow-up for multiple ulcerations to multiple toes bilaterally. There is improvement in appearance. He has not completely stopped using antifungal cream, but admits he has not using it between the toes. He has an appointment with Dr. Ola Spurr on 2/11, continues on doxycycline and Cipro. It has been 5 weeks of antibiotic therapy, we will order plain film xray to evaluate for osteomyelitis next week, prior to follow up with ID. Will continue with silvercel and follow up next week 05/16/17-he is here in follow-up evaluation for multiple ulcerations to multiple toes bilaterally and new wound to the right posterior heel. There is essentially no change in appearance, deteriorating measurements; he has a history of waxing and waning measurements. He admits that he  continues to apply moisturizer/cream/ointment to his toes despite weekly reminders to only apply silvercel to his toes. He states that he thinks the surgical shoe contributed to the superficial ulcer to his posterior heel, he is unable to articulate if this was an area of dry cracked skin as he has a similar area to the left heel. He now is wearing open toed slippers. He has an appointment with Dr. Ola Spurr on 2/11. We have ordered x-rays for her bilateral feet; he was advised to obtain the x-rays today or tomorrow. He will follow-up next week 05/23/17-he is here in follow-up evaluation for multiple ulcerations to multiple toes bilaterally and the right posterior heel. There is improvement in maceration. He has been compliant and not applying any moisturizing agent to his toes. He has been using Lac-Hydrin for his lower extremities with improvement. He did not go to his appointment on Monday with Dr. Ola Spurr secondary to financial concerns. X-rays for her bilateral feet showed: LEFT FOOT with slight erosion of the tuft of the distal phalanges of the left first and second toe suspicious for osteomyelitis, RIGHT FOOT with 1.erosion of the tufts of the distal Lachapelle, [name] ([id number]) phalanges of the right first second and possibly third toes consistent with osteomyelitis, 2 no definitive abnormality of the calcaneus is seen on the images obtained, 3. Plantar calcaneal degenerative spur. We briefly discussed hyperbaric adjunctive therapy for treatment of chronic refractory osteomyelitis. I do not find an a1c in  EMR, will contact PCP for record, or order if needed. He has been encouraged to contact Dr Ola Spurr office regarding the follow-up appointment, encouraged him to inquire about payment plan. We will continue with same treatment plan and follow-up next week. He states he is still taking antibiotics and has "a lot" left. He states he has been taking them as directed, 2 pills twice daily.  According to Dr. Blane Ohara office notes he was originally started on 12/17 for 4 weeks and extended on 1/14 for an additional 4 weeks. He should be done with his antibiotic therapy, he was advised to bring his bottles and to his next appointment, we will contact pharmacy. 05/30/17-he is here in follow-up evaluation for multiple ulcerations to multiple toes bilaterally and the right posterior heel. He is accompanied by his brother-in-law. Wounds are stable. He has yet to make up with Dr. Ola Spurr. We contacted his PCP, with no record of recent A1c we will draw an A1c. His brother-in-law states that he was taken off all of his diabetic medication secondary to kidney function. 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 The antibiotics were counted and have approximately 2 weeks left, although they should be complete. The pill organizer reveals missing days. We discussed the need for consistent medications, to have optimal benefit of medication. He has a cousin that lives with him and he will ask her to check his organizer daily. He has been advised to follow up with Dr Ola Spurr, and will go by the office today. He has been advised to quit smoking. 06/06/17-he is here in follow up evaluation. He has had to make an appointment with Dr. Ola Spurr. He did have blood work obtained, a1c 5. He continues to take antibiotic therapy. Significant improvement in bilateral lower extremity edema with compression therapy. Essentially no change in ulcerations to toes. He states he is "going to try something different" and "let me know next week" if it works; he would not provide any additional information and was encouraged to follow our orders. We will follow up next week 06/13/17-he is here in follow-up evaluation. He has an appointment with Dr. Ola Spurr tomorrow morning. He states he purchased an ointment from  [facility] and applied to his toes for 3 days, he does not remember the name of the ointment. There is improvement to his wounds, minimal maceration. He continues to take antibiotic therapy, this should have been completed last month. His brother-in-law who regularly accompanies his appointments was asked to take the bottles to the appointment tomorrow with Dr. Ola Spurr so he is aware. We will continue with 3 layer compression, and order OPEN TOE compression 20-30mmHg; we will apply compression stockings next week. He continues to smoke, smoked "2 cigarettes" last week 06/20/17 on evaluation today patient did receive his compression stockings which he has with him today for both lower extremities. With that being said he tells me at this point in time that he is very happy to have these he really is not a big fan of the compression wraps that we have been utilizing although they have been of great benefit for him. Nonetheless at this point he does want to switch to the compression stockings. In my opinion as long as he is continuing with compression I'm okay with the stockings or the wraps. 06/27/17-he is here in follow-up evaluation for multiple ulcerations to his bilateral toes. There is some improvement in appearance. He is compliant  in wearing his compression stockings with significant improvement in lower extremity edema. He saw Dr Ola Spurr on 3/8, per his notes they would redraw ESR and CRP; plan to continue antibiotic therapy if these remain elevated. I do not see an ESR or CRP level in Epic. The patient continues to take antibiotics. 07/11/17-He is here in follow-up evaluation for multiple ulcerations to multiple toes bilaterally. He presents with complete epithelialization to the right third toe; there has been no deterioration. He continues on antibiotic therapy. He will follow-up next week 07/18/17-He is here in follow-up evaluation for multiple ulcerations to multiple toes bilaterally. He  continues to make improvement. He continues on antibiotic therapy. He states he has been using something additional to our orders, he does not elaborate but states he will bring it in next week. 07/25/17-He is here in follow up evaluation for multiple ulcerations to bilateral toes. He is stable. He has completed antibiotic therapy. He admits to "filing" his toes after showers each evening, this is what he was referencing last week; he does not filing for the wounds. We will switch to [facility] and monitor for any improvement, he will follow-up next week 08/01/17-He is here in follow-up evaluation. He admits to "picking" at his toes after cleansing yesterday, leading to new areas of tissue loss on the bilateral second toe. There is improvement noted to the bilateral great toe. We will dress toes today and hope that they maintain until Monday where he will come in for a nurse visit. He has been advised, multiple times with expressed verbalization, to change the dressings to silvercel if the dressings get wet prior to [facility] appointment. 08/08/17-He is here in follow-up evaluation for bilateral first and second toe ulcerations. There is significant improvement to all ulcerations since last visit. We will switch to [name] to all wounds and he will continue with nurse visits on a Monday/Thursday schedule and follow-up with me in 2 weeks. He continues to smoke, 1-3 cigarettes per day, and has been encouraged to not smoke until his next follow-up in 2 weeks. 08/15/17 on evaluation today patient's ulcers on his toes actually appear to be doing fairly well the right to ulcers may be a little bit more moist compared to the left I'm not really sure exactly why as the openings appear to be very small and I'm not seeing any evidence of anything significant as far as you lightest or otherwise. Nonetheless this may just be a small setback he's [name], [name] ([id number]) been doing very well with the  [facility] Dressing 08/22/17-He is here in follow-up evaluation for ulcerations to his bilateral first and second toes, there is small/scant amount of drainage noted on today's dressing. He continues with Hydrofera Blue. He continues to smoke, 1 cigarette a day. Voices no complaint or concerns, compliant and compression therapy today. He'll follow-up next week 08/29/17-He is here in follow-up evaluation for ulcerations to his bilateral first and second toes, with a new wound to the left third toe. He states he cut himself while cutting the toenail. He presents today with more maceration and increased measurements; waxing and waning measurements and moisture has been an ongoing issue. He continues to smoke a proximally 1 cigarette per day, but states he has not smoked since Sunday. We will initiate medihoney daily and evaluate next week. He presents today without compression stockings 09/05/17-He is here in follow-up evaluation for ulcerations to bilateral first and second and left third toe. He did not pick up the  medihoney and therefore has not been changing his dressing. He admits to inconsistent where of compression stockings, admits to pain to his bilateral lower extremities with unilateral edema (right greater than left). The ulcerations to multiple toes are more macerated and larger in size than last week. He is wearing compression therapy today. He admits to an overall feeling of weakness and fatigue and has been encouraged to contact his PCP for evaluation; he has a known history of anemia and intraabdominal lymphadenopathy. We will return to silvercel and he will follow up next week 09/19/17-He is here in follow-up evaluation for ulcerations to bilateral first and second toe.he was unable to make last week's appointment. The culture that was obtained on 5/30 grew clindamycin sensitive MRSA; he was initiated on 6/4 but did not start it until 6/7. There is significant improvement in all  wounds, healing to left second and third toe. He also admits to having no alcohol or smoking for the last 6 days. We will continue with same treatment plan I will extend the clindamycin for an additional 10 days and he will follow-up next week. 09/26/17- He is here in follow evaluation for multiple ulcerations to multiple toes bilaterally. He continues to be non-compliant with dressing, compression therapy. He states he is taking the antibiotics as prescribed, although his recall is inconsistent. There is noted deterioration in all ulcers. We will continue same treatment plan, antibiotic therapy and he will follow up next week. 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 This information is obtained from the medical record, as he cannot articulate specifics to his diagnosis or treatment plan. 10/17/17-He is here in follow-up evaluation for multiple ulcerations to multiple toes bilaterally. There is chronic waxing and waning improvement/deterioration. On today's exam they appear improved, dry without maceration. He continues to apply a variety of topical agents, stating that he has switched between the blue product and silver product. In my opinion, this will be a chronic waxing and waning giving his intermittent compliance. He will follow-up in 2 weeks. As it relates to the biopsy taken from his buttock, he has not followed up with that and has not started radiation therapy. 10/31/17-He is here for evaluation for multiple ulcerations to multiple toes bilaterally. Continues with waxing and waning improvement versus deterioration. They are stable on today's exam without significant maceration. 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 We will continue with every 2 week follow-up appointments 11/14/17- He  is seen in follow-up evaluation for multiple ulcerations to multiple bilateral toes. He is scheduled for excision of basal cell carcinoma to his right buttock on Monday 8/12. He continues to have chronic waxing and waning of his ulcers; today is a stable day with minimal drainage and left ear is better than right toes. I will recount to infectious disease regarding chronic suppressive therapy. He admits to being more compliant with compression stockings, although he is not wearing any today. He has been encouraged to continue with dressing changes as ordered and compression therapy daily. He will be seen in 2 weeks 11/28/17-He is seen in follow up evaluation for multiple ulcerations to multiple toes bilaterally. He had an overnight admission at [facility] (8/11-8/12) for symptomatic anemia, received two units prbc. His surgical date has been moved to 9/94 excision of basal cell carcinoma to  the right buttock. He has been noncompliant in wearing compression stockings, applying topical treatment as ordered; he has been applying a "cream", Hydrofera Blue, silvercel. He is currently on no antibiotic therapy; we will send for re-referral to ID for suppressive therapy given his multiple co-morbidities, poor surgical candidate, unwilling to have toe amputation and general non-adherence. 12/19/17 on evaluation today patient actually appears to be doing very well in regard to his toes at least compared to last time I saw him. He again has been coming to our office for quite a bit of time. 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 He states is hurting a little bit but doing well in general. I'm see were not taking care of this area dermatology is. He does have his appointment with infectious disease upcoming which they re-consult for suppressive therapy that's next Monday. 01/02/18 on evaluation  today patient actually appears to be doing rather well in regard to his toe ulcers although he tells me he has been put in an ointment on this which I am concerned may be causing some additional moisture buildup which we really do not want. He supposed be using silver cell which is mainly thing I want him to be utilizing. Fortunately there does not appear to be any evidence of worsening infection which is good news. He has had surgery on his gluteal region where he had an excision of a cancerous area fortunately that seems to be doing well although he is having some discomfort. 02/06/18 on evaluation today patient actually appears to be doing well at all locations except for his second toe use bilaterally Sinor, Dunbar E. ([id number]) where the distal tip is still wrong open. He did state that he can't stand for dry skin to buildup on his toes subsequently as a question and further it sounds as if he actually pills this all which is likely keeping the toe is wrong and open. Also think that's what happened in the bottom of his foot although that appears to have healed at this point. There does not appear to be any evidence of infection. 02/20/18 on evaluation today patient appears to be doing better in regard to his foot ulcers. Everything has shown signs of improvement which is excellent news. Overall I'm very pleased with how things appear currently. The patient states he's not picking her point at any skin and that he wears shoes at all times when he is ambulating at home. Both are things that I've advocated and recommended for him in the past. 03/13/18 on evaluation today patient actually appears to be doing very well in regard to the bilateral toes in the overall appearance of the wound bed locations. Fortunately there does not appear to be any signs of infection at this time. He states that he is very pleased with how things seem to be progressing to be honest as am I. 03/27/1899 evaluation today  patient appears to be doing well in regard to his toe ulcers. He just has a few areas remaining and these appear to be showing signs of improvement as well which is great news. Overall I have been impressed with the progress of the past several weeks. 04/10/18 on evaluation today patient appears to be doing a little bit worse in regard to the right plantar foot at the regional the first metatarsal as well as the left second toe. He tells me that  he ran out of bandages he also tells me he's been wearing his flip-flops a lot at home as well as a new pair of boots that he received as well. This is as opposed to the postop shoes that we had previously given him. Nonetheless overall I feel like that he has had a little bit more friction to the area which is causing things to look a little bit worse today. 04/24/18 on evaluation today patient's wounds for the most part appear to be doing excellent especially in regard to the bilateral second toes. With that being said he does have issues with the wound still on the first metatarsal region and first toe of the right foot which have not completely closed at this point. Nonetheless I do feel like that he is making progress which is good news. 05/08/18 on evaluation today patient's wounds appear to be doing just a little bit worse than normal simply due to the fact that he has not had dressing supplies. He apparently owed $35 in order for them to be shipped and unfortunately is not able to pay that currently. We did give him information for the [employer] today which will hopefully help cover his dressing supplies. He was thankful for this he states is gonna contact them this afternoon. Fortunately otherwise or does not hear any signs of infection. 05/22/18 on evaluation today patient appears to be doing somewhat poorly in regard to his toes. Upon further questioning it appears that he actually has been pulling skin off of the toes which "drive  him crazy". Subsequently as he does this he calls his new areas to tear open and I think this is one of the reasons why his toes are not healing as appropriately as we would like. He fortunately has no signs of infection at this time. With that being said I'm still concerned about the possibility of a fungal infection even though there doesn't appear to be anything bacterial. Potentially nystatin powder could be of benefit for him as far as trying to help this in that regard. 06/05/18 on evaluation today patient actually appears to be doing very well in regard to his foot ulcers. Fortunately there's no signs of infection. He's been tolerating the dressing changes without complication. I'm very pleased with how things seem to be progressing. Fortunately there's no evidence of active infection at this point I do believe nystatin powder has been of benefit for him. 06/19/18 on evaluation today patient actually appears to be doing a little worse compared to last evaluation. Tells me that he has not had any supplies for two weeks. With that being said his plantar foot wounds on the right actually appear to be doing very well. It's actually a second toes in the first toes that are the worst. I think these are areas the was able to get to and pick and he tells me in fact that he destine to pick out some of the dry skin because it causes him annoyance. Nonetheless I think this is somewhat detrimental to him I think it's worth keeping part of the wound open in particular. Fortunately there is no sign of infection at this time although I think that is a risk which I discussed specifically that he's not careful you can end up reopening this and causing greater problems for himself. 07/03/18 on evaluation today patient's wound bed actually shows evidence of good granulation at this time and epithelialization as far as the wounds in general are concerned. He  has been tolerating the dressing changes including the  nystatin without complication. This is good news. With that being said he seems to be still having some trouble with the left second toe distally at this point. 4/9; most of the wounds look quite satisfactory. He requires debridement of the plantar right great toe otherwise everything [name], [name] ([id number]) looks superficial to improved. His left second toe is healed. 08/14/18 on evaluation today patient actually appears to be doing very well in regard to his foot ulcerations. In fact he just has three small areas of the right foot still remaining open the left foot is closed and dry at this point. Overall I feel like he is doing quite well which is excellent news. Fortunately there's no signs of active infection. Electronic Signature(s) Signed: 08/18/2018 9:44:32 AM By: Worthy Keeler PA-C Entered By: Worthy Keeler on 08/14/2018 08:51:52 [place], [name] ([id number]) -------------------------------------------------------------------------------- Physical Exam Details Patient Name: Wadle, Erasmus E. Date of Service: 08/14/2018 8:15 AM Medical Record Number: [id number] Patient Account Number: [id number] Date of Birth/Sex: 1951/03/31 (68 y.o. M) Treating RN: Cornell Barman Primary Care Provider: Lamonte Sakai Other Clinician: Referring Provider: Lamonte Sakai Treating Provider/Extender: Melburn Hake, HOYT Weeks in Treatment: 67 Constitutional Well-nourished and well-hydrated in no acute distress. Respiratory normal breathing without difficulty. clear to auscultation bilaterally. Cardiovascular regular rate and rhythm with normal S1, S2. Psychiatric this patient is able to make decisions and demonstrates good insight into disease process. Alert and Oriented x 3. pleasant and cooperative. Notes Patient's wound bed currently shows evidence of good granulation at this time. There does not appear to be any evidence of again infection he seems to be healing quite nicely and overall very pleased with  the progress that is made up till today. Electronic Signature(s) Signed: 08/18/2018 9:44:32 AM By: Worthy Keeler PA-C Entered By: Worthy Keeler on 08/14/2018 08:52:31 [name], [name] ([id number]) -------------------------------------------------------------------------------- Physician Orders Details Patient Name: Hao, Shaheed E. Date of Service: 08/14/2018 8:15 AM Medical Record Number: [id number] Patient Account Number: [id number] Date of Birth/Sex: 11/04/1950 (68 y.o. M) Treating RN: Cornell Barman Primary Care Provider: Lamonte Sakai Other Clinician: Referring Provider: Lamonte Sakai Treating Provider/Extender: Melburn Hake, HOYT Weeks in Treatment: 52 Verbal / Phone Orders: No Diagnosis Coding ICD-10 Coding Code Description L97.521 Non-pressure chronic ulcer of other part of left foot limited to breakdown of skin L97.511 Non-pressure chronic ulcer of other part of right foot limited to breakdown of skin E11.621 Type 2 diabetes mellitus with foot ulcer I87.323 Chronic venous hypertension (idiopathic) with inflammation of bilateral lower extremity I89.0 Lymphedema, not elsewhere classified F17.218 Nicotine dependence, cigarettes, 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 abuse 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, right ankle and foot Wound Cleansing Wound #16 Right,Plantar Toe Great o Clean wound with Normal Saline. Wound #18 Right,Dorsal Toe Great o Clean wound with Normal Saline. Wound #2 Right Toe Second o Clean wound with Normal Saline. Anesthetic (add to Medication List) Wound #16 Right,Plantar Toe Great o Topical Lidocaine 4% cream applied to wound bed prior to debridement (In Clinic Only). Wound #18 Right,Dorsal Toe Great o Topical Lidocaine 4% cream applied to wound bed prior to debridement (In Clinic Only). Wound #2 Right Toe Second o Topical Lidocaine 4% cream applied to wound bed prior to debridement (In  Clinic Only). Skin Barriers/Peri-Wound Care Wound #16 Right,Plantar Toe Great o Antifungal powder-Nystatin Wound #18 Right,Dorsal Toe Great o Antifungal powder-Nystatin Wound #2 Right Toe Second o Antifungal powder-Nystatin [name], [name] (  [id number]) Primary Wound Dressing Wound #16 Right,Plantar Toe Great o Silver Alginate - secure with tape Wound #18 Right,Dorsal Toe Great o Silver Alginate - secure with tape Wound #2 Right Toe Second o Silver Alginate - secure with tape Dressing Change Frequency Wound #16 Right,Plantar Toe Great o Change dressing every other day. Wound #18 Right,Dorsal Toe Great o Change dressing every other day. Wound #2 Right Toe Second o Change dressing every other day. Follow-up Appointments Wound #16 Right,Plantar Toe Great o Return Appointment in 2 weeks. Wound #18 Right,Dorsal Toe Great o Return Appointment in 2 weeks. Wound #2 Right Toe Second o Return Appointment in 2 weeks. Edema Control Wound #16 Right,Plantar Toe Great o Patient to wear own compression stockings Wound #18 Right,Dorsal Toe Great o Patient to wear own compression stockings Wound #2 Right Toe Second o Patient to wear own compression stockings Additional Orders / Instructions Wound #16 Right,Plantar Toe Great o Stop Smoking o Increase protein intake. Wound #18 Right,Dorsal Toe Great o Stop Smoking o Increase protein intake. Wound #2 Right Toe Second o Stop Smoking o Increase protein intake. Medications-please add to medication list. Wound #16 [address] KRYSTLE, OBERMAN ([id number]) o Other: - Nystatin Powder Wound #18 Right,Dorsal Toe Great o Other: - Nystatin Powder Wound #2 Right Toe Second o Other: - Nystatin Powder Electronic Signature(s) Signed: 08/14/2018 5:18:06 PM By: Gretta Cool, BSN, RN, CWS, Kim RN, BSN Signed: 08/18/2018 9:44:32 AM By: Worthy Keeler PA-C Entered By: Gretta Cool, BSN, RN, CWS, Kim on  08/14/2018 08:54:28 [name], [name] ([id number]) -------------------------------------------------------------------------------- Problem List Details Patient Name: Slee, Laray E. Date of Service: 08/14/2018 8:15 AM Medical Record Number: [id number] Patient Account Number: [id number] Date of Birth/Sex: 04-25-1950 (68 y.o. M) Treating RN: Cornell Barman Primary Care Provider: Lamonte Sakai Other Clinician: Referring Provider: Lamonte Sakai Treating Provider/Extender: Melburn Hake, HOYT Weeks in Treatment: 41 Active Problems ICD-10 Evaluated Encounter Code Description Active Date Today Diagnosis L97.521 Non-pressure chronic ulcer of other part of left foot limited to 09/21/2016 No Yes breakdown of skin L97.511 Non-pressure chronic ulcer of other part of right foot limited to 09/21/2016 No Yes breakdown of skin E11.621 Type 2 diabetes mellitus with foot ulcer 09/21/2016 No Yes I87.323 Chronic venous hypertension (idiopathic) with inflammation of 09/21/2016 No Yes bilateral lower extremity I89.0 Lymphedema, not elsewhere classified 09/21/2016 No Yes F17.218 Nicotine dependence, cigarettes, with other nicotine-induced 09/21/2016 No Yes disorders F10.19 Alcohol abuse with unspecified alcohol-induced disorder 09/21/2016 No Yes M86.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, right ankle and foot 02/21/2017 No Yes Inactive Problems Resolved Problems [name], [name] ([id number]) Electronic Signature(s) Signed: 08/18/2018 9:44:32 AM By: Worthy Keeler PA-C Entered By: Worthy Keeler on 08/14/2018 08:44:24 [name], [name] ([id number]) -------------------------------------------------------------------------------- Progress Note Details Patient Name: Vita, Oda E. Date of Service: 08/14/2018 8:15 AM Medical Record Number: [id number] Patient Account Number: [id number] Date of Birth/Sex: Apr 15, 1950 (68 y.o. M) Treating RN: Cornell Barman Primary Care Provider: Lamonte Sakai Other Clinician: Referring Provider: Lamonte Sakai Treating Provider/Extender: Melburn Hake, HOYT Weeks in Treatment: 79 Subjective Chief Complaint Information obtained from Patient Patient presents for treatment of an open diabetic ulcer to both feet History of Present Illness (HPI) 68 year old patient here to see as for bilateral feet ulceration to on his left first and second toe and 2 on his right first and second toe, which she's had for about 4 months. 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 Donella Stade. The patient is also undergoing workup by medical oncology for a intra-abdominal lymphadenopathy. 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 He is also status post appendectomy, inguinal lymph node biopsy, rectal biopsy and rectal examination under anesthesia. he currently smokes cigarettes about half packet a day. In March of this year he was seen by Dr. Hortencia Pilar, for evaluation of bilateral varicose veins and besides wearing compression stockings he had recommended laser ablation of the right and left great saphenous veins to eleviate the symptoms and complications of severe superficial venous reflux disease. He also recommended lymphedema pumps for better control of his lymphedema. The patient recently has had on 08/23/2016, right greater saphenous vein ablation with the laser energy Earlier lower extremity venous reflux examination done on 05/08/2016 showed no DVT or SVT both lower legs but incompetence of bilateral great saphenous veins was present. A lower arterial study was also done and there was no significant right lower and left lower extremity problems based on a normal toe brachial index bilaterally and the ABI was 1.21 the left and 1.23 on the right. His post ablation  venous duplex examination showed successful ablation of the right GS vein with thrombus formation 2 below the right saphenofemoral junction. The deep system was patent without evidence of thrombosis and this was done on 08/30/2016. 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 Addendum: regarding his x-rays done today and x-ray of the left foot -- IMPRESSION: No objective evidence of osteomyelitis. There are soft tissue changes which may reflect cellulitis. X-ray of the right foot -- IMPRESSION:Findings compatible with cellulitis of the toes. No objective evidence of osteomyelitis is observed. 10/01/16 on evaluation today patient's wounds appeared to be doing some better. I did review the x-rays as well which showed no evidence of osteomyelitis although there was evidence on x-ray of cellulitis. He fortunately is not having any discomfort although he continues to have some swelling. He does not remember being on any antibiotics recently. 10/15/16 on evaluation today patient's wounds overall appear to be doing better although he does have a new location noted on the left foot. Fortunately he is not having significant pain. It almost has the appearance that something is rubbing on the end of his toes but he wears the open toe shoes and according to what he is telling me never wears anything that would rub on his foot. There is no evidence of infection and specifically no evidence of a fungal infection 10/22/16 On evaluation today patient's wounds appeared to be doing better compared to last week in regard to his bilateral lower extremities. Fortunately I happy with how things are progressing although he still has ulcers I feel like that he is improving and appropriate manner. DEZMEN, ALCOCK ([id number]) 11/12/16 on evaluation today patient appears to be doing well in regard to his bilateral feet and  the respective wounds. We have been using surrounding her dressings along with an antifungal cream which seems to be doing very well. He has no bilateral dysfunction noticed that the rituals are weight loss at this point. He also has no nausea or vomiting a note purulent discharge. He did see Vein and vascular today and he tells me that they told him he could have surgery for his  venous stasis but they did not feel like it was worth it in his words. Fortunately patient's wounds do appear to be getting sneakily better. 11/26/2016 -- he says he is going to have some surgery during this week at [facility] for possibly a colon resection. 12/31/2016 -- the patient has been noncompliant with his smoking and I'm not sure whether he is also started drinking again. He continues to be very nonchalant about his care 01/14/2017 -- the patient's HandP has been reviewed well and I understand he is being compliant with trying to give up smoking and his local dressing changes. He does not have any surgical options of 4 to him by his vascular surgeons.he was last seen in early August by Dr. Hortencia Pilar who recommended compression stockings,and possibly lymph pumps in 2-3 months after doing a review ultrasound. 01/28/2017 - the patient did not have any fresh complaints but on examination I noted a large lacerated wound on the plantar aspect of his right fourth toe which had a lot of necrotic debris and it probes down to bone. 02/07/2017 -- x-ray of the right foot -- IMPRESSION: Soft tissue swelling about the first through fourth toes consistent with cellulitis. New destructive change in the tuft of the distal phalanx of the great toe is consistent with osteomyelitis. 02/14/2017 -- the patient's MRI is pending this coming Monday and he still continues to smoke. We have again gone over off loading of his wounds in great detail and he says he's been compliant. 02/21/2017 -- MR of the right foot -- IMPRESSION: 1.  Soft tissue ulcer at the tip of the first, second and third toe knows. Cortical irregularity and bone marrow edema in the first distal phalanx most concerning for osteomyelitis. Mild marrow edema in the second and third distal phalanx without definite cortical destruction which may reflect early osteomyelitis versus reactive marrow edema. 2. Soft tissue edema surrounding the first phalanx most consistent with cellulitis. the patient was also recently evaluated by his medical oncologist Dr. 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 She is treating him with weekly Procrit. She is also keeping intra-abdominal lymphadenopathy and right lower lobe lung nodule under observation. 04/04/2017 -- he was seen by Dr. Adrian Prows on 03/25/2017 -- after review he empirically put him on ciprofloxacin and doxycycline as they have good bone penetration and good bioavailability and it will cover the usual pathogens and diabetic foot osteomyelitis. He will check inflammatory markers and plan a 91-74 week old records. C-reactive protein was 0.3 and the ESR was 72 04/18/17 on evaluation today patient appears to be doing about the same in regard to his lower extremity wounds bilaterally. He has continued to use the antifungal cream which does seem to be beneficial. Nonetheless the ulcers do seem to in some areas be epithelial eyes over and in other areas are still open. He is having no significant discomfort. 04/25/17-he is here in follow-up evaluation for multiple ulcerations to multiple toes bilaterally. He states he did see Dr. Ola Spurr again last week and continues antibiotic therapy. He is voicing no complaints or concerns, will continue with current treatment plan will possibility of adding compression therapy next week after an additional week of treatment/lotions to BLE prescribed by Dr Ola Spurr 05/02/17 he is here in follow up for for  multiple ulcers to multiple toes bilaterally. we will stop using antifungal cream and will continue with silvercel and follow up next week  05/09/17-he is here in follow-up for multiple ulcerations to multiple toes bilaterally. There is improvement in appearance. He has not completely stopped using antifungal cream, but admits he has not using it between the toes. He has an appointment with Dr. Ola Spurr on 2/11, continues on doxycycline and Cipro. It has been 5 weeks of antibiotic therapy, we will order plain film xray to evaluate for osteomyelitis next week, prior to follow up with ID. Will continue with silvercel and follow up next week 05/16/17-he is here in follow-up evaluation for multiple ulcerations to multiple toes bilaterally and new wound to the right posterior heel. There is essentially no change in appearance, deteriorating measurements; he has a history of waxing and waning measurements. He admits that he continues to apply moisturizer/cream/ointment to his toes despite weekly reminders to only apply silvercel to his toes. He states that he thinks the surgical shoe contributed to the superficial ulcer to his posterior heel, he is unable to articulate if this was an area of dry cracked skin as he has a similar area to the left heel. He now is wearing open toed slippers. He has an appointment with Dr. Ola Spurr on 2/11. We have ordered x-rays for her bilateral feet; [name], [name] ([id number]) he was advised to obtain the x-rays today or tomorrow. He will follow-up next week 05/23/17-he is here in follow-up evaluation for multiple ulcerations to multiple toes bilaterally and the right posterior heel. There is improvement in maceration. He has been compliant and not applying any moisturizing agent to his toes. He has been using Lac-Hydrin for his lower extremities with improvement. He did not go to his appointment on Monday with Dr. Ola Spurr secondary to financial concerns. X-rays for her  bilateral feet showed: LEFT FOOT with slight erosion of the tuft of the distal phalanges of the left first and second toe suspicious for osteomyelitis, RIGHT FOOT with 1.erosion of the tufts of the distal phalanges of the right first second and possibly third toes consistent with osteomyelitis, 2 no definitive abnormality of the calcaneus is seen on the images obtained, 3. Plantar calcaneal degenerative spur. We briefly discussed hyperbaric adjunctive therapy for treatment of chronic refractory osteomyelitis. I do not find an a1c in EMR, will contact PCP for record, or order if needed. He has been encouraged to contact Dr Ola Spurr office regarding the follow-up appointment, encouraged him to inquire about payment plan. We will continue with same treatment plan and follow-up next week. He states he is still taking antibiotics and has "a lot" left. He states he has been taking them as directed, 2 pills twice daily. According to Dr. Blane Ohara office notes he was originally started on 12/17 for 4 weeks and extended on 1/14 for an additional 4 weeks. He should be done with his antibiotic therapy, he was advised to bring his bottles and to his next appointment, we will contact pharmacy. 05/30/17-he is here in follow-up evaluation for multiple ulcerations to multiple toes bilaterally and the right posterior heel. He is accompanied by his brother-in-law. Wounds are stable. He has yet to make up with Dr. Ola Spurr. We contacted his PCP, with no record of recent A1c we will draw an A1c. His brother-in-law states that he was taken off all of his diabetic medication secondary to kidney function. 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 The antibiotics were counted and have approximately 2 weeks left, although they should be complete. The pill  organizer reveals missing days. We discussed the need for  consistent medications, to have optimal benefit of medication. He has a cousin that lives with him and he will ask her to check his organizer daily. He has been advised to follow up with Dr Ola Spurr, and will go by the office today. He has been advised to quit smoking. 06/06/17-he is here in follow up evaluation. He has had to make an appointment with Dr. Ola Spurr. He did have blood work obtained, a1c 5. He continues to take antibiotic therapy. Significant improvement in bilateral lower extremity edema with compression therapy. Essentially no change in ulcerations to toes. He states he is "going to try something different" and "let me know next week" if it works; he would not provide any additional information and was encouraged to follow our orders. We will follow up next week 06/13/17-he is here in follow-up evaluation. He has an appointment with Dr. Ola Spurr tomorrow morning. He states he purchased an ointment from [facility] and applied to his toes for 3 days, he does not remember the name of the ointment. There is improvement to his wounds, minimal maceration. He continues to take antibiotic therapy, this should have been completed last month. His brother-in-law who regularly accompanies his appointments was asked to take the bottles to the appointment tomorrow with Dr. Ola Spurr so he is aware. We will continue with 3 layer compression, and order OPEN TOE compression 20-30mmHg; we will apply compression stockings next week. He continues to smoke, smoked "2 cigarettes" last week 06/20/17 on evaluation today patient did receive his compression stockings which he has with him today for both lower extremities. With that being said he tells me at this point in time that he is very happy to have these he really is not a big fan of the compression wraps that we have been utilizing although they have been of great benefit for him. Nonetheless at this point he does want to switch to the compression  stockings. In my opinion as long as he is continuing with compression I'm okay with the stockings or the wraps. 06/27/17-he is here in follow-up evaluation for multiple ulcerations to his bilateral toes. There is some improvement in appearance. He is compliant in wearing his compression stockings with significant improvement in lower extremity edema. He saw Dr Ola Spurr on 3/8, per his notes they would redraw ESR and CRP; plan to continue antibiotic therapy if these remain elevated. I do not see an ESR or CRP level in Epic. The patient continues to take antibiotics. 07/11/17-He is here in follow-up evaluation for multiple ulcerations to multiple toes bilaterally. He presents with complete epithelialization to the right third toe; there has been no deterioration. He continues on antibiotic therapy. He will follow-up next week 07/18/17-He is here in follow-up evaluation for multiple ulcerations to multiple toes bilaterally. He continues to make improvement. He continues on antibiotic therapy. He states he has been using something additional to our orders, he does not elaborate but states he will bring it in next week. 07/25/17-He is here in follow up evaluation for multiple ulcerations to bilateral toes. He is stable. He has completed antibiotic therapy. He admits to "filing" his toes after showers each evening, this is what he was referencing last week; he does not filing for the wounds. We will switch to [facility] and monitor for any improvement, he will follow-up next week 08/01/17-He is here in follow-up evaluation. He admits to "picking" at his toes after cleansing yesterday, leading to new areas  of tissue loss on the bilateral second toe. There is improvement noted to the bilateral great toe. We will dress toes today and hope that they maintain until Monday where he will come in for a nurse visit. He has been advised, multiple times with expressed verbalization, to change the dressings to  silvercel if the dressings get wet prior to [facility] appointment. 08/08/17-He is here in follow-up evaluation for bilateral first and second toe ulcerations. There is significant improvement to all ulcerations since last visit. We will switch to [facility] to all wounds and he will continue with nurse visits on a Blumberg, Caleb E. ([id number]) Monday/Thursday schedule and follow-up with me in 2 weeks. He continues to smoke, 1-3 cigarettes per day, and has been encouraged to not smoke until his next follow-up in 2 weeks. 08/15/17 on evaluation today patient's ulcers on his toes actually appear to be doing fairly well the right to ulcers may be a little bit more moist compared to the left I'm not really sure exactly why as the openings appear to be very small and I'm not seeing any evidence of anything significant as far as you lightest or otherwise. Nonetheless this may just be a small setback he's been doing very well with the [facility] Dressing 08/22/17-He is here in follow-up evaluation for ulcerations to his bilateral first and second toes, there is small/scant amount of drainage noted on today's dressing. He continues with Hydrofera Blue. He continues to smoke, 1 cigarette a day. Voices no complaint or concerns, compliant and compression therapy today. He'll follow-up next week 08/29/17-He is here in follow-up evaluation for ulcerations to his bilateral first and second toes, with a new wound to the left third toe. He states he cut himself while cutting the toenail. He presents today with more maceration and increased measurements; waxing and waning measurements and moisture has been an ongoing issue. He continues to smoke a proximally 1 cigarette per day, but states he has not smoked since Sunday. We will initiate medihoney daily and evaluate next week. He presents today without compression stockings 09/05/17-He is here in follow-up evaluation for ulcerations to bilateral first and second and  left third toe. He did not pick up the medihoney and therefore has not been changing his dressing. He admits to inconsistent where of compression stockings, admits to pain to his bilateral lower extremities with unilateral edema (right greater than left). The ulcerations to multiple toes are more macerated and larger in size than last week. He is wearing compression therapy today. He admits to an overall feeling of weakness and fatigue and has been encouraged to contact his PCP for evaluation; he has a known history of anemia and intraabdominal lymphadenopathy. We will return to silvercel and he will follow up next week 09/19/17-He is here in follow-up evaluation for ulcerations to bilateral first and second toe.he was unable to make last week's appointment. The culture that was obtained on 5/30 grew clindamycin sensitive MRSA; he was initiated on 6/4 but did not start it until 6/7. There is significant improvement in all wounds, healing to left second and third toe. He also admits to having no alcohol or smoking for the last 6 days. We will continue with same treatment plan I will extend the clindamycin for an additional 10 days and he will follow-up next week. 09/26/17- He is here in follow evaluation for multiple ulcerations to multiple toes bilaterally. He continues to be non-compliant with dressing, compression therapy. He states he is taking the antibiotics  as prescribed, although his recall is inconsistent. There is noted deterioration in all ulcers. We will continue same treatment plan, antibiotic therapy and he will follow up next week. 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 This information is obtained from the medical record, as he cannot articulate specifics to his diagnosis or treatment plan. 10/17/17-He is here  in follow-up evaluation for multiple ulcerations to multiple toes bilaterally. There is chronic waxing and waning improvement/deterioration. On today's exam they appear improved, dry without maceration. He continues to apply a variety of topical agents, stating that he has switched between the blue product and silver product. In my opinion, this will be a chronic waxing and waning giving his intermittent compliance. He will follow-up in 2 weeks. As it relates to the biopsy taken from his buttock, he has not followed up with that and has not started radiation therapy. 10/31/17-He is here for evaluation for multiple ulcerations to multiple toes bilaterally. Continues with waxing and waning improvement versus deterioration. They are stable on today's exam without significant maceration. 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 We will continue with every 2 week follow-up appointments 11/14/17- He is seen in follow-up evaluation for multiple ulcerations to multiple bilateral toes. He is scheduled for excision of basal cell carcinoma to his right buttock on Monday 8/12. He continues to have chronic waxing and waning of his ulcers; today is a stable day with minimal drainage and left ear is better than right toes. I will recount to infectious disease regarding chronic suppressive therapy. He admits to being more compliant with compression stockings, although he is not wearing any today. He has been encouraged to continue with dressing changes as ordered and compression therapy daily. He will be seen in 2 weeks 11/28/17-He is seen in follow up evaluation for multiple ulcerations to multiple toes bilaterally. He had an overnight admission at [facility] (8/11-8/12) for symptomatic anemia, received two units prbc. His surgical date has been moved to 9/94 excision of basal cell carcinoma to the right buttock. He has been noncompliant in wearing compression stockings,  applying topical treatment as ordered; he has been applying a "cream", Hydrofera Blue, silvercel. He is currently on no antibiotic therapy; we will send for re-referral to ID for suppressive therapy given his multiple co-morbidities, poor surgical candidate, unwilling to have toe amputation and general non-adherence. 12/19/17 on evaluation today patient actually appears to be doing very well in regard to his toes at least compared to last time I saw him. He again has been coming to our office for quite a bit of time. 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 He states is hurting a little bit but doing well in general. I'm see were not taking care of this area dermatology is. He does have his appointment with infectious disease upcoming which they re-consult for suppressive therapy that's next Monday. 01/02/18 on evaluation today patient actually appears to be doing rather well in regard to his toe ulcers although he tells me he [name], [name] ([id number]) has been put in an ointment on this which I am concerned may be causing some additional moisture buildup which we really do not want. He supposed be using  silver cell which is mainly thing I want him to be utilizing. Fortunately there does not appear to be any evidence of worsening infection which is good news. He has had surgery on his gluteal region where he had an excision of a cancerous area fortunately that seems to be doing well although he is having some discomfort. 02/06/18 on evaluation today patient actually appears to be doing well at all locations except for his second toe use bilaterally where the distal tip is still wrong open. He did state that he can't stand for dry skin to buildup on his toes subsequently as a question and further it sounds as if he actually pills this all which is likely keeping the toe is wrong and open.  Also think that's what happened in the bottom of his foot although that appears to have healed at this point. There does not appear to be any evidence of infection. 02/20/18 on evaluation today patient appears to be doing better in regard to his foot ulcers. Everything has shown signs of improvement which is excellent news. Overall I'm very pleased with how things appear currently. The patient states he's not picking her point at any skin and that he wears shoes at all times when he is ambulating at home. Both are things that I've advocated and recommended for him in the past. 03/13/18 on evaluation today patient actually appears to be doing very well in regard to the bilateral toes in the overall appearance of the wound bed locations. Fortunately there does not appear to be any signs of infection at this time. He states that he is very pleased with how things seem to be progressing to be honest as am I. 03/27/1899 evaluation today patient appears to be doing well in regard to his toe ulcers. He just has a few areas remaining and these appear to be showing signs of improvement as well which is great news. Overall I have been impressed with the progress of the past several weeks. 04/10/18 on evaluation today patient appears to be doing a little bit worse in regard to the right plantar foot at the regional the first metatarsal as well as the left second toe. He tells me that he ran out of bandages he also tells me he's been wearing his flip-flops a lot at home as well as a new pair of boots that he received as well. This is as opposed to the postop shoes that we had previously given him. Nonetheless overall I feel like that he has had a little bit more friction to the area which is causing things to look a little bit worse today. 04/24/18 on evaluation today patient's wounds for the most part appear to be doing excellent especially in regard to the bilateral second toes. With that being said he does  have issues with the wound still on the first metatarsal region and first toe of the right foot which have not completely closed at this point. Nonetheless I do feel like that he is making progress which is good news. 05/08/18 on evaluation today patient's wounds appear to be doing just a little bit worse than normal simply due to the fact that he has not had dressing supplies. He apparently owed $35 in order for them to be shipped and unfortunately is not able to pay that currently. We did give him information for the [place] today which will hopefully help cover his dressing supplies. He was thankful for this he states is [place] contact  them this afternoon. Fortunately otherwise or does not hear any signs of infection. 05/22/18 on evaluation today patient appears to be doing somewhat poorly in regard to his toes. Upon further questioning it appears that he actually has been pulling skin off of the toes which "drive him crazy". Subsequently as he does this he calls his new areas to tear open and I think this is one of the reasons why his toes are not healing as appropriately as we would like. He fortunately has no signs of infection at this time. With that being said I'm still concerned about the possibility of a fungal infection even though there doesn't appear to be anything bacterial. Potentially nystatin powder could be of benefit for him as far as trying to help this in that regard. 06/05/18 on evaluation today patient actually appears to be doing very well in regard to his foot ulcers. Fortunately there's no signs of infection. He's been tolerating the dressing changes without complication. I'm very pleased with how things seem to be progressing. Fortunately there's no evidence of active infection at this point I do believe nystatin powder has been of benefit for him. 06/19/18 on evaluation today patient actually appears to be doing a little worse compared to last evaluation.  Tells me that he has not had any supplies for two weeks. With that being said his plantar foot wounds on the right actually appear to be doing very well. It's actually a second toes in the first toes that are the worst. I think these are areas the was able to get to and pick and he tells me in fact that he destine to pick out some of the dry skin because it causes him annoyance. Nonetheless I think this is somewhat detrimental to him I think it's worth keeping part of the wound open in particular. Fortunately there is no sign of infection at this time although I think that is a risk which I discussed specifically that he's not careful you can end up reopening this and causing greater problems for himself. KINGSON, LOHMEYER ([id number]) 07/03/18 on evaluation today patient's wound bed actually shows evidence of good granulation at this time and epithelialization as far as the wounds in general are concerned. He has been tolerating the dressing changes including the nystatin without complication. This is good news. With that being said he seems to be still having some trouble with the left second toe distally at this point. 4/9; most of the wounds look quite satisfactory. He requires debridement of the plantar right great toe otherwise everything looks superficial to improved. His left second toe is healed. 08/14/18 on evaluation today patient actually appears to be doing very well in regard to his foot ulcerations. In fact he just has three small areas of the right foot still remaining open the left foot is closed and dry at this point. Overall I feel like he is doing quite well which is excellent news. Fortunately there's no signs of active infection. Patient History Information obtained from Patient. Social History Current every day smoker, Marital Status - Widowed, Alcohol Use - Daily - quit drinking about a week ago, Drug Use - No History, Caffeine Use - Moderate. Medical History Eyes Denies  history of Cataracts, Glaucoma, Optic Neuritis Ear/Nose/Mouth/Throat Denies history of Chronic sinus problems/congestion, Middle ear problems Hematologic/Lymphatic Patient has history of Anemia, Lymphedema Denies history of Hemophilia, Human Immunodeficiency Virus, Sickle Cell Disease Respiratory Denies history of Aspiration, Asthma, Chronic Obstructive Pulmonary Disease (COPD), Pneumothorax, Sleep Apnea,  Tuberculosis Cardiovascular Patient has history of Congestive Heart Failure, Hypertension, Peripheral Venous Disease Denies history of Angina, Arrhythmia, Coronary Artery Disease, Deep Vein Thrombosis, Hypotension, Myocardial Infarction, Peripheral Arterial Disease, Phlebitis, Vasculitis Gastrointestinal Denies history of Cirrhosis , Colitis, Crohn s, Hepatitis A, Hepatitis B, Hepatitis C Endocrine Patient has history of Type II Diabetes Genitourinary Denies history of End Stage Renal Disease Immunological Denies history of Lupus Erythematosus, Raynaud s, Scleroderma Integumentary (Skin) Denies history of History of Burn, History of pressure wounds Musculoskeletal Denies history of Gout, Rheumatoid Arthritis, Osteoarthritis, Osteomyelitis Neurologic Patient has history of Neuropathy Denies history of Dementia, Quadriplegia, Paraplegia, Seizure Disorder Medical And Surgical History Notes 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 Respiratory Denies complaints or symptoms of Chronic or frequent coughs, Shortness of Breath. [name], [name] ([id number]) Cardiovascular Denies complaints or symptoms of Chest pain, LE edema. Neurologic Denies complaints or symptoms of Numbness/parasthesias, Focal/Weakness. Objective Constitutional Well-nourished and well-hydrated in no acute distress. Vitals Time Taken: 8:30  AM, Height: 69 in, Weight: 168 lbs, BMI: 24.8, Temperature: 98.2 F, Pulse: 71 bpm, Respiratory Rate: 16 breaths/min, Blood Pressure: 171/78 mmHg. General Notes: Patient states he took BP meds this morning at 8am. Respiratory normal breathing without difficulty. clear to auscultation bilaterally. Cardiovascular regular rate and rhythm with normal S1, S2. Psychiatric this patient is able to make decisions and demonstrates good insight into disease process. Alert and Oriented x 3. pleasant and cooperative. General Notes: Patient's wound bed currently shows evidence of good granulation at this time. There does not appear to be any evidence of again infection he seems to be healing quite nicely and overall very pleased with the progress that is made up till today. Integumentary (Hair, Skin) Wound #16 status is Open. Original cause of wound was Not Known. The wound is located on the [employer]. The wound measures 0.8cm length x 1cm width x 0.1cm depth; 0.628cm^2 area and 0.063cm^3 volume. There is Fat Layer (Subcutaneous Tissue) Exposed exposed. There is no tunneling or undermining noted. There is a medium amount of serous drainage noted. The wound margin is flat and intact. There is large (67-100%) pink granulation within the wound bed. There is a small (1-33%) amount of necrotic tissue within the wound bed including Adherent Slough. Wound #16 status is Open. Original cause of wound was Not Known. The wound is located on the [employer]. The wound measures 0.4cm length x 0.3cm width x 0.2cm depth; 0.094cm^2 area and 0.019cm^3 volume. There is Fat Layer (Subcutaneous Tissue) Exposed exposed. There is no tunneling or undermining noted. There is a medium amount of serous drainage noted. The wound margin is flat and intact. There is large (67-100%) pink granulation within the wound bed. There is a small (1-33%) amount of necrotic tissue within the wound bed including Adherent  Slough. The periwound skin appearance exhibited: Scarring. The periwound skin appearance did not exhibit: Callus, Crepitus, Excoriation, Induration, Rash, Dry/Scaly, Maceration, Atrophie Blanche, Cyanosis, Ecchymosis, Hemosiderin Staining, Mottled, Pallor, Rubor, Erythema. Periwound temperature was noted as No Abnormality. Wound #17 status is Healed - Epithelialized. Original cause of wound was Trauma. The wound is located on the Right,Distal,Plantar Metatarsal head second. The wound measures 0cm length x 0cm width x 0cm depth; 0cm^2 area and 0cm^3 volume. There is no tunneling or undermining noted. There is a none present amount of drainage noted. The wound margin is  flat and intact. There is no granulation within the wound bed. There is no necrotic tissue within the wound bed. The periwound skin appearance exhibited: Scarring, Dry/Scaly. The periwound skin appearance did not exhibit: Callus, Crepitus, Excoriation, Induration, Rash, Maceration, Atrophie Blanche, Cyanosis, Ecchymosis, Hemosiderin Staining, Mottled, Pallor, [place], Leroy E. ([id number]) Rubor, Erythema. Periwound temperature was noted as No Abnormality. Wound #18 status is Open. Original cause of wound was Gradually Appeared. The wound is located on the Right,[employer] The wound measures 0.5cm length x 1.2cm width x 0.1cm depth; 0.471cm^2 area and 0.047cm^3 volume. There is no tunneling noted. There is a medium amount of serous drainage noted. The wound margin is flat and intact. There is large (67- 100%) pink granulation within the wound bed. There is no necrotic tissue within the wound bed. The periwound skin appearance exhibited: Scarring. The periwound skin appearance did not exhibit: Callus, Crepitus, Excoriation, Induration, Rash, Dry/Scaly, Maceration, Atrophie Blanche, Cyanosis, Ecchymosis, Hemosiderin Staining, Mottled, Pallor, Rubor, Erythema. Periwound temperature was noted as No Abnormality. Wound #2 status is  Open. Original cause of wound was Gradually Appeared. The wound is located on the Right Toe Second. The wound measures 0.1cm length x 0.1cm width x 0.1cm depth; 0.008cm^2 area and 0.001cm^3 volume. The wound is limited to skin breakdown. There is no tunneling or undermining noted. There is a small amount of serous drainage noted. The wound margin is indistinct and nonvisible. There is large (67-100%) pink granulation within the wound bed. There is no necrotic tissue within the wound bed. The periwound skin appearance exhibited: Scarring. The periwound skin appearance did not exhibit: Callus, Crepitus, Excoriation, Induration, Rash, Dry/Scaly, Maceration, Atrophie Blanche, Cyanosis, Ecchymosis, Hemosiderin Staining, Mottled, Pallor, Rubor, Erythema. Periwound temperature was noted as No Abnormality. 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 Wound #18 Right,Dorsal Toe Great: Clean wound with Normal Saline. Wound #2 Right Toe Second: Clean wound with Normal Saline. Anesthetic (add to Medication List): Wound #16 Right,Plantar Toe Great: Topical Lidocaine 4% cream applied to wound bed prior to debridement (In Clinic Only). Wound #18 Right,Dorsal Toe Great: Topical Lidocaine 4% cream applied to wound bed prior to debridement (In Clinic Only). Wound #2 Right Toe Second: Topical Lidocaine 4% cream applied to  wound bed prior to debridement (In Clinic Only). Skin Barriers/Peri-Wound Care: Wound #16 Right,Plantar Toe GreatLELDON, STEEGE ([id number]) Antifungal powder-Nystatin Wound #18 Right,Dorsal Toe Great: Antifungal powder-Nystatin Wound #2 Right Toe Second: Antifungal powder-Nystatin Primary Wound Dressing: Wound #16 Right,Plantar Toe Great: Silver Alginate - secure with tape Wound #18 Right,Dorsal Toe Great: Silver Alginate - secure with tape Wound #2 Right Toe Second: Silver Alginate - secure with tape Dressing Change Frequency: Wound #16 Right,Plantar Toe Great: Change dressing every other day. Wound #18 Right,Dorsal Toe Great: Change dressing every other day. Wound #2 Right Toe Second: Change dressing every other day. Follow-up Appointments: Wound #16 Right,Plantar Toe Great: Return Appointment in 2 weeks. Wound #18 Right,Dorsal Toe Great: Return Appointment in 2 weeks. Wound #2 Right Toe Second: Return Appointment in 2 weeks. Edema Control:  Wound #16 Right,Plantar Toe Great: Patient to wear own compression stockings Wound #18 Right,Dorsal Toe Great: Patient to wear own compression stockings Wound #2 Right Toe Second: Patient to wear own compression stockings Additional Orders / Instructions: Wound #16 Right,Plantar Toe Great: Stop Smoking Increase protein intake. Wound #18 Right,Dorsal Toe Great: Stop Smoking Increase protein intake. Wound #2 Right Toe Second: Stop Smoking Increase protein intake. Medications-please add to medication list.: Wound #16 Right,Plantar Toe Great: Other: - Nystatin Powder Wound #18 Right,Dorsal Toe Great: Other: - Nystatin Powder Wound #2 Right Toe Second: Other: - Nystatin Powder I'm in a recommend that we continue with the above wound care measures for the next week he is in agreement with that plan. He is doing well and knows the routine very well he states he will get in the shower and clean his feet really well washing  everything extremely well then when he gets out he applies his powder and then subsequently will apply the alginate and for any of the open areas. This has done extremely well for him in a very pleased with how things seem to be moving at this point. I will see him back for reevaluation in one weeks time. [facility] ([id number]) Please see above for specific wound care orders. We will see patient for re-evaluation in 1 week(s) here in the clinic. If anything worsens or changes patient will contact our office for additional recommendations. Electronic Signature(s) Signed: 08/18/2018 9:44:32 AM By: Worthy Keeler PA-C Entered By: Worthy Keeler on 08/14/2018 13:10:51 Teffeteller, [name] ([id number]) -------------------------------------------------------------------------------- ROS/PFSH Details Patient Name: Thrall, Tylar E. Date of Service: 08/14/2018 8:15 AM Medical Record Number: [id number] Patient Account Number: [id number] Date of Birth/Sex: 1950-08-17 (68 y.o. M) Treating RN: Cornell Barman Primary Care Provider: Lamonte Sakai Other Clinician: Referring Provider: Lamonte Sakai Treating Provider/Extender: Melburn Hake, HOYT Weeks in Treatment: 8 Information Obtained From Patient Constitutional Symptoms (General Health) Complaints and Symptoms: Negative for: Fatigue; Fever; Chills; Marked Weight Change Respiratory Complaints and Symptoms: Negative for: Chronic or frequent coughs; Shortness of Breath Medical History: Negative for: Aspiration; Asthma; Chronic Obstructive Pulmonary Disease (COPD); Pneumothorax; Sleep Apnea; Tuberculosis Cardiovascular Complaints and Symptoms: Negative for: Chest pain; LE edema Medical History: Positive for: Congestive Heart Failure; Hypertension; Peripheral Venous Disease Negative for: Angina; Arrhythmia; Coronary Artery Disease; Deep Vein Thrombosis; Hypotension; Myocardial Infarction; Peripheral Arterial Disease; Phlebitis;  Vasculitis Neurologic Complaints and Symptoms: Negative for: Numbness/parasthesias; Focal/Weakness Medical History: Positive for: Neuropathy Negative for: Dementia; Quadriplegia; Paraplegia; Seizure Disorder Eyes Medical History: Negative for: Cataracts; Glaucoma; Optic Neuritis Ear/Nose/Mouth/Throat Medical History: Negative for: Chronic sinus problems/congestion; Middle ear problems Hematologic/Lymphatic Medical History: Positive for: Anemia; Lymphedema Chesmore, Kerwin E. ([id number]) Negative for: Hemophilia; Human Immunodeficiency Virus; Sickle Cell Disease Gastrointestinal Medical History: Negative for: Cirrhosis ; Colitis; Crohnos; Hepatitis A; Hepatitis B; Hepatitis C Endocrine Medical History: Positive for: Type II Diabetes Treated with: Oral agents Blood sugar tested every day: Yes Tested : QD Genitourinary Medical History: Negative for: End Stage Renal Disease Immunological Medical History: Negative for: Lupus Erythematosus; Raynaudos; Scleroderma Integumentary (Skin) Medical History: Negative for: History of Burn; History of pressure wounds Musculoskeletal Medical History: Negative for: Gout; Rheumatoid Arthritis; Osteoarthritis; Osteomyelitis Oncologic Medical History: Past Medical History Notes: 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 ISSACC, MERLO ([id number]) Electronic Signature(s) Signed: 08/14/2018 5:18:06 PM By: Gretta Cool,  BSN, RN, CWS, Kim RN, BSN Signed: 08/18/2018 9:44:32 AM By: Worthy Keeler PA-C Entered By: Worthy Keeler on 08/14/2018 08:52:09 Strange, [name] ([id number]) -------------------------------------------------------------------------------- SuperBill Details Patient Name: Bolyard, Xavian E. Date of Service: 08/14/2018 Medical Record Number: [id number] Patient Account Number: [id number] Date of Birth/Sex: 1951-03-11 (68 y.o. M) Treating RN: Cornell Barman Primary Care Provider: Lamonte Sakai Other Clinician: Referring Provider: Lamonte Sakai Treating Provider/Extender: Melburn Hake, HOYT Weeks in Treatment: 59 Diagnosis Coding ICD-10 Codes Code Description L97.521 Non-pressure chronic ulcer of other part of left foot limited to breakdown of skin L97.511 Non-pressure chronic ulcer of other part of right foot limited to breakdown of skin E11.621 Type 2 diabetes mellitus with foot ulcer I87.323 Chronic venous hypertension (idiopathic) with inflammation of bilateral lower extremity I89.0 Lymphedema, not elsewhere classified F17.218 Nicotine dependence, cigarettes, 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 abuse 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, right ankle and foot Facility Procedures CPT4 Code: 89842103 Description: 99214 - WOUND CARE VISIT-LEV 4 EST PT Modifier: Quantity: 1 Physician Procedures CPT4 Code Description: [id number] [id number] - WC PHYS LEVEL 4 - EST PT ICD-10 Diagnosis Description L97.521 Non-pressure chronic ulcer of other part of left foot limited to L97.511 Non-pressure chronic ulcer of other part of right foot limited t E11.621 Type 2  diabetes mellitus with foot ulcer I87.323 Chronic venous hypertension (idiopathic) with inflammation of bi Modifier: breakdown of s o breakdown of lateral lower e Quantity: 1 kin skin xtremity Electronic Signature(s) Signed: 08/14/2018 4:45:43 PM By: Gretta Cool, BSN, RN, CWS, Kim RN, BSN Signed: 08/18/2018  9:44:32 AM By: Worthy Keeler PA-C Entered By: Gretta Cool, BSN, RN, CWS, Kim on 08/14/2018 16:45:41

## 2018-08-28 ENCOUNTER — Other Ambulatory Visit: Payer: Self-pay

## 2018-08-28 ENCOUNTER — Encounter: Payer: Medicare Other | Admitting: Physician Assistant

## 2018-08-28 DIAGNOSIS — E11621 Type 2 diabetes mellitus with foot ulcer: Secondary | ICD-10-CM | POA: Diagnosis not present

## 2018-08-28 NOTE — Progress Notes (Signed)
[name] [name] ([id number]) Visit Report for 08/28/2018 Arrival Information Details Patient Name: [name] [name] JOPLIN. Date of Service: 08/28/2018 8:15 AM Medical Record Number: [id number] Patient Account Number: [id number] Date of Birth/Sex: 1951/03/24 (68 y.o. M) Treating RN: Harold Barban Primary Care [name] [name] Other Clinician: Referring [name] [name] Treating [name] [name] Weeks in Treatment: 100 Visit Information History Since Last Visit Added or deleted any medications: No Patient Arrived: Ambulatory Any new allergies or adverse reactions: No Arrival Time: 08:37 Had a fall or experienced change in No Accompanied By: self activities of daily living that may affect Transfer Assistance: None risk of falls: Patient Identification Verified: Yes Signs or symptoms of abuse/neglect since last visito No Secondary Verification Process Completed: Yes Hospitalized since last visit: No Patient Requires Transmission-Based No Has Dressing in Place as Prescribed: Yes Precautions: Pain Present Now: No Patient Has Alerts: Yes Patient Alerts: DMII Electronic Signature(s) Signed: 08/28/2018 2:09:52 PM By: Harold Barban Entered By: Harold Barban on 08/28/2018 08:38:27 [name], [name] [name] ([id number]) -------------------------------------------------------------------------------- Clinic Level of Care Assessment Details Patient Name: [name] [name] E. Date of Service: 08/28/2018 8:15 AM Medical Record Number: [id number] Patient Account Number: [id number] Date of Birth/Sex: 21-Jul-1950 (68 y.o. M) Treating RN: Montey Hora Primary Care Raymir Frommelt: Lamonte [name] Other Clinician: Referring Summer Mccolgan: Lamonte [name] Treating Benedict Kue/Extender: Melburn Hake, [name] Weeks in Treatment: 100 Clinic Level of Care Assessment Items TOOL 4 Quantity Score []  - Use when only an EandM is performed on FOLLOW-UP visit 0 ASSESSMENTS - Nursing Assessment / Reassessment X -  Reassessment of Co-morbidities (includes updates in patient status) 1 10 X- 1 5 Reassessment of Adherence to Treatment Plan ASSESSMENTS - Wound and Skin Assessment / Reassessment []  - Simple Wound Assessment / Reassessment - one wound 0 X- 3 5 Complex Wound Assessment / Reassessment - multiple wounds []  - 0 Dermatologic / Skin Assessment (not related to wound area) ASSESSMENTS - Focused Assessment []  - Circumferential Edema Measurements - multi extremities 0 []  - 0 Nutritional Assessment / Counseling / Intervention X- 1 5 Lower Extremity Assessment (monofilament, tuning fork, pulses) []  - 0 Peripheral Arterial Disease Assessment (using hand held doppler) ASSESSMENTS - Ostomy and/or Continence Assessment and Care []  - Incontinence Assessment and Management 0 []  - 0 Ostomy Care Assessment and Management (repouching, etc.) PROCESS - Coordination of Care X - Simple Patient / Family Education for ongoing care 1 15 []  - 0 Complex (extensive) Patient / Family Education for ongoing care X- 1 10 Staff obtains Programmer, systems, Records, Test Results / Process Orders []  - 0 Staff telephones HHA, Nursing Homes / Clarify orders / etc []  - 0 Routine Transfer to another Facility (non-emergent condition) []  - 0 Routine Hospital Admission (non-emergent condition) []  - 0 New Admissions / Biomedical engineer / Ordering NPWT, Apligraf, etc. []  - 0 Emergency Hospital Admission (emergent condition) X- 1 10 Simple Discharge Coordination []  - 0 Complex (extensive) Discharge Coordination PROCESS - Special Needs []  - Pediatric / Minor Patient Management 0 []  - 0 Isolation Patient Management [name], [name] ([id number]) []  - 0 Hearing / Language / Visual special needs []  - 0 Assessment of Community assistance (transportation, D/C planning, etc.) []  - 0 Additional assistance / Altered mentation []  - 0 Support Surface(s) Assessment (bed, cushion, seat, etc.) INTERVENTIONS - Wound Cleansing /  Measurement []  - Simple Wound Cleansing - one wound 0 []  - 0 Complex Wound Cleansing - multiple wounds []  - 0 Wound Imaging (photographs - any number of wounds) []  - 0  Wound Tracing (instead of photographs) []  - 0 Simple Wound Measurement - one wound []  - 0 Complex Wound Measurement - multiple wounds INTERVENTIONS - Wound Dressings X - Small Wound Dressing one or multiple wounds 3 10 []  - 0 Medium Wound Dressing one or multiple wounds []  - 0 Large Wound Dressing one or multiple wounds X- 1 5 Application of Medications - topical []  - 0 Application of Medications - injection INTERVENTIONS - Miscellaneous []  - External ear exam 0 []  - 0 Specimen Collection (cultures, biopsies, blood, body fluids, etc.) []  - 0 Specimen(s) / Culture(s) sent or taken to Lab for analysis []  - 0 Patient Transfer (multiple staff / Civil Service fast streamer / Similar devices) []  - 0 Simple Staple / Suture removal (25 or less) []  - 0 Complex Staple / Suture removal (26 or more) []  - 0 Hypo / Hyperglycemic Management (close monitor of Blood Glucose) []  - 0 Ankle / Brachial Index (ABI) - do not check if billed separately X- 1 5 Vital Signs Has the patient been seen at the hospital within the last three years: Yes Total Score: 110 Level Of Care: New/Established - Level 3 Electronic Signature(s) Signed: 08/28/2018 4:20:47 PM By: Montey Hora Entered By: Montey Hora on 08/28/2018 09:16:37 [name] [name] ([id number]) -------------------------------------------------------------------------------- Encounter Discharge Information Details Patient Name: [name], [name] Date of Service: 08/28/2018 8:15 AM Medical Record Number: [id number] Patient Account Number: [id number] Date of Birth/Sex: 1950/07/19 (68 y.o. M) Treating RN: Montey Hora Primary Care Jai Steil: Lamonte [name] Other Clinician: Referring Shaton Lore: Lamonte [name] Treating Nakeitha Milligan/Extender: Melburn Hake, [name] Weeks in Treatment: 100 Encounter  Discharge Information Items Discharge Condition: Stable Ambulatory Status: Ambulatory Discharge Destination: Home Transportation: Private Auto Accompanied By: self Schedule Follow-up Appointment: Yes Clinical Summary of Care: Electronic Signature(s) Signed: 08/28/2018 9:59:56 AM By: Montey Hora Entered By: Montey Hora on 08/28/2018 09:59:56 [name] [name] ([id number]) -------------------------------------------------------------------------------- Lower Extremity Assessment Details Patient Name: Mickley, Essex E. Date of Service: 08/28/2018 8:15 AM Medical Record Number: [id number] Patient Account Number: [id number] Date of Birth/Sex: 1950-05-30 (68 y.o. M) Treating RN: Harold Barban Primary Care Steffani Dionisio: Lamonte [name] Other Clinician: Referring Aislinn Feliz: Lamonte [name] Treating Jermey Closs/Extender: Melburn Hake, [name] Weeks in Treatment: 100 Vascular Assessment Pulses: Dorsalis Pedis Palpable: [Right:Yes] Posterior Tibial Palpable: [Right:Yes] Electronic Signature(s) Signed: 08/28/2018 2:09:52 PM By: Harold Barban Entered By: Harold Barban on 08/28/2018 08:46:05 [name] [name] E. ([id number]) -------------------------------------------------------------------------------- Multi Wound Chart Details Patient Name: [name] [name] E. Date of Service: 08/28/2018 8:15 AM Medical Record Number: [id number] Patient Account Number: [id number] Date of Birth/Sex: 1950-09-07 (68 y.o. M) Treating RN: Montey Hora Primary Care Stephany Poorman: Lamonte [name] Other Clinician: Referring Jayce Boyko: Lamonte [name] Treating Waylan Busta/Extender: Melburn Hake, [name] Weeks in Treatment: 100 Vital Signs Height(in): 94 Pulse(bpm): 24 Weight(lbs): 168 Blood Pressure(mmHg): 178/80 Body Mass Index(BMI): 25 Temperature(F): 98.7 Respiratory Rate 18 (breaths/min): Photos: Wound Location: Right Toe Great - Plantar Right Toe Great - Dorsal Right Toe Second Wounding Event: Not Known Gradually Appeared Gradually  Appeared Primary Etiology: Diabetic Wound/Ulcer of 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 Lower Extremity Lower Extremity Comorbid History: Anemia, Lymphedema, Anemia, Lymphedema, Anemia, Lymphedema, Congestive Heart Failure, Congestive Heart Failure, Congestive Heart Failure, Hypertension, Peripheral Hypertension, Peripheral Hypertension, Peripheral Venous Disease, Type II Venous Disease, Type II Venous Disease, Type II Diabetes, Neuropathy Diabetes, Neuropathy Diabetes, Neuropathy Date Acquired: 10/29/2017 05/22/2018 06/11/2016 Weeks of Treatment: 43 14 100 Wound Status: Open Open Open Pending Amputation on No No Yes Presentation: Measurements L x W x D 0.4x0.5x0.2 0.5x2x0.1  0.1x0.1x0.1 (cm) Area (cm) : 0.157 0.785 0.008 Volume (cm) : 0.031 0.079 0.001 % Reduction in Area: 90.20% 2.00% 99.70% % Reduction in Volume: 80.60% 1.30% 99.70% Classification: Grade 1 Grade 1 Grade 2 Exudate Amount: Medium Medium Small Exudate Type: Serous Serous Serous Exudate Color: amber amber amber Wound Margin: Flat and Intact Flat and Intact Indistinct, nonvisible Granulation Amount: Large (67-100%) Large (67-100%) Large (67-100%) Granulation Quality: Pink Pink Pink Necrotic Amount: Small (1-33%) None Present (0%) None Present (0%) Exposed Structures: Fat Layer (Subcutaneous Fascia: No Fascia: No Tissue) Exposed: Yes Fat Layer (Subcutaneous Fat Layer (Subcutaneous Fascia: No Tissue) Exposed: No Tissue) Exposed: No Tendon: No Tendon: No Tendon: No Muscle: No Muscle: No Muscle: No Joint: No Joint: No Joint: No Bone: No Bone: No Bone: No Limited to Skin Breakdown Epithelialization: Small (1-33%) Medium (34-66%) Large (67-100%) Dorsey, Zarin E. ([id number]) Treatment Notes Electronic Signature(s) Signed: 08/28/2018 4:20:47 PM By: Montey Hora Entered By: Montey Hora on 08/28/2018 09:14:29 [name] [name]  ([id number]) -------------------------------------------------------------------------------- Terminous Details Patient Name: Urenda, Vinicius E. Date of Service: 08/28/2018 8:15 AM Medical Record Number: [id number] Patient Account Number: [id number] Date of Birth/Sex: 1950-10-17 (68 y.o. M) Treating RN: Montey Hora Primary Care Javen Hinderliter: Lamonte [name] Other Clinician: Referring Pernell Dikes: Lamonte [name] Treating Tiana Sivertson/Extender: Melburn Hake, [name] Weeks in Treatment: 100 Active Inactive Abuse / Safety / Falls / Self Care Management Nursing Diagnoses: Potential for falls Goals: Patient will remain injury free related to falls Date Initiated: 09/21/2016 Target Resolution Date: 09/14/2017 Goal Status: Active Interventions: Assess fall risk on admission and as needed Notes: Nutrition Nursing Diagnoses: Potential for alteratiion in Nutrition/Potential for imbalanced nutrition Goals: Patient/caregiver agrees to and verbalizes understanding of need to use nutritional supplements and/or vitamins as prescribed Date Initiated: 09/21/2016 Target Resolution Date: 09/14/2017 Goal Status: Active Interventions: Assess patient nutrition upon admission and as needed per policy Notes: Orientation to the Wound Care Program Nursing Diagnoses: Knowledge deficit related to the wound healing center program Goals: Patient/caregiver will verbalize understanding of the [facility] Date Initiated: 09/21/2016 Target Resolution Date: 06/15/2017 Goal Status: Active Interventions: Provide education on orientation to the wound center Notes: Wound/Skin Impairment Nursing Diagnoses: Knowledge deficit related to smoking impact on wound healing [name] [name] E. ([id number]) Goals: Ulcer/skin breakdown will have a volume reduction of 30% by week 4 Date Initiated: 09/21/2016 Target Resolution Date: 08/17/2017 Goal Status: Active Ulcer/skin breakdown will have a volume reduction of 50%  by week 8 Date Initiated: 09/21/2016 Target Resolution Date: 08/17/2017 Goal Status: Active Ulcer/skin breakdown will have a volume reduction of 80% by week 12 Date Initiated: 09/21/2016 Target Resolution Date: 09/14/2017 Goal Status: Active Ulcer/skin breakdown will heal within 14 weeks Date Initiated: 09/21/2016 Target Resolution Date: 08/17/2017 Goal Status: Active Interventions: Assess patient/caregiver ability to obtain necessary supplies Assess patient/caregiver ability to perform ulcer/skin care regimen upon admission and as needed Assess ulceration(s) every visit Notes: Electronic Signature(s) Signed: 08/28/2018 4:20:47 PM By: Montey Hora Entered By: Montey Hora on 08/28/2018 09:14:18 [name] [name] E. ([id number]) -------------------------------------------------------------------------------- Pain Assessment Details Patient Name: Puertas, Jamieon E. Date of Service: 08/28/2018 8:15 AM Medical Record Number: [id number] Patient Account Number: [id number] Date of Birth/Sex: 10/24/1950 (68 y.o. M) Treating RN: Harold Barban Primary Care Uniqua Kihn: Lamonte [name] Other Clinician: Referring Mickey Esguerra: Lamonte [name] Treating Darvin Dials/Extender: Melburn Hake, [name] Weeks in Treatment: 100 Active Problems Location of Pain Severity and Description of Pain Patient Has Paino No Site Locations Pain Management and Medication Current Pain Management: Electronic Signature(s) Signed: 08/28/2018 2:09:52 PM  By: Harold Barban Entered By: Harold Barban on 08/28/2018 08:38:42 [name] [name] ([id number]) -------------------------------------------------------------------------------- Patient/Caregiver Education Details Patient Name: [name] [name] E. Date of Service: 08/28/2018 8:15 AM Medical Record Number: [id number] Patient Account Number: [id number] Date of Birth/Gender: May 04, 1950 (68 y.o. M) Treating RN: Montey Hora Primary Care Physician: Lamonte [name] Other Clinician: Referring Physician:  Lamonte [name] Treating Physician/Extender: Melburn Hake, [name] Weeks in Treatment: 100 Education Assessment Education Provided To: Patient Education Topics Provided Wound/Skin Impairment: Handouts: Other: wound care as ordered Methods: Demonstration, Explain/Verbal Responses: State content correctly Electronic Signature(s) Signed: 08/28/2018 4:20:47 PM By: Montey Hora Entered By: Montey Hora on 08/28/2018 09:59:06 [name] [name] ([id number]) -------------------------------------------------------------------------------- Wound Assessment Details Patient Name: [name] [name] E. Date of Service: 08/28/2018 8:15 AM Medical Record Number: [id number] Patient Account Number: [id number] Date of Birth/Sex: Jan 08, 1951 (68 y.o. M) Treating RN: Harold Barban Primary Care Lashan Gluth: Lamonte [name] Other Clinician: Referring Raiana Pharris: Lamonte [name] Treating Deon Ivey/Extender: Melburn Hake, [name] Weeks in Treatment: 100 Wound Status Wound Number: 16 Primary Diabetic Wound/Ulcer of the Lower Extremity Etiology: Wound Location: Right Toe Great - Plantar Wound Open Wounding Event: Not Known Status: Date Acquired: 10/29/2017 Comorbid Anemia, Lymphedema, Congestive Heart Weeks Of Treatment: 43 History: Failure, Hypertension, Peripheral Venous Clustered Wound: No Disease, Type II Diabetes, Neuropathy Photos Wound Measurements Length: (cm) 0.4 Width: (cm) 0.5 Depth: (cm) 0.2 Area: (cm) 0.157 Volume: (cm) 0.031 % Reduction in Area: 90.2% % Reduction in Volume: 80.6% Epithelialization: Small (1-33%) Tunneling: No Undermining: No Wound Description Classification: Grade 1 Wound Margin: Flat and Intact Exudate Amount: Medium Exudate Type: Serous Exudate Color: amber Foul Odor After Cleansing: No Slough/Fibrino Yes Wound Bed Granulation Amount: Large (67-100%) Exposed Structure Granulation Quality: Pink Fascia Exposed: No Necrotic Amount: Small (1-33%) Fat Layer (Subcutaneous Tissue)  Exposed: Yes Necrotic Quality: Adherent Slough Tendon Exposed: No Muscle Exposed: No Joint Exposed: No Bone Exposed: No Treatment Notes Wound #16 (Right, Plantar Toe Great) Notes silvercel, nystatin powder, secured with tape Electronic Signature(s) Signed: 08/28/2018 2:09:52 PM By: Harold Barban Entered By: Harold Barban on 08/28/2018 08:44:45 Whisner, Shamere E. ([id number]) Goding, Wenzel E. ([id number]) -------------------------------------------------------------------------------- Wound Assessment Details Patient Name: [name] [name] E. Date of Service: 08/28/2018 8:15 AM Medical Record Number: [id number] Patient Account Number: [id number] Date of Birth/Sex: [birth year] (68 y.o. M) Treating RN: Harold Barban Primary Care Cosima Prentiss: Lamonte [name] Other Clinician: Referring Emmauel Hallums: Lamonte [name] Treating Jadan Rouillard/Extender: Melburn Hake, [name] Weeks in Treatment: 100 Wound Status Wound Number: 18 Primary Diabetic Wound/Ulcer of the Lower Extremity Etiology: Wound Location: Right Toe Great - Dorsal Wound Open Wounding Event: Gradually Appeared Status: Date Acquired: 05/22/2018 Comorbid Anemia, Lymphedema, Congestive Heart Weeks Of Treatment: 14 History: Failure, Hypertension, Peripheral Venous Clustered Wound: No Disease, Type II Diabetes, Neuropathy Photos Wound Measurements Length: (cm) 0.5 Width: (cm) 2 Depth: (cm) 0.1 Area: (cm) 0.785 Volume: (cm) 0.079 % Reduction in Area: 2% % Reduction in Volume: 1.3% Epithelialization: Medium (34-66%) Tunneling: No Undermining: No Wound Description Classification: Grade 1 Wound Margin: Flat and Intact Exudate Amount: Medium Exudate Type: Serous Exudate Color: amber Foul Odor After Cleansing: No Slough/Fibrino No Wound Bed Granulation Amount: Large (67-100%) Exposed Structure Granulation Quality: Pink Fascia Exposed: No Necrotic Amount: None Present (0%) Fat Layer (Subcutaneous Tissue) Exposed: No Tendon Exposed:  No Muscle Exposed: No Joint Exposed: No Bone Exposed: No Treatment Notes Wound #18 (Right, Dorsal Toe Great) Notes silvercel, nystatin powder, secured with tape Electronic Signature(s) Signed: 08/28/2018 2:09:52 PM By: Harold Barban Entered By: Harold Barban on 08/28/2018 08:45:08 Hammitt, [name] [name] E. ([id number])  CHAYNE, BAUMGART ([id number]) -------------------------------------------------------------------------------- Wound Assessment Details Patient Name: Wisler, WISTER HOEFLE. Date of Service: 08/28/2018 8:15 AM Medical Record Number: [id number] Patient Account Number: [id number] Date of Birth/Sex: [birth year] (68 y.o. M) Treating RN: Harold Barban Primary Care Mariany Mackintosh: Lamonte [name] Other Clinician: Referring Kaicee Scarpino: Lamonte [name] Treating Zaydyn Havey/Extender: Melburn Hake, [name] Weeks in Treatment: 100 Wound Status Wound Number: 2 Primary Diabetic Wound/Ulcer of the Lower Extremity Etiology: Wound Location: Right Toe Second Wound Open Wounding Event: Gradually Appeared Status: Date Acquired: 06/11/2016 Comorbid Anemia, Lymphedema, Congestive Heart Weeks Of Treatment: 100 History: Failure, Hypertension, Peripheral Venous Clustered Wound: No Disease, Type II Diabetes, Neuropathy Pending Amputation On Presentation Photos Wound Measurements Length: (cm) 0.1 % Red Width: (cm) 0.1 % Red Depth: (cm) 0.1 Epith Area: (cm) 0.008 Tunn Volume: (cm) 0.001 Unde uction in Area: 99.7% uction in Volume: 99.7% elialization: Large (67-100%) eling: No rmining: No Wound Description Classification: Grade 2 Wound Margin: Indistinct, nonvisible Exudate Amount: Small Exudate Type: Serous Exudate Color: amber Foul Odor After Cleansing: No Slough/Fibrino No Wound Bed Granulation Amount: Large (67-100%) Exposed Structure Granulation Quality: Pink Fascia Exposed: No Necrotic Amount: None Present (0%) Fat Layer (Subcutaneous Tissue) Exposed: No Tendon Exposed: No Muscle Exposed: No Joint  Exposed: No Bone Exposed: No Limited to Skin Breakdown Treatment Notes Wound #2 (Right Toe Second) Notes silvercel, nystatin powder, secured with tape Electronic Signature(s) REQUAN, HARDGE ([id number]) Signed: 08/28/2018 2:09:52 PM By: Harold Barban Entered By: Harold Barban on 08/28/2018 08:45:34 [place], [name] [name] ([id number]) -------------------------------------------------------------------------------- Vitals Details Patient Name: Formosa, Ciaran E. Date of Service: 08/28/2018 8:15 AM Medical Record Number: [id number] Patient Account Number: [id number] Date of Birth/Sex: Aug 01, 1950 (68 y.o. M) Treating RN: Harold Barban Primary Care Armel Rabbani: Lamonte [name] Other Clinician: Referring Teresita Fanton: Lamonte [name] Treating Ernestene Coover/Extender: Melburn Hake, [name] Weeks in Treatment: 100 Vital Signs Time Taken: 08:35 Temperature (F): 98.7 Height (in): 69 Pulse (bpm): 66 Weight (lbs): 168 Respiratory Rate (breaths/min): 18 Body Mass Index (BMI): 24.8 Blood Pressure (mmHg): 178/80 Reference Range: 80 - 120 mg / dl Electronic Signature(s) Signed: 08/28/2018 2:09:52 PM By: Harold Barban Entered By: Harold Barban on 08/28/2018 08:39:01

## 2018-08-28 NOTE — Progress Notes (Signed)
DUSTIN, BUMBAUGH ([id number]) Visit Report for 08/28/2018 Chief Complaint Document Details Patient Name: [name], [name] Date of Service: 08/28/2018 8:15 AM Medical Record Number: [id number] Patient Account Number: [id number] Date of Birth/Sex: 11/08/1950 (68 y.o. M) Treating RN: Cornell Barman Primary Care Provider: Lamonte Sakai Other Clinician: Referring Provider: Lamonte Sakai Treating Provider/Extender: Melburn Hake, HOYT Weeks in Treatment: 100 Information Obtained from: Patient Chief Complaint Patient presents for treatment of an open diabetic ulcer to both feet Electronic Signature(s) Signed: 08/28/2018 5:22:21 PM By: Worthy Keeler PA-C Entered By: Worthy Keeler on 08/28/2018 08:58:39 [name], [name] ([id number]) -------------------------------------------------------------------------------- HPI Details Patient Name: [name], [name] Date of Service: 08/28/2018 8:15 AM Medical Record Number: [id number] Patient Account Number: [id number] Date of Birth/Sex: 1950/04/25 (68 y.o. M) Treating RN: Cornell Barman Primary Care Provider: Lamonte Sakai Other Clinician: Referring Provider: Lamonte Sakai Treating Provider/Extender: Melburn Hake, HOYT Weeks in Treatment: 100 History of Present Illness HPI Description: 68 year old patient here to see as for bilateral feet ulceration to on his left first and second toe and 2 on his right first and second toe, which she's had for about 4 months. 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 Donella Stade. The patient is also undergoing workup by medical oncology for a intra-abdominal lymphadenopathy. 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 He is also status post appendectomy, inguinal lymph node biopsy, rectal biopsy and  rectal examination under anesthesia. he currently smokes cigarettes about half packet a day. In March of this year he was seen by Dr. Hortencia Pilar, for evaluation of bilateral varicose veins and besides wearing compression stockings he had recommended laser ablation of the right and left great saphenous veins to eleviate the symptoms and complications of severe superficial venous reflux disease. He also recommended lymphedema pumps for better control of his lymphedema. The patient recently has had on 08/23/2016, right greater saphenous vein ablation with the laser energy Earlier lower extremity venous reflux examination done on 05/08/2016 showed no DVT or SVT both lower legs but incompetence of bilateral great saphenous veins was present. A lower arterial study was also done and there was no significant right lower and left lower extremity problems based on a normal toe brachial index bilaterally and the ABI was 1.21 the left and 1.23 on the right. His post ablation venous duplex examination showed successful ablation of the right GS vein with thrombus formation 2 below the right saphenofemoral junction. The deep system was patent without evidence of thrombosis and this was done on 08/30/2016. 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 Addendum: regarding his x-rays done today and x-ray of the left foot -- IMPRESSION: No objective evidence of osteomyelitis. There are soft tissue changes which may reflect cellulitis. X-ray of the right foot -- IMPRESSION:Findings compatible with cellulitis of the toes. No objective evidence of osteomyelitis is observed. 10/01/16 on evaluation today patient's wounds appeared to be doing some better. I did review the x-rays as well which showed no evidence of osteomyelitis although there was evidence on x-ray of cellulitis. He fortunately is not having  any discomfort although he continues to have some swelling. He does not remember being on any antibiotics  recently. 10/15/16 on evaluation today patient's wounds overall appear to be doing better although he does have a new location noted on the left foot. Fortunately he is not having significant pain. It almost has the appearance that something is rubbing on the end of his toes but he wears the open toe shoes and according to what he is telling me never wears anything that would rub on his foot. There is no evidence of infection and specifically no evidence of a fungal infection 10/22/16 On evaluation today patient's wounds appeared to be doing better compared to last week in regard to his bilateral lower extremities. Fortunately I happy with how things are progressing although he still has ulcers I feel like that he is improving and appropriate manner. 11/12/16 on evaluation today patient appears to be doing well in regard to his bilateral feet and the respective wounds. We have been using surrounding her dressings along with an antifungal cream which seems to be doing very well. He has no bilateral dysfunction noticed that the rituals are weight loss at this point. He also has no nausea or vomiting a note purulent discharge. He did see Vein and vascular today and he tells me that they told him he could have surgery for his venous stasis but they did not feel like it was worth it in his words. Fortunately patient's wounds do appear to be getting sneakily better. 11/26/2016 -- he says he is going to have some surgery during this week at [facility] for possibly a colon resection. 12/31/2016 -- the patient has been noncompliant with his smoking and I'm not sure whether he is also started drinking again. He continues to be very nonchalant about his care 01/14/2017 -- the patient's HandP has been reviewed well and I understand he is being compliant with trying to give up smoking and his local dressing  changes. He does not have any surgical options of 4 to him by his vascular surgeons.he was last seen in early August by Dr. Hortencia Pilar who recommended compression stockings,and possibly lymph pumps in 2-3 months [name], [name] ([id number]) after doing a review ultrasound. 01/28/2017 - the patient did not have any fresh complaints but on examination I noted a large lacerated wound on the plantar aspect of his right fourth toe which had a lot of necrotic debris and it probes down to bone. 02/07/2017 -- x-ray of the right foot -- IMPRESSION: Soft tissue swelling about the first through fourth toes consistent with cellulitis. New destructive change in the tuft of the distal phalanx of the great toe is consistent with osteomyelitis. 02/14/2017 -- the patient's MRI is pending this coming Monday and he still continues to smoke. We have again gone over off loading of his wounds in great detail and he says he's been compliant. 02/21/2017 -- MR of the right foot -- IMPRESSION: 1. Soft tissue ulcer at the tip of the first, second and third toe knows. Cortical irregularity and bone marrow edema in the first distal phalanx most concerning for osteomyelitis. Mild marrow edema in the second and third distal phalanx without definite cortical destruction which may reflect early osteomyelitis versus reactive marrow edema. 2. Soft tissue edema surrounding the first phalanx most consistent with cellulitis. the patient was also recently evaluated by his medical oncologist Dr. 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 She is treating him with weekly Procrit. She is also keeping intra-abdominal lymphadenopathy and right lower  lobe lung nodule under observation. 04/04/2017 -- he was seen by Dr. Adrian Prows on 03/25/2017 -- after review he empirically put him on ciprofloxacin and doxycycline as they have good bone penetration and good  bioavailability and it will cover the usual pathogens and diabetic foot osteomyelitis. He will check inflammatory markers and plan a 14-23 week old records. C-reactive protein was 0.3 and the ESR was 72 04/18/17 on evaluation today patient appears to be doing about the same in regard to his lower extremity wounds bilaterally. He has continued to use the antifungal cream which does seem to be beneficial. Nonetheless the ulcers do seem to in some areas be epithelial eyes over and in other areas are still open. He is having no significant discomfort. 04/25/17-he is here in follow-up evaluation for multiple ulcerations to multiple toes bilaterally. He states he did see Dr. Ola Spurr again last week and continues antibiotic therapy. He is voicing no complaints or concerns, will continue with current treatment plan will possibility of adding compression therapy next week after an additional week of treatment/lotions to BLE prescribed by Dr Ola Spurr 05/02/17 he is here in follow up for for multiple ulcers to multiple toes bilaterally. we will stop using antifungal cream and will continue with silvercel and follow up next week 05/09/17-he is here in follow-up for multiple ulcerations to multiple toes bilaterally. There is improvement in appearance. He has not completely stopped using antifungal cream, but admits he has not using it between the toes. He has an appointment with Dr. Ola Spurr on 2/11, continues on doxycycline and Cipro. It has been 5 weeks of antibiotic therapy, we will order plain film xray to evaluate for osteomyelitis next week, prior to follow up with ID. Will continue with silvercel and follow up next week 05/16/17-he is here in follow-up evaluation for multiple ulcerations to multiple toes bilaterally and new wound to the right posterior heel. There is essentially no change in appearance, deteriorating measurements; he has a history of waxing and waning measurements. He admits that he  continues to apply moisturizer/cream/ointment to his toes despite weekly reminders to only apply silvercel to his toes. He states that he thinks the surgical shoe contributed to the superficial ulcer to his posterior heel, he is unable to articulate if this was an area of dry cracked skin as he has a similar area to the left heel. He now is wearing open toed slippers. He has an appointment with Dr. Ola Spurr on 2/11. We have ordered x-rays for her bilateral feet; he was advised to obtain the x-rays today or tomorrow. He will follow-up next week 05/23/17-he is here in follow-up evaluation for multiple ulcerations to multiple toes bilaterally and the right posterior heel. There is improvement in maceration. He has been compliant and not applying any moisturizing agent to his toes. He has been using Lac-Hydrin for his lower extremities with improvement. He did not go to his appointment on Monday with Dr. Ola Spurr secondary to financial concerns. X-rays for her bilateral feet showed: LEFT FOOT with slight erosion of the tuft of the distal phalanges of the left first and second toe suspicious for osteomyelitis, RIGHT FOOT with 1.erosion of the tufts of the distal phalanges of the right first second and possibly third toes consistent with osteomyelitis, 2 no definitive abnormality of the calcaneus is seen on the images obtained, 3. Plantar calcaneal degenerative spur. We briefly discussed hyperbaric adjunctive therapy for treatment of chronic refractory osteomyelitis. I do not find an a1c in EMR, will contact PCP  for record, or order if needed. He has been encouraged to contact Dr Ola Spurr office regarding the follow-up appointment, encouraged him to inquire about payment plan. We will continue with same treatment plan and follow-up next week. He states he is still taking antibiotics and has "a lot" left. He states he has been taking them as directed, 2 pills twice daily. According to Dr. Blane Ohara  office notes he was originally started on 12/17 for 4 weeks and extended on 1/14 for an additional 4 weeks. He should be done with his antibiotic therapy, he was advised to bring his bottles and to his next appointment, we will contact pharmacy. 05/30/17-he is here in follow-up evaluation for multiple ulcerations to multiple toes bilaterally and the right posterior heel. He is accompanied by his brother-in-law. Wounds are stable. He has yet to make up with Dr. Ola Spurr. We contacted his PCP, with no record of recent A1c we will draw an A1c. His brother-in-law states that he was taken off all of his diabetic medication secondary to kidney function. 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 The antibiotics were counted and have approximately 2 weeks left, although they should be complete. The pill organizer reveals missing days. We discussed the need for consistent medications, to have optimal benefit of medication. He has a cousin that lives with him and he will ask her to check his organizer daily. He has [name], [name] ([id number]) been advised to follow up with Dr Ola Spurr, and will go by the office today. He has been advised to quit smoking. 06/06/17-he is here in follow up evaluation. He has had to make an appointment with Dr. Ola Spurr. He did have blood work obtained, a1c 5. He continues to take antibiotic therapy. Significant improvement in bilateral lower extremity edema with compression therapy. Essentially no change in ulcerations to toes. He states he is "going to try something different" and "let me know next week" if it works; he would not provide any additional information and was encouraged to follow our orders. We will follow up next week 06/13/17-he is here in follow-up evaluation. He has an appointment with Dr. Ola Spurr tomorrow morning. He states he purchased an ointment from  [facility] and applied to his toes for 3 days, he does not remember the name of the ointment. There is improvement to his wounds, minimal maceration. He continues to take antibiotic therapy, this should have been completed last month. His brother-in-law who regularly accompanies his appointments was asked to take the bottles to the appointment tomorrow with Dr. Ola Spurr so he is aware. We will continue with 3 layer compression, and order OPEN TOE compression 20-30mmHg; we will apply compression stockings next week. He continues to smoke, smoked "2 cigarettes" last week 06/20/17 on evaluation today patient did receive his compression stockings which he has with him today for both lower extremities. With that being said he tells me at this point in time that he is very happy to have these he really is not a big fan of the compression wraps that we have been utilizing although they have been of great benefit for him. Nonetheless at this point he does want to switch to the compression stockings. In my opinion as long as he is continuing with compression I'm okay with the stockings or the wraps. 06/27/17-he is here in follow-up evaluation for multiple ulcerations to his bilateral toes. There is some improvement in appearance. He is compliant  in wearing his compression stockings with significant improvement in lower extremity edema. He saw Dr Ola Spurr on 3/8, per his notes they would redraw ESR and CRP; plan to continue antibiotic therapy if these remain elevated. I do not see an ESR or CRP level in Epic. The patient continues to take antibiotics. 07/11/17-He is here in follow-up evaluation for multiple ulcerations to multiple toes bilaterally. He presents with complete epithelialization to the right third toe; there has been no deterioration. He continues on antibiotic therapy. He will follow-up next week 07/18/17-He is here in follow-up evaluation for multiple ulcerations to multiple toes bilaterally. He  continues to make improvement. He continues on antibiotic therapy. He states he has been using something additional to our orders, he does not elaborate but states he will bring it in next week. 07/25/17-He is here in follow up evaluation for multiple ulcerations to bilateral toes. He is stable. He has completed antibiotic therapy. He admits to "filing" his toes after showers each evening, this is what he was referencing last week; he does not filing for the wounds. We will switch to [facility] and monitor for any improvement, he will follow-up next week 08/01/17-He is here in follow-up evaluation. He admits to "picking" at his toes after cleansing yesterday, leading to new areas of tissue loss on the bilateral second toe. There is improvement noted to the bilateral great toe. We will dress toes today and hope that they maintain until Monday where he will come in for a nurse visit. He has been advised, multiple times with expressed verbalization, to change the dressings to silvercel if the dressings get wet prior to [facility] appointment. 08/08/17-He is here in follow-up evaluation for bilateral first and second toe ulcerations. There is significant improvement to all ulcerations since last visit. We will switch to [facility] to all wounds and he will continue with nurse visits on a Monday/Thursday schedule and follow-up with me in 2 weeks. He continues to smoke, 1-3 cigarettes per day, and has been encouraged to not smoke until his next follow-up in 2 weeks. 08/15/17 on evaluation today patient's ulcers on his toes actually appear to be doing fairly well the right to ulcers may be a little bit more moist compared to the left I'm not really sure exactly why as the openings appear to be very small and I'm not seeing any evidence of anything significant as far as you lightest or otherwise. Nonetheless this may just be a small setback he's been doing very well with the [facility]  Dressing 08/22/17-He is here in follow-up evaluation for ulcerations to his bilateral first and second toes, there is small/scant amount of drainage noted on today's dressing. He continues with Hydrofera Blue. He continues to smoke, 1 cigarette a day. Voices no complaint or concerns, compliant and compression therapy today. He'll follow-up next week 08/29/17-He is here in follow-up evaluation for ulcerations to his bilateral first and second toes, with a new wound to the left third toe. He states he cut himself while cutting the toenail. He presents today with more maceration and increased measurements; waxing and waning measurements and moisture has been an ongoing issue. He continues to smoke a proximally 1 cigarette per day, but states he has not smoked since Sunday. We will initiate medihoney daily and evaluate next week. He presents today without compression stockings 09/05/17-He is here in follow-up evaluation for ulcerations to bilateral first and second and left third toe. He did not pick up the [place] and therefore has  not been changing his dressing. He admits to inconsistent where of compression stockings, admits to pain to his bilateral lower extremities with unilateral edema (right greater than left). The ulcerations to multiple toes are more macerated and larger in size than last week. He is wearing compression therapy today. He admits to an overall feeling of weakness and fatigue and has been encouraged to contact his PCP for evaluation; he has a known history of anemia and intraabdominal lymphadenopathy. We will return to silvercel and he will follow up next week 09/19/17-He is here in follow-up evaluation for ulcerations to bilateral first and second toe.he was unable to make last week's appointment. The culture that was obtained on 5/30 grew clindamycin sensitive MRSA; he was initiated on 6/4 but did not start it until 6/7. There is significant improvement in all wounds, healing to  left second and third toe. He also admits to having no alcohol or smoking for the last 6 days. We will continue with same treatment plan I will extend the clindamycin for an additional 10 days and he will follow-up next week. 09/26/17- He is here in follow evaluation for multiple ulcerations to multiple toes bilaterally. He continues to be non-compliant with dressing, compression therapy. He states he is taking the antibiotics as prescribed, although his recall is inconsistent. There is noted deterioration in all ulcers. We will continue same treatment plan, antibiotic therapy and he will follow up next week. Of note, he recently had a biopsy to a lesion to his buttock, in the same location of previously radiated scc; biopsy Hegler, Labaron E. 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 This information is obtained from the medical record, as he cannot articulate specifics to his diagnosis or treatment plan. 10/17/17-He is here in follow-up evaluation for multiple ulcerations to multiple toes bilaterally. There is chronic waxing and waning improvement/deterioration. On today's exam they appear improved, dry without maceration. He continues to apply a variety of topical agents, stating that he has switched between the blue product and silver product. In my opinion, this will be a chronic waxing and waning giving his intermittent compliance. He will follow-up in 2 weeks. As it relates to the biopsy taken from his buttock, he has not followed up with that and has not started radiation therapy. 10/31/17-He is here for evaluation for multiple ulcerations to multiple toes bilaterally. Continues with waxing and waning improvement versus deterioration. They are stable on today's exam without significant maceration. 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 We will continue with every 2 week follow-up  appointments 11/14/17- He is seen in follow-up evaluation for multiple ulcerations to multiple bilateral toes. He is scheduled for excision of basal cell carcinoma to his right buttock on Monday 8/12. He continues to have chronic waxing and waning of his ulcers; today is a stable day with minimal drainage and left ear is better than right toes. I will recount to infectious disease regarding chronic suppressive therapy. He admits to being more compliant with compression stockings, although he is not wearing any today. He has been encouraged to continue with dressing changes as ordered and compression therapy daily. He will be seen in 2 weeks 11/28/17-He is seen in follow up evaluation for multiple ulcerations to multiple toes bilaterally. He had an overnight admission at [facility] (8/11-8/12) for symptomatic anemia, received two units prbc. His surgical date has been moved to 9/94 excision of basal cell carcinoma to  the right buttock. He has been noncompliant in wearing compression stockings, applying topical treatment as ordered; he has been applying a "cream", Hydrofera Blue, silvercel. He is currently on no antibiotic therapy; we will send for re-referral to ID for suppressive therapy given his multiple co-morbidities, poor surgical candidate, unwilling to have toe amputation and general non-adherence. 12/19/17 on evaluation today patient actually appears to be doing very well in regard to his toes at least compared to last time I saw him. He again has been coming to our office for quite a bit of time. 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 He states is hurting a little bit but doing well in general. I'm see were not taking care of this area dermatology is. He does have his appointment with infectious disease upcoming which they re-consult for suppressive therapy that's next  Monday. 01/02/18 on evaluation today patient actually appears to be doing rather well in regard to his toe ulcers although he tells me he has been put in an ointment on this which I am concerned may be causing some additional moisture buildup which we really do not want. He supposed be using silver cell which is mainly thing I want him to be utilizing. Fortunately there does not appear to be any evidence of worsening infection which is good news. He has had surgery on his gluteal region where he had an excision of a cancerous area fortunately that seems to be doing well although he is having some discomfort. 02/06/18 on evaluation today patient actually appears to be doing well at all locations except for his second toe use bilaterally where the distal tip is still wrong open. He did state that he can't stand for dry skin to buildup on his toes subsequently as a question and further it sounds as if he actually pills this all which is likely keeping the toe is wrong and open. Also think that's what happened in the bottom of his foot although that appears to have healed at this point. There does not appear to be any evidence of infection. 02/20/18 on evaluation today patient appears to be doing better in regard to his foot ulcers. Everything has shown signs of improvement which is excellent news. Overall I'm very pleased with how things appear currently. The patient states he's not picking her point at any skin and that he wears shoes at all times when he is ambulating at home. Both are things that I've advocated and recommended for him in the past. 03/13/18 on evaluation today patient actually appears to be doing very well in regard to the bilateral toes in the overall appearance of the wound bed locations. Fortunately there does not appear to be any signs of infection at this time. He states that he is very pleased with how things seem to be progressing to be honest as am I. 03/27/1899 evaluation  today patient appears to be doing well in regard to his toe ulcers. He just has a few areas remaining and these appear to be showing signs of improvement as well which is great news. Overall I have been impressed with the progress of the past several weeks. 04/10/18 on evaluation today patient appears to be doing a little bit worse in regard to the right plantar foot at the regional the first metatarsal as well as the left second toe. He tells me that he ran out of  bandages he also tells me he's been wearing his flip-flops a lot at home as well as a new pair of boots that he received as well. This is as opposed to the postop shoes that we had previously given him. Nonetheless overall I feel like that he has had a little bit more friction to the area which is causing things to look a little bit worse today. 04/24/18 on evaluation today patient's wounds for the most part appear to be doing excellent especially in regard to the bilateral second toes. With that being said he does have issues with the wound still on the first metatarsal region and first toe of the right foot which have not completely closed at this point. Nonetheless I do feel like that he is making progress which is good news. 05/08/18 on evaluation today patient's wounds appear to be doing just a little bit worse than normal simply due to the fact that he has not had dressing supplies. He apparently owed $35 in order for them to be shipped and unfortunately is not able to pay that currently. We did give him information for the [facility] today which will hopefully help cover his Bruso, Gonzalo E. ([id number]) dressing supplies. He was thankful for this he states is gonna contact them this afternoon. Fortunately otherwise or does not hear any signs of infection. 05/22/18 on evaluation today patient appears to be doing somewhat poorly in regard to his toes. Upon further questioning it appears that he actually has been pulling  skin off of the toes which "drive him crazy". Subsequently as he does this he calls his new areas to tear open and I think this is one of the reasons why his toes are not healing as appropriately as we would like. He fortunately has no signs of infection at this time. With that being said I'm still concerned about the possibility of a fungal infection even though there doesn't appear to be anything bacterial. Potentially nystatin powder could be of benefit for him as far as trying to help this in that regard. 06/05/18 on evaluation today patient actually appears to be doing very well in regard to his foot ulcers. Fortunately there's no signs of infection. He's been tolerating the dressing changes without complication. I'm very pleased with how things seem to be progressing. Fortunately there's no evidence of active infection at this point I do believe nystatin powder has been of benefit for him. 06/19/18 on evaluation today patient actually appears to be doing a little worse compared to last evaluation. Tells me that he has not had any supplies for two weeks. With that being said his plantar foot wounds on the right actually appear to be doing very well. It's actually a second toes in the first toes that are the worst. I think these are areas the was able to get to and pick and he tells me in fact that he destine to pick out some of the dry skin because it causes him annoyance. Nonetheless I think this is somewhat detrimental to him I think it's worth keeping part of the wound open in particular. Fortunately there is no sign of infection at this time although I think that is a risk which I discussed specifically that he's not careful you can end up reopening this and causing greater problems for himself. 07/03/18 on evaluation today patient's wound bed actually shows evidence of good granulation at this time and epithelialization as far as the wounds in general are concerned. He  has been tolerating the  dressing changes including the nystatin without complication. This is good news. With that being said he seems to be still having some trouble with the left second toe distally at this point. 4/9; most of the wounds look quite satisfactory. He requires debridement of the plantar right great toe otherwise everything looks superficial to improved. His left second toe is healed. 08/14/18 on evaluation today patient actually appears to be doing very well in regard to his foot ulcerations. In fact he just has three small areas of the right foot still remaining open the left foot is closed and dry at this point. Overall I feel like he is doing quite well which is excellent news. Fortunately there's no signs of active infection. 08/28/18 on evaluation today patient's toes of the right foot actually shows signs of being a little bit worse in regard to the overall open areas at this point. He does tell me that he "washes them well" when he is taking a shower. Again I explained that I want to make sure he's not washing them to aggressively as this could potentially cause issues with overall worsening. Electronic Signature(s) Signed: 08/28/2018 5:22:21 PM By: Worthy Keeler PA-C Entered By: Worthy Keeler on 08/28/2018 09:54:32 Choung, [name] ([id number]) -------------------------------------------------------------------------------- Physical Exam Details Patient Name: Nowaczyk, Jet E. Date of Service: 08/28/2018 8:15 AM Medical Record Number: [id number] Patient Account Number: [id number] Date of Birth/Sex: 05/16/1950 (68 y.o. M) Treating RN: Cornell Barman Primary Care Provider: Lamonte Sakai Other Clinician: Referring Provider: Lamonte Sakai Treating Provider/Extender: Melburn Hake, HOYT Weeks in Treatment: 100 Constitutional Well-nourished and well-hydrated in no acute distress. Respiratory normal breathing without difficulty. clear to auscultation bilaterally. Cardiovascular regular rate and rhythm with  normal S1, S2. Psychiatric this patient is able to make decisions and demonstrates good insight into disease process. Alert and Oriented x 3. pleasant and cooperative. Notes Patient's wound bed currently do appear to be somewhat macerated and wet I do think is a lot more dreams last time I saw him. I think he needs to be very careful with how aggressively he washes the area and I did discuss this with him yet again today. No fevers, chills, nausea, or vomiting noted at this time. Electronic Signature(s) Signed: 08/28/2018 5:22:21 PM By: Worthy Keeler PA-C Entered By: Worthy Keeler on 08/28/2018 09:54:59 [name], [name] ([id number]) -------------------------------------------------------------------------------- Physician Orders Details Patient Name: Markwardt, Kasheem E. Date of Service: 08/28/2018 8:15 AM Medical Record Number: [id number] Patient Account Number: [id number] Date of Birth/Sex: [birth year] (68 y.o. M) Treating RN: Montey Hora Primary Care Provider: Lamonte Sakai Other Clinician: Referring Provider: Lamonte Sakai Treating Provider/Extender: Melburn Hake, HOYT Weeks in Treatment: 100 Verbal / Phone Orders: No Diagnosis Coding ICD-10 Coding Code Description L97.521 Non-pressure chronic ulcer of other part of left foot limited to breakdown of skin L97.511 Non-pressure chronic ulcer of other part of right foot limited to breakdown of skin E11.621 Type 2 diabetes mellitus with foot ulcer I87.323 Chronic venous hypertension (idiopathic) with inflammation of bilateral lower extremity I89.0 Lymphedema, not elsewhere classified F17.218 Nicotine dependence, cigarettes, 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 abuse 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, right ankle and foot Wound Cleansing Wound #16 Right,Plantar Toe Great o Clean wound with Normal Saline. Wound #18 Right,Dorsal Toe Great o Clean wound with Normal  Saline. Wound #2 Right Toe Second o Clean wound with Normal Saline. Anesthetic (add to Medication List) Wound #16 Right,Plantar Toe Great o Topical Lidocaine 4%  cream applied to wound bed prior to debridement (In Clinic Only). Wound #18 Right,Dorsal Toe Great o Topical Lidocaine 4% cream applied to wound bed prior to debridement (In Clinic Only). Wound #2 Right Toe Second o Topical Lidocaine 4% cream applied to wound bed prior to debridement (In Clinic Only). Skin Barriers/Peri-Wound Care Wound #16 Right,Plantar Toe Great o Antifungal powder-Nystatin Wound #18 Right,Dorsal Toe Great o Antifungal powder-Nystatin Wound #2 Right Toe Second o Antifungal powder-Nystatin Primary Wound Dressing Wound #16 Right,Plantar Toe Great o Silver Alginate - secure with tape Wound #18 Right,Dorsal Toe Great o Silver Alginate - secure with tape Wound #2 Right Toe Second o Silver Alginate - secure with tape [name], [name] ([id number]) Dressing Change Frequency Wound #16 Right,Plantar Toe Great o Change dressing every other day. Wound #18 Right,Dorsal Toe Great o Change dressing every other day. Wound #2 Right Toe Second o Change dressing every other day. Follow-up Appointments Wound #16 Right,Plantar Toe Great o Return Appointment in 2 weeks. Wound #18 Right,Dorsal Toe Great o Return Appointment in 2 weeks. Wound #2 Right Toe Second o Return Appointment in 2 weeks. Edema Control Wound #16 Right,Plantar Toe Great o Patient to wear own compression stockings Wound #18 Right,Dorsal Toe Great o Patient to wear own compression stockings Wound #2 Right Toe Second o Patient to wear own compression stockings Additional Orders / Instructions Wound #16 Right,Plantar Toe Great o Stop Smoking o Increase protein intake. Wound #18 Right,Dorsal Toe Great o Stop Smoking o Increase protein intake. Wound #2 Right Toe Second o Stop Smoking o Increase  protein intake. Medications-please add to medication list. Wound #16 Right,Plantar Toe Great o Other: - Nystatin Powder Wound #18 Right,Dorsal Toe Great o Other: - Nystatin Powder Wound #2 Right Toe Second o Other: - Nystatin Powder Electronic Signature(s) Signed: 08/28/2018 4:20:47 PM By: Montey Hora Signed: 08/28/2018 5:22:21 PM By: Worthy Keeler PA-C Entered By: Montey Hora on 08/28/2018 09:16:08 [name], [name] ([id number]) -------------------------------------------------------------------------------- Problem List Details Patient Name: Heyde, Christifer E. Date of Service: 08/28/2018 8:15 AM Medical Record Number: [id number] Patient Account Number: [id number] Date of Birth/Sex: 08-Aug-1950 (68 y.o. M) Treating RN: Cornell Barman Primary Care Provider: Lamonte Sakai Other Clinician: Referring Provider: Lamonte Sakai Treating Provider/Extender: Melburn Hake, HOYT Weeks in Treatment: 100 Active Problems ICD-10 Evaluated Encounter Code Description Active Date Today Diagnosis L97.521 Non-pressure chronic ulcer of other part of left foot limited to 09/21/2016 No Yes breakdown of skin L97.511 Non-pressure chronic ulcer of other part of right foot limited to 09/21/2016 No Yes breakdown of skin E11.621 Type 2 diabetes mellitus with foot ulcer 09/21/2016 No Yes I87.323 Chronic venous hypertension (idiopathic) with inflammation of 09/21/2016 No Yes bilateral lower extremity I89.0 Lymphedema, not elsewhere classified 09/21/2016 No Yes F17.218 Nicotine dependence, cigarettes, with other nicotine-induced 09/21/2016 No Yes disorders F10.19 Alcohol abuse with unspecified alcohol-induced disorder 09/21/2016 No Yes M86.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, right ankle and foot 02/21/2017 No Yes Inactive Problems Resolved Problems Electronic Signature(s) Signed: 08/28/2018 5:22:21 PM By: Worthy Keeler PA-C Entered By: Worthy Keeler on 08/28/2018 08:58:24 [name], [name] EMarland Kitchen  ([id number]) -------------------------------------------------------------------------------- Progress Note Details Patient Name: Koeller, [name] Date of Service: 08/28/2018 8:15 AM Medical Record Number: [id number] Patient Account Number: [id number] Date of Birth/Sex: June 16, 1950 (68 y.o. M) Treating RN: Cornell Barman Primary Care Provider: Lamonte Sakai Other Clinician: Referring Provider: Lamonte Sakai Treating Provider/Extender: Melburn Hake, HOYT Weeks in Treatment: 100 Subjective Chief Complaint Information obtained from Patient Patient presents for treatment of an open diabetic ulcer to  both feet History of Present Illness (HPI) 68 year old patient here to see as for bilateral feet ulceration to on his left first and second toe and 2 on his right first and second toe, which she's had for about 4 months. 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 Donella Stade. The patient is also undergoing workup by medical oncology for a intra-abdominal lymphadenopathy. 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 He is also status post appendectomy, inguinal lymph node biopsy, rectal biopsy and rectal examination under anesthesia. he currently smokes cigarettes about half packet a day. In March of this year he was seen by Dr. Hortencia Pilar, for evaluation of bilateral varicose veins and besides wearing compression stockings he had recommended laser ablation of the right and left great saphenous veins to eleviate the symptoms and complications of severe superficial venous reflux disease. He also recommended lymphedema pumps for better control of his lymphedema. The patient recently has had on 08/23/2016, right greater saphenous vein ablation with the laser  energy Earlier lower extremity venous reflux examination done on 05/08/2016 showed no DVT or SVT both lower legs but incompetence of bilateral great saphenous veins was present. A lower arterial study was also done and there was no significant right lower and left lower extremity problems based on a normal toe brachial index bilaterally and the ABI was 1.21 the left and 1.23 on the right. His post ablation venous duplex examination showed successful ablation of the right GS vein with thrombus formation 2 below the right saphenofemoral junction. The deep system was patent without evidence of thrombosis and this was done on 08/30/2016. 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 Addendum: regarding his x-rays done today and x-ray of the left foot -- IMPRESSION: No objective evidence of osteomyelitis. There are soft tissue changes which may reflect cellulitis. X-ray of the right foot -- IMPRESSION:Findings compatible with cellulitis of the toes. No objective evidence of osteomyelitis is observed. 10/01/16 on evaluation today patient's wounds appeared to be doing some better. I did review the x-rays as well which showed no evidence of osteomyelitis although there was evidence on x-ray of cellulitis. He fortunately is not having any discomfort although he continues to have some swelling. He does not remember being on any antibiotics recently. 10/15/16 on evaluation today patient's wounds overall appear to be doing better although he does have a new location noted on the left foot. Fortunately he is not having significant pain. It almost has the appearance that something is rubbing on the end of his toes but he wears the open toe shoes and according to what he is telling me never wears anything that would rub on his foot. There is no evidence of infection and specifically no evidence of a fungal  infection 10/22/16 On evaluation today patient's wounds appeared to be doing better compared to last week in regard to his bilateral lower extremities. Fortunately I happy with how things are progressing although he still has ulcers I feel like that he is improving and appropriate manner. 11/12/16 on evaluation today patient appears to be doing well in regard to his bilateral feet and the respective wounds. We have been using surrounding  her dressings along with an antifungal cream which seems to be doing very well. He has no bilateral dysfunction noticed that the rituals are weight loss at this point. He also has no nausea or vomiting a note purulent discharge. He did see Vein and vascular today and he tells me that they told him he could have surgery for his venous stasis but they did not feel like it was worth it in his words. Fortunately patient's wounds do appear to be getting sneakily better. 11/26/2016 -- he says he is going to have some surgery during this week at [facility], [place] for possibly a colon resection. KRISHAN, MCBREEN ([id number]) 12/31/2016 -- the patient has been noncompliant with his smoking and I'm not sure whether he is also started drinking again. He continues to be very nonchalant about his care 01/14/2017 -- the patient's HandP has been reviewed well and I understand he is being compliant with trying to give up smoking and his local dressing changes. He does not have any surgical options of 4 to him by his vascular surgeons.he was last seen in early August by Dr. Hortencia Pilar who recommended compression stockings,and possibly lymph pumps in 2-3 months after doing a review ultrasound. 01/28/2017 - the patient did not have any fresh complaints but on examination I noted a large lacerated wound on the plantar aspect of his right fourth toe which had a lot of necrotic debris and it probes down to bone. 02/07/2017 -- x-ray of the right foot -- IMPRESSION: Soft tissue swelling  about the first through fourth toes consistent with cellulitis. New destructive change in the tuft of the distal phalanx of the great toe is consistent with osteomyelitis. 02/14/2017 -- the patient's MRI is pending this coming Monday and he still continues to smoke. We have again gone over off loading of his wounds in great detail and he says he's been compliant. 02/21/2017 -- MR of the right foot -- IMPRESSION: 1. Soft tissue ulcer at the tip of the first, second and third toe knows. Cortical irregularity and bone marrow edema in the first distal phalanx most concerning for osteomyelitis. Mild marrow edema in the second and third distal phalanx without definite cortical destruction which may reflect early osteomyelitis versus reactive marrow edema. 2. Soft tissue edema surrounding the first phalanx most consistent with cellulitis. the patient was also recently evaluated by his medical oncologist Dr. 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 She is treating him with weekly Procrit. She is also keeping intra-abdominal lymphadenopathy and right lower lobe lung nodule under observation. 04/04/2017 -- he was seen by Dr. Adrian Prows on 03/25/2017 -- after review he empirically put him on ciprofloxacin and doxycycline as they have good bone penetration and good bioavailability and it will cover the usual pathogens and diabetic foot osteomyelitis. He will check inflammatory markers and plan a 66-27 week old records. C-reactive protein was 0.3 and the ESR was 72 04/18/17 on evaluation today patient appears to be doing about the same in regard to his lower extremity wounds bilaterally. He has continued to use the antifungal cream which does seem to be beneficial. Nonetheless the ulcers do seem to in some areas be epithelial eyes over and in other areas are still open. He is having no significant discomfort. 04/25/17-he is here in  follow-up evaluation for multiple ulcerations to multiple toes bilaterally. He states he did see Dr. Ola Spurr again last week and continues antibiotic  therapy. He is voicing no complaints or concerns, will continue with current treatment plan will possibility of adding compression therapy next week after an additional week of treatment/lotions to BLE prescribed by Dr Ola Spurr 05/02/17 he is here in follow up for for multiple ulcers to multiple toes bilaterally. we will stop using antifungal cream and will continue with silvercel and follow up next week 05/09/17-he is here in follow-up for multiple ulcerations to multiple toes bilaterally. There is improvement in appearance. He has not completely stopped using antifungal cream, but admits he has not using it between the toes. He has an appointment with Dr. Ola Spurr on 2/11, continues on doxycycline and Cipro. It has been 5 weeks of antibiotic therapy, we will order plain film xray to evaluate for osteomyelitis next week, prior to follow up with ID. Will continue with silvercel and follow up next week 05/16/17-he is here in follow-up evaluation for multiple ulcerations to multiple toes bilaterally and new wound to the right posterior heel. There is essentially no change in appearance, deteriorating measurements; he has a history of waxing and waning measurements. He admits that he continues to apply moisturizer/cream/ointment to his toes despite weekly reminders to only apply silvercel to his toes. He states that he thinks the surgical shoe contributed to the superficial ulcer to his posterior heel, he is unable to articulate if this was an area of dry cracked skin as he has a similar area to the left heel. He now is wearing open toed slippers. He has an appointment with Dr. Ola Spurr on 2/11. We have ordered x-rays for her bilateral feet; he was advised to obtain the x-rays today or tomorrow. He will follow-up next week 05/23/17-he is here in  follow-up evaluation for multiple ulcerations to multiple toes bilaterally and the right posterior heel. There is improvement in maceration. He has been compliant and not applying any moisturizing agent to his toes. He has been using Lac-Hydrin for his lower extremities with improvement. He did not go to his appointment on Monday with Dr. Ola Spurr secondary to financial concerns. X-rays for her bilateral feet showed: LEFT FOOT with slight erosion of the tuft of the distal phalanges of the left first and second toe suspicious for osteomyelitis, RIGHT FOOT with 1.erosion of the tufts of the distal phalanges of the right first second and possibly third toes consistent with osteomyelitis, 2 no definitive abnormality of the calcaneus is seen on the images obtained, 3. Plantar calcaneal degenerative spur. We briefly discussed hyperbaric adjunctive therapy for treatment of chronic refractory osteomyelitis. I do not find an a1c in EMR, will contact PCP for record, or order if needed. He has been encouraged to contact Dr Ola Spurr office regarding the follow-up appointment, encouraged him to inquire about payment plan. We will continue with same treatment plan and follow-up next week. He states he is still taking antibiotics and has "a lot" left. He states he has been taking them as directed, 2 pills twice daily. According to Dr. Blane Ohara office notes he was originally started on 12/17 for 4 weeks and extended on 1/14 for an additional 4 weeks. He should be done with his antibiotic therapy, he was advised to bring his bottles and to his next appointment, we will contact pharmacy. 05/30/17-he is here in follow-up evaluation for multiple ulcerations to multiple toes bilaterally and the right posterior heel. He is accompanied by his brother-in-law. Wounds are stable. He has yet to make up with Dr. Ola Spurr. We contacted his PCP, Aoun, Randall E. ([id number]) with  no record of recent A1c we will draw an  A1c. His brother-in-law states that he was taken off all of his diabetic medication secondary to kidney function. 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 The antibiotics were counted and have approximately 2 weeks left, although they should be complete. The pill organizer reveals missing days. We discussed the need for consistent medications, to have optimal benefit of medication. He has a cousin that lives with him and he will ask her to check his organizer daily. He has been advised to follow up with Dr Ola Spurr, and will go by the office today. He has been advised to quit smoking. 06/06/17-he is here in follow up evaluation. He has had to make an appointment with Dr. Ola Spurr. He did have blood work obtained, a1c 5. He continues to take antibiotic therapy. Significant improvement in bilateral lower extremity edema with compression therapy. Essentially no change in ulcerations to toes. He states he is "going to try something different" and "let me know next week" if it works; he would not provide any additional information and was encouraged to follow our orders. We will follow up next week 06/13/17-he is here in follow-up evaluation. He has an appointment with Dr. Ola Spurr tomorrow morning. He states he purchased an ointment from [facility] and applied to his toes for 3 days, he does not remember the name of the ointment. There is improvement to his wounds, minimal maceration. He continues to take antibiotic therapy, this should have been completed last month. His brother-in-law who regularly accompanies his appointments was asked to take the bottles to the appointment tomorrow with Dr. Ola Spurr so he is aware. We will continue with 3 layer compression, and order OPEN TOE compression 20-30mmHg; we will apply compression stockings next week. He continues to smoke, smoked "2 cigarettes"  last week 06/20/17 on evaluation today patient did receive his compression stockings which he has with him today for both lower extremities. With that being said he tells me at this point in time that he is very happy to have these he really is not a big fan of the compression wraps that we have been utilizing although they have been of great benefit for him. Nonetheless at this point he does want to switch to the compression stockings. In my opinion as long as he is continuing with compression I'm okay with the stockings or the wraps. 06/27/17-he is here in follow-up evaluation for multiple ulcerations to his bilateral toes. There is some improvement in appearance. He is compliant in wearing his compression stockings with significant improvement in lower extremity edema. He saw Dr Ola Spurr on 3/8, per his notes they would redraw ESR and CRP; plan to continue antibiotic therapy if these remain elevated. I do not see an ESR or CRP level in Epic. The patient continues to take antibiotics. 07/11/17-He is here in follow-up evaluation for multiple ulcerations to multiple toes bilaterally. He presents with complete epithelialization to the right third toe; there has been no deterioration. He continues on antibiotic therapy. He will follow-up next week 07/18/17-He is here in follow-up evaluation for multiple ulcerations to multiple toes bilaterally. He continues to make improvement. He continues on antibiotic therapy. He states he has been using something additional to our orders, he does not elaborate but states he will bring it in next week. 07/25/17-He is here in follow up evaluation for multiple ulcerations to bilateral toes. He is stable. He  has completed antibiotic therapy. He admits to "filing" his toes after showers each evening, this is what he was referencing last week; he does not filing for the wounds. We will switch to [facility] and monitor for any improvement, he will follow-up next  week 08/01/17-He is here in follow-up evaluation. He admits to "picking" at his toes after cleansing yesterday, leading to new areas of tissue loss on the bilateral second toe. There is improvement noted to the bilateral great toe. We will dress toes today and hope that they maintain until Monday where he will come in for a nurse visit. He has been advised, multiple times with expressed verbalization, to change the dressings to silvercel if the dressings get wet prior to [facility] appointment. 08/08/17-He is here in follow-up evaluation for bilateral first and second toe ulcerations. There is significant improvement to all ulcerations since last visit. We will switch to [facility] to all wounds and he will continue with nurse visits on a Monday/Thursday schedule and follow-up with me in 2 weeks. He continues to smoke, 1-3 cigarettes per day, and has been encouraged to not smoke until his next follow-up in 2 weeks. 08/15/17 on evaluation today patient's ulcers on his toes actually appear to be doing fairly well the right to ulcers may be a little bit more moist compared to the left I'm not really sure exactly why as the openings appear to be very small and I'm not seeing any evidence of anything significant as far as you lightest or otherwise. Nonetheless this may just be a small setback he's been doing very well with the [facility] Dressing 08/22/17-He is here in follow-up evaluation for ulcerations to his bilateral first and second toes, there is small/scant amount of drainage noted on today's dressing. He continues with Hydrofera Blue. He continues to smoke, 1 cigarette a day. Voices no complaint or concerns, compliant and compression therapy today. He'll follow-up next week 08/29/17-He is here in follow-up evaluation for ulcerations to his bilateral first and second toes, with a new wound to the left third toe. He states he cut himself while cutting the toenail. He presents today with more  maceration and increased measurements; waxing and waning measurements and moisture has been an ongoing issue. He continues to smoke a proximally 1 cigarette per day, but states he has not smoked since Sunday. We will initiate medihoney daily and evaluate next week. He presents today without compression stockings 09/05/17-He is here in follow-up evaluation for ulcerations to bilateral first and second and left third toe. He did not pick up the medihoney and therefore has not been changing his dressing. He admits to inconsistent where of compression stockings, admits to pain to his bilateral lower extremities with unilateral edema (right greater than left). The ulcerations to multiple toes are more macerated and larger in size than last week. He is wearing compression therapy today. He admits to an overall feeling of weakness and fatigue and has been encouraged to contact his PCP for evaluation; he has a known history of anemia and intraabdominal lymphadenopathy. We will return to silvercel and he will follow up next week 09/19/17-He is here in follow-up evaluation for ulcerations to bilateral first and second toe.he was unable to make last week's appointment. The culture that was obtained on 5/30 grew clindamycin sensitive MRSA; he was initiated on 6/4 but did not start it until 6/7. There is significant improvement in all wounds, healing to left second and third toe. He also admits to having no  alcohol or smoking for the last 6 days. We will continue with same treatment plan I will extend the clindamycin for an [name], [name] ([id number]) additional 10 days and he will follow-up next week. 09/26/17- He is here in follow evaluation for multiple ulcerations to multiple toes bilaterally. He continues to be non-compliant with dressing, compression therapy. He states he is taking the antibiotics as prescribed, although his recall is inconsistent. There is noted deterioration in all ulcers. We will continue  same treatment plan, antibiotic therapy and he will follow up next week. 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 This information is obtained from the medical record, as he cannot articulate specifics to his diagnosis or treatment plan. 10/17/17-He is here in follow-up evaluation for multiple ulcerations to multiple toes bilaterally. There is chronic waxing and waning improvement/deterioration. On today's exam they appear improved, dry without maceration. He continues to apply a variety of topical agents, stating that he has switched between the blue product and silver product. In my opinion, this will be a chronic waxing and waning giving his intermittent compliance. He will follow-up in 2 weeks. As it relates to the biopsy taken from his buttock, he has not followed up with that and has not started radiation therapy. 10/31/17-He is here for evaluation for multiple ulcerations to multiple toes bilaterally. Continues with waxing and waning improvement versus deterioration. They are stable on today's exam without significant maceration. 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 We will continue with every 2 week follow-up appointments 11/14/17- He is seen in follow-up evaluation for multiple ulcerations to multiple bilateral toes. He is scheduled for excision of basal cell carcinoma to his right buttock on Monday 8/12. He continues to have chronic waxing and waning of his ulcers; today is a stable day with minimal drainage and left ear is better than right toes. I will recount to infectious disease regarding chronic suppressive therapy. He admits to being more compliant with compression stockings, although he is not wearing any today. He has been encouraged to continue with dressing changes as  ordered and compression therapy daily. He will be seen in 2 weeks 11/28/17-He is seen in follow up evaluation for multiple ulcerations to multiple toes bilaterally. He had an overnight admission at [facility] (8/11-8/12) for symptomatic anemia, received two units prbc. His surgical date has been moved to 9/94 excision of basal cell carcinoma to the right buttock. He has been noncompliant in wearing compression stockings, applying topical treatment as ordered; he has been applying a "cream", Hydrofera Blue, silvercel. He is currently on no antibiotic therapy; we will send for re-referral to ID for suppressive therapy given his multiple co-morbidities, poor surgical candidate, unwilling to have toe amputation and general non-adherence. 12/19/17 on evaluation today patient actually appears to be doing very well in regard to his toes at least compared to last time I saw him. He again has been coming to our office for quite a bit of time. 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 He states is hurting a little bit but doing well in general. I'm see were not taking care of this area dermatology is. He does have his appointment with infectious disease upcoming  which they re-consult for suppressive therapy that's next Monday. 01/02/18 on evaluation today patient actually appears to be doing rather well in regard to his toe ulcers although he tells me he has been put in an ointment on this which I am concerned may be causing some additional moisture buildup which we really do not want. He supposed be using silver cell which is mainly thing I want him to be utilizing. Fortunately there does not appear to be any evidence of worsening infection which is good news. He has had surgery on his gluteal region where he had an excision of a cancerous area fortunately that seems to be doing well although he  is having some discomfort. 02/06/18 on evaluation today patient actually appears to be doing well at all locations except for his second toe use bilaterally where the distal tip is still wrong open. He did state that he can't stand for dry skin to buildup on his toes subsequently as a question and further it sounds as if he actually pills this all which is likely keeping the toe is wrong and open. Also think that's what happened in the bottom of his foot although that appears to have healed at this point. There does not appear to be any evidence of infection. 02/20/18 on evaluation today patient appears to be doing better in regard to his foot ulcers. Everything has shown signs of improvement which is excellent news. Overall I'm very pleased with how things appear currently. The patient states he's not picking her point at any skin and that he wears shoes at all times when he is ambulating at home. Both are things that I've advocated and recommended for him in the past. 03/13/18 on evaluation today patient actually appears to be doing very well in regard to the bilateral toes in the overall appearance of the wound bed locations. Fortunately there does not appear to be any signs of infection at this time. He states that he is very pleased with how things seem to be progressing to be honest as am I. 03/27/1899 evaluation today patient appears to be doing well in regard to his toe ulcers. He just has a few areas remaining and these appear to be showing signs of improvement as well which is great news. Overall I have been impressed with the progress of the past several weeks. 04/10/18 on evaluation today patient appears to be doing a little bit worse in regard to the right plantar foot at the regional the first metatarsal as well as the left second toe. He tells me that he ran out of bandages he also tells me he's been wearing his flip-flops a lot at home as well as a new pair of boots that he received as  well. This is as opposed to the postop shoes that we had previously given him. Nonetheless overall I feel like that he has had a little bit more friction to the area which is causing things to look a little bit worse today. 04/24/18 on evaluation today patient's wounds for the most part appear to be doing excellent especially in regard to the bilateral second toes. With that being said he does have issues with the wound still on the first metatarsal region and first toe of the right foot which have not completely closed at this point. Nonetheless I do feel like that he is making progress which is [name], [name] ([id number]) good news. 05/08/18 on evaluation today patient's wounds appear to be doing just a little  bit worse than normal simply due to the fact that he has not had dressing supplies. He apparently owed $35 in order for them to be shipped and unfortunately is not able to pay that currently. We did give him information for the [employer] today which will hopefully help cover his dressing supplies. He was thankful for this he states is gonna contact them this afternoon. Fortunately otherwise or does not hear any signs of infection. 05/22/18 on evaluation today patient appears to be doing somewhat poorly in regard to his toes. Upon further questioning it appears that he actually has been pulling skin off of the toes which "drive him crazy". Subsequently as he does this he calls his new areas to tear open and I think this is one of the reasons why his toes are not healing as appropriately as we would like. He fortunately has no signs of infection at this time. With that being said I'm still concerned about the possibility of a fungal infection even though there doesn't appear to be anything bacterial. Potentially nystatin powder could be of benefit for him as far as trying to help this in that regard. 06/05/18 on evaluation today patient actually appears to be doing very well in  regard to his foot ulcers. Fortunately there's no signs of infection. He's been tolerating the dressing changes without complication. I'm very pleased with how things seem to be progressing. Fortunately there's no evidence of active infection at this point I do believe nystatin powder has been of benefit for him. 06/19/18 on evaluation today patient actually appears to be doing a little worse compared to last evaluation. Tells me that he has not had any supplies for two weeks. With that being said his plantar foot wounds on the right actually appear to be doing very well. It's actually a second toes in the first toes that are the worst. I think these are areas the was able to get to and pick and he tells me in fact that he destine to pick out some of the dry skin because it causes him annoyance. Nonetheless I think this is somewhat detrimental to him I think it's worth keeping part of the wound open in particular. Fortunately there is no sign of infection at this time although I think that is a risk which I discussed specifically that he's not careful you can end up reopening this and causing greater problems for himself. 07/03/18 on evaluation today patient's wound bed actually shows evidence of good granulation at this time and epithelialization as far as the wounds in general are concerned. He has been tolerating the dressing changes including the nystatin without complication. This is good news. With that being said he seems to be still having some trouble with the left second toe distally at this point. 4/9; most of the wounds look quite satisfactory. He requires debridement of the plantar right great toe otherwise everything looks superficial to improved. His left second toe is healed. 08/14/18 on evaluation today patient actually appears to be doing very well in regard to his foot ulcerations. In fact he just has three small areas of the right foot still remaining open the left foot is closed  and dry at this point. Overall I feel like he is doing quite well which is excellent news. Fortunately there's no signs of active infection. 08/28/18 on evaluation today patient's toes of the right foot actually shows signs of being a little bit worse in regard to the overall open areas  at this point. He does tell me that he "washes them well" when he is taking a shower. Again I explained that I want to make sure he's not washing them to aggressively as this could potentially cause issues with overall worsening. Patient History Information obtained from Patient. Social History Current every day smoker, Marital Status - Widowed, Alcohol Use - Daily - quit drinking about a week ago, Drug Use - No History, Caffeine Use - Moderate. Medical History Eyes Denies history of Cataracts, Glaucoma, Optic Neuritis Ear/Nose/Mouth/Throat Denies history of Chronic sinus problems/congestion, Middle ear problems Hematologic/Lymphatic Patient has history of Anemia, Lymphedema Denies history of Hemophilia, Human Immunodeficiency Virus, Sickle Cell Disease Respiratory Denies history of Aspiration, Asthma, Chronic Obstructive Pulmonary Disease (COPD), Pneumothorax, Sleep Apnea, Tuberculosis Cardiovascular Patient has history of Congestive Heart Failure, Hypertension, Peripheral Venous Disease Denies history of Angina, Arrhythmia, Coronary Artery Disease, Deep Vein Thrombosis, Hypotension, Myocardial Infarction, Peripheral Arterial Disease, Phlebitis, Vasculitis Gastrointestinal Denies history of Cirrhosis , Colitis, Crohn s, Hepatitis A, Hepatitis B, Hepatitis C [name], [name] ([id number]) Endocrine Patient has history of Type II Diabetes Genitourinary Denies history of End Stage Renal Disease Immunological Denies history of Lupus Erythematosus, Raynaud s, Scleroderma Integumentary (Skin) Denies history of History of Burn, History of pressure wounds Musculoskeletal Denies history of Gout, Rheumatoid  Arthritis, Osteoarthritis, Osteomyelitis Neurologic Patient has history of Neuropathy Denies history of Dementia, Quadriplegia, Paraplegia, Seizure Disorder Medical And Surgical History Notes 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 Respiratory Denies complaints or symptoms of Chronic or frequent coughs, Shortness of Breath. Cardiovascular Denies complaints or symptoms of Chest pain, LE edema. Neurologic Denies complaints or symptoms of Numbness/parasthesias, Focal/Weakness. Objective Constitutional Well-nourished and well-hydrated in no acute distress. Vitals Time Taken: 8:35 AM, Height: 69 in, Weight: 168 lbs, BMI: 24.8, Temperature: 98.7 F, Pulse: 66 bpm, Respiratory Rate: 18 breaths/min, Blood Pressure: 178/80 mmHg. Respiratory normal breathing without difficulty. clear to auscultation bilaterally. Cardiovascular regular rate and rhythm with normal S1, S2. Psychiatric this patient is able to make decisions and demonstrates good insight into disease process. Alert and Oriented x 3. pleasant and cooperative. General Notes: Patient's wound bed currently do appear to be somewhat macerated and wet I do think is a lot more dreams last time I saw him. I think he needs to be very careful with how aggressively he washes the area and I did discuss this with him yet again today. No fevers, chills, nausea, or vomiting noted at this time. Integumentary (Hair, Skin) Wound #16 status is Open. Original cause of wound was Not Known. The wound is located on the [employer]. The wound measures 0.4cm length x 0.5cm width x 0.2cm depth; 0.157cm^2 area and 0.031cm^3 volume. There is Fat Layer (Subcutaneous Tissue) Exposed exposed. There is no tunneling or undermining noted. There is a medium amount of  serous drainage noted. The wound margin is flat and intact. There is large (67-100%) pink granulation within the wound bed. There is a small (1-33%) amount of necrotic tissue within the wound bed including Adherent Slough. Wound #18 status is Open. Original cause of wound was Gradually Appeared. The wound is located on the Right,Dorsal Toe [name], [name] ([id number]) Great. The wound measures 0.5cm length x 2cm width x 0.1cm depth; 0.785cm^2 area and 0.079cm^3 volume. There is no tunneling or undermining noted. There is a medium amount of serous drainage  noted. The wound margin is flat and intact. There is large (67-100%) pink granulation within the wound bed. There is no necrotic tissue within the wound bed. Wound #2 status is Open. Original cause of wound was Gradually Appeared. The wound is located on the Right Toe Second. The wound measures 0.1cm length x 0.1cm width x 0.1cm depth; 0.008cm^2 area and 0.001cm^3 volume. The wound is limited to skin breakdown. There is no tunneling or undermining noted. There is a small amount of serous drainage noted. The wound margin is indistinct and nonvisible. There is large (67-100%) pink granulation within the wound bed. There is no necrotic tissue within the wound bed. 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 Wound #18 Right,Dorsal Toe Great: Clean wound with  Normal Saline. Wound #2 Right Toe Second: Clean wound with Normal Saline. Anesthetic (add to Medication List): Wound #16 Right,Plantar Toe Great: Topical Lidocaine 4% cream applied to wound bed prior to debridement (In Clinic Only). Wound #18 Right,Dorsal Toe Great: Topical Lidocaine 4% cream applied to wound bed prior to debridement (In Clinic Only). Wound #2 Right Toe Second: Topical Lidocaine 4% cream applied to wound bed prior to debridement (In Clinic Only). Skin Barriers/Peri-Wound Care: Wound #16 Right,Plantar Toe Great: Antifungal powder-Nystatin Wound #18 Right,Dorsal Toe Great: Antifungal powder-Nystatin Wound #2 Right Toe Second: Antifungal powder-Nystatin Primary Wound Dressing: Wound #16 Right,Plantar Toe Great: Silver Alginate - secure with tape Wound #18 Right,Dorsal Toe Great: Silver Alginate - secure with tape Wound #2 Right Toe Second: Silver Alginate - secure with tape Dressing Change Frequency: Wound #16 Right,Plantar Toe Great: Change dressing every other day. Wound #18 Right,Dorsal Toe Great: Change dressing every other day. Mis, Miles E. ([id number]) Wound #2 Right Toe Second: Change dressing every other day. Follow-up Appointments: Wound #16 Right,Plantar Toe Great: Return Appointment in 2 weeks. Wound #18 Right,Dorsal Toe Great: Return Appointment in 2 weeks. Wound #2 Right Toe Second: Return Appointment in 2 weeks. Edema Control: Wound #16 Right,Plantar Toe Great: Patient to wear own compression stockings Wound #18 Right,Dorsal Toe Great: Patient to wear own compression stockings Wound #2 Right Toe Second: Patient to wear own compression stockings Additional Orders / Instructions: Wound #16 Right,Plantar Toe Great: Stop Smoking Increase protein intake. Wound #18 Right,Dorsal Toe Great: Stop Smoking Increase protein intake. Wound #2 Right Toe Second: Stop Smoking Increase protein intake. Medications-please add to medication  list.: Wound #16 Right,Plantar Toe Great: Other: - Nystatin Powder Wound #18 Right,Dorsal Toe Great: Other: - Nystatin Powder Wound #2 Right Toe Second: Other: - Nystatin Powder I'm gonna suggest we continue with the above wound to measures for the next week patients in agreement the plan. We will subsequently see were things that at follow-up. If anything changes worsens meantime he will contact the office and let me know. Please see above for specific wound care orders. We will see patient for re-evaluation in 2 week(s) here in the clinic. If anything worsens or changes patient will contact our office for additional recommendations. Electronic Signature(s) Signed: 08/28/2018 5:22:21 PM By: Worthy Keeler  PA-C Entered By: Worthy Keeler on 08/28/2018 09:55:20 Dippolito, [name] ([id number]) -------------------------------------------------------------------------------- ROS/PFSH Details Patient Name: Leggio, Dickson E. Date of Service: 08/28/2018 8:15 AM Medical Record Number: [id number] Patient Account Number: [id number] Date of Birth/Sex: 1951/03/16 (68 y.o. M) Treating RN: Cornell Barman Primary Care Provider: Lamonte Sakai Other Clinician: Referring Provider: Lamonte Sakai Treating Provider/Extender: Melburn Hake, HOYT Weeks in Treatment: 100 Information Obtained From Patient Constitutional Symptoms (General Health) Complaints and Symptoms: Negative for: Fatigue; Fever; Chills; Marked Weight Change Respiratory Complaints and Symptoms: Negative for: Chronic or frequent coughs; Shortness of Breath Medical History: Negative for: Aspiration; Asthma; Chronic Obstructive Pulmonary Disease (COPD); Pneumothorax; Sleep Apnea; Tuberculosis Cardiovascular Complaints and Symptoms: Negative for: Chest pain; LE edema Medical History: Positive for: Congestive Heart Failure; Hypertension; Peripheral Venous Disease Negative for: Angina; Arrhythmia; Coronary Artery Disease; Deep Vein Thrombosis;  Hypotension; Myocardial Infarction; Peripheral Arterial Disease; Phlebitis; Vasculitis Neurologic Complaints and Symptoms: Negative for: Numbness/parasthesias; Focal/Weakness Medical History: Positive for: Neuropathy Negative for: Dementia; Quadriplegia; Paraplegia; Seizure Disorder Eyes Medical History: Negative for: Cataracts; Glaucoma; Optic Neuritis Ear/Nose/Mouth/Throat Medical History: Negative for: Chronic sinus problems/congestion; Middle ear problems Hematologic/Lymphatic Medical History: Positive for: Anemia; Lymphedema Negative for: Hemophilia; Human Immunodeficiency Virus; Sickle Cell Disease Gastrointestinal Medical History: Negative for: Cirrhosis ; Colitis; Crohnos; Hepatitis A; Hepatitis B; Hepatitis C Endocrine Massie, Jaeger E. 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 Electronic  Signature(s) Signed: 08/28/2018 4:50:49 PM By: Gretta Cool, BSN, RN, CWS, Kim RN, BSN Signed: 08/28/2018 5:22:21 PM By: Worthy Keeler PA-C Entered By: Worthy Keeler on 08/28/2018 09:54:44 Reeder, [name] ([id number]) -------------------------------------------------------------------------------- SuperBill Details Patient Name: Sainz, Fransico E. Date of Service: 08/28/2018 Medical Record Number: [id number] Patient Account Number: [id number] Date of Birth/Sex: Aug 07, 1950 (68 y.o. 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 E11.621 Type 2 diabetes mellitus with foot ulcer I87.323 Chronic venous hypertension (idiopathic) with inflammation of bilateral lower extremity I89.0 Lymphedema, not elsewhere classified F17.218 Nicotine dependence, cigarettes, 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 abuse 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, right ankle and foot Facility Procedures CPT4 Code: [id number] Description: 99213 - WOUND CARE VISIT-LEV 3 EST PT Modifier: Quantity: 1 Physician Procedures CPT4 Code Description: [id number] 92119 - WC PHYS LEVEL 4 - EST PT ICD-10 Diagnosis Description L97.521 Non-pressure chronic ulcer of other part of left foot limited to L97.511 Non-pressure chronic  ulcer of other part of right foot limited t E11.621 Type 2  diabetes mellitus with foot ulcer I87.323 Chronic venous hypertension (idiopathic) with inflammation of bi Modifier: breakdown of s o breakdown of lateral lower e Quantity: 1 kin skin xtremity Electronic Signature(s) Signed: 08/28/2018 5:22:21  PM By: Worthy Keeler PA-C Entered By: Worthy Keeler on 08/28/2018 09:55:57

## 2018-09-11 ENCOUNTER — Ambulatory Visit: Payer: Medicare Other | Admitting: Physician Assistant

## 2018-09-12 ENCOUNTER — Encounter: Payer: Medicare Other | Attending: Physician Assistant | Admitting: Physician Assistant

## 2018-09-12 ENCOUNTER — Ambulatory Visit: Payer: Medicare Other | Admitting: Physician Assistant

## 2018-09-12 ENCOUNTER — Other Ambulatory Visit: Payer: Self-pay

## 2018-09-12 DIAGNOSIS — F1010 Alcohol abuse, uncomplicated: Secondary | ICD-10-CM | POA: Diagnosis not present

## 2018-09-12 DIAGNOSIS — I509 Heart failure, unspecified: Secondary | ICD-10-CM | POA: Diagnosis not present

## 2018-09-12 DIAGNOSIS — E1140 Type 2 diabetes mellitus with diabetic neuropathy, unspecified: Secondary | ICD-10-CM | POA: Insufficient documentation

## 2018-09-12 DIAGNOSIS — F17210 Nicotine dependence, cigarettes, uncomplicated: Secondary | ICD-10-CM | POA: Diagnosis not present

## 2018-09-12 DIAGNOSIS — I890 Lymphedema, not elsewhere classified: Secondary | ICD-10-CM | POA: Diagnosis not present

## 2018-09-12 DIAGNOSIS — I110 Hypertensive heart disease with heart failure: Secondary | ICD-10-CM | POA: Diagnosis not present

## 2018-09-12 DIAGNOSIS — E11621 Type 2 diabetes mellitus with foot ulcer: Secondary | ICD-10-CM | POA: Insufficient documentation

## 2018-09-12 DIAGNOSIS — I87323 Chronic venous hypertension (idiopathic) with inflammation of bilateral lower extremity: Secondary | ICD-10-CM | POA: Diagnosis not present

## 2018-09-12 DIAGNOSIS — L97521 Non-pressure chronic ulcer of other part of left foot limited to breakdown of skin: Secondary | ICD-10-CM | POA: Diagnosis not present

## 2018-09-12 DIAGNOSIS — L97511 Non-pressure chronic ulcer of other part of right foot limited to breakdown of skin: Secondary | ICD-10-CM | POA: Diagnosis not present

## 2018-09-12 NOTE — Progress Notes (Signed)
PRINCESTON, BLIZZARD ([id number]) Visit Report for 09/12/2018 Chief Complaint Document Details Patient Name: [name], [name] KNUTZEN. Date of Service: 09/12/2018 9:30 AM Medical Record Number: [id number] Patient Account Number: [id number] Date of Birth/Sex: October 13, 1950 (68 y.o. M) Treating RN: Montey Hora Primary Care Provider: Lamonte Sakai Other Clinician: Referring Provider: Lamonte Sakai Treating Provider/Extender: Melburn Hake, HOYT Weeks in Treatment: 103 Information Obtained from: Patient Chief Complaint Patient presents for treatment of an open diabetic ulcer to both feet Electronic Signature(s) Signed: 09/12/2018 4:38:37 PM By: Worthy Keeler PA-C Entered By: Worthy Keeler on 09/12/2018 10:01:43 [place], [name] ([id number]) -------------------------------------------------------------------------------- HPI Details Patient Name: [name], [name] Date of Service: 09/12/2018 9:30 AM Medical Record Number: [id number] Patient Account Number: [id number] Date of Birth/Sex: 1950/06/04 (68 y.o. M) Treating RN: Montey Hora Primary Care Provider: Lamonte Sakai Other Clinician: Referring Provider: Lamonte Sakai Treating Provider/Extender: Melburn Hake, HOYT Weeks in Treatment: 77 History of Present Illness HPI Description: 68 year old patient here to see as for bilateral feet ulceration to on his left first and second toe and 2 on his right first and second toe, which she's had for about 4 months. 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 Donella Stade. The patient is also undergoing workup by medical oncology for a intra-abdominal lymphadenopathy. 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 He is also status post appendectomy, inguinal lymph node biopsy, rectal biopsy and  rectal examination under anesthesia. he currently smokes cigarettes about half packet a day. In March of this year he was seen by Dr. Hortencia Pilar, for evaluation of bilateral varicose veins and besides wearing compression stockings he had recommended laser ablation of the right and left great saphenous veins to eleviate the symptoms and complications of severe superficial venous reflux disease. He also recommended lymphedema pumps for better control of his lymphedema. The patient recently has had on 08/23/2016, right greater saphenous vein ablation with the laser energy Earlier lower extremity venous reflux examination done on 05/08/2016 showed no DVT or SVT both lower legs but incompetence of bilateral great saphenous veins was present. A lower arterial study was also done and there was no significant right lower and left lower extremity problems based on a normal toe brachial index bilaterally and the ABI was 1.21 the left and 1.23 on the right. His post ablation venous duplex examination showed successful ablation of the right GS vein with thrombus formation 2 below the right saphenofemoral junction. The deep system was patent without evidence of thrombosis and this was done on 08/30/2016. 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 Addendum: regarding his x-rays done today and x-ray of the left foot -- IMPRESSION: No objective evidence of osteomyelitis. There are soft tissue changes which may reflect cellulitis. X-ray of the right foot -- IMPRESSION:Findings compatible with cellulitis of the toes. No objective evidence of osteomyelitis is observed. 10/01/16 on evaluation today patient's wounds appeared to be doing some better. I did review the x-rays as well which showed no evidence of osteomyelitis although there was evidence on x-ray of cellulitis. He fortunately is not having  any discomfort although he continues to have some swelling. He does not remember being on any antibiotics  recently. 10/15/16 on evaluation today patient's wounds overall appear to be doing better although he does have a new location noted on the left foot. Fortunately he is not having significant pain. It almost has the appearance that something is rubbing on the end of his toes but he wears the open toe shoes and according to what he is telling me never wears anything that would rub on his foot. There is no evidence of infection and specifically no evidence of a fungal infection 10/22/16 On evaluation today patient's wounds appeared to be doing better compared to last week in regard to his bilateral lower extremities. Fortunately I happy with how things are progressing although he still has ulcers I feel like that he is improving and appropriate manner. 11/12/16 on evaluation today patient appears to be doing well in regard to his bilateral feet and the respective wounds. We have been using surrounding her dressings along with an antifungal cream which seems to be doing very well. He has no bilateral dysfunction noticed that the rituals are weight loss at this point. He also has no nausea or vomiting a note purulent discharge. He did see Vein and vascular today and he tells me that they told him he could have surgery for his venous stasis but they did not feel like it was worth it in his words. Fortunately patient's wounds do appear to be getting sneakily better. [name], [name] ([id number]) 11/26/2016 -- he says he is going to have some surgery during this week at [facility] for possibly a colon resection. 12/31/2016 -- the patient has been noncompliant with his smoking and I'm not sure whether he is also started drinking again. He continues to be very nonchalant about his care 01/14/2017 -- the patient's HandP has been reviewed well and I understand he is being compliant with trying to give up  smoking and his local dressing changes. He does not have any surgical options of 4 to him by his vascular surgeons.he was last seen in early August by Dr. Hortencia Pilar who recommended compression stockings,and possibly lymph pumps in 2-3 months after doing a review ultrasound. 01/28/2017 - the patient did not have any fresh complaints but on examination I noted a large lacerated wound on the plantar aspect of his right fourth toe which had a lot of necrotic debris and it probes down to bone. 02/07/2017 -- x-ray of the right foot -- IMPRESSION: Soft tissue swelling about the first through fourth toes consistent with cellulitis. New destructive change in the tuft of the distal phalanx of the great toe is consistent with osteomyelitis. 02/14/2017 -- the patient's MRI is pending this coming Monday and he still continues to smoke. We have again gone over off loading of his wounds in great detail and he says he's been compliant. 02/21/2017 -- MR of the right foot -- IMPRESSION: 1. Soft tissue ulcer at the tip of the first, second and third toe knows. Cortical irregularity and bone marrow edema in the first distal phalanx most concerning for osteomyelitis. Mild marrow edema in the second and third distal phalanx without definite cortical destruction which may reflect early osteomyelitis versus reactive marrow edema. 2. Soft tissue edema surrounding the first phalanx most consistent with cellulitis. the patient was also recently evaluated by his medical oncologist Dr. 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 She is treating him with weekly Procrit. She is also keeping intra-abdominal lymphadenopathy and right lower  lobe lung nodule under observation. 04/04/2017 -- he was seen by Dr. Adrian Prows on 03/25/2017 -- after review he empirically put him on ciprofloxacin and doxycycline as they have good bone penetration and good  bioavailability and it will cover the usual pathogens and diabetic foot osteomyelitis. He will check inflammatory markers and plan a 67-89 week old records. C-reactive protein was 0.3 and the ESR was 72 04/18/17 on evaluation today patient appears to be doing about the same in regard to his lower extremity wounds bilaterally. He has continued to use the antifungal cream which does seem to be beneficial. Nonetheless the ulcers do seem to in some areas be epithelial eyes over and in other areas are still open. He is having no significant discomfort. 04/25/17-he is here in follow-up evaluation for multiple ulcerations to multiple toes bilaterally. He states he did see Dr. Ola Spurr again last week and continues antibiotic therapy. He is voicing no complaints or concerns, will continue with current treatment plan will possibility of adding compression therapy next week after an additional week of treatment/lotions to BLE prescribed by Dr Ola Spurr 05/02/17 he is here in follow up for for multiple ulcers to multiple toes bilaterally. we will stop using antifungal cream and will continue with silvercel and follow up next week 05/09/17-he is here in follow-up for multiple ulcerations to multiple toes bilaterally. There is improvement in appearance. He has not completely stopped using antifungal cream, but admits he has not using it between the toes. He has an appointment with Dr. Ola Spurr on 2/11, continues on doxycycline and Cipro. It has been 5 weeks of antibiotic therapy, we will order plain film xray to evaluate for osteomyelitis next week, prior to follow up with ID. Will continue with silvercel and follow up next week 05/16/17-he is here in follow-up evaluation for multiple ulcerations to multiple toes bilaterally and new wound to the right posterior heel. There is essentially no change in appearance, deteriorating measurements; he has a history of waxing and waning measurements. He admits that he  continues to apply moisturizer/cream/ointment to his toes despite weekly reminders to only apply silvercel to his toes. He states that he thinks the surgical shoe contributed to the superficial ulcer to his posterior heel, he is unable to articulate if this was an area of dry cracked skin as he has a similar area to the left heel. He now is wearing open toed slippers. He has an appointment with Dr. Ola Spurr on 2/11. We have ordered x-rays for her bilateral feet; he was advised to obtain the x-rays today or tomorrow. He will follow-up next week 05/23/17-he is here in follow-up evaluation for multiple ulcerations to multiple toes bilaterally and the right posterior heel. There is improvement in maceration. He has been compliant and not applying any moisturizing agent to his toes. He has been using Lac-Hydrin for his lower extremities with improvement. He did not go to his appointment on Monday with Dr. Ola Spurr secondary to financial concerns. X-rays for her bilateral feet showed: LEFT FOOT with slight erosion of the tuft of the distal phalanges of the left first and second toe suspicious for osteomyelitis, RIGHT FOOT with 1.erosion of the tufts of the distal Farinas, Juvencio E. ([id number]) phalanges of the right first second and possibly third toes consistent with osteomyelitis, 2 no definitive abnormality of the calcaneus is seen on the images obtained, 3. Plantar calcaneal degenerative spur. We briefly discussed hyperbaric adjunctive therapy for treatment of chronic refractory osteomyelitis. I do not find an a1c in  EMR, will contact PCP for record, or order if needed. He has been encouraged to contact Dr Ola Spurr office regarding the follow-up appointment, encouraged him to inquire about payment plan. We will continue with same treatment plan and follow-up next week. He states he is still taking antibiotics and has "a lot" left. He states he has been taking them as directed, 2 pills twice daily.  According to Dr. Blane Ohara office notes he was originally started on 12/17 for 4 weeks and extended on 1/14 for an additional 4 weeks. He should be done with his antibiotic therapy, he was advised to bring his bottles and to his next appointment, we will contact pharmacy. 05/30/17-he is here in follow-up evaluation for multiple ulcerations to multiple toes bilaterally and the right posterior heel. He is accompanied by his brother-in-law. Wounds are stable. He has yet to make up with Dr. Ola Spurr. We contacted his PCP, with no record of recent A1c we will draw an A1c. His brother-in-law states that he was taken off all of his diabetic medication secondary to kidney function. 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 The antibiotics were counted and have approximately 2 weeks left, although they should be complete. The pill organizer reveals missing days. We discussed the need for consistent medications, to have optimal benefit of medication. He has a cousin that lives with him and he will ask her to check his organizer daily. He has been advised to follow up with Dr Ola Spurr, and will go by the office today. He has been advised to quit smoking. 06/06/17-he is here in follow up evaluation. He has had to make an appointment with Dr. Ola Spurr. He did have blood work obtained, a1c 5. He continues to take antibiotic therapy. Significant improvement in bilateral lower extremity edema with compression therapy. Essentially no change in ulcerations to toes. He states he is "going to try something different" and "let me know next week" if it works; he would not provide any additional information and was encouraged to follow our orders. We will follow up next week 06/13/17-he is here in follow-up evaluation. He has an appointment with Dr. Ola Spurr tomorrow morning. He states he purchased an ointment from  [facility] and applied to his toes for 3 days, he does not remember the name of the ointment. There is improvement to his wounds, minimal maceration. He continues to take antibiotic therapy, this should have been completed last month. His brother-in-law who regularly accompanies his appointments was asked to take the bottles to the appointment tomorrow with Dr. Ola Spurr so he is aware. We will continue with 3 layer compression, and order OPEN TOE compression 20-30mmHg; we will apply compression stockings next week. He continues to smoke, smoked "2 cigarettes" last week 06/20/17 on evaluation today patient did receive his compression stockings which he has with him today for both lower extremities. With that being said he tells me at this point in time that he is very happy to have these he really is not a big fan of the compression wraps that we have been utilizing although they have been of great benefit for him. Nonetheless at this point he does want to switch to the compression stockings. In my opinion as long as he is continuing with compression I'm okay with the stockings or the wraps. 06/27/17-he is here in follow-up evaluation for multiple ulcerations to his bilateral toes. There is some improvement in appearance. He is compliant  in wearing his compression stockings with significant improvement in lower extremity edema. He saw Dr Ola Spurr on 3/8, per his notes they would redraw ESR and CRP; plan to continue antibiotic therapy if these remain elevated. I do not see an ESR or CRP level in Epic. The patient continues to take antibiotics. 07/11/17-He is here in follow-up evaluation for multiple ulcerations to multiple toes bilaterally. He presents with complete epithelialization to the right third toe; there has been no deterioration. He continues on antibiotic therapy. He will follow-up next week 07/18/17-He is here in follow-up evaluation for multiple ulcerations to multiple toes bilaterally. He  continues to make improvement. He continues on antibiotic therapy. He states he has been using something additional to our orders, he does not elaborate but states he will bring it in next week. 07/25/17-He is here in follow up evaluation for multiple ulcerations to bilateral toes. He is stable. He has completed antibiotic therapy. He admits to "filing" his toes after showers each evening, this is what he was referencing last week; he does not filing for the wounds. We will switch to [facility] and monitor for any improvement, he will follow-up next week 08/01/17-He is here in follow-up evaluation. He admits to "picking" at his toes after cleansing yesterday, leading to new areas of tissue loss on the bilateral second toe. There is improvement noted to the bilateral great toe. We will dress toes today and hope that they maintain until Monday where he will come in for a nurse visit. He has been advised, multiple times with expressed verbalization, to change the dressings to silvercel if the dressings get wet prior to [facility] appointment. 08/08/17-He is here in follow-up evaluation for bilateral first and second toe ulcerations. There is significant improvement to all ulcerations since last visit. We will switch to [name] to all wounds and he will continue with nurse visits on a Monday/Thursday schedule and follow-up with me in 2 weeks. He continues to smoke, 1-3 cigarettes per day, and has been encouraged to not smoke until his next follow-up in 2 weeks. 08/15/17 on evaluation today patient's ulcers on his toes actually appear to be doing fairly well the right to ulcers may be a little bit more moist compared to the left I'm not really sure exactly why as the openings appear to be very small and I'm not seeing any evidence of anything significant as far as you lightest or otherwise. Nonetheless this may just be a small setback he's [name], [name] ([id number]) been doing very well with the  [facility] Dressing 08/22/17-He is here in follow-up evaluation for ulcerations to his bilateral first and second toes, there is small/scant amount of drainage noted on today's dressing. He continues with Hydrofera Blue. He continues to smoke, 1 cigarette a day. Voices no complaint or concerns, compliant and compression therapy today. He'll follow-up next week 08/29/17-He is here in follow-up evaluation for ulcerations to his bilateral first and second toes, with a new wound to the left third toe. He states he cut himself while cutting the toenail. He presents today with more maceration and increased measurements; waxing and waning measurements and moisture has been an ongoing issue. He continues to smoke a proximally 1 cigarette per day, but states he has not smoked since Sunday. We will initiate medihoney daily and evaluate next week. He presents today without compression stockings 09/05/17-He is here in follow-up evaluation for ulcerations to bilateral first and second and left third toe. He did not pick up the  medihoney and therefore has not been changing his dressing. He admits to inconsistent where of compression stockings, admits to pain to his bilateral lower extremities with unilateral edema (right greater than left). The ulcerations to multiple toes are more macerated and larger in size than last week. He is wearing compression therapy today. He admits to an overall feeling of weakness and fatigue and has been encouraged to contact his PCP for evaluation; he has a known history of anemia and intraabdominal lymphadenopathy. We will return to silvercel and he will follow up next week 09/19/17-He is here in follow-up evaluation for ulcerations to bilateral first and second toe.he was unable to make last week's appointment. The culture that was obtained on 5/30 grew clindamycin sensitive MRSA; he was initiated on 6/4 but did not start it until 6/7. There is significant improvement in all  wounds, healing to left second and third toe. He also admits to having no alcohol or smoking for the last 6 days. We will continue with same treatment plan I will extend the clindamycin for an additional 10 days and he will follow-up next week. 09/26/17- He is here in follow evaluation for multiple ulcerations to multiple toes bilaterally. He continues to be non-compliant with dressing, compression therapy. He states he is taking the antibiotics as prescribed, although his recall is inconsistent. There is noted deterioration in all ulcers. We will continue same treatment plan, antibiotic therapy and he will follow up next week. 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 This information is obtained from the medical record, as he cannot articulate specifics to his diagnosis or treatment plan. 10/17/17-He is here in follow-up evaluation for multiple ulcerations to multiple toes bilaterally. There is chronic waxing and waning improvement/deterioration. On today's exam they appear improved, dry without maceration. He continues to apply a variety of topical agents, stating that he has switched between the blue product and silver product. In my opinion, this will be a chronic waxing and waning giving his intermittent compliance. He will follow-up in 2 weeks. As it relates to the biopsy taken from his buttock, he has not followed up with that and has not started radiation therapy. 10/31/17-He is here for evaluation for multiple ulcerations to multiple toes bilaterally. Continues with waxing and waning improvement versus deterioration. They are stable on today's exam without significant maceration. 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 We will continue with every 2 week follow-up appointments 11/14/17- He  is seen in follow-up evaluation for multiple ulcerations to multiple bilateral toes. He is scheduled for excision of basal cell carcinoma to his right buttock on Monday 8/12. He continues to have chronic waxing and waning of his ulcers; today is a stable day with minimal drainage and left ear is better than right toes. I will recount to infectious disease regarding chronic suppressive therapy. He admits to being more compliant with compression stockings, although he is not wearing any today. He has been encouraged to continue with dressing changes as ordered and compression therapy daily. He will be seen in 2 weeks 11/28/17-He is seen in follow up evaluation for multiple ulcerations to multiple toes bilaterally. He had an overnight admission at [facility] (8/11-8/12) for symptomatic anemia, received two units prbc. His surgical date has been moved to 9/94 excision of basal cell carcinoma to  the right buttock. He has been noncompliant in wearing compression stockings, applying topical treatment as ordered; he has been applying a "cream", Hydrofera Blue, silvercel. He is currently on no antibiotic therapy; we will send for re-referral to ID for suppressive therapy given his multiple co-morbidities, poor surgical candidate, unwilling to have toe amputation and general non-adherence. 12/19/17 on evaluation today patient actually appears to be doing very well in regard to his toes at least compared to last time I saw him. He again has been coming to our office for quite a bit of time. 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 He states is hurting a little bit but doing well in general. I'm see were not taking care of this area dermatology is. He does have his appointment with infectious disease upcoming which they re-consult for suppressive therapy that's next Monday. 01/02/18 on evaluation  today patient actually appears to be doing rather well in regard to his toe ulcers although he tells me he has been put in an ointment on this which I am concerned may be causing some additional moisture buildup which we really do not want. He supposed be using silver cell which is mainly thing I want him to be utilizing. Fortunately there does not appear to be any evidence of worsening infection which is good news. He has had surgery on his gluteal region where he had an excision of a cancerous area fortunately that seems to be doing well although he is having some discomfort. 02/06/18 on evaluation today patient actually appears to be doing well at all locations except for his second toe use bilaterally Sinor, Dunbar E. ([id number]) where the distal tip is still wrong open. He did state that he can't stand for dry skin to buildup on his toes subsequently as a question and further it sounds as if he actually pills this all which is likely keeping the toe is wrong and open. Also think that's what happened in the bottom of his foot although that appears to have healed at this point. There does not appear to be any evidence of infection. 02/20/18 on evaluation today patient appears to be doing better in regard to his foot ulcers. Everything has shown signs of improvement which is excellent news. Overall I'm very pleased with how things appear currently. The patient states he's not picking her point at any skin and that he wears shoes at all times when he is ambulating at home. Both are things that I've advocated and recommended for him in the past. 03/13/18 on evaluation today patient actually appears to be doing very well in regard to the bilateral toes in the overall appearance of the wound bed locations. Fortunately there does not appear to be any signs of infection at this time. He states that he is very pleased with how things seem to be progressing to be honest as am I. 03/27/1899 evaluation today  patient appears to be doing well in regard to his toe ulcers. He just has a few areas remaining and these appear to be showing signs of improvement as well which is great news. Overall I have been impressed with the progress of the past several weeks. 04/10/18 on evaluation today patient appears to be doing a little bit worse in regard to the right plantar foot at the regional the first metatarsal as well as the left second toe. He tells me that  he ran out of bandages he also tells me he's been wearing his flip-flops a lot at home as well as a new pair of boots that he received as well. This is as opposed to the postop shoes that we had previously given him. Nonetheless overall I feel like that he has had a little bit more friction to the area which is causing things to look a little bit worse today. 04/24/18 on evaluation today patient's wounds for the most part appear to be doing excellent especially in regard to the bilateral second toes. With that being said he does have issues with the wound still on the first metatarsal region and first toe of the right foot which have not completely closed at this point. Nonetheless I do feel like that he is making progress which is good news. 05/08/18 on evaluation today patient's wounds appear to be doing just a little bit worse than normal simply due to the fact that he has not had dressing supplies. He apparently owed $35 in order for them to be shipped and unfortunately is not able to pay that currently. We did give him information for the [employer] today which will hopefully help cover his dressing supplies. He was thankful for this he states is gonna contact them this afternoon. Fortunately otherwise or does not hear any signs of infection. 05/22/18 on evaluation today patient appears to be doing somewhat poorly in regard to his toes. Upon further questioning it appears that he actually has been pulling skin off of the toes which "drive  him crazy". Subsequently as he does this he calls his new areas to tear open and I think this is one of the reasons why his toes are not healing as appropriately as we would like. He fortunately has no signs of infection at this time. With that being said I'm still concerned about the possibility of a fungal infection even though there doesn't appear to be anything bacterial. Potentially nystatin powder could be of benefit for him as far as trying to help this in that regard. 06/05/18 on evaluation today patient actually appears to be doing very well in regard to his foot ulcers. Fortunately there's no signs of infection. He's been tolerating the dressing changes without complication. I'm very pleased with how things seem to be progressing. Fortunately there's no evidence of active infection at this point I do believe nystatin powder has been of benefit for him. 06/19/18 on evaluation today patient actually appears to be doing a little worse compared to last evaluation. Tells me that he has not had any supplies for two weeks. With that being said his plantar foot wounds on the right actually appear to be doing very well. It's actually a second toes in the first toes that are the worst. I think these are areas the was able to get to and pick and he tells me in fact that he destine to pick out some of the dry skin because it causes him annoyance. Nonetheless I think this is somewhat detrimental to him I think it's worth keeping part of the wound open in particular. Fortunately there is no sign of infection at this time although I think that is a risk which I discussed specifically that he's not careful you can end up reopening this and causing greater problems for himself. 07/03/18 on evaluation today patient's wound bed actually shows evidence of good granulation at this time and epithelialization as far as the wounds in general are concerned. He  has been tolerating the dressing changes including the  nystatin without complication. This is good news. With that being said he seems to be still having some trouble with the left second toe distally at this point. 4/9; most of the wounds look quite satisfactory. He requires debridement of the plantar right great toe otherwise everything Chirico, Azavier E. ([id number]) looks superficial to improved. His left second toe is healed. 08/14/18 on evaluation today patient actually appears to be doing very well in regard to his foot ulcerations. In fact he just has three small areas of the right foot still remaining open the left foot is closed and dry at this point. Overall I feel like he is doing quite well which is excellent news. Fortunately there's no signs of active infection. 08/28/18 on evaluation today patient's toes of the right foot actually shows signs of being a little bit worse in regard to the overall open areas at this point. He does tell me that he "washes them well" when he is taking a shower. Again I explained that I want to make sure he's not washing them to aggressively as this could potentially cause issues with overall worsening. 09/12/18 on evaluation today patient actually appears to be doing well in regard to his right foot ulcers. He's been tolerating the dressing changes without complication. Fortunately there's no signs of active infection at this time. With that being said he does have a new one on the dorsal surface of his foot just proximal to his toes he does not know really where this came from. Electronic Signature(s) Signed: 09/12/2018 4:38:37 PM By: Worthy Keeler PA-C Entered By: Worthy Keeler on 09/12/2018 11:54:11 Crotty, [name] ([id number]) -------------------------------------------------------------------------------- Physical Exam Details Patient Name: Montenegro, Safwan E. Date of Service: 09/12/2018 9:30 AM Medical Record Number: [id number] Patient Account Number: [id number] Date of Birth/Sex: 1950-06-05 (68 y.o.  M) Treating RN: Montey Hora Primary Care Provider: Lamonte Sakai Other Clinician: Referring Provider: Lamonte Sakai Treating Provider/Extender: Melburn Hake, HOYT Weeks in Treatment: 44 Constitutional Well-nourished and well-hydrated in no acute distress. Respiratory normal breathing without difficulty. clear to auscultation bilaterally. Cardiovascular regular rate and rhythm with normal S1, S2. Psychiatric this patient is able to make decisions and demonstrates good insight into disease process. Alert and Oriented x 3. pleasant and cooperative. Notes Patient's wound bed currently showed signs of good granulation at all sites in fact his toes appear to be doing well the plantar aspect of his foot on the right also doing fairly well. With that being said no sharp agreement was required at any site I was able to mechanically debride away the saline and gauze without complication. Electronic Signature(s) Signed: 09/12/2018 4:38:37 PM By: Worthy Keeler PA-C Entered By: Worthy Keeler on 09/12/2018 11:54:47 [name], [name] ([id number]) -------------------------------------------------------------------------------- Physician Orders Details Patient Name: [name], [name] Date of Service: 09/12/2018 9:30 AM Medical Record Number: [id number] Patient Account Number: [id number] Date of Birth/Sex: 11-Feb-1951 (68 y.o. M) Treating RN: Montey Hora Primary Care Provider: Lamonte Sakai Other Clinician: Referring Provider: Lamonte Sakai Treating Provider/Extender: Melburn Hake, HOYT Weeks in Treatment: 50 Verbal / Phone Orders: No Diagnosis Coding ICD-10 Coding Code Description L97.521 Non-pressure chronic ulcer of other part of left foot limited to breakdown of skin L97.511 Non-pressure chronic ulcer of other part of right foot limited to breakdown of skin E11.621 Type 2 diabetes mellitus with foot ulcer I87.323 Chronic venous hypertension (idiopathic) with inflammation of bilateral lower  extremity I89.0 Lymphedema, not elsewhere classified F17.218 Nicotine  dependence, cigarettes, 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 abuse 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, right ankle and foot Wound Cleansing Wound #16 Right,Plantar Toe Great o Clean wound with Normal Saline. Wound #19 Right,Dorsal Foot o Clean wound with Normal Saline. Wound #18 Right,Dorsal Toe Great o Clean wound with Normal Saline. Wound #2 Right Toe Second o Clean wound with Normal Saline. Anesthetic (add to Medication List) Wound #16 Right,Plantar Toe Great o Topical Lidocaine 4% cream applied to wound bed prior to debridement (In Clinic Only). Wound #19 Right,Dorsal Foot o Topical Lidocaine 4% cream applied to wound bed prior to debridement (In Clinic Only). Wound #18 Right,Dorsal Toe Great o Topical Lidocaine 4% cream applied to wound bed prior to debridement (In Clinic Only). Wound #2 Right Toe Second o Topical Lidocaine 4% cream applied to wound bed prior to debridement (In Clinic Only). Skin Barriers/Peri-Wound Care Wound #16 Right,Plantar Toe Great o Antifungal powder-Nystatin Wound #19 Right,Dorsal Foot [name], [name] ([id number]) o Antifungal powder-Nystatin Wound #18 Right,Dorsal Toe Great o Antifungal powder-Nystatin Wound #2 Right Toe Second o Antifungal powder-Nystatin Primary Wound Dressing Wound #16 Right,Plantar Toe Great o Silver Alginate - secure with tape Wound #19 Right,Dorsal Foot o Silver Alginate - secure with tape Wound #18 Right,Dorsal Toe Great o Silver Alginate - secure with tape Wound #2 Right Toe Second o Silver Alginate - secure with tape Dressing Change Frequency Wound #16 Right,Plantar Toe Great o Change dressing every other day. Wound #19 Right,Dorsal Foot o Change dressing every other day. Wound #18 Right,Dorsal Toe Great o Change dressing every other day. Wound  #2 Right Toe Second o Change dressing every other day. Follow-up Appointments Wound #16 Right,Plantar Toe Great o Return Appointment in 2 weeks. Wound #19 Right,Dorsal Foot o Return Appointment in 2 weeks. Wound #18 Right,Dorsal Toe Great o Return Appointment in 2 weeks. Wound #2 Right Toe Second o Return Appointment in 2 weeks. Edema Control Wound #16 Right,Plantar Toe Great o Patient to wear own compression stockings Wound #19 Right,Dorsal Foot o Patient to wear own compression stockings Wound #18 Right,Dorsal Toe Great o Patient to wear own compression stockings Pridmore, Zander E. ([id number]) Wound #2 Right Toe Second o Patient to wear own compression stockings Additional Orders / Instructions o Stop Smoking o Increase protein intake. Medications-please add to medication list. Wound #16 Right,Plantar Toe Great o Other: - Nystatin Powder Wound #19 Right,Dorsal Foot o Other: - Nystatin Powder Wound #18 Right,Dorsal Toe Great o Other: - Nystatin Powder Wound #2 Right Toe Second o Other: - Nystatin Powder Electronic Signature(s) Signed: 09/12/2018 4:32:34 PM By: Montey Hora Signed: 09/12/2018 4:38:37 PM By: Worthy Keeler PA-C Entered By: Montey Hora on 09/12/2018 10:05:36 [name], [name] ([id number]) -------------------------------------------------------------------------------- Problem List Details Patient Name: Takayama, Obdulio E. Date of Service: 09/12/2018 9:30 AM Medical Record Number: [id number] Patient Account Number: [id number] Date of Birth/Sex: [birth year] (68 y.o. M) Treating RN: Montey Hora Primary Care Provider: Lamonte Sakai Other Clinician: Referring Provider: Lamonte Sakai Treating Provider/Extender: Melburn Hake, HOYT Weeks in Treatment: 66 Active Problems ICD-10 Evaluated Encounter Code Description Active Date Today Diagnosis L97.521 Non-pressure chronic ulcer of other part of left foot limited to 09/21/2016 No Yes breakdown  of skin L97.511 Non-pressure chronic ulcer of other part of right foot limited to 09/21/2016 No Yes breakdown of skin E11.621 Type 2 diabetes mellitus with foot ulcer 09/21/2016 No Yes I87.323 Chronic venous hypertension (idiopathic) with inflammation of 09/21/2016 No Yes bilateral lower extremity I89.0 Lymphedema, not elsewhere classified 09/21/2016  No Yes F17.218 Nicotine dependence, cigarettes, with other nicotine-induced 09/21/2016 No Yes disorders F10.19 Alcohol abuse with unspecified alcohol-induced disorder 09/21/2016 No Yes M86.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, right ankle and foot 02/21/2017 No Yes Inactive Problems Resolved Problems Corl, [name] ([id number]) Electronic Signature(s) Signed: 09/12/2018 4:38:37 PM By: Worthy Keeler PA-C Entered By: Worthy Keeler on 09/12/2018 10:01:37 [name], [name] ([id number]) -------------------------------------------------------------------------------- Progress Note Details Patient Name: Heino, Antolin E. Date of Service: 09/12/2018 9:30 AM Medical Record Number: [id number] Patient Account Number: [id number] Date of Birth/Sex: Jan 28, 1951 (68 y.o. M) Treating RN: Montey Hora Primary Care Provider: Lamonte Sakai Other Clinician: Referring Provider: Lamonte Sakai Treating Provider/Extender: Melburn Hake, HOYT Weeks in Treatment: 103 Subjective Chief Complaint Information obtained from Patient Patient presents for treatment of an open diabetic ulcer to both feet History of Present Illness (HPI) 68 year old patient here to see as for bilateral feet ulceration to on his left first and second toe and 2 on his right first and second toe, which she's had for about 4 months. 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 Donella Stade. The patient is also undergoing workup by medical oncology for a intra-abdominal  lymphadenopathy. 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 He is also status post appendectomy, inguinal lymph node biopsy, rectal biopsy and rectal examination under anesthesia. he currently smokes cigarettes about half packet a day. In March of this year he was seen by Dr. Hortencia Pilar, for evaluation of bilateral varicose veins and besides wearing compression stockings he had recommended laser ablation of the right and left great saphenous veins to eleviate the symptoms and complications of severe superficial venous reflux disease. He also recommended lymphedema pumps for better control of his lymphedema. The patient recently has had on 08/23/2016, right greater saphenous vein ablation with the laser energy Earlier lower extremity venous reflux examination done on 05/08/2016 showed no DVT or SVT both lower legs but incompetence of bilateral great saphenous veins was present. A lower arterial study was also done and there was no significant right lower and left lower extremity problems based on a normal toe brachial index bilaterally and the ABI was 1.21 the left and 1.23 on the right. His post ablation venous duplex examination showed successful ablation of the right GS vein with thrombus formation 2 below the right saphenofemoral junction. The deep system was patent without evidence of thrombosis and this was done on 08/30/2016. 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 Addendum: regarding his x-rays done today and x-ray of the left foot -- IMPRESSION: No objective evidence of osteomyelitis. There are soft tissue changes which may reflect cellulitis. X-ray of the right foot -- IMPRESSION:Findings compatible with cellulitis of the toes. No objective evidence of  osteomyelitis is observed. 10/01/16 on evaluation today patient's wounds appeared to be doing some better. I did review the x-rays as well which showed no evidence of osteomyelitis although there was evidence on x-ray of cellulitis. He fortunately is not having any discomfort although he continues to have some swelling. He does not remember being on any antibiotics recently. 10/15/16 on evaluation today patient's wounds overall appear to be doing better although he does have a new  location noted on the left foot. Fortunately he is not having significant pain. It almost has the appearance that something is rubbing on the end of his toes but he wears the open toe shoes and according to what he is telling me never wears anything that would rub on his foot. There is no evidence of infection and specifically no evidence of a fungal infection 10/22/16 On evaluation today patient's wounds appeared to be doing better compared to last week in regard to his bilateral lower extremities. Fortunately I happy with how things are progressing although he still has ulcers I feel like that he is improving and appropriate manner. [name], [name] ([id number]) 11/12/16 on evaluation today patient appears to be doing well in regard to his bilateral feet and the respective wounds. We have been using surrounding her dressings along with an antifungal cream which seems to be doing very well. He has no bilateral dysfunction noticed that the rituals are weight loss at this point. He also has no nausea or vomiting a note purulent discharge. He did see Vein and vascular today and he tells me that they told him he could have surgery for his venous stasis but they did not feel like it was worth it in his words. Fortunately patient's wounds do appear to be getting sneakily better. 11/26/2016 -- he says he is going to have some surgery during this week at [facility] for possibly a colon resection. 12/31/2016 -- the patient has been  noncompliant with his smoking and I'm not sure whether he is also started drinking again. He continues to be very nonchalant about his care 01/14/2017 -- the patient's HandP has been reviewed well and I understand he is being compliant with trying to give up smoking and his local dressing changes. He does not have any surgical options of 4 to him by his vascular surgeons.he was last seen in early August by Dr. Hortencia Pilar who recommended compression stockings,and possibly lymph pumps in 2-3 months after doing a review ultrasound. 01/28/2017 - the patient did not have any fresh complaints but on examination I noted a large lacerated wound on the plantar aspect of his right fourth toe which had a lot of necrotic debris and it probes down to bone. 02/07/2017 -- x-ray of the right foot -- IMPRESSION: Soft tissue swelling about the first through fourth toes consistent with cellulitis. New destructive change in the tuft of the distal phalanx of the great toe is consistent with osteomyelitis. 02/14/2017 -- the patient's MRI is pending this coming Monday and he still continues to smoke. We have again gone over off loading of his wounds in great detail and he says he's been compliant. 02/21/2017 -- MR of the right foot -- IMPRESSION: 1. Soft tissue ulcer at the tip of the first, second and third toe knows. Cortical irregularity and bone marrow edema in the first distal phalanx most concerning for osteomyelitis. Mild marrow edema in the second and third distal phalanx without definite cortical destruction which may reflect early osteomyelitis versus reactive marrow edema. 2. Soft tissue edema surrounding the first phalanx most consistent with cellulitis. the patient was also recently evaluated by his medical oncologist Dr. 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 She is treating him with weekly Procrit. She is also  keeping intra-abdominal lymphadenopathy and right lower lobe lung nodule under observation. 04/04/2017 -- he was seen by Dr. Adrian Prows on 03/25/2017 -- after  review he empirically put him on ciprofloxacin and doxycycline as they have good bone penetration and good bioavailability and it will cover the usual pathogens and diabetic foot osteomyelitis. He will check inflammatory markers and plan a 85-2 week old records. C-reactive protein was 0.3 and the ESR was 72 04/18/17 on evaluation today patient appears to be doing about the same in regard to his lower extremity wounds bilaterally. He has continued to use the antifungal cream which does seem to be beneficial. Nonetheless the ulcers do seem to in some areas be epithelial eyes over and in other areas are still open. He is having no significant discomfort. 04/25/17-he is here in follow-up evaluation for multiple ulcerations to multiple toes bilaterally. He states he did see Dr. Ola Spurr again last week and continues antibiotic therapy. He is voicing no complaints or concerns, will continue with current treatment plan will possibility of adding compression therapy next week after an additional week of treatment/lotions to BLE prescribed by Dr Ola Spurr 05/02/17 he is here in follow up for for multiple ulcers to multiple toes bilaterally. we will stop using antifungal cream and will continue with silvercel and follow up next week 05/09/17-he is here in follow-up for multiple ulcerations to multiple toes bilaterally. There is improvement in appearance. He has not completely stopped using antifungal cream, but admits he has not using it between the toes. He has an appointment with Dr. Ola Spurr on 2/11, continues on doxycycline and Cipro. It has been 5 weeks of antibiotic therapy, we will order plain film xray to evaluate for osteomyelitis next week, prior to follow up with ID. Will continue with silvercel and follow up next week 05/16/17-he is  here in follow-up evaluation for multiple ulcerations to multiple toes bilaterally and new wound to the right posterior heel. There is essentially no change in appearance, deteriorating measurements; he has a history of waxing and waning measurements. He admits that he continues to apply moisturizer/cream/ointment to his toes despite weekly reminders to only apply silvercel to his toes. He states that he thinks the surgical shoe contributed to the superficial ulcer to his posterior heel, he is unable to articulate if this was an area of dry cracked skin as he has a similar area to the left heel. He now is wearing open toed slippers. He has an appointment with Dr. Ola Spurr on 2/11. We have ordered x-rays for her bilateral feet; [name], [name] ([id number]) he was advised to obtain the x-rays today or tomorrow. He will follow-up next week 05/23/17-he is here in follow-up evaluation for multiple ulcerations to multiple toes bilaterally and the right posterior heel. There is improvement in maceration. He has been compliant and not applying any moisturizing agent to his toes. He has been using Lac-Hydrin for his lower extremities with improvement. He did not go to his appointment on Monday with Dr. Ola Spurr secondary to financial concerns. X-rays for her bilateral feet showed: LEFT FOOT with slight erosion of the tuft of the distal phalanges of the left first and second toe suspicious for osteomyelitis, RIGHT FOOT with 1.erosion of the tufts of the distal phalanges of the right first second and possibly third toes consistent with osteomyelitis, 2 no definitive abnormality of the calcaneus is seen on the images obtained, 3. Plantar calcaneal degenerative spur. We briefly discussed hyperbaric adjunctive therapy for treatment of chronic refractory osteomyelitis. I do not find an a1c in EMR, will contact PCP for record, or order if needed. He has been encouraged to contact Dr Ola Spurr office  regarding the  follow-up appointment, encouraged him to inquire about payment plan. We will continue with same treatment plan and follow-up next week. He states he is still taking antibiotics and has "a lot" left. He states he has been taking them as directed, 2 pills twice daily. According to Dr. Blane Ohara office notes he was originally started on 12/17 for 4 weeks and extended on 1/14 for an additional 4 weeks. He should be done with his antibiotic therapy, he was advised to bring his bottles and to his next appointment, we will contact pharmacy. 05/30/17-he is here in follow-up evaluation for multiple ulcerations to multiple toes bilaterally and the right posterior heel. He is accompanied by his brother-in-law. Wounds are stable. He has yet to make up with Dr. Ola Spurr. We contacted his PCP, with no record of recent A1c we will draw an A1c. His brother-in-law states that he was taken off all of his diabetic medication secondary to kidney function. 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 The antibiotics were counted and have approximately 2 weeks left, although they should be complete. The pill organizer reveals missing days. We discussed the need for consistent medications, to have optimal benefit of medication. He has a cousin that lives with him and he will ask her to check his organizer daily. He has been advised to follow up with Dr Ola Spurr, and will go by the office today. He has been advised to quit smoking. 06/06/17-he is here in follow up evaluation. He has had to make an appointment with Dr. Ola Spurr. He did have blood work obtained, a1c 5. He continues to take antibiotic therapy. Significant improvement in bilateral lower extremity edema with compression therapy. Essentially no change in ulcerations to toes. He states he is "going to try something different" and "let me know next week" if it  works; he would not provide any additional information and was encouraged to follow our orders. We will follow up next week 06/13/17-he is here in follow-up evaluation. He has an appointment with Dr. Ola Spurr tomorrow morning. He states he purchased an ointment from [facility] and applied to his toes for 3 days, he does not remember the name of the ointment. There is improvement to his wounds, minimal maceration. He continues to take antibiotic therapy, this should have been completed last month. His brother-in-law who regularly accompanies his appointments was asked to take the bottles to the appointment tomorrow with Dr. Ola Spurr so he is aware. We will continue with 3 layer compression, and order OPEN TOE compression 20-30mmHg; we will apply compression stockings next week. He continues to smoke, smoked "2 cigarettes" last week 06/20/17 on evaluation today patient did receive his compression stockings which he has with him today for both lower extremities. With that being said he tells me at this point in time that he is very happy to have these he really is not a big fan of the compression wraps that we have been utilizing although they have been of great benefit for him. Nonetheless at this point he does want to switch to the compression stockings. In my opinion as long as he is continuing with compression I'm okay with the stockings or the wraps. 06/27/17-he is here in follow-up evaluation for multiple ulcerations to his bilateral toes. There is some improvement in appearance. He is compliant in wearing his compression stockings with significant improvement in lower extremity edema. He saw Dr Ola Spurr on 3/8, per  his notes they would redraw ESR and CRP; plan to continue antibiotic therapy if these remain elevated. I do not see an ESR or CRP level in Epic. The patient continues to take antibiotics. 07/11/17-He is here in follow-up evaluation for multiple ulcerations to multiple toes bilaterally.  He presents with complete epithelialization to the right third toe; there has been no deterioration. He continues on antibiotic therapy. He will follow-up next week 07/18/17-He is here in follow-up evaluation for multiple ulcerations to multiple toes bilaterally. He continues to make improvement. He continues on antibiotic therapy. He states he has been using something additional to our orders, he does not elaborate but states he will bring it in next week. 07/25/17-He is here in follow up evaluation for multiple ulcerations to bilateral toes. He is stable. He has completed antibiotic therapy. He admits to "filing" his toes after showers each evening, this is what he was referencing last week; he does not filing for the wounds. We will switch to [facility] and monitor for any improvement, he will follow-up next week 08/01/17-He is here in follow-up evaluation. He admits to "picking" at his toes after cleansing yesterday, leading to new areas of tissue loss on the bilateral second toe. There is improvement noted to the bilateral great toe. We will dress toes today and hope that they maintain until Monday where he will come in for a nurse visit. He has been advised, multiple times with expressed verbalization, to change the dressings to silvercel if the dressings get wet prior to [facility] appointment. 08/08/17-He is here in follow-up evaluation for bilateral first and second toe ulcerations. There is significant improvement to all ulcerations since last visit. We will switch to [facility] to all wounds and he will continue with nurse visits on a Montminy, Lenoard E. ([id number]) Monday/Thursday schedule and follow-up with me in 2 weeks. He continues to smoke, 1-3 cigarettes per day, and has been encouraged to not smoke until his next follow-up in 2 weeks. 08/15/17 on evaluation today patient's ulcers on his toes actually appear to be doing fairly well the right to ulcers may be a little bit more moist  compared to the left I'm not really sure exactly why as the openings appear to be very small and I'm not seeing any evidence of anything significant as far as you lightest or otherwise. Nonetheless this may just be a small setback he's been doing very well with the [facility] Dressing 08/22/17-He is here in follow-up evaluation for ulcerations to his bilateral first and second toes, there is small/scant amount of drainage noted on today's dressing. He continues with Hydrofera Blue. He continues to smoke, 1 cigarette a day. Voices no complaint or concerns, compliant and compression therapy today. He'll follow-up next week 08/29/17-He is here in follow-up evaluation for ulcerations to his bilateral first and second toes, with a new wound to the left third toe. He states he cut himself while cutting the toenail. He presents today with more maceration and increased measurements; waxing and waning measurements and moisture has been an ongoing issue. He continues to smoke a proximally 1 cigarette per day, but states he has not smoked since Sunday. We will initiate medihoney daily and evaluate next week. He presents today without compression stockings 09/05/17-He is here in follow-up evaluation for ulcerations to bilateral first and second and left third toe. He did not pick up the medihoney and therefore has not been changing his dressing. He admits to inconsistent where of compression stockings, admits to  pain to his bilateral lower extremities with unilateral edema (right greater than left). The ulcerations to multiple toes are more macerated and larger in size than last week. He is wearing compression therapy today. He admits to an overall feeling of weakness and fatigue and has been encouraged to contact his PCP for evaluation; he has a known history of anemia and intraabdominal lymphadenopathy. We will return to silvercel and he will follow up next week 09/19/17-He is here in follow-up evaluation for  ulcerations to bilateral first and second toe.he was unable to make last week's appointment. The culture that was obtained on 5/30 grew clindamycin sensitive MRSA; he was initiated on 6/4 but did not start it until 6/7. There is significant improvement in all wounds, healing to left second and third toe. He also admits to having no alcohol or smoking for the last 6 days. We will continue with same treatment plan I will extend the clindamycin for an additional 10 days and he will follow-up next week. 09/26/17- He is here in follow evaluation for multiple ulcerations to multiple toes bilaterally. He continues to be non-compliant with dressing, compression therapy. He states he is taking the antibiotics as prescribed, although his recall is inconsistent. There is noted deterioration in all ulcers. We will continue same treatment plan, antibiotic therapy and he will follow up next week. 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 This information is obtained from the medical record, as he cannot articulate specifics to his diagnosis or treatment plan. 10/17/17-He is here in follow-up evaluation for multiple ulcerations to multiple toes bilaterally. There is chronic waxing and waning improvement/deterioration. On today's exam they appear improved, dry without maceration. He continues to apply a variety of topical agents, stating that he has switched between the blue product and silver product. In my opinion, this will be a chronic waxing and waning giving his intermittent compliance. He will follow-up in 2 weeks. As it relates to the biopsy taken from his buttock, he has not followed up with that and has not started radiation therapy. 10/31/17-He is here for evaluation for multiple ulcerations to multiple toes bilaterally. Continues  with waxing and waning improvement versus deterioration. They are stable on today's exam without significant maceration. 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 We will continue with every 2 week follow-up appointments 11/14/17- He is seen in follow-up evaluation for multiple ulcerations to multiple bilateral toes. He is scheduled for excision of basal cell carcinoma to his right buttock on Monday 8/12. He continues to have chronic waxing and waning of his ulcers; today is a stable day with minimal drainage and left ear is better than right toes. I will recount to infectious disease regarding chronic suppressive therapy. He admits to being more compliant with compression stockings, although he is not wearing any today. He has been encouraged to continue with dressing changes as ordered and compression therapy daily. He will be seen in 2 weeks 11/28/17-He is seen in follow up evaluation for multiple ulcerations to multiple toes bilaterally. He had an overnight admission at [facility] (8/11-8/12) for symptomatic anemia, received two units prbc. His surgical date has been moved to 9/94 excision of basal cell carcinoma to the right buttock. He has been noncompliant in wearing compression stockings, applying topical treatment as ordered; he has been  applying a "cream", Hydrofera Blue, silvercel. He is currently on no antibiotic therapy; we will send for re-referral to ID for suppressive therapy given his multiple co-morbidities, poor surgical candidate, unwilling to have toe amputation and general non-adherence. 12/19/17 on evaluation today patient actually appears to be doing very well in regard to his toes at least compared to last time I saw him. He again has been coming to our office for quite a bit of time. 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 He  states is hurting a little bit but doing well in general. I'm see were not taking care of this area dermatology is. He does have his appointment with infectious disease upcoming which they re-consult for suppressive therapy that's next Monday. 01/02/18 on evaluation today patient actually appears to be doing rather well in regard to his toe ulcers although he tells me he Netzer, Boris E. ([id number]) has been put in an ointment on this which I am concerned may be causing some additional moisture buildup which we really do not want. He supposed be using silver cell which is mainly thing I want him to be utilizing. Fortunately there does not appear to be any evidence of worsening infection which is good news. He has had surgery on his gluteal region where he had an excision of a cancerous area fortunately that seems to be doing well although he is having some discomfort. 02/06/18 on evaluation today patient actually appears to be doing well at all locations except for his second toe use bilaterally where the distal tip is still wrong open. He did state that he can't stand for dry skin to buildup on his toes subsequently as a question and further it sounds as if he actually pills this all which is likely keeping the toe is wrong and open. Also think that's what happened in the bottom of his foot although that appears to have healed at this point. There does not appear to be any evidence of infection. 02/20/18 on evaluation today patient appears to be doing better in regard to his foot ulcers. Everything has shown signs of improvement which is excellent news. Overall I'm very pleased with how things appear currently. The patient states he's not picking her point at any skin and that he wears shoes at all times when he is ambulating at home. Both are things that I've advocated and recommended for him in the past. 03/13/18 on evaluation today patient actually appears to be doing very well in regard to the  bilateral toes in the overall appearance of the wound bed locations. Fortunately there does not appear to be any signs of infection at this time. He states that he is very pleased with how things seem to be progressing to be honest as am I. 03/27/1899 evaluation today patient appears to be doing well in regard to his toe ulcers. He just has a few areas remaining and these appear to be showing signs of improvement as well which is great news. Overall I have been impressed with the progress of the past several weeks. 04/10/18 on evaluation today patient appears to be doing a little bit worse in regard to the right plantar foot at the regional the first metatarsal as well as the left second toe. He tells me that he ran out of bandages he also tells me he's been wearing his flip-flops a lot at home as  well as a new pair of boots that he received as well. This is as opposed to the postop shoes that we had previously given him. Nonetheless overall I feel like that he has had a little bit more friction to the area which is causing things to look a little bit worse today. 04/24/18 on evaluation today patient's wounds for the most part appear to be doing excellent especially in regard to the bilateral second toes. With that being said he does have issues with the wound still on the first metatarsal region and first toe of the right foot which have not completely closed at this point. Nonetheless I do feel like that he is making progress which is good news. 05/08/18 on evaluation today patient's wounds appear to be doing just a little bit worse than normal simply due to the fact that he has not had dressing supplies. He apparently owed $35 in order for them to be shipped and unfortunately is not able to pay that currently. We did give him information for the [facility] today which will hopefully help cover his dressing supplies. He was thankful for this he states is gonna contact them this  afternoon. Fortunately otherwise or does not hear any signs of infection. 05/22/18 on evaluation today patient appears to be doing somewhat poorly in regard to his toes. Upon further questioning it appears that he actually has been pulling skin off of the toes which "drive him crazy". Subsequently as he does this he calls his new areas to tear open and I think this is one of the reasons why his toes are not healing as appropriately as we would like. He fortunately has no signs of infection at this time. With that being said I'm still concerned about the possibility of a fungal infection even though there doesn't appear to be anything bacterial. Potentially nystatin powder could be of benefit for him as far as trying to help this in that regard. 06/05/18 on evaluation today patient actually appears to be doing very well in regard to his foot ulcers. Fortunately there's no signs of infection. He's been tolerating the dressing changes without complication. I'm very pleased with how things seem to be progressing. Fortunately there's no evidence of active infection at this point I do believe nystatin powder has been of benefit for him. 06/19/18 on evaluation today patient actually appears to be doing a little worse compared to last evaluation. Tells me that he has not had any supplies for two weeks. With that being said his plantar foot wounds on the right actually appear to be doing very well. It's actually a second toes in the first toes that are the worst. I think these are areas the was able to get to and pick and he tells me in fact that he destine to pick out some of the dry skin because it causes him annoyance. Nonetheless I think this is somewhat detrimental to him I think it's worth keeping part of the wound open in particular. Fortunately there is no sign of infection at this time although I think that is a risk which I discussed specifically that he's not careful you can end up reopening this  and causing greater problems for himself. TERIK, HAUGHEY ([id number]) 07/03/18 on evaluation today patient's wound bed actually shows evidence of good granulation at this time and epithelialization as far as the wounds in general are concerned. He has been tolerating the dressing changes including the nystatin without complication. This is good  news. With that being said he seems to be still having some trouble with the left second toe distally at this point. 4/9; most of the wounds look quite satisfactory. He requires debridement of the plantar right great toe otherwise everything looks superficial to improved. His left second toe is healed. 08/14/18 on evaluation today patient actually appears to be doing very well in regard to his foot ulcerations. In fact he just has three small areas of the right foot still remaining open the left foot is closed and dry at this point. Overall I feel like he is doing quite well which is excellent news. Fortunately there's no signs of active infection. 08/28/18 on evaluation today patient's toes of the right foot actually shows signs of being a little bit worse in regard to the overall open areas at this point. He does tell me that he "washes them well" when he is taking a shower. Again I explained that I want to make sure he's not washing them to aggressively as this could potentially cause issues with overall worsening. 09/12/18 on evaluation today patient actually appears to be doing well in regard to his right foot ulcers. He's been tolerating the dressing changes without complication. Fortunately there's no signs of active infection at this time. With that being said he does have a new one on the dorsal surface of his foot just proximal to his toes he does not know really where this came from. Patient History Information obtained from Patient. Social History Current every day smoker, Marital Status - Widowed, Alcohol Use - Daily - quit drinking about a week  ago, Drug Use - No History, Caffeine Use - Moderate. Medical History Eyes Denies history of Cataracts, Glaucoma, Optic Neuritis Ear/Nose/Mouth/Throat Denies history of Chronic sinus problems/congestion, Middle ear problems Hematologic/Lymphatic Patient has history of Anemia, Lymphedema Denies history of Hemophilia, Human Immunodeficiency Virus, Sickle Cell Disease Respiratory Denies history of Aspiration, Asthma, Chronic Obstructive Pulmonary Disease (COPD), Pneumothorax, Sleep Apnea, Tuberculosis Cardiovascular Patient has history of Congestive Heart Failure, Hypertension, Peripheral Venous Disease Denies history of Angina, Arrhythmia, Coronary Artery Disease, Deep Vein Thrombosis, Hypotension, Myocardial Infarction, Peripheral Arterial Disease, Phlebitis, Vasculitis Gastrointestinal Denies history of Cirrhosis , Colitis, Crohn s, Hepatitis A, Hepatitis B, Hepatitis C Endocrine Patient has history of Type II Diabetes Genitourinary Denies history of End Stage Renal Disease Immunological Denies history of Lupus Erythematosus, Raynaud s, Scleroderma Integumentary (Skin) Denies history of History of Burn, History of pressure wounds Musculoskeletal Denies history of Gout, Rheumatoid Arthritis, Osteoarthritis, Osteomyelitis Neurologic Patient has history of Neuropathy Denies history of Dementia, Quadriplegia, Paraplegia, Seizure Disorder Medical And Surgical History Notes [name], Winford E. ([id number]) 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 Respiratory Denies complaints or symptoms of Chronic or frequent coughs, Shortness of Breath. Cardiovascular Denies complaints or symptoms of Chest pain, LE edema. Psychiatric Denies complaints or symptoms of Anxiety,  Claustrophobia. Objective Constitutional Well-nourished and well-hydrated in no acute distress. Vitals Time Taken: 9:45 AM, Height: 69 in, Weight: 168 lbs, BMI: 24.8, Temperature: 98.2 F, Pulse: 62 bpm, Respiratory Rate: 18 breaths/min, Blood Pressure: 187/82 mmHg. Respiratory normal breathing without difficulty. clear to auscultation bilaterally. Cardiovascular regular rate and rhythm with normal S1, S2. Psychiatric this patient is able to make decisions and demonstrates good insight into disease process. Alert and Oriented x 3. pleasant and cooperative. General Notes: Patient's wound bed currently showed signs  of good granulation at all sites in fact his toes appear to be doing well the plantar aspect of his foot on the right also doing fairly well. With that being said no sharp agreement was required at any site I was able to mechanically debride away the saline and gauze without complication. Integumentary (Hair, Skin) Wound #16 status is Open. Original cause of wound was Not Known. The wound is located on the [employer]. The wound measures 0.9cm length x 1.3cm width x 0.1cm depth; 0.919cm^2 area and 0.092cm^3 volume. There is Fat Layer (Subcutaneous Tissue) Exposed exposed. There is no tunneling or undermining noted. There is a medium amount of serous drainage noted. The wound margin is flat and intact. There is large (67-100%) pink granulation within the wound bed. There is a small (1-33%) amount of necrotic tissue within the wound bed including Adherent Slough. Wound #18 status is Open. Original cause of wound was Gradually Appeared. The wound is located on the Right,[employer] The wound measures 0.7cm length x 0.6cm width x 0.1cm depth; 0.33cm^2 area and 0.033cm^3 volume. There is no tunneling or undermining noted. There is a medium amount of serous drainage noted. The wound margin is flat and intact. There is large (67-100%) pink granulation within the wound  bed. There is no necrotic tissue within the wound bed. Wound #19 status is Open. Original cause of wound was Gradually Appeared. The wound is located on the Right,Dorsal Foot. The wound measures 1cm length x 1.5cm width x 0.1cm depth; 1.178cm^2 area and 0.118cm^3 volume. There is Fat Bittman, Tadeo E. ([id number]) Layer (Subcutaneous Tissue) Exposed exposed. There is no tunneling or undermining noted. The wound margin is flat and intact. There is small (1-33%) pale, hyper - granulation within the wound bed. There is a large (67-100%) amount of necrotic tissue within the wound bed including Adherent Slough. Wound #2 status is Open. Original cause of wound was Gradually Appeared. The wound is located on the Right Toe Second. The wound measures 0.1cm length x 0.1cm width x 0.1cm depth; 0.008cm^2 area and 0.001cm^3 volume. The wound is limited to skin breakdown. There is no tunneling or undermining noted. There is a small amount of serous drainage noted. The wound margin is indistinct and nonvisible. There is large (67-100%) pink granulation within the wound bed. There is no necrotic tissue within the wound bed. 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 Wound #19 Right,Dorsal Foot: Clean wound with Normal Saline. Wound #18 Right,Dorsal Toe Great: Clean wound with Normal Saline. Wound #2 Right Toe  Second: Clean wound with Normal Saline. Anesthetic (add to Medication List): Wound #16 Right,Plantar Toe Great: Topical Lidocaine 4% cream applied to wound bed prior to debridement (In Clinic Only). Wound #19 Right,Dorsal Foot: Topical Lidocaine 4% cream applied to wound bed prior to debridement (In Clinic Only). Wound #18 Right,Dorsal Toe Great: Topical Lidocaine 4% cream applied to wound bed prior to debridement (In Clinic Only). Wound #2 Right Toe Second: Topical Lidocaine 4% cream applied to wound bed prior to debridement (In  Clinic Only). Skin Barriers/Peri-Wound Care: Wound #16 Right,Plantar Toe Great: Antifungal powder-Nystatin Wound #19 Right,Dorsal Foot: Antifungal powder-Nystatin Wound #18 Right,Dorsal Toe GreatMADDUX, VANSCYOC ([id number]) Antifungal powder-Nystatin Wound #2 Right Toe Second: Antifungal powder-Nystatin Primary Wound Dressing: Wound #16 Right,Plantar Toe Great: Silver Alginate - secure with tape Wound #19 Right,Dorsal Foot: Silver Alginate - secure with tape Wound #18 Right,Dorsal Toe Great: Silver Alginate - secure with tape Wound #2 Right Toe Second: Silver Alginate - secure with tape Dressing Change Frequency: Wound #16 Right,Plantar Toe Great: Change dressing every other day. Wound #19 Right,Dorsal Foot: Change dressing every other day. Wound #18 Right,Dorsal Toe Great: Change dressing every other day. Wound #2 Right Toe Second: Change dressing every other day. Follow-up Appointments: Wound #16 Right,Plantar Toe Great: Return Appointment in 2 weeks. Wound #19 Right,Dorsal Foot: Return Appointment in 2 weeks. Wound #18 Right,Dorsal Toe Great: Return Appointment in 2 weeks. Wound #2 Right Toe Second: Return Appointment in 2 weeks. Edema Control: Wound #16 Right,Plantar Toe Great: Patient to wear own compression stockings Wound #19 Right,Dorsal Foot: Patient to wear own compression stockings Wound #18 Right,Dorsal Toe Great: Patient to  wear own compression stockings Wound #2 Right Toe Second: Patient to wear own compression stockings Additional Orders / Instructions: Stop Smoking Increase protein intake. Medications-please add to medication list.: Wound #16 Right,Plantar Toe Great: Other: - Nystatin Powder Wound #19 Right,Dorsal Foot: Other: - Nystatin Powder Wound #18 Right,Dorsal Toe Great: Other: - Nystatin Powder Wound #2 Right Toe Second: Other: - Nystatin Powder My suggestion at this point is gonna be that we continue with the above wound care measures for the next week and the patient is in agreement with that plan. We will subsequently see were things stand at follow-up. If anything changes or worsens meantime he will contact the office and let me know. [place] ([id number]) Please see above for specific wound care orders. We will see patient for re-evaluation in two week(s) here in the clinic. If anything worsens or changes patient will contact our office for additional recommendations. Electronic Signature(s) Signed: 09/12/2018 4:38:37 PM By: Worthy Keeler PA-C Entered By: Worthy Keeler on 09/12/2018 11:55:05 Joslin, [name] ([id number]) -------------------------------------------------------------------------------- ROS/PFSH Details Patient Name: Annunziato, Efe E. Date of Service: 09/12/2018 9:30 AM Medical Record Number: [id number] Patient Account Number: [id number] Date of Birth/Sex: [birth year] (68 y.o. M) Treating RN: Montey Hora Primary Care Provider: Lamonte Sakai Other Clinician: Referring Provider: Lamonte Sakai Treating Provider/Extender: Melburn Hake, HOYT Weeks in Treatment: 103 Information Obtained From Patient Constitutional Symptoms (General Health) Complaints and Symptoms: Negative for: Fatigue; Fever; Chills; Marked Weight Change Respiratory Complaints and Symptoms: Negative for: Chronic or frequent coughs; Shortness of Breath Medical History: Negative for: Aspiration; Asthma;  Chronic Obstructive Pulmonary Disease (COPD); Pneumothorax; Sleep Apnea; Tuberculosis Cardiovascular Complaints and Symptoms: Negative for: Chest pain; LE edema Medical History: Positive for: Congestive Heart Failure; Hypertension; Peripheral Venous Disease Negative for: Angina; Arrhythmia; Coronary Artery Disease; Deep Vein Thrombosis; Hypotension; Myocardial Infarction; Peripheral Arterial Disease; Phlebitis; Vasculitis Psychiatric Complaints and Symptoms: Negative for: Anxiety; Claustrophobia Eyes Medical History: Negative for: Cataracts; Glaucoma; Optic Neuritis Ear/Nose/Mouth/Throat Medical History: Negative for: Chronic sinus problems/congestion; Middle ear problems Hematologic/Lymphatic Medical History: Positive for: Anemia; Lymphedema Negative for: Hemophilia; Human Immunodeficiency Virus; Sickle Cell Disease Gastrointestinal Giuffre, Edge E. ([id number]) Medical History: Negative for: Cirrhosis ; Colitis; Crohnos; Hepatitis A; Hepatitis B; Hepatitis C Endocrine Medical History: Positive for: Type II Diabetes Treated with: Oral agents Blood sugar tested every day: Yes Tested : QD Genitourinary  Medical History: Negative for: End Stage Renal Disease Immunological Medical History: Negative for: Lupus Erythematosus; Raynaudos; Scleroderma Integumentary (Skin) Medical History: Negative for: History of Burn; History of pressure wounds Musculoskeletal Medical History: Negative for: Gout; Rheumatoid Arthritis; Osteoarthritis; Osteomyelitis Neurologic Medical History: Positive for: Neuropathy Negative for: Dementia; Quadriplegia; Paraplegia; Seizure Disorder Oncologic Medical History: Past Medical History Notes: 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 SINA, LUCCHESI ([id number]) Electronic Signature(s) Signed: 09/12/2018 4:32:34 PM By: Montey Hora Signed: 09/12/2018 4:38:37 PM By: Worthy Keeler PA-C Entered By: Worthy Keeler on 09/12/2018 11:54:34 Rampey, [name] ([id number]) -------------------------------------------------------------------------------- SuperBill Details Patient Name: Mccreery, Jasn E. Date of Service: 09/12/2018 Medical Record Number: [id number] Patient Account Number: [id number] Date of Birth/Sex: [birth year] (68 y.o. 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 I89.0 Lymphedema, not elsewhere classified F17.218 Nicotine dependence, cigarettes, 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 abuse 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, right ankle and foot Physician Procedures CPT4 Code Description: [id number] [id number] - WC PHYS LEVEL 4 - EST PT ICD-10 Diagnosis Description L97.521 Non-pressure chronic ulcer of other part of left foot limited to L97.511 Non-pressure chronic ulcer of other part of right foot limited t E11.621 Type 2  diabetes  mellitus with foot ulcer I87.323 Chronic venous hypertension (idiopathic) with inflammation of bi Modifier: breakdown of s o breakdown of lateral lower e Quantity: 1 kin skin xtremity Electronic Signature(s) Signed: 09/12/2018 4:38:37 PM By: Worthy Keeler PA-C Entered By: Worthy Keeler on 09/12/2018 11:55:18

## 2018-09-12 NOTE — Progress Notes (Signed)
[name], [name] ([id number]) Visit Report for 09/12/2018 Arrival Information Details Patient Name: [name] Date of Service: 09/12/2018 9:30 AM Medical Record Number: [id number] Patient Account Number: [id number] Date of Birth/Sex: 1950-04-19 (68 y.o. M) Treating RN: Harold Barban Primary Care Harden Bramer: Lamonte Sakai Other Clinician: Referring Jozeph Persing: Lamonte Sakai Treating Nnamdi Dacus/Extender: Melburn Hake, HOYT Weeks in Treatment: 22 Visit Information History Since Last Visit Added or deleted any medications: No Patient Arrived: Ambulatory Any new allergies or adverse reactions: No Arrival Time: 09:44 Had a fall or experienced change in No Accompanied By: self activities of daily living that may affect Transfer Assistance: None risk of falls: Patient Identification Verified: Yes Signs or symptoms of abuse/neglect since last visito No Secondary Verification Process Completed: Yes Hospitalized since last visit: No Patient Requires Transmission-Based No Has Dressing in Place as Prescribed: Yes Precautions: Pain Present Now: No Patient Has Alerts: Yes Patient Alerts: DMII Electronic Signature(s) Signed: 09/12/2018 2:03:23 PM By: Harold Barban Entered By: Harold Barban on 09/12/2018 09:45:11 Renne, [name] ([id number]) -------------------------------------------------------------------------------- Clinic Level of Care Assessment Details Patient Name: [name], [name] Date of Service: 09/12/2018 9:30 AM Medical Record Number: [id number] Patient Account Number: [id number] Date of Birth/Sex: 26-Jul-1950 (68 y.o. M) Treating RN: Montey Hora Primary Care Mirage Pfefferkorn: Lamonte Sakai Other Clinician: Referring Tracee Mccreery: Lamonte Sakai Treating Derell Bruun/Extender: Melburn Hake, HOYT Weeks in Treatment: 103 Clinic Level of Care Assessment Items TOOL 4 Quantity Score []  - Use when only an EandM is performed on FOLLOW-UP visit 0 ASSESSMENTS - Nursing Assessment / Reassessment X - Reassessment  of Co-morbidities (includes updates in patient status) 1 10 X- 1 5 Reassessment of Adherence to Treatment Plan ASSESSMENTS - Wound and Skin Assessment / Reassessment []  - Simple Wound Assessment / Reassessment - one wound 0 X- 4 5 Complex Wound Assessment / Reassessment - multiple wounds []  - 0 Dermatologic / Skin Assessment (not related to wound area) ASSESSMENTS - Focused Assessment []  - Circumferential Edema Measurements - multi extremities 0 []  - 0 Nutritional Assessment / Counseling / Intervention X- 1 5 Lower Extremity Assessment (monofilament, tuning fork, pulses) []  - 0 Peripheral Arterial Disease Assessment (using hand held doppler) ASSESSMENTS - Ostomy and/or Continence Assessment and Care []  - Incontinence Assessment and Management 0 []  - 0 Ostomy Care Assessment and Management (repouching, etc.) PROCESS - Coordination of Care X - Simple Patient / Family Education for ongoing care 1 15 []  - 0 Complex (extensive) Patient / Family Education for ongoing care X- 1 10 Staff obtains Programmer, systems, Records, Test Results / Process Orders []  - 0 Staff telephones HHA, Nursing Homes / Clarify orders / etc []  - 0 Routine Transfer to another Facility (non-emergent condition) []  - 0 Routine Hospital Admission (non-emergent condition) []  - 0 New Admissions / Biomedical engineer / Ordering NPWT, Apligraf, etc. []  - 0 Emergency Hospital Admission (emergent condition) X- 1 10 Simple Discharge Coordination Medinger, Sukhdeep E. ([id number]) []  - 0 Complex (extensive) Discharge Coordination PROCESS - Special Needs []  - Pediatric / Minor Patient Management 0 []  - 0 Isolation Patient Management []  - 0 Hearing / Language / Visual special needs []  - 0 Assessment of Community assistance (transportation, D/C planning, etc.) []  - 0 Additional assistance / Altered mentation []  - 0 Support Surface(s) Assessment (bed, cushion, seat, etc.) INTERVENTIONS - Wound Cleansing / Measurement []   - Simple Wound Cleansing - one wound 0 X- 4 5 Complex Wound Cleansing - multiple wounds X- 1 5 Wound Imaging (photographs - any number of wounds) []  - 0  Wound Tracing (instead of photographs) []  - 0 Simple Wound Measurement - one wound X- 4 5 Complex Wound Measurement - multiple wounds INTERVENTIONS - Wound Dressings X - Small Wound Dressing one or multiple wounds 4 10 []  - 0 Medium Wound Dressing one or multiple wounds []  - 0 Large Wound Dressing one or multiple wounds X- 1 5 Application of Medications - topical []  - 0 Application of Medications - injection INTERVENTIONS - Miscellaneous []  - External ear exam 0 []  - 0 Specimen Collection (cultures, biopsies, blood, body fluids, etc.) []  - 0 Specimen(s) / Culture(s) sent or taken to Lab for analysis []  - 0 Patient Transfer (multiple staff / Civil Service fast streamer / Similar devices) []  - 0 Simple Staple / Suture removal (25 or less) []  - 0 Complex Staple / Suture removal (26 or more) []  - 0 Hypo / Hyperglycemic Management (close monitor of Blood Glucose) []  - 0 Ankle / Brachial Index (ABI) - do not check if billed separately X- 1 5 Vital Signs Kugler, Artem E. ([id number]) Has the patient been seen at the hospital within the last three years: Yes Total Score: 170 Level Of Care: New/Established - Level 5 Electronic Signature(s) Signed: 09/12/2018 4:32:34 PM By: Montey Hora Entered By: Montey Hora on 09/12/2018 10:06:15 Alejandro, [name] ([id number]) -------------------------------------------------------------------------------- Encounter Discharge Information Details Patient Name: [name], [name] Date of Service: 09/12/2018 9:30 AM Medical Record Number: [id number] Patient Account Number: [id number] Date of Birth/Sex: 1950/04/21 (68 y.o. M) Treating RN: Cornell Barman Primary Care Luise Yamamoto: Lamonte Sakai Other Clinician: Referring Hadiyah Maricle: Lamonte Sakai Treating Lokelani Lutes/Extender: Melburn Hake, HOYT Weeks in Treatment: 66 Encounter  Discharge Information Items Discharge Condition: Stable Ambulatory Status: Ambulatory Discharge Destination: Home Transportation: Private Auto Accompanied By: self Schedule Follow-up Appointment: Yes Clinical Summary of Care: Electronic Signature(s) Signed: 09/12/2018 10:39:57 AM By: Gretta Cool, BSN, RN, CWS, Kim RN, BSN Entered By: Gretta Cool, BSN, RN, CWS, Kim on 09/12/2018 10:39:57 Pangelinan, [name] ([id number]) -------------------------------------------------------------------------------- Lower Extremity Assessment Details Patient Name: Morrell, Jakobe E. Date of Service: 09/12/2018 9:30 AM Medical Record Number: [id number] Patient Account Number: [id number] Date of Birth/Sex: March 13, 1951 (68 y.o. M) Treating RN: Harold Barban Primary Care Bonnee Zertuche: Lamonte Sakai Other Clinician: Referring Jasan Doughtie: Lamonte Sakai Treating Bach Rocchi/Extender: Melburn Hake, HOYT Weeks in Treatment: 103 Vascular Assessment Pulses: Dorsalis Pedis Palpable: [Right:Yes] Posterior Tibial Palpable: [Right:Yes] Electronic Signature(s) Signed: 09/12/2018 2:03:23 PM By: Harold Barban Entered By: Harold Barban on 09/12/2018 09:57:47 Lunde, Derin E. ([id number]) -------------------------------------------------------------------------------- Multi Wound Chart Details Patient Name: Yoakum, Ruston E. Date of Service: 09/12/2018 9:30 AM Medical Record Number: [id number] Patient Account Number: [id number] Date of Birth/Sex: 06-Jan-1951 (68 y.o. M) Treating RN: Montey Hora Primary Care Rylen Swindler: Lamonte Sakai Other Clinician: Referring Mccartney Brucks: Lamonte Sakai Treating Deveron Shamoon/Extender: Melburn Hake, HOYT Weeks in Treatment: 103 Vital Signs Height(in): 59 Pulse(bpm): 46 Weight(lbs): 168 Blood Pressure(mmHg): 187/82 Body Mass Index(BMI): 25 Temperature(F): 98.2 Respiratory Rate 18 (breaths/min): Photos: Wound Location: Right Toe Great - Plantar Right Toe Great - Dorsal Right Foot - Dorsal Wounding Event: Not Known  Gradually Appeared Gradually Appeared Primary Etiology: Diabetic Wound/Ulcer of 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 Lower Extremity Lower Extremity Comorbid History: Anemia, Lymphedema, Anemia, Lymphedema, Anemia, Lymphedema, Congestive Heart Failure, Congestive Heart Failure, Congestive Heart Failure, Hypertension, Peripheral Hypertension, Peripheral Hypertension, Peripheral Venous Disease, Type II Venous Disease, Type II Venous Disease, Type II Diabetes, Neuropathy Diabetes, Neuropathy Diabetes, Neuropathy Date Acquired: 10/29/2017 05/22/2018 09/05/2018 Weeks of Treatment: 45 16 0 Wound Status: Open Open Open Pending Amputation  on No No No Presentation: Measurements L x W x D 0.9x1.3x0.1 0.7x0.6x0.1 1x1.5x0.1 (cm) Area (cm) : 0.919 0.33 1.178 Volume (cm) : 0.092 0.033 0.118 % Reduction in Area: 42.60% 58.80% 0.00% % Reduction in Volume: 42.50% 58.80% 0.00% Classification: Grade 1 Grade 1 Grade 2 Exudate Amount: Medium Medium N/A Exudate Type: Serous Serous N/A Exudate Color: amber amber N/A Wound Margin: Flat and Intact Flat and Intact Flat and Intact Granulation Amount: Large (67-100%) Large (67-100%) Small (1-33%) Granulation Quality: Pink Pink Pale, Hyper-granulation Necrotic Amount: Small (1-33%) None Present (0%) Large (67-100%) Exposed Structures: Eubanks, Nethaniel E. ([id number]) Fat Layer (Subcutaneous Fascia: No Fat Layer (Subcutaneous Tissue) Exposed: Yes Fat Layer (Subcutaneous Tissue) Exposed: Yes Fascia: No Tissue) Exposed: No Fascia: No Tendon: No Tendon: No Tendon: No Muscle: No Muscle: No Muscle: No Joint: No Joint: No Joint: No Bone: No Bone: No Bone: No Epithelialization: Small (1-33%) Medium (34-66%) None Wound Number: 2 N/A N/A Photos: N/A N/A Wound Location: Right Toe Second N/A N/A Wounding Event: Gradually Appeared N/A N/A Primary Etiology: Diabetic Wound/Ulcer of the N/A N/A Lower Extremity Comorbid  History: Anemia, Lymphedema, N/A N/A Congestive Heart Failure, Hypertension, Peripheral Venous Disease, Type II Diabetes, Neuropathy Date Acquired: 06/11/2016 N/A N/A Weeks of Treatment: 103 N/A N/A Wound Status: Open N/A N/A Pending Amputation on Yes N/A N/A Presentation: Measurements L x W x D 0.1x0.1x0.1 N/A N/A (cm) Area (cm) : 0.008 N/A N/A Volume (cm) : 0.001 N/A N/A % Reduction in Area: 99.70% N/A N/A % Reduction in Volume: 99.70% N/A N/A Classification: Grade 2 N/A N/A Exudate Amount: Small N/A N/A Exudate Type: Serous N/A N/A Exudate Color: amber N/A N/A Wound Margin: Indistinct, nonvisible N/A N/A Granulation Amount: Large (67-100%) N/A N/A Granulation Quality: Pink N/A N/A Necrotic Amount: None Present (0%) N/A N/A Exposed Structures: Fascia: No N/A N/A Fat Layer (Subcutaneous Tissue) Exposed: No Tendon: No Muscle: No Joint: No Bone: No Limited to Skin Breakdown Epithelialization: Large (67-100%) N/A N/A Treatment Notes Haymond, HERMEN MARIO ([id number]) Electronic Signature(s) Signed: 09/12/2018 4:32:34 PM By: Montey Hora Entered By: Montey Hora on 09/12/2018 10:03:26 Fromer, [name] ([id number]) -------------------------------------------------------------------------------- Multi-Disciplinary Care Plan Details Patient Name: Maiers, Aaditya E. Date of Service: 09/12/2018 9:30 AM Medical Record Number: [id number] Patient Account Number: [id number] Date of Birth/Sex: 02-11-1951 (68 y.o. M) Treating RN: Montey Hora Primary Care Kyrin Gratz: Lamonte Sakai Other Clinician: Referring Kariana Wiles: Lamonte Sakai Treating Jamarkis Branam/Extender: Melburn Hake, HOYT Weeks in Treatment: 66 Active Inactive Abuse / Safety / Falls / Self Care Management Nursing Diagnoses: Potential for falls Goals: Patient will remain injury free related to falls Date Initiated: 09/21/2016 Target Resolution Date: 09/14/2017 Goal Status: Active Interventions: Assess fall risk on admission and as  needed Notes: Nutrition Nursing Diagnoses: Potential for alteratiion in Nutrition/Potential for imbalanced nutrition Goals: Patient/caregiver agrees to and verbalizes understanding of need to use nutritional supplements and/or vitamins as prescribed Date Initiated: 09/21/2016 Target Resolution Date: 09/14/2017 Goal Status: Active Interventions: Assess patient nutrition upon admission and as needed per policy Notes: Orientation to the Wound Care Program Nursing Diagnoses: Knowledge deficit related to the wound healing center program Goals: Patient/caregiver will verbalize understanding of the [facility] Date Initiated: 09/21/2016 Target Resolution Date: 06/15/2017 Goal Status: Active Interventions: Provide education on orientation to the wound center Lingafelter, Montrey E. ([id number]) Notes: Wound/Skin Impairment Nursing Diagnoses: Knowledge deficit related to smoking impact on wound healing Goals: Ulcer/skin breakdown will have a volume reduction of 30% by week 4 Date Initiated: 09/21/2016 Target Resolution Date: 08/17/2017  Goal Status: Active Ulcer/skin breakdown will have a volume reduction of 50% by week 8 Date Initiated: 09/21/2016 Target Resolution Date: 08/17/2017 Goal Status: Active Ulcer/skin breakdown will have a volume reduction of 80% by week 12 Date Initiated: 09/21/2016 Target Resolution Date: 09/14/2017 Goal Status: Active Ulcer/skin breakdown will heal within 14 weeks Date Initiated: 09/21/2016 Target Resolution Date: 08/17/2017 Goal Status: Active Interventions: Assess patient/caregiver ability to obtain necessary supplies Assess patient/caregiver ability to perform ulcer/skin care regimen upon admission and as needed Assess ulceration(s) every visit Notes: Electronic Signature(s) Signed: 09/12/2018 4:32:34 PM By: Montey Hora Entered By: Montey Hora on 09/12/2018 10:03:17 Mentel, Jeison E.  ([id number]) -------------------------------------------------------------------------------- Pain Assessment Details Patient Name: Murthy, Dominyck E. Date of Service: 09/12/2018 9:30 AM Medical Record Number: [id number] Patient Account Number: [id number] Date of Birth/Sex: 06-07-1950 (68 y.o. M) Treating RN: Harold Barban Primary Care Vincient Vanaman: Lamonte Sakai Other Clinician: Referring Rei Medlen: Lamonte Sakai Treating Sakari Alkhatib/Extender: Melburn Hake, HOYT Weeks in Treatment: 103 Active Problems Location of Pain Severity and Description of Pain Patient Has Paino No Site Locations Pain Management and Medication Current Pain Management: Electronic Signature(s) Signed: 09/12/2018 2:03:23 PM By: Harold Barban Entered By: Harold Barban on 09/12/2018 09:45:29 Florio, [name] ([id number]) -------------------------------------------------------------------------------- Patient/Caregiver Education Details Patient Name: Tomczak, Caley E. Date of Service: 09/12/2018 9:30 AM Medical Record Number: [id number] Patient Account Number: [id number] Date of Birth/Gender: 07-04-1950 (68 y.o. M) Treating RN: Montey Hora Primary Care Physician: Lamonte Sakai Other Clinician: Referring Physician: Lamonte Sakai Treating Physician/Extender: Sharalyn Ink in Treatment: 103 Education Assessment Education Provided To: Patient Education Topics Provided Wound/Skin Impairment: Handouts: Other: wound care as ordered Methods: Demonstration, Explain/Verbal Responses: State content correctly Electronic Signature(s) Signed: 09/12/2018 4:32:34 PM By: Montey Hora Entered By: Montey Hora on 09/12/2018 10:06:34 Belding, Shion E. ([id number]) -------------------------------------------------------------------------------- Wound Assessment Details Patient Name: Wendel, Kory E. Date of Service: 09/12/2018 9:30 AM Medical Record Number: [id number] Patient Account Number: [id number] Date of Birth/Sex: 1950/10/22 (68 y.o.  M) Treating RN: Harold Barban Primary Care Kolsen Choe: Lamonte Sakai Other Clinician: Referring Adriaan Maltese: Lamonte Sakai Treating Jakeem Grape/Extender: Melburn Hake, HOYT Weeks in Treatment: 103 Wound Status Wound Number: 16 Primary Diabetic Wound/Ulcer of the Lower Extremity Etiology: Wound Location: Right Toe Great - Plantar Wound Open Wounding Event: Not Known Status: Date Acquired: 10/29/2017 Comorbid Anemia, Lymphedema, Congestive Heart Weeks Of Treatment: 45 History: Failure, Hypertension, Peripheral Venous Clustered Wound: No Disease, Type II Diabetes, Neuropathy Photos Wound Measurements Length: (cm) 0.9 % Reduction in Width: (cm) 1.3 % Reduction in Depth: (cm) 0.1 Epithelializat Area: (cm) 0.919 Tunneling: Volume: (cm) 0.092 Undermining: Area: 42.6% Volume: 42.5% ion: Small (1-33%) No No Wound Description Classification: Grade 1 Foul Odor Afte Wound Margin: Flat and Intact Slough/Fibrino Exudate Amount: Medium Exudate Type: Serous Exudate Color: amber r Cleansing: No Yes Wound Bed Granulation Amount: Large (67-100%) Exposed Structure Granulation Quality: Pink Fascia Exposed: No Necrotic Amount: Small (1-33%) Fat Layer (Subcutaneous Tissue) Exposed: Yes Necrotic Quality: Adherent Slough Tendon Exposed: No Muscle Exposed: No Joint Exposed: No Bone Exposed: No Treatment Notes Demonte, [name] E. ([id number]) Wound #16 (Right, Plantar Toe Great) Notes Nystatin Power, SilverCell secured with tape Electronic Signature(s) Signed: 09/12/2018 2:03:23 PM By: Harold Barban Entered By: Harold Barban on 09/12/2018 09:55:33 Spieth, [name] ([id number]) -------------------------------------------------------------------------------- Wound Assessment Details Patient Name: Tourigny, Real E. Date of Service: 09/12/2018 9:30 AM Medical Record Number: [id number] Patient Account Number: [id number] Date of Birth/Sex: [birth year] (68 y.o. M) Treating RN: Harold Barban Primary  Care Tiernan Millikin: Lamonte Sakai Other Clinician: Referring Aiyah Scarpelli: Humphrey Rolls,  Neelam Treating Corrie Reder/Extender: STONE III, HOYT Weeks in Treatment: 103 Wound Status Wound Number: 18 Primary Diabetic Wound/Ulcer of the Lower Extremity Etiology: Wound Location: Right Toe Great - Dorsal Wound Open Wounding Event: Gradually Appeared Status: Date Acquired: 05/22/2018 Comorbid Anemia, Lymphedema, Congestive Heart Weeks Of Treatment: 16 History: Failure, Hypertension, Peripheral Venous Clustered Wound: No Disease, Type II Diabetes, Neuropathy Photos Wound Measurements Length: (cm) 0.7 % Reduction in Width: (cm) 0.6 % Reduction in Depth: (cm) 0.1 Epithelializat Area: (cm) 0.33 Tunneling: Volume: (cm) 0.033 Undermining: Area: 58.8% Volume: 58.8% ion: Medium (34-66%) No No Wound Description Classification: Grade 1 Foul Odor Afte Wound Margin: Flat and Intact Slough/Fibrino Exudate Amount: Medium Exudate Type: Serous Exudate Color: amber r Cleansing: No No Wound Bed Granulation Amount: Large (67-100%) Exposed Structure Granulation Quality: Pink Fascia Exposed: No Necrotic Amount: None Present (0%) Fat Layer (Subcutaneous Tissue) Exposed: No Tendon Exposed: No Muscle Exposed: No Joint Exposed: No Bone Exposed: No Treatment Notes Netzley, Paulette E. ([id number]) Wound #18 (Right, Dorsal Toe Great) Notes Nystatin Power, SilverCell secured with tape Electronic Signature(s) Signed: 09/12/2018 2:03:23 PM By: Harold Barban Entered By: Harold Barban on 09/12/2018 09:56:07 Piatt, Millard E. ([id number]) -------------------------------------------------------------------------------- Wound Assessment Details Patient Name: Baker, Rumeal E. Date of Service: 09/12/2018 9:30 AM Medical Record Number: [id number] Patient Account Number: [id number] Date of Birth/Sex: 30-Jun-1950 (68 y.o. M) Treating RN: Harold Barban Primary Care Sheleen Conchas: Lamonte Sakai Other Clinician: Referring Hildegard Hlavac:  Lamonte Sakai Treating Shelitha Magley/Extender: Melburn Hake, HOYT Weeks in Treatment: 103 Wound Status Wound Number: 19 Primary Diabetic Wound/Ulcer of the Lower Extremity Etiology: Wound Location: Right Foot - Dorsal Wound Open Wounding Event: Gradually Appeared Status: Date Acquired: 09/05/2018 Comorbid Anemia, Lymphedema, Congestive Heart Weeks Of Treatment: 0 History: Failure, Hypertension, Peripheral Venous Clustered Wound: No Disease, Type II Diabetes, Neuropathy Photos Wound Measurements Length: (cm) 1 % Reduction Width: (cm) 1.5 % Reduction Depth: (cm) 0.1 Epitheliali Area: (cm) 1.178 Tunneling: Volume: (cm) 0.118 Underminin in Area: 0% in Volume: 0% zation: None No g: No Wound Description Classification: Grade 2 Wound Margin: Flat and Intact Wound Bed Granulation Amount: Small (1-33%) Exposed Structure Granulation Quality: Pale, Hyper-granulation Fascia Exposed: No Necrotic Amount: Large (67-100%) Fat Layer (Subcutaneous Tissue) Exposed: Yes Necrotic Quality: Adherent Slough Tendon Exposed: No Muscle Exposed: No Joint Exposed: No Bone Exposed: No Treatment Notes Wound #19 (Right, Dorsal Foot) Notes Gorton, Haidar E. ([id number]) Nystatin Power, SilverCell secured with tape Electronic Signature(s) Signed: 09/12/2018 2:03:23 PM By: Harold Barban Entered By: Harold Barban on 09/12/2018 09:56:53 Slaugh, [name] E. ([id number]) -------------------------------------------------------------------------------- Wound Assessment Details Patient Name: Hickson, Alisha E. Date of Service: 09/12/2018 9:30 AM Medical Record Number: [id number] Patient Account Number: [id number] Date of Birth/Sex: Aug 15, 1950 (68 y.o. M) Treating RN: Harold Barban Primary Care Kayia Billinger: Lamonte Sakai Other Clinician: Referring Vedanshi Massaro: Lamonte Sakai Treating Kamarrion Stfort/Extender: Melburn Hake, HOYT Weeks in Treatment: 103 Wound Status Wound Number: 2 Primary Diabetic Wound/Ulcer of the Lower  Extremity Etiology: Wound Location: Right Toe Second Wound Open Wounding Event: Gradually Appeared Status: Date Acquired: 06/11/2016 Comorbid Anemia, Lymphedema, Congestive Heart Weeks Of Treatment: 103 History: Failure, Hypertension, Peripheral Venous Clustered Wound: No Disease, Type II Diabetes, Neuropathy Pending Amputation On Presentation Photos Wound Measurements Length: (cm) 0.1 % Reduction i Width: (cm) 0.1 % Reduction i Depth: (cm) 0.1 Epithelializa Area: (cm) 0.008 Tunneling: Volume: (cm) 0.001 Undermining: n Area: 99.7% n Volume: 99.7% tion: Large (67-100%) No No Wound Description Classification: Grade 2 Foul Odor Aft Wound Margin: Indistinct, nonvisible Slough/Fibrin Exudate Amount: Small Exudate Type: Serous Exudate Color:  amber er Cleansing: No o No Wound Bed Granulation Amount: Large (67-100%) Exposed Structure Granulation Quality: Pink Fascia Exposed: No Necrotic Amount: None Present (0%) Fat Layer (Subcutaneous Tissue) Exposed: No Tendon Exposed: No Muscle Exposed: No Joint Exposed: No Bone Exposed: No Limited to Skin Breakdown Waltner, Abass E. ([id number]) Treatment Notes Wound #2 (Right Toe Second) Notes Nystatin Power, SilverCell secured with tape Electronic Signature(s) Signed: 09/12/2018 2:03:23 PM By: Harold Barban Entered By: Harold Barban on 09/12/2018 09:57:28 Attridge, [name] ([id number]) -------------------------------------------------------------------------------- Vitals Details Patient Name: Dabney, Audrick E. Date of Service: 09/12/2018 9:30 AM Medical Record Number: [id number] Patient Account Number: [id number] Date of Birth/Sex: 08/16/1950 (68 y.o. M) Treating RN: Harold Barban Primary Care Robert Sunga: Lamonte Sakai Other Clinician: Referring Jartavious Mckimmy: Lamonte Sakai Treating Khyli Swaim/Extender: Melburn Hake, HOYT Weeks in Treatment: 103 Vital Signs Time Taken: 09:45 Temperature (F): 98.2 Height (in): 69 Pulse (bpm):  62 Weight (lbs): 168 Respiratory Rate (breaths/min): 18 Body Mass Index (BMI): 24.8 Blood Pressure (mmHg): 187/82 Reference Range: 80 - 120 mg / dl Electronic Signature(s) Signed: 09/12/2018 2:03:23 PM By: Harold Barban Entered By: Harold Barban on 09/12/2018 09:45:47

## 2018-09-15 ENCOUNTER — Inpatient Hospital Stay: Payer: Medicare Other

## 2018-09-15 ENCOUNTER — Other Ambulatory Visit: Payer: Self-pay

## 2018-09-15 ENCOUNTER — Inpatient Hospital Stay: Payer: Medicare Other | Attending: Oncology | Admitting: Oncology

## 2018-09-15 ENCOUNTER — Encounter: Payer: Self-pay | Admitting: Oncology

## 2018-09-15 ENCOUNTER — Ambulatory Visit: Payer: Medicare Other

## 2018-09-15 VITALS — BP 180/78 | HR 73 | Temp 97.8°F | Wt 171.8 lb

## 2018-09-15 DIAGNOSIS — D472 Monoclonal gammopathy: Secondary | ICD-10-CM | POA: Diagnosis not present

## 2018-09-15 DIAGNOSIS — E611 Iron deficiency: Secondary | ICD-10-CM | POA: Insufficient documentation

## 2018-09-15 DIAGNOSIS — F17210 Nicotine dependence, cigarettes, uncomplicated: Secondary | ICD-10-CM | POA: Insufficient documentation

## 2018-09-15 DIAGNOSIS — E1122 Type 2 diabetes mellitus with diabetic chronic kidney disease: Secondary | ICD-10-CM | POA: Diagnosis not present

## 2018-09-15 DIAGNOSIS — Z79899 Other long term (current) drug therapy: Secondary | ICD-10-CM | POA: Diagnosis not present

## 2018-09-15 DIAGNOSIS — D631 Anemia in chronic kidney disease: Secondary | ICD-10-CM

## 2018-09-15 DIAGNOSIS — N189 Chronic kidney disease, unspecified: Secondary | ICD-10-CM

## 2018-09-15 DIAGNOSIS — K219 Gastro-esophageal reflux disease without esophagitis: Secondary | ICD-10-CM | POA: Diagnosis not present

## 2018-09-15 DIAGNOSIS — D509 Iron deficiency anemia, unspecified: Secondary | ICD-10-CM | POA: Diagnosis not present

## 2018-09-15 DIAGNOSIS — K7460 Unspecified cirrhosis of liver: Secondary | ICD-10-CM | POA: Insufficient documentation

## 2018-09-15 DIAGNOSIS — Z862 Personal history of diseases of the blood and blood-forming organs and certain disorders involving the immune mechanism: Secondary | ICD-10-CM

## 2018-09-15 DIAGNOSIS — I130 Hypertensive heart and chronic kidney disease with heart failure and stage 1 through stage 4 chronic kidney disease, or unspecified chronic kidney disease: Secondary | ICD-10-CM

## 2018-09-15 DIAGNOSIS — D649 Anemia, unspecified: Secondary | ICD-10-CM

## 2018-09-15 DIAGNOSIS — J449 Chronic obstructive pulmonary disease, unspecified: Secondary | ICD-10-CM | POA: Diagnosis not present

## 2018-09-15 DIAGNOSIS — I509 Heart failure, unspecified: Secondary | ICD-10-CM | POA: Diagnosis not present

## 2018-09-15 LAB — CBC
HCT: 28.9 % — ABNORMAL LOW (ref 39.0–52.0)
Hemoglobin: 9.5 g/dL — ABNORMAL LOW (ref 13.0–17.0)
MCH: 29.2 pg (ref 26.0–34.0)
MCHC: 32.9 g/dL (ref 30.0–36.0)
MCV: 88.9 fL (ref 80.0–100.0)
Platelets: 219 10*3/uL (ref 150–400)
RBC: 3.25 MIL/uL — ABNORMAL LOW (ref 4.22–5.81)
RDW: 13.5 % (ref 11.5–15.5)
WBC: 7.4 10*3/uL (ref 4.0–10.5)
nRBC: 0 % (ref 0.0–0.2)

## 2018-09-15 LAB — SAMPLE TO BLOOD BANK

## 2018-09-15 NOTE — Progress Notes (Signed)
Hematology/Oncology Consult note [facility]  [phone] [phone]  Patient Care Team: [name], MD as PCP - General (Internal Medicine) Sindy Guadeloupe, MD as Consulting Physician (Hematology and Oncology)   Name of the patient: [name]  [id number]  Jan 26, 1951   Date of visit: 09/15/18  Diagnosis- 1.Anemia- multifactorial- iron deficiency, chronic disease and anemia of kidney disease  2. 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. 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 Baruch Gouty. Patient had CT abdomen in July 2016 which showed retroperitoneal, celiac and gastric adenopathy. MR pelvis in Oct 2017 showed b/l inguinal and external iliac adenopathy as well. Inguinal LN biopsy was negative for malignancy.   2. Biopsy of the skin lesion showed: DIAGNOSIS:  A. PERIANAL POLYP, LEFT; EXCISION:  - SKIN WITH POLYPOID VASCULAR PROLIFERATION WITH SUPERFICIAL NECROSIS,  SEE COMMENT.   B. PERIANAL MASS, RIGHT; INCISIONAL BIOPSY:  - INVASIVE CARCINOMA WITH BASALOID FEATURES.  - SEE COMMENT.   Comment #1:  The differential diagnosis for the perianal polyp includes pyogenic  granuloma and ulcerated hemangioma.   Comment #2:  Immunohistochemical stains were performed. The carcinoma is positive for  p16, BerEp4, and BCL-2. Stain controls worked appropriately.  Unfortunately the pattern of staining does not assist with further  classification of the carcinoma. Clinical correlation is required to  determine if the lesion involves perianal skin primarily (BCC) or is  arising in the anal canal (squamous cell carcinoma with basaloid  features).   This was followed by RT to the lesion.  3. He had repeat CT abdomen in feb  2018 which showed: IMPRESSION: Progressive hepatic cirrhosis since prior study. No evidence of hepatic neoplasm.  New moderate ascites, diffuse mesenteric and body wall edema, and small bilateral pleural effusions. Mild diffuse small bowel wall thickening, consistent with hypoalbuminemia.  Findings consistent with portal venous hypertension, including mild splenomegaly and upper abdominal venous collaterals.  Stable mild abdominal lymphadenopathy. Mild increase in bilateral iliac and inguinal lymphadenopathy in the pelvis, which may be reactive in etiology, with metastatic disease considered less Likely.  4. Heunderwentegd and colonoscopy for his microcytic anemiawhich showed no overt bleeding lesions or malignancy  5. CBC on 09/18/2016 showed H&H of 6.5/19.8 and MCV of 86.7. Labs suggestive of iron deficiency anemia. B12 and folate was within normal limits. Multiple myeloma panel showed IGG monoclonal protein of 0.9 gm. Polyclonal gammopathyHaptoglobin and no copper was normal. Reticulocyte count was mildly elevated at 7.9. Patient received 2 doses of ferriheme on 620 and 10/03/2016. He also received 1 unit of blood transfusion  6. EGD showed a normal duodenum and normal esophagus. Found to have gastritis which was biopsied. Also found to have a large polypoid mass in the left piriform sinus and nonobstructing the airway. This was subsequently evaluated by ENT which did not find any evidence of mass. The colonoscopy showed appendixthat was appearing protruding and ordered this blood with stool. No mucus seen in the vicinity. He was evaluated by a [facility] and has been considered for a repeat colonoscopy versus endoscopy mucosal resection of the mucocele  7.Bonemarrowbiopsy showed hypercellular marrow for age. 9% plasma cells. Clonality of cells could not be evaluated because of lack of clot sections available for staining. Cytogenetics  were normal. FISH studies were  negative for any chromosomal abnormalities as well.  8. Patient received procrit for anemia of chronic kidney disease with improvement of his anemia to 10  Interval history-overall he is doing well.  Appetite and weight are stable.  Leg swelling has gone down.  He did not take his blood pressure medications this morning  ECOG PS- 1 Pain scale- 0   Review of systems- Review of Systems  Constitutional: Negative for chills, fever, malaise/fatigue and weight loss.  HENT: Negative for congestion, ear discharge and nosebleeds.   Eyes: Negative for blurred vision.  Respiratory: Negative for cough, hemoptysis, sputum production, shortness of breath and wheezing.   Cardiovascular: Negative for chest pain, palpitations, orthopnea and claudication.  Gastrointestinal: Negative for abdominal pain, blood in stool, constipation, diarrhea, heartburn, melena, nausea and vomiting.  Genitourinary: Negative for dysuria, flank pain, frequency, hematuria and urgency.  Musculoskeletal: Negative for back pain, joint pain and myalgias.  Skin: Negative for rash.  Neurological: Negative for dizziness, tingling, focal weakness, seizures, weakness and headaches.  Endo/Heme/Allergies: Does not bruise/bleed easily.  Psychiatric/Behavioral: Negative for depression and suicidal ideas. The patient does not have insomnia.        No Known Allergies   Past Medical History:  Diagnosis Date  . Anemia   . Cancer ([place]) 11/02/2014   PERIANAL MASS, RIGHT; INCISIONAL BIOPSY: INVASIVE CARCINOMA WITH BASALOID FEATURES  . CHF (congestive heart failure) ([place])   . Chronic kidney disease   . COPD (chronic obstructive pulmonary disease) ([place])    NO INHALERS  . Diabetes mellitus    NO MEDS  . GERD (gastroesophageal reflux disease)   . Hepatic cirrhosis ([place])   . Hypertension   . Leg swelling   . Loose, teeth    PATIENT STATES "HAS 6 TEETH, SOME ARE LOOSE"  . Neuromuscular disorder ([place])    RIGHT HAND AND FINGERS NUMB   . Shortness of breath dyspnea    WITH EXERTION   . 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 02/24/2016     Past Surgical History:  Procedure Laterality Date  . APPENDECTOMY    . COLONOSCOPY WITH PROPOFOL N/A 11/19/2014   Procedure: COLONOSCOPY WITH PROPOFOL;  Surgeon: Lucilla Lame, MD;  Location: [place];  Service: Endoscopy;  Laterality: N/A;  DIABETIC-ORAL MEDS  . COLONOSCOPY WITH PROPOFOL N/A 10/04/2016   Procedure: COLONOSCOPY WITH PROPOFOL;  Surgeon: Jonathon Bellows, MD;  Location: [facility] ENDOSCOPY;  Service: Endoscopy;  Laterality: N/A;  . ESOPHAGOGASTRODUODENOSCOPY (EGD) WITH PROPOFOL N/A 10/04/2016   Procedure: ESOPHAGOGASTRODUODENOSCOPY (EGD) WITH PROPOFOL;  Surgeon: Jonathon Bellows, MD;  Location: [facility] ENDOSCOPY;  Service: Endoscopy;  Laterality: N/A;  . GIVENS CAPSULE STUDY N/A 11/13/2016   Procedure: GIVENS CAPSULE STUDY;  Surgeon: Jonathon Bellows, MD;  Location: [facility] ENDOSCOPY;  Service: Gastroenterology;  Laterality: N/A;  . INCISION AND DRAINAGE PERIRECTAL ABSCESS Right 12/16/2017   Procedure: EXCISION BASAL CELL CANCER/ GLUTEAL ABSCESS AND FLAP COVERAGE;  Surgeon: Robert Bellow, MD;  Location: [facility];  Service: General;  Laterality: Right;  . INGUINAL LYMPH NODE BIOPSY Right 01/26/2016   Procedure: INGUINAL LYMPH NODE BIOPSY;  Surgeon: Clayburn Pert, MD;  Location: [facility];  Service: General;  Laterality: Right;  . RECTAL BIOPSY N/A 11/02/2014   Procedure: BIOPSY RECTAL;  Surgeon: Marlyce Huge, MD;  Location: [facility];  Service: General;  Laterality: N/A;  . RECTAL EXAM UNDER ANESTHESIA N/A 11/02/2014   Procedure: RECTAL EXAM UNDER ANESTHESIA;  Surgeon: Marlyce Huge, MD;  Location: [facility];  Service: General;  Laterality: N/A;  . TONSILLECTOMY      Social History   Socioeconomic History  . Marital status: Divorced    Spouse name: Not on file  . Number of children: Not on file  . Years of education: Not on file  . Highest education level: Not on file   Occupational History  . Not on file  Social Needs  . Financial resource strain: Somewhat hard  . Food insecurity:    Worry: Patient refused    Inability: Patient refused  . Transportation needs:    Medical: Patient refused    Non-medical: Patient refused  Tobacco Use  . Smoking status: Current Every Day Smoker    Packs/day: 0.25    Years: 20.00    Pack years: 5.00    Types: Cigarettes  . Smokeless tobacco: Never Used  . Tobacco comment: 3-4 cigarettes per day  Substance and Sexual Activity  . Alcohol use: Not Currently  . Drug use: Not Currently    Comment: Last Use 2016 per patient-+ UDS FOR COCAINE IN 2014  . Sexual activity: Never  Lifestyle  . Physical activity:    Days per week: 0 days    Minutes per session: 0 min  . Stress: To some extent  Relationships  . Social connections:    Talks on phone: More than three times a week    Gets together: More than three times a week    Attends religious service: More than 4 times per year    Active member of club or organization: Yes    Attends meetings of clubs or organizations: More than 4 times per year    Relationship status: Divorced  . Intimate partner violence:    Fear of current or ex partner: Patient refused    Emotionally abused: Patient refused    Physically abused: Patient refused    Forced sexual activity: Patient refused  Other Topics Concern  . Not on file  Social History Narrative  . Not on file    Family History  Problem Relation Age of Onset  . Asthma Mother   . Colon cancer Neg Hx      Current Outpatient Medications:  .  amLODipine (NORVASC) 10 MG tablet, Take 10 mg by mouth 2 (two) times daily. , Disp: , Rfl:  .  atorvastatin (LIPITOR) 40 MG tablet, Take 40 mg by mouth every morning. , Disp: , Rfl:  .  bismuth subsalicylate (PEPTO BISMOL) 262 MG/15ML suspension, Take 30 mLs by mouth every 6 (six) hours as needed for indigestion or diarrhea or loose stools. , Disp: , Rfl:  .  docusate sodium  (COLACE) 100 MG capsule, Take 1 capsule (100 mg total) by mouth 2 (two) times daily., Disp: 10 capsule, Rfl: 0 .  ferrous sulfate 325 (65 FE) MG tablet, Take 325 mg by mouth daily. , Disp: , Rfl:  .  hydrALAZINE (APRESOLINE) 25 MG tablet, Take 25 mg 2 (two) times daily by mouth., Disp: , Rfl:  .  omeprazole (PRILOSEC) 40 MG capsule, Take 40 mg by mouth every morning. , Disp: , Rfl:  .  polyethylene glycol (MIRALAX / GLYCOLAX) packet, Take 17 g by mouth daily as needed for mild constipation. , Disp: , Rfl:  .  sodium bicarbonate 650 MG tablet, Take 1 tablet (650 mg total) by mouth 3 (three) times daily., Disp: 20 tablet, Rfl: 0 .  torsemide (DEMADEX) 20 MG tablet, Take 20 mg by mouth daily., Disp: , Rfl:  .  vitamin B-12 (CYANOCOBALAMIN) 1000 MCG tablet, Take 1 tablet (1,000 mcg total) by mouth daily., Disp: 30 tablet, Rfl: 3 .  Vitamin D, Ergocalciferol, (DRISDOL) 50000 units CAPS capsule, Take 50,000 Units by mouth every Tuesday. , Disp: , Rfl:  No current facility-administered medications for this visit.   Facility-Administered Medications Ordered in Other Visits:  .  epoetin alfa-epbx (RETACRIT) injection 10,000 Units, 10,000 Units, Subcutaneous, Once **AND** [DISCONTINUED] epoetin alfa-epbx (RETACRIT) injection 40,000 Units, 40,000 Units, Subcutaneous, Once, Sindy Guadeloupe, MD  Physical exam:  Vitals:   09/15/18 0948  BP: (!) 180/78  Pulse: 73  Temp: 97.8 F (36.6 C)  TempSrc: Tympanic  Weight: 171 lb 12.8 oz (77.9 kg)   Physical Exam HENT:     Head: Normocephalic and atraumatic.  Eyes:     Pupils: Pupils are equal, round, and reactive to light.  Neck:     Musculoskeletal: Normal range of motion.  Cardiovascular:     Rate and Rhythm: Normal rate and regular rhythm.     Heart sounds: Normal heart sounds.  Pulmonary:     Effort: Pulmonary effort is normal.     Breath sounds: Normal breath sounds.  Abdominal:     General: Bowel sounds are normal.     Palpations: Abdomen is  soft.  Skin:    General: Skin is warm and dry.  Neurological:     Mental Status: He is alert and oriented to person, place, and time.      CMP Latest Ref Rng & Units 01/24/2018  Glucose 70 - 99 mg/dL 134(H)  BUN 8 - 23 mg/dL 68(H)  Creatinine 0.61 - 1.24 mg/dL 3.28(H)  Sodium 135 - 145 mmol/L 135  Potassium 3.5 - 5.1 mmol/L 4.9  Chloride 98 - 111 mmol/L 110  CO2 22 - 32 mmol/L 19(L)  Calcium 8.9 - 10.3 mg/dL 8.3(L)  Total Protein 6.5 - 8.1 g/dL -  Total Bilirubin 0.3 - 1.2 mg/dL -  Alkaline Phos 38 - 126 U/L -  AST 15 - 41 U/L -  ALT 0 - 44 U/L -   CBC Latest Ref Rng & Units 09/15/2018  WBC 4.0 - 10.5 K/uL 7.4  Hemoglobin 13.0 - 17.0 g/dL 9.5(L)  Hematocrit 39.0 - 52.0 % 28.9(L)  Platelets 150 - 400 K/uL 219     Assessment and plan- Patient is a 68 y.o. male with anemia multifactorial secondary to iron deficiency as well as anemia of chronic kidney disease and MGUS.  This is a routine follow-up visit for anemia  Patient is doing well on Retacrit which he will continue to get every 3 weeks.  His blood pressure is elevated at 180/78 today.  He did not take his blood pressure medications this morning.  Patient is willing to take his blood pressure medications tomorrow on time and come back for his Retacrit shot tomorrow.  He will also continue to get adequate shots at 3 weeks 6 weeks 9 weeks and 12 weeks and I will see him back in 12 weeks with CBC ferritin and iron studies.  Patient does have IgG lambda MGUS.  Recent labs from May 2020 showed M protein was stable around 1.1.  Both kappa and lambda light chains are elevated likely secondary to CKD with a normal ratio of 1.45.  Continue to monitor   Visit Diagnosis 1. History of anemia due to chronic kidney disease   2. MGUS (monoclonal gammopathy of unknown significance)   3. Erythropoietin (EPO) stimulating agent anemia management patient  Dr. Randa Evens, MD, MPH [facility] [phone]  09/15/2018 10:14 AM

## 2018-09-16 ENCOUNTER — Inpatient Hospital Stay: Payer: Medicare Other

## 2018-09-16 ENCOUNTER — Ambulatory Visit: Payer: Medicare Other

## 2018-09-16 ENCOUNTER — Other Ambulatory Visit: Payer: Self-pay

## 2018-09-16 VITALS — BP 135/69 | HR 85

## 2018-09-16 DIAGNOSIS — I130 Hypertensive heart and chronic kidney disease with heart failure and stage 1 through stage 4 chronic kidney disease, or unspecified chronic kidney disease: Secondary | ICD-10-CM | POA: Diagnosis not present

## 2018-09-16 DIAGNOSIS — D509 Iron deficiency anemia, unspecified: Secondary | ICD-10-CM

## 2018-09-16 MED ORDER — EPOETIN ALFA-EPBX 40000 UNIT/ML IJ SOLN
40000.0000 [IU] | Freq: Once | INTRAMUSCULAR | Status: AC
  Administered 2018-09-16: 40000 [IU] via SUBCUTANEOUS
  Filled 2018-09-16: qty 1

## 2018-09-16 MED ORDER — EPOETIN ALFA-EPBX 40000 UNIT/ML IJ SOLN: 50000.0000 [IU] | Freq: Once | INTRAMUSCULAR | Status: DC

## 2018-09-16 MED ORDER — EPOETIN ALFA-EPBX 10000 UNIT/ML IJ SOLN
10000.0000 [IU] | Freq: Once | INTRAMUSCULAR | Status: AC
  Administered 2018-09-16: 10000 [IU] via SUBCUTANEOUS
  Filled 2018-09-16: qty 1

## 2018-09-23 ENCOUNTER — Ambulatory Visit: Payer: Medicare Other

## 2018-09-23 ENCOUNTER — Ambulatory Visit (INDEPENDENT_AMBULATORY_CARE_PROVIDER_SITE_OTHER): Payer: Medicare Other | Admitting: General Surgery

## 2018-09-23 ENCOUNTER — Other Ambulatory Visit: Payer: Self-pay

## 2018-09-23 ENCOUNTER — Encounter: Payer: Self-pay | Admitting: General Surgery

## 2018-09-23 VITALS — BP 168/93 | HR 69 | Temp 97.7°F | Resp 18 | Ht 70.0 in | Wt 177.0 lb

## 2018-09-23 DIAGNOSIS — C44519 Basal cell carcinoma of skin of other part of trunk: Secondary | ICD-10-CM

## 2018-09-23 DIAGNOSIS — L723 Sebaceous cyst: Secondary | ICD-10-CM | POA: Diagnosis not present

## 2018-09-23 NOTE — Patient Instructions (Signed)
The patient is aware to call back for any questions or new concerns.  

## 2018-09-23 NOTE — Progress Notes (Signed)
Patient ID: [name], male   DOB: Feb 19, 1951, 68 y.o.   MRN: [id number]  Chief Complaint  Patient presents with  . Follow-up    basel cell gluteal flap    HPI [name] is a 68 y.o. male.  Here for follow up basel cell gluteal flap, he denies any concerns.   HPI  Past Medical History:  Diagnosis Date  . Anemia   . Cancer ([place]) 11/02/2014   PERIANAL MASS, RIGHT; INCISIONAL BIOPSY: INVASIVE CARCINOMA WITH BASALOID FEATURES  . CHF (congestive heart failure) ([place])   . Chronic kidney disease   . COPD (chronic obstructive pulmonary disease) ([place])    NO INHALERS  . Diabetes mellitus    NO MEDS  . GERD (gastroesophageal reflux disease)   . Hepatic cirrhosis ([place])   . Hypertension   . Leg swelling   . Loose, teeth    PATIENT STATES "HAS 6 TEETH, SOME ARE LOOSE"  . Neuromuscular disorder ([place])    RIGHT HAND AND FINGERS NUMB  . Shortness of breath dyspnea    WITH EXERTION   . 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 APPENDECTOMY    . COLONOSCOPY WITH PROPOFOL N/A 11/19/2014   Procedure: COLONOSCOPY WITH PROPOFOL;  Surgeon: Lucilla Lame, MD;  Location: [place];  Service: Endoscopy;  Laterality: N/A;  DIABETIC-ORAL MEDS  . COLONOSCOPY WITH PROPOFOL N/A 10/04/2016   Procedure: COLONOSCOPY WITH PROPOFOL;  Surgeon: Jonathon Bellows, MD;  Location: [facility] ENDOSCOPY;  Service: Endoscopy;  Laterality: N/A;  . ESOPHAGOGASTRODUODENOSCOPY (EGD) WITH PROPOFOL N/A 10/04/2016   Procedure: ESOPHAGOGASTRODUODENOSCOPY (EGD) WITH PROPOFOL;  Surgeon: Jonathon Bellows, MD;  Location[facility] ENDOSCOPY;  Service: Endoscopy;  Laterality: N/A;  . GIVENS CAPSULE STUDY N/A 11/13/2016   Procedure: GIVENS CAPSULE STUDY;  Surgeon: Jonathon Bellows, MD;  Location: [facility] ENDOSCOPY;  Service: Gastroenterology;  Laterality: N/A;  . INCISION AND DRAINAGE PERIRECTAL ABSCESS Right 12/16/2017   Procedure: EXCISION BASAL CELL CANCER/ GLUTEAL ABSCESS AND FLAP  COVERAGE;  Surgeon: Robert Bellow, MD;  Location: [facility];  Service: General;  Laterality: Right;  . INGUINAL LYMPH NODE BIOPSY Right 01/26/2016   Procedure: INGUINAL LYMPH NODE BIOPSY;  Surgeon: Clayburn Pert, MD;  Location: [facility];  Service: General;  Laterality: Right;  . RECTAL BIOPSY N/A 11/02/2014   Procedure: BIOPSY RECTAL;  Surgeon: Marlyce Huge, MD;  Location: [facility];  Service: General;  Laterality: N/A;  . RECTAL EXAM UNDER ANESTHESIA N/A 11/02/2014   Procedure: RECTAL EXAM UNDER ANESTHESIA;  Surgeon: Marlyce Huge, MD;  Location: [facility];  Service: General;  Laterality: N/A;  . TONSILLECTOMY      Family History  Problem Relation Age of Onset  . Asthma Mother   . Colon cancer Neg Hx     Social History Social History   Tobacco Use  . Smoking status: Current Every Day Smoker    Packs/day: 0.25    Years: 20.00    Pack years: 5.00    Types: Cigarettes  . Smokeless tobacco: Never Used  . Tobacco comment: 3-4 cigarettes per day  Substance Use Topics  . Alcohol use: Not Currently  . Drug use: Not Currently    Comment: Last Use 2016 per patient-+ UDS FOR COCAINE IN 2014    No Known Allergies  Current Outpatient Medications  Medication Sig Dispense Refill  . amLODipine (NORVASC) 10 MG tablet Take 10 mg by mouth 2 (two) times daily.     Marland Kitchen  atorvastatin (LIPITOR) 40 MG tablet Take 40 mg by mouth every morning.     . bismuth subsalicylate (PEPTO BISMOL) 262 MG/15ML suspension Take 30 mLs by mouth every 6 (six) hours as needed for indigestion or diarrhea or loose stools.     . docusate sodium (COLACE) 100 MG capsule Take 1 capsule (100 mg total) by mouth 2 (two) times daily. 10 capsule 0  . ferrous sulfate 325 (65 FE) MG tablet Take 325 mg by mouth daily.     . furosemide (LASIX) 20 MG tablet     . hydrALAZINE (APRESOLINE) 25 MG tablet Take 25 mg 2 (two) times daily by mouth.    Marland Kitchen omeprazole (PRILOSEC) 40 MG capsule Take 40 mg by mouth every  morning.     . polyethylene glycol (MIRALAX / GLYCOLAX) packet Take 17 g by mouth daily as needed for mild constipation.     . sodium bicarbonate 650 MG tablet Take 1 tablet (650 mg total) by mouth 3 (three) times daily. 20 tablet 0  . torsemide (DEMADEX) 20 MG tablet Take 20 mg by mouth daily.    . vitamin B-12 (CYANOCOBALAMIN) 1000 MCG tablet Take 1 tablet (1,000 mcg total) by mouth daily. 30 tablet 3  . Vitamin D, Ergocalciferol, (DRISDOL) 50000 units CAPS capsule Take 50,000 Units by mouth every Tuesday.      No current facility-administered medications for this visit.    Facility-Administered Medications Ordered in Other Visits  Medication Dose Route Frequency Provider Last Rate Last Dose  . epoetin alfa-epbx (RETACRIT) injection 10,000 Units  10,000 Units Subcutaneous Once Sindy Guadeloupe, MD        Review of Systems Review of Systems  Constitutional: Negative.   Respiratory: Negative.   Cardiovascular: Negative.     Blood pressure (!) 168/93, pulse 69, temperature 97.7 F (36.5 C), temperature source Temporal, resp. rate 18, height 5\' 10"  (1.778 m), weight 177 lb (80.3 kg), SpO2 97 %.  Physical Exam Physical Exam Exam conducted with a chaperone present.  Constitutional:      Appearance: Normal appearance.  Genitourinary:      Comments: Healed  Lymphadenopathy:     Lower Body: No right inguinal adenopathy. No left inguinal adenopathy.  Skin:    General: Skin is warm and dry.     Comments: sebaceous cyst left cheek  Neurological:     Mental Status: He is alert and oriented to person, place, and time.       Data Reviewed Hematology notes of September 15, 2018 reviewed.  Assessment Doing well post excisional therapy for recurrent squamous cell carcinoma.  Facial cyst.  Not inflamed at present.  Plan  Office excision of facial cyst.  Repeat anal exam in 6 months.  The patient is aware to call back for any questions or new concerns.   HPI, assessment, plan and  physical exam has been scribed under the direction and in the presence of Robert Bellow, MD. Karie Fetch, RN  I have completed the exam and reviewed the above documentation for accuracy and completeness.  I agree with the above.  Haematologist has been used and any errors in dictation or transcription are unintentional.  Hervey Ard, M.D., F.A.C.S.  Forest Gleason Adrielle Polakowski 09/24/2018, 3:16 PM

## 2018-09-24 DIAGNOSIS — L723 Sebaceous cyst: Secondary | ICD-10-CM | POA: Insufficient documentation

## 2018-09-25 ENCOUNTER — Encounter: Payer: Medicare Other | Admitting: Physician Assistant

## 2018-09-25 ENCOUNTER — Other Ambulatory Visit: Payer: Self-pay

## 2018-09-25 DIAGNOSIS — E11621 Type 2 diabetes mellitus with foot ulcer: Secondary | ICD-10-CM | POA: Diagnosis not present

## 2018-09-26 ENCOUNTER — Ambulatory Visit: Payer: Medicare Other | Admitting: Physician Assistant

## 2018-09-26 NOTE — Progress Notes (Signed)
[name] ([id number]) Visit Report for 09/25/2018 Chief Complaint Document Details Patient Name: [name], [name] Date of Service: 09/25/2018 9:00 AM Medical Record Number: [id number] Patient Account Number: [id number] Date of Birth/Sex: Aug 14, 1950 (68 y.o. M) Treating RN: Army Melia Primary Care Provider: Lamonte Sakai Other Clinician: Referring Provider: Lamonte Sakai Treating Provider/Extender: Melburn Hake, HOYT Weeks in Treatment: 104 Information Obtained from: Patient Chief Complaint Patient presents for treatment of an open diabetic ulcer to both feet Electronic Signature(s) Signed: 09/26/2018 1:16:38 PM By: Worthy Keeler PA-C Entered By: Worthy Keeler on 09/25/2018 09:35:16 [name], [name] ([id number]) -------------------------------------------------------------------------------- HPI Details Patient Name: [name], [name] Date of Service: 09/25/2018 9:00 AM Medical Record Number: [id number] Patient Account Number: [id number] Date of Birth/Sex: 1951/01/26 (68 y.o. M) Treating RN: Army Melia Primary Care Provider: Lamonte Sakai Other Clinician: Referring Provider: Lamonte Sakai Treating Provider/Extender: Melburn Hake, HOYT Weeks in Treatment: 104 History of Present Illness HPI Description: 68 year old patient here to see as for bilateral feet ulceration to on his left first and second toe and 2 on his right first and second toe, which she's had for about 4 months. 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 Donella Stade. The patient is also undergoing workup by medical oncology for a intra-abdominal lymphadenopathy. 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 He is also status post appendectomy, inguinal lymph node biopsy, rectal biopsy and  rectal examination under anesthesia. he currently smokes cigarettes about half packet a day. In March of this year he was seen by Dr. Hortencia Pilar, for evaluation of bilateral varicose veins and besides wearing compression stockings he had recommended laser ablation of the right and left great saphenous veins to eleviate the symptoms and complications of severe superficial venous reflux disease. He also recommended lymphedema pumps for better control of his lymphedema. The patient recently has had on 08/23/2016, right greater saphenous vein ablation with the laser energy Earlier lower extremity venous reflux examination done on 05/08/2016 showed no DVT or SVT both lower legs but incompetence of bilateral great saphenous veins was present. A lower arterial study was also done and there was no significant right lower and left lower extremity problems based on a normal toe brachial index bilaterally and the ABI was 1.21 the left and 1.23 on the right. His post ablation venous duplex examination showed successful ablation of the right GS vein with thrombus formation 2 below the right saphenofemoral junction. The deep system was patent without evidence of thrombosis and this was done on 08/30/2016. 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 Addendum: regarding his x-rays done today and x-ray of the left foot -- IMPRESSION: No objective evidence of osteomyelitis. There are soft tissue changes which may reflect cellulitis. X-ray of the right foot -- IMPRESSION:Findings compatible with cellulitis of the toes. No objective evidence of osteomyelitis is observed. 10/01/16 on evaluation today patient's wounds appeared to be doing some better. I did review the x-rays as well which showed no evidence of osteomyelitis although there was evidence on x-ray of cellulitis. He fortunately is not having  any discomfort although he continues to have some swelling. He does not remember being on any antibiotics  recently. 10/15/16 on evaluation today patient's wounds overall appear to be doing better although he does have a new location noted on the left foot. Fortunately he is not having significant pain. It almost has the appearance that something is rubbing on the end of his toes but he wears the open toe shoes and according to what he is telling me never wears anything that would rub on his foot. There is no evidence of infection and specifically no evidence of a fungal infection 10/22/16 On evaluation today patient's wounds appeared to be doing better compared to last week in regard to his bilateral lower extremities. Fortunately I happy with how things are progressing although he still has ulcers I feel like that he is improving and appropriate manner. 11/12/16 on evaluation today patient appears to be doing well in regard to his bilateral feet and the respective wounds. We have been using surrounding her dressings along with an antifungal cream which seems to be doing very well. He has no bilateral dysfunction noticed that the rituals are weight loss at this point. He also has no nausea or vomiting a note purulent discharge. He did see Vein and vascular today and he tells me that they told him he could have surgery for his venous stasis but they did not feel like it was worth it in his words. Fortunately patient's wounds do appear to be getting sneakily better. [name] ([id number]) 11/26/2016 -- he says he is going to have some surgery during this week at [facility] for possibly a colon resection. 12/31/2016 -- the patient has been noncompliant with his smoking and I'm not sure whether he is also started drinking again. He continues to be very nonchalant about his care 01/14/2017 -- the patient's HandP has been reviewed well and I understand he is being compliant with trying to give up  smoking and his local dressing changes. He does not have any surgical options of 4 to him by his vascular surgeons.he was last seen in early August by Dr. Hortencia Pilar who recommended compression stockings,and possibly lymph pumps in 2-3 months after doing a review ultrasound. 01/28/2017 - the patient did not have any fresh complaints but on examination I noted a large lacerated wound on the plantar aspect of his right fourth toe which had a lot of necrotic debris and it probes down to bone. 02/07/2017 -- x-ray of the right foot -- IMPRESSION: Soft tissue swelling about the first through fourth toes consistent with cellulitis. New destructive change in the tuft of the distal phalanx of the great toe is consistent with osteomyelitis. 02/14/2017 -- the patient's MRI is pending this coming Monday and he still continues to smoke. We have again gone over off loading of his wounds in great detail and he says he's been compliant. 02/21/2017 -- MR of the right foot -- IMPRESSION: 1. Soft tissue ulcer at the tip of the first, second and third toe knows. Cortical irregularity and bone marrow edema in the first distal phalanx most concerning for osteomyelitis. Mild marrow edema in the second and third distal phalanx without definite cortical destruction which may reflect early osteomyelitis versus reactive marrow edema. 2. Soft tissue edema surrounding the first phalanx most consistent with cellulitis. the patient was also recently evaluated by his medical oncologist Dr. 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 She is treating him with weekly Procrit. She is also keeping intra-abdominal lymphadenopathy and right lower  lobe lung nodule under observation. 04/04/2017 -- he was seen by Dr. Adrian Prows on 03/25/2017 -- after review he empirically put him on ciprofloxacin and doxycycline as they have good bone penetration and good  bioavailability and it will cover the usual pathogens and diabetic foot osteomyelitis. He will check inflammatory markers and plan a 67-89 week old records. C-reactive protein was 0.3 and the ESR was 72 04/18/17 on evaluation today patient appears to be doing about the same in regard to his lower extremity wounds bilaterally. He has continued to use the antifungal cream which does seem to be beneficial. Nonetheless the ulcers do seem to in some areas be epithelial eyes over and in other areas are still open. He is having no significant discomfort. 04/25/17-he is here in follow-up evaluation for multiple ulcerations to multiple toes bilaterally. He states he did see Dr. Ola Spurr again last week and continues antibiotic therapy. He is voicing no complaints or concerns, will continue with current treatment plan will possibility of adding compression therapy next week after an additional week of treatment/lotions to BLE prescribed by Dr Ola Spurr 05/02/17 he is here in follow up for for multiple ulcers to multiple toes bilaterally. we will stop using antifungal cream and will continue with silvercel and follow up next week 05/09/17-he is here in follow-up for multiple ulcerations to multiple toes bilaterally. There is improvement in appearance. He has not completely stopped using antifungal cream, but admits he has not using it between the toes. He has an appointment with Dr. Ola Spurr on 2/11, continues on doxycycline and Cipro. It has been 5 weeks of antibiotic therapy, we will order plain film xray to evaluate for osteomyelitis next week, prior to follow up with ID. Will continue with silvercel and follow up next week 05/16/17-he is here in follow-up evaluation for multiple ulcerations to multiple toes bilaterally and new wound to the right posterior heel. There is essentially no change in appearance, deteriorating measurements; he has a history of waxing and waning measurements. He admits that he  continues to apply moisturizer/cream/ointment to his toes despite weekly reminders to only apply silvercel to his toes. He states that he thinks the surgical shoe contributed to the superficial ulcer to his posterior heel, he is unable to articulate if this was an area of dry cracked skin as he has a similar area to the left heel. He now is wearing open toed slippers. He has an appointment with Dr. Ola Spurr on 2/11. We have ordered x-rays for her bilateral feet; he was advised to obtain the x-rays today or tomorrow. He will follow-up next week 05/23/17-he is here in follow-up evaluation for multiple ulcerations to multiple toes bilaterally and the right posterior heel. There is improvement in maceration. He has been compliant and not applying any moisturizing agent to his toes. He has been using Lac-Hydrin for his lower extremities with improvement. He did not go to his appointment on Monday with Dr. Ola Spurr secondary to financial concerns. X-rays for her bilateral feet showed: LEFT FOOT with slight erosion of the tuft of the distal phalanges of the left first and second toe suspicious for osteomyelitis, RIGHT FOOT with 1.erosion of the tufts of the distal [name], [name] ([id number]) phalanges of the right first second and possibly third toes consistent with osteomyelitis, 2 no definitive abnormality of the calcaneus is seen on the images obtained, 3. Plantar calcaneal degenerative spur. We briefly discussed hyperbaric adjunctive therapy for treatment of chronic refractory osteomyelitis. I do not find an a1c in  EMR, will contact PCP for record, or order if needed. He has been encouraged to contact Dr Ola Spurr office regarding the follow-up appointment, encouraged him to inquire about payment plan. We will continue with same treatment plan and follow-up next week. He states he is still taking antibiotics and has "a lot" left. He states he has been taking them as directed, 2 pills twice daily.  According to Dr. Blane Ohara office notes he was originally started on 12/17 for 4 weeks and extended on 1/14 for an additional 4 weeks. He should be done with his antibiotic therapy, he was advised to bring his bottles and to his next appointment, we will contact pharmacy. 05/30/17-he is here in follow-up evaluation for multiple ulcerations to multiple toes bilaterally and the right posterior heel. He is accompanied by his brother-in-law. Wounds are stable. He has yet to make up with Dr. Ola Spurr. We contacted his PCP, with no record of recent A1c we will draw an A1c. His brother-in-law states that he was taken off all of his diabetic medication secondary to kidney function. 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 The antibiotics were counted and have approximately 2 weeks left, although they should be complete. The pill organizer reveals missing days. We discussed the need for consistent medications, to have optimal benefit of medication. He has a cousin that lives with him and he will ask her to check his organizer daily. He has been advised to follow up with Dr Ola Spurr, and will go by the office today. He has been advised to quit smoking. 06/06/17-he is here in follow up evaluation. He has had to make an appointment with Dr. Ola Spurr. He did have blood work obtained, a1c 5. He continues to take antibiotic therapy. Significant improvement in bilateral lower extremity edema with compression therapy. Essentially no change in ulcerations to toes. He states he is "going to try something different" and "let me know next week" if it works; he would not provide any additional information and was encouraged to follow our orders. We will follow up next week 06/13/17-he is here in follow-up evaluation. He has an appointment with Dr. Ola Spurr tomorrow morning. He states he purchased an ointment from  [facility] and applied to his toes for 3 days, he does not remember the name of the ointment. There is improvement to his wounds, minimal maceration. He continues to take antibiotic therapy, this should have been completed last month. His brother-in-law who regularly accompanies his appointments was asked to take the bottles to the appointment tomorrow with Dr. Ola Spurr so he is aware. We will continue with 3 layer compression, and order OPEN TOE compression 20-30mmHg; we will apply compression stockings next week. He continues to smoke, smoked "2 cigarettes" last week 06/20/17 on evaluation today patient did receive his compression stockings which he has with him today for both lower extremities. With that being said he tells me at this point in time that he is very happy to have these he really is not a big fan of the compression wraps that we have been utilizing although they have been of great benefit for him. Nonetheless at this point he does want to switch to the compression stockings. In my opinion as long as he is continuing with compression I'm okay with the stockings or the wraps. 06/27/17-he is here in follow-up evaluation for multiple ulcerations to his bilateral toes. There is some improvement in appearance. He is compliant  in wearing his compression stockings with significant improvement in lower extremity edema. He saw Dr Ola Spurr on 3/8, per his notes they would redraw ESR and CRP; plan to continue antibiotic therapy if these remain elevated. I do not see an ESR or CRP level in Epic. The patient continues to take antibiotics. 07/11/17-He is here in follow-up evaluation for multiple ulcerations to multiple toes bilaterally. He presents with complete epithelialization to the right third toe; there has been no deterioration. He continues on antibiotic therapy. He will follow-up next week 07/18/17-He is here in follow-up evaluation for multiple ulcerations to multiple toes bilaterally. He  continues to make improvement. He continues on antibiotic therapy. He states he has been using something additional to our orders, he does not elaborate but states he will bring it in next week. 07/25/17-He is here in follow up evaluation for multiple ulcerations to bilateral toes. He is stable. He has completed antibiotic therapy. He admits to "filing" his toes after showers each evening, this is what he was referencing last week; he does not filing for the wounds. We will switch to [facility] and monitor for any improvement, he will follow-up next week 08/01/17-He is here in follow-up evaluation. He admits to "picking" at his toes after cleansing yesterday, leading to new areas of tissue loss on the bilateral second toe. There is improvement noted to the bilateral great toe. We will dress toes today and hope that they maintain until Monday where he will come in for a nurse visit. He has been advised, multiple times with expressed verbalization, to change the dressings to silvercel if the dressings get wet prior to [facility] appointment. 08/08/17-He is here in follow-up evaluation for bilateral first and second toe ulcerations. There is significant improvement to all ulcerations since last visit. We will switch to [name] to all wounds and he will continue with nurse visits on a Monday/Thursday schedule and follow-up with me in 2 weeks. He continues to smoke, 1-3 cigarettes per day, and has been encouraged to not smoke until his next follow-up in 2 weeks. 08/15/17 on evaluation today patient's ulcers on his toes actually appear to be doing fairly well the right to ulcers may be a little bit more moist compared to the left I'm not really sure exactly why as the openings appear to be very small and I'm not seeing any evidence of anything significant as far as you lightest or otherwise. Nonetheless this may just be a small setback he's [name], [name] ([id number]) been doing very well with the  [facility] Dressing 08/22/17-He is here in follow-up evaluation for ulcerations to his bilateral first and second toes, there is small/scant amount of drainage noted on today's dressing. He continues with Hydrofera Blue. He continues to smoke, 1 cigarette a day. Voices no complaint or concerns, compliant and compression therapy today. He'll follow-up next week 08/29/17-He is here in follow-up evaluation for ulcerations to his bilateral first and second toes, with a new wound to the left third toe. He states he cut himself while cutting the toenail. He presents today with more maceration and increased measurements; waxing and waning measurements and moisture has been an ongoing issue. He continues to smoke a proximally 1 cigarette per day, but states he has not smoked since Sunday. We will initiate medihoney daily and evaluate next week. He presents today without compression stockings 09/05/17-He is here in follow-up evaluation for ulcerations to bilateral first and second and left third toe. He did not pick up the  medihoney and therefore has not been changing his dressing. He admits to inconsistent where of compression stockings, admits to pain to his bilateral lower extremities with unilateral edema (right greater than left). The ulcerations to multiple toes are more macerated and larger in size than last week. He is wearing compression therapy today. He admits to an overall feeling of weakness and fatigue and has been encouraged to contact his PCP for evaluation; he has a known history of anemia and intraabdominal lymphadenopathy. We will return to silvercel and he will follow up next week 09/19/17-He is here in follow-up evaluation for ulcerations to bilateral first and second toe.he was unable to make last week's appointment. The culture that was obtained on 5/30 grew clindamycin sensitive MRSA; he was initiated on 6/4 but did not start it until 6/7. There is significant improvement in all  wounds, healing to left second and third toe. He also admits to having no alcohol or smoking for the last 6 days. We will continue with same treatment plan I will extend the clindamycin for an additional 10 days and he will follow-up next week. 09/26/17- He is here in follow evaluation for multiple ulcerations to multiple toes bilaterally. He continues to be non-compliant with dressing, compression therapy. He states he is taking the antibiotics as prescribed, although his recall is inconsistent. There is noted deterioration in all ulcers. We will continue same treatment plan, antibiotic therapy and he will follow up next week. 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 This information is obtained from the medical record, as he cannot articulate specifics to his diagnosis or treatment plan. 10/17/17-He is here in follow-up evaluation for multiple ulcerations to multiple toes bilaterally. There is chronic waxing and waning improvement/deterioration. On today's exam they appear improved, dry without maceration. He continues to apply a variety of topical agents, stating that he has switched between the blue product and silver product. In my opinion, this will be a chronic waxing and waning giving his intermittent compliance. He will follow-up in 2 weeks. As it relates to the biopsy taken from his buttock, he has not followed up with that and has not started radiation therapy. 10/31/17-He is here for evaluation for multiple ulcerations to multiple toes bilaterally. Continues with waxing and waning improvement versus deterioration. They are stable on today's exam without significant maceration. 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 We will continue with every 2 week follow-up appointments 11/14/17- He  is seen in follow-up evaluation for multiple ulcerations to multiple bilateral toes. He is scheduled for excision of basal cell carcinoma to his right buttock on Monday 8/12. He continues to have chronic waxing and waning of his ulcers; today is a stable day with minimal drainage and left ear is better than right toes. I will recount to infectious disease regarding chronic suppressive therapy. He admits to being more compliant with compression stockings, although he is not wearing any today. He has been encouraged to continue with dressing changes as ordered and compression therapy daily. He will be seen in 2 weeks 11/28/17-He is seen in follow up evaluation for multiple ulcerations to multiple toes bilaterally. He had an overnight admission at [facility] (8/11-8/12) for symptomatic anemia, received two units prbc. His surgical date has been moved to 9/94 excision of basal cell carcinoma to  the right buttock. He has been noncompliant in wearing compression stockings, applying topical treatment as ordered; he has been applying a "cream", Hydrofera Blue, silvercel. He is currently on no antibiotic therapy; we will send for re-referral to ID for suppressive therapy given his multiple co-morbidities, poor surgical candidate, unwilling to have toe amputation and general non-adherence. 12/19/17 on evaluation today patient actually appears to be doing very well in regard to his toes at least compared to last time I saw him. He again has been coming to our office for quite a bit of time. 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 He states is hurting a little bit but doing well in general. I'm see were not taking care of this area dermatology is. He does have his appointment with infectious disease upcoming which they re-consult for suppressive therapy that's next Monday. 01/02/18 on evaluation  today patient actually appears to be doing rather well in regard to his toe ulcers although he tells me he has been put in an ointment on this which I am concerned may be causing some additional moisture buildup which we really do not want. He supposed be using silver cell which is mainly thing I want him to be utilizing. Fortunately there does not appear to be any evidence of worsening infection which is good news. He has had surgery on his gluteal region where he had an excision of a cancerous area fortunately that seems to be doing well although he is having some discomfort. 02/06/18 on evaluation today patient actually appears to be doing well at all locations except for his second toe use bilaterally Sinor, Dunbar E. ([id number]) where the distal tip is still wrong open. He did state that he can't stand for dry skin to buildup on his toes subsequently as a question and further it sounds as if he actually pills this all which is likely keeping the toe is wrong and open. Also think that's what happened in the bottom of his foot although that appears to have healed at this point. There does not appear to be any evidence of infection. 02/20/18 on evaluation today patient appears to be doing better in regard to his foot ulcers. Everything has shown signs of improvement which is excellent news. Overall I'm very pleased with how things appear currently. The patient states he's not picking her point at any skin and that he wears shoes at all times when he is ambulating at home. Both are things that I've advocated and recommended for him in the past. 03/13/18 on evaluation today patient actually appears to be doing very well in regard to the bilateral toes in the overall appearance of the wound bed locations. Fortunately there does not appear to be any signs of infection at this time. He states that he is very pleased with how things seem to be progressing to be honest as am I. 03/27/1899 evaluation today  patient appears to be doing well in regard to his toe ulcers. He just has a few areas remaining and these appear to be showing signs of improvement as well which is great news. Overall I have been impressed with the progress of the past several weeks. 04/10/18 on evaluation today patient appears to be doing a little bit worse in regard to the right plantar foot at the regional the first metatarsal as well as the left second toe. He tells me that  he ran out of bandages he also tells me he's been wearing his flip-flops a lot at home as well as a new pair of boots that he received as well. This is as opposed to the postop shoes that we had previously given him. Nonetheless overall I feel like that he has had a little bit more friction to the area which is causing things to look a little bit worse today. 04/24/18 on evaluation today patient's wounds for the most part appear to be doing excellent especially in regard to the bilateral second toes. With that being said he does have issues with the wound still on the first metatarsal region and first toe of the right foot which have not completely closed at this point. Nonetheless I do feel like that he is making progress which is good news. 05/08/18 on evaluation today patient's wounds appear to be doing just a little bit worse than normal simply due to the fact that he has not had dressing supplies. He apparently owed $35 in order for them to be shipped and unfortunately is not able to pay that currently. We did give him information for the [employer] today which will hopefully help cover his dressing supplies. He was thankful for this he states is gonna contact them this afternoon. Fortunately otherwise or does not hear any signs of infection. 05/22/18 on evaluation today patient appears to be doing somewhat poorly in regard to his toes. Upon further questioning it appears that he actually has been pulling skin off of the toes which "drive  him crazy". Subsequently as he does this he calls his new areas to tear open and I think this is one of the reasons why his toes are not healing as appropriately as we would like. He fortunately has no signs of infection at this time. With that being said I'm still concerned about the possibility of a fungal infection even though there doesn't appear to be anything bacterial. Potentially nystatin powder could be of benefit for him as far as trying to help this in that regard. 06/05/18 on evaluation today patient actually appears to be doing very well in regard to his foot ulcers. Fortunately there's no signs of infection. He's been tolerating the dressing changes without complication. I'm very pleased with how things seem to be progressing. Fortunately there's no evidence of active infection at this point I do believe nystatin powder has been of benefit for him. 06/19/18 on evaluation today patient actually appears to be doing a little worse compared to last evaluation. Tells me that he has not had any supplies for two weeks. With that being said his plantar foot wounds on the right actually appear to be doing very well. It's actually a second toes in the first toes that are the worst. I think these are areas the was able to get to and pick and he tells me in fact that he destine to pick out some of the dry skin because it causes him annoyance. Nonetheless I think this is somewhat detrimental to him I think it's worth keeping part of the wound open in particular. Fortunately there is no sign of infection at this time although I think that is a risk which I discussed specifically that he's not careful you can end up reopening this and causing greater problems for himself. 07/03/18 on evaluation today patient's wound bed actually shows evidence of good granulation at this time and epithelialization as far as the wounds in general are concerned. He  has been tolerating the dressing changes including the  nystatin without complication. This is good news. With that being said he seems to be still having some trouble with the left second toe distally at this point. 4/9; most of the wounds look quite satisfactory. He requires debridement of the plantar right great toe otherwise everything [name], [name] ([id number]) looks superficial to improved. His left second toe is healed. 08/14/18 on evaluation today patient actually appears to be doing very well in regard to his foot ulcerations. In fact he just has three small areas of the right foot still remaining open the left foot is closed and dry at this point. Overall I feel like he is doing quite well which is excellent news. Fortunately there's no signs of active infection. 08/28/18 on evaluation today patient's toes of the right foot actually shows signs of being a little bit worse in regard to the overall open areas at this point. He does tell me that he "washes them well" when he is taking a shower. Again I explained that I want to make sure he's not washing them to aggressively as this could potentially cause issues with overall worsening. 09/12/18 on evaluation today patient actually appears to be doing well in regard to his right foot ulcers. He's been tolerating the dressing changes without complication. Fortunately there's no signs of active infection at this time. With that being said he does have a new one on the dorsal surface of his foot just proximal to his toes he does not know really where this came from. 09/25/18 on evaluation today patient actually appears to be doing somewhat better in regard to his right foot ulcers although he has a new opening on the left second toe unfortunately. This is something that seems to be back and forth he tells me did Pickett scan at this area that is why it's now open yet again. No fevers, chills, nausea, or vomiting noted at this time. Electronic Signature(s) Signed: 09/26/2018 1:16:38 PM By: Worthy Keeler  PA-C Entered By: Worthy Keeler on 09/26/2018 08:35:59 Lordi, [name] ([id number]) -------------------------------------------------------------------------------- Physical Exam Details Patient Name: Flath, Aidenn E. Date of Service: 09/25/2018 9:00 AM Medical Record Number: [id number] Patient Account Number: [id number] Date of Birth/Sex: 02-06-1951 (68 y.o. M) Treating RN: Army Melia Primary Care Provider: Lamonte Sakai Other Clinician: Referring Provider: Lamonte Sakai Treating Provider/Extender: Melburn Hake, HOYT Weeks in Treatment: 42 Constitutional Well-nourished and well-hydrated in no acute distress. Respiratory normal breathing without difficulty. clear to auscultation bilaterally. Cardiovascular regular rate and rhythm with normal S1, S2. Psychiatric this patient is able to make decisions and demonstrates good insight into disease process. Alert and Oriented x 3. pleasant and cooperative. Notes Upon evaluation inspection today patient's wound bed actually showed signs of good granulation at this time. There does not appear to be any evidence of active infection currently. No fevers, chills, nausea, or vomiting noted at this time. Electronic Signature(s) Signed: 09/26/2018 1:16:38 PM By: Worthy Keeler PA-C Entered By: Worthy Keeler on 09/26/2018 08:36:29 [name], [name] ([id number]) -------------------------------------------------------------------------------- Physician Orders Details Patient Name: Kwasny, Sadie E. Date of Service: 09/25/2018 9:00 AM Medical Record Number: [id number] Patient Account Number: [id number] Date of Birth/Sex: 04-04-1951 (68 y.o. M) Treating RN: Army Melia Primary Care Provider: Lamonte Sakai Other Clinician: Referring Provider: Lamonte Sakai Treating Provider/Extender: Melburn Hake, HOYT Weeks in Treatment: 104 Verbal / Phone Orders: No Diagnosis Coding ICD-10 Coding Code Description L97.521 Non-pressure chronic ulcer of other part of left  foot limited to breakdown of skin L97.511 Non-pressure chronic ulcer of other part of right foot limited to breakdown of skin E11.621 Type 2 diabetes mellitus with foot ulcer I87.323 Chronic venous hypertension (idiopathic) with inflammation of bilateral lower extremity I89.0 Lymphedema, not elsewhere classified F17.218 Nicotine dependence, cigarettes, 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 abuse 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, right ankle and foot Wound Cleansing Wound #16 Right,Plantar Toe Great o Clean wound with Normal Saline. Wound #20 Left Toe Second o Clean wound with Normal Saline. Wound #18 Right,Dorsal Toe Great o Clean wound with Normal Saline. Wound #2 Right Toe Second o Clean wound with Normal Saline. Anesthetic (add to Medication List) Wound #16 Right,Plantar Toe Great o Topical Lidocaine 4% cream applied to wound bed prior to debridement (In Clinic Only). Wound #20 Left Toe Second o Topical Lidocaine 4% cream applied to wound bed prior to debridement (In Clinic Only). Wound #18 Right,Dorsal Toe Great o Topical Lidocaine 4% cream applied to wound bed prior to debridement (In Clinic Only). Wound #2 Right Toe Second o Topical Lidocaine 4% cream applied to wound bed prior to debridement (In Clinic Only). Skin Barriers/Peri-Wound Care Wound #16 Right,Plantar Toe Great o Antifungal powder-Nystatin Wound #20 Left Toe Second [name], [name] ([id number]) o Antifungal powder-Nystatin Wound #18 Right,Dorsal Toe Great o Antifungal powder-Nystatin Wound #2 Right Toe Second o Antifungal powder-Nystatin Primary Wound Dressing Wound #16 Right,Plantar Toe Great o Silver Alginate - secure with tape Wound #20 Left Toe Second o Silver Alginate - secure with tape Wound #18 Right,Dorsal Toe Great o Silver Alginate - secure with tape Wound #2 Right Toe Second o Silver Alginate - secure  with tape Dressing Change Frequency Wound #16 Right,Plantar Toe Great o Change dressing every other day. Wound #20 Left Toe Second o Change dressing every other day. Wound #18 Right,Dorsal Toe Great o Change dressing every other day. Wound #2 Right Toe Second o Change dressing every other day. Follow-up Appointments Wound #16 Right,Plantar Toe Great o Return Appointment in 2 weeks. Wound #20 Left Toe Second o Return Appointment in 2 weeks. Wound #18 Right,Dorsal Toe Great o Return Appointment in 2 weeks. Wound #2 Right Toe Second o Return Appointment in 2 weeks. Edema Control Wound #16 Right,Plantar Toe Great o Patient to wear own compression stockings Wound #20 Left Toe Second o Patient to wear own compression stockings Wound #18 Right,Dorsal Toe Great o Patient to wear own compression stockings [name], [name] ([id number]) Wound #2 Right Toe Second o Patient to wear own compression stockings Additional Orders / Instructions o Stop Smoking o Increase protein intake. Medications-please add to medication list. Wound #16 Right,Plantar Toe Great o Other: - Nystatin Powder Wound #20 Left Toe Second o Other: - Nystatin Powder Wound #18 Right,Dorsal Toe Great o Other: - Nystatin Powder Wound #2 Right Toe Second o Other: - Nystatin Powder Electronic Signature(s) Signed: 09/25/2018 3:38:22 PM By: Army Melia Signed: 09/26/2018 1:16:38 PM By: Worthy Keeler PA-C Entered By: Army Melia on 09/25/2018 10:02:11 [name], [name] ([id number]) -------------------------------------------------------------------------------- Problem List Details Patient Name: Wire, Fernado E. Date of Service: 09/25/2018 9:00 AM Medical Record Number: [id number] Patient Account Number: [id number] Date of Birth/Sex: 04-23-1950 (68 y.o. M) Treating RN: Army Melia Primary Care Provider: Lamonte Sakai Other Clinician: Referring Provider: Lamonte Sakai Treating  Provider/Extender: Melburn Hake, HOYT Weeks in Treatment: 104 Active Problems ICD-10 Evaluated Encounter Code Description Active Date Today Diagnosis L97.521 Non-pressure chronic ulcer of other part of left foot limited to  09/21/2016 No Yes breakdown of skin L97.511 Non-pressure chronic ulcer of other part of right foot limited to 09/21/2016 No Yes breakdown of skin E11.621 Type 2 diabetes mellitus with foot ulcer 09/21/2016 No Yes I87.323 Chronic venous hypertension (idiopathic) with inflammation of 09/21/2016 No Yes bilateral lower extremity I89.0 Lymphedema, not elsewhere classified 09/21/2016 No Yes F17.218 Nicotine dependence, cigarettes, with other nicotine-induced 09/21/2016 No Yes disorders F10.19 Alcohol abuse with unspecified alcohol-induced disorder 09/21/2016 No Yes M86.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, right ankle and foot 02/21/2017 No Yes Inactive Problems Resolved Problems Fuhriman, ALIXANDER RALLIS ([id number]) Electronic Signature(s) Signed: 09/26/2018 1:16:38 PM By: Worthy Keeler PA-C Entered By: Worthy Keeler on 09/25/2018 09:35:08 Gildersleeve, [name] ([id number]) -------------------------------------------------------------------------------- Progress Note Details Patient Name: Yonts, Antione E. Date of Service: 09/25/2018 9:00 AM Medical Record Number: [id number] Patient Account Number: [id number] Date of Birth/Sex: 07-18-1950 (68 y.o. M) Treating RN: Army Melia Primary Care Provider: Lamonte Sakai Other Clinician: Referring Provider: Lamonte Sakai Treating Provider/Extender: Melburn Hake, HOYT Weeks in Treatment: 104 Subjective Chief Complaint Information obtained from Patient Patient presents for treatment of an open diabetic ulcer to both feet History of Present Illness (HPI) 68 year old patient here to see as for bilateral feet ulceration to on his left first and second toe and 2 on his right first and second toe, which she's had for about 4 months. 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 Donella Stade. The patient is also undergoing workup by medical oncology for a intra-abdominal lymphadenopathy. 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 He is also status post appendectomy, inguinal lymph node biopsy, rectal biopsy and rectal examination under anesthesia. he currently smokes cigarettes about half packet a day. In March of this year he was seen by Dr. Hortencia Pilar, for evaluation of bilateral varicose veins and besides wearing compression stockings he had recommended laser ablation of the right and left great saphenous veins to eleviate the symptoms and complications of severe superficial venous reflux disease. He also recommended lymphedema pumps for better control of his lymphedema. The patient recently has had on 08/23/2016, right greater saphenous vein ablation with the laser energy Earlier lower extremity venous reflux examination done on 05/08/2016 showed no DVT or SVT both lower legs but incompetence of bilateral great saphenous veins was present. A lower arterial study was also done and there was no significant right lower and left lower extremity problems based on a normal toe brachial index bilaterally and the ABI was 1.21 the left and 1.23 on the right. His post ablation venous duplex examination showed successful ablation of the right GS vein with thrombus formation 2 below the right saphenofemoral junction. The deep system was patent without evidence of thrombosis and this was done on 08/30/2016. 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 Addendum: regarding his x-rays done today and x-ray of the  left foot -- IMPRESSION: No objective evidence of osteomyelitis. There are soft tissue changes which may reflect cellulitis. X-ray of the right foot -- IMPRESSION:Findings compatible with cellulitis of the toes. No objective evidence of osteomyelitis is observed. 10/01/16 on evaluation today patient's wounds appeared to be doing some better. I did review the x-rays as well  which showed no evidence of osteomyelitis although there was evidence on x-ray of cellulitis. He fortunately is not having any discomfort although he continues to have some swelling. He does not remember being on any antibiotics recently. 10/15/16 on evaluation today patient's wounds overall appear to be doing better although he does have a new location noted on the left foot. Fortunately he is not having significant pain. It almost has the appearance that something is rubbing on the end of his toes but he wears the open toe shoes and according to what he is telling me never wears anything that would rub on his foot. There is no evidence of infection and specifically no evidence of a fungal infection 10/22/16 On evaluation today patient's wounds appeared to be doing better compared to last week in regard to his bilateral lower extremities. Fortunately I happy with how things are progressing although he still has ulcers I feel like that he is improving and appropriate manner. AKSH, SWART ([id number]) 11/12/16 on evaluation today patient appears to be doing well in regard to his bilateral feet and the respective wounds. We have been using surrounding her dressings along with an antifungal cream which seems to be doing very well. He has no bilateral dysfunction noticed that the rituals are weight loss at this point. He also has no nausea or vomiting a note purulent discharge. He did see Vein and vascular today and he tells me that they told him he could have surgery for his venous stasis but they did not feel like it was worth it in  his words. Fortunately patient's wounds do appear to be getting sneakily better. 11/26/2016 -- he says he is going to have some surgery during this week at [facility] for possibly a colon resection. 12/31/2016 -- the patient has been noncompliant with his smoking and I'm not sure whether he is also started drinking again. He continues to be very nonchalant about his care 01/14/2017 -- the patient's HandP has been reviewed well and I understand he is being compliant with trying to give up smoking and his local dressing changes. He does not have any surgical options of 4 to him by his vascular surgeons.he was last seen in early August by Dr. Hortencia Pilar who recommended compression stockings,and possibly lymph pumps in 2-3 months after doing a review ultrasound. 01/28/2017 - the patient did not have any fresh complaints but on examination I noted a large lacerated wound on the plantar aspect of his right fourth toe which had a lot of necrotic debris and it probes down to bone. 02/07/2017 -- x-ray of the right foot -- IMPRESSION: Soft tissue swelling about the first through fourth toes consistent with cellulitis. New destructive change in the tuft of the distal phalanx of the great toe is consistent with osteomyelitis. 02/14/2017 -- the patient's MRI is pending this coming Monday and he still continues to smoke. We have again gone over off loading of his wounds in great detail and he says he's been compliant. 02/21/2017 -- MR of the right foot -- IMPRESSION: 1. Soft tissue ulcer at the tip of the first, second and third toe knows. Cortical irregularity and bone marrow edema in the first distal phalanx most concerning for osteomyelitis. Mild marrow edema in the second and third distal phalanx without definite cortical destruction which may reflect early osteomyelitis versus reactive marrow edema. 2. Soft tissue edema surrounding the first phalanx most consistent with cellulitis. the patient was  also recently evaluated by his medical  oncologist Dr. 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 She is treating him with weekly Procrit. She is also keeping intra-abdominal lymphadenopathy and right lower lobe lung nodule under observation. 04/04/2017 -- he was seen by Dr. Adrian Prows on 03/25/2017 -- after review he empirically put him on ciprofloxacin and doxycycline as they have good bone penetration and good bioavailability and it will cover the usual pathogens and diabetic foot osteomyelitis. He will check inflammatory markers and plan a 62-39 week old records. C-reactive protein was 0.3 and the ESR was 72 04/18/17 on evaluation today patient appears to be doing about the same in regard to his lower extremity wounds bilaterally. He has continued to use the antifungal cream which does seem to be beneficial. Nonetheless the ulcers do seem to in some areas be epithelial eyes over and in other areas are still open. He is having no significant discomfort. 04/25/17-he is here in follow-up evaluation for multiple ulcerations to multiple toes bilaterally. He states he did see Dr. Ola Spurr again last week and continues antibiotic therapy. He is voicing no complaints or concerns, will continue with current treatment plan will possibility of adding compression therapy next week after an additional week of treatment/lotions to BLE prescribed by Dr Ola Spurr 05/02/17 he is here in follow up for for multiple ulcers to multiple toes bilaterally. we will stop using antifungal cream and will continue with silvercel and follow up next week 05/09/17-he is here in follow-up for multiple ulcerations to multiple toes bilaterally. There is improvement in appearance. He has not completely stopped using antifungal cream, but admits he has not using it between the toes. He has an appointment with Dr. Ola Spurr on 2/11, continues on doxycycline  and Cipro. It has been 5 weeks of antibiotic therapy, we will order plain film xray to evaluate for osteomyelitis next week, prior to follow up with ID. Will continue with silvercel and follow up next week 05/16/17-he is here in follow-up evaluation for multiple ulcerations to multiple toes bilaterally and new wound to the right posterior heel. There is essentially no change in appearance, deteriorating measurements; he has a history of waxing and waning measurements. He admits that he continues to apply moisturizer/cream/ointment to his toes despite weekly reminders to only apply silvercel to his toes. He states that he thinks the surgical shoe contributed to the superficial ulcer to his posterior heel, he is unable to articulate if this was an area of dry cracked skin as he has a similar area to the left heel. He now is wearing open toed slippers. He has an appointment with Dr. Ola Spurr on 2/11. We have ordered x-rays for her bilateral feet; Schadt, Lory E. ([id number]) he was advised to obtain the x-rays today or tomorrow. He will follow-up next week 05/23/17-he is here in follow-up evaluation for multiple ulcerations to multiple toes bilaterally and the right posterior heel. There is improvement in maceration. He has been compliant and not applying any moisturizing agent to his toes. He has been using Lac-Hydrin for his lower extremities with improvement. He did not go to his appointment on Monday with Dr. Ola Spurr secondary to financial concerns. X-rays for her bilateral feet showed: LEFT FOOT with slight erosion of the tuft of the distal phalanges of the left first and second toe suspicious for osteomyelitis, RIGHT FOOT with 1.erosion of the tufts of the distal phalanges of the right first second and possibly third toes consistent with osteomyelitis, 2  no definitive abnormality of the calcaneus is seen on the images obtained, 3. Plantar calcaneal degenerative spur. We briefly discussed  hyperbaric adjunctive therapy for treatment of chronic refractory osteomyelitis. I do not find an a1c in EMR, will contact PCP for record, or order if needed. He has been encouraged to contact Dr Ola Spurr office regarding the follow-up appointment, encouraged him to inquire about payment plan. We will continue with same treatment plan and follow-up next week. He states he is still taking antibiotics and has "a lot" left. He states he has been taking them as directed, 2 pills twice daily. According to Dr. Blane Ohara office notes he was originally started on 12/17 for 4 weeks and extended on 1/14 for an additional 4 weeks. He should be done with his antibiotic therapy, he was advised to bring his bottles and to his next appointment, we will contact pharmacy. 05/30/17-he is here in follow-up evaluation for multiple ulcerations to multiple toes bilaterally and the right posterior heel. He is accompanied by his brother-in-law. Wounds are stable. He has yet to make up with Dr. Ola Spurr. We contacted his PCP, with no record of recent A1c we will draw an A1c. His brother-in-law states that he was taken off all of his diabetic medication secondary to kidney function. 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 The antibiotics were counted and have approximately 2 weeks left, although they should be complete. The pill organizer reveals missing days. We discussed the need for consistent medications, to have optimal benefit of medication. He has a cousin that lives with him and he will ask her to check his organizer daily. He has been advised to follow up with Dr Ola Spurr, and will go by the office today. He has been advised to quit smoking. 06/06/17-he is here in follow up evaluation. He has had to make an appointment with Dr. Ola Spurr. He did have blood work obtained, a1c 5. He continues to take  antibiotic therapy. Significant improvement in bilateral lower extremity edema with compression therapy. Essentially no change in ulcerations to toes. He states he is "going to try something different" and "let me know next week" if it works; he would not provide any additional information and was encouraged to follow our orders. We will follow up next week 06/13/17-he is here in follow-up evaluation. He has an appointment with Dr. Ola Spurr tomorrow morning. He states he purchased an ointment from [facility] and applied to his toes for 3 days, he does not remember the name of the ointment. There is improvement to his wounds, minimal maceration. He continues to take antibiotic therapy, this should have been completed last month. His brother-in-law who regularly accompanies his appointments was asked to take the bottles to the appointment tomorrow with Dr. Ola Spurr so he is aware. We will continue with 3 layer compression, and order OPEN TOE compression 20-30mmHg; we will apply compression stockings next week. He continues to smoke, smoked "2 cigarettes" last week 06/20/17 on evaluation today patient did receive his compression stockings which he has with him today for both lower extremities. With that being said he tells me at this point in time that he is very happy to have these he really is not a big fan of the compression wraps that we have been utilizing although they have been of great benefit for him. Nonetheless at this point he does want to switch to the compression stockings. In my opinion as long  as he is continuing with compression I'm okay with the stockings or the wraps. 06/27/17-he is here in follow-up evaluation for multiple ulcerations to his bilateral toes. There is some improvement in appearance. He is compliant in wearing his compression stockings with significant improvement in lower extremity edema. He saw Dr Ola Spurr on 3/8, per his notes they would redraw ESR and CRP; plan to  continue antibiotic therapy if these remain elevated. I do not see an ESR or CRP level in Epic. The patient continues to take antibiotics. 07/11/17-He is here in follow-up evaluation for multiple ulcerations to multiple toes bilaterally. He presents with complete epithelialization to the right third toe; there has been no deterioration. He continues on antibiotic therapy. He will follow-up next week 07/18/17-He is here in follow-up evaluation for multiple ulcerations to multiple toes bilaterally. He continues to make improvement. He continues on antibiotic therapy. He states he has been using something additional to our orders, he does not elaborate but states he will bring it in next week. 07/25/17-He is here in follow up evaluation for multiple ulcerations to bilateral toes. He is stable. He has completed antibiotic therapy. He admits to "filing" his toes after showers each evening, this is what he was referencing last week; he does not filing for the wounds. We will switch to [facility] and monitor for any improvement, he will follow-up next week 08/01/17-He is here in follow-up evaluation. He admits to "picking" at his toes after cleansing yesterday, leading to new areas of tissue loss on the bilateral second toe. There is improvement noted to the bilateral great toe. We will dress toes today and hope that they maintain until Monday where he will come in for a nurse visit. He has been advised, multiple times with expressed verbalization, to change the dressings to silvercel if the dressings get wet prior to [facility] appointment. 08/08/17-He is here in follow-up evaluation for bilateral first and second toe ulcerations. There is significant improvement to all ulcerations since last visit. We will switch to [facility] to all wounds and he will continue with nurse visits on a Pullman, Yee E. ([id number]) Monday/Thursday schedule and follow-up with me in 2 weeks. He continues to smoke, 1-3  cigarettes per day, and has been encouraged to not smoke until his next follow-up in 2 weeks. 08/15/17 on evaluation today patient's ulcers on his toes actually appear to be doing fairly well the right to ulcers may be a little bit more moist compared to the left I'm not really sure exactly why as the openings appear to be very small and I'm not seeing any evidence of anything significant as far as you lightest or otherwise. Nonetheless this may just be a small setback he's been doing very well with the [facility] Dressing 08/22/17-He is here in follow-up evaluation for ulcerations to his bilateral first and second toes, there is small/scant amount of drainage noted on today's dressing. He continues with Hydrofera Blue. He continues to smoke, 1 cigarette a day. Voices no complaint or concerns, compliant and compression therapy today. He'll follow-up next week 08/29/17-He is here in follow-up evaluation for ulcerations to his bilateral first and second toes, with a new wound to the left third toe. He states he cut himself while cutting the toenail. He presents today with more maceration and increased measurements; waxing and waning measurements and moisture has been an ongoing issue. He continues to smoke a proximally 1 cigarette per day, but states he has not smoked since Sunday. We will  initiate medihoney daily and evaluate next week. He presents today without compression stockings 09/05/17-He is here in follow-up evaluation for ulcerations to bilateral first and second and left third toe. He did not pick up the medihoney and therefore has not been changing his dressing. He admits to inconsistent where of compression stockings, admits to pain to his bilateral lower extremities with unilateral edema (right greater than left). The ulcerations to multiple toes are more macerated and larger in size than last week. He is wearing compression therapy today. He admits to an overall feeling of weakness and  fatigue and has been encouraged to contact his PCP for evaluation; he has a known history of anemia and intraabdominal lymphadenopathy. We will return to silvercel and he will follow up next week 09/19/17-He is here in follow-up evaluation for ulcerations to bilateral first and second toe.he was unable to make last week's appointment. The culture that was obtained on 5/30 grew clindamycin sensitive MRSA; he was initiated on 6/4 but did not start it until 6/7. There is significant improvement in all wounds, healing to left second and third toe. He also admits to having no alcohol or smoking for the last 6 days. We will continue with same treatment plan I will extend the clindamycin for an additional 10 days and he will follow-up next week. 09/26/17- He is here in follow evaluation for multiple ulcerations to multiple toes bilaterally. He continues to be non-compliant with dressing, compression therapy. He states he is taking the antibiotics as prescribed, although his recall is inconsistent. There is noted deterioration in all ulcers. We will continue same treatment plan, antibiotic therapy and he will follow up next week. 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 This information is obtained from the medical record, as he cannot articulate specifics to his diagnosis or treatment plan. 10/17/17-He is here in follow-up evaluation for multiple ulcerations to multiple toes bilaterally. There is chronic waxing and waning improvement/deterioration. On today's exam they appear improved, dry without maceration. He continues to apply a variety of topical agents, stating that he has switched between the blue product and silver product. In my opinion, this will be a chronic waxing and waning giving his intermittent compliance. He will  follow-up in 2 weeks. As it relates to the biopsy taken from his buttock, he has not followed up with that and has not started radiation therapy. 10/31/17-He is here for evaluation for multiple ulcerations to multiple toes bilaterally. Continues with waxing and waning improvement versus deterioration. They are stable on today's exam without significant maceration. 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 We will continue with every 2 week follow-up appointments 11/14/17- He is seen in follow-up evaluation for multiple ulcerations to multiple bilateral toes. He is scheduled for excision of basal cell carcinoma to his right buttock on Monday 8/12. He continues to have chronic waxing and waning of his ulcers; today is a stable day with minimal drainage and left ear is better than right toes. I will recount to infectious disease regarding chronic suppressive therapy. He admits to being more compliant with compression stockings, although he is not wearing any today. He has been encouraged to continue with dressing changes as ordered and compression therapy daily. He will be seen in 2 weeks 11/28/17-He is seen in follow up evaluation for  multiple ulcerations to multiple toes bilaterally. He had an overnight admission at [facility] (8/11-8/12) for symptomatic anemia, received two units prbc. His surgical date has been moved to 9/94 excision of basal cell carcinoma to the right buttock. He has been noncompliant in wearing compression stockings, applying topical treatment as ordered; he has been applying a "cream", Hydrofera Blue, silvercel. He is currently on no antibiotic therapy; we will send for re-referral to ID for suppressive therapy given his multiple co-morbidities, poor surgical candidate, unwilling to have toe amputation and general non-adherence. 12/19/17 on evaluation today patient actually appears to be doing very well in regard to his toes at least compared to  last time I saw him. He again has been coming to our office for quite a bit of time. 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 He states is hurting a little bit but doing well in general. I'm see were not taking care of this area dermatology is. He does have his appointment with infectious disease upcoming which they re-consult for suppressive therapy that's next Monday. 01/02/18 on evaluation today patient actually appears to be doing rather well in regard to his toe ulcers although he tells me he Mathews, Maurice E. ([id number]) has been put in an ointment on this which I am concerned may be causing some additional moisture buildup which we really do not want. He supposed be using silver cell which is mainly thing I want him to be utilizing. Fortunately there does not appear to be any evidence of worsening infection which is good news. He has had surgery on his gluteal region where he had an excision of a cancerous area fortunately that seems to be doing well although he is having some discomfort. 02/06/18 on evaluation today patient actually appears to be doing well at all locations except for his second toe use bilaterally where the distal tip is still wrong open. He did state that he can't stand for dry skin to buildup on his toes subsequently as a question and further it sounds as if he actually pills this all which is likely keeping the toe is wrong and open. Also think that's what happened in the bottom of his foot although that appears to have healed at this point. There does not appear to be any evidence of infection. 02/20/18 on evaluation today patient appears to be doing better in regard to his foot ulcers. Everything has shown signs of improvement which is excellent news. Overall I'm very pleased with how things appear currently. The patient states he's not picking her point at any  skin and that he wears shoes at all times when he is ambulating at home. Both are things that I've advocated and recommended for him in the past. 03/13/18 on evaluation today patient actually appears to be doing very well in regard to the bilateral toes in the overall appearance of the wound bed locations. Fortunately there does not appear to be any signs of infection at this time. He states that he is very pleased with how things seem to be progressing to be honest as am I. 03/27/1899 evaluation today patient appears to be doing well in regard to his toe ulcers. He just has a few areas remaining and these appear to be showing signs of improvement as well which is great news. Overall I have been impressed with the progress of the past several weeks. 04/10/18  on evaluation today patient appears to be doing a little bit worse in regard to the right plantar foot at the regional the first metatarsal as well as the left second toe. He tells me that he ran out of bandages he also tells me he's been wearing his flip-flops a lot at home as well as a new pair of boots that he received as well. This is as opposed to the postop shoes that we had previously given him. Nonetheless overall I feel like that he has had a little bit more friction to the area which is causing things to look a little bit worse today. 04/24/18 on evaluation today patient's wounds for the most part appear to be doing excellent especially in regard to the bilateral second toes. With that being said he does have issues with the wound still on the first metatarsal region and first toe of the right foot which have not completely closed at this point. Nonetheless I do feel like that he is making progress which is good news. 05/08/18 on evaluation today patient's wounds appear to be doing just a little bit worse than normal simply due to the fact that he has not had dressing supplies. He apparently owed $35 in order for them to be shipped and  unfortunately is not able to pay that currently. We did give him information for the [facility] today which will hopefully help cover his dressing supplies. He was thankful for this he states is gonna contact them this afternoon. Fortunately otherwise or does not hear any signs of infection. 05/22/18 on evaluation today patient appears to be doing somewhat poorly in regard to his toes. Upon further questioning it appears that he actually has been pulling skin off of the toes which "drive him crazy". Subsequently as he does this he calls his new areas to tear open and I think this is one of the reasons why his toes are not healing as appropriately as we would like. He fortunately has no signs of infection at this time. With that being said I'm still concerned about the possibility of a fungal infection even though there doesn't appear to be anything bacterial. Potentially nystatin powder could be of benefit for him as far as trying to help this in that regard. 06/05/18 on evaluation today patient actually appears to be doing very well in regard to his foot ulcers. Fortunately there's no signs of infection. He's been tolerating the dressing changes without complication. I'm very pleased with how things seem to be progressing. Fortunately there's no evidence of active infection at this point I do believe nystatin powder has been of benefit for him. 06/19/18 on evaluation today patient actually appears to be doing a little worse compared to last evaluation. Tells me that he has not had any supplies for two weeks. With that being said his plantar foot wounds on the right actually appear to be doing very well. It's actually a second toes in the first toes that are the worst. I think these are areas the was able to get to and pick and he tells me in fact that he destine to pick out some of the dry skin because it causes him annoyance. Nonetheless I think this is somewhat detrimental to him I  think it's worth keeping part of the wound open in particular. Fortunately there is no sign of infection at this time although I think that is a risk which I discussed specifically that he's not careful you can end  up reopening this and causing greater problems for himself. JEMAINE, PROKOP ([id number]) 07/03/18 on evaluation today patient's wound bed actually shows evidence of good granulation at this time and epithelialization as far as the wounds in general are concerned. He has been tolerating the dressing changes including the nystatin without complication. This is good news. With that being said he seems to be still having some trouble with the left second toe distally at this point. 4/9; most of the wounds look quite satisfactory. He requires debridement of the plantar right great toe otherwise everything looks superficial to improved. His left second toe is healed. 08/14/18 on evaluation today patient actually appears to be doing very well in regard to his foot ulcerations. In fact he just has three small areas of the right foot still remaining open the left foot is closed and dry at this point. Overall I feel like he is doing quite well which is excellent news. Fortunately there's no signs of active infection. 08/28/18 on evaluation today patient's toes of the right foot actually shows signs of being a little bit worse in regard to the overall open areas at this point. He does tell me that he "washes them well" when he is taking a shower. Again I explained that I want to make sure he's not washing them to aggressively as this could potentially cause issues with overall worsening. 09/12/18 on evaluation today patient actually appears to be doing well in regard to his right foot ulcers. He's been tolerating the dressing changes without complication. Fortunately there's no signs of active infection at this time. With that being said he does have a new one on the dorsal surface of his foot just  proximal to his toes he does not know really where this came from. 09/25/18 on evaluation today patient actually appears to be doing somewhat better in regard to his right foot ulcers although he has a new opening on the left second toe unfortunately. This is something that seems to be back and forth he tells me did Pickett scan at this area that is why it's now open yet again. No fevers, chills, nausea, or vomiting noted at this time. Patient History Information obtained from Patient. Social History Current every day smoker, Marital Status - Widowed, Alcohol Use - Daily - quit drinking about a week ago, Drug Use - No History, Caffeine Use - Moderate. Medical History Eyes Denies history of Cataracts, Glaucoma, Optic Neuritis Ear/Nose/Mouth/Throat Denies history of Chronic sinus problems/congestion, Middle ear problems Hematologic/Lymphatic Patient has history of Anemia, Lymphedema Denies history of Hemophilia, Human Immunodeficiency Virus, Sickle Cell Disease Respiratory Denies history of Aspiration, Asthma, Chronic Obstructive Pulmonary Disease (COPD), Pneumothorax, Sleep Apnea, Tuberculosis Cardiovascular Patient has history of Congestive Heart Failure, Hypertension, Peripheral Venous Disease Denies history of Angina, Arrhythmia, Coronary Artery Disease, Deep Vein Thrombosis, Hypotension, Myocardial Infarction, Peripheral Arterial Disease, Phlebitis, Vasculitis Gastrointestinal Denies history of Cirrhosis , Colitis, Crohn s, Hepatitis A, Hepatitis B, Hepatitis C Endocrine Patient has history of Type II Diabetes Genitourinary Denies history of End Stage Renal Disease Immunological Denies history of Lupus Erythematosus, Raynaud s, Scleroderma Integumentary (Skin) Denies history of History of Burn, History of pressure wounds Musculoskeletal Denies history of Gout, Rheumatoid Arthritis, Osteoarthritis, Osteomyelitis Neurologic Wachs, Eduin E. ([id number]) Patient has history of  Neuropathy Denies history of Dementia, Quadriplegia, Paraplegia, Seizure Disorder Medical And Surgical History Notes Oncologic 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 Review of Systems (ROS) Constitutional Symptoms (General Health) Denies complaints or symptoms  of Fatigue, Fever, Chills, Marked Weight Change. Respiratory Denies complaints or symptoms of Chronic or frequent coughs, Shortness of Breath. Cardiovascular Denies complaints or symptoms of Chest pain, LE edema. Psychiatric Denies complaints or symptoms of Anxiety, Claustrophobia. Objective Constitutional Well-nourished and well-hydrated in no acute distress. Vitals Time Taken: 9:23 AM, Height: 69 in, Weight: 168 lbs, BMI: 24.8, Temperature: 98.2 F, Pulse: 78 bpm, Respiratory Rate: 18 breaths/min, Blood Pressure: 143/68 mmHg. Respiratory normal breathing without difficulty. clear to auscultation bilaterally. Cardiovascular regular rate and rhythm with normal S1, S2. Psychiatric this patient is able to make decisions and demonstrates good insight into disease process. Alert and Oriented x 3. pleasant and cooperative. General Notes: Upon evaluation inspection today patient's wound bed actually showed signs of good granulation at this time. There does not appear to be any evidence of active infection currently. No fevers, chills, nausea, or vomiting noted at this time. Integumentary (Hair, Skin) Wound #16 status is Open. Original cause of wound was Not Known. The wound is located on the [employer]. The wound measures 1.5cm length x 1.8cm width x 0.1cm depth; 2.121cm^2 area and 0.212cm^3 volume. Wound #18 status is Open. Original cause of wound was Gradually Appeared. The wound is located on the Right,[employer] The wound measures 0.1cm length x 0.1cm width x 0.1cm depth; 0.008cm^2 area and 0.001cm^3 volume. Wound #19 status is Healed - Epithelialized. Original cause of wound was  Gradually Appeared. The wound is located on the Right,Dorsal Foot. The wound measures 0cm length x 0cm width x 0cm depth; 0cm^2 area and 0cm^3 volume. [name], [name] ([id number]) Wound #2 status is Open. Original cause of wound was Gradually Appeared. The wound is located on the Right Toe Second. The wound measures 0.2cm length x 0.3cm width x 0.1cm depth; 0.047cm^2 area and 0.005cm^3 volume. Wound #20 status is Open. Original cause of wound was Pressure Injury. The wound is located on the Left Toe Second. The wound measures 2.5cm length x 2cm width x 0.1cm depth; 3.927cm^2 area and 0.393cm^3 volume. 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 Wound #20 Left Toe Second: Clean wound with Normal Saline. Wound #18 Right,Dorsal Toe Great: Clean wound with Normal Saline. Wound #2 Right Toe Second: Clean wound with Normal Saline. Anesthetic (add to Medication List): Wound #16 Right,Plantar Toe Great: Topical Lidocaine 4% cream applied to wound bed prior to debridement (In Clinic Only). Wound #20 Left Toe Second: Topical Lidocaine 4% cream applied to wound bed prior to debridement (In Clinic Only). Wound #18 Right,Dorsal Toe Great: Topical Lidocaine 4% cream applied to wound bed prior to debridement (In Clinic Only). Wound #2 Right Toe Second: Topical Lidocaine 4% cream applied to wound bed prior to  debridement (In Clinic Only). Skin Barriers/Peri-Wound Care: Wound #16 Right,Plantar Toe Great: Antifungal powder-Nystatin Wound #20 Left Toe Second: Antifungal powder-Nystatin Wound #18 Right,Dorsal Toe Great: Antifungal powder-Nystatin Wound #2 Right Toe Second: Antifungal powder-Nystatin Primary Wound Dressing: Tomerlin, Selden E. ([id number]) Wound #16 Right,Plantar Toe Great: Silver Alginate - secure with tape Wound #20 Left Toe  Second: Silver Alginate - secure with tape Wound #18 Right,Dorsal Toe Great: Silver Alginate - secure with tape Wound #2 Right Toe Second: Silver Alginate - secure with tape Dressing Change Frequency: Wound #16 Right,Plantar Toe Great: Change dressing every other day. Wound #20 Left Toe Second: Change dressing every other day. Wound #18 Right,Dorsal Toe Great: Change dressing every other day. Wound #2 Right Toe Second: Change dressing every other day. Follow-up Appointments: Wound #16 Right,Plantar Toe Great: Return Appointment in 2 weeks. Wound #20 Left Toe Second: Return Appointment in 2 weeks. Wound #18 Right,Dorsal Toe Great: Return Appointment in 2 weeks. Wound #2 Right Toe Second: Return Appointment in 2 weeks. Edema Control: Wound #16 Right,Plantar Toe Great: Patient to wear own compression stockings Wound #20 Left Toe Second: Patient to wear own compression stockings Wound #18 Right,Dorsal Toe Great: Patient to wear own compression stockings Wound #2 Right Toe Second: Patient to wear own compression stockings Additional Orders / Instructions: Stop Smoking Increase protein intake. Medications-please add to medication list.: Wound #16 Right,Plantar Toe Great: Other: - Nystatin Powder Wound #20 Left Toe Second: Other: - Nystatin Powder Wound #18 Right,Dorsal Toe Great: Other: - Nystatin Powder Wound #2 Right Toe Second: Other: - Nystatin Powder My suggestion at this point is gonna be that we go ahead and continue with the above  wound care measures for the next week and the patient is in agreement with that plan. Subsequently we will see were things stand at follow-up. If anything changes or worsens in the meantime he will contact the office let me know. Otherwise we're gonna see him in two weeks for reevaluation. Please see above for specific wound care orders. We will see patient for re-evaluation in 2 week(s) here in the clinic. If anything worsens or changes patient will contact our office for additional recommendations. HAIG, GERARDO ([id number]) Electronic Signature(s) Signed: 09/26/2018 1:16:38 PM By: Worthy Keeler PA-C Entered By: Worthy Keeler on 09/26/2018 08:37:30 [name], [name] ([id number]) -------------------------------------------------------------------------------- ROS/PFSH Details Patient Name: Cosio, Atlas E. Date of Service: 09/25/2018 9:00 AM Medical Record Number: [id number] Patient Account Number: [id number] Date of Birth/Sex: Mar 12, 1951 (68 y.o. M) Treating RN: Army Melia Primary Care Provider: Lamonte Sakai Other Clinician: Referring Provider: Lamonte Sakai Treating Provider/Extender: Melburn Hake, HOYT Weeks in Treatment: 104 Information Obtained From Patient Constitutional Symptoms (General Health) Complaints and Symptoms: Negative for: Fatigue; Fever; Chills; Marked Weight Change Respiratory Complaints and Symptoms: Negative for: Chronic or frequent coughs; Shortness of Breath Medical History: Negative for: Aspiration; Asthma; Chronic Obstructive Pulmonary Disease (COPD); Pneumothorax; Sleep Apnea; Tuberculosis Cardiovascular Complaints and Symptoms: Negative for: Chest pain; LE edema Medical History: Positive for: Congestive Heart Failure; Hypertension; Peripheral Venous Disease Negative for: Angina; Arrhythmia; Coronary Artery Disease; Deep Vein Thrombosis; Hypotension; Myocardial Infarction; Peripheral Arterial Disease; Phlebitis; Vasculitis Psychiatric Complaints and  Symptoms: Negative for: Anxiety; Claustrophobia Eyes Medical History: Negative for: Cataracts; Glaucoma; Optic Neuritis Ear/Nose/Mouth/Throat Medical History: Negative for: Chronic sinus problems/congestion; Middle ear problems Hematologic/Lymphatic Medical History: Positive for: Anemia; Lymphedema Negative for: Hemophilia; Human Immunodeficiency Virus; Sickle Cell Disease Gastrointestinal Mettler, Akiel E. ([id number]) Medical History: Negative for: Cirrhosis ; Colitis; Crohnos; Hepatitis A; Hepatitis B; Hepatitis C Endocrine Medical History: Positive for: Type II Diabetes Treated with: Oral agents Blood sugar tested every day: Yes Tested : QD Genitourinary Medical History: Negative for: End Stage Renal Disease Immunological Medical History: Negative for: Lupus Erythematosus; Raynaudos; Scleroderma Integumentary (Skin) Medical History: Negative for: History of Burn; History of pressure wounds Musculoskeletal Medical History: Negative for:  Gout; Rheumatoid Arthritis; Osteoarthritis; Osteomyelitis Neurologic Medical History: Positive for: Neuropathy Negative for: Dementia; Quadriplegia; Paraplegia; Seizure Disorder Oncologic Medical History: Past Medical History Notes: 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 ARLEIGH, ODOWD ([id number]) Electronic Signature(s) Signed: 09/26/2018 1:16:38 PM By: Worthy Keeler PA-C Signed: 09/26/2018 4:22:16  PM By: Army Melia Entered By: Worthy Keeler on 09/26/2018 08:36:19 [place], [name] ([id number]) -------------------------------------------------------------------------------- SuperBill Details Patient Name: [place], Seward E. Date of Service: 09/25/2018 Medical Record Number: [id number] Patient Account Number: [id number] Date of Birth/Sex: 07/27/1950 (68 y.o. 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 I89.0 Lymphedema, not elsewhere classified F17.218 Nicotine dependence, cigarettes, 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 abuse 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, right ankle and foot Facility Procedures CPT4 Code: [id number] Description: 99214 - WOUND CARE VISIT-LEV 4 EST PT Modifier: Quantity: 1 Physician Procedures CPT4 Code Description: [id number] [id number] - WC PHYS LEVEL 4 - EST PT ICD-10 Diagnosis Description L97.521 Non-pressure chronic ulcer of other part of left foot limited to L97.511 Non-pressure chronic ulcer of other part of right foot limited t E11.621 Type 2  diabetes mellitus with foot ulcer I87.323 Chronic venous hypertension (idiopathic) with inflammation of bi Modifier: breakdown of s o breakdown of lateral lower e Quantity: 1 kin skin xtremity Electronic Signature(s) Signed: 09/26/2018 1:16:38 PM By: Worthy Keeler PA-C Entered By: Worthy Keeler on 09/25/2018 23:49:55

## 2018-09-26 NOTE — Progress Notes (Signed)
MARCELLIS, FRAMPTON ([id number]) Visit Report for 09/25/2018 Arrival Information Details Patient Name: [name], [name] Date of Service: 09/25/2018 9:00 AM Medical Record Number: [id number] Patient Account Number: [id number] Date of Birth/Sex: Apr 23, 1950 (68 y.o. M) Treating RN: [name] Primary Care [name]: [name] Other Clinician: Referring [name]: [name] Treating [name] Oldenburg/Extender: [name] Weeks in Treatment: 104 Visit Information History Since Last Visit Added or deleted any medications: No Patient Arrived: Ambulatory Any new allergies or adverse reactions: No Arrival Time: 09:21 Had a fall or experienced change in No Accompanied By: self activities of daily living that may affect Transfer Assistance: None risk of falls: Patient Identification Verified: Yes Signs or symptoms of abuse/neglect since last visito No Secondary Verification Process Completed: Yes Hospitalized since last visit: No Patient Requires Transmission-Based No Implantable device outside of the clinic excluding No Precautions: cellular tissue based products placed in the center Patient Has Alerts: Yes since last visit: Patient Alerts: DMII Pain Present Now: No Electronic Signature(s) Signed: 09/25/2018 4:58:07 PM By: [name], BSN, RN, CWS, Kim RN, BSN Entered By: [name], BSN, RN, CWS, [name] on 09/25/2018 09:22:59 [name], [name] ([id number]) -------------------------------------------------------------------------------- Clinic Level of Care Assessment Details Patient Name: Agustin, Saylor E. Date of Service: 09/25/2018 9:00 AM Medical Record Number: [id number] Patient Account Number: [id number] Date of Birth/Sex: 12-02-1950 (68 y.o. M) Treating RN: [name] Primary Care Tildon Silveria: [name] Other Clinician: Referring [name]: [name] Treating [name]/Extender: [name] Weeks in Treatment: 104 Clinic Level of Care Assessment Items TOOL 4 Quantity Score []  - Use when only  an EandM is performed on FOLLOW-UP visit 0 ASSESSMENTS - Nursing Assessment / Reassessment X - Reassessment of Co-morbidities (includes updates in patient status) 1 10 X- 1 5 Reassessment of Adherence to Treatment Plan ASSESSMENTS - Wound and Skin Assessment / Reassessment []  - Simple Wound Assessment / Reassessment - one wound 0 X- 4 5 Complex Wound Assessment / Reassessment - multiple wounds []  - 0 Dermatologic / Skin Assessment (not related to wound area) ASSESSMENTS - Focused Assessment []  - Circumferential Edema Measurements - multi extremities 0 []  - 0 Nutritional Assessment / Counseling / Intervention []  - 0 Lower Extremity Assessment (monofilament, tuning fork, pulses) []  - 0 Peripheral Arterial Disease Assessment (using hand held doppler) ASSESSMENTS - Ostomy and/or Continence Assessment and Care []  - Incontinence Assessment and Management 0 []  - 0 Ostomy Care Assessment and Management (repouching, etc.) PROCESS - Coordination of Care X - Simple Patient / Family Education for ongoing care 1 15 []  - 0 Complex (extensive) Patient / Family Education for ongoing care []  - 0 Staff obtains Programmer, systems, Records, Test Results / Process Orders []  - 0 Staff telephones HHA, Nursing Homes / Clarify orders / etc []  - 0 Routine Transfer to another Facility (non-emergent condition) []  - 0 Routine Hospital Admission (non-emergent condition) []  - 0 New Admissions / Biomedical engineer / Ordering NPWT, Apligraf, etc. []  - 0 Emergency Hospital Admission (emergent condition) X- 1 10 Simple Discharge Coordination [name], [name] ([id number]) []  - 0 Complex (extensive) Discharge Coordination PROCESS - Special Needs []  - Pediatric / Minor Patient Management 0 []  - 0 Isolation Patient Management []  - 0 Hearing / Language / Visual special needs []  - 0 Assessment of Community assistance (transportation, D/C planning, etc.) []  - 0 Additional assistance / Altered mentation []  -  0 Support Surface(s) Assessment (bed, cushion, seat, etc.) INTERVENTIONS - Wound Cleansing / Measurement []  - Simple Wound Cleansing - one wound 0 X- 4 5  Complex Wound Cleansing - multiple wounds X- 1 5 Wound Imaging (photographs - any number of wounds) []  - 0 Wound Tracing (instead of photographs) []  - 0 Simple Wound Measurement - one wound X- 4 5 Complex Wound Measurement - multiple wounds INTERVENTIONS - Wound Dressings X - Small Wound Dressing one or multiple wounds 4 10 []  - 0 Medium Wound Dressing one or multiple wounds []  - 0 Large Wound Dressing one or multiple wounds []  - 0 Application of Medications - topical []  - 0 Application of Medications - injection INTERVENTIONS - Miscellaneous []  - External ear exam 0 []  - 0 Specimen Collection (cultures, biopsies, blood, body fluids, etc.) []  - 0 Specimen(s) / Culture(s) sent or taken to Lab for analysis []  - 0 Patient Transfer (multiple staff / Civil Service fast streamer / Similar devices) []  - 0 Simple Staple / Suture removal (25 or less) []  - 0 Complex Staple / Suture removal (26 or more) []  - 0 Hypo / Hyperglycemic Management (close monitor of Blood Glucose) []  - 0 Ankle / Brachial Index (ABI) - do not check if billed separately X- 1 5 Vital Signs [name], [name] ([id number]) Has the patient been seen at the hospital within the last three years: Yes Total Score: 150 Level Of Care: New/Established - Level 4 Electronic Signature(s) Signed: 09/25/2018 3:38:22 PM By: [name] Entered By: [name] on 09/25/2018 10:04:21 [name], [name] ([id number]) -------------------------------------------------------------------------------- Encounter Discharge Information Details Patient Name: Hedlund, Madison E. Date of Service: 09/25/2018 9:00 AM Medical Record Number: [id number] Patient Account Number: [id number] Date of Birth/Sex: 07-30-1950 (68 y.o. M) Treating RN: [name] Primary Care Jadarion Halbig: [name] Other  Clinician: Referring Lashae Wollenberg: [name] Treating Kalief Kattner/Extender: [name] Weeks in Treatment: 104 Encounter Discharge Information Items Discharge Condition: Stable Ambulatory Status: Ambulatory Discharge Destination: Home Transportation: Private Auto Accompanied By: self Schedule Follow-up Appointment: Yes Clinical Summary of Care: Electronic Signature(s) Signed: 09/25/2018 4:58:07 PM By: [name], BSN, RN, CWS, Kim RN, BSN Entered By: [name], BSN, RN, CWS, [name] on 09/25/2018 10:13:19 Mcniel, [name] ([id number]) -------------------------------------------------------------------------------- Lower Extremity Assessment Details Patient Name: Haynesworth, Tylon E. Date of Service: 09/25/2018 9:00 AM Medical Record Number: [id number] Patient Account Number: [id number] Date of Birth/Sex: [birth year] (68 y.o. M) Treating RN: [name] Primary Care Angelita Harnack: [name] Other Clinician: Referring Marynell Bies: [name] Treating Aking Klabunde/Extender: [name] Weeks in Treatment: 104 Edema Assessment Assessed: [Left: No] [Right: No] Edema: [Left: No] [Right: No] Vascular Assessment Pulses: Dorsalis Pedis Palpable: [Left:Yes] [Right:Yes] Electronic Signature(s) Signed: 09/25/2018 4:58:07 PM By: [name], BSN, RN, CWS, Kim RN, BSN Entered By: [name], BSN, RN, CWS, [name] on 09/25/2018 09:32:12 [name], [name] ([id number]) -------------------------------------------------------------------------------- Multi Wound Chart Details Patient Name: Smigelski, Avyukth E. Date of Service: 09/25/2018 9:00 AM Medical Record Number: [id number] Patient Account Number: [id number] Date of Birth/Sex: 10-18-1950 (68 y.o. 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 Lower Extremity Date Acquired: 10/29/2017 05/22/2018 09/05/2018 Weeks of Treatment: 47 18 1 Wound Status: Open Open Open Pending Amputation on No No  No Presentation: Measurements L x W x D 1.5x1.8x0.1 0.1x0.1x0.1 0.1x0.1x0.1 (cm) Area (cm) : 2.121 0.008 0.008 Volume (cm) : 0.212 0.001 0.001 % Reduction in Area: -32.40% 99.00% 99.30% % Reduction in Volume: -32.50% 98.80% 99.20% Classification: Grade 1 Grade 1 Grade 2 Wound Number: 2 20 N/A Photos: No Photos N/A Wound Location: Right Toe Second Left Toe Second N/A Wounding Event: Gradually Appeared Pressure Injury N/A Primary Etiology: N/A Vantassell, Ermal E. 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 Date Acquired: 06/11/2016 09/22/2018 N/A Weeks of Treatment: 104 0 N/A Wound Status: Open Open N/A Pending Amputation on Yes No N/A Presentation: Measurements L x W x D 0.2x0.3x0.1 2.5x2x0.1 N/A (cm) Area (cm) : 0.047 3.927 N/A Volume (cm) : 0.005 0.393 N/A % Reduction in Area: 98.50% N/A N/A % Reduction in Volume: 98.40% N/A N/A Classification: Grade 2 N/A N/A Treatment Notes Electronic Signature(s) Signed: 09/25/2018 3:38:22 PM By: [name] Entered By: [name] on 09/25/2018 09:57:21 [name], [name] ([id number]) -------------------------------------------------------------------------------- [place] Details Patient Name: Ehresman, Sheryl E. Date of Service: 09/25/2018 9:00 AM Medical Record Number: [id number] Patient Account Number: [id number] Date of  Birth/Sex: 05-Mar-1951 (68 y.o. M) Treating RN: [name] Primary Care Nabiha Planck: [name] Other Clinician: Referring Frannie Shedrick: [name] Treating Tramane Gorum/Extender: [name] Weeks in Treatment: 74 Active Inactive Abuse / Safety / Falls / Self Care Management Nursing Diagnoses: Potential for falls Goals: Patient will remain injury free related to falls Date Initiated: 09/21/2016 Target Resolution Date: 09/14/2017 Goal Status: Active Interventions: Assess fall risk on admission and as needed Notes: Nutrition Nursing Diagnoses: Potential for alteratiion in Nutrition/Potential for imbalanced nutrition Goals: Patient/caregiver agrees to and verbalizes understanding of need to use nutritional supplements and/or vitamins as prescribed Date Initiated: 09/21/2016 Target Resolution Date: 09/14/2017 Goal Status: Active Interventions: Assess patient nutrition upon admission and as needed per policy Notes: Orientation to the Wound Care Program Nursing Diagnoses: Knowledge deficit related to the wound healing center program Goals: Patient/caregiver will verbalize understanding of the [place] Program Date Initiated: 09/21/2016 Target Resolution Date: 06/15/2017 Goal Status: Active Interventions: Provide education on orientation to the wound center Wessinger, Wardell E. ([id number]) Notes: Wound/Skin Impairment Nursing Diagnoses: Knowledge deficit related to smoking impact on wound healing Goals: Ulcer/skin breakdown will have a volume reduction of 30% by week 4 Date Initiated: 09/21/2016 Target Resolution Date: 08/17/2017 Goal Status: Active Ulcer/skin breakdown will have a volume reduction of 50% by week 8 Date Initiated: 09/21/2016 Target Resolution Date: 08/17/2017 Goal Status: Active Ulcer/skin breakdown will have a volume reduction of 80% by week 12 Date Initiated: 09/21/2016 Target Resolution Date: 09/14/2017 Goal Status: Active Ulcer/skin breakdown will heal  within 14 weeks Date Initiated: 09/21/2016 Target Resolution Date: 08/17/2017 Goal Status: Active Interventions: Assess patient/caregiver ability to obtain necessary supplies Assess patient/caregiver ability to perform ulcer/skin care regimen upon admission and as needed Assess ulceration(s) every visit Notes: Electronic Signature(s) Signed: 09/25/2018 3:38:22 PM By: [name] Entered By: [name] on 09/25/2018 09:57:09 [name], [name] ([id number]) -------------------------------------------------------------------------------- Pain Assessment Details Patient Name: Cadmus, Donnivan E. Date of Service: 09/25/2018 9:00 AM Medical Record Number: [id number] Patient Account Number: [id number] Date of Birth/Sex: [birth year] (68 y.o. M) Treating RN: [name] Primary Care Lauryl Seyer: [name] Other Clinician: Referring Rmoni Keplinger: [name] Treating Deeann Servidio/Extender: [name] Weeks in Treatment: 104 Active Problems Location of Pain Severity and Description of Pain Patient Has Paino No Site Locations With Dressing Change: No Pain Management and Medication  Current Pain Management: Electronic Signature(s) Signed: 09/25/2018 4:58:07 PM By: [name], BSN, RN, CWS, Kim RN, BSN Entered By: [name], BSN, RN, CWS, [name] on 09/25/2018 09:23:07 [name], [name] ([id number]) -------------------------------------------------------------------------------- Patient/Caregiver Education Details Patient Name: [name], [name] Date of Service: 09/25/2018 9:00 AM Medical Record Number: [id number] Patient Account Number: [id number] Date of Birth/Gender: 1950/08/02 (68 y.o. M) Treating RN: [name] Primary Care Physician: [name] Other Clinician: Referring Physician: Lamonte [name] Treating Physician/Extender: Sharalyn Ink in Treatment: 104 Education Assessment Education Provided To: Patient Education Topics Provided Smoking and Wound Healing: Handouts: Smoking and Wound  Healing Methods: Demonstration, Explain/Verbal Responses: State content correctly Wound/Skin Impairment: Handouts: Caring for Your Ulcer Methods: Demonstration, Explain/Verbal Responses: State content correctly Electronic Signature(s) Signed: 09/25/2018 3:38:22 PM By: [name] Entered By: [name] on 09/25/2018 10:04:52 [name], [name] ([id number]) -------------------------------------------------------------------------------- Wound Assessment Details Patient Name: Wittke, Castulo E. Date of Service: 09/25/2018 9:00 AM Medical Record Number: [id number] Patient Account Number: [id number] Date of Birth/Sex: 1950-05-15 (68 y.o. M) Treating RN: [name] Primary Care Caliyah Sieh: [name] Other Clinician: Referring Vaunda Gutterman: [name] Treating Dovid Bartko/Extender: [name] Weeks in Treatment: 104 Wound Status Wound Number: 16 Primary Diabetic Wound/Ulcer of the Lower Etiology: Extremity Wound Location: Right, Plantar Toe Great Wound Status: Open Wounding Event: Not Known Date Acquired: 10/29/2017 Weeks Of Treatment: 47 Clustered Wound: No Photos Photo Uploaded By: [name], BSN, RN, CWS, [name] on 09/25/2018 09:33:35 Wound Measurements Length: (cm) 1.5 Width: (cm) 1.8 Depth: (cm) 0.1 Area: (cm) 2.121 Volume: (cm) 0.212 % Reduction in Area: -32.4% % Reduction in Volume: -32.5% Wound Description Classification: Grade 1 Treatment Notes Wound #16 (Right, Plantar Toe Great) Notes Nystatin Power, SilverCell secured with Recruitment consultant) Signed: 09/25/2018 4:58:07 PM By: [name], BSN, RN, CWS, Kim RN, BSN Entered By: [name], BSN, RN, CWS, [name] on 09/25/2018 09:31:52 [name], [name] ([id number]) -------------------------------------------------------------------------------- Wound Assessment Details Patient Name: Sylvia, Zavier E. Date of Service: 09/25/2018 9:00 AM Medical Record Number: [id number] Patient Account Number: [id number] Date of Birth/Sex: 1950-09-30  (68 y.o. M) Treating RN: [name] Primary Care Thamara Leger: [name] Other Clinician: Referring Paige Monarrez: [name] Treating Lakeita Panther/Extender: [name] Weeks in Treatment: 104 Wound Status Wound Number: 18 Primary Diabetic Wound/Ulcer of the Lower Etiology: Extremity Wound Location: Right, Dorsal Toe Great Wound Status: Open Wounding Event: Gradually Appeared Date Acquired: 05/22/2018 Weeks Of Treatment: 18 Clustered Wound: No Photos Photo Uploaded By: [name], BSN, RN, CWS, [name] on 09/25/2018 16:30:15 Wound Measurements Length: (cm) 0.1 Width: (cm) 0.1 Depth: (cm) 0.1 Area: (cm) 0.008 Volume: (cm) 0.001 % Reduction in Area: 99% % Reduction in Volume: 98.8% Wound Description Classification: Grade 1 Treatment Notes Wound #18 (Right, Dorsal Toe Great) Notes Nystatin Power, SilverCell secured with Recruitment consultant) Signed: 09/25/2018 4:58:07 PM By: [name], BSN, RN, CWS, Kim RN, BSN Entered By: [name], BSN, RN, CWS, [name] on 09/25/2018 09:31:52 [name], [name] ([id number]) -------------------------------------------------------------------------------- Wound Assessment Details Patient Name: Emry, Linden E. Date of Service: 09/25/2018 9:00 AM Medical Record Number: [id number] Patient Account Number: [id number] Date of Birth/Sex: 19-Sep-1950 (68 y.o. M) Treating RN: [name] Primary Care Devereaux Grayson: [name] Other Clinician: Referring Oriah Leinweber: [name] Treating Rajendra Spiller/Extender: [name] Weeks in Treatment: 104 Wound Status Wound Number: 19 Primary Diabetic Wound/Ulcer of the Lower Etiology: Extremity Wound Location: Right, Dorsal Foot Wound Status: Healed - Epithelialized Wounding Event: Gradually Appeared Date Acquired: 09/05/2018 Weeks Of Treatment: 1 Clustered Wound: No Photos Photo Uploaded By: [name], BSN, RN, CWS, [name]  on 09/25/2018 16:30:53 Wound Measurements Length: (cm) 0 % Red Width: (cm) 0 % Red Depth: (cm) 0 Area:  (cm) 0 Volume: (cm) 0 uction in Area: 100% uction in Volume: 100% Wound Description Classification: Grade 2 Electronic Signature(s) Signed: 09/25/2018 3:38:22 PM By: [name] Entered By: [name] on 09/25/2018 09:59:09 [name], [name] ([id number]) -------------------------------------------------------------------------------- Wound Assessment Details Patient Name: Neece, Sloan E. Date of Service: 09/25/2018 9:00 AM Medical Record Number: [id number] Patient Account Number: [id number] Date of Birth/Sex: [birth year] (68 y.o. M) Treating RN: [name] Primary Care Jamaiyah Pyle: [name] Other Clinician: Referring Romeka Scifres: [name] Treating Sherra Kimmons/Extender: [name] Weeks in Treatment: 104 Wound Status Wound Number: 2 Primary Diabetic Wound/Ulcer of the Lower Etiology: Extremity Wound Location: Right Toe Second Wound Status: Open Wounding Event: Gradually Appeared Date Acquired: 06/11/2016 Weeks Of Treatment: 104 Clustered Wound: No Pending Amputation On Presentation Photos Photo Uploaded By: [name], BSN, RN, CWS, [name] on 09/25/2018 09:33:36 Wound Measurements Length: (cm) 0.2 Width: (cm) 0.3 Depth: (cm) 0.1 Area: (cm) 0.047 Volume: (cm) 0.005 % Reduction in Area: 98.5% % Reduction in Volume: 98.4% Wound Description Classification: Grade 2 Treatment Notes Wound #2 (Right Toe Second) Notes Nystatin Power, SilverCell secured with tape Electronic Signature(s) Signed: 09/25/2018 4:58:07 PM By: [name], BSN, RN, CWS, Kim RN, BSN Entered By: [name], BSN, RN, CWS, [name] on 09/25/2018 09:31:52 [name], [name] ([id number]) -------------------------------------------------------------------------------- Wound Assessment Details Patient Name: Queenan, Joselito E. Date of Service: 09/25/2018 9:00 AM Medical Record Number: [id number] Patient Account Number: [id number] Date of Birth/Sex: 09-Apr-1951 (68 y.o. M) Treating RN: [name] Primary Care Sanora Cunanan: [name]  Other Clinician: Referring Tammye Kahler: [name] Treating Wrangler Penning/Extender: [name] Weeks in Treatment: 104 Wound Status Wound Number: 20 Primary Diabetic Wound/Ulcer of the Lower Etiology: Extremity Wound Location: Left Toe Second Wound Status: Open Wounding Event: Pressure Injury Date Acquired: 09/22/2018 Weeks Of Treatment: 0 Clustered Wound: No Photos Photo Uploaded By: [name], BSN, RN, CWS, [name] on 09/25/2018 16:30:53 Wound Measurements Length: (cm) Width: (cm) Depth: (cm) Area: (cm) Volume: (cm) 2.5 % Reduction in Area: 2 % Reduction in Volume: 0.1 3.927 0.393 Treatment Notes Wound #20 (Left Toe Second) Notes Nystatin Power, SilverCell secured with tape Electronic Signature(s) Signed: 09/25/2018 4:58:07 PM By: [name], BSN, RN, CWS, Kim RN, BSN Entered By: [name], BSN, RN, CWS, [name] on 09/25/2018 09:31:52 Tlatelpa, [name] ([id number]) -------------------------------------------------------------------------------- [place] Details Patient Name: Sammons, Tyjuan E. Date of Service: 09/25/2018 9:00 AM Medical Record Number: [id number] Patient Account Number: [id number] Date of Birth/Sex: [birth year] (68 y.o. M) Treating RN: [name] Primary Care Yolanda Huffstetler: [name] Other Clinician: Referring Donetta Isaza: [name] Treating Geovanny Sartin/Extender: [name] Weeks in Treatment: 104 Vital Signs Time Taken: 09:23 Temperature (F): 98.2 Height (in): 69 Pulse (bpm): 78 Weight (lbs): 168 Respiratory Rate (breaths/min): 18 Body Mass Index (BMI): 24.8 Blood Pressure (mmHg): 143/68 Reference Range: 80 - 120 mg / dl Electronic Signature(s) Signed: 09/25/2018 4:58:07 PM By: [name], BSN, RN, CWS, Kim RN, BSN Entered By: [name], BSN, RN, CWS, [name] on 09/25/2018 45:99:77

## 2018-10-06 ENCOUNTER — Telehealth: Payer: Self-pay | Admitting: Oncology

## 2018-10-06 ENCOUNTER — Ambulatory Visit: Payer: Medicare Other

## 2018-10-06 NOTE — Telephone Encounter (Signed)
lvm with pt relative in contacts to adv of Covid-19 guidelines for appt regarding screening questions, temperature check, face mask required, and no visitors allowed

## 2018-10-07 ENCOUNTER — Other Ambulatory Visit: Payer: Self-pay

## 2018-10-07 ENCOUNTER — Other Ambulatory Visit: Payer: Self-pay | Admitting: Oncology

## 2018-10-07 ENCOUNTER — Inpatient Hospital Stay: Payer: Medicare Other

## 2018-10-07 VITALS — BP 150/78 | HR 75

## 2018-10-07 DIAGNOSIS — Z862 Personal history of diseases of the blood and blood-forming organs and certain disorders involving the immune mechanism: Secondary | ICD-10-CM

## 2018-10-07 DIAGNOSIS — D649 Anemia, unspecified: Secondary | ICD-10-CM

## 2018-10-07 DIAGNOSIS — D472 Monoclonal gammopathy: Secondary | ICD-10-CM

## 2018-10-07 DIAGNOSIS — E11621 Type 2 diabetes mellitus with foot ulcer: Secondary | ICD-10-CM | POA: Diagnosis not present

## 2018-10-07 DIAGNOSIS — N189 Chronic kidney disease, unspecified: Secondary | ICD-10-CM

## 2018-10-07 DIAGNOSIS — I130 Hypertensive heart and chronic kidney disease with heart failure and stage 1 through stage 4 chronic kidney disease, or unspecified chronic kidney disease: Secondary | ICD-10-CM | POA: Diagnosis not present

## 2018-10-07 DIAGNOSIS — D509 Iron deficiency anemia, unspecified: Secondary | ICD-10-CM

## 2018-10-07 LAB — CBC WITH DIFFERENTIAL/PLATELET
Abs Immature Granulocytes: 0.03 10*3/uL (ref 0.00–0.07)
Basophils Absolute: 0 10*3/uL (ref 0.0–0.1)
Basophils Relative: 0 %
Eosinophils Absolute: 0.2 10*3/uL (ref 0.0–0.5)
Eosinophils Relative: 3 %
HCT: 19.5 % — ABNORMAL LOW (ref 39.0–52.0)
Hemoglobin: 6.2 g/dL — ABNORMAL LOW (ref 13.0–17.0)
Immature Granulocytes: 0 %
Lymphocytes Relative: 11 %
Lymphs Abs: 0.8 10*3/uL (ref 0.7–4.0)
MCH: 29.8 pg (ref 26.0–34.0)
MCHC: 31.8 g/dL (ref 30.0–36.0)
MCV: 93.8 fL (ref 80.0–100.0)
Monocytes Absolute: 0.7 10*3/uL (ref 0.1–1.0)
Monocytes Relative: 10 %
Neutro Abs: 5.3 10*3/uL (ref 1.7–7.7)
Neutrophils Relative %: 76 %
Platelets: 140 10*3/uL — ABNORMAL LOW (ref 150–400)
RBC: 2.08 MIL/uL — ABNORMAL LOW (ref 4.22–5.81)
RDW: 14 % (ref 11.5–15.5)
WBC: 7.1 10*3/uL (ref 4.0–10.5)
nRBC: 0 % (ref 0.0–0.2)

## 2018-10-07 LAB — SAMPLE TO BLOOD BANK

## 2018-10-07 MED ORDER — EPOETIN ALFA-EPBX 40000 UNIT/ML IJ SOLN: 50000.0000 [IU] | Freq: Once | INTRAMUSCULAR | Status: DC

## 2018-10-07 MED ORDER — EPOETIN ALFA-EPBX 10000 UNIT/ML IJ SOLN
10000.0000 [IU] | Freq: Once | INTRAMUSCULAR | Status: AC
  Administered 2018-10-07: 10000 [IU] via SUBCUTANEOUS
  Filled 2018-10-07: qty 1

## 2018-10-07 MED ORDER — EPOETIN ALFA-EPBX 40000 UNIT/ML IJ SOLN
40000.0000 [IU] | Freq: Once | INTRAMUSCULAR | Status: AC
  Administered 2018-10-07: 40000 [IU] via SUBCUTANEOUS
  Filled 2018-10-07: qty 1

## 2018-10-07 NOTE — Progress Notes (Signed)
Here for aranesp

## 2018-10-08 ENCOUNTER — Inpatient Hospital Stay: Payer: Medicare Other | Attending: Oncology

## 2018-10-08 DIAGNOSIS — K219 Gastro-esophageal reflux disease without esophagitis: Secondary | ICD-10-CM | POA: Insufficient documentation

## 2018-10-08 DIAGNOSIS — I509 Heart failure, unspecified: Secondary | ICD-10-CM | POA: Diagnosis not present

## 2018-10-08 DIAGNOSIS — D472 Monoclonal gammopathy: Secondary | ICD-10-CM | POA: Diagnosis not present

## 2018-10-08 DIAGNOSIS — K7460 Unspecified cirrhosis of liver: Secondary | ICD-10-CM | POA: Diagnosis not present

## 2018-10-08 DIAGNOSIS — I130 Hypertensive heart and chronic kidney disease with heart failure and stage 1 through stage 4 chronic kidney disease, or unspecified chronic kidney disease: Secondary | ICD-10-CM | POA: Insufficient documentation

## 2018-10-08 DIAGNOSIS — N189 Chronic kidney disease, unspecified: Secondary | ICD-10-CM | POA: Diagnosis not present

## 2018-10-08 DIAGNOSIS — J449 Chronic obstructive pulmonary disease, unspecified: Secondary | ICD-10-CM | POA: Diagnosis not present

## 2018-10-08 DIAGNOSIS — E1122 Type 2 diabetes mellitus with diabetic chronic kidney disease: Secondary | ICD-10-CM | POA: Insufficient documentation

## 2018-10-08 DIAGNOSIS — Z79899 Other long term (current) drug therapy: Secondary | ICD-10-CM | POA: Diagnosis not present

## 2018-10-08 DIAGNOSIS — D631 Anemia in chronic kidney disease: Secondary | ICD-10-CM | POA: Insufficient documentation

## 2018-10-08 DIAGNOSIS — E611 Iron deficiency: Secondary | ICD-10-CM | POA: Diagnosis not present

## 2018-10-08 DIAGNOSIS — F17210 Nicotine dependence, cigarettes, uncomplicated: Secondary | ICD-10-CM | POA: Insufficient documentation

## 2018-10-08 LAB — PREPARE RBC (CROSSMATCH)

## 2018-10-09 ENCOUNTER — Ambulatory Visit: Payer: Medicare Other | Admitting: Physician Assistant

## 2018-10-09 ENCOUNTER — Other Ambulatory Visit: Payer: Self-pay

## 2018-10-09 ENCOUNTER — Encounter: Payer: Self-pay | Admitting: General Surgery

## 2018-10-09 ENCOUNTER — Ambulatory Visit (INDEPENDENT_AMBULATORY_CARE_PROVIDER_SITE_OTHER): Payer: Medicare Other | Admitting: General Surgery

## 2018-10-09 VITALS — BP 180/91 | HR 82 | Temp 98.1°F | Resp 22 | Ht 70.0 in

## 2018-10-09 DIAGNOSIS — L720 Epidermal cyst: Secondary | ICD-10-CM

## 2018-10-09 NOTE — Patient Instructions (Signed)
Follow up next Wednesday for suture removal Follow up is  Wednesday July 8th at 9:30 am   GENERAL POST-OPERATIVE PATIENT INSTRUCTIONS   WOUND CARE INSTRUCTIONS:  Keep a dry clean dressing on the wound if there is drainage. The initial bandage may be removed after 24 hours.  Once the wound has quit draining you may leave it open to air.  If clothing rubs against the wound or causes irritation and the wound is not draining you may cover it with a dry dressing during the daytime.  Try to keep the wound dry and avoid ointments on the wound unless directed to do so.  If the wound becomes bright red and painful or starts to drain infected material that is not clear, please contact your physician immediately.  If the wound is mildly pink and has a thick firm ridge underneath it, this is normal, and is referred to as a healing ridge.  This will resolve over the next 4-6 weeks.  BATHING: You may shower if you have been informed of this by your surgeon. However, Please do not submerge in a tub, hot tub, or pool until incisions are completely sealed or have been told by your surgeon that you may do so.  MEDICATIONS:  Try to take narcotic medications and anti-inflammatory medications, such as tylenol, ibuprofen, naprosyn, etc., with food.  This will minimize stomach upset from the medication.  Should you develop nausea and vomiting from the pain medication, or develop a rash, please discontinue the medication and contact your physician.  You should not drive, make important decisions, or operate machinery when taking narcotic pain medication.  SUNBLOCK Use sun block to incision area over the next year if this area will be exposed to sun. This helps decrease scarring and will allow you avoid a permanent darkened area over your incision.  QUESTIONS:  Please feel free to call our office if you have any questions, and we will be glad to assist you. [phone]

## 2018-10-09 NOTE — Progress Notes (Signed)
Patient ID: [name], male   DOB: [birth year], 68 y.o.   MRN: [id number]  Chief Complaint  Patient presents with  . Procedure    excision left cheek cyst    HPI [name] is a 68 y.o. male. Here for excision left cheek cyst. He states he doesn't feel well, having back pain. He also needs to go to [facility] for blood transfusion.  HPI  Past Medical History:  Diagnosis Date  . Anemia   . 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 CHF (congestive heart failure) ([place])   . Chronic kidney disease   . COPD (chronic obstructive pulmonary disease) ([place])    NO INHALERS  . Diabetes mellitus    NO MEDS  . GERD (gastroesophageal reflux disease)   . Hepatic cirrhosis ([place])   . Hypertension   . Leg swelling   . Loose, teeth    PATIENT STATES "HAS 6 TEETH, SOME ARE LOOSE"  . Neuromuscular disorder ([place])    RIGHT HAND AND FINGERS NUMB  . Shortness of breath dyspnea    WITH EXERTION   . 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 APPENDECTOMY    . COLONOSCOPY WITH PROPOFOL N/A 11/19/2014   Procedure: COLONOSCOPY WITH PROPOFOL;  Surgeon: Lucilla Lame, MD;  Location: [place];  Service: Endoscopy;  Laterality: N/A;  DIABETIC-ORAL MEDS  . COLONOSCOPY WITH PROPOFOL N/A 10/04/2016   Procedure: COLONOSCOPY WITH PROPOFOL;  Surgeon: Jonathon Bellows, MD;  Location: [facility] ENDOSCOPY;  Service: Endoscopy;  Laterality: N/A;  . ESOPHAGOGASTRODUODENOSCOPY (EGD) WITH PROPOFOL N/A 10/04/2016   Procedure: ESOPHAGOGASTRODUODENOSCOPY (EGD) WITH PROPOFOL;  Surgeon: Jonathon Bellows, MD;  Location[facility] ENDOSCOPY;  Service: Endoscopy;  Laterality: N/A;  . GIVENS CAPSULE STUDY N/A 11/13/2016   Procedure: GIVENS CAPSULE STUDY;  Surgeon: Jonathon Bellows, MD;  Location[facility] ENDOSCOPY;  Service: Gastroenterology;  Laterality: N/A;  . INCISION AND DRAINAGE PERIRECTAL ABSCESS Right  12/16/2017   Procedure: EXCISION BASAL CELL CANCER/ GLUTEAL ABSCESS AND FLAP COVERAGE;  Surgeon: Robert Bellow, MD;  Location: [facility] ORS;  Service: General;  Laterality: Right;  . INGUINAL LYMPH NODE BIOPSY Right 01/26/2016   Procedure: INGUINAL LYMPH NODE BIOPSY;  Surgeon: Clayburn Pert, MD;  Location: [facility] ORS;  Service: General;  Laterality: Right;  . RECTAL BIOPSY N/A 11/02/2014   Procedure: BIOPSY RECTAL;  Surgeon: Marlyce Huge, MD;  Location: [facility] ORS;  Service: General;  Laterality: N/A;  . RECTAL EXAM UNDER ANESTHESIA N/A 11/02/2014   Procedure: RECTAL EXAM UNDER ANESTHESIA;  Surgeon: Marlyce Huge, MD;  Location: [facility] ORS;  Service: General;  Laterality: N/A;  . TONSILLECTOMY      Family History  Problem Relation Age of Onset  . Asthma Mother   . Colon cancer Neg Hx     Social History Social History   Tobacco Use  . Smoking status: Current Every Day Smoker    Packs/day: 0.25    Years: 20.00    Pack years: 5.00    Types: Cigarettes  . Smokeless tobacco: Never Used  . Tobacco comment: 3-4 cigarettes per day  Substance Use Topics  . Alcohol use: Not Currently  . Drug use: Not Currently    Comment: Last Use 2016 per patient-+ UDS FOR COCAINE IN 2014    No Known Allergies  Current Outpatient Medications  Medication Sig Dispense Refill  . amLODipine (NORVASC) 10 MG tablet  Take 10 mg by mouth 2 (two) times daily.     Marland Kitchen atorvastatin (LIPITOR) 40 MG tablet Take 40 mg by mouth every morning.     . bismuth subsalicylate (PEPTO BISMOL) 262 MG/15ML suspension Take 30 mLs by mouth every 6 (six) hours as needed for indigestion or diarrhea or loose stools.     . docusate sodium (COLACE) 100 MG capsule Take 1 capsule (100 mg total) by mouth 2 (two) times daily. 10 capsule 0  . ferrous sulfate 325 (65 FE) MG tablet Take 325 mg by mouth daily.     . furosemide (LASIX) 20 MG tablet     . hydrALAZINE (APRESOLINE) 25 MG tablet Take 25 mg 2 (two) times daily by mouth.     Marland Kitchen omeprazole (PRILOSEC) 40 MG capsule Take 40 mg by mouth every morning.     . polyethylene glycol (MIRALAX / GLYCOLAX) packet Take 17 g by mouth daily as needed for mild constipation.     . sodium bicarbonate 650 MG tablet Take 1 tablet (650 mg total) by mouth 3 (three) times daily. 20 tablet 0  . torsemide (DEMADEX) 20 MG tablet Take 20 mg by mouth daily.    . vitamin B-12 (CYANOCOBALAMIN) 1000 MCG tablet Take 1 tablet (1,000 mcg total) by mouth daily. 30 tablet 3  . Vitamin D, Ergocalciferol, (DRISDOL) 50000 units CAPS capsule Take 50,000 Units by mouth every Tuesday.      No current facility-administered medications for this visit.    Facility-Administered Medications Ordered in Other Visits  Medication Dose Route Frequency Provider Last Rate Last Dose  . epoetin alfa-epbx (RETACRIT) injection 10,000 Units  10,000 Units Subcutaneous Once Sindy Guadeloupe, MD        Review of Systems Review of Systems  Blood pressure (!) 180/91, pulse 82, temperature 98.1 F (36.7 C), temperature source Temporal, resp. rate (!) 22, height 5\' 10"  (1.778 m), SpO2 98 %.  Physical Exam Physical Exam Neck:      Data Reviewed The procedure was reviewed and the patient was amenable to proceed.  The skin was cleansed with alcohol followed by 10 cc of 0.5% Xylocaine with 0.25% Marcaine with 1-200,000's of epinephrine.  ChloraPrep was applied to the skin in the area draped.  The lesion was excised through elliptical incision including the central pore.  The entire cyst lining was removed.  The wound was closed in a single layer with 4-0 nylon horizontal mattress sutures to obliterate the dead space.  Telfa and Tegaderm dressing applied.  Ice pack provided.  Assessment Sebaceous cyst of the left cheek.  Plan The patient will return for suture removal in 1 week with the office staff.    Forest Gleason Daianna Vasques 10/10/2018, 1:00 PM

## 2018-10-10 LAB — TYPE AND SCREEN
ABO/RH(D): A POS
Antibody Screen: NEGATIVE
Unit division: 0

## 2018-10-10 LAB — BPAM RBC
Blood Product Expiration Date: 202007182359
Unit Type and Rh: 6200

## 2018-10-10 LAB — PREPARE RBC (CROSSMATCH)

## 2018-10-14 ENCOUNTER — Ambulatory Visit: Payer: Medicare Other

## 2018-10-15 ENCOUNTER — Ambulatory Visit: Payer: Self-pay

## 2018-10-15 ENCOUNTER — Other Ambulatory Visit: Payer: Self-pay

## 2018-10-15 DIAGNOSIS — L720 Epidermal cyst: Secondary | ICD-10-CM

## 2018-10-15 NOTE — Progress Notes (Signed)
Patient ID: [name], male   DOB: May 22, 1950, 68 y.o.   MRN: [id number] Patient came in today for a wound check.  The wound is clean, with no signs of infection noted. Sutures removed. Patient notified of his pathology results, skin cyst. Follow up as needed.

## 2018-10-16 ENCOUNTER — Other Ambulatory Visit: Payer: Self-pay

## 2018-10-16 ENCOUNTER — Encounter: Payer: Medicare Other | Attending: Physician Assistant | Admitting: Physician Assistant

## 2018-10-19 NOTE — Progress Notes (Addendum)
[name], [name] ([id number]) Visit Report for 10/16/2018 Arrival Information Details Patient Name: [name], [name] Date of Service: 10/16/2018 9:00 AM Medical Record Number: [id number] Patient Account Number: [id number] Date of Birth/Sex: 02-21-1951 (68 y.o. M) Treating RN: Army Melia Primary Care Cindel Daugherty: Lamonte Sakai Other Clinician: Referring Trelyn Vanderlinde: Lamonte Sakai Treating Dilia Alemany/Extender: Melburn Hake, HOYT Weeks in Treatment: 73 Visit Information History Since Last Visit Added or deleted any medications: No Patient Arrived: Ambulatory Any new allergies or adverse reactions: No Arrival Time: 09:07 Had a fall or experienced change in No Accompanied By: self activities of daily living that may affect Transfer Assistance: None risk of falls: Patient Requires Transmission-Based No Signs or symptoms of abuse/neglect since last visito No Precautions: Hospitalized since last visit: No Patient Has Alerts: Yes Has Dressing in Place as Prescribed: Yes Patient Alerts: DMII Pain Present Now: No Electronic Signature(s) Signed: 10/16/2018 2:48:20 PM By: Army Melia Entered By: Army Melia on 10/16/2018 09:08:11 Siguenza, Dysen Johnette Abraham ([id number]) -------------------------------------------------------------------------------- Clinic Level of Care Assessment Details Patient Name: [name], [name] Date of Service: 10/16/2018 9:00 AM Medical Record Number: [id number] Patient Account Number: [id number] Date of Birth/Sex: [birth year] (68 y.o. M) Treating RN: Army Melia Primary Care Brenlyn Beshara: Lamonte Sakai Other Clinician: Referring Kamora Vossler: Lamonte Sakai Treating Keygan Dumond/Extender: Melburn Hake, HOYT Weeks in Treatment: 107 Clinic Level of Care Assessment Items TOOL 4 Quantity Score []  - Use when only an EandM is performed on FOLLOW-UP visit 0 ASSESSMENTS - Nursing Assessment / Reassessment X - Reassessment of Co-morbidities (includes updates in patient status) 1 10 X- 1 5 Reassessment of Adherence  to Treatment Plan ASSESSMENTS - Wound and Skin Assessment / Reassessment []  - Simple Wound Assessment / Reassessment - one wound 0 X- 6 5 Complex Wound Assessment / Reassessment - multiple wounds []  - 0 Dermatologic / Skin Assessment (not related to wound area) ASSESSMENTS - Focused Assessment []  - Circumferential Edema Measurements - multi extremities 0 []  - 0 Nutritional Assessment / Counseling / Intervention []  - 0 Lower Extremity Assessment (monofilament, tuning fork, pulses) []  - 0 Peripheral Arterial Disease Assessment (using hand held doppler) ASSESSMENTS - Ostomy and/or Continence Assessment and Care []  - Incontinence Assessment and Management 0 []  - 0 Ostomy Care Assessment and Management (repouching, etc.) PROCESS - Coordination of Care X - Simple Patient / Family Education for ongoing care 1 15 []  - 0 Complex (extensive) Patient / Family Education for ongoing care []  - 0 Staff obtains Programmer, systems, Records, Test Results / Process Orders []  - 0 Staff telephones HHA, Nursing Homes / Clarify orders / etc []  - 0 Routine Transfer to another Facility (non-emergent condition) []  - 0 Routine Hospital Admission (non-emergent condition) []  - 0 New Admissions / Biomedical engineer / Ordering NPWT, Apligraf, etc. []  - 0 Emergency Hospital Admission (emergent condition) X- 1 10 Simple Discharge Coordination [name], [name] ([id number]) []  - 0 Complex (extensive) Discharge Coordination PROCESS - Special Needs []  - Pediatric / Minor Patient Management 0 []  - 0 Isolation Patient Management []  - 0 Hearing / Language / Visual special needs []  - 0 Assessment of Community assistance (transportation, D/C planning, etc.) []  - 0 Additional assistance / Altered mentation []  - 0 Support Surface(s) Assessment (bed, cushion, seat, etc.) INTERVENTIONS - Wound Cleansing / Measurement []  - Simple Wound Cleansing - one wound 0 X- 6 5 Complex Wound Cleansing - multiple wounds X- 1  5 Wound Imaging (photographs - any number of wounds) []  - 0 Wound Tracing (instead of photographs) []  - 0 Simple  Wound Measurement - one wound X- 6 5 Complex Wound Measurement - multiple wounds INTERVENTIONS - Wound Dressings []  - Small Wound Dressing one or multiple wounds 0 X- 6 15 Medium Wound Dressing one or multiple wounds []  - 0 Large Wound Dressing one or multiple wounds []  - 0 Application of Medications - topical []  - 0 Application of Medications - injection INTERVENTIONS - Miscellaneous []  - External ear exam 0 []  - 0 Specimen Collection (cultures, biopsies, blood, body fluids, etc.) []  - 0 Specimen(s) / Culture(s) sent or taken to Lab for analysis []  - 0 Patient Transfer (multiple staff / Civil Service fast streamer / Similar devices) []  - 0 Simple Staple / Suture removal (25 or less) []  - 0 Complex Staple / Suture removal (26 or more) []  - 0 Hypo / Hyperglycemic Management (close monitor of Blood Glucose) []  - 0 Ankle / Brachial Index (ABI) - do not check if billed separately X- 1 5 Vital Signs [name], [name] ([id number]) Has the patient been seen at the hospital within the last three years: Yes Total Score: 230 Level Of Care: New/Established - Level 5 Electronic Signature(s) Signed: 10/21/2018 10:16:02 AM By: Army Melia Entered By: Army Melia on 10/20/2018 16:34:59 Frisch, [name] ([id number]) -------------------------------------------------------------------------------- Lower Extremity Assessment Details Patient Name: [name], [name] Date of Service: 10/16/2018 9:00 AM Medical Record Number: [id number] Patient Account Number: [id number] Date of Birth/Sex: 1950-09-11 (68 y.o. M) Treating RN: Army Melia Primary Care Aalaya Yadao: Lamonte Sakai Other Clinician: Referring Modestine Scherzinger: Lamonte Sakai Treating Marcelene Weidemann/Extender: Melburn Hake, HOYT Weeks in Treatment: 107 Edema Assessment Assessed: [Left: No] [Right: No] Edema: [Left: No] [Right: No] Vascular  Assessment Pulses: Dorsalis Pedis Palpable: [Left:Yes] [Right:Yes] Electronic Signature(s) Signed: 10/16/2018 2:48:20 PM By: Army Melia Entered By: Army Melia on 10/16/2018 09:23:23 Milnes, Areli E. ([id number]) -------------------------------------------------------------------------------- Pain Assessment Details Patient Name: Suppes, Deandra E. Date of Service: 10/16/2018 9:00 AM Medical Record Number: [id number] Patient Account Number: [id number] Date of Birth/Sex: [birth year] (68 y.o. M) Treating RN: Army Melia Primary Care Tarin Johndrow: Lamonte Sakai Other Clinician: Referring Cuthbert Turton: Lamonte Sakai Treating Jerra Huckeby/Extender: Melburn Hake, HOYT Weeks in Treatment: 107 Active Problems Location of Pain Severity and Description of Pain Patient Has Paino No Site Locations Pain Management and Medication Current Pain Management: Electronic Signature(s) Signed: 10/16/2018 2:48:20 PM By: Army Melia Entered By: Army Melia on 10/16/2018 09:08:16 Filosa, [name] ([id number]) -------------------------------------------------------------------------------- Patient/Caregiver Education Details Patient Name: Menor, Jamori E. Date of Service: 10/16/2018 9:00 AM Medical Record Number: [id number] Patient Account Number: [id number] Date of Birth/Gender: Dec 10, 1950 (68 y.o. M) Treating RN: Army Melia Primary Care Physician: Lamonte Sakai Other Clinician: Referring Physician: Lamonte Sakai Treating Physician/Extender: Sharalyn Ink in Treatment: 107 Education Assessment Education Provided To: Patient Education Topics Provided Wound/Skin Impairment: Handouts: Caring for Your Ulcer Methods: Demonstration, Explain/Verbal Responses: State content correctly Electronic Signature(s) Signed: 10/21/2018 10:16:02 AM By: Army Melia Entered By: Army Melia on 10/20/2018 16:35:17 Eckstein, [name] ([id number]) -------------------------------------------------------------------------------- Wound  Assessment Details Patient Name: Bostick, Keijuan E. Date of Service: 10/16/2018 9:00 AM Medical Record Number: [id number] Patient Account Number: [id number] Date of Birth/Sex: [birth year] (68 y.o. M) Treating RN: Army Melia Primary Care Cornelio Parkerson: Lamonte Sakai Other Clinician: Referring Malasha Kleppe: Lamonte Sakai Treating Burtis Imhoff/Extender: Melburn Hake, HOYT Weeks in Treatment: 107 Wound Status Wound Number: 16 Primary Diabetic Wound/Ulcer of the Lower Etiology: Extremity Wound Location: Right, Plantar Toe Great Wound Status: Open Wounding Event: Not Known Date Acquired: 10/29/2017 Weeks Of Treatment: 50 Clustered Wound: No Photos Photo Uploaded By: Gretta Cool, BSN, RN,  CWS, Kim on 10/16/2018 15:57:00 Wound Measurements Length: (cm) 1.1 Width: (cm) 2.2 Depth: (cm) 0.1 Area: (cm) 1.901 Volume: (cm) 0.19 % Reduction in Area: -18.7% % Reduction in Volume: -18.7% Wound Description Classification: Grade 1 Electronic Signature(s) Signed: 10/16/2018 2:48:20 PM By: Army Melia Entered By: Army Melia on 10/16/2018 09:22:07 Troung, Gleen E. ([id number]) -------------------------------------------------------------------------------- Wound Assessment Details Patient Name: Bottenfield, Youcef E. Date of Service: 10/16/2018 9:00 AM Medical Record Number: [id number] Patient Account Number: [id number] Date of Birth/Sex: 05-07-1950 (68 y.o. M) Treating RN: Army Melia Primary Care Tkai Serfass: Lamonte Sakai Other Clinician: Referring Sherolyn Trettin: Lamonte Sakai Treating Taryn Nave/Extender: Melburn Hake, HOYT Weeks in Treatment: 107 Wound Status Wound Number: 18 Primary Diabetic Wound/Ulcer of the Lower Etiology: Extremity Wound Location: Right, Dorsal Toe Great Wound Status: Open Wounding Event: Gradually Appeared Date Acquired: 05/22/2018 Weeks Of Treatment: 21 Clustered Wound: No Photos Photo Uploaded By: Gretta Cool, BSN, RN, CWS, Kim on 10/16/2018 15:57:01 Wound Measurements Length: (cm) 2 Width: (cm) 3.5 Depth:  (cm) 0.1 Area: (cm) 5.498 Volume: (cm) 0.55 % Reduction in Area: -586.4% % Reduction in Volume: -587.5% Wound Description Classification: Grade 1 Electronic Signature(s) Signed: 10/16/2018 2:48:20 PM By: Army Melia Entered By: Army Melia on 10/16/2018 09:22:08 Giammarino, Daemion E. ([id number]) -------------------------------------------------------------------------------- Wound Assessment Details Patient Name: Dorko, Hargis E. Date of Service: 10/16/2018 9:00 AM Medical Record Number: [id number] Patient Account Number: [id number] Date of Birth/Sex: February 20, 1951 (68 y.o. M) Treating RN: Army Melia Primary Care Stori Royse: Lamonte Sakai Other Clinician: Referring Bluford Sedler: Lamonte Sakai Treating Kalana Yust/Extender: Melburn Hake, HOYT Weeks in Treatment: 107 Wound Status Wound Number: 2 Primary Diabetic Wound/Ulcer of the Lower Etiology: Extremity Wound Location: Right Toe Second Wound Status: Open Wounding Event: Gradually Appeared Date Acquired: 06/11/2016 Weeks Of Treatment: 107 Clustered Wound: No Pending Amputation On Presentation Photos Photo Uploaded By: Gretta Cool, BSN, RN, CWS, Kim on 10/16/2018 15:58:20 Wound Measurements Length: (cm) 2 Width: (cm) 1.7 Depth: (cm) 0.1 Area: (cm) 2.67 Volume: (cm) 0.267 % Reduction in Area: 13.1% % Reduction in Volume: 13% Wound Description Classification: Grade 2 Electronic Signature(s) Signed: 10/16/2018 2:48:20 PM By: Army Melia Entered By: Army Melia on 10/16/2018 09:22:08 Cornman, Jevin E. ([id number]) -------------------------------------------------------------------------------- Wound Assessment Details Patient Name: Macnair, Kaulana E. Date of Service: 10/16/2018 9:00 AM Medical Record Number: [id number] Patient Account Number: [id number] Date of Birth/Sex: 01-25-1951 (68 y.o. M) Treating RN: Army Melia Primary Care Genowefa Morga: Lamonte Sakai Other Clinician: Referring Deneka Greenwalt: Lamonte Sakai Treating Kaydince Towles/Extender: Melburn Hake,  HOYT Weeks in Treatment: 107 Wound Status Wound Number: 20 Primary Diabetic Wound/Ulcer of the Lower Etiology: Extremity Wound Location: Left Toe Second Wound Status: Open Wounding Event: Pressure Injury Date Acquired: 09/22/2018 Weeks Of Treatment: 3 Clustered Wound: No Photos Photo Uploaded By: Gretta Cool, BSN, RN, CWS, Kim on 10/16/2018 15:58:21 Wound Measurements Length: (cm) 0.1 Width: (cm) 0.1 Depth: (cm) 0.1 Area: (cm) 0.008 Volume: (cm) 0.001 % Reduction in Area: 99.8% % Reduction in Volume: 99.7% Electronic Signature(s) Signed: 10/16/2018 2:48:20 PM By: Army Melia Entered By: Army Melia on 10/16/2018 09:22:08 Spindle, Brett E. ([id number]) -------------------------------------------------------------------------------- Wound Assessment Details Patient Name: Massaro, Maxson E. Date of Service: 10/16/2018 9:00 AM Medical Record Number: [id number] Patient Account Number: [id number] Date of Birth/Sex: [birth year] (68 y.o. M) Treating RN: Army Melia Primary Care Jadie Comas: Lamonte Sakai Other Clinician: Referring Karmyn Lowman: Lamonte Sakai Treating Audianna Landgren/Extender: Melburn Hake, HOYT Weeks in Treatment: 107 Wound Status Wound Number: 21 Primary Diabetic Wound/Ulcer of the Lower Etiology: Extremity Wound Location: Left Toe Great Wound Status: Open Wounding Event: Shear/Friction Date Acquired: 10/13/2018 Weeks  Of Treatment: 0 Clustered Wound: No Photos Photo Uploaded By: Gretta Cool, BSN, RN, CWS, Kim on 10/16/2018 16:00:17 Wound Measurements Length: (cm) Width: (cm) Depth: (cm) Area: (cm) Volume: (cm) 1 % Reduction in Area: 5 % Reduction in Volume: 0.1 3.927 0.393 Electronic Signature(s) Signed: 10/16/2018 2:48:20 PM By: Army Melia Entered By: Army Melia on 10/16/2018 09:22:08 Yusupov, Johndavid E. ([id number]) -------------------------------------------------------------------------------- Wound Assessment Details Patient Name: Snapp, Angello E. Date of Service: 10/16/2018  9:00 AM Medical Record Number: [id number] Patient Account Number: [id number] Date of Birth/Sex: 1950-12-22 (68 y.o. M) Treating RN: Army Melia Primary Care Journey [place]: Lamonte Sakai Other Clinician: Referring Abdulraheem Pineo: Lamonte Sakai Treating Monisha Siebel/Extender: Melburn Hake, HOYT Weeks in Treatment: 107 Wound Status Wound Number: 22 Primary Diabetic Wound/Ulcer of the Lower Etiology: Extremity Wound Location: Right, Distal, Dorsal Foot Wound Status: Open Wounding Event: Gradually Appeared Date Acquired: 10/13/2018 Weeks Of Treatment: 0 Clustered Wound: No Photos Photo Uploaded By: Gretta Cool, BSN, RN, CWS, Kim on 10/16/2018 16:00:18 Wound Measurements Length: (cm) 3 % Re Width: (cm) 4.5 % Re Depth: (cm) 0.1 Area: (cm) 10.603 Volume: (cm) 1.06 duction in Area: duction in Volume: Electronic Signature(s) Signed: 10/16/2018 2:48:20 PM By: Army Melia Entered By: Army Melia on 10/16/2018 09:22:09 Mandala, Deontre E. ([id number]) -------------------------------------------------------------------------------- Vitals Details Patient Name: Fries, Romel E. Date of Service: 10/16/2018 9:00 AM Medical Record Number: [id number] Patient Account Number: [id number] Date of Birth/Sex: Aug 28, 1950 (68 y.o. M) Treating RN: Army Melia Primary Care Lavere Stork: Lamonte Sakai Other Clinician: Referring Margia Wiesen: Lamonte Sakai Treating Camauri [name]/Extender: Melburn Hake, HOYT Weeks in Treatment: 107 Vital Signs Time Taken: 09:08 Temperature (F): 98.2 Height (in): 69 Pulse (bpm): 77 Weight (lbs): 168 Respiratory Rate (breaths/min): 16 Body Mass Index (BMI): 24.8 Blood Pressure (mmHg): 158/79 Reference Range: 80 - 120 mg / dl Electronic Signature(s) Signed: 10/16/2018 2:48:20 PM By: Army Melia Entered By: Army Melia on 10/16/2018 09:08:32

## 2018-10-19 NOTE — Progress Notes (Signed)
ZAKHARI, FOGEL ([id number]) Visit Report for 10/16/2018 Chief Complaint Document Details Patient Name: [name], [name] Date of Service: 10/16/2018 9:00 AM Medical Record Number: [id number] Patient Account Number: [id number] Date of Birth/Sex: 07-23-1950 (68 y.o. M) Treating RN: Army Melia Primary Care Provider: Lamonte Sakai Other Clinician: Referring Provider: Lamonte Sakai Treating Provider/Extender: Melburn Hake, HOYT Weeks in Treatment: 107 Information Obtained from: Patient Chief Complaint Patient presents for treatment of an open diabetic ulcer to both feet Electronic Signature(s) Signed: 10/19/2018 4:13:38 PM By: Worthy Keeler PA-C Entered By: Worthy Keeler on 10/16/2018 09:32:25 Plotts, [name] ([id number]) -------------------------------------------------------------------------------- HPI Details Patient Name: [name], [name] Date of Service: 10/16/2018 9:00 AM Medical Record Number: [id number] Patient Account Number: [id number] Date of Birth/Sex: [birth year] (68 y.o. M) Treating RN: Army Melia Primary Care Provider: Lamonte Sakai Other Clinician: Referring Provider: Lamonte Sakai Treating Provider/Extender: Melburn Hake, HOYT Weeks in Treatment: 107 History of Present Illness HPI Description: 68 year old patient here to see as for bilateral feet ulceration to on his left first and second toe and 2 on his right first and second toe, which she's had for about 4 months. 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 Donella Stade. The patient is also undergoing workup by medical oncology for a intra-abdominal lymphadenopathy. 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 He is also status post appendectomy, inguinal lymph node biopsy, rectal biopsy and  rectal examination under anesthesia. he currently smokes cigarettes about half packet a day. In March of this year he was seen by Dr. Hortencia Pilar, for evaluation of bilateral varicose veins and besides wearing compression stockings he had recommended laser ablation of the right and left great saphenous veins to eleviate the symptoms and complications of severe superficial venous reflux disease. He also recommended lymphedema pumps for better control of his lymphedema. The patient recently has had on 08/23/2016, right greater saphenous vein ablation with the laser energy Earlier lower extremity venous reflux examination done on 05/08/2016 showed no DVT or SVT both lower legs but incompetence of bilateral great saphenous veins was present. A lower arterial study was also done and there was no significant right lower and left lower extremity problems based on a normal toe brachial index bilaterally and the ABI was 1.21 the left and 1.23 on the right. His post ablation venous duplex examination showed successful ablation of the right GS vein with thrombus formation 2 below the right saphenofemoral junction. The deep system was patent without evidence of thrombosis and this was done on 08/30/2016. 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 Addendum: regarding his x-rays done today and x-ray of the left foot -- IMPRESSION: No objective evidence of osteomyelitis. There are soft tissue changes which may reflect cellulitis. X-ray of the right foot -- IMPRESSION:Findings compatible with cellulitis of the toes. No objective evidence of osteomyelitis is observed. 10/01/16 on evaluation today patient's wounds appeared to be doing some better. I did review the x-rays as well which showed no evidence of osteomyelitis although there was evidence on x-ray of cellulitis. He fortunately is not having  any discomfort although he continues to have some swelling. He does not remember being on any antibiotics  recently. 10/15/16 on evaluation today patient's wounds overall appear to be doing better although he does have a new location noted on the left foot. Fortunately he is not having significant pain. It almost has the appearance that something is rubbing on the end of his toes but he wears the open toe shoes and according to what he is telling me never wears anything that would rub on his foot. There is no evidence of infection and specifically no evidence of a fungal infection 10/22/16 On evaluation today patient's wounds appeared to be doing better compared to last week in regard to his bilateral lower extremities. Fortunately I happy with how things are progressing although he still has ulcers I feel like that he is improving and appropriate manner. 11/12/16 on evaluation today patient appears to be doing well in regard to his bilateral feet and the respective wounds. We have been using surrounding her dressings along with an antifungal cream which seems to be doing very well. He has no bilateral dysfunction noticed that the rituals are weight loss at this point. He also has no nausea or vomiting a note purulent discharge. He did see Vein and vascular today and he tells me that they told him he could have surgery for his venous stasis but they did not feel like it was worth it in his words. Fortunately patient's wounds do appear to be getting sneakily better. AC, COLAN ([id number]) 11/26/2016 -- he says he is going to have some surgery during this week at [facility] for possibly a colon resection. 12/31/2016 -- the patient has been noncompliant with his smoking and I'm not sure whether he is also started drinking again. He continues to be very nonchalant about his care 01/14/2017 -- the patient's HandP has been reviewed well and I understand he is being compliant with trying to give up  smoking and his local dressing changes. He does not have any surgical options of 4 to him by his vascular surgeons.he was last seen in early August by Dr. Hortencia Pilar who recommended compression stockings,and possibly lymph pumps in 2-3 months after doing a review ultrasound. 01/28/2017 - the patient did not have any fresh complaints but on examination I noted a large lacerated wound on the plantar aspect of his right fourth toe which had a lot of necrotic debris and it probes down to bone. 02/07/2017 -- x-ray of the right foot -- IMPRESSION: Soft tissue swelling about the first through fourth toes consistent with cellulitis. New destructive change in the tuft of the distal phalanx of the great toe is consistent with osteomyelitis. 02/14/2017 -- the patient's MRI is pending this coming Monday and he still continues to smoke. We have again gone over off loading of his wounds in great detail and he says he's been compliant. 02/21/2017 -- MR of the right foot -- IMPRESSION: 1. Soft tissue ulcer at the tip of the first, second and third toe knows. Cortical irregularity and bone marrow edema in the first distal phalanx most concerning for osteomyelitis. Mild marrow edema in the second and third distal phalanx without definite cortical destruction which may reflect early osteomyelitis versus reactive marrow edema. 2. Soft tissue edema surrounding the first phalanx most consistent with cellulitis. the patient was also recently evaluated by his medical oncologist Dr. 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 She is treating him with weekly Procrit. She is also keeping intra-abdominal lymphadenopathy and right lower  lobe lung nodule under observation. 04/04/2017 -- he was seen by Dr. Adrian Prows on 03/25/2017 -- after review he empirically put him on ciprofloxacin and doxycycline as they have good bone penetration and good  bioavailability and it will cover the usual pathogens and diabetic foot osteomyelitis. He will check inflammatory markers and plan a 67-89 week old records. C-reactive protein was 0.3 and the ESR was 72 04/18/17 on evaluation today patient appears to be doing about the same in regard to his lower extremity wounds bilaterally. He has continued to use the antifungal cream which does seem to be beneficial. Nonetheless the ulcers do seem to in some areas be epithelial eyes over and in other areas are still open. He is having no significant discomfort. 04/25/17-he is here in follow-up evaluation for multiple ulcerations to multiple toes bilaterally. He states he did see Dr. Ola Spurr again last week and continues antibiotic therapy. He is voicing no complaints or concerns, will continue with current treatment plan will possibility of adding compression therapy next week after an additional week of treatment/lotions to BLE prescribed by Dr Ola Spurr 05/02/17 he is here in follow up for for multiple ulcers to multiple toes bilaterally. we will stop using antifungal cream and will continue with silvercel and follow up next week 05/09/17-he is here in follow-up for multiple ulcerations to multiple toes bilaterally. There is improvement in appearance. He has not completely stopped using antifungal cream, but admits he has not using it between the toes. He has an appointment with Dr. Ola Spurr on 2/11, continues on doxycycline and Cipro. It has been 5 weeks of antibiotic therapy, we will order plain film xray to evaluate for osteomyelitis next week, prior to follow up with ID. Will continue with silvercel and follow up next week 05/16/17-he is here in follow-up evaluation for multiple ulcerations to multiple toes bilaterally and new wound to the right posterior heel. There is essentially no change in appearance, deteriorating measurements; he has a history of waxing and waning measurements. He admits that he  continues to apply moisturizer/cream/ointment to his toes despite weekly reminders to only apply silvercel to his toes. He states that he thinks the surgical shoe contributed to the superficial ulcer to his posterior heel, he is unable to articulate if this was an area of dry cracked skin as he has a similar area to the left heel. He now is wearing open toed slippers. He has an appointment with Dr. Ola Spurr on 2/11. We have ordered x-rays for her bilateral feet; he was advised to obtain the x-rays today or tomorrow. He will follow-up next week 05/23/17-he is here in follow-up evaluation for multiple ulcerations to multiple toes bilaterally and the right posterior heel. There is improvement in maceration. He has been compliant and not applying any moisturizing agent to his toes. He has been using Lac-Hydrin for his lower extremities with improvement. He did not go to his appointment on Monday with Dr. Ola Spurr secondary to financial concerns. X-rays for her bilateral feet showed: LEFT FOOT with slight erosion of the tuft of the distal phalanges of the left first and second toe suspicious for osteomyelitis, RIGHT FOOT with 1.erosion of the tufts of the distal Krass, Celeste E. ([id number]) phalanges of the right first second and possibly third toes consistent with osteomyelitis, 2 no definitive abnormality of the calcaneus is seen on the images obtained, 3. Plantar calcaneal degenerative spur. We briefly discussed hyperbaric adjunctive therapy for treatment of chronic refractory osteomyelitis. I do not find an a1c in  EMR, will contact PCP for record, or order if needed. He has been encouraged to contact Dr Ola Spurr office regarding the follow-up appointment, encouraged him to inquire about payment plan. We will continue with same treatment plan and follow-up next week. He states he is still taking antibiotics and has "a lot" left. He states he has been taking them as directed, 2 pills twice daily.  According to Dr. Blane Ohara office notes he was originally started on 12/17 for 4 weeks and extended on 1/14 for an additional 4 weeks. He should be done with his antibiotic therapy, he was advised to bring his bottles and to his next appointment, we will contact pharmacy. 05/30/17-he is here in follow-up evaluation for multiple ulcerations to multiple toes bilaterally and the right posterior heel. He is accompanied by his brother-in-law. Wounds are stable. He has yet to make up with Dr. Ola Spurr. We contacted his PCP, with no record of recent A1c we will draw an A1c. His brother-in-law states that he was taken off all of his diabetic medication secondary to kidney function. 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 The antibiotics were counted and have approximately 2 weeks left, although they should be complete. The pill organizer reveals missing days. We discussed the need for consistent medications, to have optimal benefit of medication. He has a cousin that lives with him and he will ask her to check his organizer daily. He has been advised to follow up with Dr Ola Spurr, and will go by the office today. He has been advised to quit smoking. 06/06/17-he is here in follow up evaluation. He has had to make an appointment with Dr. Ola Spurr. He did have blood work obtained, a1c 5. He continues to take antibiotic therapy. Significant improvement in bilateral lower extremity edema with compression therapy. Essentially no change in ulcerations to toes. He states he is "going to try something different" and "let me know next week" if it works; he would not provide any additional information and was encouraged to follow our orders. We will follow up next week 06/13/17-he is here in follow-up evaluation. He has an appointment with Dr. Ola Spurr tomorrow morning. He states he purchased an ointment from  [facility] and applied to his toes for 3 days, he does not remember the name of the ointment. There is improvement to his wounds, minimal maceration. He continues to take antibiotic therapy, this should have been completed last month. His brother-in-law who regularly accompanies his appointments was asked to take the bottles to the appointment tomorrow with Dr. Ola Spurr so he is aware. We will continue with 3 layer compression, and order OPEN TOE compression 20-30mmHg; we will apply compression stockings next week. He continues to smoke, smoked "2 cigarettes" last week 06/20/17 on evaluation today patient did receive his compression stockings which he has with him today for both lower extremities. With that being said he tells me at this point in time that he is very happy to have these he really is not a big fan of the compression wraps that we have been utilizing although they have been of great benefit for him. Nonetheless at this point he does want to switch to the compression stockings. In my opinion as long as he is continuing with compression I'm okay with the stockings or the wraps. 06/27/17-he is here in follow-up evaluation for multiple ulcerations to his bilateral toes. There is some improvement in appearance. He is compliant  in wearing his compression stockings with significant improvement in lower extremity edema. He saw Dr Ola Spurr on 3/8, per his notes they would redraw ESR and CRP; plan to continue antibiotic therapy if these remain elevated. I do not see an ESR or CRP level in Epic. The patient continues to take antibiotics. 07/11/17-He is here in follow-up evaluation for multiple ulcerations to multiple toes bilaterally. He presents with complete epithelialization to the right third toe; there has been no deterioration. He continues on antibiotic therapy. He will follow-up next week 07/18/17-He is here in follow-up evaluation for multiple ulcerations to multiple toes bilaterally. He  continues to make improvement. He continues on antibiotic therapy. He states he has been using something additional to our orders, he does not elaborate but states he will bring it in next week. 07/25/17-He is here in follow up evaluation for multiple ulcerations to bilateral toes. He is stable. He has completed antibiotic therapy. He admits to "filing" his toes after showers each evening, this is what he was referencing last week; he does not filing for the wounds. We will switch to [facility] and monitor for any improvement, he will follow-up next week 08/01/17-He is here in follow-up evaluation. He admits to "picking" at his toes after cleansing yesterday, leading to new areas of tissue loss on the bilateral second toe. There is improvement noted to the bilateral great toe. We will dress toes today and hope that they maintain until Monday where he will come in for a nurse visit. He has been advised, multiple times with expressed verbalization, to change the dressings to silvercel if the dressings get wet prior to [facility] appointment. 08/08/17-He is here in follow-up evaluation for bilateral first and second toe ulcerations. There is significant improvement to all ulcerations since last visit. We will switch to [name] to all wounds and he will continue with nurse visits on a Monday/Thursday schedule and follow-up with me in 2 weeks. He continues to smoke, 1-3 cigarettes per day, and has been encouraged to not smoke until his next follow-up in 2 weeks. 08/15/17 on evaluation today patient's ulcers on his toes actually appear to be doing fairly well the right to ulcers may be a little bit more moist compared to the left I'm not really sure exactly why as the openings appear to be very small and I'm not seeing any evidence of anything significant as far as you lightest or otherwise. Nonetheless this may just be a small setback he's Baskett, Braian E. ([id number]) been doing very well with the  [facility] Dressing 08/22/17-He is here in follow-up evaluation for ulcerations to his bilateral first and second toes, there is small/scant amount of drainage noted on today's dressing. He continues with Hydrofera Blue. He continues to smoke, 1 cigarette a day. Voices no complaint or concerns, compliant and compression therapy today. He'll follow-up next week 08/29/17-He is here in follow-up evaluation for ulcerations to his bilateral first and second toes, with a new wound to the left third toe. He states he cut himself while cutting the toenail. He presents today with more maceration and increased measurements; waxing and waning measurements and moisture has been an ongoing issue. He continues to smoke a proximally 1 cigarette per day, but states he has not smoked since Sunday. We will initiate medihoney daily and evaluate next week. He presents today without compression stockings 09/05/17-He is here in follow-up evaluation for ulcerations to bilateral first and second and left third toe. He did not pick up the  medihoney and therefore has not been changing his dressing. He admits to inconsistent where of compression stockings, admits to pain to his bilateral lower extremities with unilateral edema (right greater than left). The ulcerations to multiple toes are more macerated and larger in size than last week. He is wearing compression therapy today. He admits to an overall feeling of weakness and fatigue and has been encouraged to contact his PCP for evaluation; he has a known history of anemia and intraabdominal lymphadenopathy. We will return to silvercel and he will follow up next week 09/19/17-He is here in follow-up evaluation for ulcerations to bilateral first and second toe.he was unable to make last week's appointment. The culture that was obtained on 5/30 grew clindamycin sensitive MRSA; he was initiated on 6/4 but did not start it until 6/7. There is significant improvement in all  wounds, healing to left second and third toe. He also admits to having no alcohol or smoking for the last 6 days. We will continue with same treatment plan I will extend the clindamycin for an additional 10 days and he will follow-up next week. 09/26/17- He is here in follow evaluation for multiple ulcerations to multiple toes bilaterally. He continues to be non-compliant with dressing, compression therapy. He states he is taking the antibiotics as prescribed, although his recall is inconsistent. There is noted deterioration in all ulcers. We will continue same treatment plan, antibiotic therapy and he will follow up next week. 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 This information is obtained from the medical record, as he cannot articulate specifics to his diagnosis or treatment plan. 10/17/17-He is here in follow-up evaluation for multiple ulcerations to multiple toes bilaterally. There is chronic waxing and waning improvement/deterioration. On today's exam they appear improved, dry without maceration. He continues to apply a variety of topical agents, stating that he has switched between the blue product and silver product. In my opinion, this will be a chronic waxing and waning giving his intermittent compliance. He will follow-up in 2 weeks. As it relates to the biopsy taken from his buttock, he has not followed up with that and has not started radiation therapy. 10/31/17-He is here for evaluation for multiple ulcerations to multiple toes bilaterally. Continues with waxing and waning improvement versus deterioration. They are stable on today's exam without significant maceration. 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 We will continue with every 2 week follow-up appointments 11/14/17- He  is seen in follow-up evaluation for multiple ulcerations to multiple bilateral toes. He is scheduled for excision of basal cell carcinoma to his right buttock on Monday 8/12. He continues to have chronic waxing and waning of his ulcers; today is a stable day with minimal drainage and left ear is better than right toes. I will recount to infectious disease regarding chronic suppressive therapy. He admits to being more compliant with compression stockings, although he is not wearing any today. He has been encouraged to continue with dressing changes as ordered and compression therapy daily. He will be seen in 2 weeks 11/28/17-He is seen in follow up evaluation for multiple ulcerations to multiple toes bilaterally. He had an overnight admission at [facility] (8/11-8/12) for symptomatic anemia, received two units prbc. His surgical date has been moved to 9/94 excision of basal cell carcinoma to  the right buttock. He has been noncompliant in wearing compression stockings, applying topical treatment as ordered; he has been applying a "cream", Hydrofera Blue, silvercel. He is currently on no antibiotic therapy; we will send for re-referral to ID for suppressive therapy given his multiple co-morbidities, poor surgical candidate, unwilling to have toe amputation and general non-adherence. 12/19/17 on evaluation today patient actually appears to be doing very well in regard to his toes at least compared to last time I saw him. He again has been coming to our office for quite a bit of time. 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 He states is hurting a little bit but doing well in general. I'm see were not taking care of this area dermatology is. He does have his appointment with infectious disease upcoming which they re-consult for suppressive therapy that's next Monday. 01/02/18 on evaluation  today patient actually appears to be doing rather well in regard to his toe ulcers although he tells me he has been put in an ointment on this which I am concerned may be causing some additional moisture buildup which we really do not want. He supposed be using silver cell which is mainly thing I want him to be utilizing. Fortunately there does not appear to be any evidence of worsening infection which is good news. He has had surgery on his gluteal region where he had an excision of a cancerous area fortunately that seems to be doing well although he is having some discomfort. 02/06/18 on evaluation today patient actually appears to be doing well at all locations except for his second toe use bilaterally Sinor, Dunbar E. ([id number]) where the distal tip is still wrong open. He did state that he can't stand for dry skin to buildup on his toes subsequently as a question and further it sounds as if he actually pills this all which is likely keeping the toe is wrong and open. Also think that's what happened in the bottom of his foot although that appears to have healed at this point. There does not appear to be any evidence of infection. 02/20/18 on evaluation today patient appears to be doing better in regard to his foot ulcers. Everything has shown signs of improvement which is excellent news. Overall I'm very pleased with how things appear currently. The patient states he's not picking her point at any skin and that he wears shoes at all times when he is ambulating at home. Both are things that I've advocated and recommended for him in the past. 03/13/18 on evaluation today patient actually appears to be doing very well in regard to the bilateral toes in the overall appearance of the wound bed locations. Fortunately there does not appear to be any signs of infection at this time. He states that he is very pleased with how things seem to be progressing to be honest as am I. 03/27/1899 evaluation today  patient appears to be doing well in regard to his toe ulcers. He just has a few areas remaining and these appear to be showing signs of improvement as well which is great news. Overall I have been impressed with the progress of the past several weeks. 04/10/18 on evaluation today patient appears to be doing a little bit worse in regard to the right plantar foot at the regional the first metatarsal as well as the left second toe. He tells me that  he ran out of bandages he also tells me he's been wearing his flip-flops a lot at home as well as a new pair of boots that he received as well. This is as opposed to the postop shoes that we had previously given him. Nonetheless overall I feel like that he has had a little bit more friction to the area which is causing things to look a little bit worse today. 04/24/18 on evaluation today patient's wounds for the most part appear to be doing excellent especially in regard to the bilateral second toes. With that being said he does have issues with the wound still on the first metatarsal region and first toe of the right foot which have not completely closed at this point. Nonetheless I do feel like that he is making progress which is good news. 05/08/18 on evaluation today patient's wounds appear to be doing just a little bit worse than normal simply due to the fact that he has not had dressing supplies. He apparently owed $35 in order for them to be shipped and unfortunately is not able to pay that currently. We did give him information for the [place] today which will hopefully help cover his dressing supplies. He was thankful for this he states is gonna contact them this afternoon. Fortunately otherwise or does not hear any signs of infection. 05/22/18 on evaluation today patient appears to be doing somewhat poorly in regard to his toes. Upon further questioning it appears that he actually has been pulling skin off of the toes which "drive  him crazy". Subsequently as he does this he calls his new areas to tear open and I think this is one of the reasons why his toes are not healing as appropriately as we would like. He fortunately has no signs of infection at this time. With that being said I'm still concerned about the possibility of a fungal infection even though there doesn't appear to be anything bacterial. Potentially nystatin powder could be of benefit for him as far as trying to help this in that regard. 06/05/18 on evaluation today patient actually appears to be doing very well in regard to his foot ulcers. Fortunately there's no signs of infection. He's been tolerating the dressing changes without complication. I'm very pleased with how things seem to be progressing. Fortunately there's no evidence of active infection at this point I do believe nystatin powder has been of benefit for him. 06/19/18 on evaluation today patient actually appears to be doing a little worse compared to last evaluation. Tells me that he has not had any supplies for two weeks. With that being said his plantar foot wounds on the right actually appear to be doing very well. It's actually a second toes in the first toes that are the worst. I think these are areas the was able to get to and pick and he tells me in fact that he destine to pick out some of the dry skin because it causes him annoyance. Nonetheless I think this is somewhat detrimental to him I think it's worth keeping part of the wound open in particular. Fortunately there is no sign of infection at this time although I think that is a risk which I discussed specifically that he's not careful you can end up reopening this and causing greater problems for himself. 07/03/18 on evaluation today patient's wound bed actually shows evidence of good granulation at this time and epithelialization as far as the wounds in general are concerned. He  has been tolerating the dressing changes including the  nystatin without complication. This is good news. With that being said he seems to be still having some trouble with the left second toe distally at this point. 4/9; most of the wounds look quite satisfactory. He requires debridement of the plantar right great toe otherwise everything Vanvorst, Kaeson E. ([id number]) looks superficial to improved. His left second toe is healed. 08/14/18 on evaluation today patient actually appears to be doing very well in regard to his foot ulcerations. In fact he just has three small areas of the right foot still remaining open the left foot is closed and dry at this point. Overall I feel like he is doing quite well which is excellent news. Fortunately there's no signs of active infection. 08/28/18 on evaluation today patient's toes of the right foot actually shows signs of being a little bit worse in regard to the overall open areas at this point. He does tell me that he "washes them well" when he is taking a shower. Again I explained that I want to make sure he's not washing them to aggressively as this could potentially cause issues with overall worsening. 09/12/18 on evaluation today patient actually appears to be doing well in regard to his right foot ulcers. He's been tolerating the dressing changes without complication. Fortunately there's no signs of active infection at this time. With that being said he does have a new one on the dorsal surface of his foot just proximal to his toes he does not know really where this came from. 09/25/18 on evaluation today patient actually appears to be doing somewhat better in regard to his right foot ulcers although he has a new opening on the left second toe unfortunately. This is something that seems to be back and forth he tells me did Pickett scan at this area that is why it's now open yet again. No fevers, chills, nausea, or vomiting noted at this time. 10/16/18 on evaluation today patient actually appears to be doing decently  poorly in regard to his ulcers on his feet. He tells me that for the past 3-4 days he is not having to take therefore is not been putting on any dressings he came in with the socks actually just stuck to the toes with a been draining. He also has not picked up the nystatin powder that was sent to the pharmacy with refills all he has to do is go pick that up he has not done so. He also told me for the more that he feels like his "blood is low" which seems to be referring to his hemoglobin he [facility] and subsequently states he needs to go there when he leaves here in order to see about a blood transfusion. Nonetheless I think that's something that he definitely needs to get on top of if he feels like that's necessary. Fortunately there's no signs of active infection at this time. Electronic Signature(s) Signed: 10/19/2018 4:13:38 PM By: Worthy Keeler PA-C Entered By: Worthy Keeler on 10/16/2018 09:47:37 [place], [name] ([id number]) -------------------------------------------------------------------------------- Physical Exam Details Patient Name: Bunda, Terek E. Date of Service: 10/16/2018 9:00 AM Medical Record Number: [id number] Patient Account Number: [id number] Date of Birth/Sex: 09/25/1950 (68 y.o. M) Treating RN: Army Melia Primary Care Provider: Lamonte Sakai Other Clinician: Referring Provider: Lamonte Sakai Treating Provider/Extender: Melburn Hake, HOYT Weeks in Treatment: 6 Constitutional Well-nourished and well-hydrated in no acute distress. Respiratory normal breathing without  difficulty. clear to auscultation bilaterally. Cardiovascular regular rate and rhythm with normal S1, S2. Psychiatric this patient is able to make decisions and demonstrates good insight into disease process. Alert and Oriented x 3. pleasant and cooperative. Electronic Signature(s) Signed: 10/19/2018 4:13:38 PM By: Worthy Keeler PA-C Entered By: Worthy Keeler  on 10/16/2018 09:36:55 Sidle, [name] ([id number]) -------------------------------------------------------------------------------- Physician Orders Details Patient Name: Pompei, Jacob E. Date of Service: 10/16/2018 9:00 AM Medical Record Number: [id number] Patient Account Number: [id number] Date of Birth/Sex: 10/20/1950 (68 y.o. M) Treating RN: Army Melia Primary Care Provider: Lamonte Sakai Other Clinician: Referring Provider: Lamonte Sakai Treating Provider/Extender: Melburn Hake, HOYT Weeks in Treatment: 35 Verbal / Phone Orders: No Diagnosis Coding ICD-10 Coding Code Description L97.521 Non-pressure chronic ulcer of other part of left foot limited to breakdown of skin L97.511 Non-pressure chronic ulcer of other part of right foot limited to breakdown of skin E11.621 Type 2 diabetes mellitus with foot ulcer I87.323 Chronic venous hypertension (idiopathic) with inflammation of bilateral lower extremity I89.0 Lymphedema, not elsewhere classified F17.218 Nicotine dependence, cigarettes, 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 abuse 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, right ankle and foot Wound Cleansing Wound #16 Right,Plantar Toe Great o Clean wound with Normal Saline. Wound #18 Right,Dorsal Toe Great o Clean wound with Normal Saline. Wound #2 Right Toe Second o Clean wound with Normal Saline. Wound #20 Left Toe Second o Clean wound with Normal Saline. Wound #21 Left Toe Great o Clean wound with Normal Saline. Wound #22 Right,Distal,Dorsal Foot o Clean wound with Normal Saline. Anesthetic (add to Medication List) Wound #16 Right,Plantar Toe Great o Topical Lidocaine 4% cream applied to wound bed prior to debridement (In Clinic Only). Wound #18 Right,Dorsal Toe Great o Topical Lidocaine 4% cream applied to wound bed prior to debridement (In Clinic Only). Wound #2 Right Toe Second o Topical Lidocaine  4% cream applied to wound bed prior to debridement (In Clinic Only). Wound #20 Left Toe Second o Topical Lidocaine 4% cream applied to wound bed prior to debridement (In Clinic Only). OTHNIEL, MARET ([id number]) Wound #21 Left Toe Great o Topical Lidocaine 4% cream applied to wound bed prior to debridement (In Clinic Only). Wound #22 Right,Distal,Dorsal Foot o Topical Lidocaine 4% cream applied to wound bed prior to debridement (In Clinic Only). Skin Barriers/Peri-Wound Care Wound #16 Right,Plantar Toe Great o Antifungal powder-Nystatin Wound #18 Right,Dorsal Toe Great o Antifungal powder-Nystatin Wound #2 Right Toe Second o Antifungal powder-Nystatin Wound #20 Left Toe Second o Antifungal powder-Nystatin Wound #21 Left Toe Great o Antifungal powder-Nystatin Wound #22 Right,Distal,Dorsal Foot o Antifungal powder-Nystatin Primary Wound Dressing Wound #16 Right,Plantar Toe Great o Silver Alginate - secure with tape Wound #18 Right,Dorsal Toe Great o Silver Alginate - secure with tape Wound #2 Right Toe Second o Silver Alginate - secure with tape Wound #20 Left Toe Second o Silver Alginate - secure with tape Wound #21 Left Toe Great o Silver Alginate - secure with tape Wound #22 Right,Distal,Dorsal Foot o Silver Alginate - secure with tape Dressing Change Frequency Wound #16 Right,Plantar Toe Great o Change dressing every other day. Wound #18 Right,Dorsal Toe Great o Change dressing every other day. Wound #2 Right Toe Second o Change dressing every other day. Wound #20 Left Toe Second Schomer, Kobie E. ([id number]) o Change dressing every other day. Wound #21 Left Toe Great o Change dressing every other day. Wound #22 Right,Distal,Dorsal Foot o Change dressing every other day. Follow-up Appointments Wound #16 Right,Plantar  Toe Great o Return Appointment in 2 weeks. Wound #18 Right,Dorsal Toe Great o Return Appointment in 2  weeks. Wound #2 Right Toe Second o Return Appointment in 2 weeks. Wound #20 Left Toe Second o Return Appointment in 2 weeks. Wound #21 Left Toe Great o Return Appointment in 2 weeks. Wound #22 Right,Distal,Dorsal Foot o Return Appointment in 2 weeks. Edema Control Wound #16 Right,Plantar Toe Great o Patient to wear own compression stockings Wound #18 Right,Dorsal Toe Great o Patient to wear own compression stockings Wound #2 Right Toe Second o Patient to wear own compression stockings Wound #20 Left Toe Second o Patient to wear own compression stockings Wound #21 Left Toe Great o Patient to wear own compression stockings Wound #22 Right,Distal,Dorsal Foot o Patient to wear own compression stockings Additional Orders / Instructions o Stop Smoking o Increase protein intake. Medications-please add to medication list. Wound #16 Right,Plantar Toe Great o Other: - Nystatin Powder Wound #18 Right,Dorsal Toe Great o Other: - Nystatin Powder Prowell, Staton E. ([id number]) Wound #2 Right Toe Second o Other: - Nystatin Powder Wound #20 Left Toe Second o Other: - Nystatin Powder Wound #21 Left Toe Great o Other: - Nystatin Powder Wound #22 Right,Distal,Dorsal Foot o Other: - Nystatin Powder Electronic Signature(s) Signed: 10/19/2018 4:13:38 PM By: Worthy Keeler PA-C Entered By: Worthy Keeler on 10/16/2018 09:39:10 Hudlow, [name] ([id number]) -------------------------------------------------------------------------------- Problem List Details Patient Name: Vrba, Rodolfo E. Date of Service: 10/16/2018 9:00 AM Medical Record Number: [id number] Patient Account Number: [id number] Date of Birth/Sex: May 16, 1950 (68 y.o. M) Treating RN: Army Melia Primary Care Provider: Lamonte Sakai Other Clinician: Referring Provider: Lamonte Sakai Treating Provider/Extender: Melburn Hake, HOYT Weeks in Treatment: 107 Active Problems ICD-10 Evaluated Encounter Code  Description Active Date Today Diagnosis L97.521 Non-pressure chronic ulcer of other part of left foot limited to 09/21/2016 No Yes breakdown of skin L97.511 Non-pressure chronic ulcer of other part of right foot limited to 09/21/2016 No Yes breakdown of skin E11.621 Type 2 diabetes mellitus with foot ulcer 09/21/2016 No Yes I87.323 Chronic venous hypertension (idiopathic) with inflammation of 09/21/2016 No Yes bilateral lower extremity I89.0 Lymphedema, not elsewhere classified 09/21/2016 No Yes F17.218 Nicotine dependence, cigarettes, with other nicotine-induced 09/21/2016 No Yes disorders F10.19 Alcohol abuse with unspecified alcohol-induced disorder 09/21/2016 No Yes M86.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, right ankle and foot 02/21/2017 No Yes Inactive Problems Resolved Problems Kuper, ADI DORO ([id number]) Electronic Signature(s) Signed: 10/19/2018 4:13:38 PM By: Worthy Keeler PA-C Entered By: Worthy Keeler on 10/16/2018 09:32:18 Grizzle, [name] ([id number]) -------------------------------------------------------------------------------- Progress Note Details Patient Name: Bushway, Jatorian E. Date of Service: 10/16/2018 9:00 AM Medical Record Number: [id number] Patient Account Number: [id number] Date of Birth/Sex: 1951/01/17 (68 y.o. M) Treating RN: Army Melia Primary Care Provider: Lamonte Sakai Other Clinician: Referring Provider: Lamonte Sakai Treating Provider/Extender: Melburn Hake, HOYT Weeks in Treatment: 107 Subjective Chief Complaint Information obtained from Patient Patient presents for treatment of an open diabetic ulcer to both feet History of Present Illness (HPI) 68 year old patient here to see as for bilateral feet ulceration to on his left first and second toe and 2 on his right first and second toe, which she's had for about 4 months. 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 Donella Stade. The patient is also undergoing workup by medical oncology for a intra-abdominal lymphadenopathy. 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 He is also status post appendectomy, inguinal lymph node biopsy, rectal biopsy and rectal examination under anesthesia. he currently smokes cigarettes about half packet a day. In March of this year he was seen by Dr. Hortencia Pilar, for evaluation of bilateral varicose veins and besides wearing compression stockings he had recommended laser ablation of the right and left great saphenous veins to eleviate the symptoms and complications of severe superficial venous reflux disease. He also recommended lymphedema pumps for better control of his lymphedema. The patient recently has had on 08/23/2016, right greater saphenous vein ablation with the laser energy Earlier lower extremity venous reflux examination done on 05/08/2016 showed no DVT or SVT both lower legs but incompetence of bilateral great saphenous veins was present. A lower arterial study was also done and there was no significant right lower and left lower extremity problems based on a normal toe brachial index bilaterally and the ABI was 1.21 the left and 1.23 on the right. His post ablation venous duplex examination showed successful ablation of the right GS vein with thrombus formation 2 below the right saphenofemoral junction. The deep system was patent without evidence of thrombosis and this was done on 08/30/2016. 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 Addendum: regarding his x-rays done today and x-ray of the left foot -- IMPRESSION: No objective evidence of osteomyelitis. There are soft tissue changes which may reflect  cellulitis. X-ray of the right foot -- IMPRESSION:Findings compatible with cellulitis of the toes. No objective evidence of osteomyelitis is observed. 10/01/16 on evaluation today patient's wounds appeared to be doing some better. I did review the x-rays as well which showed no evidence of osteomyelitis although there was evidence on x-ray of cellulitis. He fortunately is not having any discomfort although he continues to have some swelling. He does not remember being on any antibiotics recently. 10/15/16 on evaluation today patient's wounds overall appear to be doing better although he does have a new location noted on the left foot. Fortunately he is not having significant pain. It almost has the appearance that something is rubbing on the end of his toes but he wears the open toe shoes and according to what he is telling me never wears anything that would rub on his foot. There is no evidence of infection and specifically no evidence of a fungal infection 10/22/16 On evaluation today patient's wounds appeared to be doing better compared to last week in regard to his bilateral lower extremities. Fortunately I happy with how things are progressing although he still has ulcers I feel like that he is improving and appropriate manner. MACALLISTER, ASHMEAD ([id number]) 11/12/16 on evaluation today patient appears to be doing well in regard to his bilateral feet and the respective wounds. We have been using surrounding her dressings along with an antifungal cream which seems to be doing very well. He has no bilateral dysfunction noticed that the rituals are weight loss at this point. He also has no nausea or vomiting a note purulent discharge. He did see Vein and vascular today and he tells me that they told him he could have surgery for his venous stasis but they did not feel like it was worth it in his words. Fortunately patient's wounds do appear to be getting sneakily better. 11/26/2016 -- he  says he is going  to have some surgery during this week at [facility] for possibly a colon resection. 12/31/2016 -- the patient has been noncompliant with his smoking and I'm not sure whether he is also started drinking again. He continues to be very nonchalant about his care 01/14/2017 -- the patient's HandP has been reviewed well and I understand he is being compliant with trying to give up smoking and his local dressing changes. He does not have any surgical options of 4 to him by his vascular surgeons.he was last seen in early August by Dr. Hortencia Pilar who recommended compression stockings,and possibly lymph pumps in 2-3 months after doing a review ultrasound. 01/28/2017 - the patient did not have any fresh complaints but on examination I noted a large lacerated wound on the plantar aspect of his right fourth toe which had a lot of necrotic debris and it probes down to bone. 02/07/2017 -- x-ray of the right foot -- IMPRESSION: Soft tissue swelling about the first through fourth toes consistent with cellulitis. New destructive change in the tuft of the distal phalanx of the great toe is consistent with osteomyelitis. 02/14/2017 -- the patient's MRI is pending this coming Monday and he still continues to smoke. We have again gone over off loading of his wounds in great detail and he says he's been compliant. 02/21/2017 -- MR of the right foot -- IMPRESSION: 1. Soft tissue ulcer at the tip of the first, second and third toe knows. Cortical irregularity and bone marrow edema in the first distal phalanx most concerning for osteomyelitis. Mild marrow edema in the second and third distal phalanx without definite cortical destruction which may reflect early osteomyelitis versus reactive marrow edema. 2. Soft tissue edema surrounding the first phalanx most consistent with cellulitis. the patient was also recently evaluated by his medical oncologist Dr. 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 She is treating him with weekly Procrit. She is also keeping intra-abdominal lymphadenopathy and right lower lobe lung nodule under observation. 04/04/2017 -- he was seen by Dr. Adrian Prows on 03/25/2017 -- after review he empirically put him on ciprofloxacin and doxycycline as they have good bone penetration and good bioavailability and it will cover the usual pathogens and diabetic foot osteomyelitis. He will check inflammatory markers and plan a 51-59 week old records. C-reactive protein was 0.3 and the ESR was 72 04/18/17 on evaluation today patient appears to be doing about the same in regard to his lower extremity wounds bilaterally. He has continued to use the antifungal cream which does seem to be beneficial. Nonetheless the ulcers do seem to in some areas be epithelial eyes over and in other areas are still open. He is having no significant discomfort. 04/25/17-he is here in follow-up evaluation for multiple ulcerations to multiple toes bilaterally. He states he did see Dr. Ola Spurr again last week and continues antibiotic therapy. He is voicing no complaints or concerns, will continue with current treatment plan will possibility of adding compression therapy next week after an additional week of treatment/lotions to BLE prescribed by Dr Ola Spurr 05/02/17 he is here in follow up for for multiple ulcers to multiple toes bilaterally. we will stop using antifungal cream and will continue with silvercel and follow up next week 05/09/17-he is here in follow-up for multiple ulcerations to multiple toes bilaterally. There is improvement in appearance. He has not completely stopped using antifungal cream, but admits he  has not using it between the toes. He has an appointment with Dr. Ola Spurr on 2/11, continues on doxycycline and Cipro. It has been 5 weeks of antibiotic therapy, we will order plain film xray to evaluate for osteomyelitis  next week, prior to follow up with ID. Will continue with silvercel and follow up next week 05/16/17-he is here in follow-up evaluation for multiple ulcerations to multiple toes bilaterally and new wound to the right posterior heel. There is essentially no change in appearance, deteriorating measurements; he has a history of waxing and waning measurements. He admits that he continues to apply moisturizer/cream/ointment to his toes despite weekly reminders to only apply silvercel to his toes. He states that he thinks the surgical shoe contributed to the superficial ulcer to his posterior heel, he is unable to articulate if this was an area of dry cracked skin as he has a similar area to the left heel. He now is wearing open toed slippers. He has an appointment with Dr. Ola Spurr on 2/11. We have ordered x-rays for her bilateral feet; Tessmer, Damarko E. ([id number]) he was advised to obtain the x-rays today or tomorrow. He will follow-up next week 05/23/17-he is here in follow-up evaluation for multiple ulcerations to multiple toes bilaterally and the right posterior heel. There is improvement in maceration. He has been compliant and not applying any moisturizing agent to his toes. He has been using Lac-Hydrin for his lower extremities with improvement. He did not go to his appointment on Monday with Dr. Ola Spurr secondary to financial concerns. X-rays for her bilateral feet showed: LEFT FOOT with slight erosion of the tuft of the distal phalanges of the left first and second toe suspicious for osteomyelitis, RIGHT FOOT with 1.erosion of the tufts of the distal phalanges of the right first second and possibly third toes consistent with osteomyelitis, 2 no definitive abnormality of the calcaneus is seen on the images obtained, 3. Plantar calcaneal degenerative spur. We briefly discussed hyperbaric adjunctive therapy for treatment of chronic refractory osteomyelitis. I do not find an a1c in EMR, will contact  PCP for record, or order if needed. He has been encouraged to contact Dr Ola Spurr office regarding the follow-up appointment, encouraged him to inquire about payment plan. We will continue with same treatment plan and follow-up next week. He states he is still taking antibiotics and has "a lot" left. He states he has been taking them as directed, 2 pills twice daily. According to Dr. Blane Ohara office notes he was originally started on 12/17 for 4 weeks and extended on 1/14 for an additional 4 weeks. He should be done with his antibiotic therapy, he was advised to bring his bottles and to his next appointment, we will contact pharmacy. 05/30/17-he is here in follow-up evaluation for multiple ulcerations to multiple toes bilaterally and the right posterior heel. He is accompanied by his brother-in-law. Wounds are stable. He has yet to make up with Dr. Ola Spurr. We contacted his PCP, with no record of recent A1c we will draw an A1c. His brother-in-law states that he was taken off all of his diabetic medication secondary to kidney function. 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 The antibiotics were counted and have approximately 2 weeks left, although they should be complete. The pill organizer reveals missing days. We discussed the need for consistent medications, to have optimal benefit of medication. He has a cousin that lives with him and he  will ask her to check his organizer daily. He has been advised to follow up with Dr Ola Spurr, and will go by the office today. He has been advised to quit smoking. 06/06/17-he is here in follow up evaluation. He has had to make an appointment with Dr. Ola Spurr. He did have blood work obtained, a1c 5. He continues to take antibiotic therapy. Significant improvement in bilateral lower extremity edema with compression therapy. Essentially no change in  ulcerations to toes. He states he is "going to try something different" and "let me know next week" if it works; he would not provide any additional information and was encouraged to follow our orders. We will follow up next week 06/13/17-he is here in follow-up evaluation. He has an appointment with Dr. Ola Spurr tomorrow morning. He states he purchased an ointment from [facility] and applied to his toes for 3 days, he does not remember the name of the ointment. There is improvement to his wounds, minimal maceration. He continues to take antibiotic therapy, this should have been completed last month. His brother-in-law who regularly accompanies his appointments was asked to take the bottles to the appointment tomorrow with Dr. Ola Spurr so he is aware. We will continue with 3 layer compression, and order OPEN TOE compression 20-30mmHg; we will apply compression stockings next week. He continues to smoke, smoked "2 cigarettes" last week 06/20/17 on evaluation today patient did receive his compression stockings which he has with him today for both lower extremities. With that being said he tells me at this point in time that he is very happy to have these he really is not a big fan of the compression wraps that we have been utilizing although they have been of great benefit for him. Nonetheless at this point he does want to switch to the compression stockings. In my opinion as long as he is continuing with compression I'm okay with the stockings or the wraps. 06/27/17-he is here in follow-up evaluation for multiple ulcerations to his bilateral toes. There is some improvement in appearance. He is compliant in wearing his compression stockings with significant improvement in lower extremity edema. He saw Dr Ola Spurr on 3/8, per his notes they would redraw ESR and CRP; plan to continue antibiotic therapy if these remain elevated. I do not see an ESR or CRP level in Epic. The patient continues to take  antibiotics. 07/11/17-He is here in follow-up evaluation for multiple ulcerations to multiple toes bilaterally. He presents with complete epithelialization to the right third toe; there has been no deterioration. He continues on antibiotic therapy. He will follow-up next week 07/18/17-He is here in follow-up evaluation for multiple ulcerations to multiple toes bilaterally. He continues to make improvement. He continues on antibiotic therapy. He states he has been using something additional to our orders, he does not elaborate but states he will bring it in next week. 07/25/17-He is here in follow up evaluation for multiple ulcerations to bilateral toes. He is stable. He has completed antibiotic therapy. He admits to "filing" his toes after showers each evening, this is what he was referencing last week; he does not filing for the wounds. We will switch to [facility] and monitor for any improvement, he will follow-up next week 08/01/17-He is here in follow-up evaluation. He admits to "picking" at his toes after cleansing yesterday, leading to new areas of tissue loss on the bilateral second toe. There is improvement noted to the bilateral great toe. We will dress toes today and hope that they maintain  until Monday where he will come in for a nurse visit. He has been advised, multiple times with expressed verbalization, to change the dressings to silvercel if the dressings get wet prior to [facility] appointment. 08/08/17-He is here in follow-up evaluation for bilateral first and second toe ulcerations. There is significant improvement to all ulcerations since last visit. We will switch to [facility] to all wounds and he will continue with nurse visits on a Wehrly, Marcellas E. ([id number]) Monday/Thursday schedule and follow-up with me in 2 weeks. He continues to smoke, 1-3 cigarettes per day, and has been encouraged to not smoke until his next follow-up in 2 weeks. 08/15/17 on evaluation today patient's  ulcers on his toes actually appear to be doing fairly well the right to ulcers may be a little bit more moist compared to the left I'm not really sure exactly why as the openings appear to be very small and I'm not seeing any evidence of anything significant as far as you lightest or otherwise. Nonetheless this may just be a small setback he's been doing very well with the [facility] Dressing 08/22/17-He is here in follow-up evaluation for ulcerations to his bilateral first and second toes, there is small/scant amount of drainage noted on today's dressing. He continues with Hydrofera Blue. He continues to smoke, 1 cigarette a day. Voices no complaint or concerns, compliant and compression therapy today. He'll follow-up next week 08/29/17-He is here in follow-up evaluation for ulcerations to his bilateral first and second toes, with a new wound to the left third toe. He states he cut himself while cutting the toenail. He presents today with more maceration and increased measurements; waxing and waning measurements and moisture has been an ongoing issue. He continues to smoke a proximally 1 cigarette per day, but states he has not smoked since Sunday. We will initiate medihoney daily and evaluate next week. He presents today without compression stockings 09/05/17-He is here in follow-up evaluation for ulcerations to bilateral first and second and left third toe. He did not pick up the medihoney and therefore has not been changing his dressing. He admits to inconsistent where of compression stockings, admits to pain to his bilateral lower extremities with unilateral edema (right greater than left). The ulcerations to multiple toes are more macerated and larger in size than last week. He is wearing compression therapy today. He admits to an overall feeling of weakness and fatigue and has been encouraged to contact his PCP for evaluation; he has a known history of anemia and intraabdominal  lymphadenopathy. We will return to silvercel and he will follow up next week 09/19/17-He is here in follow-up evaluation for ulcerations to bilateral first and second toe.he was unable to make last week's appointment. The culture that was obtained on 5/30 grew clindamycin sensitive MRSA; he was initiated on 6/4 but did not start it until 6/7. There is significant improvement in all wounds, healing to left second and third toe. He also admits to having no alcohol or smoking for the last 6 days. We will continue with same treatment plan I will extend the clindamycin for an additional 10 days and he will follow-up next week. 09/26/17- He is here in follow evaluation for multiple ulcerations to multiple toes bilaterally. He continues to be non-compliant with dressing, compression therapy. He states he is taking the antibiotics as prescribed, although his recall is inconsistent. There is noted deterioration in all ulcers. We will continue same treatment plan, antibiotic therapy and he will follow up  next week. 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 This information is obtained from the medical record, as he cannot articulate specifics to his diagnosis or treatment plan. 10/17/17-He is here in follow-up evaluation for multiple ulcerations to multiple toes bilaterally. There is chronic waxing and waning improvement/deterioration. On today's exam they appear improved, dry without maceration. He continues to apply a variety of topical agents, stating that he has switched between the blue product and silver product. In my opinion, this will be a chronic waxing and waning giving his intermittent compliance. He will follow-up in 2 weeks. As it relates to the biopsy taken from his buttock, he has not followed up with that and has not  started radiation therapy. 10/31/17-He is here for evaluation for multiple ulcerations to multiple toes bilaterally. Continues with waxing and waning improvement versus deterioration. They are stable on today's exam without significant maceration. 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 We will continue with every 2 week follow-up appointments 11/14/17- He is seen in follow-up evaluation for multiple ulcerations to multiple bilateral toes. He is scheduled for excision of basal cell carcinoma to his right buttock on Monday 8/12. He continues to have chronic waxing and waning of his ulcers; today is a stable day with minimal drainage and left ear is better than right toes. I will recount to infectious disease regarding chronic suppressive therapy. He admits to being more compliant with compression stockings, although he is not wearing any today. He has been encouraged to continue with dressing changes as ordered and compression therapy daily. He will be seen in 2 weeks 11/28/17-He is seen in follow up evaluation for multiple ulcerations to multiple toes bilaterally. He had an overnight admission at [facility] (8/11-8/12) for symptomatic anemia, received two units prbc. His surgical date has been moved to 9/94 excision of basal cell carcinoma to the right buttock. He has been noncompliant in wearing compression stockings, applying topical treatment as ordered; he has been applying a "cream", Hydrofera Blue, silvercel. He is currently on no antibiotic therapy; we will send for re-referral to ID for suppressive therapy given his multiple co-morbidities, poor surgical candidate, unwilling to have toe amputation and general non-adherence. 12/19/17 on evaluation today patient actually appears to be doing very well in regard to his toes at least compared to last time I saw him. He again has been coming to our office for quite a bit of time. With that being said he did have  surgical excision of what appears to have been according to records a basal sale carcinoma which was removed 12/16/17. He states is hurting a little bit but doing well in general. I'm see were not taking care of this area dermatology is. He does have his appointment with infectious disease upcoming which they re-consult for suppressive therapy that's next Monday. 01/02/18 on evaluation today patient actually appears to be doing rather well in regard to his toe ulcers although he tells me he Clyne, Lequan E. ([id number]) has been put in an ointment on this which I am concerned may be causing some additional moisture buildup which we really do not want. He supposed be using silver cell which is mainly thing I want him to be utilizing. Fortunately there does not appear to be any evidence of worsening infection which is good  news. He has had surgery on his gluteal region where he had an excision of a cancerous area fortunately that seems to be doing well although he is having some discomfort. 02/06/18 on evaluation today patient actually appears to be doing well at all locations except for his second toe use bilaterally where the distal tip is still wrong open. He did state that he can't stand for dry skin to buildup on his toes subsequently as a question and further it sounds as if he actually pills this all which is likely keeping the toe is wrong and open. Also think that's what happened in the bottom of his foot although that appears to have healed at this point. There does not appear to be any evidence of infection. 02/20/18 on evaluation today patient appears to be doing better in regard to his foot ulcers. Everything has shown signs of improvement which is excellent news. Overall I'm very pleased with how things appear currently. The patient states he's not picking her point at any skin and that he wears shoes at all times when he is ambulating at home. Both are things that I've advocated and  recommended for him in the past. 03/13/18 on evaluation today patient actually appears to be doing very well in regard to the bilateral toes in the overall appearance of the wound bed locations. Fortunately there does not appear to be any signs of infection at this time. He states that he is very pleased with how things seem to be progressing to be honest as am I. 03/27/1899 evaluation today patient appears to be doing well in regard to his toe ulcers. He just has a few areas remaining and these appear to be showing signs of improvement as well which is great news. Overall I have been impressed with the progress of the past several weeks. 04/10/18 on evaluation today patient appears to be doing a little bit worse in regard to the right plantar foot at the regional the first metatarsal as well as the left second toe. He tells me that he ran out of bandages he also tells me he's been wearing his flip-flops a lot at home as well as a new pair of boots that he received as well. This is as opposed to the postop shoes that we had previously given him. Nonetheless overall I feel like that he has had a little bit more friction to the area which is causing things to look a little bit worse today. 04/24/18 on evaluation today patient's wounds for the most part appear to be doing excellent especially in regard to the bilateral second toes. With that being said he does have issues with the wound still on the first metatarsal region and first toe of the right foot which have not completely closed at this point. Nonetheless I do feel like that he is making progress which is good news. 05/08/18 on evaluation today patient's wounds appear to be doing just a little bit worse than normal simply due to the fact that he has not had dressing supplies. He apparently owed $35 in order for them to be shipped and unfortunately is not able to pay that currently. We did give him information for the [employer]  today which will hopefully help cover his dressing supplies. He was thankful for this he states is gonna contact them this afternoon. Fortunately otherwise or does not hear any signs of infection. 05/22/18 on evaluation today patient appears to be doing somewhat poorly in regard to  his toes. Upon further questioning it appears that he actually has been pulling skin off of the toes which "drive him crazy". Subsequently as he does this he calls his new areas to tear open and I think this is one of the reasons why his toes are not healing as appropriately as we would like. He fortunately has no signs of infection at this time. With that being said I'm still concerned about the possibility of a fungal infection even though there doesn't appear to be anything bacterial. Potentially nystatin powder could be of benefit for him as far as trying to help this in that regard. 06/05/18 on evaluation today patient actually appears to be doing very well in regard to his foot ulcers. Fortunately there's no signs of infection. He's been tolerating the dressing changes without complication. I'm very pleased with how things seem to be progressing. Fortunately there's no evidence of active infection at this point I do believe nystatin powder has been of benefit for him. 06/19/18 on evaluation today patient actually appears to be doing a little worse compared to last evaluation. Tells me that he has not had any supplies for two weeks. With that being said his plantar foot wounds on the right actually appear to be doing very well. It's actually a second toes in the first toes that are the worst. I think these are areas the was able to get to and pick and he tells me in fact that he destine to pick out some of the dry skin because it causes him annoyance. Nonetheless I think this is somewhat detrimental to him I think it's worth keeping part of the wound open in particular. Fortunately there is no sign of infection at this  time although I think that is a risk which I discussed specifically that he's not careful you can end up reopening this and causing greater problems for himself. MOHMMAD, SALEEBY ([id number]) 07/03/18 on evaluation today patient's wound bed actually shows evidence of good granulation at this time and epithelialization as far as the wounds in general are concerned. He has been tolerating the dressing changes including the nystatin without complication. This is good news. With that being said he seems to be still having some trouble with the left second toe distally at this point. 4/9; most of the wounds look quite satisfactory. He requires debridement of the plantar right great toe otherwise everything looks superficial to improved. His left second toe is healed. 08/14/18 on evaluation today patient actually appears to be doing very well in regard to his foot ulcerations. In fact he just has three small areas of the right foot still remaining open the left foot is closed and dry at this point. Overall I feel like he is doing quite well which is excellent news. Fortunately there's no signs of active infection. 08/28/18 on evaluation today patient's toes of the right foot actually shows signs of being a little bit worse in regard to the overall open areas at this point. He does tell me that he "washes them well" when he is taking a shower. Again I explained that I want to make sure he's not washing them to aggressively as this could potentially cause issues with overall worsening. 09/12/18 on evaluation today patient actually appears to be doing well in regard to his right foot ulcers. He's been tolerating the dressing changes without complication. Fortunately there's no signs of active infection at this time. With that being said he does have a new one on  the dorsal surface of his foot just proximal to his toes he does not know really where this came from. 09/25/18 on evaluation today patient actually appears  to be doing somewhat better in regard to his right foot ulcers although he has a new opening on the left second toe unfortunately. This is something that seems to be back and forth he tells me did Pickett scan at this area that is why it's now open yet again. No fevers, chills, nausea, or vomiting noted at this time. 10/16/18 on evaluation today patient actually appears to be doing decently poorly in regard to his ulcers on his feet. He tells me that for the past 3-4 days he is not having to take therefore is not been putting on any dressings he came in with the socks actually just stuck to the toes with a been draining. He also has not picked up the nystatin powder that was sent to the pharmacy with refills all he has to do is go pick that up he has not done so. He also told me for the more that he feels like his "blood is low" which seems to be referring to his hemoglobin he [facility] and subsequently states he needs to go there when he leaves here in order to see about a blood transfusion. Nonetheless I think that's something that he definitely needs to get on top of if he feels like that's necessary. Fortunately there's no signs of active infection at this time. Patient History Information obtained from Patient. Social History Current every day smoker, Marital Status - Widowed, Alcohol Use - Daily - quit drinking about a week ago, Drug Use - No History, Caffeine Use - Moderate. Medical History Eyes Denies history of Cataracts, Glaucoma, Optic Neuritis Ear/Nose/Mouth/Throat Denies history of Chronic sinus problems/congestion, Middle ear problems Hematologic/Lymphatic Patient has history of Anemia, Lymphedema Denies history of Hemophilia, Human Immunodeficiency Virus, Sickle Cell Disease Respiratory Denies history of Aspiration, Asthma, Chronic Obstructive Pulmonary Disease (COPD), Pneumothorax, Sleep Apnea, Tuberculosis Cardiovascular Patient has  history of Congestive Heart Failure, Hypertension, Peripheral Venous Disease Denies history of Angina, Arrhythmia, Coronary Artery Disease, Deep Vein Thrombosis, Hypotension, Myocardial Infarction, Peripheral Arterial Disease, Phlebitis, Vasculitis Gastrointestinal Denies history of Cirrhosis , Colitis, Crohn s, Hepatitis A, Hepatitis B, Hepatitis C Endocrine Patient has history of Type II Diabetes Toole, Linton E. 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 Respiratory Denies complaints or symptoms of Chronic or frequent coughs, Shortness of Breath. Cardiovascular Complains or has symptoms of LE edema. Denies complaints or symptoms of Chest pain. Psychiatric Denies complaints or symptoms of Anxiety, Claustrophobia. Objective Constitutional Well-nourished and well-hydrated in no acute distress. Vitals Time Taken: 9:08 AM, Height: 69 in, Weight: 168 lbs, BMI: 24.8, Temperature: 98.2 F, Pulse: 77 bpm, Respiratory Rate: 16 breaths/min, Blood Pressure: 158/79 mmHg. Respiratory normal breathing without difficulty. clear to auscultation bilaterally. Cardiovascular regular rate and rhythm with normal S1, S2. Psychiatric this patient is able to make decisions and demonstrates good insight into  disease process. Alert and Oriented x 3. pleasant and  cooperative. Integumentary (Hair, Skin) Wound #16 status is Open. Original cause of wound was Not Known. The wound is located on the [employer]. The wound measures 1.1cm length x 2.2cm width x 0.1cm depth; 1.901cm^2 area and 0.19cm^3 volume. CLEMON, DEVAUL. ([id number]) Wound #18 status is Open. Original cause of wound was Gradually Appeared. The wound is located on the Right,Dorsal [employer] The wound measures 2cm length x 3.5cm width x 0.1cm depth; 5.498cm^2 area and 0.55cm^3 volume. Wound #2 status is Open. Original cause of wound was Gradually Appeared. The wound is located on the Right Toe Second. The wound measures 2cm length x 1.7cm width x 0.1cm depth; 2.67cm^2 area and 0.267cm^3 volume. Wound #20 status is Open. Original cause of wound was Pressure Injury. The wound is located on the Left Toe Second. The wound measures 0.1cm length x 0.1cm width x 0.1cm depth; 0.008cm^2 area and 0.001cm^3 volume. Wound #21 status is Open. Original cause of wound was Shear/Friction. The wound is located on the Left Toe Great. The wound measures 1cm length x 5cm width x 0.1cm depth; 3.927cm^2 area and 0.393cm^3 volume. Wound #22 status is Open. Original cause of wound was Gradually Appeared. The wound is located on the Right,Distal,Dorsal Foot. The wound measures 3cm length x 4.5cm width x 0.1cm depth; 10.603cm^2 area and 1.06cm^3 volume. 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 Wound #18 Right,Dorsal Toe Great: Clean wound with Normal Saline. Wound #2 Right Toe Second: Clean wound with Normal Saline. Wound #20 Left Toe Second: Clean wound with Normal Saline. Wound #21 Left Toe Great: Clean wound with Normal Saline. Wound #22 Right,Distal,Dorsal Foot: Clean wound with Normal Saline. Anesthetic (add to Medication List): Wound #16 Right,Plantar Toe Great: Topical Lidocaine 4% cream applied to wound bed prior to debridement (In Clinic Only). Wound #18 Right,Dorsal Toe GreatDOMANICK, CUCCIA. ([id number]) Topical Lidocaine 4% cream applied to wound bed prior to debridement (In Clinic Only). Wound #2 Right Toe Second: Topical Lidocaine 4% cream applied to wound bed prior to debridement (In Clinic Only). Wound #20 Left Toe Second: Topical Lidocaine 4% cream applied to wound bed prior to debridement (In Clinic Only). Wound #21 Left Toe Great: Topical Lidocaine 4% cream applied to wound bed prior to debridement (In Clinic Only). Wound #22 Right,Distal,Dorsal Foot: Topical Lidocaine 4% cream applied to wound bed prior to debridement (In Clinic Only). Skin Barriers/Peri-Wound Care: Wound #16 Right,Plantar Toe Great: Antifungal powder-Nystatin Wound #18 Right,Dorsal Toe Great: Antifungal powder-Nystatin Wound #2 Right Toe Second: Antifungal powder-Nystatin Wound #20 Left Toe Second: Antifungal powder-Nystatin Wound #21 Left Toe Great: Antifungal powder-Nystatin Wound #22 Right,Distal,Dorsal Foot: Antifungal powder-Nystatin Primary Wound Dressing: Wound #16 Right,Plantar Toe Great: Silver Alginate - secure with tape Wound #18 Right,Dorsal Toe Great: Silver Alginate - secure with tape Wound #2 Right Toe Second: Silver Alginate - secure with tape Wound #20 Left Toe Second: Silver Alginate - secure with tape Wound #21 Left Toe Great: Silver Alginate - secure with  tape Wound #  22 Right,Distal,Dorsal Foot: Silver Alginate - secure with tape Dressing Change Frequency: Wound #16 Right,Plantar Toe Great: Change dressing every other day. Wound #18 Right,Dorsal Toe Great: Change dressing every other day. Wound #2 Right Toe Second: Change dressing every other day. Wound #20 Left Toe Second: Change dressing every other day. Wound #21 Left Toe Great: Change dressing every other day. Wound #22 Right,Distal,Dorsal Foot: Change dressing every other day. Follow-up Appointments: Wound #16 Right,Plantar Toe Great: Return Appointment in 2 weeks. Wound #18 Right,Dorsal Toe Great: Return Appointment in 2 weeks. Wound #2 Right Toe Second: Return Appointment in 2 weeks. Wound #20 Left Toe Second: Return Appointment in 2 weeks. Wound #21 Left Toe Great: Return Appointment in 2 weeks. Wound #22 Right,Distal,Dorsal Foot: Rosamilia, Oakes E. ([id number]) Return Appointment in 2 weeks. Edema Control: Wound #16 Right,Plantar Toe Great: Patient to wear own compression stockings Wound #18 Right,Dorsal Toe Great: Patient to wear own compression stockings Wound #2 Right Toe Second: Patient to wear own compression stockings Wound #20 Left Toe Second: Patient to wear own compression stockings Wound #21 Left Toe Great: Patient to wear own compression stockings Wound #22 Right,Distal,Dorsal Foot: Patient to wear own compression stockings Additional Orders / Instructions: Stop Smoking Increase protein intake. Medications-please add to medication list.: Wound #16 Right,Plantar Toe Great: Other: - Nystatin Powder Wound #18 Right,Dorsal Toe Great: Other: - Nystatin Powder Wound #2 Right Toe Second: Other: - Nystatin Powder Wound #20 Left Toe Second: Other: - Nystatin Powder Wound #21 Left Toe Great: Other: - Nystatin Powder Wound #22 Right,Distal,Dorsal Foot: Other: - Nystatin Powder Upon inspection patient's wound bed actually showed signs of being okay there  were no evidence of infection that any of the sites. Unfortunately he is having issues with drainage and again without having any dressings not using the nystatin powder all the wounds appear to be doing better with a couple toes opening up that were previously drying clothes. He also tells me that he continues to pick up the toes which again is what caused them to open in the first place I've discussed multiple times with him he needs to not be doing this. Subsequently tells me is gonna go pick up the tape nystatin powder after he gets done with the cancer center in discussing with them the need for what he feels to be likely transfusion. We will see him back for reevaluation in two weeks time to see were things stand. Please see above for specific wound care orders. We will see patient for re-evaluation in 2 week(s) here in the clinic. If anything worsens or changes patient will contact our office for additional recommendations. Electronic Signature(s) Signed: 10/19/2018 4:13:38 PM By: Worthy Keeler PA-C Entered By: Worthy Keeler on 10/16/2018 09:48:42 Sisk, [name] ([id number]) -------------------------------------------------------------------------------- ROS/PFSH Details Patient Name: Osley, Lyden E. Date of Service: 10/16/2018 9:00 AM Medical Record Number: [id number] Patient Account Number: [id number] Date of Birth/Sex: April 13, 1950 (68 y.o. M) Treating RN: Army Melia Primary Care Provider: Lamonte Sakai Other Clinician: Referring Provider: Lamonte Sakai Treating Provider/Extender: Melburn Hake, Azariah Latendresse Weeks in Treatment: 107 Information Obtained From Patient Constitutional Symptoms (General Health) Complaints and Symptoms: Negative for: Fatigue; Fever; Chills; Marked Weight Change Respiratory Complaints and Symptoms: Negative for: Chronic or frequent coughs; Shortness of Breath Medical History: Negative for: Aspiration; Asthma; Chronic Obstructive Pulmonary Disease (COPD);  Pneumothorax; Sleep Apnea; Tuberculosis Cardiovascular Complaints and Symptoms: Positive for: LE edema Negative for: Chest pain Medical History: Positive for: Congestive Heart Failure; Hypertension; Peripheral Venous Disease Negative for:  Angina; Arrhythmia; Coronary Artery Disease; Deep Vein Thrombosis; Hypotension; Myocardial Infarction; Peripheral Arterial Disease; Phlebitis; Vasculitis Psychiatric Complaints and Symptoms: Negative for: Anxiety; Claustrophobia Eyes Medical History: Negative for: Cataracts; Glaucoma; Optic Neuritis Ear/Nose/Mouth/Throat Medical History: Negative for: Chronic sinus problems/congestion; Middle ear problems Hematologic/Lymphatic Medical History: Positive for: Anemia; Lymphedema Negative for: Hemophilia; Human Immunodeficiency Virus; Sickle Cell Disease Gastrointestinal Szydlowski, Dajion E. ([id number]) Medical History: Negative for: Cirrhosis ; Colitis; Crohnos; Hepatitis A; Hepatitis B; Hepatitis C Endocrine Medical History: Positive for: Type II Diabetes Treated with: Oral agents Blood sugar tested every day: Yes Tested : QD Genitourinary Medical History: Negative for: End Stage Renal Disease Immunological Medical History: Negative for: Lupus Erythematosus; Raynaudos; Scleroderma Integumentary (Skin) Medical History: Negative for: History of Burn; History of pressure wounds Musculoskeletal Medical History: Negative for: Gout; Rheumatoid Arthritis; Osteoarthritis; Osteomyelitis Neurologic Medical History: Positive for: Neuropathy Negative for: Dementia; Quadriplegia; Paraplegia; Seizure Disorder Oncologic Medical History: Past Medical History Notes: 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 KYDAN, SHANHOLTZER ([id number]) Electronic Signature(s) Signed: 10/16/2018 2:48:20 PM By: Army Melia Signed: 10/19/2018 4:13:38 PM By: Worthy Keeler PA-C Entered By: Worthy Keeler on 10/16/2018 09:36:42 Robin, [name] ([id number]) -------------------------------------------------------------------------------- SuperBill Details Patient Name: Summerhill, Ardie E. Date of Service: 10/16/2018 Medical Record Number: [id number] Patient Account Number: [id number] Date of Birth/Sex: 1950/10/27 (68 y.o. 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 I89.0 Lymphedema, not elsewhere classified F17.218 Nicotine dependence, cigarettes, 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 abuse 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, right ankle and foot Physician Procedures CPT4 Code Description: [id number] - WC PHYS LEVEL 4 - EST PT ICD-10 Diagnosis Description L97.521 Non-pressure chronic ulcer of other part of left foot limited to L97.511 Non-pressure chronic ulcer of other part of right foot limited t E11.621 Type 2  diabetes mellitus with foot ulcer I87.323 Chronic  venous hypertension (idiopathic) with inflammation of bi Modifier: breakdown of s o breakdown of lateral lower e Quantity: 1 kin skin xtremity Electronic Signature(s) Signed: 10/19/2018 4:13:38 PM By: Worthy Keeler PA-C Entered By: Worthy Keeler on 10/16/2018 09:49:21

## 2018-10-24 ENCOUNTER — Other Ambulatory Visit: Payer: Self-pay | Admitting: Oncology

## 2018-10-24 ENCOUNTER — Ambulatory Visit: Payer: Medicare Other

## 2018-10-24 ENCOUNTER — Other Ambulatory Visit: Payer: Medicare Other

## 2018-10-24 ENCOUNTER — Telehealth: Payer: Self-pay | Admitting: *Deleted

## 2018-10-24 ENCOUNTER — Other Ambulatory Visit: Payer: Self-pay | Admitting: *Deleted

## 2018-10-24 DIAGNOSIS — D649 Anemia, unspecified: Secondary | ICD-10-CM

## 2018-10-24 NOTE — Telephone Encounter (Signed)
Tonya with [facility] called with critical results on patient HGB 6.1. She is asking if we can see him today and transfuse him. Please return call to [phone]

## 2018-10-24 NOTE — Telephone Encounter (Signed)
I have called over to [facility] and told them that we can't give blood today. I have set the pt. Up for Monday and I left on the message that I will call pt and tell him about Monday and they can call back if there are any issues

## 2018-10-24 NOTE — Telephone Encounter (Signed)
I have called the patient and his cousin who drives him to appt and they will be here Monday 9:00 for labs and 9:45 for 1 unit of blood

## 2018-10-27 ENCOUNTER — Other Ambulatory Visit: Payer: Self-pay

## 2018-10-27 ENCOUNTER — Inpatient Hospital Stay: Payer: Medicare Other

## 2018-10-27 ENCOUNTER — Ambulatory Visit: Payer: Medicare Other

## 2018-10-27 DIAGNOSIS — Z862 Personal history of diseases of the blood and blood-forming organs and certain disorders involving the immune mechanism: Secondary | ICD-10-CM

## 2018-10-27 DIAGNOSIS — Z79899 Other long term (current) drug therapy: Secondary | ICD-10-CM

## 2018-10-27 DIAGNOSIS — I130 Hypertensive heart and chronic kidney disease with heart failure and stage 1 through stage 4 chronic kidney disease, or unspecified chronic kidney disease: Secondary | ICD-10-CM | POA: Diagnosis not present

## 2018-10-27 DIAGNOSIS — D649 Anemia, unspecified: Secondary | ICD-10-CM

## 2018-10-27 LAB — CBC WITH DIFFERENTIAL/PLATELET
Abs Immature Granulocytes: 0.04 10*3/uL (ref 0.00–0.07)
Basophils Absolute: 0.1 10*3/uL (ref 0.0–0.1)
Basophils Relative: 1 %
Eosinophils Absolute: 0.9 10*3/uL — ABNORMAL HIGH (ref 0.0–0.5)
Eosinophils Relative: 14 %
HCT: 20.5 % — ABNORMAL LOW (ref 39.0–52.0)
Hemoglobin: 6.3 g/dL — ABNORMAL LOW (ref 13.0–17.0)
Immature Granulocytes: 1 %
Lymphocytes Relative: 16 %
Lymphs Abs: 1 10*3/uL (ref 0.7–4.0)
MCH: 29.4 pg (ref 26.0–34.0)
MCHC: 30.7 g/dL (ref 30.0–36.0)
MCV: 95.8 fL (ref 80.0–100.0)
Monocytes Absolute: 0.8 10*3/uL (ref 0.1–1.0)
Monocytes Relative: 13 %
Neutro Abs: 3.5 10*3/uL (ref 1.7–7.7)
Neutrophils Relative %: 55 %
Platelets: 159 10*3/uL (ref 150–400)
RBC: 2.14 MIL/uL — ABNORMAL LOW (ref 4.22–5.81)
RDW: 13.8 % (ref 11.5–15.5)
WBC: 6.2 10*3/uL (ref 4.0–10.5)
nRBC: 0 % (ref 0.0–0.2)

## 2018-10-27 LAB — PREPARE RBC (CROSSMATCH)

## 2018-10-27 MED ORDER — ACETAMINOPHEN 325 MG PO TABS
650.0000 mg | ORAL_TABLET | Freq: Once | ORAL | Status: AC
  Administered 2018-10-27: 650 mg via ORAL
  Filled 2018-10-27: qty 2

## 2018-10-27 MED ORDER — SODIUM CHLORIDE 0.9% IV SOLUTION
250.0000 mL | Freq: Once | INTRAVENOUS | Status: AC
  Administered 2018-10-27: 10:00:00 250 mL via INTRAVENOUS
  Filled 2018-10-27: qty 250

## 2018-10-27 NOTE — Progress Notes (Signed)
Hemoglobin: 6.3. MD, Dr. Janese Banks, notified. Per MD order: patient to receive 1 unit pRBCs transfusion today; hold/do not give Retacrit injection today.

## 2018-10-28 ENCOUNTER — Inpatient Hospital Stay: Payer: Medicare Other

## 2018-10-28 LAB — TYPE AND SCREEN
ABO/RH(D): A POS
Antibody Screen: NEGATIVE
Unit division: 0

## 2018-10-28 LAB — BPAM RBC
Blood Product Expiration Date: 202008072359
ISSUE DATE / TIME: 202007201129
Unit Type and Rh: 6200

## 2018-10-30 ENCOUNTER — Other Ambulatory Visit: Payer: Self-pay

## 2018-10-30 ENCOUNTER — Encounter: Payer: Medicare Other | Admitting: Physician Assistant

## 2018-10-30 ENCOUNTER — Inpatient Hospital Stay: Payer: Medicare Other

## 2018-10-30 VITALS — BP 161/85 | HR 60 | Temp 97.0°F | Resp 18

## 2018-10-30 DIAGNOSIS — I130 Hypertensive heart and chronic kidney disease with heart failure and stage 1 through stage 4 chronic kidney disease, or unspecified chronic kidney disease: Secondary | ICD-10-CM | POA: Diagnosis not present

## 2018-10-30 DIAGNOSIS — D509 Iron deficiency anemia, unspecified: Secondary | ICD-10-CM

## 2018-10-30 MED ORDER — SODIUM CHLORIDE 0.9 % IV SOLN
510.0000 mg | Freq: Once | INTRAVENOUS | Status: AC
  Administered 2018-10-30: 14:00:00 510 mg via INTRAVENOUS
  Filled 2018-10-30: qty 17

## 2018-10-30 MED ORDER — SODIUM CHLORIDE 0.9 % IV SOLN
Freq: Once | INTRAVENOUS | Status: AC
  Administered 2018-10-30: 14:00:00 via INTRAVENOUS
  Filled 2018-10-30: qty 250

## 2018-10-31 ENCOUNTER — Encounter: Payer: Self-pay | Admitting: General Surgery

## 2018-11-03 NOTE — Progress Notes (Signed)
KRON, EVERTON ([id number]) Visit Report for 10/30/2018 Chief Complaint Document Details Patient Name: [name], [name] Date of Service: 10/30/2018 9:00 AM Medical Record Number: [id number] Patient Account Number: [id number] Date of Birth/Sex: [birth year] (68 y.o. M) Treating RN: Army Melia Primary Care Provider: Lamonte Sakai Other Clinician: Referring Provider: Lamonte Sakai Treating Provider/Extender: Melburn Hake, HOYT Weeks in Treatment: 109 Information Obtained from: Patient Chief Complaint Patient presents for treatment of an open diabetic ulcer to both feet Electronic Signature(s) Signed: 11/01/2018 12:16:19 AM By: Worthy Keeler PA-C Entered By: Worthy Keeler on 10/30/2018 09:24:25 [name], [name] ([id number]) -------------------------------------------------------------------------------- HPI Details Patient Name: [name], [name] Date of Service: 10/30/2018 9:00 AM Medical Record Number: [id number] Patient Account Number: [id number] Date of Birth/Sex: 07/18/1950 (68 y.o. M) Treating RN: Army Melia Primary Care Provider: Lamonte Sakai Other Clinician: Referring Provider: Lamonte Sakai Treating Provider/Extender: Melburn Hake, HOYT Weeks in Treatment: 109 History of Present Illness HPI Description: 68 year old patient here to see as for bilateral feet ulceration to on his left first and second toe and 2 on his right first and second toe, which she's had for about 4 months. 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 Donella Stade. The patient is also undergoing workup by medical oncology for a intra-abdominal lymphadenopathy. 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 He is also status post appendectomy, inguinal lymph node biopsy, rectal biopsy  and rectal examination under anesthesia. he currently smokes cigarettes about half packet a day. In March of this year he was seen by Dr. Hortencia Pilar, for evaluation of bilateral varicose veins and besides wearing compression stockings he had recommended laser ablation of the right and left great saphenous veins to eleviate the symptoms and complications of severe superficial venous reflux disease. He also recommended lymphedema pumps for better control of his lymphedema. The patient recently has had on 08/23/2016, right greater saphenous vein ablation with the laser energy Earlier lower extremity venous reflux examination done on 05/08/2016 showed no DVT or SVT both lower legs but incompetence of bilateral great saphenous veins was present. A lower arterial study was also done and there was no significant right lower and left lower extremity problems based on a normal toe brachial index bilaterally and the ABI was 1.21 the left and 1.23 on the right. His post ablation venous duplex examination showed successful ablation of the right GS vein with thrombus formation 2 below the right saphenofemoral junction. The deep system was patent without evidence of thrombosis and this was done on 08/30/2016. 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 Addendum: regarding his x-rays done today and x-ray of the left foot -- IMPRESSION: No objective evidence of osteomyelitis. There are soft tissue changes which may reflect cellulitis. X-ray of the right foot -- IMPRESSION:Findings compatible with cellulitis of the toes. No objective evidence of osteomyelitis is observed. 10/01/16 on evaluation today patient's wounds appeared to be doing some better. I did review the x-rays as well which showed no evidence of osteomyelitis although there was evidence on x-ray of cellulitis. He fortunately is not  having any discomfort although he continues to have some swelling. He does not remember being on any antibiotics  recently. 10/15/16 on evaluation today patient's wounds overall appear to be doing better although he does have a new location noted on the left foot. Fortunately he is not having significant pain. It almost has the appearance that something is rubbing on the end of his toes but he wears the open toe shoes and according to what he is telling me never wears anything that would rub on his foot. There is no evidence of infection and specifically no evidence of a fungal infection 10/22/16 On evaluation today patient's wounds appeared to be doing better compared to last week in regard to his bilateral lower extremities. Fortunately I happy with how things are progressing although he still has ulcers I feel like that he is improving and appropriate manner. 11/12/16 on evaluation today patient appears to be doing well in regard to his bilateral feet and the respective wounds. We have been using surrounding her dressings along with an antifungal cream which seems to be doing very well. He has no bilateral dysfunction noticed that the rituals are weight loss at this point. He also has no nausea or vomiting a note purulent discharge. He did see Vein and vascular today and he tells me that they told him he could have surgery for his venous stasis but they did not feel like it was worth it in his words. Fortunately patient's wounds do appear to be getting sneakily better. [name], [name] ([id number]) 11/26/2016 -- he says he is going to have some surgery during this week at [facility] for possibly a colon resection. 12/31/2016 -- the patient has been noncompliant with his smoking and I'm not sure whether he is also started drinking again. He continues to be very nonchalant about his care 01/14/2017 -- the patient's HandP has been reviewed well and I understand he is being compliant with trying to give  up smoking and his local dressing changes. He does not have any surgical options of 4 to him by his vascular surgeons.he was last seen in early August by Dr. Hortencia Pilar who recommended compression stockings,and possibly lymph pumps in 2-3 months after doing a review ultrasound. 01/28/2017 - the patient did not have any fresh complaints but on examination I noted a large lacerated wound on the plantar aspect of his right fourth toe which had a lot of necrotic debris and it probes down to bone. 02/07/2017 -- x-ray of the right foot -- IMPRESSION: Soft tissue swelling about the first through fourth toes consistent with cellulitis. New destructive change in the tuft of the distal phalanx of the great toe is consistent with osteomyelitis. 02/14/2017 -- the patient's MRI is pending this coming Monday and he still continues to smoke. We have again gone over off loading of his wounds in great detail and he says he's been compliant. 02/21/2017 -- MR of the right foot -- IMPRESSION: 1. Soft tissue ulcer at the tip of the first, second and third toe knows. Cortical irregularity and bone marrow edema in the first distal phalanx most concerning for osteomyelitis. Mild marrow edema in the second and third distal phalanx without definite cortical destruction which may reflect early osteomyelitis versus reactive marrow edema. 2. Soft tissue edema surrounding the first phalanx most consistent with cellulitis. the patient was also recently evaluated by his medical oncologist Dr. 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 She is treating him with weekly Procrit. She is also keeping intra-abdominal lymphadenopathy and right lower  lobe lung nodule under observation. 04/04/2017 -- he was seen by Dr. Adrian Prows on 03/25/2017 -- after review he empirically put him on ciprofloxacin and doxycycline as they have good bone penetration and good  bioavailability and it will cover the usual pathogens and diabetic foot osteomyelitis. He will check inflammatory markers and plan a 67-89 week old records. C-reactive protein was 0.3 and the ESR was 72 04/18/17 on evaluation today patient appears to be doing about the same in regard to his lower extremity wounds bilaterally. He has continued to use the antifungal cream which does seem to be beneficial. Nonetheless the ulcers do seem to in some areas be epithelial eyes over and in other areas are still open. He is having no significant discomfort. 04/25/17-he is here in follow-up evaluation for multiple ulcerations to multiple toes bilaterally. He states he did see Dr. Ola Spurr again last week and continues antibiotic therapy. He is voicing no complaints or concerns, will continue with current treatment plan will possibility of adding compression therapy next week after an additional week of treatment/lotions to BLE prescribed by Dr Ola Spurr 05/02/17 he is here in follow up for for multiple ulcers to multiple toes bilaterally. we will stop using antifungal cream and will continue with silvercel and follow up next week 05/09/17-he is here in follow-up for multiple ulcerations to multiple toes bilaterally. There is improvement in appearance. He has not completely stopped using antifungal cream, but admits he has not using it between the toes. He has an appointment with Dr. Ola Spurr on 2/11, continues on doxycycline and Cipro. It has been 5 weeks of antibiotic therapy, we will order plain film xray to evaluate for osteomyelitis next week, prior to follow up with ID. Will continue with silvercel and follow up next week 05/16/17-he is here in follow-up evaluation for multiple ulcerations to multiple toes bilaterally and new wound to the right posterior heel. There is essentially no change in appearance, deteriorating measurements; he has a history of waxing and waning measurements. He admits that he  continues to apply moisturizer/cream/ointment to his toes despite weekly reminders to only apply silvercel to his toes. He states that he thinks the surgical shoe contributed to the superficial ulcer to his posterior heel, he is unable to articulate if this was an area of dry cracked skin as he has a similar area to the left heel. He now is wearing open toed slippers. He has an appointment with Dr. Ola Spurr on 2/11. We have ordered x-rays for her bilateral feet; he was advised to obtain the x-rays today or tomorrow. He will follow-up next week 05/23/17-he is here in follow-up evaluation for multiple ulcerations to multiple toes bilaterally and the right posterior heel. There is improvement in maceration. He has been compliant and not applying any moisturizing agent to his toes. He has been using Lac-Hydrin for his lower extremities with improvement. He did not go to his appointment on Monday with Dr. Ola Spurr secondary to financial concerns. X-rays for her bilateral feet showed: LEFT FOOT with slight erosion of the tuft of the distal phalanges of the left first and second toe suspicious for osteomyelitis, RIGHT FOOT with 1.erosion of the tufts of the distal Meth, Duy E. ([id number]) phalanges of the right first second and possibly third toes consistent with osteomyelitis, 2 no definitive abnormality of the calcaneus is seen on the images obtained, 3. Plantar calcaneal degenerative spur. We briefly discussed hyperbaric adjunctive therapy for treatment of chronic refractory osteomyelitis. I do not find an a1c in  EMR, will contact PCP for record, or order if needed. He has been encouraged to contact Dr Ola Spurr office regarding the follow-up appointment, encouraged him to inquire about payment plan. We will continue with same treatment plan and follow-up next week. He states he is still taking antibiotics and has "a lot" left. He states he has been taking them as directed, 2 pills twice daily.  According to Dr. Blane Ohara office notes he was originally started on 12/17 for 4 weeks and extended on 1/14 for an additional 4 weeks. He should be done with his antibiotic therapy, he was advised to bring his bottles and to his next appointment, we will contact pharmacy. 05/30/17-he is here in follow-up evaluation for multiple ulcerations to multiple toes bilaterally and the right posterior heel. He is accompanied by his brother-in-law. Wounds are stable. He has yet to make up with Dr. Ola Spurr. We contacted his PCP, with no record of recent A1c we will draw an A1c. His brother-in-law states that he was taken off all of his diabetic medication secondary to kidney function. 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 The antibiotics were counted and have approximately 2 weeks left, although they should be complete. The pill organizer reveals missing days. We discussed the need for consistent medications, to have optimal benefit of medication. He has a cousin that lives with him and he will ask her to check his organizer daily. He has been advised to follow up with Dr Ola Spurr, and will go by the office today. He has been advised to quit smoking. 06/06/17-he is here in follow up evaluation. He has had to make an appointment with Dr. Ola Spurr. He did have blood work obtained, a1c 5. He continues to take antibiotic therapy. Significant improvement in bilateral lower extremity edema with compression therapy. Essentially no change in ulcerations to toes. He states he is "going to try something different" and "let me know next week" if it works; he would not provide any additional information and was encouraged to follow our orders. We will follow up next week 06/13/17-he is here in follow-up evaluation. He has an appointment with Dr. Ola Spurr tomorrow morning. He states he purchased an ointment from  [facility] and applied to his toes for 3 days, he does not remember the name of the ointment. There is improvement to his wounds, minimal maceration. He continues to take antibiotic therapy, this should have been completed last month. His brother-in-law who regularly accompanies his appointments was asked to take the bottles to the appointment tomorrow with Dr. Ola Spurr so he is aware. We will continue with 3 layer compression, and order OPEN TOE compression 20-30mmHg; we will apply compression stockings next week. He continues to smoke, smoked "2 cigarettes" last week 06/20/17 on evaluation today patient did receive his compression stockings which he has with him today for both lower extremities. With that being said he tells me at this point in time that he is very happy to have these he really is not a big fan of the compression wraps that we have been utilizing although they have been of great benefit for him. Nonetheless at this point he does want to switch to the compression stockings. In my opinion as long as he is continuing with compression I'm okay with the stockings or the wraps. 06/27/17-he is here in follow-up evaluation for multiple ulcerations to his bilateral toes. There is some improvement in appearance. He is compliant  in wearing his compression stockings with significant improvement in lower extremity edema. He saw Dr Ola Spurr on 3/8, per his notes they would redraw ESR and CRP; plan to continue antibiotic therapy if these remain elevated. I do not see an ESR or CRP level in Epic. The patient continues to take antibiotics. 07/11/17-He is here in follow-up evaluation for multiple ulcerations to multiple toes bilaterally. He presents with complete epithelialization to the right third toe; there has been no deterioration. He continues on antibiotic therapy. He will follow-up next week 07/18/17-He is here in follow-up evaluation for multiple ulcerations to multiple toes bilaterally. He  continues to make improvement. He continues on antibiotic therapy. He states he has been using something additional to our orders, he does not elaborate but states he will bring it in next week. 07/25/17-He is here in follow up evaluation for multiple ulcerations to bilateral toes. He is stable. He has completed antibiotic therapy. He admits to "filing" his toes after showers each evening, this is what he was referencing last week; he does not filing for the wounds. We will switch to [facility] and monitor for any improvement, he will follow-up next week 08/01/17-He is here in follow-up evaluation. He admits to "picking" at his toes after cleansing yesterday, leading to new areas of tissue loss on the bilateral second toe. There is improvement noted to the bilateral great toe. We will dress toes today and hope that they maintain until Monday where he will come in for a nurse visit. He has been advised, multiple times with expressed verbalization, to change the dressings to silvercel if the dressings get wet prior to [facility] appointment. 08/08/17-He is here in follow-up evaluation for bilateral first and second toe ulcerations. There is significant improvement to all ulcerations since last visit. We will switch to [name] to all wounds and he will continue with nurse visits on a Monday/Thursday schedule and follow-up with me in 2 weeks. He continues to smoke, 1-3 cigarettes per day, and has been encouraged to not smoke until his next follow-up in 2 weeks. 08/15/17 on evaluation today patient's ulcers on his toes actually appear to be doing fairly well the right to ulcers may be a little bit more moist compared to the left I'm not really sure exactly why as the openings appear to be very small and I'm not seeing any evidence of anything significant as far as you lightest or otherwise. Nonetheless this may just be a small setback he's Baskett, Braian E. ([id number]) been doing very well with the  [facility] Dressing 08/22/17-He is here in follow-up evaluation for ulcerations to his bilateral first and second toes, there is small/scant amount of drainage noted on today's dressing. He continues with Hydrofera Blue. He continues to smoke, 1 cigarette a day. Voices no complaint or concerns, compliant and compression therapy today. He'll follow-up next week 08/29/17-He is here in follow-up evaluation for ulcerations to his bilateral first and second toes, with a new wound to the left third toe. He states he cut himself while cutting the toenail. He presents today with more maceration and increased measurements; waxing and waning measurements and moisture has been an ongoing issue. He continues to smoke a proximally 1 cigarette per day, but states he has not smoked since Sunday. We will initiate medihoney daily and evaluate next week. He presents today without compression stockings 09/05/17-He is here in follow-up evaluation for ulcerations to bilateral first and second and left third toe. He did not pick up the  medihoney and therefore has not been changing his dressing. He admits to inconsistent where of compression stockings, admits to pain to his bilateral lower extremities with unilateral edema (right greater than left). The ulcerations to multiple toes are more macerated and larger in size than last week. He is wearing compression therapy today. He admits to an overall feeling of weakness and fatigue and has been encouraged to contact his PCP for evaluation; he has a known history of anemia and intraabdominal lymphadenopathy. We will return to silvercel and he will follow up next week 09/19/17-He is here in follow-up evaluation for ulcerations to bilateral first and second toe.he was unable to make last week's appointment. The culture that was obtained on 5/30 grew clindamycin sensitive MRSA; he was initiated on 6/4 but did not start it until 6/7. There is significant improvement in all  wounds, healing to left second and third toe. He also admits to having no alcohol or smoking for the last 6 days. We will continue with same treatment plan I will extend the clindamycin for an additional 10 days and he will follow-up next week. 09/26/17- He is here in follow evaluation for multiple ulcerations to multiple toes bilaterally. He continues to be non-compliant with dressing, compression therapy. He states he is taking the antibiotics as prescribed, although his recall is inconsistent. There is noted deterioration in all ulcers. We will continue same treatment plan, antibiotic therapy and he will follow up next week. 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 This information is obtained from the medical record, as he cannot articulate specifics to his diagnosis or treatment plan. 10/17/17-He is here in follow-up evaluation for multiple ulcerations to multiple toes bilaterally. There is chronic waxing and waning improvement/deterioration. On today's exam they appear improved, dry without maceration. He continues to apply a variety of topical agents, stating that he has switched between the blue product and silver product. In my opinion, this will be a chronic waxing and waning giving his intermittent compliance. He will follow-up in 2 weeks. As it relates to the biopsy taken from his buttock, he has not followed up with that and has not started radiation therapy. 10/31/17-He is here for evaluation for multiple ulcerations to multiple toes bilaterally. Continues with waxing and waning improvement versus deterioration. They are stable on today's exam without significant maceration. 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 We will continue with every 2 week follow-up appointments 11/14/17- He  is seen in follow-up evaluation for multiple ulcerations to multiple bilateral toes. He is scheduled for excision of basal cell carcinoma to his right buttock on Monday 8/12. He continues to have chronic waxing and waning of his ulcers; today is a stable day with minimal drainage and left ear is better than right toes. I will recount to infectious disease regarding chronic suppressive therapy. He admits to being more compliant with compression stockings, although he is not wearing any today. He has been encouraged to continue with dressing changes as ordered and compression therapy daily. He will be seen in 2 weeks 11/28/17-He is seen in follow up evaluation for multiple ulcerations to multiple toes bilaterally. He had an overnight admission at [facility] (8/11-8/12) for symptomatic anemia, received two units prbc. His surgical date has been moved to 9/94 excision of basal cell carcinoma to  the right buttock. He has been noncompliant in wearing compression stockings, applying topical treatment as ordered; he has been applying a "cream", Hydrofera Blue, silvercel. He is currently on no antibiotic therapy; we will send for re-referral to ID for suppressive therapy given his multiple co-morbidities, poor surgical candidate, unwilling to have toe amputation and general non-adherence. 12/19/17 on evaluation today patient actually appears to be doing very well in regard to his toes at least compared to last time I saw him. He again has been coming to our office for quite a bit of time. 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 He states is hurting a little bit but doing well in general. I'm see were not taking care of this area dermatology is. He does have his appointment with infectious disease upcoming which they re-consult for suppressive therapy that's next Monday. 01/02/18 on evaluation  today patient actually appears to be doing rather well in regard to his toe ulcers although he tells me he has been put in an ointment on this which I am concerned may be causing some additional moisture buildup which we really do not want. He supposed be using silver cell which is mainly thing I want him to be utilizing. Fortunately there does not appear to be any evidence of worsening infection which is good news. He has had surgery on his gluteal region where he had an excision of a cancerous area fortunately that seems to be doing well although he is having some discomfort. 02/06/18 on evaluation today patient actually appears to be doing well at all locations except for his second toe use bilaterally [name], [name] ([id number]) where the distal tip is still wrong open. He did state that he can't stand for dry skin to buildup on his toes subsequently as a question and further it sounds as if he actually pills this all which is likely keeping the toe is wrong and open. Also think that's what happened in the bottom of his foot although that appears to have healed at this point. There does not appear to be any evidence of infection. 02/20/18 on evaluation today patient appears to be doing better in regard to his foot ulcers. Everything has shown signs of improvement which is excellent news. Overall I'm very pleased with how things appear currently. The patient states he's not picking her point at any skin and that he wears shoes at all times when he is ambulating at home. Both are things that I've advocated and recommended for him in the past. 03/13/18 on evaluation today patient actually appears to be doing very well in regard to the bilateral toes in the overall appearance of the wound bed locations. Fortunately there does not appear to be any signs of infection at this time. He states that he is very pleased with how things seem to be progressing to be honest as am I. 03/27/1899 evaluation today  patient appears to be doing well in regard to his toe ulcers. He just has a few areas remaining and these appear to be showing signs of improvement as well which is great news. Overall I have been impressed with the progress of the past several weeks. 04/10/18 on evaluation today patient appears to be doing a little bit worse in regard to the right plantar foot at the regional the first metatarsal as well as the left second toe. He tells me that  he ran out of bandages he also tells me he's been wearing his flip-flops a lot at home as well as a new pair of boots that he received as well. This is as opposed to the postop shoes that we had previously given him. Nonetheless overall I feel like that he has had a little bit more friction to the area which is causing things to look a little bit worse today. 04/24/18 on evaluation today patient's wounds for the most part appear to be doing excellent especially in regard to the bilateral second toes. With that being said he does have issues with the wound still on the first metatarsal region and first toe of the right foot which have not completely closed at this point. Nonetheless I do feel like that he is making progress which is good news. 05/08/18 on evaluation today patient's wounds appear to be doing just a little bit worse than normal simply due to the fact that he has not had dressing supplies. He apparently owed $35 in order for them to be shipped and unfortunately is not able to pay that currently. We did give him information for the [employer] today which will hopefully help cover his dressing supplies. He was thankful for this he states is gonna contact them this afternoon. Fortunately otherwise or does not hear any signs of infection. 05/22/18 on evaluation today patient appears to be doing somewhat poorly in regard to his toes. Upon further questioning it appears that he actually has been pulling skin off of the toes which "drive  him crazy". Subsequently as he does this he calls his new areas to tear open and I think this is one of the reasons why his toes are not healing as appropriately as we would like. He fortunately has no signs of infection at this time. With that being said I'm still concerned about the possibility of a fungal infection even though there doesn't appear to be anything bacterial. Potentially nystatin powder could be of benefit for him as far as trying to help this in that regard. 06/05/18 on evaluation today patient actually appears to be doing very well in regard to his foot ulcers. Fortunately there's no signs of infection. He's been tolerating the dressing changes without complication. I'm very pleased with how things seem to be progressing. Fortunately there's no evidence of active infection at this point I do believe nystatin powder has been of benefit for him. 06/19/18 on evaluation today patient actually appears to be doing a little worse compared to last evaluation. Tells me that he has not had any supplies for two weeks. With that being said his plantar foot wounds on the right actually appear to be doing very well. It's actually a second toes in the first toes that are the worst. I think these are areas the was able to get to and pick and he tells me in fact that he destine to pick out some of the dry skin because it causes him annoyance. Nonetheless I think this is somewhat detrimental to him I think it's worth keeping part of the wound open in particular. Fortunately there is no sign of infection at this time although I think that is a risk which I discussed specifically that he's not careful you can end up reopening this and causing greater problems for himself. 07/03/18 on evaluation today patient's wound bed actually shows evidence of good granulation at this time and epithelialization as far as the wounds in general are concerned. He  has been tolerating the dressing changes including the  nystatin without complication. This is good news. With that being said he seems to be still having some trouble with the left second toe distally at this point. 4/9; most of the wounds look quite satisfactory. He requires debridement of the plantar right great toe otherwise everything Senne, [name] ([id number]) looks superficial to improved. His left second toe is healed. 08/14/18 on evaluation today patient actually appears to be doing very well in regard to his foot ulcerations. In fact he just has three small areas of the right foot still remaining open the left foot is closed and dry at this point. Overall I feel like he is doing quite well which is excellent news. Fortunately there's no signs of active infection. 08/28/18 on evaluation today patient's toes of the right foot actually shows signs of being a little bit worse in regard to the overall open areas at this point. He does tell me that he "washes them well" when he is taking a shower. Again I explained that I want to make sure he's not washing them to aggressively as this could potentially cause issues with overall worsening. 09/12/18 on evaluation today patient actually appears to be doing well in regard to his right foot ulcers. He's been tolerating the dressing changes without complication. Fortunately there's no signs of active infection at this time. With that being said he does have a new one on the dorsal surface of his foot just proximal to his toes he does not know really where this came from. 09/25/18 on evaluation today patient actually appears to be doing somewhat better in regard to his right foot ulcers although he has a new opening on the left second toe unfortunately. This is something that seems to be back and forth he tells me did Pickett scan at this area that is why it's now open yet again. No fevers, chills, nausea, or vomiting noted at this time. 10/16/18 on evaluation today patient actually appears to be doing decently  poorly in regard to his ulcers on his feet. He tells me that for the past 3-4 days he is not having to take therefore is not been putting on any dressings he came in with the socks actually just stuck to the toes with a been draining. He also has not picked up the nystatin powder that was sent to the pharmacy with refills all he has to do is go pick that up he has not done so. He also told me for the more that he feels like his "blood is low" which seems to be referring to his hemoglobin he [facility] and subsequently states he needs to go there when he leaves here in order to see about a blood transfusion. Nonetheless I think that's something that he definitely needs to get on top of if he feels like that's necessary. Fortunately there's no signs of active infection at this time. 10/30/18 on evaluation today patient appears to be doing a little worse in regard to his lower extremity ulcers specifically his toes for the most part although he does have an open wound on the right dorsal foot. He has been picking at his toes which damages the skin and leads to what we are seeing today. Fortunately there is no signs of active infection systemically or locally. With that being said I am concerned about the fact that I'm not sure he's ever really gonna feel as long  as he keeps messing with his toes such as picking and bothering them which causes them to reopen on a regular basis. Electronic Signature(s) Signed: 11/01/2018 12:16:19 AM By: Worthy Keeler PA-C Entered By: Worthy Keeler on 10/31/2018 23:41:52 Sponaugle, [name] ([id number]) -------------------------------------------------------------------------------- Physical Exam Details Patient Name: Eliot, Roswell E. Date of Service: 10/30/2018 9:00 AM Medical Record Number: [id number] Patient Account Number: [id number] Date of Birth/Sex: Sep 19, 1950 (68 y.o. M) Treating RN: Army Melia Primary Care Provider: Lamonte Sakai Other Clinician: Referring Provider: Lamonte Sakai Treating Provider/Extender: Melburn Hake, HOYT Weeks in Treatment: 72 Constitutional Well-nourished and well-hydrated in no acute distress. Respiratory normal breathing without difficulty. clear to auscultation bilaterally. Cardiovascular regular rate and rhythm with normal S1, S2. Psychiatric this patient is able to make decisions and demonstrates good insight into disease process. Alert and Oriented x 3. pleasant and cooperative. Notes Upon inspection today patient's wound bed actually showed signs of maceration breakdown over most of his toes. He tells me that he needs to go get more dressings as well is more of the powder I'm not even sure how often he's really using this to be perfectly honest. He is also not using his compression stockings unfortunately this would also be a benefit for him. Electronic Signature(s) Signed: 11/01/2018 12:16:19 AM By: Worthy Keeler PA-C Entered By: Worthy Keeler on 10/31/2018 23:42:29 Saffo, [name] ([id number]) -------------------------------------------------------------------------------- Physician Orders Details Patient Name: Taborda, Treyvin E. Date of Service: 10/30/2018 9:00 AM Medical Record Number: [id number] Patient Account Number: [id number] Date of Birth/Sex: 01-Mar-1951 (68 y.o. M) Treating RN: Army Melia Primary Care Provider: Lamonte Sakai Other Clinician: Referring Provider: Lamonte Sakai Treating Provider/Extender: Melburn Hake, HOYT Weeks in Treatment: 35 Verbal / Phone Orders: No Diagnosis Coding ICD-10 Coding Code Description L97.521 Non-pressure chronic ulcer of other part of left foot limited to breakdown of skin L97.511 Non-pressure chronic ulcer of other part of right foot limited to breakdown of skin E11.621 Type 2 diabetes mellitus with foot ulcer I87.323 Chronic venous hypertension (idiopathic) with inflammation of bilateral lower extremity I89.0 Lymphedema, not  elsewhere classified F17.218 Nicotine dependence, cigarettes, 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 abuse 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, right ankle and foot Wound Cleansing Wound #16 Right,Plantar Toe Great o Clean wound with Normal Saline. Anesthetic (add to Medication List) Wound #16 Right,Plantar Toe Great o Topical Lidocaine 4% cream applied to wound bed prior to debridement (In Clinic Only). Wound #18 Right,Dorsal Toe Great o Topical Lidocaine 4% cream applied to wound bed prior to debridement (In Clinic Only). Wound #2 Right Toe Second o Topical Lidocaine 4% cream applied to wound bed prior to debridement (In Clinic Only). Wound #20 Left Toe Second o Topical Lidocaine 4% cream applied to wound bed prior to debridement (In Clinic Only). Wound #21 Left Toe Great o Topical Lidocaine 4% cream applied to wound bed prior to debridement (In Clinic Only). Wound #22 Right,Distal,Dorsal Foot o Topical Lidocaine 4% cream applied to wound bed prior to debridement (In Clinic Only). Skin Barriers/Peri-Wound Care Wound #16 Right,Plantar Toe Great o Antifungal powder-Nystatin Wound #18 Right,Dorsal Toe Great o Antifungal powder-Nystatin Wound #2 Right Toe Second Bisono, Blanchard E. ([id number]) o Antifungal powder-Nystatin Wound #20 Left Toe Second o Antifungal powder-Nystatin Wound #21 Left Toe Great o Antifungal powder-Nystatin Wound #22 Right,Distal,Dorsal Foot o Antifungal powder-Nystatin Primary Wound Dressing Wound #16 Right,Plantar Toe Great o Silver Alginate - secure with tape Wound #18 Right,Dorsal Toe Great o Silver Alginate - secure with tape  Wound #2 Right Toe Second o Silver Alginate - secure with tape Wound #20 Left Toe Second o Silver Alginate - secure with tape Wound #21 Left Toe Great o Silver Alginate - secure with tape Wound #22 Right,Distal,Dorsal Foot o Silver  Alginate - secure with tape Dressing Change Frequency Wound #16 Right,Plantar Toe Great o Change dressing every other day. Wound #18 Right,Dorsal Toe Great o Change dressing every other day. Wound #2 Right Toe Second o Change dressing every other day. Wound #20 Left Toe Second o Change dressing every other day. Wound #21 Left Toe Great o Change dressing every other day. Wound #22 Right,Distal,Dorsal Foot o Change dressing every other day. Follow-up Appointments Wound #16 Right,Plantar Toe Great o Return Appointment in 2 weeks. Wound #18 Right,Dorsal Toe Great o Return Appointment in 2 weeks. CLARANCE, BOLLARD ([id number]) Wound #2 Right Toe Second o Return Appointment in 2 weeks. Wound #20 Left Toe Second o Return Appointment in 2 weeks. Wound #21 Left Toe Great o Return Appointment in 2 weeks. Wound #22 Right,Distal,Dorsal Foot o Return Appointment in 2 weeks. Edema Control Wound #16 Right,Plantar Toe Great o Patient to wear own compression stockings Wound #18 Right,Dorsal Toe Great o Patient to wear own compression stockings Wound #2 Right Toe Second o Patient to wear own compression stockings Wound #20 Left Toe Second o Patient to wear own compression stockings Wound #21 Left Toe Great o Patient to wear own compression stockings Wound #22 Right,Distal,Dorsal Foot o Patient to wear own compression stockings Additional Orders / Instructions Wound #16 Right,Plantar Toe Great o Stop Smoking o Increase protein intake. Wound #18 Right,Dorsal Toe Great o Stop Smoking o Increase protein intake. Wound #2 Right Toe Second o Stop Smoking o Increase protein intake. Wound #20 Left Toe Second o Stop Smoking o Increase protein intake. Wound #21 Left Toe Great o Stop Smoking o Increase protein intake. Wound #22 Right,Distal,Dorsal Foot o Stop Smoking o Increase protein intake. DAYN, BARICH  ([id number]) Medications-please add to medication list. Wound #16 Right,Plantar Toe Great o Other: - Nystatin Powder Wound #18 Right,Dorsal Toe Great o Other: - Nystatin Powder Wound #2 Right Toe Second o Other: - Nystatin Powder Wound #20 Left Toe Second o Other: - Nystatin Powder Wound #21 Left Toe Great o Other: - Nystatin Powder Wound #22 Right,Distal,Dorsal Foot o Other: - Nystatin Powder Electronic Signature(s) Signed: 10/30/2018 3:30:04 PM By: Army Melia Signed: 11/01/2018 12:16:19 AM By: Worthy Keeler PA-C Entered By: Army Melia on 10/30/2018 09:54:15 Viens, [name] ([id number]) -------------------------------------------------------------------------------- Problem List Details Patient Name: Tackett, Sulaiman E. Date of Service: 10/30/2018 9:00 AM Medical Record Number: [id number] Patient Account Number: [id number] Date of Birth/Sex: 1950-12-17 (68 y.o. M) Treating RN: Army Melia Primary Care Provider: Lamonte Sakai Other Clinician: Referring Provider: Lamonte Sakai Treating Provider/Extender: Melburn Hake, HOYT Weeks in Treatment: 109 Active Problems ICD-10 Evaluated Encounter Code Description Active Date Today Diagnosis L97.521 Non-pressure chronic ulcer of other part of left foot limited to 09/21/2016 No Yes breakdown of skin L97.511 Non-pressure chronic ulcer of other part of right foot limited to 09/21/2016 No Yes breakdown of skin E11.621 Type 2 diabetes mellitus with foot ulcer 09/21/2016 No Yes I87.323 Chronic venous hypertension (idiopathic) with inflammation of 09/21/2016 No Yes bilateral lower extremity I89.0 Lymphedema, not elsewhere classified 09/21/2016 No Yes F17.218 Nicotine dependence, cigarettes, with other nicotine-induced 09/21/2016 No Yes disorders F10.19 Alcohol abuse with unspecified alcohol-induced disorder 09/21/2016 No Yes M86.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, right ankle and foot 02/21/2017 No Yes Inactive  Problems Resolved  Problems [name], [name] ([id number]) Electronic Signature(s) Signed: 11/01/2018 12:16:19 AM By: Worthy Keeler PA-C Entered By: Worthy Keeler on 10/30/2018 09:24:18 [name], [name] ([id number]) -------------------------------------------------------------------------------- Progress Note Details Patient Name: Voris, Louay E. Date of Service: 10/30/2018 9:00 AM Medical Record Number: [id number] Patient Account Number: [id number] Date of Birth/Sex: [birth year] (68 y.o. M) Treating RN: Army Melia Primary Care Provider: Lamonte Sakai Other Clinician: Referring Provider: Lamonte Sakai Treating Provider/Extender: Melburn Hake, HOYT Weeks in Treatment: 109 Subjective Chief Complaint Information obtained from Patient Patient presents for treatment of an open diabetic ulcer to both feet History of Present Illness (HPI) 68 year old patient here to see as for bilateral feet ulceration to on his left first and second toe and 2 on his right first and second toe, which she's had for about 4 months. 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 Donella Stade. The patient is also undergoing workup by medical oncology for a intra-abdominal lymphadenopathy. 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 He is also status post appendectomy, inguinal lymph node biopsy, rectal biopsy and rectal examination under anesthesia. he currently smokes cigarettes about half packet a day. In March of this year he was seen by Dr. Hortencia Pilar, for evaluation of bilateral varicose veins and besides wearing compression stockings he had recommended laser ablation of the right and left great saphenous veins to eleviate the symptoms and complications of severe superficial venous reflux  disease. He also recommended lymphedema pumps for better control of his lymphedema. The patient recently has had on 08/23/2016, right greater saphenous vein ablation with the laser energy Earlier lower extremity venous reflux examination done on 05/08/2016 showed no DVT or SVT both lower legs but incompetence of bilateral great saphenous veins was present. A lower arterial study was also done and there was no significant right lower and left lower extremity problems based on a normal toe brachial index bilaterally and the ABI was 1.21 the left and 1.23 on the right. His post ablation venous duplex examination showed successful ablation of the right GS vein with thrombus formation 2 below the right saphenofemoral junction. The deep system was patent without evidence of thrombosis and this was done on 08/30/2016. 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 Addendum: regarding his x-rays done today and x-ray of the left foot -- IMPRESSION: No objective evidence of osteomyelitis. There are soft tissue changes which may reflect cellulitis. X-ray of the right foot -- IMPRESSION:Findings compatible with cellulitis of the toes. No objective evidence of osteomyelitis is observed. 10/01/16 on evaluation today patient's wounds appeared to be doing some better. I did review the x-rays as well which showed no evidence of osteomyelitis although there was evidence on x-ray of cellulitis. He fortunately is not having any discomfort although he continues to have some swelling. He does not remember being on any antibiotics recently. 10/15/16 on evaluation today patient's wounds overall appear to be doing better although he does have a new location noted on the left foot. Fortunately he is not having significant pain. It almost has the appearance that something is rubbing on the end of his toes but he wears  the open toe  shoes and according to what he is telling me never wears anything that would rub on his foot. There is no evidence of infection and specifically no evidence of a fungal infection 10/22/16 On evaluation today patient's wounds appeared to be doing better compared to last week in regard to his bilateral lower extremities. Fortunately I happy with how things are progressing although he still has ulcers I feel like that he is improving and appropriate manner. ADAM, SANJUAN ([id number]) 11/12/16 on evaluation today patient appears to be doing well in regard to his bilateral feet and the respective wounds. We have been using surrounding her dressings along with an antifungal cream which seems to be doing very well. He has no bilateral dysfunction noticed that the rituals are weight loss at this point. He also has no nausea or vomiting a note purulent discharge. He did see Vein and vascular today and he tells me that they told him he could have surgery for his venous stasis but they did not feel like it was worth it in his words. Fortunately patient's wounds do appear to be getting sneakily better. 11/26/2016 -- he says he is going to have some surgery during this week at [facility] for possibly a colon resection. 12/31/2016 -- the patient has been noncompliant with his smoking and I'm not sure whether he is also started drinking again. He continues to be very nonchalant about his care 01/14/2017 -- the patient's HandP has been reviewed well and I understand he is being compliant with trying to give up smoking and his local dressing changes. He does not have any surgical options of 4 to him by his vascular surgeons.he was last seen in early August by Dr. Hortencia Pilar who recommended compression stockings,and possibly lymph pumps in 2-3 months after doing a review ultrasound. 01/28/2017 - the patient did not have any fresh complaints but on examination I noted a large lacerated wound on the  plantar aspect of his right fourth toe which had a lot of necrotic debris and it probes down to bone. 02/07/2017 -- x-ray of the right foot -- IMPRESSION: Soft tissue swelling about the first through fourth toes consistent with cellulitis. New destructive change in the tuft of the distal phalanx of the great toe is consistent with osteomyelitis. 02/14/2017 -- the patient's MRI is pending this coming Monday and he still continues to smoke. We have again gone over off loading of his wounds in great detail and he says he's been compliant. 02/21/2017 -- MR of the right foot -- IMPRESSION: 1. Soft tissue ulcer at the tip of the first, second and third toe knows. Cortical irregularity and bone marrow edema in the first distal phalanx most concerning for osteomyelitis. Mild marrow edema in the second and third distal phalanx without definite cortical destruction which may reflect early osteomyelitis versus reactive marrow edema. 2. Soft tissue edema surrounding the first phalanx most consistent with cellulitis. the patient was also recently evaluated by his medical oncologist Dr. 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 She is treating him with weekly Procrit. She is also keeping intra-abdominal lymphadenopathy and right lower lobe lung nodule under observation. 04/04/2017 -- he was seen by Dr. Adrian Prows on 03/25/2017 -- after review he empirically put him on ciprofloxacin and doxycycline as they have good bone penetration and good bioavailability and it will cover the usual pathogens and diabetic foot osteomyelitis. He will check inflammatory markers and  plan a 20-67 week old records. C-reactive protein was 0.3 and the ESR was 72 04/18/17 on evaluation today patient appears to be doing about the same in regard to his lower extremity wounds bilaterally. He has continued to use the antifungal cream which does seem to be  beneficial. Nonetheless the ulcers do seem to in some areas be epithelial eyes over and in other areas are still open. He is having no significant discomfort. 04/25/17-he is here in follow-up evaluation for multiple ulcerations to multiple toes bilaterally. He states he did see Dr. Ola Spurr again last week and continues antibiotic therapy. He is voicing no complaints or concerns, will continue with current treatment plan will possibility of adding compression therapy next week after an additional week of treatment/lotions to BLE prescribed by Dr Ola Spurr 05/02/17 he is here in follow up for for multiple ulcers to multiple toes bilaterally. we will stop using antifungal cream and will continue with silvercel and follow up next week 05/09/17-he is here in follow-up for multiple ulcerations to multiple toes bilaterally. There is improvement in appearance. He has not completely stopped using antifungal cream, but admits he has not using it between the toes. He has an appointment with Dr. Ola Spurr on 2/11, continues on doxycycline and Cipro. It has been 5 weeks of antibiotic therapy, we will order plain film xray to evaluate for osteomyelitis next week, prior to follow up with ID. Will continue with silvercel and follow up next week 05/16/17-he is here in follow-up evaluation for multiple ulcerations to multiple toes bilaterally and new wound to the right posterior heel. There is essentially no change in appearance, deteriorating measurements; he has a history of waxing and waning measurements. He admits that he continues to apply moisturizer/cream/ointment to his toes despite weekly reminders to only apply silvercel to his toes. He states that he thinks the surgical shoe contributed to the superficial ulcer to his posterior heel, he is unable to articulate if this was an area of dry cracked skin as he has a similar area to the left heel. He now is wearing open toed slippers. He has an appointment with  Dr. Ola Spurr on 2/11. We have ordered x-rays for her bilateral feet; [name], [name] ([id number]) he was advised to obtain the x-rays today or tomorrow. He will follow-up next week 05/23/17-he is here in follow-up evaluation for multiple ulcerations to multiple toes bilaterally and the right posterior heel. There is improvement in maceration. He has been compliant and not applying any moisturizing agent to his toes. He has been using Lac-Hydrin for his lower extremities with improvement. He did not go to his appointment on Monday with Dr. Ola Spurr secondary to financial concerns. X-rays for her bilateral feet showed: LEFT FOOT with slight erosion of the tuft of the distal phalanges of the left first and second toe suspicious for osteomyelitis, RIGHT FOOT with 1.erosion of the tufts of the distal phalanges of the right first second and possibly third toes consistent with osteomyelitis, 2 no definitive abnormality of the calcaneus is seen on the images obtained, 3. Plantar calcaneal degenerative spur. We briefly discussed hyperbaric adjunctive therapy for treatment of chronic refractory osteomyelitis. I do not find an a1c in EMR, will contact PCP for record, or order if needed. He has been encouraged to contact Dr Ola Spurr office regarding the follow-up appointment, encouraged him to inquire about payment plan. We will continue with same treatment plan and follow-up next week. He states he is still taking antibiotics and has "a lot" left.  He states he has been taking them as directed, 2 pills twice daily. According to Dr. Blane Ohara office notes he was originally started on 12/17 for 4 weeks and extended on 1/14 for an additional 4 weeks. He should be done with his antibiotic therapy, he was advised to bring his bottles and to his next appointment, we will contact pharmacy. 05/30/17-he is here in follow-up evaluation for multiple ulcerations to multiple toes bilaterally and the right posterior  heel. He is accompanied by his brother-in-law. Wounds are stable. He has yet to make up with Dr. Ola Spurr. We contacted his PCP, with no record of recent A1c we will draw an A1c. His brother-in-law states that he was taken off all of his diabetic medication secondary to kidney function. 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 The antibiotics were counted and have approximately 2 weeks left, although they should be complete. The pill organizer reveals missing days. We discussed the need for consistent medications, to have optimal benefit of medication. He has a cousin that lives with him and he will ask her to check his organizer daily. He has been advised to follow up with Dr Ola Spurr, and will go by the office today. He has been advised to quit smoking. 06/06/17-he is here in follow up evaluation. He has had to make an appointment with Dr. Ola Spurr. He did have blood work obtained, a1c 5. He continues to take antibiotic therapy. Significant improvement in bilateral lower extremity edema with compression therapy. Essentially no change in ulcerations to toes. He states he is "going to try something different" and "let me know next week" if it works; he would not provide any additional information and was encouraged to follow our orders. We will follow up next week 06/13/17-he is here in follow-up evaluation. He has an appointment with Dr. Ola Spurr tomorrow morning. He states he purchased an ointment from [facility] and applied to his toes for 3 days, he does not remember the name of the ointment. There is improvement to his wounds, minimal maceration. He continues to take antibiotic therapy, this should have been completed last month. His brother-in-law who regularly accompanies his appointments was asked to take the bottles to the appointment tomorrow with Dr. Ola Spurr so he is aware. We will  continue with 3 layer compression, and order OPEN TOE compression 20-30mmHg; we will apply compression stockings next week. He continues to smoke, smoked "2 cigarettes" last week 06/20/17 on evaluation today patient did receive his compression stockings which he has with him today for both lower extremities. With that being said he tells me at this point in time that he is very happy to have these he really is not a big fan of the compression wraps that we have been utilizing although they have been of great benefit for him. Nonetheless at this point he does want to switch to the compression stockings. In my opinion as long as he is continuing with compression I'm okay with the stockings or the wraps. 06/27/17-he is here in follow-up evaluation for multiple ulcerations to his bilateral toes. There is some improvement in appearance. He is compliant in wearing his compression stockings with significant improvement in lower extremity edema. He saw Dr Ola Spurr on 3/8, per his notes they would redraw ESR and CRP; plan to continue antibiotic therapy if these remain elevated. I do not see an ESR or CRP level in Epic. The patient continues to take antibiotics.  07/11/17-He is here in follow-up evaluation for multiple ulcerations to multiple toes bilaterally. He presents with complete epithelialization to the right third toe; there has been no deterioration. He continues on antibiotic therapy. He will follow-up next week 07/18/17-He is here in follow-up evaluation for multiple ulcerations to multiple toes bilaterally. He continues to make improvement. He continues on antibiotic therapy. He states he has been using something additional to our orders, he does not elaborate but states he will bring it in next week. 07/25/17-He is here in follow up evaluation for multiple ulcerations to bilateral toes. He is stable. He has completed antibiotic therapy. He admits to "filing" his toes after showers each evening, this  is what he was referencing last week; he does not filing for the wounds. We will switch to [facility] and monitor for any improvement, he will follow-up next week 08/01/17-He is here in follow-up evaluation. He admits to "picking" at his toes after cleansing yesterday, leading to new areas of tissue loss on the bilateral second toe. There is improvement noted to the bilateral great toe. We will dress toes today and hope that they maintain until Monday where he will come in for a nurse visit. He has been advised, multiple times with expressed verbalization, to change the dressings to silvercel if the dressings get wet prior to [facility] appointment. 08/08/17-He is here in follow-up evaluation for bilateral first and second toe ulcerations. There is significant improvement to all ulcerations since last visit. We will switch to [facility] to all wounds and he will continue with nurse visits on a Hickling, Jceon E. ([id number]) Monday/Thursday schedule and follow-up with me in 2 weeks. He continues to smoke, 1-3 cigarettes per day, and has been encouraged to not smoke until his next follow-up in 2 weeks. 08/15/17 on evaluation today patient's ulcers on his toes actually appear to be doing fairly well the right to ulcers may be a little bit more moist compared to the left I'm not really sure exactly why as the openings appear to be very small and I'm not seeing any evidence of anything significant as far as you lightest or otherwise. Nonetheless this may just be a small setback he's been doing very well with the [facility] Dressing 08/22/17-He is here in follow-up evaluation for ulcerations to his bilateral first and second toes, there is small/scant amount of drainage noted on today's dressing. He continues with Hydrofera Blue. He continues to smoke, 1 cigarette a day. Voices no complaint or concerns, compliant and compression therapy today. He'll follow-up next week 08/29/17-He is here in follow-up  evaluation for ulcerations to his bilateral first and second toes, with a new wound to the left third toe. He states he cut himself while cutting the toenail. He presents today with more maceration and increased measurements; waxing and waning measurements and moisture has been an ongoing issue. He continues to smoke a proximally 1 cigarette per day, but states he has not smoked since Sunday. We will initiate medihoney daily and evaluate next week. He presents today without compression stockings 09/05/17-He is here in follow-up evaluation for ulcerations to bilateral first and second and left third toe. He did not pick up the medihoney and therefore has not been changing his dressing. He admits to inconsistent where of compression stockings, admits to pain to his bilateral lower extremities with unilateral edema (right greater than left). The ulcerations to multiple toes are more macerated and larger in size than last week. He is wearing compression therapy today.  He admits to an overall feeling of weakness and fatigue and has been encouraged to contact his PCP for evaluation; he has a known history of anemia and intraabdominal lymphadenopathy. We will return to silvercel and he will follow up next week 09/19/17-He is here in follow-up evaluation for ulcerations to bilateral first and second toe.he was unable to make last week's appointment. The culture that was obtained on 5/30 grew clindamycin sensitive MRSA; he was initiated on 6/4 but did not start it until 6/7. There is significant improvement in all wounds, healing to left second and third toe. He also admits to having no alcohol or smoking for the last 6 days. We will continue with same treatment plan I will extend the clindamycin for an additional 10 days and he will follow-up next week. 09/26/17- He is here in follow evaluation for multiple ulcerations to multiple toes bilaterally. He continues to be non-compliant with dressing, compression  therapy. He states he is taking the antibiotics as prescribed, although his recall is inconsistent. There is noted deterioration in all ulcers. We will continue same treatment plan, antibiotic therapy and he will follow up next week. 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 This information is obtained from the medical record, as he cannot articulate specifics to his diagnosis or treatment plan. 10/17/17-He is here in follow-up evaluation for multiple ulcerations to multiple toes bilaterally. There is chronic waxing and waning improvement/deterioration. On today's exam they appear improved, dry without maceration. He continues to apply a variety of topical agents, stating that he has switched between the blue product and silver product. In my opinion, this will be a chronic waxing and waning giving his intermittent compliance. He will follow-up in 2 weeks. As it relates to the biopsy taken from his buttock, he has not followed up with that and has not started radiation therapy. 10/31/17-He is here for evaluation for multiple ulcerations to multiple toes bilaterally. Continues with waxing and waning improvement versus deterioration. They are stable on today's exam without significant maceration. 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 We will continue with every 2 week follow-up appointments 11/14/17- He is seen in follow-up evaluation for multiple ulcerations to multiple bilateral toes. He is scheduled for excision of basal cell carcinoma to his right buttock on Monday 8/12. He continues to have chronic waxing and waning of his ulcers; today is a stable day with minimal drainage and left ear is better than right toes. I will recount to infectious disease regarding chronic suppressive therapy. He  admits to being more compliant with compression stockings, although he is not wearing any today. He has been encouraged to continue with dressing changes as ordered and compression therapy daily. He will be seen in 2 weeks 11/28/17-He is seen in follow up evaluation for multiple ulcerations to multiple toes bilaterally. He had an overnight admission at [facility] (8/11-8/12) for symptomatic anemia, received two units prbc. His surgical date has been moved to 9/94 excision of basal cell carcinoma to the right buttock. He has been noncompliant in wearing compression stockings, applying topical treatment as ordered; he has been applying a "cream", Hydrofera Blue, silvercel. He is currently on no antibiotic therapy; we will send for re-referral to ID for suppressive therapy given his multiple co-morbidities, poor surgical candidate, unwilling to have toe  amputation and general non-adherence. 12/19/17 on evaluation today patient actually appears to be doing very well in regard to his toes at least compared to last time I saw him. He again has been coming to our office for quite a bit of time. 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 He states is hurting a little bit but doing well in general. I'm see were not taking care of this area dermatology is. He does have his appointment with infectious disease upcoming which they re-consult for suppressive therapy that's next Monday. 01/02/18 on evaluation today patient actually appears to be doing rather well in regard to his toe ulcers although he tells me he [name], [name] ([id number]) has been put in an ointment on this which I am concerned may be causing some additional moisture buildup which we really do not want. He supposed be using silver cell which is mainly thing I want him to be utilizing. Fortunately there does not appear to be any  evidence of worsening infection which is good news. He has had surgery on his gluteal region where he had an excision of a cancerous area fortunately that seems to be doing well although he is having some discomfort. 02/06/18 on evaluation today patient actually appears to be doing well at all locations except for his second toe use bilaterally where the distal tip is still wrong open. He did state that he can't stand for dry skin to buildup on his toes subsequently as a question and further it sounds as if he actually pills this all which is likely keeping the toe is wrong and open. Also think that's what happened in the bottom of his foot although that appears to have healed at this point. There does not appear to be any evidence of infection. 02/20/18 on evaluation today patient appears to be doing better in regard to his foot ulcers. Everything has shown signs of improvement which is excellent news. Overall I'm very pleased with how things appear currently. The patient states he's not picking her point at any skin and that he wears shoes at all times when he is ambulating at home. Both are things that I've advocated and recommended for him in the past. 03/13/18 on evaluation today patient actually appears to be doing very well in regard to the bilateral toes in the overall appearance of the wound bed locations. Fortunately there does not appear to be any signs of infection at this time. He states that he is very pleased with how things seem to be progressing to be honest as am I. 03/27/1899 evaluation today patient appears to be doing well in regard to his toe ulcers. He just has a few areas remaining and these appear to be showing signs of improvement as well which is great news. Overall I have been impressed with the progress of the past several weeks. 04/10/18 on evaluation today patient appears to be doing a little bit worse in regard to the right plantar foot at the regional the first  metatarsal as well as the left second toe. He tells me that he ran out of bandages he also tells me he's been wearing his flip-flops a lot at home as well as a new pair of boots that he received as well. This is as opposed to the postop shoes that we had previously given him. Nonetheless overall I feel like that he has  had a little bit more friction to the area which is causing things to look a little bit worse today. 04/24/18 on evaluation today patient's wounds for the most part appear to be doing excellent especially in regard to the bilateral second toes. With that being said he does have issues with the wound still on the first metatarsal region and first toe of the right foot which have not completely closed at this point. Nonetheless I do feel like that he is making progress which is good news. 05/08/18 on evaluation today patient's wounds appear to be doing just a little bit worse than normal simply due to the fact that he has not had dressing supplies. He apparently owed $35 in order for them to be shipped and unfortunately is not able to pay that currently. We did give him information for the [place] today which will hopefully help cover his dressing supplies. He was thankful for this he states is gonna contact them this afternoon. Fortunately otherwise or does not hear any signs of infection. 05/22/18 on evaluation today patient appears to be doing somewhat poorly in regard to his toes. Upon further questioning it appears that he actually has been pulling skin off of the toes which "drive him crazy". Subsequently as he does this he calls his new areas to tear open and I think this is one of the reasons why his toes are not healing as appropriately as we would like. He fortunately has no signs of infection at this time. With that being said I'm still concerned about the possibility of a fungal infection even though there doesn't appear to be anything bacterial. Potentially  nystatin powder could be of benefit for him as far as trying to help this in that regard. 06/05/18 on evaluation today patient actually appears to be doing very well in regard to his foot ulcers. Fortunately there's no signs of infection. He's been tolerating the dressing changes without complication. I'm very pleased with how things seem to be progressing. Fortunately there's no evidence of active infection at this point I do believe nystatin powder has been of benefit for him. 06/19/18 on evaluation today patient actually appears to be doing a little worse compared to last evaluation. Tells me that he has not had any supplies for two weeks. With that being said his plantar foot wounds on the right actually appear to be doing very well. It's actually a second toes in the first toes that are the worst. I think these are areas the was able to get to and pick and he tells me in fact that he destine to pick out some of the dry skin because it causes him annoyance. Nonetheless I think this is somewhat detrimental to him I think it's worth keeping part of the wound open in particular. Fortunately there is no sign of infection at this time although I think that is a risk which I discussed specifically that he's not careful you can end up reopening this and causing greater problems for himself. IZAAK, SAHR ([id number]) 07/03/18 on evaluation today patient's wound bed actually shows evidence of good granulation at this time and epithelialization as far as the wounds in general are concerned. He has been tolerating the dressing changes including the nystatin without complication. This is good news. With that being said he seems to be still having some trouble with the left second toe distally at this point. 4/9; most of the wounds look quite satisfactory. He requires debridement of the  plantar right great toe otherwise everything looks superficial to improved. His left second toe is healed. 08/14/18 on  evaluation today patient actually appears to be doing very well in regard to his foot ulcerations. In fact he just has three small areas of the right foot still remaining open the left foot is closed and dry at this point. Overall I feel like he is doing quite well which is excellent news. Fortunately there's no signs of active infection. 08/28/18 on evaluation today patient's toes of the right foot actually shows signs of being a little bit worse in regard to the overall open areas at this point. He does tell me that he "washes them well" when he is taking a shower. Again I explained that I want to make sure he's not washing them to aggressively as this could potentially cause issues with overall worsening. 09/12/18 on evaluation today patient actually appears to be doing well in regard to his right foot ulcers. He's been tolerating the dressing changes without complication. Fortunately there's no signs of active infection at this time. With that being said he does have a new one on the dorsal surface of his foot just proximal to his toes he does not know really where this came from. 09/25/18 on evaluation today patient actually appears to be doing somewhat better in regard to his right foot ulcers although he has a new opening on the left second toe unfortunately. This is something that seems to be back and forth he tells me did [place] scan at this area that is why it's now open yet again. No fevers, chills, nausea, or vomiting noted at this time. 10/16/18 on evaluation today patient actually appears to be doing decently poorly in regard to his ulcers on his feet. He tells me that for the past 3-4 days he is not having to take therefore is not been putting on any dressings he came in with the socks actually just stuck to the toes with a been draining. He also has not picked up the nystatin powder that was sent to the pharmacy with refills all he has to do is go pick that up he has not done so. He also  told me for the more that he feels like his "blood is low" which seems to be referring to his hemoglobin [facility] and subsequently states he needs to go there when he leaves here in order to see about a blood transfusion. Nonetheless I think that's something that he definitely needs to get on top of if he feels like that's necessary. Fortunately there's no signs of active infection at this time. 10/30/18 on evaluation today patient appears to be doing a little worse in regard to his lower extremity ulcers specifically his toes for the most part although he does have an open wound on the right dorsal foot. He has been picking at his toes which damages the skin and leads to what we are seeing today. Fortunately there is no signs of active infection systemically or locally. With that being said I am concerned about the fact that I'm not sure he's ever really gonna feel as long as he keeps messing with his toes such as picking and bothering them which causes them to reopen on a regular basis. Patient History Information obtained from Patient. Social History Current every day smoker, Marital Status - Widowed, Alcohol Use - Daily - quit drinking about a week ago, Drug Use - No  History, Caffeine Use - Moderate. Medical History Eyes Denies history of Cataracts, Glaucoma, Optic Neuritis Ear/Nose/Mouth/Throat Denies history of Chronic sinus problems/congestion, Middle ear problems Hematologic/Lymphatic Patient has history of Anemia, Lymphedema Denies history of Hemophilia, Human Immunodeficiency Virus, Sickle Cell Disease Respiratory Denies history of Aspiration, Asthma, Chronic Obstructive Pulmonary Disease (COPD), Pneumothorax, Sleep Apnea, Tuberculosis Cardiovascular Patient has history of Congestive Heart Failure, Hypertension, Peripheral Venous Disease [name], [name] ([id number]) Denies history of Angina, Arrhythmia, Coronary Artery Disease, Deep Vein  Thrombosis, Hypotension, Myocardial Infarction, Peripheral Arterial Disease, Phlebitis, Vasculitis Gastrointestinal Denies history of Cirrhosis , Colitis, Crohn s, Hepatitis A, Hepatitis B, Hepatitis C Endocrine Patient has history of Type II Diabetes Genitourinary Denies history of End Stage Renal Disease Immunological Denies history of Lupus Erythematosus, Raynaud s, Scleroderma Integumentary (Skin) Denies history of History of Burn, History of pressure wounds Musculoskeletal Denies history of Gout, Rheumatoid Arthritis, Osteoarthritis, Osteomyelitis Neurologic Patient has history of Neuropathy Denies history of Dementia, Quadriplegia, Paraplegia, Seizure Disorder Medical And Surgical History Notes 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 Respiratory Denies complaints or symptoms of Chronic or frequent coughs, Shortness of Breath. Cardiovascular Complains or has symptoms of LE edema. Denies complaints or symptoms of Chest pain. Psychiatric Denies complaints or symptoms of Anxiety, Claustrophobia. Objective Constitutional Well-nourished and well-hydrated in no acute distress. Vitals Time Taken: 9:16 AM, Height: 69 in, Weight: 168 lbs, BMI: 24.8, Temperature: 98.3 F, Pulse: 64 bpm, Respiratory Rate: 18 breaths/min, Blood Pressure: 165/76 mmHg. Respiratory normal breathing without difficulty. clear to auscultation bilaterally. Cardiovascular regular rate and rhythm with normal S1, S2. Psychiatric this patient is able to make decisions and demonstrates good insight into disease process. Alert and Oriented x 3. pleasant and cooperative. [name], [name] ([id number]) General Notes: Upon inspection today patient's wound bed actually showed signs of maceration breakdown over most of his toes. He tells me  that he needs to go get more dressings as well is more of the powder I'm not even sure how often he's really using this to be perfectly honest. He is also not using his compression stockings unfortunately this would also be a benefit for him. Integumentary (Hair, Skin) Wound #16 status is Open. Original cause of wound was Not Known. The wound is located on the [employer]. The wound measures 1.5cm length x 1.2cm width x 0.1cm depth; 1.414cm^2 area and 0.141cm^3 volume. There is Fat Layer (Subcutaneous Tissue) Exposed exposed. There is no tunneling or undermining noted. There is a large amount of serous drainage noted. The wound margin is flat and intact. There is large (67-100%) pink granulation within the wound bed. There is no necrotic tissue within the wound bed. Wound #18 status is Open. Original cause of wound was Gradually Appeared. The wound is located on the Right,Dorsal [employer] The wound measures 0.3cm length x 0.4cm width x 0.1cm depth; 0.094cm^2 area and 0.009cm^3 volume. There is Fat Layer (Subcutaneous Tissue) Exposed exposed. There is no tunneling or undermining noted. There is a small amount of serous drainage noted. The wound margin is flat and intact. There is large (67-100%) pale granulation within the wound bed. There is no necrotic tissue within the wound bed. Wound #2 status is Open. Original cause of wound was Gradually Appeared. The wound is located on the Right Toe Second. The wound measures 0.8cm length x 2cm width x 0.1cm depth; 1.257cm^2 area  and 0.126cm^3 volume. There is Fat Layer (Subcutaneous Tissue) Exposed exposed. There is no tunneling or undermining noted. There is a large amount of serous drainage noted. The wound margin is flat and intact. There is large (67-100%) pink granulation within the wound bed. There is no necrotic tissue within the wound bed. Wound #20 status is Open. Original cause of wound was Pressure Injury. The wound is located on  the Left Toe Second. The wound measures 1.2cm length x 1cm width x 0.1cm depth; 0.942cm^2 area and 0.094cm^3 volume. There is Fat Layer (Subcutaneous Tissue) Exposed exposed. There is no tunneling or undermining noted. There is a medium amount of serous drainage noted. The wound margin is flat and intact. There is large (67-100%) pink granulation within the wound bed. There is no necrotic tissue within the wound bed. Wound #21 status is Open. Original cause of wound was Shear/Friction. The wound is located on the Left Toe Great. The wound measures 0.1cm length x 0.1cm width x 0.1cm depth; 0.008cm^2 area and 0.001cm^3 volume. The wound is limited to skin breakdown. There is no tunneling or undermining noted. There is a small amount of serous drainage noted. The wound margin is flat and intact. There is large (67-100%) pink granulation within the wound bed. There is no necrotic tissue within the wound bed. Wound #22 status is Open. Original cause of wound was Gradually Appeared. The wound is located on the Right,Distal,Dorsal Foot. The wound measures 0.2cm length x 0.2cm width x 0.1cm depth; 0.031cm^2 area and 0.003cm^3 volume. The wound is limited to skin breakdown. There is no tunneling or undermining noted. There is a large amount of serous drainage noted. The wound margin is flat and intact. There is large (67-100%) pink granulation within the wound bed. There is no necrotic tissue within the wound bed. 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 ([id number]) Chronic multifocal osteomyelitis, right ankle and foot Plan Wound Cleansing: Wound #16 Right,Plantar Toe Great: Clean wound with Normal Saline. Anesthetic (add to Medication List): Wound #16 Right,Plantar Toe Great: Topical Lidocaine 4% cream applied to wound bed prior to debridement (In Clinic Only). Wound #18 Right,Dorsal Toe Great: Topical Lidocaine 4% cream applied to wound bed prior to debridement (In Clinic Only). Wound #2 Right Toe Second: Topical Lidocaine 4% cream applied to wound bed prior to debridement (In Clinic Only). Wound #20 Left Toe Second: Topical Lidocaine 4% cream applied to wound bed prior to debridement (In Clinic Only). Wound #21 Left Toe Great: Topical Lidocaine 4% cream applied to wound bed prior to debridement (In Clinic Only). Wound #22 Right,Distal,Dorsal Foot: Topical Lidocaine 4% cream applied to wound bed prior to debridement (In Clinic Only). Skin Barriers/Peri-Wound Care: Wound #16 Right,Plantar Toe Great: Antifungal powder-Nystatin Wound #18 Right,Dorsal Toe Great: Antifungal powder-Nystatin Wound #2 Right Toe Second: Antifungal powder-Nystatin Wound #20 Left Toe Second: Antifungal powder-Nystatin Wound #21 Left Toe Great: Antifungal powder-Nystatin Wound #22 Right,Distal,Dorsal Foot: Antifungal powder-Nystatin Primary Wound Dressing: Wound #16 Right,Plantar Toe Great: Silver Alginate - secure with tape Wound #18 Right,Dorsal Toe Great: Silver Alginate - secure with tape Wound #2 Right Toe Second: Silver Alginate - secure with tape  Wound #20 Left Toe Second: Silver Alginate - secure with tape Wound #21 Left Toe Great: Silver Alginate - secure with tape Wound #22 Right,Distal,Dorsal Foot: Silver Alginate - secure with tape Dressing Change Frequency: Wound #16 Right,Plantar Toe Great: Change dressing every other day. Wound #18 Right,Dorsal Toe Great: Change dressing every other day. Wound #2 Right  Toe Second: Change dressing every other day. Wound #20 Left Toe Second: Pulice, Zakar E. ([id number]) Change dressing every other day. Wound #21 Left Toe Great: Change dressing every other day. Wound #22 Right,Distal,Dorsal Foot: Change dressing every other day. Follow-up Appointments: Wound #16 Right,Plantar Toe Great: Return Appointment in 2 weeks. Wound #18 Right,Dorsal Toe Great: Return Appointment in 2 weeks. Wound #2 Right Toe Second: Return Appointment in 2 weeks. Wound #20 Left Toe Second: Return Appointment in 2 weeks. Wound #21 Left Toe Great: Return Appointment in 2 weeks. Wound #22 Right,Distal,Dorsal Foot: Return Appointment in 2 weeks. Edema Control: Wound #16 Right,Plantar Toe Great: Patient to wear own compression stockings Wound #18 Right,Dorsal Toe Great: Patient to wear own compression stockings Wound #2 Right Toe Second: Patient to wear own compression stockings Wound #20 Left Toe Second: Patient to wear own compression stockings Wound #21 Left Toe Great: Patient to wear own compression stockings Wound #22 Right,Distal,Dorsal Foot: Patient to wear own compression stockings Additional Orders / Instructions: Wound #16 Right,Plantar Toe Great: Stop Smoking Increase protein intake. Wound #18 Right,Dorsal Toe Great: Stop Smoking Increase protein intake. Wound #2 Right Toe Second: Stop Smoking Increase protein intake. Wound #20 Left Toe Second: Stop Smoking Increase protein intake. Wound #21 Left Toe Great: Stop Smoking Increase protein intake. Wound #22 Right,Distal,Dorsal Foot: Stop Smoking Increase protein intake. Medications-please add to medication list.: Wound #16 Right,Plantar Toe Great: Other: - Nystatin Powder Wound #18 Right,Dorsal Toe Great: Other: - Nystatin Powder Wound #2 Right Toe Second: Other: - Nystatin Powder Wound #20 Left Toe Second: Other: - Nystatin Powder Wound #21 Left Toe GreatELISHUA, RADFORD. ([id number]) Other: -  Nystatin Powder Wound #22 Right,Distal,Dorsal Foot: Other: - Nystatin Powder My suggestion at this time is gonna be that we continue with the current recommended wound care measures. Again I think if you would follow these measures you would actually do quite well the problem is he doesn't really seem to follow them all that well. Overall I'm very pleased with the progress that's been made up to this point. But again he keeps having setbacks due to not following the recommendations. I will see were things stand at follow-up. Please see above for specific wound care orders. We will see patient for re-evaluation in 1 week(s) here in the clinic. If anything worsens or changes patient will contact our office for additional recommendations. Electronic Signature(s) Signed: 11/01/2018 12:16:19 AM By: Worthy Keeler PA-C Entered By: Worthy Keeler on 10/31/2018 23:43:05 Stenberg, [name] ([id number]) -------------------------------------------------------------------------------- ROS/PFSH Details Patient Name: Darrah, Quinten E. Date of Service: 10/30/2018 9:00 AM Medical Record Number: [id number] Patient Account Number: [id number] Date of Birth/Sex: 01-13-1951 (68 y.o. M) Treating RN: Army Melia Primary Care Provider: Lamonte Sakai Other Clinician: Referring Provider: Lamonte Sakai Treating Provider/Extender: Melburn Hake, HOYT Weeks in Treatment: 109 Information Obtained From Patient Constitutional Symptoms (General Health) Complaints and Symptoms: Negative for: Fatigue; Fever; Chills; Marked Weight Change Respiratory Complaints and Symptoms: Negative for: Chronic or frequent coughs; Shortness of Breath Medical History: Negative for: Aspiration; Asthma; Chronic Obstructive Pulmonary Disease (COPD); Pneumothorax; Sleep Apnea; Tuberculosis Cardiovascular Complaints and Symptoms: Positive for: LE edema Negative for:  Chest pain Medical History: Positive for: Congestive Heart Failure; Hypertension;  Peripheral Venous Disease Negative for: Angina; Arrhythmia; Coronary Artery Disease; Deep Vein Thrombosis; Hypotension; Myocardial Infarction; Peripheral Arterial Disease; Phlebitis; Vasculitis Psychiatric Complaints and Symptoms: Negative for: Anxiety; Claustrophobia Eyes Medical History: Negative for: Cataracts; Glaucoma; Optic Neuritis Ear/Nose/Mouth/Throat Medical History: Negative for: Chronic sinus problems/congestion; Middle ear problems Hematologic/Lymphatic Medical History: Positive for: Anemia; Lymphedema Negative for: Hemophilia; Human Immunodeficiency Virus; Sickle Cell Disease Gastrointestinal Pompei, Dareon E. ([id number]) Medical History: Negative for: Cirrhosis ; Colitis; Crohnos; Hepatitis A; Hepatitis B; Hepatitis C Endocrine Medical History: Positive for: Type II Diabetes Treated with: Oral agents Blood sugar tested every day: Yes Tested : QD Genitourinary Medical History: Negative for: End Stage Renal Disease Immunological Medical History: Negative for: Lupus Erythematosus; Raynaudos; Scleroderma Integumentary (Skin) Medical History: Negative for: History of Burn; History of pressure wounds Musculoskeletal Medical History: Negative for: Gout; Rheumatoid Arthritis; Osteoarthritis; Osteomyelitis Neurologic Medical History: Positive for: Neuropathy Negative for: Dementia; Quadriplegia; Paraplegia; Seizure Disorder Oncologic Medical History: Past Medical History Notes: 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 DICKIE, CLOE ([id number]) Electronic Signature(s) Signed: 11/01/2018 12:16:19 AM By: Worthy Keeler PA-C Signed: 11/03/2018 11:41:51 AM By: Army Melia Entered By: Worthy Keeler on 10/31/2018 23:42:17 [place], [name] ([id number]) -------------------------------------------------------------------------------- SuperBill Details Patient Name: Luscher, Safi E. Date of Service: 10/30/2018 Medical Record Number: [id number] Patient Account Number: [id number] Date of Birth/Sex: Nov 15, 1950 (68 y.o. M) Treating RN: Army Melia Primary Care Provider: Lamonte Sakai Other Clinician: Referring Provider: Lamonte Sakai Treating Provider/Extender: Melburn Hake, HOYT Weeks in Treatment: 109 Diagnosis Coding ICD-10 Codes Code Description L97.521 Non-pressure chronic ulcer of other part of left foot limited to breakdown of skin L97.511 Non-pressure chronic ulcer of other part of right foot limited to breakdown of skin E11.621 Type 2 diabetes mellitus with foot ulcer I87.323 Chronic venous hypertension (idiopathic) with inflammation of bilateral lower extremity I89.0 Lymphedema, not elsewhere classified F17.218 Nicotine dependence, cigarettes, 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 abuse 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, right ankle and foot Physician Procedures CPT4 Code Description: [id number] - WC PHYS LEVEL 4 - EST PT ICD-10 Diagnosis Description L97.521 Non-pressure chronic ulcer of other part of left foot limited to L97.511 Non-pressure chronic ulcer of other part of right foot limited t E11.621 Type 2  diabetes mellitus with foot ulcer I87.323 Chronic venous hypertension (idiopathic) with inflammation of bi Modifier: breakdown of s o breakdown of lateral lower e Quantity: 1 kin skin xtremity Electronic Signature(s) Signed: 11/01/2018 12:16:19 AM  By: Worthy Keeler PA-C Entered By: Worthy Keeler on 10/30/2018 16:46:02

## 2018-11-04 ENCOUNTER — Ambulatory Visit: Payer: Medicare Other

## 2018-11-04 NOTE — Progress Notes (Signed)
FADEL, CLASON ([id number]) Visit Report for 10/30/2018 Arrival Information Details Patient Name: [name], [name] Date of Service: 10/30/2018 9:00 AM Medical Record Number: [id number] Patient Account Number: [id number] Date of Birth/Sex: Feb 27, 1951 (68 y.o. M) Treating RN: Montey Hora Primary Care Justyne Roell: Lamonte Sakai Other Clinician: Referring Cathie Bonnell: Lamonte Sakai Treating Alika Saladin/Extender: Melburn Hake, HOYT Weeks in Treatment: 109 Visit Information History Since Last Visit Added or deleted any medications: No Patient Arrived: Ambulatory Any new allergies or adverse reactions: No Arrival Time: 09:14 Had a fall or experienced change in No Accompanied By: self activities of daily living that may affect Transfer Assistance: None risk of falls: Patient Identification Verified: Yes Signs or symptoms of abuse/neglect since last visito No Secondary Verification Process Completed: Yes Hospitalized since last visit: No Patient Requires Transmission-Based No Implantable device outside of the clinic excluding No Precautions: cellular tissue based products placed in the center Patient Has Alerts: Yes since last visit: Patient Alerts: DMII Has Dressing in Place as Prescribed: Yes Pain Present Now: No Electronic Signature(s) Signed: 10/30/2018 4:10:49 PM By: Montey Hora Entered By: Montey Hora on 10/30/2018 09:16:15 [name], [name] ([id number]) -------------------------------------------------------------------------------- Clinic Level of Care Assessment Details Patient Name: [name], [name] Date of Service: 10/30/2018 9:00 AM Medical Record Number: [id number] Patient Account Number: [id number] Date of Birth/Sex: [birth year] (68 y.o. M) Treating RN: Army Melia Primary Care Tiphani Mells: Lamonte Sakai Other Clinician: Referring Laysa Kimmey: Lamonte Sakai Treating Kimmberly Wisser/Extender: Melburn Hake, HOYT Weeks in Treatment: 109 Clinic Level of Care Assessment Items TOOL 4 Quantity Score X  - Use when only an EandM is performed on FOLLOW-UP visit 1 0 ASSESSMENTS - Nursing Assessment / Reassessment X - Reassessment of Co-morbidities (includes updates in patient status) 1 10 X- 1 5 Reassessment of Adherence to Treatment Plan ASSESSMENTS - Wound and Skin Assessment / Reassessment []  - Simple Wound Assessment / Reassessment - one wound 0 X- 6 5 Complex Wound Assessment / Reassessment - multiple wounds []  - 0 Dermatologic / Skin Assessment (not related to wound area) ASSESSMENTS - Focused Assessment []  - Circumferential Edema Measurements - multi extremities 0 []  - 0 Nutritional Assessment / Counseling / Intervention []  - 0 Lower Extremity Assessment (monofilament, tuning fork, pulses) []  - 0 Peripheral Arterial Disease Assessment (using hand held doppler) ASSESSMENTS - Ostomy and/or Continence Assessment and Care []  - Incontinence Assessment and Management 0 []  - 0 Ostomy Care Assessment and Management (repouching, etc.) PROCESS - Coordination of Care X - Simple Patient / Family Education for ongoing care 1 15 []  - 0 Complex (extensive) Patient / Family Education for ongoing care []  - 0 Staff obtains Programmer, systems, Records, Test Results / Process Orders []  - 0 Staff telephones HHA, Nursing Homes / Clarify orders / etc []  - 0 Routine Transfer to another Facility (non-emergent condition) []  - 0 Routine Hospital Admission (non-emergent condition) []  - 0 New Admissions / Biomedical engineer / Ordering NPWT, Apligraf, etc. []  - 0 Emergency Hospital Admission (emergent condition) X- 1 10 Simple Discharge Coordination [name], [name] ([id number]) []  - 0 Complex (extensive) Discharge Coordination PROCESS - Special Needs []  - Pediatric / Minor Patient Management 0 []  - 0 Isolation Patient Management []  - 0 Hearing / Language / Visual special needs []  - 0 Assessment of Community assistance (transportation, D/C planning, etc.) []  - 0 Additional assistance / Altered  mentation []  - 0 Support Surface(s) Assessment (bed, cushion, seat, etc.) INTERVENTIONS - Wound Cleansing / Measurement []  - Simple Wound Cleansing - one wound 0 X- 6 5  Complex Wound Cleansing - multiple wounds X- 1 5 Wound Imaging (photographs - any number of wounds) []  - 0 Wound Tracing (instead of photographs) []  - 0 Simple Wound Measurement - one wound X- 6 5 Complex Wound Measurement - multiple wounds INTERVENTIONS - Wound Dressings []  - Small Wound Dressing one or multiple wounds 0 X- 6 15 Medium Wound Dressing one or multiple wounds []  - 0 Large Wound Dressing one or multiple wounds []  - 0 Application of Medications - topical []  - 0 Application of Medications - injection INTERVENTIONS - Miscellaneous []  - External ear exam 0 []  - 0 Specimen Collection (cultures, biopsies, blood, body fluids, etc.) []  - 0 Specimen(s) / Culture(s) sent or taken to Lab for analysis []  - 0 Patient Transfer (multiple staff / Civil Service fast streamer / Similar devices) []  - 0 Simple Staple / Suture removal (25 or less) []  - 0 Complex Staple / Suture removal (26 or more) []  - 0 Hypo / Hyperglycemic Management (close monitor of Blood Glucose) []  - 0 Ankle / Brachial Index (ABI) - do not check if billed separately X- 1 5 Vital Signs [name], [name] ([id number]) Has the patient been seen at the hospital within the last three years: Yes Total Score: 230 Level Of Care: New/Established - Level 5 Electronic Signature(s) Signed: 10/30/2018 3:30:04 PM By: Army Melia Entered By: Army Melia on 10/30/2018 09:55:01 [name], [name] ([id number]) -------------------------------------------------------------------------------- Encounter Discharge Information Details Patient Name: [name], [name] Date of Service: 10/30/2018 9:00 AM Medical Record Number: [id number] Patient Account Number: [id number] Date of Birth/Sex: 03/23/1951 (68 y.o. M) Treating RN: Cornell Barman Primary Care Verma Grothaus: Lamonte Sakai Other  Clinician: Referring Vickye Astorino: Lamonte Sakai Treating Lachelle Rissler/Extender: Melburn Hake, HOYT Weeks in Treatment: 109 Encounter Discharge Information Items Discharge Condition: Stable Ambulatory Status: Ambulatory Discharge Destination: Home Transportation: Private Auto Accompanied By: self Schedule Follow-up Appointment: Yes Clinical Summary of Care: Electronic Signature(s) Signed: 11/04/2018 11:55:38 AM By: Gretta Cool, BSN, RN, CWS, Kim RN, BSN Entered By: Gretta Cool, BSN, RN, CWS, Kim on 10/30/2018 10:05:30 [place], [name] ([id number]) -------------------------------------------------------------------------------- Lower Extremity Assessment Details Patient Name: [name], [name] Date of Service: 10/30/2018 9:00 AM Medical Record Number: [id number] Patient Account Number: [id number] Date of Birth/Sex: [birth year] (68 y.o. M) Treating RN: Montey Hora Primary Care Mordche Hedglin: Lamonte Sakai Other Clinician: Referring Maddalena Linarez: Lamonte Sakai Treating Shadeed Colberg/Extender: Melburn Hake, HOYT Weeks in Treatment: 109 Edema Assessment Assessed: [Left: No] [Right: No] Edema: [Left: No] [Right: Yes] Vascular Assessment Pulses: Dorsalis Pedis Palpable: [Left:Yes] [Right:Yes] Electronic Signature(s) Signed: 10/30/2018 4:10:49 PM By: Montey Hora Entered By: Montey Hora on 10/30/2018 09:33:37 Sturdy, [name] ([id number]) -------------------------------------------------------------------------------- Multi Wound Chart Details Patient Name: [name], [name] Date of Service: 10/30/2018 9:00 AM Medical Record Number: [id number] Patient Account Number: [id number] Date of Birth/Sex: 1951/02/08 (68 y.o. M) Treating RN: Army Melia Primary Care Taylour Lietzke: Lamonte Sakai Other Clinician: Referring Candra Wegner: Lamonte Sakai Treating Keyandra Swenson/Extender: Melburn Hake, HOYT Weeks in Treatment: 109 Vital Signs Height(in): 71 Pulse(bpm): 64 Weight(lbs): 168 Blood Pressure(mmHg): 165/76 Body Mass Index(BMI):  25 Temperature(F): 98.3 Respiratory Rate 18 (breaths/min): Photos: Wound Location: Right Toe Great - Plantar Right Toe Great - Dorsal Right Toe Second Wounding Event: Not Known Gradually Appeared Gradually Appeared Primary Etiology: Diabetic Wound/Ulcer of 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 Lower Extremity Lower Extremity Comorbid History: Anemia, Lymphedema, Anemia, Lymphedema, Anemia, Lymphedema, Congestive Heart Failure, Congestive Heart Failure, Congestive Heart Failure, Hypertension, Peripheral Hypertension, Peripheral Hypertension, Peripheral Venous Disease, Type II Venous Disease, Type II Venous  Disease, Type II Diabetes, Neuropathy Diabetes, Neuropathy Diabetes, Neuropathy Date Acquired: 10/29/2017 05/22/2018 06/11/2016 Weeks of Treatment: 52 23 109 Wound Status: Open Open Open Pending Amputation on No No Yes Presentation: Measurements L x W x D 1.5x1.2x0.1 0.3x0.4x0.1 0.8x2x0.1 (cm) Area (cm) : 1.414 0.094 1.257 Volume (cm) : 0.141 0.009 0.126 % Reduction in Area: 11.70% 88.30% 59.10% % Reduction in Volume: 11.90% 88.80% 59.00% Classification: Grade 1 Grade 1 Grade 1 Exudate Amount: Large Small Large Exudate Type: Serous Serous Serous Exudate Color: amber amber amber Wound Margin: Flat and Intact Flat and Intact Flat and Intact Granulation Amount: Large (67-100%) Large (67-100%) Large (67-100%) Granulation Quality: Pink Pale Pink Necrotic Amount: None Present (0%) None Present (0%) None Present (0%) Exposed Structures: Towery, Arben E. 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 Lower Extremity Lower Extremity Comorbid History: Anemia, Lymphedema, Anemia, Lymphedema, Anemia, Lymphedema, Congestive Heart Failure, Congestive Heart Failure, Congestive Heart Failure, Hypertension, Peripheral Hypertension, Peripheral Hypertension, Peripheral Venous Disease, Type II Venous Disease, Type II Venous Disease, Type II Diabetes, Neuropathy Diabetes, Neuropathy Diabetes, Neuropathy Date Acquired: 09/22/2018 10/13/2018 10/13/2018 Weeks of Treatment: 5 2 2  Wound Status: Open Open Open Pending Amputation on No No No Presentation: Measurements L x W x D 1.2x1x0.1 0.1x0.1x0.1 0.2x0.2x0.1 (cm) Area (cm) : 0.942 0.008 0.031 Volume (cm) : 0.094 0.001 0.003 % Reduction in Area: 76.00% 99.80% 99.70% % Reduction in Volume: 76.10% 99.70% 99.70% Classification: Grade 1 Grade 1 Grade 1 Exudate Amount: Medium Small Large Exudate Type: Serous Serous Serous Exudate Color: amber amber amber Wound Margin: Flat and Intact Flat and Intact Flat and Intact Granulation Amount: Large (67-100%) Large (67-100%) Large (67-100%) Granulation Quality: Pink Pink Pink Necrotic Amount: None Present (0%) None Present (0%) None Present (0%) Exposed Structures: Fat Layer (Subcutaneous Fascia: No Fascia: No Tissue) Exposed: Yes Fat Layer (Subcutaneous Fat Layer (Subcutaneous Tissue) Exposed: No Tissue) Exposed: No Tendon: No Tendon: No Muscle: No Muscle: No Joint: No Joint: No Bone: No Bone: No Limited to Skin Breakdown Limited to Skin Breakdown Epithelialization: Medium (34-66%) Large (67-100%) Large (67-100%) Treatment Notes Electronic Signature(s) Signed: 10/30/2018 3:30:04 PM By: Garald Braver ([id number]) Entered By: Army Melia on 10/30/2018 09:50:35 [name], [name]  ([id number]) -------------------------------------------------------------------------------- [name] Details Patient Name: Smejkal, Joselito E. Date of Service: 10/30/2018 9:00 AM Medical Record Number: [id number] Patient Account Number: [id number] Date of Birth/Sex: 02-Jun-1950 (68 y.o. M) Treating RN: Army Melia Primary Care Julianah Marciel: Lamonte Sakai Other Clinician: Referring Alivya Wegman: Lamonte Sakai Treating Fidel Caggiano/Extender: Melburn Hake, HOYT Weeks in Treatment: 35 Active Inactive Abuse / Safety / Falls / Self Care Management Nursing Diagnoses: Potential for falls Goals: Patient will remain injury free related to falls Date Initiated: 09/21/2016 Target Resolution Date: 09/14/2017 Goal Status: Active Interventions: Assess fall risk on admission and as needed Notes: Nutrition Nursing Diagnoses: Potential for alteratiion in Nutrition/Potential for imbalanced nutrition Goals: Patient/caregiver agrees to and verbalizes understanding of need to use nutritional supplements and/or vitamins as prescribed Date Initiated: 09/21/2016 Target Resolution Date: 09/14/2017 Goal Status: Active Interventions: Assess patient nutrition upon admission and as needed per policy Notes: Orientation to the Wound Care Program Nursing Diagnoses:  Knowledge deficit related to the wound healing center program Goals: Patient/caregiver will verbalize understanding of the [facility] Date Initiated: 09/21/2016 Target Resolution Date: 06/15/2017 Goal Status: Active Interventions: Provide education on orientation to the wound center Mehler, Klayton E. ([id number]) Notes: Wound/Skin Impairment Nursing Diagnoses: Knowledge deficit related to smoking impact on wound healing Goals: Ulcer/skin breakdown will have a volume reduction of 30% by week 4 Date Initiated: 09/21/2016 Target Resolution Date: 08/17/2017 Goal Status: Active Ulcer/skin breakdown will have a volume reduction of 50% by  week 8 Date Initiated: 09/21/2016 Target Resolution Date: 08/17/2017 Goal Status: Active Ulcer/skin breakdown will have a volume reduction of 80% by week 12 Date Initiated: 09/21/2016 Target Resolution Date: 09/14/2017 Goal Status: Active Ulcer/skin breakdown will heal within 14 weeks Date Initiated: 09/21/2016 Target Resolution Date: 08/17/2017 Goal Status: Active Interventions: Assess patient/caregiver ability to obtain necessary supplies Assess patient/caregiver ability to perform ulcer/skin care regimen upon admission and as needed Assess ulceration(s) every visit Notes: Electronic Signature(s) Signed: 10/30/2018 3:30:04 PM By: Army Melia Entered By: Army Melia on 10/30/2018 09:50:24 [name], [name] ([id number]) -------------------------------------------------------------------------------- Pain Assessment Details Patient Name: [name], [name] Date of Service: 10/30/2018 9:00 AM Medical Record Number: [id number] Patient Account Number: [id number] Date of Birth/Sex: 1951-04-01 (68 y.o. M) Treating RN: Montey Hora Primary Care Keiarah Orlowski: Lamonte Sakai Other Clinician: Referring Mineola Duan: Lamonte Sakai Treating Akeyla Molden/Extender: Melburn Hake, HOYT Weeks in Treatment: 109 Active Problems Location of Pain Severity and Description of Pain Patient Has Paino No Site Locations Pain Management and Medication Current Pain Management: Electronic Signature(s) Signed: 10/30/2018 4:10:49 PM By: Montey Hora Entered By: Montey Hora on 10/30/2018 09:16:21 Rahilly, [name] ([id number]) -------------------------------------------------------------------------------- Patient/Caregiver Education Details Patient Name: [name], [name] Date of Service: 10/30/2018 9:00 AM Medical Record Number: [id number] Patient Account Number: [id number] Date of Birth/Gender: March 28, 1951 (68 y.o. M) Treating RN: Army Melia Primary Care Physician: Lamonte Sakai Other Clinician: Referring Physician: Lamonte Sakai Treating Physician/Extender: Sharalyn Ink in Treatment: 109 Education Assessment Education Provided To: Patient Education Topics Provided Wound/Skin Impairment: Handouts: Caring for Your Ulcer Methods: Demonstration, Explain/Verbal Responses: State content correctly Electronic Signature(s) Signed: 10/30/2018 3:30:04 PM By: Army Melia Entered By: Army Melia on 10/30/2018 09:55:39 Stipp, [name] ([id number]) -------------------------------------------------------------------------------- Wound Assessment Details Patient Name: Hippert, Marciano E. Date of Service: 10/30/2018 9:00 AM Medical Record Number: [id number] Patient Account Number: [id number] Date of Birth/Sex: [birth year] (68 y.o. M) Treating RN: Montey Hora Primary Care Quorra Rosene: Lamonte Sakai Other Clinician: Referring Ashmi Blas: Lamonte Sakai Treating Sahan Pen/Extender: Melburn Hake, HOYT Weeks in Treatment: 109 Wound Status Wound Number: 16 Primary Diabetic Wound/Ulcer of the Lower Extremity Etiology: Wound Location: Right Toe Great - Plantar Wound Open Wounding Event: Not Known Status: Date Acquired: 10/29/2017 Comorbid Anemia, Lymphedema, Congestive Heart Weeks Of Treatment: 52 History: Failure, Hypertension, Peripheral Venous Clustered Wound: No Disease, Type II Diabetes, Neuropathy Photos Wound Measurements Length: (cm) 1.5 % Reductio Width: (cm) 1.2 % Reductio Depth: (cm) 0.1 Epithelial Area: (cm) 1.414 Tunneling Volume: (cm) 0.141 Undermini n in Area: 11.7% n in Volume: 11.9% ization: Small (1-33%) : No ng: No Wound Description Classification: Grade 1 Foul Odor Wound Margin: Flat and Intact Slough/Fi Exudate Amount: Large Exudate Type: Serous Exudate Color: amber After Cleansing: No brino No Wound Bed Granulation Amount: Large (67-100%) Exposed Structure Granulation Quality: Pink Fat Layer (Subcutaneous Tissue) Exposed: Yes Necrotic Amount: None Present (0%) Treatment  Notes Wound #16 (Right, Plantar Toe Great) Notes Nystatin Power, SilverCell secured with tape Pineiro, Forrest E. ([id number]) Electronic Signature(s)  Signed: 10/30/2018 4:10:49 PM By: Montey Hora Entered By: Montey Hora on 10/30/2018 09:27:55 Blakney, [name] ([id number]) -------------------------------------------------------------------------------- Wound Assessment Details Patient Name: Simonetti, Jaterrius E. Date of Service: 10/30/2018 9:00 AM Medical Record Number: [id number] Patient Account Number: [id number] Date of Birth/Sex: 1950/07/08 (68 y.o. M) Treating RN: Montey Hora Primary Care Layden Caterino: Lamonte Sakai Other Clinician: Referring Walton Digilio: Lamonte Sakai Treating Kinslee Dalpe/Extender: Melburn Hake, HOYT Weeks in Treatment: 109 Wound Status Wound Number: 18 Primary Diabetic Wound/Ulcer of the Lower Extremity Etiology: Wound Location: Right Toe Great - Dorsal Wound Open Wounding Event: Gradually Appeared Status: Date Acquired: 05/22/2018 Comorbid Anemia, Lymphedema, Congestive Heart Weeks Of Treatment: 23 History: Failure, Hypertension, Peripheral Venous Clustered Wound: No Disease, Type II Diabetes, Neuropathy Photos Wound Measurements Length: (cm) 0.3 % Reductio Width: (cm) 0.4 % Reductio Depth: (cm) 0.1 Epithelial Area: (cm) 0.094 Tunneling Volume: (cm) 0.009 Undermini n in Area: 88.3% n in Volume: 88.8% ization: Medium (34-66%) : No ng: No Wound Description Classification: Grade 1 Foul Odor Wound Margin: Flat and Intact Slough/Fi Exudate Amount: Small Exudate Type: Serous Exudate Color: amber After Cleansing: No brino No Wound Bed Granulation Amount: Large (67-100%) Exposed Structure Granulation Quality: Pale Fat Layer (Subcutaneous Tissue) Exposed: Yes Necrotic Amount: None Present (0%) Treatment Notes Wound #18 (Right, Dorsal Toe Great) Notes Nystatin Power, SilverCell secured with tape CORDAI, RODRIGUE ([id number]) Electronic Signature(s) Signed:  10/30/2018 4:10:49 PM By: Montey Hora Entered By: Montey Hora on 10/30/2018 09:28:49 Varady, Aseel E. ([id number]) -------------------------------------------------------------------------------- Wound Assessment Details Patient Name: Chaudhuri, Jerimie E. Date of Service: 10/30/2018 9:00 AM Medical Record Number: [id number] Patient Account Number: [id number] Date of Birth/Sex: Sep 02, 1950 (68 y.o. M) Treating RN: Montey Hora Primary Care Ryu Cerreta: Lamonte Sakai Other Clinician: Referring Keena Dinse: Lamonte Sakai Treating Zaidy Absher/Extender: Melburn Hake, HOYT Weeks in Treatment: 109 Wound Status Wound Number: 2 Primary Diabetic Wound/Ulcer of the Lower Extremity Etiology: Wound Location: Right Toe Second Wound Open Wounding Event: Gradually Appeared Status: Date Acquired: 06/11/2016 Comorbid Anemia, Lymphedema, Congestive Heart Weeks Of Treatment: 109 History: Failure, Hypertension, Peripheral Venous Clustered Wound: No Disease, Type II Diabetes, Neuropathy Pending Amputation On Presentation Photos Wound Measurements Length: (cm) 0.8 % Reductio Width: (cm) 2 % Reductio Depth: (cm) 0.1 Epithelial Area: (cm) 1.257 Tunneling Volume: (cm) 0.126 Undermini n in Area: 59.1% n in Volume: 59% ization: Medium (34-66%) : No ng: No Wound Description Classification: Grade 1 Foul Odor Wound Margin: Flat and Intact Slough/Fi Exudate Amount: Large Exudate Type: Serous Exudate Color: amber After Cleansing: No brino No Wound Bed Granulation Amount: Large (67-100%) Exposed Structure Granulation Quality: Pink Fat Layer (Subcutaneous Tissue) Exposed: Yes Necrotic Amount: None Present (0%) Treatment Notes Wound #2 (Right Toe Second) Notes Nystatin Power, SilverCell secured with tape CALBERT, HULSEBUS ([id number]) Electronic Signature(s) Signed: 10/30/2018 4:10:49 PM By: Montey Hora Entered By: Montey Hora on 10/30/2018 09:29:40 Lavin, [name]  ([id number]) -------------------------------------------------------------------------------- Wound Assessment Details Patient Name: Haque, Mckoy E. Date of Service: 10/30/2018 9:00 AM Medical Record Number: [id number] Patient Account Number: [id number] Date of Birth/Sex: 12-May-1950 (68 y.o. M) Treating RN: Montey Hora Primary Care Maritsa Hunsucker: Lamonte Sakai Other Clinician: Referring Chase Arnall: Lamonte Sakai Treating Shenna Brissette/Extender: Melburn Hake, HOYT Weeks in Treatment: 109 Wound Status Wound Number: 20 Primary Diabetic Wound/Ulcer of the Lower Extremity Etiology: Wound Location: Left Toe Second Wound Open Wounding Event: Pressure Injury Status: Date Acquired: 09/22/2018 Comorbid Anemia, Lymphedema, Congestive Heart Weeks Of Treatment: 5 History: Failure, Hypertension, Peripheral Venous Clustered Wound: No Disease, Type II Diabetes, Neuropathy Photos Wound Measurements Length: (cm) 1.2 %  Reductio Width: (cm) 1 % Reductio Depth: (cm) 0.1 Epithelial Area: (cm) 0.942 Tunneling Volume: (cm) 0.094 Undermini n in Area: 76% n in Volume: 76.1% ization: Medium (34-66%) : No ng: No Wound Description Classification: Grade 1 Foul Odor Wound Margin: Flat and Intact Slough/Fi Exudate Amount: Medium Exudate Type: Serous Exudate Color: amber After Cleansing: No brino No Wound Bed Granulation Amount: Large (67-100%) Exposed Structure Granulation Quality: Pink Fat Layer (Subcutaneous Tissue) Exposed: Yes Necrotic Amount: None Present (0%) Treatment Notes Wound #20 (Left Toe Second) Notes Nystatin Power, SilverCell secured with tape YASIN, DUCAT ([id number]) Electronic Signature(s) Signed: 10/30/2018 4:10:49 PM By: Montey Hora Entered By: Montey Hora on 10/30/2018 09:30:29 Pelphrey, [name] ([id number]) -------------------------------------------------------------------------------- Wound Assessment Details Patient Name: Macioce, Kalen E. Date of Service: 10/30/2018 9:00  AM Medical Record Number: [id number] Patient Account Number: [id number] Date of Birth/Sex: 1950-07-15 (68 y.o. M) Treating RN: Montey Hora Primary Care Ortencia Askari: Lamonte Sakai Other Clinician: Referring Burnett Lieber: Lamonte Sakai Treating Macaulay Reicher/Extender: Melburn Hake, HOYT Weeks in Treatment: 109 Wound Status Wound Number: 21 Primary Diabetic Wound/Ulcer of the Lower Extremity Etiology: Wound Location: Left Toe Great Wound Open Wounding Event: Shear/Friction Status: Date Acquired: 10/13/2018 Comorbid Anemia, Lymphedema, Congestive Heart Weeks Of Treatment: 2 History: Failure, Hypertension, Peripheral Venous Clustered Wound: No Disease, Type II Diabetes, Neuropathy Photos Wound Measurements Length: (cm) 0.1 % Reduction in Width: (cm) 0.1 % Reduction in Depth: (cm) 0.1 Epithelializat Area: (cm) 0.008 Tunneling: Volume: (cm) 0.001 Undermining: Area: 99.8% Volume: 99.7% ion: Large (67-100%) No No Wound Description Classification: Grade 1 Foul Odor Aft Wound Margin: Flat and Intact Slough/Fibrin Exudate Amount: Small Exudate Type: Serous Exudate Color: amber er Cleansing: No o No Wound Bed Granulation Amount: Large (67-100%) Exposed Structure Granulation Quality: Pink Fascia Exposed: No Necrotic Amount: None Present (0%) Fat Layer (Subcutaneous Tissue) Exposed: No Tendon Exposed: No Muscle Exposed: No Joint Exposed: No Bone Exposed: No Limited to Skin Breakdown Scheirer, Davante E. ([id number]) Treatment Notes Wound #21 (Left Toe Great) Notes Nystatin Power, SilverCell secured with tape Electronic Signature(s) Signed: 10/30/2018 4:10:49 PM By: Montey Hora Entered By: Montey Hora on 10/30/2018 09:31:32 Motter, Arvin EMarland Kitchen ([id number]) -------------------------------------------------------------------------------- Wound Assessment Details Patient Name: Rivkin, Sundeep E. Date of Service: 10/30/2018 9:00 AM Medical Record Number: [id number] Patient Account Number:  [id number] Date of Birth/Sex: 1951/03/19 (68 y.o. M) Treating RN: Montey Hora Primary Care Jahliyah Trice: Lamonte Sakai Other Clinician: Referring Wandell Scullion: Lamonte Sakai Treating Jakhai Fant/Extender: Melburn Hake, HOYT Weeks in Treatment: 109 Wound Status Wound Number: 22 Primary Diabetic Wound/Ulcer of the Lower Extremity Etiology: Wound Location: Right Foot - Dorsal, Distal Wound Open Wounding Event: Gradually Appeared Status: Date Acquired: 10/13/2018 Comorbid Anemia, Lymphedema, Congestive Heart Weeks Of Treatment: 2 History: Failure, Hypertension, Peripheral Venous Clustered Wound: No Disease, Type II Diabetes, Neuropathy Photos Wound Measurements Length: (cm) 0.2 % Reduction i Width: (cm) 0.2 % Reduction i Depth: (cm) 0.1 Epithelializa Area: (cm) 0.031 Tunneling: Volume: (cm) 0.003 Undermining: n Area: 99.7% n Volume: 99.7% tion: Large (67-100%) No No Wound Description Classification: Grade 1 Foul Odor Af Wound Margin: Flat and Intact Slough/Fibri Exudate Amount: Large Exudate Type: Serous Exudate Color: amber ter Cleansing: No no No Wound Bed Granulation Amount: Large (67-100%) Exposed Structure Granulation Quality: Pink Fascia Exposed: No Necrotic Amount: None Present (0%) Fat Layer (Subcutaneous Tissue) Exposed: No Tendon Exposed: No Muscle Exposed: No Joint Exposed: No Bone Exposed: No Limited to Skin Breakdown Horlacher, Jahzir E. ([id number]) Treatment Notes Wound #22 (Right, Distal, Dorsal Foot) Notes Nystatin Power, SilverCell secured with tape  Electronic Signature(s) Signed: 10/30/2018 4:10:49 PM By: Montey Hora Entered By: Montey Hora on 10/30/2018 09:32:19 Fleming, [name] ([id number]) -------------------------------------------------------------------------------- Vitals Details Patient Name: Wolman, Braxson E. Date of Service: 10/30/2018 9:00 AM Medical Record Number: [id number] Patient Account Number: [id number] Date of Birth/Sex: Jul 27, 1950 (68  y.o. M) Treating RN: Montey Hora Primary Care Norah Fick: Lamonte Sakai Other Clinician: Referring Arkeem Harts: Lamonte Sakai Treating Sakshi Sermons/Extender: Melburn Hake, HOYT Weeks in Treatment: 109 Vital Signs Time Taken: 09:16 Temperature (F): 98.3 Height (in): 69 Pulse (bpm): 64 Weight (lbs): 168 Respiratory Rate (breaths/min): 18 Body Mass Index (BMI): 24.8 Blood Pressure (mmHg): 165/76 Reference Range: 80 - 120 mg / dl Electronic Signature(s) Signed: 10/30/2018 4:10:49 PM By: Montey Hora Entered By: Montey Hora on 10/30/2018 09:17:54

## 2018-11-06 ENCOUNTER — Other Ambulatory Visit: Payer: Self-pay

## 2018-11-06 ENCOUNTER — Inpatient Hospital Stay: Payer: Medicare Other

## 2018-11-06 VITALS — BP 138/82 | HR 76 | Temp 97.5°F | Resp 18

## 2018-11-06 DIAGNOSIS — D509 Iron deficiency anemia, unspecified: Secondary | ICD-10-CM

## 2018-11-06 DIAGNOSIS — I130 Hypertensive heart and chronic kidney disease with heart failure and stage 1 through stage 4 chronic kidney disease, or unspecified chronic kidney disease: Secondary | ICD-10-CM | POA: Diagnosis not present

## 2018-11-06 MED ORDER — SODIUM CHLORIDE 0.9 % IV SOLN
Freq: Once | INTRAVENOUS | Status: AC
  Administered 2018-11-06: 14:00:00 via INTRAVENOUS
  Filled 2018-11-06: qty 250

## 2018-11-06 MED ORDER — SODIUM CHLORIDE 0.9 % IV SOLN
510.0000 mg | Freq: Once | INTRAVENOUS | Status: AC
  Administered 2018-11-06: 510 mg via INTRAVENOUS
  Filled 2018-11-06: qty 17

## 2018-11-13 ENCOUNTER — Other Ambulatory Visit: Payer: Self-pay

## 2018-11-13 ENCOUNTER — Encounter: Payer: Medicare Other | Attending: Physician Assistant | Admitting: Physician Assistant

## 2018-11-13 DIAGNOSIS — I890 Lymphedema, not elsewhere classified: Secondary | ICD-10-CM | POA: Insufficient documentation

## 2018-11-13 DIAGNOSIS — E11621 Type 2 diabetes mellitus with foot ulcer: Secondary | ICD-10-CM | POA: Diagnosis present

## 2018-11-13 DIAGNOSIS — D649 Anemia, unspecified: Secondary | ICD-10-CM | POA: Diagnosis not present

## 2018-11-13 DIAGNOSIS — I110 Hypertensive heart disease with heart failure: Secondary | ICD-10-CM | POA: Diagnosis not present

## 2018-11-13 DIAGNOSIS — E1142 Type 2 diabetes mellitus with diabetic polyneuropathy: Secondary | ICD-10-CM | POA: Insufficient documentation

## 2018-11-13 DIAGNOSIS — L97521 Non-pressure chronic ulcer of other part of left foot limited to breakdown of skin: Secondary | ICD-10-CM | POA: Insufficient documentation

## 2018-11-13 DIAGNOSIS — I509 Heart failure, unspecified: Secondary | ICD-10-CM | POA: Diagnosis not present

## 2018-11-13 DIAGNOSIS — L97511 Non-pressure chronic ulcer of other part of right foot limited to breakdown of skin: Secondary | ICD-10-CM | POA: Insufficient documentation

## 2018-11-13 NOTE — Progress Notes (Signed)
KODAH, MARET ([id number]) Visit Report for 11/13/2018 Arrival Information Details Patient Name: Basquez, JADYN BARGE. Date of Service: 11/13/2018 9:15 AM Medical Record Number: [id number] Patient Account Number: [id number] Date of Birth/Sex: June 12, 1950 (68 y.o. M) Treating RN: Army Melia Primary Care Herndon Grill: Lamonte Sakai Other Clinician: Referring Stefen Juba: Lamonte Sakai Treating Irys Nigh/Extender: Melburn Hake, HOYT Weeks in Treatment: 25 Visit Information History Since Last Visit Added or deleted any medications: No Patient Arrived: Ambulatory Any new allergies or adverse reactions: No Arrival Time: 09:21 Had a fall or experienced change in No Accompanied By: self activities of daily living that may affect Transfer Assistance: None risk of falls: Patient Identification Verified: Yes Signs or symptoms of abuse/neglect since last visito No Secondary Verification Process Completed: Yes Hospitalized since last visit: No Patient Requires Transmission-Based No Implantable device outside of the clinic excluding No Precautions: cellular tissue based products placed in the center Patient Has Alerts: Yes since last visit: Patient Alerts: DMII Has Dressing in Place as Prescribed: Yes Pain Present Now: No Electronic Signature(s) Signed: 11/13/2018 3:35:22 PM By: Lorine Bears RCP, RRT, CHT Entered By: Lorine Bears on 11/13/2018 09:22:56 Isabell, [name] ([id number]) -------------------------------------------------------------------------------- Encounter Discharge Information Details Patient Name: Runnels, Dwyne E. Date of Service: 11/13/2018 9:15 AM Medical Record Number: [id number] Patient Account Number: [id number] Date of Birth/Sex: February 18, 1951 (68 y.o. M) Treating RN: Army Melia Primary Care Eliyas Suddreth: Lamonte Sakai Other Clinician: Referring Syniah Berne: Lamonte Sakai Treating Abrar Koone/Extender: Melburn Hake, HOYT Weeks in Treatment: 111 Encounter Discharge  Information Items Post Procedure Vitals Discharge Condition: Stable Temperature (F): 99.0 Ambulatory Status: Ambulatory Pulse (bpm): 72 Discharge Destination: Home Respiratory Rate (breaths/min): 16 Transportation: Private Auto Blood Pressure (mmHg): 132/71 Accompanied By: self Schedule Follow-up Appointment: Yes Clinical Summary of Care: Electronic Signature(s) Signed: 11/13/2018 11:48:34 AM By: Army Melia Entered By: Army Melia on 11/13/2018 09:56:25 Vandergrift, Yousuf E. ([id number]) -------------------------------------------------------------------------------- Lower Extremity Assessment Details Patient Name: Gentle, Xion E. Date of Service: 11/13/2018 9:15 AM Medical Record Number: [id number] Patient Account Number: [id number] Date of Birth/Sex: Jul 25, 1950 (68 y.o. M) Treating RN: Montey Hora Primary Care Loistine Eberlin: Lamonte Sakai Other Clinician: Referring Siya Flurry: Lamonte Sakai Treating Ermias Tomeo/Extender: Melburn Hake, HOYT Weeks in Treatment: 111 Edema Assessment Assessed: [Left: No] [Right: No] Edema: [Left: Yes] [Right: Yes] Vascular Assessment Pulses: Dorsalis Pedis Palpable: [Left:Yes] [Right:Yes] Electronic Signature(s) Signed: 11/13/2018 4:34:48 PM By: Montey Hora Entered By: Montey Hora on 11/13/2018 09:37:24 Scholze, Rally E. ([id number]) -------------------------------------------------------------------------------- Multi Wound Chart Details Patient Name: Molla, Christophr E. Date of Service: 11/13/2018 9:15 AM Medical Record Number: [id number] Patient Account Number: [id number] Date of Birth/Sex: 05/14/1950 (68 y.o. M) Treating RN: Army Melia Primary Care Yenty Bloch: Lamonte Sakai Other Clinician: Referring Nerine Pulse: Lamonte Sakai Treating Pailynn Vahey/Extender: Melburn Hake, HOYT Weeks in Treatment: 111 Vital Signs Height(in): 69 Pulse(bpm): 72 Weight(lbs): 168 Blood Pressure(mmHg): 132/71 Body Mass Index(BMI): 25 Temperature(F): 99.0 Respiratory  Rate 16 (breaths/min): Photos: [16:No Photos] [18:No Photos] [2:No Photos] Wound Location: [16:Right, Plantar Toe Great] [18:Right, Dorsal Toe Great] [2:Right Toe Second] Wounding Event: [16:Not Known] [18:Gradually Appeared] [2:Gradually Appeared] 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10% Grade 1] Wound Number: [id number]  Photos: No Photos No Photos No Photos Wound Location: Left Toe Second Left Toe Great Right, Distal, Dorsal Foot Wounding Event: Pressure Injury Shear/Friction Gradually Appeared Primary Etiology: Diabetic Wound/Ulcer of 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 Lower Extremity Lower Extremity Date Acquired: 09/22/2018 10/13/2018 10/13/2018 Weeks of Treatment: 7 4 4  Wound Status: Open Open Open Pending Amputation on No No No Presentation: Measurements L x W x D 1.5x1.5x0.1 1.8x0.4x0.1 0.8x0.3x0.1 (cm) Area (cm) : 1.767 0.565 0.188 Volume (cm) : 0.177 0.057 0.019 % Reduction in Area: 55.00% 85.60% 98.20% % Reduction in Volume: 55.00% 85.50% 98.20% Classification: Grade 1 Grade 1 Grade 1 Busby, Jessee E. ([id number]) Treatment Notes Electronic Signature(s) Signed: 11/13/2018 11:48:34 AM By: Army Melia Entered By: Army Melia on 11/13/2018 09:49:39 Beining, [name]  ([id number]) -------------------------------------------------------------------------------- [place] Details Patient Name: Mitter, Dirk E. Date of Service: 11/13/2018 9:15 AM Medical Record Number: [id number] Patient Account Number: [id number] Date of Birth/Sex: [birth year] (68 y.o. M) Treating RN: Army Melia Primary Care Delbra Zellars: Lamonte Sakai Other Clinician: Referring Mindie Rawdon: Lamonte Sakai Treating Pasco Marchitto/Extender: Melburn Hake, HOYT Weeks in Treatment: 22 Active Inactive Abuse / Safety / Falls / Self Care Management Nursing Diagnoses: Potential for falls Goals: Patient will remain injury free related to falls Date Initiated: 09/21/2016 Target Resolution Date: 09/14/2017 Goal Status: Active Interventions: Assess fall risk on admission and as needed Notes: Nutrition Nursing Diagnoses: Potential for alteratiion in Nutrition/Potential for imbalanced nutrition Goals: Patient/caregiver agrees to and verbalizes understanding of need to use nutritional supplements and/or vitamins as prescribed Date Initiated: 09/21/2016 Target Resolution Date: 09/14/2017 Goal Status: Active Interventions: Assess patient nutrition upon admission and as needed per policy Notes: Orientation to the Wound Care Program Nursing Diagnoses: Knowledge deficit related to the wound healing center program Goals: Patient/caregiver will verbalize understanding of the [facility] Date Initiated: 09/21/2016 Target Resolution Date: 06/15/2017 Goal Status: Active Interventions: Provide education on orientation to the wound center Skaff, Almon E. ([id number]) Notes: Wound/Skin Impairment Nursing Diagnoses: Knowledge deficit related to smoking impact on wound healing Goals: Ulcer/skin breakdown will have a volume reduction of 30% by week 4 Date Initiated: 09/21/2016 Target Resolution Date: 08/17/2017 Goal Status: Active Ulcer/skin breakdown will have a volume reduction of 50% by  week 8 Date Initiated: 09/21/2016 Target Resolution Date: 08/17/2017 Goal Status: Active Ulcer/skin breakdown will have a volume reduction of 80% by week 12 Date Initiated: 09/21/2016 Target Resolution Date: 09/14/2017 Goal Status: Active Ulcer/skin breakdown will heal within 14 weeks Date Initiated: 09/21/2016 Target Resolution Date: 08/17/2017 Goal Status: Active Interventions: Assess patient/caregiver ability to obtain necessary supplies Assess patient/caregiver ability to perform ulcer/skin care regimen upon admission and as needed Assess ulceration(s) every visit Notes: Electronic Signature(s) Signed: 11/13/2018 11:48:34 AM By: Army Melia Entered By: Army Melia on 11/13/2018 09:49:23 Pond, Jlyn E. ([id number]) -------------------------------------------------------------------------------- Pain Assessment Details Patient Name: Swader, Ashwath E. Date of Service: 11/13/2018 9:15 AM Medical Record Number: [id number] Patient Account Number: [id number] Date of Birth/Sex: 1951-02-09 (68 y.o. M) Treating RN: Army Melia Primary Care Jaelene Garciagarcia: Lamonte Sakai Other Clinician: Referring Fujie Dickison: Lamonte Sakai Treating Shanti Eichel/Extender: Melburn Hake, HOYT Weeks in Treatment: 111 Active Problems Location of Pain Severity and Description of Pain Patient Has Paino No Site Locations Pain Management and Medication Current Pain Management: Electronic Signature(s) Signed: 11/13/2018 11:48:34 AM By: Army Melia Signed: 11/13/2018 3:35:22 PM By: Lorine Bears RCP, RRT, CHT Entered By: Becky Sax, Amado Nash on 11/13/2018 09:23:04  ANGELO, PRINDLE ([id number]) -------------------------------------------------------------------------------- Patient/Caregiver Education Details Patient Name: Abbs, KOLLYN LINGAFELTER. Date of Service: 11/13/2018 9:15 AM Medical Record Number: [id number] Patient Account Number: [id number] Date of Birth/Gender: 06/14/1950 (68 y.o. M) Treating RN: Army Melia Primary Care Physician: Lamonte Sakai Other Clinician: Referring Physician: Lamonte Sakai Treating Physician/Extender: Sharalyn Ink in Treatment: 111 Education Assessment Education Provided To: Patient Education Topics Provided Wound/Skin Impairment: Handouts: Caring for Your Ulcer Methods: Demonstration, Explain/Verbal Responses: State content correctly Electronic Signature(s) Signed: 11/13/2018 11:48:34 AM By: Army Melia Entered By: Army Melia on 11/13/2018 09:55:21 Annett, [name] ([id number]) -------------------------------------------------------------------------------- Wound Assessment Details Patient Name: Vara, Marcello E. Date of Service: 11/13/2018 9:15 AM Medical Record Number: [id number] Patient Account Number: [id number] Date of Birth/Sex: 05-30-1950 (68 y.o. M) Treating RN: Montey Hora Primary Care Sintia Mckissic: Lamonte Sakai Other Clinician: Referring Hoyt Leanos: Lamonte Sakai Treating Kryslyn Helbig/Extender: Melburn Hake, HOYT Weeks in Treatment: 111 Wound Status Wound Number: 16 Primary Diabetic Wound/Ulcer of the Lower Extremity Etiology: Wound Location: Right Toe Great - Plantar Wound Open Wounding Event: Not Known Status: Date Acquired: 10/29/2017 Comorbid Anemia, Lymphedema, Congestive Heart Weeks Of Treatment: 54 History: Failure, Hypertension, Peripheral Venous Clustered Wound: No Disease, Type II Diabetes, Neuropathy Photos Wound Measurements Length: (cm) 0.9 % Reduct Width: (cm) 1 % Reduct Depth: (cm) 0.1 Epitheli Area: (cm) 0.707 Tunneli Volume: (cm) 0.071 Undermi ion in Area: 55.9% ion in Volume: 55.6% alization: Small (1-33%) ng: No ning: No Wound Description Classification: Grade 1 Foul Odo Wound Margin: Flat and Intact Slough/F Exudate Amount: Large Exudate Type: Serous Exudate Color: amber r After Cleansing: No ibrino No Wound Bed Granulation Amount: Large (67-100%) Exposed Structure Granulation Quality: Pink Fat Layer  (Subcutaneous Tissue) Exposed: Yes Necrotic Amount: None Present (0%) Treatment Notes Wound #16 (Right, Plantar Toe Great) Notes nystatin powder, silver cell, tape Furuta, DEMITRUS FRANCISCO ([id number]) Electronic Signature(s) Signed: 11/13/2018 10:40:46 AM By: Montey Hora Entered By: Montey Hora on 11/13/2018 10:40:45 Aloi, Rand E. ([id number]) -------------------------------------------------------------------------------- Wound Assessment Details Patient Name: Ingber, Chesney E. Date of Service: 11/13/2018 9:15 AM Medical Record Number: [id number] Patient Account Number: [id number] Date of Birth/Sex: [birth year] (68 y.o. M) Treating RN: Montey Hora Primary Care Aneya Daddona: Lamonte Sakai Other Clinician: Referring Charline Hoskinson: Lamonte Sakai Treating Jobany Montellano/Extender: Melburn Hake, HOYT Weeks in Treatment: 111 Wound Status Wound Number: 18 Primary Diabetic Wound/Ulcer of the Lower Extremity Etiology: Wound Location: Right Toe Great - Dorsal Wound Open Wounding Event: Gradually Appeared Status: Date Acquired: 05/22/2018 Comorbid Anemia, Lymphedema, Congestive Heart Weeks Of Treatment: 25 History: Failure, Hypertension, Peripheral Venous Clustered Wound: No Disease, Type II Diabetes, Neuropathy Photos Wound Measurements Length: (cm) 0.2 % Reduct Width: (cm) 0.2 % Reduct Depth: (cm) 0.1 Epitheli Area: (cm) 0.031 Tunneli Volume: (cm) 0.003 Undermi ion in Area: 96.1% ion in Volume: 96.3% alization: Medium (34-66%) ng: No ning: No Wound Description Classification: Grade 1 Foul Odo Wound Margin: Flat and Intact Slough/F Exudate Amount: Large Exudate Type: Serous Exudate Color: amber r After Cleansing: No ibrino No Wound Bed Granulation Amount: Large (67-100%) Exposed Structure Granulation Quality: Pale Fat Layer (Subcutaneous Tissue) Exposed: Yes Necrotic Amount: None Present (0%) Treatment Notes Wound #18 (Right, Dorsal Toe Great) Notes nystatin powder, silver cell,  tape Murata, Asriel E. ([id number]) Electronic Signature(s) Signed: 11/13/2018 10:41:25 AM By: Montey Hora Entered By: Montey Hora on 11/13/2018 10:41:25 Vink, Cornelius E. ([id number]) -------------------------------------------------------------------------------- Wound Assessment Details Patient Name: Tasker, Kaeden E. Date of Service: 11/13/2018 9:15 AM Medical Record Number: [id number] Patient Account Number: [id number] Date of Birth/Sex: Oct 21, 1950 (68  y.o. M) Treating RN: Montey Hora Primary Care Calais Svehla: Lamonte Sakai Other Clinician: Referring Joram Venson: Lamonte Sakai Treating Tylar Amborn/Extender: Melburn Hake, HOYT Weeks in Treatment: 111 Wound Status Wound Number: 2 Primary Diabetic Wound/Ulcer of the Lower Extremity Etiology: Wound Location: Right Toe Second Wound Open Wounding Event: Gradually Appeared Status: Date Acquired: 06/11/2016 Comorbid Anemia, Lymphedema, Congestive Heart Weeks Of Treatment: 111 History: Failure, Hypertension, Peripheral Venous Clustered Wound: No Disease, Type II Diabetes, Neuropathy Pending Amputation On Presentation Photos Wound Measurements Length: (cm) 0.8 % Reduct Width: (cm) 1.6 % Reduct Depth: (cm) 0.1 Epitheli Area: (cm) 1.005 Tunneli Volume: (cm) 0.101 Undermi ion in Area: 67.3% ion in Volume: 67.1% alization: Medium (34-66%) ng: No ning: No Wound Description Classification: Grade 1 Foul Odo Wound Margin: Flat and Intact Slough/F Exudate Amount: Large Exudate Type: Serous Exudate Color: amber r After Cleansing: No ibrino No Wound Bed Granulation Amount: Large (67-100%) Exposed Structure Granulation Quality: Pink Fat Layer (Subcutaneous Tissue) Exposed: Yes Necrotic Amount: None Present (0%) Treatment Notes Wound #2 (Right Toe Second) Notes nystatin powder, silver cell, tape Sciara, Lonza E. ([id number]) Electronic Signature(s) Signed: 11/13/2018 10:42:02 AM By: Montey Hora Entered By: Montey Hora on 11/13/2018  10:42:01 Digman, Jonanthan E. ([id number]) -------------------------------------------------------------------------------- Wound Assessment Details Patient Name: Ruddy, Lanis E. Date of Service: 11/13/2018 9:15 AM Medical Record Number: [id number] Patient Account Number: [id number] Date of Birth/Sex: [birth year] (68 y.o. M) Treating RN: Montey Hora Primary Care Rhetta Cleek: Lamonte Sakai Other Clinician: Referring Anacarolina Evelyn: Lamonte Sakai Treating Can Lucci/Extender: Melburn Hake, HOYT Weeks in Treatment: 111 Wound Status Wound Number: 20 Primary Diabetic Wound/Ulcer of the Lower Extremity Etiology: Wound Location: Left Toe Second Wound Open Wounding Event: Pressure Injury Status: Date Acquired: 09/22/2018 Comorbid Anemia, Lymphedema, Congestive Heart Weeks Of Treatment: 7 History: Failure, Hypertension, Peripheral Venous Clustered Wound: No Disease, Type II Diabetes, Neuropathy Photos Wound Measurements Length: (cm) 1.5 % Reduct Width: (cm) 1.5 % Reduct Depth: (cm) 0.1 Epitheli Area: (cm) 1.767 Tunneli Volume: (cm) 0.177 Undermi ion in Area: 55% ion in Volume: 55% alization: Medium (34-66%) ng: No ning: No Wound Description Classification: Grade 1 Foul Odo Wound Margin: Flat and Intact Slough/F Exudate Amount: Medium Exudate Type: Serous Exudate Color: amber r After Cleansing: No ibrino No Wound Bed Granulation Amount: Large (67-100%) Exposed Structure Granulation Quality: Pink Fat Layer (Subcutaneous Tissue) Exposed: Yes Necrotic Amount: None Present (0%) Treatment Notes Wound #20 (Left Toe Second) Notes nystatin powder, silver cell, tape Lebow, Ashtan E. ([id number]) Electronic Signature(s) Signed: 11/13/2018 10:42:49 AM By: Montey Hora Entered By: Montey Hora on 11/13/2018 10:42:48 Farro, Janet E. ([id number]) -------------------------------------------------------------------------------- Wound Assessment Details Patient Name: Tedesco, Ayub E. Date of Service:  11/13/2018 9:15 AM Medical Record Number: [id number] Patient Account Number: [id number] Date of Birth/Sex: Aug 19, 1950 (68 y.o. M) Treating RN: Montey Hora Primary Care Terrian Ridlon: Lamonte Sakai Other Clinician: Referring Bettie Swavely: Lamonte Sakai Treating Crandall Harvel/Extender: Melburn Hake, HOYT Weeks in Treatment: 111 Wound Status Wound Number: 21 Primary Diabetic Wound/Ulcer of the Lower Extremity Etiology: Wound Location: Left Toe Great Wound Open Wounding Event: Shear/Friction Status: Date Acquired: 10/13/2018 Comorbid Anemia, Lymphedema, Congestive Heart Weeks Of Treatment: 4 History: Failure, Hypertension, Peripheral Venous Clustered Wound: No Disease, Type II Diabetes, Neuropathy Photos Wound Measurements Length: (cm) 1.8 % Reduction in Width: (cm) 0.4 % Reduction in Depth: (cm) 0.1 Epithelializat Area: (cm) 0.565 Tunneling: Volume: (cm) 0.057 Undermining: Area: 85.6% Volume: 85.5% ion: Large (67-100%) No No Wound Description Classification: Grade 1 Foul Odor Afte Wound Margin: Flat and Intact Slough/Fibrino Exudate Amount: Small Exudate Type: Serous Exudate  Color: amber r Cleansing: No No Wound Bed Granulation Amount: Large (67-100%) Exposed Structure Granulation Quality: Pink Fascia Exposed: No Necrotic Amount: None Present (0%) Fat Layer (Subcutaneous Tissue) Exposed: No Tendon Exposed: No Muscle Exposed: No Joint Exposed: No Bone Exposed: No Limited to Skin Breakdown Trainer, Mattthew E. ([id number]) Treatment Notes Wound #21 (Left Toe Great) Notes nystatin powder, silver cell, tape Electronic Signature(s) Signed: 11/13/2018 10:43:30 AM By: Montey Hora Entered By: Montey Hora on 11/13/2018 10:43:29 Witkop, Fadel E. ([id number]) -------------------------------------------------------------------------------- Wound Assessment Details Patient Name: Granja, Renel E. Date of Service: 11/13/2018 9:15 AM Medical Record Number: [id number] Patient Account Number:  [id number] Date of Birth/Sex: December 31, 1950 (68 y.o. M) Treating RN: Montey Hora Primary Care Alpheus Stiff: Lamonte Sakai Other Clinician: Referring Mercedies Ganesh: Lamonte Sakai Treating Angeletta Goelz/Extender: Melburn Hake, HOYT Weeks in Treatment: 111 Wound Status Wound Number: 22 Primary Diabetic Wound/Ulcer of the Lower Extremity Etiology: Wound Location: Right Foot - Dorsal, Distal Wound Open Wounding Event: Gradually Appeared Status: Date Acquired: 10/13/2018 Comorbid Anemia, Lymphedema, Congestive Heart Weeks Of Treatment: 4 History: Failure, Hypertension, Peripheral Venous Clustered Wound: No Disease, Type II Diabetes, Neuropathy Photos Wound Measurements Length: (cm) 0.8 % Reduction i Width: (cm) 0.3 % Reduction i Depth: (cm) 0.1 Epithelializa Area: (cm) 0.188 Tunneling: Volume: (cm) 0.019 Undermining: n Area: 98.2% n Volume: 98.2% tion: Large (67-100%) No No Wound Description Classification: Grade 1 Foul Odor Aft Wound Margin: Flat and Intact Slough/Fibrin Exudate Amount: Large Exudate Type: Serous Exudate Color: amber er Cleansing: No o No Wound Bed Granulation Amount: Large (67-100%) Exposed Structure Granulation Quality: Pink Fascia Exposed: No Necrotic Amount: None Present (0%) Fat Layer (Subcutaneous Tissue) Exposed: No Tendon Exposed: No Muscle Exposed: No Joint Exposed: No Bone Exposed: No Limited to Skin Breakdown Franks, Latif E. ([id number]) Treatment Notes Wound #22 (Right, Distal, Dorsal Foot) Notes nystatin powder, silver cell, tape Electronic Signature(s) Signed: 11/13/2018 10:44:03 AM By: Montey Hora Entered By: Montey Hora on 11/13/2018 10:44:03 Crenshaw, [name] ([id number]) -------------------------------------------------------------------------------- Vitals Details Patient Name: Luecke, Tracer E. Date of Service: 11/13/2018 9:15 AM Medical Record Number: [id number] Patient Account Number: [id number] Date of Birth/Sex: Sep 01, 1950 (68 y.o.  M) Treating RN: Army Melia Primary Care Avri Paiva: Lamonte Sakai Other Clinician: Referring Jeshurun O[place]: Lamonte Sakai Treating Lourie Retz/Extender: Melburn Hake, HOYT Weeks in Treatment: 111 Vital Signs Time Taken: 09:20 Temperature (F): 99.0 Height (in): 69 Pulse (bpm): 72 Weight (lbs): 168 Respiratory Rate (breaths/min): 16 Body Mass Index (BMI): 24.8 Blood Pressure (mmHg): 132/71 Reference Range: 80 - 120 mg / dl Electronic Signature(s) Signed: 11/13/2018 3:35:22 PM By: Lorine Bears RCP, RRT, CHT Entered By: Lorine Bears on 11/13/2018 09:23:30

## 2018-11-13 NOTE — Progress Notes (Addendum)
DOUGLES, KIMMEY ([id number]) Visit Report for 11/13/2018 Chief Complaint Document Details Patient Name: [name], [name] Date of Service: 11/13/2018 9:15 AM Medical Record Number: [id number] Patient Account Number: [id number] Date of Birth/Sex: 06/21/1950 (68 y.o. M) Treating RN: Army Melia Primary Care Provider: Lamonte Sakai Other Clinician: Referring Provider: Lamonte Sakai Treating Provider/Extender: Melburn Hake, HOYT Weeks in Treatment: 111 Information Obtained from: Patient Chief Complaint Patient presents for treatment of an open diabetic ulcer to both feet Electronic Signature(s) Signed: 11/13/2018 9:46:50 AM By: Worthy Keeler PA-C Entered By: Worthy Keeler on 11/13/2018 09:46:49 [name], [name] ([id number]) -------------------------------------------------------------------------------- Debridement Details Patient Name: [name], [name] Date of Service: 11/13/2018 9:15 AM Medical Record Number: [id number] Patient Account Number: [id number] Date of Birth/Sex: [birth year] (68 y.o. M) Treating RN: Army Melia Primary Care Provider: Lamonte Sakai Other Clinician: Referring Provider: Lamonte Sakai Treating Provider/Extender: Melburn Hake, HOYT Weeks in Treatment: 111 Debridement Performed for Wound #21 Left Toe Great Assessment: Performed By: Physician STONE III, HOYT E., PA-C Debridement Type: Debridement Severity of Tissue Pre Fat layer exposed Debridement: Level of Consciousness (Pre- Awake and Alert procedure): Pre-procedure Verification/Time Yes - 09:52 Out Taken: Start Time: 09:53 Pain Control: Lidocaine Total Area Debrided (L x W): 1.8 (cm) x 0.4 (cm) = 0.72 (cm) Tissue and other material Viable, Non-Viable, Callus, Slough, Subcutaneous, Slough debrided: Level: Skin/Subcutaneous Tissue Debridement Description: Excisional Instrument: Curette Bleeding: Minimum Hemostasis Achieved: Pressure End Time: 09:54 Response to Treatment: Procedure was tolerated well Level of  Consciousness Awake and Alert (Post-procedure): Post Debridement Measurements of Total Wound Length: (cm) 1.8 Width: (cm) 0.4 Depth: (cm) 0.1 Volume: (cm) 0.057 Character of Wound/Ulcer Post Debridement: Stable Severity of Tissue Post Debridement: Fat layer exposed Post Procedure Diagnosis Same as Pre-procedure Electronic Signature(s) Signed: 11/13/2018 11:48:34 AM By: Army Melia Signed: 11/14/2018 2:47:02 AM By: Worthy Keeler PA-C Entered By: Army Melia on 11/13/2018 09:54:50 [name], [name] ([id number]) -------------------------------------------------------------------------------- HPI Details Patient Name: [name], [name] Date of Service: 11/13/2018 9:15 AM Medical Record Number: [id number] Patient Account Number: [id number] Date of Birth/Sex: [birth year] (68 y.o. M) Treating RN: Army Melia Primary Care Provider: Lamonte Sakai Other Clinician: Referring Provider: Lamonte Sakai Treating Provider/Extender: Melburn Hake, HOYT Weeks in Treatment: 111 History of Present Illness HPI Description: 68 year old patient here to see as for bilateral feet ulceration to on his left first and second toe and 2 on his right first and second toe, which she's had for about 4 months. 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 Donella Stade. The patient is also undergoing workup by medical oncology for a intra-abdominal lymphadenopathy. 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 He is also status post appendectomy, inguinal lymph node biopsy, rectal biopsy and rectal examination under anesthesia. he currently smokes cigarettes about half packet a day. In March of this year he was seen by Dr. Hortencia Pilar, for evaluation of bilateral varicose veins and besides  wearing compression stockings he had recommended laser ablation of the right and left great saphenous veins to eleviate the symptoms and complications of severe superficial venous reflux disease. He also recommended lymphedema pumps for better control of his lymphedema. The patient recently has had on 08/23/2016, right greater saphenous vein ablation with the laser energy Earlier lower extremity venous reflux examination done on  05/08/2016 showed no DVT or SVT both lower legs but incompetence of bilateral great saphenous veins was present. A lower arterial study was also done and there was no significant right lower and left lower extremity problems based on a normal toe brachial index bilaterally and the ABI was 1.21 the left and 1.23 on the right. His post ablation venous duplex examination showed successful ablation of the right GS vein with thrombus formation 2 below the right saphenofemoral junction. The deep system was patent without evidence of thrombosis and this was done on 08/30/2016. 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 Addendum: regarding his x-rays done today and x-ray of the left foot -- IMPRESSION: No objective evidence of osteomyelitis. There are soft tissue changes which may reflect cellulitis. X-ray of the right foot -- IMPRESSION:Findings compatible with cellulitis of the toes. No objective evidence of osteomyelitis is observed. 10/01/16 on evaluation today patient's wounds appeared to be doing some better. I did review the x-rays as well which showed no evidence of osteomyelitis although there was evidence on x-ray of cellulitis. He fortunately is not having any discomfort although he continues to have some swelling. He does not remember being on any antibiotics recently. 10/15/16 on evaluation today patient's wounds overall appear to be doing better although he  does have a new location noted on the left foot. Fortunately he is not having significant pain. It almost has the appearance that something is rubbing on the end of his toes but he wears the open toe shoes and according to what he is telling me never wears anything that would rub on his foot. There is no evidence of infection and specifically no evidence of a fungal infection 10/22/16 On evaluation today patient's wounds appeared to be doing better compared to last week in regard to his bilateral lower extremities. Fortunately I happy with how things are progressing although he still has ulcers I feel like that he is improving and appropriate manner. 11/12/16 on evaluation today patient appears to be doing well in regard to his bilateral feet and the respective wounds. We have been using surrounding her dressings along with an antifungal cream which seems to be doing very well. He has no bilateral dysfunction noticed that the rituals are weight loss at this point. He also has no nausea or vomiting a note purulent discharge. He did see Vein and vascular today and he tells me that they told him he could have surgery for his venous stasis but they did not feel like it was worth it in his words. Fortunately patient's wounds do appear to be getting sneakily better. [name], [name] ([id number]) 11/26/2016 -- he says he is going to have some surgery during this week at [facility] for possibly a colon resection. 12/31/2016 -- the patient has been noncompliant with his smoking and I'm not sure whether he is also started drinking again. He continues to be very nonchalant about his care 01/14/2017 -- the patient's HandP has been reviewed well and I understand he is being compliant with trying to give up smoking and his local dressing changes. He does not have any surgical options of 4 to him by his vascular surgeons.he was last seen in early August by Dr. Hortencia Pilar who recommended compression  stockings,and possibly lymph pumps in 2-3 months after doing a review ultrasound. 01/28/2017 - the patient did not have any fresh complaints  but on examination I noted a large lacerated wound on the plantar aspect of his right fourth toe which had a lot of necrotic debris and it probes down to bone. 02/07/2017 -- x-ray of the right foot -- IMPRESSION: Soft tissue swelling about the first through fourth toes consistent with cellulitis. New destructive change in the tuft of the distal phalanx of the great toe is consistent with osteomyelitis. 02/14/2017 -- the patient's MRI is pending this coming Monday and he still continues to smoke. We have again gone over off loading of his wounds in great detail and he says he's been compliant. 02/21/2017 -- MR of the right foot -- IMPRESSION: 1. Soft tissue ulcer at the tip of the first, second and third toe knows. Cortical irregularity and bone marrow edema in the first distal phalanx most concerning for osteomyelitis. Mild marrow edema in the second and third distal phalanx without definite cortical destruction which may reflect early osteomyelitis versus reactive marrow edema. 2. Soft tissue edema surrounding the first phalanx most consistent with cellulitis. the patient was also recently evaluated by his medical oncologist Dr. 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 She is treating him with weekly Procrit. She is also keeping intra-abdominal lymphadenopathy and right lower lobe lung nodule under observation. 04/04/2017 -- he was seen by Dr. Adrian Prows on 03/25/2017 -- after review he empirically put him on ciprofloxacin and doxycycline as they have good bone penetration and good bioavailability and it will cover the usual pathogens and diabetic foot osteomyelitis. He will check inflammatory markers and plan a 23-68 week old records. C-reactive protein was 0.3 and the ESR was  72 04/18/17 on evaluation today patient appears to be doing about the same in regard to his lower extremity wounds bilaterally. He has continued to use the antifungal cream which does seem to be beneficial. Nonetheless the ulcers do seem to in some areas be epithelial eyes over and in other areas are still open. He is having no significant discomfort. 04/25/17-he is here in follow-up evaluation for multiple ulcerations to multiple toes bilaterally. He states he did see Dr. Ola Spurr again last week and continues antibiotic therapy. He is voicing no complaints or concerns, will continue with current treatment plan will possibility of adding compression therapy next week after an additional week of treatment/lotions to BLE prescribed by Dr Ola Spurr 05/02/17 he is here in follow up for for multiple ulcers to multiple toes bilaterally. we will stop using antifungal cream and will continue with silvercel and follow up next week 05/09/17-he is here in follow-up for multiple ulcerations to multiple toes bilaterally. There is improvement in appearance. He has not completely stopped using antifungal cream, but admits he has not using it between the toes. He has an appointment with Dr. Ola Spurr on 2/11, continues on doxycycline and Cipro. It has been 5 weeks of antibiotic therapy, we will order plain film xray to evaluate for osteomyelitis next week, prior to follow up with ID. Will continue with silvercel and follow up next week 05/16/17-he is here in follow-up evaluation for multiple ulcerations to multiple toes bilaterally and new wound to the right posterior heel. There is essentially no change in appearance, deteriorating measurements; he has a history of waxing and waning measurements. He admits that he continues to apply moisturizer/cream/ointment to his toes despite weekly reminders to only apply silvercel to his toes. He states that he thinks the surgical shoe contributed to the  superficial ulcer to  his posterior heel, he is unable to articulate if this was an area of dry cracked skin as he has a similar area to the left heel. He now is wearing open toed slippers. He has an appointment with Dr. Ola Spurr on 2/11. We have ordered x-rays for her bilateral feet; he was advised to obtain the x-rays today or tomorrow. He will follow-up next week 05/23/17-he is here in follow-up evaluation for multiple ulcerations to multiple toes bilaterally and the right posterior heel. There is improvement in maceration. He has been compliant and not applying any moisturizing agent to his toes. He has been using Lac-Hydrin for his lower extremities with improvement. He did not go to his appointment on Monday with Dr. Ola Spurr secondary to financial concerns. X-rays for her bilateral feet showed: LEFT FOOT with slight erosion of the tuft of the distal phalanges of the left first and second toe suspicious for osteomyelitis, RIGHT FOOT with 1.erosion of the tufts of the distal Counsell, Vraj E. ([id number]) phalanges of the right first second and possibly third toes consistent with osteomyelitis, 2 no definitive abnormality of the calcaneus is seen on the images obtained, 3. Plantar calcaneal degenerative spur. We briefly discussed hyperbaric adjunctive therapy for treatment of chronic refractory osteomyelitis. I do not find an a1c in EMR, will contact PCP for record, or order if needed. He has been encouraged to contact Dr Ola Spurr office regarding the follow-up appointment, encouraged him to inquire about payment plan. We will continue with same treatment plan and follow-up next week. He states he is still taking antibiotics and has "a lot" left. He states he has been taking them as directed, 2 pills twice daily. According to Dr. Blane Ohara office notes he was originally started on 12/17 for 4 weeks and extended on 1/14 for an additional 4 weeks. He should be done with his antibiotic therapy, he was advised to  bring his bottles and to his next appointment, we will contact pharmacy. 05/30/17-he is here in follow-up evaluation for multiple ulcerations to multiple toes bilaterally and the right posterior heel. He is accompanied by his brother-in-law. Wounds are stable. He has yet to make up with Dr. Ola Spurr. We contacted his PCP, with no record of recent A1c we will draw an A1c. His brother-in-law states that he was taken off all of his diabetic medication secondary to kidney function. 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 The antibiotics were counted and have approximately 2 weeks left, although they should be complete. The pill organizer reveals missing days. We discussed the need for consistent medications, to have optimal benefit of medication. He has a cousin that lives with him and he will ask her to check his organizer daily. He has been advised to follow up with Dr Ola Spurr, and will go by the office today. He has been advised to quit smoking. 06/06/17-he is here in follow up evaluation. He has had to make an appointment with Dr. Ola Spurr. He did have blood work obtained, a1c 5. He continues to take antibiotic therapy. Significant improvement in bilateral lower extremity edema with compression therapy. Essentially no change in ulcerations to toes. He states he is "going to try something different" and "let me know next week" if it works; he would not provide any additional information and was encouraged to follow our orders. We will follow up next week 06/13/17-he is here in follow-up evaluation. He has an  appointment with Dr. Ola Spurr tomorrow morning. He states he purchased an ointment from [facility] and applied to his toes for 3 days, he does not remember the name of the ointment. There is improvement to his wounds, minimal maceration. He continues to take antibiotic therapy, this should  have been completed last month. His brother-in-law who regularly accompanies his appointments was asked to take the bottles to the appointment tomorrow with Dr. Ola Spurr so he is aware. We will continue with 3 layer compression, and order OPEN TOE compression 20-30mmHg; we will apply compression stockings next week. He continues to smoke, smoked "2 cigarettes" last week 06/20/17 on evaluation today patient did receive his compression stockings which he has with him today for both lower extremities. With that being said he tells me at this point in time that he is very happy to have these he really is not a big fan of the compression wraps that we have been utilizing although they have been of great benefit for him. Nonetheless at this point he does want to switch to the compression stockings. In my opinion as long as he is continuing with compression I'm okay with the stockings or the wraps. 06/27/17-he is here in follow-up evaluation for multiple ulcerations to his bilateral toes. There is some improvement in appearance. He is compliant in wearing his compression stockings with significant improvement in lower extremity edema. He saw Dr Ola Spurr on 3/8, per his notes they would redraw ESR and CRP; plan to continue antibiotic therapy if these remain elevated. I do not see an ESR or CRP level in Epic. The patient continues to take antibiotics. 07/11/17-He is here in follow-up evaluation for multiple ulcerations to multiple toes bilaterally. He presents with complete epithelialization to the right third toe; there has been no deterioration. He continues on antibiotic therapy. He will follow-up next week 07/18/17-He is here in follow-up evaluation for multiple ulcerations to multiple toes bilaterally. He continues to make improvement. He continues on antibiotic therapy. He states he has been using something additional to our orders, he does not elaborate but states he will bring it in next  week. 07/25/17-He is here in follow up evaluation for multiple ulcerations to bilateral toes. He is stable. He has completed antibiotic therapy. He admits to "filing" his toes after showers each evening, this is what he was referencing last week; he does not filing for the wounds. We will switch to [facility] and monitor for any improvement, he will follow-up next week 08/01/17-He is here in follow-up evaluation. He admits to "picking" at his toes after cleansing yesterday, leading to new areas of tissue loss on the bilateral second toe. There is improvement noted to the bilateral great toe. We will dress toes today and hope that they maintain until Monday where he will come in for a nurse visit. He has been advised, multiple times with expressed verbalization, to change the dressings to silvercel if the dressings get wet prior to [facility] appointment. 08/08/17-He is here in follow-up evaluation for bilateral first and second toe ulcerations. There is significant improvement to all ulcerations since last visit. We will switch to [facility] to all wounds and he will continue with nurse visits on a Monday/Thursday schedule and follow-up with me in 2 weeks. He continues to smoke, 1-3 cigarettes per day, and has been encouraged to not smoke until his next follow-up in 2 weeks. 08/15/17 on evaluation today patient's ulcers on his toes actually appear to be doing fairly well the right to ulcers  may be a little bit more moist compared to the left I'm not really sure exactly why as the openings appear to be very small and I'm not seeing any evidence of anything significant as far as you lightest or otherwise. Nonetheless this may just be a small setback he's [name], [name] ([id number]) been doing very well with the [facility] Dressing 08/22/17-He is here in follow-up evaluation for ulcerations to his bilateral first and second toes, there is small/scant amount of drainage noted on today's dressing. He  continues with Hydrofera Blue. He continues to smoke, 1 cigarette a day. Voices no complaint or concerns, compliant and compression therapy today. He'll follow-up next week 08/29/17-He is here in follow-up evaluation for ulcerations to his bilateral first and second toes, with a new wound to the left third toe. He states he cut himself while cutting the toenail. He presents today with more maceration and increased measurements; waxing and waning measurements and moisture has been an ongoing issue. He continues to smoke a proximally 1 cigarette per day, but states he has not smoked since Sunday. We will initiate medihoney daily and evaluate next week. He presents today without compression stockings 09/05/17-He is here in follow-up evaluation for ulcerations to bilateral first and second and left third toe. He did not pick up the medihoney and therefore has not been changing his dressing. He admits to inconsistent where of compression stockings, admits to pain to his bilateral lower extremities with unilateral edema (right greater than left). The ulcerations to multiple toes are more macerated and larger in size than last week. He is wearing compression therapy today. He admits to an overall feeling of weakness and fatigue and has been encouraged to contact his PCP for evaluation; he has a known history of anemia and intraabdominal lymphadenopathy. We will return to silvercel and he will follow up next week 09/19/17-He is here in follow-up evaluation for ulcerations to bilateral first and second toe.he was unable to make last week's appointment. The culture that was obtained on 5/30 grew clindamycin sensitive MRSA; he was initiated on 6/4 but did not start it until 6/7. There is significant improvement in all wounds, healing to left second and third toe. He also admits to having no alcohol or smoking for the last 6 days. We will continue with same treatment plan I will extend the clindamycin for  an additional 10 days and he will follow-up next week. 09/26/17- He is here in follow evaluation for multiple ulcerations to multiple toes bilaterally. He continues to be non-compliant with dressing, compression therapy. He states he is taking the antibiotics as prescribed, although his recall is inconsistent. There is noted deterioration in all ulcers. We will continue same treatment plan, antibiotic therapy and he will follow up next week. 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 This information is obtained from the medical record, as he cannot articulate specifics to his diagnosis or treatment plan. 10/17/17-He is here in follow-up evaluation for multiple ulcerations to multiple toes bilaterally. There is chronic waxing and waning improvement/deterioration. On today's exam they appear improved, dry without maceration. He continues to apply a variety of topical agents, stating that he has switched between the blue product and silver product. In my opinion, this will be a chronic waxing and waning giving his intermittent compliance. He will follow-up in  2 weeks. As it relates to the biopsy taken from his buttock, he has not followed up with that and has not started radiation therapy. 10/31/17-He is here for evaluation for multiple ulcerations to multiple toes bilaterally. Continues with waxing and waning improvement versus deterioration. They are stable on today's exam without significant maceration. 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 We will continue with every 2 week follow-up appointments 11/14/17- He is seen in follow-up evaluation for multiple ulcerations to multiple bilateral toes. He is scheduled for excision of basal cell carcinoma to his right buttock on Monday 8/12. He continues  to have chronic waxing and waning of his ulcers; today is a stable day with minimal drainage and left ear is better than right toes. I will recount to infectious disease regarding chronic suppressive therapy. He admits to being more compliant with compression stockings, although he is not wearing any today. He has been encouraged to continue with dressing changes as ordered and compression therapy daily. He will be seen in 2 weeks 11/28/17-He is seen in follow up evaluation for multiple ulcerations to multiple toes bilaterally. He had an overnight admission at [facility] (8/11-8/12) for symptomatic anemia, received two units prbc. His surgical date has been moved to 9/94 excision of basal cell carcinoma to the right buttock. He has been noncompliant in wearing compression stockings, applying topical treatment as ordered; he has been applying a "cream", Hydrofera Blue, silvercel. He is currently on no antibiotic therapy; we will send for re-referral to ID for suppressive therapy given his multiple co-morbidities, poor surgical candidate, unwilling to have toe amputation and general non-adherence. 12/19/17 on evaluation today patient actually appears to be doing very well in regard to his toes at least compared to last time I saw him. He again has been coming to our office for quite a bit of time. 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 He states is hurting a little bit but doing well in general. I'm see were not taking care of this area dermatology is. He does have his appointment with infectious disease upcoming which they re-consult for suppressive therapy that's next Monday. 01/02/18 on evaluation today patient actually appears to be doing rather well in regard to his toe ulcers although he tells me he has been put in an ointment on this which I am concerned may be causing some  additional moisture buildup which we really do not want. He supposed be using silver cell which is mainly thing I want him to be utilizing. Fortunately there does not appear to be any evidence of worsening infection which is good news. He has had surgery on his gluteal region where he had an excision of a cancerous area fortunately that seems to be doing well although he is having some discomfort. 02/06/18 on evaluation today patient actually appears to be doing well at all locations except for his second toe use bilaterally [name], [name] ([id number]) where the distal tip is still wrong open. He did state that he can't stand for dry skin to buildup on his toes subsequently as a question and further it sounds as if he actually pills this all which is likely keeping the toe is wrong and open. Also think that's what happened in the bottom of his foot although that appears to have healed at this point. There does not appear to be any  evidence of infection. 02/20/18 on evaluation today patient appears to be doing better in regard to his foot ulcers. Everything has shown signs of improvement which is excellent news. Overall I'm very pleased with how things appear currently. The patient states he's not picking her point at any skin and that he wears shoes at all times when he is ambulating at home. Both are things that I've advocated and recommended for him in the past. 03/13/18 on evaluation today patient actually appears to be doing very well in regard to the bilateral toes in the overall appearance of the wound bed locations. Fortunately there does not appear to be any signs of infection at this time. He states that he is very pleased with how things seem to be progressing to be honest as am I. 03/27/1899 evaluation today patient appears to be doing well in regard to his toe ulcers. He just has a few areas remaining and these appear to be showing signs of improvement as well which is great news.  Overall I have been impressed with the progress of the past several weeks. 04/10/18 on evaluation today patient appears to be doing a little bit worse in regard to the right plantar foot at the regional the first metatarsal as well as the left second toe. He tells me that he ran out of bandages he also tells me he's been wearing his flip-flops a lot at home as well as a new pair of boots that he received as well. This is as opposed to the postop shoes that we had previously given him. Nonetheless overall I feel like that he has had a little bit more friction to the area which is causing things to look a little bit worse today. 04/24/18 on evaluation today patient's wounds for the most part appear to be doing excellent especially in regard to the bilateral second toes. With that being said he does have issues with the wound still on the first metatarsal region and first toe of the right foot which have not completely closed at this point. Nonetheless I do feel like that he is making progress which is good news. 05/08/18 on evaluation today patient's wounds appear to be doing just a little bit worse than normal simply due to the fact that he has not had dressing supplies. He apparently owed $35 in order for them to be shipped and unfortunately is not able to pay that currently. We did give him information for the [facility] today which will hopefully help cover his dressing supplies. He was thankful for this he states is gonna contact them this afternoon. Fortunately otherwise or does not hear any signs of infection. 05/22/18 on evaluation today patient appears to be doing somewhat poorly in regard to his toes. Upon further questioning it appears that he actually has been pulling skin off of the toes which "drive him crazy". Subsequently as he does this he calls his new areas to tear open and I think this is one of the reasons why his toes are not healing as appropriately as we  would like. He fortunately has no signs of infection at this time. With that being said I'm still concerned about the possibility of a fungal infection even though there doesn't appear to be anything bacterial. Potentially nystatin powder could be of benefit for him as far as trying to help this in that regard. 06/05/18 on evaluation today patient actually appears to be doing very well in regard to his foot ulcers. Fortunately  there's no signs of infection. He's been tolerating the dressing changes without complication. I'm very pleased with how things seem to be progressing. Fortunately there's no evidence of active infection at this point I do believe nystatin powder has been of benefit for him. 06/19/18 on evaluation today patient actually appears to be doing a little worse compared to last evaluation. Tells me that he has not had any supplies for two weeks. With that being said his plantar foot wounds on the right actually appear to be doing very well. It's actually a second toes in the first toes that are the worst. I think these are areas the was able to get to and pick and he tells me in fact that he destine to pick out some of the dry skin because it causes him annoyance. Nonetheless I think this is somewhat detrimental to him I think it's worth keeping part of the wound open in particular. Fortunately there is no sign of infection at this time although I think that is a risk which I discussed specifically that he's not careful you can end up reopening this and causing greater problems for himself. 07/03/18 on evaluation today patient's wound bed actually shows evidence of good granulation at this time and epithelialization as far as the wounds in general are concerned. He has been tolerating the dressing changes including the nystatin without complication. This is good news. With that being said he seems to be still having some trouble with the left second toe distally at this point. 4/9;  most of the wounds look quite satisfactory. He requires debridement of the plantar right great toe otherwise everything [name], [name] ([id number]) looks superficial to improved. His left second toe is healed. 08/14/18 on evaluation today patient actually appears to be doing very well in regard to his foot ulcerations. In fact he just has three small areas of the right foot still remaining open the left foot is closed and dry at this point. Overall I feel like he is doing quite well which is excellent news. Fortunately there's no signs of active infection. 08/28/18 on evaluation today patient's toes of the right foot actually shows signs of being a little bit worse in regard to the overall open areas at this point. He does tell me that he "washes them well" when he is taking a shower. Again I explained that I want to make sure he's not washing them to aggressively as this could potentially cause issues with overall worsening. 09/12/18 on evaluation today patient actually appears to be doing well in regard to his right foot ulcers. He's been tolerating the dressing changes without complication. Fortunately there's no signs of active infection at this time. With that being said he does have a new one on the dorsal surface of his foot just proximal to his toes he does not know really where this came from. 09/25/18 on evaluation today patient actually appears to be doing somewhat better in regard to his right foot ulcers although he has a new opening on the left second toe unfortunately. This is something that seems to be back and forth he tells me did Pickett scan at this area that is why it's now open yet again. No fevers, chills, nausea, or vomiting noted at this time. 10/16/18 on evaluation today patient actually appears to be doing decently poorly in regard to his ulcers on his feet. He tells me that for the past 3-4 days he is not having to take therefore is not been  putting on any dressings he came in  with the socks actually just stuck to the toes with a been draining. He also has not picked up the nystatin powder that was sent to the pharmacy with refills all he has to do is go pick that up he has not done so. He also told me for the more that he feels like his "blood is low" which seems to be referring to his hemoglobin [facility] and subsequently states he needs to go there when he leaves here in order to see about a blood transfusion. Nonetheless I think that's something that he definitely needs to get on top of if he feels like that's necessary. Fortunately there's no signs of active infection at this time. 10/30/18 on evaluation today patient appears to be doing a little worse in regard to his lower extremity ulcers specifically his toes for the most part although he does have an open wound on the right dorsal foot. He has been picking at his toes which damages the skin and leads to what we are seeing today. Fortunately there is no signs of active infection systemically or locally. With that being said I am concerned about the fact that I'm not sure he's ever really gonna feel as long as he keeps messing with his toes such as picking and bothering them which causes them to reopen on a regular basis. 11/13/18 upon inspection today patient appears to be doing somewhat more poorly currently compared to the last time I saw him. He still has not been wearing his compression stockings even if he did they do not cover the toes which I think may be more of a complication problem for him to be honest. He many compression stockings that do indeed cover his toes but he also needs to have the willpower to actually wear these in a regular basis so that they actually do him any good at all. He also tells me that he is out of dressing supplies and that he needs to go pick some up so that he will have them. Same is true of the powder although he tells me it's at the  pharmacy he just has to go pick it up. Electronic Signature(s) Signed: 11/14/2018 2:47:02 AM By: Worthy Keeler PA-C Entered By: Worthy Keeler on 11/14/2018 02:31:51 Crilly, [name] ([id number]) -------------------------------------------------------------------------------- Physical Exam Details Patient Name: Viscardi, Davidjames E. Date of Service: 11/13/2018 9:15 AM Medical Record Number: [id number] Patient Account Number: [id number] Date of Birth/Sex: [birth year] (68 y.o. M) Treating RN: Army Melia Primary Care Provider: Lamonte Sakai Other Clinician: Referring Provider: Lamonte Sakai Treating Provider/Extender: Melburn Hake, HOYT Weeks in Treatment: 111 Constitutional Well-nourished and well-hydrated in no acute distress. Respiratory normal breathing without difficulty. clear to auscultation bilaterally. Cardiovascular regular rate and rhythm with normal S1, S2. Psychiatric this patient is able to make decisions and demonstrates good insight into disease process. Alert and Oriented x 3. pleasant and cooperative. Notes Upon inspection patient has definite issues with his toes as far as swelling is concerned as well as his lower extremities. He has been using the powder he tells me although he is out right now we've also been utilizing surrounded dressing that he states the one that we have here in the clinic is better than what he has at home. Electronic Signature(s) Signed: 11/14/2018 2:47:02 AM By: Worthy Keeler PA-C Entered By: Worthy Keeler on 11/14/2018 02:32:40 Persad, [name] ([id number]) --------------------------------------------------------------------------------  Physician Orders Details Patient Name: Taormina, LAURENS MATHENY. Date of Service: 11/13/2018 9:15 AM Medical Record Number: [id number] Patient Account Number: [id number] Date of Birth/Sex: 13-Jan-1951 (68 y.o. M) Treating RN: Army Melia Primary Care Provider: Lamonte Sakai Other Clinician: Referring Provider: Lamonte Sakai Treating Provider/Extender: Melburn Hake, HOYT Weeks in Treatment: 111 Verbal / Phone Orders: No Diagnosis Coding ICD-10 Coding Code Description L97.521 Non-pressure chronic ulcer of other part of left foot limited to breakdown of skin L97.511 Non-pressure chronic ulcer of other part of right foot limited to breakdown of skin E11.621 Type 2 diabetes mellitus with foot ulcer I87.323 Chronic venous hypertension (idiopathic) with inflammation of bilateral lower extremity I89.0 Lymphedema, not elsewhere classified F17.218 Nicotine dependence, cigarettes, 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 abuse 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, right ankle and foot Wound Cleansing Wound #16 Right,Plantar Toe Great o Clean wound with Normal Saline. Wound #18 Right,Dorsal Toe Great o Clean wound with Normal Saline. Wound #2 Right Toe Second o Clean wound with Normal Saline. Wound #20 Left Toe Second o Clean wound with Normal Saline. Wound #21 Left Toe Great o Clean wound with Normal Saline. Wound #22 Right,Distal,Dorsal Foot o Clean wound with Normal Saline. Anesthetic (add to Medication List) Wound #16 Right,Plantar Toe Great o Topical Lidocaine 4% cream applied to wound bed prior to debridement (In Clinic Only). Wound #18 Right,Dorsal Toe Great o Topical Lidocaine 4% cream applied to wound bed prior to debridement (In Clinic Only). Wound #2 Right Toe Second o Topical Lidocaine 4% cream applied to wound bed prior to debridement (In Clinic Only). Wound #20 Left Toe Second o Topical Lidocaine 4% cream applied to wound bed prior to debridement (In Clinic Only). BENCE, TRAPP ([id number]) Wound #21 Left Toe Great o Topical Lidocaine 4% cream applied to wound bed prior to debridement (In Clinic Only). Wound #22 Right,Distal,Dorsal Foot o Topical Lidocaine 4% cream applied to wound bed prior to debridement  (In Clinic Only). Skin Barriers/Peri-Wound Care Wound #16 Right,Plantar Toe Great o Antifungal powder-Nystatin Wound #18 Right,Dorsal Toe Great o Antifungal powder-Nystatin Wound #2 Right Toe Second o Antifungal powder-Nystatin Wound #20 Left Toe Second o Antifungal powder-Nystatin Wound #21 Left Toe Great o Antifungal powder-Nystatin Wound #22 Right,Distal,Dorsal Foot o Antifungal powder-Nystatin Primary Wound Dressing Wound #16 Right,Plantar Toe Great o Silver Alginate - secure with tape Wound #18 Right,Dorsal Toe Great o Silver Alginate - secure with tape Wound #2 Right Toe Second o Silver Alginate - secure with tape Wound #20 Left Toe Second o Silver Alginate - secure with tape Wound #21 Left Toe Great o Silver Alginate - secure with tape Wound #22 Right,Distal,Dorsal Foot o Silver Alginate - secure with tape Dressing Change Frequency Wound #16 Right,Plantar Toe Great o Change dressing every other day. Wound #18 Right,Dorsal Toe Great o Change dressing every other day. Wound #2 Right Toe Second o Change dressing every other day. Wound #20 Left Toe Second Esson, Myers E. ([id number]) o Change dressing every other day. Wound #21 Left Toe Great o Change dressing every other day. Wound #22 Right,Distal,Dorsal Foot o Change dressing every other day. Follow-up Appointments Wound #16 Right,Plantar Toe Great o Return Appointment in 1 week. Wound #18 Right,Dorsal Toe Great o Return Appointment in 1 week. Wound #2 Right Toe Second o Return Appointment in 1 week. Wound #20 Left Toe Second o Return Appointment in 1 week. Wound #21 Left Toe Great o Return Appointment in 1 week. Wound #22 Right,Distal,Dorsal Foot o Return Appointment in 1  week. Edema Control Wound #16 Right,Plantar Toe Great o Patient to wear own compression stockings Wound #18 Right,Dorsal Toe Great o Patient to wear own compression stockings Wound  #2 Right Toe Second o Patient to wear own compression stockings Wound #20 Left Toe Second o Patient to wear own compression stockings Wound #21 Left Toe Great o Patient to wear own compression stockings Wound #22 Right,Distal,Dorsal Foot o Patient to wear own compression stockings Additional Orders / Instructions Wound #16 Right,Plantar Toe Great o Stop Smoking o Increase protein intake. Wound #18 Right,Dorsal Toe Great o Stop Smoking o Increase protein intake. Wound #2 Right Toe Second Biel, Hermilo E. ([id number]) o Stop Smoking o Increase protein intake. Wound #20 Left Toe Second o Stop Smoking o Increase protein intake. Wound #21 Left Toe Great o Stop Smoking o Increase protein intake. Wound #22 Right,Distal,Dorsal Foot o Stop Smoking o Increase protein intake. Medications-please add to medication list. Wound #16 Right,Plantar Toe Great o Other: - Nystatin Powder Wound #18 Right,Dorsal Toe Great o Other: - Nystatin Powder Electronic Signature(s) Signed: 11/13/2018 11:48:34 AM By: Army Melia Signed: 11/14/2018 2:47:02 AM By: Worthy Keeler PA-C Entered By: Army Melia on 11/13/2018 10:23:42 Zirkelbach, [name] ([id number]) -------------------------------------------------------------------------------- Problem List Details Patient Name: Michaelis, Ori E. Date of Service: 11/13/2018 9:15 AM Medical Record Number: [id number] Patient Account Number: [id number] Date of Birth/Sex: December 28, 1950 (68 y.o. M) Treating RN: Army Melia Primary Care Provider: Lamonte Sakai Other Clinician: Referring Provider: Lamonte Sakai Treating Provider/Extender: Melburn Hake, HOYT Weeks in Treatment: 111 Active Problems ICD-10 Evaluated Encounter Code Description Active Date Today Diagnosis L97.521 Non-pressure chronic ulcer of other part of left foot limited to 09/21/2016 No Yes breakdown of skin L97.511 Non-pressure chronic ulcer of other part of right foot  limited to 09/21/2016 No Yes breakdown of skin E11.621 Type 2 diabetes mellitus with foot ulcer 09/21/2016 No Yes I87.323 Chronic venous hypertension (idiopathic) with inflammation of 09/21/2016 No Yes bilateral lower extremity I89.0 Lymphedema, not elsewhere classified 09/21/2016 No Yes F17.218 Nicotine dependence, cigarettes, with other nicotine-induced 09/21/2016 No Yes disorders F10.19 Alcohol abuse with unspecified alcohol-induced disorder 09/21/2016 No Yes M86.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, right ankle and foot 02/21/2017 No Yes Inactive Problems Resolved Problems Bolser, WILDER KUROWSKI ([id number]) Electronic Signature(s) Signed: 11/13/2018 9:46:40 AM By: Worthy Keeler PA-C Entered By: Worthy Keeler on 11/13/2018 09:46:39 Lepp, [name] ([id number]) -------------------------------------------------------------------------------- Progress Note Details Patient Name: Cassell, Ayman E. Date of Service: 11/13/2018 9:15 AM Medical Record Number: [id number] Patient Account Number: [id number] Date of Birth/Sex: Dec 13, 1950 (68 y.o. M) Treating RN: Army Melia Primary Care Provider: Lamonte Sakai Other Clinician: Referring Provider: Lamonte Sakai Treating Provider/Extender: Melburn Hake, HOYT Weeks in Treatment: 111 Subjective Chief Complaint Information obtained from Patient Patient presents for treatment of an open diabetic ulcer to both feet History of Present Illness (HPI) 68 year old patient here to see as for bilateral feet ulceration to on his left first and second toe and 2 on his right first and second toe, which she's had for about 4 months. 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 Donella Stade. The patient is also undergoing workup by medical oncology for a intra-abdominal lymphadenopathy. 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 He is also status post appendectomy, inguinal lymph node biopsy, rectal biopsy  and rectal examination under anesthesia. he currently smokes cigarettes about half packet a day. In March of this year he was seen by Dr. Hortencia Pilar, for evaluation of bilateral varicose veins and besides wearing compression stockings he had recommended laser ablation of the right and left great saphenous veins to eleviate the symptoms and complications of severe superficial venous reflux disease. He also recommended lymphedema pumps for better control of his lymphedema. The patient recently has had on 08/23/2016, right greater saphenous vein ablation with the laser energy Earlier lower extremity venous reflux examination done on 05/08/2016 showed no DVT or SVT both lower legs but incompetence of bilateral great saphenous veins was present. A lower arterial study was also done and there was no significant right lower and left lower extremity problems based on a normal toe brachial index bilaterally and the ABI was 1.21 the left and 1.23 on the right. His post ablation venous duplex examination showed successful ablation of the right GS vein with thrombus formation 2 below the right saphenofemoral junction. The deep system was patent without evidence of thrombosis and this was done on 08/30/2016. 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 Addendum: regarding his x-rays done today and x-ray of the left foot -- IMPRESSION: No objective evidence of osteomyelitis. There are soft tissue changes which may reflect cellulitis. X-ray of the right foot -- IMPRESSION:Findings compatible with cellulitis of the toes. No objective evidence of osteomyelitis is observed. 10/01/16 on evaluation today patient's wounds appeared  to be doing some better. I did review the x-rays as well which showed no evidence of osteomyelitis although there was evidence on x-ray of cellulitis. He fortunately is not having any discomfort although he continues to have some swelling. He does not remember being on any antibiotics recently. 10/15/16 on evaluation today patient's wounds overall appear to be doing better although he does have a new location noted on the left foot. Fortunately he is not having significant pain. It almost has the appearance that something is rubbing on the end of his toes but he wears the open toe shoes and according to what he is telling me never wears anything that would rub on his foot. There is no evidence of infection and specifically no evidence of a fungal infection 10/22/16 On evaluation today patient's wounds appeared to be doing better compared to last week in regard to his bilateral lower extremities. Fortunately I happy with how things are progressing although he still has ulcers I feel like that he is improving and appropriate manner. AXTON, CIHLAR ([id number]) 11/12/16 on evaluation today patient appears to be doing well in regard to his bilateral feet and the respective wounds. We have been using surrounding her dressings along with an antifungal cream which seems to be doing very well. He has no bilateral dysfunction noticed that the rituals are weight loss at this point. He also has no nausea or vomiting a note purulent discharge. He did see Vein and vascular today and he tells me that they told him he could have surgery for his venous stasis but they did not feel like it was worth it in his words. Fortunately patient's wounds do appear to be getting sneakily better. 11/26/2016 -- he says he is going to have some surgery during this week at [facility] for possibly a colon resection. 12/31/2016 -- the patient has been noncompliant with his smoking and  I'm not sure whether he is also started drinking  again. He continues to be very nonchalant about his care 01/14/2017 -- the patient's HandP has been reviewed well and I understand he is being compliant with trying to give up smoking and his local dressing changes. He does not have any surgical options of 4 to him by his vascular surgeons.he was last seen in early August by Dr. Hortencia Pilar who recommended compression stockings,and possibly lymph pumps in 2-3 months after doing a review ultrasound. 01/28/2017 - the patient did not have any fresh complaints but on examination I noted a large lacerated wound on the plantar aspect of his right fourth toe which had a lot of necrotic debris and it probes down to bone. 02/07/2017 -- x-ray of the right foot -- IMPRESSION: Soft tissue swelling about the first through fourth toes consistent with cellulitis. New destructive change in the tuft of the distal phalanx of the great toe is consistent with osteomyelitis. 02/14/2017 -- the patient's MRI is pending this coming Monday and he still continues to smoke. We have again gone over off loading of his wounds in great detail and he says he's been compliant. 02/21/2017 -- MR of the right foot -- IMPRESSION: 1. Soft tissue ulcer at the tip of the first, second and third toe knows. Cortical irregularity and bone marrow edema in the first distal phalanx most concerning for osteomyelitis. Mild marrow edema in the second and third distal phalanx without definite cortical destruction which may reflect early osteomyelitis versus reactive marrow edema. 2. Soft tissue edema surrounding the first phalanx most consistent with cellulitis. the patient was also recently evaluated by his medical oncologist Dr. 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 She is treating him with weekly Procrit. She is also keeping intra-abdominal lymphadenopathy and right lower lobe lung nodule under  observation. 04/04/2017 -- he was seen by Dr. Adrian Prows on 03/25/2017 -- after review he empirically put him on ciprofloxacin and doxycycline as they have good bone penetration and good bioavailability and it will cover the usual pathogens and diabetic foot osteomyelitis. He will check inflammatory markers and plan a 32-13 week old records. C-reactive protein was 0.3 and the ESR was 72 04/18/17 on evaluation today patient appears to be doing about the same in regard to his lower extremity wounds bilaterally. He has continued to use the antifungal cream which does seem to be beneficial. Nonetheless the ulcers do seem to in some areas be epithelial eyes over and in other areas are still open. He is having no significant discomfort. 04/25/17-he is here in follow-up evaluation for multiple ulcerations to multiple toes bilaterally. He states he did see Dr. Ola Spurr again last week and continues antibiotic therapy. He is voicing no complaints or concerns, will continue with current treatment plan will possibility of adding compression therapy next week after an additional week of treatment/lotions to BLE prescribed by Dr Ola Spurr 05/02/17 he is here in follow up for for multiple ulcers to multiple toes bilaterally. we will stop using antifungal cream and will continue with silvercel and follow up next week 05/09/17-he is here in follow-up for multiple ulcerations to multiple toes bilaterally. There is improvement in appearance. He has not completely stopped using antifungal cream, but admits he has not using it between the toes. He has an appointment with Dr. Ola Spurr on 2/11, continues on doxycycline and Cipro. It has been 5 weeks of antibiotic therapy, we  will order plain film xray to evaluate for osteomyelitis next week, prior to follow up with ID. Will continue with silvercel and follow up next week 05/16/17-he is here in follow-up evaluation for multiple ulcerations to multiple toes bilaterally  and new wound to the right posterior heel. There is essentially no change in appearance, deteriorating measurements; he has a history of waxing and waning measurements. He admits that he continues to apply moisturizer/cream/ointment to his toes despite weekly reminders to only apply silvercel to his toes. He states that he thinks the surgical shoe contributed to the superficial ulcer to his posterior heel, he is unable to articulate if this was an area of dry cracked skin as he has a similar area to the left heel. He now is wearing open toed slippers. He has an appointment with Dr. Ola Spurr on 2/11. We have ordered x-rays for her bilateral feet; [name], [name] ([id number]) he was advised to obtain the x-rays today or tomorrow. He will follow-up next week 05/23/17-he is here in follow-up evaluation for multiple ulcerations to multiple toes bilaterally and the right posterior heel. There is improvement in maceration. He has been compliant and not applying any moisturizing agent to his toes. He has been using Lac-Hydrin for his lower extremities with improvement. He did not go to his appointment on Monday with Dr. Ola Spurr secondary to financial concerns. X-rays for her bilateral feet showed: LEFT FOOT with slight erosion of the tuft of the distal phalanges of the left first and second toe suspicious for osteomyelitis, RIGHT FOOT with 1.erosion of the tufts of the distal phalanges of the right first second and possibly third toes consistent with osteomyelitis, 2 no definitive abnormality of the calcaneus is seen on the images obtained, 3. Plantar calcaneal degenerative spur. We briefly discussed hyperbaric adjunctive therapy for treatment of chronic refractory osteomyelitis. I do not find an a1c in EMR, will contact PCP for record, or order if needed. He has been encouraged to contact Dr Ola Spurr office regarding the follow-up appointment, encouraged him to inquire about payment plan. We will  continue with same treatment plan and follow-up next week. He states he is still taking antibiotics and has "a lot" left. He states he has been taking them as directed, 2 pills twice daily. According to Dr. Blane Ohara office notes he was originally started on 12/17 for 4 weeks and extended on 1/14 for an additional 4 weeks. He should be done with his antibiotic therapy, he was advised to bring his bottles and to his next appointment, we will contact pharmacy. 05/30/17-he is here in follow-up evaluation for multiple ulcerations to multiple toes bilaterally and the right posterior heel. He is accompanied by his brother-in-law. Wounds are stable. He has yet to make up with Dr. Ola Spurr. We contacted his PCP, with no record of recent A1c we will draw an A1c. His brother-in-law states that he was taken off all of his diabetic medication secondary to kidney function. 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 The antibiotics were counted and have approximately 2 weeks left, although they should be complete. The pill organizer reveals missing days. We discussed the need for consistent medications, to have optimal benefit of medication. He has a cousin that lives with him and he will ask her to check his organizer daily. He has been advised to follow up with Dr Ola Spurr, and will go by the office today. He has been advised to  quit smoking. 06/06/17-he is here in follow up evaluation. He has had to make an appointment with Dr. Ola Spurr. He did have blood work obtained, a1c 5. He continues to take antibiotic therapy. Significant improvement in bilateral lower extremity edema with compression therapy. Essentially no change in ulcerations to toes. He states he is "going to try something different" and "let me know next week" if it works; he would not provide any additional information and was encouraged to  follow our orders. We will follow up next week 06/13/17-he is here in follow-up evaluation. He has an appointment with Dr. Ola Spurr tomorrow morning. He states he purchased an ointment from [facility] and applied to his toes for 3 days, he does not remember the name of the ointment. There is improvement to his wounds, minimal maceration. He continues to take antibiotic therapy, this should have been completed last month. His brother-in-law who regularly accompanies his appointments was asked to take the bottles to the appointment tomorrow with Dr. Ola Spurr so he is aware. We will continue with 3 layer compression, and order OPEN TOE compression 20-30mmHg; we will apply compression stockings next week. He continues to smoke, smoked "2 cigarettes" last week 06/20/17 on evaluation today patient did receive his compression stockings which he has with him today for both lower extremities. With that being said he tells me at this point in time that he is very happy to have these he really is not a big fan of the compression wraps that we have been utilizing although they have been of great benefit for him. Nonetheless at this point he does want to switch to the compression stockings. In my opinion as long as he is continuing with compression I'm okay with the stockings or the wraps. 06/27/17-he is here in follow-up evaluation for multiple ulcerations to his bilateral toes. There is some improvement in appearance. He is compliant in wearing his compression stockings with significant improvement in lower extremity edema. He saw Dr Ola Spurr on 3/8, per his notes they would redraw ESR and CRP; plan to continue antibiotic therapy if these remain elevated. I do not see an ESR or CRP level in Epic. The patient continues to take antibiotics. 07/11/17-He is here in follow-up evaluation for multiple ulcerations to multiple toes bilaterally. He presents with complete epithelialization to the right third toe; there  has been no deterioration. He continues on antibiotic therapy. He will follow-up next week 07/18/17-He is here in follow-up evaluation for multiple ulcerations to multiple toes bilaterally. He continues to make improvement. He continues on antibiotic therapy. He states he has been using something additional to our orders, he does not elaborate but states he will bring it in next week. 07/25/17-He is here in follow up evaluation for multiple ulcerations to bilateral toes. He is stable. He has completed antibiotic therapy. He admits to "filing" his toes after showers each evening, this is what he was referencing last week; he does not filing for the wounds. We will switch to [facility] and monitor for any improvement, he will follow-up next week 08/01/17-He is here in follow-up evaluation. He admits to "picking" at his toes after cleansing yesterday, leading to new areas of tissue loss on the bilateral second toe. There is improvement noted to the bilateral great toe. We will dress toes today and hope that they maintain until Monday where he will come in for a nurse visit. He has been advised, multiple times with expressed verbalization, to change the dressings to silvercel if the dressings get  wet prior to [facility] appointment. 08/08/17-He is here in follow-up evaluation for bilateral first and second toe ulcerations. There is significant improvement to all ulcerations since last visit. We will switch to [facility] to all wounds and he will continue with nurse visits on a Arth, Abyan E. ([id number]) Monday/Thursday schedule and follow-up with me in 2 weeks. He continues to smoke, 1-3 cigarettes per day, and has been encouraged to not smoke until his next follow-up in 2 weeks. 08/15/17 on evaluation today patient's ulcers on his toes actually appear to be doing fairly well the right to ulcers may be a little bit more moist compared to the left I'm not really sure exactly why as the openings appear  to be very small and I'm not seeing any evidence of anything significant as far as you lightest or otherwise. Nonetheless this may just be a small setback he's been doing very well with the [facility] Dressing 08/22/17-He is here in follow-up evaluation for ulcerations to his bilateral first and second toes, there is small/scant amount of drainage noted on today's dressing. He continues with Hydrofera Blue. He continues to smoke, 1 cigarette a day. Voices no complaint or concerns, compliant and compression therapy today. He'll follow-up next week 08/29/17-He is here in follow-up evaluation for ulcerations to his bilateral first and second toes, with a new wound to the left third toe. He states he cut himself while cutting the toenail. He presents today with more maceration and increased measurements; waxing and waning measurements and moisture has been an ongoing issue. He continues to smoke a proximally 1 cigarette per day, but states he has not smoked since Sunday. We will initiate medihoney daily and evaluate next week. He presents today without compression stockings 09/05/17-He is here in follow-up evaluation for ulcerations to bilateral first and second and left third toe. He did not pick up the medihoney and therefore has not been changing his dressing. He admits to inconsistent where of compression stockings, admits to pain to his bilateral lower extremities with unilateral edema (right greater than left). The ulcerations to multiple toes are more macerated and larger in size than last week. He is wearing compression therapy today. He admits to an overall feeling of weakness and fatigue and has been encouraged to contact his PCP for evaluation; he has a known history of anemia and intraabdominal lymphadenopathy. We will return to silvercel and he will follow up next week 09/19/17-He is here in follow-up evaluation for ulcerations to bilateral first and second toe.he was unable to make last  week's appointment. The culture that was obtained on 5/30 grew clindamycin sensitive MRSA; he was initiated on 6/4 but did not start it until 6/7. There is significant improvement in all wounds, healing to left second and third toe. He also admits to having no alcohol or smoking for the last 6 days. We will continue with same treatment plan I will extend the clindamycin for an additional 10 days and he will follow-up next week. 09/26/17- He is here in follow evaluation for multiple ulcerations to multiple toes bilaterally. He continues to be non-compliant with dressing, compression therapy. He states he is taking the antibiotics as prescribed, although his recall is inconsistent. There is noted deterioration in all ulcers. We will continue same treatment plan, antibiotic therapy and he will follow up next week. 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 This information is obtained from the medical record, as he cannot articulate specifics to his diagnosis or treatment plan. 10/17/17-He is here in follow-up evaluation for multiple ulcerations to multiple toes bilaterally. There is chronic waxing and waning improvement/deterioration. On today's exam they appear improved, dry without maceration. He continues to apply a variety of topical agents, stating that he has switched between the blue product and silver product. In my opinion, this will be a chronic waxing and waning giving his intermittent compliance. He will follow-up in 2 weeks. As it relates to the biopsy taken from his buttock, he has not followed up with that and has not started radiation therapy. 10/31/17-He is here for evaluation for multiple ulcerations to multiple toes bilaterally. Continues with waxing and waning improvement versus deterioration. They are stable on  today's exam without significant maceration. 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 We will continue with every 2 week follow-up appointments 11/14/17- He is seen in follow-up evaluation for multiple ulcerations to multiple bilateral toes. He is scheduled for excision of basal cell carcinoma to his right buttock on Monday 8/12. He continues to have chronic waxing and waning of his ulcers; today is a stable day with minimal drainage and left ear is better than right toes. I will recount to infectious disease regarding chronic suppressive therapy. He admits to being more compliant with compression stockings, although he is not wearing any today. He has been encouraged to continue with dressing changes as ordered and compression therapy daily. He will be seen in 2 weeks 11/28/17-He is seen in follow up evaluation for multiple ulcerations to multiple toes bilaterally. He had an overnight admission at [facility] (8/11-8/12) for symptomatic anemia, received two units prbc. His surgical date has been moved to 9/94 excision of basal cell carcinoma to the right buttock. He has been noncompliant in wearing compression stockings, applying topical treatment as ordered; he has been applying a "cream", Hydrofera Blue, silvercel. He is currently on no antibiotic therapy; we will send for re-referral to ID for suppressive therapy given his multiple co-morbidities, poor surgical candidate, unwilling to have toe amputation and general non-adherence. 12/19/17 on evaluation today patient actually appears to be doing very well in regard to his toes at least compared to last time I saw him. He again has been coming to our office for quite a bit of time. 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 He states is hurting a little bit but doing well in general. I'm see were not taking  care of this area dermatology is. He does have his appointment with infectious disease upcoming which they re-consult for suppressive therapy that's next Monday. 01/02/18 on evaluation today patient actually appears to be doing rather well in regard to his toe ulcers although he tells me he [name], [name] ([id number]) has been put in an ointment on this which I am concerned may be causing some additional moisture buildup which we really do not want. He supposed be using silver cell which is mainly thing I want him to be utilizing. Fortunately there does not appear to be any evidence of worsening infection which is good news. He has had surgery on his gluteal region where he had an excision of a cancerous area fortunately that seems to be doing well although he is having some  discomfort. 02/06/18 on evaluation today patient actually appears to be doing well at all locations except for his second toe use bilaterally where the distal tip is still wrong open. He did state that he can't stand for dry skin to buildup on his toes subsequently as a question and further it sounds as if he actually pills this all which is likely keeping the toe is wrong and open. Also think that's what happened in the bottom of his foot although that appears to have healed at this point. There does not appear to be any evidence of infection. 02/20/18 on evaluation today patient appears to be doing better in regard to his foot ulcers. Everything has shown signs of improvement which is excellent news. Overall I'm very pleased with how things appear currently. The patient states he's not picking her point at any skin and that he wears shoes at all times when he is ambulating at home. Both are things that I've advocated and recommended for him in the past. 03/13/18 on evaluation today patient actually appears to be doing very well in regard to the bilateral toes in the overall appearance of the wound bed locations. Fortunately  there does not appear to be any signs of infection at this time. He states that he is very pleased with how things seem to be progressing to be honest as am I. 03/27/1899 evaluation today patient appears to be doing well in regard to his toe ulcers. He just has a few areas remaining and these appear to be showing signs of improvement as well which is great news. Overall I have been impressed with the progress of the past several weeks. 04/10/18 on evaluation today patient appears to be doing a little bit worse in regard to the right plantar foot at the regional the first metatarsal as well as the left second toe. He tells me that he ran out of bandages he also tells me he's been wearing his flip-flops a lot at home as well as a new pair of boots that he received as well. This is as opposed to the postop shoes that we had previously given him. Nonetheless overall I feel like that he has had a little bit more friction to the area which is causing things to look a little bit worse today. 04/24/18 on evaluation today patient's wounds for the most part appear to be doing excellent especially in regard to the bilateral second toes. With that being said he does have issues with the wound still on the first metatarsal region and first toe of the right foot which have not completely closed at this point. Nonetheless I do feel like that he is making progress which is good news. 05/08/18 on evaluation today patient's wounds appear to be doing just a little bit worse than normal simply due to the fact that he has not had dressing supplies. He apparently owed $35 in order for them to be shipped and unfortunately is not able to pay that currently. We did give him information for the Deputy today which will hopefully help cover his dressing supplies. He was thankful for this he states is gonna contact them this afternoon. Fortunately otherwise or does not hear any signs of infection. 05/22/18 on  evaluation today patient appears to be doing somewhat poorly in regard to his toes. Upon further questioning it appears that he actually has been pulling skin off of the toes which "drive him crazy". Subsequently as he does this he calls his  new areas to tear open and I think this is one of the reasons why his toes are not healing as appropriately as we would like. He fortunately has no signs of infection at this time. With that being said I'm still concerned about the possibility of a fungal infection even though there doesn't appear to be anything bacterial. Potentially nystatin powder could be of benefit for him as far as trying to help this in that regard. 06/05/18 on evaluation today patient actually appears to be doing very well in regard to his foot ulcers. Fortunately there's no signs of infection. He's been tolerating the dressing changes without complication. I'm very pleased with how things seem to be progressing. Fortunately there's no evidence of active infection at this point I do believe nystatin powder has been of benefit for him. 06/19/18 on evaluation today patient actually appears to be doing a little worse compared to last evaluation. Tells me that he has not had any supplies for two weeks. With that being said his plantar foot wounds on the right actually appear to be doing very well. It's actually a second toes in the first toes that are the worst. I think these are areas the was able to get to and pick and he tells me in fact that he destine to pick out some of the dry skin because it causes him annoyance. Nonetheless I think this is somewhat detrimental to him I think it's worth keeping part of the wound open in particular. Fortunately there is no sign of infection at this time although I think that is a risk which I discussed specifically that he's not careful you can end up reopening this and causing greater problems for himself. [name], [name] ([id number]) 07/03/18 on  evaluation today patient's wound bed actually shows evidence of good granulation at this time and epithelialization as far as the wounds in general are concerned. He has been tolerating the dressing changes including the nystatin without complication. This is good news. With that being said he seems to be still having some trouble with the left second toe distally at this point. 4/9; most of the wounds look quite satisfactory. He requires debridement of the plantar right great toe otherwise everything looks superficial to improved. His left second toe is healed. 08/14/18 on evaluation today patient actually appears to be doing very well in regard to his foot ulcerations. In fact he just has three small areas of the right foot still remaining open the left foot is closed and dry at this point. Overall I feel like he is doing quite well which is excellent news. Fortunately there's no signs of active infection. 08/28/18 on evaluation today patient's toes of the right foot actually shows signs of being a little bit worse in regard to the overall open areas at this point. He does tell me that he "washes them well" when he is taking a shower. Again I explained that I want to make sure he's not washing them to aggressively as this could potentially cause issues with overall worsening. 09/12/18 on evaluation today patient actually appears to be doing well in regard to his right foot ulcers. He's been tolerating the dressing changes without complication. Fortunately there's no signs of active infection at this time. With that being said he does have a new one on the dorsal surface of his foot just proximal to his toes he does not know really where this came from. 09/25/18 on evaluation today patient actually appears to be doing  somewhat better in regard to his right foot ulcers although he has a new opening on the left second toe unfortunately. This is something that seems to be back and forth he tells me  did [place] scan at this area that is why it's now open yet again. No fevers, chills, nausea, or vomiting noted at this time. 10/16/18 on evaluation today patient actually appears to be doing decently poorly in regard to his ulcers on his feet. He tells me that for the past 3-4 days he is not having to take therefore is not been putting on any dressings he came in with the socks actually just stuck to the toes with a been draining. He also has not picked up the nystatin powder that was sent to the pharmacy with refills all he has to do is go pick that up he has not done so. He also told me for the more that he feels like his "blood is low" which seems to be referring to his hemoglobin [facility] and subsequently states he needs to go there when he leaves here in order to see about a blood transfusion. Nonetheless I think that's something that he definitely needs to get on top of if he feels like that's necessary. Fortunately there's no signs of active infection at this time. 10/30/18 on evaluation today patient appears to be doing a little worse in regard to his lower extremity ulcers specifically his toes for the most part although he does have an open wound on the right dorsal foot. He has been picking at his toes which damages the skin and leads to what we are seeing today. Fortunately there is no signs of active infection systemically or locally. With that being said I am concerned about the fact that I'm not sure he's ever really gonna feel as long as he keeps messing with his toes such as picking and bothering them which causes them to reopen on a regular basis. 11/13/18 upon inspection today patient appears to be doing somewhat more poorly currently compared to the last time I saw him. He still has not been wearing his compression stockings even if he did they do not cover the toes which I think may be more of a complication problem for him to be honest. He  many compression stockings that do indeed cover his toes but he also needs to have the willpower to actually wear these in a regular basis so that they actually do him any good at all. He also tells me that he is out of dressing supplies and that he needs to go pick some up so that he will have them. Same is true of the powder although he tells me it's at the pharmacy he just has to go pick it up. Patient History Information obtained from Patient. Social History Current every day smoker, Marital Status - Widowed, Alcohol Use - Daily - quit drinking about a week ago, Drug Use - No History, Caffeine Use - Moderate. Medical History Eyes Denies history of Cataracts, Glaucoma, Optic Neuritis Ear/Nose/Mouth/Throat Denies history of Chronic sinus problems/congestion, Middle ear problems Hematologic/Lymphatic Tiller, MAXEMILIANO RIEL. ([id number]) Patient has history of Anemia, Lymphedema Denies history of Hemophilia, Human Immunodeficiency Virus, Sickle Cell Disease Respiratory Denies history of Aspiration, Asthma, Chronic Obstructive Pulmonary Disease (COPD), Pneumothorax, Sleep Apnea, Tuberculosis Cardiovascular Patient has history of Congestive Heart Failure, Hypertension, Peripheral Venous Disease Denies history of Angina, Arrhythmia, Coronary Artery Disease, Deep Vein Thrombosis, Hypotension,  Myocardial Infarction, Peripheral Arterial Disease, Phlebitis, Vasculitis Gastrointestinal Denies history of Cirrhosis , Colitis, Crohn s, Hepatitis A, Hepatitis B, Hepatitis C Endocrine Patient has history of Type II Diabetes Genitourinary Denies history of End Stage Renal Disease Immunological Denies history of Lupus Erythematosus, Raynaud s, Scleroderma Integumentary (Skin) Denies history of History of Burn, History of pressure wounds Musculoskeletal Denies history of Gout, Rheumatoid Arthritis, Osteoarthritis, Osteomyelitis Neurologic Patient has history of Neuropathy Denies history of Dementia,  Quadriplegia, Paraplegia, Seizure Disorder Medical And Surgical History Notes 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 Respiratory Denies complaints or symptoms of Chronic or frequent coughs, Shortness of Breath. Cardiovascular Complains or has symptoms of LE edema. Denies complaints or symptoms of Chest pain. Psychiatric Denies complaints or symptoms of Anxiety, Claustrophobia. Objective Constitutional Well-nourished and well-hydrated in no acute distress. Vitals Time Taken: 9:20 AM, Height: 69 in, Weight: 168 lbs, BMI: 24.8, Temperature: 99.0 F, Pulse: 72 bpm, Respiratory Rate: 16 breaths/min, Blood Pressure: 132/71 mmHg. Respiratory normal breathing without difficulty. clear to auscultation bilaterally. Ray, Kendarius E. ([id number]) Cardiovascular regular rate and rhythm with normal S1, S2. Psychiatric this patient is able to make decisions and demonstrates good insight into disease process. Alert and Oriented x 3. pleasant and cooperative. General Notes: Upon inspection patient has definite issues with his toes as far as swelling is concerned as well as his lower extremities. He has been using the powder he tells me although he is out right now we've also been utilizing surrounded dressing that he states the one that we have here in the clinic is better than what he has at home. Integumentary (Hair, Skin) Wound #16 status is Open. Original cause of wound was Not Known. The wound is located on the [employer]. The wound measures 0.9cm length x 1cm width x 0.1cm depth; 0.707cm^2 area and 0.071cm^3 volume. There is Fat Layer (Subcutaneous Tissue) Exposed exposed. There is no tunneling or undermining noted. There is a large amount of serous drainage noted. The wound margin is flat and intact. There  is large (67-100%) pink granulation within the wound bed. There is no necrotic tissue within the wound bed. Wound #18 status is Open. Original cause of wound was Gradually Appeared. The wound is located on the Right,Dorsal [employer] The wound measures 0.2cm length x 0.2cm width x 0.1cm depth; 0.031cm^2 area and 0.003cm^3 volume. There is Fat Layer (Subcutaneous Tissue) Exposed exposed. There is no tunneling or undermining noted. There is a large amount of serous drainage noted. The wound margin is flat and intact. There is large (67-100%) pale granulation within the wound bed. There is no necrotic tissue within the wound bed. Wound #2 status is Open. Original cause of wound was Gradually Appeared. The wound is located on the Right Toe Second. The wound measures 0.8cm length x 1.6cm width x 0.1cm depth; 1.005cm^2 area and 0.101cm^3 volume. There is Fat Layer (Subcutaneous Tissue) Exposed exposed. There is no tunneling or undermining noted. There is a large amount of serous drainage noted. The wound margin is flat and intact. There is large (67-100%) pink granulation within the wound bed. There is no necrotic tissue within the wound bed. Wound #20 status is Open. Original cause of wound was Pressure Injury. The wound is located on the Left Toe Second. The wound measures 1.5cm length x 1.5cm width x 0.1cm depth; 1.767cm^2 area and 0.177cm^3  volume. There is Fat Layer (Subcutaneous Tissue) Exposed exposed. There is no tunneling or undermining noted. There is a medium amount of serous drainage noted. The wound margin is flat and intact. There is large (67-100%) pink granulation within the wound bed. There is no necrotic tissue within the wound bed. Wound #21 status is Open. Original cause of wound was Shear/Friction. The wound is located on the Left Toe Great. The wound measures 1.8cm length x 0.4cm width x 0.1cm depth; 0.565cm^2 area and 0.057cm^3 volume. The wound is limited to skin breakdown. There  is no tunneling or undermining noted. There is a small amount of serous drainage noted. The wound margin is flat and intact. There is large (67-100%) pink granulation within the wound bed. There is no necrotic tissue within the wound bed. Wound #22 status is Open. Original cause of wound was Gradually Appeared. The wound is located on the Right,Distal,Dorsal Foot. The wound measures 0.8cm length x 0.3cm width x 0.1cm depth; 0.188cm^2 area and 0.019cm^3 volume. The wound is limited to skin breakdown. There is no tunneling or undermining noted. There is a large amount of serous drainage noted. The wound margin is flat and intact. There is large (67-100%) pink granulation within the wound bed. There is no necrotic tissue within the wound bed. Assessment Active Problems ICD-10 Non-pressure chronic ulcer of other part of left foot limited to breakdown of skin [place], [name] ([id number]) Non-pressure chronic ulcer of other part of right foot limited to breakdown of skin Type 2 diabetes mellitus with foot ulcer Chronic venous hypertension (idiopathic) with inflammation of bilateral lower extremity Lymphedema, not elsewhere classified Nicotine dependence, cigarettes, with other nicotine-induced disorders Alcohol abuse with unspecified alcohol-induced disorder Chronic multifocal osteomyelitis, right ankle and foot Procedures Wound #21 Pre-procedure diagnosis of Wound #21 is a Diabetic Wound/Ulcer of the Lower Extremity located on the Left Toe Great .Severity of Tissue Pre Debridement is: Fat layer exposed. There was a Excisional Skin/Subcutaneous Tissue Debridement with a total area of 0.72 sq cm performed by STONE III, HOYT E., PA-C. With the following instrument(s): Curette to remove Viable and Non-Viable tissue/material. Material removed includes Callus, Subcutaneous Tissue, and Slough after achieving pain control using Lidocaine. A time out was conducted at 09:52, prior to the start of the  procedure. A Minimum amount of bleeding was controlled with Pressure. The procedure was tolerated well. Post Debridement Measurements: 1.8cm length x 0.4cm width x 0.1cm depth; 0.057cm^3 volume. Character of Wound/Ulcer Post Debridement is stable. Severity of Tissue Post Debridement is: Fat layer exposed. Post procedure Diagnosis Wound #21: Same as Pre-Procedure Plan Wound Cleansing: Wound #16 Right,Plantar Toe Great: Clean wound with Normal Saline. Wound #18 Right,Dorsal Toe Great: Clean wound with Normal Saline. Wound #2 Right Toe Second: Clean wound with Normal Saline. Wound #20 Left Toe Second: Clean wound with Normal Saline. Wound #21 Left Toe Great: Clean wound with Normal Saline. Wound #22 Right,Distal,Dorsal Foot: Clean wound with Normal Saline. Anesthetic (add to Medication List): Wound #16 Right,Plantar Toe Great: Topical Lidocaine 4% cream applied to wound bed prior to debridement (In Clinic Only). Wound #18 Right,Dorsal Toe Great: Topical Lidocaine 4% cream applied to wound bed prior to debridement (In Clinic Only). Wound #2 Right Toe Second: Topical Lidocaine 4% cream applied to wound bed prior to debridement (In Clinic Only). Wound #20 Left Toe Second: Topical Lidocaine 4% cream applied to wound bed prior to debridement (In Clinic Only). Wound #21 Left Toe Great: Topical Lidocaine 4% cream applied to wound bed prior  to debridement (In Clinic Only). Wound #22 Right,Distal,Dorsal Foot: Kroboth, Dayvin E. ([id number]) Topical Lidocaine 4% cream applied to wound bed prior to debridement (In Clinic Only). Skin Barriers/Peri-Wound Care: Wound #16 Right,Plantar Toe Great: Antifungal powder-Nystatin Wound #18 Right,Dorsal Toe Great: Antifungal powder-Nystatin Wound #2 Right Toe Second: Antifungal powder-Nystatin Wound #20 Left Toe Second: Antifungal powder-Nystatin Wound #21 Left Toe Great: Antifungal powder-Nystatin Wound #22 Right,Distal,Dorsal Foot: Antifungal  powder-Nystatin Primary Wound Dressing: Wound #16 Right,Plantar Toe Great: Silver Alginate - secure with tape Wound #18 Right,Dorsal Toe Great: Silver Alginate - secure with tape Wound #2 Right Toe Second: Silver Alginate - secure with tape Wound #20 Left Toe Second: Silver Alginate - secure with tape Wound #21 Left Toe Great: Silver Alginate - secure with tape Wound #22 Right,Distal,Dorsal Foot: Silver Alginate - secure with tape Dressing Change Frequency: Wound #16 Right,Plantar Toe Great: Change dressing every other day. Wound #18 Right,Dorsal Toe Great: Change dressing every other day. Wound #2 Right Toe Second: Change dressing every other day. Wound #20 Left Toe Second: Change dressing every other day. Wound #21 Left Toe Great: Change dressing every other day. Wound #22 Right,Distal,Dorsal Foot: Change dressing every other day. Follow-up Appointments: Wound #16 Right,Plantar Toe Great: Return Appointment in 1 week. Wound #18 Right,Dorsal Toe Great: Return Appointment in 1 week. Wound #2 Right Toe Second: Return Appointment in 1 week. Wound #20 Left Toe Second: Return Appointment in 1 week. Wound #21 Left Toe Great: Return Appointment in 1 week. Wound #22 Right,Distal,Dorsal Foot: Return Appointment in 1 week. Edema Control: Wound #16 Right,Plantar Toe Great: Patient to wear own compression stockings Wound #18 Right,Dorsal Toe Great: Patient to wear own compression stockings Wound #2 Right Toe Second: Patient to wear own compression stockings Mahrt, Clayborn E. ([id number]) Wound #20 Left Toe Second: Patient to wear own compression stockings Wound #21 Left Toe Great: Patient to wear own compression stockings Wound #22 Right,Distal,Dorsal Foot: Patient to wear own compression stockings Additional Orders / Instructions: Wound #16 Right,Plantar Toe Great: Stop Smoking Increase protein intake. Wound #18 Right,Dorsal Toe Great: Stop Smoking Increase protein  intake. Wound #2 Right Toe Second: Stop Smoking Increase protein intake. Wound #20 Left Toe Second: Stop Smoking Increase protein intake. Wound #21 Left Toe Great: Stop Smoking Increase protein intake. Wound #22 Right,Distal,Dorsal Foot: Stop Smoking Increase protein intake. Medications-please add to medication list.: Wound #16 Right,Plantar Toe Great: Other: - Nystatin Powder Wound #18 Right,Dorsal Toe Great: Other: - Nystatin Powder 1. At this point I'm gonna recommend that we continue with the topical nystatin powder on his toes as this seems to help keep things dry. 2. I am also going to recommend that we see about ordering him 30-40 mmHg compression stockings which include covering the toes in order to help with keeping them from swelling as much. 3. I recommended at this point as well that he does need to stop smoking which we previously discussed several times before as well. 4. With regard to the dressings he needs to keep these on all times which was also discussed with him at this point. 5. Lastly also it was discussed with the patient that he needs to not pull skin off of his toes as this leads to further breakdown and issues which we are seeing right now. Please see above for specific wound care orders. We will see patient for re-evaluation in 1 week(s) here in the clinic. If anything worsens or changes patient will contact our office for additional recommendations. Electronic Signature(s) Signed: 11/14/2018 2:47:02  AM By: Worthy Keeler PA-C Entered By: Worthy Keeler on 11/14/2018 02:35:14 Plass, CEASER EBELING ([id number]) Grove, [name] ([id number]) -------------------------------------------------------------------------------- ROS/PFSH Details Patient Name: Mendia, Khamarion E. Date of Service: 11/13/2018 9:15 AM Medical Record Number: [id number] Patient Account Number: [id number] Date of Birth/Sex: [birth year] (68 y.o. M) Treating RN: Army Melia Primary Care Provider:  Lamonte Sakai Other Clinician: Referring Provider: Lamonte Sakai Treating Provider/Extender: Melburn Hake, HOYT Weeks in Treatment: 111 Information Obtained From Patient Constitutional Symptoms (General Health) Complaints and Symptoms: Negative for: Fatigue; Fever; Chills; Marked Weight Change Respiratory Complaints and Symptoms: Negative for: Chronic or frequent coughs; Shortness of Breath Medical History: Negative for: Aspiration; Asthma; Chronic Obstructive Pulmonary Disease (COPD); Pneumothorax; Sleep Apnea; Tuberculosis Cardiovascular Complaints and Symptoms: Positive for: LE edema Negative for: Chest pain Medical History: Positive for: Congestive Heart Failure; Hypertension; Peripheral Venous Disease Negative for: Angina; Arrhythmia; Coronary Artery Disease; Deep Vein Thrombosis; Hypotension; Myocardial Infarction; Peripheral Arterial Disease; Phlebitis; Vasculitis Psychiatric Complaints and Symptoms: Negative for: Anxiety; Claustrophobia Eyes Medical History: Negative for: Cataracts; Glaucoma; Optic Neuritis Ear/Nose/Mouth/Throat Medical History: Negative for: Chronic sinus problems/congestion; Middle ear problems Hematologic/Lymphatic Medical History: Positive for: Anemia; Lymphedema Negative for: Hemophilia; Human Immunodeficiency Virus; Sickle Cell Disease Gastrointestinal Hallmark, Malichi E. ([id number]) Medical History: Negative for: Cirrhosis ; Colitis; Crohnos; Hepatitis A; Hepatitis B; Hepatitis C Endocrine Medical History: Positive for: Type II Diabetes Treated with: Oral agents Blood sugar tested every day: Yes Tested : QD Genitourinary Medical History: Negative for: End Stage Renal Disease Immunological Medical History: Negative for: Lupus Erythematosus; Raynaudos; Scleroderma Integumentary (Skin) Medical History: Negative for: History of Burn; History of pressure wounds Musculoskeletal Medical History: Negative for: Gout; Rheumatoid Arthritis;  Osteoarthritis; Osteomyelitis Neurologic Medical History: Positive for: Neuropathy Negative for: Dementia; Quadriplegia; Paraplegia; Seizure Disorder Oncologic Medical History: Past Medical History Notes: 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 CASY, BRUNETTO ([id number]) Electronic Signature(s) Signed: 11/14/2018 2:47:02 AM By: Worthy Keeler PA-C Signed: 11/14/2018 11:48:59 AM By: Army Melia Entered By: Worthy Keeler on 11/14/2018 02:32:13 Kuk, [name] ([id number]) -------------------------------------------------------------------------------- SuperBill Details Patient Name: Parveen, Clarice E. Date of Service: 11/13/2018 Medical Record Number: [id number] Patient Account Number: [id number] Date of Birth/Sex: 1951-03-19 (68 y.o. M) Treating RN: Army Melia Primary Care Provider: Lamonte Sakai Other Clinician: Referring Provider: Lamonte Sakai Treating Provider/Extender: Melburn Hake, HOYT Weeks in Treatment: 111 Diagnosis Coding ICD-10 Codes Code Description L97.521 Non-pressure chronic ulcer of other part of left foot limited to breakdown of skin L97.511 Non-pressure chronic ulcer of other part of right foot limited to breakdown of skin E11.621 Type 2 diabetes mellitus with foot ulcer I87.323 Chronic venous hypertension (idiopathic) with inflammation of bilateral lower extremity I89.0 Lymphedema, not elsewhere classified F17.218 Nicotine dependence,  cigarettes, 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 abuse 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, right ankle and foot Facility Procedures CPT4 Code Description: 24235361 11042 - DEB SUBQ TISSUE 20 SQ CM/< ICD-10 Diagnosis Description L97.521 Non-pressure chronic ulcer of other part of left foot limited t Modifier: o breakdown of s Quantity: 1 kin Physician  Procedures CPT4 Code Description: 9323557 99214 - WC PHYS LEVEL 4 - EST PT ICD-10 Diagnosis Description L97.521 Non-pressure chronic ulcer of other part of left foot limited to L97.511 Non-pressure chronic ulcer of other part of right foot limited t E11.621 Type 2  diabetes mellitus with foot ulcer I87.323 Chronic venous hypertension (idiopathic) with inflammation of bi Modifier: 25 breakdown of ski o breakdown of sk lateral lower ext Quantity: 1 n in remity CPT4 Code Description: 3220254 11042 - WC PHYS SUBQ TISS 20 SQ CM ICD-10 Diagnosis Description L97.521 Non-pressure chronic ulcer of other part of left foot limited to Modifier: breakdown of ski Quantity: 1 n Electronic Signature(s) Signed: 11/14/2018 2:47:02 AM By: Worthy Keeler PA-C Entered By: Worthy Keeler on 11/14/2018 02:35:51

## 2018-11-17 ENCOUNTER — Ambulatory Visit: Payer: Medicare Other

## 2018-11-18 ENCOUNTER — Other Ambulatory Visit: Payer: Self-pay | Admitting: *Deleted

## 2018-11-18 ENCOUNTER — Inpatient Hospital Stay: Payer: Medicare Other | Attending: Oncology

## 2018-11-18 ENCOUNTER — Inpatient Hospital Stay: Payer: Medicare Other

## 2018-11-18 ENCOUNTER — Other Ambulatory Visit: Payer: Self-pay

## 2018-11-18 ENCOUNTER — Other Ambulatory Visit: Payer: Self-pay | Admitting: Oncology

## 2018-11-18 VITALS — BP 120/69 | HR 68

## 2018-11-18 DIAGNOSIS — D472 Monoclonal gammopathy: Secondary | ICD-10-CM | POA: Diagnosis not present

## 2018-11-18 DIAGNOSIS — E1122 Type 2 diabetes mellitus with diabetic chronic kidney disease: Secondary | ICD-10-CM | POA: Insufficient documentation

## 2018-11-18 DIAGNOSIS — E611 Iron deficiency: Secondary | ICD-10-CM | POA: Diagnosis not present

## 2018-11-18 DIAGNOSIS — D649 Anemia, unspecified: Secondary | ICD-10-CM

## 2018-11-18 DIAGNOSIS — Z79899 Other long term (current) drug therapy: Secondary | ICD-10-CM | POA: Insufficient documentation

## 2018-11-18 DIAGNOSIS — D631 Anemia in chronic kidney disease: Secondary | ICD-10-CM

## 2018-11-18 DIAGNOSIS — J449 Chronic obstructive pulmonary disease, unspecified: Secondary | ICD-10-CM | POA: Insufficient documentation

## 2018-11-18 DIAGNOSIS — F17210 Nicotine dependence, cigarettes, uncomplicated: Secondary | ICD-10-CM | POA: Diagnosis not present

## 2018-11-18 DIAGNOSIS — N189 Chronic kidney disease, unspecified: Secondary | ICD-10-CM | POA: Diagnosis not present

## 2018-11-18 DIAGNOSIS — K219 Gastro-esophageal reflux disease without esophagitis: Secondary | ICD-10-CM | POA: Insufficient documentation

## 2018-11-18 DIAGNOSIS — D509 Iron deficiency anemia, unspecified: Secondary | ICD-10-CM

## 2018-11-18 DIAGNOSIS — I130 Hypertensive heart and chronic kidney disease with heart failure and stage 1 through stage 4 chronic kidney disease, or unspecified chronic kidney disease: Secondary | ICD-10-CM | POA: Diagnosis not present

## 2018-11-18 DIAGNOSIS — I509 Heart failure, unspecified: Secondary | ICD-10-CM | POA: Insufficient documentation

## 2018-11-18 DIAGNOSIS — K7460 Unspecified cirrhosis of liver: Secondary | ICD-10-CM | POA: Insufficient documentation

## 2018-11-18 LAB — CBC
HCT: 21.5 % — ABNORMAL LOW (ref 39.0–52.0)
Hemoglobin: 6.8 g/dL — ABNORMAL LOW (ref 13.0–17.0)
MCH: 30.8 pg (ref 26.0–34.0)
MCHC: 31.6 g/dL (ref 30.0–36.0)
MCV: 97.3 fL (ref 80.0–100.0)
Platelets: 154 10*3/uL (ref 150–400)
RBC: 2.21 MIL/uL — ABNORMAL LOW (ref 4.22–5.81)
RDW: 15.4 % (ref 11.5–15.5)
WBC: 7 10*3/uL (ref 4.0–10.5)
nRBC: 0 % (ref 0.0–0.2)

## 2018-11-18 LAB — SAMPLE TO BLOOD BANK

## 2018-11-18 MED ORDER — EPOETIN ALFA-EPBX 40000 UNIT/ML IJ SOLN: 50000.0000 [IU] | Freq: Once | INTRAMUSCULAR | Status: DC

## 2018-11-18 MED ORDER — EPOETIN ALFA-EPBX 40000 UNIT/ML IJ SOLN
40000.0000 [IU] | Freq: Once | INTRAMUSCULAR | Status: AC
  Administered 2018-11-18: 11:00:00 40000 [IU] via SUBCUTANEOUS
  Filled 2018-11-18: qty 1

## 2018-11-18 MED ORDER — EPOETIN ALFA-EPBX 10000 UNIT/ML IJ SOLN
10000.0000 [IU] | Freq: Once | INTRAMUSCULAR | Status: AC
  Administered 2018-11-18: 10000 [IU] via SUBCUTANEOUS
  Filled 2018-11-18: qty 1

## 2018-11-19 ENCOUNTER — Other Ambulatory Visit: Payer: Self-pay | Admitting: *Deleted

## 2018-11-19 DIAGNOSIS — D649 Anemia, unspecified: Secondary | ICD-10-CM

## 2018-11-19 LAB — PREPARE RBC (CROSSMATCH)

## 2018-11-20 ENCOUNTER — Encounter: Payer: Medicare Other | Admitting: Physician Assistant

## 2018-11-20 ENCOUNTER — Other Ambulatory Visit: Payer: Self-pay

## 2018-11-20 DIAGNOSIS — E11621 Type 2 diabetes mellitus with foot ulcer: Secondary | ICD-10-CM | POA: Diagnosis not present

## 2018-11-20 NOTE — Progress Notes (Signed)
[name], [name] ([id number]) Visit Report for 11/20/2018 Arrival Information Details Patient Name: [name], [name] Date of Service: 11/20/2018 9:30 AM Medical Record Number: [id number] Patient Account Number: [id number] Date of Birth/Sex: November 19, 1950 (68 y.o. M) Treating RN: Army Melia Primary Care Ova Gillentine: Lamonte Sakai Other Clinician: Referring Ashyra Cantin: Lamonte Sakai Treating Fahad Cisse/Extender: Melburn Hake, HOYT Weeks in Treatment: 8 Visit Information History Since Last Visit Added or deleted any medications: No Patient Arrived: Ambulatory Any new allergies or adverse reactions: No Arrival Time: 09:39 Had a fall or experienced change in No Accompanied By: self activities of daily living that may affect Transfer Assistance: None risk of falls: Patient Identification Verified: Yes Signs or symptoms of abuse/neglect since last visito No Secondary Verification Process Completed: Yes Hospitalized since last visit: No Patient Requires Transmission-Based No Implantable device outside of the clinic excluding No Precautions: cellular tissue based products placed in the center Patient Has Alerts: Yes since last visit: Patient Alerts: DMII Has Dressing in Place as Prescribed: Yes Pain Present Now: No Electronic Signature(s) Signed: 11/20/2018 4:02:39 PM By: Lorine Bears RCP, RRT, CHT Entered By: Becky Sax, Amado Nash on 11/20/2018 09:40:27 Setters, [name] ([id number]) -------------------------------------------------------------------------------- Clinic Level of Care Assessment Details Patient Name: [name], [name] Date of Service: 11/20/2018 9:30 AM Medical Record Number: [id number] Patient Account Number: [id number] Date of Birth/Sex: November 13, 1950 (68 y.o. M) Treating RN: Army Melia Primary Care Charitie Hinote: Lamonte Sakai Other Clinician: Referring Lavan Imes: Lamonte Sakai Treating Elder Davidian/Extender: Melburn Hake, HOYT Weeks in Treatment: 112 Clinic Level of Care  Assessment Items TOOL 4 Quantity Score []  - Use when only an EandM is performed on FOLLOW-UP visit 0 ASSESSMENTS - Nursing Assessment / Reassessment X - Reassessment of Co-morbidities (includes updates in patient status) 1 10 X- 1 5 Reassessment of Adherence to Treatment Plan ASSESSMENTS - Wound and Skin Assessment / Reassessment []  - Simple Wound Assessment / Reassessment - one wound 0 X- 6 5 Complex Wound Assessment / Reassessment - multiple wounds []  - 0 Dermatologic / Skin Assessment (not related to wound area) ASSESSMENTS - Focused Assessment []  - Circumferential Edema Measurements - multi extremities 0 []  - 0 Nutritional Assessment / Counseling / Intervention []  - 0 Lower Extremity Assessment (monofilament, tuning fork, pulses) []  - 0 Peripheral Arterial Disease Assessment (using hand held doppler) ASSESSMENTS - Ostomy and/or Continence Assessment and Care []  - Incontinence Assessment and Management 0 []  - 0 Ostomy Care Assessment and Management (repouching, etc.) PROCESS - Coordination of Care X - Simple Patient / Family Education for ongoing care 1 15 []  - 0 Complex (extensive) Patient / Family Education for ongoing care []  - 0 Staff obtains Programmer, systems, Records, Test Results / Process Orders []  - 0 Staff telephones HHA, Nursing Homes / Clarify orders / etc []  - 0 Routine Transfer to another Facility (non-emergent condition) []  - 0 Routine Hospital Admission (non-emergent condition) []  - 0 New Admissions / Biomedical engineer / Ordering NPWT, Apligraf, etc. []  - 0 Emergency Hospital Admission (emergent condition) X- 1 10 Simple Discharge Coordination [name], [name] ([id number]) []  - 0 Complex (extensive) Discharge Coordination PROCESS - Special Needs []  - Pediatric / Minor Patient Management 0 []  - 0 Isolation Patient Management []  - 0 Hearing / Language / Visual special needs []  - 0 Assessment of Community assistance (transportation, D/C planning,  etc.) []  - 0 Additional assistance / Altered mentation []  - 0 Support Surface(s) Assessment (bed, cushion, seat, etc.) INTERVENTIONS - Wound Cleansing / Measurement []  - Simple Wound Cleansing - one wound  0 X- 6 5 Complex Wound Cleansing - multiple wounds X- 1 5 Wound Imaging (photographs - any number of wounds) []  - 0 Wound Tracing (instead of photographs) []  - 0 Simple Wound Measurement - one wound X- 6 5 Complex Wound Measurement - multiple wounds INTERVENTIONS - Wound Dressings []  - Small Wound Dressing one or multiple wounds 0 X- 6 15 Medium Wound Dressing one or multiple wounds []  - 0 Large Wound Dressing one or multiple wounds []  - 0 Application of Medications - topical []  - 0 Application of Medications - injection INTERVENTIONS - Miscellaneous []  - External ear exam 0 []  - 0 Specimen Collection (cultures, biopsies, blood, body fluids, etc.) []  - 0 Specimen(s) / Culture(s) sent or taken to Lab for analysis []  - 0 Patient Transfer (multiple staff / Civil Service fast streamer / Similar devices) []  - 0 Simple Staple / Suture removal (25 or less) []  - 0 Complex Staple / Suture removal (26 or more) []  - 0 Hypo / Hyperglycemic Management (close monitor of Blood Glucose) []  - 0 Ankle / Brachial Index (ABI) - do not check if billed separately X- 1 5 Vital Signs [name], [name] ([id number]) Has the patient been seen at the hospital within the last three years: Yes Total Score: 230 Level Of Care: New/Established - Level 5 Electronic Signature(s) Signed: 11/20/2018 11:56:24 AM By: Army Melia Entered By: Army Melia on 11/20/2018 10:06:00 [name], [name] ([id number]) -------------------------------------------------------------------------------- Encounter Discharge Information Details Patient Name: [name], [name] Date of Service: 11/20/2018 9:30 AM Medical Record Number: [id number] Patient Account Number: [id number] Date of Birth/Sex: 11/24/1950 (68 y.o. M) Treating RN: Army Melia Primary Care Merna Baldi: Lamonte Sakai Other Clinician: Referring Sendy Pluta: Lamonte Sakai Treating Deston Bilyeu/Extender: Melburn Hake, HOYT Weeks in Treatment: 112 Encounter Discharge Information Items Discharge Condition: Stable Ambulatory Status: Ambulatory Discharge Destination: Home Transportation: Private Auto Accompanied By: self Schedule Follow-up Appointment: Yes Clinical Summary of Care: Electronic Signature(s) Signed: 11/20/2018 11:56:24 AM By: Army Melia Entered By: Army Melia on 11/20/2018 10:20:23 Hartner, [name] ([id number]) -------------------------------------------------------------------------------- Lower Extremity Assessment Details Patient Name: [name], [name] Date of Service: 11/20/2018 9:30 AM Medical Record Number: [id number] Patient Account Number: [id number] Date of Birth/Sex: [birth year] (68 y.o. M) Treating RN: Montey Hora Primary Care Ercie Eliasen: Lamonte Sakai Other Clinician: Referring Detrice Cales: Lamonte Sakai Treating Pete Merten/Extender: Melburn Hake, HOYT Weeks in Treatment: 112 Edema Assessment Assessed: [Left: No] [Right: No] Edema: [Left: Yes] [Right: Yes] Vascular Assessment Pulses: Dorsalis Pedis Palpable: [Left:Yes] [Right:Yes] Electronic Signature(s) Signed: 11/20/2018 4:45:21 PM By: Montey Hora Entered By: Montey Hora on 11/20/2018 09:42:52 Lunden, Jaben E. ([id number]) -------------------------------------------------------------------------------- Multi Wound Chart Details Patient Name: [name], [name] Date of Service: 11/20/2018 9:30 AM Medical Record Number: [id number] Patient Account Number: [id number] Date of Birth/Sex: [birth year] (68 y.o. M) Treating RN: Army Melia Primary Care Sharryn Belding: Lamonte Sakai Other Clinician: Referring Chauntae Hults: Lamonte Sakai Treating Torah Pinnock/Extender: Melburn Hake, HOYT Weeks in Treatment: 112 Vital Signs Height(in): 82 Pulse(bpm): 28 Weight(lbs): 168 Blood Pressure(mmHg): 143/65 Body Mass Index(BMI):  25 Temperature(F): 98.6 Respiratory Rate 16 (breaths/min): Photos: Wound Location: Right Toe Great - Plantar Right Toe Great - Dorsal Right Toe Second Wounding Event: Not Known Gradually Appeared Gradually Appeared Primary Etiology: Diabetic Wound/Ulcer of 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 Lower Extremity Lower Extremity Comorbid History: Anemia, Lymphedema, Anemia, Lymphedema, Anemia, Lymphedema, Congestive Heart Failure, Congestive Heart Failure, Congestive Heart Failure, Hypertension, Peripheral Hypertension, Peripheral Hypertension, Peripheral Venous Disease, Type II Venous Disease, Type II Venous Disease, Type II Diabetes,  Neuropathy Diabetes, Neuropathy Diabetes, Neuropathy Date Acquired: 10/29/2017 05/22/2018 06/11/2016 Weeks of Treatment: 55 26 112 Wound Status: Open Open Open Pending Amputation on No No Yes Presentation: Measurements L x W x D 0.7x1x0.1 0.1x0.1x0.1 0.7x0.3x0.1 (cm) Area (cm) : 0.55 0.008 0.165 Volume (cm) : 0.055 0.001 0.016 % Reduction in Area: 65.70% 99.00% 94.60% % Reduction in Volume: 65.60% 98.80% 94.80% Classification: Grade 1 Grade 1 Grade 1 Exudate Amount: Large Large Large Exudate Type: Serous Serous Serous Exudate Color: amber amber amber Wound Margin: Flat and Intact Flat and Intact Flat and Intact Granulation Amount: Large (67-100%) Large (67-100%) Large (67-100%) Granulation Quality: Pink Pale Pink Necrotic Amount: None Present (0%) None Present (0%) None Present (0%) Exposed Structures: Kirtz, Piero E. ([id number]) Fat Layer (Subcutaneous Fat Layer (Subcutaneous Fat Layer (Subcutaneous Tissue) Exposed: Yes Tissue) Exposed: Yes Tissue) Exposed: Yes Epithelialization: Small (1-33%) Large (67-100%) Medium (34-66%) Wound Number: 20 21 22  Photos: Wound Location: Left Toe Second Left Toe Great Right Foot - Dorsal, Distal Wounding Event: Pressure Injury Shear/Friction Gradually Appeared Primary  Etiology: Diabetic Wound/Ulcer of 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 Lower Extremity Lower Extremity Comorbid History: Anemia, Lymphedema, Anemia, Lymphedema, Anemia, Lymphedema, Congestive Heart Failure, Congestive Heart Failure, Congestive Heart Failure, Hypertension, Peripheral Hypertension, Peripheral Hypertension, Peripheral Venous Disease, Type II Venous Disease, Type II Venous Disease, Type II Diabetes, Neuropathy Diabetes, Neuropathy Diabetes, Neuropathy Date Acquired: 09/22/2018 10/13/2018 10/13/2018 Weeks of Treatment: 8 5 5  Wound Status: Open Open Open Pending Amputation on No No No Presentation: Measurements L x W x D 0.5x0.8x0.1 0.7x0.7x0.1 5.5x7x0.1 (cm) Area (cm) : 0.314 0.385 30.238 Volume (cm) : 0.031 0.038 3.024 % Reduction in Area: 92.00% 90.20% -185.20% % Reduction in Volume: 92.10% 90.30% -185.30% Classification: Grade 1 Grade 1 Grade 1 Exudate Amount: Medium Small Large Exudate Type: Serous Serous Serous Exudate Color: amber amber amber Wound Margin: Flat and Intact Flat and Intact Flat and Intact Granulation Amount: Large (67-100%) Large (67-100%) Large (67-100%) Granulation Quality: Pink Pink Pink Necrotic Amount: None Present (0%) None Present (0%) None Present (0%) Exposed Structures: Fat Layer (Subcutaneous Fascia: No Fascia: No Tissue) Exposed: Yes Fat Layer (Subcutaneous Fat Layer (Subcutaneous Tissue) Exposed: No Tissue) Exposed: No Tendon: No Tendon: No Muscle: No Muscle: No Joint: No Joint: No Bone: No Bone: No Limited to Skin Breakdown Limited to Skin Breakdown Epithelialization: Medium (34-66%) Large (67-100%) Large (67-100%) Treatment Notes Electronic Signature(s) Signed: 11/20/2018 11:56:24 AM By: Garald Braver ([id number]) Entered By: Army Melia on 11/20/2018 10:02:59 Youtz, [name]  ([id number]) -------------------------------------------------------------------------------- [place] Details Patient Name: [name], [name] Date of Service: 11/20/2018 9:30 AM Medical Record Number: [id number] Patient Account Number: [id number] Date of Birth/Sex: 10-Aug-1950 (68 y.o. M) Treating RN: Army Melia Primary Care Frederick Klinger: Lamonte Sakai Other Clinician: Referring Tiron Suski: Lamonte Sakai Treating Terralyn Matsumura/Extender: Melburn Hake, HOYT Weeks in Treatment: 14 Active Inactive Abuse / Safety / Falls / Self Care Management Nursing Diagnoses: Potential for falls Goals: Patient will remain injury free related to falls Date Initiated: 09/21/2016 Target Resolution Date: 09/14/2017 Goal Status: Active Interventions: Assess fall risk on admission and as needed Notes: Nutrition Nursing Diagnoses: Potential for alteratiion in Nutrition/Potential for imbalanced nutrition Goals: Patient/caregiver agrees to and verbalizes understanding of need to use nutritional supplements and/or vitamins as prescribed Date Initiated: 09/21/2016 Target Resolution Date: 09/14/2017 Goal Status: Active Interventions: Assess patient nutrition upon admission and as needed per policy Notes: Orientation to the Wound Care Program Nursing Diagnoses: Knowledge deficit related to  the wound healing center program Goals: Patient/caregiver will verbalize understanding of the [place] Date Initiated: 09/21/2016 Target Resolution Date: 06/15/2017 Goal Status: Active Interventions: Provide education on orientation to the wound center Trella, Teron E. ([id number]) Notes: Wound/Skin Impairment Nursing Diagnoses: Knowledge deficit related to smoking impact on wound healing Goals: Ulcer/skin breakdown will have a volume reduction of 30% by week 4 Date Initiated: 09/21/2016 Target Resolution Date: 08/17/2017 Goal Status: Active Ulcer/skin breakdown will have a volume reduction of 50% by  week 8 Date Initiated: 09/21/2016 Target Resolution Date: 08/17/2017 Goal Status: Active Ulcer/skin breakdown will have a volume reduction of 80% by week 12 Date Initiated: 09/21/2016 Target Resolution Date: 09/14/2017 Goal Status: Active Ulcer/skin breakdown will heal within 14 weeks Date Initiated: 09/21/2016 Target Resolution Date: 08/17/2017 Goal Status: Active Interventions: Assess patient/caregiver ability to obtain necessary supplies Assess patient/caregiver ability to perform ulcer/skin care regimen upon admission and as needed Assess ulceration(s) every visit Notes: Electronic Signature(s) Signed: 11/20/2018 11:56:24 AM By: Army Melia Entered By: Army Melia on 11/20/2018 10:02:48 [name], [name] ([id number]) -------------------------------------------------------------------------------- Pain Assessment Details Patient Name: [name], [name] Date of Service: 11/20/2018 9:30 AM Medical Record Number: [id number] Patient Account Number: [id number] Date of Birth/Sex: 04/07/1951 (68 y.o. M) Treating RN: Army Melia Primary Care Kobyn Kray: Lamonte Sakai Other Clinician: Referring Kathalene Sporer: Lamonte Sakai Treating Waqas Bruhl/Extender: Melburn Hake, HOYT Weeks in Treatment: 112 Active Problems Location of Pain Severity and Description of Pain Patient Has Paino No Site Locations Pain Management and Medication Current Pain Management: Electronic Signature(s) Signed: 11/20/2018 11:56:24 AM By: Army Melia Signed: 11/20/2018 4:02:39 PM By: Lorine Bears RCP, RRT, CHT Entered By: Lorine Bears on 11/20/2018 09:40:34 [name], [name] ([id number]) -------------------------------------------------------------------------------- Patient/Caregiver Education Details Patient Name: Shroff, Mustaf E. Date of Service: 11/20/2018 9:30 AM Medical Record Number: [id number] Patient Account Number: [id number] Date of Birth/Gender: [birth year] (68 y.o. M) Treating RN: Army Melia Primary Care Physician: Lamonte Sakai Other Clinician: Referring Physician: Lamonte Sakai Treating Physician/Extender: Sharalyn Ink in Treatment: 112 Education Assessment Education Provided To: Patient Education Topics Provided Wound/Skin Impairment: Handouts: Caring for Your Ulcer Methods: Demonstration, Explain/Verbal Responses: State content correctly Electronic Signature(s) Signed: 11/20/2018 11:56:24 AM By: Army Melia Entered By: Army Melia on 11/20/2018 10:06:15 Savannah, [name] ([id number]) -------------------------------------------------------------------------------- Wound Assessment Details Patient Name: Loving, Darcey E. Date of Service: 11/20/2018 9:30 AM Medical Record Number: [id number] Patient Account Number: [id number] Date of Birth/Sex: Feb 07, 1951 (68 y.o. M) Treating RN: Montey Hora Primary Care Cailee Blanke: Lamonte Sakai Other Clinician: Referring Taaj Hurlbut: Lamonte Sakai Treating Belia Febo/Extender: Melburn Hake, HOYT Weeks in Treatment: 112 Wound Status Wound Number: 16 Primary Diabetic Wound/Ulcer of the Lower Extremity Etiology: Wound Location: Right Toe Great - Plantar Wound Open Wounding Event: Not Known Status: Date Acquired: 10/29/2017 Comorbid Anemia, Lymphedema, Congestive Heart Weeks Of Treatment: 55 History: Failure, Hypertension, Peripheral Venous Clustered Wound: No Disease, Type II Diabetes, Neuropathy Photos Wound Measurements Length: (cm) 0.7 Width: (cm) 1 Depth: (cm) 0.1 Area: (cm) 0.55 Volume: (cm) 0.055 % Reduction in Area: 65.7% % Reduction in Volume: 65.6% Epithelialization: Small (1-33%) Tunneling: No Undermining: No Wound Description Classification: Grade 1 Wound Margin: Flat and Intact Exudate Amount: Large Exudate Type: Serous Exudate Color: amber Foul Odor After Cleansing: No Slough/Fibrino No Wound Bed Granulation Amount: Large (67-100%) Exposed Structure Granulation Quality: Pink Fat Layer  (Subcutaneous Tissue) Exposed: Yes Necrotic Amount: None Present (0%) Electronic Signature(s) Signed: 11/20/2018 4:45:21 PM By: Montey Hora Entered By: Montey Hora on 11/20/2018 09:51:16 Lombardozzi, Guinn E. (  [id number]) -------------------------------------------------------------------------------- Wound Assessment Details Patient Name: Janota, AVIYON HOCEVAR. Date of Service: 11/20/2018 9:30 AM Medical Record Number: [id number] Patient Account Number: [id number] Date of Birth/Sex: [birth year] (68 y.o. M) Treating RN: Montey Hora Primary Care Akirra Lacerda: Lamonte Sakai Other Clinician: Referring Agueda Houpt: Lamonte Sakai Treating Raeford Brandenburg/Extender: Melburn Hake, HOYT Weeks in Treatment: 112 Wound Status Wound Number: 18 Primary Diabetic Wound/Ulcer of the Lower Extremity Etiology: Wound Location: Right Toe Great - Dorsal Wound Open Wounding Event: Gradually Appeared Status: Date Acquired: 05/22/2018 Comorbid Anemia, Lymphedema, Congestive Heart Weeks Of Treatment: 26 History: Failure, Hypertension, Peripheral Venous Clustered Wound: No Disease, Type II Diabetes, Neuropathy Photos Wound Measurements Length: (cm) 0.1 Width: (cm) 0.1 Depth: (cm) 0.1 Area: (cm) 0.008 Volume: (cm) 0.001 % Reduction in Area: 99% % Reduction in Volume: 98.8% Epithelialization: Large (67-100%) Tunneling: No Undermining: No Wound Description Classification: Grade 1 Wound Margin: Flat and Intact Exudate Amount: Large Exudate Type: Serous Exudate Color: amber Foul Odor After Cleansing: No Slough/Fibrino No Wound Bed Granulation Amount: Large (67-100%) Exposed Structure Granulation Quality: Pale Fat Layer (Subcutaneous Tissue) Exposed: Yes Necrotic Amount: None Present (0%) Electronic Signature(s) Signed: 11/20/2018 4:45:21 PM By: Montey Hora Entered By: Montey Hora on 11/20/2018 09:51:58 Haring, Jakyron E.  ([id number]) -------------------------------------------------------------------------------- Wound Assessment Details Patient Name: Pino, Shonta E. Date of Service: 11/20/2018 9:30 AM Medical Record Number: [id number] Patient Account Number: [id number] Date of Birth/Sex: 06-12-1950 (68 y.o. M) Treating RN: Montey Hora Primary Care Kaaliyah Kita: Lamonte Sakai Other Clinician: Referring Daviana Haymaker: Lamonte Sakai Treating Jachin Coury/Extender: Melburn Hake, HOYT Weeks in Treatment: 112 Wound Status Wound Number: 2 Primary Diabetic Wound/Ulcer of the Lower Extremity Etiology: Wound Location: Right Toe Second Wound Open Wounding Event: Gradually Appeared Status: Date Acquired: 06/11/2016 Comorbid Anemia, Lymphedema, Congestive Heart Weeks Of Treatment: 112 History: Failure, Hypertension, Peripheral Venous Clustered Wound: No Disease, Type II Diabetes, Neuropathy Pending Amputation On Presentation Photos Wound Measurements Length: (cm) 0.7 Width: (cm) 0.3 Depth: (cm) 0.1 Area: (cm) 0.165 Volume: (cm) 0.016 % Reduction in Area: 94.6% % Reduction in Volume: 94.8% Epithelialization: Medium (34-66%) Tunneling: No Undermining: No Wound Description Classification: Grade 1 Foul O Wound Margin: Flat and Intact Slough Exudate Amount: Large Exudate Type: Serous Exudate Color: amber dor After Cleansing: No /Fibrino No Wound Bed Granulation Amount: Large (67-100%) Exposed Structure Granulation Quality: Pink Fat Layer (Subcutaneous Tissue) Exposed: Yes Necrotic Amount: None Present (0%) Electronic Signature(s) Signed: 11/20/2018 4:45:21 PM By: Montey Hora Entered By: Montey Hora on 11/20/2018 09:52:44 Goldwire, Bryar E. ([id number]) Colomb, Schuyler E. ([id number]) -------------------------------------------------------------------------------- Wound Assessment Details Patient Name: Schillo, Jane E. Date of Service: 11/20/2018 9:30 AM Medical Record Number: [id number] Patient Account Number:  [id number] Date of Birth/Sex: [birth year] (68 y.o. M) Treating RN: Montey Hora Primary Care Arbie Reisz: Lamonte Sakai Other Clinician: Referring Nayleah Gamel: Lamonte Sakai Treating Elizandro Laura/Extender: Melburn Hake, HOYT Weeks in Treatment: 112 Wound Status Wound Number: 20 Primary Diabetic Wound/Ulcer of the Lower Extremity Etiology: Wound Location: Left Toe Second Wound Open Wounding Event: Pressure Injury Status: Date Acquired: 09/22/2018 Comorbid Anemia, Lymphedema, Congestive Heart Weeks Of Treatment: 8 History: Failure, Hypertension, Peripheral Venous Clustered Wound: No Disease, Type II Diabetes, Neuropathy Photos Wound Measurements Length: (cm) 0.5 Width: (cm) 0.8 Depth: (cm) 0.1 Area: (cm) 0.314 Volume: (cm) 0.031 % Reduction in Area: 92% % Reduction in Volume: 92.1% Epithelialization: Medium (34-66%) Tunneling: No Undermining: No Wound Description Classification: Grade 1 Foul O Wound Margin: Flat and Intact Slough Exudate Amount: Medium Exudate Type: Serous Exudate Color: amber dor After Cleansing: No /Fibrino No Wound Bed Granulation Amount:  Large (67-100%) Exposed Structure Granulation Quality: Pink Fat Layer (Subcutaneous Tissue) Exposed: Yes Necrotic Amount: None Present (0%) Electronic Signature(s) Signed: 11/20/2018 4:45:21 PM By: Montey Hora Entered By: Montey Hora on 11/20/2018 09:53:16 Blust, [name] ([id number]) -------------------------------------------------------------------------------- Wound Assessment Details Patient Name: Ligman, Argil E. Date of Service: 11/20/2018 9:30 AM Medical Record Number: [id number] Patient Account Number: [id number] Date of Birth/Sex: 01-21-1951 (68 y.o. M) Treating RN: Montey Hora Primary Care Lounette Sloan: Lamonte Sakai Other Clinician: Referring Charita Lindenberger: Lamonte Sakai Treating Loui Massenburg/Extender: Melburn Hake, HOYT Weeks in Treatment: 112 Wound Status Wound Number: 21 Primary Diabetic Wound/Ulcer of the Lower  Extremity Etiology: Wound Location: Left Toe Great Wound Open Wounding Event: Shear/Friction Status: Date Acquired: 10/13/2018 Comorbid Anemia, Lymphedema, Congestive Heart Weeks Of Treatment: 5 History: Failure, Hypertension, Peripheral Venous Clustered Wound: No Disease, Type II Diabetes, Neuropathy Photos Wound Measurements Length: (cm) 0.7 % Reduction in Width: (cm) 0.7 % Reduction in Depth: (cm) 0.1 Epithelializat Area: (cm) 0.385 Tunneling: Volume: (cm) 0.038 Undermining: Area: 90.2% Volume: 90.3% ion: Large (67-100%) No No Wound Description Classification: Grade 1 Foul Odor Aft Wound Margin: Flat and Intact Slough/Fibrin Exudate Amount: Small Exudate Type: Serous Exudate Color: amber er Cleansing: No o No Wound Bed Granulation Amount: Large (67-100%) Exposed Structure Granulation Quality: Pink Fascia Exposed: No Necrotic Amount: None Present (0%) Fat Layer (Subcutaneous Tissue) Exposed: No Tendon Exposed: No Muscle Exposed: No Joint Exposed: No Bone Exposed: No Limited to Skin Breakdown ANTIONNE, ENRIQUE ([id number]) Electronic Signature(s) Signed: 11/20/2018 4:45:21 PM By: Montey Hora Entered By: Montey Hora on 11/20/2018 09:53:51 [place], [name] ([id number]) -------------------------------------------------------------------------------- Wound Assessment Details Patient Name: Ohanesian, Treyven E. Date of Service: 11/20/2018 9:30 AM Medical Record Number: [id number] Patient Account Number: [id number] Date of Birth/Sex: 21-Apr-1950 (68 y.o. M) Treating RN: Montey Hora Primary Care Maily Debarge: Lamonte Sakai Other Clinician: Referring Polly Barner: Lamonte Sakai Treating Bo Teicher/Extender: Melburn Hake, HOYT Weeks in Treatment: 112 Wound Status Wound Number: [id number] Primary Diabetic Wound/Ulcer of the Lower Extremity Etiology: Wound Location: Right Foot - Dorsal, Distal Wound Open Wounding Event: Gradually Appeared Status: Date Acquired: 10/13/2018 Comorbid Anemia,  Lymphedema, Congestive Heart Weeks Of Treatment: 5 History: Failure, Hypertension, Peripheral Venous Clustered Wound: No Disease, Type II Diabetes, Neuropathy Photos Wound Measurements Length: (cm) 5.5 % Reduction i Width: (cm) 7 % Reduction i Depth: (cm) 0.1 Epithelializa Area: (cm) 30.238 Tunneling: Volume: (cm) 3.024 Undermining: n Area: -185.2% n Volume: -185.3% tion: Large (67-100%) No No Wound Description Classification: Grade 1 Foul Odor Af Wound Margin: Flat and Intact Slough/Fibri Exudate Amount: Large Exudate Type: Serous Exudate Color: amber ter Cleansing: No no No Wound Bed Granulation Amount: Large (67-100%) Exposed Structure Granulation Quality: Pink Fascia Exposed: No Necrotic Amount: None Present (0%) Fat Layer (Subcutaneous Tissue) Exposed: No Tendon Exposed: No Muscle Exposed: No Joint Exposed: No Bone Exposed: No Limited to Skin Breakdown DENISE, BRAMBLETT ([id number]) Electronic Signature(s) Signed: 11/20/2018 4:45:21 PM By: Montey Hora Entered By: Montey Hora on 11/20/2018 09:54:24 Milian, [name] ([id number]) -------------------------------------------------------------------------------- Vitals Details Patient Name: Maybee, Jamesyn E. Date of Service: 11/20/2018 9:30 AM Medical Record Number: [id number] Patient Account Number: [id number] Date of Birth/Sex: 01/22/1951 (68 y.o. M) Treating RN: Army Melia Primary Care Nansi [place]: Lamonte Sakai Other Clinician: Referring Arnol Mcgibbon: Lamonte Sakai Treating Kysa Calais/Extender: Melburn Hake, HOYT Weeks in Treatment: 112 Vital Signs Time Taken: 09:35 Temperature (F): 98.6 Height (in): 69 Pulse (bpm): 65 Weight (lbs): 168 Respiratory Rate (breaths/min): 16 Body Mass Index (BMI): 24.8 Blood Pressure (mmHg): 143/65 Reference Range: 80 - 120 mg / dl  Electronic Signature(s) Signed: 11/20/2018 4:02:39 PM By: Lorine Bears RCP, RRT, CHT Entered By: Lorine Bears on  11/20/2018 09:41:08

## 2018-11-20 NOTE — Progress Notes (Addendum)
KHASIR, WOODROME ([id number]) Visit Report for 11/20/2018 Chief Complaint Document Details Patient Name: [name], [name] Date of Service: 11/20/2018 9:30 AM Medical Record Number: [id number] Patient Account Number: [id number] Date of Birth/Sex: [birth year] (68 y.o. M) Treating RN: Army Melia Primary Care Provider: Lamonte Sakai Other Clinician: Referring Provider: Lamonte Sakai Treating Provider/Extender: Melburn Hake, Devynn Scheff Weeks in Treatment: 112 Information Obtained from: Patient Chief Complaint Patient presents for treatment of an open diabetic ulcer to both feet Electronic Signature(s) Signed: 11/20/2018 9:52:59 AM By: Worthy Keeler PA-C Entered By: Worthy Keeler on 11/20/2018 09:52:58 Cregg, [name] ([id number]) -------------------------------------------------------------------------------- HPI Details Patient Name: [name], [name] Date of Service: 11/20/2018 9:30 AM Medical Record Number: [id number] Patient Account Number: [id number] Date of Birth/Sex: 10-03-1950 (68 y.o. M) Treating RN: Army Melia Primary Care Provider: Lamonte Sakai Other Clinician: Referring Provider: Lamonte Sakai Treating Provider/Extender: Melburn Hake, Jumar Greenstreet Weeks in Treatment: 30 History of Present Illness HPI Description: 68 year old patient here to see as for bilateral feet ulceration to on his left first and second toe and 2 on his right first and second toe, which she's had for about 4 months. 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 Donella Stade. The patient is also undergoing workup by medical oncology for a intra-abdominal lymphadenopathy. 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 He is also status post appendectomy, inguinal lymph node biopsy, rectal biopsy and  rectal examination under anesthesia. he currently smokes cigarettes about half packet a day. In March of this year he was seen by Dr. Hortencia Pilar, for evaluation of bilateral varicose veins and besides wearing compression stockings he had recommended laser ablation of the right and left great saphenous veins to eleviate the symptoms and complications of severe superficial venous reflux disease. He also recommended lymphedema pumps for better control of his lymphedema. The patient recently has had on 08/23/2016, right greater saphenous vein ablation with the laser energy Earlier lower extremity venous reflux examination done on 05/08/2016 showed no DVT or SVT both lower legs but incompetence of bilateral great saphenous veins was present. A lower arterial study was also done and there was no significant right lower and left lower extremity problems based on a normal toe brachial index bilaterally and the ABI was 1.21 the left and 1.23 on the right. His post ablation venous duplex examination showed successful ablation of the right GS vein with thrombus formation 2 below the right saphenofemoral junction. The deep system was patent without evidence of thrombosis and this was done on 08/30/2016. 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 Addendum: regarding his x-rays done today and x-ray of the left foot -- IMPRESSION: No objective evidence of osteomyelitis. There are soft tissue changes which may reflect cellulitis. X-ray of the right foot -- IMPRESSION:Findings compatible with cellulitis of the toes. No objective evidence of osteomyelitis is observed. 10/01/16 on evaluation today patient's wounds appeared to be doing some better. I did review the x-rays as well which showed no evidence of osteomyelitis although there was evidence on x-ray of cellulitis. He fortunately is not having  any discomfort although he continues to have some swelling. He does not remember being on any antibiotics  recently. 10/15/16 on evaluation today patient's wounds overall appear to be doing better although he does have a new location noted on the left foot. Fortunately he is not having significant pain. It almost has the appearance that something is rubbing on the end of his toes but he wears the open toe shoes and according to what he is telling me never wears anything that would rub on his foot. There is no evidence of infection and specifically no evidence of a fungal infection 10/22/16 On evaluation today patient's wounds appeared to be doing better compared to last week in regard to his bilateral lower extremities. Fortunately I happy with how things are progressing although he still has ulcers I feel like that he is improving and appropriate manner. 11/12/16 on evaluation today patient appears to be doing well in regard to his bilateral feet and the respective wounds. We have been using surrounding her dressings along with an antifungal cream which seems to be doing very well. He has no bilateral dysfunction noticed that the rituals are weight loss at this point. He also has no nausea or vomiting a note purulent discharge. He did see Vein and vascular today and he tells me that they told him he could have surgery for his venous stasis but they did not feel like it was worth it in his words. Fortunately patient's wounds do appear to be getting sneakily better. [name], [name] ([id number]) 11/26/2016 -- he says he is going to have some surgery during this week at [facility] for possibly a colon resection. 12/31/2016 -- the patient has been noncompliant with his smoking and I'm not sure whether he is also started drinking again. He continues to be very nonchalant about his care 01/14/2017 -- the patient's HandP has been reviewed well and I understand he is being compliant with trying to give up  smoking and his local dressing changes. He does not have any surgical options of 4 to him by his vascular surgeons.he was last seen in early August by Dr. Hortencia Pilar who recommended compression stockings,and possibly lymph pumps in 2-3 months after doing a review ultrasound. 01/28/2017 - the patient did not have any fresh complaints but on examination I noted a large lacerated wound on the plantar aspect of his right fourth toe which had a lot of necrotic debris and it probes down to bone. 02/07/2017 -- x-ray of the right foot -- IMPRESSION: Soft tissue swelling about the first through fourth toes consistent with cellulitis. New destructive change in the tuft of the distal phalanx of the great toe is consistent with osteomyelitis. 02/14/2017 -- the patient's MRI is pending this coming Monday and he still continues to smoke. We have again gone over off loading of his wounds in great detail and he says he's been compliant. 02/21/2017 -- MR of the right foot -- IMPRESSION: 1. Soft tissue ulcer at the tip of the first, second and third toe knows. Cortical irregularity and bone marrow edema in the first distal phalanx most concerning for osteomyelitis. Mild marrow edema in the second and third distal phalanx without definite cortical destruction which may reflect early osteomyelitis versus reactive marrow edema. 2. Soft tissue edema surrounding the first phalanx most consistent with cellulitis. the patient was also recently evaluated by his medical oncologist Dr. 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 She is treating him with weekly Procrit. She is also keeping intra-abdominal lymphadenopathy and right lower  lobe lung nodule under observation. 04/04/2017 -- he was seen by Dr. Adrian Prows on 03/25/2017 -- after review he empirically put him on ciprofloxacin and doxycycline as they have good bone penetration and good  bioavailability and it will cover the usual pathogens and diabetic foot osteomyelitis. He will check inflammatory markers and plan a 67-89 week old records. C-reactive protein was 0.3 and the ESR was 72 04/18/17 on evaluation today patient appears to be doing about the same in regard to his lower extremity wounds bilaterally. He has continued to use the antifungal cream which does seem to be beneficial. Nonetheless the ulcers do seem to in some areas be epithelial eyes over and in other areas are still open. He is having no significant discomfort. 04/25/17-he is here in follow-up evaluation for multiple ulcerations to multiple toes bilaterally. He states he did see Dr. Ola Spurr again last week and continues antibiotic therapy. He is voicing no complaints or concerns, will continue with current treatment plan will possibility of adding compression therapy next week after an additional week of treatment/lotions to BLE prescribed by Dr Ola Spurr 05/02/17 he is here in follow up for for multiple ulcers to multiple toes bilaterally. we will stop using antifungal cream and will continue with silvercel and follow up next week 05/09/17-he is here in follow-up for multiple ulcerations to multiple toes bilaterally. There is improvement in appearance. He has not completely stopped using antifungal cream, but admits he has not using it between the toes. He has an appointment with Dr. Ola Spurr on 2/11, continues on doxycycline and Cipro. It has been 5 weeks of antibiotic therapy, we will order plain film xray to evaluate for osteomyelitis next week, prior to follow up with ID. Will continue with silvercel and follow up next week 05/16/17-he is here in follow-up evaluation for multiple ulcerations to multiple toes bilaterally and new wound to the right posterior heel. There is essentially no change in appearance, deteriorating measurements; he has a history of waxing and waning measurements. He admits that he  continues to apply moisturizer/cream/ointment to his toes despite weekly reminders to only apply silvercel to his toes. He states that he thinks the surgical shoe contributed to the superficial ulcer to his posterior heel, he is unable to articulate if this was an area of dry cracked skin as he has a similar area to the left heel. He now is wearing open toed slippers. He has an appointment with Dr. Ola Spurr on 2/11. We have ordered x-rays for her bilateral feet; he was advised to obtain the x-rays today or tomorrow. He will follow-up next week 05/23/17-he is here in follow-up evaluation for multiple ulcerations to multiple toes bilaterally and the right posterior heel. There is improvement in maceration. He has been compliant and not applying any moisturizing agent to his toes. He has been using Lac-Hydrin for his lower extremities with improvement. He did not go to his appointment on Monday with Dr. Ola Spurr secondary to financial concerns. X-rays for her bilateral feet showed: LEFT FOOT with slight erosion of the tuft of the distal phalanges of the left first and second toe suspicious for osteomyelitis, RIGHT FOOT with 1.erosion of the tufts of the distal Goldfarb, Chriss E. ([id number]) phalanges of the right first second and possibly third toes consistent with osteomyelitis, 2 no definitive abnormality of the calcaneus is seen on the images obtained, 3. Plantar calcaneal degenerative spur. We briefly discussed hyperbaric adjunctive therapy for treatment of chronic refractory osteomyelitis. I do not find an a1c in  EMR, will contact PCP for record, or order if needed. He has been encouraged to contact Dr Ola Spurr office regarding the follow-up appointment, encouraged him to inquire about payment plan. We will continue with same treatment plan and follow-up next week. He states he is still taking antibiotics and has "a lot" left. He states he has been taking them as directed, 2 pills twice daily.  According to Dr. Blane Ohara office notes he was originally started on 12/17 for 4 weeks and extended on 1/14 for an additional 4 weeks. He should be done with his antibiotic therapy, he was advised to bring his bottles and to his next appointment, we will contact pharmacy. 05/30/17-he is here in follow-up evaluation for multiple ulcerations to multiple toes bilaterally and the right posterior heel. He is accompanied by his brother-in-law. Wounds are stable. He has yet to make up with Dr. Ola Spurr. We contacted his PCP, with no record of recent A1c we will draw an A1c. His brother-in-law states that he was taken off all of his diabetic medication secondary to kidney function. 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 The antibiotics were counted and have approximately 2 weeks left, although they should be complete. The pill organizer reveals missing days. We discussed the need for consistent medications, to have optimal benefit of medication. He has a cousin that lives with him and he will ask her to check his organizer daily. He has been advised to follow up with Dr Ola Spurr, and will go by the office today. He has been advised to quit smoking. 06/06/17-he is here in follow up evaluation. He has had to make an appointment with Dr. Ola Spurr. He did have blood work obtained, a1c 5. He continues to take antibiotic therapy. Significant improvement in bilateral lower extremity edema with compression therapy. Essentially no change in ulcerations to toes. He states he is "going to try something different" and "let me know next week" if it works; he would not provide any additional information and was encouraged to follow our orders. We will follow up next week 06/13/17-he is here in follow-up evaluation. He has an appointment with Dr. Ola Spurr tomorrow morning. He states he purchased an ointment from  [facility] and applied to his toes for 3 days, he does not remember the name of the ointment. There is improvement to his wounds, minimal maceration. He continues to take antibiotic therapy, this should have been completed last month. His brother-in-law who regularly accompanies his appointments was asked to take the bottles to the appointment tomorrow with Dr. Ola Spurr so he is aware. We will continue with 3 layer compression, and order OPEN TOE compression 20-30mmHg; we will apply compression stockings next week. He continues to smoke, smoked "2 cigarettes" last week 06/20/17 on evaluation today patient did receive his compression stockings which he has with him today for both lower extremities. With that being said he tells me at this point in time that he is very happy to have these he really is not a big fan of the compression wraps that we have been utilizing although they have been of great benefit for him. Nonetheless at this point he does want to switch to the compression stockings. In my opinion as long as he is continuing with compression I'm okay with the stockings or the wraps. 06/27/17-he is here in follow-up evaluation for multiple ulcerations to his bilateral toes. There is some improvement in appearance. He is compliant  in wearing his compression stockings with significant improvement in lower extremity edema. He saw Dr Ola Spurr on 3/8, per his notes they would redraw ESR and CRP; plan to continue antibiotic therapy if these remain elevated. I do not see an ESR or CRP level in Epic. The patient continues to take antibiotics. 07/11/17-He is here in follow-up evaluation for multiple ulcerations to multiple toes bilaterally. He presents with complete epithelialization to the right third toe; there has been no deterioration. He continues on antibiotic therapy. He will follow-up next week 07/18/17-He is here in follow-up evaluation for multiple ulcerations to multiple toes bilaterally. He  continues to make improvement. He continues on antibiotic therapy. He states he has been using something additional to our orders, he does not elaborate but states he will bring it in next week. 07/25/17-He is here in follow up evaluation for multiple ulcerations to bilateral toes. He is stable. He has completed antibiotic therapy. He admits to "filing" his toes after showers each evening, this is what he was referencing last week; he does not filing for the wounds. We will switch to [facility] and monitor for any improvement, he will follow-up next week 08/01/17-He is here in follow-up evaluation. He admits to "picking" at his toes after cleansing yesterday, leading to new areas of tissue loss on the bilateral second toe. There is improvement noted to the bilateral great toe. We will dress toes today and hope that they maintain until Monday where he will come in for a nurse visit. He has been advised, multiple times with expressed verbalization, to change the dressings to silvercel if the dressings get wet prior to [facility] appointment. 08/08/17-He is here in follow-up evaluation for bilateral first and second toe ulcerations. There is significant improvement to all ulcerations since last visit. We will switch to [name] to all wounds and he will continue with nurse visits on a Monday/Thursday schedule and follow-up with me in 2 weeks. He continues to smoke, 1-3 cigarettes per day, and has been encouraged to not smoke until his next follow-up in 2 weeks. 08/15/17 on evaluation today patient's ulcers on his toes actually appear to be doing fairly well the right to ulcers may be a little bit more moist compared to the left I'm not really sure exactly why as the openings appear to be very small and I'm not seeing any evidence of anything significant as far as you lightest or otherwise. Nonetheless this may just be a small setback he's Baskett, Braian E. ([id number]) been doing very well with the  [facility] Dressing 08/22/17-He is here in follow-up evaluation for ulcerations to his bilateral first and second toes, there is small/scant amount of drainage noted on today's dressing. He continues with Hydrofera Blue. He continues to smoke, 1 cigarette a day. Voices no complaint or concerns, compliant and compression therapy today. He'll follow-up next week 08/29/17-He is here in follow-up evaluation for ulcerations to his bilateral first and second toes, with a new wound to the left third toe. He states he cut himself while cutting the toenail. He presents today with more maceration and increased measurements; waxing and waning measurements and moisture has been an ongoing issue. He continues to smoke a proximally 1 cigarette per day, but states he has not smoked since Sunday. We will initiate medihoney daily and evaluate next week. He presents today without compression stockings 09/05/17-He is here in follow-up evaluation for ulcerations to bilateral first and second and left third toe. He did not pick up the  medihoney and therefore has not been changing his dressing. He admits to inconsistent where of compression stockings, admits to pain to his bilateral lower extremities with unilateral edema (right greater than left). The ulcerations to multiple toes are more macerated and larger in size than last week. He is wearing compression therapy today. He admits to an overall feeling of weakness and fatigue and has been encouraged to contact his PCP for evaluation; he has a known history of anemia and intraabdominal lymphadenopathy. We will return to silvercel and he will follow up next week 09/19/17-He is here in follow-up evaluation for ulcerations to bilateral first and second toe.he was unable to make last week's appointment. The culture that was obtained on 5/30 grew clindamycin sensitive MRSA; he was initiated on 6/4 but did not start it until 6/7. There is significant improvement in all  wounds, healing to left second and third toe. He also admits to having no alcohol or smoking for the last 6 days. We will continue with same treatment plan I will extend the clindamycin for an additional 10 days and he will follow-up next week. 09/26/17- He is here in follow evaluation for multiple ulcerations to multiple toes bilaterally. He continues to be non-compliant with dressing, compression therapy. He states he is taking the antibiotics as prescribed, although his recall is inconsistent. There is noted deterioration in all ulcers. We will continue same treatment plan, antibiotic therapy and he will follow up next week. 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 This information is obtained from the medical record, as he cannot articulate specifics to his diagnosis or treatment plan. 10/17/17-He is here in follow-up evaluation for multiple ulcerations to multiple toes bilaterally. There is chronic waxing and waning improvement/deterioration. On today's exam they appear improved, dry without maceration. He continues to apply a variety of topical agents, stating that he has switched between the blue product and silver product. In my opinion, this will be a chronic waxing and waning giving his intermittent compliance. He will follow-up in 2 weeks. As it relates to the biopsy taken from his buttock, he has not followed up with that and has not started radiation therapy. 10/31/17-He is here for evaluation for multiple ulcerations to multiple toes bilaterally. Continues with waxing and waning improvement versus deterioration. They are stable on today's exam without significant maceration. 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 We will continue with every 2 week follow-up appointments 11/14/17- He  is seen in follow-up evaluation for multiple ulcerations to multiple bilateral toes. He is scheduled for excision of basal cell carcinoma to his right buttock on Monday 8/12. He continues to have chronic waxing and waning of his ulcers; today is a stable day with minimal drainage and left ear is better than right toes. I will recount to infectious disease regarding chronic suppressive therapy. He admits to being more compliant with compression stockings, although he is not wearing any today. He has been encouraged to continue with dressing changes as ordered and compression therapy daily. He will be seen in 2 weeks 11/28/17-He is seen in follow up evaluation for multiple ulcerations to multiple toes bilaterally. He had an overnight admission at [facility] (8/11-8/12) for symptomatic anemia, received two units prbc. His surgical date has been moved to 9/94 excision of basal cell carcinoma to  the right buttock. He has been noncompliant in wearing compression stockings, applying topical treatment as ordered; he has been applying a "cream", Hydrofera Blue, silvercel. He is currently on no antibiotic therapy; we will send for re-referral to ID for suppressive therapy given his multiple co-morbidities, poor surgical candidate, unwilling to have toe amputation and general non-adherence. 12/19/17 on evaluation today patient actually appears to be doing very well in regard to his toes at least compared to last time I saw him. He again has been coming to our office for quite a bit of time. 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 He states is hurting a little bit but doing well in general. I'm see were not taking care of this area dermatology is. He does have his appointment with infectious disease upcoming which they re-consult for suppressive therapy that's next Monday. 01/02/18 on evaluation  today patient actually appears to be doing rather well in regard to his toe ulcers although he tells me he has been put in an ointment on this which I am concerned may be causing some additional moisture buildup which we really do not want. He supposed be using silver cell which is mainly thing I want him to be utilizing. Fortunately there does not appear to be any evidence of worsening infection which is good news. He has had surgery on his gluteal region where he had an excision of a cancerous area fortunately that seems to be doing well although he is having some discomfort. 02/06/18 on evaluation today patient actually appears to be doing well at all locations except for his second toe use bilaterally Sinor, Dunbar E. ([id number]) where the distal tip is still wrong open. He did state that he can't stand for dry skin to buildup on his toes subsequently as a question and further it sounds as if he actually pills this all which is likely keeping the toe is wrong and open. Also think that's what happened in the bottom of his foot although that appears to have healed at this point. There does not appear to be any evidence of infection. 02/20/18 on evaluation today patient appears to be doing better in regard to his foot ulcers. Everything has shown signs of improvement which is excellent news. Overall I'm very pleased with how things appear currently. The patient states he's not picking her point at any skin and that he wears shoes at all times when he is ambulating at home. Both are things that I've advocated and recommended for him in the past. 03/13/18 on evaluation today patient actually appears to be doing very well in regard to the bilateral toes in the overall appearance of the wound bed locations. Fortunately there does not appear to be any signs of infection at this time. He states that he is very pleased with how things seem to be progressing to be honest as am I. 03/27/1899 evaluation today  patient appears to be doing well in regard to his toe ulcers. He just has a few areas remaining and these appear to be showing signs of improvement as well which is great news. Overall I have been impressed with the progress of the past several weeks. 04/10/18 on evaluation today patient appears to be doing a little bit worse in regard to the right plantar foot at the regional the first metatarsal as well as the left second toe. He tells me that  he ran out of bandages he also tells me he's been wearing his flip-flops a lot at home as well as a new pair of boots that he received as well. This is as opposed to the postop shoes that we had previously given him. Nonetheless overall I feel like that he has had a little bit more friction to the area which is causing things to look a little bit worse today. 04/24/18 on evaluation today patient's wounds for the most part appear to be doing excellent especially in regard to the bilateral second toes. With that being said he does have issues with the wound still on the first metatarsal region and first toe of the right foot which have not completely closed at this point. Nonetheless I do feel like that he is making progress which is good news. 05/08/18 on evaluation today patient's wounds appear to be doing just a little bit worse than normal simply due to the fact that he has not had dressing supplies. He apparently owed $35 in order for them to be shipped and unfortunately is not able to pay that currently. We did give him information for the [employer] today which will hopefully help cover his dressing supplies. He was thankful for this he states is gonna contact them this afternoon. Fortunately otherwise or does not hear any signs of infection. 05/22/18 on evaluation today patient appears to be doing somewhat poorly in regard to his toes. Upon further questioning it appears that he actually has been pulling skin off of the toes which "drive  him crazy". Subsequently as he does this he calls his new areas to tear open and I think this is one of the reasons why his toes are not healing as appropriately as we would like. He fortunately has no signs of infection at this time. With that being said I'm still concerned about the possibility of a fungal infection even though there doesn't appear to be anything bacterial. Potentially nystatin powder could be of benefit for him as far as trying to help this in that regard. 06/05/18 on evaluation today patient actually appears to be doing very well in regard to his foot ulcers. Fortunately there's no signs of infection. He's been tolerating the dressing changes without complication. I'm very pleased with how things seem to be progressing. Fortunately there's no evidence of active infection at this point I do believe nystatin powder has been of benefit for him. 06/19/18 on evaluation today patient actually appears to be doing a little worse compared to last evaluation. Tells me that he has not had any supplies for two weeks. With that being said his plantar foot wounds on the right actually appear to be doing very well. It's actually a second toes in the first toes that are the worst. I think these are areas the was able to get to and pick and he tells me in fact that he destine to pick out some of the dry skin because it causes him annoyance. Nonetheless I think this is somewhat detrimental to him I think it's worth keeping part of the wound open in particular. Fortunately there is no sign of infection at this time although I think that is a [name] which I discussed specifically that he's not careful you can end up reopening this and causing greater problems for himself. 07/03/18 on evaluation today patient's wound bed actually shows evidence of good granulation at this time and epithelialization as far as the wounds in general are concerned. He  has been tolerating the dressing changes including the  nystatin without complication. This is good news. With that being said he seems to be still having some trouble with the left second toe distally at this point. 4/9; most of the wounds look quite satisfactory. He requires debridement of the plantar right great toe otherwise everything Warn, Beryle E. ([id number]) looks superficial to improved. His left second toe is healed. 08/14/18 on evaluation today patient actually appears to be doing very well in regard to his foot ulcerations. In fact he just has three small areas of the right foot still remaining open the left foot is closed and dry at this point. Overall I feel like he is doing quite well which is excellent news. Fortunately there's no signs of active infection. 08/28/18 on evaluation today patient's toes of the right foot actually shows signs of being a little bit worse in regard to the overall open areas at this point. He does tell me that he "washes them well" when he is taking a shower. Again I explained that I want to make sure he's not washing them to aggressively as this could potentially cause issues with overall worsening. 09/12/18 on evaluation today patient actually appears to be doing well in regard to his right foot ulcers. He's been tolerating the dressing changes without complication. Fortunately there's no signs of active infection at this time. With that being said he does have a new one on the dorsal surface of his foot just proximal to his toes he does not know really where this came from. 09/25/18 on evaluation today patient actually appears to be doing somewhat better in regard to his right foot ulcers although he has a new opening on the left second toe unfortunately. This is something that seems to be back and forth he tells me did Pickett scan at this area that is why it's now open yet again. No fevers, chills, nausea, or vomiting noted at this time. 10/16/18 on evaluation today patient actually appears to be doing decently  poorly in regard to his ulcers on his feet. He tells me that for the past 3-4 days he is not having to take therefore is not been putting on any dressings he came in with the socks actually just stuck to the toes with a been draining. He also has not picked up the nystatin powder that was sent to the pharmacy with refills all he has to do is go pick that up he has not done so. He also told me for the more that he feels like his "blood is low" which seems to be referring to his hemoglobin he [facility] and subsequently states he needs to go there when he leaves here in order to see about a blood transfusion. Nonetheless I think that's something that he definitely needs to get on top of if he feels like that's necessary. Fortunately there's no signs of active infection at this time. 10/30/18 on evaluation today patient appears to be doing a little worse in regard to his lower extremity ulcers specifically his toes for the most part although he does have an open wound on the right dorsal foot. He has been picking at his toes which damages the skin and leads to what we are seeing today. Fortunately there is no signs of active infection systemically or locally. With that being said I am concerned about the fact that I'm not sure he's ever really gonna feel as long  as he keeps messing with his toes such as picking and bothering them which causes them to reopen on a regular basis. 11/13/18 upon inspection today patient appears to be doing somewhat more poorly currently compared to the last time I saw him. He still has not been wearing his compression stockings even if he did they do not cover the toes which I think may be more of a complication problem for him to be honest. He many compression stockings that do indeed cover his toes but he also needs to have the willpower to actually wear these in a regular basis so that they actually do him any good at all. He also tells  me that he is out of dressing supplies and that he needs to go pick some up so that he will have them. Same is true of the powder although he tells me it's at the pharmacy he just has to go pick it up. 11/20/18 on evaluation today patient actually appears to be doing better with regard to his toe ulcers at this point. Fortunately he does seem to be perform the dressing changes appropriately and this has shown signs of improvement as best I can tell today. He did tell me that he attempted to use his compression stocking for about 15 minutes this morning before coming into the clinic unfortunately he tells me this caused him a lot of pain. I am not sure if there the proper size he was unable to get the new ones that we had ordered for some reason they did not get sent I am assuming this has something to do with insurance. Electronic Signature(s) Signed: 11/23/2018 11:39:45 PM By: Worthy Keeler PA-C Previous Signature: 11/20/2018 10:15:45 AM Version By: Worthy Keeler PA-C Entered By: Worthy Keeler on 11/23/2018 23:32:39 Vaeth, [name] ([id number]) -------------------------------------------------------------------------------- Physical Exam Details Patient Name: [name], [name] Date of Service: 11/20/2018 9:30 AM Medical Record Number: [id number] Patient Account Number: [id number] Date of Birth/Sex: [birth year] (68 y.o. M) Treating RN: Army Melia Primary Care Provider: Lamonte Sakai Other Clinician: Referring Provider: Lamonte Sakai Treating Provider/Extender: Melburn Hake, Darryel Diodato Weeks in Treatment: 8 Constitutional Well-nourished and well-hydrated in no acute distress. Respiratory normal breathing without difficulty. clear to auscultation bilaterally. Cardiovascular regular rate and rhythm with normal S1, S2. Psychiatric this patient is able to make decisions and demonstrates good insight into disease process. Alert and Oriented x 3. pleasant and cooperative. Notes Upon inspection  patient's toes actually appear to be doing better today than they were last week which is good news. I think that weekly follow-up appointments might be better for him as opposed to waiting 2 weeks in between. I am able to keep a much better eye on him which is 1 good part of this. Unfortunately he could not wear the compression stockings. Electronic Signature(s) Signed: 11/20/2018 10:37:18 AM By: Worthy Keeler PA-C Previous Signature: 11/20/2018 10:17:25 AM Version By: Worthy Keeler PA-C Entered By: Worthy Keeler on 11/20/2018 10:37:17 Parmelee, [name] ([id number]) -------------------------------------------------------------------------------- Physician Orders Details Patient Name: Powley, Alyaan E. Date of Service: 11/20/2018 9:30 AM Medical Record Number: [id number] Patient Account Number: [id number] Date of Birth/Sex: [birth year] (68 y.o. M) Treating RN: Army Melia Primary Care Provider: Lamonte Sakai Other Clinician: Referring Provider: Lamonte Sakai Treating Provider/Extender: Melburn Hake, Lesley Galentine Weeks in Treatment: 86 Verbal / Phone Orders: No Diagnosis Coding ICD-10 Coding Code Description L97.521 Non-pressure chronic ulcer of other part of left foot limited to breakdown of skin L97.511 Non-pressure chronic  ulcer of other part of right foot limited to breakdown of skin E11.621 Type 2 diabetes mellitus with foot ulcer I87.323 Chronic venous hypertension (idiopathic) with inflammation of bilateral lower extremity I89.0 Lymphedema, not elsewhere classified F17.218 Nicotine dependence, cigarettes, 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 abuse 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, right ankle and foot Wound Cleansing Wound #16 Right,Plantar Toe Great o Clean wound with Normal Saline. Anesthetic (add to Medication List) Wound #16 Right,Plantar Toe Great o Topical Lidocaine 4% cream applied to wound bed prior to debridement  (In Clinic Only). Wound #18 Right,Dorsal Toe Great o Topical Lidocaine 4% cream applied to wound bed prior to debridement (In Clinic Only). Wound #2 Right Toe Second o Topical Lidocaine 4% cream applied to wound bed prior to debridement (In Clinic Only). Wound #20 Left Toe Second o Topical Lidocaine 4% cream applied to wound bed prior to debridement (In Clinic Only). Wound #21 Left Toe Great o Topical Lidocaine 4% cream applied to wound bed prior to debridement (In Clinic Only). Wound #22 Right,Distal,Dorsal Foot o Topical Lidocaine 4% cream applied to wound bed prior to debridement (In Clinic Only). Skin Barriers/Peri-Wound Care Wound #16 Right,Plantar Toe Great o Antifungal powder-Nystatin Wound #18 Right,Dorsal Toe Great o Antifungal powder-Nystatin Wound #2 Right Toe Second Alonge, Sael E. ([id number]) o Antifungal powder-Nystatin Wound #20 Left Toe Second o Antifungal powder-Nystatin Wound #21 Left Toe Great o Antifungal powder-Nystatin Wound #22 Right,Distal,Dorsal Foot o Antifungal powder-Nystatin Primary Wound Dressing Wound #16 Right,Plantar Toe Great o Silver Alginate - secure with tape Wound #18 Right,Dorsal Toe Great o Silver Alginate - secure with tape Wound #2 Right Toe Second o Silver Alginate - secure with tape Wound #20 Left Toe Second o Silver Alginate - secure with tape Wound #21 Left Toe Great o Silver Alginate - secure with tape Wound #22 Right,Distal,Dorsal Foot o Silver Alginate - secure with tape Dressing Change Frequency Wound #16 Right,Plantar Toe Great o Change dressing every other day. Wound #18 Right,Dorsal Toe Great o Change dressing every other day. Wound #2 Right Toe Second o Change dressing every other day. Wound #20 Left Toe Second o Change dressing every other day. Wound #21 Left Toe Great o Change dressing every other day. Wound #22 Right,Distal,Dorsal Foot o Change dressing every other  day. Follow-up Appointments Wound #16 Right,Plantar Toe Great o Return Appointment in 1 week. Wound #18 Right,Dorsal Toe Great o Return Appointment in 1 week. KEYDEN, PAVLOV ([id number]) Wound #2 Right Toe Second o Return Appointment in 1 week. Wound #20 Left Toe Second o Return Appointment in 1 week. Wound #21 Left Toe Great o Return Appointment in 1 week. Wound #22 Right,Distal,Dorsal Foot o Return Appointment in 1 week. Edema Control Wound #16 Right,Plantar Toe Great o Other: - double layer tubi grip Wound #18 Right,Dorsal Toe Great o Other: - double layer tubi grip Wound #2 Right Toe Second o Other: - double layer tubi grip Wound #20 Left Toe Second o Other: - double layer tubi grip Wound #21 Left Toe Great o Other: - double layer tubi grip Wound #22 Right,Distal,Dorsal Foot o Other: - double layer tubi grip Additional Orders / Instructions Wound #16 Right,Plantar Toe Great o Stop Smoking o Increase protein intake. Wound #18 Right,Dorsal Toe Great o Stop Smoking o Increase protein intake. Wound #2 Right Toe Second o Stop Smoking o Increase protein intake. Wound #20 Left Toe Second o Stop Smoking o Increase protein intake. Wound #21 Left Toe Great o Stop Smoking o Increase  protein intake. Wound #22 Right,Distal,Dorsal Foot o Stop Smoking o Increase protein intake. RANBIR, CHEW ([id number]) Medications-please add to medication list. Wound #16 Right,Plantar Toe Great o Other: - Nystatin Powder Wound #18 Right,Dorsal Toe Great o Other: - Nystatin Powder Wound #2 Right Toe Second o Other: - Nystatin Powder Wound #20 Left Toe Second o Other: - Nystatin Powder Wound #21 Left Toe Great o Other: - Nystatin Powder Wound #22 Right,Distal,Dorsal Foot o Other: - Nystatin Powder Consults o Vascular - ABI TBI bilateral Electronic Signature(s) Signed: 11/20/2018 11:56:24 AM By: Army Melia Signed:  11/20/2018 10:42:11 PM By: Worthy Keeler PA-C Entered By: Army Melia on 11/20/2018 10:42:45 Lufkin, [name] ([id number]) -------------------------------------------------------------------------------- Problem List Details Patient Name: [name], [name] Date of Service: 11/20/2018 9:30 AM Medical Record Number: [id number] Patient Account Number: [id number] Date of Birth/Sex: [birth year] (68 y.o. M) Treating RN: Army Melia Primary Care Provider: Lamonte Sakai Other Clinician: Referring Provider: Lamonte Sakai Treating Provider/Extender: Melburn Hake, Klara Stjames Weeks in Treatment: 112 Active Problems ICD-10 Evaluated Encounter Code Description Active Date Today Diagnosis L97.521 Non-pressure chronic ulcer of other part of left foot limited to 09/21/2016 No Yes breakdown of skin L97.511 Non-pressure chronic ulcer of other part of right foot limited to 09/21/2016 No Yes breakdown of skin E11.621 Type 2 diabetes mellitus with foot ulcer 09/21/2016 No Yes I87.323 Chronic venous hypertension (idiopathic) with inflammation of 09/21/2016 No Yes bilateral lower extremity I89.0 Lymphedema, not elsewhere classified 09/21/2016 No Yes F17.218 Nicotine dependence, cigarettes, with other nicotine-induced 09/21/2016 No Yes disorders F10.19 Alcohol abuse with unspecified alcohol-induced disorder 09/21/2016 No Yes M86.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, right ankle and foot 02/21/2017 No Yes Inactive Problems Resolved Problems Buehrle, LAUTARO KORAL ([id number]) Electronic Signature(s) Signed: 11/20/2018 9:52:51 AM By: Worthy Keeler PA-C Entered By: Worthy Keeler on 11/20/2018 09:52:50 Moffatt, [name] ([id number]) -------------------------------------------------------------------------------- Progress Note Details Patient Name: Miltenberger, Nicky E. Date of Service: 11/20/2018 9:30 AM Medical Record Number: [id number] Patient Account Number: [id number] Date of Birth/Sex: 08-Jul-1950 (68 y.o. M) Treating RN: Army Melia Primary Care Provider: Lamonte Sakai Other Clinician: Referring Provider: Lamonte Sakai Treating Provider/Extender: Melburn Hake, Tiandre Teall Weeks in Treatment: 112 Subjective Chief Complaint Information obtained from Patient Patient presents for treatment of an open diabetic ulcer to both feet History of Present Illness (HPI) 68 year old patient here to see as for bilateral feet ulceration to on his left first and second toe and 2 on his right first and second toe, which she's had for about 4 months. 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 Donella Stade. The patient is also undergoing workup by medical oncology for a intra-abdominal lymphadenopathy. 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 He is also status post appendectomy, inguinal lymph node biopsy, rectal biopsy and rectal examination under anesthesia. he currently smokes cigarettes about half packet a day. In March of this year he was seen by Dr. Hortencia Pilar, for evaluation of bilateral varicose veins and besides wearing compression stockings he had recommended laser ablation of the right and left great saphenous veins to eleviate the symptoms and complications of severe superficial venous reflux disease. He also recommended lymphedema pumps for better control of his lymphedema. The patient recently has had on 08/23/2016, right greater saphenous vein ablation with the laser energy Earlier lower  extremity venous reflux examination done on 05/08/2016 showed no DVT or SVT both lower legs but incompetence of bilateral great saphenous veins was present. A lower arterial study was also done and there was no significant right lower and left lower extremity problems based on a normal toe brachial  index bilaterally and the ABI was 1.21 the left and 1.23 on the right. His post ablation venous duplex examination showed successful ablation of the right GS vein with thrombus formation 2 below the right saphenofemoral junction. The deep system was patent without evidence of thrombosis and this was done on 08/30/2016. 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 Addendum: regarding his x-rays done today and x-ray of the left foot -- IMPRESSION: No objective evidence of osteomyelitis. There are soft tissue changes which may reflect cellulitis. X-ray of the right foot -- IMPRESSION:Findings compatible with cellulitis of the toes. No objective evidence of osteomyelitis is observed. 10/01/16 on evaluation today patient's wounds appeared to be doing some better. I did review the x-rays as well which showed no evidence of osteomyelitis although there was evidence on x-ray of cellulitis. He fortunately is not having any discomfort although he continues to have some swelling. He does not remember being on any antibiotics recently. 10/15/16 on evaluation today patient's wounds overall appear to be doing better although he does have a new location noted on the left foot. Fortunately he is not having significant pain. It almost has the appearance that something is rubbing on the end of his toes but he wears the open toe shoes and according to what he is telling me never wears anything that would rub on his foot. There is no evidence of infection and specifically no evidence of a fungal infection 10/22/16 On evaluation today patient's wounds appeared to be doing better compared to last week in regard to his bilateral lower extremities. Fortunately I happy with how things are progressing although he still has ulcers I feel like that he is improving and appropriate manner. MATTHEWS, FRANKS  ([id number]) 11/12/16 on evaluation today patient appears to be doing well in regard to his bilateral feet and the respective wounds. We have been using surrounding her dressings along with an antifungal cream which seems to be doing very well. He has no bilateral dysfunction noticed that the rituals are weight loss at this point. He also has no nausea or vomiting a note purulent discharge. He did see Vein and vascular today and he tells me that they told him he could have surgery for his venous stasis but they did not feel like it was worth it in his words. Fortunately patient's wounds do appear to be getting sneakily better. 11/26/2016 -- he says he is going to have some surgery during this week at [facility] for possibly a colon resection. 12/31/2016 -- the patient has been noncompliant with his smoking and I'm not sure whether he is also started drinking again. He continues to be very nonchalant about his care 01/14/2017 -- the patient's HandP has been reviewed well and I understand he is being compliant with trying to give up smoking and his local dressing changes. He does not have any surgical options of 4 to him by his vascular surgeons.he was last seen in early August by Dr. Hortencia Pilar who recommended compression stockings,and possibly lymph pumps in 2-3 months after doing a review ultrasound. 01/28/2017 - the patient  did not have any fresh complaints but on examination I noted a large lacerated wound on the plantar aspect of his right fourth toe which had a lot of necrotic debris and it probes down to bone. 02/07/2017 -- x-ray of the right foot -- IMPRESSION: Soft tissue swelling about the first through fourth toes consistent with cellulitis. New destructive change in the tuft of the distal phalanx of the great toe is consistent with osteomyelitis. 02/14/2017 -- the patient's MRI is pending this coming Monday and he still continues to smoke. We have again gone over off loading of his  wounds in great detail and he says he's been compliant. 02/21/2017 -- MR of the right foot -- IMPRESSION: 1. Soft tissue ulcer at the tip of the first, second and third toe knows. Cortical irregularity and bone marrow edema in the first distal phalanx most concerning for osteomyelitis. Mild marrow edema in the second and third distal phalanx without definite cortical destruction which may reflect early osteomyelitis versus reactive marrow edema. 2. Soft tissue edema surrounding the first phalanx most consistent with cellulitis. the patient was also recently evaluated by his medical oncologist Dr. 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 She is treating him with weekly Procrit. She is also keeping intra-abdominal lymphadenopathy and right lower lobe lung nodule under observation. 04/04/2017 -- he was seen by Dr. Adrian Prows on 03/25/2017 -- after review he empirically put him on ciprofloxacin and doxycycline as they have good bone penetration and good bioavailability and it will cover the usual pathogens and diabetic foot osteomyelitis. He will check inflammatory markers and plan a 32-38 week old records. C-reactive protein was 0.3 and the ESR was 72 04/18/17 on evaluation today patient appears to be doing about the same in regard to his lower extremity wounds bilaterally. He has continued to use the antifungal cream which does seem to be beneficial. Nonetheless the ulcers do seem to in some areas be epithelial eyes over and in other areas are still open. He is having no significant discomfort. 04/25/17-he is here in follow-up evaluation for multiple ulcerations to multiple toes bilaterally. He states he did see Dr. Ola Spurr again last week and continues antibiotic therapy. He is voicing no complaints or concerns, will continue with current treatment plan will possibility of adding compression therapy next week after an  additional week of treatment/lotions to BLE prescribed by Dr Ola Spurr 05/02/17 he is here in follow up for for multiple ulcers to multiple toes bilaterally. we will stop using antifungal cream and will continue with silvercel and follow up next week 05/09/17-he is here in follow-up for multiple ulcerations to multiple toes bilaterally. There is improvement in appearance. He has not completely stopped using antifungal cream, but admits he has not using it between the toes. He has an appointment with Dr. Ola Spurr on 2/11, continues on doxycycline and Cipro. It has been 5 weeks of antibiotic therapy, we will order plain film xray to evaluate for osteomyelitis next week, prior to follow up with ID. Will continue with silvercel and follow up next week 05/16/17-he is here in follow-up evaluation for multiple ulcerations to multiple toes bilaterally and new wound to the right posterior heel. There is essentially no change in appearance, deteriorating measurements; he has a history of waxing and waning measurements. He admits that he continues to apply moisturizer/cream/ointment to his toes despite weekly reminders to only apply silvercel to his toes. He states that he  thinks the surgical shoe contributed to the superficial ulcer to his posterior heel, he is unable to articulate if this was an area of dry cracked skin as he has a similar area to the left heel. He now is wearing open toed slippers. He has an appointment with Dr. Ola Spurr on 2/11. We have ordered x-rays for her bilateral feet; Jedlicka, Aarit E. ([id number]) he was advised to obtain the x-rays today or tomorrow. He will follow-up next week 05/23/17-he is here in follow-up evaluation for multiple ulcerations to multiple toes bilaterally and the right posterior heel. There is improvement in maceration. He has been compliant and not applying any moisturizing agent to his toes. He has been using Lac-Hydrin for his lower extremities with  improvement. He did not go to his appointment on Monday with Dr. Ola Spurr secondary to financial concerns. X-rays for her bilateral feet showed: LEFT FOOT with slight erosion of the tuft of the distal phalanges of the left first and second toe suspicious for osteomyelitis, RIGHT FOOT with 1.erosion of the tufts of the distal phalanges of the right first second and possibly third toes consistent with osteomyelitis, 2 no definitive abnormality of the calcaneus is seen on the images obtained, 3. Plantar calcaneal degenerative spur. We briefly discussed hyperbaric adjunctive therapy for treatment of chronic refractory osteomyelitis. I do not find an a1c in EMR, will contact PCP for record, or order if needed. He has been encouraged to contact Dr Ola Spurr office regarding the follow-up appointment, encouraged him to inquire about payment plan. We will continue with same treatment plan and follow-up next week. He states he is still taking antibiotics and has "a lot" left. He states he has been taking them as directed, 2 pills twice daily. According to Dr. Blane Ohara office notes he was originally started on 12/17 for 4 weeks and extended on 1/14 for an additional 4 weeks. He should be done with his antibiotic therapy, he was advised to bring his bottles and to his next appointment, we will contact pharmacy. 05/30/17-he is here in follow-up evaluation for multiple ulcerations to multiple toes bilaterally and the right posterior heel. He is accompanied by his brother-in-law. Wounds are stable. He has yet to make up with Dr. Ola Spurr. We contacted his PCP, with no record of recent A1c we will draw an A1c. His brother-in-law states that he was taken off all of his diabetic medication secondary to kidney function. 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 The antibiotics were counted and have approximately  2 weeks left, although they should be complete. The pill organizer reveals missing days. We discussed the need for consistent medications, to have optimal benefit of medication. He has a cousin that lives with him and he will ask her to check his organizer daily. He has been advised to follow up with Dr Ola Spurr, and will go by the office today. He has been advised to quit smoking. 06/06/17-he is here in follow up evaluation. He has had to make an appointment with Dr. Ola Spurr. He did have blood work obtained, a1c 5. He continues to take antibiotic therapy. Significant improvement in bilateral lower extremity edema with compression therapy. Essentially no change in ulcerations to toes. He states he is "going to try something different" and "let me know next week" if it works; he would not provide any additional information and was encouraged to follow our orders. We will follow up next week 06/13/17-he is  here in follow-up evaluation. He has an appointment with Dr. Ola Spurr tomorrow morning. He states he purchased an ointment from [facility] and applied to his toes for 3 days, he does not remember the name of the ointment. There is improvement to his wounds, minimal maceration. He continues to take antibiotic therapy, this should have been completed last month. His brother-in-law who regularly accompanies his appointments was asked to take the bottles to the appointment tomorrow with Dr. Ola Spurr so he is aware. We will continue with 3 layer compression, and order OPEN TOE compression 20-30mmHg; we will apply compression stockings next week. He continues to smoke, smoked "2 cigarettes" last week 06/20/17 on evaluation today patient did receive his compression stockings which he has with him today for both lower extremities. With that being said he tells me at this point in time that he is very happy to have these he really is not a big fan of the compression wraps that we have been utilizing  although they have been of great benefit for him. Nonetheless at this point he does want to switch to the compression stockings. In my opinion as long as he is continuing with compression I'm okay with the stockings or the wraps. 06/27/17-he is here in follow-up evaluation for multiple ulcerations to his bilateral toes. There is some improvement in appearance. He is compliant in wearing his compression stockings with significant improvement in lower extremity edema. He saw Dr Ola Spurr on 3/8, per his notes they would redraw ESR and CRP; plan to continue antibiotic therapy if these remain elevated. I do not see an ESR or CRP level in Epic. The patient continues to take antibiotics. 07/11/17-He is here in follow-up evaluation for multiple ulcerations to multiple toes bilaterally. He presents with complete epithelialization to the right third toe; there has been no deterioration. He continues on antibiotic therapy. He will follow-up next week 07/18/17-He is here in follow-up evaluation for multiple ulcerations to multiple toes bilaterally. He continues to make improvement. He continues on antibiotic therapy. He states he has been using something additional to our orders, he does not elaborate but states he will bring it in next week. 07/25/17-He is here in follow up evaluation for multiple ulcerations to bilateral toes. He is stable. He has completed antibiotic therapy. He admits to "filing" his toes after showers each evening, this is what he was referencing last week; he does not filing for the wounds. We will switch to [facility] and monitor for any improvement, he will follow-up next week 08/01/17-He is here in follow-up evaluation. He admits to "picking" at his toes after cleansing yesterday, leading to new areas of tissue loss on the bilateral second toe. There is improvement noted to the bilateral great toe. We will dress toes today and hope that they maintain until Monday where he will come in  for a nurse visit. He has been advised, multiple times with expressed verbalization, to change the dressings to silvercel if the dressings get wet prior to [facility] appointment. 08/08/17-He is here in follow-up evaluation for bilateral first and second toe ulcerations. There is significant improvement to all ulcerations since last visit. We will switch to [facility] to all wounds and he will continue with nurse visits on a Guiney, Vinny E. ([id number]) Monday/Thursday schedule and follow-up with me in 2 weeks. He continues to smoke, 1-3 cigarettes per day, and has been encouraged to not smoke until his next follow-up in 2 weeks. 08/15/17 on evaluation today patient's ulcers on his toes  actually appear to be doing fairly well the right to ulcers may be a little bit more moist compared to the left I'm not really sure exactly why as the openings appear to be very small and I'm not seeing any evidence of anything significant as far as you lightest or otherwise. Nonetheless this may just be a small setback he's been doing very well with the [facility] Dressing 08/22/17-He is here in follow-up evaluation for ulcerations to his bilateral first and second toes, there is small/scant amount of drainage noted on today's dressing. He continues with Hydrofera Blue. He continues to smoke, 1 cigarette a day. Voices no complaint or concerns, compliant and compression therapy today. He'll follow-up next week 08/29/17-He is here in follow-up evaluation for ulcerations to his bilateral first and second toes, with a new wound to the left third toe. He states he cut himself while cutting the toenail. He presents today with more maceration and increased measurements; waxing and waning measurements and moisture has been an ongoing issue. He continues to smoke a proximally 1 cigarette per day, but states he has not smoked since Sunday. We will initiate medihoney daily and evaluate next week. He presents today without  compression stockings 09/05/17-He is here in follow-up evaluation for ulcerations to bilateral first and second and left third toe. He did not pick up the medihoney and therefore has not been changing his dressing. He admits to inconsistent where of compression stockings, admits to pain to his bilateral lower extremities with unilateral edema (right greater than left). The ulcerations to multiple toes are more macerated and larger in size than last week. He is wearing compression therapy today. He admits to an overall feeling of weakness and fatigue and has been encouraged to contact his PCP for evaluation; he has a known history of anemia and intraabdominal lymphadenopathy. We will return to silvercel and he will follow up next week 09/19/17-He is here in follow-up evaluation for ulcerations to bilateral first and second toe.he was unable to make last week's appointment. The culture that was obtained on 5/30 grew clindamycin sensitive MRSA; he was initiated on 6/4 but did not start it until 6/7. There is significant improvement in all wounds, healing to left second and third toe. He also admits to having no alcohol or smoking for the last 6 days. We will continue with same treatment plan I will extend the clindamycin for an additional 10 days and he will follow-up next week. 09/26/17- He is here in follow evaluation for multiple ulcerations to multiple toes bilaterally. He continues to be non-compliant with dressing, compression therapy. He states he is taking the antibiotics as prescribed, although his recall is inconsistent. There is noted deterioration in all ulcers. We will continue same treatment plan, antibiotic therapy and he will follow up next week. 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 This information is obtained  from the medical record, as he cannot articulate specifics to his diagnosis or treatment plan. 10/17/17-He is here in follow-up evaluation for multiple ulcerations to multiple toes bilaterally. There is chronic waxing and waning improvement/deterioration. On today's exam they appear improved, dry without maceration. He continues to apply a variety of topical agents, stating that he has switched between the blue product and silver product. In my opinion, this will be a chronic waxing and waning giving  his intermittent compliance. He will follow-up in 2 weeks. As it relates to the biopsy taken from his buttock, he has not followed up with that and has not started radiation therapy. 10/31/17-He is here for evaluation for multiple ulcerations to multiple toes bilaterally. Continues with waxing and waning improvement versus deterioration. They are stable on today's exam without significant maceration. 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 We will continue with every 2 week follow-up appointments 11/14/17- He is seen in follow-up evaluation for multiple ulcerations to multiple bilateral toes. He is scheduled for excision of basal cell carcinoma to his right buttock on Monday 8/12. He continues to have chronic waxing and waning of his ulcers; today is a stable day with minimal drainage and left ear is better than right toes. I will recount to infectious disease regarding chronic suppressive therapy. He admits to being more compliant with compression stockings, although he is not wearing any today. He has been encouraged to continue with dressing changes as ordered and compression therapy daily. He will be seen in 2 weeks 11/28/17-He is seen in follow up evaluation for multiple ulcerations to multiple toes bilaterally. He had an overnight admission at [facility] (8/11-8/12) for symptomatic anemia, received two units prbc. His surgical date has been moved to 9/94 excision  of basal cell carcinoma to the right buttock. He has been noncompliant in wearing compression stockings, applying topical treatment as ordered; he has been applying a "cream", Hydrofera Blue, silvercel. He is currently on no antibiotic therapy; we will send for re-referral to ID for suppressive therapy given his multiple co-morbidities, poor surgical candidate, unwilling to have toe amputation and general non-adherence. 12/19/17 on evaluation today patient actually appears to be doing very well in regard to his toes at least compared to last time I saw him. He again has been coming to our office for quite a bit of time. 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 He states is hurting a little bit but doing well in general. I'm see were not taking care of this area dermatology is. He does have his appointment with infectious disease upcoming which they re-consult for suppressive therapy that's next Monday. 01/02/18 on evaluation today patient actually appears to be doing rather well in regard to his toe ulcers although he tells me he Welge, Jakevion E. ([id number]) has been put in an ointment on this which I am concerned may be causing some additional moisture buildup which we really do not want. He supposed be using silver cell which is mainly thing I want him to be utilizing. Fortunately there does not appear to be any evidence of worsening infection which is good news. He has had surgery on his gluteal region where he had an excision of a cancerous area fortunately that seems to be doing well although he is having some discomfort. 02/06/18 on evaluation today patient actually appears to be doing well at all locations except for his second toe use bilaterally where the distal tip is still wrong open. He did state that he can't stand for dry skin to buildup on his toes subsequently as a question and  further it sounds as if he actually pills this all which is likely keeping the toe is wrong and open. Also think that's what happened in the bottom of his foot although that appears to have healed at this point.  There does not appear to be any evidence of infection. 02/20/18 on evaluation today patient appears to be doing better in regard to his foot ulcers. Everything has shown signs of improvement which is excellent news. Overall I'm very pleased with how things appear currently. The patient states he's not picking her point at any skin and that he wears shoes at all times when he is ambulating at home. Both are things that I've advocated and recommended for him in the past. 03/13/18 on evaluation today patient actually appears to be doing very well in regard to the bilateral toes in the overall appearance of the wound bed locations. Fortunately there does not appear to be any signs of infection at this time. He states that he is very pleased with how things seem to be progressing to be honest as am I. 03/27/1899 evaluation today patient appears to be doing well in regard to his toe ulcers. He just has a few areas remaining and these appear to be showing signs of improvement as well which is great news. Overall I have been impressed with the progress of the past several weeks. 04/10/18 on evaluation today patient appears to be doing a little bit worse in regard to the right plantar foot at the regional the first metatarsal as well as the left second toe. He tells me that he ran out of bandages he also tells me he's been wearing his flip-flops a lot at home as well as a new pair of boots that he received as well. This is as opposed to the postop shoes that we had previously given him. Nonetheless overall I feel like that he has had a little bit more friction to the area which is causing things to look a little bit worse today. 04/24/18 on evaluation today patient's wounds for the most part appear to  be doing excellent especially in regard to the bilateral second toes. With that being said he does have issues with the wound still on the first metatarsal region and first toe of the right foot which have not completely closed at this point. Nonetheless I do feel like that he is making progress which is good news. 05/08/18 on evaluation today patient's wounds appear to be doing just a little bit worse than normal simply due to the fact that he has not had dressing supplies. He apparently owed $35 in order for them to be shipped and unfortunately is not able to pay that currently. We did give him information for the [employer] today which will hopefully help cover his dressing supplies. He was thankful for this he states is gonna contact them this afternoon. Fortunately otherwise or does not hear any signs of infection. 05/22/18 on evaluation today patient appears to be doing somewhat poorly in regard to his toes. Upon further questioning it appears that he actually has been pulling skin off of the toes which "drive him crazy". Subsequently as he does this he calls his new areas to tear open and I think this is one of the reasons why his toes are not healing as appropriately as we would like. He fortunately has no signs of infection at this time. With that being said I'm still concerned about the possibility of a fungal infection even though there doesn't appear to be anything bacterial. Potentially nystatin powder could be of benefit for him as far as trying to help this in that regard. 06/05/18 on evaluation today patient actually appears to be doing very well in  regard to his foot ulcers. Fortunately there's no signs of infection. He's been tolerating the dressing changes without complication. I'm very pleased with how things seem to be progressing. Fortunately there's no evidence of active infection at this point I do believe nystatin powder has been of benefit for him. 06/19/18 on  evaluation today patient actually appears to be doing a little worse compared to last evaluation. Tells me that he has not had any supplies for two weeks. With that being said his plantar foot wounds on the right actually appear to be doing very well. It's actually a second toes in the first toes that are the worst. I think these are areas the was able to get to and pick and he tells me in fact that he destine to pick out some of the dry skin because it causes him annoyance. Nonetheless I think this is somewhat detrimental to him I think it's worth keeping part of the wound open in particular. Fortunately there is no sign of infection at this time although I think that is a [name] which I discussed specifically that he's not careful you can end up reopening this and causing greater problems for himself. TIQUAN, BOUCH ([id number]) 07/03/18 on evaluation today patient's wound bed actually shows evidence of good granulation at this time and epithelialization as far as the wounds in general are concerned. He has been tolerating the dressing changes including the nystatin without complication. This is good news. With that being said he seems to be still having some trouble with the left second toe distally at this point. 4/9; most of the wounds look quite satisfactory. He requires debridement of the plantar right great toe otherwise everything looks superficial to improved. His left second toe is healed. 08/14/18 on evaluation today patient actually appears to be doing very well in regard to his foot ulcerations. In fact he just has three small areas of the right foot still remaining open the left foot is closed and dry at this point. Overall I feel like he is doing quite well which is excellent news. Fortunately there's no signs of active infection. 08/28/18 on evaluation today patient's toes of the right foot actually shows signs of being a little bit worse in regard to the overall open areas at this  point. He does tell me that he "washes them well" when he is taking a shower. Again I explained that I want to make sure he's not washing them to aggressively as this could potentially cause issues with overall worsening. 09/12/18 on evaluation today patient actually appears to be doing well in regard to his right foot ulcers. He's been tolerating the dressing changes without complication. Fortunately there's no signs of active infection at this time. With that being said he does have a new one on the dorsal surface of his foot just proximal to his toes he does not know really where this came from. 09/25/18 on evaluation today patient actually appears to be doing somewhat better in regard to his right foot ulcers although he has a new opening on the left second toe unfortunately. This is something that seems to be back and forth he tells me did [place] scan at this area that is why it's now open yet again. No fevers, chills, nausea, or vomiting noted at this time. 10/16/18 on evaluation today patient actually appears to be doing decently poorly in regard to his ulcers on his feet. He tells me that for the past 3-4 days he is not  having to take therefore is not been putting on any dressings he came in with the socks actually just stuck to the toes with a been draining. He also has not picked up the nystatin powder that was sent to the pharmacy with refills all he has to do is go pick that up he has not done so. He also told me for the more that he feels like his "blood is low" which seems to be referring to his hemoglobin [facility] and subsequently states he needs to go there when he leaves here in order to see about a blood transfusion. Nonetheless I think that's something that he definitely needs to get on top of if he feels like that's necessary. Fortunately there's no signs of active infection at this time. 10/30/18 on evaluation today patient appears to be  doing a little worse in regard to his lower extremity ulcers specifically his toes for the most part although he does have an open wound on the right dorsal foot. He has been picking at his toes which damages the skin and leads to what we are seeing today. Fortunately there is no signs of active infection systemically or locally. With that being said I am concerned about the fact that I'm not sure he's ever really gonna feel as long as he keeps messing with his toes such as picking and bothering them which causes them to reopen on a regular basis. 11/13/18 upon inspection today patient appears to be doing somewhat more poorly currently compared to the last time I saw him. He still has not been wearing his compression stockings even if he did they do not cover the toes which I think may be more of a complication problem for him to be honest. He many compression stockings that do indeed cover his toes but he also needs to have the willpower to actually wear these in a regular basis so that they actually do him any good at all. He also tells me that he is out of dressing supplies and that he needs to go pick some up so that he will have them. Same is true of the powder although he tells me it's at the pharmacy he just has to go pick it up. 11/20/18 on evaluation today patient actually appears to be doing better with regard to his toe ulcers at this point. Fortunately he does seem to be perform the dressing changes appropriately and this has shown signs of improvement as best I can tell today. He did tell me that he attempted to use his compression stocking for about 15 minutes this morning before coming into the clinic unfortunately he tells me this caused him a lot of pain. I am not sure if there the proper size he was unable to get the new ones that we had ordered for some reason they did not get sent I am assuming this has something to do with insurance. Patient History Information obtained from  Patient. Social History Current every day smoker, Marital Status - Widowed, Alcohol Use - Daily - quit drinking about a week ago, Drug Use - No History, Caffeine Use - Moderate. ZAYVIEN, CANNING ([id number]) Medical History Eyes Denies history of Cataracts, Glaucoma, Optic Neuritis Ear/Nose/Mouth/Throat Denies history of Chronic sinus problems/congestion, Middle ear problems Hematologic/Lymphatic Patient has history of Anemia, Lymphedema Denies history of Hemophilia, Human Immunodeficiency Virus, Sickle Cell Disease Respiratory Denies history of Aspiration, Asthma, Chronic Obstructive Pulmonary Disease (COPD), Pneumothorax,  Sleep Apnea, Tuberculosis Cardiovascular Patient has history of Congestive Heart Failure, Hypertension, Peripheral Venous Disease Denies history of Angina, Arrhythmia, Coronary Artery Disease, Deep Vein Thrombosis, Hypotension, Myocardial Infarction, Peripheral Arterial Disease, Phlebitis, Vasculitis Gastrointestinal Denies history of Cirrhosis , Colitis, Crohn s, Hepatitis A, Hepatitis B, Hepatitis C Endocrine Patient has history of Type II Diabetes Genitourinary Denies history of End Stage Renal Disease Immunological Denies history of Lupus Erythematosus, Raynaud s, Scleroderma Integumentary (Skin) Denies history of History of Burn, History of pressure wounds Musculoskeletal Denies history of Gout, Rheumatoid Arthritis, Osteoarthritis, Osteomyelitis Neurologic Patient has history of Neuropathy Denies history of Dementia, Quadriplegia, Paraplegia, Seizure Disorder Medical And Surgical History Notes 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 Respiratory Denies complaints or symptoms of Chronic or frequent coughs, Shortness of Breath. Cardiovascular Complains or has symptoms of LE  edema. Denies complaints or symptoms of Chest pain. Psychiatric Denies complaints or symptoms of Anxiety, Claustrophobia. Objective Constitutional Well-nourished and well-hydrated in no acute distress. KHAIDEN, SEGRETO ([id number]) Vitals Time Taken: 9:35 AM, Height: 69 in, Weight: 168 lbs, BMI: 24.8, Temperature: 98.6 F, Pulse: 65 bpm, Respiratory Rate: 16 breaths/min, Blood Pressure: 143/65 mmHg. Respiratory normal breathing without difficulty. clear to auscultation bilaterally. Cardiovascular regular rate and rhythm with normal S1, S2. Psychiatric this patient is able to make decisions and demonstrates good insight into disease process. Alert and Oriented x 3. pleasant and cooperative. General Notes: Upon inspection patient's toes actually appear to be doing better today than they were last week which is good news. I think that weekly follow-up appointments might be better for him as opposed to waiting 2 weeks in between. I am able to keep a much better eye on him which is 1 good part of this. Unfortunately he could not wear the compression stockings. Integumentary (Hair, Skin) Wound #16 status is Open. Original cause of wound was Not Known. The wound is located on the [employer]. The wound measures 0.7cm length x 1cm width x 0.1cm depth; 0.55cm^2 area and 0.055cm^3 volume. There is Fat Layer (Subcutaneous Tissue) Exposed exposed. There is no tunneling or undermining noted. There is a large amount of serous drainage noted. The wound margin is flat and intact. There is large (67-100%) pink granulation within the wound bed. There is no necrotic tissue within the wound bed. Wound #18 status is Open. Original cause of wound was Gradually Appeared. The wound is located on the Right,Dorsal [employer] The wound measures 0.1cm length x 0.1cm width x 0.1cm depth; 0.008cm^2 area and 0.001cm^3 volume. There is Fat Layer (Subcutaneous Tissue) Exposed exposed. There is no tunneling or  undermining noted. There is a large amount of serous drainage noted. The wound margin is flat and intact. There is large (67-100%) pale granulation within the wound bed. There is no necrotic tissue within the wound bed. Wound #2 status is Open. Original cause of wound was Gradually Appeared. The wound is located on the Right Toe Second. The wound measures 0.7cm length x 0.3cm width x 0.1cm depth; 0.165cm^2 area and 0.016cm^3 volume. There is Fat Layer (Subcutaneous Tissue) Exposed exposed. There is no tunneling or undermining noted. There is a large amount of serous drainage noted. The wound margin is flat and intact. There is large (67-100%) pink granulation within the wound bed. There is no necrotic tissue within the wound bed. Wound #20 status is Open. Original  cause of wound was Pressure Injury. The wound is located on the Left Toe Second. The wound measures 0.5cm length x 0.8cm width x 0.1cm depth; 0.314cm^2 area and 0.031cm^3 volume. There is Fat Layer (Subcutaneous Tissue) Exposed exposed. There is no tunneling or undermining noted. There is a medium amount of serous drainage noted. The wound margin is flat and intact. There is large (67-100%) pink granulation within the wound bed. There is no necrotic tissue within the wound bed. Wound #21 status is Open. Original cause of wound was Shear/Friction. The wound is located on the Left Toe Great. The wound measures 0.7cm length x 0.7cm width x 0.1cm depth; 0.385cm^2 area and 0.038cm^3 volume. The wound is limited to skin breakdown. There is no tunneling or undermining noted. There is a small amount of serous drainage noted. The wound margin is flat and intact. There is large (67-100%) pink granulation within the wound bed. There is no necrotic tissue within the wound bed. Wound #22 status is Open. Original cause of wound was Gradually Appeared. The wound is located on the Right,Distal,Dorsal Foot. The wound measures 5.5cm length x 7cm width x  0.1cm depth; 30.238cm^2 area and 3.024cm^3 volume. The wound is limited to skin breakdown. There is no tunneling or undermining noted. There is a large amount of serous drainage noted. The wound margin is flat and intact. There is large (67-100%) pink granulation within the wound bed. There is no necrotic tissue within the wound bed. 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 Anesthetic (add to Medication List): Wound #16 Right,Plantar Toe Great: Topical Lidocaine 4% cream applied to wound bed prior to debridement (In Clinic Only). Wound #18 Right,Dorsal Toe Great: Topical Lidocaine 4% cream applied to wound bed prior to debridement (In Clinic Only). Wound #2 Right Toe Second: Topical Lidocaine 4% cream applied to wound bed prior to debridement (In Clinic Only). Wound #20 Left Toe Second: Topical Lidocaine 4% cream applied to wound bed prior to debridement (In Clinic Only). Wound #21 Left Toe Great: Topical Lidocaine 4% cream applied to wound bed prior to debridement (In Clinic Only). Wound #22 Right,Distal,Dorsal Foot: Topical Lidocaine 4% cream applied to wound bed prior to debridement (In Clinic Only). Skin Barriers/Peri-Wound Care: Wound #16 Right,Plantar Toe Great: Antifungal powder-Nystatin Wound #18 Right,Dorsal Toe Great: Antifungal powder-Nystatin Wound #2  Right Toe Second: Antifungal powder-Nystatin Wound #20 Left Toe Second: Antifungal powder-Nystatin Wound #21 Left Toe Great: Antifungal powder-Nystatin Wound #22 Right,Distal,Dorsal Foot: Antifungal powder-Nystatin Primary Wound Dressing: Wound #16 Right,Plantar Toe Great: Silver Alginate - secure with tape Wound #18 Right,Dorsal Toe Great: Silver Alginate - secure with tape Wound #2 Right Toe Second: Silver Alginate - secure with tape Wound #20 Left Toe SecondTAISON, CELANI ([id number]) Silver Alginate - secure with tape Wound #21 Left Toe Great: Silver Alginate - secure with tape Wound #22 Right,Distal,Dorsal Foot: Silver Alginate - secure with tape Dressing Change Frequency: Wound #16 Right,Plantar Toe Great: Change dressing every other day. Wound #18 Right,Dorsal Toe Great: Change dressing every other day.  Wound #2 Right Toe Second: Change dressing every other day. Wound #20 Left Toe Second: Change dressing every other day. Wound #21 Left Toe Great: Change dressing every other day. Wound #22 Right,Distal,Dorsal Foot: Change dressing every other day. Follow-up Appointments: Wound #16 Right,Plantar Toe Great: Return Appointment in 1 week. Wound #18 Right,Dorsal Toe Great: Return Appointment in 1 week. Wound #2 Right Toe Second: Return Appointment in 1 week. Wound #20 Left Toe Second: Return Appointment in 1 week. Wound #21 Left Toe Great: Return Appointment in 1 week. Wound #22 Right,Distal,Dorsal Foot: Return Appointment in 1 week. Edema Control: Wound #16 Right,Plantar Toe Great: Other: - double layer tubi grip Wound #18 Right,Dorsal Toe Great: Other: - double layer tubi grip Wound #2 Right Toe Second: Other: - double layer tubi grip Wound #20 Left Toe Second: Other: - double layer tubi grip Wound #21 Left Toe Great: Other: - double layer tubi grip Wound #22 Right,Distal,Dorsal Foot: Other: - double layer tubi grip Additional Orders /  Instructions: Wound #16 Right,Plantar Toe Great: Stop Smoking Increase protein intake. Wound #18 Right,Dorsal Toe Great: Stop Smoking Increase protein intake. Wound #2 Right Toe Second: Stop Smoking Increase protein intake. Wound #20 Left Toe Second: Stop Smoking Increase protein intake. Wound #21 Left Toe Great: Stop Smoking Increase protein intake. AGUSTUS, MANE ([id number]) Wound #22 Right,Distal,Dorsal Foot: Stop Smoking Increase protein intake. Medications-please add to medication list.: Wound #16 Right,Plantar Toe Great: Other: - Nystatin Powder Wound #18 Right,Dorsal Toe Great: Other: - Nystatin Powder Wound #2 Right Toe Second: Other: - Nystatin Powder Wound #20 Left Toe Second: Other: - Nystatin Powder Wound #21 Left Toe Great: Other: - Nystatin Powder Wound #22 Right,Distal,Dorsal Foot: Other: - Nystatin Powder Consults ordered were: Vascular - ABI TBI bilateral 1. I would recommend that we continue with the nystatin powder as this seems to be beneficial for the patient and keeping things drying under control. 2. I also recommend that he continue with the silver alginate dressings when he uses this it seems to be beneficial for him which is also good news. 3. With regard to the compression he cannot wear the compression stockings at this point for that reason I am in a recommend that we go ahead and place an order for an arterial study with ABI and TBI to be performed. He is in agreement with that plan. Subsequently we will also have him utilize Tubigrip for the time being since this will provide some compression without causing additional complication. Such as discomfort. We will see patient back for reevaluation in 1 week here in the clinic. If anything worsens or changes patient will contact our office for additional recommendations. Electronic Signature(s) Signed: 11/23/2018 11:39:45 PM By: Worthy Keeler PA-C Previous Signature: 11/20/2018 10:38:20 AM  Version By: Worthy Keeler PA-C Entered By: Worthy Keeler on 11/23/2018 23:32:56 Hasz, [name] ([id number]) -------------------------------------------------------------------------------- ROS/PFSH Details Patient Name: [name], [name] Date of Service: 11/20/2018 9:30 AM Medical Record Number: [id number] Patient Account Number: [id number] Date of Birth/Sex: 1950/11/12 (68 y.o. M) Treating RN: Army Melia Primary Care Provider: Lamonte Sakai Other Clinician: Referring Provider: Lamonte Sakai Treating Provider/Extender: Melburn Hake, Haneef Hallquist Weeks in Treatment: 112 Information Obtained From Patient Constitutional Symptoms (General Health) Complaints and Symptoms: Negative for: Fatigue; Fever; Chills; Marked Weight Change Respiratory Complaints and Symptoms: Negative for: Chronic or frequent coughs; Shortness of Breath Medical History: Negative for: Aspiration; Asthma; Chronic Obstructive Pulmonary Disease (COPD); Pneumothorax; Sleep Apnea; Tuberculosis Cardiovascular Complaints and Symptoms: Positive for: LE edema Negative  for: Chest pain Medical History: Positive for: Congestive Heart Failure; Hypertension; Peripheral Venous Disease Negative for: Angina; Arrhythmia; Coronary Artery Disease; Deep Vein Thrombosis; Hypotension; Myocardial Infarction; Peripheral Arterial Disease; Phlebitis; Vasculitis Psychiatric Complaints and Symptoms: Negative for: Anxiety; Claustrophobia Eyes Medical History: Negative for: Cataracts; Glaucoma; Optic Neuritis Ear/Nose/Mouth/Throat Medical History: Negative for: Chronic sinus problems/congestion; Middle ear problems Hematologic/Lymphatic Medical History: Positive for: Anemia; Lymphedema Negative for: Hemophilia; Human Immunodeficiency Virus; Sickle Cell Disease Gastrointestinal Kalter, [name] ([id number]) Medical History: Negative for: Cirrhosis ; Colitis; Crohnos; Hepatitis A; Hepatitis B; Hepatitis C Endocrine Medical History: Positive  for: Type II Diabetes Treated with: Oral agents Blood sugar tested every day: Yes Tested : QD Genitourinary Medical History: Negative for: End Stage Renal Disease Immunological Medical History: Negative for: Lupus Erythematosus; Raynaudos; Scleroderma Integumentary (Skin) Medical History: Negative for: History of Burn; History of pressure wounds Musculoskeletal Medical History: Negative for: Gout; Rheumatoid Arthritis; Osteoarthritis; Osteomyelitis Neurologic Medical History: Positive for: Neuropathy Negative for: Dementia; Quadriplegia; Paraplegia; Seizure Disorder Oncologic Medical History: Past Medical History Notes: 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 ARTHUR, SPEAGLE ([id number]) Electronic Signature(s) Signed: 11/20/2018 11:56:24 AM By: Army Melia Signed: 11/20/2018 10:42:11 PM By: Worthy Keeler PA-C Entered By: Worthy Keeler on 11/20/2018 10:16:17 Swango, [name] ([id number]) -------------------------------------------------------------------------------- SuperBill Details Patient Name: [name], [name] Date of Service: 11/20/2018 Medical Record Number: [id number] Patient Account Number: [id number] Date of Birth/Sex: 1950/09/21 (68 y.o. 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 I89.0 Lymphedema, not elsewhere classified F17.218 Nicotine dependence, cigarettes, 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 abuse 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, right ankle and foot Facility Procedures CPT4 Code: 27517001 Description: 74944 - WOUND CARE VISIT-LEV 5 EST PT Modifier: Quantity: 1 Physician Procedures CPT4 Code Description: [id number] - WC PHYS LEVEL 4 - EST PT ICD-10 Diagnosis Description L97.521 Non-pressure chronic ulcer of other part of left foot limited to L97.511 Non-pressure chronic ulcer of other part of right foot limited t E11.621 Type 2  diabetes mellitus with foot ulcer I87.323 Chronic venous hypertension (idiopathic) with inflammation of bi Modifier: breakdown of s o breakdown of lateral lower e Quantity: 1 kin skin xtremity Electronic Signature(s) Signed: 11/21/2018 1:32:40 PM By: Sharon Mt Signed: 11/22/2018 2:28:02 AM By: Worthy Keeler PA-C Previous Signature: 11/20/2018 10:38:37 AM Version By: Worthy Keeler PA-C Entered By: Sharon Mt on 11/21/2018 13:32:39

## 2018-11-21 ENCOUNTER — Inpatient Hospital Stay: Payer: Medicare Other

## 2018-11-21 ENCOUNTER — Other Ambulatory Visit: Payer: Self-pay

## 2018-11-21 DIAGNOSIS — I130 Hypertensive heart and chronic kidney disease with heart failure and stage 1 through stage 4 chronic kidney disease, or unspecified chronic kidney disease: Secondary | ICD-10-CM | POA: Diagnosis not present

## 2018-11-21 DIAGNOSIS — D649 Anemia, unspecified: Secondary | ICD-10-CM

## 2018-11-21 MED ORDER — SODIUM CHLORIDE 0.9% IV SOLUTION
250.0000 mL | Freq: Once | INTRAVENOUS | Status: AC
  Administered 2018-11-21: 250 mL via INTRAVENOUS
  Filled 2018-11-21: qty 250

## 2018-11-21 MED ORDER — ACETAMINOPHEN 325 MG PO TABS
650.0000 mg | ORAL_TABLET | Freq: Once | ORAL | Status: AC
  Administered 2018-11-21: 650 mg via ORAL
  Filled 2018-11-21: qty 2

## 2018-11-22 LAB — TYPE AND SCREEN
ABO/RH(D): A POS
Antibody Screen: NEGATIVE
Unit division: 0

## 2018-11-22 LAB — BPAM RBC
Blood Product Expiration Date: 202008292359
ISSUE DATE / TIME: 202008140949
Unit Type and Rh: 6200

## 2018-11-25 ENCOUNTER — Other Ambulatory Visit (INDEPENDENT_AMBULATORY_CARE_PROVIDER_SITE_OTHER): Payer: Self-pay | Admitting: Physician Assistant

## 2018-11-25 DIAGNOSIS — S81809S Unspecified open wound, unspecified lower leg, sequela: Secondary | ICD-10-CM

## 2018-11-25 DIAGNOSIS — L97929 Non-pressure chronic ulcer of unspecified part of left lower leg with unspecified severity: Secondary | ICD-10-CM

## 2018-11-25 DIAGNOSIS — M7989 Other specified soft tissue disorders: Secondary | ICD-10-CM

## 2018-11-25 DIAGNOSIS — L97919 Non-pressure chronic ulcer of unspecified part of right lower leg with unspecified severity: Secondary | ICD-10-CM

## 2018-11-27 ENCOUNTER — Encounter: Payer: Medicare Other | Admitting: Physician Assistant

## 2018-11-27 ENCOUNTER — Ambulatory Visit (INDEPENDENT_AMBULATORY_CARE_PROVIDER_SITE_OTHER): Payer: Medicare Other

## 2018-11-27 ENCOUNTER — Other Ambulatory Visit: Payer: Self-pay

## 2018-11-27 DIAGNOSIS — E11621 Type 2 diabetes mellitus with foot ulcer: Secondary | ICD-10-CM | POA: Diagnosis not present

## 2018-11-27 NOTE — Progress Notes (Signed)
[name], [name] ([id number]) Visit Report for 11/27/2018 Arrival Information Details Patient Name: [name], [name] Date of Service: 11/27/2018 9:30 AM Medical Record Number: [id number] Patient Account Number: [id number] Date of Birth/Sex: January 21, 1951 (68 y.o. M) Treating RN: Montey Hora Primary Care Koltyn Kelsay: Lamonte Sakai Other Clinician: Referring Archimedes Harold: Lamonte Sakai Treating Arieona Swaggerty/Extender: Melburn Hake, HOYT Weeks in Treatment: 61 Visit Information History Since Last Visit Added or deleted any medications: No Patient Arrived: Ambulatory Any new allergies or adverse reactions: No Arrival Time: 09:42 Had a fall or experienced change in No Accompanied By: self activities of daily living that may affect Transfer Assistance: None risk of falls: Patient Identification Verified: Yes Signs or symptoms of abuse/neglect since last visito No Secondary Verification Process Completed: Yes Hospitalized since last visit: No Patient Requires Transmission-Based No Implantable device outside of the clinic excluding No Precautions: cellular tissue based products placed in the center Patient Has Alerts: Yes since last visit: Patient Alerts: DMII Has Dressing in Place as Prescribed: Yes Has Compression in Place as Prescribed: No Pain Present Now: No Electronic Signature(s) Signed: 11/27/2018 4:23:33 PM By: Montey Hora Entered By: Montey Hora on 11/27/2018 09:43:31 Musleh, [name] ([id number]) -------------------------------------------------------------------------------- Clinic Level of Care Assessment Details Patient Name: [name], [name] Date of Service: 11/27/2018 9:30 AM Medical Record Number: [id number] Patient Account Number: [id number] Date of Birth/Sex: [birth year] (68 y.o. M) Treating RN: Army Melia Primary Care Erla Bacchi: Lamonte Sakai Other Clinician: Referring Andrei Mccook: Lamonte Sakai Treating Jasmyne Lodato/Extender: Melburn Hake, HOYT Weeks in Treatment: 113 Clinic Level of Care  Assessment Items TOOL 4 Quantity Score []  - Use when only an EandM is performed on FOLLOW-UP visit 0 ASSESSMENTS - Nursing Assessment / Reassessment X - Reassessment of Co-morbidities (includes updates in patient status) 1 10 X- 1 5 Reassessment of Adherence to Treatment Plan ASSESSMENTS - Wound and Skin Assessment / Reassessment []  - Simple Wound Assessment / Reassessment - one wound 0 X- 6 5 Complex Wound Assessment / Reassessment - multiple wounds []  - 0 Dermatologic / Skin Assessment (not related to wound area) ASSESSMENTS - Focused Assessment []  - Circumferential Edema Measurements - multi extremities 0 []  - 0 Nutritional Assessment / Counseling / Intervention []  - 0 Lower Extremity Assessment (monofilament, tuning fork, pulses) []  - 0 Peripheral Arterial Disease Assessment (using hand held doppler) ASSESSMENTS - Ostomy and/or Continence Assessment and Care []  - Incontinence Assessment and Management 0 []  - 0 Ostomy Care Assessment and Management (repouching, etc.) PROCESS - Coordination of Care X - Simple Patient / Family Education for ongoing care 1 15 []  - 0 Complex (extensive) Patient / Family Education for ongoing care []  - 0 Staff obtains Programmer, systems, Records, Test Results / Process Orders []  - 0 Staff telephones HHA, Nursing Homes / Clarify orders / etc []  - 0 Routine Transfer to another Facility (non-emergent condition) []  - 0 Routine Hospital Admission (non-emergent condition) []  - 0 New Admissions / Biomedical engineer / Ordering NPWT, Apligraf, etc. []  - 0 Emergency Hospital Admission (emergent condition) X- 1 10 Simple Discharge Coordination Devan, Green E. ([id number]) []  - 0 Complex (extensive) Discharge Coordination PROCESS - Special Needs []  - Pediatric / Minor Patient Management 0 []  - 0 Isolation Patient Management []  - 0 Hearing / Language / Visual special needs []  - 0 Assessment of Community assistance (transportation, D/C planning,  etc.) []  - 0 Additional assistance / Altered mentation []  - 0 Support Surface(s) Assessment (bed, cushion, seat, etc.) INTERVENTIONS - Wound Cleansing / Measurement []  - Simple Wound Cleansing -  one wound 0 X- 6 5 Complex Wound Cleansing - multiple wounds X- 1 5 Wound Imaging (photographs - any number of wounds) []  - 0 Wound Tracing (instead of photographs) []  - 0 Simple Wound Measurement - one wound X- 6 5 Complex Wound Measurement - multiple wounds INTERVENTIONS - Wound Dressings []  - Small Wound Dressing one or multiple wounds 0 X- 6 15 Medium Wound Dressing one or multiple wounds []  - 0 Large Wound Dressing one or multiple wounds []  - 0 Application of Medications - topical []  - 0 Application of Medications - injection INTERVENTIONS - Miscellaneous []  - External ear exam 0 []  - 0 Specimen Collection (cultures, biopsies, blood, body fluids, etc.) []  - 0 Specimen(s) / Culture(s) sent or taken to Lab for analysis []  - 0 Patient Transfer (multiple staff / Civil Service fast streamer / Similar devices) []  - 0 Simple Staple / Suture removal (25 or less) []  - 0 Complex Staple / Suture removal (26 or more) []  - 0 Hypo / Hyperglycemic Management (close monitor of Blood Glucose) []  - 0 Ankle / Brachial Index (ABI) - do not check if billed separately X- 1 5 Vital Signs [name], [name] ([id number]) Has the patient been seen at the hospital within the last three years: Yes Total Score: 230 Level Of Care: New/Established - Level 5 Electronic Signature(s) Signed: 11/27/2018 11:51:11 AM By: Army Melia Entered By: Army Melia on 11/27/2018 10:10:15 Plaugher, [name] ([id number]) -------------------------------------------------------------------------------- Lower Extremity Assessment Details Patient Name: [name], [name] Date of Service: 11/27/2018 9:30 AM Medical Record Number: [id number] Patient Account Number: [id number] Date of Birth/Sex: 11-02-1950 (68 y.o. M) Treating RN: Montey Hora Primary Care Idamae Coccia: Lamonte Sakai Other Clinician: Referring Deisy Ozbun: Lamonte Sakai Treating Jathan Balling/Extender: Melburn Hake, HOYT Weeks in Treatment: 113 Edema Assessment Assessed: [Left: No] [Right: No] Edema: [Left: Yes] [Right: Yes] Vascular Assessment Pulses: Dorsalis Pedis Palpable: [Left:Yes] [Right:Yes] Electronic Signature(s) Signed: 11/27/2018 4:23:33 PM By: Montey Hora Entered By: Montey Hora on 11/27/2018 09:57:19 [name], [name] ([id number]) -------------------------------------------------------------------------------- Multi Wound Chart Details Patient Name: [name], [name] Date of Service: 11/27/2018 9:30 AM Medical Record Number: [id number] Patient Account Number: [id number] Date of Birth/Sex: October 07, 1950 (68 y.o. M) Treating RN: Army Melia Primary Care Tomie Elko: Lamonte Sakai Other Clinician: Referring Ann Bohne: Lamonte Sakai Treating Mittie Knittel/Extender: Melburn Hake, HOYT Weeks in Treatment: 113 Vital Signs Height(in): 73 Pulse(bpm): 65 Weight(lbs): 168 Blood Pressure(mmHg): 159/71 Body Mass Index(BMI): 25 Temperature(F): 98.8 Respiratory Rate 18 (breaths/min): Photos: Wound Location: Right Toe Great - Plantar Right Toe Great - Dorsal Right Toe Second Wounding Event: Not Known Gradually Appeared Gradually Appeared Primary Etiology: Diabetic Wound/Ulcer of 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 Lower Extremity Lower Extremity Comorbid History: Anemia, Lymphedema, Anemia, Lymphedema, Anemia, Lymphedema, Congestive Heart Failure, Congestive Heart Failure, Congestive Heart Failure, Hypertension, Peripheral Hypertension, Peripheral Hypertension, Peripheral Venous Disease, Type II Venous Disease, Type II Venous Disease, Type II Diabetes, Neuropathy Diabetes, Neuropathy Diabetes, Neuropathy Date Acquired: 10/29/2017 05/22/2018 06/11/2016 Weeks of Treatment: 56 27 113 Wound Status: Open Open Open Pending Amputation on  No No Yes Presentation: Measurements L x W x D 0.7x1.4x0.1 0.5x0.4x0.1 0.2x0.2x0.1 (cm) Area (cm) : 0.77 0.157 0.031 Volume (cm) : 0.077 0.016 0.003 % Reduction in Area: 51.90% 80.40% 99.00% % Reduction in Volume: 51.90% 80.00% 99.00% Classification: Grade 1 Grade 1 Grade 1 Exudate Amount: Large Large Large Exudate Type: Serous Serous Serous Exudate Color: amber amber amber Wound Margin: Flat and Intact Flat and Intact Flat and Intact Granulation Amount: Large (  67-100%) Large (67-100%) Large (67-100%) Granulation Quality: Pink Pale Pink Necrotic Amount: None Present (0%) None Present (0%) None Present (0%) Exposed Structures: Collet, Nina E. ([id number]) Fat Layer (Subcutaneous Fat Layer (Subcutaneous Fat Layer (Subcutaneous Tissue) Exposed: Yes Tissue) Exposed: Yes Tissue) Exposed: Yes Epithelialization: Small (1-33%) Large (67-100%) Medium (34-66%) Wound Number: 20 21 22  Photos: Wound Location: Left Toe Second Left Toe Great Right Foot - Dorsal, Distal Wounding Event: Pressure Injury Shear/Friction Gradually Appeared Primary Etiology: Diabetic Wound/Ulcer of 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 Lower Extremity Lower Extremity Comorbid History: Anemia, Lymphedema, Anemia, Lymphedema, Anemia, Lymphedema, Congestive Heart Failure, Congestive Heart Failure, Congestive Heart Failure, Hypertension, Peripheral Hypertension, Peripheral Hypertension, Peripheral Venous Disease, Type II Venous Disease, Type II Venous Disease, Type II Diabetes, Neuropathy Diabetes, Neuropathy Diabetes, Neuropathy Date Acquired: 09/22/2018 10/13/2018 10/13/2018 Weeks of Treatment: 9 6 6  Wound Status: Open Open Open Pending Amputation on No No No Presentation: Measurements L x W x D 0.5x0.7x0.1 0.1x0.1x0.1 0.1x0.1x0.1 (cm) Area (cm) : 0.275 0.008 0.008 Volume (cm) : 0.027 0.001 0.001 % Reduction in Area: 93.00% 99.80% 99.90% % Reduction in Volume: 93.10% 99.70%  99.90% Classification: Grade 1 Grade 1 Grade 1 Exudate Amount: Medium Small Large Exudate Type: Serous Serous Serous Exudate Color: amber amber amber Wound Margin: Flat and Intact Flat and Intact Flat and Intact Granulation Amount: Large (67-100%) Large (67-100%) Large (67-100%) Granulation Quality: Pink Pink Pink Necrotic Amount: None Present (0%) None Present (0%) None Present (0%) Exposed Structures: Fat Layer (Subcutaneous Fascia: No Fascia: No Tissue) Exposed: Yes Fat Layer (Subcutaneous Fat Layer (Subcutaneous Tissue) Exposed: No Tissue) Exposed: No Tendon: No Tendon: No Muscle: No Muscle: No Joint: No Joint: No Bone: No Bone: No Limited to Skin Breakdown Limited to Skin Breakdown Epithelialization: Medium (34-66%) Large (67-100%) Large (67-100%) Treatment Notes Electronic Signature(s) Signed: 11/27/2018 11:51:11 AM By: Garald Braver ([id number]) Entered By: Army Melia on 11/27/2018 10:04:32 Meegan, [name] ([id number]) -------------------------------------------------------------------------------- [place] Details Patient Name: [name], [name] Date of Service: 11/27/2018 9:30 AM Medical Record Number: [id number] Patient Account Number: [id number] Date of Birth/Sex: 13-Jun-1950 (68 y.o. M) Treating RN: Army Melia Primary Care Trinity Haun: Lamonte Sakai Other Clinician: Referring Lilyanne Mcquown: Lamonte Sakai Treating Jullia Mulligan/Extender: Melburn Hake, HOYT Weeks in Treatment: 29 Active Inactive Abuse / Safety / Falls / Self Care Management Nursing Diagnoses: Potential for falls Goals: Patient will remain injury free related to falls Date Initiated: 09/21/2016 Target Resolution Date: 09/14/2017 Goal Status: Active Interventions: Assess fall risk on admission and as needed Notes: Nutrition Nursing Diagnoses: Potential for alteratiion in Nutrition/Potential for imbalanced nutrition Goals: Patient/caregiver agrees to and verbalizes  understanding of need to use nutritional supplements and/or vitamins as prescribed Date Initiated: 09/21/2016 Target Resolution Date: 09/14/2017 Goal Status: Active Interventions: Assess patient nutrition upon admission and as needed per policy Notes: Orientation to the Wound Care Program Nursing Diagnoses: Knowledge deficit related to the wound healing center program Goals: Patient/caregiver will verbalize understanding of the [place] Program Date Initiated: 09/21/2016 Target Resolution Date: 06/15/2017 Goal Status: Active Interventions: Provide education on orientation to the wound center [name], [name] ([id number]) Notes: Wound/Skin Impairment Nursing Diagnoses: Knowledge deficit related to smoking impact on wound healing Goals: Ulcer/skin breakdown will have a volume reduction of 30% by week 4 Date Initiated: 09/21/2016 Target Resolution Date: 08/17/2017 Goal Status: Active Ulcer/skin breakdown will have a volume reduction of 50% by week 8 Date Initiated: 09/21/2016 Target Resolution Date: 08/17/2017 Goal Status: Active Ulcer/skin breakdown  will have a volume reduction of 80% by week 12 Date Initiated: 09/21/2016 Target Resolution Date: 09/14/2017 Goal Status: Active Ulcer/skin breakdown will heal within 14 weeks Date Initiated: 09/21/2016 Target Resolution Date: 08/17/2017 Goal Status: Active Interventions: Assess patient/caregiver ability to obtain necessary supplies Assess patient/caregiver ability to perform ulcer/skin care regimen upon admission and as needed Assess ulceration(s) every visit Notes: Electronic Signature(s) Signed: 11/27/2018 11:51:11 AM By: Army Melia Entered By: Army Melia on 11/27/2018 10:03:59 [name], [name] ([id number]) -------------------------------------------------------------------------------- Pain Assessment Details Patient Name: [name], [name] Date of Service: 11/27/2018 9:30 AM Medical Record Number: [id number] Patient Account  Number: [id number] Date of Birth/Sex: [birth year] (68 y.o. M) Treating RN: Montey Hora Primary Care Aili Casillas: Lamonte Sakai Other Clinician: Referring Janey Petron: Lamonte Sakai Treating Katrine Radich/Extender: Melburn Hake, HOYT Weeks in Treatment: 113 Active Problems Location of Pain Severity and Description of Pain Patient Has Paino No Site Locations Pain Management and Medication Current Pain Management: Electronic Signature(s) Signed: 11/27/2018 4:23:33 PM By: Montey Hora Entered By: Montey Hora on 11/27/2018 09:43:38 Porte, [name] ([id number]) -------------------------------------------------------------------------------- Patient/Caregiver Education Details Patient Name: Erich, Navid E. Date of Service: 11/27/2018 9:30 AM Medical Record Number: [id number] Patient Account Number: [id number] Date of Birth/Gender: 1950/10/19 (68 y.o. M) Treating RN: Army Melia Primary Care Physician: Lamonte Sakai Other Clinician: Referring Physician: Lamonte Sakai Treating Physician/Extender: Sharalyn Ink in Treatment: 113 Education Assessment Education Provided To: Patient Education Topics Provided Wound/Skin Impairment: Handouts: Caring for Your Ulcer Methods: Demonstration, Explain/Verbal Responses: State content correctly Electronic Signature(s) Signed: 11/27/2018 11:51:11 AM By: Army Melia Entered By: Army Melia on 11/27/2018 10:10:32 Noh, [name] ([id number]) -------------------------------------------------------------------------------- Wound Assessment Details Patient Name: [name], [name] Date of Service: 11/27/2018 9:30 AM Medical Record Number: [id number] Patient Account Number: [id number] Date of Birth/Sex: 02-21-1951 (68 y.o. M) Treating RN: Montey Hora Primary Care Dagmawi Venable: Lamonte Sakai Other Clinician: Referring Ismeal Heider: Lamonte Sakai Treating Zain Lankford/Extender: Melburn Hake, HOYT Weeks in Treatment: 113 Wound Status Wound Number: 16 Primary Diabetic  Wound/Ulcer of the Lower Extremity Etiology: Wound Location: Right Toe Great - Plantar Wound Open Wounding Event: Not Known Status: Date Acquired: 10/29/2017 Comorbid Anemia, Lymphedema, Congestive Heart Weeks Of Treatment: 56 History: Failure, Hypertension, Peripheral Venous Clustered Wound: No Disease, Type II Diabetes, Neuropathy Photos Wound Measurements Length: (cm) 0.7 Width: (cm) 1.4 Depth: (cm) 0.1 Area: (cm) 0.77 Volume: (cm) 0.077 % Reduction in Area: 51.9% % Reduction in Volume: 51.9% Epithelialization: Small (1-33%) Tunneling: No Undermining: No Wound Description Classification: Grade 1 Wound Margin: Flat and Intact Exudate Amount: Large Exudate Type: Serous Exudate Color: amber Foul Odor After Cleansing: No Slough/Fibrino No Wound Bed Granulation Amount: Large (67-100%) Exposed Structure Granulation Quality: Pink Fat Layer (Subcutaneous Tissue) Exposed: Yes Necrotic Amount: None Present (0%) Electronic Signature(s) Signed: 11/27/2018 4:23:33 PM By: Montey Hora Entered By: Montey Hora on 11/27/2018 09:54:08 [name], [name] ([id number]) -------------------------------------------------------------------------------- Wound Assessment Details Patient Name: [name], Nichole E. Date of Service: 11/27/2018 9:30 AM Medical Record Number: [id number] Patient Account Number: [id number] Date of Birth/Sex: [birth year] (68 y.o. M) Treating RN: Montey Hora Primary Care Kairee Kozma: Lamonte Sakai Other Clinician: Referring Izayiah Tibbitts: Lamonte Sakai Treating Aeon Kessner/Extender: Melburn Hake, HOYT Weeks in Treatment: 113 Wound Status Wound Number: 18 Primary Diabetic Wound/Ulcer of the Lower Extremity Etiology: Wound Location: Right Toe Great - Dorsal Wound Open Wounding Event: Gradually Appeared Status: Date Acquired: 05/22/2018 Comorbid Anemia, Lymphedema, Congestive Heart Weeks Of Treatment: 27 History: Failure, Hypertension, Peripheral Venous Clustered Wound:  No Disease, Type II Diabetes, Neuropathy Photos Wound Measurements Length: (cm)  0.5 Width: (cm) 0.4 Depth: (cm) 0.1 Area: (cm) 0.157 Volume: (cm) 0.016 % Reduction in Area: 80.4% % Reduction in Volume: 80% Epithelialization: Large (67-100%) Tunneling: No Undermining: No Wound Description Classification: Grade 1 Wound Margin: Flat and Intact Exudate Amount: Large Exudate Type: Serous Exudate Color: amber Foul Odor After Cleansing: No Slough/Fibrino No Wound Bed Granulation Amount: Large (67-100%) Exposed Structure Granulation Quality: Pale Fat Layer (Subcutaneous Tissue) Exposed: Yes Necrotic Amount: None Present (0%) Electronic Signature(s) Signed: 11/27/2018 4:23:33 PM By: Montey Hora Entered By: Montey Hora on 11/27/2018 09:54:42 Bortner, Glade E. ([id number]) -------------------------------------------------------------------------------- Wound Assessment Details Patient Name: Seide, Garrus E. Date of Service: 11/27/2018 9:30 AM Medical Record Number: [id number] Patient Account Number: [id number] Date of Birth/Sex: 1951-04-05 (68 y.o. M) Treating RN: Montey Hora Primary Care Deion Forgue: Lamonte Sakai Other Clinician: Referring Murat Rideout: Lamonte Sakai Treating Carry Ortez/Extender: Melburn Hake, HOYT Weeks in Treatment: 113 Wound Status Wound Number: 2 Primary Diabetic Wound/Ulcer of the Lower Extremity Etiology: Wound Location: Right Toe Second Wound Open Wounding Event: Gradually Appeared Status: Date Acquired: 06/11/2016 Comorbid Anemia, Lymphedema, Congestive Heart Weeks Of Treatment: 113 History: Failure, Hypertension, Peripheral Venous Clustered Wound: No Disease, Type II Diabetes, Neuropathy Pending Amputation On Presentation Photos Wound Measurements Length: (cm) 0.2 Width: (cm) 0.2 Depth: (cm) 0.1 Area: (cm) 0.031 Volume: (cm) 0.003 % Reduction in Area: 99% % Reduction in Volume: 99% Epithelialization: Medium (34-66%) Tunneling: No Undermining:  No Wound Description Classification: Grade 1 Foul O Wound Margin: Flat and Intact Slough Exudate Amount: Large Exudate Type: Serous Exudate Color: amber dor After Cleansing: No /Fibrino No Wound Bed Granulation Amount: Large (67-100%) Exposed Structure Granulation Quality: Pink Fat Layer (Subcutaneous Tissue) Exposed: Yes Necrotic Amount: None Present (0%) Electronic Signature(s) Signed: 11/27/2018 4:23:33 PM By: Montey Hora Entered By: Montey Hora on 11/27/2018 09:55:21 Landi, Dezmon E. ([id number]) Hippler, Kelsie E. ([id number]) -------------------------------------------------------------------------------- Wound Assessment Details Patient Name: Ostrum, Easter E. Date of Service: 11/27/2018 9:30 AM Medical Record Number: [id number] Patient Account Number: [id number] Date of Birth/Sex: [birth year] (68 y.o. M) Treating RN: Montey Hora Primary Care Marialy Urbanczyk: Lamonte Sakai Other Clinician: Referring Lulia Schriner: Lamonte Sakai Treating Jerry Clyne/Extender: Melburn Hake, HOYT Weeks in Treatment: 113 Wound Status Wound Number: 20 Primary Diabetic Wound/Ulcer of the Lower Extremity Etiology: Wound Location: Left Toe Second Wound Open Wounding Event: Pressure Injury Status: Date Acquired: 09/22/2018 Comorbid Anemia, Lymphedema, Congestive Heart Weeks Of Treatment: 9 History: Failure, Hypertension, Peripheral Venous Clustered Wound: No Disease, Type II Diabetes, Neuropathy Photos Wound Measurements Length: (cm) 0.5 Width: (cm) 0.7 Depth: (cm) 0.1 Area: (cm) 0.275 Volume: (cm) 0.027 % Reduction in Area: 93% % Reduction in Volume: 93.1% Epithelialization: Medium (34-66%) Tunneling: No Undermining: No Wound Description Classification: Grade 1 Foul O Wound Margin: Flat and Intact Slough Exudate Amount: Medium Exudate Type: Serous Exudate Color: amber dor After Cleansing: No /Fibrino No Wound Bed Granulation Amount: Large (67-100%) Exposed Structure Granulation Quality:  Pink Fat Layer (Subcutaneous Tissue) Exposed: Yes Necrotic Amount: None Present (0%) Electronic Signature(s) Signed: 11/27/2018 4:23:33 PM By: Montey Hora Entered By: Montey Hora on 11/27/2018 09:55:56 Fandino, [name] ([id number]) -------------------------------------------------------------------------------- Wound Assessment Details Patient Name: Vavrek, Starsky E. Date of Service: 11/27/2018 9:30 AM Medical Record Number: [id number] Patient Account Number: [id number] Date of Birth/Sex: [birth year] (68 y.o. M) Treating RN: Montey Hora Primary Care Nathalee Smarr: Lamonte Sakai Other Clinician: Referring Mckinzy Fuller: Lamonte Sakai Treating Saturnino Liew/Extender: Melburn Hake, HOYT Weeks in Treatment: 113 Wound Status Wound Number: 21 Primary Diabetic Wound/Ulcer of the Lower Extremity Etiology: Wound Location: Left Toe Great Wound Open  Wounding Event: Shear/Friction Status: Date Acquired: 10/13/2018 Comorbid Anemia, Lymphedema, Congestive Heart Weeks Of Treatment: 6 History: Failure, Hypertension, Peripheral Venous Clustered Wound: No Disease, Type II Diabetes, Neuropathy Photos Wound Measurements Length: (cm) 0.1 % Reduction in Width: (cm) 0.1 % Reduction in Depth: (cm) 0.1 Epithelializat Area: (cm) 0.008 Tunneling: Volume: (cm) 0.001 Undermining: Area: 99.8% Volume: 99.7% ion: Large (67-100%) No No Wound Description Classification: Grade 1 Foul Odor Aft Wound Margin: Flat and Intact Slough/Fibrin Exudate Amount: Small Exudate Type: Serous Exudate Color: amber er Cleansing: No o No Wound Bed Granulation Amount: Large (67-100%) Exposed Structure Granulation Quality: Pink Fascia Exposed: No Necrotic Amount: None Present (0%) Fat Layer (Subcutaneous Tissue) Exposed: No Tendon Exposed: No Muscle Exposed: No Joint Exposed: No Bone Exposed: No Limited to Skin Breakdown CASTIN, DONAGHUE ([id number]) Electronic Signature(s) Signed: 11/27/2018 4:23:33 PM By: Montey Hora Entered By: Montey Hora on 11/27/2018 09:56:32 Zarling, [name] ([id number]) -------------------------------------------------------------------------------- Wound Assessment Details Patient Name: Rucinski, Haim E. Date of Service: 11/27/2018 9:30 AM Medical Record Number: [id number] Patient Account Number: [id number] Date of Birth/Sex: 02-Aug-1950 (68 y.o. M) Treating RN: Montey Hora Primary Care Atwell Mcdanel: Lamonte Sakai Other Clinician: Referring Sion Reinders: Lamonte Sakai Treating Aniza Shor/Extender: Melburn Hake, HOYT Weeks in Treatment: 113 Wound Status Wound Number: 22 Primary Diabetic Wound/Ulcer of the Lower Extremity Etiology: Wound Location: Right Foot - Dorsal, Distal Wound Open Wounding Event: Gradually Appeared Status: Date Acquired: 10/13/2018 Comorbid Anemia, Lymphedema, Congestive Heart Weeks Of Treatment: 6 History: Failure, Hypertension, Peripheral Venous Clustered Wound: No Disease, Type II Diabetes, Neuropathy Photos Wound Measurements Length: (cm) 0.1 % Reduction i Width: (cm) 0.1 % Reduction i Depth: (cm) 0.1 Epithelializa Area: (cm) 0.008 Tunneling: Volume: (cm) 0.001 Undermining: n Area: 99.9% n Volume: 99.9% tion: Large (67-100%) No No Wound Description Classification: Grade 1 Foul Odor Af Wound Margin: Flat and Intact Slough/Fibri Exudate Amount: Large Exudate Type: Serous Exudate Color: amber ter Cleansing: No no No Wound Bed Granulation Amount: Large (67-100%) Exposed Structure Granulation Quality: Pink Fascia Exposed: No Necrotic Amount: None Present (0%) Fat Layer (Subcutaneous Tissue) Exposed: No Tendon Exposed: No Muscle Exposed: No Joint Exposed: No Bone Exposed: No Limited to Skin Breakdown JAIEL, SARACENO ([id number]) Electronic Signature(s) Signed: 11/27/2018 4:23:33 PM By: Montey Hora Entered By: Montey Hora on 11/27/2018 09:57:03 Briney, [name]  ([id number]) -------------------------------------------------------------------------------- Vitals Details Patient Name: Jaquez, Telly E. Date of Service: 11/27/2018 9:30 AM Medical Record Number: [id number] Patient Account Number: [id number] Date of Birth/Sex: 1950/06/03 (68 y.o. M) Treating RN: Montey Hora Primary Care Analyce Tavares: Lamonte Sakai Other Clinician: Referring Mialynn Shelvin: Lamonte Sakai Treating Wilhemina Grall/Extender: Melburn Hake, HOYT Weeks in Treatment: 113 Vital Signs Time Taken: 09:43 Temperature (F): 98.8 Height (in): 69 Pulse (bpm): 65 Weight (lbs): 168 Respiratory Rate (breaths/min): 18 Body Mass Index (BMI): 24.8 Blood Pressure (mmHg): 159/71 Reference Range: 80 - 120 mg / dl Electronic Signature(s) Signed: 11/27/2018 4:23:33 PM By: Montey Hora Entered By: Montey Hora on 11/27/2018 09:44:44

## 2018-11-27 NOTE — Progress Notes (Addendum)
CALEY, VOLKERT ([id number]) Visit Report for 11/27/2018 Chief Complaint Document Details Patient Name: [name], [name] Date of Service: 11/27/2018 9:30 AM Medical Record Number: [id number] Patient Account Number: [id number] Date of Birth/Sex: Sep 16, 1950 (68 y.o. M) Treating RN: Army Melia Primary Care Provider: Lamonte Sakai Other Clinician: Referring Provider: Lamonte Sakai Treating Provider/Extender: Melburn Hake, Darril Patriarca Weeks in Treatment: 113 Information Obtained from: Patient Chief Complaint Patient presents for treatment of an open diabetic ulcer to both feet Electronic Signature(s) Signed: 11/27/2018 10:02:31 AM By: Worthy Keeler PA-C Entered By: Worthy Keeler on 11/27/2018 10:02:31 Colonna, [name] ([id number]) -------------------------------------------------------------------------------- HPI Details Patient Name: [name], [name] Date of Service: 11/27/2018 9:30 AM Medical Record Number: [id number] Patient Account Number: [id number] Date of Birth/Sex: Apr 14, 1950 (68 y.o. M) Treating RN: Army Melia Primary Care Provider: Lamonte Sakai Other Clinician: Referring Provider: Lamonte Sakai Treating Provider/Extender: Melburn Hake, Aubra Pappalardo Weeks in Treatment: 68 History of Present Illness HPI Description: 68 year old patient here to see as for bilateral feet ulceration to on his left first and second toe and 2 on his right first and second toe, which she's had for about 4 months. 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 Donella Stade. The patient is also undergoing workup by medical oncology for a intra-abdominal lymphadenopathy. 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 He is also status post appendectomy, inguinal lymph node biopsy, rectal biopsy  and rectal examination under anesthesia. he currently smokes cigarettes about half packet a day. In March of this year he was seen by Dr. Hortencia Pilar, for evaluation of bilateral varicose veins and besides wearing compression stockings he had recommended laser ablation of the right and left great saphenous veins to eleviate the symptoms and complications of severe superficial venous reflux disease. He also recommended lymphedema pumps for better control of his lymphedema. The patient recently has had on 08/23/2016, right greater saphenous vein ablation with the laser energy Earlier lower extremity venous reflux examination done on 05/08/2016 showed no DVT or SVT both lower legs but incompetence of bilateral great saphenous veins was present. A lower arterial study was also done and there was no significant right lower and left lower extremity problems based on a normal toe brachial index bilaterally and the ABI was 1.21 the left and 1.23 on the right. His post ablation venous duplex examination showed successful ablation of the right GS vein with thrombus formation 2 below the right saphenofemoral junction. The deep system was patent without evidence of thrombosis and this was done on 08/30/2016. 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 Addendum: regarding his x-rays done today and x-ray of the left foot -- IMPRESSION: No objective evidence of osteomyelitis. There are soft tissue changes which may reflect cellulitis. X-ray of the right foot -- IMPRESSION:Findings compatible with cellulitis of the toes. No objective evidence of osteomyelitis is observed. 10/01/16 on evaluation today patient's wounds appeared to be doing some better. I did review the x-rays as well which showed no evidence of osteomyelitis although there was evidence on x-ray of cellulitis. He fortunately is not  having any discomfort although he continues to have some swelling. He does not remember being on any antibiotics  recently. 10/15/16 on evaluation today patient's wounds overall appear to be doing better although he does have a new location noted on the left foot. Fortunately he is not having significant pain. It almost has the appearance that something is rubbing on the end of his toes but he wears the open toe shoes and according to what he is telling me never wears anything that would rub on his foot. There is no evidence of infection and specifically no evidence of a fungal infection 10/22/16 On evaluation today patient's wounds appeared to be doing better compared to last week in regard to his bilateral lower extremities. Fortunately I happy with how things are progressing although he still has ulcers I feel like that he is improving and appropriate manner. 11/12/16 on evaluation today patient appears to be doing well in regard to his bilateral feet and the respective wounds. We have been using surrounding her dressings along with an antifungal cream which seems to be doing very well. He has no bilateral dysfunction noticed that the rituals are weight loss at this point. He also has no nausea or vomiting a note purulent discharge. He did see Vein and vascular today and he tells me that they told him he could have surgery for his venous stasis but they did not feel like it was worth it in his words. Fortunately patient's wounds do appear to be getting sneakily better. [name], [name] ([id number]) 11/26/2016 -- he says he is going to have some surgery during this week at [facility] for possibly a colon resection. 12/31/2016 -- the patient has been noncompliant with his smoking and I'm not sure whether he is also started drinking again. He continues to be very nonchalant about his care 01/14/2017 -- the patient's HandP has been reviewed well and I understand he is being compliant with trying to give  up smoking and his local dressing changes. He does not have any surgical options of 4 to him by his vascular surgeons.he was last seen in early August by Dr. Hortencia Pilar who recommended compression stockings,and possibly lymph pumps in 2-3 months after doing a review ultrasound. 01/28/2017 - the patient did not have any fresh complaints but on examination I noted a large lacerated wound on the plantar aspect of his right fourth toe which had a lot of necrotic debris and it probes down to bone. 02/07/2017 -- x-ray of the right foot -- IMPRESSION: Soft tissue swelling about the first through fourth toes consistent with cellulitis. New destructive change in the tuft of the distal phalanx of the great toe is consistent with osteomyelitis. 02/14/2017 -- the patient's MRI is pending this coming Monday and he still continues to smoke. We have again gone over off loading of his wounds in great detail and he says he's been compliant. 02/21/2017 -- MR of the right foot -- IMPRESSION: 1. Soft tissue ulcer at the tip of the first, second and third toe knows. Cortical irregularity and bone marrow edema in the first distal phalanx most concerning for osteomyelitis. Mild marrow edema in the second and third distal phalanx without definite cortical destruction which may reflect early osteomyelitis versus reactive marrow edema. 2. Soft tissue edema surrounding the first phalanx most consistent with cellulitis. the patient was also recently evaluated by his medical oncologist Dr. 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 She is treating him with weekly Procrit. She is also keeping intra-abdominal lymphadenopathy and right lower  lobe lung nodule under observation. 04/04/2017 -- he was seen by Dr. Adrian Prows on 03/25/2017 -- after review he empirically put him on ciprofloxacin and doxycycline as they have good bone penetration and good  bioavailability and it will cover the usual pathogens and diabetic foot osteomyelitis. He will check inflammatory markers and plan a 67-89 week old records. C-reactive protein was 0.3 and the ESR was 72 04/18/17 on evaluation today patient appears to be doing about the same in regard to his lower extremity wounds bilaterally. He has continued to use the antifungal cream which does seem to be beneficial. Nonetheless the ulcers do seem to in some areas be epithelial eyes over and in other areas are still open. He is having no significant discomfort. 04/25/17-he is here in follow-up evaluation for multiple ulcerations to multiple toes bilaterally. He states he did see Dr. Ola Spurr again last week and continues antibiotic therapy. He is voicing no complaints or concerns, will continue with current treatment plan will possibility of adding compression therapy next week after an additional week of treatment/lotions to BLE prescribed by Dr Ola Spurr 05/02/17 he is here in follow up for for multiple ulcers to multiple toes bilaterally. we will stop using antifungal cream and will continue with silvercel and follow up next week 05/09/17-he is here in follow-up for multiple ulcerations to multiple toes bilaterally. There is improvement in appearance. He has not completely stopped using antifungal cream, but admits he has not using it between the toes. He has an appointment with Dr. Ola Spurr on 2/11, continues on doxycycline and Cipro. It has been 5 weeks of antibiotic therapy, we will order plain film xray to evaluate for osteomyelitis next week, prior to follow up with ID. Will continue with silvercel and follow up next week 05/16/17-he is here in follow-up evaluation for multiple ulcerations to multiple toes bilaterally and new wound to the right posterior heel. There is essentially no change in appearance, deteriorating measurements; he has a history of waxing and waning measurements. He admits that he  continues to apply moisturizer/cream/ointment to his toes despite weekly reminders to only apply silvercel to his toes. He states that he thinks the surgical shoe contributed to the superficial ulcer to his posterior heel, he is unable to articulate if this was an area of dry cracked skin as he has a similar area to the left heel. He now is wearing open toed slippers. He has an appointment with Dr. Ola Spurr on 2/11. We have ordered x-rays for her bilateral feet; he was advised to obtain the x-rays today or tomorrow. He will follow-up next week 05/23/17-he is here in follow-up evaluation for multiple ulcerations to multiple toes bilaterally and the right posterior heel. There is improvement in maceration. He has been compliant and not applying any moisturizing agent to his toes. He has been using Lac-Hydrin for his lower extremities with improvement. He did not go to his appointment on Monday with Dr. Ola Spurr secondary to financial concerns. X-rays for her bilateral feet showed: LEFT FOOT with slight erosion of the tuft of the distal phalanges of the left first and second toe suspicious for osteomyelitis, RIGHT FOOT with 1.erosion of the tufts of the distal Malia, Erric E. ([id number]) phalanges of the right first second and possibly third toes consistent with osteomyelitis, 2 no definitive abnormality of the calcaneus is seen on the images obtained, 3. Plantar calcaneal degenerative spur. We briefly discussed hyperbaric adjunctive therapy for treatment of chronic refractory osteomyelitis. I do not find an a1c in  EMR, will contact PCP for record, or order if needed. He has been encouraged to contact Dr Ola Spurr office regarding the follow-up appointment, encouraged him to inquire about payment plan. We will continue with same treatment plan and follow-up next week. He states he is still taking antibiotics and has "a lot" left. He states he has been taking them as directed, 2 pills twice daily.  According to Dr. Blane Ohara office notes he was originally started on 12/17 for 4 weeks and extended on 1/14 for an additional 4 weeks. He should be done with his antibiotic therapy, he was advised to bring his bottles and to his next appointment, we will contact pharmacy. 05/30/17-he is here in follow-up evaluation for multiple ulcerations to multiple toes bilaterally and the right posterior heel. He is accompanied by his brother-in-law. Wounds are stable. He has yet to make up with Dr. Ola Spurr. We contacted his PCP, with no record of recent A1c we will draw an A1c. His brother-in-law states that he was taken off all of his diabetic medication secondary to kidney function. 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 The antibiotics were counted and have approximately 2 weeks left, although they should be complete. The pill organizer reveals missing days. We discussed the need for consistent medications, to have optimal benefit of medication. He has a cousin that lives with him and he will ask her to check his organizer daily. He has been advised to follow up with Dr Ola Spurr, and will go by the office today. He has been advised to quit smoking. 06/06/17-he is here in follow up evaluation. He has had to make an appointment with Dr. Ola Spurr. He did have blood work obtained, a1c 5. He continues to take antibiotic therapy. Significant improvement in bilateral lower extremity edema with compression therapy. Essentially no change in ulcerations to toes. He states he is "going to try something different" and "let me know next week" if it works; he would not provide any additional information and was encouraged to follow our orders. We will follow up next week 06/13/17-he is here in follow-up evaluation. He has an appointment with Dr. Ola Spurr tomorrow morning. He states he purchased an ointment from  [facility] and applied to his toes for 3 days, he does not remember the name of the ointment. There is improvement to his wounds, minimal maceration. He continues to take antibiotic therapy, this should have been completed last month. His brother-in-law who regularly accompanies his appointments was asked to take the bottles to the appointment tomorrow with Dr. Ola Spurr so he is aware. We will continue with 3 layer compression, and order OPEN TOE compression 20-30mmHg; we will apply compression stockings next week. He continues to smoke, smoked "2 cigarettes" last week 06/20/17 on evaluation today patient did receive his compression stockings which he has with him today for both lower extremities. With that being said he tells me at this point in time that he is very happy to have these he really is not a big fan of the compression wraps that we have been utilizing although they have been of great benefit for him. Nonetheless at this point he does want to switch to the compression stockings. In my opinion as long as he is continuing with compression I'm okay with the stockings or the wraps. 06/27/17-he is here in follow-up evaluation for multiple ulcerations to his bilateral toes. There is some improvement in appearance. He is compliant  in wearing his compression stockings with significant improvement in lower extremity edema. He saw Dr Ola Spurr on 3/8, per his notes they would redraw ESR and CRP; plan to continue antibiotic therapy if these remain elevated. I do not see an ESR or CRP level in Epic. The patient continues to take antibiotics. 07/11/17-He is here in follow-up evaluation for multiple ulcerations to multiple toes bilaterally. He presents with complete epithelialization to the right third toe; there has been no deterioration. He continues on antibiotic therapy. He will follow-up next week 07/18/17-He is here in follow-up evaluation for multiple ulcerations to multiple toes bilaterally. He  continues to make improvement. He continues on antibiotic therapy. He states he has been using something additional to our orders, he does not elaborate but states he will bring it in next week. 07/25/17-He is here in follow up evaluation for multiple ulcerations to bilateral toes. He is stable. He has completed antibiotic therapy. He admits to "filing" his toes after showers each evening, this is what he was referencing last week; he does not filing for the wounds. We will switch to [facility] and monitor for any improvement, he will follow-up next week 08/01/17-He is here in follow-up evaluation. He admits to "picking" at his toes after cleansing yesterday, leading to new areas of tissue loss on the bilateral second toe. There is improvement noted to the bilateral great toe. We will dress toes today and hope that they maintain until Monday where he will come in for a nurse visit. He has been advised, multiple times with expressed verbalization, to change the dressings to silvercel if the dressings get wet prior to [facility] appointment. 08/08/17-He is here in follow-up evaluation for bilateral first and second toe ulcerations. There is significant improvement to all ulcerations since last visit. We will switch to [name] to all wounds and he will continue with nurse visits on a Monday/Thursday schedule and follow-up with me in 2 weeks. He continues to smoke, 1-3 cigarettes per day, and has been encouraged to not smoke until his next follow-up in 2 weeks. 08/15/17 on evaluation today patient's ulcers on his toes actually appear to be doing fairly well the right to ulcers may be a little bit more moist compared to the left I'm not really sure exactly why as the openings appear to be very small and I'm not seeing any evidence of anything significant as far as you lightest or otherwise. Nonetheless this may just be a small setback he's [name], [name] ([id number]) been doing very well with the  [facility] Dressing 08/22/17-He is here in follow-up evaluation for ulcerations to his bilateral first and second toes, there is small/scant amount of drainage noted on today's dressing. He continues with Hydrofera Blue. He continues to smoke, 1 cigarette a day. Voices no complaint or concerns, compliant and compression therapy today. He'll follow-up next week 08/29/17-He is here in follow-up evaluation for ulcerations to his bilateral first and second toes, with a new wound to the left third toe. He states he cut himself while cutting the toenail. He presents today with more maceration and increased measurements; waxing and waning measurements and moisture has been an ongoing issue. He continues to smoke a proximally 1 cigarette per day, but states he has not smoked since Sunday. We will initiate medihoney daily and evaluate next week. He presents today without compression stockings 09/05/17-He is here in follow-up evaluation for ulcerations to bilateral first and second and left third toe. He did not pick up the  medihoney and therefore has not been changing his dressing. He admits to inconsistent where of compression stockings, admits to pain to his bilateral lower extremities with unilateral edema (right greater than left). The ulcerations to multiple toes are more macerated and larger in size than last week. He is wearing compression therapy today. He admits to an overall feeling of weakness and fatigue and has been encouraged to contact his PCP for evaluation; he has a known history of anemia and intraabdominal lymphadenopathy. We will return to silvercel and he will follow up next week 09/19/17-He is here in follow-up evaluation for ulcerations to bilateral first and second toe.he was unable to make last week's appointment. The culture that was obtained on 5/30 grew clindamycin sensitive MRSA; he was initiated on 6/4 but did not start it until 6/7. There is significant improvement in all  wounds, healing to left second and third toe. He also admits to having no alcohol or smoking for the last 6 days. We will continue with same treatment plan I will extend the clindamycin for an additional 10 days and he will follow-up next week. 09/26/17- He is here in follow evaluation for multiple ulcerations to multiple toes bilaterally. He continues to be non-compliant with dressing, compression therapy. He states he is taking the antibiotics as prescribed, although his recall is inconsistent. There is noted deterioration in all ulcers. We will continue same treatment plan, antibiotic therapy and he will follow up next week. 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 This information is obtained from the medical record, as he cannot articulate specifics to his diagnosis or treatment plan. 10/17/17-He is here in follow-up evaluation for multiple ulcerations to multiple toes bilaterally. There is chronic waxing and waning improvement/deterioration. On today's exam they appear improved, dry without maceration. He continues to apply a variety of topical agents, stating that he has switched between the blue product and silver product. In my opinion, this will be a chronic waxing and waning giving his intermittent compliance. He will follow-up in 2 weeks. As it relates to the biopsy taken from his buttock, he has not followed up with that and has not started radiation therapy. 10/31/17-He is here for evaluation for multiple ulcerations to multiple toes bilaterally. Continues with waxing and waning improvement versus deterioration. They are stable on today's exam without significant maceration. 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 We will continue with every 2 week follow-up appointments 11/14/17- He  is seen in follow-up evaluation for multiple ulcerations to multiple bilateral toes. He is scheduled for excision of basal cell carcinoma to his right buttock on Monday 8/12. He continues to have chronic waxing and waning of his ulcers; today is a stable day with minimal drainage and left ear is better than right toes. I will recount to infectious disease regarding chronic suppressive therapy. He admits to being more compliant with compression stockings, although he is not wearing any today. He has been encouraged to continue with dressing changes as ordered and compression therapy daily. He will be seen in 2 weeks 11/28/17-He is seen in follow up evaluation for multiple ulcerations to multiple toes bilaterally. He had an overnight admission at [facility] (8/11-8/12) for symptomatic anemia, received two units prbc. His surgical date has been moved to 9/94 excision of basal cell carcinoma to  the right buttock. He has been noncompliant in wearing compression stockings, applying topical treatment as ordered; he has been applying a "cream", Hydrofera Blue, silvercel. He is currently on no antibiotic therapy; we will send for re-referral to ID for suppressive therapy given his multiple co-morbidities, poor surgical candidate, unwilling to have toe amputation and general non-adherence. 12/19/17 on evaluation today patient actually appears to be doing very well in regard to his toes at least compared to last time I saw him. He again has been coming to our office for quite a bit of time. 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 He states is hurting a little bit but doing well in general. I'm see were not taking care of this area dermatology is. He does have his appointment with infectious disease upcoming which they re-consult for suppressive therapy that's next Monday. 01/02/18 on evaluation  today patient actually appears to be doing rather well in regard to his toe ulcers although he tells me he has been put in an ointment on this which I am concerned may be causing some additional moisture buildup which we really do not want. He supposed be using silver cell which is mainly thing I want him to be utilizing. Fortunately there does not appear to be any evidence of worsening infection which is good news. He has had surgery on his gluteal region where he had an excision of a cancerous area fortunately that seems to be doing well although he is having some discomfort. 02/06/18 on evaluation today patient actually appears to be doing well at all locations except for his second toe use bilaterally Sinor, Dunbar E. ([id number]) where the distal tip is still wrong open. He did state that he can't stand for dry skin to buildup on his toes subsequently as a question and further it sounds as if he actually pills this all which is likely keeping the toe is wrong and open. Also think that's what happened in the bottom of his foot although that appears to have healed at this point. There does not appear to be any evidence of infection. 02/20/18 on evaluation today patient appears to be doing better in regard to his foot ulcers. Everything has shown signs of improvement which is excellent news. Overall I'm very pleased with how things appear currently. The patient states he's not picking her point at any skin and that he wears shoes at all times when he is ambulating at home. Both are things that I've advocated and recommended for him in the past. 03/13/18 on evaluation today patient actually appears to be doing very well in regard to the bilateral toes in the overall appearance of the wound bed locations. Fortunately there does not appear to be any signs of infection at this time. He states that he is very pleased with how things seem to be progressing to be honest as am I. 03/27/1899 evaluation today  patient appears to be doing well in regard to his toe ulcers. He just has a few areas remaining and these appear to be showing signs of improvement as well which is great news. Overall I have been impressed with the progress of the past several weeks. 04/10/18 on evaluation today patient appears to be doing a little bit worse in regard to the right plantar foot at the regional the first metatarsal as well as the left second toe. He tells me that  he ran out of bandages he also tells me he's been wearing his flip-flops a lot at home as well as a new pair of boots that he received as well. This is as opposed to the postop shoes that we had previously given him. Nonetheless overall I feel like that he has had a little bit more friction to the area which is causing things to look a little bit worse today. 04/24/18 on evaluation today patient's wounds for the most part appear to be doing excellent especially in regard to the bilateral second toes. With that being said he does have issues with the wound still on the first metatarsal region and first toe of the right foot which have not completely closed at this point. Nonetheless I do feel like that he is making progress which is good news. 05/08/18 on evaluation today patient's wounds appear to be doing just a little bit worse than normal simply due to the fact that he has not had dressing supplies. He apparently owed $35 in order for them to be shipped and unfortunately is not able to pay that currently. We did give him information for the [employer] today which will hopefully help cover his dressing supplies. He was thankful for this he states is gonna contact them this afternoon. Fortunately otherwise or does not hear any signs of infection. 05/22/18 on evaluation today patient appears to be doing somewhat poorly in regard to his toes. Upon further questioning it appears that he actually has been pulling skin off of the toes which "drive  him crazy". Subsequently as he does this he calls his new areas to tear open and I think this is one of the reasons why his toes are not healing as appropriately as we would like. He fortunately has no signs of infection at this time. With that being said I'm still concerned about the possibility of a fungal infection even though there doesn't appear to be anything bacterial. Potentially nystatin powder could be of benefit for him as far as trying to help this in that regard. 06/05/18 on evaluation today patient actually appears to be doing very well in regard to his foot ulcers. Fortunately there's no signs of infection. He's been tolerating the dressing changes without complication. I'm very pleased with how things seem to be progressing. Fortunately there's no evidence of active infection at this point I do believe nystatin powder has been of benefit for him. 06/19/18 on evaluation today patient actually appears to be doing a little worse compared to last evaluation. Tells me that he has not had any supplies for two weeks. With that being said his plantar foot wounds on the right actually appear to be doing very well. It's actually a second toes in the first toes that are the worst. I think these are areas the was able to get to and pick and he tells me in fact that he destine to pick out some of the dry skin because it causes him annoyance. Nonetheless I think this is somewhat detrimental to him I think it's worth keeping part of the wound open in particular. Fortunately there is no sign of infection at this time although I think that is a risk which I discussed specifically that he's not careful you can end up reopening this and causing greater problems for himself. 07/03/18 on evaluation today patient's wound bed actually shows evidence of good granulation at this time and epithelialization as far as the wounds in general are concerned. He  has been tolerating the dressing changes including the  nystatin without complication. This is good news. With that being said he seems to be still having some trouble with the left second toe distally at this point. 4/9; most of the wounds look quite satisfactory. He requires debridement of the plantar right great toe otherwise everything Goin, Casimiro E. ([id number]) looks superficial to improved. His left second toe is healed. 08/14/18 on evaluation today patient actually appears to be doing very well in regard to his foot ulcerations. In fact he just has three small areas of the right foot still remaining open the left foot is closed and dry at this point. Overall I feel like he is doing quite well which is excellent news. Fortunately there's no signs of active infection. 08/28/18 on evaluation today patient's toes of the right foot actually shows signs of being a little bit worse in regard to the overall open areas at this point. He does tell me that he "washes them well" when he is taking a shower. Again I explained that I want to make sure he's not washing them to aggressively as this could potentially cause issues with overall worsening. 09/12/18 on evaluation today patient actually appears to be doing well in regard to his right foot ulcers. He's been tolerating the dressing changes without complication. Fortunately there's no signs of active infection at this time. With that being said he does have a new one on the dorsal surface of his foot just proximal to his toes he does not know really where this came from. 09/25/18 on evaluation today patient actually appears to be doing somewhat better in regard to his right foot ulcers although he has a new opening on the left second toe unfortunately. This is something that seems to be back and forth he tells me did Pickett scan at this area that is why it's now open yet again. No fevers, chills, nausea, or vomiting noted at this time. 10/16/18 on evaluation today patient actually appears to be doing decently  poorly in regard to his ulcers on his feet. He tells me that for the past 3-4 days he is not having to take therefore is not been putting on any dressings he came in with the socks actually just stuck to the toes with a been draining. He also has not picked up the nystatin powder that was sent to the pharmacy with refills all he has to do is go pick that up he has not done so. He also told me for the more that he feels like his "blood is low" which seems to be referring to his hemoglobin [facility] and subsequently states he needs to go there when he leaves here in order to see about a blood transfusion. Nonetheless I think that's something that he definitely needs to get on top of if he feels like that's necessary. Fortunately there's no signs of active infection at this time. 10/30/18 on evaluation today patient appears to be doing a little worse in regard to his lower extremity ulcers specifically his toes for the most part although he does have an open wound on the right dorsal foot. He has been picking at his toes which damages the skin and leads to what we are seeing today. Fortunately there is no signs of active infection systemically or locally. With that being said I am concerned about the fact that I'm not sure he's ever really gonna feel as long  as he keeps messing with his toes such as picking and bothering them which causes them to reopen on a regular basis. 11/13/18 upon inspection today patient appears to be doing somewhat more poorly currently compared to the last time I saw him. He still has not been wearing his compression stockings even if he did they do not cover the toes which I think may be more of a complication problem for him to be honest. He many compression stockings that do indeed cover his toes but he also needs to have the willpower to actually wear these in a regular basis so that they actually do him any good at all. He also tells  me that he is out of dressing supplies and that he needs to go pick some up so that he will have them. Same is true of the powder although he tells me it's at the pharmacy he just has to go pick it up. 11/20/18 on evaluation today patient actually appears to be doing better with regard to his toe ulcers at this point. Fortunately he does seem to be perform the dressing changes appropriately and this has shown signs of improvement as best I can tell today. He did tell me that he attempted to use his compression stocking for about 15 minutes this morning before coming into the clinic unfortunately he tells me this caused him a lot of pain. I am not sure if there the proper size he was unable to get the new ones that we had ordered for some reason they did not get sent I am assuming this has something to do with insurance. 11/27/2018 on evaluation today patient appears to be doing okay with regard to his foot/toe sores at this time. He has been tolerating the dressing changes without complication though he tells me he does need to get some of the nystatin powder currently. With that being said he was supposed to go this morning for his arterial studies although he states he did not know where to go. He tells me that he drove around and went to several offices but not the vascular office because he did not figure "that was where he was supposed to be". Nonetheless he also had and ask exactly where he should go whenever they call to make his appointment. Nonetheless there is a lot of confusion in this regard I am getting give him the number to the vascular office for him to call and subsequently see about getting this rescheduled. Electronic Signature(s) Signed: 11/27/2018 11:09:13 AM By: Worthy Keeler PA-C Entered By: Worthy Keeler on 11/27/2018 11:09:12 [name], [name] ([id number]) [name], [name] ([id number]) -------------------------------------------------------------------------------- Physical  Exam Details Patient Name: Minnifield, Ellis E. Date of Service: 11/27/2018 9:30 AM Medical Record Number: [id number] Patient Account Number: [id number] Date of Birth/Sex: Aug 15, 1950 (68 y.o. M) Treating RN: Army Melia Primary Care Provider: Lamonte Sakai Other Clinician: Referring Provider: Lamonte Sakai Treating Provider/Extender: Melburn Hake, Clinton Dragone Weeks in Treatment: 6 Constitutional Well-nourished and well-hydrated in no acute distress. Respiratory normal breathing without difficulty. clear to auscultation bilaterally. Cardiovascular regular rate and rhythm with normal S1, S2. Psychiatric this patient is able to make decisions and demonstrates good insight into disease process. Alert and Oriented x 3. pleasant and cooperative. Notes Overall patient's wounds do not appear to be doing significantly worse which is good news in fact I feel like in a lot of ways he is doing a lot better at this time based on what I am seeing. Fortunately  there is no evidence of active infection currently. No fevers, chills, nausea, vomiting, or diarrhea. Electronic Signature(s) Signed: 11/27/2018 11:10:02 AM By: Worthy Keeler PA-C Entered By: Worthy Keeler on 11/27/2018 11:10:02 Adolph, [name] ([id number]) -------------------------------------------------------------------------------- Physician Orders Details Patient Name: [name], [name] Date of Service: 11/27/2018 9:30 AM Medical Record Number: [id number] Patient Account Number: [id number] Date of Birth/Sex: 1951/02/11 (68 y.o. M) Treating RN: Army Melia Primary Care Provider: Lamonte Sakai Other Clinician: Referring Provider: Lamonte Sakai Treating Provider/Extender: Melburn Hake, Semisi Biela Weeks in Treatment: 103 Verbal / Phone Orders: No Diagnosis Coding ICD-10 Coding Code Description L97.521 Non-pressure chronic ulcer of other part of left foot limited to breakdown of skin L97.511 Non-pressure chronic ulcer of other part of right foot limited to  breakdown of skin E11.621 Type 2 diabetes mellitus with foot ulcer I87.323 Chronic venous hypertension (idiopathic) with inflammation of bilateral lower extremity I89.0 Lymphedema, not elsewhere classified F17.218 Nicotine dependence, cigarettes, 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 abuse 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, right ankle and foot Wound Cleansing Wound #16 Right,Plantar Toe Great o Clean wound with Normal Saline. Wound #18 Right,Dorsal Toe Great o Clean wound with Normal Saline. Wound #2 Right Toe Second o Clean wound with Normal Saline. Wound #20 Left Toe Second o Clean wound with Normal Saline. Wound #21 Left Toe Great o Clean wound with Normal Saline. Wound #22 Right,Distal,Dorsal Foot o Clean wound with Normal Saline. Anesthetic (add to Medication List) Wound #16 Right,Plantar Toe Great o Topical Lidocaine 4% cream applied to wound bed prior to debridement (In Clinic Only). Wound #18 Right,Dorsal Toe Great o Topical Lidocaine 4% cream applied to wound bed prior to debridement (In Clinic Only). Wound #2 Right Toe Second o Topical Lidocaine 4% cream applied to wound bed prior to debridement (In Clinic Only). Wound #20 Left Toe Second o Topical Lidocaine 4% cream applied to wound bed prior to debridement (In Clinic Only). [name], [name] ([id number]) Wound #21 Left Toe Great o Topical Lidocaine 4% cream applied to wound bed prior to debridement (In Clinic Only). Wound #22 Right,Distal,Dorsal Foot o Topical Lidocaine 4% cream applied to wound bed prior to debridement (In Clinic Only). Skin Barriers/Peri-Wound Care Wound #16 Right,Plantar Toe Great o Antifungal powder-Nystatin Wound #18 Right,Dorsal Toe Great o Antifungal powder-Nystatin Wound #2 Right Toe Second o Antifungal powder-Nystatin Wound #20 Left Toe Second o Antifungal powder-Nystatin Wound #21 Left Toe  Great o Antifungal powder-Nystatin Wound #22 Right,Distal,Dorsal Foot o Antifungal powder-Nystatin Primary Wound Dressing Wound #16 Right,Plantar Toe Great o Silver Alginate - secure with tape Wound #18 Right,Dorsal Toe Great o Silver Alginate - secure with tape Wound #2 Right Toe Second o Silver Alginate - secure with tape Wound #20 Left Toe Second o Silver Alginate - secure with tape Wound #21 Left Toe Great o Silver Alginate - secure with tape Wound #22 Right,Distal,Dorsal Foot o Silver Alginate - secure with tape Dressing Change Frequency Wound #16 Right,Plantar Toe Great o Change dressing every other day. Wound #18 Right,Dorsal Toe Great o Change dressing every other day. Wound #2 Right Toe Second o Change dressing every other day. Wound #20 Left Toe Second Shaddock, [name] ([id number]) o Change dressing every other day. Wound #21 Left Toe Great o Change dressing every other day. Wound #22 Right,Distal,Dorsal Foot o Change dressing every other day. Follow-up Appointments Wound #16 Right,Plantar Toe Great o Return Appointment in 1 week. Wound #18 Right,Dorsal Toe Great o Return Appointment in 1 week. Wound #2 Right  Toe Second o Return Appointment in 1 week. Wound #20 Left Toe Second o Return Appointment in 1 week. Wound #21 Left Toe Great o Return Appointment in 1 week. Wound #22 Right,Distal,Dorsal Foot o Return Appointment in 1 week. Edema Control Wound #16 Right,Plantar Toe Great o Other: - double layer tubi grip F bilateral Additional Orders / Instructions Wound #16 Right,Plantar Toe Great o Stop Smoking o Increase protein intake. Wound #18 Right,Dorsal Toe Great o Stop Smoking o Increase protein intake. Wound #2 Right Toe Second o Stop Smoking o Increase protein intake. Wound #20 Left Toe Second o Stop Smoking o Increase protein intake. Wound #21 Left Toe Great o Stop Smoking o Increase  protein intake. Wound #22 Right,Distal,Dorsal Foot o Stop Smoking o Increase protein intake. [name], [name] ([id number]) Medications-please add to medication list. Wound #16 Right,Plantar Toe Great o Other: - Nystatin Powder Wound #18 Right,Dorsal Toe Great o Other: - Nystatin Powder Wound #2 Right Toe Second o Other: - Nystatin Powder Wound #20 Left Toe Second o Other: - Nystatin Powder Wound #21 Left Toe Great o Other: - Nystatin Powder Wound #22 Right,Distal,Dorsal Foot o Other: - Nystatin Powder Electronic Signature(s) Signed: 11/27/2018 11:51:11 AM By: Army Melia Signed: 11/27/2018 5:39:21 PM By: Worthy Keeler PA-C Entered By: Army Melia on 11/27/2018 10:09:30 Tomas, [name] ([id number]) -------------------------------------------------------------------------------- Problem List Details Patient Name: Sarkisyan, Maksymilian E. Date of Service: 11/27/2018 9:30 AM Medical Record Number: [id number] Patient Account Number: [id number] Date of Birth/Sex: 1951/03/19 (68 y.o. M) Treating RN: Army Melia Primary Care Provider: Lamonte Sakai Other Clinician: Referring Provider: Lamonte Sakai Treating Provider/Extender: Melburn Hake, Martavia Tye Weeks in Treatment: 113 Active Problems ICD-10 Evaluated Encounter Code Description Active Date Today Diagnosis L97.521 Non-pressure chronic ulcer of other part of left foot limited to 09/21/2016 No Yes breakdown of skin L97.511 Non-pressure chronic ulcer of other part of right foot limited to 09/21/2016 No Yes breakdown of skin E11.621 Type 2 diabetes mellitus with foot ulcer 09/21/2016 No Yes I87.323 Chronic venous hypertension (idiopathic) with inflammation of 09/21/2016 No Yes bilateral lower extremity I89.0 Lymphedema, not elsewhere classified 09/21/2016 No Yes F17.218 Nicotine dependence, cigarettes, with other nicotine-induced 09/21/2016 No Yes disorders F10.19 Alcohol abuse with unspecified alcohol-induced disorder 09/21/2016 No  Yes M86.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, right ankle and foot 02/21/2017 No Yes Inactive Problems Resolved Problems [name], [name] ([id number]) Electronic Signature(s) Signed: 11/27/2018 10:02:23 AM By: Worthy Keeler PA-C Entered By: Worthy Keeler on 11/27/2018 10:02:22 [name], [name] EMarland Kitchen ([id number]) -------------------------------------------------------------------------------- Progress Note Details Patient Name: Croke, Tramond E. Date of Service: 11/27/2018 9:30 AM Medical Record Number: [id number] Patient Account Number: [id number] Date of Birth/Sex: February 18, 1951 (68 y.o. M) Treating RN: Army Melia Primary Care Provider: Lamonte Sakai Other Clinician: Referring Provider: Lamonte Sakai Treating Provider/Extender: Melburn Hake, Berdell Nevitt Weeks in Treatment: 113 Subjective Chief Complaint Information obtained from Patient Patient presents for treatment of an open diabetic ulcer to both feet History of Present Illness (HPI) 68 year old patient here to see as for bilateral feet ulceration to on his left first and second toe and 2 on his right first and second toe, which she's had for about 4 months. 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 Donella Stade. The patient is also undergoing workup by medical oncology for a intra-abdominal lymphadenopathy. 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 He is also status post appendectomy, inguinal lymph node biopsy, rectal biopsy and rectal examination under anesthesia. he currently smokes cigarettes about half packet a day. In March of this year he was seen by Dr. Hortencia Pilar, for evaluation of bilateral varicose veins and besides wearing compression stockings he had recommended laser ablation of the right  and left great saphenous veins to eleviate the symptoms and complications of severe superficial venous reflux disease. He also recommended lymphedema pumps for better control of his lymphedema. The patient recently has had on 08/23/2016, right greater saphenous vein ablation with the laser energy Earlier lower extremity venous reflux examination done on 05/08/2016 showed no DVT or SVT both lower legs but incompetence of bilateral great saphenous veins was present. A lower arterial study was also done and there was no significant right lower and left lower extremity problems based on a normal toe brachial index bilaterally and the ABI was 1.21 the left and 1.23 on the right. His post ablation venous duplex examination showed successful ablation of the right GS vein with thrombus formation 2 below the right saphenofemoral junction. The deep system was patent without evidence of thrombosis and this was done on 08/30/2016. 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 Addendum: regarding his x-rays done today and x-ray of the left foot -- IMPRESSION: No objective evidence of osteomyelitis. There are soft tissue changes which may reflect cellulitis. X-ray of the right foot -- IMPRESSION:Findings compatible with cellulitis of the toes. No objective evidence of osteomyelitis is observed. 10/01/16 on evaluation today patient's wounds appeared to be doing some better. I did review the x-rays as well which showed no evidence of osteomyelitis although there was evidence on x-ray of cellulitis. He fortunately is not having any discomfort although he continues to have some swelling. He does not remember being on any antibiotics recently. 10/15/16 on evaluation today patient's wounds overall appear to be doing better although he does have a new location noted on the left foot. Fortunately he is not having  significant pain. It almost has the appearance that something is rubbing on the end of his toes but he wears the open toe shoes and according to what he is telling me never wears anything that would rub on his foot. There is no evidence of infection and specifically no evidence of a fungal infection 10/22/16 On evaluation today patient's wounds appeared to be doing better compared to last week in regard to his bilateral lower extremities. Fortunately I happy with how things are progressing although he still has ulcers I feel like that he is improving and appropriate manner. BINH, DOTEN ([id number]) 11/12/16 on evaluation today patient appears to be doing well in regard to his bilateral feet and the respective wounds. We have been using surrounding her dressings along with an antifungal cream which seems to be doing very well. He has no bilateral dysfunction noticed that the rituals are weight loss at this point. He also has no nausea or vomiting a note purulent discharge. He did see Vein and vascular today and he tells me that they told him he could have surgery for his venous stasis but they did not feel like it was worth it in his words. Fortunately patient's wounds do appear to be getting sneakily better. 11/26/2016 -- he says he is going to have some surgery during  this week at [facility] for possibly a colon resection. 12/31/2016 -- the patient has been noncompliant with his smoking and I'm not sure whether he is also started drinking again. He continues to be very nonchalant about his care 01/14/2017 -- the patient's HandP has been reviewed well and I understand he is being compliant with trying to give up smoking and his local dressing changes. He does not have any surgical options of 4 to him by his vascular surgeons.he was last seen in early August by Dr. Hortencia Pilar who recommended compression stockings,and possibly lymph pumps in 2-3 months after doing a review  ultrasound. 01/28/2017 - the patient did not have any fresh complaints but on examination I noted a large lacerated wound on the plantar aspect of his right fourth toe which had a lot of necrotic debris and it probes down to bone. 02/07/2017 -- x-ray of the right foot -- IMPRESSION: Soft tissue swelling about the first through fourth toes consistent with cellulitis. New destructive change in the tuft of the distal phalanx of the great toe is consistent with osteomyelitis. 02/14/2017 -- the patient's MRI is pending this coming Monday and he still continues to smoke. We have again gone over off loading of his wounds in great detail and he says he's been compliant. 02/21/2017 -- MR of the right foot -- IMPRESSION: 1. Soft tissue ulcer at the tip of the first, second and third toe knows. Cortical irregularity and bone marrow edema in the first distal phalanx most concerning for osteomyelitis. Mild marrow edema in the second and third distal phalanx without definite cortical destruction which may reflect early osteomyelitis versus reactive marrow edema. 2. Soft tissue edema surrounding the first phalanx most consistent with cellulitis. the patient was also recently evaluated by his medical oncologist Dr. 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 She is treating him with weekly Procrit. She is also keeping intra-abdominal lymphadenopathy and right lower lobe lung nodule under observation. 04/04/2017 -- he was seen by Dr. Adrian Prows on 03/25/2017 -- after review he empirically put him on ciprofloxacin and doxycycline as they have good bone penetration and good bioavailability and it will cover the usual pathogens and diabetic foot osteomyelitis. He will check inflammatory markers and plan a 58-7 week old records. C-reactive protein was 0.3 and the ESR was 72 04/18/17 on evaluation today patient appears to be doing about the same  in regard to his lower extremity wounds bilaterally. He has continued to use the antifungal cream which does seem to be beneficial. Nonetheless the ulcers do seem to in some areas be epithelial eyes over and in other areas are still open. He is having no significant discomfort. 04/25/17-he is here in follow-up evaluation for multiple ulcerations to multiple toes bilaterally. He states he did see Dr. Ola Spurr again last week and continues antibiotic therapy. He is voicing no complaints or concerns, will continue with current treatment plan will possibility of adding compression therapy next week after an additional week of treatment/lotions to BLE prescribed by Dr Ola Spurr 05/02/17 he is here in follow up for for multiple ulcers to multiple toes bilaterally. we will stop using antifungal cream and will continue with silvercel and follow up next week 05/09/17-he is here in follow-up for multiple ulcerations to multiple toes bilaterally. There is improvement in appearance. He has not completely stopped using antifungal cream, but admits he has not using it between the toes. He has  an appointment with Dr. Ola Spurr on 2/11, continues on doxycycline and Cipro. It has been 5 weeks of antibiotic therapy, we will order plain film xray to evaluate for osteomyelitis next week, prior to follow up with ID. Will continue with silvercel and follow up next week 05/16/17-he is here in follow-up evaluation for multiple ulcerations to multiple toes bilaterally and new wound to the right posterior heel. There is essentially no change in appearance, deteriorating measurements; he has a history of waxing and waning measurements. He admits that he continues to apply moisturizer/cream/ointment to his toes despite weekly reminders to only apply silvercel to his toes. He states that he thinks the surgical shoe contributed to the superficial ulcer to his posterior heel, he is unable to articulate if this was an area of dry  cracked skin as he has a similar area to the left heel. He now is wearing open toed slippers. He has an appointment with Dr. Ola Spurr on 2/11. We have ordered x-rays for her bilateral feet; Chuck, Akira E. ([id number]) he was advised to obtain the x-rays today or tomorrow. He will follow-up next week 05/23/17-he is here in follow-up evaluation for multiple ulcerations to multiple toes bilaterally and the right posterior heel. There is improvement in maceration. He has been compliant and not applying any moisturizing agent to his toes. He has been using Lac-Hydrin for his lower extremities with improvement. He did not go to his appointment on Monday with Dr. Ola Spurr secondary to financial concerns. X-rays for her bilateral feet showed: LEFT FOOT with slight erosion of the tuft of the distal phalanges of the left first and second toe suspicious for osteomyelitis, RIGHT FOOT with 1.erosion of the tufts of the distal phalanges of the right first second and possibly third toes consistent with osteomyelitis, 2 no definitive abnormality of the calcaneus is seen on the images obtained, 3. Plantar calcaneal degenerative spur. We briefly discussed hyperbaric adjunctive therapy for treatment of chronic refractory osteomyelitis. I do not find an a1c in EMR, will contact PCP for record, or order if needed. He has been encouraged to contact Dr Ola Spurr office regarding the follow-up appointment, encouraged him to inquire about payment plan. We will continue with same treatment plan and follow-up next week. He states he is still taking antibiotics and has "a lot" left. He states he has been taking them as directed, 2 pills twice daily. According to Dr. Blane Ohara office notes he was originally started on 12/17 for 4 weeks and extended on 1/14 for an additional 4 weeks. He should be done with his antibiotic therapy, he was advised to bring his bottles and to his next appointment, we will  contact pharmacy. 05/30/17-he is here in follow-up evaluation for multiple ulcerations to multiple toes bilaterally and the right posterior heel. He is accompanied by his brother-in-law. Wounds are stable. He has yet to make up with Dr. Ola Spurr. We contacted his PCP, with no record of recent A1c we will draw an A1c. His brother-in-law states that he was taken off all of his diabetic medication secondary to kidney function. 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 The antibiotics were counted and have approximately 2 weeks left, although they should be complete. The pill organizer reveals missing days. We discussed the need for consistent medications, to have optimal benefit of medication. He has a cousin that lives with him and he will ask her to check his organizer daily. He  has been advised to follow up with Dr Ola Spurr, and will go by the office today. He has been advised to quit smoking. 06/06/17-he is here in follow up evaluation. He has had to make an appointment with Dr. Ola Spurr. He did have blood work obtained, a1c 5. He continues to take antibiotic therapy. Significant improvement in bilateral lower extremity edema with compression therapy. Essentially no change in ulcerations to toes. He states he is "going to try something different" and "let me know next week" if it works; he would not provide any additional information and was encouraged to follow our orders. We will follow up next week 06/13/17-he is here in follow-up evaluation. He has an appointment with Dr. Ola Spurr tomorrow morning. He states he purchased an ointment from [facility] and applied to his toes for 3 days, he does not remember the name of the ointment. There is improvement to his wounds, minimal maceration. He continues to take antibiotic therapy, this should have been completed last month. His brother-in-law who  regularly accompanies his appointments was asked to take the bottles to the appointment tomorrow with Dr. Ola Spurr so he is aware. We will continue with 3 layer compression, and order OPEN TOE compression 20-30mmHg; we will apply compression stockings next week. He continues to smoke, smoked "2 cigarettes" last week 06/20/17 on evaluation today patient did receive his compression stockings which he has with him today for both lower extremities. With that being said he tells me at this point in time that he is very happy to have these he really is not a big fan of the compression wraps that we have been utilizing although they have been of great benefit for him. Nonetheless at this point he does want to switch to the compression stockings. In my opinion as long as he is continuing with compression I'm okay with the stockings or the wraps. 06/27/17-he is here in follow-up evaluation for multiple ulcerations to his bilateral toes. There is some improvement in appearance. He is compliant in wearing his compression stockings with significant improvement in lower extremity edema. He saw Dr Ola Spurr on 3/8, per his notes they would redraw ESR and CRP; plan to continue antibiotic therapy if these remain elevated. I do not see an ESR or CRP level in Epic. The patient continues to take antibiotics. 07/11/17-He is here in follow-up evaluation for multiple ulcerations to multiple toes bilaterally. He presents with complete epithelialization to the right third toe; there has been no deterioration. He continues on antibiotic therapy. He will follow-up next week 07/18/17-He is here in follow-up evaluation for multiple ulcerations to multiple toes bilaterally. He continues to make improvement. He continues on antibiotic therapy. He states he has been using something additional to our orders, he does not elaborate but states he will bring it in next week. 07/25/17-He is here in follow up evaluation for multiple  ulcerations to bilateral toes. He is stable. He has completed antibiotic therapy. He admits to "filing" his toes after showers each evening, this is what he was referencing last week; he does not filing for the wounds. We will switch to [facility] and monitor for any improvement, he will follow-up next week 08/01/17-He is here in follow-up evaluation. He admits to "picking" at his toes after cleansing yesterday, leading to new areas of tissue loss on the bilateral second toe. There is improvement noted to the bilateral great toe. We will dress toes today and hope that they maintain until Monday where he will come in for a  nurse visit. He has been advised, multiple times with expressed verbalization, to change the dressings to silvercel if the dressings get wet prior to [facility] appointment. 08/08/17-He is here in follow-up evaluation for bilateral first and second toe ulcerations. There is significant improvement to all ulcerations since last visit. We will switch to [facility] to all wounds and he will continue with nurse visits on a Nehring, Trevion E. ([id number]) Monday/Thursday schedule and follow-up with me in 2 weeks. He continues to smoke, 1-3 cigarettes per day, and has been encouraged to not smoke until his next follow-up in 2 weeks. 08/15/17 on evaluation today patient's ulcers on his toes actually appear to be doing fairly well the right to ulcers may be a little bit more moist compared to the left I'm not really sure exactly why as the openings appear to be very small and I'm not seeing any evidence of anything significant as far as you lightest or otherwise. Nonetheless this may just be a small setback he's been doing very well with the [facility] Dressing 08/22/17-He is here in follow-up evaluation for ulcerations to his bilateral first and second toes, there is small/scant amount of drainage noted on today's dressing. He continues with Hydrofera Blue. He continues to smoke, 1  cigarette a day. Voices no complaint or concerns, compliant and compression therapy today. He'll follow-up next week 08/29/17-He is here in follow-up evaluation for ulcerations to his bilateral first and second toes, with a new wound to the left third toe. He states he cut himself while cutting the toenail. He presents today with more maceration and increased measurements; waxing and waning measurements and moisture has been an ongoing issue. He continues to smoke a proximally 1 cigarette per day, but states he has not smoked since Sunday. We will initiate medihoney daily and evaluate next week. He presents today without compression stockings 09/05/17-He is here in follow-up evaluation for ulcerations to bilateral first and second and left third toe. He did not pick up the medihoney and therefore has not been changing his dressing. He admits to inconsistent where of compression stockings, admits to pain to his bilateral lower extremities with unilateral edema (right greater than left). The ulcerations to multiple toes are more macerated and larger in size than last week. He is wearing compression therapy today. He admits to an overall feeling of weakness and fatigue and has been encouraged to contact his PCP for evaluation; he has a known history of anemia and intraabdominal lymphadenopathy. We will return to silvercel and he will follow up next week 09/19/17-He is here in follow-up evaluation for ulcerations to bilateral first and second toe.he was unable to make last week's appointment. The culture that was obtained on 5/30 grew clindamycin sensitive MRSA; he was initiated on 6/4 but did not start it until 6/7. There is significant improvement in all wounds, healing to left second and third toe. He also admits to having no alcohol or smoking for the last 6 days. We will continue with same treatment plan I will extend the clindamycin for an additional 10 days and he will follow-up next week. 09/26/17-  He is here in follow evaluation for multiple ulcerations to multiple toes bilaterally. He continues to be non-compliant with dressing, compression therapy. He states he is taking the antibiotics as prescribed, although his recall is inconsistent. There is noted deterioration in all ulcers. We will continue same treatment plan, antibiotic therapy and he will follow up next week. 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 This information is obtained from the medical record, as he cannot articulate specifics to his diagnosis or treatment plan. 10/17/17-He is here in follow-up evaluation for multiple ulcerations to multiple toes bilaterally. There is chronic waxing and waning improvement/deterioration. On today's exam they appear improved, dry without maceration. He continues to apply a variety of topical agents, stating that he has switched between the blue product and silver product. In my opinion, this will be a chronic waxing and waning giving his intermittent compliance. He will follow-up in 2 weeks. As it relates to the biopsy taken from his buttock, he has not followed up with that and has not started radiation therapy. 10/31/17-He is here for evaluation for multiple ulcerations to multiple toes bilaterally. Continues with waxing and waning improvement versus deterioration. They are stable on today's exam without significant maceration. 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 We will continue with every 2 week follow-up appointments 11/14/17- He is seen in follow-up evaluation for multiple ulcerations to multiple bilateral toes. He is scheduled for excision of basal cell carcinoma to his right buttock on Monday 8/12. He continues to have chronic waxing and waning of his ulcers; today is a stable  day with minimal drainage and left ear is better than right toes. I will recount to infectious disease regarding chronic suppressive therapy. He admits to being more compliant with compression stockings, although he is not wearing any today. He has been encouraged to continue with dressing changes as ordered and compression therapy daily. He will be seen in 2 weeks 11/28/17-He is seen in follow up evaluation for multiple ulcerations to multiple toes bilaterally. He had an overnight admission at [facility] (8/11-8/12) for symptomatic anemia, received two units prbc. His surgical date has been moved to 9/94 excision of basal cell carcinoma to the right buttock. He has been noncompliant in wearing compression stockings, applying topical treatment as ordered; he has been applying a "cream", Hydrofera Blue, silvercel. He is currently on no antibiotic therapy; we will send for re-referral to ID for suppressive therapy given his multiple co-morbidities, poor surgical candidate, unwilling to have toe amputation and general non-adherence. 12/19/17 on evaluation today patient actually appears to be doing very well in regard to his toes at least compared to last time I saw him. He again has been coming to our office for quite a bit of time. 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 He states is hurting a little bit but doing well in general. I'm see were not taking care of this area dermatology is. He does have his appointment with infectious disease upcoming which they re-consult for suppressive therapy that's next Monday. 01/02/18 on evaluation today patient actually appears to be doing rather well in regard to his toe ulcers although he tells me he Copelan, Treson E. ([id number]) has been put in an ointment on this which I am concerned may be causing some additional moisture buildup which we  really do not want. He supposed be using silver cell which is mainly thing I want him to be utilizing. Fortunately there does not appear to be any evidence of worsening infection which is good news. He has had surgery on his gluteal region  where he had an excision of a cancerous area fortunately that seems to be doing well although he is having some discomfort. 02/06/18 on evaluation today patient actually appears to be doing well at all locations except for his second toe use bilaterally where the distal tip is still wrong open. He did state that he can't stand for dry skin to buildup on his toes subsequently as a question and further it sounds as if he actually pills this all which is likely keeping the toe is wrong and open. Also think that's what happened in the bottom of his foot although that appears to have healed at this point. There does not appear to be any evidence of infection. 02/20/18 on evaluation today patient appears to be doing better in regard to his foot ulcers. Everything has shown signs of improvement which is excellent news. Overall I'm very pleased with how things appear currently. The patient states he's not picking her point at any skin and that he wears shoes at all times when he is ambulating at home. Both are things that I've advocated and recommended for him in the past. 03/13/18 on evaluation today patient actually appears to be doing very well in regard to the bilateral toes in the overall appearance of the wound bed locations. Fortunately there does not appear to be any signs of infection at this time. He states that he is very pleased with how things seem to be progressing to be honest as am I. 03/27/1899 evaluation today patient appears to be doing well in regard to his toe ulcers. He just has a few areas remaining and these appear to be showing signs of improvement as well which is great news. Overall I have been impressed with the progress of the past several  weeks. 04/10/18 on evaluation today patient appears to be doing a little bit worse in regard to the right plantar foot at the regional the first metatarsal as well as the left second toe. He tells me that he ran out of bandages he also tells me he's been wearing his flip-flops a lot at home as well as a new pair of boots that he received as well. This is as opposed to the postop shoes that we had previously given him. Nonetheless overall I feel like that he has had a little bit more friction to the area which is causing things to look a little bit worse today. 04/24/18 on evaluation today patient's wounds for the most part appear to be doing excellent especially in regard to the bilateral second toes. With that being said he does have issues with the wound still on the first metatarsal region and first toe of the right foot which have not completely closed at this point. Nonetheless I do feel like that he is making progress which is good news. 05/08/18 on evaluation today patient's wounds appear to be doing just a little bit worse than normal simply due to the fact that he has not had dressing supplies. He apparently owed $35 in order for them to be shipped and unfortunately is not able to pay that currently. We did give him information for the [employer] today which will hopefully help cover his dressing supplies. He was thankful for this he states is gonna contact them this afternoon. Fortunately otherwise or does not hear any signs of infection. 05/22/18 on evaluation today patient appears to be doing somewhat poorly in regard to his toes. Upon further questioning it appears that he actually  has been pulling skin off of the toes which "drive him crazy". Subsequently as he does this he calls his new areas to tear open and I think this is one of the reasons why his toes are not healing as appropriately as we would like. He fortunately has no signs of infection at this time. With that  being said I'm still concerned about the possibility of a fungal infection even though there doesn't appear to be anything bacterial. Potentially nystatin powder could be of benefit for him as far as trying to help this in that regard. 06/05/18 on evaluation today patient actually appears to be doing very well in regard to his foot ulcers. Fortunately there's no signs of infection. He's been tolerating the dressing changes without complication. I'm very pleased with how things seem to be progressing. Fortunately there's no evidence of active infection at this point I do believe nystatin powder has been of benefit for him. 06/19/18 on evaluation today patient actually appears to be doing a little worse compared to last evaluation. Tells me that he has not had any supplies for two weeks. With that being said his plantar foot wounds on the right actually appear to be doing very well. It's actually a second toes in the first toes that are the worst. I think these are areas the was able to get to and pick and he tells me in fact that he destine to pick out some of the dry skin because it causes him annoyance. Nonetheless I think this is somewhat detrimental to him I think it's worth keeping part of the wound open in particular. Fortunately there is no sign of infection at this time although I think that is a risk which I discussed specifically that he's not careful you can end up reopening this and causing greater problems for himself. TYBERIUS, RYNER ([id number]) 07/03/18 on evaluation today patient's wound bed actually shows evidence of good granulation at this time and epithelialization as far as the wounds in general are concerned. He has been tolerating the dressing changes including the nystatin without complication. This is good news. With that being said he seems to be still having some trouble with the left second toe distally at this point. 4/9; most of the wounds look quite satisfactory. He  requires debridement of the plantar right great toe otherwise everything looks superficial to improved. His left second toe is healed. 08/14/18 on evaluation today patient actually appears to be doing very well in regard to his foot ulcerations. In fact he just has three small areas of the right foot still remaining open the left foot is closed and dry at this point. Overall I feel like he is doing quite well which is excellent news. Fortunately there's no signs of active infection. 08/28/18 on evaluation today patient's toes of the right foot actually shows signs of being a little bit worse in regard to the overall open areas at this point. He does tell me that he "washes them well" when he is taking a shower. Again I explained that I want to make sure he's not washing them to aggressively as this could potentially cause issues with overall worsening. 09/12/18 on evaluation today patient actually appears to be doing well in regard to his right foot ulcers. He's been tolerating the dressing changes without complication. Fortunately there's no signs of active infection at this time. With that being said he does have a new one on the dorsal surface of his foot just proximal to  his toes he does not know really where this came from. 09/25/18 on evaluation today patient actually appears to be doing somewhat better in regard to his right foot ulcers although he has a new opening on the left second toe unfortunately. This is something that seems to be back and forth he tells me did Pickett scan at this area that is why it's now open yet again. No fevers, chills, nausea, or vomiting noted at this time. 10/16/18 on evaluation today patient actually appears to be doing decently poorly in regard to his ulcers on his feet. He tells me that for the past 3-4 days he is not having to take therefore is not been putting on any dressings he came in with the socks actually just stuck to the toes with a been draining. He also  has not picked up the nystatin powder that was sent to the pharmacy with refills all he has to do is go pick that up he has not done so. He also told me for the more that he feels like his "blood is low" which seems to be referring to his hemoglobin [facility] and subsequently states he needs to go there when he leaves here in order to see about a blood transfusion. Nonetheless I think that's something that he definitely needs to get on top of if he feels like that's necessary. Fortunately there's no signs of active infection at this time. 10/30/18 on evaluation today patient appears to be doing a little worse in regard to his lower extremity ulcers specifically his toes for the most part although he does have an open wound on the right dorsal foot. He has been picking at his toes which damages the skin and leads to what we are seeing today. Fortunately there is no signs of active infection systemically or locally. With that being said I am concerned about the fact that I'm not sure he's ever really gonna feel as long as he keeps messing with his toes such as picking and bothering them which causes them to reopen on a regular basis. 11/13/18 upon inspection today patient appears to be doing somewhat more poorly currently compared to the last time I saw him. He still has not been wearing his compression stockings even if he did they do not cover the toes which I think may be more of a complication problem for him to be honest. He many compression stockings that do indeed cover his toes but he also needs to have the willpower to actually wear these in a regular basis so that they actually do him any good at all. He also tells me that he is out of dressing supplies and that he needs to go pick some up so that he will have them. Same is true of the powder although he tells me it's at the pharmacy he just has to go pick it up. 11/20/18 on evaluation today patient  actually appears to be doing better with regard to his toe ulcers at this point. Fortunately he does seem to be perform the dressing changes appropriately and this has shown signs of improvement as best I can tell today. He did tell me that he attempted to use his compression stocking for about 15 minutes this morning before coming into the clinic unfortunately he tells me this caused him a lot of pain. I am not sure if there the proper size he was unable to get the new ones  that we had ordered for some reason they did not get sent I am assuming this has something to do with insurance. 11/27/2018 on evaluation today patient appears to be doing okay with regard to his foot/toe sores at this time. He has been tolerating the dressing changes without complication though he tells me he does need to get some of the nystatin powder currently. With that being said he was supposed to go this morning for his arterial studies although he states he did not know where to go. He tells me that he drove around and went to several offices but not the vascular office because he did not figure "that was where he was supposed to be". Nonetheless he also had and ask exactly where he should go whenever they call to make his appointment. Nonetheless there is a lot of confusion in this regard I am getting give him the number to the vascular office for him to call and subsequently see about getting this rescheduled. BARNET, BENAVIDES ([id number]) Patient History Information obtained from Patient. Social History Current every day smoker, Marital Status - Widowed, Alcohol Use - Daily - quit drinking about a week ago, Drug Use - No History, Caffeine Use - Moderate. Medical History Eyes Denies history of Cataracts, Glaucoma, Optic Neuritis Ear/Nose/Mouth/Throat Denies history of Chronic sinus problems/congestion, Middle ear problems Hematologic/Lymphatic Patient has history of Anemia, Lymphedema Denies history of  Hemophilia, Human Immunodeficiency Virus, Sickle Cell Disease Respiratory Denies history of Aspiration, Asthma, Chronic Obstructive Pulmonary Disease (COPD), Pneumothorax, Sleep Apnea, Tuberculosis Cardiovascular Patient has history of Congestive Heart Failure, Hypertension, Peripheral Venous Disease Denies history of Angina, Arrhythmia, Coronary Artery Disease, Deep Vein Thrombosis, Hypotension, Myocardial Infarction, Peripheral Arterial Disease, Phlebitis, Vasculitis Gastrointestinal Denies history of Cirrhosis , Colitis, Crohn s, Hepatitis A, Hepatitis B, Hepatitis C Endocrine Patient has history of Type II Diabetes Genitourinary Denies history of End Stage Renal Disease Immunological Denies history of Lupus Erythematosus, Raynaud s, Scleroderma Integumentary (Skin) Denies history of History of Burn, History of pressure wounds Musculoskeletal Denies history of Gout, Rheumatoid Arthritis, Osteoarthritis, Osteomyelitis Neurologic Patient has history of Neuropathy Denies history of Dementia, Quadriplegia, Paraplegia, Seizure Disorder Medical And Surgical History Notes 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 Respiratory Denies complaints or symptoms of Chronic or frequent coughs, Shortness of Breath. Cardiovascular Complains or has symptoms of LE edema. Denies complaints or symptoms of Chest pain. Neurologic Denies complaints or symptoms of Numbness/parasthesias, Focal/Weakness. Psychiatric Denies complaints or symptoms of Anxiety, Claustrophobia. TREVELL, PARISEAU ([id number]) Objective Constitutional Well-nourished and well-hydrated in no acute distress. Vitals Time Taken: 9:43 AM, Height: 69 in, Weight: 168 lbs, BMI: 24.8, Temperature: 98.8 F, Pulse: 65 bpm, Respiratory Rate: 18 breaths/min, Blood  Pressure: 159/71 mmHg. Respiratory normal breathing without difficulty. clear to auscultation bilaterally. Cardiovascular regular rate and rhythm with normal S1, S2. Psychiatric this patient is able to make decisions and demonstrates good insight into disease process. Alert and Oriented x 3. pleasant and cooperative. General Notes: Overall patient's wounds do not appear to be doing significantly worse which is good news in fact I feel like in a lot of ways he is doing a lot better at this time based on what I am seeing. Fortunately there is no evidence of active infection currently. No fevers, chills, nausea, vomiting, or diarrhea. Integumentary (Hair, Skin) Wound #16 status is Open. Original cause of wound  was Not Known. The wound is located on the [employer]. The wound measures 0.7cm length x 1.4cm width x 0.1cm depth; 0.77cm^2 area and 0.077cm^3 volume. There is Fat Layer (Subcutaneous Tissue) Exposed exposed. There is no tunneling or undermining noted. There is a large amount of serous drainage noted. The wound margin is flat and intact. There is large (67-100%) pink granulation within the wound bed. There is no necrotic tissue within the wound bed. Wound #18 status is Open. Original cause of wound was Gradually Appeared. The wound is located on the Right,Dorsal [employer] The wound measures 0.5cm length x 0.4cm width x 0.1cm depth; 0.157cm^2 area and 0.016cm^3 volume. There is Fat Layer (Subcutaneous Tissue) Exposed exposed. There is no tunneling or undermining noted. There is a large amount of serous drainage noted. The wound margin is flat and intact. There is large (67-100%) pale granulation within the wound bed. There is no necrotic tissue within the wound bed. Wound #2 status is Open. Original cause of wound was Gradually Appeared. The wound is located on the Right Toe Second. The wound measures 0.2cm length x 0.2cm width x 0.1cm depth; 0.031cm^2 area and 0.003cm^3  volume. There is Fat Layer (Subcutaneous Tissue) Exposed exposed. There is no tunneling or undermining noted. There is a large amount of serous drainage noted. The wound margin is flat and intact. There is large (67-100%) pink granulation within the wound bed. There is no necrotic tissue within the wound bed. Wound #20 status is Open. Original cause of wound was Pressure Injury. The wound is located on the Left Toe Second. The wound measures 0.5cm length x 0.7cm width x 0.1cm depth; 0.275cm^2 area and 0.027cm^3 volume. There is Fat Layer (Subcutaneous Tissue) Exposed exposed. There is no tunneling or undermining noted. There is a medium amount of serous drainage noted. The wound margin is flat and intact. There is large (67-100%) pink granulation within the wound bed. There is no necrotic tissue within the wound bed. Wound #21 status is Open. Original cause of wound was Shear/Friction. The wound is located on the Left Toe Great. The wound measures 0.1cm length x 0.1cm width x 0.1cm depth; 0.008cm^2 area and 0.001cm^3 volume. The wound is limited to skin breakdown. There is no tunneling or undermining noted. There is a small amount of serous drainage noted. The wound margin is flat and intact. There is large (67-100%) pink granulation within the wound bed. There is no necrotic tissue within the wound bed. Wound #22 status is Open. Original cause of wound was Gradually Appeared. The wound is located on the Ebro, [name] ([id number]) Right,Distal,Dorsal Foot. The wound measures 0.1cm length x 0.1cm width x 0.1cm depth; 0.008cm^2 area and 0.001cm^3 volume. The wound is limited to skin breakdown. There is no tunneling or undermining noted. There is a large amount of serous drainage noted. The wound margin is flat and intact. There is large (67-100%) pink granulation within the wound bed. There is no necrotic tissue within the wound bed. 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 Wound #18 Right,Dorsal Toe Great: Clean wound with Normal Saline. Wound #2 Right Toe Second: Clean wound with Normal Saline. Wound #20 Left Toe Second: Clean wound with Normal Saline. Wound #21 Left Toe Great: Clean wound with Normal Saline. Wound #22 Right,Distal,Dorsal Foot: Clean wound with Normal Saline. Anesthetic (add to Medication List): Wound #16 Right,Plantar Toe Great: Topical Lidocaine 4% cream applied to wound bed prior to debridement (In Clinic Only). Wound #18 Right,Dorsal Toe Great: Topical Lidocaine 4% cream applied to wound bed prior to debridement (In Clinic Only). Wound #2 Right Toe Second: Topical Lidocaine 4% cream applied to wound bed prior to debridement (In Clinic Only). Wound #20 Left Toe Second: Topical Lidocaine 4% cream applied to wound bed prior to debridement (In Clinic Only). Wound #21 Left Toe Great: Topical Lidocaine 4% cream applied to wound bed prior to debridement (In Clinic Only). Wound #22 Right,Distal,Dorsal Foot: Topical Lidocaine 4% cream applied to wound bed prior to debridement (In Clinic Only). Skin Barriers/Peri-Wound Care: Wound #16 Right,Plantar Toe Great: Antifungal powder-Nystatin Spurr, Isaiahs E. ([id number]) Wound #18 Right,Dorsal Toe Great: Antifungal powder-Nystatin Wound #2 Right Toe Second: Antifungal powder-Nystatin Wound #20 Left Toe Second: Antifungal  powder-Nystatin Wound #21 Left Toe Great: Antifungal powder-Nystatin Wound #22 Right,Distal,Dorsal Foot: Antifungal powder-Nystatin Primary Wound Dressing: Wound #16 Right,Plantar Toe Great: Silver Alginate - secure with tape Wound #18 Right,Dorsal Toe Great: Silver Alginate - secure with tape Wound #2 Right Toe Second: Silver Alginate - secure with tape Wound #20 Left Toe Second: Silver Alginate - secure with tape Wound #21 Left Toe Great: Silver Alginate - secure with tape Wound #22 Right,Distal,Dorsal Foot: Silver Alginate - secure with tape Dressing Change Frequency: Wound #16 Right,Plantar Toe Great: Change dressing every other day. Wound #18 Right,Dorsal Toe Great: Change dressing every other day. Wound #2 Right Toe Second: Change dressing every other day. Wound #20 Left Toe Second: Change dressing every other day. Wound #21 Left Toe Great: Change dressing every other day. Wound #22 Right,Distal,Dorsal Foot: Change dressing every other day. Follow-up Appointments: Wound #16 Right,Plantar Toe Great: Return Appointment in 1 week. Wound #18 Right,Dorsal Toe Great: Return Appointment in 1 week. Wound #2 Right Toe Second: Return Appointment in 1 week. Wound #20 Left Toe Second: Return Appointment in 1 week. Wound #21 Left Toe Great: Return Appointment in 1 week. Wound #22 Right,Distal,Dorsal Foot: Return Appointment in 1 week. Edema Control: Wound #16 Right,Plantar Toe Great: Other: - double layer tubi grip F bilateral Additional Orders / Instructions: Wound #16 Right,Plantar Toe Great: Stop Smoking Increase protein intake. Wound #18 Right,Dorsal Toe Great: Stop Smoking Increase protein intake. Wound #2 Right Toe Second: Sager, Dameon E. ([id number]) Stop Smoking Increase protein intake. Wound #20 Left Toe Second: Stop Smoking Increase protein intake. Wound #21 Left Toe Great: Stop Smoking Increase protein intake. Wound #22 Right,Distal,Dorsal  Foot: Stop Smoking Increase protein intake. Medications-please add to medication list.: Wound #16 Right,Plantar Toe Great: Other: - Nystatin Powder Wound #18 Right,Dorsal Toe Great: Other: - Nystatin Powder Wound #2 Right Toe Second: Other: - Nystatin Powder Wound #20 Left Toe Second: Other: - Nystatin Powder Wound #21 Left Toe Great: Other: - Nystatin Powder Wound #22 Right,Distal,Dorsal Foot: Other: - Nystatin Powder 1. I would recommend that we continue with the nystatin powder this seems to keep things dry and helps out with the fungal infection. 2. I am going to suggest as well that he continue with the silver alginate dressing which again also plays a role in keeping things somewhat dry  for him. 3. I think that he needs to really have compression although he tells me the compression socks he has currently are too much for him and cause trouble. For that reason he has been using Tubigrip I still feel like this has done well and he actually appears to be doing better than he was last week. 4. With regard to his arterial studies that we had ordered and got scheduled for him he did not go to them this morning there appears to been a lot of confusion. Nonetheless I recommended giving the patient the number to get in touch with the office there and vascular for him to get this rescheduled. We will see patient back for reevaluation in 1 week here in the clinic. If anything worsens or changes patient will contact our office for additional recommendations. Electronic Signature(s) Signed: 11/27/2018 11:10:48 AM By: Worthy Keeler PA-C Entered By: Worthy Keeler on 11/27/2018 11:10:48 Nester, [name] ([id number]) -------------------------------------------------------------------------------- ROS/PFSH Details Patient Name: Mckiernan, Elster E. Date of Service: 11/27/2018 9:30 AM Medical Record Number: [id number] Patient Account Number: [id number] Date of Birth/Sex: 10-Mar-1951 (68 y.o.  M) Treating RN: Army Melia Primary Care Provider: Lamonte Sakai Other Clinician: Referring Provider: Lamonte Sakai Treating Provider/Extender: Melburn Hake, Maryrose Colvin Weeks in Treatment: 113 Information Obtained From Patient Constitutional Symptoms (General Health) Complaints and Symptoms: Negative for: Fatigue; Fever; Chills; Marked Weight Change Respiratory Complaints and Symptoms: Negative for: Chronic or frequent coughs; Shortness of Breath Medical History: Negative for: Aspiration; Asthma; Chronic Obstructive Pulmonary Disease (COPD); Pneumothorax; Sleep Apnea; Tuberculosis Cardiovascular Complaints and Symptoms: Positive for: LE edema Negative for: Chest pain Medical History: Positive for: Congestive Heart Failure; Hypertension; Peripheral Venous Disease Negative for: Angina; Arrhythmia; Coronary Artery Disease; Deep Vein Thrombosis; Hypotension; Myocardial Infarction; Peripheral Arterial Disease; Phlebitis; Vasculitis Neurologic Complaints and Symptoms: Negative for: Numbness/parasthesias; Focal/Weakness Medical History: Positive for: Neuropathy Negative for: Dementia; Quadriplegia; Paraplegia; Seizure Disorder Psychiatric Complaints and Symptoms: Negative for: Anxiety; Claustrophobia Eyes Medical History: Negative for: Cataracts; Glaucoma; Optic Neuritis Ear/Nose/Mouth/Throat SAIGE, BUSBY ([id number]) Medical History: Negative for: Chronic sinus problems/congestion; Middle ear problems Hematologic/Lymphatic Medical History: Positive for: Anemia; Lymphedema Negative for: Hemophilia; Human Immunodeficiency Virus; Sickle Cell Disease Gastrointestinal Medical History: Negative for: Cirrhosis ; Colitis; Crohnos; Hepatitis A; Hepatitis B; Hepatitis C Endocrine Medical History: Positive for: Type II Diabetes Treated with: Oral agents Blood sugar tested every day: Yes Tested : QD Genitourinary Medical History: Negative for: End Stage Renal  Disease Immunological Medical History: Negative for: Lupus Erythematosus; Raynaudos; Scleroderma Integumentary (Skin) Medical History: Negative for: History of Burn; History of pressure wounds Musculoskeletal Medical History: Negative for: Gout; Rheumatoid Arthritis; Osteoarthritis; Osteomyelitis Oncologic Medical History: Past Medical History Notes: 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 ([id number]) Current every day smoker; Marital Status - Widowed; Alcohol Use: Daily - quit drinking about a week ago; Drug Use: No History; Caffeine Use: Moderate; Financial Concerns: No; Food, Clothing or Shelter Needs: No; Support System Lacking: No; Transportation Concerns: No Physician Affirmation I have reviewed and agree with the above information. Electronic Signature(s) Signed: 11/27/2018 11:51:11 AM By: Army Melia Signed: 11/27/2018 5:39:21 PM By: Worthy Keeler PA-C Entered By: Worthy Keeler on 11/27/2018 11:09:41 Giovannetti, [name] ([id number]) -------------------------------------------------------------------------------- SuperBill Details Patient Name: Scheibel, Avyay E. Date of Service: 11/27/2018 Medical Record Number: [id number] Patient Account Number: [id number] Date of Birth/Sex:  08-07-1950 (68 y.o. 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 of skin E11.621 Type 2 diabetes mellitus with foot ulcer I87.323 Chronic  venous hypertension (idiopathic) with inflammation of bilateral lower extremity I89.0 Lymphedema, not elsewhere classified F17.218 Nicotine dependence, cigarettes, 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 abuse 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, right ankle and foot Physician Procedures CPT4 Code Description: [id number] - WC PHYS LEVEL 4 - EST PT ICD-10 Diagnosis Description L97.521 Non-pressure chronic ulcer of other part of left foot limited to L97.511 Non-pressure chronic ulcer of other part of right foot limited t E11.621 Type 2  diabetes mellitus with foot ulcer I87.323 Chronic venous hypertension (idiopathic) with inflammation of bi Modifier: breakdown of s o breakdown of lateral lower e Quantity: 1 kin skin xtremity Electronic Signature(s) Signed: 11/27/2018 11:11:08 AM By: Worthy Keeler PA-C Entered By: Worthy Keeler on 11/27/2018 11:11:08

## 2018-12-04 ENCOUNTER — Ambulatory Visit: Payer: Medicare Other | Admitting: Physician Assistant

## 2018-12-05 ENCOUNTER — Other Ambulatory Visit: Payer: Self-pay

## 2018-12-05 ENCOUNTER — Telehealth (INDEPENDENT_AMBULATORY_CARE_PROVIDER_SITE_OTHER): Payer: Self-pay | Admitting: Vascular Surgery

## 2018-12-05 ENCOUNTER — Encounter: Payer: Medicare Other | Admitting: Physician Assistant

## 2018-12-05 DIAGNOSIS — E11621 Type 2 diabetes mellitus with foot ulcer: Secondary | ICD-10-CM | POA: Diagnosis not present

## 2018-12-05 NOTE — Telephone Encounter (Signed)
Patient does not need an ultrasound. See notes below per Eulogio Ditch NP

## 2018-12-05 NOTE — Telephone Encounter (Signed)
APPT MADE FOR PATIENT

## 2018-12-05 NOTE — Telephone Encounter (Signed)
Based on the records the patient has seen [facility] in the past, he can be seen by me or [facility]

## 2018-12-05 NOTE — Progress Notes (Signed)
MARILYN, WING ([id number]) Visit Report for 12/05/2018 Arrival Information Details Patient Name: [name], [name] Date of Service: 12/05/2018 8:15 AM Medical Record Number: [id number] Patient Account Number: [id number] Date of Birth/Sex: 22-Sep-1950 (68 y.o. M) Treating RN: Montey Hora Primary Care Mikylah Ackroyd: Lamonte Sakai Other Clinician: Referring Kataleyah Carducci: Lamonte Sakai Treating Kolbie Lepkowski/Extender: Melburn Hake, HOYT Weeks in Treatment: 35 Visit Information History Since Last Visit Added or deleted any medications: No Patient Arrived: Ambulatory Any new allergies or adverse reactions: No Arrival Time: 08:15 Had a fall or experienced change in No Accompanied By: self activities of daily living that may affect Transfer Assistance: None risk of falls: Patient Identification Verified: Yes Signs or symptoms of abuse/neglect since last visito No Secondary Verification Process Completed: Yes Hospitalized since last visit: No Patient Requires Transmission-Based No Implantable device outside of the clinic excluding No Precautions: cellular tissue based products placed in the center Patient Has Alerts: Yes since last visit: Patient Alerts: DMII Has Dressing in Place as Prescribed: Yes Pain Present Now: No Electronic Signature(s) Signed: 12/05/2018 11:16:37 AM By: Lorine Bears RCP, RRT, CHT Entered By: Becky Sax, Amado Nash on 12/05/2018 08:17:11 [name] ([id number]) -------------------------------------------------------------------------------- Clinic Level of Care Assessment Details Patient Name: [name], [name] Date of Service: 12/05/2018 8:15 AM Medical Record Number: [id number] Patient Account Number: [id number] Date of Birth/Sex: 09/05/1950 (68 y.o. M) Treating RN: Montey Hora Primary Care Travor Royce: Lamonte Sakai Other Clinician: Referring Bernetta Sutley: Lamonte Sakai Treating Cleaven Demario/Extender: Melburn Hake, HOYT Weeks in Treatment: 115 Clinic Level of  Care Assessment Items TOOL 4 Quantity Score []  - Use when only an EandM is performed on FOLLOW-UP visit 0 ASSESSMENTS - Nursing Assessment / Reassessment X - Reassessment of Co-morbidities (includes updates in patient status) 1 10 X- 1 5 Reassessment of Adherence to Treatment Plan ASSESSMENTS - Wound and Skin Assessment / Reassessment []  - Simple Wound Assessment / Reassessment - one wound 0 X- 6 5 Complex Wound Assessment / Reassessment - multiple wounds []  - 0 Dermatologic / Skin Assessment (not related to wound area) ASSESSMENTS - Focused Assessment []  - Circumferential Edema Measurements - multi extremities 0 []  - 0 Nutritional Assessment / Counseling / Intervention X- 1 5 Lower Extremity Assessment (monofilament, tuning fork, pulses) []  - 0 Peripheral Arterial Disease Assessment (using hand held doppler) ASSESSMENTS - Ostomy and/or Continence Assessment and Care []  - Incontinence Assessment and Management 0 []  - 0 Ostomy Care Assessment and Management (repouching, etc.) PROCESS - Coordination of Care X - Simple Patient / Family Education for ongoing care 1 15 []  - 0 Complex (extensive) Patient / Family Education for ongoing care X- 1 10 Staff obtains Programmer, systems, Records, Test Results / Process Orders []  - 0 Staff telephones HHA, Nursing Homes / Clarify orders / etc []  - 0 Routine Transfer to another Facility (non-emergent condition) []  - 0 Routine Hospital Admission (non-emergent condition) []  - 0 New Admissions / Biomedical engineer / Ordering NPWT, Apligraf, etc. []  - 0 Emergency Hospital Admission (emergent condition) X- 1 10 Simple Discharge Coordination [name], [name] ([id number]) []  - 0 Complex (extensive) Discharge Coordination PROCESS - Special Needs []  - Pediatric / Minor Patient Management 0 []  - 0 Isolation Patient Management []  - 0 Hearing / Language / Visual special needs []  - 0 Assessment of Community assistance (transportation, D/C planning,  etc.) []  - 0 Additional assistance / Altered mentation []  - 0 Support Surface(s) Assessment (bed, cushion, seat, etc.) INTERVENTIONS - Wound Cleansing / Measurement []  - Simple Wound Cleansing - one wound  0 X- 6 5 Complex Wound Cleansing - multiple wounds X- 1 5 Wound Imaging (photographs - any number of wounds) []  - 0 Wound Tracing (instead of photographs) []  - 0 Simple Wound Measurement - one wound X- 6 5 Complex Wound Measurement - multiple wounds INTERVENTIONS - Wound Dressings X - Small Wound Dressing one or multiple wounds 6 10 []  - 0 Medium Wound Dressing one or multiple wounds []  - 0 Large Wound Dressing one or multiple wounds X- 1 5 Application of Medications - topical []  - 0 Application of Medications - injection INTERVENTIONS - Miscellaneous []  - External ear exam 0 []  - 0 Specimen Collection (cultures, biopsies, blood, body fluids, etc.) []  - 0 Specimen(s) / Culture(s) sent or taken to Lab for analysis []  - 0 Patient Transfer (multiple staff / Civil Service fast streamer / Similar devices) []  - 0 Simple Staple / Suture removal (25 or less) []  - 0 Complex Staple / Suture removal (26 or more) []  - 0 Hypo / Hyperglycemic Management (close monitor of Blood Glucose) []  - 0 Ankle / Brachial Index (ABI) - do not check if billed separately X- 1 5 Vital Signs [name], [name] ([id number]) Has the patient been seen at the hospital within the last three years: Yes Total Score: 220 Level Of Care: New/Established - Level 5 Electronic Signature(s) Signed: 12/05/2018 1:01:55 PM By: Montey Hora Entered By: Montey Hora on 12/05/2018 09:16:25 [name] ([id number]) -------------------------------------------------------------------------------- Encounter Discharge Information Details Patient Name: [name], [name] Date of Service: 12/05/2018 8:15 AM Medical Record Number: [id number] Patient Account Number: [id number] Date of Birth/Sex: 1950-06-15 (68 y.o. M) Treating RN:  Montey Hora Primary Care Fedora Knisely: Lamonte Sakai Other Clinician: Referring Noreen Mackintosh: Lamonte Sakai Treating Prabhjot Piscitello/Extender: Melburn Hake, HOYT Weeks in Treatment: 115 Encounter Discharge Information Items Discharge Condition: Stable Ambulatory Status: Ambulatory Discharge Destination: Home Transportation: Private Auto Accompanied By: self Schedule Follow-up Appointment: Yes Clinical Summary of Care: Electronic Signature(s) Signed: 12/05/2018 1:01:55 PM By: Montey Hora Entered By: Montey Hora on 12/05/2018 09:21:07 Tamburri, Mayan EMarland Kitchen ([id number]) -------------------------------------------------------------------------------- Lower Extremity Assessment Details Patient Name: Oshel, Ruven E. Date of Service: 12/05/2018 8:15 AM Medical Record Number: [id number] Patient Account Number: [id number] Date of Birth/Sex: [birth year] (68 y.o. M) Treating RN: Army Melia Primary Care Remington Highbaugh: Lamonte Sakai Other Clinician: Referring Juma Oxley: Lamonte Sakai Treating Troy Hartzog/Extender: Melburn Hake, HOYT Weeks in Treatment: 115 Edema Assessment Assessed: [Left: No] [Right: No] Edema: [Left: No] [Right: No] Vascular Assessment Pulses: Dorsalis Pedis Palpable: [Left:Yes] [Right:Yes] Electronic Signature(s) Signed: 12/05/2018 9:37:06 AM By: Army Melia Entered By: Army Melia on 12/05/2018 08:23:47 Hirst, Lleyton E. ([id number]) -------------------------------------------------------------------------------- Multi Wound Chart Details Patient Name: Alfredo, Kenshin E. Date of Service: 12/05/2018 8:15 AM Medical Record Number: [id number] Patient Account Number: [id number] Date of Birth/Sex: [birth year] (68 y.o. M) Treating RN: Montey Hora Primary Care Heena Woodbury: Lamonte Sakai Other Clinician: Referring Pernie Grosso: Lamonte Sakai Treating Arlyce Circle/Extender: Melburn Hake, HOYT Weeks in Treatment: 115 Vital Signs Height(in): 36 Pulse(bpm): 6 Weight(lbs): 168 Blood Pressure(mmHg): 163/72 Body Mass  Index(BMI): 25 Temperature(F): 98.8 Respiratory Rate 18 (breaths/min): Photos: Wound Location: Right Toe Great - Plantar Right Toe Great - Dorsal Right Toe Second Wounding Event: Not Known Gradually Appeared Gradually Appeared Primary Etiology: Diabetic Wound/Ulcer of 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 Lower Extremity Lower Extremity Comorbid History: Anemia, Lymphedema, Anemia, Lymphedema, Anemia, Lymphedema, Congestive Heart Failure, Congestive Heart Failure, Congestive Heart Failure, Hypertension, Peripheral Hypertension, Peripheral Hypertension, Peripheral Venous Disease, Type II Venous Disease, Type II Venous Disease, Type II  Diabetes, Neuropathy Diabetes, Neuropathy Diabetes, Neuropathy Date Acquired: 10/29/2017 05/22/2018 06/11/2016 Weeks of Treatment: 48 28 115 Wound Status: Open Open Open Pending Amputation on No No Yes Presentation: Measurements L x W x D 1.5x2x0.1 2x3x0.1 2.5x3x0.1 (cm) Area (cm) : 2.356 4.712 5.89 Volume (cm) : 0.236 0.471 0.589 % Reduction in Area: -47.10% -488.30% -91.80% % Reduction in Volume: -47.50% -488.70% -91.90% Classification: Grade 1 Grade 1 Grade 1 Exudate Amount: Large Large Large Exudate Type: Serous Serous Serous Exudate Color: amber amber amber Wound Margin: Flat and Intact Flat and Intact Flat and Intact Granulation Amount: Small (1-33%) Small (1-33%) Small (1-33%) Granulation Quality: Pink Pale Pink Necrotic Amount: Large (67-100%) Large (67-100%) Large (67-100%) Necrotic Tissue: Eschar, Adherent [name], Adherent [name], [place] ([id number]) Exposed Structures: Fat Layer (Subcutaneous Fat Layer (Subcutaneous Fat Layer (Subcutaneous Tissue) Exposed: Yes Tissue) Exposed: Yes Tissue) Exposed: Yes Epithelialization: Small (1-33%) Large (67-100%) Medium (34-66%) Wound Number: 20 21 22  Photos: Wound Location: Left Toe Second Left Toe Great Right Foot -  Dorsal, Distal Wounding Event: Pressure Injury Shear/Friction Gradually Appeared Primary Etiology: Diabetic Wound/Ulcer of 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 Lower Extremity Lower Extremity Comorbid History: Anemia, Lymphedema, Anemia, Lymphedema, Anemia, Lymphedema, Congestive Heart Failure, Congestive Heart Failure, Congestive Heart Failure, Hypertension, Peripheral Hypertension, Peripheral Hypertension, Peripheral Venous Disease, Type II Venous Disease, Type II Venous Disease, Type II Diabetes, Neuropathy Diabetes, Neuropathy Diabetes, Neuropathy Date Acquired: 09/22/2018 10/13/2018 10/13/2018 Weeks of Treatment: 10 7 7  Wound Status: Open Open Open Pending Amputation on No No No Presentation: Measurements L x W x D 2x2.5x0.1 4x2x0.1 1.5x1.8x0.1 (cm) Area (cm) : 3.927 6.283 2.121 Volume (cm) : 0.393 0.628 0.212 % Reduction in Area: 0.00% -60.00% 80.00% % Reduction in Volume: 0.00% -59.80% 80.00% Classification: Grade 1 Grade 1 Grade 1 Exudate Amount: Medium Small Large Exudate Type: Serous Serous Serous Exudate Color: amber amber amber Wound Margin: Flat and Intact Flat and Intact Flat and Intact Granulation Amount: Medium (34-66%) Small (1-33%) Medium (34-66%) Granulation Quality: Pink Pink Pink Necrotic Amount: Medium (34-66%) Large (67-100%) Medium (34-66%) Necrotic Tissue: Eschar, Adherent Slough Eschar, Adherent [place] Exposed Structures: Fat Layer (Subcutaneous Fascia: No Fascia: No Tissue) Exposed: Yes Fat Layer (Subcutaneous Fat Layer (Subcutaneous Tissue) Exposed: No Tissue) Exposed: No Tendon: No Tendon: No Muscle: No Muscle: No Joint: No Joint: No Bone: No Bone: No Limited to Skin Breakdown Limited to Skin Breakdown Epithelialization: Medium (34-66%) Large (67-100%) Large (67-100%) Treatment Notes Electronic Signature(s) Signed: 12/05/2018 1:01:55 PM By: Enid Derry  ([id number]) Entered By: Montey Hora on 12/05/2018 08:41:46 [name], [name] ([id number]) -------------------------------------------------------------------------------- [place] Details Patient Name: [name], [name] Date of Service: 12/05/2018 8:15 AM Medical Record Number: [id number] Patient Account Number: [id number] Date of Birth/Sex: [birth year] (68 y.o. M) Treating RN: Montey Hora Primary Care Altariq Goodall: Lamonte Sakai Other Clinician: Referring Fiana Gladu: Lamonte Sakai Treating Virna Livengood/Extender: Melburn Hake, HOYT Weeks in Treatment: 33 Active Inactive Abuse / Safety / Falls / Self Care Management Nursing Diagnoses: Potential for falls Goals: Patient will remain injury free related to falls Date Initiated: 09/21/2016 Target Resolution Date: 09/14/2017 Goal Status: Active Interventions: Assess fall risk on admission and as needed Notes: Nutrition Nursing Diagnoses: Potential for alteratiion in Nutrition/Potential for imbalanced nutrition Goals: Patient/caregiver agrees to and verbalizes understanding of need to use nutritional supplements and/or vitamins as prescribed Date Initiated: 09/21/2016 Target Resolution Date: 09/14/2017 Goal Status: Active Interventions: Assess patient nutrition upon admission and  as needed per policy Notes: Orientation to the Wound Care Program Nursing Diagnoses: Knowledge deficit related to the wound healing center program Goals: Patient/caregiver will verbalize understanding of the [place] Date Initiated: 09/21/2016 Target Resolution Date: 06/15/2017 Goal Status: Active Interventions: Provide education on orientation to the wound center [name], [name] ([id number]) Notes: Wound/Skin Impairment Nursing Diagnoses: Knowledge deficit related to smoking impact on wound healing Goals: Ulcer/skin breakdown will have a volume reduction of 30% by week 4 Date Initiated: 09/21/2016 Target Resolution Date:  08/17/2017 Goal Status: Active Ulcer/skin breakdown will have a volume reduction of 50% by week 8 Date Initiated: 09/21/2016 Target Resolution Date: 08/17/2017 Goal Status: Active Ulcer/skin breakdown will have a volume reduction of 80% by week 12 Date Initiated: 09/21/2016 Target Resolution Date: 09/14/2017 Goal Status: Active Ulcer/skin breakdown will heal within 14 weeks Date Initiated: 09/21/2016 Target Resolution Date: 08/17/2017 Goal Status: Active Interventions: Assess patient/caregiver ability to obtain necessary supplies Assess patient/caregiver ability to perform ulcer/skin care regimen upon admission and as needed Assess ulceration(s) every visit Notes: Electronic Signature(s) Signed: 12/05/2018 1:01:55 PM By: Montey Hora Entered By: Montey Hora on 12/05/2018 08:41:38 Capetillo, Jermayne E. ([id number]) -------------------------------------------------------------------------------- Pain Assessment Details Patient Name: [name], [name] Date of Service: 12/05/2018 8:15 AM Medical Record Number: [id number] Patient Account Number: [id number] Date of Birth/Sex: Mar 06, 1951 (68 y.o. M) Treating RN: Montey Hora Primary Care Ashly Yepez: Lamonte Sakai Other Clinician: Referring Fread Kottke: Lamonte Sakai Treating Brinlyn Cena/Extender: Melburn Hake, HOYT Weeks in Treatment: 115 Active Problems Location of Pain Severity and Description of Pain Patient Has Paino No Site Locations Pain Management and Medication Current Pain Management: Electronic Signature(s) Signed: 12/05/2018 11:16:37 AM By: Lorine Bears RCP, RRT, CHT Signed: 12/05/2018 1:01:55 PM By: Montey Hora Entered By: Lorine Bears on 12/05/2018 08:17:19 Flaim, [name] ([id number]) -------------------------------------------------------------------------------- Patient/Caregiver Education Details Patient Name: Earnshaw, Kadien E. Date of Service: 12/05/2018 8:15 AM Medical Record Number: [id number] Patient  Account Number: [id number] Date of Birth/Gender: [birth year] (68 y.o. M) Treating RN: Montey Hora Primary Care Physician: Lamonte Sakai Other Clinician: Referring Physician: Lamonte Sakai Treating Physician/Extender: Sharalyn Ink in Treatment: 115 Education Assessment Education Provided To: Patient Education Topics Provided Wound/Skin Impairment: Handouts: Other: wound care as ordered Methods: Demonstration, Explain/Verbal Responses: State content correctly Electronic Signature(s) Signed: 12/05/2018 1:01:55 PM By: Montey Hora Entered By: Montey Hora on 12/05/2018 09:17:22 Pucillo, Hager E. ([id number]) -------------------------------------------------------------------------------- Wound Assessment Details Patient Name: [name], [name] Date of Service: 12/05/2018 8:15 AM Medical Record Number: [id number] Patient Account Number: [id number] Date of Birth/Sex: Apr 10, 1950 (68 y.o. M) Treating RN: Army Melia Primary Care Ahmir Bracken: Lamonte Sakai Other Clinician: Referring Trisa Cranor: Lamonte Sakai Treating Coolidge Gossard/Extender: Melburn Hake, HOYT Weeks in Treatment: 115 Wound Status Wound Number: 16 Primary Diabetic Wound/Ulcer of the Lower Extremity Etiology: Wound Location: Right Toe Great - Plantar Wound Open Wounding Event: Not Known Status: Date Acquired: 10/29/2017 Comorbid Anemia, Lymphedema, Congestive Heart Weeks Of Treatment: 57 History: Failure, Hypertension, Peripheral Venous Clustered Wound: No Disease, Type II Diabetes, Neuropathy Photos Wound Measurements Length: (cm) 1.5 Width: (cm) 2 Depth: (cm) 0.1 Area: (cm) 2.356 Volume: (cm) 0.236 % Reduction in Area: -47.1% % Reduction in Volume: -47.5% Epithelialization: Small (1-33%) Tunneling: No Undermining: No Wound Description Classification: Grade 1 Foul Odor Wound Margin: Flat and Intact Slough/Fib Exudate Amount: Large Exudate Type: Serous Exudate Color: amber After Cleansing: No rino No Wound  Bed Granulation Amount: Small (1-33%) Exposed Structure Granulation Quality: Pink Fat Layer (Subcutaneous Tissue) Exposed: Yes Necrotic Amount: Large (67-100%) Necrotic  Quality: Eschar, Adherent Slough Treatment Notes Wound #16 (Right, Plantar Toe Great) Notes Matton, Rylynn E. ([id number]) nystatin powder, silver cell, tape Electronic Signature(s) Signed: 12/05/2018 9:37:06 AM By: Army Melia Entered By: Army Melia on 12/05/2018 08:20:17 Parthasarathy, [name] ([id number]) -------------------------------------------------------------------------------- Wound Assessment Details Patient Name: [name], [name] Date of Service: 12/05/2018 8:15 AM Medical Record Number: [id number] Patient Account Number: [id number] Date of Birth/Sex: 1950/12/09 (68 y.o. M) Treating RN: Army Melia Primary Care Syrena Burges: Lamonte Sakai Other Clinician: Referring Calley Drenning: Lamonte Sakai Treating Gregorio Worley/Extender: Melburn Hake, HOYT Weeks in Treatment: 115 Wound Status Wound Number: 18 Primary Diabetic Wound/Ulcer of the Lower Extremity Etiology: Wound Location: Right Toe Great - Dorsal Wound Open Wounding Event: Gradually Appeared Status: Date Acquired: 05/22/2018 Comorbid Anemia, Lymphedema, Congestive Heart Weeks Of Treatment: 28 History: Failure, Hypertension, Peripheral Venous Clustered Wound: No Disease, Type II Diabetes, Neuropathy Photos Wound Measurements Length: (cm) 2 Width: (cm) 3 Depth: (cm) 0.1 Area: (cm) 4.712 Volume: (cm) 0.471 % Reduction in Area: -488.3% % Reduction in Volume: -488.7% Epithelialization: Large (67-100%) Tunneling: No Undermining: No Wound Description Classification: Grade 1 Foul Odor A Wound Margin: Flat and Intact Slough/Fibr Exudate Amount: Large Exudate Type: Serous Exudate Color: amber fter Cleansing: No ino No Wound Bed Granulation Amount: Small (1-33%) Exposed Structure Granulation Quality: Pale Fat Layer (Subcutaneous Tissue) Exposed: Yes Necrotic  Amount: Large (67-100%) Necrotic Quality: Eschar, Adherent Sl[place] Treatment Notes Wound #18 (Right, Dorsal Toe Great) Notes Snowberger, Catherine E. ([id number]) nystatin powder, silver cell, tape Electronic Signature(s) Signed: 12/05/2018 9:37:06 AM By: Army Melia Entered By: Army Melia on 12/05/2018 08:20:54 Hambly, Yannick E. ([id number]) -------------------------------------------------------------------------------- Wound Assessment Details Patient Name: [name], [name] Date of Service: 12/05/2018 8:15 AM Medical Record Number: [id number] Patient Account Number: [id number] Date of Birth/Sex: Apr 08, 1951 (68 y.o. M) Treating RN: Army Melia Primary Care Yatzil Clippinger: Lamonte Sakai Other Clinician: Referring Montrell Cessna: Lamonte Sakai Treating Arlanda Shiplett/Extender: Melburn Hake, HOYT Weeks in Treatment: 115 Wound Status Wound Number: 2 Primary Diabetic Wound/Ulcer of the Lower Extremity Etiology: Wound Location: Right Toe Second Wound Open Wounding Event: Gradually Appeared Status: Date Acquired: 06/11/2016 Comorbid Anemia, Lymphedema, Congestive Heart Weeks Of Treatment: 115 History: Failure, Hypertension, Peripheral Venous Clustered Wound: No Disease, Type II Diabetes, Neuropathy Pending Amputation On Presentation Photos Wound Measurements Length: (cm) 2.5 Width: (cm) 3 Depth: (cm) 0.1 Area: (cm) 5.89 Volume: (cm) 0.589 % Reduction in Area: -91.8% % Reduction in Volume: -91.9% Epithelialization: Medium (34-66%) Tunneling: No Undermining: No Wound Description Classification: Grade 1 Foul Odor Af Wound Margin: Flat and Intact Slough/Fibri Exudate Amount: Large Exudate Type: Serous Exudate Color: amber ter Cleansing: No no No Wound Bed Granulation Amount: Small (1-33%) Exposed Structure Granulation Quality: Pink Fat Layer (Subcutaneous Tissue) Exposed: Yes Necrotic Amount: Large (67-100%) Necrotic Quality: Eschar, Adherent Sl[place] Treatment Notes Wound #2 (Right Toe  Second) Notes Winship, Tayden E. ([id number]) nystatin powder, silver cell, tape Electronic Signature(s) Signed: 12/05/2018 9:37:06 AM By: Army Melia Entered By: Army Melia on 12/05/2018 08:21:27 Bechtold, Jaydan E. ([id number]) -------------------------------------------------------------------------------- Wound Assessment Details Patient Name: Rago, [name] Date of Service: 12/05/2018 8:15 AM Medical Record Number: [id number] Patient Account Number: [id number] Date of Birth/Sex: [birth year] (68 y.o. M) Treating RN: Army Melia Primary Care Wileen Duncanson: Lamonte Sakai Other Clinician: Referring Goldie Tregoning: Lamonte Sakai Treating Royanne Warshaw/Extender: Melburn Hake, HOYT Weeks in Treatment: 115 Wound Status Wound Number: 20 Primary Diabetic Wound/Ulcer of the Lower Extremity Etiology: Wound Location: Left Toe Second Wound Open Wounding Event: Pressure Injury Status: Date Acquired: 09/22/2018 Comorbid Anemia, Lymphedema, Congestive Heart Weeks Of  Treatment: 10 History: Failure, Hypertension, Peripheral Venous Clustered Wound: No Disease, Type II Diabetes, Neuropathy Photos Wound Measurements Length: (cm) 2 Width: (cm) 2.5 Depth: (cm) 0.1 Area: (cm) 3.927 Volume: (cm) 0.393 % Reduction in Area: 0% % Reduction in Volume: 0% Epithelialization: Medium (34-66%) Tunneling: No Wound Description Classification: Grade 1 Foul Odor Af Wound Margin: Flat and Intact Slough/Fibri Exudate Amount: Medium Exudate Type: Serous Exudate Color: amber ter Cleansing: No no No Wound Bed Granulation Amount: Medium (34-66%) Exposed Structure Granulation Quality: Pink Fat Layer (Subcutaneous Tissue) Exposed: Yes Necrotic Amount: Medium (34-66%) Necrotic Quality: Eschar, Adherent Slough Treatment Notes Wound #20 (Left Toe Second) Notes Krizan, Charlee E. ([id number]) nystatin powder, silver cell, tape Electronic Signature(s) Signed: 12/05/2018 9:37:06 AM By: Army Melia Entered By: Army Melia on  12/05/2018 08:22:46 Lipsett, Fabrizio E. ([id number]) -------------------------------------------------------------------------------- Wound Assessment Details Patient Name: Shiley, Eaden E. Date of Service: 12/05/2018 8:15 AM Medical Record Number: [id number] Patient Account Number: [id number] Date of Birth/Sex: 09/13/1950 (68 y.o. M) Treating RN: Army Melia Primary Care Britania Shreeve: Lamonte Sakai Other Clinician: Referring Aliegha Paullin: Lamonte Sakai Treating Raihana Balderrama/Extender: Melburn Hake, HOYT Weeks in Treatment: 115 Wound Status Wound Number: 21 Primary Diabetic Wound/Ulcer of the Lower Extremity Etiology: Wound Location: Left Toe Great Wound Open Wounding Event: Shear/Friction Status: Date Acquired: 10/13/2018 Comorbid Anemia, Lymphedema, Congestive Heart Weeks Of Treatment: 7 History: Failure, Hypertension, Peripheral Venous Clustered Wound: No Disease, Type II Diabetes, Neuropathy Photos Wound Measurements Length: (cm) 4 Width: (cm) 2 Depth: (cm) 0.1 Area: (cm) 6.283 Volume: (cm) 0.628 % Reduction in Area: -60% % Reduction in Volume: -59.8% Epithelialization: Large (67-100%) Tunneling: No Undermining: No Wound Description Classification: Grade 1 Foul Odor A Wound Margin: Flat and Intact Slough/Fibr Exudate Amount: Small Exudate Type: Serous Exudate Color: amber fter Cleansing: No ino No Wound Bed Granulation Amount: Small (1-33%) Exposed Structure Granulation Quality: Pink Fascia Exposed: No Necrotic Amount: Large (67-100%) Fat Layer (Subcutaneous Tissue) Exposed: No Necrotic Quality: Eschar, Adherent Slough Tendon Exposed: No Muscle Exposed: No Joint Exposed: No Bone Exposed: No Limited to Skin Breakdown Ewell, Kenechukwu E. ([id number]) Treatment Notes Wound #21 (Left Toe Great) Notes nystatin powder, silver cell, tape Electronic Signature(s) Signed: 12/05/2018 9:37:06 AM By: Army Melia Entered By: Army Melia on 12/05/2018 08:22:09 Rufener, Tareek E.  ([id number]) -------------------------------------------------------------------------------- Wound Assessment Details Patient Name: Hayner, Tarus E. Date of Service: 12/05/2018 8:15 AM Medical Record Number: [id number] Patient Account Number: [id number] Date of Birth/Sex: [birth year] (68 y.o. M) Treating RN: Army Melia Primary Care Bubber Rothert: Lamonte Sakai Other Clinician: Referring Dalaysia Harms: Lamonte Sakai Treating Salley Boxley/Extender: Melburn Hake, HOYT Weeks in Treatment: 115 Wound Status Wound Number: 22 Primary Diabetic Wound/Ulcer of the Lower Extremity Etiology: Wound Location: Right Foot - Dorsal, Distal Wound Open Wounding Event: Gradually Appeared Status: Date Acquired: 10/13/2018 Comorbid Anemia, Lymphedema, Congestive Heart Weeks Of Treatment: 7 History: Failure, Hypertension, Peripheral Venous Clustered Wound: No Disease, Type II Diabetes, Neuropathy Photos Wound Measurements Length: (cm) 1.5 Width: (cm) 1.8 Depth: (cm) 0.1 Area: (cm) 2.121 Volume: (cm) 0.212 % Reduction in Area: 80% % Reduction in Volume: 80% Epithelialization: Large (67-100%) Tunneling: No Undermining: No Wound Description Classification: Grade 1 Foul Odor Wound Margin: Flat and Intact Slough/Fib Exudate Amount: Large Exudate Type: Serous Exudate Color: amber After Cleansing: No rino No Wound Bed Granulation Amount: Medium (34-66%) Exposed Structure Granulation Quality: Pink Fascia Exposed: No Necrotic Amount: Medium (34-66%) Fat Layer (Subcutaneous Tissue) Exposed: No Necrotic Quality: Eschar, Adherent Slough Tendon Exposed: No Muscle Exposed: No Joint Exposed: No Bone Exposed: No Limited to  Skin Breakdown Lando, Faris E. ([id number]) Treatment Notes Wound #22 (Right, Distal, Dorsal Foot) Notes nystatin powder, silver cell, tape Electronic Signature(s) Signed: 12/05/2018 9:37:06 AM By: Army Melia Entered By: Army Melia on 12/05/2018 08:23:12 Herbst, Micharl E.  ([id number]) -------------------------------------------------------------------------------- Vitals Details Patient Name: Middlesworth, Perley E. Date of Service: 12/05/2018 8:15 AM Medical Record Number: [id number] Patient Account Number: [id number] Date of Birth/Sex: 05-Mar-1951 (68 y.o. M) Treating RN: Montey Hora Primary Care Dorsel Flinn: Lamonte Sakai Other Clinician: Referring Konstantine Gervasi: Lamonte Sakai Treating Temiloluwa Recchia/Extender: Melburn Hake, HOYT Weeks in Treatment: 115 Vital Signs Time Taken: 08:15 Temperature (F): 98.8 Height (in): 69 Pulse (bpm): 75 Weight (lbs): 168 Respiratory Rate (breaths/min): 18 Body Mass Index (BMI): 24.8 Blood Pressure (mmHg): 163/72 Reference Range: 80 - 120 mg / dl Electronic Signature(s) Signed: 12/05/2018 11:16:37 AM By: Lorine Bears RCP, RRT, CHT Entered By: Lorine Bears on 12/05/2018 08:17:46

## 2018-12-05 NOTE — Progress Notes (Addendum)
TEVION, LAFORGE ([id number]) Visit Report for 12/05/2018 Chief Complaint Document Details Patient Name: [name], [name] Date of Service: 12/05/2018 8:15 AM Medical Record Number: [id number] Patient Account Number: [id number] Date of Birth/Sex: [birth year] (68 y.o. M) Treating RN: Montey Hora Primary Care Provider: Lamonte Sakai Other Clinician: Referring Provider: Lamonte Sakai Treating Provider/Extender: Melburn Hake, HOYT Weeks in Treatment: 115 Information Obtained from: Patient Chief Complaint Patient presents for treatment of an open diabetic ulcer to both feet Electronic Signature(s) Signed: 12/05/2018 8:18:22 AM By: Worthy Keeler PA-C Entered By: Worthy Keeler on 12/05/2018 08:18:22 [name], [name] ([id number]) -------------------------------------------------------------------------------- HPI Details Patient Name: [name], [name] Date of Service: 12/05/2018 8:15 AM Medical Record Number: [id number] Patient Account Number: [id number] Date of Birth/Sex: [birth year] (68 y.o. M) Treating RN: Montey Hora Primary Care Provider: Lamonte Sakai Other Clinician: Referring Provider: Lamonte Sakai Treating Provider/Extender: Melburn Hake, HOYT Weeks in Treatment: 115 History of Present Illness HPI Description: 68 year old patient here to see as for bilateral feet ulceration to on his left first and second toe and 2 on his right first and second toe, which she's had for about 4 months. 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 Donella Stade. The patient is also undergoing workup by medical oncology for a intra-abdominal lymphadenopathy. 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 He is also status post appendectomy, inguinal lymph node biopsy, rectal biopsy  and rectal examination under anesthesia. he currently smokes cigarettes about half packet a day. In March of this year he was seen by Dr. Hortencia Pilar, for evaluation of bilateral varicose veins and besides wearing compression stockings he had recommended laser ablation of the right and left great saphenous veins to eleviate the symptoms and complications of severe superficial venous reflux disease. He also recommended lymphedema pumps for better control of his lymphedema. The patient recently has had on 08/23/2016, right greater saphenous vein ablation with the laser energy Earlier lower extremity venous reflux examination done on 05/08/2016 showed no DVT or SVT both lower legs but incompetence of bilateral great saphenous veins was present. A lower arterial study was also done and there was no significant right lower and left lower extremity problems based on a normal toe brachial index bilaterally and the ABI was 1.21 the left and 1.23 on the right. His post ablation venous duplex examination showed successful ablation of the right GS vein with thrombus formation 2 below the right saphenofemoral junction. The deep system was patent without evidence of thrombosis and this was done on 08/30/2016. 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 Addendum: regarding his x-rays done today and x-ray of the left foot -- IMPRESSION: No objective evidence of osteomyelitis. There are soft tissue changes which may reflect cellulitis. X-ray of the right foot -- IMPRESSION:Findings compatible with cellulitis of the toes. No objective evidence of osteomyelitis is observed. 10/01/16 on evaluation today patient's wounds appeared to be doing some better. I did review the x-rays as well which showed no evidence of osteomyelitis although there was evidence on x-ray of cellulitis. He fortunately is not  having any discomfort although he continues to have some swelling. He does not remember being on any antibiotics  recently. 10/15/16 on evaluation today patient's wounds overall appear to be doing better although he does have a new location noted on the left foot. Fortunately he is not having significant pain. It almost has the appearance that something is rubbing on the end of his toes but he wears the open toe shoes and according to what he is telling me never wears anything that would rub on his foot. There is no evidence of infection and specifically no evidence of a fungal infection 10/22/16 On evaluation today patient's wounds appeared to be doing better compared to last week in regard to his bilateral lower extremities. Fortunately I happy with how things are progressing although he still has ulcers I feel like that he is improving and appropriate manner. 11/12/16 on evaluation today patient appears to be doing well in regard to his bilateral feet and the respective wounds. We have been using surrounding her dressings along with an antifungal cream which seems to be doing very well. He has no bilateral dysfunction noticed that the rituals are weight loss at this point. He also has no nausea or vomiting a note purulent discharge. He did see Vein and vascular today and he tells me that they told him he could have surgery for his venous stasis but they did not feel like it was worth it in his words. Fortunately patient's wounds do appear to be getting sneakily better. [name], [name] ([id number]) 11/26/2016 -- he says he is going to have some surgery during this week at [facility] for possibly a colon resection. 12/31/2016 -- the patient has been noncompliant with his smoking and I'm not sure whether he is also started drinking again. He continues to be very nonchalant about his care 01/14/2017 -- the patient's HandP has been reviewed well and I understand he is being compliant with trying to give  up smoking and his local dressing changes. He does not have any surgical options of 4 to him by his vascular surgeons.he was last seen in early August by Dr. Hortencia Pilar who recommended compression stockings,and possibly lymph pumps in 2-3 months after doing a review ultrasound. 01/28/2017 - the patient did not have any fresh complaints but on examination I noted a large lacerated wound on the plantar aspect of his right fourth toe which had a lot of necrotic debris and it probes down to bone. 02/07/2017 -- x-ray of the right foot -- IMPRESSION: Soft tissue swelling about the first through fourth toes consistent with cellulitis. New destructive change in the tuft of the distal phalanx of the great toe is consistent with osteomyelitis. 02/14/2017 -- the patient's MRI is pending this coming Monday and he still continues to smoke. We have again gone over off loading of his wounds in great detail and he says he's been compliant. 02/21/2017 -- MR of the right foot -- IMPRESSION: 1. Soft tissue ulcer at the tip of the first, second and third toe knows. Cortical irregularity and bone marrow edema in the first distal phalanx most concerning for osteomyelitis. Mild marrow edema in the second and third distal phalanx without definite cortical destruction which may reflect early osteomyelitis versus reactive marrow edema. 2. Soft tissue edema surrounding the first phalanx most consistent with cellulitis. the patient was also recently evaluated by his medical oncologist Dr. 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 She is treating him with weekly Procrit. She is also keeping intra-abdominal lymphadenopathy and right lower  lobe lung nodule under observation. 04/04/2017 -- he was seen by Dr. Adrian Prows on 03/25/2017 -- after review he empirically put him on ciprofloxacin and doxycycline as they have good bone penetration and good  bioavailability and it will cover the usual pathogens and diabetic foot osteomyelitis. He will check inflammatory markers and plan a 50-49 week old records. C-reactive protein was 0.3 and the ESR was 72 04/18/17 on evaluation today patient appears to be doing about the same in regard to his lower extremity wounds bilaterally. He has continued to use the antifungal cream which does seem to be beneficial. Nonetheless the ulcers do seem to in some areas be epithelial eyes over and in other areas are still open. He is having no significant discomfort. 04/25/17-he is here in follow-up evaluation for multiple ulcerations to multiple toes bilaterally. He states he did see Dr. Ola Spurr again last week and continues antibiotic therapy. He is voicing no complaints or concerns, will continue with current treatment plan will possibility of adding compression therapy next week after an additional week of treatment/lotions to BLE prescribed by Dr Ola Spurr 05/02/17 he is here in follow up for for multiple ulcers to multiple toes bilaterally. we will stop using antifungal cream and will continue with silvercel and follow up next week 05/09/17-he is here in follow-up for multiple ulcerations to multiple toes bilaterally. There is improvement in appearance. He has not completely stopped using antifungal cream, but admits he has not using it between the toes. He has an appointment with Dr. Ola Spurr on 2/11, continues on doxycycline and Cipro. It has been 5 weeks of antibiotic therapy, we will order plain film xray to evaluate for osteomyelitis next week, prior to follow up with ID. Will continue with silvercel and follow up next week 05/16/17-he is here in follow-up evaluation for multiple ulcerations to multiple toes bilaterally and new wound to the right posterior heel. There is essentially no change in appearance, deteriorating measurements; he has a history of waxing and waning measurements. He admits that he  continues to apply moisturizer/cream/ointment to his toes despite weekly reminders to only apply silvercel to his toes. He states that he thinks the surgical shoe contributed to the superficial ulcer to his posterior heel, he is unable to articulate if this was an area of dry cracked skin as he has a similar area to the left heel. He now is wearing open toed slippers. He has an appointment with Dr. Ola Spurr on 2/11. We have ordered x-rays for her bilateral feet; he was advised to obtain the x-rays today or tomorrow. He will follow-up next week 05/23/17-he is here in follow-up evaluation for multiple ulcerations to multiple toes bilaterally and the right posterior heel. There is improvement in maceration. He has been compliant and not applying any moisturizing agent to his toes. He has been using Lac-Hydrin for his lower extremities with improvement. He did not go to his appointment on Monday with Dr. Ola Spurr secondary to financial concerns. X-rays for her bilateral feet showed: LEFT FOOT with slight erosion of the tuft of the distal phalanges of the left first and second toe suspicious for osteomyelitis, RIGHT FOOT with 1.erosion of the tufts of the distal [name], [name] ([id number]) phalanges of the right first second and possibly third toes consistent with osteomyelitis, 2 no definitive abnormality of the calcaneus is seen on the images obtained, 3. Plantar calcaneal degenerative spur. We briefly discussed hyperbaric adjunctive therapy for treatment of chronic refractory osteomyelitis. I do not find an a1c in  EMR, will contact PCP for record, or order if needed. He has been encouraged to contact Dr Ola Spurr office regarding the follow-up appointment, encouraged him to inquire about payment plan. We will continue with same treatment plan and follow-up next week. He states he is still taking antibiotics and has "a lot" left. He states he has been taking them as directed, 2 pills twice daily.  According to Dr. Blane Ohara office notes he was originally started on 12/17 for 4 weeks and extended on 1/14 for an additional 4 weeks. He should be done with his antibiotic therapy, he was advised to bring his bottles and to his next appointment, we will contact pharmacy. 05/30/17-he is here in follow-up evaluation for multiple ulcerations to multiple toes bilaterally and the right posterior heel. He is accompanied by his brother-in-law. Wounds are stable. He has yet to make up with Dr. Ola Spurr. We contacted his PCP, with no record of recent A1c we will draw an A1c. His brother-in-law states that he was taken off all of his diabetic medication secondary to kidney function. 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 The antibiotics were counted and have approximately 2 weeks left, although they should be complete. The pill organizer reveals missing days. We discussed the need for consistent medications, to have optimal benefit of medication. He has a cousin that lives with him and he will ask her to check his organizer daily. He has been advised to follow up with Dr Ola Spurr, and will go by the office today. He has been advised to quit smoking. 06/06/17-he is here in follow up evaluation. He has had to make an appointment with Dr. Ola Spurr. He did have blood work obtained, a1c 5. He continues to take antibiotic therapy. Significant improvement in bilateral lower extremity edema with compression therapy. Essentially no change in ulcerations to toes. He states he is "going to try something different" and "let me know next week" if it works; he would not provide any additional information and was encouraged to follow our orders. We will follow up next week 06/13/17-he is here in follow-up evaluation. He has an appointment with Dr. Ola Spurr tomorrow morning. He states he purchased an ointment from  [facility] and applied to his toes for 3 days, he does not remember the name of the ointment. There is improvement to his wounds, minimal maceration. He continues to take antibiotic therapy, this should have been completed last month. His brother-in-law who regularly accompanies his appointments was asked to take the bottles to the appointment tomorrow with Dr. Ola Spurr so he is aware. We will continue with 3 layer compression, and order OPEN TOE compression 20-30mmHg; we will apply compression stockings next week. He continues to smoke, smoked "2 cigarettes" last week 06/20/17 on evaluation today patient did receive his compression stockings which he has with him today for both lower extremities. With that being said he tells me at this point in time that he is very happy to have these he really is not a big fan of the compression wraps that we have been utilizing although they have been of great benefit for him. Nonetheless at this point he does want to switch to the compression stockings. In my opinion as long as he is continuing with compression I'm okay with the stockings or the wraps. 06/27/17-he is here in follow-up evaluation for multiple ulcerations to his bilateral toes. There is some improvement in appearance. He is compliant  in wearing his compression stockings with significant improvement in lower extremity edema. He saw Dr Ola Spurr on 3/8, per his notes they would redraw ESR and CRP; plan to continue antibiotic therapy if these remain elevated. I do not see an ESR or CRP level in Epic. The patient continues to take antibiotics. 07/11/17-He is here in follow-up evaluation for multiple ulcerations to multiple toes bilaterally. He presents with complete epithelialization to the right third toe; there has been no deterioration. He continues on antibiotic therapy. He will follow-up next week 07/18/17-He is here in follow-up evaluation for multiple ulcerations to multiple toes bilaterally. He  continues to make improvement. He continues on antibiotic therapy. He states he has been using something additional to our orders, he does not elaborate but states he will bring it in next week. 07/25/17-He is here in follow up evaluation for multiple ulcerations to bilateral toes. He is stable. He has completed antibiotic therapy. He admits to "filing" his toes after showers each evening, this is what he was referencing last week; he does not filing for the wounds. We will switch to [facility] and monitor for any improvement, he will follow-up next week 08/01/17-He is here in follow-up evaluation. He admits to "picking" at his toes after cleansing yesterday, leading to new areas of tissue loss on the bilateral second toe. There is improvement noted to the bilateral great toe. We will dress toes today and hope that they maintain until Monday where he will come in for a nurse visit. He has been advised, multiple times with expressed verbalization, to change the dressings to silvercel if the dressings get wet prior to [facility] appointment. 08/08/17-He is here in follow-up evaluation for bilateral first and second toe ulcerations. There is significant improvement to all ulcerations since last visit. We will switch to [name] to all wounds and he will continue with nurse visits on a Monday/Thursday schedule and follow-up with me in 2 weeks. He continues to smoke, 1-3 cigarettes per day, and has been encouraged to not smoke until his next follow-up in 2 weeks. 08/15/17 on evaluation today patient's ulcers on his toes actually appear to be doing fairly well the right to ulcers may be a little bit more moist compared to the left I'm not really sure exactly why as the openings appear to be very small and I'm not seeing any evidence of anything significant as far as you lightest or otherwise. Nonetheless this may just be a small setback he's [name], [name] ([id number]) been doing very well with the  [facility] Dressing 08/22/17-He is here in follow-up evaluation for ulcerations to his bilateral first and second toes, there is small/scant amount of drainage noted on today's dressing. He continues with Hydrofera Blue. He continues to smoke, 1 cigarette a day. Voices no complaint or concerns, compliant and compression therapy today. He'll follow-up next week 08/29/17-He is here in follow-up evaluation for ulcerations to his bilateral first and second toes, with a new wound to the left third toe. He states he cut himself while cutting the toenail. He presents today with more maceration and increased measurements; waxing and waning measurements and moisture has been an ongoing issue. He continues to smoke a proximally 1 cigarette per day, but states he has not smoked since Sunday. We will initiate medihoney daily and evaluate next week. He presents today without compression stockings 09/05/17-He is here in follow-up evaluation for ulcerations to bilateral first and second and left third toe. He did not pick up the  medihoney and therefore has not been changing his dressing. He admits to inconsistent where of compression stockings, admits to pain to his bilateral lower extremities with unilateral edema (right greater than left). The ulcerations to multiple toes are more macerated and larger in size than last week. He is wearing compression therapy today. He admits to an overall feeling of weakness and fatigue and has been encouraged to contact his PCP for evaluation; he has a known history of anemia and intraabdominal lymphadenopathy. We will return to silvercel and he will follow up next week 09/19/17-He is here in follow-up evaluation for ulcerations to bilateral first and second toe.he was unable to make last week's appointment. The culture that was obtained on 5/30 grew clindamycin sensitive MRSA; he was initiated on 6/4 but did not start it until 6/7. There is significant improvement in all  wounds, healing to left second and third toe. He also admits to having no alcohol or smoking for the last 6 days. We will continue with same treatment plan I will extend the clindamycin for an additional 10 days and he will follow-up next week. 09/26/17- He is here in follow evaluation for multiple ulcerations to multiple toes bilaterally. He continues to be non-compliant with dressing, compression therapy. He states he is taking the antibiotics as prescribed, although his recall is inconsistent. There is noted deterioration in all ulcers. We will continue same treatment plan, antibiotic therapy and he will follow up next week. 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 This information is obtained from the medical record, as he cannot articulate specifics to his diagnosis or treatment plan. 10/17/17-He is here in follow-up evaluation for multiple ulcerations to multiple toes bilaterally. There is chronic waxing and waning improvement/deterioration. On today's exam they appear improved, dry without maceration. He continues to apply a variety of topical agents, stating that he has switched between the blue product and silver product. In my opinion, this will be a chronic waxing and waning giving his intermittent compliance. He will follow-up in 2 weeks. As it relates to the biopsy taken from his buttock, he has not followed up with that and has not started radiation therapy. 10/31/17-He is here for evaluation for multiple ulcerations to multiple toes bilaterally. Continues with waxing and waning improvement versus deterioration. They are stable on today's exam without significant maceration. 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 We will continue with every 2 week follow-up appointments 11/14/17- He  is seen in follow-up evaluation for multiple ulcerations to multiple bilateral toes. He is scheduled for excision of basal cell carcinoma to his right buttock on Monday 8/12. He continues to have chronic waxing and waning of his ulcers; today is a stable day with minimal drainage and left ear is better than right toes. I will recount to infectious disease regarding chronic suppressive therapy. He admits to being more compliant with compression stockings, although he is not wearing any today. He has been encouraged to continue with dressing changes as ordered and compression therapy daily. He will be seen in 2 weeks 11/28/17-He is seen in follow up evaluation for multiple ulcerations to multiple toes bilaterally. He had an overnight admission at [facility] (8/11-8/12) for symptomatic anemia, received two units prbc. His surgical date has been moved to 9/94 excision of basal cell carcinoma to  the right buttock. He has been noncompliant in wearing compression stockings, applying topical treatment as ordered; he has been applying a "cream", Hydrofera Blue, silvercel. He is currently on no antibiotic therapy; we will send for re-referral to ID for suppressive therapy given his multiple co-morbidities, poor surgical candidate, unwilling to have toe amputation and general non-adherence. 12/19/17 on evaluation today patient actually appears to be doing very well in regard to his toes at least compared to last time I saw him. He again has been coming to our office for quite a bit of time. 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 He states is hurting a little bit but doing well in general. I'm see were not taking care of this area dermatology is. He does have his appointment with infectious disease upcoming which they re-consult for suppressive therapy that's next Monday. 01/02/18 on evaluation  today patient actually appears to be doing rather well in regard to his toe ulcers although he tells me he has been put in an ointment on this which I am concerned may be causing some additional moisture buildup which we really do not want. He supposed be using silver cell which is mainly thing I want him to be utilizing. Fortunately there does not appear to be any evidence of worsening infection which is good news. He has had surgery on his gluteal region where he had an excision of a cancerous area fortunately that seems to be doing well although he is having some discomfort. 02/06/18 on evaluation today patient actually appears to be doing well at all locations except for his second toe use bilaterally Nicklin, Aizik E. ([id number]) where the distal tip is still wrong open. He did state that he can't stand for dry skin to buildup on his toes subsequently as a question and further it sounds as if he actually pills this all which is likely keeping the toe is wrong and open. Also think that's what happened in the bottom of his foot although that appears to have healed at this point. There does not appear to be any evidence of infection. 02/20/18 on evaluation today patient appears to be doing better in regard to his foot ulcers. Everything has shown signs of improvement which is excellent news. Overall I'm very pleased with how things appear currently. The patient states he's not picking her point at any skin and that he wears shoes at all times when he is ambulating at home. Both are things that I've advocated and recommended for him in the past. 03/13/18 on evaluation today patient actually appears to be doing very well in regard to the bilateral toes in the overall appearance of the wound bed locations. Fortunately there does not appear to be any signs of infection at this time. He states that he is very pleased with how things seem to be progressing to be honest as am I. 03/27/1899 evaluation today  patient appears to be doing well in regard to his toe ulcers. He just has a few areas remaining and these appear to be showing signs of improvement as well which is great news. Overall I have been impressed with the progress of the past several weeks. 04/10/18 on evaluation today patient appears to be doing a little bit worse in regard to the right plantar foot at the regional the first metatarsal as well as the left second toe. He tells me that  he ran out of bandages he also tells me he's been wearing his flip-flops a lot at home as well as a new pair of boots that he received as well. This is as opposed to the postop shoes that we had previously given him. Nonetheless overall I feel like that he has had a little bit more friction to the area which is causing things to look a little bit worse today. 04/24/18 on evaluation today patient's wounds for the most part appear to be doing excellent especially in regard to the bilateral second toes. With that being said he does have issues with the wound still on the first metatarsal region and first toe of the right foot which have not completely closed at this point. Nonetheless I do feel like that he is making progress which is good news. 05/08/18 on evaluation today patient's wounds appear to be doing just a little bit worse than normal simply due to the fact that he has not had dressing supplies. He apparently owed $35 in order for them to be shipped and unfortunately is not able to pay that currently. We did give him information for the [facility] today which will hopefully help cover his dressing supplies. He was thankful for this he states is gonna contact them this afternoon. Fortunately otherwise or does not hear any signs of infection. 05/22/18 on evaluation today patient appears to be doing somewhat poorly in regard to his toes. Upon further questioning it appears that he actually has been pulling skin off of the toes which "drive  him crazy". Subsequently as he does this he calls his new areas to tear open and I think this is one of the reasons why his toes are not healing as appropriately as we would like. He fortunately has no signs of infection at this time. With that being said I'm still concerned about the possibility of a fungal infection even though there doesn't appear to be anything bacterial. Potentially nystatin powder could be of benefit for him as far as trying to help this in that regard. 06/05/18 on evaluation today patient actually appears to be doing very well in regard to his foot ulcers. Fortunately there's no signs of infection. He's been tolerating the dressing changes without complication. I'm very pleased with how things seem to be progressing. Fortunately there's no evidence of active infection at this point I do believe nystatin powder has been of benefit for him. 06/19/18 on evaluation today patient actually appears to be doing a little worse compared to last evaluation. Tells me that he has not had any supplies for two weeks. With that being said his plantar foot wounds on the right actually appear to be doing very well. It's actually a second toes in the first toes that are the worst. I think these are areas the was able to get to and pick and he tells me in fact that he destine to pick out some of the dry skin because it causes him annoyance. Nonetheless I think this is somewhat detrimental to him I think it's worth keeping part of the wound open in particular. Fortunately there is no sign of infection at this time although I think that is a risk which I discussed specifically that he's not careful you can end up reopening this and causing greater problems for himself. 07/03/18 on evaluation today patient's wound bed actually shows evidence of good granulation at this time and epithelialization as far as the wounds in general are concerned. He  has been tolerating the dressing changes including the  nystatin without complication. This is good news. With that being said he seems to be still having some trouble with the left second toe distally at this point. 4/9; most of the wounds look quite satisfactory. He requires debridement of the plantar right great toe otherwise everything Darwin, Kylee E. ([id number]) looks superficial to improved. His left second toe is healed. 08/14/18 on evaluation today patient actually appears to be doing very well in regard to his foot ulcerations. In fact he just has three small areas of the right foot still remaining open the left foot is closed and dry at this point. Overall I feel like he is doing quite well which is excellent news. Fortunately there's no signs of active infection. 08/28/18 on evaluation today patient's toes of the right foot actually shows signs of being a little bit worse in regard to the overall open areas at this point. He does tell me that he "washes them well" when he is taking a shower. Again I explained that I want to make sure he's not washing them to aggressively as this could potentially cause issues with overall worsening. 09/12/18 on evaluation today patient actually appears to be doing well in regard to his right foot ulcers. He's been tolerating the dressing changes without complication. Fortunately there's no signs of active infection at this time. With that being said he does have a new one on the dorsal surface of his foot just proximal to his toes he does not know really where this came from. 09/25/18 on evaluation today patient actually appears to be doing somewhat better in regard to his right foot ulcers although he has a new opening on the left second toe unfortunately. This is something that seems to be back and forth he tells me did Pickett scan at this area that is why it's now open yet again. No fevers, chills, nausea, or vomiting noted at this time. 10/16/18 on evaluation today patient actually appears to be doing decently  poorly in regard to his ulcers on his feet. He tells me that for the past 3-4 days he is not having to take therefore is not been putting on any dressings he came in with the socks actually just stuck to the toes with a been draining. He also has not picked up the nystatin powder that was sent to the pharmacy with refills all he has to do is go pick that up he has not done so. He also told me for the more that he feels like his "blood is low" which seems to be referring to his hemoglobin [facility] and subsequently states he needs to go there when he leaves here in order to see about a blood transfusion. Nonetheless I think that's something that he definitely needs to get on top of if he feels like that's necessary. Fortunately there's no signs of active infection at this time. 10/30/18 on evaluation today patient appears to be doing a little worse in regard to his lower extremity ulcers specifically his toes for the most part although he does have an open wound on the right dorsal foot. He has been picking at his toes which damages the skin and leads to what we are seeing today. Fortunately there is no signs of active infection systemically or locally. With that being said I am concerned about the fact that I'm not sure he's ever really gonna feel as long  as he keeps messing with his toes such as picking and bothering them which causes them to reopen on a regular basis. 11/13/18 upon inspection today patient appears to be doing somewhat more poorly currently compared to the last time I saw him. He still has not been wearing his compression stockings even if he did they do not cover the toes which I think may be more of a complication problem for him to be honest. He many compression stockings that do indeed cover his toes but he also needs to have the willpower to actually wear these in a regular basis so that they actually do him any good at all. He also tells  me that he is out of dressing supplies and that he needs to go pick some up so that he will have them. Same is true of the powder although he tells me it's at the pharmacy he just has to go pick it up. 11/20/18 on evaluation today patient actually appears to be doing better with regard to his toe ulcers at this point. Fortunately he does seem to be perform the dressing changes appropriately and this has shown signs of improvement as best I can tell today. He did tell me that he attempted to use his compression stocking for about 15 minutes this morning before coming into the clinic unfortunately he tells me this caused him a lot of pain. I am not sure if there the proper size he was unable to get the new ones that we had ordered for some reason they did not get sent I am assuming this has something to do with insurance. 11/27/2018 on evaluation today patient appears to be doing okay with regard to his foot/toe sores at this time. He has been tolerating the dressing changes without complication though he tells me he does need to get some of the nystatin powder currently. With that being said he was supposed to go this morning for his arterial studies although he states he did not know where to go. He tells me that he drove around and went to several offices but not the vascular office because he did not figure "that was where he was supposed to be". Nonetheless he also had and ask exactly where he should go whenever they call to make his appointment. Nonetheless there is a lot of confusion in this regard I am getting give him the number to the vascular office for him to call and subsequently see about getting this rescheduled. 12/05/2018 on evaluation today patient appears to be doing poorly with regard to his toes. Unfortunately he is continuing to show signs of skin breakdown. He tells me in the past that he has been picking at his toes. He also has not gone back to reschedule the vascular appointment  for arterial studies. He told me today that he did not do this due to the fact that he felt like they were getting charged him upfront for the visit and he did not have the money to pay upfront. With that being said they had scheduled in the first time without making him pay upfront so that really did not make much sense. He does admit to consuming alcohol he tells me that "I had a beer last night". With that being said he also has not been using the powder he Werntz, Ladarian E. ([id number]) says for "2 weeks" with that being said I do not believe he is actually picked up the nystatin powder potentially even for 1-2 months.  We will get a call [facility] to ensure that he still has a refill and see when the last time was he picked one up. Nonetheless I believe that potentially his alcohol consumption may be interfering with his management of his health. Electronic Signature(s) Signed: 12/05/2018 8:51:40 AM By: Worthy Keeler PA-C Entered By: Worthy Keeler on 12/05/2018 08:51:40 Bunyan, [name] ([id number]) -------------------------------------------------------------------------------- Physical Exam Details Patient Name: [name], [name] Date of Service: 12/05/2018 8:15 AM Medical Record Number: [id number] Patient Account Number: [id number] Date of Birth/Sex: 08/27/1950 (68 y.o. M) Treating RN: Montey Hora Primary Care Provider: Lamonte Sakai Other Clinician: Referring Provider: Lamonte Sakai Treating Provider/Extender: Melburn Hake, HOYT Weeks in Treatment: 37 Constitutional Well-nourished and well-hydrated in no acute distress. Respiratory normal breathing without difficulty. clear to auscultation bilaterally. Cardiovascular regular rate and rhythm with normal S1, S2. Psychiatric this patient is able to make decisions and demonstrates good insight into disease process. Alert and Oriented x 3. pleasant and cooperative. Notes Upon inspection today patient's wound bed actually showed signs of  breakdown at multiple location in regard to his toes. The good news is the top of his foot on the right looks much better I think the Tubigrip has been beneficial in this regard. Fortunately there is no signs of active infection at this time. Think the biggest issue here is compliance. Electronic Signature(s) Signed: 12/05/2018 8:52:42 AM By: Worthy Keeler PA-C Entered By: Worthy Keeler on 12/05/2018 08:52:41 Tovey, [name] ([id number]) -------------------------------------------------------------------------------- Physician Orders Details Patient Name: [name], [name] Date of Service: 12/05/2018 8:15 AM Medical Record Number: [id number] Patient Account Number: [id number] Date of Birth/Sex: September 14, 1950 (68 y.o. M) Treating RN: Montey Hora Primary Care Provider: Lamonte Sakai Other Clinician: Referring Provider: Lamonte Sakai Treating Provider/Extender: Melburn Hake, HOYT Weeks in Treatment: 15 Verbal / Phone Orders: No Diagnosis Coding ICD-10 Coding Code Description L97.521 Non-pressure chronic ulcer of other part of left foot limited to breakdown of skin L97.511 Non-pressure chronic ulcer of other part of right foot limited to breakdown of skin E11.621 Type 2 diabetes mellitus with foot ulcer I87.323 Chronic venous hypertension (idiopathic) with inflammation of bilateral lower extremity I89.0 Lymphedema, not elsewhere classified F17.218 Nicotine dependence, cigarettes, 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 abuse 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, right ankle and foot Wound Cleansing Wound #16 Right,Plantar Toe Great o Clean wound with Normal Saline. Wound #18 Right,Dorsal Toe Great o Clean wound with Normal Saline. Wound #2 Right Toe Second o Clean wound with Normal Saline. Wound #20 Left Toe Second o Clean wound with Normal Saline. Wound #21 Left Toe Great o Clean wound with Normal Saline. Wound #22  Right,Distal,Dorsal Foot o Clean wound with Normal Saline. Anesthetic (add to Medication List) Wound #16 Right,Plantar Toe Great o Topical Lidocaine 4% cream applied to wound bed prior to debridement (In Clinic Only). Wound #18 Right,Dorsal Toe Great o Topical Lidocaine 4% cream applied to wound bed prior to debridement (In Clinic Only). Wound #2 Right Toe Second o Topical Lidocaine 4% cream applied to wound bed prior to debridement (In Clinic Only). Wound #20 Left Toe Second o Topical Lidocaine 4% cream applied to wound bed prior to debridement (In Clinic Only). CHALES, PELISSIER ([id number]) Wound #21 Left Toe Great o Topical Lidocaine 4% cream applied to wound bed prior to debridement (In Clinic Only). Wound #22 Right,Distal,Dorsal Foot o Topical Lidocaine 4% cream applied to wound bed prior to debridement (In Clinic Only). Skin Barriers/Peri-Wound Care Wound #16 Right,Plantar Toe  Great o Antifungal powder-Nystatin Wound #18 Right,Dorsal Toe Great o Antifungal powder-Nystatin Wound #2 Right Toe Second o Antifungal powder-Nystatin Wound #20 Left Toe Second o Antifungal powder-Nystatin Wound #21 Left Toe Great o Antifungal powder-Nystatin Wound #22 Right,Distal,Dorsal Foot o Antifungal powder-Nystatin Primary Wound Dressing Wound #16 Right,Plantar Toe Great o Silver Alginate - secure with tape Wound #18 Right,Dorsal Toe Great o Silver Alginate - secure with tape Wound #2 Right Toe Second o Silver Alginate - secure with tape Wound #20 Left Toe Second o Silver Alginate - secure with tape Wound #21 Left Toe Great o Silver Alginate - secure with tape Wound #22 Right,Distal,Dorsal Foot o Silver Alginate - secure with tape Dressing Change Frequency Wound #16 Right,Plantar Toe Great o Change dressing every other day. Wound #18 Right,Dorsal Toe Great o Change dressing every other day. Wound #2 Right Toe Second o Change dressing every  other day. Wound #20 Left Toe Second [name], [name] ([id number]) o Change dressing every other day. Wound #21 Left Toe Great o Change dressing every other day. Wound #22 Right,Distal,Dorsal Foot o Change dressing every other day. Follow-up Appointments o Return Appointment in 1 week. Edema Control Wound #16 Right,Plantar Toe Great o Other: - double layer tubi grip F bilateral Additional Orders / Instructions Wound #16 Right,Plantar Toe Great o Stop Smoking o Increase protein intake. Wound #18 Right,Dorsal Toe Great o Stop Smoking o Increase protein intake. Wound #2 Right Toe Second o Stop Smoking o Increase protein intake. Wound #20 Left Toe Second o Stop Smoking o Increase protein intake. Wound #21 Left Toe Great o Stop Smoking o Increase protein intake. Wound #22 Right,Distal,Dorsal Foot o Stop Smoking o Increase protein intake. Medications-please add to medication list. Wound #16 Right,Plantar Toe Great o Other: - Nystatin Powder Wound #18 Right,Dorsal Toe Great o Other: - Nystatin Powder Wound #2 Right Toe Second o Other: - Nystatin Powder Wound #20 Left Toe Second o Other: - Nystatin Powder Wound #21 Left Toe Great o Other: - Nystatin Powder Wound #22 Right,Distal,Dorsal Foot Geissinger, [name] ([id number]) o Other: - Nystatin Powder Electronic Signature(s) Signed: 12/05/2018 1:01:55 PM By: Montey Hora Signed: 12/07/2018 6:59:38 PM By: Worthy Keeler PA-C Entered By: Montey Hora on 12/05/2018 08:47:57 [name], [name] ([id number]) -------------------------------------------------------------------------------- Problem List Details Patient Name: Mccowen, Holston E. Date of Service: 12/05/2018 8:15 AM Medical Record Number: [id number] Patient Account Number: [id number] Date of Birth/Sex: 1950/09/16 (68 y.o. M) Treating RN: Montey Hora Primary Care Provider: Lamonte Sakai Other Clinician: Referring Provider: Lamonte Sakai Treating Provider/Extender: Melburn Hake, HOYT Weeks in Treatment: 115 Active Problems ICD-10 Evaluated Encounter Code Description Active Date Today Diagnosis L97.521 Non-pressure chronic ulcer of other part of left foot limited to 09/21/2016 No Yes breakdown of skin L97.511 Non-pressure chronic ulcer of other part of right foot limited to 09/21/2016 No Yes breakdown of skin E11.621 Type 2 diabetes mellitus with foot ulcer 09/21/2016 No Yes I87.323 Chronic venous hypertension (idiopathic) with inflammation of 09/21/2016 No Yes bilateral lower extremity I89.0 Lymphedema, not elsewhere classified 09/21/2016 No Yes F17.218 Nicotine dependence, cigarettes, with other nicotine-induced 09/21/2016 No Yes disorders F10.19 Alcohol abuse with unspecified alcohol-induced disorder 09/21/2016 No Yes M86.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, right ankle and foot 02/21/2017 No Yes Inactive Problems Resolved Problems Brayboy, AMMIEL GUINEY ([id number]) Electronic Signature(s) Signed: 12/05/2018 8:18:14 AM By: Worthy Keeler PA-C Entered By: Worthy Keeler on 12/05/2018 08:18:13 Delph, [name] -------------------------------------------------------------------------------- Progress Note Details Patient Name: Lambing, Gilmar E. Date of Service: 12/05/2018 8:15 AM Medical  Record Number: [id number] Patient Account Number: [id number] Date of Birth/Sex: 01/29/1951 (68 y.o. M) Treating RN: Montey Hora Primary Care Provider: Lamonte Sakai Other Clinician: Referring Provider: Lamonte Sakai Treating Provider/Extender: Melburn Hake, HOYT Weeks in Treatment: 115 Subjective Chief Complaint Information obtained from Patient Patient presents for treatment of an open diabetic ulcer to both feet History of Present Illness (HPI) 68 year old patient here to see as for bilateral feet ulceration to on his left first and second toe and 2 on his right first and second toe, which she's had for about 4 months. 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 Donella Stade. The patient is also undergoing workup by medical oncology for a intra-abdominal lymphadenopathy. 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 He is also status post appendectomy, inguinal lymph node biopsy, rectal biopsy and rectal examination under anesthesia. he currently smokes cigarettes about half packet a day. In March of this year he was seen by Dr. Hortencia Pilar, for evaluation of bilateral varicose veins and besides wearing compression stockings he had recommended laser ablation of the right and left great saphenous veins to eleviate the symptoms and complications of severe superficial venous reflux disease. He also recommended lymphedema pumps for better control of his lymphedema. The patient recently has had on 08/23/2016, right greater saphenous vein ablation with the laser energy Earlier lower extremity venous reflux examination done on 05/08/2016 showed no DVT or SVT both lower legs but incompetence of bilateral great saphenous veins was present. A lower arterial study was also done and there was no significant right lower and left lower extremity problems based on a normal toe brachial index bilaterally and the ABI was 1.21 the left and 1.23 on the right. His post ablation venous duplex examination showed successful ablation of the right GS vein with thrombus formation 2 below the right saphenofemoral junction. The deep system was patent without evidence of thrombosis and this was done on 08/30/2016. 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 Addendum: regarding his x-rays done  today and x-ray of the left foot -- IMPRESSION: No objective evidence of osteomyelitis. There are soft tissue changes which may reflect cellulitis. X-ray of the right foot -- IMPRESSION:Findings compatible with cellulitis of the toes. No objective evidence of osteomyelitis is observed. 10/01/16 on evaluation today patient's wounds appeared to be doing some better. I did review the x-rays as well which showed no evidence of osteomyelitis although there was evidence on x-ray of cellulitis. He fortunately is not having any discomfort although he continues to have some swelling. He does not remember being on any antibiotics recently. 10/15/16 on evaluation today patient's wounds overall appear to be doing better although he does have a new location noted on the left foot. Fortunately he is not having significant pain. It almost has the appearance that something is rubbing on the end of his toes but he wears the open toe shoes and according to what he is telling me never wears anything that would rub on his foot. There is no evidence of infection and specifically no evidence of a fungal infection 10/22/16 On evaluation today patient's wounds appeared to be doing better compared to last week  in regard to his bilateral lower extremities. Fortunately I happy with how things are progressing although he still has ulcers I feel like that he is improving and appropriate manner. TARA, WICH ([id number]) 11/12/16 on evaluation today patient appears to be doing well in regard to his bilateral feet and the respective wounds. We have been using surrounding her dressings along with an antifungal cream which seems to be doing very well. He has no bilateral dysfunction noticed that the rituals are weight loss at this point. He also has no nausea or vomiting a note purulent discharge. He did see Vein and vascular today and he tells me that they told him he could have surgery for his venous stasis but they did not feel  like it was worth it in his words. Fortunately patient's wounds do appear to be getting sneakily better. 11/26/2016 -- he says he is going to have some surgery during this week at [facility] for possibly a colon resection. 12/31/2016 -- the patient has been noncompliant with his smoking and I'm not sure whether he is also started drinking again. He continues to be very nonchalant about his care 01/14/2017 -- the patient's HandP has been reviewed well and I understand he is being compliant with trying to give up smoking and his local dressing changes. He does not have any surgical options of 4 to him by his vascular surgeons.he was last seen in early August by Dr. Hortencia Pilar who recommended compression stockings,and possibly lymph pumps in 2-3 months after doing a review ultrasound. 01/28/2017 - the patient did not have any fresh complaints but on examination I noted a large lacerated wound on the plantar aspect of his right fourth toe which had a lot of necrotic debris and it probes down to bone. 02/07/2017 -- x-ray of the right foot -- IMPRESSION: Soft tissue swelling about the first through fourth toes consistent with cellulitis. New destructive change in the tuft of the distal phalanx of the great toe is consistent with osteomyelitis. 02/14/2017 -- the patient's MRI is pending this coming Monday and he still continues to smoke. We have again gone over off loading of his wounds in great detail and he says he's been compliant. 02/21/2017 -- MR of the right foot -- IMPRESSION: 1. Soft tissue ulcer at the tip of the first, second and third toe knows. Cortical irregularity and bone marrow edema in the first distal phalanx most concerning for osteomyelitis. Mild marrow edema in the second and third distal phalanx without definite cortical destruction which may reflect early osteomyelitis versus reactive marrow edema. 2. Soft tissue edema surrounding the first phalanx most consistent with  cellulitis. the patient was also recently evaluated by his medical oncologist Dr. 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 She is treating him with weekly Procrit. She is also keeping intra-abdominal lymphadenopathy and right lower lobe lung nodule under observation. 04/04/2017 -- he was seen by Dr. Adrian Prows on 03/25/2017 -- after review he empirically put him on ciprofloxacin and doxycycline as they have good bone penetration and good bioavailability and it will cover the usual pathogens and diabetic foot osteomyelitis. He will check inflammatory markers and plan a 37-69 week old records. C-reactive protein was 0.3 and the ESR was 72 04/18/17 on evaluation today patient appears to be doing about the same in regard to his lower extremity wounds bilaterally. He has continued to use the antifungal cream which does seem  to be beneficial. Nonetheless the ulcers do seem to in some areas be epithelial eyes over and in other areas are still open. He is having no significant discomfort. 04/25/17-he is here in follow-up evaluation for multiple ulcerations to multiple toes bilaterally. He states he did see Dr. Ola Spurr again last week and continues antibiotic therapy. He is voicing no complaints or concerns, will continue with current treatment plan will possibility of adding compression therapy next week after an additional week of treatment/lotions to BLE prescribed by Dr Ola Spurr 05/02/17 he is here in follow up for for multiple ulcers to multiple toes bilaterally. we will stop using antifungal cream and will continue with silvercel and follow up next week 05/09/17-he is here in follow-up for multiple ulcerations to multiple toes bilaterally. There is improvement in appearance. He has not completely stopped using antifungal cream, but admits he has not using it between the toes. He has an appointment with Dr. Ola Spurr on  2/11, continues on doxycycline and Cipro. It has been 5 weeks of antibiotic therapy, we will order plain film xray to evaluate for osteomyelitis next week, prior to follow up with ID. Will continue with silvercel and follow up next week 05/16/17-he is here in follow-up evaluation for multiple ulcerations to multiple toes bilaterally and new wound to the right posterior heel. There is essentially no change in appearance, deteriorating measurements; he has a history of waxing and waning measurements. He admits that he continues to apply moisturizer/cream/ointment to his toes despite weekly reminders to only apply silvercel to his toes. He states that he thinks the surgical shoe contributed to the superficial ulcer to his posterior heel, he is unable to articulate if this was an area of dry cracked skin as he has a similar area to the left heel. He now is wearing open toed slippers. He has an appointment with Dr. Ola Spurr on 2/11. We have ordered x-rays for her bilateral feet; Dible, Kota E. ([id number]) he was advised to obtain the x-rays today or tomorrow. He will follow-up next week 05/23/17-he is here in follow-up evaluation for multiple ulcerations to multiple toes bilaterally and the right posterior heel. There is improvement in maceration. He has been compliant and not applying any moisturizing agent to his toes. He has been using Lac-Hydrin for his lower extremities with improvement. He did not go to his appointment on Monday with Dr. Ola Spurr secondary to financial concerns. X-rays for her bilateral feet showed: LEFT FOOT with slight erosion of the tuft of the distal phalanges of the left first and second toe suspicious for osteomyelitis, RIGHT FOOT with 1.erosion of the tufts of the distal phalanges of the right first second and possibly third toes consistent with osteomyelitis, 2 no definitive abnormality of the calcaneus is seen on the images obtained, 3. Plantar calcaneal degenerative  spur. We briefly discussed hyperbaric adjunctive therapy for treatment of chronic refractory osteomyelitis. I do not find an a1c in EMR, will contact PCP for record, or order if needed. He has been encouraged to contact Dr Ola Spurr office regarding the follow-up appointment, encouraged him to inquire about payment plan. We will continue with same treatment plan and follow-up next week. He states he is still taking antibiotics and has "a lot" left. He states he has been taking them as directed, 2 pills twice daily. According to Dr. Blane Ohara office notes he was originally started on 12/17 for 4 weeks and extended on 1/14 for an additional 4 weeks. He should be done with his antibiotic therapy, he  was advised to bring his bottles and to his next appointment, we will contact pharmacy. 05/30/17-he is here in follow-up evaluation for multiple ulcerations to multiple toes bilaterally and the right posterior heel. He is accompanied by his brother-in-law. Wounds are stable. He has yet to make up with Dr. Ola Spurr. We contacted his PCP, with no record of recent A1c we will draw an A1c. His brother-in-law states that he was taken off all of his diabetic medication secondary to kidney function. 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 The antibiotics were counted and have approximately 2 weeks left, although they should be complete. The pill organizer reveals missing days. We discussed the need for consistent medications, to have optimal benefit of medication. He has a cousin that lives with him and he will ask her to check his organizer daily. He has been advised to follow up with Dr Ola Spurr, and will go by the office today. He has been advised to quit smoking. 06/06/17-he is here in follow up evaluation. He has had to make an appointment with Dr. Ola Spurr. He did have blood work obtained, a1c 5. He  continues to take antibiotic therapy. Significant improvement in bilateral lower extremity edema with compression therapy. Essentially no change in ulcerations to toes. He states he is "going to try something different" and "let me know next week" if it works; he would not provide any additional information and was encouraged to follow our orders. We will follow up next week 06/13/17-he is here in follow-up evaluation. He has an appointment with Dr. Ola Spurr tomorrow morning. He states he purchased an ointment from [facility] and applied to his toes for 3 days, he does not remember the name of the ointment. There is improvement to his wounds, minimal maceration. He continues to take antibiotic therapy, this should have been completed last month. His brother-in-law who regularly accompanies his appointments was asked to take the bottles to the appointment tomorrow with Dr. Ola Spurr so he is aware. We will continue with 3 layer compression, and order OPEN TOE compression 20-30mmHg; we will apply compression stockings next week. He continues to smoke, smoked "2 cigarettes" last week 06/20/17 on evaluation today patient did receive his compression stockings which he has with him today for both lower extremities. With that being said he tells me at this point in time that he is very happy to have these he really is not a big fan of the compression wraps that we have been utilizing although they have been of great benefit for him. Nonetheless at this point he does want to switch to the compression stockings. In my opinion as long as he is continuing with compression I'm okay with the stockings or the wraps. 06/27/17-he is here in follow-up evaluation for multiple ulcerations to his bilateral toes. There is some improvement in appearance. He is compliant in wearing his compression stockings with significant improvement in lower extremity edema. He saw Dr Ola Spurr on 3/8, per his notes they would redraw  ESR and CRP; plan to continue antibiotic therapy if these remain elevated. I do not see an ESR or CRP level in Epic. The patient continues to take antibiotics. 07/11/17-He is here in follow-up evaluation for multiple ulcerations to multiple toes bilaterally. He presents with complete epithelialization to the right third toe; there has been no deterioration. He continues on antibiotic therapy. He will follow-up next week 07/18/17-He is here in follow-up evaluation for multiple  ulcerations to multiple toes bilaterally. He continues to make improvement. He continues on antibiotic therapy. He states he has been using something additional to our orders, he does not elaborate but states he will bring it in next week. 07/25/17-He is here in follow up evaluation for multiple ulcerations to bilateral toes. He is stable. He has completed antibiotic therapy. He admits to "filing" his toes after showers each evening, this is what he was referencing last week; he does not filing for the wounds. We will switch to [facility] and monitor for any improvement, he will follow-up next week 08/01/17-He is here in follow-up evaluation. He admits to "picking" at his toes after cleansing yesterday, leading to new areas of tissue loss on the bilateral second toe. There is improvement noted to the bilateral great toe. We will dress toes today and hope that they maintain until Monday where he will come in for a nurse visit. He has been advised, multiple times with expressed verbalization, to change the dressings to silvercel if the dressings get wet prior to [facility] appointment. 08/08/17-He is here in follow-up evaluation for bilateral first and second toe ulcerations. There is significant improvement to all ulcerations since last visit. We will switch to [facility] to all wounds and he will continue with nurse visits on a Ontko, Troye E. ([id number]) Monday/Thursday schedule and follow-up with me in 2 weeks. He continues  to smoke, 1-3 cigarettes per day, and has been encouraged to not smoke until his next follow-up in 2 weeks. 08/15/17 on evaluation today patient's ulcers on his toes actually appear to be doing fairly well the right to ulcers may be a little bit more moist compared to the left I'm not really sure exactly why as the openings appear to be very small and I'm not seeing any evidence of anything significant as far as you lightest or otherwise. Nonetheless this may just be a small setback he's been doing very well with the [facility] Dressing 08/22/17-He is here in follow-up evaluation for ulcerations to his bilateral first and second toes, there is small/scant amount of drainage noted on today's dressing. He continues with Hydrofera Blue. He continues to smoke, 1 cigarette a day. Voices no complaint or concerns, compliant and compression therapy today. He'll follow-up next week 08/29/17-He is here in follow-up evaluation for ulcerations to his bilateral first and second toes, with a new wound to the left third toe. He states he cut himself while cutting the toenail. He presents today with more maceration and increased measurements; waxing and waning measurements and moisture has been an ongoing issue. He continues to smoke a proximally 1 cigarette per day, but states he has not smoked since Sunday. We will initiate medihoney daily and evaluate next week. He presents today without compression stockings 09/05/17-He is here in follow-up evaluation for ulcerations to bilateral first and second and left third toe. He did not pick up the medihoney and therefore has not been changing his dressing. He admits to inconsistent where of compression stockings, admits to pain to his bilateral lower extremities with unilateral edema (right greater than left). The ulcerations to multiple toes are more macerated and larger in size than last week. He is wearing compression therapy today. He admits to an overall feeling of  weakness and fatigue and has been encouraged to contact his PCP for evaluation; he has a known history of anemia and intraabdominal lymphadenopathy. We will return to silvercel and he will follow up next week 09/19/17-He is here in  follow-up evaluation for ulcerations to bilateral first and second toe.he was unable to make last week's appointment. The culture that was obtained on 5/30 grew clindamycin sensitive MRSA; he was initiated on 6/4 but did not start it until 6/7. There is significant improvement in all wounds, healing to left second and third toe. He also admits to having no alcohol or smoking for the last 6 days. We will continue with same treatment plan I will extend the clindamycin for an additional 10 days and he will follow-up next week. 09/26/17- He is here in follow evaluation for multiple ulcerations to multiple toes bilaterally. He continues to be non-compliant with dressing, compression therapy. He states he is taking the antibiotics as prescribed, although his recall is inconsistent. There is noted deterioration in all ulcers. We will continue same treatment plan, antibiotic therapy and he will follow up next week. 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 This information is obtained from the medical record, as he cannot articulate specifics to his diagnosis or treatment plan. 10/17/17-He is here in follow-up evaluation for multiple ulcerations to multiple toes bilaterally. There is chronic waxing and waning improvement/deterioration. On today's exam they appear improved, dry without maceration. He continues to apply a variety of topical agents, stating that he has switched between the blue product and silver product. In my opinion, this will be a chronic waxing and waning giving his intermittent compliance.  He will follow-up in 2 weeks. As it relates to the biopsy taken from his buttock, he has not followed up with that and has not started radiation therapy. 10/31/17-He is here for evaluation for multiple ulcerations to multiple toes bilaterally. Continues with waxing and waning improvement versus deterioration. They are stable on today's exam without significant maceration. 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 We will continue with every 2 week follow-up appointments 11/14/17- He is seen in follow-up evaluation for multiple ulcerations to multiple bilateral toes. He is scheduled for excision of basal cell carcinoma to his right buttock on Monday 8/12. He continues to have chronic waxing and waning of his ulcers; today is a stable day with minimal drainage and left ear is better than right toes. I will recount to infectious disease regarding chronic suppressive therapy. He admits to being more compliant with compression stockings, although he is not wearing any today. He has been encouraged to continue with dressing changes as ordered and compression therapy daily. He will be seen in 2 weeks 11/28/17-He is seen in follow up evaluation for multiple ulcerations to multiple toes bilaterally. He had an overnight admission at [facility] (8/11-8/12) for symptomatic anemia, received two units prbc. His surgical date has been moved to 9/94 excision of basal cell carcinoma to the right buttock. He has been noncompliant in wearing compression stockings, applying topical treatment as ordered; he has been applying a "cream", Hydrofera Blue, silvercel. He is currently on no antibiotic therapy; we will send for re-referral to ID for suppressive therapy given his multiple co-morbidities, poor surgical candidate, unwilling to have toe amputation and general non-adherence. 12/19/17 on evaluation today patient actually appears to be doing very well in regard to his toes at least  compared to last time I saw him. He again has been coming to our office for quite a bit of time. With that  being said he did have surgical excision of what appears to have been according to records a basal sale carcinoma which was removed 12/16/17. He states is hurting a little bit but doing well in general. I'm see were not taking care of this area dermatology is. He does have his appointment with infectious disease upcoming which they re-consult for suppressive therapy that's next Monday. 01/02/18 on evaluation today patient actually appears to be doing rather well in regard to his toe ulcers although he tells me he Calderone, Kyon E. ([id number]) has been put in an ointment on this which I am concerned may be causing some additional moisture buildup which we really do not want. He supposed be using silver cell which is mainly thing I want him to be utilizing. Fortunately there does not appear to be any evidence of worsening infection which is good news. He has had surgery on his gluteal region where he had an excision of a cancerous area fortunately that seems to be doing well although he is having some discomfort. 02/06/18 on evaluation today patient actually appears to be doing well at all locations except for his second toe use bilaterally where the distal tip is still wrong open. He did state that he can't stand for dry skin to buildup on his toes subsequently as a question and further it sounds as if he actually pills this all which is likely keeping the toe is wrong and open. Also think that's what happened in the bottom of his foot although that appears to have healed at this point. There does not appear to be any evidence of infection. 02/20/18 on evaluation today patient appears to be doing better in regard to his foot ulcers. Everything has shown signs of improvement which is excellent news. Overall I'm very pleased with how things appear currently. The patient states he's not picking her  point at any skin and that he wears shoes at all times when he is ambulating at home. Both are things that I've advocated and recommended for him in the past. 03/13/18 on evaluation today patient actually appears to be doing very well in regard to the bilateral toes in the overall appearance of the wound bed locations. Fortunately there does not appear to be any signs of infection at this time. He states that he is very pleased with how things seem to be progressing to be honest as am I. 03/27/1899 evaluation today patient appears to be doing well in regard to his toe ulcers. He just has a few areas remaining and these appear to be showing signs of improvement as well which is great news. Overall I have been impressed with the progress of the past several weeks. 04/10/18 on evaluation today patient appears to be doing a little bit worse in regard to the right plantar foot at the regional the first metatarsal as well as the left second toe. He tells me that he ran out of bandages he also tells me he's been wearing his flip-flops a lot at home as well as a new pair of boots that he received as well. This is as opposed to the postop shoes that we had previously given him. Nonetheless overall I feel like that he has had a little bit more friction to the area which is causing things to look a little bit worse today. 04/24/18 on evaluation today patient's wounds for the most part appear to be doing excellent especially in regard to the bilateral second toes. With that being said  he does have issues with the wound still on the first metatarsal region and first toe of the right foot which have not completely closed at this point. Nonetheless I do feel like that he is making progress which is good news. 05/08/18 on evaluation today patient's wounds appear to be doing just a little bit worse than normal simply due to the fact that he has not had dressing supplies. He apparently owed $35 in order for them to be  shipped and unfortunately is not able to pay that currently. We did give him information for the [place] today which will hopefully help cover his dressing supplies. He was thankful for this he states is gonna contact them this afternoon. Fortunately otherwise or does not hear any signs of infection. 05/22/18 on evaluation today patient appears to be doing somewhat poorly in regard to his toes. Upon further questioning it appears that he actually has been pulling skin off of the toes which "drive him crazy". Subsequently as he does this he calls his new areas to tear open and I think this is one of the reasons why his toes are not healing as appropriately as we would like. He fortunately has no signs of infection at this time. With that being said I'm still concerned about the possibility of a fungal infection even though there doesn't appear to be anything bacterial. Potentially nystatin powder could be of benefit for him as far as trying to help this in that regard. 06/05/18 on evaluation today patient actually appears to be doing very well in regard to his foot ulcers. Fortunately there's no signs of infection. He's been tolerating the dressing changes without complication. I'm very pleased with how things seem to be progressing. Fortunately there's no evidence of active infection at this point I do believe nystatin powder has been of benefit for him. 06/19/18 on evaluation today patient actually appears to be doing a little worse compared to last evaluation. Tells me that he has not had any supplies for two weeks. With that being said his plantar foot wounds on the right actually appear to be doing very well. It's actually a second toes in the first toes that are the worst. I think these are areas the was able to get to and pick and he tells me in fact that he destine to pick out some of the dry skin because it causes him annoyance. Nonetheless I think this is somewhat  detrimental to him I think it's worth keeping part of the wound open in particular. Fortunately there is no sign of infection at this time although I think that is a risk which I discussed specifically that he's not careful you can end up reopening this and causing greater problems for himself. ELIO, HADEN ([id number]) 07/03/18 on evaluation today patient's wound bed actually shows evidence of good granulation at this time and epithelialization as far as the wounds in general are concerned. He has been tolerating the dressing changes including the nystatin without complication. This is good news. With that being said he seems to be still having some trouble with the left second toe distally at this point. 4/9; most of the wounds look quite satisfactory. He requires debridement of the plantar right great toe otherwise everything looks superficial to improved. His left second toe is healed. 08/14/18 on evaluation today patient actually appears to be doing very well in regard to his foot ulcerations. In fact he just has three small areas of the right foot  still remaining open the left foot is closed and dry at this point. Overall I feel like he is doing quite well which is excellent news. Fortunately there's no signs of active infection. 08/28/18 on evaluation today patient's toes of the right foot actually shows signs of being a little bit worse in regard to the overall open areas at this point. He does tell me that he "washes them well" when he is taking a shower. Again I explained that I want to make sure he's not washing them to aggressively as this could potentially cause issues with overall worsening. 09/12/18 on evaluation today patient actually appears to be doing well in regard to his right foot ulcers. He's been tolerating the dressing changes without complication. Fortunately there's no signs of active infection at this time. With that being said he does have a new one on the dorsal surface of  his foot just proximal to his toes he does not know really where this came from. 09/25/18 on evaluation today patient actually appears to be doing somewhat better in regard to his right foot ulcers although he has a new opening on the left second toe unfortunately. This is something that seems to be back and forth he tells me did Pickett scan at this area that is why it's now open yet again. No fevers, chills, nausea, or vomiting noted at this time. 10/16/18 on evaluation today patient actually appears to be doing decently poorly in regard to his ulcers on his feet. He tells me that for the past 3-4 days he is not having to take therefore is not been putting on any dressings he came in with the socks actually just stuck to the toes with a been draining. He also has not picked up the nystatin powder that was sent to the pharmacy with refills all he has to do is go pick that up he has not done so. He also told me for the more that he feels like his "blood is low" which seems to be referring to his hemoglobin he [facility] and subsequently states he needs to go there when he leaves here in order to see about a blood transfusion. Nonetheless I think that's something that he definitely needs to get on top of if he feels like that's necessary. Fortunately there's no signs of active infection at this time. 10/30/18 on evaluation today patient appears to be doing a little worse in regard to his lower extremity ulcers specifically his toes for the most part although he does have an open wound on the right dorsal foot. He has been picking at his toes which damages the skin and leads to what we are seeing today. Fortunately there is no signs of active infection systemically or locally. With that being said I am concerned about the fact that I'm not sure he's ever really gonna feel as long as he keeps messing with his toes such as picking and bothering them which causes them to  reopen on a regular basis. 11/13/18 upon inspection today patient appears to be doing somewhat more poorly currently compared to the last time I saw him. He still has not been wearing his compression stockings even if he did they do not cover the toes which I think may be more of a complication problem for him to be honest. He many compression stockings that do indeed cover his toes but he also needs to have the willpower to actually wear these  in a regular basis so that they actually do him any good at all. He also tells me that he is out of dressing supplies and that he needs to go pick some up so that he will have them. Same is true of the powder although he tells me it's at the pharmacy he just has to go pick it up. 11/20/18 on evaluation today patient actually appears to be doing better with regard to his toe ulcers at this point. Fortunately he does seem to be perform the dressing changes appropriately and this has shown signs of improvement as best I can tell today. He did tell me that he attempted to use his compression stocking for about 15 minutes this morning before coming into the clinic unfortunately he tells me this caused him a lot of pain. I am not sure if there the proper size he was unable to get the new ones that we had ordered for some reason they did not get sent I am assuming this has something to do with insurance. 11/27/2018 on evaluation today patient appears to be doing okay with regard to his foot/toe sores at this time. He has been tolerating the dressing changes without complication though he tells me he does need to get some of the nystatin powder currently. With that being said he was supposed to go this morning for his arterial studies although he states he did not know where to go. He tells me that he drove around and went to several offices but not the vascular office because he did not figure "that was where he was supposed to be". Nonetheless he also had and ask  exactly where he should go whenever they call to make his appointment. Nonetheless there is a lot of confusion in this regard I am getting give him the number to the vascular office for him to call and subsequently see about getting this rescheduled. BREVEN, GUIDROZ ([id number]) 12/05/2018 on evaluation today patient appears to be doing poorly with regard to his toes. Unfortunately he is continuing to show signs of skin breakdown. He tells me in the past that he has been picking at his toes. He also has not gone back to reschedule the vascular appointment for arterial studies. He told me today that he did not do this due to the fact that he felt like they were getting charged him upfront for the visit and he did not have the money to pay upfront. With that being said they had scheduled in the first time without making him pay upfront so that really did not make much sense. He does admit to consuming alcohol he tells me that "I had a beer last night". With that being said he also has not been using the powder he says for "2 weeks" with that being said I do not believe he is actually picked up the nystatin powder potentially even for 1-2 months. We will get a call [facility] to ensure that he still has a refill and see when the last time was he picked one up. Nonetheless I believe that potentially his alcohol consumption may be interfering with his management of his health. Patient History Information obtained from Patient. Social History Current every day smoker, Marital Status - Widowed, Alcohol Use - Daily - quit drinking about a week ago, Drug Use - No History, Caffeine Use - Moderate. Medical History Eyes Denies history of Cataracts, Glaucoma, Optic Neuritis Ear/Nose/Mouth/Throat Denies history of Chronic sinus problems/congestion, Middle ear problems Hematologic/Lymphatic Patient  has history of Anemia, Lymphedema Denies history of Hemophilia, Human Immunodeficiency Virus, Sickle Cell  Disease Respiratory Denies history of Aspiration, Asthma, Chronic Obstructive Pulmonary Disease (COPD), Pneumothorax, Sleep Apnea, Tuberculosis Cardiovascular Patient has history of Congestive Heart Failure, Hypertension, Peripheral Venous Disease Denies history of Angina, Arrhythmia, Coronary Artery Disease, Deep Vein Thrombosis, Hypotension, Myocardial Infarction, Peripheral Arterial Disease, Phlebitis, Vasculitis Gastrointestinal Denies history of Cirrhosis , Colitis, Crohn s, Hepatitis A, Hepatitis B, Hepatitis C Endocrine Patient has history of Type II Diabetes Genitourinary Denies history of End Stage Renal Disease Immunological Denies history of Lupus Erythematosus, Raynaud s, Scleroderma Integumentary (Skin) Denies history of History of Burn, History of pressure wounds Musculoskeletal Denies history of Gout, Rheumatoid Arthritis, Osteoarthritis, Osteomyelitis Neurologic Patient has history of Neuropathy Denies history of Dementia, Quadriplegia, Paraplegia, Seizure Disorder Medical And Surgical History Notes 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 Respiratory Denies complaints or symptoms of Chronic or frequent coughs, Shortness of Breath. Cardiovascular Complains or has symptoms of LE edema. Rosario, Rayansh E. ([id number]) Denies complaints or symptoms of Chest pain. Psychiatric Denies complaints or symptoms of Anxiety, Claustrophobia. Objective Constitutional Well-nourished and well-hydrated in no acute distress. Vitals Time Taken: 8:15 AM, Height: 69 in, Weight: 168 lbs, BMI: 24.8, Temperature: 98.8 F, Pulse: 75 bpm, Respiratory Rate: 18 breaths/min, Blood Pressure: 163/72 mmHg. Respiratory normal breathing without difficulty. clear to auscultation bilaterally. Cardiovascular regular rate  and rhythm with normal S1, S2. Psychiatric this patient is able to make decisions and demonstrates good insight into disease process. Alert and Oriented x 3. pleasant and cooperative. General Notes: Upon inspection today patient's wound bed actually showed signs of breakdown at multiple location in regard to his toes. The good news is the top of his foot on the right looks much better I think the Tubigrip has been beneficial in this regard. Fortunately there is no signs of active infection at this time. Think the biggest issue here is compliance. Integumentary (Hair, Skin) Wound #16 status is Open. Original cause of wound was Not Known. The wound is located on the [employer]. The wound measures 1.5cm length x 2cm width x 0.1cm depth; 2.356cm^2 area and 0.236cm^3 volume. There is Fat Layer (Subcutaneous Tissue) Exposed exposed. There is no tunneling or undermining noted. There is a large amount of serous drainage noted. The wound margin is flat and intact. There is small (1-33%) pink granulation within the wound bed. There is a large (67-100%) amount of necrotic tissue within the wound bed including Eschar and Adherent Slough. Wound #18 status is Open. Original cause of wound was Gradually Appeared. The wound is located on the Right,Dorsal [employer] The wound measures 2cm length x 3cm width x 0.1cm depth; 4.712cm^2 area and 0.471cm^3 volume. There is Fat Layer (Subcutaneous Tissue) Exposed exposed. There is no tunneling or undermining noted. There is a large amount of serous drainage noted. The wound margin is flat and intact. There is small (1-33%) pale granulation within the wound bed. There is a large (67-100%) amount of necrotic tissue within the wound bed including Eschar and Adherent Slough. Wound #2 status is Open. Original cause of wound was Gradually Appeared. The wound is located on the Right Toe Second. The wound measures 2.5cm length x 3cm width x 0.1cm depth; 5.89cm^2  area and 0.589cm^3 volume. There is Fat Layer (Subcutaneous Tissue) Exposed exposed. There is no tunneling or  undermining noted. There is a large amount of serous drainage noted. The wound margin is flat and intact. There is small (1-33%) pink granulation within the wound bed. There is a large (67-100%) amount of necrotic tissue within the wound bed including Eschar and Adherent Slough. Wound #20 status is Open. Original cause of wound was Pressure Injury. The wound is located on the Left Toe Second. The wound measures 2cm length x 2.5cm width x 0.1cm depth; 3.927cm^2 area and 0.393cm^3 volume. There is Fat Layer (Subcutaneous Tissue) Exposed exposed. There is no tunneling noted. There is a medium amount of serous drainage noted. The wound margin is flat and intact. There is medium (34-66%) pink granulation within the wound bed. There is a medium Mckell, Tre E. ([id number]) (34-66%) amount of necrotic tissue within the wound bed including Eschar and Adherent Slough. Wound #21 status is Open. Original cause of wound was Shear/Friction. The wound is located on the Left Toe Great. The wound measures 4cm length x 2cm width x 0.1cm depth; 6.283cm^2 area and 0.628cm^3 volume. The wound is limited to skin breakdown. There is no tunneling or undermining noted. There is a small amount of serous drainage noted. The wound margin is flat and intact. There is small (1-33%) pink granulation within the wound bed. There is a large (67-100%) amount of necrotic tissue within the wound bed including Eschar and Adherent Slough. Wound #22 status is Open. Original cause of wound was Gradually Appeared. The wound is located on the Right,Distal,Dorsal Foot. The wound measures 1.5cm length x 1.8cm width x 0.1cm depth; 2.121cm^2 area and 0.212cm^3 volume. The wound is limited to skin breakdown. There is no tunneling or undermining noted. There is a large amount of serous drainage noted. The wound margin is flat and intact.  There is medium (34-66%) pink granulation within the wound bed. There is a medium (34-66%) amount of necrotic tissue within the wound bed including Eschar and Adherent Slough. 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 Wound #18 Right,Dorsal Toe Great: Clean wound with Normal Saline. Wound #2 Right Toe Second: Clean wound with Normal Saline. Wound #20 Left Toe Second: Clean wound with Normal Saline. Wound #21 Left Toe Great: Clean wound with Normal Saline. Wound #22 Right,Distal,Dorsal Foot: Clean wound with Normal Saline. Anesthetic (add to Medication List): Wound #16 Right,Plantar Toe Great: Topical Lidocaine 4% cream applied to wound bed prior to debridement (In Clinic Only). Wound #18 Right,Dorsal Toe Great: Topical Lidocaine 4% cream applied to wound bed prior to debridement (In Clinic Only). Wound #2 Right Toe Second: Topical Lidocaine 4% cream applied to wound bed prior to debridement (In Clinic Only). RAMIEL, FORTI ([id number]) Wound #20 Left Toe Second: Topical Lidocaine 4% cream applied to wound bed prior to debridement (In Clinic Only). Wound #21 Left Toe Great: Topical Lidocaine 4% cream applied to wound bed prior to debridement (In Clinic Only). Wound #22 Right,Distal,Dorsal Foot: Topical Lidocaine 4% cream applied to wound bed prior to debridement (In Clinic Only). Skin  Barriers/Peri-Wound Care: Wound #16 Right,Plantar Toe Great: Antifungal  powder-Nystatin Wound #18 Right,Dorsal Toe Great: Antifungal powder-Nystatin Wound #2 Right Toe Second: Antifungal powder-Nystatin Wound #20 Left Toe Second: Antifungal powder-Nystatin Wound #21 Left Toe Great: Antifungal powder-Nystatin Wound #22 Right,Distal,Dorsal Foot: Antifungal powder-Nystatin Primary Wound Dressing: Wound #16 Right,Plantar Toe Great: Silver Alginate - secure with tape Wound #18 Right,Dorsal Toe Great: Silver Alginate - secure with tape Wound #2 Right Toe Second: Silver Alginate - secure with tape Wound #20 Left Toe Second: Silver Alginate - secure with tape Wound #21 Left Toe Great: Silver Alginate - secure with tape Wound #22 Right,Distal,Dorsal Foot: Silver Alginate - secure with tape Dressing Change Frequency: Wound #16 Right,Plantar Toe Great: Change dressing every other day. Wound #18 Right,Dorsal Toe Great: Change dressing every other day. Wound #2 Right Toe Second: Change dressing every other day. Wound #20 Left Toe Second: Change dressing every other day. Wound #21 Left Toe Great: Change dressing every other day. Wound #22 Right,Distal,Dorsal Foot: Change dressing every other day. Follow-up Appointments: Return Appointment in 1 week. Edema Control: Wound #16 Right,Plantar Toe Great: Other: - double layer tubi grip F bilateral Additional Orders / Instructions: Wound #16 Right,Plantar Toe Great: Stop Smoking Increase protein intake. Wound #18 Right,Dorsal Toe Great: Stop Smoking Increase protein intake. Wound #2 Right Toe Second: Stop Smoking Increase protein intake. DANDRA, VELARDI ([id number]) Wound #20 Left Toe Second: Stop Smoking Increase protein intake. Wound #21 Left Toe Great: Stop Smoking Increase protein intake. Wound #22 Right,Distal,Dorsal Foot: Stop Smoking Increase protein intake. Medications-please add to medication list.: Wound #16  Right,Plantar Toe Great: Other: - Nystatin Powder Wound #18 Right,Dorsal Toe Great: Other: - Nystatin Powder Wound #2 Right Toe Second: Other: - Nystatin Powder Wound #20 Left Toe Second: Other: - Nystatin Powder Wound #21 Left Toe Great: Other: - Nystatin Powder Wound #22 Right,Distal,Dorsal Foot: Other: - Nystatin Powder 1. I would recommend that we go ahead and continue with the nystatin powder I think this has been beneficial for him in the past when he will use it. 2. I would also suggest that he continue to utilize the alginate dressing this seems to keep things as dry as anything. 3. I am also going to suggest that we go ahead and continue to have the patient wear the Tubigrip to keep his swelling under control at least until we can get him into vascular for his arterial study. He tells me that he is getting go after he leaves today to go get that scheduled or rather rescheduled. I think that would be a good idea. 4. He also needs to elevate his legs as much as possible to help with the edema. We are going to check into seeing whether or not the lymphedema clinic could help him in any way with the edema specifically lymphedema in his toes. 5. I had a discussion with patient today concerning the fact that if he wants to get better he is going to need to follow the recommendations. He cannot expect that he is going to get better if he is not doing what is necessary to help take care of himself. He states he understands. Nonetheless this is good to mean going to me that he has to follow the directions that we give him and not skip out or not do it due to multiple excuses or reasons. He states he understands. We will see patient back for reevaluation in 1 week here in the clinic. If anything worsens or changes patient will contact our office for additional recommendations. Electronic Signature(s) Signed: 12/05/2018  8:54:10 AM By: Worthy Keeler PA-C Entered By: Worthy Keeler on  12/05/2018 08:54:10 Dock, [name] ([id number]) -------------------------------------------------------------------------------- ROS/PFSH Details Patient Name: Demos, Arish E. Date of Service: 12/05/2018 8:15 AM Medical Record Number: [id number] Patient Account Number: [id number] Date of Birth/Sex: 10-09-1950 (68 y.o. M) Treating RN: Montey Hora Primary Care Provider: Lamonte Sakai Other Clinician: Referring Provider: Lamonte Sakai Treating Provider/Extender: Melburn Hake, HOYT Weeks in Treatment: 115 Information Obtained From Patient Constitutional Symptoms (General Health) Complaints and Symptoms: Negative for: Fatigue; Fever; Chills; Marked Weight Change Respiratory Complaints and Symptoms: Negative for: Chronic or frequent coughs; Shortness of Breath Medical History: Negative for: Aspiration; Asthma; Chronic Obstructive Pulmonary Disease (COPD); Pneumothorax; Sleep Apnea; Tuberculosis Cardiovascular Complaints and Symptoms: Positive for: LE edema Negative for: Chest pain Medical History: Positive for: Congestive Heart Failure; Hypertension; Peripheral Venous Disease Negative for: Angina; Arrhythmia; Coronary Artery Disease; Deep Vein Thrombosis; Hypotension; Myocardial Infarction; Peripheral Arterial Disease; Phlebitis; Vasculitis Psychiatric Complaints and Symptoms: Negative for: Anxiety; Claustrophobia Eyes Medical History: Negative for: Cataracts; Glaucoma; Optic Neuritis Ear/Nose/Mouth/Throat Medical History: Negative for: Chronic sinus problems/congestion; Middle ear problems Hematologic/Lymphatic Medical History: Positive for: Anemia; Lymphedema Negative for: Hemophilia; Human Immunodeficiency Virus; Sickle Cell Disease Gastrointestinal Rieger, Cristina E. ([id number]) Medical History: Negative for: Cirrhosis ; Colitis; Crohnos; Hepatitis A; Hepatitis B; Hepatitis C Endocrine Medical History: Positive for: Type II Diabetes Treated with: Oral agents Blood sugar  tested every day: Yes Tested : QD Genitourinary Medical History: Negative for: End Stage Renal Disease Immunological Medical History: Negative for: Lupus Erythematosus; Raynaudos; Scleroderma Integumentary (Skin) Medical History: Negative for: History of Burn; History of pressure wounds Musculoskeletal Medical History: Negative for: Gout; Rheumatoid Arthritis; Osteoarthritis; Osteomyelitis Neurologic Medical History: Positive for: Neuropathy Negative for: Dementia; Quadriplegia; Paraplegia; Seizure Disorder Oncologic Medical History: Past Medical History Notes: 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 KEVANTE, LUNT ([id number]) Electronic Signature(s) Signed: 12/05/2018 1:01:55 PM By: Montey Hora Signed: 12/07/2018 6:59:38 PM By: Worthy Keeler PA-C Entered By: Worthy Keeler on 12/05/2018 08:52:02 Buchner, [name] ([id number]) -------------------------------------------------------------------------------- SuperBill Details Patient Name: Edgren, Bright E. Date of Service: 12/05/2018 Medical Record Number: [id number] Patient Account Number: [id number] Date of Birth/Sex: 05-24-1950 (68 y.o. 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 of skin E11.621 Type 2 diabetes mellitus with foot ulcer I87.323 Chronic venous hypertension (idiopathic) with inflammation of bilateral lower extremity I89.0 Lymphedema, not elsewhere classified F17.218 Nicotine dependence, cigarettes, 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 abuse 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, right ankle and foot Facility Procedures CPT4 Code: 10175102 Description: 58527 - WOUND CARE VISIT-LEV 5 EST PT Modifier: Quantity: 1 Physician Procedures CPT4 Code Description: [id number] 36144 - WC PHYS LEVEL 4 - EST PT ICD-10 Diagnosis Description L97.521 Non-pressure chronic ulcer of other  part of left foot limited to L97.511 Non-pressure chronic ulcer of other part of right foot limited t E11.621 Type 2  diabetes mellitus with foot ulcer I87.323 Chronic venous hypertension (idiopathic) with inflammation of bi Modifier: breakdown of s o breakdown of lateral lower e Quantity: 1 kin skin xtremity Electronic Signature(s) Signed: 12/05/2018 1:01:55 PM By: Montey Hora Signed: 12/07/2018 6:59:38 PM By: Worthy Keeler PA-C Previous Signature: 12/05/2018 8:54:25 AM Version By: Worthy Keeler PA-C Entered By: Montey Hora on 12/05/2018 09:17:00

## 2018-12-05 NOTE — Telephone Encounter (Signed)
Does the patient need a ultrasound as well to be seen. See note below.

## 2018-12-07 ENCOUNTER — Other Ambulatory Visit: Payer: Self-pay | Admitting: *Deleted

## 2018-12-07 DIAGNOSIS — D631 Anemia in chronic kidney disease: Secondary | ICD-10-CM

## 2018-12-07 DIAGNOSIS — N189 Chronic kidney disease, unspecified: Secondary | ICD-10-CM

## 2018-12-08 ENCOUNTER — Other Ambulatory Visit: Payer: Self-pay

## 2018-12-08 ENCOUNTER — Encounter (INDEPENDENT_AMBULATORY_CARE_PROVIDER_SITE_OTHER): Payer: Self-pay | Admitting: Nurse Practitioner

## 2018-12-08 ENCOUNTER — Other Ambulatory Visit: Payer: Medicare Other

## 2018-12-08 ENCOUNTER — Ambulatory Visit: Payer: Medicare Other

## 2018-12-08 ENCOUNTER — Ambulatory Visit (INDEPENDENT_AMBULATORY_CARE_PROVIDER_SITE_OTHER): Payer: Medicare Other | Admitting: Nurse Practitioner

## 2018-12-08 ENCOUNTER — Ambulatory Visit: Payer: Medicare Other | Admitting: Oncology

## 2018-12-08 VITALS — BP 174/80 | HR 69 | Resp 16 | Ht 70.0 in | Wt 180.0 lb

## 2018-12-08 DIAGNOSIS — I890 Lymphedema, not elsewhere classified: Secondary | ICD-10-CM

## 2018-12-08 DIAGNOSIS — L97509 Non-pressure chronic ulcer of other part of unspecified foot with unspecified severity: Secondary | ICD-10-CM

## 2018-12-08 DIAGNOSIS — M869 Osteomyelitis, unspecified: Secondary | ICD-10-CM

## 2018-12-08 DIAGNOSIS — E1169 Type 2 diabetes mellitus with other specified complication: Secondary | ICD-10-CM | POA: Diagnosis not present

## 2018-12-08 DIAGNOSIS — E11621 Type 2 diabetes mellitus with foot ulcer: Secondary | ICD-10-CM | POA: Diagnosis not present

## 2018-12-08 DIAGNOSIS — I10 Essential (primary) hypertension: Secondary | ICD-10-CM

## 2018-12-09 ENCOUNTER — Inpatient Hospital Stay (HOSPITAL_BASED_OUTPATIENT_CLINIC_OR_DEPARTMENT_OTHER): Payer: Medicare Other | Admitting: Oncology

## 2018-12-09 ENCOUNTER — Ambulatory Visit: Payer: Medicare Other | Admitting: Oncology

## 2018-12-09 ENCOUNTER — Inpatient Hospital Stay: Payer: Medicare Other | Attending: Oncology

## 2018-12-09 ENCOUNTER — Ambulatory Visit: Payer: Medicare Other

## 2018-12-09 ENCOUNTER — Other Ambulatory Visit: Payer: Medicare Other

## 2018-12-09 ENCOUNTER — Telehealth: Payer: Self-pay | Admitting: *Deleted

## 2018-12-09 ENCOUNTER — Inpatient Hospital Stay: Payer: Medicare Other

## 2018-12-09 ENCOUNTER — Other Ambulatory Visit: Payer: Self-pay

## 2018-12-09 VITALS — BP 204/96 | HR 58 | Temp 97.2°F | Resp 16 | Wt 180.6 lb

## 2018-12-09 DIAGNOSIS — D472 Monoclonal gammopathy: Secondary | ICD-10-CM

## 2018-12-09 DIAGNOSIS — Z79899 Other long term (current) drug therapy: Secondary | ICD-10-CM | POA: Insufficient documentation

## 2018-12-09 DIAGNOSIS — D631 Anemia in chronic kidney disease: Secondary | ICD-10-CM

## 2018-12-09 DIAGNOSIS — Z862 Personal history of diseases of the blood and blood-forming organs and certain disorders involving the immune mechanism: Secondary | ICD-10-CM

## 2018-12-09 DIAGNOSIS — I130 Hypertensive heart and chronic kidney disease with heart failure and stage 1 through stage 4 chronic kidney disease, or unspecified chronic kidney disease: Secondary | ICD-10-CM | POA: Insufficient documentation

## 2018-12-09 DIAGNOSIS — N189 Chronic kidney disease, unspecified: Secondary | ICD-10-CM | POA: Diagnosis not present

## 2018-12-09 DIAGNOSIS — D509 Iron deficiency anemia, unspecified: Secondary | ICD-10-CM | POA: Diagnosis not present

## 2018-12-09 DIAGNOSIS — D649 Anemia, unspecified: Secondary | ICD-10-CM

## 2018-12-09 LAB — CBC
HCT: 29.7 % — ABNORMAL LOW (ref 39.0–52.0)
Hemoglobin: 9.6 g/dL — ABNORMAL LOW (ref 13.0–17.0)
MCH: 30.1 pg (ref 26.0–34.0)
MCHC: 32.3 g/dL (ref 30.0–36.0)
MCV: 93.1 fL (ref 80.0–100.0)
Platelets: 155 10*3/uL (ref 150–400)
RBC: 3.19 MIL/uL — ABNORMAL LOW (ref 4.22–5.81)
RDW: 13 % (ref 11.5–15.5)
WBC: 5.6 10*3/uL (ref 4.0–10.5)
nRBC: 0 % (ref 0.0–0.2)

## 2018-12-09 LAB — IRON AND TIBC
Iron: 49 ug/dL (ref 45–182)
Saturation Ratios: 19 % (ref 17.9–39.5)
TIBC: 254 ug/dL (ref 250–450)
UIBC: 205 ug/dL

## 2018-12-09 LAB — FERRITIN: Ferritin: 149 ng/mL (ref 24–336)

## 2018-12-09 NOTE — Telephone Encounter (Signed)
Pt did not take his b/p pill this am. He like to eat and then take it and he did not get up in time to do all of that. His b/p was 204/96 and Dr. Janese Banks got someone else to check it and it was still elevated. Dr. Janese Banks requests that he go to the ER. The patient states he does not feel bad and he wants to go home. I did counsel pt. On if he has blurred vision, HA, chest pain. Numbness or pain to his extremities, slurred speech, drooping on one side of mouth and not the other that he should immediately go to ER. Also advised pt . The option from MD was to go to ER. He understands and he is still going home.

## 2018-12-09 NOTE — Progress Notes (Signed)
Pt did not take b/p pill today and b/p is elevated

## 2018-12-09 NOTE — Telephone Encounter (Signed)
I called pt's cousin Theodoro Doing as well as Theodoro Doing drives pt to all his appts. I tried calling pt first but Theodoro Doing states he does not have his phone right now. I asked if pt could come tomorrow and try getting his injection and giving enough time for pt. To take pill and start working . Theodoro Doing agreeable to coming 9/2 11:30. He will run by after work and tell the pt. The plan

## 2018-12-10 ENCOUNTER — Other Ambulatory Visit: Payer: Self-pay

## 2018-12-10 ENCOUNTER — Inpatient Hospital Stay: Payer: Medicare Other

## 2018-12-10 VITALS — BP 138/71 | HR 71

## 2018-12-10 DIAGNOSIS — D509 Iron deficiency anemia, unspecified: Secondary | ICD-10-CM

## 2018-12-10 DIAGNOSIS — D472 Monoclonal gammopathy: Secondary | ICD-10-CM | POA: Diagnosis not present

## 2018-12-10 MED ORDER — EPOETIN ALFA-EPBX 40000 UNIT/ML IJ SOLN
40000.0000 [IU] | Freq: Once | INTRAMUSCULAR | Status: AC
  Administered 2018-12-10: 12:00:00 40000 [IU] via SUBCUTANEOUS
  Filled 2018-12-10: qty 1

## 2018-12-10 MED ORDER — EPOETIN ALFA-EPBX 40000 UNIT/ML IJ SOLN: 50000.0000 [IU] | Freq: Once | INTRAMUSCULAR | Status: DC

## 2018-12-10 MED ORDER — EPOETIN ALFA-EPBX 10000 UNIT/ML IJ SOLN
10000.0000 [IU] | Freq: Once | INTRAMUSCULAR | Status: AC
  Administered 2018-12-10: 12:00:00 10000 [IU] via SUBCUTANEOUS
  Filled 2018-12-10: qty 1

## 2018-12-10 NOTE — Progress Notes (Signed)
BP today 138/71  Larene Beach, PharmD

## 2018-12-11 ENCOUNTER — Encounter: Payer: Medicare Other | Attending: Physician Assistant | Admitting: Physician Assistant

## 2018-12-11 ENCOUNTER — Encounter (INDEPENDENT_AMBULATORY_CARE_PROVIDER_SITE_OTHER): Payer: Self-pay | Admitting: Nurse Practitioner

## 2018-12-11 DIAGNOSIS — L97521 Non-pressure chronic ulcer of other part of left foot limited to breakdown of skin: Secondary | ICD-10-CM | POA: Insufficient documentation

## 2018-12-11 DIAGNOSIS — E11621 Type 2 diabetes mellitus with foot ulcer: Secondary | ICD-10-CM | POA: Insufficient documentation

## 2018-12-11 DIAGNOSIS — L97511 Non-pressure chronic ulcer of other part of right foot limited to breakdown of skin: Secondary | ICD-10-CM | POA: Diagnosis not present

## 2018-12-11 NOTE — Progress Notes (Signed)
SUBJECTIVE:  Patient ID: [name], male    DOB: 1951-04-04, 68 y.o.   MRN: [id number] Chief Complaint  Patient presents with  . Follow-up    right foot swelling and peeling    HPI  [name] is a 68 y.o. male that presents today due to swelling of his right lower extremity.  His foot is swelling as well as peeling on the top portion.  There is no open ulceration however there is some loss of skin integrity with continued swelling nursing will be.  He denies any fever, chills, nausea, vomiting or diarrhea.  He currently has wounds on his right toes which are being followed by the wound clinic.  He denies any fever, chills, nausea, vomiting or diarrhea.  Past Medical History:  Diagnosis Date  . Anemia   . 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 CHF (congestive heart failure) ([place])   . Chronic kidney disease   . COPD (chronic obstructive pulmonary disease) ([place])    NO INHALERS  . Diabetes mellitus    NO MEDS  . GERD (gastroesophageal reflux disease)   . Hepatic cirrhosis ([place])   . Hypertension   . Leg swelling   . Loose, teeth    PATIENT STATES "HAS 6 TEETH, SOME ARE LOOSE"  . Neuromuscular disorder ([place])    RIGHT HAND AND FINGERS NUMB  . Shortness of breath dyspnea    WITH EXERTION   . 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 APPENDECTOMY    . COLONOSCOPY WITH PROPOFOL N/A 11/19/2014   Procedure: COLONOSCOPY WITH PROPOFOL;  Surgeon: Lucilla Lame, MD;  Location: [facility];  Service: Endoscopy;  Laterality: N/A;  DIABETIC-ORAL MEDS  . COLONOSCOPY WITH PROPOFOL N/A 10/04/2016   Procedure: COLONOSCOPY WITH PROPOFOL;  Surgeon: Jonathon Bellows, MD;  Location[facility] ENDOSCOPY;  Service: Endoscopy;  Laterality: N/A;  . ESOPHAGOGASTRODUODENOSCOPY (EGD) WITH PROPOFOL N/A 10/04/2016   Procedure: ESOPHAGOGASTRODUODENOSCOPY (EGD) WITH  PROPOFOL;  Surgeon: Jonathon Bellows, MD;  Location: [facility] ENDOSCOPY;  Service: Endoscopy;  Laterality: N/A;  . GIVENS CAPSULE STUDY N/A 11/13/2016   Procedure: GIVENS CAPSULE STUDY;  Surgeon: Jonathon Bellows, MD;  Location[facility] ENDOSCOPY;  Service: Gastroenterology;  Laterality: N/A;  . INCISION AND DRAINAGE PERIRECTAL ABSCESS Right 12/16/2017   Procedure: EXCISION BASAL CELL CANCER/ GLUTEAL ABSCESS AND FLAP COVERAGE;  Surgeon: Robert Bellow, MD;  Location: [facility] ORS;  Service: General;  Laterality: Right;  . INGUINAL LYMPH NODE BIOPSY Right 01/26/2016   Procedure: INGUINAL LYMPH NODE BIOPSY;  Surgeon: Clayburn Pert, MD;  Location: [facility] ORS;  Service: General;  Laterality: Right;  . RECTAL BIOPSY N/A 11/02/2014   Procedure: BIOPSY RECTAL;  Surgeon: Marlyce Huge, MD;  Location: [facility] ORS;  Service: General;  Laterality: N/A;  . RECTAL EXAM UNDER ANESTHESIA N/A 11/02/2014   Procedure: RECTAL EXAM UNDER ANESTHESIA;  Surgeon: Marlyce Huge, MD;  Location: [facility] ORS;  Service: General;  Laterality: N/A;  . TONSILLECTOMY      Social History   Socioeconomic History  . Marital status: Divorced    Spouse name: Not on file  . Number of children: Not on file  . Years of education: Not on file  . Highest education level: Not on file  Occupational History  . Not on file  Social Needs  . Financial resource strain: Somewhat hard  . Food insecurity  Worry: Patient refused    Inability: Patient refused  . Transportation needs    Medical: Patient refused    Non-medical: Patient refused  Tobacco Use  . Smoking status: Current Every Day Smoker    Packs/day: 0.25    Years: 20.00    Pack years: 5.00    Types: Cigarettes  . Smokeless tobacco: Never Used  . Tobacco comment: 3-4 cigarettes per day  Substance and Sexual Activity  . Alcohol use: Not Currently  . Drug use: Not Currently    Comment: Last Use 2016 per patient-+ UDS FOR COCAINE IN 2014  . Sexual activity: Never  Lifestyle  .  Physical activity    Days per week: 0 days    Minutes per session: 0 min  . Stress: To some extent  Relationships  . Social connections    Talks on phone: More than three times a week    Gets together: More than three times a week    Attends religious service: More than 4 times per year    Active member of club or organization: Yes    Attends meetings of clubs or organizations: More than 4 times per year    Relationship status: Divorced  . Intimate partner violence    Fear of current or ex partner: Patient refused    Emotionally abused: Patient refused    Physically abused: Patient refused    Forced sexual activity: Patient refused  Other Topics Concern  . Not on file  Social History Narrative  . Not on file    Family History  Problem Relation Age of Onset  . Asthma Mother   . Colon cancer Neg Hx     No Known Allergies   Review of Systems   Review of Systems: Negative Unless Checked Constitutional: [] Weight loss  [] Fever  [] Chills Cardiac: [] Chest pain   []  Atrial Fibrillation  [] Palpitations   [] Shortness of breath when laying flat   [] Shortness of breath with exertion. [] Shortness of breath at rest Vascular:  [] Pain in legs with walking   [] Pain in legs with standing [] Pain in legs when laying flat   [] Claudication    [] Pain in feet when laying flat    [] History of DVT   [] Phlebitis   [x] Swelling in legs   [] Varicose veins   [x] Non-healing ulcers Pulmonary:   [] Uses home oxygen   [] Productive cough   [] Hemoptysis   [] Wheeze  [] COPD   [] Asthma Neurologic:  [] Dizziness   [] Seizures  [] Blackouts [] History of stroke   [] History of TIA  [] Aphasia   [] Temporary Blindness   [] Weakness or numbness in arm   [] Weakness or numbness in leg Musculoskeletal:   [] Joint swelling   [] Joint pain   [] Low back pain  []  History of Knee Replacement [] Arthritis [] back Surgeries  []  Spinal Stenosis    Hematologic:  [] Easy bruising  [] Easy bleeding   [] Hypercoagulable state   [x] Anemic  Gastrointestinal:  [] Diarrhea   [] Vomiting  [] Gastroesophageal reflux/heartburn   [] Difficulty swallowing. [] Abdominal pain Genitourinary:  [x] Chronic kidney disease   [] Difficult urination  [] Anuric   [] Blood in urine [] Frequent urination  [] Burning with urination   [] Hematuria Skin:  [] Rashes   [] Ulcers [] Wounds Psychological:  [] History of anxiety   []  History of major depression  []  Memory Difficulties      OBJECTIVE:   Physical Exam  BP (!) 174/80 (BP Location: Right Arm)   Pulse 69   Resp 16   Ht 5\' 10"  (1.778 m)   Wt 180 lb (  81.6 kg)   BMI 25.83 kg/m   Gen: WD/WN, NAD Head: [place]/AT, No temporalis wasting.  Ear/Nose/Throat: Hearing grossly intact, nares w/o erythema or drainage Eyes: PER, EOMI, sclera nonicteric.  Neck: Supple, no masses.  No JVD.  Pulmonary:  Good air movement, no use of accessory muscles.  Cardiac: RRR Vascular:  Unable to palpate pulses due to edema, 3+ edema right lower extremity, 1+ left wounds on right toes. Vessel Right Left  Radial Palpable Palpable   Gastrointestinal: soft, non-distended. No guarding/no peritoneal signs.  Musculoskeletal: M/S 5/5 throughout.  No deformity or atrophy.  Neurologic: Pain and light touch intact in extremities.  Symmetrical.  Speech is fluent. Motor exam as listed above. Psychiatric: Judgment intact, Mood & affect appropriate for pt's clinical situation. Dermatologic: No Venous rashes. No Ulcers Noted.  No changes consistent with cellulitis. Lymph : No Cervical lymphadenopathy, no lichenification or skin changes of chronic lymphedema.       ASSESSMENT AND PLAN:  1. Lymphedema Today we will place the patient in [place] wraps in order to gain control of the swelling and to prevent further loss of skin integrity.  The patient will follow-up on a weekly basis to have his [place] wraps changed.  We will reevaluate the patient's leg in 4 weeks in office.  Patient is instructed to keep the [place] wrap clean and dry.  The patient  will also continue with other conservative measures such as elevation of the lower extremity and exercise.  2. Essential hypertension Continue antihypertensive medications as already ordered, these medications have been reviewed and there are no changes at this time.   3. Diabetic foot ulcer with osteomyelitis ([place]) This may also be causing a portion of the patient's swelling.  As these wounds heal this should also help with the swelling.  Wounds are currently being managed by the wound center.   Current Outpatient Medications on File Prior to Visit  Medication Sig Dispense Refill  . amLODipine (NORVASC) 10 MG tablet Take 10 mg by mouth 2 (two) times daily.     Marland Kitchen atorvastatin (LIPITOR) 40 MG tablet Take 40 mg by mouth every morning.     . docusate sodium (COLACE) 100 MG capsule Take 1 capsule (100 mg total) by mouth 2 (two) times daily. 10 capsule 0  . ferrous sulfate 325 (65 FE) MG tablet Take 325 mg by mouth daily.     . furosemide (LASIX) 20 MG tablet Take 20 mg by mouth daily.     . hydrALAZINE (APRESOLINE) 25 MG tablet Take 25 mg by mouth daily.     Marland Kitchen omeprazole (PRILOSEC) 40 MG capsule Take 40 mg by mouth every morning.     . polyethylene glycol (MIRALAX / GLYCOLAX) packet Take 17 g by mouth daily as needed for mild constipation.     . sodium bicarbonate 650 MG tablet Take 1 tablet (650 mg total) by mouth 3 (three) times daily. (Patient taking differently: Take 650 mg by mouth 2 (two) times daily. ) 20 tablet 0  . torsemide (DEMADEX) 20 MG tablet Take 20 mg by mouth daily.    . vitamin B-12 (CYANOCOBALAMIN) 1000 MCG tablet Take 1 tablet (1,000 mcg total) by mouth daily. 30 tablet 3  . Vitamin D, Ergocalciferol, (DRISDOL) 50000 units CAPS capsule Take 50,000 Units by mouth every Tuesday.     . bismuth subsalicylate (PEPTO BISMOL) 262 MG/15ML suspension Take 30 mLs by mouth every 6 (six) hours as needed for indigestion or diarrhea or loose stools.  Current Facility-Administered  Medications on File Prior to Visit  Medication Dose Route Frequency Provider Last Rate Last Dose  . epoetin alfa-epbx (RETACRIT) injection 10,000 Units  10,000 Units Subcutaneous Once Sindy Guadeloupe, MD        There are no Patient Instructions on file for this visit. No follow-ups on file.   Kris Hartmann, NP  This note was completed with Sales executive.  Any errors are purely unintentional.

## 2018-12-12 ENCOUNTER — Encounter: Payer: Self-pay | Admitting: Oncology

## 2018-12-12 NOTE — Progress Notes (Signed)
Hematology/Oncology Consult note [facility]  [phone] [phone]  Patient Care Team: [name], MD as PCP - General (Internal Medicine) Sindy Guadeloupe, MD as Consulting Physician (Hematology and Oncology)   Name of the patient: [name]  [id number]  1950/05/21   Date of visit: 12/12/18  Diagnosis-  1.Anemia- multifactorial- iron deficiency, chronic disease and anemia of kidney disease  2. 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. 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 Baruch Gouty. Patient had CT abdomen in July 2016 which showed retroperitoneal, celiac and gastric adenopathy. MR pelvis in Oct 2017 showed b/l inguinal and external iliac adenopathy as well. Inguinal LN biopsy was negative for malignancy.   2. Biopsy of the skin lesion showed: DIAGNOSIS:  A. PERIANAL POLYP, LEFT; EXCISION:  - SKIN WITH POLYPOID VASCULAR PROLIFERATION WITH SUPERFICIAL NECROSIS,  SEE COMMENT.   B. PERIANAL MASS, RIGHT; INCISIONAL BIOPSY:  - INVASIVE CARCINOMA WITH BASALOID FEATURES.  - SEE COMMENT.   Comment #1:  The differential diagnosis for the perianal polyp includes pyogenic  granuloma and ulcerated hemangioma.   Comment #2:  Immunohistochemical stains were performed. The carcinoma is positive for  p16, BerEp4, and BCL-2. Stain controls worked appropriately.  Unfortunately the pattern of staining does not assist with further  classification of the carcinoma. Clinical correlation is required to  determine if the lesion involves perianal skin primarily (BCC) or is  arising in the anal canal (squamous cell carcinoma with basaloid  features).   This was followed by RT to the lesion.  3. He had repeat CT abdomen in feb  2018 which showed: IMPRESSION: Progressive hepatic cirrhosis since prior study. No evidence of hepatic neoplasm.  New moderate ascites, diffuse mesenteric and body wall edema, and small bilateral pleural effusions. Mild diffuse small bowel wall thickening, consistent with hypoalbuminemia.  Findings consistent with portal venous hypertension, including mild splenomegaly and upper abdominal venous collaterals.  Stable mild abdominal lymphadenopathy. Mild increase in bilateral iliac and inguinal lymphadenopathy in the pelvis, which may be reactive in etiology, with metastatic disease considered less Likely.  4. Heunderwentegd and colonoscopy for his microcytic anemiawhich showed no overt bleeding lesions or malignancy  5. CBC on 09/18/2016 showed H&H of 6.5/19.8 and MCV of 86.7. Labs suggestive of iron deficiency anemia. B12 and folate was within normal limits. Multiple myeloma panel showed IGG monoclonal protein of 0.9 gm. Polyclonal gammopathyHaptoglobin and no copper was normal. Reticulocyte count was mildly elevated at 7.9. Patient received 2 doses of ferriheme on 620 and 10/03/2016. He also received 1 unit of blood transfusion  6. EGD showed a normal duodenum and normal esophagus. Found to have gastritis which was biopsied. Also found to have a large polypoid mass in the left piriform sinus and nonobstructing the airway. This was subsequently evaluated by ENT which did not find any evidence of mass. The colonoscopy showed appendixthat was appearing protruding and ordered this blood with stool. No mucus seen in the vicinity. He was evaluated by a [facility] and has been considered for a repeat colonoscopy versus endoscopy mucosal resection of the mucocele  7.Bonemarrowbiopsy showed hypercellular marrow for age. 9% plasma cells. Clonality of cells could not be evaluated because of lack of clot sections available for  staining. Cytogenetics were normal. FISH studies were  negative for any chromosomal abnormalities as well.  8. Patient received procrit for anemia of chronic kidney disease with improvement of his anemia to 10   Interval history-overall patient feels well and denies any complaints at this time and then mild chronic fatigue.  He did not take his blood pressure medications this morning.  ECOG PS- 1 Pain scale- 0   Review of systems- Review of Systems  Constitutional: Positive for malaise/fatigue. Negative for chills, fever and weight loss.  HENT: Negative for congestion, ear discharge and nosebleeds.   Eyes: Negative for blurred vision.  Respiratory: Negative for cough, hemoptysis, sputum production, shortness of breath and wheezing.   Cardiovascular: Negative for chest pain, palpitations, orthopnea and claudication.  Gastrointestinal: Negative for abdominal pain, blood in stool, constipation, diarrhea, heartburn, melena, nausea and vomiting.  Genitourinary: Negative for dysuria, flank pain, frequency, hematuria and urgency.  Musculoskeletal: Negative for back pain, joint pain and myalgias.  Skin: Negative for rash.  Neurological: Negative for dizziness, tingling, focal weakness, seizures, weakness and headaches.  Endo/Heme/Allergies: Does not bruise/bleed easily.  Psychiatric/Behavioral: Negative for depression and suicidal ideas. The patient does not have insomnia.     No Known Allergies   Past Medical History:  Diagnosis Date  . Anemia   . Cancer ([place]) 11/02/2014   PERIANAL MASS, RIGHT; INCISIONAL BIOPSY: INVASIVE CARCINOMA WITH BASALOID FEATURES  . CHF (congestive heart failure) ([place])   . Chronic kidney disease   . COPD (chronic obstructive pulmonary disease) ([place])    NO INHALERS  . Diabetes mellitus    NO MEDS  . GERD (gastroesophageal reflux disease)   . Hepatic cirrhosis ([place])   . Hypertension   . Leg swelling   . Loose, teeth    PATIENT STATES "HAS 6 TEETH, SOME ARE LOOSE"  . Neuromuscular disorder ([place])    RIGHT  HAND AND FINGERS NUMB  . Shortness of breath dyspnea    WITH EXERTION   . 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 02/24/2016     Past Surgical History:  Procedure Laterality Date  . APPENDECTOMY    . COLONOSCOPY WITH PROPOFOL N/A 11/19/2014   Procedure: COLONOSCOPY WITH PROPOFOL;  Surgeon: Lucilla Lame, MD;  Location: [facility];  Service: Endoscopy;  Laterality: N/A;  DIABETIC-ORAL MEDS  . COLONOSCOPY WITH PROPOFOL N/A 10/04/2016   Procedure: COLONOSCOPY WITH PROPOFOL;  Surgeon: Jonathon Bellows, MD;  Location[facility] ENDOSCOPY;  Service: Endoscopy;  Laterality: N/A;  . ESOPHAGOGASTRODUODENOSCOPY (EGD) WITH PROPOFOL N/A 10/04/2016   Procedure: ESOPHAGOGASTRODUODENOSCOPY (EGD) WITH PROPOFOL;  Surgeon: Jonathon Bellows, MD;  Location: [facility] ENDOSCOPY;  Service: Endoscopy;  Laterality: N/A;  . GIVENS CAPSULE STUDY N/A 11/13/2016   Procedure: GIVENS CAPSULE STUDY;  Surgeon: Jonathon Bellows, MD;  Location: [facility] ENDOSCOPY;  Service: Gastroenterology;  Laterality: N/A;  . INCISION AND DRAINAGE PERIRECTAL ABSCESS Right 12/16/2017   Procedure: EXCISION BASAL CELL CANCER/ GLUTEAL ABSCESS AND FLAP COVERAGE;  Surgeon: Robert Bellow, MD;  Location: [facility] ORS;  Service: General;  Laterality: Right;  . INGUINAL LYMPH NODE BIOPSY Right 01/26/2016   Procedure: INGUINAL LYMPH NODE BIOPSY;  Surgeon: Clayburn Pert, MD;  Location: [facility] ORS;  Service: General;  Laterality: Right;  . RECTAL BIOPSY N/A 11/02/2014   Procedure: BIOPSY RECTAL;  Surgeon: Marlyce Huge, MD;  Location: [facility] ORS;  Service: General;  Laterality: N/A;  . RECTAL EXAM UNDER ANESTHESIA N/A 11/02/2014   Procedure: RECTAL EXAM UNDER ANESTHESIA;  Surgeon: Marlyce Huge, MD;  Location: [facility] ORS;  Service: General;  Laterality: N/A;  . TONSILLECTOMY      Social History   Socioeconomic History  . Marital status: Divorced    Spouse name: Not on file  . Number of children: Not on file  . Years of education: Not on file  . Highest  education level: Not on file  Occupational History  . Not on file  Social Needs  . Financial resource strain: Somewhat hard  . Food insecurity    Worry: Patient refused    Inability: Patient refused  . Transportation needs    Medical: Patient refused    Non-medical: Patient refused  Tobacco Use  . Smoking status: Current Every Day Smoker    Packs/day: 0.25    Years: 20.00    Pack years: 5.00    Types: Cigarettes  . Smokeless tobacco: Never Used  . Tobacco comment: 3-4 cigarettes per day  Substance and Sexual Activity  . Alcohol use: Not Currently  . Drug use: Not Currently    Comment: Last Use 2016 per patient-+ UDS FOR COCAINE IN 2014  . Sexual activity: Never  Lifestyle  . Physical activity    Days per week: 0 days    Minutes per session: 0 min  . Stress: To some extent  Relationships  . Social connections    Talks on phone: More than three times a week    Gets together: More than three times a week    Attends religious service: More than 4 times per year    Active member of club or organization: Yes    Attends meetings of clubs or organizations: More than 4 times per year    Relationship status: Divorced  . Intimate partner violence    Fear of current or ex partner: Patient refused    Emotionally abused: Patient refused    Physically abused: Patient refused    Forced sexual activity: Patient refused  Other Topics Concern  . Not on file  Social History Narrative  . Not on file    Family History  Problem Relation Age of Onset  . Asthma Mother   . Colon cancer Neg Hx      Current Outpatient Medications:  .  amLODipine (NORVASC) 10 MG tablet, Take 10 mg by mouth 2 (two) times daily. , Disp: , Rfl:  .  atorvastatin (LIPITOR) 40 MG tablet, Take 40 mg by mouth every morning. , Disp: , Rfl:  .  bismuth subsalicylate (PEPTO BISMOL) 262 MG/15ML suspension, Take 30 mLs by mouth every 6 (six) hours as needed for indigestion or diarrhea or loose stools. , Disp: , Rfl:   .  docusate sodium (COLACE) 100 MG capsule, Take 1 capsule (100 mg total) by mouth 2 (two) times daily., Disp: 10 capsule, Rfl: 0 .  ferrous sulfate 325 (65 FE) MG tablet, Take 325 mg by mouth daily. , Disp: , Rfl:  .  furosemide (LASIX) 20 MG tablet, Take 20 mg by mouth daily. , Disp: , Rfl:  .  hydrALAZINE (APRESOLINE) 25 MG tablet, Take 25 mg by mouth daily. , Disp: , Rfl:  .  omeprazole (PRILOSEC) 40 MG capsule, Take 40 mg by mouth every morning. , Disp: , Rfl:  .  polyethylene glycol (MIRALAX / GLYCOLAX) packet, Take 17 g by mouth daily as needed for mild constipation. , Disp: , Rfl:  .  sodium bicarbonate 650 MG tablet, Take 1 tablet (650 mg total) by mouth 3 (three) times daily. (Patient taking differently: Take 650 mg by mouth  2 (two) times daily. ), Disp: 20 tablet, Rfl: 0 .  vitamin B-12 (CYANOCOBALAMIN) 1000 MCG tablet, Take 1 tablet (1,000 mcg total) by mouth daily., Disp: 30 tablet, Rfl: 3 .  Vitamin D, Ergocalciferol, (DRISDOL) 50000 units CAPS capsule, Take 50,000 Units by mouth every Tuesday. , Disp: , Rfl:  .  torsemide (DEMADEX) 20 MG tablet, Take 20 mg by mouth daily., Disp: , Rfl:  No current facility-administered medications for this visit.   Facility-Administered Medications Ordered in Other Visits:  .  epoetin alfa-epbx (RETACRIT) injection 10,000 Units, 10,000 Units, Subcutaneous, Once **AND** [DISCONTINUED] epoetin alfa-epbx (RETACRIT) injection 40,000 Units, 40,000 Units, Subcutaneous, Once, Sindy Guadeloupe, MD  Physical exam:  Vitals:   12/09/18 1021  BP: (!) 204/96  Pulse: (!) 58  Resp: 16  Temp: (!) 97.2 F (36.2 C)  TempSrc: Tympanic  Weight: 180 lb 9.6 oz (81.9 kg)   Physical Exam Constitutional:      General: He is not in acute distress. HENT:     Head: Normocephalic and atraumatic.  Eyes:     Pupils: Pupils are equal, round, and reactive to light.  Neck:     Musculoskeletal: Normal range of motion.  Cardiovascular:     Rate and Rhythm: Normal rate  and regular rhythm.     Heart sounds: Normal heart sounds.  Pulmonary:     Effort: Pulmonary effort is normal.     Breath sounds: Normal breath sounds.  Abdominal:     General: Bowel sounds are normal.     Palpations: Abdomen is soft.  Skin:    General: Skin is warm and dry.  Neurological:     Mental Status: He is alert and oriented to person, place, and time.      CMP Latest Ref Rng & Units 01/24/2018  Glucose 70 - 99 mg/dL 134(H)  BUN 8 - 23 mg/dL 68(H)  Creatinine 0.61 - 1.24 mg/dL 3.28(H)  Sodium 135 - 145 mmol/L 135  Potassium 3.5 - 5.1 mmol/L 4.9  Chloride 98 - 111 mmol/L 110  CO2 22 - 32 mmol/L 19(L)  Calcium 8.9 - 10.3 mg/dL 8.3(L)  Total Protein 6.5 - 8.1 g/dL -  Total Bilirubin 0.3 - 1.2 mg/dL -  Alkaline Phos 38 - 126 U/L -  AST 15 - 41 U/L -  ALT 0 - 44 U/L -   CBC Latest Ref Rng & Units 12/09/2018  WBC 4.0 - 10.5 K/uL 5.6  Hemoglobin 13.0 - 17.0 g/dL 9.6(L)  Hematocrit 39.0 - 52.0 % 29.7(L)  Platelets 150 - 400 K/uL 155     Assessment and plan- Patient is a 68 y.o. male with anemia multifactorial secondary to iron deficiency as well as anemia of chronic kidney disease and MGUS.    He comes today for routine follow-up of anemia  1.  Patient did receive blood transfusion about 10 days ago when his hemoglobin dropped down to less than 7.  He has been requiring blood transfusions roughly every other month.  Today his hemoglobin is 9.6 and it would be okay for him to continue with Retacrit every 3 weeks.  However his blood pressure is elevated with a systolic blood pressure of 200.  Patient states he did not take his blood pressure medication today.  I have advised him to go to the ER given his elevated blood pressure but he chooses to go home and take his blood pressure medication and instead.  Patient understands the risks of uncontrolled blood pressure including all but  not limited to headaches, dizziness, heart attacks and strokes.  He will not be getting his Aranesp  today because of his elevated blood pressure.  He will return to clinic in a few days time and if his blood pressure systolic is less than 297 he can proceed with Retacrit at that time.  I will see him back in 12 weeks time with CBC ferritin and iron studies.  IgG lambda MGUS: He had his labs last checked in May 2020 when his M protein was stable around 1.1.  I will continue to monitor this   Visit Diagnosis 1. Anemia of chronic renal failure, unspecified CKD stage   2. MGUS (monoclonal gammopathy of unknown significance)      Dr. Randa Evens, MD, MPH [facility] [phone] 12/12/2018 8:20 AM

## 2018-12-13 NOTE — Progress Notes (Signed)
SOLOMAN, MCKEITHAN ([id number]) Visit Report for 12/11/2018 Arrival Information Details Patient Name: Asare, VITALY WANAT. Date of Service: 12/11/2018 8:45 AM Medical Record Number: [id number] Patient Account Number: [id number] Date of Birth/Sex: [birth year] (68 y.o. M) Treating RN: Army Melia Primary Care Dyesha Henault: Lamonte Sakai Other Clinician: Referring Vickye Astorino: Lamonte Sakai Treating Leeona Mccardle/Extender: Melburn Hake, HOYT Weeks in Treatment: 52 Visit Information History Since Last Visit Added or deleted any medications: No Patient Arrived: Ambulatory Any new allergies or adverse reactions: No Arrival Time: 08:57 Had a fall or experienced change in No Accompanied By: self activities of daily living that may affect Transfer Assistance: None risk of falls: Patient Identification Verified: Yes Signs or symptoms of abuse/neglect since last visito No Secondary Verification Process Completed: Yes Hospitalized since last visit: No Patient Requires Transmission-Based No Implantable device outside of the clinic excluding No Precautions: cellular tissue based products placed in the center Patient Has Alerts: Yes since last visit: Patient Alerts: DMII Has Dressing in Place as Prescribed: Yes Pain Present Now: No Electronic Signature(s) Signed: 12/11/2018 3:22:37 PM By: Lorine Bears RCP, RRT, CHT Entered By: Becky Sax, Amado Nash on 12/11/2018 08:59:08 Jenson, [name] ([id number]) -------------------------------------------------------------------------------- Clinic Level of Care Assessment Details Patient Name: Snelson, Rawlins E. Date of Service: 12/11/2018 8:45 AM Medical Record Number: [id number] Patient Account Number: [id number] Date of Birth/Sex: [birth year] (68 y.o. M) Treating RN: Army Melia Primary Care Jalana Moore: Lamonte Sakai Other Clinician: Referring Saranya Harlin: Lamonte Sakai Treating Nashayla Telleria/Extender: Melburn Hake, HOYT Weeks in Treatment: 115 Clinic Level of Care  Assessment Items TOOL 4 Quantity Score []  - Use when only an EandM is performed on FOLLOW-UP visit 0 ASSESSMENTS - Nursing Assessment / Reassessment X - Reassessment of Co-morbidities (includes updates in patient status) 1 10 X- 1 5 Reassessment of Adherence to Treatment Plan ASSESSMENTS - Wound and Skin Assessment / Reassessment []  - Simple Wound Assessment / Reassessment - one wound 0 X- 6 5 Complex Wound Assessment / Reassessment - multiple wounds []  - 0 Dermatologic / Skin Assessment (not related to wound area) ASSESSMENTS - Focused Assessment []  - Circumferential Edema Measurements - multi extremities 0 []  - 0 Nutritional Assessment / Counseling / Intervention []  - 0 Lower Extremity Assessment (monofilament, tuning fork, pulses) []  - 0 Peripheral Arterial Disease Assessment (using hand held doppler) ASSESSMENTS - Ostomy and/or Continence Assessment and Care []  - Incontinence Assessment and Management 0 []  - 0 Ostomy Care Assessment and Management (repouching, etc.) PROCESS - Coordination of Care X - Simple Patient / Family Education for ongoing care 1 15 []  - 0 Complex (extensive) Patient / Family Education for ongoing care []  - 0 Staff obtains Programmer, systems, Records, Test Results / Process Orders []  - 0 Staff telephones HHA, Nursing Homes / Clarify orders / etc []  - 0 Routine Transfer to another Facility (non-emergent condition) []  - 0 Routine Hospital Admission (non-emergent condition) []  - 0 New Admissions / Biomedical engineer / Ordering NPWT, Apligraf, etc. []  - 0 Emergency Hospital Admission (emergent condition) X- 1 10 Simple Discharge Coordination Lingo, Neeraj E. ([id number]) []  - 0 Complex (extensive) Discharge Coordination PROCESS - Special Needs []  - Pediatric / Minor Patient Management 0 []  - 0 Isolation Patient Management []  - 0 Hearing / Language / Visual special needs []  - 0 Assessment of Community assistance (transportation, D/C planning,  etc.) []  - 0 Additional assistance / Altered mentation []  - 0 Support Surface(s) Assessment (bed, cushion, seat, etc.) INTERVENTIONS - Wound Cleansing / Measurement []  - Simple Wound Cleansing - one wound  0 X- 6 5 Complex Wound Cleansing - multiple wounds X- 1 5 Wound Imaging (photographs - any number of wounds) []  - 0 Wound Tracing (instead of photographs) []  - 0 Simple Wound Measurement - one wound X- 6 5 Complex Wound Measurement - multiple wounds INTERVENTIONS - Wound Dressings []  - Small Wound Dressing one or multiple wounds 0 X- 1 15 Medium Wound Dressing one or multiple wounds []  - 0 Large Wound Dressing one or multiple wounds []  - 0 Application of Medications - topical []  - 0 Application of Medications - injection INTERVENTIONS - Miscellaneous []  - External ear exam 0 []  - 0 Specimen Collection (cultures, biopsies, blood, body fluids, etc.) []  - 0 Specimen(s) / Culture(s) sent or taken to Lab for analysis []  - 0 Patient Transfer (multiple staff / Civil Service fast streamer / Similar devices) []  - 0 Simple Staple / Suture removal (25 or less) []  - 0 Complex Staple / Suture removal (26 or more) []  - 0 Hypo / Hyperglycemic Management (close monitor of Blood Glucose) []  - 0 Ankle / Brachial Index (ABI) - do not check if billed separately X- 1 5 Vital Signs Basulto, Kayode E. ([id number]) Has the patient been seen at the hospital within the last three years: Yes Total Score: 155 Level Of Care: New/Established - Level 4 Electronic Signature(s) Signed: 12/11/2018 12:59:19 PM By: Army Melia Entered By: Army Melia on 12/11/2018 09:26:46 Taylor, Chadric Johnette Abraham ([id number]) -------------------------------------------------------------------------------- Encounter Discharge Information Details Patient Name: Fels, Kiyoshi E. Date of Service: 12/11/2018 8:45 AM Medical Record Number: [id number] Patient Account Number: [id number] Date of Birth/Sex: [birth year] (68 y.o. M) Treating RN: Army Melia Primary Care Leeona Mccardle: Lamonte Sakai Other Clinician: Referring Ashleynicole Mcclees: Lamonte Sakai Treating Corayma Cashatt/Extender: Melburn Hake, HOYT Weeks in Treatment: 115 Encounter Discharge Information Items Discharge Condition: Stable Ambulatory Status: Ambulatory Discharge Destination: Home Transportation: Private Auto Accompanied By: self Schedule Follow-up Appointment: Yes Clinical Summary of Care: Electronic Signature(s) Signed: 12/11/2018 12:59:19 PM By: Army Melia Entered By: Army Melia on 12/11/2018 09:28:31 Covell, Devion EMarland Kitchen ([id number]) -------------------------------------------------------------------------------- Lower Extremity Assessment Details Patient Name: Coulson, Kaoru E. Date of Service: 12/11/2018 8:45 AM Medical Record Number: [id number] Patient Account Number: [id number] Date of Birth/Sex: [birth year] (68 y.o. M) Treating RN: Cornell Barman Primary Care Tyesha Joffe: Lamonte Sakai Other Clinician: Referring Zuri Bradway: Lamonte Sakai Treating Jesica Goheen/Extender: Melburn Hake, HOYT Weeks in Treatment: 115 Vascular Assessment Pulses: Dorsalis Pedis Palpable: [Left:Yes] [Right:Yes] Notes Patient arrived today in a wrap on this right leg. Patient states that the vascular office put this on him Monday and that he has appointments scheduled with them for the next 4 weeks on Monday to change the wrap. That being said RN did not remove the wrap. Electronic Signature(s) Signed: 12/12/2018 10:04:45 AM By: Gretta Cool, BSN, RN, CWS, Kim RN, BSN Entered By: Gretta Cool, BSN, RN, CWS, Kim on 12/11/2018 09:14:22 Dorff, [name] ([id number]) -------------------------------------------------------------------------------- Multi Wound Chart Details Patient Name: Hao, Adoni E. Date of Service: 12/11/2018 8:45 AM Medical Record Number: [id number] Patient Account Number: [id number] Date of Birth/Sex: [birth year] (68 y.o. M) Treating RN: Army Melia Primary Care Paisleigh Maroney: Lamonte Sakai Other Clinician: Referring  Renn Dirocco: Lamonte Sakai Treating Shadasia Oldfield/Extender: Melburn Hake, HOYT Weeks in Treatment: 115 Vital Signs Height(in): 69 Pulse(bpm): 30 Weight(lbs): 168 Blood Pressure(mmHg): 152/69 Body Mass Index(BMI): 25 Temperature(F): 98.9 Respiratory Rate 16 (breaths/min): Photos: [16:No Photos] [18:No Photos] [2:No Photos] Wound Location: [16:Right, Plantar Toe Great] [18:Right, Dorsal Toe Great] [2:Right Toe Second] Wounding Event: [16:Not Known] [18:Gradually Appeared] [2:Gradually Appeared] 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] Date Acquired: [16:10/29/2017] [18:05/22/2018] [2:06/11/2016] Weeks of Treatment: [16:58] [18:29] [2:115] Wound Status: [16:Open] [18:Open] [2:Open] Pending Amputation on [16:No] [18:No] [2:Yes] Presentation: Measurements L x W x D [16:1x0.9x0.1] [18:0.1x0.1x0.1] [2:0.5x0.5x0.1] (cm) Area (cm) : [45:6.256] [18:0.008] [2:0.196] Volume (cm) : [16:0.071] [18:0.001] [2:0.02] % Reduction in Area: [16:55.90%] [18:99.00%] [2:93.60%] % Reduction in Volume: [16:55.60% Grade 1] [18:98.80% Grade 1] [2:93.50% Grade 1] Wound Number: [id number]  Photos: No Photos No Photos No Photos Wound Location: Left Toe Second Left Toe Great Right, Distal, Dorsal Foot Wounding Event: Pressure Injury Shear/Friction Gradually Appeared Primary Etiology: Diabetic Wound/Ulcer of 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 Lower Extremity Lower Extremity Date Acquired: 09/22/2018 10/13/2018 10/13/2018 Weeks of Treatment: 11 8 8  Wound Status: Open Open Open Pending Amputation on No No No Presentation: Measurements L x W x D 0.5x0.3x0.1 0.5x0.5x0.1 0.1x0.1x0.1 (cm) Area (cm) : 0.118 0.196 0.008 Volume (cm) : 0.012 0.02 0.001 % Reduction in Area: 97.00% 95.00% 99.90% % Reduction in Volume: 96.90% 94.90% 99.90% Classification: Grade 1 Grade 1  Grade 1 Gabay, Gergory E. ([id number]) Treatment Notes Electronic Signature(s) Signed: 12/11/2018 12:59:19 PM By: Army Melia Entered By: Army Melia on 12/11/2018 09:20:16 Mabile, [name] ([id number]) -------------------------------------------------------------------------------- [place] Details Patient Name: Saltz, Treshon E. Date of Service: 12/11/2018 8:45 AM Medical Record Number: [id number] Patient Account Number: [id number] Date of Birth/Sex: 08-06-1950 (68 y.o. M) Treating RN: Army Melia Primary Care Duriel Deery: Lamonte Sakai Other Clinician: Referring Sterling Mondo: Lamonte Sakai Treating Margerie Fraiser/Extender: Sharalyn Ink in Treatment: 115 Active Inactive Electronic Signature(s) Signed: 12/11/2018 1:42:43 PM By: Gretta Cool, BSN, RN, CWS, Kim RN, BSN Signed: 12/11/2018 4:01:33 PM By: Army Melia Previous Signature: 12/11/2018 12:59:19 PM Version By: Army Melia Entered By: Gretta Cool BSN, RN, CWS, Kim on 12/11/2018 13:42:42 Keetch, [name] ([id number]) -------------------------------------------------------------------------------- Pain Assessment Details Patient Name: Wion, Blanca E. Date of Service: 12/11/2018 8:45 AM Medical Record Number: [id number] Patient Account Number: [id number] Date of Birth/Sex: 1951/01/22 (68 y.o. M) Treating RN: Army Melia Primary Care Kaliah Haddaway: Lamonte Sakai Other Clinician: Referring Trevyon Swor: Lamonte Sakai Treating Meagan Ancona/Extender: Melburn Hake, HOYT Weeks in Treatment: 115 Active Problems Location of Pain Severity and Description of Pain Patient Has Paino No Site Locations Pain Management and Medication Current Pain Management: Electronic Signature(s) Signed: 12/11/2018 12:59:19 PM By: Army Melia Signed: 12/11/2018 3:22:37 PM By: Lorine Bears RCP, RRT, CHT Entered By: Lorine Bears on 12/11/2018 08:59:18 Laning, [name]  ([id number]) -------------------------------------------------------------------------------- Patient/Caregiver Education Details Patient Name: Pintor, Daeshaun E. Date of Service: 12/11/2018 8:45 AM Medical Record Number: [id number] Patient Account Number: [id number] Date of Birth/Gender: Aug 22, 1950 (68 y.o. M) Treating RN: Army Melia Primary Care Physician: Lamonte Sakai Other Clinician: Referring Physician: Lamonte Sakai Treating Physician/Extender: Sharalyn Ink in Treatment: 115 Education Assessment Education Provided To: Patient Education Topics Provided Wound/Skin Impairment: Handouts: Caring for Your Ulcer Methods: Demonstration, Explain/Verbal Responses: State content correctly Electronic Signature(s) Signed: 12/11/2018 12:59:19 PM By: Army Melia Entered By: Army Melia on 12/11/2018 09:27:02 Siedschlag, Dion E. ([id number]) -------------------------------------------------------------------------------- Wound Assessment Details Patient Name: Avitia, Ola E. Date of Service: 12/11/2018 8:45 AM Medical Record Number: [id number] Patient Account Number: [id number] Date of Birth/Sex: 1951-02-25 (68 y.o. M) Treating RN: Cornell Barman Primary Care Lauris Keepers: Lamonte Sakai Other Clinician: Referring Bonny Vanleeuwen: Lamonte Sakai Treating Dilon Lank/Extender: Melburn Hake, HOYT Weeks in Treatment: 115 Wound Status Wound Number: 16 Primary Diabetic Wound/Ulcer of the Lower Etiology: Extremity Wound Location:  Right, Plantar Toe Great Wound Status: Open Wounding Event: Not Known Date Acquired: 10/29/2017 Weeks Of Treatment: 58 Clustered Wound: No Photos Photo Uploaded By: Gretta Cool, BSN, RN, CWS, Kim on 12/11/2018 10:53:58 Wound Measurements Length: (cm) 1 Width: (cm) 0.9 Depth: (cm) 0.1 Area: (cm) 0.707 Volume: (cm) 0.071 % Reduction in Area: 55.9% % Reduction in Volume: 55.6% Wound Description Classification: Grade 1 Electronic Signature(s) Signed: 12/12/2018 10:04:45 AM By: Gretta Cool, BSN, RN,  CWS, Kim RN, BSN Entered By: Gretta Cool, BSN, RN, CWS, Kim on 12/11/2018 09:12:06 Parlier, [name] ([id number]) -------------------------------------------------------------------------------- Wound Assessment Details Patient Name: Colegrove, Vencent E. Date of Service: 12/11/2018 8:45 AM Medical Record Number: [id number] Patient Account Number: [id number] Date of Birth/Sex: [birth year] (68 y.o. M) Treating RN: Army Melia Primary Care Malcolm Quast: Lamonte Sakai Other Clinician: Referring Dewarren Ledbetter: Lamonte Sakai Treating Cassandr Cederberg/Extender: Melburn Hake, HOYT Weeks in Treatment: 115 Wound Status Wound Number: 18 Primary Diabetic Wound/Ulcer of the Lower Etiology: Extremity Wound Location: Right, Dorsal Toe Great Wound Status: Open Wounding Event: Gradually Appeared Date Acquired: 05/22/2018 Weeks Of Treatment: 29 Clustered Wound: No Photos Photo Uploaded By: Gretta Cool, BSN, RN, CWS, Kim on 12/11/2018 10:53:59 Wound Measurements Length: (cm) 0 % Red Width: (cm) 0 % Red Depth: (cm) 0 Area: (cm) 0 Volume: (cm) 0 uction in Area: 100% uction in Volume: 100% Wound Description Classification: Grade 1 Electronic Signature(s) Signed: 12/11/2018 12:59:19 PM By: Army Melia Entered By: Army Melia on 12/11/2018 09:22:57 Becknell, Sequan E. ([id number]) -------------------------------------------------------------------------------- Wound Assessment Details Patient Name: Twaddell, Daryel E. Date of Service: 12/11/2018 8:45 AM Medical Record Number: [id number] Patient Account Number: [id number] Date of Birth/Sex: 1950/09/26 (68 y.o. M) Treating RN: Army Melia Primary Care Jaythen Hamme: Lamonte Sakai Other Clinician: Referring Makaya Juneau: Lamonte Sakai Treating Karie Skowron/Extender: Melburn Hake, HOYT Weeks in Treatment: 115 Wound Status Wound Number: 2 Primary Diabetic Wound/Ulcer of the Lower Etiology: Extremity Wound Location: Right Toe Second Wound Status: Open Wounding Event: Gradually Appeared Date Acquired:  06/11/2016 Weeks Of Treatment: 115 Clustered Wound: No Pending Amputation On Presentation Photos Photo Uploaded By: Gretta Cool, BSN, RN, CWS, Kim on 12/11/2018 10:55:29 Wound Measurements Length: (cm) 0 % Red Width: (cm) 0 % Red Depth: (cm) 0 Area: (cm) 0 Volume: (cm) 0 uction in Area: 100% uction in Volume: 100% Wound Description Classification: Grade 1 Electronic Signature(s) Signed: 12/11/2018 12:59:19 PM By: Army Melia Entered By: Army Melia on 12/11/2018 09:22:58 Slevin, Jevaun E. ([id number]) -------------------------------------------------------------------------------- Wound Assessment Details Patient Name: Crance, Theon E. Date of Service: 12/11/2018 8:45 AM Medical Record Number: [id number] Patient Account Number: [id number] Date of Birth/Sex: [birth year] (68 y.o. M) Treating RN: Army Melia Primary Care Ayuub Penley: Lamonte Sakai Other Clinician: Referring Takao Lizer: Lamonte Sakai Treating Zayleigh Stroh/Extender: Melburn Hake, HOYT Weeks in Treatment: 115 Wound Status Wound Number: 20 Primary Diabetic Wound/Ulcer of the Lower Etiology: Extremity Wound Location: Left Toe Second Wound Status: Open Wounding Event: Pressure Injury Date Acquired: 09/22/2018 Weeks Of Treatment: 11 Clustered Wound: No Photos Photo Uploaded By: Gretta Cool, BSN, RN, CWS, Kim on 12/11/2018 10:55:30 Wound Measurements Length: (cm) 0 % Red Width: (cm) 0 % Red Depth: (cm) 0 Area: (cm) 0 Volume: (cm) 0 uction in Area: 100% uction in Volume: 100% Wound Description Classification: Grade 1 Electronic Signature(s) Signed: 12/11/2018 12:59:19 PM By: Army Melia Entered By: Army Melia on 12/11/2018 09:22:58 Swayze, Clent E. ([id number]) -------------------------------------------------------------------------------- Wound Assessment Details Patient Name: Kuhner, Johndavid E. Date of Service: 12/11/2018 8:45 AM Medical Record Number: [id number] Patient Account Number: [id number] Date of Birth/Sex: 1951-02-28 (68 y.o.  M) Treating RN: Army Melia  Primary Care Autumne Kallio: Lamonte Sakai Other Clinician: Referring Alicia Seib: Lamonte Sakai Treating Kiylee Thoreson/Extender: Melburn Hake, HOYT Weeks in Treatment: 115 Wound Status Wound Number: 21 Primary Diabetic Wound/Ulcer of the Lower Etiology: Extremity Wound Location: Left Toe Great Wound Status: Open Wounding Event: Shear/Friction Date Acquired: 10/13/2018 Weeks Of Treatment: 8 Clustered Wound: No Photos Photo Uploaded By: Gretta Cool, BSN, RN, CWS, Kim on 12/11/2018 10:56:16 Wound Measurements Length: (cm) 0 % R Width: (cm) 0 % R Depth: (cm) 0 Area: (cm) 0 Volume: (cm) 0 eduction in Area: 100% eduction in Volume: 100% Wound Description Classification: Grade 1 Electronic Signature(s) Signed: 12/11/2018 12:59:19 PM By: Army Melia Entered By: Army Melia on 12/11/2018 09:22:58 Kopf, Amarri E. ([id number]) -------------------------------------------------------------------------------- Wound Assessment Details Patient Name: Struckman, Kirklin E. Date of Service: 12/11/2018 8:45 AM Medical Record Number: [id number] Patient Account Number: [id number] Date of Birth/Sex: 1950-10-26 (68 y.o. M) Treating RN: Army Melia Primary Care Kimberlie Csaszar: Lamonte Sakai Other Clinician: Referring Konstantina Nachreiner: Lamonte Sakai Treating Saaya Procell/Extender: Melburn Hake, HOYT Weeks in Treatment: 115 Wound Status Wound Number: 22 Primary Diabetic Wound/Ulcer of the Lower Etiology: Extremity Wound Location: Right, Distal, Dorsal Foot Wound Status: Open Wounding Event: Gradually Appeared Date Acquired: 10/13/2018 Weeks Of Treatment: 8 Clustered Wound: No Photos Photo Uploaded By: Gretta Cool, BSN, RN, CWS, Kim on 12/11/2018 10:56:17 Wound Measurements Length: (cm) 0 % R Width: (cm) 0 % R Depth: (cm) 0 Area: (cm) 0 Volume: (cm) 0 eduction in Area: 100% eduction in Volume: 100% Wound Description Classification: Grade 1 Electronic Signature(s) Signed: 12/11/2018 12:59:19 PM By: Army Melia Entered By: Army Melia on 12/11/2018 09:22:59 Edwin, Danel E. ([id number]) -------------------------------------------------------------------------------- Vitals Details Patient Name: Braman, Gerell E. Date of Service: 12/11/2018 8:45 AM Medical Record Number: [id number] Patient Account Number: [id number] Date of Birth/Sex: Jul 30, 1950 (68 y.o. M) Treating RN: Army Melia Primary Care Haeli Gerlich: Lamonte Sakai Other Clinician: Referring Dandy Lazaro: Lamonte Sakai Treating Yani Lal/Extender: Melburn Hake, HOYT Weeks in Treatment: 115 Vital Signs Time Taken: 08:57 Temperature (F): 98.9 Height (in): 69 Pulse (bpm): 68 Weight (lbs): 168 Respiratory Rate (breaths/min): 16 Body Mass Index (BMI): 24.8 Blood Pressure (mmHg): 152/69 Reference Range: 80 - 120 mg / dl Electronic Signature(s) Signed: 12/11/2018 3:22:37 PM By: Lorine Bears RCP, RRT, CHT Entered By: Lorine Bears on 12/11/2018 08:59:52

## 2018-12-13 NOTE — Progress Notes (Signed)
KALIQ, LEGE ([id number]) Visit Report for 12/11/2018 Chief Complaint Document Details Patient Name: [name], [name] Date of Service: 12/11/2018 8:45 AM Medical Record Number: [id number] Patient Account Number: [id number] Date of Birth/Sex: 1951/01/06 (68 y.o. M) Treating RN: Army Melia Primary Care Provider: Lamonte Sakai Other Clinician: Referring Provider: Lamonte Sakai Treating Provider/Extender: Melburn Hake, Jolisa Intriago Weeks in Treatment: 115 Information Obtained from: Patient Chief Complaint Patient presents for treatment of an open diabetic ulcer to both feet Electronic Signature(s) Signed: 12/12/2018 7:48:01 PM By: Worthy Keeler PA-C Entered By: Worthy Keeler on 12/11/2018 08:58:29 Emminger, [name] ([id number]) -------------------------------------------------------------------------------- HPI Details Patient Name: [name], [name] Date of Service: 12/11/2018 8:45 AM Medical Record Number: [id number] Patient Account Number: [id number] Date of Birth/Sex: 06/02/1950 (68 y.o. M) Treating RN: Army Melia Primary Care Provider: Lamonte Sakai Other Clinician: Referring Provider: Lamonte Sakai Treating Provider/Extender: Melburn Hake, Karson Chicas Weeks in Treatment: 115 History of Present Illness HPI Description: 68 year old patient here to see as for bilateral feet ulceration to on his left first and second toe and 2 on his right first and second toe, which she's had for about 4 months. 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 Donella Stade. The patient is also undergoing workup by medical oncology for a intra-abdominal lymphadenopathy. 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 He is also status post appendectomy, inguinal lymph node biopsy, rectal biopsy and  rectal examination under anesthesia. he currently smokes cigarettes about half packet a day. In March of this year he was seen by Dr. Hortencia Pilar, for evaluation of bilateral varicose veins and besides wearing compression stockings he had recommended laser ablation of the right and left great saphenous veins to eleviate the symptoms and complications of severe superficial venous reflux disease. He also recommended lymphedema pumps for better control of his lymphedema. The patient recently has had on 08/23/2016, right greater saphenous vein ablation with the laser energy Earlier lower extremity venous reflux examination done on 05/08/2016 showed no DVT or SVT both lower legs but incompetence of bilateral great saphenous veins was present. A lower arterial study was also done and there was no significant right lower and left lower extremity problems based on a normal toe brachial index bilaterally and the ABI was 1.21 the left and 1.23 on the right. His post ablation venous duplex examination showed successful ablation of the right GS vein with thrombus formation 2 below the right saphenofemoral junction. The deep system was patent without evidence of thrombosis and this was done on 08/30/2016. 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 Addendum: regarding his x-rays done today and x-ray of the left foot -- IMPRESSION: No objective evidence of osteomyelitis. There are soft tissue changes which may reflect cellulitis. X-ray of the right foot -- IMPRESSION:Findings compatible with cellulitis of the toes. No objective evidence of osteomyelitis is observed. 10/01/16 on evaluation today patient's wounds appeared to be doing some better. I did review the x-rays as well which showed no evidence of osteomyelitis although there was evidence on x-ray of cellulitis. He fortunately is not having  any discomfort although he continues to have some swelling. He does not remember being on any antibiotics  recently. 10/15/16 on evaluation today patient's wounds overall appear to be doing better although he does have a new location noted on the left foot. Fortunately he is not having significant pain. It almost has the appearance that something is rubbing on the end of his toes but he wears the open toe shoes and according to what he is telling me never wears anything that would rub on his foot. There is no evidence of infection and specifically no evidence of a fungal infection 10/22/16 On evaluation today patient's wounds appeared to be doing better compared to last week in regard to his bilateral lower extremities. Fortunately I happy with how things are progressing although he still has ulcers I feel like that he is improving and appropriate manner. 11/12/16 on evaluation today patient appears to be doing well in regard to his bilateral feet and the respective wounds. We have been using surrounding her dressings along with an antifungal cream which seems to be doing very well. He has no bilateral dysfunction noticed that the rituals are weight loss at this point. He also has no nausea or vomiting a note purulent discharge. He did see Vein and vascular today and he tells me that they told him he could have surgery for his venous stasis but they did not feel like it was worth it in his words. Fortunately patient's wounds do appear to be getting sneakily better. [name] ([id number]) 11/26/2016 -- he says he is going to have some surgery during this week at [facility] for possibly a colon resection. 12/31/2016 -- the patient has been noncompliant with his smoking and I'm not sure whether he is also started drinking again. He continues to be very nonchalant about his care 01/14/2017 -- the patient's HandP has been reviewed well and I understand he is being compliant with trying to give up  smoking and his local dressing changes. He does not have any surgical options of 4 to him by his vascular surgeons.he was last seen in early August by Dr. Hortencia Pilar who recommended compression stockings,and possibly lymph pumps in 2-3 months after doing a review ultrasound. 01/28/2017 - the patient did not have any fresh complaints but on examination I noted a large lacerated wound on the plantar aspect of his right fourth toe which had a lot of necrotic debris and it probes down to bone. 02/07/2017 -- x-ray of the right foot -- IMPRESSION: Soft tissue swelling about the first through fourth toes consistent with cellulitis. New destructive change in the tuft of the distal phalanx of the great toe is consistent with osteomyelitis. 02/14/2017 -- the patient's MRI is pending this coming Monday and he still continues to smoke. We have again gone over off loading of his wounds in great detail and he says he's been compliant. 02/21/2017 -- MR of the right foot -- IMPRESSION: 1. Soft tissue ulcer at the tip of the first, second and third toe knows. Cortical irregularity and bone marrow edema in the first distal phalanx most concerning for osteomyelitis. Mild marrow edema in the second and third distal phalanx without definite cortical destruction which may reflect early osteomyelitis versus reactive marrow edema. 2. Soft tissue edema surrounding the first phalanx most consistent with cellulitis. the patient was also recently evaluated by his medical oncologist Dr. 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 She is treating him with weekly Procrit. She is also keeping intra-abdominal lymphadenopathy and right lower  lobe lung nodule under observation. 04/04/2017 -- he was seen by Dr. Adrian Prows on 03/25/2017 -- after review he empirically put him on ciprofloxacin and doxycycline as they have good bone penetration and good  bioavailability and it will cover the usual pathogens and diabetic foot osteomyelitis. He will check inflammatory markers and plan a 67-89 week old records. C-reactive protein was 0.3 and the ESR was 72 04/18/17 on evaluation today patient appears to be doing about the same in regard to his lower extremity wounds bilaterally. He has continued to use the antifungal cream which does seem to be beneficial. Nonetheless the ulcers do seem to in some areas be epithelial eyes over and in other areas are still open. He is having no significant discomfort. 04/25/17-he is here in follow-up evaluation for multiple ulcerations to multiple toes bilaterally. He states he did see Dr. Ola Spurr again last week and continues antibiotic therapy. He is voicing no complaints or concerns, will continue with current treatment plan will possibility of adding compression therapy next week after an additional week of treatment/lotions to BLE prescribed by Dr Ola Spurr 05/02/17 he is here in follow up for for multiple ulcers to multiple toes bilaterally. we will stop using antifungal cream and will continue with silvercel and follow up next week 05/09/17-he is here in follow-up for multiple ulcerations to multiple toes bilaterally. There is improvement in appearance. He has not completely stopped using antifungal cream, but admits he has not using it between the toes. He has an appointment with Dr. Ola Spurr on 2/11, continues on doxycycline and Cipro. It has been 5 weeks of antibiotic therapy, we will order plain film xray to evaluate for osteomyelitis next week, prior to follow up with ID. Will continue with silvercel and follow up next week 05/16/17-he is here in follow-up evaluation for multiple ulcerations to multiple toes bilaterally and new wound to the right posterior heel. There is essentially no change in appearance, deteriorating measurements; he has a history of waxing and waning measurements. He admits that he  continues to apply moisturizer/cream/ointment to his toes despite weekly reminders to only apply silvercel to his toes. He states that he thinks the surgical shoe contributed to the superficial ulcer to his posterior heel, he is unable to articulate if this was an area of dry cracked skin as he has a similar area to the left heel. He now is wearing open toed slippers. He has an appointment with Dr. Ola Spurr on 2/11. We have ordered x-rays for her bilateral feet; he was advised to obtain the x-rays today or tomorrow. He will follow-up next week 05/23/17-he is here in follow-up evaluation for multiple ulcerations to multiple toes bilaterally and the right posterior heel. There is improvement in maceration. He has been compliant and not applying any moisturizing agent to his toes. He has been using Lac-Hydrin for his lower extremities with improvement. He did not go to his appointment on Monday with Dr. Ola Spurr secondary to financial concerns. X-rays for her bilateral feet showed: LEFT FOOT with slight erosion of the tuft of the distal phalanges of the left first and second toe suspicious for osteomyelitis, RIGHT FOOT with 1.erosion of the tufts of the distal Tays, Phillippe E. ([id number]) phalanges of the right first second and possibly third toes consistent with osteomyelitis, 2 no definitive abnormality of the calcaneus is seen on the images obtained, 3. Plantar calcaneal degenerative spur. We briefly discussed hyperbaric adjunctive therapy for treatment of chronic refractory osteomyelitis. I do not find an a1c in  EMR, will contact PCP for record, or order if needed. He has been encouraged to contact Dr Ola Spurr office regarding the follow-up appointment, encouraged him to inquire about payment plan. We will continue with same treatment plan and follow-up next week. He states he is still taking antibiotics and has "a lot" left. He states he has been taking them as directed, 2 pills twice daily.  According to Dr. Blane Ohara office notes he was originally started on 12/17 for 4 weeks and extended on 1/14 for an additional 4 weeks. He should be done with his antibiotic therapy, he was advised to bring his bottles and to his next appointment, we will contact pharmacy. 05/30/17-he is here in follow-up evaluation for multiple ulcerations to multiple toes bilaterally and the right posterior heel. He is accompanied by his brother-in-law. Wounds are stable. He has yet to make up with Dr. Ola Spurr. We contacted his PCP, with no record of recent A1c we will draw an A1c. His brother-in-law states that he was taken off all of his diabetic medication secondary to kidney function. 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 The antibiotics were counted and have approximately 2 weeks left, although they should be complete. The pill organizer reveals missing days. We discussed the need for consistent medications, to have optimal benefit of medication. He has a cousin that lives with him and he will ask her to check his organizer daily. He has been advised to follow up with Dr Ola Spurr, and will go by the office today. He has been advised to quit smoking. 06/06/17-he is here in follow up evaluation. He has had to make an appointment with Dr. Ola Spurr. He did have blood work obtained, a1c 5. He continues to take antibiotic therapy. Significant improvement in bilateral lower extremity edema with compression therapy. Essentially no change in ulcerations to toes. He states he is "going to try something different" and "let me know next week" if it works; he would not provide any additional information and was encouraged to follow our orders. We will follow up next week 06/13/17-he is here in follow-up evaluation. He has an appointment with Dr. Ola Spurr tomorrow morning. He states he purchased an ointment from  [facility] and applied to his toes for 3 days, he does not remember the name of the ointment. There is improvement to his wounds, minimal maceration. He continues to take antibiotic therapy, this should have been completed last month. His brother-in-law who regularly accompanies his appointments was asked to take the bottles to the appointment tomorrow with Dr. Ola Spurr so he is aware. We will continue with 3 layer compression, and order OPEN TOE compression 20-30mmHg; we will apply compression stockings next week. He continues to smoke, smoked "2 cigarettes" last week 06/20/17 on evaluation today patient did receive his compression stockings which he has with him today for both lower extremities. With that being said he tells me at this point in time that he is very happy to have these he really is not a big fan of the compression wraps that we have been utilizing although they have been of great benefit for him. Nonetheless at this point he does want to switch to the compression stockings. In my opinion as long as he is continuing with compression I'm okay with the stockings or the wraps. 06/27/17-he is here in follow-up evaluation for multiple ulcerations to his bilateral toes. There is some improvement in appearance. He is compliant  in wearing his compression stockings with significant improvement in lower extremity edema. He saw Dr Ola Spurr on 3/8, per his notes they would redraw ESR and CRP; plan to continue antibiotic therapy if these remain elevated. I do not see an ESR or CRP level in Epic. The patient continues to take antibiotics. 07/11/17-He is here in follow-up evaluation for multiple ulcerations to multiple toes bilaterally. He presents with complete epithelialization to the right third toe; there has been no deterioration. He continues on antibiotic therapy. He will follow-up next week 07/18/17-He is here in follow-up evaluation for multiple ulcerations to multiple toes bilaterally. He  continues to make improvement. He continues on antibiotic therapy. He states he has been using something additional to our orders, he does not elaborate but states he will bring it in next week. 07/25/17-He is here in follow up evaluation for multiple ulcerations to bilateral toes. He is stable. He has completed antibiotic therapy. He admits to "filing" his toes after showers each evening, this is what he was referencing last week; he does not filing for the wounds. We will switch to [facility] and monitor for any improvement, he will follow-up next week 08/01/17-He is here in follow-up evaluation. He admits to "picking" at his toes after cleansing yesterday, leading to new areas of tissue loss on the bilateral second toe. There is improvement noted to the bilateral great toe. We will dress toes today and hope that they maintain until Monday where he will come in for a nurse visit. He has been advised, multiple times with expressed verbalization, to change the dressings to silvercel if the dressings get wet prior to [facility] appointment. 08/08/17-He is here in follow-up evaluation for bilateral first and second toe ulcerations. There is significant improvement to all ulcerations since last visit. We will switch to [name] to all wounds and he will continue with nurse visits on a Monday/Thursday schedule and follow-up with me in 2 weeks. He continues to smoke, 1-3 cigarettes per day, and has been encouraged to not smoke until his next follow-up in 2 weeks. 08/15/17 on evaluation today patient's ulcers on his toes actually appear to be doing fairly well the right to ulcers may be a little bit more moist compared to the left I'm not really sure exactly why as the openings appear to be very small and I'm not seeing any evidence of anything significant as far as you lightest or otherwise. Nonetheless this may just be a small setback he's Baskett, Braian E. ([id number]) been doing very well with the  [facility] Dressing 08/22/17-He is here in follow-up evaluation for ulcerations to his bilateral first and second toes, there is small/scant amount of drainage noted on today's dressing. He continues with Hydrofera Blue. He continues to smoke, 1 cigarette a day. Voices no complaint or concerns, compliant and compression therapy today. He'll follow-up next week 08/29/17-He is here in follow-up evaluation for ulcerations to his bilateral first and second toes, with a new wound to the left third toe. He states he cut himself while cutting the toenail. He presents today with more maceration and increased measurements; waxing and waning measurements and moisture has been an ongoing issue. He continues to smoke a proximally 1 cigarette per day, but states he has not smoked since Sunday. We will initiate medihoney daily and evaluate next week. He presents today without compression stockings 09/05/17-He is here in follow-up evaluation for ulcerations to bilateral first and second and left third toe. He did not pick up the  medihoney and therefore has not been changing his dressing. He admits to inconsistent where of compression stockings, admits to pain to his bilateral lower extremities with unilateral edema (right greater than left). The ulcerations to multiple toes are more macerated and larger in size than last week. He is wearing compression therapy today. He admits to an overall feeling of weakness and fatigue and has been encouraged to contact his PCP for evaluation; he has a known history of anemia and intraabdominal lymphadenopathy. We will return to silvercel and he will follow up next week 09/19/17-He is here in follow-up evaluation for ulcerations to bilateral first and second toe.he was unable to make last week's appointment. The culture that was obtained on 5/30 grew clindamycin sensitive MRSA; he was initiated on 6/4 but did not start it until 6/7. There is significant improvement in all  wounds, healing to left second and third toe. He also admits to having no alcohol or smoking for the last 6 days. We will continue with same treatment plan I will extend the clindamycin for an additional 10 days and he will follow-up next week. 09/26/17- He is here in follow evaluation for multiple ulcerations to multiple toes bilaterally. He continues to be non-compliant with dressing, compression therapy. He states he is taking the antibiotics as prescribed, although his recall is inconsistent. There is noted deterioration in all ulcers. We will continue same treatment plan, antibiotic therapy and he will follow up next week. 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 This information is obtained from the medical record, as he cannot articulate specifics to his diagnosis or treatment plan. 10/17/17-He is here in follow-up evaluation for multiple ulcerations to multiple toes bilaterally. There is chronic waxing and waning improvement/deterioration. On today's exam they appear improved, dry without maceration. He continues to apply a variety of topical agents, stating that he has switched between the blue product and silver product. In my opinion, this will be a chronic waxing and waning giving his intermittent compliance. He will follow-up in 2 weeks. As it relates to the biopsy taken from his buttock, he has not followed up with that and has not started radiation therapy. 10/31/17-He is here for evaluation for multiple ulcerations to multiple toes bilaterally. Continues with waxing and waning improvement versus deterioration. They are stable on today's exam without significant maceration. 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 We will continue with every 2 week follow-up appointments 11/14/17- He  is seen in follow-up evaluation for multiple ulcerations to multiple bilateral toes. He is scheduled for excision of basal cell carcinoma to his right buttock on Monday 8/12. He continues to have chronic waxing and waning of his ulcers; today is a stable day with minimal drainage and left ear is better than right toes. I will recount to infectious disease regarding chronic suppressive therapy. He admits to being more compliant with compression stockings, although he is not wearing any today. He has been encouraged to continue with dressing changes as ordered and compression therapy daily. He will be seen in 2 weeks 11/28/17-He is seen in follow up evaluation for multiple ulcerations to multiple toes bilaterally. He had an overnight admission at [facility] (8/11-8/12) for symptomatic anemia, received two units prbc. His surgical date has been moved to 9/94 excision of basal cell carcinoma to  the right buttock. He has been noncompliant in wearing compression stockings, applying topical treatment as ordered; he has been applying a "cream", Hydrofera Blue, silvercel. He is currently on no antibiotic therapy; we will send for re-referral to ID for suppressive therapy given his multiple co-morbidities, poor surgical candidate, unwilling to have toe amputation and general non-adherence. 12/19/17 on evaluation today patient actually appears to be doing very well in regard to his toes at least compared to last time I saw him. He again has been coming to our office for quite a bit of time. 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 He states is hurting a little bit but doing well in general. I'm see were not taking care of this area dermatology is. He does have his appointment with infectious disease upcoming which they re-consult for suppressive therapy that's next Monday. 01/02/18 on evaluation  today patient actually appears to be doing rather well in regard to his toe ulcers although he tells me he has been put in an ointment on this which I am concerned may be causing some additional moisture buildup which we really do not want. He supposed be using silver cell which is mainly thing I want him to be utilizing. Fortunately there does not appear to be any evidence of worsening infection which is good news. He has had surgery on his gluteal region where he had an excision of a cancerous area fortunately that seems to be doing well although he is having some discomfort. 02/06/18 on evaluation today patient actually appears to be doing well at all locations except for his second toe use bilaterally Sinor, Dunbar E. ([id number]) where the distal tip is still wrong open. He did state that he can't stand for dry skin to buildup on his toes subsequently as a question and further it sounds as if he actually pills this all which is likely keeping the toe is wrong and open. Also think that's what happened in the bottom of his foot although that appears to have healed at this point. There does not appear to be any evidence of infection. 02/20/18 on evaluation today patient appears to be doing better in regard to his foot ulcers. Everything has shown signs of improvement which is excellent news. Overall I'm very pleased with how things appear currently. The patient states he's not picking her point at any skin and that he wears shoes at all times when he is ambulating at home. Both are things that I've advocated and recommended for him in the past. 03/13/18 on evaluation today patient actually appears to be doing very well in regard to the bilateral toes in the overall appearance of the wound bed locations. Fortunately there does not appear to be any signs of infection at this time. He states that he is very pleased with how things seem to be progressing to be honest as am I. 03/27/1899 evaluation today  patient appears to be doing well in regard to his toe ulcers. He just has a few areas remaining and these appear to be showing signs of improvement as well which is great news. Overall I have been impressed with the progress of the past several weeks. 04/10/18 on evaluation today patient appears to be doing a little bit worse in regard to the right plantar foot at the regional the first metatarsal as well as the left second toe. He tells me that  he ran out of bandages he also tells me he's been wearing his flip-flops a lot at home as well as a new pair of boots that he received as well. This is as opposed to the postop shoes that we had previously given him. Nonetheless overall I feel like that he has had a little bit more friction to the area which is causing things to look a little bit worse today. 04/24/18 on evaluation today patient's wounds for the most part appear to be doing excellent especially in regard to the bilateral second toes. With that being said he does have issues with the wound still on the first metatarsal region and first toe of the right foot which have not completely closed at this point. Nonetheless I do feel like that he is making progress which is good news. 05/08/18 on evaluation today patient's wounds appear to be doing just a little bit worse than normal simply due to the fact that he has not had dressing supplies. He apparently owed $35 in order for them to be shipped and unfortunately is not able to pay that currently. We did give him information for the [employer] today which will hopefully help cover his dressing supplies. He was thankful for this he states is gonna contact them this afternoon. Fortunately otherwise or does not hear any signs of infection. 05/22/18 on evaluation today patient appears to be doing somewhat poorly in regard to his toes. Upon further questioning it appears that he actually has been pulling skin off of the toes which "drive  him crazy". Subsequently as he does this he calls his new areas to tear open and I think this is one of the reasons why his toes are not healing as appropriately as we would like. He fortunately has no signs of infection at this time. With that being said I'm still concerned about the possibility of a fungal infection even though there doesn't appear to be anything bacterial. Potentially nystatin powder could be of benefit for him as far as trying to help this in that regard. 06/05/18 on evaluation today patient actually appears to be doing very well in regard to his foot ulcers. Fortunately there's no signs of infection. He's been tolerating the dressing changes without complication. I'm very pleased with how things seem to be progressing. Fortunately there's no evidence of active infection at this point I do believe nystatin powder has been of benefit for him. 06/19/18 on evaluation today patient actually appears to be doing a little worse compared to last evaluation. Tells me that he has not had any supplies for two weeks. With that being said his plantar foot wounds on the right actually appear to be doing very well. It's actually a second toes in the first toes that are the worst. I think these are areas the was able to get to and pick and he tells me in fact that he destine to pick out some of the dry skin because it causes him annoyance. Nonetheless I think this is somewhat detrimental to him I think it's worth keeping part of the wound open in particular. Fortunately there is no sign of infection at this time although I think that is a risk which I discussed specifically that he's not careful you can end up reopening this and causing greater problems for himself. 07/03/18 on evaluation today patient's wound bed actually shows evidence of good granulation at this time and epithelialization as far as the wounds in general are concerned. He  has been tolerating the dressing changes including the  nystatin without complication. This is good news. With that being said he seems to be still having some trouble with the left second toe distally at this point. 4/9; most of the wounds look quite satisfactory. He requires debridement of the plantar right great toe otherwise everything Shughart, Rogerick E. ([id number]) looks superficial to improved. His left second toe is healed. 08/14/18 on evaluation today patient actually appears to be doing very well in regard to his foot ulcerations. In fact he just has three small areas of the right foot still remaining open the left foot is closed and dry at this point. Overall I feel like he is doing quite well which is excellent news. Fortunately there's no signs of active infection. 08/28/18 on evaluation today patient's toes of the right foot actually shows signs of being a little bit worse in regard to the overall open areas at this point. He does tell me that he "washes them well" when he is taking a shower. Again I explained that I want to make sure he's not washing them to aggressively as this could potentially cause issues with overall worsening. 09/12/18 on evaluation today patient actually appears to be doing well in regard to his right foot ulcers. He's been tolerating the dressing changes without complication. Fortunately there's no signs of active infection at this time. With that being said he does have a new one on the dorsal surface of his foot just proximal to his toes he does not know really where this came from. 09/25/18 on evaluation today patient actually appears to be doing somewhat better in regard to his right foot ulcers although he has a new opening on the left second toe unfortunately. This is something that seems to be back and forth he tells me did Pickett scan at this area that is why it's now open yet again. No fevers, chills, nausea, or vomiting noted at this time. 10/16/18 on evaluation today patient actually appears to be doing decently  poorly in regard to his ulcers on his feet. He tells me that for the past 3-4 days he is not having to take therefore is not been putting on any dressings he came in with the socks actually just stuck to the toes with a been draining. He also has not picked up the nystatin powder that was sent to the pharmacy with refills all he has to do is go pick that up he has not done so. He also told me for the more that he feels like his "blood is low" which seems to be referring to his hemoglobin [facility] and subsequently states he needs to go there when he leaves here in order to see about a blood transfusion. Nonetheless I think that's something that he definitely needs to get on top of if he feels like that's necessary. Fortunately there's no signs of active infection at this time. 10/30/18 on evaluation today patient appears to be doing a little worse in regard to his lower extremity ulcers specifically his toes for the most part although he does have an open wound on the right dorsal foot. He has been picking at his toes which damages the skin and leads to what we are seeing today. Fortunately there is no signs of active infection systemically or locally. With that being said I am concerned about the fact that I'm not sure he's ever really gonna feel as long  as he keeps messing with his toes such as picking and bothering them which causes them to reopen on a regular basis. 11/13/18 upon inspection today patient appears to be doing somewhat more poorly currently compared to the last time I saw him. He still has not been wearing his compression stockings even if he did they do not cover the toes which I think may be more of a complication problem for him to be honest. He many compression stockings that do indeed cover his toes but he also needs to have the willpower to actually wear these in a regular basis so that they actually do him any good at all. He also tells  me that he is out of dressing supplies and that he needs to go pick some up so that he will have them. Same is true of the powder although he tells me it's at the pharmacy he just has to go pick it up. 11/20/18 on evaluation today patient actually appears to be doing better with regard to his toe ulcers at this point. Fortunately he does seem to be perform the dressing changes appropriately and this has shown signs of improvement as best I can tell today. He did tell me that he attempted to use his compression stocking for about 15 minutes this morning before coming into the clinic unfortunately he tells me this caused him a lot of pain. I am not sure if there the proper size he was unable to get the new ones that we had ordered for some reason they did not get sent I am assuming this has something to do with insurance. 11/27/2018 on evaluation today patient appears to be doing okay with regard to his foot/toe sores at this time. He has been tolerating the dressing changes without complication though he tells me he does need to get some of the nystatin powder currently. With that being said he was supposed to go this morning for his arterial studies although he states he did not know where to go. He tells me that he drove around and went to several offices but not the vascular office because he did not figure "that was where he was supposed to be". Nonetheless he also had and ask exactly where he should go whenever they call to make his appointment. Nonetheless there is a lot of confusion in this regard I am getting give him the number to the vascular office for him to call and subsequently see about getting this rescheduled. 12/05/2018 on evaluation today patient appears to be doing poorly with regard to his toes. Unfortunately he is continuing to show signs of skin breakdown. He tells me in the past that he has been picking at his toes. He also has not gone back to reschedule the vascular appointment  for arterial studies. He told me today that he did not do this due to the fact that he felt like they were getting charged him upfront for the visit and he did not have the money to pay upfront. With that being said they had scheduled in the first time without making him pay upfront so that really did not make much sense. He does admit to consuming alcohol he tells me that "I had a beer last night". With that being said he also has not been using the powder he Werntz, Ladarian E. ([id number]) says for "2 weeks" with that being said I do not believe he is actually picked up the nystatin powder potentially even for 1-2 months.  We will get a call [facility] to ensure that he still has a refill and see when the last time was he picked one up. Nonetheless I believe that potentially his alcohol consumption may be interfering with his management of his health. 12/11/2018 upon evaluation today patient appears to be doing a little better with regard to his toes they seem to be drying up quite a bit which is good news. With that being said he unfortunately is continuing to have issues with picking at his toes as well as I believe his feet rubbing abnormally on his shoes which is causing areas to open. Nonetheless I believe that he may benefit from seeing a podiatrist to see if there is anything they can do to help in this regard. He did see vascular and apparently they have placed him in an Unna boot wrap for the right lower extremity. I am not sure what exactly their plans are with regard to the left lower extremity going forward. Nonetheless he has a follow-up appointment with him next week to see them on a weekly basis as well. Electronic Signature(s) Signed: 12/11/2018 1:19:28 PM By: Worthy Keeler PA-C Entered By: Worthy Keeler on 12/11/2018 13:19:27 Bondy, [name] ([id number]) -------------------------------------------------------------------------------- Physical Exam Details Patient Name: Wrench, Rook  E. Date of Service: 12/11/2018 8:45 AM Medical Record Number: [id number] Patient Account Number: [id number] Date of Birth/Sex: [birth year] (68 y.o. M) Treating RN: Army Melia Primary Care Provider: Lamonte Sakai Other Clinician: Referring Provider: Lamonte Sakai Treating Provider/Extender: Melburn Hake, Lilias Lorensen Weeks in Treatment: 32 Constitutional Well-nourished and well-hydrated in no acute distress. Respiratory normal breathing without difficulty. clear to auscultation bilaterally. Cardiovascular regular rate and rhythm with normal S1, S2. Psychiatric this patient is able to make decisions and demonstrates good insight into disease process. Alert and Oriented x 3. pleasant and cooperative. Notes Patient's wound VAC currently showed signs of actually healing well with regard to his toes in general. The right great toe did still have an area of irritation on the plantar aspect but again he does not seem to be making as good progress as I would like to imagine he should and I think a lot of this has to do with the way he is walking. I think that he may need to see a podiatrist to see if there is anything they can do to aid in this regard. Nonetheless with regard to the lymphedema he is seeing vascular for this currently and they are actively managing him. Electronic Signature(s) Signed: 12/11/2018 1:21:23 PM By: Worthy Keeler PA-C Entered By: Worthy Keeler on 12/11/2018 13:21:22 Stollings, [name] ([id number]) -------------------------------------------------------------------------------- Physician Orders Details Patient Name: Feeley, Chaske E. Date of Service: 12/11/2018 8:45 AM Medical Record Number: [id number] Patient Account Number: [id number] Date of Birth/Sex: 1950/12/28 (68 y.o. M) Treating RN: Army Melia Primary Care Provider: Lamonte Sakai Other Clinician: Referring Provider: Lamonte Sakai Treating Provider/Extender: Melburn Hake, Joann Jorge Weeks in Treatment: 115 Verbal / Phone Orders:  No Diagnosis Coding ICD-10 Coding Code Description L97.521 Non-pressure chronic ulcer of other part of left foot limited to breakdown of skin L97.511 Non-pressure chronic ulcer of other part of right foot limited to breakdown of skin E11.621 Type 2 diabetes mellitus with foot ulcer I87.323 Chronic venous hypertension (idiopathic) with inflammation of bilateral lower extremity I89.0 Lymphedema, not elsewhere classified F17.218 Nicotine dependence, cigarettes, 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 abuse 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, right ankle and foot Wound Cleansing Wound #16 Right,Plantar Toe Epifania Gore  o Clean wound with Normal Saline. Anesthetic (add to Medication List) Wound #16 Right,Plantar Toe Great o Topical Lidocaine 4% cream applied to wound bed prior to debridement (In Clinic Only). Skin Barriers/Peri-Wound Care Wound #16 Right,Plantar Toe Great o Antifungal powder-Nystatin Primary Wound Dressing Wound #16 Right,Plantar Toe Great o Silver Alginate - secure with tape Dressing Change Frequency Wound #16 Right,Plantar Toe Great o Change dressing every other day. - Continue dressing changes until follow up with podiatry Medications-please add to medication list. Wound #16 Right,Plantar Toe Great o Other: - Nystatin Powder Discharge From [facility] Services Wound #16 Right,Plantar Toe Great o Discharge from [place] - pt to follow up with podiatry Knoop, JACORIAN GOLASZEWSKI ([id number]) Consults o Podiatry - referral to podiatry Electronic Signature(s) Signed: 12/11/2018 12:59:19 PM By: Army Melia Signed: 12/12/2018 7:48:01 PM By: Worthy Keeler PA-C Entered By: Army Melia on 12/11/2018 09:27:45 Brownfield, [name] ([id number]) -------------------------------------------------------------------------------- Problem List Details Patient Name: Chiappetta, Jazz E. Date of Service: 12/11/2018 8:45 AM Medical  Record Number: [id number] Patient Account Number: [id number] Date of Birth/Sex: [birth year] (68 y.o. M) Treating RN: Army Melia Primary Care Provider: Lamonte Sakai Other Clinician: Referring Provider: Lamonte Sakai Treating Provider/Extender: Melburn Hake, Kieley Akter Weeks in Treatment: 115 Active Problems ICD-10 Evaluated Encounter Code Description Active Date Today Diagnosis L97.521 Non-pressure chronic ulcer of other part of left foot limited to 09/21/2016 No Yes breakdown of skin L97.511 Non-pressure chronic ulcer of other part of right foot limited to 09/21/2016 No Yes breakdown of skin E11.621 Type 2 diabetes mellitus with foot ulcer 09/21/2016 No Yes I87.323 Chronic venous hypertension (idiopathic) with inflammation of 09/21/2016 No Yes bilateral lower extremity I89.0 Lymphedema, not elsewhere classified 09/21/2016 No Yes F17.218 Nicotine dependence, cigarettes, with other nicotine-induced 09/21/2016 No Yes disorders F10.19 Alcohol abuse with unspecified alcohol-induced disorder 09/21/2016 No Yes M86.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 Date of Service: 12/11/2018 8:45 AM Medical Record Number: [id number] Patient Account Number: [id number] Date of Birth/Sex: 1950/09/24 (68 y.o. M) Treating RN: Army Melia Primary Care Provider: Lamonte Sakai Other Clinician: Referring Provider: Lamonte Sakai Treating Provider/Extender: Melburn Hake, Celise Bazar Weeks in Treatment: 115 Subjective Chief Complaint Information obtained from Patient Patient presents for treatment of an open diabetic ulcer to both feet History of  Present Illness (HPI) 68 year old patient here to see as for bilateral feet ulceration to on his left first and second toe and 2 on his right first and second toe, which she's had for about 4 months. 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 Donella Stade. The patient is also undergoing workup by medical oncology for a intra-abdominal lymphadenopathy. 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 He is also status post appendectomy, inguinal lymph node biopsy, rectal biopsy and rectal examination under anesthesia. he currently smokes cigarettes about half packet a day. In March of this year he was seen by Dr. Hortencia Pilar, for evaluation of bilateral varicose veins and besides wearing compression stockings he had recommended laser ablation of the right and left great saphenous veins to  eleviate the symptoms and complications of severe superficial venous reflux disease. He also recommended lymphedema pumps for better control of his lymphedema. The patient recently has had on 08/23/2016, right greater saphenous vein ablation with the laser energy Earlier lower extremity venous reflux examination done on 05/08/2016 showed no DVT or SVT both lower legs but incompetence of bilateral great saphenous veins was present. A lower arterial study was also done and there was no significant right lower and left lower extremity problems based on a normal toe brachial index bilaterally and the ABI was 1.21 the left and 1.23 on the right. His post ablation venous duplex examination showed successful ablation of the right GS vein with thrombus formation 2 below the right saphenofemoral junction. The deep system was patent without evidence of thrombosis and this was done  on 08/30/2016. 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 Addendum: regarding his x-rays done today and x-ray of the left foot -- IMPRESSION: No objective evidence of osteomyelitis. There are soft tissue changes which may reflect cellulitis. X-ray of the right foot -- IMPRESSION:Findings compatible with cellulitis of the toes. No objective evidence of osteomyelitis is observed. 10/01/16 on evaluation today patient's wounds appeared to be doing some better. I did review the x-rays as well which showed no evidence of osteomyelitis although there was evidence on x-ray of cellulitis. He fortunately is not having any discomfort although he continues to have some swelling. He does not remember being on any antibiotics recently. 10/15/16 on evaluation today patient's wounds overall appear to be doing better although he does have a new location noted on the left foot. Fortunately he is not having significant pain. It almost has the appearance that something is rubbing on the end of his toes but he wears the open toe shoes and according to what he is telling me never wears anything that would rub on his foot. There is no evidence of infection and specifically no evidence of a fungal infection 10/22/16 On evaluation today patient's wounds appeared to be doing better compared to last week in regard to his bilateral lower extremities. Fortunately I happy with how things are progressing although he still has ulcers I feel like that he is improving and appropriate manner. ABDULKADIR, EMMANUEL ([id number]) 11/12/16 on evaluation today patient appears to be doing well in regard to his bilateral feet and the respective wounds. We have been using surrounding her dressings along with an antifungal cream which seems to be doing very well. He has no bilateral dysfunction noticed that the rituals are weight loss at this  point. He also has no nausea or vomiting a note purulent discharge. He did see Vein and vascular today and he tells me that they told him he could have surgery for his venous stasis but they did not feel like it was worth it in his words. Fortunately patient's wounds do appear to be getting sneakily better. 11/26/2016 -- he says he is going to have some surgery during this week at [name] for possibly a colon resection. 12/31/2016 -- the patient has been noncompliant with his smoking and I'm not sure whether he is also started drinking again. He continues to be very nonchalant about his care 01/14/2017 -- the patient's HandP has been reviewed well and I understand he is being compliant with trying to give up smoking and his local dressing changes. He does not have  any surgical options of 4 to him by his vascular surgeons.he was last seen in early August by Dr. Hortencia Pilar who recommended compression stockings,and possibly lymph pumps in 2-3 months after doing a review ultrasound. 01/28/2017 - the patient did not have any fresh complaints but on examination I noted a large lacerated wound on the plantar aspect of his right fourth toe which had a lot of necrotic debris and it probes down to bone. 02/07/2017 -- x-ray of the right foot -- IMPRESSION: Soft tissue swelling about the first through fourth toes consistent with cellulitis. New destructive change in the tuft of the distal phalanx of the great toe is consistent with osteomyelitis. 02/14/2017 -- the patient's MRI is pending this coming Monday and he still continues to smoke. We have again gone over off loading of his wounds in great detail and he says he's been compliant. 02/21/2017 -- MR of the right foot -- IMPRESSION: 1. Soft tissue ulcer at the tip of the first, second and third toe knows. Cortical irregularity and bone marrow edema in the first distal phalanx most concerning for osteomyelitis. Mild marrow edema in the second and  third distal phalanx without definite cortical destruction which may reflect early osteomyelitis versus reactive marrow edema. 2. Soft tissue edema surrounding the first phalanx most consistent with cellulitis. the patient was also recently evaluated by his medical oncologist Dr. 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 She is treating him with weekly Procrit. She is also keeping intra-abdominal lymphadenopathy and right lower lobe lung nodule under observation. 04/04/2017 -- he was seen by Dr. Adrian Prows on 03/25/2017 -- after review he empirically put him on ciprofloxacin and doxycycline as they have good bone penetration and good bioavailability and it will cover the usual pathogens and diabetic foot osteomyelitis. He will check inflammatory markers and plan a 45-57 week old records. C-reactive protein was 0.3 and the ESR was 72 04/18/17 on evaluation today patient appears to be doing about the same in regard to his lower extremity wounds bilaterally. He has continued to use the antifungal cream which does seem to be beneficial. Nonetheless the ulcers do seem to in some areas be epithelial eyes over and in other areas are still open. He is having no significant discomfort. 04/25/17-he is here in follow-up evaluation for multiple ulcerations to multiple toes bilaterally. He states he did see Dr. Ola Spurr again last week and continues antibiotic therapy. He is voicing no complaints or concerns, will continue with current treatment plan will possibility of adding compression therapy next week after an additional week of treatment/lotions to BLE prescribed by Dr Ola Spurr 05/02/17 he is here in follow up for for multiple ulcers to multiple toes bilaterally. we will stop using antifungal cream and will continue with silvercel and follow up next week 05/09/17-he is here in follow-up for multiple ulcerations to multiple toes  bilaterally. There is improvement in appearance. He has not completely stopped using antifungal cream, but admits he has not using it between the toes. He has an appointment with Dr. Ola Spurr on 2/11, continues on doxycycline and Cipro. It has been 5 weeks of antibiotic therapy, we will order plain film xray to evaluate for osteomyelitis next week, prior to follow up with ID. Will continue with silvercel and follow up next week 05/16/17-he is here in follow-up evaluation for multiple ulcerations to multiple toes bilaterally and new wound to the right posterior heel. There is essentially  no change in appearance, deteriorating measurements; he has a history of waxing and waning measurements. He admits that he continues to apply moisturizer/cream/ointment to his toes despite weekly reminders to only apply silvercel to his toes. He states that he thinks the surgical shoe contributed to the superficial ulcer to his posterior heel, he is unable to articulate if this was an area of dry cracked skin as he has a similar area to the left heel. He now is wearing open toed slippers. He has an appointment with Dr. Ola Spurr on 2/11. We have ordered x-rays for her bilateral feet; Curnow, Emeka E. ([id number]) he was advised to obtain the x-rays today or tomorrow. He will follow-up next week 05/23/17-he is here in follow-up evaluation for multiple ulcerations to multiple toes bilaterally and the right posterior heel. There is improvement in maceration. He has been compliant and not applying any moisturizing agent to his toes. He has been using Lac-Hydrin for his lower extremities with improvement. He did not go to his appointment on Monday with Dr. Ola Spurr secondary to financial concerns. X-rays for her bilateral feet showed: LEFT FOOT with slight erosion of the tuft of the distal phalanges of the left first and second toe suspicious for osteomyelitis, RIGHT FOOT with 1.erosion of the tufts of the  distal phalanges of the right first second and possibly third toes consistent with osteomyelitis, 2 no definitive abnormality of the calcaneus is seen on the images obtained, 3. Plantar calcaneal degenerative spur. We briefly discussed hyperbaric adjunctive therapy for treatment of chronic refractory osteomyelitis. I do not find an a1c in EMR, will contact PCP for record, or order if needed. He has been encouraged to contact Dr Ola Spurr office regarding the follow-up appointment, encouraged him to inquire about payment plan. We will continue with same treatment plan and follow-up next week. He states he is still taking antibiotics and has "a lot" left. He states he has been taking them as directed, 2 pills twice daily. According to Dr. Blane Ohara office notes he was originally started on 12/17 for 4 weeks and extended on 1/14 for an additional 4 weeks. He should be done with his antibiotic therapy, he was advised to bring his bottles and to his next appointment, we will contact pharmacy. 05/30/17-he is here in follow-up evaluation for multiple ulcerations to multiple toes bilaterally and the right posterior heel. He is accompanied by his brother-in-law. Wounds are stable. He has yet to make up with Dr. Ola Spurr. We contacted his PCP, with no record of recent A1c we will draw an A1c. His brother-in-law states that he was taken off all of his diabetic medication secondary to kidney function. 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 The antibiotics were counted and have approximately 2 weeks left, although they should be complete. The pill organizer reveals missing days. We discussed the need for consistent medications, to have optimal benefit of medication. He has a cousin that lives with him and he will ask her to check his organizer daily. He has been advised to follow up with Dr Ola Spurr, and  will go by the office today. He has been advised to quit smoking. 06/06/17-he is here in follow up evaluation. He has had to make an appointment with Dr. Ola Spurr. He did have blood work obtained, a1c 5. He continues to take antibiotic therapy. Significant improvement in bilateral lower extremity edema with compression therapy. Essentially no change in ulcerations to toes.  He states he is "going to try something different" and "let me know next week" if it works; he would not provide any additional information and was encouraged to follow our orders. We will follow up next week 06/13/17-he is here in follow-up evaluation. He has an appointment with Dr. Ola Spurr tomorrow morning. He states he purchased an ointment from [facility] and applied to his toes for 3 days, he does not remember the name of the ointment. There is improvement to his wounds, minimal maceration. He continues to take antibiotic therapy, this should have been completed last month. His brother-in-law who regularly accompanies his appointments was asked to take the bottles to the appointment tomorrow with Dr. Ola Spurr so he is aware. We will continue with 3 layer compression, and order OPEN TOE compression 20-30mmHg; we will apply compression stockings next week. He continues to smoke, smoked "2 cigarettes" last week 06/20/17 on evaluation today patient did receive his compression stockings which he has with him today for both lower extremities. With that being said he tells me at this point in time that he is very happy to have these he really is not a big fan of the compression wraps that we have been utilizing although they have been of great benefit for him. Nonetheless at this point he does want to switch to the compression stockings. In my opinion as long as he is continuing with compression I'm okay with the stockings or the wraps. 06/27/17-he is here in follow-up evaluation for multiple ulcerations to his bilateral toes.  There is some improvement in appearance. He is compliant in wearing his compression stockings with significant improvement in lower extremity edema. He saw Dr Ola Spurr on 3/8, per his notes they would redraw ESR and CRP; plan to continue antibiotic therapy if these remain elevated. I do not see an ESR or CRP level in Epic. The patient continues to take antibiotics. 07/11/17-He is here in follow-up evaluation for multiple ulcerations to multiple toes bilaterally. He presents with complete epithelialization to the right third toe; there has been no deterioration. He continues on antibiotic therapy. He will follow-up next week 07/18/17-He is here in follow-up evaluation for multiple ulcerations to multiple toes bilaterally. He continues to make improvement. He continues on antibiotic therapy. He states he has been using something additional to our orders, he does not elaborate but states he will bring it in next week. 07/25/17-He is here in follow up evaluation for multiple ulcerations to bilateral toes. He is stable. He has completed antibiotic therapy. He admits to "filing" his toes after showers each evening, this is what he was referencing last week; he does not filing for the wounds. We will switch to [facility] and monitor for any improvement, he will follow-up next week 08/01/17-He is here in follow-up evaluation. He admits to "picking" at his toes after cleansing yesterday, leading to new areas of tissue loss on the bilateral second toe. There is improvement noted to the bilateral great toe. We will dress toes today and hope that they maintain until Monday where he will come in for a nurse visit. He has been advised, multiple times with expressed verbalization, to change the dressings to silvercel if the dressings get wet prior to [facility] appointment. 08/08/17-He is here in follow-up evaluation for bilateral first and second toe ulcerations. There is significant improvement to all ulcerations  since last visit. We will switch to [facility] to all wounds and he will continue with nurse visits on a Fazzini, Rontavious E. ([id number])  Monday/Thursday schedule and follow-up with me in 2 weeks. He continues to smoke, 1-3 cigarettes per day, and has been encouraged to not smoke until his next follow-up in 2 weeks. 08/15/17 on evaluation today patient's ulcers on his toes actually appear to be doing fairly well the right to ulcers may be a little bit more moist compared to the left I'm not really sure exactly why as the openings appear to be very small and I'm not seeing any evidence of anything significant as far as you lightest or otherwise. Nonetheless this may just be a small setback he's been doing very well with the [facility] Dressing 08/22/17-He is here in follow-up evaluation for ulcerations to his bilateral first and second toes, there is small/scant amount of drainage noted on today's dressing. He continues with Hydrofera Blue. He continues to smoke, 1 cigarette a day. Voices no complaint or concerns, compliant and compression therapy today. He'll follow-up next week 08/29/17-He is here in follow-up evaluation for ulcerations to his bilateral first and second toes, with a new wound to the left third toe. He states he cut himself while cutting the toenail. He presents today with more maceration and increased measurements; waxing and waning measurements and moisture has been an ongoing issue. He continues to smoke a proximally 1 cigarette per day, but states he has not smoked since Sunday. We will initiate medihoney daily and evaluate next week. He presents today without compression stockings 09/05/17-He is here in follow-up evaluation for ulcerations to bilateral first and second and left third toe. He did not pick up the medihoney and therefore has not been changing his dressing. He admits to inconsistent where of compression stockings, admits to pain to his bilateral lower extremities  with unilateral edema (right greater than left). The ulcerations to multiple toes are more macerated and larger in size than last week. He is wearing compression therapy today. He admits to an overall feeling of weakness and fatigue and has been encouraged to contact his PCP for evaluation; he has a known history of anemia and intraabdominal lymphadenopathy. We will return to silvercel and he will follow up next week 09/19/17-He is here in follow-up evaluation for ulcerations to bilateral first and second toe.he was unable to make last week's appointment. The culture that was obtained on 5/30 grew clindamycin sensitive MRSA; he was initiated on 6/4 but did not start it until 6/7. There is significant improvement in all wounds, healing to left second and third toe. He also admits to having no alcohol or smoking for the last 6 days. We will continue with same treatment plan I will extend the clindamycin for an additional 10 days and he will follow-up next week. 09/26/17- He is here in follow evaluation for multiple ulcerations to multiple toes bilaterally. He continues to be non-compliant with dressing, compression therapy. He states he is taking the antibiotics as prescribed, although his recall is inconsistent. There is noted deterioration in all ulcers. We will continue same treatment plan, antibiotic therapy and he will follow up next week. 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 This information is obtained from the medical record, as he cannot articulate specifics to his diagnosis or treatment plan. 10/17/17-He is here in follow-up evaluation for multiple ulcerations to multiple toes bilaterally. There is chronic waxing and waning improvement/deterioration. On today's exam  they appear improved, dry without maceration. He  continues to apply a variety of topical agents, stating that he has switched between the blue product and silver product. In my opinion, this will be a chronic waxing and waning giving his intermittent compliance. He will follow-up in 2 weeks. As it relates to the biopsy taken from his buttock, he has not followed up with that and has not started radiation therapy. 10/31/17-He is here for evaluation for multiple ulcerations to multiple toes bilaterally. Continues with waxing and waning improvement versus deterioration. They are stable on today's exam without significant maceration. 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 We will continue with every 2 week follow-up appointments 11/14/17- He is seen in follow-up evaluation for multiple ulcerations to multiple bilateral toes. He is scheduled for excision of basal cell carcinoma to his right buttock on Monday 8/12. He continues to have chronic waxing and waning of his ulcers; today is a stable day with minimal drainage and left ear is better than right toes. I will recount to infectious disease regarding chronic suppressive therapy. He admits to being more compliant with compression stockings, although he is not wearing any today. He has been encouraged to continue with dressing changes as ordered and compression therapy daily. He will be seen in 2 weeks 11/28/17-He is seen in follow up evaluation for multiple ulcerations to multiple toes bilaterally. He had an overnight admission at [facility] (8/11-8/12) for symptomatic anemia, received two units prbc. His surgical date has been moved to 9/94 excision of basal cell carcinoma to the right buttock. He has been noncompliant in wearing compression stockings, applying topical treatment as ordered; he has been applying a "cream", Hydrofera Blue, silvercel. He is currently on no antibiotic therapy; we will send for re-referral to ID for suppressive therapy given his  multiple co-morbidities, poor surgical candidate, unwilling to have toe amputation and general non-adherence. 12/19/17 on evaluation today patient actually appears to be doing very well in regard to his toes at least compared to last time I saw him. He again has been coming to our office for quite a bit of time. 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 He states is hurting a little bit but doing well in general. I'm see were not taking care of this area dermatology is. He does have his appointment with infectious disease upcoming which they re-consult for suppressive therapy that's next Monday. 01/02/18 on evaluation today patient actually appears to be doing rather well in regard to his toe ulcers although he tells me he Wirsing, Tashawn E. ([id number]) has been put in an ointment on this which I am concerned may be causing some additional moisture buildup which we really do not want. He supposed be using silver cell which is mainly thing I want him to be utilizing. Fortunately there does not appear to be any evidence of worsening infection which is good news. He has had surgery on his gluteal region where he had an excision of a cancerous area fortunately that seems to be doing well although he is having some discomfort. 02/06/18 on evaluation today patient actually appears to be doing well at all locations except for his second toe use bilaterally where the distal tip is still wrong open. He did state that he can't stand for dry skin to buildup on his toes subsequently as a question and further  it sounds as if he actually pills this all which is likely keeping the toe is wrong and open. Also think that's what happened in the bottom of his foot although that appears to have healed at this point. There does not appear to be any evidence of infection. 02/20/18 on evaluation today patient appears  to be doing better in regard to his foot ulcers. Everything has shown signs of improvement which is excellent news. Overall I'm very pleased with how things appear currently. The patient states he's not picking her point at any skin and that he wears shoes at all times when he is ambulating at home. Both are things that I've advocated and recommended for him in the past. 03/13/18 on evaluation today patient actually appears to be doing very well in regard to the bilateral toes in the overall appearance of the wound bed locations. Fortunately there does not appear to be any signs of infection at this time. He states that he is very pleased with how things seem to be progressing to be honest as am I. 03/27/1899 evaluation today patient appears to be doing well in regard to his toe ulcers. He just has a few areas remaining and these appear to be showing signs of improvement as well which is great news. Overall I have been impressed with the progress of the past several weeks. 04/10/18 on evaluation today patient appears to be doing a little bit worse in regard to the right plantar foot at the regional the first metatarsal as well as the left second toe. He tells me that he ran out of bandages he also tells me he's been wearing his flip-flops a lot at home as well as a new pair of boots that he received as well. This is as opposed to the postop shoes that we had previously given him. Nonetheless overall I feel like that he has had a little bit more friction to the area which is causing things to look a little bit worse today. 04/24/18 on evaluation today patient's wounds for the most part appear to be doing excellent especially in regard to the bilateral second toes. With that being said he does have issues with the wound still on the first metatarsal region and first toe of the right foot which have not completely closed at this point. Nonetheless I do feel like that he is making progress which is good  news. 05/08/18 on evaluation today patient's wounds appear to be doing just a little bit worse than normal simply due to the fact that he has not had dressing supplies. He apparently owed $35 in order for them to be shipped and unfortunately is not able to pay that currently. We did give him information for the [employer] today which will hopefully help cover his dressing supplies. He was thankful for this he states is gonna contact them this afternoon. Fortunately otherwise or does not hear any signs of infection. 05/22/18 on evaluation today patient appears to be doing somewhat poorly in regard to his toes. Upon further questioning it appears that he actually has been pulling skin off of the toes which "drive him crazy". Subsequently as he does this he calls his new areas to tear open and I think this is one of the reasons why his toes are not healing as appropriately as we would like. He fortunately has no signs of infection at this time. With that being said I'm still concerned about the possibility of a fungal infection even  though there doesn't appear to be anything bacterial. Potentially nystatin powder could be of benefit for him as far as trying to help this in that regard. 06/05/18 on evaluation today patient actually appears to be doing very well in regard to his foot ulcers. Fortunately there's no signs of infection. He's been tolerating the dressing changes without complication. I'm very pleased with how things seem to be progressing. Fortunately there's no evidence of active infection at this point I do believe nystatin powder has been of benefit for him. 06/19/18 on evaluation today patient actually appears to be doing a little worse compared to last evaluation. Tells me that he has not had any supplies for two weeks. With that being said his plantar foot wounds on the right actually appear to be doing very well. It's actually a second toes in the first toes that are the  worst. I think these are areas the was able to get to and pick and he tells me in fact that he destine to pick out some of the dry skin because it causes him annoyance. Nonetheless I think this is somewhat detrimental to him I think it's worth keeping part of the wound open in particular. Fortunately there is no sign of infection at this time although I think that is a risk which I discussed specifically that he's not careful you can end up reopening this and causing greater problems for himself. GEARL, BARATTA ([id number]) 07/03/18 on evaluation today patient's wound bed actually shows evidence of good granulation at this time and epithelialization as far as the wounds in general are concerned. He has been tolerating the dressing changes including the nystatin without complication. This is good news. With that being said he seems to be still having some trouble with the left second toe distally at this point. 4/9; most of the wounds look quite satisfactory. He requires debridement of the plantar right great toe otherwise everything looks superficial to improved. His left second toe is healed. 08/14/18 on evaluation today patient actually appears to be doing very well in regard to his foot ulcerations. In fact he just has three small areas of the right foot still remaining open the left foot is closed and dry at this point. Overall I feel like he is doing quite well which is excellent news. Fortunately there's no signs of active infection. 08/28/18 on evaluation today patient's toes of the right foot actually shows signs of being a little bit worse in regard to the overall open areas at this point. He does tell me that he "washes them well" when he is taking a shower. Again I explained that I want to make sure he's not washing them to aggressively as this could potentially cause issues with overall worsening. 09/12/18 on evaluation today patient actually appears to be doing well in regard to his right  foot ulcers. He's been tolerating the dressing changes without complication. Fortunately there's no signs of active infection at this time. With that being said he does have a new one on the dorsal surface of his foot just proximal to his toes he does not know really where this came from. 09/25/18 on evaluation today patient actually appears to be doing somewhat better in regard to his right foot ulcers although he has a new opening on the left second toe unfortunately. This is something that seems to be back and forth he tells me did [place] scan at this area that is why it's now open yet again. No fevers,  chills, nausea, or vomiting noted at this time. 10/16/18 on evaluation today patient actually appears to be doing decently poorly in regard to his ulcers on his feet. He tells me that for the past 3-4 days he is not having to take therefore is not been putting on any dressings he came in with the socks actually just stuck to the toes with a been draining. He also has not picked up the nystatin powder that was sent to the pharmacy with refills all he has to do is go pick that up he has not done so. He also told me for the more that he feels like his "blood is low" which seems to be referring to his hemoglobin he [facility] and subsequently states he needs to go there when he leaves here in order to see about a blood transfusion. Nonetheless I think that's something that he definitely needs to get on top of if he feels like that's necessary. Fortunately there's no signs of active infection at this time. 10/30/18 on evaluation today patient appears to be doing a little worse in regard to his lower extremity ulcers specifically his toes for the most part although he does have an open wound on the right dorsal foot. He has been picking at his toes which damages the skin and leads to what we are seeing today. Fortunately there is no signs of active infection  systemically or locally. With that being said I am concerned about the fact that I'm not sure he's ever really gonna feel as long as he keeps messing with his toes such as picking and bothering them which causes them to reopen on a regular basis. 11/13/18 upon inspection today patient appears to be doing somewhat more poorly currently compared to the last time I saw him. He still has not been wearing his compression stockings even if he did they do not cover the toes which I think may be more of a complication problem for him to be honest. He many compression stockings that do indeed cover his toes but he also needs to have the willpower to actually wear these in a regular basis so that they actually do him any good at all. He also tells me that he is out of dressing supplies and that he needs to go pick some up so that he will have them. Same is true of the powder although he tells me it's at the pharmacy he just has to go pick it up. 11/20/18 on evaluation today patient actually appears to be doing better with regard to his toe ulcers at this point. Fortunately he does seem to be perform the dressing changes appropriately and this has shown signs of improvement as best I can tell today. He did tell me that he attempted to use his compression stocking for about 15 minutes this morning before coming into the clinic unfortunately he tells me this caused him a lot of pain. I am not sure if there the proper size he was unable to get the new ones that we had ordered for some reason they did not get sent I am assuming this has something to do with insurance. 11/27/2018 on evaluation today patient appears to be doing okay with regard to his foot/toe sores at this time. He has been tolerating the dressing changes without complication though he tells me he does need to get some of the nystatin powder currently. With that being said he was supposed  to go this morning for his arterial studies although he states he  did not know where to go. He tells me that he drove around and went to several offices but not the vascular office because he did not figure "that was where he was supposed to be". Nonetheless he also had and ask exactly where he should go whenever they call to make his appointment. Nonetheless there is a lot of confusion in this regard I am getting give him the number to the vascular office for him to call and subsequently see about getting this rescheduled. CLARICE, BONAVENTURE ([id number]) 12/05/2018 on evaluation today patient appears to be doing poorly with regard to his toes. Unfortunately he is continuing to show signs of skin breakdown. He tells me in the past that he has been picking at his toes. He also has not gone back to reschedule the vascular appointment for arterial studies. He told me today that he did not do this due to the fact that he felt like they were getting charged him upfront for the visit and he did not have the money to pay upfront. With that being said they had scheduled in the first time without making him pay upfront so that really did not make much sense. He does admit to consuming alcohol he tells me that "I had a beer last night". With that being said he also has not been using the powder he says for "2 weeks" with that being said I do not believe he is actually picked up the nystatin powder potentially even for 1-2 months. We will get a call [facility] to ensure that he still has a refill and see when the last time was he picked one up. Nonetheless I believe that potentially his alcohol consumption may be interfering with his management of his health. 12/11/2018 upon evaluation today patient appears to be doing a little better with regard to his toes they seem to be drying up quite a bit which is good news. With that being said he unfortunately is continuing to have issues with picking at his toes as well as I believe his feet rubbing abnormally on his shoes which is causing  areas to open. Nonetheless I believe that he may benefit from seeing a podiatrist to see if there is anything they can do to help in this regard. He did see vascular and apparently they have placed him in an Unna boot wrap for the right lower extremity. I am not sure what exactly their plans are with regard to the left lower extremity going forward. Nonetheless he has a follow-up appointment with him next week to see them on a weekly basis as well. Patient History Information obtained from Patient. Social History Current every day smoker, Marital Status - Widowed, Alcohol Use - Daily - quit drinking about a week ago, Drug Use - No History, Caffeine Use - Moderate. Medical History Eyes Denies history of Cataracts, Glaucoma, Optic Neuritis Ear/Nose/Mouth/Throat Denies history of Chronic sinus problems/congestion, Middle ear problems Hematologic/Lymphatic Patient has history of Anemia, Lymphedema Denies history of Hemophilia, Human Immunodeficiency Virus, Sickle Cell Disease Respiratory Denies history of Aspiration, Asthma, Chronic Obstructive Pulmonary Disease (COPD), Pneumothorax, Sleep Apnea, Tuberculosis Cardiovascular Patient has history of Congestive Heart Failure, Hypertension, Peripheral Venous Disease Denies history of Angina, Arrhythmia, Coronary Artery Disease, Deep Vein Thrombosis, Hypotension, Myocardial Infarction, Peripheral Arterial Disease, Phlebitis, Vasculitis Gastrointestinal Denies history of Cirrhosis , Colitis, Crohn s, Hepatitis A, Hepatitis B, Hepatitis C Endocrine Patient has history of Type  II Diabetes Genitourinary Denies history of End Stage Renal Disease Immunological Denies history of Lupus Erythematosus, Raynaud s, Scleroderma Integumentary (Skin) Denies history of History of Burn, History of pressure wounds Musculoskeletal Denies history of Gout, Rheumatoid Arthritis, Osteoarthritis, Osteomyelitis Neurologic Patient has history of Neuropathy Denies  history of Dementia, Quadriplegia, Paraplegia, Seizure Disorder Medical And Surgical History Notes 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 ([id number]) Review of Systems (ROS) Constitutional Symptoms (General Health) Denies complaints or symptoms of Fatigue, Fever, Chills, Marked Weight Change. Respiratory Denies complaints or symptoms of Chronic or frequent coughs, Shortness of Breath. Cardiovascular Complains or has symptoms of LE edema. Denies complaints or symptoms of Chest pain. Neurologic Denies complaints or symptoms of Numbness/parasthesias, Focal/Weakness. Psychiatric Denies complaints or symptoms of Anxiety, Claustrophobia. Objective Constitutional Well-nourished and well-hydrated in no acute distress. Vitals Time Taken: 8:57 AM, Height: 69 in, Weight: 168 lbs, BMI: 24.8, Temperature: 98.9 F, Pulse: 68 bpm, Respiratory Rate: 16 breaths/min, Blood Pressure: 152/69 mmHg. Respiratory normal breathing without difficulty. clear to auscultation bilaterally. Cardiovascular regular rate and rhythm with normal S1, S2. Psychiatric this patient is able to make decisions and demonstrates good insight into disease process. Alert and Oriented x 3. pleasant and cooperative. General Notes: Patient's wound VAC currently showed signs of actually healing well with regard to his toes in general. The right great toe did still have an area of irritation on the plantar aspect but again he does not seem to be making as good progress as I would like to imagine he should and I think a lot of this has to do with the way he is walking. I think that he may need to see a podiatrist to see if there is anything they can do to aid in this regard. Nonetheless with regard to the lymphedema he is seeing vascular for this currently and they are actively managing him. Integumentary (Hair, Skin) Wound #16 status is Open. Original cause of wound was Not Known.  The wound is located on the [employer]. The wound measures 1cm length x 0.9cm width x 0.1cm depth; 0.707cm^2 area and 0.071cm^3 volume. Wound #18 status is Open. Original cause of wound was Gradually Appeared. The wound is located on the Right,Dorsal [employer] The wound measures 0cm length x 0cm width x 0cm depth; 0cm^2 area and 0cm^3 volume. Wound #2 status is Open. Original cause of wound was Gradually Appeared. The wound is located on the Right Toe Second. The wound measures 0cm length x 0cm width x 0cm depth; 0cm^2 area and 0cm^3 volume. Wound #20 status is Open. Original cause of wound was Pressure Injury. The wound is located on the Left Toe Second. The Petraitis, Kaiel E. ([id number]) wound measures 0cm length x 0cm width x 0cm depth; 0cm^2 area and 0cm^3 volume. Wound #21 status is Open. Original cause of wound was Shear/Friction. The wound is located on the Left Toe Great. The wound measures 0cm length x 0cm width x 0cm depth; 0cm^2 area and 0cm^3 volume. Wound #22 status is Open. Original cause of wound was Gradually Appeared. The wound is located on the Right,Distal,Dorsal Foot. The wound measures 0cm length x 0cm width x 0cm depth; 0cm^2 area and 0cm^3 volume. 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 Anesthetic (add to Medication List): Wound #16 Right,Plantar Toe Great: Topical  Lidocaine 4% cream applied to wound bed prior to debridement (In Clinic Only). Skin Barriers/Peri-Wound Care: Wound #16 Right,Plantar Toe Great: Antifungal powder-Nystatin Primary Wound Dressing: Wound #16 Right,Plantar Toe Great: Silver Alginate - secure with tape Dressing Change Frequency: Wound #16 Right,Plantar Toe Great: Change dressing every other day. - Continue dressing changes until follow up with podiatry Medications-please add to medication list.: Wound #16 Right,Plantar Toe Great: Other: - Nystatin Powder [facility]charge From [facility] Services: Wound #16 Right,Plantar Toe Great: Discharge from [place] - pt to follow up with podiatry Consults ordered were: Podiatry - referral to podiatry Carlyon, Keeon E. ([id number]) 1. I would recommend at this time that we go ahead and discontinue wound care services we have been seeing him for quite a while. In fact today's visit was week 115 since we first began seeing him and again he constantly has an issue with what appears to be more of an athlete's foot irritation we have been treating this for quite some time with both topical antifungals as well as alginate dressings to keep things as dry as possible but unfortunately he is not making the progress that we would really like to see. Therefore I think he may need to see a specialist to see if there is anything they can do to help in this regard. 2. With regard to his walking also feel that he is likely rubbing his feet abnormally causing intermittent ulcerations which will come and go but again there is nothing significant at this point today and I think that as opposed to wound care he may actually need to see podiatry. 3. With regard to his lower extremity edema vascular is actually actively managing this he has appointment to see them on Monday weekly for the next 4 weeks according to what he tells me today. Subsequently we will see how things go in that regard as well. At this point  we will get a discontinue wound care services as the patient does not seem to have active wounds as much as he does just skin breakdown on his feet secondary to tinea pedis along with friction type injuries that intermittently occur as a result of his walking. I think this will likely be better managed by podiatry. Electronic Signature(s) Signed: 12/11/2018 1:22:10 PM By: Worthy Keeler PA-C Entered By: Worthy Keeler on 12/11/2018 13:22:09 [place], [name] ([id number]) -------------------------------------------------------------------------------- ROS/PFSH Details Patient Name: Petrovich, Christophere E. Date of Service: 12/11/2018 8:45 AM Medical Record Number: [id number] Patient Account Number: [id number] Date of Birth/Sex: 10/04/1950 (68 y.o. M) Treating RN: Army Melia Primary Care Provider: Lamonte Sakai Other Clinician: Referring Provider: Lamonte Sakai Treating Provider/Extender: Melburn Hake, Kamil Mchaffie Weeks in Treatment: 115 Information Obtained From Patient Constitutional Symptoms (General Health) Complaints and Symptoms: Negative for: Fatigue; Fever; Chills; Marked Weight Change Respiratory Complaints and Symptoms: Negative for: Chronic or frequent coughs; Shortness of Breath Medical History: Negative for: Aspiration; Asthma; Chronic Obstructive Pulmonary Disease (COPD); Pneumothorax; Sleep Apnea; Tuberculosis Cardiovascular Complaints and Symptoms: Positive for: LE edema Negative for: Chest pain Medical History: Positive for: Congestive Heart Failure; Hypertension; Peripheral Venous Disease Negative for: Angina; Arrhythmia; Coronary Artery Disease; Deep Vein Thrombosis; Hypotension; Myocardial Infarction; Peripheral Arterial Disease;  Phlebitis; Vasculitis Neurologic Complaints and Symptoms: Negative for: Numbness/parasthesias; Focal/Weakness Medical History: Positive for: Neuropathy Negative for: Dementia; Quadriplegia; Paraplegia; Seizure Disorder Psychiatric Complaints and  Symptoms: Negative for: Anxiety; Claustrophobia Eyes Medical History: Negative for: Cataracts; Glaucoma; Optic Neuritis Ear/Nose/Mouth/Throat ORBIN, MAYEUX ([id number]) Medical History: Negative for: Chronic sinus problems/congestion; Middle ear problems Hematologic/Lymphatic Medical History: Positive for: Anemia; Lymphedema Negative for: Hemophilia; Human Immunodeficiency Virus; Sickle Cell Disease Gastrointestinal Medical History: Negative for: Cirrhosis ; Colitis; Crohnos; Hepatitis A; Hepatitis B; Hepatitis C Endocrine Medical History: Positive for: Type II Diabetes Treated with: Oral agents Blood sugar tested every day: Yes Tested : QD Genitourinary Medical History: Negative for: End Stage Renal Disease Immunological Medical History: Negative for: Lupus Erythematosus; Raynaudos; Scleroderma Integumentary (Skin) Medical History: Negative for: History of Burn; History of pressure wounds Musculoskeletal Medical History: Negative for: Gout; Rheumatoid Arthritis; Osteoarthritis; Osteomyelitis Oncologic Medical History: Past Medical History Notes: 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 ([id number]) Current every day smoker; Marital Status - Widowed; Alcohol Use: Daily - quit drinking about a week ago; Drug Use: No History; Caffeine Use: Moderate; Financial Concerns: No; Food, Clothing or Shelter Needs: No; Support System Lacking: No; Transportation Concerns: No Physician Affirmation I have reviewed and agree with the above information. Electronic Signature(s) Signed: 12/11/2018 4:01:33 PM By: Army Melia Signed: 12/12/2018 7:48:01 PM By: Worthy Keeler PA-C Entered By: Worthy Keeler on 12/11/2018 13:19:56 Alviar, [name]  ([id number]) -------------------------------------------------------------------------------- SuperBill Details Patient Name: Ruest, Omid E. Date of Service: 12/11/2018 Medical Record Number: [id number] Patient Account Number: [id number] Date of Birth/Sex: 1951/01/21 (68 y.o. 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 of skin E11.621 Type 2 diabetes mellitus with foot ulcer I87.323 Chronic venous hypertension (idiopathic) with inflammation of bilateral lower extremity I89.0 Lymphedema, not elsewhere classified F17.218 Nicotine dependence, cigarettes, 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 abuse 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, right ankle and foot Facility Procedures CPT4 Code: 84037543 Description: 99214 - WOUND CARE VISIT-LEV 4 EST PT Modifier: Quantity: 1 Physician Procedures CPT4 Code Description: 6067703 40352 - WC PHYS LEVEL 4 - EST PT ICD-10 Diagnosis Description L97.521 Non-pressure chronic ulcer of other part of left foot limited to L97.511 Non-pressure chronic ulcer of other part of right foot limited t E11.621 Type 2  diabetes mellitus with foot ulcer I87.323 Chronic venous hypertension (idiopathic) with inflammation of bi Modifier: breakdown of s o breakdown of lateral lower e Quantity: 1 kin skin xtremity Electronic Signature(s) Signed: 12/11/2018 1:23:29 PM By: Worthy Keeler PA-C Entered By: Worthy Keeler on 12/11/2018 13:23:28

## 2018-12-17 ENCOUNTER — Other Ambulatory Visit: Payer: Self-pay

## 2018-12-17 ENCOUNTER — Ambulatory Visit (INDEPENDENT_AMBULATORY_CARE_PROVIDER_SITE_OTHER): Payer: Medicare Other | Admitting: Nurse Practitioner

## 2018-12-17 ENCOUNTER — Encounter (INDEPENDENT_AMBULATORY_CARE_PROVIDER_SITE_OTHER): Payer: Self-pay

## 2018-12-17 VITALS — BP 174/81 | HR 73 | Resp 16 | Wt 182.0 lb

## 2018-12-17 DIAGNOSIS — I890 Lymphedema, not elsewhere classified: Secondary | ICD-10-CM | POA: Diagnosis not present

## 2018-12-17 NOTE — Progress Notes (Signed)
History of Present Illness  There is no documented history at this time  Assessments & Plan   There are no diagnoses linked to this encounter.    Additional instructions  Subjective:  Patient presents with venous ulcer of the Right lower extremity.    Procedure:  3 layer unna wrap was placed Right lower extremity.   Plan:   Follow up in one week.   

## 2018-12-19 ENCOUNTER — Other Ambulatory Visit: Payer: Self-pay

## 2018-12-19 ENCOUNTER — Ambulatory Visit (INDEPENDENT_AMBULATORY_CARE_PROVIDER_SITE_OTHER): Payer: Medicare Other | Admitting: Podiatry

## 2018-12-19 ENCOUNTER — Encounter: Payer: Self-pay | Admitting: Podiatry

## 2018-12-19 ENCOUNTER — Ambulatory Visit (INDEPENDENT_AMBULATORY_CARE_PROVIDER_SITE_OTHER): Payer: Medicare Other

## 2018-12-19 VITALS — BP 160/91 | HR 77

## 2018-12-19 DIAGNOSIS — R2241 Localized swelling, mass and lump, right lower limb: Secondary | ICD-10-CM

## 2018-12-19 DIAGNOSIS — E0843 Diabetes mellitus due to underlying condition with diabetic autonomic (poly)neuropathy: Secondary | ICD-10-CM | POA: Diagnosis not present

## 2018-12-19 DIAGNOSIS — M7989 Other specified soft tissue disorders: Secondary | ICD-10-CM

## 2018-12-19 DIAGNOSIS — L97512 Non-pressure chronic ulcer of other part of right foot with fat layer exposed: Secondary | ICD-10-CM

## 2018-12-19 NOTE — Progress Notes (Signed)
HPI: 68 y.o. male presenting today as a new patient for evaluation of continued drainage with skin breakdown noted to the right great toe as well as the second toe.  Patient has been treated and managed for several months now at the [facility] wound care center and he was subsequently discharged and referred here.  He says that despite management at the wound care center the skin just keeps peeling and has a continued odor with drainage.  Patient is type II diabetic with a history of squamous cell carcinoma of the buttocks.  Past Medical History:  Diagnosis Date  . Anemia   . 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 CHF (congestive heart failure) ([place])   . Chronic kidney disease   . COPD (chronic obstructive pulmonary disease) ([place])    NO INHALERS  . Diabetes mellitus    NO MEDS  . GERD (gastroesophageal reflux disease)   . Hepatic cirrhosis ([place])   . Hypertension   . Leg swelling   . Loose, teeth    PATIENT STATES "HAS 6 TEETH, SOME ARE LOOSE"  . Neuromuscular disorder ([place])    RIGHT HAND AND FINGERS NUMB  . Shortness of breath dyspnea    WITH EXERTION   . 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  Dermatology: Maceration noted with intermittent ulcerations encompassing the entirety of the right hallux and second toe.  Moderate malodor noted with serous drainage.  There is no exposed bone muscle tendon ligament or joint.  There does appear to be some indescribable abnormality noted to the ulcerations presenting.  This is not appear to be a typical classic diabetic foot ulceration.  Vascular: Palpable pedal pulses bilaterally.  Edema noted to the right hallux and second toe.  Neurological: Epicritic and protective threshold absent bilaterally.   Musculoskeletal Exam: Range of motion within normal limits to all pedal and  ankle joints bilateral. Muscle strength 5/5 in all groups bilateral.   Radiographic Exam:  Cortical disruption and erosion noted to the distal tuft of the great toe and second toe which would be concerning for osteomyelitis, however prior radiographic exam demonstrates the same cortical disruptions which were taken 05/16/17 and 01/28/2017.  X-rays today are essentially unchanged from prior x-rays  Assessment: 1.  Ulceration right hallux and second toe possibly of malignant etiology 2.  Concern for chronic osteomyelitis 3.  Diabetes mellitus with peripheral polyneuropathy: Uncontrolled   Plan of Care:  1. Patient evaluated. X-Rays reviewed.  2.  Today I did a 2 mm punch biopsy to the right hallux due to the abnormal appearance.  Biopsy was sent to pathology for gross and microscopic exam to rule out malignancy 3.  Today cultures were also taken of the drainage to the right hallux and second toe and sent to pathology for culture and sensitivity 4.  Continue daily dressing changes at home 5.  Return to clinic in 2 weeks to review punch biopsy results and culture results      Edrick Kins, DPM [facility]  Dr. Edrick Kins, DPM    [address]  [place], [place] [address]                Office [phone]  Fax [phone]

## 2018-12-22 ENCOUNTER — Other Ambulatory Visit: Payer: Self-pay

## 2018-12-22 ENCOUNTER — Encounter (INDEPENDENT_AMBULATORY_CARE_PROVIDER_SITE_OTHER): Payer: Self-pay | Admitting: Nurse Practitioner

## 2018-12-22 ENCOUNTER — Ambulatory Visit (INDEPENDENT_AMBULATORY_CARE_PROVIDER_SITE_OTHER): Payer: Medicare Other | Admitting: Nurse Practitioner

## 2018-12-22 VITALS — BP 203/84 | HR 76 | Resp 12 | Ht 70.0 in | Wt 187.0 lb

## 2018-12-22 DIAGNOSIS — I890 Lymphedema, not elsewhere classified: Secondary | ICD-10-CM | POA: Diagnosis not present

## 2018-12-22 LAB — WOUND CULTURE

## 2018-12-22 NOTE — Progress Notes (Signed)
History of Present Illness  There is no documented history at this time  Assessments & Plan   There are no diagnoses linked to this encounter.    Additional instructions  Subjective:  Patient presents with venous ulcer of the Right lower extremity.    Procedure:  3 layer unna wrap was placed Right lower extremity.   Plan:   Follow up in one week.   

## 2018-12-29 ENCOUNTER — Other Ambulatory Visit: Payer: Self-pay

## 2018-12-29 ENCOUNTER — Encounter (INDEPENDENT_AMBULATORY_CARE_PROVIDER_SITE_OTHER): Payer: Self-pay | Admitting: Nurse Practitioner

## 2018-12-29 ENCOUNTER — Ambulatory Visit (INDEPENDENT_AMBULATORY_CARE_PROVIDER_SITE_OTHER): Payer: Medicare Other | Admitting: Nurse Practitioner

## 2018-12-29 VITALS — BP 143/78 | HR 88 | Resp 16 | Ht 70.0 in | Wt 181.0 lb

## 2018-12-29 DIAGNOSIS — I890 Lymphedema, not elsewhere classified: Secondary | ICD-10-CM | POA: Diagnosis not present

## 2018-12-29 DIAGNOSIS — I10 Essential (primary) hypertension: Secondary | ICD-10-CM

## 2018-12-30 ENCOUNTER — Inpatient Hospital Stay: Payer: Medicare Other

## 2018-12-30 ENCOUNTER — Other Ambulatory Visit: Payer: Self-pay

## 2018-12-30 DIAGNOSIS — D631 Anemia in chronic kidney disease: Secondary | ICD-10-CM

## 2018-12-30 DIAGNOSIS — D472 Monoclonal gammopathy: Secondary | ICD-10-CM | POA: Diagnosis not present

## 2018-12-30 LAB — CBC WITH DIFFERENTIAL/PLATELET
Abs Immature Granulocytes: 0.03 10*3/uL (ref 0.00–0.07)
Basophils Absolute: 0.1 10*3/uL (ref 0.0–0.1)
Basophils Relative: 1 %
Eosinophils Absolute: 0.5 10*3/uL (ref 0.0–0.5)
Eosinophils Relative: 7 %
HCT: 32.8 % — ABNORMAL LOW (ref 39.0–52.0)
Hemoglobin: 10.4 g/dL — ABNORMAL LOW (ref 13.0–17.0)
Immature Granulocytes: 0 %
Lymphocytes Relative: 12 %
Lymphs Abs: 0.9 10*3/uL (ref 0.7–4.0)
MCH: 29.8 pg (ref 26.0–34.0)
MCHC: 31.7 g/dL (ref 30.0–36.0)
MCV: 94 fL (ref 80.0–100.0)
Monocytes Absolute: 0.7 10*3/uL (ref 0.1–1.0)
Monocytes Relative: 10 %
Neutro Abs: 5.1 10*3/uL (ref 1.7–7.7)
Neutrophils Relative %: 70 %
Platelets: 171 10*3/uL (ref 150–400)
RBC: 3.49 MIL/uL — ABNORMAL LOW (ref 4.22–5.81)
RDW: 13.6 % (ref 11.5–15.5)
WBC: 7.3 10*3/uL (ref 4.0–10.5)
nRBC: 0 % (ref 0.0–0.2)

## 2019-01-04 NOTE — Progress Notes (Signed)
SUBJECTIVE:  Patient ID: [name], male    DOB: 1951-01-23, 68 y.o.   MRN: [id number] Chief Complaint  Patient presents with   Follow-up    HPI  [name] is a 68 y.o. male that presents today due to swelling of his right lower extremity. He has been in [employer] for the last four weeks.   His foot is swelling as well as peeling on the top portion.  There is are two small open ulcerations on the top of his foot and near ankle. His swelling has greatly decreased.  He denies any fever, chills, nausea, vomiting or diarrhea.  He currently has wounds on his right toes which are being followed by the wound clinic.    Past Medical History:  Diagnosis Date   Anemia    Cancer () 11/02/2014   PERIANAL MASS, RIGHT; INCISIONAL BIOPSY: INVASIVE CARCINOMA WITH BASALOID FEATURES   CHF (congestive heart failure) (HCC)    Chronic kidney disease    COPD (chronic obstructive pulmonary disease) (HCC)    NO INHALERS   Diabetes mellitus    NO MEDS   GERD (gastroesophageal reflux disease)    Hepatic cirrhosis (HCC)    Hypertension    Leg swelling    Loose, teeth    PATIENT STATES "HAS 6 TEETH, SOME ARE LOOSE"   Neuromuscular disorder ([place])    RIGHT HAND AND FINGERS NUMB   Shortness of breath dyspnea    WITH EXERTION    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 02/24/2016    Past Surgical History:  Procedure Laterality Date   APPENDECTOMY     COLONOSCOPY WITH PROPOFOL N/A 11/19/2014   Procedure: COLONOSCOPY WITH PROPOFOL;  Surgeon: Lucilla Lame, MD;  Location: [place];  Service: Endoscopy;  Laterality: N/A;  DIABETIC-ORAL MEDS   COLONOSCOPY WITH PROPOFOL N/A 10/04/2016   Procedure: COLONOSCOPY WITH PROPOFOL;  Surgeon: Jonathon Bellows, MD;  Location: [facility] ENDOSCOPY;  Service: Endoscopy;  Laterality: N/A;   ESOPHAGOGASTRODUODENOSCOPY (EGD) WITH PROPOFOL N/A 10/04/2016   Procedure: ESOPHAGOGASTRODUODENOSCOPY (EGD) WITH PROPOFOL;  Surgeon: Jonathon Bellows, MD;   Location: [facility]COPY;  Service: Endoscopy;  Laterality: N/A;   GIVENS CAPSULE STUDY N/A 11/13/2016   Procedure: GIVENS CAPSULE STUDY;  Surgeon: Jonathon Bellows, MD;  Location: [facility] ENDOSCOPY;  Service: Gastroenterology;  Laterality: N/A;   INCISION AND DRAINAGE PERIRECTAL ABSCESS Right 12/16/2017   Procedure: EXCISION BASAL CELL CANCER/ GLUTEAL ABSCESS AND FLAP COVERAGE;  Surgeon: Robert Bellow, MD;  Location: [facility] ORS;  Service: General;  Laterality: Right;   INGUINAL LYMPH NODE BIOPSY Right 01/26/2016   Procedure: INGUINAL LYMPH NODE BIOPSY;  Surgeon: Clayburn Pert, MD;  Location: [facility] ORS;  Service: General;  Laterality: Right;   RECTAL BIOPSY N/A 11/02/2014   Procedure: BIOPSY RECTAL;  Surgeon: Marlyce Huge, MD;  Location: [facility] ORS;  Service: General;  Laterality: N/A;   RECTAL EXAM UNDER ANESTHESIA N/A 11/02/2014   Procedure: RECTAL EXAM UNDER ANESTHESIA;  Surgeon: Marlyce Huge, MD;  Location: [facility] ORS;  Service: General;  Laterality: N/A;   TONSILLECTOMY      Social History   Socioeconomic History   Marital status: Divorced    Spouse name: Not on file   Number of children: Not on file   Years of education: Not on file   Highest education level: Not on file  Occupational History   Not on file  Social Needs   Financial resource strain: Somewhat hard   Food insecurity    Worry: Patient  refused    Inability: Patient refused   Transportation needs    Medical: Patient refused    Non-medical: Patient refused  Tobacco Use   Smoking status: Current Every Day Smoker    Packs/day: 0.25    Years: 20.00    Pack years: 5.00    Types: Cigarettes   Smokeless tobacco: Never Used   Tobacco comment: 3-4 cigarettes per day  Substance and Sexual Activity   Alcohol use: Not Currently   Drug use: Not Currently    Comment: Last Use 2016 per patient-+ UDS FOR COCAINE IN 2014   Sexual activity: Never  Lifestyle   Physical activity    Days per week: 0  days    Minutes per session: 0 min   Stress: To some extent  Relationships   Social connections    Talks on phone: More than three times a week    Gets together: More than three times a week    Attends religious service: More than 4 times per year    Active member of club or organization: Yes    Attends meetings of clubs or organizations: More than 4 times per year    Relationship status: Divorced   Intimate partner violence    Fear of current or ex partner: Patient refused    Emotionally abused: Patient refused    Physically abused: Patient refused    Forced sexual activity: Patient refused  Other Topics Concern   Not on file  Social History Narrative   Not on file    Family History  Problem Relation Age of Onset   Asthma Mother    Colon cancer Neg Hx     No Known Allergies   Review of Systems   Review of Systems: Negative Unless Checked Constitutional: [] Weight loss  [] Fever  [] Chills Cardiac: [] Chest pain   []  Atrial Fibrillation  [] Palpitations   [] Shortness of breath when laying flat   [] Shortness of breath with exertion. [] Shortness of breath at rest Vascular:  [] Pain in legs with walking   [] Pain in legs with standing [] Pain in legs when laying flat   [] Claudication    [] Pain in feet when laying flat    [] History of DVT   [] Phlebitis   .[x] Swelling in legs   [] Varicose veins   [] Non-healing ulcers Pulmonary:   [] Uses home oxygen   [] Productive cough   [] Hemoptysis   [] Wheeze  [] COPD   [] Asthma Neurologic:  [] Dizziness   [] Seizures  [] Blackouts [] History of stroke   [] History of TIA  [] Aphasia   [] Temporary Blindness   [] Weakness or numbness in arm   [] Weakness or numbness in leg Musculoskeletal:   [] Joint swelling   [] Joint pain   [] Low back pain  []  History of Knee Replacement [] Arthritis [] back Surgeries  []  Spinal Stenosis    Hematologic:  [] Easy bruising  [] Easy bleeding   [] Hypercoagulable state   [x] Anemic Gastrointestinal:  [] Diarrhea   [] Vomiting   [] Gastroesophageal reflux/heartburn   [] Difficulty swallowing. [] Abdominal pain Genitourinary:  [x] Chronic kidney disease   [] Difficult urination  [] Anuric   [] Blood in urine [] Frequent urination  [] Burning with urination   [] Hematuria Skin:  [] Rashes   [] Ulcers [] Wounds Psychological:  [] History of anxiety   []  History of major depression  []  Memory Difficulties      OBJECTIVE:   Physical Exam  BP (!) 143/78 (BP Location: Left Arm, Patient Position: Sitting, Cuff Size: Large)    Pulse 88    Resp 16    Ht 5\' 10"  (  1.778 m)    Wt 181 lb (82.1 kg)    BMI 25.97 kg/m   Gen: WD/WN, NAD Head: [place]/AT, No temporalis wasting.  Ear/Nose/Throat: Hearing grossly intact, nares w/o erythema or drainage Eyes: PER, EOMI, sclera nonicteric.  Neck: Supple, no masses.  No JVD.  Pulmonary:  Good air movement, no use of accessory muscles.  Cardiac: RRR Vascular: 1+ edema right lower extremity, wound still present. Small ulcerations near ankle  Vessel Right Left  Radial Palpable Palpable   Gastrointestinal: soft, non-distended. No guarding/no peritoneal signs.  Musculoskeletal: M/S 5/5 throughout.  No deformity or atrophy.  Neurologic: Pain and light touch intact in extremities.  Symmetrical.  Speech is fluent. Motor exam as listed above. Psychiatric: Judgment intact, Mood & affect appropriate for pt's clinical situation. Dermatologic: No Venous rashes. No Ulcers Noted.  No changes consistent with cellulitis. Lymph : No Cervical lymphadenopathy, no lichenification or skin changes of chronic lymphedema.       ASSESSMENT AND PLAN:  1. Lymphedema Patient continues to have small ulcerations near ankle and on top of foot.  We will continue to have patient in wraps for four weeks He will have them changed weekly drainage permitting.     2. Essential hypertension Continue antihypertensive medications as already ordered, these medications have been reviewed and there are no changes at this  time.    Current Outpatient Medications on File Prior to Visit  Medication Sig Dispense Refill   amLODipine (NORVASC) 10 MG tablet Take 10 mg by mouth 2 (two) times daily.      atorvastatin (LIPITOR) 40 MG tablet Take 40 mg by mouth every morning.      bismuth subsalicylate (PEPTO BISMOL) 262 MG/15ML suspension Take 30 mLs by mouth every 6 (six) hours as needed for indigestion or diarrhea or loose stools.      docusate sodium (COLACE) 100 MG capsule Take 1 capsule (100 mg total) by mouth 2 (two) times daily. 10 capsule 0   ferrous sulfate 325 (65 FE) MG tablet Take 325 mg by mouth daily.      furosemide (LASIX) 20 MG tablet Take 20 mg by mouth daily.      hydrALAZINE (APRESOLINE) 25 MG tablet Take 25 mg by mouth daily.      omeprazole (PRILOSEC) 40 MG capsule Take 40 mg by mouth every morning.      polyethylene glycol (MIRALAX / GLYCOLAX) packet Take 17 g by mouth daily as needed for mild constipation.      sodium bicarbonate 650 MG tablet Take 1 tablet (650 mg total) by mouth 3 (three) times daily. (Patient taking differently: Take 650 mg by mouth 2 (two) times daily. ) 20 tablet 0   torsemide (DEMADEX) 20 MG tablet Take 20 mg by mouth daily.     vitamin B-12 (CYANOCOBALAMIN) 1000 MCG tablet Take 1 tablet (1,000 mcg total) by mouth daily. 30 tablet 3   Vitamin D, Ergocalciferol, (DRISDOL) 50000 units CAPS capsule Take 50,000 Units by mouth every Tuesday.      Current Facility-Administered Medications on File Prior to Visit  Medication Dose Route Frequency Provider Last Rate Last Dose   epoetin alfa-epbx (RETACRIT) injection 10,000 Units  10,000 Units Subcutaneous Once Sindy Guadeloupe, MD        There are no Patient Instructions on file for this visit. No follow-ups on file.   Kris Hartmann, NP  This note was completed with Sales executive.  Any errors are purely unintentional.

## 2019-01-05 ENCOUNTER — Ambulatory Visit (INDEPENDENT_AMBULATORY_CARE_PROVIDER_SITE_OTHER): Payer: Medicare Other | Admitting: Nurse Practitioner

## 2019-01-05 ENCOUNTER — Other Ambulatory Visit: Payer: Self-pay

## 2019-01-05 VITALS — BP 198/94 | HR 76 | Resp 12 | Ht 70.0 in | Wt 184.0 lb

## 2019-01-05 DIAGNOSIS — I890 Lymphedema, not elsewhere classified: Secondary | ICD-10-CM | POA: Diagnosis not present

## 2019-01-05 NOTE — Progress Notes (Signed)
History of Present Illness  There is no documented history at this time  Assessments & Plan   There are no diagnoses linked to this encounter.    Additional instructions  Subjective:  Patient presents with venous ulcer of the Right lower extremity.    Procedure:  3 layer unna wrap was placed Right lower extremity.   Plan:   Follow up in one week.   

## 2019-01-09 ENCOUNTER — Ambulatory Visit (INDEPENDENT_AMBULATORY_CARE_PROVIDER_SITE_OTHER): Payer: Medicare Other | Admitting: Podiatry

## 2019-01-09 ENCOUNTER — Other Ambulatory Visit: Payer: Self-pay

## 2019-01-09 ENCOUNTER — Encounter: Payer: Self-pay | Admitting: Podiatry

## 2019-01-09 DIAGNOSIS — L97512 Non-pressure chronic ulcer of other part of right foot with fat layer exposed: Secondary | ICD-10-CM

## 2019-01-09 DIAGNOSIS — E0843 Diabetes mellitus due to underlying condition with diabetic autonomic (poly)neuropathy: Secondary | ICD-10-CM | POA: Diagnosis not present

## 2019-01-09 MED ORDER — SULFAMETHOXAZOLE-TRIMETHOPRIM 800-160 MG PO TABS: 1.0000 | ORAL_TABLET | Freq: Two times a day (BID) | ORAL | 0 refills | Status: DC

## 2019-01-09 MED ORDER — BETAMETHASONE DIPROPIONATE 0.05 % EX CREA: TOPICAL_CREAM | Freq: Two times a day (BID) | CUTANEOUS | 3 refills | Status: DC

## 2019-01-09 MED ORDER — GENTAMICIN SULFATE 0.1 % EX CREA: 1.0000 "application " | TOPICAL_CREAM | Freq: Two times a day (BID) | CUTANEOUS | 1 refills | Status: DC

## 2019-01-12 ENCOUNTER — Encounter (INDEPENDENT_AMBULATORY_CARE_PROVIDER_SITE_OTHER): Payer: Medicare Other

## 2019-01-14 NOTE — Progress Notes (Signed)
   HPI: 68 y.o. male presenting today for follow-up evaluation regarding an ulceration to the right foot.  Last visit on 12/19/2018 biopsy was taken and cultures were taken also.  He presents today for follow-up treatment and evaluation  Past Medical History:  Diagnosis Date  . Anemia   . Cancer ([place]) 11/02/2014   PERIANAL MASS, RIGHT; INCISIONAL BIOPSY: INVASIVE CARCINOMA WITH BASALOID FEATURES  . CHF (congestive heart failure) ([place])   . Chronic kidney disease   . COPD (chronic obstructive pulmonary disease) ([place])    NO INHALERS  . Diabetes mellitus    NO MEDS  . GERD (gastroesophageal reflux disease)   . Hepatic cirrhosis ([place])   . Hypertension   . Leg swelling   . Loose, teeth    PATIENT STATES "HAS 6 TEETH, SOME ARE LOOSE"  . Neuromuscular disorder ([place])    RIGHT HAND AND FINGERS NUMB  . Shortness of breath dyspnea    WITH EXERTION   . 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  Dermatology: Maceration noted with intermittent ulcerations encompassing the entirety of the right hallux and second toe.  Moderate malodor noted with serous drainage.  There is no exposed bone muscle tendon ligament or joint.  There does appear to be some indescribable abnormality noted to the ulcerations presenting.  This is not appear to be a typical classic diabetic foot ulceration.  Vascular: Palpable pedal pulses bilaterally.  Edema noted to the right hallux and second toe.  Neurological: Epicritic and protective threshold absent bilaterally.   Musculoskeletal Exam: Range of motion within normal limits to all pedal and ankle joints bilateral. Muscle strength 5/5 in all groups bilateral.   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  Ulceration right hallux and second toe  2.  Concern for chronic  osteomyelitis 3.  Diabetes mellitus with peripheral polyneuropathy: Uncontrolled   Plan of Care:  1. Patient evaluated.  Cultures and pathology reviewed.  2.  Prescription for Bactrim DS based on cultures and sensitivity 3.  Prescription for gentamicin cream 4.  Prescription for betamethasone 0.05% cream.  Instructions were provided to mix the gentamicin cream with the betamethasone cream and apply daily. 5.  If there is no improvement we may need to include a full-thickness epidermis excisional biopsy of the lesion for additional pathology review to rule out squamous cell carcinoma 6.  Return to clinic in 2 weeks     Edrick Kins, DPM [facility]  Dr. Edrick Kins, DPM    [address], [place] [address]                Office [phone]  Fax [phone]

## 2019-01-19 ENCOUNTER — Ambulatory Visit (INDEPENDENT_AMBULATORY_CARE_PROVIDER_SITE_OTHER): Payer: Medicare Other | Admitting: Nurse Practitioner

## 2019-01-20 ENCOUNTER — Other Ambulatory Visit: Payer: Self-pay

## 2019-01-20 ENCOUNTER — Inpatient Hospital Stay: Payer: Medicare Other

## 2019-01-20 ENCOUNTER — Inpatient Hospital Stay: Payer: Medicare Other | Attending: Oncology

## 2019-01-20 VITALS — BP 148/74 | HR 71

## 2019-01-20 DIAGNOSIS — N189 Chronic kidney disease, unspecified: Secondary | ICD-10-CM | POA: Diagnosis not present

## 2019-01-20 DIAGNOSIS — D631 Anemia in chronic kidney disease: Secondary | ICD-10-CM | POA: Insufficient documentation

## 2019-01-20 DIAGNOSIS — D509 Iron deficiency anemia, unspecified: Secondary | ICD-10-CM | POA: Diagnosis not present

## 2019-01-20 DIAGNOSIS — I130 Hypertensive heart and chronic kidney disease with heart failure and stage 1 through stage 4 chronic kidney disease, or unspecified chronic kidney disease: Secondary | ICD-10-CM | POA: Diagnosis not present

## 2019-01-20 DIAGNOSIS — Z79899 Other long term (current) drug therapy: Secondary | ICD-10-CM | POA: Insufficient documentation

## 2019-01-20 DIAGNOSIS — D472 Monoclonal gammopathy: Secondary | ICD-10-CM

## 2019-01-20 LAB — CBC WITH DIFFERENTIAL/PLATELET
Abs Immature Granulocytes: 0.09 10*3/uL — ABNORMAL HIGH (ref 0.00–0.07)
Basophils Absolute: 0 10*3/uL (ref 0.0–0.1)
Basophils Relative: 1 %
Eosinophils Absolute: 0.3 10*3/uL (ref 0.0–0.5)
Eosinophils Relative: 4 %
HCT: 22.4 % — ABNORMAL LOW (ref 39.0–52.0)
Hemoglobin: 7.1 g/dL — ABNORMAL LOW (ref 13.0–17.0)
Immature Granulocytes: 2 %
Lymphocytes Relative: 16 %
Lymphs Abs: 1 10*3/uL (ref 0.7–4.0)
MCH: 30.3 pg (ref 26.0–34.0)
MCHC: 31.7 g/dL (ref 30.0–36.0)
MCV: 95.7 fL (ref 80.0–100.0)
Monocytes Absolute: 0.7 10*3/uL (ref 0.1–1.0)
Monocytes Relative: 11 %
Neutro Abs: 4 10*3/uL (ref 1.7–7.7)
Neutrophils Relative %: 66 %
Platelets: 185 10*3/uL (ref 150–400)
RBC: 2.34 MIL/uL — ABNORMAL LOW (ref 4.22–5.81)
RDW: 14.8 % (ref 11.5–15.5)
WBC: 6.1 10*3/uL (ref 4.0–10.5)
nRBC: 0 % (ref 0.0–0.2)

## 2019-01-20 MED ORDER — EPOETIN ALFA-EPBX 10000 UNIT/ML IJ SOLN
10000.0000 [IU] | Freq: Once | INTRAMUSCULAR | Status: AC
  Administered 2019-01-20: 10000 [IU] via SUBCUTANEOUS

## 2019-01-20 MED ORDER — EPOETIN ALFA-EPBX 40000 UNIT/ML IJ SOLN
40000.0000 [IU] | Freq: Once | INTRAMUSCULAR | Status: AC
  Administered 2019-01-20: 40000 [IU] via SUBCUTANEOUS

## 2019-01-20 MED ORDER — EPOETIN ALFA-EPBX 40000 UNIT/ML IJ SOLN: 50000.0000 [IU] | Freq: Once | INTRAMUSCULAR | Status: DC

## 2019-01-23 ENCOUNTER — Ambulatory Visit: Payer: Medicare Other | Admitting: Podiatry

## 2019-01-27 ENCOUNTER — Encounter: Payer: Self-pay | Admitting: Podiatry

## 2019-01-27 ENCOUNTER — Other Ambulatory Visit: Payer: Self-pay

## 2019-01-27 ENCOUNTER — Ambulatory Visit (INDEPENDENT_AMBULATORY_CARE_PROVIDER_SITE_OTHER): Payer: Medicare Other | Admitting: Podiatry

## 2019-01-27 DIAGNOSIS — E0843 Diabetes mellitus due to underlying condition with diabetic autonomic (poly)neuropathy: Secondary | ICD-10-CM | POA: Diagnosis not present

## 2019-01-27 DIAGNOSIS — L97512 Non-pressure chronic ulcer of other part of right foot with fat layer exposed: Secondary | ICD-10-CM

## 2019-01-27 MED ORDER — BETAMETHASONE DIPROPIONATE 0.05 % EX CREA: TOPICAL_CREAM | Freq: Two times a day (BID) | CUTANEOUS | 3 refills | Status: DC

## 2019-01-27 MED ORDER — GENTAMICIN SULFATE 0.1 % EX CREA: 1.0000 "application " | TOPICAL_CREAM | Freq: Two times a day (BID) | CUTANEOUS | 1 refills | Status: DC

## 2019-01-29 DIAGNOSIS — N184 Chronic kidney disease, stage 4 (severe): Secondary | ICD-10-CM | POA: Insufficient documentation

## 2019-01-29 DIAGNOSIS — N2581 Secondary hyperparathyroidism of renal origin: Secondary | ICD-10-CM | POA: Insufficient documentation

## 2019-01-29 DIAGNOSIS — R809 Proteinuria, unspecified: Secondary | ICD-10-CM | POA: Insufficient documentation

## 2019-01-30 NOTE — Progress Notes (Signed)
   HPI: 68 y.o. male presenting today for follow-up evaluation regarding an ulceration to the right foot.  Patient states that there has been significant improvement since last visit.  He has been applying a mixture of betamethasone cream with gentamicin cream to the area with significant improvement.  He is also been taking his oral Bactrim DS as per culture and sensitivity results.  Past Medical History:  Diagnosis Date  . Anemia   . Cancer ([place]) 11/02/2014   PERIANAL MASS, RIGHT; INCISIONAL BIOPSY: INVASIVE CARCINOMA WITH BASALOID FEATURES  . CHF (congestive heart failure) ([place])   . Chronic kidney disease   . COPD (chronic obstructive pulmonary disease) ([place])    NO INHALERS  . Diabetes mellitus    NO MEDS  . GERD (gastroesophageal reflux disease)   . Hepatic cirrhosis ([place])   . Hypertension   . Leg swelling   . Loose, teeth    PATIENT STATES "HAS 6 TEETH, SOME ARE LOOSE"  . Neuromuscular disorder ([place])    RIGHT HAND AND FINGERS NUMB  . Shortness of breath dyspnea    WITH EXERTION   . 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  Dermatology: Improved maceration noted with intermittent ulcerations encompassing the entirety of the right hallux and second toe measuring a total of 4.0 x 4.0 x 0.1 cm.  Negative for any malodor today.  There is no exposed bone muscle tendon ligament or joint.  Improved appearance overall of the ulcerations noted  Vascular: Palpable pedal pulses bilaterally.  Edema noted to the right hallux and second toe.  Neurological: Epicritic and protective threshold absent bilaterally.   Musculoskeletal Exam: Range of motion within normal limits to all pedal and ankle joints bilateral. Muscle strength 5/5 in all groups bilateral.   Assessment: 1.  Ulceration right hallux and second toe  2.  Diabetes mellitus with peripheral polyneuropathy: Uncontrolled   Plan of  Care:  1. Patient evaluated.   2.  Finish oral Bactrim DS as prescribed  3.  Continue the combination mixture of gentamicin cream and betamethasone 0.05% cream  4.  Medically necessary excisional debridement including subcutaneous tissue was performed to the ulceration sites.  Excisional debridement of all the necrotic nonviable tissue down to the healthy bleeding viable tissue was performed with post debridement measurement same as pre-.   5.  Dry sterile dressing was applied  6.  Return to clinic in 4 weeks     Edrick Kins, DPM [facility]  Dr. Edrick Kins, DPM    [address], [place] [address]                Office [phone]  Fax [phone]

## 2019-02-09 ENCOUNTER — Other Ambulatory Visit: Payer: Self-pay | Admitting: *Deleted

## 2019-02-09 DIAGNOSIS — D631 Anemia in chronic kidney disease: Secondary | ICD-10-CM

## 2019-02-09 DIAGNOSIS — D509 Iron deficiency anemia, unspecified: Secondary | ICD-10-CM

## 2019-02-10 ENCOUNTER — Other Ambulatory Visit: Payer: Self-pay

## 2019-02-10 ENCOUNTER — Inpatient Hospital Stay: Payer: Medicare Other | Attending: Oncology

## 2019-02-10 ENCOUNTER — Other Ambulatory Visit: Payer: Self-pay | Admitting: Oncology

## 2019-02-10 ENCOUNTER — Other Ambulatory Visit: Payer: Self-pay | Admitting: *Deleted

## 2019-02-10 ENCOUNTER — Inpatient Hospital Stay: Payer: Medicare Other

## 2019-02-10 DIAGNOSIS — D649 Anemia, unspecified: Secondary | ICD-10-CM

## 2019-02-10 DIAGNOSIS — D631 Anemia in chronic kidney disease: Secondary | ICD-10-CM | POA: Insufficient documentation

## 2019-02-10 DIAGNOSIS — D472 Monoclonal gammopathy: Secondary | ICD-10-CM | POA: Diagnosis present

## 2019-02-10 DIAGNOSIS — Z79899 Other long term (current) drug therapy: Secondary | ICD-10-CM | POA: Diagnosis not present

## 2019-02-10 DIAGNOSIS — N189 Chronic kidney disease, unspecified: Secondary | ICD-10-CM | POA: Diagnosis not present

## 2019-02-10 DIAGNOSIS — I130 Hypertensive heart and chronic kidney disease with heart failure and stage 1 through stage 4 chronic kidney disease, or unspecified chronic kidney disease: Secondary | ICD-10-CM | POA: Diagnosis not present

## 2019-02-10 DIAGNOSIS — D509 Iron deficiency anemia, unspecified: Secondary | ICD-10-CM | POA: Diagnosis not present

## 2019-02-10 LAB — CBC WITH DIFFERENTIAL/PLATELET
Abs Immature Granulocytes: 0.02 10*3/uL (ref 0.00–0.07)
Basophils Absolute: 0 10*3/uL (ref 0.0–0.1)
Basophils Relative: 1 %
Eosinophils Absolute: 0.2 10*3/uL (ref 0.0–0.5)
Eosinophils Relative: 4 %
HCT: 19.3 % — ABNORMAL LOW (ref 39.0–52.0)
Hemoglobin: 6 g/dL — ABNORMAL LOW (ref 13.0–17.0)
Immature Granulocytes: 0 %
Lymphocytes Relative: 16 %
Lymphs Abs: 0.9 10*3/uL (ref 0.7–4.0)
MCH: 28.4 pg (ref 26.0–34.0)
MCHC: 31.1 g/dL (ref 30.0–36.0)
MCV: 91.5 fL (ref 80.0–100.0)
Monocytes Absolute: 0.8 10*3/uL (ref 0.1–1.0)
Monocytes Relative: 15 %
Neutro Abs: 3.5 10*3/uL (ref 1.7–7.7)
Neutrophils Relative %: 64 %
Platelets: 147 10*3/uL — ABNORMAL LOW (ref 150–400)
RBC: 2.11 MIL/uL — ABNORMAL LOW (ref 4.22–5.81)
RDW: 14.4 % (ref 11.5–15.5)
WBC: 5.5 10*3/uL (ref 4.0–10.5)
nRBC: 0 % (ref 0.0–0.2)

## 2019-02-10 LAB — SAMPLE TO BLOOD BANK

## 2019-02-10 LAB — PREPARE RBC (CROSSMATCH)

## 2019-02-11 ENCOUNTER — Inpatient Hospital Stay: Payer: Medicare Other

## 2019-02-11 ENCOUNTER — Other Ambulatory Visit: Payer: Self-pay

## 2019-02-11 DIAGNOSIS — D472 Monoclonal gammopathy: Secondary | ICD-10-CM | POA: Diagnosis not present

## 2019-02-11 DIAGNOSIS — D649 Anemia, unspecified: Secondary | ICD-10-CM

## 2019-02-11 MED ORDER — SODIUM CHLORIDE 0.9% IV SOLUTION
250.0000 mL | Freq: Once | INTRAVENOUS | Status: AC
  Administered 2019-02-11: 250 mL via INTRAVENOUS
  Filled 2019-02-11: qty 250

## 2019-02-12 LAB — TYPE AND SCREEN
ABO/RH(D): A POS
Antibody Screen: NEGATIVE
Unit division: 0
Unit division: 0

## 2019-02-12 LAB — BPAM RBC
Blood Product Expiration Date: 202011282359
Blood Product Expiration Date: 202011282359
ISSUE DATE / TIME: 202011040927
ISSUE DATE / TIME: 202011041114
Unit Type and Rh: 6200
Unit Type and Rh: 6200

## 2019-02-24 ENCOUNTER — Encounter: Payer: Self-pay | Admitting: Podiatry

## 2019-02-24 ENCOUNTER — Ambulatory Visit (INDEPENDENT_AMBULATORY_CARE_PROVIDER_SITE_OTHER): Payer: Medicare Other | Admitting: Podiatry

## 2019-02-24 ENCOUNTER — Other Ambulatory Visit: Payer: Self-pay

## 2019-02-24 DIAGNOSIS — E0843 Diabetes mellitus due to underlying condition with diabetic autonomic (poly)neuropathy: Secondary | ICD-10-CM

## 2019-02-24 DIAGNOSIS — L97512 Non-pressure chronic ulcer of other part of right foot with fat layer exposed: Secondary | ICD-10-CM | POA: Diagnosis not present

## 2019-02-24 MED ORDER — GENTAMICIN SULFATE 0.1 % EX CREA: 1.0000 "application " | TOPICAL_CREAM | Freq: Two times a day (BID) | CUTANEOUS | 1 refills | Status: DC

## 2019-02-24 MED ORDER — BETAMETHASONE DIPROPIONATE 0.05 % EX CREA: TOPICAL_CREAM | Freq: Two times a day (BID) | CUTANEOUS | 3 refills | Status: DC

## 2019-02-26 NOTE — Progress Notes (Signed)
   HPI: 68 y.o. male presenting today for follow-up evaluation regarding an ulceration to the right foot. He states the wound looks about the same. He has been applying Gentamicin and Betamethasone as directed. He denies worsening factors at this time. Patient is here for further evaluation and treatment.   Past Medical History:  Diagnosis Date  . Anemia   . Cancer ([place]) 11/02/2014   PERIANAL MASS, RIGHT; INCISIONAL BIOPSY: INVASIVE CARCINOMA WITH BASALOID FEATURES  . CHF (congestive heart failure) ([place])   . Chronic kidney disease   . COPD (chronic obstructive pulmonary disease) ([place])    NO INHALERS  . Diabetes mellitus    NO MEDS  . GERD (gastroesophageal reflux disease)   . Hepatic cirrhosis ([place])   . Hypertension   . Leg swelling   . Loose, teeth    PATIENT STATES "HAS 6 TEETH, SOME ARE LOOSE"  . Neuromuscular disorder ([place])    RIGHT HAND AND FINGERS NUMB  . Shortness of breath dyspnea    WITH EXERTION   . 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  Dermatology: Improved maceration noted with intermittent ulcerations encompassing the entirety of the right hallux and second toe measuring a total of 4.0 x 4.0 x 0.1 cm.  Negative for any malodor today.  There is no exposed bone muscle tendon ligament or joint.  Improved appearance overall of the ulcerations noted  Vascular: Palpable pedal pulses bilaterally.  Edema noted to the right hallux and second toe.  Neurological: Epicritic and protective threshold absent bilaterally.   Musculoskeletal Exam: Range of motion within normal limits to all pedal and ankle joints bilateral. Muscle strength 5/5 in all groups bilateral.   Assessment: 1.  Ulceration right hallux and second toe  2.  Diabetes mellitus with peripheral polyneuropathy: Uncontrolled   Plan of Care:  1. Patient evaluated.   2. Medically necessary excisional debridement  including subcutaneous tissue was performed using a tissue nipper and a chisel blade. Excisional debridement of all the necrotic nonviable tissue down to healthy bleeding viable tissue was performed with post-debridement measurements same as pre-. 3. The wound was cleansed and dry sterile dressing applied. 4. Continue the combination mixture of gentamicin cream and betamethasone 0.05% cream  5. Refill prescription for Betamethasone 0.05% cream provided to patient.  6. Return to clinic in 4 weeks.      Edrick Kins, DPM [facility]  Dr. Edrick Kins, DPM    [address],  [address]                Office [phone]  Fax [phone]

## 2019-03-03 ENCOUNTER — Other Ambulatory Visit: Payer: Self-pay

## 2019-03-03 ENCOUNTER — Encounter: Payer: Self-pay | Admitting: Oncology

## 2019-03-03 ENCOUNTER — Inpatient Hospital Stay: Payer: Medicare Other

## 2019-03-03 ENCOUNTER — Telehealth: Payer: Self-pay | Admitting: *Deleted

## 2019-03-03 ENCOUNTER — Inpatient Hospital Stay (HOSPITAL_BASED_OUTPATIENT_CLINIC_OR_DEPARTMENT_OTHER): Payer: Medicare Other | Admitting: Oncology

## 2019-03-03 VITALS — BP 161/72 | HR 74 | Temp 97.3°F | Resp 18 | Wt 187.1 lb

## 2019-03-03 DIAGNOSIS — D472 Monoclonal gammopathy: Secondary | ICD-10-CM | POA: Diagnosis not present

## 2019-03-03 DIAGNOSIS — N189 Chronic kidney disease, unspecified: Secondary | ICD-10-CM | POA: Diagnosis not present

## 2019-03-03 DIAGNOSIS — D509 Iron deficiency anemia, unspecified: Secondary | ICD-10-CM

## 2019-03-03 DIAGNOSIS — D631 Anemia in chronic kidney disease: Secondary | ICD-10-CM

## 2019-03-03 DIAGNOSIS — D649 Anemia, unspecified: Secondary | ICD-10-CM

## 2019-03-03 LAB — IRON AND TIBC
Iron: 25 ug/dL — ABNORMAL LOW (ref 45–182)
Saturation Ratios: 8 % — ABNORMAL LOW (ref 17.9–39.5)
TIBC: 316 ug/dL (ref 250–450)
UIBC: 291 ug/dL

## 2019-03-03 LAB — CBC WITH DIFFERENTIAL/PLATELET
Abs Immature Granulocytes: 0.03 10*3/uL (ref 0.00–0.07)
Basophils Absolute: 0 10*3/uL (ref 0.0–0.1)
Basophils Relative: 0 %
Eosinophils Absolute: 0.2 10*3/uL (ref 0.0–0.5)
Eosinophils Relative: 4 %
HCT: 23.7 % — ABNORMAL LOW (ref 39.0–52.0)
Hemoglobin: 7.4 g/dL — ABNORMAL LOW (ref 13.0–17.0)
Immature Granulocytes: 1 %
Lymphocytes Relative: 14 %
Lymphs Abs: 0.8 10*3/uL (ref 0.7–4.0)
MCH: 29.1 pg (ref 26.0–34.0)
MCHC: 31.2 g/dL (ref 30.0–36.0)
MCV: 93.3 fL (ref 80.0–100.0)
Monocytes Absolute: 0.8 10*3/uL (ref 0.1–1.0)
Monocytes Relative: 14 %
Neutro Abs: 3.9 10*3/uL (ref 1.7–7.7)
Neutrophils Relative %: 67 %
Platelets: 155 10*3/uL (ref 150–400)
RBC: 2.54 MIL/uL — ABNORMAL LOW (ref 4.22–5.81)
RDW: 17.5 % — ABNORMAL HIGH (ref 11.5–15.5)
WBC: 5.8 10*3/uL (ref 4.0–10.5)
nRBC: 0 % (ref 0.0–0.2)

## 2019-03-03 LAB — COMPREHENSIVE METABOLIC PANEL
ALT: 38 U/L (ref 0–44)
AST: 54 U/L — ABNORMAL HIGH (ref 15–41)
Albumin: 2.8 g/dL — ABNORMAL LOW (ref 3.5–5.0)
Alkaline Phosphatase: 106 U/L (ref 38–126)
Anion gap: 5 (ref 5–15)
BUN: 53 mg/dL — ABNORMAL HIGH (ref 8–23)
CO2: 14 mmol/L — ABNORMAL LOW (ref 22–32)
Calcium: 7.7 mg/dL — ABNORMAL LOW (ref 8.9–10.3)
Chloride: 111 mmol/L (ref 98–111)
Creatinine, Ser: 4.34 mg/dL — ABNORMAL HIGH (ref 0.61–1.24)
GFR calc Af Amer: 15 mL/min — ABNORMAL LOW (ref >60)
GFR calc non Af Amer: 13 mL/min — ABNORMAL LOW (ref >60)
Glucose, Bld: 172 mg/dL — ABNORMAL HIGH (ref 70–99)
Potassium: 4.8 mmol/L (ref 3.5–5.1)
Sodium: 130 mmol/L — ABNORMAL LOW (ref 135–145)
Total Bilirubin: 0.5 mg/dL (ref 0.3–1.2)
Total Protein: 7.4 g/dL (ref 6.5–8.1)

## 2019-03-03 LAB — FERRITIN: Ferritin: 96 ng/mL (ref 24–336)

## 2019-03-03 MED ORDER — EPOETIN ALFA-EPBX 40000 UNIT/ML IJ SOLN
40000.0000 [IU] | Freq: Once | INTRAMUSCULAR | Status: AC
  Administered 2019-03-03: 40000 [IU] via SUBCUTANEOUS

## 2019-03-03 MED ORDER — EPOETIN ALFA-EPBX 10000 UNIT/ML IJ SOLN
10000.0000 [IU] | Freq: Once | INTRAMUSCULAR | Status: AC
  Administered 2019-03-03: 11:00:00 10000 [IU] via SUBCUTANEOUS

## 2019-03-03 MED ORDER — EPOETIN ALFA-EPBX 40000 UNIT/ML IJ SOLN: 50000.0000 [IU] | Freq: Once | INTRAMUSCULAR | Status: DC

## 2019-03-03 NOTE — Telephone Encounter (Signed)
I called pt. phone number and pt did not answer and cannot leave a message at this time. Will try back tomorrow to see if he will answer.

## 2019-03-03 NOTE — Telephone Encounter (Signed)
-----   Message from Sindy Guadeloupe, MD sent at 03/03/2019  3:24 PM EST ----- Needs iv iron when we have room

## 2019-03-03 NOTE — Progress Notes (Signed)
Patient stated that he had been doing well with no complaints. 

## 2019-03-04 ENCOUNTER — Telehealth: Payer: Self-pay | Admitting: *Deleted

## 2019-03-04 LAB — KAPPA/LAMBDA LIGHT CHAINS
Kappa free light chain: 201.6 mg/L — ABNORMAL HIGH (ref 3.3–19.4)
Kappa, lambda light chain ratio: 1.61 (ref 0.26–1.65)
Lambda free light chains: 125 mg/L — ABNORMAL HIGH (ref 5.7–26.3)

## 2019-03-04 NOTE — Telephone Encounter (Signed)
Called today to pt. And again it says caller not available. No way to leave message. Will cont. To try

## 2019-03-09 ENCOUNTER — Telehealth: Payer: Self-pay | Admitting: *Deleted

## 2019-03-09 LAB — MULTIPLE MYELOMA PANEL, SERUM
Albumin SerPl Elph-Mcnc: 2.9 g/dL (ref 2.9–4.4)
Albumin/Glob SerPl: 0.8 (ref 0.7–1.7)
Alpha 1: 0.2 g/dL (ref 0.0–0.4)
Alpha2 Glob SerPl Elph-Mcnc: 0.7 g/dL (ref 0.4–1.0)
B-Globulin SerPl Elph-Mcnc: 0.8 g/dL (ref 0.7–1.3)
Gamma Glob SerPl Elph-Mcnc: 2.2 g/dL — ABNORMAL HIGH (ref 0.4–1.8)
Globulin, Total: 3.9 g/dL (ref 2.2–3.9)
IgA: 259 mg/dL (ref 61–437)
IgG (Immunoglobin G), Serum: 2428 mg/dL — ABNORMAL HIGH (ref 603–1613)
IgM (Immunoglobulin M), Srm: 77 mg/dL (ref 20–172)
M Protein SerPl Elph-Mcnc: 1 g/dL — ABNORMAL HIGH
Total Protein ELP: 6.8 g/dL (ref 6.0–8.5)

## 2019-03-09 NOTE — Progress Notes (Signed)
Hematology/Oncology Consult note [facility]  Telephone:([phone] [phone]  Patient Care Team: [name], MD as PCP - General (Internal Medicine) Sindy Guadeloupe, MD as Consulting Physician (Hematology and Oncology)   Name of the patient: [name]  [id number]  October 26, 1950   Date of visit: 03/09/19  Diagnosis- 1.Anemia- multifactorial- iron deficiency, chronic disease and anemia of kidney disease  2. 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. 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 Baruch Gouty. Patient had CT abdomen in July 2016 which showed retroperitoneal, celiac and gastric adenopathy. MR pelvis in Oct 2017 showed b/l inguinal and external iliac adenopathy as well. Inguinal LN biopsy was negative for malignancy.   2. Biopsy of the skin lesion showed: DIAGNOSIS:  A. PERIANAL POLYP, LEFT; EXCISION:  - SKIN WITH POLYPOID VASCULAR PROLIFERATION WITH SUPERFICIAL NECROSIS,  SEE COMMENT.   B. PERIANAL MASS, RIGHT; INCISIONAL BIOPSY:  - INVASIVE CARCINOMA WITH BASALOID FEATURES.  - SEE COMMENT.   Comment #1:  The differential diagnosis for the perianal polyp includes pyogenic  granuloma and ulcerated hemangioma.   Comment #2:  Immunohistochemical stains were performed. The carcinoma is positive for  p16, BerEp4, and BCL-2. Stain controls worked appropriately.  Unfortunately the pattern of staining does not assist with further  classification of the carcinoma. Clinical correlation is required to  determine if the lesion involves perianal skin primarily (BCC) or is  arising in the anal canal (squamous cell carcinoma with basaloid  features).   This was followed by RT to the lesion.  3. He had repeat CT abdomen in feb  2018 which showed: IMPRESSION: Progressive hepatic cirrhosis since prior study. No evidence of hepatic neoplasm.  New moderate ascites, diffuse mesenteric and body wall edema, and small bilateral pleural effusions. Mild diffuse small bowel wall thickening, consistent with hypoalbuminemia.  Findings consistent with portal venous hypertension, including mild splenomegaly and upper abdominal venous collaterals.  Stable mild abdominal lymphadenopathy. Mild increase in bilateral iliac and inguinal lymphadenopathy in the pelvis, which may be reactive in etiology, with metastatic disease considered less Likely.  4. Heunderwentegd and colonoscopy for his microcytic anemiawhich showed no overt bleeding lesions or malignancy  5. CBC on 09/18/2016 showed H&H of 6.5/19.8 and MCV of 86.7. Labs suggestive of iron deficiency anemia. B12 and folate was within normal limits. Multiple myeloma panel showed IGG monoclonal protein of 0.9 gm. Polyclonal gammopathyHaptoglobin and no copper was normal. Reticulocyte count was mildly elevated at 7.9. Patient received 2 doses of ferriheme on 620 and 10/03/2016. He also received 1 unit of blood transfusion  6. EGD showed a normal duodenum and normal esophagus. Found to have gastritis which was biopsied. Also found to have a large polypoid mass in the left piriform sinus and nonobstructing the airway. This was subsequently evaluated by ENT which did not find any evidence of mass. The colonoscopy showed appendixthat was appearing protruding and ordered this blood with stool. No mucus seen in the vicinity. He was evaluated by a [facility] and has been considered for a repeat colonoscopy versus endoscopy mucosal resection of the mucocele  7.Bonemarrowbiopsy showed hypercellular marrow for age. 9% plasma cells. Clonality of cells could not be evaluated because of lack of clot sections available for staining. Cytogenetics  were normal. FISH studies were  negative for any chromosomal abnormalities as well.  8. Patient received procrit for anemia of chronic kidney disease with improvement of his anemia to 10   Interval history-he reports doing well and denies any specific complaints.  He has mild chronic fatigue  ECOG PS- 1 Pain scale- 0 Opioid associated constipation- no  Review of systems- Review of Systems  Constitutional: Positive for malaise/fatigue. Negative for chills, fever and weight loss.  HENT: Negative for congestion, ear discharge and nosebleeds.   Eyes: Negative for blurred vision.  Respiratory: Negative for cough, hemoptysis, sputum production, shortness of breath and wheezing.   Cardiovascular: Negative for chest pain, palpitations, orthopnea and claudication.  Gastrointestinal: Negative for abdominal pain, blood in stool, constipation, diarrhea, heartburn, melena, nausea and vomiting.  Genitourinary: Negative for dysuria, flank pain, frequency, hematuria and urgency.  Musculoskeletal: Negative for back pain, joint pain and myalgias.  Skin: Negative for rash.  Neurological: Negative for dizziness, tingling, focal weakness, seizures, weakness and headaches.  Endo/Heme/Allergies: Does not bruise/bleed easily.  Psychiatric/Behavioral: Negative for depression and suicidal ideas. The patient does not have insomnia.        No Known Allergies   Past Medical History:  Diagnosis Date  . Anemia   . Cancer ([place]) 11/02/2014   PERIANAL MASS, RIGHT; INCISIONAL BIOPSY: INVASIVE CARCINOMA WITH BASALOID FEATURES  . CHF (congestive heart failure) ([place])   . Chronic kidney disease   . COPD (chronic obstructive pulmonary disease) ([place])    NO INHALERS  . Diabetes mellitus    NO MEDS  . GERD (gastroesophageal reflux disease)   . Hepatic cirrhosis ([place])   . Hypertension   . Leg swelling   . Loose, teeth    PATIENT STATES "HAS 6 TEETH, SOME ARE LOOSE"  . Neuromuscular disorder ([place])    RIGHT HAND AND FINGERS NUMB  . Shortness  of breath dyspnea    WITH EXERTION   . 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 02/24/2016     Past Surgical History:  Procedure Laterality Date  . APPENDECTOMY    . COLONOSCOPY WITH PROPOFOL N/A 11/19/2014   Procedure: COLONOSCOPY WITH PROPOFOL;  Surgeon: Lucilla Lame, MD;  Location: [place];  Service: Endoscopy;  Laterality: N/A;  DIABETIC-ORAL MEDS  . COLONOSCOPY WITH PROPOFOL N/A 10/04/2016   Procedure: COLONOSCOPY WITH PROPOFOL;  Surgeon: Jonathon Bellows, MD;  [facility] ENDOSCOPY;  Service: Endoscopy;  Laterality: N/A;  . ESOPHAGOGASTRODUODENOSCOPY (EGD) WITH PROPOFOL N/A 10/04/2016   Procedure: ESOPHAGOGASTRODUODENOSCOPY (EGD) WITH PROPOFOL;  Surgeon: Jonathon Bellows, MD;  Location: [facility] ENDOSCOPY;  Service: Endoscopy;  Laterality: N/A;  . GIVENS CAPSULE STUDY N/A 11/13/2016   Procedure: GIVENS CAPSULE STUDY;  Surgeon: Jonathon Bellows, MD;  Location: [facility] ENDOSCOPY;  Service: Gastroenterology;  Laterality: N/A;  . INCISION AND DRAINAGE PERIRECTAL ABSCESS Right 12/16/2017   Procedure: EXCISION BASAL CELL CANCER/ GLUTEAL ABSCESS AND FLAP COVERAGE;  Surgeon: Robert Bellow, MD;  Location: [facility] ORS;  Service: General;  Laterality: Right;  . INGUINAL LYMPH NODE BIOPSY Right 01/26/2016   Procedure: INGUINAL LYMPH NODE BIOPSY;  Surgeon: Clayburn Pert, MD;  Location: [facility] ORS;  Service: General;  Laterality: Right;  . RECTAL BIOPSY N/A 11/02/2014   Procedure: BIOPSY RECTAL;  Surgeon: Marlyce Huge, MD;  Location: [facility] ORS;  Service: General;  Laterality: N/A;  . RECTAL EXAM UNDER ANESTHESIA N/A 11/02/2014   Procedure: RECTAL EXAM UNDER ANESTHESIA;  Surgeon: Marlyce Huge, MD;  Location: [facility] ORS;  Service: General;  Laterality: N/A;  .  TONSILLECTOMY      Social History   Socioeconomic History  . Marital status: Divorced    Spouse name: Not on file  . Number of children: Not on file  . Years of education: Not on file  . Highest education level: Not on file   Occupational History  . Not on file  Social Needs  . Financial resource strain: Somewhat hard  . Food insecurity    Worry: Patient refused    Inability: Patient refused  . Transportation needs    Medical: Patient refused    Non-medical: Patient refused  Tobacco Use  . Smoking status: Current Every Day Smoker    Packs/day: 0.25    Years: 20.00    Pack years: 5.00    Types: Cigarettes  . Smokeless tobacco: Never Used  . Tobacco comment: 3-4 cigarettes per day  Substance and Sexual Activity  . Alcohol use: Not Currently  . Drug use: Not Currently    Comment: Last Use 2016 per patient-+ UDS FOR COCAINE IN 2014  . Sexual activity: Never  Lifestyle  . Physical activity    Days per week: 0 days    Minutes per session: 0 min  . Stress: To some extent  Relationships  . Social connections    Talks on phone: More than three times a week    Gets together: More than three times a week    Attends religious service: More than 4 times per year    Active member of club or organization: Yes    Attends meetings of clubs or organizations: More than 4 times per year    Relationship status: Divorced  . Intimate partner violence    Fear of current or ex partner: Patient refused    Emotionally abused: Patient refused    Physically abused: Patient refused    Forced sexual activity: Patient refused  Other Topics Concern  . Not on file  Social History Narrative  . Not on file    Family History  Problem Relation Age of Onset  . Asthma Mother   . Colon cancer Neg Hx      Current Outpatient Medications:  .  amLODipine (NORVASC) 10 MG tablet, Take 10 mg by mouth 2 (two) times daily. , Disp: , Rfl:  .  atorvastatin (LIPITOR) 40 MG tablet, Take 40 mg by mouth every morning. , Disp: , Rfl:  .  betamethasone dipropionate 0.05 % cream, Apply topically 2 (two) times daily., Disp: 45 g, Rfl: 3 .  docusate sodium (COLACE) 100 MG capsule, Take 1 capsule (100 mg total) by mouth 2 (two) times  daily., Disp: 10 capsule, Rfl: 0 .  ferrous sulfate 325 (65 FE) MG tablet, Take 325 mg by mouth daily. , Disp: , Rfl:  .  furosemide (LASIX) 20 MG tablet, Take 20 mg by mouth daily. , Disp: , Rfl:  .  gentamicin cream (GARAMYCIN) 0.1 %, Apply 1 application topically 2 (two) times daily., Disp: 45 g, Rfl: 1 .  hydrALAZINE (APRESOLINE) 25 MG tablet, Take 25 mg by mouth daily. , Disp: , Rfl:  .  omeprazole (PRILOSEC) 40 MG capsule, Take 40 mg by mouth every morning. , Disp: , Rfl:  .  polyethylene glycol (MIRALAX / GLYCOLAX) packet, Take 17 g by mouth daily as needed for mild constipation. , Disp: , Rfl:  .  sodium bicarbonate 650 MG tablet, Take 1 tablet (650 mg total) by mouth 3 (three) times daily. (Patient taking differently: Take 650 mg by mouth 2 (  two) times daily. ), Disp: 20 tablet, Rfl: 0 .  sulfamethoxazole-trimethoprim (BACTRIM DS) 800-160 MG tablet, Take 1 tablet by mouth 2 (two) times daily., Disp: 28 tablet, Rfl: 0 .  torsemide (DEMADEX) 20 MG tablet, Take 20 mg by mouth daily., Disp: , Rfl:  .  vitamin B-12 (CYANOCOBALAMIN) 1000 MCG tablet, Take 1 tablet (1,000 mcg total) by mouth daily., Disp: 30 tablet, Rfl: 3 .  Vitamin D, Ergocalciferol, (DRISDOL) 50000 units CAPS capsule, Take 50,000 Units by mouth every Tuesday. , Disp: , Rfl:  No current facility-administered medications for this visit.   Facility-Administered Medications Ordered in Other Visits:  .  epoetin alfa-epbx (RETACRIT) injection 10,000 Units, 10,000 Units, Subcutaneous, Once **AND** [DISCONTINUED] epoetin alfa-epbx (RETACRIT) injection 40,000 Units, 40,000 Units, Subcutaneous, Once, Sindy Guadeloupe, MD  Physical exam:  Vitals:   03/03/19 1003  BP: (!) 161/72  Pulse: 74  Resp: 18  Temp: (!) 97.3 F (36.3 C)  TempSrc: Tympanic  SpO2: 100%  Weight: 187 lb 1.6 oz (84.9 kg)   Physical Exam Constitutional:      General: He is not in acute distress. HENT:     Head: Normocephalic and atraumatic.  Eyes:      Pupils: Pupils are equal, round, and reactive to light.  Neck:     Musculoskeletal: Normal range of motion.  Cardiovascular:     Rate and Rhythm: Normal rate and regular rhythm.     Heart sounds: Normal heart sounds.  Pulmonary:     Effort: Pulmonary effort is normal.     Breath sounds: Normal breath sounds.  Abdominal:     General: Bowel sounds are normal.     Palpations: Abdomen is soft.  Skin:    General: Skin is warm and dry.  Neurological:     Mental Status: He is alert and oriented to person, place, and time.      CMP Latest Ref Rng & Units 03/03/2019  Glucose 70 - 99 mg/dL 172(H)  BUN 8 - 23 mg/dL 53(H)  Creatinine 0.61 - 1.24 mg/dL 4.34(H)  Sodium 135 - 145 mmol/L 130(L)  Potassium 3.5 - 5.1 mmol/L 4.8  Chloride 98 - 111 mmol/L 111  CO2 22 - 32 mmol/L 14(L)  Calcium 8.9 - 10.3 mg/dL 7.7(L)  Total Protein 6.5 - 8.1 g/dL 7.4  Total Bilirubin 0.3 - 1.2 mg/dL 0.5  Alkaline Phos 38 - 126 U/L 106  AST 15 - 41 U/L 54(H)  ALT 0 - 44 U/L 38   CBC Latest Ref Rng & Units 03/03/2019  WBC 4.0 - 10.5 K/uL 5.8  Hemoglobin 13.0 - 17.0 g/dL 7.4(L)  Hematocrit 39.0 - 52.0 % 23.7(L)  Platelets 150 - 400 K/uL 155      Assessment and plan- Patient is a 68 y.o. male with anemia of chronic kidney disease along with component of iron deficiency and IgA MGUS here for routine follow-up  1. Today his hemoglobin is again down to 7.4.  Ferritin is 99 but iron studies showed a low iron saturation of 8% and a low serum iron of 25.  I will therefore plan to give him 2 doses of Feraheme 510 mg weekly which she has tolerated well in the past.  Following adequate iron replacement we will restart Retacrit which she has been receiving every 3 weeks to maintain his hemoglobin between 10-11.  I will tentatively see him back in 12 weeks with labs hematocrit  2.  IgA MGUS: Myeloma panel is currently pending and serum free light  chain ratio is normal.  Both kappa and lambda free light chains are  elevated secondary to chronic kidney disease   Visit Diagnosis 1. Symptomatic anemia   2. Anemia of chronic renal failure, unspecified CKD stage   3. Iron deficiency anemia, unspecified iron deficiency anemia type   4. MGUS (monoclonal gammopathy of unknown significance)      Dr. Randa Evens, MD, MPH [facility] [phone] 03/09/2019 10:46 AM

## 2019-03-09 NOTE — Telephone Encounter (Signed)
Called pt and no one answered. The phone says no one home please call again. I have called him today makes 3 rd time. I called his family [name] who brings him and he said yes to the iron and also he will tell pt. Just call [name] with the appts. I will send a message to our staff to to have them call with his appts for iron

## 2019-03-09 NOTE — Telephone Encounter (Signed)
-----   Message from Sindy Guadeloupe, MD sent at 03/03/2019  3:24 PM EST ----- Needs iv iron when we have room

## 2019-03-12 ENCOUNTER — Other Ambulatory Visit: Payer: Self-pay | Admitting: Oncology

## 2019-03-16 ENCOUNTER — Other Ambulatory Visit: Payer: Self-pay

## 2019-03-16 ENCOUNTER — Inpatient Hospital Stay: Payer: Medicare Other | Attending: Oncology

## 2019-03-16 VITALS — BP 160/68 | HR 78 | Resp 18

## 2019-03-16 DIAGNOSIS — I130 Hypertensive heart and chronic kidney disease with heart failure and stage 1 through stage 4 chronic kidney disease, or unspecified chronic kidney disease: Secondary | ICD-10-CM | POA: Insufficient documentation

## 2019-03-16 DIAGNOSIS — D631 Anemia in chronic kidney disease: Secondary | ICD-10-CM | POA: Diagnosis not present

## 2019-03-16 DIAGNOSIS — D509 Iron deficiency anemia, unspecified: Secondary | ICD-10-CM | POA: Diagnosis not present

## 2019-03-16 DIAGNOSIS — Z79899 Other long term (current) drug therapy: Secondary | ICD-10-CM | POA: Insufficient documentation

## 2019-03-16 DIAGNOSIS — N189 Chronic kidney disease, unspecified: Secondary | ICD-10-CM | POA: Insufficient documentation

## 2019-03-16 DIAGNOSIS — D472 Monoclonal gammopathy: Secondary | ICD-10-CM | POA: Insufficient documentation

## 2019-03-16 MED ORDER — IRON SUCROSE 20 MG/ML IV SOLN
200.0000 mg | Freq: Once | INTRAVENOUS | Status: AC
  Administered 2019-03-16: 200 mg via INTRAVENOUS
  Filled 2019-03-16: qty 10

## 2019-03-16 MED ORDER — SODIUM CHLORIDE 0.9 % IV SOLN: 200.0000 mg | Freq: Once | INTRAVENOUS | Status: DC

## 2019-03-16 MED ORDER — SODIUM CHLORIDE 0.9 % IV SOLN
Freq: Once | INTRAVENOUS | Status: AC
  Administered 2019-03-16: 12:00:00 via INTRAVENOUS
  Filled 2019-03-16: qty 250

## 2019-03-17 ENCOUNTER — Other Ambulatory Visit: Payer: Self-pay

## 2019-03-18 ENCOUNTER — Inpatient Hospital Stay: Payer: Medicare Other

## 2019-03-18 ENCOUNTER — Other Ambulatory Visit: Payer: Self-pay

## 2019-03-18 VITALS — BP 162/75 | HR 74 | Temp 96.8°F | Resp 20

## 2019-03-18 DIAGNOSIS — D509 Iron deficiency anemia, unspecified: Secondary | ICD-10-CM

## 2019-03-18 DIAGNOSIS — D472 Monoclonal gammopathy: Secondary | ICD-10-CM | POA: Diagnosis not present

## 2019-03-18 MED ORDER — SODIUM CHLORIDE 0.9 % IV SOLN
Freq: Once | INTRAVENOUS | Status: AC
  Administered 2019-03-18: 12:00:00 via INTRAVENOUS
  Filled 2019-03-18: qty 250

## 2019-03-18 MED ORDER — IRON SUCROSE 20 MG/ML IV SOLN
200.0000 mg | Freq: Once | INTRAVENOUS | Status: AC
  Administered 2019-03-18: 200 mg via INTRAVENOUS
  Filled 2019-03-18: qty 10

## 2019-03-20 ENCOUNTER — Other Ambulatory Visit: Payer: Self-pay

## 2019-03-20 ENCOUNTER — Inpatient Hospital Stay: Payer: Medicare Other

## 2019-03-20 VITALS — BP 160/73 | HR 76 | Resp 20

## 2019-03-20 DIAGNOSIS — D509 Iron deficiency anemia, unspecified: Secondary | ICD-10-CM

## 2019-03-20 DIAGNOSIS — D472 Monoclonal gammopathy: Secondary | ICD-10-CM | POA: Diagnosis not present

## 2019-03-20 MED ORDER — SODIUM CHLORIDE 0.9 % IV SOLN
Freq: Once | INTRAVENOUS | Status: AC
  Administered 2019-03-20: 12:00:00 via INTRAVENOUS
  Filled 2019-03-20: qty 250

## 2019-03-20 MED ORDER — IRON SUCROSE 20 MG/ML IV SOLN
200.0000 mg | Freq: Once | INTRAVENOUS | Status: AC
  Administered 2019-03-20: 200 mg via INTRAVENOUS
  Filled 2019-03-20: qty 10

## 2019-03-23 ENCOUNTER — Inpatient Hospital Stay: Payer: Medicare Other

## 2019-03-23 ENCOUNTER — Other Ambulatory Visit: Payer: Self-pay

## 2019-03-23 VITALS — BP 196/89 | HR 75 | Temp 96.6°F | Resp 20

## 2019-03-23 DIAGNOSIS — D509 Iron deficiency anemia, unspecified: Secondary | ICD-10-CM

## 2019-03-23 DIAGNOSIS — D472 Monoclonal gammopathy: Secondary | ICD-10-CM | POA: Diagnosis not present

## 2019-03-23 MED ORDER — SODIUM CHLORIDE 0.9 % IV SOLN
Freq: Once | INTRAVENOUS | Status: AC
  Administered 2019-03-23: 12:00:00 via INTRAVENOUS
  Filled 2019-03-23: qty 250

## 2019-03-23 MED ORDER — IRON SUCROSE 20 MG/ML IV SOLN
200.0000 mg | Freq: Once | INTRAVENOUS | Status: AC
  Administered 2019-03-23: 200 mg via INTRAVENOUS
  Filled 2019-03-23: qty 10

## 2019-03-23 NOTE — Progress Notes (Signed)
Pt tolerated Venofer well. B/P 196/89. Pt denies any concerns or symptoms of distress. No s/s of distress noted. Pt reports that he "forgot to take my blood pressure medicine, because I was rushing this morning." Dr. Janese Banks aware, Per Dr. Tiburcio Pea RN will contact patients PCP for follow up, okay to discharge pt home. Pt is asymptomatic at this time. Pt educated to call 911 or report to ER for an emergency or if symptoms of hypertension develop including but not limited to headache or blurred vision.  Pt stable at time of discharge.

## 2019-03-24 ENCOUNTER — Inpatient Hospital Stay: Payer: Medicare Other

## 2019-03-25 ENCOUNTER — Ambulatory Visit (INDEPENDENT_AMBULATORY_CARE_PROVIDER_SITE_OTHER): Payer: Medicare Other | Admitting: Surgery

## 2019-03-25 ENCOUNTER — Other Ambulatory Visit: Payer: Self-pay

## 2019-03-25 ENCOUNTER — Encounter: Payer: Self-pay | Admitting: Surgery

## 2019-03-25 VITALS — BP 188/90 | HR 77 | Temp 98.6°F | Resp 18 | Ht 70.0 in | Wt 196.0 lb

## 2019-03-25 DIAGNOSIS — C44519 Basal cell carcinoma of skin of other part of trunk: Secondary | ICD-10-CM

## 2019-03-25 NOTE — Progress Notes (Signed)
Outpatient Surgical Follow Up  03/25/2019  [name] is an 68 y.o. male.   Chief Complaint  Patient presents with  . 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  Denies any new complaints or concerns related the buttocks lesions.  He does report some shortness of breath as well as issue with his lungs over the last week or so.  He was tested for Covid and was negative.  She has a history of noncompliance.  He does get iron infusions in the cancer center.  He does have a history of alcoholic cirrhosis.  Past Medical History:  Diagnosis Date  . Anemia   . Cancer ([place]) 11/02/2014   PERIANAL MASS, RIGHT; INCISIONAL BIOPSY: INVASIVE CARCINOMA WITH BASALOID FEATURES  . CHF (congestive heart failure) ([place])   . Chronic kidney disease   . COPD (chronic obstructive pulmonary disease) ([place])    NO INHALERS  . Diabetes mellitus    NO MEDS  . GERD (gastroesophageal reflux disease)   . Hepatic cirrhosis ([place])   . Hypertension   . Leg swelling   . Loose, teeth    PATIENT STATES "HAS 6 TEETH, SOME ARE LOOSE"  . Neuromuscular disorder ([place])    RIGHT HAND AND FINGERS NUMB  . Shortness of breath dyspnea    WITH EXERTION   . 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 APPENDECTOMY    . COLONOSCOPY WITH PROPOFOL N/A 11/19/2014   Procedure: COLONOSCOPY WITH PROPOFOL;  Surgeon: Lucilla Lame, MD;  Location: [place];  Service: Endoscopy;  Laterality: N/A;  DIABETIC-ORAL MEDS  . COLONOSCOPY WITH PROPOFOL N/A 10/04/2016   Procedure: COLONOSCOPY WITH PROPOFOL;  Surgeon: Jonathon Bellows, MD;  Location: [facility] ENDOSCOPY;  Service: Endoscopy;  Laterality: N/A;  . ESOPHAGOGASTRODUODENOSCOPY (EGD) WITH PROPOFOL N/A 10/04/2016   Procedure: ESOPHAGOGASTRODUODENOSCOPY (EGD) WITH PROPOFOL;  Surgeon: Jonathon Bellows, MD;   Location: [facility] ENDOSCOPY;  Service: Endoscopy;  Laterality: N/A;  . GIVENS CAPSULE STUDY N/A 11/13/2016   Procedure: GIVENS CAPSULE STUDY;  Surgeon: Jonathon Bellows, MD;  Location: [facility] ENDOSCOPY;  Service: Gastroenterology;  Laterality: N/A;  . INCISION AND DRAINAGE PERIRECTAL ABSCESS Right 12/16/2017   Procedure: EXCISION BASAL CELL CANCER/ GLUTEAL ABSCESS AND FLAP COVERAGE;  Surgeon: Robert Bellow, MD;  Location: [facility] ORS;  Service: General;  Laterality: Right;  . INGUINAL LYMPH NODE BIOPSY Right 01/26/2016   Procedure: INGUINAL LYMPH NODE BIOPSY;  Surgeon: Clayburn Pert, MD;  Location: [facility] ORS;  Service: General;  Laterality: Right;  . RECTAL BIOPSY N/A 11/02/2014   Procedure: BIOPSY RECTAL;  Surgeon: Marlyce Huge, MD;  Location: [facility] ORS;  Service: General;  Laterality: N/A;  . RECTAL EXAM UNDER ANESTHESIA N/A 11/02/2014   Procedure: RECTAL EXAM UNDER ANESTHESIA;  Surgeon: Marlyce Huge, MD;  Location: [facility] ORS;  Service: General;  Laterality: N/A;  . TONSILLECTOMY      Family History  Problem Relation Age of Onset  . Asthma Mother   . Colon cancer Neg Hx     Social History:  reports that he has been smoking cigarettes. He has a 5.00 pack-year smoking history. He has never used smokeless tobacco. He reports previous alcohol use. He reports previous drug use.  Allergies: No Known Allergies  Medications reviewed.    ROS Full ROS performed and is otherwise negative other than  what is stated in HPI   BP (!) 188/90   Pulse 77   Temp 98.6 F (37 C) (Temporal)   Resp 18   Ht 5\' 10"  (1.778 m)   Wt 196 lb (88.9 kg)   SpO2 95%   BMI 28.12 kg/m   Physical Exam Vitals and nursing note reviewed. Exam conducted with a chaperone present.  Constitutional:      Appearance: He is ill-appearing.     Comments: He is malnourished and chronically debilitated  Cardiovascular:     Rate and Rhythm: Normal rate and regular rhythm.  Pulmonary:     Effort: Respiratory  distress present.     Breath sounds: No stridor. Rhonchi present.  Chest:     Chest wall: No tenderness.  Abdominal:     General: Abdomen is flat. There is no distension.     Palpations: There is no mass.     Tenderness: There is no abdominal tenderness. There is no guarding.     Hernia: No hernia is present.  Genitourinary:    Comments: Evidence of previous right buttocks radiation changes.  No evidence of new concerning lesions.  No evidence of any intrarectal masses. Musculoskeletal:        General: No swelling or tenderness. Normal range of motion.     Cervical back: Normal range of motion. No rigidity or tenderness.  Skin:    General: Skin is warm and dry.     Capillary Refill: Capillary refill takes less than 2 seconds.  Neurological:     General: No focal deficit present.     Mental Status: He is alert and oriented to person, place, and time.  Psychiatric:        Mood and Affect: Mood normal.        Behavior: Behavior normal.        Thought Content: Thought content normal.        Judgment: Judgment normal.         Assessment/Plan: History of basal cell carcinoma on the right buttocks.  Clinical exam no evidence of recurrence.  Recommend annual physical exam.  No need for surgical intervention at this time.   Greater than 50% of the 25 minutes  visit was spent in counseling/coordination of care   Caroleen Hamman, MD [place] Surgeon

## 2019-03-25 NOTE — Patient Instructions (Addendum)
We will call you in December 2021 to schedule an appointment.

## 2019-03-26 ENCOUNTER — Inpatient Hospital Stay: Payer: Medicare Other

## 2019-03-26 ENCOUNTER — Other Ambulatory Visit: Payer: Self-pay

## 2019-03-26 VITALS — BP 160/81 | HR 81 | Temp 97.4°F | Resp 20

## 2019-03-26 DIAGNOSIS — D472 Monoclonal gammopathy: Secondary | ICD-10-CM | POA: Diagnosis not present

## 2019-03-26 DIAGNOSIS — D509 Iron deficiency anemia, unspecified: Secondary | ICD-10-CM

## 2019-03-26 MED ORDER — SODIUM CHLORIDE 0.9 % IV SOLN
Freq: Once | INTRAVENOUS | Status: AC
  Filled 2019-03-26: qty 250

## 2019-03-26 MED ORDER — IRON SUCROSE 20 MG/ML IV SOLN
200.0000 mg | Freq: Once | INTRAVENOUS | Status: AC
  Administered 2019-03-26: 200 mg via INTRAVENOUS
  Filled 2019-03-26: qty 10

## 2019-03-31 ENCOUNTER — Ambulatory Visit: Payer: Medicare Other | Admitting: Podiatry

## 2019-04-01 ENCOUNTER — Other Ambulatory Visit: Payer: Self-pay

## 2019-04-01 ENCOUNTER — Emergency Department: Payer: Medicare Other

## 2019-04-01 ENCOUNTER — Encounter: Payer: Self-pay | Admitting: Emergency Medicine

## 2019-04-01 ENCOUNTER — Emergency Department
Admission: EM | Admit: 2019-04-01 | Discharge: 2019-04-02 | Disposition: A | Discharge status: Home / Self Care | Payer: Medicare Other | Attending: Emergency Medicine | Admitting: Emergency Medicine

## 2019-04-01 DIAGNOSIS — D649 Anemia, unspecified: Secondary | ICD-10-CM

## 2019-04-01 DIAGNOSIS — Z79899 Other long term (current) drug therapy: Secondary | ICD-10-CM | POA: Insufficient documentation

## 2019-04-01 DIAGNOSIS — E1122 Type 2 diabetes mellitus with diabetic chronic kidney disease: Secondary | ICD-10-CM | POA: Diagnosis not present

## 2019-04-01 DIAGNOSIS — Z20828 Contact with and (suspected) exposure to other viral communicable diseases: Secondary | ICD-10-CM | POA: Insufficient documentation

## 2019-04-01 DIAGNOSIS — N184 Chronic kidney disease, stage 4 (severe): Secondary | ICD-10-CM | POA: Insufficient documentation

## 2019-04-01 DIAGNOSIS — I130 Hypertensive heart and chronic kidney disease with heart failure and stage 1 through stage 4 chronic kidney disease, or unspecified chronic kidney disease: Secondary | ICD-10-CM | POA: Insufficient documentation

## 2019-04-01 DIAGNOSIS — R0602 Shortness of breath: Secondary | ICD-10-CM | POA: Diagnosis present

## 2019-04-01 DIAGNOSIS — J441 Chronic obstructive pulmonary disease with (acute) exacerbation: Secondary | ICD-10-CM | POA: Diagnosis not present

## 2019-04-01 DIAGNOSIS — I509 Heart failure, unspecified: Secondary | ICD-10-CM | POA: Insufficient documentation

## 2019-04-01 DIAGNOSIS — F17210 Nicotine dependence, cigarettes, uncomplicated: Secondary | ICD-10-CM | POA: Diagnosis not present

## 2019-04-01 LAB — TROPONIN I (HIGH SENSITIVITY)
Troponin I (High Sensitivity): 38 ng/L — ABNORMAL HIGH (ref <18)
Troponin I (High Sensitivity): 38 ng/L — ABNORMAL HIGH (ref <18)

## 2019-04-01 LAB — BASIC METABOLIC PANEL
Anion gap: 11 (ref 5–15)
BUN: 51 mg/dL — ABNORMAL HIGH (ref 8–23)
CO2: 15 mmol/L — ABNORMAL LOW (ref 22–32)
Calcium: 8.1 mg/dL — ABNORMAL LOW (ref 8.9–10.3)
Chloride: 110 mmol/L (ref 98–111)
Creatinine, Ser: 4.13 mg/dL — ABNORMAL HIGH (ref 0.61–1.24)
GFR calc Af Amer: 16 mL/min — ABNORMAL LOW (ref >60)
GFR calc non Af Amer: 14 mL/min — ABNORMAL LOW (ref >60)
Glucose, Bld: 136 mg/dL — ABNORMAL HIGH (ref 70–99)
Potassium: 4.8 mmol/L (ref 3.5–5.1)
Sodium: 136 mmol/L (ref 135–145)

## 2019-04-01 LAB — CBC
HCT: 21.6 % — ABNORMAL LOW (ref 39.0–52.0)
Hemoglobin: 6.7 g/dL — ABNORMAL LOW (ref 13.0–17.0)
MCH: 29.9 pg (ref 26.0–34.0)
MCHC: 31 g/dL (ref 30.0–36.0)
MCV: 96.4 fL (ref 80.0–100.0)
Platelets: 162 10*3/uL (ref 150–400)
RBC: 2.24 MIL/uL — ABNORMAL LOW (ref 4.22–5.81)
RDW: 14.8 % (ref 11.5–15.5)
WBC: 5.7 10*3/uL (ref 4.0–10.5)
nRBC: 0 % (ref 0.0–0.2)

## 2019-04-01 LAB — PREPARE RBC (CROSSMATCH)

## 2019-04-01 LAB — RESPIRATORY PANEL BY RT PCR (FLU A&B, COVID)
Influenza A by PCR: NEGATIVE
Influenza B by PCR: NEGATIVE
SARS Coronavirus 2 by RT PCR: NEGATIVE

## 2019-04-01 LAB — PROCALCITONIN: Procalcitonin: 0.15 ng/mL

## 2019-04-01 MED ORDER — IPRATROPIUM-ALBUTEROL 0.5-2.5 (3) MG/3ML IN SOLN
3.0000 mL | Freq: Once | RESPIRATORY_TRACT | Status: AC
  Administered 2019-04-01: 3 mL via RESPIRATORY_TRACT
  Filled 2019-04-01: qty 3

## 2019-04-01 MED ORDER — ALBUTEROL SULFATE HFA 108 (90 BASE) MCG/ACT IN AERS: 2.0000 | INHALATION_SPRAY | Freq: Four times a day (QID) | RESPIRATORY_TRACT | 1 refills | Status: DC | PRN

## 2019-04-01 MED ORDER — METHYLPREDNISOLONE SODIUM SUCC 125 MG IJ SOLR
80.0000 mg | Freq: Once | INTRAMUSCULAR | Status: AC
  Administered 2019-04-01: 80 mg via INTRAVENOUS
  Filled 2019-04-01: qty 2

## 2019-04-01 MED ORDER — SODIUM CHLORIDE 0.9 % IV SOLN: 10.0000 mL/h | Freq: Once | INTRAVENOUS | Status: DC

## 2019-04-01 MED ORDER — SODIUM CHLORIDE 0.9% FLUSH: 3.0000 mL | Freq: Once | INTRAVENOUS | Status: DC

## 2019-04-01 MED ORDER — PREDNISONE 20 MG PO TABS: 40.0000 mg | ORAL_TABLET | Freq: Every day | ORAL | 0 refills | Status: AC

## 2019-04-01 NOTE — Discharge Instructions (Addendum)
Your blood levels were low we can transfuse you 1 unit.  Continue to follow closely with your doctors.  Your stool did stain positive for blood so this also could be contributing with your low iron.  Need to follow-up with GI for a colonoscopy.  Return to the ER if you develop worsening shortness of breath or fatigue.  I think your shortness of breath is also secondary to a COPD flare.  We will start you on some steroids and some inhalers.

## 2019-04-01 NOTE — ED Provider Notes (Signed)
-----------------------------------------   8:48 PM on 04/01/2019 -----------------------------------------  Blood pressure (!) 187/97, pulse 66, temperature 98.5 F (36.9 C), temperature source Oral, resp. rate 20, height 5\' 10"  (1.778 m), weight 88.9 kg, SpO2 99 %.  Assuming care from Dr. Jari Pigg.  In short, [name] is a 68 y.o. male with a chief complaint of Shortness of Breath and Chest Pain .  Refer to the original H&P for additional details.  The current plan of care is to transfuse 1U PRBC, also reevaluate SOB after steroids and duonebs. If breathing is improved and ambulatory sats normal, he may be discharged home with close follow-up.  Patient reevaluated after receiving the last of his DuoNeb treatments and steroids.  His wheezing is now much improved and he states his breathing feels much better.  He is starting his blood transfusion now, and I suspect he will be appropriate for discharge home once this is completed.  He will require treatment for COPD exacerbation with steroids and albuterol, prescriptions provided for these by Dr. Jari Pigg.  Patient turned over to oncoming provider pending completion of transfusion.    Blake Divine, MD 04/02/19 [phone]

## 2019-04-01 NOTE — ED Triage Notes (Signed)
Says central chest pain, back pain and sob started yesteray

## 2019-04-01 NOTE — ED Provider Notes (Signed)
[facility] Emergency Department Provider Note  ____________________________________________   First MD Initiated Contact with Patient 04/01/19 1948     (approximate)  I have reviewed the triage vital signs and the nursing notes.   HISTORY  Chief Complaint Shortness of Breath and Chest Pain    HPI [name] is a 68 y.o. male with COPD, CKD, CHF who comes in with shortness of breath with low hemoglobin.  Patient gets transfusions periodically for low iron levels.  He is also currently getting iron transfusions.  He was called and told yesterday that his blood levels were low that he would need a blood transfusion.  With I asked him if he was feeling short of breath he said yes.  Sound like patient has some shortness of breath at baseline but seems to getting worse over the past 1 week with some fatigue.  He had a chest x-ray done a week ago that was concerning for a left lung pneumonia.  Coronavirus testing was negative the patient was put on antibiotics.  He has completed this course but continues to feel little short of breath.  He does not have any overt chest discomfort.  No history of blood clots.  No leg swelling.  Patient does have some wheezing on exam but states that he was never told he had COPD.  He does smoke cigarettes.  He denies any chest pain currently.  His states that is more of the shortness of breath has been bothering him.          Past Medical History:  Diagnosis Date  . Anemia   . Cancer ([place]) 11/02/2014   PERIANAL MASS, RIGHT; INCISIONAL BIOPSY: INVASIVE CARCINOMA WITH BASALOID FEATURES  . CHF (congestive heart failure) ([place])   . Chronic kidney disease   . COPD (chronic obstructive pulmonary disease) ([place])    NO INHALERS  . Diabetes mellitus    NO MEDS  . GERD (gastroesophageal reflux disease)   . Hepatic cirrhosis ([place])   . Hypertension   . Leg swelling   . Loose, teeth    PATIENT STATES "HAS 6 TEETH, SOME ARE LOOSE"  .  Neuromuscular disorder ([place])    RIGHT HAND AND FINGERS NUMB  . Shortness of breath dyspnea    WITH EXERTION   . 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 Chronic kidney disease, stage 4 (severe) ([place]) 01/29/2019  . Hyperparathyroidism due to renal insufficiency ([place]) 01/29/2019  . Proteinuria 01/29/2019  . Sebaceous cyst 09/24/2018  . MGUS (monoclonal gammopathy of unknown significance) 03/27/2018  . Anemia in chronic kidney disease 03/27/2018  . Rotator cuff arthropathy of right shoulder 02/05/2018  . ARF (acute renal failure) ([place]) 01/21/2018  . Mass of perianal area 10/16/2017  . Mass of pharynx 11/09/2016  . Polyp of cecum 11/09/2016  . Chronic hepatitis C without hepatic coma ([place]) 11/09/2016  . Cirrhosis ([place]) 11/09/2016  . Iron deficiency anemia 09/19/2016  . Chest pain with high risk for cardiac etiology 08/13/2016  . SOB (shortness of breath) on exertion 08/13/2016  . Varicose veins of both lower extremities with inflammation 06/11/2016  . Essential hypertension 06/11/2016  . Chronic venous insufficiency 05/08/2016  . Diabetic foot ulcer with osteomyelitis ([place]) 05/08/2016  . Lymphedema 04/12/2016  . Ulcer of great toe ([place]) 04/12/2016  . 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 Anemia 01/26/2016  . Skin mass   .  Lymphadenopathy, inguinal   . Symptomatic anemia 01/24/2016  . Hypoglycemia 09/13/2015  . Internal hemorrhoids 12/30/2014  . Mass of anus 11/01/2014    Past Surgical History:  Procedure Laterality Date  . APPENDECTOMY    . COLONOSCOPY WITH PROPOFOL N/A 11/19/2014   Procedure: COLONOSCOPY WITH PROPOFOL;  Surgeon: Lucilla Lame, MD;  Location: [place];  Service: Endoscopy;  Laterality: N/A;  DIABETIC-ORAL MEDS  . COLONOSCOPY WITH PROPOFOL N/A 10/04/2016   Procedure: COLONOSCOPY WITH PROPOFOL;  Surgeon: Jonathon Bellows, MD;  Location: [facility] ENDOSCOPY;  Service: Endoscopy;   Laterality: N/A;  . ESOPHAGOGASTRODUODENOSCOPY (EGD) WITH PROPOFOL N/A 10/04/2016   Procedure: ESOPHAGOGASTRODUODENOSCOPY (EGD) WITH PROPOFOL;  Surgeon: Jonathon Bellows, MD;  Location: [facility] ENDOSCOPY;  Service: Endoscopy;  Laterality: N/A;  . GIVENS CAPSULE STUDY N/A 11/13/2016   Procedure: GIVENS CAPSULE STUDY;  Surgeon: Jonathon Bellows, MD;  Location: [facility] ENDOSCOPY;  Service: Gastroenterology;  Laterality: N/A;  . INCISION AND DRAINAGE PERIRECTAL ABSCESS Right 12/16/2017   Procedure: EXCISION BASAL CELL CANCER/ GLUTEAL ABSCESS AND FLAP COVERAGE;  Surgeon: Robert Bellow, MD;  Location: [facility] ORS;  Service: General;  Laterality: Right;  . INGUINAL LYMPH NODE BIOPSY Right 01/26/2016   Procedure: INGUINAL LYMPH NODE BIOPSY;  Surgeon: Clayburn Pert, MD;  Location: [facility] ORS;  Service: General;  Laterality: Right;  . RECTAL BIOPSY N/A 11/02/2014   Procedure: BIOPSY RECTAL;  Surgeon: Marlyce Huge, MD;  Location: [facility] ORS;  Service: General;  Laterality: N/A;  . RECTAL EXAM UNDER ANESTHESIA N/A 11/02/2014   Procedure: RECTAL EXAM UNDER ANESTHESIA;  Surgeon: Marlyce Huge, MD;  Location: [facility] ORS;  Service: General;  Laterality: N/A;  . TONSILLECTOMY      Prior to Admission medications   Medication Sig Start Date End Date Taking? Authorizing Provider  amLODipine (NORVASC) 10 MG tablet Take 10 mg by mouth 2 (two) times daily.     [provider]  atorvastatin (LIPITOR) 40 MG tablet Take 40 mg by mouth every morning.     [provider]  betamethasone dipropionate 0.05 % cream Apply topically 2 (two) times daily. 02/24/19   Edrick Kins, DPM  docusate sodium (COLACE) 100 MG capsule Take 1 capsule (100 mg total) by mouth 2 (two) times daily. 01/24/18   Vaughan Basta, MD  ferrous sulfate 325 (65 FE) MG tablet Take 325 mg by mouth daily.     [provider]  furosemide (LASIX) 20 MG tablet Take 20 mg by mouth daily.  07/08/18   [provider]   gentamicin cream (GARAMYCIN) 0.1 % Apply 1 application topically 2 (two) times daily. 02/24/19   Edrick Kins, DPM  hydrALAZINE (APRESOLINE) 25 MG tablet Take 25 mg by mouth daily.     [provider]  omeprazole (PRILOSEC) 40 MG capsule Take 40 mg by mouth every morning.     [provider]  polyethylene glycol (MIRALAX / GLYCOLAX) packet Take 17 g by mouth daily as needed for mild constipation.     [provider]  sodium bicarbonate 650 MG tablet Take 1 tablet (650 mg total) by mouth 3 (three) times daily. Patient taking differently: Take 650 mg by mouth 2 (two) times daily.  01/24/18   Vaughan Basta, MD  sulfamethoxazole-trimethoprim (BACTRIM DS) 800-160 MG tablet Take 1 tablet by mouth 2 (two) times daily. 01/09/19   Edrick Kins, DPM  torsemide (DEMADEX) 20 MG tablet Take 20 mg by mouth daily. 06/03/18   [provider]  vitamin B-12 (CYANOCOBALAMIN) 1000 MCG tablet  Take 1 tablet (1,000 mcg total) by mouth daily. 06/03/17   Sindy Guadeloupe, MD  Vitamin D, Ergocalciferol, (DRISDOL) 50000 units CAPS capsule Take 50,000 Units by mouth every Tuesday.     [provider]    Allergies Patient has no known allergies.  Family History  Problem Relation Age of Onset  . Asthma Mother   . Colon cancer Neg Hx     Social History Social History   Tobacco Use  . Smoking status: Current Every Day Smoker    Packs/day: 0.25    Years: 20.00    Pack years: 5.00    Types: Cigarettes  . Smokeless tobacco: Never Used  . Tobacco comment: 3-4 cigarettes per day  Substance Use Topics  . Alcohol use: Not Currently  . Drug use: Not Currently    Comment: Last Use 2016 per patient-+ UDS FOR COCAINE IN 2014      Review of Systems Constitutional: No fever/chills Eyes: No visual changes. ENT: No sore throat. Cardiovascular: Positive chest pain Respiratory: Positive shortness of breath Gastrointestinal: No abdominal pain.  No nausea, no  vomiting.  No diarrhea.  No constipation. Genitourinary: Negative for dysuria. Musculoskeletal: Negative for back pain. Skin: Negative for rash. Neurological: Negative for headaches, focal weakness or numbness. All other ROS negative ____________________________________________   PHYSICAL EXAM:  VITAL SIGNS: ED Triage Vitals  Enc Vitals Group     BP 04/01/19 1817 (!) 153/67     Pulse Rate 04/01/19 1817 68     Resp 04/01/19 1817 20     Temp 04/01/19 1817 98.5 F (36.9 C)     Temp Source 04/01/19 1817 Oral     SpO2 04/01/19 1817 98 %     Weight 04/01/19 1822 195 lb 15.8 oz (88.9 kg)     Height 04/01/19 1822 5\' 10"  (1.778 m)     Head Circumference --      Peak Flow --      Pain Score 04/01/19 1817 2     Pain Loc --      Pain Edu? --      Excl. in [place]? --     Constitutional: Alert and oriented. Well appearing and in no acute distress. Eyes: Conjunctivae are normal. EOMI. Head: Atraumatic. Nose: No congestion/rhinnorhea. Mouth/Throat: Mucous membranes are moist.   Neck: No stridor. Trachea Midline. FROM Cardiovascular: Normal rate, regular rhythm. Grossly normal heart sounds.  Good peripheral circulation. Respiratory: Normal respiratory effort.  No retractions.  Wheezing noted bilaterally. Gastrointestinal: Soft and nontender. No distention. No abdominal bruits.  Musculoskeletal: No lower extremity tenderness nor edema.  No joint effusions. Neurologic:  Normal speech and language. No gross focal neurologic deficits are appreciated.  Skin:  Skin is warm, dry and intact. No rash noted. Psychiatric: Mood and affect are normal. Speech and behavior are normal. GU: Rectal exam with brown stool but Hemoccult positive Scars from his prior biopsy. ____________________________________________   LABS (all labs ordered are listed, but only abnormal results are displayed)  Labs Reviewed  BASIC METABOLIC PANEL - Abnormal; Notable for the following components:      Result Value   CO2  15 (*)    Glucose, Bld 136 (*)    BUN 51 (*)    Creatinine, Ser 4.13 (*)    Calcium 8.1 (*)    GFR calc non Af Amer 14 (*)    GFR calc Af Amer 16 (*)    All other components within normal limits  CBC - Abnormal; Notable for  the following components:   RBC 2.24 (*)    Hemoglobin 6.7 (*)    HCT 21.6 (*)    All other components within normal limits  TROPONIN I (HIGH SENSITIVITY) - Abnormal; Notable for the following components:   Troponin I (High Sensitivity) 38 (*)    All other components within normal limits  RESPIRATORY PANEL BY RT PCR (FLU A&B, COVID)  PROCALCITONIN  PROCALCITONIN  TYPE AND SCREEN  PREPARE RBC (CROSSMATCH)  TROPONIN I (HIGH SENSITIVITY)   ____________________________________________   ED ECG REPORT I, Vanessa [place], the attending physician, personally viewed and interpreted this ECG.  EKG is normal sinus rate of 65, no ST elevation, no T wave inversion, normal intervals ____________________________________________  RADIOLOGY Robert Bellow, personally viewed and evaluated these images (plain radiographs) as part of my medical decision making, as well as reviewing the written report by the radiologist.  ED MD interpretation: Chest x-ray some trace pleural effusions and atelectasis versus infection the left lung base  Official radiology report(s): DG Chest 2 View  Result Date: 04/01/2019 CLINICAL DATA:  Shortness of breath EXAM: CHEST - 2 VIEW COMPARISON:  January 21, 2018 FINDINGS: There is mild cardiomegaly. Hyperinflation of the lung zones. Mildly increased hazy airspace opacity seen at the left lung base. There are trace bilateral pleural effusions. Aortic knob calcifications. No acute osseous abnormality. IMPRESSION: Mildly increased hazy airspace opacity at the left lung base which could be due to atelectasis and/or early infectious etiology. Trace bilateral pleural effusions Electronically Signed   By: Prudencio Pair M.D.   On: 04/01/2019 19:05     ____________________________________________   PROCEDURES  Procedure(s) performed (including Critical Care):  .Critical Care Performed by: Vanessa [place], MD Authorized by: Vanessa [place], MD   Critical care provider statement:    Critical care time (minutes):  31   Critical care was necessary to treat or prevent imminent or life-threatening deterioration of the following conditions: symptomatic anemia    Critical care was time spent personally by me on the following activities:  Discussions with consultants, evaluation of patient's response to treatment, examination of patient, ordering and performing treatments and interventions, ordering and review of laboratory studies, ordering and review of radiographic studies, pulse oximetry, re-evaluation of patient's condition, obtaining history from patient or surrogate and review of old charts     ____________________________________________   INITIAL IMPRESSION / ASSESSMENT AND PLAN / ED COURSE   [name] was evaluated in Emergency Department on 04/01/2019 for the symptoms described in the history of present illness. He was evaluated in the context of the global COVID-19 pandemic, which necessitated consideration that the patient might be at risk for infection with the SARS-CoV-2 virus that causes COVID-19. Institutional protocols and algorithms that pertain to the evaluation of patients at risk for COVID-19 are in a state of rapid change based on information released by regulatory bodies including the CDC and federal and state organizations. These policies and algorithms were followed during the patient's care in the ED.    Patient presents with worsening anemia acute on chronic.  Given patient's fatigue and shortness of breath we will give him a unit of blood.  Does not seem to be fluid overloaded to suggest CHF exacerbation.  Will get procalcitonin to see if there is still evidence of infection.  Patient does have significant  wheezing on exam so suspect that some of his shortness of breath is from his COPD which she is not on any inhalers for.  Patient states that he is been told he has had wheezing in the past and he continues to smoke.  We will give him some duo nebs and steroids to see if that helps his symptoms.  DDx that was also considered d/t potential to cause harm, but was found less likely based on history and physical (as detailed above): -PNA (no fevers, cough but CXR to evaluate-given recent history will get a procalcitonin -PNX (reassured with equal b/l breath sounds, CXR to evaluate) -Pulmonary embolism as no sob at rest, not pleuritic in nature, no hypoxia and other causes are more likely.  Given his creatinine may be difficult to do a CT scan and he has no leg swelling to suggest DVT. -Aortic Dissection as no tearing pain and no radiation to the mid back, pulses equal -Pericarditis no rub on exam, EKG changes or hx to suggest dx -Tamponade (no notable SOB, tachycardic, hypotensive) -Esophageal rupture (no h/o diffuse vomitting/no crepitus)  Patient is hemoglobin is 6.7 therefore patient ordered unit for transfusion.  His kidney function is elevated at 4.13 which is around his baseline and he is being followed carefully by nephrology.  He is not on dialysis at this time.  Troponin slightly elevated by suspect that this is more likely just elevation secondary to his CKD  Discussed with the oncoming team about treatment for COPD and unit of blood and then reevaluation.  The patient's shortness of breath is resolving he is not desatted with ambulation and his troponin is stable I think patient to go home with some steroids and albuterol versus if he continues to be short of breath bring him into the hospital.   ____________________________________________   FINAL CLINICAL IMPRESSION(S) / ED DIAGNOSES   Final diagnoses:  COPD exacerbation (HCC)  Symptomatic anemia     MEDICATIONS GIVEN DURING  THIS VISIT:  Medications  ipratropium-albuterol (DUONEB) 0.5-2.5 (3) MG/3ML nebulizer solution 3 mL (has no administration in time range)  ipratropium-albuterol (DUONEB) 0.5-2.5 (3) MG/3ML nebulizer solution 3 mL (has no administration in time range)  ipratropium-albuterol (DUONEB) 0.5-2.5 (3) MG/3ML nebulizer solution 3 mL (has no administration in time range)  methylPREDNISolone sodium succinate (SOLU-MEDROL) 125 mg/2 mL injection 80 mg (has no administration in time range)  0.9 %  sodium chloride infusion (has no administration in time range)     ED Discharge Orders    None       Note:  This document was prepared using Dragon voice recognition software and may include unintentional dictation errors.   Vanessa , MD 04/01/19 2116

## 2019-04-02 LAB — TYPE AND SCREEN
ABO/RH(D): A POS
Antibody Screen: NEGATIVE
Unit division: 0

## 2019-04-02 LAB — BPAM RBC
Blood Product Expiration Date: 202101192359
ISSUE DATE / TIME: 202012232210
Unit Type and Rh: 6200

## 2019-04-07 ENCOUNTER — Encounter: Payer: Self-pay | Admitting: Podiatry

## 2019-04-07 ENCOUNTER — Ambulatory Visit: Payer: Medicare Other | Admitting: Podiatry

## 2019-04-07 ENCOUNTER — Other Ambulatory Visit: Payer: Self-pay

## 2019-04-07 ENCOUNTER — Ambulatory Visit (INDEPENDENT_AMBULATORY_CARE_PROVIDER_SITE_OTHER): Payer: Medicare Other | Admitting: Podiatry

## 2019-04-07 DIAGNOSIS — L97512 Non-pressure chronic ulcer of other part of right foot with fat layer exposed: Secondary | ICD-10-CM | POA: Diagnosis not present

## 2019-04-07 DIAGNOSIS — E0843 Diabetes mellitus due to underlying condition with diabetic autonomic (poly)neuropathy: Secondary | ICD-10-CM

## 2019-04-13 NOTE — Progress Notes (Signed)
   HPI: 69 y.o. male presenting today for follow-up evaluation regarding ulcerations of the right great toe and right 2nd toe. He states he is doing well and improving. He has been using the Gentamicin and Betamethasone creams as directed. He denies any aggravating factors at this time. Patient is here for further evaluation and treatment.   Past Medical History:  Diagnosis Date  . Anemia   . 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 CHF (congestive heart failure) ([place])   . Chronic kidney disease   . COPD (chronic obstructive pulmonary disease) ([place])    NO INHALERS  . Diabetes mellitus    NO MEDS  . GERD (gastroesophageal reflux disease)   . Hepatic cirrhosis ([place])   . Hypertension   . Leg swelling   . Loose, teeth    PATIENT STATES "HAS 6 TEETH, SOME ARE LOOSE"  . Neuromuscular disorder ([place])    RIGHT HAND AND FINGERS NUMB  . Shortness of breath dyspnea    WITH EXERTION   . 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  Dermatology: Wound #1 noted to the right hallux measuring approximately 1.5 x 1.0 x 0.2 cm.   Wound #2 noted to the right second toe measuring approximately 0.3 x 0.3 x 0.1 cm.   To the above-noted ulceration, there is no eschar. There is a moderate amount of slough, fibrin and necrotic tissue. Granulation tissue and wound base is red. There is no malodor. There is a minimal amount of serosanginous drainage noted. Periwound integrity is intact.   Vascular: Palpable pedal pulses bilaterally.  Edema noted to the right hallux and second toe.  Neurological: Epicritic and protective threshold absent bilaterally.   Musculoskeletal Exam: Range of motion within normal limits to all pedal and ankle joints bilateral. Muscle strength 5/5 in all groups bilateral.   Assessment: 1. Ulceration right hallux  secondary to diabetes mellitus  2. Ulceration right 2nd toe secondary to diabetes mellitus  2. Diabetes mellitus with peripheral polyneuropathy: Uncontrolled   Plan of Care:  1. Patient evaluated.   2. Medically necessary excisional debridement including subcutaneous tissue was performed using a tissue nipper and a chisel blade. Excisional debridement of all the necrotic nonviable tissue down to healthy bleeding viable tissue was performed with post-debridement measurements same as pre-. 3. The wound was cleansed and dry sterile dressing applied. 4. Continue the combination mixture of gentamicin cream and betamethasone 0.05% cream  5. Return to clinic in 4 weeks.      Edrick Kins, DPM [facility]  Dr. Edrick Kins, DPM    [address], [place] [address]                Office [phone]  Fax [phone]

## 2019-04-14 ENCOUNTER — Other Ambulatory Visit: Payer: Self-pay

## 2019-04-14 ENCOUNTER — Inpatient Hospital Stay: Payer: Medicare Other

## 2019-04-14 ENCOUNTER — Inpatient Hospital Stay: Payer: Medicare Other | Attending: Oncology

## 2019-04-14 ENCOUNTER — Emergency Department: Payer: Medicare Other

## 2019-04-14 ENCOUNTER — Encounter: Payer: Self-pay | Admitting: Emergency Medicine

## 2019-04-14 ENCOUNTER — Emergency Department
Admission: EM | Admit: 2019-04-14 | Discharge: 2019-04-14 | Disposition: A | Discharge status: Home / Self Care | Payer: Medicare Other | Attending: Emergency Medicine | Admitting: Emergency Medicine

## 2019-04-14 DIAGNOSIS — I130 Hypertensive heart and chronic kidney disease with heart failure and stage 1 through stage 4 chronic kidney disease, or unspecified chronic kidney disease: Secondary | ICD-10-CM | POA: Insufficient documentation

## 2019-04-14 DIAGNOSIS — Z79899 Other long term (current) drug therapy: Secondary | ICD-10-CM | POA: Diagnosis not present

## 2019-04-14 DIAGNOSIS — N184 Chronic kidney disease, stage 4 (severe): Secondary | ICD-10-CM | POA: Insufficient documentation

## 2019-04-14 DIAGNOSIS — D649 Anemia, unspecified: Secondary | ICD-10-CM

## 2019-04-14 DIAGNOSIS — C44520 Squamous cell carcinoma of anal skin: Secondary | ICD-10-CM | POA: Insufficient documentation

## 2019-04-14 DIAGNOSIS — I509 Heart failure, unspecified: Secondary | ICD-10-CM | POA: Insufficient documentation

## 2019-04-14 DIAGNOSIS — F17210 Nicotine dependence, cigarettes, uncomplicated: Secondary | ICD-10-CM | POA: Insufficient documentation

## 2019-04-14 DIAGNOSIS — J449 Chronic obstructive pulmonary disease, unspecified: Secondary | ICD-10-CM

## 2019-04-14 DIAGNOSIS — I10 Essential (primary) hypertension: Secondary | ICD-10-CM

## 2019-04-14 DIAGNOSIS — D631 Anemia in chronic kidney disease: Secondary | ICD-10-CM

## 2019-04-14 LAB — SAMPLE TO BLOOD BANK

## 2019-04-14 LAB — BASIC METABOLIC PANEL
Anion gap: 9 (ref 5–15)
BUN: 45 mg/dL — ABNORMAL HIGH (ref 8–23)
CO2: 17 mmol/L — ABNORMAL LOW (ref 22–32)
Calcium: 8 mg/dL — ABNORMAL LOW (ref 8.9–10.3)
Chloride: 106 mmol/L (ref 98–111)
Creatinine, Ser: 4.38 mg/dL — ABNORMAL HIGH (ref 0.61–1.24)
GFR calc Af Amer: 15 mL/min — ABNORMAL LOW (ref >60)
GFR calc non Af Amer: 13 mL/min — ABNORMAL LOW (ref >60)
Glucose, Bld: 107 mg/dL — ABNORMAL HIGH (ref 70–99)
Potassium: 5.2 mmol/L — ABNORMAL HIGH (ref 3.5–5.1)
Sodium: 132 mmol/L — ABNORMAL LOW (ref 135–145)

## 2019-04-14 LAB — CBC
HCT: 26.4 % — ABNORMAL LOW (ref 39.0–52.0)
HCT: 27.6 % — ABNORMAL LOW (ref 39.0–52.0)
Hemoglobin: 8.2 g/dL — ABNORMAL LOW (ref 13.0–17.0)
Hemoglobin: 8.6 g/dL — ABNORMAL LOW (ref 13.0–17.0)
MCH: 29.8 pg (ref 26.0–34.0)
MCH: 29.6 pg (ref 26.0–34.0)
MCHC: 31.1 g/dL (ref 30.0–36.0)
MCHC: 31.2 g/dL (ref 30.0–36.0)
MCV: 96 fL (ref 80.0–100.0)
MCV: 94.8 fL (ref 80.0–100.0)
Platelets: 134 10*3/uL — ABNORMAL LOW (ref 150–400)
Platelets: 136 10*3/uL — ABNORMAL LOW (ref 150–400)
RBC: 2.75 MIL/uL — ABNORMAL LOW (ref 4.22–5.81)
RBC: 2.91 MIL/uL — ABNORMAL LOW (ref 4.22–5.81)
RDW: 14.1 % (ref 11.5–15.5)
RDW: 14.1 % (ref 11.5–15.5)
WBC: 5.9 10*3/uL (ref 4.0–10.5)
WBC: 5.6 10*3/uL (ref 4.0–10.5)
nRBC: 0 % (ref 0.0–0.2)
nRBC: 0 % (ref 0.0–0.2)

## 2019-04-14 MED ORDER — PREDNISONE 20 MG PO TABS: 40.0000 mg | ORAL_TABLET | Freq: Every day | ORAL | 0 refills | Status: DC

## 2019-04-14 MED ORDER — AMLODIPINE BESYLATE 5 MG PO TABS
10.0000 mg | ORAL_TABLET | Freq: Once | ORAL | Status: AC
  Administered 2019-04-14: 10 mg via ORAL
  Filled 2019-04-14: qty 2

## 2019-04-14 MED ORDER — IPRATROPIUM-ALBUTEROL 0.5-2.5 (3) MG/3ML IN SOLN
3.0000 mL | Freq: Once | RESPIRATORY_TRACT | Status: AC
  Administered 2019-04-14: 15:00:00 3 mL via RESPIRATORY_TRACT
  Filled 2019-04-14: qty 9

## 2019-04-14 MED ORDER — PREDNISONE 20 MG PO TABS
60.0000 mg | ORAL_TABLET | Freq: Once | ORAL | Status: AC
  Administered 2019-04-14: 60 mg via ORAL
  Filled 2019-04-14: qty 3

## 2019-04-14 MED ORDER — METHYLPREDNISOLONE SODIUM SUCC 125 MG IJ SOLR: 125.0000 mg | Freq: Once | INTRAMUSCULAR | Status: DC

## 2019-04-14 MED ORDER — IPRATROPIUM-ALBUTEROL 0.5-2.5 (3) MG/3ML IN SOLN
3.0000 mL | Freq: Once | RESPIRATORY_TRACT | Status: AC
  Administered 2019-04-14: 15:00:00 3 mL via RESPIRATORY_TRACT
  Filled 2019-04-14: qty 3

## 2019-04-14 MED ORDER — IPRATROPIUM-ALBUTEROL 0.5-2.5 (3) MG/3ML IN SOLN
3.0000 mL | Freq: Once | RESPIRATORY_TRACT | Status: AC
  Administered 2019-04-14: 3 mL via RESPIRATORY_TRACT
  Filled 2019-04-14: qty 3

## 2019-04-14 NOTE — ED Triage Notes (Signed)
Pt here with c/o hypertension today, was supposed to get iron shot this am, but Dr cancer [facility] sent him here to be evaluated for HTN. Does take bp med and home and took this am. Pt denies cp. NAD. Is being treated for colon ca.

## 2019-04-14 NOTE — ED Notes (Addendum)
Pt st "did not take my BP pills, just my heart pill". This RN review pt meds, pt st not taking amlodopine this morning.

## 2019-04-14 NOTE — ED Notes (Addendum)
Pt c/o SHOB on exertion for 1 weeks. Pt st taking his inhaler at home w/o relief.pt sitting on tripod position on stretcher. Pt placed on stretcher safely by this RN.  Denies cough, fever. N/V/D.   Pt sat 98% on RA. HR 71.

## 2019-04-14 NOTE — ED Notes (Signed)
Full rainbow and pink tube drawn.

## 2019-04-14 NOTE — Progress Notes (Signed)
There was a delay in getting results of hgb and he does need a retacrit today. I checked his b/p and it was 192/92 on the b/p machine, I checked it manually and it was 200-140. Pt has been feeling sob on exertion and he started feeling like he was wheezing day before christmas and felt some tightness in chest- he went to PCP who saw him and gave him 5 different inhalers per pt statement.  I told him he could not get shot today based on his b/p and he did take all b/p meds today in am before coming here at 11:45.  I told him that dr Janese Banks states he should go to ER.  The pt. Did  Not want to go but he said that his doctor office would see him if he would go over there. I put him in chair and wheeled him to the door. I spoke with his cousin Theodoro Doing and told him about b/p and wheezing and his cousin said the same thing that he had been feeling above and they would either take him to ER or go over to PCP office with him

## 2019-04-14 NOTE — ED Provider Notes (Signed)
[facility] Emergency Department Provider Note  ____________________________________________   First MD Initiated Contact with Patient 04/14/19 1440     (approximate)  I have reviewed the triage vital signs and the nursing notes.   HISTORY  Chief Complaint Hypertension    HPI [name] is a 69 y.o. male with anemia, CHF, COPD, basal cell carcinoma of the right buttock status post excision in 2019 and radiation therapy not currently on any chemotherapy who comes in with elevated blood pressure.  Patient was put to get an iron shot this morning but was sent here due to elevated blood pressure.  Patient states that he took his carvedilol but he did not take his torsemide or his amlodipine.  He denies any symptoms such as chest pain, headache.  Review of records and his blood pressures are always elevated.  He states he does have some shortness of breath at baseline but seems to be worse.  Was initially seen here on 12/23 seen by myself and states he felt better after the breathing treatments and the steroids but his shortness of breath has gotten worse over the past few days, moderate.  Denies any unilateral leg swelling, recent surgery, recent immobilization, prior history of PE, hemoptysis.            Past Medical History:  Diagnosis Date  . Anemia   . Cancer ([place]) 11/02/2014   PERIANAL MASS, RIGHT; INCISIONAL BIOPSY: INVASIVE CARCINOMA WITH BASALOID FEATURES  . CHF (congestive heart failure) ([place])   . Chronic kidney disease   . COPD (chronic obstructive pulmonary disease) ([place])    NO INHALERS  . Diabetes mellitus    NO MEDS  . GERD (gastroesophageal reflux disease)   . Hepatic cirrhosis ([place])   . Hypertension   . Leg swelling   . Loose, teeth    PATIENT STATES "HAS 6 TEETH, SOME ARE LOOSE"  . Neuromuscular disorder ([place])    RIGHT HAND AND FINGERS NUMB  . Shortness of breath dyspnea    WITH EXERTION   . 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 Chronic kidney disease, stage 4 (severe) ([place]) 01/29/2019  . Hyperparathyroidism due to renal insufficiency ([place]) 01/29/2019  . Proteinuria 01/29/2019  . Sebaceous cyst 09/24/2018  . MGUS (monoclonal gammopathy of unknown significance) 03/27/2018  . Anemia in chronic kidney disease 03/27/2018  . Rotator cuff arthropathy of right shoulder 02/05/2018  . ARF (acute renal failure) ([place]) 01/21/2018  . Mass of perianal area 10/16/2017  . Mass of pharynx 11/09/2016  . Polyp of cecum 11/09/2016  . Chronic hepatitis C without hepatic coma ([place]) 11/09/2016  . Cirrhosis ([place]) 11/09/2016  . Iron deficiency anemia 09/19/2016  . Chest pain with high risk for cardiac etiology 08/13/2016  . SOB (shortness of breath) on exertion 08/13/2016  . Varicose veins of both lower extremities with inflammation 06/11/2016  . Essential hypertension 06/11/2016  . Chronic venous insufficiency 05/08/2016  . Diabetic foot ulcer with osteomyelitis ([place]) 05/08/2016  . Lymphedema 04/12/2016  . Ulcer of great toe ([place]) 04/12/2016  . 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 Anemia 01/26/2016  . Skin mass   . Lymphadenopathy, inguinal   . Symptomatic anemia 01/24/2016  . Hypoglycemia 09/13/2015  . Internal hemorrhoids 12/30/2014  . Mass of anus 11/01/2014    Past Surgical History:  Procedure Laterality Date  . APPENDECTOMY    .  COLONOSCOPY WITH PROPOFOL N/A 11/19/2014   Procedure: COLONOSCOPY WITH PROPOFOL;  Surgeon: Lucilla Lame, MD;  Location: [facility];  Service: Endoscopy;  Laterality: N/A;  DIABETIC-ORAL MEDS  . COLONOSCOPY WITH PROPOFOL N/A 10/04/2016   Procedure: COLONOSCOPY WITH PROPOFOL;  Surgeon: Jonathon Bellows, MD;  Location: [facility] ENDOSCOPY;  Service: Endoscopy;  Laterality: N/A;  . ESOPHAGOGASTRODUODENOSCOPY (EGD) WITH PROPOFOL N/A 10/04/2016   Procedure: ESOPHAGOGASTRODUODENOSCOPY (EGD) WITH PROPOFOL;   Surgeon: Jonathon Bellows, MD;  Location: [facility] ENDOSCOPY;  Service: Endoscopy;  Laterality: N/A;  . GIVENS CAPSULE STUDY N/A 11/13/2016   Procedure: GIVENS CAPSULE STUDY;  Surgeon: Jonathon Bellows, MD;  Location: [facility] ENDOSCOPY;  Service: Gastroenterology;  Laterality: N/A;  . INCISION AND DRAINAGE PERIRECTAL ABSCESS Right 12/16/2017   Procedure: EXCISION BASAL CELL CANCER/ GLUTEAL ABSCESS AND FLAP COVERAGE;  Surgeon: Robert Bellow, MD;  Location: [facility] ORS;  Service: General;  Laterality: Right;  . INGUINAL LYMPH NODE BIOPSY Right 01/26/2016   Procedure: INGUINAL LYMPH NODE BIOPSY;  Surgeon: Clayburn Pert, MD;  Location: [facility] ORS;  Service: General;  Laterality: Right;  . RECTAL BIOPSY N/A 11/02/2014   Procedure: BIOPSY RECTAL;  Surgeon: Marlyce Huge, MD;  Location: [facility] ORS;  Service: General;  Laterality: N/A;  . RECTAL EXAM UNDER ANESTHESIA N/A 11/02/2014   Procedure: RECTAL EXAM UNDER ANESTHESIA;  Surgeon: Marlyce Huge, MD;  Location: [facility] ORS;  Service: General;  Laterality: N/A;  . TONSILLECTOMY      Prior to Admission medications   Medication Sig Start Date End Date Taking? Authorizing Provider  albuterol (VENTOLIN HFA) 108 (90 Base) MCG/ACT inhaler Inhale 2 puffs into the lungs every 6 (six) hours as needed for up to 7 days for wheezing or shortness of breath. 04/01/19 04/08/19  Vanessa Crump, MD  allopurinol (ZYLOPRIM) 100 MG tablet Take 100 mg by mouth daily. 03/20/19   [provider]  amLODipine (NORVASC) 10 MG tablet Take 10 mg by mouth 2 (two) times daily.     [provider]  atorvastatin (LIPITOR) 40 MG tablet Take 40 mg by mouth every morning.     [provider]  betamethasone dipropionate 0.05 % cream Apply topically 2 (two) times daily. 02/24/19   Edrick Kins, DPM  carvedilol (COREG) 3.125 MG tablet Take 3.125 mg by mouth 2 (two) times daily. 03/31/19   [provider]  docusate sodium (COLACE) 100 MG capsule Take 1 capsule  (100 mg total) by mouth 2 (two) times daily. 01/24/18   Vaughan Basta, MD  ferrous sulfate 325 (65 FE) MG tablet Take 325 mg by mouth daily.     [provider]  furosemide (LASIX) 20 MG tablet Take 20 mg by mouth daily.  07/08/18   [provider]  gentamicin cream (GARAMYCIN) 0.1 % Apply 1 application topically 2 (two) times daily. 02/24/19   Edrick Kins, DPM  hydrALAZINE (APRESOLINE) 25 MG tablet Take 25 mg by mouth daily.     [provider]  omeprazole (PRILOSEC) 40 MG capsule Take 40 mg by mouth every morning.     [provider]  polyethylene glycol (MIRALAX / GLYCOLAX) packet Take 17 g by mouth daily as needed for mild constipation.     [provider]  sodium bicarbonate 650 MG tablet Take 1 tablet (650 mg total) by mouth 3 (three) times daily. Patient taking differently: Take 650 mg by mouth 2 (two) times daily.  01/24/18   Vaughan Basta, MD  sulfamethoxazole-trimethoprim (BACTRIM DS) 800-160 MG tablet Take 1 tablet  by mouth 2 (two) times daily. 01/09/19   Edrick Kins, DPM  torsemide (DEMADEX) 20 MG tablet Take 20 mg by mouth daily. 06/03/18   [provider]  vitamin B-12 (CYANOCOBALAMIN) 1000 MCG tablet Take 1 tablet (1,000 mcg total) by mouth daily. 06/03/17   Sindy Guadeloupe, MD  Vitamin D, Ergocalciferol, (DRISDOL) 50000 units CAPS capsule Take 50,000 Units by mouth every Tuesday.     [provider]    Allergies Patient has no known allergies.  Family History  Problem Relation Age of Onset  . Asthma Mother   . Colon cancer Neg Hx     Social History Social History   Tobacco Use  . Smoking status: Current Every Day Smoker    Packs/day: 0.25    Years: 20.00    Pack years: 5.00    Types: Cigarettes  . Smokeless tobacco: Never Used  . Tobacco comment: 3-4 cigarettes per day  Substance Use Topics  . Alcohol use: Not Currently  . Drug use: Not Currently    Comment: Last Use 2016 per  patient-+ UDS FOR COCAINE IN 2014      Review of Systems Constitutional: No fever/chills Eyes: No visual changes. ENT: No sore throat. Cardiovascular: Denies chest pain. Respiratory: Positive shortness of breath Gastrointestinal: No abdominal pain.  No nausea, no vomiting.  No diarrhea.  No constipation. Genitourinary: Negative for dysuria. Musculoskeletal: Negative for back pain. Skin: Negative for rash. Neurological: Negative for headaches, focal weakness or numbness. All other ROS negative ____________________________________________   PHYSICAL EXAM:  VITAL SIGNS: ED Triage Vitals  Enc Vitals Group     BP 04/14/19 1345 (!) 188/95     Pulse Rate 04/14/19 1345 72     Resp 04/14/19 1345 16     Temp 04/14/19 1345 98.2 F (36.8 C)     Temp Source 04/14/19 1345 Oral     SpO2 04/14/19 1345 97 %     Weight 04/14/19 1348 180 lb (81.6 kg)     Height 04/14/19 1348 5\' 10"  (1.778 m)     Head Circumference --      Peak Flow --      Pain Score 04/14/19 1348 0     Pain Loc --      Pain Edu? --      Excl. in [place]? --     Constitutional: Alert and oriented. Well appearing and in no acute distress. Eyes: Conjunctivae are normal. EOMI. Head: Atraumatic. Nose: No congestion/rhinnorhea. Mouth/Throat: Mucous membranes are moist.   Neck: No stridor. Trachea Midline. FROM Cardiovascular: Normal rate, regular rhythm. Grossly normal heart sounds.  Good peripheral circulation. Respiratory: Wheezing bilaterally, no increased work of breathing Gastrointestinal: Soft and nontender. No distention. No abdominal bruits.  Musculoskeletal: Mild edema bilaterally.  No joint effusions. Neurologic:  Normal speech and language. No gross focal neurologic deficits are appreciated.  Skin:  Skin is warm, dry and intact. No rash noted. Psychiatric: Mood and affect are normal. Speech and behavior are normal. GU: Deferred   ____________________________________________   LABS (all labs ordered are  listed, but only abnormal results are displayed)  Labs Reviewed  BASIC METABOLIC PANEL - Abnormal; Notable for the following components:      Result Value   Sodium 132 (*)    Potassium 5.2 (*)    CO2 17 (*)    Glucose, Bld 107 (*)    BUN 45 (*)    Creatinine, Ser 4.38 (*)    Calcium 8.0 (*)  GFR calc non Af Amer 13 (*)    GFR calc Af Amer 15 (*)    All other components within normal limits  CBC - Abnormal; Notable for the following components:   RBC 2.91 (*)    Hemoglobin 8.6 (*)    HCT 27.6 (*)    Platelets 136 (*)    All other components within normal limits  SAMPLE TO BLOOD BANK   ____________________________________________   ED ECG REPORT I, Vanessa Spearman, the attending physician, personally viewed and interpreted this ECG.  EKG is sinus being read as a rate of 142 think that this is just secondary to the large T waves please the right seems to be more like 60s to 70s.  No ST elevation, no T wave inversions, normal intervals.  Review of prior EKG shows large T waves as well ____________________________________________  RADIOLOGY Robert Bellow, personally viewed and evaluated these images (plain radiographs) as part of my medical decision making, as well as reviewing the written report by the radiologist.  ED MD interpretation: No evidence of pneumonia  Official radiology report(s): DG Chest 2 View  Result Date: 04/14/2019 CLINICAL DATA:  Dyspnea on exertion for 1 week EXAM: CHEST - 2 VIEW COMPARISON:  04/01/2019 FINDINGS: Cardiac shadow is stable. Aortic calcifications are again seen. The lungs are well aerated bilaterally. No focal infiltrate or sizable effusion is seen. Chronic blunting of the right costophrenic angle is noted. Interval improved aeration in the left base is seen. IMPRESSION: Chronic blunting of the right costophrenic angle. No other focal abnormality is seen. Electronically Signed   By: Inez Catalina M.D.   On: 04/14/2019 15:22     ____________________________________________   PROCEDURES  Procedure(s) performed (including Critical Care):  Procedures   ____________________________________________   INITIAL IMPRESSION / ASSESSMENT AND PLAN / ED COURSE  [name] was evaluated in Emergency Department on 04/14/2019 for the symptoms described in the history of present illness. He was evaluated in the context of the global COVID-19 pandemic, which necessitated consideration that the patient might be at risk for infection with the SARS-CoV-2 virus that causes COVID-19. Institutional protocols and algorithms that pertain to the evaluation of patients at risk for COVID-19 are in a state of rapid change based on information released by regulatory bodies including the CDC and federal and state organizations. These policies and algorithms were followed during the patient's care in the ED.    Patient is a 69 year old who comes in with elevated blood pressure when turning an iron transfusion.  He is no evidence of endorgan damage given he is asymptomatic from it and I reviewed his prior blood pressures and he typically runs elevated in the 160s to 190s.  Will get labs to evaluate for worsening kidney function from his baseline aslthough pt is known severe CKD not on dialysis.  Denies any chest pain to suggest ACS, dissection.  No headache to suggest intracranial hemorrhage.  Patient does report some shortness of breath I think most likely related to COPD given he does have bilateral wheezing.  Will give some breathing treatments and steroids since he says that this helped the most in the past.  Will get chest x-ray to make sure is no evidence of fluid overload, pneumonia.  Low suspicion for Covid given afebrile, x-ray without evidence of viral pneumonia and he was recently here for similar and it was secondary to COPD. considered PE but I have lower suspicion given his lung exam and patient is not  able to undergo a CT scan due to  his elevated kidney function.  He denies any risk factors for PE.  Will get labs to evaluate for his anemia.  Hemoglobin is better than a month ago does not require transfusion.  BMP shows a kidney function of 4.38 slightly elevated from 4.13 with a K of 5.2. T waves look enlarged but reviewed from prior EKGS look very similar and I do not think this is caused by the K of 5.2.  Seems to be pretty close to patient's baseline.  Patient has not taken his amlodipine or his torsemide so we will give a dose of the amlodipine here.  I expressed that to patient that he needs to follow-up with his nephrologist to discuss his elevated blood pressures and that he may need to be started on more medications.   3:59 PM reevaluated patient and his shortness of breath is much better after the DuoNeb's.  We discussed admission versus discharge home and patient would prefer to go home at this time.  EKG shows sinus rate of 99 no st elevation, t waves enlarged but similar to prior, qtc slightly long at 513   4:36 PM reevaluated patient.  Patient ambulated with sats 94 to 97%.  He did feel little short of breath during it.  I discussed admission versus going home the patient preferred to go home.  He has inhalers at home.  He will follow-up with his primary care doctor and his nephrologist.  I discussed the provisional nature of ED diagnosis, the treatment so far, the ongoing plan of care, follow up appointments and return precautions with the patient and any family or support people present. They expressed understanding and agreed with the plan, discharged home.  ____________________________________________   FINAL CLINICAL IMPRESSION(S) / ED DIAGNOSES   Final diagnoses:  Hypertension, unspecified type  Chronic obstructive pulmonary disease, unspecified COPD type ([place])      MEDICATIONS GIVEN DURING THIS VISIT:  Medications  amLODipine (NORVASC) tablet 10 mg (10 mg Oral Given 04/14/19 1522)   ipratropium-albuterol (DUONEB) 0.5-2.5 (3) MG/3ML nebulizer solution 3 mL (3 mLs Nebulization Given 04/14/19 1523)  ipratropium-albuterol (DUONEB) 0.5-2.5 (3) MG/3ML nebulizer solution 3 mL (3 mLs Nebulization Given 04/14/19 1523)  ipratropium-albuterol (DUONEB) 0.5-2.5 (3) MG/3ML nebulizer solution 3 mL (3 mLs Nebulization Given 04/14/19 1523)  predniSONE (DELTASONE) tablet 60 mg (60 mg Oral Given 04/14/19 1522)     ED Discharge Orders         Ordered    predniSONE (DELTASONE) 20 MG tablet  Daily     04/14/19 1609           Note:  This document was prepared using Dragon voice recognition software and may include unintentional dictation errors.   Vanessa Trout[place], MD 04/14/19 [phone]

## 2019-04-14 NOTE — ED Notes (Addendum)
This RN ambulated pt to bathroom with a steady gait. Pt sat 94% RA. Pt c/o SHOB a this time. Pt placed on stretcher safely at this time.

## 2019-04-14 NOTE — Discharge Instructions (Addendum)
It is important you follow-up with your kidney doctor to discuss your elevated blood pressures.  They have been elevated for some time now and this can worsen your kidney function.  You should have a follow-up with them within a week.  Take your blood pressure medicines with you so they can see exactly what you are taking and adjust further.  For your shortness of breath take the steroids and continue your albuterol.  Follow-up with your primary care doctor for this. You may need to be referred to pulmonary doctor.

## 2019-04-19 ENCOUNTER — Other Ambulatory Visit: Payer: Self-pay

## 2019-04-19 ENCOUNTER — Inpatient Hospital Stay
Admission: EM | Admit: 2019-04-19 | Discharge: 2019-04-22 | DRG: 177 | Disposition: A | Discharge status: Home Health | Payer: Medicare Other | Attending: Internal Medicine | Admitting: Internal Medicine

## 2019-04-19 ENCOUNTER — Emergency Department: Payer: Medicare Other

## 2019-04-19 DIAGNOSIS — Z7952 Long term (current) use of systemic steroids: Secondary | ICD-10-CM | POA: Diagnosis not present

## 2019-04-19 DIAGNOSIS — I152 Hypertension secondary to endocrine disorders: Secondary | ICD-10-CM | POA: Diagnosis present

## 2019-04-19 DIAGNOSIS — J9601 Acute respiratory failure with hypoxia: Secondary | ICD-10-CM | POA: Diagnosis present

## 2019-04-19 DIAGNOSIS — Z825 Family history of asthma and other chronic lower respiratory diseases: Secondary | ICD-10-CM

## 2019-04-19 DIAGNOSIS — F17210 Nicotine dependence, cigarettes, uncomplicated: Secondary | ICD-10-CM | POA: Diagnosis present

## 2019-04-19 DIAGNOSIS — J441 Chronic obstructive pulmonary disease with (acute) exacerbation: Secondary | ICD-10-CM

## 2019-04-19 DIAGNOSIS — D472 Monoclonal gammopathy: Secondary | ICD-10-CM | POA: Diagnosis present

## 2019-04-19 DIAGNOSIS — J1282 Pneumonia due to coronavirus disease 2019: Secondary | ICD-10-CM | POA: Diagnosis present

## 2019-04-19 DIAGNOSIS — Z79899 Other long term (current) drug therapy: Secondary | ICD-10-CM

## 2019-04-19 DIAGNOSIS — E1169 Type 2 diabetes mellitus with other specified complication: Secondary | ICD-10-CM | POA: Diagnosis present

## 2019-04-19 DIAGNOSIS — K219 Gastro-esophageal reflux disease without esophagitis: Secondary | ICD-10-CM | POA: Diagnosis present

## 2019-04-19 DIAGNOSIS — U071 COVID-19: Secondary | ICD-10-CM | POA: Diagnosis present

## 2019-04-19 DIAGNOSIS — J189 Pneumonia, unspecified organism: Secondary | ICD-10-CM

## 2019-04-19 DIAGNOSIS — J440 Chronic obstructive pulmonary disease with acute lower respiratory infection: Secondary | ICD-10-CM | POA: Diagnosis present

## 2019-04-19 DIAGNOSIS — K7460 Unspecified cirrhosis of liver: Secondary | ICD-10-CM | POA: Diagnosis present

## 2019-04-19 DIAGNOSIS — E1122 Type 2 diabetes mellitus with diabetic chronic kidney disease: Secondary | ICD-10-CM | POA: Diagnosis present

## 2019-04-19 DIAGNOSIS — E1159 Type 2 diabetes mellitus with other circulatory complications: Secondary | ICD-10-CM | POA: Diagnosis present

## 2019-04-19 DIAGNOSIS — N184 Chronic kidney disease, stage 4 (severe): Secondary | ICD-10-CM | POA: Diagnosis present

## 2019-04-19 DIAGNOSIS — I130 Hypertensive heart and chronic kidney disease with heart failure and stage 1 through stage 4 chronic kidney disease, or unspecified chronic kidney disease: Secondary | ICD-10-CM | POA: Diagnosis present

## 2019-04-19 DIAGNOSIS — D631 Anemia in chronic kidney disease: Secondary | ICD-10-CM | POA: Diagnosis present

## 2019-04-19 DIAGNOSIS — D509 Iron deficiency anemia, unspecified: Secondary | ICD-10-CM | POA: Diagnosis present

## 2019-04-19 DIAGNOSIS — I509 Heart failure, unspecified: Secondary | ICD-10-CM | POA: Diagnosis present

## 2019-04-19 DIAGNOSIS — E119 Type 2 diabetes mellitus without complications: Secondary | ICD-10-CM

## 2019-04-19 LAB — TROPONIN I (HIGH SENSITIVITY)
Troponin I (High Sensitivity): 27 ng/L — ABNORMAL HIGH (ref <18)
Troponin I (High Sensitivity): 26 ng/L — ABNORMAL HIGH (ref <18)

## 2019-04-19 LAB — CBC WITH DIFFERENTIAL/PLATELET
Abs Immature Granulocytes: 0.1 10*3/uL — ABNORMAL HIGH (ref 0.00–0.07)
Basophils Absolute: 0 10*3/uL (ref 0.0–0.1)
Basophils Relative: 0 %
Eosinophils Absolute: 0 10*3/uL (ref 0.0–0.5)
Eosinophils Relative: 0 %
HCT: 27.1 % — ABNORMAL LOW (ref 39.0–52.0)
Hemoglobin: 8.9 g/dL — ABNORMAL LOW (ref 13.0–17.0)
Immature Granulocytes: 1 %
Lymphocytes Relative: 10 %
Lymphs Abs: 0.9 10*3/uL (ref 0.7–4.0)
MCH: 30 pg (ref 26.0–34.0)
MCHC: 32.8 g/dL (ref 30.0–36.0)
MCV: 91.2 fL (ref 80.0–100.0)
Monocytes Absolute: 1.7 10*3/uL — ABNORMAL HIGH (ref 0.1–1.0)
Monocytes Relative: 18 %
Neutro Abs: 6.7 10*3/uL (ref 1.7–7.7)
Neutrophils Relative %: 71 %
Platelets: 183 10*3/uL (ref 150–400)
RBC: 2.97 MIL/uL — ABNORMAL LOW (ref 4.22–5.81)
RDW: 13.1 % (ref 11.5–15.5)
WBC: 9.4 10*3/uL (ref 4.0–10.5)
nRBC: 0 % (ref 0.0–0.2)

## 2019-04-19 LAB — RESPIRATORY PANEL BY RT PCR (FLU A&B, COVID)
Influenza A by PCR: NEGATIVE
Influenza B by PCR: NEGATIVE
SARS Coronavirus 2 by RT PCR: POSITIVE — AB

## 2019-04-19 LAB — COMPREHENSIVE METABOLIC PANEL
ALT: 37 U/L (ref 0–44)
AST: 57 U/L — ABNORMAL HIGH (ref 15–41)
Albumin: 2.6 g/dL — ABNORMAL LOW (ref 3.5–5.0)
Alkaline Phosphatase: 103 U/L (ref 38–126)
Anion gap: 10 (ref 5–15)
BUN: 60 mg/dL — ABNORMAL HIGH (ref 8–23)
CO2: 18 mmol/L — ABNORMAL LOW (ref 22–32)
Calcium: 7.7 mg/dL — ABNORMAL LOW (ref 8.9–10.3)
Chloride: 102 mmol/L (ref 98–111)
Creatinine, Ser: 4.5 mg/dL — ABNORMAL HIGH (ref 0.61–1.24)
GFR calc Af Amer: 15 mL/min — ABNORMAL LOW (ref >60)
GFR calc non Af Amer: 13 mL/min — ABNORMAL LOW (ref >60)
Glucose, Bld: 138 mg/dL — ABNORMAL HIGH (ref 70–99)
Potassium: 4.9 mmol/L (ref 3.5–5.1)
Sodium: 130 mmol/L — ABNORMAL LOW (ref 135–145)
Total Bilirubin: 0.7 mg/dL (ref 0.3–1.2)
Total Protein: 7.1 g/dL (ref 6.5–8.1)

## 2019-04-19 LAB — C-REACTIVE PROTEIN: CRP: 1.1 mg/dL — ABNORMAL HIGH (ref <1.0)

## 2019-04-19 LAB — FIBRIN DERIVATIVES D-DIMER (ARMC ONLY): Fibrin derivatives D-dimer (ARMC): 1126.72 ng/mL (FEU) — ABNORMAL HIGH (ref 0.00–499.00)

## 2019-04-19 LAB — LACTATE DEHYDROGENASE: LDH: 154 U/L (ref 98–192)

## 2019-04-19 LAB — TRIGLYCERIDES: Triglycerides: 194 mg/dL — ABNORMAL HIGH (ref <150)

## 2019-04-19 LAB — FIBRINOGEN: Fibrinogen: 539 mg/dL — ABNORMAL HIGH (ref 210–475)

## 2019-04-19 LAB — BRAIN NATRIURETIC PEPTIDE: B Natriuretic Peptide: 547 pg/mL — ABNORMAL HIGH (ref 0.0–100.0)

## 2019-04-19 LAB — LACTIC ACID, PLASMA
Lactic Acid, Venous: 0.7 mmol/L (ref 0.5–1.9)
Lactic Acid, Venous: 0.6 mmol/L (ref 0.5–1.9)

## 2019-04-19 LAB — FERRITIN: Ferritin: 196 ng/mL (ref 24–336)

## 2019-04-19 LAB — POC SARS CORONAVIRUS 2 AG: SARS Coronavirus 2 Ag: NEGATIVE

## 2019-04-19 MED ORDER — ONDANSETRON HCL 4 MG PO TABS: 4.0000 mg | ORAL_TABLET | Freq: Four times a day (QID) | ORAL | Status: DC | PRN

## 2019-04-19 MED ORDER — LACTULOSE 10 GM/15ML PO SOLN: 10.0000 g | Freq: Two times a day (BID) | ORAL | Status: DC | PRN

## 2019-04-19 MED ORDER — IPRATROPIUM-ALBUTEROL 20-100 MCG/ACT IN AERS
1.0000 | INHALATION_SPRAY | Freq: Four times a day (QID) | RESPIRATORY_TRACT | Status: DC
  Administered 2019-04-20 – 2019-04-22 (×10): 1 via RESPIRATORY_TRACT
  Filled 2019-04-19: qty 4

## 2019-04-19 MED ORDER — HYDROCOD POLST-CPM POLST ER 10-8 MG/5ML PO SUER: 5.0000 mL | Freq: Two times a day (BID) | ORAL | Status: DC | PRN

## 2019-04-19 MED ORDER — ALBUTEROL SULFATE HFA 108 (90 BASE) MCG/ACT IN AERS
1.0000 | INHALATION_SPRAY | RESPIRATORY_TRACT | Status: DC | PRN
  Filled 2019-04-19: qty 6.7

## 2019-04-19 MED ORDER — ONDANSETRON HCL 4 MG/2ML IJ SOLN: 4.0000 mg | Freq: Four times a day (QID) | INTRAMUSCULAR | Status: DC | PRN

## 2019-04-19 MED ORDER — IPRATROPIUM-ALBUTEROL 0.5-2.5 (3) MG/3ML IN SOLN
3.0000 mL | Freq: Once | RESPIRATORY_TRACT | Status: AC
  Administered 2019-04-19: 3 mL via RESPIRATORY_TRACT
  Filled 2019-04-19: qty 3

## 2019-04-19 MED ORDER — ACETAMINOPHEN 325 MG PO TABS: 650.0000 mg | ORAL_TABLET | Freq: Four times a day (QID) | ORAL | Status: DC | PRN

## 2019-04-19 MED ORDER — SODIUM CHLORIDE 0.9 % IV SOLN
500.0000 mg | INTRAVENOUS | Status: DC
  Administered 2019-04-20 – 2019-04-21 (×3): 500 mg via INTRAVENOUS
  Filled 2019-04-19 (×4): qty 500

## 2019-04-19 MED ORDER — METHYLPREDNISOLONE SODIUM SUCC 125 MG IJ SOLR
0.5000 mg/kg | Freq: Two times a day (BID) | INTRAMUSCULAR | Status: DC
  Administered 2019-04-20 – 2019-04-21 (×5): 40.625 mg via INTRAVENOUS
  Filled 2019-04-19 (×5): qty 2

## 2019-04-19 MED ORDER — SODIUM CHLORIDE 0.9 % IV SOLN
2.0000 g | INTRAVENOUS | Status: DC
  Administered 2019-04-19: 2 g via INTRAVENOUS
  Filled 2019-04-19: qty 20

## 2019-04-19 MED ORDER — ZINC SULFATE 220 (50 ZN) MG PO CAPS
220.0000 mg | ORAL_CAPSULE | Freq: Every day | ORAL | Status: DC
  Administered 2019-04-20 – 2019-04-22 (×3): 220 mg via ORAL
  Filled 2019-04-19 (×3): qty 1

## 2019-04-19 MED ORDER — SODIUM CHLORIDE 0.9 % IV SOLN
100.0000 mg | Freq: Every day | INTRAVENOUS | Status: DC
  Administered 2019-04-20 – 2019-04-22 (×3): 100 mg via INTRAVENOUS
  Filled 2019-04-19 (×3): qty 20

## 2019-04-19 MED ORDER — GUAIFENESIN-DM 100-10 MG/5ML PO SYRP
10.0000 mL | ORAL_SOLUTION | ORAL | Status: DC | PRN
  Filled 2019-04-19: qty 10

## 2019-04-19 MED ORDER — MAGNESIUM SULFATE 2 GM/50ML IV SOLN
2.0000 g | Freq: Once | INTRAVENOUS | Status: AC
  Administered 2019-04-19: 2 g via INTRAVENOUS
  Filled 2019-04-19: qty 50

## 2019-04-19 MED ORDER — HEPARIN SODIUM (PORCINE) 5000 UNIT/ML IJ SOLN
5000.0000 [IU] | Freq: Three times a day (TID) | INTRAMUSCULAR | Status: DC
  Administered 2019-04-20 – 2019-04-22 (×8): 5000 [IU] via SUBCUTANEOUS
  Filled 2019-04-19 (×8): qty 1

## 2019-04-19 MED ORDER — ASCORBIC ACID 500 MG PO TABS
500.0000 mg | ORAL_TABLET | Freq: Every day | ORAL | Status: DC
  Administered 2019-04-20 – 2019-04-22 (×3): 500 mg via ORAL
  Filled 2019-04-19 (×3): qty 1

## 2019-04-19 MED ORDER — SODIUM CHLORIDE 0.9 % IV SOLN
200.0000 mg | Freq: Once | INTRAVENOUS | Status: AC
  Administered 2019-04-20: 01:00:00 200 mg via INTRAVENOUS
  Filled 2019-04-19: qty 200

## 2019-04-19 NOTE — ED Notes (Signed)
Spoke w niece Levada Dy [phone] Gave update

## 2019-04-19 NOTE — H&P (Signed)
History and Physical    DEWEL LOTTER KCM:[id number] DOB: [birth year] DOA: 04/19/2019  PCP: [name], MD  Patient coming from: Home  I have personally briefly reviewed patient's old medical records in [place]  Chief Complaint: Dyspnea, productive cough, fatigue  HPI: [name] is a 69 y.o. male with medical history significant for COPD, type 2 diabetes, hypertension, CKD stage IV, cirrhosis, IgA MGUS, and anemia of chronic disease and iron deficiency who presents to the ED for evaluation of dyspnea.  Patient reports about 3 days of progressive shortness of breath and cough productive of green sputum.  He has been feeling generally fatigued with low energy.  He denies any chest pain, subjective fevers, diaphoresis, chills, abdominal pain, or dysuria.  He reports his last bowel movement was 2 days ago.  He has not had any nausea, vomiting.  He was seen by his home health nurse for a wellness check who called EMS.  EMS provided 125 mg Solu-Medrol, albuterol and DuoNeb treatments, and the patient was brought to the ED for further evaluation.  ED Course:  Initial vitals showed BP 186/90, pulse 85, RR 23, temp 100.3 Fahrenheit, SPO2 95% on room air.  Patient's O2 saturation decreased and he was placed on 2 L supplemental O2 via [place] with O2 saturation maintaining in the mid 90s.  Labs are notable for WBC 9.4, hemoglobin 8.9, platelets 183,000, sodium 130, potassium 4.9, bicarb 18, BUN 60, creatinine 4.5, EGFR 15, high-sensitivity troponin I 27, lactic acid 0.7, BNP 547, fibrin derivatives D-dimer 1126.72, ferritin 196, fibrinogen 539.  POC SARS-CoV-2 antigen test is negative.  Respiratory PCR panel is positive for SARS-CoV-2.  Influenza a and B are negative.  Portable chest x-ray shows interval development of bilateral airspace opacities and hyperexpanded lungs.  Patient was given 2 g IV magnesium, 2 DuoNeb treatments, and ordered to receive IV azithromycin and ceftriaxone.  The  hospitalist service was consulted to admit for further evaluation and management.  Review of Systems: All systems reviewed and are negative except as documented in history of present illness above.   Past Medical History:  Diagnosis Date  . Anemia   . Cancer ([place]) 11/02/2014   PERIANAL MASS, RIGHT; INCISIONAL BIOPSY: INVASIVE CARCINOMA WITH BASALOID FEATURES  . CHF (congestive heart failure) ([place])   . Chronic kidney disease   . COPD (chronic obstructive pulmonary disease) ([place])    NO INHALERS  . Diabetes mellitus    NO MEDS  . GERD (gastroesophageal reflux disease)   . Hepatic cirrhosis ([place])   . Hypertension   . Leg swelling   . Loose, teeth    PATIENT STATES "HAS 6 TEETH, SOME ARE LOOSE"  . Neuromuscular disorder ([place])    RIGHT HAND AND FINGERS NUMB  . Shortness of breath dyspnea    WITH EXERTION   . 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 APPENDECTOMY    . COLONOSCOPY WITH PROPOFOL N/A 11/19/2014   Procedure: COLONOSCOPY WITH PROPOFOL;  Surgeon: Lucilla Lame, MD;  Location: [place];  Service: Endoscopy;  Laterality: N/A;  DIABETIC-ORAL MEDS  . COLONOSCOPY WITH PROPOFOL N/A 10/04/2016   Procedure: COLONOSCOPY WITH PROPOFOL;  Surgeon: Jonathon Bellows, MD;  Location: [facility] ENDOSCOPY;  Service: Endoscopy;  Laterality: N/A;  . ESOPHAGOGASTRODUODENOSCOPY (EGD) WITH PROPOFOL N/A 10/04/2016   Procedure: ESOPHAGOGASTRODUODENOSCOPY (EGD) WITH PROPOFOL;  Surgeon: Jonathon Bellows, MD;  Location: [facility] ENDOSCOPY;  Service: Endoscopy;  Laterality:  N/A;  . GIVENS CAPSULE STUDY N/A 11/13/2016   Procedure: GIVENS CAPSULE STUDY;  Surgeon: Jonathon Bellows, MD;  Location: [facility] ENDOSCOPY;  Service: Gastroenterology;  Laterality: N/A;  . INCISION AND DRAINAGE PERIRECTAL ABSCESS Right 12/16/2017   Procedure: EXCISION BASAL CELL CANCER/ GLUTEAL ABSCESS AND FLAP COVERAGE;  Surgeon: Robert Bellow, MD;  Location: [facility] ORS;  Service:  General;  Laterality: Right;  . INGUINAL LYMPH NODE BIOPSY Right 01/26/2016   Procedure: INGUINAL LYMPH NODE BIOPSY;  Surgeon: Clayburn Pert, MD;  Location: [facility] ORS;  Service: General;  Laterality: Right;  . RECTAL BIOPSY N/A 11/02/2014   Procedure: BIOPSY RECTAL;  Surgeon: Marlyce Huge, MD;  Location: [facility] ORS;  Service: General;  Laterality: N/A;  . RECTAL EXAM UNDER ANESTHESIA N/A 11/02/2014   Procedure: RECTAL EXAM UNDER ANESTHESIA;  Surgeon: Marlyce Huge, MD;  Location: [facility] ORS;  Service: General;  Laterality: N/A;  . TONSILLECTOMY      Social History:  reports that he has been smoking cigarettes. He has a 5.00 pack-year smoking history. He has never used smokeless tobacco. He reports previous alcohol use. He reports previous drug use.  No Known Allergies  Family History  Problem Relation Age of Onset  . Asthma Mother   . Colon cancer Neg Hx      Prior to Admission medications   Medication Sig Start Date End Date Taking? Authorizing Provider  albuterol (VENTOLIN HFA) 108 (90 Base) MCG/ACT inhaler Inhale 2 puffs into the lungs every 6 (six) hours as needed for up to 7 days for wheezing or shortness of breath. 04/01/19 04/08/19  Vanessa Lacona, MD  allopurinol (ZYLOPRIM) 100 MG tablet Take 100 mg by mouth daily. 03/20/19   [provider]  amLODipine (NORVASC) 10 MG tablet Take 10 mg by mouth 2 (two) times daily.     [provider]  atorvastatin (LIPITOR) 40 MG tablet Take 40 mg by mouth every morning.     [provider]  betamethasone dipropionate 0.05 % cream Apply topically 2 (two) times daily. 02/24/19   Edrick Kins, DPM  carvedilol (COREG) 3.125 MG tablet Take 3.125 mg by mouth 2 (two) times daily. 03/31/19   [provider]  docusate sodium (COLACE) 100 MG capsule Take 1 capsule (100 mg total) by mouth 2 (two) times daily. 01/24/18   Vaughan Basta, MD  ferrous sulfate 325 (65 FE) MG tablet Take 325 mg by  mouth daily.     [provider]  furosemide (LASIX) 20 MG tablet Take 20 mg by mouth daily.  07/08/18   [provider]  gentamicin cream (GARAMYCIN) 0.1 % Apply 1 application topically 2 (two) times daily. 02/24/19   Edrick Kins, DPM  hydrALAZINE (APRESOLINE) 25 MG tablet Take 25 mg by mouth daily.     [provider]  omeprazole (PRILOSEC) 40 MG capsule Take 40 mg by mouth every morning.     [provider]  polyethylene glycol (MIRALAX / GLYCOLAX) packet Take 17 g by mouth daily as needed for mild constipation.     [provider]  predniSONE (DELTASONE) 20 MG tablet Take 2 tablets (40 mg total) by mouth daily for 5 days. 04/14/19 04/19/19  Vanessa [place], MD  sodium bicarbonate 650 MG tablet Take 1 tablet (650 mg total) by mouth 3 (three) times daily. Patient taking differently: Take 650 mg by mouth 2 (two) times daily.  01/24/18   Vaughan Basta, MD  sulfamethoxazole-trimethoprim (BACTRIM DS) 800-160 MG tablet Take 1 tablet  by mouth 2 (two) times daily. 01/09/19   Edrick Kins, DPM  torsemide (DEMADEX) 20 MG tablet Take 20 mg by mouth daily. 06/03/18   [provider]  vitamin B-12 (CYANOCOBALAMIN) 1000 MCG tablet Take 1 tablet (1,000 mcg total) by mouth daily. 06/03/17   Sindy Guadel[place], MD  Vitamin D, Ergocalciferol, (DRISDOL) 50000 units CAPS capsule Take 50,000 Units by mouth every Tuesday.     [provider]    Physical Exam: Vitals:   04/19/19 1952 04/19/19 1953 04/19/19 1956  BP:   (!) 186/90  Pulse:   79  Resp:   (!) 23  Temp:   100.3 F (37.9 C)  TempSrc:   Oral  SpO2: 95%  95%  Weight:  81 kg   Height:  '5\' 10"'  (1.778 m)     Constitutional: Resting supine in bed, NAD, calm, appears tired but comfortable Eyes: PERRL, lids and conjunctivae normal ENMT: Mucous membranes are moist. Posterior pharynx clear of any exudate or lesions.Normal dentition.  Neck: normal, supple, no masses. Respiratory: Expiratory  wheezing bilaterally.  Normal respiratory effort. No accessory muscle use.  Cardiovascular: Regular rate and rhythm, systolic murmur present. No extremity edema. 2+ pedal pulses. Abdomen: Slightly distended but not tense, no tenderness, no masses palpated. No hepatosplenomegaly. Bowel sounds hypoactive.  Musculoskeletal: no clubbing / cyanosis. No joint deformity upper and lower extremities. Good ROM, no contractures. Normal muscle tone.  Skin: no rashes, lesions, ulcers. No induration Neurologic: CN 2-12 grossly intact. Sensation intact, Strength 5/5 in all 4.  Psychiatric: Normal judgment and insight. Alert and oriented x 3. Normal mood.     Labs on Admission: I have personally reviewed following labs and imaging studies  CBC: Recent Labs  Lab 04/14/19 1123 04/14/19 1359 04/19/19 1957  WBC 5.9 5.6 9.4  NEUTROABS  --   --  6.7  HGB 8.2* 8.6* 8.9*  HCT 26.4* 27.6* 27.1*  MCV 96.0 94.8 91.2  PLT 134* 136* 132   Basic Metabolic Panel: Recent Labs  Lab 04/14/19 1359 04/19/19 1957  NA 132* 130*  K 5.2* 4.9  CL 106 102  CO2 17* 18*  GLUCOSE 107* 138*  BUN 45* 60*  CREATININE 4.38* 4.50*  CALCIUM 8.0* 7.7*   GFR: Estimated Creatinine Clearance: 16.7 mL/min (A) (by C-G formula based on SCr of 4.5 mg/dL (H)). Liver Function Tests: Recent Labs  Lab 04/19/19 1957  AST 57*  ALT 37  ALKPHOS 103  BILITOT 0.7  PROT 7.1  ALBUMIN 2.6*   No results for input(s): LIPASE, AMYLASE in the last 168 hours. No results for input(s): AMMONIA in the last 168 hours. Coagulation Profile: No results for input(s): INR, PROTIME in the last 168 hours. Cardiac Enzymes: No results for input(s): CKTOTAL, CKMB, CKMBINDEX, TROPONINI in the last 168 hours. BNP (last 3 results) No results for input(s): PROBNP in the last 8760 hours. HbA1C: No results for input(s): HGBA1C in the last 72 hours. CBG: No results for input(s): GLUCAP in the last 168 hours. Lipid Profile: Recent Labs     04/19/19 1957  TRIG 194*   Thyroid Function Tests: No results for input(s): TSH, T4TOTAL, [name]4, T3FREE, THYROIDAB in the last 72 hours. Anemia Panel: Recent Labs    04/19/19 1957  FERRITIN 196   Urine analysis:    Component Value Date/Time   COLORURINE YELLOW (A) 01/23/2018 1051   APPEARANCEUR CLEAR (A) 01/23/2018 1051   APPEARANCEUR Clear 09/08/2013 0158   LABSPEC 1.013 01/23/2018 1051   LABSPEC  1.027 09/08/2013 0158   PHURINE 5.0 01/23/2018 1051   GLUCOSEU 50 (A) 01/23/2018 1051   GLUCOSEU >=500 09/08/2013 0158   HGBUR NEGATIVE 01/23/2018 1051   BILIRUBINUR NEGATIVE 01/23/2018 1051   BILIRUBINUR Negative 09/08/2013 0158   KETONESUR NEGATIVE 01/23/2018 1051   PROTEINUR 100 (A) 01/23/2018 1051   UROBILINOGEN 2.0 (H) 07/03/2011 1333   NITRITE NEGATIVE 01/23/2018 1051   LEUKOCYTESUR NEGATIVE 01/23/2018 1051   LEUKOCYTESUR 2+ 09/08/2013 0158    Radiological Exams on Admission: DG Chest Portable 1 View  Result Date: 04/19/2019 CLINICAL DATA:  Shortness of breath. EXAM: PORTABLE CHEST 1 VIEW COMPARISON:  April 14, 2019 FINDINGS: There is been interval development of bilateral airspace opacities primarily in the mid lung zones bilaterally. The heart size remains enlarged. There is persistent blunting of the costophrenic angles. There is no acute osseous abnormality. Aortic calcifications are noted. The lungs appear to be somewhat hyperexpanded. IMPRESSION: 1. Interval development of bilateral airspace opacities concerning for developing multifocal pneumonia. Pulmonary edema can have a similar appearance in the appropriate clinical setting. 2. Hyperexpanded lungs, similar to prior study. 3. Persistent blunting of the costophrenic angles which may be secondary to small bilateral pleural effusions and or atelectasis. Electronically Signed   By: Constance Holster M.D.   On: 04/19/2019 20:06    EKG: Independently reviewed. Sinus rhythm with PACs, LAD, early repolarization changes  inferior leads.  Not significantly changed from prior.  Assessment/Plan Principal Problem:   Acute hypoxemic respiratory failure due to COVID-19 [facility]) Active Problems:   Hypertension associated with diabetes ([place])   Cirrhosis (HCC)   Anemia in chronic kidney disease   Chronic kidney disease, stage 4 (severe) (HCC)   Type 2 diabetes mellitus (HCC)   COPD with acute exacerbation (HCC)  [name] is a 69 y.o. male with medical history significant for COPD, type 2 diabetes, hypertension, CKD stage IV, cirrhosis, and anemia who is admitted with acute hypoxemic respiratory failure due to COVID-19 viral infection.  Acute respiratory failure with hypoxia due to COVID-19 viral pneumonia: SARS-CoV-2 PCR positive 04/19/2019.  Chest x-ray with bilateral airspace opacities.  Currently requiring 2 L supplemental O2 via [place]. -Start IV remdesivir per pharmacy protocol -Start IV Solu-Medrol 0.5 mg/kg twice daily -Continue supplemental oxygen as needed -Continue Combivent, flutter valve, incentive spirometer -Antitussives, vitamin C, zinc -Will discontinue ceftriaxone  COPD with acute exacerbation: Likely exacerbated by acute viral pneumonia.  Has continued wheezing on admission. -Continue IV Solu-Medrol, Combivent, albuterol as needed, supplemental oxygen -Continue azithromycin  CKD stage IV: Appears relatively stable.  Continue to monitor.  Type 2 diabetes: Not currently on medical management.  Check A1c, add SSI if needed.  Hypertension: Currently stable.  Continue amlodipine 10 mg daily.  Hepatic cirrhosis: Appears to be compensated, currently without encephalopathy.  However he says he has not had a bowel movement in 3 days.  Will start lactulose 10 mg twice daily, titrate to 2-3 bowel movements per day.  Medication reconciliation pending, unclear diuretic regimen.  Restart when able.  Anemia secondary to chronic kidney disease and iron deficiency: Chronic and stable without obvious  bleeding.  Continue to monitor.   DVT prophylaxis: Subcutaneous heparin Code Status: Full code, confirmed with patient Family Communication: Discussed with patient, he has discussed with family Disposition Plan: Pending clinical progress Consults called: None Admission status: Inpatient due to acute hypoxemic respiratory failure due to COVID-19 viral pneumonia with associated COPD exacerbation.   Zada Finders MD [facility]  If 7PM-7AM, please contact night-coverage  [email or web address]  04/20/2019, 12:11 AM

## 2019-04-19 NOTE — ED Notes (Signed)
Pt sleeping at his time in room. Steady rise and fall of chest noted at this time and pt in NAD.

## 2019-04-19 NOTE — ED Provider Notes (Signed)
[facility] Emergency Department Provider Note ____________________________________________   First MD Initiated Contact with Patient 04/19/19 1951     (approximate)  I have reviewed the triage vital signs and the nursing notes.   HISTORY  Chief Complaint Shortness of Breath    HPI [name] is a 69 y.o. male with PMH as noted below including COPD who presents with worsening shortness of breath over approximately the last 3 days, gradual onset, and associated with productive cough.  The patient denies chest pain or fever.  He received duo nebs and Solu-Medrol by EMS.   Past Medical History:  Diagnosis Date  . Anemia   . Cancer ([place]) 11/02/2014   PERIANAL MASS, RIGHT; INCISIONAL BIOPSY: INVASIVE CARCINOMA WITH BASALOID FEATURES  . CHF (congestive heart failure) ([place])   . Chronic kidney disease   . COPD (chronic obstructive pulmonary disease) ([place])    NO INHALERS  . Diabetes mellitus    NO MEDS  . GERD (gastroesophageal reflux disease)   . Hepatic cirrhosis ([place])   . Hypertension   . Leg swelling   . Loose, teeth    PATIENT STATES "HAS 6 TEETH, SOME ARE LOOSE"  . Neuromuscular disorder ([place])    RIGHT HAND AND FINGERS NUMB  . Shortness of breath dyspnea    WITH EXERTION   . 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 Acute hypoxemic respiratory failure due to COVID-19 ([place]) 04/19/2019  . Type 2 diabetes mellitus ([place]) 04/19/2019  . COPD with acute exacerbation ([place]) 04/19/2019  . Chronic kidney disease, stage 4 (severe) ([place]) 01/29/2019  . Hyperparathyroidism due to renal insufficiency ([place]) 01/29/2019  . Proteinuria 01/29/2019  . Sebaceous cyst 09/24/2018  . MGUS (monoclonal gammopathy of unknown significance) 03/27/2018  . Anemia in chronic kidney disease 03/27/2018  . Rotator cuff arthropathy of right shoulder 02/05/2018  . ARF (acute renal failure) ([place]) 01/21/2018    . Mass of perianal area 10/16/2017  . Mass of pharynx 11/09/2016  . Polyp of cecum 11/09/2016  . Chronic hepatitis C without hepatic coma ([place]) 11/09/2016  . Cirrhosis ([place]) 11/09/2016  . Iron deficiency anemia 09/19/2016  . Chest pain with high risk for cardiac etiology 08/13/2016  . SOB (shortness of breath) on exertion 08/13/2016  . Varicose veins of both lower extremities with inflammation 06/11/2016  . Hypertension associated with diabetes ([place]) 06/11/2016  . Chronic venous insufficiency 05/08/2016  . Diabetic foot ulcer with osteomyelitis ([place]) 05/08/2016  . Lymphedema 04/12/2016  . Ulcer of great toe ([place]) 04/12/2016  . 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 Anemia 01/26/2016  . Skin mass   . Lymphadenopathy, inguinal   . Symptomatic anemia 01/24/2016  . Hypoglycemia 09/13/2015  . Internal hemorrhoids 12/30/2014  . Mass of anus 11/01/2014    Past Surgical History:  Procedure Laterality Date  . APPENDECTOMY    . COLONOSCOPY WITH PROPOFOL N/A 11/19/2014   Procedure: COLONOSCOPY WITH PROPOFOL;  Surgeon: Lucilla Lame, MD;  Location: [facility];  Service: Endoscopy;  Laterality: N/A;  DIABETIC-ORAL MEDS  . COLONOSCOPY WITH PROPOFOL N/A 10/04/2016   Procedure: COLONOSCOPY WITH PROPOFOL;  Surgeon: Jonathon Bellows, MD;  Location: [facility] ENDOSCOPY;  Service: Endoscopy;  Laterality: N/A;  . ESOPHAGOGASTRODUODENOSCOPY (EGD) WITH PROPOFOL N/A 10/04/2016   Procedure: ESOPHAGOGASTRODUODENOSCOPY (EGD) WITH PROPOFOL;  Surgeon: Jonathon Bellows, MD;  Location: [facility] ENDOSCOPY;  Service: Endoscopy;  Laterality: N/A;  . GIVENS  CAPSULE STUDY N/A 11/13/2016   Procedure: GIVENS CAPSULE STUDY;  Surgeon: Jonathon Bellows, MD;  Location: [facility] ENDOSCOPY;  Service: Gastroenterology;  Laterality: N/A;  . INCISION AND DRAINAGE PERIRECTAL ABSCESS Right 12/16/2017   Procedure: EXCISION BASAL CELL CANCER/ GLUTEAL ABSCESS AND FLAP COVERAGE;  Surgeon: Robert Bellow, MD;  Location: [facility] ORS;  Service:  General;  Laterality: Right;  . INGUINAL LYMPH NODE BIOPSY Right 01/26/2016   Procedure: INGUINAL LYMPH NODE BIOPSY;  Surgeon: Clayburn Pert, MD;  Location: [facility] ORS;  Service: General;  Laterality: Right;  . RECTAL BIOPSY N/A 11/02/2014   Procedure: BIOPSY RECTAL;  Surgeon: Marlyce Huge, MD;  Location: [facility] ORS;  Service: General;  Laterality: N/A;  . RECTAL EXAM UNDER ANESTHESIA N/A 11/02/2014   Procedure: RECTAL EXAM UNDER ANESTHESIA;  Surgeon: Marlyce Huge, MD;  Location: [facility] ORS;  Service: General;  Laterality: N/A;  . TONSILLECTOMY      Prior to Admission medications   Medication Sig Start Date End Date Taking? Authorizing Provider  albuterol (VENTOLIN HFA) 108 (90 Base) MCG/ACT inhaler Inhale 2 puffs into the lungs every 6 (six) hours as needed for wheezing or shortness of breath.    [provider]  allopurinol (ZYLOPRIM) 100 MG tablet Take 100 mg by mouth daily. 03/20/19   [provider]  amLODipine (NORVASC) 10 MG tablet Take 10 mg by mouth 2 (two) times daily.     [provider]  atorvastatin (LIPITOR) 40 MG tablet Take 40 mg by mouth every morning.     [provider]  betamethasone dipropionate 0.05 % cream Apply topically 2 (two) times daily. 02/24/19   Edrick Kins, DPM  carvedilol (COREG) 3.125 MG tablet Take 3.125 mg by mouth 2 (two) times daily. 03/31/19   [provider]  docusate sodium (COLACE) 100 MG capsule Take 1 capsule (100 mg total) by mouth 2 (two) times daily. 01/24/18   Vaughan Basta, MD  DULERA 200-5 MCG/ACT AERO Inhale 1 puff into the lungs 2 (two) times daily. 04/09/19   [provider]  ferrous sulfate 325 (65 FE) MG tablet Take 325 mg by mouth daily.     [provider]  furosemide (LASIX) 20 MG tablet Take 20 mg by mouth daily.  07/08/18   [provider]  gentamicin cream (GARAMYCIN) 0.1 % Apply 1 application topically 2 (two) times daily. 02/24/19   Edrick Kins, DPM  hydrALAZINE (APRESOLINE) 25 MG tablet Take 25 mg by mouth daily.     [provider]  omeprazole (PRILOSEC) 40 MG capsule Take 40 mg by mouth every morning.     [provider]  polyethylene glycol (MIRALAX / GLYCOLAX) packet Take 17 g by mouth daily as needed for mild constipation.     [provider]  predniSONE (DELTASONE) 20 MG tablet Take 2 tablets (40 mg total) by mouth daily for 5 days. 04/14/19 04/19/19  Vanessa Rochelle, MD  sodium bicarbonate 650 MG tablet Take 1 tablet (650 mg total) by mouth 3 (three) times daily. Patient taking differently: Take 650 mg by mouth 2 (two) times daily.  01/24/18   Vaughan Basta, MD  SPIRIVA RESPIMAT 1.25 MCG/ACT AERS Inhale 1 spray into the lungs daily. 04/09/19   [provider]  torsemide (DEMADEX) 20 MG tablet Take 20 mg by mouth daily. 06/03/18   [provider]  vitamin B-12 (CYANOCOBALAMIN) 1000 MCG tablet Take 1 tablet (1,000 mcg total) by mouth daily. 06/03/17   Sindy Guadeloupe, MD  Vitamin D, Ergocalciferol, (DRISDOL) 50000 units CAPS capsule Take 50,000 Units by mouth every Tuesday.     [provider]    Allergies Patient has no known allergies.  Family History  Problem Relation Age of Onset  . Asthma Mother   . [name] cancer Neg Hx     Social History Social History   Tobacco Use  . Smoking status: Current Every Day Smoker    Packs/day: 0.25    Years: 20.00    Pack years: 5.00    Types: Cigarettes  . Smokeless tobacco: Never Used  . Tobacco comment: 3-4 cigarettes per day  Substance Use Topics  . Alcohol use: Not Currently  . Drug use: Not Currently    Comment: Last Use 2016 per patient-+ UDS FOR COCAINE IN 2014    Review of Systems  Constitutional: No fever. Eyes: No redness. ENT: No sore throat. Cardiovascular: Denies chest pain. Respiratory: Positive for shortness of breath. Gastrointestinal: No vomiting. Genitourinary: Negative for flank  pain. Musculoskeletal: Negative for back pain. Skin: Negative for rash. Neurological: Negative for headache.   ____________________________________________   PHYSICAL EXAM:  VITAL SIGNS: ED Triage Vitals  Enc Vitals Group     BP 04/19/19 1956 (!) 186/90     Pulse Rate 04/19/19 1956 79     Resp 04/19/19 1956 (!) 23     Temp 04/19/19 1956 100.3 F (37.9 C)     Temp Source 04/19/19 1956 Oral     SpO2 04/19/19 1952 95 %     Weight 04/19/19 1953 178 lb 9.2 oz (81 kg)     Height 04/19/19 1953 5\' 10"  (1.778 m)     Head Circumference --      Peak Flow --      Pain Score 04/19/19 1953 0     Pain Loc --      Pain Edu? --      Excl. in [place]? --     Constitutional: Alert and oriented.  Uncomfortable appearing but in no acute distress. Eyes: Conjunctivae are normal.  Head: Atraumatic. Nose: No congestion/rhinnorhea. Mouth/Throat: Mucous membranes are moist.   Neck: Normal range of motion.  Cardiovascular: Normal rate, regular rhythm. Grossly normal heart sounds.  Good peripheral circulation. Respiratory: Increased respiratory effort.  Diffuse wheezing bilaterally. Gastrointestinal: Soft and nontender. No distention.  Genitourinary: No flank tenderness. Musculoskeletal: No lower extremity edema.  Extremities warm and well perfused.  Neurologic: Motor intact in all extremities. Skin:  Skin is warm and dry. No rash noted. Psychiatric: Calm and cooperative.  ____________________________________________   LABS (all labs ordered are listed, but only abnormal results are displayed)  Labs Reviewed  RESPIRATORY PANEL BY RT PCR (FLU A&B, COVID) - Abnormal; Notable for the following components:      Result Value   SARS Coronavirus 2 by RT PCR POSITIVE (*)    All other components within normal limits  COMPREHENSIVE METABOLIC PANEL - Abnormal; Notable for the following components:   Sodium 130 (*)    CO2 18 (*)    Glucose, Bld 138 (*)    BUN 60 (*)    Creatinine, Ser 4.50 (*)     Calcium 7.7 (*)    Albumin 2.6 (*)    AST 57 (*)    GFR calc non Af Amer 13 (*)    GFR calc Af Amer 15 (*)    All other components within normal limits  CBC WITH DIFFERENTIAL/PLATELET - Abnormal; Notable for the following components:   RBC 2.97 (*)  Hemoglobin 8.9 (*)    HCT 27.1 (*)    Monocytes Absolute 1.7 (*)    Abs Immature Granulocytes 0.10 (*)    All other components within normal limits  BRAIN NATRIURETIC PEPTIDE - Abnormal; Notable for the following components:   B Natriuretic Peptide 547.0 (*)    All other components within normal limits  FIBRIN DERIVATIVES D-DIMER ([facility] ONLY) - Abnormal; Notable for the following components:   Fibrin derivatives D-dimer ([facility]) 1,126.72 (*)    All other components within normal limits  FIBRINOGEN - Abnormal; Notable for the following components:   Fibrinogen 539 (*)    All other components within normal limits  TRIGLYCERIDES - Abnormal; Notable for the following components:   Triglycerides 194 (*)    All other components within normal limits  TROPONIN I (HIGH SENSITIVITY) - Abnormal; Notable for the following components:   Troponin I (High Sensitivity) 27 (*)    All other components within normal limits  LACTIC ACID, PLASMA  FERRITIN  LACTIC ACID, PLASMA  C-REACTIVE PROTEIN  LACTATE DEHYDROGENASE  PROCALCITONIN  HIV ANTIBODY (ROUTINE TESTING W REFLEX)  CBC WITH DIFFERENTIAL/PLATELET  COMPREHENSIVE METABOLIC PANEL  FIBRIN DERIVATIVES D-DIMER ([facility] ONLY)  C-REACTIVE PROTEIN  FERRITIN  MAGNESIUM  PHOSPHORUS  HEMOGLOBIN A1C  POC SARS CORONAVIRUS 2 AG -  ED  POC SARS CORONAVIRUS 2 AG  ABO/RH  TROPONIN I (HIGH SENSITIVITY)   ____________________________________________  EKG  ED ECG REPORT I, Arta Silence, the attending physician, personally viewed and interpreted this ECG.  Date: 04/19/2019 EKG Time: 1955 Rate: 81 Rhythm: normal sinus rhythm QRS Axis: Left axis Intervals: normal ST/T Wave abnormalities:  Nonspecific inferior ST abnormalities Narrative Interpretation: no evidence of acute ischemia  ____________________________________________  RADIOLOGY  CXR: Bilateral airspace opacities concerning for multifocal pneumonia  ____________________________________________   PROCEDURES  Procedure(s) performed: No  Procedures  Critical Care performed: Yes  CRITICAL CARE Performed by: Arta Silence   Total critical care time: 35 minutes  Critical care time was exclusive of separately billable procedures and treating other patients.  Critical care was necessary to treat or prevent imminent or life-threatening deterioration.  Critical care was time spent personally by me on the following activities: development of treatment plan with patient and/or surrogate as well as nursing, discussions with consultants, evaluation of patient's response to treatment, examination of patient, obtaining history from patient or surrogate, ordering and performing treatments and interventions, ordering and review of laboratory studies, ordering and review of radiographic studies, pulse oximetry and re-evaluation of patient's condition. ____________________________________________   INITIAL IMPRESSION / ASSESSMENT AND PLAN / ED COURSE  Pertinent labs & imaging results that were available during my care of the patient were reviewed by me and considered in my medical decision making (see chart for details).  69 year old male with PMH as noted above including a history of COPD and CHF presents with gradual onset worsening shortness of breath over the last 3 days associated with a productive cough.  I reviewed the past medical records in epic.  The patient was seen in the ED on 1/5 for elevated blood pressure and worsening shortness of breath.  He was discharged from the ED after improving.  Previously he was seen in the ED on 04/01/19 for shortness of breath and chest pain and found to be anemic  requiring transfusion.  On exam today, the patient has increased work of breathing but no acute respiratory distress.  He has a low-grade fever, and is hypertensive and tachypneic.  O2 saturation is in  the mid 90s on room air.  He has diffuse wheezing bilaterally.  The remainder of the exam is as described above.  Differential includes COPD exacerbation, bacterial pneumonia, COVID-19, other viral etiology, or less likely CHF.  We will obtain chest x-ray, lab work-up give additional bronchodilators, magnesium, and reassess.  ----------------------------------------- 11:27 PM on 04/19/2019 -----------------------------------------  Chest x-ray shows bilateral infiltrates consistent with multifocal pneumonia.  The initial COVID-19 antigen test was negative, however the PCR is positive.  I started empiric antibiotics.  The patient's respiratory status has improved and the O2 saturation is still in the mid 90s on 2 L by nasal cannula.  However, given the severity of the patient's symptoms, he will require admission for further management.  I discussed his case with the hospitalist.  ____________________________________________   FINAL CLINICAL IMPRESSION(S) / ED DIAGNOSES  Final diagnoses:  Community acquired pneumonia, unspecified laterality  COVID-19      NEW MEDICATIONS STARTED DURING THIS VISIT:  New Prescriptions   No medications on file     Note:  This document was prepared using Dragon voice recognition software and may include unintentional dictation errors.    Arta Silence, MD 04/19/19 2328

## 2019-04-19 NOTE — ED Triage Notes (Signed)
Pt arrives from home via [facility]. Home health nurse called PD for wellness check per EMS.   Pt has hx of COPD and hypertension.   Pt received 125 solumedrol, 1 albuterol, 1 duoneb  20G L AC  BP 186/70 BS 131 T 99.9 oral

## 2019-04-20 LAB — CBC WITH DIFFERENTIAL/PLATELET
Abs Immature Granulocytes: 0.04 10*3/uL (ref 0.00–0.07)
Basophils Absolute: 0 10*3/uL (ref 0.0–0.1)
Basophils Relative: 0 %
Eosinophils Absolute: 0 10*3/uL (ref 0.0–0.5)
Eosinophils Relative: 0 %
HCT: 25.4 % — ABNORMAL LOW (ref 39.0–52.0)
Hemoglobin: 8.3 g/dL — ABNORMAL LOW (ref 13.0–17.0)
Immature Granulocytes: 1 %
Lymphocytes Relative: 5 %
Lymphs Abs: 0.2 10*3/uL — ABNORMAL LOW (ref 0.7–4.0)
MCH: 29.7 pg (ref 26.0–34.0)
MCHC: 32.7 g/dL (ref 30.0–36.0)
MCV: 91 fL (ref 80.0–100.0)
Monocytes Absolute: 0 10*3/uL — ABNORMAL LOW (ref 0.1–1.0)
Monocytes Relative: 1 %
Neutro Abs: 3.8 10*3/uL (ref 1.7–7.7)
Neutrophils Relative %: 93 %
Platelets: 148 10*3/uL — ABNORMAL LOW (ref 150–400)
RBC: 2.79 MIL/uL — ABNORMAL LOW (ref 4.22–5.81)
RDW: 13 % (ref 11.5–15.5)
WBC: 4 10*3/uL (ref 4.0–10.5)
nRBC: 0 % (ref 0.0–0.2)

## 2019-04-20 LAB — COMPREHENSIVE METABOLIC PANEL
ALT: 33 U/L (ref 0–44)
AST: 44 U/L — ABNORMAL HIGH (ref 15–41)
Albumin: 2.2 g/dL — ABNORMAL LOW (ref 3.5–5.0)
Alkaline Phosphatase: 93 U/L (ref 38–126)
Anion gap: 10 (ref 5–15)
BUN: 66 mg/dL — ABNORMAL HIGH (ref 8–23)
CO2: 16 mmol/L — ABNORMAL LOW (ref 22–32)
Calcium: 7.5 mg/dL — ABNORMAL LOW (ref 8.9–10.3)
Chloride: 104 mmol/L (ref 98–111)
Creatinine, Ser: 4.61 mg/dL — ABNORMAL HIGH (ref 0.61–1.24)
GFR calc Af Amer: 14 mL/min — ABNORMAL LOW (ref >60)
GFR calc non Af Amer: 12 mL/min — ABNORMAL LOW (ref >60)
Glucose, Bld: 210 mg/dL — ABNORMAL HIGH (ref 70–99)
Potassium: 4.9 mmol/L (ref 3.5–5.1)
Sodium: 130 mmol/L — ABNORMAL LOW (ref 135–145)
Total Bilirubin: 0.6 mg/dL (ref 0.3–1.2)
Total Protein: 6.4 g/dL — ABNORMAL LOW (ref 6.5–8.1)

## 2019-04-20 LAB — C-REACTIVE PROTEIN: CRP: 0.9 mg/dL (ref <1.0)

## 2019-04-20 LAB — HIV ANTIBODY (ROUTINE TESTING W REFLEX): HIV Screen 4th Generation wRfx: NONREACTIVE

## 2019-04-20 LAB — FERRITIN: Ferritin: 217 ng/mL (ref 24–336)

## 2019-04-20 LAB — HEMOGLOBIN A1C
Hgb A1c MFr Bld: 5.1 % (ref 4.8–5.6)
Mean Plasma Glucose: 99.67 mg/dL

## 2019-04-20 LAB — PROCALCITONIN: Procalcitonin: 0.51 ng/mL

## 2019-04-20 LAB — PHOSPHORUS: Phosphorus: 5.7 mg/dL — ABNORMAL HIGH (ref 2.5–4.6)

## 2019-04-20 LAB — MAGNESIUM: Magnesium: 2.2 mg/dL (ref 1.7–2.4)

## 2019-04-20 LAB — FIBRIN DERIVATIVES D-DIMER (ARMC ONLY): Fibrin derivatives D-dimer (ARMC): 1007.62 ng/mL (FEU) — ABNORMAL HIGH (ref 0.00–499.00)

## 2019-04-20 LAB — ABO/RH: ABO/RH(D): A POS

## 2019-04-20 MED ORDER — AMLODIPINE BESYLATE 10 MG PO TABS
10.0000 mg | ORAL_TABLET | Freq: Every day | ORAL | Status: DC
  Administered 2019-04-20 – 2019-04-22 (×3): 10 mg via ORAL
  Filled 2019-04-20: qty 2
  Filled 2019-04-20 (×2): qty 1

## 2019-04-20 MED ORDER — LACTULOSE 10 GM/15ML PO SOLN
10.0000 g | Freq: Two times a day (BID) | ORAL | Status: DC
  Administered 2019-04-20 – 2019-04-22 (×5): 10 g via ORAL
  Filled 2019-04-20 (×5): qty 30

## 2019-04-20 MED ORDER — SODIUM CHLORIDE 0.9 % IV SOLN
INTRAVENOUS | Status: DC | PRN
  Administered 2019-04-20: 23:00:00 250 mL via INTRAVENOUS

## 2019-04-20 NOTE — Progress Notes (Signed)
PROGRESS NOTE    [name]  ZOX:[id number] DOB: May 12, 1950 DOA: 04/19/2019 PCP: [name], MD    Brief Narrative:  [name] is a 69 y.o. male with medical history significant for COPD, type 2 diabetes, hypertension, CKD stage IV, cirrhosis, IgA MGUS, and anemia of chronic disease and iron deficiency who presents to the ED for evaluation of dyspnea. Respiratory PCR panel is positive for SARS-CoV-2.  Influenza a and B are negative.   Consultants:   none  Procedures: none  Antimicrobials:   remdesivir   Subjective: No new complaints. Some sob. No cp  Objective: Vitals:   04/20/19 1200 04/20/19 1245 04/20/19 1416 04/20/19 1618  BP: (!) 146/88  (!) 147/75 140/72  Pulse: (!) 58 (!) 58 (!) 57 61  Resp: _0 Temp:   98.6 F (37 C)   TempSrc:   Oral   SpO2: 98% 99% 99% 100%  Weight:      Height:        Intake/Output Summary (Last 24 hours) at 04/20/2019 1859 Last data filed at 04/20/2019 0926 Gross per 24 hour  Intake 200 ml  Output 400 ml  Net -200 ml   Filed Weights   04/19/19 1953  Weight: 81 kg    Examination:  General exam: Appears calm and comfortable  Respiratory system: Decreased breath sounds at bases, minimal wheezing no rails Cardiovascular system: S1 & S2 heard, RRR. No JVD, murmurs, rubs, gallops or clicks.  Gastrointestinal system: Abdomen is nondistended, soft and nontender. No organomegaly or masses felt. Normal bowel sounds heard. Central nervous system: Awake and alert no focal neurological deficits. Extremities: No edema Skin: Warm dry Psychiatry: Judgement and insight appear normal. Mood & affect appropriate.     Data Reviewed: I have personally reviewed following labs and imaging studies  CBC: Recent Labs  Lab 04/14/19 1123 04/14/19 1359 04/19/19 1957 04/20/19 0300  WBC 5.9 5.6 9.4 4.0  NEUTROABS  --   --  6.7 3.8  HGB 8.2* 8.6* 8.9* 8.3*  HCT 26.4* 27.6* 27.1* 25.4*  MCV 96.0 94.8 91.2 91.0  PLT 134* 136*  183 811*   Basic Metabolic Panel: Recent Labs  Lab 04/14/19 1359 04/19/19 1957 04/20/19 0300  NA 132* 130* 130*  K 5.2* 4.9 4.9  CL 106 102 104  CO2 17* 18* 16*  GLUCOSE 107* 138* 210*  BUN 45* 60* 66*  CREATININE 4.38* 4.50* 4.61*  CALCIUM 8.0* 7.7* 7.5*  MG  --   --  2.2  PHOS  --   --  5.7*   GFR: Estimated Creatinine Clearance: 16.3 mL/min (A) (by C-G formula based on SCr of 4.61 mg/dL (H)). Liver Function Tests: Recent Labs  Lab 04/19/19 1957 04/20/19 0300  AST 57* 44*  ALT 37 33  ALKPHOS 103 93  BILITOT 0.7 0.6  PROT 7.1 6.4*  ALBUMIN 2.6* 2.2*   No results for input(s): LIPASE, AMYLASE in the last 168 hours. No results for input(s): AMMONIA in the last 168 hours. Coagulation Profile: No results for input(s): INR, PROTIME in the last 168 hours. Cardiac Enzymes: No results for input(s): CKTOTAL, CKMB, CKMBINDEX, TROPONINI in the last 168 hours. BNP (last 3 results) No results for input(s): PROBNP in the last 8760 hours. HbA1C: Recent Labs    04/20/19 0300  HGBA1C 5.1   CBG: No results for input(s): GLUCAP in the last 168 hours. Lipid Profile: Recent Labs    04/19/19 1957  TRIG 194*   Thyroid  Function Tests: No results for input(s): TSH, T4TOTAL, [name]4, T3FREE, THYROIDAB in the last 72 hours. Anemia Panel: Recent Labs    04/19/19 1957 04/20/19 0300  FERRITIN 196 217   Sepsis Labs: Recent Labs  Lab 04/19/19 1957 04/19/19 2245  PROCALCITON  --  0.51  LATICACIDVEN 0.7 0.6    Recent Results (from the past 240 hour(s))  Respiratory Panel by RT PCR (Flu A&B, Covid) - Nasopharyngeal Swab     Status: Abnormal   Collection Time: 04/19/19  8:02 PM   Specimen: Nasopharyngeal Swab  Result Value Ref Range Status   SARS Coronavirus 2 by RT PCR POSITIVE (A) NEGATIVE Final    Comment: RESULT CALLED TO, READ BACK BY AND VERIFIED WITH: MAC BROWN 04/19/19 AT 2227 HS    Influenza A by PCR NEGATIVE NEGATIVE Final   Influenza B by PCR NEGATIVE NEGATIVE  Final    Comment: (NOTE) The Xpert Xpress SARS-CoV-2/FLU/RSV assay is intended as an aid in  the diagnosis of influenza from Nasopharyngeal swab specimens and  should not be used as a sole basis for treatment. Nasal washings and  aspirates are unacceptable for Xpert Xpress SARS-CoV-2/FLU/RSV  testing. Fact Sheet for Patients: PinkCheek.be Fact Sheet for Healthcare Providers: GravelBags.it This test is not yet approved or cleared by the [place] FDA and  has been authorized for detection and/or diagnosis of SARS-CoV-2 by  FDA under an Emergency Use Authorization (EUA). This EUA will remain  in effect (meaning this test can be used) for the duration of the  Covid-19 declaration under Section 564(b)(1) of the Act, 21  U.S.C. section 360bbb-3(b)(1), unless the authorization is  terminated or revoked. Performed at [facility], [address], [place], [place] [address]          Radiology Studies: DG Chest Portable 1 View  Result Date: 04/19/2019 CLINICAL DATA:  Shortness of breath. EXAM: PORTABLE CHEST 1 VIEW COMPARISON:  April 14, 2019 FINDINGS: There is been interval development of bilateral airspace opacities primarily in the mid lung zones bilaterally. The heart size remains enlarged. There is persistent blunting of the costophrenic angles. There is no acute osseous abnormality. Aortic calcifications are noted. The lungs appear to be somewhat hyperexpanded. IMPRESSION: 1. Interval development of bilateral airspace opacities concerning for developing multifocal pneumonia. Pulmonary edema can have a similar appearance in the appropriate clinical setting. 2. Hyperexpanded lungs, similar to prior study. 3. Persistent blunting of the costophrenic angles which may be secondary to small bilateral pleural effusions and or atelectasis. Electronically Signed   By: Constance Holster M.D.   On: 04/19/2019 20:06         Scheduled Meds: . amLODipine  10 mg Oral Daily  . vitamin C  500 mg Oral Daily  . heparin  5,000 Units Subcutaneous Q8H  . Ipratropium-Albuterol  1 puff Inhalation Q6H  . lactulose  10 g Oral BID  . methylPREDNISolone (SOLU-MEDROL) injection  0.5 mg/kg Intravenous Q12H  . zinc sulfate  220 mg Oral Daily   Continuous Infusions: . azithromycin Stopped (04/20/19 0112)  . remdesivir 100 mg in NS 100 mL Stopped (04/20/19 0924)    Assessment & Plan:   Principal Problem:   Acute hypoxemic respiratory failure due to COVID-19 [facility]) Active Problems:   Hypertension associated with diabetes ([place])   Cirrhosis (HCC)   Anemia in chronic kidney disease   Chronic kidney disease, stage 4 (severe) (HCC)   Type 2 diabetes mellitus (HCC)   COPD with acute exacerbation ([place])  Acute respiratory failure with hypoxia due to COVID-19 viral pneumonia: SARS-CoV-2 PCR positive 04/19/2019.  Chest x-ray with bilateral airspace opacities.  Currently requiring 2 L supplemental O2 via [place]. -Start IV remdesivir per pharmacy protocol -Start IV Solu-Medrol 0.5 mg/kg twice daily -Continue supplemental oxygen as needed -Continue Combivent, flutter valve, incentive spirometer -Antitussives, vitamin C, zinc   COPD with acute exacerbation: Likely exacerbated by acute viral pneumonia.  Has continued wheezing on admission. -Continue IV Solu-Medrol, Combivent, albuterol as needed, supplemental oxygen -Continue azithromycin  CKD stage IV: Appears relatively stable.  Continue to monitor.  Type 2 diabetes: Not currently on medical management.  Check A1c, add SSI if needed.  Hypertension: Currently stable.  Continue amlodipine 10 mg daily.  Hepatic cirrhosis: Appears to be compensated, currently without encephalopathy.  However he says he has not had a bowel movement in 3 days.   Started on lactulose 10 mg twice daily, titrate to 2-3 bowel movements per day.  Medication reconciliation pending,  unclear diuretic regimen.  Restart when able.  Anemia secondary to chronic kidney disease and iron deficiency: Chronic and stable without obvious bleeding.  Continue to monitor.   DVT prophylaxis: Subcutaneous heparin Code Status: Full code, confirmed with patient Family Communication: Discussed with patient, he has discussed with family Disposition Plan:  Still not at baseline and requiring IV steroids.  Can DC when medically more stable.     LOS: 1 day   Time spent: 45 minutes with more than 50% on Linnell Camp, MD [facility] Pager [phone]  If 7PM-7AM, please contact night-coverage [email or web address] Password [facility] 04/20/2019, 6:59 PM prog

## 2019-04-20 NOTE — ED Notes (Signed)
This RN attempted to call report. Charge RN informed this RN to call back in 15 so that bed could be assigned for patient.

## 2019-04-20 NOTE — ED Notes (Signed)
Pt asleep, easily aroused. No concerns at this time. Call bell in reach. Will continue to assess.

## 2019-04-20 NOTE — Plan of Care (Signed)
Patient admitted to room, A&Ox4, no c/o pain at this time, is DOE and remains on 2L acute. High fall risk d/t fall within past 6 months, bed alarm on and patient educated to call for assistance.   Problem: Education: Goal: Knowledge of General Education information will improve Description: Including pain rating scale, medication(s)/side effects and non-pharmacologic comfort measures Outcome: Progressing   Problem: Nutrition: Goal: Adequate nutrition will be maintained Outcome: Progressing   Problem: Pain Managment: Goal: General experience of comfort will improve Outcome: Progressing   Problem: Safety: Goal: Ability to remain free from injury will improve Outcome: Progressing

## 2019-04-20 NOTE — ED Notes (Addendum)
Brought pt tray, set it up for him, encouraged him to push himself up in bed which caused him to get fatigued quickly. Pt had taken [place] off at some point, sats 88% on RA, 2L per [place] reapplied. Bed locked and low, call light in reach.

## 2019-04-20 NOTE — ED Notes (Signed)
. ED TO INPATIENT HANDOFF REPORT  ED Nurse Name and Phone #: Willene Hatchet Name/Age/Gender [name] 69 y.o. male Room/Bed: ED30A/ED30A  Code Status   Code Status: Full Code  Home/SNF/Other Home Patient oriented to: self, place, time and situation Is this baseline? Yes   Triage Complete: Triage complete  Chief Complaint Acute hypoxemic respiratory failure due to COVID-19 ([place]) [U07.1, J96.01]  Triage Note Pt arrives from home via [facility]. Home health nurse called PD for wellness check per EMS.   Pt has hx of COPD and hypertension.   Pt received 125 solumedrol, 1 albuterol, 1 duoneb  20G L AC  BP 186/70 BS 131 T 99.9 oral    Allergies No Known Allergies  Level of Care/Admitting Diagnosis ED Disposition    ED Disposition Condition [facility] Area: [place] [[id number]]  Level of Care: Med-Surg [16]  Covid Evaluation: Confirmed COVID Positive  Diagnosis: Acute hypoxemic respiratory failure due to COVID-19 [facility]) [[id number]]  Admitting Physician: Lenore Cordia [[id number]]  Attending Physician: Lenore Cordia [[id number]]  Estimated length of stay: past midnight tomorrow  Certification:: I certify this patient will need inpatient services for at least 2 midnights       B Medical/Surgery History Past Medical History:  Diagnosis Date  . Anemia   . 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 CHF (congestive heart failure) ([place])   . Chronic kidney disease   . COPD (chronic obstructive pulmonary disease) ([place])    NO INHALERS  . Diabetes mellitus    NO MEDS  . GERD (gastroesophageal reflux disease)   . Hepatic cirrhosis ([place])   . Hypertension   . Leg swelling   . Loose, teeth    PATIENT STATES "HAS 6 TEETH, SOME ARE LOOSE"  . Neuromuscular disorder ([place])    RIGHT HAND AND FINGERS NUMB  . Shortness of breath dyspnea    WITH EXERTION   . 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 APPENDECTOMY    . COLONOSCOPY WITH PROPOFOL N/A 11/19/2014   Procedure: COLONOSCOPY WITH PROPOFOL;  Surgeon: Lucilla Lame, MD;  Location: [place];  Service: Endoscopy;  Laterality: N/A;  DIABETIC-ORAL MEDS  . COLONOSCOPY WITH PROPOFOL N/A 10/04/2016   Procedure: COLONOSCOPY WITH PROPOFOL;  Surgeon: Jonathon Bellows, MD;  Location: [facility] ENDOSCOPY;  Service: Endoscopy;  Laterality: N/A;  . ESOPHAGOGASTRODUODENOSCOPY (EGD) WITH PROPOFOL N/A 10/04/2016   Procedure: ESOPHAGOGASTRODUODENOSCOPY (EGD) WITH PROPOFOL;  Surgeon: Jonathon Bellows, MD;  Location: [facility] ENDOSCOPY;  Service: Endoscopy;  Laterality: N/A;  . GIVENS CAPSULE STUDY N/A 11/13/2016   Procedure: GIVENS CAPSULE STUDY;  Surgeon: Jonathon Bellows, MD;  Location: [facility] ENDOSCOPY;  Service: Gastroenterology;  Laterality: N/A;  . INCISION AND DRAINAGE PERIRECTAL ABSCESS Right 12/16/2017   Procedure: EXCISION BASAL CELL CANCER/ GLUTEAL ABSCESS AND FLAP COVERAGE;  Surgeon: Robert Bellow, MD;  Location: [facility] ORS;  Service: General;  Laterality: Right;  . INGUINAL LYMPH NODE BIOPSY Right 01/26/2016   Procedure: INGUINAL LYMPH NODE BIOPSY;  Surgeon: Clayburn Pert, MD;  Location: [facility] ORS;  Service: General;  Laterality: Right;  . RECTAL BIOPSY N/A 11/02/2014   Procedure: BIOPSY RECTAL;  Surgeon: Marlyce Huge, MD;  Location: [facility] ORS;  Service: General;  Laterality: N/A;  . RECTAL EXAM UNDER ANESTHESIA N/A 11/02/2014   Procedure: RECTAL EXAM UNDER ANESTHESIA;  Surgeon: Marlyce Huge,  MD;  Location: [facility] ORS;  Service: General;  Laterality: N/A;  . TONSILLECTOMY       A IV Location/Drains/Wounds Patient Lines/Drains/Airways Status   Active Line/Drains/Airways    Name:   Placement date:   Placement time:   Site:   Days:   Peripheral IV 04/19/19 Left Antecubital   04/19/19    2003    Antecubital   1          Intake/Output Last 24  hours  Intake/Output Summary (Last 24 hours) at 04/20/2019 1256 Last data filed at 04/20/2019 0926 Gross per 24 hour  Intake 200 ml  Output 400 ml  Net -200 ml    Labs/Imaging Results for orders placed or performed during the hospital encounter of 04/19/19 (from the past 48 hour(s))  Comprehensive metabolic panel     Status: Abnormal   Collection Time: 04/19/19  7:57 PM  Result Value Ref Range   Sodium 130 (L) 135 - 145 mmol/L   Potassium 4.9 3.5 - 5.1 mmol/L   Chloride 102 98 - 111 mmol/L   CO2 18 (L) 22 - 32 mmol/L   Glucose, Bld 138 (H) 70 - 99 mg/dL   BUN 60 (H) 8 - 23 mg/dL   Creatinine, Ser 4.50 (H) 0.61 - 1.24 mg/dL   Calcium 7.7 (L) 8.9 - 10.3 mg/dL   Total Protein 7.1 6.5 - 8.1 g/dL   Albumin 2.6 (L) 3.5 - 5.0 g/dL   AST 57 (H) 15 - 41 U/L   ALT 37 0 - 44 U/L   Alkaline Phosphatase 103 38 - 126 U/L   Total Bilirubin 0.7 0.3 - 1.2 mg/dL   GFR calc non Af Amer 13 (L) >60 mL/min   GFR calc Af Amer 15 (L) >60 mL/min   Anion gap 10 5 - 15    Comment: Performed at [facility], [address] [place], [place] [address]  CBC with Differential     Status: Abnormal   Collection Time: 04/19/19  7:57 PM  Result Value Ref Range   WBC 9.4 4.0 - 10.5 K/uL   RBC 2.97 (L) 4.22 - 5.81 MIL/uL   Hemoglobin 8.9 (L) 13.0 - 17.0 g/dL   HCT 27.1 (L) 39.0 - 52.0 %   MCV 91.2 80.0 - 100.0 fL   MCH 30.0 26.0 - 34.0 pg   MCHC 32.8 30.0 - 36.0 g/dL   RDW 13.1 11.5 - 15.5 %   Platelets 183 150 - 400 K/uL   nRBC 0.0 0.0 - 0.2 %   Neutrophils Relative % 71 %   Neutro Abs 6.7 1.7 - 7.7 K/uL   Lymphocytes Relative 10 %   Lymphs Abs 0.9 0.7 - 4.0 K/uL   Monocytes Relative 18 %   Monocytes Absolute 1.7 (H) 0.1 - 1.0 K/uL   Eosinophils Relative 0 %   Eosinophils Absolute 0.0 0.0 - 0.5 K/uL   Basophils Relative 0 %   Basophils Absolute 0.0 0.0 - 0.1 K/uL   Immature Granulocytes 1 %   Abs Immature Granulocytes 0.10 (H) 0.00 - 0.07 K/uL    Comment: Performed at [facility], [place], [place] [address]  Troponin I (High Sensitivity)     Status: Abnormal   Collection Time: 04/19/19  7:57 PM  Result Value Ref Range   Troponin I (High Sensitivity) 27 (H) <18 ng/L    Comment: (NOTE) Elevated high sensitivity troponin I (hsTnI) values and significant  changes across serial measurements may suggest  ACS but many other  chronic and acute conditions are known to elevate hsTnI results.  Refer to the "Links" section for chest pain algorithms and additional  guidance. Performed at [facility], [address] [place], [place] [address]   Lactic acid, plasma     Status: None   Collection Time: 04/19/19  7:57 PM  Result Value Ref Range   Lactic Acid, Venous 0.7 0.5 - 1.9 mmol/L    Comment: Performed at [facility], [address] [place], [place] [address]  Brain natriuretic peptide     Status: Abnormal   Collection Time: 04/19/19  7:57 PM  Result Value Ref Range   B Natriuretic Peptide 547.0 (H) 0.0 - 100.0 pg/mL    Comment: Performed at [facility], [address] [place], [place] [address]  Fibrin derivatives D-Dimer     Status: Abnormal   Collection Time: 04/19/19  7:57 PM  Result Value Ref Range   Fibrin derivatives D-dimer ([facility]) 1,126.72 (H) 0.00 - 499.00 ng/mL (FEU)    Comment: (NOTE) <> Exclusion of Venous Thromboembolism (VTE) - OUTPATIENT ONLY   (Emergency Department or [place])   0-499 ng/ml (FEU): With a low to intermediate pretest probability                      for VTE this test result excludes the diagnosis                      of VTE.   >499 ng/ml (FEU) : VTE not excluded; additional work up for VTE is                      required. <> Testing on Inpatients and Evaluation of Disseminated Intravascular   Coagulation (DIC) Reference Range:   0-499 ng/ml (FEU) Performed at [facility], [address] [place], [place] [address]   Ferritin     Status: None   Collection Time:  04/19/19  7:57 PM  Result Value Ref Range   Ferritin 196 24 - 336 ng/mL    Comment: Performed at [facility], [address], [place], [place] [address]  Fibrinogen     Status: Abnormal   Collection Time: 04/19/19  7:57 PM  Result Value Ref Range   Fibrinogen 539 (H) 210 - 475 mg/dL    Comment: Performed at [facility], [address] [place], [place] [address]  Triglycerides     Status: Abnormal   Collection Time: 04/19/19  7:57 PM  Result Value Ref Range   Triglycerides 194 (H) <150 mg/dL    Comment: Performed at [facility], [address] [place], [place] [address]  C-reactive protein     Status: Abnormal   Collection Time: 04/19/19  7:57 PM  Result Value Ref Range   CRP 1.1 (H) <1.0 mg/dL    Comment: Performed at [facility], [place] [address], [place], [place] [address]  Respiratory Panel by RT PCR (Flu A&B, Covid) - Nasopharyngeal Swab     Status: Abnormal   Collection Time: 04/19/19  8:02 PM   Specimen: Nasopharyngeal Swab  Result Value Ref Range   SARS Coronavirus 2 by RT PCR POSITIVE (A) NEGATIVE    Comment: RESULT CALLED TO, READ BACK BY AND VERIFIED WITH: MAC BROWN 04/19/19 AT 2227 HS    Influenza A by PCR NEGATIVE NEGATIVE   Influenza B by PCR NEGATIVE NEGATIVE    Comment: (  NOTE) The Xpert Xpress SARS-CoV-2/FLU/RSV assay is intended as an aid in  the diagnosis of influenza from Nasopharyngeal swab specimens and  should not be used as a sole basis for treatment. Nasal washings and  aspirates are unacceptable for Xpert Xpress SARS-CoV-2/FLU/RSV  testing. Fact Sheet for Patients: PinkCheek.be Fact Sheet for Healthcare Providers: GravelBags.it This test is not yet approved or cleared by the [place] FDA and  has been authorized for detection and/or diagnosis of SARS-CoV-2 by  FDA under an Emergency Use Authorization (EUA). This EUA will remain  in effect (meaning this  test can be used) for the duration of the  Covid-19 declaration under Section 564(b)(1) of the Act, 21  U.S.C. section 360bbb-3(b)(1), unless the authorization is  terminated or revoked. Performed at [facility], [address] [place], [place] [address]   POC SARS Coronavirus 2 Ag     Status: None   Collection Time: 04/19/19  8:19 PM  Result Value Ref Range   SARS Coronavirus 2 Ag NEGATIVE NEGATIVE    Comment: (NOTE) SARS-CoV-2 antigen NOT DETECTED.  Negative results are presumptive.  Negative results do not preclude SARS-CoV-2 infection and should not be used as the sole basis for treatment or other patient management decisions, including infection  control decisions, particularly in the presence of clinical signs and  symptoms consistent with COVID-19, or in those who have been in contact with the virus.  Negative results must be combined with clinical observations, patient history, and epidemiological information. The expected result is Negative. Fact Sheet for Patients: PodPark.tn Fact Sheet for Healthcare Providers: GiftContent.is This test is not yet approved or cleared by the [place] FDA and  has been authorized for detection and/or diagnosis of SARS-CoV-2 by FDA under an Emergency Use Authorization (EUA).  This EUA will remain in effect (meaning this test can be used) for the duration of  the COVID-19 de claration under Section 564(b)(1) of the Act, 21 U.S.C. section 360bbb-3(b)(1), unless the authorization is terminated or revoked sooner.   Lactic acid, plasma     Status: None   Collection Time: 04/19/19 10:45 PM  Result Value Ref Range   Lactic Acid, Venous 0.6 0.5 - 1.9 mmol/L    Comment: Performed at [facility], [place], [place] [address]  Troponin I (High Sensitivity)     Status: Abnormal   Collection Time: 04/19/19 10:45 PM  Result Value Ref Range   Troponin I  (High Sensitivity) 26 (H) <18 ng/L    Comment: (NOTE) Elevated high sensitivity troponin I (hsTnI) values and significant  changes across serial measurements may suggest ACS but many other  chronic and acute conditions are known to elevate hsTnI results.  Refer to the "Links" section for chest pain algorithms and additional  guidance. Performed at [facility], [address] [place], [place] [address]   Lactate dehydrogenase     Status: None   Collection Time: 04/19/19 10:45 PM  Result Value Ref Range   LDH 154 98 - 192 U/L    Comment: Performed at [facility], [address] [place], [place] [address]  Procalcitonin     Status: None   Collection Time: 04/19/19 10:45 PM  Result Value Ref Range   Procalcitonin 0.51 ng/mL    Comment:        Interpretation: PCT > 0.5 ng/mL and <= 2 ng/mL: Systemic infection (sepsis) is possible, but other conditions are known to elevate PCT as well. (NOTE)  Sepsis PCT Algorithm           Lower Respiratory Tract                                      Infection PCT Algorithm    ----------------------------     ----------------------------         PCT < 0.25 ng/mL                PCT < 0.10 ng/mL         Strongly encourage             Strongly discourage   discontinuation of antibiotics    initiation of antibiotics    ----------------------------     -----------------------------       PCT 0.25 - 0.50 ng/mL            PCT 0.10 - 0.25 ng/mL               OR       >80% decrease in PCT            Discourage initiation of                                            antibiotics      Encourage discontinuation           of antibiotics    ----------------------------     -----------------------------         PCT >= 0.50 ng/mL              PCT 0.26 - 0.50 ng/mL                AND       <80% decrease in PCT             Encourage initiation of                                             antibiotics       Encourage continuation            of antibiotics    ----------------------------     -----------------------------        PCT >= 0.50 ng/mL                  PCT > 0.50 ng/mL               AND         increase in PCT                  Strongly encourage                                      initiation of antibiotics    Strongly encourage escalation           of antibiotics                                     -----------------------------  PCT <= 0.25 ng/mL                                                 OR                                        > 80% decrease in PCT                                     Discontinue / Do not initiate                                             antibiotics Performed at [facility], [address] [place], [place] [address]   ABO/Rh     Status: None   Collection Time: 04/20/19  3:00 AM  Result Value Ref Range   ABO/RH(D)      A POS Performed at [facility], [address] [place], [place] [address]   CBC with Differential/Platelet     Status: Abnormal   Collection Time: 04/20/19  3:00 AM  Result Value Ref Range   WBC 4.0 4.0 - 10.5 K/uL   RBC 2.79 (L) 4.22 - 5.81 MIL/uL   Hemoglobin 8.3 (L) 13.0 - 17.0 g/dL   HCT 25.4 (L) 39.0 - 52.0 %   MCV 91.0 80.0 - 100.0 fL   MCH 29.7 26.0 - 34.0 pg   MCHC 32.7 30.0 - 36.0 g/dL   RDW 13.0 11.5 - 15.5 %   Platelets 148 (L) 150 - 400 K/uL   nRBC 0.0 0.0 - 0.2 %   Neutrophils Relative % 93 %   Neutro Abs 3.8 1.7 - 7.7 K/uL   Lymphocytes Relative 5 %   Lymphs Abs 0.2 (L) 0.7 - 4.0 K/uL   Monocytes Relative 1 %   Monocytes Absolute 0.0 (L) 0.1 - 1.0 K/uL   Eosinophils Relative 0 %   Eosinophils Absolute 0.0 0.0 - 0.5 K/uL   Basophils Relative 0 %   Basophils Absolute 0.0 0.0 - 0.1 K/uL   Immature Granulocytes 1 %   Abs Immature Granulocytes 0.04 0.00 - 0.07 K/uL    Comment: Performed at [facility], [address] [place], [place] [address]  Comprehensive  metabolic panel     Status: Abnormal   Collection Time: 04/20/19  3:00 AM  Result Value Ref Range   Sodium 130 (L) 135 - 145 mmol/L   Potassium 4.9 3.5 - 5.1 mmol/L   Chloride 104 98 - 111 mmol/L   CO2 16 (L) 22 - 32 mmol/L   Glucose, Bld 210 (H) 70 - 99 mg/dL   BUN 66 (H) 8 - 23 mg/dL   Creatinine, Ser 4.61 (H) 0.61 - 1.24 mg/dL   Calcium 7.5 (L) 8.9 - 10.3 mg/dL   Total Protein 6.4 (L) 6.5 - 8.1 g/dL   Albumin 2.2 (L) 3.5 - 5.0 g/dL   AST 44 (H) 15 - 41 U/L   ALT 33 0 - 44 U/L   Alkaline Phosphatase 93 38 - 126 U/L  Total Bilirubin 0.6 0.3 - 1.2 mg/dL   GFR calc non Af Amer 12 (L) >60 mL/min   GFR calc Af Amer 14 (L) >60 mL/min   Anion gap 10 5 - 15    Comment: Performed at [facility], [address] [place], [place] [address]  Fibrin derivatives D-Dimer [facility] only)     Status: Abnormal   Collection Time: 04/20/19  3:00 AM  Result Value Ref Range   Fibrin derivatives D-dimer ([facility]) 1,007.62 (H) 0.00 - 499.00 ng/mL (FEU)    Comment: (NOTE) <> Exclusion of Venous Thromboembolism (VTE) - OUTPATIENT ONLY   (Emergency Department or [place])   0-499 ng/ml (FEU): With a low to intermediate pretest probability                      for VTE this test result excludes the diagnosis                      of VTE.   >499 ng/ml (FEU) : VTE not excluded; additional work up for VTE is                      required. <> Testing on Inpatients and Evaluation of Disseminated Intravascular   Coagulation (DIC) Reference Range:   0-499 ng/ml (FEU) Performed at [facility], [address] [place], [place] [address]   C-reactive protein     Status: None   Collection Time: 04/20/19  3:00 AM  Result Value Ref Range   CRP 0.9 <1.0 mg/dL    Comment: Performed at [facility] [address] [place], [place] [address]  Ferritin     Status: None   Collection Time: 04/20/19  3:00 AM  Result Value Ref Range   Ferritin 217 24 - 336 ng/mL    Comment: Performed at [name], [address] [place], [place] [address]  Magnesium     Status: None   Collection Time: 04/20/19  3:00 AM  Result Value Ref Range   Magnesium 2.2 1.7 - 2.4 mg/dL    Comment: Performed at [facility], [address] [place], [place] [address]  Phosphorus     Status: Abnormal   Collection Time: 04/20/19  3:00 AM  Result Value Ref Range   Phosphorus 5.7 (H) 2.5 - 4.6 mg/dL    Comment: Performed at [facility], [address] [place], [place] [address]  Hemoglobin A1c     Status: None   Collection Time: 04/20/19  3:00 AM  Result Value Ref Range   Hgb A1c MFr Bld 5.1 4.8 - 5.6 %    Comment: (NOTE) Pre diabetes:          5.7%-6.4% Diabetes:              >6.4% Glycemic control for   <7.0% adults with diabetes    Mean Plasma Glucose 99.67 mg/dL    Comment: Performed at [facility] [address], [place], [place] [address]  HIV Antibody (routine testing w rflx)     Status: None   Collection Time: 04/20/19  3:00 AM  Result Value Ref Range   HIV Screen 4th Generation wRfx NON REACTIVE NON REACTIVE    Comment: Performed at [facility] [address], [place],  [address]   DG Chest Portable 1 View  Result Date: 04/19/2019 CLINICAL DATA:  Shortness of breath. EXAM: PORTABLE CHEST 1  VIEW COMPARISON:  April 14, 2019 FINDINGS: There is been interval development of bilateral airspace opacities primarily in the mid lung zones bilaterally. The heart size remains enlarged. There is persistent blunting of the costophrenic angles. There is no acute osseous abnormality. Aortic calcifications are noted. The lungs appear to be somewhat hyperexpanded. IMPRESSION: 1. Interval development of bilateral airspace opacities concerning for developing multifocal pneumonia. Pulmonary edema can have a similar appearance in the appropriate clinical setting. 2. Hyperexpanded lungs, similar to prior study. 3. Persistent blunting of the costophrenic angles  which may be secondary to small bilateral pleural effusions and or atelectasis. Electronically Signed   By: Constance Holster M.D.   On: 04/19/2019 20:06    Pending Labs Unresulted Labs (From admission, onward)    Start     Ordered   04/20/19 0500  CBC with Differential/Platelet  Daily,   STAT     04/19/19 2325   04/20/19 0500  Comprehensive metabolic panel  Daily,   STAT     04/19/19 2325   04/20/19 0500  Fibrin derivatives D-Dimer ([facility] only)  Daily,   STAT     04/19/19 2325   04/20/19 0500  C-reactive protein  Daily,   STAT     04/19/19 2325   04/20/19 0500  Ferritin  Daily,   STAT     04/19/19 2325   04/20/19 0500  Magnesium  Daily,   STAT     04/19/19 2325   04/20/19 0500  Phosphorus  Daily,   STAT     04/19/19 2325          Vitals/Pain Today's Vitals   04/20/19 0100 04/20/19 0115 04/20/19 0815 04/20/19 0924  BP: 139/84  (!) 141/83   Pulse:  69 78   Resp: (!) _0 Temp:    97.8 F (36.6 C)  TempSrc:    Oral  SpO2:  95% 100%   Weight:      Height:      PainSc:   0-No pain     Isolation Precautions Airborne and Contact precautions  Medications Medications  azithromycin (ZITHROMAX) 500 mg in sodium chloride 0.9 % 250 mL IVPB (0 mg Intravenous Stopped 04/20/19 0112)  heparin injection 5,000 Units (5,000 Units Subcutaneous Given 04/20/19 0812)  remdesivir 200 mg in sodium chloride 0.9% 250 mL IVPB (0 mg Intravenous Stopped 04/20/19 0148)    Followed by  remdesivir 100 mg in sodium chloride 0.9 % 100 mL IVPB (0 mg Intravenous Stopped 04/20/19 0924)  Ipratropium-Albuterol (COMBIVENT) respimat 1 puff (1 puff Inhalation Given 04/20/19 0814)  methylPREDNISolone sodium succinate (SOLU-MEDROL) 125 mg/2 mL injection 40.625 mg (40.625 mg Intravenous Given 04/20/19 0005)  guaiFENesin-dextromethorphan (ROBITUSSIN DM) 100-10 MG/5ML syrup 10 mL (has no administration in time range)  chlorpheniramine-HYDROcodone (TUSSIONEX) 10-8 MG/5ML suspension 5 mL (has no administration in  time range)  ascorbic acid (VITAMIN C) tablet 500 mg (500 mg Oral Given 04/20/19 0812)  zinc sulfate capsule 220 mg (220 mg Oral Given 04/20/19 0813)  acetaminophen (TYLENOL) tablet 650 mg (has no administration in time range)  ondansetron (ZOFRAN) tablet 4 mg (has no administration in time range)    Or  ondansetron (ZOFRAN) injection 4 mg (has no administration in time range)  albuterol (VENTOLIN HFA) 108 (90 Base) MCG/ACT inhaler 1-2 puff (has no administration in time range)  amLODipine (NORVASC) tablet 10 mg (10 mg Oral Given 04/20/19 0813)  lactulose (CHRONULAC) 10 GM/15ML solution 10 g (10 g Oral Given 04/20/19 0813)  magnesium  sulfate IVPB 2 g 50 mL (0 g Intravenous Stopped 04/19/19 2149)  ipratropium-albuterol (DUONEB) 0.5-2.5 (3) MG/3ML nebulizer solution 3 mL (3 mLs Nebulization Given 04/19/19 2000)  ipratropium-albuterol (DUONEB) 0.5-2.5 (3) MG/3ML nebulizer solution 3 mL (3 mLs Nebulization Given 04/19/19 2000)    Mobility walks with person assist Low fall risk   Focused Assessments Pulmonary Assessment Handoff:  Lung sounds:   O2 Device: Room Air O2 Flow Rate (L/min): 2 L/min      R Recommendations: See Admitting Provider Note  Report given to:   Additional Notes:

## 2019-04-21 LAB — CBC WITH DIFFERENTIAL/PLATELET
Abs Immature Granulocytes: 0.05 10*3/uL (ref 0.00–0.07)
Basophils Absolute: 0 10*3/uL (ref 0.0–0.1)
Basophils Relative: 0 %
Eosinophils Absolute: 0 10*3/uL (ref 0.0–0.5)
Eosinophils Relative: 0 %
HCT: 25.4 % — ABNORMAL LOW (ref 39.0–52.0)
Hemoglobin: 8.4 g/dL — ABNORMAL LOW (ref 13.0–17.0)
Immature Granulocytes: 1 %
Lymphocytes Relative: 8 %
Lymphs Abs: 0.4 10*3/uL — ABNORMAL LOW (ref 0.7–4.0)
MCH: 29.5 pg (ref 26.0–34.0)
MCHC: 33.1 g/dL (ref 30.0–36.0)
MCV: 89.1 fL (ref 80.0–100.0)
Monocytes Absolute: 0.3 10*3/uL (ref 0.1–1.0)
Monocytes Relative: 7 %
Neutro Abs: 4.3 10*3/uL (ref 1.7–7.7)
Neutrophils Relative %: 84 %
Platelets: 194 10*3/uL (ref 150–400)
RBC: 2.85 MIL/uL — ABNORMAL LOW (ref 4.22–5.81)
RDW: 12.5 % (ref 11.5–15.5)
WBC: 5 10*3/uL (ref 4.0–10.5)
nRBC: 0 % (ref 0.0–0.2)

## 2019-04-21 LAB — COMPREHENSIVE METABOLIC PANEL
ALT: 30 U/L (ref 0–44)
AST: 34 U/L (ref 15–41)
Albumin: 2.3 g/dL — ABNORMAL LOW (ref 3.5–5.0)
Alkaline Phosphatase: 84 U/L (ref 38–126)
Anion gap: 9 (ref 5–15)
BUN: 86 mg/dL — ABNORMAL HIGH (ref 8–23)
CO2: 17 mmol/L — ABNORMAL LOW (ref 22–32)
Calcium: 7.7 mg/dL — ABNORMAL LOW (ref 8.9–10.3)
Chloride: 104 mmol/L (ref 98–111)
Creatinine, Ser: 4.42 mg/dL — ABNORMAL HIGH (ref 0.61–1.24)
GFR calc Af Amer: 15 mL/min — ABNORMAL LOW (ref >60)
GFR calc non Af Amer: 13 mL/min — ABNORMAL LOW (ref >60)
Glucose, Bld: 292 mg/dL — ABNORMAL HIGH (ref 70–99)
Potassium: 4.7 mmol/L (ref 3.5–5.1)
Sodium: 130 mmol/L — ABNORMAL LOW (ref 135–145)
Total Bilirubin: 0.4 mg/dL (ref 0.3–1.2)
Total Protein: 6.5 g/dL (ref 6.5–8.1)

## 2019-04-21 LAB — PHOSPHORUS: Phosphorus: 6.9 mg/dL — ABNORMAL HIGH (ref 2.5–4.6)

## 2019-04-21 LAB — MAGNESIUM: Magnesium: 2.3 mg/dL (ref 1.7–2.4)

## 2019-04-21 LAB — C-REACTIVE PROTEIN: CRP: 0.8 mg/dL (ref <1.0)

## 2019-04-21 LAB — FIBRIN DERIVATIVES D-DIMER (ARMC ONLY): Fibrin derivatives D-dimer (ARMC): 745.64 ng/mL (FEU) — ABNORMAL HIGH (ref 0.00–499.00)

## 2019-04-21 LAB — FERRITIN: Ferritin: 284 ng/mL (ref 24–336)

## 2019-04-21 MED ORDER — TORSEMIDE 20 MG PO TABS
10.0000 mg | ORAL_TABLET | Freq: Every day | ORAL | Status: DC
  Administered 2019-04-21 – 2019-04-22 (×2): 10 mg via ORAL
  Filled 2019-04-21 (×2): qty 1

## 2019-04-21 NOTE — Progress Notes (Signed)
Pts brother in law emergency contact given daily update, pt agrees

## 2019-04-21 NOTE — Progress Notes (Signed)
PROGRESS NOTE    [name]  ZPH:[id number] DOB: 25-May-1950 DOA: 04/19/2019 PCP: [name] Maltese, MD    Brief Narrative:  OVA [name] is a 69 y.o. male with medical history significant for COPD, type 2 diabetes, hypertension, CKD stage IV, cirrhosis, IgA MGUS, and anemia of chronic disease and iron deficiency who presents to the ED for evaluation of dyspnea. Respiratory PCR panel is positive for SARS-CoV-2.  Influenza a and B are negative.   Consultants:   none  Procedures: none  Antimicrobials:   remdesivir   Subjective: Sob improving slowly but still not at baseline. No cp.  No other complaints  Objective: Vitals:   04/20/19 1416 04/20/19 1618 04/21/19 0059 04/21/19 0740  BP: (!) 147/75 140/72 123/70 (!) 162/86  Pulse: (!) 57 61    Resp: _0 Temp: 98.6 F (37 C)  98.7 F (37.1 C) 97.6 F (36.4 C)  TempSrc: Oral  Oral Oral  SpO2: 99% 100%  99%  Weight:      Height:        Intake/Output Summary (Last 24 hours) at 04/21/2019 1428 Last data filed at 04/21/2019 1220 Gross per 24 hour  Intake 622.35 ml  Output 875 ml  Net -252.65 ml   Filed Weights   04/19/19 1953  Weight: 81 kg    Examination:  General exam: Appears calm and comfortable  Respiratory system: Clear to auscultation with decreased breath sounds, no Rales wheezing or rhonchi's  Cardiovascular system: S1 & S2 heard, RRR. No JVD, murmurs, rubs, gallops or clicks.  Gastrointestinal system: Soft abdomen is nondistended, nontender. No organomegaly or masses felt. Normal bowel sounds heard. Central nervous system: Awake and alert, no focal neurological deficits. Extremities: No edema Skin: Warm dry Psychiatry: Judgement and insight appear normal. Mood & affect appropriate.     Data Reviewed: I have personally reviewed following labs and imaging studies  CBC: Recent Labs  Lab 04/19/19 1957 04/20/19 0300 04/21/19 0254  WBC 9.4 4.0 5.0  NEUTROABS 6.7 3.8 4.3  HGB 8.9* 8.3* 8.4*    HCT 27.1* 25.4* 25.4*  MCV 91.2 91.0 89.1  PLT 183 148* 801   Basic Metabolic Panel: Recent Labs  Lab 04/19/19 1957 04/20/19 0300 04/21/19 0254  NA 130* 130* 130*  K 4.9 4.9 4.7  CL 102 104 104  CO2 18* 16* 17*  GLUCOSE 138* 210* 292*  BUN 60* 66* 86*  CREATININE 4.50* 4.61* 4.42*  CALCIUM 7.7* 7.5* 7.7*  MG  --  2.2 2.3  PHOS  --  5.7* 6.9*   GFR: Estimated Creatinine Clearance: 17 mL/min (A) (by C-G formula based on SCr of 4.42 mg/dL (H)). Liver Function Tests: Recent Labs  Lab 04/19/19 1957 04/20/19 0300 04/21/19 0254  AST 57* 44* 34  ALT 37 33 30  ALKPHOS 103 93 84  BILITOT 0.7 0.6 0.4  PROT 7.1 6.4* 6.5  ALBUMIN 2.6* 2.2* 2.3*   No results for input(s): LIPASE, AMYLASE in the last 168 hours. No results for input(s): AMMONIA in the last 168 hours. Coagulation Profile: No results for input(s): INR, PROTIME in the last 168 hours. Cardiac Enzymes: No results for input(s): CKTOTAL, CKMB, CKMBINDEX, TROPONINI in the last 168 hours. BNP (last 3 results) No results for input(s): PROBNP in the last 8760 hours. HbA1C: Recent Labs    04/20/19 0300  HGBA1C 5.1   CBG: No results for input(s): GLUCAP in the last 168 hours. Lipid Profile: Recent Labs    04/19/19  1957  TRIG 194*   Thyroid Function Tests: No results for input(s): TSH, T4TOTAL, [name]4, T3FREE, THYROIDAB in the last 72 hours. Anemia Panel: Recent Labs    04/20/19 0300 04/21/19 0254  FERRITIN 217 284   Sepsis Labs: Recent Labs  Lab 04/19/19 1957 04/19/19 2245  PROCALCITON  --  0.51  LATICACIDVEN 0.7 0.6    Recent Results (from the past 240 hour(s))  Respiratory Panel by RT PCR (Flu A&B, Covid) - Nasopharyngeal Swab     Status: Abnormal   Collection Time: 04/19/19  8:02 PM   Specimen: Nasopharyngeal Swab  Result Value Ref Range Status   SARS Coronavirus 2 by RT PCR POSITIVE (A) NEGATIVE Final    Comment: RESULT CALLED TO, READ BACK BY AND VERIFIED WITH: MAC BROWN 04/19/19 AT 2227  HS    Influenza A by PCR NEGATIVE NEGATIVE Final   Influenza B by PCR NEGATIVE NEGATIVE Final    Comment: (NOTE) The Xpert Xpress SARS-CoV-2/FLU/RSV assay is intended as an aid in  the diagnosis of influenza from Nasopharyngeal swab specimens and  should not be used as a sole basis for treatment. Nasal washings and  aspirates are unacceptable for Xpert Xpress SARS-CoV-2/FLU/RSV  testing. Fact Sheet for Patients: PinkCheek.be Fact Sheet for Healthcare Providers: GravelBags.it This test is not yet approved or cleared by the [place] FDA and  has been authorized for detection and/or diagnosis of SARS-CoV-2 by  FDA under an Emergency Use Authorization (EUA). This EUA will remain  in effect (meaning this test can be used) for the duration of the  Covid-19 declaration under Section 564(b)(1) of the Act, 21  U.S.C. section 360bbb-3(b)(1), unless the authorization is  terminated or revoked. Performed at [facility], [address], [place], [place] [address]          Radiology Studies: DG Chest Portable 1 View  Result Date: 04/19/2019 CLINICAL DATA:  Shortness of breath. EXAM: PORTABLE CHEST 1 VIEW COMPARISON:  April 14, 2019 FINDINGS: There is been interval development of bilateral airspace opacities primarily in the mid lung zones bilaterally. The heart size remains enlarged. There is persistent blunting of the costophrenic angles. There is no acute osseous abnormality. Aortic calcifications are noted. The lungs appear to be somewhat hyperexpanded. IMPRESSION: 1. Interval development of bilateral airspace opacities concerning for developing multifocal pneumonia. Pulmonary edema can have a similar appearance in the appropriate clinical setting. 2. Hyperexpanded lungs, similar to prior study. 3. Persistent blunting of the costophrenic angles which may be secondary to small bilateral pleural effusions and or  atelectasis. Electronically Signed   By: Constance Holster M.D.   On: 04/19/2019 20:06        Scheduled Meds: . amLODipine  10 mg Oral Daily  . vitamin C  500 mg Oral Daily  . heparin  5,000 Units Subcutaneous Q8H  . Ipratropium-Albuterol  1 puff Inhalation Q6H  . lactulose  10 g Oral BID  . methylPREDNISolone (SOLU-MEDROL) injection  0.5 mg/kg Intravenous Q12H  . zinc sulfate  220 mg Oral Daily   Continuous Infusions: . sodium chloride Stopped (04/21/19 0237)  . azithromycin Stopped (04/20/19 2334)  . remdesivir 100 mg in NS 100 mL Stopped (04/21/19 3295)    Assessment & Plan:   Principal Problem:   Acute hypoxemic respiratory failure due to COVID-19 [facility]) Active Problems:   Hypertension associated with diabetes ([place])   Cirrhosis (HCC)   Anemia in chronic kidney disease   Chronic kidney disease, stage 4 (severe) ([place])  Type 2 diabetes mellitus (HCC)   COPD with acute exacerbation (HCC)   Acute respiratory failure with hypoxia due to COVID-19 viral pneumonia: SARS-CoV-2 PCR positive 04/19/2019.  Chest x-ray with bilateral airspace opacities.  Wean off 02 -Start IV remdesivir per pharmacy protocol -Start IV Solu-Medrol 0.5 mg/kg twice daily -Continue supplemental oxygen as needed -Continue Combivent, flutter valve, incentive spirometer -Antitussives, vitamin C, zinc   COPD with acute exacerbation: Likely exacerbated by acute viral pneumonia.   -Continue IV Solu-Medrol, Combivent, albuterol as needed, supplemental oxygen -Continue azithromycin  CKD stage IV: Appears relatively stable.  Continue to monitor.  Type 2 diabetes: Not currently on medical management.  Check A1c, add SSI if needed.  Hypertension: Currently stable.  Continue amlodipine 10 mg daily.  Hepatic cirrhosis: Appears to be compensated, currently without encephalopathy.  However he had stated no bowel movement in 3 days Started on lactulose 10 mg twice daily, titrate to 2-3 bowel  movements per day. Resume torsemide Monitor NA  Anemia secondary to chronic kidney disease and iron deficiency: Chronic and stable without obvious bleeding.  Continue to monitor.   DVT prophylaxis: Subcutaneous heparin Code Status: Full code, confirmed with patient Family Communication: Discussed with patient, he has discussed with family Disposition Plan:  Still not at baseline and requiring IV steroids and iv Redmis. .  Can DC when medically more stable possibly 1-2 days     LOS: 2 days   Time spent: 45 minutes with more than 50% on [place], MD [facility] Pager [phone]  If 7PM-7AM, please contact night-coverage [email or web address] Password [facility] 04/21/2019, 2:28 PM progPatient ID: [name], male   DOB: 07-08-1950, 69 y.o.   MRN: [id number]

## 2019-04-21 NOTE — TOC Initial Note (Signed)
Transition of Care [facility]) - Initial/Assessment Note    Patient Details  Name: [name] MRN: [id number] Date of Birth: [birth year]  Transition of Care [facility]) CM/SW Contact:    Shelbie Hutching, RN Phone Number: 04/21/2019, 12:21 PM  Clinical Narrative:                 Patient admitted with COVID has a history of COPD, chronic kidney disease, and diabetes type 2.  Patient is from home where he lives alone.  Patient reports that his brother in law, [name], helps him by keeping up with his doctor appointments and carrying him to his doctors appointments.  Patient is independent with his ADL's and has no assistive devices.  Patient would benefit from home health RN and PT at discharge.  Referral for home health given to Floydene Flock with [facility]. Corene Cornea will let RNCM know if they can accept the patient for nursing and PT.   RNCM will cont to follow and assist with discharge planning.   Expected Discharge Plan: [place] Barriers to Discharge: Continued Medical Work up   Patient Goals and CMS Choice Patient states their goals for this hospitalization and ongoing recovery are:: To get well and get my health back CMS Medicare.gov Compare Post Acute Care list provided to:: Patient Choice offered to / list presented to : Patient  Expected Discharge Plan and Services Expected Discharge Plan: [place]   Discharge Planning Services: CM Consult Post Acute Care Choice: [place] arrangements for the past 2 months: Apartment                                      Prior Living Arrangements/Services Living arrangements for the past 2 months: Apartment Lives with:: Self Patient language and need for interpreter reviewed:: Yes Do you feel safe going back to the place where you live?: Yes      Need for Family Participation in Patient Care: Yes (Comment)(COVID, COPD, diabetes) Care giver support system in place?: Yes (comment)(brother  in law)   Criminal Activity/Legal Involvement Pertinent to Current Situation/Hospitalization: No - Comment as needed  Activities of Daily Living Home Assistive Devices/Equipment: None ADL Screening (condition at time of admission) Patient's cognitive ability adequate to safely complete daily activities?: No Is the patient deaf or have difficulty hearing?: No Does the patient have difficulty seeing, even when wearing glasses/contacts?: No Does the patient have difficulty concentrating, remembering, or making decisions?: No Patient able to express need for assistance with ADLs?: Yes Does the patient have difficulty dressing or bathing?: Yes Independently performs ADLs?: Yes (appropriate for developmental age) Does the patient have difficulty walking or climbing stairs?: Yes Weakness of Legs: Both Weakness of Arms/Hands: None  Permission Sought/Granted Permission sought to share information with : Case Manager, Family Supports, Other (comment) Permission granted to share information with : Yes, Verbal Permission Granted     Permission granted to share info w AGENCY: Home health agency  Permission granted to share info w Relationship: Brother in Retail banker     Emotional Assessment   Attitude/Demeanor/Rapport: Engaged Affect (typically observed): Accepting Orientation: : Oriented to Self, Oriented to Place, Oriented to  Time, Oriented to Situation Alcohol / Substance Use: Not Applicable Psych Involvement: No (comment)  Admission diagnosis:  Community acquired pneumonia, unspecified laterality [J18.9] Acute hypoxemic respiratory failure due to COVID-19 ([place]) [U07.1, J96.01] COVID-19 [  U07.1] Patient Active Problem List   Diagnosis Date Noted  . Acute hypoxemic respiratory failure due to COVID-19 ([place]) 04/19/2019  . Type 2 diabetes mellitus ([place]) 04/19/2019  . COPD with acute exacerbation ([place]) 04/19/2019  . Chronic kidney disease, stage 4 (severe) ([place]) 01/29/2019  .  Hyperparathyroidism due to renal insufficiency ([place]) 01/29/2019  . Proteinuria 01/29/2019  . Sebaceous cyst 09/24/2018  . MGUS (monoclonal gammopathy of unknown significance) 03/27/2018  . Anemia in chronic kidney disease 03/27/2018  . Rotator cuff arthropathy of right shoulder 02/05/2018  . ARF (acute renal failure) ([employer]) 01/21/2018  . Mass of perianal area 10/16/2017  . Mass of pharynx 11/09/2016  . Polyp of cecum 11/09/2016  . Chronic hepatitis C without hepatic coma ([place]) 11/09/2016  . Cirrhosis ([place]) 11/09/2016  . Iron deficiency anemia 09/19/2016  . Chest pain with high risk for cardiac etiology 08/13/2016  . SOB (shortness of breath) on exertion 08/13/2016  . Varicose veins of both lower extremities with inflammation 06/11/2016  . Hypertension associated with diabetes ([place]) 06/11/2016  . Chronic venous insufficiency 05/08/2016  . Diabetic foot ulcer with osteomyelitis ([place]) 05/08/2016  . Lymphedema 04/12/2016  . Ulcer of great toe ([place]) 04/12/2016  . 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 Anemia 01/26/2016  . Skin mass   . Lymphadenopathy, inguinal   . Symptomatic anemia 01/24/2016  . Hypoglycemia 09/13/2015  . Internal hemorrhoids 12/30/2014  . Mass of anus 11/01/2014   PCP:  [name], MD Pharmacy:   [facility] [address]), [place] [address] [place]) [place] [address] Phone: [phone] Fax: [phone]     Social Determinants of Health (SDOH) Interventions    Readmission Risk Interventions No flowsheet data found.

## 2019-04-21 NOTE — TOC Progression Note (Signed)
Transition of Care [facility]) - Progression Note    Patient Details  Name: [name] MRN: [id number] Date of Birth: 1950/10/19  Transition of Care [facility]) CM/SW Contact  Shelbie Hutching, RN Phone Number: 04/21/2019, 3:09 PM  Clinical Narrative:    [facility] unable to accept referral.  [name] with [facility] agreed to accept home health referral for RN and PT.    Expected Discharge Plan: [place] Barriers to Discharge: Continued Medical Work up  Expected Discharge Plan and Services Expected Discharge Plan: [place]   Discharge Planning Services: CM Consult Post Acute Care Choice: [place] arrangements for the past 2 months: Apartment                           HH Arranged: RN, PT [facility] Agency: [facility] [place] Date [place]: 04/21/19 Time [place]: 1508 Representative spoke with at [place]: [place] ([place]) Interventions    Readmission Risk Interventions No flowsheet data found.

## 2019-04-22 LAB — COMPREHENSIVE METABOLIC PANEL
ALT: 36 U/L (ref 0–44)
AST: 39 U/L (ref 15–41)
Albumin: 2.3 g/dL — ABNORMAL LOW (ref 3.5–5.0)
Alkaline Phosphatase: 82 U/L (ref 38–126)
Anion gap: 11 (ref 5–15)
BUN: 97 mg/dL — ABNORMAL HIGH (ref 8–23)
CO2: 18 mmol/L — ABNORMAL LOW (ref 22–32)
Calcium: 7.5 mg/dL — ABNORMAL LOW (ref 8.9–10.3)
Chloride: 100 mmol/L (ref 98–111)
Creatinine, Ser: 4.66 mg/dL — ABNORMAL HIGH (ref 0.61–1.24)
GFR calc Af Amer: 14 mL/min — ABNORMAL LOW (ref >60)
GFR calc non Af Amer: 12 mL/min — ABNORMAL LOW (ref >60)
Glucose, Bld: 392 mg/dL — ABNORMAL HIGH (ref 70–99)
Potassium: 4.7 mmol/L (ref 3.5–5.1)
Sodium: 129 mmol/L — ABNORMAL LOW (ref 135–145)
Total Bilirubin: 0.5 mg/dL (ref 0.3–1.2)
Total Protein: 6.6 g/dL (ref 6.5–8.1)

## 2019-04-22 LAB — FIBRIN DERIVATIVES D-DIMER (ARMC ONLY): Fibrin derivatives D-dimer (ARMC): 544.43 ng/mL (FEU) — ABNORMAL HIGH (ref 0.00–499.00)

## 2019-04-22 LAB — PHOSPHORUS: Phosphorus: 5.9 mg/dL — ABNORMAL HIGH (ref 2.5–4.6)

## 2019-04-22 LAB — CBC WITH DIFFERENTIAL/PLATELET
Abs Immature Granulocytes: 0.09 10*3/uL — ABNORMAL HIGH (ref 0.00–0.07)
Basophils Absolute: 0 10*3/uL (ref 0.0–0.1)
Basophils Relative: 0 %
Eosinophils Absolute: 0 10*3/uL (ref 0.0–0.5)
Eosinophils Relative: 0 %
HCT: 24.5 % — ABNORMAL LOW (ref 39.0–52.0)
Hemoglobin: 8.1 g/dL — ABNORMAL LOW (ref 13.0–17.0)
Immature Granulocytes: 2 %
Lymphocytes Relative: 4 %
Lymphs Abs: 0.2 10*3/uL — ABNORMAL LOW (ref 0.7–4.0)
MCH: 29.2 pg (ref 26.0–34.0)
MCHC: 33.1 g/dL (ref 30.0–36.0)
MCV: 88.4 fL (ref 80.0–100.0)
Monocytes Absolute: 0.2 10*3/uL (ref 0.1–1.0)
Monocytes Relative: 3 %
Neutro Abs: 5 10*3/uL (ref 1.7–7.7)
Neutrophils Relative %: 91 %
Platelets: 189 10*3/uL (ref 150–400)
RBC: 2.77 MIL/uL — ABNORMAL LOW (ref 4.22–5.81)
RDW: 12.5 % (ref 11.5–15.5)
WBC: 5.4 10*3/uL (ref 4.0–10.5)
nRBC: 0.6 % — ABNORMAL HIGH (ref 0.0–0.2)

## 2019-04-22 LAB — FERRITIN: Ferritin: 317 ng/mL (ref 24–336)

## 2019-04-22 LAB — C-REACTIVE PROTEIN: CRP: 0.8 mg/dL (ref <1.0)

## 2019-04-22 LAB — MAGNESIUM: Magnesium: 2.2 mg/dL (ref 1.7–2.4)

## 2019-04-22 MED ORDER — AZITHROMYCIN 250 MG PO TABS
250.0000 mg | ORAL_TABLET | Freq: Every day | ORAL | Status: DC
  Administered 2019-04-22: 11:00:00 250 mg via ORAL
  Filled 2019-04-22: qty 1

## 2019-04-22 MED ORDER — ASCORBIC ACID 500 MG PO TABS: 500.0000 mg | ORAL_TABLET | Freq: Every day | ORAL | 0 refills | Status: AC

## 2019-04-22 MED ORDER — AZITHROMYCIN 250 MG PO TABS: 250.0000 mg | ORAL_TABLET | Freq: Every day | ORAL | 0 refills | Status: AC

## 2019-04-22 MED ORDER — ZINC SULFATE 220 (50 ZN) MG PO CAPS: 220.0000 mg | ORAL_CAPSULE | Freq: Every day | ORAL | 0 refills | Status: AC

## 2019-04-22 NOTE — Discharge Summary (Signed)
Physician Discharge Summary  [name] ZOX:[id number] DOB: 05-07-1950 DOA: 04/19/2019  PCP: [name], MD  Admit date: 04/19/2019 Discharge date: 04/22/2019  Admitted From: Home Disposition:  Home with home health  Recommendations for Outpatient Follow-up:  1. Follow up with PCP in 1-2 weeks 2. Follow up at the outpatient Remdesevir infusion center 1/14 and 1/15 at 10AM.  See below for instructions.   Patient scheduled for outpatient Remdesivir infusion at 1000AM on Thursday 1/14 and Friday 1/15. Please advise them to report to [facility] at [address]  Drive to the security guard and tell them you are here for an infusion. They will direct you to the front entrance where we will come and get you.  For questions call [phone].  Thanks   Home Health: Yes Equipment/Devices: No  Discharge Condition:Stable CODE STATUS:FULL Diet recommendation: Regular  Brief/Interim Summary: [name] a 69 y.o.malewith medical history significant forCOPD, type 2 diabetes, hypertension, CKD stage IV, cirrhosis,IgA MGUS,and anemiaof chronic disease and iron deficiencywho presents to the ED for evaluation of dyspnea. Respiratory PCR panel is positive for SARS-CoV-2. Influenza a and B are negative.  1/13: Patient completed 3 to 5 days of remdesivir therapy in house.  No clear indication for steroids as patient has been stable no requiring oxygen.  Patient medically stable for discharge home at this time.  Ambulated without desaturation.  Will schedule for outpatient remdesivir infusion at [facility].  Discharge Diagnoses:  Principal Problem:   Acute hypoxemic respiratory failure due to COVID-19 [facility]) Active Problems:   Hypertension associated with diabetes ([place])   Cirrhosis (HCC)   Anemia in chronic kidney disease   Chronic kidney disease, stage 4 (severe) (HCC)   Type 2 diabetes mellitus ([place])   COPD with acute exacerbation (HCC)  Acute respiratory  failure with hypoxia due toCOVID-19 viral pneumonia: SARS-CoV-2 PCR positive 04/19/2019. Chest x-ray with bilateral airspace opacities.  Wean off 02 -Treated.  5 days in house.  Outpatient remdesivir infusion scheduled for 04/23/2019 through 04/23/2018 -Received 2 doses of IV steroids.  No further desaturation events noted. -Medically stable for discharge at this time    COPDwith acute exacerbation: Likely exacerbated by acute viral pneumonia.  -On room air not wheezing at time of discharge -Would not recommend steroids at this time  CKD stage IV: Appears relatively stable. Continue to monitor.  Type 2 diabetes: Not currently on medical management. Check A1c, add SSI if needed.  Hypertension: Currently stable. Continue amlodipine 10 mg daily.  Hepatic cirrhosis: Appears to be compensated, currently without encephalopathy.  However he had stated no bowel movement in 3 days Started on lactulose 10 mg twice daily, titrate to 2-3 bowel movements per day.  Resume torsemide Defer chronic lactulose to outpatient physicians  Anemiasecondary to chronic kidney diseaseand iron deficiency: Chronic and stable without obvious bleeding. Continue to monitor.  Discharge Instructions  Discharge Instructions    Diet - low sodium heart healthy   Complete by: As directed    Increase activity slowly   Complete by: As directed    MyChart COVID-19 home monitoring program   Complete by: Apr 22, 2019    Is the patient willing to use the [place] for home monitoring?: Yes   Temperature monitoring   Complete by: Apr 22, 2019    After how many days would you like to receive a notification of this patient's flowsheet entries?: 1     Allergies as of 04/22/2019   No  Known Allergies     Medication List    TAKE these medications   albuterol 108 (90 Base) MCG/ACT inhaler Commonly known as: VENTOLIN HFA Inhale 2 puffs into the lungs every 6 (six) hours as needed for  wheezing or shortness of breath.   allopurinol 100 MG tablet Commonly known as: ZYLOPRIM Take 100 mg by mouth daily.   amLODipine 10 MG tablet Commonly known as: NORVASC Take 10 mg by mouth daily.   ascorbic acid 500 MG tablet Commonly known as: VITAMIN C Take 1 tablet (500 mg total) by mouth daily. Start taking on: April 23, 2019   azithromycin 250 MG tablet Commonly known as: ZITHROMAX Take 1 tablet (250 mg total) by mouth daily for 3 days.   betamethasone dipropionate 0.05 % cream Apply topically 2 (two) times daily.   carvedilol 3.125 MG tablet Commonly known as: COREG Take 3.125 mg by mouth 2 (two) times daily.   Dulera 200-5 MCG/ACT Aero Generic drug: mometasone-formoterol Inhale 1 puff into the lungs 2 (two) times daily.   ferrous sulfate 325 (65 FE) MG tablet Take 325 mg by mouth daily.   gentamicin cream 0.1 % Commonly known as: GARAMYCIN Apply 1 application topically 2 (two) times daily.   hydrALAZINE 100 MG tablet Commonly known as: APRESOLINE Take 100 mg by mouth 3 (three) times daily.   omeprazole 40 MG capsule Commonly known as: PRILOSEC Take 40 mg by mouth every morning.   polyethylene glycol 17 g packet Commonly known as: MIRALAX / GLYCOLAX Take 17 g by mouth daily as needed for mild constipation.   sodium bicarbonate 650 MG tablet Take 1 tablet (650 mg total) by mouth 3 (three) times daily. What changed: when to take this   Spiriva Respimat 1.25 MCG/ACT Aers Generic drug: Tiotropium Bromide Monohydrate Inhale 1 spray into the lungs daily.   torsemide 10 MG tablet Commonly known as: DEMADEX Take 10 mg by mouth daily.   vitamin B-12 1000 MCG tablet Commonly known as: CYANOCOBALAMIN Take 1 tablet (1,000 mcg total) by mouth daily.   zinc sulfate 220 (50 Zn) MG capsule Take 1 capsule (220 mg total) by mouth daily. Start taking on: April 23, 2019       No Known Allergies  Consultations:  none   Procedures/Studies: DG Chest  2 View  Result Date: 04/14/2019 CLINICAL DATA:  Dyspnea on exertion for 1 week EXAM: CHEST - 2 VIEW COMPARISON:  04/01/2019 FINDINGS: Cardiac shadow is stable. Aortic calcifications are again seen. The lungs are well aerated bilaterally. No focal infiltrate or sizable effusion is seen. Chronic blunting of the right costophrenic angle is noted. Interval improved aeration in the left base is seen. IMPRESSION: Chronic blunting of the right costophrenic angle. No other focal abnormality is seen. Electronically Signed   By: Inez Catalina M.D.   On: 04/14/2019 15:22   DG Chest 2 View  Result Date: 04/01/2019 CLINICAL DATA:  Shortness of breath EXAM: CHEST - 2 VIEW COMPARISON:  January 21, 2018 FINDINGS: There is mild cardiomegaly. Hyperinflation of the lung zones. Mildly increased hazy airspace opacity seen at the left lung base. There are trace bilateral pleural effusions. Aortic knob calcifications. No acute osseous abnormality. IMPRESSION: Mildly increased hazy airspace opacity at the left lung base which could be due to atelectasis and/or early infectious etiology. Trace bilateral pleural effusions Electronically Signed   By: Prudencio Pair M.D.   On: 04/01/2019 19:05   DG Chest Portable 1 View  Result Date: 04/19/2019 CLINICAL DATA:  Shortness of breath. EXAM: PORTABLE CHEST 1 VIEW COMPARISON:  April 14, 2019 FINDINGS: There is been interval development of bilateral airspace opacities primarily in the mid lung zones bilaterally. The heart size remains enlarged. There is persistent blunting of the costophrenic angles. There is no acute osseous abnormality. Aortic calcifications are noted. The lungs appear to be somewhat hyperexpanded. IMPRESSION: 1. Interval development of bilateral airspace opacities concerning for developing multifocal pneumonia. Pulmonary edema can have a similar appearance in the appropriate clinical setting. 2. Hyperexpanded lungs, similar to prior study. 3. Persistent blunting of the  costophrenic angles which may be secondary to small bilateral pleural effusions and or atelectasis. Electronically Signed   By: Constance Holster M.D.   On: 04/19/2019 20:06    (Echo, Carotid, EGD, Colonoscopy, ERCP)    Subjective:   Discharge Exam: Vitals:   04/22/19 0722 04/22/19 0840  BP:  (!) 174/89  Pulse:  67  Resp:  18  Temp:  97.7 F (36.5 C)  SpO2: 100%    Vitals:   04/21/19 2039 04/21/19 2320 04/22/19 0722 04/22/19 0840  BP:  (!) 149/74  (!) 174/89  Pulse:    67  Resp:  18  18  Temp: 97.7 F (36.5 C) 98.7 F (37.1 C)  97.7 F (36.5 C)  TempSrc: Oral Oral  Oral  SpO2:   100%   Weight:      Height:        General: Pt is alert, awake, not in acute distress Cardiovascular: RRR, S1/S2 +, no rubs, no gallops Respiratory: CTA bilaterally, no wheezing, no rhonchi Abdominal: Soft, NT, ND, bowel sounds + Extremities: no edema, no cyanosis    The results of significant diagnostics from this hospitalization (including imaging, microbiology, ancillary and laboratory) are listed below for reference.     Microbiology: Recent Results (from the past 240 hour(s))  Respiratory Panel by RT PCR (Flu A&B, Covid) - Nasopharyngeal Swab     Status: Abnormal   Collection Time: 04/19/19  8:02 PM   Specimen: Nasopharyngeal Swab  Result Value Ref Range Status   SARS Coronavirus 2 by RT PCR POSITIVE (A) NEGATIVE Final    Comment: RESULT CALLED TO, READ BACK BY AND VERIFIED WITH: MAC BROWN 04/19/19 AT 2227 HS    Influenza A by PCR NEGATIVE NEGATIVE Final   Influenza B by PCR NEGATIVE NEGATIVE Final    Comment: (NOTE) The Xpert Xpress SARS-CoV-2/FLU/RSV assay is intended as an aid in  the diagnosis of influenza from Nasopharyngeal swab specimens and  should not be used as a sole basis for treatment. Nasal washings and  aspirates are unacceptable for Xpert Xpress SARS-CoV-2/FLU/RSV  testing. Fact Sheet for Patients: PinkCheek.be Fact Sheet for  Healthcare Providers: GravelBags.it This test is not yet approved or cleared by the [place] FDA and  has been authorized for detection and/or diagnosis of SARS-CoV-2 by  FDA under an Emergency Use Authorization (EUA). This EUA will remain  in effect (meaning this test can be used) for the duration of the  Covid-19 declaration under Section 564(b)(1) of the Act, 21  U.S.C. section 360bbb-3(b)(1), unless the authorization is  terminated or revoked. Performed at [facility], [address] [place], [place] [address]      Labs: BNP (last 3 results) Recent Labs    04/19/19 1957  BNP 458.0*   Basic Metabolic Panel: Recent Labs  Lab 04/19/19 1957 04/20/19 0300 04/21/19 0254 04/22/19 0630  NA 130* 130* 130* 129*  K 4.9 4.9 4.7  4.7  CL 102 104 104 100  CO2 18* 16* 17* 18*  GLUCOSE 138* 210* 292* 392*  BUN 60* 66* 86* 97*  CREATININE 4.50* 4.61* 4.42* 4.66*  CALCIUM 7.7* 7.5* 7.7* 7.5*  MG  --  2.2 2.3 2.2  PHOS  --  5.7* 6.9* 5.9*   Liver Function Tests: Recent Labs  Lab 04/19/19 1957 04/20/19 0300 04/21/19 0254 04/22/19 0630  AST 57* 44* 34 39  ALT 37 33 30 36  ALKPHOS 103 93 84 82  BILITOT 0.7 0.6 0.4 0.5  PROT 7.1 6.4* 6.5 6.6  ALBUMIN 2.6* 2.2* 2.3* 2.3*   No results for input(s): LIPASE, AMYLASE in the last 168 hours. No results for input(s): AMMONIA in the last 168 hours. CBC: Recent Labs  Lab 04/19/19 1957 04/20/19 0300 04/21/19 0254 04/22/19 0630  WBC 9.4 4.0 5.0 5.4  NEUTROABS 6.7 3.8 4.3 5.0  HGB 8.9* 8.3* 8.4* 8.1*  HCT 27.1* 25.4* 25.4* 24.5*  MCV 91.2 91.0 89.1 88.4  PLT 183 148* 194 189   Cardiac Enzymes: No results for input(s): CKTOTAL, CKMB, CKMBINDEX, TROPONINI in the last 168 hours. BNP: Invalid input(s): POCBNP CBG: No results for input(s): GLUCAP in the last 168 hours. D-Dimer No results for input(s): DDIMER in the last 72 hours. Hgb A1c Recent Labs    04/20/19 0300  HGBA1C 5.1    Lipid Profile Recent Labs    04/19/19 1957  TRIG 194*   Thyroid function studies No results for input(s): TSH, T4TOTAL, T3FREE, THYROIDAB in the last 72 hours.  Invalid input(s): [name]3 Anemia work up Recent Labs    04/21/19 0254 04/22/19 0630  FERRITIN 284 317   Urinalysis    Component Value Date/Time   COLORURINE YELLOW (A) 01/23/2018 1051   APPEARANCEUR CLEAR (A) 01/23/2018 1051   APPEARANCEUR Clear 09/08/2013 0158   LABSPEC 1.013 01/23/2018 1051   LABSPEC 1.027 09/08/2013 0158   PHURINE 5.0 01/23/2018 1051   GLUCOSEU 50 (A) 01/23/2018 1051   GLUCOSEU >=500 09/08/2013 0158   HGBUR NEGATIVE 01/23/2018 1051   BILIRUBINUR NEGATIVE 01/23/2018 1051   BILIRUBINUR Negative 09/08/2013 0158   KETONESUR NEGATIVE 01/23/2018 1051   PROTEINUR 100 (A) 01/23/2018 1051   UROBILINOGEN 2.0 (H) 07/03/2011 1333   NITRITE NEGATIVE 01/23/2018 1051   LEUKOCYTESUR NEGATIVE 01/23/2018 1051   LEUKOCYTESUR 2+ 09/08/2013 0158   Sepsis Labs Invalid input(s): PROCALCITONIN,  WBC,  LACTICIDVEN Microbiology Recent Results (from the past 240 hour(s))  Respiratory Panel by RT PCR (Flu A&B, Covid) - Nasopharyngeal Swab     Status: Abnormal   Collection Time: 04/19/19  8:02 PM   Specimen: Nasopharyngeal Swab  Result Value Ref Range Status   SARS Coronavirus 2 by RT PCR POSITIVE (A) NEGATIVE Final    Comment: RESULT CALLED TO, READ BACK BY AND VERIFIED WITH: MAC BROWN 04/19/19 AT 2227 HS    Influenza A by PCR NEGATIVE NEGATIVE Final   Influenza B by PCR NEGATIVE NEGATIVE Final    Comment: (NOTE) The Xpert Xpress SARS-CoV-2/FLU/RSV assay is intended as an aid in  the diagnosis of influenza from Nasopharyngeal swab specimens and  should not be used as a sole basis for treatment. Nasal washings and  aspirates are unacceptable for Xpert Xpress SARS-CoV-2/FLU/RSV  testing. Fact Sheet for Patients: PinkCheek.be Fact Sheet for Healthcare  Providers: GravelBags.it This test is not yet approved or cleared by the [place] FDA and  has been authorized for detection and/or diagnosis of SARS-CoV-2 by  FDA under an Emergency  Use Authorization (EUA). This EUA will remain  in effect (meaning this test can be used) for the duration of the  Covid-19 declaration under Section 564(b)(1) of the Act, 21  U.S.C. section 360bbb-3(b)(1), unless the authorization is  terminated or revoked. Performed at [facility], [address] [place], [place] [address]      Time coordinating discharge: Over 30 minutes  SIGNED:   Sidney Ace, MD  [facility] 04/22/2019, 2:17 PM Pager   If 7PM-7AM, please contact night-coverage [email or web address] Password [email or web address]

## 2019-04-22 NOTE — TOC Transition Note (Signed)
Transition of Care [facility]) - CM/SW Discharge Note   Patient Details  Name: [name] MRN: [id number] Date of Birth: 04/17/1950  Transition of Care [facility]) CM/SW Contact:  Shelbie Hutching, RN Phone Number: 04/22/2019, 10:16 AM   Clinical Narrative:    Patient has been medically cleared for discharge and will discharge home today.  Patient's brother in law [name] will bring the patient some clothes to wear home and provide his transportation.  Patient will have home health services with [facility] for RN and PT.  [name] with [facility] is aware of discharge today.    Final next level of care: [place] Barriers to Discharge: Barriers Resolved   Patient Goals and CMS Choice Patient states their goals for this hospitalization and ongoing recovery are:: To get well and get my health back CMS Medicare.gov Compare Post Acute Care list provided to:: Patient Choice offered to / list presented to : Patient  Discharge Placement                       Discharge Plan and Services   Discharge Planning Services: CM Consult Post Acute Care Choice: Home Health                    HH Arranged: RN, PT [facility] Agency: [facility] Date [facility] Agency Contacted: 04/22/19 Time [place]: [place] Representative spoke with at [place]: [place] ([place]) Interventions     Readmission Risk Interventions No flowsheet data found.

## 2019-04-22 NOTE — Progress Notes (Signed)
Pt being discharged home with home health, discharge instructions reviewed with pt, states understanding, discharge covid info reviewed and given to pt, states understanding, pt with no complaints, awaiting ride

## 2019-04-22 NOTE — Progress Notes (Signed)
Daily update given to family

## 2019-04-22 NOTE — Discharge Instructions (Signed)
You are scheduled for an outpatient infusion of Remdesivir at 1000AM on Thursday 1/14 and Friday 1/15.[name]  Please report to [facility] at [address].  Drive to the security guard and tell them you are here for an infusion. They will direct you to the front entrance where we will come and get you.  For questions call [phone].  Thanks

## 2019-04-22 NOTE — Progress Notes (Signed)
Patient scheduled for outpatient Remdesivir infusion at 1000AM on Thursday 1/14 and Friday 1/15. Please advise them to report to [facility] at [address]  Drive to the security guard and tell them you are here for an infusion. They will direct you to the front entrance where we will come and get you.  For questions call [phone].  Thanks

## 2019-04-23 ENCOUNTER — Ambulatory Visit (HOSPITAL_COMMUNITY)
Admit: 2019-04-23 | Discharge: 2019-04-23 | Disposition: A | Discharge status: Home / Self Care | Payer: Medicare Other | Attending: Pulmonary Disease | Admitting: Pulmonary Disease

## 2019-04-23 DIAGNOSIS — U071 COVID-19: Secondary | ICD-10-CM

## 2019-04-23 DIAGNOSIS — J9601 Acute respiratory failure with hypoxia: Secondary | ICD-10-CM

## 2019-04-24 ENCOUNTER — Ambulatory Visit (HOSPITAL_COMMUNITY): Admit: 2019-04-24 | Payer: Medicare Other

## 2019-04-28 ENCOUNTER — Encounter: Payer: Self-pay | Admitting: *Deleted

## 2019-04-28 ENCOUNTER — Other Ambulatory Visit: Payer: Self-pay

## 2019-04-28 ENCOUNTER — Inpatient Hospital Stay
Admission: EM | Admit: 2019-04-28 | Discharge: 2019-05-03 | DRG: 309 | Disposition: A | Discharge status: Home Health | Payer: Medicare Other | Attending: Internal Medicine | Admitting: Internal Medicine

## 2019-04-28 ENCOUNTER — Emergency Department: Payer: Medicare Other

## 2019-04-28 DIAGNOSIS — E1159 Type 2 diabetes mellitus with other circulatory complications: Secondary | ICD-10-CM | POA: Diagnosis present

## 2019-04-28 DIAGNOSIS — Z85828 Personal history of other malignant neoplasm of skin: Secondary | ICD-10-CM | POA: Diagnosis not present

## 2019-04-28 DIAGNOSIS — I4892 Unspecified atrial flutter: Principal | ICD-10-CM

## 2019-04-28 DIAGNOSIS — K7460 Unspecified cirrhosis of liver: Secondary | ICD-10-CM | POA: Diagnosis present

## 2019-04-28 DIAGNOSIS — J441 Chronic obstructive pulmonary disease with (acute) exacerbation: Secondary | ICD-10-CM

## 2019-04-28 DIAGNOSIS — Z8616 Personal history of COVID-19: Secondary | ICD-10-CM

## 2019-04-28 DIAGNOSIS — E1122 Type 2 diabetes mellitus with diabetic chronic kidney disease: Secondary | ICD-10-CM | POA: Diagnosis present

## 2019-04-28 DIAGNOSIS — I152 Hypertension secondary to endocrine disorders: Secondary | ICD-10-CM | POA: Diagnosis present

## 2019-04-28 DIAGNOSIS — I10 Essential (primary) hypertension: Secondary | ICD-10-CM | POA: Diagnosis not present

## 2019-04-28 DIAGNOSIS — D631 Anemia in chronic kidney disease: Secondary | ICD-10-CM | POA: Diagnosis present

## 2019-04-28 DIAGNOSIS — E785 Hyperlipidemia, unspecified: Secondary | ICD-10-CM | POA: Diagnosis present

## 2019-04-28 DIAGNOSIS — R5381 Other malaise: Secondary | ICD-10-CM | POA: Diagnosis not present

## 2019-04-28 DIAGNOSIS — R5383 Other fatigue: Secondary | ICD-10-CM

## 2019-04-28 DIAGNOSIS — Z825 Family history of asthma and other chronic lower respiratory diseases: Secondary | ICD-10-CM | POA: Diagnosis not present

## 2019-04-28 DIAGNOSIS — E1165 Type 2 diabetes mellitus with hyperglycemia: Secondary | ICD-10-CM | POA: Diagnosis present

## 2019-04-28 DIAGNOSIS — J9601 Acute respiratory failure with hypoxia: Secondary | ICD-10-CM | POA: Diagnosis not present

## 2019-04-28 DIAGNOSIS — N184 Chronic kidney disease, stage 4 (severe): Secondary | ICD-10-CM | POA: Diagnosis present

## 2019-04-28 DIAGNOSIS — I509 Heart failure, unspecified: Secondary | ICD-10-CM | POA: Diagnosis present

## 2019-04-28 DIAGNOSIS — D509 Iron deficiency anemia, unspecified: Secondary | ICD-10-CM | POA: Diagnosis present

## 2019-04-28 DIAGNOSIS — K219 Gastro-esophageal reflux disease without esophagitis: Secondary | ICD-10-CM | POA: Diagnosis present

## 2019-04-28 DIAGNOSIS — I444 Left anterior fascicular block: Secondary | ICD-10-CM | POA: Diagnosis present

## 2019-04-28 DIAGNOSIS — I441 Atrioventricular block, second degree: Secondary | ICD-10-CM | POA: Diagnosis present

## 2019-04-28 DIAGNOSIS — I130 Hypertensive heart and chronic kidney disease with heart failure and stage 1 through stage 4 chronic kidney disease, or unspecified chronic kidney disease: Secondary | ICD-10-CM | POA: Diagnosis present

## 2019-04-28 DIAGNOSIS — U071 COVID-19: Secondary | ICD-10-CM

## 2019-04-28 DIAGNOSIS — J1282 Pneumonia due to coronavirus disease 2019: Secondary | ICD-10-CM

## 2019-04-28 DIAGNOSIS — F17210 Nicotine dependence, cigarettes, uncomplicated: Secondary | ICD-10-CM | POA: Diagnosis present

## 2019-04-28 DIAGNOSIS — Z79899 Other long term (current) drug therapy: Secondary | ICD-10-CM

## 2019-04-28 DIAGNOSIS — D472 Monoclonal gammopathy: Secondary | ICD-10-CM | POA: Diagnosis present

## 2019-04-28 LAB — BASIC METABOLIC PANEL
Anion gap: 11 (ref 5–15)
BUN: 53 mg/dL — ABNORMAL HIGH (ref 8–23)
CO2: 19 mmol/L — ABNORMAL LOW (ref 22–32)
Calcium: 7.7 mg/dL — ABNORMAL LOW (ref 8.9–10.3)
Chloride: 100 mmol/L (ref 98–111)
Creatinine, Ser: 3.7 mg/dL — ABNORMAL HIGH (ref 0.61–1.24)
GFR calc Af Amer: 19 mL/min — ABNORMAL LOW (ref >60)
GFR calc non Af Amer: 16 mL/min — ABNORMAL LOW (ref >60)
Glucose, Bld: 134 mg/dL — ABNORMAL HIGH (ref 70–99)
Potassium: 4.4 mmol/L (ref 3.5–5.1)
Sodium: 130 mmol/L — ABNORMAL LOW (ref 135–145)

## 2019-04-28 LAB — CBC
HCT: 23.7 % — ABNORMAL LOW (ref 39.0–52.0)
Hemoglobin: 7.7 g/dL — ABNORMAL LOW (ref 13.0–17.0)
MCH: 29.1 pg (ref 26.0–34.0)
MCHC: 32.5 g/dL (ref 30.0–36.0)
MCV: 89.4 fL (ref 80.0–100.0)
Platelets: 126 10*3/uL — ABNORMAL LOW (ref 150–400)
RBC: 2.65 MIL/uL — ABNORMAL LOW (ref 4.22–5.81)
RDW: 13.7 % (ref 11.5–15.5)
WBC: 13.7 10*3/uL — ABNORMAL HIGH (ref 4.0–10.5)
nRBC: 0 % (ref 0.0–0.2)

## 2019-04-28 LAB — MAGNESIUM: Magnesium: 1.4 mg/dL — ABNORMAL LOW (ref 1.7–2.4)

## 2019-04-28 LAB — TROPONIN I (HIGH SENSITIVITY): Troponin I (High Sensitivity): 24 ng/L — ABNORMAL HIGH (ref <18)

## 2019-04-28 MED ORDER — MAGNESIUM SULFATE 2 GM/50ML IV SOLN
2.0000 g | Freq: Once | INTRAVENOUS | Status: AC
  Administered 2019-04-28: 2 g via INTRAVENOUS
  Filled 2019-04-28: qty 50

## 2019-04-28 MED ORDER — METOPROLOL TARTRATE 25 MG PO TABS
25.0000 mg | ORAL_TABLET | Freq: Once | ORAL | Status: AC
  Administered 2019-04-29: 25 mg via ORAL
  Filled 2019-04-28: qty 1

## 2019-04-28 MED ORDER — METOPROLOL TARTRATE 5 MG/5ML IV SOLN
5.0000 mg | INTRAVENOUS | Status: DC | PRN
  Administered 2019-04-28: 5 mg via INTRAVENOUS
  Filled 2019-04-28: qty 5

## 2019-04-28 NOTE — ED Triage Notes (Addendum)
Pt tested positive for COVID on Jan. 10th and has been calling EMS twice over the past 2 days and decided not to be transported to the ED. Tonight pt stated he "could not take it anymore." Pt reporting generalized body aches.   Vitals signs within normal limits. Pt is alert and oriented upon arrival. 99.9 temp per EMS.  Pt reporting increased fatigue and decreased energy. Decreased PO increased and reports decreased appetite but no NVD.

## 2019-04-28 NOTE — ED Notes (Signed)
Patient educated on need for urine sample. Provided patient with urinal and patient states he will attempt to provide sample soon.

## 2019-04-28 NOTE — ED Provider Notes (Signed)
[facility] Emergency Department Provider Note   ____________________________________________   First MD Initiated Contact with Patient 04/28/19 2203     (approximate)  I have reviewed the triage vital signs and the nursing notes.   HISTORY  Chief Complaint COVID    HPI [name] is a 69 y.o. male with past medical history of COPD, CKD, CHF, cirrhosis, and hypertension who presents to the ED for malaise and generalized body aches.  Patient was recently admitted to the hospital after testing positive for COVID-19 on January 10.  He states that ever since he got home he has been feeling very bad and having a hard time getting up out of bed.  He describes diffuse body aches but denies any pain in his chest or difficulty breathing.  He has not had any fever since he was discharged and denies any significant cough.  He does state that he has had a decreased appetite with poor p.o. intake.        Past Medical History:  Diagnosis Date  . Anemia   . Cancer ([place]) 11/02/2014   PERIANAL MASS, RIGHT; INCISIONAL BIOPSY: INVASIVE CARCINOMA WITH BASALOID FEATURES  . CHF (congestive heart failure) ([place])   . Chronic kidney disease   . COPD (chronic obstructive pulmonary disease) ([place])    NO INHALERS  . Diabetes mellitus    NO MEDS  . GERD (gastroesophageal reflux disease)   . Hepatic cirrhosis ([place])   . Hypertension   . Leg swelling   . Loose, teeth    PATIENT STATES "HAS 6 TEETH, SOME ARE LOOSE"  . Neuromuscular disorder ([place])    RIGHT HAND AND FINGERS NUMB  . Shortness of breath dyspnea    WITH EXERTION   . 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 Acute hypoxemic respiratory failure due to COVID-19 ([place]) 04/19/2019  . Type 2 diabetes mellitus ([place]) 04/19/2019  . COPD with acute exacerbation ([place]) 04/19/2019  . Chronic kidney disease, stage 4 (severe) ([place]) 01/29/2019  .  Hyperparathyroidism due to renal insufficiency ([place]) 01/29/2019  . Proteinuria 01/29/2019  . Sebaceous cyst 09/24/2018  . MGUS (monoclonal gammopathy of unknown significance) 03/27/2018  . Anemia in chronic kidney disease 03/27/2018  . Rotator cuff arthropathy of right shoulder 02/05/2018  . ARF (acute renal failure) ([place]) 01/21/2018  . Mass of perianal area 10/16/2017  . Mass of pharynx 11/09/2016  . Polyp of cecum 11/09/2016  . Chronic hepatitis C without hepatic coma ([place]) 11/09/2016  . Cirrhosis ([place]) 11/09/2016  . Iron deficiency anemia 09/19/2016  . Chest pain with high risk for cardiac etiology 08/13/2016  . SOB (shortness of breath) on exertion 08/13/2016  . Varicose veins of both lower extremities with inflammation 06/11/2016  . Hypertension associated with diabetes ([place]) 06/11/2016  . Chronic venous insufficiency 05/08/2016  . Diabetic foot ulcer with osteomyelitis ([place]) 05/08/2016  . Lymphedema 04/12/2016  . Ulcer of great toe ([place]) 04/12/2016  . 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 Anemia 01/26/2016  . Skin mass   . Lymphadenopathy, inguinal   . Symptomatic anemia 01/24/2016  . Hypoglycemia 09/13/2015  . Internal hemorrhoids 12/30/2014  . Mass of anus 11/01/2014    Past Surgical History:  Procedure Laterality Date  . APPENDECTOMY    . COLONOSCOPY WITH PROPOFOL N/A 11/19/2014   Procedure: COLONOSCOPY WITH PROPOFOL;  Surgeon: Lucilla Lame, MD;  Location: [place];  Service: Endoscopy;  Laterality: N/A;  DIABETIC-ORAL MEDS  . COLONOSCOPY WITH PROPOFOL N/A 10/04/2016   Procedure: COLONOSCOPY WITH PROPOFOL;  Surgeon: Jonathon Bellows, MD;  Location: [facility] ENDOSCOPY;  Service: Endoscopy;  Laterality: N/A;  . ESOPHAGOGASTRODUODENOSCOPY (EGD) WITH PROPOFOL N/A 10/04/2016   Procedure: ESOPHAGOGASTRODUODENOSCOPY (EGD) WITH PROPOFOL;  Surgeon: Jonathon Bellows, MD;  Location: [facility] ENDOSCOPY;  Service: Endoscopy;  Laterality: N/A;  . GIVENS CAPSULE STUDY N/A  11/13/2016   Procedure: GIVENS CAPSULE STUDY;  Surgeon: Jonathon Bellows, MD;  Location: [facility] ENDOSCOPY;  Service: Gastroenterology;  Laterality: N/A;  . INCISION AND DRAINAGE PERIRECTAL ABSCESS Right 12/16/2017   Procedure: EXCISION BASAL CELL CANCER/ GLUTEAL ABSCESS AND FLAP COVERAGE;  Surgeon: Robert Bellow, MD;  Location: [facility] ORS;  Service: General;  Laterality: Right;  . INGUINAL LYMPH NODE BIOPSY Right 01/26/2016   Procedure: INGUINAL LYMPH NODE BIOPSY;  Surgeon: Clayburn Pert, MD;  Location: [facility] ORS;  Service: General;  Laterality: Right;  . RECTAL BIOPSY N/A 11/02/2014   Procedure: BIOPSY RECTAL;  Surgeon: Marlyce Huge, MD;  Location: [facility] ORS;  Service: General;  Laterality: N/A;  . RECTAL EXAM UNDER ANESTHESIA N/A 11/02/2014   Procedure: RECTAL EXAM UNDER ANESTHESIA;  Surgeon: Marlyce Huge, MD;  Location: [facility] ORS;  Service: General;  Laterality: N/A;  . TONSILLECTOMY      Prior to Admission medications   Medication Sig Start Date End Date Taking? Authorizing Provider  albuterol (VENTOLIN HFA) 108 (90 Base) MCG/ACT inhaler Inhale 2 puffs into the lungs every 6 (six) hours as needed for wheezing or shortness of breath.    [provider]  allopurinol (ZYLOPRIM) 100 MG tablet Take 100 mg by mouth daily. 03/20/19   [provider]  amLODipine (NORVASC) 10 MG tablet Take 10 mg by mouth daily.     [provider]  ascorbic acid (VITAMIN C) 500 MG tablet Take 1 tablet (500 mg total) by mouth daily. 04/23/19 05/23/19  Ralene Muskrat B, MD  betamethasone dipropionate 0.05 % cream Apply topically 2 (two) times daily. 02/24/19   Edrick Kins, DPM  carvedilol (COREG) 3.125 MG tablet Take 3.125 mg by mouth 2 (two) times daily. 03/31/19   [provider]  DULERA 200-5 MCG/ACT AERO Inhale 1 puff into the lungs 2 (two) times daily. 04/09/19   [provider]  ferrous sulfate 325 (65 FE) MG tablet Take 325 mg by mouth daily.      [provider]  gentamicin cream (GARAMYCIN) 0.1 % Apply 1 application topically 2 (two) times daily. 02/24/19   Edrick Kins, DPM  hydrALAZINE (APRESOLINE) 100 MG tablet Take 100 mg by mouth 3 (three) times daily.     [provider]  omeprazole (PRILOSEC) 40 MG capsule Take 40 mg by mouth every morning.     [provider]  polyethylene glycol (MIRALAX / GLYCOLAX) packet Take 17 g by mouth daily as needed for mild constipation.     [provider]  sodium bicarbonate 650 MG tablet Take 1 tablet (650 mg total) by mouth 3 (three) times daily. Patient taking differently: Take 650 mg by mouth 2 (two) times daily.  01/24/18   Vaughan Basta, MD  SPIRIVA RESPIMAT 1.25 MCG/ACT AERS Inhale 1 spray into the lungs daily. 04/09/19   [provider]  torsemide (DEMADEX) 10 MG tablet Take 10 mg by mouth daily.     [provider]  vitamin B-12 (CYANOCOBALAMIN) 1000 MCG tablet Take 1 tablet (1,000 mcg total) by mouth daily. 06/03/17  Sindy Guadeloupe, MD  zinc sulfate 220 (50 Zn) MG capsule Take 1 capsule (220 mg total) by mouth daily. 04/23/19 05/23/19  Sidney Ace, MD    Allergies Patient has no known allergies.  Family History  Problem Relation Age of Onset  . Asthma Mother   . Colon cancer Neg Hx     Social History Social History   Tobacco Use  . Smoking status: Former Smoker    Packs/day: 0.25    Years: 20.00    Pack years: 5.00    Types: Cigarettes  . Smokeless tobacco: Never Used  . Tobacco comment: 3-4 cigarettes per day  Substance Use Topics  . Alcohol use: Not Currently  . Drug use: Not Currently    Comment: Last Use 2016 per patient-+ UDS FOR COCAINE IN 2014    Review of Systems  Constitutional: No fever/chills.  Positive for malaise and generalized weakness. Eyes: No visual changes. ENT: No sore throat. Cardiovascular: Denies chest pain. Respiratory: Denies shortness of breath. Gastrointestinal: No  abdominal pain.  No nausea, no vomiting.  No diarrhea.  No constipation. Genitourinary: Negative for dysuria. Musculoskeletal: Negative for back pain.  Positive for myalgias. Skin: Negative for rash. Neurological: Negative for headaches, focal weakness or numbness.  ____________________________________________   PHYSICAL EXAM:  VITAL SIGNS: ED Triage Vitals [04/28/19 2109]  Enc Vitals Group     BP 136/73     Pulse Rate 76     Resp 16     Temp 99.1 F (37.3 C)     Temp Source Oral     SpO2 97 %     Weight 185 lb (83.9 kg)     Height 5\' 10"  (1.778 m)     Head Circumference      Peak Flow      Pain Score 0     Pain Loc      Pain Edu?      Excl. in [place]?     Constitutional: Alert and oriented. Eyes: Conjunctivae are normal. Head: Atraumatic. Nose: No congestion/rhinnorhea. Mouth/Throat: Mucous membranes are moist. Neck: Normal ROM Cardiovascular: Tachycardic, irregularly irregular rhythm. Grossly normal heart sounds. Respiratory: Normal respiratory effort.  No retractions.  Mild expiratory wheezing. Gastrointestinal: Soft and nontender. No distention. Genitourinary: deferred Musculoskeletal: No lower extremity tenderness nor edema. Neurologic:  Normal speech and language. No gross focal neurologic deficits are appreciated. Skin:  Skin is warm, dry and intact. No rash noted. Psychiatric: Mood and affect are normal. Speech and behavior are normal.  ____________________________________________   LABS (all labs ordered are listed, but only abnormal results are displayed)  Labs Reviewed  BASIC METABOLIC PANEL - Abnormal; Notable for the following components:      Result Value   Sodium 130 (*)    CO2 19 (*)    Glucose, Bld 134 (*)    BUN 53 (*)    Creatinine, Ser 3.70 (*)    Calcium 7.7 (*)    GFR calc non Af Amer 16 (*)    GFR calc Af Amer 19 (*)    All other components within normal limits  CBC - Abnormal; Notable for the following components:   WBC 13.7 (*)     RBC 2.65 (*)    Hemoglobin 7.7 (*)    HCT 23.7 (*)    Platelets 126 (*)    All other components within normal limits  MAGNESIUM - Abnormal; Notable for the following components:   Magnesium 1.4 (*)    All other components  within normal limits  TROPONIN I (HIGH SENSITIVITY) - Abnormal; Notable for the following components:   Troponin I (High Sensitivity) 24 (*)    All other components within normal limits  URINALYSIS, COMPLETE (UACMP) WITH MICROSCOPIC  TSH   ____________________________________________  EKG  ED ECG REPORT I, Blake Divine, the attending physician, personally viewed and interpreted this ECG.   Date: 04/28/2019  EKG Time: 21:14  Rate: 81  Rhythm: normal sinus rhythm  Axis: LAD  Intervals:left anterior fascicular block  ST&T Change: None  ED ECG REPORT I, Blake Divine, the attending physician, personally viewed and interpreted this ECG.   Date: 04/28/2019  EKG Time: 22:57  Rate: 126  Rhythm: atrial flutter, rate 126  Axis: Normal  Intervals:left anterior fascicular block and Prolonged QT  ST&T Change: None   PROCEDURES  Procedure(s) performed (including Critical Care):  Procedures   ____________________________________________   INITIAL IMPRESSION / ASSESSMENT AND PLAN / ED COURSE       69 year old male presents to the ED following recent admission for COVID-19, where he tested positive on January 10.  He now complains of ongoing fatigue, malaise, and diffuse body aches, but does not appear to have any significant respiratory symptoms and is maintaining O2 sats on room air.  While being positioned to recheck O2 sats in a standing position, patient noted to become suddenly quite tachycardic.  This appears most consistent with atrial flutter and patient noted to have significant variation in rate.  Blood pressure remained stable and he was given an IV dose of metoprolol with improvement in heart rate to the 80s.  Will give follow-up p.o. dose of  metoprolol, although patient does not appear to have a history of atrial flutter or atrial fibrillation.  Lab work significant for hypomagnesemia, will replete magnesium.  Troponin appears stable and chest x-ray improved from prior.  Case was discussed with hospitalist, who accepts patient for admission and will determine need for anticoagulation.      ____________________________________________   FINAL CLINICAL IMPRESSION(S) / ED DIAGNOSES  Final diagnoses:  Atrial flutter, unspecified type ([place])  Pneumonia due to COVID-19 virus  Malaise and fatigue  Hypomagnesemia     ED Discharge Orders    None       Note:  This document was prepared using Dragon voice recognition software and may include unintentional dictation errors.   Blake Divine, MD 04/28/19 [phone]

## 2019-04-28 NOTE — ED Notes (Signed)
Patient was stood up to check oxygen sats on standing. Patient remained 98% on RA. Patient when retuning to bed experienced HR of 123, patient states he did not feel any different than prior to occurrence. Patient denies SOB. Provider notified, will continue to monitor

## 2019-04-29 ENCOUNTER — Ambulatory Visit: Payer: Medicare Other | Attending: Radiation Oncology | Admitting: Radiation Oncology

## 2019-04-29 DIAGNOSIS — D631 Anemia in chronic kidney disease: Secondary | ICD-10-CM

## 2019-04-29 DIAGNOSIS — N184 Chronic kidney disease, stage 4 (severe): Secondary | ICD-10-CM

## 2019-04-29 LAB — FERRITIN
Ferritin: 297 ng/mL (ref 24–336)
Ferritin: 448 ng/mL — ABNORMAL HIGH (ref 24–336)

## 2019-04-29 LAB — BASIC METABOLIC PANEL
Anion gap: 9 (ref 5–15)
BUN: 53 mg/dL — ABNORMAL HIGH (ref 8–23)
CO2: 20 mmol/L — ABNORMAL LOW (ref 22–32)
Calcium: 7.3 mg/dL — ABNORMAL LOW (ref 8.9–10.3)
Chloride: 103 mmol/L (ref 98–111)
Creatinine, Ser: 3.95 mg/dL — ABNORMAL HIGH (ref 0.61–1.24)
GFR calc Af Amer: 17 mL/min — ABNORMAL LOW (ref >60)
GFR calc non Af Amer: 15 mL/min — ABNORMAL LOW (ref >60)
Glucose, Bld: 143 mg/dL — ABNORMAL HIGH (ref 70–99)
Potassium: 4.4 mmol/L (ref 3.5–5.1)
Sodium: 132 mmol/L — ABNORMAL LOW (ref 135–145)

## 2019-04-29 LAB — CBC
HCT: 21.3 % — ABNORMAL LOW (ref 39.0–52.0)
Hemoglobin: 6.8 g/dL — ABNORMAL LOW (ref 13.0–17.0)
MCH: 29.2 pg (ref 26.0–34.0)
MCHC: 31.9 g/dL (ref 30.0–36.0)
MCV: 91.4 fL (ref 80.0–100.0)
Platelets: 102 10*3/uL — ABNORMAL LOW (ref 150–400)
RBC: 2.33 MIL/uL — ABNORMAL LOW (ref 4.22–5.81)
RDW: 13.9 % (ref 11.5–15.5)
WBC: 10.5 10*3/uL (ref 4.0–10.5)
nRBC: 0 % (ref 0.0–0.2)

## 2019-04-29 LAB — URINALYSIS, COMPLETE (UACMP) WITH MICROSCOPIC
Bacteria, UA: NONE SEEN
Bilirubin Urine: NEGATIVE
Glucose, UA: 50 mg/dL — AB
Hgb urine dipstick: NEGATIVE
Ketones, ur: NEGATIVE mg/dL
Leukocytes,Ua: NEGATIVE
Nitrite: NEGATIVE
Protein, ur: >=300 mg/dL — AB
Specific Gravity, Urine: 1.013 (ref 1.005–1.030)
pH: 5 (ref 5.0–8.0)

## 2019-04-29 LAB — IRON AND TIBC
Iron: 24 ug/dL — ABNORMAL LOW (ref 45–182)
Saturation Ratios: 14 % — ABNORMAL LOW (ref 17.9–39.5)
TIBC: 168 ug/dL — ABNORMAL LOW (ref 250–450)
UIBC: 144 ug/dL

## 2019-04-29 LAB — C-REACTIVE PROTEIN: CRP: 3 mg/dL — ABNORMAL HIGH (ref <1.0)

## 2019-04-29 LAB — PREPARE RBC (CROSSMATCH)

## 2019-04-29 LAB — LIPID PANEL
Cholesterol: 124 mg/dL (ref 0–200)
HDL: 22 mg/dL — ABNORMAL LOW (ref >40)
LDL Cholesterol: 73 mg/dL (ref 0–99)
Total CHOL/HDL Ratio: 5.6 RATIO
Triglycerides: 145 mg/dL (ref <150)
VLDL: 29 mg/dL (ref 0–40)

## 2019-04-29 LAB — TSH: TSH: 2.82 u[IU]/mL (ref 0.350–4.500)

## 2019-04-29 LAB — LACTATE DEHYDROGENASE: LDH: 144 U/L (ref 98–192)

## 2019-04-29 LAB — VITAMIN B12: Vitamin B-12: 448 pg/mL (ref 180–914)

## 2019-04-29 LAB — FIBRIN DERIVATIVES D-DIMER (ARMC ONLY): Fibrin derivatives D-dimer (ARMC): 1935.02 ng/mL (FEU) — ABNORMAL HIGH (ref 0.00–499.00)

## 2019-04-29 MED ORDER — ACETAMINOPHEN 325 MG PO TABS: 650.0000 mg | ORAL_TABLET | ORAL | Status: DC | PRN

## 2019-04-29 MED ORDER — ZOLPIDEM TARTRATE 5 MG PO TABS: 5.0000 mg | ORAL_TABLET | Freq: Every evening | ORAL | Status: DC | PRN

## 2019-04-29 MED ORDER — ALBUTEROL SULFATE HFA 108 (90 BASE) MCG/ACT IN AERS
2.0000 | INHALATION_SPRAY | Freq: Four times a day (QID) | RESPIRATORY_TRACT | Status: DC
  Administered 2019-04-29 – 2019-05-03 (×16): 2 via RESPIRATORY_TRACT
  Filled 2019-04-29: qty 6.7

## 2019-04-29 MED ORDER — HEPARIN SODIUM (PORCINE) 5000 UNIT/ML IJ SOLN: 5000.0000 [IU] | Freq: Two times a day (BID) | INTRAMUSCULAR | Status: DC

## 2019-04-29 MED ORDER — ONDANSETRON HCL 4 MG/2ML IJ SOLN: 4.0000 mg | Freq: Four times a day (QID) | INTRAMUSCULAR | Status: DC | PRN

## 2019-04-29 MED ORDER — SODIUM CHLORIDE 0.9 % IV SOLN: INTRAVENOUS | Status: DC

## 2019-04-29 MED ORDER — ALPRAZOLAM 0.25 MG PO TABS: 0.2500 mg | ORAL_TABLET | Freq: Two times a day (BID) | ORAL | Status: DC | PRN

## 2019-04-29 MED ORDER — HYDRALAZINE HCL 25 MG PO TABS
25.0000 mg | ORAL_TABLET | Freq: Three times a day (TID) | ORAL | Status: DC
  Administered 2019-04-29 – 2019-05-01 (×7): 25 mg via ORAL
  Filled 2019-04-29 (×6): qty 1

## 2019-04-29 MED ORDER — DILTIAZEM HCL 30 MG PO TABS
30.0000 mg | ORAL_TABLET | Freq: Four times a day (QID) | ORAL | Status: DC
  Administered 2019-04-29 – 2019-05-02 (×11): 30 mg via ORAL
  Filled 2019-04-29 (×12): qty 1

## 2019-04-29 MED ORDER — APIXABAN 5 MG PO TABS
5.0000 mg | ORAL_TABLET | Freq: Two times a day (BID) | ORAL | Status: DC
  Administered 2019-04-29 (×2): 5 mg via ORAL
  Filled 2019-04-29 (×2): qty 1

## 2019-04-29 MED ORDER — SODIUM CHLORIDE 0.9% IV SOLUTION: Freq: Once | INTRAVENOUS | Status: AC

## 2019-04-29 MED ORDER — DEXAMETHASONE SODIUM PHOSPHATE 10 MG/ML IJ SOLN
6.0000 mg | Freq: Every day | INTRAMUSCULAR | Status: DC
  Administered 2019-04-29 – 2019-04-30 (×3): 6 mg via INTRAVENOUS
  Filled 2019-04-29 (×2): qty 1

## 2019-04-29 MED ORDER — AMLODIPINE BESYLATE 5 MG PO TABS: 5.0000 mg | ORAL_TABLET | Freq: Every day | ORAL | Status: DC

## 2019-04-29 NOTE — Progress Notes (Signed)
PROGRESS NOTE                                                                                                                                                                                                             Patient Demographics:    [name], is a 69 y.o. male, DOB - 1950-12-22, YKD:[id number]  Admit date - 04/28/2019   Admitting Physician Christel Mormon, MD  Outpatient Primary MD for the patient is [name], MD  LOS - 1  Outpatient Specialists: None  Chief Complaint  Patient presents with  . COVID       Brief Narrative   69 year old male with history of COPD, not on O2, type 2 diabetes mellitus, CKD stage IV, cirrhosis, IgA MGUS, anemia of chronic disease/iron deficiency, hypertension presented to the ED with generalized body ache and malaise.  He was hospitalized 10 days back for COVID-19 and discharged home on 1/13 with plan on outpatient remdesivir infusion.  He however did not follow-up at the infusion center (had 2 doses remaining, stating that his wife was not feeling well and he could not go there).  Patient reported diffuse body ache, nausea and vomiting but no abdominal pain or diarrhea.  Denies any fevers or chills, dyspnea or cough.  Reported poor p.o. intake. In the ED he was afebrile, found to be in a flutter with RVR and prolonged QTC of 542.  TSH of 2.8.  He is D-dimer was 1900, ferritin of 297 and LDH of 144.  Chemistry showed BUN of 53 and creatinine 3.7 (improved from previously).  His magnesium was 1.4 and high-sensitivity of 24.  He had WBC of 13.6 and hemoglobin of 7.7.  Chest x-ray showed improved bibasilar opacities compared to recent hospitalization. Patient given IV and p.o. Lopressor, IV magnesium sulfate following which his heart rate improved to sinus rhythm. Admitted for further management.    Subjective:   Feels weakn no SOB. Off cardizem drip, PVCs on monitor   Assessment  &  Plan :    Active Problems: New onset atrial flutter with rapid ventricular response (HCC) Heart rate improved with IV and p.o. Lopressor.  HR stable with mltiple PVCs Placed on p.o. Cardizem 30 mg every 6 hours.  Low magnesium replaced.  Potassium normal.  Given his chads 2 vascular of 3 he was started  on Eliquis however noted for drop in hemoglobin to 6.8.  Will type and screen and transfuse 1 unit PRBC.  Hold off on further anticoagulation.  TSH normal.  Check 2D echo. Cardiology consulted.  Active symptoms Recent covid 19 Did not go for outpatient remdesivir infusion stating he was weak and his wife was sick as well.  Currently no respiratory issues.  Had nausea and vomiting prior to admission which has resolved.  Started on Decadron 4 COPD.  Both fibrin derivative D-dimer and CRP elevated however respiratory function is stable.  We will hold off on remdesivir or Actemra. Continue airborne and contact precautions.  Anemia of chronic disease/iron deficient anemia Baseline hemoglobin in the 8, dropped to 6.8.  Ordered 1 unit PRBC.  Serial H&H.  Check stool for Hemoccult, iron panel and B12.  Will hold off on anticoagulation.  Add PPI.  COPD with mild exacerbation Continue as needed albuterol.  On daily Decadron.  Type 2 diabetes mellitus, controlled Continue sliding scale coverage.  Monitor for hypoglycemia while on steroid.  Chronic kidney disease stage IV Renal function at baseline.  Monitor closely.  Continue hydration.  Essential hypertension Continue midodrine.  Added Cardizem.  Will resume torsemide renal function stable.  Hepatic cirrhosis, compensated Monitor for now.  Code Status : Full code  Family Communication  : None at bedside  Disposition Plan  : Home pending clinical improvement of his A. fib, anemia and renal function.  Barriers For Discharge : Active symptoms  Consults  : Cardiology  Procedures  : 2D echo  DVT Prophylaxis  : SCDs  Lab Results    Component Value Date   PLT 102 (L) 04/29/2019    Antibiotics  : None  Anti-infectives (From admission, onward)   None        Objective:   Vitals:   04/29/19 0745 04/29/19 0800 04/29/19 0830 04/29/19 0900  BP:  139/71 (!) 151/74 (!) 156/97  Pulse: (!) 57 61 83 78  Resp: _0 Temp:    99 F (37.2 C)  TempSrc:    Oral  SpO2: 97% 95% 98% 98%  Weight:      Height:        Wt Readings from Last 3 Encounters:  04/28/19 83.9 kg  04/19/19 81 kg  04/14/19 81.6 kg     Intake/Output Summary (Last 24 hours) at 04/29/2019 0931 Last data filed at 04/29/2019 0848 Gross per 24 hour  Intake --  Output 500 ml  Net -500 ml     Physical Exam  Gen: not in distress, fatigued HEENT:moist mucosa, supple neck Chest: clear b/l, no added sounds CVS: N S1&S2, no murmurs, rubs or gallop GI: soft, NT, ND,  Musculoskeletal: warm, no edema     Data Review:    CBC Recent Labs  Lab 04/28/19 2113 04/29/19 0050  WBC 13.7* 10.5  HGB 7.7* 6.8*  HCT 23.7* 21.3*  PLT 126* 102*  MCV 89.4 91.4  MCH 29.1 29.2  MCHC 32.5 31.9  RDW 13.7 13.9    Chemistries  Recent Labs  Lab 04/28/19 2113 04/29/19 0050  NA 130* 132*  K 4.4 4.4  CL 100 103  CO2 19* 20*  GLUCOSE 134* 143*  BUN 53* 53*  CREATININE 3.70* 3.95*  CALCIUM 7.7* 7.3*  MG 1.4*  --    ------------------------------------------------------------------------------------------------------------------ Recent Labs    04/29/19 0050  CHOL 124  HDL 22*  LDLCALC 73  TRIG 145  CHOLHDL 5.6    Lab  Results  Component Value Date   HGBA1C 5.1 04/20/2019   ------------------------------------------------------------------------------------------------------------------ Recent Labs    04/29/19 0049  TSH 2.820   ------------------------------------------------------------------------------------------------------------------ Recent Labs    04/29/19 0049  FERRITIN 297    Coagulation profile No results for  input(s): INR, PROTIME in the last 168 hours.  No results for input(s): DDIMER in the last 72 hours.  Cardiac Enzymes No results for input(s): CKMB, TROPONINI, MYOGLOBIN in the last 168 hours.  Invalid input(s): CK ------------------------------------------------------------------------------------------------------------------    Component Value Date/Time   BNP 547.0 (H) 04/19/2019 1957    Inpatient Medications  Scheduled Meds: . albuterol  2 puff Inhalation QID  . apixaban  5 mg Oral BID  . dexamethasone (DECADRON) injection  6 mg Intravenous q1800  . diltiazem  30 mg Oral Q6H   Continuous Infusions: . sodium chloride 100 mL/hr at 04/29/19 0057   PRN Meds:.acetaminophen, ALPRAZolam, metoprolol tartrate, ondansetron (ZOFRAN) IV, zolpidem  Micro Results Recent Results (from the past 240 hour(s))  Respiratory Panel by RT PCR (Flu A&B, Covid) - Nasopharyngeal Swab     Status: Abnormal   Collection Time: 04/19/19  8:02 PM   Specimen: Nasopharyngeal Swab  Result Value Ref Range Status   SARS Coronavirus 2 by RT PCR POSITIVE (A) NEGATIVE Final    Comment: RESULT CALLED TO, READ BACK BY AND VERIFIED WITH: MAC BROWN 04/19/19 AT 2227 HS    Influenza A by PCR NEGATIVE NEGATIVE Final   Influenza B by PCR NEGATIVE NEGATIVE Final    Comment: (NOTE) The Xpert Xpress SARS-CoV-2/FLU/RSV assay is intended as an aid in  the diagnosis of influenza from Nasopharyngeal swab specimens and  should not be used as a sole basis for treatment. Nasal washings and  aspirates are unacceptable for Xpert Xpress SARS-CoV-2/FLU/RSV  testing. Fact Sheet for Patients: PinkCheek.be Fact Sheet for Healthcare Providers: GravelBags.it This test is not yet approved or cleared by the [place] FDA and  has been authorized for detection and/or diagnosis of SARS-CoV-2 by  FDA under an Emergency Use Authorization (EUA). This EUA will remain  in  effect (meaning this test can be used) for the duration of the  Covid-19 declaration under Section 564(b)(1) of the Act, 21  U.S.C. section 360bbb-3(b)(1), unless the authorization is  terminated or revoked. Performed at [facility], [address], [place], [place] [address]     Radiology Reports DG Chest 2 View  Result Date: 04/28/2019 CLINICAL DATA:  Body aches EXAM: CHEST - 2 VIEW COMPARISON:  April 19, 2019 FINDINGS: There are persistent but improved peripheral bilateral airspace opacities. The heart size remains borderline enlarged. There is no pneumothorax. No large pleural effusion. There is no acute osseous abnormality. No significant pleural effusion. IMPRESSION: Improving bilateral airspace opacities.  COPD. Electronically Signed   By: Constance Holster M.D.   On: 04/28/2019 21:51   DG Chest 2 View  Result Date: 04/14/2019 CLINICAL DATA:  Dyspnea on exertion for 1 week EXAM: CHEST - 2 VIEW COMPARISON:  04/01/2019 FINDINGS: Cardiac shadow is stable. Aortic calcifications are again seen. The lungs are well aerated bilaterally. No focal infiltrate or sizable effusion is seen. Chronic blunting of the right costophrenic angle is noted. Interval improved aeration in the left base is seen. IMPRESSION: Chronic blunting of the right costophrenic angle. No other focal abnormality is seen. Electronically Signed   By: Inez Catalina M.D.   On: 04/14/2019 15:22   DG Chest 2 View  Result Date: 04/01/2019 CLINICAL DATA:  Shortness  of breath EXAM: CHEST - 2 VIEW COMPARISON:  January 21, 2018 FINDINGS: There is mild cardiomegaly. Hyperinflation of the lung zones. Mildly increased hazy airspace opacity seen at the left lung base. There are trace bilateral pleural effusions. Aortic knob calcifications. No acute osseous abnormality. IMPRESSION: Mildly increased hazy airspace opacity at the left lung base which could be due to atelectasis and/or early infectious etiology. Trace bilateral  pleural effusions Electronically Signed   By: Prudencio Pair M.D.   On: 04/01/2019 19:05   DG Chest Portable 1 View  Result Date: 04/19/2019 CLINICAL DATA:  Shortness of breath. EXAM: PORTABLE CHEST 1 VIEW COMPARISON:  April 14, 2019 FINDINGS: There is been interval development of bilateral airspace opacities primarily in the mid lung zones bilaterally. The heart size remains enlarged. There is persistent blunting of the costophrenic angles. There is no acute osseous abnormality. Aortic calcifications are noted. The lungs appear to be somewhat hyperexpanded. IMPRESSION: 1. Interval development of bilateral airspace opacities concerning for developing multifocal pneumonia. Pulmonary edema can have a similar appearance in the appropriate clinical setting. 2. Hyperexpanded lungs, similar to prior study. 3. Persistent blunting of the costophrenic angles which may be secondary to small bilateral pleural effusions and or atelectasis. Electronically Signed   By: Constance Holster M.D.   On: 04/19/2019 20:06    Time Spent in minutes  35   Helaine Yackel M.D on 04/29/2019 at 9:31 AM  Between 7am to 7pm - Pager - [phone]  After 7pm go to [email or web address] - password [facility]  [facility] -  Office  [phone]

## 2019-04-29 NOTE — H&P (Addendum)
[facility] NAME: Dequane Strahan    MR#:  [id number]  DATE OF BIRTH:  1950-04-17  DATE OF ADMISSION:  04/28/2019  PRIMARY CARE PHYSICIAN: [name], MD   REQUESTING/REFERRING PHYSICIAN: Blake Divine, MD  CHIEF COMPLAINT:   Chief Complaint  Patient presents with  . COVID    HISTORY OF PRESENT ILLNESS:  Jayse Hodkinson  is a 69 y.o. Caucasian male with a known history of medical history significant for COPD, type 2 diabetes, hypertension, CKD stage IV, cirrhosis, IgA MGUS, and anemia of chronic disease and iron deficiency who presents to the emergency room with acute onset of malaise and generalized body aches.  He was recently admitted here on 110 and tested positive for COVID-19.  He has been feeling bad as he says since he went home he has been having hard time getting out of bed.  Describes diffuse body aches.  Denies any chest pain or palpitations.  He admits to mild nausea and vomiting without diarrhea or abdominal pain.  No fever or chills.  No worsening dyspnea or cough.  He admits to wheezing.  He has been having diminished appetite.  Upon presentation to the emergency room, temperature was 99.1 with otherwise normal vital signs.  He has been intermittently tachycardic to 140.  Pulse ox and has been normal at 95 to 96% on room air.  His EKG initially showed sinus rhythm with occasional PVCs and left anterior fascicular block.  And later showed atrial flutter with RVR 116 with slightly peaked T waves laterally.  Another EKG very shortly showed atrial flutter with 2-1 AV block with left anterior fascicular block and a rate of 126 with prolonged QT interval of 542 MS.  TSH came back 2.8 and fibrin derivatives D-dimer was 1935 with ferritin of 297 and LDH of 144.  His CMP was remarkable for a BUN of 53 and creatinine of 3.7 down from 97 and 4.66, magnesium 1.4 and his high-sensitivity troponin I was 24 with CBC showing leukocytosis of 13.6 and anemia with  hemoglobin of 7.7 hematocrit 23.7 and platelets 126.  Chest x-ray showed better looking bibasal opacities compared to his recent admission for Covid related pneumonia..  The patient was given Lopressor 25 mg p.o. and 5 mg IV as well as 2 g of IV magnesium sulfate.  He was normal sinus rhythm during my interview with a rate in the 80s.  He will be admitted to a telemetry bed for further evaluation and management. PAST MEDICAL HISTORY:   Past Medical History:  Diagnosis Date  . Anemia   . Cancer ([place]) 11/02/2014   PERIANAL MASS, RIGHT; INCISIONAL BIOPSY: INVASIVE CARCINOMA WITH BASALOID FEATURES  . CHF (congestive heart failure) ([place])   . Chronic kidney disease   . COPD (chronic obstructive pulmonary disease) ([place])    NO INHALERS  . Diabetes mellitus    NO MEDS  . GERD (gastroesophageal reflux disease)   . Hepatic cirrhosis ([place])   . Hypertension   . Leg swelling   . Loose, teeth    PATIENT STATES "HAS 6 TEETH, SOME ARE LOOSE"  . Neuromuscular disorder ([place])    RIGHT HAND AND FINGERS NUMB  . Shortness of breath dyspnea    WITH EXERTION   . 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 APPENDECTOMY    . COLONOSCOPY WITH PROPOFOL N/A 11/19/2014  Procedure: COLONOSCOPY WITH PROPOFOL;  Surgeon: Lucilla Lame, MD;  Location: [facility];  Service: Endoscopy;  Laterality: N/A;  DIABETIC-ORAL MEDS  . COLONOSCOPY WITH PROPOFOL N/A 10/04/2016   Procedure: COLONOSCOPY WITH PROPOFOL;  Surgeon: Jonathon Bellows, MD;  Location: [facility] ENDOSCOPY;  Service: Endoscopy;  Laterality: N/A;  . ESOPHAGOGASTRODUODENOSCOPY (EGD) WITH PROPOFOL N/A 10/04/2016   Procedure: ESOPHAGOGASTRODUODENOSCOPY (EGD) WITH PROPOFOL;  Surgeon: Jonathon Bellows, MD;  Location: [facility] ENDOSCOPY;  Service: Endoscopy;  Laterality: N/A;  . GIVENS CAPSULE STUDY N/A 11/13/2016   Procedure: GIVENS CAPSULE STUDY;  Surgeon: Jonathon Bellows, MD;  Location:  [facility] ENDOSCOPY;  Service: Gastroenterology;  Laterality: N/A;  . INCISION AND DRAINAGE PERIRECTAL ABSCESS Right 12/16/2017   Procedure: EXCISION BASAL CELL CANCER/ GLUTEAL ABSCESS AND FLAP COVERAGE;  Surgeon: Robert Bellow, MD;  Location: [facility] ORS;  Service: General;  Laterality: Right;  . INGUINAL LYMPH NODE BIOPSY Right 01/26/2016   Procedure: INGUINAL LYMPH NODE BIOPSY;  Surgeon: Clayburn Pert, MD;  Location: [facility] ORS;  Service: General;  Laterality: Right;  . RECTAL BIOPSY N/A 11/02/2014   Procedure: BIOPSY RECTAL;  Surgeon: Marlyce Huge, MD;  Location: [facility] ORS;  Service: General;  Laterality: N/A;  . RECTAL EXAM UNDER ANESTHESIA N/A 11/02/2014   Procedure: RECTAL EXAM UNDER ANESTHESIA;  Surgeon: Marlyce Huge, MD;  Location: [facility] ORS;  Service: General;  Laterality: N/A;  . TONSILLECTOMY      SOCIAL HISTORY:   Social History   Tobacco Use  . Smoking status: Former Smoker    Packs/day: 0.25    Years: 20.00    Pack years: 5.00    Types: Cigarettes  . Smokeless tobacco: Never Used  . Tobacco comment: 3-4 cigarettes per day  Substance Use Topics  . Alcohol use: Not Currently    FAMILY HISTORY:   Family History  Problem Relation Age of Onset  . Asthma Mother   . Colon cancer Neg Hx     DRUG ALLERGIES:  No Known Allergies  REVIEW OF SYSTEMS:   ROS As per history of present illness. All pertinent systems were reviewed above. Constitutional,  HEENT, cardiovascular, respiratory, GI, GU, musculoskeletal, neuro, psychiatric, endocrine,  integumentary and hematologic systems were reviewed and are otherwise  negative/unremarkable except for positive findings mentioned above in the HPI.   MEDICATIONS AT HOME:   Prior to Admission medications   Medication Sig Start Date End Date Taking? Authorizing Provider  albuterol (VENTOLIN HFA) 108 (90 Base) MCG/ACT inhaler Inhale 2 puffs into the lungs every 6 (six) hours as needed for wheezing or shortness of  breath.   Yes [provider]  allopurinol (ZYLOPRIM) 100 MG tablet Take 100 mg by mouth daily. 03/20/19  Yes [provider]  amLODipine (NORVASC) 10 MG tablet Take 10 mg by mouth daily.    Yes [provider]  ascorbic acid (VITAMIN C) 500 MG tablet Take 1 tablet (500 mg total) by mouth daily. 04/23/19 05/23/19 Yes Sreenath, Sudheer B, MD  betamethasone dipropionate 0.05 % cream Apply topically 2 (two) times daily. 02/24/19  Yes Edrick Kins, DPM  carvedilol (COREG) 3.125 MG tablet Take 3.125 mg by mouth 2 (two) times daily. 03/31/19  Yes [provider]  DULERA 200-5 MCG/ACT AERO Inhale 1 puff into the lungs 2 (two) times daily. 04/09/19  Yes [provider]  ferrous sulfate 325 (65 FE) MG tablet Take 325 mg by mouth daily.    Yes [provider]  gentamicin cream (GARAMYCIN) 0.1 % Apply 1 application topically 2 (two)  times daily. 02/24/19  Yes Edrick Kins, DPM  hydrALAZINE (APRESOLINE) 100 MG tablet Take 100 mg by mouth 3 (three) times daily.    Yes [provider]  omeprazole (PRILOSEC) 40 MG capsule Take 40 mg by mouth every morning.    Yes [provider]  polyethylene glycol (MIRALAX / GLYCOLAX) packet Take 17 g by mouth daily as needed for mild constipation.    Yes [provider]  sodium bicarbonate 650 MG tablet Take 1 tablet (650 mg total) by mouth 3 (three) times daily. 01/24/18  Yes Vaughan Basta, MD  SPIRIVA RESPIMAT 1.25 MCG/ACT AERS Inhale 1 spray into the lungs daily. 04/09/19  Yes [provider]  torsemide (DEMADEX) 10 MG tablet Take 10 mg by mouth daily.    Yes [provider]  vitamin B-12 (CYANOCOBALAMIN) 1000 MCG tablet Take 1 tablet (1,000 mcg total) by mouth daily. 06/03/17  Yes Sindy Guadeloupe, MD  zinc sulfate 220 (50 Zn) MG capsule Take 1 capsule (220 mg total) by mouth daily. 04/23/19 05/23/19 Yes Sreenath, Sudheer B, MD      VITAL SIGNS:  Blood pressure (!)  109/54, pulse 84, temperature 99.1 F (37.3 C), temperature source Oral, resp. rate 19, height 5\' 10"  (1.778 m), weight 83.9 kg, SpO2 97 %.  PHYSICAL EXAMINATION:  Physical Exam  GENERAL:  69 y.o.-year-old Caucasian male patient lying in the bed with no acute distress.  EYES: Pupils equal, round, reactive to light and accommodation. No scleral icterus. Extraocular muscles intact.  HEENT: Head atraumatic, normocephalic. Oropharynx and nasopharynx clear.  NECK:  Supple, no jugular venous distention. No thyroid enlargement, no tenderness.  LUNGS: Slightly diffuse expiratory wheezes with no crackles or rhonchi. CARDIOVASCULAR: Regular rate and rhythm, S1, S2 normal. No murmurs, rubs, or gallops.  ABDOMEN: Soft, nondistended, nontender. Bowel sounds present. No organomegaly or mass.  EXTREMITIES: No pedal edema, cyanosis, or clubbing.  NEUROLOGIC: Cranial nerves II through XII are intact. Muscle strength 5/5 in all extremities. Sensation intact. Gait not checked.  PSYCHIATRIC: The patient is alert and oriented x 3.  Normal affect and good eye contact. SKIN: No obvious rash, lesion, or ulcer.   LABORATORY PANEL:   CBC Recent Labs  Lab 04/28/19 2113  WBC 13.7*  HGB 7.7*  HCT 23.7*  PLT 126*   ------------------------------------------------------------------------------------------------------------------  Chemistries  Recent Labs  Lab 04/22/19 0630 04/22/19 0630 04/28/19 2113  NA 129*   < > 130*  K 4.7   < > 4.4  CL 100   < > 100  CO2 18*   < > 19*  GLUCOSE 392*   < > 134*  BUN 97*   < > 53*  CREATININE 4.66*   < > 3.70*  CALCIUM 7.5*   < > 7.7*  MG 2.2   < > 1.4*  AST 39  --   --   ALT 36  --   --   ALKPHOS 82  --   --   BILITOT 0.5  --   --    < > = values in this interval not displayed.   ------------------------------------------------------------------------------------------------------------------  Cardiac Enzymes No results for input(s): TROPONINI in the last  168 hours. ------------------------------------------------------------------------------------------------------------------  RADIOLOGY:  DG Chest 2 View  Result Date: 04/28/2019 CLINICAL DATA:  Body aches EXAM: CHEST - 2 VIEW COMPARISON:  April 19, 2019 FINDINGS: There are persistent but improved peripheral bilateral airspace opacities. The heart size remains borderline enlarged. There is no pneumothorax. No large pleural effusion. There is  no acute osseous abnormality. No significant pleural effusion. IMPRESSION: Improving bilateral airspace opacities.  COPD. Electronically Signed   By: Constance Holster M.D.   On: 04/28/2019 21:51      IMPRESSION AND PLAN:   1.  New onset paroxysmal atrial flutter with rapid ventricular response.  The patient will be admitted to telemetry bed.  We will utilize as needed IV Lopressor at this time.  We replaced his magnesium and will repeat level is hypomagnesemic been contributing to his tachycardic rhythm.  A 2D "consultation will be obtained in a.m. by Dr. Crissie Sickles who was notified about the patient.  We will follow more troponin I levels.  The patient has CHA2DS2-VASc 2 score of 3 and therefore will be placed on Eliquis.  2.  Mild COPD exacerbation.  The patient will be placed on Decadron as well as albuterol MDI 4 times daily and every 4 hours as needed.  3.  Hypomagnesemia.  Magnesium was replaced and will be followed.  This could be contributing to #1.  Potassium level is normal.  4.  Recent COVID-19.  Patient has no current fever with normal pulse oximetry and improving chest x-ray.  Inflammatory markers are normalizing.  We will add CRP and follow level.  5.  Hypertension.  Continue antihypertensives.  6.  Type 2 diabetes mellitus.  We will place the patient on supplement coverage with NovoLog.  7.  Stage IV chronic kidney disease.  Renal function are currently better.  We will continue monitoring.  8.  DVT prophylaxis.  We will place the  patient on Eliquis.    All the records are reviewed and case discussed with ED provider. The plan of care was discussed in details with the patient (and family). I answered all questions. The patient agreed to proceed with the above mentioned plan. Further management will depend upon hospital course.   CODE STATUS: Full code  TOTAL TIME TAKING CARE OF THIS PATIENT: 55 minutes.    Christel Mormon M.D on 04/29/2019 at 12:16 AM  [facility]   From 7 PM-7 AM, contact night-coverage [email or web address]  CC: Primary care physician; [name], MD   Note: This dictation was prepared with Dragon dictation along with smaller phrase technology. Any transcriptional errors that result from this process are unintentional.

## 2019-04-29 NOTE — Progress Notes (Signed)
Pt received to room 233 via stretcher from ED. Pt alert and oriented. Ambulatory to bed from stretcher. Pt oriented to room. Continues to c/o fatigue, body aches. Denies shortness of breath, difficulty breathing at this time. 02 sat 96% on room air. Will continue to  Monitor closely.

## 2019-04-29 NOTE — Consult Note (Signed)
Cardiology Consultation Note    Patient ID: [name], MRN: [id number], DOB/AGE: 1950-11-22 69 y.o. Admit date: 04/28/2019   Date of Consult: 04/29/2019 Primary Physician: [name], MD Primary Cardiologist: Dr. Saralyn Pilar  Chief Complaint: weakness and fatigue/ Reason for Consultation: afib Requesting MD: Dr. Clementeen Graham  HPI: [name] is a 69 y.o. male with history of chronic anemia, chronic venous insufficiency, diabetes mellitus, hyperlipidemia, hypertension, CKD stage IV, cirrhosis and squamous cell carcinoma who presented to the emergency room with complaints of dyspnea weakness and fatigue.  He was last seen in cardiology and August 2019.  He had an echocardiogram in 2018 showing normal LV function.  He complained of malaise and general body aches.  He was Covid positive on 04/19/2019.  He states he has diffuse body aches and has been able to get out of bed due to weakness and fatigue and bad feeling.  He has a poor appetite.  In the emergency room he had a temperature of 99.1.  Initial EKG showed sinus rhythm.  There was no ischemia.  Subsequent EKGs showed atrial flutter.  Initial is into the 1 block currently on 3 to 4-1 block.  Laboratories revealed anemia with an initial hemoglobin of 7.7 with a BUN/creatinine of 53 and 3.7.  His baseline is a creatinine around 4.  His serum potassium is 4.4.  Troponin high-sensitivity drawn per protocol was 24.  This is at his baseline.  Subsequent creatinine was 3.95.  CBC showed a hemoglobin of 6.8 early this morning.  Chest x-ray revealed improving bilateral airspace opacities based on his previous study.  Pulse oximetry was normal.  Telemetry currently shows sinus rhythm.  Patient denies GI bleed but states he has been coughing up some blood.  He was given IV and p.o. Lopressor IV magnesium.  Currently is on Cardizem 30 mg every 6 hours. Past Medical History:  Diagnosis Date  . Anemia   . Cancer ([place]) 11/02/2014   PERIANAL MASS, RIGHT;  INCISIONAL BIOPSY: INVASIVE CARCINOMA WITH BASALOID FEATURES  . CHF (congestive heart failure) ([place])   . Chronic kidney disease   . COPD (chronic obstructive pulmonary disease) ([place])    NO INHALERS  . Diabetes mellitus    NO MEDS  . GERD (gastroesophageal reflux disease)   . Hepatic cirrhosis ([place])   . Hypertension   . Leg swelling   . Loose, teeth    PATIENT STATES "HAS 6 TEETH, SOME ARE LOOSE"  . Neuromuscular disorder ([place])    RIGHT HAND AND FINGERS NUMB  . Shortness of breath dyspnea    WITH EXERTION   . 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 02/24/2016      Surgical History:  Past Surgical History:  Procedure Laterality Date  . APPENDECTOMY    . COLONOSCOPY WITH PROPOFOL N/A 11/19/2014   Procedure: COLONOSCOPY WITH PROPOFOL;  Surgeon: Lucilla Lame, MD;  Location: [facility];  Service: Endoscopy;  Laterality: N/A;  DIABETIC-ORAL MEDS  . COLONOSCOPY WITH PROPOFOL N/A 10/04/2016   Procedure: COLONOSCOPY WITH PROPOFOL;  Surgeon: Jonathon Bellows, MD;  Location: [facility] ENDOSCOPY;  Service: Endoscopy;  Laterality: N/A;  . ESOPHAGOGASTRODUODENOSCOPY (EGD) WITH PROPOFOL N/A 10/04/2016   Procedure: ESOPHAGOGASTRODUODENOSCOPY (EGD) WITH PROPOFOL;  Surgeon: Jonathon Bellows, MD;  Location: [facility] ENDOSCOPY;  Service: Endoscopy;  Laterality: N/A;  . GIVENS CAPSULE STUDY N/A 11/13/2016   Procedure: GIVENS CAPSULE STUDY;  Surgeon: Jonathon Bellows, MD;  Location: [facility] ENDOSCOPY;  Service: Gastroenterology;  Laterality: N/A;  . INCISION AND  DRAINAGE PERIRECTAL ABSCESS Right 12/16/2017   Procedure: EXCISION BASAL CELL CANCER/ GLUTEAL ABSCESS AND FLAP COVERAGE;  Surgeon: Robert Bellow, MD;  Location: [facility] ORS;  Service: General;  Laterality: Right;  . INGUINAL LYMPH NODE BIOPSY Right 01/26/2016   Procedure: INGUINAL LYMPH NODE BIOPSY;  Surgeon: Clayburn Pert, MD;  Location: [facility] ORS;  Service: General;  Laterality: Right;  . RECTAL BIOPSY N/A 11/02/2014   Procedure: BIOPSY RECTAL;  Surgeon: Marlyce Huge, MD;  Location: [facility] ORS;  Service: General;  Laterality: N/A;  . RECTAL EXAM UNDER ANESTHESIA N/A 11/02/2014   Procedure: RECTAL EXAM UNDER ANESTHESIA;  Surgeon: Marlyce Huge, MD;  Location: [facility] ORS;  Service: General;  Laterality: N/A;  . TONSILLECTOMY       Home Meds: Prior to Admission medications   Medication Sig Start Date End Date Taking? Authorizing Provider  albuterol (VENTOLIN HFA) 108 (90 Base) MCG/ACT inhaler Inhale 2 puffs into the lungs every 6 (six) hours as needed for wheezing or shortness of breath.   Yes [provider]  allopurinol (ZYLOPRIM) 100 MG tablet Take 100 mg by mouth daily. 03/20/19  Yes [provider]  amLODipine (NORVASC) 10 MG tablet Take 10 mg by mouth daily.    Yes [provider]  ascorbic acid (VITAMIN C) 500 MG tablet Take 1 tablet (500 mg total) by mouth daily. 04/23/19 05/23/19 Yes Sreenath, Sudheer B, MD  betamethasone dipropionate 0.05 % cream Apply topically 2 (two) times daily. 02/24/19  Yes Edrick Kins, DPM  carvedilol (COREG) 3.125 MG tablet Take 3.125 mg by mouth 2 (two) times daily. 03/31/19  Yes [provider]  DULERA 200-5 MCG/ACT AERO Inhale 1 puff into the lungs 2 (two) times daily. 04/09/19  Yes [provider]  ferrous sulfate 325 (65 FE) MG tablet Take 325 mg by mouth daily.    Yes [provider]  gentamicin cream (GARAMYCIN) 0.1 % Apply 1 application topically 2 (two) times daily. 02/24/19  Yes Edrick Kins, DPM  hydrALAZINE (APRESOLINE) 100 MG tablet Take 100 mg by mouth 3 (three) times daily.    Yes [provider]  omeprazole (PRILOSEC) 40 MG capsule Take 40 mg by mouth every morning.    Yes [provider]  polyethylene glycol (MIRALAX / GLYCOLAX) packet Take 17 g by mouth daily as needed for mild constipation.    Yes [provider]  sodium bicarbonate 650 MG tablet Take 1 tablet (650 mg total) by mouth 3 (three) times daily.  01/24/18  Yes Vaughan Basta, MD  SPIRIVA RESPIMAT 1.25 MCG/ACT AERS Inhale 1 spray into the lungs daily. 04/09/19  Yes [provider]  torsemide (DEMADEX) 10 MG tablet Take 10 mg by mouth daily.    Yes [provider]  vitamin B-12 (CYANOCOBALAMIN) 1000 MCG tablet Take 1 tablet (1,000 mcg total) by mouth daily. 06/03/17  Yes Sindy Guadeloupe, MD  zinc sulfate 220 (50 Zn) MG capsule Take 1 capsule (220 mg total) by mouth daily. 04/23/19 05/23/19 Yes Sidney Ace, MD    Inpatient Medications:  . sodium chloride   Intravenous Once  . albuterol  2 puff Inhalation QID  . dexamethasone (DECADRON) injection  6 mg Intravenous q1800  . diltiazem  30 mg Oral Q6H   . sodium chloride 100 mL/hr at 04/29/19 1140    Allergies: No Known Allergies  Social History   Socioeconomic History  . Marital status: Divorced    Spouse name: Not on file  . Number of  children: Not on file  . Years of education: Not on file  . Highest education level: Not on file  Occupational History  . Not on file  Tobacco Use  . Smoking status: Former Smoker    Packs/day: 0.25    Years: 20.00    Pack years: 5.00    Types: Cigarettes  . Smokeless tobacco: Never Used  . Tobacco comment: 3-4 cigarettes per day  Substance and Sexual Activity  . Alcohol use: Not Currently  . Drug use: Not Currently    Comment: Last Use 2016 per patient-+ UDS FOR COCAINE IN 2014  . Sexual activity: Never  Other Topics Concern  . Not on file  Social History Narrative  . Not on file   Social Determinants of Health   Financial Resource Strain:   . Difficulty of Paying Living Expenses: Not on file  Food Insecurity:   . Worried About Charity fundraiser in the Last Year: Not on file  . Ran Out of Food in the Last Year: Not on file  Transportation Needs:   . Lack of Transportation (Medical): Not on file  . Lack of Transportation (Non-Medical): Not on file  Physical Activity:   . Days of Exercise per  Week: Not on file  . Minutes of Exercise per Session: Not on file  Stress:   . Feeling of Stress : Not on file  Social Connections:   . Frequency of Communication with Friends and Family: Not on file  . Frequency of Social Gatherings with Friends and Family: Not on file  . Attends Religious Services: Not on file  . Active Member of Clubs or Organizations: Not on file  . Attends Archivist Meetings: Not on file  . Marital Status: Not on file  Intimate Partner Violence:   . Fear of Current or Ex-Partner: Not on file  . Emotionally Abused: Not on file  . Physically Abused: Not on file  . Sexually Abused: Not on file     Family History  Problem Relation Age of Onset  . Asthma Mother   . Colon cancer Neg Hx      Review of Systems: A 12-system review of systems was performed and is negative except as noted in the HPI.  Labs: No results for input(s): CKTOTAL, CKMB, TROPONINI in the last 72 hours. Lab Results  Component Value Date   WBC 10.5 04/29/2019   HGB 6.8 (L) 04/29/2019   HCT 21.3 (L) 04/29/2019   MCV 91.4 04/29/2019   PLT 102 (L) 04/29/2019    Recent Labs  Lab 04/29/19 0050  NA 132*  K 4.4  CL 103  CO2 20*  BUN 53*  CREATININE 3.95*  CALCIUM 7.3*  GLUCOSE 143*   Lab Results  Component Value Date   CHOL 124 04/29/2019   HDL 22 (L) 04/29/2019   LDLCALC 73 04/29/2019   TRIG 145 04/29/2019   No results found for: DDIMER  Radiology/Studies:  DG Chest 2 View  Result Date: 04/28/2019 CLINICAL DATA:  Body aches EXAM: CHEST - 2 VIEW COMPARISON:  April 19, 2019 FINDINGS: There are persistent but improved peripheral bilateral airspace opacities. The heart size remains borderline enlarged. There is no pneumothorax. No large pleural effusion. There is no acute osseous abnormality. No significant pleural effusion. IMPRESSION: Improving bilateral airspace opacities.  COPD. Electronically Signed   By: Constance Holster M.D.   On: 04/28/2019 21:51   DG Chest  2 View  Result Date: 04/14/2019 CLINICAL DATA:  Dyspnea  on exertion for 1 week EXAM: CHEST - 2 VIEW COMPARISON:  04/01/2019 FINDINGS: Cardiac shadow is stable. Aortic calcifications are again seen. The lungs are well aerated bilaterally. No focal infiltrate or sizable effusion is seen. Chronic blunting of the right costophrenic angle is noted. Interval improved aeration in the left base is seen. IMPRESSION: Chronic blunting of the right costophrenic angle. No other focal abnormality is seen. Electronically Signed   By: Inez Catalina M.D.   On: 04/14/2019 15:22   DG Chest 2 View  Result Date: 04/01/2019 CLINICAL DATA:  Shortness of breath EXAM: CHEST - 2 VIEW COMPARISON:  January 21, 2018 FINDINGS: There is mild cardiomegaly. Hyperinflation of the lung zones. Mildly increased hazy airspace opacity seen at the left lung base. There are trace bilateral pleural effusions. Aortic knob calcifications. No acute osseous abnormality. IMPRESSION: Mildly increased hazy airspace opacity at the left lung base which could be due to atelectasis and/or early infectious etiology. Trace bilateral pleural effusions Electronically Signed   By: Prudencio Pair M.D.   On: 04/01/2019 19:05   DG Chest Portable 1 View  Result Date: 04/19/2019 CLINICAL DATA:  Shortness of breath. EXAM: PORTABLE CHEST 1 VIEW COMPARISON:  April 14, 2019 FINDINGS: There is been interval development of bilateral airspace opacities primarily in the mid lung zones bilaterally. The heart size remains enlarged. There is persistent blunting of the costophrenic angles. There is no acute osseous abnormality. Aortic calcifications are noted. The lungs appear to be somewhat hyperexpanded. IMPRESSION: 1. Interval development of bilateral airspace opacities concerning for developing multifocal pneumonia. Pulmonary edema can have a similar appearance in the appropriate clinical setting. 2. Hyperexpanded lungs, similar to prior study. 3. Persistent blunting of the  costophrenic angles which may be secondary to small bilateral pleural effusions and or atelectasis. Electronically Signed   By: Constance Holster M.D.   On: 04/19/2019 20:06    Wt Readings from Last 3 Encounters:  04/28/19 83.9 kg  04/19/19 81 kg  04/14/19 81.6 kg    EKG: Initial EKG sinus rhythm with no ischemia.  Subsequent EKG atrial flutter with 4-1 and 2-1 block.  Currently sinus rhythm at a rate of 58.  Physical Exam:  Blood pressure (!) 148/77, pulse (!) 58, temperature 97.8 F (36.6 C), resp. rate 18, height 5\' 10"  (1.778 m), weight 83.9 kg, SpO2 98 %. Body mass index is 26.54 kg/m. General: Well developed, well nourished, in no acute distress. Head: Normocephalic, atraumatic, sclera non-icteric, no xanthomas, nares are without discharge.  Neck: Negative for carotid bruits. JVD not elevated. Lungs: Clear bilaterally to auscultation without wheezes, rales, or rhonchi. Breathing is unlabored. Heart: RRR with S1 S2. No murmurs, rubs, or gallops appreciated. Abdomen: Soft, non-tender, non-distended with normoactive bowel sounds. No hepatomegaly. No rebound/guarding. No obvious abdominal masses. Msk:  Strength and tone appear normal for age. Extremities: No clubbing or cyanosis. No edema.  Distal pedal pulses are 2+ and equal bilaterally. Neuro: Alert and oriented X 3. No facial asymmetry. No focal deficit. Moves all extremities spontaneously. Psych:  Responds to questions appropriately with a normal affect.     Assessment and Plan  69 year old male with history of hypertension, chronic kidney disease grade 4, currently Covid positive after testing positive and admission 10 days ago admitted with malaise weakness fatigue and shortness of breath and noted to have a hemoglobin of 7.7 which is decreased to 6.6 since admission.  He initially was in sinus rhythm then converted atrial flutter and now back to sinus rhythm.  Is currently on Cardizem 30 mg every 6.  He is hemodynamically  stable.  Etiology of blood loss is unclear.  1.  Atrial flutter   -Etiology likely multifactorial to include profound anemia as well as recovering from Covid infection.  He appears to have converted back to sinus rhythm with metoprolol and is currently on Cardizem 30 every 6.  Would continue with this for now and follow-up for breakthrough arrhythmias.  He is relatively bradycardic so would not go up on this.  Would agree with holding metoprolol.  Not an anticoagulation candidate until he is cause for blood loss is noted.  Will need transfused.  Echocardiogram to evaluate LV function.  Previous echo several years ago was unremarkable for significant structural heart disease  2.  Covid positive   -Currently recovering from this.  Likely partly responsible for his atrial arrhythmia.  Airborne isolation  3.  CKD stage IV   -Creatinine appears to be at his baseline.  Will need to renally dose all medications if renally excreted.  Will need to closely follow as he receives blood and his volume status is stabilized.  No evidence of heart failure clinically or by chest x-ray  4.  Anemia   -Etiology unclear.  Blood transfusion pending.  Will need to follow hemoglobin.  Not a candidate for chronic anticoagulation.  5.  Hypertension   -We will continue with Cardizem and follow his pressure.  Was on amlodipine at 10 mg daily and carvedilol 3.125 mg twice daily, spironolactone 25 mg twice daily as an outpatient.  We will hold on the carvedilol since he is getting Cardizem and is relatively bradycardic.  We will add hydralazine 25 mg 3 times daily for now.  Consider adding spironolactone 25 twice daily back as renal function and hemodynamics stabilize.  Will remain off of amlodipine since he is also on Cardizem.   Signed, Teodoro Spray MD 04/29/2019, 3:57 PM Pager: [phone]

## 2019-04-30 ENCOUNTER — Inpatient Hospital Stay
Admit: 2019-04-30 | Discharge: 2019-04-30 | Disposition: A | Discharge status: Home / Self Care | Payer: Medicare Other | Attending: Internal Medicine | Admitting: Internal Medicine

## 2019-04-30 DIAGNOSIS — R5381 Other malaise: Secondary | ICD-10-CM

## 2019-04-30 DIAGNOSIS — R5383 Other fatigue: Secondary | ICD-10-CM

## 2019-04-30 DIAGNOSIS — D509 Iron deficiency anemia, unspecified: Secondary | ICD-10-CM

## 2019-04-30 LAB — TYPE AND SCREEN
ABO/RH(D): A POS
Antibody Screen: NEGATIVE
Unit division: 0

## 2019-04-30 LAB — CBC
HCT: 26.5 % — ABNORMAL LOW (ref 39.0–52.0)
Hemoglobin: 8.7 g/dL — ABNORMAL LOW (ref 13.0–17.0)
MCH: 28.5 pg (ref 26.0–34.0)
MCHC: 32.8 g/dL (ref 30.0–36.0)
MCV: 86.9 fL (ref 80.0–100.0)
Platelets: 129 10*3/uL — ABNORMAL LOW (ref 150–400)
RBC: 3.05 MIL/uL — ABNORMAL LOW (ref 4.22–5.81)
RDW: 13.3 % (ref 11.5–15.5)
WBC: 4.6 10*3/uL (ref 4.0–10.5)
nRBC: 0 % (ref 0.0–0.2)

## 2019-04-30 LAB — BPAM RBC
Blood Product Expiration Date: 202101292359
ISSUE DATE / TIME: 202101201846
Unit Type and Rh: 6200

## 2019-04-30 LAB — PROTIME-INR
INR: 1.2 (ref 0.8–1.2)
Prothrombin Time: 14.8 seconds (ref 11.4–15.2)

## 2019-04-30 LAB — ECHOCARDIOGRAM COMPLETE
Height: 70 in
Weight: 2960 oz

## 2019-04-30 LAB — HEPARIN LEVEL (UNFRACTIONATED): Heparin Unfractionated: 1.11 IU/mL — ABNORMAL HIGH (ref 0.30–0.70)

## 2019-04-30 LAB — APTT: aPTT: 29 seconds (ref 24–36)

## 2019-04-30 MED ORDER — HEPARIN BOLUS VIA INFUSION
2000.0000 [IU] | Freq: Once | INTRAVENOUS | Status: AC
  Administered 2019-04-30: 2000 [IU] via INTRAVENOUS
  Filled 2019-04-30: qty 2000

## 2019-04-30 MED ORDER — HEPARIN (PORCINE) 25000 UT/250ML-% IV SOLN
1450.0000 [IU]/h | INTRAVENOUS | Status: DC
  Administered 2019-04-30: 1150 [IU]/h via INTRAVENOUS
  Administered 2019-05-01 – 2019-05-02 (×3): 1450 [IU]/h via INTRAVENOUS
  Filled 2019-04-30 (×4): qty 250

## 2019-04-30 NOTE — Progress Notes (Signed)
*  PRELIMINARY RESULTS* Echocardiogram 2D Echocardiogram has been performed.  Sherrie Sport 04/30/2019, 9:33 AM

## 2019-04-30 NOTE — Progress Notes (Addendum)
PROGRESS NOTE                                                                                                                                                                                                             Patient Demographics:    [name], is a 69 y.o. male, DOB - [birth year], NFA:[id number]  Admit date - 04/28/2019   Admitting Physician Christel Mormon, MD  Outpatient Primary MD for the patient is [name], MD  LOS - 2  Outpatient Specialists: None  Chief Complaint  Patient presents with  . COVID       Brief Narrative   69 year old male with history of COPD, not on O2, type 2 diabetes mellitus, CKD stage IV, cirrhosis, IgA MGUS, anemia of chronic disease/iron deficiency, hypertension presented to the ED with generalized body ache and malaise.  He was hospitalized 10 days back for COVID-19 and discharged home on 1/13 with plan on outpatient remdesivir infusion.  He however did not follow-up at the infusion center (had 2 doses remaining, stating that his wife was not feeling well and he could not go there).  Patient reported diffuse body ache, nausea and vomiting but no abdominal pain or diarrhea.  Denies any fevers or chills, dyspnea or cough.  Reported poor p.o. intake. In the ED he was afebrile, found to be in a flutter with RVR and prolonged QTC of 542.  TSH of 2.8.  He is D-dimer was 1900, ferritin of 297 and LDH of 144.  Chemistry showed BUN of 53 and creatinine 3.7 (improved from previously).  His magnesium was 1.4 and high-sensitivity of 24.  He had WBC of 13.6 and hemoglobin of 7.7.  Chest x-ray showed improved bibasilar opacities compared to recent hospitalization. Patient given IV and p.o. Lopressor, IV magnesium sulfate following which his heart rate improved to sinus rhythm. Admitted for further management.    Subjective:   Reports still feeling weak but no shortness of breath.  Heart rate better  controlled on the monitor except for few runs of SVT and occasionally dropping down to the 50s.  Assessment  & Plan :    Active Problems: New onset atrial flutter with rapid ventricular response (HCC) On oral Cardizem 30 mg every 6 hours, with episodes of SVT and occasional bradycardia (heart rate in high 40s to 50s  on the monitor).  Potassium and magnesium normal. Started on IV heparin after discussion with GI (see below) . follow 2D echo.  TSH normal. Cardiology consult appreciated.    Active symptoms Recent covid 19 Did not go for outpatient remdesivir infusion stating he was weak and his wife was sick as well.  Currently no respiratory issues.  Had nausea and vomiting prior to admission which has resolved.  On Decadron 4 COPD.Marland Kitchen  Both fibrin derivative D-dimer and CRP elevated however respiratory function is stable.  Further remdesivir or Actemra not needed.  Continue precautions   Anemia of chronic disease/iron deficient anemia Baseline hemoglobin in the 8, dropped to 6.8.  Improved with 1 unit PRBC.  Stool for Hemoccult pending.  B12 normal.  On iron supplement. Patient had EGD and colonoscopy in 2018 for nonbleeding AVMs angiectasia and diverticulosis without bleeding.     Discussed with GI ([name]).  Given his chronic anemia with no signs of acute bleeding recommends to start him on IV heparin and monitor.  Given his recent COVID-19 infection it would be difficult to perform EGD/colonoscopy at present.  Recommends to monitor with IV heparin and if H&H stable, to continue long-term anticoagulation.  COPD with mild exacerbation Continue as needed albuterol.  On daily Decadron.  Type 2 diabetes mellitus, controlled Continue sliding scale coverage.  Monitor for hypoglycemia while on steroid.  Chronic kidney disease stage IV Renal function at baseline.  Monitor closely.  Continue hydration.  Essential hypertension Continue midodrine.  Added Cardizem.  Continue hydralazine, will  resume Aldactone if renal function stable in a.m. lab.  Coreg held as patient is on Cardizem and heart rate dropping to the 50s.  Hepatic cirrhosis, compensated Monitor for now.  Code Status : Full code  Family Communication  : None at bedside  Disposition Plan  : Home possibly in the next 48 hours if heart rate and H&H stable.  Barriers For Discharge : Active symptoms  Consults  : Cardiology  Procedures  : 2D echo  DVT Prophylaxis  : SCDs  Lab Results  Component Value Date   PLT 129 (L) 04/30/2019    Antibiotics  : None  Anti-infectives (From admission, onward)   None        Objective:   Vitals:   04/29/19 2228 04/30/19 0353 04/30/19 0355 04/30/19 0755  BP: (!) 150/78 (!) 177/105 (!) 181/95 (!) 154/82  Pulse: 60 60 (!) 58 (!) 58  Resp: 16 18  18   Temp: 97.9 F (36.6 C) (!) 97.3 F (36.3 C)  (!) 97.5 F (36.4 C)  TempSrc: Oral Oral  Oral  SpO2: 97% 100%  96%  Weight:      Height:        Wt Readings from Last 3 Encounters:  04/28/19 83.9 kg  04/19/19 81 kg  04/14/19 81.6 kg     Intake/Output Summary (Last 24 hours) at 04/30/2019 1343 Last data filed at 04/30/2019 0757 Gross per 24 hour  Intake 1774 ml  Output 1025 ml  Net 749 ml    Physical exam Not in distress HEENT: Pallor present, moist mucosa, supple neck Chest: Clear CVS: S1-S2 regular, no murmurs GI: Soft, nondistended, nontender Musculoskeletal: Warm, no edema     Data Review:    CBC Recent Labs  Lab 04/28/19 2113 04/29/19 0050 04/30/19 0620  WBC 13.7* 10.5 4.6  HGB 7.7* 6.8* 8.7*  HCT 23.7* 21.3* 26.5*  PLT 126* 102* 129*  MCV 89.4 91.4 86.9  MCH 29.1 29.2  28.5  MCHC 32.5 31.9 32.8  RDW 13.7 13.9 13.3    Chemistries  Recent Labs  Lab 04/28/19 2113 04/29/19 0050  NA 130* 132*  K 4.4 4.4  CL 100 103  CO2 19* 20*  GLUCOSE 134* 143*  BUN 53* 53*  CREATININE 3.70* 3.95*  CALCIUM 7.7* 7.3*  MG 1.4*  --     ------------------------------------------------------------------------------------------------------------------ Recent Labs    04/29/19 0050  CHOL 124  HDL 22*  LDLCALC 73  TRIG 145  CHOLHDL 5.6    Lab Results  Component Value Date   HGBA1C 5.1 04/20/2019   ------------------------------------------------------------------------------------------------------------------ Recent Labs    04/29/19 0049  TSH 2.820   ------------------------------------------------------------------------------------------------------------------ Recent Labs    04/29/19 0049 04/29/19 1509  VITAMINB12  --  448  FERRITIN 297 448*  TIBC  --  168*  IRON  --  24*    Coagulation profile No results for input(s): INR, PROTIME in the last 168 hours.  No results for input(s): DDIMER in the last 72 hours.  Cardiac Enzymes No results for input(s): CKMB, TROPONINI, MYOGLOBIN in the last 168 hours.  Invalid input(s): CK ------------------------------------------------------------------------------------------------------------------    Component Value Date/Time   BNP 547.0 (H) 04/19/2019 1957    Inpatient Medications  Scheduled Meds: . albuterol  2 puff Inhalation QID  . dexamethasone (DECADRON) injection  6 mg Intravenous q1800  . diltiazem  30 mg Oral Q6H  . hydrALAZINE  25 mg Oral Q8H   Continuous Infusions: . sodium chloride 75 mL/hr at 04/30/19 0609   PRN Meds:.acetaminophen, ALPRAZolam, ondansetron (ZOFRAN) IV, zolpidem  Micro Results No results found for this or any previous visit (from the past 240 hour(s)).  Radiology Reports DG Chest 2 View  Result Date: 04/28/2019 CLINICAL DATA:  Body aches EXAM: CHEST - 2 VIEW COMPARISON:  April 19, 2019 FINDINGS: There are persistent but improved peripheral bilateral airspace opacities. The heart size remains borderline enlarged. There is no pneumothorax. No large pleural effusion. There is no acute osseous abnormality. No significant  pleural effusion. IMPRESSION: Improving bilateral airspace opacities.  COPD. Electronically Signed   By: Constance Holster M.D.   On: 04/28/2019 21:51   DG Chest 2 View  Result Date: 04/14/2019 CLINICAL DATA:  Dyspnea on exertion for 1 week EXAM: CHEST - 2 VIEW COMPARISON:  04/01/2019 FINDINGS: Cardiac shadow is stable. Aortic calcifications are again seen. The lungs are well aerated bilaterally. No focal infiltrate or sizable effusion is seen. Chronic blunting of the right costophrenic angle is noted. Interval improved aeration in the left base is seen. IMPRESSION: Chronic blunting of the right costophrenic angle. No other focal abnormality is seen. Electronically Signed   By: Inez Catalina M.D.   On: 04/14/2019 15:22   DG Chest 2 View  Result Date: 04/01/2019 CLINICAL DATA:  Shortness of breath EXAM: CHEST - 2 VIEW COMPARISON:  January 21, 2018 FINDINGS: There is mild cardiomegaly. Hyperinflation of the lung zones. Mildly increased hazy airspace opacity seen at the left lung base. There are trace bilateral pleural effusions. Aortic knob calcifications. No acute osseous abnormality. IMPRESSION: Mildly increased hazy airspace opacity at the left lung base which could be due to atelectasis and/or early infectious etiology. Trace bilateral pleural effusions Electronically Signed   By: Prudencio Pair M.D.   On: 04/01/2019 19:05   DG Chest Portable 1 View  Result Date: 04/19/2019 CLINICAL DATA:  Shortness of breath. EXAM: PORTABLE CHEST 1 VIEW COMPARISON:  April 14, 2019 FINDINGS: There is been interval development of  bilateral airspace opacities primarily in the mid lung zones bilaterally. The heart size remains enlarged. There is persistent blunting of the costophrenic angles. There is no acute osseous abnormality. Aortic calcifications are noted. The lungs appear to be somewhat hyperexpanded. IMPRESSION: 1. Interval development of bilateral airspace opacities concerning for developing multifocal pneumonia.  Pulmonary edema can have a similar appearance in the appropriate clinical setting. 2. Hyperexpanded lungs, similar to prior study. 3. Persistent blunting of the costophrenic angles which may be secondary to small bilateral pleural effusions and or atelectasis. Electronically Signed   By: Constance Holster M.D.   On: 04/19/2019 20:06    Time Spent in minutes  35   Dystany Duffy M.D on 04/30/2019 at 1:43 PM  Between 7am to 7pm - Pager - [phone]  After 7pm go to [email or web address] - password [facility]  [facility] -  Office  [phone]

## 2019-04-30 NOTE — Progress Notes (Signed)
Patient Name: DEMARQUIS OSLEY Date of Encounter: 04/30/2019  Hospital Problem List     Active Problems:   Atrial flutter with rapid ventricular response [facility])    Patient Profile     69 year old male with history of chronic anemia, CKD stage IV, squamous cell carcinoma, cirrhosis, hyperlipidemia, diabetes and hypertension admitted with dyspnea and weakness.  Echocardiogram in 2018 showed normal LV function.  He was tested Covid positive.  January 10 of 2021.  He was noted to have progressive weakness and presented to the emergency room where he had a temperature of 99.1.  Complains of anorexia.  Initial EKG showed sinus rhythm however subsequent EKG showed atrial flutter with 2-1 to 4-1 block.  He was noted to be anemic with initial hemoglobin of 7.7 .  He is in sinus rhythm.  Subjective   Generalized fatigue and body aches diffusely  Inpatient Medications    . albuterol  2 puff Inhalation QID  . dexamethasone (DECADRON) injection  6 mg Intravenous q1800  . diltiazem  30 mg Oral Q6H  . hydrALAZINE  25 mg Oral Q8H    Vital Signs    Vitals:   04/29/19 1913 04/29/19 2228 04/30/19 0353 04/30/19 0355  BP: 127/62 (!) 150/78 (!) 177/105 (!) 181/95  Pulse: (!) 58 60 60 (!) 58  Resp: 18 16 18    Temp: 97.8 F (36.6 C) 97.9 F (36.6 C) (!) 97.3 F (36.3 C)   TempSrc: Oral Oral Oral   SpO2: 99% 97% 100%   Weight:      Height:        Intake/Output Summary (Last 24 hours) at 04/30/2019 0730 Last data filed at 04/30/2019 8144 Gross per 24 hour  Intake 2134 ml  Output 1300 ml  Net 834 ml   Filed Weights   04/28/19 2109  Weight: 83.9 kg    Physical Exam    GEN: Well nourished, well developed, in no acute distress.  HEENT: normal.  Neck: Supple, no JVD, carotid bruits, or masses. Cardiac: RRR, no murmurs, rubs, or gallops. No clubbing, cyanosis, edema.  Radials/DP/PT 2+ and equal bilaterally.  Respiratory:  Respirations regular and unlabored, clear to auscultation  bilaterally. GI: Soft, nontender, nondistended, BS + x 4. MS: no deformity or atrophy. Skin: warm and dry, no rash. Neuro:  Strength and sensation are intact. Psych: Normal affect.  Labs    CBC Recent Labs    04/29/19 0050 04/30/19 0620  WBC 10.5 4.6  HGB 6.8* 8.7*  HCT 21.3* 26.5*  MCV 91.4 86.9  PLT 102* 818*   Basic Metabolic Panel Recent Labs    04/28/19 2113 04/29/19 0050  NA 130* 132*  K 4.4 4.4  CL 100 103  CO2 19* 20*  GLUCOSE 134* 143*  BUN 53* 53*  CREATININE 3.70* 3.95*  CALCIUM 7.7* 7.3*  MG 1.4*  --    Liver Function Tests No results for input(s): AST, ALT, ALKPHOS, BILITOT, PROT, ALBUMIN in the last 72 hours. No results for input(s): LIPASE, AMYLASE in the last 72 hours. Cardiac Enzymes No results for input(s): CKTOTAL, CKMB, CKMBINDEX, TROPONINI in the last 72 hours. BNP No results for input(s): BNP in the last 72 hours. D-Dimer No results for input(s): DDIMER in the last 72 hours. Hemoglobin A1C No results for input(s): HGBA1C in the last 72 hours. Fasting Lipid Panel Recent Labs    04/29/19 0050  CHOL 124  HDL 22*  LDLCALC 73  TRIG 145  CHOLHDL 5.6   Thyroid Function  Tests Recent Labs    04/29/19 0049  TSH 2.820    Telemetry    Sinus rhythm with intermittent runs of SVT consistent with probable atrial flutter  ECG    Atrial flutter  Radiology    DG Chest 2 View  Result Date: 04/28/2019 CLINICAL DATA:  Body aches EXAM: CHEST - 2 VIEW COMPARISON:  April 19, 2019 FINDINGS: There are persistent but improved peripheral bilateral airspace opacities. The heart size remains borderline enlarged. There is no pneumothorax. No large pleural effusion. There is no acute osseous abnormality. No significant pleural effusion. IMPRESSION: Improving bilateral airspace opacities.  COPD. Electronically Signed   By: Constance Holster M.D.   On: 04/28/2019 21:51   DG Chest 2 View  Result Date: 04/14/2019 CLINICAL DATA:  Dyspnea on exertion  for 1 week EXAM: CHEST - 2 VIEW COMPARISON:  04/01/2019 FINDINGS: Cardiac shadow is stable. Aortic calcifications are again seen. The lungs are well aerated bilaterally. No focal infiltrate or sizable effusion is seen. Chronic blunting of the right costophrenic angle is noted. Interval improved aeration in the left base is seen. IMPRESSION: Chronic blunting of the right costophrenic angle. No other focal abnormality is seen. Electronically Signed   By: Inez Catalina M.D.   On: 04/14/2019 15:22   DG Chest 2 View  Result Date: 04/01/2019 CLINICAL DATA:  Shortness of breath EXAM: CHEST - 2 VIEW COMPARISON:  January 21, 2018 FINDINGS: There is mild cardiomegaly. Hyperinflation of the lung zones. Mildly increased hazy airspace opacity seen at the left lung base. There are trace bilateral pleural effusions. Aortic knob calcifications. No acute osseous abnormality. IMPRESSION: Mildly increased hazy airspace opacity at the left lung base which could be due to atelectasis and/or early infectious etiology. Trace bilateral pleural effusions Electronically Signed   By: Prudencio Pair M.D.   On: 04/01/2019 19:05   DG Chest Portable 1 View  Result Date: 04/19/2019 CLINICAL DATA:  Shortness of breath. EXAM: PORTABLE CHEST 1 VIEW COMPARISON:  April 14, 2019 FINDINGS: There is been interval development of bilateral airspace opacities primarily in the mid lung zones bilaterally. The heart size remains enlarged. There is persistent blunting of the costophrenic angles. There is no acute osseous abnormality. Aortic calcifications are noted. The lungs appear to be somewhat hyperexpanded. IMPRESSION: 1. Interval development of bilateral airspace opacities concerning for developing multifocal pneumonia. Pulmonary edema can have a similar appearance in the appropriate clinical setting. 2. Hyperexpanded lungs, similar to prior study. 3. Persistent blunting of the costophrenic angles which may be secondary to small bilateral pleural  effusions and or atelectasis. Electronically Signed   By: Constance Holster M.D.   On: 04/19/2019 20:06    Assessment & Plan     1.  Atrial flutter              -Etiology likely multifactorial to include profound anemia as well as recovering from Covid infection.  He is converted back to sinus rhythm though does have brief runs of SVT.   He is currently on Cardizem 30 every 6 with predominant rhythm of sinus.  Would continue follow-up for sustained breakthrough arrhythmias.    He appears to be somewhat bradycardic when in sinus rhythm.  Would continue with this dose for now Not an anticoagulation candidate until he is cause for blood loss is noted.   Echocardiogram to evaluate LV function.  Previous echo several years ago was unremarkable for significant structural heart disease.  2.  Covid positive              -  Currently recovering from this.  Likely partly responsible for his atrial arrhythmia.  Airborne isolation  3.  CKD stage IV              -Creatinine appears to be at his baseline.    No evidence of heart failure clinically or by chest x-ray  4.  Anemia              -Etiology unclear.    Hemoglobin 8.7 this a.m.  We will continue to follow.  5.  Hypertension              -We will continue with Cardizem and follow his pressure.  Was on amlodipine at 10 mg daily and carvedilol 3.125 mg twice daily, spironolactone 25 mg twice daily as an outpatient.  We will hold on the carvedilol since he is getting Cardizem and is relatively bradycardic.  Continue with hydralazine at 25 3 times daily with consideration based on his hemodynamics today of increasing dose or adding spironolactone. Consider adding spironolactone 25 twice daily back as renal function and hemodynamics stabilize.    Signed, Javier Docker Benjamen Koelling MD 04/30/2019, 7:30 AM  Pager: [phone]

## 2019-04-30 NOTE — Consult Note (Addendum)
ANTICOAGULATION CONSULT NOTE - Initial Consult  Pharmacy Consult for heparin Indication: atrial fibrillation  No Known Allergies  Patient Measurements: Height: 5\' 10"  (177.8 cm) Weight: 185 lb (83.9 kg) IBW/kg (Calculated) : 73 Heparin Dosing Weight: 83.9 kg  Vital Signs: Temp: 97.5 F (36.4 C) (01/21 0755) Temp Source: Oral (01/21 0755) BP: 154/82 (01/21 0755) Pulse Rate: 58 (01/21 0755)  Labs: Recent Labs    04/28/19 2113 04/28/19 2113 04/29/19 0050 04/30/19 0620  HGB 7.7*   < > 6.8* 8.7*  HCT 23.7*  --  21.3* 26.5*  PLT 126*  --  102* 129*  CREATININE 3.70*  --  3.95*  --   TROPONINIHS 24*  --   --   --    < > = values in this interval not displayed.    Estimated Creatinine Clearance: 19 mL/min (A) (by C-G formula based on SCr of 3.95 mg/dL (H)).   Medical History: Past Medical History:  Diagnosis Date  . Anemia   . 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 CHF (congestive heart failure) ([place])   . Chronic kidney disease   . COPD (chronic obstructive pulmonary disease) ([place])    NO INHALERS  . Diabetes mellitus    NO MEDS  . GERD (gastroesophageal reflux disease)   . Hepatic cirrhosis ([place])   . Hypertension   . Leg swelling   . Loose, teeth    PATIENT STATES "HAS 6 TEETH, SOME ARE LOOSE"  . Neuromuscular disorder ([place])    RIGHT HAND AND FINGERS NUMB  . Shortness of breath dyspnea    WITH EXERTION   . 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 albuterol (VENTOLIN HFA) 108 (90 Base) MCG/ACT inhaler Inhale 2 puffs into the lungs every 6 (six) hours as needed for wheezing or shortness of breath.   Unknown at PRN  . allopurinol (ZYLOPRIM) 100 MG tablet Take 100 mg by mouth daily.   24+ hours at Unknown  . amLODipine (NORVASC) 10 MG tablet Take 10 mg by mouth daily.    24+ hours at  Unknown  . ascorbic acid (VITAMIN C) 500 MG tablet Take 1 tablet (500 mg total) by mouth daily. 30 tablet 0 24+ hours at Unknown  . betamethasone dipropionate 0.05 % cream Apply topically 2 (two) times daily. 45 g 3 24+ hours at Unknown  . carvedilol (COREG) 3.125 MG tablet Take 3.125 mg by mouth 2 (two) times daily.   24+ hours at Unknown  . DULERA 200-5 MCG/ACT AERO Inhale 1 puff into the lungs 2 (two) times daily.   24+ hours at Unknown  . ferrous sulfate 325 (65 FE) MG tablet Take 325 mg by mouth daily.    24+ hours at Unknown  . gentamicin cream (GARAMYCIN) 0.1 % Apply 1 application topically 2 (two) times daily. 45 g 1 24+ hours at Unknown  . hydrALAZINE (APRESOLINE) 100 MG tablet Take 100 mg by mouth 3 (three) times daily.    24+ hours at Unknown  . omeprazole (PRILOSEC) 40 MG capsule Take 40 mg by mouth every morning.    24+ hours at Unknown  . polyethylene glycol (MIRALAX / GLYCOLAX) packet Take 17 g by mouth daily as needed for mild constipation.    Unknown at PRN  . sodium bicarbonate 650 MG tablet Take 1 tablet (650 mg total) by  mouth 3 (three) times daily. 20 tablet 0 24+ hours at Unknown  . SPIRIVA RESPIMAT 1.25 MCG/ACT AERS Inhale 1 spray into the lungs daily.   24+ hours at Unknown  . torsemide (DEMADEX) 10 MG tablet Take 10 mg by mouth daily.    24+ hours at Unknown  . vitamin B-12 (CYANOCOBALAMIN) 1000 MCG tablet Take 1 tablet (1,000 mcg total) by mouth daily. 30 tablet 3   . zinc sulfate 220 (50 Zn) MG capsule Take 1 capsule (220 mg total) by mouth daily. 30 capsule 0    Scheduled:  . albuterol  2 puff Inhalation QID  . dexamethasone (DECADRON) injection  6 mg Intravenous q1800  . diltiazem  30 mg Oral Q6H  . hydrALAZINE  25 mg Oral Q8H   Infusions:  . sodium chloride 75 mL/hr at 04/30/19 0609   PRN: acetaminophen, ALPRAZolam, ondansetron (ZOFRAN) IV, zolpidem Anti-infectives (From admission, onward)   None      Assessment: Pharmacy consulted to start heparin for  afib. Pt received a dose of apixaban on 1/20 @1142 . Baseline labs ordered. Pt only received one dose, doubt it will have much interference. There was a drop in hgb on 1/20 but currently up to 8.7 after transfusion on 1/20.    Goal of Therapy:  Heparin level 0.3-0.7 units/ml aPTT 66-102 seconds Monitor platelets by anticoagulation protocol: Yes   Plan:  Give 2000 units bolus x 1 Start heparin infusion at 1150 units/hr Check anti-Xa and aPTT level in 8 hours and daily while on heparin. If heparin is not falsely elevated, continue with HL monitoring.  Continue to monitor H&H and platelets  Oswald Hillock, PharmD, BCPS 04/30/2019,2:22 PM

## 2019-05-01 LAB — BASIC METABOLIC PANEL
Anion gap: 10 (ref 5–15)
BUN: 79 mg/dL — ABNORMAL HIGH (ref 8–23)
CO2: 18 mmol/L — ABNORMAL LOW (ref 22–32)
Calcium: 7.1 mg/dL — ABNORMAL LOW (ref 8.9–10.3)
Chloride: 102 mmol/L (ref 98–111)
Creatinine, Ser: 3.66 mg/dL — ABNORMAL HIGH (ref 0.61–1.24)
GFR calc Af Amer: 19 mL/min — ABNORMAL LOW (ref >60)
GFR calc non Af Amer: 16 mL/min — ABNORMAL LOW (ref >60)
Glucose, Bld: 410 mg/dL — ABNORMAL HIGH (ref 70–99)
Potassium: 4.5 mmol/L (ref 3.5–5.1)
Sodium: 130 mmol/L — ABNORMAL LOW (ref 135–145)

## 2019-05-01 LAB — CBC
HCT: 23.5 % — ABNORMAL LOW (ref 39.0–52.0)
HCT: 23.3 % — ABNORMAL LOW (ref 39.0–52.0)
Hemoglobin: 7.9 g/dL — ABNORMAL LOW (ref 13.0–17.0)
Hemoglobin: 7.8 g/dL — ABNORMAL LOW (ref 13.0–17.0)
MCH: 29.4 pg (ref 26.0–34.0)
MCH: 29.2 pg (ref 26.0–34.0)
MCHC: 33.6 g/dL (ref 30.0–36.0)
MCHC: 33.5 g/dL (ref 30.0–36.0)
MCV: 87.4 fL (ref 80.0–100.0)
MCV: 87.3 fL (ref 80.0–100.0)
Platelets: 123 10*3/uL — ABNORMAL LOW (ref 150–400)
Platelets: 122 10*3/uL — ABNORMAL LOW (ref 150–400)
RBC: 2.69 MIL/uL — ABNORMAL LOW (ref 4.22–5.81)
RBC: 2.67 MIL/uL — ABNORMAL LOW (ref 4.22–5.81)
RDW: 13.3 % (ref 11.5–15.5)
RDW: 13.3 % (ref 11.5–15.5)
WBC: 8.2 10*3/uL (ref 4.0–10.5)
WBC: 8.3 10*3/uL (ref 4.0–10.5)
nRBC: 0 % (ref 0.0–0.2)
nRBC: 0 % (ref 0.0–0.2)

## 2019-05-01 LAB — APTT
aPTT: 39 seconds — ABNORMAL HIGH (ref 24–36)
aPTT: 50 seconds — ABNORMAL HIGH (ref 24–36)
aPTT: 74 seconds — ABNORMAL HIGH (ref 24–36)

## 2019-05-01 LAB — MAGNESIUM: Magnesium: 2 mg/dL (ref 1.7–2.4)

## 2019-05-01 LAB — HEPARIN LEVEL (UNFRACTIONATED)
Heparin Unfractionated: 0.97 IU/mL — ABNORMAL HIGH (ref 0.30–0.70)
Heparin Unfractionated: 0.97 IU/mL — ABNORMAL HIGH (ref 0.30–0.70)

## 2019-05-01 LAB — GLUCOSE, CAPILLARY
Glucose-Capillary: 383 mg/dL — ABNORMAL HIGH (ref 70–99)
Glucose-Capillary: 326 mg/dL — ABNORMAL HIGH (ref 70–99)

## 2019-05-01 MED ORDER — HYDRALAZINE HCL 50 MG PO TABS
50.0000 mg | ORAL_TABLET | Freq: Three times a day (TID) | ORAL | Status: DC
  Administered 2019-05-01 – 2019-05-03 (×6): 50 mg via ORAL
  Filled 2019-05-01 (×6): qty 1

## 2019-05-01 MED ORDER — INSULIN ASPART 100 UNIT/ML ~~LOC~~ SOLN
0.0000 [IU] | Freq: Every day | SUBCUTANEOUS | Status: DC
  Administered 2019-05-01: 4 [IU] via SUBCUTANEOUS
  Administered 2019-05-02: 2 [IU] via SUBCUTANEOUS
  Filled 2019-05-01 (×2): qty 1

## 2019-05-01 MED ORDER — HEPARIN BOLUS VIA INFUSION
1000.0000 [IU] | Freq: Once | INTRAVENOUS | Status: AC
  Administered 2019-05-01: 1000 [IU] via INTRAVENOUS
  Filled 2019-05-01: qty 1000

## 2019-05-01 MED ORDER — HEPARIN BOLUS VIA INFUSION
2000.0000 [IU] | Freq: Once | INTRAVENOUS | Status: AC
  Administered 2019-05-01: 2000 [IU] via INTRAVENOUS
  Filled 2019-05-01: qty 2000

## 2019-05-01 MED ORDER — INSULIN ASPART 100 UNIT/ML ~~LOC~~ SOLN
0.0000 [IU] | Freq: Three times a day (TID) | SUBCUTANEOUS | Status: DC
  Administered 2019-05-01: 5 [IU] via SUBCUTANEOUS
  Administered 2019-05-02: 1 [IU] via SUBCUTANEOUS
  Administered 2019-05-02: 2 [IU] via SUBCUTANEOUS
  Administered 2019-05-02: 1 [IU] via SUBCUTANEOUS
  Filled 2019-05-01 (×4): qty 1

## 2019-05-01 NOTE — Progress Notes (Signed)
Hgb dropped over night.  Patient on heparin gtt.  Sent text via secure chat to Dr. Clementeen Graham to see if he wants to continue heparin gtt or stop it.  Patient has had no obvious signs of bleeding.

## 2019-05-01 NOTE — Progress Notes (Signed)
PROGRESS NOTE                                                                                                                                                                                                             Patient Demographics:    [name], is a 69 y.o. male, DOB - 1950/04/12, EVO:[id number]  Admit date - 04/28/2019   Admitting Physician Christel Mormon, MD  Outpatient Primary MD for the patient is [name], MD  LOS - 3  Outpatient Specialists: None  Chief Complaint  Patient presents with  . COVID       Brief Narrative   69 year old male with history of COPD, not on O2, type 2 diabetes mellitus, CKD stage IV, cirrhosis, IgA MGUS, anemia of chronic disease/iron deficiency, hypertension presented to the ED with generalized body ache and malaise.  He was hospitalized 10 days back for COVID-19 and discharged home on 1/13 with plan on outpatient remdesivir infusion.  He however did not follow-up at the infusion center (had 2 doses remaining, stating that his wife was not feeling well and he could not go there).  Patient reported diffuse body ache, nausea and vomiting but no abdominal pain or diarrhea.  Denies any fevers or chills, dyspnea or cough.  Reported poor p.o. intake. In the ED he was afebrile, found to be in a flutter with RVR and prolonged QTC of 542.  TSH of 2.8.  He is D-dimer was 1900, ferritin of 297 and LDH of 144.  Chemistry showed BUN of 53 and creatinine 3.7 (improved from previously).  His magnesium was 1.4 and high-sensitivity of 24.  He had WBC of 13.6 and hemoglobin of 7.7.  Chest x-ray showed improved bibasilar opacities compared to recent hospitalization. Patient given IV and p.o. Lopressor, IV magnesium sulfate following which his heart rate improved to sinus rhythm. Admitted for further management.    Subjective:   Feels better.  Heart rate stable on the monitor.  Noted for some drop in  H&H.  Assessment  & Plan :    Active Problems: New onset atrial flutter with rapid ventricular response (HCC) Heart rate stable on current Cardizem dose.  Will transition to long-acting Cardizem if remains stable. Started on IV heparin after discussion with GI.  Noted for some drop in H&H.  Will monitor H&H closely  and if concern for GI bleed and further drop, consult GI Cardiology consult appreciated.  2D echo with normal EF of 60-65% and no wall motion abnormality.     Active symptoms Recent covid 19 Did not go for outpatient remdesivir infusion stating he was weak and his wife was sick as well.  Currently no respiratory issues.  Had nausea and vomiting prior to admission which has resolved.  On Decadron 4 COPD.Marland Kitchen  Both fibrin derivative D-dimer and CRP elevated however respiratory function is stable.  Further remdesivir or Actemra not needed.  Continue precautions.   Anemia of chronic disease/iron deficient anemia Baseline hemoglobin of 8, dropped to 6.8, received 1 unit PRBC.  On iron supplement. Patient had EGD and colonoscopy in 2018 for nonbleeding AVMs angiectasia and diverticulosis without bleeding.     Discussed with GI ([name]).  Given his chronic anemia with no signs of acute bleeding recommends to start him on IV heparin and monitor.  Given his recent COVID-19 infection it would be difficult to perform EGD/colonoscopy at present.  Recommends to monitor with IV heparin and if H&H stable, to continue long-term anticoagulation.  COPD with mild exacerbation Continue as needed albuterol.  Off Decadron.  Type 2 diabetes mellitus, uncontrolled with hyperglycemia Continue sliding scale coverage.  Noted for elevated blood glucose on a.m. lab.  Placed on sliding scale coverage.  He is now off Decadron.  Chronic kidney disease stage IV Renal function at baseline.  Monitor closely.  Continue hydration.  Essential hypertension Continue midodrine.  Added Cardizem.  Continue  hydralazine, will resume Aldactone if renal function stable in a.m. lab.  Coreg held as patient is on Cardizem and heart rate dropping to the 50s.  Hepatic cirrhosis, compensated Monitor for now.  Code Status : Full code  Family Communication  : None at bedside.  Asked patient he responds I do not need to contact any family member.  Disposition Plan  : Home possibly in the next 48 hours if heart rate and H&H stable.  Barriers For Discharge : Active symptoms  Consults  : Cardiology  Procedures  : 2D echo  DVT Prophylaxis  : IV heparin  Lab Results  Component Value Date   PLT 123 (L) 05/01/2019    Antibiotics  : None  Anti-infectives (From admission, onward)   None        Objective:   Vitals:   04/30/19 2032 05/01/19 0549 05/01/19 0813 05/01/19 1322  BP: (!) 148/71 (!) 161/81 (!) 156/78 (!) 161/83  Pulse: 61 71 68 62  Resp: 15 17 18    Temp: 97.8 F (36.6 C) 97.7 F (36.5 C) 98.1 F (36.7 C)   TempSrc: Oral Oral Oral   SpO2: 100% 100% 98%   Weight:  88.1 kg    Height:        Wt Readings from Last 3 Encounters:  05/01/19 88.1 kg  04/19/19 81 kg  04/14/19 81.6 kg     Intake/Output Summary (Last 24 hours) at 05/01/2019 1621 Last data filed at 05/01/2019 1254 Gross per 24 hour  Intake --  Output 700 ml  Net -700 ml   Physical exam Not in distress, fatigue HEENT: Moist mucosa, supple neck Chest: Clear bilaterally CVs: Normal S1-S2, no murmurs GI: Soft, nondistended, nontender Musculoskeletal: Warm, no edema      Data Review:    CBC Recent Labs  Lab 04/28/19 2113 04/29/19 0050 04/30/19 0620 05/01/19 0711  WBC 13.7* 10.5 4.6 8.2  HGB 7.7* 6.8* 8.7* 7.9*  HCT 23.7* 21.3* 26.5* 23.5*  PLT 126* 102* 129* 123*  MCV 89.4 91.4 86.9 87.4  MCH 29.1 29.2 28.5 29.4  MCHC 32.5 31.9 32.8 33.6  RDW 13.7 13.9 13.3 13.3    Chemistries  Recent Labs  Lab 04/28/19 2113 04/29/19 0050 05/01/19 0711  NA 130* 132* 130*  K 4.4 4.4 4.5  CL 100 103 102   CO2 19* 20* 18*  GLUCOSE 134* 143* 410*  BUN 53* 53* 79*  CREATININE 3.70* 3.95* 3.66*  CALCIUM 7.7* 7.3* 7.1*  MG 1.4*  --  2.0   ------------------------------------------------------------------------------------------------------------------ Recent Labs    04/29/19 0050  CHOL 124  HDL 22*  LDLCALC 73  TRIG 145  CHOLHDL 5.6    Lab Results  Component Value Date   HGBA1C 5.1 04/20/2019   ------------------------------------------------------------------------------------------------------------------ Recent Labs    04/29/19 0049  TSH 2.820   ------------------------------------------------------------------------------------------------------------------ Recent Labs    04/29/19 0049 04/29/19 1509  VITAMINB12  --  448  FERRITIN 297 448*  TIBC  --  168*  IRON  --  24*    Coagulation profile Recent Labs  Lab 04/30/19 1459  INR 1.2    No results for input(s): DDIMER in the last 72 hours.  Cardiac Enzymes No results for input(s): CKMB, TROPONINI, MYOGLOBIN in the last 168 hours.  Invalid input(s): CK ------------------------------------------------------------------------------------------------------------------    Component Value Date/Time   BNP 547.0 (H) 04/19/2019 1957    Inpatient Medications  Scheduled Meds: . albuterol  2 puff Inhalation QID  . diltiazem  30 mg Oral Q6H  . hydrALAZINE  25 mg Oral Q8H  . insulin aspart  0-5 Units Subcutaneous QHS  . insulin aspart  0-6 Units Subcutaneous TID WC   Continuous Infusions: . sodium chloride 75 mL/hr at 05/01/19 1053  . heparin 1,450 Units/hr (05/01/19 1055)   PRN Meds:.acetaminophen, ALPRAZolam, ondansetron (ZOFRAN) IV, zolpidem  Micro Results No results found for this or any previous visit (from the past 240 hour(s)).  Radiology Reports DG Chest 2 View  Result Date: 04/28/2019 CLINICAL DATA:  Body aches EXAM: CHEST - 2 VIEW COMPARISON:  April 19, 2019 FINDINGS: There are persistent but  improved peripheral bilateral airspace opacities. The heart size remains borderline enlarged. There is no pneumothorax. No large pleural effusion. There is no acute osseous abnormality. No significant pleural effusion. IMPRESSION: Improving bilateral airspace opacities.  COPD. Electronically Signed   By: Constance Holster M.D.   On: 04/28/2019 21:51   DG Chest 2 View  Result Date: 04/14/2019 CLINICAL DATA:  Dyspnea on exertion for 1 week EXAM: CHEST - 2 VIEW COMPARISON:  04/01/2019 FINDINGS: Cardiac shadow is stable. Aortic calcifications are again seen. The lungs are well aerated bilaterally. No focal infiltrate or sizable effusion is seen. Chronic blunting of the right costophrenic angle is noted. Interval improved aeration in the left base is seen. IMPRESSION: Chronic blunting of the right costophrenic angle. No other focal abnormality is seen. Electronically Signed   By: Inez Catalina M.D.   On: 04/14/2019 15:22   DG Chest 2 View  Result Date: 04/01/2019 CLINICAL DATA:  Shortness of breath EXAM: CHEST - 2 VIEW COMPARISON:  January 21, 2018 FINDINGS: There is mild cardiomegaly. Hyperinflation of the lung zones. Mildly increased hazy airspace opacity seen at the left lung base. There are trace bilateral pleural effusions. Aortic knob calcifications. No acute osseous abnormality. IMPRESSION: Mildly increased hazy airspace opacity at the left lung base which could be due to atelectasis and/or early infectious etiology. Trace  bilateral pleural effusions Electronically Signed   By: Prudencio Pair M.D.   On: 04/01/2019 19:05   DG Chest Portable 1 View  Result Date: 04/19/2019 CLINICAL DATA:  Shortness of breath. EXAM: PORTABLE CHEST 1 VIEW COMPARISON:  April 14, 2019 FINDINGS: There is been interval development of bilateral airspace opacities primarily in the mid lung zones bilaterally. The heart size remains enlarged. There is persistent blunting of the costophrenic angles. There is no acute osseous  abnormality. Aortic calcifications are noted. The lungs appear to be somewhat hyperexpanded. IMPRESSION: 1. Interval development of bilateral airspace opacities concerning for developing multifocal pneumonia. Pulmonary edema can have a similar appearance in the appropriate clinical setting. 2. Hyperexpanded lungs, similar to prior study. 3. Persistent blunting of the costophrenic angles which may be secondary to small bilateral pleural effusions and or atelectasis. Electronically Signed   By: Constance Holster M.D.   On: 04/19/2019 20:06   ECHOCARDIOGRAM COMPLETE  Result Date: 04/30/2019   ECHOCARDIOGRAM REPORT   Patient Name:   YASSIN SCALES Date of Exam: 04/30/2019 Medical Rec #:  [id number]     Height:       70.0 in Accession #:    [id number]    Weight:       185.0 lb Date of Birth:  [birth year]     BSA:          2.02 m Patient Age:    24 years      BP:           154/82 mmHg Patient Gender: M             HR:           58 bpm. Exam Location:  [facility] Procedure: 2D Echo, Cardiac Doppler and Color Doppler Indications:     Atrial Flutter 427.32  History:         Patient has prior history of Echocardiogram examinations, most                  recent 11/12/2014. COPD, Signs/Symptoms:Shortness of Breath; Risk                  Factors:Diabetes.  Sonographer:     Sherrie Sport RDCS (AE) Referring Phys:  [id number] [facility] Diagnosing Phys: Yolonda Kida MD IMPRESSIONS  1. Left ventricular ejection fraction, by visual estimation, is 60 to 65%. The left ventricle has normal function. Left ventricular septal wall thickness was normal. Normal left ventricular posterior wall thickness. There is no left ventricular hypertrophy.  2. The left ventricle has no regional wall motion abnormalities.  3. Global right ventricle has mildly reduced systolic function.The right ventricular size is mildly enlarged. No increase in right ventricular wall thickness.  4. Left atrial size was mildly dilated.  5. Right atrial size was normal.  6.  The mitral valve is myxomatous. Mild mitral valve regurgitation.  7. The tricuspid valve is normal in structure.  8. The tricuspid valve is normal in structure. Tricuspid valve regurgitation is trivial.  9. The aortic valve is normal in structure. Aortic valve regurgitation is mild. Mild to moderate aortic valve sclerosis/calcification without any evidence of aortic stenosis. 10. The pulmonic valve was grossly normal. Pulmonic valve regurgitation is not visualized. FINDINGS  Left Ventricle: Left ventricular ejection fraction, by visual estimation, is 60 to 65%. The left ventricle has normal function. The left ventricle has no regional wall motion abnormalities. Normal left ventricular posterior wall thickness. There is no left ventricular hypertrophy. Right Ventricle:  The right ventricular size is mildly enlarged. No increase in right ventricular wall thickness. Global RV systolic function is has mildly reduced systolic function. Left Atrium: Left atrial size was mildly dilated. Right Atrium: Right atrial size was normal in size Pericardium: There is no evidence of pericardial effusion. Mitral Valve: The mitral valve is myxomatous. Mild mitral valve regurgitation. Tricuspid Valve: The tricuspid valve is normal in structure. Tricuspid valve regurgitation is trivial. Aortic Valve: The aortic valve is normal in structure. Aortic valve regurgitation is mild. Mild to moderate aortic valve sclerosis/calcification is present, without any evidence of aortic stenosis. Aortic valve mean gradient measures 4.0 mmHg. Aortic valve peak gradient measures 6.6 mmHg. Aortic valve area, by VTI measures 2.82 cm. Pulmonic Valve: The pulmonic valve was grossly normal. Pulmonic valve regurgitation is not visualized. Pulmonic regurgitation is not visualized. Aorta: The aortic root is normal in size and structure. IAS/Shunts: No atrial level shunt detected by color flow Doppler.  LEFT VENTRICLE PLAX 2D LVIDd:         4.57 cm  Diastology  LVIDs:         2.41 cm  LV e' lateral:   6.53 cm/s LV PW:         1.13 cm  LV E/e' lateral: 19.4 LV IVS:        1.70 cm  LV e' medial:    5.11 cm/s LVOT diam:     2.00 cm  LV E/e' medial:  24.9 LV SV:         75 ml LV SV Index:   36.84 LVOT Area:     3.14 cm  RIGHT VENTRICLE RV Basal diam:  4.40 cm RV S prime:     12.40 cm/s TAPSE (M-mode): 3.5 cm LEFT ATRIUM             Index       RIGHT ATRIUM           Index LA diam:        4.50 cm 2.23 cm/m  RA Area:     21.40 cm LA Vol (A2C):   76.2 ml 37.74 ml/m RA Volume:   68.80 ml  34.07 ml/m LA Vol (A4C):   47.8 ml 23.67 ml/m LA Biplane Vol: 64.6 ml 31.99 ml/m  AORTIC VALVE                   PULMONIC VALVE AV Area (Vmax):    2.69 cm    PV Vmax:        0.99 m/s AV Area (Vmean):   2.46 cm    PV Peak grad:   3.9 mmHg AV Area (VTI):     2.82 cm    RVOT Peak grad: 5 mmHg AV Vmax:           128.50 cm/s AV Vmean:          89.800 cm/s AV VTI:            0.314 m AV Peak Grad:      6.6 mmHg AV Mean Grad:      4.0 mmHg LVOT Vmax:         110.00 cm/s LVOT Vmean:        70.200 cm/s LVOT VTI:          0.282 m LVOT/AV VTI ratio: 0.90  AORTA Ao Root diam: 3.00 cm MITRAL VALVE MV Area (PHT): 1.96 cm              SHUNTS MV  PHT:        112.23 msec           Systemic VTI:  0.28 m MV Decel Time: 387 msec              Systemic Diam: 2.00 cm MV E velocity: 127.00 cm/s 103 cm/s MV A velocity: 106.00 cm/s 70.3 cm/s MV E/A ratio:  1.20        1.5  Dwayne Prince Rome MD Electronically signed by Yolonda Kida MD Signature Date/Time: 04/30/2019/1:59:29 PM    Final     Time Spent in minutes  35   Shakoya Gilmore M.D on 05/01/2019 at 4:21 PM  Between 7am to 7pm - Pager - [phone]  After 7pm go to [email or web address] - password [facility]  [facility] -  Office  [phone]

## 2019-05-01 NOTE — Plan of Care (Signed)
  Problem: Education: Goal: Knowledge of General Education information will improve Description Including pain rating scale, medication(s)/side effects and non-pharmacologic comfort measures Outcome: Progressing   Problem: Clinical Measurements: Goal: Cardiovascular complication will be avoided Outcome: Progressing   Problem: Safety: Goal: Ability to remain free from injury will improve Outcome: Progressing   

## 2019-05-01 NOTE — Care Management Important Message (Signed)
Important Message  Patient Details  Name: SHAIL URBAS MRN: [id number] Date of Birth: 09-Feb-1951   Medicare Important Message Given:  Yes - Important Message mailed due to current National Emergency  Reviewed verbally over room phone with patient due to isolation.  Copy of Medicare IM sent to address on file.    Dannette Barbara 05/01/2019, 1:21 PM

## 2019-05-01 NOTE — Consult Note (Signed)
ANTICOAGULATION CONSULT NOTE - Initial Consult  Pharmacy Consult for heparin Indication: atrial fibrillation  No Known Allergies  Patient Measurements: Height: 5\' 10"  (177.8 cm) Weight: 185 lb (83.9 kg) IBW/kg (Calculated) : 73 Heparin Dosing Weight: 83.9 kg  Vital Signs: Temp: 97.8 F (36.6 C) (01/21 2032) Temp Source: Oral (01/21 2032) BP: 148/71 (01/21 2032) Pulse Rate: 61 (01/21 2032)  Labs: Recent Labs    04/28/19 2113 04/28/19 2113 04/29/19 0050 04/30/19 0620 04/30/19 1459 04/30/19 2346  HGB 7.7*   < > 6.8* 8.7*  --   --   HCT 23.7*  --  21.3* 26.5*  --   --   PLT 126*  --  102* 129*  --   --   APTT  --   --   --   --  29 50*  LABPROT  --   --   --   --  14.8  --   INR  --   --   --   --  1.2  --   HEPARINUNFRC  --   --   --   --  1.11* 0.97*  CREATININE 3.70*  --  3.95*  --   --   --   TROPONINIHS 24*  --   --   --   --   --    < > = values in this interval not displayed.    Estimated Creatinine Clearance: 19 mL/min (A) (by C-G formula based on SCr of 3.95 mg/dL (H)).   Medical History: Past Medical History:  Diagnosis Date  . Anemia   . 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 CHF (congestive heart failure) ([place])   . Chronic kidney disease   . COPD (chronic obstructive pulmonary disease) ([place])    NO INHALERS  . Diabetes mellitus    NO MEDS  . GERD (gastroesophageal reflux disease)   . Hepatic cirrhosis ([place])   . Hypertension   . Leg swelling   . Loose, teeth    PATIENT STATES "HAS 6 TEETH, SOME ARE LOOSE"  . Neuromuscular disorder ([place])    RIGHT HAND AND FINGERS NUMB  . Shortness of breath dyspnea    WITH EXERTION   . 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 albuterol (VENTOLIN HFA) 108 (90 Base) MCG/ACT inhaler Inhale 2 puffs into the lungs every 6 (six) hours as  needed for wheezing or shortness of breath.   Unknown at PRN  . allopurinol (ZYLOPRIM) 100 MG tablet Take 100 mg by mouth daily.   24+ hours at Unknown  . amLODipine (NORVASC) 10 MG tablet Take 10 mg by mouth daily.    24+ hours at Unknown  . ascorbic acid (VITAMIN C) 500 MG tablet Take 1 tablet (500 mg total) by mouth daily. 30 tablet 0 24+ hours at Unknown  . betamethasone dipropionate 0.05 % cream Apply topically 2 (two) times daily. 45 g 3 24+ hours at Unknown  . carvedilol (COREG) 3.125 MG tablet Take 3.125 mg by mouth 2 (two) times daily.   24+ hours at Unknown  . DULERA 200-5 MCG/ACT AERO Inhale 1 puff into the lungs 2 (two) times daily.   24+ hours at Unknown  . ferrous sulfate 325 (65 FE) MG tablet Take 325 mg by mouth daily.    24+ hours at Unknown  . gentamicin  cream (GARAMYCIN) 0.1 % Apply 1 application topically 2 (two) times daily. 45 g 1 24+ hours at Unknown  . hydrALAZINE (APRESOLINE) 100 MG tablet Take 100 mg by mouth 3 (three) times daily.    24+ hours at Unknown  . omeprazole (PRILOSEC) 40 MG capsule Take 40 mg by mouth every morning.    24+ hours at Unknown  . polyethylene glycol (MIRALAX / GLYCOLAX) packet Take 17 g by mouth daily as needed for mild constipation.    Unknown at PRN  . sodium bicarbonate 650 MG tablet Take 1 tablet (650 mg total) by mouth 3 (three) times daily. 20 tablet 0 24+ hours at Unknown  . SPIRIVA RESPIMAT 1.25 MCG/ACT AERS Inhale 1 spray into the lungs daily.   24+ hours at Unknown  . torsemide (DEMADEX) 10 MG tablet Take 10 mg by mouth daily.    24+ hours at Unknown  . vitamin B-12 (CYANOCOBALAMIN) 1000 MCG tablet Take 1 tablet (1,000 mcg total) by mouth daily. 30 tablet 3   . zinc sulfate 220 (50 Zn) MG capsule Take 1 capsule (220 mg total) by mouth daily. 30 capsule 0    Scheduled:  . albuterol  2 puff Inhalation QID  . dexamethasone (DECADRON) injection  6 mg Intravenous q1800  . diltiazem  30 mg Oral Q6H  . hydrALAZINE  25 mg Oral Q8H    Infusions:  . sodium chloride 75 mL/hr at 04/30/19 1559  . heparin 1,150 Units/hr (04/30/19 1559)   PRN: acetaminophen, ALPRAZolam, ondansetron (ZOFRAN) IV, zolpidem Anti-infectives (From admission, onward)   None      Assessment: Pharmacy consulted to start heparin for afib. Pt received a dose of apixaban on 1/20 @1142 . Baseline labs ordered. Pt only received one dose, doubt it will have much interference. There was a drop in hgb on 1/20 but currently up to 8.7 after transfusion on 1/20.    Goal of Therapy:  Heparin level 0.3-0.7 units/ml aPTT 66-102 seconds Monitor platelets by anticoagulation protocol: Yes   Plan:  1/21 @ 2346 aPTT 50 subtherapeutic (HL 0.97, falsely elevated) on 1150 units/hr.  Will rebolus w/ 1000 units x 1 and increase Heparin infusion to 1300 units/hr.  Recheck aPTT in 8 hours   [employer] A,  05/01/2019,12:35 AM

## 2019-05-01 NOTE — Progress Notes (Signed)
Notify Dr. Clementeen Graham about patient's BP of 177/95, just gave 30 mg of Cardizem p.o, his SBP has been 160's-170's, MD increase patient's hydralazine. Also patient report that he feels weaker, asked MD if we can consult PT, ordered place. Patient is eating and drinking ok, order to discontinue IVF. RN will continue to monitor.

## 2019-05-01 NOTE — Progress Notes (Signed)
ANTICOAGULATION CONSULT NOTE - Follow up [place] for heparin Indication: atrial fibrillation  No Known Allergies  Patient Measurements: Height: 5\' 10"  (177.8 cm) Weight: 194 lb 3.2 oz (88.1 kg) IBW/kg (Calculated) : 73 Heparin Dosing Weight: 83.9 kg  Vital Signs: Temp: 98.1 F (36.7 C) (01/22 0813) Temp Source: Oral (01/22 0813) BP: 156/78 (01/22 0813) Pulse Rate: 68 (01/22 0813)  Labs: Recent Labs    04/28/19 2113 04/28/19 2113 04/29/19 0050 04/29/19 0050 04/30/19 0620 04/30/19 1459 04/30/19 2346 05/01/19 0711  HGB 7.7*   < > 6.8*   < > 8.7*  --   --  7.9*  HCT 23.7*   < > 21.3*  --  26.5*  --   --  23.5*  PLT 126*   < > 102*  --  129*  --   --  123*  APTT  --   --   --   --   --  29 50*  --   LABPROT  --   --   --   --   --  14.8  --   --   INR  --   --   --   --   --  1.2  --   --   HEPARINUNFRC  --   --   --   --   --  1.11* 0.97*  --   CREATININE 3.70*  --  3.95*  --   --   --   --  3.66*  TROPONINIHS 24*  --   --   --   --   --   --   --    < > = values in this interval not displayed.    Estimated Creatinine Clearance: 22.2 mL/min (A) (by C-G formula based on SCr of 3.66 mg/dL (H)).   Assessment: Pharmacy consulted to start heparin for afib. Pt received a dose of apixaban on 1/20 @1142 . Baseline labs ordered. Pt only received one dose, doubt it will have much interference. There was a drop in hgb on 1/20 but currently up to 8.7 after transfusion on 1/20.    1/21 Heparin 2000 units bolus x 1, infusion at 1150 units/hr 1/21 @ 2346 aPTT 50, HL 0.97, rebolus 1000 units x1, incr to 1300 units/hr.   Goal of Therapy:  Heparin level 0.3-0.7 units/ml aPTT 66-102 seconds Monitor platelets by anticoagulation protocol: Yes   Plan:  1/22 @ 0859 aPTT 39 subtherapeutic, HL 0.97, not correlating. RN confirms heparin drip infusing at 13 ml/hr, no line issues or interruptions known, no obvious s/sx of bleeding.  Will bolus heparin 2000 units IV x1  then increase drip to 1450 units/hr. Recheck aPTT in 8 hours CBC in AM  Pharmacy will continue to follow   Rocky Morel,  05/01/2019,9:42 AM

## 2019-05-01 NOTE — Progress Notes (Addendum)
ANTICOAGULATION CONSULT NOTE - Follow up [facility] for heparin Indication: atrial fibrillation  No Known Allergies  Patient Measurements: Height: 5\' 10"  (177.8 cm) Weight: 194 lb 3.2 oz (88.1 kg) IBW/kg (Calculated) : 73 Heparin Dosing Weight: 83.9 kg  Vital Signs: Temp: 97.6 F (36.4 C) (01/22 2015) Temp Source: Oral (01/22 2015) BP: 156/83 (01/22 2015) Pulse Rate: 60 (01/22 2015)  Labs: Recent Labs    04/28/19 2113 04/28/19 2113 04/29/19 0050 04/29/19 0050 04/30/19 0620 04/30/19 0620 04/30/19 1459 04/30/19 1459 04/30/19 2346 05/01/19 0711 05/01/19 0859 05/01/19 1702  HGB 7.7*   < > 6.8*   < > 8.7*   < >  --   --   --  7.9*  --  7.8*  HCT 23.7*   < > 21.3*   < > 26.5*  --   --   --   --  23.5*  --  23.3*  PLT 126*   < > 102*   < > 129*  --   --   --   --  123*  --  122*  APTT  --   --   --   --   --   --  29   < > 50*  --  39* 74*  LABPROT  --   --   --   --   --   --  14.8  --   --   --   --   --   INR  --   --   --   --   --   --  1.2  --   --   --   --   --   HEPARINUNFRC  --   --   --   --   --   --  1.11*  --  0.97*  --  0.97*  --   CREATININE 3.70*  --  3.95*  --   --   --   --   --   --  3.66*  --   --   TROPONINIHS 24*  --   --   --   --   --   --   --   --   --   --   --    < > = values in this interval not displayed.    Estimated Creatinine Clearance: 22.2 mL/min (A) (by C-G formula based on SCr of 3.66 mg/dL (H)).   Assessment: Pharmacy consulted to start heparin for afib. Pt received a dose of apixaban on 1/20 @1142 . Baseline labs ordered. Pt only received one dose, doubt it will have much interference. There was a drop in hgb on 1/20 but currently up to 8.7 after transfusion on 1/20.    1/21 Heparin 2000 units bolus x 1, infusion at 1150 units/hr 1/21 @ 2346 aPTT 50, HL 0.97, rebolus 1000 units x1, incr to 1300 units/hr. 1/22 @ 0859 aPTT 39 subtherapeutic, HL 0.97, not correlating. RN confirms heparin drip infusing at 13 ml/hr, no  line issues or interruptions known, no obvious s/sx of bleeding.  Will bolus heparin 2000 units IV x1 then increase drip to 1450 units/hr.    Goal of Therapy:  Heparin level 0.3-0.7 units/ml aPTT 66-102 seconds Monitor platelets by anticoagulation protocol: Yes   Plan:  1/22@1702 : APTT 72 sec. Currently therapeutic. Will continue infusion at 1450 units/hr. Hgb and Platelets remain stable.  Recheck aPTT/HL in 8 hours. CBC in AM  Pharmacy will continue to  follow   [name],  05/01/2019,8:28 PM

## 2019-05-02 DIAGNOSIS — I10 Essential (primary) hypertension: Secondary | ICD-10-CM

## 2019-05-02 LAB — GLUCOSE, CAPILLARY
Glucose-Capillary: 208 mg/dL — ABNORMAL HIGH (ref 70–99)
Glucose-Capillary: 174 mg/dL — ABNORMAL HIGH (ref 70–99)
Glucose-Capillary: 164 mg/dL — ABNORMAL HIGH (ref 70–99)
Glucose-Capillary: 217 mg/dL — ABNORMAL HIGH (ref 70–99)

## 2019-05-02 LAB — CBC
HCT: 24.5 % — ABNORMAL LOW (ref 39.0–52.0)
Hemoglobin: 8.1 g/dL — ABNORMAL LOW (ref 13.0–17.0)
MCH: 28.9 pg (ref 26.0–34.0)
MCHC: 33.1 g/dL (ref 30.0–36.0)
MCV: 87.5 fL (ref 80.0–100.0)
Platelets: 139 10*3/uL — ABNORMAL LOW (ref 150–400)
RBC: 2.8 MIL/uL — ABNORMAL LOW (ref 4.22–5.81)
RDW: 13.3 % (ref 11.5–15.5)
WBC: 9.7 10*3/uL (ref 4.0–10.5)
nRBC: 0 % (ref 0.0–0.2)

## 2019-05-02 LAB — HEPARIN LEVEL (UNFRACTIONATED)
Heparin Unfractionated: 0.53 IU/mL (ref 0.30–0.70)
Heparin Unfractionated: 0.49 IU/mL (ref 0.30–0.70)

## 2019-05-02 LAB — APTT: aPTT: 68 seconds — ABNORMAL HIGH (ref 24–36)

## 2019-05-02 MED ORDER — POLYETHYLENE GLYCOL 3350 17 G PO PACK
17.0000 g | PACK | Freq: Every day | ORAL | Status: DC
  Administered 2019-05-02 – 2019-05-03 (×2): 17 g via ORAL
  Filled 2019-05-02: qty 1

## 2019-05-02 MED ORDER — DILTIAZEM HCL ER COATED BEADS 120 MG PO CP24
120.0000 mg | ORAL_CAPSULE | Freq: Every day | ORAL | Status: DC
  Administered 2019-05-02 – 2019-05-03 (×2): 120 mg via ORAL
  Filled 2019-05-02 (×2): qty 1

## 2019-05-02 MED ORDER — INSULIN ASPART 100 UNIT/ML ~~LOC~~ SOLN
6.0000 [IU] | Freq: Three times a day (TID) | SUBCUTANEOUS | Status: DC
  Administered 2019-05-02 – 2019-05-03 (×4): 6 [IU] via SUBCUTANEOUS
  Filled 2019-05-02 (×4): qty 1

## 2019-05-02 MED ORDER — DOCUSATE SODIUM 100 MG PO CAPS
100.0000 mg | ORAL_CAPSULE | Freq: Two times a day (BID) | ORAL | Status: DC | PRN
  Administered 2019-05-02: 100 mg via ORAL
  Filled 2019-05-02: qty 1

## 2019-05-02 NOTE — Progress Notes (Addendum)
Pt reported that he has not have bowel move for three days now. Page prime. Will continue to monitor.  Update 2156: O[facility] NP ordered colace 100 mg tablet twice a day PRN for mild constipation. Will continue to monitor.  Update 0134: Pt just had a bowel movement. Will continue to monitor.

## 2019-05-02 NOTE — Progress Notes (Signed)
Physical Therapy Evaluation Patient Details Name: [name] MRN: [id number] DOB: Sep 04, 1950 Today's Date: 05/02/2019   History of Present Illness  per MD note:Erhard Finigan  is a 69 y.o. Caucasian male with a known history of medical history significant for COPD, type 2 diabetes, hypertension, CKD stage IV, cirrhosis, IgA MGUS, and anemia of chronic disease and iron deficiency who presents to the emergency room with acute onset of malaise and generalized body aches.  He was recently admitted here on 110 and tested positive for COVID-19.  He has been feeling bad as he says since he went home he has been having hard time getting out of bed.  Describes diffuse body aches.  Clinical Impression  Patient reports that he is feeling better today. He needs MI for bed mobility and sit <>stand transfers with HHA + 1. He has 3/5 BLE strength hip flex and knee extension. He has good standing and sitting balance and is able to ambulate with HHA+1 for 20 feet and then is fatigued. He performs standing marching x 5. He will benefit from skilled PT to improve mobility and strength and return to PLOF.      Follow Up Recommendations Home health PT    Equipment Recommendations  Cane    Recommendations for Other Services       Precautions / Restrictions Restrictions Weight Bearing Restrictions: No      Mobility  Bed Mobility Overal bed mobility: Needs Assistance Bed Mobility: Supine to Sit;Sit to Supine     Supine to sit: Min assist Sit to supine: Min guard      Transfers Overall transfer level: Modified independent Equipment used: 1 person hand held assist             General transfer comment: Needs VC for safety  Ambulation/Gait Ambulation/Gait assistance: Modified independent (Device/Increase time) Gait Distance (Feet): 20 Feet Assistive device: 1 person hand held assist Gait Pattern/deviations: Step-to pattern        Stairs            Wheelchair Mobility    Modified  Rankin (Stroke Patients Only)       Balance Overall balance assessment: No apparent balance deficits (not formally assessed)                                           Pertinent Vitals/Pain Pain Assessment: No/denies pain    Home Living Family/patient expects to be discharged to:: Private residence Living Arrangements: Alone     Home Access: Stairs to enter Entrance Stairs-Rails: None Entrance Stairs-Number of Steps: 1 Home Layout: One level        Prior Function Level of Independence: Independent               Hand Dominance        Extremity/Trunk Assessment   Upper Extremity Assessment Upper Extremity Assessment: Overall WFL for tasks assessed    Lower Extremity Assessment Lower Extremity Assessment: Generalized weakness(3/5 BLE hip flex, knee ext)       Communication   Communication: No difficulties  Cognition Arousal/Alertness: Awake/alert Behavior During Therapy: WFL for tasks assessed/performed Overall Cognitive Status: Within Functional Limits for tasks assessed  General Comments      Exercises General Exercises - Lower Extremity Hip Flexion/Marching: AROM;Strengthening;Right;Left;Both;5 reps   Assessment/Plan    PT Assessment Patient needs continued PT services  PT Problem List Decreased strength;Decreased activity tolerance       PT Treatment Interventions      PT Goals (Current goals can be found in the Care Plan section)  Acute Rehab PT Goals Patient Stated Goal: to walk better PT Goal Formulation: Patient unable to participate in goal setting Time For Goal Achievement: 05/16/19 Potential to Achieve Goals: Good    Frequency Min 2X/week   Barriers to discharge Decreased caregiver support      Co-evaluation               AM-PAC PT "6 Clicks" Mobility  Outcome Measure Help needed turning from your back to your side while in a flat bed without  using bedrails?: None Help needed moving from lying on your back to sitting on the side of a flat bed without using bedrails?: None Help needed moving to and from a bed to a chair (including a wheelchair)?: A Little Help needed standing up from a chair using your arms (e.g., wheelchair or bedside chair)?: A Little Help needed to walk in hospital room?: A Little Help needed climbing 3-5 steps with a railing? : A Little 6 Click Score: 20    End of Session Equipment Utilized During Treatment: Gait belt Activity Tolerance: Patient limited by fatigue Patient left: in bed;with bed alarm set Nurse Communication: Mobility status PT Visit Diagnosis: Muscle weakness (generalized) (M62.81);Difficulty in walking, not elsewhere classified (R26.2)    Time: 1425-1450 PT Time Calculation (min) (ACUTE ONLY): 25 min   Charges:   PT Evaluation $PT Eval Low Complexity: 1 Low PT Treatments $Gait Training: 8-22 mins          Alanson Puls, PT DPT 05/02/2019, 2:34 PM

## 2019-05-02 NOTE — Progress Notes (Signed)
PROGRESS NOTE                                                                                                                                                                                                             Patient Demographics:    [name], is a 69 y.o. male, DOB - Oct 26, 1950, TKW:[id number]  Admit date - 04/28/2019   Admitting Physician Christel Mormon, MD  Outpatient Primary MD for the patient is [name], MD  LOS - 4  Outpatient Specialists: None  Chief Complaint  Patient presents with  . COVID       Brief Narrative   69 year old male with history of COPD, not on O2, type 2 diabetes mellitus, CKD stage IV, cirrhosis, IgA MGUS, anemia of chronic disease/iron deficiency, hypertension presented to the ED with generalized body ache and malaise.  He was hospitalized 10 days back for COVID-19 and discharged home on 1/13 with plan on outpatient remdesivir infusion.  He however did not follow-up at the infusion center (had 2 doses remaining, stating that his wife was not feeling well and he could not go there).  Patient reported diffuse body ache, nausea and vomiting but no abdominal pain or diarrhea.  Denies any fevers or chills, dyspnea or cough.  Reported poor p.o. intake. In the ED he was afebrile, found to be in a flutter with RVR and prolonged QTC of 542.  TSH of 2.8.  He is D-dimer was 1900, ferritin of 297 and LDH of 144.  Chemistry showed BUN of 53 and creatinine 3.7 (improved from previously).  His magnesium was 1.4 and high-sensitivity of 24.  He had WBC of 13.6 and hemoglobin of 7.7.  Chest x-ray showed improved bibasilar opacities compared to recent hospitalization. Patient given IV and p.o. Lopressor, IV magnesium sulfate following which his heart rate improved to sinus rhythm. Admitted for further management.    Subjective:   Heart rate remained stable.  Continues to feel better.  Assessment  &  Plan :    Active Problems: New onset atrial flutter with rapid ventricular response (HCC) Heart rate stable on current Cardizem dose.  Transition to long-acting Cardizem today. Started on IV heparin after discussion with GI.  No significant drop in H&H.  Continue IV heparin for today and if stable transition to oral Eliquis and discharged home  tomorrow. Cardiology consult appreciated.  2D echo with normal EF of 60-65% and no wall motion abnormality.     Active symptoms Recent covid 19 Did not go for outpatient remdesivir infusion stating he was weak and his wife was sick as well.  Currently no respiratory issues.  Had nausea and vomiting prior to admission which has resolved. .  Both fibrin derivative D-dimer and CRP elevated however respiratory function is stable.  Further remdesivir or Decadron not needed.   Anemia of chronic disease/iron deficient anemia Baseline hemoglobin of 8, dropped to 6.8, received 1 unit PRBC.  On iron supplement. Patient had EGD and colonoscopy in 2018 for nonbleeding AVMs angiectasia and diverticulosis without bleeding.     Discussed with GI ([name]).  Given his chronic anemia with no signs of acute bleeding recommends to start him on IV heparin and monitor.  Given his recent COVID-19 infection it would be difficult to perform EGD/colonoscopy at present.  Recommends to monitor with IV heparin and if H&H stable, to continue long-term anticoagulation.   COPD with mild exacerbation Continue as needed albuterol.  Off Decadron.  Type 2 diabetes mellitus, uncontrolled with hyperglycemia Continue sliding scale coverage.  Noted for elevated blood glucose on a.m. lab.  Placed on sliding scale coverage.  He is now off Decadron.  Chronic kidney disease stage IV Renal function at baseline.  Off fluids.  Essential hypertension Continue midodrine.  Added Cardizem.  Continue hydralazine, will resume Aldactone if renal function stable in a.m. lab.  Coreg held as  patient is on Cardizem and heart rate was dropping to the 50s.  Hepatic cirrhosis, compensated LFTs stable.  Code Status : Full code  Family Communication  : None at bedside.  Asked patient he responds I do not need to contact any family member.  Disposition Plan  : Home possibly tomorrow if H&H stable  Barriers For Discharge : Active symptoms (monitoring for anemia/thrombocytopenia on heparin drip)  Consults  : Cardiology  Procedures  : 2D echo  DVT Prophylaxis  : IV heparin  Lab Results  Component Value Date   PLT 139 (L) 05/02/2019    Antibiotics  : None  Anti-infectives (From admission, onward)   None        Objective:   Vitals:   05/02/19 0448 05/02/19 0451 05/02/19 0821 05/02/19 1256  BP: (!) 174/86  (!) 164/88 (!) 147/74  Pulse: 63  61 63  Resp: 18  20 18   Temp: 97.6 F (36.4 C)  97.8 F (36.6 C) 98 F (36.7 C)  TempSrc: Oral  Oral Oral  SpO2: 97%  99% 98%  Weight:  99.2 kg    Height:        Wt Readings from Last 3 Encounters:  05/02/19 99.2 kg  04/19/19 81 kg  04/14/19 81.6 kg     Intake/Output Summary (Last 24 hours) at 05/02/2019 1343 Last data filed at 05/02/2019 1035 Gross per 24 hour  Intake 581.07 ml  Output 1245 ml  Net -663.93 ml   Physical exam Elderly male not in distress HEENT: Moist mucosa, supple neck Chest: Clear CVs: Normal S1-S2 GI: Soft, nondistended, nontender Musculoskeletal: No edema      Data Review:    CBC Recent Labs  Lab 04/29/19 0050 04/30/19 0620 05/01/19 0711 05/01/19 1702 05/02/19 0300  WBC 10.5 4.6 8.2 8.3 9.7  HGB 6.8* 8.7* 7.9* 7.8* 8.1*  HCT 21.3* 26.5* 23.5* 23.3* 24.5*  PLT 102* 129* 123* 122* 139*  MCV 91.4 86.9 87.4  87.3 87.5  MCH 29.2 28.5 29.4 29.2 28.9  MCHC 31.9 32.8 33.6 33.5 33.1  RDW 13.9 13.3 13.3 13.3 13.3    Chemistries  Recent Labs  Lab 04/28/19 2113 04/29/19 0050 05/01/19 0711  NA 130* 132* 130*  K 4.4 4.4 4.5  CL 100 103 102  CO2 19* 20* 18*  GLUCOSE 134* 143*  410*  BUN 53* 53* 79*  CREATININE 3.70* 3.95* 3.66*  CALCIUM 7.7* 7.3* 7.1*  MG 1.4*  --  2.0   ------------------------------------------------------------------------------------------------------------------ No results for input(s): CHOL, HDL, LDLCALC, TRIG, CHOLHDL, LDLDIRECT in the last 72 hours.  Lab Results  Component Value Date   HGBA1C 5.1 04/20/2019   ------------------------------------------------------------------------------------------------------------------ No results for input(s): TSH, T4TOTAL, T3FREE, THYROIDAB in the last 72 hours.  Invalid input(s): [name]3 ------------------------------------------------------------------------------------------------------------------ Recent Labs    04/29/19 1509  VITAMINB12 448  FERRITIN 448*  TIBC 168*  IRON 24*    Coagulation profile Recent Labs  Lab 04/30/19 1459  INR 1.2    No results for input(s): DDIMER in the last 72 hours.  Cardiac Enzymes No results for input(s): CKMB, TROPONINI, MYOGLOBIN in the last 168 hours.  Invalid input(s): CK ------------------------------------------------------------------------------------------------------------------    Component Value Date/Time   BNP 547.0 (H) 04/19/2019 1957    Inpatient Medications  Scheduled Meds: . albuterol  2 puff Inhalation QID  . diltiazem  30 mg Oral Q6H  . hydrALAZINE  50 mg Oral Q8H  . insulin aspart  0-5 Units Subcutaneous QHS  . insulin aspart  0-6 Units Subcutaneous TID WC  . insulin aspart  6 Units Subcutaneous TID WC   Continuous Infusions: . heparin 1,450 Units/hr (05/02/19 1016)   PRN Meds:.acetaminophen, ALPRAZolam, ondansetron (ZOFRAN) IV, zolpidem  Micro Results No results found for this or any previous visit (from the past 240 hour(s)).  Radiology Reports DG Chest 2 View  Result Date: 04/28/2019 CLINICAL DATA:  Body aches EXAM: CHEST - 2 VIEW COMPARISON:  April 19, 2019 FINDINGS: There are persistent but improved  peripheral bilateral airspace opacities. The heart size remains borderline enlarged. There is no pneumothorax. No large pleural effusion. There is no acute osseous abnormality. No significant pleural effusion. IMPRESSION: Improving bilateral airspace opacities.  COPD. Electronically Signed   By: Constance Holster M.D.   On: 04/28/2019 21:51   DG Chest 2 View  Result Date: 04/14/2019 CLINICAL DATA:  Dyspnea on exertion for 1 week EXAM: CHEST - 2 VIEW COMPARISON:  04/01/2019 FINDINGS: Cardiac shadow is stable. Aortic calcifications are again seen. The lungs are well aerated bilaterally. No focal infiltrate or sizable effusion is seen. Chronic blunting of the right costophrenic angle is noted. Interval improved aeration in the left base is seen. IMPRESSION: Chronic blunting of the right costophrenic angle. No other focal abnormality is seen. Electronically Signed   By: Inez Catalina M.D.   On: 04/14/2019 15:22   DG Chest Portable 1 View  Result Date: 04/19/2019 CLINICAL DATA:  Shortness of breath. EXAM: PORTABLE CHEST 1 VIEW COMPARISON:  April 14, 2019 FINDINGS: There is been interval development of bilateral airspace opacities primarily in the mid lung zones bilaterally. The heart size remains enlarged. There is persistent blunting of the costophrenic angles. There is no acute osseous abnormality. Aortic calcifications are noted. The lungs appear to be somewhat hyperexpanded. IMPRESSION: 1. Interval development of bilateral airspace opacities concerning for developing multifocal pneumonia. Pulmonary edema can have a similar appearance in the appropriate clinical setting. 2. Hyperexpanded lungs, similar to prior study. 3. Persistent blunting  of the costophrenic angles which may be secondary to small bilateral pleural effusions and or atelectasis. Electronically Signed   By: Constance Holster M.D.   On: 04/19/2019 20:06   ECHOCARDIOGRAM COMPLETE  Result Date: 04/30/2019   ECHOCARDIOGRAM REPORT   Patient  Name:   KARLO GOEDEN Date of Exam: 04/30/2019 Medical Rec #:  [id number]     Height:       70.0 in Accession #:    [id number]    Weight:       185.0 lb Date of Birth:  [birth year]     BSA:          2.02 m Patient Age:    89 years      BP:           154/82 mmHg Patient Gender: M             HR:           58 bpm. Exam Location:  [facility] Procedure: 2D Echo, Cardiac Doppler and Color Doppler Indications:     Atrial Flutter 427.32  History:         Patient has prior history of Echocardiogram examinations, most                  recent 11/12/2014. COPD, Signs/Symptoms:Shortness of Breath; Risk                  Factors:Diabetes.  Sonographer:     Sherrie Sport RDCS (AE) Referring Phys:  [id number] [facility] Diagnosing Phys: Yolonda Kida MD IMPRESSIONS  1. Left ventricular ejection fraction, by visual estimation, is 60 to 65%. The left ventricle has normal function. Left ventricular septal wall thickness was normal. Normal left ventricular posterior wall thickness. There is no left ventricular hypertrophy.  2. The left ventricle has no regional wall motion abnormalities.  3. Global right ventricle has mildly reduced systolic function.The right ventricular size is mildly enlarged. No increase in right ventricular wall thickness.  4. Left atrial size was mildly dilated.  5. Right atrial size was normal.  6. The mitral valve is myxomatous. Mild mitral valve regurgitation.  7. The tricuspid valve is normal in structure.  8. The tricuspid valve is normal in structure. Tricuspid valve regurgitation is trivial.  9. The aortic valve is normal in structure. Aortic valve regurgitation is mild. Mild to moderate aortic valve sclerosis/calcification without any evidence of aortic stenosis. 10. The pulmonic valve was grossly normal. Pulmonic valve regurgitation is not visualized. FINDINGS  Left Ventricle: Left ventricular ejection fraction, by visual estimation, is 60 to 65%. The left ventricle has normal function. The left ventricle has no  regional wall motion abnormalities. Normal left ventricular posterior wall thickness. There is no left ventricular hypertrophy. Right Ventricle: The right ventricular size is mildly enlarged. No increase in right ventricular wall thickness. Global RV systolic function is has mildly reduced systolic function. Left Atrium: Left atrial size was mildly dilated. Right Atrium: Right atrial size was normal in size Pericardium: There is no evidence of pericardial effusion. Mitral Valve: The mitral valve is myxomatous. Mild mitral valve regurgitation. Tricuspid Valve: The tricuspid valve is normal in structure. Tricuspid valve regurgitation is trivial. Aortic Valve: The aortic valve is normal in structure. Aortic valve regurgitation is mild. Mild to moderate aortic valve sclerosis/calcification is present, without any evidence of aortic stenosis. Aortic valve mean gradient measures 4.0 mmHg. Aortic valve peak gradient measures 6.6 mmHg. Aortic valve area, by VTI measures 2.82 cm. Pulmonic Valve:  The pulmonic valve was grossly normal. Pulmonic valve regurgitation is not visualized. Pulmonic regurgitation is not visualized. Aorta: The aortic root is normal in size and structure. IAS/Shunts: No atrial level shunt detected by color flow Doppler.  LEFT VENTRICLE PLAX 2D LVIDd:         4.57 cm  Diastology LVIDs:         2.41 cm  LV e' lateral:   6.53 cm/s LV PW:         1.13 cm  LV E/e' lateral: 19.4 LV IVS:        1.70 cm  LV e' medial:    5.11 cm/s LVOT diam:     2.00 cm  LV E/e' medial:  24.9 LV SV:         75 ml LV SV Index:   36.84 LVOT Area:     3.14 cm  RIGHT VENTRICLE RV Basal diam:  4.40 cm RV S prime:     12.40 cm/s TAPSE (M-mode): 3.5 cm LEFT ATRIUM             Index       RIGHT ATRIUM           Index LA diam:        4.50 cm 2.23 cm/m  RA Area:     21.40 cm LA Vol (A2C):   76.2 ml 37.74 ml/m RA Volume:   68.80 ml  34.07 ml/m LA Vol (A4C):   47.8 ml 23.67 ml/m LA Biplane Vol: 64.6 ml 31.99 ml/m  AORTIC VALVE                    PULMONIC VALVE AV Area (Vmax):    2.69 cm    PV Vmax:        0.99 m/s AV Area (Vmean):   2.46 cm    PV Peak grad:   3.9 mmHg AV Area (VTI):     2.82 cm    RVOT Peak grad: 5 mmHg AV Vmax:           128.50 cm/s AV Vmean:          89.800 cm/s AV VTI:            0.314 m AV Peak Grad:      6.6 mmHg AV Mean Grad:      4.0 mmHg LVOT Vmax:         110.00 cm/s LVOT Vmean:        70.200 cm/s LVOT VTI:          0.282 m LVOT/AV VTI ratio: 0.90  AORTA Ao Root diam: 3.00 cm MITRAL VALVE MV Area (PHT): 1.96 cm              SHUNTS MV PHT:        112.23 msec           Systemic VTI:  0.28 m MV Decel Time: 387 msec              Systemic Diam: 2.00 cm MV E velocity: 127.00 cm/s 103 cm/s MV A velocity: 106.00 cm/s 70.3 cm/s MV E/A ratio:  1.20        1.5  Dwayne Prince Rome MD Electronically signed by Yolonda Kida MD Signature Date/Time: 04/30/2019/1:59:29 PM    Final     Time Spent in minutes  35   Bular Hickok M.D on 05/02/2019 at 1:43 PM  Between 7am to 7pm - Pager - [phone]  After 7pm  go to [email or web address] - password [facility]  [facility] -  Office  [phone]

## 2019-05-02 NOTE — Plan of Care (Signed)
  Problem: Education: Goal: Knowledge of General Education information will improve Description: Including pain rating scale, medication(s)/side effects and non-pharmacologic comfort measures Outcome: Progressing   Problem: Clinical Measurements: Goal: Respiratory complications will improve Outcome: Progressing   Problem: Nutrition: Goal: Adequate nutrition will be maintained Outcome: Progressing   Problem: Elimination: Goal: Will not experience complications related to bowel motility Outcome: Progressing

## 2019-05-02 NOTE — Progress Notes (Signed)
ANTICOAGULATION CONSULT NOTE - Follow up [place] for heparin Indication: atrial fibrillation  No Known Allergies  Patient Measurements: Height: 5\' 10"  (177.8 cm) Weight: 194 lb 3.2 oz (88.1 kg) IBW/kg (Calculated) : 73 Heparin Dosing Weight: 83.9 kg  Vital Signs: Temp: 98.2 F (36.8 C) (01/22 2312) Temp Source: Oral (01/22 2312) BP: 155/89 (01/22 2312) Pulse Rate: 69 (01/22 2312)  Labs: Recent Labs    04/30/19 0620 04/30/19 1459 04/30/19 1459 04/30/19 2346 04/30/19 2346 05/01/19 0711 05/01/19 0711 05/01/19 0859 05/01/19 1702 05/02/19 0300  HGB   < >  --   --   --   --  7.9*   < >  --  7.8* 8.1*  HCT   < >  --   --   --   --  23.5*  --   --  23.3* 24.5*  PLT   < >  --   --   --   --  123*  --   --  122* 139*  APTT  --  29   < > 50*   < >  --   --  39* 74* 68*  LABPROT  --  14.8  --   --   --   --   --   --   --   --   INR  --  1.2  --   --   --   --   --   --   --   --   HEPARINUNFRC  --  1.11*   < > 0.97*  --   --   --  0.97*  --  0.53  CREATININE  --   --   --   --   --  3.66*  --   --   --   --    < > = values in this interval not displayed.    Estimated Creatinine Clearance: 22.2 mL/min (A) (by C-G formula based on SCr of 3.66 mg/dL (H)).   Assessment: Pharmacy consulted to start heparin for afib. Pt received a dose of apixaban on 1/20 @1142 . Baseline labs ordered. Pt only received one dose, doubt it will have much interference. There was a drop in hgb on 1/20 but currently up to 8.7 after transfusion on 1/20.    1/21 Heparin 2000 units bolus x 1, infusion at 1150 units/hr 1/21 @ 2346 aPTT 50, HL 0.97, rebolus 1000 units x1, incr to 1300 units/hr. 1/22 @ 0859 aPTT 39 subtherapeutic, HL 0.97, not correlating. RN confirms heparin drip infusing at 13 ml/hr, no line issues or interruptions known, no obvious s/sx of bleeding.  Will bolus heparin 2000 units IV x1 then increase drip to 1450 units/hr.  Goal of Therapy:  Heparin level 0.3-0.7  units/ml aPTT 66-102 seconds Monitor platelets by anticoagulation protocol: Yes   Plan:  1/22@1702 : APTT 72 sec. Currently therapeutic. Will continue infusion at 1450 units/hr. Hgb and Platelets remain stable. Recheck aPTT/HL in 8 hours.  CBC in AM 1/23 @ 0300 HL 0.53, aPTT 68 both therapeutic.  Will use HL to adjust Heparin.  CBC stable.  Continue current rate and f/u HL in 8 hours.  Pharmacy will continue to follow   Ena Dawley,  05/02/2019,3:43 AM

## 2019-05-02 NOTE — Progress Notes (Signed)
ANTICOAGULATION CONSULT NOTE - Follow up [place] for heparin Indication: atrial fibrillation  No Known Allergies  Patient Measurements: Height: 5\' 10"  (177.8 cm) Weight: 218 lb 11.1 oz (99.2 kg) IBW/kg (Calculated) : 73 Heparin Dosing Weight: 83.9 kg  Vital Signs: Temp: 97.8 F (36.6 C) (01/23 0821) Temp Source: Oral (01/23 0821) BP: 164/88 (01/23 0821) Pulse Rate: 61 (01/23 0821)  Labs: Recent Labs    04/30/19 1459 04/30/19 2346 05/01/19 0711 05/01/19 0711 05/01/19 0859 05/01/19 1702 05/02/19 0300 05/02/19 1208  HGB  --   --  7.9*   < >  --  7.8* 8.1*  --   HCT  --   --  23.5*  --   --  23.3* 24.5*  --   PLT  --   --  123*  --   --  122* 139*  --   APTT 29   < >  --   --  39* 74* 68*  --   LABPROT 14.8  --   --   --   --   --   --   --   INR 1.2  --   --   --   --   --   --   --   HEPARINUNFRC 1.11*   < >  --   --  0.97*  --  0.53 0.49  CREATININE  --   --  3.66*  --   --   --   --   --    < > = values in this interval not displayed.    Estimated Creatinine Clearance: 23.4 mL/min (A) (by C-G formula based on SCr of 3.66 mg/dL (H)).   Assessment: Pharmacy consulted to start heparin for afib. Pt received a dose of apixaban on 1/20 @1142 . Baseline labs ordered. Pt only received one dose, doubt it will have much interference. There was a drop in hgb on 1/20 but currently up to 8.7 after transfusion on 1/20.    Will bolus heparin 2000 units IV x1 then increase drip to 1450 units/hr. 1/22@1702 : APTT 72 sec. Currently therapeutic. Will continue infusion at 1450 units/hr. Hgb and Platelets remain stable. Recheck aPTT/HL in 8 hours.  CBC in AM 1/23 @ 0300 HL 0.53, aPTT 68 both therapeutic.  Will use HL to adjust Heparin.  CBC stable.  Continue current rate and f/u HL in 8 hours. 1/23 @ 1208 HL 0.49   Goal of Therapy:  Heparin level 0.3-0.7 units/ml aPTT 66-102 seconds Monitor platelets by anticoagulation protocol: Yes   Plan:  Heparin level  therapeutic x 2. Will continue current rate and follow with HL and CBC with AM labs.    Pharmacy will continue to follow   Oswald Hillock,  05/02/2019,12:48 PM

## 2019-05-03 DIAGNOSIS — E1159 Type 2 diabetes mellitus with other circulatory complications: Secondary | ICD-10-CM

## 2019-05-03 DIAGNOSIS — J9601 Acute respiratory failure with hypoxia: Secondary | ICD-10-CM

## 2019-05-03 LAB — CBC
HCT: 23.5 % — ABNORMAL LOW (ref 39.0–52.0)
Hemoglobin: 7.8 g/dL — ABNORMAL LOW (ref 13.0–17.0)
MCH: 29.3 pg (ref 26.0–34.0)
MCHC: 33.2 g/dL (ref 30.0–36.0)
MCV: 88.3 fL (ref 80.0–100.0)
Platelets: 121 10*3/uL — ABNORMAL LOW (ref 150–400)
RBC: 2.66 MIL/uL — ABNORMAL LOW (ref 4.22–5.81)
RDW: 13.6 % (ref 11.5–15.5)
WBC: 7.8 10*3/uL (ref 4.0–10.5)
nRBC: 0 % (ref 0.0–0.2)

## 2019-05-03 LAB — GLUCOSE, CAPILLARY
Glucose-Capillary: 135 mg/dL — ABNORMAL HIGH (ref 70–99)
Glucose-Capillary: 147 mg/dL — ABNORMAL HIGH (ref 70–99)

## 2019-05-03 LAB — HEPARIN LEVEL (UNFRACTIONATED): Heparin Unfractionated: 0.53 IU/mL (ref 0.30–0.70)

## 2019-05-03 MED ORDER — DILTIAZEM HCL ER COATED BEADS 120 MG PO CP24: 120.0000 mg | ORAL_CAPSULE | Freq: Every day | ORAL | 1 refills | Status: DC

## 2019-05-03 MED ORDER — DILTIAZEM HCL ER COATED BEADS 180 MG PO CP24: 180.0000 mg | ORAL_CAPSULE | Freq: Every day | ORAL | 1 refills | Status: AC

## 2019-05-03 MED ORDER — APIXABAN 5 MG PO TABS
5.0000 mg | ORAL_TABLET | Freq: Two times a day (BID) | ORAL | Status: DC
  Administered 2019-05-03: 5 mg via ORAL
  Filled 2019-05-03: qty 1

## 2019-05-03 MED ORDER — HYDRALAZINE HCL 100 MG PO TABS: 50.0000 mg | ORAL_TABLET | Freq: Three times a day (TID) | ORAL | 0 refills | Status: AC

## 2019-05-03 MED ORDER — APIXABAN 5 MG PO TABS: 5.0000 mg | ORAL_TABLET | Freq: Two times a day (BID) | ORAL | 1 refills | Status: AC

## 2019-05-03 NOTE — Progress Notes (Signed)
pts family member called with concerns about discharge, concerned about next home health visit that nobody will be available to allow them in, wanted to know what time they will be coming, informed family member they will receive call from home health agency to set up.

## 2019-05-03 NOTE — Consult Note (Signed)
ANTICOAGULATION CONSULT NOTE - Initial Consult  Pharmacy Consult for Apixaban Indication: atrial fibrillation  No Known Allergies  Patient Measurements: Height: 5\' 10"  (177.8 cm) Weight: 196 lb 11.2 oz (89.2 kg) IBW/kg (Calculated) : 73  Vital Signs: Temp: 97.9 F (36.6 C) (01/24 0541) Temp Source: Oral (01/24 0541) BP: 151/76 (01/24 0541) Pulse Rate: 62 (01/24 0541)  Labs: Recent Labs    04/30/19 1459 04/30/19 2346 05/01/19 0711 05/01/19 0711 05/01/19 0859 05/01/19 0859 05/01/19 1702 05/02/19 0300 05/02/19 1208 05/03/19 0419  HGB  --   --  7.9*   < >  --    < > 7.8* 8.1*  --  7.8*  HCT  --   --  23.5*   < >  --   --  23.3* 24.5*  --  23.5*  PLT  --   --  123*   < >  --   --  122* 139*  --  121*  APTT 29   < >  --   --  39*  --  74* 68*  --   --   LABPROT 14.8  --   --   --   --   --   --   --   --   --   INR 1.2  --   --   --   --   --   --   --   --   --   HEPARINUNFRC 1.11*   < >  --   --  0.97*   < >  --  0.53 0.49 0.53  CREATININE  --   --  3.66*  --   --   --   --   --   --   --    < > = values in this interval not displayed.    Estimated Creatinine Clearance: 22.3 mL/min (A) (by C-G formula based on SCr of 3.66 mg/dL (H)).   Medical History: Past Medical History:  Diagnosis Date  . Anemia   . 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 CHF (congestive heart failure) ([place])   . Chronic kidney disease   . COPD (chronic obstructive pulmonary disease) ([place])    NO INHALERS  . Diabetes mellitus    NO MEDS  . GERD (gastroesophageal reflux disease)   . Hepatic cirrhosis ([place])   . Hypertension   . Leg swelling   . Loose, teeth    PATIENT STATES "HAS 6 TEETH, SOME ARE LOOSE"  . Neuromuscular disorder ([place])    RIGHT HAND AND FINGERS NUMB  . Shortness of breath dyspnea    WITH EXERTION   . 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 albuterol (VENTOLIN HFA) 108 (90 Base) MCG/ACT inhaler Inhale 2 puffs into the lungs every 6 (six) hours as needed for wheezing or shortness of breath.   Unknown at PRN  . allopurinol (ZYLOPRIM) 100 MG tablet Take 100 mg by mouth daily.   24+ hours at Unknown  . amLODipine (NORVASC) 10 MG tablet Take 10 mg by mouth daily.    24+ hours at Unknown  . ascorbic acid (VITAMIN C) 500 MG tablet Take 1 tablet (500 mg total) by mouth daily. 30 tablet 0 24+ hours at Unknown  . betamethasone dipropionate 0.05 % cream Apply topically 2 (two) times daily. [place]  g 3 24+ hours at Unknown  . carvedilol (COREG) 3.125 MG tablet Take 3.125 mg by mouth 2 (two) times daily.   24+ hours at Unknown  . DULERA 200-5 MCG/ACT AERO Inhale 1 puff into the lungs 2 (two) times daily.   24+ hours at Unknown  . ferrous sulfate 325 (65 FE) MG tablet Take 325 mg by mouth daily.    24+ hours at Unknown  . gentamicin cream (GARAMYCIN) 0.1 % Apply 1 application topically 2 (two) times daily. 45 g 1 24+ hours at Unknown  . hydrALAZINE (APRESOLINE) 100 MG tablet Take 100 mg by mouth 3 (three) times daily.    24+ hours at Unknown  . omeprazole (PRILOSEC) 40 MG capsule Take 40 mg by mouth every morning.    24+ hours at Unknown  . polyethylene glycol (MIRALAX / GLYCOLAX) packet Take 17 g by mouth daily as needed for mild constipation.    Unknown at PRN  . sodium bicarbonate 650 MG tablet Take 1 tablet (650 mg total) by mouth 3 (three) times daily. 20 tablet 0 24+ hours at Unknown  . SPIRIVA RESPIMAT 1.25 MCG/ACT AERS Inhale 1 spray into the lungs daily.   24+ hours at Unknown  . torsemide (DEMADEX) 10 MG tablet Take 10 mg by mouth daily.    24+ hours at Unknown  . vitamin B-12 (CYANOCOBALAMIN) 1000 MCG tablet Take 1 tablet (1,000 mcg total) by mouth daily. 30 tablet 3   . zinc sulfate 220 (50 Zn) MG capsule Take 1 capsule (220 mg total) by mouth daily. 30 capsule 0    Scheduled:  .  albuterol  2 puff Inhalation QID  . diltiazem  120 mg Oral Daily  . hydrALAZINE  50 mg Oral Q8H  . insulin aspart  0-5 Units Subcutaneous QHS  . insulin aspart  0-6 Units Subcutaneous TID WC  . insulin aspart  6 Units Subcutaneous TID WC  . polyethylene glycol  17 g Oral Daily   Infusions:  . heparin 1,450 Units/hr (05/02/19 2134)   PRN: acetaminophen, ALPRAZolam, docusate sodium, ondansetron (ZOFRAN) IV, zolpidem Anti-infectives (From admission, onward)   None      Assessment: Pharmacy consulted to start apixaban for afib. Will d/c heparin infusion.   Goal of Therapy:  Monitor platelets by anticoagulation protocol: Yes   Plan:  Will start apixaban 5 mg BID. Pt does not meet criteria for dose reduction. Only meets 1 out of 3 criteria.   Oswald Hillock, PharmD, BCPS 05/03/2019,7:47 AM

## 2019-05-03 NOTE — Discharge Instructions (Signed)
Atrial Fibrillation  Atrial fibrillation is a type of heartbeat that is irregular or fast. If you have this condition, your heart beats without any order. This makes it hard for your heart to pump blood in a normal way. Atrial fibrillation may come and go, or it may become a long-lasting problem. If this condition is not treated, it can put you at higher risk for stroke, heart failure, and other heart problems. What are the causes? This condition may be caused by diseases that damage the heart. They include:  High blood pressure.  Heart failure.  Heart valve disease.  Heart surgery. Other causes include:  Diabetes.  Thyroid disease.  Being overweight.  Kidney disease. Sometimes the cause is not known. What increases the risk? You are more likely to develop this condition if:  You are older.  You smoke.  You exercise often and very hard.  You have a family history of this condition.  You are a man.  You use drugs.  You drink a lot of alcohol.  You have lung conditions, such as emphysema, pneumonia, or COPD.  You have sleep apnea. What are the signs or symptoms? Common symptoms of this condition include:  A feeling that your heart is beating very fast.  Chest pain or discomfort.  Feeling short of breath.  Suddenly feeling light-headed or weak.  Getting tired easily during activity.  Fainting.  Sweating. In some cases, there are no symptoms. How is this treated? Treatment for this condition depends on underlying conditions and how you feel when you have atrial fibrillation. They include:  Medicines to: ? Prevent blood clots. ? Treat heart rate or heart rhythm problems.  Using devices, such as a pacemaker, to correct heart rhythm problems.  Doing surgery to remove the part of the heart that sends bad signals.  Closing an area where clots can form in the heart (left atrial appendage). In some cases, your doctor will treat other underlying  conditions. Follow these instructions at home: Medicines  Take over-the-counter and prescription medicines only as told by your doctor.  Do not take any new medicines without first talking to your doctor.  If you are taking blood thinners: ? Talk with your doctor before you take any medicines that have aspirin or NSAIDs, such as ibuprofen, in them. ? Take your medicine exactly as told by your doctor. Take it at the same time each day. ? Avoid activities that could hurt or bruise you. Follow instructions about how to prevent falls. ? Wear a bracelet that says you are taking blood thinners. Or, carry a card that lists what medicines you take. Lifestyle      Do not use any products that have nicotine or tobacco in them. These include cigarettes, e-cigarettes, and chewing tobacco. If you need help quitting, ask your doctor.  Eat heart-healthy foods. Talk with your doctor about the right eating plan for you.  Exercise regularly as told by your doctor.  Do not drink alcohol.  Lose weight if you are overweight.  Do not use drugs, including cannabis. General instructions  If you have a condition that causes breathing to stop for a short period of time (apnea), treat it as told by your doctor.  Keep a healthy weight. Do not use diet pills unless your doctor says they are safe for you. Diet pills may make heart problems worse.  Keep all follow-up visits as told by your doctor. This is important. Contact a doctor if:  You notice a change   in the speed, rhythm, or strength of your heartbeat.  You are taking a blood-thinning medicine and you get more bruising.  You get tired more easily when you move or exercise.  You have a sudden change in weight. Get help right away if:   You have pain in your chest or your belly (abdomen).  You have trouble breathing.  You have side effects of blood thinners, such as blood in your vomit, poop (stool), or pee (urine), or bleeding that cannot  stop.  You have any signs of a stroke. "BE FAST" is an easy way to remember the main warning signs: ? B - Balance. Signs are dizziness, sudden trouble walking, or loss of balance. ? E - Eyes. Signs are trouble seeing or a change in how you see. ? F - Face. Signs are sudden weakness or loss of feeling in the face, or the face or eyelid drooping on one side. ? A - Arms. Signs are weakness or loss of feeling in an arm. This happens suddenly and usually on one side of the body. ? S - Speech. Signs are sudden trouble speaking, slurred speech, or trouble understanding what people say. ? T - Time. Time to call emergency services. Write down what time symptoms started.  You have other signs of a stroke, such as: ? A sudden, very bad headache with no known cause. ? Feeling like you may vomit (nausea). ? Vomiting. ? A seizure. These symptoms may be an emergency. Do not wait to see if the symptoms will go away. Get medical help right away. Call your local emergency services (911 in the [place] Do not drive yourself to the hospital. Summary  Atrial fibrillation is a type of heartbeat that is irregular or fast.  You are at higher risk of this condition if you smoke, are older, have diabetes, or are overweight.  Follow your doctor's instructions about medicines, diet, exercise, and follow-up visits.  Get help right away if you have signs or symptoms of a stroke.  Get help right away if you cannot catch your breath, or you have chest pain or discomfort. This information is not intended to replace advice given to you by your health care provider. Make sure you discuss any questions you have with your health care provider. Document Revised: 09/17/2018 Document Reviewed: 09/17/2018 Elsevier Patient Education  [place].  Apixaban oral tablets What is this medicine? APIXABAN (a PIX a ban) is an anticoagulant (blood thinner). It is used to lower the chance of stroke in people with a medical  condition called atrial fibrillation. It is also used to treat or prevent blood clots in the lungs or in the veins. This medicine may be used for other purposes; ask your health care provider or pharmacist if you have questions. COMMON BRAND NAME(S): Eliquis What should I tell my health care provider before I take this medicine? They need to know if you have any of these conditions:  antiphospholipid antibody syndrome  bleeding disorders  bleeding in the brain  blood in your stools (black or tarry stools) or if you have blood in your vomit  history of blood clots  history of stomach bleeding  kidney disease  liver disease  mechanical heart valve  an unusual or allergic reaction to apixaban, other medicines, foods, dyes, or preservatives  pregnant or trying to get pregnant  breast-feeding How should I use this medicine? Take this medicine by mouth with a glass of water. Follow the directions on the  prescription label. You can take it with or without food. If it upsets your stomach, take it with food. Take your medicine at regular intervals. Do not take it more often than directed. Do not stop taking except on your doctor's advice. Stopping this medicine may increase your risk of a blood clot. Be sure to refill your prescription before you run out of medicine. Talk to your pediatrician regarding the use of this medicine in children. Special care may be needed. Overdosage: If you think you have taken too much of this medicine contact a poison control center or emergency room at once. NOTE: This medicine is only for you. Do not share this medicine with others. What if I miss a dose? If you miss a dose, take it as soon as you can. If it is almost time for your next dose, take only that dose. Do not take double or extra doses. What may interact with this medicine? This medicine may interact with the following:  aspirin and aspirin-like medicines  certain medicines for fungal  infections like ketoconazole and itraconazole  certain medicines for seizures like carbamazepine and phenytoin  certain medicines that treat or prevent blood clots like warfarin, enoxaparin, and dalteparin  clarithromycin  NSAIDs, medicines for pain and inflammation, like ibuprofen or naproxen  rifampin  ritonavir  St. John's wort This list may not describe all possible interactions. Give your health care provider a list of all the medicines, herbs, non-prescription drugs, or dietary supplements you use. Also tell them if you smoke, drink alcohol, or use illegal drugs. Some items may interact with your medicine. What should I watch for while using this medicine? Visit your healthcare professional for regular checks on your progress. You may need blood work done while you are taking this medicine. Your condition will be monitored carefully while you are receiving this medicine. It is important not to miss any appointments. Avoid sports and activities that might cause injury while you are using this medicine. Severe falls or injuries can cause unseen bleeding. Be careful when using sharp tools or knives. Consider using an Copy. Take special care brushing or flossing your teeth. Report any injuries, bruising, or red spots on the skin to your healthcare professional. If you are going to need surgery or other procedure, tell your healthcare professional that you are taking this medicine. Wear a medical ID bracelet or chain. Carry a card that describes your disease and details of your medicine and dosage times. What side effects may I notice from receiving this medicine? Side effects that you should report to your doctor or health care professional as soon as possible:  allergic reactions like skin rash, itching or hives, swelling of the face, lips, or tongue  signs and symptoms of bleeding such as bloody or black, tarry stools; red or [place]-brown urine; spitting up blood or brown  material that looks like coffee grounds; red spots on the skin; unusual bruising or bleeding from the eye, gums, or nose  signs and symptoms of a blood clot such as chest pain; shortness of breath; pain, swelling, or warmth in the leg  signs and symptoms of a stroke such as changes in vision; confusion; trouble speaking or understanding; severe headaches; sudden numbness or weakness of the face, arm or leg; trouble walking; dizziness; loss of coordination This list may not describe all possible side effects. Call your doctor for medical advice about side effects. You may report side effects to FDA at 1-800-FDA-1088. Where should I keep  my medicine? Keep out of the reach of children. Store at room temperature between 20 and 25 degrees C (68 and 77 degrees F). Throw away any unused medicine after the expiration date. NOTE: This sheet is a summary. It may not cover all possible information. If you have questions about this medicine, talk to your doctor, pharmacist, or health care provider.  2020 Elsevier/Gold Standard (2017-12-04 17:39:34)

## 2019-05-03 NOTE — Progress Notes (Signed)
ANTICOAGULATION CONSULT NOTE - Follow up [facility] for heparin Indication: atrial fibrillation  No Known Allergies  Patient Measurements: Height: 5\' 10"  (177.8 cm) Weight: 218 lb 11.1 oz (99.2 kg) IBW/kg (Calculated) : 73 Heparin Dosing Weight: 83.9 kg  Vital Signs: Temp: 98.2 F (36.8 C) (01/23 2014) Temp Source: Oral (01/23 2014) BP: 156/80 (01/23 2014) Pulse Rate: 70 (01/23 2014)  Labs: Recent Labs    04/30/19 1459 04/30/19 2346 05/01/19 0711 05/01/19 0711 05/01/19 0859 05/01/19 0859 05/01/19 1702 05/02/19 0300 05/02/19 1208 05/03/19 0419  HGB  --   --  7.9*   < >  --    < > 7.8* 8.1*  --  7.8*  HCT  --   --  23.5*   < >  --   --  23.3* 24.5*  --  23.5*  PLT  --   --  123*   < >  --   --  122* 139*  --  121*  APTT 29   < >  --   --  39*  --  74* 68*  --   --   LABPROT 14.8  --   --   --   --   --   --   --   --   --   INR 1.2  --   --   --   --   --   --   --   --   --   HEPARINUNFRC 1.11*   < >  --   --  0.97*   < >  --  0.53 0.49 0.53  CREATININE  --   --  3.66*  --   --   --   --   --   --   --    < > = values in this interval not displayed.    Estimated Creatinine Clearance: 23.4 mL/min (A) (by C-G formula based on SCr of 3.66 mg/dL (H)).   Assessment: Pharmacy consulted to start heparin for afib. Pt received a dose of apixaban on 1/20 @1142 . Baseline labs ordered. Pt only received one dose, doubt it will have much interference. There was a drop in hgb on 1/20 but currently up to 8.7 after transfusion on 1/20.    Will bolus heparin 2000 units IV x1 then increase drip to 1450 units/hr. 1/22@1702 : APTT 72 sec. Currently therapeutic. Will continue infusion at 1450 units/hr. Hgb and Platelets remain stable. Recheck aPTT/HL in 8 hours.  CBC in AM 1/23 @ 0300 HL 0.53, aPTT 68 both therapeutic.  Will use HL to adjust Heparin.  CBC stable.  Continue current rate and f/u HL in 8 hours. 1/23 @ 1208 HL 0.49   Goal of Therapy:  Heparin level 0.3-0.7  units/ml aPTT 66-102 seconds Monitor platelets by anticoagulation protocol: Yes   Plan:  Heparin level therapeutic x 2. Will continue current rate and follow with HL and CBC with AM labs.   1/24: HL 0.53 @ 0419, Heparin level therapeutic x 3.  CBC slightly worse.  Continue current rate and f/u HL and CBC tomorrow am  Pharmacy will continue to follow   Hart Robinsons A,  05/03/2019,5:34 AM

## 2019-05-03 NOTE — Progress Notes (Signed)
Patient ready for discharge and will discharge home today. Patient was previously receiving [place] [facility] PT and RN through [facility] and would like to resume care with them. Confirmed patient has family to provide transportation upon discharge and they will also bring clothes for patient to wear home. Patient will have home health services with [facility] for RN and PT. Cane left with RN at nurses station. Brad with [employer] updated about DME. Spoke with Tillie Rung with [facility] to make aware of discharge today. Updated RN [place]. Consult closed.   Oretha Ellis, LCSW Transitions of Care Dept. [phone]

## 2019-05-03 NOTE — Discharge Summary (Addendum)
Physician Discharge Summary  [name] SHF:[id number] DOB: 04/20/1950 DOA: 04/28/2019  PCP: [name], MD  Admit date: 04/28/2019 Discharge date: 05/03/2019  Admitted From: Home Disposition: Home  Recommendations for Outpatient Follow-up:  1. Follow up with PCP in 1-2 weeks.  Recheck H&H during outpatient follow-up. 2. Follow with cardiology in 4 weeks (referral made to A. fib clinic 3.  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  He was hospitalized 10 days back for COVID-19 and discharged home on 1/13 with plan on outpatient remdesivir infusion.  He however did not follow-up at the infusion center (had 2 doses remaining, stating that his wife was not feeling well and he could not go there).  Patient reported diffuse body ache, nausea and vomiting but no abdominal pain or diarrhea.  Denies any fevers or chills, dyspnea or cough.  Reported poor p.o. intake. In the ED he was afebrile, found to be in a flutter with RVR and prolonged QTC of 542.  TSH of 2.8.  He is D-dimer was 1900, ferritin of 297 and LDH of 144.  Chemistry showed BUN of 53 and creatinine 3.7 (improved from previously).  His magnesium was 1.4 and high-sensitivity of 24.  He had WBC of  13.6 and hemoglobin of 7.7.  Chest x-ray showed improved bibasilar opacities compared to recent hospitalization. Patient given IV and p.o. Lopressor, IV magnesium sulfate following which his heart rate improved to sinus rhythm. Admitted for further management.  Active Problems: New onset atrial flutter with rapid ventricular response (HCC) Heart rate stable on current Cardizem dose.  Transition to long-acting Cardizem today. Started on IV heparin after discussion with GI.  No significant drop in H&H.    Will discharge on Eliquis.  Cardiology consult appreciated.  2D echo with normal EF of 60-65% and no wall motion abnormality.  Patient will be discharged on Cardizem and Eliquis.  Outpatient referral to A. fib clinic.     Active symptoms Recent covid 19 No longer an active infection. Patient did not go for outpatient remdesivir infusion stating he was weak and his wife was sick as well.  Currently no respiratory issues.  Had nausea and vomiting prior to admission which has resolved. .  Both fibrin derivative D-dimer and CRP elevated however respiratory function is stable.  Further remdesivir or Decadron not needed.   Anemia of chronic disease/iron deficient anemia Baseline hemoglobin of 8, dropped to 6.8, received 1 unit PRBC.  On iron supplement. Patient had EGD and colonoscopy in 2018 for nonbleeding AVMs angiectasia and diverticulosis without bleeding.     Discussed with GI (Dr. Vicente [name]).  Given his chronic anemia with no signs of acute bleeding recommends to start him on IV heparin and monitor.  Given his recent COVID-19 infection it would be difficult to perform EGD/colonoscopy at  present.  Recommends to monitor with IV heparin and if H&H stable, to continue long-term anticoagulation.  Patient H&H remains stable.  Transition to Eliquis and will be discharged on it.   COPD with mild exacerbation Stable now.  Continue home inhaler.  Type 2 diabetes mellitus, uncontrolled with  hyperglycemia cbg elevated. Stable on sliding scale. Not on any meds at home.  He is now off Decadron.  Chronic kidney disease stage IV Renal function at baseline.  Off fluids.  Essential hypertension Added Cardizem and dose adjusted.  Discharged on reduced hydralazine dose.  Coreg held due to low normal heart rate.  Will discontinue amlodipine since patient is on Cardizem.  Outpatient follow-up  Generalized weakness PT recommends home health  Hepatic cirrhosis, compensated LFTs stable.    Family Communication  :  Updated niece on the phone  Disposition Plan  : Home  Consults  : Cardiology  Procedures  : 2D echo   Discharge Instructions  Discharge Instructions    Amb referral to AFIB Clinic   Complete by: As directed      Allergies as of 05/03/2019   No Known Allergies     Medication List    STOP taking these medications   amLODipine 10 MG tablet Commonly known as: NORVASC   carvedilol 3.125 MG tablet Commonly known as: COREG     TAKE these medications   albuterol 108 (90 Base) MCG/ACT inhaler Commonly known as: VENTOLIN HFA Inhale 2 puffs into the lungs every 6 (six) hours as needed for wheezing or shortness of breath.   allopurinol 100 MG tablet Commonly known as: ZYLOPRIM Take 100 mg by mouth daily.   apixaban 5 MG Tabs tablet Commonly known as: ELIQUIS Take 1 tablet (5 mg total) by mouth 2 (two) times daily.   ascorbic acid 500 MG tablet Commonly known as: VITAMIN C Take 1 tablet (500 mg total) by mouth daily.   betamethasone dipropionate 0.05 % cream Apply topically 2 (two) times daily.   diltiazem 180 MG 24 hr capsule Commonly known as: CARDIZEM CD Take 1 capsule (180 mg total) by mouth daily. Start taking on: May 04, 2019   [facility] 200-5 MCG/ACT Aero Generic drug: mometasone-formoterol Inhale 1 puff into the lungs 2 (two) times daily.   ferrous sulfate 325 (65 FE) MG tablet Take 325 mg by mouth daily.   gentamicin cream 0.1  % Commonly known as: GARAMYCIN Apply 1 application topically 2 (two) times daily.   hydrALAZINE 100 MG tablet Commonly known as: APRESOLINE Take 0.5 tablets (50 mg total) by mouth 3 (three) times daily. What changed: how much to take   omeprazole 40 MG capsule Commonly known as: PRILOSEC Take 40 mg by mouth every morning.   polyethylene glycol 17 g packet Commonly known as: MIRALAX / GLYCOLAX Take 17 g by mouth daily as needed for mild constipation.   sodium bicarbonate 650 MG tablet Take 1 tablet (650 mg total) by mouth 3 (three) times daily.   Spiriva Respimat 1.25 MCG/ACT Aers Generic drug: Tiotropium Bromide Monohydrate Inhale 1 spray into the lungs daily.   torsemide 10 MG tablet Commonly known as: DEMADEX Take 10 mg by mouth daily.   vitamin B-12 1000 MCG tablet Commonly known as: CYANOCOBALAMIN Take 1 tablet (1,000 mcg total) by mouth daily.   zinc sulfate 220 (50 Zn) MG capsule Take 1 capsule (220 mg total) by mouth daily.            Durable Medical Equipment  (From admission, onward)  Start     Ordered   05/03/19 0736  For home use only DME Cane  Once     05/03/19 7793         Follow-up Information    [name], MD. Schedule an appointment as soon as possible for a visit in 1 week(s).   Specialty: Internal Medicine Contact information: [address] [place] [address] [phone]        Teodoro Spray, MD. Call in 4 week(s).   Specialty: Cardiology Contact information: [place] [address] [phone]          No Known Allergies   Procedures/Studies: DG Chest 2 View  Result Date: 04/28/2019 CLINICAL DATA:  Body aches EXAM: CHEST - 2 VIEW COMPARISON:  April 19, 2019 FINDINGS: There are persistent but improved peripheral bilateral airspace opacities. The heart size remains borderline enlarged. There is no pneumothorax. No large pleural effusion. There is no acute osseous abnormality. No  significant pleural effusion. IMPRESSION: Improving bilateral airspace opacities.  COPD. Electronically Signed   By: Constance Holster M.D.   On: 04/28/2019 21:51   DG Chest 2 View  Result Date: 04/14/2019 CLINICAL DATA:  Dyspnea on exertion for 1 week EXAM: CHEST - 2 VIEW COMPARISON:  04/01/2019 FINDINGS: Cardiac shadow is stable. Aortic calcifications are again seen. The lungs are well aerated bilaterally. No focal infiltrate or sizable effusion is seen. Chronic blunting of the right costophrenic angle is noted. Interval improved aeration in the left base is seen. IMPRESSION: Chronic blunting of the right costophrenic angle. No other focal abnormality is seen. Electronically Signed   By: Inez Catalina M.D.   On: 04/14/2019 15:22   DG Chest Portable 1 View  Result Date: 04/19/2019 CLINICAL DATA:  Shortness of breath. EXAM: PORTABLE CHEST 1 VIEW COMPARISON:  April 14, 2019 FINDINGS: There is been interval development of bilateral airspace opacities primarily in the mid lung zones bilaterally. The heart size remains enlarged. There is persistent blunting of the costophrenic angles. There is no acute osseous abnormality. Aortic calcifications are noted. The lungs appear to be somewhat hyperexpanded. IMPRESSION: 1. Interval development of bilateral airspace opacities concerning for developing multifocal pneumonia. Pulmonary edema can have a similar appearance in the appropriate clinical setting. 2. Hyperexpanded lungs, similar to prior study. 3. Persistent blunting of the costophrenic angles which may be secondary to small bilateral pleural effusions and or atelectasis. Electronically Signed   By: Constance Holster M.D.   On: 04/19/2019 20:06   ECHOCARDIOGRAM COMPLETE  Result Date: 04/30/2019   ECHOCARDIOGRAM REPORT   Patient Name:   [name] Date of Exam: 04/30/2019 Medical Rec #:  [id number]     Height:       70.0 in Accession #:    [id number]    Weight:       185.0 lb Date of Birth:  12/22/1950      BSA:          2.02 m Patient Age:    12 years      BP:           154/82 mmHg Patient Gender: M             HR:           58 bpm. Exam Location:  [facility] Procedure: 2D Echo, Cardiac Doppler and Color Doppler Indications:     Atrial Flutter 427.32  History:         Patient has prior history of Echocardiogram  examinations, most                  recent 11/12/2014. COPD, Signs/Symptoms:Shortness of Breath; Risk                  Factors:Diabetes.  Sonographer:     Sherrie Sport RDCS (AE) Referring Phys:  [id number] Diagnosing Phys: Yolonda Kida MD IMPRESSIONS  1. Left ventricular ejection fraction, by visual estimation, is 60 to 65%. The left ventricle has normal function. Left ventricular septal wall thickness was normal. Normal left ventricular posterior wall thickness. There is no left ventricular hypertrophy.  2. The left ventricle has no regional wall motion abnormalities.  3. Global right ventricle has mildly reduced systolic function.The right ventricular size is mildly enlarged. No increase in right ventricular wall thickness.  4. Left atrial size was mildly dilated.  5. Right atrial size was normal.  6. The mitral valve is myxomatous. Mild mitral valve regurgitation.  7. The tricuspid valve is normal in structure.  8. The tricuspid valve is normal in structure. Tricuspid valve regurgitation is trivial.  9. The aortic valve is normal in structure. Aortic valve regurgitation is mild. Mild to moderate aortic valve sclerosis/calcification without any evidence of aortic stenosis. 10. The pulmonic valve was grossly normal. Pulmonic valve regurgitation is not visualized. FINDINGS  Left Ventricle: Left ventricular ejection fraction, by visual estimation, is 60 to 65%. The left ventricle has normal function. The left ventricle has no regional wall motion abnormalities. Normal left ventricular posterior wall thickness. There is no left ventricular hypertrophy. Right Ventricle: The right ventricular size is mildly  enlarged. No increase in right ventricular wall thickness. Global RV systolic function is has mildly reduced systolic function. Left Atrium: Left atrial size was mildly dilated. Right Atrium: Right atrial size was normal in size Pericardium: There is no evidence of pericardial effusion. Mitral Valve: The mitral valve is myxomatous. Mild mitral valve regurgitation. Tricuspid Valve: The tricuspid valve is normal in structure. Tricuspid valve regurgitation is trivial. Aortic Valve: The aortic valve is normal in structure. Aortic valve regurgitation is mild. Mild to moderate aortic valve sclerosis/calcification is present, without any evidence of aortic stenosis. Aortic valve mean gradient measures 4.0 mmHg. Aortic valve peak gradient measures 6.6 mmHg. Aortic valve area, by VTI measures 2.82 cm. Pulmonic Valve: The pulmonic valve was grossly normal. Pulmonic valve regurgitation is not visualized. Pulmonic regurgitation is not visualized. Aorta: The aortic root is normal in size and structure. IAS/Shunts: No atrial level shunt detected by color flow Doppler.  LEFT VENTRICLE PLAX 2D LVIDd:         4.57 cm  Diastology LVIDs:         2.41 cm  LV e' lateral:   6.53 cm/s LV PW:         1.13 cm  LV E/e' lateral: 19.4 LV IVS:        1.70 cm  LV e' medial:    5.11 cm/s LVOT diam:     2.00 cm  LV E/e' medial:  24.9 LV SV:         75 ml LV SV Index:   36.84 LVOT Area:     3.14 cm  RIGHT VENTRICLE RV Basal diam:  4.40 cm RV S prime:     12.40 cm/s TAPSE (M-mode): 3.5 cm LEFT ATRIUM             Index       RIGHT ATRIUM  Index LA diam:        4.50 cm 2.23 cm/m  RA Area:     21.40 cm LA Vol (A2C):   76.2 ml 37.74 ml/m RA Volume:   68.80 ml  34.07 ml/m LA Vol (A4C):   47.8 ml 23.67 ml/m LA Biplane Vol: 64.6 ml 31.99 ml/m  AORTIC VALVE                   PULMONIC VALVE AV Area (Vmax):    2.69 cm    PV Vmax:        0.99 m/s AV Area (Vmean):   2.46 cm    PV Peak grad:   3.9 mmHg AV Area (VTI):     2.82 cm    RVOT Peak  grad: 5 mmHg AV Vmax:           128.50 cm/s AV Vmean:          89.800 cm/s AV VTI:            0.314 m AV Peak Grad:      6.6 mmHg AV Mean Grad:      4.0 mmHg LVOT Vmax:         110.00 cm/s LVOT Vmean:        70.200 cm/s LVOT VTI:          0.282 m LVOT/AV VTI ratio: 0.90  AORTA Ao Root diam: 3.00 cm MITRAL VALVE MV Area (PHT): 1.96 cm              SHUNTS MV PHT:        112.23 msec           Systemic VTI:  0.28 m MV Decel Time: 387 msec              Systemic Diam: 2.00 cm MV E velocity: 127.00 cm/s 103 cm/s MV A velocity: 106.00 cm/s 70.3 cm/s MV E/A ratio:  1.20        1.5  Dwayne D Callwood MD Electronically signed by Yolonda Kida MD Signature Date/Time: 04/30/2019/1:59:29 PM    Final        Subjective: Continues to feel better.  Heart rate stable on the monitor  Discharge Exam: Vitals:   05/03/19 0541 05/03/19 0825  BP: (!) 151/76 (!) 162/79  Pulse: 62 67  Resp: 20 16  Temp: 97.9 F (36.6 C) 97.9 F (36.6 C)  SpO2: 99% 100%   Vitals:   05/02/19 1444 05/02/19 2014 05/03/19 0541 05/03/19 0825  BP: (!) 157/84 (!) 156/80 (!) 151/76 (!) 162/79  Pulse: 61 70 62 67  Resp: 18 18 20 16   Temp:  98.2 F (36.8 C) 97.9 F (36.6 C) 97.9 F (36.6 C)  TempSrc:  Oral Oral Oral  SpO2: 98% 99% 99% 100%  Weight:   89.2 kg   Height:        General: Not in distress HEENT: Moist mucosa, supple neck Chest: Clear CVs: S1-S2 regular, no murmurs GI: Soft, nondistended, nontender Musculoskeletal: Warm, no edema   The results of significant diagnostics from this hospitalization (including imaging, microbiology, ancillary and laboratory) are listed below for reference.     Microbiology: No results found for this or any previous visit (from the past 240 hour(s)).   Labs: BNP (last 3 results) Recent Labs    04/19/19 1957  BNP 102.5*   Basic Metabolic Panel: Recent Labs  Lab 04/28/19 2113 04/29/19 0050 05/01/19 0711  NA 130* 132* 130*  K  4.4 4.4 4.5  CL 100 103 102  CO2 19* 20*  18*  GLUCOSE 134* 143* 410*  BUN 53* 53* 79*  CREATININE 3.70* 3.95* 3.66*  CALCIUM 7.7* 7.3* 7.1*  MG 1.4*  --  2.0   Liver Function Tests: No results for input(s): AST, ALT, ALKPHOS, BILITOT, PROT, ALBUMIN in the last 168 hours. No results for input(s): LIPASE, AMYLASE in the last 168 hours. No results for input(s): AMMONIA in the last 168 hours. CBC: Recent Labs  Lab 04/30/19 0620 05/01/19 0711 05/01/19 1702 05/02/19 0300 05/03/19 0419  WBC 4.6 8.2 8.3 9.7 7.8  HGB 8.7* 7.9* 7.8* 8.1* 7.8*  HCT 26.5* 23.5* 23.3* 24.5* 23.5*  MCV 86.9 87.4 87.3 87.5 88.3  PLT 129* 123* 122* 139* 121*   Cardiac Enzymes: No results for input(s): CKTOTAL, CKMB, CKMBINDEX, TROPONINI in the last 168 hours. BNP: Invalid input(s): POCBNP CBG: Recent Labs  Lab 05/02/19 1218 05/02/19 1720 05/02/19 2013 05/03/19 0827 05/03/19 1102  GLUCAP 174* 164* 217* 135* 147*   D-Dimer No results for input(s): DDIMER in the last 72 hours. Hgb A1c No results for input(s): HGBA1C in the last 72 hours. Lipid Profile No results for input(s): CHOL, HDL, LDLCALC, TRIG, CHOLHDL, LDLDIRECT in the last 72 hours. Thyroid function studies No results for input(s): TSH, T4TOTAL, T3FREE, THYROIDAB in the last 72 hours.  Invalid input(s): [name]3 Anemia work up No results for input(s): VITAMINB12, FOLATE, FERRITIN, TIBC, IRON, RETICCTPCT in the last 72 hours. Urinalysis    Component Value Date/Time   COLORURINE YELLOW (A) 04/29/2019 0049   APPEARANCEUR CLEAR (A) 04/29/2019 0049   APPEARANCEUR Clear 09/08/2013 0158   LABSPEC 1.013 04/29/2019 0049   LABSPEC 1.027 09/08/2013 0158   PHURINE 5.0 04/29/2019 0049   GLUCOSEU 50 (A) 04/29/2019 0049   GLUCOSEU >=500 09/08/2013 0158   HGBUR NEGATIVE 04/29/2019 0049   BILIRUBINUR NEGATIVE 04/29/2019 0049   BILIRUBINUR Negative 09/08/2013 0158   KETONESUR NEGATIVE 04/29/2019 0049   PROTEINUR >=300 (A) 04/29/2019 0049   UROBILINOGEN 2.0 (H) 07/03/2011 1333   NITRITE  NEGATIVE 04/29/2019 0049   LEUKOCYTESUR NEGATIVE 04/29/2019 0049   LEUKOCYTESUR 2+ 09/08/2013 0158   Sepsis Labs Invalid input(s): PROCALCITONIN,  WBC,  LACTICIDVEN Microbiology No results found for this or any previous visit (from the past 240 hour(s)).   Time coordinating discharge: 35 minutes  SIGNED:   Louellen Molder, MD  [facility] 05/03/2019, 12:02 PM Pager   If 7PM-7AM, please contact night-coverage [email or web address] Password [email or web address]

## 2019-05-04 ENCOUNTER — Other Ambulatory Visit: Payer: Self-pay

## 2019-05-04 ENCOUNTER — Telehealth: Payer: Self-pay | Admitting: Oncology

## 2019-05-04 NOTE — Telephone Encounter (Signed)
Phoned patient for appt reminder for 05-05-19. Patient stated that he had not been feeling good and was unsure if he had been tested for COVID. Upon review in Epic, patient tested positive for COVID on 04-28-19. Patient's appt at the [facility] on 05-05-19 was cancelled and all [place] appts were rescheduled accordingly. Patient was advised to phone other offices and reschedule any other appts.

## 2019-05-05 ENCOUNTER — Ambulatory Visit: Payer: Medicare Other | Admitting: Podiatry

## 2019-05-05 ENCOUNTER — Inpatient Hospital Stay: Payer: Medicare Other

## 2019-05-13 ENCOUNTER — Other Ambulatory Visit: Payer: Self-pay

## 2019-05-14 ENCOUNTER — Other Ambulatory Visit: Payer: Self-pay

## 2019-05-14 ENCOUNTER — Other Ambulatory Visit: Payer: Self-pay | Admitting: Oncology

## 2019-05-14 ENCOUNTER — Inpatient Hospital Stay: Payer: Medicare Other | Attending: Oncology

## 2019-05-14 ENCOUNTER — Inpatient Hospital Stay: Payer: Medicare Other

## 2019-05-14 VITALS — BP 143/77 | HR 63

## 2019-05-14 DIAGNOSIS — L299 Pruritus, unspecified: Secondary | ICD-10-CM | POA: Insufficient documentation

## 2019-05-14 DIAGNOSIS — E1122 Type 2 diabetes mellitus with diabetic chronic kidney disease: Secondary | ICD-10-CM | POA: Insufficient documentation

## 2019-05-14 DIAGNOSIS — D509 Iron deficiency anemia, unspecified: Secondary | ICD-10-CM

## 2019-05-14 DIAGNOSIS — D472 Monoclonal gammopathy: Secondary | ICD-10-CM | POA: Insufficient documentation

## 2019-05-14 DIAGNOSIS — D649 Anemia, unspecified: Secondary | ICD-10-CM

## 2019-05-14 DIAGNOSIS — I130 Hypertensive heart and chronic kidney disease with heart failure and stage 1 through stage 4 chronic kidney disease, or unspecified chronic kidney disease: Secondary | ICD-10-CM | POA: Diagnosis present

## 2019-05-14 DIAGNOSIS — D631 Anemia in chronic kidney disease: Secondary | ICD-10-CM

## 2019-05-14 DIAGNOSIS — N189 Chronic kidney disease, unspecified: Secondary | ICD-10-CM | POA: Diagnosis not present

## 2019-05-14 DIAGNOSIS — Z79899 Other long term (current) drug therapy: Secondary | ICD-10-CM | POA: Insufficient documentation

## 2019-05-14 LAB — CBC WITH DIFFERENTIAL/PLATELET
Abs Immature Granulocytes: 0.21 10*3/uL — ABNORMAL HIGH (ref 0.00–0.07)
Basophils Absolute: 0 10*3/uL (ref 0.0–0.1)
Basophils Relative: 0 %
Eosinophils Absolute: 0.4 10*3/uL (ref 0.0–0.5)
Eosinophils Relative: 6 %
HCT: 19.8 % — ABNORMAL LOW (ref 39.0–52.0)
Hemoglobin: 5.8 g/dL — ABNORMAL LOW (ref 13.0–17.0)
Immature Granulocytes: 3 %
Lymphocytes Relative: 14 %
Lymphs Abs: 1 10*3/uL (ref 0.7–4.0)
MCH: 28.3 pg (ref 26.0–34.0)
MCHC: 29.3 g/dL — ABNORMAL LOW (ref 30.0–36.0)
MCV: 96.6 fL (ref 80.0–100.0)
Monocytes Absolute: 1.1 10*3/uL — ABNORMAL HIGH (ref 0.1–1.0)
Monocytes Relative: 15 %
Neutro Abs: 4.6 10*3/uL (ref 1.7–7.7)
Neutrophils Relative %: 62 %
Platelets: 234 10*3/uL (ref 150–400)
RBC: 2.05 MIL/uL — ABNORMAL LOW (ref 4.22–5.81)
RDW: 15 % (ref 11.5–15.5)
WBC: 7.4 10*3/uL (ref 4.0–10.5)
nRBC: 0 % (ref 0.0–0.2)

## 2019-05-14 LAB — FERRITIN: Ferritin: 218 ng/mL (ref 24–336)

## 2019-05-14 LAB — IRON AND TIBC
Iron: 65 ug/dL (ref 45–182)
Saturation Ratios: 23 % (ref 17.9–39.5)
TIBC: 284 ug/dL (ref 250–450)
UIBC: 219 ug/dL

## 2019-05-14 LAB — SAMPLE TO BLOOD BANK

## 2019-05-14 MED ORDER — EPOETIN ALFA-EPBX 40000 UNIT/ML IJ SOLN: 50000.0000 [IU] | Freq: Once | INTRAMUSCULAR | Status: DC

## 2019-05-14 MED ORDER — EPOETIN ALFA-EPBX 10000 UNIT/ML IJ SOLN
10000.0000 [IU] | Freq: Once | INTRAMUSCULAR | Status: AC
  Administered 2019-05-14: 10000 [IU] via SUBCUTANEOUS
  Filled 2019-05-14: qty 1

## 2019-05-14 MED ORDER — EPOETIN ALFA-EPBX 40000 UNIT/ML IJ SOLN
40000.0000 [IU] | Freq: Once | INTRAMUSCULAR | Status: AC
  Administered 2019-05-14: 11:00:00 40000 [IU] via SUBCUTANEOUS
  Filled 2019-05-14: qty 1

## 2019-05-15 ENCOUNTER — Other Ambulatory Visit: Payer: Self-pay

## 2019-05-15 ENCOUNTER — Inpatient Hospital Stay: Payer: Medicare Other

## 2019-05-15 DIAGNOSIS — I130 Hypertensive heart and chronic kidney disease with heart failure and stage 1 through stage 4 chronic kidney disease, or unspecified chronic kidney disease: Secondary | ICD-10-CM | POA: Diagnosis not present

## 2019-05-15 DIAGNOSIS — D649 Anemia, unspecified: Secondary | ICD-10-CM

## 2019-05-15 LAB — URINALYSIS, COMPLETE (UACMP) WITH MICROSCOPIC
Bilirubin Urine: NEGATIVE
Glucose, UA: 50 mg/dL — AB
Hgb urine dipstick: NEGATIVE
Ketones, ur: NEGATIVE mg/dL
Leukocytes,Ua: NEGATIVE
Nitrite: NEGATIVE
Protein, ur: >=300 mg/dL — AB
Specific Gravity, Urine: 1.015 (ref 1.005–1.030)
pH: 6 (ref 5.0–8.0)

## 2019-05-15 LAB — TRANSFUSION REACTION
DAT C3: NEGATIVE
Post RXN DAT IgG: NEGATIVE

## 2019-05-15 LAB — PREPARE RBC (CROSSMATCH)

## 2019-05-15 MED ORDER — FUROSEMIDE 10 MG/ML IJ SOLN
20.0000 mg | Freq: Once | INTRAMUSCULAR | Status: AC
  Administered 2019-05-15: 20 mg via INTRAVENOUS
  Filled 2019-05-15: qty 2

## 2019-05-15 MED ORDER — METHYLPREDNISOLONE SODIUM SUCC 125 MG IJ SOLR
125.0000 mg | Freq: Once | INTRAMUSCULAR | Status: AC
  Administered 2019-05-15: 125 mg via INTRAVENOUS

## 2019-05-15 MED ORDER — SODIUM CHLORIDE 0.9 % IV SOLN
INTRAVENOUS | Status: DC
  Filled 2019-05-15: qty 250

## 2019-05-15 MED ORDER — DIPHENHYDRAMINE HCL 50 MG/ML IJ SOLN
25.0000 mg | Freq: Once | INTRAMUSCULAR | Status: AC
  Administered 2019-05-15: 25 mg via INTRAVENOUS

## 2019-05-15 MED ORDER — SODIUM CHLORIDE 0.9% IV SOLUTION
250.0000 mL | Freq: Once | INTRAVENOUS | Status: AC
  Administered 2019-05-15: 250 mL via INTRAVENOUS
  Filled 2019-05-15: qty 250

## 2019-05-15 MED ORDER — FAMOTIDINE IN NACL 20-0.9 MG/50ML-% IV SOLN
20.0000 mg | Freq: Once | INTRAVENOUS | Status: AC
  Administered 2019-05-15: 20 mg via INTRAVENOUS

## 2019-05-15 NOTE — Progress Notes (Signed)
At [phone],  Mr. Russett started complaining of itching on his lower back while getting his first unit blood. While assessing Mr. Mcbroom back, I noticed hives and redness. Blood transfusion stopped immediately. NS blous started at 0947. Sonia Baller, NP notified and she came to assess patient.  At [phone] patient received 25 mg of Benadryl and 125 mg of solumedrol. IV pepcid given at 0955.  Patient expressed shortly after that his itching had subsided. No signs of shortness of breath, back pain or fever.  Blood bank notified of reaction and the ordered labs was drawn. Blood bank notified us that blood transfusion can be restarted. Dr. Janese Banks agreed to restart of blood transfusion.  Blood was restarted at 1138 and another 25 mg of benadryl was given at 1137.  Blood transfusion was completed at 1240. No other signs reaction was noticed during the remainder of blood transfusion except of an elevated BP. Dr. Janese Banks was made aware and ordered 20 mg of lasix to be given between the first and second unit of blood. Shortly after the Lasix was given, the patient BP was 196/102 and then 204/98. Sonia Baller ,NP made aware and decided that patient should not receive his 2nd unit of blood with an elevated BP.  Dr. Janese Banks agreed to send patient home and will attempt to give him more blood next week. Educated patient to take his BP medications when he get home and go to the emergency room if he start to not feel well. Patient agreed. IV removed and he was sent home.

## 2019-05-16 LAB — TYPE AND SCREEN
ABO/RH(D): A POS
Antibody Screen: NEGATIVE
Unit division: 0
Unit division: 0

## 2019-05-16 LAB — BPAM RBC
Blood Product Expiration Date: 202102102359
Blood Product Expiration Date: 202102102359
ISSUE DATE / TIME: 202102050857
ISSUE DATE / TIME: 202102051520
Unit Type and Rh: 6200
Unit Type and Rh: 6200

## 2019-05-17 ENCOUNTER — Other Ambulatory Visit: Payer: Self-pay | Admitting: *Deleted

## 2019-05-17 DIAGNOSIS — D649 Anemia, unspecified: Secondary | ICD-10-CM

## 2019-05-17 DIAGNOSIS — D631 Anemia in chronic kidney disease: Secondary | ICD-10-CM

## 2019-05-19 ENCOUNTER — Other Ambulatory Visit: Payer: Self-pay

## 2019-05-19 ENCOUNTER — Other Ambulatory Visit: Payer: Self-pay | Admitting: Oncology

## 2019-05-19 DIAGNOSIS — D649 Anemia, unspecified: Secondary | ICD-10-CM

## 2019-05-20 ENCOUNTER — Other Ambulatory Visit: Payer: Self-pay

## 2019-05-20 ENCOUNTER — Inpatient Hospital Stay: Payer: Medicare Other

## 2019-05-20 ENCOUNTER — Telehealth: Payer: Self-pay | Admitting: *Deleted

## 2019-05-20 DIAGNOSIS — D631 Anemia in chronic kidney disease: Secondary | ICD-10-CM

## 2019-05-20 DIAGNOSIS — D649 Anemia, unspecified: Secondary | ICD-10-CM

## 2019-05-20 DIAGNOSIS — I130 Hypertensive heart and chronic kidney disease with heart failure and stage 1 through stage 4 chronic kidney disease, or unspecified chronic kidney disease: Secondary | ICD-10-CM | POA: Diagnosis not present

## 2019-05-20 LAB — CBC
HCT: 28.5 % — ABNORMAL LOW (ref 39.0–52.0)
Hemoglobin: 8.7 g/dL — ABNORMAL LOW (ref 13.0–17.0)
MCH: 29.6 pg (ref 26.0–34.0)
MCHC: 30.5 g/dL (ref 30.0–36.0)
MCV: 96.9 fL (ref 80.0–100.0)
Platelets: 206 10*3/uL (ref 150–400)
RBC: 2.94 MIL/uL — ABNORMAL LOW (ref 4.22–5.81)
RDW: 18.2 % — ABNORMAL HIGH (ref 11.5–15.5)
WBC: 8.1 10*3/uL (ref 4.0–10.5)
nRBC: 0 % (ref 0.0–0.2)

## 2019-05-20 LAB — TYPE AND SCREEN
ABO/RH(D): A POS
Antibody Screen: NEGATIVE

## 2019-05-20 NOTE — Telephone Encounter (Signed)
[name] with [facility] called reporting that patient b/p is elevated this morning 240/100, P- 80, R- 20, Afibrile T- 97 O2 sat 96%. Patient has a lab appointment today and is asking if he should come in for this today. He also has an appointment for transfusion tomorrow. Please advise.

## 2019-05-20 NOTE — Telephone Encounter (Signed)
Called pt and brother in law and let them know that we got a call that b/p was high. He has an appt with PCP Dr. Lamonte Sakai on Monday coming up. I did call the PCP office and left message on nursing line stating the b/p and needs help adjusting to get better readings. Also that he has not been able to get his retacrit inj. To help with hgb due to b/p elevated and sometimes has to get blood transfusion because he can't get retacrit due to b/p and the hgb drops down to the level he gets transfusion-the last one was 05/15/19. I also called Dr. Candiss Norse office and they moved the appt for him from 2/23 to 2/15 at 12:40. I have called back to [facility] to let him know the appt was changed. Pt has another appt 2/25 to get next injection.

## 2019-05-21 ENCOUNTER — Inpatient Hospital Stay: Payer: Medicare Other

## 2019-05-25 ENCOUNTER — Encounter: Payer: Self-pay | Admitting: Oncology

## 2019-05-25 ENCOUNTER — Other Ambulatory Visit: Payer: Self-pay

## 2019-05-25 ENCOUNTER — Inpatient Hospital Stay (HOSPITAL_BASED_OUTPATIENT_CLINIC_OR_DEPARTMENT_OTHER): Payer: Medicare Other | Admitting: Oncology

## 2019-05-25 VITALS — BP 220/104 | Wt 178.1 lb

## 2019-05-25 DIAGNOSIS — D509 Iron deficiency anemia, unspecified: Secondary | ICD-10-CM | POA: Diagnosis not present

## 2019-05-25 DIAGNOSIS — Z8719 Personal history of other diseases of the digestive system: Secondary | ICD-10-CM | POA: Diagnosis not present

## 2019-05-25 DIAGNOSIS — D472 Monoclonal gammopathy: Secondary | ICD-10-CM | POA: Diagnosis not present

## 2019-05-25 DIAGNOSIS — I130 Hypertensive heart and chronic kidney disease with heart failure and stage 1 through stage 4 chronic kidney disease, or unspecified chronic kidney disease: Secondary | ICD-10-CM | POA: Diagnosis not present

## 2019-05-25 MED ORDER — PREDNISONE 10 MG (21) PO TBPK: ORAL_TABLET | ORAL | 0 refills | Status: AC

## 2019-05-25 NOTE — Progress Notes (Signed)
Hematology/Oncology Consult note [facility]  Telephone:([phone] [phone]  Patient Care Team: [name], MD as PCP - General (Internal Medicine) Sindy Guadeloupe, MD as Consulting Physician (Hematology and Oncology)   Name of the patient: [name]  [id number]  10-28-1950   Date of visit: 05/25/19  Diagnosis- 1.Anemia- multifactorial- iron deficiency, chronic disease and anemia of kidney disease  2. 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 CBaruch Gouty Patient had CT abdomen in July 2016 which showed retroperitoneal, celiac and gastric adenopathy. MR pelvis in Oct 2017 showed b/l inguinal and external iliac adenopathy as well. Inguinal LN biopsy was negative for malignancy.   2. Biopsy of the skin lesion showed: DIAGNOSIS:  A. PERIANAL POLYP, LEFT; EXCISION:  - SKIN WITH POLYPOID VASCULAR PROLIFERATION WITH SUPERFICIAL NECROSIS,  SEE COMMENT.   B. PERIANAL MASS, RIGHT; INCISIONAL BIOPSY:  - INVASIVE CARCINOMA WITH BASALOID FEATURES.  - SEE COMMENT.   Comment #1:  The differential diagnosis for the perianal polyp includes pyogenic  granuloma and ulcerated hemangioma.   Comment #2:  Immunohistochemical stains were performed. The carcinoma is positive for  p16, BerEp4, and BCL-2. Stain controls worked appropriately.  Unfortunately the pattern of staining does not assist with further  classification of the carcinoma. Clinical correlation is required to  determine if the lesion involves perianal skin primarily (BCC) or is  arising in the anal canal (squamous cell carcinoma with basaloid  features).   This was followed by RT to the lesion.  3. He had  repeat CT abdomen in feb 2018 which showed: IMPRESSION: Progressive hepatic cirrhosis since prior study. No evidence of hepatic neoplasm.  New moderate ascites, diffuse mesenteric and body wall edema, and small bilateral pleural effusions. Mild diffuse small bowel wall thickening, consistent with hypoalbuminemia.  Findings consistent with portal venous hypertension, including mild splenomegaly and upper abdominal venous collaterals.  Stable mild abdominal lymphadenopathy. Mild increase in bilateral iliac and inguinal lymphadenopathy in the pelvis, which may be reactive in etiology, with metastatic disease considered less Likely.  4. Heunderwentegd and colonoscopy for his microcytic anemiawhich showed no overt bleeding lesions or malignancy  5. CBC on 09/18/2016 showed H&H of 6.5/19.8 and MCV of 86.7. Labs suggestive of iron deficiency anemia. B12 and folate was within normal limits. Multiple myeloma panel showed IGG monoclonal protein of 0.9 gm. Polyclonal gammopathyHaptoglobin and no copper was normal. Reticulocyte count was mildly elevated at 7.9. Patient received 2 doses of ferriheme on 620 and 10/03/2016. He also received 1 unit of blood transfusion  6. EGD showed a normal duodenum and normal esophagus. Found to have gastritis which was biopsied. Also found to have a large polypoid mass in the left piriform sinus and nonobstructing the airway. This was subsequently evaluated by ENT which did not find any evidence of mass. The colonoscopy showed appendixthat was appearing protruding and ordered this blood with stool. No mucus seen in the vicinity. He was evaluated by a [facility] and has been considered for a repeat colonoscopy versus endoscopy mucosal resection of the mucocele  7.Bonemarrowbiopsy showed hypercellular marrow for age. 9% plasma cells. Clonality of cells could not be evaluated because of lack of clot sections available  for staining. Cytogenetics were normal.  FISH studies were negative for any chromosomal abnormalities as well.  8. Patient received procrit for anemia of chronic kidney disease with improvement of his anemia to 10  Interval history-patient reports that ever since he received blood transfusion over a week ago he has been having intense itching all over.  He has used as needed Benadryl which has not helped him much.  Reports that his appetite has generally declined over the last 1 month and he has been losing weight.  ECOG PS- 1 Pain scale- 0   Review of systems- Review of Systems  Constitutional: Positive for malaise/fatigue. Negative for chills, fever and weight loss.  HENT: Negative for congestion, ear discharge and nosebleeds.   Eyes: Negative for blurred vision.  Respiratory: Negative for cough, hemoptysis, sputum production, shortness of breath and wheezing.   Cardiovascular: Negative for chest pain, palpitations, orthopnea and claudication.  Gastrointestinal: Negative for abdominal pain, blood in stool, constipation, diarrhea, heartburn, melena, nausea and vomiting.  Genitourinary: Negative for dysuria, flank pain, frequency, hematuria and urgency.  Musculoskeletal: Negative for back pain, joint pain and myalgias.  Skin: Positive for itching. Negative for rash.  Neurological: Negative for dizziness, tingling, focal weakness, seizures, weakness and headaches.  Endo/Heme/Allergies: Does not bruise/bleed easily.  Psychiatric/Behavioral: Negative for depression and suicidal ideas. The patient does not have insomnia.       No Known Allergies   Past Medical History:  Diagnosis Date  . Anemia   . Cancer ([place]) 11/02/2014   PERIANAL MASS, RIGHT; INCISIONAL BIOPSY: INVASIVE CARCINOMA WITH BASALOID FEATURES  . CHF (congestive heart failure) ([place])   . Chronic kidney disease   . COPD (chronic obstructive pulmonary disease) ([place])    NO INHALERS  . Diabetes mellitus    NO MEDS  . GERD (gastroesophageal reflux disease)   .  Hepatic cirrhosis ([place])   . Hypertension   . Leg swelling   . Loose, teeth    PATIENT STATES "HAS 6 TEETH, SOME ARE LOOSE"  . Neuromuscular disorder ([place])    RIGHT HAND AND FINGERS NUMB  . Shortness of breath dyspnea    WITH EXERTION   . 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 02/24/2016     Past Surgical History:  Procedure Laterality Date  . APPENDECTOMY    . COLONOSCOPY WITH PROPOFOL N/A 11/19/2014   Procedure: COLONOSCOPY WITH PROPOFOL;  Surgeon: Lucilla Lame, MD;  Location: [place];  Service: Endoscopy;  Laterality: N/A;  DIABETIC-ORAL MEDS  . COLONOSCOPY WITH PROPOFOL N/A 10/04/2016   Procedure: COLONOSCOPY WITH PROPOFOL;  Surgeon: Jonathon Bellows, MD;  Location: [facility] ENDOSCOPY;  Service: Endoscopy;  Laterality: N/A;  . ESOPHAGOGASTRODUODENOSCOPY (EGD) WITH PROPOFOL N/A 10/04/2016   Procedure: ESOPHAGOGASTRODUODENOSCOPY (EGD) WITH PROPOFOL;  Surgeon: Jonathon Bellows, MD;  Location: [facility] ENDOSCOPY;  Service: Endoscopy;  Laterality: N/A;  . GIVENS CAPSULE STUDY N/A 11/13/2016   Procedure: GIVENS CAPSULE STUDY;  Surgeon: Jonathon Bellows, MD;  Location: [facility] ENDOSCOPY;  Service: Gastroenterology;  Laterality: N/A;  . INCISION AND DRAINAGE PERIRECTAL ABSCESS Right 12/16/2017   Procedure: EXCISION BASAL CELL CANCER/ GLUTEAL ABSCESS AND FLAP COVERAGE;  Surgeon: Robert Bellow, MD;  Location: [facility] ORS;  Service: General;  Laterality: Right;  . INGUINAL LYMPH NODE BIOPSY Right 01/26/2016   Procedure: INGUINAL LYMPH NODE BIOPSY;  Surgeon: Clayburn Pert, MD;  Location: [facility] ORS;  Service: General;  Laterality: Right;  . RECTAL BIOPSY N/A 11/02/2014   Procedure: BIOPSY RECTAL;  Surgeon: Marlyce Huge, MD;  Location[facility]  ORS;  Service: General;  Laterality: N/A;  . RECTAL EXAM UNDER ANESTHESIA N/A 11/02/2014   Procedure: RECTAL EXAM UNDER ANESTHESIA;  Surgeon: Marlyce Huge, MD;  Location: [facility] ORS;  Service: General;  Laterality: N/A;  . TONSILLECTOMY      Social History    Socioeconomic History  . Marital status: Divorced    Spouse name: Not on file  . Number of children: Not on file  . Years of education: Not on file  . Highest education level: Not on file  Occupational History  . Not on file  Tobacco Use  . Smoking status: Former Smoker    Packs/day: 0.25    Years: 20.00    Pack years: 5.00    Types: Cigarettes  . Smokeless tobacco: Never Used  . Tobacco comment: 3-4 cigarettes per day  Substance and Sexual Activity  . Alcohol use: Not Currently  . Drug use: Not Currently    Comment: Last Use 2016 per patient-+ UDS FOR COCAINE IN 2014  . Sexual activity: Never  Other Topics Concern  . Not on file  Social History Narrative  . Not on file   Social Determinants of Health   Financial Resource Strain:   . Difficulty of Paying Living Expenses: Not on file  Food Insecurity:   . Worried About Charity fundraiser in the Last Year: Not on file  . Ran Out of Food in the Last Year: Not on file  Transportation Needs:   . Lack of Transportation (Medical): Not on file  . Lack of Transportation (Non-Medical): Not on file  Physical Activity:   . Days of Exercise per Week: Not on file  . Minutes of Exercise per Session: Not on file  Stress:   . Feeling of Stress : Not on file  Social Connections:   . Frequency of Communication with Friends and Family: Not on file  . Frequency of Social Gatherings with Friends and Family: Not on file  . Attends Religious Services: Not on file  . Active Member of Clubs or Organizations: Not on file  . Attends Archivist Meetings: Not on file  . Marital Status: Not on file  Intimate Partner Violence:   . Fear of Current or Ex-Partner: Not on file  . Emotionally Abused: Not on file  . Physically Abused: Not on file  . Sexually Abused: Not on file    Family History  Problem Relation Age of Onset  . Asthma Mother   . Colon cancer Neg Hx      Current Outpatient Medications:  .  albuterol (VENTOLIN  HFA) 108 (90 Base) MCG/ACT inhaler, Inhale 2 puffs into the lungs every 6 (six) hours as needed for wheezing or shortness of breath., Disp: , Rfl:  .  allopurinol (ZYLOPRIM) 100 MG tablet, Take 100 mg by mouth daily., Disp: , Rfl:  .  apixaban (ELIQUIS) 5 MG TABS tablet, Take 1 tablet (5 mg total) by mouth 2 (two) times daily., Disp: 60 tablet, Rfl: 1 .  diltiazem (CARDIZEM CD) 180 MG 24 hr capsule, Take 1 capsule (180 mg total) by mouth daily., Disp: 30 capsule, Rfl: 1 .  DULERA 200-5 MCG/ACT AERO, Inhale 1 puff into the lungs 2 (two) times daily., Disp: , Rfl:  .  ferrous sulfate 325 (65 FE) MG tablet, Take 325 mg by mouth daily. , Disp: , Rfl:  .  hydrALAZINE (APRESOLINE) 100 MG tablet, Take 0.5 tablets (50 mg total) by mouth 3 (three) times daily., Disp: 30 tablet,  Rfl: 0 .  omeprazole (PRILOSEC) 40 MG capsule, Take 40 mg by mouth every morning. , Disp: , Rfl:  .  SPIRIVA RESPIMAT 1.25 MCG/ACT AERS, Inhale 1 spray into the lungs daily., Disp: , Rfl:  .  torsemide (DEMADEX) 10 MG tablet, Take 10 mg by mouth daily. , Disp: , Rfl:  .  vitamin B-12 (CYANOCOBALAMIN) 1000 MCG tablet, Take 1 tablet (1,000 mcg total) by mouth daily., Disp: 30 tablet, Rfl: 3 .  betamethasone dipropionate 0.05 % cream, Apply topically 2 (two) times daily. (Patient not taking: Reported on 05/25/2019), Disp: 45 g, Rfl: 3 .  polyethylene glycol (MIRALAX / GLYCOLAX) packet, Take 17 g by mouth daily as needed for mild constipation. , Disp: , Rfl:  .  predniSONE (STERAPRED UNI-PAK 21 TAB) 10 MG (21) TBPK tablet, 50 mg X2 days, 40 mg X2 days, 30 mg X2 days, 20 mg X2 days 10 mg X2 days then stop, Disp: 30 tablet, Rfl: 0 .  sodium bicarbonate 650 MG tablet, Take 1 tablet (650 mg total) by mouth 3 (three) times daily. (Patient not taking: Reported on 05/25/2019), Disp: 20 tablet, Rfl: 0 No current facility-administered medications for this visit.  Facility-Administered Medications Ordered in Other Visits:  .  epoetin alfa-epbx  (RETACRIT) injection 10,000 Units, 10,000 Units, Subcutaneous, Once **AND** [DISCONTINUED] epoetin alfa-epbx (RETACRIT) injection 40,000 Units, 40,000 Units, Subcutaneous, Once, Sindy Guadeloupe, MD  Physical exam:  Vitals:   05/25/19 1037  BP: (!) 220/104  Weight: 178 lb 1.6 oz (80.8 kg)   Physical Exam HENT:     Head: Normocephalic and atraumatic.  Eyes:     Pupils: Pupils are equal, round, and reactive to light.  Cardiovascular:     Rate and Rhythm: Normal rate and regular rhythm.     Heart sounds: Normal heart sounds.  Pulmonary:     Effort: Pulmonary effort is normal.     Breath sounds: Normal breath sounds.  Abdominal:     General: Bowel sounds are normal.     Palpations: Abdomen is soft.  Musculoskeletal:     Cervical back: Normal range of motion.  Skin:    General: Skin is warm and dry.     Comments: Scratch marks are seen especially over the back with no overt rash noted  Neurological:     Mental Status: He is alert and oriented to person, place, and time.      CMP Latest Ref Rng & Units 05/01/2019  Glucose 70 - 99 mg/dL 410(H)  BUN 8 - 23 mg/dL 79(H)  Creatinine 0.61 - 1.24 mg/dL 3.66(H)  Sodium 135 - 145 mmol/L 130(L)  Potassium 3.5 - 5.1 mmol/L 4.5  Chloride 98 - 111 mmol/L 102  CO2 22 - 32 mmol/L 18(L)  Calcium 8.9 - 10.3 mg/dL 7.1(L)  Total Protein 6.5 - 8.1 g/dL -  Total Bilirubin 0.3 - 1.2 mg/dL -  Alkaline Phos 38 - 126 U/L -  AST 15 - 41 U/L -  ALT 0 - 44 U/L -   CBC Latest Ref Rng & Units 05/20/2019  WBC 4.0 - 10.5 K/uL 8.1  Hemoglobin 13.0 - 17.0 g/dL 8.7(L)  Hematocrit 39.0 - 52.0 % 28.5(L)  Platelets 150 - 400 K/uL 206    No images are attached to the encounter.  DG Chest 2 View  Result Date: 04/28/2019 CLINICAL DATA:  Body aches EXAM: CHEST - 2 VIEW COMPARISON:  April 19, 2019 FINDINGS: There are persistent but improved peripheral bilateral airspace opacities. The heart size  remains borderline enlarged. There is no pneumothorax. No large  pleural effusion. There is no acute osseous abnormality. No significant pleural effusion. IMPRESSION: Improving bilateral airspace opacities.  COPD. Electronically Signed   By: Constance Holster M.D.   On: 04/28/2019 21:51   ECHOCARDIOGRAM COMPLETE  Result Date: 04/30/2019   ECHOCARDIOGRAM REPORT   Patient Name:   [name] Date of Exam: 04/30/2019 Medical Rec #:  [id number]     Height:       70.0 in Accession #:    [id number]    Weight:       185.0 lb Date of Birth:  1950-11-09     BSA:          2.02 m Patient Age:    37 years      BP:           154/82 mmHg Patient Gender: M             HR:           58 bpm. Exam Location:  [facility] Procedure: 2D Echo, Cardiac Doppler and Color Doppler Indications:     Atrial Flutter 427.32  History:         Patient has prior history of Echocardiogram examinations, most                  recent 11/12/2014. COPD, Signs/Symptoms:Shortness of Breath; Risk                  Factors:Diabetes.  Sonographer:     Sherrie Sport RDCS (AE) Referring Phys:  [id number] [facility] Diagnosing Phys: Yolonda Kida MD IMPRESSIONS  1. Left ventricular ejection fraction, by visual estimation, is 60 to 65%. The left ventricle has normal function. Left ventricular septal wall thickness was normal. Normal left ventricular posterior wall thickness. There is no left ventricular hypertrophy.  2. The left ventricle has no regional wall motion abnormalities.  3. Global right ventricle has mildly reduced systolic function.The right ventricular size is mildly enlarged. No increase in right ventricular wall thickness.  4. Left atrial size was mildly dilated.  5. Right atrial size was normal.  6. The mitral valve is myxomatous. Mild mitral valve regurgitation.  7. The tricuspid valve is normal in structure.  8. The tricuspid valve is normal in structure. Tricuspid valve regurgitation is trivial.  9. The aortic valve is normal in structure. Aortic valve regurgitation is mild. Mild to moderate aortic valve  sclerosis/calcification without any evidence of aortic stenosis. 10. The pulmonic valve was grossly normal. Pulmonic valve regurgitation is not visualized. FINDINGS  Left Ventricle: Left ventricular ejection fraction, by visual estimation, is 60 to 65%. The left ventricle has normal function. The left ventricle has no regional wall motion abnormalities. Normal left ventricular posterior wall thickness. There is no left ventricular hypertrophy. Right Ventricle: The right ventricular size is mildly enlarged. No increase in right ventricular wall thickness. Global RV systolic function is has mildly reduced systolic function. Left Atrium: Left atrial size was mildly dilated. Right Atrium: Right atrial size was normal in size Pericardium: There is no evidence of pericardial effusion. Mitral Valve: The mitral valve is myxomatous. Mild mitral valve regurgitation. Tricuspid Valve: The tricuspid valve is normal in structure. Tricuspid valve regurgitation is trivial. Aortic Valve: The aortic valve is normal in structure. Aortic valve regurgitation is mild. Mild to moderate aortic valve sclerosis/calcification is present, without any evidence of aortic stenosis. Aortic valve mean gradient measures 4.0 mmHg. Aortic valve peak gradient measures  6.6 mmHg. Aortic valve area, by VTI measures 2.82 cm. Pulmonic Valve: The pulmonic valve was grossly normal. Pulmonic valve regurgitation is not visualized. Pulmonic regurgitation is not visualized. Aorta: The aortic root is normal in size and structure. IAS/Shunts: No atrial level shunt detected by color flow Doppler.  LEFT VENTRICLE PLAX 2D LVIDd:         4.57 cm  Diastology LVIDs:         2.41 cm  LV e' lateral:   6.53 cm/s LV PW:         1.13 cm  LV E/e' lateral: 19.4 LV IVS:        1.70 cm  LV e' medial:    5.11 cm/s LVOT diam:     2.00 cm  LV E/e' medial:  24.9 LV SV:         75 ml LV SV Index:   36.84 LVOT Area:     3.14 cm  RIGHT VENTRICLE RV Basal diam:  4.40 cm RV S prime:      12.40 cm/s TAPSE (M-mode): 3.5 cm LEFT ATRIUM             Index       RIGHT ATRIUM           Index LA diam:        4.50 cm 2.23 cm/m  RA Area:     21.40 cm LA Vol (A2C):   76.2 ml 37.74 ml/m RA Volume:   68.80 ml  34.07 ml/m LA Vol (A4C):   47.8 ml 23.67 ml/m LA Biplane Vol: 64.6 ml 31.99 ml/m  AORTIC VALVE                   PULMONIC VALVE AV Area (Vmax):    2.69 cm    PV Vmax:        0.99 m/s AV Area (Vmean):   2.46 cm    PV Peak grad:   3.9 mmHg AV Area (VTI):     2.82 cm    RVOT Peak grad: 5 mmHg AV Vmax:           128.50 cm/s AV Vmean:          89.800 cm/s AV VTI:            0.314 m AV Peak Grad:      6.6 mmHg AV Mean Grad:      4.0 mmHg LVOT Vmax:         110.00 cm/s LVOT Vmean:        70.200 cm/s LVOT VTI:          0.282 m LVOT/AV VTI ratio: 0.90  AORTA Ao Root diam: 3.00 cm MITRAL VALVE MV Area (PHT): 1.96 cm              SHUNTS MV PHT:        112.23 msec           Systemic VTI:  0.28 m MV Decel Time: 387 msec              Systemic Diam: 2.00 cm MV E velocity: 127.00 cm/s 103 cm/s MV A velocity: 106.00 cm/s 70.3 cm/s MV E/A ratio:  1.20        1.5  Dwayne D Callwood MD Electronically signed by Yolonda Kida MD Signature Date/Time: 04/30/2019/1:59:29 PM    Final      Assessment and plan- Patient is a 69 y.o. male with anemia of chronic kidney disease  along with component of iron deficiency and IgA MGUS   Pruritus: Etiology unclear it has been more than 1 week since his blood transfusion.  He is on Benadryl has not helped.  I will proceed with 10-day course of prednisone starting at 50 mg and taper by 10 mg every other day.  History of cirrhosis: I will obtain a CT abdomen and pelvis without contrast given his CKD given his ongoing symptoms of decreased appetite.  I will add a CMP and AFP to his next set of labs  Anemia: Likely secondary to CKD which has in general been responsive to EPO.  However patient's blood pressure remains uncontrolled during most of his visits and he has not been  able to consistently get his EPO between November and February.  Last dose was on 05/14/2019.  He will return again in 2 weeks time for his next dose of EPO and hopefully his blood pressure will be better at that time.  Every time he goes without EPO for a while he gets significantly anemic and needs blood transfusions from time to time.  Uncontrolled hypertension: He did have an appointment with his PCP today and also has a nephrology appointment later today.  He has chronic resistant hypertension which has not been well controlled so far   Visit Diagnosis 1. Iron deficiency anemia, unspecified iron deficiency anemia type   2. MGUS (monoclonal gammopathy of unknown significance)      Dr. Randa Evens, MD, MPH [facility] [phone] 05/25/2019 5:04 PM

## 2019-05-25 NOTE — Progress Notes (Signed)
Pt still with itching and he has it on his back, shoulder, legs, butt, arms- he has been taking 2 benadryl twice a day. Today he used vaseline on part of him. The itching comes and goes. He feels like he has lost wt.

## 2019-05-26 ENCOUNTER — Other Ambulatory Visit: Payer: Medicare Other

## 2019-05-26 ENCOUNTER — Ambulatory Visit: Payer: Medicare Other | Admitting: Oncology

## 2019-05-26 ENCOUNTER — Ambulatory Visit: Payer: Medicare Other

## 2019-06-02 ENCOUNTER — Ambulatory Visit: Payer: Medicare Other | Admitting: Podiatry

## 2019-06-03 ENCOUNTER — Other Ambulatory Visit: Payer: Self-pay

## 2019-06-03 ENCOUNTER — Ambulatory Visit
Admission: RE | Admit: 2019-06-03 | Discharge: 2019-06-03 | Disposition: A | Discharge status: Home / Self Care | Payer: Medicare Other | Source: Ambulatory Visit | Attending: Oncology | Admitting: Oncology

## 2019-06-03 DIAGNOSIS — R590 Localized enlarged lymph nodes: Secondary | ICD-10-CM | POA: Insufficient documentation

## 2019-06-03 DIAGNOSIS — R161 Splenomegaly, not elsewhere classified: Secondary | ICD-10-CM | POA: Diagnosis not present

## 2019-06-03 DIAGNOSIS — N289 Disorder of kidney and ureter, unspecified: Secondary | ICD-10-CM | POA: Diagnosis not present

## 2019-06-03 DIAGNOSIS — D472 Monoclonal gammopathy: Secondary | ICD-10-CM | POA: Diagnosis present

## 2019-06-03 DIAGNOSIS — R188 Other ascites: Secondary | ICD-10-CM | POA: Diagnosis not present

## 2019-06-03 DIAGNOSIS — I700 Atherosclerosis of aorta: Secondary | ICD-10-CM | POA: Insufficient documentation

## 2019-06-03 IMAGING — US US RENAL
1 series · 14 of 25 positions shown · non-contrast
Comparison: CT 05/30/2016

CLINICAL DATA: Chronic renal disease.

EXAM:
RENAL / URINARY TRACT ULTRASOUND COMPLETE

[Series 1: us renal · 0.22mm/px · 14 of 25 slices shown]
[im 1/25]
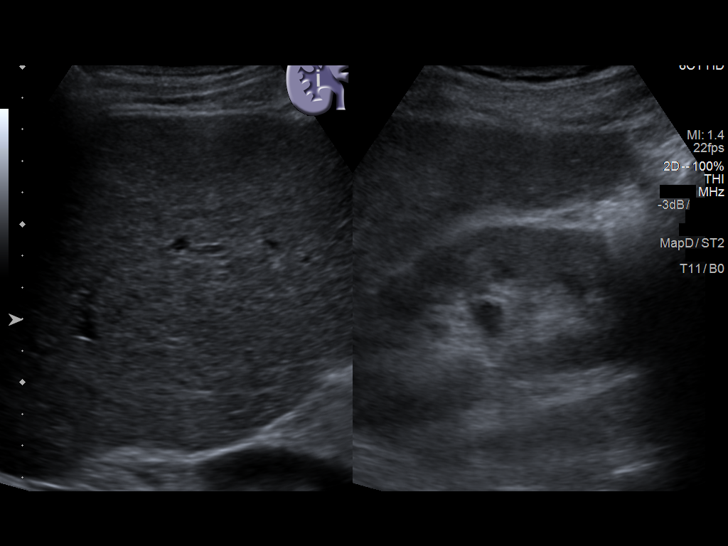
[im 3/25]
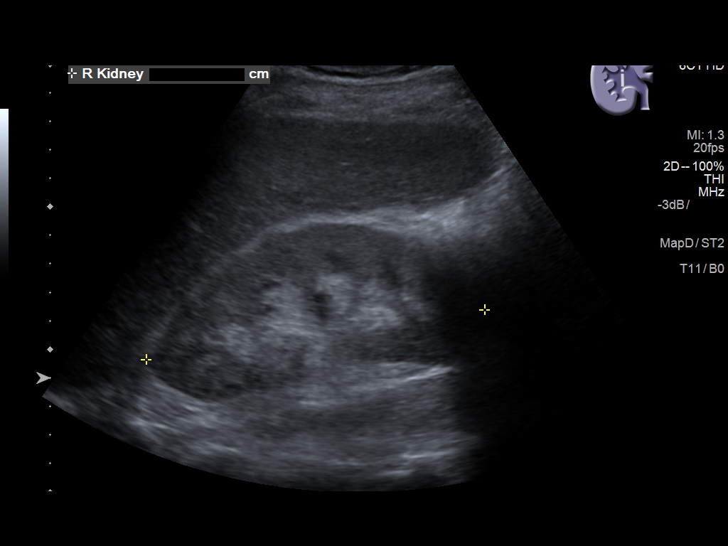
[im 5/25]
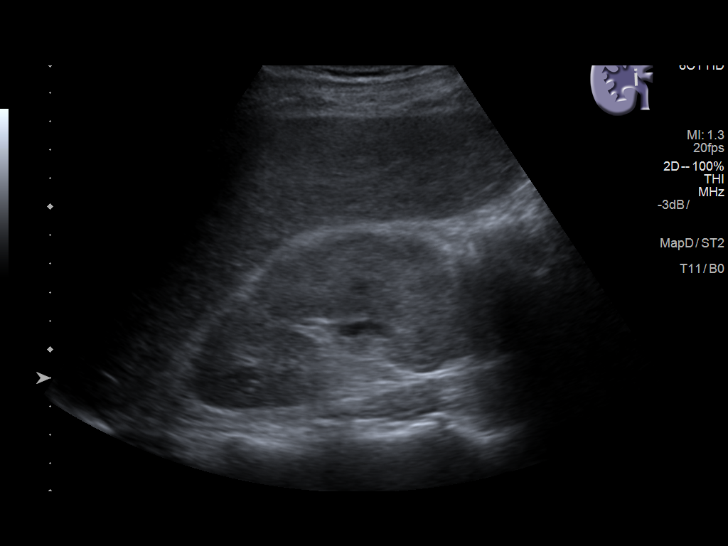
[im 7/25]
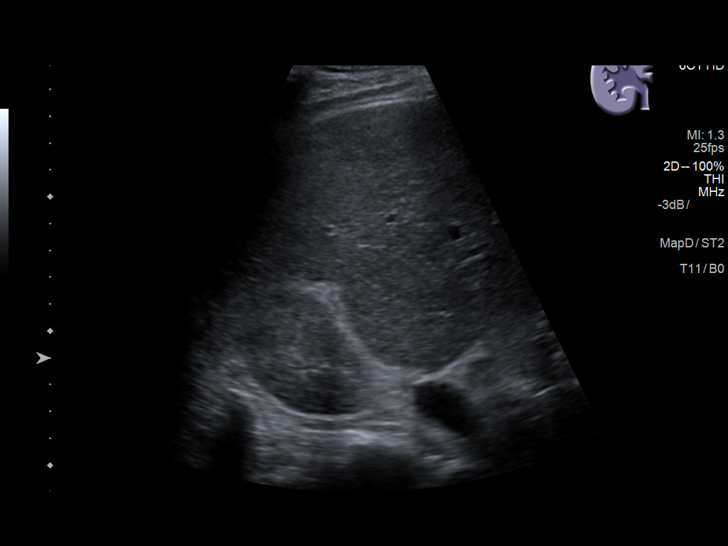
[im 9/25]
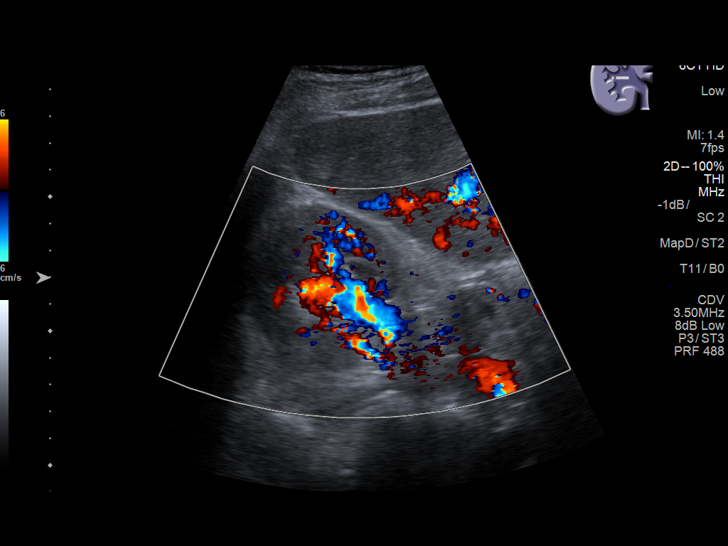
[im 10/25]
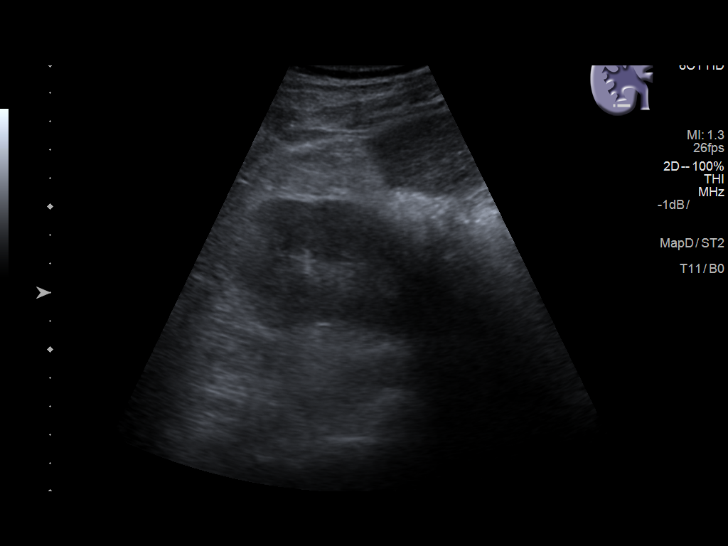
[im 12/25]
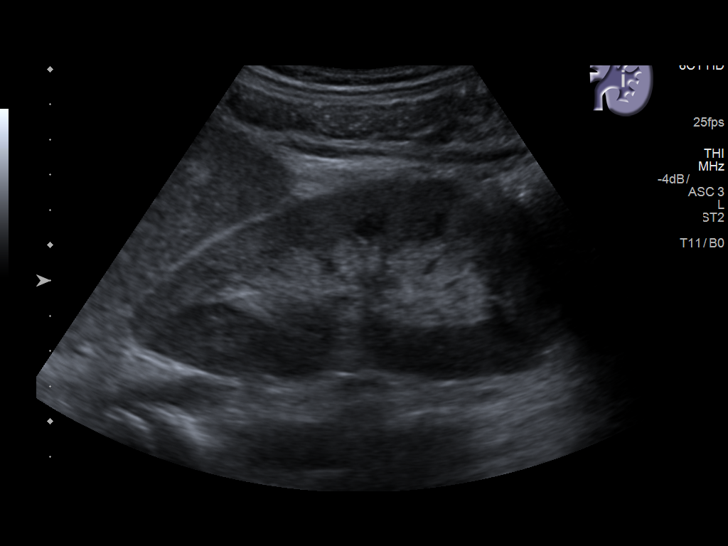
[im 14/25]
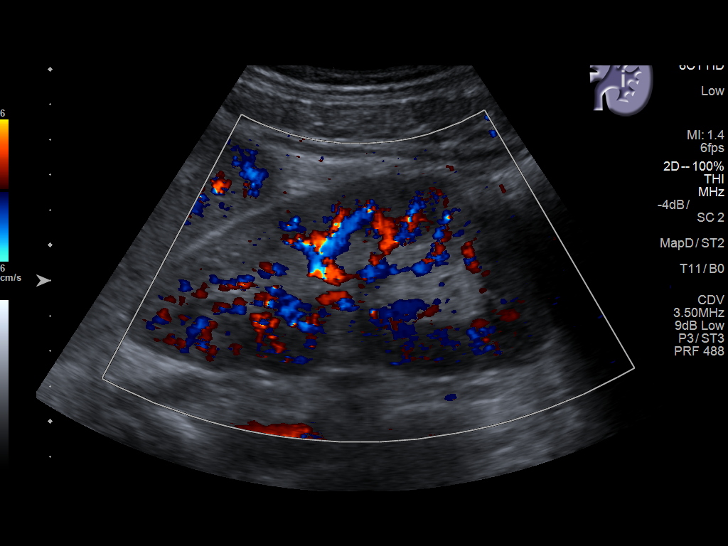
[im 16/25]
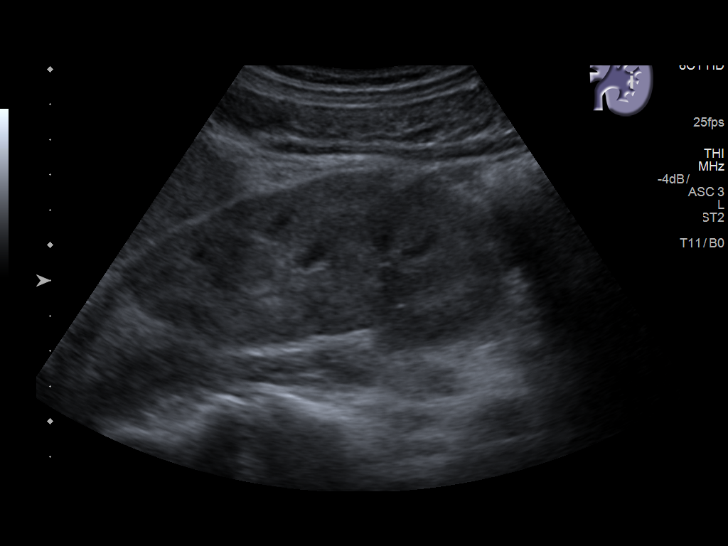
[im 17/25]
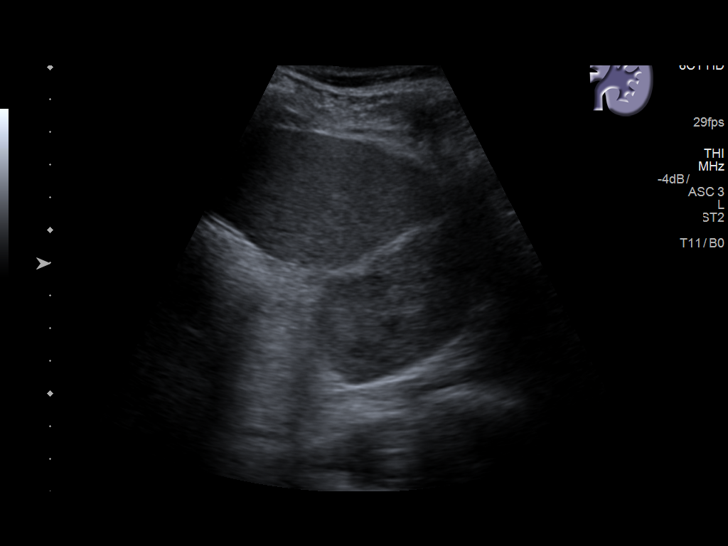
[im 19/25]
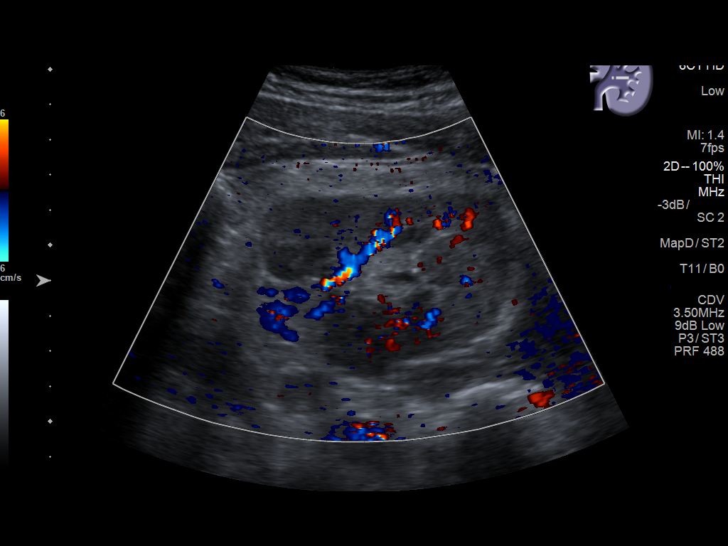
[im 21/25]
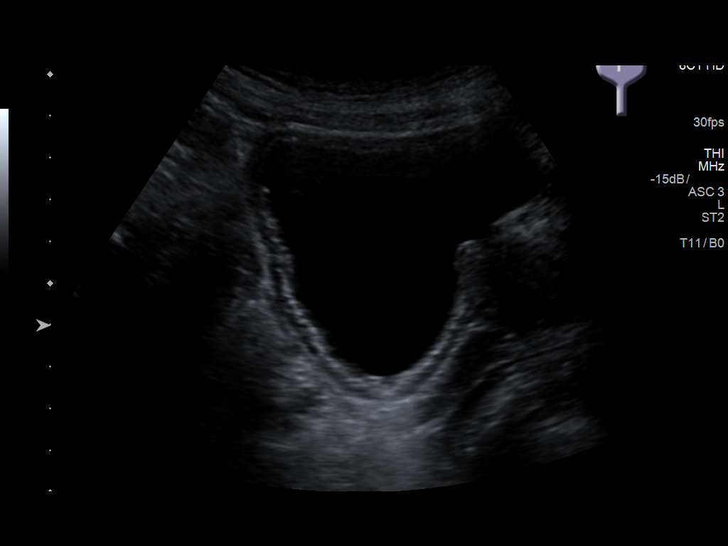
[im 23/25]
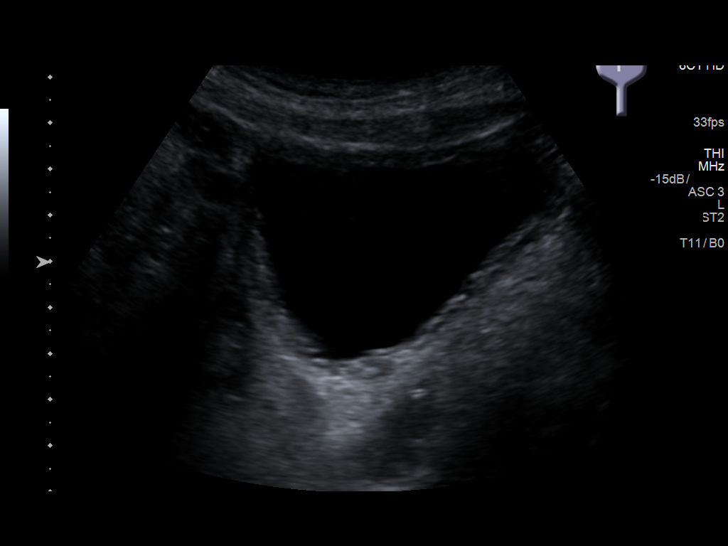
[im 25/25]
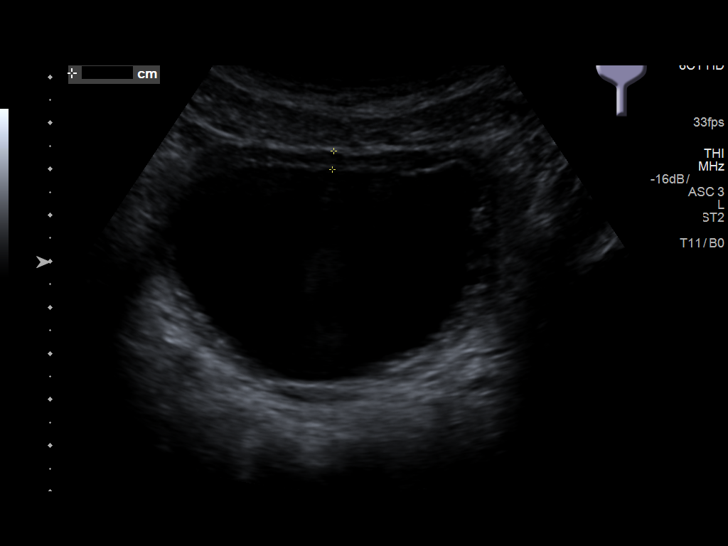

[14 of 25 positions shown; findings below may reference images not displayed]

FINDINGS: Right Kidney:

Length: 12.0 cm. increased renal echogenicity with normal cortical
thickness.

Left Kidney:

Length: 12.4 cm.. Increased renal echogenicity with normal cortical
thickness.

Bladder:

Thickened and irregular bladder wall.
IMPRESSION: 1.  No hydronephrosis.
2. Increased renal echogenicity, suggesting medical renal disease.
3. Bladder wall thickening may represent cystitis or a component of
outlet obstruction.

## 2019-06-04 ENCOUNTER — Encounter: Payer: Self-pay | Admitting: Oncology

## 2019-06-04 ENCOUNTER — Inpatient Hospital Stay: Payer: Medicare Other

## 2019-06-04 ENCOUNTER — Inpatient Hospital Stay (HOSPITAL_BASED_OUTPATIENT_CLINIC_OR_DEPARTMENT_OTHER): Payer: Medicare Other | Admitting: Oncology

## 2019-06-04 ENCOUNTER — Other Ambulatory Visit: Payer: Self-pay

## 2019-06-04 VITALS — BP 211/116 | HR 91 | Temp 97.6°F | Wt 178.0 lb

## 2019-06-04 DIAGNOSIS — D631 Anemia in chronic kidney disease: Secondary | ICD-10-CM

## 2019-06-04 DIAGNOSIS — D649 Anemia, unspecified: Secondary | ICD-10-CM

## 2019-06-04 DIAGNOSIS — I130 Hypertensive heart and chronic kidney disease with heart failure and stage 1 through stage 4 chronic kidney disease, or unspecified chronic kidney disease: Secondary | ICD-10-CM | POA: Diagnosis not present

## 2019-06-04 DIAGNOSIS — N189 Chronic kidney disease, unspecified: Secondary | ICD-10-CM

## 2019-06-04 DIAGNOSIS — D472 Monoclonal gammopathy: Secondary | ICD-10-CM

## 2019-06-04 DIAGNOSIS — I10 Essential (primary) hypertension: Secondary | ICD-10-CM | POA: Diagnosis not present

## 2019-06-04 DIAGNOSIS — Z8719 Personal history of other diseases of the digestive system: Secondary | ICD-10-CM

## 2019-06-04 LAB — COMPREHENSIVE METABOLIC PANEL
ALT: 46 U/L — ABNORMAL HIGH (ref 0–44)
AST: 75 U/L — ABNORMAL HIGH (ref 15–41)
Albumin: 2.4 g/dL — ABNORMAL LOW (ref 3.5–5.0)
Alkaline Phosphatase: 155 U/L — ABNORMAL HIGH (ref 38–126)
Anion gap: 9 (ref 5–15)
BUN: 69 mg/dL — ABNORMAL HIGH (ref 8–23)
CO2: 20 mmol/L — ABNORMAL LOW (ref 22–32)
Calcium: 7.5 mg/dL — ABNORMAL LOW (ref 8.9–10.3)
Chloride: 109 mmol/L (ref 98–111)
Creatinine, Ser: 4.49 mg/dL — ABNORMAL HIGH (ref 0.61–1.24)
GFR calc Af Amer: 15 mL/min — ABNORMAL LOW (ref >60)
GFR calc non Af Amer: 13 mL/min — ABNORMAL LOW (ref >60)
Glucose, Bld: 106 mg/dL — ABNORMAL HIGH (ref 70–99)
Potassium: 4.6 mmol/L (ref 3.5–5.1)
Sodium: 138 mmol/L (ref 135–145)
Total Bilirubin: 0.6 mg/dL (ref 0.3–1.2)
Total Protein: 6.6 g/dL (ref 6.5–8.1)

## 2019-06-04 LAB — CBC WITH DIFFERENTIAL/PLATELET
Abs Immature Granulocytes: 0.07 10*3/uL (ref 0.00–0.07)
Basophils Absolute: 0.1 10*3/uL (ref 0.0–0.1)
Basophils Relative: 1 %
Eosinophils Absolute: 0.1 10*3/uL (ref 0.0–0.5)
Eosinophils Relative: 2 %
HCT: 28 % — ABNORMAL LOW (ref 39.0–52.0)
Hemoglobin: 8.6 g/dL — ABNORMAL LOW (ref 13.0–17.0)
Immature Granulocytes: 1 %
Lymphocytes Relative: 23 %
Lymphs Abs: 1.3 10*3/uL (ref 0.7–4.0)
MCH: 29.3 pg (ref 26.0–34.0)
MCHC: 30.7 g/dL (ref 30.0–36.0)
MCV: 95.2 fL (ref 80.0–100.0)
Monocytes Absolute: 0.7 10*3/uL (ref 0.1–1.0)
Monocytes Relative: 13 %
Neutro Abs: 3.4 10*3/uL (ref 1.7–7.7)
Neutrophils Relative %: 60 %
Platelets: 154 10*3/uL (ref 150–400)
RBC: 2.94 MIL/uL — ABNORMAL LOW (ref 4.22–5.81)
RDW: 15 % (ref 11.5–15.5)
WBC: 5.7 10*3/uL (ref 4.0–10.5)
nRBC: 0 % (ref 0.0–0.2)

## 2019-06-04 LAB — SAMPLE TO BLOOD BANK

## 2019-06-04 NOTE — Progress Notes (Signed)
Patient stated that he continues to have high blood pressure and was given a new medication. Patient would like his CT Scan results.

## 2019-06-05 LAB — AFP TUMOR MARKER: AFP, Serum, Tumor Marker: 6.4 ng/mL (ref 0.0–8.3)

## 2019-06-06 NOTE — Progress Notes (Signed)
Hematology/Oncology Consult note [facility]  [phone] [phone]  Patient Care Team: [name], MD as PCP - General (Internal Medicine) Sindy Guadeloupe, MD as Consulting Physician (Hematology and Oncology)   Name of the patient: [name]  [id number]  26-Feb-1951   Date of visit: 06/06/19  Diagnosis- 1.Anemia- multifactorial- iron deficiency, chronic disease and anemia of kidney disease  2. 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 CBaruch Gouty Patient had CT abdomen in July 2016 which showed retroperitoneal, celiac and gastric adenopathy. MR pelvis in Oct 2017 showed b/l inguinal and external iliac adenopathy as well. Inguinal LN biopsy was negative for malignancy.   2. Biopsy of the skin lesion showed: DIAGNOSIS:  A. PERIANAL POLYP, LEFT; EXCISION:  - SKIN WITH POLYPOID VASCULAR PROLIFERATION WITH SUPERFICIAL NECROSIS,  SEE COMMENT.   B. PERIANAL MASS, RIGHT; INCISIONAL BIOPSY:  - INVASIVE CARCINOMA WITH BASALOID FEATURES.  - SEE COMMENT.   Comment #1:  The differential diagnosis for the perianal polyp includes pyogenic  granuloma and ulcerated hemangioma.   Comment #2:  Immunohistochemical stains were performed. The carcinoma is positive for  p16, BerEp4, and BCL-2. Stain controls worked appropriately.  Unfortunately the pattern of staining does not assist with further  classification of the carcinoma. Clinical correlation is required to  determine if the lesion involves perianal skin primarily (BCC) or is  arising in the anal canal (squamous cell carcinoma with basaloid  features).   This was followed by RT to the lesion.  3. He had repeat CT  abdomen in feb 2018 which showed: IMPRESSION: Progressive hepatic cirrhosis since prior study. No evidence of hepatic neoplasm.  New moderate ascites, diffuse mesenteric and body wall edema, and small bilateral pleural effusions. Mild diffuse small bowel wall thickening, consistent with hypoalbuminemia.  Findings consistent with portal venous hypertension, including mild splenomegaly and upper abdominal venous collaterals.  Stable mild abdominal lymphadenopathy. Mild increase in bilateral iliac and inguinal lymphadenopathy in the pelvis, which may be reactive in etiology, with metastatic disease considered less Likely.  4. Heunderwentegd and colonoscopy for his microcytic anemiawhich showed no overt bleeding lesions or malignancy  5. CBC on 09/18/2016 showed H&H of 6.5/19.8 and MCV of 86.7. Labs suggestive of iron deficiency anemia. B12 and folate was within normal limits. Multiple myeloma panel showed IGG monoclonal protein of 0.9 gm. Polyclonal gammopathyHaptoglobin and no copper was normal. Reticulocyte count was mildly elevated at 7.9. Patient received 2 doses of ferriheme on 620 and 10/03/2016. He also received 1 unit of blood transfusion  6. EGD showed a normal duodenum and normal esophagus. Found to have gastritis which was biopsied. Also found to have a large polypoid mass in the left piriform sinus and nonobstructing the airway. This was subsequently evaluated by ENT which did not find any evidence of mass. The colonoscopy showed appendixthat was appearing protruding and ordered this blood with stool. No mucus seen in the vicinity. He was evaluated by a [facility] and has been considered for a repeat colonoscopy versus endoscopy mucosal resection of the mucocele  7.Bonemarrowbiopsy showed hypercellular marrow for age. 9% plasma cells. Clonality of cells could not be evaluated because of lack of clot sections  available for staining. Cytogenetics were normal. FISH  studies were negative for any chromosomal abnormalities as well.  8. Patient received procrit for anemia of chronic kidney disease with improvement of his anemia to 10  Interval history- denies any headaches/ chest pain or syncope. Feels well.   ECOG PS- 1 Pain scale- 0   Review of systems- Review of Systems  Constitutional: Negative for chills, fever, malaise/fatigue and weight loss.  HENT: Negative for congestion, ear discharge and nosebleeds.   Eyes: Negative for blurred vision.  Respiratory: Negative for cough, hemoptysis, sputum production, shortness of breath and wheezing.   Cardiovascular: Negative for chest pain, palpitations, orthopnea and claudication.  Gastrointestinal: Negative for abdominal pain, blood in stool, constipation, diarrhea, heartburn, melena, nausea and vomiting.  Genitourinary: Negative for dysuria, flank pain, frequency, hematuria and urgency.  Musculoskeletal: Negative for back pain, joint pain and myalgias.  Skin: Negative for rash.  Neurological: Negative for dizziness, tingling, focal weakness, seizures, weakness and headaches.  Endo/Heme/Allergies: Does not bruise/bleed easily.  Psychiatric/Behavioral: Negative for depression and suicidal ideas. The patient does not have insomnia.      No Known Allergies   Past Medical History:  Diagnosis Date  . Anemia   . Cancer ([place]) 11/02/2014   PERIANAL MASS, RIGHT; INCISIONAL BIOPSY: INVASIVE CARCINOMA WITH BASALOID FEATURES  . CHF (congestive heart failure) ([place])   . Chronic kidney disease   . COPD (chronic obstructive pulmonary disease) ([place])    NO INHALERS  . Diabetes mellitus    NO MEDS  . GERD (gastroesophageal reflux disease)   . Hepatic cirrhosis ([place])   . Hypertension   . Leg swelling   . Loose, teeth    PATIENT STATES "HAS 6 TEETH, SOME ARE LOOSE"  . Neuromuscular disorder ([place])    RIGHT HAND AND FINGERS NUMB  . Shortness of breath dyspnea    WITH EXERTION   . Squamous cell cancer of  skin of buttock 02/24/2016     Past Surgical History:  Procedure Laterality Date  . APPENDECTOMY    . COLONOSCOPY WITH PROPOFOL N/A 11/19/2014   Procedure: COLONOSCOPY WITH PROPOFOL;  Surgeon: Lucilla Lame, MD;  Location: [facility];  Service: Endoscopy;  Laterality: N/A;  DIABETIC-ORAL MEDS  . COLONOSCOPY WITH PROPOFOL N/A 10/04/2016   Procedure: COLONOSCOPY WITH PROPOFOL;  Surgeon: Jonathon Bellows, MD;  Location: [facility] ENDOSCOPY;  Service: Endoscopy;  Laterality: N/A;  . ESOPHAGOGASTRODUODENOSCOPY (EGD) WITH PROPOFOL N/A 10/04/2016   Procedure: ESOPHAGOGASTRODUODENOSCOPY (EGD) WITH PROPOFOL;  Surgeon: Jonathon Bellows, MD;  Location: [facility] ENDOSCOPY;  Service: Endoscopy;  Laterality: N/A;  . GIVENS CAPSULE STUDY N/A 11/13/2016   Procedure: GIVENS CAPSULE STUDY;  Surgeon: Jonathon Bellows, MD;  Location: [facility] ENDOSCOPY;  Service: Gastroenterology;  Laterality: N/A;  . INCISION AND DRAINAGE PERIRECTAL ABSCESS Right 12/16/2017   Procedure: EXCISION BASAL CELL CANCER/ GLUTEAL ABSCESS AND FLAP COVERAGE;  Surgeon: Robert Bellow, MD;  Location: [facility] ORS;  Service: General;  Laterality: Right;  . INGUINAL LYMPH NODE BIOPSY Right 01/26/2016   Procedure: INGUINAL LYMPH NODE BIOPSY;  Surgeon: Clayburn Pert, MD;  Location: [facility] ORS;  Service: General;  Laterality: Right;  . RECTAL BIOPSY N/A 11/02/2014   Procedure: BIOPSY RECTAL;  Surgeon: Marlyce Huge, MD;  Location: [facility] ORS;  Service: General;  Laterality: N/A;  . RECTAL EXAM UNDER ANESTHESIA N/A 11/02/2014   Procedure: RECTAL EXAM UNDER ANESTHESIA;  Surgeon: Marlyce Huge, MD;  Location: [facility] ORS;  Service: General;  Laterality: N/A;  . TONSILLECTOMY      Social History  Socioeconomic History  . Marital status: Divorced    Spouse name: Not on file  . Number of children: Not on file  . Years of education: Not on file  . Highest education level: Not on file  Occupational History  . Not on file  Tobacco Use  . Smoking status:  Former Smoker    Packs/day: 0.25    Years: 20.00    Pack years: 5.00    Types: Cigarettes  . Smokeless tobacco: Never Used  . Tobacco comment: 3-4 cigarettes per day  Substance and Sexual Activity  . Alcohol use: Not Currently  . Drug use: Not Currently    Comment: Last Use 2016 per patient-+ UDS FOR COCAINE IN 2014  . Sexual activity: Never  Other Topics Concern  . Not on file  Social History Narrative  . Not on file   Social Determinants of Health   Financial Resource Strain:   . Difficulty of Paying Living Expenses: Not on file  Food Insecurity:   . Worried About Charity fundraiser in the Last Year: Not on file  . Ran Out of Food in the Last Year: Not on file  Transportation Needs:   . Lack of Transportation (Medical): Not on file  . Lack of Transportation (Non-Medical): Not on file  Physical Activity:   . Days of Exercise per Week: Not on file  . Minutes of Exercise per Session: Not on file  Stress:   . Feeling of Stress : Not on file  Social Connections:   . Frequency of Communication with Friends and Family: Not on file  . Frequency of Social Gatherings with Friends and Family: Not on file  . Attends Religious Services: Not on file  . Active Member of Clubs or Organizations: Not on file  . Attends Archivist Meetings: Not on file  . Marital Status: Not on file  Intimate Partner Violence:   . Fear of Current or Ex-Partner: Not on file  . Emotionally Abused: Not on file  . Physically Abused: Not on file  . Sexually Abused: Not on file    Family History  Problem Relation Age of Onset  . Asthma Mother   . Colon cancer Neg Hx      Current Outpatient Medications:  .  albuterol (VENTOLIN HFA) 108 (90 Base) MCG/ACT inhaler, Inhale 2 puffs into the lungs every 6 (six) hours as needed for wheezing or shortness of breath., Disp: , Rfl:  .  allopurinol (ZYLOPRIM) 100 MG tablet, Take 100 mg by mouth daily., Disp: , Rfl:  .  apixaban (ELIQUIS) 5 MG TABS  tablet, Take 1 tablet (5 mg total) by mouth 2 (two) times daily., Disp: 60 tablet, Rfl: 1 .  betamethasone dipropionate 0.05 % cream, Apply topically 2 (two) times daily., Disp: 45 g, Rfl: 3 .  diltiazem (CARDIZEM CD) 180 MG 24 hr capsule, Take 1 capsule (180 mg total) by mouth daily., Disp: 30 capsule, Rfl: 1 .  DULERA 200-5 MCG/ACT AERO, Inhale 1 puff into the lungs 2 (two) times daily., Disp: , Rfl:  .  ferrous sulfate 325 (65 FE) MG tablet, Take 325 mg by mouth daily. , Disp: , Rfl:  .  hydrALAZINE (APRESOLINE) 100 MG tablet, Take 0.5 tablets (50 mg total) by mouth 3 (three) times daily., Disp: 30 tablet, Rfl: 0 .  losartan (COZAAR) 50 MG tablet, Take 1 tablet by mouth daily., Disp: , Rfl:  .  omeprazole (PRILOSEC) 40 MG capsule, Take 40 mg by mouth  every morning. , Disp: , Rfl:  .  polyethylene glycol (MIRALAX / GLYCOLAX) packet, Take 17 g by mouth daily as needed for mild constipation. , Disp: , Rfl:  .  predniSONE (STERAPRED UNI-PAK 21 TAB) 10 MG (21) TBPK tablet, 50 mg X2 days, 40 mg X2 days, 30 mg X2 days, 20 mg X2 days 10 mg X2 days then stop, Disp: 30 tablet, Rfl: 0 .  sodium bicarbonate 650 MG tablet, Take 1 tablet (650 mg total) by mouth 3 (three) times daily., Disp: 20 tablet, Rfl: 0 .  SPIRIVA RESPIMAT 1.25 MCG/ACT AERS, Inhale 1 spray into the lungs daily., Disp: , Rfl:  .  torsemide (DEMADEX) 10 MG tablet, Take 10 mg by mouth daily. , Disp: , Rfl:  .  vitamin B-12 (CYANOCOBALAMIN) 1000 MCG tablet, Take 1 tablet (1,000 mcg total) by mouth daily., Disp: 30 tablet, Rfl: 3 No current facility-administered medications for this visit.  Facility-Administered Medications Ordered in Other Visits:  .  epoetin alfa-epbx (RETACRIT) injection 10,000 Units, 10,000 Units, Subcutaneous, Once **AND** [DISCONTINUED] epoetin alfa-epbx (RETACRIT) injection 40,000 Units, 40,000 Units, Subcutaneous, Once, Sindy Guadeloupe, MD  Physical exam:  Vitals:   06/04/19 1005  BP: (!) 211/116  Pulse: 91    Temp: 97.6 F (36.4 C)  TempSrc: Tympanic  SpO2: 100%  Weight: 178 lb (80.7 kg)   Physical Exam HENT:     Head: Normocephalic and atraumatic.  Eyes:     Pupils: Pupils are equal, round, and reactive to light.  Cardiovascular:     Rate and Rhythm: Normal rate and regular rhythm.     Heart sounds: Normal heart sounds.  Pulmonary:     Effort: Pulmonary effort is normal.     Breath sounds: Normal breath sounds.  Abdominal:     General: Bowel sounds are normal.     Palpations: Abdomen is soft.  Musculoskeletal:     Cervical back: Normal range of motion.  Skin:    General: Skin is warm and dry.  Neurological:     Mental Status: He is alert and oriented to person, place, and time.      CMP Latest Ref Rng & Units 06/04/2019  Glucose 70 - 99 mg/dL 106(H)  BUN 8 - 23 mg/dL 69(H)  Creatinine 0.61 - 1.24 mg/dL 4.49(H)  Sodium 135 - 145 mmol/L 138  Potassium 3.5 - 5.1 mmol/L 4.6  Chloride 98 - 111 mmol/L 109  CO2 22 - 32 mmol/L 20(L)  Calcium 8.9 - 10.3 mg/dL 7.5(L)  Total Protein 6.5 - 8.1 g/dL 6.6  Total Bilirubin 0.3 - 1.2 mg/dL 0.6  Alkaline Phos 38 - 126 U/L 155(H)  AST 15 - 41 U/L 75(H)  ALT 0 - 44 U/L 46(H)   CBC Latest Ref Rng & Units 06/04/2019  WBC 4.0 - 10.5 K/uL 5.7  Hemoglobin 13.0 - 17.0 g/dL 8.6(L)  Hematocrit 39.0 - 52.0 % 28.0(L)  Platelets 150 - 400 K/uL 154    No images are attached to the encounter.  CT Abdomen Pelvis Wo Contrast  Result Date: 06/03/2019 CLINICAL DATA:  Intermittent in loss of weight and abdominal bloating for 2 months. Anemia with chronic kidney disease. IgA MGUS EXAM: CT ABDOMEN AND PELVIS WITHOUT CONTRAST TECHNIQUE: Multidetector CT imaging of the abdomen and pelvis was performed following the standard protocol without IV contrast. COMPARISON:  02/03/2017 FINDINGS: Lower chest: Scarring identified within the lateral left lower lobe, new from previous exam. Hepatobiliary: There is hypertrophy of the caudate lobe of liver. The  contour  the liver is irregular. Within the limitations of un-enhanced technique no focal liver abnormality identified. Mild perihepatic ascites.Collapsed gallbladder. Pancreas: Unremarkable. No pancreatic ductal dilatation or surrounding inflammatory changes. Spleen: Spleen measures 13.2 by 13.2 by 6.1 cm (volume = 560 cm^3) Adrenals/Urinary Tract: Normal appearance of the adrenal glands. Exophytic lesion arising from inferior pole of right kidney measures 1.9 cm and 65 Hounsfield units. This is incompletely characterized without IV contrast but appears increased from previous exam when it measured 1 cm. No hydronephrosis identified bilaterally. Urinary bladder appears unremarkable. Stomach/Bowel: Stomach is within normal limits. Appendix appears normal. No evidence of bowel wall thickening, distention, or inflammatory changes. Vascular/Lymphatic: Aortic atherosclerosis. Multiple prominent upper abdominal lymph nodes are again noted: -gastrohepatic ligament node measures 1.6 cm, image 23/2. Previously 1.7 cm. -aortocaval lymph node measures 1.5 cm, image 35/2. Unchanged from previous exam. -Right common iliac node measures 1.1 cm, image 56/2. Previously 0.8 cm. -Left external iliac node measures 1.2 cm, image 72/2. Previously 1.1 cm. -Right external iliac node measures 1.3 cm, image 73/2. Previously this measured the same. -Prominent bilateral inguinal lymph nodes are also unchanged including 1.6 cm left inguinal lymph node, image 88/2. Reproductive: Prostate is unremarkable. Other: Perihepatic and perisplenic ascites is noted. Trace free fluid noted within the pelvis. Musculoskeletal: The small lucent lesion within the right iliac wing measures 6 mm, image 63/2. Previously this measured the same. The IMPRESSION: 1. Morphologic features of the liver compatible with cirrhosis. There is stigmata of portal venous hypertension including splenomegaly and ascites. 2. There is an indeterminate exophytic lesion arising from the  inferior pole of right kidney which has increased in size from previous exam. This is incompletely characterized without IV contrast. 3. Mild abdominal, pelvic and inguinal adenopathy is again identified. Not significantly changed when compared with 02/04/2017. In the setting of cirrhosis this may be a nonspecific finding. Aortic Atherosclerosis (ICD10-I70.0). Electronically Signed   By: Kerby Moors M.D.   On: 06/03/2019 12:36     Assessment and plan- Patient is a 69 y.o. male with anemia of chronic kidney disease here for routine f/u  Patient was complaining of ongoing weight loss and given his prior history of alcohol-related cirrhosis I did obtain a CT abdomenWhich has not revealed any evidence of malignancy.  Mild splenomegaly of 13 cm.  He does have a 1.9 cm exophytic lesion in his right kidney which has not changed significantly since 2008.  Nonspecific intra-abdominal adenopathy also stable over the last 3 years.  Also AFP is normal.  With regards to his anemia: Patient was responding well to epo and was able to maintain his hemoglobin between 9-10.  However in the last 3 months his blood pressure has remained uncontrolled mostly between 446X to 507K systolic or perhaps even higher.  He sees both Dr. Humphrey Rolls as well as nephrology but his hypertension remains uncontrolled and therefore we are unable to give him EPO with such uncontrolled blood pressure.  Patient therefore goes for.  Is without EPO evidence of any drops in hemoglobin requiring blood transfusions from time to time.  He is unable to get EPO today.  CBC in 3 weeks in 6 weeks for possibly EPO or Retacrit based on his blood pressure   Visit Diagnosis 1. Uncontrolled hypertension   2. Anemia of chronic renal failure, unspecified CKD stage      Dr. Randa Evens, MD, MPH [facility] [phone] 06/06/2019 1:25 PM

## 2019-06-10 ENCOUNTER — Other Ambulatory Visit (INDEPENDENT_AMBULATORY_CARE_PROVIDER_SITE_OTHER): Payer: Self-pay | Admitting: Nurse Practitioner

## 2019-06-10 DIAGNOSIS — N184 Chronic kidney disease, stage 4 (severe): Secondary | ICD-10-CM

## 2019-06-11 ENCOUNTER — Ambulatory Visit (INDEPENDENT_AMBULATORY_CARE_PROVIDER_SITE_OTHER): Payer: Medicare Other | Admitting: Nurse Practitioner

## 2019-06-11 ENCOUNTER — Other Ambulatory Visit (INDEPENDENT_AMBULATORY_CARE_PROVIDER_SITE_OTHER): Payer: Medicare Other

## 2019-06-11 ENCOUNTER — Encounter (INDEPENDENT_AMBULATORY_CARE_PROVIDER_SITE_OTHER): Payer: Medicare Other

## 2019-06-16 ENCOUNTER — Encounter: Payer: Self-pay | Admitting: Podiatry

## 2019-06-16 ENCOUNTER — Ambulatory Visit (INDEPENDENT_AMBULATORY_CARE_PROVIDER_SITE_OTHER): Payer: Medicare Other | Admitting: Podiatry

## 2019-06-16 ENCOUNTER — Other Ambulatory Visit: Payer: Self-pay

## 2019-06-16 VITALS — Temp 98.1°F

## 2019-06-16 DIAGNOSIS — E0843 Diabetes mellitus due to underlying condition with diabetic autonomic (poly)neuropathy: Secondary | ICD-10-CM

## 2019-06-16 DIAGNOSIS — L97512 Non-pressure chronic ulcer of other part of right foot with fat layer exposed: Secondary | ICD-10-CM | POA: Diagnosis not present

## 2019-06-16 MED ORDER — GENTAMICIN SULFATE 0.1 % EX CREA: 1.0000 "application " | TOPICAL_CREAM | Freq: Two times a day (BID) | CUTANEOUS | 1 refills | Status: AC

## 2019-06-16 MED ORDER — BETAMETHASONE DIPROPIONATE 0.05 % EX CREA: TOPICAL_CREAM | Freq: Two times a day (BID) | CUTANEOUS | 3 refills | Status: AC

## 2019-06-19 NOTE — Progress Notes (Signed)
   HPI: 69 y.o. male presenting today for follow-up evaluation regarding ulcerations of the right great toe and right 2nd toe. He states he is doing well. He reports improvement in the wounds and states the creams have been helping. He reports some intermittent numbness of the toes. He denies any pain or worsening factors. Patient is here for further evaluation and treatment.   Past Medical History:  Diagnosis Date  . Anemia   . Cancer ([place]) 11/02/2014   PERIANAL MASS, RIGHT; INCISIONAL BIOPSY: INVASIVE CARCINOMA WITH BASALOID FEATURES  . CHF (congestive heart failure) ([place])   . Chronic kidney disease   . COPD (chronic obstructive pulmonary disease) ([place])    NO INHALERS  . Diabetes mellitus    NO MEDS  . GERD (gastroesophageal reflux disease)   . Hepatic cirrhosis ([place])   . Hypertension   . Leg swelling   . Loose, teeth    PATIENT STATES "HAS 6 TEETH, SOME ARE LOOSE"  . Neuromuscular disorder ([place])    RIGHT HAND AND FINGERS NUMB  . Shortness of breath dyspnea    WITH EXERTION   . 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  Dermatology: Wound #1 noted to the right hallux measuring approximately 1.0 x 1.0 x 0.1 cm.   Wound #2 noted to the right second toe measuring approximately 1.0 x 1.0 x 0.1 cm.   To the above-noted ulceration, there is no eschar. There is a moderate amount of slough, fibrin and necrotic tissue. Granulation tissue and wound base is red. There is no malodor. There is a minimal amount of serosanginous drainage noted. Periwound integrity is intact.   Vascular: Palpable pedal pulses bilaterally.  Edema noted to the right hallux and second toe.  Neurological: Epicritic and protective threshold absent bilaterally.   Musculoskeletal Exam: Range of motion within normal limits to all pedal and ankle joints bilateral. Muscle strength 5/5 in all groups bilateral.    Assessment: 1. Ulceration right hallux secondary to diabetes mellitus  2. Ulceration right 2nd toe secondary to diabetes mellitus  2. Diabetes mellitus with peripheral polyneuropathy: Uncontrolled   Plan of Care:  1. Patient evaluated.   2. Medically necessary excisional debridement including subcutaneous tissue was performed using a tissue nipper and a chisel blade. Excisional debridement of all the necrotic nonviable tissue down to healthy bleeding viable tissue was performed with post-debridement measurements same as pre-. 3. The wound was cleansed and dry sterile dressing applied. 4. Continue the combination mixture of gentamicin cream and betamethasone 0.05% cream  5. Refill prescriptions for Gentamicin and Betamethasone creams sent to pharmacy.  6. Return to clinic in 4 weeks.      Edrick Kins, DPM [facility]  Dr. Edrick Kins, DPM    [address], [place] [address]                Office [phone]  Fax [phone]

## 2019-06-25 ENCOUNTER — Inpatient Hospital Stay: Payer: Medicare Other | Attending: Oncology

## 2019-06-25 ENCOUNTER — Inpatient Hospital Stay: Payer: Medicare Other

## 2019-06-25 VITALS — BP 152/84 | HR 82 | Resp 18

## 2019-06-25 DIAGNOSIS — Z79899 Other long term (current) drug therapy: Secondary | ICD-10-CM | POA: Diagnosis not present

## 2019-06-25 DIAGNOSIS — D649 Anemia, unspecified: Secondary | ICD-10-CM

## 2019-06-25 DIAGNOSIS — D472 Monoclonal gammopathy: Secondary | ICD-10-CM | POA: Diagnosis not present

## 2019-06-25 DIAGNOSIS — D631 Anemia in chronic kidney disease: Secondary | ICD-10-CM | POA: Insufficient documentation

## 2019-06-25 DIAGNOSIS — E1122 Type 2 diabetes mellitus with diabetic chronic kidney disease: Secondary | ICD-10-CM | POA: Diagnosis not present

## 2019-06-25 DIAGNOSIS — I130 Hypertensive heart and chronic kidney disease with heart failure and stage 1 through stage 4 chronic kidney disease, or unspecified chronic kidney disease: Secondary | ICD-10-CM | POA: Insufficient documentation

## 2019-06-25 DIAGNOSIS — D509 Iron deficiency anemia, unspecified: Secondary | ICD-10-CM | POA: Diagnosis not present

## 2019-06-25 DIAGNOSIS — N189 Chronic kidney disease, unspecified: Secondary | ICD-10-CM | POA: Diagnosis present

## 2019-06-25 DIAGNOSIS — L299 Pruritus, unspecified: Secondary | ICD-10-CM | POA: Diagnosis not present

## 2019-06-25 LAB — CBC WITH DIFFERENTIAL/PLATELET
Abs Immature Granulocytes: 0.07 10*3/uL (ref 0.00–0.07)
Basophils Absolute: 0 10*3/uL (ref 0.0–0.1)
Basophils Relative: 1 %
Eosinophils Absolute: 0.3 10*3/uL (ref 0.0–0.5)
Eosinophils Relative: 6 %
HCT: 24.5 % — ABNORMAL LOW (ref 39.0–52.0)
Hemoglobin: 8 g/dL — ABNORMAL LOW (ref 13.0–17.0)
Immature Granulocytes: 1 %
Lymphocytes Relative: 20 %
Lymphs Abs: 1.2 10*3/uL (ref 0.7–4.0)
MCH: 29.5 pg (ref 26.0–34.0)
MCHC: 32.7 g/dL (ref 30.0–36.0)
MCV: 90.4 fL (ref 80.0–100.0)
Monocytes Absolute: 0.8 10*3/uL (ref 0.1–1.0)
Monocytes Relative: 14 %
Neutro Abs: 3.5 10*3/uL (ref 1.7–7.7)
Neutrophils Relative %: 58 %
Platelets: 189 10*3/uL (ref 150–400)
RBC: 2.71 MIL/uL — ABNORMAL LOW (ref 4.22–5.81)
RDW: 14.6 % (ref 11.5–15.5)
WBC: 5.9 10*3/uL (ref 4.0–10.5)
nRBC: 0 % (ref 0.0–0.2)

## 2019-06-25 LAB — SAMPLE TO BLOOD BANK

## 2019-06-25 MED ORDER — EPOETIN ALFA-EPBX 10000 UNIT/ML IJ SOLN
10000.0000 [IU] | Freq: Once | INTRAMUSCULAR | Status: AC
  Administered 2019-06-25: 10000 [IU] via SUBCUTANEOUS
  Filled 2019-06-25: qty 1

## 2019-06-25 MED ORDER — EPOETIN ALFA-EPBX 40000 UNIT/ML IJ SOLN
40000.0000 [IU] | Freq: Once | INTRAMUSCULAR | Status: AC
  Administered 2019-06-25: 40000 [IU] via SUBCUTANEOUS
  Filled 2019-06-25: qty 1

## 2019-06-25 MED ORDER — EPOETIN ALFA-EPBX 40000 UNIT/ML IJ SOLN: 50000.0000 [IU] | Freq: Once | INTRAMUSCULAR | Status: DC

## 2019-07-09 ENCOUNTER — Telehealth: Payer: Self-pay | Admitting: Internal Medicine

## 2019-07-09 NOTE — Telephone Encounter (Signed)
Please cancel upcoming appt pt died 07-21-2019 Thank you

## 2019-07-09 DEATH — deceased

## 2019-07-16 ENCOUNTER — Ambulatory Visit: Payer: Medicare Other

## 2019-07-16 ENCOUNTER — Ambulatory Visit: Payer: Medicare Other | Admitting: Oncology

## 2019-07-16 ENCOUNTER — Other Ambulatory Visit: Payer: Medicare Other

## 2019-08-04 ENCOUNTER — Ambulatory Visit: Payer: Medicare Other | Admitting: Internal Medicine

## 2019-08-18 ENCOUNTER — Ambulatory Visit: Payer: Medicare Other | Admitting: Podiatry

## 2019-09-01 ENCOUNTER — Ambulatory Visit: Payer: Medicare Other | Admitting: Podiatry

## 2019-09-04 IMAGING — CR DG FOOT COMPLETE 3+V*R*
1 series · 3 of 3 positions shown · non-contrast
Comparison: Right foot films of 01/28/2017

CLINICAL DATA: Nonhealing wound on the heel of the right foot

EXAM:
RIGHT FOOT COMPLETE - 3+ VIEW

[Series 1: x foot ap right · 0.14mm/px · 3 of 3 slices shown]
[im 1/3]
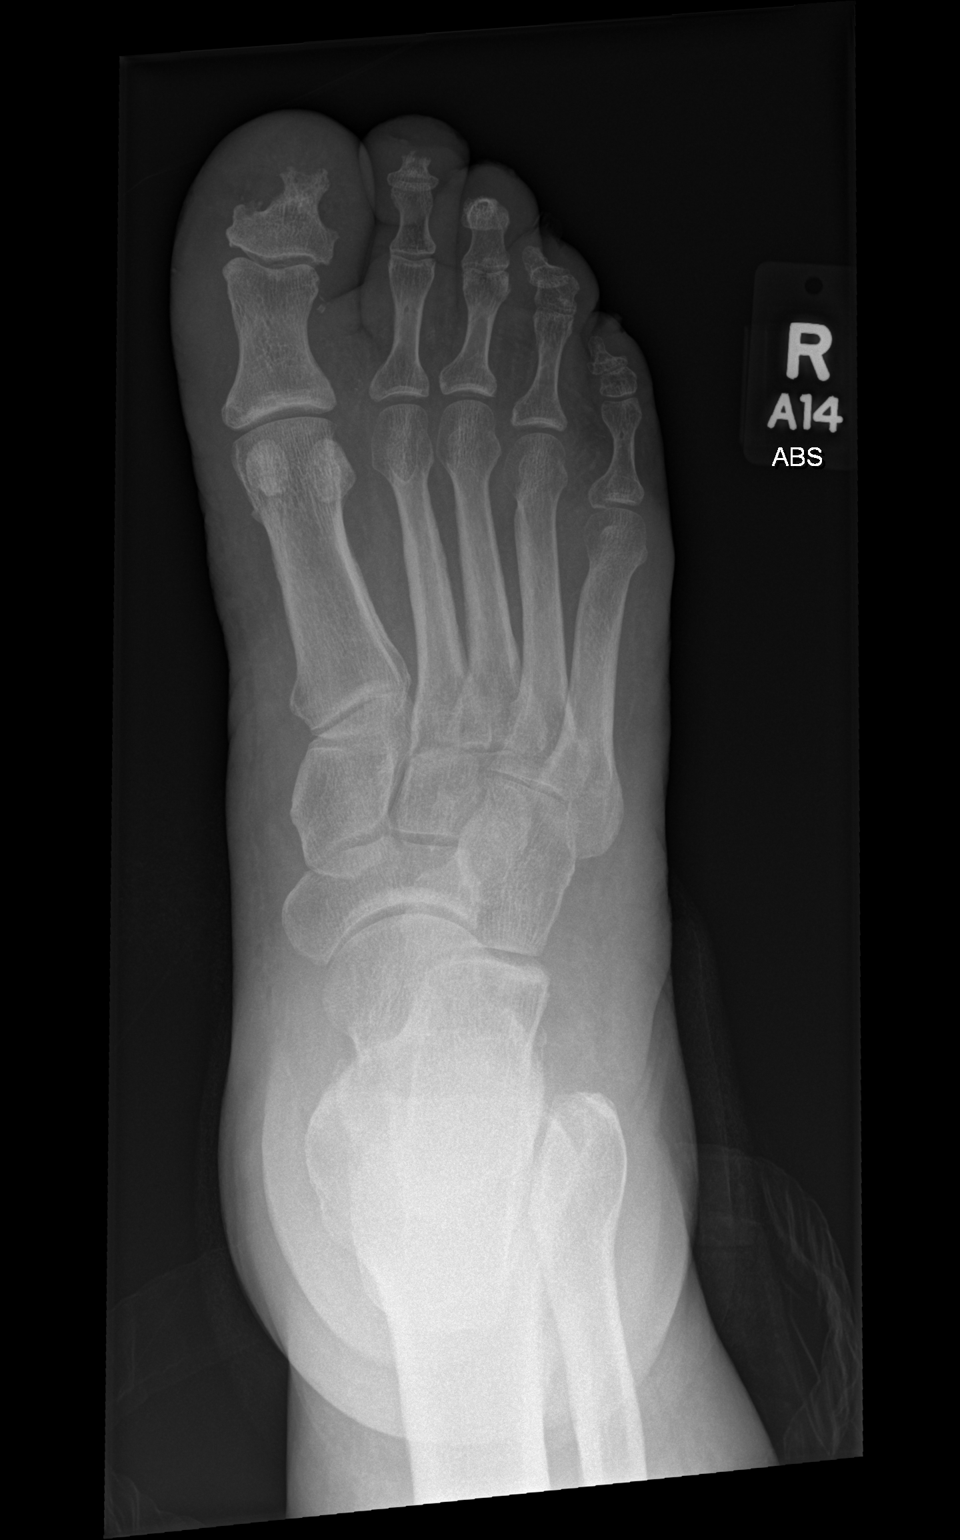
[im 2/3]
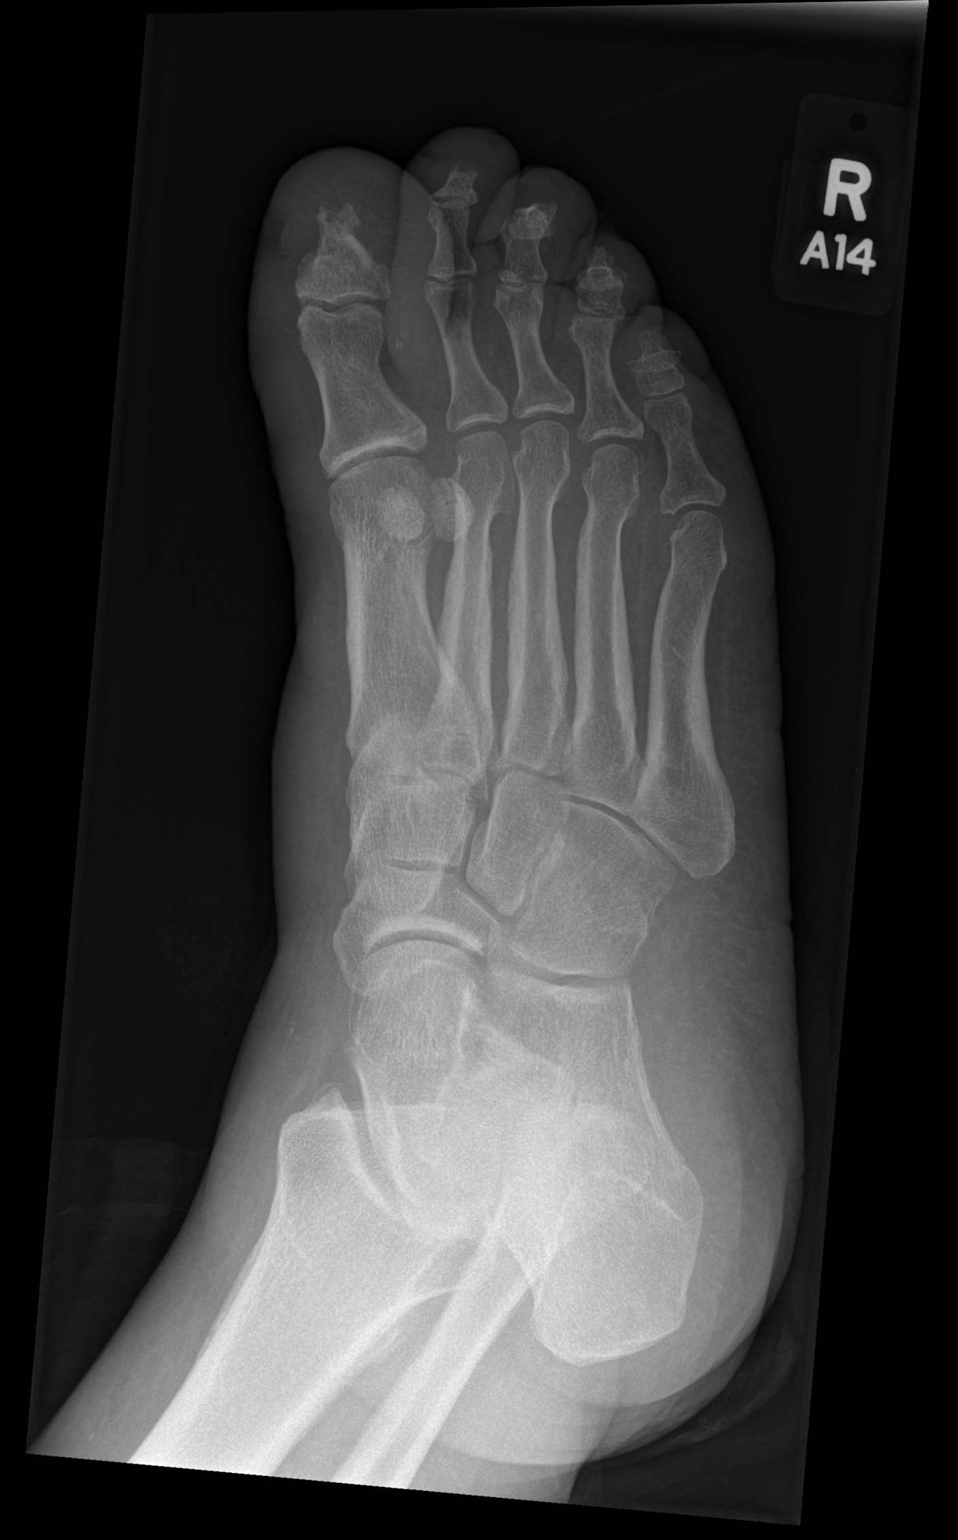
[im 3/3]
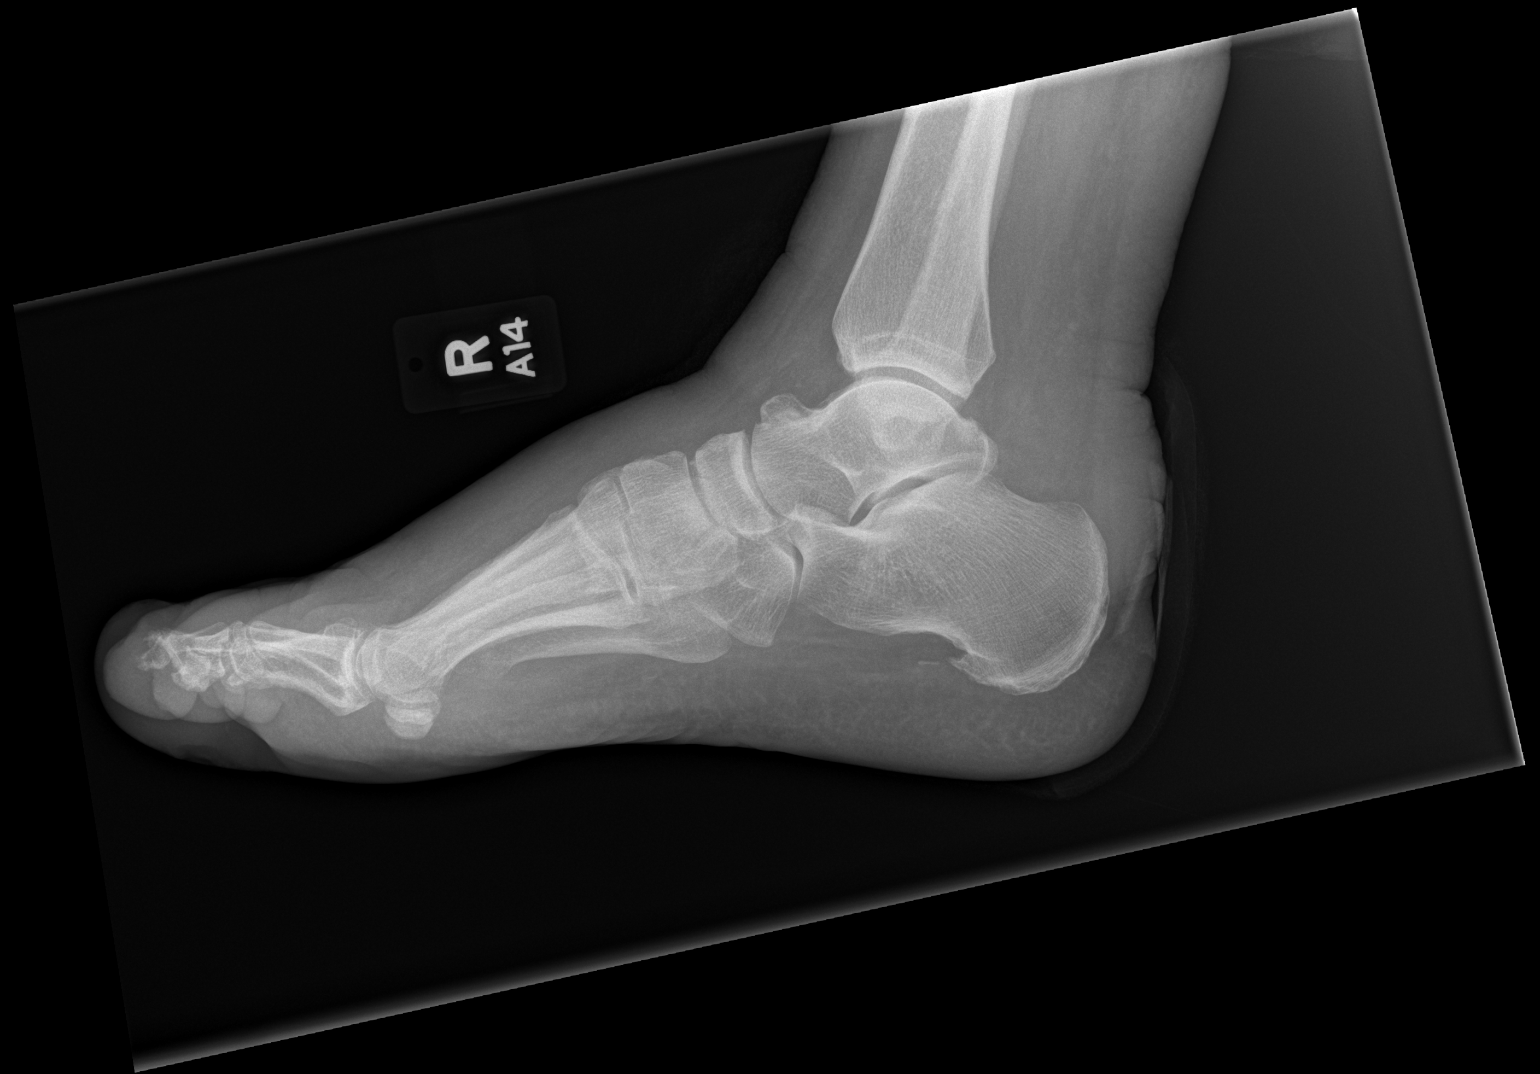

[3 of 3 positions shown; findings below may reference images not displayed]

FINDINGS: As noted on prior studies, erosion of the tuft of the distal phalanx
of the right great toe and the distal phalanx of the right second
toe is again noted. On the oblique view there also is suggestion of
some erosion of the tuft of the distal phalanx of the right third
toe. The distal phalanges of the fourth and fifth toes appear
unremarkable on the images obtained. Joint spaces appear normal. No
definite cortical irregularity or erosion of the calcaneus is seen
on the images obtained. A small plantar calcaneal degenerative spur
is present.
IMPRESSION: Impression

1. Erosion of the [REDACTED] of the distal phalanges of the right first,
second and possibly third toes consistent with osteomyelitis.
2. No definite abnormality of the calcaneus is seen on the images
obtained.
3. Plantar calcaneal degenerative spur.

## 2020-03-06 IMAGING — CR DG CHEST 2V
2 series · 2 of 2 positions shown · non-contrast
Comparison: Chest radiograph performed 01/23/2016

CLINICAL DATA: Acute onset of intermittent generalized chest pain.

EXAM:
CHEST - 2 VIEW

[chest pa]
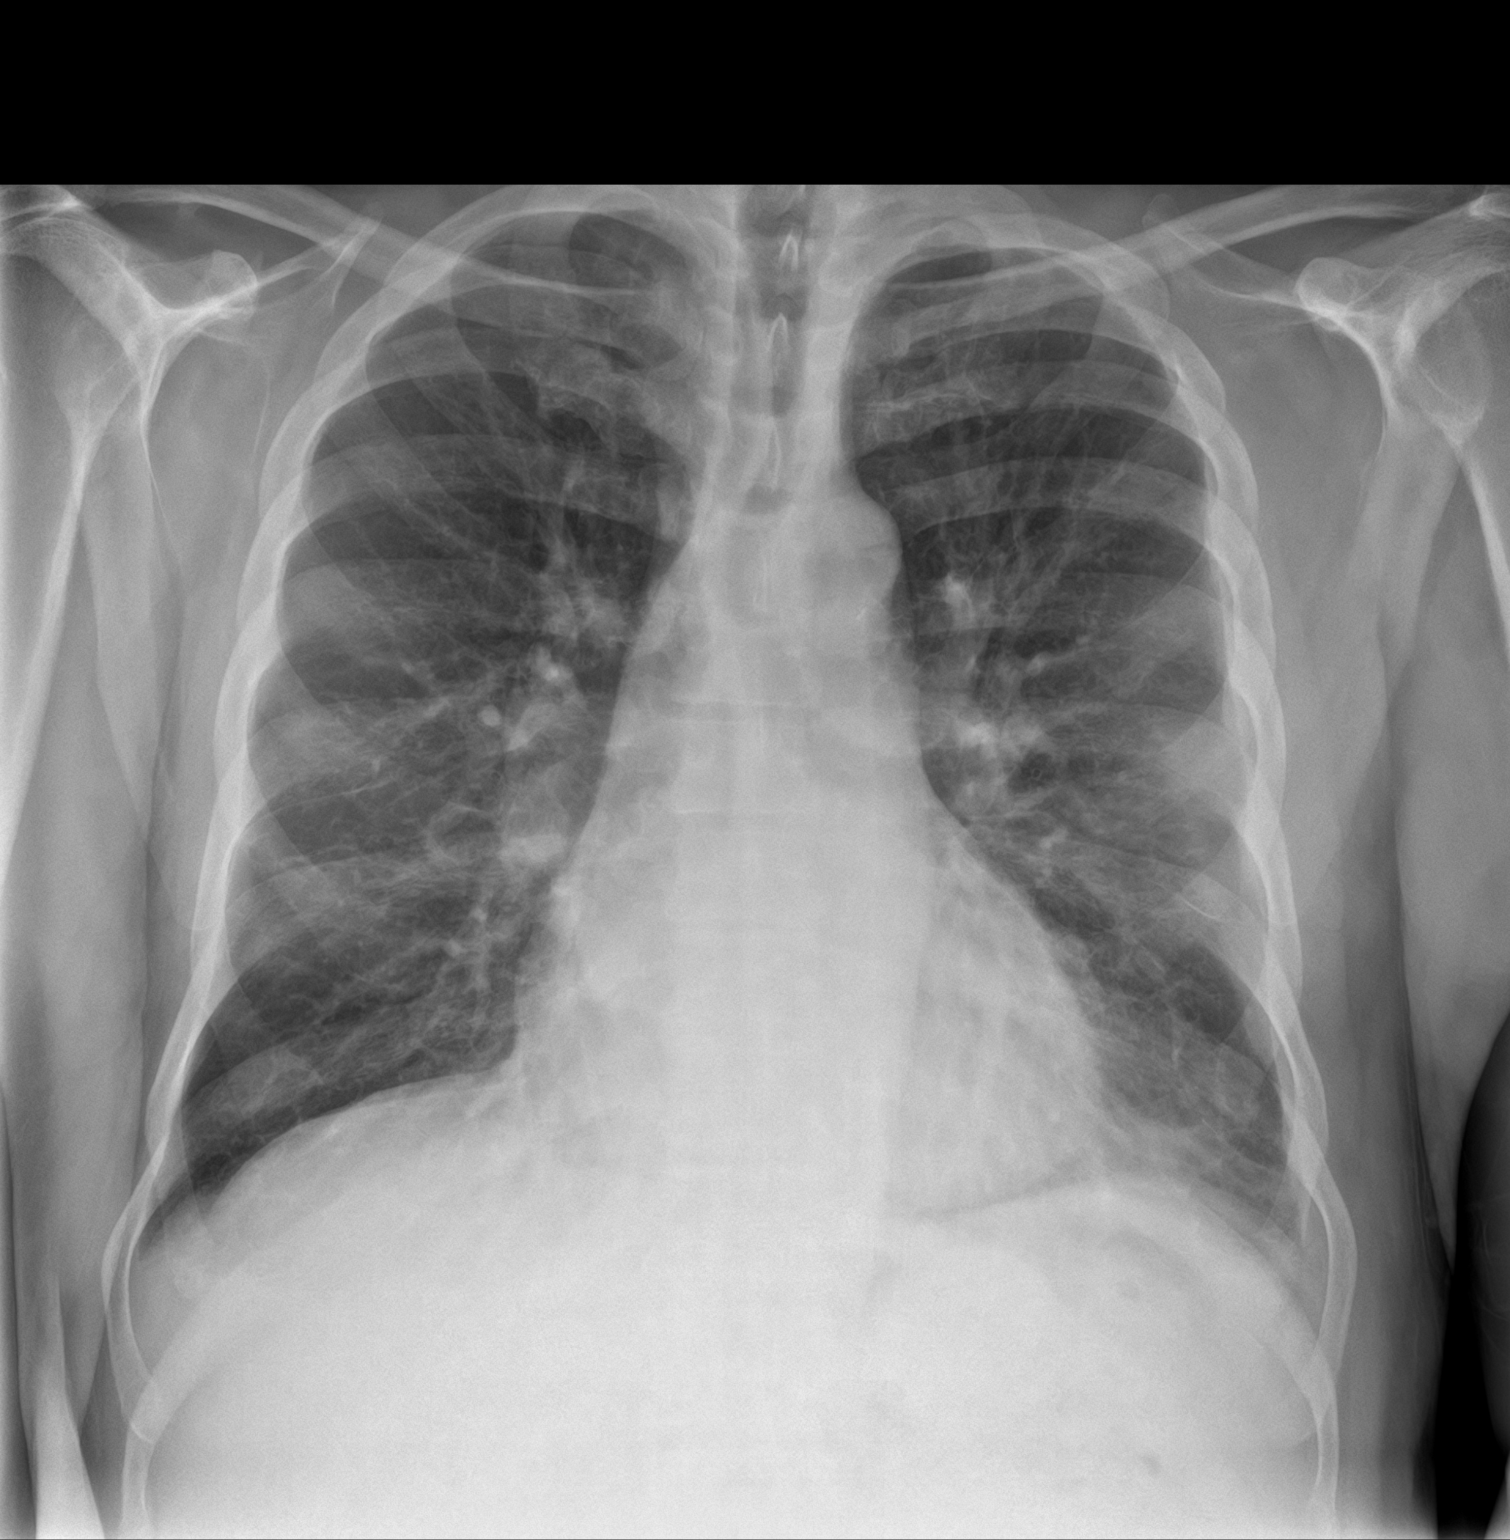

[chest lat]
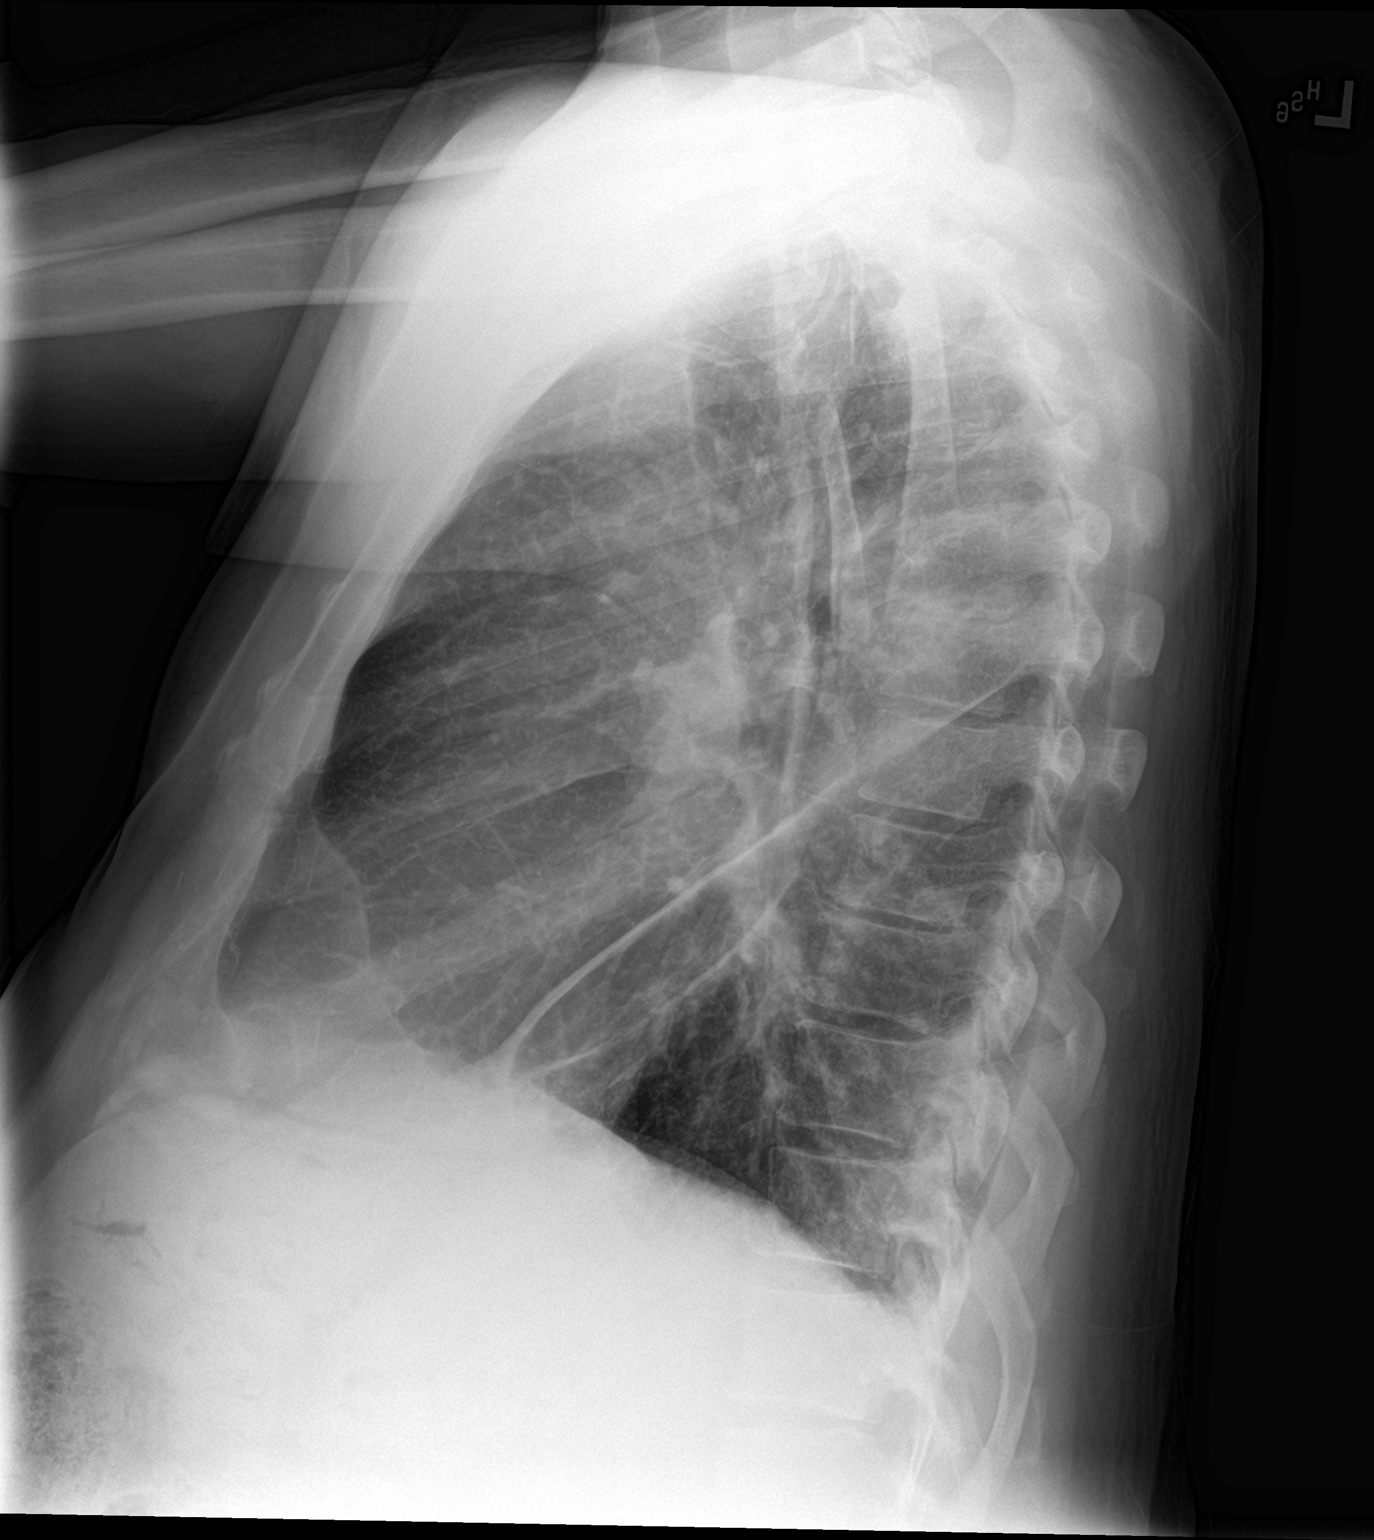

[2 of 2 positions shown; findings below may reference images not displayed]

FINDINGS: The lungs are well-aerated. Vascular congestion is noted. Mild left
basilar atelectasis or scarring is noted, stable from 0633. There is
no evidence of pleural effusion or pneumothorax.

The heart is normal in size; the mediastinal contour is within
normal limits. No acute osseous abnormalities are seen.
IMPRESSION: Vascular congestion noted; mild left basilar atelectasis or scarring
noted, stable from 0633.

## 2021-08-07 IMAGING — DX DG CHEST 1V PORT
1 series · 1 of 1 positions shown · non-contrast
Comparison: April 14, 2019

CLINICAL DATA: Shortness of breath.

EXAM:
PORTABLE CHEST 1 VIEW

[chest ap]
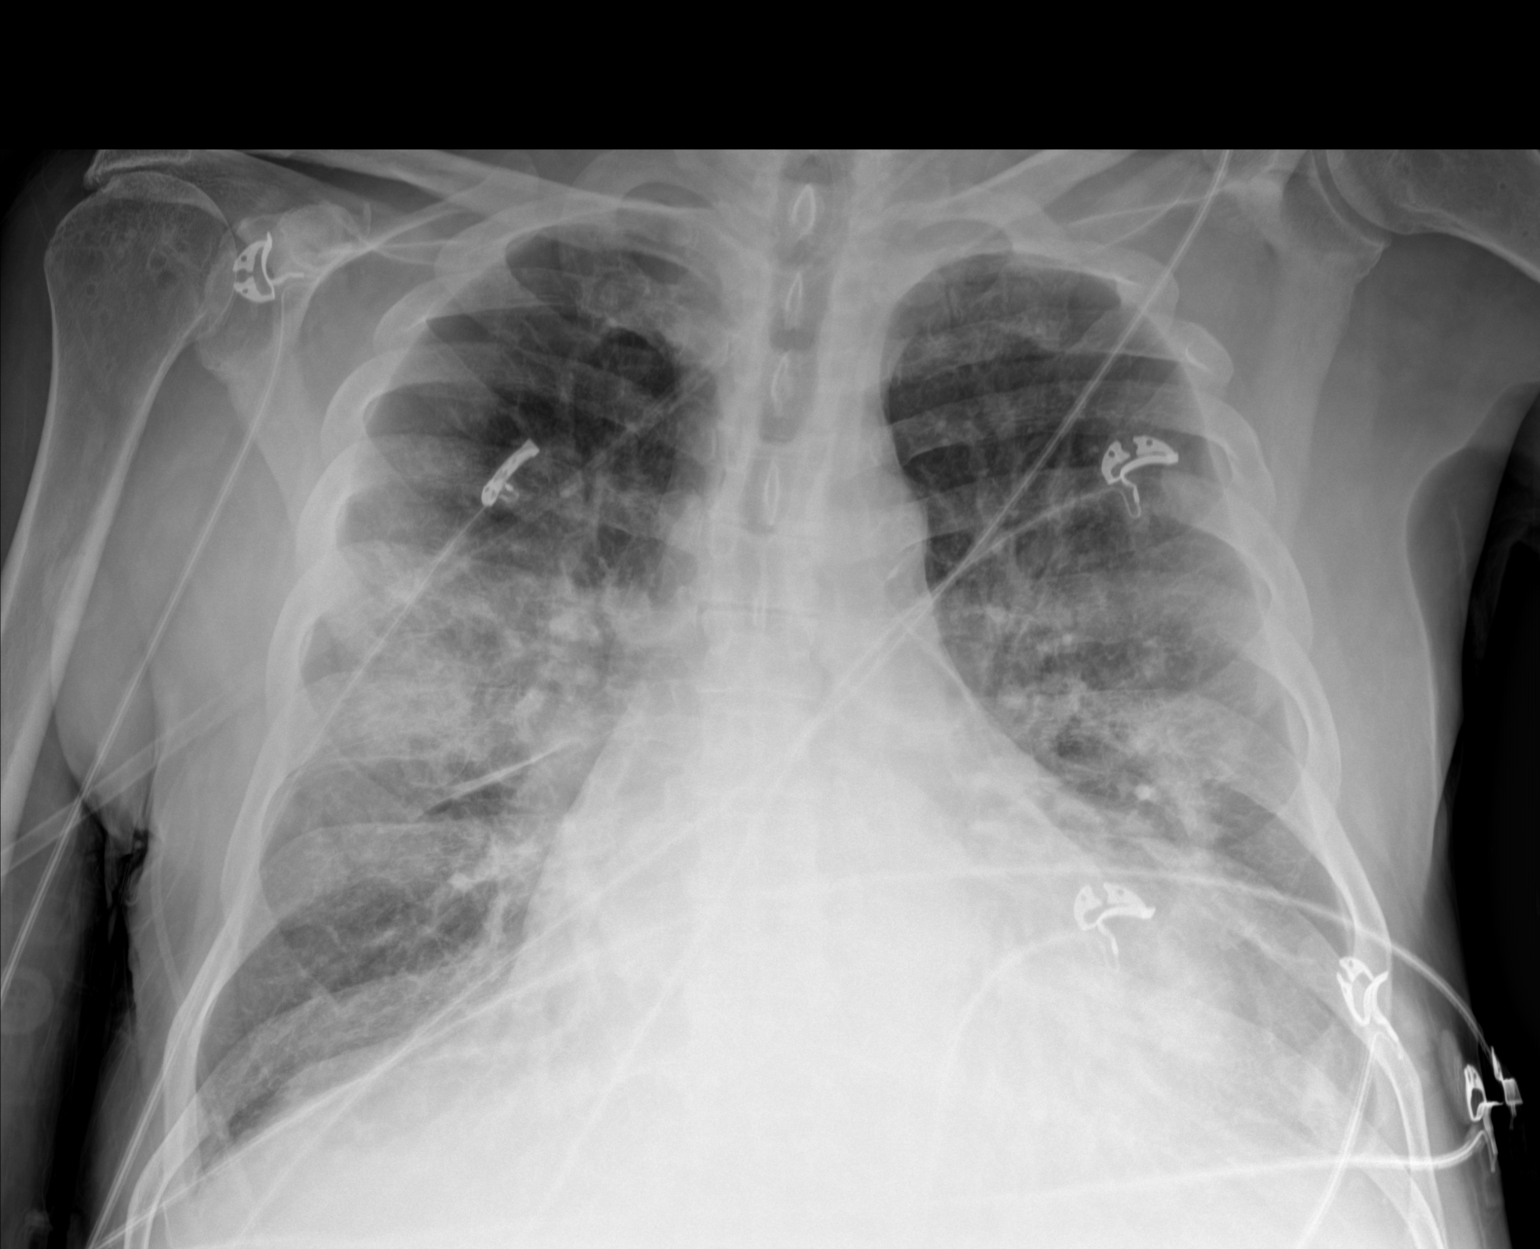

[1 of 1 positions shown; findings below may reference images not displayed]

FINDINGS: There is been interval development of bilateral airspace opacities
primarily in the mid lung zones bilaterally. The heart size remains
enlarged. There is persistent blunting of the costophrenic angles.
There is no acute osseous abnormality. Aortic calcifications are
noted. The lungs appear to be somewhat hyperexpanded.
IMPRESSION: 1. Interval development of bilateral airspace opacities concerning
for developing multifocal pneumonia. Pulmonary edema can have a
similar appearance in the appropriate clinical setting.
2. Hyperexpanded lungs, similar to prior study.
3. Persistent blunting of the costophrenic angles which may be
secondary to small bilateral pleural effusions and or atelectasis.
